# Patient Record
Sex: Male | Born: 1975 | Race: Black or African American | Hispanic: No | Marital: Married | Smoking: Former smoker
Health system: Southern US, Community
[De-identification: ages and names within clinical notes are randomized; demographics above are authoritative.]

## PROBLEM LIST (undated history)

## (undated) DIAGNOSIS — R778 Other specified abnormalities of plasma proteins: Secondary | ICD-10-CM

## (undated) DIAGNOSIS — N182 Chronic kidney disease, stage 2 (mild): Secondary | ICD-10-CM

## (undated) DIAGNOSIS — E119 Type 2 diabetes mellitus without complications: Secondary | ICD-10-CM

## (undated) DIAGNOSIS — Z9989 Dependence on other enabling machines and devices: Secondary | ICD-10-CM

## (undated) DIAGNOSIS — Z888 Allergy status to other drugs, medicaments and biological substances status: Secondary | ICD-10-CM

## (undated) DIAGNOSIS — I472 Ventricular tachycardia, unspecified: Secondary | ICD-10-CM

## (undated) DIAGNOSIS — F32A Depression, unspecified: Secondary | ICD-10-CM

## (undated) DIAGNOSIS — J45909 Unspecified asthma, uncomplicated: Secondary | ICD-10-CM

## (undated) DIAGNOSIS — E7800 Pure hypercholesterolemia, unspecified: Secondary | ICD-10-CM

## (undated) DIAGNOSIS — I10 Essential (primary) hypertension: Secondary | ICD-10-CM

## (undated) DIAGNOSIS — F419 Anxiety disorder, unspecified: Secondary | ICD-10-CM

## (undated) DIAGNOSIS — I428 Other cardiomyopathies: Secondary | ICD-10-CM

## (undated) DIAGNOSIS — I5022 Chronic systolic (congestive) heart failure: Secondary | ICD-10-CM

## (undated) DIAGNOSIS — N200 Calculus of kidney: Secondary | ICD-10-CM

## (undated) DIAGNOSIS — R7989 Other specified abnormal findings of blood chemistry: Secondary | ICD-10-CM

## (undated) DIAGNOSIS — G4733 Obstructive sleep apnea (adult) (pediatric): Secondary | ICD-10-CM

## (undated) DIAGNOSIS — C649 Malignant neoplasm of unspecified kidney, except renal pelvis: Secondary | ICD-10-CM

## (undated) DIAGNOSIS — I480 Paroxysmal atrial fibrillation: Secondary | ICD-10-CM

## (undated) HISTORY — DX: Depression, unspecified: F32.A

## (undated) HISTORY — DX: Other cardiomyopathies: I42.8

## (undated) HISTORY — DX: Calculus of kidney: N20.0

## (undated) HISTORY — DX: Paroxysmal atrial fibrillation: I48.0

## (undated) HISTORY — PX: VASECTOMY: SHX75

## (undated) NOTE — *Deleted (*Deleted)
***In Progress*** Primary Care Provider:Stroud, Rolm Gala, FNP Primary Cardiologist:P. Tenny Craw, MD  Primary HF Cardiologist: Dr. 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   Admitted to [facility] in 3/16 secondary to atypical chest pain, dyspnea, and mild troponin elevation. Echo during hospitalization shows "severely reduced LV function" - EF not quantified due to poor windows.  Admitted 05/14/15 with CP at rest. [facility] showed normal coronaries and moderately elevated filling pressures with moderately depressed cardiac output. [employer] subcutaneous ICD was implanted  Admitted 9/4 - 06/13/17 with A/C systolic CHF in the setting of new afib. Started on IV furosemide and amiodarone. Pt diuresed and converted to NSR without requiring DCCV. Started on Eliquis for [facility]. Continued on amiodarone on discharge with wean over several weeks.  Admitted 3/27 - 01/01/18 with ICD shock. Electrolytes stable. Echo 12/31/17 with LVEF 25-30%.  Pt admitted 05/15/20 for S-ICD generator change. Course was complicated with acute hypoxic/hypercapnic respiratory failure due to possible flash pulmonary edema. Had traumatic intubation resulting in aspiration and requiring ventilator support and short term pressor support. On 05/16/20 pt stable on vent, off pressors. Pt generator change completed. Covered with ABX with concern for aspiration PNA with plan for Augmentin for 5 day course on discharge.  Presented forpost-hospitalfollow up on 05/29/20. Was feeling good. Reported walking 2.5 miles per day. Denied SOB, edema, orthopnea or PND.  Recently returned to HF clinic on 06/26/20 for pharmacist medication titration. At last visit with MD, amiodarone was decreased to 100 mg daily and carvedilol was increased to 6.25 mg BID. Unfortunately, the  correct dose was not sent to the pharmacy, so he was still taking carvedilol 3.125 mg BID. Overall he was feeling well. No dizziness, lightheadedness, chest pain or palpitations. No SOB/DOE. He was taking torsemide 40 mg daily and had not needed any extra. His weight was stable at home at 320-325 lbs. No LEE, PND or orthopnea. Appetite was good. Followed a low salt diet. Recently started on a prednisone taper by his PCP.   Today he returns to HF clinic for pharmacist medication titration. At last visit with pharmacist, Bidil was increased to 1.5 tablets TID.   Overall feeling ***. Dizziness, lightheadedness, fatigue:  Chest pain or palpitations:  How is your breathing?: *** SOB: Able to complete all ADLs. Activity level ***  Weight at home pounds. Takes furosemide/torsemide/bumex *** mg *** daily.  LEE PND/Orthopnea  Appetite *** Low-salt diet:   Physical Exam Cost/affordability of meds  . Shortness of breath/dyspnea on exertion? {YES [id number]  . Orthopnea/PND? {YES [id number] . Edema? {YES [id number] . Lightheadedness/dizziness? {YES [id number] . Daily weights at home? {YES [id number] . Blood pressure/heart rate monitoring at home? {YES [id number] . Following low-sodium/fluid-restricted diet? {YES NO:22349}  HF Medications: Carvedilol 3.125 mg BID Entresto 97/103 mg BID Spironolactone 12.5 mg daily Bidil 20/37.5 mg 1.5 tablet TID Torsemide 40 mg daily  Has the patient been experiencing any side effects to the medications prescribed?  {YES NO:22349}  Does the patient have any problems obtaining medications due to transportation or finances?   No - Has BCBS Medicaid  Understanding of regimen: {excellent/good/fair/poor:19665} Understanding of indications: {excellent/good/fair/poor:19665} Potential of compliance: {excellent/good/fair/poor:19665} Patient understands to avoid NSAIDs. Patient understands to avoid decongestants.    Pertinent Lab Values: . Serum creatinine ***,  BUN ***, Potassium ***, Sodium ***, BNP ***, Magnesium ***, Digoxin ***  Vital Signs: . Weight: *** (last clinic weight: 326 lb) . Blood pressure: ***  . Heart rate: ***   Assessment: 1. Chronic systolic congestive heart failure: s/p [employer] subcutaneous ICD implant 05/17/15(gen change 8/21 - c/b traumatic intubation). 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  - Echo 12/31/17 LVEF 25-30%, Moderate MR, mild RV dilation, Severe RAE, Mod TR, Pa peak pressure 67 mm Hg.  - NYHA II, euvolemic on exam - Continue torsemide 40 mg daily  -Continue*** carvedilol ***3.125 mg BID.  - Continue Entresto 97/103 mg BID. Watch closely with solitary kidney.  - Continue spironolactone 12.5 daily - Continue Bidil 20-37.5 mg 1.5 tab TID  - Was asked for him to speak with PCP re addition of Farxiga, but he forgot at his most recent visit.***  2. Paroxysmal Afib - New diagnosis 06/2017. - Maintaining NSR - Continue Eliquis 5 mg BID.  -Continue amiodarone 100 daily.   - Will need yearly eye exams.  3. 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  No syncope or palpitations. 4. Hypertension:  - Stable.  5. CKD stage III -creatinine1.5-1.7.  6. OSA:  - Back on CPAP.  7. Tobacco use: - Former smoker. No change.  8. Gout:  - Continue allopurinol 200 daily for prophylaxis. No change.  9. DM2:  - Stable. Per PCP. 10.Renal Cell Carcinoma - S/P nephrectomy 2014.  Follow closely in setting of solitary kidney.  - Follows at [facility] and has follow up next month.  11. Morbid Obesity -Body mass index is 51.41 kg/m.   Plan: 1) Medication changes: Based on clinical presentation, vital signs and recent labs will *** 2) Labs: *** 3) Follow-up: ***   Karle Plumber, PharmD, BCPS, BCCP, CPP Heart Failure Clinic Pharmacist [phone]

## (undated) NOTE — *Deleted (*Deleted)
***In-Progress*** Primary Care Provider:Stroud, Rolm Gala, FNP Primary Cardiologist:P. 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   Admitted to [facility] in 3/16 secondary to atypical chest pain, dyspnea, and mild troponin elevation. Echo during hospitalization shows "severely reduced LV function" - EF not quantified due to poor windows.  Admitted 05/14/15 with CP at rest. [facility] showed normal coronaries and moderately elevated filling pressures with moderately depressed cardiac output. [employer] subcutaneous ICD was implanted  Admitted 9/4 - 06/13/17 with A/C systolic CHF in the setting of new afib. Started on IV furosemide and amiodarone. Patient diuresed and converted to NSR without requiring DCCV. Started on Eliquis for anticoagulation. Continued on amiodarone at discharge with wean over several weeks.  Admitted 3/27 - 01/01/18 with ICD shock. Electrolytes stable. Echo 12/31/17 with LVEF 25-30%.  Patient admitted 05/15/20 for S-ICD generator change. Course was complicated with acute hypoxic/hypercapnic respiratory failure due to possible flash pulmonary edema. Had traumatic intubation resulting in aspiration and requiring ventilator support and short term pressor support. On 05/16/20 patient stable on vent, off pressors. Patient generator change completed. Covered with ABX with concern for aspiration PNA with plan for Augmentin for 5 day course on discharge.  Recently returned forpost-hospitalfollow up on 05/29/20 with Dr. Jones Broom. Was feeling good. Reported walking 2.5 miles per day. Denied SOB, edema, orthopnea or PND.  Today he returns to HF clinic for pharmacist medication titration. At last visit with pharmacy clinic 06/26/20, BiDil was increased to 1.5 tablets TID.    Overall feeling ***. Dizziness, lightheadedness, fatigue:  Chest pain or palpitations.  How is your breathing?: *** SOB Able to complete all ADLs. Activity level ***  Weight at home pounds. Takes furosemide/torsemide/bumex *** mg *** daily.  PND/Orthopnea:   Appetite ***.   HF Medications: Carvedilol 3.125 mg BID Entresto 97/103 mg BID Spironolactone 12.5 mg daily BiDil 20-37.5 mg 1.5 tablets TID Torsemide 40 mg daily  ***Has the patient been experiencing any side effects to the medications prescribed?  no  Does the patient have any problems obtaining medications due to transportation or finances?   No - Has BCBS Medicaid  Understanding of regimen: good Understanding of indications: good Potential of compliance: good Patient understands to avoid NSAIDs. Patient understands to avoid decongestants.   Pertinent Lab Values 05/17/20:  Serum creatinine 1.54, BUN 25, Potassium 4.1, Sodium 139  Vital Signs:  Weight:  lbs (last clinic weight: 326 lbs)  Blood pressure:   Heart rate:    Assessment: 1. Chronic systolic congestive heart failure: s/p [employer] subcutaneous ICD implant 05/17/15(gen change 8/21 - c/b traumatic intubation). 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  - Echo 12/31/17 LVEF 25-30%, Moderate MR, mild RV dilation, Severe RAE, Mod TR, Pa peak pressure 67 mm Hg.  - Echo 06/06/20 LVEF 20-25%, LV with global HK. RV function normal - NYHA II, euvolemic on exam - Continue torsemide 40 mg daily  -Continue carvedilol 3.125 mg BID. - Continue Entresto 97/103 mg BID. Watch closely with solitary kidney.  - Continue spironolactone 12.5 daily - Continue Bidil 20-37.5 mg to 1.5 tab TID  - Was asked to speak with PCP regarding addition of Farxiga, but he forgot at his most recent visit***  2. Paroxysmal Afib - New diagnosis 06/2017. - Maintaining NSR - Continue Eliquis 5 mg BID.  -Continue amiodarone 100  daily.   - Will need yearly eye  exams.  3. VT with ICD shock 12/29/17 -Continue amiodarone 100 mg daily.  No syncope or palpitations. 4. Hypertension***:  - Stable.  5. CKD stage III -creatinine1.5-1.7.  6. OSA:  - Back on CPAP.  7. Tobacco use: - Former smoker. No change.  8. Gout:  - Continue allopurinol 200 daily for prophylaxis. No change.  9. DM2:  - Stable. Per PCP. 10. Renal Cell Carcinoma - S/P nephrectomy 2014.  -Follow closely in setting of solitary kidney.  - Follows at [facility] and has follow up next month.  11. Morbid Obesity -Body mass index is 51.41 kg/m.   Plan: 1) Medication changes: Based on clinical presentation, vital signs and recent labs will  *** 2) Follow-up in 4 weeks with Pharmacy Clinic

---

## 1999-04-18 ENCOUNTER — Emergency Department (HOSPITAL_COMMUNITY): Admission: EM | Admit: 1999-04-18 | Discharge: 1999-04-18 | Payer: Self-pay | Admitting: Emergency Medicine

## 2001-07-12 ENCOUNTER — Emergency Department (HOSPITAL_COMMUNITY): Admission: EM | Admit: 2001-07-12 | Discharge: 2001-07-12 | Payer: Self-pay | Admitting: Emergency Medicine

## 2004-01-16 ENCOUNTER — Observation Stay (HOSPITAL_COMMUNITY): Admission: EM | Admit: 2004-01-16 | Discharge: 2004-01-17 | Payer: Self-pay | Admitting: Family Medicine

## 2004-07-05 HISTORY — PX: APPENDECTOMY: SHX54

## 2004-07-11 ENCOUNTER — Encounter (INDEPENDENT_AMBULATORY_CARE_PROVIDER_SITE_OTHER): Payer: Self-pay | Admitting: Specialist

## 2004-07-11 ENCOUNTER — Inpatient Hospital Stay (HOSPITAL_COMMUNITY): Admission: EM | Admit: 2004-07-11 | Discharge: 2004-07-24 | Payer: Self-pay | Admitting: Emergency Medicine

## 2004-08-03 ENCOUNTER — Emergency Department (HOSPITAL_COMMUNITY): Admission: EM | Admit: 2004-08-03 | Discharge: 2004-08-04 | Payer: Self-pay | Admitting: Emergency Medicine

## 2007-09-09 ENCOUNTER — Emergency Department (HOSPITAL_COMMUNITY): Admission: EM | Admit: 2007-09-09 | Discharge: 2007-09-09 | Payer: Self-pay | Admitting: Emergency Medicine

## 2007-10-17 ENCOUNTER — Emergency Department (HOSPITAL_COMMUNITY): Admission: EM | Admit: 2007-10-17 | Discharge: 2007-10-17 | Payer: Self-pay | Admitting: Emergency Medicine

## 2008-02-03 ENCOUNTER — Emergency Department (HOSPITAL_COMMUNITY): Admission: EM | Admit: 2008-02-03 | Discharge: 2008-02-04 | Payer: Self-pay | Admitting: Emergency Medicine

## 2009-11-12 ENCOUNTER — Emergency Department (HOSPITAL_COMMUNITY): Admission: EM | Admit: 2009-11-12 | Discharge: 2009-11-13 | Payer: Self-pay | Admitting: Emergency Medicine

## 2010-04-26 ENCOUNTER — Emergency Department (HOSPITAL_COMMUNITY): Admission: EM | Admit: 2010-04-26 | Discharge: 2010-04-26 | Payer: Self-pay | Admitting: Emergency Medicine

## 2010-10-15 ENCOUNTER — Inpatient Hospital Stay (HOSPITAL_COMMUNITY)
Admission: EM | Admit: 2010-10-15 | Discharge: 2010-10-18 | Payer: Self-pay | Source: Home / Self Care | Attending: Internal Medicine | Admitting: Internal Medicine

## 2010-10-15 ENCOUNTER — Encounter (INDEPENDENT_AMBULATORY_CARE_PROVIDER_SITE_OTHER): Payer: Self-pay | Admitting: Nurse Practitioner

## 2010-10-15 LAB — CONVERTED CEMR LAB
Hgb A1c MFr Bld: 6 %
TSH: 2.961 u[IU]/mL

## 2010-10-16 ENCOUNTER — Encounter (INDEPENDENT_AMBULATORY_CARE_PROVIDER_SITE_OTHER): Payer: Self-pay | Admitting: Nurse Practitioner

## 2010-10-16 ENCOUNTER — Encounter (INDEPENDENT_AMBULATORY_CARE_PROVIDER_SITE_OTHER): Payer: Self-pay | Admitting: Internal Medicine

## 2010-10-16 LAB — CONVERTED CEMR LAB
ALT: 90 units/L
AST: 38 units/L
Albumin: 3.1 g/dL
Alkaline Phosphatase: 41 units/L
BUN: 15 mg/dL
CO2: 25 meq/L
Calcium: 9.3 mg/dL
Chloride: 106 meq/L
Cholesterol: 167 mg/dL
Creatinine, Ser: 1.41 mg/dL
Glucose, Bld: 114 mg/dL
HDL: 27 mg/dL
LDL Cholesterol: 106 mg/dL
Potassium: 4.2 meq/L
Sodium: 139 meq/L
Total Bilirubin: 0.8 mg/dL
Total CHOL/HDL Ratio: 6.2
Total Protein: 6.4 g/dL
Triglycerides: 172 mg/dL
VLDL: 34 mg/dL

## 2010-10-20 LAB — COMPREHENSIVE METABOLIC PANEL
ALT: 113 U/L — ABNORMAL HIGH (ref 0–53)
ALT: 90 U/L — ABNORMAL HIGH (ref 0–53)
AST: 79 U/L — ABNORMAL HIGH (ref 0–37)
AST: 38 U/L — ABNORMAL HIGH (ref 0–37)
Albumin: 3.1 g/dL — ABNORMAL LOW (ref 3.5–5.2)
Albumin: 3.1 g/dL — ABNORMAL LOW (ref 3.5–5.2)
Alkaline Phosphatase: 37 U/L — ABNORMAL LOW (ref 39–117)
Alkaline Phosphatase: 41 U/L (ref 39–117)
BUN: 17 mg/dL (ref 6–23)
BUN: 15 mg/dL (ref 6–23)
CO2: 25 mEq/L (ref 19–32)
CO2: 25 mEq/L (ref 19–32)
Calcium: 9.1 mg/dL (ref 8.4–10.5)
Calcium: 9.3 mg/dL (ref 8.4–10.5)
Chloride: 108 mEq/L (ref 96–112)
Chloride: 106 mEq/L (ref 96–112)
Creatinine, Ser: 1.4 mg/dL (ref 0.4–1.5)
Creatinine, Ser: 1.41 mg/dL (ref 0.4–1.5)
GFR calc Af Amer: >60 mL/min (ref >60)
GFR calc Af Amer: >60 mL/min (ref >60)
GFR calc non Af Amer: 58 mL/min — ABNORMAL LOW (ref >60)
GFR calc non Af Amer: 57 mL/min — ABNORMAL LOW (ref >60)
Glucose, Bld: 108 mg/dL — ABNORMAL HIGH (ref 70–99)
Glucose, Bld: 114 mg/dL — ABNORMAL HIGH (ref 70–99)
Potassium: 4.3 mEq/L (ref 3.5–5.1)
Potassium: 4.2 mEq/L (ref 3.5–5.1)
Sodium: 141 mEq/L (ref 135–145)
Sodium: 139 mEq/L (ref 135–145)
Total Bilirubin: 1 mg/dL (ref 0.3–1.2)
Total Bilirubin: 0.8 mg/dL (ref 0.3–1.2)
Total Protein: 6.3 g/dL (ref 6.0–8.3)
Total Protein: 6.4 g/dL (ref 6.0–8.3)

## 2010-10-20 LAB — URINALYSIS, ROUTINE W REFLEX MICROSCOPIC
Bilirubin Urine: NEGATIVE
Hgb urine dipstick: NEGATIVE
Ketones, ur: NEGATIVE mg/dL
Leukocytes, UA: NEGATIVE
Nitrite: NEGATIVE
Protein, ur: 100 mg/dL — AB
Specific Gravity, Urine: 1.024 (ref 1.005–1.030)
Urine Glucose, Fasting: NEGATIVE mg/dL
Urobilinogen, UA: 0.2 mg/dL (ref 0.0–1.0)
pH: 5.5 (ref 5.0–8.0)

## 2010-10-20 LAB — DIFFERENTIAL
Basophils Absolute: 0 10*3/uL (ref 0.0–0.1)
Basophils Relative: 1 % (ref 0–1)
Eosinophils Absolute: 0.2 10*3/uL (ref 0.0–0.7)
Eosinophils Relative: 3 % (ref 0–5)
Lymphocytes Relative: 41 % (ref 12–46)
Lymphs Abs: 3.5 10*3/uL (ref 0.7–4.0)
Monocytes Absolute: 0.6 10*3/uL (ref 0.1–1.0)
Monocytes Relative: 7 % (ref 3–12)
Neutro Abs: 4.3 10*3/uL (ref 1.7–7.7)
Neutrophils Relative %: 49 % (ref 43–77)

## 2010-10-20 LAB — TSH: TSH: 2.961 u[IU]/mL (ref 0.350–4.500)

## 2010-10-20 LAB — BASIC METABOLIC PANEL
BUN: 15 mg/dL (ref 6–23)
CO2: 22 mEq/L (ref 19–32)
Calcium: 9 mg/dL (ref 8.4–10.5)
Chloride: 112 mEq/L (ref 96–112)
Creatinine, Ser: 1.32 mg/dL (ref 0.4–1.5)
GFR calc Af Amer: >60 mL/min (ref >60)
GFR calc non Af Amer: >60 mL/min (ref >60)
Glucose, Bld: 111 mg/dL — ABNORMAL HIGH (ref 70–99)
Potassium: 4.2 mEq/L (ref 3.5–5.1)
Sodium: 140 mEq/L (ref 135–145)

## 2010-10-20 LAB — BRAIN NATRIURETIC PEPTIDE
Pro B Natriuretic peptide (BNP): 644 pg/mL — ABNORMAL HIGH (ref 0.0–100.0)
Pro B Natriuretic peptide (BNP): 373 pg/mL — ABNORMAL HIGH (ref 0.0–100.0)
Pro B Natriuretic peptide (BNP): 386 pg/mL — ABNORMAL HIGH (ref 0.0–100.0)

## 2010-10-20 LAB — HEMOGLOBIN A1C
Hgb A1c MFr Bld: 6 % — ABNORMAL HIGH (ref <5.7)
Mean Plasma Glucose: 126 mg/dL — ABNORMAL HIGH (ref <117)

## 2010-10-20 LAB — CARDIAC PANEL(CRET KIN+CKTOT+MB+TROPI)
CK, MB: 1.7 ng/mL (ref 0.3–4.0)
CK, MB: 1.6 ng/mL (ref 0.3–4.0)
Relative Index: 1 (ref 0.0–2.5)
Relative Index: 1 (ref 0.0–2.5)
Total CK: 162 U/L (ref 7–232)
Total CK: 166 U/L (ref 7–232)
Troponin I: 0.06 ng/mL (ref 0.00–0.06)
Troponin I: 0.08 ng/mL — ABNORMAL HIGH (ref 0.00–0.06)

## 2010-10-20 LAB — CBC
HCT: 43.8 % (ref 39.0–52.0)
HCT: 42.2 % (ref 39.0–52.0)
Hemoglobin: 14.5 g/dL (ref 13.0–17.0)
Hemoglobin: 14.2 g/dL (ref 13.0–17.0)
MCH: 27.9 pg (ref 26.0–34.0)
MCH: 28.2 pg (ref 26.0–34.0)
MCHC: 33.1 g/dL (ref 30.0–36.0)
MCHC: 33.6 g/dL (ref 30.0–36.0)
MCV: 84.2 fL (ref 78.0–100.0)
MCV: 83.9 fL (ref 78.0–100.0)
Platelets: 259 10*3/uL (ref 150–400)
Platelets: 260 10*3/uL (ref 150–400)
RBC: 5.2 MIL/uL (ref 4.22–5.81)
RBC: 5.03 MIL/uL (ref 4.22–5.81)
RDW: 14 % (ref 11.5–15.5)
RDW: 14 % (ref 11.5–15.5)
WBC: 8.6 10*3/uL (ref 4.0–10.5)
WBC: 10 10*3/uL (ref 4.0–10.5)

## 2010-10-20 LAB — RAPID URINE DRUG SCREEN, HOSP PERFORMED
Amphetamines: NOT DETECTED
Barbiturates: NOT DETECTED
Benzodiazepines: NOT DETECTED
Cocaine: POSITIVE — AB
Opiates: NOT DETECTED
Tetrahydrocannabinol: NOT DETECTED

## 2010-10-20 LAB — URINE MICROSCOPIC-ADD ON

## 2010-10-20 LAB — LIPID PANEL
Cholesterol: 167 mg/dL (ref 0–200)
HDL: 27 mg/dL — ABNORMAL LOW (ref >39)
LDL Cholesterol: 106 mg/dL — ABNORMAL HIGH (ref 0–99)
Total CHOL/HDL Ratio: 6.2 RATIO
Triglycerides: 172 mg/dL — ABNORMAL HIGH (ref <150)
VLDL: 34 mg/dL (ref 0–40)

## 2010-10-20 LAB — POCT CARDIAC MARKERS
CKMB, poc: 1.6 ng/mL (ref 1.0–8.0)
Myoglobin, poc: 67.8 ng/mL (ref 12–200)
Troponin i, poc: <0.05 ng/mL (ref 0.00–0.09)

## 2010-10-20 LAB — GLUCOSE, CAPILLARY: Glucose-Capillary: 116 mg/dL — ABNORMAL HIGH (ref 70–99)

## 2010-11-16 NOTE — H&P (Signed)
Jeffrey [name], Jeffrey [name]                  ACCOUNT NO.:  [id number]  MEDICAL RECORD NO.:  [id number]          PATIENT TYPE:  INP  LOCATION:  0110                         FACILITY:  [facility]  PHYSICIAN:  Marcellus Scott, MD     DATE OF BIRTH:  04/04/1976  DATE OF ADMISSION:  10/15/2010 DATE OF DISCHARGE:                             HISTORY & PHYSICAL   CHIEF COMPLAINT:  Shortness of breath.  HISTORY OF PRESENT ILLNESS:  This is an obese African American 35 year old male with a history of untreated hypertension who reports that he has been chronically short of breath for months but ignored it thinking that he was just fat.  However, over the past 4 days, he has been having shortness of breath even lying still and is unable to get up and walk across a room secondary to dyspnea.  He denies any chest pain.  He endorses occasional palpitations that he has had for years.  Denies any nausea, vomiting, changes in his bowel habits, fever or sore throat.  He does endorse a hacking nonproductive cough.  Denies PND.  Denies orthopnea.  He tells me that he has been told by his girlfriend that he occasionally stops breathing while he is sleeping.  PAST MEDICAL HISTORY:  Significant for: 1. Hypertension. 2. History of polysubstance abuse.  PAST SURGICAL HISTORY:  Surgeries have included appendectomy of a perforated abdomen with peritonitis.  He also had a postop abscess with partial small-bowel obstructions.  HOME MEDICATIONS:  None.  ALLERGIES:  Aspirin causes itching and rash.  REVIEW OF SYSTEMS:  Pertinent for pain in his antecubital area bilaterally.  Otherwise all systems were reviewed and found to be negative.  SOCIAL HISTORY:  He quit smoking 1-1/2 months ago but has a 10-pack-year history.  He tells me he used to drink on weekends.  He would drink 3 or 4 brandies on Friday and Saturday night but he has not had any alcohol in 3 weeks.  He tells me he last smoked marijuana over 2 years ago  and denies ever having used cocaine, although there is a positive UDS for cocaine in the e-chart.  He has 12 children.  FAMILY HISTORY:  Significant for his mother who is alive but was recently diagnosed with cancer; he is unsure what type.  His father who is alive and healthy.  He has one aunt and one uncle who both have had cardiomegaly and hypertension and are deceased from diverticulitis.  He has one sister who is healthy.  PHYSICAL EXAMINATION:  GENERAL:  This is a well-developed, obese, pleasant African American male lying in minimal distress in the [facility].   Vital Signs: T 98.4, BP: 159/137 mmHg on arrival but later 132/90, Pulse 91/min, Resp 22/min, O2 sats 97% on room air. He is alert and oriented x3.  HEENT:  Head is atraumatic, normocephalic.  Eyes are anicteric with pupils that are equal, round, reactive to light.  His nose shows no nasal discharge or exterior lesions.  Mouth has moist mucous membranes with good dentition.  NECK: Supple with midline trachea.  No JVD.  No lymphadenopathy.  CHEST:  Mild work of breathing without wheezes, crackles or rales.  HEART:  Regular rate and rhythm without murmurs, rubs or gallops.  ABDOMEN:  Obese, nontender.  He has a well-healed scar just over his umbilicus, I am unable to palpate any masses or organomegaly.  EXTREMITIES:  Lower extremities show 1+ pitting edema bilaterally.  He has 5/5 strength in each extremity. SKIN:  Shows no rashes, bruises or lesions.  NEUROLOGIC:  Cranial nerves II-XII are grossly intact.   He has no facial asymmetries, no obvious focal neuro deficits.  PSYCHIATRIC:  The patient is alert and oriented. His demeanor is pleasant, cooperative.  His grooming is good.  PERTINENT LABORATORY AND X-RAY DATA:  BNP is 644.  Sodium 140, potassium 4.2, chloride 112, bicarb 22, glucose 111, BUN 15, creatinine 1.32, calcium 9.0. Cardiac enzymes, point-of-care markers, troponin I is 0.05, CK-MB 1.6, myoglobin 67.8.   Two-view chest x-ray shows: 1. Enlargement of cardiac silhouette with pulmonary vascular     congestion. 2. Peribronchial thickening with mild hazy interstitial prominence.  ASSESSMENT:  Dr.  Marcellus Scott has seen and examined the patient, collected history, reviewed his chart and spoken at length with the patient as well as the PA about case. 1. This is a 35 year old male with acute onset of dyspnea even at     rest.  Differential diagnosis includes:  Congestive heart failure,     chronic obstructive pulmonary disease, sleep apnea, pneumonia,     which I doubt at this point. 2. The patient has hypertension, uncontrolled. 3. Obesity. 4. The patient has no PCP or insurance.  PLAN: 1. With regards to his dyspnea, we will diurese him with IV Lasix.  To     gauge the function of his heart, we will check a 2-D echo.  Will     also cycle cardiac enzymes to rule out acute coronary syndrome.     The patient is unable to take aspirin.  We may consider Plavix     based on the results of his 2-D echo and after discussions with the     cardiologist.  Will also ask for daily weights, strict I's and O's,     heart healthy diet and CHF education.  Finally, we will check a     chest x-ray in the morning to see if his vascular congestion is     improved on Lasix. 2. With regards to his hypertension, we are obtaining a UDS.  If the     results are negative for cocaine use, we will start metoprolol low     dose, 12.5 mg b.i.d.  We will consider ARB or ACE inhibitor later     in the hospitalization. his Bp's are better after sublingual Nitroglycerin. 3. For his recent polysubstance abuse, we will ask for tobacco and     substance abuse cessation counseling. 4. Prior to discharge, we will ask for social work assistance in     obtaining a PCP for this patient. 5. This patient is a full code. 6. Rule out obstructive sleep apnea,consider outpatient sleep study.  Dictated For:  Marcellus Scott,  MD     Stephani Police, [place]   ______________________________ Marcellus Scott, MD    MLY/MEDQ  D:  10/15/2010  T:  10/15/2010  Job:  [id number]  Electronically Signed by Algis Downs PA on 10/21/2010 82:95:62 PM Electronically Signed by Marcellus Scott MD on 11/16/2010 05:54:03 PM

## 2010-12-20 LAB — URINALYSIS, ROUTINE W REFLEX MICROSCOPIC
Bilirubin Urine: NEGATIVE
Glucose, UA: NEGATIVE mg/dL
Ketones, ur: NEGATIVE mg/dL
Leukocytes, UA: NEGATIVE
Nitrite: NEGATIVE
Protein, ur: 30 mg/dL — AB
Specific Gravity, Urine: 1.023 (ref 1.005–1.030)
Urobilinogen, UA: 0.2 mg/dL (ref 0.0–1.0)
pH: 6 (ref 5.0–8.0)

## 2010-12-20 LAB — URINE MICROSCOPIC-ADD ON

## 2010-12-23 ENCOUNTER — Encounter (INDEPENDENT_AMBULATORY_CARE_PROVIDER_SITE_OTHER): Payer: Self-pay | Admitting: Nurse Practitioner

## 2010-12-23 ENCOUNTER — Encounter: Payer: Self-pay | Admitting: Nurse Practitioner

## 2010-12-23 LAB — CONVERTED CEMR LAB
ALT: 33 units/L (ref 0–53)
AST: 23 units/L (ref 0–37)
Albumin: 4.6 g/dL (ref 3.5–5.2)
Alkaline Phosphatase: 45 units/L (ref 39–117)
BUN: 16 mg/dL (ref 6–23)
CO2: 25 meq/L (ref 19–32)
Calcium: 10 mg/dL (ref 8.4–10.5)
Chloride: 105 meq/L (ref 96–112)
Cholesterol, target level: 200 mg/dL
Creatinine, Ser: 0.94 mg/dL (ref 0.40–1.50)
Glucose, Bld: 127 mg/dL — ABNORMAL HIGH (ref 70–99)
HDL goal, serum: 40 mg/dL
LDL Goal: 130 mg/dL
Potassium: 4.6 meq/L (ref 3.5–5.3)
Pro B Natriuretic peptide (BNP): 28.7 pg/mL (ref 0.0–100.0)
Sodium: 139 meq/L (ref 135–145)
Total Bilirubin: 0.5 mg/dL (ref 0.3–1.2)
Total Protein: 8.1 g/dL (ref 6.0–8.3)

## 2010-12-24 ENCOUNTER — Encounter (INDEPENDENT_AMBULATORY_CARE_PROVIDER_SITE_OTHER): Payer: Self-pay | Admitting: Nurse Practitioner

## 2010-12-24 LAB — POCT I-STAT, CHEM 8
BUN: 18 mg/dL (ref 6–23)
Calcium, Ion: 1.11 mmol/L — ABNORMAL LOW (ref 1.12–1.32)
Chloride: 107 mEq/L (ref 96–112)
Creatinine, Ser: 1.3 mg/dL (ref 0.4–1.5)
Glucose, Bld: 97 mg/dL (ref 70–99)
HCT: 51 % (ref 39.0–52.0)
Hemoglobin: 17.3 g/dL — ABNORMAL HIGH (ref 13.0–17.0)
Potassium: 3.9 mEq/L (ref 3.5–5.1)
Sodium: 138 mEq/L (ref 135–145)
TCO2: 25 mmol/L (ref 0–100)

## 2010-12-24 LAB — POCT CARDIAC MARKERS
CKMB, poc: <1 ng/mL — ABNORMAL LOW (ref 1.0–8.0)
Myoglobin, poc: 63.7 ng/mL (ref 12–200)
Troponin i, poc: <0.05 ng/mL (ref 0.00–0.09)

## 2010-12-24 LAB — CBC
HCT: 46.5 % (ref 39.0–52.0)
Hemoglobin: 15.9 g/dL (ref 13.0–17.0)
MCHC: 34.3 g/dL (ref 30.0–36.0)
MCV: 89.8 fL (ref 78.0–100.0)
Platelets: 245 10*3/uL (ref 150–400)
RBC: 5.18 MIL/uL (ref 4.22–5.81)
RDW: 14.5 % (ref 11.5–15.5)
WBC: 9.8 10*3/uL (ref 4.0–10.5)

## 2010-12-25 DIAGNOSIS — E785 Hyperlipidemia, unspecified: Secondary | ICD-10-CM

## 2010-12-25 DIAGNOSIS — E1159 Type 2 diabetes mellitus with other circulatory complications: Secondary | ICD-10-CM

## 2010-12-25 DIAGNOSIS — E1169 Type 2 diabetes mellitus with other specified complication: Secondary | ICD-10-CM | POA: Insufficient documentation

## 2010-12-25 DIAGNOSIS — I10 Essential (primary) hypertension: Secondary | ICD-10-CM | POA: Insufficient documentation

## 2011-01-01 NOTE — Letter (Signed)
Summary: *HSN Results Follow up  [facility]  [address] [place], [place] [address]   Phone: [phone]  Fax: [phone]      12/24/2010   RANSOM NICKSON [address] [place], [place]  [address]   Dear  Mr. BLESSING OZGA,                            ____S.Drinkard,FNP   ____D. Gore,FNP       ____B. McPherson,MD   ____V. Rankins,MD    ____E. Mulberry,MD    __X__N. Daphine Deutscher, FNP  ____D. Reche Dixon, MD    ____K. Philipp Deputy, MD    ____Other     This letter is to inform you that your recent test(s):  _______Pap Smear    ___X____Lab Test     _______X-ray    ___X____ is within acceptable limits  _______ requires a medication change  _______ requires a follow-up lab visit  _______ requires a follow-up visit with your provider   Comments: Labs done during recent office visit were normal.  Keep up the good work.       _________________________________________________________ If you have any questions, please contact our office                     Sincerely,    Lehman Prom FNP [facility]

## 2011-01-01 NOTE — Assessment & Plan Note (Signed)
Summary: New - Establish Care   Vital Signs:  Patient profile:   35 year old male Height:      68.50 inches Weight:      306.6 pounds BMI:     46.11 O2 Sat:      96 % on Room air Temp:     97.4 degrees F oral Pulse rate:   75 / minute Pulse rhythm:   regular Resp:     20 per minute BP sitting:   120 / 86  (left arm) Cuff size:   regular  Vitals Entered By: Levon Hedger (December 23, 2010 11:36 AM)  Nutrition Counseling: Patient's BMI is greater than 25 and therefore counseled on weight management options.  O2 Flow:  Room air CC: follow-up visit [facility], CHF Management, Hypertension Management, Lipid Management Is Patient Diabetic? No Pain Assessment Patient in pain? no       Does patient need assistance? Functional Status Self care Ambulation Normal   CC:  follow-up visit [place], CHF Management, Hypertension Management, and Lipid Management.  History of Present Illness:  Pt into the office to establish care. Hospital D/C 10/15/2010 to 10/18/2010 (full d/c reviewed)  HTN - markedly elevated bp upon arrival to the ER.  He was treated in the ER and started on clonidine.  Therapy changed ad pt was stable on lopressor and lisinopril at time of D/C  CHF - treated with diuretics.  BNP 386.  Marginally high lipid profile - triglycerides 157 and ld; 1-6.  pt encouraged to change his diet and lose weight.    Borderline diabetic - Hgba1c 6.0.  Diet counseling given in hospital  Social - Due to most recent hospitalization he has had to quit his job  CHF History:      He denies headache, chest pain, palpitations, and peripheral edema.  Daily weights are not being checked.  He understands fluid management and sodium restriction and is following this regimen.  The patient expresses understanding of the treatment plan and his medications.  He admits to being compliant with his medications.  ADL's are being done without symptoms or restrictions.  The patient has not been  enrolled in the CHF Eduction Program.    Hypertension History:      He denies headache, chest pain, and palpitations.  He notes no problems with any antihypertensive medication side effects.        Positive major cardiovascular risk factors include hyperlipidemia and hypertension.  Negative major cardiovascular risk factors include male age less than 75 years old, no history of diabetes, and non-tobacco-user status.        Positive history for target organ damage include cardiac end organ damage (either CHF or LVH).  Further assessment for target organ damage reveals no history of stroke/TIA, peripheral vascular disease, renal insufficiency, or hypertensive retinopathy.    Lipid Management History:      Positive NCEP/ATP III risk factors include HDL cholesterol less than 40 and hypertension.  Negative NCEP/ATP III risk factors include male age less than 70 years old, non-diabetic, non-tobacco-user status, no prior stroke/TIA, no peripheral vascular disease, and no history of aortic aneurysm.        The patient states that he knows about the "Therapeutic Lifestyle Change" diet.  His compliance with the TLC diet is fair.  The patient expresses understanding of adjunctive measures for cholesterol lowering.  Comments include: no current meds.       Habits & Providers  Alcohol-Tobacco-Diet     Tobacco  Status: quit < 6 months     Tobacco Counseling: to quit use of tobacco products  Current Medications (verified): 1)  Lisinopril 10 Mg Tabs (Lisinopril) .... One Tablet By Mouth Daily 2)  Metoprolol Tartrate 50 Mg Tabs (Metoprolol Tartrate) .... One Tablet By Mouth Two Times A Day For Heart 3)  Furosemide 40 Mg Tabs (Furosemide) .... One Tablet By Mouth Two Times A Day For Fluid  Allergies (verified): No Known Drug Allergies  Family History: mother - htn (living) father - unknown  Social History: 10 children tobacco -  ETOH -  Drug - Smoking Status:  quit < 6 months  Review of  Systems General:  Denies fatigue. CV:  Denies chest pain or discomfort and swelling of feet. Resp:  Denies cough. GI:  Denies diarrhea, nausea, and vomiting.  Physical Exam  General:  alert.  obese Head:  normocephalic.   Lungs:  normal breath sounds.   Heart:  normal rate and regular rhythm.   Abdomen:  normal bowel sounds.   Msk:  up to the exam table Neurologic:  alert & oriented X3.   Skin:  color normal.   Psych:  Oriented X3.     Impression & Recommendations:  Problem # 1:  CHF (ICD-428.0) reviewed dx wtih pt His updated medication list for this problem includes:    Lisinopril 10 Mg Tabs (Lisinopril) ..... One tablet by mouth daily    Metoprolol Tartrate 50 Mg Tabs (Metoprolol tartrate) ..... One tablet by mouth two times a day for heart    Furosemide 40 Mg Tabs (Furosemide) ..... One tablet by mouth two times a day for fluid  Orders: T-Comprehensive Metabolic Panel (54098-11914) TLB-BNP (B-Natriuretic Peptide) (83880-BNPR) Pulse Oximetry (single measurment) (78295)  Problem # 2:  HYPERTENSION, BENIGN ESSENTIAL (ICD-401.1)  His updated medication list for this problem includes:    Lisinopril 10 Mg Tabs (Lisinopril) ..... One tablet by mouth daily    Metoprolol Tartrate 50 Mg Tabs (Metoprolol tartrate) ..... One tablet by mouth two times a day for heart    Furosemide 40 Mg Tabs (Furosemide) ..... One tablet by mouth two times a day for fluid  Problem # 3:  HYPERLIPIDEMIA, MILD (ICD-272.4) diet control for now  Problem # 4:  DIABETES MELLITUS, TYPE II, BORDERLINE (ICD-790.29) Hgba1c = 6.9 in January  will recheck in 6 months advised pt to start diabetic diet  Problem # 5:  OBESITY (ICD-278.00) pt to work on wieght loss  Complete Medication List: 1)  Lisinopril 10 Mg Tabs (Lisinopril) .... One tablet by mouth daily 2)  Metoprolol Tartrate 50 Mg Tabs (Metoprolol tartrate) .... One tablet by mouth two times a day for heart 3)  Furosemide 40 Mg Tabs (Furosemide)  .... One tablet by mouth two times a day for fluid  CHF Assessment/Plan:      The patient's current weight is 306.6 pounds.    Prior Cardiac Test Results:  CXR Report:    Date:     10/08/2010    Results:   enlargement of cardiac silhouette with pulmonary vascular congestion  Echocardiogram Report:    Date:     10/15/2010    Results:   moderately dilated left ventricle. wall thickness normal.  Systolic function severely reduced with an EF of 20-25%. Diffuse hypokinesis.  Dopper parameters consistent with grade III diastolic dysfunction.  Right ventricular cavity size mildly dilated, atrium moderately dilated on the left and right atrium mildly dilated.  there is mild mitral valve regurgitation.   Hypertension  Assessment/Plan:      The patient's hypertensive risk group is category C: Target organ damage and/or diabetes.  His calculated 10 year risk of coronary heart disease is 7 %.  Today's blood pressure is 120/86.  His blood pressure goal is < 140/90.  Lipid Assessment/Plan:      Based on NCEP/ATP III, the patient's risk factor category is "2 or more risk factors and a calculated 10 year CAD risk of < 20%".  The patient's lipid goals are as follows: Total cholesterol goal is 200; LDL cholesterol goal is 130; HDL cholesterol goal is 40; Triglyceride goal is 150.    Patient Instructions: 1)  Your labs will be checked today to make sure your kidneys are working well. 2)  Be mindful that your blood sugar is slightly elevated than normal.  You should try monitor your sugar and carbohydrate intake. 3)  Weight - try to make efforts to lower your weight.  [employer] program would be great for you to start. 4)  Follow up 4-6 weeks for CHF follow up. 5)  will need cardiology referral Prescriptions: FUROSEMIDE 40 MG TABS (FUROSEMIDE) One tablet by mouth two times a day for fluid  #60 x 5   Entered and Authorized by:   Lehman Prom FNP   Signed by:   Lehman Prom FNP on 12/23/2010   Method  used:   Print then Give to Patient   RxID:   [id number] METOPROLOL TARTRATE 50 MG TABS (METOPROLOL TARTRATE) One tablet by mouth two times a day for heart  #60 x 5   Entered and Authorized by:   Lehman Prom FNP   Signed by:   Lehman Prom FNP on 12/23/2010   Method used:   Print then Give to Patient   RxID:   [id number] LISINOPRIL 10 MG TABS (LISINOPRIL) One tablet by mouth daily  #30 x 5   Entered and Authorized by:   Lehman Prom FNP   Signed by:   Lehman Prom FNP on 12/23/2010   Method used:   Print then Give to Patient   RxID:   [id number]    Orders Added: 1)  Est. Patient age 19-39 [facility]Abo 2)  T-Comprehensive Metabolic Panel [80053-22900] 3)  TLB-BNP (B-Natriuretic Peptide) [83880-BNPR] 4)  Pulse Oximetry (single measurment) [01027]

## 2011-01-01 NOTE — Progress Notes (Signed)
Summary: Office Visit//DEPRESSION SCREENING  Office Visit//DEPRESSION SCREENING   Imported By: Arta Bruce 12/24/2010 16:29:55  _____________________________________________________________________  External Attachment:    Type:   Image     Comment:   External Document

## 2011-01-01 NOTE — Letter (Signed)
Summary: External Correspondence  External Correspondence   Imported By: Arta Bruce 12/23/2010 08:20:59  _____________________________________________________________________  External Attachment:    Type:   Image     Comment:   External Document

## 2011-02-20 NOTE — H&P (Signed)
Jeffrey [name], Jeffrey [name]                  ACCOUNT NO.:  [id number]   MEDICAL RECORD NO.:  [id number]          PATIENT TYPE:  INP   LOCATION:  0105                         FACILITY:  [facility]   PHYSICIAN:  Sharlet [name] T. Hoxworth, M.D.DATE OF BIRTH:  11/02/1974   DATE OF ADMISSION:  07/11/2004  DATE OF DISCHARGE:                                HISTORY & PHYSICAL   CHIEF COMPLAINT:  Abdominal pain.   HISTORY OF PRESENT ILLNESS:  [name] is a 35 year old black male who  presents with a 16-hour history of abdominal pain.  He describes initially  mid-abdominal pain that has become gradually worse and more diffuse.  It is  constant pain.  It is somewhat worse in the right lower quadrant than the  remainder of his abdomen but hurts everywhere.  He has had nausea and some  vomiting.  He had a normal bowel movement this morning.  Denies any urinary  symptoms.  The pain is worse with any motion.  No fever or chills.  The  patient denies any chronic abdominal pain or GI complaints.  He does,  however, have a history of admission to [facility] in April 2005  with what he said was similar but not as severe pain.  He had a negative CT  scan at that time, was observed, and the pain resolved and he has been well  until the current illness.   PAST MEDICAL HISTORY:  Only significant for hospitalization for abdominal  pain as above, otherwise no previous medical surgery or illnesses.   MEDICATIONS:  None.   ALLERGIES:  ASPIRIN as a child.   SOCIAL HISTORY:  Positive cigarettes, occasional alcohol, occasional  marijuana.  He is single.   FAMILY HISTORY:  Noncontributory.   REVIEW OF SYSTEMS:  GENERAL:  No fever, chills, weight gain.  RESPIRATORY:  No shortness of breath, cough, wheezing, history of asthma.  CARDIAC:  No  chest pain, palpitations, history of heart disease.  ABDOMEN/GI:  As above.  GU:  No urinary burning or frequency.   PHYSICAL EXAMINATION:  VITAL SIGNS:  Temperature 99.7, pulse  78,  respirations 20, blood pressure 155/105.  GENERAL:  He is a mildly obese black male who appears in severe pain.  SKIN:  Warm and dry.  HEENT:  No masses or thyromegaly.  Sclerae nonicteric.  Nares and oropharynx  clear.  LUNGS:  Clear to auscultation without increased work of breathing.  CARDIAC:  Regular rhythm without murmurs.  No edema.  ABDOMEN:  Bowel sounds hypoactive.  There is diffuse tenderness and guarding  but somewhat worse in the right lower quadrant.  Positive peritoneal signs.  EXTREMITIES:  No joint swelling or deformity.  NEUROLOGIC:  Alert, oriented.  Motor and sensory exams grossly normal.   LABORATORY DATA:  Urinalysis shows 30-50 red cells but this is following a  somewhat traumatic Foley placement as he was unable to void.  White count is  elevated at 25,900, hemoglobin 16.5, platelets 311.  Electrolytes normal.   ASSESSMENT AND PLAN:  Acute abdomen with evidence of peritonitis.  This is  consistent  with possibly perforated appendicitis.  The patient is being  treated with IV fluids, broad-spectrum antibiotics, and will be take  urgently to the operating room for laparoscopy and appendectomy.      BTH/MEDQ  D:  07/11/2004  T:  07/11/2004  Job:  [id number]

## 2011-02-20 NOTE — Discharge Summary (Signed)
[name], Jeffrey                  ACCOUNT NO.:  [id number]   MEDICAL RECORD NO.:  [id number]          PATIENT TYPE:  INP   LOCATION:  0451                         FACILITY:  [facility]   PHYSICIAN:  Sharlet [name] T. Hoxworth, M.D.DATE OF BIRTH:  11/02/1974   DATE OF ADMISSION:  07/11/2004  DATE OF DISCHARGE:  07/24/2004                                 DISCHARGE SUMMARY   DISCHARGE DIAGNOSES:  1.  Perforated appendicitis with peritonitis.  2.  Postoperative abscessed with partial small bowel obstruction.  3.  New diagnosis of hypertension.   OPERATION:  Laparoscopic appendectomy, July 11, 2004.   HISTORY OF PRESENT ILLNESS:  Mr. [name] is a 35 year old black male who  presents with a 16 hour history of initially mid abdominal pain that has  become gradually worse and diffuse.  He describes constant pain. Somewhat  worse in the right lower quadrant. He has had some nausea and vomiting.  He  had a normal bowel movement this morning.  No urinary symptoms. The pain is  worse with any motion. Denies fever or chills.  Denies any chronic abdominal  pain or GI complaints.  He was hospitalized, however, at [facility] in April of 2005 with what was apparently a similar but much less  severe episode. CT scan was negative at that time and the pain resolved.   PAST MEDICAL HISTORY:  Only significant for hospitalization for abdominal  pain as above, otherwise, no previous medical or surgical illness. No  medications.  Allergies ASPIRIN as a child.   SOCIAL HISTORY:  Positive for cigarettes, occasional alcohol.   Family history, social history, review of systems otherwise unremarkable.   PHYSICAL EXAMINATION:  Temperature was 99.7, pulse 78, blood pressure  155/105, respirations 20.   Pertinent findings limited to the abdomen which revealed diffuse tenderness  and guarding greatest in the right lower quadrant.   LABORATORY DATA:  Urinalysis showed 30-50 red cells, this was following a  Foley catheter placement. White count was elevated at 25.6000, hemoglobin  16, electrolytes normal.   HOSPITAL COURSE:  The patient's clinical picture was felt consistent with  perforated viscus, possibly perforated appendicitis.  He was treated with IV  hydration, broad spectrum antibiotics and taken emergently to the operating  room.  Laparoscopy revealed perforated appendicitis with diffuse  peritonitis.  He underwent laparoscopic appendectomy and copious irrigation  of the abdominal cavity.  He continued to have some intermittent fever  postoperatively ranging from 100 to 101.  He was maintained on broad  spectrum antibiotics specifically Unasyn.  By the third postoperative day,  cultures were growing E. coli.  He was still febrile as high as 102. The  abdomen remained moderately diffusely tender.  O2 saturations were  intermittently low requiring oxygen. At this point, he was transferred to  the unit for observation for possible early sepsis. He continued to have  fever up to 103, however, his white count was decreased to 13.9000 by  October 11.  The abdomen remained distended, abdominal x-rays showed ileus.  Chest x-ray showed lower lobe infiltrates consistent with atelectasis or  early pneumonia.  He was continued on broad spectrum antibiotics.  He was  only on sips of liquids. His blood pressure remained high and [facility]  hospitalist's were consulted for management of his blood pressure.  He was  started on labetalol drip.  By October 11 the fourth postoperative day, he  appeared improved but less pain and tenderness, white count decreased to  12.3.  By the fifth postoperative day, he had a low grade fever, white count  was decreased to 11.  Abdomen was less tender and distended, he was stared  on a clear liquid diet.  He was switched to p.o. labetalol for control of  his blood pressure.  By October 14, the seventh postoperative day, he was  having some increase in lower abdominal  pain left greater than right. The  abdomen was more tender and white count was increased to 14.3.  CT scan of  the abdomen and pelvis was obtained which revealed multiple small pelvic  abscesses.  None of these were felt large enough for CT guided drainage. The  patient was switched to Primaxin for broader coverage.  He remained stable  with low grade temperatures and he was continued with nonoperative  treatment. CT also was consistent with partial small bowel obstruction  likely secondary to his pelvic abscesses.  Repeat CT scan was obtained on  October 18 which revealed significant decrease in the size of his abscesses.  At this point, he was feeling better and had had a bowel movement. Diet was  resumed and he was able to tolerate this well.  By October 19, his  tenderness had essentially resolved.  He felt well and his diet was advanced  to a regular diet. White count, however, was increased to 23,000.  This was  just observed due to overall clinical improvement and by October 20, his  white count was decreased to 15,000.  He had no pain or tenderness.  He was  tolerating a regular diet. The abdomen was benign.  At this point, he was  felt ready for discharge.  Antibiotic treatment will continue with p.o.  Augmentin for five days at home. He was also on now lisinopril 40 mg b.i.d.,  hydrochlorothiazide 25 mg daily for hypertension.  He was given a  prescription for Vicodin to use as needed for pain.  Followup is to be in my  office in one week and with primary care as well.      BTH/MEDQ  D:  08/19/2004  T:  08/20/2004  Job:  [id number]   cc:   [facility]

## 2011-02-20 NOTE — Op Note (Signed)
NAMEESTLE, HUGULEY                  ACCOUNT NO.:  [id number]   MEDICAL RECORD NO.:  [id number]          PATIENT TYPE:  INP   LOCATION:  0105                         FACILITY:  [facility]   PHYSICIAN:  Sharlet [name] T. Hoxworth, M.D.DATE OF BIRTH:  11/02/1974   DATE OF PROCEDURE:  07/11/2004  DATE OF DISCHARGE:                                 OPERATIVE REPORT   PREOPERATIVE DIAGNOSIS:  Acute appendicitis, possible perforation.   POSTOPERATIVE DIAGNOSIS:  Acute appendicitis with gangrene, perforation, and  diffuse peritonitis.   SURGICAL PROCEDURE:  Laparoscopic appendectomy.   SURGEON:  Lorne Skeens. Hoxworth, M.D.   ANESTHESIA:  General.   BRIEF HISTORY:  Mr. Cassar is a 35 year old black male with a 16-hour history  of mid abdominal and diffuse abdominal pain, more severe on the right lower  quadrant.  He has evidence of peritonitis on exam.  A laparoscopy,  appendectomy, and possible laparotomy have been recommended and accepted.  The nature of the procedure, indications, risk of bleeding, were discussed  and understood.  Patient is now brought to the operating room for this  procedure.   DESCRIPTION OF PROCEDURE:  Patient was brought to the operating room and  placed in a supine position on the operating room table.  General  endotracheal anesthesia was induced.  The Foley catheter was already in  placed.  He had received broad-spectrum antibiotics.  The abdomen was widely  sterilely prepped and draped.  Trocar sites were anesthetized with Marcaine.  A 1 cm incision was made in the midline just above the umbilicus and carried  down to the midline fascia, which was sharply incised to 1 cm.  The perineum  entered under direct vision.  Through a mattress suture of 0 Vicryl, the  Hasson trocar was placed and pneumoperitoneum established.  Under direct  vision, a 5 mm trocar was placed in the right subcostal area and a 12 mm  trocar in the left lower quadrant.  On entering the abdomen, there  was  evidence of diffuse peritonitis with areas of __________ exudate over the  small bowel and purulent fluid throughout the abdomen.  The inflammatory  reaction was somewhat more severe in the right lower quadrant.  Under direct  vision, a 5 mm trocar was placed in the right upper quadrant and a 12 mm  trocar in the left lower quadrant.  The appendix was exposed and was seen to  be acutely inflamed with an area of gangrene and perforation near the base.  The appendix was elevated, and the lateral peritoneal attachments were  divided, mobilizing the cecum and appendix.  The mesoappendix was then  sequentially divided with the harmonic scalpel.  Dissection was carried  down, freeing the appendix from mesentery and pericolic fat, so that the tip  of the cecum was well dissected, and there was slightly edematous but  otherwise healthy tissue below the perforation right at the base of the  appendix.  This was completely and thoroughly mobilized.  Following this,  the Endo GIA 45 mm blue-load stapler was used to fire below the base of the  appendix, across the tip of the cecum, being careful not to impinge on the  ileocecal valve, and the specimen was removed in an EndoCatch bag.  The  staple line was carefully inspected and was intact without bleeding.  The  abdomen was then carefully irrigated with 5 liters of normal saline, being  careful to break up any loculations of purulent fluid, and irrigation was  carried subdiaphragmatic, between loops of small bowel and in the pelvis  until the irrigation was essentially clear.  The operative site was again  inspected for hemostasis, which appeared complete.  The trocars were removed  under direct vision.  The mattress suture was  secured at the supraumbilical incision.  Skin incisions were closed with  interrupted Monocryl and Steri-Strips.  Sponge, needle, and instrument  counts were correct.  Dry sterile dressings were applied.  Patient was  taken  to the recovery room in good condition.      BTH/MEDQ  D:  07/11/2004  T:  07/11/2004  Job:  [id number]

## 2011-02-20 NOTE — H&P (Signed)
NAMEABDUL, BEIRNE                              ACCOUNT NO.:  [id number]   MEDICAL RECORD NO.:  [id number]                   PATIENT TYPE:  INP   LOCATION:  0108                                 FACILITY:  [facility]   PHYSICIAN:  Lorre Munroe., M.D.            DATE OF BIRTH:  September 27, 1976   DATE OF ADMISSION:  01/16/2004  DATE OF DISCHARGE:                                HISTORY & PHYSICAL   CHIEF COMPLAINT:  Abdominal pain.   HISTORY OF PRESENT ILLNESS:  This is a previously very healthy 35 year old  black male who for three days has had generalized abdominal pain.  It is  more constant than colicky.  He has had nausea but no vomiting.  He felt  constipated and took a laxative and that seemed to make him feel worse.  He  has not had diarrhea.  He has not had any fever or chills.  He has not had  back pain or chest pain.  No other person that he knows have similar  illness.  The patient was seen at the emergency department last night and  found to have generalized abdominal tenderness and a CT scan was obtained.  It shows a little bit of fluid-filled bowel loops in the right lower  quadrant but very minimal signs of any inflammation of the mesentery or  bowel wall.  The appendix cannot be visualized.  Dr. Chestine Spore felt that this  did not look much like appendicitis.  White count is 18,000.  The amylase is  normal and other labs are normal.  He has had no urinary symptoms and has an  unremarkable urinalysis.   PAST MEDICAL HISTORY:  He has never had an operation.  He takes no medicines  and has no known medicine allergies.  He occasionally smokes marijuana and  he occasionally drinks alcoholic beverages moderately.  He smokes a few  cigarettes each day.  His family history and childhood illnesses are  unremarkable.  His review of systems in detail is not remarkable.   PHYSICAL EXAMINATION:  GENERAL:  The patient is a bit overweight.  He is in  no acute distress although he complains of  pain.  VITAL SIGNS:  Vital signs and temperature normal as reported by nursing  staff.  MENTAL STATUS:  Normal.  HEAD, NECK, EYES, EARS, NOSE, MOUTH, AND THROAT:  Unremarkable.  CHEST:  Clear to auscultation.  HEART:  Rate and rhythm are normal. No murmur or gallop noted.  ABDOMEN:  Not distended, soft but diffusely tender in all quadrants about  equally. No definite rebound tenderness.  Bowel sounds are present but  hypoactive.  There is no mass.  GENITALIA:  Normal.  EXTREMITIES:  Normal, no edema.  Good pulses.  SKIN:  No lesions noted.  LYMPH NODES:  None enlarged.   IMPRESSION:  Abdominal pain of uncertain etiology.  I favor a self-limited  enteritis over appendicitis.  PLAN:  Admission for observation and supportive care and close follow up.                                               Lorre Munroe., M.D.   WB/MEDQ  D:  01/16/2004  T:  01/16/2004  Job:  [id number]

## 2011-04-24 ENCOUNTER — Ambulatory Visit (HOSPITAL_BASED_OUTPATIENT_CLINIC_OR_DEPARTMENT_OTHER): Payer: Self-pay | Attending: Family Medicine

## 2011-04-24 DIAGNOSIS — G4733 Obstructive sleep apnea (adult) (pediatric): Secondary | ICD-10-CM | POA: Insufficient documentation

## 2011-05-02 DIAGNOSIS — G4733 Obstructive sleep apnea (adult) (pediatric): Secondary | ICD-10-CM

## 2011-05-02 NOTE — Procedures (Signed)
Jeffrey [name], Jeffrey [name]                  ACCOUNT NO.:  [id number]  MEDICAL RECORD NO.:  [id number]          PATIENT TYPE:  OUT  LOCATION:  SLEEP CENTER                 FACILITY:  [facility]  PHYSICIAN:  Samira Acero D. Maple Hudson, MD, FCCP, FACPDATE OF BIRTH:  05-26[birth year]75  DATE OF STUDY:  04/24/2011                           NOCTURNAL POLYSOMNOGRAM  REFERRING PHYSICIAN:  Collene Leyden  REFERRING PHYSICIAN:  Dr. Boneta Lucks.  INDICATIONS FOR STUDY:  Hypersomnia with sleep apnea.  Epworth sleepiness score 7/24.  BMI 48.6.  Weight 310 pounds.  Height 67 inches.  Neck 19 inches.  HOME MEDICATIONS:  Charted and reviewed.  SLEEP ARCHITECTURE:  Split study protocol.  During the diagnostic phase, total sleep time 90 minutes with sleep efficiency 63.6%.  Stage I was 8.9%, stage II 91.1%, stage III and REM were absent.  Sleep latency 19.5 minutes.  Awake after sleep onset 32 minutes, arousal index 26.7. Bedtime medication:  None.  RESPIRATORY DATA:  Split study protocol.  Apnea-hypopnea index (AHI) 108.7 per hour.  A total of 163 events was scored including 79 obstructive apneas and 84 hypopneas.  Events were not positional.  CPAP was then titrated to 9 CWP, AHI 1.7 per hour.  She wore a large [employer] FX full-face mask with heated humidifier.  OXYGEN DATA:  Before CPAP, snoring was moderately loud with oxygen desaturation to a nadir of 77%.  With CPAP titration, mean oxygen saturation held 92.2% on room air and snoring was prevented.  CARDIAC DATA:  Normal sinus rhythm.  MOVEMENT/PARASOMNIA:  No significant movement disturbance.  Bathroom x1.  IMPRESSION/RECOMMENDATION: 1. Severe obstructive sleep apnea/hypopnea syndrome, AHI 108.7 per     hour with non positional events, moderate snoring, and oxygen     desaturation to a nadir of 77% on room air. 2. Successful CPAP titration to 9 CWP, AHI 1.7 per hour.  She wore a     large [employer] FX full-face mask with heated  humidifier.  Oxygenation was normalized and snoring prevented.     Brittish Bolinger D. Maple Hudson, MD, [facility], FACP Diplomate, Biomedical engineer of Sleep Medicine Electronically Signed    CDY/MEDQ  D:  05/02/2011 17:10:04  T:  05/02/2011 18:57:29  Job:  [id number]

## 2011-06-25 LAB — DIFFERENTIAL
Basophils Absolute: 0.1
Basophils Relative: 1
Eosinophils Absolute: 0.3
Eosinophils Relative: 4
Lymphocytes Relative: 39
Lymphs Abs: 3.8
Monocytes Absolute: 0.8
Monocytes Relative: 8
Neutro Abs: 4.7
Neutrophils Relative %: 49

## 2011-06-25 LAB — URINALYSIS, ROUTINE W REFLEX MICROSCOPIC
Bilirubin Urine: NEGATIVE
Glucose, UA: NEGATIVE
Ketones, ur: NEGATIVE
Leukocytes, UA: NEGATIVE
Nitrite: NEGATIVE
Protein, ur: 30 — AB
Specific Gravity, Urine: 1.021
Urobilinogen, UA: 0.2
pH: 6

## 2011-06-25 LAB — URINE MICROSCOPIC-ADD ON

## 2011-06-25 LAB — COMPREHENSIVE METABOLIC PANEL
ALT: 38
AST: 31
Albumin: 3.7
Alkaline Phosphatase: 65
BUN: 13
CO2: 23
Calcium: 9.1
Chloride: 108
Creatinine, Ser: 1.15
GFR calc Af Amer: >60
GFR calc non Af Amer: >60
Glucose, Bld: 148 — ABNORMAL HIGH
Potassium: 3.8
Sodium: 138
Total Bilirubin: 0.3
Total Protein: 7

## 2011-06-25 LAB — CBC
HCT: 42.3
Hemoglobin: 14.6
MCHC: 34.4
MCV: 85.8
Platelets: 259
RBC: 4.93
RDW: 14.8
WBC: 9.7

## 2011-07-08 ENCOUNTER — Emergency Department (HOSPITAL_COMMUNITY)
Admission: EM | Admit: 2011-07-08 | Discharge: 2011-07-09 | Disposition: A | Discharge status: Home / Self Care | Payer: Self-pay | Attending: Emergency Medicine | Admitting: Emergency Medicine

## 2011-07-08 DIAGNOSIS — K089 Disorder of teeth and supporting structures, unspecified: Secondary | ICD-10-CM | POA: Insufficient documentation

## 2011-07-08 DIAGNOSIS — I10 Essential (primary) hypertension: Secondary | ICD-10-CM | POA: Insufficient documentation

## 2011-07-08 DIAGNOSIS — E119 Type 2 diabetes mellitus without complications: Secondary | ICD-10-CM | POA: Insufficient documentation

## 2011-07-08 DIAGNOSIS — R220 Localized swelling, mass and lump, head: Secondary | ICD-10-CM | POA: Insufficient documentation

## 2011-07-08 DIAGNOSIS — K029 Dental caries, unspecified: Secondary | ICD-10-CM | POA: Insufficient documentation

## 2011-07-13 LAB — DIFFERENTIAL
Basophils Absolute: 0.1
Basophils Relative: 1
Eosinophils Absolute: 0.3
Eosinophils Relative: 4
Lymphocytes Relative: 42
Lymphs Abs: 4
Monocytes Absolute: 0.8
Monocytes Relative: 8
Neutro Abs: 4.4
Neutrophils Relative %: 46

## 2011-07-13 LAB — CBC
HCT: 42.1
Hemoglobin: 14.7
MCHC: 35
MCV: 83
Platelets: 247
RBC: 5.08
RDW: 16.5 — ABNORMAL HIGH
WBC: 9.7

## 2011-10-22 ENCOUNTER — Emergency Department (INDEPENDENT_AMBULATORY_CARE_PROVIDER_SITE_OTHER)
Admission: EM | Admit: 2011-10-22 | Discharge: 2011-10-22 | Disposition: A | Discharge status: Nursing Home (Includes ICF) | Payer: Self-pay | Source: Home / Self Care | Attending: Emergency Medicine | Admitting: Emergency Medicine

## 2011-10-22 ENCOUNTER — Emergency Department (HOSPITAL_COMMUNITY): Payer: Self-pay

## 2011-10-22 ENCOUNTER — Inpatient Hospital Stay (HOSPITAL_COMMUNITY)
Admission: EM | Admit: 2011-10-22 | Discharge: 2011-10-24 | DRG: 638 | Disposition: A | Discharge status: Home Health | Payer: MEDICAID | Attending: Internal Medicine | Admitting: Internal Medicine

## 2011-10-22 ENCOUNTER — Encounter (HOSPITAL_COMMUNITY): Payer: Self-pay | Admitting: Emergency Medicine

## 2011-10-22 ENCOUNTER — Other Ambulatory Visit: Payer: Self-pay

## 2011-10-22 DIAGNOSIS — Z794 Long term (current) use of insulin: Secondary | ICD-10-CM | POA: Diagnosis present

## 2011-10-22 DIAGNOSIS — I428 Other cardiomyopathies: Secondary | ICD-10-CM | POA: Diagnosis present

## 2011-10-22 DIAGNOSIS — IMO0001 Reserved for inherently not codable concepts without codable children: Principal | ICD-10-CM | POA: Diagnosis present

## 2011-10-22 DIAGNOSIS — R079 Chest pain, unspecified: Secondary | ICD-10-CM

## 2011-10-22 DIAGNOSIS — I5022 Chronic systolic (congestive) heart failure: Secondary | ICD-10-CM | POA: Diagnosis present

## 2011-10-22 DIAGNOSIS — E1122 Type 2 diabetes mellitus with diabetic chronic kidney disease: Secondary | ICD-10-CM | POA: Diagnosis present

## 2011-10-22 DIAGNOSIS — R739 Hyperglycemia, unspecified: Secondary | ICD-10-CM

## 2011-10-22 DIAGNOSIS — G4733 Obstructive sleep apnea (adult) (pediatric): Secondary | ICD-10-CM | POA: Diagnosis present

## 2011-10-22 DIAGNOSIS — I509 Heart failure, unspecified: Secondary | ICD-10-CM

## 2011-10-22 DIAGNOSIS — I10 Essential (primary) hypertension: Secondary | ICD-10-CM

## 2011-10-22 DIAGNOSIS — R7309 Other abnormal glucose: Secondary | ICD-10-CM

## 2011-10-22 DIAGNOSIS — E1159 Type 2 diabetes mellitus with other circulatory complications: Secondary | ICD-10-CM | POA: Diagnosis present

## 2011-10-22 DIAGNOSIS — Z79899 Other long term (current) drug therapy: Secondary | ICD-10-CM

## 2011-10-22 DIAGNOSIS — Z23 Encounter for immunization: Secondary | ICD-10-CM

## 2011-10-22 DIAGNOSIS — IMO0002 Reserved for concepts with insufficient information to code with codable children: Secondary | ICD-10-CM

## 2011-10-22 DIAGNOSIS — E1165 Type 2 diabetes mellitus with hyperglycemia: Secondary | ICD-10-CM

## 2011-10-22 DIAGNOSIS — R9431 Abnormal electrocardiogram [ECG] [EKG]: Secondary | ICD-10-CM | POA: Diagnosis present

## 2011-10-22 DIAGNOSIS — Z9119 Patient's noncompliance with other medical treatment and regimen: Secondary | ICD-10-CM

## 2011-10-22 DIAGNOSIS — Z91199 Patient's noncompliance with other medical treatment and regimen due to unspecified reason: Secondary | ICD-10-CM

## 2011-10-22 HISTORY — DX: Essential (primary) hypertension: I10

## 2011-10-22 LAB — DIFFERENTIAL
Basophils Absolute: 0 10*3/uL (ref 0.0–0.1)
Eosinophils Relative: 1 % (ref 0–5)
Lymphocytes Relative: 36 % (ref 12–46)
Neutro Abs: 5.5 10*3/uL (ref 1.7–7.7)

## 2011-10-22 LAB — COMPREHENSIVE METABOLIC PANEL
ALT: 38 U/L (ref 0–53)
AST: 34 U/L (ref 0–37)
CO2: 23 mEq/L (ref 19–32)
Calcium: 10.2 mg/dL (ref 8.4–10.5)
Chloride: 92 mEq/L — ABNORMAL LOW (ref 96–112)
GFR calc non Af Amer: >90 mL/min (ref >90)
Sodium: 131 mEq/L — ABNORMAL LOW (ref 135–145)

## 2011-10-22 LAB — GLUCOSE, CAPILLARY
Glucose-Capillary: 589 mg/dL (ref 70–99)
Glucose-Capillary: 553 mg/dL (ref 70–99)
Glucose-Capillary: 548 mg/dL — ABNORMAL HIGH (ref 70–99)
Glucose-Capillary: 349 mg/dL — ABNORMAL HIGH (ref 70–99)

## 2011-10-22 LAB — POCT URINALYSIS DIP (DEVICE)
Leukocytes, UA: NEGATIVE
Protein, ur: NEGATIVE mg/dL
Urobilinogen, UA: 0.2 mg/dL (ref 0.0–1.0)

## 2011-10-22 LAB — CBC
Platelets: 289 10*3/uL (ref 150–400)
RDW: 12.8 % (ref 11.5–15.5)
WBC: 9.9 10*3/uL (ref 4.0–10.5)

## 2011-10-22 LAB — CARDIAC PANEL(CRET KIN+CKTOT+MB+TROPI): Troponin I: <0.3 ng/mL (ref <0.30)

## 2011-10-22 MED ORDER — MORPHINE SULFATE 4 MG/ML IJ SOLN
4.0000 mg | Freq: Once | INTRAMUSCULAR | Status: AC
  Administered 2011-10-22: 4 mg via INTRAVENOUS
  Filled 2011-10-22: qty 1

## 2011-10-22 MED ORDER — METOPROLOL TARTRATE 50 MG PO TABS
50.0000 mg | ORAL_TABLET | Freq: Two times a day (BID) | ORAL | Status: DC
  Administered 2011-10-23 – 2011-10-24 (×4): 50 mg via ORAL
  Filled 2011-10-22 (×5): qty 1

## 2011-10-22 MED ORDER — ACETAMINOPHEN 325 MG PO TABS
650.0000 mg | ORAL_TABLET | Freq: Four times a day (QID) | ORAL | Status: DC | PRN
  Administered 2011-10-23 (×2): 650 mg via ORAL
  Filled 2011-10-22 (×2): qty 2

## 2011-10-22 MED ORDER — SODIUM CHLORIDE 0.9 % IJ SOLN
3.0000 mL | Freq: Two times a day (BID) | INTRAMUSCULAR | Status: DC
  Administered 2011-10-22 – 2011-10-24 (×4): 3 mL via INTRAVENOUS

## 2011-10-22 MED ORDER — ONDANSETRON HCL 4 MG/2ML IJ SOLN: 4.0000 mg | Freq: Four times a day (QID) | INTRAMUSCULAR | Status: DC | PRN

## 2011-10-22 MED ORDER — INSULIN GLARGINE 100 UNIT/ML ~~LOC~~ SOLN: 5.0000 [IU] | Freq: Every day | SUBCUTANEOUS | Status: DC

## 2011-10-22 MED ORDER — INSULIN ASPART 100 UNIT/ML ~~LOC~~ SOLN
10.0000 [IU] | Freq: Once | SUBCUTANEOUS | Status: AC
  Administered 2011-10-22: 10 [IU] via SUBCUTANEOUS
  Filled 2011-10-22: qty 1

## 2011-10-22 MED ORDER — GEMFIBROZIL 600 MG PO TABS
600.0000 mg | ORAL_TABLET | Freq: Two times a day (BID) | ORAL | Status: DC
  Administered 2011-10-23 – 2011-10-24 (×4): 600 mg via ORAL
  Filled 2011-10-22 (×5): qty 1

## 2011-10-22 MED ORDER — INSULIN ASPART 100 UNIT/ML ~~LOC~~ SOLN
10.0000 [IU] | Freq: Once | SUBCUTANEOUS | Status: AC
  Administered 2011-10-22: 10 [IU] via SUBCUTANEOUS
  Filled 2011-10-22: qty 10

## 2011-10-22 MED ORDER — INSULIN GLARGINE 100 UNIT/ML ~~LOC~~ SOLN
5.0000 [IU] | Freq: Every day | SUBCUTANEOUS | Status: DC
  Filled 2011-10-22: qty 3

## 2011-10-22 MED ORDER — SODIUM CHLORIDE 0.9 % IV BOLUS (SEPSIS)
1000.0000 mL | Freq: Once | INTRAVENOUS | Status: AC
  Administered 2011-10-22: 1000 mL via INTRAVENOUS

## 2011-10-22 MED ORDER — INSULIN ASPART 100 UNIT/ML ~~LOC~~ SOLN
0.0000 [IU] | Freq: Three times a day (TID) | SUBCUTANEOUS | Status: DC
  Filled 2011-10-22: qty 3

## 2011-10-22 MED ORDER — INSULIN GLARGINE 100 UNIT/ML ~~LOC~~ SOLN
5.0000 [IU] | Freq: Once | SUBCUTANEOUS | Status: AC
  Administered 2011-10-22: 5 [IU] via SUBCUTANEOUS
  Filled 2011-10-22: qty 1

## 2011-10-22 MED ORDER — NITROGLYCERIN 0.4 MG/HR TD PT24
0.4000 mg | MEDICATED_PATCH | Freq: Every day | TRANSDERMAL | Status: DC
  Administered 2011-10-22 – 2011-10-23 (×2): 0.4 mg via TRANSDERMAL
  Filled 2011-10-22 (×3): qty 1

## 2011-10-22 MED ORDER — ONDANSETRON HCL 4 MG PO TABS: 4.0000 mg | ORAL_TABLET | Freq: Four times a day (QID) | ORAL | Status: DC | PRN

## 2011-10-22 MED ORDER — ACETAMINOPHEN 650 MG RE SUPP: 650.0000 mg | Freq: Four times a day (QID) | RECTAL | Status: DC | PRN

## 2011-10-22 MED ORDER — INSULIN ASPART 100 UNIT/ML ~~LOC~~ SOLN
10.0000 [IU] | Freq: Once | SUBCUTANEOUS | Status: AC
  Administered 2011-10-23: 10 [IU] via SUBCUTANEOUS

## 2011-10-22 MED ORDER — LISINOPRIL 20 MG PO TABS
20.0000 mg | ORAL_TABLET | Freq: Every day | ORAL | Status: DC
  Administered 2011-10-23 – 2011-10-24 (×2): 20 mg via ORAL
  Filled 2011-10-22 (×2): qty 1

## 2011-10-22 NOTE — ED Notes (Signed)
Monitor with alarms

## 2011-10-22 NOTE — ED Notes (Signed)
Chest pressure, tightness, palpitations.  Patient points to the left chest as location of discomfort.  Onset 5 days ago, reports as constant, dizziness, headaches and nausea

## 2011-10-22 NOTE — ED Provider Notes (Cosign Needed Addendum)
History     CSN: [id number]  Arrival date & time 10/22/11  1040   First MD Initiated Contact with Patient 10/22/11 1121      Chief Complaint  Patient presents with  . Chest Pain  . Weakness    (Consider location/radiation/quality/duration/timing/severity/associated sxs/prior treatment) HPI Comments: Pt with DM, CHF with EF 20-25% with 4 days polyuria, increased thirst, nausea, generalized weakness, dizziness.  Also with constant left sided chest pressure and episodes of palpitations lasting up to 10 minutes. States feels "sweaty" when having palpitations. Occasional tingling in left arm which is new.  No exertional, positional component to CP. CP unchanged since it started 4 days ago.  Reports nocturia, but no orthopnea, abd pain, significant weight gain, signficant LE edema, cough, wheeze, SOB. No change in lasix. Does not check glucose regularly  But states last time he checked it 3 days ago it was in 200's. No change in metformin. Pt denies recent illnesses.  ROS as noted in HPI. All other ROS negative.   Patient is a 36 y.o. male presenting with chest pain and weakness. The history is provided by the patient.  Chest Pain  Associated symptoms include weakness.    Weakness  Additional symptoms include weakness.    Past Medical History  Diagnosis Date  . Diabetes mellitus   . CHF (congestive heart failure)     EF 20-25%  . Hypertension   . Obesity     Past Surgical History  Procedure Date  . Appendectomy     History reviewed. No pertinent family history.  History  Substance Use Topics  . Smoking status: Former Games developer  . Smokeless tobacco: Not on file  . Alcohol Use: No     quit etoh 2011      Review of Systems  Cardiovascular: Positive for chest pain.  Neurological: Positive for weakness.    Allergies  Aspirin  Home Medications   Current Outpatient Rx  Name Route Sig Dispense Refill  . FUROSEMIDE 40 MG PO TABS Oral Take 40 mg by mouth daily.      [name] GEMFIBROZIL 600 MG PO TABS Oral Take 600 mg by mouth 2 (two) times daily before a meal.    . LISINOPRIL 20 MG PO TABS Oral Take 20 mg by mouth daily.    [name] METFORMIN HCL ER (OSM) 500 MG PO TB24 Oral Take 500 mg by mouth daily with breakfast.    . METOPROLOL TARTRATE 50 MG PO TABS Oral Take 50 mg by mouth 2 (two) times daily.      BP 116/81  Pulse 87  Temp(Src) 98.5 F (36.9 C) (Oral)  Resp 22  SpO2 96%  Physical Exam  Nursing note and vitals reviewed. Constitutional: He is oriented to person, place, and time. He appears well-developed and well-nourished. No distress.  HENT:  Head: Normocephalic and atraumatic.  Eyes: Conjunctivae and EOM are normal.  Neck: Normal range of motion. Neck supple. No JVD present.       Able to lie flat comfortably   Cardiovascular: Normal rate, regular rhythm, normal heart sounds and intact distal pulses.  Exam reveals no gallop and no friction rub.   No murmur heard. Pulmonary/Chest: Effort normal and breath sounds normal. No respiratory distress. He has no wheezes. He exhibits no tenderness.  Abdominal: Soft. Bowel sounds are normal. He exhibits no distension. There is no hepatomegaly. There is no tenderness. There is no rebound and no guarding.  Musculoskeletal: Normal range of motion. He exhibits edema.  1+ edema LE bilaterally  Neurological: He is alert and oriented to person, place, and time.  Skin: Skin is warm and dry.  Psychiatric: He has a normal mood and affect. His behavior is normal. Judgment and thought content normal.    ED Course  Procedures (including critical care time)  Labs Reviewed  GLUCOSE, CAPILLARY - Abnormal; Notable for the following:    Glucose-Capillary 589 (*)    All other components within normal limits  POCT URINALYSIS DIP (DEVICE) - Abnormal; Notable for the following:    Glucose, UA 500 (*)    Hgb urine dipstick TRACE (*)    All other components within normal limits  POCT URINALYSIS DIPSTICK  POCT CBG  MONITORING   No results found.   1. Hyperglycemia   2. Chest pain     Results for orders placed during the hospital encounter of 10/22/11  GLUCOSE, CAPILLARY      Component Value Range   Glucose-Capillary 589 (*) 70 - 99 (mg/dL)   Comment 1 Documented in Chart    POCT URINALYSIS DIP (DEVICE)      Component Value Range   Glucose, UA 500 (*) NEGATIVE (mg/dL)   Bilirubin Urine NEGATIVE  NEGATIVE    Ketones, ur NEGATIVE  NEGATIVE (mg/dL)   Specific Gravity, Urine <=1.005  1.005 - 1.030    Hgb urine dipstick TRACE (*) NEGATIVE    pH 5.5  5.0 - 8.0    Protein, ur NEGATIVE  NEGATIVE (mg/dL)   Urobilinogen, UA 0.2  0.0 - 1.0 (mg/dL)   Nitrite NEGATIVE  NEGATIVE    Leukocytes, UA NEGATIVE  NEGATIVE      MDM  Concern for DKA. Also with EKG changes. VS acceptable. Pt with CP here but states is unchanged from past 4 days. Transferring to ED.   Previous chart, labs, imaging reviewed. Echo in 10/2010. As noted in hpi   Date: 10/22/2011  Rate: 83  Rhythm: normal sinus rhythm  QRS Axis: left  Intervals: normal  ST/T Wave abnormalities: TWI in inferior leads II, aVF and laterally V5, V6. atrial hypertrophy  Conduction Disutrbances:none  Narrative Interpretation:   Old EKG Reviewed: changes noted twi new from prev ekg in 10/2010     Luiz Blare, MD 10/22/11 1158  Luiz Blare, MD 10/22/11 1159

## 2011-10-22 NOTE — Consult Note (Signed)
Admit date: 10/22/2011 Referring Physician  [facility] emergency room Primary Physician  Dr. Philipp Deputy, [facility] Primary Cardiologist  Wayne Both, III, M.D. Reason for Consultation  EKG changes  ASSESSMENT: 1. Nonischemic cardiomyopathy with known LVEF of 20-25%, last documented 2012.  2. Abnormal EKG with biatrial abnormality, left axis deviation, and inferior T-wave abnormality. The T-wave abnormality is nonspecific and new since January 2012  3. Diabetes mellitus, with severe hyperglycemia and blood sugars greater than 550.  4. Documented sleep apnea, untreated  5. Obesity, morbid  6. The patient has had a five-day history of near continuous chest pressure that does not change with activity, position, and is not associated with shortness of breath. Etiology is uncertain. No evidence of infarction by enzymes to this point.  7. Hypertension  PLAN: 1. The patient's diabetes needs prompt attention. I believe accounts for much of the symptoms he came to the emergency room for which include headache, thirst, polyuria, weakness,etc. I would recommend admission by the hospitalist service. 2.  I would cycle cardiac enzymes and will also check a BNP level. 3. I stressed the importance of treating his underlying sleep apnea. The sleep apnea stressing his cardiovascular system and aggravating his condition. 4. I've seen the patient once in my office. He has not followed up since that time. I do believe he takes his medications and is as compliant as he can be. I will follow along and be happy to help in any way possible to   HPI: The patient gives a several day to perhaps 1 week history of dizziness, thirst, polyuria, palpitations, and headache. Over the past 3-5 days he has experienced a continuous pressure-like sensation in the left chest. This has felt similar to heart failure symptoms that he had last year. The difference now being that he has no shortness of breath associated with it.  Activity does not change it. He has not been able to sleep well. He has been getting up to urinate every 30-45 minutes at home. The last time I saw him I recommended that he get a sleep study. According to him it was done but he has never been started on therapy. There are no exertional complaints. The chest discomfort is not worsened with physical activity. There is no associated dyspnea. There is no radiation.. There no specific other complaints.   PMH:   Past Medical History  Diagnosis Date  . Diabetes mellitus   . CHF (congestive heart failure)     EF 20-25%  . Hypertension   . Obesity      PSH:   Past Surgical History  Procedure Date  . Appendectomy     Allergies:  Aspirin Prior to Admit Meds:   (Not in a hospital admission) Fam HX:   No family history on file. Social HX:    History   Social History  . Marital Status: Single    Spouse Name: N/A    Number of Children: N/A  . Years of Education: N/A   Occupational History  . Not on file.   Social History Main Topics  . Smoking status: Former Games developer  . Smokeless tobacco: Not on file  . Alcohol Use: No     quit etoh 2011  . Drug Use: No     h/o marijuana quit 2011  . Sexually Active:    Other Topics Concern  . Not on file   Social History Narrative  . No narrative on file     Review of  Systems: Patient denies neurological symptoms. He has not had syncope. Is no peripheral edema. He has not had blood in his urine or stool. No nausea of vomiting.  Physical Exam: Blood pressure 129/92, pulse 76, temperature 99.4 F (37.4 C), temperature source Oral, resp. rate 16, SpO2 100.00%. Weight change:   Patient is markedly obese. He is lying comfortably without complaints. There is no dyspnea.  HEENT exam unremarkable.  Chest is clear.  An S4 gallop is audible. No murmurs heard. Abdomen soft. Extremities reveal no edema. Pulses are 2+ and symmetric in upper and lower extremities. Neurological exam is  unremarkable.  Labs:   Lab Results  Component Value Date   WBC 9.9 10/22/2011   HGB 16.6 10/22/2011   HCT 46.5 10/22/2011   MCV 81.0 10/22/2011   PLT 289 10/22/2011    Lab 10/22/11 1313  NA 131*  K 4.7  CL 92*  CO2 23  BUN 25*  CREATININE 0.97  CALCIUM 10.2  PROT 8.9*  BILITOT 0.4  ALKPHOS 91  ALT 38  AST 34  GLUCOSE 585*    Lab Results  Component Value Date   CKTOTAL 205 10/22/2011   CKMB 1.8 10/22/2011   TROPONINI <0.30 10/22/2011     Lab Results  Component Value Date   CHOL  Value: 167        ATP III CLASSIFICATION:  <200     mg/dL   Desirable  454-098  mg/dL   Borderline High  >=119    mg/dL   High        1/47/8295   CHOL 167 10/16/2010   Lab Results  Component Value Date   HDL 27* 10/16/2010   HDL 27 10/16/2010   Lab Results  Component Value Date   LDLCALC  Value: 106        Total Cholesterol/HDL:CHD Risk Coronary Heart Disease Risk Table                     Men   Women  1/2 Average Risk   3.4   3.3  Average Risk       5.0   4.4  2 X Average Risk   9.6   7.1  3 X Average Risk  23.4   11.0        Use the calculated Patient Ratio above and the CHD Risk Table to determine the patient's CHD Risk.        ATP III CLASSIFICATION (LDL):  <100     mg/dL   Optimal  621-308  mg/dL   Near or Above                    Optimal  130-159  mg/dL   Borderline  657-846  mg/dL   High  >962     mg/dL   Very High* 9/52/8413   LDLCALC 106 10/16/2010   Lab Results  Component Value Date   TRIG 172* 10/16/2010   TRIG 172 10/16/2010   Lab Results  Component Value Date   CHOLHDL 6.2 10/16/2010   CHOLHDL 6.2 10/16/2010   No results found for this basename: LDLDIRECT      Radiology:  Dg Chest Port 1 View  10/22/2011  *RADIOLOGY REPORT*  Clinical Data: Chest pain, tightness, and dizziness  PORTABLE CHEST - 1 VIEW  Comparison: October 16, 2010 the  Findings: Mild cardiomegaly is unchanged.  The mediastinum and pulmonary vasculature are within normal limits.  Both lungs are clear.  IMPRESSION:  Mild cardiomegaly, stable.  Original Report Authenticated By: Brandon Melnick, M.D.   EKG:  ECG reveals sinus rhythm, biatrial abnormality, left axis deviation and possible Q waves in 3 and aVF. Nonspecific T wave flattening/inversions noted in 2 and aVF. No acute ST-T wave changes are noted.    Lesleigh Noe 10/22/2011 7:17 PM

## 2011-10-22 NOTE — ED Notes (Signed)
CBG is 553. Rn notified.

## 2011-10-22 NOTE — ED Notes (Signed)
[facility] notified, ed notified

## 2011-10-22 NOTE — ED Provider Notes (Signed)
History     CSN: [id number]  Arrival date & time 10/22/11  1232   First MD Initiated Contact with Patient 10/22/11 1234      Chief Complaint  Patient presents with  . Chest Pain    (Consider location/radiation/quality/duration/timing/severity/associated sxs/prior treatment) HPI  Past Medical History  Diagnosis Date  . Diabetes mellitus   . CHF (congestive heart failure)     EF 20-25%  . Hypertension   . Obesity   . Obstructive sleep apnea     Past Surgical History  Procedure Date  . Appendectomy     No family history on file.  History  Substance Use Topics  . Smoking status: Former Games developer  . Smokeless tobacco: Not on file  . Alcohol Use: No     quit etoh 2011      Review of Systems  Allergies  Aspirin  Home Medications   Current Outpatient Rx  Name Route Sig Dispense Refill  . FUROSEMIDE 40 MG PO TABS Oral Take 40 mg by mouth 2 (two) times daily.     [name] GEMFIBROZIL 600 MG PO TABS Oral Take 600 mg by mouth 2 (two) times daily before a meal.    . LISINOPRIL 20 MG PO TABS Oral Take 20 mg by mouth daily.    [name] METFORMIN HCL ER (OSM) 500 MG PO TB24 Oral Take 500 mg by mouth 2 (two) times daily.     [name] METOPROLOL TARTRATE 50 MG PO TABS Oral Take 50 mg by mouth 2 (two) times daily.      BP 129/92  Pulse 76  Temp(Src) 99.4 F (37.4 C) (Oral)  Resp 16  SpO2 100%  Physical Exam  ED Course  Procedures (including critical care time)  Labs Reviewed  COMPREHENSIVE METABOLIC PANEL - Abnormal; Notable for the following:    Sodium 131 (*)    Chloride 92 (*)    Glucose, Bld 585 (*)    BUN 25 (*)    Total Protein 8.9 (*)    All other components within normal limits  GLUCOSE, CAPILLARY - Abnormal; Notable for the following:    Glucose-Capillary 553 (*)    All other components within normal limits  GLUCOSE, CAPILLARY - Abnormal; Notable for the following:    Glucose-Capillary 548 (*)    All other components within normal limits  GLUCOSE, CAPILLARY -  Abnormal; Notable for the following:    Glucose-Capillary 500 (*)    All other components within normal limits  GLUCOSE, CAPILLARY - Abnormal; Notable for the following:    Glucose-Capillary 349 (*)    All other components within normal limits  CBC  DIFFERENTIAL  CARDIAC PANEL(CRET KIN+CKTOT+MB+TROPI)   Dg Chest Port 1 View  10/22/2011  *RADIOLOGY REPORT*  Clinical Data: Chest pain, tightness, and dizziness  PORTABLE CHEST - 1 VIEW  Comparison: October 16, 2010 the  Findings: Mild cardiomegaly is unchanged.  The mediastinum and pulmonary vasculature are within normal limits.  Both lungs are clear.  IMPRESSION: Mild cardiomegaly, stable.  Original Report Authenticated By: Brandon Melnick, M.D.     1. Chest pain   2. Hyperglycemia       MDM   Discussed with Cardiology, Dr Katrinka Blazing. Doesn't believe symptoms due primarily to cardiac issue. Will follow along if admitted by medicine and get echo as inpt. Discussed with [facility] and will admit pt.         Loren Racer, MD 10/22/11 2008

## 2011-10-22 NOTE — ED Notes (Addendum)
Here with c/o chest pain x 5 days.sent here from urgent care. Pt states nausea and dizziness with diaphoresis this am. Describes pain as a tightness. Pt has history of chf and diabetes ,bs 568 pta.

## 2011-10-22 NOTE — ED Notes (Signed)
3310-01 Ready 

## 2011-10-22 NOTE — ED Notes (Signed)
3739-01 Ready 

## 2011-10-22 NOTE — ED Notes (Signed)
One attempt in right hand by jan bowers, rn, site unsuccessful when fluids infused, jan removed catheter and applied dressing

## 2011-10-22 NOTE — ED Notes (Signed)
Dr Tresa Endo vollmer is pcp, last seen 09/2011.

## 2011-10-22 NOTE — ED Provider Notes (Signed)
History     CSN: [id number]  Arrival date & time 10/22/11  1232   First MD Initiated Contact with Patient 10/22/11 1234      Chief Complaint  Patient presents with  . Chest Pain    (Consider location/radiation/quality/duration/timing/severity/associated sxs/prior treatment) HPI Comments: Sent from urgent care for evaluation of chest pain for the past five days.  Has history of chf with reduced ef.  He denies to me having ever had a cath.    Patient is a 36 y.o. male presenting with chest pain. The history is provided by the patient.  Chest Pain The chest pain began 3 - 5 days ago. Chest pain occurs constantly. The chest pain is worsening. The pain is associated with breathing and coughing. The severity of the pain is moderate. The quality of the pain is described as tightness. The pain does not radiate. Pertinent negatives for primary symptoms include no fever, no fatigue, no shortness of breath, no cough, no wheezing, no nausea and no vomiting.  Pertinent negatives for associated symptoms include no diaphoresis. He tried nothing for the symptoms. Risk factors include sedentary lifestyle, male gender and obesity.  His family medical history is significant for CAD in family.  Procedure history is positive for echocardiogram.  Procedure history is negative for cardiac catheterization.     Past Medical History  Diagnosis Date  . Diabetes mellitus   . CHF (congestive heart failure)     EF 20-25%  . Hypertension   . Obesity     Past Surgical History  Procedure Date  . Appendectomy     No family history on file.  History  Substance Use Topics  . Smoking status: Former Games developer  . Smokeless tobacco: Not on file  . Alcohol Use: No     quit etoh 2011      Review of Systems  Constitutional: Negative for fever, diaphoresis and fatigue.  Respiratory: Negative for cough, shortness of breath and wheezing.   Cardiovascular: Positive for chest pain.  Gastrointestinal: Negative  for nausea and vomiting.  All other systems reviewed and are negative.    Allergies  Aspirin  Home Medications   Current Outpatient Rx  Name Route Sig Dispense Refill  . FUROSEMIDE 40 MG PO TABS Oral Take 40 mg by mouth daily.    [name] GEMFIBROZIL 600 MG PO TABS Oral Take 600 mg by mouth 2 (two) times daily before a meal.    . LISINOPRIL 20 MG PO TABS Oral Take 20 mg by mouth daily.    [name] METFORMIN HCL ER (OSM) 500 MG PO TB24 Oral Take 500 mg by mouth daily with breakfast.    . METOPROLOL TARTRATE 50 MG PO TABS Oral Take 50 mg by mouth 2 (two) times daily.      BP 122/77  Pulse 82  Temp(Src) 99.4 F (37.4 C) (Oral)  Resp 18  SpO2 2%  Physical Exam  Nursing note and vitals reviewed. Constitutional: He is oriented to person, place, and time. He appears well-developed and well-nourished. No distress.  HENT:  Head: Normocephalic and atraumatic.  Neck: Normal range of motion. Neck supple.  Cardiovascular: Normal rate and regular rhythm.   No murmur heard. Pulmonary/Chest: Effort normal and breath sounds normal. No respiratory distress. He has no wheezes.  Abdominal: Soft. Bowel sounds are normal. He exhibits no distension.  Musculoskeletal: Normal range of motion. He exhibits no edema.  Neurological: He is alert and oriented to person, place, and time.  Skin: Skin is warm  and dry. He is not diaphoretic.    ED Course  Procedures (including critical care time)   Labs Reviewed  CBC  DIFFERENTIAL  COMPREHENSIVE METABOLIC PANEL  CARDIAC PANEL(CRET KIN+CKTOT+MB+TROPI)   No results found.   No diagnosis found.   Date: 10/22/2011  Rate: 78  Rhythm: normal sinus rhythm  QRS Axis: left  Intervals: normal  ST/T Wave abnormalities: nonspecific ST/T changes  Conduction Disutrbances:none  Narrative Interpretation:   Old EKG Reviewed: changes noted    MDM  As the patient has a concerning history and risk factors as well as an equivocal ekg, I would like for Cardiology to  evaluate him.  I spoke with Dr. Katrinka Blazing from [facility] who agrees to see him in the ED.  He will determine the appropriate disposition.        Geoffery Lyons, MD 10/22/11 [phone]

## 2011-10-22 NOTE — H&P (Signed)
Jeffrey [name] is an 36 y.o. male.   PCP - Dr.Volmer. Chief Complaint: Weakness,chest tightness. HPI: 89-year-old male with known history of nonischemic cardiomyopathy last EF measured in January of 2012 were showing an EF of 25% present in the ER because of ongoing weakness fatigue and chest tightness over the last 5 days. Chest x-ray EKG and cardiac enzymes may not showing anything acute. Cardiology consult was obtained with Dr. Katrinka Blazing who was on call. Patient's blood sugar was running high more than 600 but not in DKA. Dr. Katrinka Blazing feels his symptoms are noncardiac and could be from his hyperglycemia. At this time cardiologist as recommended hospitalist admission with them following as consult. Patient has been racing he start on gemfibrozil 3 weeks ago. Patient does not have any muscular pain denies any shortness of breath but does get chest tightness on exertion. Denies any cough phlegm fever chills nausea vomiting abdominal pain dysuria discharges or diarrhea. Patient states he quit smoking cigarettes drinking alcohol and abusing cocaine a year ago.  Past Medical History  Diagnosis Date  . Diabetes mellitus   . CHF (congestive heart failure)     EF 20-25%  . Hypertension   . Obesity   . Obstructive sleep apnea     Past Surgical History  Procedure Date  . Appendectomy     No family history on file. Social History:  reports that he has quit smoking. He does not have any smokeless tobacco history on file. He reports that he does not drink alcohol or use illicit drugs.  Allergies:  Allergies  Allergen Reactions  . Aspirin Rash    Rash, itching    Medications Prior to Admission  Medication Dose Route Frequency Provider Last Rate Last Dose  . insulin aspart (novoLOG) injection 10 Units  10 Units Subcutaneous Once Geoffery Lyons, MD   10 Units at 10/22/11 1528  . insulin aspart (novoLOG) injection 10 Units  10 Units Subcutaneous Once Loren Racer, MD   10 Units at 10/22/11 1843  . insulin  glargine (LANTUS) injection 5 Units  5 Units Subcutaneous Once Loren Racer, MD      . morphine 4 MG/ML injection 4 mg  4 mg Intravenous Once Loren Racer, MD   4 mg at 10/22/11 1657  . morphine 4 MG/ML injection 4 mg  4 mg Intravenous Once Loren Racer, MD      . nitroGLYCERIN (NITRODUR - Dosed in mg/24 hr) patch 0.4 mg  0.4 mg Transdermal Daily Eduard Clos, MD      . sodium chloride 0.9 % bolus 1,000 mL  1,000 mL Intravenous Once Loren Racer, MD   1,000 mL at 10/22/11 1657   No current outpatient prescriptions on file as of 10/22/2011.    Results for orders placed during the hospital encounter of 10/22/11 (from the past 48 hour(s))  GLUCOSE, CAPILLARY     Status: Abnormal   Collection Time   10/22/11 12:54 PM      Component Value Range Comment   Glucose-Capillary 553 (*) 70 - 99 (mg/dL)    Comment 1 Notify RN     CBC     Status: Normal   Collection Time   10/22/11  1:13 PM      Component Value Range Comment   WBC 9.9  4.0 - 10.5 (K/uL)    RBC 5.74  4.22 - 5.81 (MIL/uL)    Hemoglobin 16.6  13.0 - 17.0 (g/dL)    HCT 62.1  30.8 - 65.7 (%)  MCV 81.0  78.0 - 100.0 (fL)    MCH 28.9  26.0 - 34.0 (pg)    MCHC 35.7  30.0 - 36.0 (g/dL) CORRECTED FOR COLD AGGLUTININS   RDW 12.8  11.5 - 15.5 (%)    Platelets 289  150 - 400 (K/uL)   DIFFERENTIAL     Status: Normal   Collection Time   10/22/11  1:13 PM      Component Value Range Comment   Neutrophils Relative 56  43 - 77 (%)    Neutro Abs 5.5  1.7 - 7.7 (K/uL)    Lymphocytes Relative 36  12 - 46 (%)    Lymphs Abs 3.6  0.7 - 4.0 (K/uL)    Monocytes Relative 7  3 - 12 (%)    Monocytes Absolute 0.7  0.1 - 1.0 (K/uL)    Eosinophils Relative 1  0 - 5 (%)    Eosinophils Absolute 0.1  0.0 - 0.7 (K/uL)    Basophils Relative 0  0 - 1 (%)    Basophils Absolute 0.0  0.0 - 0.1 (K/uL)   COMPREHENSIVE METABOLIC PANEL     Status: Abnormal   Collection Time   10/22/11  1:13 PM      Component Value Range Comment   Sodium 131 (*)  135 - 145 (mEq/L)    Potassium 4.7  3.5 - 5.1 (mEq/L)    Chloride 92 (*) 96 - 112 (mEq/L)    CO2 23  19 - 32 (mEq/L)    Glucose, Bld 585 (*) 70 - 99 (mg/dL)    BUN 25 (*) 6 - 23 (mg/dL)    Creatinine, Ser 1.61  0.50 - 1.35 (mg/dL)    Calcium 09.6  8.4 - 10.5 (mg/dL)    Total Protein 8.9 (*) 6.0 - 8.3 (g/dL)    Albumin 4.4  3.5 - 5.2 (g/dL)    AST 34  0 - 37 (U/L)    ALT 38  0 - 53 (U/L)    Alkaline Phosphatase 91  39 - 117 (U/L)    Total Bilirubin 0.4  0.3 - 1.2 (mg/dL)    GFR calc non Af Amer >90  >90 (mL/min)    GFR calc Af Amer >90  >90 (mL/min)   CARDIAC PANEL(CRET KIN+CKTOT+MB+TROPI)     Status: Normal   Collection Time   10/22/11  1:14 PM      Component Value Range Comment   Total CK 205  7 - 232 (U/L)    CK, MB 1.8  0.3 - 4.0 (ng/mL)    Troponin I <0.30  <0.30 (ng/mL)    Relative Index 0.9  0.0 - 2.5    GLUCOSE, CAPILLARY     Status: Abnormal   Collection Time   10/22/11  4:34 PM      Component Value Range Comment   Glucose-Capillary 548 (*) 70 - 99 (mg/dL)    Comment 1 Notify RN     GLUCOSE, CAPILLARY     Status: Abnormal   Collection Time   10/22/11  5:47 PM      Component Value Range Comment   Glucose-Capillary 500 (*) 70 - 99 (mg/dL)   GLUCOSE, CAPILLARY     Status: Abnormal   Collection Time   10/22/11  7:44 PM      Component Value Range Comment   Glucose-Capillary 349 (*) 70 - 99 (mg/dL)    Comment 1 Notify RN      Dg Chest Port 1 View  10/22/2011  *RADIOLOGY  REPORT*  Clinical Data: Chest pain, tightness, and dizziness  PORTABLE CHEST - 1 VIEW  Comparison: October 16, 2010 the  Findings: Mild cardiomegaly is unchanged.  The mediastinum and pulmonary vasculature are within normal limits.  Both lungs are clear.  IMPRESSION: Mild cardiomegaly, stable.  Original Report Authenticated By: Brandon Melnick, M.D.    Review of Systems  Constitutional: Positive for malaise/fatigue.  HENT: Negative.   Eyes: Negative.   Respiratory: Negative.   Cardiovascular:        Chest tightness.  Gastrointestinal: Negative.   Genitourinary: Negative.   Musculoskeletal: Negative.   Skin: Negative.   Neurological: Positive for weakness.  Endo/Heme/Allergies: Negative.   Psychiatric/Behavioral: Negative.     Blood pressure 129/92, pulse 76, temperature 99.4 F (37.4 C), temperature source Oral, resp. rate 16, SpO2 100.00%. Physical Exam  Constitutional: He is oriented to person, place, and time. He appears well-developed. No distress.       Obese.  HENT:  Head: Normocephalic and atraumatic.  Right Ear: External ear normal.  Left Ear: External ear normal.  Nose: Nose normal.  Mouth/Throat: Oropharynx is clear and moist. No oropharyngeal exudate.  Eyes: Conjunctivae and EOM are normal. Pupils are equal, round, and reactive to light. Right eye exhibits no discharge. Left eye exhibits no discharge. No scleral icterus.  Neck: Normal range of motion. Neck supple.  Cardiovascular: Normal rate, regular rhythm and normal heart sounds.   Respiratory: Effort normal and breath sounds normal. No respiratory distress. He has no wheezes. He has no rales.  GI: Soft. Bowel sounds are normal. He exhibits no distension. There is no tenderness. There is no rebound.  Musculoskeletal: Normal range of motion. He exhibits no edema and no tenderness.  Neurological: He is alert and oriented to person, place, and time. He has normal reflexes.       Moves upper and lower extremity.  Skin: Skin is warm and dry. No rash noted. He is not diaphoretic. No erythema.  Psychiatric: His behavior is normal.     Assessment/Plan #1. Uncontrolled diabetes mellitus type 2 not in DKA - patient has this time been given almost 30 units of NovoLog by the ER physician. I have started him on Lantus 5 units for now with sliding scale coverage. Patient will need nutrition consult. We'll check hemoglobin A1c. #2. History of nonischemic cardiomyopathy - cardiologist has already seen the patient. Presently his  Lasix is on hold patient has received 2 L of normal saline in the ER because of his hyperglycemia and weakness. Once his blood sugar gets well controlled we will start his Lasix back. For now we will continue his lisinopril and Toprol. 2-D echo has been ordered. #3. History of hyperlipidemia - continue gemfibrozil. #4. History of OSA noncompliant with his CPAP. #5. History of polysubstance abuse including cigarette smoking, alcohol and cocaine which he states he quit a year ago.  CODE STATUS - full code.  Eduard Clos 10/22/2011, 9:21 PM

## 2011-10-23 ENCOUNTER — Inpatient Hospital Stay (HOSPITAL_COMMUNITY): Payer: Self-pay

## 2011-10-23 DIAGNOSIS — G4733 Obstructive sleep apnea (adult) (pediatric): Secondary | ICD-10-CM | POA: Diagnosis present

## 2011-10-23 LAB — CBC
MCH: 29.4 pg (ref 26.0–34.0)
MCHC: 36 g/dL (ref 30.0–36.0)
MCV: 81.6 fL (ref 78.0–100.0)
Platelets: 287 10*3/uL (ref 150–400)
RDW: 12.8 % (ref 11.5–15.5)
WBC: 10.7 10*3/uL — ABNORMAL HIGH (ref 4.0–10.5)

## 2011-10-23 LAB — COMPREHENSIVE METABOLIC PANEL
ALT: 32 U/L (ref 0–53)
AST: 32 U/L (ref 0–37)
Albumin: 3.6 g/dL (ref 3.5–5.2)
Alkaline Phosphatase: 66 U/L (ref 39–117)
BUN: 17 mg/dL (ref 6–23)
Chloride: 98 mEq/L (ref 96–112)
Potassium: 3.7 mEq/L (ref 3.5–5.1)
Sodium: 133 mEq/L — ABNORMAL LOW (ref 135–145)
Total Bilirubin: 0.3 mg/dL (ref 0.3–1.2)

## 2011-10-23 LAB — RAPID URINE DRUG SCREEN, HOSP PERFORMED
Barbiturates: NOT DETECTED
Tetrahydrocannabinol: NOT DETECTED

## 2011-10-23 LAB — CARDIAC PANEL(CRET KIN+CKTOT+MB+TROPI)
Relative Index: 1 (ref 0.0–2.5)
Relative Index: 1 (ref 0.0–2.5)
Relative Index: 1 (ref 0.0–2.5)
Total CK: 180 U/L (ref 7–232)
Total CK: 172 U/L (ref 7–232)
Troponin I: <0.3 ng/mL (ref <0.30)

## 2011-10-23 LAB — URINE MICROSCOPIC-ADD ON

## 2011-10-23 LAB — GLUCOSE, CAPILLARY
Glucose-Capillary: 476 mg/dL — ABNORMAL HIGH (ref 70–99)
Glucose-Capillary: 395 mg/dL — ABNORMAL HIGH (ref 70–99)
Glucose-Capillary: 382 mg/dL — ABNORMAL HIGH (ref 70–99)
Glucose-Capillary: 392 mg/dL — ABNORMAL HIGH (ref 70–99)
Glucose-Capillary: 368 mg/dL — ABNORMAL HIGH (ref 70–99)

## 2011-10-23 LAB — URINALYSIS, ROUTINE W REFLEX MICROSCOPIC
Bilirubin Urine: NEGATIVE
Hgb urine dipstick: NEGATIVE
Ketones, ur: NEGATIVE mg/dL
Nitrite: NEGATIVE
Protein, ur: NEGATIVE mg/dL
Urobilinogen, UA: 0.2 mg/dL (ref 0.0–1.0)

## 2011-10-23 LAB — HEMOGLOBIN A1C: Mean Plasma Glucose: 309 mg/dL — ABNORMAL HIGH (ref <117)

## 2011-10-23 MED ORDER — INSULIN NPH (HUMAN) (ISOPHANE) 100 UNIT/ML ~~LOC~~ SUSP
16.0000 [IU] | Freq: Two times a day (BID) | SUBCUTANEOUS | Status: DC
  Administered 2011-10-23: 16 [IU] via SUBCUTANEOUS
  Filled 2011-10-23: qty 10

## 2011-10-23 MED ORDER — INSULIN GLARGINE 100 UNIT/ML ~~LOC~~ SOLN: 20.0000 [IU] | Freq: Every day | SUBCUTANEOUS | Status: DC

## 2011-10-23 MED ORDER — TECHNETIUM TC 99M TETROFOSMIN IV KIT
30.0000 | PACK | Freq: Once | INTRAVENOUS | Status: AC | PRN
  Administered 2011-10-23: 30 via INTRAVENOUS

## 2011-10-23 MED ORDER — HYDROMORPHONE HCL PF 1 MG/ML IJ SOLN: 1.0000 mg | INTRAMUSCULAR | Status: DC | PRN

## 2011-10-23 MED ORDER — TRAMADOL HCL 50 MG PO TABS
100.0000 mg | ORAL_TABLET | Freq: Four times a day (QID) | ORAL | Status: DC
  Administered 2011-10-23 – 2011-10-24 (×6): 100 mg via ORAL
  Filled 2011-10-23 (×9): qty 2

## 2011-10-23 MED ORDER — OXYCODONE HCL 5 MG PO TABS: 5.0000 mg | ORAL_TABLET | ORAL | Status: DC | PRN

## 2011-10-23 MED ORDER — INFLUENZA VIRUS VACC SPLIT PF IM SUSP
0.5000 mL | INTRAMUSCULAR | Status: AC
  Administered 2011-10-24: 0.5 mL via INTRAMUSCULAR
  Filled 2011-10-23: qty 0.5

## 2011-10-23 MED ORDER — INSULIN NPH (HUMAN) (ISOPHANE) 100 UNIT/ML ~~LOC~~ SUSP: 16.0000 [IU] | Freq: Two times a day (BID) | SUBCUTANEOUS | Status: DC

## 2011-10-23 MED ORDER — OXYCODONE HCL 5 MG PO TABS
10.0000 mg | ORAL_TABLET | ORAL | Status: DC | PRN
  Administered 2011-10-23 – 2011-10-24 (×3): 10 mg via ORAL
  Filled 2011-10-23 (×2): qty 2

## 2011-10-23 MED ORDER — INSULIN ASPART 100 UNIT/ML ~~LOC~~ SOLN
0.0000 [IU] | Freq: Three times a day (TID) | SUBCUTANEOUS | Status: DC
  Administered 2011-10-23 – 2011-10-24 (×5): 15 [IU] via SUBCUTANEOUS
  Administered 2011-10-24: 11 [IU] via SUBCUTANEOUS

## 2011-10-23 MED ORDER — INSULIN ASPART 100 UNIT/ML ~~LOC~~ SOLN
10.0000 [IU] | Freq: Once | SUBCUTANEOUS | Status: AC
  Administered 2011-10-23: 10 [IU] via SUBCUTANEOUS

## 2011-10-23 MED ORDER — INSULIN ASPART 100 UNIT/ML ~~LOC~~ SOLN
18.0000 [IU] | Freq: Once | SUBCUTANEOUS | Status: AC
  Administered 2011-10-23: 18 [IU] via SUBCUTANEOUS
  Filled 2011-10-23: qty 3

## 2011-10-23 MED ORDER — ENOXAPARIN SODIUM 40 MG/0.4ML ~~LOC~~ SOLN
40.0000 mg | SUBCUTANEOUS | Status: DC
  Administered 2011-10-23: 40 mg via SUBCUTANEOUS
  Filled 2011-10-23 (×2): qty 0.4

## 2011-10-23 MED ORDER — INSULIN GLARGINE 100 UNIT/ML ~~LOC~~ SOLN
10.0000 [IU] | Freq: Once | SUBCUTANEOUS | Status: AC
  Administered 2011-10-23: 10 [IU] via SUBCUTANEOUS

## 2011-10-23 MED ORDER — PNEUMOCOCCAL VAC POLYVALENT 25 MCG/0.5ML IJ INJ
0.5000 mL | INJECTION | INTRAMUSCULAR | Status: AC
  Administered 2011-10-24: 0.5 mL via INTRAMUSCULAR
  Filled 2011-10-23: qty 0.5

## 2011-10-23 MED ORDER — WHITE PETROLATUM GEL
Status: AC
  Administered 2011-10-23: 1
  Filled 2011-10-23: qty 5

## 2011-10-23 MED ORDER — INSULIN ASPART 100 UNIT/ML ~~LOC~~ SOLN
5.0000 [IU] | Freq: Three times a day (TID) | SUBCUTANEOUS | Status: DC
  Administered 2011-10-23 – 2011-10-24 (×3): 5 [IU] via SUBCUTANEOUS
  Filled 2011-10-23: qty 3

## 2011-10-23 MED ORDER — INSULIN GLARGINE 100 UNIT/ML ~~LOC~~ SOLN: 15.0000 [IU] | Freq: Every day | SUBCUTANEOUS | Status: DC

## 2011-10-23 MED ORDER — FUROSEMIDE 40 MG PO TABS
40.0000 mg | ORAL_TABLET | Freq: Two times a day (BID) | ORAL | Status: DC
  Administered 2011-10-23 – 2011-10-24 (×3): 40 mg via ORAL
  Filled 2011-10-23 (×5): qty 1

## 2011-10-23 NOTE — Progress Notes (Signed)
Pt down to Nuc Med for myoview.  Upon arrival to radiology department, pt was dizzy and diaphoretic, BP 104/70 HR 74.  I went down to assess pt.  Pt placed on stretcher from wheelchair and states dizziness is subsiding.  BP now 109/79.  Radiology RN with patient.  Dr. Butler Denmark notified.  Bedside RN to do orthostatic vital signs once back on unit.  Roselie Awkward, RN

## 2011-10-23 NOTE — Progress Notes (Signed)
Report called to [employer].  Pt notified family of new room number.  Pt transferred in wheelchair with belongings.  Roselie Awkward, RN

## 2011-10-23 NOTE — Progress Notes (Signed)
Subjective: Still having pain in chest. It is worse when he presses on his chest. He understands that he will need to be on insulin and knows that he can get it from [facility]. He has no insurance and gets his meds from the [facility] list.  Objective: Blood pressure 128/74, pulse 75, temperature 97.7 F (36.5 C), temperature source Oral, resp. rate 25, height 5\' 7"  (1.702 m), weight 132.6 kg (292 lb 5.3 oz), SpO2 95.00%. Weight change:   Intake/Output Summary (Last 24 hours) at 10/23/11 1305 Last data filed at 10/23/11 1100  Gross per 24 hour  Intake    480 ml  Output   1750 ml  Net  -1270 ml    Physical Exam: General appearance: alert and cooperative Head: Normocephalic, without obvious abnormality, atraumatic Throat: lips, mucosa, and tongue normal; teeth and gums normal Lungs: clear to auscultation bilaterally Chest wall: no tenderness, upper sternal and left chest is tender Abdomen: soft, non-tender; bowel sounds normal; no masses,  no organomegaly Extremities: extremities normal, atraumatic, no cyanosis or edema  Lab Results:  [facility] 10/23/11 0621 10/22/11 1313  NA 133* 131*  K 3.7 4.7  CL 98 92*  CO2 24 23  GLUCOSE 377* 585*  BUN 17 25*  CREATININE 0.83 0.97  CALCIUM 9.1 10.2  MG -- --  PHOS -- --    Basename 10/23/11 0621 10/22/11 1313  AST 32 34  ALT 32 38  ALKPHOS 66 91  BILITOT 0.3 0.4  PROT 7.5 8.9*  ALBUMIN 3.6 4.4   No results found for this basename: LIPASE:2,AMYLASE:2 in the last 72 hours  Basename 10/23/11 0621 10/22/11 1313  WBC 10.7* 9.9  NEUTROABS -- 5.5  HGB 14.4 16.6  HCT 40.0 46.5  MCV 81.6 81.0  PLT 287 289    Basename 10/23/11 0621 10/22/11 2259 10/22/11 1314  CKTOTAL 167 180 205  CKMB 1.7 1.8 1.8  CKMBINDEX -- -- --  TROPONINI <0.30 <0.30 <0.30   No components found with this basename: POCBNP:3 No results found for this basename: DDIMER:2 in the last 72 hours No results found for this basename: HGBA1C:2 in the last 72  hours No results found for this basename: CHOL:2,HDL:2,LDLCALC:2,TRIG:2,CHOLHDL:2,LDLDIRECT:2 in the last 72 hours No results found for this basename: TSH,T4TOTAL,FREET3,T3FREE,THYROIDAB in the last 72 hours No results found for this basename: VITAMINB12:2,FOLATE:2,FERRITIN:2,TIBC:2,IRON:2,RETICCTPCT:2 in the last 72 hours  Micro Results: Recent Results (from the past 240 hour(s))  MRSA PCR SCREENING     Status: Normal   Collection Time   10/22/11 10:23 PM      Component Value Range Status Comment   MRSA by PCR NEGATIVE  NEGATIVE  Final     Studies/Results: Dg Chest Port 1 View  10/22/2011  *RADIOLOGY REPORT*  Clinical Data: Chest pain, tightness, and dizziness  PORTABLE CHEST - 1 VIEW  Comparison: October 16, 2010 the  Findings: Mild cardiomegaly is unchanged.  The mediastinum and pulmonary vasculature are within normal limits.  Both lungs are clear.  IMPRESSION: Mild cardiomegaly, stable.  Original Report Authenticated By: Brandon Melnick, M.D.    Medications: Scheduled Meds:    . enoxaparin (LOVENOX) injection  40 mg Subcutaneous Q24H  . gemfibrozil  600 mg Oral BID AC  . influenza  inactive virus vaccine  0.5 mL Intramuscular Tomorrow-1000  . insulin aspart  0-15 Units Subcutaneous TID AC & HS  . insulin aspart  10 Units Subcutaneous Once  . insulin aspart  10 Units Subcutaneous Once  . insulin aspart  10  Units Subcutaneous Once  . insulin aspart  10 Units Subcutaneous Once  . insulin aspart  5 Units Subcutaneous TID WC  . insulin glargine  10 Units Subcutaneous Once  . insulin glargine  5 Units Subcutaneous Once  . insulin NPH  16 Units Subcutaneous BID AC  . lisinopril  20 mg Oral Daily  . metoprolol  50 mg Oral BID  .  morphine injection  4 mg Intravenous Once  .  morphine injection  4 mg Intravenous Once  . nitroGLYCERIN  0.4 mg Transdermal Daily  . pneumococcal 23 valent vaccine  0.5 mL Intramuscular Tomorrow-1000  . sodium chloride  1,000 mL Intravenous Once  .  sodium chloride  3 mL Intravenous Q12H  . traMADol  100 mg Oral Q6H  . white petrolatum      . DISCONTD: insulin aspart  0-15 Units Subcutaneous TID WC  . DISCONTD: insulin glargine  15 Units Subcutaneous QHS  . DISCONTD: insulin glargine  20 Units Subcutaneous QHS  . DISCONTD: insulin glargine  5 Units Subcutaneous QHS  . DISCONTD: insulin glargine  5 Units Subcutaneous QHS  . DISCONTD: insulin NPH  16 Units Subcutaneous BID   Continuous Infusions:  PRN Meds:.acetaminophen, acetaminophen, HYDROmorphone (DILAUDID) injection, ondansetron (ZOFRAN) IV, ondansetron, oxyCODONE, DISCONTD: oxyCODONE  Assessment/Plan:   *Chest pain- with musculoskeletal component. Appreciate Cards eval. Will have stress test tomorrow.  Allergic to ASA therefore cannot give Ibuprofen. Trying Oxycodone and heating pad.    HYPERTENSION, BENIGN ESSENTIAL- controlled with Lisinopril and Metoprolol   Diabetes mellitus type 2, uncontrolled- Hba1c pending. Started NPH. Cont sliding scale for now as he will be NPO tomorrow for stress test.    Cardiomyopathy/ Chronic systolic CHF Obesity, morbid OSA   LOS: 1 day   [facility] [phone] 10/23/2011, 1:05 PM

## 2011-10-23 NOTE — Progress Notes (Signed)
Pt's CBG 464mg /dl Dr Butler Denmark paged with result.  Jeffrey [name] [employer]

## 2011-10-23 NOTE — Progress Notes (Signed)
SPOKE WITH PT CONCERNING HIS MEDS AND HE STATED THAT THE ATTENDING STATED THAT HE WILL BE DC'D ON MEDS OFF THE $4 LIST AND INSULINS THAT WOULD BE CHEAP AT [facility] ALSO.  PT IS OK WITH TAKING CARE OF ALL OF HIS MEDS AT DC.  WILL F/U. Willa Rough 10/23/2011 [phone] OR [phone]

## 2011-10-23 NOTE — Progress Notes (Signed)
  Echocardiogram 2D Echocardiogram has been performed.  Jeffrey [name], Real Cons 10/23/2011, 10:16 AM

## 2011-10-23 NOTE — Progress Notes (Signed)
10/23/11  Spoke with nurse about high CBGs.  Needs increase in basal insulin due to weight. Will start on NPH tonight.  Staff nurse to check CBG when returning from radiology. Smith Mince RN BSN

## 2011-10-23 NOTE — Progress Notes (Signed)
Patient Name: Jeffrey [name] Date of Encounter: 10/23/2011    SUBJECTIVE:. The patient still complains of chest tightness. He has no dyspnea. He still complains of headache and thirst. He was me to speak to his mother.  TELEMETRY:  Normal sinus rhythm.: Filed Vitals:   10/22/11 2200 10/22/11 2220 10/23/11 0438 10/23/11 0800  BP:  135/95 100/49 128/74  Pulse:  108 70 75  Temp:  98.7 F (37.1 C) 97.7 F (36.5 C) 97.7 F (36.5 C)  TempSrc:  Oral Oral Oral  Resp:   18 25  Height: 5\' 7"  (1.702 m)     Weight: 132.6 kg (292 lb 5.3 oz)     SpO2:  99% 97% 95%    Intake/Output Summary (Last 24 hours) at 10/23/11 1106 Last data filed at 10/23/11 0800  Gross per 24 hour  Intake      0 ml  Output   1200 ml  Net  -1200 ml    LABS: Basic Metabolic Panel:  Basename 10/23/11 0621 10/22/11 1313  NA 133* 131*  K 3.7 4.7  CL 98 92*  CO2 24 23  GLUCOSE 377* 585*  BUN 17 25*  CREATININE 0.83 0.97  CALCIUM 9.1 10.2  MG -- --  PHOS -- --   CBC:  Basename 10/23/11 0621 10/22/11 1313  WBC 10.7* 9.9  NEUTROABS -- 5.5  HGB 14.4 16.6  HCT 40.0 46.5  MCV 81.6 81.0  PLT 287 289   Cardiac Enzymes:  Basename 10/23/11 0621 10/22/11 2259 10/22/11 1314  CKTOTAL 167 180 205  CKMB 1.7 1.8 1.8  CKMBINDEX -- -- --  TROPONINI <0.30 <0.30 <0.30    Radiology/Studies:  No evidence of CHF.  Echocardiogram: I briefly reviewed the study while he was being performed, and there is been a dramatic improvement in LV systolic function with EF increasing from 25% to at least 50%. He does have LVH. No obvious regional wall motion  Physical Exam: Blood pressure 128/74, pulse 75, temperature 97.7 F (36.5 C), temperature source Oral, resp. rate 25, height 5\' 7"  (1.702 m), weight 132.6 kg (292 lb 5.3 oz), SpO2 95.00%. Weight change:    S4 gallop.  ASSESSMENT: 1. Diabetes mellitus, poorly controlled, with severe hyperglycemia and multiple complaints likely related to this problem.  2. Nonischemic  cardiomyopathy, with dramatic reversal and systolic dysfunction when compared to the prior echocardiogram from 2012.  3. Chest tightness, continuous, with negative markers. EKG done last evening is slightly different than prior baseline EKGs.    Plan:  1. Repeat EKG.  2. Myocardial perfusion study to rule out ischemia.  Selinda Eon 10/23/2011, 11:06 AM

## 2011-10-24 ENCOUNTER — Inpatient Hospital Stay (HOSPITAL_COMMUNITY): Payer: Self-pay

## 2011-10-24 LAB — GLUCOSE, CAPILLARY
Glucose-Capillary: 386 mg/dL — ABNORMAL HIGH (ref 70–99)
Glucose-Capillary: 375 mg/dL — ABNORMAL HIGH (ref 70–99)

## 2011-10-24 MED ORDER — NAPROXEN 250 MG PO TABS: 250.0000 mg | ORAL_TABLET | Freq: Two times a day (BID) | ORAL | Status: DC | PRN

## 2011-10-24 MED ORDER — NAPROXEN 250 MG PO TABS: 250.0000 mg | ORAL_TABLET | Freq: Two times a day (BID) | ORAL | Status: DC

## 2011-10-24 MED ORDER — NAPROXEN 250 MG PO TABS
250.0000 mg | ORAL_TABLET | Freq: Two times a day (BID) | ORAL | Status: DC | PRN
  Filled 2011-10-24: qty 1

## 2011-10-24 MED ORDER — REGADENOSON 0.4 MG/5ML IV SOLN
0.4000 mg | Freq: Once | INTRAVENOUS | Status: AC
  Administered 2011-10-24: 0.4 mg via INTRAVENOUS

## 2011-10-24 MED ORDER — TECHNETIUM TC 99M TETROFOSMIN IV KIT
30.0000 | PACK | Freq: Once | INTRAVENOUS | Status: AC | PRN
  Administered 2011-10-24: 30 via INTRAVENOUS

## 2011-10-24 MED ORDER — IOHEXOL 300 MG/ML  SOLN
80.0000 mL | Freq: Once | INTRAMUSCULAR | Status: AC | PRN
  Administered 2011-10-24: 80 mL via INTRAVENOUS

## 2011-10-24 MED ORDER — INSULIN NPH (HUMAN) (ISOPHANE) 100 UNIT/ML ~~LOC~~ SUSP
26.0000 [IU] | Freq: Two times a day (BID) | SUBCUTANEOUS | Status: DC
  Administered 2011-10-24: 26 [IU] via SUBCUTANEOUS

## 2011-10-24 MED ORDER — TRAMADOL HCL 50 MG PO TABS: 100.0000 mg | ORAL_TABLET | Freq: Four times a day (QID) | ORAL | Status: DC

## 2011-10-24 MED ORDER — INSULIN NPH ISOPHANE & REGULAR (70-30) 100 UNIT/ML ~~LOC~~ SUSP: 34.0000 [IU] | Freq: Two times a day (BID) | SUBCUTANEOUS | Status: DC

## 2011-10-24 NOTE — Progress Notes (Addendum)
Client to Nuclear Med with RN and on monitor.  Blood sugar 396 and covered per order.  Alert and oriented.   CT scan normal per Dr. Butler Denmark and client can be released.  Verbalizes understanding of discharge instructions.

## 2011-10-24 NOTE — Progress Notes (Signed)
Lexiscan done without complications.  No EKG changes.  Chest pain at rest prior to [facility].  Images to follow.  Darden Palmer MD [facility]

## 2011-10-25 NOTE — Progress Notes (Signed)
   CARE MANAGEMENT NOTE 10/25/2011  Patient:  TRUST, LEH   Account Number:  [id number]  Date Initiated:  10/25/2011  Documentation initiated by:  [facility]  Subjective/Objective Assessment:   DM, HTN     Action/Plan:   Anticipated DC Date:  10/25/2011   Anticipated DC Plan:        DC Planning Services  CM consult      [facility] Choice  HOME HEALTH   Choice offered to / List presented to:  C-1 Patient        HH arranged  HH-2 PT      HH agency  [facility]   Status of service:  Completed, signed off Medicare Important Message given?   (If response is "NO", the following Medicare IM given date fields will be blank) Date Medicare IM given:   Date Additional Medicare IM given:    Discharge Disposition:  HOME W HOME HEALTH SERVICES  Per UR Regulation:    Comments:  10/25/2011 1445 Contacted pt at home. Orders for [facility] PT. Pt states he is interested in agency that will provided financial assistance with [facility] PT. Gave him info about [facility]. Contacted [facility] for [facility] PT. Isidoro Donning RN CCM Case Mgmt phone [phone]

## 2011-10-26 ENCOUNTER — Emergency Department (HOSPITAL_COMMUNITY)
Admission: EM | Admit: 2011-10-26 | Discharge: 2011-10-27 | Disposition: A | Discharge status: Home / Self Care | Payer: Self-pay | Attending: Emergency Medicine | Admitting: Emergency Medicine

## 2011-10-26 ENCOUNTER — Other Ambulatory Visit: Payer: Self-pay

## 2011-10-26 ENCOUNTER — Encounter (HOSPITAL_COMMUNITY): Payer: Self-pay | Admitting: Emergency Medicine

## 2011-10-26 DIAGNOSIS — R1110 Vomiting, unspecified: Secondary | ICD-10-CM

## 2011-10-26 DIAGNOSIS — R51 Headache: Secondary | ICD-10-CM | POA: Insufficient documentation

## 2011-10-26 DIAGNOSIS — I10 Essential (primary) hypertension: Secondary | ICD-10-CM | POA: Insufficient documentation

## 2011-10-26 DIAGNOSIS — R739 Hyperglycemia, unspecified: Secondary | ICD-10-CM

## 2011-10-26 DIAGNOSIS — K5900 Constipation, unspecified: Secondary | ICD-10-CM | POA: Insufficient documentation

## 2011-10-26 DIAGNOSIS — I509 Heart failure, unspecified: Secondary | ICD-10-CM | POA: Insufficient documentation

## 2011-10-26 DIAGNOSIS — R002 Palpitations: Secondary | ICD-10-CM | POA: Insufficient documentation

## 2011-10-26 DIAGNOSIS — R112 Nausea with vomiting, unspecified: Secondary | ICD-10-CM | POA: Insufficient documentation

## 2011-10-26 DIAGNOSIS — G4733 Obstructive sleep apnea (adult) (pediatric): Secondary | ICD-10-CM | POA: Insufficient documentation

## 2011-10-26 DIAGNOSIS — Z79899 Other long term (current) drug therapy: Secondary | ICD-10-CM | POA: Insufficient documentation

## 2011-10-26 DIAGNOSIS — E119 Type 2 diabetes mellitus without complications: Secondary | ICD-10-CM | POA: Insufficient documentation

## 2011-10-26 DIAGNOSIS — Z794 Long term (current) use of insulin: Secondary | ICD-10-CM | POA: Insufficient documentation

## 2011-10-26 DIAGNOSIS — R079 Chest pain, unspecified: Secondary | ICD-10-CM | POA: Insufficient documentation

## 2011-10-26 LAB — BASIC METABOLIC PANEL
BUN: 22 mg/dL (ref 6–23)
Chloride: 94 mEq/L — ABNORMAL LOW (ref 96–112)
Creatinine, Ser: 0.8 mg/dL (ref 0.50–1.35)
Glucose, Bld: 481 mg/dL — ABNORMAL HIGH (ref 70–99)
Potassium: 4.2 mEq/L (ref 3.5–5.1)

## 2011-10-26 LAB — CBC
HCT: 41.7 % (ref 39.0–52.0)
Hemoglobin: 15.3 g/dL (ref 13.0–17.0)
MCH: 29.9 pg (ref 26.0–34.0)
MCHC: 36.7 g/dL — ABNORMAL HIGH (ref 30.0–36.0)
MCV: 81.6 fL (ref 78.0–100.0)

## 2011-10-26 MED ORDER — ONDANSETRON HCL 4 MG/2ML IJ SOLN
4.0000 mg | Freq: Once | INTRAMUSCULAR | Status: AC
  Administered 2011-10-27: 4 mg via INTRAVENOUS
  Filled 2011-10-26: qty 2

## 2011-10-26 MED ORDER — SODIUM CHLORIDE 0.9 % IV BOLUS (SEPSIS)
1000.0000 mL | Freq: Once | INTRAVENOUS | Status: AC
  Administered 2011-10-27: 1000 mL via INTRAVENOUS

## 2011-10-26 MED ORDER — MORPHINE SULFATE 4 MG/ML IJ SOLN
4.0000 mg | Freq: Once | INTRAMUSCULAR | Status: AC
  Administered 2011-10-27: 4 mg via INTRAVENOUS
  Filled 2011-10-26: qty 1

## 2011-10-26 MED ORDER — SODIUM CHLORIDE 0.9 % IV SOLN
INTRAVENOUS | Status: DC
  Administered 2011-10-27: 3.3 [IU]/h via INTRAVENOUS
  Filled 2011-10-26: qty 1

## 2011-10-26 NOTE — ED Notes (Signed)
Pt st's was discharged from hosp on sat. St's he was still having chest pain when he was discharged and has continued.  Pt also c/o sugar being high.

## 2011-10-26 NOTE — ED Provider Notes (Signed)
History     CSN: [id number]  Arrival date & time 10/26/11  2100   First MD Initiated Contact with Patient 10/26/11 2338      Chief Complaint  Patient presents with  . Chest Pain    (Consider location/radiation/quality/duration/timing/severity/associated sxs/prior treatment) Patient is a 36 y.o. male presenting with chest pain. The history is provided by the patient and medical records. The history is limited by the condition of the patient.  Chest Pain    the patient is a 36 year old male, with insulin-dependent diabetes, who presents to emergency department with headache, nausea, vomiting, palpitations, and constipation.  Level V caveat applies for urgent need for intervention, due to his significant hyperglycemia.  He denies fevers, chills, cough, or shortness of breath.  He has had polydipsia and polyuria.  He was recently discharged 3 days ago for similar symptoms.  He states his blood sugar was greater than 300 when he was discharged.  He does have his insulin and metformin and has not missed any dosages.  He denies smoking or alcohol use.  Past Medical History  Diagnosis Date  . Diabetes mellitus   . CHF (congestive heart failure)     EF 20-25%  . Hypertension   . Obesity   . Obstructive sleep apnea     Past Surgical History  Procedure Date  . Appendectomy     No family history on file.  History  Substance Use Topics  . Smoking status: Former Games developer  . Smokeless tobacco: Not on file  . Alcohol Use: No     quit etoh 2011      Review of Systems  Unable to perform ROS Cardiovascular: Positive for chest pain.    Allergies  Aspirin  Home Medications   Current Outpatient Rx  Name Route Sig Dispense Refill  . FUROSEMIDE 40 MG PO TABS Oral Take 40 mg by mouth 2 (two) times daily.     [name] GEMFIBROZIL 600 MG PO TABS Oral Take 600 mg by mouth 2 (two) times daily before a meal.    . INSULIN ISOPHANE & REGULAR (70-30) 100 UNIT/ML [place] SUSP Subcutaneous Inject 34  Units into the skin 2 (two) times daily with a meal. 10 mL 12  . LISINOPRIL 20 MG PO TABS Oral Take 20 mg by mouth daily.    [name] METFORMIN HCL ER (OSM) 500 MG PO TB24 Oral Take 500 mg by mouth 2 (two) times daily.     [name] METOPROLOL TARTRATE 50 MG PO TABS Oral Take 50 mg by mouth 2 (two) times daily.    . TRAMADOL HCL 50 MG PO TABS Oral Take 100 mg by mouth every 6 (six) hours. For pain      BP 133/96  Pulse 76  Temp(Src) 98.7 F (37.1 C) (Oral)  Resp 19  Ht 5\' 7"  (1.702 m)  Wt 295 lb (133.811 kg)  BMI 46.20 kg/m2  SpO2 96%  Physical Exam  Vitals reviewed. Constitutional: He is oriented to person, place, and time. He appears well-developed and well-nourished.       Morbidly obese  HENT:  Head: Normocephalic and atraumatic.  Eyes: Conjunctivae are normal. Pupils are equal, round, and reactive to light.  Neck: Normal range of motion. Neck supple.  Cardiovascular: Normal rate.   No murmur heard. Pulmonary/Chest: Effort normal. No respiratory distress.  Abdominal: Soft. Bowel sounds are normal. There is tenderness. There is no rebound and no guarding.       Mild epigastric and left upper quadrant tenderness  without peritoneal signs  Musculoskeletal: Normal range of motion.  Neurological: He is alert and oriented to person, place, and time.  Skin: Skin is warm and dry.  Psychiatric: He has a normal mood and affect.    ED Course  Procedures (including critical care time) 68, showing male, with insulin-dependent diabetes.  Presents with headache, nausea, vomiting, and hyperglycemia.  We will establish an IV gave him insulin via the IV and perform laboratory testing to determine whether or not.  He is in DKA.  Labs Reviewed  CBC - Abnormal; Notable for the following:    MCHC 36.7 (*)    All other components within normal limits  GLUCOSE, CAPILLARY - Abnormal; Notable for the following:    Glucose-Capillary 460 (*)    All other components within normal limits  BASIC METABOLIC PANEL    POCT CBG MONITORING  CBC  DIFFERENTIAL  URINALYSIS, ROUTINE W REFLEX MICROSCOPIC   No results found.   No diagnosis found.  6:28 AM sxs improved.  cbg = 203.    CRITICAL CARE Performed by: Nicholes Stairs   Total critical care time: 30 min  Critical care time was exclusive of separately billable procedures and treating other patients.  Critical care was necessary to treat or prevent imminent or life-threatening deterioration.  Critical care was time spent personally by me on the following activities: development of treatment plan with patient and/or surrogate as well as nursing, discussions with consultants, evaluation of patient's response to treatment, examination of patient, obtaining history from patient or surrogate, ordering and performing treatments and interventions, ordering and review of laboratory studies, ordering and review of radiographic studies, pulse oximetry and re-evaluation of patient's condition.  MDM  Hyperglycemia- improved. No dka.   Ha. No neuro deficits or ams.  No signs systemic or cns infection Vomiting resolved.        Nicholes Stairs, MD 10/27/11 [phone]

## 2011-10-27 ENCOUNTER — Emergency Department (HOSPITAL_COMMUNITY): Payer: Self-pay

## 2011-10-27 LAB — CBC
HCT: 42.9 % (ref 39.0–52.0)
Hemoglobin: 15.5 g/dL (ref 13.0–17.0)
MCH: 29.6 pg (ref 26.0–34.0)
MCV: 82 fL (ref 78.0–100.0)
RBC: 5.23 MIL/uL (ref 4.22–5.81)

## 2011-10-27 LAB — URINALYSIS, ROUTINE W REFLEX MICROSCOPIC
Bilirubin Urine: NEGATIVE
Glucose, UA: >1000 mg/dL — AB
Hgb urine dipstick: NEGATIVE
Ketones, ur: NEGATIVE mg/dL
Protein, ur: NEGATIVE mg/dL

## 2011-10-27 LAB — GLUCOSE, CAPILLARY
Glucose-Capillary: 386 mg/dL — ABNORMAL HIGH (ref 70–99)
Glucose-Capillary: 233 mg/dL — ABNORMAL HIGH (ref 70–99)

## 2011-10-27 LAB — DIFFERENTIAL
Eosinophils Absolute: 0.2 10*3/uL (ref 0.0–0.7)
Eosinophils Relative: 2 % (ref 0–5)
Lymphs Abs: 2.9 10*3/uL (ref 0.7–4.0)
Monocytes Absolute: 0.6 10*3/uL (ref 0.1–1.0)
Monocytes Relative: 8 % (ref 3–12)

## 2011-10-27 LAB — URINE MICROSCOPIC-ADD ON

## 2011-10-27 MED ORDER — MORPHINE SULFATE 4 MG/ML IJ SOLN
4.0000 mg | Freq: Once | INTRAMUSCULAR | Status: AC
  Administered 2011-10-27: 4 mg via INTRAVENOUS
  Filled 2011-10-27: qty 1

## 2011-10-27 MED ORDER — POTASSIUM CHLORIDE CRYS ER 20 MEQ PO TBCR
40.0000 meq | EXTENDED_RELEASE_TABLET | Freq: Once | ORAL | Status: AC
  Administered 2011-10-27: 40 meq via ORAL
  Filled 2011-10-27: qty 2

## 2011-10-27 MED ORDER — GLYCOPYRROLATE 1 MG PO TABS: ORAL_TABLET | ORAL | Status: DC

## 2011-10-27 MED ORDER — LORAZEPAM 2 MG/ML IJ SOLN
1.0000 mg | Freq: Once | INTRAMUSCULAR | Status: AC
  Administered 2011-10-27: 1 mg via INTRAVENOUS
  Filled 2011-10-27: qty 1

## 2011-10-27 MED ORDER — PROMETHAZINE HCL 25 MG PO TABS: 25.0000 mg | ORAL_TABLET | Freq: Four times a day (QID) | ORAL | Status: DC | PRN

## 2011-10-27 NOTE — ED Notes (Signed)
rx x 2, pt voiced understanding to f/u with PCP 

## 2011-10-27 NOTE — ED Notes (Signed)
Pt c/o CP and HA for the past 2 days.  States associated with constipation.  Tramadol at home not helping with pain.

## 2011-11-09 ENCOUNTER — Inpatient Hospital Stay (HOSPITAL_COMMUNITY)
Admission: EM | Admit: 2011-11-09 | Discharge: 2011-11-17 | DRG: 554 | Disposition: A | Discharge status: Home / Self Care | Payer: Self-pay | Source: Ambulatory Visit | Attending: Family Medicine | Admitting: Family Medicine

## 2011-11-09 ENCOUNTER — Emergency Department (HOSPITAL_COMMUNITY): Payer: Self-pay

## 2011-11-09 ENCOUNTER — Encounter (HOSPITAL_COMMUNITY): Payer: Self-pay | Admitting: Emergency Medicine

## 2011-11-09 DIAGNOSIS — D72829 Elevated white blood cell count, unspecified: Secondary | ICD-10-CM | POA: Diagnosis present

## 2011-11-09 DIAGNOSIS — G4733 Obstructive sleep apnea (adult) (pediatric): Secondary | ICD-10-CM | POA: Diagnosis present

## 2011-11-09 DIAGNOSIS — I10 Essential (primary) hypertension: Secondary | ICD-10-CM | POA: Diagnosis present

## 2011-11-09 DIAGNOSIS — E669 Obesity, unspecified: Secondary | ICD-10-CM

## 2011-11-09 DIAGNOSIS — IMO0001 Reserved for inherently not codable concepts without codable children: Secondary | ICD-10-CM | POA: Diagnosis present

## 2011-11-09 DIAGNOSIS — I509 Heart failure, unspecified: Secondary | ICD-10-CM | POA: Diagnosis present

## 2011-11-09 DIAGNOSIS — Z87891 Personal history of nicotine dependence: Secondary | ICD-10-CM

## 2011-11-09 DIAGNOSIS — I428 Other cardiomyopathies: Secondary | ICD-10-CM | POA: Diagnosis present

## 2011-11-09 DIAGNOSIS — I5022 Chronic systolic (congestive) heart failure: Secondary | ICD-10-CM | POA: Diagnosis present

## 2011-11-09 DIAGNOSIS — E785 Hyperlipidemia, unspecified: Secondary | ICD-10-CM | POA: Diagnosis present

## 2011-11-09 DIAGNOSIS — Z794 Long term (current) use of insulin: Secondary | ICD-10-CM

## 2011-11-09 DIAGNOSIS — K625 Hemorrhage of anus and rectum: Secondary | ICD-10-CM | POA: Diagnosis present

## 2011-11-09 DIAGNOSIS — M109 Gout, unspecified: Principal | ICD-10-CM | POA: Diagnosis present

## 2011-11-09 DIAGNOSIS — K5900 Constipation, unspecified: Secondary | ICD-10-CM | POA: Diagnosis present

## 2011-11-09 DIAGNOSIS — K921 Melena: Secondary | ICD-10-CM

## 2011-11-09 DIAGNOSIS — E1165 Type 2 diabetes mellitus with hyperglycemia: Secondary | ICD-10-CM

## 2011-11-09 DIAGNOSIS — R7309 Other abnormal glucose: Secondary | ICD-10-CM

## 2011-11-09 DIAGNOSIS — Z79899 Other long term (current) drug therapy: Secondary | ICD-10-CM

## 2011-11-09 MED ORDER — ONDANSETRON HCL 4 MG/2ML IJ SOLN
4.0000 mg | Freq: Once | INTRAMUSCULAR | Status: AC
  Administered 2011-11-10: 4 mg via INTRAVENOUS
  Filled 2011-11-09: qty 2

## 2011-11-09 MED ORDER — HYDROMORPHONE HCL PF 1 MG/ML IJ SOLN
1.0000 mg | Freq: Once | INTRAMUSCULAR | Status: AC
  Administered 2011-11-10: 1 mg via INTRAVENOUS
  Filled 2011-11-09: qty 1

## 2011-11-09 NOTE — ED Notes (Signed)
PT. REPORTS LEFT ANKLE PAIN RADIATING TO LEFT FOOT AND LEFT KNEE FOR 3 DAYS WITH SWELLING , DENIES INJURY OR FALL.

## 2011-11-09 NOTE — ED Notes (Signed)
The pt continues to c/o much pain in  His lt foot and ankle.  He continues to have a low grade temp

## 2011-11-10 ENCOUNTER — Encounter (HOSPITAL_COMMUNITY): Payer: Self-pay | Admitting: Internal Medicine

## 2011-11-10 DIAGNOSIS — M79609 Pain in unspecified limb: Secondary | ICD-10-CM

## 2011-11-10 DIAGNOSIS — M109 Gout, unspecified: Secondary | ICD-10-CM | POA: Diagnosis present

## 2011-11-10 DIAGNOSIS — M7989 Other specified soft tissue disorders: Secondary | ICD-10-CM

## 2011-11-10 LAB — CBC
HCT: 39.7 % (ref 39.0–52.0)
Hemoglobin: 13.5 g/dL (ref 13.0–17.0)
MCHC: 33.9 g/dL (ref 30.0–36.0)
MCV: 83.4 fL (ref 78.0–100.0)
Platelets: 317 10*3/uL (ref 150–400)
RDW: 13.2 % (ref 11.5–15.5)
RDW: 13.4 % (ref 11.5–15.5)
WBC: 12 10*3/uL — ABNORMAL HIGH (ref 4.0–10.5)
WBC: 14.2 10*3/uL — ABNORMAL HIGH (ref 4.0–10.5)

## 2011-11-10 LAB — POCT I-STAT, CHEM 8
BUN: 14 mg/dL (ref 6–23)
Calcium, Ion: 1.23 mmol/L (ref 1.12–1.32)
Chloride: 101 mEq/L (ref 96–112)
Potassium: 4.1 mEq/L (ref 3.5–5.1)

## 2011-11-10 LAB — GLUCOSE, CAPILLARY
Glucose-Capillary: 422 mg/dL — ABNORMAL HIGH (ref 70–99)
Glucose-Capillary: 417 mg/dL — ABNORMAL HIGH (ref 70–99)

## 2011-11-10 LAB — CK: Total CK: 177 U/L (ref 7–232)

## 2011-11-10 LAB — URIC ACID: Uric Acid, Serum: 9.8 mg/dL — ABNORMAL HIGH (ref 4.0–7.8)

## 2011-11-10 LAB — DIFFERENTIAL
Basophils Absolute: 0.1 10*3/uL (ref 0.0–0.1)
Lymphocytes Relative: 25 % (ref 12–46)
Monocytes Absolute: 1 10*3/uL (ref 0.1–1.0)
Neutro Abs: 9.6 10*3/uL — ABNORMAL HIGH (ref 1.7–7.7)

## 2011-11-10 LAB — HEMOGLOBIN A1C: Mean Plasma Glucose: 332 mg/dL — ABNORMAL HIGH (ref <117)

## 2011-11-10 LAB — CREATININE, SERUM: Creatinine, Ser: 0.77 mg/dL (ref 0.50–1.35)

## 2011-11-10 MED ORDER — INSULIN ASPART PROT & ASPART (70-30 MIX) 100 UNIT/ML ~~LOC~~ SUSP
34.0000 [IU] | Freq: Two times a day (BID) | SUBCUTANEOUS | Status: DC
  Administered 2011-11-10: 34 [IU] via SUBCUTANEOUS
  Filled 2011-11-10: qty 3

## 2011-11-10 MED ORDER — ONDANSETRON HCL 4 MG PO TABS: 4.0000 mg | ORAL_TABLET | Freq: Four times a day (QID) | ORAL | Status: DC | PRN

## 2011-11-10 MED ORDER — LISINOPRIL 20 MG PO TABS
20.0000 mg | ORAL_TABLET | Freq: Every day | ORAL | Status: DC
  Administered 2011-11-10 – 2011-11-17 (×8): 20 mg via ORAL
  Filled 2011-11-10 (×8): qty 1

## 2011-11-10 MED ORDER — ONDANSETRON HCL 4 MG/2ML IJ SOLN
4.0000 mg | Freq: Once | INTRAMUSCULAR | Status: AC
  Administered 2011-11-10: 4 mg via INTRAVENOUS
  Filled 2011-11-10: qty 2

## 2011-11-10 MED ORDER — SODIUM CHLORIDE 0.9 % IV SOLN
250.0000 mL | INTRAVENOUS | Status: DC | PRN
  Administered 2011-11-12: 250 mL via INTRAVENOUS

## 2011-11-10 MED ORDER — SODIUM CHLORIDE 0.9 % IJ SOLN: 3.0000 mL | INTRAMUSCULAR | Status: DC | PRN

## 2011-11-10 MED ORDER — INSULIN ASPART 100 UNIT/ML ~~LOC~~ SOLN
0.0000 [IU] | Freq: Three times a day (TID) | SUBCUTANEOUS | Status: DC
  Administered 2011-11-10: 20 [IU] via SUBCUTANEOUS
  Administered 2011-11-10: 7 [IU] via SUBCUTANEOUS
  Administered 2011-11-11: 15 [IU] via SUBCUTANEOUS
  Administered 2011-11-11: 20 [IU] via SUBCUTANEOUS

## 2011-11-10 MED ORDER — ONDANSETRON HCL 4 MG/2ML IJ SOLN: 4.0000 mg | Freq: Four times a day (QID) | INTRAMUSCULAR | Status: DC | PRN

## 2011-11-10 MED ORDER — METHYLPREDNISOLONE SODIUM SUCC 125 MG IJ SOLR
60.0000 mg | Freq: Three times a day (TID) | INTRAMUSCULAR | Status: DC
  Administered 2011-11-10 – 2011-11-11 (×3): 60 mg via INTRAVENOUS
  Filled 2011-11-10 (×4): qty 0.96

## 2011-11-10 MED ORDER — ENOXAPARIN SODIUM 40 MG/0.4ML ~~LOC~~ SOLN
40.0000 mg | SUBCUTANEOUS | Status: DC
  Administered 2011-11-10 – 2011-11-17 (×8): 40 mg via SUBCUTANEOUS
  Filled 2011-11-10 (×9): qty 0.4

## 2011-11-10 MED ORDER — FUROSEMIDE 40 MG PO TABS
40.0000 mg | ORAL_TABLET | Freq: Two times a day (BID) | ORAL | Status: DC
  Administered 2011-11-10 – 2011-11-17 (×14): 40 mg via ORAL
  Filled 2011-11-10 (×16): qty 1

## 2011-11-10 MED ORDER — METHYLPREDNISOLONE SODIUM SUCC 125 MG IJ SOLR
60.0000 mg | INTRAMUSCULAR | Status: DC
  Administered 2011-11-10 (×3): 60 mg via INTRAVENOUS
  Filled 2011-11-10 (×4): qty 2

## 2011-11-10 MED ORDER — INSULIN ASPART 100 UNIT/ML ~~LOC~~ SOLN
22.0000 [IU] | Freq: Once | SUBCUTANEOUS | Status: AC
  Administered 2011-11-10: 22 [IU] via SUBCUTANEOUS

## 2011-11-10 MED ORDER — METOPROLOL TARTRATE 50 MG PO TABS
50.0000 mg | ORAL_TABLET | Freq: Two times a day (BID) | ORAL | Status: DC
  Administered 2011-11-10 – 2011-11-12 (×6): 50 mg via ORAL
  Filled 2011-11-10 (×8): qty 1

## 2011-11-10 MED ORDER — INSULIN ASPART 100 UNIT/ML ~~LOC~~ SOLN
0.0000 [IU] | Freq: Every day | SUBCUTANEOUS | Status: DC
  Administered 2011-11-10: 5 [IU] via SUBCUTANEOUS
  Filled 2011-11-10: qty 3

## 2011-11-10 MED ORDER — GEMFIBROZIL 600 MG PO TABS
600.0000 mg | ORAL_TABLET | Freq: Two times a day (BID) | ORAL | Status: DC
  Administered 2011-11-10 – 2011-11-17 (×15): 600 mg via ORAL
  Filled 2011-11-10 (×17): qty 1

## 2011-11-10 MED ORDER — SODIUM CHLORIDE 0.9 % IJ SOLN
3.0000 mL | Freq: Two times a day (BID) | INTRAMUSCULAR | Status: DC
  Administered 2011-11-10 – 2011-11-14 (×5): 3 mL via INTRAVENOUS

## 2011-11-10 MED ORDER — HYDROMORPHONE HCL PF 1 MG/ML IJ SOLN
1.0000 mg | Freq: Once | INTRAMUSCULAR | Status: AC
  Administered 2011-11-10: 1 mg via INTRAVENOUS
  Filled 2011-11-10: qty 1

## 2011-11-10 MED ORDER — COLCHICINE 0.6 MG PO TABS
0.6000 mg | ORAL_TABLET | Freq: Two times a day (BID) | ORAL | Status: DC
  Administered 2011-11-10 – 2011-11-17 (×15): 0.6 mg via ORAL
  Filled 2011-11-10 (×16): qty 1

## 2011-11-10 MED ORDER — METFORMIN HCL ER 500 MG PO TB24
1000.0000 mg | ORAL_TABLET | Freq: Every day | ORAL | Status: DC
  Administered 2011-11-10: 1000 mg via ORAL
  Filled 2011-11-10 (×2): qty 2

## 2011-11-10 MED ORDER — INSULIN ASPART PROT & ASPART (70-30 MIX) 100 UNIT/ML ~~LOC~~ SUSP
38.0000 [IU] | Freq: Two times a day (BID) | SUBCUTANEOUS | Status: DC
  Administered 2011-11-10 – 2011-11-11 (×2): 38 [IU] via SUBCUTANEOUS

## 2011-11-10 MED ORDER — HYDROMORPHONE HCL PF 1 MG/ML IJ SOLN
1.0000 mg | INTRAMUSCULAR | Status: DC | PRN
  Administered 2011-11-10 (×2): 2 mg via INTRAVENOUS
  Administered 2011-11-10: 1 mg via INTRAVENOUS
  Administered 2011-11-10: 2 mg via INTRAVENOUS
  Administered 2011-11-10: 1 mg via INTRAVENOUS
  Administered 2011-11-11 – 2011-11-12 (×9): 2 mg via INTRAVENOUS
  Administered 2011-11-12 – 2011-11-13 (×4): 1 mg via INTRAVENOUS
  Administered 2011-11-14: 2 mg via INTRAVENOUS
  Administered 2011-11-14: 1 mg via INTRAVENOUS
  Filled 2011-11-10: qty 2
  Filled 2011-11-10 (×3): qty 1
  Filled 2011-11-10 (×2): qty 2
  Filled 2011-11-10: qty 1
  Filled 2011-11-10 (×11): qty 2
  Filled 2011-11-10: qty 1
  Filled 2011-11-10: qty 2

## 2011-11-10 NOTE — ED Notes (Signed)
The pt says he needs more pain med.  He is still  Having pain

## 2011-11-10 NOTE — Progress Notes (Signed)
Pt transferred from ED, admitted to [id number]. Pt is alert and oriented. Comes from home alone, pt is ambulatory with standby assistance. Left foot is very painful to touch and with movement; +2 edema. Skin is intact, no breakdown noted. Oriented to room, instructed to call for assistance before getting up. VSS.Will continue monitoring. Kerby Moors

## 2011-11-10 NOTE — ED Provider Notes (Signed)
History     CSN: [id number]  Arrival date & time 11/09/11  2057   First MD Initiated Contact with Patient 11/09/11 2330      Chief Complaint  Patient presents with  . Ankle Pain    (Consider location/radiation/quality/duration/timing/severity/associated sxs/prior treatment) HPI Comments: Patient here with progressive worsening of left leg pain - states that the pain started initially in his left foot and ankle and has now radiated up to left knee, thigh, and leg - states no injury to the leg - pain with movement and palpation, reports swelling to the foot and ankle.  Denies redness, edema.  Patient is a 36 y.o. male presenting with ankle pain. The history is provided by the patient. No language interpreter was used.  Ankle Pain  The incident occurred more than 2 days ago. The incident occurred at home. There was no injury mechanism. The pain is present in the left leg. The quality of the pain is described as sharp. The pain is at a severity of 10/10. The pain is severe. The pain has been constant since onset. Pertinent negatives include no numbness, no inability to bear weight, no loss of motion, no muscle weakness, no loss of sensation and no tingling. He reports no foreign bodies present. The symptoms are aggravated by activity, bearing weight and palpation. He has tried nothing for the symptoms. The treatment provided no relief.    Past Medical History  Diagnosis Date  . Diabetes mellitus   . CHF (congestive heart failure)     EF 20-25%  . Hypertension   . Obesity   . Obstructive sleep apnea     Past Surgical History  Procedure Date  . Appendectomy     No family history on file.  History  Substance Use Topics  . Smoking status: Former Games developer  . Smokeless tobacco: Not on file  . Alcohol Use: No     quit etoh 2011      Review of Systems  Constitutional: Negative for fever and chills.  HENT: Negative for congestion, rhinorrhea and neck pain.   Eyes: Negative for  pain.  Respiratory: Negative for chest tightness and shortness of breath.   Cardiovascular: Negative for chest pain.  Gastrointestinal: Negative for nausea, abdominal pain and diarrhea.  Genitourinary: Negative for flank pain.  Musculoskeletal: Positive for myalgias, joint swelling and arthralgias. Negative for back pain.  Skin: Negative for color change, pallor and rash.  Neurological: Negative for tingling, numbness and headaches.  Psychiatric/Behavioral: Negative for confusion.    Allergies  Aspirin  Home Medications   Current Outpatient Rx  Name Route Sig Dispense Refill  . FUROSEMIDE 40 MG PO TABS Oral Take 40 mg by mouth 2 (two) times daily.     [name] GEMFIBROZIL 600 MG PO TABS Oral Take 600 mg by mouth 2 (two) times daily before a meal.    . GLYCOPYRROLATE 1 MG PO TABS  Take 1 po tid prn abdominal pain 12 tablet 0  . INSULIN ISOPHANE & REGULAR (70-30) 100 UNIT/ML [place] SUSP Subcutaneous Inject 34 Units into the skin 2 (two) times daily with a meal. 10 mL 12  . LISINOPRIL 20 MG PO TABS Oral Take 20 mg by mouth daily.    [name] METFORMIN HCL ER (OSM) 500 MG PO TB24 Oral Take 500 mg by mouth 2 (two) times daily.     [name] METOPROLOL TARTRATE 50 MG PO TABS Oral Take 50 mg by mouth 2 (two) times daily.      BP  142/91  Pulse 100  Temp(Src) 100.2 F (37.9 C) (Oral)  Resp 22  SpO2 98%  Physical Exam  Nursing note and vitals reviewed. Constitutional: He is oriented to person, place, and time. He appears well-developed and well-nourished. He appears distressed.  HENT:  Head: Normocephalic and atraumatic.  Right Ear: External ear normal.  Left Ear: External ear normal.  Nose: Nose normal.  Mouth/Throat: Oropharynx is clear and moist. No oropharyngeal exudate.  Eyes: Conjunctivae are normal. Pupils are equal, round, and reactive to light. No scleral icterus.  Neck: Normal range of motion. Neck supple.  Cardiovascular: Normal rate, regular rhythm and normal heart sounds.  Exam reveals no  gallop and no friction rub.   No murmur heard. Pulmonary/Chest: Effort normal and breath sounds normal. No respiratory distress. He exhibits no tenderness.  Abdominal: Soft. Bowel sounds are normal. He exhibits no distension. There is no tenderness.  Musculoskeletal:       Left upper leg: He exhibits tenderness, swelling and edema. He exhibits no deformity.       Left lower leg: He exhibits tenderness and swelling. He exhibits no bony tenderness and no deformity.       Left foot: He exhibits decreased range of motion, tenderness and swelling. He exhibits normal capillary refill.       Pain to palpation of entire left leg including all joints - no erythema noted, 2+ DP and PT pulses -   Lymphadenopathy:    He has no cervical adenopathy.  Neurological: He is alert and oriented to person, place, and time. No cranial nerve deficit.  Skin: Skin is warm and dry. No rash noted. No erythema. No pallor.  Psychiatric: He has a normal mood and affect. His behavior is normal. Judgment and thought content normal.    ED Course  Procedures (including critical care time)  Labs Reviewed  CBC - Abnormal; Notable for the following:    WBC 14.2 (*)    All other components within normal limits  DIFFERENTIAL - Abnormal; Notable for the following:    Neutro Abs 9.6 (*)    All other components within normal limits  POCT I-STAT, CHEM 8 - Abnormal; Notable for the following:    Glucose, Bld 270 (*)    All other components within normal limits   Dg Ankle Complete Left  11/09/2011  *RADIOLOGY REPORT*  Clinical Data: Left ankle and foot pain.  LEFT ANKLE COMPLETE - 3+ VIEW  Comparison: Contemporaneous foot  Findings: Soft tissue swelling overlies the lateral malleolus.  No acute fracture or dislocation identified.  Ankle mortise remains intact.  No aggressive osseous lesion.  IMPRESSION: Soft tissue swelling overlies lateral malleolus.  No acute osseous abnormality identified.  Original Report Authenticated By:  Waneta Martins, M.D.   Dg Foot Complete Left  11/09/2011  *RADIOLOGY REPORT*  Clinical Data: Left ankle and foot pain  LEFT FOOT - COMPLETE 3+ VIEW  Comparison: Contemporaneous ankle  Findings: No acute fracture or dislocation. Lisfranc joint intact. No aggressive osseous lesions. Posterior talar spurring. No radiopaque foreign body  IMPRESSION: No acute osseous abnormality identified.  Original Report Authenticated By: Waneta Martins, M.D.     Gout    MDM  Pain out of proportion to examination and continues despite dilaudid 1mg  IV - will repeat this dose and I have asked Dr. Rubin Payor to examine the patient, extremity is warm to the touch, good pulses so I do not suspect vascular compromise, he does have slight leukocytosis and fever, but I  see no evidence of cellulitis or gout or other inflammatory condition.     Plan to admit the patient - believe this is most likely gout flare, though pain is way out of proportion to the exam.  Dr. Conley Rolls with [facility] in to see the patient.     Izola Price [place], [place] 11/10/11 [phone]

## 2011-11-10 NOTE — Progress Notes (Signed)
*  PRELIMINARY RESULTS* Vascular Ultrasound Left lower extremity venous duplex has been completed.  Preliminary findings: Left leg is negative for deep and superficial vein thrombosis.  Vanna Scotland 11/10/2011, 3:59 PM

## 2011-11-10 NOTE — Progress Notes (Signed)
Utilization review completed.  

## 2011-11-10 NOTE — Progress Notes (Signed)
I have seen and assessed patient and agree with Dr Irwin Brakeman assessment and plan. Will increase 70/30 to 38 uints BID, d/c oral hypoglycemics. Resume home dose lasix. Steriod taper.

## 2011-11-10 NOTE — ED Provider Notes (Signed)
Medical screening examination/treatment/procedure(s) were conducted as a shared visit with non-physician practitioner(s) and myself.  I personally evaluated the patient during the encounter. Left lower sternal he pain. Moderate pain and unable to ambulate. Pain and tenderness following up to his thigh. Uric acid elevated. He'll be admitted  Jeffrey [name]. Rubin Payor, MD 11/10/11 [phone]

## 2011-11-10 NOTE — H&P (Signed)
PCP:   Eustace Moore, MD, MD   Chief Complaint: Acute onset of severe left foot and lower leg pain   HPI: Jeffrey [name] is an 36 y.o. male with history of diabetes, nonischemic cardiomyopathy with ejection fraction of 20-25%, hypertension, obesity, sleep apnea, presents to the emergency room with 2 days history of severe left foot, left ankle, and lower left leg pain. He denied any calf pain. There was no trauma reported. He denied any fever, chills, shortness of breath, nausea, vomiting, or any other symptomology. The onset of his pain was rather acute. Workup in emergency room included a leukocytosis with white count of 14,000, normal renal functions, with negative ankle x-rays. Because he was not able to walk, hospitalist was asked to admit him.  Rewiew of Systems:  The patient denies anorexia, fever, weight loss,, vision loss, decreased hearing, hoarseness, chest pain, syncope, dyspnea on exertion, peripheral edema, balance deficits, hemoptysis, abdominal pain, melena, hematochezia, severe indigestion/heartburn, hematuria, incontinence, genital sores, muscle weakness, suspicious skin lesions, transient blindness, difficulty walking, depression, unusual weight change, abnormal bleeding, enlarged lymph nodes, angioedema, and breast masses.   Past Medical History  Diagnosis Date  . Diabetes mellitus   . CHF (congestive heart failure)     EF 20-25%  . Hypertension   . Obesity   . Obstructive sleep apnea     Past Surgical History  Procedure Date  . Appendectomy     Medications:  HOME MEDS: Prior to Admission medications   Medication Sig Start Date End Date Taking? Authorizing Provider  furosemide (LASIX) 40 MG tablet Take 40 mg by mouth 2 (two) times daily.    Yes Historical Provider, MD  gemfibrozil (LOPID) 600 MG tablet Take 600 mg by mouth 2 (two) times daily before a meal.   Yes Historical Provider, MD  glycopyrrolate (ROBINUL) 1 MG tablet Take 1 po tid prn abdominal pain  10/27/11  Yes Nicholes Stairs, MD  insulin NPH-insulin regular (NOVOLIN 70/30) (70-30) 100 UNIT/ML injection Inject 34 Units into the skin 2 (two) times daily with a meal. 10/24/11 10/23/12 Yes Calvert Cantor, MD  lisinopril (PRINIVIL,ZESTRIL) 20 MG tablet Take 20 mg by mouth daily.   Yes Historical Provider, MD  metformin (FORTAMET) 500 MG (OSM) 24 hr tablet Take 500 mg by mouth 2 (two) times daily.    Yes Historical Provider, MD  metoprolol (LOPRESSOR) 50 MG tablet Take 50 mg by mouth 2 (two) times daily.   Yes Historical Provider, MD     Allergies:  Allergies  Allergen Reactions  . Aspirin Rash    Rash, itching    Social History:   reports that he has quit smoking. He does not have any smokeless tobacco history on file. He reports that he does not drink alcohol or use illicit drugs.  Family History: No family history on file.   Physical Exam: Filed Vitals:   11/09/11 2110 11/09/11 2300 11/10/11 0001 11/10/11 0337  BP: 139/80  142/91 138/86  Pulse: 98  100 97  Temp: 100.2 F (37.9 C) 100.2 F (37.9 C)  99.6 F (37.6 C)  TempSrc: Oral   Oral  Resp: 18  22 20   SpO2: 94%  98% 95%   Blood pressure 138/86, pulse 97, temperature 99.6 F (37.6 C), temperature source Oral, resp. rate 20, SpO2 95.00%.  GEN:  Pleasant person lying in the stretcher in no acute distress; cooperative with exam PSYCH:  alert and oriented x4; does not appear anxious does not appear depressed; affect is  normal HEENT: Mucous membranes pink and anicteric; PERRLA; EOM intact; no cervical lymphadenopathy nor thyromegaly or carotid bruit; no JVD; Breasts:: Not examined CHEST WALL: No tenderness CHEST: Normal respiration, clear to auscultation bilaterally HEART: Regular rate and rhythm; no murmurs rubs or gallops BACK: No kyphosis or scoliosis; no CVA tenderness ABDOMEN: Obese, soft non-tender; no masses, no organomegaly, normal abdominal bowel sounds; no pannus; no intertriginous candida. Rectal Exam: Not  done EXTREMITIES: No bone or joint deformity; age-appropriate arthropathy of the hands and knees; there is soft tissue swelling over his first MTP joint, extending to the dorsum of his foot and ankle. There is tenderness over his distal knee joint as well. No calf tenderness, negative Homans sign, and good capillary refill. Genitalia: not examined PULSES: 2+ and symmetric SKIN: Normal hydration no rash or ulceration CNS: Cranial nerves 2-12 grossly intact no focal neurologic deficit   Labs & Imaging Results for orders placed during the hospital encounter of 11/09/11 (from the past 48 hour(s))  CBC     Status: Abnormal   Collection Time   11/09/11 11:45 PM      Component Value Range Comment   WBC 14.2 (*) 4.0 - 10.5 (K/uL)    RBC 4.73  4.22 - 5.81 (MIL/uL)    Hemoglobin 13.5  13.0 - 17.0 (g/dL)    HCT 09.8  11.9 - 14.7 (%)    MCV 83.9  78.0 - 100.0 (fL)    MCH 28.5  26.0 - 34.0 (pg)    MCHC 34.0  30.0 - 36.0 (g/dL)    RDW 82.9  56.2 - 13.0 (%)    Platelets 327  150 - 400 (K/uL)   DIFFERENTIAL     Status: Abnormal   Collection Time   11/09/11 11:45 PM      Component Value Range Comment   Neutrophils Relative 68  43 - 77 (%)    Neutro Abs 9.6 (*) 1.7 - 7.7 (K/uL)    Lymphocytes Relative 25  12 - 46 (%)    Lymphs Abs 3.5  0.7 - 4.0 (K/uL)    Monocytes Relative 7  3 - 12 (%)    Monocytes Absolute 1.0  0.1 - 1.0 (K/uL)    Eosinophils Relative 1  0 - 5 (%)    Eosinophils Absolute 0.1  0.0 - 0.7 (K/uL)    Basophils Relative 0  0 - 1 (%)    Basophils Absolute 0.1  0.0 - 0.1 (K/uL)   POCT I-STAT, CHEM 8     Status: Abnormal   Collection Time   11/09/11 11:58 PM      Component Value Range Comment   Sodium 138  135 - 145 (mEq/L)    Potassium 4.1  3.5 - 5.1 (mEq/L)    Chloride 101  96 - 112 (mEq/L)    BUN 14  6 - 23 (mg/dL)    Creatinine, Ser 8.65  0.50 - 1.35 (mg/dL)    Glucose, Bld 784 (*) 70 - 99 (mg/dL)    Calcium, Ion 6.96  1.12 - 1.32 (mmol/L)    TCO2 24  0 - 100 (mmol/L)     Hemoglobin 14.3  13.0 - 17.0 (g/dL)    HCT 29.5  28.4 - 13.2 (%)   CK     Status: Normal   Collection Time   11/10/11  2:56 AM      Component Value Range Comment   Total CK 177  7 - 232 (U/L)   URIC ACID  Status: Abnormal   Collection Time   11/10/11  2:56 AM      Component Value Range Comment   Uric Acid, Serum 9.8 (*) 4.0 - 7.8 (mg/dL)    Dg Ankle Complete Left  11/09/2011  *RADIOLOGY REPORT*  Clinical Data: Left ankle and foot pain.  LEFT ANKLE COMPLETE - 3+ VIEW  Comparison: Contemporaneous foot  Findings: Soft tissue swelling overlies the lateral malleolus.  No acute fracture or dislocation identified.  Ankle mortise remains intact.  No aggressive osseous lesion.  IMPRESSION: Soft tissue swelling overlies lateral malleolus.  No acute osseous abnormality identified.  Original Report Authenticated By: Waneta Martins, M.D.   Dg Foot Complete Left  11/09/2011  *RADIOLOGY REPORT*  Clinical Data: Left ankle and foot pain  LEFT FOOT - COMPLETE 3+ VIEW  Comparison: Contemporaneous ankle  Findings: No acute fracture or dislocation. Lisfranc joint intact. No aggressive osseous lesions. Posterior talar spurring. No radiopaque foreign body  IMPRESSION: No acute osseous abnormality identified.  Original Report Authenticated By: Waneta Martins, M.D.      Assessment Present on Admission:  .Gout attack .OBESITY .DIABETES MELLITUS, TYPE II, BORDERLINE .Cardiomyopathy .OSA (obstructive sleep apnea) .CHF   PLAN: I suspect this is a gouty attack. We'll check a uric acid. He will be given steroid, colchicine, and pain medications. Because of his congestive heart failure, he will not receive intravenous fluids. For his diabetes, with steroid, he would need insulin sliding scale. He is otherwise stable, full code, and will be admitted to [facility] hospitalist service.   Other plans as per orders.   Nikeria Kalman 11/10/2011, 5:39 AM

## 2011-11-10 NOTE — Progress Notes (Signed)
Inpatient Diabetes Program Recommendations  AACE/ADA: New Consensus Statement on Inpatient Glycemic Control (2009)  Target Ranges:  Prepandial:   less than 140 mg/dL      Peak postprandial:   less than 180 mg/dL (1-2 hours)      Critically ill patients:  140 - 180 mg/dL   Reason for Visit: Results for Jeffrey [name], Jeffrey [name] (MRN [id number]) as of 11/10/2011 12:58  Ref. Range 10/27/2011 06:25 11/10/2011 06:23 11/10/2011 10:42  Glucose-Capillary Latest Range: 70-99 mg/dL 295 (H) 621 (H) 308 (H)    Inpatient Diabetes Program Recommendations Insulin - Basal: Will likely need increase in 70/30 further due to IV steroids. Correction (SSI): Consider resistant correction q 4 hours while on IV steroids. HgbA1C: A1C=12.4% indicating average CBG's at home=312 mg/dL. Will need close follow-up as outpatient for glycemic control.  Note: Will follow.

## 2011-11-11 LAB — DIFFERENTIAL
Eosinophils Absolute: 0 10*3/uL (ref 0.0–0.7)
Eosinophils Relative: 0 % (ref 0–5)
Lymphocytes Relative: 13 % (ref 12–46)
Lymphs Abs: 2.3 10*3/uL (ref 0.7–4.0)
Monocytes Absolute: 1.2 10*3/uL — ABNORMAL HIGH (ref 0.1–1.0)
Monocytes Relative: 7 % (ref 3–12)

## 2011-11-11 LAB — GLUCOSE, CAPILLARY

## 2011-11-11 LAB — CBC
Hemoglobin: 12.7 g/dL — ABNORMAL LOW (ref 13.0–17.0)
MCH: 28.7 pg (ref 26.0–34.0)
MCHC: 34.1 g/dL (ref 30.0–36.0)
MCV: 84.2 fL (ref 78.0–100.0)
Platelets: 329 10*3/uL (ref 150–400)

## 2011-11-11 LAB — BASIC METABOLIC PANEL
BUN: 18 mg/dL (ref 6–23)
CO2: 25 mEq/L (ref 19–32)
Calcium: 10.4 mg/dL (ref 8.4–10.5)
GFR calc non Af Amer: >90 mL/min (ref >90)
Glucose, Bld: 370 mg/dL — ABNORMAL HIGH (ref 70–99)

## 2011-11-11 MED ORDER — INSULIN ASPART PROT & ASPART (70-30 MIX) 100 UNIT/ML ~~LOC~~ SUSP
44.0000 [IU] | Freq: Two times a day (BID) | SUBCUTANEOUS | Status: DC
  Administered 2011-11-11 – 2011-11-12 (×3): 44 [IU] via SUBCUTANEOUS
  Administered 2011-11-13: 12 [IU] via SUBCUTANEOUS
  Administered 2011-11-13 – 2011-11-17 (×9): 44 [IU] via SUBCUTANEOUS
  Filled 2011-11-11 (×2): qty 3

## 2011-11-11 MED ORDER — PREDNISONE 50 MG PO TABS
50.0000 mg | ORAL_TABLET | Freq: Every day | ORAL | Status: DC
  Administered 2011-11-12 – 2011-11-13 (×2): 50 mg via ORAL
  Filled 2011-11-11 (×3): qty 1

## 2011-11-11 MED ORDER — INSULIN ASPART 100 UNIT/ML ~~LOC~~ SOLN
0.0000 [IU] | SUBCUTANEOUS | Status: DC
Start: 2011-11-11 — End: 2011-11-17
  Administered 2011-11-11: 20 [IU] via SUBCUTANEOUS
  Administered 2011-11-11: 15 [IU] via SUBCUTANEOUS
  Administered 2011-11-12: 20 [IU] via SUBCUTANEOUS
  Administered 2011-11-12: 15 [IU] via SUBCUTANEOUS
  Administered 2011-11-12: 3 [IU] via SUBCUTANEOUS
  Administered 2011-11-12 (×2): 11 [IU] via SUBCUTANEOUS
  Administered 2011-11-12 – 2011-11-13 (×2): 7 [IU] via SUBCUTANEOUS
  Administered 2011-11-13 (×2): 20 [IU] via SUBCUTANEOUS
  Administered 2011-11-13: 15 [IU] via SUBCUTANEOUS
  Administered 2011-11-13 – 2011-11-14 (×3): 7 [IU] via SUBCUTANEOUS
  Administered 2011-11-14: 4 [IU] via SUBCUTANEOUS
  Administered 2011-11-14 (×2): 7 [IU] via SUBCUTANEOUS
  Administered 2011-11-14: 4 [IU] via SUBCUTANEOUS
  Administered 2011-11-14 – 2011-11-15 (×2): 15 [IU] via SUBCUTANEOUS
  Administered 2011-11-15: 4 [IU] via SUBCUTANEOUS
  Administered 2011-11-15: 11 [IU] via SUBCUTANEOUS
  Administered 2011-11-15: 7 [IU] via SUBCUTANEOUS
  Administered 2011-11-15: 4 [IU] via SUBCUTANEOUS
  Administered 2011-11-15: 7 [IU] via SUBCUTANEOUS
  Administered 2011-11-16: 4 [IU] via SUBCUTANEOUS
  Administered 2011-11-16: 0 [IU] via SUBCUTANEOUS
  Administered 2011-11-16 (×3): 4 [IU] via SUBCUTANEOUS
  Administered 2011-11-16 – 2011-11-17 (×3): 3 [IU] via SUBCUTANEOUS
  Administered 2011-11-17: 4 [IU] via SUBCUTANEOUS
  Filled 2011-11-11: qty 3

## 2011-11-11 NOTE — Progress Notes (Signed)
Subjective: Pt mentions that his pain is better controlled today.  Has improved on the current regimen reportedly.  He is also worried about his blood sugars being that he was recently diagnosed with diabetes.  Otherwise no acute issues overnight.  Objective: Filed Vitals:   11/10/11 1700 11/10/11 2230 11/11/11 0540 11/11/11 1457  BP:  134/80 128/85 115/73  Pulse:  101 63 71  Temp:  97.9 F (36.6 C) 97.8 F (36.6 C) 97.7 F (36.5 C)  TempSrc:  Oral  Oral  Resp:  18 16 18   Height: 5\' 7"  (1.702 m)     Weight: 137 kg (302 lb 0.5 oz)     SpO2:  92% 99% 95%   Weight change: 0 kg (0 lb)  Intake/Output Summary (Last 24 hours) at 11/11/11 1713 Last data filed at 11/11/11 1400  Gross per 24 hour  Intake    240 ml  Output   3050 ml  Net  -2810 ml    General: Alert, awake, oriented x3, in no acute distress.  HEENT: No bruits, no goiter.  Heart: Regular rate and rhythm, without murmurs, rubs, gallops.  Lungs: CTA BL Abdomen: Soft, nontender, nondistended, positive bowel sounds.  Extremities: Discomfort at left ankle with palpation.  It is more edematous than the right no erythema. Neuro: Grossly intact, nonfocal.   Lab Results:  Basename 11/11/11 0545 11/10/11 0519 11/09/11 2358  NA 135 -- 138  K 4.8 -- 4.1  CL 102 -- 101  CO2 25 -- --  GLUCOSE 370* -- 270*  BUN 18 -- 14  CREATININE 0.77 0.77 --  CALCIUM 10.4 -- --  MG -- -- --  PHOS -- -- --   No results found for this basename: AST:2,ALT:2,ALKPHOS:2,BILITOT:2,PROT:2,ALBUMIN:2 in the last 72 hours No results found for this basename: LIPASE:2,AMYLASE:2 in the last 72 hours  Basename 11/11/11 0545 11/10/11 0700 11/09/11 2345  WBC 17.9* 12.0* --  NEUTROABS 14.4* -- 9.6*  HGB 12.7* 12.8* --  HCT 37.2* 37.8* --  MCV 84.2 83.4 --  PLT 329 317 --    Basename 11/10/11 0256  CKTOTAL 177  CKMB --  CKMBINDEX --  TROPONINI --   No components found with this basename: POCBNP:3 No results found for this basename: DDIMER:2  in the last 72 hours  Basename 11/10/11 0700  HGBA1C 13.2*   No results found for this basename: CHOL:2,HDL:2,LDLCALC:2,TRIG:2,CHOLHDL:2,LDLDIRECT:2 in the last 72 hours No results found for this basename: TSH,T4TOTAL,FREET3,T3FREE,THYROIDAB in the last 72 hours No results found for this basename: VITAMINB12:2,FOLATE:2,FERRITIN:2,TIBC:2,IRON:2,RETICCTPCT:2 in the last 72 hours  Micro Results: No results found for this or any previous visit (from the past 240 hour(s)).  Studies/Results: Dg Ankle Complete Left  11/09/2011  *RADIOLOGY REPORT*  Clinical Data: Left ankle and foot pain.  LEFT ANKLE COMPLETE - 3+ VIEW  Comparison: Contemporaneous foot  Findings: Soft tissue swelling overlies the lateral malleolus.  No acute fracture or dislocation identified.  Ankle mortise remains intact.  No aggressive osseous lesion.  IMPRESSION: Soft tissue swelling overlies lateral malleolus.  No acute osseous abnormality identified.  Original Report Authenticated By: Waneta Martins, M.D.   Dg Foot Complete Left  11/09/2011  *RADIOLOGY REPORT*  Clinical Data: Left ankle and foot pain  LEFT FOOT - COMPLETE 3+ VIEW  Comparison: Contemporaneous ankle  Findings: No acute fracture or dislocation. Lisfranc joint intact. No aggressive osseous lesions. Posterior talar spurring. No radiopaque foreign body  IMPRESSION: No acute osseous abnormality identified.  Original Report Authenticated By: Waneta Martins,  M.D.    Medications: I have reviewed the patient's current medications.   Patient Active Hospital Problem List: Gout attack (11/10/2011) Pt is allergic to ASA, thus will not start Indomethacin.  Patient is getting colchicine BID.  Will plan on changing his prednisone to oral today.  Will continue to monitor  Leukocytosis:  Likely related to recent steroid administration.  Given lack of rubor and calor don't feel that antibiotics are warranted at this time.  Will continue to monitor his WBC and  Temps.  OBESITY (12/23/2010) Recommend diet  CHF (12/23/2010) Stable at this juncture. Pt is on lasix 40 mg bid.  On B blocker and Ace I  DIABETES MELLITUS, TYPE II, BORDERLINE (12/23/2010) Pt's blood sugars have ranged while in house.  Given recent steroid administration suspect that patient's blood sugars should drop. Will decrease steroids as indicated above. Continue to monitor blood sugars and will change medication pending results.  Cardiomyopathy (10/22/2011) Stable currently  OSA (obstructive sleep apnea) (10/23/2011) Stable currently.  Pt may have to f/u as outpatient.     LOS: 2 days   Penny Pia M.D.  [facility] 11/11/2011, 5:13 PM

## 2011-11-11 NOTE — Progress Notes (Signed)
   CARE MANAGEMENT NOTE 11/11/2011  Patient:  Jeffrey [name], Jeffrey [name]   Account Number:  [id number]  Date Initiated:  11/11/2011  Documentation initiated by:  Letha Cape  Subjective/Objective Assessment:   dx gout attack  observation     Action/Plan:   Anticipated DC Date:  11/12/2011   Anticipated DC Plan:  HOME/SELF CARE      DC Planning Services  CM consult      Choice offered to / List presented to:             Status of service:  In process, will continue to follow Medicare Important Message given?   (If response is "NO", the following Medicare IM given date fields will be blank) Date Medicare IM given:   Date Additional Medicare IM given:    Discharge Disposition:    Per UR Regulation:    Comments:  11/11/11 16;57 Letha Cape RN, BSN [phone] NCM will continue to follow for dc needs.

## 2011-11-11 NOTE — Progress Notes (Signed)
Pt's cbg prior to lunch was 418, Dr. Cena Benton notified, he said to give 20 units novolog

## 2011-11-12 LAB — CBC
HCT: 36.5 % — ABNORMAL LOW (ref 39.0–52.0)
Hemoglobin: 12.4 g/dL — ABNORMAL LOW (ref 13.0–17.0)
MCHC: 34 g/dL (ref 30.0–36.0)
Platelets: 386 10*3/uL (ref 150–400)
RBC: 4.33 MIL/uL (ref 4.22–5.81)
RDW: 13.3 % (ref 11.5–15.5)
WBC: 17.5 10*3/uL — ABNORMAL HIGH (ref 4.0–10.5)

## 2011-11-12 LAB — GLUCOSE, CAPILLARY
Glucose-Capillary: 121 mg/dL — ABNORMAL HIGH (ref 70–99)
Glucose-Capillary: 279 mg/dL — ABNORMAL HIGH (ref 70–99)

## 2011-11-12 MED ORDER — INDOMETHACIN 50 MG PO CAPS
50.0000 mg | ORAL_CAPSULE | Freq: Three times a day (TID) | ORAL | Status: DC
  Administered 2011-11-12 – 2011-11-17 (×14): 50 mg via ORAL
  Filled 2011-11-12 (×20): qty 1

## 2011-11-12 NOTE — Progress Notes (Signed)
   CARE MANAGEMENT NOTE 11/12/2011  Patient:  Jeffrey [name], Jeffrey [name]   Account Number:  [id number]  Date Initiated:  11/11/2011  Documentation initiated by:  Letha Cape  Subjective/Objective Assessment:   dx gout attack  observation     Action/Plan:   PTA pt lived at home, was independent with ADLs   Anticipated DC Date:  11/12/2011   Anticipated DC Plan:  HOME/SELF CARE      DC Planning Services  CM consult      Choice offered to / List presented to:             Status of service:  In process, will continue to follow Medicare Important Message given?   (If response is "NO", the following Medicare IM given date fields will be blank) Date Medicare IM given:   Date Additional Medicare IM given:    Discharge Disposition:    Per UR Regulation:    Comments:  PCP- [facility] -Dr. Philipp Deputy (next appointment is 12/02/11- 8:00)  Contact- Chriss Driver (mother)- [phone]  11/12/11- 1130- Donn Pierini RN, BSN [phone] Spoke with pt at bedside- per conversation pt states that he has a PCP at [facility], he states that his orange card is current/active and that he gets his medications at [facility] on [address]. Per pharmacy pt is eligible for assistance with meds if needed through the indigent fund. CM to follow.   11/11/11 16;57 Letha Cape RN, BSN [phone] NCM will continue to follow for dc needs.

## 2011-11-12 NOTE — Progress Notes (Signed)
Utilization review complete 

## 2011-11-12 NOTE — Progress Notes (Signed)
Subjective: Pt mentions that he feels about the same today.  I asked the patient about his aspirin allergy and he mentions that when he was small he got a rash.  When asked if he had ever taken an NSAID he mentions that he took ibuprofen for headaches without any adverse reactions.  No acute issue overnight.  Objective: Filed Vitals:   11/11/11 2030 11/12/11 0726 11/12/11 1056 11/12/11 1059  BP: 141/81 139/99 110/80 110/80  Pulse: 78 64 62   Temp: 97.7 F (36.5 C) 97.7 F (36.5 C)    TempSrc:      Resp: 18 18    Height:      Weight:      SpO2: 100% 99%     Weight change:   Intake/Output Summary (Last 24 hours) at 11/12/11 1339 Last data filed at 11/12/11 1106  Gross per 24 hour  Intake    480 ml  Output   4400 ml  Net  -3920 ml    General: Alert, awake, oriented x3, in no acute distress.  HEENT: No bruits, no goiter.  Heart: Regular rate and rhythm, without murmurs, rubs, gallops.  Lungs: Clear to auscultation BL  Abdomen: Soft, nontender, nondistended, positive bowel sounds.  Neuro: Grossly intact, nonfocal. Extremity:  Pain with palpation over left ankle no pain on palpation over right ankle. No erythema or cellulitis    Lab Results:  Basename 11/11/11 0545 11/10/11 0519 11/09/11 2358  NA 135 -- 138  K 4.8 -- 4.1  CL 102 -- 101  CO2 25 -- --  GLUCOSE 370* -- 270*  BUN 18 -- 14  CREATININE 0.77 0.77 --  CALCIUM 10.4 -- --  MG -- -- --  PHOS -- -- --   No results found for this basename: AST:2,ALT:2,ALKPHOS:2,BILITOT:2,PROT:2,ALBUMIN:2 in the last 72 hours No results found for this basename: LIPASE:2,AMYLASE:2 in the last 72 hours  Basename 11/12/11 0600 11/11/11 0545 11/09/11 2345  WBC 17.5* 17.9* --  NEUTROABS -- 14.4* 9.6*  HGB 12.4* 12.7* --  HCT 36.5* 37.2* --  MCV 84.3 84.2 --  PLT 386 329 --    Basename 11/10/11 0256  CKTOTAL 177  CKMB --  CKMBINDEX --  TROPONINI --   No components found with this basename: POCBNP:3 No results found for this  basename: DDIMER:2 in the last 72 hours  Basename 11/10/11 0700  HGBA1C 13.2*   No results found for this basename: CHOL:2,HDL:2,LDLCALC:2,TRIG:2,CHOLHDL:2,LDLDIRECT:2 in the last 72 hours No results found for this basename: TSH,T4TOTAL,FREET3,T3FREE,THYROIDAB in the last 72 hours No results found for this basename: VITAMINB12:2,FOLATE:2,FERRITIN:2,TIBC:2,IRON:2,RETICCTPCT:2 in the last 72 hours  Micro Results: No results found for this or any previous visit (from the past 240 hour(s)).  Studies/Results: No results found.  Medications: I have reviewed the patient's current medications.   Patient Active Hospital Problem List: Gout attack (11/10/2011) Will continue prednisone and colchicine.  After discussion with patient concerning risks and benefits since he has tried NSAIDs (ibuprofen) in the past without any adverse reactions he would like to try indomethacin today to help speed up his recovery given his history of CHF will only plan on adding medication for a brief period of time with caution.  If he breaks out in a rash he is to call nurse and have then page me.  He verbalizes his understanding and agreement.  Leukocytosis: Likely related to recent steroid administration. Given lack of rubor and calor don't feel that antibiotics are warranted at this time. Will continue to monitor his  WBC and Temps.   OBESITY (12/23/2010) Recommend diet   CHF (12/23/2010) Stable at this juncture. Pt is on lasix 40 mg bid. On B blocker and Ace I   DIABETES MELLITUS, TYPE II, BORDERLINE (12/23/2010) Pt's blood sugars have ranged while in house. Given recent steroid administration suspect that patient's blood sugars should drop. Will decrease steroids as indicated above. Continue to monitor blood sugars and will change medication pending results.   Cardiomyopathy (10/22/2011) Stable currently   OSA (obstructive sleep apnea) (10/23/2011) Stable currently. Pt may have to f/u as outpatient.      LOS:  3 days   Penny Pia M.D.  [facility] 11/12/2011, 1:39 PM

## 2011-11-13 LAB — GLUCOSE, CAPILLARY
Glucose-Capillary: 222 mg/dL — ABNORMAL HIGH (ref 70–99)
Glucose-Capillary: 226 mg/dL — ABNORMAL HIGH (ref 70–99)
Glucose-Capillary: 338 mg/dL — ABNORMAL HIGH (ref 70–99)
Glucose-Capillary: 234 mg/dL — ABNORMAL HIGH (ref 70–99)

## 2011-11-13 LAB — CBC
HCT: 39.1 % (ref 39.0–52.0)
HCT: 38.3 % — ABNORMAL LOW (ref 39.0–52.0)
Hemoglobin: 13.1 g/dL (ref 13.0–17.0)
MCHC: 33.5 g/dL (ref 30.0–36.0)
RBC: 4.67 MIL/uL (ref 4.22–5.81)
RBC: 4.74 MIL/uL (ref 4.22–5.81)
RDW: 12.6 % (ref 11.5–15.5)
WBC: 14.8 10*3/uL — ABNORMAL HIGH (ref 4.0–10.5)

## 2011-11-13 MED ORDER — DOCUSATE SODIUM 100 MG PO CAPS
100.0000 mg | ORAL_CAPSULE | Freq: Two times a day (BID) | ORAL | Status: DC
  Administered 2011-11-13 – 2011-11-17 (×7): 100 mg via ORAL
  Filled 2011-11-13 (×11): qty 1

## 2011-11-13 MED ORDER — OXYCODONE-ACETAMINOPHEN 5-325 MG PO TABS
1.0000 | ORAL_TABLET | ORAL | Status: DC | PRN
  Administered 2011-11-13 – 2011-11-14 (×2): 1 via ORAL
  Filled 2011-11-13 (×3): qty 1

## 2011-11-13 MED ORDER — METOPROLOL TARTRATE 50 MG PO TABS
50.0000 mg | ORAL_TABLET | Freq: Two times a day (BID) | ORAL | Status: DC
  Administered 2011-11-13 – 2011-11-17 (×8): 50 mg via ORAL
  Filled 2011-11-13 (×10): qty 1

## 2011-11-13 MED ORDER — PREDNISONE 20 MG PO TABS
40.0000 mg | ORAL_TABLET | Freq: Two times a day (BID) | ORAL | Status: DC
  Administered 2011-11-13 – 2011-11-15 (×4): 40 mg via ORAL
  Filled 2011-11-13 (×8): qty 2

## 2011-11-13 NOTE — Progress Notes (Addendum)
Subjective: Pt reports that he still has significant discomfort at his ankle.  Mentions that the pain is improved but states that he still is having difficulty with ambulation.  Was called by nursing and they indicated that patient had told them that he had noticed some blood on his stools.  Was painless reportedly.  Otherwise no acute issues overnight.  Objective: Filed Vitals:   11/12/11 1059 11/12/11 1400 11/12/11 2123 11/13/11 0610  BP: 110/80 116/75 123/82 129/81  Pulse:  69 58 54  Temp:  97.9 F (36.6 C) 98.1 F (36.7 C) 97.8 F (36.6 C)  TempSrc:      Resp:  20 18 18   Height:      Weight:    136.8 kg (301 lb 9.4 oz)  SpO2:  96% 95% 97%   Weight change:   Intake/Output Summary (Last 24 hours) at 11/13/11 0942 Last data filed at 11/13/11 0600  Gross per 24 hour  Intake   1200 ml  Output   3400 ml  Net  -2200 ml    General: Alert, awake, oriented x3, in no acute distress.  HEENT: No bruits, no goiter.  Heart: Regular rate and rhythm, without murmurs, rubs, gallops.  Lungs: CTA BL Abdomen: Soft, nontender, nondistended, positive bowel sounds.  Neuro: Grossly intact, nonfocal.   Lab Results:  Basename 11/11/11 0545  NA 135  K 4.8  CL 102  CO2 25  GLUCOSE 370*  BUN 18  CREATININE 0.77  CALCIUM 10.4  MG --  PHOS --   No results found for this basename: AST:2,ALT:2,ALKPHOS:2,BILITOT:2,PROT:2,ALBUMIN:2 in the last 72 hours No results found for this basename: LIPASE:2,AMYLASE:2 in the last 72 hours  Basename 11/12/11 0600 11/11/11 0545  WBC 17.5* 17.9*  NEUTROABS -- 14.4*  HGB 12.4* 12.7*  HCT 36.5* 37.2*  MCV 84.3 84.2  PLT 386 329   No results found for this basename: CKTOTAL:3,CKMB:3,CKMBINDEX:3,TROPONINI:3 in the last 72 hours No components found with this basename: POCBNP:3 No results found for this basename: DDIMER:2 in the last 72 hours No results found for this basename: HGBA1C:2 in the last 72 hours No results found for this basename:  CHOL:2,HDL:2,LDLCALC:2,TRIG:2,CHOLHDL:2,LDLDIRECT:2 in the last 72 hours No results found for this basename: TSH,T4TOTAL,FREET3,T3FREE,THYROIDAB in the last 72 hours No results found for this basename: VITAMINB12:2,FOLATE:2,FERRITIN:2,TIBC:2,IRON:2,RETICCTPCT:2 in the last 72 hours  Micro Results: No results found for this or any previous visit (from the past 240 hour(s)).  Studies/Results: No results found.  Medications: I have reviewed the patient's current medications.   Patient Active Hospital Problem List: Gout attack (11/10/2011) Still exacerbated at this juncture.  Will plan on increasing prednisone dose today.  Will continue indomethacin and colchicine.  Will plan on providing handout to patient for recommended diet for people with gout.  Blood in stools:  May be secondary to hemorrhoids although other diagnoses possible.  Given history of painless bleed and constipation in a 36 y/o would suspect hemorrhoids.  Will hemoccult stools and order a cbc to check hemoglobin levels.  Pt is currently hemodynamically stable.   Leukocytosis: Likely related to recent steroid administration. Given lack of rubor and calor don't feel that antibiotics are warranted at this time. Will continue to monitor his WBC and Temps.  Pt remains afebrile.   OBESITY (12/23/2010) Recommend diet   CHF (12/23/2010) Stable at this juncture. Pt is on lasix 40 mg bid. On B blocker and Ace I   DIABETES MELLITUS, TYPE II, BORDERLINE (12/23/2010) Pt's blood sugars have ranged while in house.  Given recent steroid administration suspect that patient's blood sugars should drop. Will decrease steroids as indicated above. Continue to monitor blood sugars and will change medication pending results.   Cardiomyopathy (10/22/2011) Stable currently   OSA (obstructive sleep apnea) (10/23/2011) Stable currently.        LOS: 4 days   Penny Pia M.D.  [facility] 11/13/2011, 9:42 AM  Addendum:  Given patients age  and history of joint pain will also evaluate for gonorhea/chlamydia

## 2011-11-13 NOTE — Progress Notes (Signed)
Utilization review complete 

## 2011-11-14 LAB — CBC
MCV: 81.8 fL (ref 78.0–100.0)
Platelets: 422 10*3/uL — ABNORMAL HIGH (ref 150–400)
RBC: 4.5 MIL/uL (ref 4.22–5.81)
RDW: 12.8 % (ref 11.5–15.5)
WBC: 16.8 10*3/uL — ABNORMAL HIGH (ref 4.0–10.5)

## 2011-11-14 LAB — OCCULT BLOOD X 1 CARD TO LAB, STOOL: Fecal Occult Bld: POSITIVE

## 2011-11-14 LAB — GLUCOSE, CAPILLARY
Glucose-Capillary: 207 mg/dL — ABNORMAL HIGH (ref 70–99)
Glucose-Capillary: 343 mg/dL — ABNORMAL HIGH (ref 70–99)
Glucose-Capillary: 221 mg/dL — ABNORMAL HIGH (ref 70–99)

## 2011-11-14 LAB — DIFFERENTIAL
Basophils Absolute: 0 10*3/uL (ref 0.0–0.1)
Lymphocytes Relative: 17 % (ref 12–46)
Monocytes Absolute: 1.6 10*3/uL — ABNORMAL HIGH (ref 0.1–1.0)
Monocytes Relative: 9 % (ref 3–12)
Neutro Abs: 12.3 10*3/uL — ABNORMAL HIGH (ref 1.7–7.7)
Neutrophils Relative %: 74 % (ref 43–77)

## 2011-11-14 LAB — GC/CHLAMYDIA PROBE AMP, GENITAL
Chlamydia, DNA Probe: NEGATIVE
GC Probe Amp, Genital: NEGATIVE

## 2011-11-14 MED ORDER — OXYCODONE-ACETAMINOPHEN 5-325 MG PO TABS
1.0000 | ORAL_TABLET | ORAL | Status: DC | PRN
  Administered 2011-11-14 – 2011-11-16 (×9): 2 via ORAL
  Filled 2011-11-14 (×10): qty 2

## 2011-11-14 NOTE — Progress Notes (Signed)
Subjective: Pt mentions that he still having difficulty with ambulation.  Pain is improving reportedly even though he mentions that he asked for dilaudid recently.  But mentions pain is tolerable on po medication.  Denies any fever or chills.  Objective: Filed Vitals:   11/13/11 0610 11/13/11 1300 11/13/11 2015 11/14/11 0704  BP: 129/81 124/74 149/94 122/80  Pulse: 54 67 67 58  Temp: 97.8 F (36.6 C) 98.3 F (36.8 C) 97.9 F (36.6 C) 97.9 F (36.6 C)  TempSrc:      Resp: 18 18 18 22   Height:      Weight: 136.8 kg (301 lb 9.4 oz)     SpO2: 97% 99% 98% 98%   Weight change:   Intake/Output Summary (Last 24 hours) at 11/14/11 1028 Last data filed at 11/14/11 0909  Gross per 24 hour  Intake   1243 ml  Output    925 ml  Net    318 ml    General: Alert, awake, oriented x3, in no acute distress.  HEENT: No bruits, no goiter.  Heart: Regular rate and rhythm, without murmurs, rubs, gallops.  Lungs: CTA BL  Abdomen: Soft, nontender, nondistended, positive bowel sounds.  Neuro: Grossly intact, nonfocal. Extremity:  Left ankle tenderness on palpation with pain with active and passive motion.  None errythematous and less edematous than yesterday.   Lab Results: No results found for this basename: NA:2,K:2,CL:2,CO2:2,GLUCOSE:2,BUN:2,CREATININE:2,CALCIUM:2,MG:2,PHOS:2 in the last 72 hours No results found for this basename: AST:2,ALT:2,ALKPHOS:2,BILITOT:2,PROT:2,ALBUMIN:2 in the last 72 hours No results found for this basename: LIPASE:2,AMYLASE:2 in the last 72 hours  Basename 11/14/11 0600 11/13/11 2204  WBC 16.8* 15.6*  NEUTROABS 12.3* --  HGB 13.1 13.8  HCT 36.8* 38.3*  MCV 81.8 80.8  PLT 422* 435*   No results found for this basename: CKTOTAL:3,CKMB:3,CKMBINDEX:3,TROPONINI:3 in the last 72 hours No components found with this basename: POCBNP:3 No results found for this basename: DDIMER:2 in the last 72 hours No results found for this basename: HGBA1C:2 in the last 72  hours No results found for this basename: CHOL:2,HDL:2,LDLCALC:2,TRIG:2,CHOLHDL:2,LDLDIRECT:2 in the last 72 hours No results found for this basename: TSH,T4TOTAL,FREET3,T3FREE,THYROIDAB in the last 72 hours No results found for this basename: VITAMINB12:2,FOLATE:2,FERRITIN:2,TIBC:2,IRON:2,RETICCTPCT:2 in the last 72 hours  Micro Results: No results found for this or any previous visit (from the past 240 hour(s)).  Studies/Results: No results found.  Medications: I have reviewed the patient's current medications.   Patient Active Hospital Problem List: Gout attack (11/10/2011) Manifested as left ankle pain.  Patient had elevated uric acid on initial presentation of 9.9.  Currently is improved clinically.  Will order Physical therapy evaluation today.  D/c dilaudid and place on percocet.  Continue indomethacin and colchicine.  Will place dietitian consult to teach patient what types of foods to avoid due to his recent diagnosis of gout.  GC/Chlamydia test was negative  OBESITY (12/23/2010) Recommended diet modification  CHF (12/23/2010) Grade I diastolic disfunction currently compensated  DIABETES MELLITUS, TYPE II, BORDERLINE (12/23/2010) Have ordered a diabetic care manager consult as patient's blood sugars have been elevated and given recent administration of prednisone suspect that this may be exacerbating current condition.  Cardiomyopathy (10/22/2011) Stable currently  OSA (obstructive sleep apnea) (10/23/2011) Stable patient off cpap at night.  Will need f/u as outpatient.   Leukocytosis (11/11/2011) Pt is afebrile suspect this is secondary to # 1 and prednisone administration.  If spikes fever will consider arthrocentesis.  Negative GC/Chlamydia  BRBPR (bright red blood per rectum) (11/13/2011) Did have  positive fecal occult blood.  Last night was examined and reportedly did not have any hemorrhoids (external).  Patient will need follow up with GI as outpatient.  Have discussed with  patient.  Currently not having any bleeding and mentions that with the stool softeners he has been having easier time with BM's.     LOS: 5 days   Penny Pia M.D.  [facility] 11/14/2011, 10:28 AM

## 2011-11-14 NOTE — Progress Notes (Signed)
Pt. Had one large formed brown stool with streaks of sanguinous liquid with one small clot.  Specimen obtained for Occult Blood.

## 2011-11-14 NOTE — Progress Notes (Signed)
Pt had small amount of  hard stool at approx 2130   on 11/13/11 that had a lot of dark red blood on it. Asymtomatic. Pt had not been straining to have BM. Advised NP Lynch. CBC ordered and she stated she would advise the treatment team / and to monitor the pt.

## 2011-11-14 NOTE — Progress Notes (Signed)
Nutrition dx:  Nutrition-related knowledge deficit r/t no previous education AEB new Dx of gout  Intervention:  Brief education;  Provided.  Goals of nutrition therapy discussed.  Understanding confirmed.   Copy of [place] restricted Diet Therapy given to pt and reviewed   Monitoring:  Knowledge; for questions.  Please consult RD if new questions present.  Pager:  Barbette Reichmann [phone]

## 2011-11-15 ENCOUNTER — Inpatient Hospital Stay (HOSPITAL_COMMUNITY): Payer: Self-pay

## 2011-11-15 LAB — URINALYSIS, ROUTINE W REFLEX MICROSCOPIC
Bilirubin Urine: NEGATIVE
Ketones, ur: NEGATIVE mg/dL
Nitrite: NEGATIVE
pH: 5.5 (ref 5.0–8.0)

## 2011-11-15 LAB — GLUCOSE, CAPILLARY
Glucose-Capillary: 152 mg/dL — ABNORMAL HIGH (ref 70–99)
Glucose-Capillary: 178 mg/dL — ABNORMAL HIGH (ref 70–99)

## 2011-11-15 MED ORDER — CEPHALEXIN 500 MG PO CAPS
500.0000 mg | ORAL_CAPSULE | Freq: Two times a day (BID) | ORAL | Status: DC
  Administered 2011-11-15 – 2011-11-16 (×3): 500 mg via ORAL
  Filled 2011-11-15 (×4): qty 1

## 2011-11-15 NOTE — Progress Notes (Signed)
Subjective: Pt feels better today and started to ambulate.  PT feels that patient would need walker given current discomfort for ambulation.  Objective: Filed Vitals:   11/14/11 0704 11/14/11 1456 11/14/11 2300 11/15/11 0500  BP: 122/80 136/88 129/82 126/81  Pulse: 58 64 69 66  Temp: 97.9 F (36.6 C) 97.7 F (36.5 C) 97.6 F (36.4 C) 97.4 F (36.3 C)  TempSrc:  Axillary    Resp: 22 20 18 16   Height:      Weight:      SpO2: 98% 98% 97% 99%   Weight change:   Intake/Output Summary (Last 24 hours) at 11/15/11 1507 Last data filed at 11/15/11 1045  Gross per 24 hour  Intake    620 ml  Output   1800 ml  Net  -1180 ml    General: Alert, awake, oriented x3, in no acute distress.  HEENT: No bruits, no goiter.  Heart: Regular rate and rhythm, without murmurs, rubs, gallops.  Lungs: CTA BL, no wheezes no murmurs Abdomen: Soft, nontender, nondistended, positive bowel sounds.  Neuro: Grossly intact, nonfocal. Extremity:  Pain on palpation over left ankle particularly over medial melleolus.  No errythema or cellulitis noted.   Lab Results: No results found for this basename: NA:2,K:2,CL:2,CO2:2,GLUCOSE:2,BUN:2,CREATININE:2,CALCIUM:2,MG:2,PHOS:2 in the last 72 hours No results found for this basename: AST:2,ALT:2,ALKPHOS:2,BILITOT:2,PROT:2,ALBUMIN:2 in the last 72 hours No results found for this basename: LIPASE:2,AMYLASE:2 in the last 72 hours  Basename 11/14/11 0600 11/13/11 2204  WBC 16.8* 15.6*  NEUTROABS 12.3* --  HGB 13.1 13.8  HCT 36.8* 38.3*  MCV 81.8 80.8  PLT 422* 435*   No results found for this basename: CKTOTAL:3,CKMB:3,CKMBINDEX:3,TROPONINI:3 in the last 72 hours No components found with this basename: POCBNP:3 No results found for this basename: DDIMER:2 in the last 72 hours No results found for this basename: HGBA1C:2 in the last 72 hours No results found for this basename: CHOL:2,HDL:2,LDLCALC:2,TRIG:2,CHOLHDL:2,LDLDIRECT:2 in the last 72 hours No results  found for this basename: TSH,T4TOTAL,FREET3,T3FREE,THYROIDAB in the last 72 hours No results found for this basename: VITAMINB12:2,FOLATE:2,FERRITIN:2,TIBC:2,IRON:2,RETICCTPCT:2 in the last 72 hours  Micro Results: No results found for this or any previous visit (from the past 240 hour(s)).  Studies/Results: No results found.  Medications: I have reviewed the patient's current medications.   Patient Active Hospital Problem List: Gout attack (11/10/2011) Pt is on indomethacin, prednisone, percocet for pain control, and colchicine and improving on this regimen. - Given Leukocytosis have low index of suspicion for infection nonetheless continues to trend up.  Will discontinue and obtain another x ray of patient's left foot.  CHF (12/23/2010) Stable currently  Leukocytosis (11/11/2011) Likely related to Steroid administration.  Will discontinue steroids, place on keflex despite no obvious source of infection. Will order urinalysis and blood cultures.  Although index of suspicion is low as patient remains afebrile.  OBESITY (12/23/2010) Recommended diet modification   CHF (12/23/2010) Grade I diastolic disfunction currently compensated   DIABETES MELLITUS, TYPE II, BORDERLINE (12/23/2010) Have ordered a diabetic care manager consult as patient's blood sugars have been elevated and given recent administration of prednisone suspect that this may be exacerbating current condition.   Cardiomyopathy (10/22/2011) Stable currently   OSA (obstructive sleep apnea) (10/23/2011) Stable patient off cpap at night. Will need f/u as outpatient.   BRBPR (bright red blood per rectum) (11/13/2011) Did have positive fecal occult blood. Last night was examined and reportedly did not have any hemorrhoids (external). Patient will need follow up with GI as outpatient. Have discussed with patient.  Currently not having any bleeding and mentions that with the stool softeners he has been having easier time with BM's.    LOS: 6 days   Penny Pia M.D.  [facility] 11/15/2011, 3:07 PM

## 2011-11-15 NOTE — Progress Notes (Signed)
   CARE MANAGEMENT NOTE 11/15/2011  Patient:  Jeffrey [name], Jeffrey [name]   Account Number:  [id number]  Date Initiated:  11/11/2011  Documentation initiated by:  Letha Cape  Subjective/Objective Assessment:   dx gout attack  observation     Action/Plan:   PTA pt lived at home, was independent with ADLs   Anticipated DC Date:  11/12/2011   Anticipated DC Plan:  HOME/SELF CARE      DC Planning Services  CM consult      Choice offered to / List presented to:     DME arranged  Levan Hurst      DME agency  [facility]        Status of service:  Completed, signed off Medicare Important Message given?   (If response is "NO", the following Medicare IM given date fields will be blank) Date Medicare IM given:   Date Additional Medicare IM given:    Discharge Disposition:  HOME/SELF CARE  Per UR Regulation:    Comments:  11/15/2011 1255 Contacted [facility] for RW for home for scheduled d/c today. Pt inquired about applying for Medicaid. Instructed pt to contact Dept of Social Services for [employer]. States he knows where DSS is located and plans to apply. Isidoro Donning RN CCM Case Mgmt phone [phone]  PCP- Dala Dock -Dr. Philipp Deputy (next appointment is 12/02/11- 8:00)  Contact- Chriss Driver (mother)- [phone]  11/12/11- 1130- Donn Pierini RN, BSN [phone] Spoke with pt at bedside- per conversation pt states that he has a PCP at [facility], he states that his orange card is current/active and that he gets his medications at [facility] on [address]. Per pharmacy pt is eligible for assistance with meds if needed through the indigent fund. CM to follow.   11/11/11 16;57 Letha Cape RN, BSN [phone] NCM will continue to follow for dc needs.

## 2011-11-15 NOTE — Evaluation (Signed)
Physical Therapy Evaluation Patient Details Name: Jeffrey [name] MRN: [id number] DOB: 07-23-1976 Today's Date: 11/15/2011  Problem List:  Patient Active Problem List  Diagnoses  . HYPERLIPIDEMIA, MILD  . OBESITY  . HYPERTENSION, BENIGN ESSENTIAL  . CHF  . DIABETES MELLITUS, TYPE II, BORDERLINE  . Diabetes mellitus type 2, uncontrolled  . Chest pain  . Cardiomyopathy  . Systolic CHF, chronic  . Obesities, morbid  . OSA (obstructive sleep apnea)  . Gout attack  . Leukocytosis  . BRBPR (bright red blood per rectum)  . Constipation    Past Medical History:  Past Medical History  Diagnosis Date  . Diabetes mellitus   . CHF (congestive heart failure)     EF 20-25%  . Hypertension   . Obesity   . Obstructive sleep apnea    Past Surgical History:  Past Surgical History  Procedure Date  . Appendectomy     PT Assessment/Plan/Recommendation PT Assessment Clinical Impression Statement: Pt is a 36 y.o. male with an episode of gout.  Pt's desire is to d/c home today.  He will need a RW for d/c today.  If pt continues as an inpt, PT intervention will continue to further address ambulation without A.D. PT Recommendation/Assessment: Patient will need skilled PT in the acute care venue PT Problem List: Decreased activity tolerance;Decreased mobility;Pain;Obesity;Decreased knowledge of use of DME Barriers to Discharge: None PT Therapy Diagnosis : Difficulty walking;Acute pain PT Plan PT Frequency: Min 3X/week PT Treatment/Interventions: DME instruction;Gait training;Stair training;Functional mobility training;Therapeutic activities;Patient/family education PT Recommendation Follow Up Recommendations: No PT follow up Equipment Recommended: Rolling walker with 5" wheels PT Goals  Acute Rehab PT Goals PT Goal Formulation: With patient Time For Goal Achievement: 7 days Pt will go Sit to Stand: with modified independence PT Goal: Sit to Stand - Progress: Goal set today Pt will go  Stand to Sit: with modified independence PT Goal: Stand to Sit - Progress: Goal set today Pt will Transfer Bed to Chair/Chair to Bed: with modified independence PT Transfer Goal: Bed to Chair/Chair to Bed - Progress: Goal set today Pt will Ambulate: >150 feet;with modified independence;with least restrictive assistive device PT Goal: Ambulate - Progress: Goal set today Pt will Go Up / Down Stairs: 3-5 stairs;with least restrictive assistive device;with supervision PT Goal: Up/Down Stairs - Progress: Goal set today  PT Evaluation Precautions/Restrictions  Restrictions Weight Bearing Restrictions: No Prior Functioning  Home Living Lives With: Family Receives Help From: Family Type of Home: House Home Layout: One level Home Access: Stairs to enter Entrance Stairs-Rails: None Entrance Stairs-Number of Steps: 4 Home Adaptive Equipment: None Prior Function Level of Independence: Independent with basic ADLs;Independent with homemaking with ambulation;Independent with gait;Independent with transfers Driving: Yes Cognition Cognition Arousal/Alertness: Awake/alert Overall Cognitive Status: Appears within functional limits for tasks assessed Sensation/Coordination   Extremity Assessment   Mobility (including Balance) Bed Mobility Bed Mobility: Yes Supine to Sit: 6: Modified independent (Device/Increase time) Sit to Supine: 6: Modified independent (Device/Increase time) Transfers Transfers: Yes Sit to Stand: 5: Supervision Sit to Stand Details (indicate cue type and reason): verbal cues for hand placement, sequencing Stand to Sit: 5: Supervision Stand to Sit Details: verbal cues for sequencing/safety Stand Pivot Transfers: 5: Supervision Stand Pivot Transfer Details (indicate cue type and reason): verbal cues for sequencing Ambulation/Gait Ambulation/Gait: Yes Ambulation/Gait Assistance: 5: Supervision Ambulation Distance (Feet): 200 Feet Assistive device: Rolling walker Gait  Pattern: Antalgic Stairs: Yes Stairs Assistance: 4: Min assist Stairs Assistance Details (indicate cue type and reason):  verbal cues for technique Stair Management Technique: No rails;Other (comment) (one-person hand-held assist) Number of Stairs: 2  Height of Stairs: 6     Exercise    End of Session PT - End of Session Equipment Utilized During Treatment: Gait belt Activity Tolerance: Patient tolerated treatment well Patient left: in chair;with call [place] in reach Nurse Communication: Mobility status for transfers;Mobility status for ambulation General Behavior During Session: [facility] for tasks performed Cognition: [facility] for tasks performed  Ilda Foil 11/15/2011, 9:56 AM  [name], PT  Office # [phone] Pager [phone]

## 2011-11-16 DIAGNOSIS — M109 Gout, unspecified: Principal | ICD-10-CM

## 2011-11-16 LAB — CBC
HCT: 40.2 % (ref 39.0–52.0)
Hemoglobin: 13.7 g/dL (ref 13.0–17.0)
RBC: 4.81 MIL/uL (ref 4.22–5.81)
WBC: 19.1 10*3/uL — ABNORMAL HIGH (ref 4.0–10.5)

## 2011-11-16 LAB — GLUCOSE, CAPILLARY
Glucose-Capillary: 78 mg/dL (ref 70–99)
Glucose-Capillary: 108 mg/dL — ABNORMAL HIGH (ref 70–99)
Glucose-Capillary: 130 mg/dL — ABNORMAL HIGH (ref 70–99)

## 2011-11-16 MED ORDER — POLYETHYLENE GLYCOL 3350 17 G PO PACK
17.0000 g | PACK | Freq: Every day | ORAL | Status: DC
  Administered 2011-11-17: 17 g via ORAL
  Filled 2011-11-16 (×3): qty 1

## 2011-11-16 NOTE — Consult Note (Signed)
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 Check HIV test Comment- his increased WBC is most likely due steroids that he has received.  Some concern about his persistent pain. Would stop his keflex while awaiting further studies.   Jeffrey [name] is an 36 y.o. male.  HPI: 36 yo M with DM (recently started on insulin), non-ischemic CM (25% EF, 10-2010) admitted 11-10-11 with 2 days of L foot/ankle swelling and pain. WBC on adm 14k. He was unable to bear wt and was therefore admitted. Plain films showed soft tissue swelling. He was started on steroids (solumedrol 60mg  tid then 40mg  bid), colchicine, and analgesics (indomethacin).  Due to persistent leukocytosis, was started on keflex (11-15-11). Today feels like the swelling in his ankle is better.   Past Medical History  Diagnosis Date  . Diabetes mellitus   . CHF (congestive heart failure)     EF 20-25%  . Hypertension   . Obesity   . Obstructive sleep apnea     Past Surgical History  Procedure Date  . Appendectomy   ergies:   Allergies  Allergen Reactions  . Aspirin Rash    Rash, itching    Medications:  Scheduled:   . cephALEXin  500 mg Oral Q12H  . colchicine  0.6 mg Oral BID  . docusate sodium  100 mg Oral BID  . enoxaparin  40 mg Subcutaneous Q24H  . furosemide  40 mg Oral BID  . gemfibrozil  600 mg Oral BID AC  . indomethacin  50 mg Oral TID WC  . insulin aspart  0-20 Units Subcutaneous Q4H  . insulin aspart protamine-insulin aspart  44 Units Subcutaneous BID WC  . lisinopril  20 mg Oral Daily  . metoprolol  50 mg Oral BID  . polyethylene glycol  17 g Oral Daily  . sodium chloride  3 mL Intravenous Q12H    Social History:  reports that he has quit smoking. He does not have any smokeless tobacco  history on file. He reports that he does not drink alcohol or use illicit drugs.  History reviewed. No pertinent family history.  ROS- nl vision, (no ophtho in last year), nl urination, constipation (on laxative now with frequent BM), no paresthesias, see HPI  Blood pressure 122/70, pulse 61, temperature 98.3 F (36.8 C), temperature source Oral, resp. rate 16, height 5\' 7"  (1.702 m), weight 136.5 kg (300 lb 14.9 oz), SpO2 98.00%. General appearance: alert and no distress Eyes: negative findings: pupils equal, round, reactive to light and accomodation Throat: lips, mucosa, and tongue normal; teeth and gums normal Neck: no adenopathy Lungs: clear to auscultation bilaterally Heart: regular rate and rhythm Abdomen: normal findings: bowel sounds normal and soft, non-tender Extremities: no diabetic foot ulcers. His L ankle is mildly swollen, tender, mild increase in warmth. no effusion.    Results for orders placed during the hospital encounter of 11/09/11 (from the past 48 hour(s))  GLUCOSE, CAPILLARY     Status: Abnormal   Collection Time   11/14/11  4:17 PM      Component Value Range Comment   Glucose-Capillary 343 (*) 70 - 99 (mg/dL)    Comment 1 Documented in Chart      Comment 2 Notify RN     GLUCOSE, CAPILLARY     Status: Abnormal  Collection Time   11/14/11  8:34 PM      Component Value Range Comment   Glucose-Capillary 221 (*) 70 - 99 (mg/dL)   GLUCOSE, CAPILLARY     Status: Abnormal   Collection Time   11/14/11 11:33 PM      Component Value Range Comment   Glucose-Capillary 152 (*) 70 - 99 (mg/dL)   GLUCOSE, CAPILLARY     Status: Abnormal   Collection Time   11/15/11  4:37 AM      Component Value Range Comment   Glucose-Capillary 240 (*) 70 - 99 (mg/dL)   GLUCOSE, CAPILLARY     Status: Abnormal   Collection Time   11/15/11  8:39 AM      Component Value Range Comment   Glucose-Capillary 250 (*) 70 - 99 (mg/dL)   GLUCOSE, CAPILLARY     Status: Abnormal   Collection Time    11/15/11 11:14 AM      Component Value Range Comment   Glucose-Capillary 178 (*) 70 - 99 (mg/dL)   CULTURE, BLOOD (ROUTINE X 2)     Status: Normal (Preliminary result)   Collection Time   11/15/11  3:31 PM      Component Value Range Comment   Specimen Description BLOOD LEFT ARM      Special Requests BOTTLES DRAWN AEROBIC AND ANAEROBIC 5CC      Culture  Setup Time [id number]      Culture        Value:        BLOOD CULTURE RECEIVED NO GROWTH TO DATE CULTURE WILL BE HELD FOR 5 DAYS BEFORE ISSUING A FINAL NEGATIVE REPORT   Report Status PENDING     CULTURE, BLOOD (ROUTINE X 2)     Status: Normal (Preliminary result)   Collection Time   11/15/11  3:35 PM      Component Value Range Comment   Specimen Description BLOOD RIGHT ARM      Special Requests        Value: BOTTLES DRAWN AEROBIC AND ANAEROBIC 6CC AER 4CC ANA   Culture  Setup Time [id number]      Culture        Value:        BLOOD CULTURE RECEIVED NO GROWTH TO DATE CULTURE WILL BE HELD FOR 5 DAYS BEFORE ISSUING A FINAL NEGATIVE REPORT   Report Status PENDING     GLUCOSE, CAPILLARY     Status: Abnormal   Collection Time   11/15/11  4:53 PM      Component Value Range Comment   Glucose-Capillary 251 (*) 70 - 99 (mg/dL)   URINALYSIS, ROUTINE W REFLEX MICROSCOPIC     Status: Normal   Collection Time   11/15/11  5:26 PM      Component Value Range Comment   Color, Urine YELLOW  YELLOW     APPearance CLEAR  CLEAR     Specific Gravity, Urine 1.016  1.005 - 1.030     pH 5.5  5.0 - 8.0     Glucose, UA NEGATIVE  NEGATIVE (mg/dL)    Hgb urine dipstick NEGATIVE  NEGATIVE     Bilirubin Urine NEGATIVE  NEGATIVE     Ketones, ur NEGATIVE  NEGATIVE (mg/dL)    Protein, ur NEGATIVE  NEGATIVE (mg/dL)    Urobilinogen, UA 0.2  0.0 - 1.0 (mg/dL)    Nitrite NEGATIVE  NEGATIVE     Leukocytes, UA NEGATIVE  NEGATIVE  MICROSCOPIC NOT DONE ON URINES WITH NEGATIVE PROTEIN, BLOOD,  LEUKOCYTES, NITRITE, OR GLUCOSE <1000 mg/dL.  GLUCOSE, CAPILLARY     Status:  Abnormal   Collection Time   11/15/11  8:02 PM      Component Value Range Comment   Glucose-Capillary 345 (*) 70 - 99 (mg/dL)   GLUCOSE, CAPILLARY     Status: Abnormal   Collection Time   11/16/11  2:00 AM      Component Value Range Comment   Glucose-Capillary 162 (*) 70 - 99 (mg/dL)   GLUCOSE, CAPILLARY     Status: Normal   Collection Time   11/16/11  6:23 AM      Component Value Range Comment   Glucose-Capillary 78  70 - 99 (mg/dL)   GLUCOSE, CAPILLARY     Status: Abnormal   Collection Time   11/16/11  7:58 AM      Component Value Range Comment   Glucose-Capillary 161 (*) 70 - 99 (mg/dL)    Comment 1 Documented in Chart      Comment 2 Notify RN     CBC     Status: Abnormal   Collection Time   11/16/11  8:43 AM      Component Value Range Comment   WBC 19.1 (*) 4.0 - 10.5 (K/uL)    RBC 4.81  4.22 - 5.81 (MIL/uL)    Hemoglobin 13.7  13.0 - 17.0 (g/dL)    HCT 96.0  45.4 - 09.8 (%)    MCV 83.6  78.0 - 100.0 (fL)    MCH 28.5  26.0 - 34.0 (pg)    MCHC 34.1  30.0 - 36.0 (g/dL)    RDW 11.9  14.7 - 82.9 (%)    Platelets 438 (*) 150 - 400 (K/uL)   GLUCOSE, CAPILLARY     Status: Abnormal   Collection Time   11/16/11 12:03 PM      Component Value Range Comment   Glucose-Capillary 108 (*) 70 - 99 (mg/dL)    Comment 1 Documented in Chart      Comment 2 Notify RN     GLUCOSE, CAPILLARY     Status: Abnormal   Collection Time   11/16/11  3:40 PM      Component Value Range Comment   Glucose-Capillary 177 (*) 70 - 99 (mg/dL)    Comment 1 Documented in Chart      Comment 2 Notify RN         Component Value Date/Time   SDES BLOOD RIGHT ARM 11/15/2011 1535   SPECREQUEST BOTTLES DRAWN AEROBIC AND ANAEROBIC 6CC AER 4CC ANA 11/15/2011 1535   CULT        BLOOD CULTURE RECEIVED NO GROWTH TO DATE CULTURE WILL BE HELD FOR 5 DAYS BEFORE ISSUING A FINAL NEGATIVE REPORT 11/15/2011 1535   REPTSTATUS PENDING 11/15/2011 1535   Dg Ankle 2 Views Left  11/15/2011  *RADIOLOGY REPORT*  Clinical Data: Left  ankle pain  LEFT ANKLE - 2 VIEW  Comparison: 11/09/2011  Findings: Two views of the left ankle submitted.  No acute fracture or subluxation.  Ankle mortise is preserved.  IMPRESSION: No acute fracture or subluxation.  Original Report Authenticated By: Natasha Mead, M.D.    Thank you so much for this interesting consult,   Johny Sax [phone] 11/16/2011, 3:52 PM

## 2011-11-16 NOTE — Progress Notes (Addendum)
Inpatient Diabetes Program Recommendations  AACE/ADA: New Consensus Statement on Inpatient Glycemic Control (2009)  Target Ranges:  Prepandial:   less than 140 mg/dL      Peak postprandial:   less than 180 mg/dL (1-2 hours)      Critically ill patients:  140 - 180 mg/dL   Reason for Visit: Consult for recommendations regarding diabetes regimen  Inpatient Diabetes Program Recommendations Insulin - Basal: Documentation indicates 70/30 44 units was given at breakfast, supper, and also at 2155.   Correction (SSI): No longer on steroids. Request that CBG schedule be changed to tid & HS with no HS coverage instead of q 4 hours.  HgbA1C: --  Note: Unclear whether patient's CBG's have improved since steroids have been stopped or due to extra dose of 70/30 44 units at bedtime.  Patient was diagnosed with diabetes several months ago.  States that CBG's at home have not been under 200 mg/dl since diagnosis.  Will re-evaluate CBG's tomorrow to reassess need to increase 70/30.  Suggest that Metformin be considered in this patient starting with 500 mg daily, increasing as an outpatient to BID, and subsequently increasing dosage until maximum dose is tolerated.  To follow-up at [facility] on the 27th.  Thank you.          Discussed signs/symptoms and appropriate treatment of hypoglycemia.

## 2011-11-16 NOTE — Progress Notes (Signed)
Interim history Pt is a 36 y/o AAM with a new history of Diabetes, nonischemic cardiomyopathy with ejection fraction of 20-25%, HTN, obesity, and sleep apnea that presented to the ED complaining of acute Left ankle discomfort.  Initial work-up showed a WBC of 14, normal renal functions, with negative ankle x rays.  Because he was not able to ambulate it was decided to admit the patient.  On further investigation on the floor patient had negative GC/chlamydia elevated Uric acid and was treated for presumed gout.  He received prednisone, colchicine, indomethacin.  His swelling and pain decreased on this regimen but his Leukocytosis has got worse.  Yesterday 11/14/10 blood cultures were ordered, Urinalysis was repeated and was negative, and x rays of the ankle ordered again.  I discontinued his prednisone and started the patient on Keflex although no obvious source of infection was noticeable.   Subjective: Pt mentions that the pain and the swelling in his ankle is improved.  Denies any dysuria, SOB, cough, fever, or chills.  No current complaints at this time other than the pain at his ankle.      Objective: Filed Vitals:   11/15/11 2115 11/15/11 2244 11/16/11 0620 11/16/11 0700  BP: 131/74 131/74 111/64   Pulse: 64 64 60   Temp: 97.2 F (36.2 C)  98 F (36.7 C)   TempSrc:      Resp: 16  16   Height:      Weight:    136.5 kg (300 lb 14.9 oz)  SpO2: 98%  100%    Weight change:   Intake/Output Summary (Last 24 hours) at 11/16/11 1315 Last data filed at 11/16/11 1229  Gross per 24 hour  Intake    720 ml  Output   3500 ml  Net  -2780 ml    General: Alert, awake, oriented x3, in no acute distress.  HEENT: No bruits, no goiter.  Heart: Regular rate and rhythm, without murmurs, rubs, gallops.  Lungs: CTA BL, no wheezes Abdomen: Soft, nontender, nondistended, positive bowel sounds.  Neuro: Grossly intact, nonfocal. Extremities:  Left ankle discomfort with palpation over medial malleolus.   Non erythematous    Lab Results: No results found for this basename: NA:2,K:2,CL:2,CO2:2,GLUCOSE:2,BUN:2,CREATININE:2,CALCIUM:2,MG:2,PHOS:2 in the last 72 hours No results found for this basename: AST:2,ALT:2,ALKPHOS:2,BILITOT:2,PROT:2,ALBUMIN:2 in the last 72 hours No results found for this basename: LIPASE:2,AMYLASE:2 in the last 72 hours  Basename 11/16/11 0843 11/14/11 0600  WBC 19.1* 16.8*  NEUTROABS -- 12.3*  HGB 13.7 13.1  HCT 40.2 36.8*  MCV 83.6 81.8  PLT 438* 422*   No results found for this basename: CKTOTAL:3,CKMB:3,CKMBINDEX:3,TROPONINI:3 in the last 72 hours No components found with this basename: POCBNP:3 No results found for this basename: DDIMER:2 in the last 72 hours No results found for this basename: HGBA1C:2 in the last 72 hours No results found for this basename: CHOL:2,HDL:2,LDLCALC:2,TRIG:2,CHOLHDL:2,LDLDIRECT:2 in the last 72 hours No results found for this basename: TSH,T4TOTAL,FREET3,T3FREE,THYROIDAB in the last 72 hours No results found for this basename: VITAMINB12:2,FOLATE:2,FERRITIN:2,TIBC:2,IRON:2,RETICCTPCT:2 in the last 72 hours  Micro Results: Recent Results (from the past 240 hour(s))  CULTURE, BLOOD (ROUTINE X 2)     Status: Normal (Preliminary result)   Collection Time   11/15/11  3:31 PM      Component Value Range Status Comment   Specimen Description BLOOD LEFT ARM   Final    Special Requests BOTTLES DRAWN AEROBIC AND ANAEROBIC 5CC   Final    Culture  Setup Time [id number]   Final  Culture     Final    Value:        BLOOD CULTURE RECEIVED NO GROWTH TO DATE CULTURE WILL BE HELD FOR 5 DAYS BEFORE ISSUING A FINAL NEGATIVE REPORT   Report Status PENDING   Incomplete   CULTURE, BLOOD (ROUTINE X 2)     Status: Normal (Preliminary result)   Collection Time   11/15/11  3:35 PM      Component Value Range Status Comment   Specimen Description BLOOD RIGHT ARM   Final    Special Requests     Final    Value: BOTTLES DRAWN AEROBIC AND ANAEROBIC  6CC AER 4CC ANA   Culture  Setup Time [id number]   Final    Culture     Final    Value:        BLOOD CULTURE RECEIVED NO GROWTH TO DATE CULTURE WILL BE HELD FOR 5 DAYS BEFORE ISSUING A FINAL NEGATIVE REPORT   Report Status PENDING   Incomplete     Studies/Results: Dg Ankle 2 Views Left  11/15/2011  *RADIOLOGY REPORT*  Clinical Data: Left ankle pain  LEFT ANKLE - 2 VIEW  Comparison: 11/09/2011  Findings: Two views of the left ankle submitted.  No acute fracture or subluxation.  Ankle mortise is preserved.  IMPRESSION: No acute fracture or subluxation.  Original Report Authenticated By: Natasha Mead, M.D.    Medications: I have reviewed the patient's current medications.   Patient Active Hospital Problem List: Gout attack (11/10/2011) At this point patient is improving on colchicine and indomethacin.  I have discontinued the prednisone yesterday.  Clinically patient is improving but has had persistant leukocytosis.  PT has indicated patient will require walker for ambulation due to discomfort.  Repeat x rays of ankle were negative.  CHF (12/23/2010) Stable currently.  Patient is fluid restricted.  Leukocytosis (11/11/2011) Etiology?  At this point no obvious source other than the left ankle.  Patient's blood cultures pending.  Thought this may be secondary to prednisone.  Discontinued prednisone yesterday but WBC is still trending up.  Placed patient on Keflex thinking this is a bactericidal agent and may cover for soft tissue infection although ankle does not appear erythematous. Am considering arthrocentesis at this juncture but will defer further work-up to ID.    Will f/u with ID's recommendations.  DIABETES MELLITUS, TYPE II, BORDERLINE (12/23/2010)  Blood sugars have been better controlled currently.  Will continue to monitor and continue current regimen.  Cardiomyopathy (10/22/2011) Stable currently  OSA (obstructive sleep apnea) (10/23/2011) Stable  BRBPR (bright red blood per  rectum) (11/13/2011) Resolved, continue colace and will add miralax.    Constipation (11/14/2011) Likely due to side effects of opiods.  Patient is on colace.  Will add miralax today.     LOS: 7 days   Penny Pia M.D.  [facility] 11/16/2011, 1:15 PM '

## 2011-11-16 NOTE — Progress Notes (Signed)
Physical Therapy Treatment Patient Details Name: Jeffrey [name] MRN: [id number] DOB: 02/25/1976 Today's Date: 11/16/2011  PT Assessment/Plan  PT - Assessment/Plan Comments on Treatment Session: Pt admitted s/p left LE gout attack causing increased pain and decreased activity tolerance.  Pt able to ambulate today with PT although still limited by pain.  Will continue to work toward increased independence and decreased use of RW with gait. PT Plan: Discharge plan remains appropriate;Frequency remains appropriate PT Frequency: Min 3X/week Follow Up Recommendations: No PT follow up Equipment Recommended: Rolling walker with 5" wheels PT Goals  Acute Rehab PT Goals PT Goal Formulation: With patient Time For Goal Achievement: 7 days PT Goal: Sit to Stand - Progress: Met PT Goal: Stand to Sit - Progress: Met PT Goal: Ambulate - Progress: Progressing toward goal PT Goal: Up/Down Stairs - Progress: Progressing toward goal  PT Treatment Precautions/Restrictions  Precautions Required Braces or Orthoses: No Restrictions Weight Bearing Restrictions: No Pain 6/10 in left ankle.  Pt premedicated and repositioned after treatment. Mobility (including Balance) Bed Mobility Bed Mobility: Yes Supine to Sit: 7: Independent Sit to Supine: 7: Independent Transfers Transfers: Yes Sit to Stand: 6: Modified independent (Device/Increase time) Stand to Sit: 6: Modified independent (Device/Increase time) Stand Pivot Transfers: Not tested (comment) Ambulation/Gait Ambulation/Gait: Yes Ambulation/Gait Assistance: 5: Supervision Ambulation/Gait Assistance Details (indicate cue type and reason): Verbal cues for safe use of RW and tall posture inside RW.  Unable to attempt ambulation without RW today due to pain. Ambulation Distance (Feet): 220 Feet Assistive device: Rolling walker Gait Pattern: Step-through pattern;Decreased stance time - left;Decreased stride length;Trunk flexed;Antalgic Stairs:  Yes Stairs Assistance: 4: Min assist (Min (guard)) Stairs Assistance Details (indicate cue type and reason): Guarding for balance with cues for technique using "up with good, down with bad." Stair Management Technique: Two rails;Step to pattern;Forwards Number of Stairs: 2  Height of Stairs: 8  (inches) Wheelchair Mobility Wheelchair Mobility: No  Posture/Postural Control Posture/Postural Control: No significant limitations Balance Balance Assessed: No End of Session PT - End of Session Activity Tolerance: Patient tolerated treatment well Patient left: in bed;with call Hawes in reach Nurse Communication: Mobility status for transfers;Mobility status for ambulation General Behavior During Session: [facility] for tasks performed Cognition: [facility] for tasks performed  Cephus Shelling 11/16/2011, 9:58 AM  11/16/2011 Cephus Shelling, PT, DPT [phone]

## 2011-11-17 ENCOUNTER — Inpatient Hospital Stay (HOSPITAL_COMMUNITY): Payer: Self-pay

## 2011-11-17 LAB — GLUCOSE, CAPILLARY
Glucose-Capillary: 183 mg/dL — ABNORMAL HIGH (ref 70–99)
Glucose-Capillary: 132 mg/dL — ABNORMAL HIGH (ref 70–99)

## 2011-11-17 LAB — CBC
HCT: 41 % (ref 39.0–52.0)
MCH: 28.7 pg (ref 26.0–34.0)
MCHC: 34.6 g/dL (ref 30.0–36.0)
MCV: 83 fL (ref 78.0–100.0)
Platelets: 449 10*3/uL — ABNORMAL HIGH (ref 150–400)
RDW: 13.6 % (ref 11.5–15.5)
WBC: 17.6 10*3/uL — ABNORMAL HIGH (ref 4.0–10.5)

## 2011-11-17 MED ORDER — INDOMETHACIN 50 MG PO CAPS: 50.0000 mg | ORAL_CAPSULE | Freq: Three times a day (TID) | ORAL | Status: AC

## 2011-11-17 MED ORDER — DSS 100 MG PO CAPS: 100.0000 mg | ORAL_CAPSULE | Freq: Two times a day (BID) | ORAL | Status: AC

## 2011-11-17 MED ORDER — GADOBENATE DIMEGLUMINE 529 MG/ML IV SOLN
20.0000 mL | Freq: Once | INTRAVENOUS | Status: AC
  Administered 2011-11-17: 20 mL via INTRAVENOUS

## 2011-11-17 MED ORDER — COLCHICINE 0.6 MG PO TABS: 0.6000 mg | ORAL_TABLET | Freq: Every day | ORAL | Status: DC

## 2011-11-17 MED ORDER — OXYCODONE-ACETAMINOPHEN 5-325 MG PO TABS: 1.0000 | ORAL_TABLET | ORAL | Status: AC | PRN

## 2011-11-17 NOTE — Progress Notes (Signed)
INFECTIOUS DISEASE PROGRESS NOTE  ID: Jeffrey [name] is a 36 y.o. male with   Principal Problem:  *Gout attack Active Problems:  OBESITY  CHF  DIABETES MELLITUS, TYPE II, BORDERLINE  Cardiomyopathy  OSA (obstructive sleep apnea)  Leukocytosis  BRBPR (bright red blood per rectum)  Constipation  Subjective: Feels better  Abtx:  Anti-infectives     Start     Dose/Rate Route Frequency Ordered Stop   11/15/11 1515   cephALEXin (KEFLEX) capsule 500 mg  Status:  Discontinued        500 mg Oral Every 12 hours 11/15/11 1506 11/16/11 1632          Medications:  Scheduled:   . colchicine  0.6 mg Oral BID  . docusate sodium  100 mg Oral BID  . enoxaparin  40 mg Subcutaneous Q24H  . furosemide  40 mg Oral BID  . gadobenate dimeglumine  20 mL Intravenous Once  . gemfibrozil  600 mg Oral BID AC  . indomethacin  50 mg Oral TID WC  . insulin aspart  0-20 Units Subcutaneous Q4H  . insulin aspart protamine-insulin aspart  44 Units Subcutaneous BID WC  . lisinopril  20 mg Oral Daily  . metoprolol  50 mg Oral BID  . polyethylene glycol  17 g Oral Daily  . sodium chloride  3 mL Intravenous Q12H  . DISCONTD: cephALEXin  500 mg Oral Q12H    Objective: Vital signs in last 24 hours: Temp:  [97.5 F (36.4 C)-98.3 F (36.8 C)] 97.9 F (36.6 C) (02/12 0605) Pulse Rate:  [59-62] 62  (02/12 0605) Resp:  [16] 16  (02/12 0605) BP: (106-134)/(51-75) 106/51 mmHg (02/12 0605) SpO2:  [98 %-99 %] 98 % (02/12 0605) Weight:  [136.4 kg (300 lb 11.3 oz)] 136.4 kg (300 lb 11.3 oz) (02/12 0605)   General appearance: alert, cooperative and no distress Extremities: L ankle- minimal tenderness, no swelling, no increase in heat.   Lab Results  Basename 11/17/11 0850 11/16/11 0843  WBC 17.6* 19.1*  HGB 14.2 13.7  HCT 41.0 40.2  NA -- --  K -- --  CL -- --  CO2 -- --  BUN -- --  CREATININE -- --  GLU -- --   Liver Panel No results found for this basename:  PROT:2,ALBUMIN:2,AST:2,ALT:2,ALKPHOS:2,BILITOT:2,BILIDIR:2,IBILI:2 in the last 72 hours Sedimentation Rate No results found for this basename: ESRSEDRATE in the last 72 hours C-Reactive Protein No results found for this basename: CRP:2 in the last 72 hours  Microbiology: Recent Results (from the past 240 hour(s))  CULTURE, BLOOD (ROUTINE X 2)     Status: Normal (Preliminary result)   Collection Time   11/15/11  3:31 PM      Component Value Range Status Comment   Specimen Description BLOOD LEFT ARM   Final    Special Requests BOTTLES DRAWN AEROBIC AND ANAEROBIC 5CC   Final    Culture  Setup Time [id number]   Final    Culture     Final    Value:        BLOOD CULTURE RECEIVED NO GROWTH TO DATE CULTURE WILL BE HELD FOR 5 DAYS BEFORE ISSUING A FINAL NEGATIVE REPORT   Report Status PENDING   Incomplete   CULTURE, BLOOD (ROUTINE X 2)     Status: Normal (Preliminary result)   Collection Time   11/15/11  3:35 PM      Component Value Range Status Comment   Specimen Description BLOOD RIGHT ARM   Final  Special Requests     Final    Value: BOTTLES DRAWN AEROBIC AND ANAEROBIC 6CC AER 4CC ANA   Culture  Setup Time [id number]   Final    Culture     Final    Value:        BLOOD CULTURE RECEIVED NO GROWTH TO DATE CULTURE WILL BE HELD FOR 5 DAYS BEFORE ISSUING A FINAL NEGATIVE REPORT   Report Status PENDING   Incomplete     Studies/Results: Dg Ankle 2 Views Left  11/15/2011  *RADIOLOGY REPORT*  Clinical Data: Left ankle pain  LEFT ANKLE - 2 VIEW  Comparison: 11/09/2011  Findings: Two views of the left ankle submitted.  No acute fracture or subluxation.  Ankle mortise is preserved.  IMPRESSION: No acute fracture or subluxation.  Original Report Authenticated By: Natasha Mead, M.D.   Mr Ankle Left W Wo Contrast  11/17/2011  *RADIOLOGY REPORT*  Clinical Data: Left ankle pain and swelling. Gout.  MRI OF THE LEFT ANKLE WITHOUT AND WITH CONTRAST  Technique:  Multiplanar, multisequence MR imaging was  performed both before and after administration of intravenous contrast.  Contrast: 20mL MULTIHANCE GADOBENATE DIMEGLUMINE 529 MG/ML IV SOLN  Comparison: Radiographs dated 02/10 and 11/09/2011  Findings: The scan demonstrates the patient has edema and abnormal enhancement of the synovium in the lateral aspect of the ankle joint and in the sinus tarsi.  No significant arthritic changes.  The tendons and ligaments of the medial and lateral aspects of the ankle are normal.  Achilles tendon is normal.  Plantar fascia is normal.  IMPRESSION: Synovitis of the lateral aspect of the ankle joint and in the sinus tarsi.  No other significant abnormalities.  I suspect this is due to the patient's gout.  Original Report Authenticated By: Gwynn Burly, M.D.     Assessment/Plan: Leukocytosis Gout MRI does not show deep space infection. HIV (-)  Consider leukocytosis due to steroids. No further anbx.  Repeat WBC count at f/u with PMD.  Available if ?s  Johny Sax Infectious Diseases [phone] 11/17/2011, 10:18 AM

## 2011-11-17 NOTE — Progress Notes (Signed)
PT Cancel Note:  Pt deferring PT session this afternoon due to waiting to d/c home.    Verdell Face, [place] [phone] 11/17/2011

## 2011-11-17 NOTE — Progress Notes (Signed)
Inpatient Diabetes Program Recommendations  AACE/ADA: New Consensus Statement on Inpatient Glycemic Control (2009)  Target Ranges:  Prepandial:   less than 140 mg/dL      Peak postprandial:   less than 180 mg/dL (1-2 hours)      Critically ill patients:  140 - 180 mg/dL   Reason for Visit: Follow-up from yesterday's assessment/consult (See note dated 11/16/2011)  CBG's indicate adequate control.  Did receive 4 units of Novolog around midnight since CBG's and correction are q 4 hours, so before breakfast CBG would be lower because of this.  Current dose of 70/30 44 units BID seems to be adequate at present.  Still recommend that CBG schedule abe changed to qid- tid with correction and HS without correction.  Still recommend consideration of addition of Metformin with titration upward as an outpatient. (Will take about 8 weeks to see impact of Metformin on glycemic control and hopefully 70/30 can be reduced some.)  Note:  Results for DEMITRUS, FRANCISCO (MRN [id number]) as of 11/17/2011 10:48  Ref. Range 11/16/2011 15:40 11/16/2011 20:21 11/16/2011 23:42 11/17/2011 03:56 11/17/2011 08:53  Glucose-Capillary Latest Range: 70-99 mg/dL 119 (H) 147 (H) 829 (H) 99 184 (H)

## 2011-11-17 NOTE — Discharge Summary (Signed)
Admit date: 11/09/2011 Discharge date: 11/17/2011  Primary Care Physician:  Eustace Moore, MD, MD   Discharge Diagnoses:   No resolved problems to display.  Active Hospital Problems  Diagnoses Date Noted   . Gout attack 11/10/2011     Priority: High  . Leukocytosis 11/11/2011     Priority: Medium  . CHF 12/23/2010     Priority: Medium  . Constipation 11/14/2011   . BRBPR (bright red blood per rectum) 11/13/2011   . OSA (obstructive sleep apnea) 10/23/2011   . Cardiomyopathy 10/22/2011   . OBESITY 12/23/2010   . DIABETES MELLITUS, TYPE II, BORDERLINE 12/23/2010     Resolved Hospital Problems  Diagnoses Date Noted Date Resolved     DISCHARGE MEDICATION: Medication List  As of 11/17/2011  1:42 PM   STOP taking these medications         glycopyrrolate 1 MG tablet         TAKE these medications         colchicine 0.6 MG tablet   Take 1 tablet (0.6 mg total) by mouth daily.      DSS 100 MG Caps   Take 100 mg by mouth 2 (two) times daily.      furosemide 40 MG tablet   Commonly known as: LASIX   Take 40 mg by mouth 2 (two) times daily.      gemfibrozil 600 MG tablet   Commonly known as: LOPID   Take 600 mg by mouth 2 (two) times daily before a meal.      indomethacin 50 MG capsule   Commonly known as: INDOCIN   Take 1 capsule (50 mg total) by mouth 3 (three) times daily with meals.      insulin NPH-insulin regular (70-30) 100 UNIT/ML injection   Commonly known as: NOVOLIN 70/30   Inject 34 Units into the skin 2 (two) times daily with a meal.      lisinopril 20 MG tablet   Commonly known as: PRINIVIL,ZESTRIL   Take 20 mg by mouth daily.      metformin 500 MG (OSM) 24 hr tablet   Commonly known as: FORTAMET   Take 500 mg by mouth 2 (two) times daily.      metoprolol 50 MG tablet   Commonly known as: LOPRESSOR   Take 50 mg by mouth 2 (two) times daily.      oxyCODONE-acetaminophen 5-325 MG per tablet   Commonly known as: PERCOCET   Take 1-2 tablets by  mouth every 4 (four) hours as needed.              Consults:     SIGNIFICANT DIAGNOSTIC STUDIES:  Dg Ankle 2 Views Left  12/09/11  *RADIOLOGY REPORT*  Clinical Data: Left ankle pain  LEFT ANKLE - 2 VIEW  Comparison: 11/09/2011  Findings: Two views of the left ankle submitted.  No acute fracture or subluxation.  Ankle mortise is preserved.  IMPRESSION: No acute fracture or subluxation.  Original Report Authenticated By: Natasha Mead, M.D.   Dg Ankle Complete Left  11/09/2011  *RADIOLOGY REPORT*  Clinical Data: Left ankle and foot pain.  LEFT ANKLE COMPLETE - 3+ VIEW  Comparison: Contemporaneous foot  Findings: Soft tissue swelling overlies the lateral malleolus.  No acute fracture or dislocation identified.  Ankle mortise remains intact.  No aggressive osseous lesion.  IMPRESSION: Soft tissue swelling overlies lateral malleolus.  No acute osseous abnormality identified.  Original Report Authenticated By: Waneta Martins, M.D.   Ct Head  W Wo Contrast  10/24/2011  *RADIOLOGY REPORT*  Clinical Data:  Migraine headache for 1 week.  History of hypertension and diabetes.  History of morbid obesity.  CT HEAD WITHOUT AND WITH CONTRAST  Technique:  Contiguous axial images were obtained from the base of the skull through the vertex without and with intravenous contrast  Contrast: 80mL OMNIPAQUE IOHEXOL 300 MG/ML IV SOLN  Comparison:  None.  Findings:  The brain has a normal appearance without evidence for hemorrhage, acute infarction, hydrocephalus or mass lesion.  There is no extra axial fluid collection.  The skull and paranasal sinuses are normal.  Following contrast infusion there is a normal enhancement pattern and no mass lesion is identified.  IMPRESSION: Normal CT of the head without and with contrast.  Original Report Authenticated By: Elsie Stain, M.D.   Mr Ankle Left W Wo Contrast  11/17/2011  *RADIOLOGY REPORT*  Clinical Data: Left ankle pain and swelling. Gout.  MRI OF THE LEFT ANKLE  WITHOUT AND WITH CONTRAST  Technique:  Multiplanar, multisequence MR imaging was performed both before and after administration of intravenous contrast.  Contrast: 20mL MULTIHANCE GADOBENATE DIMEGLUMINE 529 MG/ML IV SOLN  Comparison: Radiographs dated 02/10 and 11/09/2011  Findings: The scan demonstrates the patient has edema and abnormal enhancement of the synovium in the lateral aspect of the ankle joint and in the sinus tarsi.  No significant arthritic changes.  The tendons and ligaments of the medial and lateral aspects of the ankle are normal.  Achilles tendon is normal.  Plantar fascia is normal.  IMPRESSION: Synovitis of the lateral aspect of the ankle joint and in the sinus tarsi.  No other significant abnormalities.  I suspect this is due to the patient's gout.  Original Report Authenticated By: Gwynn Burly, M.D.   Nm Myocar Multi W/spect W/wall Motion / Ef  10/24/2011  **ADDENDUM** CREATED: 10/24/2011 13:52:19  Correction to the technique:  30 mCi of technetium 8m Myoview was injected for the rest images and 33 mCi for the stress images.  **END ADDENDUM** SIGNED BY: Richarda Overlie, M.D.    10/24/2011  *RADIOLOGY REPORT*  Clinical Data:  Chest pain.  MYOCARDIAL IMAGING WITH SPECT (REST AND PHARMACOLOGIC-STRESS - 2 DAY PROTOCOL) GATED LEFT VENTRICULAR WALL MOTION STUDY LEFT VENTRICULAR EJECTION FRACTION  Technique:  Standard myocardial SPECT imaging was performed after intravenous injection of 25 mCi Tc-15m tetrofosmin at rest.  On a different day, intravenous infusion of  regadenoson was performed under supervision of the Cardiology staff.  At peak effect of the drug, 25 mCi Tc-82m tetrofosmin was injected intravenously and standard myocardial SPECT imaging was performed.  Quantitative gated imaging was also performed to evaluate left ventricular wall motion and estimate left ventricular ejection fraction.  Comparison:  None.  Findings: Normal myocardial perfusion on the stress images.  There is no  evidence for a fixed or reversible defect.  The wall motion demonstrates diffuse hypokinesia.  The end diastolic volume is 158 ml and end-systolic volume is 102 ml.  The calculated ejection fraction is 36%.  IMPRESSION: No evidence for pharmacologically induced ischemia.  Diffuse hypokinesia with ejection fraction of 36%. Original Report Authenticated By: Richarda Overlie, M.D.   Dg Chest Port 1 View  10/22/2011  *RADIOLOGY REPORT*  Clinical Data: Chest pain, tightness, and dizziness  PORTABLE CHEST - 1 VIEW  Comparison: October 16, 2010 the  Findings: Mild cardiomegaly is unchanged.  The mediastinum and pulmonary vasculature are within normal limits.  Both lungs are clear.  IMPRESSION: Mild cardiomegaly, stable.  Original Report Authenticated By: Brandon Melnick, M.D.   Dg Abd Acute W/chest  10/27/2011  *RADIOLOGY REPORT*  Clinical Data: Upper abdominal pain, nausea, vomiting and constipation.  ACUTE ABDOMEN SERIES (ABDOMEN 2 VIEW & CHEST 1 VIEW)  Comparison: Abdominal radiograph performed 07/14/2004, and chest radiograph performed 10/22/2011  Findings: The lungs are well-aerated and clear.  There is no evidence of focal opacification, pleural effusion or pneumothorax. Pulmonary vascularity is at the upper limits of normal.  The cardiomediastinal silhouette is within normal limits.  The visualized bowel gas pattern is unremarkable. Stool and air are noted throughout the colon; there is no evidence of small bowel dilatation to suggest obstruction.  No free intra-abdominal air is identified on the provided upright view.  No acute osseous abnormalities are seen; the sacroiliac joints are unremarkable in appearance.  There is lumbarization of vertebral body S1.  IMPRESSION:  1.  Unremarkable bowel gas pattern; no free intra-abdominal air seen. 2.  No acute cardiopulmonary process identified.  Original Report Authenticated By: Tonia Ghent, M.D.   Dg Foot Complete Left  11/09/2011  *RADIOLOGY REPORT*  Clinical Data:  Left ankle and foot pain  LEFT FOOT - COMPLETE 3+ VIEW  Comparison: Contemporaneous ankle  Findings: No acute fracture or dislocation. Lisfranc joint intact. No aggressive osseous lesions. Posterior talar spurring. No radiopaque foreign body  IMPRESSION: No acute osseous abnormality identified.  Original Report Authenticated By: Waneta Martins, M.D.     ECHO:None this admission     CARDIAC CATH & OTHER PROCEDURES: Please see above for imaging results.  Recent Results (from the past 240 hour(s))  CULTURE, BLOOD (ROUTINE X 2)     Status: Normal (Preliminary result)   Collection Time   11/15/11  3:31 PM      Component Value Range Status Comment   Specimen Description BLOOD LEFT ARM   Final    Special Requests BOTTLES DRAWN AEROBIC AND ANAEROBIC 5CC   Final    Culture  Setup Time [id number]   Final    Culture     Final    Value:        BLOOD CULTURE RECEIVED NO GROWTH TO DATE CULTURE WILL BE HELD FOR 5 DAYS BEFORE ISSUING A FINAL NEGATIVE REPORT   Report Status PENDING   Incomplete   CULTURE, BLOOD (ROUTINE X 2)     Status: Normal (Preliminary result)   Collection Time   11/15/11  3:35 PM      Component Value Range Status Comment   Specimen Description BLOOD RIGHT ARM   Final    Special Requests     Final    Value: BOTTLES DRAWN AEROBIC AND ANAEROBIC 6CC AER 4CC ANA   Culture  Setup Time [id number]   Final    Culture     Final    Value:        BLOOD CULTURE RECEIVED NO GROWTH TO DATE CULTURE WILL BE HELD FOR 5 DAYS BEFORE ISSUING A FINAL NEGATIVE REPORT   Report Status PENDING   Incomplete     BRIEF ADMITTING H & P: Pt is a 36 y/o with h/o DM, 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  Pain was so severe reportedly that patient was not able to ambulate.  Thus due to this initial compliant a CT of head was ordered which  came  back negative.  Uric acid during admission was elevated and patient was treated for gout.  He was placed on indomethacin, colchicine, prednisone.  He improved on this regimen.  His WBC count however throughout his course of stay was elevated and it was decided to evaluate the patient further and thus infectious disease was consulted due to a leukocytosis of 19 although patient was afebrile.  At that juncture patient was ordered an MRI of ankle which came back negative for signs of infection and was more consistant with gout.    Disposition:  I will discharge the patient on indomethacin but only give him 5 days due to his cardiac history, colchicine at 0.6 mg daily, and have discontinued the prednisone.  Patient is much improved today and he has been taught by our dietician what foods to avoid to prevent gout flare ups.  He is to follow up with his primary care physician in ~ 1 week and is to get his CBC rechecked at that point.  Currently patient is afebrile pain is tolerable and is agreeable to current medical plan.  Will discharge him with a rolling walker to help with ambulation.  No resolved problems to display.  Active Hospital Problems  Diagnoses Date Noted   . Gout attack 11/10/2011     Priority: High  . Leukocytosis 11/11/2011     Priority: Medium  . CHF 12/23/2010     Priority: Medium  . Constipation 11/14/2011   . BRBPR (bright red blood per rectum) 11/13/2011   . OSA (obstructive sleep apnea) 10/23/2011   . Cardiomyopathy 10/22/2011   . OBESITY 12/23/2010   . DIABETES MELLITUS, TYPE II, BORDERLINE 12/23/2010     Resolved Hospital Problems  Diagnoses Date Noted Date Resolved     Disposition and Follow-up: as indicated below. Discharge Orders    Future Orders Please Complete By Expires   Diet - low sodium heart healthy      Increase activity slowly      Discharge instructions      Comments:   Please follow up with Dr. Collene Schlichter in 1 week.  Will need to have CBC rechecked.     Driving Restrictions      Comments:   May not drive while on pain (opiod) medication.  Please follow all warning on perscription labels.     Follow-up Information    Follow up with Eustace Moore, MD on 12/02/2011. (Please contact [facility] to verify appt on 2/27 at 8:00 am. Contact [phone]. Please call if you have to miss or change appt to delay  care in future. )           DISCHARGE EXAM:  General: Alert, awake, oriented x3, in no acute distress. HEENT: No bruits, no goiter. Heart: Regular rate and rhythm, without murmurs, rubs, gallops. Lungs: Clear to auscultation bilaterally. Abdomen: Soft, nontender, nondistended, positive bowel sounds. Extremities: No clubbing cyanosis or edema with positive pedal pulses. No erythema or cellulitis over Left ankle.  Palpation over medial mediolus elicits no pain. Neuro: Grossly intact, nonfocal.    Blood pressure 125/66, pulse 64, temperature 98.5 F (36.9 C), temperature source Oral, resp. rate 16, height 5\' 7"  (1.702 m), weight 136.4 kg (300 lb 11.3 oz), SpO2 97.00%.  No results found for this basename: NA:2,K:2,CL:2,CO2:2,GLUCOSE:2,BUN:2,CREATININE:2,CALCIUM:2,MG:2,PHOS:2 in the last 72 hours No results found for this basename: AST:2,ALT:2,ALKPHOS:2,BILITOT:2,PROT:2,ALBUMIN:2 in the last 72 hours No results found for this basename: LIPASE:2,AMYLASE:2 in the last 72 hours  Basename 11/17/11 0850 11/16/11 1610  WBC 17.6* 19.1*  NEUTROABS -- --  HGB 14.2 13.7  HCT 41.0 40.2  MCV 83.0 83.6  PLT 449* 438*    Signed: Penny Pia M.D. 11/17/2011, 1:42 PM

## 2011-11-17 NOTE — Progress Notes (Signed)
Utilization review complete 

## 2011-11-21 LAB — CULTURE, BLOOD (ROUTINE X 2)
Culture  Setup Time: 201302102039
Culture: NO GROWTH

## 2011-11-25 NOTE — Discharge Summary (Signed)
DISCHARGE SUMMARY  Jeffrey [name]  MR#: [id number]  DOB:12-Sep-1976  Date of Admission: 10/22/2011 Date of Discharge: 11/25/2011  Attending Physician:Quentyn Kolbeck  Patient's NWG:[name],[name], MD, MD  Consults:Treatment Team:  Lesleigh Noe, MD  Presenting Complaint: Chest pain  Discharge Diagnoses: Principal Problem:  *Chest pain- musculoskeletal Active Problems:  HYPERTENSION, BENIGN ESSENTIAL  Diabetes mellitus type 2, uncontrolled  Cardiomyopathy/Systolic and diastolic CHF, chronic- EF 40-45% and grade 1 diastolic dysfunction  Obesities, morbid  OSA (obstructive sleep apnea)    Discharge Medications: Medication List  As of 11/25/2011  8:36 AM   TAKE these medications         furosemide 40 MG tablet   Commonly known as: LASIX   Take 40 mg by mouth 2 (two) times daily.      gemfibrozil 600 MG tablet   Commonly known as: LOPID   Take 600 mg by mouth 2 (two) times daily before a meal.      insulin NPH-insulin regular (70-30) 100 UNIT/ML injection   Commonly known as: NOVOLIN 70/30   Inject 34 Units into the skin 2 (two) times daily with a meal.      lisinopril 20 MG tablet   Commonly known as: PRINIVIL,ZESTRIL   Take 20 mg by mouth daily.      metformin 500 MG (OSM) 24 hr tablet   Commonly known as: FORTAMET   Take 500 mg by mouth 2 (two) times daily.      metoprolol 50 MG tablet   Commonly known as: LOPRESSOR   Take 50 mg by mouth 2 (two) times daily.             Procedures: Dg Ankle 2 Views Left  11/15/2011  *RADIOLOGY REPORT*  Clinical Data: Left ankle pain  LEFT ANKLE - 2 VIEW  Comparison: 11/09/2011  Findings: Two views of the left ankle submitted.  No acute fracture or subluxation.  Ankle mortise is preserved.  IMPRESSION: No acute fracture or subluxation.  Original Report Authenticated By: Natasha Mead, M.D.   Dg Ankle Complete Left  11/09/2011  *RADIOLOGY REPORT*  Clinical Data: Left ankle and foot pain.  LEFT ANKLE COMPLETE - 3+ VIEW   Comparison: Contemporaneous foot  Findings: Soft tissue swelling overlies the lateral malleolus.  No acute fracture or dislocation identified.  Ankle mortise remains intact.  No aggressive osseous lesion.  IMPRESSION: Soft tissue swelling overlies lateral malleolus.  No acute osseous abnormality identified.  Original Report Authenticated By: Waneta Martins, M.D.   Mr Ankle Left W Wo Contrast  11/17/2011  *RADIOLOGY REPORT*  Clinical Data: Left ankle pain and swelling. Gout.  MRI OF THE LEFT ANKLE WITHOUT AND WITH CONTRAST  Technique:  Multiplanar, multisequence MR imaging was performed both before and after administration of intravenous contrast.  Contrast: 20mL MULTIHANCE GADOBENATE DIMEGLUMINE 529 MG/ML IV SOLN  Comparison: Radiographs dated 02/10 and 11/09/2011  Findings: The scan demonstrates the patient has edema and abnormal enhancement of the synovium in the lateral aspect of the ankle joint and in the sinus tarsi.  No significant arthritic changes.  The tendons and ligaments of the medial and lateral aspects of the ankle are normal.  Achilles tendon is normal.  Plantar fascia is normal.  IMPRESSION: Synovitis of the lateral aspect of the ankle joint and in the sinus tarsi.  No other significant abnormalities.  I suspect this is due to the patient's gout.  Original Report Authenticated By: Gwynn Burly, M.D.   Dg Abd Acute W/chest  10/27/2011  *  RADIOLOGY REPORT*  Clinical Data: Upper abdominal pain, nausea, vomiting and constipation.  ACUTE ABDOMEN SERIES (ABDOMEN 2 VIEW & CHEST 1 VIEW)  Comparison: Abdominal radiograph performed 07/14/2004, and chest radiograph performed 10/22/2011  Findings: The lungs are well-aerated and clear.  There is no evidence of focal opacification, pleural effusion or pneumothorax. Pulmonary vascularity is at the upper limits of normal.  The cardiomediastinal silhouette is within normal limits.  The visualized bowel gas pattern is unremarkable. Stool and air are noted  throughout the colon; there is no evidence of small bowel dilatation to suggest obstruction.  No free intra-abdominal air is identified on the provided upright view.  No acute osseous abnormalities are seen; the sacroiliac joints are unremarkable in appearance.  There is lumbarization of vertebral body S1.  IMPRESSION:  1.  Unremarkable bowel gas pattern; no free intra-abdominal air seen. 2.  No acute cardiopulmonary process identified.  Original Report Authenticated By: Tonia Ghent, M.D.   Dg Foot Complete Left  11/09/2011  *RADIOLOGY REPORT*  Clinical Data: Left ankle and foot pain  LEFT FOOT - COMPLETE 3+ VIEW  Comparison: Contemporaneous ankle  Findings: No acute fracture or dislocation. Lisfranc joint intact. No aggressive osseous lesions. Posterior talar spurring. No radiopaque foreign body  IMPRESSION: No acute osseous abnormality identified.  Original Report Authenticated By: Waneta Martins, M.D.    Myoview - 1/19/13IMPRESSION:  No evidence for pharmacologically induced ischemia.  Diffuse hypokinesia with ejection fraction of 36%.    Hospital Course: Principal Problem:  *Chest pain This is a 36 y/o with uncontrolled DM, HTN, CHF, and morbid obesity who presented to the ER with chest tightness for about 5 days prior to admission. Due to his multiple co-morbidities, he was admitted for a cardiac work up. Cardiac enzymes were negative times 3 sets. A cardio consult was requested and he was evaluated by Dr Verdis Prime who recommended a myoview stress test. This was performed on 1/19 and was negative for reversible ischemia. His chest pain is actually reproducible and I suspect it is musculoskeletal.  Active Problems:   HYPERTENSION, BENIGN ESSENTIAL-  He is to continue Lisinopril and Metoprolol   Diabetes mellitus type 2, uncontrolled He has been started on insulin during this admission which needs to be further adjusted by his PCP. His A1c was 12.4.    Cardiomyopathy/Systolic and  diastolic CHF, chronic Cont ACE I and B blocker.   Obesities, morbid  OSA (obstructive sleep apnea)    Day of Discharge Physical Exam: BP 107/76  Pulse 78  Temp(Src) 98.8 F (37.1 C) (Oral)  Resp 20  Ht 5\' 7"  (1.702 m)  Wt 134.174 kg (295 lb 12.8 oz)  BMI 46.33 kg/m2  SpO2 93% General appearance: alert and cooperative  Head: Normocephalic, without obvious abnormality, atraumatic  Throat: lips, mucosa, and tongue normal; teeth and gums normal  Lungs: clear to auscultation bilaterally  Chest wall: no tenderness, upper sternal and left chest is tender  Abdomen: soft, non-tender; bowel sounds normal; no masses, no organomegaly  Extremities: extremities normal, atraumatic, no cyanosis or edema   No results found for this or any previous visit (from the past 24 hour(s)).  Disposition: stable   Follow-up Appts: Discharge Orders    Future Orders Please Complete By Expires   Diet - low sodium heart healthy      Comments:   And Diabetic   Increase activity slowly      Discharge instructions      Comments:   Try a heating pad  and ice for your chest wall pain.      Follow-up with Dr. Drue Second in 2 weeks.  Time on Discharge: >64min  Signed: Bhakti Labella 11/25/2011, 8:36 AM

## 2012-04-03 ENCOUNTER — Emergency Department (HOSPITAL_COMMUNITY): Payer: Self-pay

## 2012-04-03 ENCOUNTER — Emergency Department (HOSPITAL_COMMUNITY)
Admission: EM | Admit: 2012-04-03 | Discharge: 2012-04-03 | Disposition: A | Discharge status: Home / Self Care | Payer: Self-pay | Attending: Emergency Medicine | Admitting: Emergency Medicine

## 2012-04-03 ENCOUNTER — Encounter (HOSPITAL_COMMUNITY): Payer: Self-pay | Admitting: Emergency Medicine

## 2012-04-03 DIAGNOSIS — E119 Type 2 diabetes mellitus without complications: Secondary | ICD-10-CM | POA: Insufficient documentation

## 2012-04-03 DIAGNOSIS — M25579 Pain in unspecified ankle and joints of unspecified foot: Secondary | ICD-10-CM | POA: Insufficient documentation

## 2012-04-03 DIAGNOSIS — S46912A Strain of unspecified muscle, fascia and tendon at shoulder and upper arm level, left arm, initial encounter: Secondary | ICD-10-CM

## 2012-04-03 DIAGNOSIS — Z79899 Other long term (current) drug therapy: Secondary | ICD-10-CM | POA: Insufficient documentation

## 2012-04-03 DIAGNOSIS — Z87891 Personal history of nicotine dependence: Secondary | ICD-10-CM | POA: Insufficient documentation

## 2012-04-03 DIAGNOSIS — Y999 Unspecified external cause status: Secondary | ICD-10-CM | POA: Insufficient documentation

## 2012-04-03 DIAGNOSIS — IMO0002 Reserved for concepts with insufficient information to code with codable children: Secondary | ICD-10-CM | POA: Insufficient documentation

## 2012-04-03 DIAGNOSIS — W010XXA Fall on same level from slipping, tripping and stumbling without subsequent striking against object, initial encounter: Secondary | ICD-10-CM | POA: Insufficient documentation

## 2012-04-03 DIAGNOSIS — Y93E1 Activity, personal bathing and showering: Secondary | ICD-10-CM | POA: Insufficient documentation

## 2012-04-03 DIAGNOSIS — I10 Essential (primary) hypertension: Secondary | ICD-10-CM | POA: Insufficient documentation

## 2012-04-03 DIAGNOSIS — M25529 Pain in unspecified elbow: Secondary | ICD-10-CM | POA: Insufficient documentation

## 2012-04-03 DIAGNOSIS — Z794 Long term (current) use of insulin: Secondary | ICD-10-CM | POA: Insufficient documentation

## 2012-04-03 MED ORDER — HYDROCODONE-ACETAMINOPHEN 5-325 MG PO TABS
1.0000 | ORAL_TABLET | Freq: Once | ORAL | Status: AC
  Administered 2012-04-03: 1 via ORAL
  Filled 2012-04-03: qty 1

## 2012-04-03 MED ORDER — HYDROCODONE-ACETAMINOPHEN 5-325 MG PO TABS: 1.0000 | ORAL_TABLET | ORAL | Status: AC | PRN

## 2012-04-03 NOTE — ED Provider Notes (Signed)
History     CSN: [id number]  Arrival date & time 04/03/12  1958   First MD Initiated Contact with Patient 04/03/12 2032      Chief Complaint  Patient presents with  . Fall  . Arm Pain  . Ankle Pain    (Consider location/radiation/quality/duration/timing/severity/associated sxs/prior treatment) HPI Comments: Patient presents tonight with left shoulder elbow and arm pain and right ankle pain after having slipped in the shower last night - states that initially it did not hurt him as much - states that he took a tramadol without relief of his pain - has a history of gout and takes medication for this but does not think this is related - here ambulatory with left shoulder and elbow pain - reports decrease in ROM due to the pain - reports weakness related to the pain - denies numbness, tingling, loss of control of bowels or bladder.  Patient is a 36 y.o. male presenting with fall, arm pain, and ankle pain. The history is provided by the patient. No language interpreter was used.  Fall The accident occurred yesterday. The fall occurred while standing. He fell from a height of 3 to 5 ft. He landed on a hard floor. There was no blood loss. The point of impact was the left shoulder. The pain is present in the left shoulder (right ankle). The pain is at a severity of 10/10. The pain is severe. He was ambulatory at the scene. There was no entrapment after the fall. There was no drug use involved in the accident. There was no alcohol use involved in the accident. Pertinent negatives include no fever, no abdominal pain, no nausea, no vomiting and no headaches.  Arm Pain Associated symptoms include arthralgias and myalgias. Pertinent negatives include no abdominal pain, chest pain, chills, fever, headaches, joint swelling, nausea, neck pain, rash or vomiting.  Ankle Pain     Past Medical History  Diagnosis Date  . Diabetes mellitus   . CHF (congestive heart failure)     EF 20-25%  . Hypertension     . Obesity   . Obstructive sleep apnea     Past Surgical History  Procedure Date  . Appendectomy     No family history on file.  History  Substance Use Topics  . Smoking status: Former Games developer  . Smokeless tobacco: Not on file  . Alcohol Use: No     quit etoh 2011      Review of Systems  Constitutional: Negative for fever and chills.  HENT: Negative for neck pain and neck stiffness.   Eyes: Negative for pain.  Respiratory: Negative for chest tightness and shortness of breath.   Cardiovascular: Negative for chest pain.  Gastrointestinal: Negative for nausea, vomiting and abdominal pain.  Genitourinary: Negative for dysuria.  Musculoskeletal: Positive for myalgias and arthralgias. Negative for back pain, joint swelling and gait problem.  Skin: Negative for rash.  Neurological: Negative for headaches.  All other systems reviewed and are negative.    Allergies  Aspirin and Tomato  Home Medications   Current Outpatient Rx  Name Route Sig Dispense Refill  . COLCHICINE 0.6 MG PO TABS Oral Take 1 tablet (0.6 mg total) by mouth daily. 15 tablet 0  . FUROSEMIDE 40 MG PO TABS Oral Take 40 mg by mouth 2 (two) times daily.     [name] GEMFIBROZIL 600 MG PO TABS Oral Take 600 mg by mouth 2 (two) times daily before a meal.    . INSULIN ISOPHANE &  REGULAR (70-30) 100 UNIT/ML [place] SUSP Subcutaneous Inject 34 Units into the skin 2 (two) times daily with a meal. 10 mL 12  . LISINOPRIL 20 MG PO TABS Oral Take 20 mg by mouth daily.    [name] METFORMIN HCL ER (OSM) 500 MG PO TB24 Oral Take 500 mg by mouth 2 (two) times daily.     [name] METOPROLOL TARTRATE 50 MG PO TABS Oral Take 50 mg by mouth 2 (two) times daily.    [name] HYDROCODONE-ACETAMINOPHEN 5-325 MG PO TABS Oral Take 1 tablet by mouth every 4 (four) hours as needed for pain. 10 tablet 0    BP 153/100  Pulse 94  Temp 99.6 F (37.6 C) (Oral)  Resp 20  SpO2 96%  Physical Exam  Nursing note and vitals reviewed. Constitutional: He is oriented  to person, place, and time. He appears well-developed and well-nourished. No distress.  HENT:  Head: Normocephalic and atraumatic.  Right Ear: External ear normal.  Left Ear: External ear normal.  Nose: Nose normal.  Mouth/Throat: No oropharyngeal exudate.  Eyes: Conjunctivae are normal. Pupils are equal, round, and reactive to light. No scleral icterus.  Neck: Normal range of motion. Neck supple.  Cardiovascular: Normal rate, regular rhythm and normal heart sounds.  Exam reveals no gallop and no friction rub.   No murmur heard. Pulmonary/Chest: Effort normal. No respiratory distress. He has no wheezes. He has no rales. He exhibits no tenderness.  Abdominal: Soft. Bowel sounds are normal. He exhibits no distension.  Musculoskeletal:       Left shoulder: He exhibits decreased range of motion, tenderness and pain. He exhibits no bony tenderness, normal pulse and normal strength.       Left elbow: He exhibits decreased range of motion. He exhibits no swelling, no effusion, no deformity and no laceration. tenderness found. Medial epicondyle, lateral epicondyle and olecranon process tenderness noted.       Right ankle: He exhibits normal range of motion, no swelling, no ecchymosis and no deformity. no tenderness.  Lymphadenopathy:    He has no cervical adenopathy.  Neurological: He is alert and oriented to person, place, and time. No cranial nerve deficit. He exhibits normal muscle tone. Coordination normal.  Skin: Skin is warm and dry. No rash noted. No erythema. No pallor.  Psychiatric: He has a normal mood and affect. His behavior is normal. Judgment and thought content normal.    ED Course  Procedures (including critical care time)  Labs Reviewed - No data to display Dg Forearm Left  04/03/2012  *RADIOLOGY REPORT*  Clinical Data: Fall, arm pain.  LEFT FOREARM - 2 VIEW  Comparison: None.  Findings: No acute bony abnormality.  Specifically, no fracture, subluxation, or dislocation.  Soft  tissues are intact.  IMPRESSION: No acute bony abnormality.  Original Report Authenticated By: Cyndie Chime, M.D.   Dg Ankle Complete Right  04/03/2012  *RADIOLOGY REPORT*  Clinical Data: Fall, ankle pain.  RIGHT ANKLE - COMPLETE 3+ VIEW  Comparison: None  Findings: No acute bony abnormality.  Specifically, no fracture, subluxation, or dislocation.  Soft tissues are intact.  Joint space is maintained.  IMPRESSION: No acute bony abnormality.  Original Report Authenticated By: Cyndie Chime, M.D.   Dg Shoulder Left  04/03/2012  *RADIOLOGY REPORT*  Clinical Data: Fall, arm pain.  LEFT SHOULDER - 2+ VIEW  Comparison: None.  Findings: No acute bony abnormality.  Specifically, no fracture, subluxation, or dislocation.  Soft tissues are intact.  IMPRESSION: No acute bony  abnormality.  Original Report Authenticated By: Cyndie Chime, M.D.     1. Left shoulder strain       MDM  Patient here with left shoulder pain s/p fall in the shower - pain out of proportion to the exam - not relieved with norco - will place in sling and ask that he follow up with ortho - this response could be because of underlying arthopathies with his gout as well.        Izola Price [place], [place] 04/03/12 2327

## 2012-04-03 NOTE — Progress Notes (Signed)
Orthopedic Tech Progress Note Patient Details:  Jeffrey [name] [birth year] [id number]  Ortho Devices Type of Ortho Device: Arm foam sling Ortho Device/Splint Location: left arm Ortho Device/Splint Interventions: Application   Nikki Dom 04/03/2012, 9:29 PM

## 2012-04-03 NOTE — Discharge Instructions (Signed)
Shoulder Sprain       A shoulder sprain is the result of damage to the tough, fiber-like tissues (ligaments) that help hold your shoulder in place. The ligaments may be stretched or torn. Besides the main shoulder joint (the ball and socket), there are several smaller joints that connect the bones in this area. A sprain usually involves one of those joints. Most often it is the acromioclavicular (or AC) joint. That is the joint that connects the collarbone (clavicle) and the shoulder blade (scapula) at the top point of the shoulder blade (acromion).   A shoulder sprain is a mild form of what is called a shoulder separation. Recovering from a shoulder sprain may take some time. For some, pain lingers for several months. Most people recover without long term problems.   CAUSES   A shoulder sprain is usually caused by some kind of trauma. This might be:   Falling on an outstretched arm.   Being hit hard on the shoulder.   Twisting the arm.   Shoulder sprains are more likely to occur in people who:   Play sports.   Have balance or coordination problems.  SYMPTOMS   Pain when you move your shoulder.   Limited ability to move the shoulder.   Swelling and tenderness on top of the shoulder.   Redness or warmth in the shoulder.   Bruising.   A change in the shape of the shoulder.  DIAGNOSIS   Your healthcare provider may:   Ask about your symptoms.   Ask about recent activity that might have caused those symptoms.   Examine your shoulder. You may be asked to do simple exercises to test movement. The other shoulder will be examined for comparison.   Order some tests that provide a look inside the body. They can show the extent of the injury. The tests could include:   X-rays.   CT (computed tomography) scan.   MRI (magnetic resonance imaging) scan.  RISKS AND COMPLICATIONS   Loss of full shoulder motion.   Ongoing shoulder pain.  TREATMENT   How long it takes to recover from a shoulder sprain depends on how severe it was.  Treatment options may include:   Rest. You should not use the arm or shoulder until it heals.   Ice. For 2 or 3 days after the injury, put an ice pack on the shoulder up to 4 times a day. It should stay on for 15 to 20 minutes each time. Wrap the ice in a towel so it does not touch your skin.   Over-the-counter medicine to relieve pain.   A sling or brace. This will keep the arm still while the shoulder is healing.   Physical therapy or rehabilitation exercises. These will help you regain strength and motion. Ask your healthcare provider when it is OK to begin these exercises.   Surgery. The need for surgery is rare with a sprained shoulder, but some people may need surgery to keep the joint in place and reduce pain.  HOME CARE INSTRUCTIONS   Ask your healthcare provider about what you should and should not do while your shoulder heals.   Make sure you know how to apply ice to the correct area of your shoulder.   Talk with your healthcare provider about which medications should be used for pain and swelling.   If rehabilitation therapy will be needed, ask your healthcare provider to refer you to a therapist. If it is not recommended, then ask   Your pain, swelling, or redness at the joint increases. SEEK IMMEDIATE MEDICAL CARE IF:   You have a fever.   You cannot move your arm or shoulder.  Document Released: 02/07/2009 Document Revised: 09/10/2011 Document Reviewed: 02/07/2009 [facility] Patient Information 2012 [place], [place].Sprains Sprains are painful injuries to joints as a result of partial or complete tearing of ligaments. HOME CARE INSTRUCTIONS   For the first 24 hours, keep the injured limb raised on 2 pillows while lying down.   Apply ice bags about every 2 hours for 20 to 30 minutes, while awake, to the injured area for the first 24 hours. Then apply as  directed by your caregiver. Place the ice in a plastic bag with a towel around it to prevent frostbite to the skin.   Only take over-the-counter or prescription medicines for pain, discomfort, or fever as directed by your caregiver.   If an ace bandage (a stretchy, elastic wrapping bandage) has been applied today, remove and reapply every 3 to 4 hours. Apply firm enough to keep swelling down. Donot apply tightly. Watch fingers or toes for swelling, bluish discoloration, coldness, numbness, or excessive pain. If any of these problems (symptoms) occur, remove the ace bandage and reapply it more loosely. Contact your caregiver or return to this location if these symptoms persist.  Persistent pain and inability to use the injured area for more than 2 to 3 days are warning signs. See a caregiver for a follow-up visit as soon as possible. A hairline fracture (broken bone) may not show on X-rays. Persistent pain and swelling indicate that further evaluation, use of crutches, and/or more X-rays are needed. X-rays may sometimes not show a small fracture until a week or ten days later. Make a follow-up appointment with your own caregiver or to whom we have referred you. A specialist in reading X-rays(radiologist) will re-read your X-rays. Make sure you know how to obtain your X-ray results. Do not assume everything is normal if you do not hear from [place]. SEEK IMMEDIATE MEDICAL CARE IF:  You develop severe pain or more swelling.   The pain is not controlled with medicine.   Your skin or nails below the injury turn blue or grey or feel cold or numb.  Document Released: 09/18/2000 Document Revised: 09/10/2011 Document Reviewed: 05/07/2008 [facility] Patient Information 2012 [place], [place].

## 2012-04-03 NOTE — ED Notes (Signed)
Pt states he fell out of shower last night.  Denies LOC.  C/o pain to L shoulder, elbow, arm, and R ankle.

## 2012-04-03 NOTE — ED Provider Notes (Signed)
Medical screening examination/treatment/procedure(s) were performed by non-physician practitioner and as supervising physician I was immediately available for consultation/collaboration.  Ethelda Chick, MD 04/03/12 [phone]

## 2012-04-07 ENCOUNTER — Emergency Department (HOSPITAL_COMMUNITY)
Admission: EM | Admit: 2012-04-07 | Discharge: 2012-04-07 | Disposition: A | Discharge status: Home / Self Care | Payer: Self-pay | Attending: Emergency Medicine | Admitting: Emergency Medicine

## 2012-04-07 ENCOUNTER — Encounter (HOSPITAL_COMMUNITY): Payer: Self-pay | Admitting: Emergency Medicine

## 2012-04-07 DIAGNOSIS — E669 Obesity, unspecified: Secondary | ICD-10-CM | POA: Insufficient documentation

## 2012-04-07 DIAGNOSIS — E119 Type 2 diabetes mellitus without complications: Secondary | ICD-10-CM | POA: Insufficient documentation

## 2012-04-07 DIAGNOSIS — Z794 Long term (current) use of insulin: Secondary | ICD-10-CM | POA: Insufficient documentation

## 2012-04-07 DIAGNOSIS — I509 Heart failure, unspecified: Secondary | ICD-10-CM | POA: Insufficient documentation

## 2012-04-07 DIAGNOSIS — Z79899 Other long term (current) drug therapy: Secondary | ICD-10-CM | POA: Insufficient documentation

## 2012-04-07 DIAGNOSIS — Z87891 Personal history of nicotine dependence: Secondary | ICD-10-CM | POA: Insufficient documentation

## 2012-04-07 DIAGNOSIS — I10 Essential (primary) hypertension: Secondary | ICD-10-CM | POA: Insufficient documentation

## 2012-04-07 DIAGNOSIS — M79673 Pain in unspecified foot: Secondary | ICD-10-CM

## 2012-04-07 DIAGNOSIS — M79609 Pain in unspecified limb: Secondary | ICD-10-CM | POA: Insufficient documentation

## 2012-04-07 NOTE — ED Provider Notes (Signed)
History     CSN: [id number]  Arrival date & time 04/07/12  0043   First MD Initiated Contact with Patient 04/07/12 0107      Chief Complaint  Patient presents with  . Foot Pain    (Consider location/radiation/quality/duration/timing/severity/associated sxs/prior treatment) HPI Patient with complaints of pain in right foot but pain occurred with fall and seen here and had x-rays which are negative.  Patient states pain continues right foot diffuse.  Also, has pain in left foot.  Left shoulder in sling.     Past Medical History  Diagnosis Date  . Diabetes mellitus   . CHF (congestive heart failure)     EF 20-25%  . Hypertension   . Obesity   . Obstructive sleep apnea     Past Surgical History  Procedure Date  . Appendectomy     No family history on file.  History  Substance Use Topics  . Smoking status: Former Games developer  . Smokeless tobacco: Not on file  . Alcohol Use: No     quit etoh 2011      Review of Systems  All other systems reviewed and are negative.    Allergies  Aspirin and Tomato  Home Medications   Current Outpatient Rx  Name Route Sig Dispense Refill  . COLCHICINE 0.6 MG PO TABS Oral Take 0.6 mg by mouth daily.    . FUROSEMIDE 40 MG PO TABS Oral Take 40 mg by mouth 2 (two) times daily.     [name] GEMFIBROZIL 600 MG PO TABS Oral Take 600 mg by mouth 2 (two) times daily before a meal.    . HYDROCODONE-ACETAMINOPHEN 5-325 MG PO TABS Oral Take 1 tablet by mouth every 4 (four) hours as needed for pain. 10 tablet 0  . INSULIN ISOPHANE & REGULAR (70-30) 100 UNIT/ML [place] SUSP Subcutaneous Inject 34 Units into the skin 2 (two) times daily with a meal. 10 mL 12  . LISINOPRIL 20 MG PO TABS Oral Take 20 mg by mouth daily.    [name] METFORMIN HCL ER (OSM) 500 MG PO TB24 Oral Take 500 mg by mouth 2 (two) times daily.     [name] METOPROLOL TARTRATE 50 MG PO TABS Oral Take 50 mg by mouth 2 (two) times daily.    [name] NAPROXEN SODIUM 220 MG PO TABS Oral Take 220 mg by mouth daily  as needed. For pain      BP 153/98  Pulse 88  Temp 100 F (37.8 C) (Oral)  Resp 16  SpO2 96%  Physical Exam  Nursing note and vitals reviewed. Constitutional: He appears well-developed and well-nourished.  HENT:  Head: Normocephalic.  Eyes: Pupils are equal, round, and reactive to light.  Musculoskeletal: Normal range of motion.       Right foot with mild diffuse tenderness, no point tenderness, sensation intact.   Neurological: He is alert.  Skin: Skin is warm and dry.  Psychiatric: He has a normal mood and affect.    ED Course  Procedures (including critical care time)  Labs Reviewed - No data to display No results found.   No diagnosis found.    MDM  Patient advised to use crutches, ice and elevate.  Will give post op boot here.        Hilario Quarry, MD 04/07/12 [phone]

## 2012-04-07 NOTE — ED Notes (Signed)
PT. REPORTS PERSISTENT RIGHT FOOT/RIGHT ANKLE PAIN ONSET LAST Sunday , SEEN HERE X-RAYS DONE AND PRESCRIBED WITH HYDROCODONE WITH NO RELIEF.

## 2012-04-07 NOTE — ED Notes (Signed)
First contact with pt..

## 2012-04-07 NOTE — ED Notes (Signed)
Patient states he feel several days ago was seen for falling out of the shower.  Tonight he is here with right foot swollen and painful.

## 2012-06-29 ENCOUNTER — Emergency Department (HOSPITAL_COMMUNITY)
Admission: EM | Admit: 2012-06-29 | Discharge: 2012-06-30 | Disposition: A | Discharge status: Home / Self Care | Payer: Self-pay | Attending: Emergency Medicine | Admitting: Emergency Medicine

## 2012-06-29 ENCOUNTER — Emergency Department (HOSPITAL_COMMUNITY): Payer: Self-pay

## 2012-06-29 ENCOUNTER — Encounter (HOSPITAL_COMMUNITY): Payer: Self-pay | Admitting: Family Medicine

## 2012-06-29 DIAGNOSIS — Z794 Long term (current) use of insulin: Secondary | ICD-10-CM | POA: Insufficient documentation

## 2012-06-29 DIAGNOSIS — G510 Bell's palsy: Secondary | ICD-10-CM | POA: Insufficient documentation

## 2012-06-29 DIAGNOSIS — E119 Type 2 diabetes mellitus without complications: Secondary | ICD-10-CM | POA: Insufficient documentation

## 2012-06-29 DIAGNOSIS — I10 Essential (primary) hypertension: Secondary | ICD-10-CM | POA: Insufficient documentation

## 2012-06-29 DIAGNOSIS — Z9089 Acquired absence of other organs: Secondary | ICD-10-CM | POA: Insufficient documentation

## 2012-06-29 DIAGNOSIS — I509 Heart failure, unspecified: Secondary | ICD-10-CM | POA: Insufficient documentation

## 2012-06-29 DIAGNOSIS — Z79899 Other long term (current) drug therapy: Secondary | ICD-10-CM | POA: Insufficient documentation

## 2012-06-29 DIAGNOSIS — J329 Chronic sinusitis, unspecified: Secondary | ICD-10-CM | POA: Insufficient documentation

## 2012-06-29 DIAGNOSIS — E669 Obesity, unspecified: Secondary | ICD-10-CM | POA: Insufficient documentation

## 2012-06-29 LAB — CBC WITH DIFFERENTIAL/PLATELET
HCT: 41.7 % (ref 39.0–52.0)
Hemoglobin: 14.5 g/dL (ref 13.0–17.0)
Lymphocytes Relative: 40 % (ref 12–46)
MCHC: 34.8 g/dL (ref 30.0–36.0)
MCV: 82.9 fL (ref 78.0–100.0)
Monocytes Absolute: 0.6 10*3/uL (ref 0.1–1.0)
Monocytes Relative: 6 % (ref 3–12)
Neutro Abs: 5.5 10*3/uL (ref 1.7–7.7)
WBC: 10.8 10*3/uL — ABNORMAL HIGH (ref 4.0–10.5)

## 2012-06-29 LAB — COMPREHENSIVE METABOLIC PANEL
BUN: 13 mg/dL (ref 6–23)
CO2: 24 mEq/L (ref 19–32)
Chloride: 103 mEq/L (ref 96–112)
Creatinine, Ser: 1.03 mg/dL (ref 0.50–1.35)
GFR calc Af Amer: >90 mL/min (ref >90)
GFR calc non Af Amer: >90 mL/min (ref >90)
Glucose, Bld: 135 mg/dL — ABNORMAL HIGH (ref 70–99)
Total Bilirubin: 0.2 mg/dL — ABNORMAL LOW (ref 0.3–1.2)

## 2012-06-29 LAB — GLUCOSE, CAPILLARY: Glucose-Capillary: 140 mg/dL — ABNORMAL HIGH (ref 70–99)

## 2012-06-29 NOTE — ED Provider Notes (Signed)
History     CSN: [id number]  Arrival date & time 06/29/12  1900   First MD Initiated Contact with Patient 06/29/12 2344      Chief Complaint  Patient presents with  . Blurred Vision  . URI  . Numbness    (Consider location/radiation/quality/duration/timing/severity/associated sxs/prior treatment) HPI Comments: This 36 year old male with past medical history of CHF, hyperlipidemia, hypertension, diabetes, who presents with one-week history of cough and cold-like symptoms. Today he began to experience facial numbness, and blurred vision. Patient reports that he was unable to puff out his cheeks while playing with his daughter. He states that his symptoms have not changed throughout the course of the day. He has tried taking Robitussin for the sinus congestion and has experienced some relief.    The history is provided by the patient. No language interpreter was used.    Past Medical History  Diagnosis Date  . Diabetes mellitus   . CHF (congestive heart failure)     EF 20-25%  . Hypertension   . Obesity   . Obstructive sleep apnea     Past Surgical History  Procedure Date  . Appendectomy     History reviewed. No pertinent family history.  History  Substance Use Topics  . Smoking status: Former Games developer  . Smokeless tobacco: Not on file  . Alcohol Use: No     quit etoh 2011      Review of Systems  Constitutional: Negative for fever and fatigue.  HENT: Positive for sinus pressure.        Taste disturbance, blinking more frequently, increased right eye watering  Eyes: Positive for discharge.  Respiratory: Negative for shortness of breath.   Cardiovascular: Negative for chest pain.  Gastrointestinal: Negative for nausea, diarrhea and constipation.  Genitourinary: Negative for dysuria.  Neurological: Positive for facial asymmetry.  All other systems reviewed and are negative.    Allergies  Aspirin and Tomato  Home Medications   Current Outpatient Rx  Name  Route Sig Dispense Refill  . COLCHICINE 0.6 MG PO TABS Oral Take 0.6 mg by mouth daily.    . FUROSEMIDE 40 MG PO TABS Oral Take 40 mg by mouth 2 (two) times daily.     [name] GEMFIBROZIL 600 MG PO TABS Oral Take 600 mg by mouth 2 (two) times daily before a meal.    . INSULIN ISOPHANE & REGULAR (70-30) 100 UNIT/ML [place] SUSP Subcutaneous Inject 34 Units into the skin 2 (two) times daily with a meal. 10 mL 12  . LISINOPRIL 20 MG PO TABS Oral Take 20 mg by mouth daily.    [name] METFORMIN HCL ER (OSM) 500 MG PO TB24 Oral Take 500 mg by mouth 2 (two) times daily.     [name] METOPROLOL TARTRATE 50 MG PO TABS Oral Take 50 mg by mouth 2 (two) times daily.    [name] NAPROXEN SODIUM 220 MG PO TABS Oral Take 220 mg by mouth daily as needed. For pain      BP 138/79  Pulse 72  Temp 98.2 F (36.8 C) (Oral)  Resp 18  SpO2 99%  Physical Exam  Nursing note and vitals reviewed. Constitutional: He is oriented to person, place, and time. He appears well-developed and well-nourished.  HENT:  Head: Normocephalic and atraumatic.  Eyes: Conjunctivae normal and EOM are normal. Pupils are equal, round, and reactive to light.  Neck: Normal range of motion. Neck supple.  Cardiovascular: Normal rate, regular rhythm and normal heart sounds.   Pulmonary/Chest: Effort  normal and breath sounds normal.  Abdominal: Soft. Bowel sounds are normal.  Musculoskeletal: Normal range of motion.  Lymphadenopathy:    He has cervical adenopathy.  Neurological: He is alert and oriented to person, place, and time.       Right-sided facial nerve dysfunction  Skin: Skin is warm.  Psychiatric: He has a normal mood and affect. His behavior is normal. Judgment and thought content normal.    ED Course  Procedures (including critical care time)  Labs Reviewed  CBC WITH DIFFERENTIAL - Abnormal; Notable for the following:    WBC 10.8 (*)     Platelets 411 (*)     Lymphs Abs 4.3 (*)     All other components within normal limits  COMPREHENSIVE  METABOLIC PANEL - Abnormal; Notable for the following:    Glucose, Bld 135 (*)     Total Bilirubin 0.2 (*)     All other components within normal limits  GLUCOSE, CAPILLARY - Abnormal; Notable for the following:    Glucose-Capillary 140 (*)     All other components within normal limits   Dg Chest 2 View  06/29/2012  *RADIOLOGY REPORT*  Clinical Data: 36 year old male chest pain numbness to the right arm.  CHEST - 2 VIEW  Comparison: 10/16/2010 and earlier.  Findings: Lung volumes are within normal limits.  Cardiac size and mediastinal contours are within normal limits.  Visualized tracheal air column is within normal limits.  No pneumothorax or pleural effusion.  Interval decreased interstitial opacity/vascular congestion.  No edema or acute pulmonary opacity. No acute osseous abnormality identified.  IMPRESSION: No acute cardiopulmonary abnormality.   Original Report Authenticated By: Harley Hallmark, M.D.    Results for orders placed during the hospital encounter of 06/29/12  CBC WITH DIFFERENTIAL      Component Value Range   WBC 10.8 (*) 4.0 - 10.5 K/uL   RBC 5.03  4.22 - 5.81 MIL/uL   Hemoglobin 14.5  13.0 - 17.0 g/dL   HCT 45.4  09.8 - 11.9 %   MCV 82.9  78.0 - 100.0 fL   MCH 28.8  26.0 - 34.0 pg   MCHC 34.8  30.0 - 36.0 g/dL   RDW 14.7  82.9 - 56.2 %   Platelets 411 (*) 150 - 400 K/uL   Neutrophils Relative 51  43 - 77 %   Neutro Abs 5.5  1.7 - 7.7 K/uL   Lymphocytes Relative 40  12 - 46 %   Lymphs Abs 4.3 (*) 0.7 - 4.0 K/uL   Monocytes Relative 6  3 - 12 %   Monocytes Absolute 0.6  0.1 - 1.0 K/uL   Eosinophils Relative 3  0 - 5 %   Eosinophils Absolute 0.3  0.0 - 0.7 K/uL   Basophils Relative 0  0 - 1 %   Basophils Absolute 0.0  0.0 - 0.1 K/uL  COMPREHENSIVE METABOLIC PANEL      Component Value Range   Sodium 139  135 - 145 mEq/L   Potassium 3.8  3.5 - 5.1 mEq/L   Chloride 103  96 - 112 mEq/L   CO2 24  19 - 32 mEq/L   Glucose, Bld 135 (*) 70 - 99 mg/dL   BUN 13  6 - 23  mg/dL   Creatinine, Ser 1.30  0.50 - 1.35 mg/dL   Calcium 86.5  8.4 - 78.4 mg/dL   Total Protein 8.0  6.0 - 8.3 g/dL   Albumin 4.0  3.5 - 5.2 g/dL  AST 20  0 - 37 U/L   ALT 16  0 - 53 U/L   Alkaline Phosphatase 62  39 - 117 U/L   Total Bilirubin 0.2 (*) 0.3 - 1.2 mg/dL   GFR calc non Af Amer >90  >90 mL/min   GFR calc Af Amer >90  >90 mL/min  GLUCOSE, CAPILLARY      Component Value Range   Glucose-Capillary 140 (*) 70 - 99 mg/dL   Dg Chest 2 View  0/98/1191  *RADIOLOGY REPORT*  Clinical Data: 36 year old male chest pain numbness to the right arm.  CHEST - 2 VIEW  Comparison: 10/16/2010 and earlier.  Findings: Lung volumes are within normal limits.  Cardiac size and mediastinal contours are within normal limits.  Visualized tracheal air column is within normal limits.  No pneumothorax or pleural effusion.  Interval decreased interstitial opacity/vascular congestion.  No edema or acute pulmonary opacity. No acute osseous abnormality identified.  IMPRESSION: No acute cardiopulmonary abnormality.   Original Report Authenticated By: Harley Hallmark, M.D.    Ct Head Wo Contrast  06/30/2012  *RADIOLOGY REPORT*  Clinical Data: Facial weakness.  Blurry vision.  Numbness.  CT HEAD WITHOUT CONTRAST  Technique:  Contiguous axial images were obtained from the base of the skull through the vertex without contrast.  Comparison: 10/24/2011.  Findings: No mass lesion, mass effect, midline shift, hydrocephalus, hemorrhage.  No territorial ischemia or acute infarction.  Paranasal sinuses appear within normal limits.  IMPRESSION: Negative CT head.   Original Report Authenticated By: Andreas Newport, M.D.       1. Calloway's palsy   2. Sinusitis       MDM  36 year old male with a history of cough and cold and facial numbness.  CXR, head CT, and labs are as detailed above.  Symptoms are consistent with right sided U[place]ura's Palsy and sinusitis. Will discharge with with acyclovir, prednisone, zyrtec, and zythromax.          Roxy Horseman, PA-C 06/30/12 0159

## 2012-06-29 NOTE — ED Notes (Signed)
TROPONIN RESULTS  cTnl  0.01 ng/mL 

## 2012-06-29 NOTE — ED Notes (Signed)
Pt sts cold for a few days associated with blurry vision, face tightness and numbness all over body. Denies chest pain, SOB.

## 2012-06-30 ENCOUNTER — Emergency Department (HOSPITAL_COMMUNITY): Payer: Self-pay

## 2012-06-30 LAB — POCT I-STAT TROPONIN I: Troponin i, poc: 0.01 ng/mL (ref 0.00–0.08)

## 2012-06-30 MED ORDER — CETIRIZINE HCL 10 MG PO TABS: 10.0000 mg | ORAL_TABLET | Freq: Every day | ORAL | Status: DC

## 2012-06-30 MED ORDER — PREDNISONE 20 MG PO TABS: 40.0000 mg | ORAL_TABLET | Freq: Every day | ORAL | Status: DC

## 2012-06-30 MED ORDER — AZITHROMYCIN 250 MG PO TABS: 250.0000 mg | ORAL_TABLET | Freq: Every day | ORAL | Status: DC

## 2012-06-30 MED ORDER — ACYCLOVIR 400 MG PO TABS: 800.0000 mg | ORAL_TABLET | Freq: Every day | ORAL | Status: DC

## 2012-06-30 NOTE — ED Provider Notes (Signed)
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 On exam the patient has right-sided facial droop which is mild, change in taste, no focal weakness or numbness of his arms or legs, speech is clear, cranial nerves are otherwise normal other than cranial nerve 7 as described above. Workup included CT which was negative, vital signs which were significant only for mild hypertension, no significant leukocytosis or anemia, normal metabolic panel. Chest x-ray negative for acute infiltrates a CT scan of the head which showed no signs of stroke or hemorrhage. Patient given acyclovir, prednisone, stable for discharge.  Medical screening examination/treatment/procedure(s) were conducted as a shared visit with non-physician practitioner(s) and myself.  I personally evaluated the patient during the encounter    Vida Roller, MD 06/30/12 [phone]

## 2012-06-30 NOTE — ED Notes (Addendum)
Reports Friday, had "itchiness in throat", thought it was cold d/t his fiance and daughter had one; reports yesterday had blurred vision, dizziness, numbness in face--reports drainage from R eye; decreased taste

## 2012-09-12 ENCOUNTER — Ambulatory Visit: Payer: Self-pay

## 2012-09-12 ENCOUNTER — Ambulatory Visit: Payer: Self-pay | Admitting: Radiation Oncology

## 2012-09-21 ENCOUNTER — Emergency Department (INDEPENDENT_AMBULATORY_CARE_PROVIDER_SITE_OTHER)
Admission: EM | Admit: 2012-09-21 | Discharge: 2012-09-21 | Disposition: A | Discharge status: Home / Self Care | Payer: No Typology Code available for payment source | Source: Home / Self Care

## 2012-09-21 ENCOUNTER — Encounter (HOSPITAL_COMMUNITY): Payer: Self-pay

## 2012-09-21 DIAGNOSIS — I10 Essential (primary) hypertension: Secondary | ICD-10-CM

## 2012-09-21 DIAGNOSIS — E1165 Type 2 diabetes mellitus with hyperglycemia: Secondary | ICD-10-CM

## 2012-09-21 DIAGNOSIS — I5022 Chronic systolic (congestive) heart failure: Secondary | ICD-10-CM

## 2012-09-21 DIAGNOSIS — I509 Heart failure, unspecified: Secondary | ICD-10-CM

## 2012-09-21 DIAGNOSIS — G4733 Obstructive sleep apnea (adult) (pediatric): Secondary | ICD-10-CM

## 2012-09-21 DIAGNOSIS — E785 Hyperlipidemia, unspecified: Secondary | ICD-10-CM

## 2012-09-21 MED ORDER — FUROSEMIDE 40 MG PO TABS: 40.0000 mg | ORAL_TABLET | Freq: Two times a day (BID) | ORAL | Status: DC

## 2012-09-21 MED ORDER — METOPROLOL TARTRATE 50 MG PO TABS: 50.0000 mg | ORAL_TABLET | Freq: Two times a day (BID) | ORAL | Status: DC

## 2012-09-21 MED ORDER — INSULIN NPH ISOPHANE & REGULAR (70-30) 100 UNIT/ML ~~LOC~~ SUSP: 34.0000 [IU] | Freq: Two times a day (BID) | SUBCUTANEOUS | Status: DC

## 2012-09-21 MED ORDER — GEMFIBROZIL 600 MG PO TABS: 600.0000 mg | ORAL_TABLET | Freq: Two times a day (BID) | ORAL | Status: DC

## 2012-09-21 MED ORDER — LISINOPRIL 20 MG PO TABS: 20.0000 mg | ORAL_TABLET | Freq: Every day | ORAL | Status: DC

## 2012-09-21 MED ORDER — METFORMIN HCL ER (OSM) 500 MG PO TB24: 500.0000 mg | ORAL_TABLET | Freq: Two times a day (BID) | ORAL | Status: DC

## 2012-09-21 MED ORDER — COLCHICINE 0.6 MG PO TABS: 0.6000 mg | ORAL_TABLET | Freq: Every day | ORAL | Status: DC

## 2012-09-21 NOTE — ED Provider Notes (Signed)
History     CSN: [id number]  Arrival date & time 09/21/12  1636   None     Chief Complaint  Patient presents with  . Medication Refill   HPI  Pt says that he is out of his medications at this time.  He says that he had some blood work done right before the [employer] closed.  He says that he normally takes his medications when he is able to afford them but reports that he cannot afford the medication at this time by going to a regular pharmacy with no discounted prices.  He reports no chest pain or shortness of breath.  He reports that his blood sugars have been tested and his highest blood glucose reading has been 130.  He denies hypoglycemia.   Past Medical History  Diagnosis Date  . Diabetes mellitus   . CHF (congestive heart failure)     EF 20-25%  . Hypertension   . Obesity   . Obstructive sleep apnea     Past Surgical History  Procedure Date  . Appendectomy     No family history on file.  History  Substance Use Topics  . Smoking status: Former Games developer  . Smokeless tobacco: Not on file  . Alcohol Use: No     Comment: quit etoh 2011   Review of Systems  Constitutional: Negative.   HENT: Negative.   Eyes: Negative.   Respiratory: Negative.   Cardiovascular: Negative.   Gastrointestinal: Negative.   Musculoskeletal: Negative.   Neurological: Negative.   Hematological: Negative.   Psychiatric/Behavioral: Negative.     Allergies  Aspirin and Tomato  Home Medications   Current Outpatient Rx  Name  Route  Sig  Dispense  Refill  . GEMFIBROZIL 600 MG PO TABS   Oral   Take 600 mg by mouth 2 (two) times daily before a meal.         . ACYCLOVIR 400 MG PO TABS   Oral   Take 2 tablets (800 mg total) by mouth 5 (five) times daily.   50 tablet   0   . AZITHROMYCIN 250 MG PO TABS   Oral   Take 1 tablet (250 mg total) by mouth daily. 500mg  PO day 1, then 250mg  PO days 205   6 tablet   0   . CETIRIZINE HCL 10 MG PO TABS   Oral   Take 1 tablet (10 mg  total) by mouth daily.   30 tablet   1   . COLCHICINE 0.6 MG PO TABS   Oral   Take 0.6 mg by mouth daily.         . FUROSEMIDE 40 MG PO TABS   Oral   Take 40 mg by mouth 2 (two) times daily.          . INSULIN ISOPHANE & REGULAR (70-30) 100 UNIT/ML [place] SUSP   Subcutaneous   Inject 34 Units into the skin 2 (two) times daily with a meal.   10 mL   12   . LISINOPRIL 20 MG PO TABS   Oral   Take 20 mg by mouth daily.         [name] METFORMIN HCL ER (OSM) 500 MG PO TB24   Oral   Take 500 mg by mouth 2 (two) times daily.          [name] METOPROLOL TARTRATE 50 MG PO TABS   Oral   Take 50 mg by mouth 2 (two) times daily.         [name]  PREDNISONE 20 MG PO TABS   Oral   Take 2 tablets (40 mg total) by mouth daily.   20 tablet   0     BP 139/95  Pulse 76  Temp 98.5 F (36.9 C) (Oral)  Resp 19  SpO2 98%  Physical Exam  Nursing note and vitals reviewed. Constitutional: He is oriented to person, place, and time. He appears well-developed and well-nourished. No distress.  HENT:  Head: Normocephalic and atraumatic.  Eyes: EOM are normal. Pupils are equal, round, and reactive to light.  Neck: Neck supple. No JVD present. No thyromegaly present.  Cardiovascular: Normal rate, regular rhythm and normal heart sounds.   No murmur heard. Pulmonary/Chest: Effort normal and breath sounds normal. No respiratory distress. He has no wheezes. He has no rales.  Abdominal: Soft. Bowel sounds are normal.  Neurological: He is alert and oriented to person, place, and time.  Skin: Skin is warm and dry.  Psychiatric: He has a normal mood and affect. His behavior is normal. Judgment and thought content normal.    ED Course  Procedures (including critical care time)  Labs Reviewed - No data to display No results found.   No diagnosis found.  MDM  IMPRESSION  Congestive heart failure, compensated, stable  Hypertension, controlled  Hyperlipidemia  Dyslipidemia  Gout  Insulin  requiring diabetes mellitus, type II  RECOMMENDATIONS / PLAN I did refill of the patient's regular home medications today.  I asked him to please make every effort to get to [facility] for assistance with getting his medications filled.  I asked the patient to continue to monitor his blood glucose closely.  He should be coming back in one month for labs.   FOLLOW UP 1 month for lab work  The patient was given clear instructions to go to ER or return to medical center if symptoms don't improve, worsen or new problems develop.  The patient verbalized understanding.  The patient was told to call to get lab results if they haven't heard anything in the next week.            Cleora Fleet, MD 09/21/12 1921

## 2012-09-21 NOTE — ED Notes (Signed)
[facility]- need medication refill 

## 2012-11-06 ENCOUNTER — Emergency Department (HOSPITAL_COMMUNITY): Payer: Self-pay

## 2012-11-06 ENCOUNTER — Encounter (HOSPITAL_COMMUNITY): Payer: Self-pay | Admitting: Emergency Medicine

## 2012-11-06 DIAGNOSIS — R109 Unspecified abdominal pain: Secondary | ICD-10-CM | POA: Insufficient documentation

## 2012-11-06 DIAGNOSIS — E119 Type 2 diabetes mellitus without complications: Secondary | ICD-10-CM | POA: Insufficient documentation

## 2012-11-06 DIAGNOSIS — E669 Obesity, unspecified: Secondary | ICD-10-CM | POA: Insufficient documentation

## 2012-11-06 DIAGNOSIS — R9389 Abnormal findings on diagnostic imaging of other specified body structures: Secondary | ICD-10-CM | POA: Insufficient documentation

## 2012-11-06 DIAGNOSIS — G4733 Obstructive sleep apnea (adult) (pediatric): Secondary | ICD-10-CM | POA: Insufficient documentation

## 2012-11-06 DIAGNOSIS — Z79899 Other long term (current) drug therapy: Secondary | ICD-10-CM | POA: Insufficient documentation

## 2012-11-06 DIAGNOSIS — I509 Heart failure, unspecified: Secondary | ICD-10-CM | POA: Insufficient documentation

## 2012-11-06 DIAGNOSIS — F17200 Nicotine dependence, unspecified, uncomplicated: Secondary | ICD-10-CM | POA: Insufficient documentation

## 2012-11-06 DIAGNOSIS — Z791 Long term (current) use of non-steroidal anti-inflammatories (NSAID): Secondary | ICD-10-CM | POA: Insufficient documentation

## 2012-11-06 DIAGNOSIS — R079 Chest pain, unspecified: Secondary | ICD-10-CM | POA: Insufficient documentation

## 2012-11-06 DIAGNOSIS — I10 Essential (primary) hypertension: Secondary | ICD-10-CM | POA: Insufficient documentation

## 2012-11-06 DIAGNOSIS — Z794 Long term (current) use of insulin: Secondary | ICD-10-CM | POA: Insufficient documentation

## 2012-11-06 NOTE — ED Notes (Signed)
Patient reports that he had bilateral side pain that started yesterday; began to have mid-sternal chest pain that began today -- describes pain as "cramping" and "sharp".  Reports history of congestive heart failure; patient denies edema.  Complaining of shortness of breath and radiation of chest pain to back.

## 2012-11-07 ENCOUNTER — Emergency Department (HOSPITAL_COMMUNITY): Payer: Self-pay

## 2012-11-07 ENCOUNTER — Emergency Department (HOSPITAL_COMMUNITY)
Admission: EM | Admit: 2012-11-07 | Discharge: 2012-11-07 | Disposition: A | Discharge status: Home / Self Care | Payer: Self-pay | Attending: Emergency Medicine | Admitting: Emergency Medicine

## 2012-11-07 DIAGNOSIS — R109 Unspecified abdominal pain: Secondary | ICD-10-CM

## 2012-11-07 DIAGNOSIS — N2889 Other specified disorders of kidney and ureter: Secondary | ICD-10-CM

## 2012-11-07 LAB — CBC WITH DIFFERENTIAL/PLATELET
Basophils Relative: 0 % (ref 0–1)
Eosinophils Absolute: 0.3 10*3/uL (ref 0.0–0.7)
Hemoglobin: 15.2 g/dL (ref 13.0–17.0)
MCH: 28.7 pg (ref 26.0–34.0)
MCHC: 34.2 g/dL (ref 30.0–36.0)
Monocytes Relative: 7 % (ref 3–12)
Neutrophils Relative %: 50 % (ref 43–77)
Platelets: 330 10*3/uL (ref 150–400)

## 2012-11-07 LAB — COMPREHENSIVE METABOLIC PANEL
Albumin: 3.9 g/dL (ref 3.5–5.2)
Alkaline Phosphatase: 69 U/L (ref 39–117)
BUN: 19 mg/dL (ref 6–23)
Potassium: 3.7 mEq/L (ref 3.5–5.1)
Total Protein: 7.8 g/dL (ref 6.0–8.3)

## 2012-11-07 LAB — LIPASE, BLOOD: Lipase: 27 U/L (ref 11–59)

## 2012-11-07 LAB — PRO B NATRIURETIC PEPTIDE: Pro B Natriuretic peptide (BNP): 9.9 pg/mL (ref 0–125)

## 2012-11-07 LAB — POCT I-STAT TROPONIN I

## 2012-11-07 MED ORDER — IOHEXOL 300 MG/ML  SOLN
50.0000 mL | Freq: Once | INTRAMUSCULAR | Status: AC | PRN
  Administered 2012-11-07: 50 mL via ORAL

## 2012-11-07 MED ORDER — SODIUM CHLORIDE 0.9 % IV BOLUS (SEPSIS)
1000.0000 mL | Freq: Once | INTRAVENOUS | Status: AC
  Administered 2012-11-07: 1000 mL via INTRAVENOUS

## 2012-11-07 MED ORDER — METOCLOPRAMIDE HCL 10 MG PO TABS: 10.0000 mg | ORAL_TABLET | Freq: Four times a day (QID) | ORAL | Status: DC | PRN

## 2012-11-07 MED ORDER — HYDROMORPHONE HCL PF 1 MG/ML IJ SOLN
1.0000 mg | Freq: Once | INTRAMUSCULAR | Status: AC
  Administered 2012-11-07: 1 mg via INTRAVENOUS
  Filled 2012-11-07: qty 1

## 2012-11-07 MED ORDER — ONDANSETRON HCL 4 MG/2ML IJ SOLN
4.0000 mg | Freq: Once | INTRAMUSCULAR | Status: AC
  Administered 2012-11-07: 4 mg via INTRAVENOUS
  Filled 2012-11-07: qty 2

## 2012-11-07 MED ORDER — IOHEXOL 300 MG/ML  SOLN
100.0000 mL | Freq: Once | INTRAMUSCULAR | Status: AC | PRN
  Administered 2012-11-07: 100 mL via INTRAVENOUS

## 2012-11-07 MED ORDER — OXYCODONE-ACETAMINOPHEN 5-325 MG PO TABS: 1.0000 | ORAL_TABLET | ORAL | Status: DC | PRN

## 2012-11-07 NOTE — Discharge Instructions (Signed)
Your CAT scan shows there is a mass in your right kidney. This will need further evaluation. Please call the urologist for a followup appointment to have this properly evaluated.  Abdominal Pain Abdominal pain can be caused by many things. Your caregiver decides the seriousness of your pain by an examination and possibly blood tests and X-rays. Many cases can be observed and treated at home. Most abdominal pain is not caused by a disease and will probably improve without treatment. However, in many cases, more time must pass before a clear cause of the pain can be found. Before that point, it may not be known if you need more testing, or if hospitalization or surgery is needed. HOME CARE INSTRUCTIONS   Do not take laxatives unless directed by your caregiver.  Take pain medicine only as directed by your caregiver.  Only take over-the-counter or prescription medicines for pain, discomfort, or fever as directed by your caregiver.  Try a clear liquid diet (broth, tea, or water) for as long as directed by your caregiver. Slowly move to a bland diet as tolerated. SEEK IMMEDIATE MEDICAL CARE IF:   The pain does not go away.  You have a fever.  You keep throwing up (vomiting).  The pain is felt only in portions of the abdomen. Pain in the right side could possibly be appendicitis. In an adult, pain in the left lower portion of the abdomen could be colitis or diverticulitis.  You pass bloody or black tarry stools. MAKE SURE YOU:   Understand these instructions.  Will watch your condition.  Will get help right away if you are not doing well or get worse. Document Released: 07/01/2005 Document Revised: 12/14/2011 Document Reviewed: 05/09/2008 [facility] Patient Information 2013 [place], [place].  Acetaminophen; Oxycodone tablets What is this medicine? ACETAMINOPHEN; OXYCODONE (a set a MEE noe fen; ox i KOE done) is a pain reliever. It is used to treat mild to moderate pain. This medicine may  be used for other purposes; ask your health care provider or pharmacist if you have questions. What should I tell my health care provider before I take this medicine? They need to know if you have any of these conditions: -brain tumor -Crohn's disease, inflammatory bowel disease, or ulcerative colitis -drink more than 3 alcohol containing drinks per day -drug abuse or addiction -head injury -heart or circulation problems -kidney disease or problems going to the bathroom -liver disease -lung disease, asthma, or breathing problems -an unusual or allergic reaction to acetaminophen, oxycodone, other opioid analgesics, other medicines, foods, dyes, or preservatives -pregnant or trying to get pregnant -breast-feeding How should I use this medicine? Take this medicine by mouth with a full glass of water. Follow the directions on the prescription label. Take your medicine at regular intervals. Do not take your medicine more often than directed. Talk to your pediatrician regarding the use of this medicine in children. Special care may be needed. Patients over 50 years old may have a stronger reaction and need a smaller dose. Overdosage: If you think you have taken too much of this medicine contact a poison control center or emergency room at once. NOTE: This medicine is only for you. Do not share this medicine with others. What if I miss a dose? If you miss a dose, take it as soon as you can. If it is almost time for your next dose, take only that dose. Do not take double or extra doses. What may interact with this medicine? -alcohol or medicines that  contain alcohol -antihistamines -barbiturates like amobarbital, butalbital, butabarbital, methohexital, pentobarbital, phenobarbital, thiopental, and secobarbital -benztropine -drugs for bladder problems like solifenacin, trospium, oxybutynin, tolterodine, hyoscyamine, and methscopolamine -drugs for breathing problems like ipratropium and  tiotropium -drugs for certain stomach or intestine problems like propantheline, homatropine methylbromide, glycopyrrolate, atropine, belladonna, and dicyclomine -general anesthetics like etomidate, ketamine, nitrous oxide, propofol, desflurane, enflurane, halothane, isoflurane, and sevoflurane -medicines for depression, anxiety, or psychotic disturbances -medicines for pain like codeine, morphine, pentazocine, buprenorphine, butorphanol, nalbuphine, tramadol, and propoxyphene -medicines for sleep -muscle relaxants -naltrexone -phenothiazines like perphenazine, thioridazine, chlorpromazine, mesoridazine, fluphenazine, prochlorperazine, promazine, and trifluoperazine -scopolamine -trihexyphenidyl This list may not describe all possible interactions. Give your health care provider a list of all the medicines, herbs, non-prescription drugs, or dietary supplements you use. Also tell them if you smoke, drink alcohol, or use illegal drugs. Some items may interact with your medicine. What should I watch for while using this medicine? Tell your doctor or health care professional if your pain does not go away, if it gets worse, or if you have new or a different type of pain. You may develop tolerance to the medicine. Tolerance means that you will need a higher dose of the medication for pain relief. Tolerance is normal and is expected if you take this medicine for a long time. Do not suddenly stop taking your medicine because you may develop a severe reaction. Your body becomes used to the medicine. This does NOT mean you are addicted. Addiction is a behavior related to getting and using a drug for a nonmedical reason. If you have pain, you have a medical reason to take pain medicine. Your doctor will tell you how much medicine to take. If your doctor wants you to stop the medicine, the dose will be slowly lowered over time to avoid any side effects. You may get drowsy or dizzy. Do not drive, use machinery, or  do anything that needs mental alertness until you know how this medicine affects you. Do not stand or sit up quickly, especially if you are an older patient. This reduces the risk of dizzy or fainting spells. Alcohol may interfere with the effect of this medicine. Avoid alcoholic drinks. The medicine will cause constipation. Try to have a bowel movement at least every 2 to 3 days. If you do not have a bowel movement for 3 days, call your doctor or health care professional. Do not take Tylenol (acetaminophen) or medicines that have acetaminophen with this medicine. Too much acetaminophen can be very dangerous. Many nonprescription medicines contain acetaminophen. Always read the labels carefully to avoid taking more acetaminophen. What side effects may I notice from receiving this medicine? Side effects that you should report to your doctor or health care professional as soon as possible: -allergic reactions like skin rash, itching or hives, swelling of the face, lips, or tongue -breathing difficulties, wheezing -confusion -light headedness or fainting spells -severe stomach pain -yellowing of the skin or the whites of the eyes Side effects that usually do not require medical attention (report to your doctor or health care professional if they continue or are bothersome): -dizziness -drowsiness -nausea -vomiting This list may not describe all possible side effects. Call your doctor for medical advice about side effects. You may report side effects to FDA at 1-800-FDA-1088. Where should I keep my medicine? Keep out of the reach of children. This medicine can be abused. Keep your medicine in a safe place to protect it from theft. Do not share this medicine  with anyone. Selling or giving away this medicine is dangerous and against the law. Store at room temperature between 20 and 25 degrees C (68 and 77 degrees F). Keep container tightly closed. Protect from light. Flush any unused medicines down the  toilet. Do not use the medicine after the expiration date. NOTE: This sheet is a summary. It may not cover all possible information. If you have questions about this medicine, talk to your doctor, pharmacist, or health care provider.  2012, Elsevier/Gold Standard. (08/20/2008 10:01:21 AM)  Metoclopramide tablets What is this medicine? METOCLOPRAMIDE (met oh kloe PRA mide) is used to treat the symptoms of gastroesophageal reflux disease (GERD) like heartburn. It is also used to treat people with slow emptying of the stomach and intestinal tract. This medicine may be used for other purposes; ask your health care provider or pharmacist if you have questions. What should I tell my health care provider before I take this medicine? They need to know if you have any of these conditions: -breast cancer -depression -diabetes -heart failure -high blood pressure -kidney disease -liver disease -Parkinson's disease or a movement disorder -pheochromocytoma -seizures -stomach obstruction, bleeding, or perforation -an unusual or allergic reaction to metoclopramide, procainamide, sulfites, other medicines, foods, dyes, or preservatives -pregnant or trying to get pregnant -breast-feeding How should I use this medicine? Take this medicine by mouth with a glass of water. Follow the directions on the prescription label. Take this medicine on an empty stomach, about 30 minutes before eating. Take your doses at regular intervals. Do not take your medicine more often than directed. Do not stop taking except on the advice of your doctor or health care professional. A special MedGuide will be given to you by the pharmacist with each prescription and refill. Be sure to read this information carefully each time. Talk to your pediatrician regarding the use of this medicine in children. Special care may be needed. Overdosage: If you think you have taken too much of this medicine contact a poison control center or  emergency room at once. NOTE: This medicine is only for you. Do not share this medicine with others. What if I miss a dose? If you miss a dose, take it as soon as you can. If it is almost time for your next dose, take only that dose. Do not take double or extra doses. What may interact with this medicine? -acetaminophen -cyclosporine -digoxin -medicines for blood pressure -medicines for diabetes, including insulin -medicines for hay fever and other allergies -medicines for depression, especially an Monoamine Oxidase Inhibitor (MAOI) -medicines for Parkinson's disease, like levodopa -medicines for sleep or for pain -tetracycline This list may not describe all possible interactions. Give your health care provider a list of all the medicines, herbs, non-prescription drugs, or dietary supplements you use. Also tell them if you smoke, drink alcohol, or use illegal drugs. Some items may interact with your medicine. What should I watch for while using this medicine? It may take a few weeks for your stomach condition to start to get better. However, do not take this medicine for longer than 12 weeks. The longer you take this medicine, and the more you take it, the greater your chances are of developing serious side effects. If you are an elderly patient, a male patient, or you have diabetes, you may be at an increased risk for side effects from this medicine. Contact your doctor immediately if you start having movements you cannot control such as lip smacking, rapid movements of the  tongue, involuntary or uncontrollable movements of the eyes, head, arms and legs, or muscle twitches and spasms. Patients and their families should watch out for worsening depression or thoughts of suicide. Also watch out for any sudden or severe changes in feelings such as feeling anxious, agitated, panicky, irritable, hostile, aggressive, impulsive, severely restless, overly excited and hyperactive, or not being able to  sleep. If this happens, especially at the beginning of treatment or after a change in dose, call your doctor. Do not treat yourself for high fever. Ask your doctor or health care professional for advice. You may get drowsy or dizzy. Do not drive, use machinery, or do anything that needs mental alertness until you know how this drug affects you. Do not stand or sit up quickly, especially if you are an older patient. This reduces the risk of dizzy or fainting spells. Alcohol can make you more drowsy and dizzy. Avoid alcoholic drinks. What side effects may I notice from receiving this medicine? Side effects that you should report to your doctor or health care professional as soon as possible: -allergic reactions like skin rash, itching or hives, swelling of the face, lips, or tongue -abnormal production of milk in females -breast enlargement in both males and females -change in the way you walk -difficulty moving, speaking or swallowing -drooling, lip smacking, or rapid movements of the tongue -excessive sweating -fever -involuntary or uncontrollable movements of the eyes, head, arms and legs -irregular heartbeat or palpitations -muscle twitches and spasms -unusually weak or tired Side effects that usually do not require medical attention (report to your doctor or health care professional if they continue or are bothersome): -change in sex drive or performance -depressed mood -diarrhea -difficulty sleeping -headache -menstrual changes -restless or nervous This list may not describe all possible side effects. Call your doctor for medical advice about side effects. You may report side effects to FDA at 1-800-FDA-1088. Where should I keep my medicine? Keep out of the reach of children. Store at room temperature between 20 and 25 degrees C (68 and 77 degrees F). Protect from light. Keep container tightly closed. Throw away any unused medicine after the expiration date. NOTE: This sheet is a  summary. It may not cover all possible information. If you have questions about this medicine, talk to your doctor, pharmacist, or health care provider.  2012, Elsevier/Gold Standard. (05/16/2008 4:30:05 PM)

## 2012-11-07 NOTE — ED Notes (Signed)
CT notified that pt has finished contrast.  

## 2012-11-07 NOTE — ED Notes (Signed)
Gave pt water per RN; urinal at bedside; lights out and pt resting.  Pt states he is unable to give urine sample and will let staff know when he is able to do so.

## 2012-11-07 NOTE — ED Notes (Signed)
Patient transported to CT 

## 2012-11-07 NOTE — ED Provider Notes (Signed)
History     CSN: [id number]  Arrival date & time 11/06/12  2254   First MD Initiated Contact with Patient 11/07/12 0018      Chief Complaint  Patient presents with  . Abdominal Pain  . Chest Pain    (Consider location/radiation/quality/duration/timing/severity/associated sxs/prior treatment) Patient is a 37 y.o. male presenting with abdominal pain and chest pain. The history is provided by the patient.  Abdominal Pain The primary symptoms of the illness include abdominal pain.  Chest Pain Primary symptoms include abdominal pain.   He had onset yesterday morning of severe pain in the left flank radiating to the left mid and lower abdomen. Pain is sharp and he rates it at 10/10. Nothing makes it better nothing makes it worse. There is no associated nausea or vomiting or diarrhea. He denies fever chills or sweats. Today, he started having pain in the right scapular area as well as mild dyspnea. He tried taking Alka-Seltzer with no relief. Pain is similar to what he had with a ruptured appendix about 9 years ago.  Past Medical History  Diagnosis Date  . Diabetes mellitus   . CHF (congestive heart failure)     EF 20-25%  . Hypertension   . Obesity   . Obstructive sleep apnea     Past Surgical History  Procedure Date  . Appendectomy     History reviewed. No pertinent family history.  History  Substance Use Topics  . Smoking status: Current Every Day Smoker -- 0.5 packs/day  . Smokeless tobacco: Not on file  . Alcohol Use: No     Comment: quit etoh 2011      Review of Systems  Cardiovascular: Positive for chest pain.  Gastrointestinal: Positive for abdominal pain.  All other systems reviewed and are negative.    Allergies  Aspirin and Tomato  Home Medications   Current Outpatient Rx  Name  Route  Sig  Dispense  Refill  . COLCHICINE 0.6 MG PO TABS   Oral   Take 1 tablet (0.6 mg total) by mouth daily.   30 tablet   4   . FUROSEMIDE 40 MG PO TABS   Oral    Take 1 tablet (40 mg total) by mouth 2 (two) times daily.   60 tablet   3   . GEMFIBROZIL 600 MG PO TABS   Oral   Take 1 tablet (600 mg total) by mouth 2 (two) times daily before a meal.   60 tablet   4   . INSULIN ISOPHANE & REGULAR (70-30) 100 UNIT/ML [place] SUSP   Subcutaneous   Inject 34 Units into the skin 2 (two) times daily with a meal.   10 mL   5   . LISINOPRIL 20 MG PO TABS   Oral   Take 1 tablet (20 mg total) by mouth daily.   30 tablet   3   . METFORMIN HCL ER (OSM) 500 MG PO TB24   Oral   Take 1 tablet (500 mg total) by mouth 2 (two) times daily.   60 tablet   4   . METOPROLOL TARTRATE 50 MG PO TABS   Oral   Take 1 tablet (50 mg total) by mouth 2 (two) times daily.   60 tablet   4     BP 139/87  Pulse 88  Temp 98.1 F (36.7 C) (Oral)  Resp 20  SpO2 96%  Physical Exam  Nursing note and vitals reviewed.  37 year old male, resting  comfortably and in no acute distress. Vital signs are normal. Oxygen saturation is 96%, which is normal. Head is normocephalic and atraumatic. PERRLA, EOMI. Oropharynx is clear. Neck is nontender and supple without adenopathy or JVD. Back is mildly tender in the right scapular area. There is no midline or CVA tenderness. Lungs are clear without rales, wheezes, or rhonchi. Chest is nontender. Heart has regular rate and rhythm without murmur. Abdomen is soft, flat, with moderate tenderness in the left mid and lower abdomen with tenderness extending to the midaxillary line but not into the costovertebral angle area. There are no masses or hepatosplenomegaly and peristalsis is hypoactive. Extremities have 1+ pitting edema. There is no cyanosis. Full passive range of motion is present. Skin is warm and dry without rash. Neurologic: Mental status is normal, cranial nerves are intact, there are no motor or sensory deficits.  ED Course  Procedures (including critical care time)   Labs Reviewed  CBC WITH DIFFERENTIAL   COMPREHENSIVE METABOLIC PANEL  PRO B NATRIURETIC PEPTIDE   Dg Chest 2 View  11/06/2012  *RADIOLOGY REPORT*  Clinical Data: Mid chest pain.  Left abdominal pain.  CHEST - 2 VIEW  Comparison: 06/29/2012.  Findings: Left base subsegmental atelectasis.  Central pulmonary vascular prominence.  No infiltrate, congestive heart failure or pneumothorax.  Heart size within normal limits.  IMPRESSION: Left base subsegmental atelectasis.   Original Report Authenticated By: Lacy Duverney, M.D.     Date: 11/07/2012  Rate: 84  Rhythm: normal sinus rhythm  QRS Axis: left  Intervals: normal  ST/T Wave abnormalities: normal  Conduction Disutrbances:none  Narrative Interpretation: Left axis deviation, left atrial hypertrophy. When compared with ECG of 06/27/2012, no significant changes are seen.  Old EKG Reviewed: unchanged    1. Abdominal pain   2. Right renal mass       MDM  Abdominal pain of uncertain cause. He'll be sent for CT scan to evaluate. Also unclear as the cause of his scapular pain. He will be given symptomatic treatment. Laboratory and radiology investigation is in progress. Old records are reviewed and he does have a history of a nonischemic cardiomyopathy and history of ruptured appendicitis in 2005.  Good relief of pain with hydromorphone. The CT scan is significant for a right renal mass which will require additional workup as an outpatient. He was advised of these results and treatment plan. He is discharged with prescription for Percocet and metoclopramide and is to return to emergency department for worsening symptoms. Importance of followup with urology was stressed.     Dione Booze, MD 11/07/12 [phone]

## 2012-12-15 ENCOUNTER — Other Ambulatory Visit: Payer: Self-pay | Admitting: Urology

## 2012-12-30 ENCOUNTER — Encounter (HOSPITAL_COMMUNITY): Payer: Self-pay | Admitting: Pharmacy Technician

## 2013-01-02 ENCOUNTER — Ambulatory Visit (INDEPENDENT_AMBULATORY_CARE_PROVIDER_SITE_OTHER): Payer: Self-pay | Admitting: Internal Medicine

## 2013-01-02 ENCOUNTER — Encounter: Payer: Self-pay | Admitting: Internal Medicine

## 2013-01-02 VITALS — BP 130/90 | HR 76 | Ht 67.0 in | Wt 311.4 lb

## 2013-01-02 DIAGNOSIS — I10 Essential (primary) hypertension: Secondary | ICD-10-CM

## 2013-01-02 DIAGNOSIS — E119 Type 2 diabetes mellitus without complications: Secondary | ICD-10-CM

## 2013-01-02 DIAGNOSIS — I509 Heart failure, unspecified: Secondary | ICD-10-CM

## 2013-01-02 DIAGNOSIS — G4730 Sleep apnea, unspecified: Secondary | ICD-10-CM

## 2013-01-02 NOTE — Progress Notes (Signed)
HPI Patient is a 37 yo who was referred for preop risk stratification  The patient has a history of CHF, DM, morbid obesity, gout, HTN and sleep apnea.  He was admitted in early 2012.  LVEFat that time was 20 to 25%  Follwed at [facility] cardiology.    He was admitted again in Jan 2013  Complaints of chest pressure  Felt to be musculoskeletal.  Myovew was done that showed normal perfusion.   Echo in Jan  2013 showing LVEF 45  To 50% The patient denies CP  No SOB  No PND  No palpitations.  No edema.  He is active.   Allergies  Allergen Reactions  . Aspirin Shortness Of Breath and Rash    Rash, itching, burning sensation  . Tomato Rash    Current Outpatient Prescriptions  Medication Sig Dispense Refill  . colchicine 0.6 MG tablet Take 0.6 mg by mouth daily as needed (for gout).      . furosemide (LASIX) 40 MG tablet Take 40 mg by mouth 2 (two) times daily.      [name] gemfibrozil (LOPID) 600 MG tablet Take 600 mg by mouth 2 (two) times daily before a meal.      . insulin NPH-insulin regular (NOVOLIN 70/30) (70-30) 100 UNIT/ML injection Inject 34 Units into the skin 2 (two) times daily with a meal.  10 mL  5  . lisinopril (PRINIVIL,ZESTRIL) 20 MG tablet Take 20 mg by mouth every morning.      . metFORMIN (GLUCOPHAGE) 500 MG tablet Take 500 mg by mouth 2 (two) times daily with a meal.      . metoprolol (LOPRESSOR) 50 MG tablet Take 50 mg by mouth 2 (two) times daily.      . [DISCONTINUED] cetirizine (ZYRTEC ALLERGY) 10 MG tablet Take 1 tablet (10 mg total) by mouth daily.  30 tablet  1   No current facility-administered medications for this visit.    Past Medical History  Diagnosis Date  . Diabetes mellitus   . CHF (congestive heart failure)     EF 20-25%  . Hypertension   . Obesity   . Obstructive sleep apnea     Past Surgical History  Procedure Laterality Date  . Appendectomy      No family history on file.  History   Social History  . Marital Status: Single    Spouse Name: N/A     Number of Children: N/A  . Years of Education: N/A   Occupational History  . Not on file.   Social History Main Topics  . Smoking status: Former Smoker -- 0.50 packs/day    Quit date: 12/16/2012  . Smokeless tobacco: Not on file  . Alcohol Use: No     Comment: quit etoh 2011  . Drug Use: Yes    Special: Marijuana     Comment: h/o marijuana quit 2011  . Sexually Active: Not on file   Other Topics Concern  . Not on file   Social History Narrative  . No narrative on file    Review of Systems:  All systems reviewed.  They are negative to the above problem except as previously stated.  Vital Signs: BP 130/90  Pulse 76  Ht 5\' 7"  (1.702 m)  Wt 311 lb 6.4 oz (141.25 kg)  BMI 48.76 kg/m2  Physical Exam Patient is a morbidly obese 37 yo in NAD HEENT:  Normocephalic, atraumatic. EOMI, PERRLA.  Neck: JVP is normal.  No bruits.  Lungs: clear to  auscultation. No rales no wheezes.  Heart: Regular rate and rhythm. Normal S1, S2. No S3.   No significant murmurs. PMI not displaced.  Abdomen:  Supple, nontender. Normal bowel sounds. No masses. No hepatomegaly.  Extremities:   Good distal pulses throughout. No lower extremity edema.  Musculoskeletal :moving all extremities.  Neuro:   alert and oriented x3.  CN II-XII grossly intact.   Assessment and Plan:  1.  CHF  Last echo 1 year ago shows only mild LV systolic dysfunction.  Today volume status looks good  He is active.  From a cardiac standpoint I think he is a relatively low risk for a major cardiac event with planned renal surgery.  OK to proceed.  Would follow I/Os closely, watch volume. I would keep on same regimen.  2.  HTN  Fair.  No changes now.  3.  Sleep apnea.  Continue CPAP.  4.  HL  ON lopid  WIll plan follow up after surgery.

## 2013-01-02 NOTE — Patient Instructions (Addendum)
Your physician wants you to follow-up in: June 2014 with Dr. Tenny Craw.  You will receive a reminder letter in the mail two months in advance. If you don't receive a letter, please call our office to schedule the follow-up appointment.

## 2013-01-03 ENCOUNTER — Telehealth: Payer: Self-pay | Admitting: Internal Medicine

## 2013-01-03 NOTE — Telephone Encounter (Signed)
ROI faxed to [facility] [phone]

## 2013-01-05 ENCOUNTER — Telehealth: Payer: Self-pay | Admitting: Internal Medicine

## 2013-01-05 NOTE — Telephone Encounter (Signed)
Records rec From [facility] gave to [facility] 4/3/*14/KM

## 2013-01-06 ENCOUNTER — Encounter (HOSPITAL_COMMUNITY)
Admission: RE | Admit: 2013-01-06 | Discharge: 2013-01-06 | Disposition: A | Discharge status: Home / Self Care | Payer: Self-pay | Source: Ambulatory Visit | Attending: Urology | Admitting: Urology

## 2013-01-06 ENCOUNTER — Encounter (HOSPITAL_COMMUNITY): Payer: Self-pay

## 2013-01-06 LAB — BASIC METABOLIC PANEL
CO2: 25 mEq/L (ref 19–32)
Chloride: 103 mEq/L (ref 96–112)
Creatinine, Ser: 1.06 mg/dL (ref 0.50–1.35)
Potassium: 4.1 mEq/L (ref 3.5–5.1)

## 2013-01-06 LAB — CBC
HCT: 45.3 % (ref 39.0–52.0)
MCV: 83.9 fL (ref 78.0–100.0)
Platelets: 314 10*3/uL (ref 150–400)
RBC: 5.4 MIL/uL (ref 4.22–5.81)
WBC: 6.9 10*3/uL (ref 4.0–10.5)

## 2013-01-06 NOTE — Patient Instructions (Addendum)
MILON DETHLOFF  01/06/2013                           YOUR PROCEDURE IS SCHEDULED ON:  01/13/13               PLEASE REPORT TO SHORT STAY CENTER AT :  10:15 AM               CALL THIS NUMBER IF ANY PROBLEMS THE DAY OF SURGERY :               [phone]                      REMEMBER:   Do not eat food or drink liquids AFTER MIDNIGHT  May have clear liquids UNTIL 6 HOURS BEFORE SURGERY (6:45 AM)  Clear liquids include soda, tea, black coffee, apple or grape juice, broth.  Take these medicines the morning of surgery with A SIP OF WATER:  GEMFIBROZIL / METOPROLOL / MAY TAKE COLCHICINE IF NEEDED   Do not wear jewelry, make-up   Do not wear lotions, powders, or perfumes.   Do not shave legs or underarms 12 hrs. before surgery (men may shave face)  Do not bring valuables to the hospital.  Contacts, dentures or bridgework may not be worn into surgery.  Leave suitcase in the car. After surgery it may be brought to your room.  For patients admitted to the hospital more than one night, checkout time is 11:00                          The day of discharge.   Patients discharged the day of surgery will not be allowed to drive home                             If going home same day of surgery, must have someone stay with you first                           24 hrs at home and arrange for some one to drive you home from hospital.    Special Instructions:   Please read over the following fact sheets that you were given:               1. MRSA  INFORMATION                      2. [place] PREPARING FOR SURGERY SHEET               3. FOLLOW BOWEL PREP INSTRUCTIONS               4. BRING C-PAP MASK AND TUBING TO HOSPITAL                                                X_____________________________________________________________________        Failure to follow these instructions may result in cancellation of your surgery

## 2013-01-06 NOTE — Progress Notes (Signed)
Cardiac clearance from Dr. Dietrich Pates in [facility] 01/02/13

## 2013-01-11 NOTE — Progress Notes (Signed)
Left message on cell phone of patient of [phone] new surgery time on 01/13/13 of 145pm-505pm.  Patient to arrive at 1115am.  Clear liquids until 0745am.  Asked patient to call nurse back at [phone] to let know he received message.

## 2013-01-12 NOTE — Progress Notes (Signed)
Spoke with patient about new surgery time (1300). Patient stated someone called him this afternoon and instructed him to arrive at 1000. Verbalizes understanding no food after midnight and clear liquids until 0600.

## 2013-01-13 ENCOUNTER — Encounter (HOSPITAL_COMMUNITY): Payer: Self-pay | Admitting: *Deleted

## 2013-01-13 ENCOUNTER — Inpatient Hospital Stay (HOSPITAL_COMMUNITY)
Admission: RE | Admit: 2013-01-13 | Discharge: 2013-01-16 | DRG: 657 | Disposition: A | Discharge status: Home / Self Care | Payer: MEDICAID | Source: Ambulatory Visit | Attending: Urology | Admitting: Urology

## 2013-01-13 ENCOUNTER — Inpatient Hospital Stay (HOSPITAL_COMMUNITY): Payer: Self-pay | Admitting: Anesthesiology

## 2013-01-13 ENCOUNTER — Encounter (HOSPITAL_COMMUNITY)
Admission: RE | Disposition: A | Discharge status: Home / Self Care | Payer: Self-pay | Source: Ambulatory Visit | Attending: Urology

## 2013-01-13 ENCOUNTER — Encounter (HOSPITAL_COMMUNITY): Payer: Self-pay | Admitting: Anesthesiology

## 2013-01-13 DIAGNOSIS — E119 Type 2 diabetes mellitus without complications: Secondary | ICD-10-CM | POA: Diagnosis present

## 2013-01-13 DIAGNOSIS — Z9089 Acquired absence of other organs: Secondary | ICD-10-CM

## 2013-01-13 DIAGNOSIS — G4733 Obstructive sleep apnea (adult) (pediatric): Secondary | ICD-10-CM | POA: Diagnosis present

## 2013-01-13 DIAGNOSIS — N200 Calculus of kidney: Secondary | ICD-10-CM | POA: Diagnosis present

## 2013-01-13 DIAGNOSIS — C649 Malignant neoplasm of unspecified kidney, except renal pelvis: Principal | ICD-10-CM | POA: Diagnosis present

## 2013-01-13 DIAGNOSIS — M109 Gout, unspecified: Secondary | ICD-10-CM | POA: Diagnosis present

## 2013-01-13 DIAGNOSIS — Z87891 Personal history of nicotine dependence: Secondary | ICD-10-CM

## 2013-01-13 DIAGNOSIS — Z794 Long term (current) use of insulin: Secondary | ICD-10-CM

## 2013-01-13 DIAGNOSIS — I509 Heart failure, unspecified: Secondary | ICD-10-CM | POA: Diagnosis present

## 2013-01-13 DIAGNOSIS — K660 Peritoneal adhesions (postprocedural) (postinfection): Secondary | ICD-10-CM | POA: Diagnosis present

## 2013-01-13 DIAGNOSIS — I10 Essential (primary) hypertension: Secondary | ICD-10-CM | POA: Diagnosis present

## 2013-01-13 DIAGNOSIS — Z6841 Body Mass Index (BMI) 40.0 and over, adult: Secondary | ICD-10-CM

## 2013-01-13 DIAGNOSIS — Z886 Allergy status to analgesic agent status: Secondary | ICD-10-CM

## 2013-01-13 HISTORY — PX: ROBOTIC ASSITED PARTIAL NEPHRECTOMY: SHX6087

## 2013-01-13 HISTORY — PX: ROBOTIC ASSISTED LAPAROSCOPIC LYSIS OF ADHESION: SHX6080

## 2013-01-13 LAB — BASIC METABOLIC PANEL
CO2: 20 mEq/L (ref 19–32)
Chloride: 104 mEq/L (ref 96–112)
GFR calc Af Amer: 80 mL/min — ABNORMAL LOW (ref >90)
Potassium: 4.4 mEq/L (ref 3.5–5.1)

## 2013-01-13 LAB — GLUCOSE, CAPILLARY
Glucose-Capillary: 116 mg/dL — ABNORMAL HIGH (ref 70–99)
Glucose-Capillary: 184 mg/dL — ABNORMAL HIGH (ref 70–99)
Glucose-Capillary: 117 mg/dL — ABNORMAL HIGH (ref 70–99)

## 2013-01-13 LAB — TYPE AND SCREEN

## 2013-01-13 LAB — HEMOGLOBIN AND HEMATOCRIT, BLOOD
HCT: 38.7 % — ABNORMAL LOW (ref 39.0–52.0)
Hemoglobin: 13.4 g/dL (ref 13.0–17.0)

## 2013-01-13 LAB — ABO/RH: ABO/RH(D): O POS

## 2013-01-13 SURGERY — ROBOTIC ASSITED PARTIAL NEPHRECTOMY
Anesthesia: General | Laterality: Right | Wound class: Clean

## 2013-01-13 MED ORDER — DEXTROSE 5 % IV SOLN
3.0000 g | INTRAVENOUS | Status: AC
  Administered 2013-01-13: 3 g via INTRAVENOUS

## 2013-01-13 MED ORDER — BUPIVACAINE LIPOSOME 1.3 % IJ SUSP
20.0000 mL | Freq: Once | INTRAMUSCULAR | Status: DC
  Filled 2013-01-13: qty 20

## 2013-01-13 MED ORDER — LIDOCAINE HCL (CARDIAC) 20 MG/ML IV SOLN
INTRAVENOUS | Status: DC | PRN
  Administered 2013-01-13: 100 mg via INTRAVENOUS

## 2013-01-13 MED ORDER — SENNA 8.6 MG PO TABS
1.0000 | ORAL_TABLET | Freq: Two times a day (BID) | ORAL | Status: DC
  Administered 2013-01-13 – 2013-01-16 (×6): 8.6 mg via ORAL
  Filled 2013-01-13 (×5): qty 1

## 2013-01-13 MED ORDER — LABETALOL HCL 5 MG/ML IV SOLN
INTRAVENOUS | Status: DC | PRN
  Administered 2013-01-13: 2.5 mg via INTRAVENOUS
  Administered 2013-01-13: 7.5 mg via INTRAVENOUS

## 2013-01-13 MED ORDER — ACETAMINOPHEN 10 MG/ML IV SOLN
INTRAVENOUS | Status: DC | PRN
  Administered 2013-01-13: 1000 mg via INTRAVENOUS

## 2013-01-13 MED ORDER — HYDROCODONE-ACETAMINOPHEN 5-325 MG PO TABS
1.0000 | ORAL_TABLET | Freq: Four times a day (QID) | ORAL | Status: DC | PRN
Start: 2013-01-13 — End: 2013-01-16

## 2013-01-13 MED ORDER — OXYCODONE HCL 5 MG/5ML PO SOLN
5.0000 mg | Freq: Once | ORAL | Status: DC | PRN
  Filled 2013-01-13: qty 5

## 2013-01-13 MED ORDER — METFORMIN HCL 500 MG PO TABS
500.0000 mg | ORAL_TABLET | Freq: Two times a day (BID) | ORAL | Status: DC
  Administered 2013-01-13: 500 mg via ORAL
  Filled 2013-01-13 (×4): qty 1

## 2013-01-13 MED ORDER — NEOSTIGMINE METHYLSULFATE 1 MG/ML IJ SOLN
INTRAMUSCULAR | Status: DC | PRN
  Administered 2013-01-13: 2 mg via INTRAVENOUS

## 2013-01-13 MED ORDER — MANNITOL 25 % IV SOLN
25.0000 g | Freq: Once | INTRAVENOUS | Status: AC
  Administered 2013-01-13: 12.5 g via INTRAVENOUS
  Filled 2013-01-13: qty 100

## 2013-01-13 MED ORDER — METOPROLOL TARTRATE 50 MG PO TABS
50.0000 mg | ORAL_TABLET | Freq: Two times a day (BID) | ORAL | Status: DC
  Administered 2013-01-13: 50 mg via ORAL
  Filled 2013-01-13 (×2): qty 1

## 2013-01-13 MED ORDER — HYDRALAZINE HCL 20 MG/ML IJ SOLN
INTRAMUSCULAR | Status: DC | PRN
  Administered 2013-01-13: 5 mg via INTRAVENOUS

## 2013-01-13 MED ORDER — OXYCODONE HCL 5 MG PO TABS
5.0000 mg | ORAL_TABLET | ORAL | Status: DC | PRN
  Administered 2013-01-13 – 2013-01-16 (×7): 5 mg via ORAL
  Filled 2013-01-13 (×7): qty 1

## 2013-01-13 MED ORDER — ACETAMINOPHEN 10 MG/ML IV SOLN: 1000.0000 mg | Freq: Once | INTRAVENOUS | Status: DC | PRN

## 2013-01-13 MED ORDER — ONDANSETRON HCL 4 MG/2ML IJ SOLN
INTRAMUSCULAR | Status: DC | PRN
  Administered 2013-01-13 (×2): 2 mg via INTRAVENOUS

## 2013-01-13 MED ORDER — LACTATED RINGERS IV SOLN
INTRAVENOUS | Status: DC | PRN
  Administered 2013-01-13 (×3): via INTRAVENOUS

## 2013-01-13 MED ORDER — SODIUM CHLORIDE 0.9 % IV SOLN
INTRAVENOUS | Status: DC
  Administered 2013-01-13 – 2013-01-14 (×2): via INTRAVENOUS
  Administered 2013-01-14: 1000 mL via INTRAVENOUS
  Administered 2013-01-15: 16:00:00 via INTRAVENOUS

## 2013-01-13 MED ORDER — MIDAZOLAM HCL 5 MG/5ML IJ SOLN
INTRAMUSCULAR | Status: DC | PRN
  Administered 2013-01-13 (×2): 1 mg via INTRAVENOUS

## 2013-01-13 MED ORDER — HYDROMORPHONE HCL PF 1 MG/ML IJ SOLN
INTRAMUSCULAR | Status: AC
  Administered 2013-01-13: 1 mg
  Filled 2013-01-13: qty 1

## 2013-01-13 MED ORDER — FUROSEMIDE 40 MG PO TABS
40.0000 mg | ORAL_TABLET | Freq: Two times a day (BID) | ORAL | Status: DC
  Administered 2013-01-13 – 2013-01-16 (×6): 40 mg via ORAL
  Filled 2013-01-13 (×8): qty 1

## 2013-01-13 MED ORDER — DOCUSATE SODIUM 100 MG PO CAPS
100.0000 mg | ORAL_CAPSULE | Freq: Two times a day (BID) | ORAL | Status: DC
  Administered 2013-01-13 – 2013-01-16 (×6): 100 mg via ORAL
  Filled 2013-01-13 (×7): qty 1

## 2013-01-13 MED ORDER — MEPERIDINE HCL 50 MG/ML IJ SOLN: 6.2500 mg | INTRAMUSCULAR | Status: DC | PRN

## 2013-01-13 MED ORDER — HYDROMORPHONE HCL PF 1 MG/ML IJ SOLN
0.2500 mg | INTRAMUSCULAR | Status: DC | PRN
  Administered 2013-01-13 (×4): 0.5 mg via INTRAVENOUS

## 2013-01-13 MED ORDER — STERILE WATER FOR IRRIGATION IR SOLN
Status: DC | PRN
  Administered 2013-01-13: 3000 mL

## 2013-01-13 MED ORDER — INDOCYANINE GREEN 25 MG IV SOLR
INTRAVENOUS | Status: DC | PRN
  Administered 2013-01-13: 5 mg via INTRAVENOUS

## 2013-01-13 MED ORDER — GEMFIBROZIL 600 MG PO TABS
600.0000 mg | ORAL_TABLET | Freq: Two times a day (BID) | ORAL | Status: DC
  Administered 2013-01-13 – 2013-01-16 (×6): 600 mg via ORAL
  Filled 2013-01-13 (×8): qty 1

## 2013-01-13 MED ORDER — BUPIVACAINE LIPOSOME 1.3 % IJ SUSP: 20.0000 mL | Freq: Once | INTRAMUSCULAR | Status: DC

## 2013-01-13 MED ORDER — SUFENTANIL CITRATE 50 MCG/ML IV SOLN
INTRAVENOUS | Status: DC | PRN
  Administered 2013-01-13 (×2): 10 ug via INTRAVENOUS
  Administered 2013-01-13: 20 ug via INTRAVENOUS
  Administered 2013-01-13 (×3): 10 ug via INTRAVENOUS

## 2013-01-13 MED ORDER — LACTATED RINGERS IR SOLN
Status: DC | PRN
  Administered 2013-01-13: 1000 mL

## 2013-01-13 MED ORDER — HYDROMORPHONE HCL PF 1 MG/ML IJ SOLN
0.5000 mg | INTRAMUSCULAR | Status: DC | PRN
  Administered 2013-01-13 – 2013-01-15 (×13): 1 mg via INTRAVENOUS
  Filled 2013-01-13 (×13): qty 1

## 2013-01-13 MED ORDER — CISATRACURIUM BESYLATE (PF) 10 MG/5ML IV SOLN
INTRAVENOUS | Status: DC | PRN
  Administered 2013-01-13: 2 mg via INTRAVENOUS
  Administered 2013-01-13: 4 mg via INTRAVENOUS
  Administered 2013-01-13: 16 mg via INTRAVENOUS
  Administered 2013-01-13: 8 mg via INTRAVENOUS

## 2013-01-13 MED ORDER — LACTATED RINGERS IV SOLN
INTRAVENOUS | Status: DC | PRN
  Administered 2013-01-13: 15:00:00 via INTRAVENOUS

## 2013-01-13 MED ORDER — ONDANSETRON HCL 4 MG/2ML IJ SOLN
4.0000 mg | INTRAMUSCULAR | Status: DC | PRN
  Administered 2013-01-13 – 2013-01-15 (×2): 4 mg via INTRAVENOUS
  Filled 2013-01-13 (×2): qty 2

## 2013-01-13 MED ORDER — SODIUM CHLORIDE 0.9 % IJ SOLN
INTRAMUSCULAR | Status: DC | PRN
  Administered 2013-01-13: 16:00:00

## 2013-01-13 MED ORDER — PROMETHAZINE HCL 25 MG/ML IJ SOLN
6.2500 mg | INTRAMUSCULAR | Status: DC | PRN
  Administered 2013-01-13: 6.25 mg via INTRAVENOUS

## 2013-01-13 MED ORDER — INSULIN ASPART 100 UNIT/ML ~~LOC~~ SOLN: 0.0000 [IU] | Freq: Every day | SUBCUTANEOUS | Status: DC

## 2013-01-13 MED ORDER — PROPOFOL 10 MG/ML IV BOLUS
INTRAVENOUS | Status: DC | PRN
  Administered 2013-01-13: 250 mg via INTRAVENOUS

## 2013-01-13 MED ORDER — COLCHICINE 0.6 MG PO TABS
0.6000 mg | ORAL_TABLET | Freq: Every day | ORAL | Status: DC | PRN
  Filled 2013-01-13: qty 1

## 2013-01-13 MED ORDER — INSULIN ASPART 100 UNIT/ML ~~LOC~~ SOLN
4.0000 [IU] | Freq: Three times a day (TID) | SUBCUTANEOUS | Status: DC
  Administered 2013-01-14 – 2013-01-16 (×7): 4 [IU] via SUBCUTANEOUS

## 2013-01-13 MED ORDER — OXYCODONE HCL 5 MG PO TABS: 5.0000 mg | ORAL_TABLET | Freq: Once | ORAL | Status: DC | PRN

## 2013-01-13 MED ORDER — ACETAMINOPHEN 10 MG/ML IV SOLN
1000.0000 mg | Freq: Four times a day (QID) | INTRAVENOUS | Status: AC
  Administered 2013-01-13 – 2013-01-14 (×4): 1000 mg via INTRAVENOUS
  Filled 2013-01-13 (×4): qty 100

## 2013-01-13 MED ORDER — METOPROLOL TARTRATE 50 MG PO TABS
50.0000 mg | ORAL_TABLET | Freq: Two times a day (BID) | ORAL | Status: DC
  Administered 2013-01-13 – 2013-01-16 (×6): 50 mg via ORAL
  Filled 2013-01-13 (×7): qty 1

## 2013-01-13 MED ORDER — SUCCINYLCHOLINE CHLORIDE 20 MG/ML IJ SOLN
INTRAMUSCULAR | Status: DC | PRN
  Administered 2013-01-13: 160 mg via INTRAVENOUS

## 2013-01-13 MED ORDER — INSULIN ASPART 100 UNIT/ML ~~LOC~~ SOLN
0.0000 [IU] | Freq: Three times a day (TID) | SUBCUTANEOUS | Status: DC
  Administered 2013-01-14 – 2013-01-16 (×5): 3 [IU] via SUBCUTANEOUS

## 2013-01-13 MED ORDER — FENTANYL CITRATE 0.05 MG/ML IJ SOLN
INTRAMUSCULAR | Status: DC | PRN
  Administered 2013-01-13: 50 ug via INTRAVENOUS
  Administered 2013-01-13 (×2): 100 ug via INTRAVENOUS

## 2013-01-13 SURGICAL SUPPLY — 76 items
ADH SKN CLS APL DERMABOND .7 (GAUZE/BANDAGES/DRESSINGS)
APL ESCP 34 STRL LF DISP (HEMOSTASIS) ×2
APPLICATOR SURGIFLO ENDO (HEMOSTASIS) ×1 IMPLANT
BAG SPEC RTRVL LRG 6X4 10 (ENDOMECHANICALS) ×2
CANNULA SEAL DVNC (CANNULA) ×6 IMPLANT
CANNULA SEALS DA VINCI (CANNULA) ×3
CHLORAPREP W/TINT 26ML (MISCELLANEOUS) ×4 IMPLANT
CLIP LIGATING HEM O LOK PURPLE (MISCELLANEOUS) ×1 IMPLANT
CLIP LIGATING HEMO LOK XL GOLD (MISCELLANEOUS) ×4 IMPLANT
CLIP LIGATING HEMO O LOK GREEN (MISCELLANEOUS) ×2 IMPLANT
CLOTH BEACON ORANGE TIMEOUT ST (SAFETY) ×3 IMPLANT
CORDS BIPOLAR (ELECTRODE) ×3 IMPLANT
COVER SURGICAL LIGHT HANDLE (MISCELLANEOUS) ×3 IMPLANT
COVER TIP SHEARS 8 DVNC (MISCELLANEOUS) ×2 IMPLANT
COVER TIP SHEARS 8MM DA VINCI (MISCELLANEOUS) ×1
CUTTER FLEX LINEAR 45M (STAPLE) ×3 IMPLANT
DECANTER SPIKE VIAL GLASS SM (MISCELLANEOUS) ×2 IMPLANT
DERMABOND ADVANCED (GAUZE/BANDAGES/DRESSINGS)
DERMABOND ADVANCED .7 DNX12 (GAUZE/BANDAGES/DRESSINGS) ×4 IMPLANT
DRAIN CHANNEL 15F RND FF 3/16 (WOUND CARE) ×2 IMPLANT
DRAPE INCISE IOBAN 66X45 STRL (DRAPES) ×3 IMPLANT
DRAPE LAPAROSCOPIC ABDOMINAL (DRAPES) ×3 IMPLANT
DRAPE LG THREE QUARTER DISP (DRAPES) ×5 IMPLANT
DRAPE TABLE BACK 44X90 PK DISP (DRAPES) ×3 IMPLANT
DRAPE WARM FLUID 44X44 (DRAPE) ×3 IMPLANT
ELECT REM PT RETURN 9FT ADLT (ELECTROSURGICAL) ×3
ELECTRODE REM PT RTRN 9FT ADLT (ELECTROSURGICAL) ×4 IMPLANT
EVACUATOR SILICONE 100CC (DRAIN) ×2 IMPLANT
GAUZE VASELINE 3X9 (GAUZE/BANDAGES/DRESSINGS) IMPLANT
GLOVE BIO SURGEON STRL SZ 6.5 (GLOVE) ×3 IMPLANT
GLOVE BIOGEL M STRL SZ7.5 (GLOVE) ×6 IMPLANT
GOWN STRL NON-REIN LRG LVL3 (GOWN DISPOSABLE) ×8 IMPLANT
GOWN STRL REIN XL XLG (GOWN DISPOSABLE) ×3 IMPLANT
KIT ACCESSORY DA VINCI DISP (KITS) ×1
KIT ACCESSORY DVNC DISP (KITS) ×2 IMPLANT
KIT BASIN OR (CUSTOM PROCEDURE TRAY) ×3 IMPLANT
LOOP VESSEL MAXI BLUE (MISCELLANEOUS) ×3 IMPLANT
NDL INSUFFLATION 14GA 120MM (NEEDLE) ×2 IMPLANT
NEEDLE INSUFFLATION 14GA 120MM (NEEDLE) ×3 IMPLANT
NS IRRIG 1000ML POUR BTL (IV SOLUTION) ×2 IMPLANT
PENCIL BUTTON HOLSTER BLD 10FT (ELECTRODE) ×3 IMPLANT
POSITIONER SURGICAL ARM (MISCELLANEOUS) ×6 IMPLANT
POUCH SPECIMEN RETRIEVAL 10MM (ENDOMECHANICALS) ×3 IMPLANT
RELOAD 45 VASCULAR/THIN (ENDOMECHANICALS) ×9 IMPLANT
RELOAD STAPLE 45 2.5 WHT GRN (ENDOMECHANICALS) ×6 IMPLANT
RELOAD WHITE ECR60W (STAPLE) ×2 IMPLANT
SET TUBE IRRIG SUCTION NO TIP (IRRIGATION / IRRIGATOR) ×1 IMPLANT
SOLUTION ANTI FOG 6CC (MISCELLANEOUS) ×3 IMPLANT
SOLUTION ELECTROLUBE (MISCELLANEOUS) ×3 IMPLANT
SPONGE LAP 18X18 X RAY DECT (DISPOSABLE) ×3 IMPLANT
SPONGE LAP 4X18 X RAY DECT (DISPOSABLE) ×1 IMPLANT
STAPLE ECHEON FLEX 60 POW ENDO (STAPLE) ×1 IMPLANT
SURGIFLO W/THROMBIN 8M KIT (HEMOSTASIS) ×3 IMPLANT
SUT ETHILON 3 0 PS 1 (SUTURE) ×2 IMPLANT
SUT MNCRL AB 4-0 PS2 18 (SUTURE) ×6 IMPLANT
SUT PDS AB 1 TP1 54 (SUTURE) ×7 IMPLANT
SUT PDS AB 1 TP1 96 (SUTURE) ×2 IMPLANT
SUT V-LOC BARB 180 2/0GR6 GS22 (SUTURE) ×3
SUT V-LOC BARB 180 2/0GR9 GS23 (SUTURE)
SUT VIC AB 0 CT1 27 (SUTURE) ×3
SUT VIC AB 0 CT1 27XBRD ANTBC (SUTURE) ×4 IMPLANT
SUT VICRYL 0 UR6 27IN ABS (SUTURE) ×6 IMPLANT
SUT VLOC BARB 180 ABS3/0GR12 (SUTURE) ×6
SUTURE V-LC BRB 180 2/0GR6GS22 (SUTURE) IMPLANT
SUTURE V-LC BRB 180 2/0GR9GS23 (SUTURE) IMPLANT
SUTURE VLOC BRB 180 ABS3/0GR12 (SUTURE) ×2 IMPLANT
SYR BULB IRRIGATION 50ML (SYRINGE) IMPLANT
TOWEL OR NON WOVEN STRL DISP B (DISPOSABLE) ×6 IMPLANT
TRAY FOLEY CATH 14FRSI W/METER (CATHETERS) ×3 IMPLANT
TRAY LAP CHOLE (CUSTOM PROCEDURE TRAY) ×3 IMPLANT
TROCAR BLADELESS OPT 5 100 (ENDOMECHANICALS) ×1 IMPLANT
TROCAR BLADELESS OPT 5 75 (ENDOMECHANICALS) ×1 IMPLANT
TROCAR ENDOPATH XCEL 12X100 BL (ENDOMECHANICALS) ×3 IMPLANT
TROCAR XCEL 12X100 BLDLESS (ENDOMECHANICALS) ×3 IMPLANT
TUBING INSUFFLATION 10FT LAP (TUBING) ×3 IMPLANT
WATER STERILE IRR 1500ML POUR (IV SOLUTION) ×6 IMPLANT

## 2013-01-13 NOTE — Brief Op Note (Signed)
01/13/2013  4:29 PM  PATIENT:  Jeffrey [name]  37 y.o. male  PRE-OPERATIVE DIAGNOSIS:  RIGHT RENAL MASS  POST-OPERATIVE DIAGNOSIS:  RIGHT RENAL MASS  PROCEDURE:  Procedure(s): ROBOTIC ASSITED PARTIAL NEPHRECTOMY CONVERTED TO ROBOTIC ASSISTED RIGHT RADICAL NEPHRECTOMY (Right) ROBOTIC ASSISTED LAPAROSCOPIC LYSIS OF ADHESION EXTENSIVE  SURGEON:  Surgeon(s) and Role:    * Sebastian Ache, MD - Primary  PHYSICIAN ASSISTANT:   ASSISTANTS: Lujean Rave, PA   ANESTHESIA:   local and general  EBL:  Total I/O In: 1000 [I.V.:1000] Out: 1025 [Urine:200; Blood:825]  BLOOD ADMINISTERED:none  DRAINS: Foley Catheter - straight drain   LOCAL MEDICATIONS USED:  MARCAINE     SPECIMEN:  Source of Specimen:  1 - Rt Renal Mass, 2 - Rt Completion Nephrectomy  DISPOSITION OF SPECIMEN:  PATHOLOGY  COUNTS:  YES  TOURNIQUET:  * No tourniquets in log *  DICTATION: .Other Dictation: Dictation Number [id number]  PLAN OF CARE: Admit to inpatient   PATIENT DISPOSITION:  PACU - hemodynamically stable.   Delay start of Pharmacological VTE agent (>24hrs) due to surgical blood loss or risk of bleeding: yes

## 2013-01-13 NOTE — Anesthesia Preprocedure Evaluation (Addendum)
Anesthesia Evaluation  Patient identified by MRN, date of birth, ID band Patient awake    Reviewed: Allergy & Precautions, H&P , NPO status , Patient's Chart, lab work & pertinent test results  Airway Mallampati: II TM Distance: >3 FB Neck ROM: Full    Dental  (+) Dental Advisory Given and Teeth Intact   Pulmonary sleep apnea and Continuous Positive Airway Pressure Ventilation ,  breath sounds clear to auscultation    - rales    Cardiovascular hypertension, Pt. on medications +CHF Rhythm:Regular Rate:Normal  Echo 10/2011: - Procedure narrative: Transthoracic echocardiography. Image  quality was suboptimal. The study was technically difficult, as a result of body habitus. - Left ventricle: Compared to the prior study in 2012, LVEF  has returned to near normal. The cavity size was normal.  Systolic function was mildly reduced. The estimated  ejection fraction was in the range of 45% to 50%. Doppler  parameters are consistent with abnormal left ventricular  relaxation (grade 1 diastolic dysfunction). - Left atrium: The atrium was mildly dilated. - Right ventricle: The cavity size was mildly dilated. - Atrial septum: No defect or patent foramen ovale was identified.    Neuro/Psych negative neurological ROS  negative psych ROS   GI/Hepatic negative GI ROS, Neg liver ROS,   Endo/Other  diabetes, Type 2, Oral Hypoglycemic Agents  Renal/GU Renal disease     Musculoskeletal negative musculoskeletal ROS (+)   Abdominal (+) + obese,   Peds  Hematology negative hematology ROS (+)   Anesthesia Other Findings   Reproductive/Obstetrics negative OB ROS                         Anesthesia Physical Anesthesia Plan  ASA: III  Anesthesia Plan: General   Post-op Pain Management:    Induction: Intravenous  Airway Management Planned: Oral ETT  Additional Equipment: Arterial line  Intra-op Plan:    Post-operative Plan: Extubation in OR  Informed Consent: I have reviewed the patients History and Physical, chart, labs and discussed the procedure including the risks, benefits and alternatives for the proposed anesthesia with the patient or authorized representative who has indicated his/her understanding and acceptance.   Dental advisory given  Plan Discussed with: CRNA  Anesthesia Plan Comments:        Anesthesia Quick Evaluation

## 2013-01-13 NOTE — Anesthesia Postprocedure Evaluation (Signed)
Anesthesia Post Note  Patient: Jeffrey [name]  Procedure(s) Performed: Procedure(s) (LRB): ROBOTIC ASSITED PARTIAL NEPHRECTOMY CONVERTED TO ROBOTIC ASSISTED RIGHT RADICAL NEPHRECTOMY (Right) ROBOTIC ASSISTED LAPAROSCOPIC LYSIS OF ADHESION EXTENSIVE  Anesthesia type: General  Patient location: PACU  Post pain: Pain level controlled  Post assessment: Post-op Vital signs reviewed  Last Vitals: BP 132/75  Pulse 68  Temp(Src) 37.1 C (Oral)  Resp 25  SpO2 99%  Post vital signs: Reviewed  Level of consciousness: sedated  Complications: No apparent anesthesia complications

## 2013-01-13 NOTE — H&P (Signed)
Jeffrey [name] is an 37 y.o. male.    Chief Complaint: Pre-OP Rt Partial Nephrectomy  HPI:   1 - Rt Renal Mass - PT wtih Rt 2.5cm upper / posterior solid, enhancing mass incidental on CT from ER 11/2012 on w/u of left sided abdominal pain. No additional nesions. No prior oncologic history. Appears 1 artery / 2 vein renovascular anatomy on Rt. Most recetn Cr 1.2's from ER 11/2012  2 - Punctate Rt Renal Stone - Incidental punctate (<48mm) mid calcificaiton on CT as per above. No colic episodes. No additional stones  PMH sig for morbid obesity, OSA/CPAP, gout, CHF (now asymptomatic), IDDM2. He has seen a cardiologist before, but does not actively see one.    Past Medical History  Diagnosis Date  . Diabetes mellitus   . Hypertension   . Obesity   . History of gout   . History of kidney stones   . Obstructive sleep apnea     USES C-PAP  . Renal neoplasm     RT  . CHF (congestive heart failure)     EF 20-25%    Past Surgical History  Procedure Laterality Date  . Appendectomy      No family history on file. Social History:  reports that he quit smoking about 4 weeks ago. He does not have any smokeless tobacco history on file. He reports that he uses illicit drugs (Marijuana). He reports that he does not drink alcohol.  Allergies:  Allergies  Allergen Reactions  . Aspirin Shortness Of Breath and Rash    Rash, itching, burning sensation  . Tomato Rash    No prescriptions prior to admission    No results found for this or any previous visit (from the past 48 hour(s)). No results found.  Review of Systems  Constitutional: Negative.  Negative for fever and chills.  HENT: Negative.   Eyes: Negative.   Respiratory: Negative.   Cardiovascular: Negative.   Gastrointestinal: Negative.   Genitourinary: Negative.   Musculoskeletal: Negative.   Skin: Negative.   Neurological: Negative.   Endo/Heme/Allergies: Negative.   Psychiatric/Behavioral: Negative.     There were no vitals  taken for this visit. Physical Exam  Constitutional: He is oriented to person, place, and time. He appears well-developed and well-nourished.  obese  HENT:  Head: Normocephalic and atraumatic.  Eyes: EOM are normal. Pupils are equal, round, and reactive to light.  Neck: Normal range of motion. Neck supple.  Cardiovascular: Normal rate.   Respiratory: Effort normal and breath sounds normal.  GI: Soft. Bowel sounds are normal.  Genitourinary: Penis normal.  No CVAT  Musculoskeletal: Normal range of motion.  Neurological: He is alert and oriented to person, place, and time.  Skin: Skin is warm and dry.  Psychiatric: He has a normal mood and affect. His behavior is normal. Judgment and thought content normal.     Assessment/Plan  1 - Rt Renal Mass - Mass clearly worrisome for renal cell carcinoma. Obesity only sig oncologic risk factor.   We re-discussed the role of partial nephrectomy with the overall goals being a balance of trying to achieve complete surgical excision (negative margins) while minimizing loss of normally functioning kidney. We then discussed surgical approaches including robotic and open techniques with robotic associated with a shorter convalescence. I showed the patient on their abdomen the approximately 4-6 incision (trocar) sites as well as presumed extraction sites with robotic approach as well as possible open incision sites. We specifically addressed that there may be  need to alter operative plans according to intraopertive findings including conversion to open procedure or conversion to radical nephrectomy as well as need for adjunctive procedures such as ureteral stenting to promote correct renal healing. We re-discussed specific peri-operative risks including bleeding, infection, deep vein thrombosis, pulmonary embolism, compartment syndrome, neuropathy / neuropraxia, heart attack, stroke, death, as well as long-term risks such as non-cure / need for additional  therapy and need for imaging and lab based post-op surveillance protocols. We discussed typical hospital course of approximately 2 day hospitalization, need for peri-operative drains / catheters, and typical post-hospital course with return to most non-strenuous activities by 2 weeks and ability to return to most jobs and more strenuous activity such as exercise by 6 weeks.   After this lengthy and detail discussion, including answering all of the patient's questions to their satisfaction, they have chosen to proceed with Rt robotic partial nephrectomy.   2 - Rt Renal Stone -  After careful consideration, the patient has elected to undergo observation, this is reasonable    Berley Gambrell 01/13/2013, 8:02 AM

## 2013-01-13 NOTE — Anesthesia Procedure Notes (Signed)
Procedure Name: Intubation Date/Time: 01/13/2013 12:12 PM Performed by: Leroy Libman L Patient Re-evaluated:Patient Re-evaluated prior to inductionOxygen Delivery Method: Circle system utilized Preoxygenation: Pre-oxygenation with 100% oxygen Intubation Type: IV induction Ventilation: Mask ventilation without difficulty and Oral airway inserted - appropriate to patient size Laryngoscope Size: Miller and 3 Grade View: Grade II Tube type: Oral Tube size: 8.0 mm Number of attempts: 1 Airway Equipment and Method: Stylet Placement Confirmation: ETT inserted through vocal cords under direct vision,  positive ETCO2 and breath sounds checked- equal and bilateral Secured at: 23 cm Tube secured with: Tape Dental Injury: Teeth and Oropharynx as per pre-operative assessment

## 2013-01-13 NOTE — Transfer of Care (Signed)
Immediate Anesthesia Transfer of Care Note  Patient: Jeffrey [name]  Procedure(s) Performed: Procedure(s): ROBOTIC ASSITED PARTIAL NEPHRECTOMY CONVERTED TO ROBOTIC ASSISTED RIGHT RADICAL NEPHRECTOMY (Right) ROBOTIC ASSISTED LAPAROSCOPIC LYSIS OF ADHESION EXTENSIVE  Patient Location: PACU  Anesthesia Type:General  Level of Consciousness: awake, alert , oriented, patient cooperative and responds to stimulation  Airway & Oxygen Therapy: Patient Spontanous Breathing and Patient connected to face mask oxygen  Post-op Assessment: Report given to PACU RN, Post -op Vital signs reviewed and stable and Patient moving all extremities  Post vital signs: Reviewed and stable  Complications: No apparent anesthesia complications

## 2013-01-13 NOTE — Preoperative (Signed)
Beta Blockers   Reason not to administer Beta Blockers:Not Applicable 

## 2013-01-13 NOTE — Progress Notes (Signed)
Pt placed on auto cpap 15/8 with 4 LPM O2 bleed in via nasal pillow, Pt tolerating well at this time, RT to monitor and assess as needed.

## 2013-01-14 LAB — GLUCOSE, CAPILLARY: Glucose-Capillary: 141 mg/dL — ABNORMAL HIGH (ref 70–99)

## 2013-01-14 LAB — BASIC METABOLIC PANEL
Chloride: 104 mEq/L (ref 96–112)
Creatinine, Ser: 1.61 mg/dL — ABNORMAL HIGH (ref 0.50–1.35)
GFR calc Af Amer: 61 mL/min — ABNORMAL LOW (ref >90)
Potassium: 4 mEq/L (ref 3.5–5.1)

## 2013-01-14 LAB — HEMOGLOBIN AND HEMATOCRIT, BLOOD
HCT: 37 % — ABNORMAL LOW (ref 39.0–52.0)
Hemoglobin: 12.4 g/dL — ABNORMAL LOW (ref 13.0–17.0)

## 2013-01-14 MED ORDER — VITAMINS A & D EX OINT
TOPICAL_OINTMENT | CUTANEOUS | Status: AC
  Administered 2013-01-14: 04:00:00
  Filled 2013-01-14: qty 5

## 2013-01-14 MED ORDER — DIPHENHYDRAMINE HCL 25 MG PO CAPS
ORAL_CAPSULE | ORAL | Status: AC
  Filled 2013-01-14: qty 1

## 2013-01-14 MED ORDER — DIPHENHYDRAMINE HCL 25 MG PO CAPS
25.0000 mg | ORAL_CAPSULE | Freq: Four times a day (QID) | ORAL | Status: DC | PRN
  Administered 2013-01-14: 25 mg via ORAL

## 2013-01-14 MED ORDER — BISACODYL 10 MG RE SUPP: 10.0000 mg | Freq: Every day | RECTAL | Status: DC | PRN

## 2013-01-14 NOTE — Progress Notes (Signed)
1 Day Post-Op Subjective: Patient reports some pain, but managed well with pain medicine  Objective: Vital signs in last 24 hours: Temp:  [97.2 F (36.2 C)-99.5 F (37.5 C)] 97.2 F (36.2 C) (04/12 0800) Pulse Rate:  [58-78] 68 (04/12 0800) Resp:  [15-33] 17 (04/12 0800) BP: (123-167)/(66-99) 139/93 mmHg (04/12 0800) SpO2:  [97 %-100 %] 100 % (04/12 0800) Arterial Line BP: (135-168)/(71-92) 153/92 mmHg (04/12 0800)  Intake/Output from previous day: 04/11 0701 - 04/12 0700 In: 4835 [P.O.:360; I.V.:3875; IV Piggyback:600] Out: 3175 [Urine:2350; Blood:825] Intake/Output this shift: Total I/O In: 510 [P.O.:360; I.V.:150] Out: -   Physical Exam:  Constitutional: Vital signs reviewed. WD WN in NAD   Eyes: PERRL, No scleral icterus.   Cardiovascular: RRR Pulmonary/Chest: Normal effort Abdominal: Soft. Non-tender, mildly distended, bowel sounds are somewhat decreased. Incisions are clean, dry and intact   Lab Results:  Recent Labs  01/13/13 1712 01/14/13 0335  HGB 13.4 12.4*  HCT 38.7* 37.0*   BMET  Recent Labs  01/13/13 1712 01/14/13 0335  NA 134* 137  K 4.4 4.0  CL 104 104  CO2 20 26  GLUCOSE 204* 117*  BUN 16 14  CREATININE 1.29 1.61*  CALCIUM 8.3* 8.7   No results found for this basename: LABPT, INR,  in the last 72 hours No results found for this basename: LABURIN,  in the last 72 hours Results for orders placed during the hospital encounter of 01/06/13  SURGICAL PCR SCREEN     Status: None   Collection Time    01/06/13  1:47 PM      Result Value Range Status   MRSA, PCR NEGATIVE  NEGATIVE Final   Staphylococcus aureus NEGATIVE  NEGATIVE Final   Comment:            The Xpert SA Assay (FDA     approved for NASAL specimens     in patients over 30 years of age),     is one component of     a comprehensive surveillance     program.  Test performance has     been validated by [employer] for patients greater     than or equal to 76 year old.   It is not intended     to diagnose infection nor to     guide or monitor treatment.    Studies/Results: No results found.  Assessment/Plan:   Postoperative day #1 right radical nephrectomy. He seems to be doing fairly well. Urinary output is good. He has not got out of bed yet. He has started a regular diet. I will leave him and step down one more night. Dulcolax ordered when necessary for gas, catheter will be removed   LOS: 1 day   Marcine Matar M 01/14/2013, 10:31 AM

## 2013-01-14 NOTE — Progress Notes (Signed)
2045-   Pt placed himself on CPAP with 3 LPM O2 bleed in, Pt tolerating well at this time, RT to monitor and assess as needed.

## 2013-01-14 NOTE — Op Note (Signed)
Jeffrey [name], Jeffrey [name]                  ACCOUNT NO.:  [id number]  MEDICAL RECORD NO.:  [id number]  LOCATION:  1227                         FACILITY:  [facility]  PHYSICIAN:  Sebastian Ache, MD     DATE OF BIRTH:  Dec 06, 1975  DATE OF PROCEDURE:  01/13/2013 DATE OF DISCHARGE:                              OPERATIVE REPORT   DIAGNOSIS:  Right renal mass.  PROCEDURE: 1. Right robotic-assisted radical oophorectomy, this was conversion     from partial nephrectomy. 2. Extensive adhesiolysis.  ASSISTANT:  Pecola Leisure, PA.  ESTIMATED BLOOD LOSS:  825 mL.  DRAINS: 1. Foley catheter straight  SPECIMENS: 1- right renal mass. 2- Completion, right nephrectomy.  FINDINGS: 1. Extensive omental adhesions between the abdominal wall and liver. 2. Single artery single vein, right renovascular anatomy. 3. An 50% exophytic right superior renal mass, hypofluorescence with     indocyanine green.  INDICATIONS:  Jeffrey [name] is a pleasant 37 year old gentleman with history of medical comorbidities including congestive heart failure, diabetes, and sleep apnea.  He was found incidentally on workup of left-sided abdominal pain, has a right-sided renal mass that was solid and enhancing worrisome for carcinoma.  Options were discussed including observation versus partial nephrectomy versus radical nephrectomy.  He wished to proceed with the robotic partial nephrectomy.  Informed consent was obtained and placed in medical record.  PROCEDURE IN DETAIL:  The patient being Jeffrey [name], procedure being right robotic partial nephrectomy was confirmed.  Procedure was carried out.  Time-out was performed.  Intravenous antibiotics administered. General endotracheal anesthesia was introduced.  The patient was placed right side up with full flank position and flank 15 degrees of table flexion and superior arm elevator.  Sequential pressure devices, the patient's bottom leg was bent up of the straight with further  fashioned the operative table using beanbag and 3 inch tape over foam padding. Sterile field was created by prepping and draping the patient's entire right flank and abdomen using chlorhexidine gluconate.  Next, a high-flow low pressure pneumoperitoneum was easily obtained using Veress technique in the right lower quadrant having passed the aspiration and drop test.  A 12-mm robotic camera port was then placed in position approximately 4 fingerbreadths superior lateral to the umbilicus.  Laparoscopic examination of the peritoneal cavity revealed multifocal omental adhesions between the abdominal wall and the omentum, also dense adhesions against the liver edge and gallbladder fossa and inferior liver edges were coming almost down into the true pelvis. Additional ports were placed as follows; a right subcostal 8-mm robotic port, right far lateral 8-mm robotic port, right paramedian 8-mm robotic port, 4 fingerbreadths superior to the pubic bone.  A 12 mm cyst port 4 fingerbreadths inferior medial to the camera port, 5 mm assist port, 4 fingerbreadths superior medial to the camera port, and a 10 mm port in the subxiphoid location for liver retraction.  Robot was docked and passed through electronic checks.  Attention was directed to adhesiolysis with attachments were taken down from the anterior abdominal wall.  The liver completely covered the area of the kidney, very careful adhesiolysis was performed, taking dense midwall attachments from the inferior liver edge.  These tract toward  the area of the gallbladder fossa, the gallbladder was in situ and undisturbed. Atraumatic locking grasper was then placed from the subxiphoid port across the liver to provide superior traction.  This allowed access to the area of the presumed kidney.  Incision was then made lateral to the ascending colon from the area of the hepatic flexure inferiorly towards the area of the internal ring.  The colon was  carefully swept medially. The duodenum was found very carefully Kocherized medially, inferior pole of the kidney was noted, placed on gentle lateral traction, dissection proceeded medially, ureter was encountered and also placed on gentle lateral traction.  Psoas muscle was encountered.  Dissection was then proceeded superiorly in triangle posteriorly the renal hilum.  The renal hilum was encountered consistent with a single artery single vein renovascular anatomy and vessel loops placed on each structure respectively.  I was concerned about a possible second vein superiorly, and we proceeded superior of the hilum and additional vessels were encountered.  During these maneuvers, the renal vein was swept medially sparing it.  Attention was then directed to identification of the tumor.  The kidney was carefully mobilized in its lateral edge flipping the kidney medially as the tumor was noted to be in the posterior superior location indicating this further flipped superiorly as well and a mass was present in expected location. This area was decided using a fat pad with the exophytic portion of the tumor.  Mannitol was given intravenously.  Next, 2 mL of indocyanine green was given intravenously followed by clamping of the artery.  Mass was found to be hypofluorescence, partial nephrectomy was performed of the mass keeping what appeared to be performed, but normal appearing rim of renal parenchyma with tumor specimen, this was set aside for permanent pathology.  Next, renorrhaphy was performed of the first layer of 3-0 and V-Loc suture over-sewing obvious open vessels.  There was no entrance to look  the collecting system noted.  Hilum was then unclamped.  There was significant bleeding what appeared to be 2 arterial pumpers from the area of the partial nephrectomy bed.  Multiple attempts were taken and oversewn using figure-of-eight 3-0 V-Loc suture. Adequate hemostasis cannot be achieved  with this technique.  Parenchymal apposition procedure was attempted using 2-0 V-Loc suture was between medium-sized Hem-o-lok clips and this also did not result in an acceptable hemostasis.  At this point, there is approximately 800 mL of blood loss.  It was felt that due to the difficult anatomy and poor hemostasis in the partial nephrectomy bed that radical nephrectomy the safest way to proceed.  As such, attention was re-direction of the hilum.  The arterial packet was taken using a vascular stapler followed by the venous packet resulted in excellent hemostatic control of hilum. The previous vessel loops and needles were all carefully removed.  The sponge and needle counts were correct.  The ureter was ligated using medium clips up and down, this completely freed up the completion nephrectomy specimen was placed into a large EndoCatch bag for later retrieval.  The previous mass was placed in a small EndoCatch bag.  The area of the hilum was inspected and found to be completely hemostatic as was the area of the liver edge and gallbladder fossa.  Robot was then undocked.  Specimen was retrieved by extending the lateral most port site medially for total distance approximately 6 cm.  The completion oophorectomy and partial nephrectomy assessments were each retrieval bag passing out for permanent pathology.  Previous  12 mm port sites were closed below the fascia using a figure-of-eight 2-0 Vicryl.  Retrieval site was closed by first reapproximating the transversalis using running 2-0 Vicryl and the external and internal oblique were reapproximated using a figure-of- eight PDS x6.  All skin incisions were closed using subcuticular Monocryl followed by Dermabond.  Procedure was terminated.  The patient tolerated the procedure well.  There were no immediate periprocedural complications.  The patient was taken to postanesthesia care unit in stable condition.           ______________________________ Sebastian Ache, MD     TM/MEDQ  D:  01/13/2013  T:  01/14/2013  Job:  [id number]

## 2013-01-14 NOTE — Progress Notes (Signed)
PHARMACIST - PHYSICIAN COMMUNICATION DR:  Berneice Heinrich CONCERNING:  METFORMIN SAFE ADMINISTRATION POLICY  RECOMMENDATION: Metformin has been placed on DISCONTINUE (rejected order) STATUS and should be reordered only after any of the conditions below are ruled out.  Current safety recommendations include avoiding metformin for a minimum of 48 hours after the patient's exposure to intravenous contrast media.  DESCRIPTION:  The Pharmacy Committee has adopted a policy that restricts the use of metformin in hospitalized patients until all the contraindications to administration have been ruled out. Specific contraindications are: []  Serum creatinine ? 1.5 for males [x]  Serum creatinine ? 1.4 for females []  Shock, acute MI, sepsis, hypoxemia, dehydration []  Planned administration of intravenous iodinated contrast media []  Heart Failure patients with low EF []  Acute or chronic metabolic acidosis (including DKA)     Otho Bellows PharmD Pager [phone] 01/14/2013, 7:48 AM

## 2013-01-15 LAB — BASIC METABOLIC PANEL
BUN: 17 mg/dL (ref 6–23)
Calcium: 8.9 mg/dL (ref 8.4–10.5)
GFR calc non Af Amer: 54 mL/min — ABNORMAL LOW (ref >90)
Glucose, Bld: 151 mg/dL — ABNORMAL HIGH (ref 70–99)
Sodium: 135 mEq/L (ref 135–145)

## 2013-01-15 LAB — GLUCOSE, CAPILLARY
Glucose-Capillary: 129 mg/dL — ABNORMAL HIGH (ref 70–99)
Glucose-Capillary: 118 mg/dL — ABNORMAL HIGH (ref 70–99)

## 2013-01-15 MED ORDER — OXYCODONE-ACETAMINOPHEN 5-325 MG PO TABS
1.0000 | ORAL_TABLET | ORAL | Status: DC | PRN
  Administered 2013-01-15: 1 via ORAL
  Administered 2013-01-15 – 2013-01-16 (×5): 2 via ORAL
  Filled 2013-01-15 (×2): qty 2
  Filled 2013-01-15: qty 1
  Filled 2013-01-15 (×3): qty 2

## 2013-01-15 NOTE — Progress Notes (Signed)
Patient prefers self placement with nocturnal CPAP. Equipment in room. RT set up and ensured patient is familiar with workings. Humidifier chamber with sterile water. Patient is encouraged to call if he requires further assistance tonight. He has his nasal prongs from home, which he wishes to continue using.

## 2013-01-15 NOTE — Progress Notes (Signed)
2 Days Post-Op Subjective: Patient reports that he is passing gas. He is urinating without difficulty, pain is controlled  Objective: Vital signs in last 24 hours: Temp:  [98.4 F (36.9 C)-98.9 F (37.2 C)] 98.4 F (36.9 C) (04/13 0800) Pulse Rate:  [43-83] 83 (04/13 0800) Resp:  [17-30] 21 (04/13 0800) BP: (127-160)/(78-91) 127/78 mmHg (04/13 0033) SpO2:  [94 %-100 %] 94 % (04/13 0800) Arterial Line BP: (116-154)/(76-100) 146/88 mmHg (04/13 0800) Weight:  [123.7 kg (272 lb 11.3 oz)] 123.7 kg (272 lb 11.3 oz) (04/13 0300)  Intake/Output from previous day: 04/12 0701 - 04/13 0700 In: 2860 [P.O.:960; I.V.:1800; IV Piggyback:100] Out: 6150 [Urine:6150] Intake/Output this shift: Total I/O In: 195 [P.O.:120; I.V.:75] Out: 350 [Urine:350]  Physical Exam:  Constitutional: Vital signs reviewed. WD WN in NAD   Eyes: PERRL, No scleral icterus.   Cardiovascular: RRR Pulmonary/Chest: Normal effort Abdominal: Soft. Non-tender, non-distended, bowel sounds are present, no masses, organomegaly, or guarding present. Incisions are healing well, without evidence of infection or drainage   Lab Results:  Recent Labs  01/13/13 1712 01/14/13 0335 01/15/13 0440  HGB 13.4 12.4* 12.9*  HCT 38.7* 37.0* 38.3*   BMET  Recent Labs  01/14/13 0335 01/15/13 0440  NA 137 135  K 4.0 4.0  CL 104 100  CO2 26 27  GLUCOSE 117* 151*  BUN 14 17  CREATININE 1.61* 1.58*  CALCIUM 8.7 8.9   No results found for this basename: LABPT, INR,  in the last 72 hours No results found for this basename: LABURIN,  in the last 72 hours Results for orders placed during the hospital encounter of 01/06/13  SURGICAL PCR SCREEN     Status: None   Collection Time    01/06/13  1:47 PM      Result Value Range Status   MRSA, PCR NEGATIVE  NEGATIVE Final   Staphylococcus aureus NEGATIVE  NEGATIVE Final   Comment:            The Xpert SA Assay (FDA     approved for NASAL specimens     in patients over 21 years  of age),     is one component of     a comprehensive surveillance     program.  Test performance has     been validated by [employer] for patients greater     than or equal to 45 year old.     It is not intended     to diagnose infection nor to     guide or monitor treatment.    Studies/Results: No results found.  Assessment/Plan:    postoperative day #2 laparoscopic-assisted right radical nephrectomy. He seems to be doing well. Creatinine is stable, as his hemoglobin. I will transfer him to telemetry on the fourth floor, he will ambulate, IV will be saline locked, he will be switched to oral pain medicines.    LOS: 2 days   Marcine Matar M 01/15/2013, 8:30 AM

## 2013-01-16 ENCOUNTER — Encounter (HOSPITAL_COMMUNITY): Payer: Self-pay | Admitting: Urology

## 2013-01-16 LAB — BASIC METABOLIC PANEL
BUN: 19 mg/dL (ref 6–23)
Calcium: 8.7 mg/dL (ref 8.4–10.5)
Creatinine, Ser: 1.68 mg/dL — ABNORMAL HIGH (ref 0.50–1.35)
GFR calc Af Amer: 58 mL/min — ABNORMAL LOW (ref >90)
GFR calc non Af Amer: 50 mL/min — ABNORMAL LOW (ref >90)

## 2013-01-16 LAB — GLUCOSE, CAPILLARY
Glucose-Capillary: 141 mg/dL — ABNORMAL HIGH (ref 70–99)
Glucose-Capillary: 139 mg/dL — ABNORMAL HIGH (ref 70–99)
Glucose-Capillary: 116 mg/dL — ABNORMAL HIGH (ref 70–99)

## 2013-01-16 LAB — HEMOGLOBIN AND HEMATOCRIT, BLOOD: Hemoglobin: 12.3 g/dL — ABNORMAL LOW (ref 13.0–17.0)

## 2013-01-16 MED ORDER — SENNA-DOCUSATE SODIUM 8.6-50 MG PO TABS: 1.0000 | ORAL_TABLET | Freq: Two times a day (BID) | ORAL | Status: DC

## 2013-01-16 MED ORDER — IBUPROFEN 600 MG PO TABS
600.0000 mg | ORAL_TABLET | Freq: Three times a day (TID) | ORAL | Status: DC
  Administered 2013-01-16: 600 mg via ORAL
  Filled 2013-01-16 (×4): qty 1

## 2013-01-16 MED ORDER — OXYCODONE-ACETAMINOPHEN 5-325 MG PO TABS: 1.0000 | ORAL_TABLET | ORAL | Status: DC | PRN

## 2013-01-16 MED ORDER — IBUPROFEN 200 MG PO TABS: 200.0000 mg | ORAL_TABLET | Freq: Three times a day (TID) | ORAL | Status: DC | PRN

## 2013-01-16 MED FILL — Cefazolin Sodium For Inj 1 GM: INTRAMUSCULAR | Qty: 2 | Status: AC

## 2013-01-16 MED FILL — Cefazolin in D5W Inj 1 GM/50ML: INTRAVENOUS | Qty: 50 | Status: AC

## 2013-01-16 NOTE — Discharge Summary (Signed)
Physician Discharge Summary  Patient ID: Jeffrey [name] MRN: [id number] DOB/AGE: Mar 04, 1976 37 y.o.  Admit date: 01/13/2013 Discharge date: 01/16/2013  Admission Diagnoses: Rt Renal Mass, Rt Renal Stone  Discharge Diagnoses: Rt Renal Mass, Rt Renal Stone2   Discharged Condition: good  Hospital Course:   Pt underwent Rt robotic assisted partial converted to radical nephrectomy on 4/11, the day of admission, without acute complications. He was admitted to the step-down floor for close monitoring based on his extensive history of sleep apnea and other comorbidity. By POD 1 he was tolerating diet and pain controlled. By POD 2 he was transferred to regular flood and began voiding and ambulating. By POD 3 , the day of discharge, he was ambulatory, pain controlled, tolerating diet, passing flatus, Hgb stable, Cr acceptable,and felt to be adequate for discharge. Path pending at discharge.   Consults: None  Significant Diagnostic Studies: labs: Hgb <12. Cr 1.68  Treatments: surgery:  Rt robotic assisted partial converted to radical nephrectomy on 4/11  Discharge Exam: Blood pressure 148/93, pulse 72, temperature 98.4 F (36.9 C), temperature source Oral, resp. rate 18, height 5\' 7"  (1.702 m), weight 123.7 kg (272 lb 11.3 oz), SpO2 97.00%. General appearance: alert, cooperative and appears stated age Head: Normocephalic, without obvious abnormality, atraumatic Eyes: conjunctivae/corneas clear. PERRL, EOM's intact. Fundi benign. Ears: normal TM's and external ear canals both ears Nose: Nares normal. Septum midline. Mucosa normal. No drainage or sinus tenderness. Throat: lips, mucosa, and tongue normal; teeth and gums normal Neck: no adenopathy, no carotid bruit, no JVD, supple, symmetrical, trachea midline and thyroid not enlarged, symmetric, no tenderness/mass/nodules Back: symmetric, no curvature. ROM normal. No CVA tenderness. Resp: clear to auscultation bilaterally Cardio: regular rate and  rhythm, S1, S2 normal, no murmur, click, rub or gallop GI: soft, non-tender; bowel sounds normal; no masses,  no organomegaly Male genitalia: normal Extremities: extremities normal, atraumatic, no cyanosis or edema Pulses: 2+ and symmetric Skin: Skin color, texture, turgor normal. No rashes or lesions Lymph nodes: Cervical, supraclavicular, and axillary nodes normal. Neurologic: Grossly normal Incision/Wound: Recent port sites and extraction sites c/d/i. No hernias.   Disposition: 01-Home or Self Care   Future Appointments Provider Department Dept Phone   03/31/2013 8:15 AM Pricilla Riffle, MD [facility] Main [facility] [place]) [phone]       Medication List    TAKE these medications       colchicine 0.6 MG tablet  Take 0.6 mg by mouth daily as needed (for gout).     furosemide 40 MG tablet  Commonly known as:  LASIX  Take 40 mg by mouth 2 (two) times daily.     gemfibrozil 600 MG tablet  Commonly known as:  LOPID  Take 600 mg by mouth 2 (two) times daily before a meal.     HYDROcodone-acetaminophen 5-325 MG per tablet  Commonly known as:  NORCO  Take 1-2 tablets by mouth every 6 (six) hours as needed for pain.     insulin NPH-regular (70-30) 100 UNIT/ML injection  Commonly known as:  NOVOLIN 70/30  Inject 34 Units into the skin 2 (two) times daily with a meal.     lisinopril 20 MG tablet  Commonly known as:  PRINIVIL,ZESTRIL  Take 20 mg by mouth every morning.     metFORMIN 500 MG tablet  Commonly known as:  GLUCOPHAGE  Take 500 mg by mouth 2 (two) times daily with a meal.     metoprolol 50 MG tablet  Commonly known as:  LOPRESSOR  Take 50 mg by mouth 2 (two) times daily.           Follow-up Information   Follow up with Sebastian Ache, MD On 01/30/2013. (at 9:00)    Contact information:   [address] [place] [address] [phone]       Signed: Sebastian Ache 01/16/2013, 7:54 AM

## 2013-01-21 ENCOUNTER — Emergency Department (HOSPITAL_COMMUNITY): Payer: Self-pay

## 2013-01-21 ENCOUNTER — Encounter (HOSPITAL_COMMUNITY): Payer: Self-pay | Admitting: Radiology

## 2013-01-21 ENCOUNTER — Emergency Department (HOSPITAL_COMMUNITY)
Admission: EM | Admit: 2013-01-21 | Discharge: 2013-01-21 | Disposition: A | Discharge status: Home / Self Care | Payer: Self-pay | Attending: Emergency Medicine | Admitting: Emergency Medicine

## 2013-01-21 DIAGNOSIS — E669 Obesity, unspecified: Secondary | ICD-10-CM | POA: Insufficient documentation

## 2013-01-21 DIAGNOSIS — R142 Eructation: Secondary | ICD-10-CM | POA: Insufficient documentation

## 2013-01-21 DIAGNOSIS — Z87442 Personal history of urinary calculi: Secondary | ICD-10-CM | POA: Insufficient documentation

## 2013-01-21 DIAGNOSIS — Z862 Personal history of diseases of the blood and blood-forming organs and certain disorders involving the immune mechanism: Secondary | ICD-10-CM | POA: Insufficient documentation

## 2013-01-21 DIAGNOSIS — R1084 Generalized abdominal pain: Secondary | ICD-10-CM | POA: Insufficient documentation

## 2013-01-21 DIAGNOSIS — E119 Type 2 diabetes mellitus without complications: Secondary | ICD-10-CM | POA: Insufficient documentation

## 2013-01-21 DIAGNOSIS — Z87891 Personal history of nicotine dependence: Secondary | ICD-10-CM | POA: Insufficient documentation

## 2013-01-21 DIAGNOSIS — Z794 Long term (current) use of insulin: Secondary | ICD-10-CM | POA: Insufficient documentation

## 2013-01-21 DIAGNOSIS — R109 Unspecified abdominal pain: Secondary | ICD-10-CM

## 2013-01-21 DIAGNOSIS — Z79899 Other long term (current) drug therapy: Secondary | ICD-10-CM | POA: Insufficient documentation

## 2013-01-21 DIAGNOSIS — I509 Heart failure, unspecified: Secondary | ICD-10-CM | POA: Insufficient documentation

## 2013-01-21 DIAGNOSIS — K5900 Constipation, unspecified: Secondary | ICD-10-CM | POA: Insufficient documentation

## 2013-01-21 DIAGNOSIS — Z85528 Personal history of other malignant neoplasm of kidney: Secondary | ICD-10-CM | POA: Insufficient documentation

## 2013-01-21 DIAGNOSIS — I10 Essential (primary) hypertension: Secondary | ICD-10-CM | POA: Insufficient documentation

## 2013-01-21 DIAGNOSIS — G4733 Obstructive sleep apnea (adult) (pediatric): Secondary | ICD-10-CM | POA: Insufficient documentation

## 2013-01-21 DIAGNOSIS — Z8639 Personal history of other endocrine, nutritional and metabolic disease: Secondary | ICD-10-CM | POA: Insufficient documentation

## 2013-01-21 DIAGNOSIS — R141 Gas pain: Secondary | ICD-10-CM | POA: Insufficient documentation

## 2013-01-21 LAB — COMPREHENSIVE METABOLIC PANEL
ALT: 39 U/L (ref 0–53)
AST: 26 U/L (ref 0–37)
Albumin: 3.9 g/dL (ref 3.5–5.2)
Alkaline Phosphatase: 58 U/L (ref 39–117)
Chloride: 99 mEq/L (ref 96–112)
Potassium: 4.3 mEq/L (ref 3.5–5.1)
Sodium: 136 mEq/L (ref 135–145)
Total Protein: 8.2 g/dL (ref 6.0–8.3)

## 2013-01-21 LAB — CBC WITH DIFFERENTIAL/PLATELET
Basophils Absolute: 0 10*3/uL (ref 0.0–0.1)
Basophils Relative: 0 % (ref 0–1)
Eosinophils Absolute: 0.1 10*3/uL (ref 0.0–0.7)
MCH: 29.1 pg (ref 26.0–34.0)
MCHC: 34.7 g/dL (ref 30.0–36.0)
Neutro Abs: 8.6 10*3/uL — ABNORMAL HIGH (ref 1.7–7.7)
Neutrophils Relative %: 68 % (ref 43–77)
Platelets: 493 10*3/uL — ABNORMAL HIGH (ref 150–400)

## 2013-01-21 MED ORDER — MORPHINE SULFATE 4 MG/ML IJ SOLN
4.0000 mg | Freq: Once | INTRAMUSCULAR | Status: AC
  Administered 2013-01-21: 4 mg via INTRAVENOUS
  Filled 2013-01-21: qty 1

## 2013-01-21 MED ORDER — ONDANSETRON HCL 4 MG/2ML IJ SOLN
4.0000 mg | Freq: Once | INTRAMUSCULAR | Status: AC
  Administered 2013-01-21: 4 mg via INTRAVENOUS
  Filled 2013-01-21: qty 2

## 2013-01-21 MED ORDER — FLEET ENEMA 7-19 GM/118ML RE ENEM
1.0000 | ENEMA | Freq: Once | RECTAL | Status: AC
  Administered 2013-01-21: 1 via RECTAL
  Filled 2013-01-21: qty 1

## 2013-01-21 MED ORDER — SODIUM CHLORIDE 0.9 % IV SOLN
INTRAVENOUS | Status: DC
  Administered 2013-01-21 (×2): via INTRAVENOUS

## 2013-01-21 MED ORDER — IOHEXOL 300 MG/ML  SOLN
50.0000 mL | Freq: Once | INTRAMUSCULAR | Status: AC | PRN
  Administered 2013-01-21: 50 mL via ORAL

## 2013-01-21 MED ORDER — IOHEXOL 300 MG/ML  SOLN
75.0000 mL | Freq: Once | INTRAMUSCULAR | Status: AC | PRN
  Administered 2013-01-21: 75 mL via INTRAVENOUS

## 2013-01-21 NOTE — ED Notes (Signed)
Pt had three BM's first two liquid brown in color small in size. The 3rd one pt states was a full large size and had dark red color blood MD aware this BM.

## 2013-01-21 NOTE — ED Notes (Signed)
Patient transported to CT 

## 2013-01-21 NOTE — ED Provider Notes (Signed)
History     CSN: [id number]  Arrival date & time 01/21/13  1703   First MD Initiated Contact with Patient 01/21/13 1719      No chief complaint on file.   (Consider location/radiation/quality/duration/timing/severity/associated sxs/prior treatment) HPI Comments: Patient presents to the ED for constipation. 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  Procedure was without noted complications.  The patient reports he has not had a bowel movement since after his procedure.  He now has diffuse abdominal discomfort associated with a bloating sensation. He has not been passing gas. Has tried taking Colace, Dulcolax, magnesium citrate, and herbal tea without production of bowel movement. Patient does note after the last time he strained in attempt to have a bowel movement there was some dark red blood from his rectum.  Denies any nausea, vomiting, fever, chills, or sweats.  Right abdominal and flank incisions healing well without signs of infection or drainage.  The history is provided by the patient.    Past Medical History  Diagnosis Date  . Diabetes mellitus   . Hypertension   . Obesity   . History of gout   . History of kidney stones   . Obstructive sleep apnea     USES C-PAP  . Renal neoplasm     RT  . CHF (congestive heart failure)     EF 20-25%    Past Surgical History  Procedure Laterality Date  . Appendectomy    . Robotic assited partial nephrectomy Right 01/13/2013    Procedure: ROBOTIC ASSITED PARTIAL NEPHRECTOMY CONVERTED TO ROBOTIC ASSISTED RIGHT RADICAL NEPHRECTOMY;  Surgeon: Sebastian Ache, MD;  Location: [facility];  Service: Urology;  Laterality: Right;  . Robotic assisted laparoscopic lysis of adhesion  01/13/2013    Procedure: ROBOTIC ASSISTED LAPAROSCOPIC LYSIS OF ADHESION EXTENSIVE;  Surgeon: Sebastian Ache, MD;  Location: [facility];  Service: Urology;;    No family history on file.  History   Substance Use Topics  . Smoking status: Former Smoker -- 0.50 packs/day    Quit date: 12/16/2012  . Smokeless tobacco: Not on file  . Alcohol Use: No     Comment: quit etoh 2011      Review of Systems  Gastrointestinal: Positive for abdominal pain and constipation.  All other systems reviewed and are negative.    Allergies  Aspirin and Tomato  Home Medications   Current Outpatient Rx  Name  Route  Sig  Dispense  Refill  . bisacodyl (FLEET LAXATIVE) 5 MG EC tablet   Oral   Take 15 mg by mouth once.         . colchicine 0.6 MG tablet   Oral   Take 0.6 mg by mouth daily as needed (for gout).         . furosemide (LASIX) 40 MG tablet   Oral   Take 40 mg by mouth 2 (two) times daily.         [name] gemfibrozil (LOPID) 600 MG tablet   Oral   Take 600 mg by mouth 2 (two) times daily before a meal.         . insulin NPH-insulin regular (NOVOLIN 70/30) (70-30) 100 UNIT/ML injection   Subcutaneous   Inject 34 Units into the skin 2 (two) times daily with a meal.   10 mL   5   . lisinopril (PRINIVIL,ZESTRIL) 20 MG tablet   Oral   Take 20 mg by mouth every morning.         [name]  magnesium citrate SOLN   Oral   Take 1 Bottle by mouth once.         . metFORMIN (GLUCOPHAGE) 500 MG tablet   Oral   Take 500 mg by mouth 2 (two) times daily with a meal.         . metoprolol (LOPRESSOR) 50 MG tablet   Oral   Take 50 mg by mouth 2 (two) times daily.         [name] oxyCODONE-acetaminophen (PERCOCET/ROXICET) 5-325 MG per tablet   Oral   Take 1-2 tablets by mouth every 4 (four) hours as needed.   40 tablet   0   . sennosides-docusate sodium (SENOKOT-S) 8.6-50 MG tablet   Oral   Take 1 tablet by mouth 2 (two) times daily. While taking pain meds to prevent constipation.   30 tablet   1     BP 137/82  Pulse 89  Temp(Src) 98.2 F (36.8 C) (Oral)  Resp 18  SpO2 96%  Physical Exam  Nursing note and vitals reviewed. Constitutional: He is oriented to person,  place, and time. He appears well-developed and well-nourished.  HENT:  Head: Normocephalic and atraumatic.  Eyes: Conjunctivae and EOM are normal.  Neck: Normal range of motion. Neck supple.  Cardiovascular: Normal rate, regular rhythm and normal heart sounds.   Pulmonary/Chest: Effort normal and breath sounds normal. No respiratory distress.  Abdominal: Bowel sounds are absent. There is generalized tenderness. There is no CVA tenderness.  Laparoscopic surgical incision sites along right side of abdomen and flank- healing well without localized erythema, swelling, or signs of infection  Genitourinary: Rectal exam shows no external hemorrhoid, no internal hemorrhoid and no fissure.  No fecal impaction felt on DRE  Musculoskeletal: Normal range of motion.  Neurological: He is alert and oriented to person, place, and time.  Skin: Skin is warm and dry.  Psychiatric: He has a normal mood and affect.    ED Course  Procedures (including critical care time)  Labs Reviewed  CBC WITH DIFFERENTIAL - Abnormal; Notable for the following:    WBC 12.6 (*)    HCT 38.0 (*)    Platelets 493 (*)    Neutro Abs 8.6 (*)    All other components within normal limits  COMPREHENSIVE METABOLIC PANEL - Abnormal; Notable for the following:    Glucose, Bld 113 (*)    BUN 25 (*)    Creatinine, Ser 1.58 (*)    GFR calc non Af Amer 54 (*)    GFR calc Af Amer 63 (*)    All other components within normal limits   Ct Abdomen Pelvis W Contrast  01/21/2013  *RADIOLOGY REPORT*  Clinical Data: Right renal neoplasm post nephrectomy, abdominal pain, question small bowel obstruction, past history diabetes, hypertension, gout, CHF, appendectomy  CT ABDOMEN AND PELVIS WITH CONTRAST  Technique:  Multidetector CT imaging of the abdomen and pelvis was performed following the standard protocol during bolus administration of intravenous contrast. Sagittal and coronal MPR images reconstructed from axial data set.  Contrast: 50mL  OMNIPAQUE IOHEXOL 300 MG/ML  SOLN orally, 75mL OMNIPAQUE IOHEXOL 300 MG/ML  SOLN  Comparison: 11/07/2012  Findings: Minimal atelectasis at lung bases. Interval right nephrectomy with a focal fluid collection containing gas at the right renal fossa, collection measuring 7.5 x 4.9 x 8.5 cm in size, could represent a sterile or infected collection. Focal area of abnormal attenuation identified at the anterior aspect of the right lobe of the liver inferiorly, new since 11/07/2012.  This could represent mass or contusion in patient having recently undergone right side abdominal surgery.  Extensive subcutaneous soft tissue gas and mild associated stranding in the right upper quadrant abdominal wall likely related to recent surgery. No discrete focal enhancing fluid collection seen to suggest abdominal wall abscess. Remainder of liver, spleen, pancreas, left kidney, and adrenal glands normal appearance. Appendix surgically absent.  Increased stool in rectum. Stomach and bowel loops otherwise normal appearance. No mass, adenopathy, or ascites. Minimal stranding of presacral fat. No definite free intraperitoneal air. Question tiny periumbilical hernia containing fat. No acute osseous findings.  IMPRESSION: Focal gas and fluid collection at the right renal fossa post nephrectomy 7.5 x 4.9 x 8.5 cm in size question sterile versus infected postoperative collection. Scattered soft tissue gas and stranding within subcutaneous tissues of the right abdominal wall likely related to preceding surgery. Focal area of abnormal attenuation identified at the anterior inferior margin of the right lobe of the liver could represent an interval hepatic mass or hepatic contusion/injury related to recent surgery; consider follow-up CT abdomen with contrast in 3-4 months to reassess.   Original Report Authenticated By: Ulyses Southward, M.D.    Dg Abd Acute W/chest  01/21/2013  *RADIOLOGY REPORT*  Clinical Data: Abdominal pain, constipation, postop  right renal surgery  ACUTE ABDOMEN SERIES (ABDOMEN 2 VIEW & CHEST 1 VIEW)  Comparison: CT abdomen pelvis dated 11/07/2012  Findings: Lungs are essentially clear.  No focal consolidation. No pleural effusion or pneumothorax.  The heart is normal in size.  Nonspecific bowel gas pattern but without disproportionate small bowel dilatation to suggest small bowel obstruction.  Multiple air fluid levels within the colon on the upright view. Additional, there are mildly streaky lucencies in the right mid abdomen on that view, of uncertain etiology/significance.  Visualized osseous structures are within normal limits.  IMPRESSION: No evidence of acute cardiopulmonary disease.  No evidence of small bowel obstruction or free air.  Streaky lucencies in the right mid abdomen on the upright view, of uncertain etiology.  Consider CT abdomen pelvis for further characterization as clinically warranted.   Original Report Authenticated By: Charline Bills, M.D.      1. Constipation   2. Abdominal pain       MDM  Patient presenting to the ED status post right nephrectomy on 01/13/2013.  Patient has not had a bowel movement since surgery despite taking several over-the-counter stool softeners and magnesium citrate.  Concern for small bowel structure versus constipation.    Mild leukocytosis at 12.6. Patient is afebrile, nontoxic appearing, vital signs are stable. Urology consulted, Dr. Vernie Ammons, to review results of CT scan- no findings of immediate concern.  Will manage constipation with Fleet's enemas here in the ED.  Signed out to Dr. Anitra Lauth for continuation of care and disposition when appropriate.       Garlon Hatchet, PA-C 01/22/13 1005

## 2013-01-21 NOTE — ED Provider Notes (Signed)
After 2 enemas patient is now having a large amount of stool. He states he's having some cramping now that he's starting to pass bowel movements and states since that started he's had some sharp chest pain that sounds more like GI.  EKG without acute findings. The patient's symptoms are all GI related. A urology looked at patient's CT and feel there is nothing acute. He will follow up in the office all of this was explained to the patient and he was discharged home  Gwyneth Sprout, MD 01/21/13 2240

## 2013-01-21 NOTE — ED Provider Notes (Signed)
Pt having multiple large BM's after 2 enemas.  When started passing stool abd cramping returned and then developed sharp pain in the chest that lasted seconds.  Feel this is all GI related and EKG without any changes.  No chest pains at home and only stated after getting enemas in the last 1 hour.   Date: 01/21/2013  Rate: 83  Rhythm: normal sinus rhythm  QRS Axis: normal  Intervals: normal  ST/T Wave abnormalities: nonspecific T wave changes  Conduction Disutrbances:none  Narrative Interpretation:   Old EKG Reviewed: unchanged    Gwyneth Sprout, MD 01/21/13 2249

## 2013-01-21 NOTE — ED Notes (Signed)
Pt states he had surgery last Friday. States he had his R kidney removed. Pt reports that he has been unable to have a bowel movement since last Friday. Pt states he has tried Ducolax, Mag Citrate, and "move tea" laxative tea with no results. State he had some dark blood from rectum the last time he tried to have a bowel movement. Pt c/o diffuse lower abdominal pain.

## 2013-01-24 NOTE — ED Provider Notes (Signed)
Medical screening examination/treatment/procedure(s) were conducted as a shared visit with non-physician practitioner(s) and myself.  I personally evaluated the patient during the encounter   Jeffrey Sprout, MD 01/24/13 [phone]

## 2013-03-08 ENCOUNTER — Emergency Department (HOSPITAL_COMMUNITY): Payer: Self-pay

## 2013-03-08 ENCOUNTER — Encounter (HOSPITAL_COMMUNITY): Payer: Self-pay | Admitting: Emergency Medicine

## 2013-03-08 ENCOUNTER — Emergency Department (HOSPITAL_COMMUNITY)
Admission: EM | Admit: 2013-03-08 | Discharge: 2013-03-08 | Disposition: A | Discharge status: Home / Self Care | Payer: Self-pay | Attending: Emergency Medicine | Admitting: Emergency Medicine

## 2013-03-08 DIAGNOSIS — Z794 Long term (current) use of insulin: Secondary | ICD-10-CM | POA: Insufficient documentation

## 2013-03-08 DIAGNOSIS — Z8739 Personal history of other diseases of the musculoskeletal system and connective tissue: Secondary | ICD-10-CM | POA: Insufficient documentation

## 2013-03-08 DIAGNOSIS — F17200 Nicotine dependence, unspecified, uncomplicated: Secondary | ICD-10-CM | POA: Insufficient documentation

## 2013-03-08 DIAGNOSIS — Z79899 Other long term (current) drug therapy: Secondary | ICD-10-CM | POA: Insufficient documentation

## 2013-03-08 DIAGNOSIS — I509 Heart failure, unspecified: Secondary | ICD-10-CM | POA: Insufficient documentation

## 2013-03-08 DIAGNOSIS — I10 Essential (primary) hypertension: Secondary | ICD-10-CM | POA: Insufficient documentation

## 2013-03-08 DIAGNOSIS — E119 Type 2 diabetes mellitus without complications: Secondary | ICD-10-CM | POA: Insufficient documentation

## 2013-03-08 DIAGNOSIS — R079 Chest pain, unspecified: Secondary | ICD-10-CM

## 2013-03-08 DIAGNOSIS — G4733 Obstructive sleep apnea (adult) (pediatric): Secondary | ICD-10-CM | POA: Insufficient documentation

## 2013-03-08 DIAGNOSIS — Z87442 Personal history of urinary calculi: Secondary | ICD-10-CM | POA: Insufficient documentation

## 2013-03-08 DIAGNOSIS — R0789 Other chest pain: Secondary | ICD-10-CM | POA: Insufficient documentation

## 2013-03-08 DIAGNOSIS — Z85528 Personal history of other malignant neoplasm of kidney: Secondary | ICD-10-CM | POA: Insufficient documentation

## 2013-03-08 LAB — BASIC METABOLIC PANEL
BUN: 19 mg/dL (ref 6–23)
CO2: 27 mEq/L (ref 19–32)
Calcium: 9.5 mg/dL (ref 8.4–10.5)
GFR calc non Af Amer: 61 mL/min — ABNORMAL LOW (ref >90)
Glucose, Bld: 206 mg/dL — ABNORMAL HIGH (ref 70–99)
Potassium: 3.5 mEq/L (ref 3.5–5.1)

## 2013-03-08 LAB — CBC
HCT: 39.5 % (ref 39.0–52.0)
Hemoglobin: 13.3 g/dL (ref 13.0–17.0)
MCH: 27.9 pg (ref 26.0–34.0)
MCHC: 33.7 g/dL (ref 30.0–36.0)

## 2013-03-08 LAB — POCT I-STAT TROPONIN I

## 2013-03-08 MED ORDER — GI COCKTAIL ~~LOC~~
30.0000 mL | Freq: Once | ORAL | Status: AC
  Administered 2013-03-08: 30 mL via ORAL
  Filled 2013-03-08: qty 30

## 2013-03-08 NOTE — ED Provider Notes (Signed)
History     CSN: [id number]  Arrival date & time 03/08/13  1345   First MD Initiated Contact with Patient 03/08/13 1405      Chief Complaint  Patient presents with  . Chest Pain    Chest pain started will r/epigastric pain yesterday    (Consider location/radiation/quality/duration/timing/severity/associated sxs/prior treatment) HPI Comments: Pt presents to the ED for chest pain.  Pain described as a sharp, stabbing sensation localized to the right side of his chest.  Denies any radiation to the extremities or to the jaw.  Denies any associated SOB, palpitations, dizziness, weakness, numbness, or paresthesias.  Chest pain is not related to exertion.  No prior episodes of this pain.  Pt initially thought this was indigestion so he ate some baking soda yesterday with minimal improvement of sx.  Pt has hx of CHF, previously followed by [facility] cardiology with negative nuclear stress test 10/23/11.  Recently seen by [facility] cardiology for pre-op clearance.  States he does not know if any family members had CAD or MI.  No recent sick contacts, cough, fevers, sweats, or chills.  No LE edema, calf pain,or recent travel.  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  The history is provided by the patient.    Past Medical History  Diagnosis Date  . Diabetes mellitus   . Hypertension   . Obesity   . History of gout   . History of kidney stones   . Obstructive sleep apnea     USES C-PAP  . Renal neoplasm     RT  . CHF (congestive heart failure)     EF 20-25%    Past Surgical History  Procedure Laterality Date  . Appendectomy    . Robotic assited partial nephrectomy Right 01/13/2013    Procedure: ROBOTIC ASSITED PARTIAL NEPHRECTOMY CONVERTED TO ROBOTIC ASSISTED RIGHT RADICAL NEPHRECTOMY;  Surgeon: Sebastian Ache, MD;  Location: [facility];  Service: Urology;  Laterality: Right;  . Robotic assisted laparoscopic lysis of adhesion  01/13/2013    Procedure: ROBOTIC ASSISTED LAPAROSCOPIC LYSIS OF  ADHESION EXTENSIVE;  Surgeon: Sebastian Ache, MD;  Location: [facility];  Service: Urology;;    Family History  Problem Relation Age of Onset  . Diabetes Other   . Hypertension Other     History  Substance Use Topics  . Smoking status: Current Every Day Smoker -- 0.50 packs/day    Types: Cigarettes    Last Attempt to Quit: 12/16/2012  . Smokeless tobacco: Not on file  . Alcohol Use: No     Comment: quit etoh 2011      Review of Systems  Cardiovascular: Positive for chest pain.  All other systems reviewed and are negative.    Allergies  Aspirin and Tomato  Home Medications   Current Outpatient Rx  Name  Route  Sig  Dispense  Refill  . colchicine 0.6 MG tablet   Oral   Take 0.6 mg by mouth daily as needed (for gout).         . furosemide (LASIX) 40 MG tablet   Oral   Take 40 mg by mouth 2 (two) times daily.         [name] gemfibrozil (LOPID) 600 MG tablet   Oral   Take 600 mg by mouth 2 (two) times daily before a meal.         . insulin NPH-insulin regular (NOVOLIN 70/30) (70-30) 100 UNIT/ML injection   Subcutaneous   Inject 34 Units into the skin 2 (two) times daily with  a meal.   10 mL   5   . lisinopril (PRINIVIL,ZESTRIL) 20 MG tablet   Oral   Take 20 mg by mouth every morning.         . metFORMIN (GLUCOPHAGE) 500 MG tablet   Oral   Take 500 mg by mouth 2 (two) times daily with a meal.         . metoprolol (LOPRESSOR) 50 MG tablet   Oral   Take 50 mg by mouth 2 (two) times daily.           BP 120/67  Pulse 83  Temp(Src) 98.3 F (36.8 C) (Oral)  Wt 280 lb (127.007 kg)  BMI 43.84 kg/m2  SpO2 98%  Physical Exam  Nursing note and vitals reviewed. Constitutional: He is oriented to person, place, and time. No distress.  Morbidly obese  HENT:  Head: Normocephalic and atraumatic.  Mouth/Throat: Oropharynx is clear and moist.  Eyes: Conjunctivae and EOM are normal. Pupils are equal, round, and reactive to light.  Neck: Normal range of  motion. Neck supple.  Cardiovascular: Normal rate, regular rhythm and normal heart sounds.   Pulmonary/Chest: Effort normal and breath sounds normal. No respiratory distress. He has no wheezes.  Chest pain not reproducible with palpation to chest wall  Abdominal: Soft. Bowel sounds are normal. There is no tenderness. There is no guarding.  Musculoskeletal: Normal range of motion. He exhibits no edema.  No calf swelling, tenderness, asymmetry, or palpable cord, negative Homan's sign  Neurological: He is alert and oriented to person, place, and time. He has normal strength. No cranial nerve deficit or sensory deficit.  CN grossly intact, no acute neuro deficits appreciated  Skin: Skin is warm and dry. He is not diaphoretic.  Psychiatric: He has a normal mood and affect.    ED Course  Procedures (including critical care time)   Date: 03/08/2013  Rate: 79  Rhythm: normal sinus rhythm  QRS Axis: normal  Intervals: normal  ST/T Wave abnormalities: normal  Conduction Disutrbances:left anterior fascicular block  Narrative Interpretation: NSR, no STEMI  Old EKG Reviewed: unchanged    Labs Reviewed  BASIC METABOLIC PANEL - Abnormal; Notable for the following:    Sodium 134 (*)    Glucose, Bld 206 (*)    Creatinine, Ser 1.43 (*)    GFR calc non Af Amer 61 (*)    GFR calc Af Amer 71 (*)    All other components within normal limits  CBC  PRO B NATRIURETIC PEPTIDE  POCT I-STAT TROPONIN I   Dg Chest Port 1 View  03/08/2013   *RADIOLOGY REPORT*  Clinical Data: Diabetes.  Chest pain.  PORTABLE CHEST - 1 VIEW  Comparison: 01/21/2013.  Findings: Cardiopericardial silhouette and mediastinal contours are normal. Monitoring leads are projected over the chest.  No airspace disease.  No effusion.  IMPRESSION: No active cardiopulmonary disease.   Original Report Authenticated By: Andreas Newport, M.D.     1. Chest pain       MDM   EKG NSR, no acute ischemic changes. Trop negative.  CXR  clear.  Labs largely WNL, SrCr elevated but improving.  Sx improved with PO GI cocktail- suspicion that pain is GI related.  Doubt ACS, PE, dissection, or other vascular collapse.  FU with [place] cardiology, Dr. Tenny Craw.  May continue OTC maalox and gas-x to help with indigestion sx.  Discussed plan with pt, he agreed.  Return precautions advised.        Rosezella Florida  Allyne Gee, PA-C 03/08/13 1554

## 2013-03-08 NOTE — Progress Notes (Signed)
[facility] has seen patient. Patient stated that he was pending medicaid. Stated he was former [facility] and wanted to know if he could go back to that practice. I told him the [facility] was closed but he could go to [facility] at [place], which was formally [facility].

## 2013-03-08 NOTE — ED Notes (Signed)
Pt c/o r/epigastric pain yesterday. Denies pain this am. Pain reoccurred at 0900 today. Denies NV, denies shortness of breath

## 2013-03-09 NOTE — ED Provider Notes (Signed)
Medical screening examination/treatment/procedure(s) were performed by non-physician practitioner and as supervising physician I was immediately available for consultation/collaboration.   Lyanne Co, MD 03/09/13 2134

## 2013-03-31 ENCOUNTER — Ambulatory Visit (INDEPENDENT_AMBULATORY_CARE_PROVIDER_SITE_OTHER): Payer: Self-pay | Admitting: Internal Medicine

## 2013-03-31 ENCOUNTER — Encounter: Payer: Self-pay | Admitting: Internal Medicine

## 2013-03-31 VITALS — BP 120/92 | HR 64 | Ht 67.0 in | Wt 295.5 lb

## 2013-03-31 DIAGNOSIS — I10 Essential (primary) hypertension: Secondary | ICD-10-CM

## 2013-03-31 LAB — LIPID PANEL
LDL Cholesterol: 87 mg/dL (ref 0–99)
Total CHOL/HDL Ratio: 6
Triglycerides: 144 mg/dL (ref 0.0–149.0)
VLDL: 28.8 mg/dL (ref 0.0–40.0)

## 2013-03-31 LAB — BASIC METABOLIC PANEL
CO2: 21 mEq/L (ref 19–32)
Chloride: 107 mEq/L (ref 96–112)
Creatinine, Ser: 1.8 mg/dL — ABNORMAL HIGH (ref 0.4–1.5)
Potassium: 3.9 mEq/L (ref 3.5–5.1)

## 2013-03-31 NOTE — Progress Notes (Signed)
HPI The patient has a history of CHF, DM, morbid obesity, gout, HTN and sleep apnea. He was admitted in early 2012. LVEFat that time was 20 to 25% Follwed at [facility] cardiology.  He was admitted again in Jan 2013 Complaints of chest pressure Felt to be musculoskeletal. Myovew was done that showed normal perfusion. Echo in Jan 2013 showing LVEF 45 To 50% I saw him in clinic in March  Since then he has undergone kidney surgery  He said it went well.  I have not seen surgical reprots. He denies CP  No SOB  Active.  Feels good..  Allergies  Allergen Reactions  . Aspirin Shortness Of Breath and Rash    Rash, itching, burning sensation  (Patient reports he tolerates other NSAIDS)   . Tomato Rash    Current Outpatient Prescriptions  Medication Sig Dispense Refill  . colchicine 0.6 MG tablet Take 0.6 mg by mouth daily as needed (for gout).      . furosemide (LASIX) 40 MG tablet Take 40 mg by mouth 2 (two) times daily.      [name] gemfibrozil (LOPID) 600 MG tablet Take 600 mg by mouth 2 (two) times daily before a meal.      . insulin NPH-insulin regular (NOVOLIN 70/30) (70-30) 100 UNIT/ML injection Inject 34 Units into the skin 2 (two) times daily with a meal.  10 mL  5  . lisinopril (PRINIVIL,ZESTRIL) 20 MG tablet Take 20 mg by mouth every morning.      . metFORMIN (GLUCOPHAGE) 500 MG tablet Take 500 mg by mouth 2 (two) times daily with a meal.      . metoprolol (LOPRESSOR) 50 MG tablet Take 50 mg by mouth 2 (two) times daily.      . [DISCONTINUED] cetirizine (ZYRTEC ALLERGY) 10 MG tablet Take 1 tablet (10 mg total) by mouth daily.  30 tablet  1   No current facility-administered medications for this visit.    Past Medical History  Diagnosis Date  . Diabetes mellitus   . Hypertension   . Obesity   . History of gout   . History of kidney stones   . Obstructive sleep apnea     USES C-PAP  . Renal neoplasm     RT  . CHF (congestive heart failure)     EF 20-25%    Past Surgical History   Procedure Laterality Date  . Appendectomy    . Robotic assited partial nephrectomy Right 01/13/2013    Procedure: ROBOTIC ASSITED PARTIAL NEPHRECTOMY CONVERTED TO ROBOTIC ASSISTED RIGHT RADICAL NEPHRECTOMY;  Surgeon: Sebastian Ache, MD;  Location: [facility];  Service: Urology;  Laterality: Right;  . Robotic assisted laparoscopic lysis of adhesion  01/13/2013    Procedure: ROBOTIC ASSISTED LAPAROSCOPIC LYSIS OF ADHESION EXTENSIVE;  Surgeon: Sebastian Ache, MD;  Location: [facility];  Service: Urology;;    Family History  Problem Relation Age of Onset  . Diabetes Other   . Hypertension Other     History   Social History  . Marital Status: Single    Spouse Name: N/A    Number of Children: N/A  . Years of Education: N/A   Occupational History  . Not on file.   Social History Main Topics  . Smoking status: Current Every Day Smoker -- 0.50 packs/day    Types: Cigarettes    Last Attempt to Quit: 12/16/2012  . Smokeless tobacco: Not on file  . Alcohol Use: No     Comment: quit etoh 2011  .  Drug Use: Yes    Special: Marijuana     Comment: h/o marijuana quit 2011  . Sexually Active: Not on file   Other Topics Concern  . Not on file   Social History Narrative  . No narrative on file    Review of Systems:  All systems reviewed.  They are negative to the above problem except as previously stated.  Vital Signs: BP 120/92.  P 64  Wt 295  Physical Exam  HEENT:  Normocephalic, atraumatic. EOMI, PERRLA.  Neck: JVP is normal.  No bruits.  Lungs: clear to auscultation. No rales no wheezes.  Heart: Regular rate and rhythm. Normal S1, S2. No S3.   No significant murmurs. PMI not displaced.  Abdomen:  Supple, nontender. Normal bowel sounds. No masses. No hepatomegaly.  Extremities:   Good distal pulses throughout. No lower extremity edema.  Musculoskeletal :moving all extremities.  Neuro:   alert and oriented x3.  CN II-XII grossly intact.   Assessment and Plan:  1.  NICM  Patient is  doing well.  I would not change medicines for now.  Follow BP.   Stay active.  Checkt BMET  2.  HL  Check lipids.  He did not eat today.    F/U in 6 months.

## 2013-03-31 NOTE — Patient Instructions (Addendum)
LABS TODAY:  BMET & Lipids.  Your physician wants you to follow-up in: 6 months with Dr. Tenny Craw.  You will receive a reminder letter in the mail two months in advance. If you don't receive a letter, please call our office to schedule the follow-up appointment.

## 2013-04-13 ENCOUNTER — Other Ambulatory Visit: Payer: Self-pay

## 2013-04-20 ENCOUNTER — Ambulatory Visit: Payer: Self-pay

## 2013-04-25 ENCOUNTER — Ambulatory Visit: Payer: Self-pay | Attending: Family Medicine

## 2013-04-25 DIAGNOSIS — E119 Type 2 diabetes mellitus without complications: Secondary | ICD-10-CM

## 2013-04-25 DIAGNOSIS — G4733 Obstructive sleep apnea (adult) (pediatric): Secondary | ICD-10-CM

## 2013-04-25 DIAGNOSIS — E1165 Type 2 diabetes mellitus with hyperglycemia: Secondary | ICD-10-CM

## 2013-04-25 DIAGNOSIS — I509 Heart failure, unspecified: Secondary | ICD-10-CM

## 2013-04-25 DIAGNOSIS — I5022 Chronic systolic (congestive) heart failure: Secondary | ICD-10-CM

## 2013-04-25 MED ORDER — INSULIN NPH ISOPHANE & REGULAR (70-30) 100 UNIT/ML ~~LOC~~ SUSP: 34.0000 [IU] | Freq: Two times a day (BID) | SUBCUTANEOUS | Status: DC

## 2013-04-25 MED ORDER — FUROSEMIDE 40 MG PO TABS: 40.0000 mg | ORAL_TABLET | Freq: Two times a day (BID) | ORAL | Status: DC

## 2013-04-25 MED ORDER — LISINOPRIL 20 MG PO TABS: 20.0000 mg | ORAL_TABLET | Freq: Every morning | ORAL | Status: DC

## 2013-04-25 MED ORDER — GEMFIBROZIL 600 MG PO TABS: 600.0000 mg | ORAL_TABLET | Freq: Two times a day (BID) | ORAL | Status: DC

## 2013-04-25 MED ORDER — METOPROLOL TARTRATE 50 MG PO TABS: 50.0000 mg | ORAL_TABLET | Freq: Two times a day (BID) | ORAL | Status: DC

## 2013-04-25 NOTE — Patient Instructions (Addendum)
STop taking Metformin because of your kidney function Do not take Metformin any longer.   Hypoglycemia (Low Blood Sugar) Hypoglycemia is when the glucose (sugar) in your blood is too low. Hypoglycemia can happen for many reasons. It can happen to people with or without diabetes. Hypoglycemia can develop quickly and can be a medical emergency.  CAUSES  Having hypoglycemia does not mean that you will develop diabetes. Different causes include:  Missed or delayed meals or not enough carbohydrates eaten.  Medication overdose. This could be by accident or deliberate. If by accident, your medication may need to be adjusted or changed.  Exercise or increased activity without adjustments in carbohydrates or medications.  A nerve disorder that affects body functions like your heart rate, blood pressure and digestion (autonomic neuropathy).  A condition where the stomach muscles do not function properly (gastroparesis). Therefore, medications may not absorb properly.  The inability to recognize the signs of hypoglycemia (hypoglycemic unawareness).  Absorption of insulin  may be altered.  Alcohol consumption.  Pregnancy/menstrual cycles/postpartum. This may be due to hormones.  Certain kinds of tumors. This is very rare. SYMPTOMS   Sweating.  Hunger.  Dizziness.  Blurred vision.  Drowsiness.  Weakness.  Headache.  Rapid heart beat.  Shakiness.  Nervousness. DIAGNOSIS  Diagnosis is made by monitoring blood glucose in one or all of the following ways:  Fingerstick blood glucose monitoring.  Laboratory results. TREATMENT  If you think your blood glucose is low:  Check your blood glucose, if possible. If it is less than 70 mg/dl, take one of the following:  3-4 glucose tablets.   cup juice (prefer clear like apple).   cup "regular" soda pop.  1 cup milk.  -1 tube of glucose gel.  5-6 hard candies.  Do not over treat because your blood glucose (sugar) will  only go too high.  Wait 15 minutes and recheck your blood glucose. If it is still less than 70 mg/dl (or below your target range), repeat treatment.  Eat a snack if it is more than one hour until your next meal. Sometimes, your blood glucose may go so low that you are unable to treat yourself. You may need someone to help you. You may even pass out or be unable to swallow. This may require you to get an injection of glucagon, which raises the blood glucose. HOME CARE INSTRUCTIONS  Check blood glucose as recommended by your caregiver.  Take medication as prescribed by your caregiver.  Follow your meal plan. Do not skip meals. Eat on time.  If you are going to drink alcohol, drink it only with meals.  Check your blood glucose before driving.  Check your blood glucose before and after exercise. If you exercise longer or different than usual, be sure to check blood glucose more frequently.  Always carry treatment with you. Glucose tablets are the easiest to carry.  Always wear medical alert jewelry or carry some form of identification that states that you have diabetes. This will alert people that you have diabetes. If you have hypoglycemia, they will have a better idea on what to do. SEEK MEDICAL CARE IF:   You are having problems keeping your blood sugar at target range.  You are having frequent episodes of hypoglycemia.  You feel you might be having side effects from your medicines.  You have symptoms of an illness that is not improving after 3-4 days.  You notice a change in vision or a new problem with your vision.  SEEK IMMEDIATE MEDICAL CARE IF:   You are a family member or friend of a person whose blood glucose goes below 70 mg/dl and is accompanied by:  Confusion.  A change in mental status.  The inability to swallow.  Passing out. Document Released: 09/21/2005 Document Revised: 12/14/2011 Document Reviewed: 01/18/2012 Exit[employer] Patient Information 2014 [place],  [place]. 1800 Calorie Diet for Diabetes Meal Planning The 1800 calorie diet is designed for eating up to 1800 calories each day. Following this diet and making healthy meal choices can help improve overall health. This diet controls blood sugar (glucose) levels and can also help lower blood pressure and cholesterol. SERVING SIZES Measuring foods and serving sizes helps to make sure you are getting the right amount of food. The list below tells how big or small some common serving sizes are:  1 oz.........4 stacked dice.  3 oz........Marland KitchenDeck of cards.  1 tsp.......Marland KitchenTip of little finger.  1 tbs......Marland KitchenMarland KitchenThumb.  2 tbs.......Marland KitchenGolf ball.   cup......Marland KitchenHalf of a fist.  1 cup.......Marland KitchenA fist. GUIDELINES FOR CHOOSING FOODS The goal of this diet is to eat a variety of foods and limit calories to 1800 each day. This can be done by choosing foods that are low in calories and fat. The diet also suggests eating small amounts of food frequently. Doing this helps control your blood glucose levels so they do not get too high or too low. Each meal or snack may include a protein food source to help you feel more satisfied and to stabilize your blood glucose. Try to eat about the same amount of food around the same time each day. This includes weekend days, travel days, and days off work. Space your meals about 4 to 5 hours apart and add a snack between them if you wish.  For example, a daily food plan could include breakfast, a morning snack, lunch, dinner, and an evening snack. Healthy meals and snacks include whole grains, vegetables, fruits, lean meats, poultry, fish, and dairy products. As you plan your meals, select a variety of foods. Choose from the bread and starch, vegetable, fruit, dairy, and meat/protein groups. Examples of foods from each group and their suggested serving sizes are listed below. Use measuring cups and spoons to become familiar with what a healthy portion looks like. Bread and Starch Each  serving equals 15 grams of carbohydrates.  1 slice bread.   bagel.   cup cold cereal (unsweetened).   cup hot cereal or mashed potatoes.  1 small potato (size of a computer mouse).   cup cooked pasta or rice.   English muffin.  1 cup broth-based soup.  3 cups of popcorn.  4 to 6 whole-wheat crackers.   cup cooked beans, peas, or corn. Vegetable Each serving equals 5 grams of carbohydrates.   cup cooked vegetables.  1 cup raw vegetables.   cup tomato or vegetable juice. Fruit Each serving equals 15 grams of carbohydrates.  1 small apple or orange.  1 cup watermelon or strawberries.   cup applesauce (no sugar added).  2 tbs raisins.   banana.   cup canned fruit, packed in water, its own juice, or sweetened with a sugar substitute.   cup unsweetened fruit juice. Dairy Each serving equals 12 to 15 grams of carbohydrates.  1 cup fat-free milk.  6 oz artificially sweetened yogurt or plain yogurt.  1 cup low-fat buttermilk.  1 cup soy milk.  1 cup almond milk. Meat/Protein  1 large egg.  2 to 3 oz meat, poultry,  or fish.   cup low-fat cottage cheese.  1 tbs peanut butter.  1 oz low-fat cheese.   cup tuna in water.   cup tofu. Fat  1 tsp oil.  1 tsp trans-fat-free margarine.  1 tsp butter.  1 tsp mayonnaise.  2 tbs avocado.  1 tbs salad dressing.  1 tbs cream cheese.  2 tbs sour cream. SAMPLE 1800 CALORIE DIET PLAN Breakfast   cup unsweetened cereal (1 carb serving).  1 cup fat-free milk (1 carb serving).  1 slice whole-wheat toast (1 carb serving).   small banana (1 carb serving).  1 scrambled egg.  1 tsp trans-fat-free margarine. Lunch  Tuna sandwich.  2 slices whole-wheat bread (2 carb servings).   cup canned tuna in water, drained.  1 tbs reduced fat mayonnaise.  1 stalk celery, chopped.  2 slices tomato.  1 lettuce leaf.  1 cup carrot sticks.  24 to 30 seedless grapes (2 carb  servings).  6 oz light yogurt (1 carb serving). Afternoon Snack  3 graham cracker squares (1 carb serving).  Fat-free milk, 1 cup (1 carb serving).  1 tbs peanut butter. Dinner  3 oz salmon, broiled with 1 tsp oil.  1 cup mashed potatoes (2 carb servings) with 1 tsp trans-fat-free margarine.  1 cup fresh or frozen green beans.  1 cup steamed asparagus.  1 cup fat-free milk (1 carb serving). Evening Snack  3 cups air-popped popcorn (1 carb serving).  2 tbs parmesan cheese sprinkled on top. MEAL PLAN Use this worksheet to help you make a daily meal plan based on the 1800 calorie diet suggestions. If you are using this plan to help you control your blood glucose, you may interchange carbohydrate-containing foods (dairy, starches, and fruits). Select a variety of fresh foods of varying colors and flavors. The total amount of carbohydrate in your meals or snacks is more important than making sure you include all of the food groups every time you eat. Choose from the following foods to build your day's meals:  8 Starches.  4 Vegetables.  3 Fruits.  2 Dairy.  6 to 7 oz Meat/Protein.  Up to 4 Fats. Your dietician can use this worksheet to help you decide how many servings and which types of foods are right for you. BREAKFAST Food Group and Servings / Food Choice Starch ________________________________________________________ Dairy _________________________________________________________ Fruit _________________________________________________________ Meat/Protein __________________________________________________ Fat ___________________________________________________________ LUNCH Food Group and Servings / Food Choice Starch ________________________________________________________ Meat/Protein __________________________________________________ Vegetable _____________________________________________________ Fruit  _________________________________________________________ Dairy _________________________________________________________ Fat ___________________________________________________________ Aura Fey Food Group and Servings / Food Choice Starch ________________________________________________________ Meat/Protein __________________________________________________ Fruit __________________________________________________________ Dairy _________________________________________________________ Laural Golden Food Group and Servings / Food Choice Starch _________________________________________________________ Meat/Protein ___________________________________________________ Dairy __________________________________________________________ Vegetable ______________________________________________________ Fruit ___________________________________________________________ Fat ____________________________________________________________ Lollie Sails Food Group and Servings / Food Choice Fruit __________________________________________________________ Meat/Protein ___________________________________________________ Dairy __________________________________________________________ Starch _________________________________________________________ DAILY TOTALS Starch ____________________________ Vegetable _________________________ Fruit _____________________________ Dairy _____________________________ Meat/Protein______________________ Fat _______________________________ Document Released: 04/13/2005 Document Revised: 12/14/2011 Document Reviewed: 08/07/2011 Exit[employer] Patient Information 2014 [employer]. Blood Sugar Monitoring, Adult GLUCOSE METERS FOR SELF-MONITORING OF BLOOD GLUCOSE  It is important to be able to correctly measure your blood sugar (glucose). You can use a blood glucose monitor (a small battery-operated device) to check your glucose level at any time. This allows you and your  caregiver to monitor your diabetes and to determine how well your treatment plan is working. The process of monitoring your blood glucose with a glucose meter is called self-monitoring of blood glucose (SMBG). When people with  diabetes control their blood sugar, they have better health. To test for glucose with a typical glucose meter, place the disposable strip in the meter. Then place a small sample of blood on the "test strip." The test strip is coated with chemicals that combine with glucose in blood. The meter measures how much glucose is present. The meter displays the glucose level as a number. Several new models can record and store a number of test results. Some models can connect to personal computers to store test results or print them out.  Newer meters are often easier to use than older models. Some meters allow you to get blood from places other than your fingertip. Some new models have automatic timing, error codes, signals, or barcode readers to help with proper adjustment (calibration). Some meters have a large display screen or spoken instructions for people with visual impairments.  INSTRUCTIONS FOR USING GLUCOSE METERS  Wash your hands with soap and warm water, or clean the area with alcohol. Dry your hands completely.  Prick the side of your fingertip with a lancet (a sharp-pointed tool used by hand).  Hold the hand down and gently milk the finger until a small drop of blood appears. Catch the blood with the test strip.  Follow the instructions for inserting the test strip and using the SMBG meter. Most meters require the meter to be turned on and the test strip to be inserted before applying the blood sample.  Record the test result.  Read the instructions carefully for both the meter and the test strips that go with it. Meter instructions are found in the user manual. Keep this manual to help you solve any problems that may arise. Many meters use "error codes" when there is a  problem with the meter, the test strip, or the blood sample on the strip. You will need the manual to understand these error codes and fix the problem.  New devices are available such as laser lancets and meters that can test blood taken from "alternative sites" of the body, other than fingertips. However, you should use standard fingertip testing if your glucose changes rapidly. Also, use standard testing if:  You have eaten, exercised, or taken insulin in the past 2 hours.  You think your glucose is low.  You tend to not feel symptoms of low blood glucose (hypoglycemia).  You are ill or under stress.  Clean the meter as directed by the manufacturer.  Test the meter for accuracy as directed by the manufacturer.  Take your meter with you to your caregiver's office. This way, you can test your glucose in front of your caregiver to make sure you are using the meter correctly. Your caregiver can also take a sample of blood to test using a routine lab method. If values on the glucose meter are close to the lab results, you and your caregiver will see that your meter is working well and you are using good technique. Your caregiver will advise you about what to do if the results do not match. FREQUENCY OF TESTING  Your caregiver will tell you how often you should check your blood glucose. This will depend on your type of diabetes, your current level of diabetes control, and your types of medicines. The following are general guidelines, but your care plan may be different. Record all your readings and the time of day you took them for review with your caregiver.   Diabetes type 1.  When you are using insulin with  good diabetic control (either multiple daily injections or via a pump), you should check your glucose 4 times a day.  If your diabetes is not well controlled, you may need to monitor more frequently, including before meals and 2 hours after meals, at bedtime, and occasionally between 2 a.m.  and 3 a.m.  You should always check your glucose before a dose of insulin or before changing the rate on your insulin pump.  Diabetes type 2.  Guidelines for SMBG in diabetes type 2 are not as well defined.  If you are on insulin, follow the guidelines above.  If you are on medicines, but not insulin, and your glucose is not well controlled, you should test at least twice daily.  If you are not on insulin, and your diabetes is controlled with medicines or diet alone, you should test at least once daily, usually before breakfast.  A weekly profile will help your caregiver advise you on your care plan. The week before your visit, check your glucose before a meal and 2 hours after a meal at least daily. You may want to test before and after a different meal each day so you and your caregiver can tell how well controlled your blood sugars are throughout the course of a 24 hour period.  Gestational diabetes (diabetes during pregnancy).  Frequent testing is often necessary. Accurate timing is important.  If you are not on insulin, check your glucose 4 times a day. Check it before breakfast and 1 hour after the start of each meal.  If you are on insulin, check your glucose 6 times a day. Check it before each meal and 1 hour after the first bite of each meal.  General guidelines.  More frequent testing is required at the start of insulin treatment. Your caregiver will instruct you.  Test your glucose any time you suspect you have low blood sugar (hypoglycemia).  You should test more often when you change medicines, when you have unusual stress or illness, or in other unusual circumstances. OTHER THINGS TO KNOW ABOUT GLUCOSE METERS  Measurement Range. Most glucose meters are able to read glucose levels over a broad range of values from as low as 0 to as high as 600 mg/dL. If you get an extremely high or low reading from your meter, you should first confirm it with another reading. Report very  high or very low readings to your caregiver.  Whole Blood Glucose versus Plasma Glucose. Some older home glucose meters measure glucose in your whole blood. In a lab or when using some newer home glucose meters, the glucose is measured in your plasma (one component of blood). The difference can be important. It is important for you and your caregiver to know whether your meter gives its results as "whole blood equivalent" or "plasma equivalent."  Display of High and Low Glucose Values. Part of learning how to operate a meter is understanding what the meter results mean. Know how high and low glucose concentrations are displayed on your meter.  Factors that Affect Glucose Meter Performance. The accuracy of your test results depends on many factors and varies depending on the brand and type of meter. These factors include:  Low red blood cell count (anemia).  Substances in your blood (such as uric acid, vitamin C, and others).  Environmental factors (temperature, humidity, altitude).  Name-brand versus generic test strips.  Calibration. Make sure your meter is set up properly. It is a good idea to do a calibration test  with a control solution recommended by the manufacturer of your meter whenever you begin using a fresh bottle of test strips. This will help verify the accuracy of your meter.  Improperly stored, expired, or defective test strips. Keep your strips in a dry place with the lid on.  Soiled meter.  Inadequate blood sample. NEW TECHNOLOGIES FOR GLUCOSE TESTING Alternative site testing Some glucose meters allow testing blood from alternative sites. These include the:  Upper arm.  Forearm.  Base of the thumb.  Thigh. Sampling blood from alternative sites may be desirable. However, it may have some limitations. Blood in the fingertips show changes in glucose levels more quickly than blood in other parts of the body. This means that alternative site test results may be different  from fingertip test results, not because of the meter's ability to test accurately, but because the actual glucose concentration can be different.  Continuous Glucose Monitoring Devices to measure your blood glucose continuously are available, and others are in development. These methods can be more expensive than self-monitoring with a glucose meter. However, it is uncertain how effective and reliable these devices are. Your caregiver will advise you if this approach makes sense for you. IF BLOOD SUGARS ARE CONTROLLED, PEOPLE WITH DIABETES REMAIN HEALTHIER.  SMBG is an important part of the treatment plan of patients with diabetes mellitus. Below are reasons for using SMBG:   It confirms that your glucose is at a specific, healthy level.  It detects hypoglycemia and severe hyperglycemia.  It allows you and your caregiver to make adjustments in response to changes in lifestyle for individuals requiring medicine.  It determines the need for starting insulin therapy in temporary diabetes that happens during pregnancy (gestational diabetes). Document Released: 09/24/2003 Document Revised: 12/14/2011 Document Reviewed: 01/15/2011 [facility] Patient Information 2014 [place], [place]. Chronic Kidney Disease Chronic kidney disease occurs when the kidneys are damaged over a long period. The kidneys are two organs that lie on either side of the spine between the middle of the back and the front of the abdomen. The kidneys:   Remove wastes and extra water from the blood.   Produce important hormones. These help keep bones strong, regulate blood pressure, and help create red blood cells.   Balance the fluids and chemicals in the blood and tissues. A small amount of kidney damage may not cause problems, but a large amount of damage may make it difficult or impossible for the kidneys to work the way they should. If steps are not taken to slow down the kidney damage or stop it from getting worse, the kidneys  may stop working permanently. Most of the time, chronic kidney disease does not go away. However, it can often be controlled, and those with the disease can usually live normal lives. CAUSES  The most common causes of chronic kidney disease are diabetes and high blood pressure (hypertension). Chronic kidney disease may also be caused by:   Diseases that cause kidneys' filters to become inflamed.   Diseases that affect the immune system.   Genetic diseases.   Medicines that damage the kidneys, such as anti-inflammatory medicines.  Poisoning or exposure to toxic substances.   A reoccurring kidney or urinary infection.   A problem with urine flow. This may be caused by:   Cancer.   Kidney stones.   An enlarged prostate in males. SYMPTOMS  Because the kidney damage in chronic kidney disease occurs slowly, symptoms develop slowly and may not be obvious until the kidney damage  becomes severe. A person may have a kidney disease for years without showing any symptoms. Symptoms can include:   Swelling (edema) of the legs, ankles, or feet.   Tiredness (lethargy).   Nausea or vomiting.   Confusion.   Problems with urination, such as:   Decreased urine production.   Frequent urination, especially at night.   Frequent accidents in children who are potty trained.   Muscle twitches and cramps.   Shortness of breath.  Weakness.   Persistent itchiness.   Loss of appetite.  Metallic taste in the mouth.  Trouble sleeping.  Slowed development in children.  Short stature in children. DIAGNOSIS  Chronic kidney disease may be detected and diagnosed by tests, including blood, urine, imaging, or kidney biopsy tests.  TREATMENT  Most chronic kidney diseases cannot be cured. Treatment usually involves relieving symptoms and preventing or slowing the progression of the disease. Treatment may include:   A special diet. You may need to avoid alcohol and foods  thatare salty and high in potassium.   Medicines. These may:   Lower blood pressure.   Relieve anemia.   Relieve swelling.   Protect the bones. HOME CARE INSTRUCTIONS   Follow your prescribed diet.   Only take over-the-counter or prescription medicines as directed by your caregiver.  Do not take any new medicines (prescription, over-the-counter, or nutritional supplements) unless approved by your caregiver. Many medicines can worsen your kidney damage or need to have the dose adjusted.   Quit smoking if you are a smoker. Talk to your caregiver about a smoking cessation program.   Keep all follow-up appointments as directed by your caregiver. SEEK IMMEDIATE MEDICAL CARE IF:  Your symptoms get worse or you develop new symptoms.   You develop symptoms of end-stage kidney disease. These include:   Headaches.   Abnormally dark or light skin.   Numbness in the hands or feet.   Easy bruising.   Frequent hiccups.   Menstruation stops.   You have a fever.   You have decreased urine production.   You havepain or bleeding when urinating. MAKE SURE YOU:  Understand these instructions.  Will watch your condition.  Will get help right away if you are not doing well or get worse. FOR MORE INFORMATION  American Association of Kidney Patients: ResidentialShow.is National Kidney Foundation: www.kidney.org American Kidney Fund: FightingMatch.com.ee [facility]: www.lifeoptions.org and www.kidneyschool.org Document Released: 06/30/2008 Document Revised: 09/07/2012 Document Reviewed: 05/20/2012 [facility] Patient Information 2014 [place], [place].

## 2013-04-25 NOTE — Progress Notes (Unsigned)
Patient ID: Jeffrey [name], male   DOB: 09/21/1976, 37 y.o.   MRN: [id number]  CC:  Follow up   HPI: Pt is presenting to follow up.  Pt says that he is out of lisinopril for the last 2 months.  He is using his home CPAP.  He is reporting that he is getting BS readings from 90-150 and no BS readings less than 90.   No CP and No SOB reported.   Allergies  Allergen Reactions  . Aspirin Shortness Of Breath and Rash    Rash, itching, burning sensation  (Patient reports he tolerates other NSAIDS)   . Tomato Rash   Past Medical History  Diagnosis Date  . Diabetes mellitus   . Hypertension   . Obesity   . History of gout   . History of kidney stones   . Obstructive sleep apnea     USES C-PAP  . Renal neoplasm     RT  . CHF (congestive heart failure)     EF 20-25%   Current Outpatient Prescriptions on File Prior to Visit  Medication Sig Dispense Refill  . colchicine 0.6 MG tablet Take 0.6 mg by mouth daily as needed (for gout).      . [DISCONTINUED] cetirizine (ZYRTEC ALLERGY) 10 MG tablet Take 1 tablet (10 mg total) by mouth daily.  30 tablet  1   No current facility-administered medications on file prior to visit.   Family History  Problem Relation Age of Onset  . Diabetes Other   . Hypertension Other    History   Social History  . Marital Status: Single    Spouse Name: N/A    Number of Children: N/A  . Years of Education: N/A   Occupational History  . Not on file.   Social History Main Topics  . Smoking status: Current Every Day Smoker -- 0.50 packs/day    Types: Cigarettes    Last Attempt to Quit: 12/16/2012  . Smokeless tobacco: Not on file  . Alcohol Use: No     Comment: quit etoh 2011  . Drug Use: Yes    Special: Marijuana     Comment: h/o marijuana quit 2011  . Sexually Active: Not on file   Other Topics Concern  . Not on file   Social History Narrative  . No narrative on file    Review of Systems  Constitutional: Negative for fever, chills,  diaphoresis, activity change, appetite change and fatigue.  HENT: Negative for ear pain, nosebleeds, congestion, facial swelling, rhinorrhea, neck pain, neck stiffness and ear discharge.   Eyes: Negative for pain, discharge, redness, itching and visual disturbance.  Respiratory: Negative for cough, choking, chest tightness, shortness of breath, wheezing and stridor.   Cardiovascular: Negative for chest pain, palpitations and leg swelling.  Gastrointestinal: Negative for abdominal distention.  Genitourinary: Negative for dysuria, urgency, frequency, hematuria, flank pain, decreased urine volume, difficulty urinating and dyspareunia.  Musculoskeletal: Negative for back pain, joint swelling, arthralgias and gait problem.  Neurological: Negative for dizziness, tremors, seizures, syncope, facial asymmetry, speech difficulty, weakness, light-headedness, numbness and headaches.  Hematological: Negative for adenopathy. Does not bruise/bleed easily.  Psychiatric/Behavioral: Negative for hallucinations, behavioral problems, confusion, dysphoric mood, decreased concentration and agitation.    Objective:  There were no vitals filed for this visit.  Physical Exam  Constitutional: Appears well-developed and well-nourished. No distress.  HENT: Normocephalic. External right and left ear normal. Oropharynx is clear and moist.  Eyes: Conjunctivae and EOM are normal. PERRLA, no  scleral icterus.  Neck: Normal ROM. Neck supple. No JVD. No tracheal deviation. No thyromegaly.  CVS: RRR, S1/S2 +, no murmurs, no gallops, no carotid bruit.  Pulmonary: Effort and breath sounds normal, no stridor, rhonchi, wheezes, rales.  Abdominal: Soft. BS +,  no distension, tenderness, rebound or guarding.  Musculoskeletal: Normal range of motion. No edema and no tenderness.  Lymphadenopathy: No lymphadenopathy noted, cervical, inguinal. Neuro: Alert. Normal reflexes, muscle tone coordination. No cranial nerve deficit. Skin: Skin  is warm and dry. No rash noted. Not diaphoretic. No erythema. No pallor.  Psychiatric: Normal mood and affect. Behavior, judgment, thought content normal.   Lab Results  Component Value Date   WBC 7.5 03/08/2013   HGB 13.3 03/08/2013   HCT 39.5 03/08/2013   MCV 83.0 03/08/2013   PLT 365 03/08/2013   Lab Results  Component Value Date   CREATININE 1.8* 03/31/2013   BUN 21 03/31/2013   NA 137 03/31/2013   K 3.9 03/31/2013   CL 107 03/31/2013   CO2 21 03/31/2013    Lab Results  Component Value Date   HGBA1C 13.2* 11/10/2011   Lipid Panel     Component Value Date/Time   CHOL 142 03/31/2013 0849   TRIG 144.0 03/31/2013 0849   HDL 25.80* 03/31/2013 0849   CHOLHDL 6 03/31/2013 0849   VLDL 28.8 03/31/2013 0849   LDLCALC 87 03/31/2013 0849       Assessment and plan:   Patient Active Problem List   Diagnosis Date Noted  . Constipation 11/14/2011  . BRBPR (bright red blood per rectum) 11/13/2011  . Leukocytosis 11/11/2011  . Gout attack 11/10/2011  . Systolic CHF, chronic 10/23/2011  . Obesities, morbid 10/23/2011  . OSA (obstructive sleep apnea) 10/23/2011  . Diabetes mellitus type 2, uncontrolled 10/22/2011  . Chest pain 10/22/2011  . Cardiomyopathy 10/22/2011  . HYPERLIPIDEMIA, MILD 12/25/2010  . HYPERTENSION, BENIGN ESSENTIAL 12/25/2010  . OBESITY 12/23/2010  . CHF 12/23/2010  . DIABETES MELLITUS, TYPE II, BORDERLINE 12/23/2010   Discontinue Metformin because of worsening renal function - it is not safe Continue to monitor BS closely  Reviewed recent labs with patient today  Refilled medications except metformin  RTC in 3 months  The patient was given clear instructions to go to ER or return to medical center if symptoms don't improve, worsen or new problems develop.  The patient verbalized understanding.  The patient was told to call to get any lab results if not heard anything in the next week.    Rodney Langton, MD, CDE, FAAFP [facility] [place], [place]

## 2013-04-26 ENCOUNTER — Ambulatory Visit: Payer: Self-pay

## 2013-06-18 ENCOUNTER — Emergency Department (HOSPITAL_COMMUNITY)
Admission: EM | Admit: 2013-06-18 | Discharge: 2013-06-18 | Disposition: A | Discharge status: Home / Self Care | Payer: Self-pay | Attending: Emergency Medicine | Admitting: Emergency Medicine

## 2013-06-18 ENCOUNTER — Encounter (HOSPITAL_COMMUNITY): Payer: Self-pay | Admitting: Emergency Medicine

## 2013-06-18 DIAGNOSIS — IMO0002 Reserved for concepts with insufficient information to code with codable children: Secondary | ICD-10-CM | POA: Insufficient documentation

## 2013-06-18 DIAGNOSIS — Z85528 Personal history of other malignant neoplasm of kidney: Secondary | ICD-10-CM | POA: Insufficient documentation

## 2013-06-18 DIAGNOSIS — F17200 Nicotine dependence, unspecified, uncomplicated: Secondary | ICD-10-CM | POA: Insufficient documentation

## 2013-06-18 DIAGNOSIS — T63461A Toxic effect of venom of wasps, accidental (unintentional), initial encounter: Secondary | ICD-10-CM | POA: Insufficient documentation

## 2013-06-18 DIAGNOSIS — Y929 Unspecified place or not applicable: Secondary | ICD-10-CM | POA: Insufficient documentation

## 2013-06-18 DIAGNOSIS — Z79899 Other long term (current) drug therapy: Secondary | ICD-10-CM | POA: Insufficient documentation

## 2013-06-18 DIAGNOSIS — E119 Type 2 diabetes mellitus without complications: Secondary | ICD-10-CM | POA: Insufficient documentation

## 2013-06-18 DIAGNOSIS — Z87442 Personal history of urinary calculi: Secondary | ICD-10-CM | POA: Insufficient documentation

## 2013-06-18 DIAGNOSIS — T6391XA Toxic effect of contact with unspecified venomous animal, accidental (unintentional), initial encounter: Secondary | ICD-10-CM | POA: Insufficient documentation

## 2013-06-18 DIAGNOSIS — E669 Obesity, unspecified: Secondary | ICD-10-CM | POA: Insufficient documentation

## 2013-06-18 DIAGNOSIS — Z794 Long term (current) use of insulin: Secondary | ICD-10-CM | POA: Insufficient documentation

## 2013-06-18 DIAGNOSIS — Y939 Activity, unspecified: Secondary | ICD-10-CM | POA: Insufficient documentation

## 2013-06-18 DIAGNOSIS — G4733 Obstructive sleep apnea (adult) (pediatric): Secondary | ICD-10-CM | POA: Insufficient documentation

## 2013-06-18 DIAGNOSIS — I10 Essential (primary) hypertension: Secondary | ICD-10-CM | POA: Insufficient documentation

## 2013-06-18 DIAGNOSIS — I509 Heart failure, unspecified: Secondary | ICD-10-CM | POA: Insufficient documentation

## 2013-06-18 DIAGNOSIS — M109 Gout, unspecified: Secondary | ICD-10-CM | POA: Insufficient documentation

## 2013-06-18 MED ORDER — FAMOTIDINE 20 MG PO TABS: 20.0000 mg | ORAL_TABLET | Freq: Two times a day (BID) | ORAL | Status: DC

## 2013-06-18 MED ORDER — PREDNISONE 50 MG PO TABS: ORAL_TABLET | ORAL | Status: DC

## 2013-06-18 MED ORDER — EPINEPHRINE 0.3 MG/0.3ML IJ SOAJ: 0.3000 mg | INTRAMUSCULAR | Status: DC | PRN

## 2013-06-18 MED ORDER — SODIUM CHLORIDE 0.9 % IV BOLUS (SEPSIS)
1000.0000 mL | Freq: Once | INTRAVENOUS | Status: AC
  Administered 2013-06-18: 1000 mL via INTRAVENOUS

## 2013-06-18 MED ORDER — METHYLPREDNISOLONE SODIUM SUCC 125 MG IJ SOLR
125.0000 mg | Freq: Once | INTRAMUSCULAR | Status: AC
  Administered 2013-06-18: 125 mg via INTRAVENOUS
  Filled 2013-06-18: qty 2

## 2013-06-18 MED ORDER — FAMOTIDINE IN NACL 20-0.9 MG/50ML-% IV SOLN
20.0000 mg | Freq: Once | INTRAVENOUS | Status: AC
  Administered 2013-06-18: 20 mg via INTRAVENOUS
  Filled 2013-06-18: qty 50

## 2013-06-18 MED ORDER — DIPHENHYDRAMINE HCL 50 MG/ML IJ SOLN
25.0000 mg | Freq: Once | INTRAMUSCULAR | Status: AC
  Administered 2013-06-18: 25 mg via INTRAVENOUS
  Filled 2013-06-18: qty 1

## 2013-06-18 MED ORDER — DIPHENHYDRAMINE HCL 25 MG PO TABS: 25.0000 mg | ORAL_TABLET | Freq: Four times a day (QID) | ORAL | Status: DC

## 2013-06-18 NOTE — ED Notes (Signed)
Md at bedside

## 2013-06-18 NOTE — ED Notes (Signed)
Pt stung by bee to left hand at 1300 today. Swelling and redness noted to hand and lower left arm. Pt denies SOB, swelling to tongue. Speaking in complete sentences. 99% RA.

## 2013-06-18 NOTE — ED Provider Notes (Signed)
CSN: [id number]     Arrival date & time 06/18/13  1343 History   First MD Initiated Contact with Patient 06/18/13 1405     Chief Complaint  Patient presents with  . Insect Bite   (Consider location/radiation/quality/duration/timing/severity/associated sxs/prior Treatment) HPI Comments: Patient presents with pain and swelling to his left wrist and hand after he was stung by a bee about one hour ago. Reports allergic reaction the past with tongue and throat swelling but none today. Denies any chest pain or difficulty breathing. Denies any difficulty swallowing. He is a diabetic and has a history of hypertension. He was given an EpiPen in the past but he did not fill his prescription. He denies any wheezing, cough, congestion or difficulty breathing.  The history is provided by the patient.    Past Medical History  Diagnosis Date  . Diabetes mellitus   . Hypertension   . Obesity   . History of gout   . History of kidney stones   . Obstructive sleep apnea     USES C-PAP  . Renal neoplasm     RT  . CHF (congestive heart failure)     EF 20-25%   Past Surgical History  Procedure Laterality Date  . Appendectomy    . Robotic assited partial nephrectomy Right 01/13/2013    Procedure: ROBOTIC ASSITED PARTIAL NEPHRECTOMY CONVERTED TO ROBOTIC ASSISTED RIGHT RADICAL NEPHRECTOMY;  Surgeon: Sebastian Ache, MD;  Location: [facility];  Service: Urology;  Laterality: Right;  . Robotic assisted laparoscopic lysis of adhesion  01/13/2013    Procedure: ROBOTIC ASSISTED LAPAROSCOPIC LYSIS OF ADHESION EXTENSIVE;  Surgeon: Sebastian Ache, MD;  Location: [facility];  Service: Urology;;   Family History  Problem Relation Age of Onset  . Diabetes Other   . Hypertension Other    History  Substance Use Topics  . Smoking status: Current Every Day Smoker -- 0.50 packs/day    Types: Cigarettes    Last Attempt to Quit: 12/16/2012  . Smokeless tobacco: Not on file  . Alcohol Use: No     Comment: quit etoh 2011     Review of Systems  Constitutional: Negative for fever, activity change and appetite change.  HENT: Negative for congestion and rhinorrhea.   Respiratory: Negative for cough, chest tightness and shortness of breath.   Gastrointestinal: Negative for nausea, vomiting and abdominal pain.  Genitourinary: Negative for dysuria and hematuria.  Musculoskeletal: Negative for back pain.  Skin: Positive for rash.  Neurological: Negative for dizziness, weakness and headaches.  A complete 10 system review of systems was obtained and all systems are negative except as noted in the HPI and PMH.    Allergies  Aspirin; Bee venom; and Tomato  Home Medications   Current Outpatient Rx  Name  Route  Sig  Dispense  Refill  . colchicine 0.6 MG tablet   Oral   Take 0.6 mg by mouth daily as needed (for gout).         . furosemide (LASIX) 40 MG tablet   Oral   Take 40 mg by mouth 2 (two) times daily.         [name] gemfibrozil (LOPID) 600 MG tablet   Oral   Take 600 mg by mouth 2 (two) times daily before a meal.         . insulin NPH-regular (NOVOLIN 70/30) (70-30) 100 UNIT/ML injection   Subcutaneous   Inject 34 Units into the skin 2 (two) times daily with a meal.         .  lisinopril (PRINIVIL,ZESTRIL) 20 MG tablet   Oral   Take 20 mg by mouth daily.         . metoprolol (LOPRESSOR) 50 MG tablet   Oral   Take 50 mg by mouth 2 (two) times daily.         . diphenhydrAMINE (BENADRYL) 25 MG tablet   Oral   Take 1 tablet (25 mg total) by mouth every 6 (six) hours.   20 tablet   0   . EPINEPHrine (EPIPEN) 0.3 mg/0.3 mL SOAJ injection   Intramuscular   Inject 0.3 mLs (0.3 mg total) into the muscle as needed (as needed for severe allergy).   1 Device   0   . famotidine (PEPCID) 20 MG tablet   Oral   Take 1 tablet (20 mg total) by mouth 2 (two) times daily.   30 tablet   0   . predniSONE (DELTASONE) 50 MG tablet      1 tablet PO daily   5 tablet   0    BP 142/94  Pulse  66  Temp(Src) 97.9 F (36.6 C) (Oral)  Resp 16  SpO2 97% Physical Exam  Constitutional: He is oriented to person, place, and time. He appears well-developed and well-nourished. No distress.  HENT:  Head: Normocephalic and atraumatic.  Mouth/Throat: Oropharynx is clear and moist. No oropharyngeal exudate.  Uvula midline. No tongue elevation. Oropharynx clear  Eyes: Conjunctivae and EOM are normal. Pupils are equal, round, and reactive to light.  Neck: Normal range of motion. Neck supple.  Cardiovascular: Normal rate, regular rhythm and normal heart sounds.   No murmur heard. Pulmonary/Chest: Effort normal and breath sounds normal. No respiratory distress. He has no wheezes.  Abdominal: Soft. There is no tenderness. There is no rebound and no guarding.  Musculoskeletal: Normal range of motion. He exhibits edema and tenderness.  Insect bite to left distal forearm. Swelling and erythema to dorsum of left hand and wrist. Full range of motion of fingers. + Radial pulse  Neurological: He is alert and oriented to person, place, and time. No cranial nerve deficit. He exhibits normal muscle tone. Coordination normal.  Skin: Skin is warm. Rash noted.    ED Course  Procedures (including critical care time) Labs Review Labs Reviewed - No data to display Imaging Review No results found.  MDM   1. Bee sting reaction, initial encounter    Allergic reaction to bee sting. Vital stable. No distress. No airway issues.  Patient will be given antihistamines and steroids. Elevate extremity and apply ice. Patient instructed on proper use of EpiPen and need to fill this prescription. She understands for severe allergic reactions only with difficulty breathing and would not be appropriate for today's sting.  Patient observed in ED for 2 hours with improvement in his swelling and redness to his arm. No difficulty breathing, wheezing, chest pain or drooling. Patient will be discharged on steroids and  antihistamines. He is given epinephrine pen with instructions. Return precautions discussed.    Glynn Octave, MD 06/18/13 [id number]

## 2013-06-18 NOTE — ED Notes (Addendum)
Patient presents to ED after being stung by bee about an hour ago. Left arm red swollen. 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 Patient denies any breathing difficulty.

## 2013-07-26 ENCOUNTER — Encounter: Payer: Self-pay | Admitting: Internal Medicine

## 2013-07-26 ENCOUNTER — Ambulatory Visit: Payer: Self-pay | Attending: Internal Medicine | Admitting: Internal Medicine

## 2013-07-26 VITALS — BP 152/100 | HR 59 | Temp 98.9°F | Resp 16 | Ht 69.0 in | Wt 293.0 lb

## 2013-07-26 DIAGNOSIS — M109 Gout, unspecified: Secondary | ICD-10-CM

## 2013-07-26 DIAGNOSIS — I509 Heart failure, unspecified: Secondary | ICD-10-CM | POA: Insufficient documentation

## 2013-07-26 DIAGNOSIS — I5022 Chronic systolic (congestive) heart failure: Secondary | ICD-10-CM | POA: Insufficient documentation

## 2013-07-26 DIAGNOSIS — IMO0001 Reserved for inherently not codable concepts without codable children: Secondary | ICD-10-CM | POA: Insufficient documentation

## 2013-07-26 DIAGNOSIS — E785 Hyperlipidemia, unspecified: Secondary | ICD-10-CM

## 2013-07-26 DIAGNOSIS — E1165 Type 2 diabetes mellitus with hyperglycemia: Secondary | ICD-10-CM

## 2013-07-26 DIAGNOSIS — I10 Essential (primary) hypertension: Secondary | ICD-10-CM | POA: Insufficient documentation

## 2013-07-26 LAB — HEMOGLOBIN A1C
Hgb A1c MFr Bld: 6.3 % — ABNORMAL HIGH
Mean Plasma Glucose: 134 mg/dL — ABNORMAL HIGH

## 2013-07-26 NOTE — Progress Notes (Signed)
Patient ID: Jeffrey [name], male   DOB: 01-Mar-1976, 37 y.o.   MRN: [id number]  CC: follow up  HPI: 37 year old male with past medical history systolic heart failure (last 2-D echo in 2013 with ejection fraction 45-50%), dyslipidemia, diabetes who presented to clinic for followup. Patient feels great at this point. No complaints of chest pain or shortness of breath. No complaints of blurry vision or headaches. He reported he didn't take blood pressure medication this morning but he will after this visit. He usually takes medications around 10 AM. No abdominal pain, nausea or vomiting.  Allergies  Allergen Reactions  . Aspirin Shortness Of Breath and Rash    Rash, itching, burning sensation  (Patient reports he tolerates other NSAIDS)   . Bee Venom Hives and Swelling  . Tomato Rash   Past Medical History  Diagnosis Date  . Diabetes mellitus   . Hypertension   . Obesity   . History of gout   . History of kidney stones   . Obstructive sleep apnea     USES C-PAP  . Renal neoplasm     RT  . CHF (congestive heart failure)     EF 20-25%   Current Outpatient Prescriptions on File Prior to Visit  Medication Sig Dispense Refill  . colchicine 0.6 MG tablet Take 0.6 mg by mouth daily as needed (for gout).      [name] diphenhydrAMINE (BENADRYL) 25 MG tablet Take 1 tablet (25 mg total) by mouth every 6 (six) hours.  20 tablet  0  . EPINEPHrine (EPIPEN) 0.3 mg/0.3 mL SOAJ injection Inject 0.3 mLs (0.3 mg total) into the muscle as needed (as needed for severe allergy).  1 Device  0  . famotidine (PEPCID) 20 MG tablet Take 1 tablet (20 mg total) by mouth 2 (two) times daily.  30 tablet  0  . furosemide (LASIX) 40 MG tablet Take 40 mg by mouth 2 (two) times daily.      [name] gemfibrozil (LOPID) 600 MG tablet Take 600 mg by mouth 2 (two) times daily before a meal.      . insulin NPH-regular (NOVOLIN 70/30) (70-30) 100 UNIT/ML injection Inject 34 Units into the skin 2 (two) times daily with a meal.      .  lisinopril (PRINIVIL,ZESTRIL) 20 MG tablet Take 20 mg by mouth daily.      . metoprolol (LOPRESSOR) 50 MG tablet Take 50 mg by mouth 2 (two) times daily.      . predniSONE (DELTASONE) 50 MG tablet 1 tablet PO daily  5 tablet  0  . [DISCONTINUED] cetirizine (ZYRTEC ALLERGY) 10 MG tablet Take 1 tablet (10 mg total) by mouth daily.  30 tablet  1   No current facility-administered medications on file prior to visit.   Family History  Problem Relation Age of Onset  . Diabetes Other   . Hypertension Other    History   Social History  . Marital Status: Single    Spouse Name: N/A    Number of Children: N/A  . Years of Education: N/A   Occupational History  . Not on file.   Social History Main Topics  . Smoking status: Current Every Day Smoker -- 0.50 packs/day    Types: Cigarettes    Last Attempt to Quit: 12/16/2012  . Smokeless tobacco: Not on file  . Alcohol Use: No     Comment: quit etoh 2011  . Drug Use: Yes    Special: Marijuana     Comment:  h/o marijuana quit 2011  . Sexual Activity: Not on file   Other Topics Concern  . Not on file   Social History Narrative  . No narrative on file    Review of Systems  Constitutional: Negative for fever, chills, diaphoresis, activity change, appetite change and fatigue.  HENT: Negative for ear pain, nosebleeds, congestion, facial swelling, rhinorrhea, neck pain, neck stiffness and ear discharge.   Eyes: Negative for pain, discharge, redness, itching and visual disturbance.  Respiratory: Negative for cough, choking, chest tightness, shortness of breath, wheezing and stridor.   Cardiovascular: Negative for chest pain, palpitations and leg swelling.  Gastrointestinal: Negative for abdominal distention.  Genitourinary: Negative for dysuria, urgency, frequency, hematuria, flank pain, decreased urine volume, difficulty urinating and dyspareunia.  Musculoskeletal: Negative for back pain, joint swelling, arthralgias and gait problem.   Neurological: Negative for dizziness, tremors, seizures, syncope, facial asymmetry, speech difficulty, weakness, light-headedness, numbness and headaches.  Hematological: Negative for adenopathy. Does not bruise/bleed easily.  Psychiatric/Behavioral: Negative for hallucinations, behavioral problems, confusion, dysphoric mood, decreased concentration and agitation.    Objective:   Filed Vitals:   07/26/13 1112  BP: 152/100  Pulse: 59  Temp: 98.9 F (37.2 C)  Resp: 16    Physical Exam  Constitutional: Appears well-developed and well-nourished. No distress.  HENT: Normocephalic. External right and left ear normal. Oropharynx is clear and moist.  Eyes: Conjunctivae and EOM are normal. PERRLA, no scleral icterus.  Neck: Normal ROM. Neck supple. No JVD. No tracheal deviation. No thyromegaly.  CVS: RRR, S1/S2 +, no murmurs, no gallops, no carotid bruit.  Pulmonary: Effort and breath sounds normal, no stridor, rhonchi, wheezes, rales.  Abdominal: Soft. BS +,  no distension, tenderness, rebound or guarding.  Musculoskeletal: Normal range of motion. No edema and no tenderness.  Lymphadenopathy: No lymphadenopathy noted, cervical, inguinal. Neuro: Alert. Normal reflexes, muscle tone coordination. No cranial nerve deficit. Skin: Skin is warm and dry. No rash noted. Not diaphoretic. No erythema. No pallor.  Psychiatric: Normal mood and affect. Behavior, judgment, thought content normal.   Lab Results  Component Value Date   WBC 7.5 03/08/2013   HGB 13.3 03/08/2013   HCT 39.5 03/08/2013   MCV 83.0 03/08/2013   PLT 365 03/08/2013   Lab Results  Component Value Date   CREATININE 1.8* 03/31/2013   BUN 21 03/31/2013   NA 137 03/31/2013   K 3.9 03/31/2013   CL 107 03/31/2013   CO2 21 03/31/2013    Lab Results  Component Value Date   HGBA1C 6.2 04/25/2013   Lipid Panel     Component Value Date/Time   CHOL 142 03/31/2013 0849   TRIG 144.0 03/31/2013 0849   HDL 25.80* 03/31/2013 0849   CHOLHDL 6  03/31/2013 0849   VLDL 28.8 03/31/2013 0849   LDLCALC 87 03/31/2013 0849       Assessment and plan:   Patient Active Problem List   Diagnosis Date Noted  . Preventive Health - will check A1c today 11/10/2011    Priority: Medium  . Systolic CHF, chronic 10/23/2011    Priority: Medium - on lasix, metoprolol and lisinopril - follows with [facility]   . Diabetes mellitus type 2, uncontrolled 10/22/2011    Priority: Medium - check A1c today - Continue current insulin regimen   . HYPERLIPIDEMIA, MILD 12/25/2010    Priority: Medium - Continue gemfibrozil   . HYPERTENSION, BENIGN ESSENTIAL 12/25/2010    Priority: Medium - Did not take blood pressure medication this morning.  -  We have discussed target BP range - I have advised pt to check BP regularly and to call us back if the numbers are higher than 140/90 - discussed the importance of compliance with medical therapy and diet

## 2013-07-26 NOTE — Progress Notes (Signed)
Pt is here for a f/u visit. Pt is here today to review his lab results.

## 2013-07-26 NOTE — Patient Instructions (Signed)

## 2013-07-26 NOTE — Addendum Note (Signed)
Addended by: Allayne Stack R on: 07/26/2013 11:27 AM   Modules accepted: Orders

## 2013-08-19 ENCOUNTER — Other Ambulatory Visit: Payer: Self-pay | Admitting: Emergency Medicine

## 2013-08-19 MED ORDER — INSULIN NPH ISOPHANE & REGULAR (70-30) 100 UNIT/ML ~~LOC~~ SUSP: 34.0000 [IU] | Freq: Two times a day (BID) | SUBCUTANEOUS | Status: DC

## 2014-01-16 ENCOUNTER — Other Ambulatory Visit: Payer: Self-pay | Admitting: Family Medicine

## 2014-04-05 ENCOUNTER — Encounter (HOSPITAL_COMMUNITY): Payer: Self-pay | Admitting: Emergency Medicine

## 2014-04-05 ENCOUNTER — Emergency Department (HOSPITAL_COMMUNITY): Payer: Medicaid Other

## 2014-04-05 ENCOUNTER — Inpatient Hospital Stay (HOSPITAL_COMMUNITY)
Admission: EM | Admit: 2014-04-05 | Discharge: 2014-04-08 | DRG: 292 | Disposition: A | Discharge status: Home / Self Care | Payer: Medicaid Other | Attending: Cardiology | Admitting: Cardiology

## 2014-04-05 DIAGNOSIS — Z833 Family history of diabetes mellitus: Secondary | ICD-10-CM

## 2014-04-05 DIAGNOSIS — N189 Chronic kidney disease, unspecified: Secondary | ICD-10-CM

## 2014-04-05 DIAGNOSIS — Z905 Acquired absence of kidney: Secondary | ICD-10-CM | POA: Diagnosis not present

## 2014-04-05 DIAGNOSIS — J45909 Unspecified asthma, uncomplicated: Secondary | ICD-10-CM | POA: Diagnosis present

## 2014-04-05 DIAGNOSIS — M109 Gout, unspecified: Secondary | ICD-10-CM | POA: Diagnosis present

## 2014-04-05 DIAGNOSIS — E872 Acidosis, unspecified: Secondary | ICD-10-CM | POA: Diagnosis present

## 2014-04-05 DIAGNOSIS — Z91199 Patient's noncompliance with other medical treatment and regimen due to unspecified reason: Secondary | ICD-10-CM | POA: Diagnosis not present

## 2014-04-05 DIAGNOSIS — G4733 Obstructive sleep apnea (adult) (pediatric): Secondary | ICD-10-CM | POA: Diagnosis present

## 2014-04-05 DIAGNOSIS — Z794 Long term (current) use of insulin: Secondary | ICD-10-CM

## 2014-04-05 DIAGNOSIS — F411 Generalized anxiety disorder: Secondary | ICD-10-CM | POA: Diagnosis present

## 2014-04-05 DIAGNOSIS — E119 Type 2 diabetes mellitus without complications: Secondary | ICD-10-CM | POA: Diagnosis present

## 2014-04-05 DIAGNOSIS — E669 Obesity, unspecified: Secondary | ICD-10-CM | POA: Diagnosis present

## 2014-04-05 DIAGNOSIS — Z886 Allergy status to analgesic agent status: Secondary | ICD-10-CM

## 2014-04-05 DIAGNOSIS — Z6841 Body Mass Index (BMI) 40.0 and over, adult: Secondary | ICD-10-CM

## 2014-04-05 DIAGNOSIS — I509 Heart failure, unspecified: Secondary | ICD-10-CM | POA: Diagnosis present

## 2014-04-05 DIAGNOSIS — Z9119 Patient's noncompliance with other medical treatment and regimen: Secondary | ICD-10-CM

## 2014-04-05 DIAGNOSIS — I5023 Acute on chronic systolic (congestive) heart failure: Principal | ICD-10-CM | POA: Diagnosis present

## 2014-04-05 DIAGNOSIS — N183 Chronic kidney disease, stage 3 unspecified: Secondary | ICD-10-CM | POA: Diagnosis present

## 2014-04-05 DIAGNOSIS — F1210 Cannabis abuse, uncomplicated: Secondary | ICD-10-CM | POA: Diagnosis present

## 2014-04-05 DIAGNOSIS — I10 Essential (primary) hypertension: Secondary | ICD-10-CM | POA: Diagnosis present

## 2014-04-05 DIAGNOSIS — I214 Non-ST elevation (NSTEMI) myocardial infarction: Secondary | ICD-10-CM | POA: Diagnosis not present

## 2014-04-05 DIAGNOSIS — Z85528 Personal history of other malignant neoplasm of kidney: Secondary | ICD-10-CM | POA: Diagnosis not present

## 2014-04-05 DIAGNOSIS — I200 Unstable angina: Secondary | ICD-10-CM

## 2014-04-05 DIAGNOSIS — R7989 Other specified abnormal findings of blood chemistry: Secondary | ICD-10-CM | POA: Diagnosis present

## 2014-04-05 DIAGNOSIS — Z888 Allergy status to other drugs, medicaments and biological substances status: Secondary | ICD-10-CM | POA: Diagnosis present

## 2014-04-05 DIAGNOSIS — E7800 Pure hypercholesterolemia, unspecified: Secondary | ICD-10-CM | POA: Diagnosis present

## 2014-04-05 DIAGNOSIS — N179 Acute kidney failure, unspecified: Secondary | ICD-10-CM | POA: Diagnosis present

## 2014-04-05 DIAGNOSIS — F17200 Nicotine dependence, unspecified, uncomplicated: Secondary | ICD-10-CM | POA: Diagnosis present

## 2014-04-05 DIAGNOSIS — R778 Other specified abnormalities of plasma proteins: Secondary | ICD-10-CM | POA: Diagnosis present

## 2014-04-05 HISTORY — DX: Obstructive sleep apnea (adult) (pediatric): G47.33

## 2014-04-05 HISTORY — DX: Dependence on other enabling machines and devices: Z99.89

## 2014-04-05 HISTORY — DX: Chronic kidney disease, stage 2 (mild): N18.2

## 2014-04-05 HISTORY — DX: Chronic systolic (congestive) heart failure: I50.22

## 2014-04-05 HISTORY — DX: Unspecified asthma, uncomplicated: J45.909

## 2014-04-05 HISTORY — DX: Malignant neoplasm of unspecified kidney, except renal pelvis: C64.9

## 2014-04-05 HISTORY — DX: Other specified abnormal findings of blood chemistry: R79.89

## 2014-04-05 HISTORY — DX: Morbid (severe) obesity due to excess calories: E66.01

## 2014-04-05 HISTORY — DX: Allergy status to other drugs, medicaments and biological substances: Z88.8

## 2014-04-05 HISTORY — DX: Pure hypercholesterolemia, unspecified: E78.00

## 2014-04-05 HISTORY — DX: Type 2 diabetes mellitus without complications: E11.9

## 2014-04-05 HISTORY — DX: Anxiety disorder, unspecified: F41.9

## 2014-04-05 HISTORY — DX: Other specified abnormalities of plasma proteins: R77.8

## 2014-04-05 LAB — COMPREHENSIVE METABOLIC PANEL
ALT: 21 U/L (ref 0–53)
AST: 32 U/L (ref 0–37)
Albumin: 3 g/dL — ABNORMAL LOW (ref 3.5–5.2)
Alkaline Phosphatase: 73 U/L (ref 39–117)
Anion gap: 14 (ref 5–15)
BUN: 13 mg/dL (ref 6–23)
CO2: 16 mEq/L — ABNORMAL LOW (ref 19–32)
Calcium: 9.1 mg/dL (ref 8.4–10.5)
Chloride: 108 mEq/L (ref 96–112)
Creatinine, Ser: 1.4 mg/dL — ABNORMAL HIGH (ref 0.50–1.35)
GFR calc Af Amer: 72 mL/min — ABNORMAL LOW (ref >90)
GFR calc non Af Amer: 62 mL/min — ABNORMAL LOW (ref >90)
Glucose, Bld: 102 mg/dL — ABNORMAL HIGH (ref 70–99)
POTASSIUM: 4.6 meq/L (ref 3.7–5.3)
SODIUM: 138 meq/L (ref 137–147)
TOTAL PROTEIN: 7 g/dL (ref 6.0–8.3)
Total Bilirubin: 0.7 mg/dL (ref 0.3–1.2)

## 2014-04-05 LAB — I-STAT CHEM 8, ED
BUN: 12 mg/dL (ref 6–23)
CHLORIDE: 108 meq/L (ref 96–112)
Calcium, Ion: 1.12 mmol/L (ref 1.12–1.23)
Creatinine, Ser: 1.5 mg/dL — ABNORMAL HIGH (ref 0.50–1.35)
Glucose, Bld: 103 mg/dL — ABNORMAL HIGH (ref 70–99)
HEMATOCRIT: 47 % (ref 39.0–52.0)
Hemoglobin: 16 g/dL (ref 13.0–17.0)
POTASSIUM: 4 meq/L (ref 3.7–5.3)
Sodium: 141 mEq/L (ref 137–147)
TCO2: 19 mmol/L (ref 0–100)

## 2014-04-05 LAB — PROTIME-INR
INR: 1.06 (ref 0.00–1.49)
Prothrombin Time: 13.8 seconds (ref 11.6–15.2)

## 2014-04-05 LAB — CBC
HCT: 43.3 % (ref 39.0–52.0)
Hemoglobin: 14.7 g/dL (ref 13.0–17.0)
MCH: 27.5 pg (ref 26.0–34.0)
MCHC: 33.9 g/dL (ref 30.0–36.0)
MCV: 80.9 fL (ref 78.0–100.0)
Platelets: 257 10*3/uL (ref 150–400)
RBC: 5.35 MIL/uL (ref 4.22–5.81)
RDW: 14.4 % (ref 11.5–15.5)
WBC: 11.6 10*3/uL — AB (ref 4.0–10.5)

## 2014-04-05 LAB — TROPONIN I: Troponin I: 0.33 ng/mL (ref <0.30)

## 2014-04-05 LAB — APTT: APTT: 31 s (ref 24–37)

## 2014-04-05 LAB — PRO B NATRIURETIC PEPTIDE: Pro B Natriuretic peptide (BNP): 2075 pg/mL — ABNORMAL HIGH (ref 0–125)

## 2014-04-05 MED ORDER — ACETAMINOPHEN 500 MG PO TABS
1000.0000 mg | ORAL_TABLET | Freq: Four times a day (QID) | ORAL | Status: DC | PRN
  Administered 2014-04-05: 1000 mg via ORAL
  Filled 2014-04-05: qty 2

## 2014-04-05 MED ORDER — LISINOPRIL 20 MG PO TABS
20.0000 mg | ORAL_TABLET | Freq: Every day | ORAL | Status: DC
  Administered 2014-04-05 – 2014-04-08 (×4): 20 mg via ORAL
  Filled 2014-04-05 (×4): qty 1

## 2014-04-05 MED ORDER — SODIUM CHLORIDE 0.9 % IJ SOLN
3.0000 mL | Freq: Two times a day (BID) | INTRAMUSCULAR | Status: DC
  Administered 2014-04-05 – 2014-04-07 (×4): 3 mL via INTRAVENOUS

## 2014-04-05 MED ORDER — HEPARIN (PORCINE) IN NACL 100-0.45 UNIT/ML-% IJ SOLN
1700.0000 [IU]/h | INTRAMUSCULAR | Status: DC
  Administered 2014-04-05 (×2): 1300 [IU]/h via INTRAVENOUS
  Administered 2014-04-06 (×2): 1700 [IU]/h via INTRAVENOUS
  Filled 2014-04-05 (×2): qty 250

## 2014-04-05 MED ORDER — ATORVASTATIN CALCIUM 80 MG PO TABS
80.0000 mg | ORAL_TABLET | Freq: Every day | ORAL | Status: DC
  Administered 2014-04-05 – 2014-04-07 (×3): 80 mg via ORAL
  Filled 2014-04-05 (×4): qty 1

## 2014-04-05 MED ORDER — HEPARIN BOLUS VIA INFUSION
4000.0000 [IU] | Freq: Once | INTRAVENOUS | Status: AC
  Administered 2014-04-05: 4000 [IU] via INTRAVENOUS
  Filled 2014-04-05: qty 4000

## 2014-04-05 MED ORDER — CLOPIDOGREL BISULFATE 75 MG PO TABS
75.0000 mg | ORAL_TABLET | Freq: Every day | ORAL | Status: DC
  Administered 2014-04-05: 75 mg via ORAL
  Filled 2014-04-05 (×2): qty 1

## 2014-04-05 MED ORDER — SODIUM CHLORIDE 0.9 % IJ SOLN: 3.0000 mL | INTRAMUSCULAR | Status: DC | PRN

## 2014-04-05 MED ORDER — FUROSEMIDE 10 MG/ML IJ SOLN
40.0000 mg | Freq: Once | INTRAMUSCULAR | Status: AC
  Administered 2014-04-06: 40 mg via INTRAVENOUS
  Filled 2014-04-05: qty 4

## 2014-04-05 MED ORDER — SODIUM CHLORIDE 0.9 % IV SOLN: 250.0000 mL | INTRAVENOUS | Status: DC | PRN

## 2014-04-05 MED ORDER — INSULIN ASPART PROT & ASPART (70-30 MIX) 100 UNIT/ML ~~LOC~~ SUSP
34.0000 [IU] | Freq: Two times a day (BID) | SUBCUTANEOUS | Status: DC
  Administered 2014-04-06 – 2014-04-08 (×4): 34 [IU] via SUBCUTANEOUS
  Filled 2014-04-05: qty 10

## 2014-04-05 MED ORDER — ONDANSETRON HCL 4 MG/2ML IJ SOLN: 4.0000 mg | Freq: Four times a day (QID) | INTRAMUSCULAR | Status: DC | PRN

## 2014-04-05 MED ORDER — METOPROLOL TARTRATE 50 MG PO TABS
50.0000 mg | ORAL_TABLET | Freq: Two times a day (BID) | ORAL | Status: DC
  Administered 2014-04-05 – 2014-04-06 (×3): 50 mg via ORAL
  Filled 2014-04-05 (×5): qty 1

## 2014-04-05 MED ORDER — NITROGLYCERIN IN D5W 200-5 MCG/ML-% IV SOLN
10.0000 ug/min | INTRAVENOUS | Status: DC
  Administered 2014-04-05: 10 ug/min via INTRAVENOUS
  Filled 2014-04-05: qty 250

## 2014-04-05 MED ORDER — FUROSEMIDE 10 MG/ML IJ SOLN
40.0000 mg | Freq: Once | INTRAMUSCULAR | Status: AC
  Administered 2014-04-05: 40 mg via INTRAVENOUS
  Filled 2014-04-05: qty 4

## 2014-04-05 MED ORDER — GEMFIBROZIL 600 MG PO TABS
600.0000 mg | ORAL_TABLET | Freq: Two times a day (BID) | ORAL | Status: DC
  Filled 2014-04-05: qty 1

## 2014-04-05 NOTE — Progress Notes (Signed)
ANTICOAGULATION CONSULT NOTE - Initial Consult  Pharmacy Consult for Heparin Indication: ACS/MI  Allergies  Allergen Reactions  . Aspirin Shortness Of Breath and Rash    Rash, itching, burning sensation  (Patient reports he tolerates other NSAIDS)   . Bee Venom Hives and Swelling  . Tomato Rash    Patient Measurements:  TBW=127 kg   IBW= 65 kg Heparin Dosing Weight: 95 kg  Vital Signs: Temp: 98.4 F (36.9 C) (07/02 1450) Temp src: Oral (07/02 1450) BP: 143/101 mmHg (07/02 1630) Pulse Rate: 94 (07/02 1630)  Labs:  Recent Labs  04/05/14 1525 04/05/14 1548  HGB 14.7 16.0  HCT 43.3 47.0  PLT 257  --   CREATININE 1.40* 1.50*  TROPONINI 0.33*  --     The CrCl is unknown because both a height and weight (above a minimum accepted value) are required for this calculation.   Medical History: Past Medical History  Diagnosis Date  . Diabetes mellitus   . Hypertension   . Obesity   . History of gout   . History of kidney stones   . Obstructive sleep apnea     USES C-PAP  . Renal neoplasm     RT  . CHF (congestive heart failure)     EF 20-25%    Medications:   (Not in a hospital admission) Scheduled:   Infusions:  . nitroGLYCERIN     PRN:   Assessment: 38 yo male with Type 2 DM, obesity, CHF and HTN presenting to ED with SOB and chest tightness that started yesterday. Trop I=0.33.  Heparin to be started for ACS/MI. Baseline PLT/PT/INR pending but no anticoag med PTA.  Goal of Therapy:  Heparin level 0.3-0.7 units/ml Monitor PLT while on Heparin   Plan:  . Give heparin bolus of 4000 units IV x 1 . Start heparin gtt at 1300 units/hr . Check 6 hr HL then daily HL and CBC thereafter.  Garnet Sierras 04/05/2014,5:01 PM

## 2014-04-05 NOTE — Progress Notes (Signed)
Brittany PA at bedside

## 2014-04-05 NOTE — H&P (Signed)
Chief Complaint: SOB +CP  Primary Cardiologist: Dr. Harrington Challenger  HPI: The patient is a 38 y/o male with a h/o CHF, DM, morbid obesity, gout, HTN and sleep apnea. He also has a history of tobacco abuse, but states that he quit smoking 10 days ago. In 2012, he had an EF of 20-25%. He had a myoview NST in Jan. 2013 that demonstrated normal perfusion. Repeat 2D echo, at that time, demonstrated improved systolic function with an EF of 45-50%. He is followed by Dr. Harrington Challenger. His last office visit was a year ago on 03/31/2013. At that time, he is felt to be stable from a cardiac standpoint. He currently does not have a primary care provider.  The patient presents to [facility] after being transferred from [facility] this evening, for evaluation of shortness of breath and chest pain. He reports that he recently ran out of all of his medications one week ago. He was in his usual state of health until 2 days ago, when he developed shortness of breath at rest, worse with exertion. He denies weight gain, lower extremity edema, but notes orthopnea/PND. He denies dietary indiscretion. His dyspnea has gradually worsened over the last 2 days. Yesterday, he developed severe substernal chest discomfort. Described as a sensation of chest tightness. Nonradiating. It did not appear to be worse with exertion. He denies associated diaphoresis, dizziness, syncope/near-syncope. He does note mild nausea but no vomiting. The discomfort resolved spontaneously. Earlier today, he developed recurrent chest pain, rated 10/10 in severity. His chest discomfort, as well as a his dyspnea, prompted him to seek medical evaluation. At the [facility], his EKG demonstrated normal sinus rhythm with lateral T wave inversions that appear to be new compared to prior EKGs. Troponin was minimally elevated at 0.33. BNP was also elevated at 2075. Chest x-ray demonstrated diffuse pulmonary edema. Serum creatinine is 1.40. At [facility], he was given  a dose of IV Lasix, 40 mg. He was also started on IV heparin and IV nitroglycerin. He states that he is currently breathing better and denies any chest pain at this moment.  Past Medical History  Diagnosis Date  . Hypertension   . Obesity   . History of gout   . CHF (congestive heart failure)     EF 20-25%  . Renal neoplasm     RT  . Kidney stones   . OSA on CPAP   . High cholesterol   . Asthma     "as a child til I was 67 or 17"  . Type II diabetes mellitus   . Anxiety     "real bad here lately" (04/05/2014)  . Renal cancer     h/o renal neoplasm; "not sure if it was malignant or not"    Past Surgical History  Procedure Laterality Date  . Robotic assited partial nephrectomy Right 01/13/2013    Procedure: ROBOTIC ASSITED PARTIAL NEPHRECTOMY CONVERTED TO ROBOTIC ASSISTED RIGHT RADICAL NEPHRECTOMY;  Surgeon: Alexis Frock, MD;  Location: [facility];  Service: Urology;  Laterality: Right;  . Robotic assisted laparoscopic lysis of adhesion  01/13/2013    Procedure: ROBOTIC ASSISTED LAPAROSCOPIC LYSIS OF ADHESION EXTENSIVE;  Surgeon: Alexis Frock, MD;  Location: [facility];  Service: Urology;;  . Appendectomy  07/2004    Family History  Problem Relation Age of Onset  . Diabetes Other   . Hypertension Other    Social History:  reports that he quit smoking 9 days ago. His smoking use included  Cigarettes. He has a 5.5 pack-year smoking history. He has never used smokeless tobacco. He reports that he drinks about 9.6 ounces of alcohol per week. He reports that he uses illicit drugs (Marijuana).  Allergies:  Allergies  Allergen Reactions  . Aspirin Shortness Of Breath and Rash    Rash, itching, burning sensation  (Patient reports he tolerates other NSAIDS)   . Bee Venom Hives and Swelling  . Tomato Rash    Medications Prior to Admission  Medication Sig Dispense Refill  . acetaminophen (TYLENOL) 500 MG tablet Take 1,000 mg by mouth every 6 (six) hours as needed for mild pain.      [name]  colchicine 0.6 MG tablet Take 0.6 mg by mouth daily as needed (for gout).      . furosemide (LASIX) 40 MG tablet Take 40 mg by mouth 2 (two) times daily.      [name] gemfibrozil (LOPID) 600 MG tablet Take 600 mg by mouth 2 (two) times daily before a meal.      . insulin NPH-regular (NOVOLIN 70/30) (70-30) 100 UNIT/ML injection Inject 34 Units into the skin 2 (two) times daily with a meal.  10 mL  12  . lisinopril (PRINIVIL,ZESTRIL) 20 MG tablet Take 20 mg by mouth daily.      . metoprolol (LOPRESSOR) 50 MG tablet Take 50 mg by mouth 2 (two) times daily.      [name] EPINEPHrine (EPIPEN) 0.3 mg/0.3 mL SOAJ injection Inject 0.3 mLs (0.3 mg total) into the muscle as needed (as needed for severe allergy).  1 Device  0    Results for orders placed during the hospital encounter of 04/05/14 (from the past 48 hour(s))  CBC     Status: Abnormal   Collection Time    04/05/14  3:25 PM      Result Value Ref Range   WBC 11.6 (*) 4.0 - 10.5 K/uL   RBC 5.35  4.22 - 5.81 MIL/uL   Hemoglobin 14.7  13.0 - 17.0 g/dL   HCT 43.3  39.0 - 52.0 %   MCV 80.9  78.0 - 100.0 fL   MCH 27.5  26.0 - 34.0 pg   MCHC 33.9  30.0 - 36.0 g/dL   RDW 14.4  11.5 - 15.5 %   Platelets 257  150 - 400 K/uL  PRO B NATRIURETIC PEPTIDE     Status: Abnormal   Collection Time    04/05/14  3:25 PM      Result Value Ref Range   Pro B Natriuretic peptide (BNP) 2075.0 (*) 0 - 125 pg/mL  COMPREHENSIVE METABOLIC PANEL     Status: Abnormal   Collection Time    04/05/14  3:25 PM      Result Value Ref Range   Sodium 138  137 - 147 mEq/L   Potassium 4.6  3.7 - 5.3 mEq/L   Chloride 108  96 - 112 mEq/L   CO2 16 (*) 19 - 32 mEq/L   Glucose, Bld 102 (*) 70 - 99 mg/dL   BUN 13  6 - 23 mg/dL   Creatinine, Ser 1.40 (*) 0.50 - 1.35 mg/dL   Calcium 9.1  8.4 - 10.5 mg/dL   Total Protein 7.0  6.0 - 8.3 g/dL   Albumin 3.0 (*) 3.5 - 5.2 g/dL   AST 32  0 - 37 U/L   Comment: SLIGHT HEMOLYSIS     HEMOLYSIS AT THIS LEVEL MAY AFFECT RESULT   ALT 21  0 - 53  U/L  Alkaline Phosphatase 73  39 - 117 U/L   Total Bilirubin 0.7  0.3 - 1.2 mg/dL   GFR calc non Af Amer 62 (*) >90 mL/min   GFR calc Af Amer 72 (*) >90 mL/min   Comment: (NOTE)     The eGFR has been calculated using the CKD EPI equation.     This calculation has not been validated in all clinical situations.     eGFR's persistently <90 mL/min signify possible Chronic Kidney     Disease.   Anion gap 14  5 - 15  TROPONIN I     Status: Abnormal   Collection Time    04/05/14  3:25 PM      Result Value Ref Range   Troponin I 0.33 (*) <0.30 ng/mL   Comment:            Due to the release kinetics of cTnI,     a negative result within the first hours     of the onset of symptoms does not rule out     myocardial infarction with certainty.     If myocardial infarction is still suspected,     repeat the test at appropriate intervals.     CRITICAL RESULT CALLED TO, READ BACK BY AND VERIFIED WITH:     HALL,C @ 1623 ON [id number] BY POTEAT,S  I-STAT CHEM 8, ED     Status: Abnormal   Collection Time    04/05/14  3:48 PM      Result Value Ref Range   Sodium 141  137 - 147 mEq/L   Potassium 4.0  3.7 - 5.3 mEq/L   Chloride 108  96 - 112 mEq/L   BUN 12  6 - 23 mg/dL   Creatinine, Ser 1.50 (*) 0.50 - 1.35 mg/dL   Glucose, Bld 103 (*) 70 - 99 mg/dL   Calcium, Ion 1.12  1.12 - 1.23 mmol/L   TCO2 19  0 - 100 mmol/L   Hemoglobin 16.0  13.0 - 17.0 g/dL   HCT 47.0  39.0 - 52.0 %  PROTIME-INR     Status: None   Collection Time    04/05/14  5:23 PM      Result Value Ref Range   Prothrombin Time 13.8  11.6 - 15.2 seconds   INR 1.06  0.00 - 1.49  APTT     Status: None   Collection Time    04/05/14  5:23 PM      Result Value Ref Range   aPTT 31  24 - 37 seconds   Dg Chest Port 1 View  04/05/2014   CLINICAL DATA:  Short of breath  EXAM: PORTABLE CHEST - 1 VIEW  COMPARISON:  03/08/2013  FINDINGS: Diffuse bilateral airspace disease consistent with pulmonary edema. Negative for effusion. Mild cardiac  enlargement.  IMPRESSION: Diffuse pulmonary edema.   Electronically Signed   By: Franchot Gallo M.D.   On: 04/05/2014 15:13    Review of Systems  Constitutional: Negative for diaphoresis.  Respiratory: Positive for cough and shortness of breath.   Cardiovascular: Positive for chest pain, orthopnea and PND. Negative for leg swelling.  Gastrointestinal: Positive for nausea. Negative for vomiting.  Neurological: Negative for dizziness and loss of consciousness.  All other systems reviewed and are negative.   Blood pressure 150/107, pulse 93, temperature 98.5 F (36.9 C), temperature source Oral, resp. rate 20, height 5' 6.5" (1.689 m), weight 279 lb 8 oz (126.78 kg), SpO2 91.00%. Physical Exam  Constitutional:  He is oriented to person, place, and time. He appears well-developed and well-nourished. No distress.  Neck: No JVD present. Carotid bruit is not present.  Cardiovascular: Regular rhythm, normal heart sounds and intact distal pulses.  Exam reveals no gallop and no friction rub.   No murmur heard. Respiratory: Effort normal and breath sounds normal. No respiratory distress. He has no wheezes. He has no rales.  GI: Soft. Bowel sounds are normal. He exhibits no distension and no mass. There is no tenderness.  Musculoskeletal: He exhibits no edema.  Neurological: He is alert and oriented to person, place, and time.  Skin: Skin is warm and dry. He is not diaphoretic.  Psychiatric: He has a normal mood and affect. His behavior is normal.     Assessment/Plan Principal Problem:   Acute on chronic systolic CHF (congestive heart failure) Active Problems:   NSTEMI (non-ST elevated myocardial infarction)   HTN (hypertension)   DM (diabetes mellitus)  1. CHF: BMP is elevated at 2075. Chest x-ray also consistent with CHF with diffuse pulmonary edema. He received 40 mg of IV Lasix at [facility]. He currently does not appear to be volume overloaded on physical exam. Recommend  reassessing in the a.m. and may possibly give another dose of IV Lasix if needed. We'll need to recheck a 2-D echocardiogram in the morning. Low-sodium diet. Daily weights. Strict I.'s and O.'s. He has been out of his home medications for one week. We'll reorder these medications which include lisinopril and metoprolol.  2. Elevated troponin: Troponin was minimally elevated at [facility] at 0.33. Question if this is subsequent to acute on chronic CHF exacerbation. For now, we will continue him on IV heparin and IV nitroglycerin and will continue to cycle cardiac enzymes. Will make n.p.o. at midnight, as he will likely need to undergo an ischemic eval, nuclear stress test versus left heart catheterization.  3. Hyertension: Blood pressure is elevated at 150/107. Continue IV nitroglycerin. He has been out of all of his home medications for one week. Will recorder his metoprolol and lisinopril. Continue to monitor blood pressure closely.  4. Diabetes: He states that he currently does not have a PCP and has not been seen by a physician in over year. We'll check a hemoglobin A1c to see how well his diabetes has been controlled.   Lyda Jester 04/05/2014, 7:19 PM   Attending Note 38 yo male hx of chronic systolic heart failure LVEF 20-25% in 2012 that has nearly normalized as of 2013 at LVEF 45-50%. Notes describe a prior NICM however I do not see cath report, non-invasive testing 2013 showed no scar or ischemia. He presents today with several week history of progressing SOB, and a recent history of chest pain. Of note he has been off all his meds for 1-2 weeks including his lasix, he reports tough time affording and getting to pharmacy to pick them up.   CXR diffuse pulmonary edema, trop 0.33, K 4.6, CO2 16, Gluc 102, BUN 13, Cr 1.40, GFR 72, BNP 2075, Hgb 14.7, Plt 257,  EKG poor quality with severly varying baseline, NSR, RAE/LAE, poor R wave progression with TWI lateral precordial leads  that are new.   Acute on chronic systolic heart failure, mild troponin elevation unclear if ischemia or related to volume overload in the setting of CKD. He does have new EKG changes supporting possible ischemia. TIMI score >3, will continue anticoagulation. He has a reported aspirin allergy. Risks vs benefits of plavix for NSTEMI  given potential for multivessel disease and bypass, however since he is unable to take ASA I feel he does require an antiplatelet agent in the setting of possible unstable plaque, and will start plavix 40m daily. Continue beta blocker, ACE-I. Start high dose statin, stop gemfibrozil. Will check echo in AM, keep NPO tonight. Continue NG drip overnight.  - He has a mild metabolic acidosis with a mild gap of 14, bicarb is trending up. Will follow for now - volume status appears improved on exam, hold further lasix tonight. - potential invasive testing will need to consider CKD and medical compliance.    JCarlyle DollyMD

## 2014-04-05 NOTE — ED Provider Notes (Signed)
CSN: [id number]     Arrival date & time 04/05/14  1440 History   First MD Initiated Contact with Patient 04/05/14 1503     Chief Complaint  Patient presents with  . Shortness of Breath  . hx CHF      (Consider location/radiation/quality/duration/timing/severity/associated sxs/prior Treatment) The history is provided by the patient. No language interpreter was used.  Jeffrey [name] is a 38 y/o M with PMHx of Type II DM uncontrolled, Obesity, gout, CHF with echo performed in 2013 with ejection fraction of 45-50%, and HTN presenting to the ED with shortness of breath and chest tightness that started yesterday while the patient was resting. Patient reported that the chest pain is localized to his entire chest described as a intermittent chest tightness that varies in intensity. Reported that he is constantly short of breath starting yesterday and stated that when he is walking, laying flat, and with episodes of chest tightness the shortness of breath gets worse. When asked if patient is followed by a Cardiologist - patient denied. Patient reported that he has not taken ant of his medications in the past 1-2 weeks due to running out of them. Stated that he did take his insulin yesterday, but has not checked his glucose levels within a week or two. Reported that he has been having a dry cough for the past couple of days. Stated that he smokes less than a pack of cigarettes per day, but reported that his last cigarette was approximately 8 days ago. Denied syncope, fever, chills, swelling to the legs, travel for long period of time, nasal congestion, weakness, numbness, tingling, blurred vision, sudden loss of vision. PCP Dr. Doreene Burke  Past Medical History  Diagnosis Date  . Diabetes mellitus   . Hypertension   . Obesity   . History of gout   . History of kidney stones   . Obstructive sleep apnea     USES C-PAP  . Renal neoplasm     RT  . CHF (congestive heart failure)     EF 20-25%   Past Surgical  History  Procedure Laterality Date  . Appendectomy    . Robotic assited partial nephrectomy Right 01/13/2013    Procedure: ROBOTIC ASSITED PARTIAL NEPHRECTOMY CONVERTED TO ROBOTIC ASSISTED RIGHT RADICAL NEPHRECTOMY;  Surgeon: Alexis Frock, MD;  Location: [facility];  Service: Urology;  Laterality: Right;  . Robotic assisted laparoscopic lysis of adhesion  01/13/2013    Procedure: ROBOTIC ASSISTED LAPAROSCOPIC LYSIS OF ADHESION EXTENSIVE;  Surgeon: Alexis Frock, MD;  Location: [facility];  Service: Urology;;   Family History  Problem Relation Age of Onset  . Diabetes Other   . Hypertension Other    History  Substance Use Topics  . Smoking status: Current Every Day Smoker -- 0.50 packs/day    Types: Cigarettes    Last Attempt to Quit: 12/16/2012  . Smokeless tobacco: Not on file  . Alcohol Use: No     Comment: quit etoh 2011    Review of Systems  Constitutional: Negative for fever and chills.  Eyes: Negative for visual disturbance.  Respiratory: Positive for cough (dry), chest tightness and shortness of breath.   Cardiovascular: Positive for chest pain. Negative for leg swelling.  Gastrointestinal: Negative for nausea, vomiting and abdominal pain.  Genitourinary: Negative for decreased urine volume.  Neurological: Positive for headaches. Negative for dizziness and weakness.      Allergies  Aspirin; Bee venom; and Tomato  Home Medications   Prior to Admission medications  Medication Sig Start Date End Date Taking? Authorizing Provider  acetaminophen (TYLENOL) 500 MG tablet Take 1,000 mg by mouth every 6 (six) hours as needed for mild pain.   Yes Historical Provider, MD  colchicine 0.6 MG tablet Take 0.6 mg by mouth daily as needed (for gout).   Yes Historical Provider, MD  furosemide (LASIX) 40 MG tablet Take 40 mg by mouth 2 (two) times daily.   Yes Historical Provider, MD  gemfibrozil (LOPID) 600 MG tablet Take 600 mg by mouth 2 (two) times daily before a meal.   Yes Historical  Provider, MD  insulin NPH-regular (NOVOLIN 70/30) (70-30) 100 UNIT/ML injection Inject 34 Units into the skin 2 (two) times daily with a meal. 08/19/13  Yes Angelica Chessman, MD  lisinopril (PRINIVIL,ZESTRIL) 20 MG tablet Take 20 mg by mouth daily.   Yes Historical Provider, MD  metoprolol (LOPRESSOR) 50 MG tablet Take 50 mg by mouth 2 (two) times daily.   Yes Historical Provider, MD  EPINEPHrine (EPIPEN) 0.3 mg/0.3 mL SOAJ injection Inject 0.3 mLs (0.3 mg total) into the muscle as needed (as needed for severe allergy). 06/18/13   Ezequiel Essex, MD   BP 161/120  Pulse 100  Temp(Src) 98.4 F (36.9 C) (Oral)  Resp 31  SpO2 92% Physical Exam  Nursing note and vitals reviewed. Constitutional: He is oriented to person, place, and time. He appears well-developed and well-nourished. No distress.  HENT:  Head: Normocephalic and atraumatic.  Mouth/Throat: Oropharynx is clear and moist. No oropharyngeal exudate.  Eyes: Conjunctivae and EOM are normal. Pupils are equal, round, and reactive to light. Right eye exhibits no discharge. Left eye exhibits no discharge.  Neck: Normal range of motion. Neck supple. No tracheal deviation present.  Cardiovascular: Normal rate, regular rhythm and normal heart sounds.  Exam reveals no friction rub.   No murmur heard. Pulses:      Radial pulses are 2+ on the right side, and 2+ on the left side.       Dorsalis pedis pulses are 2+ on the right side, and 2+ on the left side.  Cap refill < 3 seconds Negative swelling or pitting edema noted to the lower extremities bilaterally   Pulmonary/Chest: Effort normal. No respiratory distress. He has decreased breath sounds in the right upper field, the right middle field, the right lower field, the left upper field and the left lower field. He has no wheezes. He has no rales. He exhibits no tenderness, no bony tenderness, no crepitus, no edema, no deformity and no retraction.  Patient is able to speak in full sentences  without difficulty Negative use of accessory muscles Negative stridor  Decreased breath sounds noted to upper and lower lobes bilaterally   Abdominal: Soft. Bowel sounds are normal. He exhibits no distension. There is no tenderness. There is no rebound and no guarding.  Obese BS normoactive in all 4 quadrants Abdomen soft upon palpation  Negative peritoneal signs Negative rigidity or guarding noted Negative fluid wave noted  Musculoskeletal: Normal range of motion.  Full ROM to upper and lower extremities without difficulty noted, negative ataxia noted.  Lymphadenopathy:    He has no cervical adenopathy.  Neurological: He is alert and oriented to person, place, and time. No cranial nerve deficit. He exhibits normal muscle tone. Coordination normal.  Skin: Skin is warm and dry. No rash noted. He is not diaphoretic. No erythema.  Psychiatric: He has a normal mood and affect. His behavior is normal. Thought content normal.  ED Course  Procedures (including critical care time)  4:37 PM This provider spoke with Wannetta Sender, NP, Cardiology - discussed case, labs, EKG, and imaging in great detail. As per provider, recommended patient to be transferred to [facility] and admitted to Telemetry floor under the care of Dr. Manley Mason  4:50 PM This provider spoke with Wannetta Sender, NP - recommended Nitro drip and Heparin   4:53 PM Dr. Allie Bossier spoke with Wannetta Sender to verify - low dose Heparin and Nitro to be started.   Results for orders placed during the hospital encounter of 04/05/14  CBC      Result Value Ref Range   WBC 11.6 (*) 4.0 - 10.5 K/uL   RBC 5.35  4.22 - 5.81 MIL/uL   Hemoglobin 14.7  13.0 - 17.0 g/dL   HCT 43.3  39.0 - 52.0 %   MCV 80.9  78.0 - 100.0 fL   MCH 27.5  26.0 - 34.0 pg   MCHC 33.9  30.0 - 36.0 g/dL   RDW 14.4  11.5 - 15.5 %   Platelets 257  150 - 400 K/uL  PRO B NATRIURETIC PEPTIDE      Result Value Ref Range   Pro B Natriuretic peptide (BNP) 2075.0 (*) 0 - 125 pg/mL   COMPREHENSIVE METABOLIC PANEL      Result Value Ref Range   Sodium 138  137 - 147 mEq/L   Potassium 4.6  3.7 - 5.3 mEq/L   Chloride 108  96 - 112 mEq/L   CO2 16 (*) 19 - 32 mEq/L   Glucose, Bld 102 (*) 70 - 99 mg/dL   BUN 13  6 - 23 mg/dL   Creatinine, Ser 1.40 (*) 0.50 - 1.35 mg/dL   Calcium 9.1  8.4 - 10.5 mg/dL   Total Protein 7.0  6.0 - 8.3 g/dL   Albumin 3.0 (*) 3.5 - 5.2 g/dL   AST 32  0 - 37 U/L   ALT 21  0 - 53 U/L   Alkaline Phosphatase 73  39 - 117 U/L   Total Bilirubin 0.7  0.3 - 1.2 mg/dL   GFR calc non Af Amer 62 (*) >90 mL/min   GFR calc Af Amer 72 (*) >90 mL/min   Anion gap 14  5 - 15  TROPONIN I      Result Value Ref Range   Troponin I 0.33 (*) <0.30 ng/mL  I-STAT CHEM 8, ED      Result Value Ref Range   Sodium 141  137 - 147 mEq/L   Potassium 4.0  3.7 - 5.3 mEq/L   Chloride 108  96 - 112 mEq/L   BUN 12  6 - 23 mg/dL   Creatinine, Ser 1.50 (*) 0.50 - 1.35 mg/dL   Glucose, Bld 103 (*) 70 - 99 mg/dL   Calcium, Ion 1.12  1.12 - 1.23 mmol/L   TCO2 19  0 - 100 mmol/L   Hemoglobin 16.0  13.0 - 17.0 g/dL   HCT 47.0  39.0 - 52.0 %    Labs Review Labs Reviewed  CBC - Abnormal; Notable for the following:    WBC 11.6 (*)    All other components within normal limits  PRO B NATRIURETIC PEPTIDE - Abnormal; Notable for the following:    Pro B Natriuretic peptide (BNP) 2075.0 (*)    All other components within normal limits  COMPREHENSIVE METABOLIC PANEL - Abnormal; Notable for the following:    CO2 16 (*)    Glucose, Bld  102 (*)    Creatinine, Ser 1.40 (*)    Albumin 3.0 (*)    GFR calc non Af Amer 62 (*)    GFR calc Af Amer 72 (*)    All other components within normal limits  TROPONIN I - Abnormal; Notable for the following:    Troponin I 0.33 (*)    All other components within normal limits  I-STAT CHEM 8, ED - Abnormal; Notable for the following:    Creatinine, Ser 1.50 (*)    Glucose, Bld 103 (*)    All other components within normal limits  CBG  MONITORING, ED    Imaging Review Dg Chest Port 1 View  04/05/2014   CLINICAL DATA:  Short of breath  EXAM: PORTABLE CHEST - 1 VIEW  COMPARISON:  03/08/2013  FINDINGS: Diffuse bilateral airspace disease consistent with pulmonary edema. Negative for effusion. Mild cardiac enlargement.  IMPRESSION: Diffuse pulmonary edema.   Electronically Signed   By: Franchot Gallo M.D.   On: 04/05/2014 15:13     EKG Interpretation   Date/Time:  Thursday April 05 2014 14:45:55 EDT Ventricular Rate:  94 PR Interval:  157 QRS Duration: 86 QT Interval:  353 QTC Calculation: 441 R Axis:   114 Text Interpretation:  Sinus rhythm LAE, consider biatrial enlargement  Right axis deviation Borderline repolarization abnormality Baseline wander  in lead(s) I III aVR aVL TWI laterally new since previous  Confirmed by  YAO  MD, DAVID ([id number]) on 04/05/2014 3:56:14 PM      MDM   Final diagnoses:  NSTEMI (non-ST elevated myocardial infarction)  CHF exacerbation  Medically noncompliant    Medications  nitroGLYCERIN 0.2 mg/mL in dextrose 5 % infusion (not administered)  furosemide (LASIX) injection 40 mg (40 mg Intravenous Given 04/05/14 1556)   Filed Vitals:   04/05/14 1450 04/05/14 1530 04/05/14 1532 04/05/14 1600  BP: 136/98  153/116 161/120  Pulse: 94  102 100  Temp: 98.4 F (36.9 C)     TempSrc: Oral     Resp: 25  34 31  SpO2: 94% 89% 93% 92%   Patient presenting to the ED with chest tightness and shortness of breath that started yesterday. Patient not medically compliant. Last echo performed in 2013 with ejection fraction of 45-50%. Patient does not follow cardiology. Patient does not know base weight.  EKG noted sinus rhythm with a heart rate of 94 bpm with bilateral enlargement - right axis deviation with new T wave inversion laterally in leads V5 and V6 that is a new finding. Troponin elevated at 0.33. BNP elevated 2075.0. CBC mildly elevated WBC of 11.6. Chem-8 noted glucose of 103. Creatinine of 1.50  - when compared to previous labs, patient has been as high as 1.80. Anion gap of 14.0 mEq/L. Chest xray noted diffuse pulmonary edema with mild cardiac enlargement, no sign of effusion. While in the ED setting, patient's pulse ox on room air dropped to 89-90% - patient was placed on oxygen via nasal cannula 2 L/min.   Doubt DKA. Patient presenting to the ED with unstable angina/NSTEMI and CHF exacerbation - started on Heparin and Nitro in ED setting. Patient to be transferred to [facility] under the recommendation of cardiology and for patient to be admitted to the Telemetry floor. Patient understood plan and agreed to plan of care. Patient stable for transfer.   Jamse Mead, PA-C 04/06/14 [phone]

## 2014-04-05 NOTE — ED Notes (Signed)
Critical lab value Troponin 0.33

## 2014-04-05 NOTE — ED Notes (Addendum)
Pt reports sudden onset of SOB and non productive cough. Hx CHF 20-25% EF. Pt reports chest discomfort 8/10. Pt able to speak in short sentences. Current smoker on and off x10 years.

## 2014-04-05 NOTE — Progress Notes (Signed)
Patient complained of headache. MD contacted, awaiting orders. Will continue to monitor.

## 2014-04-05 NOTE — Progress Notes (Signed)
Pt received into room 2w09, pt oriented to room and call Garrelts, pt states no chest pain at this time, pt placed on tele, will continue to monitor Rickard Rhymes, RN

## 2014-04-06 DIAGNOSIS — I517 Cardiomegaly: Secondary | ICD-10-CM

## 2014-04-06 LAB — BASIC METABOLIC PANEL
ANION GAP: 14 (ref 5–15)
BUN: 14 mg/dL (ref 6–23)
CO2: 21 meq/L (ref 19–32)
CREATININE: 1.54 mg/dL — AB (ref 0.50–1.35)
Calcium: 9.1 mg/dL (ref 8.4–10.5)
Chloride: 105 mEq/L (ref 96–112)
GFR calc non Af Amer: 55 mL/min — ABNORMAL LOW (ref >90)
GFR, EST AFRICAN AMERICAN: 64 mL/min — AB (ref >90)
Glucose, Bld: 112 mg/dL — ABNORMAL HIGH (ref 70–99)
POTASSIUM: 3.8 meq/L (ref 3.7–5.3)
Sodium: 140 mEq/L (ref 137–147)

## 2014-04-06 LAB — CBC
HCT: 45.3 % (ref 39.0–52.0)
Hemoglobin: 15.2 g/dL (ref 13.0–17.0)
MCH: 28 pg (ref 26.0–34.0)
MCHC: 33.6 g/dL (ref 30.0–36.0)
MCV: 83.4 fL (ref 78.0–100.0)
PLATELETS: 263 10*3/uL (ref 150–400)
RBC: 5.43 MIL/uL (ref 4.22–5.81)
RDW: 14.6 % (ref 11.5–15.5)
WBC: 10.8 10*3/uL — AB (ref 4.0–10.5)

## 2014-04-06 LAB — HEPARIN LEVEL (UNFRACTIONATED)
Heparin Unfractionated: <0.1 IU/mL — ABNORMAL LOW (ref 0.30–0.70)
Heparin Unfractionated: 0.4 IU/mL (ref 0.30–0.70)

## 2014-04-06 LAB — PRO B NATRIURETIC PEPTIDE: Pro B Natriuretic peptide (BNP): 1500 pg/mL — ABNORMAL HIGH (ref 0–125)

## 2014-04-06 LAB — TROPONIN I: TROPONIN I: 0.38 ng/mL — AB (ref <0.30)

## 2014-04-06 MED ORDER — GUAIFENESIN-DM 100-10 MG/5ML PO SYRP
5.0000 mL | ORAL_SOLUTION | ORAL | Status: DC | PRN
  Administered 2014-04-06 – 2014-04-07 (×3): 5 mL via ORAL
  Filled 2014-04-06 (×3): qty 5

## 2014-04-06 MED ORDER — HEPARIN BOLUS VIA INFUSION
3000.0000 [IU] | Freq: Once | INTRAVENOUS | Status: AC
  Administered 2014-04-06: 3000 [IU] via INTRAVENOUS
  Filled 2014-04-06: qty 3000

## 2014-04-06 NOTE — Progress Notes (Signed)
Subjective: Patient is breathing better  No CP   Objective: Filed Vitals:   04/05/14 1743 04/05/14 1836 04/05/14 2228 04/06/14 0300  BP: 130/88 150/107 133/93 128/97  Pulse: 92 93  79  Temp:  98.5 F (36.9 C)  98.4 F (36.9 C)  TempSrc:  Oral  Oral  Resp: 30 20  20   Height:      Weight:  279 lb 8 oz (126.78 kg)  278 lb 1.6 oz (126.145 kg)  SpO2: 92% 91%  94%   Weight change:   Intake/Output Summary (Last 24 hours) at 04/06/14 3295 Last data filed at 04/06/14 0310  Gross per 24 hour  Intake      0 ml  Output   1000 ml  Net  -1000 ml   Net I/O  incomplet  2000 CC urine out   No po intake recorded  General: Alert, awake, oriented x3, in no acute distress Neck:  JVP is normal Heart: Regular rate and rhythm, without murmurs, rubs, gallops.  Lungs: Clear to auscultation.  Crackle at L ase   Exemities:  Tr LE edema.   Neuro: Grossly intact, nonfocal.   Lab Results: Results for orders placed during the hospital encounter of 04/05/14 (from the past 24 hour(s))  CBC     Status: Abnormal   Collection Time    04/05/14  3:25 PM      Result Value Ref Range   WBC 11.6 (*) 4.0 - 10.5 K/uL   RBC 5.35  4.22 - 5.81 MIL/uL   Hemoglobin 14.7  13.0 - 17.0 g/dL   HCT 43.3  39.0 - 52.0 %   MCV 80.9  78.0 - 100.0 fL   MCH 27.5  26.0 - 34.0 pg   MCHC 33.9  30.0 - 36.0 g/dL   RDW 14.4  11.5 - 15.5 %   Platelets 257  150 - 400 K/uL  PRO B NATRIURETIC PEPTIDE     Status: Abnormal   Collection Time    04/05/14  3:25 PM      Result Value Ref Range   Pro B Natriuretic peptide (BNP) 2075.0 (*) 0 - 125 pg/mL  COMPREHENSIVE METABOLIC PANEL     Status: Abnormal   Collection Time    04/05/14  3:25 PM      Result Value Ref Range   Sodium 138  137 - 147 mEq/L   Potassium 4.6  3.7 - 5.3 mEq/L   Chloride 108  96 - 112 mEq/L   CO2 16 (*) 19 - 32 mEq/L   Glucose, Bld 102 (*) 70 - 99 mg/dL   BUN 13  6 - 23 mg/dL   Creatinine, Ser 1.40 (*) 0.50 - 1.35 mg/dL   Calcium 9.1  8.4 - 10.5 mg/dL   Total Protein 7.0  6.0 - 8.3 g/dL   Albumin 3.0 (*) 3.5 - 5.2 g/dL   AST 32  0 - 37 U/L   ALT 21  0 - 53 U/L   Alkaline Phosphatase 73  39 - 117 U/L   Total Bilirubin 0.7  0.3 - 1.2 mg/dL   GFR calc non Af Amer 62 (*) >90 mL/min   GFR calc Af Amer 72 (*) >90 mL/min   Anion gap 14  5 - 15  TROPONIN I     Status: Abnormal   Collection Time    04/05/14  3:25 PM      Result Value Ref Range   Troponin I 0.33 (*) <0.30 ng/mL  I-STAT CHEM 8,  ED     Status: Abnormal   Collection Time    04/05/14  3:48 PM      Result Value Ref Range   Sodium 141  137 - 147 mEq/L   Potassium 4.0  3.7 - 5.3 mEq/L   Chloride 108  96 - 112 mEq/L   BUN 12  6 - 23 mg/dL   Creatinine, Ser 1.50 (*) 0.50 - 1.35 mg/dL   Glucose, Bld 103 (*) 70 - 99 mg/dL   Calcium, Ion 1.12  1.12 - 1.23 mmol/L   TCO2 19  0 - 100 mmol/L   Hemoglobin 16.0  13.0 - 17.0 g/dL   HCT 47.0  39.0 - 52.0 %  PROTIME-INR     Status: None   Collection Time    04/05/14  5:23 PM      Result Value Ref Range   Prothrombin Time 13.8  11.6 - 15.2 seconds   INR 1.06  0.00 - 1.49  APTT     Status: None   Collection Time    04/05/14  5:23 PM      Result Value Ref Range   aPTT 31  24 - 37 seconds  BASIC METABOLIC PANEL     Status: Abnormal   Collection Time    04/06/14  1:51 AM      Result Value Ref Range   Sodium 140  137 - 147 mEq/L   Potassium 3.8  3.7 - 5.3 mEq/L   Chloride 105  96 - 112 mEq/L   CO2 21  19 - 32 mEq/L   Glucose, Bld 112 (*) 70 - 99 mg/dL   BUN 14  6 - 23 mg/dL   Creatinine, Ser 1.54 (*) 0.50 - 1.35 mg/dL   Calcium 9.1  8.4 - 10.5 mg/dL   GFR calc non Af Amer 55 (*) >90 mL/min   GFR calc Af Amer 64 (*) >90 mL/min   Anion gap 14  5 - 15  HEPARIN LEVEL (UNFRACTIONATED)     Status: Abnormal   Collection Time    04/06/14  1:51 AM      Result Value Ref Range   Heparin Unfractionated <0.10 (*) 0.30 - 0.70 IU/mL    Studies/Results: Dg Chest Port 1 View  04/05/2014   CLINICAL DATA:  Short of breath  EXAM: PORTABLE  CHEST - 1 VIEW  COMPARISON:  03/08/2013  FINDINGS: Diffuse bilateral airspace disease consistent with pulmonary edema. Negative for effusion. Mild cardiac enlargement.  IMPRESSION: Diffuse pulmonary edema.   Electronically Signed   By: Franchot Gallo M.D.   On: 04/05/2014 15:13    Medications:  Reviewed   @PROBHOSP @  Patient is a 38 yo with presumed NICM (with near normalization of LVEF, 45 to50% on last echo),  I saw him in clinic last June.  Admitted yesterday with chest tightness and SOB  Has duresed some  Feeling better.  Echo done today Will repeat troponin though I am not convinced of active ischemia.  Will stop NTG and heparin for now.    LOS: 1 day   Jeffrey [name] 04/06/2014, 8:12 AM

## 2014-04-06 NOTE — Progress Notes (Signed)
CRITICAL VALUE ALERT  Critical value received:  Trop 0.38  Date of notification:  04/06/2014  Time of notification:  3:10 PM  Critical value read back:Yes.    Nurse who received alert:  Payton Emerald, RN   MD notified (1st page):  Dr. Harrington Challenger  Time of first page:  3:11 PM   MD notified (2nd page):  Time of second page:  Responding MD:  Dr. [name]  Time MD responded:  3:13 PM

## 2014-04-06 NOTE — Progress Notes (Signed)
ANTICOAGULATION CONSULT NOTE - Follow Up Consult  Pharmacy Consult for heparin Indication: NSTEMI  Labs:  Recent Labs  04/05/14 1525 04/05/14 1548 04/05/14 1723 04/06/14 0151 04/06/14 0936  HGB 14.7 16.0  --   --   --   HCT 43.3 47.0  --   --   --   PLT 257  --   --   --   --   APTT  --   --  31  --   --   LABPROT  --   --  13.8  --   --   INR  --   --  1.06  --   --   HEPARINUNFRC  --   --   --  <0.10* 0.40  CREATININE 1.40* 1.50*  --  1.54*  --   TROPONINI 0.33*  --   --   --   --     Assessment: 38yo male on heparin for NSTEMI.  His HL is therapeutic at 0.4 after a 3000 unit bolus and rate increase to 1700 units/hr.  CBC in process.   Goal of Therapy:  Heparin level 0.3-0.7 units/ml Monitor platelets by anticoagulation protocol: Yes   Plan:  Continue heparin drip at1700 units/hr Daily HL and CBC Eudelia Bunch, Pharm.D. [phone] 04/06/2014 10:44 AM

## 2014-04-06 NOTE — Progress Notes (Signed)
I have reviewed echo.  LV is severely dilated.  LVEF is severely depressed at approximately 15 to 20%  This is different from last echo of 11/2011.  (2013 echo was improved from echo of Jan 2013.  Even in this initial echo, LV was never as dilated as now) Troponins are minimally elevated, prob due to CHF Patient is breathing much easier than on admit  Still with some volume increase on exam I would continue diuresing  Reassess exam, labs in AM   If clinicially improved could consider d/cing  Patient should have cath to confirm anatomy but does not have to wait necessarily to get done.

## 2014-04-06 NOTE — Progress Notes (Signed)
ANTICOAGULATION CONSULT NOTE - Follow Up Consult  Pharmacy Consult for heparin Indication: NSTEMI  Labs:  Recent Labs  04/05/14 1525 04/05/14 1548 04/05/14 1723 04/06/14 0151  HGB 14.7 16.0  --   --   HCT 43.3 47.0  --   --   PLT 257  --   --   --   APTT  --   --  31  --   LABPROT  --   --  13.8  --   INR  --   --  1.06  --   HEPARINUNFRC  --   --   --  <0.10*  CREATININE 1.40* 1.50*  --  1.54*  TROPONINI 0.33*  --   --   --     Assessment: 38yo male undetectable on heparin with initial dosing for NSTEMI.  Goal of Therapy:  Heparin level 0.3-0.7 units/ml Monitor platelets by anticoagulation protocol: Yes   Plan:  Will rebolus with heparin 3000 units and increase gtt by 4 units/kg/hr to 1700 units/hr and check level in 6hr.  Wynona Neat, PharmD, BCPS  04/06/2014,3:12 AM

## 2014-04-06 NOTE — Progress Notes (Signed)
Echocardiogram 2D Echocardiogram has been performed.  Joelene Millin 04/06/2014, 8:46 AM

## 2014-04-06 NOTE — Care Management Note (Addendum)
    Page 1 of 1   04/06/2014     11:00:18 AM CARE MANAGEMENT NOTE 04/06/2014  Patient:  Jeffrey [name], Jeffrey [name]   Account Number:  [id number]  Date Initiated:  04/06/2014  Documentation initiated by:  Jeffrey [name]  Subjective/Objective Assessment:   adm w mi     Action/Plan:   lives w fam, pcpdr jegede   Anticipated DC Date:     Anticipated DC Plan:        Jeffrey [name]  CM consult  Medication Assistance      Choice offered to / List presented to:             Status of service:   Medicare Important Message given?   (If response is "NO", the following Medicare IM given date fields will be blank) Date Medicare IM given:   Medicare IM given by:   Date Additional Medicare IM given:   Additional Medicare IM given by:    Discharge Disposition:  HOME/SELF CARE  Per UR Regulation:  Reviewed for med. necessity/level of care/duration of stay  If discussed at [place] of Stay Meetings, dates discussed:    Comments:  7/3 1058a Jeffrey [name] Jeffrey Girten rn,bsn spoke w pt. he goes to md at [facility]. he has been set up to get meds at [facility]. his copays or 3-4.00 per med.

## 2014-04-07 LAB — BASIC METABOLIC PANEL
ANION GAP: 14 (ref 5–15)
Anion gap: 15 (ref 5–15)
BUN: 20 mg/dL (ref 6–23)
BUN: 19 mg/dL (ref 6–23)
CALCIUM: 9.1 mg/dL (ref 8.4–10.5)
CHLORIDE: 105 meq/L (ref 96–112)
CO2: 21 mEq/L (ref 19–32)
CO2: 19 mEq/L (ref 19–32)
CREATININE: 1.56 mg/dL — AB (ref 0.50–1.35)
Calcium: 9.1 mg/dL (ref 8.4–10.5)
Chloride: 106 mEq/L (ref 96–112)
Creatinine, Ser: 1.64 mg/dL — ABNORMAL HIGH (ref 0.50–1.35)
GFR calc Af Amer: 59 mL/min — ABNORMAL LOW (ref >90)
GFR calc non Af Amer: 54 mL/min — ABNORMAL LOW (ref >90)
GFR, EST AFRICAN AMERICAN: 63 mL/min — AB (ref >90)
GFR, EST NON AFRICAN AMERICAN: 51 mL/min — AB (ref >90)
GLUCOSE: 96 mg/dL (ref 70–99)
GLUCOSE: 107 mg/dL — AB (ref 70–99)
POTASSIUM: 4.4 meq/L (ref 3.7–5.3)
Potassium: 3.9 mEq/L (ref 3.7–5.3)
SODIUM: 141 meq/L (ref 137–147)
Sodium: 139 mEq/L (ref 137–147)

## 2014-04-07 LAB — CBC
HCT: 46 % (ref 39.0–52.0)
HEMOGLOBIN: 15.4 g/dL (ref 13.0–17.0)
MCH: 27.9 pg (ref 26.0–34.0)
MCHC: 33.5 g/dL (ref 30.0–36.0)
MCV: 83.5 fL (ref 78.0–100.0)
Platelets: 269 10*3/uL (ref 150–400)
RBC: 5.51 MIL/uL (ref 4.22–5.81)
RDW: 14.3 % (ref 11.5–15.5)
WBC: 10.3 10*3/uL (ref 4.0–10.5)

## 2014-04-07 LAB — PRO B NATRIURETIC PEPTIDE: PRO B NATRI PEPTIDE: 1464 pg/mL — AB (ref 0–125)

## 2014-04-07 MED ORDER — FUROSEMIDE 10 MG/ML IJ SOLN
60.0000 mg | Freq: Once | INTRAMUSCULAR | Status: AC
  Administered 2014-04-07: 60 mg via INTRAVENOUS

## 2014-04-07 MED ORDER — FUROSEMIDE 10 MG/ML IJ SOLN
80.0000 mg | Freq: Two times a day (BID) | INTRAMUSCULAR | Status: DC
  Administered 2014-04-07 – 2014-04-08 (×2): 80 mg via INTRAVENOUS
  Filled 2014-04-07 (×4): qty 8

## 2014-04-07 MED ORDER — CARVEDILOL 3.125 MG PO TABS
3.1250 mg | ORAL_TABLET | Freq: Two times a day (BID) | ORAL | Status: DC
  Administered 2014-04-07 – 2014-04-08 (×3): 3.125 mg via ORAL
  Filled 2014-04-07 (×5): qty 1

## 2014-04-07 MED ORDER — FUROSEMIDE 10 MG/ML IJ SOLN: 40.0000 mg | Freq: Once | INTRAMUSCULAR | Status: DC

## 2014-04-07 NOTE — Progress Notes (Signed)
Brownish tainted sputum noted when patient coughs. MD notified. Will continue to monitor, no orders at this time.

## 2014-04-07 NOTE — Progress Notes (Signed)
Patient had 5 beats of V- tach. Vital signs are stable and asymptomatic.Will continue to monitor.

## 2014-04-07 NOTE — Progress Notes (Signed)
Called by RN because pt has 2 doses of Lasix ordered. Lasix 40 mg IV x 1 dose for this pm, Lasix 80 mg IV BID, 1st dose this pm.  Reviewed orders, Dr. Harrington Challenger' orders were written after Dr. Nelly Laurence orders.  Will go with Dr. Alan Ripper orders as they were written last.   F/u in am.   Rosaria Ferries, PA-C 04/07/2014 4:40 PM Beeper [phone]

## 2014-04-07 NOTE — Progress Notes (Signed)
Patient ID: Jeffrey [name], male   DOB: [birth year], 38 y.o.   MRN: [id number]    Subjective:    SOB improving, still not quite at baseline.   Objective:   Temp:  [97.4 F (36.3 C)-98.3 F (36.8 C)] 98.3 F (36.8 C) (07/04 0350) Pulse Rate:  [77-87] 77 (07/04 0350) Resp:  [18-19] 19 (07/04 0350) BP: (106-140)/(72-99) 106/72 mmHg (07/04 0350) SpO2:  [95 %-96 %] 95 % (07/04 0350) Weight:  [278 lb 3.5 oz (126.2 kg)] 278 lb 3.5 oz (126.2 kg) (07/04 0350) Last BM Date: 04/05/14  Filed Weights   04/05/14 1836 04/06/14 0300 04/07/14 0350  Weight: 279 lb 8 oz (126.78 kg) 278 lb 1.6 oz (126.145 kg) 278 lb 3.5 oz (126.2 kg)    Intake/Output Summary (Last 24 hours) at 04/07/14 0952 Last data filed at 04/07/14 0730  Gross per 24 hour  Intake    790 ml  Output   2225 ml  Net  -1435 ml    Telemetry: NSR, 4 beats of NSVT  Exam:  General: NAD  Resp: faint crackles bilateral bases  Cardiac: RRR, no m/r/g, no JVD  IW:PYKDXIP soft, NT, ND  MSK: no LE edema  Neuro: no focal deficits  Psych: appropriate affect  Lab Results:  Basic Metabolic Panel:  Recent Labs Lab 04/06/14 0151 04/07/14 0340 04/07/14 0738  NA 140 141 139  K 3.8 3.9 4.4  CL 105 106 105  CO2 21 21 19   GLUCOSE 112* 96 107*  BUN 14 20 19   CREATININE 1.54* 1.64* 1.56*  CALCIUM 9.1 9.1 9.1    Liver Function Tests:  Recent Labs Lab 04/05/14 1525  AST 32  ALT 21  ALKPHOS 73  BILITOT 0.7  PROT 7.0  ALBUMIN 3.0*    CBC:  Recent Labs Lab 04/05/14 1525 04/05/14 1548 04/06/14 0936 04/07/14 0340  WBC 11.6*  --  10.8* 10.3  HGB 14.7 16.0 15.2 15.4  HCT 43.3 47.0 45.3 46.0  MCV 80.9  --  83.4 83.5  PLT 257  --  263 269    Cardiac Enzymes:  Recent Labs Lab 04/05/14 1525 04/06/14 1420  TROPONINI 0.33* 0.38*    BNP:  Recent Labs  04/05/14 1525 04/06/14 1420 04/07/14 0018  PROBNP 2075.0* 1500.0* 1464.0*    Coagulation:  Recent Labs Lab 04/05/14 1723  INR 1.06     ECG:   Medications:   Scheduled Medications: . atorvastatin  80 mg Oral q1800  . insulin aspart protamine- aspart  34 Units Subcutaneous BID WC  . lisinopril  20 mg Oral Daily  . metoprolol  50 mg Oral BID  . sodium chloride  3 mL Intravenous Q12H     Infusions:     PRN Medications:  sodium chloride, acetaminophen, guaiFENesin-dextromethorphan, ondansetron (ZOFRAN) IV, sodium chloride     Assessment/Plan    1. Acute on chronic systolic heart failure - prior LVEF 20-25% by echo in 2012, LVEF had nearly normalized to 45-50% in 2013 - repeat echo this admit LVEF has once again decreased, now approx 15% with diffuse hypokinesis and restrictive diastolic dysfunction.  - mild trop of 0.38, potentially related to volume overload in setting of CKD. Trop with T-wave inversions in lateral precordial leads, likely strain pattern related to his LVH. - he is negative 1.3 liters yesterday, total negative 2.6 liters since admission. Cr remains stable. He received lasix 40mg  once yesterday and 60mg  IV this AM - symptoms improving but still reports some DOE. Mild volume overload remains  by exam.  - in setting of systolic dysfunction change lopressor to coreg, should be less exepnsive than Toprol.  - no urgent indication for cath at this time, patient seen by his primary cardiolgist and there are considerations for possible outpatient testing pending renal function.  - continue IV diuretics today, likely discharge home tomorrow on oral lasix pending symptoms.  - add TSH to AM labs     Carlyle Dolly, M.D., F.A.C.C.

## 2014-04-08 ENCOUNTER — Encounter (HOSPITAL_COMMUNITY): Payer: Self-pay | Admitting: Physician Assistant

## 2014-04-08 ENCOUNTER — Other Ambulatory Visit: Payer: Self-pay | Admitting: Physician Assistant

## 2014-04-08 DIAGNOSIS — R778 Other specified abnormalities of plasma proteins: Secondary | ICD-10-CM | POA: Diagnosis present

## 2014-04-08 DIAGNOSIS — N179 Acute kidney failure, unspecified: Secondary | ICD-10-CM | POA: Diagnosis present

## 2014-04-08 DIAGNOSIS — Z888 Allergy status to other drugs, medicaments and biological substances status: Secondary | ICD-10-CM | POA: Diagnosis present

## 2014-04-08 DIAGNOSIS — N183 Chronic kidney disease, stage 3 unspecified: Secondary | ICD-10-CM | POA: Diagnosis present

## 2014-04-08 DIAGNOSIS — C649 Malignant neoplasm of unspecified kidney, except renal pelvis: Secondary | ICD-10-CM | POA: Insufficient documentation

## 2014-04-08 DIAGNOSIS — N189 Chronic kidney disease, unspecified: Secondary | ICD-10-CM

## 2014-04-08 DIAGNOSIS — R7989 Other specified abnormal findings of blood chemistry: Secondary | ICD-10-CM

## 2014-04-08 DIAGNOSIS — N182 Chronic kidney disease, stage 2 (mild): Secondary | ICD-10-CM

## 2014-04-08 LAB — TSH: TSH: 2.29 u[IU]/mL (ref 0.350–4.500)

## 2014-04-08 LAB — BASIC METABOLIC PANEL
Anion gap: 14 (ref 5–15)
BUN: 29 mg/dL — ABNORMAL HIGH (ref 6–23)
CHLORIDE: 101 meq/L (ref 96–112)
CO2: 24 meq/L (ref 19–32)
Calcium: 9.5 mg/dL (ref 8.4–10.5)
Creatinine, Ser: 1.85 mg/dL — ABNORMAL HIGH (ref 0.50–1.35)
GFR calc Af Amer: 51 mL/min — ABNORMAL LOW (ref >90)
GFR calc non Af Amer: 44 mL/min — ABNORMAL LOW (ref >90)
Glucose, Bld: 79 mg/dL (ref 70–99)
POTASSIUM: 4.4 meq/L (ref 3.7–5.3)
SODIUM: 139 meq/L (ref 137–147)

## 2014-04-08 MED ORDER — LISINOPRIL 20 MG PO TABS: 20.0000 mg | ORAL_TABLET | Freq: Every day | ORAL | Status: DC

## 2014-04-08 MED ORDER — CARVEDILOL 3.125 MG PO TABS: 3.1250 mg | ORAL_TABLET | Freq: Two times a day (BID) | ORAL | Status: DC

## 2014-04-08 MED ORDER — ATORVASTATIN CALCIUM 80 MG PO TABS: 80.0000 mg | ORAL_TABLET | Freq: Every evening | ORAL | Status: DC

## 2014-04-08 MED ORDER — TORSEMIDE 20 MG PO TABS: 20.0000 mg | ORAL_TABLET | Freq: Two times a day (BID) | ORAL | Status: DC

## 2014-04-08 NOTE — Discharge Instructions (Signed)
Increase activity slowly.  You may return to work 04/15/14.  Follow a low-salt diet and watch your fluid intake. In general, you should not be taking in more than 2 liters of fluid per day (no more than 8 glasses per day). Some patients are restricted to less than 1.5 liters of fluid per day (no more than 6 glasses per day). This includes sources of water in foods like soup, coffee, tea, milk, etc.  Do not start your Torsemide (Demadex - fluid pill) until tomorrow evening.

## 2014-04-08 NOTE — Discharge Summary (Signed)
Discharge Summary   Patient ID: Jeffrey [name] MRN: [id number], DOB/AGE: April 27, 1976 38 y.o. Admit date: 04/05/2014 D/C date:     04/08/2014  Primary Care Provider: Angelica Chessman, MD Primary Cardiologist: Lizbeth Bark  Primary Discharge Diagnoses:  1. Acute on chronic systolic heart failure  - EF 15% by echo this admission - prior hx: EF 20-25% in 2012, 45-50% in 2013 with nonischemic nuc at that time 2. Accelerated HTN 3. 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 Morbid obesity BMI 43.66 kg/(m^2). 5. Insulin dependent diabetes mellitus, controlled 6. Mild troponin elevation felt due to #1 7. Aspirin allergy  Secondary Discharge Diagnoses:  1. Renal cell carcinoma s/p Rt robotic assisted partial converted to radical nephrectomy on 01/2013 2. Gout  3. Kidney stornes 4. OSA on CPAP 5. High cholesterol 6. Asthma 7. Anxiety  Hospital Course: Mr. Hinz is a 38 y/o male with a h/o chronic systolic CHF due to presumed NICM (normal nuc 2013), renal cell carcinoma s/p right radical nephrectomy 2014, DM, morbid obesity, gout, HTN, sleep apnea, and tobacco abuse (quit 10 days ago). In 2012, he had an EF of 20-25%. He had a myoview in January 2013 that demonstrated normal perfusion, at the same time that 2D echo demonstrated improved systolic function with an EF of 45-50%. He had radical nephrectomy for RCC 2014 and says he was told that margins were clear (corroborated by path report). He does not follow with anyone for this. His last office visit was a year ago on 03/31/2013 with Dr. Harrington Challenger and at that time, he was felt to be stable from a cardiac standpoint.  He presented to [facility] on 04/05/2014 transferred from [facility] for evaluation of shortness of breath and chest pain. He stated he had run out of his medicines for 1-2 weeks, aside from insulin. He was in his usual state of health until 2 days prior to admission when he developed shortness of breath at rest, worse with  exertion. He reported orthopnea/PND, chest tightness that was not worse with exertion, and mild nausea. He denied weight gain, lower extremity edema, dietary indiscretion, vomiting, diaphoresis, dizziness, syncope/near-syncope. On day of admission the chest pain recurred severely 10/10 prompting him to seek medical attention. At the [facility], his EKG demonstrated normal sinus rhythm with lateral T wave inversions that appear to be new compared to prior EKGs. Troponin was minimally elevated at 0.33. BNP was also elevated at 2075. Chest x-ray demonstrated diffuse pulmonary edema. Admit Cr was 1.40. At [facility], he was given a dose of 40mg  IV Lasix, IV heparin and IV nitroglycerin with improvement in symptoms then transferred to [facility]. Symptoms were felt likely due to acute on chronic systolic CHF in the setting of accelerated HTN (BP 150/107 on admission). He was admitted for clinical observation to response to the above measures. He was started back on some of his home medicines. 2D echo showed severely dilated LV, EF 15%, akinesis of the basal-mid inferior and inferoseptal myocardium, restrictive physiology, mild focal basal hypertrophy of the septum, trivial MR, severely dilated LA, moderately reduced RV systolic function. TSH wnl. He was diuresed further. By day of discharge he has diuresed - 6.6L and weight 280->274lb. Further diuresis is limited by his renal function, as his Cr has risen to 1.85. He is now euvolemic. Dr. Harl Bowie has recommended to hold diuretic today and tomorrow morning, then resume diuretic tomorrow evening in the form of 20mg  BID. Care management saw the patient  who set him up to get his meds at [facility] with copay of 3-4$ per med. Strict medication adherence was discussed with the patient, along with necessity for outpatient followup.  With regard to his minimal troponin elevation (peak 0.38), this was suspected due to CHF rather than NSTEMI. Abnormal EKG was felt to be a  strain patern due to his LVH.  He is not on aspirin due to aspirin allergy. Dr. Harl Bowie had initially started Plavix but Dr. Harrington Challenger stopped it when his troponin plateaued. Dr. Harl Bowie felt that there was no urgent indication for cath at this time and requests that the patient follow up with his primary cardiologist to determine timing vs noninvasive evaluation, keeping in mind his renal insufficiency and prior nephrectomy. I have left a message on our office's scheduling voicemail requesting an early BMET in the next week, and TOC follow-up appointment. 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  Discharge Vitals: Blood pressure 102/80, pulse 72, temperature 97.8 F (36.6 C), temperature source Oral, resp. rate 18, height 5' 6.5" (1.689 m), weight 274 lb 9.6 oz (124.558 kg), SpO2 98.00%.  Labs: Lab Results  Component Value Date   WBC 10.3 04/07/2014   HGB 15.4 04/07/2014   HCT 46.0 04/07/2014   MCV 83.5 04/07/2014   PLT 269 04/07/2014    Recent Labs Lab 04/05/14 1525  04/08/14 0337  NA 138  < > 139  K 4.6  < > 4.4  CL 108  < > 101  CO2 16*  < > 24  BUN 13  < > 29*  CREATININE 1.40*  < > 1.85*  CALCIUM 9.1  < > 9.5  PROT 7.0  --   --   BILITOT 0.7  --   --   ALKPHOS 73  --   --   ALT 21  --   --   AST 32  --   --   GLUCOSE 102*  < > 79  < > = values in this interval not displayed.  Recent Labs  04/05/14 1525 04/06/14 1420  TROPONINI 0.33* 0.38*   Lab Results  Component Value Date   CHOL 142 03/31/2013   HDL 25.80* 03/31/2013   LDLCALC 87 03/31/2013   TRIG 144.0 03/31/2013    Diagnostic Studies/Procedures   Dg Chest Port 1 View 04/05/2014   CLINICAL DATA:  Short of breath  EXAM: PORTABLE CHEST - 1 VIEW  COMPARISON:  03/08/2013  FINDINGS: Diffuse bilateral airspace disease consistent with pulmonary edema. Negative for effusion. Mild cardiac enlargement.  IMPRESSION: Diffuse  pulmonary edema.   Electronically Signed   By: Franchot Gallo M.D.   On: 04/05/2014 15:13   2D echo 04/06/14 - Left ventricle: The cavity size was severely dilated. There was mild focal basal hypertrophy of the septum. Systolic function was severely reduced. The estimated ejection fraction was 15%. Diffuse hypokinesis. There is akinesis of the basal-midinferior and inferoseptal myocardium. Doppler parameters are consistent with restrictive physiology, indicative of decreased left ventricular diastolic compliance and/or increased left atrial pressure. - Mitral valve: Mildly thickened leaflets . There was trivial regurgitation. - Left atrium: The atrium was severely dilated. - Right ventricle: Systolic function was moderately reduced. - Right atrium: The atrium was mildly dilated. Central venous pressure (est): 3 mm Hg. - Atrial septum: No defect or patent foramen ovale was identified. - Tricuspid valve: There was trivial  regurgitation. - Pulmonary arteries: Systolic pressure could not be accurately estimated. - Pericardium, extracardiac: There was no pericardial effusion. Impressions: - Mild septal hypertrophy with severe LV chamber dilatation and LVEF approximately 15%. Diffuse hypokinesis with mid to basal inferoseptal akinesis. Restrictive diastolic filling pattern. Severe left atrial enlargement. Moderately reduced RV contraction. Unable to assess PASP.    Discharge Medications   Current Discharge Medication List    START taking these medications   Details  atorvastatin (LIPITOR) 80 MG tablet Take 1 tablet (80 mg total) by mouth every evening. Qty: 30 tablet, Refills: 3    carvedilol (COREG) 3.125 MG tablet Take 1 tablet (3.125 mg total) by mouth 2 (two) times daily with a meal. Qty: 60 tablet, Refills: 3    torsemide (DEMADEX) 20 MG tablet Take 1 tablet (20 mg total) by mouth 2 (two) times daily. Start the evening of 04/09/14. Qty: 60 tablet, Refills: 3      CONTINUE  these medications which have CHANGED   Details  lisinopril (PRINIVIL,ZESTRIL) 20 MG tablet Take 1 tablet (20 mg total) by mouth daily. Qty: 30 tablet, Refills: 3 Note - dose has not changed, just prescribed refills      CONTINUE these medications which have NOT CHANGED   Details  acetaminophen (TYLENOL) 500 MG tablet Take 1,000 mg by mouth every 6 (six) hours as needed for mild pain.    colchicine 0.6 MG tablet Take 0.6 mg by mouth daily as needed (for gout).    insulin NPH-regular (NOVOLIN 70/30) (70-30) 100 UNIT/ML injection Inject 34 Units into the skin 2 (two) times daily with a meal. Qty: 10 mL, Refills: 12    EPINEPHrine (EPIPEN) 0.3 mg/0.3 mL SOAJ injection Inject 0.3 mLs (0.3 mg total) into the muscle as needed (as needed for severe allergy).      STOP taking these medications     furosemide (LASIX) 40 MG tablet      gemfibrozil (LOPID) 600 MG tablet      metoprolol (LOPRESSOR) 50 MG tablet         Disposition   The patient will be discharged in stable condition to home.  Follow-up Information   Schedule an appointment as soon as possible for a visit with Primary Care Provider.      Follow up with [name], MD. (Our office will call you for a follow-up appointment. Please call the office if you have not heard from [place] within 3 days. You will also need repeat bloodwork within the next week to recheck your kidney function & electrolyes. We will call you to arrange.)    Specialty:  Cardiology   Contact information:   [place] [address] [phone]         Duration of Discharge Encounter: Greater than 30 minutes including physician and PA time.  Signed, Trever Streater PA-C 04/08/2014, 11:15 AM

## 2014-04-08 NOTE — Progress Notes (Signed)
Patient ID: Jeffrey [name], male   DOB: [birth year], 38 y.o.   MRN: [id number]     Subjective:   SOB improved. No chest pain   Objective:   Temp:  [97.8 F (36.6 C)-98.5 F (36.9 C)] 97.8 F (36.6 C) (07/05 0444) Pulse Rate:  [72-79] 72 (07/05 0444) Resp:  [18] 18 (07/05 0444) BP: (102-112)/(73-84) 102/80 mmHg (07/05 0444) SpO2:  [97 %-100 %] 98 % (07/05 0444) Weight:  [274 lb 9.6 oz (124.558 kg)] 274 lb 9.6 oz (124.558 kg) (07/05 0444) Last BM Date: 04/08/14  Filed Weights   04/06/14 0300 04/07/14 0350 04/08/14 0444  Weight: 278 lb 1.6 oz (126.145 kg) 278 lb 3.5 oz (126.2 kg) 274 lb 9.6 oz (124.558 kg)    Intake/Output Summary (Last 24 hours) at 04/08/14 0902 Last data filed at 04/08/14 0826  Gross per 24 hour  Intake   1440 ml  Output   5425 ml  Net  -3985 ml    Telemetry: NSR  Exam:  General: NAD  Resp:CTAB  Cardiac: RRR, no m/r/g, no JVD  GI: abdomen soft, NT, ND  MSK: no LE edema  Neuro: no focal deficits  Psych: appropriate affect  Lab Results:  Basic Metabolic Panel:  Recent Labs Lab 04/07/14 0340 04/07/14 0738 04/08/14 0337  NA 141 139 139  K 3.9 4.4 4.4  CL 106 105 101  CO2 21 19 24   GLUCOSE 96 107* 79  BUN 20 19 29*  CREATININE 1.64* 1.56* 1.85*  CALCIUM 9.1 9.1 9.5    Liver Function Tests:  Recent Labs Lab 04/05/14 1525  AST 32  ALT 21  ALKPHOS 73  BILITOT 0.7  PROT 7.0  ALBUMIN 3.0*    CBC:  Recent Labs Lab 04/05/14 1525 04/05/14 1548 04/06/14 0936 04/07/14 0340  WBC 11.6*  --  10.8* 10.3  HGB 14.7 16.0 15.2 15.4  HCT 43.3 47.0 45.3 46.0  MCV 80.9  --  83.4 83.5  PLT 257  --  263 269    Cardiac Enzymes:  Recent Labs Lab 04/05/14 1525 04/06/14 1420  TROPONINI 0.33* 0.38*    BNP:  Recent Labs  04/05/14 1525 04/06/14 1420 04/07/14 0018  PROBNP 2075.0* 1500.0* 1464.0*    Coagulation:  Recent Labs Lab 04/05/14 1723  INR 1.06    ECG:   Medications:   Scheduled Medications: .  atorvastatin  80 mg Oral q1800  . carvedilol  3.125 mg Oral BID WC  . furosemide  80 mg Intravenous Q12H  . insulin aspart protamine- aspart  34 Units Subcutaneous BID WC  . lisinopril  20 mg Oral Daily  . sodium chloride  3 mL Intravenous Q12H     Infusions:     PRN Medications:  sodium chloride, acetaminophen, guaiFENesin-dextromethorphan, ondansetron (ZOFRAN) IV, sodium chloride     Assessment/Plan    1. Acute on chronic systolic heart failure  - prior LVEF 20-25% by echo in 2012, LVEF had nearly normalized to 45-50% in 2013  - repeat echo this admit LVEF has once again decreased, now approx 15% with diffuse hypokinesis and restrictive diastolic dysfunction.  - mild trop of 0.38, potentially related to volume overload in setting of CKD. Trop with T-wave inversions in lateral precordial leads, likely strain pattern related to his LVH.  - he is negative 3.8  liters yesterday, total negative 6.6 liters since admission. Cr is trending up.  - symptoms significantly improved, euvolemic by exam  - plan for discharge today, we will ask that he  not take any further diuretic today or tomorrow morning, resume oral diuretic tomorrow evening. Will change to torsemide 20mg  bid. He will need to follow up with Dr Harrington Challenger in 2 weeks with a BMET prior to that visit.          Carlyle Dolly, M.D., F.A.C.C.

## 2014-04-08 NOTE — Progress Notes (Signed)
Pt discharged per MD order and protocol. Discharge instructions reviewed with patient and all questions answered. Pt aware all medications have been called into his pharmacy and will need to be picked up. Pt also aware of follow up appointments.

## 2014-04-09 LAB — GLUCOSE, CAPILLARY: GLUCOSE-CAPILLARY: 103 mg/dL — AB (ref 70–99)

## 2014-04-09 NOTE — ED Provider Notes (Signed)
Medical screening examination/treatment/procedure(s) were conducted as a shared visit with non-physician practitioner(s) and myself.  I personally evaluated the patient during the encounter.   EKG Interpretation   Date/Time:  Thursday April 05 2014 14:45:55 EDT Ventricular Rate:  94 PR Interval:  157 QRS Duration: 86 QT Interval:  353 QTC Calculation: 441 R Axis:   114 Text Interpretation:  Sinus rhythm LAE, consider biatrial enlargement  Right axis deviation Borderline repolarization abnormality Baseline wander  in lead(s) I III aVR aVL TWI laterally new since previous  Confirmed by  Zyah Gomm  MD, Selim Durden ([id number]) on 04/05/2014 3:56:14 PM      Jeffrey [name] is a 38 y.o. male hx of CHF with med uncompliance here with SOB. SOB worse with exertion and laying down. Hasn't been taking his meds. On exam, some peripheral edema. + crackles bilateral bases. Trop elevated. Allergic to ASA. Started on heparin drip. Also BNP elevated. Given lasix. Will admit for NSTEMI, CHF.   CRITICAL CARE Performed by: Darl Householder, Sophy Mesler   Total critical care time: 30 min   Critical care time was exclusive of separately billable procedures and treating other patients.  Critical care was necessary to treat or prevent imminent or life-threatening deterioration.  Critical care was time spent personally by me on the following activities: development of treatment plan with patient and/or surrogate as well as nursing, discussions with consultants, evaluation of patient's response to treatment, examination of patient, obtaining history from patient or surrogate, ordering and performing treatments and interventions, ordering and review of laboratory studies, ordering and review of radiographic studies, pulse oximetry and re-evaluation of patient's condition.    Wandra Arthurs, MD 04/09/14 [phone]

## 2014-04-11 ENCOUNTER — Telehealth: Payer: Self-pay | Admitting: Internal Medicine

## 2014-04-11 NOTE — Telephone Encounter (Signed)
New Message/TCM  TCM   Per after Hours  Pt is TCM  Appt scheduled for 04/18/2014 at 11:30 am

## 2014-04-11 NOTE — Telephone Encounter (Signed)
Called pt multiple times for TCM info.  Pt has no minutes available on phone and unable to LM.

## 2014-04-12 NOTE — Telephone Encounter (Signed)
Called patient's number.  It sounds like someone answers but does not speak.  Call was ended by other person. I called back and received message to call [phone]; message states patient is out of minutes and can only receive texts; I left a voice message

## 2014-04-13 ENCOUNTER — Other Ambulatory Visit: Payer: Self-pay

## 2014-04-16 ENCOUNTER — Other Ambulatory Visit (INDEPENDENT_AMBULATORY_CARE_PROVIDER_SITE_OTHER): Payer: Medicaid Other

## 2014-04-16 DIAGNOSIS — N182 Chronic kidney disease, stage 2 (mild): Secondary | ICD-10-CM

## 2014-04-16 LAB — BASIC METABOLIC PANEL
BUN: 28 mg/dL — ABNORMAL HIGH (ref 6–23)
CALCIUM: 9.1 mg/dL (ref 8.4–10.5)
CHLORIDE: 110 meq/L (ref 96–112)
CO2: 27 meq/L (ref 19–32)
Creatinine, Ser: 1.8 mg/dL — ABNORMAL HIGH (ref 0.4–1.5)
GFR: 53.14 mL/min — ABNORMAL LOW (ref >60.00)
GLUCOSE: 95 mg/dL (ref 70–99)
POTASSIUM: 4.1 meq/L (ref 3.5–5.1)
SODIUM: 140 meq/L (ref 135–145)

## 2014-04-18 ENCOUNTER — Ambulatory Visit (INDEPENDENT_AMBULATORY_CARE_PROVIDER_SITE_OTHER): Payer: Medicaid Other | Admitting: Nurse Practitioner

## 2014-04-18 ENCOUNTER — Encounter: Payer: Self-pay | Admitting: Nurse Practitioner

## 2014-04-18 VITALS — BP 110/80 | HR 78 | Ht 67.0 in | Wt 284.8 lb

## 2014-04-18 DIAGNOSIS — R0989 Other specified symptoms and signs involving the circulatory and respiratory systems: Secondary | ICD-10-CM

## 2014-04-18 DIAGNOSIS — I5022 Chronic systolic (congestive) heart failure: Secondary | ICD-10-CM

## 2014-04-18 DIAGNOSIS — R0609 Other forms of dyspnea: Secondary | ICD-10-CM

## 2014-04-18 DIAGNOSIS — R778 Other specified abnormalities of plasma proteins: Secondary | ICD-10-CM

## 2014-04-18 DIAGNOSIS — R7989 Other specified abnormal findings of blood chemistry: Secondary | ICD-10-CM

## 2014-04-18 DIAGNOSIS — R0600 Dyspnea, unspecified: Secondary | ICD-10-CM

## 2014-04-18 LAB — BASIC METABOLIC PANEL
BUN: 24 mg/dL — ABNORMAL HIGH (ref 6–23)
CO2: 27 mEq/L (ref 19–32)
Calcium: 9.5 mg/dL (ref 8.4–10.5)
Chloride: 106 mEq/L (ref 96–112)
Creatinine, Ser: 1.8 mg/dL — ABNORMAL HIGH (ref 0.4–1.5)
GFR: 53.14 mL/min — ABNORMAL LOW (ref >60.00)
Glucose, Bld: 104 mg/dL — ABNORMAL HIGH (ref 70–99)
Potassium: 3.9 mEq/L (ref 3.5–5.1)
Sodium: 139 mEq/L (ref 135–145)

## 2014-04-18 LAB — BRAIN NATRIURETIC PEPTIDE: Pro B Natriuretic peptide (BNP): 72 pg/mL (ref 0.0–100.0)

## 2014-04-18 MED ORDER — CARVEDILOL 6.25 MG PO TABS: 6.2500 mg | ORAL_TABLET | Freq: Two times a day (BID) | ORAL | Status: DC

## 2014-04-18 NOTE — Progress Notes (Signed)
[name] Date of Birth: [birth year] Medical Record #[id number]  History of Present Illness: Mr. Gehres is seen back today for a post hospital/TOC visit. Seen for Dr. Harrington Challenger. He has chronic systolic HF - presumed NICM - normal nuclear in 2013, renal cell carcinoma with prior right nephrectomy in 2014, DM, morbid obesity, gout, HTN, sleep apnea and tobacco abuse. He had had improvement in his EF - up to 40 to 45% per echo in 2013.   Most recently presented with worsening shortness of breath and chest pain - had been out of his medicines for a couple of weeks - showed up in heart failure/pulmonary edema. Was diuresed. Started back on his medicines. Minimal rise in troponin - felt to be due to CHF and not NSTEMI. Was to follow up with outpatient cardiology for consideration of repeat noninvasive testing versus cardiac cath - has CKD and has had prior nephrectomy.  Comes in today. Here alone. He says he is feeling great. Walking 2 miles per day. Not short of breath. No swelling. No chest pain. Worried about having a cath due to his one kidney and that that kidney is "stressed". Remains on ACE. Weight is up - he does not weigh daily at home. Not smoking. Says he is much better overall. Understands the need to stay compliant with his medicines.   Current Outpatient Prescriptions  Medication Sig Dispense Refill  . acetaminophen (TYLENOL) 500 MG tablet Take 1,000 mg by mouth every 6 (six) hours as needed for mild pain.      [name] atorvastatin (LIPITOR) 80 MG tablet Take 1 tablet (80 mg total) by mouth every evening.  30 tablet  3  . carvedilol (COREG) 3.125 MG tablet Take 1 tablet (3.125 mg total) by mouth 2 (two) times daily with a meal.  60 tablet  3  . colchicine 0.6 MG tablet Take 0.6 mg by mouth daily as needed (for gout).      [name] EPINEPHrine (EPIPEN) 0.3 mg/0.3 mL SOAJ injection Inject 0.3 mLs (0.3 mg total) into the muscle as needed (as needed for severe allergy).  1 Device  0  . insulin NPH-regular  (NOVOLIN 70/30) (70-30) 100 UNIT/ML injection Inject 34 Units into the skin 2 (two) times daily with a meal.  10 mL  12  . lisinopril (PRINIVIL,ZESTRIL) 20 MG tablet Take 1 tablet (20 mg total) by mouth daily.  30 tablet  3  . torsemide (DEMADEX) 20 MG tablet Take 1 tablet (20 mg total) by mouth 2 (two) times daily. Start the evening of 04/09/14.  60 tablet  3  . [DISCONTINUED] cetirizine (ZYRTEC ALLERGY) 10 MG tablet Take 1 tablet (10 mg total) by mouth daily.  30 tablet  1   No current facility-administered medications for this visit.    Allergies  Allergen Reactions  . Aspirin Shortness Of Breath and Rash    Rash, itching, burning sensation  (Patient reports he tolerates other NSAIDS)   . Bee Venom Hives and Swelling  . Tomato Rash    Past Medical History  Diagnosis Date  . Hypertension   . Morbid obesity   . History of gout   . Chronic systolic CHF (congestive heart failure)     a. EF 20-25% in 2012. b. EF 45-50% in 10/2011 with nonischemic nuc - presumed NICM. c. 04/2014: severely dilated LV, EF 15%, akinesis of the basal-mid inferior and inferoseptal myocardium, restrictive physiology, mild focal basal hypertrophy of the septum, trivial MR, severely dilated LA, moderately reduced RV  systolic fcn.  . Kidney stones   . OSA on CPAP   . High cholesterol   . Childhood asthma   . Type II diabetes mellitus   . Anxiety   . Renal cell carcinoma     a. s/p Rt robotic assisted partial converted to radical nephrectomy on 01/2013.  . Troponin level elevated     a. 04/2014: felt due to CHF.  . CKD (chronic kidney disease) stage 2, GFR 60-89 ml/min   . Aspirin allergy     Past Surgical History  Procedure Laterality Date  . Robotic assited partial nephrectomy Right 01/13/2013    Procedure: ROBOTIC ASSITED PARTIAL NEPHRECTOMY CONVERTED TO ROBOTIC ASSISTED RIGHT RADICAL NEPHRECTOMY;  Surgeon: Alexis Frock, MD;  Location: [facility];  Service: Urology;  Laterality: Right;  . Robotic assisted  laparoscopic lysis of adhesion  01/13/2013    Procedure: ROBOTIC ASSISTED LAPAROSCOPIC LYSIS OF ADHESION EXTENSIVE;  Surgeon: Alexis Frock, MD;  Location: [facility];  Service: Urology;;  . Appendectomy  07/2004    History  Smoking status  . Former Smoker -- 0.25 packs/day for 22 years  . Types: Cigarettes  . Quit date: 03/27/2014  Smokeless tobacco  . Never Used    History  Alcohol Use  . 9.6 oz/week  . 65 Shots of liquor per week    Comment: 04/05/2014 "1/2 pint/weekend nite; total ~ 1 1/2 pint brandy"    Family History  Problem Relation Age of Onset  . Diabetes Other   . Hypertension Other     Review of Systems: The review of systems is per the HPI.  All other systems were reviewed and are negative.  Physical Exam: BP 110/80  Pulse 78  Ht 5\' 7"  (1.702 m)  Wt 284 lb 12.8 oz (129.184 kg)  BMI 44.60 kg/m2  SpO2 96% Patient is very pleasant and in no acute distress. Already back up 10 pounds since discharge. He remains obese. Skin is warm and dry. Color is normal.  HEENT is unremarkable. Normocephalic/atraumatic. PERRL. Sclera are nonicteric. Neck is supple. No masses. No JVD. Lungs are clear. Cardiac exam shows a regular rate and rhythm. Abdomen is soft. Extremities are without edema. Gait and ROM are intact. No gross neurologic deficits noted.  Wt Readings from Last 3 Encounters:  04/18/14 284 lb 12.8 oz (129.184 kg)  04/08/14 274 lb 9.6 oz (124.558 kg)  07/26/13 293 lb (132.904 kg)    LABORATORY DATA/PROCEDURES:  Lab Results  Component Value Date   WBC 10.3 04/07/2014   HGB 15.4 04/07/2014   HCT 46.0 04/07/2014   PLT 269 04/07/2014   GLUCOSE 95 04/16/2014   CHOL 142 03/31/2013   TRIG 144.0 03/31/2013   HDL 25.80* 03/31/2013   LDLCALC 87 03/31/2013   ALT 21 04/05/2014   AST 32 04/05/2014   NA 140 04/16/2014   K 4.1 04/16/2014   CL 110 04/16/2014   CREATININE 1.8* 04/16/2014   BUN 28* 04/16/2014   CO2 27 04/16/2014   TSH 2.290 04/08/2014   INR 1.06 04/05/2014   HGBA1C 6.3*  07/26/2013    BNP (last 3 results)  Recent Labs  04/05/14 1525 04/06/14 1420 04/07/14 0018  PROBNP 2075.0* 1500.0* 1464.0*   Echo Study Conclusions from July 2015  - Left ventricle: The cavity size was severely dilated. There was mild focal basal hypertrophy of the septum. Systolic function was severely reduced. The estimated ejection fraction was 15%. Diffuse hypokinesis. There is akinesis of the basal-midinferior and inferoseptal myocardium. Doppler parameters are  consistent with restrictive physiology, indicative of decreased left ventricular diastolic compliance and/or increased left atrial pressure. - Mitral valve: Mildly thickened leaflets . There was trivial regurgitation. - Left atrium: The atrium was severely dilated. - Right ventricle: Systolic function was moderately reduced. - Right atrium: The atrium was mildly dilated. Central venous pressure (est): 3 mm Hg. - Atrial septum: No defect or patent foramen ovale was identified. - Tricuspid valve: There was trivial regurgitation. - Pulmonary arteries: Systolic pressure could not be accurately estimated. - Pericardium, extracardiac: There was no pericardial effusion.  Impressions:  - Mild septal hypertrophy with severe LV chamber dilatation and LVEF approximately 15%. Diffuse hypokinesis with mid to basal inferoseptal akinesis. Restrictive diastolic filling pattern. Severe left atrial enlargement. Moderately reduced RV contraction. Unable to assess PASP.  Lab Results  Component Value Date   CKTOTAL 177 11/10/2011   CKMB 1.7 10/23/2011   TROPONINI 0.38* 04/06/2014   Myoview Findings from 2013: Normal myocardial perfusion on the stress images. There  is no evidence for a fixed or reversible defect. The wall motion  demonstrates diffuse hypokinesia. The end diastolic volume is 785  ml and end-systolic volume is 885 ml. The calculated ejection  fraction is 36%.  IMPRESSION:  No evidence for pharmacologically  induced ischemia.  Diffuse hypokinesia with ejection fraction of 36%.  Original Report Authenticated By: Markus Daft, M.D.   Assessment / Plan: 1. Acute on chronic systolic heart failure - due to noncompliance with medicines - weight is up - needs repeat labs - would consider ICD implant if he can demonstrate compliance. Recheck echo 3 months after getting back on target doses - Coreg is increased today to 6.25 mg BID. May need to change his ACE to hydralazine/nitrates.   2. Mildly elevated troponin - was felt to be related to his HF - I would favor repeat Myoview before proceeding on with cath.   3. CKD - needs labs rechecked today.   4. DM  5. Tobacco abuse -  Currently not stopping  6. Prior nephrectomy for renal cell carcinoma - no follow up - reminded of the need to see urology at least yearly. Has had recent CXR with no obvious mets.   Patient is agreeable to this plan and will call if any problems develop in the interim.   Burtis Junes, RN, [facility] [address] [place], [place]  [address] [phone]

## 2014-04-18 NOTE — Patient Instructions (Signed)
Congrats for not smoking  Keep restricting your salt  Stay on your current medicines but increase the Coreg to 6.25 mg twice a day - you may take 2 of your 3.125 mg tablets twice a day and use those up - I will give you a RX for the actual 6.25 mg tablet  Weigh daily - Take extra dose of diuretic for weight gain of 3 pounds in 24 hours.   We will arrange or a stress test Carlton Adam)  We will check labs today  Follow up with Dr. Harrington Challenger  Call the [place] office at [phone] if you have any questions, problems or concerns.

## 2014-04-25 ENCOUNTER — Ambulatory Visit (HOSPITAL_COMMUNITY): Payer: Medicaid Other | Attending: Cardiovascular Disease | Admitting: Radiology

## 2014-04-25 VITALS — BP 99/79 | HR 73 | Ht 67.0 in | Wt 289.0 lb

## 2014-04-25 DIAGNOSIS — G4733 Obstructive sleep apnea (adult) (pediatric): Secondary | ICD-10-CM | POA: Insufficient documentation

## 2014-04-25 DIAGNOSIS — R079 Chest pain, unspecified: Secondary | ICD-10-CM

## 2014-04-25 DIAGNOSIS — N189 Chronic kidney disease, unspecified: Secondary | ICD-10-CM | POA: Diagnosis not present

## 2014-04-25 DIAGNOSIS — I509 Heart failure, unspecified: Secondary | ICD-10-CM | POA: Diagnosis not present

## 2014-04-25 DIAGNOSIS — E119 Type 2 diabetes mellitus without complications: Secondary | ICD-10-CM | POA: Diagnosis not present

## 2014-04-25 DIAGNOSIS — R778 Other specified abnormalities of plasma proteins: Secondary | ICD-10-CM

## 2014-04-25 DIAGNOSIS — R0600 Dyspnea, unspecified: Secondary | ICD-10-CM

## 2014-04-25 DIAGNOSIS — I10 Essential (primary) hypertension: Secondary | ICD-10-CM | POA: Diagnosis present

## 2014-04-25 DIAGNOSIS — I129 Hypertensive chronic kidney disease with stage 1 through stage 4 chronic kidney disease, or unspecified chronic kidney disease: Secondary | ICD-10-CM | POA: Diagnosis not present

## 2014-04-25 DIAGNOSIS — R7989 Other specified abnormal findings of blood chemistry: Secondary | ICD-10-CM

## 2014-04-25 DIAGNOSIS — I5022 Chronic systolic (congestive) heart failure: Secondary | ICD-10-CM

## 2014-04-25 DIAGNOSIS — R0602 Shortness of breath: Secondary | ICD-10-CM

## 2014-04-25 MED ORDER — REGADENOSON 0.4 MG/5ML IV SOLN
0.4000 mg | Freq: Once | INTRAVENOUS | Status: AC
  Administered 2014-04-25: 0.4 mg via INTRAVENOUS

## 2014-04-25 MED ORDER — TECHNETIUM TC 99M SESTAMIBI GENERIC - CARDIOLITE
33.0000 | Freq: Once | INTRAVENOUS | Status: AC | PRN
  Administered 2014-04-25: 33 via INTRAVENOUS

## 2014-04-25 NOTE — Progress Notes (Signed)
[facility] [address] [facility] [place], [place] [address] [phone]    Cardiology Nuclear Med Study  Jeffrey [name] is a 38 y.o. male     MRN : [id number]     DOB: [birth year]  Procedure Date: 04/25/2014  Nuclear Med Background Indication for Stress Test:  Evaluation for Ischemia, and Patient seen in hospital on 04-05-2014 for Chest Pain,SOB, CHF, Pulmonary Edema,  and minimal rise in Troponin felt due to CHF, not NSTEMI History:  No known CAD, CHF, OSA, CKD (chronic), Echo 2013 EF 40-45%, MPI 2013 (normal) EF 36% Cardiac Risk Factors: History of Smoking, Hypertension, IDDM, and Lipids  Symptoms:  Chest Pain, Chest Pain with Exertion and DOE   Nuclear Pre-Procedure Caffeine/Decaff Intake:  None> 12 hrs NPO After: 10:30pm   Lungs:  clear O2 Sat: 93% on room air. IV 0.9% NS with Angio Cath:  22g  IV Site: R Wrist x 1, tolerated well IV Started by:  Jeffrey Baltimore, RN  Chest Size (in):  50 Cup Size: n/a  Height: 5\' 7"  (1.702 m)  Weight:  289 lb (131.09 kg)  BMI:  Body mass index is 45.25 kg/(m^2). Tech Comments:  1/2 dose Insulin last night, no Insulin today. No medications (Coreg) today. Fasting CBG was 90 at 0500 today. Jeffrey Baltimore, RN.    Nuclear Med Study 1 or 2 day study: 2 day  Stress Test Type:  Treadmill/Lexiscan  Reading MD: N/A  Order Authorizing Provider:  [name], MD  Resting Radionuclide: Technetium 58m Sestamibi  Resting Radionuclide Dose: 33.0 mCi on 05/01/14   Stress Radionuclide:  Technetium 52m Sestamibi  Stress Radionuclide Dose: 33.0 mCi on 04/25/14          Stress Protocol Rest HR: 73 Stress HR: 100  Rest BP: 99/79 Stress BP: 109/85  Exercise Time (min): n/a METS: n/a           Dose of Adenosine (mg):  n/a Dose of Lexiscan: 0.4 mg  Dose of Atropine (mg): n/a Dose of Dobutamine: n/a mcg/kg/min (at max HR)  Stress Test Technologist: Jeffrey [name], BS-ES  Nuclear Technologist:  Jeffrey [name], CNMT     Rest Procedure:   Myocardial perfusion imaging was performed at rest 45 minutes following the intravenous administration of Technetium 46m Sestamibi. Rest ECG: Normal sinus rhythm. Normal EKG  Stress Procedure:  The patient received IV Lexiscan 0.4 mg over 15-seconds with concurrent low level exercise and then Technetium 67m Sestamibi was injected at 30-seconds while the patient continued walking one more minute.  Quantitative spect images were obtained after a 45-minute delay.  During the infusion of Lexiscan the patient complained of chest tightness.  This resovled in recovery.  Stress ECG: No significant change from baseline ECG  QPS Raw Data Images:  Normal; no motion artifact; normal heart/lung ratio. Stress Images:  There is a medium size area of moderate decreased uptake affecting the mid/apical inferior segments, apical cap, and the apical anterior segment. There is no significant reversibility. Rest Images:  Rest images are the same as stress images. Subtraction (SDS):  No evidence of ischemia. Transient Ischemic Dilatation (Normal <1.22):  0.94 Lung/Heart Ratio (Normal <0.45):  0.35  Quantitative Gated Spect Images QGS EDV:  325 ml QGS ESV:  242 ml  Impression Exercise Capacity:  Lexiscan with low level exercise. BP Response:  Normal blood pressure response. Clinical Symptoms:  Chest tightness ECG Impression:  No significant ST segment change suggestive of ischemia. Comparison with Prior Nuclear Study:  No images to compare  Overall Impression:  There is severe global hypokinesis. The left ventricle is dilated. There are areas with decreased activity which may represent scar. In the information above the history relates a nuclear scan in 2013 that was said to be normal with decreased ejection fraction. The current images are not normal. I cannot rule out significant scar and the possibility of very slight ischemia.  LV Ejection Fraction: 26%.  LV Wall Motion:  Global hypokinesis.  Jeffrey Argyle,  MD

## 2014-05-01 ENCOUNTER — Encounter (HOSPITAL_COMMUNITY): Payer: Medicaid Other | Attending: Internal Medicine

## 2014-05-01 ENCOUNTER — Encounter (HOSPITAL_COMMUNITY): Payer: Self-pay

## 2014-05-01 DIAGNOSIS — R0989 Other specified symptoms and signs involving the circulatory and respiratory systems: Secondary | ICD-10-CM

## 2014-05-01 MED ORDER — TECHNETIUM TC 99M SESTAMIBI GENERIC - CARDIOLITE
33.0000 | Freq: Once | INTRAVENOUS | Status: AC | PRN
  Administered 2014-05-01: 33 via INTRAVENOUS

## 2014-05-09 ENCOUNTER — Telehealth: Payer: Self-pay | Admitting: *Deleted

## 2014-05-09 NOTE — Telephone Encounter (Signed)
Pt is aware of stress test results is agreeable to plan

## 2014-05-09 NOTE — Telephone Encounter (Signed)
Message copied by Tamsen Snider on Wed May 09, 2014  9:42 AM ------      Message from: Burtis Junes      Created: Mon May 07, 2014  4:11 PM       Ok to let him know that Dr. Harrington Challenger has looked at his stress test and feels that it is ok - she wishes to continue with medical management and plan to repeat his echo in 3 months.             Needs to keep his follow up as planned. ------

## 2014-05-31 NOTE — Progress Notes (Signed)
Jeffrey [name] Date of Birth: [birth year] Medical Record #[id number]  History of Present Illness: Jeffrey [name] is seen back today for a post hospital/TOC visit. Seen for Dr. Harrington Challenger. He has chronic systolic HF - presumed NICM - normal nuclear in 2013, renal cell carcinoma with prior right nephrectomy in 2014, DM, morbid obesity, gout, HTN, sleep apnea and tobacco abuse. He had had improvement in his EF - up to 40 to 45% per echo in 2013.   Most recently presented with worsening shortness of breath and chest pain - had been out of his medicines for a couple of weeks - showed up in heart failure/pulmonary edema. Was diuresed. Started back on his medicines. Minimal rise in troponin - felt to be due to CHF and not NSTEMI. Was to follow up with outpatient cardiology for consideration of repeat noninvasive testing versus cardiac cath - has CKD and has had prior nephrectomy.  Comes in today. Here alone. He says he is feeling great. Walking 2 miles per day. Not short of breath. No swelling. No chest pain. Worried about having a cath due to his one kidney and that that kidney is "stressed". Remains on ACE. Weight is up - he does not weigh daily at home. Not smoking. Says he is much better overall. Understands the need to stay compliant with his medicines.   Current Outpatient Prescriptions  Medication Sig Dispense Refill  . acetaminophen (TYLENOL) 500 MG tablet Take 1,000 mg by mouth every 6 (six) hours as needed for mild pain.      [name] atorvastatin (LIPITOR) 80 MG tablet Take 1 tablet (80 mg total) by mouth every evening.  30 tablet  3  . carvedilol (COREG) 6.25 MG tablet Take 1 tablet (6.25 mg total) by mouth 2 (two) times daily.  60 tablet  3  . colchicine 0.6 MG tablet Take 0.6 mg by mouth daily as needed (for gout).      [name] EPINEPHrine (EPIPEN) 0.3 mg/0.3 mL SOAJ injection Inject 0.3 mLs (0.3 mg total) into the muscle as needed (as needed for severe allergy).  1 Device  0  . insulin NPH-regular (NOVOLIN 70/30)  (70-30) 100 UNIT/ML injection Inject 34 Units into the skin 2 (two) times daily with a meal.  10 mL  12  . lisinopril (PRINIVIL,ZESTRIL) 20 MG tablet Take 1 tablet (20 mg total) by mouth daily.  30 tablet  3  . torsemide (DEMADEX) 20 MG tablet Take 1 tablet (20 mg total) by mouth 2 (two) times daily. Start the evening of 04/09/14.  60 tablet  3  . [DISCONTINUED] cetirizine (ZYRTEC ALLERGY) 10 MG tablet Take 1 tablet (10 mg total) by mouth daily.  30 tablet  1   No current facility-administered medications for this visit.    Allergies  Allergen Reactions  . Aspirin Shortness Of Breath and Rash    Rash, itching, burning sensation  (Patient reports he tolerates other NSAIDS)   . Bee Venom Hives and Swelling  . Tomato Rash    Past Medical History  Diagnosis Date  . Hypertension   . Morbid obesity   . History of gout   . Chronic systolic CHF (congestive heart failure)     a. EF 20-25% in 2012. b. EF 45-50% in 10/2011 with nonischemic nuc - presumed NICM. c. 04/2014: severely dilated LV, EF 15%, akinesis of the basal-mid inferior and inferoseptal myocardium, restrictive physiology, mild focal basal hypertrophy of the septum, trivial MR, severely dilated LA, moderately reduced RV systolic fcn.  [name]  Kidney stones   . OSA on CPAP   . High cholesterol   . Childhood asthma   . Type II diabetes mellitus   . Anxiety   . Renal cell carcinoma     a. s/p Rt robotic assisted partial converted to radical nephrectomy on 01/2013.  . Troponin level elevated     a. 04/2014: felt due to CHF.  . CKD (chronic kidney disease) stage 2, GFR 60-89 ml/min   . Aspirin allergy     Past Surgical History  Procedure Laterality Date  . Robotic assited partial nephrectomy Right 01/13/2013    Procedure: ROBOTIC ASSITED PARTIAL NEPHRECTOMY CONVERTED TO ROBOTIC ASSISTED RIGHT RADICAL NEPHRECTOMY;  Surgeon: Alexis Frock, MD;  Location: [facility];  Service: Urology;  Laterality: Right;  . Robotic assisted laparoscopic lysis  of adhesion  01/13/2013    Procedure: ROBOTIC ASSISTED LAPAROSCOPIC LYSIS OF ADHESION EXTENSIVE;  Surgeon: Alexis Frock, MD;  Location: [facility];  Service: Urology;;  . Appendectomy  07/2004    History  Smoking status  . Former Smoker -- 0.25 packs/day for 22 years  . Types: Cigarettes  . Quit date: 03/27/2014  Smokeless tobacco  . Never Used    History  Alcohol Use  . 9.6 oz/week  . 15 Shots of liquor per week    Comment: 04/05/2014 "1/2 pint/weekend nite; total ~ 1 1/2 pint brandy"    Family History  Problem Relation Age of Onset  . Diabetes Other   . Hypertension Other     Review of Systems: The review of systems is per the HPI.  All other systems were reviewed and are negative.  Physical Exam: There were no vitals taken for this visit. Patient is very pleasant and in no acute distress. Already back up 10 pounds since discharge. He remains obese. Skin is warm and dry. Color is normal.  HEENT is unremarkable. Normocephalic/atraumatic. PERRL. Sclera are nonicteric. Neck is supple. No masses. No JVD. Lungs are clear. Cardiac exam shows a regular rate and rhythm. Abdomen is soft. Extremities are without edema. Gait and ROM are intact. No gross neurologic deficits noted.  Wt Readings from Last 3 Encounters:  04/25/14 289 lb (131.09 kg)  04/18/14 284 lb 12.8 oz (129.184 kg)  04/08/14 274 lb 9.6 oz (124.558 kg)    LABORATORY DATA/PROCEDURES:  Lab Results  Component Value Date   WBC 10.3 04/07/2014   HGB 15.4 04/07/2014   HCT 46.0 04/07/2014   PLT 269 04/07/2014   GLUCOSE 104* 04/18/2014   CHOL 142 03/31/2013   TRIG 144.0 03/31/2013   HDL 25.80* 03/31/2013   LDLCALC 87 03/31/2013   ALT 21 04/05/2014   AST 32 04/05/2014   NA 139 04/18/2014   K 3.9 04/18/2014   CL 106 04/18/2014   CREATININE 1.8* 04/18/2014   BUN 24* 04/18/2014   CO2 27 04/18/2014   TSH 2.290 04/08/2014   INR 1.06 04/05/2014   HGBA1C 6.3* 07/26/2013    BNP (last 3 results)  Recent Labs  04/06/14 1420 04/07/14 0018  04/18/14 1203  PROBNP 1500.0* 1464.0* 72.0   Echo Study Conclusions from July 2015  - Left ventricle: The cavity size was severely dilated. There was mild focal basal hypertrophy of the septum. Systolic function was severely reduced. The estimated ejection fraction was 15%. Diffuse hypokinesis. There is akinesis of the basal-midinferior and inferoseptal myocardium. Doppler parameters are consistent with restrictive physiology, indicative of decreased left ventricular diastolic compliance and/or increased left atrial pressure. - Mitral valve: Mildly thickened leaflets .  There was trivial regurgitation. - Left atrium: The atrium was severely dilated. - Right ventricle: Systolic function was moderately reduced. - Right atrium: The atrium was mildly dilated. Central venous pressure (est): 3 mm Hg. - Atrial septum: No defect or patent foramen ovale was identified. - Tricuspid valve: There was trivial regurgitation. - Pulmonary arteries: Systolic pressure could not be accurately estimated. - Pericardium, extracardiac: There was no pericardial effusion.  Impressions:  - Mild septal hypertrophy with severe LV chamber dilatation and LVEF approximately 15%. Diffuse hypokinesis with mid to basal inferoseptal akinesis. Restrictive diastolic filling pattern. Severe left atrial enlargement. Moderately reduced RV contraction. Unable to assess PASP.  Lab Results  Component Value Date   CKTOTAL 177 11/10/2011   CKMB 1.7 10/23/2011   TROPONINI 0.38* 04/06/2014   Myoview Findings from 2013: Normal myocardial perfusion on the stress images. There  is no evidence for a fixed or reversible defect. The wall motion  demonstrates diffuse hypokinesia. The end diastolic volume is 734  ml and end-systolic volume is 193 ml. The calculated ejection  fraction is 36%.  IMPRESSION:  No evidence for pharmacologically induced ischemia.  Diffuse hypokinesia with ejection fraction of 36%.  Original Report  Authenticated By: Markus Daft, M.D.   Assessment / Plan: 1. Acute on chronic systolic heart failure - due to noncompliance with medicines - weight is up - needs repeat labs - would consider ICD implant if he can demonstrate compliance. Recheck echo 3 months after getting back on target doses - Coreg is increased today to 6.25 mg BID. May need to change his ACE to hydralazine/nitrates.   2. Mildly elevated troponin - was felt to be related to his HF - I would favor repeat Myoview before proceeding on with cath.   3. CKD - needs labs rechecked today.   4. DM  5. Tobacco abuse -  Currently not stopping  6. Prior nephrectomy for renal cell carcinoma - no follow up - reminded of the need to see urology at least yearly. Has had recent CXR with no obvious mets.   Patient is agreeable to this plan and will call if any problems develop in the interim.   Burtis Junes, RN, [facility] [address] [place], [place]  [address] [phone]      This encounter was created in error - please disregard. This encounter was created in error - please disregard.

## 2014-06-01 ENCOUNTER — Encounter: Payer: Self-pay | Admitting: Internal Medicine

## 2014-07-04 NOTE — Progress Notes (Signed)
Jeffrey [name] Date of Birth: Mar 13, 1976 Medical Record #[id number]  History of Present Illness:  Patinet is a 38 yo with history of presumed NICM, noncompliance, renal cell CA (s/p nephrectomy), DM, gout, HTN, sleep apnea and tobacco use. He was admitted this summer with SOB and chest pain.  Started back on meds.  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 acetaminophen (TYLENOL) 500 MG tablet Take 1,000 mg by mouth every 6 (six) hours as needed for mild pain.      [name] atorvastatin (LIPITOR) 80 MG tablet Take 1 tablet (80 mg total) by mouth every evening.  30 tablet  3  . carvedilol (COREG) 6.25 MG tablet Take 1 tablet (6.25 mg total) by mouth 2 (two) times daily.  60 tablet  3  . colchicine 0.6 MG tablet Take 0.6 mg by mouth daily as needed (for gout).      [name] EPINEPHrine (EPIPEN) 0.3 mg/0.3 mL SOAJ injection Inject 0.3 mLs (0.3 mg total) into the muscle as needed (as needed for severe allergy).  1 Device  0  . insulin NPH-regular (NOVOLIN 70/30) (70-30) 100 UNIT/ML injection Inject 34 Units into the skin 2 (two) times daily with a meal.  10 mL  12  . lisinopril (PRINIVIL,ZESTRIL) 20 MG tablet Take 1 tablet (20 mg total) by mouth daily.  30 tablet  3  . torsemide (DEMADEX) 20 MG tablet Take 1 tablet (20 mg total) by mouth 2 (two) times daily. Start the evening of 04/09/14.  60 tablet  3  . [DISCONTINUED] cetirizine (ZYRTEC ALLERGY) 10 MG tablet Take 1 tablet (10 mg total) by mouth daily.  30 tablet  1   No current facility-administered medications for this visit.    Allergies  Allergen Reactions  . Aspirin Shortness Of Breath and Rash    Rash, itching, burning sensation  (Patient reports he tolerates other  NSAIDS)   . Bee Venom Hives and Swelling  . Tomato Rash    Past Medical History  Diagnosis Date  . Hypertension   . Morbid obesity   . History of gout   . Chronic systolic CHF (congestive heart failure)     a. EF 20-25% in 2012. b. EF 45-50% in 10/2011 with nonischemic nuc - presumed NICM. c. 04/2014: severely dilated LV, EF 15%, akinesis of the basal-mid inferior and inferoseptal myocardium, restrictive physiology, mild focal basal hypertrophy of the septum, trivial MR, severely dilated LA, moderately reduced RV systolic fcn.  . Kidney stones   . OSA on CPAP   . High cholesterol   . Childhood asthma   . Type II diabetes mellitus   . Anxiety   . Renal cell carcinoma     a. s/p Rt robotic assisted partial converted to radical nephrectomy on 01/2013.  . Troponin level elevated     a. 04/2014: felt due to CHF.  . CKD (chronic kidney disease) stage 2, GFR 60-89 ml/min   . Aspirin allergy     Past Surgical History  Procedure Laterality Date  . Robotic assited partial nephrectomy Right 01/13/2013    Procedure: ROBOTIC ASSITED PARTIAL NEPHRECTOMY CONVERTED TO  ROBOTIC ASSISTED RIGHT RADICAL NEPHRECTOMY;  Surgeon: Alexis Frock, MD;  Location: [facility];  Service: Urology;  Laterality: Right;  . Robotic assisted laparoscopic lysis of adhesion  01/13/2013    Procedure: ROBOTIC ASSISTED LAPAROSCOPIC LYSIS OF ADHESION EXTENSIVE;  Surgeon: Alexis Frock, MD;  Location: [facility];  Service: Urology;;  . Appendectomy  07/2004    History  Smoking status  . Former Smoker -- 0.25 packs/day for 22 years  . Types: Cigarettes  . Quit date: 03/27/2014  Smokeless tobacco  . Never Used    History  Alcohol Use  . 9.6 oz/week  . 78 Shots of liquor per week    Comment: 04/05/2014 "1/2 pint/weekend nite; total ~ 1 1/2 pint brandy"    Family History  Problem Relation Age of Onset  . Diabetes Other   . Hypertension Other     Review of Systems: The review of systems is per the HPI.  All other systems  were reviewed and are negative.  Physical Exam: There were no vitals taken for this visit. Patient is very pleasant and in no acute distress. Already back up 10 pounds since discharge. He remains obese. Skin is warm and dry. Color is normal.  HEENT is unremarkable. Normocephalic/atraumatic. PERRL. Sclera are nonicteric. Neck is supple. No masses. No JVD. Lungs are clear. Cardiac exam shows a regular rate and rhythm. Abdomen is soft. Extremities are without edema. Gait and ROM are intact. No gross neurologic deficits noted.  Wt Readings from Last 3 Encounters:  04/25/14 289 lb (131.09 kg)  04/18/14 284 lb 12.8 oz (129.184 kg)  04/08/14 274 lb 9.6 oz (124.558 kg)    LABORATORY DATA/PROCEDURES:  Lab Results  Component Value Date   WBC 10.3 04/07/2014   HGB 15.4 04/07/2014   HCT 46.0 04/07/2014   PLT 269 04/07/2014   GLUCOSE 104* 04/18/2014   CHOL 142 03/31/2013   TRIG 144.0 03/31/2013   HDL 25.80* 03/31/2013   LDLCALC 87 03/31/2013   ALT 21 04/05/2014   AST 32 04/05/2014   NA 139 04/18/2014   K 3.9 04/18/2014   CL 106 04/18/2014   CREATININE 1.8* 04/18/2014   BUN 24* 04/18/2014   CO2 27 04/18/2014   TSH 2.290 04/08/2014   INR 1.06 04/05/2014   HGBA1C 6.3* 07/26/2013    BNP (last 3 results)  Recent Labs  04/06/14 1420 04/07/14 0018 04/18/14 1203  PROBNP 1500.0* 1464.0* 72.0   Echo Study Conclusions from July 2015  - Left ventricle: The cavity size was severely dilated. There was mild focal basal hypertrophy of the septum. Systolic function was severely reduced. The estimated ejection fraction was 15%. Diffuse hypokinesis. There is akinesis of the basal-midinferior and inferoseptal myocardium. Doppler parameters are consistent with restrictive physiology, indicative of decreased left ventricular diastolic compliance and/or increased left atrial pressure. - Mitral valve: Mildly thickened leaflets . There was trivial regurgitation. - Left atrium: The atrium was severely dilated. - Right  ventricle: Systolic function was moderately reduced. - Right atrium: The atrium was mildly dilated. Central venous pressure (est): 3 mm Hg. - Atrial septum: No defect or patent foramen ovale was identified. - Tricuspid valve: There was trivial regurgitation. - Pulmonary arteries: Systolic pressure could not be accurately estimated. - Pericardium, extracardiac: There was no pericardial effusion.  Impressions:  - Mild septal hypertrophy with severe LV chamber dilatation and LVEF approximately 15%. Diffuse hypokinesis with mid to basal inferoseptal akinesis. Restrictive diastolic filling pattern. Severe left atrial enlargement. Moderately reduced RV contraction. Unable to assess  PASP.  Lab Results  Component Value Date   CKTOTAL 177 11/10/2011   CKMB 1.7 10/23/2011   TROPONINI 0.38* 04/06/2014   Myoview Findings from 2013: Normal myocardial perfusion on the stress images. There  is no evidence for a fixed or reversible defect. The wall motion  demonstrates diffuse hypokinesia. The end diastolic volume is 740  ml and end-systolic volume is 814 ml. The calculated ejection  fraction is 36%.  IMPRESSION:  No evidence for pharmacologically induced ischemia.  Diffuse hypokinesia with ejection fraction of 36%.  Original Report Authenticated By: Markus Daft, M.D.   Assessment / Plan: 1. Acute on chronic systolic heart failure - Volume is OK on exam  Didn't take meds yet today  Will not increase.  Check labs  .   2 CKD - needs labs rechecked today.   3 DM  4.. Tobacco abuse -  Counselled on cessation.    5. Prior nephrectomy for renal cell carcinoma - no follow up - reminded of the need to see urology at least yearly. Has had recent CXR with no obvious mets.   6.  L elbow  Warm Tender   Probable gout.  Check Uric acid  INcrease colchicine to Bid

## 2014-07-05 ENCOUNTER — Encounter: Payer: Self-pay | Admitting: Internal Medicine

## 2014-07-05 ENCOUNTER — Ambulatory Visit (INDEPENDENT_AMBULATORY_CARE_PROVIDER_SITE_OTHER): Payer: Medicaid Other | Admitting: Internal Medicine

## 2014-07-05 VITALS — BP 146/122 | HR 93 | Ht 67.0 in | Wt 286.0 lb

## 2014-07-05 DIAGNOSIS — I10 Essential (primary) hypertension: Secondary | ICD-10-CM

## 2014-07-05 DIAGNOSIS — I5022 Chronic systolic (congestive) heart failure: Secondary | ICD-10-CM

## 2014-07-05 LAB — CBC
HEMATOCRIT: 45.5 % (ref 39.0–52.0)
HEMOGLOBIN: 14.9 g/dL (ref 13.0–17.0)
MCHC: 32.8 g/dL (ref 30.0–36.0)
MCV: 81.6 fl (ref 78.0–100.0)
PLATELETS: 308 10*3/uL (ref 150.0–400.0)
RBC: 5.57 Mil/uL (ref 4.22–5.81)
RDW: 16 % — AB (ref 11.5–15.5)
WBC: 12.2 10*3/uL — ABNORMAL HIGH (ref 4.0–10.5)

## 2014-07-05 LAB — BASIC METABOLIC PANEL
BUN: 23 mg/dL (ref 6–23)
CALCIUM: 9.1 mg/dL (ref 8.4–10.5)
CO2: 24 mEq/L (ref 19–32)
CREATININE: 2 mg/dL — AB (ref 0.4–1.5)
Chloride: 106 mEq/L (ref 96–112)
GFR: 48.19 mL/min — ABNORMAL LOW (ref >60.00)
Glucose, Bld: 171 mg/dL — ABNORMAL HIGH (ref 70–99)
Potassium: 3.9 mEq/L (ref 3.5–5.1)
Sodium: 137 mEq/L (ref 135–145)

## 2014-07-05 LAB — BRAIN NATRIURETIC PEPTIDE: Pro B Natriuretic peptide (BNP): 163 pg/mL — ABNORMAL HIGH (ref 0.0–100.0)

## 2014-07-05 LAB — URIC ACID: URIC ACID, SERUM: 12.4 mg/dL — AB (ref 4.0–7.8)

## 2014-07-05 MED ORDER — COLCHICINE 0.6 MG PO TABS: 0.6000 mg | ORAL_TABLET | Freq: Two times a day (BID) | ORAL | Status: DC | PRN

## 2014-07-05 NOTE — Patient Instructions (Signed)
Your physician recommends that you return for lab work in:  TODAY (BMET, CBC, URIC ACID, BNP)  Your physician has recommended you make the following change in your medication:  1.) INCREASE COLCHICINE TO TWICE DAILY  Your physician wants you to follow-up in: EARLY January.   You will receive a reminder letter in the mail two months in advance. If you don't receive a letter, please call our office to schedule the follow-up appointment.

## 2014-07-09 ENCOUNTER — Telehealth: Payer: Self-pay | Admitting: Internal Medicine

## 2014-07-09 ENCOUNTER — Other Ambulatory Visit: Payer: Self-pay | Admitting: *Deleted

## 2014-07-09 DIAGNOSIS — N289 Disorder of kidney and ureter, unspecified: Secondary | ICD-10-CM

## 2014-07-09 NOTE — Telephone Encounter (Signed)
Referral to renal today. Informed patient I did not call him and it was likely the [facility] letting him know a referral has been made to kidney doctor. Pt verbalizes understanding. Advised he will hear from their office directly.

## 2014-07-09 NOTE — Telephone Encounter (Signed)
New message ° ° ° ° ° ° °Pt returning nurse call  °

## 2014-07-27 ENCOUNTER — Other Ambulatory Visit: Payer: Self-pay

## 2014-10-15 ENCOUNTER — Encounter: Payer: Self-pay | Admitting: Internal Medicine

## 2014-10-15 ENCOUNTER — Ambulatory Visit (INDEPENDENT_AMBULATORY_CARE_PROVIDER_SITE_OTHER): Payer: Self-pay | Admitting: Internal Medicine

## 2014-10-15 VITALS — BP 130/103 | HR 81 | Ht 67.0 in | Wt 294.0 lb

## 2014-10-15 DIAGNOSIS — I5022 Chronic systolic (congestive) heart failure: Secondary | ICD-10-CM

## 2014-10-15 DIAGNOSIS — I10 Essential (primary) hypertension: Secondary | ICD-10-CM

## 2014-10-15 MED ORDER — CARVEDILOL 12.5 MG PO TABS: 12.5000 mg | ORAL_TABLET | Freq: Two times a day (BID) | ORAL | Status: DC

## 2014-10-15 NOTE — Patient Instructions (Signed)
Your physician wants you to follow-up in:  April 2016 You will receive a reminder letter in the mail two months in advance. If you don't receive a letter, please call our office to schedule the follow-up appointment.  Your physician has recommended you make the following change in your medication:  Increase Coreg to 12. 5 mg by mouth twice daily.   You have an appointment this Friday--October 19, 2014 with Dr.Deterding at [facility].  They are located at [address]. Phone number is [phone]

## 2014-10-15 NOTE — Progress Notes (Signed)
Patinet is a 39 yo with history of presumed NICM, noncompliance, renal cell CA (s/p nephrectomy), DM, gout, HTN, sleep apnea and tobacco use.   Echo:  LVEF 15%  Myoview:  Scar  No ischemia  I saw him in clinic earlier this winter  BP was up but he had not taken meds at that visit He denies CP  Breathing is good     Allergies  Allergen Reactions  . Aspirin Shortness Of Breath and Rash    Rash, itching, burning sensation  (Patient reports he tolerates other NSAIDS)   . Bee Venom Hives and Swelling  . Tomato Rash    Current Outpatient Prescriptions  Medication Sig Dispense Refill  . acetaminophen (TYLENOL) 500 MG tablet Take 1,000 mg by mouth every 6 (six) hours as needed for mild pain.    [name] atorvastatin (LIPITOR) 80 MG tablet Take 1 tablet (80 mg total) by mouth every evening. 30 tablet 3  . carvedilol (COREG) 12.5 MG tablet Take 1 tablet (12.5 mg total) by mouth 2 (two) times daily. 60 tablet 9  . colchicine 0.6 MG tablet Take 1 tablet (0.6 mg total) by mouth 2 (two) times daily as needed (for gout). 60 tablet 3  . EPINEPHrine (EPIPEN) 0.3 mg/0.3 mL SOAJ injection Inject 0.3 mLs (0.3 mg total) into the muscle as needed (as needed for severe allergy). 1 Device 0  . insulin NPH-regular (NOVOLIN 70/30) (70-30) 100 UNIT/ML injection Inject 34 Units into the skin 2 (two) times daily with a meal. 10 mL 12  . lisinopril (PRINIVIL,ZESTRIL) 20 MG tablet Take 1 tablet (20 mg total) by mouth daily. 30 tablet 3  . torsemide (DEMADEX) 20 MG tablet Take 1 tablet (20 mg total) by mouth 2 (two) times daily. Start the evening of 04/09/14. 60 tablet 3  . [DISCONTINUED] cetirizine (ZYRTEC ALLERGY) 10 MG tablet Take 1 tablet (10 mg total) by mouth daily. 30 tablet 1   No current facility-administered medications for this visit.    Past Medical History  Diagnosis Date  . Hypertension   . Morbid obesity   . History of gout   . Chronic systolic CHF (congestive heart failure)     a. EF 20-25% in  2012. b. EF 45-50% in 10/2011 with nonischemic nuc - presumed NICM. c. 04/2014: severely dilated LV, EF 15%, akinesis of the basal-mid inferior and inferoseptal myocardium, restrictive physiology, mild focal basal hypertrophy of the septum, trivial MR, severely dilated LA, moderately reduced RV systolic fcn.  . Kidney stones   . OSA on CPAP   . High cholesterol   . Childhood asthma   . Type II diabetes mellitus   . Anxiety   . Renal cell carcinoma     a. s/p Rt robotic assisted partial converted to radical nephrectomy on 01/2013.  . Troponin level elevated     a. 04/2014: felt due to CHF.  . CKD (chronic kidney disease) stage 2, GFR 60-89 ml/min   . Aspirin allergy     Past Surgical History  Procedure Laterality Date  . Robotic assited partial nephrectomy Right 01/13/2013    Procedure: ROBOTIC ASSITED PARTIAL NEPHRECTOMY CONVERTED TO ROBOTIC ASSISTED RIGHT RADICAL NEPHRECTOMY;  Surgeon: Alexis Frock, MD;  Location: [facility];  Service: Urology;  Laterality: Right;  . Robotic assisted laparoscopic lysis of adhesion  01/13/2013    Procedure: ROBOTIC ASSISTED LAPAROSCOPIC LYSIS OF ADHESION EXTENSIVE;  Surgeon: Alexis Frock, MD;  Location: [facility];  Service: Urology;;  . Appendectomy  07/2004  Family History  Problem Relation Age of Onset  . Diabetes Other   . Hypertension Other   . Heart attack Neg Hx   . Stroke Neg Hx   . Hypertension Mother   . Diabetes Maternal Aunt     History   Social History  . Marital Status: Single    Spouse Name: N/A    Number of Children: N/A  . Years of Education: N/A   Occupational History  . Not on file.   Social History Main Topics  . Smoking status: Former Smoker -- 0.25 packs/day for 22 years    Types: Cigarettes    Quit date: 03/27/2014  . Smokeless tobacco: Never Used  . Alcohol Use: 9.6 oz/week    16 Shots of liquor per week     Comment: 04/05/2014 "1/2 pint/weekend nite; total ~ 1 1/2 pint brandy"  . Drug Use: Yes    Special: Marijuana      Comment: 04/05/2014 "last marijuana was 04/01/2014"  . Sexual Activity: Yes   Other Topics Concern  . Not on file   Social History Narrative    Review of Systems:  All systems reviewed.  They are negative to the above problem except as previously stated.  Vital Signs: BP 130/103 mmHg  Pulse 81  Ht 5\' 7"  (1.702 m)  Wt 294 lb (133.358 kg)  BMI 46.04 kg/m2  SpO2 97%  Physical Exam Patinet is a morbidly obese 39 yo in NAD   HEENT:  Normocephalic, atraumatic. EOMI, PERRLA.  Neck: JVP is normal.  No bruits.  Lungs: clear to auscultation. No rales no wheezes.  Heart: Regular rate and rhythm. Normal S1, S2. No S3.   No significant murmurs. PMI not displaced.  Abdomen:  Supple, nontender. Normal bowel sounds. No masses. No hepatomegaly.  Extremities:   Good distal pulses throughout. No lower extremity edema.  Musculoskeletal :moving all extremities.  Neuro:   alert and oriented x3.  CN II-XII grossly   Wts  Wt Readings from Last 3 Encounters:  10/15/14 294 lb (133.358 kg)  07/05/14 286 lb (129.729 kg)  04/25/14 289 lb (131.09 kg)    LABORATORY DATA/PROCEDURES:  Lab Results  Component Value Date   WBC 12.2* 07/05/2014   HGB 14.9 07/05/2014   HCT 45.5 07/05/2014   PLT 308.0 07/05/2014   GLUCOSE 171* 07/05/2014   CHOL 142 03/31/2013   TRIG 144.0 03/31/2013   HDL 25.80* 03/31/2013   LDLCALC 87 03/31/2013   ALT 21 04/05/2014   AST 32 04/05/2014   NA 137 07/05/2014   K 3.9 07/05/2014   CL 106 07/05/2014   CREATININE 2.0* 07/05/2014   BUN 23 07/05/2014   CO2 24 07/05/2014   TSH 2.290 04/08/2014   INR 1.06 04/05/2014   HGBA1C 6.3* 07/26/2013    BNP (last 3 results)  Recent Labs  04/07/14 0018 04/18/14 1203 07/05/14 0906  PROBNP 1464.0* 72.0 163.0*   Echo Study Conclusions from July 2015  - Left ventricle: The cavity size was severely dilated. There was mild focal basal hypertrophy of the septum. Systolic function was severely reduced. The estimated  ejection fraction was 15%. Diffuse hypokinesis. There is akinesis of the basal-midinferior and inferoseptal myocardium. Doppler parameters are consistent with restrictive physiology, indicative of decreased left ventricular diastolic compliance and/or increased left atrial pressure. - Mitral valve: Mildly thickened leaflets . There was trivial regurgitation. - Left atrium: The atrium was severely dilated. - Right ventricle: Systolic function was moderately reduced. - Right atrium: The atrium was  mildly dilated. Central venous pressure (est): 3 mm Hg. - Atrial septum: No defect or patent foramen ovale was identified. - Tricuspid valve: There was trivial regurgitation. - Pulmonary arteries: Systolic pressure could not be accurately estimated. - Pericardium, extracardiac: There was no pericardial effusion.  Impressions:  - Mild septal hypertrophy with severe LV chamber dilatation and LVEF approximately 15%. Diffuse hypokinesis with mid to basal inferoseptal akinesis. Restrictive diastolic filling pattern. Severe left atrial enlargement. Moderately reduced RV contraction. Unable to assess PASP.  Lab Results  Component Value Date   CKTOTAL 177 11/10/2011   CKMB 1.7 10/23/2011   TROPONINI 0.38* 04/06/2014   Myoview Findings from 2013: Normal myocardial perfusion on the stress images. There  is no evidence for a fixed or reversible defect. The wall motion  demonstrates diffuse hypokinesia. The end diastolic volume is 010  ml and end-systolic volume is 071 ml. The calculated ejection  fraction is 36%.  IMPRESSION:  No evidence for pharmacologically induced ischemia.  Diffuse hypokinesia with ejection fraction of 36%.  Original Report Authenticated By: Markus Daft, M.D.   Assessment / Plan: 1. Acute on chronic systolic heart failure - Volume is OK on exam  Would increase Coreg to 12.5 bid  F/U in april.   2 CKD - Patinet has appt with J Deterding Fri.    3 HTN  BP is up  Says he  was stressed earlier.  I would recomm increasing Coreg to 12.5 bid    4.. Tobacco abuse -  Counselled on cessation.    5. Prior nephrectomy for renal cell carcinoma - no follow up -No clear f/u  6.  Hx gout  No active complaints    F/U later this spring.

## 2014-10-23 ENCOUNTER — Other Ambulatory Visit: Payer: Self-pay | Admitting: Physician Assistant

## 2014-12-03 ENCOUNTER — Encounter: Payer: Self-pay | Admitting: Internal Medicine

## 2014-12-27 ENCOUNTER — Encounter (HOSPITAL_COMMUNITY): Payer: Self-pay

## 2014-12-27 ENCOUNTER — Inpatient Hospital Stay (HOSPITAL_COMMUNITY)
Admission: EM | Admit: 2014-12-27 | Discharge: 2014-12-30 | DRG: 292 | Disposition: A | Discharge status: Home Health | Payer: Self-pay | Attending: Internal Medicine | Admitting: Internal Medicine

## 2014-12-27 DIAGNOSIS — E1122 Type 2 diabetes mellitus with diabetic chronic kidney disease: Secondary | ICD-10-CM | POA: Diagnosis present

## 2014-12-27 DIAGNOSIS — Z8249 Family history of ischemic heart disease and other diseases of the circulatory system: Secondary | ICD-10-CM

## 2014-12-27 DIAGNOSIS — I429 Cardiomyopathy, unspecified: Secondary | ICD-10-CM | POA: Diagnosis present

## 2014-12-27 DIAGNOSIS — R0602 Shortness of breath: Secondary | ICD-10-CM

## 2014-12-27 DIAGNOSIS — Z888 Allergy status to other drugs, medicaments and biological substances status: Secondary | ICD-10-CM

## 2014-12-27 DIAGNOSIS — Z833 Family history of diabetes mellitus: Secondary | ICD-10-CM

## 2014-12-27 DIAGNOSIS — Z91030 Bee allergy status: Secondary | ICD-10-CM

## 2014-12-27 DIAGNOSIS — N183 Chronic kidney disease, stage 3 (moderate): Secondary | ICD-10-CM

## 2014-12-27 DIAGNOSIS — E1165 Type 2 diabetes mellitus with hyperglycemia: Secondary | ICD-10-CM | POA: Diagnosis present

## 2014-12-27 DIAGNOSIS — Z886 Allergy status to analgesic agent status: Secondary | ICD-10-CM

## 2014-12-27 DIAGNOSIS — I129 Hypertensive chronic kidney disease with stage 1 through stage 4 chronic kidney disease, or unspecified chronic kidney disease: Secondary | ICD-10-CM | POA: Diagnosis present

## 2014-12-27 DIAGNOSIS — Z79899 Other long term (current) drug therapy: Secondary | ICD-10-CM

## 2014-12-27 DIAGNOSIS — Z91018 Allergy to other foods: Secondary | ICD-10-CM

## 2014-12-27 DIAGNOSIS — R7989 Other specified abnormal findings of blood chemistry: Secondary | ICD-10-CM | POA: Diagnosis present

## 2014-12-27 DIAGNOSIS — Z794 Long term (current) use of insulin: Secondary | ICD-10-CM

## 2014-12-27 DIAGNOSIS — R0902 Hypoxemia: Secondary | ICD-10-CM

## 2014-12-27 DIAGNOSIS — Z791 Long term (current) use of non-steroidal anti-inflammatories (NSAID): Secondary | ICD-10-CM

## 2014-12-27 DIAGNOSIS — E785 Hyperlipidemia, unspecified: Secondary | ICD-10-CM | POA: Diagnosis present

## 2014-12-27 DIAGNOSIS — Z6841 Body Mass Index (BMI) 40.0 and over, adult: Secondary | ICD-10-CM

## 2014-12-27 DIAGNOSIS — R778 Other specified abnormalities of plasma proteins: Secondary | ICD-10-CM

## 2014-12-27 DIAGNOSIS — G4733 Obstructive sleep apnea (adult) (pediatric): Secondary | ICD-10-CM | POA: Diagnosis present

## 2014-12-27 DIAGNOSIS — R079 Chest pain, unspecified: Secondary | ICD-10-CM | POA: Diagnosis present

## 2014-12-27 DIAGNOSIS — Z9111 Patient's noncompliance with dietary regimen: Secondary | ICD-10-CM | POA: Diagnosis present

## 2014-12-27 DIAGNOSIS — E780 Pure hypercholesterolemia: Secondary | ICD-10-CM | POA: Diagnosis present

## 2014-12-27 DIAGNOSIS — Z905 Acquired absence of kidney: Secondary | ICD-10-CM | POA: Diagnosis present

## 2014-12-27 DIAGNOSIS — I10 Essential (primary) hypertension: Secondary | ICD-10-CM | POA: Diagnosis present

## 2014-12-27 DIAGNOSIS — I5023 Acute on chronic systolic (congestive) heart failure: Principal | ICD-10-CM | POA: Diagnosis present

## 2014-12-27 DIAGNOSIS — IMO0002 Reserved for concepts with insufficient information to code with codable children: Secondary | ICD-10-CM

## 2014-12-27 DIAGNOSIS — I5020 Unspecified systolic (congestive) heart failure: Secondary | ICD-10-CM

## 2014-12-27 DIAGNOSIS — I5033 Acute on chronic diastolic (congestive) heart failure: Secondary | ICD-10-CM

## 2014-12-27 DIAGNOSIS — Z87891 Personal history of nicotine dependence: Secondary | ICD-10-CM

## 2014-12-27 DIAGNOSIS — N189 Chronic kidney disease, unspecified: Secondary | ICD-10-CM

## 2014-12-27 DIAGNOSIS — M109 Gout, unspecified: Secondary | ICD-10-CM | POA: Diagnosis present

## 2014-12-27 DIAGNOSIS — Z87442 Personal history of urinary calculi: Secondary | ICD-10-CM

## 2014-12-27 DIAGNOSIS — R0789 Other chest pain: Secondary | ICD-10-CM

## 2014-12-27 DIAGNOSIS — F1210 Cannabis abuse, uncomplicated: Secondary | ICD-10-CM | POA: Diagnosis present

## 2014-12-27 DIAGNOSIS — N182 Chronic kidney disease, stage 2 (mild): Secondary | ICD-10-CM | POA: Diagnosis present

## 2014-12-27 DIAGNOSIS — Z85528 Personal history of other malignant neoplasm of kidney: Secondary | ICD-10-CM

## 2014-12-27 DIAGNOSIS — Z9119 Patient's noncompliance with other medical treatment and regimen: Secondary | ICD-10-CM | POA: Diagnosis present

## 2014-12-27 DIAGNOSIS — N179 Acute kidney failure, unspecified: Secondary | ICD-10-CM

## 2014-12-27 NOTE — ED Notes (Addendum)
Patient reports shortness of breath, cough, and chest tightness since yesterday.  Also complains of a "gout flare up."

## 2014-12-28 ENCOUNTER — Emergency Department (HOSPITAL_COMMUNITY): Payer: Self-pay

## 2014-12-28 ENCOUNTER — Encounter (HOSPITAL_COMMUNITY): Payer: Self-pay | Admitting: Emergency Medicine

## 2014-12-28 ENCOUNTER — Observation Stay (HOSPITAL_COMMUNITY): Payer: Self-pay

## 2014-12-28 DIAGNOSIS — I5023 Acute on chronic systolic (congestive) heart failure: Principal | ICD-10-CM

## 2014-12-28 DIAGNOSIS — R0789 Other chest pain: Secondary | ICD-10-CM

## 2014-12-28 DIAGNOSIS — G4733 Obstructive sleep apnea (adult) (pediatric): Secondary | ICD-10-CM

## 2014-12-28 DIAGNOSIS — I10 Essential (primary) hypertension: Secondary | ICD-10-CM

## 2014-12-28 DIAGNOSIS — M10071 Idiopathic gout, right ankle and foot: Secondary | ICD-10-CM

## 2014-12-28 DIAGNOSIS — E1165 Type 2 diabetes mellitus with hyperglycemia: Secondary | ICD-10-CM

## 2014-12-28 DIAGNOSIS — I5020 Unspecified systolic (congestive) heart failure: Secondary | ICD-10-CM

## 2014-12-28 DIAGNOSIS — I509 Heart failure, unspecified: Secondary | ICD-10-CM

## 2014-12-28 DIAGNOSIS — R079 Chest pain, unspecified: Secondary | ICD-10-CM | POA: Diagnosis present

## 2014-12-28 LAB — GLUCOSE, CAPILLARY
Glucose-Capillary: 161 mg/dL — ABNORMAL HIGH (ref 70–99)
Glucose-Capillary: 137 mg/dL — ABNORMAL HIGH (ref 70–99)
Glucose-Capillary: 143 mg/dL — ABNORMAL HIGH (ref 70–99)

## 2014-12-28 LAB — BASIC METABOLIC PANEL
ANION GAP: 7 (ref 5–15)
BUN: 20 mg/dL (ref 6–23)
CHLORIDE: 110 mmol/L (ref 96–112)
CO2: 21 mmol/L (ref 19–32)
CREATININE: 1.67 mg/dL — AB (ref 0.50–1.35)
Calcium: 9.2 mg/dL (ref 8.4–10.5)
GFR calc non Af Amer: 50 mL/min — ABNORMAL LOW (ref >90)
GFR, EST AFRICAN AMERICAN: 58 mL/min — AB (ref >90)
Glucose, Bld: 105 mg/dL — ABNORMAL HIGH (ref 70–99)
POTASSIUM: 4.1 mmol/L (ref 3.5–5.1)
Sodium: 138 mmol/L (ref 135–145)

## 2014-12-28 LAB — CBC
HCT: 37.7 % — ABNORMAL LOW (ref 39.0–52.0)
HEMOGLOBIN: 12.1 g/dL — AB (ref 13.0–17.0)
MCH: 23.2 pg — ABNORMAL LOW (ref 26.0–34.0)
MCHC: 32.1 g/dL (ref 30.0–36.0)
MCV: 72.2 fL — ABNORMAL LOW (ref 78.0–100.0)
Platelets: 380 10*3/uL (ref 150–400)
RBC: 5.22 MIL/uL (ref 4.22–5.81)
RDW: 16.6 % — ABNORMAL HIGH (ref 11.5–15.5)
WBC: 11.8 10*3/uL — ABNORMAL HIGH (ref 4.0–10.5)

## 2014-12-28 LAB — COMPREHENSIVE METABOLIC PANEL
ALK PHOS: 66 U/L (ref 39–117)
ALT: 35 U/L (ref 0–53)
AST: 30 U/L (ref 0–37)
Albumin: 3.5 g/dL (ref 3.5–5.2)
Anion gap: 9 (ref 5–15)
BUN: 19 mg/dL (ref 6–23)
CO2: 21 mmol/L (ref 19–32)
Calcium: 9 mg/dL (ref 8.4–10.5)
Chloride: 108 mmol/L (ref 96–112)
Creatinine, Ser: 1.6 mg/dL — ABNORMAL HIGH (ref 0.50–1.35)
GFR, EST AFRICAN AMERICAN: 61 mL/min — AB (ref >90)
GFR, EST NON AFRICAN AMERICAN: 52 mL/min — AB (ref >90)
Glucose, Bld: 125 mg/dL — ABNORMAL HIGH (ref 70–99)
POTASSIUM: 4 mmol/L (ref 3.5–5.1)
Sodium: 138 mmol/L (ref 135–145)
Total Bilirubin: 0.8 mg/dL (ref 0.3–1.2)
Total Protein: 7.1 g/dL (ref 6.0–8.3)

## 2014-12-28 LAB — PROTIME-INR
INR: 1.12 (ref 0.00–1.49)
Prothrombin Time: 14.6 seconds (ref 11.6–15.2)

## 2014-12-28 LAB — RAPID URINE DRUG SCREEN, HOSP PERFORMED
AMPHETAMINES: NOT DETECTED
Barbiturates: NOT DETECTED
Benzodiazepines: NOT DETECTED
Cocaine: NOT DETECTED
OPIATES: NOT DETECTED
Tetrahydrocannabinol: POSITIVE — AB

## 2014-12-28 LAB — TROPONIN I
TROPONIN I: 0.05 ng/mL — AB (ref <0.031)
Troponin I: 0.06 ng/mL — ABNORMAL HIGH (ref <0.031)
Troponin I: 0.04 ng/mL — ABNORMAL HIGH (ref <0.031)

## 2014-12-28 LAB — I-STAT TROPONIN, ED: TROPONIN I, POC: 0.04 ng/mL (ref 0.00–0.08)

## 2014-12-28 LAB — BRAIN NATRIURETIC PEPTIDE: B Natriuretic Peptide: 1066.1 pg/mL — ABNORMAL HIGH (ref 0.0–100.0)

## 2014-12-28 LAB — SEDIMENTATION RATE: Sed Rate: 11 mm/hr (ref 0–16)

## 2014-12-28 LAB — D-DIMER, QUANTITATIVE: D-Dimer, Quant: 1.47 ug/mL-FEU — ABNORMAL HIGH (ref 0.00–0.48)

## 2014-12-28 LAB — TSH: TSH: 2.671 u[IU]/mL (ref 0.350–4.500)

## 2014-12-28 LAB — C-REACTIVE PROTEIN: CRP: 1.2 mg/dL — AB (ref <0.60)

## 2014-12-28 LAB — URIC ACID: Uric Acid, Serum: 10.7 mg/dL — ABNORMAL HIGH (ref 4.0–7.8)

## 2014-12-28 LAB — MAGNESIUM: MAGNESIUM: 1.6 mg/dL (ref 1.5–2.5)

## 2014-12-28 LAB — MRSA PCR SCREENING: MRSA by PCR: NEGATIVE

## 2014-12-28 MED ORDER — INSULIN ASPART 100 UNIT/ML ~~LOC~~ SOLN
0.0000 [IU] | Freq: Three times a day (TID) | SUBCUTANEOUS | Status: DC
  Administered 2014-12-28: 2 [IU] via SUBCUTANEOUS
  Administered 2014-12-28: 3 [IU] via SUBCUTANEOUS
  Administered 2014-12-29 (×3): 2 [IU] via SUBCUTANEOUS

## 2014-12-28 MED ORDER — PERFLUTREN LIPID MICROSPHERE
1.0000 mL | INTRAVENOUS | Status: AC | PRN
  Administered 2014-12-28: 2 mL via INTRAVENOUS
  Filled 2014-12-28: qty 10

## 2014-12-28 MED ORDER — FUROSEMIDE 10 MG/ML IJ SOLN: 40.0000 mg | Freq: Once | INTRAMUSCULAR | Status: DC

## 2014-12-28 MED ORDER — OXYCODONE-ACETAMINOPHEN 5-325 MG PO TABS
1.0000 | ORAL_TABLET | ORAL | Status: DC | PRN
  Administered 2014-12-28 – 2014-12-30 (×4): 1 via ORAL
  Filled 2014-12-28 (×4): qty 1

## 2014-12-28 MED ORDER — COLCHICINE 0.6 MG PO TABS
0.6000 mg | ORAL_TABLET | Freq: Every day | ORAL | Status: DC
  Administered 2014-12-28 – 2014-12-30 (×3): 0.6 mg via ORAL
  Filled 2014-12-28 (×3): qty 1

## 2014-12-28 MED ORDER — LISINOPRIL 5 MG PO TABS
5.0000 mg | ORAL_TABLET | Freq: Every day | ORAL | Status: DC
  Administered 2014-12-28 – 2014-12-30 (×3): 5 mg via ORAL
  Filled 2014-12-28 (×3): qty 1

## 2014-12-28 MED ORDER — COLCHICINE 0.6 MG PO TABS
0.6000 mg | ORAL_TABLET | Freq: Two times a day (BID) | ORAL | Status: DC | PRN
  Filled 2014-12-28: qty 1

## 2014-12-28 MED ORDER — INSULIN ASPART 100 UNIT/ML ~~LOC~~ SOLN: 0.0000 [IU] | Freq: Every day | SUBCUTANEOUS | Status: DC

## 2014-12-28 MED ORDER — FUROSEMIDE 10 MG/ML IJ SOLN
40.0000 mg | Freq: Once | INTRAMUSCULAR | Status: AC
  Administered 2014-12-28: 40 mg via INTRAVENOUS
  Filled 2014-12-28: qty 4

## 2014-12-28 MED ORDER — COLCHICINE 0.6 MG PO TABS
0.6000 mg | ORAL_TABLET | Freq: Once | ORAL | Status: AC
  Administered 2014-12-28: 0.6 mg via ORAL
  Filled 2014-12-28: qty 1

## 2014-12-28 MED ORDER — FUROSEMIDE 10 MG/ML IJ SOLN
40.0000 mg | Freq: Every day | INTRAMUSCULAR | Status: DC
  Administered 2014-12-28: 40 mg via INTRAVENOUS
  Filled 2014-12-28: qty 4

## 2014-12-28 MED ORDER — IOHEXOL 300 MG/ML  SOLN
80.0000 mL | Freq: Once | INTRAMUSCULAR | Status: AC | PRN
  Administered 2014-12-28: 80 mL via INTRAVENOUS

## 2014-12-28 MED ORDER — HEPARIN SODIUM (PORCINE) 5000 UNIT/ML IJ SOLN
5000.0000 [IU] | Freq: Three times a day (TID) | INTRAMUSCULAR | Status: DC
  Administered 2014-12-28 – 2014-12-30 (×7): 5000 [IU] via SUBCUTANEOUS
  Filled 2014-12-28 (×10): qty 1

## 2014-12-28 MED ORDER — MORPHINE SULFATE 2 MG/ML IJ SOLN
2.0000 mg | INTRAMUSCULAR | Status: DC | PRN
  Administered 2014-12-28 (×2): 2 mg via INTRAVENOUS
  Filled 2014-12-28 (×2): qty 1

## 2014-12-28 MED ORDER — INSULIN ASPART 100 UNIT/ML ~~LOC~~ SOLN: 0.0000 [IU] | Freq: Four times a day (QID) | SUBCUTANEOUS | Status: DC

## 2014-12-28 MED ORDER — NITROGLYCERIN 2 % TD OINT
1.0000 [in_us] | TOPICAL_OINTMENT | Freq: Once | TRANSDERMAL | Status: AC
  Administered 2014-12-28: 1 [in_us] via TOPICAL
  Filled 2014-12-28: qty 30

## 2014-12-28 MED ORDER — FENTANYL CITRATE 0.05 MG/ML IJ SOLN
100.0000 ug | Freq: Once | INTRAMUSCULAR | Status: AC
  Administered 2014-12-28: 100 ug via INTRAVENOUS
  Filled 2014-12-28: qty 2

## 2014-12-28 MED ORDER — PREDNISONE 50 MG PO TABS
50.0000 mg | ORAL_TABLET | Freq: Every day | ORAL | Status: DC
Start: 2014-12-28 — End: 2014-12-30
  Administered 2014-12-28 – 2014-12-30 (×3): 50 mg via ORAL
  Filled 2014-12-28: qty 2
  Filled 2014-12-28 (×2): qty 1
  Filled 2014-12-28: qty 2
  Filled 2014-12-28: qty 1

## 2014-12-28 MED ORDER — PREDNISONE 20 MG PO TABS
60.0000 mg | ORAL_TABLET | Freq: Once | ORAL | Status: AC
  Administered 2014-12-28: 60 mg via ORAL
  Filled 2014-12-28: qty 3

## 2014-12-28 MED ORDER — CARVEDILOL 12.5 MG PO TABS
12.5000 mg | ORAL_TABLET | Freq: Two times a day (BID) | ORAL | Status: DC
  Administered 2014-12-28 – 2014-12-29 (×3): 12.5 mg via ORAL
  Filled 2014-12-28 (×3): qty 1

## 2014-12-28 NOTE — Progress Notes (Signed)
Hand off report given to Maudie Mercury, Therapist, sports.  Pt is transferring to room 1404 via wheelchair. Significant other at the bedside.

## 2014-12-28 NOTE — ED Notes (Signed)
Dr. Palumbo at bedside. 

## 2014-12-28 NOTE — ED Notes (Signed)
Patient SPO2 began dropping. Patient placed on [facility]. Patient states he has OSA and uses CPAP at home.

## 2014-12-28 NOTE — ED Notes (Signed)
Attempted to call report, no answer. Will try again.

## 2014-12-28 NOTE — Progress Notes (Signed)
[facility]                                                                              Patient Demographics  Jeffrey [name], is a 39 y.o. male, DOB - 1976-04-27, WRU:[id number]  Admit date - 12/27/2014   Admitting Physician Lavina Hamman, MD  Outpatient Primary MD for the patient is Angelica Chessman, MD  LOS -    Chief Complaint  Patient presents with  . Shortness of Breath       Brief HPI   Jeffrey [name] is a 39 y.o. male with Past medical history of chronic systolic CHF with EF of 91%, morbid obesity, obstructive sleep apnea, hyperlipidemia, diabetes mellitus, chronic kidney disease, renal cell carcinoma, hypertension. The patient presented with complaints of chest pain and shortness of breath, ongoing for last 2 days prior to admission, progressively worsening with orthopnea and PND. Patient noted that chest pain started on the day of admission, substernal, worse with breathing, stabbing. He also reports right foot pain secondary to acute gout attack.    Assessment & Plan    Principal Problem:  Atypical Chest pain with mildly elevated troponin : Risk factors CHF with cardiomyopathy EF 29%, had a Myoview in 04/2014, troponin elevated, EKG with no ST-T wave changes suggestive of acute ischemia. - Continue serial cardiac enzymes, d-dimer, cardiology consulted - Per cardiology, Recent Myoview in 7/15 with moderate decreased uptake affecting the mid/apical inferior segments, apical and apical clear segment with no reversibility. EF was 29%. - No plans for cardiac cath at this time, consideration for AICD if EF remains low. Follow up outpatient with cardiology   Active Problems: Acute gout attack, Right foot - Obtain ESR, CRP, uric acid, placed on colchicine, allopurinol, prednisone     Diabetes mellitus type 2, uncontrolled - Obtain hemoglobin A1c, placed on sliding scale insulin    Morbid obesity - Patient counseled on diet and weight control and medication  compliance.     OSA (obstructive sleep apnea) - Placed on CPAP qhs    Acute on chronic systolic CHF (congestive heart failure) - Noncompliant with diet, continue IV Lasix, strict I's and O's, follow up 2-D echocardiogram - Continue Coreg, lisinopril, Lasix    Accelerated hypertension - BP somewhat still uncontrolled , restarted antihypertensives, will adjust doses as needed  Substance abuse Urine drug screen positive for THC, counseled patient  Code Status: Full code   Family Communication: Discussed in detail with the patient, all imaging results, lab results explained to the patient   Disposition Plan: Hopefully dc 24-48 hours   Time Spent in minutes   30 mins  Procedures  none  Consults   cardiology  DVT Prophylaxis   heparin   Medications  Scheduled Meds: . carvedilol  12.5 mg Oral BID  . colchicine  0.6 mg Oral Daily  . furosemide  40 mg Intravenous Daily  . heparin subcutaneous  5,000 Units Subcutaneous 3 times per day  . insulin aspart  0-15 Units Subcutaneous Q6H  . lisinopril  5 mg Oral Daily  . predniSONE  50 mg Oral Q breakfast   Continuous Infusions:  PRN Meds:.morphine injection   Antibiotics  Anti-infectives    None        Subjective:   Jeffrey [name] was seen and examined today.  Patient reports that chest pain is improving, currently 4/10, denies any nausea, vomiting. Shortness of breath is also improving.     Objective:   Blood pressure 145/109, pulse 82, temperature 97.6 F (36.4 C), temperature source Oral, resp. rate 33, height 5' 7.5" (1.715 m), weight 134.9 kg (297 lb 6.4 oz), SpO2 96 %.  Wt Readings from Last 3 Encounters:  12/28/14 134.9 kg (297 lb 6.4 oz)  10/15/14 133.358 kg (294 lb)  07/05/14 129.729 kg (286 lb)     Intake/Output Summary (Last 24 hours) at 12/28/14 0938 Last data filed at 12/28/14 0800  Gross per 24 hour  Intake    240 ml  Output   2700 ml  Net  -2460 ml    Exam  General: Alert and oriented x  3, NAD  HEENT:  PERRLA, EOMI, Anicteic Sclera, mucous membranes moist.   Neck: Supple, no JVD, no masses  CVS: S1 S2 auscultated, no rubs, murmurs or gallops. Regular rate and rhythm.  Respiratory: Bibasilar crackles  Abdomen: Soft, nontender, nondistended, + bowel sounds  Ext: no cyanosis clubbing, 2+ edema, right great toe/foot erythematous and warm and tender   Neuro: AAOx3, Cr N's II- XII. Strength 5/5 upper and lower extremities bilaterally  Skin: No rashes  Psych: Normal affect and demeanor, alert and oriented x3    Data Review   Micro Results Recent Results (from the past 240 hour(s))  MRSA PCR Screening     Status: None   Collection Time: 12/28/14  5:39 AM  Result Value Ref Range Status   MRSA by PCR NEGATIVE NEGATIVE Final    Comment:        The GeneXpert MRSA Assay (FDA approved for NASAL specimens only), is one component of a comprehensive MRSA colonization surveillance program. It is not intended to diagnose MRSA infection nor to guide or monitor treatment for MRSA infections.     Radiology Reports Dg Chest 2 View  12/28/2014   CLINICAL DATA:  Acute onset of shortness of breath, cough and chest tightness. Initial encounter.  EXAM: CHEST  2 VIEW  COMPARISON:  Chest radiograph performed 04/05/2014  FINDINGS: The lungs are well-aerated. Vascular congestion is noted, with bilateral central airspace opacities, likely reflecting mild recurrent pulmonary edema. No pleural effusion or pneumothorax is seen.  The heart is borderline enlarged. No acute osseous abnormalities are seen.  IMPRESSION: Vascular congestion and borderline cardiomegaly, with bilateral central airspace opacities, likely reflecting mild recurrent pulmonary edema.   Electronically Signed   By: Garald Balding M.D.   On: 12/28/2014 01:01    CBC  Recent Labs Lab 12/28/14 0023  WBC 11.8*  HGB 12.1*  HCT 37.7*  PLT 380  MCV 72.2*  MCH 23.2*  MCHC 32.1  RDW 16.6*    Chemistries   Recent  Labs Lab 12/28/14 0023 12/28/14 0428  NA 138 138  K 4.1 4.0  CL 110 108  CO2 21 21  GLUCOSE 105* 125*  BUN 20 19  CREATININE 1.67* 1.60*  CALCIUM 9.2 9.0  MG  --  1.6  AST  --  30  ALT  --  35  ALKPHOS  --  66  BILITOT  --  0.8   ------------------------------------------------------------------------------------------------------------------ estimated creatinine clearance is 81.9 mL/min (by C-G formula based on Cr of 1.6). ------------------------------------------------------------------------------------------------------------------ No results for input(s): HGBA1C in the last 72 hours. ------------------------------------------------------------------------------------------------------------------  No results for input(s): CHOL, HDL, LDLCALC, TRIG, CHOLHDL, LDLDIRECT in the last 72 hours. ------------------------------------------------------------------------------------------------------------------  Recent Labs  12/28/14 0428  TSH 2.671   ------------------------------------------------------------------------------------------------------------------ No results for input(s): VITAMINB12, FOLATE, FERRITIN, TIBC, IRON, RETICCTPCT in the last 72 hours.  Coagulation profile  Recent Labs Lab 12/28/14 0428  INR 1.12    No results for input(s): DDIMER in the last 72 hours.  Cardiac Enzymes  Recent Labs Lab 12/28/14 0428  TROPONINI 0.06*   ------------------------------------------------------------------------------------------------------------------ Invalid input(s): POCBNP  No results for input(s): GLUCAP in the last 72 hours.   Rodriquez Thorner M.D. [facility] 12/28/2014, 9:38 AM  Pager: [phone]   Between 7am to 7pm - call Pager - [phone]  After 7pm go to [email or web address] - password [email or web address]  Call night coverage person covering after 7pm

## 2014-12-28 NOTE — Progress Notes (Signed)
  Echocardiogram 2D Echocardiogram has been performed.  Jeffrey [name] 12/28/2014, 12:39 PM

## 2014-12-28 NOTE — Progress Notes (Signed)
UR completed 

## 2014-12-28 NOTE — H&P (Signed)
[facility] History and Physical  Patient: Jeffrey [name]  MRN: [id number]  DOB: [birth year]  DOS: the patient was seen and examined on 12/28/2014 PCP: Angelica Chessman, MD  Chief Complaint:   HPI: Jeffrey [name] is a 39 y.o. male with Past medical history of chronic systolic CHF with EF of 09%, morbid obesity, obstructive sleep apnea, his lipidemia, diabetes mellitus, chronic kidney disease, renal cell carcinoma, hypertension. The patient is presenting with complaints of chest pain and shortness of breath. The symptoms have been ongoing for last 2 days. The shortness of breath has been progressively worsening and he has symptoms of orthopnea as well as PND. He also has complains of chest pain which started today which is located centrally across the chest and will worsen with breathing. The pain feels like stabbing. He denies any fever or chills. Denies any sick contact or runny nose. Denies any diarrhea constipation burning urination. Denies any significant leg swelling. He hasn't checked his weight recently. He mentions he has been, compliant with all his medications but does not remember their names. He mentions once a week at least he has been eating a diet which is high in salt. He drinks a gallon of water on a daily basis.  The patient is coming from home. And at his baseline independent for most of his ADL.  Review of Systems: as mentioned in the history of present illness.  A Comprehensive review of the other systems is negative.  Past Medical History  Diagnosis Date  . Hypertension   . Morbid obesity   . History of gout   . Chronic systolic CHF (congestive heart failure)     a. EF 20-25% in 2012. b. EF 45-50% in 10/2011 with nonischemic nuc - presumed NICM. c. 04/2014: severely dilated LV, EF 15%, akinesis of the basal-mid inferior and inferoseptal myocardium, restrictive physiology, mild focal basal hypertrophy of the septum, trivial MR, severely dilated LA, moderately  reduced RV systolic fcn.  . Kidney stones   . OSA on CPAP   . High cholesterol   . Childhood asthma   . Type II diabetes mellitus   . Anxiety   . Renal cell carcinoma     a. s/p Rt robotic assisted partial converted to radical nephrectomy on 01/2013.  . Troponin level elevated     a. 04/2014: felt due to CHF.  . CKD (chronic kidney disease) stage 2, GFR 60-89 ml/min   . Aspirin allergy    Past Surgical History  Procedure Laterality Date  . Robotic assited partial nephrectomy Right 01/13/2013    Procedure: ROBOTIC ASSITED PARTIAL NEPHRECTOMY CONVERTED TO ROBOTIC ASSISTED RIGHT RADICAL NEPHRECTOMY;  Surgeon: Alexis Frock, MD;  Location: [facility];  Service: Urology;  Laterality: Right;  . Robotic assisted laparoscopic lysis of adhesion  01/13/2013    Procedure: ROBOTIC ASSISTED LAPAROSCOPIC LYSIS OF ADHESION EXTENSIVE;  Surgeon: Alexis Frock, MD;  Location: [facility];  Service: Urology;;  . Appendectomy  07/2004   Social History:  reports that he quit smoking about 9 months ago. His smoking use included Cigarettes. He has a 5.5 pack-year smoking history. He has never used smokeless tobacco. He reports that he drinks about 9.6 oz of alcohol per week. He reports that he uses illicit drugs (Marijuana).  Allergies  Allergen Reactions  . Aspirin Shortness Of Breath and Rash    Rash, itching, burning sensation  (Patient reports he tolerates other NSAIDS)   . Bee Venom Hives and Swelling  . Tomato Rash  Family History  Problem Relation Age of Onset  . Diabetes Other   . Hypertension Other   . Heart attack Neg Hx   . Stroke Neg Hx   . Hypertension Mother   . Diabetes Maternal Aunt     Prior to Admission medications   Medication Sig Start Date End Date Taking? Authorizing Provider  acetaminophen (TYLENOL) 500 MG tablet Take 1,000 mg by mouth every 6 (six) hours as needed for mild pain.   Yes Historical Provider, MD  atorvastatin (LIPITOR) 80 MG tablet TAKE 1 TABLET (80 MG TOTAL) BY  MOUTH EVERY EVENING. 10/25/14  Yes Fay Records, MD  carvedilol (COREG) 12.5 MG tablet Take 1 tablet (12.5 mg total) by mouth 2 (two) times daily. 10/15/14  Yes Fay Records, MD  colchicine 0.6 MG tablet Take 1 tablet (0.6 mg total) by mouth 2 (two) times daily as needed (for gout). 07/05/14  Yes Fay Records, MD  EPINEPHrine (EPIPEN) 0.3 mg/0.3 mL SOAJ injection Inject 0.3 mLs (0.3 mg total) into the muscle as needed (as needed for severe allergy). 06/18/13  Yes Ezequiel Essex, MD  ibuprofen (ADVIL,MOTRIN) 200 MG tablet Take 800 mg by mouth every 6 (six) hours as needed for moderate pain.   Yes Historical Provider, MD  insulin NPH-regular (NOVOLIN 70/30) (70-30) 100 UNIT/ML injection Inject 34 Units into the skin 2 (two) times daily with a meal. 08/19/13  Yes Olugbemiga E Jegede, MD  lisinopril (PRINIVIL,ZESTRIL) 20 MG tablet TAKE 1 TABLET (20 MG TOTAL) BY MOUTH DAILY. 10/25/14  Yes Fay Records, MD  OVER THE COUNTER MEDICATION Take 1 tablet by mouth 3 (three) times daily.   Yes Historical Provider, MD  torsemide (DEMADEX) 20 MG tablet Take 1 tablet (20 mg total) by mouth 2 (two) times daily. Start the evening of 04/09/14. 04/09/14  Yes Charlie Pitter, PA-C    Physical Exam: Filed Vitals:   12/28/14 0400 12/28/14 0500 12/28/14 0515 12/28/14 0530  BP: 176/118  160/104 152/111  Pulse: 83  86   Temp:   98 F (36.7 C)   TempSrc:   Oral   Resp: 31  27   Height:      Weight:  134.9 kg (297 lb 6.4 oz)    SpO2: 93%  97%     General: Alert, Awake and Oriented to Time, Place and Person. Appear in mild distress Eyes: PERRL ENT: Oral Mucosa clear moist. Neck: no JVD Cardiovascular: S1 and S2 Present, no Murmur, Peripheral Pulses Present Respiratory: Bilateral Air entry equal and Decreased, Clear to Auscultation, noCrackles, no wheezes Abdomen: Bowel Sound present, Soft and non tender Skin: no Rash Extremities: Trace Pedal edema, non calf tenderness Neurologic: Grossly no focal neuro deficit.  Labs on  Admission:  CBC:  Recent Labs Lab 12/28/14 0023  WBC 11.8*  HGB 12.1*  HCT 37.7*  MCV 72.2*  PLT 380    CMP     Component Value Date/Time   NA 138 12/28/2014 0428   K 4.0 12/28/2014 0428   CL 108 12/28/2014 0428   CO2 21 12/28/2014 0428   GLUCOSE 125* 12/28/2014 0428   BUN 19 12/28/2014 0428   CREATININE 1.60* 12/28/2014 0428   CALCIUM 9.0 12/28/2014 0428   PROT 7.1 12/28/2014 0428   ALBUMIN 3.5 12/28/2014 0428   AST 30 12/28/2014 0428   ALT 35 12/28/2014 0428   ALKPHOS 66 12/28/2014 0428   BILITOT 0.8 12/28/2014 0428   GFRNONAA 52* 12/28/2014 0428   GFRAA 61*  12/28/2014 0428    No results for input(s): LIPASE, AMYLASE in the last 168 hours.   Recent Labs Lab 12/28/14 0428  TROPONINI 0.06*   BNP (last 3 results)  Recent Labs  12/28/14 0023  BNP 1066.1*    ProBNP (last 3 results)  Recent Labs  04/07/14 0018 04/18/14 1203 07/05/14 0906  PROBNP 1464.0* 72.0 163.0*     Radiological Exams on Admission: Dg Chest 2 View  12/28/2014   CLINICAL DATA:  Acute onset of shortness of breath, cough and chest tightness. Initial encounter.  EXAM: CHEST  2 VIEW  COMPARISON:  Chest radiograph performed 04/05/2014  FINDINGS: The lungs are well-aerated. Vascular congestion is noted, with bilateral central airspace opacities, likely reflecting mild recurrent pulmonary edema. No pleural effusion or pneumothorax is seen.  The heart is borderline enlarged. No acute osseous abnormalities are seen.  IMPRESSION: Vascular congestion and borderline cardiomegaly, with bilateral central airspace opacities, likely reflecting mild recurrent pulmonary edema.   Electronically Signed   By: Garald Balding M.D.   On: 12/28/2014 01:01    EKG: Independently reviewed. normal sinus rhythm, nonspecific ST and T waves changes.  Assessment/Plan Principal Problem:   Chest pain Active Problems:   Diabetes mellitus type 2, uncontrolled   Morbid obesity   OSA (obstructive sleep apnea)   Gout  attack   Acute on chronic systolic CHF (congestive heart failure)   Accelerated hypertension   1. Chest pain The patient is presenting with complains of chest pain. Along with that he is also found to be having accelerated hypertension as well as a acute on chronic CHF. Most likely this is secondary to noncompliance although cannot be verified. Patient mentions he is compliant with all medications. Currently I will resume his medications and admit him to the step down unit due to ongoing chest pain. We will use that replacement ointment as well as morphine when necessary. Follow serial troponin. We'll discuss with cardiology in the morning. Patient will remain nothing by mouth except medication. Follow echocardiogram.  2. Acute on chronic CHF. Noncompliant with diet. Continue IV Lasix. Monitor ins and outs and daily weight. Echocardiogram.  3. Gout attack. Continue colchicine and the prednisone.  4. Chronic kidney disease. Appears stable. Continue close monitoring.  5. Obstructive sleep apnea. Continue C Pap  6. Diabetes mellitus. Check hemoglobin A1c and placing him on sliding scale.  Advance goals of care discussion: Full code   Consults: Cardiology  DVT Prophylaxis: subcutaneous Heparin. Nutrition: Nothing by mouth except medications  Disposition: Admitted to observation in step-down unit.  Author: Berle Mull, MD [facility] Pager: [phone] 12/28/2014, 5:49 AM    If 7PM-7AM, please contact night-coverage [email or web address] Password [email or web address]

## 2014-12-28 NOTE — ED Notes (Signed)
Patient placed on 5 lead telemetry monitor. Urinal placed at bedside.

## 2014-12-28 NOTE — ED Notes (Signed)
Patient transported to X-ray 

## 2014-12-28 NOTE — ED Provider Notes (Signed)
CSN: [id number]     Arrival date & time 12/27/14  2305 History   First MD Initiated Contact with Patient 12/28/14 0037     Chief Complaint  Patient presents with  . Shortness of Breath     (Consider location/radiation/quality/duration/timing/severity/associated sxs/prior Treatment) Patient is a 39 y.o. male presenting with shortness of breath. The history is provided by the patient and the spouse.  Shortness of Breath Severity:  Moderate Onset quality:  Gradual Duration:  1 day Timing:  Constant Progression:  Unchanged Chronicity:  Recurrent Context: not smoke exposure   Relieved by:  Nothing Worsened by:  Nothing tried Ineffective treatments:  None tried Associated symptoms: cough   Risk factors: obesity   Risk factors: no recent surgery     Past Medical History  Diagnosis Date  . Hypertension   . Morbid obesity   . History of gout   . Chronic systolic CHF (congestive heart failure)     a. EF 20-25% in 2012. b. EF 45-50% in 10/2011 with nonischemic nuc - presumed NICM. c. 04/2014: severely dilated LV, EF 15%, akinesis of the basal-mid inferior and inferoseptal myocardium, restrictive physiology, mild focal basal hypertrophy of the septum, trivial MR, severely dilated LA, moderately reduced RV systolic fcn.  . Kidney stones   . OSA on CPAP   . High cholesterol   . Childhood asthma   . Type II diabetes mellitus   . Anxiety   . Renal cell carcinoma     a. s/p Rt robotic assisted partial converted to radical nephrectomy on 01/2013.  . Troponin level elevated     a. 04/2014: felt due to CHF.  . CKD (chronic kidney disease) stage 2, GFR 60-89 ml/min   . Aspirin allergy    Past Surgical History  Procedure Laterality Date  . Robotic assited partial nephrectomy Right 01/13/2013    Procedure: ROBOTIC ASSITED PARTIAL NEPHRECTOMY CONVERTED TO ROBOTIC ASSISTED RIGHT RADICAL NEPHRECTOMY;  Surgeon: Alexis Frock, MD;  Location: [facility];  Service: Urology;  Laterality: Right;  .  Robotic assisted laparoscopic lysis of adhesion  01/13/2013    Procedure: ROBOTIC ASSISTED LAPAROSCOPIC LYSIS OF ADHESION EXTENSIVE;  Surgeon: Alexis Frock, MD;  Location: [facility];  Service: Urology;;  . Appendectomy  07/2004   Family History  Problem Relation Age of Onset  . Diabetes Other   . Hypertension Other   . Heart attack Neg Hx   . Stroke Neg Hx   . Hypertension Mother   . Diabetes Maternal Aunt    History  Substance Use Topics  . Smoking status: Former Smoker -- 0.25 packs/day for 22 years    Types: Cigarettes    Quit date: 03/27/2014  . Smokeless tobacco: Never Used  . Alcohol Use: 9.6 oz/week    16 Shots of liquor per week     Comment: 04/05/2014 "1/2 pint/weekend nite; total ~ 1 1/2 pint brandy"    Review of Systems  Respiratory: Positive for cough and shortness of breath.   All other systems reviewed and are negative.     Allergies  Aspirin; Bee venom; and Tomato  Home Medications   Prior to Admission medications   Medication Sig Start Date End Date Taking? Authorizing Provider  acetaminophen (TYLENOL) 500 MG tablet Take 1,000 mg by mouth every 6 (six) hours as needed for mild pain.   Yes Historical Provider, MD  atorvastatin (LIPITOR) 80 MG tablet TAKE 1 TABLET (80 MG TOTAL) BY MOUTH EVERY EVENING. 10/25/14  Yes Fay Records, MD  carvedilol (COREG) 12.5 MG tablet Take 1 tablet (12.5 mg total) by mouth 2 (two) times daily. 10/15/14  Yes Fay Records, MD  colchicine 0.6 MG tablet Take 1 tablet (0.6 mg total) by mouth 2 (two) times daily as needed (for gout). 07/05/14  Yes Fay Records, MD  EPINEPHrine (EPIPEN) 0.3 mg/0.3 mL SOAJ injection Inject 0.3 mLs (0.3 mg total) into the muscle as needed (as needed for severe allergy). 06/18/13  Yes Ezequiel Essex, MD  ibuprofen (ADVIL,MOTRIN) 200 MG tablet Take 800 mg by mouth every 6 (six) hours as needed for moderate pain.   Yes Historical Provider, MD  insulin NPH-regular (NOVOLIN 70/30) (70-30) 100 UNIT/ML injection  Inject 34 Units into the skin 2 (two) times daily with a meal. 08/19/13  Yes Olugbemiga E Jegede, MD  lisinopril (PRINIVIL,ZESTRIL) 20 MG tablet TAKE 1 TABLET (20 MG TOTAL) BY MOUTH DAILY. 10/25/14  Yes Fay Records, MD  OVER THE COUNTER MEDICATION Take 1 tablet by mouth 3 (three) times daily.   Yes Historical Provider, MD  torsemide (DEMADEX) 20 MG tablet Take 1 tablet (20 mg total) by mouth 2 (two) times daily. Start the evening of 04/09/14. 04/09/14  Yes Dayna N Dunn, PA-C   BP 172/111 mmHg  Pulse 83  Temp(Src) 98.2 F (36.8 C) (Oral)  Resp 21  Ht 5' 7.5" (1.715 m)  Wt 290 lb (131.543 kg)  BMI 44.72 kg/m2  SpO2 97% Physical Exam  Constitutional: He is oriented to person, place, and time. He appears well-developed and well-nourished. No distress.  HENT:  Head: Normocephalic and atraumatic.  Mouth/Throat: Oropharynx is clear and moist.  Eyes: Conjunctivae are normal. Pupils are equal, round, and reactive to light.  Neck: Normal range of motion. Neck supple.  Cardiovascular: Normal rate and regular rhythm.   Pulmonary/Chest: No respiratory distress. He has decreased breath sounds. He has rales.  Abdominal: Soft. Bowel sounds are normal. There is no tenderness. There is no rebound and no guarding.  Musculoskeletal: Normal range of motion. He exhibits edema.  Trace edema gout of the right first MTP  Neurological: He is alert and oriented to person, place, and time.  Skin: Skin is warm and dry. He is not diaphoretic.  Psychiatric: He has a normal mood and affect.    ED Course  Procedures (including critical care time) Labs Review Labs Reviewed  BASIC METABOLIC PANEL - Abnormal; Notable for the following:    Glucose, Bld 105 (*)    Creatinine, Ser 1.67 (*)    GFR calc non Af Amer 50 (*)    GFR calc Af Amer 58 (*)    All other components within normal limits  CBC - Abnormal; Notable for the following:    WBC 11.8 (*)    Hemoglobin 12.1 (*)    HCT 37.7 (*)    MCV 72.2 (*)    MCH  23.2 (*)    RDW 16.6 (*)    All other components within normal limits  BRAIN NATRIURETIC PEPTIDE - Abnormal; Notable for the following:    B Natriuretic Peptide 1066.1 (*)    All other components within normal limits  I-STAT TROPOININ, ED    Imaging Review Dg Chest 2 View  12/28/2014   CLINICAL DATA:  Acute onset of shortness of breath, cough and chest tightness. Initial encounter.  EXAM: CHEST  2 VIEW  COMPARISON:  Chest radiograph performed 04/05/2014  FINDINGS: The lungs are well-aerated. Vascular congestion is noted, with bilateral central airspace opacities, likely reflecting  mild recurrent pulmonary edema. No pleural effusion or pneumothorax is seen.  The heart is borderline enlarged. No acute osseous abnormalities are seen.  IMPRESSION: Vascular congestion and borderline cardiomegaly, with bilateral central airspace opacities, likely reflecting mild recurrent pulmonary edema.   Electronically Signed   By: Garald Balding M.D.   On: 12/28/2014 01:01     EKG Interpretation   Date/Time:  Thursday December 27 2014 23:40:24 EDT Ventricular Rate:  91 PR Interval:  164 QRS Duration: 92 QT Interval:  423 QTC Calculation: 520 R Axis:   73 Text Interpretation:  Sinus rhythm Biatrial enlargement Borderline T wave  abnormalities Prolonged QT interval Confirmed by [facility]  MD, Dwayne Bulkley  ([id number]) on 12/28/2014 1:44:44 AM      MDM   Final diagnoses:  CHF (congestive heart failure), NYHA class I, unspecified failure chronicity, systolic   Medications  nitroGLYCERIN (NITROGLYN) 2 % ointment 1 inch (not administered)  colchicine tablet 0.6 mg (not administered)  predniSONE (DELTASONE) tablet 60 mg (not administered)  furosemide (LASIX) injection 40 mg (40 mg Intravenous Given 12/28/14 0141)   Admit to inpatient  Juliano Mceachin, MD 12/28/14 [phone]

## 2014-12-28 NOTE — ED Notes (Signed)
Patient returned from XR. 

## 2014-12-28 NOTE — Consult Note (Signed)
CARDIOLOGY CONSULT NOTE       Patient ID: Jeffrey [name] MRN: [id number] DOB/AGE: [birth year] 39 y.o.  Admit date: 12/27/2014 Referring Physician: Tana Coast Primary Physician: Angelica Chessman, MD Primary Cardiologist:  [name] Reason for Consultation:  Chest Pain  Principal Problem:   Chest pain Active Problems:   Diabetes mellitus type 2, uncontrolled   Morbid obesity   OSA (obstructive sleep apnea)   Gout attack   Acute on chronic systolic CHF (congestive heart failure)   Accelerated hypertension   Elevated troponin I level   HPI:   39 y.o. admitted with dyspne and chest pain   He has chronic systolic HF - presumed NICM - normal nuclear in 2013, renal cell carcinoma with prior right nephrectomy in 2014, DM, morbid obesity, gout, HTN, sleep apnea and tobacco abuse. Most recently d/c on 7/15 after not taking meds for a few weeks.  Has f/u with Dr Harrington Challenger twice since then  Compliance seems to be biggest issue.  Been increasingly dyspnic last 48 hours with PND/Orthopnea  Not taking at least insulin for past few days.  Very high salt diet.  He is obese and myovue has not been updated  Cath not pursued due to nephrectomy and baseline Cr 1.6 with diabetes.  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  Currently pain free.  Pain was atypical stabbing sharp and related to dyspnea and taking deep breath  Most recently presented with worsening shortness of breath and chest pain - had been out of his medicines for a couple of weeks - showed up in heart failure/pulmonary edema. Was diuresed. Started back on his medicines. Minimal rise in troponin - felt to be due to CHF and not NSTEMI. Was to follow up with outpatient cardiology for consideration of repeat noninvasive testing versus cardiac cath - has CKD and has had prior nephrectomy.  ROS All other systems reviewed and negative except as noted above  Past Medical History  Diagnosis Date  .  Hypertension   . Morbid obesity   . History of gout   . Chronic systolic CHF (congestive heart failure)     a. EF 20-25% in 2012. b. EF 45-50% in 10/2011 with nonischemic nuc - presumed NICM. c. 04/2014: severely dilated LV, EF 15%, akinesis of the basal-mid inferior and inferoseptal myocardium, restrictive physiology, mild focal basal hypertrophy of the septum, trivial MR, severely dilated LA, moderately reduced RV systolic fcn.  . Kidney stones   . OSA on CPAP   . High cholesterol   . Childhood asthma   . Type II diabetes mellitus   . Anxiety   . Renal cell carcinoma     a. s/p Rt robotic assisted partial converted to radical nephrectomy on 01/2013.  . Troponin level elevated     a. 04/2014: felt due to CHF.  . CKD (chronic kidney disease) stage 2, GFR 60-89 ml/min   . Aspirin allergy     Family History  Problem Relation Age of Onset  . Diabetes Other   . Hypertension Other   . Heart attack Neg Hx   . Stroke Neg Hx   . Hypertension Mother   . Diabetes Maternal Aunt     History   Social History  . Marital Status: Single    Spouse Name: N/A  . Number of Children: N/A  . Years of Education: N/A   Occupational History  . Not on file.   Social History  Main Topics  . Smoking status: Former Smoker -- 0.25 packs/day for 22 years    Types: Cigarettes    Quit date: 03/27/2014  . Smokeless tobacco: Never Used  . Alcohol Use: 9.6 oz/week    16 Shots of liquor per week     Comment: 04/05/2014 "1/2 pint/weekend nite; total ~ 1 1/2 pint brandy"  . Drug Use: Yes    Special: Marijuana     Comment: 04/05/2014 "last marijuana was 04/01/2014"  . Sexual Activity: Yes   Other Topics Concern  . Not on file   Social History Narrative    Past Surgical History  Procedure Laterality Date  . Robotic assited partial nephrectomy Right 01/13/2013    Procedure: ROBOTIC ASSITED PARTIAL NEPHRECTOMY CONVERTED TO ROBOTIC ASSISTED RIGHT RADICAL NEPHRECTOMY;  Surgeon: Alexis Frock, MD;  Location: [facility];  Service: Urology;  Laterality: Right;  . Robotic assisted laparoscopic lysis of adhesion  01/13/2013    Procedure: ROBOTIC ASSISTED LAPAROSCOPIC LYSIS OF ADHESION EXTENSIVE;  Surgeon: Alexis Frock, MD;  Location: [facility];  Service: Urology;;  . Appendectomy  07/2004     . carvedilol  12.5 mg Oral BID  . colchicine  0.6 mg Oral Daily  . furosemide  40 mg Intravenous Daily  . heparin subcutaneous  5,000 Units Subcutaneous 3 times per day  . insulin aspart  0-15 Units Subcutaneous Q6H  . lisinopril  5 mg Oral Daily  . predniSONE  50 mg Oral Q breakfast      Physical Exam: Blood pressure 152/111, pulse 86, temperature 97.6 F (36.4 C), temperature source Oral, resp. rate 27, height 5' 7.5" (1.715 m), weight 297 lb 6.4 oz (134.9 kg), SpO2 97 %.    Affect appropriate Obese black male  HEENT: normal Neck supple with no adenopathy JVP normal no bruits no thyromegaly Lungs basilar rales  wheezing and good diaphragmatic motion Heart:  S1/S2 no murmur, no rub, gallop or click PMI normal Abdomen: benighn, BS positve, no tenderness, no AAA Post lap right nephrectomy no bruit.  No HSM or HJR Distal pulses intact with no bruits Plus 2 LE  edema Neuro non-focal Skin warm and dry No muscular weakness   Labs:   Lab Results  Component Value Date   WBC 11.8* 12/28/2014   HGB 12.1* 12/28/2014   HCT 37.7* 12/28/2014   MCV 72.2* 12/28/2014   PLT 380 12/28/2014    Recent Labs Lab 12/28/14 0428  NA 138  K 4.0  CL 108  CO2 21  BUN 19  CREATININE 1.60*  CALCIUM 9.0  PROT 7.1  BILITOT 0.8  ALKPHOS 66  ALT 35  AST 30  GLUCOSE 125*   Lab Results  Component Value Date   CKTOTAL 177 11/10/2011   CKMB 1.7 10/23/2011   TROPONINI 0.06* 12/28/2014    Lab Results  Component Value Date   CHOL 142 03/31/2013   CHOL  10/16/2010    167        ATP III CLASSIFICATION:  <200     mg/dL   Desirable  200-239  mg/dL   Borderline High  >=240    mg/dL   High          CHOL 167  10/16/2010   Lab Results  Component Value Date   HDL 25.80* 03/31/2013   HDL 27* 10/16/2010   HDL 27 10/16/2010   Lab Results  Component Value Date   LDLCALC 87 03/31/2013   LDLCALC * 10/16/2010    106  Total Cholesterol/HDL:CHD Risk Coronary Heart Disease Risk Table                     Men   Women  1/2 Average Risk   3.4   3.3  Average Risk       5.0   4.4  2 X Average Risk   9.6   7.1  3 X Average Risk  23.4   11.0        Use the calculated Patient Ratio above and the CHD Risk Table to determine the patient's CHD Risk.        ATP III CLASSIFICATION (LDL):  <100     mg/dL   Optimal  100-129  mg/dL   Near or Above                    Optimal  130-159  mg/dL   Borderline  160-189  mg/dL   High  >190     mg/dL   Very High   LDLCALC 106 10/16/2010   Lab Results  Component Value Date   TRIG 144.0 03/31/2013   TRIG 172* 10/16/2010   TRIG 172 10/16/2010   Lab Results  Component Value Date   CHOLHDL 6 03/31/2013   CHOLHDL 6.2 10/16/2010   CHOLHDL 6.2 10/16/2010   No results found for: LDLDIRECT    Radiology: Dg Chest 2 View  12/28/2014   CLINICAL DATA:  Acute onset of shortness of breath, cough and chest tightness. Initial encounter.  EXAM: CHEST  2 VIEW  COMPARISON:  Chest radiograph performed 04/05/2014  FINDINGS: The lungs are well-aerated. Vascular congestion is noted, with bilateral central airspace opacities, likely reflecting mild recurrent pulmonary edema. No pleural effusion or pneumothorax is seen.  The heart is borderline enlarged. No acute osseous abnormalities are seen.  IMPRESSION: Vascular congestion and borderline cardiomegaly, with bilateral central airspace opacities, likely reflecting mild recurrent pulmonary edema.   Electronically Signed   By: Garald Balding M.D.   On: 12/28/2014 01:01    EKG: NSR rate 91  Nonspecific ST changes No acute St changes   ASSESSMENT AND PLAN:  Chest Pain:  Atypical related to likely CHF exacerbation R/O CPK  negative Troponin .05 no acute ECG changes  Get back on home meds and Rx CHF  Had myovue 04/12/14 That was read as abnormal by Dr Ron Parker ( Stress Images: There is a medium size area of moderate decreased uptake affecting the mid/apical inferior segments, apical cap, and the apical anterior segment. There is no significant reversibility.)  But not thought to warrant cath by Dr Harrington Challenger  EF was 29%  Suspect he will be ready for d/c Sunday Outpatient f/u [facility].  At some point he will need cath and consideration for AICD if EF remains down.  Dr Alan Ripper note indicates f/u echo 3 months and demonstrate compliance with meds which he is not doing CHF:  Mild  Continue iv lasix , beta blocker and ACE   Renal Cell:  Post right nephrectomy  Baseline Cr 1.6 follow with diuresis   Signed: Jenkins Rouge 12/28/2014, 8:31 AM

## 2014-12-28 NOTE — Progress Notes (Signed)
RT Note:  CPAP setup at bedside, auto titrate settings (min 5.0 max 20.0) cm H20 with nasal mask.  Patient will call when ready for machine use.

## 2014-12-28 NOTE — ED Notes (Addendum)
Patient c/o cough x 3 days intermittently productive with clear mucous, SOB with chest tightness that started this afternoon. Patient also c/o gout pain to his right foot. Patient states he has taken Colchicine in the past with great success, states he has been out of this medication >1 month.

## 2014-12-29 LAB — GLUCOSE, CAPILLARY
GLUCOSE-CAPILLARY: 138 mg/dL — AB (ref 70–99)
GLUCOSE-CAPILLARY: 148 mg/dL — AB (ref 70–99)
Glucose-Capillary: 124 mg/dL — ABNORMAL HIGH (ref 70–99)
Glucose-Capillary: 129 mg/dL — ABNORMAL HIGH (ref 70–99)
Glucose-Capillary: 142 mg/dL — ABNORMAL HIGH (ref 70–99)
Glucose-Capillary: 150 mg/dL — ABNORMAL HIGH (ref 70–99)

## 2014-12-29 LAB — BASIC METABOLIC PANEL
ANION GAP: 8 (ref 5–15)
BUN: 23 mg/dL (ref 6–23)
CO2: 24 mmol/L (ref 19–32)
CREATININE: 1.56 mg/dL — AB (ref 0.50–1.35)
Calcium: 8.7 mg/dL (ref 8.4–10.5)
Chloride: 107 mmol/L (ref 96–112)
GFR calc Af Amer: 63 mL/min — ABNORMAL LOW (ref >90)
GFR calc non Af Amer: 54 mL/min — ABNORMAL LOW (ref >90)
GLUCOSE: 132 mg/dL — AB (ref 70–99)
Potassium: 4 mmol/L (ref 3.5–5.1)
Sodium: 139 mmol/L (ref 135–145)

## 2014-12-29 LAB — HEMOGLOBIN A1C
Hgb A1c MFr Bld: 6.4 % — ABNORMAL HIGH (ref 4.8–5.6)
MEAN PLASMA GLUCOSE: 137 mg/dL

## 2014-12-29 LAB — PROTIME-INR
INR: 1.11 (ref 0.00–1.49)
Prothrombin Time: 14.5 seconds (ref 11.6–15.2)

## 2014-12-29 MED ORDER — CARVEDILOL 6.25 MG PO TABS
18.7500 mg | ORAL_TABLET | Freq: Two times a day (BID) | ORAL | Status: DC
  Administered 2014-12-29 – 2014-12-30 (×2): 18.75 mg via ORAL
  Filled 2014-12-29 (×4): qty 1

## 2014-12-29 MED ORDER — ALLOPURINOL 150 MG HALF TABLET
150.0000 mg | ORAL_TABLET | Freq: Every day | ORAL | Status: DC
  Administered 2014-12-29 – 2014-12-30 (×2): 150 mg via ORAL
  Filled 2014-12-29 (×2): qty 1

## 2014-12-29 MED ORDER — ISOSORB DINITRATE-HYDRALAZINE 20-37.5 MG PO TABS
1.0000 | ORAL_TABLET | Freq: Two times a day (BID) | ORAL | Status: DC
  Administered 2014-12-29 – 2014-12-30 (×3): 1 via ORAL
  Filled 2014-12-29 (×4): qty 1

## 2014-12-29 MED ORDER — TORSEMIDE 20 MG PO TABS
20.0000 mg | ORAL_TABLET | Freq: Two times a day (BID) | ORAL | Status: DC
  Administered 2014-12-29 – 2014-12-30 (×3): 20 mg via ORAL
  Filled 2014-12-29 (×5): qty 1

## 2014-12-29 MED ORDER — FUROSEMIDE 40 MG PO TABS: 80.0000 mg | ORAL_TABLET | Freq: Two times a day (BID) | ORAL | Status: DC

## 2014-12-29 NOTE — Progress Notes (Addendum)
Patient Name: Jeffrey [name]      SUBJECTIVE: 39 yo male with longstanding LV dysfunction admitted with CP in setting on presumed nonischemic CM,,TN elevation not suggestive of ischemic event  Rx  BB and ACE  Pmhx  renalc cell CA with nephrectomy, DM OSA   No cp or sob today  Past Medical History  Diagnosis Date  . Hypertension   . Morbid obesity   . History of gout   . Chronic systolic CHF (congestive heart failure)     a. EF 20-25% in 2012. b. EF 45-50% in 10/2011 with nonischemic nuc - presumed NICM. c. 04/2014: severely dilated LV, EF 15%, akinesis of the basal-mid inferior and inferoseptal myocardium, restrictive physiology, mild focal basal hypertrophy of the septum, trivial MR, severely dilated LA, moderately reduced RV systolic fcn.  . Kidney stones   . OSA on CPAP   . High cholesterol   . Childhood asthma   . Type II diabetes mellitus   . Anxiety   . Renal cell carcinoma     a. s/p Rt robotic assisted partial converted to radical nephrectomy on 01/2013.  . Troponin level elevated     a. 04/2014: felt due to CHF.  . CKD (chronic kidney disease) stage 2, GFR 60-89 ml/min   . Aspirin allergy     Scheduled Meds:  Scheduled Meds: . allopurinol  150 mg Oral Daily  . carvedilol  12.5 mg Oral BID  . colchicine  0.6 mg Oral Daily  . furosemide  40 mg Intravenous Daily  . heparin subcutaneous  5,000 Units Subcutaneous 3 times per day  . insulin aspart  0-15 Units Subcutaneous TID WC  . insulin aspart  0-5 Units Subcutaneous QHS  . lisinopril  5 mg Oral Daily  . predniSONE  50 mg Oral Q breakfast   Continuous Infusions:  morphine injection, oxyCODONE-acetaminophen    PHYSICAL EXAM Filed Vitals:   12/28/14 1852 12/28/14 2022 12/29/14 0013 12/29/14 0527  BP:  153/107 148/107 120/75  Pulse: 83 85  72  Temp:  97.8 F (36.6 C)  97.9 F (36.6 C)  TempSrc:  Oral  Oral  Resp: 32 34  32  Height:      Weight:    292 lb 5.3 oz (132.6 kg)  SpO2: 96% 96%  95%    Well developed and Morbidly obese  in no acute distress HENT normal Neck supple with JVP-flat Clear Regular rate and rhythm, no murmurs or gallops Abd-soft with active BS No Clubbing cyanosis edema Skin-warm and dry A & Oriented  Grossly normal sensory and motor function  TELEMETRY: Reviewed telemetry pt in NSR with AIVR and nonsustained atrial tach   Intake/Output Summary (Last 24 hours) at 12/29/14 0809 Last data filed at 12/29/14 0526  Gross per 24 hour  Intake      0 ml  Output    925 ml  Net   -925 ml    LABS: Basic Metabolic Panel:  Recent Labs Lab 12/28/14 0023 12/28/14 0428 12/29/14 0522  NA 138 138 139  K 4.1 4.0 4.0  CL 110 108 107  CO2 21 21 24   GLUCOSE 105* 125* 132*  BUN 20 19 23   CREATININE 1.67* 1.60* 1.56*  CALCIUM 9.2 9.0 8.7  MG  --  1.6  --    Cardiac Enzymes:  Recent Labs  12/28/14 0428 12/28/14 0949 12/28/14 1615  TROPONINI 0.06* 0.05* 0.04*   CBC:  Recent Labs Lab 12/28/14 0023  WBC 11.8*  HGB 12.1*  HCT 37.7*  MCV 72.2*  PLT 380   PROTIME:  Recent Labs  12/28/14 0428 12/29/14 0522  LABPROT 14.6 14.5  INR 1.12 1.11   Liver Function Tests:  Recent Labs  12/28/14 0428  AST 30  ALT 35  ALKPHOS 66  BILITOT 0.8  PROT 7.1  ALBUMIN 3.5   No results for input(s): LIPASE, AMYLASE in the last 72 hours. BNP: BNP (last 3 results)  Recent Labs  12/28/14 0023  BNP 1066.1*    ProBNP (last 3 results)  Recent Labs  04/07/14 0018 04/18/14 1203 07/05/14 0906  PROBNP 1464.0* 72.0 163.0*    D-Dimer:  Recent Labs  12/28/14 1010  DDIMER 1.47*   Hemoglobin A1C:  Recent Labs  12/28/14 0949  HGBA1C 6.4*   Fasting Lipid Panel: No results for input(s): CHOL, HDL, LDLCALC, TRIG, CHOLHDL, LDLDIRECT in the last 72 hours. Thyroid Function Tests:  Recent Labs  12/28/14 0428  TSH 2.671   Anemia Panel: No results for input(s): VITAMINB12, FOLATE, FERRITIN, TIBC, IRON, RETICCTPCT in the last 72  hours.       ASSESSMENT AND PLAN:  Principal Problem:   Chest pain Active Problems:   Diabetes mellitus type 2, uncontrolled   Morbid obesity   OSA (obstructive sleep apnea)   Gout attack   Acute on chronic systolic CHF (congestive heart failure)   Accelerated hypertension   Elevated troponin I level   CHF (congestive heart failure), NYHA class I  Spoke for a long time regarding symptoms, prognosis and the importance of compliance  Would add Bidil with elevated Cr but hope to be able to add aldactone later  If he complies would recommend outpt consideration for ICD  Needs to lose weight and he is trying  Change IV >>po diuretic  Increase coreg  Euvolemic   Signed, Virl Axe MD  12/29/2014

## 2014-12-29 NOTE — Progress Notes (Signed)
[facility]                                                                              Patient Demographics  Jeffrey [name], is a 39 y.o. male, DOB - 12-21-1975, SHF:[id number]  Admit date - 12/27/2014   Admitting Physician Lavina Hamman, MD  Outpatient Primary MD for the patient is Angelica Chessman, MD  LOS - 1   Chief Complaint  Patient presents with  . Shortness of Breath       Brief HPI   Jeffrey [name] is a 39 y.o. male with Past medical history of chronic systolic CHF with EF of 50%, morbid obesity, obstructive sleep apnea, hyperlipidemia, diabetes mellitus, chronic kidney disease, renal cell carcinoma, hypertension. The patient presented with complaints of chest pain and shortness of breath, ongoing for last 2 days prior to admission, progressively worsening with orthopnea and PND. Patient noted that chest pain started on the day of admission, substernal, worse with breathing, stabbing. He also reports right foot pain secondary to acute gout attack.    Assessment & Plan    Principal Problem:  Atypical Chest pain with mildly elevated troponin, acute on chronic systolic CHF : Risk factors CHF with cardiomyopathy EF 29%, had a Myoview in 04/2014, troponin elevated, EKG with no ST-T wave changes suggestive of acute ischemia. - Continue serial cardiac enzymes, d-dimer, cardiology consulted - Per cardiology, Recent Myoview in 7/15 with moderate decreased uptake affecting the mid/apical inferior segments, apical and apical clear segment with no reversibility. EF was 29%. - No plans for cardiac cath at this time, consideration for AICD if EF remains low. Follow up outpatient with cardiology. - IV Lasix discontinued, placed on oral torsemide today - Patient counseled on low-salt diet, lose weight - Added BiDil by cardiology - CT angiogram chest negative for PE, 2-D echo showed mild LVH, severely depressed EF with diffuse hypokinesis   Active Problems: Acute gout  attack, Right foot- improving - ESR 11, CRP 1.2, uric acid 10.7.  - Placed on allopurinol, continue colchicine and prednisone  - Start PT today     Diabetes mellitus type 2, uncontrolled - Hemoglobin A1c 6.4  placed on sliding scale insulin    Morbid obesity - Patient counseled on diet and weight control and medication compliance.     OSA (obstructive sleep apnea) - Placed on CPAP qhs    Accelerated hypertension - BP much better controlled now with the current cardiac medications. Will continue the same regimen.   Substance abuse Urine drug screen positive for THC, counseled patient  Code Status: Full code   Family Communication: Discussed in detail with the patient, all imaging results, lab results explained to the patient   Disposition Plan: DC in a.m.  Time Spent in minutes   30 mins  Procedures  none  Consults   cardiology  DVT Prophylaxis   heparin   Medications  Scheduled Meds: . allopurinol  150 mg Oral Daily  . carvedilol  18.75 mg Oral BID  . colchicine  0.6 mg Oral Daily  . heparin subcutaneous  5,000 Units Subcutaneous 3 times per day  . insulin aspart  0-15 Units Subcutaneous TID WC  .  insulin aspart  0-5 Units Subcutaneous QHS  . isosorbide-hydrALAZINE  1 tablet Oral BID  . lisinopril  5 mg Oral Daily  . predniSONE  50 mg Oral Q breakfast  . torsemide  20 mg Oral BID   Continuous Infusions:  PRN Meds:.morphine injection, oxyCODONE-acetaminophen   Antibiotics   Anti-infectives    None        Subjective:   Jeffrey [name] was seen and examined today. Feels a whole lot better today, states right foot gout is improving, still has intermittent shortness of breath improving, no chest pain. No productive cough, fevers or chills, nausea or vomiting    Objective:   Blood pressure 120/75, pulse 72, temperature 97.9 F (36.6 C), temperature source Oral, resp. rate 32, height 5' 7.5" (1.715 m), weight 132.6 kg (292 lb 5.3 oz), SpO2 95 %.  Wt  Readings from Last 3 Encounters:  12/29/14 132.6 kg (292 lb 5.3 oz)  10/15/14 133.358 kg (294 lb)  07/05/14 129.729 kg (286 lb)     Intake/Output Summary (Last 24 hours) at 12/29/14 1132 Last data filed at 12/29/14 0819  Gross per 24 hour  Intake    360 ml  Output    925 ml  Net   -565 ml    Exam  General: Alert and oriented x 3, NAD  HEENT:  PERRLA, EOMI, Anicteic Sclera, mucous membranes moist.   Neck: Supple, no JVD, no masses  CVS: S1 S2 auscultated, no rubs, murmurs or gallops. Regular rate and rhythm.  Respiratory: Decreased breath sounds at the bases otherwise fairly clear to auscultation  Abdomen: Soft, nontender, nondistended, + bowel sounds  Ext: no cyanosis clubbing, 2+ edema, right great toe/foot significantly improved.   Neuro: AAOx3, Cr N's II- XII. Strength 5/5 upper and lower extremities bilaterally  Skin: No rashes  Psych: Normal affect and demeanor, alert and oriented x3    Data Review   Micro Results Recent Results (from the past 240 hour(s))  MRSA PCR Screening     Status: None   Collection Time: 12/28/14  5:39 AM  Result Value Ref Range Status   MRSA by PCR NEGATIVE NEGATIVE Final    Comment:        The GeneXpert MRSA Assay (FDA approved for NASAL specimens only), is one component of a comprehensive MRSA colonization surveillance program. It is not intended to diagnose MRSA infection nor to guide or monitor treatment for MRSA infections.     Radiology Reports Dg Chest 2 View  12/28/2014   CLINICAL DATA:  Acute onset of shortness of breath, cough and chest tightness. Initial encounter.  EXAM: CHEST  2 VIEW  COMPARISON:  Chest radiograph performed 04/05/2014  FINDINGS: The lungs are well-aerated. Vascular congestion is noted, with bilateral central airspace opacities, likely reflecting mild recurrent pulmonary edema. No pleural effusion or pneumothorax is seen.  The heart is borderline enlarged. No acute osseous abnormalities are seen.   IMPRESSION: Vascular congestion and borderline cardiomegaly, with bilateral central airspace opacities, likely reflecting mild recurrent pulmonary edema.   Electronically Signed   By: Garald Balding M.D.   On: 12/28/2014 01:01   Ct Angio Chest Pe W/cm &/or Wo Cm  12/28/2014   CLINICAL DATA:  Hypoxia, shortness of Breath  EXAM: CT ANGIOGRAPHY CHEST WITH CONTRAST  TECHNIQUE: Multidetector CT imaging of the chest was performed using the standard protocol during bolus administration of intravenous contrast. Multiplanar CT image reconstructions and MIPs were obtained to evaluate the vascular anatomy.  CONTRAST:  74m OMNIPAQUE  IOHEXOL 300 MG/ML  SOLN  COMPARISON:  None.  FINDINGS: Images of the thoracic inlet are unremarkable. Central airways are patent. No mediastinal hematoma or adenopathy. No hilar adenopathy. Borderline cardiomegaly. No pericardial effusion. The visualized upper abdomen is unremarkable.  The study is of excellent technical quality. There is no pulmonary embolus.  Images of the lung parenchyma shows no acute infiltrate or pleural effusion. Minimal ground-glass attenuation of lung parenchyma bilateral lower lobes probable due to hypoventilatory changes. No confluent consolidation or infiltrate. No pulmonary edema. No pneumothorax. No destructive bony lesions are noted. Sagittal images of the spine shows mild degenerative changes mid and lower thoracic spine.  Review of the MIP images confirms the above findings.  IMPRESSION: 1. No pulmonary embolus is noted. 2. Mild degenerative changes mid and lower thoracic spine. 3. No acute infiltrate or pulmonary edema. 4. Borderline cardiomegaly.   Electronically Signed   By: Lahoma Crocker M.D.   On: 12/28/2014 17:10    CBC  Recent Labs Lab 12/28/14 0023  WBC 11.8*  HGB 12.1*  HCT 37.7*  PLT 380  MCV 72.2*  MCH 23.2*  MCHC 32.1  RDW 16.6*    Chemistries   Recent Labs Lab 12/28/14 0023 12/28/14 0428 12/29/14 0522  NA 138 138 139  K 4.1  4.0 4.0  CL 110 108 107  CO2 '21 21 24  ' GLUCOSE 105* 125* 132*  BUN '20 19 23  ' CREATININE 1.67* 1.60* 1.56*  CALCIUM 9.2 9.0 8.7  MG  --  1.6  --   AST  --  30  --   ALT  --  35  --   ALKPHOS  --  66  --   BILITOT  --  0.8  --    ------------------------------------------------------------------------------------------------------------------ estimated creatinine clearance is 83.2 mL/min (by C-G formula based on Cr of 1.56). ------------------------------------------------------------------------------------------------------------------  Recent Labs  12/28/14 0949  HGBA1C 6.4*   ------------------------------------------------------------------------------------------------------------------ No results for input(s): CHOL, HDL, LDLCALC, TRIG, CHOLHDL, LDLDIRECT in the last 72 hours. ------------------------------------------------------------------------------------------------------------------  Recent Labs  12/28/14 0428  TSH 2.671   ------------------------------------------------------------------------------------------------------------------ No results for input(s): VITAMINB12, FOLATE, FERRITIN, TIBC, IRON, RETICCTPCT in the last 72 hours.  Coagulation profile  Recent Labs Lab 12/28/14 0428 12/29/14 0522  INR 1.12 1.11     Recent Labs  12/28/14 1010  DDIMER 1.47*    Cardiac Enzymes  Recent Labs Lab 12/28/14 0428 12/28/14 0949 12/28/14 1615  TROPONINI 0.06* 0.05* 0.04*   ------------------------------------------------------------------------------------------------------------------ Invalid input(s): POCBNP   Recent Labs  12/28/14 1224 12/28/14 1812 12/28/14 2028 12/29/14 0010 12/29/14 0424 12/29/14 0728  GLUCAP 161* 137* 143* 124* 142* 129*     RAI,RIPUDEEP M.D. [facility] 12/29/2014, 11:32 AM  Pager: [phone]   Between 7am to 7pm - call Pager - [phone]  After 7pm go to [email or web address] - password [email or web address]  Call night  coverage person covering after 7pm

## 2014-12-29 NOTE — Progress Notes (Signed)
Nasal pillows brought in by family member to facilitate the use of nocturnal CPAP. However, the main adapter from the tubing to the pillows did not come along. Patient is aware that this adapter is not present and need to be used for the pillows to connect to the tubing, including his home tubing. He declines the use of CPAP tonight due to the inability to tolerate the supplied apparatus from the hospital. He agrees to have his home machine delivered to him, along with all of his tubing and pillow pieces, in the morning. Equipment remains at his bedside for use if he should change his mind and attempt to try again. RT will continue to follow.

## 2014-12-29 NOTE — Progress Notes (Signed)
Placed patient on CPAP via nasal mask.  Patient unable to tolerate after approximately 10 minutes, due to the mask being uncomfortable.  Placed patient back on 2 lpm nasal cannula.  Encouraged patient to have someone bring his mask from home for the rest of hospital stay.

## 2014-12-30 DIAGNOSIS — I5043 Acute on chronic combined systolic (congestive) and diastolic (congestive) heart failure: Secondary | ICD-10-CM

## 2014-12-30 DIAGNOSIS — I5033 Acute on chronic diastolic (congestive) heart failure: Secondary | ICD-10-CM

## 2014-12-30 LAB — BASIC METABOLIC PANEL
ANION GAP: 6 (ref 5–15)
BUN: 30 mg/dL — AB (ref 6–23)
CALCIUM: 8.9 mg/dL (ref 8.4–10.5)
CHLORIDE: 105 mmol/L (ref 96–112)
CO2: 28 mmol/L (ref 19–32)
Creatinine, Ser: 1.6 mg/dL — ABNORMAL HIGH (ref 0.50–1.35)
GFR, EST AFRICAN AMERICAN: 61 mL/min — AB (ref >90)
GFR, EST NON AFRICAN AMERICAN: 52 mL/min — AB (ref >90)
Glucose, Bld: 116 mg/dL — ABNORMAL HIGH (ref 70–99)
Potassium: 4.2 mmol/L (ref 3.5–5.1)
Sodium: 139 mmol/L (ref 135–145)

## 2014-12-30 LAB — MAGNESIUM: MAGNESIUM: 1.9 mg/dL (ref 1.5–2.5)

## 2014-12-30 LAB — PROTIME-INR
INR: 1.03 (ref 0.00–1.49)
Prothrombin Time: 13.6 seconds (ref 11.6–15.2)

## 2014-12-30 LAB — GLUCOSE, CAPILLARY: GLUCOSE-CAPILLARY: 117 mg/dL — AB (ref 70–99)

## 2014-12-30 MED ORDER — TORSEMIDE 20 MG PO TABS: 20.0000 mg | ORAL_TABLET | Freq: Two times a day (BID) | ORAL | Status: DC

## 2014-12-30 MED ORDER — OXYCODONE-ACETAMINOPHEN 5-325 MG PO TABS: 1.0000 | ORAL_TABLET | Freq: Four times a day (QID) | ORAL | Status: DC | PRN

## 2014-12-30 MED ORDER — PREDNISONE 10 MG PO TABS: ORAL_TABLET | ORAL | Status: DC

## 2014-12-30 MED ORDER — LISINOPRIL 5 MG PO TABS: 5.0000 mg | ORAL_TABLET | Freq: Every day | ORAL | Status: DC

## 2014-12-30 MED ORDER — CARVEDILOL 6.25 MG PO TABS: 18.7500 mg | ORAL_TABLET | Freq: Two times a day (BID) | ORAL | Status: DC

## 2014-12-30 MED ORDER — INSULIN NPH ISOPHANE & REGULAR (70-30) 100 UNIT/ML ~~LOC~~ SUSP: 34.0000 [IU] | Freq: Two times a day (BID) | SUBCUTANEOUS | Status: DC

## 2014-12-30 MED ORDER — ISOSORB DINITRATE-HYDRALAZINE 20-37.5 MG PO TABS: 1.0000 | ORAL_TABLET | Freq: Two times a day (BID) | ORAL | Status: DC

## 2014-12-30 MED ORDER — COLCHICINE 0.6 MG PO TABS: 0.6000 mg | ORAL_TABLET | Freq: Every day | ORAL | Status: DC

## 2014-12-30 MED ORDER — ALLOPURINOL 100 MG PO TABS: 100.0000 mg | ORAL_TABLET | Freq: Every day | ORAL | Status: DC

## 2014-12-30 NOTE — Progress Notes (Signed)
PT Cancellation Note  Patient Details Name: JAVAUN DIMPERIO MRN: [id number] DOB: 07-28-1976   Cancelled Treatment:     PT eval order received.  RN advises pt has dc order for this date and is mobilizing IND in room.  PT deferred at this time.  Please re-order if status changes.   Darin Arndt 12/30/2014, 2:38 PM

## 2014-12-30 NOTE — Discharge Summary (Signed)
Physician Discharge Summary   Patient ID: Jeffrey [name] MRN: [id number] DOB/AGE: December 19, 1975 39 y.o.  Admit date: 12/27/2014 Discharge date: 12/30/2014  Primary Care Physician:  Jeffrey Chessman, MD  Discharge Diagnoses:    . atypical Chest pain . Acute on chronic systolic CHF (congestive heart failure) . Accelerated hypertension . Acute right foot Gout attack . Diabetes mellitus type 2, uncontrolled . OSA (obstructive sleep apnea) . Morbid obesity . Elevated troponin I level  Consults: Cardiology, Dr. Caryl Comes   Recommendations for Outpatient Follow-up:  Patient was started on BiDil by cardiology, restarted on torsemide, lisinopril decreased to 5 mg daily  Coreg increased  Patient was strongly recommended to follow up in heart failure clinic  Patient was strongly recommended to lose weight, symptom control and importance of compliance.  He will need to be bmet in a week  TESTS THAT NEED FOLLOW-UP BMET at the follow-up appointment   DIET: Carb modified heart healthy diet    Allergies:   Allergies  Allergen Reactions  . Aspirin Shortness Of Breath and Rash    Rash, itching, burning sensation  (Patient reports he tolerates other NSAIDS)   . Bee Venom Hives and Swelling  . Tomato Rash     Discharge Medications:   Medication List    STOP taking these medications        ibuprofen 200 MG tablet  Commonly known as:  ADVIL,MOTRIN      TAKE these medications        acetaminophen 500 MG tablet  Commonly known as:  TYLENOL  Take 1,000 mg by mouth every 6 (six) hours as needed for mild pain.     allopurinol 100 MG tablet  Commonly known as:  ZYLOPRIM  Take 1 tablet (100 mg total) by mouth daily.     atorvastatin 80 MG tablet  Commonly known as:  LIPITOR  TAKE 1 TABLET (80 MG TOTAL) BY MOUTH EVERY EVENING.     carvedilol 6.25 MG tablet  Commonly known as:  COREG  Take 3 tablets (18.75 mg total) by mouth 2 (two) times daily.     colchicine 0.6 MG tablet   Take 1 tablet (0.6 mg total) by mouth daily.     EPINEPHrine 0.3 mg/0.3 mL Soaj injection  Commonly known as:  EPIPEN  Inject 0.3 mLs (0.3 mg total) into the muscle as needed (as needed for severe allergy).     insulin NPH-regular Human (70-30) 100 UNIT/ML injection  Commonly known as:  NOVOLIN 70/30  Inject 34 Units into the skin 2 (two) times daily with a meal.     isosorbide-hydrALAZINE 20-37.5 MG per tablet  Commonly known as:  BIDIL  Take 1 tablet by mouth 2 (two) times daily.     lisinopril 5 MG tablet  Commonly known as:  PRINIVIL,ZESTRIL  Take 1 tablet (5 mg total) by mouth daily.     OVER THE COUNTER MEDICATION  Take 1 tablet by mouth 3 (three) times daily.     oxyCODONE-acetaminophen 5-325 MG per tablet  Commonly known as:  PERCOCET/ROXICET  Take 1 tablet by mouth every 6 (six) hours as needed for severe pain.     predniSONE 10 MG tablet  Commonly known as:  DELTASONE  - Prednisone dosing: Take  Prednisone 47m (4 tabs) x 3 days, then taper to 321m(3 tabs) x 3 days, then 2074m2 tabs) x 3days, then 3m93m tab) x 3days, then OFF.  -   - Dispense:  30 tabs, refills: None  torsemide 20 MG tablet  Commonly known as:  DEMADEX  Take 1 tablet (20 mg total) by mouth 2 (two) times daily.         Brief H and P: For complete details please refer to admission H and P, but in brief Jeffrey [name] is a 39 y.o. male with Past medical history of chronic systolic CHF with EF of 51%, morbid obesity, obstructive sleep apnea, hyperlipidemia, diabetes mellitus, chronic kidney disease, renal cell carcinoma, hypertension. The patient presented with complaints of chest pain and shortness of breath, ongoing for last 2 days prior to admission, progressively worsening with orthopnea and PND. Patient noted that chest pain started on the day of admission, substernal, worse with breathing, stabbing. He also reports right foot pain secondary to acute gout attack.  Hospital Course:    Atypical Chest pain with mildly elevated troponin, acute on chronic systolic CHF : Risk factors CHF with cardiomyopathy EF 29%, had a Myoview in 04/2014, troponin elevated, EKG with no ST-T wave changes suggestive of acute ischemia. Patient was admitted to stepdown unit, serial cardiac enzymes were obtained, slightly positive at 0.06. Cardiology was consulted. Per cardiology, Recent Myoview in 7/15 with moderate decreased uptake affecting the mid/apical inferior segments, apical and apical clear segment with no reversibility. EF was 29%. No plans for cardiac cath at this time, consideration for AICD if EF remains low. Patient has been transitioned to oral torsemide. He had good diurese his with IV Lasix, negative balance of 7.1 L, weight down from 297 to 289lbs.  Per cardiology, added BiDil, increased Coreg, lisinopril decreased to 5 mg daily. Patient strongly recommended to be compliant with his medications, lose weight and strict low-salt diet. CT angiogram chest was negative for pulmonary embolism. 2-D echo showed mild LVH, severely depressed EF with diffuse hypokinesis. Patient would benefit from heart failure clinic appointment. For now he will follow-up with his cardiologist, Dr. Harrington Challenger. He will need BMET in next 1 week either at [facility] or cardiology appointment.   Acute gout attack, Right foot- improving - ESR 11, CRP 1.2, uric acid 10.7.  Patient was started on allopurinol, colchicine and prednisone. Cannot be on NSAIDs secondary to chronic kidney disease.    Diabetes mellitus type 2, uncontrolled - Hemoglobin A1c 6.4 continue insulin per her outpatient regimen, follow-up with PCP   Morbid obesity - Patient counseled on diet and weight control and medication compliance.    OSA (obstructive sleep apnea) - Placed on CPAP qhs   Accelerated hypertension - BP much better controlled now with the current cardiac medications. Patient recommended to be compliant with  his medications.  Substance abuse Urine drug screen positive for THC, counseled patient  Day of Discharge BP 118/92 mmHg  Pulse 58  Temp(Src) 97.8 F (36.6 C) (Oral)  Resp 18  Ht 5' 7.5" (1.715 m)  Wt 131.135 kg (289 lb 1.6 oz)  BMI 44.59 kg/m2  SpO2 100%  Physical Exam: General: Alert and awake oriented x3 not in any acute distress. HEENT: anicteric sclera, pupils reactive to light and accommodation CVS: S1-S2 clear no murmur rubs or gallops Chest: Decreased breath sounds at the bases Abdomen: soft nontender, nondistended, normal bowel sounds Extremities: no cyanosis, clubbing, trace edema noted bilaterally Neuro: Cranial nerves II-XII intact, no focal neurological deficits   The results of significant diagnostics from this hospitalization (including imaging, microbiology, ancillary and laboratory) are listed below for reference.    LAB RESULTS: Basic Metabolic Panel:  Recent Labs Lab 12/29/14 0522  12/30/14 0422  NA 139 139  K 4.0 4.2  CL 107 105  CO2 24 28  GLUCOSE 132* 116*  BUN 23 30*  CREATININE 1.56* 1.60*  CALCIUM 8.7 8.9  MG  --  1.9   Liver Function Tests:  Recent Labs Lab 12/28/14 0428  AST 30  ALT 35  ALKPHOS 66  BILITOT 0.8  PROT 7.1  ALBUMIN 3.5   No results for input(s): LIPASE, AMYLASE in the last 168 hours. No results for input(s): AMMONIA in the last 168 hours. CBC:  Recent Labs Lab 12/28/14 0023  WBC 11.8*  HGB 12.1*  HCT 37.7*  MCV 72.2*  PLT 380   Cardiac Enzymes:  Recent Labs Lab 12/28/14 0949 12/28/14 1615  TROPONINI 0.05* 0.04*   BNP: Invalid input(s): POCBNP CBG:  Recent Labs Lab 12/29/14 2152 12/30/14 0730  GLUCAP 148* 117*    Significant Diagnostic Studies:  Dg Chest 2 View  12/28/2014   CLINICAL DATA:  Acute onset of shortness of breath, cough and chest tightness. Initial encounter.  EXAM: CHEST  2 VIEW  COMPARISON:  Chest radiograph performed 04/05/2014  FINDINGS: The lungs are well-aerated.  Vascular congestion is noted, with bilateral central airspace opacities, likely reflecting mild recurrent pulmonary edema. No pleural effusion or pneumothorax is seen.  The heart is borderline enlarged. No acute osseous abnormalities are seen.  IMPRESSION: Vascular congestion and borderline cardiomegaly, with bilateral central airspace opacities, likely reflecting mild recurrent pulmonary edema.   Electronically Signed   By: Garald Balding M.D.   On: 12/28/2014 01:01   Ct Angio Chest Pe W/cm &/or Wo Cm  12/28/2014   CLINICAL DATA:  Hypoxia, shortness of Breath  EXAM: CT ANGIOGRAPHY CHEST WITH CONTRAST  TECHNIQUE: Multidetector CT imaging of the chest was performed using the standard protocol during bolus administration of intravenous contrast. Multiplanar CT image reconstructions and MIPs were obtained to evaluate the vascular anatomy.  CONTRAST:  17m OMNIPAQUE IOHEXOL 300 MG/ML  SOLN  COMPARISON:  None.  FINDINGS: Images of the thoracic inlet are unremarkable. Central airways are patent. No mediastinal hematoma or adenopathy. No hilar adenopathy. Borderline cardiomegaly. No pericardial effusion. The visualized upper abdomen is unremarkable.  The study is of excellent technical quality. There is no pulmonary embolus.  Images of the lung parenchyma shows no acute infiltrate or pleural effusion. Minimal ground-glass attenuation of lung parenchyma bilateral lower lobes probable due to hypoventilatory changes. No confluent consolidation or infiltrate. No pulmonary edema. No pneumothorax. No destructive bony lesions are noted. Sagittal images of the spine shows mild degenerative changes mid and lower thoracic spine.  Review of the MIP images confirms the above findings.  IMPRESSION: 1. No pulmonary embolus is noted. 2. Mild degenerative changes mid and lower thoracic spine. 3. No acute infiltrate or pulmonary edema. 4. Borderline cardiomegaly.   Electronically Signed   By: LLahoma CrockerM.D.   On: 12/28/2014 17:10     2D ECHO:  Study Conclusions  - Left ventricle: Poor acoustic windows even with use of echo contrast. Ovreall LVEF appears severely depresssed with diffuse hypokinesis. The cavity size was severely dilated. Wall thickness was increased in a pattern of mild LVH. - Mitral valve: Calcified annulus. Mildly thickened leaflets . There was mild regurgitation. - Left atrium: The atrium was severely dilated. - Right ventricle: The cavity size was mildly dilated. Systolic function was mildly reduced.  Disposition and Follow-up:     Discharge Instructions    (HEART FAILURE PATIENTS) Call MD:  Anytime you have  any of the following symptoms: 1) 3 pound weight gain in 24 hours or 5 pounds in 1 week 2) shortness of breath, with or without a dry hacking cough 3) swelling in the hands, feet or stomach 4) if you have to sleep on extra pillows at night in order to breathe.    Complete by:  As directed      Diet Carb Modified    Complete by:  As directed      Discharge instructions    Complete by:  As directed   It is VERY IMPORTANT that you follow up with a PCP on a regular basis.  Check your blood glucoses before each meal and at bedtime and maintain a log of your readings.  Bring this log with you when you follow up with your PCP so that he or she can adjust your insulin at your follow up visit.  Please ask Dr Harrington Challenger to set you up in CHF/heart failure clinic  Please note that some of your medications have been changed, new medications are added. Follow the directions closely.     Increase activity slowly    Complete by:  As directed             DISPOSITION;  home    DISCHARGE FOLLOW-UP Follow-up Information    Follow up with JEGEDE, OLUGBEMIGA, MD. Schedule an appointment as soon as possible for a visit in 10 days.   Specialty:  Internal Medicine   Why:  for hospital follow-up, you can go to walk-in appt for hospital follow-up, obtain labs BMET for kidney function   Contact  information:   [place] [address] [phone]       Follow up with [name], MD. Schedule an appointment as soon as possible for a visit in 10 days.   Specialty:  Cardiology   Why:  for hospital follow-up. Please ask Dr Harrington Challenger to set you in heart failure clinic.    Contact information:   [address] [place] [address] [phone]        Time spent on Discharge: 35 mins   Signed:   RAI,RIPUDEEP M.D. [facility] 12/30/2014, 10:17 AM Pager: [phone]

## 2014-12-30 NOTE — Progress Notes (Signed)
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 The left ventricle is dilated. There are areas with decreased activity which may represent scar. In the information above the history relates a nuclear scan in 2013 that was said to be normal with decreased ejection fraction. The current images are not normal. I cannot rule out significant scar and the possibility of very slight ischemia.z(JK) LV Ejection Fraction: 26%. LV Wall Motion: Global hypokinesis.  Rx  BB and ACE  Pmhx  Renal cell CA with nephrectomy, DM OSA   Feels better today, desires to go home  Past Medical History  Diagnosis Date  . Hypertension   . Morbid obesity   . History of gout   . Chronic systolic CHF (congestive heart failure)     a. EF 20-25% in 2012. b. EF 45-50% in 10/2011 with nonischemic nuc - presumed NICM. c. 04/2014: severely dilated LV, EF 15%, akinesis of the basal-mid inferior and inferoseptal myocardium, restrictive physiology, mild focal basal hypertrophy of the septum, trivial MR, severely dilated LA, moderately reduced RV systolic fcn.  . Kidney stones   . OSA on CPAP   . High cholesterol   . Childhood asthma   . Type II diabetes mellitus   . Anxiety   . Renal cell carcinoma     a. s/p Rt robotic assisted partial converted to radical nephrectomy on 01/2013.  . Troponin level elevated     a. 04/2014: felt due to CHF.  . CKD (chronic kidney disease) stage 2, GFR 60-89 ml/min   . Aspirin allergy     Scheduled Meds:  Scheduled Meds: . allopurinol  150 mg Oral Daily  . carvedilol  18.75 mg Oral BID  . colchicine  0.6 mg Oral Daily  . heparin subcutaneous  5,000 Units Subcutaneous 3 times per day  . insulin aspart  0-15 Units Subcutaneous TID WC  . insulin aspart  0-5 Units Subcutaneous QHS  .  isosorbide-hydrALAZINE  1 tablet Oral BID  . lisinopril  5 mg Oral Daily  . predniSONE  50 mg Oral Q breakfast  . torsemide  20 mg Oral BID   Continuous Infusions:  morphine injection, oxyCODONE-acetaminophen    PHYSICAL EXAM Filed Vitals:   12/29/14 2058 12/29/14 2254 12/30/14 0500 12/30/14 0545  BP: 151/97 106/70  118/92  Pulse: 79   58  Temp: 98.2 F (36.8 C)   97.8 F (36.6 C)  TempSrc: Oral   Oral  Resp:    18  Height:      Weight:   289 lb 1.6 oz (131.135 kg)   SpO2: 100%   100%   Well developed and Morbidly obese  in no acute distress HENT normal Neck supple with JVP-flat Clear Regular rate and rhythm, no murmurs or gallops Abd-soft with active BS No Clubbing cyanosis edema Skin-warm and dry A & Oriented  Grossly normal sensory and motor function  TELEMETRY: Reviewed telemetry pt in NSR with AIVR and nonsustained atrial tach   Intake/Output Summary (Last 24 hours) at 12/30/14 0802 Last data filed at 12/30/14 0100  Gross per 24 hour  Intake    960 ml  Output   4700 ml  Net  -3740 ml    LABS: Basic Metabolic Panel:  Recent Labs Lab 12/28/14 0023 12/28/14 0428  12/29/14 0522 12/30/14 0422  NA 138 138 139 139  K 4.1 4.0 4.0 4.2  CL 110 108 107 105  CO2 21 21 24 28   GLUCOSE 105* 125* 132* 116*  BUN 20 19 23  30*  CREATININE 1.67* 1.60* 1.56* 1.60*  CALCIUM 9.2 9.0 8.7 8.9  MG  --  1.6  --   --    Cardiac Enzymes:  Recent Labs  12/28/14 0428 12/28/14 0949 12/28/14 1615  TROPONINI 0.06* 0.05* 0.04*   CBC:  Recent Labs Lab 12/28/14 0023  WBC 11.8*  HGB 12.1*  HCT 37.7*  MCV 72.2*  PLT 380   PROTIME:  Recent Labs  12/28/14 0428 12/29/14 0522 12/30/14 0422  LABPROT 14.6 14.5 13.6  INR 1.12 1.11 1.03   Liver Function Tests:  Recent Labs  12/28/14 0428  AST 30  ALT 35  ALKPHOS 66  BILITOT 0.8  PROT 7.1  ALBUMIN 3.5   No results for input(s): LIPASE, AMYLASE in the last 72 hours. BNP: BNP (last 3 results)  Recent  Labs  12/28/14 0023  BNP 1066.1*    ProBNP (last 3 results)  Recent Labs  04/07/14 0018 04/18/14 1203 07/05/14 0906  PROBNP 1464.0* 72.0 163.0*    D-Dimer:  Recent Labs  12/28/14 1010  DDIMER 1.47*   Hemoglobin A1C:  Recent Labs  12/28/14 0949  HGBA1C 6.4*   Fasting Lipid Panel: No results for input(s): CHOL, HDL, LDLCALC, TRIG, CHOLHDL, LDLDIRECT in the last 72 hours. Thyroid Function Tests:  Recent Labs  12/28/14 0428  TSH 2.671   Anemia Panel: No results for input(s): VITAMINB12, FOLATE, FERRITIN, TIBC, IRON, RETICCTPCT in the last 72 hours.       ASSESSMENT AND PLAN:  Principal Problem:   Chest pain Active Problems:   Diabetes mellitus type 2, uncontrolled   Morbid obesity   OSA (obstructive sleep apnea)   Gout attack   Acute on chronic systolic CHF (congestive heart failure)   Accelerated hypertension   Elevated troponin I level   CHF (congestive heart failure), NYHA class I   Brisk diuresis overnight Do NOT think troponin elevation is consistent with ischemia] BP elevated but better  BUN  starting to bump  Cr stable, will contnue  Diuretics If discharged today will need followup at our office this week with BMET Will have office call   Signed, Virl Axe MD  12/30/2014

## 2015-01-03 ENCOUNTER — Telehealth: Payer: Self-pay | Admitting: Internal Medicine

## 2015-01-03 NOTE — Telephone Encounter (Signed)
New Message  Pt wanted to speak w/ Rn about prescription for insulin. Please call back and discuss.

## 2015-01-03 NOTE — Telephone Encounter (Signed)
Left message for patient to call back  

## 2015-01-04 NOTE — Telephone Encounter (Signed)
Calling wanting to know if we could send Rx in for his Insulin, colchicine and Bidil.  States he uses pt assistance and didn't know until this past Wed that he had to reapply for pt assistance for his meds since he was in hospital.  States he hasn't had any insulin since Monday. After reviewing his chart he does have PCP so advised him to call Dr. Doreene Burke to get Rx for insulin.  The Bidil Rx was sent to [facility] at [facility] so advised him that Rx should be there but if he wanted it transferred to [place] he would have to call them and ask if they could [facility]. He verbalizes understanding.

## 2015-01-04 NOTE — Telephone Encounter (Signed)
Follow Up ° °Pt returned call//  °

## 2015-01-22 ENCOUNTER — Encounter: Payer: Self-pay | Admitting: Nurse Practitioner

## 2015-01-22 ENCOUNTER — Other Ambulatory Visit: Payer: Self-pay

## 2015-01-22 ENCOUNTER — Ambulatory Visit (INDEPENDENT_AMBULATORY_CARE_PROVIDER_SITE_OTHER): Payer: Self-pay | Admitting: Nurse Practitioner

## 2015-01-22 VITALS — BP 112/88 | HR 72 | Ht 67.0 in | Wt 295.8 lb

## 2015-01-22 DIAGNOSIS — I5022 Chronic systolic (congestive) heart failure: Secondary | ICD-10-CM

## 2015-01-22 DIAGNOSIS — I10 Essential (primary) hypertension: Secondary | ICD-10-CM

## 2015-01-22 DIAGNOSIS — E785 Hyperlipidemia, unspecified: Secondary | ICD-10-CM

## 2015-01-22 DIAGNOSIS — I428 Other cardiomyopathies: Secondary | ICD-10-CM

## 2015-01-22 DIAGNOSIS — I429 Cardiomyopathy, unspecified: Secondary | ICD-10-CM

## 2015-01-22 LAB — BASIC METABOLIC PANEL
BUN: 24 mg/dL — AB (ref 6–23)
CHLORIDE: 106 meq/L (ref 96–112)
CO2: 27 meq/L (ref 19–32)
CREATININE: 1.68 mg/dL — AB (ref 0.40–1.50)
Calcium: 9.7 mg/dL (ref 8.4–10.5)
GFR: 58.42 mL/min — ABNORMAL LOW (ref >60.00)
Glucose, Bld: 121 mg/dL — ABNORMAL HIGH (ref 70–99)
Potassium: 3.8 mEq/L (ref 3.5–5.1)
Sodium: 137 mEq/L (ref 135–145)

## 2015-01-22 MED ORDER — HYDRALAZINE HCL 10 MG PO TABS
10.0000 mg | ORAL_TABLET | Freq: Two times a day (BID) | ORAL | Status: DC
Start: 2015-01-22 — End: 2015-05-07

## 2015-01-22 MED ORDER — ISOSORB DINITRATE-HYDRALAZINE 20-37.5 MG PO TABS: 1.0000 | ORAL_TABLET | Freq: Two times a day (BID) | ORAL | Status: DC

## 2015-01-22 MED ORDER — ISOSORBIDE MONONITRATE ER 30 MG PO TB24
30.0000 mg | ORAL_TABLET | Freq: Every day | ORAL | Status: DC
Start: 2015-01-22 — End: 2015-11-07

## 2015-01-22 NOTE — Patient Instructions (Addendum)
Medication Instructions:  Your physician has recommended you make the following change in your medication:   1) START IMDUR 30 mg by mouth daily  2) START Hydralazine 10 mg by mouth twice dialy  3) STOP Bidil  Labwork: Your physician recommends that you have labs today BMET   Testing/Procedures: NONE  Follow-Up: Your physician recommends that you schedule a follow-up appointment in: 3 to 4 weeks with CHF clinic.   Any Other Special Instructions Will Be Listed Below (If Applicable).

## 2015-01-22 NOTE — Progress Notes (Signed)
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  Past Medical History   Past Medical History  Diagnosis Date  . Hypertension   . Morbid obesity   . Presumed NICM     a. 04/2014 Myoview: EF 26%, glob HK, sev glob HK, ? prior infarct;  b. Never cathed 2/2 CKD.  [name] Chronic systolic CHF (congestive heart failure)     a. EF 20-25% in 2012. b. EF 45-50% in 10/2011 with nonischemic nuc - presumed NICM. c. 12/2014 Echo: Sev depressed LV fxn, sev dil LV, mild LVH, mild MR, sev dil LA, mildly reduced RV fxn.  . Nephrolithiasis   . OSA on CPAP   . High cholesterol   . Childhood asthma   . Type II diabetes mellitus   . Anxiety   . Renal cell carcinoma     a. s/p Rt robotic assisted partial converted to radical nephrectomy on 01/2013.  . Troponin level elevated     a. 04/2014, 12/2014: felt due to CHF.  . CKD (chronic kidney disease) stage 2, GFR 60-89 ml/min   . Aspirin allergy    Past Surgical History  Procedure Laterality Date  . Robotic assited partial nephrectomy Right 01/13/2013    Procedure: ROBOTIC ASSITED PARTIAL NEPHRECTOMY CONVERTED TO ROBOTIC ASSISTED RIGHT RADICAL NEPHRECTOMY;  Surgeon: Alexis Frock, MD;  Location: [facility];  Service: Urology;  Laterality: Right;  . Robotic assisted laparoscopic lysis of adhesion  01/13/2013    Procedure: ROBOTIC ASSISTED LAPAROSCOPIC LYSIS OF ADHESION EXTENSIVE;  Surgeon: Alexis Frock, MD;  Location: [facility];  Service: Urology;;  . Appendectomy  07/2004    Allergies  Allergies  Allergen Reactions  . Aspirin Shortness Of Breath and Rash    Rash, itching, burning sensation  (Patient reports he tolerates other NSAIDS)   . Bee Venom Hives and Swelling  . Tomato Rash    HPI   39 year old male  with the above complex problem list. He has a history of poorly controlled hypertension, obesity, and presumed nonischemic cardiomyopathy with severely depressed LV function. He was recently hospitalized at [facility] secondary to atypical chest pain, dyspnea, and mild troponin elevation. Echo during hospitalization shows severely reduced LV function. He was diuresed and was down about 7 pounds at discharge, at 289 on the hospital scale. He was placed on BiDil during hospitalization however, he has not been able to afford it and thus hasn't been taking it. He says that since his discharge, he has been feeling well. He has been walking half mile daily without dyspnea exertion. He has chronic orthopnea which is unchanged and he sleeps with a few pillows at night. He denies PND, dizziness, syncope, edema, or early satiety. He is preparing all of his own meals and avoiding salt. He weighs himself every other day and says his weight ranges from 290-95. With the exception of BiDil, he reports compliance with his cardiac medications. He has not been taking colchicine or insulin secondary to cost but has follow-up with primary care tomorrow.  Home Medications  Prior to Admission medications   Medication Sig Start Date End Date Taking? Authorizing Provider  acetaminophen (TYLENOL) 500 MG tablet Take 1,000 mg by mouth every 6 (six) hours as needed for mild pain.   Yes Historical Provider, MD  allopurinol (ZYLOPRIM) 100 MG tablet Take 1 tablet (100 mg total) by mouth daily. 12/30/14  Yes Ripudeep Krystal Eaton, MD  atorvastatin (LIPITOR) 80 MG tablet TAKE 1 TABLET (80 MG TOTAL) BY MOUTH EVERY EVENING. 10/25/14  Yes Fay Records, MD  carvedilol (COREG) 6.25 MG tablet Take 3 tablets (18.75 mg total) by mouth 2 (two) times daily. 12/30/14  Yes Ripudeep Krystal Eaton, MD  colchicine 0.6 MG tablet Take 1 tablet (0.6 mg total) by mouth daily. 12/30/14  Yes Ripudeep Krystal Eaton, MD  EPINEPHrine (EPIPEN) 0.3 mg/0.3 mL SOAJ injection Inject 0.3 mLs  (0.3 mg total) into the muscle as needed (as needed for severe allergy). 06/18/13  Yes Ezequiel Essex, MD  insulin NPH-regular Human (NOVOLIN 70/30) (70-30) 100 UNIT/ML injection Inject 34 Units into the skin 2 (two) times daily with a meal. 12/30/14  Yes Ripudeep K Rai, MD  lisinopril (PRINIVIL,ZESTRIL) 5 MG tablet Take 1 tablet (5 mg total) by mouth daily. 12/30/14  Yes Ripudeep Krystal Eaton, MD  OVER THE COUNTER MEDICATION Take 1 tablet by mouth 3 (three) times daily.   Yes Historical Provider, MD  oxyCODONE-acetaminophen (PERCOCET/ROXICET) 5-325 MG per tablet Take 1 tablet by mouth every 6 (six) hours as needed for severe pain. 12/30/14  Yes Ripudeep Krystal Eaton, MD  torsemide (DEMADEX) 20 MG tablet Take 1 tablet (20 mg total) by mouth 2 (two) times daily. 12/30/14  Yes Ripudeep Krystal Eaton, MD  isosorbide-hydrALAZINE (BIDIL) 20-37.5 MG per tablet Take 1 tablet by mouth 2 (two) times daily. 01/22/15   Fay Records, MD    Review of Systems  As above, he has been feeling well without dyspnea on exertion, PND, edema, or early satiety.  He does have chronic orthopnea. He reports compliance with medications.  All other systems reviewed and are otherwise negative except as noted above.  Physical Exam  VS:  BP 112/88 mmHg  Pulse 72  Ht 5\' 7"  (1.702 m)  Wt 295 lb 12.8 oz (134.174 kg)  BMI 46.32 kg/m2  SpO2 96% , BMI Body mass index is 46.32 kg/(m^2). GEN: Well nourished, well developed, in no acute distress. HEENT: normal. Neck: Supple, no JVD, carotid bruits, or masses. Cardiac: RRR, no murmurs, rubs, or gallops. No clubbing, cyanosis, edema.  Radials/DP/PT 2+ and equal bilaterally.  Respiratory:  Respirations regular and unlabored, clear to auscultation bilaterally. GI: Soft, nontender, nondistended, BS + x 4. MS: no deformity or atrophy. Skin: warm and dry, no rash. Neuro:  Strength and sensation are intact. Psych: Normal affect.  Accessory Clinical Findings  Basic metabolic panel is pending  Assessment &  Plan  1.  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 We discussed today the importance of daily weights in order to avoid dramatic weight gains and volume overload. His weights have been somewhat variable, between 290 and 295 pounds though he reports no significant symptoms or limitations in activity. He has been compliant with his beta blocker and ACE inhibitor however was not able to afford BiDil. As result, we will split this into isosorbide mononitrate 30 mg daily and hydralazine 10 mg twice a day. I went with a lower dose of hydralazine as his blood pressure is only 112/88 today. Continue current dose of torsemide and check basic metabolic panel today. We did discuss the importance of medication and lifestyle compliance as well as follow-up and the possibility of needing a defibrillator once he can improve sustained compliance in the future.  We also discussed referral to the heart failure program which he is interested in.  2. Hypertension: Currently stable. He will remain on beta blocker and ACE inhibitor and we will resume at a lower dose of hydralazine and nitrates.  3. Stage II chronic kidney disease: Follow-up basic metabolic panel today.  4. Type 2 diabetes mellitus: Patient is follow-up primary care tomorrow in order to obtain refill prescription on his insulin.   5. Hyperlipidemia: Continue statin therapy. LFTs were within normal limits in March.  6. Morbid obesity: We discussed the importance of healthy eating, exercise, and weight loss.  7. Disposition: Follow-up basic metabolic panel today. We will arrange for referral to the CHF clinic in 3-4 weeks.   Murray Hodgkins, NP 01/22/2015, 11:20 AM

## 2015-02-19 ENCOUNTER — Encounter (HOSPITAL_COMMUNITY): Payer: Self-pay

## 2015-03-11 ENCOUNTER — Ambulatory Visit (HOSPITAL_COMMUNITY)
Admission: RE | Admit: 2015-03-11 | Discharge: 2015-03-11 | Disposition: A | Discharge status: Home / Self Care | Payer: Self-pay | Source: Ambulatory Visit | Attending: Internal Medicine | Admitting: Internal Medicine

## 2015-03-11 ENCOUNTER — Encounter (HOSPITAL_COMMUNITY): Payer: Self-pay

## 2015-03-11 VITALS — BP 144/108 | HR 91 | Ht 67.0 in | Wt 298.0 lb

## 2015-03-11 DIAGNOSIS — G4733 Obstructive sleep apnea (adult) (pediatric): Secondary | ICD-10-CM | POA: Insufficient documentation

## 2015-03-11 DIAGNOSIS — M10069 Idiopathic gout, unspecified knee: Secondary | ICD-10-CM

## 2015-03-11 DIAGNOSIS — F419 Anxiety disorder, unspecified: Secondary | ICD-10-CM | POA: Insufficient documentation

## 2015-03-11 DIAGNOSIS — E780 Pure hypercholesterolemia: Secondary | ICD-10-CM | POA: Insufficient documentation

## 2015-03-11 DIAGNOSIS — N182 Chronic kidney disease, stage 2 (mild): Secondary | ICD-10-CM | POA: Insufficient documentation

## 2015-03-11 DIAGNOSIS — I5022 Chronic systolic (congestive) heart failure: Secondary | ICD-10-CM | POA: Insufficient documentation

## 2015-03-11 DIAGNOSIS — I129 Hypertensive chronic kidney disease with stage 1 through stage 4 chronic kidney disease, or unspecified chronic kidney disease: Secondary | ICD-10-CM | POA: Insufficient documentation

## 2015-03-11 DIAGNOSIS — M109 Gout, unspecified: Secondary | ICD-10-CM | POA: Insufficient documentation

## 2015-03-11 DIAGNOSIS — F17210 Nicotine dependence, cigarettes, uncomplicated: Secondary | ICD-10-CM | POA: Insufficient documentation

## 2015-03-11 DIAGNOSIS — Z6841 Body Mass Index (BMI) 40.0 and over, adult: Secondary | ICD-10-CM | POA: Insufficient documentation

## 2015-03-11 DIAGNOSIS — E119 Type 2 diabetes mellitus without complications: Secondary | ICD-10-CM | POA: Insufficient documentation

## 2015-03-11 DIAGNOSIS — I10 Essential (primary) hypertension: Secondary | ICD-10-CM

## 2015-03-11 DIAGNOSIS — Z79899 Other long term (current) drug therapy: Secondary | ICD-10-CM | POA: Insufficient documentation

## 2015-03-11 DIAGNOSIS — I5023 Acute on chronic systolic (congestive) heart failure: Secondary | ICD-10-CM

## 2015-03-11 DIAGNOSIS — Z794 Long term (current) use of insulin: Secondary | ICD-10-CM | POA: Insufficient documentation

## 2015-03-11 DIAGNOSIS — I429 Cardiomyopathy, unspecified: Secondary | ICD-10-CM | POA: Insufficient documentation

## 2015-03-11 LAB — BASIC METABOLIC PANEL
Anion gap: 8 (ref 5–15)
BUN: 20 mg/dL (ref 6–20)
CO2: 24 mmol/L (ref 22–32)
Calcium: 9.5 mg/dL (ref 8.9–10.3)
Chloride: 107 mmol/L (ref 101–111)
Creatinine, Ser: 1.52 mg/dL — ABNORMAL HIGH (ref 0.61–1.24)
GFR calc Af Amer: >60 mL/min (ref >60)
GFR calc non Af Amer: 56 mL/min — ABNORMAL LOW (ref >60)
Glucose, Bld: 127 mg/dL — ABNORMAL HIGH (ref 65–99)
Potassium: 4.1 mmol/L (ref 3.5–5.1)
Sodium: 139 mmol/L (ref 135–145)

## 2015-03-11 LAB — BRAIN NATRIURETIC PEPTIDE: B Natriuretic Peptide: 133.4 pg/mL — ABNORMAL HIGH (ref 0.0–100.0)

## 2015-03-11 MED ORDER — LISINOPRIL 10 MG PO TABS: 10.0000 mg | ORAL_TABLET | Freq: Every day | ORAL | Status: DC

## 2015-03-11 MED ORDER — ALLOPURINOL 100 MG PO TABS: 200.0000 mg | ORAL_TABLET | Freq: Every day | ORAL | Status: DC

## 2015-03-11 NOTE — Patient Instructions (Signed)
Increase Allopurinol to 200 mg (2 tabs) daily  Increase Lisinopril to 10 mg daily, you can take 2 of your 5 mg tablets until you run out, then we have sent you in a new prescription for 10 mg strength tablets  Labs today  Your physician has requested that you have an echocardiogram. Echocardiography is a painless test that uses sound waves to create images of your heart. It provides your doctor with information about the size and shape of your heart and how well your heart's chambers and valves are working. This procedure takes approximately one hour. There are no restrictions for this procedure.  Your physician recommends that you schedule a follow-up appointment in: 1 month  Please get your CPAP fixed

## 2015-03-11 NOTE — Progress Notes (Signed)
Patient ID: Jeffrey [name], male   DOB: 1976/08/03, 39 y.o.   MRN: [id number]   Patient Name: Jeffrey [name] Date of Encounter: 03/11/2015  Primary Care Provider:  Angelica Chessman, MD Primary Cardiologist:  Lizbeth Bark, MD   History:  Ms. 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   Echo in 2012 with EF 20-25%.  In 1/13 had nuclear study EF 45-50% suggestive of NICM.    Admitted to [facility] secondary to atypical chest pain, dyspnea, and mild troponin elevation. Echo during hospitalization shows "severely reduced LV function" - EF nt quantified due to poor windows. He was diuresed and was down about 7 pounds at discharge, at 289 on the hospital scale.   He was seen by Ignacia Bayley, NP-C in April and doing well. Was off Bidil due to cost so was started on hydralazine and Imdur.   Says he feels pretty good.  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 Has been compliant with meds and watching his diet and sodium intake. That said, missed his meds last night and this am. Wife watches him closely. Says he has been real active and feels great. Walks 1-1.5 miles every morning and last lap is a jog. No SOB or CP. Doing push-ups and sit-ups.  He denies PND, dizziness, syncope, edema, or early satiety. Says BP tends to run high but not as high as it is today. Doesn't wear CPAP regularly (needs new filter). Gets gout frequently. No taking allopurinol.   Past Medical History   Past Medical History  Diagnosis Date  . Hypertension   . Morbid obesity   . Presumed NICM     a. 04/2014 Myoview: EF 26%, glob HK, sev glob HK, ? prior infarct;  b. Never cathed 2/2 CKD.  [name] Chronic systolic CHF (congestive heart failure)     a. EF 20-25% in 2012. b. EF 45-50% in 10/2011 with nonischemic nuc - presumed NICM. c. 12/2014 Echo: Sev depressed LV  fxn, sev dil LV, mild LVH, mild MR, sev dil LA, mildly reduced RV fxn.  . Nephrolithiasis   . OSA on CPAP   . High cholesterol   . Childhood asthma   . Type II diabetes mellitus   . Anxiety   . Renal cell carcinoma     a. s/p Rt robotic assisted partial converted to radical nephrectomy on 01/2013.  . Troponin level elevated     a. 04/2014, 12/2014: felt due to CHF.  . CKD (chronic kidney disease) stage 2, GFR 60-89 ml/min   . Aspirin allergy    Past Surgical History  Procedure Laterality Date  . Robotic assited partial nephrectomy Right 01/13/2013    Procedure: ROBOTIC ASSITED PARTIAL NEPHRECTOMY CONVERTED TO ROBOTIC ASSISTED RIGHT RADICAL NEPHRECTOMY;  Surgeon: Alexis Frock, MD;  Location: [facility];  Service: Urology;  Laterality: Right;  . Robotic assisted laparoscopic lysis of adhesion  01/13/2013    Procedure: ROBOTIC ASSISTED LAPAROSCOPIC LYSIS OF ADHESION EXTENSIVE;  Surgeon: Alexis Frock, MD;  Location: [facility];  Service: Urology;;  . Appendectomy  07/2004    Allergies  Allergies  Allergen Reactions  . Aspirin Shortness Of Breath and Rash    Rash, itching, burning sensation  (Patient reports he tolerates other NSAIDS)   . Bee Venom Hives and Swelling  . Tomato Rash  Home Medications  Prior to Admission medications   Medication Sig Start Date End Date Taking? Authorizing Provider  acetaminophen (TYLENOL) 500 MG tablet Take 1,000 mg by mouth every 6 (six) hours as needed for mild pain.   Yes Historical Provider, MD  allopurinol (ZYLOPRIM) 100 MG tablet Take 1 tablet (100 mg total) by mouth daily. 12/30/14  Yes Ripudeep Krystal Eaton, MD  atorvastatin (LIPITOR) 80 MG tablet TAKE 1 TABLET (80 MG TOTAL) BY MOUTH EVERY EVENING. 10/25/14  Yes Fay Records, MD  carvedilol (COREG) 6.25 MG tablet Take 3 tablets (18.75 mg total) by mouth 2 (two) times daily. 12/30/14  Yes Ripudeep Krystal Eaton, MD  colchicine 0.6 MG tablet Take 1 tablet (0.6 mg total) by mouth daily. 12/30/14  Yes Ripudeep Krystal Eaton,  MD  EPINEPHrine (EPIPEN) 0.3 mg/0.3 mL SOAJ injection Inject 0.3 mLs (0.3 mg total) into the muscle as needed (as needed for severe allergy). 06/18/13  Yes Ezequiel Essex, MD  insulin NPH-regular Human (NOVOLIN 70/30) (70-30) 100 UNIT/ML injection Inject 34 Units into the skin 2 (two) times daily with a meal. 12/30/14  Yes Ripudeep K Rai, MD  lisinopril (PRINIVIL,ZESTRIL) 5 MG tablet Take 1 tablet (5 mg total) by mouth daily. 12/30/14  Yes Ripudeep Krystal Eaton, MD  OVER THE COUNTER MEDICATION Take 1 tablet by mouth 3 (three) times daily.   Yes Historical Provider, MD  oxyCODONE-acetaminophen (PERCOCET/ROXICET) 5-325 MG per tablet Take 1 tablet by mouth every 6 (six) hours as needed for severe pain. 12/30/14  Yes Ripudeep Krystal Eaton, MD  torsemide (DEMADEX) 20 MG tablet Take 1 tablet (20 mg total) by mouth 2 (two) times daily. 12/30/14  Yes Ripudeep Krystal Eaton, MD  isosorbide-hydrALAZINE (BIDIL) 20-37.5 MG per tablet Take 1 tablet by mouth 2 (two) times daily. 01/22/15   Fay Records, MD    Review of Systems  As above, he has been feeling well without dyspnea on exertion, PND, edema, or early satiety.  He does have chronic orthopnea. He reports compliance with medications.  All other systems reviewed and are otherwise negative except as noted above.  Physical Exam  VS:  BP 144/108 mmHg  Pulse 91  Ht 5\' 7"  (1.702 m)  Wt 298 lb (135.172 kg)  BMI 46.66 kg/m2  SpO2 98% , BMI Body mass index is 46.66 kg/(m^2). GEN: Well nourished, well developed, in no acute distress. HEENT: normal. Neck: Supple, no JVD, carotid bruits, or masses. Cardiac: RRR, no murmurs, rubs, or gallops. No clubbing, cyanosis, edema.  Radials/DP/PT 2+ and equal bilaterally.  Respiratory:  Respirations regular and unlabored, clear to auscultation bilaterally. GI: Obese, soft, nontender, nondistended, BS + x 4. MS: no deformity or atrophy. Skin: warm and dry, no rash. Neuro:  Strength and sensation are intact. Psych: Normal affect.  Accessory  Clinical Findings  Basic metabolic panel is pending   Assessment & Plan  1.  Chronic systolic congestive heart failure/presumed nonischemic cardiomyopathy - suspect due to HTN or OSA:   - doing well NYHA I. Volume status looks good - stressed need to be fully compliant with meds and CPAP - increased lisinopril to 10 mg daily - will not increase carvedilol yet as HR was previously in 85s. Can increase at next visit if HR remains up after he has taken all meds - continue hydral/Imdur - consider spiro at some point - Will repeat echo to look for LV recovery  2. Hypertension: Elevated in setting of not taking meds for 24 hours.   3.  Stage II chronic kidney disease: Follow-up basic metabolic panel today.  4. OSA: stressed need to be compliant with CPAP  5. Tobacco use: smoking 3-4 cigarettes per day. Counseled on cessation.   6. Gout: Start allopurinol 200 daily for prophylaxis.   Glori Bickers, MD 12:35 PM

## 2015-04-01 ENCOUNTER — Other Ambulatory Visit: Payer: Self-pay

## 2015-04-07 ENCOUNTER — Emergency Department (HOSPITAL_COMMUNITY)
Admission: EM | Admit: 2015-04-07 | Discharge: 2015-04-07 | Disposition: A | Discharge status: Home / Self Care | Payer: Self-pay | Attending: Emergency Medicine | Admitting: Emergency Medicine

## 2015-04-07 ENCOUNTER — Encounter (HOSPITAL_COMMUNITY): Payer: Self-pay

## 2015-04-07 DIAGNOSIS — Z720 Tobacco use: Secondary | ICD-10-CM | POA: Insufficient documentation

## 2015-04-07 DIAGNOSIS — Y288XXA Contact with other sharp object, undetermined intent, initial encounter: Secondary | ICD-10-CM | POA: Insufficient documentation

## 2015-04-07 DIAGNOSIS — Z794 Long term (current) use of insulin: Secondary | ICD-10-CM | POA: Insufficient documentation

## 2015-04-07 DIAGNOSIS — Z85528 Personal history of other malignant neoplasm of kidney: Secondary | ICD-10-CM | POA: Insufficient documentation

## 2015-04-07 DIAGNOSIS — N182 Chronic kidney disease, stage 2 (mild): Secondary | ICD-10-CM | POA: Insufficient documentation

## 2015-04-07 DIAGNOSIS — I129 Hypertensive chronic kidney disease with stage 1 through stage 4 chronic kidney disease, or unspecified chronic kidney disease: Secondary | ICD-10-CM | POA: Insufficient documentation

## 2015-04-07 DIAGNOSIS — Z9981 Dependence on supplemental oxygen: Secondary | ICD-10-CM | POA: Insufficient documentation

## 2015-04-07 DIAGNOSIS — G4733 Obstructive sleep apnea (adult) (pediatric): Secondary | ICD-10-CM | POA: Insufficient documentation

## 2015-04-07 DIAGNOSIS — E780 Pure hypercholesterolemia: Secondary | ICD-10-CM | POA: Insufficient documentation

## 2015-04-07 DIAGNOSIS — J45909 Unspecified asthma, uncomplicated: Secondary | ICD-10-CM | POA: Insufficient documentation

## 2015-04-07 DIAGNOSIS — Z79899 Other long term (current) drug therapy: Secondary | ICD-10-CM | POA: Insufficient documentation

## 2015-04-07 DIAGNOSIS — Z8659 Personal history of other mental and behavioral disorders: Secondary | ICD-10-CM | POA: Insufficient documentation

## 2015-04-07 DIAGNOSIS — E119 Type 2 diabetes mellitus without complications: Secondary | ICD-10-CM | POA: Insufficient documentation

## 2015-04-07 DIAGNOSIS — Z87448 Personal history of other diseases of urinary system: Secondary | ICD-10-CM | POA: Insufficient documentation

## 2015-04-07 DIAGNOSIS — Y990 Civilian activity done for income or pay: Secondary | ICD-10-CM | POA: Insufficient documentation

## 2015-04-07 DIAGNOSIS — I5022 Chronic systolic (congestive) heart failure: Secondary | ICD-10-CM | POA: Insufficient documentation

## 2015-04-07 DIAGNOSIS — Y9389 Activity, other specified: Secondary | ICD-10-CM | POA: Insufficient documentation

## 2015-04-07 DIAGNOSIS — Y9289 Other specified places as the place of occurrence of the external cause: Secondary | ICD-10-CM | POA: Insufficient documentation

## 2015-04-07 DIAGNOSIS — S61219A Laceration without foreign body of unspecified finger without damage to nail, initial encounter: Secondary | ICD-10-CM

## 2015-04-07 DIAGNOSIS — S61212A Laceration without foreign body of right middle finger without damage to nail, initial encounter: Secondary | ICD-10-CM | POA: Insufficient documentation

## 2015-04-07 DIAGNOSIS — Z23 Encounter for immunization: Secondary | ICD-10-CM | POA: Insufficient documentation

## 2015-04-07 MED ORDER — TETANUS-DIPHTH-ACELL PERTUSSIS 5-2.5-18.5 LF-MCG/0.5 IM SUSP
0.5000 mL | Freq: Once | INTRAMUSCULAR | Status: AC
  Administered 2015-04-07: 0.5 mL via INTRAMUSCULAR
  Filled 2015-04-07: qty 0.5

## 2015-04-07 MED ORDER — BACITRACIN 500 UNIT/GM EX OINT
1.0000 "application " | TOPICAL_OINTMENT | Freq: Two times a day (BID) | CUTANEOUS | Status: DC
  Administered 2015-04-07: 1 via TOPICAL
  Filled 2015-04-07 (×2): qty 0.9

## 2015-04-07 MED ORDER — LIDOCAINE HCL (PF) 1 % IJ SOLN
20.0000 mL | Freq: Once | INTRAMUSCULAR | Status: AC
  Administered 2015-04-07: 20 mL via INTRADERMAL
  Filled 2015-04-07: qty 20

## 2015-04-07 MED ORDER — OXYCODONE-ACETAMINOPHEN 5-325 MG PO TABS
2.0000 | ORAL_TABLET | Freq: Once | ORAL | Status: AC
  Administered 2015-04-07: 2 via ORAL
  Filled 2015-04-07: qty 2

## 2015-04-07 NOTE — ED Provider Notes (Signed)
CSN: [id number]     Arrival date & time 04/07/15  1447 History  This chart was scribed for Ottie Glazier, PA-C, working with Alfonzo Beers, MD by Steva Colder, ED Scribe. The patient was seen in room WTR6/WTR6 at 3:10 PM.    Chief Complaint  Patient presents with  . Finger Laceration       The history is provided by the patient. No language interpreter was used.    HPI Comments: Jeffrey [name] is a 39 y.o. male with a medical hx of HTN, CHF, and type II DM, who presents to the Emergency Department complaining of right middle finger laceration onset 2:30 PM PTA. Pt notes that he cut his finger on a bowl that broke while at work at [employer]. He states that he has not tried any medications for the relief of his symptoms. He denies fever, chills, and any other symptoms. He is unaware of tetanus status.   Past Medical History  Diagnosis Date  . Hypertension   . Morbid obesity   . Presumed NICM     a. 04/2014 Myoview: EF 26%, glob HK, sev glob HK, ? prior infarct;  b. Never cathed 2/2 CKD.  [name] Chronic systolic CHF (congestive heart failure)     a. EF 20-25% in 2012. b. EF 45-50% in 10/2011 with nonischemic nuc - presumed NICM. c. 12/2014 Echo: Sev depressed LV fxn, sev dil LV, mild LVH, mild MR, sev dil LA, mildly reduced RV fxn.  . Nephrolithiasis   . OSA on CPAP   . High cholesterol   . Childhood asthma   . Type II diabetes mellitus   . Anxiety   . Renal cell carcinoma     a. s/p Rt robotic assisted partial converted to radical nephrectomy on 01/2013.  . Troponin level elevated     a. 04/2014, 12/2014: felt due to CHF.  . CKD (chronic kidney disease) stage 2, GFR 60-89 ml/min   . Aspirin allergy    Past Surgical History  Procedure Laterality Date  . Robotic assited partial nephrectomy Right 01/13/2013    Procedure: ROBOTIC ASSITED PARTIAL NEPHRECTOMY CONVERTED TO ROBOTIC ASSISTED RIGHT RADICAL NEPHRECTOMY;  Surgeon: Alexis Frock, MD;  Location: [facility];  Service: Urology;   Laterality: Right;  . Robotic assisted laparoscopic lysis of adhesion  01/13/2013    Procedure: ROBOTIC ASSISTED LAPAROSCOPIC LYSIS OF ADHESION EXTENSIVE;  Surgeon: Alexis Frock, MD;  Location: [facility];  Service: Urology;;  . Appendectomy  07/2004   Family History  Problem Relation Age of Onset  . Diabetes Other   . Hypertension Other   . Heart attack Neg Hx   . Stroke Neg Hx   . Hypertension Mother   . Diabetes Maternal Aunt    History  Substance Use Topics  . Smoking status: Light Tobacco Smoker -- 0.25 packs/day for 22 years    Types: Cigarettes    Last Attempt to Quit: 03/27/2014  . Smokeless tobacco: Never Used  . Alcohol Use: 9.6 oz/week    16 Shots of liquor per week     Comment: 04/05/2014 "1/2 pint/weekend nite; total ~ 1 1/2 pint brandy"    Review of Systems  Constitutional: Negative for fever and chills.  Musculoskeletal: Negative for joint swelling.  Skin: Positive for wound (laceration to right middle finger). Negative for color change.      Allergies  Aspirin; Bee venom; and Tomato  Home Medications   Prior to Admission medications   Medication Sig Start Date End Date  Taking? Authorizing Provider  acetaminophen (TYLENOL) 500 MG tablet Take 1,000 mg by mouth every 6 (six) hours as needed for mild pain.    Historical Provider, MD  allopurinol (ZYLOPRIM) 100 MG tablet Take 2 tablets (200 mg total) by mouth daily. 03/11/15   Jolaine Artist, MD  atorvastatin (LIPITOR) 80 MG tablet TAKE 1 TABLET (80 MG TOTAL) BY MOUTH EVERY EVENING. 10/25/14   Fay Records, MD  carvedilol (COREG) 6.25 MG tablet Take 3 tablets (18.75 mg total) by mouth 2 (two) times daily. 12/30/14   Ripudeep Krystal Eaton, MD  colchicine 0.6 MG tablet Take 1 tablet (0.6 mg total) by mouth daily. 12/30/14   Ripudeep Krystal Eaton, MD  EPINEPHrine (EPIPEN) 0.3 mg/0.3 mL SOAJ injection Inject 0.3 mLs (0.3 mg total) into the muscle as needed (as needed for severe allergy). 06/18/13   Ezequiel Essex, MD  hydrALAZINE  (APRESOLINE) 10 MG tablet Take 1 tablet (10 mg total) by mouth 2 (two) times daily at 10 AM and 5 PM. 01/22/15   Rogelia Mire, NP  insulin NPH-regular Human (NOVOLIN 70/30) (70-30) 100 UNIT/ML injection Inject 34 Units into the skin 2 (two) times daily with a meal. 12/30/14   Ripudeep Krystal Eaton, MD  isosorbide mononitrate (IMDUR) 30 MG 24 hr tablet Take 1 tablet (30 mg total) by mouth daily. 01/22/15   Rogelia Mire, NP  lisinopril (PRINIVIL,ZESTRIL) 10 MG tablet Take 1 tablet (10 mg total) by mouth daily. 03/11/15   Jolaine Artist, MD  OVER THE COUNTER MEDICATION Take 1 tablet by mouth 3 (three) times daily.    Historical Provider, MD  oxyCODONE-acetaminophen (PERCOCET/ROXICET) 5-325 MG per tablet Take 1 tablet by mouth every 6 (six) hours as needed for severe pain. 12/30/14   Ripudeep Krystal Eaton, MD  torsemide (DEMADEX) 20 MG tablet Take 1 tablet (20 mg total) by mouth 2 (two) times daily. 12/30/14   Ripudeep K Rai, MD   BP 142/98 mmHg  Pulse 85  Temp(Src) 98.3 F (36.8 C)  Resp 14  SpO2 98% Physical Exam  Constitutional: He is oriented to person, place, and time. He appears well-developed and well-nourished. No distress.  Obese.  HENT:  Head: Normocephalic and atraumatic.  Eyes: EOM are normal.  Neck: Neck supple. No tracheal deviation present.  Cardiovascular: Normal rate.   Pulmonary/Chest: Effort normal. No respiratory distress.  Musculoskeletal: Normal range of motion.  Neurological: He is alert and oriented to person, place, and time.  Skin: Skin is warm and dry. Laceration noted.  1 cm laceration to volar surface of the middle right finger over the middle phalynx. Able to flex and extend all finger. Good radial pulse. Good cap refill less than 2 seconds. NVI.  Psychiatric: He has a normal mood and affect. His behavior is normal.  Nursing note and vitals reviewed.   ED Course  Procedures (including critical care time) DIAGNOSTIC STUDIES: Oxygen Saturation is 99% on RA, nl  by my interpretation.   LACERATION REPAIR Performed by: Ottie Glazier Authorized by: Ottie Glazier Consent: Verbal consent obtained. Risks and benefits: risks, benefits and alternatives were discussed Consent given by: patient Patient identity confirmed: provided demographic data Prepped and Draped in normal sterile fashion Wound explored Laceration Location: Right middle finger Laceration Length: 1.5cm No Foreign Bodies seen or palpated Anesthesia: local infiltration Local anesthetic: lidocaine 1% without epinephrine Anesthetic total: 6 ml Irrigation method: syringe Amount of cleaning: standard Skin closure: 4-0 Prolene Number of sutures: 3 Technique: simple interupted Patient tolerance: Patient  tolerated the procedure well with no immediate complications. Bacitracin was placed. Finger splint was placed.   COORDINATION OF CARE: 3:11 PM-Discussed treatment plan which includes tetanus updated and laceration repair with pt at bedside and pt agreed to plan.   Labs Review Labs Reviewed - No data to display  Imaging Review No results found.   EKG Interpretation None      MDM   Final diagnoses:  Finger laceration, initial encounter   Vitals are stable. Laceration is not deep so I did not obtain an x-ray. I discussed that the sutures should be removed in 7-10 days. The patient was placed in a finger splint. I also wrote a work note until Friday since he is a Training and development officer at [employer]. He can take ibuprofen or Tylenol for pain. Patient verbally agrees with the plan. Medications  bacitracin ointment 1 application (1 application Topical Given 04/07/15 1651)  lidocaine (PF) (XYLOCAINE) 1 % injection 20 mL (20 mLs Intradermal Given 04/07/15 1539)  Tdap (BOOSTRIX) injection 0.5 mL (0.5 mLs Intramuscular Given 04/07/15 1540)  oxyCODONE-acetaminophen (PERCOCET/ROXICET) 5-325 MG per tablet 2 tablet (2 tablets Oral Given 04/07/15 1651)  I personally performed the services described in  this documentation, which was scribed in my presence. The recorded information has been reviewed and is accurate.    Ottie Glazier, PA-C 04/07/15 1817  Alfonzo Beers, MD 04/07/15 [phone]

## 2015-04-07 NOTE — ED Notes (Signed)
Pt c/o laceration on R anterior middle digit after "a bowl broke at work."  Pain score 10/10.   Bleeding is controlled.  Unknown last tetanus shot.

## 2015-04-07 NOTE — Discharge Instructions (Signed)
Laceration Care, Adult Take ibuprofen or Motrin for pain. Have sutures removed in 7-10 days. A laceration is a cut or lesion that goes through all layers of the skin and into the tissue just beneath the skin. TREATMENT  Some lacerations may not require closure. Some lacerations may not be able to be closed due to an increased risk of infection. It is important to see your caregiver as soon as possible after an injury to minimize the risk of infection and maximize the opportunity for successful closure. If closure is appropriate, pain medicines may be given, if needed. The wound will be cleaned to help prevent infection. Your caregiver will use stitches (sutures), staples, wound glue (adhesive), or skin adhesive strips to repair the laceration. These tools bring the skin edges together to allow for faster healing and a better cosmetic outcome. However, all wounds will heal with a scar. Once the wound has healed, scarring can be minimized by covering the wound with sunscreen during the day for 1 full year. HOME CARE INSTRUCTIONS  For sutures or staples:  Keep the wound clean and dry.  If you were given a bandage (dressing), you should change it at least once a day. Also, change the dressing if it becomes wet or dirty, or as directed by your caregiver.  Wash the wound with soap and water 2 times a day. Rinse the wound off with water to remove all soap. Pat the wound dry with a clean towel.  After cleaning, apply a thin layer of the antibiotic ointment as recommended by your caregiver. This will help prevent infection and keep the dressing from sticking.  You may shower as usual after the first 24 hours. Do not soak the wound in water until the sutures are removed.  Only take over-the-counter or prescription medicines for pain, discomfort, or fever as directed by your caregiver.  Get your sutures or staples removed as directed by your caregiver. For skin adhesive strips:  Keep the wound clean and  dry.  Do not get the skin adhesive strips wet. You may bathe carefully, using caution to keep the wound dry.  If the wound gets wet, pat it dry with a clean towel.  Skin adhesive strips will fall off on their own. You may trim the strips as the wound heals. Do not remove skin adhesive strips that are still stuck to the wound. They will fall off in time. For wound adhesive:  You may briefly wet your wound in the shower or bath. Do not soak or scrub the wound. Do not swim. Avoid periods of heavy perspiration until the skin adhesive has fallen off on its own. After showering or bathing, gently pat the wound dry with a clean towel.  Do not apply liquid medicine, cream medicine, or ointment medicine to your wound while the skin adhesive is in place. This may loosen the film before your wound is healed.  If a dressing is placed over the wound, be careful not to apply tape directly over the skin adhesive. This may cause the adhesive to be pulled off before the wound is healed.  Avoid prolonged exposure to sunlight or tanning lamps while the skin adhesive is in place. Exposure to ultraviolet light in the first year will darken the scar.  The skin adhesive will usually remain in place for 5 to 10 days, then naturally fall off the skin. Do not pick at the adhesive film. You may need a tetanus shot if:  You cannot remember when you  had your last tetanus shot.  You have never had a tetanus shot. If you get a tetanus shot, your arm may swell, get red, and feel warm to the touch. This is common and not a problem. If you need a tetanus shot and you choose not to have one, there is a rare chance of getting tetanus. Sickness from tetanus can be serious. SEEK MEDICAL CARE IF:   You have redness, swelling, or increasing pain in the wound.  You see a red line that goes away from the wound.  You have yellowish-white fluid (pus) coming from the wound.  You have a fever.  You notice a bad smell coming  from the wound or dressing.  Your wound breaks open before or after sutures have been removed.  You notice something coming out of the wound such as wood or glass.  Your wound is on your hand or foot and you cannot move a finger or toe. SEEK IMMEDIATE MEDICAL CARE IF:   Your pain is not controlled with prescribed medicine.  You have severe swelling around the wound causing pain and numbness or a change in color in your arm, hand, leg, or foot.  Your wound splits open and starts bleeding.  You have worsening numbness, weakness, or loss of function of any joint around or beyond the wound.  You develop painful lumps near the wound or on the skin anywhere on your body. MAKE SURE YOU:   Understand these instructions.  Will watch your condition.  Will get help right away if you are not doing well or get worse. Document Released: 09/21/2005 Document Revised: 12/14/2011 Document Reviewed: 03/17/2011 [facility] Patient Information 2015 [place], [place]. This information is not intended to replace advice given to you by your health care provider. Make sure you discuss any questions you have with your health care provider.

## 2015-04-09 ENCOUNTER — Ambulatory Visit (HOSPITAL_COMMUNITY): Payer: Self-pay

## 2015-04-09 ENCOUNTER — Encounter (HOSPITAL_COMMUNITY): Payer: Self-pay

## 2015-04-17 ENCOUNTER — Emergency Department (HOSPITAL_COMMUNITY)
Admission: EM | Admit: 2015-04-17 | Discharge: 2015-04-17 | Disposition: A | Discharge status: Home / Self Care | Payer: Self-pay | Attending: Emergency Medicine | Admitting: Emergency Medicine

## 2015-04-17 ENCOUNTER — Encounter (HOSPITAL_COMMUNITY): Payer: Self-pay

## 2015-04-17 DIAGNOSIS — G4733 Obstructive sleep apnea (adult) (pediatric): Secondary | ICD-10-CM | POA: Insufficient documentation

## 2015-04-17 DIAGNOSIS — E119 Type 2 diabetes mellitus without complications: Secondary | ICD-10-CM | POA: Insufficient documentation

## 2015-04-17 DIAGNOSIS — Z794 Long term (current) use of insulin: Secondary | ICD-10-CM | POA: Insufficient documentation

## 2015-04-17 DIAGNOSIS — E780 Pure hypercholesterolemia: Secondary | ICD-10-CM | POA: Insufficient documentation

## 2015-04-17 DIAGNOSIS — F419 Anxiety disorder, unspecified: Secondary | ICD-10-CM | POA: Insufficient documentation

## 2015-04-17 DIAGNOSIS — Z9981 Dependence on supplemental oxygen: Secondary | ICD-10-CM | POA: Insufficient documentation

## 2015-04-17 DIAGNOSIS — Z720 Tobacco use: Secondary | ICD-10-CM | POA: Insufficient documentation

## 2015-04-17 DIAGNOSIS — Z79899 Other long term (current) drug therapy: Secondary | ICD-10-CM | POA: Insufficient documentation

## 2015-04-17 DIAGNOSIS — J45909 Unspecified asthma, uncomplicated: Secondary | ICD-10-CM | POA: Insufficient documentation

## 2015-04-17 DIAGNOSIS — Z87442 Personal history of urinary calculi: Secondary | ICD-10-CM | POA: Insufficient documentation

## 2015-04-17 DIAGNOSIS — Z4802 Encounter for removal of sutures: Secondary | ICD-10-CM | POA: Insufficient documentation

## 2015-04-17 DIAGNOSIS — I129 Hypertensive chronic kidney disease with stage 1 through stage 4 chronic kidney disease, or unspecified chronic kidney disease: Secondary | ICD-10-CM | POA: Insufficient documentation

## 2015-04-17 DIAGNOSIS — Z85528 Personal history of other malignant neoplasm of kidney: Secondary | ICD-10-CM | POA: Insufficient documentation

## 2015-04-17 DIAGNOSIS — I5022 Chronic systolic (congestive) heart failure: Secondary | ICD-10-CM | POA: Insufficient documentation

## 2015-04-17 DIAGNOSIS — N182 Chronic kidney disease, stage 2 (mild): Secondary | ICD-10-CM | POA: Insufficient documentation

## 2015-04-17 NOTE — Discharge Instructions (Signed)
Suture Removal, Care After Refer to this sheet in the next few weeks. These instructions provide you with information on caring for yourself after your procedure. Your health care provider may also give you more specific instructions. Your treatment has been planned according to current medical practices, but problems sometimes occur. Call your health care provider if you have any problems or questions after your procedure. WHAT TO EXPECT AFTER THE PROCEDURE After your stitches (sutures) are removed, it is typical to have the following:  Some discomfort and swelling in the wound area.  Slight redness in the area. HOME CARE INSTRUCTIONS   If you have skin adhesive strips over the wound area, do not take the strips off. They will fall off on their own in a few days. If the strips remain in place after 14 days, you may remove them.  Change any bandages (dressings) at least once a day or as directed by your health care provider. If the bandage sticks, soak it off with warm, soapy water.  Apply cream or ointment only as directed by your health care provider. If using cream or ointment, wash the area with soap and water 2 times a day to remove all the cream or ointment. Rinse off the soap and pat the area dry with a clean towel.  Keep the wound area dry and clean. If the bandage becomes wet or dirty, or if it develops a bad smell, change it as soon as possible.  Continue to protect the wound from injury.  Use sunscreen when out in the sun. New scars become sunburned easily. SEEK MEDICAL CARE IF:  You have increasing redness, swelling, or pain in the wound.  You see pus coming from the wound.  You have a fever.  You notice a bad smell coming from the wound or dressing.  Your wound breaks open (edges not staying together). Document Released: 06/16/2001 Document Revised: 07/12/2013 Document Reviewed: 05/03/2013 [facility] Patient Information 2015 [place], [place]. This information is not  intended to replace advice given to you by your health care provider. Make sure you discuss any questions you have with your health care provider.  Scar Minimization You will have a scar anytime you have surgery and a cut is made in the skin or you have something removed from your skin (mole, skin cancer, cyst). Although scars are unavoidable following surgery, there are ways to minimize their appearance. It is important to follow all the instructions you receive from your caregiver about wound care. How your wound heals will influence the appearance of your scar. If you do not follow the wound care instructions as directed, complications such as infection may occur. Wound instructions include keeping the wound clean, moist, and not letting the wound form a scab. Some people form scars that are raised and lumpy (hypertrophic) or larger than the initial wound (keloidal). HOME CARE INSTRUCTIONS   Follow wound care instructions as directed.  Keep the wound clean by washing it with soap and water.  Keep the wound moist with provided antibiotic cream or petroleum jelly until completely healed. Moisten twice a day for about 2 weeks.  Get stitches (sutures) taken out at the scheduled time.  Avoid touching or manipulating your wound unless needed. Wash your hands thoroughly before and after touching your wound.  Follow all restrictions such as limits on exercise or work. This depends on where your scar is located.  Keep the scar protected from sunburn. Cover the scar with sunscreen/sunblock with SPF 30 or higher.  Gently massage the scar using a circular motion to help minimize the appearance of the scar. Do this only after the wound has closed and all the sutures have been removed.  For hypertrophic or keloidal scars, there are several ways to treat and minimize their appearance. Methods include compression therapy, intralesional corticosteroids, laser therapy, or surgery. These methods are performed  by your caregiver. Remember that the scar may appear lighter or darker than your normal skin color. This difference in color should even out with time. SEEK MEDICAL CARE IF:   You have a fever.  You develop signs of infection such as pain, redness, pus, and warmth.  You have questions or concerns. Document Released: 03/11/2010 Document Revised: 12/14/2011 Document Reviewed: 03/11/2010 [facility] Patient Information 2015 [place], [place]. This information is not intended to replace advice given to you by your health care provider. Make sure you discuss any questions you have with your health care provider.

## 2015-04-17 NOTE — ED Notes (Signed)
Pt here for suture removal from rt middle finger.  Placed on 7/3

## 2015-04-17 NOTE — ED Provider Notes (Signed)
CSN: [id number]     Arrival date & time 04/17/15  0915 History   First MD Initiated Contact with Patient 04/17/15 1003     Chief Complaint  Patient presents with  . Suture / Staple Removal     (Consider location/radiation/quality/duration/timing/severity/associated sxs/prior Treatment) HPI Comments: Jeffrey [name] is a 39 y.o. male with a PMHx of HTN, obesity, NICM, CHF, nephrolithiasis, DM2, HLD, asthma, renal cell carcinoma s/p nephrectomy, and CKD2, who presents to the ED for suture removal of his right middle finger. He denies any drainage, redness, swelling, loss of range of motion, or warmth. He has no complaints today. They were placed on 04/07/15.  Patient is a 39 y.o. male presenting with suture removal. The history is provided by the patient.  Suture / Staple Removal This is a new problem. The current episode started 1 to 4 weeks ago. The problem occurs constantly. The problem has been unchanged. Pertinent negatives include no arthralgias, chills, fever, joint swelling, myalgias, numbness or weakness. Nothing aggravates the symptoms. He has tried nothing for the symptoms. The treatment provided no relief.    Past Medical History  Diagnosis Date  . Hypertension   . Morbid obesity   . Presumed NICM     a. 04/2014 Myoview: EF 26%, glob HK, sev glob HK, ? prior infarct;  b. Never cathed 2/2 CKD.  [name] Chronic systolic CHF (congestive heart failure)     a. EF 20-25% in 2012. b. EF 45-50% in 10/2011 with nonischemic nuc - presumed NICM. c. 12/2014 Echo: Sev depressed LV fxn, sev dil LV, mild LVH, mild MR, sev dil LA, mildly reduced RV fxn.  . Nephrolithiasis   . OSA on CPAP   . High cholesterol   . Childhood asthma   . Type II diabetes mellitus   . Anxiety   . Renal cell carcinoma     a. s/p Rt robotic assisted partial converted to radical nephrectomy on 01/2013.  . Troponin level elevated     a. 04/2014, 12/2014: felt due to CHF.  . CKD (chronic kidney disease) stage 2, GFR 60-89 ml/min    . Aspirin allergy    Past Surgical History  Procedure Laterality Date  . Robotic assited partial nephrectomy Right 01/13/2013    Procedure: ROBOTIC ASSITED PARTIAL NEPHRECTOMY CONVERTED TO ROBOTIC ASSISTED RIGHT RADICAL NEPHRECTOMY;  Surgeon: Alexis Frock, MD;  Location: [facility];  Service: Urology;  Laterality: Right;  . Robotic assisted laparoscopic lysis of adhesion  01/13/2013    Procedure: ROBOTIC ASSISTED LAPAROSCOPIC LYSIS OF ADHESION EXTENSIVE;  Surgeon: Alexis Frock, MD;  Location: [facility];  Service: Urology;;  . Appendectomy  07/2004   Family History  Problem Relation Age of Onset  . Diabetes Other   . Hypertension Other   . Heart attack Neg Hx   . Stroke Neg Hx   . Hypertension Mother   . Diabetes Maternal Aunt    History  Substance Use Topics  . Smoking status: Light Tobacco Smoker -- 0.25 packs/day for 22 years    Types: Cigarettes    Last Attempt to Quit: 03/27/2014  . Smokeless tobacco: Never Used  . Alcohol Use: 9.6 oz/week    16 Shots of liquor per week     Comment: 04/05/2014 "1/2 pint/weekend nite; total ~ 1 1/2 pint brandy"    Review of Systems  Constitutional: Negative for fever and chills.  Musculoskeletal: Negative for myalgias, joint swelling and arthralgias.  Skin: Negative for color change.  Neurological: Negative for weakness and  numbness.   10 Systems reviewed and are negative for acute change except as noted in the HPI.    Allergies  Aspirin; Bee venom; and Tomato  Home Medications   Prior to Admission medications   Medication Sig Start Date End Date Taking? Authorizing Provider  acetaminophen (TYLENOL) 500 MG tablet Take 1,000 mg by mouth every 6 (six) hours as needed for mild pain.    Historical Provider, MD  allopurinol (ZYLOPRIM) 100 MG tablet Take 2 tablets (200 mg total) by mouth daily. 03/11/15   Jolaine Artist, MD  atorvastatin (LIPITOR) 80 MG tablet TAKE 1 TABLET (80 MG TOTAL) BY MOUTH EVERY EVENING. 10/25/14   Fay Records, MD   carvedilol (COREG) 6.25 MG tablet Take 3 tablets (18.75 mg total) by mouth 2 (two) times daily. 12/30/14   Ripudeep Krystal Eaton, MD  colchicine 0.6 MG tablet Take 1 tablet (0.6 mg total) by mouth daily. 12/30/14   Ripudeep Krystal Eaton, MD  EPINEPHrine (EPIPEN) 0.3 mg/0.3 mL SOAJ injection Inject 0.3 mLs (0.3 mg total) into the muscle as needed (as needed for severe allergy). 06/18/13   Ezequiel Essex, MD  hydrALAZINE (APRESOLINE) 10 MG tablet Take 1 tablet (10 mg total) by mouth 2 (two) times daily at 10 AM and 5 PM. 01/22/15   Rogelia Mire, NP  insulin NPH-regular Human (NOVOLIN 70/30) (70-30) 100 UNIT/ML injection Inject 34 Units into the skin 2 (two) times daily with a meal. 12/30/14   Ripudeep Krystal Eaton, MD  isosorbide mononitrate (IMDUR) 30 MG 24 hr tablet Take 1 tablet (30 mg total) by mouth daily. 01/22/15   Rogelia Mire, NP  lisinopril (PRINIVIL,ZESTRIL) 10 MG tablet Take 1 tablet (10 mg total) by mouth daily. 03/11/15   Jolaine Artist, MD  OVER THE COUNTER MEDICATION Take 1 tablet by mouth 3 (three) times daily.    Historical Provider, MD  oxyCODONE-acetaminophen (PERCOCET/ROXICET) 5-325 MG per tablet Take 1 tablet by mouth every 6 (six) hours as needed for severe pain. 12/30/14   Ripudeep Krystal Eaton, MD  torsemide (DEMADEX) 20 MG tablet Take 1 tablet (20 mg total) by mouth 2 (two) times daily. 12/30/14   Ripudeep K Rai, MD   BP 131/99 mmHg  Pulse 84  Temp(Src) 98 F (36.7 C) (Oral)  Resp 20  SpO2 99% Physical Exam  Constitutional: He is oriented to person, place, and time. Vital signs are normal. He appears well-developed and well-nourished.  Non-toxic appearance. No distress.  Afebrile, nontoxic, NAD  HENT:  Head: Normocephalic and atraumatic.  Mouth/Throat: Mucous membranes are normal.  Eyes: Conjunctivae and EOM are normal. Right eye exhibits no discharge. Left eye exhibits no discharge.  Neck: Normal range of motion. Neck supple.  Cardiovascular: Normal rate.   Pulmonary/Chest: Effort  normal. No respiratory distress.  Abdominal: Normal appearance. He exhibits no distension.  Musculoskeletal: Normal range of motion.       Right hand: He exhibits laceration (healed). He exhibits normal range of motion, no tenderness, normal two-point discrimination, normal capillary refill and no swelling. Normal sensation noted. Normal strength noted.  Healed lac to R middle finger across volar aspect of middle phalanx. No swelling or erythema, no warmth, no drainage. 3 sutures in place. Strength and sensation grossly intact, distal pulses intact, cap refill brisk and present.  Neurological: He is alert and oriented to person, place, and time. He has normal strength. No sensory deficit.  Skin: Skin is warm, dry and intact. No rash noted.  Psychiatric: He has  a normal mood and affect.  Nursing note and vitals reviewed.   ED Course  SUTURE REMOVAL Date/Time: 04/17/2015 10:29 AM Performed by: Shann Medal Kaedon Fanelli Authorized by: Zacarias Pontes Consent: Verbal consent obtained. Risks and benefits: risks, benefits and alternatives were discussed Consent given by: patient Patient understanding: patient states understanding of the procedure being performed Patient consent: the patient's understanding of the procedure matches consent given Patient identity confirmed: verbally with patient Body area: upper extremity Location details: right long finger Wound Appearance: clean Sutures Removed: 3 Post-removal: dressing applied Facility: sutures placed in this facility Patient tolerance: Patient tolerated the procedure well with no immediate complications   (including critical care time) Labs Review Labs Reviewed - No data to display  Imaging Review No results found.   EKG Interpretation None      MDM   Final diagnoses:  Visit for suture removal    39 y.o. male here for suture removal. No issues. 3 sutures removed successfully, no wound separation or erythema/warmth.  NVI. FROM intact. Will have him f/up with his PCP in 3-5 days for recheck. I explained the diagnosis and have given explicit precautions to return to the ER including for any other new or worsening symptoms. The patient understands and accepts the medical plan as it's been dictated and I have answered their questions. Discharge instructions concerning home care and prescriptions have been given. The patient is STABLE and is discharged to home in good condition.  BP 131/99 mmHg  Pulse 84  Temp(Src) 98 F (36.7 C) (Oral)  Resp 20  SpO2 99%  No orders of the defined types were placed in this encounter.       [address] Basilio [place], PA-C 04/17/15 1032  Sherwood Gambler, MD 04/17/15 [phone]

## 2015-05-07 ENCOUNTER — Ambulatory Visit (HOSPITAL_BASED_OUTPATIENT_CLINIC_OR_DEPARTMENT_OTHER)
Admission: RE | Admit: 2015-05-07 | Discharge: 2015-05-07 | Disposition: A | Discharge status: Home / Self Care | Payer: Self-pay | Source: Ambulatory Visit | Attending: Cardiology | Admitting: Cardiology

## 2015-05-07 ENCOUNTER — Ambulatory Visit (HOSPITAL_COMMUNITY)
Admission: RE | Admit: 2015-05-07 | Discharge: 2015-05-07 | Disposition: A | Discharge status: Home / Self Care | Payer: Self-pay | Source: Ambulatory Visit | Attending: Internal Medicine | Admitting: Internal Medicine

## 2015-05-07 VITALS — BP 130/98 | HR 76 | Wt 289.0 lb

## 2015-05-07 DIAGNOSIS — I340 Nonrheumatic mitral (valve) insufficiency: Secondary | ICD-10-CM | POA: Insufficient documentation

## 2015-05-07 DIAGNOSIS — I5023 Acute on chronic systolic (congestive) heart failure: Secondary | ICD-10-CM

## 2015-05-07 DIAGNOSIS — E785 Hyperlipidemia, unspecified: Secondary | ICD-10-CM | POA: Insufficient documentation

## 2015-05-07 DIAGNOSIS — I10 Essential (primary) hypertension: Secondary | ICD-10-CM | POA: Insufficient documentation

## 2015-05-07 DIAGNOSIS — I517 Cardiomegaly: Secondary | ICD-10-CM | POA: Insufficient documentation

## 2015-05-07 DIAGNOSIS — E119 Type 2 diabetes mellitus without complications: Secondary | ICD-10-CM | POA: Insufficient documentation

## 2015-05-07 DIAGNOSIS — I5022 Chronic systolic (congestive) heart failure: Secondary | ICD-10-CM

## 2015-05-07 MED ORDER — HYDRALAZINE HCL 25 MG PO TABS: 25.0000 mg | ORAL_TABLET | Freq: Three times a day (TID) | ORAL | Status: DC

## 2015-05-07 MED ORDER — SPIRONOLACTONE 25 MG PO TABS: 12.5000 mg | ORAL_TABLET | Freq: Every day | ORAL | Status: DC

## 2015-05-07 MED ORDER — INSULIN NPH ISOPHANE & REGULAR (70-30) 100 UNIT/ML ~~LOC~~ SUSP: 34.0000 [IU] | Freq: Two times a day (BID) | SUBCUTANEOUS | Status: DC

## 2015-05-07 NOTE — Progress Notes (Signed)
Patient ID: Jeffrey [name], male   DOB: Jan 18, 1976, 39 y.o.   MRN: [id number]  Patient Name: Jeffrey [name] Date of Encounter: 05/07/2015  Primary Care Provider:  Angelica Chessman, MD Primary Cardiologist:  Lizbeth Bark, MD  Primary HF Cardiologist: Bensimhon  History:  Ms. 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   Echo in 2012 with EF 20-25%.  In 1/13 had nuclear study EF 45-50% suggestive of NICM.    Admitted to [facility] in 3/16 secondary to atypical chest pain, dyspnea, and mild troponin elevation. Echo during hospitalization shows "severely reduced LV function" - EF not quantified due to poor windows. He was diuresed and was down about 7 pounds at discharge, at 289 on the hospital scale.   He returns today for HF follow up.  At last visit we increased his lisinopril. Overall feeling great. He says he had some ankle edema, but elevated his feet and felt fine. On allupurinol.  Takes all medications as directed.  Is out of Insulin, does not have a PCP. Still working FT at [employer] without any problems.  Watching salt and fluid intake. Walks 1.5 - 2 miles when not working. No SOB or CP. He denies PND, dizziness, syncope, or early satiety.  Doesn't wear CPAP regularly, but more so than in the past.  Is getting CPAP checked at [facility] today, he says it needs a new filter.  Echo was done today and reviewed.  EF 20-25%, grade II diastolic dysfunction, diffuse hypokinesis, moderate LV dilation, mild LVH, normal RV size with mildly decreased systolic function.    Labs (6/16): K 4.1, creatinine 1.52, BNP 133  Past Medical History   Past Medical History  Diagnosis Date  . Hypertension   . Morbid obesity   . Presumed NICM     a. 04/2014 Myoview: EF 26%, glob HK, sev glob HK, ? prior infarct;  b. Never cathed 2/2 CKD.   [name] Chronic systolic CHF (congestive heart failure)     a. EF 20-25% in 2012. b. EF 45-50% in 10/2011 with nonischemic nuc - presumed NICM. c. 12/2014 Echo: Sev depressed LV fxn, sev dil LV, mild LVH, mild MR, sev dil LA, mildly reduced RV fxn.  . Nephrolithiasis   . OSA on CPAP   . High cholesterol   . Childhood asthma   . Type II diabetes mellitus   . Anxiety   . Renal cell carcinoma     a. s/p Rt robotic assisted partial converted to radical nephrectomy on 01/2013.  . Troponin level elevated     a. 04/2014, 12/2014: felt due to CHF.  . CKD (chronic kidney disease) stage 2, GFR 60-89 ml/min   . Aspirin allergy    Past Surgical History  Procedure Laterality Date  . Robotic assited partial nephrectomy Right 01/13/2013    Procedure: ROBOTIC ASSITED PARTIAL NEPHRECTOMY CONVERTED TO ROBOTIC ASSISTED RIGHT RADICAL NEPHRECTOMY;  Surgeon: Alexis Frock, MD;  Location: [facility];  Service: Urology;  Laterality: Right;  . Robotic assisted laparoscopic lysis of adhesion  01/13/2013    Procedure: ROBOTIC ASSISTED LAPAROSCOPIC LYSIS OF ADHESION EXTENSIVE;  Surgeon: Alexis Frock, MD;  Location: [facility];  Service: Urology;;  . Appendectomy  07/2004    Allergies  Allergies  Allergen Reactions  . Aspirin Shortness Of Breath and Rash    Rash, itching,  burning sensation  (Patient reports he tolerates other NSAIDS)   . Bee Venom Hives and Swelling  . Tomato Rash     Home Medications  Prior to Admission medications   Medication Sig Start Date End Date Taking? Authorizing Provider  acetaminophen (TYLENOL) 500 MG tablet Take 1,000 mg by mouth every 6 (six) hours as needed for mild pain.   Yes Historical Provider, MD  allopurinol (ZYLOPRIM) 100 MG tablet Take 1 tablet (100 mg total) by mouth daily. 12/30/14  Yes Ripudeep Krystal Eaton, MD  atorvastatin (LIPITOR) 80 MG tablet TAKE 1 TABLET (80 MG TOTAL) BY MOUTH EVERY EVENING. 10/25/14  Yes Fay Records, MD  carvedilol (COREG) 6.25 MG tablet Take 3 tablets (18.75  mg total) by mouth 2 (two) times daily. 12/30/14  Yes Ripudeep Krystal Eaton, MD  colchicine 0.6 MG tablet Take 1 tablet (0.6 mg total) by mouth daily. 12/30/14  Yes Ripudeep Krystal Eaton, MD  EPINEPHrine (EPIPEN) 0.3 mg/0.3 mL SOAJ injection Inject 0.3 mLs (0.3 mg total) into the muscle as needed (as needed for severe allergy). 06/18/13  Yes Ezequiel Essex, MD  insulin NPH-regular Human (NOVOLIN 70/30) (70-30) 100 UNIT/ML injection Inject 34 Units into the skin 2 (two) times daily with a meal. 12/30/14  Yes Ripudeep K Rai, MD  lisinopril (PRINIVIL,ZESTRIL) 5 MG tablet Take 1 tablet (5 mg total) by mouth daily. 12/30/14  Yes Ripudeep Krystal Eaton, MD  OVER THE COUNTER MEDICATION Take 1 tablet by mouth 3 (three) times daily.   Yes Historical Provider, MD  oxyCODONE-acetaminophen (PERCOCET/ROXICET) 5-325 MG per tablet Take 1 tablet by mouth every 6 (six) hours as needed for severe pain. 12/30/14  Yes Ripudeep Krystal Eaton, MD  torsemide (DEMADEX) 20 MG tablet Take 1 tablet (20 mg total) by mouth 2 (two) times daily. 12/30/14  Yes Ripudeep Krystal Eaton, MD  isosorbide-hydrALAZINE (BIDIL) 20-37.5 MG per tablet Take 1 tablet by mouth 2 (two) times daily. 01/22/15   Fay Records, MD    Review of Systems  As above, he has been feeling well without dyspnea on exertion, PND, edema, or early satiety.  He does have chronic orthopnea. He reports compliance with medications.  All other systems reviewed and are otherwise negative except as noted above.  Physical Exam  VS:  BP 130/98 mmHg  Pulse 76  Wt 289 lb (131.09 kg)  SpO2 98% , Body mass index is 45.25 kg/(m^2). GEN: Well nourished, well developed, in no acute distress. HEENT: normal. Neck: Supple, no JVD, carotid bruits, or masses. Cardiac: RRR, no murmurs, rubs, or gallops. No clubbing, cyanosis, edema.  Radials/DP/PT 2+ and equal bilaterally.  Respiratory:  Respirations regular and unlabored, clear to auscultation bilaterally. GI: Obese, soft, nontender, nondistended, BS + x 4. MS: no  deformity or atrophy. Skin: warm and dry, no rash. Neuro:  Strength and sensation are intact. Psych: Normal affect.  Assessment & Plan  1.  Chronic systolic congestive heart failure: Nonischemic cardiomyopathy by Cardiol[facility]. Echo 05/07/15 with 20-25% EF.  Suspect due to HTN. NYHA class II symptoms, no volume overload.   - EF persistently low, will refer to EP for ICD.  Narrow QRS, would not benefit from CRT.  - Continue lisinopril to 10 mg daily - Continue Coreg 18.75 mg bid. - Increase hydralazine to 25 mg TID and continue Imdur 30 mg daily.  - Add spironolactone at 12.5 mg daily, BMET/BNP in 10 days.  - Echo today reviewed by Dr Aundra Dubin and reviewed with patient. - Continue Torsemide 20  mg bid.  2. Hypertension: Stable today. 3. Stage II chronic kidney disease: Follow closely.  4. OSA: stressed need to be compliant with CPAP 5. Tobacco use: No smoking for about 3 weeks.  6. Gout: Continue allopurinol 200 daily for prophylaxis.  7. DM2: Out of insulin. Will refill today, but will need PCP to fill in the future.  Labs 10 days.  Follow up in 6 weeks. Refer to EP.  Shirley Friar, PA-C 12:29 PM   Patient seen with PA, agree with the above note.  Nonischemic cardiomyopathy with persistently low EF, narrow QRS.  NYHA class II symptoms, not volume overloaded.  - Refer to EP for ICD. - Increase hydralazine to 25 mg bid and continue Imdur.   - Add spironolactone 12.5 mg daily with BMET in 10 days.   Loralie Champagne 05/08/2015

## 2015-05-07 NOTE — Patient Instructions (Signed)
START Spironolactone 12.5, one tab daily INCREASE Hydralazine to 25 mg, one tab three times per day  Your insulin has been refilled for one month only, please follow up with [facility] for further refills  You have been referred to [facility] (Electrophysiology) [place] floor [phone]  Labs needed in 10 days (BMET,BNP)  Your physician recommends that you schedule a follow-up appointment in: 6 weeks  Do the following things EVERYDAY: 1) Weigh yourself in the morning before breakfast. Write it down and keep it in a log. 2) Take your medicines as prescribed 3) Eat low salt foods-Limit salt (sodium) to 2000 mg per day.  4) Stay as active as you can everyday 5) Limit all fluids for the day to less than 2 liters 6)

## 2015-05-07 NOTE — Progress Notes (Signed)
  Echocardiogram 2D Echocardiogram has been performed.  Jeffrey [name] 05/07/2015, 11:57 AM

## 2015-05-14 ENCOUNTER — Encounter (HOSPITAL_COMMUNITY): Payer: Self-pay

## 2015-05-14 ENCOUNTER — Inpatient Hospital Stay (HOSPITAL_COMMUNITY)
Admission: EM | Admit: 2015-05-14 | Discharge: 2015-05-19 | DRG: 245 | Disposition: A | Discharge status: Home / Self Care | Payer: Self-pay | Attending: Internal Medicine | Admitting: Internal Medicine

## 2015-05-14 ENCOUNTER — Emergency Department (HOSPITAL_COMMUNITY): Payer: Self-pay

## 2015-05-14 DIAGNOSIS — M109 Gout, unspecified: Secondary | ICD-10-CM

## 2015-05-14 DIAGNOSIS — N189 Chronic kidney disease, unspecified: Secondary | ICD-10-CM

## 2015-05-14 DIAGNOSIS — N183 Chronic kidney disease, stage 3 unspecified: Secondary | ICD-10-CM | POA: Diagnosis present

## 2015-05-14 DIAGNOSIS — M10069 Idiopathic gout, unspecified knee: Secondary | ICD-10-CM

## 2015-05-14 DIAGNOSIS — R0789 Other chest pain: Secondary | ICD-10-CM

## 2015-05-14 DIAGNOSIS — Z6841 Body Mass Index (BMI) 40.0 and over, adult: Secondary | ICD-10-CM

## 2015-05-14 DIAGNOSIS — Z888 Allergy status to other drugs, medicaments and biological substances status: Secondary | ICD-10-CM

## 2015-05-14 DIAGNOSIS — F419 Anxiety disorder, unspecified: Secondary | ICD-10-CM | POA: Diagnosis present

## 2015-05-14 DIAGNOSIS — I459 Conduction disorder, unspecified: Secondary | ICD-10-CM | POA: Diagnosis present

## 2015-05-14 DIAGNOSIS — E1159 Type 2 diabetes mellitus with other circulatory complications: Secondary | ICD-10-CM | POA: Diagnosis present

## 2015-05-14 DIAGNOSIS — E1122 Type 2 diabetes mellitus with diabetic chronic kidney disease: Secondary | ICD-10-CM | POA: Diagnosis present

## 2015-05-14 DIAGNOSIS — I5042 Chronic combined systolic (congestive) and diastolic (congestive) heart failure: Secondary | ICD-10-CM

## 2015-05-14 DIAGNOSIS — Z8249 Family history of ischemic heart disease and other diseases of the circulatory system: Secondary | ICD-10-CM

## 2015-05-14 DIAGNOSIS — Z905 Acquired absence of kidney: Secondary | ICD-10-CM | POA: Diagnosis present

## 2015-05-14 DIAGNOSIS — Z87891 Personal history of nicotine dependence: Secondary | ICD-10-CM

## 2015-05-14 DIAGNOSIS — I472 Ventricular tachycardia: Secondary | ICD-10-CM | POA: Diagnosis not present

## 2015-05-14 DIAGNOSIS — I200 Unstable angina: Secondary | ICD-10-CM | POA: Diagnosis present

## 2015-05-14 DIAGNOSIS — G4733 Obstructive sleep apnea (adult) (pediatric): Secondary | ICD-10-CM | POA: Diagnosis present

## 2015-05-14 DIAGNOSIS — I5023 Acute on chronic systolic (congestive) heart failure: Secondary | ICD-10-CM | POA: Insufficient documentation

## 2015-05-14 DIAGNOSIS — N179 Acute kidney failure, unspecified: Secondary | ICD-10-CM | POA: Diagnosis not present

## 2015-05-14 DIAGNOSIS — I129 Hypertensive chronic kidney disease with stage 1 through stage 4 chronic kidney disease, or unspecified chronic kidney disease: Secondary | ICD-10-CM | POA: Diagnosis present

## 2015-05-14 DIAGNOSIS — E780 Pure hypercholesterolemia: Secondary | ICD-10-CM | POA: Diagnosis present

## 2015-05-14 DIAGNOSIS — I5022 Chronic systolic (congestive) heart failure: Secondary | ICD-10-CM | POA: Diagnosis present

## 2015-05-14 DIAGNOSIS — E1165 Type 2 diabetes mellitus with hyperglycemia: Secondary | ICD-10-CM | POA: Diagnosis present

## 2015-05-14 DIAGNOSIS — I428 Other cardiomyopathies: Principal | ICD-10-CM | POA: Diagnosis present

## 2015-05-14 DIAGNOSIS — N182 Chronic kidney disease, stage 2 (mild): Secondary | ICD-10-CM | POA: Diagnosis present

## 2015-05-14 DIAGNOSIS — Z833 Family history of diabetes mellitus: Secondary | ICD-10-CM

## 2015-05-14 DIAGNOSIS — Z95810 Presence of automatic (implantable) cardiac defibrillator: Secondary | ICD-10-CM | POA: Insufficient documentation

## 2015-05-14 DIAGNOSIS — Z85528 Personal history of other malignant neoplasm of kidney: Secondary | ICD-10-CM

## 2015-05-14 DIAGNOSIS — Z95818 Presence of other cardiac implants and grafts: Secondary | ICD-10-CM

## 2015-05-14 DIAGNOSIS — I10 Essential (primary) hypertension: Secondary | ICD-10-CM

## 2015-05-14 DIAGNOSIS — Z794 Long term (current) use of insulin: Secondary | ICD-10-CM

## 2015-05-14 DIAGNOSIS — R7989 Other specified abnormal findings of blood chemistry: Secondary | ICD-10-CM

## 2015-05-14 DIAGNOSIS — R778 Other specified abnormalities of plasma proteins: Secondary | ICD-10-CM

## 2015-05-14 DIAGNOSIS — Z91030 Bee allergy status: Secondary | ICD-10-CM

## 2015-05-14 DIAGNOSIS — Z886 Allergy status to analgesic agent status: Secondary | ICD-10-CM

## 2015-05-14 DIAGNOSIS — I272 Other secondary pulmonary hypertension: Secondary | ICD-10-CM | POA: Diagnosis present

## 2015-05-14 DIAGNOSIS — IMO0002 Reserved for concepts with insufficient information to code with codable children: Secondary | ICD-10-CM

## 2015-05-14 DIAGNOSIS — I252 Old myocardial infarction: Secondary | ICD-10-CM

## 2015-05-14 DIAGNOSIS — Z91018 Allergy to other foods: Secondary | ICD-10-CM

## 2015-05-14 DIAGNOSIS — R079 Chest pain, unspecified: Secondary | ICD-10-CM | POA: Diagnosis present

## 2015-05-14 LAB — BRAIN NATRIURETIC PEPTIDE: B NATRIURETIC PEPTIDE 5: 559.1 pg/mL — AB (ref 0.0–100.0)

## 2015-05-14 LAB — CBC
HCT: 45.4 % (ref 39.0–52.0)
HEMOGLOBIN: 15.3 g/dL (ref 13.0–17.0)
MCH: 26 pg (ref 26.0–34.0)
MCHC: 33.7 g/dL (ref 30.0–36.0)
MCV: 77.1 fL — AB (ref 78.0–100.0)
Platelets: 263 10*3/uL (ref 150–400)
RBC: 5.89 MIL/uL — ABNORMAL HIGH (ref 4.22–5.81)
RDW: 16.3 % — AB (ref 11.5–15.5)
WBC: 8.7 10*3/uL (ref 4.0–10.5)

## 2015-05-14 LAB — BASIC METABOLIC PANEL
ANION GAP: 9 (ref 5–15)
BUN: 19 mg/dL (ref 6–20)
CALCIUM: 9.4 mg/dL (ref 8.9–10.3)
CHLORIDE: 107 mmol/L (ref 101–111)
CO2: 22 mmol/L (ref 22–32)
CREATININE: 1.76 mg/dL — AB (ref 0.61–1.24)
GFR calc Af Amer: 54 mL/min — ABNORMAL LOW (ref >60)
GFR calc non Af Amer: 47 mL/min — ABNORMAL LOW (ref >60)
GLUCOSE: 161 mg/dL — AB (ref 65–99)
Potassium: 4.1 mmol/L (ref 3.5–5.1)
SODIUM: 138 mmol/L (ref 135–145)

## 2015-05-14 LAB — I-STAT TROPONIN, ED: Troponin i, poc: 0.05 ng/mL (ref 0.00–0.08)

## 2015-05-14 LAB — TROPONIN I: TROPONIN I: 0.04 ng/mL — AB (ref <0.031)

## 2015-05-14 MED ORDER — INSULIN ASPART PROT & ASPART (70-30 MIX) 100 UNIT/ML ~~LOC~~ SUSP
34.0000 [IU] | Freq: Two times a day (BID) | SUBCUTANEOUS | Status: DC
  Administered 2015-05-15 – 2015-05-19 (×7): 34 [IU] via SUBCUTANEOUS
  Filled 2015-05-14: qty 10

## 2015-05-14 MED ORDER — CARVEDILOL 6.25 MG PO TABS
18.7500 mg | ORAL_TABLET | Freq: Two times a day (BID) | ORAL | Status: DC
  Administered 2015-05-14 – 2015-05-19 (×8): 18.75 mg via ORAL
  Filled 2015-05-14 (×18): qty 1

## 2015-05-14 MED ORDER — COLCHICINE 0.6 MG PO TABS
0.6000 mg | ORAL_TABLET | Freq: Every day | ORAL | Status: DC
  Administered 2015-05-14 – 2015-05-16 (×3): 0.6 mg via ORAL
  Filled 2015-05-14 (×5): qty 1

## 2015-05-14 MED ORDER — ISOSORBIDE MONONITRATE ER 30 MG PO TB24
30.0000 mg | ORAL_TABLET | Freq: Every day | ORAL | Status: DC
  Administered 2015-05-15 – 2015-05-19 (×4): 30 mg via ORAL
  Filled 2015-05-14 (×4): qty 1

## 2015-05-14 MED ORDER — SODIUM CHLORIDE 0.9 % IJ SOLN: 3.0000 mL | INTRAMUSCULAR | Status: DC | PRN

## 2015-05-14 MED ORDER — ALLOPURINOL 100 MG PO TABS
100.0000 mg | ORAL_TABLET | Freq: Two times a day (BID) | ORAL | Status: DC
  Administered 2015-05-14 – 2015-05-16 (×4): 100 mg via ORAL
  Filled 2015-05-14 (×4): qty 1

## 2015-05-14 MED ORDER — ZOLPIDEM TARTRATE 5 MG PO TABS: 5.0000 mg | ORAL_TABLET | Freq: Every evening | ORAL | Status: DC | PRN

## 2015-05-14 MED ORDER — SPIRONOLACTONE 12.5 MG HALF TABLET
12.5000 mg | ORAL_TABLET | Freq: Every day | ORAL | Status: DC
  Administered 2015-05-15: 12.5 mg via ORAL
  Filled 2015-05-14 (×3): qty 1

## 2015-05-14 MED ORDER — ALLOPURINOL 100 MG PO TABS: 200.0000 mg | ORAL_TABLET | Freq: Every day | ORAL | Status: DC

## 2015-05-14 MED ORDER — ATORVASTATIN CALCIUM 80 MG PO TABS
80.0000 mg | ORAL_TABLET | Freq: Every day | ORAL | Status: DC
  Administered 2015-05-15 – 2015-05-18 (×3): 80 mg via ORAL
  Filled 2015-05-14 (×4): qty 1

## 2015-05-14 MED ORDER — SODIUM CHLORIDE 0.9 % IV SOLN
250.0000 mL | INTRAVENOUS | Status: DC | PRN
Start: 2015-05-14 — End: 2015-05-19
  Administered 2015-05-17 (×2): via INTRAVENOUS

## 2015-05-14 MED ORDER — HYDRALAZINE HCL 25 MG PO TABS
25.0000 mg | ORAL_TABLET | Freq: Three times a day (TID) | ORAL | Status: DC
  Administered 2015-05-14 – 2015-05-19 (×11): 25 mg via ORAL
  Filled 2015-05-14 (×12): qty 1

## 2015-05-14 MED ORDER — HEPARIN SODIUM (PORCINE) 5000 UNIT/ML IJ SOLN
5000.0000 [IU] | Freq: Three times a day (TID) | INTRAMUSCULAR | Status: DC
  Administered 2015-05-14 – 2015-05-19 (×11): 5000 [IU] via SUBCUTANEOUS
  Filled 2015-05-14 (×10): qty 1

## 2015-05-14 MED ORDER — ACETAMINOPHEN 325 MG PO TABS
650.0000 mg | ORAL_TABLET | ORAL | Status: DC | PRN
  Administered 2015-05-15: 650 mg via ORAL
  Filled 2015-05-14: qty 2

## 2015-05-14 MED ORDER — EPINEPHRINE 0.3 MG/0.3ML IJ SOAJ
0.3000 mg | INTRAMUSCULAR | Status: DC | PRN
  Filled 2015-05-14: qty 0.6

## 2015-05-14 MED ORDER — LISINOPRIL 10 MG PO TABS
10.0000 mg | ORAL_TABLET | Freq: Every day | ORAL | Status: DC
  Administered 2015-05-15: 10 mg via ORAL
  Filled 2015-05-14: qty 1

## 2015-05-14 MED ORDER — ONDANSETRON HCL 4 MG/2ML IJ SOLN
4.0000 mg | Freq: Four times a day (QID) | INTRAMUSCULAR | Status: DC | PRN
Start: 2015-05-14 — End: 2015-05-17

## 2015-05-14 MED ORDER — TORSEMIDE 20 MG PO TABS
20.0000 mg | ORAL_TABLET | Freq: Two times a day (BID) | ORAL | Status: DC
  Administered 2015-05-14 – 2015-05-15 (×3): 20 mg via ORAL
  Filled 2015-05-14 (×3): qty 1

## 2015-05-14 MED ORDER — SODIUM CHLORIDE 0.9 % IJ SOLN
3.0000 mL | Freq: Two times a day (BID) | INTRAMUSCULAR | Status: DC
Start: 2015-05-14 — End: 2015-05-19
  Administered 2015-05-14 – 2015-05-16 (×5): 3 mL via INTRAVENOUS

## 2015-05-14 NOTE — ED Notes (Signed)
Pt reports onset 2:15p while watching TV mid chest pain, shortness of breath and lightheaded.  No respiratory distress, talking in complete sentences.

## 2015-05-14 NOTE — H&P (Signed)
[facility] History and Physical  Jeffrey [name] KXF:[id number] DOB: 02-Oct-1976 DOA: 05/14/2015  Referring physician: Georgina Peer, PA PCP: Angelica Chessman, MD   Chief Complaint: Chest Pain  HPI: Jeffrey [name] is a 39 y.o. male with history of CHF HTN Chest pain NSTEMI DM Type 2 Uncontrolled presents to the ED with chest pain. Patient states that he started having central chest pain around 2PM today. He had shortness of breath associated. He states there was no radiation of the pain. He denies having headache dizziness and did not pass out. He had dizziness earlier though. He was in the house sitting when the pain started. He states that he has had an MI in the past and also has a history of CHF. By his chart he had an echo done which shows an EF of 20-25%. He is on maximal therapy with his CHF medications. He has no worsening of ankle edema noted. He has no abdominal pain. His CXR actually looks improved from prior films.   Review of Systems:  Constitutional:  No weight loss, night sweats, Fevers, chills, fatigue.  HEENT:  No headaches,nasal congestion, post nasal drip,  Cardio-vascular:  +chest pain, no Orthopnea, +swelling in lower extremities  GI:  No heartburn, indigestion, abdominal pain, nausea, vomiting, diarrhea  Resp:  No shortness of breath with exertion or at rest. No coughing up of blood.No change in color of mucus.No wheezing Skin:  no rash or lesions.  GU:  no dysuria, change in color of urine, no urgency or frequency Musculoskeletal:  No joint pain or swelling. No decreased range of motion Psych:  No change in mood or affect. No depression or anxiety. No memory loss.   Past Medical History  Diagnosis Date  . Hypertension   . Morbid obesity   . Presumed NICM     a. 04/2014 Myoview: EF 26%, glob HK, sev glob HK, ? prior infarct;  b. Never cathed 2/2 CKD.  [name] Chronic systolic CHF (congestive heart failure)     a. EF 20-25% in 2012. b. EF 45-50% in 10/2011 with  nonischemic nuc - presumed NICM. c. 12/2014 Echo: Sev depressed LV fxn, sev dil LV, mild LVH, mild MR, sev dil LA, mildly reduced RV fxn.  . Nephrolithiasis   . OSA on CPAP   . High cholesterol   . Childhood asthma   . Type II diabetes mellitus   . Anxiety   . Renal cell carcinoma     a. s/p Rt robotic assisted partial converted to radical nephrectomy on 01/2013.  . Troponin level elevated     a. 04/2014, 12/2014: felt due to CHF.  . CKD (chronic kidney disease) stage 2, GFR 60-89 ml/min   . Aspirin allergy    Past Surgical History  Procedure Laterality Date  . Robotic assited partial nephrectomy Right 01/13/2013    Procedure: ROBOTIC ASSITED PARTIAL NEPHRECTOMY CONVERTED TO ROBOTIC ASSISTED RIGHT RADICAL NEPHRECTOMY;  Surgeon: Alexis Frock, MD;  Location: [facility];  Service: Urology;  Laterality: Right;  . Robotic assisted laparoscopic lysis of adhesion  01/13/2013    Procedure: ROBOTIC ASSISTED LAPAROSCOPIC LYSIS OF ADHESION EXTENSIVE;  Surgeon: Alexis Frock, MD;  Location: [facility];  Service: Urology;;  . Appendectomy  07/2004   Social History:  reports that he quit smoking about 4 weeks ago. He has never used smokeless tobacco. He reports that he drinks alcohol. He reports that he uses illicit drugs (Marijuana).  Allergies  Allergen Reactions  . Aspirin Shortness Of Breath and  Rash    Rash, itching, burning sensation  (Patient reports he tolerates other NSAIDS)   . Bee Venom Hives and Swelling  . Tomato Rash    Family History  Problem Relation Age of Onset  . Diabetes Other   . Hypertension Other   . Heart attack Neg Hx   . Stroke Neg Hx   . Hypertension Mother   . Diabetes Maternal Aunt      Prior to Admission medications   Medication Sig Start Date End Date Taking? Authorizing Provider  acetaminophen (TYLENOL) 500 MG tablet Take 1,000 mg by mouth every 6 (six) hours as needed for mild pain.    Historical Provider, MD  allopurinol (ZYLOPRIM) 100 MG tablet Take 2 tablets  (200 mg total) by mouth daily. 03/11/15   Jolaine Artist, MD  atorvastatin (LIPITOR) 80 MG tablet TAKE 1 TABLET (80 MG TOTAL) BY MOUTH EVERY EVENING. 10/25/14   Fay Records, MD  carvedilol (COREG) 6.25 MG tablet Take 3 tablets (18.75 mg total) by mouth 2 (two) times daily. 12/30/14   Ripudeep Krystal Eaton, MD  colchicine 0.6 MG tablet Take 1 tablet (0.6 mg total) by mouth daily. Patient not taking: Reported on 05/07/2015 12/30/14   Ripudeep Krystal Eaton, MD  EPINEPHrine (EPIPEN) 0.3 mg/0.3 mL SOAJ injection Inject 0.3 mLs (0.3 mg total) into the muscle as needed (as needed for severe allergy). 06/18/13   Ezequiel Essex, MD  hydrALAZINE (APRESOLINE) 25 MG tablet Take 1 tablet (25 mg total) by mouth 3 (three) times daily. 05/07/15   Larey Dresser, MD  insulin NPH-regular Human (NOVOLIN 70/30) (70-30) 100 UNIT/ML injection Inject 34 Units into the skin 2 (two) times daily with a meal. 05/07/15   Larey Dresser, MD  isosorbide mononitrate (IMDUR) 30 MG 24 hr tablet Take 1 tablet (30 mg total) by mouth daily. 01/22/15   Rogelia Mire, NP  lisinopril (PRINIVIL,ZESTRIL) 10 MG tablet Take 1 tablet (10 mg total) by mouth daily. 03/11/15   Jolaine Artist, MD  OVER THE COUNTER MEDICATION Take 1 tablet by mouth 3 (three) times daily.    Historical Provider, MD  spironolactone (ALDACTONE) 25 MG tablet Take 0.5 tablets (12.5 mg total) by mouth daily. 05/07/15   Larey Dresser, MD  torsemide (DEMADEX) 20 MG tablet Take 1 tablet (20 mg total) by mouth 2 (two) times daily. 12/30/14   Ripudeep Krystal Eaton, MD   Physical Exam: Filed Vitals:   05/14/15 1915 05/14/15 1930 05/14/15 1945 05/14/15 2000  BP: 137/99 139/94 112/78 117/94  Pulse: 71 71 79 81  Temp:      TempSrc:      Resp: 16 19 20    SpO2: 100% 97% 98% 98%    Wt Readings from Last 3 Encounters:  05/07/15 131.09 kg (289 lb)  03/11/15 135.172 kg (298 lb)  01/22/15 134.174 kg (295 lb 12.8 oz)    General:  Appears calm and comfortable Eyes: PERRL, normal lids, irises  & conjunctiva ENT: grossly normal hearing, lips & tongue Neck: no LAD, masses or thyromegaly Cardiovascular: RRR, no m/r/g. 1+LE edema Respiratory: CTA bilaterally, no w/r/r Abdomen: soft, ntnd Skin: no rash or induration seen on limited exam Musculoskeletal: grossly normal tone BUE/BLE Psychiatric: grossly normal mood and affect Neurologic: grossly non-focal.          Labs on Admission:  Basic Metabolic Panel:  Recent Labs Lab 05/14/15 1439  NA 138  K 4.1  CL 107  CO2 22  GLUCOSE 161*  BUN 19  CREATININE 1.76*  CALCIUM 9.4   Liver Function Tests: No results for input(s): AST, ALT, ALKPHOS, BILITOT, PROT, ALBUMIN in the last 168 hours. No results for input(s): LIPASE, AMYLASE in the last 168 hours. No results for input(s): AMMONIA in the last 168 hours. CBC:  Recent Labs Lab 05/14/15 1439  WBC 8.7  HGB 15.3  HCT 45.4  MCV 77.1*  PLT 263   Cardiac Enzymes:  Recent Labs Lab 05/14/15 1850  TROPONINI 0.04*    BNP (last 3 results)  Recent Labs  12/28/14 0023 03/11/15 1311 05/14/15 1850  BNP 1066.1* 133.4* 559.1*    ProBNP (last 3 results)  Recent Labs  07/05/14 0906  PROBNP 163.0*    CBG: No results for input(s): GLUCAP in the last 168 hours.  Radiological Exams on Admission: Dg Chest 2 View  05/14/2015   CLINICAL DATA:  Lightheadedness and shortness of breath for 1 hour, chest pain, type II diabetes mellitus, morbid obesity, stage 2 chronic kidney disease, smoking, CHF  EXAM: CHEST  2 VIEW  COMPARISON:  12/28/2014  FINDINGS: Borderline enlargement of cardiac silhouette.  Slight pulmonary vascular congestion.  Mediastinal contours normal.  Lungs clear.  No pleural effusion or pneumothorax.  Bones unremarkable.  IMPRESSION: Borderline enlargement of cardiac silhouette with pulmonary vascular congestion.  No acute infiltrate.  Mild pulmonary edema seen on the previous exam resolved.   Electronically Signed   By: Lavonia Dana M.D.   On: 05/14/2015 15:04     Assessment/Plan Principal Problem:   Chest pain Active Problems:   HYPERTENSION, BENIGN ESSENTIAL   Diabetes mellitus type 2, uncontrolled   Morbid obesity   OSA (obstructive sleep apnea)   Acute-on-chronic kidney injury   CHF (congestive heart failure), NYHA class I   1. Chest Pain -will admit to telemetry -check serial enzymes appears by his chart to chronically run an elevated troponin -echo done on May 07, 2015 with EF of 20-25% -will need a cardiology consult called in am  2. HTN -will continue on antihypertensives -will monitor pressures  3. DM Type 2 uncontrolled -will start on FSBS -insulin will be continue and will add SSI coverage  4. Hyperlipidemia -will be continue on statins -f/u lipid panel  5. Sleep Apnea -CPAP as tolerated  6. Chronic Kidney disease III  -creatinine is slightly elevated from baseline will monitor -repeat labs in am  7. CHF Class II -will continue with hydralizine and nitrates and coreg -will continue with aldactone and demadex but monitor creatinine closely -he is on ACE and dose was increased in June would consider holding due to elevated creatinine after consultation with heart failure team     Code Status: Full Code (must indicate code status--if unknown or must be presumed, indicate so) DVT Prophylaxis:heparin Family Communication: none (indicate person spoken with, if applicable, with phone number if by telephone) Disposition Plan: home (indicate anticipated LOS)  Time spent: 19min  Velvet Moomaw A [facility] Pager [phone]

## 2015-05-14 NOTE — ED Provider Notes (Signed)
CSN: [id number]     Arrival date & time 05/14/15  1422 History   First MD Initiated Contact with Patient 05/14/15 1815     Chief Complaint  Patient presents with  . Chest Pain     (Consider location/radiation/quality/duration/timing/severity/associated sxs/prior Treatment) HPI    PCP: JEGEDE, OLUGBEMIGA, MD Blood pressure 134/105, pulse 81, temperature 98.2 F (36.8 C), temperature source Oral, resp. rate 19, SpO2 99 %.  [name] is a 39 y.o.male with a significant PMH of CHF, NSTEMI, anxiety, diabetes, uncontrolled hypertension, asthma OSA, obesity presents to the ER with complaints of chest pain. He was resting on the couch to watch TV when a strong pain when into his med sternum and radiated up into his throat. It caused significant anxiety for him and he started to hyperventilate, became diaphoretic and felt that he may loss consciousness. The pain lasted a total of 2 hours and eased off while in the waiting room without any interventions. He is allergic to aspirin, did not take Nitro or pain medications. He denies SOB, LE swelling, This pain is unlike any chest pain that he has had before, and not like his previous MI pain. His last stress   The patient denies diaphoresis, fever, headache, weakness (general or focal), confusion, change of vision,  neck pain, dysphagia, aphagia, chest pain, shortness of breath,  back pain, abdominal pains, nausea, vomiting, diarrhea, lower extremity swelling, rash.   Past Medical History  Diagnosis Date  . Hypertension   . Morbid obesity   . Presumed NICM     a. 04/2014 Myoview: EF 26%, glob HK, sev glob HK, ? prior infarct;  b. Never cathed 2/2 CKD.  [name] Chronic systolic CHF (congestive heart failure)     a. EF 20-25% in 2012. b. EF 45-50% in 10/2011 with nonischemic nuc - presumed NICM. c. 12/2014 Echo: Sev depressed LV fxn, sev dil LV, mild LVH, mild MR, sev dil LA, mildly reduced RV fxn.  . Nephrolithiasis   . OSA on CPAP   . High cholesterol    . Childhood asthma   . Type II diabetes mellitus   . Anxiety   . Renal cell carcinoma     a. s/p Rt robotic assisted partial converted to radical nephrectomy on 01/2013.  . Troponin level elevated     a. 04/2014, 12/2014: felt due to CHF.  . CKD (chronic kidney disease) stage 2, GFR 60-89 ml/min   . Aspirin allergy    Past Surgical History  Procedure Laterality Date  . Robotic assited partial nephrectomy Right 01/13/2013    Procedure: ROBOTIC ASSITED PARTIAL NEPHRECTOMY CONVERTED TO ROBOTIC ASSISTED RIGHT RADICAL NEPHRECTOMY;  Surgeon: Alexis Frock, MD;  Location: [facility];  Service: Urology;  Laterality: Right;  . Robotic assisted laparoscopic lysis of adhesion  01/13/2013    Procedure: ROBOTIC ASSISTED LAPAROSCOPIC LYSIS OF ADHESION EXTENSIVE;  Surgeon: Alexis Frock, MD;  Location: [facility];  Service: Urology;;  . Appendectomy  07/2004   Family History  Problem Relation Age of Onset  . Diabetes Other   . Hypertension Other   . Heart attack Neg Hx   . Stroke Neg Hx   . Hypertension Mother   . Diabetes Maternal Aunt    History  Substance Use Topics  . Smoking status: Former Smoker -- 0.25 packs/day for 22 years    Quit date: 04/16/2015  . Smokeless tobacco: Never Used  . Alcohol Use: Yes     Comment: occ     Review of  Systems  10 Systems reviewed and are negative for acute change except as noted in the HPI.     Allergies  Aspirin; Bee venom; and Tomato  Home Medications   Prior to Admission medications   Medication Sig Start Date End Date Taking? Authorizing Provider  acetaminophen (TYLENOL) 500 MG tablet Take 1,000 mg by mouth every 6 (six) hours as needed for mild pain.    Historical Provider, MD  allopurinol (ZYLOPRIM) 100 MG tablet Take 2 tablets (200 mg total) by mouth daily. 03/11/15   Jolaine Artist, MD  atorvastatin (LIPITOR) 80 MG tablet TAKE 1 TABLET (80 MG TOTAL) BY MOUTH EVERY EVENING. 10/25/14   Fay Records, MD  carvedilol (COREG) 6.25 MG tablet Take 3  tablets (18.75 mg total) by mouth 2 (two) times daily. 12/30/14   Ripudeep Krystal Eaton, MD  colchicine 0.6 MG tablet Take 1 tablet (0.6 mg total) by mouth daily. Patient not taking: Reported on 05/07/2015 12/30/14   Ripudeep Krystal Eaton, MD  EPINEPHrine (EPIPEN) 0.3 mg/0.3 mL SOAJ injection Inject 0.3 mLs (0.3 mg total) into the muscle as needed (as needed for severe allergy). 06/18/13   Ezequiel Essex, MD  hydrALAZINE (APRESOLINE) 25 MG tablet Take 1 tablet (25 mg total) by mouth 3 (three) times daily. 05/07/15   Larey Dresser, MD  insulin NPH-regular Human (NOVOLIN 70/30) (70-30) 100 UNIT/ML injection Inject 34 Units into the skin 2 (two) times daily with a meal. 05/07/15   Larey Dresser, MD  isosorbide mononitrate (IMDUR) 30 MG 24 hr tablet Take 1 tablet (30 mg total) by mouth daily. 01/22/15   Rogelia Mire, NP  lisinopril (PRINIVIL,ZESTRIL) 10 MG tablet Take 1 tablet (10 mg total) by mouth daily. 03/11/15   Jolaine Artist, MD  OVER THE COUNTER MEDICATION Take 1 tablet by mouth 3 (three) times daily.    Historical Provider, MD  spironolactone (ALDACTONE) 25 MG tablet Take 0.5 tablets (12.5 mg total) by mouth daily. 05/07/15   Larey Dresser, MD  torsemide (DEMADEX) 20 MG tablet Take 1 tablet (20 mg total) by mouth 2 (two) times daily. 12/30/14   Ripudeep K Rai, MD   BP 117/94 mmHg  Pulse 81  Temp(Src) 98.2 F (36.8 C) (Oral)  Resp 20  SpO2 98% Physical Exam  Constitutional: He appears well-developed and well-nourished. No distress.  HENT:  Head: Normocephalic and atraumatic.  Eyes: Pupils are equal, round, and reactive to light.  Neck: Normal range of motion. Neck supple.  Cardiovascular: Normal rate and regular rhythm.   Pulmonary/Chest: Effort normal and breath sounds normal. He exhibits no tenderness and no bony tenderness.  Abdominal: Soft. Bowel sounds are normal. He exhibits no distension. There is no tenderness. There is no guarding.  Musculoskeletal:  No lower extremity edema.   Neurological: He is alert.  Skin: Skin is warm and dry.  Nursing note and vitals reviewed.   ED Course  Procedures (including critical care time) Labs Review Labs Reviewed  BASIC METABOLIC PANEL - Abnormal; Notable for the following:    Glucose, Bld 161 (*)    Creatinine, Ser 1.76 (*)    GFR calc non Af Amer 47 (*)    GFR calc Af Amer 54 (*)    All other components within normal limits  CBC - Abnormal; Notable for the following:    RBC 5.89 (*)    MCV 77.1 (*)    RDW 16.3 (*)    All other components within normal limits  TROPONIN  I - Abnormal; Notable for the following:    Troponin I 0.04 (*)    All other components within normal limits  BRAIN NATRIURETIC PEPTIDE - Abnormal; Notable for the following:    B Natriuretic Peptide 559.1 (*)    All other components within normal limits  I-STAT TROPOININ, ED    Imaging Review Dg Chest 2 View  05/14/2015   CLINICAL DATA:  Lightheadedness and shortness of breath for 1 hour, chest pain, type II diabetes mellitus, morbid obesity, stage 2 chronic kidney disease, smoking, CHF  EXAM: CHEST  2 VIEW  COMPARISON:  12/28/2014  FINDINGS: Borderline enlargement of cardiac silhouette.  Slight pulmonary vascular congestion.  Mediastinal contours normal.  Lungs clear.  No pleural effusion or pneumothorax.  Bones unremarkable.  IMPRESSION: Borderline enlargement of cardiac silhouette with pulmonary vascular congestion.  No acute infiltrate.  Mild pulmonary edema seen on the previous exam resolved.   Electronically Signed   By: Lavonia Dana M.D.   On: 05/14/2015 15:04     EKG Interpretation   Date/Time:  Tuesday May 14 2015 14:26:15 EDT Ventricular Rate:  89 PR Interval:  162 QRS Duration: 90 QT Interval:  388 QTC Calculation: 472 R Axis:   92 Text Interpretation:  Normal sinus rhythm Biatrial enlargement Rightward  axis Abnormal ECG Nonspecific ST and T wave abnormality Confirmed by  Wyvonnia Dusky  MD, STEPHEN ([id number]) on 05/14/2015 6:31:06 PM       MDM   Final diagnoses:  Chest pain, unspecified chest pain type   Patient has an elevated BNP at 559. Troponin is 0.04 but this appears to be his baseline due to chronic heart failure BMP and CBC are not significantly changed from baseline. His chest xray shows improved pulmonary edema from last chest xray and some vascular congestion.  I spoke with cardiology regarding patients symptoms and mildly elevated Troponin. I discussed case with Dr. Wyvonnia Dusky as well, due to significant pain - hx of heart failure and diabetes admission for obs recommended. Patient is agreeable and understands reason for observation.  I spoke with Dr. Chancy Milroy from [place] who did not request holding orders and agreed to see patient to evaluate for obs vs admission.  Filed Vitals:   05/14/15 2000  BP: 117/94  Pulse: 81  Temp:   Resp:        Delos Haring, PA-C 05/14/15 2034  Ezequiel Essex, MD 05/15/15 [phone]

## 2015-05-14 NOTE — ED Notes (Signed)
Pt repots chest pain around 145 with shortness of breathe. Chest pain has subsided but patient still reports some shortness of breathe.

## 2015-05-15 ENCOUNTER — Observation Stay (HOSPITAL_COMMUNITY): Payer: Self-pay

## 2015-05-15 ENCOUNTER — Other Ambulatory Visit (HOSPITAL_COMMUNITY): Payer: Self-pay

## 2015-05-15 DIAGNOSIS — N179 Acute kidney failure, unspecified: Secondary | ICD-10-CM

## 2015-05-15 DIAGNOSIS — G4733 Obstructive sleep apnea (adult) (pediatric): Secondary | ICD-10-CM

## 2015-05-15 DIAGNOSIS — I420 Dilated cardiomyopathy: Secondary | ICD-10-CM

## 2015-05-15 DIAGNOSIS — R079 Chest pain, unspecified: Secondary | ICD-10-CM

## 2015-05-15 DIAGNOSIS — I10 Essential (primary) hypertension: Secondary | ICD-10-CM

## 2015-05-15 DIAGNOSIS — E1165 Type 2 diabetes mellitus with hyperglycemia: Secondary | ICD-10-CM

## 2015-05-15 DIAGNOSIS — I5042 Chronic combined systolic (congestive) and diastolic (congestive) heart failure: Secondary | ICD-10-CM

## 2015-05-15 DIAGNOSIS — N189 Chronic kidney disease, unspecified: Secondary | ICD-10-CM

## 2015-05-15 LAB — BASIC METABOLIC PANEL
ANION GAP: 8 (ref 5–15)
BUN: 22 mg/dL — AB (ref 6–20)
CALCIUM: 9.2 mg/dL (ref 8.9–10.3)
CO2: 27 mmol/L (ref 22–32)
Chloride: 105 mmol/L (ref 101–111)
Creatinine, Ser: 1.81 mg/dL — ABNORMAL HIGH (ref 0.61–1.24)
GFR calc Af Amer: 52 mL/min — ABNORMAL LOW (ref >60)
GFR calc non Af Amer: 45 mL/min — ABNORMAL LOW (ref >60)
Glucose, Bld: 153 mg/dL — ABNORMAL HIGH (ref 65–99)
POTASSIUM: 3.7 mmol/L (ref 3.5–5.1)
SODIUM: 140 mmol/L (ref 135–145)

## 2015-05-15 LAB — GLUCOSE, CAPILLARY
Glucose-Capillary: 124 mg/dL — ABNORMAL HIGH (ref 65–99)
Glucose-Capillary: 110 mg/dL — ABNORMAL HIGH (ref 65–99)

## 2015-05-15 LAB — TSH: TSH: 2.233 u[IU]/mL (ref 0.350–4.500)

## 2015-05-15 LAB — TROPONIN I
Troponin I: 0.04 ng/mL — ABNORMAL HIGH (ref <0.031)
Troponin I: 0.04 ng/mL — ABNORMAL HIGH (ref <0.031)
Troponin I: 0.03 ng/mL (ref <0.031)

## 2015-05-15 MED ORDER — HYDROMORPHONE HCL 1 MG/ML PO LIQD: 1.0000 mg | ORAL | Status: DC | PRN

## 2015-05-15 MED ORDER — ASPIRIN 81 MG PO CHEW
81.0000 mg | CHEWABLE_TABLET | ORAL | Status: AC
  Administered 2015-05-16: 81 mg via ORAL
  Filled 2015-05-15: qty 1

## 2015-05-15 MED ORDER — SODIUM CHLORIDE 0.9 % IV SOLN: 250.0000 mL | INTRAVENOUS | Status: DC | PRN

## 2015-05-15 MED ORDER — SODIUM CHLORIDE 0.9 % WEIGHT BASED INFUSION
1.0000 mL/kg/h | INTRAVENOUS | Status: DC
  Administered 2015-05-15 – 2015-05-16 (×2): 1 mL/kg/h via INTRAVENOUS

## 2015-05-15 MED ORDER — SODIUM CHLORIDE 0.9 % IJ SOLN: 3.0000 mL | INTRAMUSCULAR | Status: DC | PRN

## 2015-05-15 MED ORDER — SODIUM CHLORIDE 0.9 % IJ SOLN
3.0000 mL | Freq: Two times a day (BID) | INTRAMUSCULAR | Status: DC
  Administered 2015-05-15 – 2015-05-17 (×4): 3 mL via INTRAVENOUS

## 2015-05-15 MED ORDER — INSULIN ASPART 100 UNIT/ML ~~LOC~~ SOLN: 0.0000 [IU] | Freq: Every day | SUBCUTANEOUS | Status: DC

## 2015-05-15 MED ORDER — INSULIN ASPART 100 UNIT/ML ~~LOC~~ SOLN
0.0000 [IU] | Freq: Three times a day (TID) | SUBCUTANEOUS | Status: DC
  Administered 2015-05-15: 1 [IU] via SUBCUTANEOUS

## 2015-05-15 MED ORDER — HYDROMORPHONE HCL 1 MG/ML IJ SOLN
1.0000 mg | INTRAMUSCULAR | Status: DC | PRN
  Administered 2015-05-17 – 2015-05-18 (×4): 1 mg via INTRAVENOUS
  Filled 2015-05-15 (×5): qty 1

## 2015-05-15 MED ORDER — OXYCODONE-ACETAMINOPHEN 5-325 MG PO TABS
1.0000 | ORAL_TABLET | ORAL | Status: DC | PRN
  Administered 2015-05-15 – 2015-05-19 (×4): 2 via ORAL
  Filled 2015-05-15 (×5): qty 2

## 2015-05-15 NOTE — Progress Notes (Signed)
[facility]                                                                              Patient Demographics  Jeffrey [name], is a 39 y.o. male, DOB - [birth year], LPF:[id number]  Admit date - 05/14/2015   Admitting Physician Allyne Gee, MD  Outpatient Primary MD for the patient is Angelica Chessman, MD  LOS -    Chief Complaint  Patient presents with  . Chest Pain       Brief HPI   Jeffrey [name] is a 39 y.o. male with history of CHF HTN Chest pain NSTEMI DM Type 2 Uncontrolled presents to the ED with chest pain. Patient states that he started having central chest pain around 2PM on the day of admission, while he was just resting/sitting in the house. He had shortness of breath associated. He stated that there was no radiation of the pain. He denied having headache dizziness and did not pass out. He states that he has had an MI in the past and also has a history of CHF. By his chart he had an echo done which shows an EF of 20-25%. He is on maximal therapy with his CHF medications. He has no worsening of ankle edema noted. He has no abdominal pain. His CXR actually looks improved from prior films.   Assessment & Plan    Principal Problem:   Chest pain: Patient with multiple risk factors including prior CAD, CHF, EF of 20-25%, NSTEMI, concerning for unstable angina - Chest pain resolved at the time of my encounter. Troponins elevated at 0.04 with BNP of 559.1 - Cardiology consulted, recommending heart catheterization in a.m.  Active Problems: Nonischemic cardiomyopathy, chronic systolic CHF, EF 53-29% - Patient reported that he had EP appointment today for possibility of ICD. Hence cardiology consult requested. - Appreciate EP cardiology medications, cath in a.m.    HYPERTENSION, BENIGN ESSENTIAL - Currently stable, continue antihypertensives    Diabetes mellitus type 2, uncontrolled - Continue sliding scale insulin, follow hemoglobin A1c    OSA (obstructive  sleep apnea) -CPAP at night     Acute-on-chronic kidney injury - Patient will need gentle hydration prior to cardiac cath in a.m. - Hold ACE inhibitor today    CHF (congestive heart failure), NYHA class I - Currently stable, continue spironolactone, torsemide, cardiology following  Code Status: Full code   Family Communication: Discussed in detail with the patient, all imaging results, lab results explained to the patient   Disposition Plan: Not medically ready   Time Spent in minutes  25 minutes  Procedures    Consults   Cardiology  DVT Prophylaxis   heparin   Medications  Scheduled Meds: . allopurinol  100 mg Oral BID  . atorvastatin  80 mg Oral q1800  . carvedilol  18.75 mg Oral BID  . colchicine  0.6 mg Oral Daily  . heparin  5,000 Units Subcutaneous 3 times per day  . hydrALAZINE  25 mg Oral TID  . insulin aspart protamine- aspart  34 Units Subcutaneous BID WC  . isosorbide mononitrate  30 mg Oral Daily  . lisinopril  10  mg Oral Daily  . sodium chloride  3 mL Intravenous Q12H  . spironolactone  12.5 mg Oral Daily  . torsemide  20 mg Oral BID   Continuous Infusions:  PRN Meds:.sodium chloride, acetaminophen, ondansetron (ZOFRAN) IV, sodium chloride, zolpidem   Antibiotics   Anti-infectives    None        Subjective:   Jeffrey [name] was seen and examined today.  Patient denies dizziness, chest pain, shortness of breath, abdominal pain, N/V/D/C, new weakness, numbess, tingling. No acute events overnight.    Objective:   Blood pressure 111/89, pulse 61, temperature 98.1 F (36.7 C), temperature source Oral, resp. rate 16, height 5' 6.5" (1.689 m), weight 129.184 kg (284 lb 12.8 oz), SpO2 96 %.  Wt Readings from Last 3 Encounters:  05/15/15 129.184 kg (284 lb 12.8 oz)  05/07/15 131.09 kg (289 lb)  03/11/15 135.172 kg (298 lb)     Intake/Output Summary (Last 24 hours) at 05/15/15 1210 Last data filed at 05/15/15 0600  Gross per 24 hour  Intake     480 ml  Output   1675 ml  Net  -1195 ml    Exam  General: Alert and oriented x 3, NAD  HEENT:  PERRLA, EOMI, Anicteric Sclera, mucous membranes moist.   Neck: Supple, no JVD, no masses  CVS: S1 S2 auscultated, no rubs, murmurs or gallops. Regular rate and rhythm.  Respiratory: Clear to auscultation bilaterally, no wheezing, rales or rhonchi  Abdomen: obese, Soft, nontender, nondistended, + bowel sounds  Ext: no cyanosis clubbing or edema  Neuro: AAOx3, Cr N's II- XII. Strength 5/5 upper and lower extremities bilaterally  Skin: No rashes  Psych: Normal affect and demeanor, alert and oriented x3    Data Review   Micro Results No results found for this or any previous visit (from the past 240 hour(s)).  Radiology Reports Dg Chest 2 View  05/14/2015   CLINICAL DATA:  Lightheadedness and shortness of breath for 1 hour, chest pain, type II diabetes mellitus, morbid obesity, stage 2 chronic kidney disease, smoking, CHF  EXAM: CHEST  2 VIEW  COMPARISON:  12/28/2014  FINDINGS: Borderline enlargement of cardiac silhouette.  Slight pulmonary vascular congestion.  Mediastinal contours normal.  Lungs clear.  No pleural effusion or pneumothorax.  Bones unremarkable.  IMPRESSION: Borderline enlargement of cardiac silhouette with pulmonary vascular congestion.  No acute infiltrate.  Mild pulmonary edema seen on the previous exam resolved.   Electronically Signed   By: Lavonia Dana M.D.   On: 05/14/2015 15:04    CBC  Recent Labs Lab 05/14/15 1439  WBC 8.7  HGB 15.3  HCT 45.4  PLT 263  MCV 77.1*  MCH 26.0  MCHC 33.7  RDW 16.3*    Chemistries   Recent Labs Lab 05/14/15 1439 05/15/15 0406  NA 138 140  K 4.1 3.7  CL 107 105  CO2 22 27  GLUCOSE 161* 153*  BUN 19 22*  CREATININE 1.76* 1.81*  CALCIUM 9.4 9.2   ------------------------------------------------------------------------------------------------------------------ estimated creatinine clearance is 69.6 mL/min (by  C-G formula based on Cr of 1.81). ------------------------------------------------------------------------------------------------------------------ No results for input(s): HGBA1C in the last 72 hours. ------------------------------------------------------------------------------------------------------------------ No results for input(s): CHOL, HDL, LDLCALC, TRIG, CHOLHDL, LDLDIRECT in the last 72 hours. ------------------------------------------------------------------------------------------------------------------  Recent Labs  05/14/15 2320  TSH 2.233   ------------------------------------------------------------------------------------------------------------------ No results for input(s): VITAMINB12, FOLATE, FERRITIN, TIBC, IRON, RETICCTPCT in the last 72 hours.  Coagulation profile No results for input(s): INR, PROTIME in the last 168 hours.  No results for input(s): DDIMER in the last 72 hours.  Cardiac Enzymes  Recent Labs Lab 05/14/15 2320 05/15/15 0406 05/15/15 0953  TROPONINI 0.04* 0.04* 0.03   ------------------------------------------------------------------------------------------------------------------ Invalid input(s): POCBNP  No results for input(s): GLUCAP in the last 72 hours.   Jeramy Dimmick M.D. [facility] 05/15/2015, 12:10 PM  Pager: [phone] Between 7am to 7pm - call Pager - [phone]  After 7pm go to [email or web address] - password [email or web address]  Call night coverage person covering after 7pm

## 2015-05-15 NOTE — Consult Note (Addendum)
ELECTROPHYSIOLOGY CONSULT NOTE    Patient ID: Jeffrey [name] MRN: [id number], DOB/AGE: [birth year] 40 y.o.  Admit date: 05/14/2015 Date of Consult: 05/15/2015   Primary Physician: Angelica Chessman, MD Primary Cardiologist: Sherrill  Reason for Consultation: evaluate for ICD  HPI:  Jeffrey [name] is a 39 y.o. male with a past medical history significant for presumed NICM, hypertension, morbid obesity, OSA (on CPAP), hyperlipidemia, diabetes, prior renal cell cancer (s/p nephrectomy) and chronic kidney disease.  He has not previously undergone catheterization 2/2 renal insufficiency.  He presented to the hospital yesterday after an episode of chest pain.  Plans are for right and left heart cath tomorrow morning.  EP has been asked to evaluate for primary prevention ICD.   Echo this admission demonstrated EF 20-25%, diffuse hypokinesis, grade 2 diastolic dysfunction, LA 57.   He works as a Training and development officer at [employer] and reports compliance with medications and diet recommendations. He denies chest pain (prior to episode prior to admission), shortness of breath, LE edema, palpitations, recent fevers, chills, nausea or vomiting.   Past Medical History  Diagnosis Date  . Hypertension   . Morbid obesity   . Presumed NICM     a. 04/2014 Myoview: EF 26%, glob HK, sev glob HK, ? prior infarct;  b. Never cathed 2/2 CKD.  [name] Chronic systolic CHF (congestive heart failure)     a. EF 20-25% in 2012. b. EF 45-50% in 10/2011 with nonischemic nuc - presumed NICM. c. 12/2014 Echo: Sev depressed LV fxn, sev dil LV, mild LVH, mild MR, sev dil LA, mildly reduced RV fxn.  . Nephrolithiasis   . OSA on CPAP   . High cholesterol   . Childhood asthma   . Type II diabetes mellitus   . Anxiety   . Renal cell carcinoma     a. s/p Rt robotic assisted partial converted to radical nephrectomy on 01/2013.  . Troponin level elevated     a. 04/2014, 12/2014: felt due to CHF.  . CKD (chronic kidney disease) stage 2, GFR 60-89  ml/min   . Aspirin allergy      Surgical History:  Past Surgical History  Procedure Laterality Date  . Robotic assited partial nephrectomy Right 01/13/2013    Procedure: ROBOTIC ASSITED PARTIAL NEPHRECTOMY CONVERTED TO ROBOTIC ASSISTED RIGHT RADICAL NEPHRECTOMY;  Surgeon: Alexis Frock, MD;  Location: [facility];  Service: Urology;  Laterality: Right;  . Robotic assisted laparoscopic lysis of adhesion  01/13/2013    Procedure: ROBOTIC ASSISTED LAPAROSCOPIC LYSIS OF ADHESION EXTENSIVE;  Surgeon: Alexis Frock, MD;  Location: [facility];  Service: Urology;;  . Appendectomy  07/2004     Prescriptions prior to admission  Medication Sig Dispense Refill Last Dose  . allopurinol (ZYLOPRIM) 100 MG tablet Take 2 tablets (200 mg total) by mouth daily. (Patient taking differently: Take 100 mg by mouth 2 (two) times daily. ) 60 tablet 4 05/14/2015 at am  . atorvastatin (LIPITOR) 80 MG tablet TAKE 1 TABLET (80 MG TOTAL) BY MOUTH EVERY EVENING. (Patient taking differently: TAKE 1 TABLET (80 MG TOTAL) BY MOUTH DAILY) 30 tablet 2 05/14/2015 at Unknown time  . carvedilol (COREG) 6.25 MG tablet Take 3 tablets (18.75 mg total) by mouth 2 (two) times daily. 180 tablet 4 05/14/2015 at 1000  . hydrALAZINE (APRESOLINE) 25 MG tablet Take 1 tablet (25 mg total) by mouth 3 (three) times daily. 90 tablet 3 05/14/2015 at am  . insulin NPH-regular Human (NOVOLIN 70/30) (70-30) 100 UNIT/ML injection Inject 34  Units into the skin 2 (two) times daily with a meal. 4 vial 0 05/13/2015 at supper  . isosorbide mononitrate (IMDUR) 30 MG 24 hr tablet Take 1 tablet (30 mg total) by mouth daily. 30 tablet 11 05/14/2015 at Unknown time  . lisinopril (PRINIVIL,ZESTRIL) 10 MG tablet Take 1 tablet (10 mg total) by mouth daily. 30 tablet 6 05/14/2015 at Unknown time  . OVER THE COUNTER MEDICATION Take 1 tablet by mouth 2 (two) times daily as needed (gout attacks). Herbal medication "Go out"   3 weeks ago  . spironolactone (ALDACTONE) 25 MG tablet Take 0.5  tablets (12.5 mg total) by mouth daily. 15 tablet 3 05/14/2015 at Unknown time  . torsemide (DEMADEX) 20 MG tablet Take 1 tablet (20 mg total) by mouth 2 (two) times daily. 60 tablet 3 05/14/2015 at am    Inpatient Medications:  . allopurinol  100 mg Oral BID  . atorvastatin  80 mg Oral q1800  . carvedilol  18.75 mg Oral BID  . colchicine  0.6 mg Oral Daily  . heparin  5,000 Units Subcutaneous 3 times per day  . hydrALAZINE  25 mg Oral TID  . insulin aspart protamine- aspart  34 Units Subcutaneous BID WC  . isosorbide mononitrate  30 mg Oral Daily  . lisinopril  10 mg Oral Daily  . sodium chloride  3 mL Intravenous Q12H  . spironolactone  12.5 mg Oral Daily  . torsemide  20 mg Oral BID    Allergies:  Allergies  Allergen Reactions  . Aspirin Shortness Of Breath, Itching and Rash     Burning sensation (Patient reports he tolerates other NSAIDS)   . Bee Venom Hives and Swelling  . Tomato Rash    Social History   Social History  . Marital Status: Single    Spouse Name: N/A  . Number of Children: N/A  . Years of Education: N/A   Occupational History  . Not on file.   Social History Main Topics  . Smoking status: Former Smoker -- 0.25 packs/day for 22 years    Quit date: 04/16/2015  . Smokeless tobacco: Never Used  . Alcohol Use: Yes     Comment: occ   . Drug Use: Yes    Special: Marijuana  . Sexual Activity: Yes    Birth Control/ Protection: Condom   Other Topics Concern  . Not on file   Social History Narrative     Family History  Problem Relation Age of Onset  . Heart attack Neg Hx   . Stroke Neg Hx   . Hypertension Mother   . Diabetes Maternal Aunt      Review of Systems: All other systems reviewed and are otherwise negative except as noted above.  Physical Exam: Filed Vitals:   05/14/15 2325 05/15/15 0132 05/15/15 0500 05/15/15 1030  BP: 150/95  112/82 111/89  Pulse: 76 75 71 61  Temp: 98.5 F (36.9 C)  98.1 F (36.7 C)   TempSrc: Oral  Oral     Resp:      Height:      Weight:   284 lb 12.8 oz (129.184 kg)   SpO2: 94% 96% 97% 96%    GEN- The patient is obese appearing, alert and oriented x 3 today.   HEENT: normocephalic, atraumatic; sclera clear, conjunctiva pink; hearing intact; oropharynx clear; neck supple  Lungs- Clear to ausculation bilaterally, normal work of breathing.  No wheezes, rales, rhonchi Heart- Regular rate and rhythm  GI- soft, non-tender,  non-distended, bowel sounds present  Extremities- no clubbing, cyanosis, or edema; DP/PT/radial pulses 2+ bilaterally MS- no significant deformity or atrophy Skin- warm and dry, no rash or lesion Psych- euthymic mood, full affect Neuro- strength and sensation are intact  Labs:   Lab Results  Component Value Date   WBC 8.7 05/14/2015   HGB 15.3 05/14/2015   HCT 45.4 05/14/2015   MCV 77.1* 05/14/2015   PLT 263 05/14/2015    Recent Labs Lab 05/15/15 0406  NA 140  K 3.7  CL 105  CO2 27  BUN 22*  CREATININE 1.81*  CALCIUM 9.2  GLUCOSE 153*      Radiology/Studies: Dg Chest 2 View 05/14/2015   CLINICAL DATA:  Lightheadedness and shortness of breath for 1 hour, chest pain, type II diabetes mellitus, morbid obesity, stage 2 chronic kidney disease, smoking, CHF  EXAM: CHEST  2 VIEW  COMPARISON:  12/28/2014  FINDINGS: Borderline enlargement of cardiac silhouette.  Slight pulmonary vascular congestion.  Mediastinal contours normal.  Lungs clear.  No pleural effusion or pneumothorax.  Bones unremarkable.  IMPRESSION: Borderline enlargement of cardiac silhouette with pulmonary vascular congestion.  No acute infiltrate.  Mild pulmonary edema seen on the previous exam resolved.   Electronically Signed   By: Lavonia Dana M.D.   On: 05/14/2015 15:04    ULA:GTXMI rhythm, rate 89, RAD, non specific STT changes   TELEMETRY: sinus rhythm with PVC's  Assessment/Plan: 1.  Presumed non ischemic cardiomyopathy The patient has a longstanding presumed non ischemic cardiomyopathy  despite guideline directed therapy.  He is scheduled for right and left heart cath tomorrow to further define cardiac anatomy and filling pressures.  If no CAD amenable to revascularization, would recommend ICD implant for primary prevention. With young age and renal insufficiency, would prefer to place S-ICD.  Borden Thune have patient screened for S-ICD today.  If he does not qualify, Adekunle Rohrbach plan single chamber ICD implant.  Both procedures discussed with the patient by Dr Curt Bears today. Timing of ICD implant Fia Hebert depend on cath findings tomorrow and anesthesia availability.   2.  Chest pain/unstable angina For cath tomorrow per primary team  3.  HTN Stable No change required today   Signed, Chanetta Marshall, NP 05/15/2015 11:15 AM  Saw and examined the patient today with Chanetta Marshall.  Patient has heart failure with EF 20-25%.  Interested in [place].  To be screened for SICD and if rules out [place] get transvenous system.  Getting cardiac cath tomorrow and Beckey Polkowski make final decision on ICD post cath.  Patient would like to get this done this admission, Bunnie Rehberg eval post cath.    Liberta Gimpel Curt Bears, MD 05/15/2015 4:00 PM

## 2015-05-15 NOTE — Progress Notes (Signed)
  Echocardiogram 2D Echocardiogram has been performed.  Donata Clay 05/15/2015, 2:47 PM

## 2015-05-15 NOTE — Consult Note (Signed)
Reason for Consult: Chest pain at rest  Requesting Physician: Jeffrey [name]  Cardiologist: Jeffrey [name]  HPI: This is a 39 y.o. male with a past medical history significant for cardiomyopathy of undetermined etiology, presumably nonischemic, as well as chronic kidney disease stage 3, type 2 diabetes mellitus, morbid obesity, obstructive sleep apnea on CPAP and history of previous resection of renal cell carcinoma in 2014.  Initial diagnosis of his cardiomyopathy was in 2012 when his left ventricular ejection fraction was 20-25 percent. Due to renal insufficiency, coronary angiography was not performed as a nuclear stress test showed normal pattern of perfusion. Repeat evaluation with nuclear perfusion study showed EF of 26% and normal perfusion in July 2015. He has never undergone coronary angiography. Most recent functional study was a resting echocardiogram performed on August 2 that still shows ejection fraction of 20-25 percent with global hypokinesis. He has been referred for discussion of ICD implantation with an appointment that is scheduled for early next week.  Yesterday afternoon he had sudden on heralded onset of severe chest tightness at rest, with pain radiating towards his throat. He was lying down in bed. His most recent meal was roughly 3 hours earlier. The discomfort was very different from the burning sensation that he has experienced with previous gastroesophageal reflux. The pain was intense and associated dyspnea and diaphoresis, anxiety and hyperventilation. He did not have palpitations or syncope, but had near syncope (he thinks since he was breathing so fast).. The symptoms gradually abated over a period of roughly 2 hours. He has never experienced similar chest tightness in the past.  He denies orthopnea or paroxysmal nocturnal dyspnea and his exertional dyspnea is at baseline (functional class II). His weight is stable. He does not have any edema. He is lying completely flat in  bed and looks very comfortable today. There is also no evidence of acute heart failure by x-ray. He is compliant with sodium restriction and weighs himself regularly.  His electrocardiogram shows a nonspecific intraventricular conduction delay, mild right axis deviation and ST depression and T-wave inversion in lead V6 and 3, findings which are similar to March 2016 but with repolarization abnormalities are new compared to 2014 and 2015. Cardiac troponin I has been borderline elevated in a "plateau" pattern at 0.04. This is typical for this patient. BNP is moderately elevated at 559 (was as high as 1066 in March, down to 133 in June).  He underwent chest CT angiography yesterday and received 80 mL of iodinated contrast intravenously. There was no evidence of pulmonary embolism. There is cardiomegaly, but no evidence of congestive heart failure and no evidence of calcification in the coronary artery territory. His creatinine is 1.8, close to his baseline (GFR around 50, CK-MB stage III).  PMHx:  Past Medical History  Diagnosis Date  . Hypertension   . Morbid obesity   . Presumed NICM     a. 04/2014 Myoview: EF 26%, glob HK, sev glob HK, ? prior infarct;  b. Never cathed 2/2 CKD.  [name] Chronic systolic CHF (congestive heart failure)     a. EF 20-25% in 2012. b. EF 45-50% in 10/2011 with nonischemic nuc - presumed NICM. c. 12/2014 Echo: Sev depressed LV fxn, sev dil LV, mild LVH, mild MR, sev dil LA, mildly reduced RV fxn.  . Nephrolithiasis   . OSA on CPAP   . High cholesterol   . Childhood asthma   . Type II diabetes mellitus   . Anxiety   . Renal  cell carcinoma     a. s/p Rt robotic assisted partial converted to radical nephrectomy on 01/2013.  . Troponin level elevated     a. 04/2014, 12/2014: felt due to CHF.  . CKD (chronic kidney disease) stage 2, GFR 60-89 ml/min   . Aspirin allergy    Past Surgical History  Procedure Laterality Date  . Robotic assited partial nephrectomy Right 01/13/2013     Procedure: ROBOTIC ASSITED PARTIAL NEPHRECTOMY CONVERTED TO ROBOTIC ASSISTED RIGHT RADICAL NEPHRECTOMY;  Surgeon: Jeffrey Frock, MD;  Location: [facility];  Service: Urology;  Laterality: Right;  . Robotic assisted laparoscopic lysis of adhesion  01/13/2013    Procedure: ROBOTIC ASSISTED LAPAROSCOPIC LYSIS OF ADHESION EXTENSIVE;  Surgeon: Jeffrey Frock, MD;  Location: [facility];  Service: Urology;;  . Appendectomy  07/2004    FAMHx: Family History  Problem Relation Age of Onset  . Heart attack Neg Hx   . Stroke Neg Hx   . Hypertension Mother   . Diabetes Maternal Aunt     SOCHx:  reports that he quit smoking about 4 weeks ago. He has never used smokeless tobacco. He reports that he drinks alcohol. He reports that he uses illicit drugs (Marijuana).  ALLERGIES: Allergies  Allergen Reactions  . Aspirin Shortness Of Breath, Itching and Rash     Burning sensation (Patient reports he tolerates other NSAIDS)   . Bee Venom Hives and Swelling  . Tomato Rash    ROS: Pertinent items are noted in HPI.  HOME MEDICATIONS: Prescriptions prior to admission  Medication Sig Dispense Refill Last Dose  . allopurinol (ZYLOPRIM) 100 MG tablet Take 2 tablets (200 mg total) by mouth daily. (Patient taking differently: Take 100 mg by mouth 2 (two) times daily. ) 60 tablet 4 05/14/2015 at am  . atorvastatin (LIPITOR) 80 MG tablet TAKE 1 TABLET (80 MG TOTAL) BY MOUTH EVERY EVENING. (Patient taking differently: TAKE 1 TABLET (80 MG TOTAL) BY MOUTH DAILY) 30 tablet 2 05/14/2015 at Unknown time  . carvedilol (COREG) 6.25 MG tablet Take 3 tablets (18.75 mg total) by mouth 2 (two) times daily. 180 tablet 4 05/14/2015 at 1000  . hydrALAZINE (APRESOLINE) 25 MG tablet Take 1 tablet (25 mg total) by mouth 3 (three) times daily. 90 tablet 3 05/14/2015 at am  . insulin NPH-regular Human (NOVOLIN 70/30) (70-30) 100 UNIT/ML injection Inject 34 Units into the skin 2 (two) times daily with a meal. 4 vial 0 05/13/2015 at supper  .  isosorbide mononitrate (IMDUR) 30 MG 24 hr tablet Take 1 tablet (30 mg total) by mouth daily. 30 tablet 11 05/14/2015 at Unknown time  . lisinopril (PRINIVIL,ZESTRIL) 10 MG tablet Take 1 tablet (10 mg total) by mouth daily. 30 tablet 6 05/14/2015 at Unknown time  . OVER THE COUNTER MEDICATION Take 1 tablet by mouth 2 (two) times daily as needed (gout attacks). Herbal medication "Go out"   3 weeks ago  . spironolactone (ALDACTONE) 25 MG tablet Take 0.5 tablets (12.5 mg total) by mouth daily. 15 tablet 3 05/14/2015 at Unknown time  . torsemide (DEMADEX) 20 MG tablet Take 1 tablet (20 mg total) by mouth 2 (two) times daily. 60 tablet 3 05/14/2015 at am    HOSPITAL MEDICATIONS: I have reviewed the patient's current medications. Scheduled: . allopurinol  100 mg Oral BID  . atorvastatin  80 mg Oral q1800  . carvedilol  18.75 mg Oral BID  . colchicine  0.6 mg Oral Daily  . heparin  5,000 Units Subcutaneous 3 times  per day  . hydrALAZINE  25 mg Oral TID  . insulin aspart protamine- aspart  34 Units Subcutaneous BID WC  . isosorbide mononitrate  30 mg Oral Daily  . lisinopril  10 mg Oral Daily  . sodium chloride  3 mL Intravenous Q12H  . spironolactone  12.5 mg Oral Daily  . torsemide  20 mg Oral BID    VITALS: Blood pressure 111/89, pulse 61, temperature 98.1 F (36.7 C), temperature source Oral, resp. rate 16, height 5' 6.5" (1.689 m), weight 284 lb 12.8 oz (129.184 kg), SpO2 96 %.  PHYSICAL EXAM:  General: Alert, oriented x3, no distress, morbid obesity limits some aspects of his exam Head: no evidence of trauma, PERRL, EOMI, no exophtalmos or lid lag, no myxedema, no xanthelasma; normal ears, nose and oropharynx Neck: Probably normal jugular venous pulsations and no hepatojugular reflux; brisk carotid pulses without delay and no carotid bruits Chest: clear to auscultation, no signs of consolidation by percussion or palpation, normal fremitus, symmetrical and full respiratory  excursions Cardiovascular: Unable to identify apical impulse, regular rhythm, normal first heart sound and  Normal second heart sound, no rubs or gallop, no  murmur Abdomen: no tenderness or distention, no masses by palpation, no abnormal pulsatility or arterial bruits, normal bowel sounds, no hepatosplenomegaly Extremities: no clubbing, cyanosis;  no edema; 2+ radial, ulnar and brachial pulses bilaterally; 2+ right femoral, posterior tibial and dorsalis pedis pulses; 2+ left femoral, posterior tibial and dorsalis pedis pulses; no subclavian or femoral bruits Neurological: grossly nonfocal   LABS  CBC  Recent Labs  05/14/15 1439  WBC 8.7  HGB 15.3  HCT 45.4  MCV 77.1*  PLT 300   Basic Metabolic Panel  Recent Labs  05/14/15 1439 05/15/15 0406  NA 138 140  K 4.1 3.7  CL 107 105  CO2 22 27  GLUCOSE 161* 153*  BUN 19 22*  CREATININE 1.76* 1.81*  CALCIUM 9.4 9.2   Liver Function Tests No results for input(s): AST, ALT, ALKPHOS, BILITOT, PROT, ALBUMIN in the last 72 hours. No results for input(s): LIPASE, AMYLASE in the last 72 hours. Cardiac Enzymes  Recent Labs  05/14/15 2320 05/15/15 0406 05/15/15 0953  TROPONINI 0.04* 0.04* 0.03   BNP Invalid input(s): POCBNP D-Dimer No results for input(s): DDIMER in the last 72 hours. Hemoglobin A1C No results for input(s): HGBA1C in the last 72 hours. Fasting Lipid Panel No results for input(s): CHOL, HDL, LDLCALC, TRIG, CHOLHDL, LDLDIRECT in the last 72 hours. Thyroid Function Tests  Recent Labs  05/14/15 2320  TSH 2.233      IMAGING: Dg Chest 2 View  05/14/2015   CLINICAL DATA:  Lightheadedness and shortness of breath for 1 hour, chest pain, type II diabetes mellitus, morbid obesity, stage 2 chronic kidney disease, smoking, CHF  EXAM: CHEST  2 VIEW  COMPARISON:  12/28/2014  FINDINGS: Borderline enlargement of cardiac silhouette.  Slight pulmonary vascular congestion.  Mediastinal contours normal.  Lungs clear.  No  pleural effusion or pneumothorax.  Bones unremarkable.  IMPRESSION: Borderline enlargement of cardiac silhouette with pulmonary vascular congestion.  No acute infiltrate.  Mild pulmonary edema seen on the previous exam resolved.   Electronically Signed   By: Lavonia Dana M.D.   On: 05/14/2015 15:04    ECG:  Sinus rhythm, nonspecific IVCD (minor), right axis deviation, ST depression and T-wave inversion in leads III and V6  TELEMETRY: NSR  IMPRESSION:  Mr. Jurgens has chest pain syndrome that could well be described  as unstable angina pectoris. The differential diagnosis includes ignored coronary abnormalities (that might also explain his cardiomyopathy) versus ventricular tachycardia (although the discomfort was still ongoing when he presented to the emergency room and his vital signs were reported as normal).  His cardiomyopathy has not improved despite good medical therapy. He does have risk factors for coronary artery disease despite his young age. Before a decision is made regarding implantation of a defibrillator, I think it would be important to make a definitive diagnosis regarding possible ischemic cause of his cardiomyopathy. He should undergo coronary angiography. Would like to delay this for another 24 hours since he received a contrast bolus for his CT chest, has moderately severe chronic kidney disease and diabetes mellitus, placing him at risk for contrast-induced nephrotoxicity.   RECOMMENDATION: Discussed with Dr. [name], scheduled for right or left heart catheterization tomorrow, start hydration overnight. He asked whether we could expedite his EP evaluation while he is here, a reasonable request. He is anxious to "get the ICD over with".   Time Spent Directly with Patient: 60 minutes  Sanda Klein, MD, [facility] [phone] office [phone] pager   05/15/2015, 10:56 AM

## 2015-05-15 NOTE — Progress Notes (Signed)
Asked pt about ASA allergy because scheduled for 81mg  ASA pre-cath. Pt said he does take a baby ASA everyday at home

## 2015-05-16 LAB — BASIC METABOLIC PANEL
ANION GAP: 8 (ref 5–15)
BUN: 24 mg/dL — ABNORMAL HIGH (ref 6–20)
CALCIUM: 9 mg/dL (ref 8.9–10.3)
CHLORIDE: 106 mmol/L (ref 101–111)
CO2: 26 mmol/L (ref 22–32)
CREATININE: 1.97 mg/dL — AB (ref 0.61–1.24)
GFR calc Af Amer: 47 mL/min — ABNORMAL LOW (ref >60)
GFR calc non Af Amer: 41 mL/min — ABNORMAL LOW (ref >60)
Glucose, Bld: 97 mg/dL (ref 65–99)
Potassium: 3.9 mmol/L (ref 3.5–5.1)
Sodium: 140 mmol/L (ref 135–145)

## 2015-05-16 LAB — HEMOGLOBIN A1C
Hgb A1c MFr Bld: 6.5 % — ABNORMAL HIGH (ref 4.8–5.6)
Mean Plasma Glucose: 140 mg/dL

## 2015-05-16 LAB — PROTIME-INR
INR: 1.04 (ref 0.00–1.49)
Prothrombin Time: 13.8 seconds (ref 11.6–15.2)

## 2015-05-16 LAB — GLUCOSE, CAPILLARY
GLUCOSE-CAPILLARY: 107 mg/dL — AB (ref 65–99)
GLUCOSE-CAPILLARY: 103 mg/dL — AB (ref 65–99)
Glucose-Capillary: 107 mg/dL — ABNORMAL HIGH (ref 65–99)
Glucose-Capillary: 115 mg/dL — ABNORMAL HIGH (ref 65–99)

## 2015-05-16 LAB — MAGNESIUM: Magnesium: 1.8 mg/dL (ref 1.7–2.4)

## 2015-05-16 MED ORDER — TORSEMIDE 20 MG PO TABS
10.0000 mg | ORAL_TABLET | Freq: Every day | ORAL | Status: DC
  Administered 2015-05-16 – 2015-05-19 (×3): 10 mg via ORAL
  Filled 2015-05-16 (×3): qty 1

## 2015-05-16 MED ORDER — ALLOPURINOL 100 MG PO TABS
100.0000 mg | ORAL_TABLET | Freq: Every day | ORAL | Status: DC | PRN
  Administered 2015-05-18 – 2015-05-19 (×2): 100 mg via ORAL
  Filled 2015-05-16 (×2): qty 1

## 2015-05-16 MED ORDER — CEFAZOLIN SODIUM 10 G IJ SOLR
3.0000 g | INTRAMUSCULAR | Status: DC
  Filled 2015-05-16: qty 3000

## 2015-05-16 MED ORDER — SODIUM CHLORIDE 0.9 % IR SOLN
80.0000 mg | Status: AC
  Administered 2015-05-17: 80 mg
  Filled 2015-05-16: qty 2

## 2015-05-16 MED ORDER — CHLORHEXIDINE GLUCONATE 4 % EX LIQD: 60.0000 mL | Freq: Once | CUTANEOUS | Status: DC

## 2015-05-16 MED ORDER — SODIUM CHLORIDE 0.9 % IV SOLN
INTRAVENOUS | Status: DC
  Administered 2015-05-16 (×2): via INTRAVENOUS

## 2015-05-16 MED ORDER — CHLORHEXIDINE GLUCONATE 4 % EX LIQD
60.0000 mL | Freq: Once | CUTANEOUS | Status: AC
  Administered 2015-05-16: 4 via TOPICAL
  Filled 2015-05-16: qty 60

## 2015-05-16 MED FILL — Perflutren Lipid Microsphere IV Susp 1.1 MG/ML: INTRAVENOUS | Qty: 10 | Status: AC

## 2015-05-16 NOTE — Care Management Note (Signed)
Case Management Note  Patient Details  Name: Jeffrey [name] MRN: [id number] Date of Birth: [birth year]  Subjective/Objective:  Pt admitted for cp. Per MD notes: His creatinine is too high for cardiac catheterization. Creatinine 1.91. Plan to hold his ace inhibitors and his spironolactone and reduce his torsemide. Anticipation of cardiac catheterization tomorrow.                Action/Plan: Transitional Care Clinic Liaison Carmela Hurt did speak with pt in regards to hospital f/u in the clinic. Appointment scheduled and to be placed on AVS. Pt is without insurance- he will get medication assistance at the clinic as well. No further needs from CM at this time.    Expected Discharge Date:                  Expected Discharge Plan:  Home/Self Care  In-House Referral:     Discharge planning Services  CM Consult, Follow-up appt scheduled, [place] Acute Care Choice:  NA Choice offered to:  NA  DME Arranged:  N/A DME Agency:  NA  HH Arranged:  NA HH Agency:  NA  Status of Service:  Completed, signed off  Medicare Important Message Given:    Date Medicare IM Given:    Medicare IM give by:    Date Additional Medicare IM Given:    Additional Medicare Important Message give by:     If discussed at [place] of Stay Meetings, dates discussed:    Additional Comments:  Bethena Roys, RN 05/16/2015, 11:25 AM

## 2015-05-16 NOTE — Progress Notes (Signed)
Pt refuse his CPAP for tonight, pt did state that he uses it at home but wanted to Not wear it tonight.

## 2015-05-16 NOTE — Hospital Discharge Follow-Up (Signed)
Transitional Care Clinic Care Coordination Note:  Admit date:  05/14/15 Discharge date: TBD  Discharge Disposition: Home when stable Patient contact: [phone] (mobile) Emergency contact(s): Faith Henry-mother [phone]); [name] Patterson-Fiance [phone])  This Case Manager reviewed patient's EMR and determined patient would benefit from post-discharge medical management and chronic care management services through the [facility]. Patient has a history of congestive heart failure, hypertension, NSTEMI, uncontrolled type 2 diabetes mellitus who presented to ED on 05/14/15 for chest pain. This Case Manager met with patient to discuss the services and medical management that can be provided at the [facility]. Patient verbalized understanding and agreed to receive post-discharge care at the [facility].   Patient scheduled for Transitional Care appointment on 05/21/15 at 1530 with Dr. Jarold Song.  Clinic information and appointment time provided to patient. Appointment information also placed on AVS.  Assessment:       Home Environment: Patient lives with a friend in a private residence.       Support System: Patient indicates he has a strong support system, and that friends, his mother, and fiance provide needed social and emotional support.       Level of functioning: independent       Home DME: CPAP at night for OSA. Patient also indicates he has a glucometer and diabetes supplies.       Home care services: none       Transportation: Patient indicates he usually takes the bus or relies on friends or family members for transportation to appointments.        Food/Nutrition: Patient indicates he has access to the food he needs.        Medications: Patient indicates he typically uses [facility] on [facility] for medications. He indicates he has been unable to afford Novolog 70/30 and colchicine and is completely out of these medications.   Discussed pharmacy resources available at [place].  Patient indicates he plans to use [facility] after discharge. Spoke with Orland Dec, Pharmacy Tech, and she indicated [facility] pharmacy currently has samples available of Novolog 70/30 and a few samples available of colchicine. Again, reiterated [facility] pharmacy resources to patient who verbalized understanding.        Identified Barriers: self-pay-no insurance. Informed patient of that there are on-site Financial Counselors at [place] and provided patient with Development worker, community walk-in hours. Patient also indicates he has applied for Medicaid in the past but was denied; however, he plans to reapply since he has had recent life changes due to his health. Lack of prescription coverage is an additional barrier. [facility] pharmacy resources discussed thoroughly        PCP: Dr. Doreene Burke   Arranged services:        Services communicated to Jacqlyn Krauss, RN CM

## 2015-05-16 NOTE — Progress Notes (Signed)
Patient stated that he would put the CPAP on later tonight. RT notified patient to call if he needs assistance with setting it up and/or putting it on.

## 2015-05-16 NOTE — Progress Notes (Signed)
[facility]                                                                              Patient Demographics  Jeffrey [name], is a 39 y.o. male, DOB - 05-12-1976, [id number]  Admit date - 05/14/2015   Admitting Physician Allyne Gee, MD  Outpatient Primary MD for the patient is Angelica Chessman, MD  LOS -    Chief Complaint  Patient presents with  . Chest Pain       Brief HPI   Jeffrey [name] is a 39 y.o. male with history of CHF HTN Chest pain NSTEMI DM Type 2 Uncontrolled presents to the ED with chest pain. Patient states that he started having central chest pain around 2PM on the day of admission, while he was just resting/sitting in the house. He had shortness of breath associated. He stated that there was no radiation of the pain. He denied having headache dizziness and did not pass out. He states that he has had an MI in the past and also has a history of CHF. By his chart he had an echo done which shows an EF of 20-25%. He is on maximal therapy with his CHF medications. He has no worsening of ankle edema noted. He has no abdominal pain. His CXR actually looks improved from prior films.   Assessment & Plan    Principal Problem:   Chest pain: Patient with multiple risk factors including prior CAD, CHF, EF of 20-25%, NSTEMI, concerning for unstable angina - Chest pain resolved at the time of my encounter. Troponins elevated at 0.04 with BNP of 559.1 - Cardiology consulted, cardiac cath canceled today due to creatinine of 1.9  Active Problems: Nonischemic cardiomyopathy, chronic systolic CHF, EF 69-48% - Cardiology following, planning cardiac cath in a.m.    HYPERTENSION, BENIGN ESSENTIAL - Currently stable, continue antihypertensives    Diabetes mellitus type 2, uncontrolled - Continue sliding scale insulin, hemoglobin A1c 6.5     OSA (obstructive sleep apnea) -CPAP at night     Acute-on-chronic kidney injury - Cardiac cath canceled due to elevated  creatinine, cardiology holding spironolactone and torsemide today. ACE inhibitor is already on hold.    CHF (congestive heart failure), NYHA class I - Currently stable, continue spironolactone, torsemide, cardiology following  Code Status: Full code   Family Communication: Discussed in detail with the patient, all imaging results, lab results explained to the patient   Disposition Plan: Not medically ready   Time Spent in minutes  25 minutes  Procedures    Consults   Cardiology  DVT Prophylaxis   heparin   Medications  Scheduled Meds: . allopurinol  100 mg Oral BID  . atorvastatin  80 mg Oral q1800  . carvedilol  18.75 mg Oral BID  . colchicine  0.6 mg Oral Daily  . heparin  5,000 Units Subcutaneous 3 times per day  . hydrALAZINE  25 mg Oral TID  . insulin aspart  0-5 Units Subcutaneous QHS  . insulin aspart  0-9 Units Subcutaneous TID WC  . insulin aspart protamine- aspart  34 Units Subcutaneous BID WC  . isosorbide mononitrate  30 mg Oral Daily  . sodium chloride  3 mL Intravenous Q12H  . sodium chloride  3 mL Intravenous Q12H  . torsemide  10 mg Oral Daily   Continuous Infusions: . sodium chloride 50 mL/hr at 05/16/15 1148  . sodium chloride Stopped (05/16/15 1045)   PRN Meds:.sodium chloride, sodium chloride, acetaminophen, HYDROmorphone (DILAUDID) injection, ondansetron (ZOFRAN) IV, oxyCODONE-acetaminophen, sodium chloride, sodium chloride, zolpidem   Antibiotics   Anti-infectives    None        Subjective:   Jeffrey [name] was seen and examined today. No acute issues, no chest pain, shortness of breath. Patient denies dizziness, abdominal pain, N/V/D/C, new weakness, numbess, tingling. No acute events overnight.    Objective:   Blood pressure 129/93, pulse 63, temperature 98.4 F (36.9 C), temperature source Oral, resp. rate 16, height 5' 6.5" (1.689 m), weight 130.455 kg (287 lb 9.6 oz), SpO2 95 %.  Wt Readings from Last 3 Encounters:  05/16/15  130.455 kg (287 lb 9.6 oz)  05/07/15 131.09 kg (289 lb)  03/11/15 135.172 kg (298 lb)     Intake/Output Summary (Last 24 hours) at 05/16/15 1316 Last data filed at 05/16/15 0500  Gross per 24 hour  Intake    840 ml  Output    925 ml  Net    -85 ml    Exam  General: Alert and oriented x 3, NAD  HEENT:  PERRLA, EOMI, Anicteric Sclera, mucous membranes moist.   Neck: Supple, no JVD, no masses  CVS: S1 S2 auscultated, no rubs, murmurs or gallops. Regular rate and rhythm.  Respiratory: Clear to auscultation bilaterally, no wheezing, rales or rhonchi  Abdomen: obese, Soft, nontender, nondistended, + bowel sounds  Ext: no cyanosis clubbing or edema  Neuro: AAOx3, Cr N's II- XII. Strength 5/5 upper and lower extremities bilaterally  Skin: No rashes  Psych: Normal affect and demeanor, alert and oriented x3    Data Review   Micro Results No results found for this or any previous visit (from the past 240 hour(s)).  Radiology Reports Dg Chest 2 View  05/14/2015   CLINICAL DATA:  Lightheadedness and shortness of breath for 1 hour, chest pain, type II diabetes mellitus, morbid obesity, stage 2 chronic kidney disease, smoking, CHF  EXAM: CHEST  2 VIEW  COMPARISON:  12/28/2014  FINDINGS: Borderline enlargement of cardiac silhouette.  Slight pulmonary vascular congestion.  Mediastinal contours normal.  Lungs clear.  No pleural effusion or pneumothorax.  Bones unremarkable.  IMPRESSION: Borderline enlargement of cardiac silhouette with pulmonary vascular congestion.  No acute infiltrate.  Mild pulmonary edema seen on the previous exam resolved.   Electronically Signed   By: Lavonia Dana M.D.   On: 05/14/2015 15:04    CBC  Recent Labs Lab 05/14/15 1439  WBC 8.7  HGB 15.3  HCT 45.4  PLT 263  MCV 77.1*  MCH 26.0  MCHC 33.7  RDW 16.3*    Chemistries   Recent Labs Lab 05/14/15 1439 05/15/15 0406 05/16/15 0335  NA 138 140 140  K 4.1 3.7 3.9  CL 107 105 106  CO2 22 27 26    GLUCOSE 161* 153* 97  BUN 19 22* 24*  CREATININE 1.76* 1.81* 1.97*  CALCIUM 9.4 9.2 9.0  MG  --   --  1.8   ------------------------------------------------------------------------------------------------------------------ estimated creatinine clearance is 64.3 mL/min (by C-G formula based on Cr of 1.97). ------------------------------------------------------------------------------------------------------------------  Recent Labs  05/15/15 1339  HGBA1C 6.5*   ------------------------------------------------------------------------------------------------------------------ No results for input(s): CHOL,  HDL, LDLCALC, TRIG, CHOLHDL, LDLDIRECT in the last 72 hours. ------------------------------------------------------------------------------------------------------------------  Recent Labs  05/14/15 2320  TSH 2.233   ------------------------------------------------------------------------------------------------------------------ No results for input(s): VITAMINB12, FOLATE, FERRITIN, TIBC, IRON, RETICCTPCT in the last 72 hours.  Coagulation profile  Recent Labs Lab 05/16/15 0335  INR 1.04    No results for input(s): DDIMER in the last 72 hours.  Cardiac Enzymes  Recent Labs Lab 05/14/15 2320 05/15/15 0406 05/15/15 0953  TROPONINI 0.04* 0.04* 0.03   ------------------------------------------------------------------------------------------------------------------ Invalid input(s): POCBNP   Recent Labs  05/15/15 1751 05/15/15 2143 05/16/15 0730 05/16/15 1147  GLUCAP 124* 110* 107* 115*     Makaylyn Sinyard M.D. [facility] 05/16/2015, 1:16 PM  Pager: [phone] Between 7am to 7pm - call Pager - [phone]  After 7pm go to [email or web address] - password [email or web address]  Call night coverage person covering after 7pm

## 2015-05-16 NOTE — Progress Notes (Signed)
Cath postponed 2/2 renal function today - rescheduled for tomorrow morning.  ICD implant is scheduled for tomorrow as well depending on results of cath.  Will place orders for ICD implant and tentatively plan on implant tomorrow pending cath results.   Will discuss with patient further prior to cath in the morning  Chanetta Marshall, NP 05/16/2015 5:27 PM

## 2015-05-16 NOTE — Progress Notes (Addendum)
Patient Name: Jeffrey [name] Date of Encounter: 05/16/2015   SUBJECTIVE  Feels better. Denies chest pain, sob or palpitation.   CURRENT MEDS . allopurinol  100 mg Oral BID  . atorvastatin  80 mg Oral q1800  . carvedilol  18.75 mg Oral BID  . colchicine  0.6 mg Oral Daily  . heparin  5,000 Units Subcutaneous 3 times per day  . hydrALAZINE  25 mg Oral TID  . insulin aspart  0-5 Units Subcutaneous QHS  . insulin aspart  0-9 Units Subcutaneous TID WC  . insulin aspart protamine- aspart  34 Units Subcutaneous BID WC  . isosorbide mononitrate  30 mg Oral Daily  . sodium chloride  3 mL Intravenous Q12H  . sodium chloride  3 mL Intravenous Q12H  . spironolactone  12.5 mg Oral Daily  . torsemide  20 mg Oral BID    OBJECTIVE  Filed Vitals:   05/15/15 1646 05/15/15 1851 05/15/15 2008 05/16/15 0500  BP: 101/63 112/66 114/73 113/78  Pulse: 73  80 63  Temp:   98.1 F (36.7 C) 98.4 F (36.9 C)  TempSrc:   Oral Oral  Resp:      Height:      Weight:    287 lb 9.6 oz (130.455 kg)  SpO2: 92%  94% 95%    Intake/Output Summary (Last 24 hours) at 05/16/15 0857 Last data filed at 05/16/15 0500  Gross per 24 hour  Intake    840 ml  Output    925 ml  Net    -85 ml   Filed Weights   05/14/15 2303 05/15/15 0500 05/16/15 0500  Weight: 289 lb 3.2 oz (131.18 kg) 284 lb 12.8 oz (129.184 kg) 287 lb 9.6 oz (130.455 kg)    PHYSICAL EXAM  General: Pleasant, NAD. Neuro: Alert and oriented X 3. Moves all extremities spontaneously. Psych: Normal affect. HEENT:  Normal  Neck: Supple without bruits or  JVD. Lungs:  Resp regular and unlabored, CTA. Heart: RRR no s3, s4, or murmurs. Abdomen: Soft, non-tender, non-distended, BS + x 4.  Extremities: No clubbing, cyanosis or edema. DP/PT/Radials 2+ and equal bilaterally.  Accessory Clinical Findings  CBC  Recent Labs  05/14/15 1439  WBC 8.7  HGB 15.3  HCT 45.4  MCV 77.1*  PLT 235   Basic Metabolic Panel  Recent Labs   05/15/15 0406 05/16/15 0335  NA 140 140  K 3.7 3.9  CL 105 106  CO2 27 26  GLUCOSE 153* 97  BUN 22* 24*  CREATININE 1.81* 1.97*  CALCIUM 9.2 9.0    Recent Labs  05/14/15 2320 05/15/15 0406 05/15/15 0953  TROPONINI 0.04* 0.04* 0.03   BNP Invalid input(s): POCBNP D-Dimer No results for input(s): DDIMER in the last 72 hours. Hemoglobin A1C  Recent Labs  05/15/15 1339  HGBA1C 6.5*   Fasting Lipid Panel No results for input(s): CHOL, HDL, LDLCALC, TRIG, CHOLHDL, LDLDIRECT in the last 72 hours. Thyroid Function Tests  Recent Labs  05/14/15 2320  TSH 2.233    TELE  NSR  Radiology/Studies  Dg Chest 2 View  05/14/2015   CLINICAL DATA:  Lightheadedness and shortness of breath for 1 hour, chest pain, type II diabetes mellitus, morbid obesity, stage 2 chronic kidney disease, smoking, CHF  EXAM: CHEST  2 VIEW  COMPARISON:  12/28/2014  FINDINGS: Borderline enlargement of cardiac silhouette.  Slight pulmonary vascular congestion.  Mediastinal contours normal.  Lungs clear.  No pleural effusion or pneumothorax.  Bones unremarkable.  IMPRESSION: Borderline enlargement of cardiac silhouette with pulmonary vascular congestion.  No acute infiltrate.  Mild pulmonary edema seen on the previous exam resolved.   Electronically Signed   By: Lavonia Dana M.D.   On: 05/14/2015 15:04   Echo 8/10 LV EF: 20% -  25%  ------------------------------------------------------------------- Indications:   Chest pain 786.51.  ------------------------------------------------------------------- History:  PMH: Hypertension, benign, essential. Type 2 diabetes, uncontrolled. Morbid obesity. Congestive heart failure. Risk factors: Dyslipidemia.  ------------------------------------------------------------------- Study Conclusions  - Left ventricle: The cavity size was moderately dilated. Wall thickness was normal. Systolic function was severely reduced. The estimated ejection fraction  was in the range of 20% to 25%. Severe diffuse hypokinesis with no identifiable regional variations. Doppler parameters are consistent with abnormal left ventricular relaxation (grade 1 diastolic dysfunction). Indeterminate mean left atrial pressure.  ASSESSMENT AND PLAN  1. Chest pain: Concerning for unstable angina pectoris. Trop 0.04->0.04->0.03. Plan was for cath today, however got cancelled due to increasing creatinine. Gentle hydration. NPO after midnight, now plan for cath tomorrow.  - Continue ASA, BB, statin, heparin, imdur 2. Acute-on-chronic kidney injury: s/p right nephrectomy. Creatinine further increase to 1.91. Discontinued ACE. Can discontinue spironolactone.  3. Chronic CHF (congestive heart failure), NYHA class I: BNP of 559.1, EF 20-25%. Seen by EP - will make decision on ICD after cath today.  4.  HYPERTENSION, BENIGN ESSENTIAL - stable  5. Diabetes mellitus type 2, uncontrolled - on SSI, HgbA1c 6.5 6. Morbid obesity 7. OSA (obstructive sleep apnea) - on CPAP 8. 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 NSVT- had a 6 beats of VT at 0302. Asymptomatic. K normal. No further episode. Will add magnesium.    Jarrett Soho PA-C Pager [phone]  Agree with above assessment.  The patient is not experiencing any significant dyspnea at this time.  His creatinine is too high for cardiac catheterization.  Creatinine 1.91.  We will hold his ace inhibitors and his spironolactone and reduce his torsemide today.  Anticipate cardiac catheterization tomorrow.  Lungs are clear on auscultation and chest x-ray shows resolution of prior pulmonary edema.

## 2015-05-16 NOTE — Progress Notes (Signed)
Reported creatinine on 1.97 to Dr. [name], no new orders received.

## 2015-05-16 NOTE — Progress Notes (Signed)
Spoke with Dr. [name], Cardiac Cath cancelled for today, Creat 1.97. Patient diet ordered

## 2015-05-16 NOTE — Progress Notes (Signed)
Pt had 6 beats VTach at 0302. Dr Radford Pax notified and orders received

## 2015-05-17 ENCOUNTER — Inpatient Hospital Stay (HOSPITAL_COMMUNITY): Payer: MEDICAID | Admitting: Anesthesiology

## 2015-05-17 ENCOUNTER — Inpatient Hospital Stay (HOSPITAL_COMMUNITY): Payer: Self-pay | Admitting: Anesthesiology

## 2015-05-17 ENCOUNTER — Encounter (HOSPITAL_COMMUNITY)
Admission: EM | Disposition: A | Discharge status: Home / Self Care | Payer: Self-pay | Source: Home / Self Care | Attending: Internal Medicine

## 2015-05-17 ENCOUNTER — Encounter (HOSPITAL_COMMUNITY): Payer: Self-pay | Admitting: Internal Medicine

## 2015-05-17 DIAGNOSIS — I5023 Acute on chronic systolic (congestive) heart failure: Secondary | ICD-10-CM | POA: Insufficient documentation

## 2015-05-17 DIAGNOSIS — R0789 Other chest pain: Secondary | ICD-10-CM

## 2015-05-17 DIAGNOSIS — I429 Cardiomyopathy, unspecified: Secondary | ICD-10-CM

## 2015-05-17 DIAGNOSIS — R079 Chest pain, unspecified: Secondary | ICD-10-CM | POA: Insufficient documentation

## 2015-05-17 HISTORY — PX: CARDIAC CATHETERIZATION: SHX172

## 2015-05-17 HISTORY — PX: EP IMPLANTABLE DEVICE: SHX172B

## 2015-05-17 LAB — POCT I-STAT 3, VENOUS BLOOD GAS (G3P V)
ACID-BASE DEFICIT: 2 mmol/L (ref 0.0–2.0)
Acid-base deficit: 3 mmol/L — ABNORMAL HIGH (ref 0.0–2.0)
BICARBONATE: 23.6 meq/L (ref 20.0–24.0)
Bicarbonate: 24.1 mEq/L — ABNORMAL HIGH (ref 20.0–24.0)
O2 Saturation: 64 %
O2 Saturation: 65 %
PH VEN: 7.32 — AB (ref 7.250–7.300)
PO2 VEN: 36 mmHg (ref 30.0–45.0)
TCO2: 25 mmol/L (ref 0–100)
TCO2: 25 mmol/L (ref 0–100)
pCO2, Ven: 45.8 mmHg (ref 45.0–50.0)
pCO2, Ven: 45.9 mmHg (ref 45.0–50.0)
pH, Ven: 7.328 — ABNORMAL HIGH (ref 7.250–7.300)
pO2, Ven: 36 mmHg (ref 30.0–45.0)

## 2015-05-17 LAB — POCT I-STAT 3, ART BLOOD GAS (G3+)
Acid-base deficit: 4 mmol/L — ABNORMAL HIGH (ref 0.0–2.0)
Bicarbonate: 21.1 mEq/L (ref 20.0–24.0)
O2 Saturation: 95 %
TCO2: 22 mmol/L (ref 0–100)
pCO2 arterial: 37.3 mmHg (ref 35.0–45.0)
pH, Arterial: 7.361 (ref 7.350–7.450)
pO2, Arterial: 77 mmHg — ABNORMAL LOW (ref 80.0–100.0)

## 2015-05-17 LAB — BASIC METABOLIC PANEL
ANION GAP: 5 (ref 5–15)
BUN: 23 mg/dL — AB (ref 6–20)
CO2: 25 mmol/L (ref 22–32)
CREATININE: 1.81 mg/dL — AB (ref 0.61–1.24)
Calcium: 9 mg/dL (ref 8.9–10.3)
Chloride: 107 mmol/L (ref 101–111)
GFR calc Af Amer: 52 mL/min — ABNORMAL LOW (ref >60)
GFR calc non Af Amer: 45 mL/min — ABNORMAL LOW (ref >60)
Glucose, Bld: 93 mg/dL (ref 65–99)
Potassium: 4.2 mmol/L (ref 3.5–5.1)
Sodium: 137 mmol/L (ref 135–145)

## 2015-05-17 LAB — CBC
HCT: 44.4 % (ref 39.0–52.0)
Hemoglobin: 14.5 g/dL (ref 13.0–17.0)
MCH: 25.2 pg — ABNORMAL LOW (ref 26.0–34.0)
MCHC: 32.7 g/dL (ref 30.0–36.0)
MCV: 77.2 fL — AB (ref 78.0–100.0)
PLATELETS: 261 10*3/uL (ref 150–400)
RBC: 5.75 MIL/uL (ref 4.22–5.81)
RDW: 16.2 % — ABNORMAL HIGH (ref 11.5–15.5)
WBC: 13.9 10*3/uL — ABNORMAL HIGH (ref 4.0–10.5)

## 2015-05-17 LAB — MAGNESIUM: MAGNESIUM: 1.8 mg/dL (ref 1.7–2.4)

## 2015-05-17 LAB — GLUCOSE, CAPILLARY
Glucose-Capillary: 94 mg/dL (ref 65–99)
Glucose-Capillary: 101 mg/dL — ABNORMAL HIGH (ref 65–99)
Glucose-Capillary: 101 mg/dL — ABNORMAL HIGH (ref 65–99)

## 2015-05-17 LAB — CREATININE, SERUM
Creatinine, Ser: 1.74 mg/dL — ABNORMAL HIGH (ref 0.61–1.24)
GFR calc Af Amer: 55 mL/min — ABNORMAL LOW (ref >60)
GFR calc non Af Amer: 47 mL/min — ABNORMAL LOW (ref >60)

## 2015-05-17 SURGERY — SUBQ ICD IMPLANT
Anesthesia: General

## 2015-05-17 SURGERY — RIGHT/LEFT HEART CATH AND CORONARY ANGIOGRAPHY
Anesthesia: LOCAL

## 2015-05-17 MED ORDER — HEPARIN SODIUM (PORCINE) 1000 UNIT/ML IJ SOLN
INTRAMUSCULAR | Status: AC
  Filled 2015-05-17: qty 1

## 2015-05-17 MED ORDER — SODIUM CHLORIDE 0.9 % IR SOLN
Status: AC
  Filled 2015-05-17: qty 2

## 2015-05-17 MED ORDER — LIDOCAINE HCL (CARDIAC) 20 MG/ML IV SOLN
INTRAVENOUS | Status: DC | PRN
  Administered 2015-05-17: 80 mg via INTRAVENOUS

## 2015-05-17 MED ORDER — ETOMIDATE 2 MG/ML IV SOLN
INTRAVENOUS | Status: DC | PRN
  Administered 2015-05-17: 20 mg via INTRAVENOUS

## 2015-05-17 MED ORDER — MIDAZOLAM HCL 2 MG/2ML IJ SOLN
INTRAMUSCULAR | Status: DC | PRN
  Administered 2015-05-17: 2 mg via INTRAVENOUS

## 2015-05-17 MED ORDER — HEPARIN (PORCINE) IN NACL 2-0.9 UNIT/ML-% IJ SOLN
INTRAMUSCULAR | Status: DC | PRN
  Administered 2015-05-17: 09:00:00

## 2015-05-17 MED ORDER — BUPIVACAINE HCL (PF) 0.25 % IJ SOLN
INTRAMUSCULAR | Status: AC
  Filled 2015-05-17: qty 30

## 2015-05-17 MED ORDER — PHENOL 1.4 % MT LIQD
1.0000 | OROMUCOSAL | Status: DC | PRN
  Administered 2015-05-18: 1 via OROMUCOSAL
  Filled 2015-05-17: qty 177

## 2015-05-17 MED ORDER — DEXTROSE 5 % IV SOLN
10.0000 mg | INTRAVENOUS | Status: DC | PRN
Start: 2015-05-17 — End: 2015-05-17
  Administered 2015-05-17: 25 ug/min via INTRAVENOUS

## 2015-05-17 MED ORDER — HEPARIN (PORCINE) IN NACL 2-0.9 UNIT/ML-% IJ SOLN
INTRAMUSCULAR | Status: AC
  Filled 2015-05-17: qty 1000

## 2015-05-17 MED ORDER — CEFAZOLIN SODIUM 1-5 GM-% IV SOLN
1.0000 g | Freq: Four times a day (QID) | INTRAVENOUS | Status: AC
  Administered 2015-05-17 – 2015-05-18 (×2): 1 g via INTRAVENOUS
  Filled 2015-05-17 (×3): qty 50

## 2015-05-17 MED ORDER — SODIUM CHLORIDE 0.9 % IV SOLN: 250.0000 mL | INTRAVENOUS | Status: DC | PRN

## 2015-05-17 MED ORDER — CEFAZOLIN SODIUM 1-5 GM-% IV SOLN
INTRAVENOUS | Status: AC
  Filled 2015-05-17: qty 50

## 2015-05-17 MED ORDER — ONDANSETRON HCL 4 MG/2ML IJ SOLN
INTRAMUSCULAR | Status: AC
  Filled 2015-05-17: qty 2

## 2015-05-17 MED ORDER — ONDANSETRON HCL 4 MG/2ML IJ SOLN
INTRAMUSCULAR | Status: DC | PRN
  Administered 2015-05-17: 4 mg via INTRAVENOUS

## 2015-05-17 MED ORDER — MIDAZOLAM HCL 2 MG/2ML IJ SOLN
INTRAMUSCULAR | Status: AC
  Filled 2015-05-17: qty 4

## 2015-05-17 MED ORDER — ONDANSETRON HCL 4 MG/2ML IJ SOLN
4.0000 mg | Freq: Four times a day (QID) | INTRAMUSCULAR | Status: DC | PRN
  Administered 2015-05-18 – 2015-05-19 (×3): 4 mg via INTRAVENOUS
  Filled 2015-05-17 (×3): qty 2

## 2015-05-17 MED ORDER — PROMETHAZINE HCL 25 MG/ML IJ SOLN
6.2500 mg | INTRAMUSCULAR | Status: DC | PRN
  Filled 2015-05-17: qty 1

## 2015-05-17 MED ORDER — HEPARIN (PORCINE) IN NACL 2-0.9 UNIT/ML-% IJ SOLN
INTRAMUSCULAR | Status: DC | PRN
  Administered 2015-05-17: 13:00:00

## 2015-05-17 MED ORDER — FENTANYL CITRATE (PF) 100 MCG/2ML IJ SOLN: 25.0000 ug | INTRAMUSCULAR | Status: DC | PRN

## 2015-05-17 MED ORDER — SODIUM CHLORIDE 0.9 % IV SOLN: INTRAVENOUS | Status: AC

## 2015-05-17 MED ORDER — ONDANSETRON HCL 4 MG/2ML IJ SOLN
4.0000 mg | Freq: Four times a day (QID) | INTRAMUSCULAR | Status: DC | PRN
  Administered 2015-05-17: 4 mg via INTRAVENOUS

## 2015-05-17 MED ORDER — CEFAZOLIN SODIUM-DEXTROSE 2-3 GM-% IV SOLR
INTRAVENOUS | Status: AC
  Filled 2015-05-17: qty 50

## 2015-05-17 MED ORDER — GLYCOPYRROLATE 0.2 MG/ML IJ SOLN
INTRAMUSCULAR | Status: DC | PRN
  Administered 2015-05-17 (×2): .2 mg via INTRAVENOUS

## 2015-05-17 MED ORDER — HEPARIN SODIUM (PORCINE) 5000 UNIT/ML IJ SOLN: 5000.0000 [IU] | Freq: Three times a day (TID) | INTRAMUSCULAR | Status: DC

## 2015-05-17 MED ORDER — SODIUM CHLORIDE 0.9 % IJ SOLN: 3.0000 mL | INTRAMUSCULAR | Status: DC | PRN

## 2015-05-17 MED ORDER — ACETAMINOPHEN 325 MG PO TABS: 325.0000 mg | ORAL_TABLET | ORAL | Status: DC | PRN

## 2015-05-17 MED ORDER — VERAPAMIL HCL 2.5 MG/ML IV SOLN
INTRAVENOUS | Status: DC | PRN
  Administered 2015-05-17: 08:00:00 via INTRA_ARTERIAL

## 2015-05-17 MED ORDER — ACETAMINOPHEN 325 MG PO TABS: 650.0000 mg | ORAL_TABLET | ORAL | Status: DC | PRN

## 2015-05-17 MED ORDER — FUROSEMIDE 10 MG/ML IJ SOLN
INTRAMUSCULAR | Status: AC
  Filled 2015-05-17: qty 8

## 2015-05-17 MED ORDER — CALCIUM CHLORIDE 10 % IV SOLN
INTRAVENOUS | Status: DC | PRN
  Administered 2015-05-17 (×2): 100 mg via INTRAVENOUS
  Administered 2015-05-17: 200 mg via INTRAVENOUS

## 2015-05-17 MED ORDER — FENTANYL CITRATE (PF) 250 MCG/5ML IJ SOLN
INTRAMUSCULAR | Status: DC | PRN
  Administered 2015-05-17 (×2): 100 ug via INTRAVENOUS
  Administered 2015-05-17: 50 ug via INTRAVENOUS

## 2015-05-17 MED ORDER — MIDAZOLAM HCL 5 MG/5ML IJ SOLN
INTRAMUSCULAR | Status: DC | PRN
  Administered 2015-05-17: 1 mg via INTRAVENOUS

## 2015-05-17 MED ORDER — SODIUM CHLORIDE 0.9 % IJ SOLN
3.0000 mL | Freq: Two times a day (BID) | INTRAMUSCULAR | Status: DC
  Administered 2015-05-18 (×2): 3 mL via INTRAVENOUS

## 2015-05-17 MED ORDER — FUROSEMIDE 10 MG/ML IJ SOLN
INTRAMUSCULAR | Status: DC | PRN
  Administered 2015-05-17: 80 mg via INTRAVENOUS

## 2015-05-17 MED ORDER — BUPIVACAINE HCL (PF) 0.25 % IJ SOLN
INTRAMUSCULAR | Status: DC | PRN
  Administered 2015-05-17: 60 mL
  Administered 2015-05-17: 30 mL

## 2015-05-17 MED ORDER — LIDOCAINE HCL 4 % MT SOLN
OROMUCOSAL | Status: DC | PRN
  Administered 2015-05-17: 4 mL via TOPICAL

## 2015-05-17 MED ORDER — SUCCINYLCHOLINE CHLORIDE 20 MG/ML IJ SOLN
INTRAMUSCULAR | Status: DC | PRN
  Administered 2015-05-17: 120 mg via INTRAVENOUS

## 2015-05-17 MED ORDER — PHENYLEPHRINE HCL 10 MG/ML IJ SOLN
INTRAMUSCULAR | Status: DC | PRN
  Administered 2015-05-17: 80 ug via INTRAVENOUS
  Administered 2015-05-17: 120 ug via INTRAVENOUS

## 2015-05-17 MED ORDER — FENTANYL CITRATE (PF) 250 MCG/5ML IJ SOLN
INTRAMUSCULAR | Status: AC
Start: 2015-05-17 — End: 2015-05-17
  Filled 2015-05-17: qty 5

## 2015-05-17 MED ORDER — LIDOCAINE HCL (PF) 1 % IJ SOLN
INTRAMUSCULAR | Status: AC
  Filled 2015-05-17: qty 30

## 2015-05-17 MED ORDER — MAGNESIUM SULFATE 50 % IJ SOLN
3.0000 g | Freq: Once | INTRAVENOUS | Status: AC
  Administered 2015-05-17: 3 g via INTRAVENOUS
  Filled 2015-05-17: qty 6

## 2015-05-17 MED ORDER — FENTANYL CITRATE (PF) 100 MCG/2ML IJ SOLN
INTRAMUSCULAR | Status: DC | PRN
  Administered 2015-05-17: 25 ug via INTRAVENOUS

## 2015-05-17 MED ORDER — EPHEDRINE SULFATE 50 MG/ML IJ SOLN
INTRAMUSCULAR | Status: DC | PRN
  Administered 2015-05-17: 20 mg via INTRAVENOUS
  Administered 2015-05-17 (×2): 10 mg via INTRAVENOUS
  Administered 2015-05-17: 30 mg via INTRAVENOUS
  Administered 2015-05-17: 10 mg via INTRAVENOUS
  Administered 2015-05-17: 20 mg via INTRAVENOUS

## 2015-05-17 MED ORDER — SODIUM CHLORIDE 0.9 % IV SOLN
INTRAVENOUS | Status: DC | PRN
  Administered 2015-05-17: 10 mL/h via INTRAVENOUS

## 2015-05-17 MED ORDER — IOHEXOL 350 MG/ML SOLN
INTRAVENOUS | Status: DC | PRN
  Administered 2015-05-17: 30 mL via INTRA_ARTERIAL

## 2015-05-17 MED ORDER — FENTANYL CITRATE (PF) 100 MCG/2ML IJ SOLN
INTRAMUSCULAR | Status: AC
  Filled 2015-05-17: qty 4

## 2015-05-17 MED ORDER — ALBUMIN HUMAN 5 % IV SOLN
INTRAVENOUS | Status: DC | PRN
Start: 2015-05-17 — End: 2015-05-17
  Administered 2015-05-17: 14:00:00 via INTRAVENOUS

## 2015-05-17 MED ORDER — HEPARIN SODIUM (PORCINE) 1000 UNIT/ML IJ SOLN
INTRAMUSCULAR | Status: DC | PRN
  Administered 2015-05-17: 5000 [IU] via INTRAVENOUS

## 2015-05-17 MED ORDER — VERAPAMIL HCL 2.5 MG/ML IV SOLN
INTRAVENOUS | Status: AC
  Filled 2015-05-17: qty 2

## 2015-05-17 SURGICAL SUPPLY — 19 items
CATH BALLN WEDGE 5F 110CM (CATHETERS) ×2 IMPLANT
CATH INFINITI 5 FR JL3.5 (CATHETERS) ×1 IMPLANT
CATH INFINITI 5FR ANG PIGTAIL (CATHETERS) ×2 IMPLANT
CATH INFINITI 5FR MULTPACK ANG (CATHETERS) IMPLANT
CATH INFINITI JR4 5F (CATHETERS) ×2 IMPLANT
CATH SWAN GANZ 7F STRAIGHT (CATHETERS) IMPLANT
DEVICE RAD COMP TR BAND LRG (VASCULAR PRODUCTS) ×2 IMPLANT
GLIDESHEATH SLEND SS 6F .021 (SHEATH) ×2 IMPLANT
KIT HEART LEFT (KITS) ×2 IMPLANT
KIT HEART RIGHT NAMIC (KITS) ×2 IMPLANT
PACK CARDIAC CATHETERIZATION (CUSTOM PROCEDURE TRAY) ×2 IMPLANT
SHEATH FAST CATH BRACH 5F 5CM (SHEATH) ×2 IMPLANT
SHEATH PINNACLE 5F 10CM (SHEATH) IMPLANT
SHEATH PINNACLE 7F 10CM (SHEATH) IMPLANT
SYR MEDRAD MARK V 150ML (SYRINGE) ×2 IMPLANT
TRANSDUCER W/STOPCOCK (MISCELLANEOUS) ×4 IMPLANT
TUBING CIL FLEX 10 FLL-RA (TUBING) ×2 IMPLANT
WIRE EMERALD 3MM-J .035X150CM (WIRE) IMPLANT
WIRE SAFE-T 1.5MM-J .035X260CM (WIRE) ×2 IMPLANT

## 2015-05-17 SURGICAL SUPPLY — 7 items
ATTRACTOMAT 16X20 MAGNETIC DRP (DRAPES) ×2 IMPLANT
BLANKET WARM UNDERBOD FULL ACC (MISCELLANEOUS) ×2 IMPLANT
HEMOSTAT SURGICEL 2X4 FIBR (HEMOSTASIS) ×2 IMPLANT
ICD SUBQ EMBLEM S-ICD 3401 (Lead) ×2 IMPLANT
ICD SUBQ EMBLEM S-PULSE A209 (ICD Generator) ×2 IMPLANT
PAD DEFIB LIFELINK (PAD) ×4 IMPLANT
TRAY PACEMAKER INSERTION (CUSTOM PROCEDURE TRAY) ×2 IMPLANT

## 2015-05-17 NOTE — Progress Notes (Signed)
Paged Cards Fellow for Lab results. K+ at 4.2, Mag at 1.8. Orders for Mag replacement given

## 2015-05-17 NOTE — Progress Notes (Signed)
Left radial a line removed by Tammy Mink. Pressure x 10 min. Site level zero. Clear dressing applied.Complete at 1515.

## 2015-05-17 NOTE — Progress Notes (Signed)
SUBJECTIVE: The patient is doing well today.  At this time, he denies chest pain, shortness of breath, or any new concerns.  He is pending left and right heart catheterization this morning.  Plan SICD post cath if no need for revascularization.   CURRENT MEDICATIONS: . [MAR Hold] atorvastatin  80 mg Oral q1800  . [MAR Hold] carvedilol  18.75 mg Oral BID  .  ceFAZolin (ANCEF) IV  3 g Intravenous To Cath  . chlorhexidine  60 mL Topical Once  . [MAR Hold] colchicine  0.6 mg Oral Daily  . gentamicin irrigation  80 mg Irrigation To Cath  . [MAR Hold] heparin  5,000 Units Subcutaneous 3 times per day  . [MAR Hold] hydrALAZINE  25 mg Oral TID  . [MAR Hold] insulin aspart  0-5 Units Subcutaneous QHS  . [MAR Hold] insulin aspart  0-9 Units Subcutaneous TID WC  . [MAR Hold] insulin aspart protamine- aspart  34 Units Subcutaneous BID WC  . [MAR Hold] isosorbide mononitrate  30 mg Oral Daily  . [MAR Hold] sodium chloride  3 mL Intravenous Q12H  . sodium chloride  3 mL Intravenous Q12H  . [MAR Hold] torsemide  10 mg Oral Daily   . sodium chloride 50 mL/hr at 05/16/15 1536  . sodium chloride 10 mL/hr (05/17/15 0749)  . sodium chloride Stopped (05/16/15 1045)    OBJECTIVE: Physical Exam: Filed Vitals:   05/17/15 0457 05/17/15 0500 05/17/15 0748 05/17/15 0804  BP:  117/90    Pulse:  76    Temp:  98.4 F (36.9 C)    TempSrc:  Oral    Resp:  18    Height: 5\' 7"  (1.702 m)     Weight: 292 lb 6.4 oz (132.632 kg)     SpO2:  100% 94% 96%    Intake/Output Summary (Last 24 hours) at 05/17/15 0818 Last data filed at 05/16/15 1900  Gross per 24 hour  Intake 756.67 ml  Output   1600 ml  Net -843.33 ml    Telemetry reveals sinus rhythm with runs of NSVT  GEN- The patient is obese appearing, alert and oriented x 3 today.   Head- normocephalic, atraumatic Eyes-  Sclera clear, conjunctiva pink Ears- hearing intact Oropharynx- clear Neck- supple  Lungs- Clear to ausculation  bilaterally, normal work of breathing Heart- Regular rate and rhythm, no murmurs, rubs or gallops  GI- soft, NT, ND, + BS Extremities- no clubbing, cyanosis, or edema Skin- no rash or lesion Psych- euthymic mood, full affect Neuro- strength and sensation are intact  LABS: Basic Metabolic Panel:  Recent Labs  05/16/15 0335 05/17/15 0211  NA 140 137  K 3.9 4.2  CL 106 107  CO2 26 25  GLUCOSE 97 93  BUN 24* 23*  CREATININE 1.97* 1.81*  CALCIUM 9.0 9.0  MG 1.8 1.8   CBC:  Recent Labs  05/14/15 1439  WBC 8.7  HGB 15.3  HCT 45.4  MCV 77.1*  PLT 263   Cardiac Enzymes:  Recent Labs  05/14/15 2320 05/15/15 0406 05/15/15 0953  TROPONINI 0.04* 0.04* 0.03   Hemoglobin A1C:  Recent Labs  05/15/15 1339  HGBA1C 6.5*   Thyroid Function Tests:  Recent Labs  05/14/15 2320  TSH 2.233   RADIOLOGY: Dg Chest 2 View 05/14/2015   CLINICAL DATA:  Lightheadedness and shortness of breath for 1 hour, chest pain, type II diabetes mellitus, morbid obesity, stage 2 chronic kidney disease, smoking, CHF  EXAM: CHEST  2 VIEW  COMPARISON:  12/28/2014  FINDINGS: Borderline enlargement of cardiac silhouette.  Slight pulmonary vascular congestion.  Mediastinal contours normal.  Lungs clear.  No pleural effusion or pneumothorax.  Bones unremarkable.  IMPRESSION: Borderline enlargement of cardiac silhouette with pulmonary vascular congestion.  No acute infiltrate.  Mild pulmonary edema seen on the previous exam resolved.   Electronically Signed   By: Lavonia Dana M.D.   On: 05/14/2015 15:04    ASSESSMENT AND PLAN:  Principal Problem:   Chest pain Active Problems:   HYPERTENSION, BENIGN ESSENTIAL   Diabetes mellitus type 2, uncontrolled   Morbid obesity   OSA (obstructive sleep apnea)   Acute-on-chronic kidney injury   CHF (congestive heart failure), NYHA class I   1. 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 Pending catheterization this morning to further define anatomy. If no need for revascularization, will plan primary prevention S-ICD later today   2. Chest pain/unstable angina For cath this morning Chest pain has not recurred since admission  3. HTN Stable No change required today  4.  NSVT Asymptomatic Increase BB as blood pressure allows  Chanetta Marshall, NP 05/17/2015 8:21 AM  I have seen and examined this patient with Chanetta Marshall.  Agree with above, note added to reflect my findings.  On exam, regular rate and rhythm, lungs clear.  Cath today showed no CAD but high LVEDP.  Given 80 lasix and urinating well.  Will plan for SICD today with anesthesia for primary prevention.    Will M. Camnitz MD 05/17/2015 10:26 AM

## 2015-05-17 NOTE — Discharge Instructions (Signed)
° ° °  Supplemental Discharge Instructions for  Pacemaker/Defibrillator Patients   NO DRIVING for   1 week  ; you may begin driving on  1/44/81   .  WOUND CARE - Keep the wound area clean and dry.  Do not get this area wet for one week. No showers for one week; you may shower on   05/24/15  . - The tape/steri-strips on your wound will fall off; do not pull them off.  No bandage is needed on the site.  DO  NOT apply any creams, oils, or ointments to the wound area. - If you notice any drainage or discharge from the wound, any swelling or bruising at the site, or you develop a fever > 101? F after you are discharged home, call the office at once.  Special Instructions - You are still able to use cellular telephones; use the ear opposite the side where you have your pacemaker/defibrillator.  Avoid carrying your cellular phone near your device. - When traveling through airports, show security personnel your identification card to avoid being screened in the metal detectors.  Ask the security personnel to use the hand wand. - Avoid arc welding equipment, MRI testing (magnetic resonance imaging), TENS units (transcutaneous nerve stimulators).  Call the office for questions about other devices. - Avoid electrical appliances that are in poor condition or are not properly grounded. - Microwave ovens are safe to be near or to operate.  Additional information for defibrillator patients should your device go off: - If your device goes off ONCE and you feel fine afterward, notify the device clinic nurses. - If your device goes off ONCE and you do not feel well afterward, call 911. - If your device goes off TWICE, call 911. - If your device goes off THREE times in one day, call 911.  DO NOT DRIVE YOURSELF OR A FAMILY MEMBER WITH A DEFIBRILLATOR TO THE HOSPITAL--CALL 911.

## 2015-05-17 NOTE — Progress Notes (Signed)
Patient stated that he will put his CPAP on when he is ready to go to bed. RT notified patient to call RT if assistance is needed.

## 2015-05-17 NOTE — Progress Notes (Signed)
Called back by Cards Fellow. Ordered Stat labs, requested a minimum of 4.0 on Potassium or 2.0 on Magnesium. If less, then replacement would ordered

## 2015-05-17 NOTE — Progress Notes (Signed)
Pt is awake and oriented.Nausea continues. Awaiting improvement before transport to 3W32.

## 2015-05-17 NOTE — Anesthesia Preprocedure Evaluation (Addendum)
Anesthesia Evaluation  Patient identified by MRN, date of birth, ID band Patient awake    Reviewed: Allergy & Precautions, NPO status , Patient's Chart, lab work & pertinent test results  Airway Mallampati: II  TM Distance: >3 FB Neck ROM: Full    Dental  (+) Dental Advisory Given   Pulmonary asthma , sleep apnea , former smoker,  breath sounds clear to auscultation        Cardiovascular hypertension, +CHF Rhythm:Regular Rate:Normal  EF 20-25%. NICM. Normal coronaries on [facility] 8/12.   Neuro/Psych Anxiety negative neurological ROS     GI/Hepatic negative GI ROS, Neg liver ROS,   Endo/Other  diabetes, Type 2, Insulin DependentMorbid obesity  Renal/GU CRFRenal disease     Musculoskeletal   Abdominal   Peds  Hematology negative hematology ROS (+)   Anesthesia Other Findings   Reproductive/Obstetrics                            Anesthesia Physical Anesthesia Plan  ASA: III  Anesthesia Plan: General   Post-op Pain Management:    Induction: Intravenous  Airway Management Planned: LMA and Oral ETT  Additional Equipment: Arterial line  Intra-op Plan:   Post-operative Plan: Extubation in OR  Informed Consent: I have reviewed the patients History and Physical, chart, labs and discussed the procedure including the risks, benefits and alternatives for the proposed anesthesia with the patient or authorized representative who has indicated his/her understanding and acceptance.   Dental advisory given  Plan Discussed with: CRNA  Anesthesia Plan Comments:         Anesthesia Quick Evaluation

## 2015-05-17 NOTE — Interval H&P Note (Signed)
History and Physical Interval Note:  05/17/2015 7:53 AM  Jeffrey [name]  has presented today for surgery, with the diagnosis of cp, pulmonary hypertension  The various methods of treatment have been discussed with the patient and family. After consideration of risks, benefits and other options for treatment, the patient has consented to  Procedure(s): Right/Left Heart Cath and Coronary Angiography (N/A) and possible angioplasty as a surgical intervention .  The patient's history has been reviewed, patient examined, no change in status, stable for surgery.  I have reviewed the patient's chart and labs.  Questions were answered to the patient's satisfaction.     [name], Quillian Quince

## 2015-05-17 NOTE — Anesthesia Procedure Notes (Signed)
Procedure Name: Intubation Performed by: Mariea Clonts Pre-anesthesia Checklist: Patient identified, Timeout performed, Emergency Drugs available and Suction available Patient Re-evaluated:Patient Re-evaluated prior to inductionOxygen Delivery Method: Circle system utilized Preoxygenation: Pre-oxygenation with 100% oxygen Intubation Type: IV induction and Cricoid Pressure applied Ventilation: Two handed mask ventilation required, Oral airway inserted - appropriate to patient size and Mask ventilation with difficulty Laryngoscope Size: Miller and 3 Grade View: Grade II Tube type: Oral Tube size: 7.5 mm Number of attempts: 1 Placement Confirmation: ETT inserted through vocal cords under direct vision,  breath sounds checked- equal and bilateral and positive ETCO2 Tube secured with: Tape Dental Injury: Teeth and Oropharynx as per pre-operative assessment

## 2015-05-17 NOTE — Progress Notes (Addendum)
Rt radial band removed. Tegaderm applied with a 4x4. Rt radial site is unremarkable. Entry by Coralee Rud.

## 2015-05-17 NOTE — Anesthesia Postprocedure Evaluation (Signed)
  Anesthesia Post-op Note  Patient: Jeffrey [name]  Procedure(s) Performed: Procedure(s): SubQ ICD Implant (N/A)  Patient Location: PACU  Anesthesia Type:General  Level of Consciousness: awake, alert  and oriented  Airway and Oxygen Therapy: Patient Spontanous Breathing  Post-op Pain: minimal  Post-op Assessment: Post-op Vital signs reviewed and Patient's Cardiovascular Status Stable              Post-op Vital Signs: Reviewed and stable  Last Vitals:  Filed Vitals:   05/17/15 1605  BP: 114/87  Pulse: 80  Temp:   Resp: 22    Complications: No apparent anesthesia complications

## 2015-05-17 NOTE — H&P (View-Only) (Signed)
Patient Name: Jeffrey [name] Date of Encounter: 05/16/2015   SUBJECTIVE  Feels better. Denies chest pain, sob or palpitation.   CURRENT MEDS . allopurinol  100 mg Oral BID  . atorvastatin  80 mg Oral q1800  . carvedilol  18.75 mg Oral BID  . colchicine  0.6 mg Oral Daily  . heparin  5,000 Units Subcutaneous 3 times per day  . hydrALAZINE  25 mg Oral TID  . insulin aspart  0-5 Units Subcutaneous QHS  . insulin aspart  0-9 Units Subcutaneous TID WC  . insulin aspart protamine- aspart  34 Units Subcutaneous BID WC  . isosorbide mononitrate  30 mg Oral Daily  . sodium chloride  3 mL Intravenous Q12H  . sodium chloride  3 mL Intravenous Q12H  . spironolactone  12.5 mg Oral Daily  . torsemide  20 mg Oral BID    OBJECTIVE  Filed Vitals:   05/15/15 1646 05/15/15 1851 05/15/15 2008 05/16/15 0500  BP: 101/63 112/66 114/73 113/78  Pulse: 73  80 63  Temp:   98.1 F (36.7 C) 98.4 F (36.9 C)  TempSrc:   Oral Oral  Resp:      Height:      Weight:    287 lb 9.6 oz (130.455 kg)  SpO2: 92%  94% 95%    Intake/Output Summary (Last 24 hours) at 05/16/15 0857 Last data filed at 05/16/15 0500  Gross per 24 hour  Intake    840 ml  Output    925 ml  Net    -85 ml   Filed Weights   05/14/15 2303 05/15/15 0500 05/16/15 0500  Weight: 289 lb 3.2 oz (131.18 kg) 284 lb 12.8 oz (129.184 kg) 287 lb 9.6 oz (130.455 kg)    PHYSICAL EXAM  General: Pleasant, NAD. Neuro: Alert and oriented X 3. Moves all extremities spontaneously. Psych: Normal affect. HEENT:  Normal  Neck: Supple without bruits or  JVD. Lungs:  Resp regular and unlabored, CTA. Heart: RRR no s3, s4, or murmurs. Abdomen: Soft, non-tender, non-distended, BS + x 4.  Extremities: No clubbing, cyanosis or edema. DP/PT/Radials 2+ and equal bilaterally.  Accessory Clinical Findings  CBC  Recent Labs  05/14/15 1439  WBC 8.7  HGB 15.3  HCT 45.4  MCV 77.1*  PLT 932   Basic Metabolic Panel  Recent Labs   05/15/15 0406 05/16/15 0335  NA 140 140  K 3.7 3.9  CL 105 106  CO2 27 26  GLUCOSE 153* 97  BUN 22* 24*  CREATININE 1.81* 1.97*  CALCIUM 9.2 9.0    Recent Labs  05/14/15 2320 05/15/15 0406 05/15/15 0953  TROPONINI 0.04* 0.04* 0.03   BNP Invalid input(s): POCBNP D-Dimer No results for input(s): DDIMER in the last 72 hours. Hemoglobin A1C  Recent Labs  05/15/15 1339  HGBA1C 6.5*   Fasting Lipid Panel No results for input(s): CHOL, HDL, LDLCALC, TRIG, CHOLHDL, LDLDIRECT in the last 72 hours. Thyroid Function Tests  Recent Labs  05/14/15 2320  TSH 2.233    TELE  NSR  Radiology/Studies  Dg Chest 2 View  05/14/2015   CLINICAL DATA:  Lightheadedness and shortness of breath for 1 hour, chest pain, type II diabetes mellitus, morbid obesity, stage 2 chronic kidney disease, smoking, CHF  EXAM: CHEST  2 VIEW  COMPARISON:  12/28/2014  FINDINGS: Borderline enlargement of cardiac silhouette.  Slight pulmonary vascular congestion.  Mediastinal contours normal.  Lungs clear.  No pleural effusion or pneumothorax.  Bones unremarkable.  IMPRESSION: Borderline enlargement of cardiac silhouette with pulmonary vascular congestion.  No acute infiltrate.  Mild pulmonary edema seen on the previous exam resolved.   Electronically Signed   By: Lavonia Dana M.D.   On: 05/14/2015 15:04   Echo 8/10 LV EF: 20% -  25%  ------------------------------------------------------------------- Indications:   Chest pain 786.51.  ------------------------------------------------------------------- History:  PMH: Hypertension, benign, essential. Type 2 diabetes, uncontrolled. Morbid obesity. Congestive heart failure. Risk factors: Dyslipidemia.  ------------------------------------------------------------------- Study Conclusions  - Left ventricle: The cavity size was moderately dilated. Wall thickness was normal. Systolic function was severely reduced. The estimated ejection fraction  was in the range of 20% to 25%. Severe diffuse hypokinesis with no identifiable regional variations. Doppler parameters are consistent with abnormal left ventricular relaxation (grade 1 diastolic dysfunction). Indeterminate mean left atrial pressure.  ASSESSMENT AND PLAN  1. Chest pain: Concerning for unstable angina pectoris. Trop 0.04->0.04->0.03. Plan was for cath today, however got cancelled due to increasing creatinine. Gentle hydration. NPO after midnight, now plan for cath tomorrow.  - Continue ASA, BB, statin, heparin, imdur 2. Acute-on-chronic kidney injury: s/p right nephrectomy. Creatinine further increase to 1.91. Discontinued ACE. Can discontinue spironolactone.  3. Chronic CHF (congestive heart failure), NYHA class I: BNP of 559.1, EF 20-25%. Seen by EP - will make decision on ICD after cath today.  4.  HYPERTENSION, BENIGN ESSENTIAL - stable  5. Diabetes mellitus type 2, uncontrolled - on SSI, HgbA1c 6.5 6. Morbid obesity 7. OSA (obstructive sleep apnea) - on CPAP 8. 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 NSVT- had a 6 beats of VT at 0302. Asymptomatic. K normal. No further episode. Will add magnesium.    Jarrett Soho PA-C Pager [phone]  Agree with above assessment.  The patient is not experiencing any significant dyspnea at this time.  His creatinine is too high for cardiac catheterization.  Creatinine 1.91.  We will hold his ace inhibitors and his spironolactone and reduce his torsemide today.  Anticipate cardiac catheterization tomorrow.  Lungs are clear on auscultation and chest x-ray shows resolution of prior pulmonary edema.

## 2015-05-17 NOTE — Progress Notes (Signed)
Pt had a 17 beat run of VTach ay 0109 while asleep. Cards fellow texted

## 2015-05-17 NOTE — Transfer of Care (Signed)
Immediate Anesthesia Transfer of Care Note  Patient: Jeffrey [name]  Procedure(s) Performed: Procedure(s): SubQ ICD Implant (N/A)  Patient Location: PACU  Anesthesia Type:General  Level of Consciousness: awake, alert  and oriented  Airway & Oxygen Therapy: Patient Spontanous Breathing and Patient connected to nasal cannula oxygen  Post-op Assessment: Report given to RN and Post -op Vital signs reviewed and stable  Post vital signs: Reviewed and stable  Last Vitals:  Filed Vitals:   05/17/15 1140  BP:   Pulse: 76  Temp:   Resp: 18    Complications: No apparent anesthesia complications

## 2015-05-18 ENCOUNTER — Inpatient Hospital Stay (HOSPITAL_COMMUNITY): Payer: Self-pay

## 2015-05-18 DIAGNOSIS — Z959 Presence of cardiac and vascular implant and graft, unspecified: Secondary | ICD-10-CM

## 2015-05-18 DIAGNOSIS — Z95810 Presence of automatic (implantable) cardiac defibrillator: Secondary | ICD-10-CM | POA: Insufficient documentation

## 2015-05-18 DIAGNOSIS — Z95818 Presence of other cardiac implants and grafts: Secondary | ICD-10-CM | POA: Insufficient documentation

## 2015-05-18 LAB — GLUCOSE, CAPILLARY
GLUCOSE-CAPILLARY: 92 mg/dL (ref 65–99)
GLUCOSE-CAPILLARY: 102 mg/dL — AB (ref 65–99)
GLUCOSE-CAPILLARY: 92 mg/dL (ref 65–99)
GLUCOSE-CAPILLARY: 105 mg/dL — AB (ref 65–99)
Glucose-Capillary: 144 mg/dL — ABNORMAL HIGH (ref 65–99)
Glucose-Capillary: 103 mg/dL — ABNORMAL HIGH (ref 65–99)

## 2015-05-18 LAB — CBC
HCT: 40.7 % (ref 39.0–52.0)
HEMOGLOBIN: 13.1 g/dL (ref 13.0–17.0)
MCH: 25.1 pg — AB (ref 26.0–34.0)
MCHC: 32.2 g/dL (ref 30.0–36.0)
MCV: 78.1 fL (ref 78.0–100.0)
Platelets: 280 10*3/uL (ref 150–400)
RBC: 5.21 MIL/uL (ref 4.22–5.81)
RDW: 16.3 % — AB (ref 11.5–15.5)
WBC: 8.7 10*3/uL (ref 4.0–10.5)

## 2015-05-18 LAB — BASIC METABOLIC PANEL
Anion gap: 6 (ref 5–15)
BUN: 13 mg/dL (ref 6–20)
CALCIUM: 8.9 mg/dL (ref 8.9–10.3)
CO2: 25 mmol/L (ref 22–32)
CREATININE: 1.65 mg/dL — AB (ref 0.61–1.24)
Chloride: 103 mmol/L (ref 101–111)
GFR calc Af Amer: 59 mL/min — ABNORMAL LOW (ref >60)
GFR calc non Af Amer: 50 mL/min — ABNORMAL LOW (ref >60)
Glucose, Bld: 106 mg/dL — ABNORMAL HIGH (ref 65–99)
POTASSIUM: 4 mmol/L (ref 3.5–5.1)
Sodium: 134 mmol/L — ABNORMAL LOW (ref 135–145)

## 2015-05-18 MED ORDER — POLYETHYLENE GLYCOL 3350 17 G PO PACK
17.0000 g | PACK | Freq: Every day | ORAL | Status: DC
  Administered 2015-05-19: 17 g via ORAL
  Filled 2015-05-18 (×2): qty 1

## 2015-05-18 MED ORDER — TORSEMIDE 20 MG PO TABS: 10.0000 mg | ORAL_TABLET | Freq: Every day | ORAL | Status: DC

## 2015-05-18 MED ORDER — FUROSEMIDE 10 MG/ML IJ SOLN
40.0000 mg | Freq: Once | INTRAMUSCULAR | Status: AC
  Administered 2015-05-18: 40 mg via INTRAVENOUS
  Filled 2015-05-18: qty 4

## 2015-05-18 MED ORDER — ALLOPURINOL 100 MG PO TABS: 100.0000 mg | ORAL_TABLET | Freq: Two times a day (BID) | ORAL | Status: DC

## 2015-05-18 NOTE — Progress Notes (Signed)
Subjective:  Minor postoperative discomfort Minimal shortness of breath  Objective:  Vital Signs in the last 24 hours: Temp:  [98.7 F (37.1 C)] 98.7 F (37.1 C) (08/13 0415) Pulse Rate:  [67-84] 81 (08/13 0415) Resp:  [11-38] 18 (08/13 0415) BP: (85-124)/(57-87) 106/76 mmHg (08/13 0415) SpO2:  [80 %-100 %] 97 % (08/13 0415)  Intake/Output from previous day: 08/12 0701 - 08/13 0700 In: 1750 [I.V.:1750] Out: 5525 [Urine:5525]   Physical Exam: General: Well developed, well nourished, in no acute distress. Head:  Normocephalic and atraumatic. Lungs: Clear to auscultation and percussion. Heart: Normal S1 and S2.  No murmur, rubs or gallops.  Abdomen: soft, non-tender, positive bowel sounds. Overweight Extremities: No clubbing or cyanosis. No edema. Neurologic: Alert and oriented x 3.    Lab Results:  Recent Labs  05/17/15 1850  WBC 13.9*  HGB 14.5  PLT 261    Recent Labs  05/16/15 0335 05/17/15 0211 05/17/15 1850  NA 140 137  --   K 3.9 4.2  --   CL 106 107  --   CO2 26 25  --   GLUCOSE 97 93  --   BUN 24* 23*  --   CREATININE 1.97* 1.81* 1.74*   No results for input(s): TROPONINI in the last 72 hours.  Invalid input(s): CK, MB Hepatic Function Panel No results for input(s): PROT, ALBUMIN, AST, ALT, ALKPHOS, BILITOT, BILIDIR, IBILI in the last 72 hours. No results for input(s): CHOL in the last 72 hours. No results for input(s): PROTIME in the last 72 hours.  Imaging: Dg Chest 2 View  05/18/2015   CLINICAL DATA:  Status post subcutaneous ICD placement.  EXAM: CHEST - 2 VIEW  COMPARISON:  05/14/2015  FINDINGS: Subcutaneous ICD lead in the anterior chest wall is seen attaching to a left lateral chest wall generator. No evidence of complication after the procedure with minimal right basilar atelectasis present. There is no evidence of pulmonary edema, consolidation, pneumothorax, nodule or pleural fluid. The heart size is stable.  IMPRESSION: No acute  findings status post subcutaneous ICD placement.   Electronically Signed   By: Aletta Edouard M.D.   On: 05/18/2015 09:54   Personally viewed.   Telemetry: Nonsustained ventricular tachycardia approximately 15 beats Personally viewed.   EKG:  Sinus rhythm, prolonged QT  Cardiac Studies:  As above, catheterization, echocardiogram reviewed  Scheduled Meds: . atorvastatin  80 mg Oral q1800  . carvedilol  18.75 mg Oral BID  .  ceFAZolin (ANCEF) IV  1 g Intravenous Q6H  . colchicine  0.6 mg Oral Daily  . furosemide  40 mg Intravenous Once  . heparin  5,000 Units Subcutaneous 3 times per day  . hydrALAZINE  25 mg Oral TID  . insulin aspart  0-5 Units Subcutaneous QHS  . insulin aspart  0-9 Units Subcutaneous TID WC  . insulin aspart protamine- aspart  34 Units Subcutaneous BID WC  . isosorbide mononitrate  30 mg Oral Daily  . sodium chloride  3 mL Intravenous Q12H  . sodium chloride  3 mL Intravenous Q12H  . torsemide  10 mg Oral Daily   Continuous Infusions: . sodium chloride 50 mL/hr at 05/16/15 1536   PRN Meds:.sodium chloride, sodium chloride, acetaminophen, acetaminophen, allopurinol, fentaNYL (SUBLIMAZE) injection, fentaNYL (SUBLIMAZE) injection, HYDROmorphone (DILAUDID) injection, ondansetron (ZOFRAN) IV, oxyCODONE-acetaminophen, phenol, promethazine, sodium chloride, sodium chloride, zolpidem   Assessment/Plan:  Principal Problem:   Chest pain Active Problems:   HYPERTENSION, BENIGN ESSENTIAL   Diabetes mellitus  type 2, uncontrolled   Morbid obesity   OSA (obstructive sleep apnea)   Acute-on-chronic kidney injury   CHF (congestive heart failure), NYHA class I   Pain in the chest   Acute on chronic systolic HF (heart failure)  39 year old status post subcutaneous ICD placement on 05/17/15 with nonischemic cardiomyopathy, normal coronary arteries on catheterization 05/17/15 with acute on chronic systolic heart failure, nonsustained ventricular tachycardia.  1. Acute on  chronic systolic heart failure, EF 20%  - I will give him 40 mg IV Lasix today  - Appears to be very close to euvolemic  - Continue torsemide  - Should be ready to go home tomorrow  2. Nonsustained ventricular tachycardia  - Subcutaneous ICD in place  - Standard postoperative discomfort  - Chest x-ray unremarkable  3. Morbid obesity  - Continue to encourage weight loss   Jeffrey [name], Jeffrey [name] 05/18/2015, 10:51 AM

## 2015-05-18 NOTE — Progress Notes (Signed)
[facility]                                                                              Patient Demographics  Jeffrey [name], is a 39 y.o. male, DOB - 05/27/1976, PYK:[id number]  Admit date - 05/14/2015   Admitting Physician Allyne Gee, MD  Outpatient Primary MD for the patient is Angelica Chessman, MD  LOS -    Chief Complaint  Patient presents with  . Chest Pain   Late entry note, patient was seen post cath around 5 PM.     Brief HPI   Jeffrey [name] is a 39 y.o. male with history of CHF HTN Chest pain NSTEMI DM Type 2 Uncontrolled presents to the ED with chest pain. Patient states that he started having central chest pain around 2PM on the day of admission, while he was just resting/sitting in the house. He had shortness of breath associated. He stated that there was no radiation of the pain. He denied having headache dizziness and did not pass out. He states that he has had an MI in the past and also has a history of CHF. By his chart he had an echo done which shows an EF of 20-25%. He is on maximal therapy with his CHF medications. He has no worsening of ankle edema noted. He has no abdominal pain. His CXR actually looks improved from prior films.   Assessment & Plan    Principal Problem:   Chest pain: Patient with multiple risk factors including prior CAD, CHF, EF of 20-25%, NSTEMI, concerning for unstable angina - Chest pain resolved at the time of my encounter. Troponins elevated at 0.04 with BNP of 559.1 - Cardiology consulted, patient underwent cardiac cath with subcutaneous ICD placement on 8/12  Active Problems: Nonischemic cardiomyopathy, chronic systolic CHF, EF 53-97% - Cardiology following, status post ICD placement    HYPERTENSION, BENIGN ESSENTIAL - Currently stable, continue antihypertensives    Diabetes mellitus type 2, uncontrolled - Continue sliding scale insulin, hemoglobin A1c 6.5     OSA (obstructive sleep apnea) -CPAP at night      Acute-on-chronic kidney injury - Repeat BMET today    CHF (congestive heart failure), NYHA class I - Currently stable, continue spironolactone, torsemide, cardiology following  Code Status: Full code   Family Communication: Discussed in detail with the patient, all imaging results, lab results explained to the patient   Disposition Plan: DC in a.m. per cardiology  Time Spent in minutes  25 minutes  Procedures    Consults   Cardiology  DVT Prophylaxis   heparin   Medications  Scheduled Meds: . atorvastatin  80 mg Oral q1800  . carvedilol  18.75 mg Oral BID  .  ceFAZolin (ANCEF) IV  1 g Intravenous Q6H  . colchicine  0.6 mg Oral Daily  . furosemide  40 mg Intravenous Once  . heparin  5,000 Units Subcutaneous 3 times per day  . hydrALAZINE  25 mg Oral TID  . insulin aspart  0-5 Units Subcutaneous QHS  . insulin aspart  0-9 Units Subcutaneous TID WC  . insulin aspart protamine- aspart  34 Units  Subcutaneous BID WC  . isosorbide mononitrate  30 mg Oral Daily  . sodium chloride  3 mL Intravenous Q12H  . sodium chloride  3 mL Intravenous Q12H  . torsemide  10 mg Oral Daily   Continuous Infusions: . sodium chloride 50 mL/hr at 05/16/15 1536   PRN Meds:.sodium chloride, sodium chloride, acetaminophen, acetaminophen, allopurinol, fentaNYL (SUBLIMAZE) injection, fentaNYL (SUBLIMAZE) injection, HYDROmorphone (DILAUDID) injection, ondansetron (ZOFRAN) IV, oxyCODONE-acetaminophen, phenol, promethazine, sodium chloride, sodium chloride, zolpidem   Antibiotics   Anti-infectives    Start     Dose/Rate Route Frequency Ordered Stop   05/17/15 1745  ceFAZolin (ANCEF) IVPB 1 g/50 mL premix     1 g 100 mL/hr over 30 Minutes Intravenous Every 6 hours 05/17/15 1503 05/18/15 1144   05/17/15 0800  gentamicin (GARAMYCIN) 80 mg in sodium chloride irrigation 0.9 % 500 mL irrigation     80 mg Irrigation To Cath Lab 05/16/15 2017 05/17/15 1358   05/17/15 0800  ceFAZolin (ANCEF) 3 g in dextrose  5 % 50 mL IVPB  Status:  Discontinued     3 g 160 mL/hr over 30 Minutes Intravenous To Cath Lab 05/16/15 2017 05/17/15 1639        Subjective:   Jeffrey [name] was seen and examined today. No acute issues, no chest pain, shortness of breath. Patient denies dizziness, abdominal pain, N/V/D/C, new weakness, numbess, tingling. Doing well no acute issues overnight.   Objective:   Blood pressure 106/76, pulse 81, temperature 98.7 F (37.1 C), temperature source Oral, resp. rate 18, height 5\' 7"  (1.702 m), weight 132.632 kg (292 lb 6.4 oz), SpO2 97 %.  Wt Readings from Last 3 Encounters:  05/17/15 132.632 kg (292 lb 6.4 oz)  05/07/15 131.09 kg (289 lb)  03/11/15 135.172 kg (298 lb)     Intake/Output Summary (Last 24 hours) at 05/18/15 1118 Last data filed at 05/18/15 0923  Gross per 24 hour  Intake   1700 ml  Output   3175 ml  Net  -1475 ml    Exam  General: Alert and oriented x 3, NAD  HEENT:  PERRLA, EOMI, Anicteric Sclera, mucous membranes moist.   Neck: Supple, no JVD, no masses  CVS: S1 S2 auscultated, no rubs, murmurs or gallops. Regular rate and rhythm. ICD in the left chest wall  Respiratory: Clear to auscultation bilaterally, no wheezing, rales or rhonchi  Abdomen: obese, Soft, nontender, nondistended, + bowel sounds  Ext: no cyanosis clubbing or edema  Neuro: AAOx3, Cr N's II- XII. Strength 5/5 upper and lower extremities bilaterally  Skin: No rashes  Psych: Normal affect and demeanor, alert and oriented x3    Data Review   Micro Results No results found for this or any previous visit (from the past 240 hour(s)).  Radiology Reports Dg Chest 2 View  05/18/2015   CLINICAL DATA:  Status post subcutaneous ICD placement.  EXAM: CHEST - 2 VIEW  COMPARISON:  05/14/2015  FINDINGS: Subcutaneous ICD lead in the anterior chest wall is seen attaching to a left lateral chest wall generator. No evidence of complication after the procedure with minimal right basilar  atelectasis present. There is no evidence of pulmonary edema, consolidation, pneumothorax, nodule or pleural fluid. The heart size is stable.  IMPRESSION: No acute findings status post subcutaneous ICD placement.   Electronically Signed   By: Aletta Edouard M.D.   On: 05/18/2015 09:54   Dg Chest 2 View  05/14/2015   CLINICAL DATA:  Lightheadedness and shortness of breath  for 1 hour, chest pain, type II diabetes mellitus, morbid obesity, stage 2 chronic kidney disease, smoking, CHF  EXAM: CHEST  2 VIEW  COMPARISON:  12/28/2014  FINDINGS: Borderline enlargement of cardiac silhouette.  Slight pulmonary vascular congestion.  Mediastinal contours normal.  Lungs clear.  No pleural effusion or pneumothorax.  Bones unremarkable.  IMPRESSION: Borderline enlargement of cardiac silhouette with pulmonary vascular congestion.  No acute infiltrate.  Mild pulmonary edema seen on the previous exam resolved.   Electronically Signed   By: Lavonia Dana M.D.   On: 05/14/2015 15:04    CBC  Recent Labs Lab 05/14/15 1439 05/17/15 1850  WBC 8.7 13.9*  HGB 15.3 14.5  HCT 45.4 44.4  PLT 263 261  MCV 77.1* 77.2*  MCH 26.0 25.2*  MCHC 33.7 32.7  RDW 16.3* 16.2*    Chemistries   Recent Labs Lab 05/14/15 1439 05/15/15 0406 05/16/15 0335 05/17/15 0211 05/17/15 1850  NA 138 140 140 137  --   K 4.1 3.7 3.9 4.2  --   CL 107 105 106 107  --   CO2 22 27 26 25   --   GLUCOSE 161* 153* 97 93  --   BUN 19 22* 24* 23*  --   CREATININE 1.76* 1.81* 1.97* 1.81* 1.74*  CALCIUM 9.4 9.2 9.0 9.0  --   MG  --   --  1.8 1.8  --    ------------------------------------------------------------------------------------------------------------------ estimated creatinine clearance is 74 mL/min (by C-G formula based on Cr of 1.74). ------------------------------------------------------------------------------------------------------------------  Recent Labs  05/15/15 1339  HGBA1C 6.5*    ------------------------------------------------------------------------------------------------------------------ No results for input(s): CHOL, HDL, LDLCALC, TRIG, CHOLHDL, LDLDIRECT in the last 72 hours. ------------------------------------------------------------------------------------------------------------------ No results for input(s): TSH, T4TOTAL, T3FREE, THYROIDAB in the last 72 hours.  Invalid input(s): FREET3 ------------------------------------------------------------------------------------------------------------------ No results for input(s): VITAMINB12, FOLATE, FERRITIN, TIBC, IRON, RETICCTPCT in the last 72 hours.  Coagulation profile  Recent Labs Lab 05/16/15 0335  INR 1.04    No results for input(s): DDIMER in the last 72 hours.  Cardiac Enzymes  Recent Labs Lab 05/14/15 2320 05/15/15 0406 05/15/15 0953  TROPONINI 0.04* 0.04* 0.03   ------------------------------------------------------------------------------------------------------------------ Invalid input(s): POCBNP   Recent Labs  05/17/15 0918 05/17/15 1511 05/17/15 1648 05/17/15 2047 05/18/15 0418 05/18/15 0742  GLUCAP 94 101* 101* 92 144* 103*     RAI,RIPUDEEP M.D. [facility] 05/18/2015, 11:18 AM  Pager: [phone] Between 7am to 7pm - call Pager - [phone]  After 7pm go to [email or web address] - password [email or web address]  Call night coverage person covering after 7pm

## 2015-05-18 NOTE — Progress Notes (Signed)
[facility]                                                                              Patient Demographics  Jeffrey [name], is a 39 y.o. male, DOB - 19-Jun-1976, [id number]  Admit date - 05/14/2015   Admitting Physician Allyne Gee, MD  Outpatient Primary MD for the patient is Angelica Chessman, MD  LOS -    Chief Complaint  Patient presents with  . Chest Pain   Late entry note, patient was seen post cath around 5 PM.     Brief HPI   Jeffrey [name] is a 39 y.o. male with history of CHF HTN Chest pain NSTEMI DM Type 2 Uncontrolled presents to the ED with chest pain. Patient states that he started having central chest pain around 2PM on the day of admission, while he was just resting/sitting in the house. He had shortness of breath associated. He stated that there was no radiation of the pain. He denied having headache dizziness and did not pass out. He states that he has had an MI in the past and also has a history of CHF. By his chart he had an echo done which shows an EF of 20-25%. He is on maximal therapy with his CHF medications. He has no worsening of ankle edema noted. He has no abdominal pain. His CXR actually looks improved from prior films.   Assessment & Plan    Principal Problem:   Chest pain: Patient with multiple risk factors including prior CAD, CHF, EF of 20-25%, NSTEMI, concerning for unstable angina - Chest pain resolved at the time of my encounter. Troponins elevated at 0.04 with BNP of 559.1 - Cardiology consulted, cath with defibrillator placed today  Active Problems: Nonischemic cardiomyopathy, chronic systolic CHF, EF 53-61% - Cardiology following,    HYPERTENSION, BENIGN ESSENTIAL - Currently stable, continue antihypertensives    Diabetes mellitus type 2, uncontrolled - Continue sliding scale insulin, hemoglobin A1c 6.5     OSA (obstructive sleep apnea) -CPAP at night     Acute-on-chronic kidney injury - Cardiac cath canceled due  to elevated creatinine, cardiology holding spironolactone and torsemide today. ACE inhibitor is already on hold.    CHF (congestive heart failure), NYHA class I - Currently stable, continue spironolactone, torsemide, cardiology following  Code Status: Full code   Family Communication: Discussed in detail with the patient, all imaging results, lab results explained to the patient   Disposition Plan: Not medically ready   Time Spent in minutes  25 minutes  Procedures    Consults   Cardiology  DVT Prophylaxis   heparin   Medications  Scheduled Meds: . atorvastatin  80 mg Oral q1800  . carvedilol  18.75 mg Oral BID  .  ceFAZolin (ANCEF) IV  1 g Intravenous Q6H  . colchicine  0.6 mg Oral Daily  . furosemide  40 mg Intravenous Once  . heparin  5,000 Units Subcutaneous 3 times per day  . hydrALAZINE  25 mg Oral TID  . insulin aspart  0-5 Units Subcutaneous QHS  . insulin aspart  0-9 Units Subcutaneous TID WC  . insulin  aspart protamine- aspart  34 Units Subcutaneous BID WC  . isosorbide mononitrate  30 mg Oral Daily  . sodium chloride  3 mL Intravenous Q12H  . sodium chloride  3 mL Intravenous Q12H  . torsemide  10 mg Oral Daily   Continuous Infusions: . sodium chloride 50 mL/hr at 05/16/15 1536   PRN Meds:.sodium chloride, sodium chloride, acetaminophen, acetaminophen, allopurinol, fentaNYL (SUBLIMAZE) injection, fentaNYL (SUBLIMAZE) injection, HYDROmorphone (DILAUDID) injection, ondansetron (ZOFRAN) IV, oxyCODONE-acetaminophen, phenol, promethazine, sodium chloride, sodium chloride, zolpidem   Antibiotics   Anti-infectives    Start     Dose/Rate Route Frequency Ordered Stop   05/17/15 1745  ceFAZolin (ANCEF) IVPB 1 g/50 mL premix     1 g 100 mL/hr over 30 Minutes Intravenous Every 6 hours 05/17/15 1503 05/18/15 1144   05/17/15 0800  gentamicin (GARAMYCIN) 80 mg in sodium chloride irrigation 0.9 % 500 mL irrigation     80 mg Irrigation To Cath Lab 05/16/15 2017 05/17/15  1358   05/17/15 0800  ceFAZolin (ANCEF) 3 g in dextrose 5 % 50 mL IVPB  Status:  Discontinued     3 g 160 mL/hr over 30 Minutes Intravenous To Cath Lab 05/16/15 2017 05/17/15 1639        Subjective:   Kol Consuegra was seen and examined today. Patient was seen post cath. No acute issues, no chest pain, shortness of breath. Patient denies dizziness, abdominal pain, N/V/D/C, new weakness, numbess, tingling.    Objective:   Blood pressure 106/76, pulse 81, temperature 98.7 F (37.1 C), temperature source Oral, resp. rate 18, height 5\' 7"  (1.702 m), weight 132.632 kg (292 lb 6.4 oz), SpO2 97 %.  Wt Readings from Last 3 Encounters:  05/17/15 132.632 kg (292 lb 6.4 oz)  05/07/15 131.09 kg (289 lb)  03/11/15 135.172 kg (298 lb)     Intake/Output Summary (Last 24 hours) at 05/18/15 1115 Last data filed at 05/18/15 0923  Gross per 24 hour  Intake   1700 ml  Output   3175 ml  Net  -1475 ml    Exam  General: Alert and oriented x 3, NAD  HEENT:  PERRLA, EOMI, Anicteric Sclera, mucous membranes moist.   Neck: Supple, no JVD, no masses  CVS: S1 S2 auscultated, no rubs, murmurs or gallops. Regular rate and rhythm.  Respiratory: Clear to auscultation bilaterally, no wheezing, rales or rhonchi  Abdomen: obese, Soft, nontender, nondistended, + bowel sounds  Ext: no cyanosis clubbing or edema  Neuro: AAOx3, Cr N's II- XII. Strength 5/5 upper and lower extremities bilaterally  Skin: No rashes  Psych: Normal affect and demeanor, alert and oriented x3    Data Review   Micro Results No results found for this or any previous visit (from the past 240 hour(s)).  Radiology Reports Dg Chest 2 View  05/18/2015   CLINICAL DATA:  Status post subcutaneous ICD placement.  EXAM: CHEST - 2 VIEW  COMPARISON:  05/14/2015  FINDINGS: Subcutaneous ICD lead in the anterior chest wall is seen attaching to a left lateral chest wall generator. No evidence of complication after the procedure with  minimal right basilar atelectasis present. There is no evidence of pulmonary edema, consolidation, pneumothorax, nodule or pleural fluid. The heart size is stable.  IMPRESSION: No acute findings status post subcutaneous ICD placement.   Electronically Signed   By: Aletta Edouard M.D.   On: 05/18/2015 09:54   Dg Chest 2 View  05/14/2015   CLINICAL DATA:  Lightheadedness and shortness of breath  for 1 hour, chest pain, type II diabetes mellitus, morbid obesity, stage 2 chronic kidney disease, smoking, CHF  EXAM: CHEST  2 VIEW  COMPARISON:  12/28/2014  FINDINGS: Borderline enlargement of cardiac silhouette.  Slight pulmonary vascular congestion.  Mediastinal contours normal.  Lungs clear.  No pleural effusion or pneumothorax.  Bones unremarkable.  IMPRESSION: Borderline enlargement of cardiac silhouette with pulmonary vascular congestion.  No acute infiltrate.  Mild pulmonary edema seen on the previous exam resolved.   Electronically Signed   By: Lavonia Dana M.D.   On: 05/14/2015 15:04    CBC  Recent Labs Lab 05/14/15 1439 05/17/15 1850  WBC 8.7 13.9*  HGB 15.3 14.5  HCT 45.4 44.4  PLT 263 261  MCV 77.1* 77.2*  MCH 26.0 25.2*  MCHC 33.7 32.7  RDW 16.3* 16.2*    Chemistries   Recent Labs Lab 05/14/15 1439 05/15/15 0406 05/16/15 0335 05/17/15 0211 05/17/15 1850  NA 138 140 140 137  --   K 4.1 3.7 3.9 4.2  --   CL 107 105 106 107  --   CO2 22 27 26 25   --   GLUCOSE 161* 153* 97 93  --   BUN 19 22* 24* 23*  --   CREATININE 1.76* 1.81* 1.97* 1.81* 1.74*  CALCIUM 9.4 9.2 9.0 9.0  --   MG  --   --  1.8 1.8  --    ------------------------------------------------------------------------------------------------------------------ estimated creatinine clearance is 74 mL/min (by C-G formula based on Cr of 1.74). ------------------------------------------------------------------------------------------------------------------  Recent Labs  05/15/15 1339  HGBA1C 6.5*    ------------------------------------------------------------------------------------------------------------------ No results for input(s): CHOL, HDL, LDLCALC, TRIG, CHOLHDL, LDLDIRECT in the last 72 hours. ------------------------------------------------------------------------------------------------------------------ No results for input(s): TSH, T4TOTAL, T3FREE, THYROIDAB in the last 72 hours.  Invalid input(s): FREET3 ------------------------------------------------------------------------------------------------------------------ No results for input(s): VITAMINB12, FOLATE, FERRITIN, TIBC, IRON, RETICCTPCT in the last 72 hours.  Coagulation profile  Recent Labs Lab 05/16/15 0335  INR 1.04    No results for input(s): DDIMER in the last 72 hours.  Cardiac Enzymes  Recent Labs Lab 05/14/15 2320 05/15/15 0406 05/15/15 0953  TROPONINI 0.04* 0.04* 0.03   ------------------------------------------------------------------------------------------------------------------ Invalid input(s): POCBNP   Recent Labs  05/17/15 0918 05/17/15 1511 05/17/15 1648 05/17/15 2047 05/18/15 0418 05/18/15 0742  GLUCAP 94 101* 101* 92 144* 103*     Dennie Moltz M.D. [facility] 05/18/2015, 11:15 AM  Pager: [phone] Between 7am to 7pm - call Pager - [phone]  After 7pm go to [email or web address] - password [email or web address]  Call night coverage person covering after 7pm

## 2015-05-18 NOTE — Clinical Documentation Improvement (Signed)
  Based on your clinical judgement and the clinical indicators listed above, could the patient's condition be further specified in your MD progress note and discharge summary? . 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 Hypertensive CKD with CHF . Other Condition  Patient presents with  chronic New York Heart Association class III heart failure EF (-echo done on May 07, 2015 with EF of 20-25%) BNP of 559.1,Nonischemic cardiomyopathy, Acute-on-chronic kidney injury  Cardiac cath indications with note  :Acute on chronic systolic CHF (congestive heart failure) [I50.23 (ICD-10-CM)]  Thank you, Melvia Heaps, RN, BSN, CDI [phone] [email or web address]

## 2015-05-18 NOTE — Progress Notes (Signed)
Pt to x-ray via bed and on telemetry.

## 2015-05-18 NOTE — Procedures (Signed)
Pt does not wish to wear cpap tonight and will notify RT if he changes his mind. RT will continue to monitor.

## 2015-05-19 LAB — BASIC METABOLIC PANEL
ANION GAP: 9 (ref 5–15)
BUN: 18 mg/dL (ref 6–20)
CO2: 24 mmol/L (ref 22–32)
Calcium: 9.1 mg/dL (ref 8.9–10.3)
Chloride: 104 mmol/L (ref 101–111)
Creatinine, Ser: 1.77 mg/dL — ABNORMAL HIGH (ref 0.61–1.24)
GFR, EST AFRICAN AMERICAN: 54 mL/min — AB (ref >60)
GFR, EST NON AFRICAN AMERICAN: 46 mL/min — AB (ref >60)
GLUCOSE: 90 mg/dL (ref 65–99)
Potassium: 4.2 mmol/L (ref 3.5–5.1)
SODIUM: 137 mmol/L (ref 135–145)

## 2015-05-19 LAB — GLUCOSE, CAPILLARY
GLUCOSE-CAPILLARY: 84 mg/dL (ref 65–99)
Glucose-Capillary: 97 mg/dL (ref 65–99)

## 2015-05-19 LAB — MAGNESIUM: MAGNESIUM: 1.8 mg/dL (ref 1.7–2.4)

## 2015-05-19 MED ORDER — TORSEMIDE 20 MG PO TABS: 10.0000 mg | ORAL_TABLET | Freq: Every day | ORAL | Status: DC

## 2015-05-19 MED ORDER — TRAMADOL HCL 50 MG PO TABS: 50.0000 mg | ORAL_TABLET | Freq: Four times a day (QID) | ORAL | Status: DC | PRN

## 2015-05-19 MED ORDER — PROMETHAZINE HCL 25 MG PO TABS
25.0000 mg | ORAL_TABLET | Freq: Four times a day (QID) | ORAL | Status: DC | PRN
Start: 2015-05-19 — End: 2015-08-13

## 2015-05-19 MED ORDER — POLYETHYLENE GLYCOL 3350 17 G PO PACK: 17.0000 g | PACK | Freq: Every day | ORAL | Status: DC | PRN

## 2015-05-19 NOTE — Discharge Summary (Signed)
Physician Discharge Summary   Patient ID: Jeffrey [name] MRN: [id number] DOB/AGE: 1976/01/16 39 y.o.  Admit date: 05/14/2015 Discharge date: 05/19/2015  Primary Care Physician:  Angelica Chessman, MD  Discharge Diagnoses:    . unstable angina/ Chest pain . Acute-on-chronic kidney injury . Diabetes mellitus type 2, uncontrolled . HYPERTENSION, BENIGN ESSENTIAL . Morbid obesity . OSA (obstructive sleep apnea)  Consults: Cardiology, Dr. Marlou Porch  Recommendations for Outpatient Follow-up:   Patient recommended to follow-up with transitional care clinic and cardiology for wound check, appointments arranged  Follow BMET, CBC    DIET: Carb modified heart healthy diet    Allergies:   Allergies  Allergen Reactions  . Aspirin Shortness Of Breath, Itching and Rash     Burning sensation (Patient reports he tolerates other NSAIDS)   . Bee Venom Hives and Swelling  . Tomato Rash     Discharge Medications:   Medication List    STOP taking these medications        spironolactone 25 MG tablet  Commonly known as:  ALDACTONE      TAKE these medications        allopurinol 100 MG tablet  Commonly known as:  ZYLOPRIM  Take 1 tablet (100 mg total) by mouth 2 (two) times daily.     atorvastatin 80 MG tablet  Commonly known as:  LIPITOR  TAKE 1 TABLET (80 MG TOTAL) BY MOUTH EVERY EVENING.     carvedilol 6.25 MG tablet  Commonly known as:  COREG  Take 3 tablets (18.75 mg total) by mouth 2 (two) times daily.     hydrALAZINE 25 MG tablet  Commonly known as:  APRESOLINE  Take 1 tablet (25 mg total) by mouth 3 (three) times daily.     insulin NPH-regular Human (70-30) 100 UNIT/ML injection  Commonly known as:  NOVOLIN 70/30  Inject 34 Units into the skin 2 (two) times daily with a meal.     isosorbide mononitrate 30 MG 24 hr tablet  Commonly known as:  IMDUR  Take 1 tablet (30 mg total) by mouth daily.     lisinopril 10 MG tablet  Commonly known as:  PRINIVIL,ZESTRIL   Take 1 tablet (10 mg total) by mouth daily.     OVER THE COUNTER MEDICATION  Take 1 tablet by mouth 2 (two) times daily as needed (gout attacks). Herbal medication "Go out"     polyethylene glycol packet  Commonly known as:  MIRALAX / GLYCOLAX  Take 17 g by mouth daily as needed.     promethazine 25 MG tablet  Commonly known as:  PHENERGAN  Take 1 tablet (25 mg total) by mouth every 6 (six) hours as needed for nausea or vomiting.     torsemide 20 MG tablet  Commonly known as:  DEMADEX  Take 0.5 tablets (10 mg total) by mouth daily.     traMADol 50 MG tablet  Commonly known as:  ULTRAM  Take 1 tablet (50 mg total) by mouth every 6 (six) hours as needed.         Brief H and P: For complete details please refer to admission H and P, but in brief Jeffrey [name] is a 39 y.o. male with history of CHF HTN Chest pain NSTEMI DM Type 2 Uncontrolled presents to the ED with chest pain. Patient states that he started having central chest pain around 2PM on the day of admission, while he was just resting/sitting in the house. He had shortness of breath associated.  He stated that there was no radiation of the pain. He denied having headache dizziness and did not pass out. He states that he has had an MI in the past and also has a history of CHF. By his chart he had an echo done which shows an EF of 20-25%. He is on maximal therapy with his CHF medications. He has no worsening of ankle edema noted. He has no abdominal pain. His CXR actually looks improved from prior films.  Hospital Course:  Unstable angina/chest pain: Patient with multiple risk factors including prior CAD, CHF, EF of 20-25%, NSTEMI, concerning for unstable angina The patient's chest pain had resolved after hospitalized, troponin was elevated at 0.04 with BNP of 559. Cardiology was consulted. Patient underwent cardiac cath with subcutaneous ICD placement on 8/12. Cardiac cath showed normal coronaries with moderately elevated filling  pressures with moderately depressed cardiac output. Plan is to continue medical therapy with heart failure medications. Patient was cleared by cardiology to be discharged home. He has an appointment arranged for wound check.   Nonischemic cardiomyopathy, chronic systolic CHF, EF 01-02% - status post ICD placement on 8/12   HYPERTENSION, BENIGN ESSENTIAL - Currently stable, continue antihypertensives   Diabetes mellitus type 2, uncontrolled - Continue NPH insulin, hemoglobin A1c 6.5    OSA (obstructive sleep apnea) -CPAP at night   Acute-on-chronic kidney injury - Creatinine function is stable at 1.7.    CHF (congestive heart failure), NYHA class I - Currently stable, continue torsemide, cardiology following  Day of Discharge BP 112/81 mmHg  Pulse 66  Temp(Src) 98.5 F (36.9 C) (Oral)  Resp 18  Ht 5\' 7"  (1.702 m)  Wt 131.9 kg (290 lb 12.6 oz)  BMI 45.53 kg/m2  SpO2 98%  Physical Exam: General: Alert and awake oriented x3 not in any acute distress. HEENT: anicteric sclera, pupils reactive to light and accommodation CVS: S1-S2 clear no murmur rubs or gallops Chest: clear to auscultation bilaterally, no wheezing rales or rhonchi, ICD in the left chest wall Abdomen: soft nontender, nondistended, normal bowel sounds Extremities: no cyanosis, clubbing or edema noted bilaterally Neuro: Cranial nerves II-XII intact, no focal neurological deficits   The results of significant diagnostics from this hospitalization (including imaging, microbiology, ancillary and laboratory) are listed below for reference.    LAB RESULTS: Basic Metabolic Panel:  Recent Labs Lab 05/18/15 1055 05/19/15 0505  NA 134* 137  K 4.0 4.2  CL 103 104  CO2 25 24  GLUCOSE 106* 90  BUN 13 18  CREATININE 1.65* 1.77*  CALCIUM 8.9 9.1  MG  --  1.8   Liver Function Tests: No results for input(s): AST, ALT, ALKPHOS, BILITOT, PROT, ALBUMIN in the last 168 hours. No results for input(s): LIPASE,  AMYLASE in the last 168 hours. No results for input(s): AMMONIA in the last 168 hours. CBC:  Recent Labs Lab 05/17/15 1850 05/18/15 1055  WBC 13.9* 8.7  HGB 14.5 13.1  HCT 44.4 40.7  MCV 77.2* 78.1  PLT 261 280   Cardiac Enzymes:  Recent Labs Lab 05/15/15 0406 05/15/15 0953  TROPONINI 0.04* 0.03   BNP: Invalid input(s): POCBNP CBG:  Recent Labs Lab 05/18/15 2118 05/19/15 0729  GLUCAP 105* 97    Significant Diagnostic Studies:  Dg Chest 2 View  05/14/2015   CLINICAL DATA:  Lightheadedness and shortness of breath for 1 hour, chest pain, type II diabetes mellitus, morbid obesity, stage 2 chronic kidney disease, smoking, CHF  EXAM: CHEST  2 VIEW  COMPARISON:  12/28/2014  FINDINGS: Borderline enlargement of cardiac silhouette.  Slight pulmonary vascular congestion.  Mediastinal contours normal.  Lungs clear.  No pleural effusion or pneumothorax.  Bones unremarkable.  IMPRESSION: Borderline enlargement of cardiac silhouette with pulmonary vascular congestion.  No acute infiltrate.  Mild pulmonary edema seen on the previous exam resolved.   Electronically Signed   By: Lavonia Dana M.D.   On: 05/14/2015 15:04    :   Disposition and Follow-up: Discharge Instructions    (HEART FAILURE PATIENTS) Call MD:  Anytime you have any of the following symptoms: 1) 3 pound weight gain in 24 hours or 5 pounds in 1 week 2) shortness of breath, with or without a dry hacking cough 3) swelling in the hands, feet or stomach 4) if you have to sleep on extra pillows at night in order to breathe.    Complete by:  As directed      Diet Carb Modified    Complete by:  As directed      Increase activity slowly    Complete by:  As directed             DISPOSITION: Home   DISCHARGE FOLLOW-UP Follow-up Information    Follow up with [facility]     On 05/21/2015.   Why:  Transitional Care Clinic appointment on 05/21/15 at 3:30 pm with Dr. Jarold Song.   Contact information:    [address] [place] [address] [phone]      Follow up with [facility] On 05/29/2015.   Why:  at 12noon for wound check   Contact information:   [address] [place] [address]        Time spent on Discharge: 35 minutes  Signed:   Shawntrice Salle M.D. [facility] 05/19/2015, 9:49 AM Pager: [phone]

## 2015-05-19 NOTE — Care Management Note (Signed)
Case Management Note  Patient Details  Name: NILS THOR MRN: [id number] Date of Birth: 06-Apr-1976  Subjective/Objective:                  . unstable angina/ Chest pain and is post cardiac catheterization.  Action/Plan: Cm spoke to patient at the bedside who states that he was set up with the [facility] and will be able to have medication assistance from that practice. Pt states that he will be discharged home with insulin and CM confirmed that with primary RN. Pt and CM discussed the importance of pt speaking to someone about health insurance at the clinic and pt verbalized understanding. Pt states that he gets most of his medications at [facility] on the $4 list. Cm also educated on other pharmacy discount programs. Pt denies further discharge needs and states that his fiance and daughter will be at home after discharge to assist with any needs. No further CM needs communicated and anticipate d/c home today.   Expected Discharge Date:  05/19/15              Expected Discharge Plan:  Home/Self Care  In-House Referral:     Discharge planning Services  CM Consult, Follow-up appt scheduled, [place] Acute Care Choice:  NA Choice offered to:  NA  DME Arranged:  N/A DME Agency:  NA  HH Arranged:  NA HH Agency:  NA  Status of Service:  Completed, signed off  Medicare Important Message Given:    Date Medicare IM Given:    Medicare IM give by:    Date Additional Medicare IM Given:    Additional Medicare Important Message give by:     If discussed at [place] of Stay Meetings, dates discussed:    Additional Comments:  Guido Sander, RN 05/19/2015, 10:08 AM

## 2015-05-19 NOTE — Progress Notes (Signed)
Subjective:  Minor postoperative discomfort Doing OK Asking for something for pain and nausea when he goes home.   Objective:  Vital Signs in the last 24 hours: Temp:  [97.8 F (36.6 C)-98.7 F (37.1 C)] 98.5 F (36.9 C) (08/14 0507) Pulse Rate:  [64-80] 66 (08/14 0507) Resp:  [18-20] 18 (08/14 0507) BP: (94-115)/(66-81) 112/81 mmHg (08/14 0507) SpO2:  [96 %-98 %] 98 % (08/14 0507) Weight:  [288 lb 3.2 oz (130.727 kg)-290 lb 12.6 oz (131.9 kg)] 290 lb 12.6 oz (131.9 kg) (08/14 0507)  Intake/Output from previous day: 08/13 0701 - 08/14 0700 In: 123 [P.O.:120; I.V.:3] Out: 2650 [Urine:2650]   Physical Exam: General: Well developed, well nourished, in no acute distress. Head:  Normocephalic and atraumatic. Lungs: Clear to auscultation and percussion. Heart: Normal S1 and S2.  No murmur, rubs or gallops.  Abdomen: soft, non-tender, positive bowel sounds. Overweight Extremities: No clubbing or cyanosis. No edema. Neurologic: Alert and oriented x 3.    Lab Results:  Recent Labs  05/17/15 1850 05/18/15 1055  WBC 13.9* 8.7  HGB 14.5 13.1  PLT 261 280    Recent Labs  05/18/15 1055 05/19/15 0505  NA 134* 137  K 4.0 4.2  CL 103 104  CO2 25 24  GLUCOSE 106* 90  BUN 13 18  CREATININE 1.65* 1.77*   No results for input(s): TROPONINI in the last 72 hours.  Invalid input(s): CK, MB Hepatic Function Panel No results for input(s): PROT, ALBUMIN, AST, ALT, ALKPHOS, BILITOT, BILIDIR, IBILI in the last 72 hours. No results for input(s): CHOL in the last 72 hours. No results for input(s): PROTIME in the last 72 hours.  Imaging: Dg Chest 2 View  05/18/2015   CLINICAL DATA:  Status post subcutaneous ICD placement.  EXAM: CHEST - 2 VIEW  COMPARISON:  05/14/2015  FINDINGS: Subcutaneous ICD lead in the anterior chest wall is seen attaching to a left lateral chest wall generator. No evidence of complication after the procedure with minimal right basilar atelectasis  present. There is no evidence of pulmonary edema, consolidation, pneumothorax, nodule or pleural fluid. The heart size is stable.  IMPRESSION: No acute findings status post subcutaneous ICD placement.   Electronically Signed   By: Aletta Edouard M.D.   On: 05/18/2015 09:54   Personally viewed.   Telemetry: Nonsustained ventricular tachycardia approximately 8 beats Personally viewed.   EKG:  Sinus rhythm, prolonged QT  Cardiac Studies:  As above, catheterization, echocardiogram reviewed  Scheduled Meds: . atorvastatin  80 mg Oral q1800  . carvedilol  18.75 mg Oral BID  . colchicine  0.6 mg Oral Daily  . heparin  5,000 Units Subcutaneous 3 times per day  . hydrALAZINE  25 mg Oral TID  . insulin aspart  0-5 Units Subcutaneous QHS  . insulin aspart  0-9 Units Subcutaneous TID WC  . insulin aspart protamine- aspart  34 Units Subcutaneous BID WC  . isosorbide mononitrate  30 mg Oral Daily  . polyethylene glycol  17 g Oral Daily  . sodium chloride  3 mL Intravenous Q12H  . sodium chloride  3 mL Intravenous Q12H  . torsemide  10 mg Oral Daily   Continuous Infusions: . sodium chloride 50 mL/hr at 05/16/15 1536   PRN Meds:.sodium chloride, sodium chloride, acetaminophen, acetaminophen, allopurinol, fentaNYL (SUBLIMAZE) injection, fentaNYL (SUBLIMAZE) injection, HYDROmorphone (DILAUDID) injection, ondansetron (ZOFRAN) IV, oxyCODONE-acetaminophen, phenol, promethazine, sodium chloride, sodium chloride, zolpidem   Assessment/Plan:  Principal Problem:   Chest pain Active Problems:  HYPERTENSION, BENIGN ESSENTIAL   Diabetes mellitus type 2, uncontrolled   Morbid obesity   OSA (obstructive sleep apnea)   Acute-on-chronic kidney injury   CHF (congestive heart failure), NYHA class I   Pain in the chest   Acute on chronic systolic HF (heart failure)   Cardiac device in situ, other  Scheduled Meds: . atorvastatin  80 mg Oral q1800  . carvedilol  18.75 mg Oral BID  . colchicine  0.6 mg  Oral Daily  . heparin  5,000 Units Subcutaneous 3 times per day  . hydrALAZINE  25 mg Oral TID  . insulin aspart  0-5 Units Subcutaneous QHS  . insulin aspart  0-9 Units Subcutaneous TID WC  . insulin aspart protamine- aspart  34 Units Subcutaneous BID WC  . isosorbide mononitrate  30 mg Oral Daily  . polyethylene glycol  17 g Oral Daily  . sodium chloride  3 mL Intravenous Q12H  . sodium chloride  3 mL Intravenous Q12H  . torsemide  10 mg Oral Daily   Continuous Infusions: . sodium chloride 50 mL/hr at 05/16/15 1536   PRN Meds:.sodium chloride, sodium chloride, acetaminophen, acetaminophen, allopurinol, fentaNYL (SUBLIMAZE) injection, fentaNYL (SUBLIMAZE) injection, HYDROmorphone (DILAUDID) injection, ondansetron (ZOFRAN) IV, oxyCODONE-acetaminophen, phenol, promethazine, sodium chloride, sodium chloride, zolpidem  39 year old status post subcutaneous ICD placement on 05/17/15 with nonischemic cardiomyopathy, normal coronary arteries on catheterization 05/17/15 with acute on chronic systolic heart failure, nonsustained ventricular tachycardia.  1. Acute on chronic systolic heart failure, EF 20%   - Appears euvolemic  - Continue torsemide  - ready to go home tomorrow  2. Nonsustained ventricular tachycardia  - Subcutaneous ICD in place  - Standard postoperative discomfort  - Chest x-ray unremarkable  3. Morbid obesity  - Continue to encourage weight loss  Follow up per EP   Anihya Tuma, [place] 05/19/2015, 8:10 AM

## 2015-05-20 ENCOUNTER — Institutional Professional Consult (permissible substitution): Payer: Self-pay | Admitting: Internal Medicine

## 2015-05-20 ENCOUNTER — Encounter (HOSPITAL_COMMUNITY): Payer: Self-pay | Admitting: Cardiology

## 2015-05-20 MED FILL — Gentamicin Sulfate Inj 40 MG/ML: INTRAMUSCULAR | Qty: 80 | Status: AC

## 2015-05-20 MED FILL — Furosemide Inj 10 MG/ML: INTRAMUSCULAR | Qty: 4 | Status: AC

## 2015-05-21 ENCOUNTER — Encounter: Payer: Self-pay | Admitting: Family Medicine

## 2015-05-21 ENCOUNTER — Ambulatory Visit: Payer: MEDICAID | Attending: Family Medicine | Admitting: Family Medicine

## 2015-05-21 ENCOUNTER — Encounter (HOSPITAL_BASED_OUTPATIENT_CLINIC_OR_DEPARTMENT_OTHER): Payer: Self-pay | Admitting: Clinical

## 2015-05-21 VITALS — BP 115/75 | HR 86 | Temp 98.8°F | Ht 67.0 in | Wt 289.0 lb

## 2015-05-21 DIAGNOSIS — K5909 Other constipation: Secondary | ICD-10-CM | POA: Insufficient documentation

## 2015-05-21 DIAGNOSIS — Z794 Long term (current) use of insulin: Secondary | ICD-10-CM | POA: Insufficient documentation

## 2015-05-21 DIAGNOSIS — I5023 Acute on chronic systolic (congestive) heart failure: Secondary | ICD-10-CM | POA: Insufficient documentation

## 2015-05-21 DIAGNOSIS — N179 Acute kidney failure, unspecified: Secondary | ICD-10-CM

## 2015-05-21 DIAGNOSIS — M10069 Idiopathic gout, unspecified knee: Secondary | ICD-10-CM | POA: Insufficient documentation

## 2015-05-21 DIAGNOSIS — I5022 Chronic systolic (congestive) heart failure: Secondary | ICD-10-CM

## 2015-05-21 DIAGNOSIS — I129 Hypertensive chronic kidney disease with stage 1 through stage 4 chronic kidney disease, or unspecified chronic kidney disease: Secondary | ICD-10-CM | POA: Insufficient documentation

## 2015-05-21 DIAGNOSIS — E1165 Type 2 diabetes mellitus with hyperglycemia: Secondary | ICD-10-CM

## 2015-05-21 DIAGNOSIS — N189 Chronic kidney disease, unspecified: Secondary | ICD-10-CM | POA: Insufficient documentation

## 2015-05-21 DIAGNOSIS — I10 Essential (primary) hypertension: Secondary | ICD-10-CM

## 2015-05-21 DIAGNOSIS — F411 Generalized anxiety disorder: Secondary | ICD-10-CM

## 2015-05-21 DIAGNOSIS — G4733 Obstructive sleep apnea (adult) (pediatric): Secondary | ICD-10-CM | POA: Insufficient documentation

## 2015-05-21 LAB — GLUCOSE, POCT (MANUAL RESULT ENTRY): POC GLUCOSE: 171 mg/dL — AB (ref 70–99)

## 2015-05-21 MED ORDER — ALLOPURINOL 100 MG PO TABS: 100.0000 mg | ORAL_TABLET | Freq: Every day | ORAL | Status: DC

## 2015-05-21 MED ORDER — INSULIN NPH ISOPHANE & REGULAR (70-30) 100 UNIT/ML ~~LOC~~ SUSP: 34.0000 [IU] | Freq: Two times a day (BID) | SUBCUTANEOUS | Status: DC

## 2015-05-21 NOTE — Patient Instructions (Signed)

## 2015-05-21 NOTE — Progress Notes (Signed)
Patient here for hospital follow up after chest pain No pain today Defibrillator placed on Friday Patient states he is having constipation and his last bowel movement was a week and a half ago He took Miralax last night and states it did not help He reports intermittent marijuana use  He states he has anxiety and the marijuana helps relax him Michaelene Song clinic social worker to see patient to discuss resources

## 2015-05-21 NOTE — Progress Notes (Signed)
Jeffrey [name], is a 39 y.o. male  FWY:[id number]  YDX:[id number]  DOB - 17-Feb-1976  Admit Date: 05/14/15 Discharge Date : 05/19/15  CC:  Chief Complaint  Patient presents with  . Hospitalization Follow-up       HPI: Jeffrey [name] is a 39 y.o. male  With a history of HTN, Type 2 DM, NSTEMI who had presented to [facility] ED with chest pain.  The patient's chest pain had resolved after hospitalized, troponin was elevated at 0.04 with BNP of 559 He had a 2d echo which revealed an EF of 20-25%, severe diffuse hypokinesis . Cardiology was consulted. Patient underwent cardiac cath with subcutaneous ICD placement on 8/12. Cardiac cath showed normal coronaries with moderately elevated filling pressures with moderately depressed cardiac output. Plan is to continue medical therapy with heart failure medications.  His NPH was continued for management of DM; renal function was also noticed to be impaired with creatinine of 1.7.  Patient was cleared by cardiology to be discharged home with a follow up with Cardiology.  Interval History: He has frequent Gout flares (none at this time) and previously used OTC herbal remedies; also complains of constipation. Patient has No headache, No chest pain, No abdominal pain - No Nausea, No new weakness tingling or numbness, No Cough - SOB.  Allergies  Allergen Reactions  . Aspirin Shortness Of Breath, Itching and Rash     Burning sensation (Patient reports he tolerates other NSAIDS)   . Bee Venom Hives and Swelling  . Tomato Rash   Past Medical History  Diagnosis Date  . Hypertension   . Morbid obesity   . Presumed NICM     a. 04/2014 Myoview: EF 26%, glob HK, sev glob HK, ? prior infarct;  b. Never cathed 2/2 CKD.  [name] Chronic systolic CHF (congestive heart failure)     a. EF 20-25% in 2012. b. EF 45-50% in 10/2011 with nonischemic nuc - presumed NICM. c. 12/2014 Echo: Sev depressed LV fxn, sev dil LV, mild LVH, mild MR, sev dil LA, mildly reduced RV fxn.  .  Nephrolithiasis   . OSA on CPAP   . High cholesterol   . Childhood asthma   . Type II diabetes mellitus   . Anxiety   . Renal cell carcinoma     a. s/p Rt robotic assisted partial converted to radical nephrectomy on 01/2013.  . Troponin level elevated     a. 04/2014, 12/2014: felt due to CHF.  . CKD (chronic kidney disease) stage 2, GFR 60-89 ml/min   . Aspirin allergy    Current Outpatient Prescriptions on File Prior to Visit  Medication Sig Dispense Refill  . atorvastatin (LIPITOR) 80 MG tablet TAKE 1 TABLET (80 MG TOTAL) BY MOUTH EVERY EVENING. (Patient taking differently: TAKE 1 TABLET (80 MG TOTAL) BY MOUTH DAILY) 30 tablet 2  . carvedilol (COREG) 6.25 MG tablet Take 3 tablets (18.75 mg total) by mouth 2 (two) times daily. 180 tablet 4  . hydrALAZINE (APRESOLINE) 25 MG tablet Take 1 tablet (25 mg total) by mouth 3 (three) times daily. 90 tablet 3  . lisinopril (PRINIVIL,ZESTRIL) 10 MG tablet Take 1 tablet (10 mg total) by mouth daily. 30 tablet 6  . OVER THE COUNTER MEDICATION Take 1 tablet by mouth 2 (two) times daily as needed (gout attacks). Herbal medication "Go out"    . polyethylene glycol (MIRALAX / GLYCOLAX) packet Take 17 g by mouth daily as needed. 30 each 0  . promethazine (PHENERGAN) 25  MG tablet Take 1 tablet (25 mg total) by mouth every 6 (six) hours as needed for nausea or vomiting. 30 tablet 3  . torsemide (DEMADEX) 20 MG tablet Take 0.5 tablets (10 mg total) by mouth daily. 60 tablet 3  . traMADol (ULTRAM) 50 MG tablet Take 1 tablet (50 mg total) by mouth every 6 (six) hours as needed. 45 tablet 0  . isosorbide mononitrate (IMDUR) 30 MG 24 hr tablet Take 1 tablet (30 mg total) by mouth daily. (Patient not taking: Reported on 05/21/2015) 30 tablet 11  . [DISCONTINUED] cetirizine (ZYRTEC ALLERGY) 10 MG tablet Take 1 tablet (10 mg total) by mouth daily. 30 tablet 1   No current facility-administered medications on file prior to visit.   Family History  Problem Relation  Age of Onset  . Heart attack Neg Hx   . Stroke Neg Hx   . Hypertension Mother   . Diabetes Maternal Aunt    Social History   Social History  . Marital Status: Single    Spouse Name: N/A  . Number of Children: N/A  . Years of Education: N/A   Occupational History  . Not on file.   Social History Main Topics  . Smoking status: Former Smoker -- 0.25 packs/day for 22 years    Quit date: 04/16/2015  . Smokeless tobacco: Never Used  . Alcohol Use: Yes     Comment: occ   . Drug Use: Yes    Special: Marijuana  . Sexual Activity: Yes    Birth Control/ Protection: Condom   Other Topics Concern  . Not on file   Social History Narrative    Review of Systems: Constitutional: Negative for fever, chills, diaphoresis, activity change, appetite change and fatigue. HENT: Negative for ear pain, nosebleeds, congestion, facial swelling, rhinorrhea, neck pain, neck stiffness and ear discharge.  Eyes: Negative for pain, discharge, redness, itching and visual disturbance. Respiratory: Negative for cough, choking, chest tightness, shortness of breath, wheezing and stridor.  Cardiovascular: Negative for chest pain, palpitations and leg swelling. Gastrointestinal: Negative for abdominal distention, +constipation. Genitourinary: Negative for dysuria, urgency, frequency, hematuria, flank pain, decreased urine volume, difficulty urinating and dyspareunia.  Musculoskeletal: Negative for back pain, joint swelling, arthralgia and gait problem. Neurological: Negative for dizziness, tremors, seizures, syncope, facial asymmetry, speech difficulty, weakness, light-headedness, numbness and headaches.  Hematological: Negative for adenopathy. Does not bruise/bleed easily. Skin: Negative for rash, ulcer. Psychiatric/Behavioral: Negative for hallucinations, behavioral problems, confusion, positive for anxiety   Objective:   Filed Vitals:   05/21/15 1536  BP: 115/75  Pulse: 86  Temp: 98.8 F (37.1 C)     Physical Exam: Constitutional: Patient appears well-developed and well-nourished. No distress. HENT: Normocephalic, atraumatic, External right and left ear normal. Oropharynx is clear and moist.  Eyes: Conjunctivae and EOM are normal. PERRLA, no scleral icterus. Neck: Normal ROM, No JVD. No tracheal deviation. No thyromegaly. CVS: RRR, S1/S2 +, no murmurs, no gallops, no carotid bruit.  Pulmonary: Clean dressing on anterior chest wall at the site of ICD placement, Effort and breath sounds normal, no stridor, rhonchi, wheezes, rales.  Abdominal: Soft. BS +, no distension, tenderness, rebound or guarding.  Musculoskeletal: Normal range of motion. No edema and no tenderness.  Lymphadenopathy: No lymphadenopathy noted, cervical, inguinal or axillary Neuro: Alert. Normal reflexes, muscle tone coordination. No cranial nerve deficit. Skin: Skin is warm and dry. No rash noted. Not diaphoretic. No erythema. No pallor. Psychiatric: Normal mood and affect. Behavior, judgment, thought content normal.  Lab Results  Component Value Date   WBC 8.7 05/18/2015   HGB 13.1 05/18/2015   HCT 40.7 05/18/2015   MCV 78.1 05/18/2015   PLT 280 05/18/2015   Lab Results  Component Value Date   CREATININE 1.77* 05/19/2015   BUN 18 05/19/2015   NA 137 05/19/2015   K 4.2 05/19/2015   CL 104 05/19/2015   CO2 24 05/19/2015    Lab Results  Component Value Date   HGBA1C 6.5* 05/15/2015   Lipid Panel     Component Value Date/Time   CHOL 142 03/31/2013 0849   TRIG 144.0 03/31/2013 0849   HDL 25.80* 03/31/2013 0849   CHOLHDL 6 03/31/2013 0849   VLDL 28.8 03/31/2013 0849   LDLCALC 87 03/31/2013 0849       Assessment and plan:  39 year old male with a history of HTN, Type 2 DM, NSTEMI,  Chronic systolic CHF (EF 33-35%) s/p ICD placement.   Acute on chronic systolic CHF status post ICD placement: No evidence of fluid overload at this time. Continue isosorbide mononitrate, hydralazine. Advised to  keep appointment with cardiology. Heart healthy, low-sodium, DASH diet and advised to comply with daily weight checks, limit fluids to 2 L per day.  Hypertension: Controlled. Continue antihypertensives  Type 2 diabetes mellitus: Controlled with A1c of 6.5. Continue Novolin 70/30.  Gout: Refilled allopurinol at renal dose. Advised to discontinue OTC Medications Which He Had Been Previously Taking for Gout attacks. No acute flare at this time.  Acute on chronic kidney disease: Avoid nephrotoxic agents.  Constipation: He prefers to use natural remedies including parents choose and so I will hold off on prescribing lactulose.  The patient was given clear instructions to go to ER or return to medical center if symptoms don't improve, worsen or new problems develop. The patient verbalized understanding. The patient was told to call to get lab results if they haven't heard anything in the next week.     Arnoldo Morale, [facility] [phone] 05/21/2015, 4:05 PM

## 2015-05-21 NOTE — Progress Notes (Signed)
ASSESSMENT: Pt currently experiencing symptoms of anxiety. Pt needs to f/u with PCP; would benefit from f/u with [facility] as well as psychoeducation and supportive counseling regarding coping with symptoms of anxiety.  Stage of Change: contemplative  PLAN: 1. F/U with behavioral health consultant in as needed 2. Psychiatric Medications: n/a. 3. Behavioral recommendation(s):   -Do daily relaxation breathing exercises, as practiced in office visit -Pick up educational materials regarding coping with symptoms of anxiety and next visit w [facility]  SUBJECTIVE: Pt. referred by Dr Jarold Song for symptoms of anxiety:  Pt. reports the following symptoms/concerns: Pt states that he sometimes has anxiety that leads to panic attacks when he is in his car; he self-medicates anxiety with marijuana, but says he is open to learn other techniques to cope with anxiety.  Duration of problem: increasing over last two months Severity: moderate  OBJECTIVE: Orientation & Cognition: Oriented x3. Thought processes normal and appropriate to situation. Mood: appropriate. Affect: appropriate Appearance: appropriate Risk of harm to self or others: no risk of harm to self or others Substance use: alcohol, marijuana Assessments administered: PHQ9: 4/ GAD7: 14  Diagnosis: Generalized anxiety disorder CPT Code: F41.1 -------------------------------------------- Other(s) present in the room: none  Time spent with patient in exam room: 20 minutes

## 2015-05-29 ENCOUNTER — Ambulatory Visit (INDEPENDENT_AMBULATORY_CARE_PROVIDER_SITE_OTHER): Payer: Self-pay | Admitting: *Deleted

## 2015-05-29 DIAGNOSIS — I5022 Chronic systolic (congestive) heart failure: Secondary | ICD-10-CM

## 2015-05-29 DIAGNOSIS — I429 Cardiomyopathy, unspecified: Secondary | ICD-10-CM

## 2015-05-29 DIAGNOSIS — I428 Other cardiomyopathies: Secondary | ICD-10-CM

## 2015-05-29 DIAGNOSIS — Z95818 Presence of other cardiac implants and grafts: Secondary | ICD-10-CM

## 2015-05-29 DIAGNOSIS — Z959 Presence of cardiac and vascular implant and graft, unspecified: Secondary | ICD-10-CM

## 2015-05-29 LAB — CUP PACEART INCLINIC DEVICE CHECK
Date Time Interrogation Session: 20160824123745
MDC IDC PG SERIAL: 117162

## 2015-05-29 NOTE — Progress Notes (Signed)
S-ICD Wound check appointment. No Steri-strips, dermabond used. Wound without redness or edema. Incision edges approximated, wound well healed. 0 untreated episodes; 0 treated episodes; 0 shocks delivered. Electrode impedance status okay. No programming changes. Remaining longevity to ERI 100%. ROV w/ SK 09/10/15.

## 2015-06-04 ENCOUNTER — Other Ambulatory Visit: Payer: Self-pay | Admitting: Internal Medicine

## 2015-06-07 ENCOUNTER — Encounter (HOSPITAL_COMMUNITY): Payer: Self-pay

## 2015-06-07 NOTE — Progress Notes (Signed)
[facility] medical record request received to CHF clinic for Medicare claim. All records available by our office/providers 10/05/14 - present faxed to provided # [phone]. Case # [id number] Copy of request scanned into electronic medical records.  Renee Pain

## 2015-06-16 ENCOUNTER — Emergency Department (HOSPITAL_COMMUNITY)
Admission: EM | Admit: 2015-06-16 | Discharge: 2015-06-17 | Disposition: A | Discharge status: Home / Self Care | Payer: Self-pay | Attending: Emergency Medicine | Admitting: Emergency Medicine

## 2015-06-16 ENCOUNTER — Emergency Department (HOSPITAL_COMMUNITY): Payer: Self-pay

## 2015-06-16 ENCOUNTER — Encounter (HOSPITAL_COMMUNITY): Payer: Self-pay | Admitting: Nurse Practitioner

## 2015-06-16 DIAGNOSIS — E119 Type 2 diabetes mellitus without complications: Secondary | ICD-10-CM | POA: Insufficient documentation

## 2015-06-16 DIAGNOSIS — Y939 Activity, unspecified: Secondary | ICD-10-CM | POA: Insufficient documentation

## 2015-06-16 DIAGNOSIS — Z95810 Presence of automatic (implantable) cardiac defibrillator: Secondary | ICD-10-CM | POA: Insufficient documentation

## 2015-06-16 DIAGNOSIS — G4733 Obstructive sleep apnea (adult) (pediatric): Secondary | ICD-10-CM | POA: Insufficient documentation

## 2015-06-16 DIAGNOSIS — X58XXXA Exposure to other specified factors, initial encounter: Secondary | ICD-10-CM | POA: Insufficient documentation

## 2015-06-16 DIAGNOSIS — Y999 Unspecified external cause status: Secondary | ICD-10-CM | POA: Insufficient documentation

## 2015-06-16 DIAGNOSIS — Z87442 Personal history of urinary calculi: Secondary | ICD-10-CM | POA: Insufficient documentation

## 2015-06-16 DIAGNOSIS — Z87891 Personal history of nicotine dependence: Secondary | ICD-10-CM | POA: Insufficient documentation

## 2015-06-16 DIAGNOSIS — Z794 Long term (current) use of insulin: Secondary | ICD-10-CM | POA: Insufficient documentation

## 2015-06-16 DIAGNOSIS — N182 Chronic kidney disease, stage 2 (mild): Secondary | ICD-10-CM | POA: Insufficient documentation

## 2015-06-16 DIAGNOSIS — Z9889 Other specified postprocedural states: Secondary | ICD-10-CM | POA: Insufficient documentation

## 2015-06-16 DIAGNOSIS — Y929 Unspecified place or not applicable: Secondary | ICD-10-CM | POA: Insufficient documentation

## 2015-06-16 DIAGNOSIS — I129 Hypertensive chronic kidney disease with stage 1 through stage 4 chronic kidney disease, or unspecified chronic kidney disease: Secondary | ICD-10-CM | POA: Insufficient documentation

## 2015-06-16 DIAGNOSIS — M25571 Pain in right ankle and joints of right foot: Secondary | ICD-10-CM | POA: Insufficient documentation

## 2015-06-16 DIAGNOSIS — Z85528 Personal history of other malignant neoplasm of kidney: Secondary | ICD-10-CM | POA: Insufficient documentation

## 2015-06-16 DIAGNOSIS — J45909 Unspecified asthma, uncomplicated: Secondary | ICD-10-CM | POA: Insufficient documentation

## 2015-06-16 DIAGNOSIS — S76219A Strain of adductor muscle, fascia and tendon of unspecified thigh, initial encounter: Secondary | ICD-10-CM

## 2015-06-16 DIAGNOSIS — I5022 Chronic systolic (congestive) heart failure: Secondary | ICD-10-CM | POA: Insufficient documentation

## 2015-06-16 DIAGNOSIS — Z9981 Dependence on supplemental oxygen: Secondary | ICD-10-CM | POA: Insufficient documentation

## 2015-06-16 DIAGNOSIS — E780 Pure hypercholesterolemia: Secondary | ICD-10-CM | POA: Insufficient documentation

## 2015-06-16 DIAGNOSIS — Z79899 Other long term (current) drug therapy: Secondary | ICD-10-CM | POA: Insufficient documentation

## 2015-06-16 DIAGNOSIS — S76211A Strain of adductor muscle, fascia and tendon of right thigh, initial encounter: Secondary | ICD-10-CM | POA: Insufficient documentation

## 2015-06-16 LAB — CBC
HCT: 43 % (ref 39.0–52.0)
Hemoglobin: 14.9 g/dL (ref 13.0–17.0)
MCH: 26 pg (ref 26.0–34.0)
MCHC: 34.7 g/dL (ref 30.0–36.0)
MCV: 75 fL — AB (ref 78.0–100.0)
PLATELETS: 320 10*3/uL (ref 150–400)
RBC: 5.73 MIL/uL (ref 4.22–5.81)
RDW: 16.2 % — ABNORMAL HIGH (ref 11.5–15.5)
WBC: 13.8 10*3/uL — AB (ref 4.0–10.5)

## 2015-06-16 LAB — BASIC METABOLIC PANEL
Anion gap: 10 (ref 5–15)
BUN: 20 mg/dL (ref 6–20)
CHLORIDE: 102 mmol/L (ref 101–111)
CO2: 21 mmol/L — ABNORMAL LOW (ref 22–32)
CREATININE: 1.9 mg/dL — AB (ref 0.61–1.24)
Calcium: 9.2 mg/dL (ref 8.9–10.3)
GFR, EST AFRICAN AMERICAN: 49 mL/min — AB (ref >60)
GFR, EST NON AFRICAN AMERICAN: 43 mL/min — AB (ref >60)
Glucose, Bld: 120 mg/dL — ABNORMAL HIGH (ref 65–99)
POTASSIUM: 3.9 mmol/L (ref 3.5–5.1)
SODIUM: 133 mmol/L — AB (ref 135–145)

## 2015-06-16 LAB — I-STAT TROPONIN, ED: Troponin i, poc: 0.02 ng/mL (ref 0.00–0.08)

## 2015-06-16 MED ORDER — METHOCARBAMOL 500 MG PO TABS
750.0000 mg | ORAL_TABLET | Freq: Once | ORAL | Status: AC
  Administered 2015-06-16: 750 mg via ORAL
  Filled 2015-06-16: qty 2

## 2015-06-16 MED ORDER — FENTANYL CITRATE (PF) 100 MCG/2ML IJ SOLN
50.0000 ug | Freq: Once | INTRAMUSCULAR | Status: AC
  Administered 2015-06-16: 50 ug via INTRAMUSCULAR
  Filled 2015-06-16: qty 2

## 2015-06-16 MED ORDER — HYDROCODONE-ACETAMINOPHEN 5-325 MG PO TABS
1.0000 | ORAL_TABLET | Freq: Once | ORAL | Status: AC
Start: 2015-06-16 — End: 2015-06-16
  Administered 2015-06-16: 1 via ORAL
  Filled 2015-06-16: qty 1

## 2015-06-16 NOTE — ED Provider Notes (Signed)
CSN: [id number]     Arrival date & time 06/16/15  1738 History   First MD Initiated Contact with Patient 06/16/15 2017     Chief Complaint  Patient presents with  . Hip Pain     (Consider location/radiation/quality/duration/timing/severity/associated sxs/prior Treatment) HPI Comments: This is a 39 year old morbidly obese male with extensive cardiac history.  Most recent having a ICD implanted presents with 2 days of right hip groin pain radiating to his foot on the right.  Denies any injury.  Denies any back pain, no loss of bladder or bowel control.  No numbness or tingling.  Denies rash.  Denies testicular pain or dysuria. He also is complaining of left pectoral muscle soreness.  Denies cardiac type chest pain  Patient is a 39 y.o. male presenting with hip pain. The history is provided by the patient.  Hip Pain This is a new problem. The current episode started yesterday. The problem has been unchanged. Pertinent negatives include no chest pain, chills, coughing, fatigue, fever, joint swelling, numbness, rash, swollen glands or weakness. The symptoms are aggravated by walking, twisting and exertion. He has tried NSAIDs for the symptoms. The treatment provided no relief.    Past Medical History  Diagnosis Date  . Hypertension   . Morbid obesity   . Presumed NICM     a. 04/2014 Myoview: EF 26%, glob HK, sev glob HK, ? prior infarct;  b. Never cathed 2/2 CKD.  [name] Chronic systolic CHF (congestive heart failure)     a. EF 20-25% in 2012. b. EF 45-50% in 10/2011 with nonischemic nuc - presumed NICM. c. 12/2014 Echo: Sev depressed LV fxn, sev dil LV, mild LVH, mild MR, sev dil LA, mildly reduced RV fxn.  . Nephrolithiasis   . OSA on CPAP   . High cholesterol   . Childhood asthma   . Type II diabetes mellitus   . Anxiety   . Renal cell carcinoma     a. s/p Rt robotic assisted partial converted to radical nephrectomy on 01/2013.  . Troponin level elevated     a. 04/2014, 12/2014: felt due to  CHF.  . CKD (chronic kidney disease) stage 2, GFR 60-89 ml/min   . Aspirin allergy    Past Surgical History  Procedure Laterality Date  . Robotic assited partial nephrectomy Right 01/13/2013    Procedure: ROBOTIC ASSITED PARTIAL NEPHRECTOMY CONVERTED TO ROBOTIC ASSISTED RIGHT RADICAL NEPHRECTOMY;  Surgeon: Alexis Frock, MD;  Location: [facility];  Service: Urology;  Laterality: Right;  . Robotic assisted laparoscopic lysis of adhesion  01/13/2013    Procedure: ROBOTIC ASSISTED LAPAROSCOPIC LYSIS OF ADHESION EXTENSIVE;  Surgeon: Alexis Frock, MD;  Location: [facility];  Service: Urology;;  . Appendectomy  07/2004  . Cardiac catheterization N/A 05/17/2015    Procedure: Right/Left Heart Cath and Coronary Angiography;  Surgeon: Jolaine Artist, MD;  Location: [place] [facility];  Service: Cardiovascular;  Laterality: N/A;  . Ep implantable device N/A 05/17/2015    Procedure: SubQ ICD Implant;  Surgeon: Will Meredith Leeds, MD;  Location: [place] [facility];  Service: Cardiovascular;  Laterality: N/A;   Family History  Problem Relation Age of Onset  . Heart attack Neg Hx   . Stroke Neg Hx   . Hypertension Mother   . Diabetes Maternal Aunt    Social History  Substance Use Topics  . Smoking status: Former Smoker -- 0.25 packs/day for 22 years    Quit date: 04/16/2015  . Smokeless tobacco: Never Used  .  Alcohol Use: Yes     Comment: occ     Review of Systems  Constitutional: Negative for fever, chills and fatigue.  Respiratory: Positive for shortness of breath. Negative for cough.   Cardiovascular: Negative for chest pain and leg swelling.  Genitourinary: Negative for dysuria, scrotal swelling and testicular pain.  Musculoskeletal: Positive for gait problem. Negative for back pain and joint swelling.  Skin: Negative for rash and wound.  Neurological: Negative for dizziness, weakness and numbness.  All other systems reviewed and are negative.     Allergies  Aspirin; Bee venom; and  Tomato  Home Medications   Prior to Admission medications   Medication Sig Start Date End Date Taking? Authorizing Provider  allopurinol (ZYLOPRIM) 100 MG tablet Take 1 tablet (100 mg total) by mouth daily. 05/21/15  Yes Arnoldo Morale, MD  atorvastatin (LIPITOR) 80 MG tablet TAKE 1 TABLET (80 MG TOTAL) BY MOUTH EVERY EVENING. 06/05/15  Yes Fay Records, MD  carvedilol (COREG) 6.25 MG tablet Take 3 tablets (18.75 mg total) by mouth 2 (two) times daily. 12/30/14  Yes Ripudeep Krystal Eaton, MD  hydrALAZINE (APRESOLINE) 25 MG tablet Take 1 tablet (25 mg total) by mouth 3 (three) times daily. 05/07/15  Yes Larey Dresser, MD  insulin NPH-regular Human (NOVOLIN 70/30) (70-30) 100 UNIT/ML injection Inject 34 Units into the skin 2 (two) times daily with a meal. 05/21/15  Yes Arnoldo Morale, MD  lisinopril (PRINIVIL,ZESTRIL) 10 MG tablet Take 1 tablet (10 mg total) by mouth daily. 03/11/15  Yes Jolaine Artist, MD  OVER THE COUNTER MEDICATION Take 1 tablet by mouth 2 (two) times daily as needed (gout attacks). Herbal medication "Go out"   Yes Historical Provider, MD  polyethylene glycol (MIRALAX / GLYCOLAX) packet Take 17 g by mouth daily as needed. 05/19/15  Yes Ripudeep Krystal Eaton, MD  promethazine (PHENERGAN) 25 MG tablet Take 1 tablet (25 mg total) by mouth every 6 (six) hours as needed for nausea or vomiting. 05/19/15  Yes Ripudeep Krystal Eaton, MD  torsemide (DEMADEX) 20 MG tablet Take 0.5 tablets (10 mg total) by mouth daily. 05/19/15  Yes Ripudeep Krystal Eaton, MD  traMADol (ULTRAM) 50 MG tablet Take 1 tablet (50 mg total) by mouth every 6 (six) hours as needed. 05/19/15  Yes Ripudeep Krystal Eaton, MD  HYDROcodone-acetaminophen (NORCO/VICODIN) 5-325 MG per tablet Take 1 tablet by mouth every 6 (six) hours as needed for moderate pain. 06/17/15   Junius Creamer, NP  isosorbide mononitrate (IMDUR) 30 MG 24 hr tablet Take 1 tablet (30 mg total) by mouth daily. Patient not taking: Reported on 05/21/2015 01/22/15   Rogelia Mire, NP    methocarbamol (ROBAXIN) 750 MG tablet Take 1 tablet (750 mg total) by mouth 3 (three) times daily. 06/17/15   Junius Creamer, NP   BP 113/70 mmHg  Pulse 75  Temp(Src) 98.3 F (36.8 C) (Oral)  Resp 28  Ht 5\' 7"  (1.702 m)  Wt 285 lb (129.275 kg)  BMI 44.63 kg/m2  SpO2 93% Physical Exam  Constitutional: He appears well-developed and well-nourished.  HENT:  Head: Normocephalic.  Eyes: Pupils are equal, round, and reactive to light.  Neck: Normal range of motion.  Cardiovascular: Normal rate and regular rhythm.   Pulmonary/Chest: Effort normal and breath sounds normal.  Abdominal: Soft. Bowel sounds are normal. He exhibits no distension. There is no tenderness. Hernia confirmed negative in the right inguinal area.  Genitourinary: Penis normal.    Right testis shows no swelling and  no tenderness. Left testis shows no swelling and no tenderness.  Musculoskeletal: He exhibits tenderness.       Right hip: He exhibits decreased range of motion and tenderness. He exhibits no swelling, no crepitus and no deformity.       Legs: Lymphadenopathy:       Right: No inguinal adenopathy present.  Neurological: He is alert.  Skin: Skin is warm. No rash noted. No erythema.  Nursing note and vitals reviewed.   ED Course  Procedures (including critical care time) Labs Review Labs Reviewed  BASIC METABOLIC PANEL - Abnormal; Notable for the following:    Sodium 133 (*)    CO2 21 (*)    Glucose, Bld 120 (*)    Creatinine, Ser 1.90 (*)    GFR calc non Af Amer 43 (*)    GFR calc Af Amer 49 (*)    All other components within normal limits  CBC - Abnormal; Notable for the following:    WBC 13.8 (*)    MCV 75.0 (*)    RDW 16.2 (*)    All other components within normal limits  Randolm Idol, ED    Imaging Review Dg Chest 2 View  06/16/2015   CLINICAL DATA:  Shortness of breath.  EXAM: CHEST  2 VIEW  COMPARISON:  May 18, 2015.  FINDINGS: The heart size and mediastinal contours are within  normal limits. Both lungs are clear. No pneumothorax or pleural effusion is noted. Continued presence of left-sided pacemaker seen in lateral position. The visualized skeletal structures are unremarkable.  IMPRESSION: No active cardiopulmonary disease.   Electronically Signed   By: Marijo Conception, M.D.   On: 06/16/2015 19:20   Dg Hip Unilat  With Pelvis 2-3 Views Right  06/16/2015   CLINICAL DATA:  RIGHT hip pain.  Initial encounter.  EXAM: DG HIP (WITH OR WITHOUT PELVIS) 2-3V RIGHT  COMPARISON:  None.  FINDINGS: There is no evidence of hip fracture or dislocation. There is no evidence of arthropathy or other focal bone abnormality.  IMPRESSION: Negative.   Electronically Signed   By: Dereck Ligas M.D.   On: 06/16/2015 19:22   I have personally reviewed and evaluated these images and lab results as part of my medical decision-making.   EKG Interpretation   Date/Time:  Sunday June 16 2015 18:33:55 EDT Ventricular Rate:  84 PR Interval:  154 QRS Duration: 92 QT Interval:  416 QTC Calculation: 491 R Axis:   72 Text Interpretation:  Normal sinus rhythm Biatrial enlargement Nonspecific  T wave abnormality Prolonged QT Abnormal ECG Confirmed by Hazle Coca  [phone]) on 06/16/2015 6:39:40 PM     Cath as of 05/2015  Normal vessels Echo   EF 20-25%  Since I has been Ace wrap and ice applied.  He's been given muscle relaxer, nothing by mouth narcotic pain medication as well as muscle relaxer says been discussed with him long-term plan is for him to follow-up with orthopedics to get into a physical therapy program to show him some gentle stretches that he can perform on a daily basis to help heal.  He is also been provided with crutches to help with ambulation in the immediate future MDM   Final diagnoses:  Groin strain, initial encounter         Junius Creamer, NP 06/17/15 0030  Gareth Morgan, MD 06/18/15 2233

## 2015-06-16 NOTE — ED Notes (Addendum)
Pt reports constant R hip pain radiating to foot x 2-3 days. He denies any injuries. He reports he also noticed some CP and sob x 2 days, intermittent, describes as soreness, but he is mostly here for the hip pain. . A&Ox4, mild labored breathing with full sentences

## 2015-06-16 NOTE — ED Notes (Signed)
NP at the bedside

## 2015-06-17 ENCOUNTER — Encounter (HOSPITAL_COMMUNITY): Payer: Self-pay | Admitting: Emergency Medicine

## 2015-06-17 ENCOUNTER — Emergency Department (HOSPITAL_COMMUNITY)
Admission: EM | Admit: 2015-06-17 | Discharge: 2015-06-18 | Disposition: A | Discharge status: Home / Self Care | Payer: Self-pay | Attending: Emergency Medicine | Admitting: Emergency Medicine

## 2015-06-17 DIAGNOSIS — Z9889 Other specified postprocedural states: Secondary | ICD-10-CM | POA: Insufficient documentation

## 2015-06-17 DIAGNOSIS — I129 Hypertensive chronic kidney disease with stage 1 through stage 4 chronic kidney disease, or unspecified chronic kidney disease: Secondary | ICD-10-CM | POA: Insufficient documentation

## 2015-06-17 DIAGNOSIS — Z79899 Other long term (current) drug therapy: Secondary | ICD-10-CM | POA: Insufficient documentation

## 2015-06-17 DIAGNOSIS — S76111A Strain of right quadriceps muscle, fascia and tendon, initial encounter: Secondary | ICD-10-CM | POA: Insufficient documentation

## 2015-06-17 DIAGNOSIS — F1210 Cannabis abuse, uncomplicated: Secondary | ICD-10-CM | POA: Insufficient documentation

## 2015-06-17 DIAGNOSIS — Z9981 Dependence on supplemental oxygen: Secondary | ICD-10-CM | POA: Insufficient documentation

## 2015-06-17 DIAGNOSIS — X58XXXA Exposure to other specified factors, initial encounter: Secondary | ICD-10-CM | POA: Insufficient documentation

## 2015-06-17 DIAGNOSIS — Z87442 Personal history of urinary calculi: Secondary | ICD-10-CM | POA: Insufficient documentation

## 2015-06-17 DIAGNOSIS — G4733 Obstructive sleep apnea (adult) (pediatric): Secondary | ICD-10-CM | POA: Insufficient documentation

## 2015-06-17 DIAGNOSIS — Y9389 Activity, other specified: Secondary | ICD-10-CM | POA: Insufficient documentation

## 2015-06-17 DIAGNOSIS — Y9289 Other specified places as the place of occurrence of the external cause: Secondary | ICD-10-CM | POA: Insufficient documentation

## 2015-06-17 DIAGNOSIS — N182 Chronic kidney disease, stage 2 (mild): Secondary | ICD-10-CM | POA: Insufficient documentation

## 2015-06-17 DIAGNOSIS — Z794 Long term (current) use of insulin: Secondary | ICD-10-CM | POA: Insufficient documentation

## 2015-06-17 DIAGNOSIS — E119 Type 2 diabetes mellitus without complications: Secondary | ICD-10-CM | POA: Insufficient documentation

## 2015-06-17 DIAGNOSIS — Z87891 Personal history of nicotine dependence: Secondary | ICD-10-CM | POA: Insufficient documentation

## 2015-06-17 DIAGNOSIS — F1110 Opioid abuse, uncomplicated: Secondary | ICD-10-CM | POA: Insufficient documentation

## 2015-06-17 DIAGNOSIS — Z85528 Personal history of other malignant neoplasm of kidney: Secondary | ICD-10-CM | POA: Insufficient documentation

## 2015-06-17 DIAGNOSIS — Y998 Other external cause status: Secondary | ICD-10-CM | POA: Insufficient documentation

## 2015-06-17 DIAGNOSIS — E780 Pure hypercholesterolemia: Secondary | ICD-10-CM | POA: Insufficient documentation

## 2015-06-17 DIAGNOSIS — I5022 Chronic systolic (congestive) heart failure: Secondary | ICD-10-CM | POA: Insufficient documentation

## 2015-06-17 MED ORDER — HYDROCODONE-ACETAMINOPHEN 5-325 MG PO TABS
1.0000 | ORAL_TABLET | Freq: Once | ORAL | Status: AC
  Administered 2015-06-17: 1 via ORAL
  Filled 2015-06-17: qty 1

## 2015-06-17 MED ORDER — HYDROCODONE-ACETAMINOPHEN 5-325 MG PO TABS: 1.0000 | ORAL_TABLET | Freq: Four times a day (QID) | ORAL | Status: DC | PRN

## 2015-06-17 MED ORDER — METHOCARBAMOL 750 MG PO TABS: 750.0000 mg | ORAL_TABLET | Freq: Three times a day (TID) | ORAL | Status: DC

## 2015-06-17 NOTE — ED Notes (Signed)
Pt c/o left leg pain radiating from hip to knee, increased pain with movement of ankle, unable to bear weight.   Last VS: 138/112, 88hr, 18resp, 98% ra

## 2015-06-17 NOTE — ED Notes (Signed)
Bed: [facility] Expected date:  Expected time:  Means of arrival:  Comments: EMS leg pain

## 2015-06-17 NOTE — ED Notes (Signed)
Pt states he has crutches at home and doesn't usually do too well with them but given them to patient.

## 2015-06-17 NOTE — Discharge Instructions (Signed)
Cryotherapy Cryotherapy is when you put ice on your injury. Ice helps lessen pain and puffiness (swelling) after an injury. Ice works the best when you start using it in the first 24 to 48 hours after an injury. HOME CARE  Put a dry or damp towel between the ice pack and your skin.  You may press gently on the ice pack.  Leave the ice on for no more than 10 to 20 minutes at a time.  Check your skin after 5 minutes to make sure your skin is okay.  Rest at least 20 minutes between ice pack uses.  Stop using ice when your skin loses feeling (numbness).  Do not use ice on someone who cannot tell you when it hurts. This includes small children and people with memory problems (dementia). GET HELP RIGHT AWAY IF:  You have white spots on your skin.  Your skin turns blue or pale.  Your skin feels waxy or hard.  Your puffiness gets worse. MAKE SURE YOU:   Understand these instructions.  Will watch your condition.  Will get help right away if you are not doing well or get worse. Document Released: 03/09/2008 Document Revised: 12/14/2011 Document Reviewed: 05/14/2011 [facility] Patient Information 2015 [place], [place]. This information is not intended to replace advice given to you by your health care provider. Make sure you discuss any questions you have with your health care provider. Follow up with Dr. Percell Miller for physical therapy evaluation

## 2015-06-18 ENCOUNTER — Ambulatory Visit (HOSPITAL_COMMUNITY)
Admission: RE | Admit: 2015-06-18 | Discharge: 2015-06-18 | Disposition: A | Discharge status: Home / Self Care | Payer: Self-pay | Source: Ambulatory Visit | Attending: Cardiology | Admitting: Cardiology

## 2015-06-18 VITALS — BP 118/88 | HR 88 | Wt 289.1 lb

## 2015-06-18 DIAGNOSIS — M109 Gout, unspecified: Secondary | ICD-10-CM | POA: Insufficient documentation

## 2015-06-18 DIAGNOSIS — G4733 Obstructive sleep apnea (adult) (pediatric): Secondary | ICD-10-CM | POA: Insufficient documentation

## 2015-06-18 DIAGNOSIS — Z720 Tobacco use: Secondary | ICD-10-CM | POA: Insufficient documentation

## 2015-06-18 DIAGNOSIS — E119 Type 2 diabetes mellitus without complications: Secondary | ICD-10-CM | POA: Insufficient documentation

## 2015-06-18 DIAGNOSIS — Z794 Long term (current) use of insulin: Secondary | ICD-10-CM | POA: Insufficient documentation

## 2015-06-18 DIAGNOSIS — I5022 Chronic systolic (congestive) heart failure: Secondary | ICD-10-CM | POA: Insufficient documentation

## 2015-06-18 DIAGNOSIS — I10 Essential (primary) hypertension: Secondary | ICD-10-CM

## 2015-06-18 DIAGNOSIS — N183 Chronic kidney disease, stage 3 unspecified: Secondary | ICD-10-CM

## 2015-06-18 DIAGNOSIS — I129 Hypertensive chronic kidney disease with stage 1 through stage 4 chronic kidney disease, or unspecified chronic kidney disease: Secondary | ICD-10-CM | POA: Insufficient documentation

## 2015-06-18 LAB — RAPID URINE DRUG SCREEN, HOSP PERFORMED
Amphetamines: NOT DETECTED
BENZODIAZEPINES: NOT DETECTED
Barbiturates: NOT DETECTED
COCAINE: NOT DETECTED
OPIATES: POSITIVE — AB
Tetrahydrocannabinol: POSITIVE — AB

## 2015-06-18 LAB — URINALYSIS, ROUTINE W REFLEX MICROSCOPIC
Bilirubin Urine: NEGATIVE
GLUCOSE, UA: NEGATIVE mg/dL
Hgb urine dipstick: NEGATIVE
Ketones, ur: NEGATIVE mg/dL
LEUKOCYTES UA: NEGATIVE
Nitrite: NEGATIVE
PH: 6 (ref 5.0–8.0)
PROTEIN: 30 mg/dL — AB
SPECIFIC GRAVITY, URINE: 1.011 (ref 1.005–1.030)
Urobilinogen, UA: 1 mg/dL (ref 0.0–1.0)

## 2015-06-18 LAB — URINE MICROSCOPIC-ADD ON

## 2015-06-18 MED ORDER — IBUPROFEN 600 MG PO TABS: 600.0000 mg | ORAL_TABLET | Freq: Four times a day (QID) | ORAL | Status: DC | PRN

## 2015-06-18 MED ORDER — DIAZEPAM 5 MG PO TABS
5.0000 mg | ORAL_TABLET | Freq: Once | ORAL | Status: AC
Start: 2015-06-18 — End: 2015-06-18
  Administered 2015-06-18: 5 mg via ORAL
  Filled 2015-06-18: qty 1

## 2015-06-18 MED ORDER — SODIUM CHLORIDE 0.9 % IV SOLN: 8.0000 mg | Freq: Once | INTRAVENOUS | Status: DC

## 2015-06-18 MED ORDER — HYDROMORPHONE HCL 2 MG/ML IJ SOLN
2.0000 mg | Freq: Once | INTRAMUSCULAR | Status: DC
  Filled 2015-06-18: qty 1

## 2015-06-18 MED ORDER — SPIRONOLACTONE 25 MG PO TABS: 25.0000 mg | ORAL_TABLET | Freq: Every day | ORAL | Status: DC

## 2015-06-18 MED ORDER — KETOROLAC TROMETHAMINE 30 MG/ML IJ SOLN
60.0000 mg | Freq: Once | INTRAMUSCULAR | Status: AC
  Administered 2015-06-18: 60 mg via INTRAMUSCULAR

## 2015-06-18 MED ORDER — HYDROMORPHONE HCL 2 MG/ML IJ SOLN
2.0000 mg | Freq: Once | INTRAMUSCULAR | Status: AC
  Administered 2015-06-18: 2 mg via INTRAMUSCULAR

## 2015-06-18 MED ORDER — ONDANSETRON HCL 4 MG/2ML IJ SOLN: 4.0000 mg | Freq: Once | INTRAMUSCULAR | Status: DC

## 2015-06-18 MED ORDER — ONDANSETRON 8 MG PO TBDP
8.0000 mg | ORAL_TABLET | Freq: Once | ORAL | Status: AC
  Administered 2015-06-18: 8 mg via ORAL

## 2015-06-18 MED ORDER — KETOROLAC TROMETHAMINE 30 MG/ML IJ SOLN
30.0000 mg | Freq: Once | INTRAMUSCULAR | Status: DC
  Filled 2015-06-18: qty 1

## 2015-06-18 NOTE — Progress Notes (Signed)
Advanced Heart Failure Medication Review by a Pharmacist  Does the patient  feel that his/her medications are working for him/her?  yes  Has the patient been experiencing any side effects to the medications prescribed?  no  Does the patient measure his/her own blood pressure or blood glucose at home?  no   Does the patient have any problems obtaining medications due to transportation or finances?   no  Understanding of regimen: good Understanding of indications: good Potential of compliance: good    Pharmacist comments:  Jeffrey [name] is a pleasant 39 yo M presenting without a medication list. He is able to verbalize most of his medications to me and seems to understand the importance of continued use. He did not have any specific medication-related questions or concerns for me at this time.   Ruta Hinds. Velva Harman, PharmD, BCPS, CPP Clinical Pharmacist Pager: [phone] Phone: [phone] 06/18/2015 3:11 PM

## 2015-06-18 NOTE — ED Notes (Signed)
Bed: [facility]19 Expected date:  Expected time:  Means of arrival:  Comments: 

## 2015-06-18 NOTE — ED Provider Notes (Addendum)
CSN: [id number]     Arrival date & time 06/17/15  2347 History   This chart was scribed for Jeffrey Biles, MD by Eustaquio Maize, ED Scribe. This patient was seen in room WA19/WA19 and the patient's care was started at 3:20 AM.  Chief Complaint  Patient presents with  . Leg Pain   The history is provided by the patient. No language interpreter was used.     HPI Comments: SIRR KABEL is a 39 y.o. male who presents to the Emergency Department complaining of sudden onset, constant, sharp and throbbing, right hip pain radiating down to right foot x 3 days, gradually worsening. Pt states he was laying down and tried to sit up when the pain came on. He notes that swelling started instantly. Pt has taken Ibuprofen for the swelling without relief. He denies any recent injury, trauma, or fall. Denies back pain, numbness, weakness, tingling, fever, chills, or any other associated symptoms. Pt was seen in ED approximately 2 days ago for same symptoms. He had DG R Pelvis with no acute findings and was discharged home with groin strain. Pt was given crutches during his visit but states he hasn't been using them because it is difficult. He hasnt filled his prescription, as he has no money, and he reports that he called orthopedic clinic for appointment, but havent heard back from them.  Past Medical History  Diagnosis Date  . Hypertension   . Morbid obesity   . Presumed NICM     a. 04/2014 Myoview: EF 26%, glob HK, sev glob HK, ? prior infarct;  b. Never cathed 2/2 CKD.  [name] Chronic systolic CHF (congestive heart failure)     a. EF 20-25% in 2012. b. EF 45-50% in 10/2011 with nonischemic nuc - presumed NICM. c. 12/2014 Echo: Sev depressed LV fxn, sev dil LV, mild LVH, mild MR, sev dil LA, mildly reduced RV fxn.  . Nephrolithiasis   . OSA on CPAP   . High cholesterol   . Childhood asthma   . Type II diabetes mellitus   . Anxiety   . Renal cell carcinoma     a. s/p Rt robotic assisted partial converted to  radical nephrectomy on 01/2013.  . Troponin level elevated     a. 04/2014, 12/2014: felt due to CHF.  . CKD (chronic kidney disease) stage 2, GFR 60-89 ml/min   . Aspirin allergy    Past Surgical History  Procedure Laterality Date  . Robotic assited partial nephrectomy Right 01/13/2013    Procedure: ROBOTIC ASSITED PARTIAL NEPHRECTOMY CONVERTED TO ROBOTIC ASSISTED RIGHT RADICAL NEPHRECTOMY;  Surgeon: Alexis Frock, MD;  Location: [facility];  Service: Urology;  Laterality: Right;  . Robotic assisted laparoscopic lysis of adhesion  01/13/2013    Procedure: ROBOTIC ASSISTED LAPAROSCOPIC LYSIS OF ADHESION EXTENSIVE;  Surgeon: Alexis Frock, MD;  Location: [facility];  Service: Urology;;  . Appendectomy  07/2004  . Cardiac catheterization N/A 05/17/2015    Procedure: Right/Left Heart Cath and Coronary Angiography;  Surgeon: Jolaine Artist, MD;  Location: [place] [facility];  Service: Cardiovascular;  Laterality: N/A;  . Ep implantable device N/A 05/17/2015    Procedure: SubQ ICD Implant;  Surgeon: Will Meredith Leeds, MD;  Location: [place] [facility];  Service: Cardiovascular;  Laterality: N/A;   Family History  Problem Relation Age of Onset  . Heart attack Neg Hx   . Stroke Neg Hx   . Hypertension Mother   . Diabetes Maternal Aunt  Social History  Substance Use Topics  . Smoking status: Former Smoker -- 0.25 packs/day for 22 years    Quit date: 04/16/2015  . Smokeless tobacco: Never Used  . Alcohol Use: Yes     Comment: occ     Review of Systems  Constitutional: Negative for fever and chills.  Musculoskeletal: Positive for arthralgias (Right hip). Negative for back pain.  Skin: Negative for wound.  Neurological: Negative for weakness and numbness.    Allergies  Aspirin; Bee venom; and Tomato  Home Medications   Prior to Admission medications   Medication Sig Start Date End Date Taking? Authorizing Provider  allopurinol (ZYLOPRIM) 100 MG tablet Take 1 tablet (100 mg total) by  mouth daily. 05/21/15  Yes Arnoldo Morale, MD  atorvastatin (LIPITOR) 80 MG tablet TAKE 1 TABLET (80 MG TOTAL) BY MOUTH EVERY EVENING. 06/05/15  Yes Fay Records, MD  carvedilol (COREG) 6.25 MG tablet Take 3 tablets (18.75 mg total) by mouth 2 (two) times daily. 12/30/14  Yes Ripudeep Krystal Eaton, MD  hydrALAZINE (APRESOLINE) 25 MG tablet Take 1 tablet (25 mg total) by mouth 3 (three) times daily. 05/07/15  Yes Larey Dresser, MD  HYDROcodone-acetaminophen (NORCO/VICODIN) 5-325 MG per tablet Take 1 tablet by mouth every 6 (six) hours as needed for moderate pain. 06/17/15  Yes Junius Creamer, NP  insulin NPH-regular Human (NOVOLIN 70/30) (70-30) 100 UNIT/ML injection Inject 34 Units into the skin 2 (two) times daily with a meal. 05/21/15  Yes Arnoldo Morale, MD  lisinopril (PRINIVIL,ZESTRIL) 10 MG tablet Take 1 tablet (10 mg total) by mouth daily. 03/11/15  Yes Jolaine Artist, MD  methocarbamol (ROBAXIN) 750 MG tablet Take 1 tablet (750 mg total) by mouth 3 (three) times daily. 06/17/15  Yes Junius Creamer, NP  OVER THE COUNTER MEDICATION Take 1 tablet by mouth 2 (two) times daily as needed (gout attacks). Herbal medication "Go out"   Yes Historical Provider, MD  polyethylene glycol (MIRALAX / GLYCOLAX) packet Take 17 g by mouth daily as needed. Patient taking differently: Take 17 g by mouth daily as needed for mild constipation.  05/19/15  Yes Ripudeep Krystal Eaton, MD  promethazine (PHENERGAN) 25 MG tablet Take 1 tablet (25 mg total) by mouth every 6 (six) hours as needed for nausea or vomiting. 05/19/15  Yes Ripudeep Krystal Eaton, MD  torsemide (DEMADEX) 20 MG tablet Take 0.5 tablets (10 mg total) by mouth daily. 05/19/15  Yes Ripudeep Krystal Eaton, MD  traMADol (ULTRAM) 50 MG tablet Take 1 tablet (50 mg total) by mouth every 6 (six) hours as needed. Patient taking differently: Take 50 mg by mouth every 6 (six) hours as needed for moderate pain.  05/19/15  Yes Ripudeep Krystal Eaton, MD  ibuprofen (ADVIL,MOTRIN) 600 MG tablet Take 1 tablet (600 mg  total) by mouth every 6 (six) hours as needed. 06/18/15   Jeffrey Biles, MD  isosorbide mononitrate (IMDUR) 30 MG 24 hr tablet Take 1 tablet (30 mg total) by mouth daily. Patient not taking: Reported on 05/21/2015 01/22/15   Rogelia Mire, NP   Triage Vitals: BP 149/90 mmHg  Pulse 82  Temp(Src) 98.5 F (36.9 C) (Oral)  Resp 20  SpO2 98%   Physical Exam  Constitutional: He is oriented to person, place, and time. He appears well-developed and well-nourished. No distress.  HENT:  Head: Normocephalic and atraumatic.  Eyes: Conjunctivae and EOM are normal.  Neck: Neck supple. No tracheal deviation present.  Cardiovascular: Normal rate.   Pulmonary/Chest: Effort  normal. No respiratory distress.  Musculoskeletal: Normal range of motion.  RLE there is gross swelling and lumping just superior to the patella Clear indentation is seen proximal to the muscle lumping Pt has tenderness to palpation starting at the hip insertion point and continues all the way to the patella Skin is warm to touch 2+ DP pulses  Neurological: He is alert and oriented to person, place, and time.  Skin: Skin is warm and dry.  Psychiatric: He has a normal mood and affect. His behavior is normal.  Nursing note and vitals reviewed.   ULTRASOUND LIMITED SOFT TISSUE/ MUSCULOSKELETAL: Right thigh Indication: Right thigh lumping and pain Linear probe used to evaluate area of interest in two planes. Findings:  Large muscle tissue under the lump/edema - consistent with likely muscle tissue Performed by: Dr Kathrynn Humble Images saved electronically   ED Course  Procedures (including critical care time)  DIAGNOSTIC STUDIES: Oxygen Saturation is 98% on RA, normal by my interpretation.    COORDINATION OF CARE: 3:39 AM-Discussed treatment plan which includes knee immobilizer and follow up with orthopedist with pt at bedside and pt agreed to plan.   Labs Review Labs Reviewed  URINE RAPID DRUG SCREEN, HOSP PERFORMED -  Abnormal; Notable for the following:    Opiates POSITIVE (*)    Tetrahydrocannabinol POSITIVE (*)    All other components within normal limits  URINALYSIS, ROUTINE W REFLEX MICROSCOPIC (NOT AT [facility]) - Abnormal; Notable for the following:    APPearance CLOUDY (*)    Protein, ur 30 (*)    All other components within normal limits  URINE MICROSCOPIC-ADD ON - Abnormal; Notable for the following:    Casts GRANULAR CAST (*)    All other components within normal limits    Imaging Review Dg Chest 2 View  06/16/2015   CLINICAL DATA:  Shortness of breath.  EXAM: CHEST  2 VIEW  COMPARISON:  May 18, 2015.  FINDINGS: The heart size and mediastinal contours are within normal limits. Both lungs are clear. No pneumothorax or pleural effusion is noted. Continued presence of left-sided pacemaker seen in lateral position. The visualized skeletal structures are unremarkable.  IMPRESSION: No active cardiopulmonary disease.   Electronically Signed   By: Marijo Conception, M.D.   On: 06/16/2015 19:20   Dg Hip Unilat  With Pelvis 2-3 Views Right  06/16/2015   CLINICAL DATA:  RIGHT hip pain.  Initial encounter.  EXAM: DG HIP (WITH OR WITHOUT PELVIS) 2-3V RIGHT  COMPARISON:  None.  FINDINGS: There is no evidence of hip fracture or dislocation. There is no evidence of arthropathy or other focal bone abnormality.  IMPRESSION: Negative.   Electronically Signed   By: Dereck Ligas M.D.   On: 06/16/2015 19:22   I have personally reviewed and evaluated these lab results as part of my medical decision-making.   EKG Interpretation None      MDM   Final diagnoses:  Quadriceps muscle strain, right, initial encounter    Medical screening examination/treatment/procedure(s) were performed by me as the supervising physician. Scribe service was utilized for documentation only.  Pt with leg pain. Pain starts over the hip , anteriorly and shoots down the leg. He has clear lump noted to the distal thigh region - and i  think he has a partial quad rupture. He is able to extend his leg, and able to lift the leg slightly - complete tear is unlikely. He is not using crutches - and i advised that it is important  that he use them to move around. He states that he is not moving around much at all due to the pain. recommended RICE tx, and quick ortho f/u.    Jeffrey Biles, MD 06/18/15 [place], MD 06/18/15 [phone]

## 2015-06-18 NOTE — Discharge Instructions (Signed)
Call the Orthopedic doctor again. Let them know that the ER team has asked that you be seen this week for possible muscle tear which is preventing you from walking.  Use RICE treatment.  RICE: Routine Care for Injuries The routine care of many injuries includes Rest, Ice, Compression, and Elevation (RICE). HOME CARE INSTRUCTIONS  Rest is needed to allow your body to heal. Routine activities can usually be resumed when comfortable. Injured tendons and bones can take up to 6 weeks to heal. Tendons are the cord-like structures that attach muscle to bone.  Ice following an injury helps keep the swelling down and reduces pain.  Put ice in a plastic bag.  Place a towel between your skin and the bag.  Leave the ice on for 15-20 minutes, 3-4 times a day, or as directed by your health care provider. Do this while awake, for the first 24 to 48 hours. After that, continue as directed by your caregiver.  Compression helps keep swelling down. It also gives support and helps with discomfort. If an elastic bandage has been applied, it should be removed and reapplied every 3 to 4 hours. It should not be applied tightly, but firmly enough to keep swelling down. Watch fingers or toes for swelling, bluish discoloration, coldness, numbness, or excessive pain. If any of these problems occur, remove the bandage and reapply loosely. Contact your caregiver if these problems continue.  Elevation helps reduce swelling and decreases pain. With extremities, such as the arms, hands, legs, and feet, the injured area should be placed near or above the level of the heart, if possible. SEEK IMMEDIATE MEDICAL CARE IF:  You have persistent pain and swelling.  You develop redness, numbness, or unexpected weakness.  Your symptoms are getting worse rather than improving after several days. These symptoms may indicate that further evaluation or further X-rays are needed. Sometimes, X-rays may not show a small broken bone  (fracture) until 1 week or 10 days later. Make a follow-up appointment with your caregiver. Ask when your X-ray results will be ready. Make sure you get your X-ray results. Document Released: 01/03/2001 Document Revised: 09/26/2013 Document Reviewed: 02/20/2011 [facility] Patient Information 2015 [place], [place]. This information is not intended to replace advice given to you by your health care provider. Make sure you discuss any questions you have with your health care provider.   Quadriceps Strain with Rehab A strain is a tear in a muscle or the tendon that attaches the muscle to bone. A quadriceps strain is a tear in the muscles on the front of the thigh (quadriceps muscles) or their tendons. The quadriceps muscles are important for straightening the knee and bending the hip. The condition is characterized by pain, inflammation, and reduced function of these muscles. Strains are classified into three categories. Grade 1 strains cause pain, but the tendon is not lengthened. Grade 2 strains include a lengthened ligament due to the ligament being stretched or partially ruptured. With grade 2 strains there is still function, although the function may be diminished. Grade 3 strains are characterized by a complete tear of the tendon or muscle, and function is usually impaired.  SYMPTOMS   Pain, tenderness, inflammation, and/or bruising (contusion) over the quadriceps muscles  Pain that worsens with use of the quadriceps muscles.  Muscle spasm in the thigh.  Difficulty with common tasks that involve the quadriceps muscle, such as walking.  A crackling sound (crepitation) when the tendon is moved or touched.  Loss of fullness of the  muscle or bulging within the area of muscle with complete rupture. CAUSES  A strain occurs when a force is placed on the muscle or tendon that is greater than it can withstand. Common mechanisms of injury include:  Repetitive strenuous use of the quadriceps muscles.  This may be due to an increase in the intensity, frequency, or duration of exercise.  Direct trauma to the quadriceps muscles or tendons. RISK INCREASES WITH:  Activities that involve forceful contractions of the quadriceps muscles (jumping or sprinting).  Contact sports (soccer or football).  Poor strength and flexibility.  Failure to warm-up properly before activity.  Previous injury to the thigh or knee. PREVENTION  Warm up and stretch properly before activity.  Allow for adequate recovery between workouts.  Maintain physical fitness:  Strength, flexibility, and endurance.  Cardiovascular fitness.  Wear properly fitted and padded protective equipment. PROGNOSIS  If treated properly, then quadriceps muscles strains are usually curable within 6 weeks.  RELATED COMPLICATIONS   Prolonged healing time, if improperly treated or re-injured.  Recurrent symptoms that result in a chronic problem.  Recurrence of symptoms if activity is resumed too soon. TREATMENT  Treatment initially involves the use of ice and medication to help reduce pain and inflammation. The use of strengthening and stretching exercises may help reduce pain with activity. These exercises may be performed at home or with referral to a therapist. Crutches may be recommended to allow the muscle to rest until walking can be completed without limping. Surgery is rarely necessary for this injury, but may be considered if the injury involves a grade 3 strain, or if symptoms persist for greater than 3 months despite non-surgical (conservative) treatment.  MEDICATION  If pain medication is necessary, then nonsteroidal anti-inflammatory medications, such as aspirin and ibuprofen, or other minor pain relievers, such as acetaminophen, are often recommended.  Do not take pain medication for 7 days before surgery.  Prescription pain relievers may be given if deemed necessary by your caregiver. Use only as directed and only  as much as you need.  Ointments applied to the skin may be helpful.  Corticosteroid injections may be given by your caregiver. These injections should be reserved for the most serious cases, because they may only be given a certain number of times. HEAT AND COLD  Cold treatment (icing) relieves pain and reduces inflammation. Cold treatment should be applied for 10 to 15 minutes every 2 to 3 hours for inflammation and pain and immediately after any activity that aggravates your symptoms. Use ice packs or massage the area with a piece of ice (ice massage).  Heat treatment may be used prior to performing the stretching and strengthening activities prescribed by your caregiver, physical therapist, or athletic trainer. Use a heat pack or soak the injury in warm water. SEEK MEDICAL CARE IF:  Treatment seems to offer no benefit, or the condition worsens.  Any medications produce adverse side effects. EXERCISES  RANGE OF MOTION (ROM) AND STRETCHING EXERCISES - Quadriceps Strain These exercises may help you when beginning to rehabilitate your injury. Your symptoms may resolve with or without further involvement from your physician, physical therapist or athletic trainer. While completing these exercises, remember:   Restoring tissue flexibility helps normal motion to return to the joints. This allows healthier, less painful movement and activity.  An effective stretch should be held for at least 30 seconds.  A stretch should never be painful. You should only feel a gentle lengthening or release in the stretched tissue. RANGE  OF MOTION - Knee Flexion, Active  Lie on your back with both knees straight. (If this causes back discomfort, bend your opposite knee, placing your foot flat on the floor.)  Slowly slide your heel back toward your buttocks until you feel a gentle stretch in the front of your knee or thigh.  Hold for __________ seconds. Slowly slide your heel back to the starting  position. Repeat __________ times. Complete this exercise __________ times per day.  STRETCH - Quadriceps, Prone  Lie on your stomach on a firm surface, such as a bed or padded floor.  Bend your right / left knee and grasp your ankle. If you are unable to reach, your ankle or pant leg, use a belt around your foot to lengthen your reach.  Gently pull your heel toward your buttocks. Your knee should not slide out to the side. You should feel a stretch in the front of your thigh and/or knee.  Hold this position for __________ seconds. Repeat __________ times. Complete this stretch __________ times per day.  STRETCHING - Hip Flexors, Lunge  Half kneel with your right / left knee on the floor and your opposite knee bent and directly over your ankle.  Keep good posture with your head over your shoulders. Tighten your buttocks to point your tailbone downward; this will prevent your back from arching too much.  You should feel a gentle stretch in the front of your thigh and/or hip. If you do not feel any resistance, slightly slide your opposite foot forward and then slowly lunge forward so your knee once again lines up over your ankle. Be sure your tailbone remains pointed downward.  Hold this stretch for __________ seconds. Repeat __________ times. Complete this stretch __________ times per day. STRENGTHENING EXERCISES - Quadriceps Strain These exercises may help you when beginning to rehabilitate your injury. They may resolve your symptoms with or without further involvement from your physician, physical therapist or athletic trainer. While completing these exercises, remember:   Muscles can gain both the endurance and the strength needed for everyday activities through controlled exercises.  Complete these exercises as instructed by your physician, physical therapist or athletic trainer. Progress the resistance and repetitions only as guided. STRENGTH - Quadriceps, Isometrics  Lie on your  back with your right / left leg extended and your opposite knee bent.  Gradually tense the muscles in the front of your right / left thigh. You should see either your knee cap slide up toward your hip or increased dimpling just above the knee. This motion will push the back of the knee down toward the floor/mat/bed on which you are lying.  Hold the muscle as tight as you can without increasing your pain for __________ seconds.  Relax the muscles slowly and completely in between each repetition. Repeat __________ times. Complete this exercise __________ times per day.  STRENGTH - Quadriceps, Short Arcs   Lie on your back. Place a __________ inch towel roll under your knee so that the knee slightly bends.  Raise only your lower leg by tightening the muscles in the front of your thigh. Do not allow your thigh to rise.  Hold this position for __________ seconds. Repeat __________ times. Complete this exercise __________ times per day.  OPTIONAL ANKLE WEIGHTS: Begin with ____________________, but DO NOT exceed ____________________. Increase in1 lb/0.5 kg increments. STRENGTH - Quadriceps, Straight Leg Raises  Quality counts! Watch for signs that the quadriceps muscle is working to insure you are strengthening the correct muscles  and not "cheating" by substituting with healthier muscles.  Lay on your back with your right / left leg extended and your opposite knee bent.  Tense the muscles in the front of your right / left thigh. You should see either your knee cap slide up or increased dimpling just above the knee. Your thigh may even quiver.  Tighten these muscles even more and raise your leg 4 to 6 inches off the floor. Hold for __________ seconds.  Keeping these muscles tense, lower your leg.  Relax the muscles slowly and completely in between each repetition. Repeat __________ times. Complete this exercise __________ times per day.  STRENGTH - Quadriceps, Wall Slides  Follow guidelines  for form closely. Increased knee pain often results from poorly placed feet or knees.  Lean against a smooth wall or door and walk your feet out 18-24 inches. Place your feet hip-width apart.  Slowly slide down the wall or door until your knees bend __________ degrees.* Keep your knees over your heels, not your toes, and in line with your hips, not falling to either side.  Hold for __________ seconds. Stand up to rest for __________ seconds in between each repetition. Repeat __________ times. Complete this exercise __________ times per day. * Your physician, physical therapist or athletic trainer will alter this angle based on your symptoms and progress. STRENGTH - Quadriceps, Step-Ups   Use a thick book, step or step stool that is __________ inches tall.  Holding a wall or counter for balance only, not support.  Slowly step-up with your right / left foot, keeping your knee in line with your hip and foot. Do not allow your knee to bend so far that you cannot see your toes.  Slowly unlock your knee and lower yourself to the starting position. Your muscles, not gravity, should lower you. Repeat __________ times. Complete this exercise __________ times per day. Document Released: 09/21/2005 Document Revised: 12/14/2011 Document Reviewed: 01/03/2009 [facility] Patient Information 2015 [place], [place]. This information is not intended to replace advice given to you by your health care provider. Make sure you discuss any questions you have with your health care provider.

## 2015-06-18 NOTE — Progress Notes (Signed)
Patient ID: Jeffrey [name], male   DOB: 12/18/1975, 39 y.o.   MRN: [id number]  Patient Name: Jeffrey [name] Date of Encounter: 06/18/2015  Primary Care Provider:  Angelica Chessman, MD Primary Cardiologist:  Lizbeth Bark, MD  Primary HF Cardiologist: Bensimhon  History:  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   Echo in 2012 with EF 20-25%.  In 1/13 had nuclear study EF 45-50% suggestive of NICM.    Admitted to [facility] in 3/16 secondary to atypical chest pain, dyspnea, and mild troponin elevation. Echo during hospitalization shows "severely reduced LV function" - EF not quantified due to poor windows. He was diuresed and was down about 7 pounds at discharge, at 289 on the hospital scale.   Admitted 05/14/15 with CP at rest. CXR looked improved from previous. CP resolved spontaneously after hospitalization and troponin was flat. R/LHC showed normal coronaries and moderately elevated filling pressures with moderately depressed cardiac output. He had been previously seen by EP and approved for ICD with chronic low EF. [place] subcutaneous ICD was implanted 05/17/15.  He was diuresed with IV lasix 40 mg. Discharge weight 290 lb.  He returns today for post hospital follow up. Weight is stable from discharge. Overall has been feeling ok, but had bad leg swelling last Friday.  Was evaluated at [facility] over the weekend and found to have a significant groin pull. No CP, SOB, dizziness, lightheadedness.  Watching salt and fluid intake. Taking all medications as directed.   Still working at [employer] of work currently with health problems but will go back when able. Denies any ICD shocks.  Echo 05/07/15.  EF 20-25%, grade II diastolic dysfunction, diffuse hypokinesis, moderate LV dilation, mild LVH, normal RV size  with mildly decreased systolic function.    [facility] 05/17/15 RA = 7 RV = 42/3/7 PA = 43/30 (39) PCW = 26 Fick cardiac output/index = 4.9/2.1 PVR = 2.7 WU SVR = 1651 FA sat = 95% PA sat = 65%, 65% Ao = 122/97 (109) LV = 110/31/33 1) Normal coronaries 2) Moderately elevated filling pressures with moderately depressed cardiac output  Labs (6/16): K 4.1, creatinine 1.52, BNP 133 Labs (06/16/15): K 3.9, creatinine 1.9  Past Medical History   Past Medical History  Diagnosis Date  . Hypertension   . Morbid obesity   . Presumed NICM     a. 04/2014 Myoview: EF 26%, glob HK, sev glob HK, ? prior infarct;  b. Never cathed 2/2 CKD.  [name] Chronic systolic CHF (congestive heart failure)     a. EF 20-25% in 2012. b. EF 45-50% in 10/2011 with nonischemic nuc - presumed NICM. c. 12/2014 Echo: Sev depressed LV fxn, sev dil LV, mild LVH, mild MR, sev dil LA, mildly reduced RV fxn.  . Nephrolithiasis   . OSA on CPAP   . High cholesterol   . Childhood asthma   . Type II diabetes mellitus   . Anxiety   . Renal cell carcinoma     a. s/p Rt robotic assisted partial converted to radical nephrectomy on 01/2013.  . Troponin level elevated     a. 04/2014, 12/2014: felt due to CHF.  . CKD (chronic kidney disease) stage 2, GFR 60-89 ml/min   . Aspirin allergy    Past Surgical History  Procedure Laterality Date  .  Robotic assited partial nephrectomy Right 01/13/2013    Procedure: ROBOTIC ASSITED PARTIAL NEPHRECTOMY CONVERTED TO ROBOTIC ASSISTED RIGHT RADICAL NEPHRECTOMY;  Surgeon: Alexis Frock, MD;  Location: [facility];  Service: Urology;  Laterality: Right;  . Robotic assisted laparoscopic lysis of adhesion  01/13/2013    Procedure: ROBOTIC ASSISTED LAPAROSCOPIC LYSIS OF ADHESION EXTENSIVE;  Surgeon: Alexis Frock, MD;  Location: [facility];  Service: Urology;;  . Appendectomy  07/2004  . Cardiac catheterization N/A 05/17/2015    Procedure: Right/Left Heart Cath and Coronary Angiography;  Surgeon: Jolaine Artist, MD;  Location: [place] [facility];  Service: Cardiovascular;  Laterality: N/A;  . Ep implantable device N/A 05/17/2015    Procedure: SubQ ICD Implant;  Surgeon: Will Meredith Leeds, MD;  Location: [place] [facility];  Service: Cardiovascular;  Laterality: N/A;    Allergies  Allergies  Allergen Reactions  . Aspirin Shortness Of Breath, Itching and Rash     Burning sensation (Patient reports he tolerates other NSAIDS)   . Bee Venom Hives and Swelling  . Tomato Rash     Home Medications Current Outpatient Prescriptions  Medication Sig Dispense Refill  . allopurinol (ZYLOPRIM) 100 MG tablet Take 1 tablet (100 mg total) by mouth daily. 30 tablet 2  . atorvastatin (LIPITOR) 80 MG tablet TAKE 1 TABLET (80 MG TOTAL) BY MOUTH EVERY EVENING. 30 tablet 6  . carvedilol (COREG) 6.25 MG tablet Take 3 tablets (18.75 mg total) by mouth 2 (two) times daily. 180 tablet 4  . hydrALAZINE (APRESOLINE) 25 MG tablet Take 1 tablet (25 mg total) by mouth 3 (three) times daily. 90 tablet 3  . insulin NPH-regular Human (NOVOLIN 70/30) (70-30) 100 UNIT/ML injection Inject 34 Units into the skin 2 (two) times daily with a meal. 4 vial 2  . isosorbide mononitrate (IMDUR) 30 MG 24 hr tablet Take 1 tablet (30 mg total) by mouth daily. 30 tablet 11  . lisinopril (PRINIVIL,ZESTRIL) 10 MG tablet Take 1 tablet (10 mg total) by mouth daily. 30 tablet 6  . torsemide (DEMADEX) 20 MG tablet Take 0.5 tablets (10 mg total) by mouth daily. 60 tablet 3  . HYDROcodone-acetaminophen (NORCO/VICODIN) 5-325 MG per tablet Take 1 tablet by mouth every 6 (six) hours as needed for moderate pain. (Patient not taking: Reported on 06/18/2015) 30 tablet 0  . ibuprofen (ADVIL,MOTRIN) 600 MG tablet Take 1 tablet (600 mg total) by mouth every 6 (six) hours as needed. (Patient not taking: Reported on 06/18/2015) 30 tablet 0  . promethazine (PHENERGAN) 25 MG tablet Take 1 tablet (25 mg total) by mouth every 6 (six) hours as needed for  nausea or vomiting. (Patient not taking: Reported on 06/18/2015) 30 tablet 3  . spironolactone (ALDACTONE) 25 MG tablet Take 1 tablet (25 mg total) by mouth daily. 30 tablet 6  . traMADol (ULTRAM) 50 MG tablet Take 1 tablet (50 mg total) by mouth every 6 (six) hours as needed. (Patient not taking: Reported on 06/18/2015) 45 tablet 0  . [DISCONTINUED] cetirizine (ZYRTEC ALLERGY) 10 MG tablet Take 1 tablet (10 mg total) by mouth daily. 30 tablet 1   No current facility-administered medications for this encounter.      Review of Systems  As above, he has been feeling well without dyspnea on exertion, PND, edema, or early satiety.  He does have chronic orthopnea. He reports compliance with medications.  All other systems reviewed and are otherwise negative except as noted above.  Physical Exam  VS:  BP 118/88 mmHg  Pulse 88  Wt 289 lb 1.9 oz (131.144 kg)  SpO2 96% , Body mass index is 45.27 kg/(m^2). GEN: Well nourished, well developed, in no acute distress. HEENT: normal. Neck: Supple, no JVD noted, carotid bruits, or masses. Cardiac: RRR, no murmurs, rubs, or gallops. No clubbing, cyanosis, or edema.  Radials/DP/PT 2+ and equal bilaterally.  Respiratory: CTA GI: Obese, soft, nontender, nondistended, BS + x 4. MS: no deformity or atrophy. Full Leg brace R leg s/p groin pull. Skin: warm and dry, no rash. Neuro:  Strength and sensation are intact. Psych: Normal affect.  Assessment & Plan  1.  Chronic systolic congestive heart failure: s/p [employer] subcutaneous ICD implant 05/17/15.  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 Echo 05/07/15 with 20-25% EF.  CMP may be due to HTN.  NYHA class II symptoms, no volume overload.   - Continue lisinopril to 10 mg daily - Continue Coreg 18.75 mg bid. - Continue hydralazine to 25 mg TID and continue Imdur 30 mg daily.  - Increase spironolactone to 25 mg daily with BMET in 10 days. - Continue Torsemide 20 mg daily - Has 3 month EP follow up  in December. 2. Hypertension: Stable today. 3. CKD stage III: Follow creatinine closely.  4. OSA: Using CPAP from time to time. 5. Tobacco use: No smoking for > 1 month 6. Gout: Continue allopurinol 200 daily for prophylaxis.  7. DM2: Out of insulin. Will refill today, but will need PCP to fill in the future. 8. Groin pull - Sees ortho 9/14  BMET x 10 days. Follow up 2 months.   Shirley Friar, PA-C 3:32 PM   Patient seen with PA, agree with the above note.  Stable from cardiac perspective.  Increase spironolactone to 25 mg daily with BMET in 10 days.  When his leg is better (groin pull), will arrange for CPX.    Loralie Champagne 06/20/2015

## 2015-06-18 NOTE — Patient Instructions (Signed)
Spironolactone 25 mg daily  Labs in 10 days  Your physician recommends that you schedule a follow-up appointment in: 2 months

## 2015-06-20 DIAGNOSIS — N183 Chronic kidney disease, stage 3 unspecified: Secondary | ICD-10-CM | POA: Insufficient documentation

## 2015-06-26 ENCOUNTER — Other Ambulatory Visit (HOSPITAL_COMMUNITY): Payer: Self-pay

## 2015-06-27 ENCOUNTER — Encounter: Payer: Self-pay | Admitting: Internal Medicine

## 2015-06-28 ENCOUNTER — Telehealth (HOSPITAL_COMMUNITY): Payer: Self-pay | Admitting: *Deleted

## 2015-06-28 ENCOUNTER — Ambulatory Visit (HOSPITAL_COMMUNITY)
Admission: RE | Admit: 2015-06-28 | Discharge: 2015-06-28 | Disposition: A | Discharge status: Home / Self Care | Payer: Self-pay | Source: Ambulatory Visit | Attending: Cardiology | Admitting: Cardiology

## 2015-06-28 DIAGNOSIS — I5022 Chronic systolic (congestive) heart failure: Secondary | ICD-10-CM

## 2015-06-28 DIAGNOSIS — I5042 Chronic combined systolic (congestive) and diastolic (congestive) heart failure: Secondary | ICD-10-CM | POA: Insufficient documentation

## 2015-06-28 LAB — BASIC METABOLIC PANEL
Anion gap: 6 (ref 5–15)
BUN: 18 mg/dL (ref 6–20)
CO2: 25 mmol/L (ref 22–32)
CREATININE: 1.59 mg/dL — AB (ref 0.61–1.24)
Calcium: 9.2 mg/dL (ref 8.9–10.3)
Chloride: 109 mmol/L (ref 101–111)
GFR, EST NON AFRICAN AMERICAN: 53 mL/min — AB (ref >60)
Glucose, Bld: 106 mg/dL — ABNORMAL HIGH (ref 65–99)
POTASSIUM: 4.1 mmol/L (ref 3.5–5.1)
SODIUM: 140 mmol/L (ref 135–145)

## 2015-06-28 NOTE — Telephone Encounter (Signed)
Records faxed to disability.

## 2015-08-13 ENCOUNTER — Ambulatory Visit (HOSPITAL_COMMUNITY)
Admission: RE | Admit: 2015-08-13 | Discharge: 2015-08-13 | Disposition: A | Discharge status: Home / Self Care | Payer: Self-pay | Source: Ambulatory Visit | Attending: Internal Medicine | Admitting: Internal Medicine

## 2015-08-13 VITALS — BP 140/94 | HR 72 | Wt 294.2 lb

## 2015-08-13 DIAGNOSIS — N183 Chronic kidney disease, stage 3 (moderate): Secondary | ICD-10-CM | POA: Insufficient documentation

## 2015-08-13 DIAGNOSIS — Z720 Tobacco use: Secondary | ICD-10-CM | POA: Insufficient documentation

## 2015-08-13 DIAGNOSIS — I130 Hypertensive heart and chronic kidney disease with heart failure and stage 1 through stage 4 chronic kidney disease, or unspecified chronic kidney disease: Secondary | ICD-10-CM | POA: Insufficient documentation

## 2015-08-13 DIAGNOSIS — M109 Gout, unspecified: Secondary | ICD-10-CM | POA: Insufficient documentation

## 2015-08-13 DIAGNOSIS — M25561 Pain in right knee: Secondary | ICD-10-CM | POA: Insufficient documentation

## 2015-08-13 DIAGNOSIS — I5022 Chronic systolic (congestive) heart failure: Secondary | ICD-10-CM

## 2015-08-13 DIAGNOSIS — G4733 Obstructive sleep apnea (adult) (pediatric): Secondary | ICD-10-CM

## 2015-08-13 DIAGNOSIS — Z794 Long term (current) use of insulin: Secondary | ICD-10-CM | POA: Insufficient documentation

## 2015-08-13 DIAGNOSIS — E119 Type 2 diabetes mellitus without complications: Secondary | ICD-10-CM | POA: Insufficient documentation

## 2015-08-13 DIAGNOSIS — I10 Essential (primary) hypertension: Secondary | ICD-10-CM

## 2015-08-13 MED ORDER — LOSARTAN POTASSIUM 25 MG PO TABS: 25.0000 mg | ORAL_TABLET | Freq: Every day | ORAL | Status: DC

## 2015-08-13 MED ORDER — TORSEMIDE 20 MG PO TABS: 20.0000 mg | ORAL_TABLET | Freq: Every day | ORAL | Status: DC

## 2015-08-13 NOTE — Patient Instructions (Signed)
Medications:  Take Torsemide 40 mg (2 tablets) daily for 2 days then 20 mg (1 tablet) daily  Stop Lisinopril   Start Losartan 25 mg daily  Follow up in 2 weeks with Amy

## 2015-08-13 NOTE — Progress Notes (Signed)
Advanced Heart Failure Medication Review by a Pharmacist  Does the patient  feel that his/her medications are working for him/her?  yes  Has the patient been experiencing any side effects to the medications prescribed?  no  Does the patient measure his/her own blood pressure or blood glucose at home?  no   Does the patient have any problems obtaining medications due to transportation or finances?   no  Understanding of regimen: good Understanding of indications: good Potential of compliance: good Patient understands to avoid NSAIDs. Patient understands to avoid decongestants.  Issues to address at subsequent visits: None   Pharmacist comments:  Jeffrey [name] is a pleasant 39 yo M presenting without a medication list but with an excellent understanding of his regimen including dosages. He reports good compliance with his medications and did not have any specific medication-related questions or concerns for me at this time.   Ruta Hinds. Velva Harman, PharmD, BCPS, CPP Clinical Pharmacist Pager: [phone] Phone: [phone] 08/13/2015 11:43 AM      Time with patient: 4 minutes Preparation and documentation time: 2 minutes Total time: 6 minutes

## 2015-08-13 NOTE — Progress Notes (Signed)
Patient ID: [name], male   DOB: 1976-03-26, 39 y.o.   MRN: [id number]  Patient Name: EBONY RICKEL Date of Encounter: 08/13/2015  Primary Care Provider:  Angelica Chessman, MD Primary Cardiologist:  Lizbeth Bark, MD  Primary HF Cardiologist: Bensimhon  History: Ms. 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   Echo in 2012 with EF 20-25%.  In 1/13 had nuclear study EF 45-50% suggestive of NICM.    Admitted to [facility] in 3/16 secondary to atypical chest pain, dyspnea, and mild troponin elevation. Echo during hospitalization shows "severely reduced LV function" - EF not quantified due to poor windows. He was diuresed and was down about 7 pounds at discharge, at 289 on the hospital scale.   Admitted 05/14/15 with CP at rest. CXR looked improved from previous. CP resolved spontaneously after hospitalization and troponin was flat. R/LHC showed normal coronaries and moderately elevated filling pressures with moderately depressed cardiac output. He had been previously seen by EP and approved for ICD with chronic low EF. [place] subcutaneous ICD was implanted 05/17/15.  He was diuresed with IV lasix 40 mg. Discharge weight 290 lb.  He returns today for follow up. Last visit spiro increased to 25 mg daily. Overall feeling bad due to a cough. Cough worse over the last week. Has been taking tussin. Productive cough with yellow sputum.  Says his children had a virus. Denies fever. Mild dyspnea. Weight at home 294 pounds. Limited activity due to R knee pain. He has been seen by orthopedic surgeon and needs knee replacement.  Taking all medications as directed but didn't take meds today. Eating deli meats and campbells soup for the last 7 days. Denies any ICD shocks. Does not smoke. Smokes marijuana.   Echo 05/07/15.  EF  20-25%, grade II diastolic dysfunction, diffuse hypokinesis, moderate LV dilation, mild LVH, normal RV size with mildly decreased systolic function.    [facility] 05/17/15 RA = 7 RV = 42/3/7 PA = 43/30 (39) PCW = 26 Fick cardiac output/index = 4.9/2.1 PVR = 2.7 WU SVR = 1651 FA sat = 95% PA sat = 65%, 65% Ao = 122/97 (109) LV = 110/31/33 1) Normal coronaries 2) Moderately elevated filling pressures with moderately depressed cardiac output  Labs (6/16): K 4.1, creatinine 1.52, BNP 133 Labs (06/16/15): K 3.9, creatinine 1.9 Labs (06/28/2015) K 4.1 Creatinine 1.59   Past Medical History   Past Medical History  Diagnosis Date  . Hypertension   . Morbid obesity   . Presumed NICM     a. 04/2014 Myoview: EF 26%, glob HK, sev glob HK, ? prior infarct;  b. Never cathed 2/2 CKD.  [name] Chronic systolic CHF (congestive heart failure)     a. EF 20-25% in 2012. b. EF 45-50% in 10/2011 with nonischemic nuc - presumed NICM. c. 12/2014 Echo: Sev depressed LV fxn, sev dil LV, mild LVH, mild MR, sev dil LA, mildly reduced RV fxn.  . Nephrolithiasis   . OSA on CPAP   . High cholesterol   . Childhood asthma   . Type II diabetes mellitus   . Anxiety   . Renal cell carcinoma     a. s/p Rt robotic assisted partial converted to radical nephrectomy on 01/2013.  . Troponin level elevated     a. 04/2014, 12/2014: felt due to  CHF.  . CKD (chronic kidney disease) stage 2, GFR 60-89 ml/min   . Aspirin allergy    Past Surgical History  Procedure Laterality Date  . Robotic assited partial nephrectomy Right 01/13/2013    Procedure: ROBOTIC ASSITED PARTIAL NEPHRECTOMY CONVERTED TO ROBOTIC ASSISTED RIGHT RADICAL NEPHRECTOMY;  Surgeon: Alexis Frock, MD;  Location: [facility];  Service: Urology;  Laterality: Right;  . Robotic assisted laparoscopic lysis of adhesion  01/13/2013    Procedure: ROBOTIC ASSISTED LAPAROSCOPIC LYSIS OF ADHESION EXTENSIVE;  Surgeon: Alexis Frock, MD;  Location: [facility];  Service: Urology;;  .  Appendectomy  07/2004  . Cardiac catheterization N/A 05/17/2015    Procedure: Right/Left Heart Cath and Coronary Angiography;  Surgeon: Jolaine Artist, MD;  Location: [name] [facility];  Service: Cardiovascular;  Laterality: N/A;  . Ep implantable device N/A 05/17/2015    Procedure: SubQ ICD Implant;  Surgeon: Will Meredith Leeds, MD;  Location: [place] [facility];  Service: Cardiovascular;  Laterality: N/A;    Allergies  Allergies  Allergen Reactions  . Aspirin Shortness Of Breath, Itching and Rash     Burning sensation (Patient reports he tolerates other NSAIDS)   . Bee Venom Hives and Swelling  . Tomato Rash     Home Medications Current Outpatient Prescriptions  Medication Sig Dispense Refill  . allopurinol (ZYLOPRIM) 100 MG tablet Take 1 tablet (100 mg total) by mouth daily. 30 tablet 2  . atorvastatin (LIPITOR) 80 MG tablet TAKE 1 TABLET (80 MG TOTAL) BY MOUTH EVERY EVENING. 30 tablet 6  . carvedilol (COREG) 6.25 MG tablet Take 3 tablets (18.75 mg total) by mouth 2 (two) times daily. 180 tablet 4  . guaifenesin (TUSSIN) 100 MG/5ML syrup Take 200 mg by mouth 3 (three) times daily as needed for cough.    . hydrALAZINE (APRESOLINE) 25 MG tablet Take 1 tablet (25 mg total) by mouth 3 (three) times daily. 90 tablet 3  . insulin NPH-regular Human (NOVOLIN 70/30) (70-30) 100 UNIT/ML injection Inject 34 Units into the skin 2 (two) times daily with a meal. 4 vial 2  . isosorbide mononitrate (IMDUR) 30 MG 24 hr tablet Take 1 tablet (30 mg total) by mouth daily. 30 tablet 11  . lisinopril (PRINIVIL,ZESTRIL) 10 MG tablet Take 1 tablet (10 mg total) by mouth daily. 30 tablet 6  . spironolactone (ALDACTONE) 25 MG tablet Take 1 tablet (25 mg total) by mouth daily. 30 tablet 6  . torsemide (DEMADEX) 20 MG tablet Take 0.5 tablets (10 mg total) by mouth daily. 60 tablet 3  . [DISCONTINUED] cetirizine (ZYRTEC ALLERGY) 10 MG tablet Take 1 tablet (10 mg total) by mouth daily. 30 tablet 1   No  current facility-administered medications for this encounter.      Review of Systems  As above, he has been feeling well without dyspnea on exertion, PND, edema, or early satiety.  He does have chronic orthopnea. He reports compliance with medications.  All other systems reviewed and are otherwise negative except as noted above.  Physical Exam  VS:  BP 140/94 mmHg  Pulse 72  Wt 294 lb 3.2 oz (133.448 kg)  SpO2 99% , Body mass index is 46.07 kg/(m^2).   GEN: Well nourished, well developed, in no acute distress. HEENT: normal. Neck: Supple, JVP ~10, carotid bruits, or masses. Cardiac: RRR, no murmurs, rubs, or gallops. No clubbing, cyanosis, or edema.  Radials/DP/PT 2+ and equal bilaterally.  Respiratory: Clear  GI: Obese, soft, nontender, nondistended, BS + x 4. MS:  no deformity or atrophy. R and LLE 1+ edema.  Skin: warm and dry, no rash. Neuro:  Strength and sensation are intact. Psych: Normal affect.  Assessment & Plan  1.  Chronic systolic congestive heart failure: s/p [employer] subcutaneous ICD implant 05/17/15.  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 Echo 05/07/15 with 20-25% EF.  CMP may be due to HTN.   NYHA class II-III symptoms. Volume overloaded today likely due to high salt diet. I have asked him to eliminate campbell soup.  - Increase torsemide 40 mg daily for 2 days then back to torsemide 20 mg daily.  - Stop lisinopril due to cough.  Start losartan 25 mg daily. Likely use entresto in the future.  - Continue Coreg 18.75 mg bid will not increase with fatigue. . - Continue hydralazine to 25 mg TID and continue Imdur 30 mg daily.  -Continue spironolactone to 25 mg daily  - Has 3 month EP follow up in December. 2. Hypertension: Elevated but didn't have meds today. 3. CKD stage III: Follow creatinine closely. Check next visit.  4. OSA: Using CPAP from time to time. 5. Tobacco use: quit 1 month ago.  6. Gout: Continue allopurinol 200 daily for prophylaxis.  7.  DM2: Out of insulin. Has follow up at [facility].  8. R knee pain. Followed by ortho.   Follow up in 2 weeks. Will need BMET at that time.   Edder Bellanca Ninfa Meeker, NP-C  11:51 AM

## 2015-08-27 ENCOUNTER — Encounter (HOSPITAL_COMMUNITY): Payer: Self-pay

## 2015-09-09 NOTE — Progress Notes (Signed)
Patient ID: [name], male   DOB: Jun 28, 1976, 39 y.o.   MRN: [id number]  Patient Name: MONTRAVIOUS DESPAIN Date of Encounter: 09/10/2015  Primary Care Provider:  Angelica Chessman, MD Primary Cardiologist:  Lizbeth Bark, MD  Primary HF Cardiologist: Bensimhon  History: Ms. 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    Admitted to [facility] in 3/16 secondary to atypical chest pain, dyspnea, and mild troponin elevation. Echo during hospitalization shows "severely reduced LV function" - EF not quantified due to poor windows. He was diuresed and was down about 7 pounds at discharge, at 289 on the hospital scale.   Admitted 05/14/15 with CP at rest. CXR looked improved from previous. CP resolved spontaneously after hospitalization and troponin was flat. R/LHC showed normal coronaries and moderately elevated filling pressures with moderately depressed cardiac output. He had been previously seen by EP and approved for ICD with chronic low EF. The [place] subcutaneous ICD was implanted 05/17/15.  He was diuresed with IV lasix 40 mg. Discharge weight 290 lb.  He returns today for follow up. Last visit lisinopril stopped due to cough and losartan started. Cough has resolved.  Also had volume overload so torsemide was increased for 2 days. Having difficulty coping with  illness. Wants to go back to work.  Denies SOB/PND/Orthopnea. Weight at home 290-295 pounds.  Denies any ICD shocks. Does not smoke cigarettes. Smokes marijuana. Has not had medications today.    Echo 05/07/15.  EF 20-25%, grade II diastolic dysfunction, diffuse hypokinesis, moderate LV dilation, mild LVH, normal RV size with mildly decreased systolic function.   Echo in 2012 with EF 20-25%.  In 1/13 had nuclear study EF 45-50% suggestive of NICM.   [facility] 05/17/15 RA = 7 RV =  42/3/7 PA = 43/30 (39) PCW = 26 Fick cardiac output/index = 4.9/2.1 PVR = 2.7 WU SVR = 1651 FA sat = 95% PA sat = 65%, 65% Ao = 122/97 (109) LV = 110/31/33 1) Normal coronaries 2) Moderately elevated filling pressures with moderately depressed cardiac output  Labs (6/16): K 4.1, creatinine 1.52, BNP 133 Labs (06/16/15): K 3.9, creatinine 1.9 Labs (06/28/2015) K 4.1 Creatinine 1.59   Past Medical History   Past Medical History  Diagnosis Date  . Hypertension   . Morbid obesity   . Presumed NICM     a. 04/2014 Myoview: EF 26%, glob HK, sev glob HK, ? prior infarct;  b. Never cathed 2/2 CKD.  [name] Chronic systolic CHF (congestive heart failure)     a. EF 20-25% in 2012. b. EF 45-50% in 10/2011 with nonischemic nuc - presumed NICM. c. 12/2014 Echo: Sev depressed LV fxn, sev dil LV, mild LVH, mild MR, sev dil LA, mildly reduced RV fxn.  . Nephrolithiasis   . OSA on CPAP   . High cholesterol   . Childhood asthma   . Type II diabetes mellitus   . Anxiety   . Renal cell carcinoma     a. s/p Rt robotic assisted partial converted to radical nephrectomy on 01/2013.  . Troponin level elevated     a. 04/2014, 12/2014: felt due to CHF.  . CKD (chronic kidney disease) stage 2, GFR 60-89 ml/min   . Aspirin allergy    Past Surgical History  Procedure Laterality Date  . Robotic assited partial nephrectomy Right 01/13/2013  Procedure: ROBOTIC ASSITED PARTIAL NEPHRECTOMY CONVERTED TO ROBOTIC ASSISTED RIGHT RADICAL NEPHRECTOMY;  Surgeon: Alexis Frock, MD;  Location: [facility];  Service: Urology;  Laterality: Right;  . Robotic assisted laparoscopic lysis of adhesion  01/13/2013    Procedure: ROBOTIC ASSISTED LAPAROSCOPIC LYSIS OF ADHESION EXTENSIVE;  Surgeon: Alexis Frock, MD;  Location: [facility];  Service: Urology;;  . Appendectomy  07/2004  . Cardiac catheterization N/A 05/17/2015    Procedure: Right/Left Heart Cath and Coronary Angiography;  Surgeon: Jolaine Artist, MD;  Location: [place] [facility];  Service: Cardiovascular;  Laterality: N/A;  . Ep implantable device N/A 05/17/2015    Procedure: SubQ ICD Implant;  Surgeon: Will Meredith Leeds, MD;  Location: [place] [facility];  Service: Cardiovascular;  Laterality: N/A;    Allergies  Allergies  Allergen Reactions  . Aspirin Shortness Of Breath, Itching and Rash     Burning sensation (Patient reports he tolerates other NSAIDS)   . Bee Venom Hives and Swelling  . Tomato Rash     Home Medications Current Outpatient Prescriptions  Medication Sig Dispense Refill  . allopurinol (ZYLOPRIM) 100 MG tablet Take 1 tablet (100 mg total) by mouth daily. 30 tablet 2  . atorvastatin (LIPITOR) 80 MG tablet TAKE 1 TABLET (80 MG TOTAL) BY MOUTH EVERY EVENING. 30 tablet 6  . carvedilol (COREG) 6.25 MG tablet Take 3 tablets (18.75 mg total) by mouth 2 (two) times daily. 180 tablet 4  . hydrALAZINE (APRESOLINE) 25 MG tablet Take 1 tablet (25 mg total) by mouth 3 (three) times daily. 90 tablet 3  . insulin NPH-regular Human (NOVOLIN 70/30) (70-30) 100 UNIT/ML injection Inject 34 Units into the skin 2 (two) times daily with a meal. 4 vial 2  . isosorbide mononitrate (IMDUR) 30 MG 24 hr tablet Take 1 tablet (30 mg total) by mouth daily. 30 tablet 11  . losartan (COZAAR) 25 MG tablet Take 1 tablet (25 mg total) by mouth daily. 30 tablet 6  . spironolactone (ALDACTONE) 25 MG tablet Take 1 tablet (25 mg total) by mouth daily. 30 tablet 6  . torsemide (DEMADEX) 20 MG tablet Take 1 tablet (20 mg total) by mouth daily. 60 tablet 3  . [DISCONTINUED] cetirizine (ZYRTEC ALLERGY) 10 MG tablet Take 1 tablet (10 mg total) by mouth daily. 30 tablet 1   No current facility-administered medications for this encounter.      Review of Systems  As above, he has been feeling well without dyspnea on exertion, PND, edema, or early satiety.  He does have chronic orthopnea. He reports compliance with medications.  All other systems reviewed and are otherwise  negative except as noted above.  Physical Exam  VS:  BP 132/100 mmHg  Pulse 102  Wt 299 lb 9.6 oz (135.898 kg)  SpO2 95% , Body mass index is 46.91 kg/(m^2).   GEN: Well nourished, well developed, in no acute distress. HEENT: normal. Neck: Supple, JVP 5-6  carotid bruits, or masses. Cardiac: RRR, no murmurs, rubs, or gallops. No clubbing, cyanosis, or edema.  Radials/DP/PT 2+ and equal bilaterally.  Respiratory: Clear  GI: Obese, soft, nontender, nondistended, BS + x 4. MS: no deformity or atrophy. R and LLE no edema.   Skin: warm and dry, no rash. Neuro:  Strength and sensation are intact. Psych: Normal affect.  Assessment & Plan  1.  Chronic systolic congestive heart failure: s/p [employer] subcutaneous ICD implant 05/17/15.  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 Echo 05/07/15 with 20-25% EF.  CMP  may be due to HTN.   NYHA class II-III symptoms. Volume status stable despite weight gain.  Continue torsemide 20 mg daily.  -Stop losartan  And start entresto 24-26 mg tiwce a day. Provided 30 day free card. [employer] application initiated.  - Continue Coreg 18.75 mg bid. Hopefully can increase next visit.   - Continue hydralazine to 25 mg TID and continue Imdur 30 mg daily.  -Continue spironolactone to 25 mg daily  - Has EP follow up this month.. - Provided with letter for work release.  2. Hypertension: Stop losartan as above and add entresto  3. CKD stage III: Follow creatinine closely. Check BMET today  4. OSA: Using CPAP from time to time. Encouraged to use daily.  5. Tobacco use: quit  6 weeks ago.   6. Gout: Continue allopurinol 200 daily for prophylaxis.  7. DM2: Back on insulin.  Has follow up at [place].  8. R knee pain. Followed by ortho.  9. ? Situational depression- Referred to HF SW for coping mechanisms.   Check BMET today.  Follow up 4 weeks.  Janiel Crisostomo, NP-C  11:00 AM

## 2015-09-10 ENCOUNTER — Encounter (HOSPITAL_COMMUNITY): Payer: Self-pay | Admitting: Cardiology

## 2015-09-10 ENCOUNTER — Ambulatory Visit (HOSPITAL_COMMUNITY)
Admission: RE | Admit: 2015-09-10 | Discharge: 2015-09-10 | Disposition: A | Discharge status: Home / Self Care | Payer: Self-pay | Source: Ambulatory Visit | Attending: Cardiology | Admitting: Cardiology

## 2015-09-10 ENCOUNTER — Ambulatory Visit: Payer: Self-pay | Admitting: Internal Medicine

## 2015-09-10 ENCOUNTER — Encounter: Payer: Self-pay | Admitting: Licensed Clinical Social Worker

## 2015-09-10 VITALS — BP 132/100 | HR 102 | Wt 299.6 lb

## 2015-09-10 DIAGNOSIS — E1122 Type 2 diabetes mellitus with diabetic chronic kidney disease: Secondary | ICD-10-CM | POA: Insufficient documentation

## 2015-09-10 DIAGNOSIS — I5023 Acute on chronic systolic (congestive) heart failure: Secondary | ICD-10-CM

## 2015-09-10 DIAGNOSIS — N183 Chronic kidney disease, stage 3 unspecified: Secondary | ICD-10-CM

## 2015-09-10 DIAGNOSIS — Z91030 Bee allergy status: Secondary | ICD-10-CM | POA: Insufficient documentation

## 2015-09-10 DIAGNOSIS — Z886 Allergy status to analgesic agent status: Secondary | ICD-10-CM | POA: Insufficient documentation

## 2015-09-10 DIAGNOSIS — I129 Hypertensive chronic kidney disease with stage 1 through stage 4 chronic kidney disease, or unspecified chronic kidney disease: Secondary | ICD-10-CM | POA: Insufficient documentation

## 2015-09-10 DIAGNOSIS — M109 Gout, unspecified: Secondary | ICD-10-CM | POA: Insufficient documentation

## 2015-09-10 DIAGNOSIS — E7800 Pure hypercholesterolemia, unspecified: Secondary | ICD-10-CM | POA: Insufficient documentation

## 2015-09-10 DIAGNOSIS — Z794 Long term (current) use of insulin: Secondary | ICD-10-CM | POA: Insufficient documentation

## 2015-09-10 DIAGNOSIS — I5022 Chronic systolic (congestive) heart failure: Secondary | ICD-10-CM

## 2015-09-10 DIAGNOSIS — F1290 Cannabis use, unspecified, uncomplicated: Secondary | ICD-10-CM | POA: Insufficient documentation

## 2015-09-10 DIAGNOSIS — Z87891 Personal history of nicotine dependence: Secondary | ICD-10-CM | POA: Insufficient documentation

## 2015-09-10 DIAGNOSIS — I429 Cardiomyopathy, unspecified: Secondary | ICD-10-CM | POA: Insufficient documentation

## 2015-09-10 DIAGNOSIS — G4733 Obstructive sleep apnea (adult) (pediatric): Secondary | ICD-10-CM

## 2015-09-10 DIAGNOSIS — M25561 Pain in right knee: Secondary | ICD-10-CM | POA: Insufficient documentation

## 2015-09-10 DIAGNOSIS — I10 Essential (primary) hypertension: Secondary | ICD-10-CM

## 2015-09-10 DIAGNOSIS — F419 Anxiety disorder, unspecified: Secondary | ICD-10-CM | POA: Insufficient documentation

## 2015-09-10 DIAGNOSIS — Z85528 Personal history of other malignant neoplasm of kidney: Secondary | ICD-10-CM | POA: Insufficient documentation

## 2015-09-10 DIAGNOSIS — Z87442 Personal history of urinary calculi: Secondary | ICD-10-CM | POA: Insufficient documentation

## 2015-09-10 DIAGNOSIS — Z79899 Other long term (current) drug therapy: Secondary | ICD-10-CM | POA: Insufficient documentation

## 2015-09-10 DIAGNOSIS — Z95810 Presence of automatic (implantable) cardiac defibrillator: Secondary | ICD-10-CM | POA: Insufficient documentation

## 2015-09-10 LAB — BASIC METABOLIC PANEL
Anion gap: 10 (ref 5–15)
BUN: 18 mg/dL (ref 6–20)
CHLORIDE: 103 mmol/L (ref 101–111)
CO2: 26 mmol/L (ref 22–32)
CREATININE: 1.62 mg/dL — AB (ref 0.61–1.24)
Calcium: 9.2 mg/dL (ref 8.9–10.3)
GFR calc Af Amer: 60 mL/min — ABNORMAL LOW (ref >60)
GFR calc non Af Amer: 52 mL/min — ABNORMAL LOW (ref >60)
GLUCOSE: 151 mg/dL — AB (ref 65–99)
POTASSIUM: 3.9 mmol/L (ref 3.5–5.1)
Sodium: 139 mmol/L (ref 135–145)

## 2015-09-10 MED ORDER — SACUBITRIL-VALSARTAN 24-26 MG PO TABS: 1.0000 | ORAL_TABLET | Freq: Two times a day (BID) | ORAL | Status: DC

## 2015-09-10 NOTE — Patient Instructions (Signed)
STOP Losartan START Entresto 24/26 mg, one tab twice a day  Labs today  Your physician recommends that you schedule a follow-up appointment in: 4 weeks in the NP clinic  Do the following things EVERYDAY: 1) Weigh yourself in the morning before breakfast. Write it down and keep it in a log. 2) Take your medicines as prescribed 3) Eat low salt foods-Limit salt (sodium) to 2000 mg per day.  4) Stay as active as you can everyday 5) Limit all fluids for the day to less than 2 liters 6)

## 2015-09-10 NOTE — Progress Notes (Signed)
Advanced Heart Failure Medication Review by a Pharmacist  Does the patient  feel that his/her medications are working for him/her?  yes  Has the patient been experiencing any side effects to the medications prescribed?  no  Does the patient measure his/her own blood pressure or blood glucose at home?  no   Does the patient have any problems obtaining medications due to transportation or finances?   no  Understanding of regimen: excellent Understanding of indications: good Potential of compliance: good Patient understands to avoid NSAIDs. Patient understands to avoid decongestants.  Issues to address at subsequent visits: None   Pharmacist comments:  Jeffrey [name] is a pleasant 39 yo M presenting without a medication list but with excellent recall of his regimen including dosages. He reports good compliance with his medications and states that his cough has resolved since switching from lisinopril to losartan. He also mentioned that he has been having difficulty sleeping. We discussed attempting melatonin for a few weeks to see if this would help. He did not have any other medication-related questions or concerns for me at this time.   Ruta Hinds. Velva Harman, PharmD, BCPS, CPP Clinical Pharmacist Pager: [phone] Phone: [phone] 09/10/2015 10:42 AM      Time with patient: 8 minutes Preparation and documentation time: 2 minutes Total time: 10 minutes

## 2015-09-10 NOTE — Progress Notes (Signed)
CSW referred to see patient for depression. Patient reports that he was diagnosed with Herat failure in August and has been unable to work. Patient describes himself as active and outgoing person but since August has been struggling with depression. He states this is new and he is hopeful as he feels better and returns to work for mood improvement. Patient has 13 children although only has 2 at home with him now. Patient spoke of his family and his interests. Patient appears to have good coping skills and motivated to improve his situation. Patient denies any need for referrals for counseling at this time. CSW provided supportive intervention and encouraged patient to return to CSW if needed in future. Raquel Sarna, [place]

## 2015-09-17 ENCOUNTER — Encounter: Payer: Self-pay | Admitting: Internal Medicine

## 2015-09-25 ENCOUNTER — Ambulatory Visit: Payer: Self-pay | Admitting: Family Medicine

## 2015-10-08 ENCOUNTER — Encounter (HOSPITAL_COMMUNITY): Payer: Self-pay

## 2015-10-10 ENCOUNTER — Ambulatory Visit (HOSPITAL_COMMUNITY)
Admission: RE | Admit: 2015-10-10 | Discharge: 2015-10-10 | Disposition: A | Discharge status: Home / Self Care | Payer: Self-pay | Source: Ambulatory Visit | Attending: Internal Medicine | Admitting: Internal Medicine

## 2015-10-10 ENCOUNTER — Encounter (HOSPITAL_COMMUNITY): Payer: Self-pay | Admitting: Pharmacist

## 2015-10-10 VITALS — BP 128/88 | HR 88 | Wt 301.0 lb

## 2015-10-10 DIAGNOSIS — N183 Chronic kidney disease, stage 3 unspecified: Secondary | ICD-10-CM

## 2015-10-10 DIAGNOSIS — F4321 Adjustment disorder with depressed mood: Secondary | ICD-10-CM | POA: Insufficient documentation

## 2015-10-10 DIAGNOSIS — Z85528 Personal history of other malignant neoplasm of kidney: Secondary | ICD-10-CM | POA: Insufficient documentation

## 2015-10-10 DIAGNOSIS — I428 Other cardiomyopathies: Secondary | ICD-10-CM | POA: Insufficient documentation

## 2015-10-10 DIAGNOSIS — M109 Gout, unspecified: Secondary | ICD-10-CM | POA: Insufficient documentation

## 2015-10-10 DIAGNOSIS — E1122 Type 2 diabetes mellitus with diabetic chronic kidney disease: Secondary | ICD-10-CM | POA: Insufficient documentation

## 2015-10-10 DIAGNOSIS — Z79899 Other long term (current) drug therapy: Secondary | ICD-10-CM | POA: Insufficient documentation

## 2015-10-10 DIAGNOSIS — Z905 Acquired absence of kidney: Secondary | ICD-10-CM | POA: Insufficient documentation

## 2015-10-10 DIAGNOSIS — M25561 Pain in right knee: Secondary | ICD-10-CM | POA: Insufficient documentation

## 2015-10-10 DIAGNOSIS — I10 Essential (primary) hypertension: Secondary | ICD-10-CM

## 2015-10-10 DIAGNOSIS — E7800 Pure hypercholesterolemia, unspecified: Secondary | ICD-10-CM | POA: Insufficient documentation

## 2015-10-10 DIAGNOSIS — I5022 Chronic systolic (congestive) heart failure: Secondary | ICD-10-CM

## 2015-10-10 DIAGNOSIS — Z886 Allergy status to analgesic agent status: Secondary | ICD-10-CM | POA: Insufficient documentation

## 2015-10-10 DIAGNOSIS — Z794 Long term (current) use of insulin: Secondary | ICD-10-CM | POA: Insufficient documentation

## 2015-10-10 DIAGNOSIS — Z95810 Presence of automatic (implantable) cardiac defibrillator: Secondary | ICD-10-CM | POA: Insufficient documentation

## 2015-10-10 DIAGNOSIS — I130 Hypertensive heart and chronic kidney disease with heart failure and stage 1 through stage 4 chronic kidney disease, or unspecified chronic kidney disease: Secondary | ICD-10-CM | POA: Insufficient documentation

## 2015-10-10 DIAGNOSIS — G4733 Obstructive sleep apnea (adult) (pediatric): Secondary | ICD-10-CM | POA: Insufficient documentation

## 2015-10-10 LAB — BASIC METABOLIC PANEL
Anion gap: 8 (ref 5–15)
BUN: 21 mg/dL — AB (ref 6–20)
CALCIUM: 9.5 mg/dL (ref 8.9–10.3)
CO2: 25 mmol/L (ref 22–32)
CREATININE: 1.86 mg/dL — AB (ref 0.61–1.24)
Chloride: 108 mmol/L (ref 101–111)
GFR calc Af Amer: 51 mL/min — ABNORMAL LOW (ref >60)
GFR, EST NON AFRICAN AMERICAN: 44 mL/min — AB (ref >60)
GLUCOSE: 138 mg/dL — AB (ref 65–99)
Potassium: 4.3 mmol/L (ref 3.5–5.1)
Sodium: 141 mmol/L (ref 135–145)

## 2015-10-10 MED ORDER — CARVEDILOL 6.25 MG PO TABS: 18.7500 mg | ORAL_TABLET | Freq: Two times a day (BID) | ORAL | Status: DC

## 2015-10-10 NOTE — Progress Notes (Signed)
Medication Samples have been provided to the patient.  Drug name: entresto  Qty: 28  LOT: [id number]  Exp.Date: 10/17  The patient has been instructed regarding the correct time, dose, and frequency of taking this medication, including desired effects and most common side effects.   Kerry Dory 10:58 AM 10/10/2015.

## 2015-10-10 NOTE — Patient Instructions (Signed)
INCREASE Carvedilol to 18.75 mg (3 tabs) twice a day  Labs today  Your physician has recommended that you have a flu vaccine, please visit your local pharmacy to receive vaccine.  Your physician recommends that you schedule a follow-up appointment in: 4 weeks  Do the following things EVERYDAY: 1) Weigh yourself in the morning before breakfast. Write it down and keep it in a log. 2) Take your medicines as prescribed 3) Eat low salt foods-Limit salt (sodium) to 2000 mg per day.  4) Stay as active as you can everyday 5) Limit all fluids for the day to less than 2 liters 6)

## 2015-10-10 NOTE — Progress Notes (Signed)
Patient ID: Jeffrey [name], male   DOB: 1976-04-23, 40 y.o.   MRN: [id number]  Patient Name: Jeffrey [name] Date of Encounter: 10/10/2015  Primary Care Provider:  Angelica Chessman, MD Primary Cardiologist:  Lizbeth Bark, MD  Primary HF Cardiologist: Bensimhon  History: Ms. 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    Admitted to [facility] in 3/16 secondary to atypical chest pain, dyspnea, and mild troponin elevation. Echo during hospitalization shows "severely reduced LV function" - EF not quantified due to poor windows. He was diuresed and was down about 7 pounds at discharge, at 289 on the hospital scale.   Admitted 05/14/15 with CP at rest. CXR looked improved from previous. CP resolved spontaneously after hospitalization and troponin was flat. R/LHC showed normal coronaries and moderately elevated filling pressures with moderately depressed cardiac output. He had been previously seen by EP and approved for ICD with chronic low EF. [place] subcutaneous ICD was implanted 05/17/15.  He was diuresed with IV lasix 40 mg. Discharge weight 290 lb.  He returns today for follow up. Last visit losartan was stopped and he was started on entresto 24/26 mg twice a day. Today he says he has only been taking 12.5 mg coreg instead of 18.75 mg twice a day. Overall feeling good. Denies SOB/PND/Orthopnea. Mild dyspnea with steps. Weight at home 295-300 pounds. Able to walk 1-2 miles every other day. Smoking marijuana every now and then. Rarely drink alcohol.   Currently not working.   Echo 05/07/15.  EF 20-25%, grade II diastolic dysfunction, diffuse hypokinesis, moderate LV dilation, mild LVH, normal RV size with mildly decreased systolic function.   Echo in 2012 with EF 20-25%.  In 1/13 had nuclear study EF 45-50% suggestive of NICM.   [facility]  05/17/15 RA = 7 RV = 42/3/7 PA = 43/30 (39) PCW = 26 Fick cardiac output/index = 4.9/2.1 PVR = 2.7 WU SVR = 1651 FA sat = 95% PA sat = 65%, 65% Ao = 122/97 (109) LV = 110/31/33 1) Normal coronaries 2) Moderately elevated filling pressures with moderately depressed cardiac output  Labs (6/16): K 4.1, creatinine 1.52, BNP 133 Labs (06/16/15): K 3.9, creatinine 1.9 Labs (06/28/2015) K 4.1 Creatinine 1.59  Labs (09/10/2015) K 3.9 Creatinine 1.62   Past Medical History   Past Medical History  Diagnosis Date  . Hypertension   . Morbid obesity   . Presumed NICM     a. 04/2014 Myoview: EF 26%, glob HK, sev glob HK, ? prior infarct;  b. Never cathed 2/2 CKD.  [name] Chronic systolic CHF (congestive heart failure)     a. EF 20-25% in 2012. b. EF 45-50% in 10/2011 with nonischemic nuc - presumed NICM. c. 12/2014 Echo: Sev depressed LV fxn, sev dil LV, mild LVH, mild MR, sev dil LA, mildly reduced RV fxn.  . Nephrolithiasis   . OSA on CPAP   . High cholesterol   . Childhood asthma   . Type II diabetes mellitus   . Anxiety   . Renal cell carcinoma     a. s/p Rt robotic assisted partial converted to radical nephrectomy on 01/2013.  . Troponin level elevated     a. 04/2014, 12/2014: felt due to CHF.  . CKD (chronic kidney disease) stage 2, GFR 60-89 ml/min   . Aspirin allergy    Past Surgical History  Procedure Laterality Date  . Robotic assited partial nephrectomy Right 01/13/2013    Procedure: ROBOTIC ASSITED PARTIAL NEPHRECTOMY CONVERTED TO ROBOTIC ASSISTED RIGHT RADICAL NEPHRECTOMY;  Surgeon: Alexis Frock, MD;  [facility]: [facility];  Service: Urology;  Laterality: Right;  . Robotic assisted laparoscopic lysis of adhesion  01/13/2013    Procedure: ROBOTIC ASSISTED LAPAROSCOPIC LYSIS OF ADHESION EXTENSIVE;  Surgeon: Alexis Frock, MD;  Location: [facility];  Service: Urology;;  . Appendectomy  07/2004  . Cardiac catheterization N/A 05/17/2015    Procedure: Right/Left Heart Cath and Coronary  Angiography;  Surgeon: Jolaine Artist, MD;  Location: [place] [facility];  Service: Cardiovascular;  Laterality: N/A;  . Ep implantable device N/A 05/17/2015    Procedure: SubQ ICD Implant;  Surgeon: Will Meredith Leeds, MD;  Location: [place] [facility];  Service: Cardiovascular;  Laterality: N/A;    Allergies  Allergies  Allergen Reactions  . Aspirin Shortness Of Breath, Itching and Rash     Burning sensation (Patient reports he tolerates other NSAIDS)   . Bee Venom Hives and Swelling  . Tomato Rash     Home Medications Current Outpatient Prescriptions  Medication Sig Dispense Refill  . allopurinol (ZYLOPRIM) 100 MG tablet Take 1 tablet (100 mg total) by mouth daily. 30 tablet 2  . atorvastatin (LIPITOR) 80 MG tablet TAKE 1 TABLET (80 MG TOTAL) BY MOUTH EVERY EVENING. 30 tablet 6  . carvedilol (COREG) 6.25 MG tablet Take 12.5 mg by mouth 2 (two) times daily with a meal.    . hydrALAZINE (APRESOLINE) 25 MG tablet Take 1 tablet (25 mg total) by mouth 3 (three) times daily. 90 tablet 3  . insulin NPH-regular Human (NOVOLIN 70/30) (70-30) 100 UNIT/ML injection Inject 34 Units into the skin 2 (two) times daily with a meal. 4 vial 2  . isosorbide mononitrate (IMDUR) 30 MG 24 hr tablet Take 1 tablet (30 mg total) by mouth daily. 30 tablet 11  . Melatonin 5 MG TABS Take 5 mg by mouth at bedtime.    . sacubitril-valsartan (ENTRESTO) 24-26 MG Take 1 tablet by mouth 2 (two) times daily. 60 tablet 11  . spironolactone (ALDACTONE) 25 MG tablet Take 1 tablet (25 mg total) by mouth daily. 30 tablet 6  . torsemide (DEMADEX) 20 MG tablet Take 1 tablet (20 mg total) by mouth daily. 60 tablet 3  . [DISCONTINUED] cetirizine (ZYRTEC ALLERGY) 10 MG tablet Take 1 tablet (10 mg total) by mouth daily. 30 tablet 1   No current facility-administered medications for this encounter.      Review of Systems  As above, he has been feeling well without dyspnea on exertion, PND, edema, or early satiety.  He  does have chronic orthopnea. He reports compliance with medications.  All other systems reviewed and are otherwise negative except as noted above.  Physical Exam  VS:  BP 128/88 mmHg  Pulse 88  Wt 301 lb (136.533 kg)  SpO2 98% , Body mass index is 47.13 kg/(m^2).   GEN: Well nourished, well developed, in no acute distress. HEENT: normal. Neck: Supple, JVP 5-6  carotid bruits, or masses. Cardiac: RRR, no murmurs, rubs, or gallops. No clubbing, cyanosis, or edema.  Radials/DP/PT 2+ and equal bilaterally.  Respiratory: Clear  GI: Obese, soft, nontender, nondistended, BS + x 4. MS: no deformity or atrophy. R and LLE no edema.   Skin: warm and dry, no rash. Neuro:  Strength and sensation are intact. Psych: Normal affect.  Assessment & Plan  1.  Chronic systolic congestive heart failure: s/p [employer] subcutaneous ICD implant 05/17/15.  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 Echo 05/07/15 with 20-25% EF.  CMP may be due to HTN.   NYHA class II symptoms. Volume status stable,   Continue torsemide 20 mg daily.  -Continue entresto 24-26 mg twice a day. Entresto [employer] application sent in today.   - Increase coreg 18.75 mg bid.  - Continue hydralazine to 25 mg TID and continue Imdur 30 mg daily.  -Continue spironolactone to 25 mg daily  - He plans to call EP for follow up.  2. Hypertension: Continue entresto, hydralazine, imdur at current dose. Increase coreg as above.  3. CKD stage III: Follow creatinine closely. Check BMET today  4. OSA: Using CPAP from time to time. Encouraged to use daily.  5. Tobacco use: quit a couple of months ago.    6. Gout: Continue allopurinol 200 daily for prophylaxis.  7. DM2: Back on insulin.  Has follow up at [place].  8. R knee pain. Followed by ortho.  9. ? Situational depression- Referred to HF SW for coping mechanisms.  10. Immunization- Has had pneumococcal vaccine in 2013. I have asked him to obtain flu vaccine at his  local pharmacy. He verbalized understanding.  Check BMET today and at next visit.  Follow up 4 weeks.  Amy Ninfa Meeker, NP-C  10:19 AM

## 2015-10-10 NOTE — Progress Notes (Signed)
Advanced Heart Failure Medication Review by a Pharmacist  Does the patient  feel that his/her medications are working for him/her?  yes  Has the patient been experiencing any side effects to the medications prescribed?  no  Does the patient measure his/her own blood pressure or blood glucose at home?  no   Does the patient have any problems obtaining medications due to transportation or finances?   no  Understanding of regimen: good Understanding of indications: good Potential of compliance: good Patient understands to avoid NSAIDs. Patient understands to avoid decongestants.  Issues to address at subsequent visits: None   Pharmacist comments:  [name] is a pleasant 40 yo M presenting without a medication list but with good recall of his regimen including most dosages. He reports good compliance with his medications. He does state that he has tried melatonin 5 mg for about 2 weeks now without much improvement. I have recommended doubling the dose to see if that works. I will send in [employer] patient assistance today. He states that he has not been able to find a job yet but is still working on it.   Ruta Hinds. Velva Harman, PharmD, BCPS, CPP Clinical Pharmacist Pager: [phone] Phone: [phone] 10/10/2015 10:31 AM      Time with patient: 8 minutes Preparation and documentation time: 4 minutes Total time: 12 minutes

## 2015-10-29 ENCOUNTER — Telehealth (HOSPITAL_COMMUNITY): Payer: Self-pay | Admitting: Pharmacist

## 2015-10-29 NOTE — Telephone Encounter (Signed)
[employer] patient assistance approved for Entresto until 10/27/2016 at no out-of-pocket cost.   Doroteo Bradford K. Velva Harman, PharmD, BCPS, CPP Clinical Pharmacist Pager: [phone] Phone: [phone] 10/29/2015 3:48 PM

## 2015-11-07 ENCOUNTER — Encounter: Payer: Self-pay | Admitting: Licensed Clinical Social Worker

## 2015-11-07 ENCOUNTER — Ambulatory Visit (HOSPITAL_COMMUNITY)
Admission: RE | Admit: 2015-11-07 | Discharge: 2015-11-07 | Disposition: A | Discharge status: Home / Self Care | Payer: Self-pay | Source: Ambulatory Visit | Attending: Internal Medicine | Admitting: Internal Medicine

## 2015-11-07 ENCOUNTER — Encounter (HOSPITAL_COMMUNITY): Payer: Self-pay

## 2015-11-07 VITALS — BP 92/76 | HR 84 | Wt 300.4 lb

## 2015-11-07 DIAGNOSIS — Z794 Long term (current) use of insulin: Secondary | ICD-10-CM | POA: Insufficient documentation

## 2015-11-07 DIAGNOSIS — I428 Other cardiomyopathies: Secondary | ICD-10-CM | POA: Insufficient documentation

## 2015-11-07 DIAGNOSIS — Z85528 Personal history of other malignant neoplasm of kidney: Secondary | ICD-10-CM | POA: Insufficient documentation

## 2015-11-07 DIAGNOSIS — C649 Malignant neoplasm of unspecified kidney, except renal pelvis: Secondary | ICD-10-CM

## 2015-11-07 DIAGNOSIS — Z888 Allergy status to other drugs, medicaments and biological substances status: Secondary | ICD-10-CM | POA: Insufficient documentation

## 2015-11-07 DIAGNOSIS — Z87891 Personal history of nicotine dependence: Secondary | ICD-10-CM | POA: Insufficient documentation

## 2015-11-07 DIAGNOSIS — E1122 Type 2 diabetes mellitus with diabetic chronic kidney disease: Secondary | ICD-10-CM | POA: Insufficient documentation

## 2015-11-07 DIAGNOSIS — Z886 Allergy status to analgesic agent status: Secondary | ICD-10-CM | POA: Insufficient documentation

## 2015-11-07 DIAGNOSIS — E7800 Pure hypercholesterolemia, unspecified: Secondary | ICD-10-CM | POA: Insufficient documentation

## 2015-11-07 DIAGNOSIS — Z95810 Presence of automatic (implantable) cardiac defibrillator: Secondary | ICD-10-CM | POA: Insufficient documentation

## 2015-11-07 DIAGNOSIS — Z905 Acquired absence of kidney: Secondary | ICD-10-CM | POA: Insufficient documentation

## 2015-11-07 DIAGNOSIS — Z79899 Other long term (current) drug therapy: Secondary | ICD-10-CM | POA: Insufficient documentation

## 2015-11-07 DIAGNOSIS — I5022 Chronic systolic (congestive) heart failure: Secondary | ICD-10-CM | POA: Insufficient documentation

## 2015-11-07 DIAGNOSIS — G4733 Obstructive sleep apnea (adult) (pediatric): Secondary | ICD-10-CM | POA: Insufficient documentation

## 2015-11-07 DIAGNOSIS — I10 Essential (primary) hypertension: Secondary | ICD-10-CM

## 2015-11-07 DIAGNOSIS — I130 Hypertensive heart and chronic kidney disease with heart failure and stage 1 through stage 4 chronic kidney disease, or unspecified chronic kidney disease: Secondary | ICD-10-CM | POA: Insufficient documentation

## 2015-11-07 DIAGNOSIS — N183 Chronic kidney disease, stage 3 unspecified: Secondary | ICD-10-CM

## 2015-11-07 LAB — CBC
HEMATOCRIT: 44.5 % (ref 39.0–52.0)
HEMOGLOBIN: 14.9 g/dL (ref 13.0–17.0)
MCH: 24.3 pg — ABNORMAL LOW (ref 26.0–34.0)
MCHC: 33.5 g/dL (ref 30.0–36.0)
MCV: 72.6 fL — ABNORMAL LOW (ref 78.0–100.0)
Platelets: 300 10*3/uL (ref 150–400)
RBC: 6.13 MIL/uL — ABNORMAL HIGH (ref 4.22–5.81)
RDW: 16.8 % — ABNORMAL HIGH (ref 11.5–15.5)
WBC: 7.5 10*3/uL (ref 4.0–10.5)

## 2015-11-07 LAB — BASIC METABOLIC PANEL
ANION GAP: 10 (ref 5–15)
BUN: 24 mg/dL — ABNORMAL HIGH (ref 6–20)
CHLORIDE: 106 mmol/L (ref 101–111)
CO2: 24 mmol/L (ref 22–32)
Calcium: 9.3 mg/dL (ref 8.9–10.3)
Creatinine, Ser: 2.17 mg/dL — ABNORMAL HIGH (ref 0.61–1.24)
GFR calc Af Amer: 42 mL/min — ABNORMAL LOW (ref >60)
GFR, EST NON AFRICAN AMERICAN: 36 mL/min — AB (ref >60)
Glucose, Bld: 124 mg/dL — ABNORMAL HIGH (ref 65–99)
POTASSIUM: 4 mmol/L (ref 3.5–5.1)
SODIUM: 140 mmol/L (ref 135–145)

## 2015-11-07 MED ORDER — ISOSORBIDE MONONITRATE ER 30 MG PO TB24: 30.0000 mg | ORAL_TABLET | Freq: Every day | ORAL | Status: DC

## 2015-11-07 MED ORDER — SPIRONOLACTONE 25 MG PO TABS: 25.0000 mg | ORAL_TABLET | Freq: Every day | ORAL | Status: DC

## 2015-11-07 MED ORDER — CARVEDILOL 6.25 MG PO TABS: 18.7500 mg | ORAL_TABLET | Freq: Two times a day (BID) | ORAL | Status: DC

## 2015-11-07 MED ORDER — TORSEMIDE 20 MG PO TABS: 20.0000 mg | ORAL_TABLET | Freq: Every day | ORAL | Status: DC

## 2015-11-07 MED ORDER — ATORVASTATIN CALCIUM 80 MG PO TABS: ORAL_TABLET | ORAL | Status: DC

## 2015-11-07 MED ORDER — HYDRALAZINE HCL 25 MG PO TABS: 25.0000 mg | ORAL_TABLET | Freq: Three times a day (TID) | ORAL | Status: DC

## 2015-11-07 MED FILL — ATORVASTATIN 80 MG TABLET: 80 | 30 days supply | Qty: 30 | Fill #0

## 2015-11-07 MED FILL — CARVEDILOL 6.25 MG TABLET: 6.25 | 30 days supply | Qty: 180 | Fill #0

## 2015-11-07 MED FILL — ISOSORBIDE MN ER 30 MG TAB: 30 | 30 days supply | Qty: 30 | Fill #0

## 2015-11-07 MED FILL — hydrALAZINE HCL 25 MG TABS: 25 | 30 days supply | Qty: 90 | Fill #0

## 2015-11-07 MED FILL — ?SPIRONOLACTONE 25 MG TABLE: 25 | 30 days supply | Qty: 30 | Fill #0

## 2015-11-07 NOTE — Progress Notes (Signed)
CSW referred to assist patient with insurance/PCP resources. Patient reports that he has no income since August and has been residing with his fiancee. He states he thinks he has a pending medicaid although has not heard anything since August. CSW encouraged patient to go to Social Services to follow up on medicaid application or possible need to reapply. CSW contacted [facility] and scheduled PCP visit for tomorrow at 3:15pm. Patient verbalizes understanding of follow up and will contact CSW if further needs arise. CSW available as needed. Raquel Sarna, [place]

## 2015-11-07 NOTE — Patient Instructions (Signed)
Follow up in 6 weeks  Do the following things EVERYDAY: 1) Weigh yourself in the morning before breakfast. Write it down and keep it in a log. 2) Take your medicines as prescribed 3) Eat low salt foods-Limit salt (sodium) to 2000 mg per day.  4) Stay as active as you can everyday 5) Limit all fluids for the day to less than 2 liters 

## 2015-11-07 NOTE — Progress Notes (Signed)
Patient ID: Jeffrey [name], male   DOB: April 08, 1976, 40 y.o.   MRN: [id number]  Patient Name: DELANTE [name] Date of Encounter: 11/07/2015  Primary Care Provider:  Angelica Chessman, MD Primary Cardiologist:  Lizbeth Bark, MD  Primary HF Cardiologist: Bensimhon  History: Ms. 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    Admitted to [facility] in 3/16 secondary to atypical chest pain, dyspnea, and mild troponin elevation. Echo during hospitalization shows "severely reduced LV function" - EF not quantified due to poor windows. He was diuresed and was down about 7 pounds at discharge, at 289 on the hospital scale.   Admitted 05/14/15 with CP at rest. CXR looked improved from previous. CP resolved spontaneously after hospitalization and troponin was flat. R/LHC showed normal coronaries and moderately elevated filling pressures with moderately depressed cardiac output. He had been previously seen by EP and approved for ICD with chronic low EF. [place] subcutaneous ICD was implanted 05/17/15.  He was diuresed with IV lasix 40 mg. Discharge weight 290 lb.  He returns today for follow up. Last visit coreg was increased to 18.75 mg twice a day. Overall feeling ok. Getting over a cold. Denies SOB/PND/Orthopnea. Denies dyspnea with steps. Weight at home 296-300 pounds.  He reports BRBPR about every 2-3 months. Smoking marijuana every now and then.  Rarely drink alcohol.   Currently not working. Medicaid pending.   Echo 05/07/15.  EF 20-25%, grade II diastolic dysfunction, diffuse hypokinesis, moderate LV dilation, mild LVH, normal RV size with mildly decreased systolic function.   Echo in 2012 with EF 20-25%.  In 1/13 had nuclear study EF 45-50% suggestive of NICM.   [facility] 05/17/15 RA = 7 RV = 42/3/7 PA = 43/30 (39) PCW = 26 Fick cardiac  output/index = 4.9/2.1 PVR = 2.7 WU SVR = 1651 FA sat = 95% PA sat = 65%, 65% Ao = 122/97 (109) LV = 110/31/33 1) Normal coronaries 2) Moderately elevated filling pressures with moderately depressed cardiac output  Labs (6/16): K 4.1, creatinine 1.52, BNP 133 Labs (06/16/15): K 3.9, creatinine 1.9 Labs (06/28/2015) K 4.1 Creatinine 1.59  Labs (09/10/2015) K 3.9 Creatinine 1.62   Past Medical History   Past Medical History  Diagnosis Date  . Hypertension   . Morbid obesity ([place])   . Presumed NICM     a. 04/2014 Myoview: EF 26%, glob HK, sev glob HK, ? prior infarct;  b. Never cathed 2/2 CKD.  Jeffrey [name] Chronic systolic CHF (congestive heart failure) ([place])     a. EF 20-25% in 2012. b. EF 45-50% in 10/2011 with nonischemic nuc - presumed NICM. c. 12/2014 Echo: Sev depressed LV fxn, sev dil LV, mild LVH, mild MR, sev dil LA, mildly reduced RV fxn.  . Nephrolithiasis   . OSA on CPAP   . High cholesterol   . Childhood asthma   . Type II diabetes mellitus ([place])   . Anxiety   . Renal cell carcinoma ([place])     a. s/p Rt robotic assisted partial converted to radical nephrectomy on 01/2013.  . Troponin level elevated     a. 04/2014, 12/2014: felt due to CHF.  . CKD (chronic kidney disease) stage 2, GFR 60-89 ml/min   . Aspirin allergy    Past Surgical History  Procedure Laterality Date  . Robotic assited partial nephrectomy Right  01/13/2013    Procedure: ROBOTIC ASSITED PARTIAL NEPHRECTOMY CONVERTED TO ROBOTIC ASSISTED RIGHT RADICAL NEPHRECTOMY;  Surgeon: Alexis Frock, MD;  Location: [facility];  Service: Urology;  Laterality: Right;  . Robotic assisted laparoscopic lysis of adhesion  01/13/2013    Procedure: ROBOTIC ASSISTED LAPAROSCOPIC LYSIS OF ADHESION EXTENSIVE;  Surgeon: Alexis Frock, MD;  Location: [facility];  Service: Urology;;  . Appendectomy  07/2004  . Cardiac catheterization N/A 05/17/2015    Procedure: Right/Left Heart Cath and Coronary Angiography;  Surgeon: Jolaine Artist, MD;   Location: [place] [facility];  Service: Cardiovascular;  Laterality: N/A;  . Ep implantable device N/A 05/17/2015    Procedure: SubQ ICD Implant;  Surgeon: Will Meredith Leeds, MD;  Location: [place] [facility];  Service: Cardiovascular;  Laterality: N/A;    Allergies  Allergies  Allergen Reactions  . Aspirin Shortness Of Breath, Itching and Rash     Burning sensation (Patient reports he tolerates other NSAIDS)   . Bee Venom Hives and Swelling  . Lisinopril Cough  . Tomato Rash     Home Medications Current Outpatient Prescriptions  Medication Sig Dispense Refill  . allopurinol (ZYLOPRIM) 100 MG tablet Take 1 tablet (100 mg total) by mouth daily. 30 tablet 2  . atorvastatin (LIPITOR) 80 MG tablet TAKE 1 TABLET (80 MG TOTAL) BY MOUTH EVERY EVENING. 30 tablet 6  . carvedilol (COREG) 6.25 MG tablet Take 3 tablets (18.75 mg total) by mouth 2 (two) times daily with a meal. 180 tablet 3  . hydrALAZINE (APRESOLINE) 25 MG tablet Take 1 tablet (25 mg total) by mouth 3 (three) times daily. 90 tablet 3  . insulin NPH-regular Human (NOVOLIN 70/30) (70-30) 100 UNIT/ML injection Inject 34 Units into the skin 2 (two) times daily with a meal. 4 vial 2  . isosorbide mononitrate (IMDUR) 30 MG 24 hr tablet Take 1 tablet (30 mg total) by mouth daily. 30 tablet 11  . Melatonin 5 MG TABS Take 5 mg by mouth at bedtime.    . sacubitril-valsartan (ENTRESTO) 24-26 MG Take 1 tablet by mouth 2 (two) times daily. 60 tablet 11  . spironolactone (ALDACTONE) 25 MG tablet Take 1 tablet (25 mg total) by mouth daily. 30 tablet 6  . torsemide (DEMADEX) 20 MG tablet Take 1 tablet (20 mg total) by mouth daily. 60 tablet 3  . [DISCONTINUED] cetirizine (ZYRTEC ALLERGY) 10 MG tablet Take 1 tablet (10 mg total) by mouth daily. 30 tablet 1   No current facility-administered medications for this encounter.      Review of Systems  As above, he has been feeling well without dyspnea on exertion, PND, edema, or early satiety.   He does have chronic orthopnea. He reports compliance with medications.  All other systems reviewed and are otherwise negative except as noted above.  Physical Exam  VS:  BP 92/76 mmHg  Pulse 84  Wt 300 lb 6.4 oz (136.261 kg)  SpO2 94% , Body mass index is 47.04 kg/(m^2).   GEN: Well nourished, well developed, in no acute distress. HEENT: normal. Neck: Supple, JVP 5-6  carotid bruits, or masses. Cardiac: RRR, no murmurs, rubs, or gallops. No clubbing, cyanosis, or edema.  Radials/DP/PT 2+ and equal bilaterally.  Respiratory: Clear  GI: Obese, soft, nontender, nondistended, BS + x 4. MS: no deformity or atrophy. R and LLE no edema.   Skin: warm and dry, no rash. Neuro:  Strength and sensation are intact. Psych: Normal affect.  Assessment & Plan  1.  Chronic systolic congestive heart failure: s/p [employer] subcutaneous ICD implant 05/17/15.  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 Echo 05/07/15 with 20-25% EF.  CMP may be due to HTN.   NYHA class II symptoms. Volume status stable. May need to cut back diuretics after review of BMET.    Continue torsemide 20 mg daily.  -Continue entresto 24-26 mg twice a day.  - Continue coreg 18.75 mg bid.  - Continue hydralazine to 25 mg TID and continue Imdur 30 mg daily.  -Continue spironolactone to 25 mg daily  -Congratulated on medication compliance.  2. Hypertension: Controlled. Continue entresto, hydralazine, imdur at current dose. Increase coreg as above.  3. CKD stage III: Follow creatinine closely. Check BMET today  4. OSA: Using CPAP from time to time. Encouraged to use daily.  5. Tobacco use: quit a couple of months ago.    6. Gout: Continue allopurinol 200 daily for prophylaxis.  7. DM2: Back on insulin.  Has follow up at [facility].  8. R knee pain.: resolved.   9. ? Situational depression- HF SW for coping mechanisms.  10.Renal Cell Carcinoma - S/P nephrectomy 2014.  [place]. - BRBPR every few months.  Refer back to [facility]. Check CBC.    Check BMET and CBC today.  Follow up 6 weeks.  Amy Clegg, NP-C  11:04 AM

## 2015-11-08 ENCOUNTER — Encounter: Payer: Self-pay | Admitting: Family Medicine

## 2015-11-08 ENCOUNTER — Ambulatory Visit: Payer: Self-pay | Attending: Family Medicine | Admitting: Family Medicine

## 2015-11-08 VITALS — BP 119/86 | HR 90 | Temp 98.7°F | Resp 16 | Ht 67.0 in | Wt 301.0 lb

## 2015-11-08 DIAGNOSIS — I5022 Chronic systolic (congestive) heart failure: Secondary | ICD-10-CM

## 2015-11-08 DIAGNOSIS — Z794 Long term (current) use of insulin: Secondary | ICD-10-CM

## 2015-11-08 DIAGNOSIS — E1122 Type 2 diabetes mellitus with diabetic chronic kidney disease: Secondary | ICD-10-CM

## 2015-11-08 DIAGNOSIS — N183 Chronic kidney disease, stage 3 unspecified: Secondary | ICD-10-CM

## 2015-11-08 DIAGNOSIS — K921 Melena: Secondary | ICD-10-CM | POA: Insufficient documentation

## 2015-11-08 LAB — GLUCOSE, POCT (MANUAL RESULT ENTRY): POC GLUCOSE: 118 mg/dL — AB (ref 70–99)

## 2015-11-08 LAB — POCT GLYCOSYLATED HEMOGLOBIN (HGB A1C): HEMOGLOBIN A1C: 6

## 2015-11-08 NOTE — Patient Instructions (Signed)
Gastrointestinal Bleeding °Gastrointestinal (GI) bleeding means there is bleeding somewhere along the digestive tract, between the mouth and anus. °CAUSES  °There are many different problems that can cause GI bleeding. Possible causes include: °· Esophagitis. This is inflammation, irritation, or swelling of the esophagus. °· Hemorrhoids. These are veins that are full of blood (engorged) in the rectum. They cause pain, inflammation, and may bleed. °· Anal fissures. These are areas of painful tearing which may bleed. They are often caused by passing hard stool. °· Diverticulosis. These are pouches that form on the colon over time, with age, and may bleed significantly. °· Diverticulitis. This is inflammation in areas with diverticulosis. It can cause pain, fever, and bloody stools, although bleeding is rare. °· Polyps and cancer. Colon cancer often starts out as precancerous polyps. °· Gastritis and ulcers. Bleeding from the upper gastrointestinal tract (near the stomach) may travel through the intestines and produce black, sometimes tarry, often bad smelling stools. In certain cases, if the bleeding is fast enough, the stools may not be black, but red. This condition may be life-threatening. °SYMPTOMS  °· Vomiting bright red blood or material that looks like coffee grounds. °· Bloody, black, or tarry stools. °DIAGNOSIS  °Your caregiver may diagnose your condition by taking your history and performing a physical exam. More tests may be needed, including: °· X-rays and other imaging tests. °· Esophagogastroduodenoscopy (EGD). This test uses a flexible, lighted tube to look at your esophagus, stomach, and small intestine. °· Colonoscopy. This test uses a flexible, lighted tube to look at your colon. °TREATMENT  °Treatment depends on the cause of your bleeding.  °· For bleeding from the esophagus, stomach, small intestine, or colon, the caregiver doing your EGD or colonoscopy may be able to stop the bleeding as part of  the procedure. °· Inflammation or infection of the colon can be treated with medicines. °· Many rectal problems can be treated with creams, suppositories, or warm baths. °· Surgery is sometimes needed. °· Blood transfusions are sometimes needed if you have lost a lot of blood. °If bleeding is slow, you may be allowed to go home. If there is a lot of bleeding, you will need to stay in the hospital for observation. °HOME CARE INSTRUCTIONS  °· Take any medicines exactly as prescribed. °· Keep your stools soft by eating foods that are high in fiber. These foods include whole grains, legumes, fruits, and vegetables. Prunes (1 to 3 a day) work well for many people. °· Drink enough fluids to keep your urine clear or pale yellow. °SEEK IMMEDIATE MEDICAL CARE IF:  °· Your bleeding increases. °· You feel lightheaded, weak, or you faint. °· You have severe cramps in your back or abdomen. °· You pass large blood clots in your stool. °· Your problems are getting worse. °MAKE SURE YOU:  °· Understand these instructions. °· Will watch your condition. °· Will get help right away if you are not doing well or get worse. °  °This information is not intended to replace advice given to you by your health care provider. Make sure you discuss any questions you have with your health care provider. °  °Document Released: 09/18/2000 Document Revised: 09/07/2012 Document Reviewed: 03/11/2015 °Elsevier Interactive Patient Education ©2016 Elsevier Inc. ° °

## 2015-11-08 NOTE — Progress Notes (Signed)
Subjective:  Patient ID: Jeffrey [name], male    DOB: 1976/03/27  Age: 40 y.o. MRN: [id number]  CC: Blood Sugar Problem   HPI Jeffrey [name] he is a 40 year old male with a history of type 2 diabetes mellitus (A1c 6.0) chronic systolic heart failure status post ICD (EF 20-25%), stage IIIc daily, hypertension, obesity who presents to the clinic complaining of a one-month history of intermittent hematochezia which she describes does blood mixed with his stool. He denies constipation or diarrhea or abdominal pain and has a fairly good appetite with no nausea or vomiting; he denies a family history of colon cancer or inflammatory bowel disease. Most recent CBC from yesterday shows a stable hemoglobin 14.9.  He was seen by the heart failure clinic yesterday and has been compliant with his heart failure regimen. He recently had a jump in creatinine from 1.86-2.17 in one month. Denies shortness of breath or pedal edema or chest pains.  Outpatient Prescriptions Prior to Visit  Medication Sig Dispense Refill  . allopurinol (ZYLOPRIM) 100 MG tablet Take 1 tablet (100 mg total) by mouth daily. 30 tablet 2  . atorvastatin (LIPITOR) 80 MG tablet TAKE 1 TABLET (80 MG TOTAL) BY MOUTH EVERY EVENING. 30 tablet 6  . carvedilol (COREG) 6.25 MG tablet Take 3 tablets (18.75 mg total) by mouth 2 (two) times daily with a meal. 180 tablet 3  . hydrALAZINE (APRESOLINE) 25 MG tablet Take 1 tablet (25 mg total) by mouth 3 (three) times daily. 90 tablet 3  . insulin NPH-regular Human (NOVOLIN 70/30) (70-30) 100 UNIT/ML injection Inject 34 Units into the skin 2 (two) times daily with a meal. 4 vial 2  . isosorbide mononitrate (IMDUR) 30 MG 24 hr tablet Take 1 tablet (30 mg total) by mouth daily. 30 tablet 11  . Melatonin 5 MG TABS Take 5 mg by mouth at bedtime.    [name] spironolactone (ALDACTONE) 25 MG tablet Take 1 tablet (25 mg total) by mouth daily. 30 tablet 6  . torsemide (DEMADEX) 20 MG tablet Take 1 tablet (20 mg total)  by mouth daily. 60 tablet 3  . sacubitril-valsartan (ENTRESTO) 24-26 MG Take 1 tablet by mouth 2 (two) times daily. 60 tablet 11   No facility-administered medications prior to visit.    ROS Review of Systems  Constitutional: Negative for activity change and appetite change.  HENT: Negative for sinus pressure and sore throat.   Eyes: Negative for visual disturbance.  Respiratory: Negative for cough, chest tightness and shortness of breath.   Cardiovascular: Negative for chest pain and leg swelling.  Gastrointestinal:       See history of present illness  Endocrine: Negative.   Genitourinary: Negative for dysuria.  Musculoskeletal: Negative for myalgias and joint swelling.  Skin: Negative for rash.  Allergic/Immunologic: Negative.   Neurological: Negative for weakness, light-headedness and numbness.  Psychiatric/Behavioral: Negative for suicidal ideas and dysphoric mood.    Objective:  BP 119/86 mmHg  Pulse 90  Temp(Src) 98.7 F (37.1 C) (Oral)  Resp 16  Ht 5\' 7"  (1.702 m)  Wt 301 lb (136.533 kg)  BMI 47.13 kg/m2  SpO2 97%  BP/Weight 11/08/2015 [id number]  Systolic BP 123456 92 0000000  Diastolic BP 86 76 88  Wt. (Lbs) 301 300.4 301  BMI 47.13 47.04 47.13      Physical Exam  Genitourinary:  Refused rectal exam    CBC Latest Ref Rng 11/07/2015 06/16/2015 05/18/2015  WBC 4.0 - 10.5 K/uL 7.5 13.8(H) 8.7  Hemoglobin 13.0 - 17.0 g/dL 14.9 14.9 13.1  Hematocrit 39.0 - 52.0 % 44.5 43.0 40.7  Platelets 150 - 400 K/uL 300 320 280     CMP Latest Ref Rng 11/07/2015 10/10/2015 09/10/2015  Glucose 65 - 99 mg/dL 124(H) 138(H) 151(H)  BUN 6 - 20 mg/dL 24(H) 21(H) 18  Creatinine 0.61 - 1.24 mg/dL 2.17(H) 1.86(H) 1.62(H)  Sodium 135 - 145 mmol/L 140 141 139  Potassium 3.5 - 5.1 mmol/L 4.0 4.3 3.9  Chloride 101 - 111 mmol/L 106 108 103  CO2 22 - 32 mmol/L 24 25 26   Calcium 8.9 - 10.3 mg/dL 9.3 9.5 9.2  Total Protein 6.0 - 8.3 g/dL - - -  Total Bilirubin 0.3 - 1.2 mg/dL - - -    Alkaline Phos 39 - 117 U/L - - -  AST 0 - 37 U/L - - -  ALT 0 - 53 U/L - - -     Assessment & Plan:   1. Type 2 diabetes mellitus with stage 3 chronic kidney disease, with long-term current use of insulin (HCC) Controlled with A1c of 6.0. Continue insulin regimen Keep appointment with PCP for follow-up of diabetes mellitus - POCT A1C - POCT glucose (manual entry)  2. CKD (chronic kidney disease), stage III Creatinine trending up from 1.86-2.17 Advised to decrease torsemide from 20 mg to 10 mg Need a repeat CMET at his next office visit.  3. Chronic systolic heart failure (HCC) Not in acute failure at this time. He has been advised that if symptoms of CHF exacerbation developed he couldn't go back up on his torsemide to 20 mg and heand notify the heart failure clinic of this change in regimen  4. Hematochezia Unable to perform Hemoccult due to patient's refusal. Hemoglobin is normal He will need to see GI for colonoscopy and I have advised him to work on his Jeffrey [name] discount application to facilitate the process. He knows to call the clinic back in the event that this is approved so I can place the referral. Meanwhile ED and return precautions discussed   No orders of the defined types were placed in this encounter.    Follow-up: Return in about 1 month (around 12/06/2015), or if symptoms worsen or fail to improve, for follow up of Gi bleed with PCP- Dr Doreene Burke.   Arnoldo Morale MD

## 2015-11-08 NOTE — Progress Notes (Signed)
Patient reports blood in stool x 2 days ago. Since then blood has subsided.  Patient denies any pain today.  Patient decline the flu shot.

## 2015-11-12 ENCOUNTER — Telehealth (HOSPITAL_COMMUNITY): Payer: Self-pay | Admitting: Cardiology

## 2015-11-12 MED ORDER — TORSEMIDE 20 MG PO TABS: 20.0000 mg | ORAL_TABLET | ORAL | Status: DC

## 2015-11-12 NOTE — Telephone Encounter (Signed)
-----   Message from Conrad Arnold, NP sent at 11/07/2015  1:34 PM EST ----- Please call hold torsemide for 2 days. Then restart torsemide 20 mg every other day.

## 2015-11-12 NOTE — Telephone Encounter (Signed)
Pt aware.

## 2015-11-13 ENCOUNTER — Telehealth (HOSPITAL_COMMUNITY): Payer: Self-pay | Admitting: *Deleted

## 2015-11-13 NOTE — Telephone Encounter (Signed)
Left voice message for pt to call [phone] for entresto. They have been trying to reach him but he is not answering.

## 2015-11-15 ENCOUNTER — Encounter: Payer: Self-pay | Admitting: Internal Medicine

## 2015-11-15 ENCOUNTER — Ambulatory Visit (INDEPENDENT_AMBULATORY_CARE_PROVIDER_SITE_OTHER): Payer: Self-pay | Admitting: Internal Medicine

## 2015-11-15 VITALS — BP 142/110 | HR 92 | Ht 67.0 in | Wt 312.0 lb

## 2015-11-15 DIAGNOSIS — I428 Other cardiomyopathies: Secondary | ICD-10-CM

## 2015-11-15 DIAGNOSIS — I429 Cardiomyopathy, unspecified: Secondary | ICD-10-CM

## 2015-11-15 DIAGNOSIS — I5022 Chronic systolic (congestive) heart failure: Secondary | ICD-10-CM

## 2015-11-15 DIAGNOSIS — Z95810 Presence of automatic (implantable) cardiac defibrillator: Secondary | ICD-10-CM

## 2015-11-15 LAB — CUP PACEART INCLINIC DEVICE CHECK
Date Time Interrogation Session: 20170210115349
Implantable Lead Location: 753858
Implantable Lead Model: 3401
MDC IDC LEAD IMPLANT DT: 20160812
MDC IDC PG SERIAL: 117162

## 2015-11-15 NOTE — Patient Instructions (Signed)
Medication Instructions: - Your physician recommends that you continue on your current medications as directed. Please refer to the Current Medication list given to you today.  Labwork: - none  Procedures/Testing: - none  Follow-Up: - Your physician wants you to follow-up in: 6 months with Dr. Klein. You will receive a reminder letter in the mail two months in advance. If you don't receive a letter, please call our office to schedule the follow-up appointment.  Any Additional Special Instructions Will Be Listed Below (If Applicable).     If you need a refill on your cardiac medications before your next appointment, please call your pharmacy.   

## 2015-11-15 NOTE — Progress Notes (Signed)
Patient Care Team: Tresa Garter, MD as PCP - General (Internal Medicine)   HPI  Jeffrey [name] is a 40 y.o. male  Seen in follow-up for S ICD implanted 8/16 in conjunction with  Dr. 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  Is morbidly obese has hypertension and is status post nephrectomy  Physical blood pressure is elevated today, but he got deep toward on his way to the office and did not get a chance to take his medicine. Home blood pressure medications are in the range of 90--100 Records and Results Reviewed hospital notes  Past Medical History  Diagnosis Date  . Hypertension   . Morbid obesity ([place])   . Presumed NICM     a. 04/2014 Myoview: EF 26%, glob HK, sev glob HK, ? prior infarct;  b. Never cathed 2/2 CKD.  [name] Chronic systolic CHF (congestive heart failure) ([place])     a. EF 20-25% in 2012. b. EF 45-50% in 10/2011 with nonischemic nuc - presumed NICM. c. 12/2014 Echo: Sev depressed LV fxn, sev dil LV, mild LVH, mild MR, sev dil LA, mildly reduced RV fxn.  . Nephrolithiasis   . OSA on CPAP   . High cholesterol   . Childhood asthma   . Type II diabetes mellitus ([place])   . Anxiety   . Renal cell carcinoma ([place])     a. s/p Rt robotic assisted partial converted to radical nephrectomy on 01/2013.  . Troponin level elevated     a. 04/2014, 12/2014: felt due to CHF.  . CKD (chronic kidney disease) stage 2, GFR 60-89 ml/min   . Aspirin allergy     Past Surgical History  Procedure Laterality Date  . Robotic assited partial nephrectomy Right 01/13/2013    Procedure: ROBOTIC ASSITED PARTIAL NEPHRECTOMY CONVERTED TO ROBOTIC ASSISTED RIGHT RADICAL NEPHRECTOMY;  Surgeon: Alexis Frock, MD;  [facility];  Service: Urology;  Laterality: Right;  . Robotic assisted laparoscopic lysis of adhesion  01/13/2013    Procedure: ROBOTIC ASSISTED LAPAROSCOPIC LYSIS OF ADHESION EXTENSIVE;  Surgeon: Alexis Frock, MD;  [facility];  Service: Urology;;   . Appendectomy  07/2004  . Cardiac catheterization N/A 05/17/2015    Procedure: Right/Left Heart Cath and Coronary Angiography;  Surgeon: Jolaine Artist, MD;  Location: [place] [facility];  Service: Cardiovascular;  Laterality: N/A;  . Ep implantable device N/A 05/17/2015    Procedure: SubQ ICD Implant;  Surgeon: Will Meredith Leeds, MD;  Location: [place] [facility];  Service: Cardiovascular;  Laterality: N/A;    Current Outpatient Prescriptions  Medication Sig Dispense Refill  . allopurinol (ZYLOPRIM) 100 MG tablet Take 1 tablet (100 mg total) by mouth daily. 30 tablet 2  . atorvastatin (LIPITOR) 80 MG tablet TAKE 1 TABLET (80 MG TOTAL) BY MOUTH EVERY EVENING. 30 tablet 6  . carvedilol (COREG) 6.25 MG tablet Take 3 tablets (18.75 mg total) by mouth 2 (two) times daily with a meal. 180 tablet 3  . hydrALAZINE (APRESOLINE) 25 MG tablet Take 1 tablet (25 mg total) by mouth 3 (three) times daily. 90 tablet 3  . insulin NPH-regular Human (NOVOLIN 70/30) (70-30) 100 UNIT/ML injection Inject 34 Units into the skin 2 (two) times daily with a meal. 4 vial 2  . isosorbide mononitrate (IMDUR) 30 MG 24 hr tablet Take 1 tablet (30 mg total) by mouth daily. 30 tablet 11  . Melatonin 5 MG TABS Take 5 mg by mouth  at bedtime.    . sacubitril-valsartan (ENTRESTO) 24-26 MG Take 1 tablet by mouth 2 (two) times daily. 60 tablet 11  . spironolactone (ALDACTONE) 25 MG tablet Take 1 tablet (25 mg total) by mouth daily. 30 tablet 6  . torsemide (DEMADEX) 20 MG tablet Take 1 tablet (20 mg total) by mouth every other day. (Patient taking differently: Take 10 mg by mouth every other day. ) 60 tablet 3  . [DISCONTINUED] cetirizine (ZYRTEC ALLERGY) 10 MG tablet Take 1 tablet (10 mg total) by mouth daily. 30 tablet 1   No current facility-administered medications for this visit.    Allergies  Allergen Reactions  . Aspirin Shortness Of Breath, Itching and Rash     Burning sensation (Patient reports he tolerates  other NSAIDS)   . Bee Venom Hives and Swelling  . Lisinopril Cough  . Tomato Rash      Review of Systems negative except from HPI and PMH  Physical Exam BP 142/110 mmHg  Pulse 92  Ht 5\' 7"  (1.702 m)  Wt 312 lb (141.522 kg)  BMI 48.85 kg/m2 Well developed and well nourished in no acute distress HENT normal E scleral and icterus clear Neck Supple JVP flat; carotids brisk and full Clear to ausculation  Device pocket well healed; without hematoma or erythema.  There is no tethering  *Regular rate and rhythm, no murmurs gallops or rub Soft with active bowel sounds No clubbing cyanosis  Edema Alert and oriented, grossly normal motor and sensory function Skin Warm and Dry  ECG demonstrates sinus rhythm at 92 Interval 17/09/37 Biatrial enlargement Otherwise normal  Assessment and  Plan  Nonischemic cardiomyopathy  Congestive heart failure-chronic-systolic-class II  Morbid obesity  hypertension   ventricular tachycardia-nonsustained  ICD-subcutaneous  Normal no reprogramming   The patient's device is functioning normally. 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  Blood pressure significantly elevated but he did not take medications as most

## 2015-11-18 ENCOUNTER — Ambulatory Visit: Payer: Self-pay

## 2015-11-29 ENCOUNTER — Ambulatory Visit: Payer: Self-pay

## 2015-12-06 ENCOUNTER — Ambulatory Visit: Payer: Self-pay | Attending: Internal Medicine

## 2015-12-19 ENCOUNTER — Encounter (HOSPITAL_COMMUNITY): Payer: Self-pay

## 2015-12-24 NOTE — Progress Notes (Signed)
Patient ID: Jeffrey [name], male   DOB: [birth year], 40 y.o.   MRN: [id number]     Advanced Heart Failure Clinic Note   Patient Name: Jeffrey [name] Date of Encounter: 12/25/2015  Primary Care Provider:  Angelica Chessman, MD Primary Cardiologist:  Lizbeth Bark, MD  Primary HF Cardiologist: Bensimhon  History: Ms. 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    Admitted to [facility] in 3/16 secondary to atypical chest pain, dyspnea, and mild troponin elevation. Echo during hospitalization shows "severely reduced LV function" - EF not quantified due to poor windows. He was diuresed and was down about 7 pounds at discharge, at 289 on the hospital scale.   Admitted 05/14/15 with CP at rest. CXR looked improved from previous. CP resolved spontaneously after hospitalization and troponin was flat. R/LHC showed normal coronaries and moderately elevated filling pressures with moderately depressed cardiac output. He had been previously seen by EP and approved for ICD with chronic low EF. [employer] subcutaneous ICD was implanted 05/17/15.  He was diuresed with IV lasix 40 mg. Discharge weight 290 lb.  He returns today for follow up. Last visit torsemide cut back after mild Creatinine bump on BMET. He did NOT decrease his torsemide dose. Up 10 lbs since last visit.  Working back at [employer], not very active otherwise.  Denies SOB/PND/Orthopnea. No bendopnea. Denies DOE with stairs or hills.  Hasn't been weighing daily for past 2 weeks with broken scale. Had been ~295-300 before then. Drinks a lot of water, over 2 L. Occasional has ETOH. Smoking marijuana every now and then. Medicaid still pending.  Echo 05/07/15.  EF 20-25%, grade II diastolic dysfunction, diffuse hypokinesis, moderate LV dilation, mild LVH, normal RV size with mildly decreased  systolic function.   Echo in 2012 with EF 20-25%.  In 1/13 had nuclear study EF 45-50% suggestive of NICM.   [facility] 05/17/15 RA = 7 RV = 42/3/7 PA = 43/30 (39) PCW = 26 Fick cardiac output/index = 4.9/2.1 PVR = 2.7 WU SVR = 1651 FA sat = 95% PA sat = 65%, 65% Ao = 122/97 (109) LV = 110/31/33 1) Normal coronaries 2) Moderately elevated filling pressures with moderately depressed cardiac output  Labs (6/16): K 4.1, creatinine 1.52, BNP 133 Labs (06/16/15): K 3.9, creatinine 1.9 Labs (06/28/2015) K 4.1 Creatinine 1.59  Labs (09/10/2015) K 3.9 Creatinine 1.62   Past Medical History   Past Medical History  Diagnosis Date  . Hypertension   . Morbid obesity ([place])   . Presumed NICM     a. 04/2014 Myoview: EF 26%, glob HK, sev glob HK, ? prior infarct;  b. Never cathed 2/2 CKD.  [name] Chronic systolic CHF (congestive heart failure) ([place])     a. EF 20-25% in 2012. b. EF 45-50% in 10/2011 with nonischemic nuc - presumed NICM. c. 12/2014 Echo: Sev depressed LV fxn, sev dil LV, mild LVH, mild MR, sev dil LA, mildly reduced RV fxn.  . Nephrolithiasis   . OSA on CPAP   . High cholesterol   . Childhood asthma   . Type II diabetes mellitus ([place])   . Anxiety   . Renal cell carcinoma ([place])     a. s/p Rt robotic assisted partial converted to radical nephrectomy on 01/2013.  . Troponin level elevated     a. 04/2014, 12/2014:  felt due to CHF.  . CKD (chronic kidney disease) stage 2, GFR 60-89 ml/min   . Aspirin allergy    Past Surgical History  Procedure Laterality Date  . Robotic assited partial nephrectomy Right 01/13/2013    Procedure: ROBOTIC ASSITED PARTIAL NEPHRECTOMY CONVERTED TO ROBOTIC ASSISTED RIGHT RADICAL NEPHRECTOMY;  Surgeon: Alexis Frock, MD;  Location: [facility];  Service: Urology;  Laterality: Right;  . Robotic assisted laparoscopic lysis of adhesion  01/13/2013    Procedure: ROBOTIC ASSISTED LAPAROSCOPIC LYSIS OF ADHESION EXTENSIVE;  Surgeon: Alexis Frock, MD;  Location: [facility];   Service: Urology;;  . Appendectomy  07/2004  . Cardiac catheterization N/A 05/17/2015    Procedure: Right/Left Heart Cath and Coronary Angiography;  Surgeon: Jolaine Artist, MD;  Location: [place] [facility];  Service: Cardiovascular;  Laterality: N/A;  . Ep implantable device N/A 05/17/2015    Procedure: SubQ ICD Implant;  Surgeon: Will Meredith Leeds, MD;  Location: [place] [facility];  Service: Cardiovascular;  Laterality: N/A;    Allergies  Allergies  Allergen Reactions  . Aspirin Shortness Of Breath, Itching and Rash     Burning sensation (Patient reports he tolerates other NSAIDS)   . Bee Venom Hives and Swelling  . Lisinopril Cough  . Tomato Rash     Home Medications Current Outpatient Prescriptions  Medication Sig Dispense Refill  . allopurinol (ZYLOPRIM) 100 MG tablet Take 1 tablet (100 mg total) by mouth daily. 30 tablet 2  . atorvastatin (LIPITOR) 80 MG tablet TAKE 1 TABLET (80 MG TOTAL) BY MOUTH EVERY EVENING. 30 tablet 6  . carvedilol (COREG) 6.25 MG tablet Take 3 tablets (18.75 mg total) by mouth 2 (two) times daily with a meal. 180 tablet 3  . hydrALAZINE (APRESOLINE) 25 MG tablet Take 1 tablet (25 mg total) by mouth 3 (three) times daily. 90 tablet 3  . insulin NPH-regular Human (NOVOLIN 70/30) (70-30) 100 UNIT/ML injection Inject 34 Units into the skin 2 (two) times daily with a meal. 4 vial 2  . isosorbide mononitrate (IMDUR) 30 MG 24 hr tablet Take 1 tablet (30 mg total) by mouth daily. 30 tablet 11  . Melatonin 5 MG TABS Take 5 mg by mouth at bedtime as needed.     . sacubitril-valsartan (ENTRESTO) 24-26 MG Take 1 tablet by mouth 2 (two) times daily. 60 tablet 11  . spironolactone (ALDACTONE) 25 MG tablet Take 1 tablet (25 mg total) by mouth daily. 30 tablet 6  . torsemide (DEMADEX) 20 MG tablet Take 20 mg by mouth daily.    . [DISCONTINUED] cetirizine (ZYRTEC ALLERGY) 10 MG tablet Take 1 tablet (10 mg total) by mouth daily. 30 tablet 1   No current  facility-administered medications for this encounter.   Review of Systems All other systems reviewed and are otherwise negative except as noted above.  Physical Exam  VS:  BP 134/100 mmHg  Pulse 95  Wt 311 lb 12.8 oz (141.432 kg)  SpO2 98% , Body mass index is 48.82 kg/(m^2).   Wt Readings from Last 3 Encounters:  12/25/15 311 lb 12.8 oz (141.432 kg)  11/15/15 312 lb (141.522 kg)  11/08/15 301 lb (136.533 kg)    GEN: WDWN,  HEENT: normal. Neck: Supple, JVP 6-7  carotid bruits, or masses. Cardiac: RRR, no murmurs, rubs, or gallops. No clubbing, cyanosis, or edema.  Radials/DP/PT 2+ and equal bilaterally.  Respiratory: CTAB, normal effort GI: Obese, soft, NT, ND, no HSM. No bruits or masses. +BS MS: no deformity or  atrophy. R and LLE no edema.   Skin: warm and dry, no rash. Neuro:  Strength and sensation are intact. Psych: Normal affect.  Assessment & Plan  1.  Chronic systolic congestive heart failure: s/p [employer] subcutaneous ICD implant 05/17/15.  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 Echo 05/07/15 with 20-25% EF.  CMP may be due to HTN.   NYHA class II symptoms. Volume status stable despite weight gain.   - Continue torsemide 20 mg daily. Check BMET today.  - Continue entresto 24-26 mg twice a day. Has been approved for [employer] patient assistance. Needs to call company to get set up, didn't answer phone the first time.  - Increase coreg 25 mg bid.  - Continue hydralazine to 25 mg TID and continue Imdur 30 mg daily.  -Continue spironolactone to 25 mg daily  -Congratulated on medication compliance.  2. Hypertension: Controlled.  - Med changes as above.   3. CKD stage III: Follow creatinine closely. Check BMET today  4. OSA: Using CPAP from time to time. Encouraged to use daily.  5. Tobacco use: - Congratulated on continued abstinence. 6. Gout: Continue allopurinol 200 daily for prophylaxis.  7. DM2: On insulin. Following at [place].  8. R  knee pain.: resolved.   9. ? Situational depression- HF SW for coping mechanisms.  10.Renal Cell Carcinoma - S/P nephrectomy 2014.  [place].  - No further.  Follow up with [place] as needed.  12. Obesity - Encouraged to limit portions and increase activity as able.    Check BMET today. Will increase coreg to 25 mg BID. Will hold off on Entresto increase with recent Creatinine elevation.   Follow up 2 months with Echo seeing Dr. [name]. On good meds.   Shirley Friar, PA-C  10:05 AM

## 2015-12-25 ENCOUNTER — Ambulatory Visit (HOSPITAL_COMMUNITY)
Admission: RE | Admit: 2015-12-25 | Discharge: 2015-12-25 | Disposition: A | Discharge status: Home / Self Care | Payer: Self-pay | Source: Ambulatory Visit | Attending: Cardiology | Admitting: Cardiology

## 2015-12-25 VITALS — BP 134/100 | HR 95 | Wt 311.8 lb

## 2015-12-25 DIAGNOSIS — Z87442 Personal history of urinary calculi: Secondary | ICD-10-CM | POA: Insufficient documentation

## 2015-12-25 DIAGNOSIS — N183 Chronic kidney disease, stage 3 unspecified: Secondary | ICD-10-CM

## 2015-12-25 DIAGNOSIS — I428 Other cardiomyopathies: Secondary | ICD-10-CM | POA: Insufficient documentation

## 2015-12-25 DIAGNOSIS — M109 Gout, unspecified: Secondary | ICD-10-CM | POA: Insufficient documentation

## 2015-12-25 DIAGNOSIS — Z888 Allergy status to other drugs, medicaments and biological substances status: Secondary | ICD-10-CM | POA: Insufficient documentation

## 2015-12-25 DIAGNOSIS — I10 Essential (primary) hypertension: Secondary | ICD-10-CM

## 2015-12-25 DIAGNOSIS — G4733 Obstructive sleep apnea (adult) (pediatric): Secondary | ICD-10-CM | POA: Insufficient documentation

## 2015-12-25 DIAGNOSIS — I5022 Chronic systolic (congestive) heart failure: Secondary | ICD-10-CM | POA: Insufficient documentation

## 2015-12-25 DIAGNOSIS — Z95810 Presence of automatic (implantable) cardiac defibrillator: Secondary | ICD-10-CM | POA: Insufficient documentation

## 2015-12-25 DIAGNOSIS — I130 Hypertensive heart and chronic kidney disease with heart failure and stage 1 through stage 4 chronic kidney disease, or unspecified chronic kidney disease: Secondary | ICD-10-CM | POA: Insufficient documentation

## 2015-12-25 DIAGNOSIS — M10069 Idiopathic gout, unspecified knee: Secondary | ICD-10-CM

## 2015-12-25 DIAGNOSIS — Z87891 Personal history of nicotine dependence: Secondary | ICD-10-CM | POA: Insufficient documentation

## 2015-12-25 DIAGNOSIS — Z794 Long term (current) use of insulin: Secondary | ICD-10-CM | POA: Insufficient documentation

## 2015-12-25 DIAGNOSIS — E7800 Pure hypercholesterolemia, unspecified: Secondary | ICD-10-CM | POA: Insufficient documentation

## 2015-12-25 DIAGNOSIS — Z886 Allergy status to analgesic agent status: Secondary | ICD-10-CM | POA: Insufficient documentation

## 2015-12-25 DIAGNOSIS — E1122 Type 2 diabetes mellitus with diabetic chronic kidney disease: Secondary | ICD-10-CM | POA: Insufficient documentation

## 2015-12-25 DIAGNOSIS — Z85528 Personal history of other malignant neoplasm of kidney: Secondary | ICD-10-CM | POA: Insufficient documentation

## 2015-12-25 DIAGNOSIS — I5023 Acute on chronic systolic (congestive) heart failure: Secondary | ICD-10-CM

## 2015-12-25 DIAGNOSIS — Z79899 Other long term (current) drug therapy: Secondary | ICD-10-CM | POA: Insufficient documentation

## 2015-12-25 DIAGNOSIS — Z905 Acquired absence of kidney: Secondary | ICD-10-CM | POA: Insufficient documentation

## 2015-12-25 DIAGNOSIS — Z6841 Body Mass Index (BMI) 40.0 and over, adult: Secondary | ICD-10-CM | POA: Insufficient documentation

## 2015-12-25 LAB — BASIC METABOLIC PANEL
ANION GAP: 9 (ref 5–15)
BUN: 20 mg/dL (ref 6–20)
CO2: 19 mmol/L — ABNORMAL LOW (ref 22–32)
Calcium: 9.1 mg/dL (ref 8.9–10.3)
Chloride: 111 mmol/L (ref 101–111)
Creatinine, Ser: 1.7 mg/dL — ABNORMAL HIGH (ref 0.61–1.24)
GFR calc Af Amer: 56 mL/min — ABNORMAL LOW (ref >60)
GFR, EST NON AFRICAN AMERICAN: 48 mL/min — AB (ref >60)
GLUCOSE: 143 mg/dL — AB (ref 65–99)
POTASSIUM: 4.3 mmol/L (ref 3.5–5.1)
SODIUM: 139 mmol/L (ref 135–145)

## 2015-12-25 LAB — BRAIN NATRIURETIC PEPTIDE: B NATRIURETIC PEPTIDE 5: 756.3 pg/mL — AB (ref 0.0–100.0)

## 2015-12-25 MED ORDER — CARVEDILOL 25 MG PO TABS: 25.0000 mg | ORAL_TABLET | Freq: Two times a day (BID) | ORAL | Status: DC

## 2015-12-25 MED ORDER — HYDRALAZINE HCL 25 MG PO TABS: 25.0000 mg | ORAL_TABLET | Freq: Three times a day (TID) | ORAL | Status: DC

## 2015-12-25 MED ORDER — ATORVASTATIN CALCIUM 80 MG PO TABS: ORAL_TABLET | ORAL | Status: DC

## 2015-12-25 MED ORDER — TORSEMIDE 20 MG PO TABS: 20.0000 mg | ORAL_TABLET | Freq: Every day | ORAL | Status: DC

## 2015-12-25 MED ORDER — ISOSORBIDE MONONITRATE ER 30 MG PO TB24: 30.0000 mg | ORAL_TABLET | Freq: Every day | ORAL | Status: DC

## 2015-12-25 MED ORDER — SPIRONOLACTONE 25 MG PO TABS: 25.0000 mg | ORAL_TABLET | Freq: Every day | ORAL | Status: DC

## 2015-12-25 MED FILL — ?CARVEDILOL 25 MG TABLET: 25 | 30 days supply | Qty: 60 | Fill #0

## 2015-12-25 MED FILL — TORSEMIDE 20 MG TABLET: 20 | 30 days supply | Qty: 30 | Fill #0

## 2015-12-25 MED FILL — hydrALAZINE HCL 25 MG TABS: 25 | 30 days supply | Qty: 90 | Fill #0

## 2015-12-25 MED FILL — ISOSORBIDE MN ER 30 MG TAB: 30 | 30 days supply | Qty: 30 | Fill #0

## 2015-12-25 MED FILL — ATORVASTATIN 80 MG TABLET: 80 | 30 days supply | Qty: 30 | Fill #0

## 2015-12-25 MED FILL — ?SPIRONOLACTONE 25 MG TABLE: 25 | 30 days supply | Qty: 30 | Fill #0

## 2015-12-25 NOTE — Patient Instructions (Signed)
INCREASE Coreg to 25 mg, one tab twice a day  Labs today  Your physician recommends that you schedule a follow-up appointment in: 2 months with Dr.Bensimhon  Your physician has requested that you have an echocardiogram. Echocardiography is a painless test that uses sound waves to create images of your heart. It provides your doctor with information about the size and shape of your heart and how well your heart's chambers and valves are working. This procedure takes approximately one hour. There are no restrictions for this procedure.   Do the following things EVERYDAY: 1) Weigh yourself in the morning before breakfast. Write it down and keep it in a log. 2) Take your medicines as prescribed 3) Eat low salt foods-Limit salt (sodium) to 2000 mg per day.  4) Stay as active as you can everyday 5) Limit all fluids for the day to less than 2 liters 6)

## 2015-12-25 NOTE — Progress Notes (Signed)
Advanced Heart Failure Medication Review by a Pharmacist  Does the patient  feel that his/her medications are working for him/her?  yes  Has the patient been experiencing any side effects to the medications prescribed?  no  Does the patient measure his/her own blood pressure or blood glucose at home?  no   Does the patient have any problems obtaining medications due to transportation or finances?   no  Understanding of regimen: good Understanding of indications: good Potential of compliance: fair Patient understands to avoid NSAIDs. Patient understands to avoid decongestants.  Issues to address at subsequent visits: None   Pharmacist comments: 40 YO pleasant male presenting to HF clinic for followup.  Pt denies SE to his current regimen or problems obtaining his medications. He does request refills to his HF meds sent to [facility].  Pt states that he is taking Torsemide 20 mg daily.     Time with patient: 5 min  Preparation and documentation time: 5 min  Total time: 10 min

## 2016-01-14 ENCOUNTER — Inpatient Hospital Stay (HOSPITAL_COMMUNITY)
Admission: EM | Admit: 2016-01-14 | Discharge: 2016-01-16 | DRG: 291 | Disposition: A | Discharge status: Home / Self Care | Payer: Self-pay | Attending: Family Medicine | Admitting: Family Medicine

## 2016-01-14 ENCOUNTER — Encounter (HOSPITAL_COMMUNITY): Payer: Self-pay | Admitting: Emergency Medicine

## 2016-01-14 ENCOUNTER — Emergency Department (HOSPITAL_COMMUNITY): Payer: Self-pay

## 2016-01-14 DIAGNOSIS — I429 Cardiomyopathy, unspecified: Secondary | ICD-10-CM | POA: Diagnosis present

## 2016-01-14 DIAGNOSIS — M109 Gout, unspecified: Secondary | ICD-10-CM | POA: Diagnosis present

## 2016-01-14 DIAGNOSIS — Z87891 Personal history of nicotine dependence: Secondary | ICD-10-CM

## 2016-01-14 DIAGNOSIS — Z91030 Bee allergy status: Secondary | ICD-10-CM

## 2016-01-14 DIAGNOSIS — I10 Essential (primary) hypertension: Secondary | ICD-10-CM

## 2016-01-14 DIAGNOSIS — N183 Chronic kidney disease, stage 3 unspecified: Secondary | ICD-10-CM | POA: Diagnosis present

## 2016-01-14 DIAGNOSIS — Z79899 Other long term (current) drug therapy: Secondary | ICD-10-CM

## 2016-01-14 DIAGNOSIS — E785 Hyperlipidemia, unspecified: Secondary | ICD-10-CM | POA: Diagnosis present

## 2016-01-14 DIAGNOSIS — I130 Hypertensive heart and chronic kidney disease with heart failure and stage 1 through stage 4 chronic kidney disease, or unspecified chronic kidney disease: Principal | ICD-10-CM | POA: Diagnosis present

## 2016-01-14 DIAGNOSIS — I509 Heart failure, unspecified: Secondary | ICD-10-CM

## 2016-01-14 DIAGNOSIS — E119 Type 2 diabetes mellitus without complications: Secondary | ICD-10-CM

## 2016-01-14 DIAGNOSIS — E1122 Type 2 diabetes mellitus with diabetic chronic kidney disease: Secondary | ICD-10-CM | POA: Diagnosis present

## 2016-01-14 DIAGNOSIS — E7800 Pure hypercholesterolemia, unspecified: Secondary | ICD-10-CM | POA: Diagnosis present

## 2016-01-14 DIAGNOSIS — Z720 Tobacco use: Secondary | ICD-10-CM

## 2016-01-14 DIAGNOSIS — Z886 Allergy status to analgesic agent status: Secondary | ICD-10-CM

## 2016-01-14 DIAGNOSIS — Z888 Allergy status to other drugs, medicaments and biological substances status: Secondary | ICD-10-CM

## 2016-01-14 DIAGNOSIS — F319 Bipolar disorder, unspecified: Secondary | ICD-10-CM | POA: Diagnosis present

## 2016-01-14 DIAGNOSIS — Z6841 Body Mass Index (BMI) 40.0 and over, adult: Secondary | ICD-10-CM

## 2016-01-14 DIAGNOSIS — E1121 Type 2 diabetes mellitus with diabetic nephropathy: Secondary | ICD-10-CM | POA: Diagnosis present

## 2016-01-14 DIAGNOSIS — K921 Melena: Secondary | ICD-10-CM | POA: Diagnosis present

## 2016-01-14 DIAGNOSIS — F419 Anxiety disorder, unspecified: Secondary | ICD-10-CM | POA: Diagnosis present

## 2016-01-14 DIAGNOSIS — E1159 Type 2 diabetes mellitus with other circulatory complications: Secondary | ICD-10-CM | POA: Diagnosis present

## 2016-01-14 DIAGNOSIS — Z85528 Personal history of other malignant neoplasm of kidney: Secondary | ICD-10-CM

## 2016-01-14 DIAGNOSIS — Z905 Acquired absence of kidney: Secondary | ICD-10-CM

## 2016-01-14 DIAGNOSIS — R0602 Shortness of breath: Secondary | ICD-10-CM

## 2016-01-14 DIAGNOSIS — F4321 Adjustment disorder with depressed mood: Secondary | ICD-10-CM | POA: Diagnosis present

## 2016-01-14 DIAGNOSIS — I255 Ischemic cardiomyopathy: Secondary | ICD-10-CM | POA: Diagnosis present

## 2016-01-14 DIAGNOSIS — Z95810 Presence of automatic (implantable) cardiac defibrillator: Secondary | ICD-10-CM

## 2016-01-14 DIAGNOSIS — I5043 Acute on chronic combined systolic (congestive) and diastolic (congestive) heart failure: Secondary | ICD-10-CM | POA: Diagnosis present

## 2016-01-14 DIAGNOSIS — G4733 Obstructive sleep apnea (adult) (pediatric): Secondary | ICD-10-CM | POA: Diagnosis present

## 2016-01-14 DIAGNOSIS — I5023 Acute on chronic systolic (congestive) heart failure: Secondary | ICD-10-CM | POA: Diagnosis present

## 2016-01-14 DIAGNOSIS — Z794 Long term (current) use of insulin: Secondary | ICD-10-CM

## 2016-01-14 DIAGNOSIS — Z91018 Allergy to other foods: Secondary | ICD-10-CM

## 2016-01-14 LAB — BASIC METABOLIC PANEL
ANION GAP: 10 (ref 5–15)
BUN: 12 mg/dL (ref 6–20)
CHLORIDE: 109 mmol/L (ref 101–111)
CO2: 20 mmol/L — AB (ref 22–32)
CREATININE: 1.46 mg/dL — AB (ref 0.61–1.24)
Calcium: 9.1 mg/dL (ref 8.9–10.3)
GFR calc non Af Amer: 58 mL/min — ABNORMAL LOW (ref >60)
Glucose, Bld: 118 mg/dL — ABNORMAL HIGH (ref 65–99)
POTASSIUM: 4.3 mmol/L (ref 3.5–5.1)
SODIUM: 139 mmol/L (ref 135–145)

## 2016-01-14 LAB — I-STAT TROPONIN, ED: Troponin i, poc: 0.03 ng/mL (ref 0.00–0.08)

## 2016-01-14 LAB — CBC
HEMATOCRIT: 40.1 % (ref 39.0–52.0)
HEMOGLOBIN: 12.8 g/dL — AB (ref 13.0–17.0)
MCH: 23.1 pg — AB (ref 26.0–34.0)
MCHC: 31.9 g/dL (ref 30.0–36.0)
MCV: 72.5 fL — AB (ref 78.0–100.0)
PLATELETS: 278 10*3/uL (ref 150–400)
RBC: 5.53 MIL/uL (ref 4.22–5.81)
RDW: 18.1 % — ABNORMAL HIGH (ref 11.5–15.5)
WBC: 8.6 10*3/uL (ref 4.0–10.5)

## 2016-01-14 LAB — CBG MONITORING, ED: GLUCOSE-CAPILLARY: 99 mg/dL (ref 65–99)

## 2016-01-14 LAB — BRAIN NATRIURETIC PEPTIDE: B NATRIURETIC PEPTIDE 5: 988.6 pg/mL — AB (ref 0.0–100.0)

## 2016-01-14 MED ORDER — FUROSEMIDE 10 MG/ML IJ SOLN
80.0000 mg | Freq: Once | INTRAMUSCULAR | Status: AC
  Administered 2016-01-14: 80 mg via INTRAVENOUS
  Filled 2016-01-14: qty 8

## 2016-01-14 NOTE — ED Notes (Signed)
Pt states while laying down last night around 0100 he became very short of breath and started having a tightness in his chest.

## 2016-01-14 NOTE — ED Provider Notes (Signed)
CSN: [id number]     Arrival date & time 01/14/16  1419 History   First MD Initiated Contact with Patient 01/14/16 2159     Chief Complaint  Patient presents with  . Chest Pain     (Consider location/radiation/quality/duration/timing/severity/associated sxs/prior Treatment) HPI EFRAM ROATH is a 40 y.o. male with PMH significant for Hypertension, CHF, diabetes, CK D, renal cell carcinoma status post radical nephrectomy 2014 who presents with sudden onset of constant, moderate, worsening shortness of breath. Patient states he was lying flat approximately 1 AM this morning when he became extremely short of breath, and describes it "feeling like I can't catch my breath". He states that this has happened before when he had a CHF exacerbation. No modifying factors. Associated symptoms include chest tightness which is now resolved. He denies fever, dizziness, syncope, cough, nausea, vomiting, diarrhea, abdominal pain, urinary symptoms, lower extremity edema or URI symptoms. He reports he has not taken any of his medications today.  Cardiologist: Dr. Caryl Comes PCP: [facility]   Past Medical History  Diagnosis Date  . Hypertension   . Morbid obesity ([place])   . Presumed NICM     a. 04/2014 Myoview: EF 26%, glob HK, sev glob HK, ? prior infarct;  b. Never cathed 2/2 CKD.  [name] Chronic systolic CHF (congestive heart failure) ([place])     a. EF 20-25% in 2012. b. EF 45-50% in 10/2011 with nonischemic nuc - presumed NICM. c. 12/2014 Echo: Sev depressed LV fxn, sev dil LV, mild LVH, mild MR, sev dil LA, mildly reduced RV fxn.  . Nephrolithiasis   . OSA on CPAP   . High cholesterol   . Childhood asthma   . Type II diabetes mellitus ([place])   . Anxiety   . Renal cell carcinoma ([place])     a. s/p Rt robotic assisted partial converted to radical nephrectomy on 01/2013.  . Troponin level elevated     a. 04/2014, 12/2014: felt due to CHF.  . CKD (chronic kidney disease) stage 2, GFR 60-89 ml/min   . Aspirin allergy    Past  Surgical History  Procedure Laterality Date  . Robotic assited partial nephrectomy Right 01/13/2013    Procedure: ROBOTIC ASSITED PARTIAL NEPHRECTOMY CONVERTED TO ROBOTIC ASSISTED RIGHT RADICAL NEPHRECTOMY;  Surgeon: Alexis Frock, MD;  Location: [facility];  Service: Urology;  Laterality: Right;  . Robotic assisted laparoscopic lysis of adhesion  01/13/2013    Procedure: ROBOTIC ASSISTED LAPAROSCOPIC LYSIS OF ADHESION EXTENSIVE;  Surgeon: Alexis Frock, MD;  [facility];  Service: Urology;;  . Appendectomy  07/2004  . Cardiac catheterization N/A 05/17/2015    Procedure: Right/Left Heart Cath and Coronary Angiography;  Surgeon: Jolaine Artist, MD;  Location: [place] [facility];  Service: Cardiovascular;  Laterality: N/A;  . Ep implantable device N/A 05/17/2015    Procedure: SubQ ICD Implant;  Surgeon: Will Meredith Leeds, MD;  Location: [place] [facility];  Service: Cardiovascular;  Laterality: N/A;   Family History  Problem Relation Age of Onset  . Heart attack Neg Hx   . Stroke Neg Hx   . Hypertension Mother   . Diabetes Maternal Aunt    Social History  Substance Use Topics  . Smoking status: Former Smoker -- 0.25 packs/day for 22 years    Quit date: 04/16/2015  . Smokeless tobacco: Never Used  . Alcohol Use: Yes     Comment: occ     Review of Systems All other systems negative unless otherwise stated in HPI  Allergies  Aspirin; Bee venom; Lisinopril; and Tomato  Home Medications   Prior to Admission medications   Medication Sig Start Date End Date Taking? Authorizing Provider  allopurinol (ZYLOPRIM) 100 MG tablet Take 1 tablet (100 mg total) by mouth daily. 05/21/15  Yes Arnoldo Morale, MD  atorvastatin (LIPITOR) 80 MG tablet TAKE 1 TABLET (80 MG TOTAL) BY MOUTH EVERY EVENING. 12/25/15  Yes Shirley Friar, PA-C  carvedilol (COREG) 25 MG tablet Take 1 tablet (25 mg total) by mouth 2 (two) times daily with a meal. 12/25/15  Yes Shirley Friar, PA-C   hydrALAZINE (APRESOLINE) 25 MG tablet Take 1 tablet (25 mg total) by mouth 3 (three) times daily. 12/25/15  Yes Shirley Friar, PA-C  insulin NPH-regular Human (NOVOLIN 70/30) (70-30) 100 UNIT/ML injection Inject 34 Units into the skin 2 (two) times daily with a meal. 05/21/15  Yes Arnoldo Morale, MD  isosorbide mononitrate (IMDUR) 30 MG 24 hr tablet Take 1 tablet (30 mg total) by mouth daily. 12/25/15  Yes Shirley Friar, PA-C  Melatonin 5 MG TABS Take 5 mg by mouth at bedtime as needed.    Yes Historical Provider, MD  sacubitril-valsartan (ENTRESTO) 24-26 MG Take 1 tablet by mouth 2 (two) times daily. 09/10/15  Yes Amy D Clegg, NP  spironolactone (ALDACTONE) 25 MG tablet Take 1 tablet (25 mg total) by mouth daily. 12/25/15  Yes Shirley Friar, PA-C  torsemide (DEMADEX) 20 MG tablet Take 1 tablet (20 mg total) by mouth daily. 12/25/15  Yes Satira Mccallum Tillery, PA-C   BP 140/121 mmHg  Pulse 83  Temp(Src) 98.6 F (37 C) (Oral)  Resp 22  Ht 5\' 9"  (1.753 m)  Wt 141.522 kg  BMI 46.05 kg/m2  SpO2 99% Physical Exam  Constitutional: He is oriented to person, place, and time. He appears well-developed and well-nourished.  Non-toxic appearance. He does not have a sickly appearance. He does not appear ill.  Morbidly obese male.  HENT:  Head: Normocephalic and atraumatic.  Mouth/Throat: Oropharynx is clear and moist.  Eyes: Conjunctivae are normal. Pupils are equal, round, and reactive to light.  Neck: Normal range of motion. Neck supple.  Cardiovascular: Normal rate, regular rhythm and normal heart sounds.   No murmur heard. No lower extremity edema bilaterally.   Pulmonary/Chest: Effort normal and breath sounds normal. No accessory muscle usage or stridor. No respiratory distress. He has no wheezes. He has no rhonchi. He has no rales.  Abdominal: Soft. Bowel sounds are normal. He exhibits no distension. There is no tenderness.  Musculoskeletal: Normal range of motion.   Lymphadenopathy:    He has no cervical adenopathy.  Neurological: He is alert and oriented to person, place, and time.  Speech clear without dysarthria.  Skin: Skin is warm and dry.  Psychiatric: He has a normal mood and affect. His behavior is normal.    ED Course  Procedures (including critical care time) Labs Review Labs Reviewed  BASIC METABOLIC PANEL - Abnormal; Notable for the following:    CO2 20 (*)    Glucose, Bld 118 (*)    Creatinine, Ser 1.46 (*)    GFR calc non Af Amer 58 (*)    All other components within normal limits  CBC - Abnormal; Notable for the following:    Hemoglobin 12.8 (*)    MCV 72.5 (*)    MCH 23.1 (*)    RDW 18.1 (*)    All other components within normal limits  BRAIN NATRIURETIC PEPTIDE -  Abnormal; Notable for the following:    B Natriuretic Peptide 988.6 (*)    All other components within normal limits  I-STAT TROPOININ, ED  CBG MONITORING, ED    Imaging Review Dg Chest 2 View  01/14/2016  CLINICAL DATA:  40 year old male with chest pain and shortness of breath since this morning. Initial encounter. EXAM: CHEST  2 VIEW COMPARISON:  None available. FINDINGS: Left anterior chest wall AICD type device. Mild cardiomegaly. Other mediastinal contours are within normal limits. Visualized tracheal air column is within normal limits. Diffuse increased interstitial opacity. No pneumothorax or pleural effusion. No confluent opacity. No acute osseous abnormality identified. IMPRESSION: 1. Diffuse increased interstitial opacity suspicious for interstitial edema, or possibly viral/atypical respiratory infection. 2. No pleural effusion.  Mild cardiomegaly. Electronically Signed   By: Genevie Ann M.D.   On: 01/14/2016 15:56   I have personally reviewed and evaluated these images and lab results as part of my medical decision-making.   EKG Interpretation   Date/Time:  Tuesday January 14 2016 23:45:37 EDT Ventricular Rate:  82 PR Interval:  170 QRS Duration: 98 QT  Interval:  396 QTC Calculation: 462 R Axis:   124 Text Interpretation:  Sinus rhythm LAE, consider biatrial enlargement  Right axis deviation Borderline low voltage, extremity leads TWI in  lateral leads, unchanged from prior EKg  Confirmed by LIU MD, DANA [phone])  on 01/14/2016 11:51:50 PM      MDM   Final diagnoses:  Shortness of breath  Acute on chronic congestive heart failure, unspecified congestive heart failure type [facility])   Patient presents with CHF exacerbation.  Sudden onset SOB, PND approximately 1 AM.  Chest tightness has now resolved.  He is hypertensive, otherwise vitals normal.  On exam, heart RRR, lungs CTAB, abdomen soft and benign.  No lower extremity bilateral edema.  EKG without acute changes, SR, LAE. Labs remarkable for BNP 988; otherwise, without acute abnormalities.  CXR shows diffuse interstitial edema.  Patient given 80 mg IV Lasix in ED.  Plan to admit to medicine, Dr. Tamala Julian, for further diuresis.  Case has been discussed with Dr. Oleta Mouse who agrees with the above plan for admission.      Gloriann Loan, PA-C 01/15/16 0031  Forde Dandy, MD 01/15/16 [phone]

## 2016-01-15 ENCOUNTER — Encounter (HOSPITAL_COMMUNITY): Payer: Self-pay | Admitting: Urology

## 2016-01-15 DIAGNOSIS — I10 Essential (primary) hypertension: Secondary | ICD-10-CM

## 2016-01-15 DIAGNOSIS — I509 Heart failure, unspecified: Secondary | ICD-10-CM

## 2016-01-15 DIAGNOSIS — N183 Chronic kidney disease, stage 3 (moderate): Secondary | ICD-10-CM

## 2016-01-15 DIAGNOSIS — E118 Type 2 diabetes mellitus with unspecified complications: Secondary | ICD-10-CM

## 2016-01-15 DIAGNOSIS — M109 Gout, unspecified: Secondary | ICD-10-CM

## 2016-01-15 DIAGNOSIS — Z794 Long term (current) use of insulin: Secondary | ICD-10-CM

## 2016-01-15 LAB — GLUCOSE, CAPILLARY
GLUCOSE-CAPILLARY: 111 mg/dL — AB (ref 65–99)
GLUCOSE-CAPILLARY: 99 mg/dL (ref 65–99)
Glucose-Capillary: 124 mg/dL — ABNORMAL HIGH (ref 65–99)
Glucose-Capillary: 87 mg/dL (ref 65–99)

## 2016-01-15 MED ORDER — SODIUM CHLORIDE 0.9% FLUSH
3.0000 mL | Freq: Two times a day (BID) | INTRAVENOUS | Status: DC
  Administered 2016-01-15 – 2016-01-16 (×4): 3 mL via INTRAVENOUS

## 2016-01-15 MED ORDER — ENOXAPARIN SODIUM 80 MG/0.8ML ~~LOC~~ SOLN
70.0000 mg | SUBCUTANEOUS | Status: DC
  Administered 2016-01-15 – 2016-01-16 (×2): 70 mg via SUBCUTANEOUS
  Filled 2016-01-15 (×2): qty 0.8

## 2016-01-15 MED ORDER — SODIUM CHLORIDE 0.9 % IV SOLN: 250.0000 mL | INTRAVENOUS | Status: DC | PRN

## 2016-01-15 MED ORDER — HYDRALAZINE HCL 25 MG PO TABS
25.0000 mg | ORAL_TABLET | Freq: Three times a day (TID) | ORAL | Status: DC
  Filled 2016-01-15: qty 1

## 2016-01-15 MED ORDER — ISOSORBIDE MONONITRATE ER 30 MG PO TB24
30.0000 mg | ORAL_TABLET | Freq: Every day | ORAL | Status: DC
Start: 2016-01-15 — End: 2016-01-16
  Administered 2016-01-15 – 2016-01-16 (×2): 30 mg via ORAL
  Filled 2016-01-15 (×2): qty 1

## 2016-01-15 MED ORDER — ATORVASTATIN CALCIUM 80 MG PO TABS
80.0000 mg | ORAL_TABLET | Freq: Every day | ORAL | Status: DC
  Filled 2016-01-15: qty 1

## 2016-01-15 MED ORDER — ZOLPIDEM TARTRATE 5 MG PO TABS
5.0000 mg | ORAL_TABLET | Freq: Once | ORAL | Status: AC
  Administered 2016-01-15: 5 mg via ORAL
  Filled 2016-01-15: qty 1

## 2016-01-15 MED ORDER — SPIRONOLACTONE 25 MG PO TABS
25.0000 mg | ORAL_TABLET | Freq: Every day | ORAL | Status: DC
  Administered 2016-01-15 – 2016-01-16 (×2): 25 mg via ORAL
  Filled 2016-01-15 (×2): qty 1

## 2016-01-15 MED ORDER — SACUBITRIL-VALSARTAN 24-26 MG PO TABS
1.0000 | ORAL_TABLET | Freq: Two times a day (BID) | ORAL | Status: DC
  Administered 2016-01-15: 1 via ORAL
  Filled 2016-01-15 (×2): qty 1

## 2016-01-15 MED ORDER — SODIUM CHLORIDE 0.9% FLUSH: 3.0000 mL | INTRAVENOUS | Status: DC | PRN

## 2016-01-15 MED ORDER — ONDANSETRON HCL 4 MG/2ML IJ SOLN: 4.0000 mg | Freq: Four times a day (QID) | INTRAMUSCULAR | Status: DC | PRN

## 2016-01-15 MED ORDER — ALLOPURINOL 100 MG PO TABS
100.0000 mg | ORAL_TABLET | Freq: Every day | ORAL | Status: DC
  Administered 2016-01-15 – 2016-01-16 (×2): 100 mg via ORAL
  Filled 2016-01-15 (×2): qty 1

## 2016-01-15 MED ORDER — CARVEDILOL 25 MG PO TABS
25.0000 mg | ORAL_TABLET | Freq: Two times a day (BID) | ORAL | Status: DC
  Administered 2016-01-15 – 2016-01-16 (×4): 25 mg via ORAL
  Filled 2016-01-15 (×4): qty 1

## 2016-01-15 MED ORDER — CARVEDILOL 25 MG PO TABS
25.0000 mg | ORAL_TABLET | Freq: Two times a day (BID) | ORAL | Status: DC
  Filled 2016-01-15: qty 1

## 2016-01-15 MED ORDER — FUROSEMIDE 10 MG/ML IJ SOLN
40.0000 mg | Freq: Two times a day (BID) | INTRAMUSCULAR | Status: DC
  Administered 2016-01-15 – 2016-01-16 (×3): 40 mg via INTRAVENOUS
  Filled 2016-01-15 (×3): qty 4

## 2016-01-15 MED ORDER — ACETAMINOPHEN 325 MG PO TABS: 650.0000 mg | ORAL_TABLET | ORAL | Status: DC | PRN

## 2016-01-15 MED ORDER — ACETAMINOPHEN-CODEINE #3 300-30 MG PO TABS
1.0000 | ORAL_TABLET | Freq: Four times a day (QID) | ORAL | Status: DC | PRN
  Administered 2016-01-15: 1 via ORAL
  Filled 2016-01-15 (×2): qty 1

## 2016-01-15 MED ORDER — POLYETHYLENE GLYCOL 3350 17 G PO PACK
17.0000 g | PACK | Freq: Every day | ORAL | Status: DC | PRN
  Administered 2016-01-16: 17 g via ORAL
  Filled 2016-01-15: qty 1

## 2016-01-15 MED ORDER — MELATONIN 5 MG PO TABS: 5.0000 mg | ORAL_TABLET | Freq: Every evening | ORAL | Status: DC | PRN

## 2016-01-15 MED ORDER — PNEUMOCOCCAL VAC POLYVALENT 25 MCG/0.5ML IJ INJ
0.5000 mL | INJECTION | INTRAMUSCULAR | Status: DC
  Filled 2016-01-15: qty 0.5

## 2016-01-15 MED ORDER — IBUPROFEN 600 MG PO TABS
600.0000 mg | ORAL_TABLET | Freq: Once | ORAL | Status: AC
  Administered 2016-01-15: 600 mg via ORAL
  Filled 2016-01-15: qty 1

## 2016-01-15 MED ORDER — INSULIN ASPART PROT & ASPART (70-30 MIX) 100 UNIT/ML ~~LOC~~ SUSP
24.0000 [IU] | Freq: Two times a day (BID) | SUBCUTANEOUS | Status: DC
  Administered 2016-01-15 – 2016-01-16 (×3): 24 [IU] via SUBCUTANEOUS
  Filled 2016-01-15: qty 10

## 2016-01-15 MED ORDER — FUROSEMIDE 10 MG/ML IJ SOLN: 60.0000 mg | Freq: Two times a day (BID) | INTRAMUSCULAR | Status: DC

## 2016-01-15 MED ORDER — ATORVASTATIN CALCIUM 80 MG PO TABS
80.0000 mg | ORAL_TABLET | Freq: Every day | ORAL | Status: DC
  Administered 2016-01-15 – 2016-01-16 (×3): 80 mg via ORAL
  Filled 2016-01-15 (×2): qty 1

## 2016-01-15 NOTE — Progress Notes (Signed)
Again, attempted to ambulate with pt, yet he continues to refuse and wishes to stay in bed.

## 2016-01-15 NOTE — Progress Notes (Signed)
Attempted to ambulate with pt, yet he continues to refuse getting out of bed.

## 2016-01-15 NOTE — H&P (Addendum)
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 Symptoms initially woke the patient up out of his sleep yesterday morning at 1 AM. He reports feeling as though he could not catch his breath. Reports associated symptoms of some chest tightness. Initially thought it was gas, but noted symptoms persisted and he was unable to lay flat. He reports having similar symptoms when he had a COPD exacerbation in the past. Denies any fever, chills, cough, lightheadedness, nausea, vomiting, abdominal pain, diarrhea, or leg swelling. He did not try anything to alleviate symptoms. He notes that his weight had been around 280-290 pounds, but his scale at home have broken and therefore he had not been regularly checking his weight. He sees Dr. Missy Sabins and Dr. Caryl Comes of cardiology for his heart failure. Upon review of records it appears scheduled to have a repeat echocardiogram this month on the 22nd. He does admit to smoking marijuana, but denies any tobacco use or any other illicit drug.  Upon admission patient was evaluated and seen have a weight of 312 pounds. Lab work revealed elevated BNP of 988.6, troponin of 0.03, chest x-ray showing interstitial edema, and EKG showing sinus rhythm with biatrial enlargement. Patient was given 80 mg of Lasix IV, and [facility] called to admit patient into the hospital.  on admission.  Review of Systems  Constitutional: Negative for fever, chills, weight loss, malaise/fatigue and diaphoresis.  HENT: Negative for hearing loss and tinnitus.   Eyes: Negative  for photophobia, pain and discharge.  Respiratory: Positive for shortness of breath. Negative for cough and wheezing.   Cardiovascular: Positive for chest pain (tightness), orthopnea and PND. Negative for leg swelling.  Gastrointestinal: Negative for vomiting and abdominal pain.  Genitourinary: Negative for urgency and frequency.  Musculoskeletal: Negative for back pain and joint pain.  Skin: Negative for itching and rash.  Neurological: Negative for speech change and focal weakness.  Endo/Heme/Allergies: Negative for environmental allergies. Does not bruise/bleed easily.  Psychiatric/Behavioral: Negative for hallucinations and substance abuse.       Past Medical History  Diagnosis Date  . Hypertension   . Morbid obesity ([place])   . Presumed NICM     a. 04/2014 Myoview: EF 26%, glob HK, sev glob HK, ? prior infarct;  b. Never cathed 2/2 CKD.  [name] Chronic systolic CHF (congestive heart failure) ([place])     a. EF 20-25% in 2012. b. EF 45-50% in 10/2011 with nonischemic nuc - presumed NICM. c. 12/2014 Echo: Sev depressed LV fxn, sev dil LV, mild LVH, mild MR, sev dil LA, mildly reduced RV fxn.  . Nephrolithiasis   . OSA on CPAP   . High cholesterol   . Childhood asthma   . Type II diabetes mellitus ([place])   . Anxiety   . Renal cell carcinoma ([place])     a. s/p Rt robotic assisted partial converted to radical nephrectomy on 01/2013.  . Troponin level elevated     a. 04/2014, 12/2014: felt due to CHF.  . CKD (chronic kidney disease) stage 2, GFR 60-89 ml/min   . Aspirin allergy  Past Surgical History  Procedure Laterality Date  . Robotic assited partial nephrectomy Right 01/13/2013    Procedure: ROBOTIC ASSITED PARTIAL NEPHRECTOMY CONVERTED TO ROBOTIC ASSISTED RIGHT RADICAL NEPHRECTOMY;  Surgeon: Alexis Frock, MD;  Location: [facility];  Service: Urology;  Laterality: Right;  . Robotic assisted laparoscopic lysis of adhesion  01/13/2013    Procedure: ROBOTIC ASSISTED LAPAROSCOPIC LYSIS OF  ADHESION EXTENSIVE;  Surgeon: Alexis Frock, MD;  Location: [facility];  Service: Urology;;  . Appendectomy  07/2004  . Cardiac catheterization N/A 05/17/2015    Procedure: Right/Left Heart Cath and Coronary Angiography;  Surgeon: Jolaine Artist, MD;  Location: [place] [facility];  Service: Cardiovascular;  Laterality: N/A;  . Ep implantable device N/A 05/17/2015    Procedure: SubQ ICD Implant;  Surgeon: Will Meredith Leeds, MD;  Location: [place] [facility];  Service: Cardiovascular;  Laterality: N/A;      Social History:  reports that he quit smoking about 9 months ago. He has never used smokeless tobacco. He reports that he drinks alcohol. He reports that he uses illicit drugs (Marijuana). Where does patient live--home    Can patient participate in ADLs?Yes  Allergies  Allergen Reactions  . Aspirin Shortness Of Breath, Itching and Rash     Burning sensation (Patient reports he tolerates other NSAIDS)   . Bee Venom Hives and Swelling  . Lisinopril Cough  . Tomato Rash    Family History  Problem Relation Age of Onset  . Heart attack Neg Hx   . Stroke Neg Hx   . Hypertension Mother   . Diabetes Maternal Aunt       Prior to Admission medications   Medication Sig Start Date End Date Taking? Authorizing Provider  allopurinol (ZYLOPRIM) 100 MG tablet Take 1 tablet (100 mg total) by mouth daily. 05/21/15  Yes Arnoldo Morale, MD  atorvastatin (LIPITOR) 80 MG tablet TAKE 1 TABLET (80 MG TOTAL) BY MOUTH EVERY EVENING. 12/25/15  Yes Shirley Friar, PA-C  carvedilol (COREG) 25 MG tablet Take 1 tablet (25 mg total) by mouth 2 (two) times daily with a meal. 12/25/15  Yes Shirley Friar, PA-C  hydrALAZINE (APRESOLINE) 25 MG tablet Take 1 tablet (25 mg total) by mouth 3 (three) times daily. 12/25/15  Yes Shirley Friar, PA-C  insulin NPH-regular Human (NOVOLIN 70/30) (70-30) 100 UNIT/ML injection Inject 34 Units into the skin 2 (two) times daily with a meal. 05/21/15  Yes  Arnoldo Morale, MD  isosorbide mononitrate (IMDUR) 30 MG 24 hr tablet Take 1 tablet (30 mg total) by mouth daily. 12/25/15  Yes Shirley Friar, PA-C  Melatonin 5 MG TABS Take 5 mg by mouth at bedtime as needed.    Yes Historical Provider, MD  sacubitril-valsartan (ENTRESTO) 24-26 MG Take 1 tablet by mouth 2 (two) times daily. 09/10/15  Yes Amy D Clegg, NP  spironolactone (ALDACTONE) 25 MG tablet Take 1 tablet (25 mg total) by mouth daily. 12/25/15  Yes Shirley Friar, PA-C  torsemide (DEMADEX) 20 MG tablet Take 1 tablet (20 mg total) by mouth daily. 12/25/15  Yes Shirley Friar, PA-C     Physical Exam: Filed Vitals:   01/14/16 1425 01/14/16 1426 01/14/16 2059 01/14/16 2230  BP: 158/119  140/121 137/98  Pulse: 88  83 86  Temp: 97.5 F (36.4 C)  98.6 F (37 C)   TempSrc: Oral  Oral   Resp: 16  22 17   Height: 5\' 9"  (1.753 m)     Weight: 141.069  kg (311 lb) 141.522 kg (312 lb)    SpO2: 97%  99% 95%     Constitutional: Vital signs reviewed. Patient is morbidly obese and in no acute distress and cooperative with exam. Alert and oriented x3.  Head: Normocephalic and atraumatic  Ear: TM normal bilaterally  Mouth: no erythema or exudates, MMM  Eyes: PERRL, EOMI, conjunctivae normal, No scleral icterus.  Neck: Supple, Trachea midline normal ROM, No JVD, mass, thyromegaly, or carotid bruit present.  Cardiovascular: RRR, S1 normal, S2 normal, no MRG, pulses symmetric and intact bilaterally  Pulmonary/Chest: Mildly decreased aeration. No wheezes, rales, or rhonchi appreciated. Abdominal: Soft. Non-tender, non-distended, bowel sounds are normal, no masses, organomegaly, or guarding present.  GU: no CVA tenderness Musculoskeletal: No joint deformities, erythema, or stiffness, ROM full and no nontender Ext: Trace edema and no cyanosis, pulses palpable bilaterally (DP and PT)  Hematology: no cervical, inginal, or axillary adenopathy.  Neurological: A&O x3, Strenght is normal and  symmetric bilaterally, cranial nerve II-XII are grossly intact, no focal motor deficit, sensory intact to light touch bilaterally.  Skin: Warm, dry and intact. No rash, cyanosis, or clubbing.  Psychiatric: Normal mood and affect. speech and behavior is normal. Judgment and thought content normal. Cognition and memory are normal.      Data Review   Micro Results No results found for this or any previous visit (from the past 240 hour(s)).  Radiology Reports Dg Chest 2 View  01/14/2016  CLINICAL DATA:  40 year old male with chest pain and shortness of breath since this morning. Initial encounter. EXAM: CHEST  2 VIEW COMPARISON:  None available. FINDINGS: Left anterior chest wall AICD type device. Mild cardiomegaly. Other mediastinal contours are within normal limits. Visualized tracheal air column is within normal limits. Diffuse increased interstitial opacity. No pneumothorax or pleural effusion. No confluent opacity. No acute osseous abnormality identified. IMPRESSION: 1. Diffuse increased interstitial opacity suspicious for interstitial edema, or possibly viral/atypical respiratory infection. 2. No pleural effusion.  Mild cardiomegaly. Electronically Signed   By: Genevie Ann M.D.   On: 01/14/2016 15:56     CBC  Recent Labs Lab 01/14/16 1432  WBC 8.6  HGB 12.8*  HCT 40.1  PLT 278  MCV 72.5*  MCH 23.1*  MCHC 31.9  RDW 18.1*    Chemistries   Recent Labs Lab 01/14/16 1432  NA 139  K 4.3  CL 109  CO2 20*  GLUCOSE 118*  BUN 12  CREATININE 1.46*  CALCIUM 9.1   ------------------------------------------------------------------------------------------------------------------ estimated creatinine clearance is 93.2 mL/min (by C-G formula based on Cr of 1.46). ------------------------------------------------------------------------------------------------------------------ No results for input(s): HGBA1C in the last 72  hours. ------------------------------------------------------------------------------------------------------------------ No results for input(s): CHOL, HDL, LDLCALC, TRIG, CHOLHDL, LDLDIRECT in the last 72 hours. ------------------------------------------------------------------------------------------------------------------ No results for input(s): TSH, T4TOTAL, T3FREE, THYROIDAB in the last 72 hours.  Invalid input(s): FREET3 ------------------------------------------------------------------------------------------------------------------ No results for input(s): VITAMINB12, FOLATE, FERRITIN, TIBC, IRON, RETICCTPCT in the last 72 hours.  Coagulation profile No results for input(s): INR, PROTIME in the last 168 hours.  No results for input(s): DDIMER in the last 72 hours.  Cardiac Enzymes No results for input(s): CKMB, TROPONINI, MYOGLOBIN in the last 168 hours.  Invalid input(s): CK ------------------------------------------------------------------------------------------------------------------ Invalid input(s): POCBNP   CBG:  Recent Labs Lab 01/14/16 2337  GLUCAP 99       EKG: Independently reviewed. Sinus rhythm with signs of biatrial enlargement   Assessment/Plan Combined systolic and diastolic congestive heart failure exacerbation: Acute. Patient with reports of paroxysmal nocturnal dyspnea and  orthopnea. Found to have elevated BNP of 988.6 with chest x-ray showing interstitial edema. Last echocardiogram showed EF 20-25% with global hypokinesis. Patient was given 80mg  of Lasix IV while in the ED. - Admit to telemetry bed - Strict ins and outs and daily weights -  Lasix 40 mg IV twice a day (held home torsemide 20mg ) - Continued Imdur, Coreg, spironolactone - held entresto and hydralazine  during acute diuresis as when - Will need to consult cardiology in a.m.  Diabetes mellitus type 2: Last hemoglobin A1c 6 in 11/2015 - Continue reduced dose of 70/30 mix of 24  units twice a day. Adjust as needed - CBGs every before meals and at bedtime - Heart healthy /cardiac diet  Chronic kidney disease stage III: The patient's creatinine on admission 1.46 which is better than baseline seen previously of 1.62 - 2.17  - Continue to monitor    Essential hypertension  - Medications as seen above   Hyperlipidemia - Continue atorvastatin  History of gout: no acute flair at this time - Continue allopurinol    Status post ICD: Stable  Morbid obesity: Stable  Lovenox for DVT prophylaxis   Code Status:   full Family Communication: bedside Disposition Plan: admit   Total time spent 55 minutes.Greater than 50% of this time was spent in counseling, explanation of diagnosis, planning of further management, and coordination of care  [facility] Pager [phone]  If 7PM-7AM, please contact night-coverage [email or web address] Password [email or web address] 01/15/2016, 1:42 AM

## 2016-01-15 NOTE — Progress Notes (Signed)
Patient request Miralax to help bowels to move. Text paged hospitalist. Will administer medication as ordered and continue to monitor patient to end of shift.

## 2016-01-15 NOTE — Progress Notes (Signed)
Pt refuse NIV for the night. Pt is stable at this time.

## 2016-01-15 NOTE — Progress Notes (Addendum)
PROGRESS NOTE    Jeffrey [name]  [id number] DOB: 1976-04-09 DOA: 01/14/2016 PCP: Angelica Chessman, MD  Outpatient Specialists: ACHF team    Brief Narrative:  40 y/o ? 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 with acute onset SOB, does not have a proper weighing scale at home -Supposed to see advanced heart failure team as well as EP later this month. Baseline weight 280-290 pounds and on admission 312 pounds BNP 988 Troponin 0.03 EKG = biatrial enlargement, paced rhythm Patient given a dose of IV Lasix and patient admitted     Assessment & Plan:   Principal Problem:   Acute on chronic systolic HF (heart failure) (HCC)-we will let advanced heart failure team know patient is here. For now diuresis with Lasix 40 mg IV every 12. I have held the patient's [facility] given risk for renal insufficiency and the fact that patient is on multiple diuretics such as Aldactone. May need to adjust dose of beta blocker given acute decompensation.  Patient's dry weight is typically to 280-290 and he is 302 so he may require IV diuresis for a while prior to discharge. He is aware of strict I's and O's Avoid NSAIDs in the setting of heart failure   Status post AICD for ischemic  cardiomyopathy-follows with Dr. Klein-echocardiogram this admission is pending--outpatient management and evaluation in the EP needed probably in a month to month and a half   Obstructive sleep apnea and not compliant on CPAP at present-we have asked for auto titration device daily at bedtime, I have requested DME to supply some replacement parts for his machine and or a new machine-he may need to follow up with community health and wellness to procure this.   HYPERTENSION, BENIGN ESSENTIAL-continue Coreg 25 twice a day with meals, Imdur 30 daily-blood pressure is well controlled   DM (diabetes mellitus) (HCC)-continue 7030 insulin 24 units twice a day-blood sugars are 111-124   Gout-continue allopurinol 100 daily-continue to monitor crit and kidney function   Renal cell CA with a history of partial nephrectomy 2014-monitor kidney function carefully with diuresis   CKD (chronic kidney disease), stage III-see above discussion   Microalbuminurea-needs outpatient monitoring. Careful re-addition of ACE inhibitor   Unlikely etiology-interstitial opacity likely secondary to volume overload as above?Viral PNA-    DVT prophylaxis: Lovenox Code Status: Full Family Communication: none Disposition Plan:  will need to diuresis until feel appropriate dry weight and balance kidney function prior to discharge present   Consultants:   Advanced heart failure team  Procedures:  [facility] 05/17/15 RA = 7 RV = 42/3/7 PA = 43/30 (39) PCW = 26 Fick cardiac output/index = 4.9/2.1 PVR = 2.7 WU SVR = 1651 FA sat = 95% PA sat = 65%, 65% Ao = 122/97 (109) LV = 110/31/33   Antimicrobials:    Subjective:  she is close to normal Tolerating diet Sat up on side of  the bed but desatted Tells me his CPAP machine doesn't work No chest pain at present No blurred or double vision No unilateral weakness No diarrhea  Objective: Filed Vitals:   01/15/16 0158 01/15/16 0159 01/15/16 0332 01/15/16 0631  BP:   152/120 128/86 103/65  Pulse:  93 90 69  Temp:  98.2 F (36.8 C)  98 F (36.7 C)  TempSrc:  Oral  Oral  Resp:  20  20  Height: 5\' 7"  (1.702 m)     Weight: 137.077 kg (302 lb 3.2 oz)     SpO2:  96%  96%   No intake or output data in the 24 hours ending 01/15/16 0806 Filed Weights   01/14/16 1425 01/14/16 1426 01/15/16 0158  Weight: 141.069 kg (311 lb) 141.522 kg (312 lb) 137.077 kg (302 lb 3.2 oz)    Examination:  General exam: Appears calm and comfortable  Respiratory system: Clear to auscultation. Respiratory effort normal. Cardiovascular system: S1 & S2 heard, RRR. No JVD appreciated by my exam , murmurs, rubs, gallops or clicks. No pedal edema. Gastrointestinal system: Abdomen is distended, soft and nontender Poor exam and cannot appreciate organomegaly given habitus Central nervous system: Alert and oriented. No focal neurological deficits. Extremities: Symmetric 5 x 5 power. Skin: No rashes, lesions or ulcers Psychiatry: Judgement and insight appear normal. Mood & affect appropriate.     Data Reviewed: I have personally reviewed following labs and imaging studies  CBC:  Recent Labs Lab 01/14/16 1432  WBC 8.6  HGB 12.8*  HCT 40.1  MCV 72.5*  PLT 0000000   Basic Metabolic Panel:  Recent Labs Lab 01/14/16 1432  NA 139  K 4.3  CL 109  CO2 20*  GLUCOSE 118*  BUN 12  CREATININE 1.46*  CALCIUM 9.1   GFR: Estimated Creatinine Clearance: 89 mL/min (by C-G formula based on Cr of 1.46). Liver Function Tests: No results for input(s): AST, ALT, ALKPHOS, BILITOT, PROT, ALBUMIN in the last 168 hours. No results for input(s): LIPASE, AMYLASE in the last 168 hours. No results for input(s): AMMONIA in the last 168 hours. Coagulation Profile: No results for input(s): INR, PROTIME in the last 168 hours. Cardiac Enzymes: No results for input(s): CKTOTAL, CKMB, CKMBINDEX, TROPONINI in the last 168 hours. BNP (last 3 results) No results for input(s): PROBNP in the last  8760 hours. HbA1C: No results for input(s): HGBA1C in the last 72 hours. CBG:  Recent Labs Lab 01/14/16 2337 01/15/16 0330 01/15/16 0630  GLUCAP 99 124* 111*   Lipid Profile: No results for input(s): CHOL, HDL, LDLCALC, TRIG, CHOLHDL, LDLDIRECT in the last 72 hours. Thyroid Function Tests: No results for input(s): TSH, T4TOTAL, FREET4, T3FREE, THYROIDAB in the last 72 hours. Anemia Panel: No results for input(s): VITAMINB12, FOLATE, FERRITIN, TIBC, IRON, RETICCTPCT in the last 72 hours. Urine analysis:    Component Value Date/Time   COLORURINE YELLOW 06/18/2015 0156   APPEARANCEUR CLOUDY* 06/18/2015 0156   LABSPEC 1.011 06/18/2015 0156   PHURINE 6.0 06/18/2015 0156   GLUCOSEU NEGATIVE 06/18/2015 0156   HGBUR NEGATIVE 06/18/2015 0156   BILIRUBINUR NEGATIVE 06/18/2015 0156   KETONESUR NEGATIVE 06/18/2015 0156   PROTEINUR 30* 06/18/2015 0156   UROBILINOGEN 1.0 06/18/2015 0156   NITRITE NEGATIVE 06/18/2015 0156   LEUKOCYTESUR NEGATIVE 06/18/2015 0156   Sepsis Labs: @LABRCNTIP (procalcitonin:4,lacticidven:4)  )No results found for this or any previous visit (from the past 240 hour(s)).       Radiology Studies: Dg Chest 2 View  01/14/2016  CLINICAL  DATA:  40 year old male with chest pain and shortness of breath since this morning. Initial encounter. EXAM: CHEST  2 VIEW COMPARISON:  None available. FINDINGS: Left anterior chest wall AICD type device. Mild cardiomegaly. Other mediastinal contours are within normal limits. Visualized tracheal air column is within normal limits. Diffuse increased interstitial opacity. No pneumothorax or pleural effusion. No confluent opacity. No acute osseous abnormality identified. IMPRESSION: 1. Diffuse increased interstitial opacity suspicious for interstitial edema, or possibly viral/atypical respiratory infection. 2. No pleural effusion.  Mild cardiomegaly. Electronically Signed   By: Genevie Ann M.D.   On: 01/14/2016 15:56        Scheduled  Meds: . allopurinol  100 mg Oral Daily  . atorvastatin  80 mg Oral q1800  . carvedilol  25 mg Oral BID WC  . enoxaparin (LOVENOX) injection  70 mg Subcutaneous Q24H  . furosemide  40 mg Intravenous Q12H  . insulin aspart protamine- aspart  24 Units Subcutaneous BID WC  . isosorbide mononitrate  30 mg Oral Daily  . [START ON 01/16/2016] pneumococcal 23 valent vaccine  0.5 mL Intramuscular Tomorrow-1000  . sodium chloride flush  3 mL Intravenous Q12H  . spironolactone  25 mg Oral Daily   Continuous Infusions:       Time spent: [place], MD [facility] ([facility]   If 7PM-7AM, please contact night-coverage [email or web address] Password [email or web address] 01/15/2016, 8:06 AM

## 2016-01-15 NOTE — Progress Notes (Signed)
Patient transferred from the ED via stretcher to room 3E14. Oriented patient to room equipment and educated patient to the heart failure floor. Currently patient request something for headache and sleep aid. Text paged on-call hospitalist. Will administer medicatons as ordered and continue to monitor patient to end of shift.

## 2016-01-16 ENCOUNTER — Inpatient Hospital Stay (HOSPITAL_COMMUNITY): Payer: Self-pay

## 2016-01-16 DIAGNOSIS — I5023 Acute on chronic systolic (congestive) heart failure: Secondary | ICD-10-CM

## 2016-01-16 DIAGNOSIS — I509 Heart failure, unspecified: Secondary | ICD-10-CM

## 2016-01-16 LAB — CBC
HEMATOCRIT: 43.6 % (ref 39.0–52.0)
HEMOGLOBIN: 14.4 g/dL (ref 13.0–17.0)
MCH: 23.8 pg — AB (ref 26.0–34.0)
MCHC: 33 g/dL (ref 30.0–36.0)
MCV: 72.1 fL — ABNORMAL LOW (ref 78.0–100.0)
Platelets: 282 10*3/uL (ref 150–400)
RBC: 6.05 MIL/uL — ABNORMAL HIGH (ref 4.22–5.81)
RDW: 18.1 % — ABNORMAL HIGH (ref 11.5–15.5)
WBC: 8.3 10*3/uL (ref 4.0–10.5)

## 2016-01-16 LAB — BASIC METABOLIC PANEL
ANION GAP: 12 (ref 5–15)
BUN: 20 mg/dL (ref 6–20)
CO2: 24 mmol/L (ref 22–32)
Calcium: 9.4 mg/dL (ref 8.9–10.3)
Chloride: 105 mmol/L (ref 101–111)
Creatinine, Ser: 1.57 mg/dL — ABNORMAL HIGH (ref 0.61–1.24)
GFR calc Af Amer: >60 mL/min (ref >60)
GFR calc non Af Amer: 53 mL/min — ABNORMAL LOW (ref >60)
GLUCOSE: 95 mg/dL (ref 65–99)
POTASSIUM: 3.6 mmol/L (ref 3.5–5.1)
Sodium: 141 mmol/L (ref 135–145)

## 2016-01-16 LAB — ECHOCARDIOGRAM COMPLETE
Height: 67 in
Weight: 4697.6 oz

## 2016-01-16 LAB — GLUCOSE, CAPILLARY
GLUCOSE-CAPILLARY: 111 mg/dL — AB (ref 65–99)
Glucose-Capillary: 98 mg/dL (ref 65–99)

## 2016-01-16 NOTE — Progress Notes (Signed)
Orders received for pt discharge.  Discharge summary printed and reviewed with pt.  Explained medication regimen, and pt had no further questions at this time.  IV removed and site remains clean, dry, intact.  Telemetry removed.  Pt in stable condition and awaiting transport. 

## 2016-01-16 NOTE — Progress Notes (Signed)
Jeffrey [name] with [facility] talked to patient about the CPAP machine cost $150 per month. Patient is not agreeable to this and does not want to have the CPAP at discharge. If patient change his mind, his PCP can order the machine after discharge. Mindi Slicker [facility] [phone]

## 2016-01-16 NOTE — Consult Note (Signed)
Advanced Heart Failure Team Consult Note  Referring Physician: Dr. Verlon Au Primary Physician: Angelica Chessman MD Primary Cardiologist:  Canal Fulton  Reason for Consultation: A/C CHF  HPI:    Ms. 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   He presented to [facility] 01/14/16 with sudden onset SOB with accompanying chest tightness, orthopnea, and PND. He denied edema. Pertinent labs on admission include K 4.3, Creatinine 1.46, BNP 988.6, Troponin negative. CXR showed diffuse increased interstitial opacity suspicious for interstitial edema vs viral/atypical PNA. No effusion.   Last seen in HF clinic 12/25/15. Volume status stable. Coreg increased.   He has diuresed 3.1 L thus far and is down 9 lbs.   Currently feeling fine.  States he didn't take any medicine Monday, prior to feeling bad.  Has been busy with family, sometimes misses meds. Denies SOB, CP, lightheadedness, dizziness, orthopnea, or edema currently.  Weighs daily and weight had been trending up recently.   Now working at [employer]. Tries to watch what he eats and drinks.   Review of Systems: [y] = yes, [ ]  = no   General: Weight gain [y]; Weight loss [ ] ; Anorexia [ ] ; Fatigue [ ] ; Fever [ ] ; Chills [ ] ; Weakness [ ]   Cardiac: Chest pain/pressure [y]; Resting SOB [ ] ; Exertional SOB [y]; Orthopnea [ ] ; Pedal Edema [y]; Palpitations [ ] ; Syncope [ ] ; Presyncope [ ] ; Paroxysmal nocturnal dyspnea[ ]   Pulmonary: Cough [ ] ; Wheezing[ ] ; Hemoptysis[ ] ; Sputum [ ] ; Snoring [ ]   GI: Vomiting[ ] ; Dysphagia[ ] ; Melena[ ] ; Hematochezia [ ] ; Heartburn[ ] ; Abdominal pain [ ] ; Constipation [ ] ; Diarrhea [ ] ; BRBPR [ ]   GU: Hematuria[ ] ; Dysuria [ ] ; Nocturia[ ]   Vascular: Pain in legs with walking [ ] ; Pain in feet with lying flat [ ] ; Non-healing sores [ ] ; Stroke [ ] ; TIA  [ ] ; Slurred speech [ ] ;  Neuro: Headaches[ ] ; Vertigo[ ] ; Seizures[ ] ; Paresthesias[ ] ;Blurred vision [ ] ; Diplopia [ ] ; Vision changes [ ]   Ortho/Skin: Arthritis [ ] ; Joint pain [ ] ; Muscle pain [ ] ; Joint swelling [ ] ; Back Pain [ ] ; Rash [ ]   Psych: Depression[ ] ; Anxiety[ ]   Heme: Bleeding problems [ ] ; Clotting disorders [ ] ; Anemia [ ]   Endocrine: Diabetes [ ] ; Thyroid dysfunction[ ]   Home Medications Prior to Admission medications   Medication Sig Start Date End Date Taking? Authorizing Provider  allopurinol (ZYLOPRIM) 100 MG tablet Take 1 tablet (100 mg total) by mouth daily. 05/21/15  Yes Arnoldo Morale, MD  atorvastatin (LIPITOR) 80 MG tablet TAKE 1 TABLET (80 MG TOTAL) BY MOUTH EVERY EVENING. 12/25/15  Yes Shirley Friar, PA-C  carvedilol (COREG) 25 MG tablet Take 1 tablet (25 mg total) by mouth 2 (two) times daily with a meal. 12/25/15  Yes Shirley Friar, PA-C  hydrALAZINE (APRESOLINE) 25 MG tablet Take 1 tablet (25 mg total) by mouth 3 (three) times daily. 12/25/15  Yes Shirley Friar, PA-C  insulin NPH-regular Human (NOVOLIN 70/30) (70-30) 100 UNIT/ML injection Inject 34 Units into the skin 2 (two) times daily with a meal. 05/21/15  Yes Arnoldo Morale, MD  isosorbide mononitrate (IMDUR) 30 MG 24 hr tablet Take 1 tablet (30 mg total) by mouth daily. 12/25/15  Yes Shirley Friar, PA-C  Melatonin 5 MG TABS Take 5 mg  by mouth at bedtime as needed.    Yes Historical Provider, MD  sacubitril-valsartan (ENTRESTO) 24-26 MG Take 1 tablet by mouth 2 (two) times daily. 09/10/15  Yes Amy D Clegg, NP  spironolactone (ALDACTONE) 25 MG tablet Take 1 tablet (25 mg total) by mouth daily. 12/25/15  Yes Shirley Friar, PA-C  torsemide (DEMADEX) 20 MG tablet Take 1 tablet (20 mg total) by mouth daily. 12/25/15  Yes Shirley Friar, PA-C    Past Medical History: Past Medical History  Diagnosis Date  . Hypertension   . Morbid obesity ([place])   . Presumed NICM      a. 04/2014 Myoview: EF 26%, glob HK, sev glob HK, ? prior infarct;  b. Never cathed 2/2 CKD.  [name] Chronic systolic CHF (congestive heart failure) ([place])     a. EF 20-25% in 2012. b. EF 45-50% in 10/2011 with nonischemic nuc - presumed NICM. c. 12/2014 Echo: Sev depressed LV fxn, sev dil LV, mild LVH, mild MR, sev dil LA, mildly reduced RV fxn.  . Nephrolithiasis   . OSA on CPAP   . High cholesterol   . Childhood asthma   . Type II diabetes mellitus ([place])   . Anxiety   . Renal cell carcinoma ([place])     a. s/p Rt robotic assisted partial converted to radical nephrectomy on 01/2013.  . Troponin level elevated     a. 04/2014, 12/2014: felt due to CHF.  . CKD (chronic kidney disease) stage 2, GFR 60-89 ml/min   . Aspirin allergy     Past Surgical History: Past Surgical History  Procedure Laterality Date  . Robotic assited partial nephrectomy Right 01/13/2013    Procedure: ROBOTIC ASSITED PARTIAL NEPHRECTOMY CONVERTED TO ROBOTIC ASSISTED RIGHT RADICAL NEPHRECTOMY;  Surgeon: Alexis Frock, MD;  Location: [facility];  Service: Urology;  Laterality: Right;  . Robotic assisted laparoscopic lysis of adhesion  01/13/2013    Procedure: ROBOTIC ASSISTED LAPAROSCOPIC LYSIS OF ADHESION EXTENSIVE;  Surgeon: Alexis Frock, MD;  Location: [facility];  Service: Urology;;  . Appendectomy  07/2004  . Cardiac catheterization N/A 05/17/2015    Procedure: Right/Left Heart Cath and Coronary Angiography;  Surgeon: Jolaine Artist, MD;  Location: [place] [facility];  Service: Cardiovascular;  Laterality: N/A;  . Ep implantable device N/A 05/17/2015    Procedure: SubQ ICD Implant;  Surgeon: Will Meredith Leeds, MD;  Location: [place] [facility];  Service: Cardiovascular;  Laterality: N/A;    Family History: Family History  Problem Relation Age of Onset  . Heart attack Neg Hx   . Stroke Neg Hx   . Hypertension Mother   . Diabetes Maternal Aunt     Social History: Social History   Social History  . Marital Status:  Single    Spouse Name: N/A  . Number of Children: N/A  . Years of Education: N/A   Social History Main Topics  . Smoking status: Former Smoker -- 0.25 packs/day for 22 years    Quit date: 04/16/2015  . Smokeless tobacco: Never Used  . Alcohol Use: No     Comment: occ   . Drug Use: 14.00 per week    Special: Marijuana     Comment: uses marijuana twice a day  . Sexual Activity: Yes    Birth Control/ Protection: Condom   Other Topics Concern  . None   Social History Narrative    Allergies:  Allergies  Allergen Reactions  . Aspirin Shortness Of Breath, Itching and Rash  Burning sensation (Patient reports he tolerates other NSAIDS)   . Bee Venom Hives and Swelling  . Lisinopril Cough  . Tomato Rash    Objective:    Vital Signs:   Temp:  [98 F (36.7 C)-98.6 F (37 C)] 98 F (36.7 C) (04/13 0557) Pulse Rate:  [64-86] 64 (04/13 0557) Resp:  [16-18] 16 (04/13 0557) BP: (106-130)/(64-95) 130/95 mmHg (04/13 0557) SpO2:  [94 %-99 %] 99 % (04/13 0557) Weight:  [293 lb 9.6 oz (133.176 kg)] 293 lb 9.6 oz (133.176 kg) (04/13 0557) Last BM Date: 01/14/16  Weight change: Filed Weights   01/14/16 1426 01/15/16 0158 01/16/16 0557  Weight: 312 lb (141.522 kg) 302 lb 3.2 oz (137.077 kg) 293 lb 9.6 oz (133.176 kg)    Intake/Output:   Intake/Output Summary (Last 24 hours) at 01/16/16 1157 Last data filed at 01/16/16 1050  Gross per 24 hour  Intake   1508 ml  Output   3250 ml  Net  -1742 ml     Physical Exam: General:  Well appearing. No resp difficulty HEENT: normal Neck: supple. JVP 6-7. Carotids 2+ bilat; no bruits. No lymphadenopathy or thyromegaly appreciated. Cor: PMI nondisplaced. Regular rate & rhythm. No M/G/R appreciated Lungs: clear Abdomen: soft, nontender, nondistended. No hepatosplenomegaly. No bruits or masses. Good bowel sounds. Extremities: no cyanosis, clubbing, rash. No edema Neuro: alert & orientedx3, cranial nerves grossly intact. moves all 4  extremities w/o difficulty. Affect pleasant  Telemetry: NSR  Labs: Basic Metabolic Panel:  Recent Labs Lab 01/14/16 1432 01/16/16 0527  NA 139 141  K 4.3 3.6  CL 109 105  CO2 20* 24  GLUCOSE 118* 95  BUN 12 20  CREATININE 1.46* 1.57*  CALCIUM 9.1 9.4    Liver Function Tests: No results for input(s): AST, ALT, ALKPHOS, BILITOT, PROT, ALBUMIN in the last 168 hours. No results for input(s): LIPASE, AMYLASE in the last 168 hours. No results for input(s): AMMONIA in the last 168 hours.  CBC:  Recent Labs Lab 01/14/16 1432 01/16/16 0527  WBC 8.6 8.3  HGB 12.8* 14.4  HCT 40.1 43.6  MCV 72.5* 72.1*  PLT 278 282    Cardiac Enzymes: No results for input(s): CKTOTAL, CKMB, CKMBINDEX, TROPONINI in the last 168 hours.  BNP: BNP (last 3 results)  Recent Labs  05/14/15 1850 12/25/15 1034 01/14/16 1432  BNP 559.1* 756.3* 988.6*    ProBNP (last 3 results) No results for input(s): PROBNP in the last 8760 hours.   CBG:  Recent Labs Lab 01/15/16 0630 01/15/16 1747 01/15/16 2118 01/16/16 0608 01/16/16 1102  GLUCAP 111* 99 87 98 111*    Coagulation Studies: No results for input(s): LABPROT, INR in the last 72 hours.  Other results: EKG: 01/15/16 NSR 88 bpm  Imaging: Dg Chest 2 View  01/14/2016  CLINICAL DATA:  40 year old male with chest pain and shortness of breath since this morning. Initial encounter. EXAM: CHEST  2 VIEW COMPARISON:  None available. FINDINGS: Left anterior chest wall AICD type device. Mild cardiomegaly. Other mediastinal contours are within normal limits. Visualized tracheal air column is within normal limits. Diffuse increased interstitial opacity. No pneumothorax or pleural effusion. No confluent opacity. No acute osseous abnormality identified. IMPRESSION: 1. Diffuse increased interstitial opacity suspicious for interstitial edema, or possibly viral/atypical respiratory infection. 2. No pleural effusion.  Mild cardiomegaly. Electronically  Signed   By: Genevie Ann M.D.   On: 01/14/2016 15:56      Medications:     Current Medications: .  allopurinol  100 mg Oral Daily  . atorvastatin  80 mg Oral q1800  . carvedilol  25 mg Oral BID WC  . enoxaparin (LOVENOX) injection  70 mg Subcutaneous Q24H  . furosemide  40 mg Intravenous Q12H  . insulin aspart protamine- aspart  24 Units Subcutaneous BID WC  . isosorbide mononitrate  30 mg Oral Daily  . pneumococcal 23 valent vaccine  0.5 mL Intramuscular Tomorrow-1000  . sodium chloride flush  3 mL Intravenous Q12H  . spironolactone  25 mg Oral Daily     Infusions:      Assessment   1. Chronic systolic congestive heart failure: s/p [employer] subcutaneous ICD implant 05/17/15. 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 Echo 05/07/15 with 20-25% EF. CMP may be due to HTN.  NYHA class II symptoms.  2. Hypertension 3. CKD stage III 4. OSA 5. Tobacco use - Congratulated on continued abstinence. 6. Gout 7. DM2 8. ? Situational depression- HF SW for coping mechanisms.  9.Renal Cell Carcinoma - S/P nephrectomy 2014.  10. Obesity  Plan    Repeat Echo today stable. EF 20-25%, Grade 3 DD.   Volume status stable on exam. Creatinine stable.  Weight at lowest point in some time.  Needs to be more compliant with CPAP and meds. Also needs to increase activity and lose weight.   Missed all of his medicines on Monday and occasionally misses doses. Re-stressed importance of medical compliance.  He should continue previous meds, taking them as directed. He should take an extra 20 mg of torsemide as needed for weight gain. Encouraged daily weights.   He is OK for discharge from HF stand point. We will set up follow up in the HF clinic in 2-3 weeks.   Length of Stay: 1  Shirley Friar PA-C 01/16/2016, 11:57 AM  Advanced Heart Failure Team Pager [phone] (M-F; 7a - 4p)  Please contact [facility] for night-coverage after hours (4p -7a ) and weekends on  [email or web address]  Patient seen and examined with Oda Kilts, PA-C. We discussed all aspects of the encounter. I agree with the assessment and plan as stated above.   Echo reviewed personally. EF 20-25%. He is much improved with diuresis. Now euvolemic. Renal function stable. I think he can go home today. Continue torsemide 20mg  daily. Reinforced need for daily weights and reviewed use of sliding scale diuretics. Will follow up in Clinic in 2-3 weeks.   Bensimhon, Daniel,MD 6:19 PM

## 2016-01-16 NOTE — Progress Notes (Signed)
[facility] called for CPAP machine to be delivered to the patient today prior to discharging home; Aneta Mins [phone]

## 2016-01-16 NOTE — Progress Notes (Signed)
Notified by CMT of patient having 4 beats of V-Tach. Patient was sound asleep when it occurred. Notified on-call hospitalist for informational purposes for Cardiology has not consulted on patient yet. Currently patient complains of no pain or discomfort at this time. Will continue to monitor patient to end of shift.

## 2016-01-16 NOTE — Progress Notes (Signed)
Pt refuses pneumonia vaccine at discharge.

## 2016-01-16 NOTE — Progress Notes (Signed)
  Echocardiogram 2D Echocardiogram has been performed.  Jennette Dubin 01/16/2016, 11:54 AM

## 2016-01-16 NOTE — Discharge Summary (Signed)
Physician Discharge Summary  Jeffrey [name] [id number] DOB: March 17, 1976 DOA: 01/14/2016  PCP: Angelica Chessman, MD  Admit date: 01/14/2016 Discharge date: 01/16/2016  Time spent: 35 minutes  Recommendations for Outpatient Follow-up:  1. needs OP ACHF clinic f/u 2. DME CPAP machine ordered 3. No change to home meds  Discharge Diagnoses:  Principal Problem:   Acute on chronic systolic HF (heart failure) (HCC) Active Problems:   HYPERTENSION, BENIGN ESSENTIAL   DM (diabetes mellitus) (HCC)   Gout   CKD (chronic kidney disease), stage III   CHF exacerbation (HCC)   Discharge Condition: stable  Diet recommendation:  hh low salt, fluid controlled  Filed Weights   01/14/16 1426 01/15/16 0158 01/16/16 0557  Weight: 141.522 kg (312 lb) 137.077 kg (302 lb 3.2 oz) 133.176 kg (293 lb 9.6 oz)    History of present illness:  39 y/o ? 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 with acute onset SOB, does not have a proper weighing scale at home -Supposed to see advanced heart failure team as well as EP later this month. Baseline weight 280-290 pounds and on admission 312 pounds BNP 988 Troponin 0.03 EKG = biatrial enlargement, paced rhythm Patient given a dose of IV Lasix and patient  admitted   Hospital Course:    Acute on chronic systolic HF (heart failure) (HCC)-appreicate advanced heart failure team input Continue home dose of Demadex 20 mg-patient has been given instructions in terms of titration as an outpatient by heart failure team  Initially held held the patient's [facility] given risk for renal insufficiency and the fact that patient is on multiple diuretics such as Aldactone. . Patient's dry weight is typically to 280-290 and he is 302  diuresed 2-93 pounds while in hospital and stabilized from cardiac perspective for discharge home Avoid NSAIDs in the setting of heart failure   Status post AICD for ischemic cardiomyopathy-follows with Dr. Caryl Comes -echocardiogram this admission is as above --outpatient management and evaluation in the EP needed probably in a month to month and a half   Obstructive sleep apnea and not compliant on CPAP at present-we have asked for auto titration device daily at bedtime,  I have requested DME to supply some replacement parts for his machine   HYPERTENSION, BENIGN ESSENTIAL-continue Coreg 25 twice a day with meals, Imdur 30 daily-blood pressure is well controlled  DM (diabetes mellitus) (HCC)-continue 7030 insulin 24 units twice a day-blood sugars are 111-124  Gout-continue allopurinol 100 daily-continue to monitor crit and kidney function  Renal cell CA with a history of partial nephrectomy 2014-monitor kidney function carefully with diuresis--will be done as an outpatient  CKD (chronic kidney disease), stage III-see above discussion  Microalbuminurea-needs outpatient monitoring. Careful re-addition of ACE inhibitor  Unlikely etiology-interstitial opacity likely secondary to volume overload as above?Viral PNA-was unlikely on discharge and it was felt to be volume overload related  Procedures: Study Conclusions 01/16/2016  -  Left ventricle: The cavity size was severely dilated. Wall  thickness was increased in a  pattern of moderate LVH. Systolic  function was severely reduced. The estimated ejection fraction  was in the range of 20% to 25%. Doppler parameters are consistent  with a reversible restrictive pattern, indicative of decreased  left ventricular diastolic compliance and/or increased left  atrial pressure (grade 3 diastolic dysfunction). - Aortic valve: Moderately calcified annulus.  - Left atrium: The atrium was severely dilated. (i.e. Studies not automatically included, echos, thoracentesis, etc; not x-rays)  Consultations:  ACHF team  Discharge Exam: Filed Vitals:   01/16/16 0557 01/16/16 1217  BP: 130/95 98/69  Pulse: 64 72  Temp: 98 F (36.7 C) 98 F (36.7 C)  Resp: 16 18    General: alert orietned in nad Cardiovascular:  s1 s 2no m/r/g Respiratory:  Clear Trace Le edema JVD flat  Discharge Instructions   Discharge Instructions    Diet - low sodium heart healthy    Complete by:  As directed      Discharge instructions    Complete by:  As directed   Take all meds all the time We will try to get u a new cpap machine-follow with Dr. Adrian Blackwater     Heart Failure patients record your daily weight using the same scale at the same time of day    Complete by:  As directed      Increase activity slowly    Complete by:  As directed      STOP any activity that causes chest pain, shortness of breath, dizziness, sweating, or exessive weakness    Complete by:  As directed           Current Discharge Medication List    CONTINUE these medications which have NOT CHANGED   Details  allopurinol (ZYLOPRIM) 100 MG tablet Take 1 tablet (100 mg total) by mouth daily. Qty: 30 tablet, Refills: 2    atorvastatin (LIPITOR) 80 MG tablet TAKE 1 TABLET (80 MG TOTAL) BY MOUTH EVERY EVENING. Qty: 30 tablet, Refills: 6    carvedilol (COREG) 25 MG tablet Take 1 tablet (25 mg total) by mouth 2 (two) times daily with a meal. Qty: 60 tablet, Refills: 6   Associated Diagnoses: Chronic  systolic heart failure (HCC)    hydrALAZINE (APRESOLINE) 25 MG tablet Take 1 tablet (25 mg total) by mouth 3 (three) times daily. Qty: 90 tablet, Refills: 3   Associated Diagnoses: Chronic systolic congestive heart failure (HCC)    insulin NPH-regular Human (NOVOLIN 70/30) (70-30) 100 UNIT/ML injection Inject 34 Units into the skin 2 (two) times daily with a meal. Qty: 4 vial, Refills: 2   Associated Diagnoses: Chronic systolic congestive heart failure (HCC)    isosorbide mononitrate (IMDUR) 30 MG 24 hr tablet Take 1 tablet (30 mg total) by mouth daily. Qty: 30 tablet, Refills: 11   Associated Diagnoses: Chronic systolic congestive heart failure (HCC)    Melatonin 5 MG TABS Take 5 mg by mouth at bedtime as needed.     sacubitril-valsartan (ENTRESTO) 24-26 MG Take 1 tablet by mouth 2 (two) times daily. Qty: 60 tablet, Refills: 11   Associated Diagnoses: Acute on chronic systolic HF (heart failure) (HCC)    spironolactone (ALDACTONE) 25 MG tablet Take 1 tablet (25 mg total) by mouth daily. Qty: 30 tablet, Refills: 6    torsemide (DEMADEX) 20 MG tablet Take 1 tablet (20 mg total) by mouth daily. Qty: 90 tablet, Refills: 3  Allergies  Allergen Reactions  . Aspirin Shortness Of Breath, Itching and Rash     Burning sensation (Patient reports he tolerates other NSAIDS)   . Bee Venom Hives and Swelling  . Lisinopril Cough  . Tomato Rash   Follow-up Information    Follow up with [facility] On 02/04/2016.   Specialty:  Cardiology   Why:  at 0900 for post hospital follow up.  Please bring all of your medications to your visit. The code for parking will be 0002   Contact information:   [address] [id number] [place] [phone]       The results of significant diagnostics from this hospitalization (including imaging, microbiology, ancillary and laboratory) are listed below for reference.     Significant Diagnostic Studies: Dg Chest 2 View  01/14/2016  CLINICAL DATA:  40 year old male with chest pain and shortness of breath since this morning. Initial encounter. EXAM: CHEST  2 VIEW COMPARISON:  None available. FINDINGS: Left anterior chest wall AICD type device. Mild cardiomegaly. Other mediastinal contours are within normal limits. Visualized tracheal air column is within normal limits. Diffuse increased interstitial opacity. No pneumothorax or pleural effusion. No confluent opacity. No acute osseous abnormality identified. IMPRESSION: 1. Diffuse increased interstitial opacity suspicious for interstitial edema, or possibly viral/atypical respiratory infection. 2. No pleural effusion.  Mild cardiomegaly. Electronically Signed   By: Genevie Ann M.D.   On: 01/14/2016 15:56    Microbiology: No results found for this or any previous visit (from the past 240 hour(s)).   Labs: Basic Metabolic Panel:  Recent Labs Lab 01/14/16 1432 01/16/16 0527  NA 139 141  K 4.3 3.6  CL 109 105  CO2 20* 24  GLUCOSE 118* 95  BUN 12 20  CREATININE 1.46* 1.57*  CALCIUM 9.1 9.4   Liver Function Tests: No results for input(s): AST, ALT, ALKPHOS, BILITOT, PROT, ALBUMIN in the last 168 hours. No results for input(s): LIPASE, AMYLASE in the last 168 hours. No results for input(s): AMMONIA in the last 168 hours. CBC:  Recent Labs Lab 01/14/16 1432 01/16/16 0527  WBC 8.6 8.3  HGB 12.8* 14.4  HCT 40.1 43.6  MCV 72.5* 72.1*  PLT 278 282   Cardiac Enzymes: No results for input(s): CKTOTAL, CKMB, CKMBINDEX, TROPONINI in the last 168 hours. BNP: BNP (last 3 results)  Recent Labs  05/14/15 1850 12/25/15 1034 01/14/16 1432  BNP 559.1* 756.3* 988.6*    ProBNP (last 3 results) No results for input(s): PROBNP in the last 8760 hours.  CBG:  Recent Labs Lab 01/15/16 0630 01/15/16 1747 01/15/16 2118 01/16/16 0608 01/16/16 1102  GLUCAP 111* 99 87 98 111*       Signed:  Nita Sells MD   [facility] 01/16/2016, 2:37 PM

## 2016-02-04 ENCOUNTER — Inpatient Hospital Stay (HOSPITAL_COMMUNITY): Admit: 2016-02-04 | Payer: Self-pay

## 2016-02-11 ENCOUNTER — Emergency Department (HOSPITAL_COMMUNITY): Payer: Self-pay

## 2016-02-11 ENCOUNTER — Ambulatory Visit (HOSPITAL_COMMUNITY)
Admission: RE | Admit: 2016-02-11 | Discharge: 2016-02-11 | Disposition: A | Discharge status: Home / Self Care | Payer: Self-pay | Source: Ambulatory Visit | Attending: Cardiology | Admitting: Cardiology

## 2016-02-11 ENCOUNTER — Other Ambulatory Visit: Payer: Self-pay

## 2016-02-11 ENCOUNTER — Other Ambulatory Visit: Payer: Self-pay | Admitting: Family Medicine

## 2016-02-11 ENCOUNTER — Encounter (HOSPITAL_COMMUNITY): Payer: Self-pay | Admitting: Emergency Medicine

## 2016-02-11 ENCOUNTER — Emergency Department (HOSPITAL_COMMUNITY)
Admission: EM | Admit: 2016-02-11 | Discharge: 2016-02-11 | Disposition: A | Discharge status: Home / Self Care | Payer: Self-pay | Attending: Emergency Medicine | Admitting: Emergency Medicine

## 2016-02-11 VITALS — BP 120/90 | HR 73 | Wt 298.2 lb

## 2016-02-11 DIAGNOSIS — R079 Chest pain, unspecified: Secondary | ICD-10-CM | POA: Insufficient documentation

## 2016-02-11 DIAGNOSIS — Z87442 Personal history of urinary calculi: Secondary | ICD-10-CM | POA: Insufficient documentation

## 2016-02-11 DIAGNOSIS — Z9889 Other specified postprocedural states: Secondary | ICD-10-CM | POA: Insufficient documentation

## 2016-02-11 DIAGNOSIS — Z79899 Other long term (current) drug therapy: Secondary | ICD-10-CM | POA: Insufficient documentation

## 2016-02-11 DIAGNOSIS — Z794 Long term (current) use of insulin: Secondary | ICD-10-CM | POA: Insufficient documentation

## 2016-02-11 DIAGNOSIS — I10 Essential (primary) hypertension: Secondary | ICD-10-CM

## 2016-02-11 DIAGNOSIS — N182 Chronic kidney disease, stage 2 (mild): Secondary | ICD-10-CM | POA: Insufficient documentation

## 2016-02-11 DIAGNOSIS — E1122 Type 2 diabetes mellitus with diabetic chronic kidney disease: Secondary | ICD-10-CM | POA: Insufficient documentation

## 2016-02-11 DIAGNOSIS — Z905 Acquired absence of kidney: Secondary | ICD-10-CM | POA: Insufficient documentation

## 2016-02-11 DIAGNOSIS — G4733 Obstructive sleep apnea (adult) (pediatric): Secondary | ICD-10-CM | POA: Insufficient documentation

## 2016-02-11 DIAGNOSIS — I509 Heart failure, unspecified: Secondary | ICD-10-CM

## 2016-02-11 DIAGNOSIS — Z85528 Personal history of other malignant neoplasm of kidney: Secondary | ICD-10-CM | POA: Insufficient documentation

## 2016-02-11 DIAGNOSIS — R42 Dizziness and giddiness: Secondary | ICD-10-CM

## 2016-02-11 DIAGNOSIS — I129 Hypertensive chronic kidney disease with stage 1 through stage 4 chronic kidney disease, or unspecified chronic kidney disease: Secondary | ICD-10-CM | POA: Insufficient documentation

## 2016-02-11 DIAGNOSIS — H8111 Benign paroxysmal vertigo, right ear: Secondary | ICD-10-CM | POA: Insufficient documentation

## 2016-02-11 DIAGNOSIS — J45909 Unspecified asthma, uncomplicated: Secondary | ICD-10-CM | POA: Insufficient documentation

## 2016-02-11 DIAGNOSIS — E7800 Pure hypercholesterolemia, unspecified: Secondary | ICD-10-CM | POA: Insufficient documentation

## 2016-02-11 DIAGNOSIS — E669 Obesity, unspecified: Secondary | ICD-10-CM | POA: Insufficient documentation

## 2016-02-11 DIAGNOSIS — Z87891 Personal history of nicotine dependence: Secondary | ICD-10-CM | POA: Insufficient documentation

## 2016-02-11 DIAGNOSIS — Z95810 Presence of automatic (implantable) cardiac defibrillator: Secondary | ICD-10-CM | POA: Insufficient documentation

## 2016-02-11 DIAGNOSIS — I5022 Chronic systolic (congestive) heart failure: Secondary | ICD-10-CM

## 2016-02-11 DIAGNOSIS — N183 Chronic kidney disease, stage 3 (moderate): Secondary | ICD-10-CM | POA: Insufficient documentation

## 2016-02-11 DIAGNOSIS — I130 Hypertensive heart and chronic kidney disease with heart failure and stage 1 through stage 4 chronic kidney disease, or unspecified chronic kidney disease: Secondary | ICD-10-CM | POA: Insufficient documentation

## 2016-02-11 DIAGNOSIS — Z9981 Dependence on supplemental oxygen: Secondary | ICD-10-CM | POA: Insufficient documentation

## 2016-02-11 DIAGNOSIS — Z7984 Long term (current) use of oral hypoglycemic drugs: Secondary | ICD-10-CM | POA: Insufficient documentation

## 2016-02-11 DIAGNOSIS — Z888 Allergy status to other drugs, medicaments and biological substances status: Secondary | ICD-10-CM | POA: Insufficient documentation

## 2016-02-11 DIAGNOSIS — H5500 Unspecified nystagmus: Secondary | ICD-10-CM | POA: Insufficient documentation

## 2016-02-11 DIAGNOSIS — F419 Anxiety disorder, unspecified: Secondary | ICD-10-CM | POA: Insufficient documentation

## 2016-02-11 DIAGNOSIS — M109 Gout, unspecified: Secondary | ICD-10-CM | POA: Insufficient documentation

## 2016-02-11 LAB — BASIC METABOLIC PANEL
ANION GAP: 9 (ref 5–15)
Anion gap: 14 (ref 5–15)
BUN: 24 mg/dL — ABNORMAL HIGH (ref 6–20)
BUN: 23 mg/dL — ABNORMAL HIGH (ref 6–20)
CO2: 23 mmol/L (ref 22–32)
CO2: 19 mmol/L — ABNORMAL LOW (ref 22–32)
Calcium: 9.7 mg/dL (ref 8.9–10.3)
Calcium: 9.9 mg/dL (ref 8.9–10.3)
Chloride: 107 mmol/L (ref 101–111)
Chloride: 107 mmol/L (ref 101–111)
Creatinine, Ser: 1.74 mg/dL — ABNORMAL HIGH (ref 0.61–1.24)
Creatinine, Ser: 1.64 mg/dL — ABNORMAL HIGH (ref 0.61–1.24)
GFR calc Af Amer: 54 mL/min — ABNORMAL LOW (ref >60)
GFR calc Af Amer: 59 mL/min — ABNORMAL LOW (ref >60)
GFR, EST NON AFRICAN AMERICAN: 47 mL/min — AB (ref >60)
GFR, EST NON AFRICAN AMERICAN: 50 mL/min — AB (ref >60)
Glucose, Bld: 109 mg/dL — ABNORMAL HIGH (ref 65–99)
Glucose, Bld: 144 mg/dL — ABNORMAL HIGH (ref 65–99)
POTASSIUM: 4.8 mmol/L (ref 3.5–5.1)
POTASSIUM: 3.9 mmol/L (ref 3.5–5.1)
SODIUM: 139 mmol/L (ref 135–145)
SODIUM: 140 mmol/L (ref 135–145)

## 2016-02-11 LAB — CBC
HEMATOCRIT: 44.1 % (ref 39.0–52.0)
HEMOGLOBIN: 14.2 g/dL (ref 13.0–17.0)
MCH: 24.1 pg — ABNORMAL LOW (ref 26.0–34.0)
MCHC: 32.2 g/dL (ref 30.0–36.0)
MCV: 74.9 fL — ABNORMAL LOW (ref 78.0–100.0)
Platelets: 269 10*3/uL (ref 150–400)
RBC: 5.89 MIL/uL — ABNORMAL HIGH (ref 4.22–5.81)
RDW: 18.6 % — ABNORMAL HIGH (ref 11.5–15.5)
WBC: 11.9 10*3/uL — AB (ref 4.0–10.5)

## 2016-02-11 LAB — I-STAT TROPONIN, ED: Troponin i, poc: 0 ng/mL (ref 0.00–0.08)

## 2016-02-11 MED ORDER — MECLIZINE HCL 25 MG PO TABS: 25.0000 mg | ORAL_TABLET | Freq: Three times a day (TID) | ORAL | Status: DC | PRN

## 2016-02-11 MED ORDER — DIAZEPAM 5 MG/ML IJ SOLN
5.0000 mg | Freq: Once | INTRAMUSCULAR | Status: AC
Start: 2016-02-11 — End: 2016-02-11
  Administered 2016-02-11: 5 mg via INTRAVENOUS
  Filled 2016-02-11: qty 2

## 2016-02-11 MED ORDER — INSULIN NPH ISOPHANE & REGULAR (70-30) 100 UNIT/ML ~~LOC~~ SUSP: 34.0000 [IU] | Freq: Two times a day (BID) | SUBCUTANEOUS | Status: DC

## 2016-02-11 MED ORDER — DIAZEPAM 5 MG PO TABS: 5.0000 mg | ORAL_TABLET | Freq: Three times a day (TID) | ORAL | Status: DC | PRN

## 2016-02-11 MED ORDER — MECLIZINE HCL 25 MG PO TABS
25.0000 mg | ORAL_TABLET | Freq: Once | ORAL | Status: AC
  Administered 2016-02-11: 25 mg via ORAL
  Filled 2016-02-11: qty 1

## 2016-02-11 MED ORDER — DIAZEPAM 5 MG PO TABS
5.0000 mg | ORAL_TABLET | Freq: Once | ORAL | Status: AC
  Administered 2016-02-11: 5 mg via ORAL
  Filled 2016-02-11: qty 1

## 2016-02-11 NOTE — Progress Notes (Signed)
Advanced Heart Failure Medication Review by a Pharmacist  Does the patient  feel that his/her medications are working for him/her?  yes  Has the patient been experiencing any side effects to the medications prescribed?  no  Does the patient measure his/her own blood pressure or blood glucose at home?  yes   Does the patient have any problems obtaining medications due to transportation or finances?   no  Understanding of regimen: good Understanding of indications: good Potential of compliance: good Patient understands to avoid NSAIDs. Patient understands to avoid decongestants.  Issues to address at subsequent visits: None   Pharmacist comments:  Jeffrey [name] is a pleasant 40 yo M presenting without a medication list. Patient was recently discharged from hospital and all medications have been reviewed.He reports good compliance with his regimen and has been feeling well since being discharged from the hospital recently. He did not have any specific medication-related questions or concerns for me at this time.    Ruta Hinds. Velva Harman, PharmD, BCPS, CPP Clinical Pharmacist Pager: [phone] Phone: [phone] 02/11/2016 10:16 AM   Time with patient: 8 minutes Preparation and documentation time: 2 minutes Total time: 10 minutes

## 2016-02-11 NOTE — ED Notes (Signed)
Patient states "I still feel like the room is spinning after I stand".  Dr. Eulis Foster made aware, see [facility] for medications.  Advised to family to assist patient with ambulation at home until the dizziness subsides but if symptoms worsen to come back to the emergency room.  Patient was able to stand at the bedside with stable ambulation and to the wheelchair with stable gait.  Patients family present at bedside during discharge instructions.  Advised to take the next Meclizine around 7:30 and to take the Valium in 8 hours after administration at 17:00.  Family verbalized understanding.  Patient encourage to rest at home and have family present and aware if further medical attention is needed.

## 2016-02-11 NOTE — ED Notes (Signed)
Patient states "the dizziness has decreased and not as bad as previous".  Patient is sleepy but requested a [place] sandwich.  Sandwich at bedside with the patient.

## 2016-02-11 NOTE — ED Notes (Signed)
Pt states I've been having chest pains, i feel dizzy and i want to vomit, started approx 1 hour ago. PT dx with CHF this morning. Pt denies sob.

## 2016-02-11 NOTE — ED Provider Notes (Signed)
CSN: [id number]     Arrival date & time 02/11/16  1313 History   First MD Initiated Contact with Patient 02/11/16 1408     Chief Complaint  Patient presents with  . Chest Pain     (Consider location/radiation/quality/duration/timing/severity/associated sxs/prior Treatment) HPI Patient had been at a scheduled cardiology follow-up this morning. He reports he was not having symptoms. Upon arriving home, he became very suddenly dizzy a spinning quality. In then developed nausea and multiple episodes of vomiting. Symptoms are made worse by head movement and position movement. No focal weakness numbness or tingling. No visual change. Past Medical History  Diagnosis Date  . Hypertension   . Morbid obesity ([place])   . Presumed NICM     a. 04/2014 Myoview: EF 26%, glob HK, sev glob HK, ? prior infarct;  b. Never cathed 2/2 CKD.  [name] Chronic systolic CHF (congestive heart failure) ([place])     a. EF 20-25% in 2012. b. EF 45-50% in 10/2011 with nonischemic nuc - presumed NICM. c. 12/2014 Echo: Sev depressed LV fxn, sev dil LV, mild LVH, mild MR, sev dil LA, mildly reduced RV fxn.  . Nephrolithiasis   . OSA on CPAP   . High cholesterol   . Childhood asthma   . Type II diabetes mellitus ([place])   . Anxiety   . Renal cell carcinoma ([place])     a. s/p Rt robotic assisted partial converted to radical nephrectomy on 01/2013.  . Troponin level elevated     a. 04/2014, 12/2014: felt due to CHF.  . CKD (chronic kidney disease) stage 2, GFR 60-89 ml/min   . Aspirin allergy    Past Surgical History  Procedure Laterality Date  . Robotic assited partial nephrectomy Right 01/13/2013    Procedure: ROBOTIC ASSITED PARTIAL NEPHRECTOMY CONVERTED TO ROBOTIC ASSISTED RIGHT RADICAL NEPHRECTOMY;  Surgeon: Alexis Frock, MD;  Location: [facility];  Service: Urology;  Laterality: Right;  . Robotic assisted laparoscopic lysis of adhesion  01/13/2013    Procedure: ROBOTIC ASSISTED LAPAROSCOPIC LYSIS OF ADHESION EXTENSIVE;  Surgeon:  Alexis Frock, MD;  Location: [facility];  Service: Urology;;  . Appendectomy  07/2004  . Cardiac catheterization N/A 05/17/2015    Procedure: Right/Left Heart Cath and Coronary Angiography;  Surgeon: Jolaine Artist, MD;  Location: [place] [facility];  Service: Cardiovascular;  Laterality: N/A;  . Ep implantable device N/A 05/17/2015    Procedure: SubQ ICD Implant;  Surgeon: Will Meredith Leeds, MD;  Location: [place] [facility];  Service: Cardiovascular;  Laterality: N/A;   Family History  Problem Relation Age of Onset  . Heart attack Neg Hx   . Stroke Neg Hx   . Hypertension Mother   . Diabetes Maternal Aunt    Social History  Substance Use Topics  . Smoking status: Former Smoker -- 0.25 packs/day for 22 years    Quit date: 04/16/2015  . Smokeless tobacco: Never Used  . Alcohol Use: No     Comment: occ     Review of Systems 10 Systems reviewed and are negative for acute change except as noted in the HPI.    Allergies  Aspirin; Bee venom; Lisinopril; and Tomato  Home Medications   Prior to Admission medications   Medication Sig Start Date End Date Taking? Authorizing Provider  allopurinol (ZYLOPRIM) 100 MG tablet Take 1 tablet (100 mg total) by mouth daily. 05/21/15   Arnoldo Morale, MD  atorvastatin (LIPITOR) 80 MG tablet TAKE 1 TABLET (80 MG TOTAL) BY MOUTH  EVERY EVENING. 12/25/15   Shirley Friar, PA-C  carvedilol (COREG) 25 MG tablet Take 1 tablet (25 mg total) by mouth 2 (two) times daily with a meal. 12/25/15   Shirley Friar, PA-C  diazepam (VALIUM) 5 MG tablet Take 1 tablet (5 mg total) by mouth every 8 (eight) hours as needed (Vertigo. Take if symptoms are not relieved by meclizine (Antivert ) alone.). 02/11/16   [name], MD  hydrALAZINE (APRESOLINE) 25 MG tablet Take 1 tablet (25 mg total) by mouth 3 (three) times daily. 12/25/15   Shirley Friar, PA-C  insulin NPH-regular Human (NOVOLIN 70/30) (70-30) 100 UNIT/ML injection Inject 34 Units into  the skin 2 (two) times daily with a meal. 05/21/15   Arnoldo Morale, MD  isosorbide mononitrate (IMDUR) 30 MG 24 hr tablet Take 1 tablet (30 mg total) by mouth daily. 12/25/15   Shirley Friar, PA-C  meclizine (ANTIVERT) 25 MG tablet Take 1 tablet (25 mg total) by mouth 3 (three) times daily as needed for dizziness (Take meclizine for spinning dizziness. You may also take Valium if your symptoms are not relieved by the meclizine alone.). 02/11/16   [name], MD  Melatonin 5 MG TABS Take 5 mg by mouth at bedtime as needed.     Historical Provider, MD  sacubitril-valsartan (ENTRESTO) 24-26 MG Take 1 tablet by mouth 2 (two) times daily. 09/10/15   Amy D Ninfa Meeker, NP  spironolactone (ALDACTONE) 25 MG tablet Take 1 tablet (25 mg total) by mouth daily. 12/25/15   Shirley Friar, PA-C  torsemide (DEMADEX) 20 MG tablet Take 1 tablet (20 mg total) by mouth daily. 12/25/15   Shirley Friar, PA-C   BP 99/68 mmHg  Pulse 52  Temp(Src) 97.5 F (36.4 C)  Resp 19  Wt 294 lb 8 oz (133.584 kg)  SpO2 96% Physical Exam  Constitutional: He is oriented to person, place, and time.  Patient is obese. He is alert and nontoxic. He does appear uncomfortable nausea and dizziness.  HENT:  Head: Normocephalic and atraumatic.  Right Ear: External ear normal.  Left Ear: External ear normal.  Nose: Nose normal.  Mouth/Throat: Oropharynx is clear and moist.  Eyes: Pupils are equal, round, and reactive to light.  Patient has nystagmus with right lateral gaze most pronounced. Extraocular motions are intact.  Neck: Neck supple. No thyromegaly present.  Cardiovascular: Normal rate, regular rhythm, normal heart sounds and intact distal pulses.   Pulmonary/Chest: Effort normal and breath sounds normal.  Abdominal: Soft. He exhibits no distension. There is no tenderness.  Musculoskeletal: Normal range of motion. He exhibits no edema or tenderness.  Lymphadenopathy:    He has no cervical adenopathy.   Neurological: He is alert and oriented to person, place, and time. No cranial nerve deficit. He exhibits normal muscle tone. Coordination normal.  Skin: Skin is warm and dry.  Psychiatric: He has a normal mood and affect.    ED Course  Procedures (including critical care time) Labs Review Labs Reviewed  BASIC METABOLIC PANEL - Abnormal; Notable for the following:    CO2 19 (*)    Glucose, Bld 144 (*)    BUN 23 (*)    Creatinine, Ser 1.64 (*)    GFR calc non Af Amer 50 (*)    GFR calc Af Amer 59 (*)    All other components within normal limits  CBC - Abnormal; Notable for the following:    WBC 11.9 (*)    RBC 5.89 (*)  MCV 74.9 (*)    MCH 24.1 (*)    RDW 18.6 (*)    All other components within normal limits  I-STAT TROPOININ, ED    Imaging Review Dg Chest 2 View  02/11/2016  CLINICAL DATA:  Chest pain EXAM: CHEST  2 VIEW COMPARISON:  January 14, 2016 FINDINGS: Stimulator tip is anterior to the manubrium, slightly to the left of midline. No edema or consolidation. Heart is borderline enlarged in size with pulmonary vascularity within normal limits. No adenopathy. No pneumothorax. No bone lesions. IMPRESSION: No edema or consolidation.  Stable cardiac prominence. Electronically Signed   By: Lowella Grip III M.D.   On: 02/11/2016 13:42   Ct Head Wo Contrast  02/11/2016  CLINICAL DATA:  Dizziness, nausea and vomiting since 11 a.m. today. EXAM: CT HEAD WITHOUT CONTRAST TECHNIQUE: Contiguous axial images were obtained from the base of the skull through the vertex without intravenous contrast. COMPARISON:  06/30/2012. FINDINGS: Normal appearing cerebral hemispheres and posterior fossa structures. Normal size and position of the ventricles. No intracranial hemorrhage, mass lesion or CT evidence of acute infarction. Unremarkable bones and included paranasal sinuses. IMPRESSION: Normal examination. Electronically Signed   By: Claudie Revering M.D.   On: 02/11/2016 15:29   I have personally  reviewed and evaluated these images and lab results as part of my medical decision-making.   EKG Interpretation   Date/Time:  Tuesday Feb 11 2016 13:18:19 EDT Ventricular Rate:  60 PR Interval:  184 QRS Duration: 96 QT Interval:  454 QTC Calculation: [id number] R Axis:   63 Text Interpretation:  Normal sinus rhythm Biatrial enlargement Nonspecific  T wave abnormality Abnormal ECG Confirmed by Johnney Killian, MD, Jeannie Done [phone])  on 02/11/2016 4:40:45 PM     Recheck: 16:40 spinning quality now resolved with meclizine and Valium. MDM   Final diagnoses:  Vertigo  Benign positional vertigo, right   Patient had acute onset of vertigo with associated nausea and vomiting. No neurologic dysfunction. Symptoms are very positional. The patient has classic lateral my statements more pronounced to the right. The patient has had significant improvement with Valium and meclizine. CT does not identify any acute intracranial anomaly. Patient had seen his cardiology outpatient provider today prior to onset of symptoms. He was asymptomatic at that time and has not been having any acute cardiac dysfunction although he has significant cardiac history. At this time, did not suspect any underlying cardiac dysfunction contributing to the patient's symptoms.    [name], MD 02/11/16 [phone]

## 2016-02-11 NOTE — Patient Instructions (Signed)
Labs today  Your physician recommends that you schedule a follow-up appointment in: 4 weeks In the [facility]   Please follow up with [employer] patient assistance for refills on your entresto [phone]  Do the following things EVERYDAY: 1) Weigh yourself in the morning before breakfast. Write it down and keep it in a log. 2) Take your medicines as prescribed 3) Eat low salt foods-Limit salt (sodium) to 2000 mg per day.  4) Stay as active as you can everyday 5) Limit all fluids for the day to less than 2 liters 6)

## 2016-02-11 NOTE — Progress Notes (Signed)
Patient ID: ZERION THELL, male   DOB: 01-20-1976, 40 y.o.   MRN: [id number] Patient ID: BRALLAN DRATH, male   DOB: July 14, 1976, 40 y.o.   MRN: [id number]     Advanced Heart Failure Clinic Note   Patient Name: Jeffrey [name] Date of Encounter: 02/11/2016  Primary Care Provider:  Angelica Chessman, MD Primary Cardiologist:  Lizbeth Bark, MD  Primary HF Cardiologist: Bensimhon  History: Ms. 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    Admitted to [facility] in 3/16 secondary to atypical chest pain, dyspnea, and mild troponin elevation. Echo during hospitalization shows "severely reduced LV function" - EF not quantified due to poor windows. He was diuresed and was down about 7 pounds at discharge, at 289 on the hospital scale.   Admitted 05/14/15 with CP at rest. CXR looked improved from previous. CP resolved spontaneously after hospitalization and troponin was flat. R/LHC showed normal coronaries and moderately elevated filling pressures with moderately depressed cardiac output. He had been previously seen by EP and approved for ICD with chronic low EF. [place] subcutaneous ICD was implanted 05/17/15.  He was diuresed with IV lasix 40 mg. Discharge weight 290 lb.  Admitted 4/11 through 01/16/16 with volume overload in the setting of missed medications. Diuresed with IV lasix and transitioned to torsemide 20 mg daily. Discharge weight was 293 pounds.   He returns today for post hospital follow up. Overall feeling better. Denies SOB/Orthopnea. Weight at home 295-298 pounds. Taking all medications. Working full time at [employer]. Says he tries to eat at home.   ECHO 01/16/2016 EF 20-25%  Grade III DD Echo 05/07/15.  EF 20-25%, grade II diastolic dysfunction, diffuse hypokinesis, moderate LV dilation, mild LVH, normal RV size with mildly  decreased systolic function.   Echo in 2012 with EF 20-25%.  In 1/13 had nuclear study EF 45-50% suggestive of NICM.   [facility] 05/17/15 RA = 7 RV = 42/3/7 PA = 43/30 (39) PCW = 26 Fick cardiac output/index = 4.9/2.1 PVR = 2.7 WU SVR = 1651 FA sat = 95% PA sat = 65%, 65% Ao = 122/97 (109) LV = 110/31/33 1) Normal coronaries 2) Moderately elevated filling pressures with moderately depressed cardiac output  Labs (6/16): K 4.1, creatinine 1.52, BNP 133 Labs (06/16/15): K 3.9, creatinine 1.9 Labs (06/28/2015) K 4.1 Creatinine 1.59  Labs (09/10/2015) K 3.9 Creatinine 1.62   Past Medical History   Past Medical History  Diagnosis Date  . Hypertension   . Morbid obesity ([place])   . Presumed NICM     a. 04/2014 Myoview: EF 26%, glob HK, sev glob HK, ? prior infarct;  b. Never cathed 2/2 CKD.  [name] Chronic systolic CHF (congestive heart failure) ([place])     a. EF 20-25% in 2012. b. EF 45-50% in 10/2011 with nonischemic nuc - presumed NICM. c. 12/2014 Echo: Sev depressed LV fxn, sev dil LV, mild LVH, mild MR, sev dil LA, mildly reduced RV fxn.  . Nephrolithiasis   . OSA on CPAP   . High cholesterol   . Childhood asthma   . Type II diabetes mellitus ([place])   . Anxiety   . Renal cell carcinoma ([place])     a. s/p Rt robotic assisted partial converted to radical nephrectomy on 01/2013.  . Troponin level elevated  a. 04/2014, 12/2014: felt due to CHF.  . CKD (chronic kidney disease) stage 2, GFR 60-89 ml/min   . Aspirin allergy    Past Surgical History  Procedure Laterality Date  . Robotic assited partial nephrectomy Right 01/13/2013    Procedure: ROBOTIC ASSITED PARTIAL NEPHRECTOMY CONVERTED TO ROBOTIC ASSISTED RIGHT RADICAL NEPHRECTOMY;  Surgeon: Alexis Frock, MD;  [facility];  Service: Urology;  Laterality: Right;  . Robotic assisted laparoscopic lysis of adhesion  01/13/2013    Procedure: ROBOTIC ASSISTED LAPAROSCOPIC LYSIS OF ADHESION EXTENSIVE;  Surgeon: Alexis Frock, MD;  [facility];  Service: Urology;;  . Appendectomy  07/2004  . Cardiac catheterization N/A 05/17/2015    Procedure: Right/Left Heart Cath and Coronary Angiography;  Surgeon: Jolaine Artist, MD;  Location: [place] [facility];  Service: Cardiovascular;  Laterality: N/A;  . Ep implantable device N/A 05/17/2015    Procedure: SubQ ICD Implant;  Surgeon: Will Meredith Leeds, MD;  Location: [place] [facility];  Service: Cardiovascular;  Laterality: N/A;    Allergies  Allergies  Allergen Reactions  . Aspirin Shortness Of Breath, Itching and Rash     Burning sensation (Patient reports he tolerates other NSAIDS)   . Bee Venom Hives and Swelling  . Lisinopril Cough  . Tomato Rash     Home Medications Current Outpatient Prescriptions  Medication Sig Dispense Refill  . allopurinol (ZYLOPRIM) 100 MG tablet Take 1 tablet (100 mg total) by mouth daily. 30 tablet 2  . atorvastatin (LIPITOR) 80 MG tablet TAKE 1 TABLET (80 MG TOTAL) BY MOUTH EVERY EVENING. 30 tablet 6  . carvedilol (COREG) 25 MG tablet Take 1 tablet (25 mg total) by mouth 2 (two) times daily with a meal. 60 tablet 6  . hydrALAZINE (APRESOLINE) 25 MG tablet Take 1 tablet (25 mg total) by mouth 3 (three) times daily. 90 tablet 3  . insulin NPH-regular Human (NOVOLIN 70/30) (70-30) 100 UNIT/ML injection Inject 34 Units into the skin 2 (two) times daily with a meal. 4 vial 2  . isosorbide mononitrate (IMDUR) 30 MG 24 hr tablet Take 1 tablet (30 mg total) by mouth daily. 30 tablet 11  . Melatonin 5 MG TABS Take 5 mg by mouth at bedtime as needed.     . sacubitril-valsartan (ENTRESTO) 24-26 MG Take 1 tablet by mouth 2 (two) times daily. 60 tablet 11  . spironolactone (ALDACTONE) 25 MG tablet Take 1 tablet (25 mg total) by mouth daily. 30 tablet 6  . torsemide (DEMADEX) 20 MG tablet Take 1 tablet (20 mg total) by mouth daily. 90 tablet 3  . [DISCONTINUED] cetirizine (ZYRTEC ALLERGY) 10 MG tablet Take 1 tablet (10 mg total) by mouth daily. 30 tablet 1    No current facility-administered medications for this encounter.   Review of Systems All other systems reviewed and are otherwise negative except as noted above.  Physical Exam  VS:  BP 120/90 mmHg  Pulse 73  Wt 298 lb 3.2 oz (135.263 kg)  SpO2 98% , Body mass index is 46.69 kg/(m^2).   Wt Readings from Last 3 Encounters:  02/11/16 298 lb 3.2 oz (135.263 kg)  01/16/16 293 lb 9.6 oz (133.176 kg)  12/25/15 311 lb 12.8 oz (141.432 kg)    GEN: NAD. Ambulated in the clinic without difficulty.  HEENT: normal. Neck: Supple, JVP 6-7  carotid bruits, or masses. Cardiac: RRR, no murmurs, rubs, or gallops. No clubbing, cyanosis, or edema.  Radials/DP/PT 2+ and equal bilaterally.  Respiratory: CTAB, normal  effort GI: Obese, soft, NT, ND, no HSM. No bruits or masses. +BS MS: no deformity or atrophy. R and LLE no edema.   Skin: warm and dry, no rash. Neuro:  Strength and sensation are intact. Psych: Normal affect.  Assessment & Plan 1.  Chronic systolic congestive heart failure: s/p [employer] subcutaneous ICD implant 05/17/15.  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 Echo 05/07/15 with 20-25% EF.  CMP may be due to HTN.   NYHA class II symptoms. Volume status stable.  Continue torsemide 20 mg daily. Check BMET today.  - Continue entresto 24-26 mg twice a day. - Continue coreg 25 mg bid.  - Continue hydralazine to 25 mg TID and continue Imdur 30 mg daily.  -Continue spironolactone to 25 mg daily  BMET today.  Discussed the importance of medication compliance.  2. Hypertension: Controlled. Did not have meds today.  3. CKD stage III: Follow creatinine closely. Check BMET today  4. OSA: Using CPAP from time to time. Encouraged to use daily.  5. Tobacco use: - Congratulated on continued abstinence. 6. Gout: Continue allopurinol 200 daily for prophylaxis.  7. DM2: On insulin. Following at [facility].  8. R knee pain.: resolved.   9. ? Situational depression- HF  SW for coping mechanisms.  10.Renal Cell Carcinoma - S/P nephrectomy 2014.  [place].  - No further.   12. Obesity - Encouraged to limit portions and increase activity as able.    Check BMET today.   Follow up 4 weeks.   Amy Clegg, PA-C  10:01 AM

## 2016-02-11 NOTE — Discharge Instructions (Signed)
Benign Positional Vertigo Vertigo is the feeling that you or your surroundings are moving when they are not. Benign positional vertigo is the most common form of vertigo. The cause of this condition is not serious (is benign). This condition is triggered by certain movements and positions (is positional). This condition can be dangerous if it occurs while you are doing something that could endanger you or others, such as driving.  CAUSES In many cases, the cause of this condition is not known. It may be caused by a disturbance in an area of the inner ear that helps your brain to sense movement and balance. This disturbance can be caused by a viral infection (labyrinthitis), head injury, or repetitive motion. RISK FACTORS This condition is more likely to develop in:  Women.  People who are 50 years of age or older. SYMPTOMS Symptoms of this condition usually happen when you move your head or your eyes in different directions. Symptoms may start suddenly, and they usually last for less than a minute. Symptoms may include:  Loss of balance and falling.  Feeling like you are spinning or moving.  Feeling like your surroundings are spinning or moving.  Nausea and vomiting.  Blurred vision.  Dizziness.  Involuntary eye movement (nystagmus). Symptoms can be mild and cause only slight annoyance, or they can be severe and interfere with daily life. Episodes of benign positional vertigo may return (recur) over time, and they may be triggered by certain movements. Symptoms may improve over time. DIAGNOSIS This condition is usually diagnosed by medical history and a physical exam of the head, neck, and ears. You may be referred to a health care provider who specializes in ear, nose, and throat (ENT) problems (otolaryngologist) or a provider who specializes in disorders of the nervous system (neurologist). You may have additional testing, including:  MRI.  A CT scan.  Eye movement tests. Your  health care provider may ask you to change positions quickly while he or she watches you for symptoms of benign positional vertigo, such as nystagmus. Eye movement may be tested with an electronystagmogram (ENG), caloric stimulation, the Dix-Hallpike test, or the roll test.  An electroencephalogram (EEG). This records electrical activity in your brain.  Hearing tests. TREATMENT Usually, your health care provider will treat this by moving your head in specific positions to adjust your inner ear back to normal. Surgery may be needed in severe cases, but this is rare. In some cases, benign positional vertigo may resolve on its own in 2-4 weeks. HOME CARE INSTRUCTIONS Safety  Move slowly.Avoid sudden body or head movements.  Avoid driving.  Avoid operating heavy machinery.  Avoid doing any tasks that would be dangerous to you or others if a vertigo episode would occur.  If you have trouble walking or keeping your balance, try using a cane for stability. If you feel dizzy or unstable, sit down right away.  Return to your normal activities as told by your health care provider. Ask your health care provider what activities are safe for you. General Instructions  Take over-the-counter and prescription medicines only as told by your health care provider.  Avoid certain positions or movements as told by your health care provider.  Drink enough fluid to keep your urine clear or pale yellow.  Keep all follow-up visits as told by your health care provider. This is important. SEEK MEDICAL CARE IF:  You have a fever.  Your condition gets worse or you develop new symptoms.  Your family or friends   notice any behavioral changes.  Your nausea or vomiting gets worse.  You have numbness or a "pins and needles" sensation. SEEK IMMEDIATE MEDICAL CARE IF:  You have difficulty speaking or moving.  You are always dizzy.  You faint.  You develop severe headaches.  You have weakness in your  legs or arms.  You have changes in your hearing or vision.  You develop a stiff neck.  You develop sensitivity to light.   This information is not intended to replace advice given to you by your health care provider. Make sure you discuss any questions you have with your health care provider.   Document Released: 06/29/2006 Document Revised: 06/12/2015 Document Reviewed: 01/14/2015 Elsevier Interactive Patient Education 2016 [employer]  

## 2016-02-13 MED FILL — NOVOLIN 70/30 100 UNITS/ML: (70-30) 100 | 30 days supply | Qty: 20 | Fill #2

## 2016-02-20 ENCOUNTER — Other Ambulatory Visit (HOSPITAL_COMMUNITY): Payer: Self-pay

## 2016-02-20 ENCOUNTER — Encounter (HOSPITAL_COMMUNITY): Payer: Self-pay | Admitting: Internal Medicine

## 2016-03-18 MED FILL — ISOSORBIDE MN ER 30 MG TAB: 30 | 30 days supply | Qty: 30 | Fill #1

## 2016-03-18 MED FILL — ?SPIRONOLACTONE 25 MG TABLE: 25 | 30 days supply | Qty: 30 | Fill #1

## 2016-03-24 ENCOUNTER — Ambulatory Visit (HOSPITAL_COMMUNITY)
Admission: RE | Admit: 2016-03-24 | Discharge: 2016-03-24 | Disposition: A | Discharge status: Home / Self Care | Payer: Self-pay | Source: Ambulatory Visit | Attending: Internal Medicine | Admitting: Internal Medicine

## 2016-03-24 VITALS — BP 158/118 | HR 73 | Wt 302.0 lb

## 2016-03-24 DIAGNOSIS — Z888 Allergy status to other drugs, medicaments and biological substances status: Secondary | ICD-10-CM | POA: Insufficient documentation

## 2016-03-24 DIAGNOSIS — I10 Essential (primary) hypertension: Secondary | ICD-10-CM

## 2016-03-24 DIAGNOSIS — N183 Chronic kidney disease, stage 3 (moderate): Secondary | ICD-10-CM | POA: Insufficient documentation

## 2016-03-24 DIAGNOSIS — Z905 Acquired absence of kidney: Secondary | ICD-10-CM | POA: Insufficient documentation

## 2016-03-24 DIAGNOSIS — Z95810 Presence of automatic (implantable) cardiac defibrillator: Secondary | ICD-10-CM | POA: Insufficient documentation

## 2016-03-24 DIAGNOSIS — I5022 Chronic systolic (congestive) heart failure: Secondary | ICD-10-CM | POA: Insufficient documentation

## 2016-03-24 DIAGNOSIS — I6601 Occlusion and stenosis of right middle cerebral artery: Secondary | ICD-10-CM | POA: Insufficient documentation

## 2016-03-24 DIAGNOSIS — Z79899 Other long term (current) drug therapy: Secondary | ICD-10-CM | POA: Insufficient documentation

## 2016-03-24 DIAGNOSIS — Z85528 Personal history of other malignant neoplasm of kidney: Secondary | ICD-10-CM | POA: Insufficient documentation

## 2016-03-24 DIAGNOSIS — I130 Hypertensive heart and chronic kidney disease with heart failure and stage 1 through stage 4 chronic kidney disease, or unspecified chronic kidney disease: Secondary | ICD-10-CM | POA: Insufficient documentation

## 2016-03-24 DIAGNOSIS — Z6841 Body Mass Index (BMI) 40.0 and over, adult: Secondary | ICD-10-CM | POA: Insufficient documentation

## 2016-03-24 DIAGNOSIS — E7800 Pure hypercholesterolemia, unspecified: Secondary | ICD-10-CM | POA: Insufficient documentation

## 2016-03-24 DIAGNOSIS — E1122 Type 2 diabetes mellitus with diabetic chronic kidney disease: Secondary | ICD-10-CM | POA: Insufficient documentation

## 2016-03-24 DIAGNOSIS — Z794 Long term (current) use of insulin: Secondary | ICD-10-CM | POA: Insufficient documentation

## 2016-03-24 DIAGNOSIS — G4733 Obstructive sleep apnea (adult) (pediatric): Secondary | ICD-10-CM | POA: Insufficient documentation

## 2016-03-24 DIAGNOSIS — M109 Gout, unspecified: Secondary | ICD-10-CM | POA: Insufficient documentation

## 2016-03-24 DIAGNOSIS — Z87891 Personal history of nicotine dependence: Secondary | ICD-10-CM | POA: Insufficient documentation

## 2016-03-24 MED FILL — TORSEMIDE 20 MG TABLET: 20 | 30 days supply | Qty: 30 | Fill #1

## 2016-03-24 NOTE — Patient Instructions (Signed)
Your physician recommends that you schedule a follow-up appointment in: 3 months  In the [facility]   Do the following things EVERYDAY: 1) Weigh yourself in the morning before breakfast. Write it down and keep it in a log. 2) Take your medicines as prescribed 3) Eat low salt foods-Limit salt (sodium) to 2000 mg per day.  4) Stay as active as you can everyday 5) Limit all fluids for the day to less than 2 liters 6)   

## 2016-03-24 NOTE — Progress Notes (Signed)
Advanced Heart Failure Medication Review by a Pharmacist  Does the patient  feel that his/her medications are working for him/her?  yes  Has the patient been experiencing any side effects to the medications prescribed?  no  Does the patient measure his/her own blood pressure or blood glucose at home?  Yes, cannot remember exact values but states that it is "normal'".  Does the patient have any problems obtaining medications due to transportation or finances?   Yes, states entresto is supposed to be delivered to him home but said the company hasnt contacted him yet.   Understanding of regimen: good Understanding of indications: good Potential of compliance: good Patient understands to avoid NSAIDs. Patient understands to avoid decongestants.  Issues to address at subsequent visits: Entresto coverage    Pharmacist comments: Mr. Gronemeyer is a pleasant 18 yom presenting to clinic for a follow-up appointment. He is not currently experiencing any medication-related side effects but has admitted to missing his last few doses of medications. He also states that he is out of his entresto. Also states that he was supposed to be getting the entresto shipped to his house but has not gotten it. Ileene Patrick, PharmD previously had him approved via the [employer] co-pay assistance program. Gave patient the number to call to verify shipping information.   Patient also with recent weight gain since previous appointment last month (~9 lbs). Patient denies any fluid overload and states that he runs 1 mile and jogs 1 mile daily. He did not have any additional medication-related questions or concerns at this time.   Amaris Delafuente C. Lennox Grumbles, PharmD Pharmacy Resident  Pager: [phone] 03/24/2016 10:09 AM   Time with patient: 10 min  Preparation and documentation time: 6 min  Total time: 16 min

## 2016-03-24 NOTE — Progress Notes (Signed)
Patient ID: Jeffrey [name], male   DOB: [birth year], 40 y.o.   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    Admitted to [facility] in 3/16 secondary to atypical chest pain, dyspnea, and mild troponin elevation. Echo during hospitalization shows "severely reduced LV function" - EF not quantified due to poor windows. He was diuresed and was down about 7 pounds at discharge, at 289 on the hospital scale.   Admitted 05/14/15 with CP at rest. CXR looked improved from previous. CP resolved spontaneously after hospitalization and troponin was flat. R/LHC showed normal coronaries and moderately elevated filling pressures with moderately depressed cardiac output. He had been previously seen by EP and approved for ICD with chronic low EF. [place] subcutaneous ICD was implanted 05/17/15.  He was diuresed with IV lasix 40 mg. Discharge weight 290 lb.  Admitted 4/11 through 01/16/16 with volume overload in the setting of missed medications. Diuresed with IV lasix and transitioned to torsemide 20 mg daily. Discharge weight was 293 pounds.   He returns today for follow up. Overall feeling better. Denies SOB/Orthopnea. Has been out of entresto and torsemide He is now able to walks 1 mile and runs 1 mile most days.  Weight at home 295-298 pounds. Using CPAP at night. Taking all medications. Working full time at [employer]. Says he tries to eat at home. Medicaid pending. Does not have insurance.   ECHO 01/16/2016 EF 20-25%  Grade III DD Echo 05/07/15.  EF 20-25%, grade II diastolic dysfunction, diffuse  hypokinesis, moderate LV dilation, mild LVH, normal RV size with mildly decreased systolic function.   Echo in 2012 with EF 20-25%.  In 1/13 had nuclear study EF 45-50% suggestive of NICM.   [facility] 05/17/15 RA = 7 RV = 42/3/7 PA = 43/30 (39) PCW = 26 Fick cardiac output/index = 4.9/2.1 PVR = 2.7 WU SVR = 1651 FA sat = 95% PA sat = 65%, 65% Ao = 122/97 (109) LV = 110/31/33 1) Normal coronaries 2) Moderately elevated filling pressures with moderately depressed cardiac output  Labs (6/16): K 4.1, creatinine 1.52, BNP 133 Labs (06/16/15): K 3.9, creatinine 1.9 Labs (06/28/2015) K 4.1 Creatinine 1.59  Labs (09/10/2015) K 3.9 Creatinine 1.62  Labs (02/11/2016)  K 3.9 Creatinine 1.64    Past Medical History   Past Medical History  Diagnosis Date  . Hypertension   . Morbid obesity ([place])   . Presumed NICM     a. 04/2014 Myoview: EF 26%, glob HK, sev glob HK, ? prior infarct;  b. Never cathed 2/2 CKD.  [name] Chronic systolic CHF (congestive heart failure) ([place])     a. EF 20-25% in 2012. b. EF 45-50% in 10/2011 with nonischemic nuc - presumed NICM. c. 12/2014 Echo: Sev depressed LV fxn, sev dil LV, mild LVH, mild MR, sev dil LA, mildly reduced RV fxn.  . Nephrolithiasis   . OSA on CPAP   . High cholesterol   . Childhood asthma   . Type II diabetes mellitus ([place])   . Anxiety   . Renal cell carcinoma ([place])  a. s/p Rt robotic assisted partial converted to radical nephrectomy on 01/2013.  . Troponin level elevated     a. 04/2014, 12/2014: felt due to CHF.  . CKD (chronic kidney disease) stage 2, GFR 60-89 ml/min   . Aspirin allergy    Past Surgical History  Procedure Laterality Date  . Robotic assited partial nephrectomy Right 01/13/2013    Procedure: ROBOTIC ASSITED PARTIAL NEPHRECTOMY CONVERTED TO ROBOTIC ASSISTED RIGHT RADICAL NEPHRECTOMY;  Surgeon: Alexis Frock, MD;  Location: [facility];  Service: Urology;  Laterality: Right;  . Robotic assisted laparoscopic lysis of adhesion  01/13/2013     Procedure: ROBOTIC ASSISTED LAPAROSCOPIC LYSIS OF ADHESION EXTENSIVE;  Surgeon: Alexis Frock, MD;  Location: [facility];  Service: Urology;;  . Appendectomy  07/2004  . Cardiac catheterization N/A 05/17/2015    Procedure: Right/Left Heart Cath and Coronary Angiography;  Surgeon: Jolaine Artist, MD;  Location: [place] [facility];  Service: Cardiovascular;  Laterality: N/A;  . Ep implantable device N/A 05/17/2015    Procedure: SubQ ICD Implant;  Surgeon: Will Meredith Leeds, MD;  Location: [facility];  Service: Cardiovascular;  Laterality: N/A;    Allergies  Allergies  Allergen Reactions  . Aspirin Shortness Of Breath, Itching and Rash     Burning sensation (Patient reports he tolerates other NSAIDS)   . Bee Venom Hives and Swelling  . Lisinopril Cough  . Tomato Rash     Home Medications Current Outpatient Prescriptions  Medication Sig Dispense Refill  . allopurinol (ZYLOPRIM) 100 MG tablet Take 1 tablet (100 mg total) by mouth daily. 30 tablet 2  . atorvastatin (LIPITOR) 80 MG tablet TAKE 1 TABLET (80 MG TOTAL) BY MOUTH EVERY EVENING. 30 tablet 6  . carvedilol (COREG) 25 MG tablet Take 1 tablet (25 mg total) by mouth 2 (two) times daily with a meal. 60 tablet 6  . diazepam (VALIUM) 5 MG tablet Take 1 tablet (5 mg total) by mouth every 8 (eight) hours as needed (Vertigo. Take if symptoms are not relieved by meclizine (Antivert ) alone.). 20 tablet 0  . hydrALAZINE (APRESOLINE) 25 MG tablet Take 1 tablet (25 mg total) by mouth 3 (three) times daily. 90 tablet 3  . insulin NPH-regular Human (NOVOLIN 70/30) (70-30) 100 UNIT/ML injection Inject 34 Units into the skin 2 (two) times daily with a meal. 4 vial 1  . isosorbide mononitrate (IMDUR) 30 MG 24 hr tablet Take 1 tablet (30 mg total) by mouth daily. 30 tablet 11  . meclizine (ANTIVERT) 25 MG tablet Take 1 tablet (25 mg total) by mouth 3 (three) times daily as needed for dizziness (Take meclizine for spinning dizziness. You may  also take Valium if your symptoms are not relieved by the meclizine alone.). 30 tablet 0  . sacubitril-valsartan (ENTRESTO) 24-26 MG Take 1 tablet by mouth 2 (two) times daily. 60 tablet 11  . spironolactone (ALDACTONE) 25 MG tablet Take 1 tablet (25 mg total) by mouth daily. 30 tablet 6  . torsemide (DEMADEX) 20 MG tablet Take 1 tablet (20 mg total) by mouth daily. 90 tablet 3  . [DISCONTINUED] cetirizine (ZYRTEC ALLERGY) 10 MG tablet Take 1 tablet (10 mg total) by mouth daily. 30 tablet 1   No current facility-administered medications for this encounter.   Review of Systems All other systems reviewed and are otherwise negative except as noted above.  Physical Exam  VS:  BP 158/118 mmHg  Pulse 73  Wt 302 lb (136.986 kg)  SpO2 97% ,  Body mass index is 47.29 kg/(m^2).   Wt Readings from Last 3 Encounters:  03/24/16 302 lb (136.986 kg)  02/11/16 294 lb 8 oz (133.584 kg)  02/11/16 298 lb 3.2 oz (135.263 kg)    GEN: NAD. Ambulated in the clinic without difficulty.  HEENT: normal. Neck: Supple, JVP 6-7  carotid bruits, or masses. Cardiac: RRR, no murmurs, rubs, or gallops. No clubbing, cyanosis, or edema.  Radials/DP/PT 2+ and equal bilaterally.  Respiratory: CTAB, normal effort GI: Obese, soft, NT, ND, no HSM. No bruits or masses. +BS MS: no deformity or atrophy. R and LLE no edema.   Skin: warm and dry, no rash. Neuro:  Strength and sensation are intact. Psych: Normal affect.  Assessment & Plan 1.  Chronic systolic congestive heart failure: s/p [employer] subcutaneous ICD implant 05/17/15.  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 Echo 01/2016  EF 20-25%.  NYHA class II symptoms. Doing great. Able to walk a mile. Volume status ok. Continue torsemide 20 mg daily.   - Continue entresto 24-26 mg twice a day. Restart today. Give 30 day free card for entresto.  - Continue coreg 25 mg bid.  - Continue hydralazine to 25 mg TID and continue Imdur 30 mg daily.  -Continue  spironolactone to 25 mg daily  Discussed the importance of medication compliance.  2. Hypertension: Controlled. Did not have meds today.  3. CKD stage III: Follow creatinine closely.  4. OSA: Using CPAP from time to time. Encouraged to use daily.  5. Tobacco use: - Congratulated on continued abstinence. 6. Gout: Continue allopurinol 200 daily for prophylaxis.  7. DM2: On insulin. Following at [facility].  8. R knee pain.: resolved.   9. ? Situational depression- resolved.   10.Renal Cell Carcinoma - S/P nephrectomy 2014.  [place].  - No further.   12. Obesity - Encouraged to limit portions and increase activity as able.    Follow up 3 months . Needs follow up with PCP.   Meily Glowacki Ninfa Meeker, NP-C  9:59 AM

## 2016-04-01 ENCOUNTER — Other Ambulatory Visit (HOSPITAL_COMMUNITY): Payer: Self-pay | Admitting: Cardiology

## 2016-04-01 DIAGNOSIS — I5023 Acute on chronic systolic (congestive) heart failure: Secondary | ICD-10-CM

## 2016-04-01 MED ORDER — SACUBITRIL-VALSARTAN 24-26 MG PO TABS: 1.0000 | ORAL_TABLET | Freq: Two times a day (BID) | ORAL | Status: DC

## 2016-04-29 ENCOUNTER — Other Ambulatory Visit: Payer: Self-pay | Admitting: Internal Medicine

## 2016-04-30 NOTE — Telephone Encounter (Signed)
Please advise. Thank you

## 2016-05-07 ENCOUNTER — Encounter (HOSPITAL_COMMUNITY): Payer: Self-pay | Admitting: Family Medicine

## 2016-05-07 ENCOUNTER — Emergency Department (HOSPITAL_COMMUNITY)
Admission: EM | Admit: 2016-05-07 | Discharge: 2016-05-07 | Disposition: A | Discharge status: Home / Self Care | Payer: Self-pay | Attending: Emergency Medicine | Admitting: Emergency Medicine

## 2016-05-07 DIAGNOSIS — Z87891 Personal history of nicotine dependence: Secondary | ICD-10-CM | POA: Insufficient documentation

## 2016-05-07 DIAGNOSIS — I130 Hypertensive heart and chronic kidney disease with heart failure and stage 1 through stage 4 chronic kidney disease, or unspecified chronic kidney disease: Secondary | ICD-10-CM | POA: Insufficient documentation

## 2016-05-07 DIAGNOSIS — I5022 Chronic systolic (congestive) heart failure: Secondary | ICD-10-CM | POA: Insufficient documentation

## 2016-05-07 DIAGNOSIS — Z79899 Other long term (current) drug therapy: Secondary | ICD-10-CM | POA: Insufficient documentation

## 2016-05-07 DIAGNOSIS — M109 Gout, unspecified: Secondary | ICD-10-CM | POA: Insufficient documentation

## 2016-05-07 DIAGNOSIS — E1122 Type 2 diabetes mellitus with diabetic chronic kidney disease: Secondary | ICD-10-CM | POA: Insufficient documentation

## 2016-05-07 DIAGNOSIS — Z794 Long term (current) use of insulin: Secondary | ICD-10-CM | POA: Insufficient documentation

## 2016-05-07 DIAGNOSIS — N182 Chronic kidney disease, stage 2 (mild): Secondary | ICD-10-CM | POA: Insufficient documentation

## 2016-05-07 LAB — SYNOVIAL CELL COUNT + DIFF, W/ CRYSTALS
Eosinophils-Synovial: 0 % (ref 0–1)
Lymphocytes-Synovial Fld: 1 % (ref 0–20)
MONOCYTE-MACROPHAGE-SYNOVIAL FLUID: 6 % — AB (ref 50–90)
NEUTROPHIL, SYNOVIAL: 93 % — AB (ref 0–25)
WBC, SYNOVIAL: 50500 /mm3 — AB (ref 0–200)

## 2016-05-07 LAB — CBG MONITORING, ED: Glucose-Capillary: 148 mg/dL — ABNORMAL HIGH (ref 65–99)

## 2016-05-07 MED ORDER — COLCHICINE 0.6 MG PO TABS: 0.6000 mg | ORAL_TABLET | Freq: Two times a day (BID) | ORAL | 0 refills | Status: DC

## 2016-05-07 MED ORDER — HYDROCODONE-ACETAMINOPHEN 5-325 MG PO TABS
2.0000 | ORAL_TABLET | Freq: Once | ORAL | Status: AC
  Administered 2016-05-07: 2 via ORAL
  Filled 2016-05-07: qty 2

## 2016-05-07 MED ORDER — LIDOCAINE-EPINEPHRINE (PF) 2 %-1:200000 IJ SOLN
10.0000 mL | Freq: Once | INTRAMUSCULAR | Status: AC
  Administered 2016-05-07: 10 mL via INTRADERMAL
  Filled 2016-05-07: qty 20

## 2016-05-07 MED ORDER — OXYCODONE-ACETAMINOPHEN 5-325 MG PO TABS: 2.0000 | ORAL_TABLET | Freq: Four times a day (QID) | ORAL | 0 refills | Status: DC | PRN

## 2016-05-07 NOTE — ED Triage Notes (Signed)
Pt here for gout flare up in right elbow since Monday. sts that he takes allopurinol but out of colchicine.

## 2016-05-07 NOTE — Discharge Instructions (Signed)
Hold your LIPITOR while taking COLCHICINE.

## 2016-05-07 NOTE — ED Provider Notes (Signed)
[facility] DEPT Provider Note   CSN: [id number] Arrival date & time: 05/07/16  1345  First Provider Contact: 05/07/2016 2:16 PM   By signing my name below, I, Jasmyn B. Alexander, attest that this documentation has been prepared under the direction and in the presence of [name], PA-C. Electronically Signed: Tedra Coupe. Sheppard Coil, ED Scribe. 05/07/16. 2:23 PM.  History   Chief Complaint Chief Complaint  Patient presents with  . Gout    HPI HPI Comments: Jeffrey [name] is a 40 y.o. male with PMHx of CHF, CKD, HTN, DM, and HLD who presents to the Emergency Department complaining of a gradual onset, constant, moderate right-elbow pain due to a Gout flare-up x 3 days. He reports that pain is similar to past episodes which usually occur in his ankles and occasionally in his elbows. Pt has taken Ibuprofen with mild relief of pain. Denies any recent injury to the elbow. Pain is exacerbated with extension of the elbow. No injection drugs used for symptoms. No IV Drug abuse. Denies any numbness or weakness.  The history is provided by the patient. No language interpreter was used.    Past Medical History:  Diagnosis Date  . Anxiety   . Aspirin allergy   . Childhood asthma   . Chronic systolic CHF (congestive heart failure) ([place])    a. EF 20-25% in 2012. b. EF 45-50% in 10/2011 with nonischemic nuc - presumed NICM. c. 12/2014 Echo: Sev depressed LV fxn, sev dil LV, mild LVH, mild MR, sev dil LA, mildly reduced RV fxn.  . CKD (chronic kidney disease) stage 2, GFR 60-89 ml/min   . High cholesterol   . Hypertension   . Morbid obesity ([place])   . Nephrolithiasis   . OSA on CPAP   . Presumed NICM    a. 04/2014 Myoview: EF 26%, glob HK, sev glob HK, ? prior infarct;  b. Never cathed 2/2 CKD.  [name] Renal cell carcinoma ([place])    a. s/p Rt robotic assisted partial converted to radical nephrectomy on 01/2013.  . Troponin level elevated    a. 04/2014, 12/2014: felt due to CHF.  . Type II diabetes  [facility])     Patient Active Problem List   Diagnosis Date Noted  . CHF exacerbation ([place]) 01/15/2016  . Hematochezia 11/08/2015  . CKD (chronic kidney disease), stage III 06/20/2015  . Cardiac device in situ, other   . Pain in the chest   . Acute on chronic systolic HF (heart failure) ([place])   . Chronic systolic heart failure ([place]) 03/11/2015  . Gout 03/11/2015  . Chest pain 12/28/2014  . CHF (congestive heart failure), NYHA class I ([place])   . Acute-on-chronic kidney injury ([place]) 04/08/2014  . Elevated troponin I level 04/08/2014  . Aspirin allergy 04/08/2014  . Renal cell carcinoma ([place])   . Acute on chronic systolic CHF (congestive heart failure) ([place]) 04/05/2014  . Accelerated hypertension 04/05/2014  . DM (diabetes mellitus) ([place]) 04/05/2014  . Gout attack 11/10/2011  . Morbid obesity ([place]) 10/23/2011  . OSA (obstructive sleep apnea) 10/23/2011  . Diabetes mellitus type 2, uncontrolled ([place]) 10/22/2011  . [place], MILD 12/25/2010  . HYPERTENSION, BENIGN ESSENTIAL 12/25/2010    Past Surgical History:  Procedure Laterality Date  . APPENDECTOMY  07/2004  . CARDIAC CATHETERIZATION N/A 05/17/2015   Procedure: Right/Left Heart Cath and Coronary Angiography;  Surgeon: Jolaine Artist, MD;  Location: [place] CV LAB;  Service: Cardiovascular;  Laterality: N/A;  . EP IMPLANTABLE DEVICE  N/A 05/17/2015   Procedure: SubQ ICD Implant;  Surgeon: Will Meredith Leeds, MD;  Location: [place] [facility] LAB;  Service: Cardiovascular;  Laterality: N/A;  . ROBOTIC ASSISTED LAPAROSCOPIC LYSIS OF ADHESION  01/13/2013   Procedure: ROBOTIC ASSISTED LAPAROSCOPIC LYSIS OF ADHESION EXTENSIVE;  Surgeon: Alexis Frock, MD;  [facility];  Service: Urology;;  . ROBOTIC ASSITED PARTIAL NEPHRECTOMY Right 01/13/2013   Procedure: ROBOTIC ASSITED PARTIAL NEPHRECTOMY CONVERTED TO ROBOTIC ASSISTED RIGHT RADICAL NEPHRECTOMY;  Surgeon: Alexis Frock, MD;  [facility];  Service: Urology;   Laterality: Right;       Home Medications    Prior to Admission medications   Medication Sig Start Date End Date Taking? Authorizing Provider  allopurinol (ZYLOPRIM) 100 MG tablet Take 1 tablet (100 mg total) by mouth daily. 04/30/16   Jolaine Artist, MD  atorvastatin (LIPITOR) 80 MG tablet TAKE 1 TABLET (80 MG TOTAL) BY MOUTH EVERY EVENING. 12/25/15   Shirley Friar, PA-C  carvedilol (COREG) 25 MG tablet Take 1 tablet (25 mg total) by mouth 2 (two) times daily with a meal. 12/25/15   Shirley Friar, PA-C  diazepam (VALIUM) 5 MG tablet Take 1 tablet (5 mg total) by mouth every 8 (eight) hours as needed (Vertigo. Take if symptoms are not relieved by meclizine (Antivert ) alone.). 02/11/16   [name], MD  hydrALAZINE (APRESOLINE) 25 MG tablet Take 1 tablet (25 mg total) by mouth 3 (three) times daily. 12/25/15   Shirley Friar, PA-C  insulin NPH-regular Human (NOVOLIN 70/30) (70-30) 100 UNIT/ML injection Inject 34 Units into the skin 2 (two) times daily with a meal. 02/11/16   Arnoldo Morale, MD  isosorbide mononitrate (IMDUR) 30 MG 24 hr tablet Take 1 tablet (30 mg total) by mouth daily. 12/25/15   Shirley Friar, PA-C  meclizine (ANTIVERT) 25 MG tablet Take 1 tablet (25 mg total) by mouth 3 (three) times daily as needed for dizziness (Take meclizine for spinning dizziness. You may also take Valium if your symptoms are not relieved by the meclizine alone.). 02/11/16   [name], MD  sacubitril-valsartan (ENTRESTO) 24-26 MG Take 1 tablet by mouth 2 (two) times daily. 04/01/16   Amy D Ninfa Meeker, NP  spironolactone (ALDACTONE) 25 MG tablet Take 1 tablet (25 mg total) by mouth daily. 12/25/15   Shirley Friar, PA-C  torsemide (DEMADEX) 20 MG tablet Take 1 tablet (20 mg total) by mouth daily. 12/25/15   Shirley Friar, PA-C    Family History Family History  Problem Relation Age of Onset  . Hypertension Mother   . Diabetes Maternal Aunt   . Heart attack  Neg Hx   . Stroke Neg Hx     Social History Social History  Substance Use Topics  . Smoking status: Former Smoker    Packs/day: 0.25    Years: 22.00    Quit date: 04/16/2015  . Smokeless tobacco: Never Used  . Alcohol use No     Comment: occ      Allergies   Aspirin; Bee venom; Lisinopril; and Tomato   Review of Systems Review of Systems  Musculoskeletal: Positive for arthralgias and myalgias.  Neurological: Negative for weakness and numbness.  All other systems reviewed and are negative.  Physical Exam Updated Vital Signs BP 127/90 (BP Location: Left Arm)   Pulse 78   Temp 98.1 F (36.7 C) (Oral)   Resp 16   SpO2 98%   Physical Exam Physical Exam  Constitutional: Pt appears well-developed  and well-nourished. No distress.  HENT:  Head: Normocephalic and atraumatic.  Eyes: Conjunctivae are normal.  Neck: Normal range of motion.  Cardiovascular: Normal rate, regular rhythm and intact distal pulses.   Capillary refill < 3 sec  Pulmonary/Chest: Effort normal and breath sounds normal.  Musculoskeletal: Pt exhibits tenderness to palpation over right elbow diffusely, no erythema, no abscess. Pt exhibits no edema.  ROM: Right elbow 0/5 limited by pain  Neurological: Pt  is alert. Coordination normal.  Sensation 5/5 Strength 0/5 limited by pain  Skin: Skin is warm and dry. Pt is not diaphoretic.  No tenting of the skin  Psychiatric: Pt has a normal mood and affect.  Nursing note and vitals reviewed.   ED Treatments / Results  DIAGNOSTIC STUDIES: Oxygen Saturation is 98% on RA, normal by my interpretation.    COORDINATION OF CARE: 2:22 PM-Discussed treatment plan which includes order of Norco with pt at bedside and pt agreed to plan.   Labs (all labs ordered are listed, but only abnormal results are displayed) Labs Reviewed - No data to display  EKG  EKG Interpretation None       Radiology No results found.  Procedures Procedures (including critical  care time)  Medications Ordered in ED Medications  HYDROcodone-acetaminophen (NORCO/VICODIN) 5-325 MG per tablet 2 tablet (not administered)   Initial Impression / Assessment and Plan / ED Course  I have reviewed the triage vital signs and the nursing notes.  Pertinent labs & imaging results that were available during my care of the patient were reviewed by me and considered in my medical decision making (see chart for details).  Clinical Course    Patient with suspected gout of left elbow.  Seen by and discussed with Dr. Ellender Hose.  Elbow aspiration by Dr. Ellender Hose.  Fluid consistent with gout.  Reviewed and discussed with Dr. Ellender Hose.  DC home with colchicine (which patient normally takes for gout attacks) and percocet.  Final Clinical Impressions(s) / ED Diagnoses   Final diagnoses:  Acute gout of right elbow, unspecified cause    New Prescriptions New Prescriptions   COLCHICINE 0.6 MG TABLET    Take 1 tablet (0.6 mg total) by mouth 2 (two) times daily.   OXYCODONE-ACETAMINOPHEN (PERCOCET/ROXICET) 5-325 MG TABLET    Take 2 tablets by mouth every 6 (six) hours as needed for severe pain.   I personally performed the services described in this documentation, which was scribed in my presence. The recorded information has been reviewed and is accurate.       [name], PA-C 05/07/16 1646    Duffy Bruce, MD 05/07/16 2118

## 2016-05-07 NOTE — ED Notes (Signed)
CBG results given to [employer] PA

## 2016-05-08 LAB — MISC LABCORP TEST (SEND OUT): Labcorp test code: 19497

## 2016-05-10 LAB — BODY FLUID CULTURE: CULTURE: NO GROWTH

## 2016-07-14 MED FILL — ISOSORBIDE MN ER 30 MG TAB: 30 | 30 days supply | Qty: 30 | Fill #2

## 2016-07-14 MED FILL — ?SPIRONOLACTONE 25 MG TABLE: 25 MG | 30 days supply | Qty: 30 | Fill #2

## 2016-07-14 MED FILL — CARVEDILOL 25 MG TABLET: 25 | 30 days supply | Qty: 60 | Fill #1

## 2016-07-14 MED FILL — TORSEMIDE 20 MG TABLET: 20 | 30 days supply | Qty: 30 | Fill #2

## 2016-07-14 MED FILL — hydrALAZINE HCL 25 MG TABS: 25 | 30 days supply | Qty: 90 | Fill #1

## 2016-09-16 MED FILL — ?SPIRONOLACTONE 25 MG TABLE: 25 MG | 30 days supply | Qty: 30 | Fill #3

## 2016-09-16 MED FILL — ISOSORBIDE MN ER 30 MG TAB: 30 | 30 days supply | Qty: 30 | Fill #3

## 2016-09-16 MED FILL — hydrALAZINE HCL 25 MG TABS: 25 | 30 days supply | Qty: 90 | Fill #2

## 2016-09-16 MED FILL — ATORVASTATIN 80 MG TABLET: 80 | 30 days supply | Qty: 30 | Fill #1

## 2016-09-16 MED FILL — CARVEDILOL 25 MG TABLET: 25 | 30 days supply | Qty: 60 | Fill #2

## 2016-09-16 MED FILL — TORSEMIDE 20 MG TABLET: 20 | 30 days supply | Qty: 30 | Fill #3

## 2016-09-18 MED FILL — !NOVOLIN 70/30 100 UNITS/ML: (70-30) 100 | 14 days supply | Qty: 10 | Fill #0

## 2016-10-06 ENCOUNTER — Emergency Department (HOSPITAL_COMMUNITY)
Admission: EM | Admit: 2016-10-06 | Discharge: 2016-10-06 | Disposition: A | Discharge status: Home / Self Care | Payer: Self-pay | Attending: Emergency Medicine | Admitting: Emergency Medicine

## 2016-10-06 ENCOUNTER — Encounter (HOSPITAL_COMMUNITY): Payer: Self-pay

## 2016-10-06 ENCOUNTER — Emergency Department (HOSPITAL_COMMUNITY): Payer: Self-pay

## 2016-10-06 DIAGNOSIS — I5023 Acute on chronic systolic (congestive) heart failure: Secondary | ICD-10-CM | POA: Insufficient documentation

## 2016-10-06 DIAGNOSIS — N183 Chronic kidney disease, stage 3 (moderate): Secondary | ICD-10-CM | POA: Insufficient documentation

## 2016-10-06 DIAGNOSIS — Z79899 Other long term (current) drug therapy: Secondary | ICD-10-CM | POA: Insufficient documentation

## 2016-10-06 DIAGNOSIS — Z794 Long term (current) use of insulin: Secondary | ICD-10-CM | POA: Insufficient documentation

## 2016-10-06 DIAGNOSIS — I130 Hypertensive heart and chronic kidney disease with heart failure and stage 1 through stage 4 chronic kidney disease, or unspecified chronic kidney disease: Secondary | ICD-10-CM | POA: Insufficient documentation

## 2016-10-06 DIAGNOSIS — Z87891 Personal history of nicotine dependence: Secondary | ICD-10-CM | POA: Insufficient documentation

## 2016-10-06 DIAGNOSIS — E1122 Type 2 diabetes mellitus with diabetic chronic kidney disease: Secondary | ICD-10-CM | POA: Insufficient documentation

## 2016-10-06 LAB — BASIC METABOLIC PANEL
Anion gap: 5 (ref 5–15)
BUN: 16 mg/dL (ref 6–20)
CO2: 24 mmol/L (ref 22–32)
CREATININE: 1.55 mg/dL — AB (ref 0.61–1.24)
Calcium: 9.4 mg/dL (ref 8.9–10.3)
Chloride: 110 mmol/L (ref 101–111)
GFR calc Af Amer: >60 mL/min (ref >60)
GFR, EST NON AFRICAN AMERICAN: 54 mL/min — AB (ref >60)
GLUCOSE: 120 mg/dL — AB (ref 65–99)
POTASSIUM: 4.1 mmol/L (ref 3.5–5.1)
Sodium: 139 mmol/L (ref 135–145)

## 2016-10-06 LAB — TROPONIN I: Troponin I: 0.04 ng/mL (ref <0.03)

## 2016-10-06 LAB — I-STAT TROPONIN, ED: Troponin i, poc: 0.02 ng/mL (ref 0.00–0.08)

## 2016-10-06 LAB — CBC
HEMATOCRIT: 46.5 % (ref 39.0–52.0)
Hemoglobin: 15.4 g/dL (ref 13.0–17.0)
MCH: 27.7 pg (ref 26.0–34.0)
MCHC: 33.1 g/dL (ref 30.0–36.0)
MCV: 83.8 fL (ref 78.0–100.0)
PLATELETS: 260 10*3/uL (ref 150–400)
RBC: 5.55 MIL/uL (ref 4.22–5.81)
RDW: 16.1 % — AB (ref 11.5–15.5)
WBC: 11.5 10*3/uL — ABNORMAL HIGH (ref 4.0–10.5)

## 2016-10-06 LAB — BRAIN NATRIURETIC PEPTIDE: B Natriuretic Peptide: 549.6 pg/mL — ABNORMAL HIGH (ref 0.0–100.0)

## 2016-10-06 MED ORDER — LABETALOL HCL 5 MG/ML IV SOLN
20.0000 mg | Freq: Once | INTRAVENOUS | Status: AC
  Administered 2016-10-06: 20 mg via INTRAVENOUS
  Filled 2016-10-06: qty 4

## 2016-10-06 MED ORDER — FUROSEMIDE 10 MG/ML IJ SOLN
40.0000 mg | Freq: Once | INTRAMUSCULAR | Status: AC
  Administered 2016-10-06: 40 mg via INTRAVENOUS
  Filled 2016-10-06: qty 4

## 2016-10-06 NOTE — Discharge Instructions (Signed)
Take an extra 1/2 pill of your Demadex for the next 3 days.

## 2016-10-06 NOTE — ED Provider Notes (Signed)
[facility] DEPT Provider Note   CSN: [id number] Arrival date & time: 10/06/16  1215     History   Chief Complaint Chief Complaint  Patient presents with  . Chest Pain  . Shortness of Breath    HPI Jeffrey [name] is a 41 y.o. male.  Pt presents to the ED today with left sided CP and sob.  Pt has multiple medical problems.  He said he's been compliant with his medications, except for not taking his meds today.  The pt does have a hx of CHF.  He has an AICD placed in 2016 due to his low EF.  He was cathed in 2016 as well which showed normal coronary arteries.        Past Medical History:  Diagnosis Date  . Anxiety   . Aspirin allergy   . Childhood asthma   . Chronic systolic CHF (congestive heart failure) ([place])    a. EF 20-25% in 2012. b. EF 45-50% in 10/2011 with nonischemic nuc - presumed NICM. c. 12/2014 Echo: Sev depressed LV fxn, sev dil LV, mild LVH, mild MR, sev dil LA, mildly reduced RV fxn.  . CKD (chronic kidney disease) stage 2, GFR 60-89 ml/min   . High cholesterol   . Hypertension   . Morbid obesity ([place])   . Nephrolithiasis   . OSA on CPAP   . Presumed NICM    a. 04/2014 Myoview: EF 26%, glob HK, sev glob HK, ? prior infarct;  b. Never cathed 2/2 CKD.  [name] Renal cell carcinoma ([place])    a. s/p Rt robotic assisted partial converted to radical nephrectomy on 01/2013.  . Troponin level elevated    a. 04/2014, 12/2014: felt due to CHF.  . Type II diabetes mellitus [facility])     Patient Active Problem List   Diagnosis Date Noted  . CHF exacerbation ([place]) 01/15/2016  . Hematochezia 11/08/2015  . CKD (chronic kidney disease), stage III 06/20/2015  . Cardiac device in situ, other   . Pain in the chest   . Acute on chronic systolic HF (heart failure) ([place])   . Chronic systolic heart failure ([place]) 03/11/2015  . Gout 03/11/2015  . Chest pain 12/28/2014  . CHF (congestive heart failure), NYHA class I ([place])   . Acute-on-chronic kidney injury ([place]) 04/08/2014  . Elevated  troponin I level 04/08/2014  . Aspirin allergy 04/08/2014  . Renal cell carcinoma ([place])   . Acute on chronic systolic CHF (congestive heart failure) ([place]) 04/05/2014  . Accelerated hypertension 04/05/2014  . DM (diabetes mellitus) ([place]) 04/05/2014  . Gout attack 11/10/2011  . Morbid obesity ([place]) 10/23/2011  . OSA (obstructive sleep apnea) 10/23/2011  . Diabetes mellitus type 2, uncontrolled ([place]) 10/22/2011  . [place], MILD 12/25/2010  . HYPERTENSION, BENIGN ESSENTIAL 12/25/2010    Past Surgical History:  Procedure Laterality Date  . APPENDECTOMY  07/2004  . CARDIAC CATHETERIZATION N/A 05/17/2015   Procedure: Right/Left Heart Cath and Coronary Angiography;  Surgeon: Jolaine Artist, MD;  Location: [facility] LAB;  Service: Cardiovascular;  Laterality: N/A;  . EP IMPLANTABLE DEVICE N/A 05/17/2015   Procedure: SubQ ICD Implant;  Surgeon: Will Meredith Leeds, MD;  Location: [place] [facility] LAB;  Service: Cardiovascular;  Laterality: N/A;  . ROBOTIC ASSISTED LAPAROSCOPIC LYSIS OF ADHESION  01/13/2013   Procedure: ROBOTIC ASSISTED LAPAROSCOPIC LYSIS OF ADHESION EXTENSIVE;  Surgeon: Alexis Frock, MD;  Location: [facility];  Service: Urology;;  . ROBOTIC ASSITED PARTIAL NEPHRECTOMY Right 01/13/2013   Procedure:  ROBOTIC ASSITED PARTIAL NEPHRECTOMY CONVERTED TO ROBOTIC ASSISTED RIGHT RADICAL NEPHRECTOMY;  Surgeon: Alexis Frock, MD;  Location: [facility];  Service: Urology;  Laterality: Right;       Home Medications    Prior to Admission medications   Medication Sig Start Date End Date Taking? Authorizing Provider  allopurinol (ZYLOPRIM) 100 MG tablet Take 1 tablet (100 mg total) by mouth daily. 04/30/16  Yes Jolaine Artist, MD  atorvastatin (LIPITOR) 80 MG tablet TAKE 1 TABLET (80 MG TOTAL) BY MOUTH EVERY EVENING. 12/25/15  Yes Shirley Friar, PA-C  carvedilol (COREG) 25 MG tablet Take 1 tablet (25 mg total) by mouth 2 (two) times daily with a meal. 12/25/15  Yes Shirley Friar, PA-C  diazepam (VALIUM) 5 MG tablet Take 1 tablet (5 mg total) by mouth every 8 (eight) hours as needed (Vertigo. Take if symptoms are not relieved by meclizine (Antivert ) alone.). 02/11/16  Yes [name], MD  hydrALAZINE (APRESOLINE) 25 MG tablet Take 1 tablet (25 mg total) by mouth 3 (three) times daily. 12/25/15  Yes Shirley Friar, PA-C  insulin NPH-regular Human (NOVOLIN 70/30) (70-30) 100 UNIT/ML injection Inject 34 Units into the skin 2 (two) times daily with a meal. 02/11/16  Yes Arnoldo Morale, MD  isosorbide mononitrate (IMDUR) 30 MG 24 hr tablet Take 1 tablet (30 mg total) by mouth daily. 12/25/15  Yes Shirley Friar, PA-C  meclizine (ANTIVERT) 25 MG tablet Take 1 tablet (25 mg total) by mouth 3 (three) times daily as needed for dizziness (Take meclizine for spinning dizziness. You may also take Valium if your symptoms are not relieved by the meclizine alone.). 02/11/16  Yes [name], MD  sacubitril-valsartan (ENTRESTO) 24-26 MG Take 1 tablet by mouth 2 (two) times daily. 04/01/16  Yes Amy D Clegg, NP  spironolactone (ALDACTONE) 25 MG tablet Take 1 tablet (25 mg total) by mouth daily. 12/25/15  Yes Shirley Friar, PA-C  torsemide (DEMADEX) 20 MG tablet Take 1 tablet (20 mg total) by mouth daily. 12/25/15  Yes Shirley Friar, PA-C  colchicine 0.6 MG tablet Take 1 tablet (0.6 mg total) by mouth 2 (two) times daily. Patient not taking: Reported on 10/06/2016 05/07/16   [name], PA-C  oxyCODONE-acetaminophen (PERCOCET/ROXICET) 5-325 MG tablet Take 2 tablets by mouth every 6 (six) hours as needed for severe pain. Patient not taking: Reported on 10/06/2016 05/07/16   [name], PA-C    Family History Family History  Problem Relation Age of Onset  . Hypertension Mother   . Diabetes Maternal Aunt   . Heart attack Neg Hx   . Stroke Neg Hx     Social History Social History  Substance Use Topics  . Smoking status: Former Smoker     Packs/day: 0.25    Years: 22.00    Quit date: 04/16/2015  . Smokeless tobacco: Never Used  . Alcohol use No     Comment: occ      Allergies   Aspirin; Bee venom; Lisinopril; and Tomato   Review of Systems Review of Systems  Respiratory: Positive for shortness of breath.   Cardiovascular: Positive for chest pain.  All other systems reviewed and are negative.    Physical Exam Updated Vital Signs BP 136/85   Pulse 79   Temp 98.1 F (36.7 C) (Oral)   Resp 16   Ht 5\' 7"  (1.702 m)   Wt 280 lb (127 kg)   SpO2 100%   BMI 43.85 kg/m  Physical Exam  Constitutional: He is oriented to person, place, and time. He appears well-developed and well-nourished.  HENT:  Head: Normocephalic and atraumatic.  Right Ear: External ear normal.  Left Ear: External ear normal.  Nose: Nose normal.  Mouth/Throat: Oropharynx is clear and moist.  Eyes: Conjunctivae and EOM are normal. Pupils are equal, round, and reactive to light.  Neck: Normal range of motion. Neck supple.  Cardiovascular: Normal rate, regular rhythm, normal heart sounds and intact distal pulses.   Pulmonary/Chest: Effort normal and breath sounds normal.  Abdominal: Soft. Bowel sounds are normal.  Musculoskeletal: Normal range of motion.  Neurological: He is alert and oriented to person, place, and time.  Skin: Skin is warm.  Psychiatric: He has a normal mood and affect. His behavior is normal. Judgment and thought content normal.  Nursing note and vitals reviewed.    ED Treatments / Results  Labs (all labs ordered are listed, but only abnormal results are displayed) Labs Reviewed  BASIC METABOLIC PANEL - Abnormal; Notable for the following:       Result Value   Glucose, Bld 120 (*)    Creatinine, Ser 1.55 (*)    GFR calc non Af Amer 54 (*)    All other components within normal limits  CBC - Abnormal; Notable for the following:    WBC 11.5 (*)    RDW 16.1 (*)    All other components within normal limits  BRAIN  NATRIURETIC PEPTIDE - Abnormal; Notable for the following:    B Natriuretic Peptide 549.6 (*)    All other components within normal limits  TROPONIN I  I-STAT TROPOININ, ED    EKG  EKG Interpretation  Date/Time:  Tuesday October 06 2016 12:20:45 EST Ventricular Rate:  88 PR Interval:  166 QRS Duration: 94 QT Interval:  386 QTC Calculation: 467 R Axis:   116 Text Interpretation:  Normal sinus rhythm Biatrial enlargement Right axis deviation Pulmonary disease pattern Abnormal ECG Confirmed by Gilford Raid MD, Saiya Crist ([id number]) on 10/06/2016 2:19:37 PM       Radiology Dg Chest 2 View  Result Date: 10/06/2016 CLINICAL DATA:  Chest tightness and shortness of breath for several days. History of morbid obesity, CHF, ICD placement, discontinued smoking approximately 18 months ago. EXAM: CHEST  2 VIEW COMPARISON:  Chest x-ray of Feb 11, 2016 FINDINGS: The lungs are adequately inflated. The interstitial markings are mildly increased. There is no alveolar infiltrate or pleural effusion. The heart is top-normal in size. The central pulmonary vascularity is engorged. The ICD is in stable position. The trachea is midline. The bony thorax exhibits no acute abnormality. IMPRESSION: Findings compatible with mild CHF. There is no alveolar pneumonia nor alveolar edema. Electronically Signed   By: David  Martinique M.D.   On: 10/06/2016 12:53    Procedures Procedures (including critical care time)  Medications Ordered in ED Medications  furosemide (LASIX) injection 40 mg (40 mg Intravenous Given 10/06/16 1440)  labetalol (NORMODYNE,TRANDATE) injection 20 mg (20 mg Intravenous Given 10/06/16 1444)     Initial Impression / Assessment and Plan / ED Course  I have reviewed the triage vital signs and the nursing notes.  Pertinent labs & imaging results that were available during my care of the patient were reviewed by me and considered in my medical decision making (see chart for details).  Clinical Course      Normal coronary arteries in 2016.  CP unlikely due to CAD.  The pt's BP is much better.  He is  instructed to take an extra 1/2 Demadex for the next 3 days and f/u with pcp.  He is instructed to take meds every day.  Final Clinical Impressions(s) / ED Diagnoses   Final diagnoses:  Acute on chronic systolic congestive heart failure [facility])    New Prescriptions New Prescriptions   No medications on file     Isla Pence, MD 10/06/16 1541

## 2016-10-06 NOTE — ED Triage Notes (Signed)
Onset this morning upon awakening constant chest tightness and shortness of breath.  Also, c/o intermittant pain on left upper mid axillary area of chest.  Talking  In complete sentences.  NAD at triage.  No recent illness.

## 2016-10-06 NOTE — ED Notes (Signed)
Pt given [place] sandwich and diet sprite, ok'd by Dr. Gilford Raid

## 2016-10-14 ENCOUNTER — Ambulatory Visit (INDEPENDENT_AMBULATORY_CARE_PROVIDER_SITE_OTHER): Payer: Self-pay | Admitting: *Deleted

## 2016-10-14 DIAGNOSIS — I428 Other cardiomyopathies: Secondary | ICD-10-CM

## 2016-10-14 NOTE — Progress Notes (Signed)
Remote ICD transmission.   

## 2016-10-15 ENCOUNTER — Encounter: Payer: Self-pay | Admitting: Cardiology

## 2016-10-29 ENCOUNTER — Encounter: Payer: Self-pay | Admitting: Cardiology

## 2016-10-29 LAB — CUP PACEART REMOTE DEVICE CHECK
Battery Remaining Percentage: 84 %
Implantable Lead Location: 753858
Implantable Lead Model: 3401
MDC IDC LEAD IMPLANT DT: 20160812
MDC IDC PG IMPLANT DT: 20160812
MDC IDC PG SERIAL: 117162
MDC IDC SESS DTM: 20180110122800

## 2016-11-23 ENCOUNTER — Ambulatory Visit (HOSPITAL_COMMUNITY)
Admission: RE | Admit: 2016-11-23 | Discharge: 2016-11-23 | Disposition: A | Discharge status: Home / Self Care | Payer: Self-pay | Source: Ambulatory Visit | Attending: Cardiology | Admitting: Cardiology

## 2016-11-23 ENCOUNTER — Encounter (HOSPITAL_COMMUNITY): Payer: Self-pay

## 2016-11-23 VITALS — BP 155/100 | HR 103 | Wt 317.0 lb

## 2016-11-23 DIAGNOSIS — Z87442 Personal history of urinary calculi: Secondary | ICD-10-CM | POA: Insufficient documentation

## 2016-11-23 DIAGNOSIS — Z794 Long term (current) use of insulin: Secondary | ICD-10-CM | POA: Insufficient documentation

## 2016-11-23 DIAGNOSIS — Z91030 Bee allergy status: Secondary | ICD-10-CM | POA: Insufficient documentation

## 2016-11-23 DIAGNOSIS — Z6841 Body Mass Index (BMI) 40.0 and over, adult: Secondary | ICD-10-CM | POA: Insufficient documentation

## 2016-11-23 DIAGNOSIS — C649 Malignant neoplasm of unspecified kidney, except renal pelvis: Secondary | ICD-10-CM | POA: Insufficient documentation

## 2016-11-23 DIAGNOSIS — I130 Hypertensive heart and chronic kidney disease with heart failure and stage 1 through stage 4 chronic kidney disease, or unspecified chronic kidney disease: Secondary | ICD-10-CM | POA: Insufficient documentation

## 2016-11-23 DIAGNOSIS — N183 Chronic kidney disease, stage 3 unspecified: Secondary | ICD-10-CM

## 2016-11-23 DIAGNOSIS — Z9114 Patient's other noncompliance with medication regimen: Secondary | ICD-10-CM | POA: Insufficient documentation

## 2016-11-23 DIAGNOSIS — Z905 Acquired absence of kidney: Secondary | ICD-10-CM | POA: Insufficient documentation

## 2016-11-23 DIAGNOSIS — E1122 Type 2 diabetes mellitus with diabetic chronic kidney disease: Secondary | ICD-10-CM | POA: Insufficient documentation

## 2016-11-23 DIAGNOSIS — G4733 Obstructive sleep apnea (adult) (pediatric): Secondary | ICD-10-CM | POA: Insufficient documentation

## 2016-11-23 DIAGNOSIS — I10 Essential (primary) hypertension: Secondary | ICD-10-CM

## 2016-11-23 DIAGNOSIS — Z87891 Personal history of nicotine dependence: Secondary | ICD-10-CM | POA: Insufficient documentation

## 2016-11-23 DIAGNOSIS — Z886 Allergy status to analgesic agent status: Secondary | ICD-10-CM | POA: Insufficient documentation

## 2016-11-23 DIAGNOSIS — M109 Gout, unspecified: Secondary | ICD-10-CM | POA: Insufficient documentation

## 2016-11-23 DIAGNOSIS — M25561 Pain in right knee: Secondary | ICD-10-CM | POA: Insufficient documentation

## 2016-11-23 DIAGNOSIS — E7800 Pure hypercholesterolemia, unspecified: Secondary | ICD-10-CM | POA: Insufficient documentation

## 2016-11-23 DIAGNOSIS — I429 Cardiomyopathy, unspecified: Secondary | ICD-10-CM | POA: Insufficient documentation

## 2016-11-23 DIAGNOSIS — Z91018 Allergy to other foods: Secondary | ICD-10-CM | POA: Insufficient documentation

## 2016-11-23 DIAGNOSIS — Z888 Allergy status to other drugs, medicaments and biological substances status: Secondary | ICD-10-CM | POA: Insufficient documentation

## 2016-11-23 DIAGNOSIS — I5022 Chronic systolic (congestive) heart failure: Secondary | ICD-10-CM | POA: Insufficient documentation

## 2016-11-23 LAB — BASIC METABOLIC PANEL
Anion gap: 7 (ref 5–15)
BUN: 11 mg/dL (ref 6–20)
CO2: 22 mmol/L (ref 22–32)
CREATININE: 1.31 mg/dL — AB (ref 0.61–1.24)
Calcium: 9.3 mg/dL (ref 8.9–10.3)
Chloride: 113 mmol/L — ABNORMAL HIGH (ref 101–111)
GFR calc Af Amer: >60 mL/min (ref >60)
GLUCOSE: 116 mg/dL — AB (ref 65–99)
POTASSIUM: 4.4 mmol/L (ref 3.5–5.1)
SODIUM: 142 mmol/L (ref 135–145)

## 2016-11-23 MED ORDER — ISOSORBIDE MONONITRATE ER 30 MG PO TB24: 30.0000 mg | ORAL_TABLET | Freq: Every day | ORAL | 3 refills | Status: DC

## 2016-11-23 MED ORDER — ALLOPURINOL 100 MG PO TABS
100.0000 mg | ORAL_TABLET | Freq: Every day | ORAL | 3 refills | Status: DC
Start: 2016-11-23 — End: 2017-02-17

## 2016-11-23 MED ORDER — SACUBITRIL-VALSARTAN 49-51 MG PO TABS: 1.0000 | ORAL_TABLET | Freq: Two times a day (BID) | ORAL | 3 refills | Status: DC

## 2016-11-23 MED ORDER — TORSEMIDE 20 MG PO TABS: 20.0000 mg | ORAL_TABLET | Freq: Every day | ORAL | 3 refills | Status: DC

## 2016-11-23 MED ORDER — HYDRALAZINE HCL 25 MG PO TABS
25.0000 mg | ORAL_TABLET | Freq: Three times a day (TID) | ORAL | 3 refills | Status: DC
Start: 2016-11-23 — End: 2017-01-18

## 2016-11-23 MED ORDER — SPIRONOLACTONE 25 MG PO TABS: 25.0000 mg | ORAL_TABLET | Freq: Every day | ORAL | 3 refills | Status: DC

## 2016-11-23 MED ORDER — CARVEDILOL 25 MG PO TABS: 25.0000 mg | ORAL_TABLET | Freq: Two times a day (BID) | ORAL | 3 refills | Status: DC

## 2016-11-23 MED ORDER — ATORVASTATIN CALCIUM 80 MG PO TABS: ORAL_TABLET | ORAL | 3 refills | Status: DC

## 2016-11-23 MED FILL — hydrALAZINE HCL 25 MG TABS: 25 | 30 days supply | Qty: 90 | Fill #0

## 2016-11-23 MED FILL — TORSEMIDE 20 MG TABLET: 20 | 30 days supply | Qty: 30 | Fill #0

## 2016-11-23 MED FILL — ISOSORBIDE MN ER 30 MG TAB: 30 | 30 days supply | Qty: 30 | Fill #0

## 2016-11-23 MED FILL — ENTRESTO 49 MG-51 MG TABLET: 49-51 | 30 days supply | Qty: 60 | Fill #0

## 2016-11-23 MED FILL — ?ALLOPURINOL 100MG TABLET: 100 | 30 days supply | Qty: 30 | Fill #0

## 2016-11-23 MED FILL — ATORVASTATIN 80 MG TABLET: 80 | 30 days supply | Qty: 30 | Fill #0

## 2016-11-23 MED FILL — CARVEDILOL 25 MG TABLET: 25 | 30 days supply | Qty: 60 | Fill #0

## 2016-11-23 MED FILL — SPIRONOLACTONE 25 MG TABLET: 25 | 30 days supply | Qty: 30 | Fill #0

## 2016-11-23 NOTE — Patient Instructions (Addendum)
INCREASE Entresto to 49/51 mg tablets twice daily. Can "double up" on tabs you currently have at home (Take 2 tabs twice daily). New Rx has been sent to your pharmacy for correct dose tablets (Take 1 tablet twice daily).  Routine lab work today. Will notify you of abnormal results, otherwise no news is good news!  Follow up 1-2 weeks with repeat lab.  _________________________________________________________  _________________________________________________________  Follow up 6 weeks with Oda Kilts PA-C.  Do the following things EVERYDAY: 1) Weigh yourself in the morning before breakfast. Write it down and keep it in a log. 2) Take your medicines as prescribed 3) Eat low salt foods-Limit salt (sodium) to 2000 mg per day.  4) Stay as active as you can everyday 5) Limit all fluids for the day to less than 2 liters

## 2016-11-23 NOTE — Progress Notes (Signed)
Patient ID: Jeffrey [name], male   DOB: 08-28-1976, 41 y.o.   MRN: [id number]      Advanced Heart Failure Clinic Note   Patient Name: Jeffrey [name] Date of Encounter: 11/23/2016  Primary Care Provider:  Angelica Chessman, MD Primary Cardiologist:  Lizbeth Bark, MD  Primary HF Cardiologist: Bens[place]  History: Ms. 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    Admitted to [facility] in 3/16 secondary to atypical chest pain, dyspnea, and mild troponin elevation. Echo during hospitalization shows "severely reduced LV function" - EF not quantified due to poor windows. He was diuresed and was down about 7 pounds at discharge, at 289 on the hospital scale.   Admitted 05/14/15 with CP at rest. CXR looked improved from previous. CP resolved spontaneously after hospitalization and troponin was flat. R/LHC showed normal coronaries and moderately elevated filling pressures with moderately depressed cardiac output. He had been previously seen by EP and approved for ICD with chronic low EF. [place] subcutaneous ICD was implanted 05/17/15.  He was diuresed with IV lasix 40 mg. Discharge weight 290 lb.  Admitted 4/11 through 01/16/16 with volume overload in the setting of missed medications. Diuresed with IV lasix and transitioned to torsemide 20 mg daily. Discharge weight was 293 pounds.   He presents today for follow up.  Last seen in clinic 03/2016.  Weight up 15 lbs from last visit. (302 -> 317 lbs). States he has been feeling great.  No SOB at all. Getting over a cold.  Last time he took torsemide was Friday, 11/20/16. No meds yet this am. Denies DOE. Weight at home 295 @ home, states this was this previous Friday, without clothes.  Using CPAP nightly.  Working full time at [employer].  Still has not heard back from Medicaid.   ECHO 01/16/2016 EF  20-25%  Grade III DD Echo 05/07/15.  EF 20-25%, grade II diastolic dysfunction, diffuse hypokinesis, moderate LV dilation, mild LVH, normal RV size with mildly decreased systolic function.   Echo in 2012 with EF 20-25%.  In 1/13 had nuclear study EF 45-50% suggestive of NICM.   [facility] 05/17/15 RA = 7 RV = 42/3/7 PA = 43/30 (39) PCW = 26 Fick cardiac output/index = 4.9/2.1 PVR = 2.7 WU SVR = 1651 FA sat = 95% PA sat = 65%, 65% Ao = 122/97 (109) LV = 110/31/33 1) Normal coronaries 2) Moderately elevated filling pressures with moderately depressed cardiac output  Labs (6/16): K 4.1, creatinine 1.52, BNP 133 Labs (06/16/15): K 3.9, creatinine 1.9 Labs (06/28/2015) K 4.1 Creatinine 1.59  Labs (09/10/2015) K 3.9 Creatinine 1.62  Labs (02/11/2016)  K 3.9 Creatinine 1.64    Past Medical History   Past Medical History:  Diagnosis Date  . Anxiety   . Aspirin allergy   . Childhood asthma   . Chronic systolic CHF (congestive heart failure) ([place])    a. EF 20-25% in 2012. b. EF 45-50% in 10/2011 with nonischemic nuc - presumed NICM. c. 12/2014 Echo: Sev depressed LV fxn, sev dil LV, mild LVH, mild MR, sev dil LA, mildly reduced RV fxn.  . CKD (chronic kidney disease) stage 2, GFR 60-89 ml/min   . High cholesterol   . Hypertension   . Morbid obesity ([place])   . Nephrolithiasis   . OSA on CPAP   . Presumed  NICM    a. 04/2014 Myoview: EF 26%, glob HK, sev glob HK, ? prior infarct;  b. Never cathed 2/2 CKD.  [name] Renal cell carcinoma ([place])    a. s/p Rt robotic assisted partial converted to radical nephrectomy on 01/2013.  . Troponin level elevated    a. 04/2014, 12/2014: felt due to CHF.  . Type II diabetes mellitus ([place])    Past Surgical History:  Procedure Laterality Date  . APPENDECTOMY  07/2004  . CARDIAC CATHETERIZATION N/A 05/17/2015   Procedure: Right/Left Heart Cath and Coronary Angiography;  Surgeon: Jolaine Artist, MD;  Location: [facility] CV LAB;  Service: Cardiovascular;  Laterality:  N/A;  . EP IMPLANTABLE DEVICE N/A 05/17/2015   Procedure: SubQ ICD Implant;  Surgeon: Will Meredith Leeds, MD;  Location: [facility] CV LAB;  Service: Cardiovascular;  Laterality: N/A;  . ROBOTIC ASSISTED LAPAROSCOPIC LYSIS OF ADHESION  01/13/2013   Procedure: ROBOTIC ASSISTED LAPAROSCOPIC LYSIS OF ADHESION EXTENSIVE;  Surgeon: Alexis Frock, MD;  [facility];  Service: Urology;;  . ROBOTIC ASSITED PARTIAL NEPHRECTOMY Right 01/13/2013   Procedure: ROBOTIC ASSITED PARTIAL NEPHRECTOMY CONVERTED TO ROBOTIC ASSISTED RIGHT RADICAL NEPHRECTOMY;  Surgeon: Alexis Frock, MD;  [facility];  Service: Urology;  Laterality: Right;    Allergies  Allergies  Allergen Reactions  . Aspirin Shortness Of Breath, Itching and Rash     Burning sensation (Patient reports he tolerates other NSAIDS)   . Bee Venom Hives and Swelling  . Lisinopril Cough  . Tomato Rash     Home Medications Current Outpatient Prescriptions  Medication Sig Dispense Refill  . allopurinol (ZYLOPRIM) 100 MG tablet Take 1 tablet (100 mg total) by mouth daily. 30 tablet 3  . atorvastatin (LIPITOR) 80 MG tablet TAKE 1 TABLET (80 MG TOTAL) BY MOUTH EVERY EVENING. 30 tablet 6  . carvedilol (COREG) 25 MG tablet Take 1 tablet (25 mg total) by mouth 2 (two) times daily with a meal. 60 tablet 6  . colchicine 0.6 MG tablet Take 1 tablet (0.6 mg total) by mouth 2 (two) times daily. 8 tablet 0  . diazepam (VALIUM) 5 MG tablet Take 1 tablet (5 mg total) by mouth every 8 (eight) hours as needed (Vertigo. Take if symptoms are not relieved by meclizine (Antivert ) alone.). 20 tablet 0  . hydrALAZINE (APRESOLINE) 25 MG tablet Take 1 tablet (25 mg total) by mouth 3 (three) times daily. 90 tablet 3  . insulin NPH-regular Human (NOVOLIN 70/30) (70-30) 100 UNIT/ML injection Inject 34 Units into the skin 2 (two) times daily with a meal. 4 vial 1  . isosorbide mononitrate (IMDUR) 30 MG 24 hr tablet Take 1 tablet (30 mg total) by mouth daily. 30  tablet 11  . meclizine (ANTIVERT) 25 MG tablet Take 1 tablet (25 mg total) by mouth 3 (three) times daily as needed for dizziness (Take meclizine for spinning dizziness. You may also take Valium if your symptoms are not relieved by the meclizine alone.). 30 tablet 0  . oxyCODONE-acetaminophen (PERCOCET/ROXICET) 5-325 MG tablet Take 2 tablets by mouth every 6 (six) hours as needed for severe pain. 15 tablet 0  . sacubitril-valsartan (ENTRESTO) 24-26 MG Take 1 tablet by mouth 2 (two) times daily. 60 tablet 11  . spironolactone (ALDACTONE) 25 MG tablet Take 1 tablet (25 mg total) by mouth daily. 30 tablet 6  . torsemide (DEMADEX) 20 MG tablet Take 1 tablet (20 mg total) by mouth daily. 90 tablet 3   No current facility-administered medications  for this encounter.    Review of Systems All other systems reviewed and are otherwise negative except as noted in HPI or Assessment and plan.   Physical Exam  VS:  BP (!) 155/100 (BP Location: Left Arm, Patient Position: Sitting, Cuff Size: Large)   Pulse (!) 103   Wt (!) 317 lb (143.8 kg)   SpO2 (!) 88%   BMI 49.65 kg/m  , Body mass index is 49.65 kg/m.   Wt Readings from Last 3 Encounters:  11/23/16 (!) 317 lb (143.8 kg)  10/06/16 280 lb (127 kg)  03/24/16 (!) 302 lb (137 kg)    GEN: Well appearing. Obese. NAD.   HEENT: Normal Neck: Supple, JVP difficult 2/2 body habitus. Appears at least mildly elevated. No carotid bruits, or masses. Cardiac: RRR, no murmurs, rubs, or gallops. No clubbing, cyanosis, or edema.  Radials/DP/PT 2+ and equal bilaterally.  Respiratory: Clear, normal effort.  GI: Obese, soft, NT, ND, no HSM. No bruits or masses. +BS  MS: No deformity or atrophy. R and LLE no edema.   Skin: Warm and dry, no rash. Neuro:  Strength and sensation are intact. Extremities: No clubbing or cyanosis.  Psych: Normal affect.  Assessment & Plan 1.  Chronic systolic congestive heart failure: s/p [employer] subcutaneous ICD implant  05/17/15.  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 Echo 01/2016  EF 20-25%.  NYHA class II symptoms. Currently no complains of DOE.  - Volume status mildly elevated on exam.  Restart torsemide 20 mg daily.  Can take 40 mg today when he gets medicines back.    - Increase Entresto to 49/51 mg BID. BMET today and 10-14 days.   - Continue coreg 25 mg bid.  - Continue hydralazine 25 mg TID and continue Imdur 30 mg daily.  - Continue spironolactone 25 mg daily  - Reinforced fluid restriction to < 2 L daily, sodium restriction to less than 2000 mg daily, and the importance of daily weights.   2. Hypertension:  - Elevated in clinic today. Meds as above.   3. CKD stage III:  - BMET today.   4. OSA:  - Continue nightly CPAP.   5. Tobacco use: - Remains abstinent. Congratulated.  6. Gout:  - Continue allopurinol 200 daily for prophylaxis. No change to current plan.  7. DM2: On insulin. Following at [facility]. No change.  8. R knee pain.: - Having arthritis/joint pain again. In the past has benefited from injection. Encouraged to follow up.  9.Renal Cell Carcinoma - S/P nephrectomy 2014.  10. Morbid Obesity - Encouraged to limit portions and increase activity as tolerated.  11. Non-compliance - Suspect he has been out of his medications longer than he admits. Will keep follow up closer. To encouraged compliance.    Follow up 6 weeks. Meds and labs as above.  Will plan repeat Echo later this year.   Satira Mccallum Maijor Hornig, PA-C  11:12 AM   Total time spent > 25 minutes. Over half that spent discussing the above.

## 2016-12-07 ENCOUNTER — Inpatient Hospital Stay (HOSPITAL_COMMUNITY): Admission: RE | Admit: 2016-12-07 | Payer: Self-pay | Source: Ambulatory Visit

## 2016-12-14 ENCOUNTER — Ambulatory Visit (HOSPITAL_COMMUNITY)
Admission: RE | Admit: 2016-12-14 | Discharge: 2016-12-14 | Disposition: A | Discharge status: Home / Self Care | Payer: Self-pay | Source: Ambulatory Visit | Attending: Internal Medicine | Admitting: Internal Medicine

## 2016-12-14 DIAGNOSIS — I5022 Chronic systolic (congestive) heart failure: Secondary | ICD-10-CM | POA: Insufficient documentation

## 2016-12-14 LAB — BASIC METABOLIC PANEL
Anion gap: 8 (ref 5–15)
BUN: 20 mg/dL (ref 6–20)
CALCIUM: 9.1 mg/dL (ref 8.9–10.3)
CHLORIDE: 108 mmol/L (ref 101–111)
CO2: 24 mmol/L (ref 22–32)
CREATININE: 1.48 mg/dL — AB (ref 0.61–1.24)
GFR calc non Af Amer: 57 mL/min — ABNORMAL LOW (ref >60)
GLUCOSE: 141 mg/dL — AB (ref 65–99)
Potassium: 4 mmol/L (ref 3.5–5.1)
Sodium: 140 mmol/L (ref 135–145)

## 2017-01-04 ENCOUNTER — Encounter (HOSPITAL_COMMUNITY): Payer: Self-pay

## 2017-01-12 ENCOUNTER — Inpatient Hospital Stay (HOSPITAL_COMMUNITY): Admission: RE | Admit: 2017-01-12 | Payer: Self-pay | Source: Ambulatory Visit

## 2017-01-13 ENCOUNTER — Ambulatory Visit (INDEPENDENT_AMBULATORY_CARE_PROVIDER_SITE_OTHER): Payer: Self-pay | Admitting: *Deleted

## 2017-01-13 DIAGNOSIS — I428 Other cardiomyopathies: Secondary | ICD-10-CM

## 2017-01-13 NOTE — Progress Notes (Signed)
Remote ICD transmission.   

## 2017-01-14 ENCOUNTER — Encounter: Payer: Self-pay | Admitting: Cardiology

## 2017-01-14 MED FILL — TORSEMIDE 20 MG TABLET: 20 | 30 days supply | Qty: 30 | Fill #1

## 2017-01-15 LAB — CUP PACEART REMOTE DEVICE CHECK
Battery Remaining Percentage: 81 %
Implantable Lead Implant Date: 20160812
Implantable Lead Location: 753858
Implantable Lead Model: 3401
Implantable Pulse Generator Implant Date: 20160812
MDC IDC PG SERIAL: 117162
MDC IDC SESS DTM: 20180411111200

## 2017-01-15 MED FILL — !NOVOLIN 70/30 100 UNITS/ML: (70-30) 100 | 14 days supply | Qty: 10 | Fill #1

## 2017-01-18 ENCOUNTER — Ambulatory Visit (HOSPITAL_COMMUNITY)
Admission: RE | Admit: 2017-01-18 | Discharge: 2017-01-18 | Disposition: A | Discharge status: Home / Self Care | Payer: Self-pay | Source: Ambulatory Visit | Attending: Internal Medicine | Admitting: Internal Medicine

## 2017-01-18 ENCOUNTER — Telehealth: Payer: Self-pay | Admitting: Internal Medicine

## 2017-01-18 VITALS — BP 122/88 | HR 82 | Wt 311.6 lb

## 2017-01-18 DIAGNOSIS — Z9114 Patient's other noncompliance with medication regimen: Secondary | ICD-10-CM | POA: Insufficient documentation

## 2017-01-18 DIAGNOSIS — E7800 Pure hypercholesterolemia, unspecified: Secondary | ICD-10-CM | POA: Insufficient documentation

## 2017-01-18 DIAGNOSIS — I429 Cardiomyopathy, unspecified: Secondary | ICD-10-CM | POA: Insufficient documentation

## 2017-01-18 DIAGNOSIS — Z91030 Bee allergy status: Secondary | ICD-10-CM | POA: Insufficient documentation

## 2017-01-18 DIAGNOSIS — G4733 Obstructive sleep apnea (adult) (pediatric): Secondary | ICD-10-CM

## 2017-01-18 DIAGNOSIS — M25561 Pain in right knee: Secondary | ICD-10-CM | POA: Insufficient documentation

## 2017-01-18 DIAGNOSIS — I130 Hypertensive heart and chronic kidney disease with heart failure and stage 1 through stage 4 chronic kidney disease, or unspecified chronic kidney disease: Secondary | ICD-10-CM | POA: Insufficient documentation

## 2017-01-18 DIAGNOSIS — Z87891 Personal history of nicotine dependence: Secondary | ICD-10-CM | POA: Insufficient documentation

## 2017-01-18 DIAGNOSIS — Z95810 Presence of automatic (implantable) cardiac defibrillator: Secondary | ICD-10-CM | POA: Insufficient documentation

## 2017-01-18 DIAGNOSIS — Z888 Allergy status to other drugs, medicaments and biological substances status: Secondary | ICD-10-CM | POA: Insufficient documentation

## 2017-01-18 DIAGNOSIS — IMO0002 Reserved for concepts with insufficient information to code with codable children: Secondary | ICD-10-CM

## 2017-01-18 DIAGNOSIS — Z905 Acquired absence of kidney: Secondary | ICD-10-CM | POA: Insufficient documentation

## 2017-01-18 DIAGNOSIS — Z886 Allergy status to analgesic agent status: Secondary | ICD-10-CM | POA: Insufficient documentation

## 2017-01-18 DIAGNOSIS — Z91018 Allergy to other foods: Secondary | ICD-10-CM | POA: Insufficient documentation

## 2017-01-18 DIAGNOSIS — N183 Chronic kidney disease, stage 3 unspecified: Secondary | ICD-10-CM

## 2017-01-18 DIAGNOSIS — I10 Essential (primary) hypertension: Secondary | ICD-10-CM

## 2017-01-18 DIAGNOSIS — E1122 Type 2 diabetes mellitus with diabetic chronic kidney disease: Secondary | ICD-10-CM | POA: Insufficient documentation

## 2017-01-18 DIAGNOSIS — E1165 Type 2 diabetes mellitus with hyperglycemia: Secondary | ICD-10-CM

## 2017-01-18 DIAGNOSIS — I5022 Chronic systolic (congestive) heart failure: Secondary | ICD-10-CM

## 2017-01-18 DIAGNOSIS — Z87442 Personal history of urinary calculi: Secondary | ICD-10-CM | POA: Insufficient documentation

## 2017-01-18 DIAGNOSIS — M109 Gout, unspecified: Secondary | ICD-10-CM | POA: Insufficient documentation

## 2017-01-18 DIAGNOSIS — F419 Anxiety disorder, unspecified: Secondary | ICD-10-CM | POA: Insufficient documentation

## 2017-01-18 DIAGNOSIS — Z794 Long term (current) use of insulin: Secondary | ICD-10-CM

## 2017-01-18 DIAGNOSIS — E118 Type 2 diabetes mellitus with unspecified complications: Secondary | ICD-10-CM

## 2017-01-18 DIAGNOSIS — C649 Malignant neoplasm of unspecified kidney, except renal pelvis: Secondary | ICD-10-CM | POA: Insufficient documentation

## 2017-01-18 DIAGNOSIS — Z6841 Body Mass Index (BMI) 40.0 and over, adult: Secondary | ICD-10-CM | POA: Insufficient documentation

## 2017-01-18 DIAGNOSIS — Z9889 Other specified postprocedural states: Secondary | ICD-10-CM | POA: Insufficient documentation

## 2017-01-18 LAB — MAGNESIUM: Magnesium: 1.5 mg/dL — ABNORMAL LOW (ref 1.7–2.4)

## 2017-01-18 LAB — BASIC METABOLIC PANEL
ANION GAP: 6 (ref 5–15)
BUN: 16 mg/dL (ref 6–20)
CALCIUM: 9.1 mg/dL (ref 8.9–10.3)
CO2: 26 mmol/L (ref 22–32)
Chloride: 107 mmol/L (ref 101–111)
Creatinine, Ser: 1.6 mg/dL — ABNORMAL HIGH (ref 0.61–1.24)
GFR calc Af Amer: 60 mL/min — ABNORMAL LOW (ref >60)
GFR calc non Af Amer: 52 mL/min — ABNORMAL LOW (ref >60)
GLUCOSE: 156 mg/dL — AB (ref 65–99)
Potassium: 4 mmol/L (ref 3.5–5.1)
Sodium: 139 mmol/L (ref 135–145)

## 2017-01-18 MED ORDER — HYDRALAZINE HCL 50 MG PO TABS: 50.0000 mg | ORAL_TABLET | Freq: Three times a day (TID) | ORAL | 6 refills | Status: DC

## 2017-01-18 MED FILL — hydrALAZINE HCL 50 MG TABS: 50 | 30 days supply | Qty: 90 | Fill #0

## 2017-01-18 NOTE — Telephone Encounter (Signed)
Patient called the office to request a one month supply of insulin NPH-regular Human (NOVOLIN 70/30) (70-30) 100 UNIT/ML injection. Pt doesn't have an medication left. There aren't any appt sooner with PCP. Please call rx to our pharmacy. '  Thank you.
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
## 2017-01-18 NOTE — Patient Instructions (Addendum)
INCREASE Hydralazine to 50 mg three times daily ( once every 8 hours). May "double up" on your current 25 mg tablets you have at home: Take 2 tabs three times daily. New Rx has been sent for 50 mg tablets: Take 1 tab three times daily.  Routine lab work today. Will notify you of abnormal results, otherwise no news is good news!  Will schedule you for [facility] appointment. Address: [address], [place], [place] [address] Phone: [phone]  _______________________________________________________  _______________________________________________________  Follow up with Dr. [name] and echocardiogram in 2 months.  Do the following things EVERYDAY: 1) Weigh yourself in the morning before breakfast. Write it down and keep it in a log. 2) Take your medicines as prescribed 3) Eat low salt foods-Limit salt (sodium) to 2000 mg per day.  4) Stay as active as you can everyday 5) Limit all fluids for the day to less than 2 liters

## 2017-01-19 ENCOUNTER — Telehealth (HOSPITAL_COMMUNITY): Payer: Self-pay | Admitting: Cardiology

## 2017-01-19 DIAGNOSIS — I5022 Chronic systolic (congestive) heart failure: Secondary | ICD-10-CM

## 2017-01-19 MED ORDER — INSULIN NPH ISOPHANE & REGULAR (70-30) 100 UNIT/ML ~~LOC~~ SUSP: 34.0000 [IU] | Freq: Two times a day (BID) | SUBCUTANEOUS | 0 refills | Status: DC

## 2017-01-19 MED ORDER — MAGNESIUM OXIDE -MG SUPPLEMENT 200 MG PO TABS: 200.0000 mg | ORAL_TABLET | Freq: Every day | ORAL | 2 refills | Status: DC

## 2017-01-19 MED FILL — !NOVOLIN 70/30 100 UNITS/ML: (70-30) 100 | 29 days supply | Qty: 20 | Fill #0

## 2017-01-19 NOTE — Telephone Encounter (Signed)
Insulin refilled x 30 day supply - must have office visit for any further refills.

## 2017-01-19 NOTE — Telephone Encounter (Signed)
Patient aware. Patient voiced understanding Repeat labs 5/2

## 2017-01-19 NOTE — Telephone Encounter (Signed)
-----   Message from Shirley Friar, PA-C sent at 01/18/2017 12:03 PM EDT ----- Please add mag ox 200 mg daily and repeat 2 weeks.    Legrand Como [address]" [place], PA-C 01/18/2017 12:03 PM

## 2017-01-28 ENCOUNTER — Encounter: Payer: Self-pay | Admitting: Cardiology

## 2017-01-28 MED FILL — ALLOPURINOL 100 MG TABLET: 100 | 30 days supply | Qty: 30 | Fill #1

## 2017-02-03 ENCOUNTER — Ambulatory Visit: Payer: Self-pay | Admitting: Internal Medicine

## 2017-02-09 ENCOUNTER — Encounter: Payer: Self-pay | Admitting: Internal Medicine

## 2017-02-16 MED FILL — SPIRONOLACTONE 25 MG TABLET: 25 | 30 days supply | Qty: 30 | Fill #1

## 2017-02-16 MED FILL — ISOSORBIDE MN ER 30 MG TAB: 30 | 30 days supply | Qty: 30 | Fill #1

## 2017-02-17 ENCOUNTER — Ambulatory Visit: Payer: Self-pay | Attending: Internal Medicine | Admitting: Internal Medicine

## 2017-02-17 DIAGNOSIS — J45909 Unspecified asthma, uncomplicated: Secondary | ICD-10-CM | POA: Insufficient documentation

## 2017-02-17 DIAGNOSIS — Z85528 Personal history of other malignant neoplasm of kidney: Secondary | ICD-10-CM | POA: Insufficient documentation

## 2017-02-17 DIAGNOSIS — J301 Allergic rhinitis due to pollen: Secondary | ICD-10-CM | POA: Insufficient documentation

## 2017-02-17 DIAGNOSIS — I5022 Chronic systolic (congestive) heart failure: Secondary | ICD-10-CM | POA: Insufficient documentation

## 2017-02-17 DIAGNOSIS — Z9109 Other allergy status, other than to drugs and biological substances: Secondary | ICD-10-CM

## 2017-02-17 DIAGNOSIS — E1122 Type 2 diabetes mellitus with diabetic chronic kidney disease: Secondary | ICD-10-CM | POA: Insufficient documentation

## 2017-02-17 DIAGNOSIS — Z905 Acquired absence of kidney: Secondary | ICD-10-CM | POA: Insufficient documentation

## 2017-02-17 DIAGNOSIS — Z95810 Presence of automatic (implantable) cardiac defibrillator: Secondary | ICD-10-CM | POA: Insufficient documentation

## 2017-02-17 DIAGNOSIS — G4733 Obstructive sleep apnea (adult) (pediatric): Secondary | ICD-10-CM | POA: Insufficient documentation

## 2017-02-17 DIAGNOSIS — E785 Hyperlipidemia, unspecified: Secondary | ICD-10-CM | POA: Insufficient documentation

## 2017-02-17 DIAGNOSIS — N183 Chronic kidney disease, stage 3 unspecified: Secondary | ICD-10-CM

## 2017-02-17 DIAGNOSIS — N200 Calculus of kidney: Secondary | ICD-10-CM | POA: Insufficient documentation

## 2017-02-17 DIAGNOSIS — F1290 Cannabis use, unspecified, uncomplicated: Secondary | ICD-10-CM | POA: Insufficient documentation

## 2017-02-17 DIAGNOSIS — I130 Hypertensive heart and chronic kidney disease with heart failure and stage 1 through stage 4 chronic kidney disease, or unspecified chronic kidney disease: Secondary | ICD-10-CM | POA: Insufficient documentation

## 2017-02-17 DIAGNOSIS — F419 Anxiety disorder, unspecified: Secondary | ICD-10-CM | POA: Insufficient documentation

## 2017-02-17 DIAGNOSIS — M109 Gout, unspecified: Secondary | ICD-10-CM | POA: Insufficient documentation

## 2017-02-17 DIAGNOSIS — Z794 Long term (current) use of insulin: Secondary | ICD-10-CM | POA: Insufficient documentation

## 2017-02-17 LAB — POCT UA - MICROALBUMIN

## 2017-02-17 LAB — GLUCOSE, POCT (MANUAL RESULT ENTRY): POC Glucose: 145 mg/dl — AB (ref 70–99)

## 2017-02-17 LAB — POCT GLYCOSYLATED HEMOGLOBIN (HGB A1C): Hemoglobin A1C: 6.2

## 2017-02-17 MED ORDER — COLCHICINE 0.6 MG PO TABS: 0.6000 mg | ORAL_TABLET | Freq: Two times a day (BID) | ORAL | 3 refills | Status: DC

## 2017-02-17 MED ORDER — TORSEMIDE 20 MG PO TABS: 20.0000 mg | ORAL_TABLET | Freq: Every day | ORAL | 3 refills | Status: DC

## 2017-02-17 MED ORDER — ISOSORBIDE MONONITRATE ER 30 MG PO TB24: 30.0000 mg | ORAL_TABLET | Freq: Every day | ORAL | 3 refills | Status: DC

## 2017-02-17 MED ORDER — CARVEDILOL 25 MG PO TABS: 25.0000 mg | ORAL_TABLET | Freq: Two times a day (BID) | ORAL | 3 refills | Status: DC

## 2017-02-17 MED ORDER — HYDRALAZINE HCL 50 MG PO TABS: 50.0000 mg | ORAL_TABLET | Freq: Three times a day (TID) | ORAL | 3 refills | Status: DC

## 2017-02-17 MED ORDER — LORATADINE 10 MG PO TABS
10.0000 mg | ORAL_TABLET | Freq: Every day | ORAL | 3 refills | Status: DC
Start: 2017-02-17 — End: 2017-11-01

## 2017-02-17 MED ORDER — ALLOPURINOL 100 MG PO TABS: 100.0000 mg | ORAL_TABLET | Freq: Every day | ORAL | 3 refills | Status: DC

## 2017-02-17 MED ORDER — ATORVASTATIN CALCIUM 80 MG PO TABS: ORAL_TABLET | ORAL | 3 refills | Status: DC

## 2017-02-17 MED ORDER — SACUBITRIL-VALSARTAN 49-51 MG PO TABS: 1.0000 | ORAL_TABLET | Freq: Two times a day (BID) | ORAL | 3 refills | Status: DC

## 2017-02-17 MED ORDER — INSULIN NPH ISOPHANE & REGULAR (70-30) 100 UNIT/ML ~~LOC~~ SUSP: 34.0000 [IU] | Freq: Two times a day (BID) | SUBCUTANEOUS | 3 refills | Status: DC

## 2017-02-17 MED ORDER — SPIRONOLACTONE 25 MG PO TABS: 25.0000 mg | ORAL_TABLET | Freq: Every day | ORAL | 3 refills | Status: DC

## 2017-02-17 MED FILL — ATORVASTATIN 80 MG TABLET: 80 | 30 days supply | Qty: 30 | Fill #0

## 2017-02-17 MED FILL — TORSEMIDE 20 MG TABLET: 20 | 30 days supply | Qty: 30 | Fill #0

## 2017-02-17 MED FILL — hydrALAZINE HCL 50 MG TABS: 50 | 30 days supply | Qty: 90 | Fill #0

## 2017-02-17 MED FILL — CARVEDILOL 25 MG TABLET: 25 | 30 days supply | Qty: 60 | Fill #0

## 2017-02-17 MED FILL — COLCHICINE 0.6 MG TABLET: 0.6 | 30 days supply | Qty: 60 | Fill #0

## 2017-02-17 NOTE — Progress Notes (Signed)
Patient is here for DM FU  Patient denies pain at this time.  Patient complains of bilateral knee and ankle pain in the morning. Patient states he Is on his feet all night.  Patient has taken medication today. Patient has eaten today.

## 2017-02-17 NOTE — Progress Notes (Signed)
Jeffrey [name], is a 41 y.o. male  STM:[id number]  [id number]:[id number]  DOB - 09-Dec-1975  Subjective:   Jeffrey [name] is a 41 y.o. male with a history of  poorly controlled hypertension, R renal cell carcinoma s/p nephrectomy, DM2, OSA, gout, morbid obesity and systolic HF due to presumed nonischemic cardiomyopathy here today for a follow up visit and medication refill. Has 13 children and 5 grandchildren, smokes marijuana but no cigarette, denies use of alcohol. Denies chest pain except occasional  discomfort around the AICD site. No SOB, no cough, denies being depressed. Patient has No headache, No chest pain, No abdominal pain - No Nausea, No new weakness tingling or numbness, No Cough - SOB. He requests medication for his seasonal allergy.  ECHO 01/16/2016 EF 20-25%  Grade III DD Echo 05/07/15.  EF 20-25%, grade II diastolic dysfunction, diffuse hypokinesis, moderate LV dilation, mild LVH, normal RV size with mildly decreased systolic function.   Echo in 2012 with EF 20-25%.  In 1/13 had nuclear study EF 45-50% suggestive of NICM.   Problem  Pollen Allergy  Dyslipidemia  Chronic Systolic Congestive Heart Failure (Hcc)  Gouty Arthritis  Type 2 Diabetes Mellitus With Stage 3 Chronic Kidney Disease, With Long-Term Current Use of Insulin (Hcc)   ALLERGIES: Allergies  Allergen Reactions  . Aspirin Shortness Of Breath, Itching and Rash     Burning sensation (Patient reports he tolerates other NSAIDS)   . Bee Venom Hives and Swelling  . Lisinopril Cough  . Tomato Rash   PAST MEDICAL HISTORY: Past Medical History:  Diagnosis Date  . Anxiety   . Aspirin allergy   . Childhood asthma   . Chronic systolic CHF (congestive heart failure) ([place])    a. EF 20-25% in 2012. b. EF 45-50% in 10/2011 with nonischemic nuc - presumed NICM. c. 12/2014 Echo: Sev depressed LV fxn, sev dil LV, mild LVH, mild MR, sev dil LA, mildly reduced RV fxn.  . CKD (chronic kidney disease) stage 2, GFR 60-89 ml/min   . High  cholesterol   . Hypertension   . Morbid obesity (Jeffrey [name])   . Nephrolithiasis   . OSA on CPAP   . Presumed NICM    a. 04/2014 Myoview: EF 26%, glob HK, sev glob HK, ? prior infarct;  b. Never cathed 2/2 CKD.  Jeffrey [name] Renal cell carcinoma (Jeffrey [name])    a. s/p Rt robotic assisted partial converted to radical nephrectomy on 01/2013.  . Troponin level elevated    a. 04/2014, 12/2014: felt due to CHF.  . Type II diabetes mellitus (Jeffrey [name])    MEDICATIONS AT HOME: Prior to Admission medications   Medication Sig Start Date End Date Taking? Authorizing Provider  allopurinol (ZYLOPRIM) 100 MG tablet Take 1 tablet (100 mg total) by mouth daily. 02/17/17   Tresa Garter, MD  atorvastatin (LIPITOR) 80 MG tablet TAKE 1 TABLET (80 MG TOTAL) BY MOUTH EVERY EVENING. 02/17/17   Tresa Garter, MD  carvedilol (COREG) 25 MG tablet Take 1 tablet (25 mg total) by mouth 2 (two) times daily with a meal. 02/17/17   Shelagh Rayman E, MD  colchicine 0.6 MG tablet Take 1 tablet (0.6 mg total) by mouth 2 (two) times daily. 02/17/17   Tresa Garter, MD  diazepam (VALIUM) 5 MG tablet Take 1 tablet (5 mg total) by mouth every 8 (eight) hours as needed (Vertigo. Take if symptoms are not relieved by meclizine (Antivert ) alone.). 02/11/16   [name], MD  hydrALAZINE (APRESOLINE) 50  MG tablet Take 1 tablet (50 mg total) by mouth 3 (three) times daily. 02/17/17   Tresa Garter, MD  insulin NPH-regular Human (NOVOLIN 70/30) (70-30) 100 UNIT/ML injection Inject 34 Units into the skin 2 (two) times daily with a meal. 02/17/17   [name], [name], MD  isosorbide mononitrate (IMDUR) 30 MG 24 hr tablet Take 1 tablet (30 mg total) by mouth daily. 02/17/17   Tresa Garter, MD  loratadine (CLARITIN) 10 MG tablet Take 1 tablet (10 mg total) by mouth daily. 02/17/17   Tresa Garter, MD  Magnesium Oxide 200 MG TABS Take 1 tablet (200 mg total) by mouth daily. 01/19/17   Shirley Friar, PA-C  meclizine  (ANTIVERT) 25 MG tablet Take 1 tablet (25 mg total) by mouth 3 (three) times daily as needed for dizziness (Take meclizine for spinning dizziness. You may also take Valium if your symptoms are not relieved by the meclizine alone.). 02/11/16   [name], MD  oxyCODONE-acetaminophen (PERCOCET/ROXICET) 5-325 MG tablet Take 2 tablets by mouth every 6 (six) hours as needed for severe pain. 05/07/16   [name], PA-C  sacubitril-valsartan (ENTRESTO) 49-51 MG Take 1 tablet by mouth 2 (two) times daily. 02/17/17   Tresa Garter, MD  spironolactone (ALDACTONE) 25 MG tablet Take 1 tablet (25 mg total) by mouth daily. 02/17/17   Tresa Garter, MD  torsemide (DEMADEX) 20 MG tablet Take 1 tablet (20 mg total) by mouth daily. 02/17/17   Tresa Garter, MD    Objective:  There were no vitals filed for this visit. Exam General appearance : Awake, alert, not in any distress. Speech Clear. Not toxic looking, obese HEENT: Atraumatic and Normocephalic, pupils equally reactive to light and accomodation Neck: Supple, no JVD. No cervical lymphadenopathy.  Chest: Good air entry bilaterally, no added sounds  CVS: S1 S2 regular, no murmurs.  Abdomen: Bowel sounds present, Non tender and not distended with no gaurding, rigidity or rebound. Extremities: B/L Lower Ext shows no edema, both legs are warm to touch Neurology: Awake alert, and oriented X 3, CN II-XII intact, Non focal Skin: No Rash  Data Review Lab Results  Component Value Date   HGBA1C 6.2 02/17/2017   HGBA1C 6.0 11/08/2015   HGBA1C 6.5 (H) 05/15/2015    Assessment & Plan   1. Type 2 diabetes mellitus with stage 3 chronic kidney disease, with long-term current use of insulin (HCC)  - POCT A1C - Glucose (CBG) - POCT UA - Microalbumin - Ambulatory referral to Ophthalmology - Ambulatory referral to Podiatry - insulin NPH-regular Human (NOVOLIN 70/30) (70-30) 100 UNIT/ML injection; Inject 34 Units into the skin 2 (two)  times daily with a meal.  Dispense: 3 vial; Refill: 3  2. Chronic systolic congestive heart failure (HCC)  - ECHOCARDIOGRAM COMPLETE; Future Refill - torsemide (DEMADEX) 20 MG tablet; Take 1 tablet (20 mg total) by mouth daily.  Dispense: 90 tablet; Refill: 3 - spironolactone (ALDACTONE) 25 MG tablet; Take 1 tablet (25 mg total) by mouth daily.  Dispense: 90 tablet; Refill: 3 - sacubitril-valsartan (ENTRESTO) 49-51 MG; Take 1 tablet by mouth 2 (two) times daily.  Dispense: 180 tablet; Refill: 3 - isosorbide mononitrate (IMDUR) 30 MG 24 hr tablet; Take 1 tablet (30 mg total) by mouth daily.  Dispense: 90 tablet; Refill: 3 - hydrALAZINE (APRESOLINE) 50 MG tablet; Take 1 tablet (50 mg total) by mouth 3 (three) times daily.  Dispense: 270 tablet; Refill: 3 - carvedilol (COREG) 25 MG tablet;  Take 1 tablet (25 mg total) by mouth 2 (two) times daily with a meal.  Dispense: 180 tablet; Refill: 3  3. Pollen allergy  - loratadine (CLARITIN) 10 MG tablet; Take 1 tablet (10 mg total) by mouth daily.  Dispense: 30 tablet; Refill: 3  4. Dyslipidemia  - atorvastatin (LIPITOR) 80 MG tablet; TAKE 1 TABLET (80 MG TOTAL) BY MOUTH EVERY EVENING.  Dispense: 90 tablet; Refill: 3  5. Gouty arthritis  - colchicine 0.6 MG tablet; Take 1 tablet (0.6 mg total) by mouth 2 (two) times daily.  Dispense: 180 tablet; Refill: 3 - allopurinol (ZYLOPRIM) 100 MG tablet; Take 1 tablet (100 mg total) by mouth daily.  Dispense: 90 tablet; Refill: 3  Patient have been counseled extensively about nutrition and exercise. Other issues discussed during this visit include: low cholesterol diet, weight control and daily exercise, foot care, annual eye examinations at Ophthalmology, importance of adherence with medications and regular follow-up. We also discussed long term complications of uncontrolled diabetes and hypertension.   Return in about 6 months (around 08/20/2017) for Hemoglobin A1C and Follow up, DM, Heart Failure and  Hypertension.  The patient was given clear instructions to go to ER or return to medical center if symptoms don't improve, worsen or new problems develop. The patient verbalized understanding. The patient was told to call to get lab results if they haven't heard anything in the next week.   This note has been created with Surveyor, quantity. Any transcriptional errors are unintentional.    Angelica Chessman, MD, [place], Karilyn Cota, [place] and [facility] [place], [place]   02/17/2017, 10:45 AM

## 2017-02-17 NOTE — Patient Instructions (Signed)
Diabetes Mellitus and Food It is important for you to manage your blood sugar (glucose) level. Your blood glucose level can be greatly affected by what you eat. Eating healthier foods in the appropriate amounts throughout the day at about the same time each day will help you control your blood glucose level. It can also help slow or prevent worsening of your diabetes mellitus. Healthy eating may even help you improve the level of your blood pressure and reach or maintain a healthy weight. General recommendations for healthful eating and cooking habits include:  Eating meals and snacks regularly. Avoid going long periods of time without eating to lose weight.  Eating a diet that consists mainly of plant-based foods, such as fruits, vegetables, nuts, legumes, and whole grains.  Using low-heat cooking methods, such as baking, instead of high-heat cooking methods, such as deep frying.  Work with your dietitian to make sure you understand how to use the Nutrition Facts information on food labels. How can food affect me? Carbohydrates Carbohydrates affect your blood glucose level more than any other type of food. Your dietitian will help you determine how many carbohydrates to eat at each meal and teach you how to count carbohydrates. Counting carbohydrates is important to keep your blood glucose at a healthy level, especially if you are using insulin or taking certain medicines for diabetes mellitus. Alcohol Alcohol can cause sudden decreases in blood glucose (hypoglycemia), especially if you use insulin or take certain medicines for diabetes mellitus. Hypoglycemia can be a life-threatening condition. Symptoms of hypoglycemia (sleepiness, dizziness, and disorientation) are similar to symptoms of having too much alcohol. If your health care provider has given you approval to drink alcohol, do so in moderation and use the following guidelines:  Women should not have more than one drink per day, and men  should not have more than two drinks per day. One drink is equal to: ? 12 oz of beer. ? 5 oz of wine. ? 1 oz of hard liquor.  Do not drink on an empty stomach.  Keep yourself hydrated. Have water, diet soda, or unsweetened iced tea.  Regular soda, juice, and other mixers might contain a lot of carbohydrates and should be counted.  What foods are not recommended? As you make food choices, it is important to remember that all foods are not the same. Some foods have fewer nutrients per serving than other foods, even though they might have the same number of calories or carbohydrates. It is difficult to get your body what it needs when you eat foods with fewer nutrients. Examples of foods that you should avoid that are high in calories and carbohydrates but low in nutrients include:  Trans fats (most processed foods list trans fats on the Nutrition Facts label).  Regular soda.  Juice.  Candy.  Sweets, such as cake, pie, doughnuts, and cookies.  Fried foods.  What foods can I eat? Eat nutrient-rich foods, which will nourish your body and keep you healthy. The food you should eat also will depend on several factors, including:  The calories you need.  The medicines you take.  Your weight.  Your blood glucose level.  Your blood pressure level.  Your cholesterol level.  You should eat a variety of foods, including:  Protein. ? Lean cuts of meat. ? Proteins low in saturated fats, such as fish, egg whites, and beans. Avoid processed meats.  Fruits and vegetables. ? Fruits and vegetables that may help control blood glucose levels, such as apples,   yams.  Dairy products.  Choose fat-free or low-fat dairy products, such as milk, yogurt, and cheese.  Grains, bread, pasta, and rice.  Choose whole grain products, such as multigrain bread, whole oats, and brown rice. These foods may help control blood pressure.  Fats.  Foods containing healthful fats, such as nuts,  avocado, olive oil, canola oil, and fish. Does everyone with diabetes mellitus have the same meal plan? Because every person with diabetes mellitus is different, there is not one meal plan that works for everyone. It is very important that you meet with a dietitian who will help you create a meal plan that is just right for you. This information is not intended to replace advice given to you by your health care provider. Make sure you discuss any questions you have with your health care provider. Document Released: 06/18/2005 Document Revised: 02/27/2016 Document Reviewed: 08/18/2013 Elsevier Interactive Patient Education  2017 [employer]. Diabetes Mellitus and Exercise Exercising regularly is important for your overall health, especially when you have diabetes (diabetes mellitus). Exercising is not only about losing weight. It has many health benefits, such as increasing muscle strength and bone density and reducing body fat and stress. This leads to improved fitness, flexibility, and endurance, all of which result in better overall health. Exercise has additional benefits for people with diabetes, including:  Reducing appetite.  Helping to lower and control blood glucose.  Lowering blood pressure.  Helping to control amounts of fatty substances (lipids) in the blood, such as cholesterol and triglycerides.  Helping the body to respond better to insulin (improving insulin sensitivity).  Reducing how much insulin the body needs.  Decreasing the risk for heart disease by:  Lowering cholesterol and triglyceride levels.  Increasing the levels of good cholesterol.  Lowering blood glucose levels. What is my activity plan? Your health care provider or certified diabetes educator can help you make a plan for the type and frequency of exercise (activity plan) that works for you. Make sure that you:  Do at least 150 minutes of moderate-intensity or vigorous-intensity exercise each week. This  could be brisk walking, biking, or water aerobics.  Do stretching and strength exercises, such as yoga or weightlifting, at least 2 times a week.  Spread out your activity over at least 3 days of the week.  Get some form of physical activity every day.  Do not go more than 2 days in a row without some kind of physical activity.  Avoid being inactive for more than 90 minutes at a time. Take frequent breaks to walk or stretch.  Choose a type of exercise or activity that you enjoy, and set realistic goals.  Start slowly, and gradually increase the intensity of your exercise over time. What do I need to know about managing my diabetes?  Check your blood glucose before and after exercising.  If your blood glucose is higher than 240 mg/dL (13.3 mmol/L) before you exercise, check your urine for ketones. If you have ketones in your urine, do not exercise until your blood glucose returns to normal.  Know the symptoms of low blood glucose (hypoglycemia) and how to treat it. Your risk for hypoglycemia increases during and after exercise. Common symptoms of hypoglycemia can include:  Hunger.  Anxiety.  Sweating and feeling clammy.  Confusion.  Dizziness or feeling light-headed.  Increased heart rate or palpitations.  Blurry vision.  Tingling or numbness around the mouth, lips, or tongue.  Tremors or shakes.  Irritability.  Keep a rapid-acting  carbohydrate snack available before, during, and after exercise to help prevent or treat hypoglycemia.  Avoid injecting insulin into areas of the body that are going to be exercised. For example, avoid injecting insulin into:  The arms, when playing tennis.  The legs, when jogging.  Keep records of your exercise habits. Doing this can help you and your health care provider adjust your diabetes management plan as needed. Write down:  Food that you eat before and after you exercise.  Blood glucose levels before and after you  exercise.  The type and amount of exercise you have done.  When your insulin is expected to peak, if you use insulin. Avoid exercising at times when your insulin is peaking.  When you start a new exercise or activity, work with your health care provider to make sure the activity is safe for you, and to adjust your insulin, medicines, or food intake as needed.  Drink plenty of water while you exercise to prevent dehydration or heat stroke. Drink enough fluid to keep your urine clear or pale yellow. This information is not intended to replace advice given to you by your health care provider. Make sure you discuss any questions you have with your health care provider. Document Released: 12/12/2003 Document Revised: 04/10/2016 Document Reviewed: 03/02/2016 Elsevier Interactive Patient Education  2017 [employer]. Blood Glucose Monitoring, Adult Monitoring your blood sugar (glucose) helps you manage your diabetes. It also helps you and your health care provider determine how well your diabetes management plan is working. Blood glucose monitoring involves checking your blood glucose as often as directed, and keeping a record (log) of your results over time. Why should I monitor my blood glucose? Checking your blood glucose regularly can:  Help you understand how food, exercise, illnesses, and medicines affect your blood glucose.  Let you know what your blood glucose is at any time. You can quickly tell if you are having low blood glucose (hypoglycemia) or high blood glucose (hyperglycemia).  Help you and your health care provider adjust your medicines as needed. When should I check my blood glucose? Follow instructions from your health care provider about how often to check your blood glucose. This may depend on:  The type of diabetes you have.  How well-controlled your diabetes is.  Medicines you are taking. If you have type 1 diabetes:   Check your blood glucose at least 2 times a  day.  Also check your blood glucose:  Before every insulin injection.  Before and after exercise.  Between meals.  2 hours after a meal.  Occasionally between 2:00 a.m. and 3:00 a.m., as directed.  Before potentially dangerous tasks, like driving or using heavy machinery.  At bedtime.  You may need to check your blood glucose more often, up to 6-10 times a day:  If you use an insulin pump.  If you need multiple daily injections (MDI).  If your diabetes is not well-controlled.  If you are ill.  If you have a history of severe hypoglycemia.  If you have a history of not knowing when your blood glucose is getting low (hypoglycemia unawareness). If you have type 2 diabetes:   If you take insulin or other diabetes medicines, check your blood glucose at least 2 times a day.  If you are on intensive insulin therapy, check your blood glucose at least 4 times a day. Occasionally, you may also need to check between 2:00 a.m. and 3:00 a.m., as directed.  Also check your blood glucose:  Before and after exercise.  Before potentially dangerous tasks, like driving or using heavy machinery.  You may need to check your blood glucose more often if:  Your medicine is being adjusted.  Your diabetes is not well-controlled.  You are ill. What is a blood glucose log?  A blood glucose log is a record of your blood glucose readings. It helps you and your health care provider:  Look for patterns in your blood glucose over time.  Adjust your diabetes management plan as needed.  Every time you check your blood glucose, write down your result and notes about things that may be affecting your blood glucose, such as your diet and exercise for the day.  Most glucose meters store a record of glucose readings in the meter. Some meters allow you to download your records to a computer. How do I check my blood glucose? Follow these steps to get accurate readings of your blood  glucose: Supplies needed    Blood glucose meter.  Test strips for your meter. Each meter has its own strips. You must use the strips that come with your meter.  A needle to prick your finger (lancet). Do not use lancets more than once.  A device that holds the lancet (lancing device).  A journal or log book to write down your results. Procedure   Wash your hands with soap and water.  Prick the side of your finger (not the tip) with the lancet. Use a different finger each time.  Gently rub the finger until a small drop of blood appears.  Follow instructions that come with your meter for inserting the test strip, applying blood to the strip, and using your blood glucose meter.  Write down your result and any notes. Alternative testing sites   Some meters allow you to use areas of your body other than your finger (alternative sites) to test your blood.  If you think you may have hypoglycemia, or if you have hypoglycemia unawareness, do not use alternative sites. Use your finger instead.  Alternative sites may not be as accurate as the fingers, because blood flow is slower in these areas. This means that the result you get may be delayed, and it may be different from the result that you would get from your finger.  The most common alternative sites are:  Forearm.  Thigh.  Palm of the hand. Additional tips   Always keep your supplies with you.  If you have questions or need help, all blood glucose meters have a 24-hour "hotline" number that you can call. You may also contact your health care provider.  After you use a few boxes of test strips, adjust (calibrate) your blood glucose meter by following instructions that came with your meter. This information is not intended to replace advice given to you by your health care provider. Make sure you discuss any questions you have with your health care provider. Document Released: 09/24/2003 Document Revised: 04/10/2016 Document  Reviewed: 03/02/2016 Elsevier Interactive Patient Education  2017 [employer].

## 2017-02-19 MED FILL — NOVOLIN 70/30 100 UNITS/ML: (70-30) 100 | 14 days supply | Qty: 10 | Fill #0

## 2017-02-19 MED FILL — **ENTRESTO 49-51 MG TABLET: 49-51 | 7 days supply | Qty: 14 | Fill #0

## 2017-03-09 ENCOUNTER — Encounter: Payer: Self-pay | Admitting: Internal Medicine

## 2017-03-22 ENCOUNTER — Ambulatory Visit: Payer: Self-pay | Admitting: Physician Assistant

## 2017-03-25 ENCOUNTER — Ambulatory Visit (HOSPITAL_COMMUNITY): Admission: RE | Admit: 2017-03-25 | Payer: Self-pay | Source: Ambulatory Visit

## 2017-03-25 ENCOUNTER — Inpatient Hospital Stay (HOSPITAL_COMMUNITY): Admission: RE | Admit: 2017-03-25 | Payer: Self-pay | Source: Ambulatory Visit | Admitting: Internal Medicine

## 2017-04-06 NOTE — Progress Notes (Signed)
Cardiology Office Note Date:  04/08/2017  Patient ID:  Jeffrey [name], Jeffrey [name] 30-Jan-1976, MRN [id number] PCP:  Tresa Garter, MD  Cardiologist:  Dr. Harrington Challenger CHF: Dr. [name] Electrophysiologist: Dr. Caryl Comes    Chief Complaint:  Annual EP visit/past due  History of Present Illness: Jeffrey [name] is a 41 y.o. male with history of HTN, renal ca s/p right nephrectomy, DM, OSA w/CPAP, morbid obesity, gout, chronic CHF (systolic), NICM w/ICD.  The patient comes today to be seen for Dr. Caryl Comes, last seen by him in Feb 2017, at that visit was observed to have an episode of VT, though not needed to be treated. Most recently seen by CHF team in April, his hydralazine was up-titrated.  Appears to have medicine compliance concerns.  He is feeling very well.  He denies any kind of CP, palpitations or exertional intolerances.  He is currently without his CPAP machine (quit working) though a replacement has already been requested and approved via his PMD.  He does not feel like he is retaining fluid, his weight steadily increases though he feels is his diet and over winter/spring tends to be less active.  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  He denies symptoms of PND or orthopnea, no dizziness, near syncope or syncope.   Device information: BSCi S-ICD implanted 05/17/15, Dr. Caryl Comes  Past Medical History:  Diagnosis Date  . Anxiety   . Aspirin allergy   . Childhood asthma   . Chronic systolic CHF (congestive heart failure) ([place])    a. EF 20-25% in 2012. b. EF 45-50% in 10/2011 with nonischemic nuc - presumed NICM. c. 12/2014 Echo: Sev depressed LV fxn, sev dil LV, mild LVH, mild MR, sev dil LA, mildly reduced RV fxn.  . CKD (chronic kidney disease) stage 2, GFR 60-89 ml/min   . High cholesterol   . Hypertension   . Morbid obesity ([place])   . Nephrolithiasis   . OSA on CPAP   . Presumed NICM    a. 04/2014 Myoview: EF 26%, glob HK, sev  glob HK, ? prior infarct;  b. Never cathed 2/2 CKD.  [name] Renal cell carcinoma ([place])    a. s/p Rt robotic assisted partial converted to radical nephrectomy on 01/2013.  . Troponin level elevated    a. 04/2014, 12/2014: felt due to CHF.  . Type II diabetes mellitus ([place])     Past Surgical History:  Procedure Laterality Date  . APPENDECTOMY  07/2004  . CARDIAC CATHETERIZATION N/A 05/17/2015   Procedure: Right/Left Heart Cath and Coronary Angiography;  Surgeon: Jolaine Artist, MD;  Location: [facility];  Service: Cardiovascular;  Laterality: N/A;  . EP IMPLANTABLE DEVICE N/A 05/17/2015   Procedure: SubQ ICD Implant;  Surgeon: Will Meredith Leeds, MD;  Location: [facility];  Service: Cardiovascular;  Laterality: N/A;  . ROBOTIC ASSISTED LAPAROSCOPIC LYSIS OF ADHESION  01/13/2013   Procedure: ROBOTIC ASSISTED LAPAROSCOPIC LYSIS OF ADHESION EXTENSIVE;  Surgeon: Alexis Frock, MD;  Location: [facility];  Service: Urology;;  . ROBOTIC ASSITED PARTIAL NEPHRECTOMY Right 01/13/2013   Procedure: ROBOTIC ASSITED PARTIAL NEPHRECTOMY CONVERTED TO ROBOTIC ASSISTED RIGHT RADICAL NEPHRECTOMY;  Surgeon: Alexis Frock, MD;  Location: [facility];  Service: Urology;  Laterality: Right;    Current Outpatient Prescriptions  Medication Sig Dispense Refill  . allopurinol (ZYLOPRIM) 100 MG tablet Take 1 tablet (100 mg total) by mouth daily. 90 tablet 3  .  atorvastatin (LIPITOR) 80 MG tablet TAKE 1 TABLET (80 MG TOTAL) BY MOUTH EVERY EVENING. 90 tablet 3  . carvedilol (COREG) 25 MG tablet Take 1 tablet (25 mg total) by mouth 2 (two) times daily with a meal. 180 tablet 3  . colchicine 0.6 MG tablet Take 1 tablet (0.6 mg total) by mouth 2 (two) times daily. 180 tablet 3  . diazepam (VALIUM) 5 MG tablet Take 1 tablet (5 mg total) by mouth every 8 (eight) hours as needed (Vertigo. Take if symptoms are not relieved by meclizine (Antivert ) alone.). 20 tablet 0  . hydrALAZINE (APRESOLINE) 50 MG tablet Take 1 tablet  (50 mg total) by mouth 3 (three) times daily. 270 tablet 3  . insulin NPH-regular Human (NOVOLIN 70/30) (70-30) 100 UNIT/ML injection Inject 34 Units into the skin 2 (two) times daily with a meal. 3 vial 3  . isosorbide mononitrate (IMDUR) 30 MG 24 hr tablet Take 1 tablet (30 mg total) by mouth daily. 90 tablet 3  . loratadine (CLARITIN) 10 MG tablet Take 1 tablet (10 mg total) by mouth daily. 30 tablet 3  . Magnesium Oxide 200 MG TABS Take 1 tablet (200 mg total) by mouth daily. 90 tablet 2  . meclizine (ANTIVERT) 25 MG tablet Take 1 tablet (25 mg total) by mouth 3 (three) times daily as needed for dizziness (Take meclizine for spinning dizziness. You may also take Valium if your symptoms are not relieved by the meclizine alone.). 30 tablet 0  . oxyCODONE-acetaminophen (PERCOCET/ROXICET) 5-325 MG tablet Take 2 tablets by mouth every 6 (six) hours as needed for severe pain. 15 tablet 0  . sacubitril-valsartan (ENTRESTO) 49-51 MG Take 1 tablet by mouth 2 (two) times daily. 180 tablet 3  . spironolactone (ALDACTONE) 25 MG tablet Take 1 tablet (25 mg total) by mouth daily. 90 tablet 3  . torsemide (DEMADEX) 20 MG tablet Take 1 tablet (20 mg total) by mouth daily. 90 tablet 3   No current facility-administered medications for this visit.     Allergies:   Aspirin; Bee venom; Lisinopril; and Tomato   Social History:  The patient  reports that he quit smoking about 1 years ago. He has a 5.50 pack-year smoking history. He has never used smokeless tobacco. He reports that he uses drugs, including Marijuana, about 14 times per week. He reports that he does not drink alcohol.   Family History:  The patient's family history includes Diabetes in his maternal aunt; Hypertension in his mother.  ROS:  Please see the history of present illness.    All other systems are reviewed and otherwise negative.   PHYSICAL EXAM: VS:  BP (!) 142/98   Pulse 75   Ht 5\' 7"  (1.702 m)   Wt (!) 317 lb (143.8 kg)   BMI  49.65 kg/m  BMI: Body mass index is 49.65 kg/m. Well nourished, well developed, in no acute distress  HEENT: normocephalic, atraumatic  Neck: no JVD, carotid bruits or masses Cardiac:  RRR; no significant murmurs, no rubs, or gallops Lungs:  CTA b/l, no wheezing, rhonchi or rales  Abd: soft, non-tender, obese MS: no deformity or atrophy Ext: no edema  Skin: warm and dry, no rash Neuro:  No gross deficits appreciated Psych: euthymic mood, full affect  S-ICD site is stable, no tethering or discomfort   EKG:  10/06/16: SR ICD interrogation done today by industry and reviewed by myslef: battery and electrode measurements are stable, no episodes treated or untreated  ECHO 01/16/2016  EF 20-25%  Grade III DD  [facility] 05/17/15 RA = 7 RV = 42/3/7 PA = 43/30 (39) PCW = 26 Fick cardiac output/index = 4.9/2.1 PVR = 2.7 WU SVR = 1651 FA sat = 95% PA sat = 65%, 65% Ao = 122/97 (109) LV = 110/31/33 1) Normal coronaries 2) Moderately elevated filling pressures with moderately depressed cardiac output  Recent Labs: 10/06/2016: B Natriuretic Peptide 549.6; Hemoglobin 15.4; Platelets 260 01/18/2017: BUN 16; Creatinine, Ser 1.60; Magnesium 1.5; Potassium 4.0; Sodium 139  No results found for requested labs within last 8760 hours.   CrCl cannot be calculated (Patient's most recent lab result is older than the maximum 21 days allowed.).   Wt Readings from Last 3 Encounters:  04/08/17 (!) 317 lb (143.8 kg)  01/18/17 (!) 311 lb 9.6 oz (141.3 kg)  11/23/16 (!) 317 lb (143.8 kg)     Other studies reviewed: Additional studies/records reviewed today include: summarized above  ASSESSMENT AND PLAN:  1. ICD     Stable function  2. NICM, Chronic CHF     No symptoms or exam findings to suggest fluid OL     Pt reports compliance with his medicines, and is reminded of the importance     Re-discussed daily weights and sadium restriction  3. HTN     High here, he did not take his medicine this  morning      4. Obesity     Discussed importance of weight loss/management    Disposition: F/u with Q70mo remote device checks and Dr. Belva Chimes in 1 year/sooner if needed.  Continue regular f/u with CHF clinic team.  Current medicines are reviewed at length with the patient today.  The patient did not have any concerns regarding medicines.  Haywood Lasso, PA-C 04/08/2017 9:21 AM     [address] [place] [address] [phone] (office)  [phone] (fax)

## 2017-04-08 ENCOUNTER — Ambulatory Visit (INDEPENDENT_AMBULATORY_CARE_PROVIDER_SITE_OTHER): Payer: Self-pay | Admitting: Physician Assistant

## 2017-04-08 VITALS — BP 142/98 | HR 75 | Ht 67.0 in | Wt 317.0 lb

## 2017-04-08 DIAGNOSIS — I5022 Chronic systolic (congestive) heart failure: Secondary | ICD-10-CM

## 2017-04-08 DIAGNOSIS — I420 Dilated cardiomyopathy: Secondary | ICD-10-CM

## 2017-04-08 DIAGNOSIS — I10 Essential (primary) hypertension: Secondary | ICD-10-CM

## 2017-04-08 DIAGNOSIS — Z95810 Presence of automatic (implantable) cardiac defibrillator: Secondary | ICD-10-CM

## 2017-04-08 NOTE — Patient Instructions (Signed)
Medication Instructions:   Your physician recommends that you continue on your current medications as directed. Please refer to the Current Medication list given to you today.   If you need a refill on your cardiac medications before your next appointment, please call your pharmacy.  Labwork: NONE ORDERED  TODAY    Testing/Procedures: NONE ORDERED  TODAY    Follow-Up:  Remote monitoring is used to monitor your Pacemaker of ICD from home. This monitoring reduces the number of office visits required to check your device to one time per year. It allows [place] to keep an eye on the functioning of your device to ensure it is working properly. You are scheduled for a device check from home on .10-8-2018You may send your transmission at any time that day. If you have a wireless device, the transmission will be sent automatically. After your physician reviews your transmission, you will receive a postcard with your next transmission date.     Your physician wants you to follow-up in: [place] will receive a reminder letter in the mail two months in advance. If you don't receive a letter, please call our office to schedule the follow-up appointment.     Any Other Special Instructions Will Be Listed Below (If Applicable).

## 2017-05-20 ENCOUNTER — Other Ambulatory Visit: Payer: Self-pay | Admitting: Physician Assistant

## 2017-05-24 MED FILL — TORSEMIDE 20 MG TABLET: 20 | 30 days supply | Qty: 30 | Fill #1

## 2017-06-02 ENCOUNTER — Ambulatory Visit (HOSPITAL_COMMUNITY)
Admission: RE | Admit: 2017-06-02 | Discharge: 2017-06-02 | Disposition: A | Discharge status: Home / Self Care | Payer: Self-pay | Source: Ambulatory Visit | Attending: Internal Medicine | Admitting: Internal Medicine

## 2017-06-02 ENCOUNTER — Ambulatory Visit (HOSPITAL_BASED_OUTPATIENT_CLINIC_OR_DEPARTMENT_OTHER)
Admission: RE | Admit: 2017-06-02 | Discharge: 2017-06-02 | Disposition: A | Discharge status: Home / Self Care | Payer: Self-pay | Source: Ambulatory Visit | Attending: Internal Medicine | Admitting: Internal Medicine

## 2017-06-02 VITALS — BP 140/90 | HR 78 | Wt 312.5 lb

## 2017-06-02 DIAGNOSIS — Z886 Allergy status to analgesic agent status: Secondary | ICD-10-CM | POA: Insufficient documentation

## 2017-06-02 DIAGNOSIS — Z95810 Presence of automatic (implantable) cardiac defibrillator: Secondary | ICD-10-CM | POA: Insufficient documentation

## 2017-06-02 DIAGNOSIS — Z87442 Personal history of urinary calculi: Secondary | ICD-10-CM | POA: Insufficient documentation

## 2017-06-02 DIAGNOSIS — I10 Essential (primary) hypertension: Secondary | ICD-10-CM

## 2017-06-02 DIAGNOSIS — F419 Anxiety disorder, unspecified: Secondary | ICD-10-CM | POA: Insufficient documentation

## 2017-06-02 DIAGNOSIS — M109 Gout, unspecified: Secondary | ICD-10-CM | POA: Insufficient documentation

## 2017-06-02 DIAGNOSIS — I5022 Chronic systolic (congestive) heart failure: Secondary | ICD-10-CM

## 2017-06-02 DIAGNOSIS — Z85528 Personal history of other malignant neoplasm of kidney: Secondary | ICD-10-CM | POA: Insufficient documentation

## 2017-06-02 DIAGNOSIS — I130 Hypertensive heart and chronic kidney disease with heart failure and stage 1 through stage 4 chronic kidney disease, or unspecified chronic kidney disease: Secondary | ICD-10-CM | POA: Insufficient documentation

## 2017-06-02 DIAGNOSIS — G4733 Obstructive sleep apnea (adult) (pediatric): Secondary | ICD-10-CM | POA: Insufficient documentation

## 2017-06-02 DIAGNOSIS — Z79899 Other long term (current) drug therapy: Secondary | ICD-10-CM | POA: Insufficient documentation

## 2017-06-02 DIAGNOSIS — I428 Other cardiomyopathies: Secondary | ICD-10-CM | POA: Insufficient documentation

## 2017-06-02 DIAGNOSIS — Z794 Long term (current) use of insulin: Secondary | ICD-10-CM | POA: Insufficient documentation

## 2017-06-02 DIAGNOSIS — Z905 Acquired absence of kidney: Secondary | ICD-10-CM | POA: Insufficient documentation

## 2017-06-02 DIAGNOSIS — Z6841 Body Mass Index (BMI) 40.0 and over, adult: Secondary | ICD-10-CM | POA: Insufficient documentation

## 2017-06-02 DIAGNOSIS — E7800 Pure hypercholesterolemia, unspecified: Secondary | ICD-10-CM | POA: Insufficient documentation

## 2017-06-02 DIAGNOSIS — N183 Chronic kidney disease, stage 3 (moderate): Secondary | ICD-10-CM | POA: Insufficient documentation

## 2017-06-02 DIAGNOSIS — E1122 Type 2 diabetes mellitus with diabetic chronic kidney disease: Secondary | ICD-10-CM | POA: Insufficient documentation

## 2017-06-02 LAB — BASIC METABOLIC PANEL
ANION GAP: 8 (ref 5–15)
BUN: 22 mg/dL — ABNORMAL HIGH (ref 6–20)
CALCIUM: 9.6 mg/dL (ref 8.9–10.3)
CO2: 21 mmol/L — ABNORMAL LOW (ref 22–32)
CREATININE: 1.87 mg/dL — AB (ref 0.61–1.24)
Chloride: 108 mmol/L (ref 101–111)
GFR calc Af Amer: 50 mL/min — ABNORMAL LOW (ref >60)
GFR, EST NON AFRICAN AMERICAN: 43 mL/min — AB (ref >60)
Glucose, Bld: 112 mg/dL — ABNORMAL HIGH (ref 65–99)
POTASSIUM: 4.5 mmol/L (ref 3.5–5.1)
Sodium: 137 mmol/L (ref 135–145)

## 2017-06-02 MED ORDER — HYDRALAZINE HCL 50 MG PO TABS: 75.0000 mg | ORAL_TABLET | Freq: Three times a day (TID) | ORAL | 3 refills | Status: DC

## 2017-06-02 MED FILL — hydrALAZINE HCL 50 MG TABS: 50 | 30 days supply | Qty: 135 | Fill #0

## 2017-06-02 NOTE — Progress Notes (Signed)
Patient ID: [name], male   DOB: September 29, 1976, 41 y.o.   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    Admitted to [facility] in 3/16 secondary to atypical chest pain, dyspnea, and mild troponin elevation. Echo during hospitalization shows "severely reduced LV function" - EF not quantified due to poor windows. He was diuresed and was down about 7 pounds at discharge, at 289 on the hospital scale.   Admitted 05/14/15 with CP at rest. CXR looked improved from previous. CP resolved spontaneously after hospitalization and troponin was flat. R/LHC showed normal coronaries and moderately elevated filling pressures with moderately depressed cardiac output. He had been previously seen by EP and approved for ICD with chronic low EF. [place] subcutaneous ICD was implanted 05/17/15.  He was diuresed with IV lasix 40 mg. Discharge weight 290 lb.  Admitted 4/11 through 01/16/16 with volume overload in the setting of missed medications. Diuresed with IV lasix and transitioned to torsemide 20 mg daily. Discharge weight was 293 pounds.   Today he returns for HF follow up. Overall feeling ok. Denies SOB/PND/Orthopnea. Says he can do whatever he wants. Complaining of knee pain. Weight at home 310-312 pounds. Taking all medications. Denies syncope/presyncope. Working part time at [employer] Tuesday.   ECHO 01/16/2016 EF 20-25%  Grade III DD Echo 05/07/15.  EF 20-25%, grade II diastolic dysfunction, diffuse hypokinesis, moderate LV dilation, mild LVH, normal RV size with mildly decreased systolic function.     Echo in 2012 with EF 20-25%.  In 1/13 had nuclear study EF 45-50% suggestive of NICM.   [facility] 05/17/15 RA = 7 RV = 42/3/7 PA = 43/30 (39) PCW = 26 Fick cardiac output/index = 4.9/2.1 PVR = 2.7 WU SVR = 1651 FA sat = 95% PA sat = 65%, 65% Ao = 122/97 (109) LV = 110/31/33 1) Normal coronaries 2) Moderately elevated filling pressures with moderately depressed cardiac output     Past Medical History:  Diagnosis Date  . Anxiety   . Aspirin allergy   . Childhood asthma   . Chronic systolic CHF (congestive heart failure) ([place])    a. EF 20-25% in 2012. b. EF 45-50% in 10/2011 with nonischemic nuc - presumed NICM. c. 12/2014 Echo: Sev depressed LV fxn, sev dil LV, mild LVH, mild MR, sev dil LA, mildly reduced RV fxn.  . CKD (chronic kidney disease) stage 2, GFR 60-89 ml/min   . High cholesterol   . Hypertension   . Morbid obesity ([place])   . Nephrolithiasis   . OSA on CPAP   . Presumed NICM    a. 04/2014 Myoview: EF 26%, glob HK, sev glob HK, ? prior infarct;  b. Never cathed 2/2 CKD.  [name] Renal cell carcinoma ([place])    a. s/p Rt robotic assisted partial converted to radical nephrectomy on 01/2013.  . Troponin level elevated    a. 04/2014, 12/2014: felt due to CHF.  . Type II diabetes mellitus ([place])    Past Surgical History:  Procedure Laterality Date  . APPENDECTOMY  07/2004  . CARDIAC CATHETERIZATION N/A 05/17/2015  Procedure: Right/Left Heart Cath and Coronary Angiography;  Surgeon: Jeffrey Artist, MD;  Location: [place] [facility];  Service: Cardiovascular;  Laterality: N/A;  . EP IMPLANTABLE DEVICE N/A 05/17/2015   Procedure: SubQ ICD Implant;  Surgeon: Jeffrey Meredith Leeds, MD;  Location: [place] [facility];  Service: Cardiovascular;  Laterality: N/A;  . ROBOTIC ASSISTED LAPAROSCOPIC LYSIS OF ADHESION  01/13/2013   Procedure: ROBOTIC ASSISTED LAPAROSCOPIC LYSIS OF ADHESION EXTENSIVE;  Surgeon: Jeffrey Frock, MD;  Location: [facility];  Service: Urology;;  . ROBOTIC ASSITED PARTIAL  NEPHRECTOMY Right 01/13/2013   Procedure: ROBOTIC ASSITED PARTIAL NEPHRECTOMY CONVERTED TO ROBOTIC ASSISTED RIGHT RADICAL NEPHRECTOMY;  Surgeon: Jeffrey Frock, MD;  Location: [facility];  Service: Urology;  Laterality: Right;    Allergies  Allergies  Allergen Reactions  . Aspirin Shortness Of Breath, Itching and Rash     Burning sensation (Patient reports he tolerates other NSAIDS)   . Bee Venom Hives and Swelling  . Lisinopril Cough  . Tomato Rash     Home Medications Current Outpatient Prescriptions  Medication Sig Dispense Refill  . allopurinol (ZYLOPRIM) 100 MG tablet Take 1 tablet (100 mg total) by mouth daily. 90 tablet 3  . atorvastatin (LIPITOR) 80 MG tablet TAKE 1 TABLET (80 MG TOTAL) BY MOUTH EVERY EVENING. 90 tablet 3  . carvedilol (COREG) 25 MG tablet Take 1 tablet (25 mg total) by mouth 2 (two) times daily with a meal. 180 tablet 3  . colchicine 0.6 MG tablet Take 1 tablet (0.6 mg total) by mouth 2 (two) times daily. 180 tablet 3  . diazepam (VALIUM) 5 MG tablet Take 1 tablet (5 mg total) by mouth every 8 (eight) hours as needed (Vertigo. Take if symptoms are not relieved by meclizine (Antivert ) alone.). 20 tablet 0  . hydrALAZINE (APRESOLINE) 50 MG tablet Take 1 tablet (50 mg total) by mouth 3 (three) times daily. 270 tablet 3  . insulin NPH-regular Human (NOVOLIN 70/30) (70-30) 100 UNIT/ML injection Inject 34 Units into the skin 2 (two) times daily with a meal. 3 vial 3  . isosorbide mononitrate (IMDUR) 30 MG 24 hr tablet Take 1 tablet (30 mg total) by mouth daily. 90 tablet 3  . loratadine (CLARITIN) 10 MG tablet Take 1 tablet (10 mg total) by mouth daily. 30 tablet 3  . Magnesium Oxide 200 MG TABS Take 1 tablet (200 mg total) by mouth daily. 90 tablet 2  . meclizine (ANTIVERT) 25 MG tablet Take 1 tablet (25 mg total) by mouth 3 (three) times daily as needed for dizziness (Take meclizine for spinning dizziness. You may also take Valium if your symptoms are not relieved by  the meclizine alone.). 30 tablet 0  . oxyCODONE-acetaminophen (PERCOCET/ROXICET) 5-325 MG tablet Take 2 tablets by mouth every 6 (six) hours as needed for severe pain. 15 tablet 0  . sacubitril-valsartan (ENTRESTO) 49-51 MG Take 1 tablet by mouth 2 (two) times daily. 180 tablet 3  . spironolactone (ALDACTONE) 25 MG tablet Take 1 tablet (25 mg total) by mouth daily. 90 tablet 3  . torsemide (DEMADEX) 20 MG tablet Take 1 tablet (20 mg total) by mouth daily. 90 tablet 3   No current facility-administered medications for this encounter.    Review of systems complete and found to be negative unless listed in HPI.    Physical Exam  VS:  BP 140/90 (BP Location: Right Wrist, Patient Position: Sitting, Cuff Size: Normal)   Pulse 78   Wt (!) 312 lb 8 oz (  141.7 kg)   SpO2 97%   BMI 48.94 kg/m  , Body mass index is 48.94 kg/m.   Wt Readings from Last 3 Encounters:  06/02/17 (!) 312 lb 8 oz (141.7 kg)  04/08/17 (!) 317 lb (143.8 kg)  01/18/17 (!) 311 lb 9.6 oz (141.3 kg)   General:  Well appearing. No resp difficulty HEENT: normal Neck: supple. no JVD. Carotids 2+ bilat; no bruits. No lymphadenopathy or thryomegaly appreciated. Cor: PMI nondisplaced. Regular rate & rhythm. No rubs, gallops or murmurs. Lungs: clear Abdomen: obese, soft, nontender, nondistended. No hepatosplenomegaly. No bruits or masses. Good bowel sounds. Extremities: no cyanosis, clubbing, rash, edema Neuro: alert & orientedx3, cranial nerves grossly intact. moves all 4 extremities w/o difficulty. Affect pleasant   Assessment & Plan 1.  Chronic systolic congestive heart failure: s/p [employer] subcutaneous ICD implant 05/17/15.  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 Echo 01/2016  EF 20-25%.  - Todays ECHO was discussed and reviewed by Dr [name]. EF today 25%.  - NYHA II.   - Continue torsemide 20 mg daily.   - Continue Entresto 49/51 mg BID. Jeffrey be careful with titration in setting of solitary kidney.  -  Continue coreg 25 mg bid.  - Increase hydralazine to 75 mg three times a day.    - Continue Imdur 30 mg daily.  - Continue spironolactone 25 mg daily  - Reinforced fluid restriction to < 2 L daily, sodium restriction to less than 2000 mg daily, and the importance of daily weights.   2. Hypertension:  - Elelvated. Increase hydralazine.     3. CKD stage III:  - BMET today.  4. OSA:  - Not using CPAP. CPAP machine broken. Message sent to [facility] regarding CPAP equipment.     5. Tobacco use: - Quit 1 year ago.  6. Gout:  - Continue allopurinol 200 daily for prophylaxis. No change to current plan.  7. DM2:  -Stable.Hgb A1C 6.2  8.Renal Cell Carcinoma - S/P nephrectomy 2014.  9. Morbid Obesity -Discussed portion control.    Follow up 3 months.   Darrick Grinder, NP  1:50 PM   Patient seen and examined with Darrick Grinder, NP. We discussed all aspects of the encounter. I agree with the assessment and plan as stated above.   Doing well NYHA II despite persistent severe LV dysfunction (echo reviewed personally in clinic today EF 25%. RV normal). Volume status ok. BP elevated. Jeffrey increase hydralazine. Can cona\sider Jardiance down the road.   Glori Bickers, MD  11:26 PM

## 2017-06-02 NOTE — Progress Notes (Signed)
  Echocardiogram 2D Echocardiogram has been performed.  Mustafa Potts 06/02/2017, 1:47 PM

## 2017-06-02 NOTE — Patient Instructions (Signed)
Labs drawn today  Increase Hydralazine to  75 mg (1.5 tab) , 3 times a day  Your physician recommends that you schedule a follow-up appointment in: 3 months

## 2017-06-05 ENCOUNTER — Encounter (HOSPITAL_COMMUNITY): Payer: Self-pay | Admitting: Emergency Medicine

## 2017-06-05 ENCOUNTER — Emergency Department (HOSPITAL_COMMUNITY)
Admission: EM | Admit: 2017-06-05 | Discharge: 2017-06-05 | Disposition: A | Discharge status: Home / Self Care | Payer: Self-pay | Attending: Emergency Medicine | Admitting: Emergency Medicine

## 2017-06-05 ENCOUNTER — Emergency Department (HOSPITAL_COMMUNITY): Payer: Self-pay

## 2017-06-05 DIAGNOSIS — Z87891 Personal history of nicotine dependence: Secondary | ICD-10-CM | POA: Insufficient documentation

## 2017-06-05 DIAGNOSIS — Z79899 Other long term (current) drug therapy: Secondary | ICD-10-CM | POA: Insufficient documentation

## 2017-06-05 DIAGNOSIS — Z794 Long term (current) use of insulin: Secondary | ICD-10-CM | POA: Insufficient documentation

## 2017-06-05 DIAGNOSIS — M170 Bilateral primary osteoarthritis of knee: Secondary | ICD-10-CM | POA: Insufficient documentation

## 2017-06-05 DIAGNOSIS — Z85528 Personal history of other malignant neoplasm of kidney: Secondary | ICD-10-CM | POA: Insufficient documentation

## 2017-06-05 DIAGNOSIS — N183 Chronic kidney disease, stage 3 (moderate): Secondary | ICD-10-CM | POA: Insufficient documentation

## 2017-06-05 DIAGNOSIS — E1122 Type 2 diabetes mellitus with diabetic chronic kidney disease: Secondary | ICD-10-CM | POA: Insufficient documentation

## 2017-06-05 DIAGNOSIS — I130 Hypertensive heart and chronic kidney disease with heart failure and stage 1 through stage 4 chronic kidney disease, or unspecified chronic kidney disease: Secondary | ICD-10-CM | POA: Insufficient documentation

## 2017-06-05 DIAGNOSIS — E669 Obesity, unspecified: Secondary | ICD-10-CM | POA: Insufficient documentation

## 2017-06-05 DIAGNOSIS — I5022 Chronic systolic (congestive) heart failure: Secondary | ICD-10-CM | POA: Insufficient documentation

## 2017-06-05 MED ORDER — TRAMADOL HCL 50 MG PO TABS
50.0000 mg | ORAL_TABLET | Freq: Once | ORAL | Status: AC
Start: 2017-06-05 — End: 2017-06-05
  Administered 2017-06-05: 50 mg via ORAL
  Filled 2017-06-05: qty 1

## 2017-06-05 MED ORDER — IBUPROFEN 400 MG PO TABS: 400.0000 mg | ORAL_TABLET | Freq: Once | ORAL | Status: DC

## 2017-06-05 MED ORDER — TRAMADOL HCL 50 MG PO TABS
50.0000 mg | ORAL_TABLET | Freq: Four times a day (QID) | ORAL | 0 refills | Status: DC | PRN
Start: 2017-06-05 — End: 2018-05-10

## 2017-06-05 NOTE — ED Notes (Signed)
Pt. Given a cup of ice.

## 2017-06-05 NOTE — ED Provider Notes (Signed)
[place] DEPT Provider Note   CSN: [id number] Arrival date & time: 06/05/17  1240     History   Chief Complaint Chief Complaint  Patient presents with  . Joint Swelling    HPI Jeffrey [name] is a 41 y.o. male.  Patient c/o bilateral knee pain for past couple years. States slowly getting worse. Worse when on feet all day. Works as Training and development officer. Denies recent injury. No leg swelling. Pain is moderate-sev, persistent. No other joint pain or swelling. No fever or chills. No numbness/weakness.    The history is provided by the patient.    Past Medical History:  Diagnosis Date  . Anxiety   . Aspirin allergy   . Childhood asthma   . Chronic systolic CHF (congestive heart failure) ([place])    a. EF 20-25% in 2012. b. EF 45-50% in 10/2011 with nonischemic nuc - presumed NICM. c. 12/2014 Echo: Sev depressed LV fxn, sev dil LV, mild LVH, mild MR, sev dil LA, mildly reduced RV fxn.  . CKD (chronic kidney disease) stage 2, GFR 60-89 ml/min   . High cholesterol   . Hypertension   . Morbid obesity ([place])   . Nephrolithiasis   . OSA on CPAP   . Presumed NICM    a. 04/2014 Myoview: EF 26%, glob HK, sev glob HK, ? prior infarct;  b. Never cathed 2/2 CKD.  [name] Renal cell carcinoma ([place])    a. s/p Rt robotic assisted partial converted to radical nephrectomy on 01/2013.  . Troponin level elevated    a. 04/2014, 12/2014: felt due to CHF.  . Type II diabetes mellitus [facility])     Patient Active Problem List   Diagnosis Date Noted  . Pollen allergy 02/17/2017  . Dyslipidemia 02/17/2017  . Hematochezia 11/08/2015  . CKD (chronic kidney disease), stage III 06/20/2015  . Cardiac device in situ, other   . Chronic systolic congestive heart failure ([place]) 03/11/2015  . Gouty arthritis 03/11/2015  . Chest pain 12/28/2014  . Aspirin allergy 04/08/2014  . Renal cell carcinoma ([place])   . Gout attack 11/10/2011  . Morbid obesity ([place]) 10/23/2011  . OSA (obstructive sleep apnea) 10/23/2011  . Type 2 diabetes  mellitus with stage 3 chronic kidney disease, with long-term current use of insulin ([place]) 10/22/2011  . [place], MILD 12/25/2010  . HYPERTENSION, BENIGN ESSENTIAL 12/25/2010    Past Surgical History:  Procedure Laterality Date  . APPENDECTOMY  07/2004  . CARDIAC CATHETERIZATION N/A 05/17/2015   Procedure: Right/Left Heart Cath and Coronary Angiography;  Surgeon: Jolaine Artist, MD;  Location: [place] [facility];  Service: Cardiovascular;  Laterality: N/A;  . EP IMPLANTABLE DEVICE N/A 05/17/2015   Procedure: SubQ ICD Implant;  Surgeon: Will Meredith Leeds, MD;  Location: [place] [facility] LAB;  Service: Cardiovascular;  Laterality: N/A;  . ROBOTIC ASSISTED LAPAROSCOPIC LYSIS OF ADHESION  01/13/2013   Procedure: ROBOTIC ASSISTED LAPAROSCOPIC LYSIS OF ADHESION EXTENSIVE;  Surgeon: Alexis Frock, MD;  [facility];  Service: Urology;;  . ROBOTIC ASSITED PARTIAL NEPHRECTOMY Right 01/13/2013   Procedure: ROBOTIC ASSITED PARTIAL NEPHRECTOMY CONVERTED TO ROBOTIC ASSISTED RIGHT RADICAL NEPHRECTOMY;  Surgeon: Alexis Frock, MD;  [facility];  Service: Urology;  Laterality: Right;       Home Medications    Prior to Admission medications   Medication Sig Start Date End Date Taking? Authorizing Provider  allopurinol (ZYLOPRIM) 100 MG tablet Take 1 tablet (100 mg total) by mouth daily. 02/17/17   Tresa Garter, MD  atorvastatin (LIPITOR) 80 MG tablet TAKE 1 TABLET (80 MG TOTAL) BY MOUTH EVERY EVENING. 02/17/17   Tresa Garter, MD  carvedilol (COREG) 25 MG tablet Take 1 tablet (25 mg total) by mouth 2 (two) times daily with a meal. 02/17/17   Jegede, Olugbemiga E, MD  colchicine 0.6 MG tablet Take 1 tablet (0.6 mg total) by mouth 2 (two) times daily. 02/17/17   Tresa Garter, MD  diazepam (VALIUM) 5 MG tablet Take 1 tablet (5 mg total) by mouth every 8 (eight) hours as needed (Vertigo. Take if symptoms are not relieved by meclizine (Antivert ) alone.). 02/11/16   [name], MD  hydrALAZINE (APRESOLINE) 50 MG tablet Take 1.5 tablets (75 mg total) by mouth 3 (three) times daily. 06/02/17   Bensimhon, Shaune Pascal, MD  insulin NPH-regular Human (NOVOLIN 70/30) (70-30) 100 UNIT/ML injection Inject 34 Units into the skin 2 (two) times daily with a meal. 02/17/17   Jegede, [name], MD  isosorbide mononitrate (IMDUR) 30 MG 24 hr tablet Take 1 tablet (30 mg total) by mouth daily. 02/17/17   Tresa Garter, MD  loratadine (CLARITIN) 10 MG tablet Take 1 tablet (10 mg total) by mouth daily. 02/17/17   Tresa Garter, MD  Magnesium Oxide 200 MG TABS Take 1 tablet (200 mg total) by mouth daily. 01/19/17   Shirley Friar, PA-C  meclizine (ANTIVERT) 25 MG tablet Take 1 tablet (25 mg total) by mouth 3 (three) times daily as needed for dizziness (Take meclizine for spinning dizziness. You may also take Valium if your symptoms are not relieved by the meclizine alone.). 02/11/16   [name], MD  oxyCODONE-acetaminophen (PERCOCET/ROXICET) 5-325 MG tablet Take 2 tablets by mouth every 6 (six) hours as needed for severe pain. 05/07/16   [name], PA-C  sacubitril-valsartan (ENTRESTO) 49-51 MG Take 1 tablet by mouth 2 (two) times daily. 02/17/17   Tresa Garter, MD  spironolactone (ALDACTONE) 25 MG tablet Take 1 tablet (25 mg total) by mouth daily. 02/17/17   Tresa Garter, MD  torsemide (DEMADEX) 20 MG tablet Take 1 tablet (20 mg total) by mouth daily. 02/17/17   Tresa Garter, MD    Family History Family History  Problem Relation Age of Onset  . Hypertension Mother   . Diabetes Maternal Aunt   . Heart attack Neg Hx   . Stroke Neg Hx     Social History Social History  Substance Use Topics  . Smoking status: Former Smoker    Packs/day: 0.25    Years: 22.00    Quit date: 04/16/2015  . Smokeless tobacco: Never Used  . Alcohol use No     Comment: occ      Allergies   Aspirin; Bee venom; Lisinopril; and Tomato   Review of  Systems Review of Systems  Constitutional: Negative for fever.  HENT: Negative for sore throat.   Respiratory: Negative for shortness of breath.   Cardiovascular: Negative for chest pain.  Genitourinary: Negative for flank pain.  Musculoskeletal: Negative for back pain.  Skin: Negative for rash.  Neurological: Negative for weakness, numbness and headaches.     Physical Exam Updated Vital Signs BP (!) 121/93   Pulse 86   Temp 98.7 F (37.1 C) (Oral)   Resp 18   Ht 1.727 m (5\' 8" )   Wt (!) 140.6 kg (310 lb)   SpO2 98%   BMI 47.14 kg/m   Physical Exam  Constitutional: He appears well-developed and well-nourished.  No distress.  HENT:  Head: Atraumatic.  Eyes: Conjunctivae are normal.  Neck: Neck supple. No tracheal deviation present.  Cardiovascular: Normal rate and intact distal pulses.   Pulmonary/Chest: Effort normal. No accessory muscle usage. No respiratory distress.  Abdominal: He exhibits no distension.  Musculoskeletal: He exhibits no edema.  Good passive rom and bil hips and knees without pain. No effusion. No erythema or increased warmth to knees. Dp/pt 2+ bil. No leg edema.   Neurological: He is alert.  Steady gait.   Skin: Skin is warm and dry. No rash noted. He is not diaphoretic.  Psychiatric: He has a normal mood and affect.  Nursing note and vitals reviewed.    ED Treatments / Results  Labs (all labs ordered are listed, but only abnormal results are displayed) Labs Reviewed - No data to display  EKG  EKG Interpretation None       Radiology Dg Knee 2 Views Left  Result Date: 06/05/2017 CLINICAL DATA:  Chronic bilateral knee pain with swelling over the last 2 weeks, difficulty bearing weight EXAM: LEFT KNEE - 1-2 VIEW COMPARISON:  06/05/2017 FINDINGS: Mild tricompartmental osteoarthritis with slight joint space loss, sclerosis and bony spurring. Medial compartment is most affected. Normal alignment without acute osseous finding, fracture, or  effusion. No subluxation or dislocation. No definite soft tissue abnormality. IMPRESSION: Mild tricompartmental osteoarthritis without acute process by plain radiography Electronically Signed   By: Jerilynn Mages.  Shick M.D.   On: 06/05/2017 13:36   Dg Knee 2 Views Right  Result Date: 06/05/2017 CLINICAL DATA:  Bilateral knee pain and swelling EXAM: RIGHT KNEE - 1-2 VIEW COMPARISON:  06/05/2017 FINDINGS: Similar mild tricompartmental osteoarthritis, most pronounced in the medial compartment with joint space loss, sclerosis and bony spurring. No malalignment, fracture or acute osseous finding. No large joint effusion. No significant soft tissue abnormality. IMPRESSION: Mild tricompartmental osteoarthritis without acute finding by plain radiography. Electronically Signed   By: Jerilynn Mages.  Shick M.D.   On: 06/05/2017 13:37    Procedures Procedures (including critical care time)  Medications Ordered in ED Medications  ibuprofen (ADVIL,MOTRIN) tablet 400 mg (not administered)  traMADol (ULTRAM) tablet 50 mg (not administered)     Initial Impression / Assessment and Plan / ED Course  I have reviewed the triage vital signs and the nursing notes.  Pertinent labs & imaging results that were available during my care of the patient were reviewed by me and considered in my medical decision making (see chart for details).  No meds pta.   Xrays.  Reviewed nursing notes and prior charts for additional history.   Pt notes did a lot of squats/wt lifting, and played football when younger.  Significant arthritis on xrays.   No sign of infection.  Pt states has ride, does not have to drive.   Ultram po. Motrin po.    Final Clinical Impressions(s) / ED Diagnoses   Final diagnoses:  None    New Prescriptions New Prescriptions   No medications on file     Lajean Saver, MD 06/05/17 [id number]

## 2017-06-05 NOTE — ED Triage Notes (Signed)
Pt has bilateral knee swelling worse in the right knee. Pt denies any injury, states that this has happen before.

## 2017-06-05 NOTE — Discharge Instructions (Signed)
It was our pleasure to provide your ER care today - we hope that you feel better.  Your xrays show arthritis of your knees.  You may take ultram as need for pain - no driving when taking.  From the recent lab work ordered by your doctor, your kidney function tests were elevated (creatinine 1.87) - it is very important that you work with your doctor to optimize your blood pressure control.  Also, avoid taking any medications that are hard on the kidneys, such as anti-inflammatory type pain medication such as ibuprofen or naprosyn.  Follow up with your doctor this Tuesday for recheck of blood pressure - also discuss your kidney function test results then, as well as possible other adjustments to your medication.   For knee pain/arthritis, follow up with orthopedist in the next couple weeks - see referral - call office Tuesday AM to arrange appointment.

## 2017-06-08 ENCOUNTER — Encounter (HOSPITAL_COMMUNITY): Payer: Self-pay

## 2017-06-08 ENCOUNTER — Emergency Department (HOSPITAL_COMMUNITY): Payer: Self-pay

## 2017-06-08 ENCOUNTER — Other Ambulatory Visit: Payer: Self-pay

## 2017-06-08 ENCOUNTER — Inpatient Hospital Stay (HOSPITAL_COMMUNITY)
Admission: EM | Admit: 2017-06-08 | Discharge: 2017-06-13 | DRG: 308 | Disposition: A | Discharge status: Home / Self Care | Payer: Self-pay | Attending: Internal Medicine | Admitting: Internal Medicine

## 2017-06-08 DIAGNOSIS — Z6841 Body Mass Index (BMI) 40.0 and over, adult: Secondary | ICD-10-CM

## 2017-06-08 DIAGNOSIS — N183 Chronic kidney disease, stage 3 (moderate): Secondary | ICD-10-CM | POA: Diagnosis present

## 2017-06-08 DIAGNOSIS — E1122 Type 2 diabetes mellitus with diabetic chronic kidney disease: Secondary | ICD-10-CM | POA: Diagnosis present

## 2017-06-08 DIAGNOSIS — N179 Acute kidney failure, unspecified: Secondary | ICD-10-CM | POA: Diagnosis present

## 2017-06-08 DIAGNOSIS — M109 Gout, unspecified: Secondary | ICD-10-CM | POA: Diagnosis present

## 2017-06-08 DIAGNOSIS — Z79899 Other long term (current) drug therapy: Secondary | ICD-10-CM

## 2017-06-08 DIAGNOSIS — J9601 Acute respiratory failure with hypoxia: Secondary | ICD-10-CM | POA: Diagnosis present

## 2017-06-08 DIAGNOSIS — G4733 Obstructive sleep apnea (adult) (pediatric): Secondary | ICD-10-CM | POA: Diagnosis present

## 2017-06-08 DIAGNOSIS — I481 Persistent atrial fibrillation: Principal | ICD-10-CM | POA: Diagnosis present

## 2017-06-08 DIAGNOSIS — Z91018 Allergy to other foods: Secondary | ICD-10-CM

## 2017-06-08 DIAGNOSIS — Z87891 Personal history of nicotine dependence: Secondary | ICD-10-CM

## 2017-06-08 DIAGNOSIS — I959 Hypotension, unspecified: Secondary | ICD-10-CM | POA: Diagnosis present

## 2017-06-08 DIAGNOSIS — Z79891 Long term (current) use of opiate analgesic: Secondary | ICD-10-CM

## 2017-06-08 DIAGNOSIS — I4891 Unspecified atrial fibrillation: Secondary | ICD-10-CM

## 2017-06-08 DIAGNOSIS — E785 Hyperlipidemia, unspecified: Secondary | ICD-10-CM | POA: Diagnosis present

## 2017-06-08 DIAGNOSIS — I5023 Acute on chronic systolic (congestive) heart failure: Secondary | ICD-10-CM | POA: Diagnosis present

## 2017-06-08 DIAGNOSIS — I5022 Chronic systolic (congestive) heart failure: Secondary | ICD-10-CM | POA: Diagnosis present

## 2017-06-08 DIAGNOSIS — I420 Dilated cardiomyopathy: Secondary | ICD-10-CM | POA: Diagnosis present

## 2017-06-08 DIAGNOSIS — I130 Hypertensive heart and chronic kidney disease with heart failure and stage 1 through stage 4 chronic kidney disease, or unspecified chronic kidney disease: Secondary | ICD-10-CM | POA: Diagnosis present

## 2017-06-08 DIAGNOSIS — Z905 Acquired absence of kidney: Secondary | ICD-10-CM

## 2017-06-08 DIAGNOSIS — I480 Paroxysmal atrial fibrillation: Secondary | ICD-10-CM | POA: Diagnosis present

## 2017-06-08 DIAGNOSIS — E7800 Pure hypercholesterolemia, unspecified: Secondary | ICD-10-CM | POA: Diagnosis present

## 2017-06-08 DIAGNOSIS — R0600 Dyspnea, unspecified: Secondary | ICD-10-CM

## 2017-06-08 DIAGNOSIS — Z91030 Bee allergy status: Secondary | ICD-10-CM

## 2017-06-08 DIAGNOSIS — Z888 Allergy status to other drugs, medicaments and biological substances status: Secondary | ICD-10-CM

## 2017-06-08 DIAGNOSIS — Z886 Allergy status to analgesic agent status: Secondary | ICD-10-CM

## 2017-06-08 DIAGNOSIS — Z85528 Personal history of other malignant neoplasm of kidney: Secondary | ICD-10-CM

## 2017-06-08 DIAGNOSIS — Z794 Long term (current) use of insulin: Secondary | ICD-10-CM

## 2017-06-08 DIAGNOSIS — Z7901 Long term (current) use of anticoagulants: Secondary | ICD-10-CM

## 2017-06-08 DIAGNOSIS — Z9119 Patient's noncompliance with other medical treatment and regimen: Secondary | ICD-10-CM

## 2017-06-08 DIAGNOSIS — Z950 Presence of cardiac pacemaker: Secondary | ICD-10-CM

## 2017-06-08 DIAGNOSIS — M1710 Unilateral primary osteoarthritis, unspecified knee: Secondary | ICD-10-CM | POA: Diagnosis present

## 2017-06-08 LAB — BASIC METABOLIC PANEL
ANION GAP: 9 (ref 5–15)
BUN: 36 mg/dL — ABNORMAL HIGH (ref 6–20)
CALCIUM: 8.6 mg/dL — AB (ref 8.9–10.3)
CO2: 20 mmol/L — ABNORMAL LOW (ref 22–32)
Chloride: 108 mmol/L (ref 101–111)
Creatinine, Ser: 2.27 mg/dL — ABNORMAL HIGH (ref 0.61–1.24)
GFR, EST AFRICAN AMERICAN: 39 mL/min — AB (ref >60)
GFR, EST NON AFRICAN AMERICAN: 34 mL/min — AB (ref >60)
Glucose, Bld: 126 mg/dL — ABNORMAL HIGH (ref 65–99)
Potassium: 3.6 mmol/L (ref 3.5–5.1)
SODIUM: 137 mmol/L (ref 135–145)

## 2017-06-08 LAB — CBC
HCT: 41.6 % (ref 39.0–52.0)
HEMOGLOBIN: 13.3 g/dL (ref 13.0–17.0)
MCH: 24.7 pg — ABNORMAL LOW (ref 26.0–34.0)
MCHC: 32 g/dL (ref 30.0–36.0)
MCV: 77.3 fL — ABNORMAL LOW (ref 78.0–100.0)
Platelets: 302 10*3/uL (ref 150–400)
RBC: 5.38 MIL/uL (ref 4.22–5.81)
RDW: 17.6 % — ABNORMAL HIGH (ref 11.5–15.5)
WBC: 11 10*3/uL — AB (ref 4.0–10.5)

## 2017-06-08 LAB — I-STAT TROPONIN, ED: TROPONIN I, POC: 0.03 ng/mL (ref 0.00–0.08)

## 2017-06-08 MED ORDER — AMIODARONE HCL IN DEXTROSE 360-4.14 MG/200ML-% IV SOLN
30.0000 mg/h | INTRAVENOUS | Status: DC
  Administered 2017-06-09: 30 mg/h via INTRAVENOUS
  Filled 2017-06-08 (×3): qty 200

## 2017-06-08 MED ORDER — INSULIN ASPART 100 UNIT/ML ~~LOC~~ SOLN: 0.0000 [IU] | Freq: Every day | SUBCUTANEOUS | Status: DC

## 2017-06-08 MED ORDER — DILTIAZEM LOAD VIA INFUSION
10.0000 mg | Freq: Once | INTRAVENOUS | Status: DC
  Filled 2017-06-08: qty 10

## 2017-06-08 MED ORDER — TORSEMIDE 20 MG PO TABS
20.0000 mg | ORAL_TABLET | Freq: Every day | ORAL | Status: DC
  Administered 2017-06-09: 20 mg via ORAL
  Filled 2017-06-08: qty 1

## 2017-06-08 MED ORDER — ALPRAZOLAM 0.25 MG PO TABS
0.2500 mg | ORAL_TABLET | Freq: Three times a day (TID) | ORAL | Status: DC | PRN
  Administered 2017-06-08 – 2017-06-11 (×6): 0.25 mg via ORAL
  Filled 2017-06-08 (×6): qty 1

## 2017-06-08 MED ORDER — ATORVASTATIN CALCIUM 80 MG PO TABS
80.0000 mg | ORAL_TABLET | Freq: Every day | ORAL | Status: DC
  Administered 2017-06-09 – 2017-06-12 (×4): 80 mg via ORAL
  Filled 2017-06-08 (×4): qty 1

## 2017-06-08 MED ORDER — COLCHICINE 0.6 MG PO TABS
0.6000 mg | ORAL_TABLET | Freq: Two times a day (BID) | ORAL | Status: DC
  Administered 2017-06-09 – 2017-06-13 (×7): 0.6 mg via ORAL
  Filled 2017-06-08 (×7): qty 1

## 2017-06-08 MED ORDER — ONDANSETRON HCL 4 MG/2ML IJ SOLN
4.0000 mg | Freq: Four times a day (QID) | INTRAMUSCULAR | Status: DC | PRN
  Administered 2017-06-10 (×2): 4 mg via INTRAVENOUS
  Filled 2017-06-08 (×2): qty 2

## 2017-06-08 MED ORDER — HEPARIN BOLUS VIA INFUSION
4000.0000 [IU] | Freq: Once | INTRAVENOUS | Status: AC
  Administered 2017-06-08: 4000 [IU] via INTRAVENOUS
  Filled 2017-06-08: qty 4000

## 2017-06-08 MED ORDER — ACETAMINOPHEN 325 MG PO TABS: 650.0000 mg | ORAL_TABLET | ORAL | Status: DC | PRN

## 2017-06-08 MED ORDER — AMIODARONE HCL IN DEXTROSE 360-4.14 MG/200ML-% IV SOLN
60.0000 mg/h | INTRAVENOUS | Status: AC
  Administered 2017-06-08: 60 mg/h via INTRAVENOUS
  Filled 2017-06-08: qty 200

## 2017-06-08 MED ORDER — INSULIN ASPART 100 UNIT/ML ~~LOC~~ SOLN
0.0000 [IU] | Freq: Three times a day (TID) | SUBCUTANEOUS | Status: DC
  Administered 2017-06-09: 3 [IU] via SUBCUTANEOUS
  Administered 2017-06-09: 2 [IU] via SUBCUTANEOUS
  Administered 2017-06-09 – 2017-06-10 (×2): 3 [IU] via SUBCUTANEOUS
  Administered 2017-06-10: 2 [IU] via SUBCUTANEOUS
  Administered 2017-06-11: 8 [IU] via SUBCUTANEOUS
  Administered 2017-06-11: 3 [IU] via SUBCUTANEOUS
  Administered 2017-06-11: 2 [IU] via SUBCUTANEOUS
  Administered 2017-06-12: 3 [IU] via SUBCUTANEOUS
  Administered 2017-06-12 – 2017-06-13 (×3): 2 [IU] via SUBCUTANEOUS

## 2017-06-08 MED ORDER — MAGNESIUM OXIDE 400 (241.3 MG) MG PO TABS
200.0000 mg | ORAL_TABLET | Freq: Every day | ORAL | Status: DC
  Administered 2017-06-09 – 2017-06-13 (×5): 200 mg via ORAL
  Filled 2017-06-08 (×5): qty 1

## 2017-06-08 MED ORDER — AMIODARONE HCL IN DEXTROSE 360-4.14 MG/200ML-% IV SOLN
60.0000 mg/h | INTRAVENOUS | Status: DC
  Administered 2017-06-08: 60 mg/h via INTRAVENOUS

## 2017-06-08 MED ORDER — DILTIAZEM HCL 100 MG IV SOLR
5.0000 mg/h | INTRAVENOUS | Status: DC
  Filled 2017-06-08: qty 100

## 2017-06-08 MED ORDER — HEPARIN (PORCINE) IN NACL 100-0.45 UNIT/ML-% IJ SOLN
1500.0000 [IU]/h | INTRAMUSCULAR | Status: DC
  Administered 2017-06-08: 1400 [IU]/h via INTRAVENOUS
  Administered 2017-06-09 – 2017-06-10 (×4): 1500 [IU]/h via INTRAVENOUS
  Filled 2017-06-08 (×4): qty 250

## 2017-06-08 MED ORDER — AMIODARONE HCL IN DEXTROSE 360-4.14 MG/200ML-% IV SOLN
30.0000 mg/h | INTRAVENOUS | Status: DC
  Administered 2017-06-09 (×3): 30 mg/h via INTRAVENOUS
  Administered 2017-06-09: 60 mg/h via INTRAVENOUS
  Administered 2017-06-09: 30 mg/h via INTRAVENOUS
  Administered 2017-06-10 (×5): 60 mg/h via INTRAVENOUS
  Administered 2017-06-11 – 2017-06-12 (×3): 30 mg/h via INTRAVENOUS
  Filled 2017-06-08 (×9): qty 200

## 2017-06-08 MED ORDER — TRAMADOL HCL 50 MG PO TABS
50.0000 mg | ORAL_TABLET | Freq: Four times a day (QID) | ORAL | Status: DC | PRN
  Administered 2017-06-11: 50 mg via ORAL
  Filled 2017-06-08: qty 1

## 2017-06-08 MED ORDER — ALLOPURINOL 100 MG PO TABS
100.0000 mg | ORAL_TABLET | Freq: Every day | ORAL | Status: DC
  Administered 2017-06-09 – 2017-06-13 (×5): 100 mg via ORAL
  Filled 2017-06-08 (×5): qty 1

## 2017-06-08 MED ORDER — AMIODARONE LOAD VIA INFUSION
150.0000 mg | Freq: Once | INTRAVENOUS | Status: AC
  Administered 2017-06-08: 150 mg via INTRAVENOUS
  Filled 2017-06-08: qty 83.34

## 2017-06-08 NOTE — ED Triage Notes (Signed)
Pt reports chest tightness and shortness of breath that started last night. Hx of CHF. Took an extra dose of his Turosemide at home. Pt slightly tachypnic in triage, no distress noted.

## 2017-06-08 NOTE — Progress Notes (Signed)
ANTICOAGULATION CONSULT NOTE - Initial Consult  Pharmacy Consult for heparin Indication: atrial fibrillation  Allergies  Allergen Reactions  . Aspirin Shortness Of Breath, Itching and Rash     Burning sensation (Patient reports he tolerates other NSAIDS)   . Bee Venom Hives and Swelling  . Lisinopril Cough  . Tomato Rash    Patient Measurements:   Heparin Dosing Weight: 101 kg  Vital Signs: Temp: 98.3 F (36.8 C) (09/04 1939) Temp Source: Oral (09/04 1939) BP: 127/106 (09/04 1939) Pulse Rate: 112 (09/04 1939)  Labs:  Recent Labs  06/08/17 1829  HGB 13.3  HCT 41.6  PLT 302  CREATININE 2.27*    Estimated Creatinine Clearance: 58.3 mL/min (A) (by C-G formula based on SCr of 2.27 mg/dL (H)).   Medical History: Past Medical History:  Diagnosis Date  . Anxiety   . Aspirin allergy   . Childhood asthma   . Chronic systolic CHF (congestive heart failure) ([place])    a. EF 20-25% in 2012. b. EF 45-50% in 10/2011 with nonischemic nuc - presumed NICM. c. 12/2014 Echo: Sev depressed LV fxn, sev dil LV, mild LVH, mild MR, sev dil LA, mildly reduced RV fxn.  . CKD (chronic kidney disease) stage 2, GFR 60-89 ml/min   . High cholesterol   . Hypertension   . Morbid obesity ([place])   . Nephrolithiasis   . OSA on CPAP   . Presumed NICM    a. 04/2014 Myoview: EF 26%, glob HK, sev glob HK, ? prior infarct;  b. Never cathed 2/2 CKD.  [name] Renal cell carcinoma ([place])    a. s/p Rt robotic assisted partial converted to radical nephrectomy on 01/2013.  . Troponin level elevated    a. 04/2014, 12/2014: felt due to CHF.  . Type II diabetes mellitus ([place])      Assessment: 41 yo male with new onset AFib with RVR. Has known HF. SCr 1.8 > 2.2, eCrCl 50-60 ml/min, h/h , plts wnl.  Goal of Therapy:  Heparin level 0.3-0.7 units/ml Monitor platelets by anticoagulation protocol: Yes   Plan:  -Heparin bolus 4000 units x1 then 1400 units/hr -Daily HL, CBC -First level with AM labs  Harvel Quale 06/08/2017,7:52 PM

## 2017-06-08 NOTE — ED Provider Notes (Signed)
[facility] DEPT Provider Note   CSN: [id number] Arrival date & time: 06/08/17  1720     History   Chief Complaint Chief Complaint  Patient presents with  . Chest Pain    HPI Jeffrey [name] is a 41 y.o. male.  HPI   Jeffrey [name] is a 41yo male with a history of systolic CHF (EF 43-32%), IDDM, OSA, renal cell carcinoma (post radical nephrectomy), HTN, HLD, morbid obesity who presents to the Emergency Department for evaluation of chest tightness, dizziness, shortness of breath which started last night. He says that yesterday at about 9pm he started feeling palpitations in his chest and all the sudden felt dizzy, as if his chest was "tight and closing in" and short of breath. He says that he took an extra dose of Torsemide (20mg .) He says that he has been compliant with his heart failure medications, does note that he was drinking a lot of water as he was out in the sun barbecuing all day yesterday. He states that he tried to lay down flat last night, was having trouble breathing so laid on pillows to prop him up. He had trouble sleeping last night due to shortness of breath, denies cough or wheezing. He states that he has an ICD placed for an arrhythmia in the past. Denies ever having cardiac stents placed. Denies fever, leg swelling, abdominal pain, nausea, vomiting.   Past Medical History:  Diagnosis Date  . Anxiety   . Aspirin allergy   . Childhood asthma   . Chronic systolic CHF (congestive heart failure) ([place])    a. EF 20-25% in 2012. b. EF 45-50% in 10/2011 with nonischemic nuc - presumed NICM. c. 12/2014 Echo: Sev depressed LV fxn, sev dil LV, mild LVH, mild MR, sev dil LA, mildly reduced RV fxn.  . CKD (chronic kidney disease) stage 2, GFR 60-89 ml/min   . High cholesterol   . Hypertension   . Morbid obesity ([place])   . Nephrolithiasis   . OSA on CPAP   . Presumed NICM    a. 04/2014 Myoview: EF 26%, glob HK, sev glob HK, ? prior infarct;  b. Never cathed 2/2 CKD.  [name] Renal cell  carcinoma ([place])    a. s/p Rt robotic assisted partial converted to radical nephrectomy on 01/2013.  . Troponin level elevated    a. 04/2014, 12/2014: felt due to CHF.  . Type II diabetes mellitus [facility])     Patient Active Problem List   Diagnosis Date Noted  . Pollen allergy 02/17/2017  . Dyslipidemia 02/17/2017  . Hematochezia 11/08/2015  . CKD (chronic kidney disease), stage III 06/20/2015  . Cardiac device in situ, other   . Chronic systolic congestive heart failure ([place]) 03/11/2015  . Gouty arthritis 03/11/2015  . Chest pain 12/28/2014  . Aspirin allergy 04/08/2014  . Renal cell carcinoma ([place])   . Gout attack 11/10/2011  . Morbid obesity ([place]) 10/23/2011  . OSA (obstructive sleep apnea) 10/23/2011  . Type 2 diabetes mellitus with stage 3 chronic kidney disease, with long-term current use of insulin ([place]) 10/22/2011  . [place], MILD 12/25/2010  . HYPERTENSION, BENIGN ESSENTIAL 12/25/2010    Past Surgical History:  Procedure Laterality Date  . APPENDECTOMY  07/2004  . CARDIAC CATHETERIZATION N/A 05/17/2015   Procedure: Right/Left Heart Cath and Coronary Angiography;  Surgeon: Jolaine Artist, MD;  Location: [facility];  Service: Cardiovascular;  Laterality: N/A;  . EP IMPLANTABLE DEVICE N/A 05/17/2015   Procedure: SubQ ICD Implant;  Surgeon: Will Meredith Leeds, MD;  Location: [facility];  Service: Cardiovascular;  Laterality: N/A;  . ROBOTIC ASSISTED LAPAROSCOPIC LYSIS OF ADHESION  01/13/2013   Procedure: ROBOTIC ASSISTED LAPAROSCOPIC LYSIS OF ADHESION EXTENSIVE;  Surgeon: Alexis Frock, MD;  Location: [facility];  Service: Urology;;  . ROBOTIC ASSITED PARTIAL NEPHRECTOMY Right 01/13/2013   Procedure: ROBOTIC ASSITED PARTIAL NEPHRECTOMY CONVERTED TO ROBOTIC ASSISTED RIGHT RADICAL NEPHRECTOMY;  Surgeon: Alexis Frock, MD;  Location: [facility];  Service: Urology;  Laterality: Right;       Home Medications    Prior to Admission medications   Medication Sig  Start Date End Date Taking? Authorizing Provider  allopurinol (ZYLOPRIM) 100 MG tablet Take 1 tablet (100 mg total) by mouth daily. 02/17/17   Tresa Garter, MD  atorvastatin (LIPITOR) 80 MG tablet TAKE 1 TABLET (80 MG TOTAL) BY MOUTH EVERY EVENING. 02/17/17   Tresa Garter, MD  carvedilol (COREG) 25 MG tablet Take 1 tablet (25 mg total) by mouth 2 (two) times daily with a meal. 02/17/17   Jegede, Olugbemiga E, MD  colchicine 0.6 MG tablet Take 1 tablet (0.6 mg total) by mouth 2 (two) times daily. 02/17/17   Tresa Garter, MD  diazepam (VALIUM) 5 MG tablet Take 1 tablet (5 mg total) by mouth every 8 (eight) hours as needed (Vertigo. Take if symptoms are not relieved by meclizine (Antivert ) alone.). 02/11/16   [name], MD  hydrALAZINE (APRESOLINE) 50 MG tablet Take 1.5 tablets (75 mg total) by mouth 3 (three) times daily. 06/02/17   Bensimhon, Shaune Pascal, MD  insulin NPH-regular Human (NOVOLIN 70/30) (70-30) 100 UNIT/ML injection Inject 34 Units into the skin 2 (two) times daily with a meal. 02/17/17   Jegede, [name], MD  isosorbide mononitrate (IMDUR) 30 MG 24 hr tablet Take 1 tablet (30 mg total) by mouth daily. 02/17/17   Tresa Garter, MD  loratadine (CLARITIN) 10 MG tablet Take 1 tablet (10 mg total) by mouth daily. 02/17/17   Tresa Garter, MD  Magnesium Oxide 200 MG TABS Take 1 tablet (200 mg total) by mouth daily. 01/19/17   Shirley Friar, PA-C  meclizine (ANTIVERT) 25 MG tablet Take 1 tablet (25 mg total) by mouth 3 (three) times daily as needed for dizziness (Take meclizine for spinning dizziness. You may also take Valium if your symptoms are not relieved by the meclizine alone.). 02/11/16   [name], MD  oxyCODONE-acetaminophen (PERCOCET/ROXICET) 5-325 MG tablet Take 2 tablets by mouth every 6 (six) hours as needed for severe pain. 05/07/16   [name], PA-C  sacubitril-valsartan (ENTRESTO) 49-51 MG Take 1 tablet by mouth 2 (two) times  daily. 02/17/17   Tresa Garter, MD  spironolactone (ALDACTONE) 25 MG tablet Take 1 tablet (25 mg total) by mouth daily. 02/17/17   Tresa Garter, MD  torsemide (DEMADEX) 20 MG tablet Take 1 tablet (20 mg total) by mouth daily. 02/17/17   Tresa Garter, MD  traMADol (ULTRAM) 50 MG tablet Take 1 tablet (50 mg total) by mouth every 6 (six) hours as needed. 06/05/17   Lajean Saver, MD    Family History Family History  Problem Relation Age of Onset  . Hypertension Mother   . Diabetes Maternal Aunt   . Heart attack Neg Hx   . Stroke Neg Hx     Social History Social History  Substance Use Topics  . Smoking status: Former Smoker    Packs/day: 0.25  Years: 22.00    Quit date: 04/16/2015  . Smokeless tobacco: Never Used  . Alcohol use No     Comment: occ      Allergies   Aspirin; Bee venom; Lisinopril; and Tomato   Review of Systems Review of Systems  Constitutional: Negative for chills, fatigue and fever.  Eyes: Negative for visual disturbance.  Respiratory: Positive for chest tightness and shortness of breath. Negative for cough and wheezing.   Cardiovascular: Positive for palpitations. Negative for chest pain and leg swelling.  Gastrointestinal: Negative for abdominal pain, constipation, diarrhea, nausea and vomiting.  Genitourinary: Negative for dysuria.  Musculoskeletal: Negative for gait problem.  Skin: Negative for wound.  Neurological: Positive for light-headedness. Negative for weakness, numbness and headaches.  Psychiatric/Behavioral: The patient is nervous/anxious.   All other systems reviewed and are negative.    Physical Exam Updated Vital Signs BP 98/69 (BP Location: Right Arm)   Pulse (!) 119   Temp 98.5 F (36.9 C) (Oral)   Resp (!) 24   SpO2 100%   Physical Exam  Constitutional: He is oriented to person, place, and time. He appears well-developed and well-nourished.  In mild distress, appears anxious  HENT:  Head: Normocephalic  and atraumatic.  Mouth/Throat: Oropharynx is clear and moist.  Eyes: Pupils are equal, round, and reactive to light. EOM are normal. Right eye exhibits no discharge. Left eye exhibits no discharge.  Neck: Normal range of motion. Neck supple.  No JVD  Cardiovascular: Exam reveals no gallop and no friction rub.   No murmur heard. Tachycardic, irregularly irregular rate  Pulmonary/Chest: Breath sounds normal. No respiratory distress. He has no wheezes. He has no rales.  Abdominal: Soft. Bowel sounds are normal. There is no tenderness. There is no guarding.  No hepatojugular reflex  Musculoskeletal: Normal range of motion.  2+ bilateral radial pulses. 2+ bilateral DP and TP pulses.   Neurological: He is alert and oriented to person, place, and time. Coordination normal.  Skin: Skin is warm and dry. Capillary refill takes less than 2 seconds. No pallor.  No pitting edema noted.   Psychiatric: He has a normal mood and affect.     ED Treatments / Results  Labs (all labs ordered are listed, but only abnormal results are displayed) Labs Reviewed  CBC - Abnormal; Notable for the following:       Result Value   WBC 11.0 (*)    MCV 77.3 (*)    MCH 24.7 (*)    RDW 17.6 (*)    All other components within normal limits  BASIC METABOLIC PANEL  I-STAT TROPONIN, ED    EKG   Radiology Dg Chest 2 View  Result Date: 06/08/2017 CLINICAL DATA:  Chest tightness with shortness of breath EXAM: CHEST  2 VIEW COMPARISON:  October 06, 2016 FINDINGS: Lungs are clear. Heart is borderline prominent with pulmonary vascularity within normal limits. No adenopathy. No pneumothorax. No bone lesions. A pacemaker device is present on the left with the lead tip near the sternomanubrial junction anteriorly. IMPRESSION: No edema or consolidation.  Stable cardiac silhouette. Electronically Signed   By: Lowella Grip III M.D.   On: 06/08/2017 18:24    Procedures Procedures (including critical care  time)  Medications Ordered in ED Medications  amiodarone (NEXTERONE) 1.8 mg/mL load via infusion 150 mg (not administered)    Followed by  amiodarone (NEXTERONE PREMIX) 360-4.14 MG/200ML-% (1.8 mg/mL) IV infusion (not administered)    Followed by  amiodarone (NEXTERONE PREMIX) 360-4.14 MG/200ML-% (  1.8 mg/mL) IV infusion (not administered)  ALPRAZolam (XANAX) tablet 0.25 mg (not administered)     Initial Impression / Assessment and Plan / ED Course  I have reviewed the triage vital signs and the nursing notes.  Pertinent labs & imaging results that were available during my care of the patient were reviewed by me and considered in my medical decision making (see chart for details).    Patient is tachycardic (119bpm) and mildly hypotensive (98/69) on arrival. He is critically ill. EKG reveals afib, patient does not have history of this previously. Diltiazem load and drip started in ED for rate control and to improve cardiac output. Cardiology consulted and will admit patient for new onset afib in presence of dilated cardiomyopathy. Per cardiology recommendation, patient will be started on heparin drip in the ED.   Final Clinical Impressions(s) / ED Diagnoses   Final diagnoses:  Atrial fibrillation, unspecified type ([place])  Dilated cardiomyopathy [facility])    New Prescriptions New Prescriptions   No medications on file     Glyn Ade, PA-C 06/09/17 0040    Glyn Ade, PA-C 06/09/17 [id number]    Noemi Chapel, MD 06/09/17 [phone]

## 2017-06-08 NOTE — ED Provider Notes (Signed)
Ejection fraction of 20-25%, nonischemic dilated cardiomyopathy, patient has recently been cared for in the congestive heart failure clinic chronically and has  then following up with the appropriate specialist, recently seen and felt to be stable according to the patient. He has not had his defibrillator fire at all. He states that last night around 9:00 PM he developed acute onset of palpitations in his chest which has been persistent and associated with shortness of breath and lightheadedness. This is been persistent through the evening and the day, seems to be worse with laying flat and exertion, denies any chest pain and denies any significant swelling of the legs. He reports that when he is fluid overloaded he usually has orthopnea with a wet cough but has not had that recently. He denies taking any blood thinners but is taking his other medications as per prescribed.  On exam the patient is significant with tachycardic in atrial fibrillation with a rapid ventricular rate ranging between 140 and 180 bpm. He has weak thready radial artery pulses, no obvious JVD, no obvious peripheral edema. His lungs are clear except for some subtle rales at the bases but he is able to speak in full sentences and is only minimally tachypneic. His abdomen is soft and nontender though he is obese. His oropharynx is clear, mucous members are moist. His mentation is normal and he is able to follow commands without difficulty.  EKG reviewed showing atrial fibrillation with rapid ventricular rate, see interpretation below  Chest x-ray reviewed, labs ordered, the patient will need to have Cardizem with a drip, cardiology consultation and admission to a high level of care. The patient is critically ill with what appears to be atrial fibrillation with rapid ventricular rate. With his underlying cardiomyopathy with severe depressed ejection fraction he will most certainly need to be in the hospital tonight.  D/w Cardiology -  agreeable to cardizem and to heparin gtt Pt is critically ill.  CRITICAL CARE Performed by: Johnna Acosta Total critical care time: 35 minutes Critical care time was exclusive of separately billable procedures and treating other patients. Critical care was necessary to treat or prevent imminent or life-threatening deterioration. Critical care was time spent personally by me on the following activities: development of treatment plan with patient and/or surrogate as well as nursing, discussions with consultants, evaluation of patient's response to treatment, examination of patient, obtaining history from patient or surrogate, ordering and performing treatments and interventions, ordering and review of laboratory studies, ordering and review of radiographic studies, pulse oximetry and re-evaluation of patient's condition.   EKG Interpretation  Date/Time:  Tuesday June 08 2017 17:28:37 EDT Ventricular Rate:  165 PR Interval:    QRS Duration: 94 QT Interval:  324 QTC Calculation: 536 R Axis:   -117 Text Interpretation:   Critical Test Result: Arrhythmia Undetermined rhythm Right superior axis deviation Abnormal ECG Since last tracing Atrial fibrillation now present - short run of V tach present Abnormal ekg Confirmed by Noemi Chapel [phone]) on 06/08/2017 6:33:45 PM      Medical screening examination/treatment/procedure(s) were conducted as a shared visit with non-physician practitioner(s) and myself.  I personally evaluated the patient during the encounter.  Clinical Impression:   Final diagnoses:  Atrial fibrillation, unspecified type ([place])  Dilated cardiomyopathy ([facility])         Noemi Chapel, MD 06/09/17 [phone]

## 2017-06-08 NOTE — H&P (Signed)
Cardiology Consult Note   Primary Physician: No PCP Primary Cardiologist:  Dr. [name]   Reason for Consultation: Atrial fibrillation   HPI:    Jeffrey [name] is seen today for evaluation of atrial fibrillation at the request of Dr. Sabra Heck.   Jeffrey [name] is a 41 year old male with a past medical history of HTN, R renal cell carcinoma s/p nephrectomy, DM, OSA non compliant with CPAP, gout, morbid obesity, and chronic systolic CHF. EF 20-25% in Aug. 2018.   He was at a cookout yesterday, had a [place] burger and drank a lot of water. Soon after, developed chest tightness and SOB. Also felt some palpitations. Symptoms worsened overnight and into today. He presented to the ED and was found to be in Afib RVR. EGK shows Afib rate of 165. BP was initially soft with SBP of 80's. BP now improved. He is highly anxious. Weights stable at home. Taking all medications. He has not been using his CPAP for some time now.   Pertinent admission labs - creatinine 2.27, troponin 0.03.    Review of Systems: [y] = yes, [ ]  = no   General: Weight gain [ ] ; Weight loss [ ] ; Anorexia [ ] ; Fatigue [ ] ; Fever [ ] ; Chills [ ] ; Weakness [ y]  Cardiac: Chest pain/pressure Blue.Reese ]; Resting SOB [ ] ; Exertional SOB [ ] ; Orthopnea [ ] ; Pedal Edema [ ] ; Palpitations Blue.Reese ]; Syncope [ ] ; Presyncope [ ] ; Paroxysmal nocturnal dyspnea[ ]   Pulmonary: Cough [ ] ; Wheezing[ ] ; Hemoptysis[ ] ; Sputum [ ] ; Snoring [ ]   GI: Vomiting[ ] ; Dysphagia[ ] ; Melena[ ] ; Hematochezia [ ] ; Heartburn[ ] ; Abdominal pain [ ] ; Constipation [ ] ; Diarrhea [ ] ; BRBPR [ ]   GU: Hematuria[ ] ; Dysuria [ ] ; Nocturia[ ]   Vascular: Pain in legs with walking [ ] ; Pain in feet with lying flat [ ] ; Non-healing sores [ ] ; Stroke [ ] ; TIA [ ] ; Slurred speech [ ] ;  Neuro: Headaches[ ] ; Vertigo[ ] ; Seizures[ ] ; Paresthesias[ ] ;Blurred vision [ ] ; Diplopia [ ] ; Vision changes [ ]   Ortho/Skin: Arthritis [ ] ; Joint pain [ ] ; Muscle pain [ ] ; Joint swelling [ ] ; Back Pain [  ]; Rash [ ]   Psych: Depression[ ] ; Anxiety[ ]   Heme: Bleeding problems [ ] ; Clotting disorders [ ] ; Anemia [ ]   Endocrine: Diabetes [ ] ; Thyroid dysfunction[ ]   Home Medications Prior to Admission medications   Medication Sig Start Date End Date Taking? Authorizing Provider  allopurinol (ZYLOPRIM) 100 MG tablet Take 1 tablet (100 mg total) by mouth daily. 02/17/17   Tresa Garter, MD  atorvastatin (LIPITOR) 80 MG tablet TAKE 1 TABLET (80 MG TOTAL) BY MOUTH EVERY EVENING. 02/17/17   Tresa Garter, MD  carvedilol (COREG) 25 MG tablet Take 1 tablet (25 mg total) by mouth 2 (two) times daily with a meal. 02/17/17   Jegede, Olugbemiga E, MD  colchicine 0.6 MG tablet Take 1 tablet (0.6 mg total) by mouth 2 (two) times daily. 02/17/17   Tresa Garter, MD  diazepam (VALIUM) 5 MG tablet Take 1 tablet (5 mg total) by mouth every 8 (eight) hours as needed (Vertigo. Take if symptoms are not relieved by meclizine (Antivert ) alone.). 02/11/16   [name], MD  hydrALAZINE (APRESOLINE) 50 MG tablet Take 1.5 tablets (75 mg total) by mouth 3 (three) times daily. 06/02/17   Bensimhon, Shaune Pascal, MD  insulin NPH-regular Human (NOVOLIN 70/30) (70-30) 100 UNIT/ML injection Inject 34  Units into the skin 2 (two) times daily with a meal. 02/17/17   Tresa Garter, MD  isosorbide mononitrate (IMDUR) 30 MG 24 hr tablet Take 1 tablet (30 mg total) by mouth daily. 02/17/17   Tresa Garter, MD  loratadine (CLARITIN) 10 MG tablet Take 1 tablet (10 mg total) by mouth daily. 02/17/17   Tresa Garter, MD  Magnesium Oxide 200 MG TABS Take 1 tablet (200 mg total) by mouth daily. 01/19/17   Shirley Friar, PA-C  meclizine (ANTIVERT) 25 MG tablet Take 1 tablet (25 mg total) by mouth 3 (three) times daily as needed for dizziness (Take meclizine for spinning dizziness. You may also take Valium if your symptoms are not relieved by the meclizine alone.). 02/11/16   [name], MD    oxyCODONE-acetaminophen (PERCOCET/ROXICET) 5-325 MG tablet Take 2 tablets by mouth every 6 (six) hours as needed for severe pain. 05/07/16   [name], PA-C  sacubitril-valsartan (ENTRESTO) 49-51 MG Take 1 tablet by mouth 2 (two) times daily. 02/17/17   Tresa Garter, MD  spironolactone (ALDACTONE) 25 MG tablet Take 1 tablet (25 mg total) by mouth daily. 02/17/17   Tresa Garter, MD  torsemide (DEMADEX) 20 MG tablet Take 1 tablet (20 mg total) by mouth daily. 02/17/17   Tresa Garter, MD  traMADol (ULTRAM) 50 MG tablet Take 1 tablet (50 mg total) by mouth every 6 (six) hours as needed. 06/05/17   Lajean Saver, MD    Past Medical History: Past Medical History:  Diagnosis Date  . Anxiety   . Aspirin allergy   . Childhood asthma   . Chronic systolic CHF (congestive heart failure) ([place])    a. EF 20-25% in 2012. b. EF 45-50% in 10/2011 with nonischemic nuc - presumed NICM. c. 12/2014 Echo: Sev depressed LV fxn, sev dil LV, mild LVH, mild MR, sev dil LA, mildly reduced RV fxn.  . CKD (chronic kidney disease) stage 2, GFR 60-89 ml/min   . High cholesterol   . Hypertension   . Morbid obesity ([place])   . Nephrolithiasis   . OSA on CPAP   . Presumed NICM    a. 04/2014 Myoview: EF 26%, glob HK, sev glob HK, ? prior infarct;  b. Never cathed 2/2 CKD.  [name] Renal cell carcinoma ([place])    a. s/p Rt robotic assisted partial converted to radical nephrectomy on 01/2013.  . Troponin level elevated    a. 04/2014, 12/2014: felt due to CHF.  . Type II diabetes mellitus ([place])     Past Surgical History: Past Surgical History:  Procedure Laterality Date  . APPENDECTOMY  07/2004  . CARDIAC CATHETERIZATION N/A 05/17/2015   Procedure: Right/Left Heart Cath and Coronary Angiography;  Surgeon: Jolaine Artist, MD;  Location: [facility];  Service: Cardiovascular;  Laterality: N/A;  . EP IMPLANTABLE DEVICE N/A 05/17/2015   Procedure: SubQ ICD Implant;  Surgeon: Will Meredith Leeds, MD;   Location: [facility];  Service: Cardiovascular;  Laterality: N/A;  . ROBOTIC ASSISTED LAPAROSCOPIC LYSIS OF ADHESION  01/13/2013   Procedure: ROBOTIC ASSISTED LAPAROSCOPIC LYSIS OF ADHESION EXTENSIVE;  Surgeon: Alexis Frock, MD;  Location: [facility];  Service: Urology;;  . ROBOTIC ASSITED PARTIAL NEPHRECTOMY Right 01/13/2013   Procedure: ROBOTIC ASSITED PARTIAL NEPHRECTOMY CONVERTED TO ROBOTIC ASSISTED RIGHT RADICAL NEPHRECTOMY;  Surgeon: Alexis Frock, MD;  Location: [facility];  Service: Urology;  Laterality: Right;    Family History: Family History  Problem Relation Age of  Onset  . Hypertension Mother   . Diabetes Maternal Aunt   . Heart attack Neg Hx   . Stroke Neg Hx     Social History: Social History   Social History  . Marital status: Single    Spouse name: N/A  . Number of children: N/A  . Years of education: N/A   Social History Main Topics  . Smoking status: Former Smoker    Packs/day: 0.25    Years: 22.00    Quit date: 04/16/2015  . Smokeless tobacco: Never Used  . Alcohol use No     Comment: occ   . Drug use: Yes    Frequency: 14.0 times per week    Types: Marijuana     Comment: uses marijuana twice a day  . Sexual activity: Yes    Birth control/ protection: Condom   Other Topics Concern  . None   Social History Narrative  . None    Allergies:  Allergies  Allergen Reactions  . Aspirin Shortness Of Breath, Itching and Rash     Burning sensation (Patient reports he tolerates other NSAIDS)   . Bee Venom Hives and Swelling  . Lisinopril Cough  . Tomato Rash    Objective:    Vital Signs:   Temp:  [98.5 F (36.9 C)] 98.5 F (36.9 C) (09/04 1733) Pulse Rate:  [119] 119 (09/04 1733) Resp:  [24] 24 (09/04 1733) BP: (98)/(69) 98/69 (09/04 1733) SpO2:  [100 %] 100 % (09/04 1733)    Weight change: There were no vitals filed for this visit.  Intake/Output:  No intake or output data in the 24 hours ending 06/08/17 1935    Physical Exam      General:  Well appearing. No resp difficulty HEENT: normal Neck: supple. JVP 7-8 . Carotids 2+ bilat; no bruits. No lymphadenopathy or thyromegaly appreciated. Cor: PMI nondisplaced.Tachy, irregularly irregular. No rubs, gallops or murmurs. Lungs: clear bilaterally, normal effort.  Abdomen: Obese,soft, nontender, nondistended. No hepatosplenomegaly. No bruits or masses. Good bowel sounds. Extremities: no cyanosis, clubbing, rash, edema Neuro: alert & orientedx3, cranial nerves grossly intact. moves all 4 extremities w/o difficulty. Affect pleasant   Telemetry   Afib RVR rates 160 - personally reviewed.   EKG    Afib - personally reviewed.   Labs   Basic Metabolic Panel:  Recent Labs Lab 06/02/17 1452 06/08/17 1829  NA 137 137  K 4.5 3.6  CL 108 108  CO2 21* 20*  GLUCOSE 112* 126*  BUN 22* 36*  CREATININE 1.87* 2.27*  CALCIUM 9.6 8.6*    Liver Function Tests: No results for input(s): AST, ALT, ALKPHOS, BILITOT, PROT, ALBUMIN in the last 168 hours. No results for input(s): LIPASE, AMYLASE in the last 168 hours. No results for input(s): AMMONIA in the last 168 hours.  CBC:  Recent Labs Lab 06/08/17 1829  WBC 11.0*  HGB 13.3  HCT 41.6  MCV 77.3*  PLT 302    Cardiac Enzymes: No results for input(s): CKTOTAL, CKMB, CKMBINDEX, TROPONINI in the last 168 hours.  BNP: BNP (last 3 results)  Recent Labs  10/06/16 1242  BNP 549.6*    ProBNP (last 3 results) No results for input(s): PROBNP in the last 8760 hours.   CBG: No results for input(s): GLUCAP in the last 168 hours.  Coagulation Studies: No results for input(s): LABPROT, INR in the last 72 hours.   Imaging   Dg Chest 2 View  Result Date: 06/08/2017 CLINICAL DATA:  Chest tightness with  shortness of breath EXAM: CHEST  2 VIEW COMPARISON:  October 06, 2016 FINDINGS: Lungs are clear. Heart is borderline prominent with pulmonary vascularity within normal limits. No adenopathy. No pneumothorax. No  bone lesions. A pacemaker device is present on the left with the lead tip near the sternomanubrial junction anteriorly. IMPRESSION: No edema or consolidation.  Stable cardiac silhouette. Electronically Signed   By: Lowella Grip III M.D.   On: 06/08/2017 18:24     Medications:     Current Medications: . amiodarone  150 mg Intravenous Once    Infusions: . amiodarone     Followed by  . [START ON 06/09/2017] amiodarone        Patient Profile   Mr. Lamagna is a 41 year old male with a past medical history of HTN, R renal cell carcinoma s/p nephrectomy, DM, OSA non compliant with CPAP, gout, morbid obesity, and chronic systolic CHF. EF 20-25% in Aug. 2018. Presented with rapid Afib on 06/08/17. No prior history of Afib.    Assessment/Plan   1. New onset atrial fibrillation with RVR: Presented with rapid afib, symptoms began yesterday afternoon. Duration of symptoms > 24 hours. He has a history of NICM, EF 20-25%. Will start Amiodarone to control rate and try to convert him. Will start heparin gtt as well. With CKD will likely not be a DOAC candidate.  - Start Amio gtt with 150 mg bolus.  - Heparin gtt per pharmacy - Will interrogate device, no atrial lead so will only be able to detect elevated ventricular rate.  - Will need to consider TEE/DCCV if Amio does not convert him. His LA is severely dilated. Will need to load him with Amio to increase his chances of holding NSR.   2. Chronic systolic CHF: Presumed NICM. EF 20-25% in 05/2017 - Will stop HF meds tonight as he was hypotensive.  - Volume stable on exam - Watch for low output symptoms. His creatinine was 1.87 last week, now 2.27 and hypotensive. BP now improved. Extremities warm.  - BMET in the am.   3. OSA - Discussed importance of CPAP compliance.   4. Obesity    Length of Stay: [place], NP  06/08/2017, 7:35 PM  Advanced Heart Failure Team Pager [phone] (M-F; 7a - 4p)  Please contact [facility] for  night-coverage after hours (4p -7a ) and weekends on [email or web address]'

## 2017-06-09 ENCOUNTER — Other Ambulatory Visit: Payer: Self-pay

## 2017-06-09 ENCOUNTER — Inpatient Hospital Stay (HOSPITAL_COMMUNITY): Payer: Self-pay

## 2017-06-09 DIAGNOSIS — N179 Acute kidney failure, unspecified: Secondary | ICD-10-CM

## 2017-06-09 DIAGNOSIS — I5023 Acute on chronic systolic (congestive) heart failure: Secondary | ICD-10-CM

## 2017-06-09 LAB — CBC WITH DIFFERENTIAL/PLATELET
Basophils Absolute: 0.1 10*3/uL (ref 0.0–0.1)
Basophils Relative: 1 %
Eosinophils Absolute: 0.2 10*3/uL (ref 0.0–0.7)
Eosinophils Relative: 2 %
HEMATOCRIT: 40.3 % (ref 39.0–52.0)
HEMOGLOBIN: 13.3 g/dL (ref 13.0–17.0)
LYMPHS ABS: 3.5 10*3/uL (ref 0.7–4.0)
Lymphocytes Relative: 31 %
MCH: 25.1 pg — AB (ref 26.0–34.0)
MCHC: 33 g/dL (ref 30.0–36.0)
MCV: 76.2 fL — AB (ref 78.0–100.0)
MONO ABS: 0.7 10*3/uL (ref 0.1–1.0)
MONOS PCT: 6 %
NEUTROS ABS: 7 10*3/uL (ref 1.7–7.7)
NEUTROS PCT: 61 %
Platelets: 269 10*3/uL (ref 150–400)
RBC: 5.29 MIL/uL (ref 4.22–5.81)
RDW: 17.7 % — AB (ref 11.5–15.5)
WBC: 11.4 10*3/uL — ABNORMAL HIGH (ref 4.0–10.5)

## 2017-06-09 LAB — BASIC METABOLIC PANEL
Anion gap: 10 (ref 5–15)
BUN: 34 mg/dL — AB (ref 6–20)
CHLORIDE: 109 mmol/L (ref 101–111)
CO2: 18 mmol/L — AB (ref 22–32)
Calcium: 8.6 mg/dL — ABNORMAL LOW (ref 8.9–10.3)
Creatinine, Ser: 2.11 mg/dL — ABNORMAL HIGH (ref 0.61–1.24)
GFR calc Af Amer: 43 mL/min — ABNORMAL LOW (ref >60)
GFR calc non Af Amer: 37 mL/min — ABNORMAL LOW (ref >60)
Glucose, Bld: 134 mg/dL — ABNORMAL HIGH (ref 65–99)
POTASSIUM: 3.9 mmol/L (ref 3.5–5.1)
SODIUM: 137 mmol/L (ref 135–145)

## 2017-06-09 LAB — GLUCOSE, CAPILLARY
GLUCOSE-CAPILLARY: 148 mg/dL — AB (ref 65–99)
GLUCOSE-CAPILLARY: 130 mg/dL — AB (ref 65–99)
Glucose-Capillary: 153 mg/dL — ABNORMAL HIGH (ref 65–99)
Glucose-Capillary: 152 mg/dL — ABNORMAL HIGH (ref 65–99)
Glucose-Capillary: 153 mg/dL — ABNORMAL HIGH (ref 65–99)

## 2017-06-09 LAB — COMPREHENSIVE METABOLIC PANEL
ALBUMIN: 3.3 g/dL — AB (ref 3.5–5.0)
ALT: 58 U/L (ref 17–63)
AST: 45 U/L — AB (ref 15–41)
Alkaline Phosphatase: 48 U/L (ref 38–126)
Anion gap: 9 (ref 5–15)
BUN: 36 mg/dL — AB (ref 6–20)
CHLORIDE: 107 mmol/L (ref 101–111)
CO2: 21 mmol/L — ABNORMAL LOW (ref 22–32)
CREATININE: 2.35 mg/dL — AB (ref 0.61–1.24)
Calcium: 8.4 mg/dL — ABNORMAL LOW (ref 8.9–10.3)
GFR calc Af Amer: 38 mL/min — ABNORMAL LOW (ref >60)
GFR, EST NON AFRICAN AMERICAN: 32 mL/min — AB (ref >60)
GLUCOSE: 152 mg/dL — AB (ref 65–99)
POTASSIUM: 3.7 mmol/L (ref 3.5–5.1)
Sodium: 137 mmol/L (ref 135–145)
Total Bilirubin: 0.8 mg/dL (ref 0.3–1.2)
Total Protein: 6.6 g/dL (ref 6.5–8.1)

## 2017-06-09 LAB — MAGNESIUM
MAGNESIUM: 1.6 mg/dL — AB (ref 1.7–2.4)
MAGNESIUM: 2 mg/dL (ref 1.7–2.4)

## 2017-06-09 LAB — HIV ANTIBODY (ROUTINE TESTING W REFLEX): HIV Screen 4th Generation wRfx: NONREACTIVE

## 2017-06-09 LAB — HEPARIN LEVEL (UNFRACTIONATED): Heparin Unfractionated: 0.3 IU/mL (ref 0.30–0.70)

## 2017-06-09 LAB — TROPONIN I: TROPONIN I: 0.04 ng/mL — AB (ref <0.03)

## 2017-06-09 LAB — TSH: TSH: 4.276 u[IU]/mL (ref 0.350–4.500)

## 2017-06-09 LAB — BRAIN NATRIURETIC PEPTIDE: B Natriuretic Peptide: 1110.6 pg/mL — ABNORMAL HIGH (ref 0.0–100.0)

## 2017-06-09 MED ORDER — FUROSEMIDE 10 MG/ML IJ SOLN
80.0000 mg | Freq: Once | INTRAMUSCULAR | Status: AC
  Administered 2017-06-09: 80 mg via INTRAVENOUS

## 2017-06-09 MED ORDER — AMIODARONE IV BOLUS ONLY 150 MG/100ML
150.0000 mg | Freq: Once | INTRAVENOUS | Status: AC
  Administered 2017-06-09: 150 mg via INTRAVENOUS
  Filled 2017-06-09: qty 100

## 2017-06-09 MED ORDER — POTASSIUM CHLORIDE CRYS ER 20 MEQ PO TBCR
40.0000 meq | EXTENDED_RELEASE_TABLET | Freq: Once | ORAL | Status: AC
  Administered 2017-06-09: 40 meq via ORAL
  Filled 2017-06-09: qty 2

## 2017-06-09 MED ORDER — AMIODARONE LOAD VIA INFUSION
150.0000 mg | Freq: Once | INTRAVENOUS | Status: AC
  Administered 2017-06-09: 150 mg via INTRAVENOUS
  Filled 2017-06-09: qty 83.34

## 2017-06-09 MED ORDER — FUROSEMIDE 10 MG/ML IJ SOLN
INTRAMUSCULAR | Status: AC
  Filled 2017-06-09: qty 8

## 2017-06-09 MED ORDER — MAGNESIUM SULFATE 2 GM/50ML IV SOLN
2.0000 g | Freq: Once | INTRAVENOUS | Status: AC
  Administered 2017-06-09: 2 g via INTRAVENOUS
  Filled 2017-06-09: qty 50

## 2017-06-09 MED ORDER — FUROSEMIDE 10 MG/ML IJ SOLN
120.0000 mg | Freq: Once | INTRAVENOUS | Status: AC
  Administered 2017-06-10: 120 mg via INTRAVENOUS
  Filled 2017-06-09: qty 12

## 2017-06-09 MED ORDER — ALPRAZOLAM 0.25 MG PO TABS
0.2500 mg | ORAL_TABLET | Freq: Every evening | ORAL | Status: DC | PRN
  Administered 2017-06-09 – 2017-06-11 (×3): 0.25 mg via ORAL
  Filled 2017-06-09 (×3): qty 1

## 2017-06-09 MED ORDER — DIGOXIN 0.25 MG/ML IJ SOLN
0.5000 mg | Freq: Once | INTRAMUSCULAR | Status: AC
  Administered 2017-06-09: 0.5 mg via INTRAVENOUS
  Filled 2017-06-09: qty 2

## 2017-06-09 MED ORDER — ZOLPIDEM TARTRATE 5 MG PO TABS
5.0000 mg | ORAL_TABLET | Freq: Every evening | ORAL | Status: DC | PRN
  Administered 2017-06-09: 5 mg via ORAL
  Filled 2017-06-09: qty 1

## 2017-06-09 NOTE — Progress Notes (Signed)
   Called by Staff nurse.   Mr Callanan is complaining of chest tightness and dyspnea .   Over the last few hours he has had A fib RVR with episodes 130-150s. 85/66. Respirations 28. O2 sats 98%. Currently on amio drip at 30 mg per hour.   Mag 1.6  K 3.7   Give 2 grams mag now, 40 meq K po now, and give 150 mg amio bolus. Cycle troponin. Stat CXR. JVP appears elevated. Give 80 mg IV lasix now.    Kariann Wecker NP-C  3:29 PM

## 2017-06-09 NOTE — Progress Notes (Addendum)
ANTICOAGULATION CONSULT NOTE - F/u Consult  Pharmacy Consult for heparin Indication: atrial fibrillation  Allergies  Allergen Reactions  . Aspirin Shortness Of Breath, Itching and Rash     Burning sensation (Patient reports he tolerates other NSAIDS)   . Bee Venom Hives and Swelling  . Lisinopril Cough  . Tomato Rash    Patient Measurements: Weight: (!) 312 lb 4.8 oz (141.7 kg) Heparin Dosing Weight: 101 kg  Vital Signs: Temp: 97.7 F (36.5 C) (09/05 0457) Temp Source: Oral (09/05 0457) BP: 97/72 (09/05 0125) Pulse Rate: 31 (09/05 0125)  Labs:  Recent Labs  06/08/17 1829 06/09/17 0500  HGB 13.3 13.3  HCT 41.6 40.3  PLT 302 269  HEPARINUNFRC  --  0.30  CREATININE 2.27*  --     Estimated Creatinine Clearance: 58.6 mL/min (A) (by C-G formula based on SCr of 2.27 mg/dL (H)).   Medical History: Past Medical History:  Diagnosis Date  . Anxiety   . Aspirin allergy   . Childhood asthma   . Chronic systolic CHF (congestive heart failure) ([place])    a. EF 20-25% in 2012. b. EF 45-50% in 10/2011 with nonischemic nuc - presumed NICM. c. 12/2014 Echo: Sev depressed LV fxn, sev dil LV, mild LVH, mild MR, sev dil LA, mildly reduced RV fxn.  . CKD (chronic kidney disease) stage 2, GFR 60-89 ml/min   . High cholesterol   . Hypertension   . Morbid obesity ([place])   . Nephrolithiasis   . OSA on CPAP   . Presumed NICM    a. 04/2014 Myoview: EF 26%, glob HK, sev glob HK, ? prior infarct;  b. Never cathed 2/2 CKD.  [name] Renal cell carcinoma ([place])    a. s/p Rt robotic assisted partial converted to radical nephrectomy on 01/2013.  . Troponin level elevated    a. 04/2014, 12/2014: felt due to CHF.  . Type II diabetes mellitus ([place])    Assessment: 40 yo male with new onset AFib with RVR. Pharmacy consulted to dose heparin. Heparin level is therapeutic at 0.30 and CBC stable. No s/s bleeding noted.   Will empirically dose increase heparin gtt to keep in goal range.   Goal of Therapy:   Heparin level 0.3-0.7 units/ml Monitor platelets by anticoagulation protocol: Yes   Plan:  Increase heparin gtt to 1500 units/hr Daily heparin level and CBC Monitor for s/s bleeding   Thank you Anette Guarneri, PharmD [phone]

## 2017-06-09 NOTE — Progress Notes (Signed)
CRITICAL VALUE ALERT  Critical Value:  0.04  Date & Time Notied:  06/09/2017 @1720   Provider Notified: Aimee NP  Orders Received/Actions taken: None

## 2017-06-09 NOTE — Progress Notes (Signed)
Called 2H twice to give report, unable to give report, awaiting call back, will continue to monitor patient

## 2017-06-09 NOTE — Progress Notes (Signed)
Advanced Heart Failure Rounding Note  Primary Cardiologist: Dr. [name]   Subjective:    Admitted 06/08/17 with new onset Afib RVR with rates up into 180s. Started on Amio gtt.   Very anxious. Has felt bad since Sunday. More SOB and worse with any activity.   Objective:   Weight Range: (!) 312 lb 4.8 oz (141.7 kg) Body mass index is 47.49 kg/m.   Vital Signs:   Temp:  [97.7 F (36.5 C)-98.5 F (36.9 C)] 97.7 F (36.5 C) (09/05 0457) Pulse Rate:  [31-148] 103 (09/05 0656) Resp:  [16-29] 16 (09/05 0656) BP: (93-127)/(62-106) 110/76 (09/05 0656) SpO2:  [90 %-100 %] 97 % (09/05 0656) Weight:  [312 lb 4.8 oz (141.7 kg)] 312 lb 4.8 oz (141.7 kg) (09/05 0125) Last BM Date: 06/09/17  Weight change: Filed Weights   06/09/17 0125  Weight: (!) 312 lb 4.8 oz (141.7 kg)    Intake/Output:   Intake/Output Summary (Last 24 hours) at 06/09/17 0822 Last data filed at 06/09/17 0146  Gross per 24 hour  Intake              320 ml  Output                0 ml  Net              32 0 ml      Physical Exam    General:  Anxious. No resp difficulty.  HEENT: Normal Neck: Supple. JVP difficult due to body habitus. Carotids 2+ bilat; no bruits. No lymphadenopathy or thyromegaly appreciated. Cor: PMI nondisplaced. Irregularly irregular, rapid.  Lungs: Clear Abdomen: Soft, nontender, nondistended. No hepatosplenomegaly. No bruits or masses. Good bowel sounds. Extremities: No cyanosis, clubbing, rash, edema Neuro: Alert & orientedx3, cranial nerves grossly intact. moves all 4 extremities w/o difficulty. Affect anxious.   Telemetry   Afib 150s currently, Personally reviewed  EKG    06/08/17 Afib 160s, Personally reviewed.   Labs    CBC  Recent Labs  06/08/17 1829 06/09/17 0500  WBC 11.0* 11.4*  NEUTROABS  --  7.0  HGB 13.3 13.3  HCT 41.6 40.3  MCV 77.3* 76.2*  PLT 302 308   Basic Metabolic Panel  Recent Labs  06/08/17 1829 06/09/17 0500  NA 137 137  K 3.6 3.7    CL 108 107  CO2 20* 21*  GLUCOSE 126* 152*  BUN 36* 36*  CREATININE 2.27* 2.35*  CALCIUM 8.6* 8.4*  MG  --  1.6*   Liver Function Tests  Recent Labs  06/09/17 0500  AST 45*  ALT 58  ALKPHOS 48  BILITOT 0.8  PROT 6.6  ALBUMIN 3.3*   No results for input(s): LIPASE, AMYLASE in the last 72 hours. Cardiac Enzymes No results for input(s): CKTOTAL, CKMB, CKMBINDEX, TROPONINI in the last 72 hours.  BNP: BNP (last 3 results)  Recent Labs  10/06/16 1242  BNP 549.6*    ProBNP (last 3 results) No results for input(s): PROBNP in the last 8760 hours.   D-Dimer No results for input(s): DDIMER in the last 72 hours. Hemoglobin A1C No results for input(s): HGBA1C in the last 72 hours. Fasting Lipid Panel No results for input(s): CHOL, HDL, LDLCALC, TRIG, CHOLHDL, LDLDIRECT in the last 72 hours. Thyroid Function Tests  Recent Labs  06/09/17 0500  TSH 4.276    Other results:   Imaging    Dg Chest 2 View  Result Date: 06/08/2017 CLINICAL DATA:  Chest tightness with shortness of  breath EXAM: CHEST  2 VIEW COMPARISON:  October 06, 2016 FINDINGS: Lungs are clear. Heart is borderline prominent with pulmonary vascularity within normal limits. No adenopathy. No pneumothorax. No bone lesions. A pacemaker device is present on the left with the lead tip near the sternomanubrial junction anteriorly. IMPRESSION: No edema or consolidation.  Stable cardiac silhouette. Electronically Signed   By: Lowella Grip III M.D.   On: 06/08/2017 18:24      Medications:     Scheduled Medications: . allopurinol  100 mg Oral Daily  . atorvastatin  80 mg Oral q1800  . colchicine  0.6 mg Oral BID  . insulin aspart  0-15 Units Subcutaneous TID WC  . insulin aspart  0-5 Units Subcutaneous QHS  . magnesium oxide  200 mg Oral Daily  . torsemide  20 mg Oral Daily     Infusions: . amiodarone 30 mg/hr (06/09/17 0215)  . amiodarone 30 mg/hr (06/09/17 0511)  . heparin 1,450 Units/hr  (06/09/17 3716)     PRN Medications:  acetaminophen, ALPRAZolam, ondansetron (ZOFRAN) IV, traMADol, zolpidem    Patient Profile   Jeffrey [name] is a 41 year old male with a past medical history of HTN, R renal cell carcinoma s/p nephrectomy, DM, OSA non compliant with CPAP, gout, morbid obesity, and chronic systolic CHF. EF 20-25% in Aug. 2018.   Admitted 06/08/17 with new onset Afib with RVR.   Assessment/Plan   1. New onset atrial fibrillation with RVR:  - Duration > 24 hours on presentation (Has felt bad since Sunday 06/06/17) - Continue amio gtt. Rebolus 150 mg now.  - Heparin gtt per pharmacy - CHA2DS2/VASc is at least 3.  - Will need to consider TEE/DCCV if Amio does not convert him. His LA is severely dilated. Will need to load him with Amio to increase his chances of holding NSR.  - Due to excessive Endo Load, first available case is 0900 on 06/11/17. Have saved this spot and asked for updates.   2. Chronic systolic CHF:   - Echo 9/67/89 EF 20-25%. [facility] SICD.  - Normal coronaries on cath 05/2015 - HF meds held on admission with hyptension.  - Volume stable on exam. Extremities warm.  -  PTA was on Coreg 25 mg BID, Hydral 75 mg TID, Imdur 30 mg daily, Entresto 49/51, Spiro 25 mg daily, and Torsemide 20 mg daily. Will need to resume as tolerated - Will discuss resuming low dose coreg with MD. Pressures too soft for other meds at this time.    3. OSA - Encouraged nightly CPAP use.    4. Obesity - Encouraged weight loss with portion control and increased activitiy  5. AKI on CKD III - Follow closely  Length of Stay: 1  Shirley Friar, PA-C  06/09/2017, 8:22 AM  Advanced Heart Failure Team Pager [phone] (M-F; 7a - 4p)  Please contact [facility] for night-coverage after hours (4p -7a ) and weekends on [email or web address]   Agree with above.   I have been at his bedside several times throughout the day due to persistent AF with RVR in setting of EF 20%. SBP now in  90s.   On exam he is anxious and mildly tachypneic Cor IRR +tachy Lungs decreased at bases Ab obese. NT. Ext: warm   He has remain in rapid AF all day despite IV amio with rates in the 130-140 range. He is developing signs of low output HF with worsening dyspnea and ab fullness. Have moved to ICU.  Will rebolus with IV amio and continue heparin. Plan DC-CV in am if not sooner - would like to load as much amio as possible prior to DC-CV to help maintain NSR. . May need inotrope support but trying to avoid so as not to worsen rate control. Will give IV digoxin. No b-blocker at this point.    CRITICAL CARE Performed by: Jeffrey [name]  Total critical care time: 45 minutes  Critical care time was exclusive of separately billable procedures and treating other patients.  Critical care was necessary to treat or prevent imminent or life-threatening deterioration.  Critical care was time spent personally by me (independent of midlevel providers or residents) on the following activities: development of treatment plan with patient and/or surrogate as well as nursing, discussions with consultants, evaluation of patient's response to treatment, examination of patient, obtaining history from patient or surrogate, ordering and performing treatments and interventions, ordering and review of laboratory studies, ordering and review of radiographic studies, pulse oximetry and re-evaluation of patient's condition.  Jeffrey Bickers, MD  8:54 PM

## 2017-06-09 NOTE — Progress Notes (Signed)
eLink Physician-Brief Progress Note Patient Name: Jeffrey [name] DOB: [birth year] MRN: [id number]   Date of Service  06/09/2017  HPI/Events of Note  41 yo male admitted with AFIB. Now on Amiodarone IV infusion. VSS. Continue magement per Cardiology.   eICU Interventions  No new orders.      Intervention Category Evaluation Type: New Patient Evaluation  Lysle Dingwall 06/09/2017, 6:52 PM

## 2017-06-09 NOTE — ED Notes (Signed)
Offered pt and family food multiple times. Pt states that he doesn't want a sandwich but would like his wife to have something.  Offered her a sandwich, but pt refused. States that wife doesn't eat meat. Offered peanut butter and crackers, but pt and wife refused.

## 2017-06-09 NOTE — Progress Notes (Signed)
Patient's heart rate in the 130's and 140's and sometimes 150's. Non sustaining. Patient is not symptomatic. On call MD notified of the heart rate. No new medication order received. MD called, asked to continue to monitor patient.

## 2017-06-09 NOTE — Progress Notes (Signed)
Report called to [place] on [address].

## 2017-06-10 ENCOUNTER — Inpatient Hospital Stay (HOSPITAL_COMMUNITY): Payer: Self-pay

## 2017-06-10 DIAGNOSIS — I480 Paroxysmal atrial fibrillation: Secondary | ICD-10-CM

## 2017-06-10 LAB — COOXEMETRY PANEL
CARBOXYHEMOGLOBIN: 1.1 % (ref 0.5–1.5)
CARBOXYHEMOGLOBIN: 0.8 % (ref 0.5–1.5)
METHEMOGLOBIN: 0.8 % (ref 0.0–1.5)
Methemoglobin: 1 % (ref 0.0–1.5)
O2 SAT: 45.8 %
O2 SAT: 88.2 %
TOTAL HEMOGLOBIN: 14.1 g/dL (ref 12.0–16.0)
Total hemoglobin: 13.8 g/dL (ref 12.0–16.0)

## 2017-06-10 LAB — GLUCOSE, CAPILLARY
GLUCOSE-CAPILLARY: 120 mg/dL — AB (ref 65–99)
Glucose-Capillary: 128 mg/dL — ABNORMAL HIGH (ref 65–99)
Glucose-Capillary: 175 mg/dL — ABNORMAL HIGH (ref 65–99)
Glucose-Capillary: 118 mg/dL — ABNORMAL HIGH (ref 65–99)

## 2017-06-10 LAB — COMPREHENSIVE METABOLIC PANEL
ALBUMIN: 3.2 g/dL — AB (ref 3.5–5.0)
ALT: 83 U/L — ABNORMAL HIGH (ref 17–63)
ANION GAP: 7 (ref 5–15)
AST: 54 U/L — ABNORMAL HIGH (ref 15–41)
Alkaline Phosphatase: 46 U/L (ref 38–126)
BILIRUBIN TOTAL: 0.8 mg/dL (ref 0.3–1.2)
BUN: 30 mg/dL — ABNORMAL HIGH (ref 6–20)
CALCIUM: 8.4 mg/dL — AB (ref 8.9–10.3)
CO2: 20 mmol/L — ABNORMAL LOW (ref 22–32)
Chloride: 110 mmol/L (ref 101–111)
Creatinine, Ser: 2.01 mg/dL — ABNORMAL HIGH (ref 0.61–1.24)
GFR calc non Af Amer: 39 mL/min — ABNORMAL LOW (ref >60)
GFR, EST AFRICAN AMERICAN: 45 mL/min — AB (ref >60)
GLUCOSE: 130 mg/dL — AB (ref 65–99)
POTASSIUM: 3.6 mmol/L (ref 3.5–5.1)
SODIUM: 137 mmol/L (ref 135–145)
TOTAL PROTEIN: 6.6 g/dL (ref 6.5–8.1)

## 2017-06-10 LAB — CBC
HEMATOCRIT: 40.9 % (ref 39.0–52.0)
Hemoglobin: 13.6 g/dL (ref 13.0–17.0)
MCH: 25.3 pg — AB (ref 26.0–34.0)
MCHC: 33.3 g/dL (ref 30.0–36.0)
MCV: 76.2 fL — ABNORMAL LOW (ref 78.0–100.0)
Platelets: 240 10*3/uL (ref 150–400)
RBC: 5.37 MIL/uL (ref 4.22–5.81)
RDW: 17.3 % — AB (ref 11.5–15.5)
WBC: 12.6 10*3/uL — ABNORMAL HIGH (ref 4.0–10.5)

## 2017-06-10 LAB — HEPARIN LEVEL (UNFRACTIONATED): HEPARIN UNFRACTIONATED: 0.37 [IU]/mL (ref 0.30–0.70)

## 2017-06-10 LAB — MAGNESIUM: Magnesium: 2 mg/dL (ref 1.7–2.4)

## 2017-06-10 MED ORDER — SODIUM CHLORIDE 0.9% FLUSH
10.0000 mL | Freq: Two times a day (BID) | INTRAVENOUS | Status: DC
  Administered 2017-06-10 – 2017-06-13 (×4): 10 mL

## 2017-06-10 MED ORDER — SODIUM CHLORIDE 0.9 % IV SOLN
250.0000 mL | INTRAVENOUS | Status: DC
Start: 2017-06-10 — End: 2017-06-13

## 2017-06-10 MED ORDER — SODIUM CHLORIDE 0.9 % IV SOLN: INTRAVENOUS | Status: DC

## 2017-06-10 MED ORDER — CHLORHEXIDINE GLUCONATE CLOTH 2 % EX PADS
6.0000 | MEDICATED_PAD | Freq: Every day | CUTANEOUS | Status: DC
  Administered 2017-06-10 – 2017-06-13 (×4): 6 via TOPICAL

## 2017-06-10 MED ORDER — SODIUM CHLORIDE 0.9% FLUSH
10.0000 mL | INTRAVENOUS | Status: DC | PRN
  Administered 2017-06-13: 40 mL
  Filled 2017-06-10: qty 40

## 2017-06-10 MED ORDER — POTASSIUM CHLORIDE CRYS ER 20 MEQ PO TBCR
40.0000 meq | EXTENDED_RELEASE_TABLET | Freq: Two times a day (BID) | ORAL | Status: DC
  Administered 2017-06-10 – 2017-06-13 (×7): 40 meq via ORAL
  Filled 2017-06-10 (×8): qty 2

## 2017-06-10 MED ORDER — SODIUM CHLORIDE 0.9% FLUSH
3.0000 mL | Freq: Two times a day (BID) | INTRAVENOUS | Status: DC
  Administered 2017-06-11 – 2017-06-12 (×4): 3 mL via INTRAVENOUS

## 2017-06-10 MED ORDER — SODIUM CHLORIDE 0.9% FLUSH
3.0000 mL | INTRAVENOUS | Status: DC | PRN
  Administered 2017-06-11 (×2): 3 mL via INTRAVENOUS
  Filled 2017-06-10 (×2): qty 3

## 2017-06-10 MED ORDER — FUROSEMIDE 10 MG/ML IJ SOLN
80.0000 mg | Freq: Two times a day (BID) | INTRAMUSCULAR | Status: DC
  Administered 2017-06-10 – 2017-06-12 (×5): 80 mg via INTRAVENOUS
  Filled 2017-06-10 (×5): qty 8

## 2017-06-10 NOTE — Care Management Note (Addendum)
Case Management Note  Patient Details  Name: Jeffrey [name] MRN: [id number] Date of Birth: January 26, 1976  Subjective/Objective:   from home with mother n law, pta indep with a past medical history of HTN, R renal cell carcinoma s/p nephrectomy, DM, OSA, gout, morbid obesity, and chronic systolic CHF, presents with new onset afib with rvr. No insurance listed, has been going to [facility].  Tried to get a follow up apt there but was unable to , will try again tomorrow.  9/7 [place], BSN- patient will be on eliquis 5mg  bid, NCM tried to make follow up apt at [facility]  Since yesterday but could not get anyone to pickup after on hold for 20 minutes,  Also tried to call this am, still unable to get someone on the line.  NCM  Made apt at the [facility] for  9/25 for 9:15 am.  NCM gave patient the brochure for the apt follow up, the patient ast form for eliquis and the 30 day free coupon for eliquis.  He states he will be dc on Sunday or Monday.  NCM will get the script from MD and get eliquis from the outpatient pharmacy. NCM gave eliquis meds to [facility] for patient.                    Action/Plan: NCM will follow for dc needs.   Expected Discharge Date:                  Expected Discharge Plan:     In-House Referral:     Discharge planning Services  CM Consult  Post Acute Care Choice:    Choice offered to:     DME Arranged:    DME Agency:     HH Arranged:    HH Agency:     Status of Service:  In process, will continue to follow  If discussed at Long Length of Stay Meetings, dates discussed:    Additional Comments:  Zenon Mayo, RN 06/10/2017, 4:54 PM

## 2017-06-10 NOTE — Progress Notes (Signed)
Peripherally Inserted Central Catheter/Midline Placement  The IV Nurse has discussed with the patient and/or persons authorized to consent for the patient, the purpose of this procedure and the potential benefits and risks involved with this procedure.  The benefits include less needle sticks, lab draws from the catheter, and the patient may be discharged home with the catheter. Risks include, but not limited to, infection, bleeding, blood clot (thrombus formation), and puncture of an artery; nerve damage and irregular heartbeat and possibility to perform a PICC exchange if needed/ordered by physician.  Alternatives to this procedure were also discussed.  B[employer] PICC patient education guide, fact sheet on infection prevention and patient information card has been provided to patient /or left at bedside.    PICC/Midline Placement Documentation        Jeffrey [name] 06/10/2017, 2:23 PM

## 2017-06-10 NOTE — Progress Notes (Signed)
ANTICOAGULATION CONSULT NOTE - Follow Up Consult  Pharmacy Consult for Heparin Indication: atrial fibrillation  Allergies  Allergen Reactions  . Aspirin Shortness Of Breath, Itching and Rash     Burning sensation (Patient reports he tolerates other NSAIDS)   . Bee Venom Hives and Swelling  . Lisinopril Cough  . Tomato Rash    Patient Measurements: Weight: (!) 309 lb 15.5 oz (140.6 kg)  Vital Signs: Temp: 97.8 F (36.6 C) (09/06 0739) Temp Source: Oral (09/06 0739) BP: 103/90 (09/06 0930) Pulse Rate: 127 (09/06 0930)  Labs:  Recent Labs  06/08/17 1829 06/09/17 0500 06/09/17 1538 06/09/17 2109 06/10/17 0416  HGB 13.3 13.3  --   --  13.6  HCT 41.6 40.3  --   --  40.9  PLT 302 269  --   --  240  HEPARINUNFRC  --  0.30  --   --  0.37  CREATININE 2.27* 2.35*  --  2.11* 2.01*  TROPONINI  --   --  0.04*  --   --     Estimated Creatinine Clearance: 65.9 mL/min (A) (by C-G formula based on SCr of 2.01 mg/dL (H)).   Medications:  Heparin @ 1500 units/hr  Assessment: 41yom continues on heparin for new onset afib with plan for TEE/DCCV either today or tomorrow. Heparin level is therapeutic at 0.37. CBC stable. No bleeding.  Goal of Therapy:  Heparin level 0.3-0.7 units/ml Monitor platelets by anticoagulation protocol: Yes   Plan:  1) Continue heparin at 1500 units/hr 2) Daily heparin level, CBC  [name] 06/10/2017,10:24 AM

## 2017-06-10 NOTE — Progress Notes (Signed)
Advanced Heart Failure Rounding Note  PCP:  Primary Cardiologist: Dr Vaughan Browner  Subjective:    Yesterday he decompensated with A Fib RVR and was hypotensive and SOB. Amio increased to 60 mg per hour and diuresed with IV lasix. Weight down 3 pounds.   AF rate still fast but improved now 110-120. Off Bipap this am. More comfortable. Creatinine slightly improved. Bicarb ok.    Objective:   Weight Range: (!) 309 lb 15.5 oz (140.6 kg) Body mass index is 47.13 kg/m.   Vital Signs:   Temp:  [97.8 F (36.6 C)-99.1 F (37.3 C)] 98.9 F (37.2 C) (09/06 0400) Pulse Rate:  [25-154] 154 (09/06 0630) Resp:  [19-45] 32 (09/06 0630) BP: (77-161)/(66-135) 102/78 (09/06 0630) SpO2:  [83 %-99 %] 96 % (09/06 0630) Weight:  [309 lb 15.5 oz (140.6 kg)] 309 lb 15.5 oz (140.6 kg) (09/06 0451) Last BM Date: 06/09/17  Weight change: Filed Weights   06/09/17 0125 06/10/17 0451  Weight: (!) 312 lb 4.8 oz (141.7 kg) (!) 309 lb 15.5 oz (140.6 kg)    Intake/Output:   Intake/Output Summary (Last 24 hours) at 06/10/17 0729 Last data filed at 06/10/17 0600  Gross per 24 hour  Intake          1909.83 ml  Output             2860 ml  Net          -950.17 ml      Physical Exam    General:  No resp difficulty. In bed.  HEENT: Normal Neck: Supple. JVP to jaw . Carotids 2+ bilat; no bruits. No lymphadenopathy or thyromegaly appreciated. Cor: PMI nondisplaced. Irr tachy  No rubs, gallops or murmurs. Lungs: Clear on 2 liters oxygen.  Abdomen: obese Soft, nontender, nondistended. No hepatosplenomegaly. No bruits or masses. Good bowel sounds. Extremities: No cyanosis, clubbing, rash, edema Neuro: Alert & orientedx3, cranial nerves grossly intact. moves all 4 extremities w/o difficulty. Affect pleasant   Telemetry   A fib RVR 110-130s Personally reviewed   EKG    A fib RVR   Labs    CBC  Recent Labs  06/09/17 0500 06/10/17 0416  WBC 11.4* 12.6*  NEUTROABS 7.0  --   HGB 13.3 13.6   HCT 40.3 40.9  MCV 76.2* 76.2*  PLT 269 102   Basic Metabolic Panel  Recent Labs  06/09/17 2109 06/10/17 0416  NA 137 137  K 3.9 3.6  CL 109 110  CO2 18* 20*  GLUCOSE 134* 130*  BUN 34* 30*  CREATININE 2.11* 2.01*  CALCIUM 8.6* 8.4*  MG 2.0 2.0   Liver Function Tests  Recent Labs  06/09/17 0500 06/10/17 0416  AST 45* 54*  ALT 58 83*  ALKPHOS 48 46  BILITOT 0.8 0.8  PROT 6.6 6.6  ALBUMIN 3.3* 3.2*   No results for input(s): LIPASE, AMYLASE in the last 72 hours. Cardiac Enzymes  Recent Labs  06/09/17 1538  TROPONINI 0.04*    BNP: BNP (last 3 results)  Recent Labs  10/06/16 1242 06/09/17 1538  BNP 549.6* 1,110.6*    ProBNP (last 3 results) No results for input(s): PROBNP in the last 8760 hours.   D-Dimer No results for input(s): DDIMER in the last 72 hours. Hemoglobin A1C No results for input(s): HGBA1C in the last 72 hours. Fasting Lipid Panel No results for input(s): CHOL, HDL, LDLCALC, TRIG, CHOLHDL, LDLDIRECT in the last 72 hours. Thyroid Function Tests  Recent Labs  06/09/17 0500  TSH 4.276    Other results:   Imaging    Dg Chest Port 1 View  Result Date: 06/09/2017 CLINICAL DATA:  Dyspnea EXAM: PORTABLE CHEST 1 VIEW COMPARISON:  Chest radiograph from one day prior. FINDINGS: Stable configuration of single lead epicardial left chest ICD. Stable cardiomediastinal silhouette with mild cardiomegaly. No pneumothorax. No pleural effusion. Mild pulmonary edema, new. IMPRESSION: New mild congestive heart failure. Electronically Signed   By: Ilona Sorrel M.D.   On: 06/09/2017 16:14      Medications:     Scheduled Medications: . allopurinol  100 mg Oral Daily  . atorvastatin  80 mg Oral q1800  . colchicine  0.6 mg Oral BID  . insulin aspart  0-15 Units Subcutaneous TID WC  . insulin aspart  0-5 Units Subcutaneous QHS  . magnesium oxide  200 mg Oral Daily     Infusions: . amiodarone 60 mg/hr (06/10/17 0600)  . heparin 1,500  Units/hr (06/10/17 0600)     PRN Medications:  acetaminophen, ALPRAZolam, ALPRAZolam, ondansetron (ZOFRAN) IV, traMADol    Patient Profile   Mr. Mozer is a 41 year old male with a past medical history of HTN, R renal cell carcinoma s/p nephrectomy, DM, OSA non compliant with CPAP, gout, morbid obesity, and chronic systolic CHF. EF 20-25% in Aug. 2018.   Admitted 06/08/17 with new onset Afib with RVR.  Assessment/Plan   1. New onset atrial fibrillation with RVR:  - Duration > 24 hours on presentation (Has felt bad since Sunday 06/06/17) - Decompensates with A fib RVR with volume overload. He has not converted.  - Continue amio 60 mg daily.   - Heparin gtt per pharmacy - CHA2DS2/VASc is at least 3.  - Possible DC-CV this morning.  - Due to excessive Endo Load, first available case is 0900 on 06/11/17. Have saved this spot and asked for updates.  2. Acute/Chronic systolic CHF:   - Echo 0/81/44 EF 20-25%. [facility] SICD.  - Normal coronaries on cath 05/2015 -Decompensated yesterday with hypotension and volume overload.  -volume status elevated. Start 80 mg IV twice daily - Start 40 meq potassium twice daily.  -  Holding HF meds for now. PTA was on Coreg 25 mg BID, Hydral 75 mg TID, Imdur 30 mg daily, Entresto 49/51, Spiro 25 mg daily, and Torsemide 20 mg daily. Will need to resume as tolerated 3. OSA - CPAP nightly and as needed.  4. Obesity - Encouraged weight loss with portion control and increased activitiy 5. AKI on CKD III - Creatinine peaked 2.3. Today creatinine down to 2.0 .  6. DMII- on sliding scale.  7. Acute respiratory failure - Required bipap last night in setting of acute pulmonary edema.  - Improved this am but still tenuous.   Length of Stay: 2  Darrick Grinder, NP  06/10/2017, 7:29 AM   Advanced Heart Failure Team Pager [phone] (M-F; 7a - 4p)  Please contact [facility] for night-coverage after hours (4p -7a ) and weekends on [email or web address]   Patient seen and  examined with Darrick Grinder, NP. We discussed all aspects of the encounter. I agree with the assessment and plan as stated above.   Very tenuous overnight on bipap in setting of rapid AF and respiratory failure due to pulmonary edema with concern for low output. This am rate has slowed a bit but still elevated. Breathing improved. Diuresing fairly well. Creatinine stable.   Will Place PICC today to check co-ox and CVP. Will  follow closely throughout the am. If stable will switch to apixaban this am and plan DC-CV tomorrow. If unstable, DC-CV today.   Glori Bickers, MD  8:28 AM

## 2017-06-11 ENCOUNTER — Encounter (HOSPITAL_COMMUNITY)
Admission: EM | Disposition: A | Discharge status: Home / Self Care | Payer: Self-pay | Source: Home / Self Care | Attending: Internal Medicine

## 2017-06-11 DIAGNOSIS — R570 Cardiogenic shock: Secondary | ICD-10-CM

## 2017-06-11 LAB — COOXEMETRY PANEL
CARBOXYHEMOGLOBIN: 1.2 % (ref 0.5–1.5)
Carboxyhemoglobin: 1.1 % (ref 0.5–1.5)
METHEMOGLOBIN: 0.8 % (ref 0.0–1.5)
METHEMOGLOBIN: 0.8 % (ref 0.0–1.5)
O2 Saturation: 45.8 %
O2 Saturation: 54.2 %
TOTAL HEMOGLOBIN: 13.2 g/dL (ref 12.0–16.0)
TOTAL HEMOGLOBIN: 13.8 g/dL (ref 12.0–16.0)

## 2017-06-11 LAB — BASIC METABOLIC PANEL
Anion gap: 9 (ref 5–15)
BUN: 31 mg/dL — AB (ref 6–20)
CHLORIDE: 103 mmol/L (ref 101–111)
CO2: 22 mmol/L (ref 22–32)
CREATININE: 2.2 mg/dL — AB (ref 0.61–1.24)
Calcium: 8.5 mg/dL — ABNORMAL LOW (ref 8.9–10.3)
GFR calc Af Amer: 41 mL/min — ABNORMAL LOW (ref >60)
GFR calc non Af Amer: 35 mL/min — ABNORMAL LOW (ref >60)
Glucose, Bld: 232 mg/dL — ABNORMAL HIGH (ref 65–99)
Potassium: 4 mmol/L (ref 3.5–5.1)
Sodium: 134 mmol/L — ABNORMAL LOW (ref 135–145)

## 2017-06-11 LAB — CBC
HEMATOCRIT: 39 % (ref 39.0–52.0)
HEMOGLOBIN: 12.9 g/dL — AB (ref 13.0–17.0)
MCH: 25.7 pg — AB (ref 26.0–34.0)
MCHC: 33.1 g/dL (ref 30.0–36.0)
MCV: 77.8 fL — ABNORMAL LOW (ref 78.0–100.0)
Platelets: 227 10*3/uL (ref 150–400)
RBC: 5.01 MIL/uL (ref 4.22–5.81)
RDW: 17.2 % — ABNORMAL HIGH (ref 11.5–15.5)
WBC: 13.7 10*3/uL — ABNORMAL HIGH (ref 4.0–10.5)

## 2017-06-11 LAB — HEPARIN LEVEL (UNFRACTIONATED): Heparin Unfractionated: 0.35 IU/mL (ref 0.30–0.70)

## 2017-06-11 LAB — GLUCOSE, CAPILLARY
GLUCOSE-CAPILLARY: 254 mg/dL — AB (ref 65–99)
GLUCOSE-CAPILLARY: 185 mg/dL — AB (ref 65–99)
GLUCOSE-CAPILLARY: 126 mg/dL — AB (ref 65–99)

## 2017-06-11 LAB — MAGNESIUM: Magnesium: 1.7 mg/dL (ref 1.7–2.4)

## 2017-06-11 SURGERY — ECHOCARDIOGRAM, TRANSESOPHAGEAL
Anesthesia: Monitor Anesthesia Care

## 2017-06-11 MED ORDER — HYDRALAZINE HCL 50 MG PO TABS
50.0000 mg | ORAL_TABLET | Freq: Three times a day (TID) | ORAL | Status: DC
  Administered 2017-06-11 – 2017-06-12 (×2): 50 mg via ORAL
  Filled 2017-06-11 (×2): qty 1

## 2017-06-11 MED ORDER — HYDRALAZINE HCL 25 MG PO TABS
25.0000 mg | ORAL_TABLET | Freq: Three times a day (TID) | ORAL | Status: DC
  Administered 2017-06-11 (×2): 25 mg via ORAL
  Filled 2017-06-11 (×2): qty 1

## 2017-06-11 MED ORDER — ZOLPIDEM TARTRATE 5 MG PO TABS
5.0000 mg | ORAL_TABLET | Freq: Every evening | ORAL | Status: DC | PRN
Start: 2017-06-11 — End: 2017-06-13
  Administered 2017-06-11 – 2017-06-12 (×2): 5 mg via ORAL
  Filled 2017-06-11 (×2): qty 1

## 2017-06-11 MED ORDER — HYDRALAZINE HCL 20 MG/ML IJ SOLN
10.0000 mg | INTRAMUSCULAR | Status: DC | PRN
  Administered 2017-06-11 (×2): 10 mg via INTRAVENOUS
  Filled 2017-06-11 (×2): qty 1

## 2017-06-11 MED ORDER — ISOSORBIDE MONONITRATE ER 30 MG PO TB24
30.0000 mg | ORAL_TABLET | Freq: Every day | ORAL | Status: DC
  Administered 2017-06-11 – 2017-06-13 (×3): 30 mg via ORAL
  Filled 2017-06-11 (×3): qty 1

## 2017-06-11 MED ORDER — APIXABAN 5 MG PO TABS
5.0000 mg | ORAL_TABLET | Freq: Two times a day (BID) | ORAL | Status: DC
  Administered 2017-06-11 – 2017-06-13 (×5): 5 mg via ORAL
  Filled 2017-06-11 (×5): qty 1

## 2017-06-11 MED ORDER — MAGNESIUM SULFATE 50 % IJ SOLN
3.0000 g | Freq: Once | INTRAMUSCULAR | Status: AC
  Administered 2017-06-11: 3 g via INTRAVENOUS
  Filled 2017-06-11: qty 6

## 2017-06-11 MED FILL — ELIQUIS 5 MG TABLET: 5 | 30 days supply | Qty: 60 | Fill #0

## 2017-06-11 NOTE — Progress Notes (Signed)
CARDIAC REHAB PHASE I   PRE:  Rate/Rhythm: 86 SR  BP:  Sitting: 124/76        SaO2: 95 RA  MODE:  Ambulation: 370 ft   POST:  Rate/Rhythm: 94 SR  BP:  Sitting: 144/102         SaO2: 95 RA  Pt ambulated 370 ft on RA, IV, assist x1, slow but steady gait, tolerated fairly well. Pt c/o feeling "a little lightheaded," mild DOE, denies any other complaints, declined rest stop. BP somewhat elevated upon return to room. Pt to wheelchair for transfer to 3W after walk, RN at bedside. Will follow.   [place], RN, BSN 06/11/2017 3:00 PM

## 2017-06-11 NOTE — Progress Notes (Signed)
Patients eliquis prescription for discharge placed in daily med bin of pyxis.  Will notify oncoming nurse and bring with [place] upon transfer to stepdown unit. Will continue to monitor.

## 2017-06-11 NOTE — Progress Notes (Signed)
Inpatient Diabetes Program Recommendations  AACE/ADA: New Consensus Statement on Inpatient Glycemic Control (2015)  Target Ranges:  Prepandial:   less than 140 mg/dL      Peak postprandial:   less than 180 mg/dL (1-2 hours)      Critically ill patients:  140 - 180 mg/dL   Lab Results  Component Value Date   GLUCAP 254 (H) 06/11/2017   HGBA1C 6.2 02/17/2017    Review of Glycemic Control Results for CORDON, GASSETT (MRN [id number]) as of 06/11/2017 09:47  Ref. Range 06/10/2017 07:41 06/10/2017 12:40 06/10/2017 16:25 06/10/2017 22:10 06/11/2017 08:11  Glucose-Capillary Latest Ref Range: 65 - 99 mg/dL 128 (H) 120 (H) 175 (H) 118 (H) 254 (H)   Diabetes history: DM2 Outpatient Diabetes medications: 70/30 Novolin mix 34 units bid Current orders for Inpatient glycemic control: Novolog 0-15 units tid  Inpatient Diabetes Program Recommendations:    Please consider basal insulin -Lantus 30 units daily while in the hospital or 50% of 70/30 mix ac bid  Thank you, Nani Gasser. Abdo Denault, RN, MSN, CDE  Diabetes Coordinator Inpatient Glycemic Control Team Team Pager [phone] (8am-5pm) 06/11/2017 10:03 AM

## 2017-06-11 NOTE — Progress Notes (Signed)
Advanced Heart Failure Rounding Note  PCP:  Primary Cardiologist: Dr Vaughan Browner  Subjective:    Decompensated with A Fib RVR and was hypotensive and SOB. Placed on amio and IV lasix.   Last night converted to NSR. Increased urine output. CVP 11-12. Todays CO-OX is 54%.   Feeling better. Mild dyspnea with exertion.   Objective:   Weight Range: (!) 304 lb 14.3 oz (138.3 kg) Body mass index is 46.36 kg/m.   Vital Signs:   Temp:  [97.8 F (36.6 C)-98.7 F (37.1 C)] 97.9 F (36.6 C) (09/07 0400) Pulse Rate:  [34-146] 81 (09/07 0630) Resp:  [12-52] 19 (09/07 0630) BP: (85-163)/(62-125) 142/92 (09/07 0630) SpO2:  [87 %-99 %] 95 % (09/07 0630) Weight:  [304 lb 14.3 oz (138.3 kg)] 304 lb 14.3 oz (138.3 kg) (09/07 0600) Last BM Date: 06/09/17  Weight change: Filed Weights   06/09/17 0125 06/10/17 0451 06/11/17 0600  Weight: (!) 312 lb 4.8 oz (141.7 kg) (!) 309 lb 15.5 oz (140.6 kg) (!) 304 lb 14.3 oz (138.3 kg)    Intake/Output:   Intake/Output Summary (Last 24 hours) at 06/11/17 0653 Last data filed at 06/11/17 0600  Gross per 24 hour  Intake           1416.3 ml  Output             3900 ml  Net          -2483.7 ml      Physical Exam   CVP 11-12 General:  Well appearing. No resp difficulty HEENT: normal Neck: supple.JVP ~10. Carotids 2+ bilat; no bruits. No lymphadenopathy or thryomegaly appreciated. Cor: PMI nondisplaced. Regular rate & rhythm. No rubs, gallops or murmurs. Lungs: clear  Abdomen: soft, nontender, nondistended. No hepatosplenomegaly. No bruits or masses. Good bowel sounds. Extremities: no cyanosis, clubbing, rash, edema. RUE PICC  Neuro: alert & orientedx3, cranial nerves grossly intact. moves all 4 extremities w/o difficulty. Affect pleasant    Telemetry   NSR 70-80s personally reviewed.    EKG    A fib RVR   Labs    CBC  Recent Labs  06/09/17 0500 06/10/17 0416 06/11/17 0423  WBC 11.4* 12.6* 13.7*  NEUTROABS 7.0  --   --     HGB 13.3 13.6 12.9*  HCT 40.3 40.9 39.0  MCV 76.2* 76.2* 77.8*  PLT 269 240 638   Basic Metabolic Panel  Recent Labs  06/10/17 0416 06/11/17 0423  NA 137 134*  K 3.6 4.0  CL 110 103  CO2 20* 22  GLUCOSE 130* 232*  BUN 30* 31*  CREATININE 2.01* 2.20*  CALCIUM 8.4* 8.5*  MG 2.0 1.7   Liver Function Tests  Recent Labs  06/09/17 0500 06/10/17 0416  AST 45* 54*  ALT 58 83*  ALKPHOS 48 46  BILITOT 0.8 0.8  PROT 6.6 6.6  ALBUMIN 3.3* 3.2*   No results for input(s): LIPASE, AMYLASE in the last 72 hours. Cardiac Enzymes  Recent Labs  06/09/17 1538  TROPONINI 0.04*    BNP: BNP (last 3 results)  Recent Labs  10/06/16 1242 06/09/17 1538  BNP 549.6* 1,110.6*    ProBNP (last 3 results) No results for input(s): PROBNP in the last 8760 hours.   D-Dimer No results for input(s): DDIMER in the last 72 hours. Hemoglobin A1C No results for input(s): HGBA1C in the last 72 hours. Fasting Lipid Panel No results for input(s): CHOL, HDL, LDLCALC, TRIG, CHOLHDL, LDLDIRECT in the last 72 hours.  Thyroid Function Tests  Recent Labs  06/09/17 0500  TSH 4.276    Other results:   Imaging    Dg Chest Port 1 View  Result Date: 06/10/2017 CLINICAL DATA:  PICC line placement. EXAM: PORTABLE CHEST 1 VIEW COMPARISON:  Radiograph of June 09, 2017. FINDINGS: Stable cardiomegaly with mild central pulmonary vascular congestion is noted. No pneumothorax or pleural effusion is noted. No consolidative process is noted. Bony thorax is unremarkable. Interval placement of right-sided PICC line with distal tip in expected position of SVC. Bony thorax is unremarkable. IMPRESSION: Stable cardiomegaly with mild central pulmonary vascular congestion. Interval placement of right-sided PICC line with distal tip in expected position of SVC. Electronically Signed   By: Marijo Conception, M.D.   On: 06/10/2017 14:39     Medications:     Scheduled Medications: . allopurinol  100 mg Oral  Daily  . atorvastatin  80 mg Oral q1800  . Chlorhexidine Gluconate Cloth  6 each Topical Daily  . colchicine  0.6 mg Oral BID  . furosemide  80 mg Intravenous BID  . insulin aspart  0-15 Units Subcutaneous TID WC  . insulin aspart  0-5 Units Subcutaneous QHS  . magnesium oxide  200 mg Oral Daily  . potassium chloride  40 mEq Oral BID  . sodium chloride flush  10-40 mL Intracatheter Q12H  . sodium chloride flush  3 mL Intravenous Q12H    Infusions: . sodium chloride    . sodium chloride    . amiodarone 60 mg/hr (06/11/17 0600)  . heparin 1,500 Units/hr (06/11/17 0600)    PRN Medications: acetaminophen, ALPRAZolam, ALPRAZolam, ondansetron (ZOFRAN) IV, sodium chloride flush, sodium chloride flush, traMADol    Patient Profile   Jeffrey [name] is a 41 year old male with a past medical history of HTN, R renal cell carcinoma s/p nephrectomy, DM, OSA non compliant with CPAP, gout, morbid obesity, and chronic systolic CHF. EF 20-25% in Aug. 2018.   Admitted 06/08/17 with new onset Afib with RVR.  Assessment/Plan   1. New onset atrial fibrillation with RVR:  - Duration > 24 hours on presentation (Has felt bad since Sunday 06/06/17) - Decompensates with A fib RVR --> volume overload.  -He converted to NSR last night.  - Cut back amio to 30 mg daily. Tomorrow transition to po.  - Start eliquis 5 mg twice a day.     - CHA2DS2/VASc is at least 3.  - Cancel TEE DC-CV  2. Acute/Chronic systolic CHF:   - Echo 9/56/21 EF 20-25%. [facility] SICD.  - Normal coronaries on cath 05/2015 -Todays CO-OX is 54%.  -CVP 11-12. Continue IV lasix one more day. Anticipate switching back to torsemide tomorrow.  Hold bb - Add 25 mg hydralazine three times a day + 30 mg imdu.  - Continue 40 meq potassium twice daily.  -  PTA was on Coreg 25 mg BID, Hydral 75 mg TID, Imdur 30 mg daily, Entresto 49/51, Spiro 25 mg daily, and Torsemide 20 mg daily.  3. OSA - CPAP nightly and as needed.  4. Obesity - Encouraged  weight loss with portion control and increased activitiy 5. AKI on CKD III - Creatinine peaked 2.3. Todays Creatinine 2.2 Today creatinine down to 2.0 .  6. DMII- on sliding scale.  7. Acute respiratory failure - Required bipap last night in setting of acute pulmonary edema.  - Resolved.   Consult cardiac rehab. Ambulate. Transfer to SDU.    Length of Stay: 3  Jeffrey Grinder, NP  06/11/2017, 6:53 AM   Advanced Heart Failure Team Pager [phone] (M-F; 7a - 4p)  Please contact [facility] for night-coverage after hours (4p -7a ) and weekends on [email or web address]  Agree with above. See my note as well.   Jeffrey Bickers, MD  12:29 PM

## 2017-06-11 NOTE — Progress Notes (Signed)
ANTICOAGULATION CONSULT NOTE - Follow Up Consult  Pharmacy Consult for Heparin >> Apixaban Indication: atrial fibrillation  Allergies  Allergen Reactions  . Aspirin Shortness Of Breath, Itching and Rash     Burning sensation (Patient reports he tolerates other NSAIDS)   . Bee Venom Hives and Swelling  . Lisinopril Cough  . Tomato Rash    Patient Measurements: Height: 5\' 8"  (172.7 cm) Weight: (!) 304 lb 14.3 oz (138.3 kg) IBW/kg (Calculated) : 68.4  Vital Signs: Temp: 97.9 F (36.6 C) (09/07 0400) Temp Source: Oral (09/07 0400) BP: 120/102 (09/07 0700) Pulse Rate: 78 (09/07 0700)  Labs:  Recent Labs  06/09/17 0500 06/09/17 1538 06/09/17 2109 06/10/17 0416 06/11/17 0423  HGB 13.3  --   --  13.6 12.9*  HCT 40.3  --   --  40.9 39.0  PLT 269  --   --  240 227  HEPARINUNFRC 0.30  --   --  0.37 0.35  CREATININE 2.35*  --  2.11* 2.01* 2.20*  TROPONINI  --  0.04*  --   --   --     Estimated Creatinine Clearance: 59.6 mL/min (A) (by C-G formula based on SCr of 2.2 mg/dL (H)).   Medications:  Heparin @ 1500 units/hr  Assessment: 41yom continues on heparin for new onset afib. He converted to NSR so TEE/DCCV cancelled. Heparin level is therapeutic 0.35 but plan to change to apixaban today. He only meets one requirement for dose reduction (Cr 2.2) so will use 5mg  bid. CBC stable.  Goal of Therapy:  Monitor platelets by anticoagulation protocol: Yes   Plan:  1) Stop heparin 2) Start apixaban 5mg  po bid 3) Case management consult for cost 4) Will provide pharmacist education  [name] 06/11/2017,8:02 AM

## 2017-06-11 NOTE — Progress Notes (Signed)
Advanced Heart Failure Rounding Note  PCP:  Primary Cardiologist: Dr Vaughan Browner  Subjective:    Decompensated with A Fib RVR and was hypotensive and SOB. Placed on amio and IV lasix.   Last night converted to NSR. Increased urine output. CVP 11-12. Todays CO-OX is 54%.   Feeling better. Mild dyspnea with exertion.   Objective:   Weight Range: (!) 304 lb 14.3 oz (138.3 kg) Body mass index is 46.36 kg/m.   Vital Signs:   Temp:  [97.8 F (36.6 C)-99.1 F (37.3 C)] 99.1 F (37.3 C) (09/07 1241) Pulse Rate:  [43-146] 81 (09/07 1300) Resp:  [14-52] 41 (09/07 1300) BP: (119-163)/(65-121) 124/76 (09/07 1300) SpO2:  [87 %-98 %] 88 % (09/07 1300) Weight:  [304 lb 14.3 oz (138.3 kg)] 304 lb 14.3 oz (138.3 kg) (09/07 0600) Last BM Date: 06/10/17  Weight change: Filed Weights   06/09/17 0125 06/10/17 0451 06/11/17 0600  Weight: (!) 312 lb 4.8 oz (141.7 kg) (!) 309 lb 15.5 oz (140.6 kg) (!) 304 lb 14.3 oz (138.3 kg)    Intake/Output:   Intake/Output Summary (Last 24 hours) at 06/11/17 1503 Last data filed at 06/11/17 1300  Gross per 24 hour  Intake           1627.7 ml  Output             3925 ml  Net          -2297.3 ml      Physical Exam   CVP 11-12 General:  Well appearing. No resp difficulty HEENT: normal Neck: supple.JVP ~10. Carotids 2+ bilat; no bruits. No lymphadenopathy or thryomegaly appreciated. Cor: PMI nondisplaced. Regular rate & rhythm. No rubs, gallops or murmurs. Lungs: clear  Abdomen: soft, nontender, nondistended. No hepatosplenomegaly. No bruits or masses. Good bowel sounds. Extremities: no cyanosis, clubbing, rash, edema. RUE PICC  Neuro: alert & orientedx3, cranial nerves grossly intact. moves all 4 extremities w/o difficulty. Affect pleasant    Telemetry   NSR 70-80s personally reviewed.    EKG    A fib RVR   Labs    CBC  Recent Labs  06/09/17 0500 06/10/17 0416 06/11/17 0423  WBC 11.4* 12.6* 13.7*  NEUTROABS 7.0  --   --     HGB 13.3 13.6 12.9*  HCT 40.3 40.9 39.0  MCV 76.2* 76.2* 77.8*  PLT 269 240 903   Basic Metabolic Panel  Recent Labs  06/10/17 0416 06/11/17 0423  NA 137 134*  K 3.6 4.0  CL 110 103  CO2 20* 22  GLUCOSE 130* 232*  BUN 30* 31*  CREATININE 2.01* 2.20*  CALCIUM 8.4* 8.5*  MG 2.0 1.7   Liver Function Tests  Recent Labs  06/09/17 0500 06/10/17 0416  AST 45* 54*  ALT 58 83*  ALKPHOS 48 46  BILITOT 0.8 0.8  PROT 6.6 6.6  ALBUMIN 3.3* 3.2*   No results for input(s): LIPASE, AMYLASE in the last 72 hours. Cardiac Enzymes  Recent Labs  06/09/17 1538  TROPONINI 0.04*    BNP: BNP (last 3 results)  Recent Labs  10/06/16 1242 06/09/17 1538  BNP 549.6* 1,110.6*    ProBNP (last 3 results) No results for input(s): PROBNP in the last 8760 hours.   D-Dimer No results for input(s): DDIMER in the last 72 hours. Hemoglobin A1C No results for input(s): HGBA1C in the last 72 hours. Fasting Lipid Panel No results for input(s): CHOL, HDL, LDLCALC, TRIG, CHOLHDL, LDLDIRECT in the last 72 hours.  Thyroid Function Tests  Recent Labs  06/09/17 0500  TSH 4.276    Other results:   Imaging    No results found.   Medications:     Scheduled Medications: . allopurinol  100 mg Oral Daily  . apixaban  5 mg Oral BID  . atorvastatin  80 mg Oral q1800  . Chlorhexidine Gluconate Cloth  6 each Topical Daily  . colchicine  0.6 mg Oral BID  . furosemide  80 mg Intravenous BID  . hydrALAZINE  25 mg Oral Q8H  . insulin aspart  0-15 Units Subcutaneous TID WC  . insulin aspart  0-5 Units Subcutaneous QHS  . isosorbide mononitrate  30 mg Oral Daily  . magnesium oxide  200 mg Oral Daily  . potassium chloride  40 mEq Oral BID  . sodium chloride flush  10-40 mL Intracatheter Q12H  . sodium chloride flush  3 mL Intravenous Q12H    Infusions: . sodium chloride    . sodium chloride    . amiodarone 30 mg/hr (06/11/17 0924)    PRN Medications: acetaminophen,  ALPRAZolam, ALPRAZolam, ondansetron (ZOFRAN) IV, sodium chloride flush, sodium chloride flush, traMADol    Patient Profile   Mr. Meyerhoff is a 41 year old male with a past medical history of HTN, R renal cell carcinoma s/p nephrectomy, DM, OSA non compliant with CPAP, gout, morbid obesity, and chronic systolic CHF. EF 20-25% in Aug. 2018.   Admitted 06/08/17 with new onset Afib with RVR.  Assessment/Plan   1. New onset atrial fibrillation with RVR:  - Duration > 24 hours on presentation (Has felt bad since Sunday 06/06/17) - Decompensates with A fib RVR --> volume overload.  -He converted to NSR last night.  - Cut back amio to 30 mg daily. Tomorrow transition to po.  - Start eliquis 5 mg twice a day.     - CHA2DS2/VASc is at least 3.  - Cancel TEE DC-CV  2. Acute/Chronic systolic CHF:   - Echo 6/44/03 EF 20-25%. [facility] SICD.  - Normal coronaries on cath 05/2015 -Todays CO-OX is 54%.  -CVP 11-12. Continue IV lasix one more day. Anticipate switching back to torsemide tomorrow.  Hold bb - Add 25 mg hydralazine three times a day + 30 mg imdu.  - Continue 40 meq potassium twice daily.  -  PTA was on Coreg 25 mg BID, Hydral 75 mg TID, Imdur 30 mg daily, Entresto 49/51, Spiro 25 mg daily, and Torsemide 20 mg daily.  3. OSA - CPAP nightly and as needed.  4. Obesity - Encouraged weight loss with portion control and increased activitiy 5. AKI on CKD III - Creatinine peaked 2.3. Todays Creatinine 2.2 Today creatinine down to 2.0 .  6. DMII- on sliding scale.  7. Acute respiratory failure - Required bipap last night in setting of acute pulmonary edema.  - Resolved.   Consult cardiac rehab. Ambulate. Transfer to SDU.    Length of Stay: 3  Glori Bickers, MD  06/11/2017, 3:03 PM   Advanced Heart Failure Team Pager [phone] (M-F; 7a - 4p)  Please contact [facility] for night-coverage after hours (4p -7a ) and weekends on [email or web address]  Patient seen and examined with Darrick Grinder, NP.  We discussed all aspects of the encounter. I agree with the assessment and plan as stated above.   He is back in NSR today on IV amio. Co-ox borderline. CVP up. Will continue IV amio. Resume IV diuresis. Renal function improving. Continue to follow co-ox  closely as he is tenuous.   Glori Bickers, MD  3:04 PM

## 2017-06-12 LAB — CBC
HCT: 39.4 % (ref 39.0–52.0)
Hemoglobin: 12.8 g/dL — ABNORMAL LOW (ref 13.0–17.0)
MCH: 25.1 pg — ABNORMAL LOW (ref 26.0–34.0)
MCHC: 32.5 g/dL (ref 30.0–36.0)
MCV: 77.3 fL — ABNORMAL LOW (ref 78.0–100.0)
Platelets: 236 K/uL (ref 150–400)
RBC: 5.1 MIL/uL (ref 4.22–5.81)
RDW: 17.2 % — ABNORMAL HIGH (ref 11.5–15.5)
WBC: 9.9 K/uL (ref 4.0–10.5)

## 2017-06-12 LAB — GLUCOSE, CAPILLARY
GLUCOSE-CAPILLARY: 124 mg/dL — AB (ref 65–99)
GLUCOSE-CAPILLARY: 128 mg/dL — AB (ref 65–99)
Glucose-Capillary: 169 mg/dL — ABNORMAL HIGH (ref 65–99)
Glucose-Capillary: 132 mg/dL — ABNORMAL HIGH (ref 65–99)

## 2017-06-12 LAB — BASIC METABOLIC PANEL
Anion gap: 7 (ref 5–15)
BUN: 30 mg/dL — AB (ref 6–20)
CALCIUM: 9.2 mg/dL (ref 8.9–10.3)
CO2: 24 mmol/L (ref 22–32)
Chloride: 105 mmol/L (ref 101–111)
Creatinine, Ser: 2.05 mg/dL — ABNORMAL HIGH (ref 0.61–1.24)
GFR calc Af Amer: 44 mL/min — ABNORMAL LOW (ref >60)
GFR, EST NON AFRICAN AMERICAN: 38 mL/min — AB (ref >60)
GLUCOSE: 129 mg/dL — AB (ref 65–99)
POTASSIUM: 4 mmol/L (ref 3.5–5.1)
Sodium: 136 mmol/L (ref 135–145)

## 2017-06-12 LAB — COOXEMETRY PANEL
Carboxyhemoglobin: 1.7 % — ABNORMAL HIGH (ref 0.5–1.5)
Methemoglobin: 0.6 % (ref 0.0–1.5)
O2 Saturation: 65 %
Total hemoglobin: 13.2 g/dL (ref 12.0–16.0)

## 2017-06-12 LAB — MAGNESIUM: Magnesium: 2.1 mg/dL (ref 1.7–2.4)

## 2017-06-12 MED ORDER — TORSEMIDE 20 MG PO TABS
20.0000 mg | ORAL_TABLET | Freq: Two times a day (BID) | ORAL | Status: DC
  Administered 2017-06-12 – 2017-06-13 (×2): 20 mg via ORAL
  Filled 2017-06-12 (×2): qty 1

## 2017-06-12 MED ORDER — SACUBITRIL-VALSARTAN 24-26 MG PO TABS
1.0000 | ORAL_TABLET | Freq: Two times a day (BID) | ORAL | Status: DC
  Administered 2017-06-12 – 2017-06-13 (×3): 1 via ORAL
  Filled 2017-06-12 (×3): qty 1

## 2017-06-12 MED ORDER — HYDRALAZINE HCL 50 MG PO TABS
75.0000 mg | ORAL_TABLET | Freq: Three times a day (TID) | ORAL | Status: DC
  Administered 2017-06-12 – 2017-06-13 (×4): 75 mg via ORAL
  Filled 2017-06-12 (×4): qty 1

## 2017-06-12 MED ORDER — AMIODARONE HCL 200 MG PO TABS
400.0000 mg | ORAL_TABLET | Freq: Two times a day (BID) | ORAL | Status: DC
  Administered 2017-06-12 – 2017-06-13 (×3): 400 mg via ORAL
  Filled 2017-06-12 (×3): qty 2

## 2017-06-12 NOTE — Progress Notes (Signed)
Advanced Heart Failure Rounding Note  PCP:  Primary Cardiologist: Dr Vaughan Browner  Subjective:    Remains in NSR. BP climbing. Weight up 2 pounds despite IV lasix. CVP 8-9  SBP 150-160  Creatinine 2.2-> 2.05  Denies SOB.   Objective:   Weight Range: (!) 139 kg (306 lb 8 oz) Body mass index is 46.6 kg/m.   Vital Signs:   Temp:  [97.9 F (36.6 C)-99.4 F (37.4 C)] 97.9 F (36.6 C) (09/08 0815) Pulse Rate:  [76-91] 76 (09/08 0815) Resp:  [16-42] 19 (09/08 0815) BP: (124-162)/(76-119) 125/78 (09/08 0815) SpO2:  [88 %-97 %] 95 % (09/08 0815) Weight:  [139 kg (306 lb 8 oz)] 139 kg (306 lb 8 oz) (09/08 0510) Last BM Date: 06/10/17  Weight change: Filed Weights   06/10/17 0451 06/11/17 0600 06/12/17 0510  Weight: (!) 140.6 kg (309 lb 15.5 oz) (!) 138.3 kg (304 lb 14.3 oz) (!) 139 kg (306 lb 8 oz)    Intake/Output:   Intake/Output Summary (Last 24 hours) at 06/12/17 1132 Last data filed at 06/12/17 1058  Gross per 24 hour  Intake          1144.32 ml  Output             1725 ml  Net          -580.68 ml      Physical Exam   CVP 8-9 General:  Well appearing. No resp difficulty HEENT: normal Neck: supple.J CVP 9 Carotids 2+ bilat; no bruits. No lymphadenopathy or thryomegaly appreciated. Cor: PMI nondisplaced. Regular rate & rhythm. No rubs, gallops or murmurs. Lungs: clear  Abdomen: soft, nontender, nondistended. No hepatosplenomegaly. No bruits or masses. Good bowel sounds. Extremities: no cyanosis, clubbing, rash, edema. RUE PICC  Neuro: alert & orientedx3, cranial nerves grossly intact. moves all 4 extremities w/o difficulty. Affect pleasant    Telemetry   NSR 70s personally reviewed.    EKG    A fib RVR   Labs    CBC  Recent Labs  06/11/17 0423 06/12/17 0445  WBC 13.7* 9.9  HGB 12.9* 12.8*  HCT 39.0 39.4  MCV 77.8* 77.3*  PLT 227 431   Basic Metabolic Panel  Recent Labs  06/11/17 0423 06/12/17 0445  NA 134* 136  K 4.0 4.0  CL 103  105  CO2 22 24  GLUCOSE 232* 129*  BUN 31* 30*  CREATININE 2.20* 2.05*  CALCIUM 8.5* 9.2  MG 1.7 2.1   Liver Function Tests  Recent Labs  06/10/17 0416  AST 54*  ALT 83*  ALKPHOS 46  BILITOT 0.8  PROT 6.6  ALBUMIN 3.2*   No results for input(s): LIPASE, AMYLASE in the last 72 hours. Cardiac Enzymes  Recent Labs  06/09/17 1538  TROPONINI 0.04*    BNP: BNP (last 3 results)  Recent Labs  10/06/16 1242 06/09/17 1538  BNP 549.6* 1,110.6*    ProBNP (last 3 results) No results for input(s): PROBNP in the last 8760 hours.   D-Dimer No results for input(s): DDIMER in the last 72 hours. Hemoglobin A1C No results for input(s): HGBA1C in the last 72 hours. Fasting Lipid Panel No results for input(s): CHOL, HDL, LDLCALC, TRIG, CHOLHDL, LDLDIRECT in the last 72 hours. Thyroid Function Tests No results for input(s): TSH, T4TOTAL, T3FREE, THYROIDAB in the last 72 hours.  Invalid input(s): FREET3  Other results:   Imaging    No results found.   Medications:     Scheduled Medications: .  allopurinol  100 mg Oral Daily  . apixaban  5 mg Oral BID  . atorvastatin  80 mg Oral q1800  . Chlorhexidine Gluconate Cloth  6 each Topical Daily  . colchicine  0.6 mg Oral BID  . furosemide  80 mg Intravenous BID  . hydrALAZINE  50 mg Oral Q8H  . insulin aspart  0-15 Units Subcutaneous TID WC  . insulin aspart  0-5 Units Subcutaneous QHS  . isosorbide mononitrate  30 mg Oral Daily  . magnesium oxide  200 mg Oral Daily  . potassium chloride  40 mEq Oral BID  . sodium chloride flush  10-40 mL Intracatheter Q12H  . sodium chloride flush  3 mL Intravenous Q12H    Infusions: . sodium chloride    . amiodarone 30 mg/hr (06/12/17 0922)    PRN Medications: acetaminophen, ALPRAZolam, ALPRAZolam, hydrALAZINE, ondansetron (ZOFRAN) IV, sodium chloride flush, sodium chloride flush, traMADol, zolpidem    Patient Profile   Mr. Sutch is a 41 year old male with a past  medical history of HTN, R renal cell carcinoma s/p nephrectomy, DM, OSA non compliant with CPAP, gout, morbid obesity, and chronic systolic CHF. EF 20-25% in Aug. 2018.   Admitted 06/08/17 with new onset Afib with RVR.  Assessment/Plan   1. New onset atrial fibrillation with RVR:  - Back in NSR. Can switch to po amio.  - Continue eliquis 5 mg twice a day.     - CHA2DS2/VASc is at least 3.  2. Acute/Chronic systolic CHF:   - Echo 0/48/88 EF 20-25%. [facility] SICD.  - Normal coronaries on cath 05/2015 -Todays CO-OX is 54%-> 65% -CVP 8-9 . Will switch back to home torsemide - Titrate Hydral/imdur. Resume entresto  3. OSA - CPAP nightly and as needed.  4. Obesity - Encouraged weight loss with portion control and increased activitiy 5. AKI on CKD III - Creatinine peaked 2.3.Today creatinine down to 2.0 .  6. DMII- on sliding scale.  7. Acute respiratory failure - Resolved. Continue nightime bipap for OSA   Likely home in am   Length of Stay: 4  Glori Bickers, MD  06/12/2017, 11:32 AM   Advanced Heart Failure Team Pager [phone] (M-F; 7a - 4p)  Please contact [facility] for night-coverage after hours (4p -7a ) and weekends on [email or web address]

## 2017-06-12 NOTE — Plan of Care (Signed)
Problem: Education: Goal: Knowledge of disease or condition will improve Outcome: Progressing Patient educated on new medications and importance of complying with prescribed dietary and medical regimen.  Verbalizes understanding of importance

## 2017-06-12 NOTE — Progress Notes (Signed)
CARDIAC REHAB PHASE I   PRE:  Rate/Rhythm: 82 nsr  BP:  Sitting: 125/81 right leg      SaO2: 96 ra  MODE:  Ambulation: 550 ft   POST:  Rate/Rhythm: 89 nsr  BP:  Sitting: 142/93     SaO2: 97 ra 1310-1410 Patient ambulated in hallway independently. Denied complaints. Steady gait. Post ambulation patient back to sitting in bed. HF discharge education packet given and reviewed with patient and wife. Also reviewed cardiac/HF/DM diet and activity progression. Patient has Medicaid pending. Will place referral to phase 2 cardiac rehab in [place] Medicaid approved.   Katurah Karapetian English PayneRN, BSN 06/12/2017 2:22 PM

## 2017-06-13 LAB — CBC
HCT: 41 % (ref 39.0–52.0)
HEMOGLOBIN: 13.2 g/dL (ref 13.0–17.0)
MCH: 25.1 pg — AB (ref 26.0–34.0)
MCHC: 32.2 g/dL (ref 30.0–36.0)
MCV: 77.9 fL — ABNORMAL LOW (ref 78.0–100.0)
Platelets: 267 10*3/uL (ref 150–400)
RBC: 5.26 MIL/uL (ref 4.22–5.81)
RDW: 17.7 % — ABNORMAL HIGH (ref 11.5–15.5)
WBC: 8.5 10*3/uL (ref 4.0–10.5)

## 2017-06-13 LAB — BASIC METABOLIC PANEL
ANION GAP: 6 (ref 5–15)
BUN: 30 mg/dL — ABNORMAL HIGH (ref 6–20)
CHLORIDE: 108 mmol/L (ref 101–111)
CO2: 25 mmol/L (ref 22–32)
Calcium: 9.6 mg/dL (ref 8.9–10.3)
Creatinine, Ser: 1.82 mg/dL — ABNORMAL HIGH (ref 0.61–1.24)
GFR calc Af Amer: 51 mL/min — ABNORMAL LOW (ref >60)
GFR calc non Af Amer: 44 mL/min — ABNORMAL LOW (ref >60)
Glucose, Bld: 125 mg/dL — ABNORMAL HIGH (ref 65–99)
POTASSIUM: 4.5 mmol/L (ref 3.5–5.1)
Sodium: 139 mmol/L (ref 135–145)

## 2017-06-13 LAB — GLUCOSE, CAPILLARY
GLUCOSE-CAPILLARY: 116 mg/dL — AB (ref 65–99)
GLUCOSE-CAPILLARY: 129 mg/dL — AB (ref 65–99)

## 2017-06-13 LAB — COOXEMETRY PANEL
Carboxyhemoglobin: 1.1 % (ref 0.5–1.5)
Methemoglobin: 1 % (ref 0.0–1.5)
O2 Saturation: 60.5 %
TOTAL HEMOGLOBIN: 13.7 g/dL (ref 12.0–16.0)

## 2017-06-13 MED ORDER — AMIODARONE HCL 200 MG PO TABS: ORAL_TABLET | ORAL | 3 refills | Status: DC

## 2017-06-13 MED ORDER — APIXABAN 5 MG PO TABS: 5.0000 mg | ORAL_TABLET | Freq: Two times a day (BID) | ORAL | 10 refills | Status: DC

## 2017-06-13 MED ORDER — APIXABAN 5 MG PO TABS: 5.0000 mg | ORAL_TABLET | Freq: Two times a day (BID) | ORAL | 0 refills | Status: DC

## 2017-06-13 NOTE — Progress Notes (Signed)
Advanced Heart Failure Rounding Note  PCP:  Primary Cardiologist: Dr Vaughan Browner  Subjective:    Remains in NSR. Switched to oral diuretics yesterday. Weight down 1 pound. Ambulated without SOB.   Creatinine improving. Co-ox 61%   Objective:   Weight Range: (!) 305 lb 11.2 oz (138.7 kg) Body mass index is 46.48 kg/m.   Vital Signs:   Temp:  [97.5 F (36.4 C)-98.3 F (36.8 C)] 97.6 F (36.4 C) (09/09 0850) Pulse Rate:  [67-80] 75 (09/09 0850) Resp:  [17-24] 19 (09/09 0850) BP: (114-137)/(70-98) 114/98 (09/09 0850) SpO2:  [94 %-99 %] 99 % (09/09 0850) Weight:  [305 lb 11.2 oz (138.7 kg)] 305 lb 11.2 oz (138.7 kg) (09/09 0440) Last BM Date: 06/12/17  Weight change: Filed Weights   06/11/17 0600 06/12/17 0510 06/13/17 0440  Weight: (!) 304 lb 14.3 oz (138.3 kg) (!) 306 lb 8 oz (139 kg) (!) 305 lb 11.2 oz (138.7 kg)    Intake/Output:   Intake/Output Summary (Last 24 hours) at 06/13/17 1101 Last data filed at 06/13/17 1028  Gross per 24 hour  Intake             1505 ml  Output             2750 ml  Net            -1245 ml      Physical Exam   CVP 6 General:  Well appearing. No resp difficulty HEENT: normal Neck: supple. JVP 6. Carotids 2+ bilat; no bruits. No lymphadenopathy or thryomegaly appreciated. Cor: PMI laterally displaced. Regular rate & rhythm. No rubs, gallops or murmurs. Lungs: clear Abdomen: obese soft, nontender, nondistended. No hepatosplenomegaly. No bruits or masses. Good bowel sounds. Extremities: no cyanosis, clubbing, rash, edema Neuro: alert & orientedx3, cranial nerves grossly intact. moves all 4 extremities w/o difficulty. Affect pleasant   Telemetry   NSR 70-75s. Personally reviewed   EKG    A fib RVR   Labs    CBC  Recent Labs  06/12/17 0445 06/13/17 0514  WBC 9.9 8.5  HGB 12.8* 13.2  HCT 39.4 41.0  MCV 77.3* 77.9*  PLT 236 914   Basic Metabolic Panel  Recent Labs  06/11/17 0423 06/12/17 0445 06/13/17 0514    NA 134* 136 139  K 4.0 4.0 4.5  CL 103 105 108  CO2 22 24 25   GLUCOSE 232* 129* 125*  BUN 31* 30* 30*  CREATININE 2.20* 2.05* 1.82*  CALCIUM 8.5* 9.2 9.6  MG 1.7 2.1  --    Liver Function Tests No results for input(s): AST, ALT, ALKPHOS, BILITOT, PROT, ALBUMIN in the last 72 hours. No results for input(s): LIPASE, AMYLASE in the last 72 hours. Cardiac Enzymes No results for input(s): CKTOTAL, CKMB, CKMBINDEX, TROPONINI in the last 72 hours.  BNP: BNP (last 3 results)  Recent Labs  10/06/16 1242 06/09/17 1538  BNP 549.6* 1,110.6*    ProBNP (last 3 results) No results for input(s): PROBNP in the last 8760 hours.   D-Dimer No results for input(s): DDIMER in the last 72 hours. Hemoglobin A1C No results for input(s): HGBA1C in the last 72 hours. Fasting Lipid Panel No results for input(s): CHOL, HDL, LDLCALC, TRIG, CHOLHDL, LDLDIRECT in the last 72 hours. Thyroid Function Tests No results for input(s): TSH, T4TOTAL, T3FREE, THYROIDAB in the last 72 hours.  Invalid input(s): FREET3  Other results:   Imaging    No results found.   Medications:  Scheduled Medications: . allopurinol  100 mg Oral Daily  . amiodarone  400 mg Oral BID  . apixaban  5 mg Oral BID  . atorvastatin  80 mg Oral q1800  . Chlorhexidine Gluconate Cloth  6 each Topical Daily  . colchicine  0.6 mg Oral BID  . hydrALAZINE  75 mg Oral Q8H  . insulin aspart  0-15 Units Subcutaneous TID WC  . insulin aspart  0-5 Units Subcutaneous QHS  . isosorbide mononitrate  30 mg Oral Daily  . magnesium oxide  200 mg Oral Daily  . potassium chloride  40 mEq Oral BID  . sacubitril-valsartan  1 tablet Oral BID  . sodium chloride flush  10-40 mL Intracatheter Q12H  . sodium chloride flush  3 mL Intravenous Q12H  . torsemide  20 mg Oral BID    Infusions: . sodium chloride      PRN Medications: acetaminophen, ALPRAZolam, ALPRAZolam, hydrALAZINE, ondansetron (ZOFRAN) IV, sodium chloride flush,  sodium chloride flush, traMADol, zolpidem    Patient Profile   Jeffrey [name] is a 41 year old male with a past medical history of HTN, R renal cell carcinoma s/p nephrectomy, DM, OSA non compliant with CPAP, gout, morbid obesity, and chronic systolic CHF. EF 20-25% in Aug. 2018.   Admitted 06/08/17 with new onset Afib with RVR.  Assessment/Plan   1. New onset atrial fibrillation with RVR:  - Back in NSR. Continue amio 400 bid for 1 week then switch to 200 bid - Continue eliquis 5 mg twice a day.     - CHA2DS2/VASc is at least 3.  2. Acute/Chronic systolic CHF:   - Echo 5/63/89 EF 20-25%. [facility] SICD.  - Normal coronaries on cath 05/2015 -Todays CO-OX is 54%-> 65% -> 61% -CVP 8 . Continue torsemide 3. OSA - CPAP nightly and as needed.  4. Obesity - Encouraged weight loss with portion control and increased activitiy 5. AKI on CKD III - Creatinine peaked 2.3.Today creatinine down to 1.8.  6. DMII- on sliding scale.  7. Acute respiratory failure - Resolved. Continue nightime bipap for OSA   Ok for d/c today on  Hydralazine 75q8 Imdur 30 daily Amio 400 bid for 1 week then 200 bid Eliquis 5 bid (needs card) Torsemide 20 bid Entresto 49/51 bid  Spiro 25 atorva 80  Hold carvedilol for now   F/u HF Clinic 1 week.    Length of Stay: 5  Glori Bickers, MD  06/13/2017, 11:01 AM   Advanced Heart Failure Team Pager [phone] (M-F; 7a - 4p)  Please contact [facility] for night-coverage after hours (4p -7a ) and weekends on [email or web address]

## 2017-06-13 NOTE — Discharge Summary (Signed)
Discharge Summary    Patient ID: Jeffrey [name],  MRN: [id number], DOB/AGE: [birth year] 41 y.o.  Admit date: 06/08/2017 Discharge date: 06/13/2017  Primary Care Provider: Tresa Garter Primary Cardiologist: Dr. [name]  Discharge Diagnoses    1. Acute on chronic systolic HF -> cardiogenic shock 2. Non-ischemic CM 3. PAF, new onset 4. Acute on chronic renal failure, stage 3 5. Morbid obesity 6. OSA 7. Acute hypoxic respiratory failure due to #1  Allergies Allergies  Allergen Reactions  . Aspirin Shortness Of Breath, Itching and Rash     Burning sensation (Patient reports he tolerates other NSAIDS)   . Bee Venom Hives and Swelling  . Lisinopril Cough  . Tomato Rash    Diagnostic Studies/Procedures    2D echo (prior to admit 06/02/17) Study Conclusions  - Left ventricle: The cavity size was severely dilated. Wall   thickness was increased in a pattern of mild LVH. Systolic   function was severely reduced. The estimated ejection fraction   was in the range of 20% to 25%. Wall motion was normal; there   were no regional wall motion abnormalities. Doppler parameters   are consistent with abnormal left ventricular relaxation (grade 1   diastolic dysfunction). Doppler parameters are consistent with   high ventricular filling pressure. - Left atrium: The atrium was severely dilated.  Impressions:  - Compared to the prior study, there has been no significant   interval change   History of Present Illness    Jeffrey [name] is a 41 year old male with a past medical history of HTN, R renal cell carcinoma s/p nephrectomy, DM, OSA non compliant with CPAP, gout, morbid obesity, and chronic systolic CHF 2/2 NICM (LHC in 2016 with normal coronaries). EF 20-25% in Aug. 2018. He has an ICD.   Pt presented to the [facility] ED on 06/08/17 with complaint of dyspnea and palpitations and was found to be in new onset atrial fibrillation w/ RVR. Symptom onset was <48 hr from presentation to ED.  He was placed on IV heparin and IV amiodarone. He was also noted to be in acute/chronic CHF and admitted for further management.    Hospital Course     Pt admitted to telemetry and converted to NSR on amiodarone. He was later transition to PO amiodarone and started on Eliquis, 5 mg BID, for stroke prophylaxis given CHA2DS2 VASc score of 3.  For his a/c systolic HF, he was placed on IV Lasix for diuresis. He diuresed well, -5.3L out. Weight improved from 312 lb to 305 lb. He was transitioned to PO torsemide. All other HF meds were continued, except for coreg. Co-ox 61%. Discharge weight 305 lb.   Also of note, he had AKI on CKD. Creatinine peaked at 2.3 but later improved and was 1.8 day of discharge.   Pt was evaluated and followed  by Dr. [name] throughout admission. On 06/13/17, he was felt to be stable for discharge home. Discharge medication regimen outlined below.   Hydralazine 75q8 Imdur 30 daily Amio 400 bid for 1 week then 200 bid Eliquis 5 bid (needs card) Torsemide 20 bid Entresto 49/51 bid  Spiro 25 atorva 80  Hold carvedilol for now   F/u HF Clinic 1 week.    Consultants: none     Discharge Vitals Blood pressure (!) 114/98, pulse 75, temperature 97.6 F (36.4 C), temperature source Oral, resp. rate 19, height 5\' 8"  (1.727 m), weight (!) 305 lb 11.2 oz (138.7 kg), SpO2 99 %.  Filed Weights   06/11/17 0600 06/12/17 0510 06/13/17 0440  Weight: (!) 304 lb 14.3 oz (138.3 kg) (!) 306 lb 8 oz (139 kg) (!) 305 lb 11.2 oz (138.7 kg)    Labs & Radiologic Studies    CBC  Recent Labs  06/12/17 0445 06/13/17 0514  WBC 9.9 8.5  HGB 12.8* 13.2  HCT 39.4 41.0  MCV 77.3* 77.9*  PLT 236 025   Basic Metabolic Panel  Recent Labs  06/11/17 0423 06/12/17 0445 06/13/17 0514  NA 134* 136 139  K 4.0 4.0 4.5  CL 103 105 108  CO2 22 24 25   GLUCOSE 232* 129* 125*  BUN 31* 30* 30*  CREATININE 2.20* 2.05* 1.82*  CALCIUM 8.5* 9.2 9.6  MG 1.7 2.1  --    Liver  Function Tests No results for input(s): AST, ALT, ALKPHOS, BILITOT, PROT, ALBUMIN in the last 72 hours. No results for input(s): LIPASE, AMYLASE in the last 72 hours. Cardiac Enzymes No results for input(s): CKTOTAL, CKMB, CKMBINDEX, TROPONINI in the last 72 hours. BNP Invalid input(s): POCBNP D-Dimer No results for input(s): DDIMER in the last 72 hours. Hemoglobin A1C No results for input(s): HGBA1C in the last 72 hours. Fasting Lipid Panel No results for input(s): CHOL, HDL, LDLCALC, TRIG, CHOLHDL, LDLDIRECT in the last 72 hours. Thyroid Function Tests No results for input(s): TSH, T4TOTAL, T3FREE, THYROIDAB in the last 72 hours.  Invalid input(s): FREET3 _____________  Dg Chest 2 View  Result Date: 06/08/2017 CLINICAL DATA:  Chest tightness with shortness of breath EXAM: CHEST  2 VIEW COMPARISON:  October 06, 2016 FINDINGS: Lungs are clear. Heart is borderline prominent with pulmonary vascularity within normal limits. No adenopathy. No pneumothorax. No bone lesions. A pacemaker device is present on the left with the lead tip near the sternomanubrial junction anteriorly. IMPRESSION: No edema or consolidation.  Stable cardiac silhouette. Electronically Signed   By: Lowella Grip III M.D.   On: 06/08/2017 18:24   Dg Knee 2 Views Left  Result Date: 06/05/2017 CLINICAL DATA:  Chronic bilateral knee pain with swelling over the last 2 weeks, difficulty bearing weight EXAM: LEFT KNEE - 1-2 VIEW COMPARISON:  06/05/2017 FINDINGS: Mild tricompartmental osteoarthritis with slight joint space loss, sclerosis and bony spurring. Medial compartment is most affected. Normal alignment without acute osseous finding, fracture, or effusion. No subluxation or dislocation. No definite soft tissue abnormality. IMPRESSION: Mild tricompartmental osteoarthritis without acute process by plain radiography Electronically Signed   By: Jerilynn Mages.  Shick M.D.   On: 06/05/2017 13:36   Dg Knee 2 Views Right  Result Date:  06/05/2017 CLINICAL DATA:  Bilateral knee pain and swelling EXAM: RIGHT KNEE - 1-2 VIEW COMPARISON:  06/05/2017 FINDINGS: Similar mild tricompartmental osteoarthritis, most pronounced in the medial compartment with joint space loss, sclerosis and bony spurring. No malalignment, fracture or acute osseous finding. No large joint effusion. No significant soft tissue abnormality. IMPRESSION: Mild tricompartmental osteoarthritis without acute finding by plain radiography. Electronically Signed   By: Jerilynn Mages.  Shick M.D.   On: 06/05/2017 13:37   Dg Chest Port 1 View  Result Date: 06/10/2017 CLINICAL DATA:  PICC line placement. EXAM: PORTABLE CHEST 1 VIEW COMPARISON:  Radiograph of June 09, 2017. FINDINGS: Stable cardiomegaly with mild central pulmonary vascular congestion is noted. No pneumothorax or pleural effusion is noted. No consolidative process is noted. Bony thorax is unremarkable. Interval placement of right-sided PICC line with distal tip in expected position of SVC. Bony thorax is unremarkable. IMPRESSION: Stable cardiomegaly with  mild central pulmonary vascular congestion. Interval placement of right-sided PICC line with distal tip in expected position of SVC. Electronically Signed   By: Marijo Conception, M.D.   On: 06/10/2017 14:39   Dg Chest Port 1 View  Result Date: 06/09/2017 CLINICAL DATA:  Dyspnea EXAM: PORTABLE CHEST 1 VIEW COMPARISON:  Chest radiograph from one day prior. FINDINGS: Stable configuration of single lead epicardial left chest ICD. Stable cardiomediastinal silhouette with mild cardiomegaly. No pneumothorax. No pleural effusion. Mild pulmonary edema, new. IMPRESSION: New mild congestive heart failure. Electronically Signed   By: Ilona Sorrel M.D.   On: 06/09/2017 16:14   Disposition   Pt is being discharged home today in good condition.  Follow-up Plans & Appointments    Follow-up [facility] Follow up on 06/29/2017.   Why:  9:15 am for hospital  follow up Contact information: [place] [phone] [place], Shaune Pascal, MD Follow up.   Specialty:  Cardiology Why:  our office will call you witih a follow-up appointment in 1 week  Contact information: [address] [place] [address] [phone]          Discharge Instructions    Amb Referral to Cardiac Rehabilitation    Complete by:  As directed    Diagnosis:  Heart Failure (see criteria below if ordering Phase II)   Heart Failure Type:  Chronic Systolic   Diet - low sodium heart healthy    Complete by:  As directed    Increase activity slowly    Complete by:  As directed       Discharge Medications   Current Discharge Medication List    START taking these medications   Details  amiodarone (PACERONE) 200 MG tablet Take 400 mg 2x/day for 7 days. Starting 06/20/17 reduce dose to 200 mg 2x/day Qty: 90 tablet, Refills: 3    !! apixaban (ELIQUIS) 5 MG TABS tablet Take 1 tablet (5 mg total) by mouth 2 (two) times daily. Qty: 60 tablet, Refills: 10    !! apixaban (ELIQUIS) 5 MG TABS tablet Take 1 tablet (5 mg total) by mouth 2 (two) times daily. Qty: 60 tablet, Refills: 0     !! - Potential duplicate medications found. Please discuss with provider.    CONTINUE these medications which have NOT CHANGED   Details  allopurinol (ZYLOPRIM) 100 MG tablet Take 1 tablet (100 mg total) by mouth daily. Qty: 90 tablet, Refills: 3   Associated Diagnoses: Gouty arthritis    atorvastatin (LIPITOR) 80 MG tablet TAKE 1 TABLET (80 MG TOTAL) BY MOUTH EVERY EVENING. Qty: 90 tablet, Refills: 3   Associated Diagnoses: Dyslipidemia    colchicine 0.6 MG tablet Take 1 tablet (0.6 mg total) by mouth 2 (two) times daily. Qty: 180 tablet, Refills: 3   Associated Diagnoses: Gouty arthritis    diazepam (VALIUM) 5 MG tablet Take 1 tablet (5 mg total) by mouth every 8 (eight) hours as needed (Vertigo.  Take if symptoms are not relieved by meclizine (Antivert ) alone.). Qty: 20 tablet, Refills: 0    hydrALAZINE (APRESOLINE) 50 MG tablet Take 1.5 tablets (75 mg total) by mouth 3 (three) times daily. Qty: 180 tablet, Refills: 3   Associated Diagnoses: Chronic systolic congestive heart failure (HCC)    insulin NPH-regular Human (NOVOLIN 70/30) (70-30) 100 UNIT/ML injection Inject 34 Units into the skin 2 (two)  times daily with a meal. Qty: 3 vial, Refills: 3   Associated Diagnoses: Type 2 diabetes mellitus with stage 3 chronic kidney disease, with long-term current use of insulin (HCC)    isosorbide mononitrate (IMDUR) 30 MG 24 hr tablet Take 1 tablet (30 mg total) by mouth daily. Qty: 90 tablet, Refills: 3   Associated Diagnoses: Chronic systolic congestive heart failure (HCC)    loratadine (CLARITIN) 10 MG tablet Take 1 tablet (10 mg total) by mouth daily. Qty: 30 tablet, Refills: 3   Associated Diagnoses: Pollen allergy    Magnesium Oxide 200 MG TABS Take 1 tablet (200 mg total) by mouth daily. Qty: 90 tablet, Refills: 2    meclizine (ANTIVERT) 25 MG tablet Take 1 tablet (25 mg total) by mouth 3 (three) times daily as needed for dizziness (Take meclizine for spinning dizziness. You may also take Valium if your symptoms are not relieved by the meclizine alone.). Qty: 30 tablet, Refills: 0    sacubitril-valsartan (ENTRESTO) 49-51 MG Take 1 tablet by mouth 2 (two) times daily. Qty: 180 tablet, Refills: 3   Associated Diagnoses: Chronic systolic congestive heart failure (HCC)    spironolactone (ALDACTONE) 25 MG tablet Take 1 tablet (25 mg total) by mouth daily. Qty: 90 tablet, Refills: 3   Associated Diagnoses: Chronic systolic congestive heart failure (HCC)    torsemide (DEMADEX) 20 MG tablet Take 1 tablet (20 mg total) by mouth daily. Qty: 90 tablet, Refills: 3   Associated Diagnoses: Chronic systolic congestive heart failure (HCC)    traMADol (ULTRAM) 50 MG tablet Take 1 tablet  (50 mg total) by mouth every 6 (six) hours as needed. Qty: 20 tablet, Refills: 0      STOP taking these medications     carvedilol (COREG) 25 MG tablet            Outstanding Labs/Studies   None   Duration of Discharge Encounter   Greater than 30 minutes including physician time.  Signed, Lyda Jester PA-C 06/13/2017, 11:54 AM  Patient seen and examined with the above-signed Advanced Practice Provider and/or Housestaff. I personally reviewed laboratory data, imaging studies and relevant notes. I independently examined the patient and formulated the important aspects of the plan. I have edited the note to reflect any of my changes or salient points. I have personally discussed the plan with the patient and/or family.  He is stable for d/c. Will need close f/u in HF Clinic.   Glori Bickers, MD  8:53 PM

## 2017-06-14 MED FILL — **ENTRESTO 49-51 MG TABLET: 49-51 | 7 days supply | Qty: 14 | Fill #1

## 2017-06-14 MED FILL — ALLOPURINOL 100 MG TABLET: 100 | 30 days supply | Qty: 30 | Fill #0

## 2017-06-14 MED FILL — ATORVASTATIN 80 MG TABLET: 80 | 30 days supply | Qty: 30 | Fill #1

## 2017-06-14 MED FILL — NOVOLIN 70/30 100 UNITS/ML: (70-30) 100 | 14 days supply | Qty: 10 | Fill #1

## 2017-06-14 MED FILL — AMIODARONE HCL 200 MG TAB: 200 | 30 days supply | Qty: 74 | Fill #0

## 2017-06-15 ENCOUNTER — Telehealth (HOSPITAL_COMMUNITY): Payer: Self-pay | Admitting: *Deleted

## 2017-06-15 NOTE — Telephone Encounter (Signed)
Pt called stating he was supposed to call our office to get set up for CPAP. Pt currently has a CPAP device but has lost or damaged parts to the device. Contacted Melissa with [employer] and since pt is uninsured he would be self pay. The patient stated he could not afford the device. Original CPAP came from "[employer]" and all parts needed to fix ( if device could be fixed)  would be considered private pay (pt would pay out of pocket). Until patient is insured he can not financially afford CPAP. Will follow up with Social Worker Kennyth Lose to see if we can help get insurance or what options we have at this point. Pt aware.   Message routed to [facility]

## 2017-06-16 ENCOUNTER — Telehealth: Payer: Self-pay | Admitting: Licensed Clinical Social Worker

## 2017-06-16 NOTE — Telephone Encounter (Signed)
CSW contacted patient per referral to assist with broken CPAP machine. Patient reports he has had his CPAP for several years and has some broken tubing. Patient reports he can't afford to fix. CSW inquired about amount and patient unsure cost but will follow up with [facility] regarding specifics of repair. CSW followed up regarding medicaid and states he spoke with worker yesterday who states it is still pending. CSW basked for name and number of worker and patient states he will bring to HF clinic appointment on Friday. CSW will follow up once patient provides needed information. Raquel Sarna, [place], [place]

## 2017-06-18 ENCOUNTER — Encounter (HOSPITAL_COMMUNITY): Payer: Self-pay

## 2017-06-22 ENCOUNTER — Encounter (HOSPITAL_COMMUNITY): Payer: Self-pay

## 2017-06-23 ENCOUNTER — Encounter (HOSPITAL_COMMUNITY): Payer: Self-pay | Admitting: Student

## 2017-06-23 ENCOUNTER — Encounter (HOSPITAL_COMMUNITY): Payer: Self-pay

## 2017-06-23 ENCOUNTER — Ambulatory Visit (HOSPITAL_COMMUNITY)
Admission: RE | Admit: 2017-06-23 | Discharge: 2017-06-23 | Disposition: A | Discharge status: Home / Self Care | Payer: Self-pay | Source: Ambulatory Visit | Attending: Internal Medicine | Admitting: Internal Medicine

## 2017-06-23 VITALS — BP 122/84 | HR 87 | Wt 310.4 lb

## 2017-06-23 DIAGNOSIS — Z888 Allergy status to other drugs, medicaments and biological substances status: Secondary | ICD-10-CM | POA: Insufficient documentation

## 2017-06-23 DIAGNOSIS — I429 Cardiomyopathy, unspecified: Secondary | ICD-10-CM | POA: Insufficient documentation

## 2017-06-23 DIAGNOSIS — M109 Gout, unspecified: Secondary | ICD-10-CM | POA: Insufficient documentation

## 2017-06-23 DIAGNOSIS — Z7901 Long term (current) use of anticoagulants: Secondary | ICD-10-CM | POA: Insufficient documentation

## 2017-06-23 DIAGNOSIS — I480 Paroxysmal atrial fibrillation: Secondary | ICD-10-CM | POA: Insufficient documentation

## 2017-06-23 DIAGNOSIS — Z905 Acquired absence of kidney: Secondary | ICD-10-CM | POA: Insufficient documentation

## 2017-06-23 DIAGNOSIS — Z9889 Other specified postprocedural states: Secondary | ICD-10-CM | POA: Insufficient documentation

## 2017-06-23 DIAGNOSIS — Z91018 Allergy to other foods: Secondary | ICD-10-CM | POA: Insufficient documentation

## 2017-06-23 DIAGNOSIS — Z91030 Bee allergy status: Secondary | ICD-10-CM | POA: Insufficient documentation

## 2017-06-23 DIAGNOSIS — N183 Chronic kidney disease, stage 3 unspecified: Secondary | ICD-10-CM

## 2017-06-23 DIAGNOSIS — I10 Essential (primary) hypertension: Secondary | ICD-10-CM

## 2017-06-23 DIAGNOSIS — Z794 Long term (current) use of insulin: Secondary | ICD-10-CM | POA: Insufficient documentation

## 2017-06-23 DIAGNOSIS — Z886 Allergy status to analgesic agent status: Secondary | ICD-10-CM | POA: Insufficient documentation

## 2017-06-23 DIAGNOSIS — Z85528 Personal history of other malignant neoplasm of kidney: Secondary | ICD-10-CM | POA: Insufficient documentation

## 2017-06-23 DIAGNOSIS — Z79891 Long term (current) use of opiate analgesic: Secondary | ICD-10-CM | POA: Insufficient documentation

## 2017-06-23 DIAGNOSIS — J45909 Unspecified asthma, uncomplicated: Secondary | ICD-10-CM | POA: Insufficient documentation

## 2017-06-23 DIAGNOSIS — I130 Hypertensive heart and chronic kidney disease with heart failure and stage 1 through stage 4 chronic kidney disease, or unspecified chronic kidney disease: Secondary | ICD-10-CM | POA: Insufficient documentation

## 2017-06-23 DIAGNOSIS — I5022 Chronic systolic (congestive) heart failure: Secondary | ICD-10-CM | POA: Insufficient documentation

## 2017-06-23 DIAGNOSIS — E7800 Pure hypercholesterolemia, unspecified: Secondary | ICD-10-CM | POA: Insufficient documentation

## 2017-06-23 DIAGNOSIS — E1122 Type 2 diabetes mellitus with diabetic chronic kidney disease: Secondary | ICD-10-CM | POA: Insufficient documentation

## 2017-06-23 DIAGNOSIS — F419 Anxiety disorder, unspecified: Secondary | ICD-10-CM | POA: Insufficient documentation

## 2017-06-23 DIAGNOSIS — Z79899 Other long term (current) drug therapy: Secondary | ICD-10-CM | POA: Insufficient documentation

## 2017-06-23 DIAGNOSIS — G4733 Obstructive sleep apnea (adult) (pediatric): Secondary | ICD-10-CM | POA: Insufficient documentation

## 2017-06-23 MED ORDER — AMIODARONE HCL 200 MG PO TABS: 200.0000 mg | ORAL_TABLET | Freq: Every day | ORAL | 3 refills | Status: DC

## 2017-06-23 MED ORDER — SPIRONOLACTONE 25 MG PO TABS: 25.0000 mg | ORAL_TABLET | Freq: Every day | ORAL | 3 refills | Status: DC

## 2017-06-23 MED ORDER — ISOSORBIDE MONONITRATE ER 30 MG PO TB24: 30.0000 mg | ORAL_TABLET | Freq: Every day | ORAL | 3 refills | Status: DC

## 2017-06-23 MED ORDER — TORSEMIDE 20 MG PO TABS: 20.0000 mg | ORAL_TABLET | Freq: Every day | ORAL | 3 refills | Status: DC

## 2017-06-23 MED FILL — AMIODARONE HCL 200 MG TAB: 200 | 30 days supply | Qty: 30 | Fill #0

## 2017-06-23 MED FILL — TORSEMIDE 20 MG TABLET: 20 | 30 days supply | Qty: 30 | Fill #0

## 2017-06-23 MED FILL — ISOSORBIDE MN ER 30 MG TAB: 30 | 30 days supply | Qty: 30 | Fill #0

## 2017-06-23 MED FILL — SPIRONOLACTONE 25 MG TABLET: 25 | 30 days supply | Qty: 30 | Fill #0

## 2017-06-23 NOTE — Progress Notes (Signed)
CSW met with patient in the clinic. Patient reports he is working with a Chief Executive Officer on his disability application. Patient reports he has been denied twice in the past. Patient states case is in disability determination and hopeful for resolution soon. CSW available as needed. Raquel Sarna, [place], [place]

## 2017-06-23 NOTE — Progress Notes (Signed)
Patient ID: Jeffrey [name], male   DOB: Nov 29, 1975, 41 y.o.   MRN: [id number]      Advanced Heart Failure Clinic Note   Patient Name: Jeffrey [name] Date of Encounter: 06/23/2017  Primary Care Provider:  Tresa Garter, MD Primary Cardiologist:  Lizbeth Bark, MD  Primary HF Cardiologist: Bensimhon  History: 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    Admitted to [facility] in 3/16 secondary to atypical chest pain, dyspnea, and mild troponin elevation. Echo during hospitalization shows "severely reduced LV function" - EF not quantified due to poor windows. He was diuresed and was down about 7 pounds at discharge, at 289 on the hospital scale.   Admitted 05/14/15 with CP at rest. CXR looked improved from previous. CP resolved spontaneously after hospitalization and troponin was flat. R/LHC showed normal coronaries and moderately elevated filling pressures with moderately depressed cardiac output. He had been previously seen by EP and approved for ICD with chronic low EF. [place] subcutaneous ICD was implanted 05/17/15.  He was diuresed with IV lasix 40 mg. Discharge weight 290 lb.  Admitted 4/11 through 01/16/16 with volume overload in the setting of missed medications. Diuresed with IV lasix and transitioned to torsemide 20 mg daily. Discharge weight was 293 pounds.   Admitted 9/4 - 01/04/27 with A/C systolic CHF in the setting of new afib. Started on IV lasix and amiodarone. Pt diuresed and converted to NSR without requiring DCCV. Started on Eliquis for [facility]. Continued on po amio on discharge with wean over several weeks.  He presents today for post hospital follow up. Overall feeling good.  Denies SOB or palpitations/tachycardia. Walking 10-15 minutes most days. Mostly limited from by knee and ankle pain. Weight at home ~307-310.  Denies dizziness and lightheadedness. Taking all medications as directed. 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 Denies orthopnea or PND.   EKG today shows NSR at 77 bpm  ECHO 01/16/2016 EF 20-25%  Grade III DD Echo 05/07/15.  EF 20-25%, grade II diastolic dysfunction, diffuse hypokinesis, moderate LV dilation, mild LVH, normal RV size with mildly decreased systolic function.   Echo in 2012 with EF 20-25%.  In 1/13 had nuclear study EF 45-50% suggestive of NICM.   [facility] 05/17/15 RA = 7 RV = 42/3/7 PA = 43/30 (39) PCW = 26 Fick cardiac output/index = 4.9/2.1 PVR = 2.7 WU SVR = 1651 FA sat = 95% PA sat = 65%, 65% Ao = 122/97 (109) LV = 110/31/33 1) Normal coronaries 2) Moderately elevated filling pressures with moderately depressed cardiac output   Past Medical History:  Diagnosis Date  . Anxiety   . Aspirin allergy   . Childhood asthma   . Chronic systolic CHF (congestive heart failure) ([place])    a. EF 20-25% in 2012. b. EF 45-50% in 10/2011 with nonischemic nuc - presumed NICM. c. 12/2014 Echo: Sev depressed LV fxn, sev dil LV, mild LVH, mild MR, sev dil LA, mildly reduced RV fxn.  . CKD (chronic kidney disease) stage 2, GFR 60-89 ml/min   . High cholesterol   . Hypertension   . Morbid obesity ([place])   . Nephrolithiasis   . OSA on CPAP   . Presumed NICM    a. 04/2014 Myoview: EF 26%, glob HK, sev glob HK, ? prior infarct;  b. Never  cathed 2/2 CKD.  [name] Renal cell carcinoma ([place])    a. s/p Rt robotic assisted partial converted to radical nephrectomy on 01/2013.  . Troponin level elevated    a. 04/2014, 12/2014: felt due to CHF.  . Type II diabetes mellitus ([place])    Past Surgical History:  Procedure Laterality Date  . APPENDECTOMY  07/2004  . CARDIAC CATHETERIZATION N/A 05/17/2015   Procedure: Right/Left Heart Cath and Coronary Angiography;  Surgeon: Jolaine Artist, MD;  Location: [place] CV [facility];  Service: Cardiovascular;  Laterality: N/A;  . EP IMPLANTABLE DEVICE  N/A 05/17/2015   Procedure: SubQ ICD Implant;  Surgeon: Will Meredith Leeds, MD;  Location: [place] CV [facility];  Service: Cardiovascular;  Laterality: N/A;  . ROBOTIC ASSISTED LAPAROSCOPIC LYSIS OF ADHESION  01/13/2013   Procedure: ROBOTIC ASSISTED LAPAROSCOPIC LYSIS OF ADHESION EXTENSIVE;  Surgeon: Alexis Frock, MD;  [facility];  Service: Urology;;  . ROBOTIC ASSITED PARTIAL NEPHRECTOMY Right 01/13/2013   Procedure: ROBOTIC ASSITED PARTIAL NEPHRECTOMY CONVERTED TO ROBOTIC ASSISTED RIGHT RADICAL NEPHRECTOMY;  Surgeon: Alexis Frock, MD;  Location: [facility];  Service: Urology;  Laterality: Right;    Allergies  Allergies  Allergen Reactions  . Aspirin Shortness Of Breath, Itching and Rash     Burning sensation (Patient reports he tolerates other NSAIDS)   . Bee Venom Hives and Swelling  . Lisinopril Cough  . Tomato Rash     Home Medications Current Outpatient Prescriptions  Medication Sig Dispense Refill  . allopurinol (ZYLOPRIM) 100 MG tablet Take 1 tablet (100 mg total) by mouth daily. 90 tablet 3  . amiodarone (PACERONE) 200 MG tablet Take 400 mg 2x/day for 7 days. Starting 06/20/17 reduce dose to 200 mg 2x/day 90 tablet 3  . apixaban (ELIQUIS) 5 MG TABS tablet Take 1 tablet (5 mg total) by mouth 2 (two) times daily. 60 tablet 10  . apixaban (ELIQUIS) 5 MG TABS tablet Take 1 tablet (5 mg total) by mouth 2 (two) times daily. 60 tablet 0  . atorvastatin (LIPITOR) 80 MG tablet TAKE 1 TABLET (80 MG TOTAL) BY MOUTH EVERY EVENING. 90 tablet 3  . colchicine 0.6 MG tablet Take 1 tablet (0.6 mg total) by mouth 2 (two) times daily. (Patient taking differently: Take 0.6 mg by mouth 2 (two) times daily as needed (for gout flares). ) 180 tablet 3  . diazepam (VALIUM) 5 MG tablet Take 1 tablet (5 mg total) by mouth every 8 (eight) hours as needed (Vertigo. Take if symptoms are not relieved by meclizine (Antivert ) alone.). (Patient taking differently: Take 5 mg by mouth every 8 (eight) hours as  needed (for vertigo and take only if symptoms are not relieved by meclizine (Antivert ) alone.). ) 20 tablet 0  . hydrALAZINE (APRESOLINE) 50 MG tablet Take 1.5 tablets (75 mg total) by mouth 3 (three) times daily. 180 tablet 3  . insulin NPH-regular Human (NOVOLIN 70/30) (70-30) 100 UNIT/ML injection Inject 34 Units into the skin 2 (two) times daily with a meal. 3 vial 3  . isosorbide mononitrate (IMDUR) 30 MG 24 hr tablet Take 1 tablet (30 mg total) by mouth daily. 90 tablet 3  . loratadine (CLARITIN) 10 MG tablet Take 1 tablet (10 mg total) by mouth daily. (Patient taking differently: Take 10 mg by mouth daily as needed for allergies. ) 30 tablet 3  . Magnesium Oxide 200 MG TABS Take 1 tablet (200 mg total) by mouth daily. (Patient taking differently: Take 100-200 mg by mouth daily. )  90 tablet 2  . meclizine (ANTIVERT) 25 MG tablet Take 1 tablet (25 mg total) by mouth 3 (three) times daily as needed for dizziness (Take meclizine for spinning dizziness. You may also take Valium if your symptoms are not relieved by the meclizine alone.). 30 tablet 0  . sacubitril-valsartan (ENTRESTO) 49-51 MG Take 1 tablet by mouth 2 (two) times daily. 180 tablet 3  . spironolactone (ALDACTONE) 25 MG tablet Take 1 tablet (25 mg total) by mouth daily. 90 tablet 3  . torsemide (DEMADEX) 20 MG tablet Take 1 tablet (20 mg total) by mouth daily. (Patient taking differently: Take 20-40 mg by mouth See admin instructions. 20 mg once a day and a second dose of 20 mg if retaining fluid) 90 tablet 3  . traMADol (ULTRAM) 50 MG tablet Take 1 tablet (50 mg total) by mouth every 6 (six) hours as needed. (Patient taking differently: Take 50 mg by mouth every 6 (six) hours as needed (for pain). ) 20 tablet 0   No current facility-administered medications for this encounter.    Review of systems complete and found to be negative unless listed in HPI.    Physical Exam  Vitals:   06/23/17 0901  BP: 122/84  Pulse: 87  SpO2: 94%    Weight: (!) 310 lb 6.4 oz (140.8 kg)   Wt Readings from Last 3 Encounters:  06/23/17 (!) 310 lb 6.4 oz (140.8 kg)  06/13/17 (!) 305 lb 11.2 oz (138.7 kg)  06/05/17 (!) 310 lb (140.6 kg)   General:  Well appearing. No resp difficulty HEENT: normal Neck: supple. no JVD. Carotids 2+ bilat; no bruits. No lymphadenopathy or thryomegaly appreciated. Cor: PMI nondisplaced. Regular rate & rhythm. No rubs, gallops or murmurs. Lungs: clear Abdomen: obese, soft, nontender, nondistended. No hepatosplenomegaly. No bruits or masses. Good bowel sounds. Extremities: no cyanosis, clubbing, rash, edema Neuro: alert & orientedx3, cranial nerves grossly intact. moves all 4 extremities w/o difficulty. Affect pleasant  Assessment & Plan 1.  Chronic systolic congestive heart failure: s/p [employer] subcutaneous ICD implant 05/17/15.  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 Echo 01/2016  EF 20-25%.  - Echo 06/02/17 EF 20-25% - NYHA II symptoms.  - Continue torsemide 20 mg daily.  Can take extra as needed - Continue Entresto 49/51 mg BID. Will be careful with titration in setting of solitary kidney.  - Continue hydralazine 75 mg three times a day.    - Continue Imdur 30 mg daily.  - Continue spironolactone 25 mg daily  - Reinforced fluid restriction to < 2 L daily, sodium restriction to less than 2000 mg daily, and the importance of daily weights.   2. Paroxysmal Afib - New diagnosis this month.  - Now on Eliquis and Amiodarone - Continue amiodarone 200 mg BID. Decrease to 200 mg daily next week.  - Denies bleeding on eliquis.  2. Hypertension:  - Stable on current meds.  3. CKD stage III:  - BMET today. 4. OSA:  - Not using CPAP. CPAP machine broken. Have asked patient to follow up [facility] regarding CPAP equipment repair.  5. Tobacco use: - Quit 1 year ago. No change. 6. Gout:  - Continue allopurinol 200 daily for prophylaxis. No change to current plan.  7. DM2:  -Stable.  8.Renal Cell  Carcinoma - S/P nephrectomy 2014.  9. Morbid Obesity -Discussed portion control.  No change.  Follow up 3-4 weeks for recheck. Doing well overall now back in NSR.   Shirley Friar, PA-C  9:25 AM

## 2017-06-23 NOTE — Patient Instructions (Addendum)
On Monday September 24 Decrease Amiodarone 200 mg (1 tab) daily   Refills on Spironolactone, Torsemide, and Imdur has been placed  Your physician recommends that you schedule a follow-up appointment in: 3-4 weeks in APP Clinic

## 2017-06-29 ENCOUNTER — Ambulatory Visit: Payer: Self-pay | Admitting: Family Medicine

## 2017-07-01 ENCOUNTER — Ambulatory Visit: Payer: Self-pay | Attending: Internal Medicine

## 2017-07-09 ENCOUNTER — Other Ambulatory Visit: Payer: Self-pay | Admitting: *Deleted

## 2017-07-09 DIAGNOSIS — N183 Chronic kidney disease, stage 3 (moderate): Principal | ICD-10-CM

## 2017-07-09 DIAGNOSIS — E1122 Type 2 diabetes mellitus with diabetic chronic kidney disease: Secondary | ICD-10-CM

## 2017-07-09 DIAGNOSIS — Z794 Long term (current) use of insulin: Principal | ICD-10-CM

## 2017-07-09 DIAGNOSIS — I5022 Chronic systolic (congestive) heart failure: Secondary | ICD-10-CM

## 2017-07-09 MED ORDER — APIXABAN 5 MG PO TABS: 5.0000 mg | ORAL_TABLET | Freq: Two times a day (BID) | ORAL | 3 refills | Status: DC

## 2017-07-09 MED ORDER — INSULIN NPH ISOPHANE & REGULAR (70-30) 100 UNIT/ML ~~LOC~~ SUSP
34.0000 [IU] | Freq: Two times a day (BID) | SUBCUTANEOUS | 3 refills | Status: DC
Start: 2017-07-09 — End: 2018-10-31

## 2017-07-09 MED ORDER — SACUBITRIL-VALSARTAN 49-51 MG PO TABS: 1.0000 | ORAL_TABLET | Freq: Two times a day (BID) | ORAL | 3 refills | Status: DC

## 2017-07-09 NOTE — Telephone Encounter (Signed)
PRINTED FOR PASS PROGRAM 

## 2017-07-12 ENCOUNTER — Ambulatory Visit (INDEPENDENT_AMBULATORY_CARE_PROVIDER_SITE_OTHER): Payer: Self-pay | Admitting: *Deleted

## 2017-07-12 ENCOUNTER — Ambulatory Visit: Payer: Self-pay | Admitting: Family Medicine

## 2017-07-12 DIAGNOSIS — I420 Dilated cardiomyopathy: Secondary | ICD-10-CM

## 2017-07-13 NOTE — Progress Notes (Signed)
Remote ICD transmission.   

## 2017-07-14 LAB — CUP PACEART REMOTE DEVICE CHECK
Battery Remaining Percentage: 76 %
Date Time Interrogation Session: 20181008113300
Implantable Lead Implant Date: 20160812
Implantable Lead Model: 3401
MDC IDC LEAD LOCATION: 753858
MDC IDC PG IMPLANT DT: 20160812
Pulse Gen Serial Number: 117162

## 2017-07-15 ENCOUNTER — Encounter: Payer: Self-pay | Admitting: Cardiology

## 2017-07-22 ENCOUNTER — Encounter (HOSPITAL_COMMUNITY): Payer: Self-pay

## 2017-07-22 ENCOUNTER — Ambulatory Visit (HOSPITAL_COMMUNITY)
Admission: RE | Admit: 2017-07-22 | Discharge: 2017-07-22 | Disposition: A | Discharge status: Home / Self Care | Payer: Self-pay | Source: Ambulatory Visit | Attending: Cardiology | Admitting: Cardiology

## 2017-07-22 VITALS — BP 147/88 | HR 82 | Wt 312.1 lb

## 2017-07-22 DIAGNOSIS — I10 Essential (primary) hypertension: Secondary | ICD-10-CM

## 2017-07-22 DIAGNOSIS — I5022 Chronic systolic (congestive) heart failure: Secondary | ICD-10-CM

## 2017-07-22 DIAGNOSIS — E1122 Type 2 diabetes mellitus with diabetic chronic kidney disease: Secondary | ICD-10-CM | POA: Insufficient documentation

## 2017-07-22 DIAGNOSIS — Z6841 Body Mass Index (BMI) 40.0 and over, adult: Secondary | ICD-10-CM | POA: Insufficient documentation

## 2017-07-22 DIAGNOSIS — N183 Chronic kidney disease, stage 3 unspecified: Secondary | ICD-10-CM

## 2017-07-22 DIAGNOSIS — Z85528 Personal history of other malignant neoplasm of kidney: Secondary | ICD-10-CM | POA: Insufficient documentation

## 2017-07-22 DIAGNOSIS — Z79899 Other long term (current) drug therapy: Secondary | ICD-10-CM | POA: Insufficient documentation

## 2017-07-22 DIAGNOSIS — I480 Paroxysmal atrial fibrillation: Secondary | ICD-10-CM

## 2017-07-22 DIAGNOSIS — I130 Hypertensive heart and chronic kidney disease with heart failure and stage 1 through stage 4 chronic kidney disease, or unspecified chronic kidney disease: Secondary | ICD-10-CM | POA: Insufficient documentation

## 2017-07-22 DIAGNOSIS — Z905 Acquired absence of kidney: Secondary | ICD-10-CM | POA: Insufficient documentation

## 2017-07-22 DIAGNOSIS — Z7901 Long term (current) use of anticoagulants: Secondary | ICD-10-CM | POA: Insufficient documentation

## 2017-07-22 DIAGNOSIS — G4733 Obstructive sleep apnea (adult) (pediatric): Secondary | ICD-10-CM

## 2017-07-22 DIAGNOSIS — Z87891 Personal history of nicotine dependence: Secondary | ICD-10-CM | POA: Insufficient documentation

## 2017-07-22 DIAGNOSIS — I429 Cardiomyopathy, unspecified: Secondary | ICD-10-CM | POA: Insufficient documentation

## 2017-07-22 DIAGNOSIS — M109 Gout, unspecified: Secondary | ICD-10-CM

## 2017-07-22 DIAGNOSIS — Z794 Long term (current) use of insulin: Secondary | ICD-10-CM | POA: Insufficient documentation

## 2017-07-22 LAB — COMPREHENSIVE METABOLIC PANEL
ALBUMIN: 3.8 g/dL (ref 3.5–5.0)
ALT: 24 U/L (ref 17–63)
AST: 36 U/L (ref 15–41)
Alkaline Phosphatase: 78 U/L (ref 38–126)
Anion gap: 11 (ref 5–15)
BUN: 22 mg/dL — AB (ref 6–20)
CHLORIDE: 105 mmol/L (ref 101–111)
CO2: 21 mmol/L — AB (ref 22–32)
Calcium: 9.3 mg/dL (ref 8.9–10.3)
Creatinine, Ser: 1.68 mg/dL — ABNORMAL HIGH (ref 0.61–1.24)
GFR calc Af Amer: 56 mL/min — ABNORMAL LOW (ref >60)
GFR calc non Af Amer: 49 mL/min — ABNORMAL LOW (ref >60)
GLUCOSE: 178 mg/dL — AB (ref 65–99)
POTASSIUM: 4.7 mmol/L (ref 3.5–5.1)
Sodium: 137 mmol/L (ref 135–145)
Total Bilirubin: 1 mg/dL (ref 0.3–1.2)
Total Protein: 7 g/dL (ref 6.5–8.1)

## 2017-07-22 LAB — TSH: TSH: 2.535 u[IU]/mL (ref 0.350–4.500)

## 2017-07-22 MED ORDER — HYDRALAZINE HCL 100 MG PO TABS: 100.0000 mg | ORAL_TABLET | Freq: Three times a day (TID) | ORAL | 6 refills | Status: DC

## 2017-07-22 NOTE — Patient Instructions (Addendum)
Routine lab work today. Will notify you of abnormal results, otherwise no news is good news!  INCREASE Hydralazine to 100 mg three times daily. May double up on your current 50 mg tablets (Take 2 tabs three times daily). New Rx has been sent to your pharmacy for 100 mg tablets (Take 1 tab three times daily).  Follow up 6-8 weeks with Dr. [name].  _________________________________________________________________  Jeffrey [name] Code:   Take all medication as prescribed the day of your appointment. Bring all medications with you to your appointment.  Do the following things EVERYDAY: 1) Weigh yourself in the morning before breakfast. Write it down and keep it in a log. 2) Take your medicines as prescribed 3) Eat low salt foods-Limit salt (sodium) to 2000 mg per day.  4) Stay as active as you can everyday 5) Limit all fluids for the day to less than 2 liters

## 2017-07-22 NOTE — Progress Notes (Signed)
Patient ID: KALIJAH ZEISS, male   DOB: 09-04-1976, 41 y.o.   MRN: [id number]      Advanced Heart Failure Clinic Note   Patient Name: Jeffrey [name] Date of Encounter: 07/22/2017  Primary Care Provider:  Tresa Garter, MD Primary Cardiologist:  Lizbeth Bark, MD  Primary HF Cardiologist: Bensimhon  History: Ms. 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    Admitted to [facility] in 3/16 secondary to atypical chest pain, dyspnea, and mild troponin elevation. Echo during hospitalization shows "severely reduced LV function" - EF not quantified due to poor windows. He was diuresed and was down about 7 pounds at discharge, at 289 on the hospital scale.   Admitted 05/14/15 with CP at rest. CXR looked improved from previous. CP resolved spontaneously after hospitalization and troponin was flat. R/LHC showed normal coronaries and moderately elevated filling pressures with moderately depressed cardiac output. He had been previously seen by EP and approved for ICD with chronic low EF. [place] subcutaneous ICD was implanted 05/17/15.  He was diuresed with IV lasix 40 mg. Discharge weight 290 lb.  Admitted 4/11 through 01/16/16 with volume overload in the setting of missed medications. Diuresed with IV lasix and transitioned to torsemide 20 mg daily. Discharge weight was 293 pounds.   Admitted 9/4 - 11/11/04 with A/C systolic CHF in the setting of new afib. Started on IV lasix and amiodarone. Pt diuresed and converted to NSR without requiring DCCV. Started on Eliquis for [facility] [employer]. Continued on po amio on discharge with wean over several weeks.  He presents today for follow up.  Has started back working at [employer] 1-2 times a week. Denies SOB, palpitations, or tachycardia. Denies edema. No orthopnea. Denies lightheadedness or dizziness. Hasn't  needed any extra torsemide. Taking all medications as directed. He says his BPs run in 130-140s at home.   EKG today NSR 86 bpm  ECHO 01/16/2016 EF 20-25%  Grade III DD Echo 05/07/15.  EF 20-25%, grade II diastolic dysfunction, diffuse hypokinesis, moderate LV dilation, mild LVH, normal RV size with mildly decreased systolic function.   Echo in 2012 with EF 20-25%.  In 1/13 had nuclear study EF 45-50% suggestive of NICM.   [facility] 05/17/15 RA = 7 RV = 42/3/7 PA = 43/30 (39) PCW = 26 Fick cardiac output/index = 4.9/2.1 PVR = 2.7 WU SVR = 1651 FA sat = 95% PA sat = 65%, 65% Ao = 122/97 (109) LV = 110/31/33 1) Normal coronaries 2) Moderately elevated filling pressures with moderately depressed cardiac output   Past Medical History:  Diagnosis Date  . Anxiety   . Aspirin allergy   . Childhood asthma   . Chronic systolic CHF (congestive heart failure) ([place])    a. EF 20-25% in 2012. b. EF 45-50% in 10/2011 with nonischemic nuc - presumed NICM. c. 12/2014 Echo: Sev depressed LV fxn, sev dil LV, mild LVH, mild MR, sev dil LA, mildly reduced RV fxn.  . CKD (chronic kidney disease) stage 2, GFR 60-89 ml/min   . High cholesterol   . Hypertension   . Morbid obesity ([place])   . Nephrolithiasis   . OSA on CPAP   . Presumed NICM    a. 04/2014 Myoview: EF 26%, glob HK, sev glob HK, ? prior infarct;  b. Never cathed 2/2 CKD.  [name] Renal cell carcinoma (  [place])    a. s/p Rt robotic assisted partial converted to radical nephrectomy on 01/2013.  . Troponin level elevated    a. 04/2014, 12/2014: felt due to CHF.  . Type II diabetes mellitus ([place])    Past Surgical History:  Procedure Laterality Date  . APPENDECTOMY  07/2004  . CARDIAC CATHETERIZATION N/A 05/17/2015   Procedure: Right/Left Heart Cath and Coronary Angiography;  Surgeon: Jolaine Artist, MD;  Location: [place] CV [facility];  Service: Cardiovascular;  Laterality: N/A;  . EP IMPLANTABLE DEVICE N/A 05/17/2015   Procedure: SubQ ICD Implant;   Surgeon: Will Meredith Leeds, MD;  Location: [place] CV [facility];  Service: Cardiovascular;  Laterality: N/A;  . ROBOTIC ASSISTED LAPAROSCOPIC LYSIS OF ADHESION  01/13/2013   Procedure: ROBOTIC ASSISTED LAPAROSCOPIC LYSIS OF ADHESION EXTENSIVE;  Surgeon: Alexis Frock, MD;  [facility];  Service: Urology;;  . ROBOTIC ASSITED PARTIAL NEPHRECTOMY Right 01/13/2013   Procedure: ROBOTIC ASSITED PARTIAL NEPHRECTOMY CONVERTED TO ROBOTIC ASSISTED RIGHT RADICAL NEPHRECTOMY;  Surgeon: Alexis Frock, MD;  [facility];  Service: Urology;  Laterality: Right;    Allergies  Allergies  Allergen Reactions  . Aspirin Shortness Of Breath, Itching and Rash     Burning sensation (Patient reports he tolerates other NSAIDS)   . Bee Venom Hives and Swelling  . Lisinopril Cough  . Tomato Rash     Home Medications Current Outpatient Prescriptions  Medication Sig Dispense Refill  . allopurinol (ZYLOPRIM) 100 MG tablet Take 1 tablet (100 mg total) by mouth daily. 90 tablet 3  . amiodarone (PACERONE) 200 MG tablet Take 1 tablet (200 mg total) by mouth daily. 90 tablet 3  . apixaban (ELIQUIS) 5 MG TABS tablet Take 1 tablet (5 mg total) by mouth 2 (two) times daily. 60 tablet 10  . atorvastatin (LIPITOR) 80 MG tablet TAKE 1 TABLET (80 MG TOTAL) BY MOUTH EVERY EVENING. 90 tablet 3  . colchicine 0.6 MG tablet Take 1 tablet (0.6 mg total) by mouth 2 (two) times daily. 180 tablet 3  . diazepam (VALIUM) 5 MG tablet Take 1 tablet (5 mg total) by mouth every 8 (eight) hours as needed (Vertigo. Take if symptoms are not relieved by meclizine (Antivert ) alone.). 20 tablet 0  . hydrALAZINE (APRESOLINE) 50 MG tablet Take 1.5 tablets (75 mg total) by mouth 3 (three) times daily. 180 tablet 3  . insulin NPH-regular Human (NOVOLIN 70/30) (70-30) 100 UNIT/ML injection Inject 34 Units into the skin 2 (two) times daily with a meal. 60 mL 3  . isosorbide mononitrate (IMDUR) 30 MG 24 hr tablet Take 1 tablet (30 mg total) by  mouth daily. 90 tablet 3  . loratadine (CLARITIN) 10 MG tablet Take 1 tablet (10 mg total) by mouth daily. (Patient taking differently: Take 10 mg by mouth daily as needed for allergies. ) 30 tablet 3  . Magnesium Oxide 200 MG TABS Take 1 tablet (200 mg total) by mouth daily. (Patient taking differently: Take 100-200 mg by mouth daily. ) 90 tablet 2  . meclizine (ANTIVERT) 25 MG tablet Take 1 tablet (25 mg total) by mouth 3 (three) times daily as needed for dizziness (Take meclizine for spinning dizziness. You may also take Valium if your symptoms are not relieved by the meclizine alone.). 30 tablet 0  . sacubitril-valsartan (ENTRESTO) 49-51 MG Take 1 tablet by mouth 2 (two) times daily. 180 tablet 3  . spironolactone (ALDACTONE) 25 MG tablet Take 1 tablet (25 mg total) by mouth daily. [place]  tablet 3  . torsemide (DEMADEX) 20 MG tablet Take 1 tablet (20 mg total) by mouth daily. 90 tablet 3  . traMADol (ULTRAM) 50 MG tablet Take 1 tablet (50 mg total) by mouth every 6 (six) hours as needed. (Patient taking differently: Take 50 mg by mouth every 6 (six) hours as needed (for pain). ) 20 tablet 0   No current facility-administered medications for this encounter.    Review of systems complete and found to be negative unless listed in HPI.    Vitals:   07/22/17 1008  BP: (!) 147/88  Pulse: 82  SpO2: 99%  Weight: (!) 312 lb 2 oz (141.6 kg)   Wt Readings from Last 3 Encounters:  07/22/17 (!) 312 lb 2 oz (141.6 kg)  06/23/17 (!) 310 lb 6.4 oz (140.8 kg)  06/13/17 (!) 305 lb 11.2 oz (138.7 kg)   Physical Exam General: Well appearing. No resp difficulty. HEENT: Normal Neck: Supple. JVP 5-6. Carotids 2+ bilat; no bruits. No thyromegaly or nodule noted. Cor: PMI nondisplaced. RRR, No M/G/R noted Lungs: CTAB, normal effort. Abdomen: Soft, non-tender, non-distended, no HSM. No bruits or masses. +BS  Extremities: No cyanosis, clubbing, or rash. R and LLE no edema.  Neuro: Alert & orientedx3, cranial  nerves grossly intact. moves all 4 extremities w/o difficulty. Affect pleasant   Assessment & Plan 1.  Chronic systolic congestive heart failure: s/p [employer] subcutaneous ICD implant 05/17/15.  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 Echo 01/2016  EF 20-25%.  - Echo 06/02/17 EF 20-25% - NYHA II symptoms - Continue torsemide 20 mg daily.  Can take extra as needed - Continue Entresto 49/51 mg BID. Will be careful with titration in setting of solitary kidney.  - Increase hydralazine to 100 mg TID.  - Continue Imdur 30 mg daily.  - Continue spironolactone 25 mg daily  - Reinforced fluid restriction to < 2 L daily, sodium restriction to less than 2000 mg daily, and the importance of daily weights.    2. Paroxysmal Afib - New diagnosis this 06/2017.  - Now on Eliquis  - Continue amiodarone 200 mg daily. CMET/TSH today.  - Will need yearly eye exams. - Denies bleeding on eliquis.  2. Hypertension:  - Meds as above.  3. CKD stage III - BMET today. 4. OSA:  - Not using CPAP. CPAP machine broken. Have asked patient to follow up [facility] regarding CPAP equipment repair.  5. Tobacco use: - Quit 1 year ago. No change.  6. Gout:  - Continue allopurinol 200 daily for prophylaxis. No change to current plan. 7. DM2:  - Stable.  8.Renal Cell Carcinoma - S/P nephrectomy 2014.  9. Morbid Obesity -Discussed portion control.  No change.   Doing very well. Meds and labs as above. RTC 2 months. Sooner with symptoms.   Shirley Friar, PA-C  10:10 AM   Greater than 50% of the 25 minute visit was spent in counseling/coordination of care regarding disease state educations, salt/fluid restriction, sliding scale diuretics, and medication reconciliation.

## 2017-07-23 ENCOUNTER — Encounter: Payer: Self-pay | Admitting: Family Medicine

## 2017-07-23 ENCOUNTER — Ambulatory Visit (INDEPENDENT_AMBULATORY_CARE_PROVIDER_SITE_OTHER): Payer: Self-pay | Admitting: Family Medicine

## 2017-07-23 VITALS — BP 136/90 | HR 88 | Temp 98.6°F | Resp 14 | Ht 68.0 in | Wt 308.0 lb

## 2017-07-23 DIAGNOSIS — I482 Chronic atrial fibrillation, unspecified: Secondary | ICD-10-CM

## 2017-07-23 DIAGNOSIS — Z23 Encounter for immunization: Secondary | ICD-10-CM

## 2017-07-23 DIAGNOSIS — I5022 Chronic systolic (congestive) heart failure: Secondary | ICD-10-CM

## 2017-07-23 DIAGNOSIS — E1159 Type 2 diabetes mellitus with other circulatory complications: Secondary | ICD-10-CM

## 2017-07-23 DIAGNOSIS — Z794 Long term (current) use of insulin: Secondary | ICD-10-CM

## 2017-07-23 DIAGNOSIS — R7303 Prediabetes: Secondary | ICD-10-CM

## 2017-07-23 DIAGNOSIS — I10 Essential (primary) hypertension: Secondary | ICD-10-CM

## 2017-07-23 LAB — POCT GLYCOSYLATED HEMOGLOBIN (HGB A1C): Hemoglobin A1C: 6.3

## 2017-07-23 NOTE — Progress Notes (Signed)
Patient ID: Jeffrey [name], male    DOB: 09-15-1976, 41 y.o.   MRN: [id number]  PCP: Scot Jun, FNP  Chief Complaint  Patient presents with  . Establish Care    Subjective:  HPI Jeffrey [name] is a 41 y.o. male presents  to establish care and hospital follow-up. Problems significant for heart failure with ICD placement, morbid obesity, gout, DM, renal cell carcinoma status post nephrectomy, obstructive of sleep apnea, hypertension, and dyspnea.  On 06/08/2017, Nokia was hospitalized for atrial fibrillation, chronic systolic congestive heart failure with cardiogenic shock.  He was treated for active symptoms of chronic heart failure with IV Lasix. Last echocardiogram 05/2017 showed an EF 20-25% with severely reduced diastolic function.  For management of atrial fibrillation, he was  placed on IV amiodarone and IV heparin. He experienced a AKI with a peak creatinine to 2.3 which improved on day of discharge to 1.8.  He was discharged on anticoagulation therapy with Eliquis 5 mg twice daily.  Current CHA2DS2 VASc score of 3.  He reports today no active symptoms of shortness of breath, swelling, chest pain, dizziness. He is followed by cardiology at the [facility] Heart Failure clinic. Linken suffers from chronic gout and is managed by both allopurinol and colchicine as needed for flares.  He denies drinking beer, and reports limiting his seafood since being diagnosed with gout.  He continues to struggle with management of blood pressure, reports adherence to blood pressure medications and a low sodium diet. No longer smokes reports adherence now with CPAP machine as he suffers from obstructive sleep apnea. Social History   Social History  . Marital status: Single    Spouse name: N/A  . Number of children: N/A  . Years of education: N/A   Occupational History  . Not on file.   Social History Main Topics  . Smoking status: Former Smoker    Packs/day: 0.25    Years: 22.00    Quit date:  04/16/2015  . Smokeless tobacco: Never Used  . Alcohol use No     Comment: occ   . Drug use: Yes    Frequency: 14.0 times per week    Types: Marijuana     Comment: uses marijuana twice a day  . Sexual activity: Yes    Birth control/ protection: Condom   Other Topics Concern  . Not on file   Social History Narrative  . No narrative on file    Family History  Problem Relation Age of Onset  . Hypertension Mother   . Diabetes Maternal Aunt   . Heart attack Neg Hx   . Stroke Neg Hx    Review of Systems See HPI Patient Active Problem List   Diagnosis Date Noted  . PAF (paroxysmal atrial fibrillation) ([place])   . Dilated cardiomyopathy ([place])   . Pollen allergy 02/17/2017  . Dyslipidemia 02/17/2017  . Hematochezia 11/08/2015  . CKD (chronic kidney disease), stage III ([place]) 06/20/2015  . Cardiac device in situ, other   . Chronic systolic congestive heart failure ([place]) 03/11/2015  . Gouty arthritis 03/11/2015  . Chest pain 12/28/2014  . Aspirin allergy 04/08/2014  . Renal cell carcinoma ([place])   . Gout attack 11/10/2011  . Morbid obesity ([place]) 10/23/2011  . OSA (obstructive sleep apnea) 10/23/2011  . Type 2 diabetes mellitus with stage 3 chronic kidney disease, with long-term current use of insulin ([place]) 10/22/2011  . [place], MILD 12/25/2010  . HYPERTENSION, BENIGN ESSENTIAL 12/25/2010  Allergies  Allergen Reactions  . Aspirin Shortness Of Breath, Itching and Rash     Burning sensation (Patient reports he tolerates other NSAIDS)   . Bee Venom Hives and Swelling  . Lisinopril Cough  . Tomato Rash    Prior to Admission medications   Medication Sig Start Date End Date Taking? Authorizing Provider  allopurinol (ZYLOPRIM) 100 MG tablet Take 1 tablet (100 mg total) by mouth daily. 02/17/17  Yes Tresa Garter, MD  amiodarone (PACERONE) 200 MG tablet Take 1 tablet (200 mg total) by mouth daily. 06/28/17  Yes Shirley Friar, PA-C  apixaban (ELIQUIS) 5 MG  TABS tablet Take 1 tablet (5 mg total) by mouth 2 (two) times daily. 06/13/17  Yes Simmons, Brittainy M, PA-C  atorvastatin (LIPITOR) 80 MG tablet TAKE 1 TABLET (80 MG TOTAL) BY MOUTH EVERY EVENING. 02/17/17  Yes Jegede, Olugbemiga E, MD  colchicine 0.6 MG tablet Take 1 tablet (0.6 mg total) by mouth 2 (two) times daily. 02/17/17  Yes Tresa Garter, MD  diazepam (VALIUM) 5 MG tablet Take 1 tablet (5 mg total) by mouth every 8 (eight) hours as needed (Vertigo. Take if symptoms are not relieved by meclizine (Antivert ) alone.). 02/11/16  Yes [name], MD  hydrALAZINE (APRESOLINE) 100 MG tablet Take 1 tablet (100 mg total) by mouth 3 (three) times daily. 07/22/17  Yes Shirley Friar, PA-C  insulin NPH-regular Human (NOVOLIN 70/30) (70-30) 100 UNIT/ML injection Inject 34 Units into the skin 2 (two) times daily with a meal. 07/09/17  Yes Jegede, Olugbemiga E, MD  isosorbide mononitrate (IMDUR) 30 MG 24 hr tablet Take 1 tablet (30 mg total) by mouth daily. 06/23/17  Yes Shirley Friar, PA-C  loratadine (CLARITIN) 10 MG tablet Take 1 tablet (10 mg total) by mouth daily. Patient taking differently: Take 10 mg by mouth daily as needed for allergies.  02/17/17  Yes Tresa Garter, MD  Magnesium Oxide 200 MG TABS Take 1 tablet (200 mg total) by mouth daily. Patient taking differently: Take 100-200 mg by mouth daily.  01/19/17  Yes Shirley Friar, PA-C  meclizine (ANTIVERT) 25 MG tablet Take 1 tablet (25 mg total) by mouth 3 (three) times daily as needed for dizziness (Take meclizine for spinning dizziness. You may also take Valium if your symptoms are not relieved by the meclizine alone.). 02/11/16  Yes Pfeiffer, Jeannie Done, MD  sacubitril-valsartan (ENTRESTO) 49-51 MG Take 1 tablet by mouth 2 (two) times daily. 07/09/17  Yes Tresa Garter, MD  spironolactone (ALDACTONE) 25 MG tablet Take 1 tablet (25 mg total) by mouth daily. 06/23/17  Yes Shirley Friar, PA-C   torsemide (DEMADEX) 20 MG tablet Take 1 tablet (20 mg total) by mouth daily. 06/23/17  Yes Shirley Friar, PA-C  traMADol (ULTRAM) 50 MG tablet Take 1 tablet (50 mg total) by mouth every 6 (six) hours as needed. Patient not taking: Reported on 07/23/2017 06/05/17   Lajean Saver, MD    Past Medical, Surgical Family and Social History reviewed and updated.    Objective:   Today's Vitals   07/23/17 1326 07/23/17 1409  BP: (!) 140/96 136/90  Pulse: 88   Resp: 14   Temp: 98.6 F (37 C)   TempSrc: Oral   SpO2: 99%   Weight: (!) 308 lb (139.7 kg)   Height: 5\' 8"  (1.727 m)     Wt Readings from Last 3 Encounters:  07/23/17 (!) 308 lb (139.7 kg)  07/22/17 (!) 312 lb  2 oz (141.6 kg)  06/23/17 (!) 310 lb 6.4 oz (140.8 kg)    Physical Exam  Constitutional: He is oriented to person, place, and time. He appears well-developed and well-nourished.  HENT:  Head: Normocephalic and atraumatic.  Eyes: Pupils are equal, round, and reactive to light. Conjunctivae and EOM are normal.  Neck: Normal range of motion. Neck supple. No thyromegaly present.  Cardiovascular: Normal rate, normal heart sounds and intact distal pulses.   Pulmonary/Chest: Effort normal and breath sounds normal. No respiratory distress. He has no wheezes. He exhibits no tenderness.  Abdominal: Soft. Bowel sounds are normal.  Musculoskeletal: Normal range of motion. He exhibits no edema.  Lymphadenopathy:    He has no cervical adenopathy.  Neurological: He is alert and oriented to person, place, and time. He has normal reflexes.  Skin: Skin is warm and dry.  Psychiatric: He has a normal mood and affect. His behavior is normal. Judgment and thought content normal.   Assessment & Plan:  1. Chronic systolic congestive heart failure (HCC)-is followed by the heart failure clinic. Is tolerating Entresto and is compliant with torsemide. He will continue follow-up with heart failure clinic.  2. Chronic atrial fibrillation  (HCC)-stable, heart rate stable today, patient anticoagulated and followed by cardiology. 3. Prediabetes- POCT glycosylated hemoglobin (Hb A1C)-6.3 4. Benign hypertension, BP on recheck is stable today.  No changes in antihypertension therapy. 5. Need for immunization against influenza- Flu Vaccine QUAD 36+ mos IM  RTC: 3 months-repeat A1C, and hypertension management.   Patient provided a [place] financial assistance application.  Carroll Sage. Kenton Kingfisher, MSN, FNP-C [facility]  [address] [place], [place] [address] [phone]

## 2017-08-06 MED FILL — $ELIQUIS 5 MG TABLET: 5 | 30 days supply | Qty: 60 | Fill #0

## 2017-08-06 MED FILL — ?SPIRONOLACTONE 25 MG TABLE: 25 | 30 days supply | Qty: 30 | Fill #1

## 2017-08-06 MED FILL — hydrALAZINE HCL 100 MG TABS: 100 | 30 days supply | Qty: 90 | Fill #0

## 2017-08-06 MED FILL — ALLOPURINOL 100 MG TABLET: 100 | 30 days supply | Qty: 30 | Fill #1

## 2017-08-06 MED FILL — ISOSORBIDE MN ER 30 MG TAB: 30 | 30 days supply | Qty: 30 | Fill #1

## 2017-08-06 MED FILL — AMIODARONE HCL 200 MG TAB: 200 | 30 days supply | Qty: 30 | Fill #1

## 2017-08-06 MED FILL — ATORVASTATIN 80 MG TABLET: 80 | 30 days supply | Qty: 30 | Fill #2

## 2017-08-06 MED FILL — TORSEMIDE 20 MG TABLET: 20 | 30 days supply | Qty: 30 | Fill #1

## 2017-08-06 MED FILL — $novoLIN 70/30 100 UNITS/ML: (70-30) 100 | 29 days supply | Qty: 20 | Fill #0

## 2017-08-18 MED FILL — **ENTRESTO 49-51 MG TABLET: 49-51 | 14 days supply | Qty: 28 | Fill #0

## 2017-08-22 ENCOUNTER — Other Ambulatory Visit: Payer: Self-pay

## 2017-08-22 ENCOUNTER — Encounter (HOSPITAL_COMMUNITY): Payer: Self-pay

## 2017-08-22 ENCOUNTER — Emergency Department (HOSPITAL_COMMUNITY): Payer: Self-pay

## 2017-08-22 ENCOUNTER — Emergency Department (HOSPITAL_COMMUNITY)
Admission: EM | Admit: 2017-08-22 | Discharge: 2017-08-22 | Disposition: A | Discharge status: Home / Self Care | Payer: Self-pay | Attending: Physician Assistant | Admitting: Physician Assistant

## 2017-08-22 DIAGNOSIS — Y939 Activity, unspecified: Secondary | ICD-10-CM | POA: Insufficient documentation

## 2017-08-22 DIAGNOSIS — I130 Hypertensive heart and chronic kidney disease with heart failure and stage 1 through stage 4 chronic kidney disease, or unspecified chronic kidney disease: Secondary | ICD-10-CM | POA: Insufficient documentation

## 2017-08-22 DIAGNOSIS — S39012A Strain of muscle, fascia and tendon of lower back, initial encounter: Secondary | ICD-10-CM | POA: Insufficient documentation

## 2017-08-22 DIAGNOSIS — Z794 Long term (current) use of insulin: Secondary | ICD-10-CM | POA: Insufficient documentation

## 2017-08-22 DIAGNOSIS — Y92009 Unspecified place in unspecified non-institutional (private) residence as the place of occurrence of the external cause: Secondary | ICD-10-CM | POA: Insufficient documentation

## 2017-08-22 DIAGNOSIS — Y33XXXA Other specified events, undetermined intent, initial encounter: Secondary | ICD-10-CM | POA: Insufficient documentation

## 2017-08-22 DIAGNOSIS — N182 Chronic kidney disease, stage 2 (mild): Secondary | ICD-10-CM | POA: Insufficient documentation

## 2017-08-22 DIAGNOSIS — Z87891 Personal history of nicotine dependence: Secondary | ICD-10-CM | POA: Insufficient documentation

## 2017-08-22 DIAGNOSIS — J45909 Unspecified asthma, uncomplicated: Secondary | ICD-10-CM | POA: Insufficient documentation

## 2017-08-22 DIAGNOSIS — E7800 Pure hypercholesterolemia, unspecified: Secondary | ICD-10-CM | POA: Insufficient documentation

## 2017-08-22 DIAGNOSIS — Z85528 Personal history of other malignant neoplasm of kidney: Secondary | ICD-10-CM | POA: Insufficient documentation

## 2017-08-22 DIAGNOSIS — Z79899 Other long term (current) drug therapy: Secondary | ICD-10-CM | POA: Insufficient documentation

## 2017-08-22 DIAGNOSIS — I480 Paroxysmal atrial fibrillation: Secondary | ICD-10-CM | POA: Insufficient documentation

## 2017-08-22 DIAGNOSIS — Y998 Other external cause status: Secondary | ICD-10-CM | POA: Insufficient documentation

## 2017-08-22 DIAGNOSIS — E119 Type 2 diabetes mellitus without complications: Secondary | ICD-10-CM | POA: Insufficient documentation

## 2017-08-22 DIAGNOSIS — I420 Dilated cardiomyopathy: Secondary | ICD-10-CM | POA: Insufficient documentation

## 2017-08-22 DIAGNOSIS — Z7901 Long term (current) use of anticoagulants: Secondary | ICD-10-CM | POA: Insufficient documentation

## 2017-08-22 DIAGNOSIS — I5022 Chronic systolic (congestive) heart failure: Secondary | ICD-10-CM | POA: Insufficient documentation

## 2017-08-22 LAB — URINALYSIS, ROUTINE W REFLEX MICROSCOPIC
BILIRUBIN URINE: NEGATIVE
Bacteria, UA: NONE SEEN
GLUCOSE, UA: NEGATIVE mg/dL
HGB URINE DIPSTICK: NEGATIVE
Ketones, ur: NEGATIVE mg/dL
Leukocytes, UA: NEGATIVE
NITRITE: NEGATIVE
PROTEIN: 30 mg/dL — AB
Specific Gravity, Urine: 1.017 (ref 1.005–1.030)
pH: 7 (ref 5.0–8.0)

## 2017-08-22 LAB — CBC
HEMATOCRIT: 41.4 % (ref 39.0–52.0)
Hemoglobin: 13.6 g/dL (ref 13.0–17.0)
MCH: 25.7 pg — ABNORMAL LOW (ref 26.0–34.0)
MCHC: 32.9 g/dL (ref 30.0–36.0)
MCV: 78.1 fL (ref 78.0–100.0)
Platelets: 230 10*3/uL (ref 150–400)
RBC: 5.3 MIL/uL (ref 4.22–5.81)
RDW: 16.9 % — AB (ref 11.5–15.5)
WBC: 10.5 10*3/uL (ref 4.0–10.5)

## 2017-08-22 LAB — COMPREHENSIVE METABOLIC PANEL
ALT: 38 U/L (ref 17–63)
ANION GAP: 6 (ref 5–15)
AST: 30 U/L (ref 15–41)
Albumin: 3.5 g/dL (ref 3.5–5.0)
Alkaline Phosphatase: 54 U/L (ref 38–126)
BILIRUBIN TOTAL: 0.6 mg/dL (ref 0.3–1.2)
BUN: 19 mg/dL (ref 6–20)
CO2: 21 mmol/L — ABNORMAL LOW (ref 22–32)
Calcium: 9.4 mg/dL (ref 8.9–10.3)
Chloride: 110 mmol/L (ref 101–111)
Creatinine, Ser: 1.48 mg/dL — ABNORMAL HIGH (ref 0.61–1.24)
GFR, EST NON AFRICAN AMERICAN: 57 mL/min — AB (ref >60)
Glucose, Bld: 94 mg/dL (ref 65–99)
POTASSIUM: 4.5 mmol/L (ref 3.5–5.1)
Sodium: 137 mmol/L (ref 135–145)
TOTAL PROTEIN: 7.1 g/dL (ref 6.5–8.1)

## 2017-08-22 MED ORDER — IOPAMIDOL (ISOVUE-300) INJECTION 61%
INTRAVENOUS | Status: AC
  Filled 2017-08-22: qty 75

## 2017-08-22 MED ORDER — OXYCODONE-ACETAMINOPHEN 5-325 MG PO TABS: 1.0000 | ORAL_TABLET | ORAL | 0 refills | Status: DC | PRN

## 2017-08-22 MED ORDER — CYCLOBENZAPRINE HCL 5 MG PO TABS: 5.0000 mg | ORAL_TABLET | Freq: Three times a day (TID) | ORAL | 0 refills | Status: DC | PRN

## 2017-08-22 NOTE — ED Notes (Signed)
Pt frustrated that nobody has spoken to him about his results. I informed the pt of his plan of care. Pt states he will stay until his paperwork is done.

## 2017-08-22 NOTE — ED Provider Notes (Signed)
[facility] EMERGENCY DEPARTMENT Provider Note   CSN: [id number] Arrival date & time: 08/22/17  1438     History   Chief Complaint Chief Complaint  Patient presents with  . Back Pain   HPI  Jeffrey [name] is a 41 year old male presenting with right-sided lower back pain since Thursday.  He states he was laying on the couch and coughed when he felt sudden onset of right-sided lower back pain.  Pain has been constant but worse with cough/sneezing/straining.  He reports he sneezed again last night and experienced the same excruciating pain.  He has not lifted any heavy weights or objects recently.  Pain is 10/10 with radiation around to his right flank and RLQ/RUQ.  He denies falls and injury to the site.  He denies weakness, numbness, tingling down the leg.  He states he had a similar episode several years ago and had back pain following a cough.  At that time he was told it was a muscle strain.  He reports taking Aleve and Extra Strength Tylenol without relief.  He has also tried tramadol, icing and heat application which did not help.  He reports he was found to have renal cell carcinoma several years ago in his right kidney has been removed.    Denies bowel or bladder incontinence.  Denies fever, chills, nausea, vomiting, diarrhea.  Denies chest pain, shortness of breath, leg swelling.  Past Medical History:  Diagnosis Date  . Anxiety   . Aspirin allergy   . Childhood asthma   . Chronic systolic CHF (congestive heart failure) ([place])    a. EF 20-25% in 2012. b. EF 45-50% in 10/2011 with nonischemic nuc - presumed NICM. c. 12/2014 Echo: Sev depressed LV fxn, sev dil LV, mild LVH, mild MR, sev dil LA, mildly reduced RV fxn.  . CKD (chronic kidney disease) stage 2, GFR 60-89 ml/min   . High cholesterol   . Hypertension   . Morbid obesity ([place])   . Nephrolithiasis   . OSA on CPAP   . Presumed NICM    a. 04/2014 Myoview: EF 26%, glob HK, sev glob HK, ? prior infarct;  b. Never  cathed 2/2 CKD.  [name] Renal cell carcinoma ([place])    a. s/p Rt robotic assisted partial converted to radical nephrectomy on 01/2013.  . Troponin level elevated    a. 04/2014, 12/2014: felt due to CHF.  . Type II diabetes mellitus [facility])    Patient Active Problem List   Diagnosis Date Noted  . PAF (paroxysmal atrial fibrillation) ([place])   . Dilated cardiomyopathy ([place])   . Pollen allergy 02/17/2017  . Dyslipidemia 02/17/2017  . Hematochezia 11/08/2015  . CKD (chronic kidney disease), stage III ([place]) 06/20/2015  . Cardiac device in situ, other   . Chronic systolic congestive heart failure ([place]) 03/11/2015  . Gouty arthritis 03/11/2015  . Chest pain 12/28/2014  . Aspirin allergy 04/08/2014  . Renal cell carcinoma ([place])   . Gout attack 11/10/2011  . Morbid obesity ([place]) 10/23/2011  . OSA (obstructive sleep apnea) 10/23/2011  . Type 2 diabetes mellitus with stage 3 chronic kidney disease, with long-term current use of insulin ([place]) 10/22/2011  . [place], MILD 12/25/2010  . HYPERTENSION, BENIGN ESSENTIAL 12/25/2010   Past Surgical History:  Procedure Laterality Date  . APPENDECTOMY  07/2004  . Right/Left Heart Cath and Coronary Angiography N/A 05/17/2015   Performed by Jolaine Artist, MD at [facility]  . ROBOTIC ASSISTED LAPAROSCOPIC LYSIS  OF ADHESION EXTENSIVE  01/13/2013   Performed by Alexis Frock, MD at [facility]  . ROBOTIC ASSITED PARTIAL NEPHRECTOMY CONVERTED TO ROBOTIC ASSISTED RIGHT RADICAL NEPHRECTOMY Right 01/13/2013   Performed by Alexis Frock, MD at [facility]  . SubQ ICD Implant N/A 05/17/2015   Performed by Constance Haw, MD at [facility]    Home Medications    Prior to Admission medications   Medication Sig Start Date End Date Taking? Authorizing Provider  allopurinol (ZYLOPRIM) 100 MG tablet Take 1 tablet (100 mg total) by mouth daily. 02/17/17   Tresa Garter, MD  amiodarone (PACERONE) 200 MG tablet Take 1 tablet (200 mg total) by  mouth daily. 06/28/17   Shirley Friar, PA-C  apixaban (ELIQUIS) 5 MG TABS tablet Take 1 tablet (5 mg total) by mouth 2 (two) times daily. 06/13/17   Lyda Jester M, PA-C  atorvastatin (LIPITOR) 80 MG tablet TAKE 1 TABLET (80 MG TOTAL) BY MOUTH EVERY EVENING. 02/17/17   Tresa Garter, MD  colchicine 0.6 MG tablet Take 1 tablet (0.6 mg total) by mouth 2 (two) times daily. 02/17/17   Tresa Garter, MD  hydrALAZINE (APRESOLINE) 100 MG tablet Take 1 tablet (100 mg total) by mouth 3 (three) times daily. 07/22/17   Shirley Friar, PA-C  insulin NPH-regular Human (NOVOLIN 70/30) (70-30) 100 UNIT/ML injection Inject 34 Units into the skin 2 (two) times daily with a meal. 07/09/17   Jegede, [name], MD  isosorbide mononitrate (IMDUR) 30 MG 24 hr tablet Take 1 tablet (30 mg total) by mouth daily. 06/23/17   Shirley Friar, PA-C  loratadine (CLARITIN) 10 MG tablet Take 1 tablet (10 mg total) by mouth daily. Patient taking differently: Take 10 mg by mouth daily as needed for allergies.  02/17/17   Tresa Garter, MD  Magnesium Oxide 200 MG TABS Take 1 tablet (200 mg total) by mouth daily. Patient taking differently: Take 100-200 mg by mouth daily.  01/19/17   Shirley Friar, PA-C  meclizine (ANTIVERT) 25 MG tablet Take 1 tablet (25 mg total) by mouth 3 (three) times daily as needed for dizziness (Take meclizine for spinning dizziness. You may also take Valium if your symptoms are not relieved by the meclizine alone.). 02/11/16   [name], MD  sacubitril-valsartan (ENTRESTO) 49-51 MG Take 1 tablet by mouth 2 (two) times daily. 07/09/17   Tresa Garter, MD  spironolactone (ALDACTONE) 25 MG tablet Take 1 tablet (25 mg total) by mouth daily. 06/23/17   Shirley Friar, PA-C  torsemide (DEMADEX) 20 MG tablet Take 1 tablet (20 mg total) by mouth daily. 06/23/17   Shirley Friar, PA-C  traMADol (ULTRAM) 50 MG tablet Take 1 tablet (50 mg  total) by mouth every 6 (six) hours as needed. Patient not taking: Reported on 07/23/2017 06/05/17   Lajean Saver, MD   Family History Family History  Problem Relation Age of Onset  . Hypertension Mother   . Diabetes Maternal Aunt   . Heart attack Neg Hx   . Stroke Neg Hx    Social History Social History   Tobacco Use  . Smoking status: Former Smoker    Packs/day: 0.25    Years: 22.00    Pack years: 5.50    Last attempt to quit: 04/16/2015    Years since quitting: 2.3  . Smokeless tobacco: Never Used  Substance Use Topics  . Alcohol use: No    Comment: occ   .  Drug use: Yes    Frequency: 14.0 times per week    Types: Marijuana    Comment: uses marijuana twice a day   Allergies   Aspirin; Bee venom; Lisinopril; and Tomato  Review of Systems Review of Systems  Constitutional: Negative for chills and fever.  Respiratory: Negative for cough, chest tightness, shortness of breath and wheezing.   Cardiovascular: Negative for chest pain and leg swelling.  Gastrointestinal: Positive for abdominal pain. Negative for diarrhea, nausea and vomiting.  Genitourinary: Negative for difficulty urinating and dysuria.  Musculoskeletal: Positive for back pain. Negative for neck pain.  Skin: Negative for rash.  Neurological: Negative for weakness.   Physical Exam Updated Vital Signs BP (!) 128/93   Pulse 72   Temp 98.4 F (36.9 C) (Oral)   Resp 16   Ht 5\' 7"  (1.702 m)   Wt 133.8 kg (295 lb)   SpO2 100%   BMI 46.20 kg/m   Physical Exam Gen-41 year old male, NAD, in mild distress Skin -warm, dry, no rash HEENT- NCAT, EOMI, moist mucous membranes Neck - supple, nontender Chest -CTA B, work of breathing is normal Heart -RRR no MRG Abdomen - soft, TTP RUQ/RLQ, no rebound or guarding present, +bs  Musculoskeletal - no edema, TTP over R paraspinal lumbar muscles  Neuro -alert, oriented x3, motor strength 5/5 bilaterally in upper and lower extr, negative straight test, sensation is  normal   ED Treatments / Results  Labs (all labs ordered are listed, but only abnormal results are displayed) Labs Reviewed  COMPREHENSIVE METABOLIC PANEL - Abnormal; Notable for the following components:      Result Value   CO2 21 (*)    Creatinine, Ser 1.48 (*)    GFR calc non Af Amer 57 (*)    All other components within normal limits  URINALYSIS, ROUTINE W REFLEX MICROSCOPIC - Abnormal; Notable for the following components:   Protein, ur 30 (*)    Squamous Epithelial / LPF 0-5 (*)    All other components within normal limits  CBC - Abnormal; Notable for the following components:   MCH 25.7 (*)    RDW 16.9 (*)    All other components within normal limits   EKG  EKG Interpretation None      Radiology Ct Abdomen Pelvis Wo Contrast  Result Date: 08/22/2017 CLINICAL DATA:  41 year old male with abdominal pain. EXAM: CT ABDOMEN AND PELVIS WITHOUT CONTRAST TECHNIQUE: Multidetector CT imaging of the abdomen and pelvis was performed following the standard protocol without IV contrast. COMPARISON:  Abdominal CT dated 01/21/2013 FINDINGS: Evaluation of this exam is limited in the absence of intravenous contrast. Lower chest: There are mild bibasilar atelectatic changes. The visualized lung bases are otherwise clear. There is mild cardiomegaly. No intra-abdominal free air or free fluid. Hepatobiliary: No focal liver abnormality is seen. No gallstones, gallbladder wall thickening, or biliary dilatation. Pancreas: Unremarkable. No pancreatic ductal dilatation or surrounding inflammatory changes. Spleen: Normal in size without focal abnormality. Adrenals/Urinary Tract: There is a solitary left kidney. Postsurgical changes of right nephrectomy. The left kidney is unremarkable. The left ureter and urinary bladder are unremarkable. Stomach/Bowel: There is a 2.5 cm duodenal diverticulum without active inflammatory changes. There is loose stool throughout the colon. There is no bowel obstruction or  active inflammation. Appendectomy. Vascular/Lymphatic: The abdominal aorta and IVC are grossly unremarkable on this noncontrast CT. No portal venous gas. There is no adenopathy. Reproductive: The prostate and seminal vesicles are grossly unremarkable. No pelvic mass. Other: Small fat  containing umbilical hernia. Musculoskeletal: A generator pack is seen in the subcutaneous soft tissues of the left flank. No acute osseous pathology. IMPRESSION: 1. No acute intra-abdominal or pelvic pathology. No bowel obstruction or active inflammation. 2. Loose stool within the colon may represent diarrheal state. Clinical correlation is recommended. 3. Prior right nephrectomy. There is no hydronephrosis or nephrolithiasis on the left. Electronically Signed   By: Anner Crete M.D.   On: 08/22/2017 22:19    Procedures Procedures (including critical care time)  Medications Ordered in ED Medications  iopamidol (ISOVUE-300) 61 % injection (not administered)   Initial Impression / Assessment and Plan / ED Course  I have reviewed the triage vital signs and the nursing notes.  Pertinent labs & imaging results that were available during my care of the patient were reviewed by me and considered in my medical decision making (see chart for details).  41 year old male presenting with R-sided lower back pain x 4 days. Vitals stable on arrival and patient appears comfortable.  Physical exam notable for mild tenderness over R paraspinal lumbar muscles and RUQ/RLQ however without signs of acute abdomen.   Kidney stone considered however patient does not have a right kidney due to removal after diagnosis of RCC.  Denies pain with urination and UA does not appear infectious with negative leuks and nitrite.   CBC and CMET within normal range.  CT A/P with contrast ordered to further evaluate and was negative for acute intra-abdominal pathology.  Symptoms most consistent with muscle spasm and patient sent home with Rx for Flexeril  and short course of pain medication  -Return precautions discussed and patient expressed good understanding   Final Clinical Impressions(s) / ED Diagnoses   Final diagnoses:  None   ED Discharge Orders    None     Lovenia Kim, MD [place], PGY-2    Lovenia Kim, MD 08/22/17 2237    Macarthur Critchley, MD 08/26/17 [id number]

## 2017-08-22 NOTE — ED Triage Notes (Signed)
Onset 3 days ago pt coughed and felt sharp pain in right flank and right side of abd.  Pt sneezed yesterday and pain worsened.  No urinary problems or fever.

## 2017-08-22 NOTE — Discharge Instructions (Signed)
You were seen in the ER for back and right-sided abdominal pain.  Your symptoms are most likely due to a pulled muscle.  We obtained imaging of your  abdomen to rule out more dangerous causes which were normal.   I am prescribing some Flexeril and a short course of pain medication to help you with your symptoms.  Additionally you can apply heating pads to the area to provide some relief.  I would like for you to follow-up with your PCP to ensure your pain has resolved.  If you develop new or worsening symptoms he will need to be reevaluated.

## 2017-08-25 MED FILL — CYCLOBENZAPRINE 5 MG TABLET: 5 | 5 days supply | Qty: 15 | Fill #0

## 2017-08-31 ENCOUNTER — Encounter (HOSPITAL_COMMUNITY): Payer: Self-pay | Admitting: Internal Medicine

## 2017-09-08 ENCOUNTER — Other Ambulatory Visit: Payer: Self-pay

## 2017-09-08 ENCOUNTER — Ambulatory Visit (HOSPITAL_COMMUNITY)
Admission: RE | Admit: 2017-09-08 | Discharge: 2017-09-08 | Disposition: A | Discharge status: Home / Self Care | Payer: Self-pay | Source: Ambulatory Visit | Attending: Internal Medicine | Admitting: Internal Medicine

## 2017-09-08 ENCOUNTER — Encounter (HOSPITAL_COMMUNITY): Payer: Self-pay | Admitting: Internal Medicine

## 2017-09-08 VITALS — BP 130/110 | HR 86 | Wt 325.4 lb

## 2017-09-08 DIAGNOSIS — Z7902 Long term (current) use of antithrombotics/antiplatelets: Secondary | ICD-10-CM | POA: Insufficient documentation

## 2017-09-08 DIAGNOSIS — G4733 Obstructive sleep apnea (adult) (pediatric): Secondary | ICD-10-CM | POA: Insufficient documentation

## 2017-09-08 DIAGNOSIS — M109 Gout, unspecified: Secondary | ICD-10-CM | POA: Insufficient documentation

## 2017-09-08 DIAGNOSIS — Z87442 Personal history of urinary calculi: Secondary | ICD-10-CM | POA: Insufficient documentation

## 2017-09-08 DIAGNOSIS — Z79899 Other long term (current) drug therapy: Secondary | ICD-10-CM | POA: Insufficient documentation

## 2017-09-08 DIAGNOSIS — Z9889 Other specified postprocedural states: Secondary | ICD-10-CM | POA: Insufficient documentation

## 2017-09-08 DIAGNOSIS — I480 Paroxysmal atrial fibrillation: Secondary | ICD-10-CM | POA: Insufficient documentation

## 2017-09-08 DIAGNOSIS — E7800 Pure hypercholesterolemia, unspecified: Secondary | ICD-10-CM | POA: Insufficient documentation

## 2017-09-08 DIAGNOSIS — Z91018 Allergy to other foods: Secondary | ICD-10-CM | POA: Insufficient documentation

## 2017-09-08 DIAGNOSIS — Z888 Allergy status to other drugs, medicaments and biological substances status: Secondary | ICD-10-CM | POA: Insufficient documentation

## 2017-09-08 DIAGNOSIS — Z886 Allergy status to analgesic agent status: Secondary | ICD-10-CM | POA: Insufficient documentation

## 2017-09-08 DIAGNOSIS — I130 Hypertensive heart and chronic kidney disease with heart failure and stage 1 through stage 4 chronic kidney disease, or unspecified chronic kidney disease: Secondary | ICD-10-CM | POA: Insufficient documentation

## 2017-09-08 DIAGNOSIS — I5022 Chronic systolic (congestive) heart failure: Secondary | ICD-10-CM | POA: Insufficient documentation

## 2017-09-08 DIAGNOSIS — Z905 Acquired absence of kidney: Secondary | ICD-10-CM | POA: Insufficient documentation

## 2017-09-08 DIAGNOSIS — Z6841 Body Mass Index (BMI) 40.0 and over, adult: Secondary | ICD-10-CM | POA: Insufficient documentation

## 2017-09-08 DIAGNOSIS — Z794 Long term (current) use of insulin: Secondary | ICD-10-CM | POA: Insufficient documentation

## 2017-09-08 DIAGNOSIS — Z955 Presence of coronary angioplasty implant and graft: Secondary | ICD-10-CM | POA: Insufficient documentation

## 2017-09-08 DIAGNOSIS — Z85528 Personal history of other malignant neoplasm of kidney: Secondary | ICD-10-CM | POA: Insufficient documentation

## 2017-09-08 DIAGNOSIS — N183 Chronic kidney disease, stage 3 unspecified: Secondary | ICD-10-CM

## 2017-09-08 DIAGNOSIS — J45909 Unspecified asthma, uncomplicated: Secondary | ICD-10-CM | POA: Insufficient documentation

## 2017-09-08 DIAGNOSIS — I10 Essential (primary) hypertension: Secondary | ICD-10-CM

## 2017-09-08 DIAGNOSIS — E1122 Type 2 diabetes mellitus with diabetic chronic kidney disease: Secondary | ICD-10-CM | POA: Insufficient documentation

## 2017-09-08 DIAGNOSIS — I429 Cardiomyopathy, unspecified: Secondary | ICD-10-CM | POA: Insufficient documentation

## 2017-09-08 DIAGNOSIS — Z91030 Bee allergy status: Secondary | ICD-10-CM | POA: Insufficient documentation

## 2017-09-08 MED ORDER — SACUBITRIL-VALSARTAN 97-103 MG PO TABS: 1.0000 | ORAL_TABLET | Freq: Two times a day (BID) | ORAL | 3 refills | Status: DC

## 2017-09-08 NOTE — Progress Notes (Signed)
Patient ID: Jeffrey [name], male   DOB: 1976/05/19, 41 y.o.   MRN: [id number]      Advanced Heart Failure Clinic Note   Patient Name: Jeffrey [name] Date of Encounter: 09/08/2017  Primary Care Provider:  Scot Jun, FNP Primary Cardiologist:  Lizbeth Bark, MD  Primary HF Cardiologist: Tannia Contino  History:  Jeffrey [name] is a 41 y.o. 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    Admitted to [facility] in 3/16 secondary to atypical chest pain, dyspnea, and mild troponin elevation. Echo during hospitalization shows "severely reduced LV function" - EF not quantified due to poor windows. He was diuresed and was down about 7 pounds at discharge, at 289 on the hospital scale.   Admitted 05/14/15 with CP at rest. CXR looked improved from previous. CP resolved spontaneously after hospitalization and troponin was flat. R/LHC showed normal coronaries and moderately elevated filling pressures with moderately depressed cardiac output. He had been previously seen by EP and approved for ICD with chronic low EF. [place] subcutaneous ICD was implanted 05/17/15.  He was diuresed with IV lasix 40 mg. Discharge weight 290 lb.  Admitted 4/11 through 01/16/16 with volume overload in the setting of missed medications. Diuresed with IV lasix and transitioned to torsemide 20 mg daily. Discharge weight was 293 pounds.   Admitted 9/4 - 7/0/35 with A/C systolic CHF in the setting of new afib. Started on IV lasix and amiodarone. Pt diuresed and converted to NSR without requiring DCCV. Started on Eliquis for [facility]. Continued on po amio on discharge with wean over several weeks.  He presents today for regular follow up. At last visit hydralazine increased. Weight up 13 lbs from last visit. Weight 320-322 lbs at home. He pulled his back out last month so hasn't been very active for the past  3 1/2 weeks. Has been out of work at home. BP in 120s at home. Taking all medications as diuretics.  Ate just before coming in, had an Egg and Cheese biscuit from [employer]. Denies lightheadedness or dizziness. Denies DOE.   ECHO 01/16/2016 EF 20-25%  Grade III DD Echo 05/07/15.  EF 20-25%, grade II diastolic dysfunction, diffuse hypokinesis, moderate LV dilation, mild LVH, normal RV size with mildly decreased systolic function.   Echo in 2012 with EF 20-25%.  In 1/13 had nuclear study EF 45-50% suggestive of NICM.   [facility] 05/17/15 RA = 7 RV = 42/3/7 PA = 43/30 (39) PCW = 26 Fick cardiac output/index = 4.9/2.1 PVR = 2.7 WU SVR = 1651 FA sat = 95% PA sat = 65%, 65% Ao = 122/97 (109) LV = 110/31/33 1) Normal coronaries 2) Moderately elevated filling pressures with moderately depressed cardiac output   Past Medical History:  Diagnosis Date  . Anxiety   . Aspirin allergy   . Childhood asthma   . Chronic systolic CHF (congestive heart failure) ([place])    a. EF 20-25% in 2012. b. EF 45-50% in 10/2011 with nonischemic nuc - presumed NICM. c. 12/2014 Echo: Sev depressed LV fxn, sev dil LV, mild LVH, mild MR, sev dil LA, mildly reduced RV fxn.  . CKD (chronic kidney disease) stage 2, GFR 60-89 ml/min   . High cholesterol   . Hypertension   . Morbid obesity ([place])   . Nephrolithiasis   . OSA on CPAP   . Presumed NICM  a. 04/2014 Myoview: EF 26%, glob HK, sev glob HK, ? prior infarct;  b. Never cathed 2/2 CKD.  [name] Renal cell carcinoma ([place])    a. s/p Rt robotic assisted partial converted to radical nephrectomy on 01/2013.  . Troponin level elevated    a. 04/2014, 12/2014: felt due to CHF.  . Type II diabetes mellitus ([place])    Past Surgical History:  Procedure Laterality Date  . APPENDECTOMY  07/2004  . CARDIAC CATHETERIZATION N/A 05/17/2015   Procedure: Right/Left Heart Cath and Coronary Angiography;  Surgeon: Jolaine Artist, MD;  Location: [place] [facility];  Service: Cardiovascular;   Laterality: N/A;  . EP IMPLANTABLE DEVICE N/A 05/17/2015   Procedure: SubQ ICD Implant;  Surgeon: Will Meredith Leeds, MD;  Location: [place] [facility];  Service: Cardiovascular;  Laterality: N/A;  . ROBOTIC ASSISTED LAPAROSCOPIC LYSIS OF ADHESION  01/13/2013   Procedure: ROBOTIC ASSISTED LAPAROSCOPIC LYSIS OF ADHESION EXTENSIVE;  Surgeon: Alexis Frock, MD;  Location: [facility];  Service: Urology;;  . ROBOTIC ASSITED PARTIAL NEPHRECTOMY Right 01/13/2013   Procedure: ROBOTIC ASSITED PARTIAL NEPHRECTOMY CONVERTED TO ROBOTIC ASSISTED RIGHT RADICAL NEPHRECTOMY;  Surgeon: Alexis Frock, MD;  Location: [facility];  Service: Urology;  Laterality: Right;    Allergies  Allergies  Allergen Reactions  . Aspirin Shortness Of Breath, Itching and Rash     Burning sensation (Patient reports he tolerates other NSAIDS)   . Bee Venom Hives and Swelling  . Lisinopril Cough  . Tomato Rash     Home Medications Current Outpatient Medications  Medication Sig Dispense Refill  . allopurinol (ZYLOPRIM) 100 MG tablet Take 1 tablet (100 mg total) by mouth daily. 90 tablet 3  . amiodarone (PACERONE) 200 MG tablet Take 1 tablet (200 mg total) by mouth daily. 90 tablet 3  . apixaban (ELIQUIS) 5 MG TABS tablet Take 1 tablet (5 mg total) by mouth 2 (two) times daily. 60 tablet 10  . atorvastatin (LIPITOR) 80 MG tablet TAKE 1 TABLET (80 MG TOTAL) BY MOUTH EVERY EVENING. 90 tablet 3  . colchicine 0.6 MG tablet Take 1 tablet (0.6 mg total) by mouth 2 (two) times daily. 180 tablet 3  . cyclobenzaprine (FLEXERIL) 5 MG tablet Take 1 tablet (5 mg total) 3 (three) times daily as needed by mouth for muscle spasms. 15 tablet 0  . hydrALAZINE (APRESOLINE) 100 MG tablet Take 1 tablet (100 mg total) by mouth 3 (three) times daily. 90 tablet 6  . insulin NPH-regular Human (NOVOLIN 70/30) (70-30) 100 UNIT/ML injection Inject 34 Units into the skin 2 (two) times daily with a meal. 60 mL 3  . isosorbide mononitrate (IMDUR) 30 MG 24 hr  tablet Take 1 tablet (30 mg total) by mouth daily. 90 tablet 3  . loratadine (CLARITIN) 10 MG tablet Take 1 tablet (10 mg total) by mouth daily. (Patient taking differently: Take 10 mg by mouth daily as needed for allergies. ) 30 tablet 3  . Magnesium Oxide 200 MG TABS Take 1 tablet (200 mg total) by mouth daily. (Patient taking differently: Take 100 mg by mouth daily. ) 90 tablet 2  . meclizine (ANTIVERT) 25 MG tablet Take 1 tablet (25 mg total) by mouth 3 (three) times daily as needed for dizziness (Take meclizine for spinning dizziness. You may also take Valium if your symptoms are not relieved by the meclizine alone.). 30 tablet 0  . oxyCODONE-acetaminophen (ROXICET) 5-325 MG tablet Take 1 tablet every 4 (four) hours as needed by mouth for severe  pain. 7 tablet 0  . sacubitril-valsartan (ENTRESTO) 49-51 MG Take 1 tablet by mouth 2 (two) times daily. 180 tablet 3  . spironolactone (ALDACTONE) 25 MG tablet Take 1 tablet (25 mg total) by mouth daily. 90 tablet 3  . torsemide (DEMADEX) 20 MG tablet Take 1 tablet (20 mg total) by mouth daily. 90 tablet 3  . traMADol (ULTRAM) 50 MG tablet Take 1 tablet (50 mg total) by mouth every 6 (six) hours as needed. 20 tablet 0   No current facility-administered medications for this encounter.    Review of systems complete and found to be negative unless listed in HPI.    Vitals:   09/08/17 1101  BP: (!) 130/110  Pulse: 86  SpO2: 97%  Weight: (!) 325 lb 6.4 oz (147.6 kg)   Wt Readings from Last 3 Encounters:  09/08/17 (!) 325 lb 6.4 oz (147.6 kg)  08/22/17 295 lb (133.8 kg)  07/23/17 (!) 308 lb (139.7 kg)   Physical Exam General: Well appearing. No resp difficulty. HEENT: Normal  anicteric Neck: Supple. JVP 5-6. Carotids 2+ bilat; no bruits. No thyromegaly or nodule noted. Cor: PMI laterally displaced. RRR, No M/G/R noted Lungs: CTAB, normal effort. Abdomen: Obese Soft, non-tender, non-distended, no HSM. No bruits or masses. +BS  Extremities:  No cyanosis, clubbing, or rash. R and LLE no edema.  Neuro: Alert & orientedx3, cranial nerves grossly intact. moves all 4 extremities w/o difficulty. Affect pleasant   Assessment & Plan 1.  Chronic systolic congestive heart failure: s/p [employer] subcutaneous ICD implant 05/17/15.  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 Echo 01/2016  EF 20-25%.  - Echo 06/02/17 EF 20-25% - NYHA II symptoms chronically.  - Continue torsemide 20 mg daily.  Has not needed any extra.  - Increase Entresto 49/51 mg BID. Will be careful with titration in setting of solitary kidney.  BMET next week. - Continue hydralazine 100 mg TID.  - Continue Imdur 30 mg daily.  - Continue spironolactone 25 mg daily  - Reinforced fluid restriction to < 2 L daily, sodium restriction to less than 2000 mg daily, and the importance of daily weights.   2. Paroxysmal Afib - New diagnosis this 06/2017.  - Now on Eliquis  - Continue amiodarone 200 mg daily. CMET/TSH today.  - Will need yearly eye exams. - Denies bleeding on eliquis.  2. Hypertension:  - Meds as above.  3. CKD stage III - BMET today. Creatinine 1.48 08/22/17 4. OSA:  - Not using CPAP. CPAP machine broken. Have again asked patient to follow up [facility] regarding CPAP equipment repair.  5. Tobacco use: - Quit 1 year ago. No change.  6. Gout:  - Continue allopurinol 200 daily for prophylaxis. No change.  7. DM2:  - Stable. Per PCP.  8.Renal Cell Carcinoma - S/P nephrectomy 2014.  - Follow renal function carefully in setting of solitary kidney.  9. Morbid Obesity -Discussed portion control.  No change.   Meds and labs as above. RTC 3-4 months.   Shirley Friar, PA-C  11:07 AM    Patient seen and examined with the above-signed Advanced Practice Provider and/or Housestaff. I personally reviewed laboratory data, imaging studies and relevant notes. I independently examined the patient and formulated the important aspects of the plan. I have edited the  note to reflect any of my changes or salient points. I have personally discussed the plan with the patient and/or family.  Doing well. NYHA II. Volume status looks good. Will increase Entresto  to full-dose 97/103 bid. Discussed need for ongoing compliance with HF regimen and dietary restriction.   Glori Bickers, MD  10:40 PM

## 2017-09-08 NOTE — Patient Instructions (Signed)
Increase Entresto 97-103 (1 tab), twice a day   Your physician recommends that you return for lab work in: 1 week  Your physician recommends that you schedule a follow-up appointment in: 3 months with Dr. [name]

## 2017-09-09 ENCOUNTER — Other Ambulatory Visit: Payer: Self-pay | Admitting: *Deleted

## 2017-09-09 MED ORDER — SACUBITRIL-VALSARTAN 97-103 MG PO TABS: 1.0000 | ORAL_TABLET | Freq: Two times a day (BID) | ORAL | 3 refills | Status: DC

## 2017-09-09 NOTE — Telephone Encounter (Signed)
PRINTED FOR PASS PROGRAM 

## 2017-09-15 ENCOUNTER — Other Ambulatory Visit (HOSPITAL_COMMUNITY): Payer: Self-pay

## 2017-09-15 MED FILL — TORSEMIDE 20 MG TABLET: 20 | 30 days supply | Qty: 30 | Fill #2

## 2017-09-20 ENCOUNTER — Other Ambulatory Visit (HOSPITAL_COMMUNITY): Payer: Self-pay

## 2017-09-23 ENCOUNTER — Ambulatory Visit (HOSPITAL_COMMUNITY)
Admission: RE | Admit: 2017-09-23 | Discharge: 2017-09-23 | Disposition: A | Discharge status: Home / Self Care | Payer: Self-pay | Source: Ambulatory Visit | Attending: Cardiology | Admitting: Cardiology

## 2017-09-23 DIAGNOSIS — I5022 Chronic systolic (congestive) heart failure: Secondary | ICD-10-CM | POA: Insufficient documentation

## 2017-09-23 LAB — BASIC METABOLIC PANEL
ANION GAP: 5 (ref 5–15)
BUN: 21 mg/dL — ABNORMAL HIGH (ref 6–20)
CALCIUM: 9.3 mg/dL (ref 8.9–10.3)
CO2: 25 mmol/L (ref 22–32)
Chloride: 108 mmol/L (ref 101–111)
Creatinine, Ser: 1.67 mg/dL — ABNORMAL HIGH (ref 0.61–1.24)
GFR, EST AFRICAN AMERICAN: 57 mL/min — AB (ref >60)
GFR, EST NON AFRICAN AMERICAN: 49 mL/min — AB (ref >60)
Glucose, Bld: 137 mg/dL — ABNORMAL HIGH (ref 65–99)
POTASSIUM: 4.2 mmol/L (ref 3.5–5.1)
SODIUM: 138 mmol/L (ref 135–145)

## 2017-10-11 ENCOUNTER — Ambulatory Visit (INDEPENDENT_AMBULATORY_CARE_PROVIDER_SITE_OTHER): Payer: Self-pay | Admitting: *Deleted

## 2017-10-11 DIAGNOSIS — I420 Dilated cardiomyopathy: Secondary | ICD-10-CM

## 2017-10-12 ENCOUNTER — Encounter: Payer: Self-pay | Admitting: Cardiology

## 2017-10-12 NOTE — Progress Notes (Signed)
Remote ICD transmission.   

## 2017-10-13 LAB — CUP PACEART REMOTE DEVICE CHECK
Battery Remaining Percentage: 73 %
Implantable Lead Implant Date: 20160812
Implantable Lead Location: 753858
MDC IDC PG IMPLANT DT: 20160812
MDC IDC SESS DTM: 20190107173800
Pulse Gen Serial Number: 117162

## 2017-10-14 MED FILL — ISOSORBIDE MN ER 30 MG TAB: 30 | 30 days supply | Qty: 30 | Fill #2

## 2017-10-14 MED FILL — ?ALLOPURINOL 100 MG TABS: 100 | 30 days supply | Qty: 30 | Fill #2

## 2017-10-14 MED FILL — SPIRONOLACTONE 25 MG TABS: 25 | 30 days supply | Qty: 30 | Fill #2

## 2017-10-25 ENCOUNTER — Ambulatory Visit: Payer: Self-pay | Admitting: Family Medicine

## 2017-10-27 MED FILL — AMIODARONE HCL 200 MG TAB: 200 | 30 days supply | Qty: 30 | Fill #2

## 2017-10-27 MED FILL — ATORVASTATIN 80 MG TABLET: 80 | 30 days supply | Qty: 30 | Fill #3

## 2017-11-01 ENCOUNTER — Encounter: Payer: Self-pay | Admitting: Family Medicine

## 2017-11-01 ENCOUNTER — Ambulatory Visit (INDEPENDENT_AMBULATORY_CARE_PROVIDER_SITE_OTHER): Payer: Self-pay | Admitting: Family Medicine

## 2017-11-01 VITALS — BP 108/74 | HR 90 | Temp 98.2°F | Resp 16 | Ht 67.0 in | Wt 330.6 lb

## 2017-11-01 DIAGNOSIS — N289 Disorder of kidney and ureter, unspecified: Secondary | ICD-10-CM

## 2017-11-01 DIAGNOSIS — N183 Chronic kidney disease, stage 3 (moderate): Secondary | ICD-10-CM

## 2017-11-01 DIAGNOSIS — Z23 Encounter for immunization: Secondary | ICD-10-CM

## 2017-11-01 DIAGNOSIS — Z794 Long term (current) use of insulin: Secondary | ICD-10-CM

## 2017-11-01 DIAGNOSIS — E1122 Type 2 diabetes mellitus with diabetic chronic kidney disease: Secondary | ICD-10-CM

## 2017-11-01 DIAGNOSIS — I10 Essential (primary) hypertension: Secondary | ICD-10-CM

## 2017-11-01 LAB — POCT URINALYSIS DIP (DEVICE)
BILIRUBIN URINE: NEGATIVE
Glucose, UA: NEGATIVE mg/dL
HGB URINE DIPSTICK: NEGATIVE
KETONES UR: NEGATIVE mg/dL
Leukocytes, UA: NEGATIVE
Nitrite: NEGATIVE
PH: 6 (ref 5.0–8.0)
PROTEIN: NEGATIVE mg/dL
Specific Gravity, Urine: 1.01 (ref 1.005–1.030)
Urobilinogen, UA: 0.2 mg/dL (ref 0.0–1.0)

## 2017-11-01 LAB — POCT GLYCOSYLATED HEMOGLOBIN (HGB A1C): Hemoglobin A1C: 6.6

## 2017-11-01 MED ORDER — GABAPENTIN 300 MG PO CAPS: 300.0000 mg | ORAL_CAPSULE | Freq: Three times a day (TID) | ORAL | 3 refills | Status: DC

## 2017-11-01 MED ORDER — ACETAMINOPHEN-CODEINE #3 300-30 MG PO TABS: 1.0000 | ORAL_TABLET | Freq: Four times a day (QID) | ORAL | 0 refills | Status: DC | PRN

## 2017-11-01 MED FILL — GABAPENTIN 300 MG CAPSULE: 300 | 30 days supply | Qty: 90 | Fill #0

## 2017-11-01 NOTE — Patient Instructions (Addendum)
Continue all medications as prescribed.  Complete the [place] Application in order to have referrals submitted.    Diabetes Mellitus and Nutrition When you have diabetes (diabetes mellitus), it is very important to have healthy eating habits because your blood sugar (glucose) levels are greatly affected by what you eat and drink. Eating healthy foods in the appropriate amounts, at about the same times every day, can help you:  Control your blood glucose.  Lower your risk of heart disease.  Improve your blood pressure.  Reach or maintain a healthy weight.  Every person with diabetes is different, and each person has different needs for a meal plan. Your health care provider may recommend that you work with a diet and nutrition specialist (dietitian) to make a meal plan that is best for you. Your meal plan may vary depending on factors such as:  The calories you need.  The medicines you take.  Your weight.  Your blood glucose, blood pressure, and cholesterol levels.  Your activity level.  Other health conditions you have, such as heart or kidney disease.  How do carbohydrates affect me? Carbohydrates affect your blood glucose level more than any other type of food. Eating carbohydrates naturally increases the amount of glucose in your blood. Carbohydrate counting is a method for keeping track of how many carbohydrates you eat. Counting carbohydrates is important to keep your blood glucose at a healthy level, especially if you use insulin or take certain oral diabetes medicines. It is important to know how many carbohydrates you can safely have in each meal. This is different for every person. Your dietitian can help you calculate how many carbohydrates you should have at each meal and for snack. Foods that contain carbohydrates include:  Bread, cereal, rice, pasta, and crackers.  Potatoes and corn.  Peas, beans, and lentils.  Milk and yogurt.  Fruit and  juice.  Desserts, such as cakes, cookies, ice cream, and candy.  How does alcohol affect me? Alcohol can cause a sudden decrease in blood glucose (hypoglycemia), especially if you use insulin or take certain oral diabetes medicines. Hypoglycemia can be a life-threatening condition. Symptoms of hypoglycemia (sleepiness, dizziness, and confusion) are similar to symptoms of having too much alcohol. If your health care provider says that alcohol is safe for you, follow these guidelines:  Limit alcohol intake to no more than 1 drink per day for nonpregnant women and 2 drinks per day for men. One drink equals 12 oz of beer, 5 oz of wine, or 1 oz of hard liquor.  Do not drink on an empty stomach.  Keep yourself hydrated with water, diet soda, or unsweetened iced tea.  Keep in mind that regular soda, juice, and other mixers may contain a lot of sugar and must be counted as carbohydrates.  What are tips for following this plan? Reading food labels  Start by checking the serving size on the label. The amount of calories, carbohydrates, fats, and other nutrients listed on the label are based on one serving of the food. Many foods contain more than one serving per package.  Check the total grams (g) of carbohydrates in one serving. You can calculate the number of servings of carbohydrates in one serving by dividing the total carbohydrates by 15. For example, if a food has 30 g of total carbohydrates, it would be equal to 2 servings of carbohydrates.  Check the number of grams (g) of saturated and trans fats in one serving. Choose foods that have low  or no amount of these fats.  Check the number of milligrams (mg) of sodium in one serving. Most people should limit total sodium intake to less than 2,300 mg per day.  Always check the nutrition information of foods labeled as "low-fat" or "nonfat". These foods may be higher in added sugar or refined carbohydrates and should be avoided.  Talk to your  dietitian to identify your daily goals for nutrients listed on the label. Shopping  Avoid buying canned, premade, or processed foods. These foods tend to be high in fat, sodium, and added sugar.  Shop around the outside edge of the grocery store. This includes fresh fruits and vegetables, bulk grains, fresh meats, and fresh dairy. Cooking  Use low-heat cooking methods, such as baking, instead of high-heat cooking methods like deep frying.  Cook using healthy oils, such as olive, canola, or sunflower oil.  Avoid cooking with butter, cream, or high-fat meats. Meal planning  Eat meals and snacks regularly, preferably at the same times every day. Avoid going long periods of time without eating.  Eat foods high in fiber, such as fresh fruits, vegetables, beans, and whole grains. Talk to your dietitian about how many servings of carbohydrates you can eat at each meal.  Eat 4-6 ounces of lean protein each day, such as lean meat, chicken, fish, eggs, or tofu. 1 ounce is equal to 1 ounce of meat, chicken, or fish, 1 egg, or 1/4 cup of tofu.  Eat some foods each day that contain healthy fats, such as avocado, nuts, seeds, and fish. Lifestyle   Check your blood glucose regularly.  Exercise at least 30 minutes 5 or more days each week, or as told by your health care provider.  Take medicines as told by your health care provider.  Do not use any products that contain nicotine or tobacco, such as cigarettes and e-cigarettes. If you need help quitting, ask your health care provider.  Work with a Social worker or diabetes educator to identify strategies to manage stress and any emotional and social challenges. What are some questions to ask my health care provider?  Do I need to meet with a diabetes educator?  Do I need to meet with a dietitian?  What number can I call if I have questions?  When are the best times to check my blood glucose? Where to find more information:  American Diabetes  Association: diabetes.org/food-and-fitness/food  Academy of Nutrition and Dietetics: PokerClues.dk  [employer] of Diabetes and Digestive and Kidney Diseases (NIH): ContactWire.be Summary  A healthy meal plan will help you control your blood glucose and maintain a healthy lifestyle.  Working with a diet and nutrition specialist (dietitian) can help you make a meal plan that is best for you.  Keep in mind that carbohydrates and alcohol have immediate effects on your blood glucose levels. It is important to count carbohydrates and to use alcohol carefully. This information is not intended to replace advice given to you by your health care provider. Make sure you discuss any questions you have with your health care provider. Document Released: 06/18/2005 Document Revised: 10/26/2016 Document Reviewed: 10/26/2016 Elsevier Interactive Patient Education  2018 [employer].      Exercising to [employer] Exercising can help you to lose weight. In order to lose weight through exercise, you need to do vigorous-intensity exercise. You can tell that you are exercising with vigorous intensity if you are breathing very hard and fast and cannot hold a conversation while exercising. Moderate-intensity exercise helps  to maintain your current weight. You can tell that you are exercising at a moderate level if you have a higher heart rate and faster breathing, but you are still able to hold a conversation. How often should I exercise? Choose an activity that you enjoy and set realistic goals. Your health care provider can help you to make an activity plan that works for you. Exercise regularly as directed by your health care provider. This may include:  Doing resistance training twice each week, such as: ? Push-ups. ? Sit-ups. ? Lifting weights. ? Using resistance  bands.  Doing a given intensity of exercise for a given amount of time. Choose from these options: ? 150 minutes of moderate-intensity exercise every week. ? 75 minutes of vigorous-intensity exercise every week. ? A mix of moderate-intensity and vigorous-intensity exercise every week.  Children, pregnant women, people who are out of shape, people who are overweight, and older adults may need to consult a health care provider for individual recommendations. If you have any sort of medical condition, be sure to consult your health care provider before starting a new exercise program. What are some activities that can help me to lose weight?  Walking at a rate of at least 4.5 miles an hour.  Jogging or running at a rate of 5 miles per hour.  Biking at a rate of at least 10 miles per hour.  Lap swimming.  Roller-skating or in-line skating.  Cross-country skiing.  Vigorous competitive sports, such as football, basketball, and soccer.  Jumping rope.  Aerobic dancing. How can I be more active in my day-to-day activities?  Use the stairs instead of the elevator.  Take a walk during your lunch break.  If you drive, park your car farther away from work or school.  If you take public transportation, get off one stop early and walk the rest of the way.  Make all of your phone calls while standing up and walking around.  Get up, stretch, and walk around every 30 minutes throughout the day. What guidelines should I follow while exercising?  Do not exercise so much that you hurt yourself, feel dizzy, or get very short of breath.  Consult your health care provider prior to starting a new exercise program.  Wear comfortable clothes and shoes with good support.  Drink plenty of water while you exercise to prevent dehydration or heat stroke. Body water is lost during exercise and must be replaced.  Work out until you breathe faster and your heart beats faster. This information is not  intended to replace advice given to you by your health care provider. Make sure you discuss any questions you have with your health care provider. Document Released: 10/24/2010 Document Revised: 02/27/2016 Document Reviewed: 02/22/2014 Elsevier Interactive Patient Education  [employer].

## 2017-11-01 NOTE — Progress Notes (Signed)
Patient ID: Jeffrey [name], male    DOB: Feb 03, 1976, 42 y.o.   MRN: [id number]  PCP: Scot Jun, FNP  Chief Complaint  Patient presents with  . Follow-up    3 month on HTN  . Knee Pain    bilateral    Subjective:  HPI Jeffrey [name] is a 42 y.o. male presents for evaluation of chronic conditions and worsening bilateral knee pain. Problems significant for heart failure with ICD placement, morbid obesity, gout, DM, renal cell carcinoma status post nephrectomy, obstructive of sleep apnea, hypertension, and dyspnea. He is followed by cardiology for management of heart disease. EF 20-25% per 06/02/2017 echocardiogram. He is currently prescribed Entresto and dose was titrated during December heart failure clinic visit. Likely secondary to aggressive diuretic therapy, serum creatinine has steadily trended upwardly (Creatinine 1.67/BUN 21). 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 Jeffrey [name] reports worsening sleep apnea. He was prescribed a CPAP machine sometime ago however his machine is broken. He sleeps sitting up and notes pronounced PND symptoms causing him to sleep in an upright position overnight. He also complains of bilateral worsening knee pain. Prior images were significant for moderate osteoarthritis. He was evaluated once by orthopedics and received cortisone injections which improved symptoms temporarily. Of recent knee pain has worsened. He is wanting to loose weight in order to aid in decreasing knee pain. Current Body mass index is 51.78 kg/m. Admits to no routine exercise and is making efforts to improve diet.  Jeffrey [name] suffers from diabetes which has remained well-controlled on NPH. No recent eye exam and reports diminished vision. Denies hypoglycemia.  He denies chest pain, shortness of breath, headaches, new weakness, or dizziness.   Social History   Socioeconomic History  . Marital status: Significant Other    Spouse name: Not on file  . Number of  children: Not on file  . Years of education: Not on file  . Highest education level: Not on file  Social Needs  . Financial resource strain: Not on file  . Food insecurity - worry: Not on file  . Food insecurity - inability: Not on file  . Transportation needs - medical: Not on file  . Transportation needs - non-medical: Not on file  Occupational History  . Not on file  Tobacco Use  . Smoking status: Former Smoker    Packs/day: 0.25    Years: 22.00    Pack years: 5.50    Last attempt to quit: 04/16/2015    Years since quitting: 2.5  . Smokeless tobacco: Never Used  Substance and Sexual Activity  . Alcohol use: No    Comment: occ   . Drug use: Yes    Frequency: 14.0 times per week    Types: Marijuana    Comment: uses marijuana twice a day  . Sexual activity: Yes    Birth control/protection: Condom  Other Topics Concern  . Not on file  Social History Narrative  . Not on file    Family History  Problem Relation Age of Onset  . Hypertension Mother   . Diabetes Maternal Aunt   . Heart attack Neg Hx   . Stroke Neg Hx    Review of Systems  Constitutional: Negative.   HENT: Negative.   Eyes: Negative.   Respiratory: Positive for shortness of breath.        Chronic SOB and PND  Cardiovascular: Negative.   Gastrointestinal: Negative.   Musculoskeletal: Positive for arthralgias.  Bilateral knee pain   Neurological: Negative.   Hematological: Negative.   Psychiatric/Behavioral: Negative.     Patient Active Problem List   Diagnosis Date Noted  . PAF (paroxysmal atrial fibrillation) ([place])   . Dilated cardiomyopathy ([place])   . Pollen allergy 02/17/2017  . Dyslipidemia 02/17/2017  . Hematochezia 11/08/2015  . CKD (chronic kidney disease), stage III ([place]) 06/20/2015  . Cardiac device in situ, other   . Chronic systolic congestive heart failure ([place]) 03/11/2015  . Gouty arthritis 03/11/2015  . Chest pain 12/28/2014  . Aspirin allergy 04/08/2014  . Renal cell  carcinoma ([place])   . Gout attack 11/10/2011  . Morbid obesity ([place]) 10/23/2011  . OSA (obstructive sleep apnea) 10/23/2011  . Type 2 diabetes mellitus with stage 3 chronic kidney disease, with long-term current use of insulin ([place]) 10/22/2011  . [place], MILD 12/25/2010  . HYPERTENSION, BENIGN ESSENTIAL 12/25/2010    Allergies  Allergen Reactions  . Aspirin Shortness Of Breath, Itching and Rash     Burning sensation (Patient reports he tolerates other NSAIDS)   . Bee Venom Hives and Swelling  . Lisinopril Cough  . Tomato Rash    Prior to Admission medications   Medication Sig Start Date End Date Taking? Authorizing Provider  allopurinol (ZYLOPRIM) 100 MG tablet Take 1 tablet (100 mg total) by mouth daily. 02/17/17  Yes Tresa Garter, MD  amiodarone (PACERONE) 200 MG tablet Take 1 tablet (200 mg total) by mouth daily. 06/28/17  Yes Shirley Friar, PA-C  apixaban (ELIQUIS) 5 MG TABS tablet Take 1 tablet (5 mg total) by mouth 2 (two) times daily. 06/13/17  Yes Simmons, Brittainy M, PA-C  atorvastatin (LIPITOR) 80 MG tablet TAKE 1 TABLET (80 MG TOTAL) BY MOUTH EVERY EVENING. 02/17/17  Yes Jegede, Olugbemiga E, MD  colchicine 0.6 MG tablet Take 1 tablet (0.6 mg total) by mouth 2 (two) times daily. 02/17/17  Yes Tresa Garter, MD  cyclobenzaprine (FLEXERIL) 5 MG tablet Take 1 tablet (5 mg total) 3 (three) times daily as needed by mouth for muscle spasms. 08/22/17  Yes Lovenia Kim, MD  hydrALAZINE (APRESOLINE) 100 MG tablet Take 1 tablet (100 mg total) by mouth 3 (three) times daily. 07/22/17  Yes Shirley Friar, PA-C  insulin NPH-regular Human (NOVOLIN 70/30) (70-30) 100 UNIT/ML injection Inject 34 Units into the skin 2 (two) times daily with a meal. 07/09/17  Yes Jegede, Olugbemiga E, MD  isosorbide mononitrate (IMDUR) 30 MG 24 hr tablet Take 1 tablet (30 mg total) by mouth daily. 06/23/17  Yes Shirley Friar, PA-C  loratadine (CLARITIN) 10 MG tablet  Take 1 tablet (10 mg total) by mouth daily. Patient taking differently: Take 10 mg by mouth daily as needed for allergies.  02/17/17  Yes Tresa Garter, MD  Magnesium Oxide 200 MG TABS Take 1 tablet (200 mg total) by mouth daily. Patient taking differently: Take 100 mg by mouth daily.  01/19/17  Yes Shirley Friar, PA-C  meclizine (ANTIVERT) 25 MG tablet Take 1 tablet (25 mg total) by mouth 3 (three) times daily as needed for dizziness (Take meclizine for spinning dizziness. You may also take Valium if your symptoms are not relieved by the meclizine alone.). 02/11/16  Yes Pfeiffer, Jeannie Done, MD  sacubitril-valsartan (ENTRESTO) 97-103 MG Take 1 tablet by mouth 2 (two) times daily. 09/09/17  Yes Tresa Garter, MD  spironolactone (ALDACTONE) 25 MG tablet Take 1 tablet (25 mg total) by mouth daily. 06/23/17  Yes Barrington Ellison  Mitzi Hansen, PA-C  torsemide (DEMADEX) 20 MG tablet Take 1 tablet (20 mg total) by mouth daily. 06/23/17  Yes Shirley Friar, PA-C  traMADol (ULTRAM) 50 MG tablet Take 1 tablet (50 mg total) by mouth every 6 (six) hours as needed. 06/05/17  Yes Lajean Saver, MD  oxyCODONE-acetaminophen (ROXICET) 5-325 MG tablet Take 1 tablet every 4 (four) hours as needed by mouth for severe pain. Patient not taking: Reported on 11/01/2017 08/22/17   Lovenia Kim, MD  cetirizine (ZYRTEC ALLERGY) 10 MG tablet Take 1 tablet (10 mg total) by mouth daily. 06/30/12 09/21/12  [name], PA-C    Past Medical, Surgical Family and Social History reviewed and updated.    Objective:   Today's Vitals   11/01/17 1117  BP: 108/74  Pulse: 90  Resp: 16  Temp: 98.2 F (36.8 C)  TempSrc: Oral  Weight: (!) 330 lb 9.6 oz (150 kg)  Height: 5\' 7"  (1.702 m)    Wt Readings from Last 3 Encounters:  11/01/17 (!) 330 lb 9.6 oz (150 kg)  09/08/17 (!) 325 lb 6.4 oz (147.6 kg)  08/22/17 295 lb (133.8 kg)   Physical Exam Constitutional: He is oriented to person, place, and time. He  appears well-developed and well-nourished.  HENT:  Head: Normocephalic and atraumatic.  Eyes: Pupils are equal, round, and reactive to light. Conjunctivae and EOM are normal.  Neck: Normal range of motion. Neck supple. No thyromegaly present.  Cardiovascular: Normal rate, normal heart sounds and intact distal pulses.   Pulmonary/Chest: Effort normal and diminished breath sounds normal. No respiratory distress. He has no wheezes. He exhibits no tenderness.  Abdominal: Soft. Bowel sounds are normal.  Musculoskeletal: Normal range of motion. He exhibits no edema.  Lymphadenopathy:    He has no cervical adenopathy.  Neurological: He is alert and oriented to person, place, and time. He has normal reflexes.  Skin: Skin is warm and dry.  Psychiatric: He has a normal mood and affect. His behavior is normal. Judgment and thought content normal.    Assessment & Plan:  1. Type 2 diabetes mellitus with stage 3 chronic kidney disease, with long-term current use of insulin ([place]), A1C 6.6, stable. No changes in current medication regimen. Encourage focus on weight loss in effort to improve glycemic control. Encourage a modified carbohydrate diet and gradually increase activity as tolerated to improve weight loss.   2. Renal function impairment, repeating CMP today. If renal function remains elevated, will refer to nephrology for further evaluation and management. Patient is at increased risk for ESRD due to solitary kidney, diabetes, and chronic diuretic therapy.  3. Essential hypertension, stable and well-controlled. We have discussed target BP range and blood pressure goal. I have advised patient to check BP regularly and to call us back or report to clinic if the numbers are consistently higher than 130/90. We discussed the importance of compliance with medical therapy and DASH diet recommended, consequences of uncontrolled hypertension discussed. Continue current BP medication.  4. Need for pneumococcal  vaccination- Pneumococcal conjugate vaccine 13-valent IM   Meds ordered this encounter  Medications  . acetaminophen-codeine (TYLENOL #3) 300-30 MG tablet    Sig: Take 1 tablet by mouth every 6 (six) hours as needed for moderate pain.    Dispense:  60 tablet    Refill:  0    Order Specific Question:   Supervising Provider    Answer:   Tresa Garter [id number]  . gabapentin (NEURONTIN) 300 MG capsule    Sig:  Take 1 capsule (300 mg total) by mouth 3 (three) times daily.    Dispense:  90 capsule    Refill:  3    Order Specific Question:   Supervising Provider    Answer:   Tresa Garter [id number]    Orders Placed This Encounter  Procedures  . Pneumococcal conjugate vaccine 13-valent IM  . Comprehensive metabolic panel  . POCT glycosylated hemoglobin (Hb A1C)  . POCT urinalysis dip (device)    RTC: 6 months for chronic condition management  Patient provided [place] Form. Patient will require referrals to sleep medicine and orthopedics.    Carroll Sage. Kenton Kingfisher, MSN, FNP-C [facility]  [address] [place], [place] [address] [phone]

## 2017-11-02 LAB — COMPREHENSIVE METABOLIC PANEL
A/G RATIO: 1.2 (ref 1.2–2.2)
ALBUMIN: 4 g/dL (ref 3.5–5.5)
ALT: 30 IU/L (ref 0–44)
AST: 21 IU/L (ref 0–40)
Alkaline Phosphatase: 65 IU/L (ref 39–117)
BUN / CREAT RATIO: 14 (ref 9–20)
BUN: 24 mg/dL (ref 6–24)
Bilirubin Total: 0.4 mg/dL (ref 0.0–1.2)
CO2: 21 mmol/L (ref 20–29)
Calcium: 9.5 mg/dL (ref 8.7–10.2)
Chloride: 104 mmol/L (ref 96–106)
Creatinine, Ser: 1.73 mg/dL — ABNORMAL HIGH (ref 0.76–1.27)
GFR, EST AFRICAN AMERICAN: 55 mL/min/{1.73_m2} — AB (ref >59)
GFR, EST NON AFRICAN AMERICAN: 48 mL/min/{1.73_m2} — AB (ref >59)
GLOBULIN, TOTAL: 3.3 g/dL (ref 1.5–4.5)
Glucose: 197 mg/dL — ABNORMAL HIGH (ref 65–99)
POTASSIUM: 4.3 mmol/L (ref 3.5–5.2)
SODIUM: 138 mmol/L (ref 134–144)
Total Protein: 7.3 g/dL (ref 6.0–8.5)

## 2017-11-02 MED FILL — TORSEMIDE 20 MG TABLET: 20 | 30 days supply | Qty: 30 | Fill #3

## 2017-11-03 MED FILL — ACETAMINOPHEN/COD #3 TABLET: 300-30 | 15 days supply | Qty: 60 | Fill #0

## 2017-11-04 ENCOUNTER — Telehealth: Payer: Self-pay | Admitting: Family Medicine

## 2017-11-04 NOTE — Telephone Encounter (Signed)
Left a vm for patient to callback 

## 2017-11-04 NOTE — Addendum Note (Signed)
Addended by: Scot Jun on: 11/04/2017 11:18 AM   Modules accepted: Orders

## 2017-11-04 NOTE — Telephone Encounter (Signed)
Patient notified

## 2017-11-04 NOTE — Telephone Encounter (Signed)
Renal function remains abnormal.  As previously discussed during his office visit I am referring him to nephrology for further evaluation of kidney disease.  If he has not heard anything from [facility] within the next 7-10 days he is advised to follow-up with the office to inquire about referral.  Carroll Sage. Kenton Kingfisher, MSN, FNP-C [facility]  [address] [place], [place] [address] [phone]

## 2017-11-18 ENCOUNTER — Ambulatory Visit: Payer: Self-pay

## 2017-11-25 MED FILL — ?ALLOPURINOL 100 MG TABS: 100 | 30 days supply | Qty: 30 | Fill #3

## 2017-11-25 MED FILL — ?SPIRONOLACTONE 25 MG TABLE: 25 | 30 days supply | Qty: 30 | Fill #3

## 2017-11-25 MED FILL — !COLCRYS 0.6 MG TABLET: 0.6 MG | 15 days supply | Qty: 30 | Fill #1

## 2017-11-25 MED FILL — hydrALAZINE HCL 100 MG TABS: 100 | 30 days supply | Qty: 90 | Fill #1

## 2017-11-25 MED FILL — $ELIQUIS 5 MG TABLET: 5 | 30 days supply | Qty: 60 | Fill #1

## 2017-11-25 MED FILL — ?ISOSORBIDE MN 30 MG TAB SA: 30 | 30 days supply | Qty: 30 | Fill #3

## 2017-12-03 ENCOUNTER — Ambulatory Visit: Payer: Medicaid Other | Attending: Internal Medicine

## 2017-12-06 ENCOUNTER — Ambulatory Visit: Payer: Self-pay

## 2017-12-08 MED FILL — TORSEMIDE 20 MG TABLET: 20 | 30 days supply | Qty: 30 | Fill #4

## 2017-12-09 ENCOUNTER — Encounter (HOSPITAL_COMMUNITY): Payer: Self-pay | Admitting: Internal Medicine

## 2017-12-14 ENCOUNTER — Telehealth: Payer: Self-pay

## 2017-12-14 NOTE — Telephone Encounter (Signed)
Patient will bring orange card by tomorrow so information can be sent to orange card to process appointment with [facility].

## 2017-12-29 ENCOUNTER — Inpatient Hospital Stay (HOSPITAL_COMMUNITY)
Admission: EM | Admit: 2017-12-29 | Discharge: 2018-01-01 | DRG: 291 | Disposition: A | Discharge status: Home / Self Care | Payer: Medicaid Other | Attending: Internal Medicine | Admitting: Internal Medicine

## 2017-12-29 ENCOUNTER — Emergency Department (HOSPITAL_COMMUNITY): Payer: Medicaid Other

## 2017-12-29 ENCOUNTER — Encounter (HOSPITAL_COMMUNITY): Payer: Self-pay | Admitting: Emergency Medicine

## 2017-12-29 DIAGNOSIS — R0902 Hypoxemia: Secondary | ICD-10-CM | POA: Diagnosis present

## 2017-12-29 DIAGNOSIS — E1122 Type 2 diabetes mellitus with diabetic chronic kidney disease: Secondary | ICD-10-CM | POA: Diagnosis present

## 2017-12-29 DIAGNOSIS — Z886 Allergy status to analgesic agent status: Secondary | ICD-10-CM

## 2017-12-29 DIAGNOSIS — Z8249 Family history of ischemic heart disease and other diseases of the circulatory system: Secondary | ICD-10-CM

## 2017-12-29 DIAGNOSIS — N183 Chronic kidney disease, stage 3 unspecified: Secondary | ICD-10-CM | POA: Diagnosis present

## 2017-12-29 DIAGNOSIS — Z9111 Patient's noncompliance with dietary regimen: Secondary | ICD-10-CM

## 2017-12-29 DIAGNOSIS — M109 Gout, unspecified: Secondary | ICD-10-CM

## 2017-12-29 DIAGNOSIS — Z87891 Personal history of nicotine dependence: Secondary | ICD-10-CM

## 2017-12-29 DIAGNOSIS — E1159 Type 2 diabetes mellitus with other circulatory complications: Secondary | ICD-10-CM | POA: Diagnosis present

## 2017-12-29 DIAGNOSIS — R55 Syncope and collapse: Secondary | ICD-10-CM | POA: Diagnosis present

## 2017-12-29 DIAGNOSIS — I480 Paroxysmal atrial fibrillation: Secondary | ICD-10-CM | POA: Diagnosis present

## 2017-12-29 DIAGNOSIS — Z85528 Personal history of other malignant neoplasm of kidney: Secondary | ICD-10-CM

## 2017-12-29 DIAGNOSIS — I429 Cardiomyopathy, unspecified: Secondary | ICD-10-CM | POA: Diagnosis present

## 2017-12-29 DIAGNOSIS — J811 Chronic pulmonary edema: Secondary | ICD-10-CM | POA: Diagnosis not present

## 2017-12-29 DIAGNOSIS — I5023 Acute on chronic systolic (congestive) heart failure: Secondary | ICD-10-CM | POA: Diagnosis present

## 2017-12-29 DIAGNOSIS — Z6841 Body Mass Index (BMI) 40.0 and over, adult: Secondary | ICD-10-CM

## 2017-12-29 DIAGNOSIS — W19XXXA Unspecified fall, initial encounter: Secondary | ICD-10-CM

## 2017-12-29 DIAGNOSIS — G4733 Obstructive sleep apnea (adult) (pediatric): Secondary | ICD-10-CM | POA: Diagnosis present

## 2017-12-29 DIAGNOSIS — I472 Ventricular tachycardia, unspecified: Secondary | ICD-10-CM

## 2017-12-29 DIAGNOSIS — I071 Rheumatic tricuspid insufficiency: Secondary | ICD-10-CM | POA: Diagnosis present

## 2017-12-29 DIAGNOSIS — I130 Hypertensive heart and chronic kidney disease with heart failure and stage 1 through stage 4 chronic kidney disease, or unspecified chronic kidney disease: Principal | ICD-10-CM | POA: Diagnosis present

## 2017-12-29 DIAGNOSIS — Z9119 Patient's noncompliance with other medical treatment and regimen: Secondary | ICD-10-CM

## 2017-12-29 DIAGNOSIS — Z905 Acquired absence of kidney: Secondary | ICD-10-CM

## 2017-12-29 DIAGNOSIS — E7800 Pure hypercholesterolemia, unspecified: Secondary | ICD-10-CM | POA: Diagnosis present

## 2017-12-29 DIAGNOSIS — N179 Acute kidney failure, unspecified: Secondary | ICD-10-CM | POA: Diagnosis present

## 2017-12-29 DIAGNOSIS — Z794 Long term (current) use of insulin: Secondary | ICD-10-CM

## 2017-12-29 DIAGNOSIS — Z7901 Long term (current) use of anticoagulants: Secondary | ICD-10-CM

## 2017-12-29 DIAGNOSIS — M25571 Pain in right ankle and joints of right foot: Secondary | ICD-10-CM | POA: Diagnosis present

## 2017-12-29 DIAGNOSIS — I5022 Chronic systolic (congestive) heart failure: Secondary | ICD-10-CM

## 2017-12-29 DIAGNOSIS — I10 Essential (primary) hypertension: Secondary | ICD-10-CM

## 2017-12-29 DIAGNOSIS — Z833 Family history of diabetes mellitus: Secondary | ICD-10-CM

## 2017-12-29 DIAGNOSIS — R042 Hemoptysis: Secondary | ICD-10-CM | POA: Diagnosis present

## 2017-12-29 DIAGNOSIS — Z79899 Other long term (current) drug therapy: Secondary | ICD-10-CM

## 2017-12-29 DIAGNOSIS — Z4502 Encounter for adjustment and management of automatic implantable cardiac defibrillator: Secondary | ICD-10-CM

## 2017-12-29 DIAGNOSIS — I509 Heart failure, unspecified: Secondary | ICD-10-CM

## 2017-12-29 HISTORY — DX: Ventricular tachycardia: I47.2

## 2017-12-29 HISTORY — DX: Ventricular tachycardia, unspecified: I47.20

## 2017-12-29 LAB — URINALYSIS, ROUTINE W REFLEX MICROSCOPIC
Bilirubin Urine: NEGATIVE
Glucose, UA: NEGATIVE mg/dL
Ketones, ur: NEGATIVE mg/dL
Leukocytes, UA: NEGATIVE
Nitrite: NEGATIVE
Protein, ur: >=300 mg/dL — AB
Specific Gravity, Urine: 1.02 (ref 1.005–1.030)
pH: 5 (ref 5.0–8.0)

## 2017-12-29 LAB — CBC
HEMATOCRIT: 39.2 % (ref 39.0–52.0)
Hemoglobin: 12.3 g/dL — ABNORMAL LOW (ref 13.0–17.0)
MCH: 23.1 pg — ABNORMAL LOW (ref 26.0–34.0)
MCHC: 31.4 g/dL (ref 30.0–36.0)
MCV: 73.7 fL — AB (ref 78.0–100.0)
Platelets: 337 10*3/uL (ref 150–400)
RBC: 5.32 MIL/uL (ref 4.22–5.81)
RDW: 16.8 % — AB (ref 11.5–15.5)
WBC: 10.1 10*3/uL (ref 4.0–10.5)

## 2017-12-29 LAB — BASIC METABOLIC PANEL WITH GFR
Anion gap: 10 (ref 5–15)
BUN: 28 mg/dL — ABNORMAL HIGH (ref 6–20)
CO2: 19 mmol/L — ABNORMAL LOW (ref 22–32)
Calcium: 9.2 mg/dL (ref 8.9–10.3)
Chloride: 107 mmol/L (ref 101–111)
Creatinine, Ser: 1.9 mg/dL — ABNORMAL HIGH (ref 0.61–1.24)
GFR calc Af Amer: 48 mL/min — ABNORMAL LOW
GFR calc non Af Amer: 42 mL/min — ABNORMAL LOW
Glucose, Bld: 202 mg/dL — ABNORMAL HIGH (ref 65–99)
Potassium: 3.9 mmol/L (ref 3.5–5.1)
Sodium: 136 mmol/L (ref 135–145)

## 2017-12-29 LAB — I-STAT TROPONIN, ED: Troponin i, poc: 0.04 ng/mL (ref 0.00–0.08)

## 2017-12-29 LAB — CBG MONITORING, ED: Glucose-Capillary: 184 mg/dL — ABNORMAL HIGH (ref 65–99)

## 2017-12-29 NOTE — ED Provider Notes (Addendum)
[facility] EMERGENCY DEPARTMENT Provider Note   CSN: [id number] Arrival date & time: 12/29/17  1913     History   Chief Complaint Chief Complaint  Patient presents with  . Loss of Consciousness    HPI Jeffrey [name] is a 42 y.o. male.  42 year old male with extensive past medical history including NICM, CHF, ICD, type 2 diabetes mellitus, OSA, hypertension, CKD who presents with syncope.  This evening the patient was sitting in his house holding his son and reports briefly feeling lightheaded and the next thing he knew he was getting up from being on the ground.  He had apparently passed out and dropped his child.  He was not sure what happened but does have a vague memory of having a feeling of being punched in the chest.  This is never happened before.  He has had 2 days of intermittent chest tightness that he thought was related to having too much fluid.  He has had mild shortness of breath today.  He denies any significant chest pain currently.  No fevers or recent illness.  He is compliant with his medications.  The history is provided by the patient.    Past Medical History:  Diagnosis Date  . Anxiety   . Aspirin allergy   . Childhood asthma   . Chronic systolic CHF (congestive heart failure) ([place])    a. EF 20-25% in 2012. b. EF 45-50% in 10/2011 with nonischemic nuc - presumed NICM. c. 12/2014 Echo: Sev depressed LV fxn, sev dil LV, mild LVH, mild MR, sev dil LA, mildly reduced RV fxn.  . CKD (chronic kidney disease) stage 2, GFR 60-89 ml/min   . High cholesterol   . Hypertension   . Morbid obesity ([place])   . Nephrolithiasis   . OSA on CPAP   . Presumed NICM    a. 04/2014 Myoview: EF 26%, glob HK, sev glob HK, ? prior infarct;  b. Never cathed 2/2 CKD.  [name] Renal cell carcinoma ([place])    a. s/p Rt robotic assisted partial converted to radical nephrectomy on 01/2013.  . Troponin level elevated    a. 04/2014, 12/2014: felt due to CHF.  . Type II diabetes  [facility])     Patient Active Problem List   Diagnosis Date Noted  . PAF (paroxysmal atrial fibrillation) ([place])   . Dilated cardiomyopathy ([place])   . Pollen allergy 02/17/2017  . Dyslipidemia 02/17/2017  . Hematochezia 11/08/2015  . CKD (chronic kidney disease), stage III ([place]) 06/20/2015  . Cardiac device in situ, other   . Chronic systolic congestive heart failure ([place]) 03/11/2015  . Gouty arthritis 03/11/2015  . Chest pain 12/28/2014  . Aspirin allergy 04/08/2014  . Renal cell carcinoma ([place])   . Gout attack 11/10/2011  . Morbid obesity ([place]) 10/23/2011  . OSA (obstructive sleep apnea) 10/23/2011  . Type 2 diabetes mellitus with stage 3 chronic kidney disease, with long-term current use of insulin ([place]) 10/22/2011  . [place], MILD 12/25/2010  . HYPERTENSION, BENIGN ESSENTIAL 12/25/2010    Past Surgical History:  Procedure Laterality Date  . APPENDECTOMY  07/2004  . CARDIAC CATHETERIZATION N/A 05/17/2015   Procedure: Right/Left Heart Cath and Coronary Angiography;  Surgeon: Jolaine Artist, MD;  Location: [facility];  Service: Cardiovascular;  Laterality: N/A;  . EP IMPLANTABLE DEVICE N/A 05/17/2015   Procedure: SubQ ICD Implant;  Surgeon: Will Meredith Leeds, MD;  Location: [facility];  Service: Cardiovascular;  Laterality: N/A;  .  ROBOTIC ASSISTED LAPAROSCOPIC LYSIS OF ADHESION  01/13/2013   Procedure: ROBOTIC ASSISTED LAPAROSCOPIC LYSIS OF ADHESION EXTENSIVE;  Surgeon: Alexis Frock, MD;  Location: [facility];  Service: Urology;;  . ROBOTIC ASSITED PARTIAL NEPHRECTOMY Right 01/13/2013   Procedure: ROBOTIC ASSITED PARTIAL NEPHRECTOMY CONVERTED TO ROBOTIC ASSISTED RIGHT RADICAL NEPHRECTOMY;  Surgeon: Alexis Frock, MD;  Location: [facility];  Service: Urology;  Laterality: Right;        Home Medications    Prior to Admission medications   Medication Sig Start Date End Date Taking? Authorizing Provider  acetaminophen-codeine (TYLENOL #3) 300-30 MG  tablet Take 1 tablet by mouth every 6 (six) hours as needed for moderate pain. 11/01/17  Yes Scot Jun, FNP  allopurinol (ZYLOPRIM) 100 MG tablet Take 1 tablet (100 mg total) by mouth daily. 02/17/17  Yes Tresa Garter, MD  amiodarone (PACERONE) 200 MG tablet Take 1 tablet (200 mg total) by mouth daily. 06/28/17  Yes Shirley Friar, PA-C  apixaban (ELIQUIS) 5 MG TABS tablet Take 1 tablet (5 mg total) by mouth 2 (two) times daily. 06/13/17  Yes Simmons, Brittainy M, PA-C  atorvastatin (LIPITOR) 80 MG tablet TAKE 1 TABLET (80 MG TOTAL) BY MOUTH EVERY EVENING. 02/17/17  Yes Jegede, Olugbemiga E, MD  colchicine 0.6 MG tablet Take 1 tablet (0.6 mg total) by mouth 2 (two) times daily. Patient taking differently: Take 0.6 mg by mouth 2 (two) times daily as needed (gout).  02/17/17  Yes Tresa Garter, MD  cyclobenzaprine (FLEXERIL) 5 MG tablet Take 1 tablet (5 mg total) 3 (three) times daily as needed by mouth for muscle spasms. 08/22/17  Yes Lovenia Kim, MD  gabapentin (NEURONTIN) 300 MG capsule Take 1 capsule (300 mg total) by mouth 3 (three) times daily. 11/01/17  Yes Scot Jun, FNP  hydrALAZINE (APRESOLINE) 100 MG tablet Take 1 tablet (100 mg total) by mouth 3 (three) times daily. 07/22/17  Yes Shirley Friar, PA-C  insulin NPH-regular Human (NOVOLIN 70/30) (70-30) 100 UNIT/ML injection Inject 34 Units into the skin 2 (two) times daily with a meal. 07/09/17  Yes Jegede, Olugbemiga E, MD  isosorbide mononitrate (IMDUR) 30 MG 24 hr tablet Take 1 tablet (30 mg total) by mouth daily. 06/23/17  Yes Shirley Friar, PA-C  Magnesium Oxide 200 MG TABS Take 1 tablet (200 mg total) by mouth daily. Patient taking differently: Take 100 mg by mouth daily.  01/19/17  Yes Shirley Friar, PA-C  meclizine (ANTIVERT) 25 MG tablet Take 1 tablet (25 mg total) by mouth 3 (three) times daily as needed for dizziness (Take meclizine for spinning dizziness. You may also  take Valium if your symptoms are not relieved by the meclizine alone.). 02/11/16  Yes Pfeiffer, Jeannie Done, MD  sacubitril-valsartan (ENTRESTO) 97-103 MG Take 1 tablet by mouth 2 (two) times daily. 09/09/17  Yes Tresa Garter, MD  spironolactone (ALDACTONE) 25 MG tablet Take 1 tablet (25 mg total) by mouth daily. 06/23/17  Yes Shirley Friar, PA-C  torsemide (DEMADEX) 20 MG tablet Take 1 tablet (20 mg total) by mouth daily. 06/23/17  Yes Shirley Friar, PA-C  traMADol (ULTRAM) 50 MG tablet Take 1 tablet (50 mg total) by mouth every 6 (six) hours as needed. Patient taking differently: Take 50 mg by mouth every 6 (six) hours as needed for moderate pain.  06/05/17  Yes Lajean Saver, MD  cetirizine (ZYRTEC ALLERGY) 10 MG tablet Take 1 tablet (10 mg total) by mouth daily. 06/30/12 09/21/12  Marlon Pel,  Herbie Baltimore PA-C    Family History Family History  Problem Relation Age of Onset  . Hypertension Mother   . Diabetes Maternal Aunt   . Heart attack Neg Hx   . Stroke Neg Hx     Social History Social History   Tobacco Use  . Smoking status: Former Smoker    Packs/day: 0.25    Years: 22.00    Pack years: 5.50    Last attempt to quit: 04/16/2015    Years since quitting: 2.7  . Smokeless tobacco: Never Used  Substance Use Topics  . Alcohol use: No    Comment: occ   . Drug use: Yes    Frequency: 14.0 times per week    Types: Marijuana    Comment: uses marijuana twice a day     Allergies   Aspirin; Bee venom; Lisinopril; and Tomato   Review of Systems Review of Systems All other systems reviewed and are negative except that which was mentioned in HPI   Physical Exam Updated Vital Signs BP (!) 116/99 (BP Location: Right Arm)   Pulse (!) 104   Temp 98.7 F (37.1 C) (Oral)   Resp (!) 24   Ht 5\' 7"  (1.702 m)   Wt (!) 149.7 kg (330 lb)   SpO2 94%   BMI 51.69 kg/m   Physical Exam  Constitutional: He is oriented to person, place, and time. He appears well-developed and  well-nourished. No distress.  HENT:  Head: Normocephalic and atraumatic.  Moist mucous membranes  Eyes: Pupils are equal, round, and reactive to light. Conjunctivae are normal.  Neck: Neck supple.  Cardiovascular: Regular rhythm and normal heart sounds. Tachycardia present.  No murmur heard. Pulmonary/Chest: Effort normal and breath sounds normal.  Abdominal: Soft. Bowel sounds are normal. He exhibits no distension. There is no tenderness.  Musculoskeletal: He exhibits edema (mild BLE).  Neurological: He is alert and oriented to person, place, and time.  Fluent speech  Skin: Skin is warm and dry.  Psychiatric: He has a normal mood and affect. Judgment normal.  Nursing note and vitals reviewed.    ED Treatments / Results  Labs (all labs ordered are listed, but only abnormal results are displayed) Labs Reviewed  BASIC METABOLIC PANEL - Abnormal; Notable for the following components:      Result Value   CO2 19 (*)    Glucose, Bld 202 (*)    BUN 28 (*)    Creatinine, Ser 1.90 (*)    GFR calc non Af Amer 42 (*)    GFR calc Af Amer 48 (*)    All other components within normal limits  CBC - Abnormal; Notable for the following components:   Hemoglobin 12.3 (*)    MCV 73.7 (*)    MCH 23.1 (*)    RDW 16.8 (*)    All other components within normal limits  URINALYSIS, ROUTINE W REFLEX MICROSCOPIC - Abnormal; Notable for the following components:   Hgb urine dipstick SMALL (*)    Protein, ur >=300 (*)    Bacteria, UA RARE (*)    Squamous Epithelial / LPF 0-5 (*)    All other components within normal limits  CBG MONITORING, ED - Abnormal; Notable for the following components:   Glucose-Capillary 184 (*)    All other components within normal limits  I-STAT TROPONIN, ED    EKG EKG Interpretation  Date/Time:  Wednesday December 29 2017 19:23:38 EDT Ventricular Rate:  109 PR Interval:  172 QRS Duration: 100 QT  Interval:  352 QTC Calculation: 474 R Axis:   111 Text  Interpretation:  Sinus tachycardia Biatrial enlargement Left posterior fascicular block Abnormal ECG similar to previous Confirmed by Theotis Burrow [phone]) on 12/29/2017 9:16:28 PM   Radiology Dg Chest Port 1 View  Result Date: 12/29/2017 CLINICAL DATA:  42 year old male with syncope. EXAM: PORTABLE CHEST 1 VIEW COMPARISON:  Chest radiograph dated 06/10/2017 FINDINGS: There is a stable cardiomegaly. There is mild vascular congestion. Bilateral hazy airspace density most consistent with alveolar edema. Pneumonia is not excluded. Clinical correlation is recommended. There is no pleural effusion or pneumothorax. A single lead AICD device. No acute osseous pathology. IMPRESSION: Cardiomegaly with vascular congestion and pulmonary edema. Pneumonia is not excluded. Clinical correlation is recommended. Electronically Signed   By: Anner Crete M.D.   On: 12/29/2017 21:35    Procedures Procedures (including critical care time)  Medications Ordered in ED Medications - No data to display   Initial Impression / Assessment and Plan / ED Course  I have reviewed the triage vital signs and the nursing notes.  Pertinent labs & imaging results that were available during my care of the patient were reviewed by me and considered in my medical decision making (see chart for details).  Clinical Course as of Dec 31 1511  Thu Dec 30, 2017  3419 Basic metabolic panel(!) [KH]  6222 Basic metabolic panel(!) [KH]    Clinical Course User Index [KH] Antonietta Breach, PA-C   Pt comfortable on exam.  EKG shows sinus tachycardia similar to previous.  Labs show negative troponin, creatinine 1.9 which is not significantly elevated from previous.  Chest x-ray with cardiomegaly and mild pulmonary edema. Contacted cardiology and discussed w/ Dr. Teena Dunk.  We are awaiting device interrogation and I anticipate that the patient will be admitted to the cardiology service for further evaluation.  Final Clinical Impressions(s) /  ED Diagnoses   Final diagnoses:  None    ED Discharge Orders    None       Raesean Bartoletti, Wenda Overland, MD 12/29/17 2231    Shaela Boer, Wenda Overland, MD 12/30/17 [phone]

## 2017-12-29 NOTE — ED Notes (Signed)
Pt on 3L [place] 

## 2017-12-29 NOTE — ED Triage Notes (Signed)
Pt reports he was holding his son, had a episode of chest heaviness then woke up on the floor, pt unsure if he passed out or not. Pt has ICD and not sure if it fired or not. Pt has no complaints at this time, just very tearful b/c he dropped his 30 week old during this episode.

## 2017-12-30 ENCOUNTER — Inpatient Hospital Stay (HOSPITAL_COMMUNITY): Payer: Medicaid Other

## 2017-12-30 ENCOUNTER — Encounter (HOSPITAL_COMMUNITY): Payer: Self-pay | Admitting: Nurse Practitioner

## 2017-12-30 ENCOUNTER — Other Ambulatory Visit: Payer: Self-pay

## 2017-12-30 DIAGNOSIS — N183 Chronic kidney disease, stage 3 (moderate): Secondary | ICD-10-CM | POA: Diagnosis present

## 2017-12-30 DIAGNOSIS — Z6841 Body Mass Index (BMI) 40.0 and over, adult: Secondary | ICD-10-CM | POA: Diagnosis not present

## 2017-12-30 DIAGNOSIS — J811 Chronic pulmonary edema: Secondary | ICD-10-CM | POA: Diagnosis not present

## 2017-12-30 DIAGNOSIS — I071 Rheumatic tricuspid insufficiency: Secondary | ICD-10-CM | POA: Diagnosis present

## 2017-12-30 DIAGNOSIS — Z4502 Encounter for adjustment and management of automatic implantable cardiac defibrillator: Secondary | ICD-10-CM | POA: Diagnosis not present

## 2017-12-30 DIAGNOSIS — R55 Syncope and collapse: Secondary | ICD-10-CM | POA: Diagnosis present

## 2017-12-30 DIAGNOSIS — I5023 Acute on chronic systolic (congestive) heart failure: Secondary | ICD-10-CM | POA: Diagnosis present

## 2017-12-30 DIAGNOSIS — M109 Gout, unspecified: Secondary | ICD-10-CM | POA: Diagnosis present

## 2017-12-30 DIAGNOSIS — G4733 Obstructive sleep apnea (adult) (pediatric): Secondary | ICD-10-CM | POA: Diagnosis present

## 2017-12-30 DIAGNOSIS — I472 Ventricular tachycardia, unspecified: Secondary | ICD-10-CM

## 2017-12-30 DIAGNOSIS — Z905 Acquired absence of kidney: Secondary | ICD-10-CM | POA: Diagnosis not present

## 2017-12-30 DIAGNOSIS — Z9119 Patient's noncompliance with other medical treatment and regimen: Secondary | ICD-10-CM | POA: Diagnosis not present

## 2017-12-30 DIAGNOSIS — R042 Hemoptysis: Secondary | ICD-10-CM | POA: Diagnosis present

## 2017-12-30 DIAGNOSIS — Z9111 Patient's noncompliance with dietary regimen: Secondary | ICD-10-CM | POA: Diagnosis not present

## 2017-12-30 DIAGNOSIS — N179 Acute kidney failure, unspecified: Secondary | ICD-10-CM | POA: Diagnosis present

## 2017-12-30 DIAGNOSIS — Z85528 Personal history of other malignant neoplasm of kidney: Secondary | ICD-10-CM | POA: Diagnosis not present

## 2017-12-30 DIAGNOSIS — Z886 Allergy status to analgesic agent status: Secondary | ICD-10-CM | POA: Diagnosis not present

## 2017-12-30 DIAGNOSIS — I429 Cardiomyopathy, unspecified: Secondary | ICD-10-CM | POA: Diagnosis present

## 2017-12-30 DIAGNOSIS — I480 Paroxysmal atrial fibrillation: Secondary | ICD-10-CM

## 2017-12-30 DIAGNOSIS — M25571 Pain in right ankle and joints of right foot: Secondary | ICD-10-CM | POA: Diagnosis present

## 2017-12-30 DIAGNOSIS — E7800 Pure hypercholesterolemia, unspecified: Secondary | ICD-10-CM | POA: Diagnosis present

## 2017-12-30 DIAGNOSIS — I5022 Chronic systolic (congestive) heart failure: Secondary | ICD-10-CM

## 2017-12-30 DIAGNOSIS — R0902 Hypoxemia: Secondary | ICD-10-CM | POA: Diagnosis present

## 2017-12-30 DIAGNOSIS — E1122 Type 2 diabetes mellitus with diabetic chronic kidney disease: Secondary | ICD-10-CM | POA: Diagnosis present

## 2017-12-30 DIAGNOSIS — I130 Hypertensive heart and chronic kidney disease with heart failure and stage 1 through stage 4 chronic kidney disease, or unspecified chronic kidney disease: Secondary | ICD-10-CM | POA: Diagnosis present

## 2017-12-30 LAB — MAGNESIUM
MAGNESIUM: 1.8 mg/dL (ref 1.7–2.4)
Magnesium: 2.2 mg/dL (ref 1.7–2.4)

## 2017-12-30 LAB — BASIC METABOLIC PANEL
ANION GAP: 7 (ref 5–15)
Anion gap: 9 (ref 5–15)
BUN: 28 mg/dL — AB (ref 6–20)
BUN: 26 mg/dL — ABNORMAL HIGH (ref 6–20)
CHLORIDE: 106 mmol/L (ref 101–111)
CO2: 21 mmol/L — AB (ref 22–32)
CO2: 25 mmol/L (ref 22–32)
Calcium: 9.1 mg/dL (ref 8.9–10.3)
Calcium: 9.1 mg/dL (ref 8.9–10.3)
Chloride: 107 mmol/L (ref 101–111)
Creatinine, Ser: 2.02 mg/dL — ABNORMAL HIGH (ref 0.61–1.24)
Creatinine, Ser: 2.02 mg/dL — ABNORMAL HIGH (ref 0.61–1.24)
GFR calc Af Amer: 45 mL/min — ABNORMAL LOW (ref >60)
GFR calc non Af Amer: 39 mL/min — ABNORMAL LOW (ref >60)
GFR, EST AFRICAN AMERICAN: 45 mL/min — AB (ref >60)
GFR, EST NON AFRICAN AMERICAN: 39 mL/min — AB (ref >60)
GLUCOSE: 152 mg/dL — AB (ref 65–99)
Glucose, Bld: 177 mg/dL — ABNORMAL HIGH (ref 65–99)
POTASSIUM: 3.9 mmol/L (ref 3.5–5.1)
Potassium: 4.3 mmol/L (ref 3.5–5.1)
SODIUM: 138 mmol/L (ref 135–145)
Sodium: 137 mmol/L (ref 135–145)

## 2017-12-30 LAB — GLUCOSE, CAPILLARY: GLUCOSE-CAPILLARY: 147 mg/dL — AB (ref 65–99)

## 2017-12-30 LAB — MRSA PCR SCREENING: MRSA by PCR: NEGATIVE

## 2017-12-30 LAB — BRAIN NATRIURETIC PEPTIDE
B Natriuretic Peptide: 973.4 pg/mL — ABNORMAL HIGH (ref 0.0–100.0)
B Natriuretic Peptide: 1177.3 pg/mL — ABNORMAL HIGH (ref 0.0–100.0)

## 2017-12-30 MED ORDER — AMIODARONE HCL 200 MG PO TABS
200.0000 mg | ORAL_TABLET | Freq: Every day | ORAL | Status: DC
  Administered 2017-12-30: 200 mg via ORAL
  Filled 2017-12-30: qty 1

## 2017-12-30 MED ORDER — ISOSORBIDE MONONITRATE ER 30 MG PO TB24
30.0000 mg | ORAL_TABLET | Freq: Every day | ORAL | Status: DC
  Administered 2017-12-30 – 2018-01-01 (×3): 30 mg via ORAL
  Filled 2017-12-30 (×3): qty 1

## 2017-12-30 MED ORDER — ACETAMINOPHEN 325 MG PO TABS
650.0000 mg | ORAL_TABLET | ORAL | Status: DC | PRN
  Administered 2017-12-30: 650 mg via ORAL
  Filled 2017-12-30: qty 2

## 2017-12-30 MED ORDER — SODIUM CHLORIDE 0.9% FLUSH
3.0000 mL | Freq: Two times a day (BID) | INTRAVENOUS | Status: DC
  Administered 2017-12-30 – 2018-01-01 (×6): 3 mL via INTRAVENOUS

## 2017-12-30 MED ORDER — FUROSEMIDE 10 MG/ML IJ SOLN
40.0000 mg | Freq: Once | INTRAMUSCULAR | Status: AC
  Administered 2017-12-30: 40 mg via INTRAVENOUS
  Filled 2017-12-30: qty 4

## 2017-12-30 MED ORDER — SACUBITRIL-VALSARTAN 49-51 MG PO TABS
1.0000 | ORAL_TABLET | Freq: Two times a day (BID) | ORAL | Status: DC
  Administered 2017-12-30: 1 via ORAL
  Filled 2017-12-30 (×2): qty 1

## 2017-12-30 MED ORDER — CYCLOBENZAPRINE HCL 10 MG PO TABS
5.0000 mg | ORAL_TABLET | Freq: Three times a day (TID) | ORAL | Status: DC | PRN
  Administered 2017-12-30 – 2018-01-01 (×4): 5 mg via ORAL
  Filled 2017-12-30 (×5): qty 1

## 2017-12-30 MED ORDER — ATORVASTATIN CALCIUM 20 MG PO TABS
20.0000 mg | ORAL_TABLET | Freq: Every day | ORAL | Status: DC
  Administered 2017-12-30 – 2017-12-31 (×2): 20 mg via ORAL
  Filled 2017-12-30: qty 1

## 2017-12-30 MED ORDER — SACUBITRIL-VALSARTAN 97-103 MG PO TABS: 1.0000 | ORAL_TABLET | Freq: Two times a day (BID) | ORAL | Status: DC

## 2017-12-30 MED ORDER — AMIODARONE HCL 200 MG PO TABS
200.0000 mg | ORAL_TABLET | Freq: Two times a day (BID) | ORAL | Status: AC
  Administered 2017-12-30: 200 mg via ORAL
  Filled 2017-12-30: qty 1

## 2017-12-30 MED ORDER — APIXABAN 5 MG PO TABS
5.0000 mg | ORAL_TABLET | Freq: Two times a day (BID) | ORAL | Status: DC
  Administered 2017-12-30 – 2018-01-01 (×5): 5 mg via ORAL
  Filled 2017-12-30 (×6): qty 1

## 2017-12-30 MED ORDER — HYDRALAZINE HCL 50 MG PO TABS: 100.0000 mg | ORAL_TABLET | Freq: Three times a day (TID) | ORAL | Status: DC

## 2017-12-30 MED ORDER — SODIUM CHLORIDE 0.9 % IV SOLN: 250.0000 mL | INTRAVENOUS | Status: DC | PRN

## 2017-12-30 MED ORDER — AMIODARONE HCL 200 MG PO TABS
400.0000 mg | ORAL_TABLET | Freq: Every day | ORAL | Status: DC
  Administered 2017-12-31 – 2018-01-01 (×2): 400 mg via ORAL
  Filled 2017-12-30 (×2): qty 2

## 2017-12-30 MED ORDER — ACETAMINOPHEN-CODEINE #3 300-30 MG PO TABS
1.0000 | ORAL_TABLET | Freq: Four times a day (QID) | ORAL | Status: DC | PRN
  Administered 2017-12-30 – 2017-12-31 (×5): 1 via ORAL
  Filled 2017-12-30 (×5): qty 1

## 2017-12-30 MED ORDER — COLCHICINE 0.6 MG PO TABS
0.6000 mg | ORAL_TABLET | Freq: Two times a day (BID) | ORAL | Status: DC
  Administered 2017-12-30 – 2018-01-01 (×4): 0.6 mg via ORAL
  Filled 2017-12-30 (×7): qty 1

## 2017-12-30 MED ORDER — SODIUM CHLORIDE 0.9% FLUSH: 3.0000 mL | INTRAVENOUS | Status: DC | PRN

## 2017-12-30 MED ORDER — GABAPENTIN 300 MG PO CAPS
300.0000 mg | ORAL_CAPSULE | Freq: Three times a day (TID) | ORAL | Status: DC
  Administered 2017-12-30 – 2018-01-01 (×7): 300 mg via ORAL
  Filled 2017-12-30 (×7): qty 1

## 2017-12-30 MED ORDER — TORSEMIDE 20 MG PO TABS: 20.0000 mg | ORAL_TABLET | Freq: Every day | ORAL | Status: DC

## 2017-12-30 MED ORDER — FUROSEMIDE 10 MG/ML IJ SOLN
40.0000 mg | Freq: Two times a day (BID) | INTRAMUSCULAR | Status: DC
  Administered 2017-12-30: 40 mg via INTRAVENOUS
  Filled 2017-12-30: qty 4

## 2017-12-30 MED ORDER — TORSEMIDE 20 MG PO TABS
20.0000 mg | ORAL_TABLET | Freq: Once | ORAL | Status: AC
  Administered 2017-12-30: 20 mg via ORAL
  Filled 2017-12-30: qty 1

## 2017-12-30 MED ORDER — ONDANSETRON HCL 4 MG/2ML IJ SOLN: 4.0000 mg | Freq: Four times a day (QID) | INTRAMUSCULAR | Status: DC | PRN

## 2017-12-30 MED ORDER — LEVALBUTEROL HCL 0.63 MG/3ML IN NEBU
0.6300 mg | INHALATION_SOLUTION | Freq: Three times a day (TID) | RESPIRATORY_TRACT | Status: DC | PRN
  Administered 2017-12-30: 0.63 mg via RESPIRATORY_TRACT
  Filled 2017-12-30: qty 3

## 2017-12-30 MED ORDER — MAGNESIUM SULFATE 2 GM/50ML IV SOLN
2.0000 g | INTRAVENOUS | Status: AC
  Administered 2017-12-30: 2 g via INTRAVENOUS
  Filled 2017-12-30: qty 50

## 2017-12-30 MED ORDER — ALLOPURINOL 100 MG PO TABS
100.0000 mg | ORAL_TABLET | Freq: Every day | ORAL | Status: DC
  Administered 2017-12-30 – 2018-01-01 (×3): 100 mg via ORAL
  Filled 2017-12-30 (×3): qty 1

## 2017-12-30 NOTE — ED Provider Notes (Signed)
12:45 AM Patient's [employer] defibrillator has been interrogated.  This shows a 14-second episode of sustained ventricular tachycardia.  Will consult with cardiology fellow regarding admission.  12:48 AM Patient assessed.  He states that he is feeling short of breath.  He has no chronic oxygen requirement at home, currently on 3 L oxygen via nasal cannula with sats of 90-92%.  Chest x-ray today does reveal vascular congestion and pulmonary edema.  Patient states that he has not taken his home torsemide and he is feeling similar to when he has too much fluid in his lungs.  He states that he coughs when this occurs and he has been coughing since arrival.  1:13 AM Spoke with Dr. Teena Dunk of cardiology who will admit the patient.  Recommends adding magnesium level as well as BNP.  Will give oral dose of daily torsemide as well as IV magnesium, per discussion with cardiology.  See below for interrogation results.          Antonietta Breach, PA-C 12/30/17 0114    Ward, Delice Bison, DO 12/30/17 0230

## 2017-12-30 NOTE — Progress Notes (Signed)
Placed patient on CPAP for the night via auto-mode with minimum pressure set at 8cm and maximum pressure set at 20cm. Oxygen was set at 6lpm with Sp02=95%

## 2017-12-30 NOTE — H&P (Signed)
Cardiology Admission History and Physical:   Patient ID: Jeffrey [name]; MRN: [id number]; DOB: January 25, 1976   Admission date: 12/29/2017  Primary Care Provider: Scot Jun, FNP Primary Cardiologist: [facility] Primary Electrophysiologist:    Chief Complaint:  ICD  SHOCK   Patient Profile:   JAMONTE CURFMAN is a 42 y.o. male with a history of  NICM , admitted  With  CHF  Exacerbation, ICD  Shock  And  VT on ICD  Interrogation.  History of Present Illness:   Mr. Banke has NICM seen in [facility]. with a history of  NICM , admitted  With  CHF  Exacerbation, ICD  Shock  And  VT on ICD  Interrogation.    42 y.o. 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    Admitted to [facility] in 3/16 secondary to atypical chest pain, dyspnea, and mild troponin elevation. Echo during hospitalization shows "severely reduced LV function" - EF not quantified due to poor windows. He was diuresed and was down about 7 pounds at discharge, at 289 on the hospital scale.   Admitted 05/14/15 with CP at rest. CXR looked improved from previous. CP resolved spontaneously after hospitalization and troponin was flat. R/LHC showed normal coronaries and moderately elevated filling pressures with moderately depressed cardiac output. He had been previously seen by EP and approved for ICD with chronic low EF. [place] subcutaneous ICD was implanted 05/17/15.  He was diuresed with IV lasix 40 mg. Discharge weight 290 lb.  Admitted 4/11 through 01/16/16 with volume overload in the setting of missed medications. Diuresed with IV lasix and transitioned to torsemide 20 mg daily. Discharge weight was 293 pounds.   Admitted 9/4 - 03/13/66 with A/C systolic CHF in the setting of new afib. Started on IV lasix and amiodarone. Pt diuresed and converted to NSR without requiring  DCCV. Started on Eliquis for [facility]. Continued on po amio on discharge with wean over several weeks.  ECHO 01/16/2016 EF 20-25%  Grade III DD Echo 05/07/15.  EF 20-25%, grade II diastolic dysfunction, diffuse hypokinesis, moderate LV dilation, mild LVH, normal RV size with mildly decreased systolic function.   Echo in 2012 with EF 20-25%.  In 1/13 had nuclear study EF 45-50% suggestive of NICM.   [facility] 05/17/15 RA = 7 RV = 42/3/7 PA = 43/30 (39) PCW = 26 Fick cardiac output/index = 4.9/2.1 PVR = 2.7 WU SVR = 1651 FA sat = 95% PA sat = 65%, 65% Ao = 122/97 (109) LV = 110/31/33 1) Normal coronaries 2) Moderately elevated filling pressures with moderately depressed cardiac output      Past Medical History:  Diagnosis Date  . Anxiety   . Aspirin allergy   . Childhood asthma   . Chronic systolic CHF (congestive heart failure) ([place])    a. EF 20-25% in 2012. b. EF 45-50% in 10/2011 with nonischemic nuc - presumed NICM. c. 12/2014 Echo: Sev depressed LV fxn, sev dil LV, mild LVH, mild MR, sev dil LA, mildly reduced RV fxn.  . CKD (chronic kidney disease) stage 2, GFR 60-89 ml/min   . High cholesterol   . Hypertension   . Morbid obesity ([place])   . Nephrolithiasis   . OSA on CPAP   . Presumed NICM    a. 04/2014 Myoview: EF 26%, glob HK, sev glob HK, ? prior infarct;  b. Never cathed 2/2 CKD.  [name] Renal cell carcinoma ([place])    a. s/p Rt robotic assisted partial converted to radical nephrectomy on 01/2013.  . Troponin level elevated    a. 04/2014, 12/2014: felt due to CHF.  . Type II diabetes mellitus ([place])     Past Surgical History:  Procedure Laterality Date  . APPENDECTOMY  07/2004  . CARDIAC CATHETERIZATION N/A 05/17/2015   Procedure: Right/Left Heart Cath and Coronary Angiography;  Surgeon: Jolaine Artist, MD;  Location: [place] CV LAB;  Service: Cardiovascular;  Laterality: N/A;  . EP IMPLANTABLE DEVICE N/A 05/17/2015   Procedure: SubQ ICD Implant;  Surgeon: Will Meredith Leeds,  MD;  Location: [place] CV LAB;  Service: Cardiovascular;  Laterality: N/A;  . ROBOTIC ASSISTED LAPAROSCOPIC LYSIS OF ADHESION  01/13/2013   Procedure: ROBOTIC ASSISTED LAPAROSCOPIC LYSIS OF ADHESION EXTENSIVE;  Surgeon: Alexis Frock, MD;  Location: [facility];  Service: Urology;;  . ROBOTIC ASSITED PARTIAL NEPHRECTOMY Right 01/13/2013   Procedure: ROBOTIC ASSITED PARTIAL NEPHRECTOMY CONVERTED TO ROBOTIC ASSISTED RIGHT RADICAL NEPHRECTOMY;  Surgeon: Alexis Frock, MD;  Location: [facility];  Service: Urology;  Laterality: Right;     Medications Prior to Admission: Prior to Admission medications   Medication Sig Start Date End Date Taking? Authorizing Provider  acetaminophen-codeine (TYLENOL #3) 300-30 MG tablet Take 1 tablet by mouth every 6 (six) hours as needed for moderate pain. 11/01/17  Yes Scot Jun, FNP  allopurinol (ZYLOPRIM) 100 MG tablet Take 1 tablet (100 mg total) by mouth daily. 02/17/17  Yes Tresa Garter, MD  amiodarone (PACERONE) 200 MG tablet Take 1 tablet (200 mg total) by mouth daily. 06/28/17  Yes Shirley Friar, PA-C  apixaban (ELIQUIS) 5 MG TABS tablet Take 1 tablet (5 mg total) by mouth 2 (two) times daily. 06/13/17  Yes Simmons, Brittainy M, PA-C  atorvastatin (LIPITOR) 80 MG tablet TAKE 1 TABLET (80 MG TOTAL) BY MOUTH EVERY EVENING. 02/17/17  Yes Jegede, Olugbemiga E, MD  colchicine 0.6 MG tablet Take 1 tablet (0.6 mg total) by mouth 2 (two) times daily. Patient taking differently: Take 0.6 mg by mouth 2 (two) times daily as needed (gout).  02/17/17  Yes Tresa Garter, MD  cyclobenzaprine (FLEXERIL) 5 MG tablet Take 1 tablet (5 mg total) 3 (three) times daily as needed by mouth for muscle spasms. 08/22/17  Yes Lovenia Kim, MD  gabapentin (NEURONTIN) 300 MG capsule Take 1 capsule (300 mg total) by mouth 3 (three) times daily. 11/01/17  Yes Scot Jun, FNP  hydrALAZINE (APRESOLINE) 100 MG tablet Take 1 tablet (100 mg total) by mouth 3 (three)  times daily. 07/22/17  Yes Shirley Friar, PA-C  insulin NPH-regular Human (NOVOLIN 70/30) (70-30) 100 UNIT/ML injection Inject 34 Units into the skin 2 (two) times daily with a meal. 07/09/17  Yes Jegede, Olugbemiga E, MD  isosorbide mononitrate (IMDUR) 30 MG 24 hr tablet Take 1 tablet (30 mg total) by mouth daily. 06/23/17  Yes Shirley Friar, PA-C  Magnesium Oxide 200 MG TABS Take 1 tablet (200 mg total) by mouth daily. Patient taking differently: Take 100 mg by mouth daily.  01/19/17  Yes Shirley Friar, PA-C  meclizine (ANTIVERT) 25 MG tablet Take 1 tablet (25 mg total) by mouth 3 (three) times daily as needed for dizziness (Take meclizine for spinning dizziness. You may also take Valium if your symptoms are not relieved by the meclizine alone.). 02/11/16  Yes [name], MD  sacubitril-valsartan Delene Loll)  97-103 MG Take 1 tablet by mouth 2 (two) times daily. 09/09/17  Yes Tresa Garter, MD  spironolactone (ALDACTONE) 25 MG tablet Take 1 tablet (25 mg total) by mouth daily. 06/23/17  Yes Shirley Friar, PA-C  torsemide (DEMADEX) 20 MG tablet Take 1 tablet (20 mg total) by mouth daily. 06/23/17  Yes Shirley Friar, PA-C  traMADol (ULTRAM) 50 MG tablet Take 1 tablet (50 mg total) by mouth every 6 (six) hours as needed. Patient taking differently: Take 50 mg by mouth every 6 (six) hours as needed for moderate pain.  06/05/17  Yes Lajean Saver, MD  cetirizine (ZYRTEC ALLERGY) 10 MG tablet Take 1 tablet (10 mg total) by mouth daily. 06/30/12 09/21/12  [name], PA-C     Allergies:    Allergies  Allergen Reactions  . Aspirin Shortness Of Breath, Itching and Rash     Burning sensation (Patient reports he tolerates other NSAIDS)   . Bee Venom Hives and Swelling  . Lisinopril Cough  . Tomato Rash    Social History:   Social History   Socioeconomic History  . Marital status: Significant Other    Spouse name: Not on file  . Number of  children: Not on file  . Years of education: Not on file  . Highest education level: Not on file  Occupational History  . Not on file  Social Needs  . Financial resource strain: Not on file  . Food insecurity:    Worry: Not on file    Inability: Not on file  . Transportation needs:    Medical: Not on file    Non-medical: Not on file  Tobacco Use  . Smoking status: Former Smoker    Packs/day: 0.25    Years: 22.00    Pack years: 5.50    Last attempt to quit: 04/16/2015    Years since quitting: 2.7  . Smokeless tobacco: Never Used  Substance and Sexual Activity  . Alcohol use: No    Comment: occ   . Drug use: Yes    Frequency: 14.0 times per week    Types: Marijuana    Comment: uses marijuana twice a day  . Sexual activity: Yes    Birth control/protection: Condom  Lifestyle  . Physical activity:    Days per week: Not on file    Minutes per session: Not on file  . Stress: Not on file  Relationships  . Social connections:    Talks on phone: Not on file    Gets together: Not on file    Attends religious service: Not on file    Active member of club or organization: Not on file    Attends meetings of clubs or organizations: Not on file    Relationship status: Not on file  . Intimate partner violence:    Fear of current or ex partner: Not on file    Emotionally abused: Not on file    Physically abused: Not on file    Forced sexual activity: Not on file  Other Topics Concern  . Not on file  Social History Narrative  . Not on file    Family History:   The patient's family history includes Diabetes in his maternal aunt; Hypertension in his mother. There is no history of Heart attack or Stroke.    ROS:  Please see the history of present illness.  All other ROS reviewed and negative.  EXCEPT  CHEST  DISCOMFORT  ICD  Shock  Physical Exam/Data:   Vitals:   12/29/17 1925 12/29/17 2100 12/29/17 2230 12/29/17 2330  BP: (!) 139/104 (!) 116/99 (!) 124/94 116/84  Pulse:  (!) 110 (!) 104 (!) 102 100  Resp: 18 (!) 24 (!) 21 13  Temp: 98.7 F (37.1 C)     TempSrc: Oral     SpO2: 96% 94% 93% 91%  Weight: (!) 330 lb (149.7 kg)     Height: 5\' 7"  (1.702 m)      No intake or output data in the 24 hours ending 12/30/17 0112 Filed Weights   12/29/17 1925  Weight: (!) 330 lb (149.7 kg)   Body mass index is 51.69 kg/m.    Physical Exam General: Well appearing. No resp difficulty. HEENT: Normal  anicteric Neck: Supple. JVP 5-6. Carotids 2+ bilat; no bruits. No thyromegaly or nodule noted. Cor: PMI laterally displaced. RRR, No M/G/R noted Lungs: CTAB, normal effort. Abdomen: Obese Soft, non-tender, non-distended, no HSM. No bruits or masses. +BS  Extremities: No cyanosis, clubbing, or rash. R and LLE no edema.  Neuro: Alert & orientedx3, cranial nerves grossly intact. moves all 4 extremities w/o difficulty. Affect pleasant     EKG:  The ECG that was done /ICD  Interrogation  [place] [facility]  3/27- ICD SHOCK  After  VT  Relevant CV Studies: dmitted 9/4 - 02/08/25 with A/C systolic CHF in the setting of new afib. Started on IV lasix and amiodarone. Pt diuresed and converted to NSR without requiring DCCV. Started on Eliquis for [facility]. Continued on po amio on discharge with wean over several weeks.  ECHO 01/16/2016 EF 20-25%  Grade III DD Echo 05/07/15.  EF 20-25%, grade II diastolic dysfunction, diffuse hypokinesis, moderate LV dilation, mild LVH, normal RV size with mildly decreased systolic function.   Echo in 2012 with EF 20-25%.  In 1/13 had nuclear study EF 45-50% suggestive of NICM.   [facility] 05/17/15 RA = 7 RV = 42/3/7 PA = 43/30 (39) PCW = 26 Fick cardiac output/index = 4.9/2.1 PVR = 2.7 WU SVR = 1651 FA sat = 95% PA sat = 65%, 65% Ao = 122/97 (109) LV = 110/31/33 1) Normal coronaries 2) Moderately elevated filling pressures with moderately depressed cardiac output       Laboratory Data:  Chemistry Recent Labs  Lab 12/29/17 1931  NA 136  K 3.9   CL 107  CO2 19*  GLUCOSE 202*  BUN 28*  CREATININE 1.90*  CALCIUM 9.2  GFRNONAA 42*  GFRAA 48*  ANIONGAP 10    No results for input(s): PROT, ALBUMIN, AST, ALT, ALKPHOS, BILITOT in the last 168 hours. Hematology Recent Labs  Lab 12/29/17 1931  WBC 10.1  RBC 5.32  HGB 12.3*  HCT 39.2  MCV 73.7*  MCH 23.1*  MCHC 31.4  RDW 16.8*  PLT 337   Cardiac EnzymesNo results for input(s): TROPONINI in the last 168 hours.  Recent Labs  Lab 12/29/17 2101  TROPIPOC 0.04    BNPNo results for input(s): BNP, PROBNP in the last 168 hours.  DDimer No results for input(s): DDIMER in the last 168 hours.  Radiology/Studies:  Dg Chest Port 1 View  Result Date: 12/29/2017 CLINICAL DATA:  42 year old male with syncope. EXAM: PORTABLE CHEST 1 VIEW COMPARISON:  Chest radiograph dated 06/10/2017 FINDINGS: There is a stable cardiomegaly. There is mild vascular congestion. Bilateral hazy airspace density most consistent with alveolar edema. Pneumonia is not excluded. Clinical correlation is recommended. There is no pleural effusion or pneumothorax. A  single lead AICD device. No acute osseous pathology. IMPRESSION: Cardiomegaly with vascular congestion and pulmonary edema. Pneumonia is not excluded. Clinical correlation is recommended. Electronically Signed   By: Anner Crete M.D.   On: 12/29/2017 21:35    Assessment and Plan:   1. VENTRICULAR  TACHYCARDIA  , followed by ICD  Shock  2. CHF , systolic acute on chronic  Exacerbation    Chronic systolic congestive heart failure: s/p [employer] subcutaneous ICD implant 05/17/15.  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 Echo 01/2016  EF 20-25%.  - Echo 06/02/17 EF 20-25% - NYHA II symptoms chronically.  - Continue torsemide 20 mg daily.  Has not needed any extra. IV lasix  20 mg IV bid  - Intresto 49/51 mg BID.   Will need  careful with titration in setting of solitary kidney - Continue hydralazine 100 mg TID.  -Imdur 30 mg daily. On hold  due to BP issues, re  eval use  Again based on BP .  - Continue spironolactone 25 mg daily  - Reinforced fluid restriction to < 2 L daily, sodium restriction to less than 2000 mg daily, and the importance of daily weights.    2. Paroxysmal Afib - New diagnosis this 06/2017.  - Now on Eliquis  - Continue amiodarone 200 mg daily. 2. Hypertension:  - Meds as above.   3. CKD stage III - BMET today. Creatinine 1.48 08/22/17 4. OSA:  - Not using CPAP. CPAP machine broken. Have again asked patient to follow up [facility] regarding CPAP equipment repair.  5. Tobacco use: - Quit 1 year ago. No change.  6. Gout:  - Continue allopurinol 200 daily for prophylaxis. No change.  7. DM2:  - Stable. Per PCP.  8.Renal Cell Carcinoma - S/P nephrectomy 2014.  - Follow renal function carefully in setting of solitary kidney.  9. Morbid Obesity        Severity of Illness: The appropriate patient status for this patient is OBSERVATION. Observation status is judged to be reasonable and necessary in order to provide the required intensity of service to ensure the patient's safety. The patient's presenting symptoms, physical exam findings, and initial radiographic and laboratory data in the context of their medical condition is felt to place them at decreased risk for further clinical deterioration. Furthermore, it is anticipated that the patient will be medically stable for discharge from the hospital within 2 midnights of admission. The following factors support the patient status of observation.   " The patient's presenting symptoms include vt. " The physical exam findings include CHF  exacerbation " The initial radiographic and laboratory data are cardiomegal;y, congestion     For questions or updates, please contact [place] Please consult [email or web address] for contact info under Cardiology/STEMI.    Signed, Johna Sheriff, MD  12/30/2017 1:12 AM

## 2017-12-30 NOTE — Care Management Note (Signed)
Case Management Note  Patient Details  Name: Jeffrey [name] MRN: [id number] Date of Birth: September 10, 1976  Subjective/Objective:     Pt admitted with CP secondary to overload               Action/Plan:     PTA independent from home with family.  Pt confirmed he is active with [facility] clinic and gets his medications filled at the [facility] pharmacy.  Pt informed CM that he weighs daily and adheres to low salt diet.     Expected Discharge Date:  (unknown)               Expected Discharge Plan:  Home/Self Care  In-House Referral:     Discharge planning Services  CM Consult  Post Acute Care Choice:    Choice offered to:     DME Arranged:    DME Agency:     HH Arranged:    HH Agency:     Status of Service:  In process, will continue to follow  If discussed at Long Length of Stay Meetings, dates discussed:    Additional Comments:  Maryclare Labrador, RN 12/30/2017, 3:10 PM

## 2017-12-30 NOTE — ED Notes (Signed)
Patient denies pain and is resting comfortably.  

## 2017-12-30 NOTE — Progress Notes (Addendum)
Advanced Heart Failure Rounding Note  PCP-Cardiologist: Glori Bickers, MD   Subjective:    Admitted 12/29/17 with syncope. ICD showed 14 sec of VT with shock. Pt c/o 2 days of intermittent chest tightness leading up to it, but felt as if he had fluid overload.  Denies any significant, exertional CP.   Feeling better this am. Significant UOP noted with 40 mg IV lasix. Denies exertional CP. No lightheadedness or dizziness leading up to event. No recent illness. Weight has been trending up over past several weeks.   Creatinine up to 2.02. K 3.9. BNP 1177. WBC 10.1. Hgb 12.3  Objective:   Weight Range: (!) 330 lb (149.7 kg) Body mass index is 51.69 kg/m.   Vital Signs:   Temp:  [98.7 F (37.1 C)] 98.7 F (37.1 C) (03/27 1925) Pulse Rate:  [92-110] 98 (03/28 0630) Resp:  [13-43] 36 (03/28 0700) BP: (103-139)/(72-104) 103/76 (03/28 0700) SpO2:  [86 %-99 %] 99 % (03/28 0630) Weight:  [330 lb (149.7 kg)] 330 lb (149.7 kg) (03/27 1925)   Weight change: Filed Weights   12/29/17 1925  Weight: (!) 330 lb (149.7 kg)   Intake/Output:  No intake or output data in the 24 hours ending 12/30/17 0729   Physical Exam    General:  Well appearing. NAD.  HEENT: Normal Neck: Supple. JVP hard to assess with size . Carotids 2+ bilat; no bruits. No lymphadenopathy or thyromegaly appreciated. Cor: PMI nondisplaced. Regular rate & rhythm. No rubs, gallops or murmurs. Lungs: Clear Abdomen: Obese, soft, nontender, nondistended. No hepatosplenomegaly. No bruits or masses. Good bowel sounds. Extremities: No cyanosis, clubbing, or rash. Trace ankle edema.  Neuro: Alert & orientedx3, cranial nerves grossly intact. moves all 4 extremities w/o difficulty. Affect pleasant  Telemetry   NSR/ST 90-100s, personally reviewed.   EKG    Sinus tach 108 12/29/17, personally reviewed.   Labs    CBC Recent Labs    12/29/17 1931  WBC 10.1  HGB 12.3*  HCT 39.2  MCV 73.7*  PLT 009   Basic  Metabolic Panel Recent Labs    12/29/17 1931 12/30/17 0432  NA 136 137  K 3.9 3.9  CL 107 107  CO2 19* 21*  GLUCOSE 202* 152*  BUN 28* 28*  CREATININE 1.90* 2.02*  CALCIUM 9.2 9.1  MG 1.8 2.2   Liver Function Tests No results for input(s): AST, ALT, ALKPHOS, BILITOT, PROT, ALBUMIN in the last 72 hours. No results for input(s): LIPASE, AMYLASE in the last 72 hours. Cardiac Enzymes No results for input(s): CKTOTAL, CKMB, CKMBINDEX, TROPONINI in the last 72 hours.  BNP: BNP (last 3 results) Recent Labs    06/09/17 1538 12/29/17 1931 12/30/17 0432  BNP 1,110.6* 973.4* 1,177.3*    ProBNP (last 3 results) No results for input(s): PROBNP in the last 8760 hours.   D-Dimer No results for input(s): DDIMER in the last 72 hours. Hemoglobin A1C No results for input(s): HGBA1C in the last 72 hours. Fasting Lipid Panel No results for input(s): CHOL, HDL, LDLCALC, TRIG, CHOLHDL, LDLDIRECT in the last 72 hours. Thyroid Function Tests No results for input(s): TSH, T4TOTAL, T3FREE, THYROIDAB in the last 72 hours.  Invalid input(s): FREET3  Other results:   Imaging    Dg Chest Port 1 View  Result Date: 12/29/2017 CLINICAL DATA:  42 year old male with syncope. EXAM: PORTABLE CHEST 1 VIEW COMPARISON:  Chest radiograph dated 06/10/2017 FINDINGS: There is a stable cardiomegaly. There is mild vascular congestion. Bilateral hazy airspace  density most consistent with alveolar edema. Pneumonia is not excluded. Clinical correlation is recommended. There is no pleural effusion or pneumothorax. A single lead AICD device. No acute osseous pathology. IMPRESSION: Cardiomegaly with vascular congestion and pulmonary edema. Pneumonia is not excluded. Clinical correlation is recommended. Electronically Signed   By: Anner Crete M.D.   On: 12/29/2017 21:35      Medications:     Scheduled Medications: . allopurinol  100 mg Oral Daily  . amiodarone  200 mg Oral Daily  . apixaban  5 mg  Oral BID  . atorvastatin  20 mg Oral q1800  . colchicine  0.6 mg Oral BID  . furosemide  40 mg Intravenous Q12H  . gabapentin  300 mg Oral TID  . hydrALAZINE  100 mg Oral TID  . sacubitril-valsartan  1 tablet Oral BID  . sodium chloride flush  3 mL Intravenous Q12H  . torsemide  20 mg Oral Daily     Infusions: . sodium chloride       PRN Medications:  sodium chloride, acetaminophen, acetaminophen-codeine, cyclobenzaprine, ondansetron (ZOFRAN) IV, sodium chloride flush  Patient Profile   Jeffrey [name] is a 42 y.o. male with a history of  NICM , admitted  With  CHF exacerbation. VT with ICD shock noted on ICD interrogation.   Assessment/Plan   1. VT with ICD shock - [employer] ICD interrogation in chart - Pt had 14 second episode of VT with shock ~1841. - K 3.9 and Mg 2.2 on admit - Initial troponin 0.04. - No driving until September 28th, 2019.  2. Acute on chronic systolic CHF - Volume status elevated. - Got dose of IV lasix 40 mg this am at 0300 with great response. Will repeat dose at 0900. Possibly home later today.  - Hold hydralazine 100 mg TID for now. May need to decrease for discharge.  - Continue imdur 30 mg daily - Hold Entresto for now with AKI.  - Hold spiro for now. Will need to decide on discharge if we will continue.  - Last cath 05/2015 with normal coronaries  3. PAF - EKG shows Sinus tach.  - Continue Eliquis 5 mg BID. No bleeding.   4. AKI on CKD III - UA with > 300 protein  5. OSA - Machine broken. Have asked patient to follow up with [facility] regarding repair  6. DM2 - SSI  7. RCC s/p nephrectomy 2014 - Follow creatinine closely  8. Morbid Obesity - Body mass index is 51.69 kg/m.  - Have stressed importance of weight loss.   Will ask EP to see. Pt knows he can't drive for 6 months.  Possibly home later today.   Medication concerns reviewed with patient and pharmacy team. Barriers identified: None at this time.   Length of Stay:  0  Annamaria Helling  12/30/2017, 7:29 AM  Advanced Heart Failure Team Pager [phone] (M-F; 7a - 4p)  Please contact [facility] for night-coverage after hours (4p -7a ) and weekends on [email or web address]  Patient seen and examined with the above-signed Advanced Practice Provider and/or Housestaff. I personally reviewed laboratory data, imaging studies and relevant notes. I independently examined the patient and formulated the important aspects of the plan. I have edited the note to reflect any of my changes or salient points. I have personally discussed the plan with the patient and/or family.  Agree with above. 42 y/o male with NICM admitted with VT and ICD shock. Also evidence of volume overload and  AKI. Will start amio and ask EP to see. Will continue IV diuresis for today watching renal function closely. Supp electrolytes as needed. Hopefully can go home later today with close f/u. BP soft. Would hold hydralazine for now. Continue Entresto if creatinine not up further. Needs f/u with Nephrology.   Glori Bickers, MD  8:59 AM

## 2017-12-30 NOTE — Consult Note (Addendum)
ELECTROPHYSIOLOGY CONSULT NOTE    Patient ID: Jeffrey [name] MRN: [id number], DOB/AGE: January 01, 1976 42 y.o.  Admit date: 12/29/2017 Date of Consult: 12/30/2017  Primary Physician: Scot Jun, FNP Primary Cardiologist: [place] Electrophysiologist: Caryl Comes  Patient Profile: Jeffrey [name] is a 42 y.o. male with a history of NICM, paroxysmal atrial fibrillation, CKD stage III, OSA non compliant with CPAP who is being seen today for the evaluation of VT at the request of Dr [name].  HPI:  Jeffrey [name] is a 42 y.o. male with the above past medical history. He presented to the hospital with a 2 day history of chest tightness and a feeling of being fluid overloaded. On the day of admission, he had a syncopal spell that correlates with appropriate ICD therapy. He was sitting on the couch holding his 79 month old son when he had sudden syncope. His wife heard him yell and came back into the room.  EP has been asked to evaluate for treatment options. He has been on amiodarone 200mg  daily for treatment of AF. This is his first appropriate ICD therapy.  He has ongoing shortness of breath this morning. His mom who is with him today says that he eats out a lot.   He denies chest pain, palpitations, PND, orthopnea, nausea, vomiting, dizziness, edema, weight gain, or early satiety.  Past Medical History:  Diagnosis Date  . Anxiety   . Aspirin allergy   . Childhood asthma   . Chronic systolic CHF (congestive heart failure) ([place])    a. EF 20-25% in 2012. b. EF 45-50% in 10/2011 with nonischemic nuc - presumed NICM. c. 12/2014 Echo: Sev depressed LV fxn, sev dil LV, mild LVH, mild MR, sev dil LA, mildly reduced RV fxn.  . CKD (chronic kidney disease) stage 2, GFR 60-89 ml/min   . High cholesterol   . Hypertension   . Morbid obesity ([place])   . Nephrolithiasis   . OSA on CPAP   . Presumed NICM    a. 04/2014 Myoview: EF 26%, glob HK, sev glob HK, ? prior infarct;  b. Never cathed 2/2 CKD.  [name] Renal cell  carcinoma ([place])    a. s/p Rt robotic assisted partial converted to radical nephrectomy on 01/2013.  . Troponin level elevated    a. 04/2014, 12/2014: felt due to CHF.  . Type II diabetes mellitus ([place])   . Ventricular tachycardia ([place])    a. appropriate ICD therapy 12/2017     Surgical History:  Past Surgical History:  Procedure Laterality Date  . APPENDECTOMY  07/2004  . CARDIAC CATHETERIZATION N/A 05/17/2015   Procedure: Right/Left Heart Cath and Coronary Angiography;  Surgeon: Jolaine Artist, MD;  Location: [facility];  Service: Cardiovascular;  Laterality: N/A;  . EP IMPLANTABLE DEVICE N/A 05/17/2015   Procedure: SubQ ICD Implant;  Surgeon: Will Meredith Leeds, MD;  Location: [facility];  Service: Cardiovascular;  Laterality: N/A;  . ROBOTIC ASSISTED LAPAROSCOPIC LYSIS OF ADHESION  01/13/2013   Procedure: ROBOTIC ASSISTED LAPAROSCOPIC LYSIS OF ADHESION EXTENSIVE;  Surgeon: Alexis Frock, MD;  Location: [facility];  Service: Urology;;  . ROBOTIC ASSITED PARTIAL NEPHRECTOMY Right 01/13/2013   Procedure: ROBOTIC ASSITED PARTIAL NEPHRECTOMY CONVERTED TO ROBOTIC ASSISTED RIGHT RADICAL NEPHRECTOMY;  Surgeon: Alexis Frock, MD;  Location: [facility];  Service: Urology;  Laterality: Right;     . allopurinol  100 mg Oral Daily  . amiodarone  200 mg Oral Daily  . apixaban  5 mg Oral  BID  . atorvastatin  20 mg Oral q1800  . colchicine  0.6 mg Oral BID  . furosemide  40 mg Intravenous Once  . gabapentin  300 mg Oral TID  . isosorbide mononitrate  30 mg Oral Daily  . sodium chloride flush  3 mL Intravenous Q12H     Inpatient Medications:  . allopurinol  100 mg Oral Daily  . amiodarone  200 mg Oral Daily  . apixaban  5 mg Oral BID  . atorvastatin  20 mg Oral q1800  . colchicine  0.6 mg Oral BID  . furosemide  40 mg Intravenous Once  . gabapentin  300 mg Oral TID  . isosorbide mononitrate  30 mg Oral Daily  . sodium chloride flush  3 mL Intravenous Q12H    Allergies:    Allergies  Allergen Reactions  . Aspirin Shortness Of Breath, Itching and Rash     Burning sensation (Patient reports he tolerates other NSAIDS)   . Bee Venom Hives and Swelling  . Lisinopril Cough  . Tomato Rash    Social History   Socioeconomic History  . Marital status: Significant Other    Spouse name: Not on file  . Number of children: Not on file  . Years of education: Not on file  . Highest education level: Not on file  Occupational History  . Not on file  Social Needs  . Financial resource strain: Not on file  . Food insecurity:    Worry: Not on file    Inability: Not on file  . Transportation needs:    Medical: Not on file    Non-medical: Not on file  Tobacco Use  . Smoking status: Former Smoker    Packs/day: 0.25    Years: 22.00    Pack years: 5.50    Last attempt to quit: 04/16/2015    Years since quitting: 2.7  . Smokeless tobacco: Never Used  Substance and Sexual Activity  . Alcohol use: No    Comment: occ   . Drug use: Yes    Frequency: 14.0 times per week    Types: Marijuana    Comment: uses marijuana twice a day  . Sexual activity: Yes    Birth control/protection: Condom  Lifestyle  . Physical activity:    Days per week: Not on file    Minutes per session: Not on file  . Stress: Not on file  Relationships  . Social connections:    Talks on phone: Not on file    Gets together: Not on file    Attends religious service: Not on file    Active member of club or organization: Not on file    Attends meetings of clubs or organizations: Not on file    Relationship status: Not on file  . Intimate partner violence:    Fear of current or ex partner: Not on file    Emotionally abused: Not on file    Physically abused: Not on file    Forced sexual activity: Not on file  Other Topics Concern  . Not on file  Social History Narrative  . Not on file     Family History  Problem Relation Age of Onset  . Hypertension Mother   . Diabetes Maternal Aunt    . Heart attack Neg Hx   . Stroke Neg Hx      Review of Systems: All other systems reviewed and are otherwise negative except as noted above.  Physical Exam: Vitals:   12/30/17  0530 12/30/17 0600 12/30/17 0630 12/30/17 0700  BP: 114/80 117/74 104/72 103/76  Pulse: 97 92 98   Resp: (!) 34 (!) 36 (!) 43 (!) 36  Temp:      TempSrc:      SpO2: 92% 95% 99%   Weight:      Height:        GEN- The patient is obese appearing, alert and oriented x 3 today.   HEENT: normocephalic, atraumatic; sclera clear, conjunctiva pink; hearing intact; oropharynx clear; neck supple Lungs- Clear to ausculation bilaterally, normal work of breathing.  No wheezes, rales, rhonchi Heart- Regular rate and rhythm  GI- soft, non-tender, non-distended, bowel sounds present Extremities- no clubbing, cyanosis, +LE edema MS- no significant deformity or atrophy Skin- warm and dry, no rash or lesion Psych- euthymic mood, full affect Neuro- strength and sensation are intact  Labs:   Lab Results  Component Value Date   WBC 10.1 12/29/2017   HGB 12.3 (L) 12/29/2017   HCT 39.2 12/29/2017   MCV 73.7 (L) 12/29/2017   PLT 337 12/29/2017    Recent Labs  Lab 12/30/17 0432  NA 137  K 3.9  CL 107  CO2 21*  BUN 28*  CREATININE 2.02*  CALCIUM 9.1  GLUCOSE 152*      Radiology/Studies: Dg Chest Port 1 View  Result Date: 12/29/2017 CLINICAL DATA:  42 year old male with syncope. EXAM: PORTABLE CHEST 1 VIEW COMPARISON:  Chest radiograph dated 06/10/2017 FINDINGS: There is a stable cardiomegaly. There is mild vascular congestion. Bilateral hazy airspace density most consistent with alveolar edema. Pneumonia is not excluded. Clinical correlation is recommended. There is no pleural effusion or pneumothorax. A single lead AICD device. No acute osseous pathology. IMPRESSION: Cardiomegaly with vascular congestion and pulmonary edema. Pneumonia is not excluded. Clinical correlation is recommended. Electronically Signed    By: Anner Crete M.D.   On: 12/29/2017 21:35    EKG:ST, rate 109 (personally reviewed)  TELEMETRY: ST (personally reviewed)  DEVICE HISTORY: Environmental education officer implanted 2016 for primary prevention   Assessment/Plan: 1.  Ventricular tachycardia Appropriate ICD therapy for VT cycle length 27msec Increase Amiodarone to 400mg  daily for 2 weeks then resume 200mg  daily  Keep K>3.9, Mg >1.8 No driving x6 months (pt aware)  2.  Acute on chronic systolic heart failure Diuresing per AHF team  3.  OSA Compliance with CPAP encouraged   4.  Morbid obesity Body mass index is 51.69 kg/m. Weight loss encouraged   5.  Paroxysmal atrial fibrillation Continue Eliquis for CHADS2VASC of 3  Will schedule early outpatient follow up with Dr Caryl Comes.    Signed, Chanetta Marshall, NP 12/30/2017 9:09 AM  EP attending  Patient seen and examined.  Agree with the findings as noted above.  The patient is a very pleasant 42 year old man with chronic systolic heart failure status post subcutaneous ICD insertion several years ago.  He has had worsening heart failure symptoms.  He was out of his amiodarone, although he states he was only out for a day or so.  He received an appropriate ICD therapy for ventricular tachycardia.  The patient notes increasing shortness of breath.  He admits to some dietary noncompliance, although he denies medical noncompliance.  He is not been able to lose weight.  His exam is notable for pleasant morbidly obese 42 year old man who is very dyspneic.  He has 2+ peripheral edema.  His cardiovascular exam reveals a regular rate and rhythm but his heart sounds are distant.  Lungs reveal  rales approximately one third the way up.  Abdominal exam is obese.  Telemetry demonstrates sinus tachycardia.  ICD interrogation is carried out as noted above.  Our plan is to increase the patient's dose of amiodarone therapy.  We will defer the decision about heart failure management to the heart  failure team.  He will follow-up with Dr. Caryl Comes in the next few weeks.  Cristopher Peru, MD

## 2017-12-30 NOTE — Progress Notes (Signed)
  Came to check on patient this afternoon.   He remains SOB, and hypoxic requiring O2 via [place] at 2 lpm. He is not on chronic O2.    Will repeat 40 mg IV lasix this pm and keep overnight.    Pt also complaining of severe R ankle pain, worse on weight bearing since fall with syncope.  Will check 2 view x ray.    All above discussed with MD.     Beryle Beams" Lawrence, PA-C 12/30/2017 3:18 PM

## 2017-12-31 ENCOUNTER — Inpatient Hospital Stay (HOSPITAL_COMMUNITY): Payer: Medicaid Other

## 2017-12-31 DIAGNOSIS — R079 Chest pain, unspecified: Secondary | ICD-10-CM

## 2017-12-31 LAB — BASIC METABOLIC PANEL
Anion gap: 9 (ref 5–15)
BUN: 25 mg/dL — AB (ref 6–20)
CO2: 20 mmol/L — ABNORMAL LOW (ref 22–32)
CREATININE: 1.92 mg/dL — AB (ref 0.61–1.24)
Calcium: 8.7 mg/dL — ABNORMAL LOW (ref 8.9–10.3)
Chloride: 108 mmol/L (ref 101–111)
GFR, EST AFRICAN AMERICAN: 48 mL/min — AB (ref >60)
GFR, EST NON AFRICAN AMERICAN: 41 mL/min — AB (ref >60)
Glucose, Bld: 136 mg/dL — ABNORMAL HIGH (ref 65–99)
Potassium: 4.1 mmol/L (ref 3.5–5.1)
SODIUM: 137 mmol/L (ref 135–145)

## 2017-12-31 LAB — ECHOCARDIOGRAM COMPLETE
Height: 67 in
WEIGHTICAEL: 5142.89 [oz_av]

## 2017-12-31 LAB — GLUCOSE, CAPILLARY: GLUCOSE-CAPILLARY: 142 mg/dL — AB (ref 65–99)

## 2017-12-31 MED ORDER — SACUBITRIL-VALSARTAN 97-103 MG PO TABS
1.0000 | ORAL_TABLET | Freq: Two times a day (BID) | ORAL | Status: DC
  Administered 2017-12-31 – 2018-01-01 (×3): 1 via ORAL
  Filled 2017-12-31 (×4): qty 1

## 2017-12-31 MED ORDER — DOXYCYCLINE HYCLATE 100 MG PO TABS
100.0000 mg | ORAL_TABLET | Freq: Two times a day (BID) | ORAL | Status: DC
  Administered 2017-12-31 – 2018-01-01 (×3): 100 mg via ORAL
  Filled 2017-12-31 (×4): qty 1

## 2017-12-31 MED ORDER — FUROSEMIDE 10 MG/ML IJ SOLN
40.0000 mg | Freq: Once | INTRAMUSCULAR | Status: AC
  Administered 2017-12-31: 40 mg via INTRAVENOUS
  Filled 2017-12-31: qty 4

## 2017-12-31 NOTE — Progress Notes (Addendum)
Advanced Heart Failure Rounding Note  PCP-Cardiologist: Glori Bickers, MD   Subjective:    Admitted 12/29/17 with syncope. ICD showed 14 sec of VT with shock. Pt c/o 2 days of intermittent chest tightness leading up to it, but felt as if he had fluid overload.  Denies any significant, exertional CP.   Ankle Xray 12/30/17 with no acute fracture or dislocation. + moderate soft tissue swelling.   Feeling better this am. On CPAP  Creatinine up to 1.92. K 4.1.   Objective:   Weight Range: (!) 321 lb 6.9 oz (145.8 kg) Body mass index is 50.34 kg/m.   Vital Signs:   Temp:  [98.4 F (36.9 C)-99.6 F (37.6 C)] 98.8 F (37.1 C) (03/29 0755) Pulse Rate:  [92-107] 104 (03/29 0755) Resp:  [16-45] 21 (03/29 0755) BP: (89-128)/(64-98) 121/98 (03/29 0755) SpO2:  [89 %-100 %] 100 % (03/29 0755) Weight:  [321 lb 6.9 oz (145.8 kg)-323 lb 10.2 oz (146.8 kg)] 321 lb 6.9 oz (145.8 kg) (03/29 0649) Last BM Date: 12/30/17 Weight change: Filed Weights   12/29/17 1925 12/30/17 1932 12/31/17 0649  Weight: (!) 330 lb (149.7 kg) (!) 323 lb 10.2 oz (146.8 kg) (!) 321 lb 6.9 oz (145.8 kg)   Intake/Output:   Intake/Output Summary (Last 24 hours) at 12/31/2017 0837 Last data filed at 12/30/2017 2155 Gross per 24 hour  Intake 483 ml  Output 300 ml  Net 183 ml     Physical Exam    General:  Well appearing. NAD.  HEENT: Normal Neck: Supple. JVP hard to assess with size . Carotids 2+ bilat; no bruits. No lymphadenopathy or thyromegaly appreciated. Cor: PMI nondisplaced. Regular rate & rhythm. No rubs, gallops or murmurs. Lungs: Clear Abdomen: Obese, soft, nontender, nondistended. No hepatosplenomegaly. No bruits or masses. Good bowel sounds. Extremities: No cyanosis, clubbing, or rash. Trace ankle edema.  Neuro: Alert & orientedx3, cranial nerves grossly intact. moves all 4 extremities w/o difficulty. Affect pleasant  Telemetry   NSR/ST 90-100s, personally reviewed.   EKG    No new  tracings.    Labs    CBC Recent Labs    12/29/17 1931  WBC 10.1  HGB 12.3*  HCT 39.2  MCV 73.7*  PLT 425   Basic Metabolic Panel Recent Labs    12/29/17 1931 12/30/17 0432 12/30/17 1407 12/31/17 0300  NA 136 137 138 137  K 3.9 3.9 4.3 4.1  CL 107 107 106 108  CO2 19* 21* 25 20*  GLUCOSE 202* 152* 177* 136*  BUN 28* 28* 26* 25*  CREATININE 1.90* 2.02* 2.02* 1.92*  CALCIUM 9.2 9.1 9.1 8.7*  MG 1.8 2.2  --   --    Liver Function Tests No results for input(s): AST, ALT, ALKPHOS, BILITOT, PROT, ALBUMIN in the last 72 hours. No results for input(s): LIPASE, AMYLASE in the last 72 hours. Cardiac Enzymes No results for input(s): CKTOTAL, CKMB, CKMBINDEX, TROPONINI in the last 72 hours.  BNP: BNP (last 3 results) Recent Labs    06/09/17 1538 12/29/17 1931 12/30/17 0432  BNP 1,110.6* 973.4* 1,177.3*    ProBNP (last 3 results) No results for input(s): PROBNP in the last 8760 hours.   D-Dimer No results for input(s): DDIMER in the last 72 hours. Hemoglobin A1C No results for input(s): HGBA1C in the last 72 hours. Fasting Lipid Panel No results for input(s): CHOL, HDL, LDLCALC, TRIG, CHOLHDL, LDLDIRECT in the last 72 hours. Thyroid Function Tests No results for input(s): TSH, T4TOTAL, T3FREE,  THYROIDAB in the last 72 hours.  Invalid input(s): FREET3  Other results:   Imaging    Dg Ankle 2 Views Right  Result Date: 12/30/2017 CLINICAL DATA:  Status post fall yesterday. The patient is complaining of generalized pain especially over the lateral malleolus. Symptoms are worse today than yesterday. EXAM: RIGHT ANKLE - 2 VIEW COMPARISON:  Right ankle series of April 03, 2012 FINDINGS: The bones are subjectively adequately mineralized. There is mild irregularity of the tip of the lateral malleolus and of the adjacent the talus compatible with degenerative change. No discrete fracture is observed. The medial and posterior malleoli are unremarkable. A small spur arises  from the anterior inferior articular margin of the tibia. The talus is intact as is the calcaneus. There is a plantar calcaneal spur. There are degenerative changes noted along the dorsum of the tarsal navicular and cuboid. IMPRESSION: No definite acute fracture or dislocation of the right ankle. Mild degenerative changes. Moderate soft tissue swelling especially over the lateral malleolus and lateral aspect of the talus. If further imaging is felt indicated clinically, MRI would likely be the most useful modality. Electronically Signed   By: David  Martinique M.D.   On: 12/30/2017 16:07     Medications:     Scheduled Medications: . allopurinol  100 mg Oral Daily  . amiodarone  400 mg Oral Daily  . apixaban  5 mg Oral BID  . atorvastatin  20 mg Oral q1800  . colchicine  0.6 mg Oral BID  . gabapentin  300 mg Oral TID  . isosorbide mononitrate  30 mg Oral Daily  . sacubitril-valsartan  1 tablet Oral BID  . sodium chloride flush  3 mL Intravenous Q12H    Infusions: . sodium chloride      PRN Medications: sodium chloride, acetaminophen, acetaminophen-codeine, cyclobenzaprine, levalbuterol, ondansetron (ZOFRAN) IV, sodium chloride flush  Patient Profile   Jeffrey [name] is a 42 y.o. male with a history of  NICM , admitted  With  CHF exacerbation. VT with ICD shock noted on ICD interrogation.   Assessment/Plan   1. VT with ICD shock - [employer] ICD interrogation in chart - Pt had 14 second episode of VT with shock ~1841. - K 3.9 and Mg 2.2 on admit - Initial troponin 0.04. - No driving until September 28th, 2019. - EP has seen. Recommended amiodarone to 400 mg daily.   2. Acute on chronic systolic CHF - Echo 5/95/63 LVEF 20-25%. Repeat pending. - Volume status somewhat improved - Give one more dose of 40 mg IV lasix this am with great response.  - Continue imdur 30 mg daily - Hydralazine on hold with AKI and soft pressures. - Increase Entresto back to 97/103 mg BID.  - Hold  spiro for now. Will need to decide on discharge if we will continue.  - Last cath 05/2015 with normal coronaries  3. PAF - EKG shows Sinus tach.  - Continue Eliquis 5 mg BID. No bleeding.   4. AKI on CKD III - UA with > 300 protein - Creatinine slightly improved today.   5. OSA - Machine broken. Have asked patient to follow up with [facility] regarding repair. No change.   6. DM2 - SSI in house.   7. RCC s/p nephrectomy 2014 - Follow creatinine.   8. Morbid Obesity - Body mass index is 50.34 kg/m.  - Have stressed importance of weight loss.   Suspect home today with close follow up.   Medication concerns reviewed  with patient and pharmacy team. Barriers identified: None at this time.   Length of Stay: 1  Annamaria Helling  12/31/2017, 8:37 AM  Advanced Heart Failure Team Pager [phone] (M-F; 7a - 4p)  Please contact [facility] for night-coverage after hours (4p -7a ) and weekends on [email or web address]   Patient seen and examined with the above-signed Advanced Practice Provider and/or Housestaff. I personally reviewed laboratory data, imaging studies and relevant notes. I independently examined the patient and formulated the important aspects of the plan. I have edited the note to reflect any of my changes or salient points. I have personally discussed the plan with the patient and/or family.  Volume status much improved. Feeling better. O2 sats stable. No PND. Rhythm stable. No further VT. BP remains soft.   [place] for d/c today with med adjustments as above,   Glori Bickers, MD  12:28 PM

## 2017-12-31 NOTE — Progress Notes (Signed)
  Prior to discharge checked on pt response to IV lasix.  He remained SOB and c/o slight hemoptysis. Low grade fever of 99.9 noted.  Discussed with Dr. [name]. Will cover for bronchitis with Doxycycline, and continue to watch at least until tomorrow.  Will repeat 40 mg IV lasix this evening.   Legrand Como [address] [place], [place]

## 2017-12-31 NOTE — Progress Notes (Signed)
Pt walked unit with NT. SpO2 while ambulating on room air remained above 90%. Pt now resting at 93% on room air. RN will continue to monitor.

## 2017-12-31 NOTE — Progress Notes (Signed)
  Echocardiogram 2D Echocardiogram has been performed.  Jawann Urbani T Donivan Thammavong 12/31/2017, 11:43 AM

## 2018-01-01 LAB — BASIC METABOLIC PANEL
Anion gap: 9 (ref 5–15)
BUN: 22 mg/dL — AB (ref 6–20)
CO2: 23 mmol/L (ref 22–32)
Calcium: 8.9 mg/dL (ref 8.9–10.3)
Chloride: 106 mmol/L (ref 101–111)
Creatinine, Ser: 1.9 mg/dL — ABNORMAL HIGH (ref 0.61–1.24)
GFR calc Af Amer: 48 mL/min — ABNORMAL LOW (ref >60)
GFR, EST NON AFRICAN AMERICAN: 42 mL/min — AB (ref >60)
GLUCOSE: 123 mg/dL — AB (ref 65–99)
POTASSIUM: 3.8 mmol/L (ref 3.5–5.1)
Sodium: 138 mmol/L (ref 135–145)

## 2018-01-01 LAB — GLUCOSE, CAPILLARY
GLUCOSE-CAPILLARY: 159 mg/dL — AB (ref 65–99)
Glucose-Capillary: 127 mg/dL — ABNORMAL HIGH (ref 65–99)

## 2018-01-01 MED ORDER — POTASSIUM CHLORIDE CRYS ER 10 MEQ PO TBCR
EXTENDED_RELEASE_TABLET | ORAL | Status: AC
  Filled 2018-01-01: qty 2

## 2018-01-01 MED ORDER — AMIODARONE HCL 200 MG PO TABS: ORAL_TABLET | ORAL | 3 refills | Status: DC

## 2018-01-01 MED ORDER — POTASSIUM CHLORIDE ER 10 MEQ PO TBCR: 20.0000 meq | EXTENDED_RELEASE_TABLET | Freq: Every day | ORAL | 5 refills | Status: DC

## 2018-01-01 MED ORDER — DOXYCYCLINE HYCLATE 100 MG PO TABS: 100.0000 mg | ORAL_TABLET | Freq: Two times a day (BID) | ORAL | 0 refills | Status: AC

## 2018-01-01 MED ORDER — POTASSIUM CHLORIDE ER 10 MEQ PO TBCR: 10.0000 meq | EXTENDED_RELEASE_TABLET | Freq: Every day | ORAL | 5 refills | Status: DC

## 2018-01-01 MED ORDER — TORSEMIDE 20 MG PO TABS: 40.0000 mg | ORAL_TABLET | Freq: Every day | ORAL | 3 refills | Status: DC

## 2018-01-01 NOTE — Discharge Summary (Addendum)
Discharge Summary    Patient ID: Jeffrey [name],  MRN: [id number], DOB/AGE: 1976/03/21 42 y.o.  Admit date: 12/29/2017 Discharge date: 01/01/2018  Primary Care Provider: Scot Jun Primary Cardiologist: Glori Bickers, MD  Discharge Diagnoses    Principal Problem:   Acute on chronic systolic (congestive) heart failure (HCC) Active Problems:   HYPERTENSION, BENIGN ESSENTIAL   Type 2 diabetes mellitus with stage 3 chronic kidney disease, with long-term current use of insulin (HCC)   OSA (obstructive sleep apnea)   CKD (chronic kidney disease), stage III (HCC)   PAF (paroxysmal atrial fibrillation) (HCC)   Ventricular tachycardia (HCC)   Allergies Allergies  Allergen Reactions  . Aspirin Shortness Of Breath, Itching and Rash     Burning sensation (Patient reports he tolerates other NSAIDS)   . Bee Venom Hives and Swelling  . Lisinopril Cough  . Tomato Rash    Diagnostic Studies/Procedures    Echo 12/31/17: Study Conclusions - Left ventricle: The cavity size was severely dilated. Systolic   function was severely reduced. The estimated ejection fraction   was in the range of 25% to 30%. Diffuse hypokinesis. The study is   not technically sufficient to allow evaluation of LV diastolic function. - Mitral valve: Mildly thickened leaflets . There was moderate regurgitation. Valve area by pressure half-time: 1.71 cm^2. - Left atrium: Severely dilated. - Right ventricle: The cavity size was mildly dilated. - Right atrium: Severely dilated. - Tricuspid valve: There was moderate regurgitation. - Pulmonary arteries: PA peak pressure: 67 mm Hg (S). - Inferior vena cava: The vessel was dilated. The respirophasic   diameter changes were blunted (< 50%), consistent with elevated   central venous pressure.  Impressions: - Compared to a prior study in 2018, the LVEF may be slightly higher at 25-30%.   History of Present Illness     Jeffrey [name] has NICM seen in [facility]. with a history of  NICM , admitted  With  CHF  Exacerbation, ICD  Shock  And  VT on ICD  Interrogation.    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   Admitted to [facility] in 3/16 secondary to atypical chest pain, dyspnea, and mild troponin elevation. Echo during hospitalization shows "severely reduced LV function" - EF not quantified due to poor windows. He was diuresed and was down about 7 pounds at discharge, at 289 on the hospital scale.   Admitted 05/14/15 with CP at rest. CXR looked improved from previous. CP resolved spontaneously after hospitalization and troponin was flat. R/LHC showed normal coronaries and moderately elevated filling pressures with moderately depressed cardiac output. He had been previously seen by EP and approved for ICD with chronic low EF. [place] subcutaneous ICD was implanted 05/17/15. He was diuresed with IV lasix 40 mg. Discharge weight 290 lb.  Admitted 4/11 through 01/16/16 with volume overload in the setting of missed medications. Diuresed with IV lasix and transitioned to torsemide 20 mg daily. Discharge weight was 293 pounds.   Admitted 9/4 - 0/0/93 with A/C systolic CHF in the setting of new afib. Started on IV lasix and amiodarone. Pt diuresed and converted to NSR without requiring DCCV. Started on Eliquis for [facility]. Continued on po amio on discharge with wean over several weeks.  ECHO 01/16/2016 EF 20-25% Grade III DD Echo 05/07/15. EF 20-25%, grade II diastolic  dysfunction, diffuse hypokinesis, moderate LV dilation, mild LVH, normal RV size with mildly decreased systolic function.  Echo in 2012 with EF 20-25%. In 1/13 had nuclear study EF 45-50% suggestive of NICM.   [facility] 05/17/15 RA = 7 RV = 42/3/7 PA = 43/30 (39) PCW = 26 Fick cardiac output/index = 4.9/2.1 PVR = 2.7 WU SVR  = 1651 FA sat = 95% PA sat = 65%, 65% Ao = 122/97 (109) LV = 110/31/33 1) Normal coronaries 2) Moderately elevated filling pressures with moderately depressed cardiac output  Exam     General:  Obese male sitting up in chair Well appearing. No resp difficulty HEENT: normal Neck: supple. no JVD. Carotids 2+ bilat; no bruits. No lymphadenopathy or thryomegaly appreciated. Cor: PMI nondisplaced. Regular rate & rhythm. No rubs, gallops or murmurs. Lungs: clear Abdomen: obese soft, nontender, nondistended. No hepatosplenomegaly. No bruits or masses. Good bowel sounds. Extremities: no cyanosis, clubbing, rash, edema Neuro: alert & orientedx3, cranial nerves grossly intact. moves all 4 extremities w/o difficulty. Affect pleasant   Hospital Course     Consultants: EP  He was admitted to cardiology for evaluation of ICD shock and acute on chronic systolic heart failure.   VT with ICD shock ICD interrogated. Pt had a 14 second episode of VT with shock at approximately [place]. EP consulted. Increased amiodarone to 400 mg daily for 2 weeks, then resume 200 mg daily. Keep Mg > 1.8 and K > 3.9.  In hospital K+ 3.8-4.3. Will recheck at follow up.    Acute on chronic systolic heart failure Repeat echocardiogram with LVEF 25-30%, which is a slight improvement compared to prior. He was diuresed with IV lasix. Today, he is overall net negative 4L with 2.8 L urine output yesterday. His weight is 320 lbs, down from 330 lbs from admission. Will send home on Torsemide 40 mg (up from previous 20 mg) Continue entresto and imdur. Hydaralzine and spiro were on hold for marginal pressures and AKI.  No beta blocker for marginal CI in past. May consider as an outpatient.   Paroxysmal atrial fibrillation Continue eliquis 5 mg BID.  This patients CHA2DS2-VASc Score and unadjusted Ischemic Stroke Rate (% per year) is equal to 3.2 % stroke rate/year from a score of 3 (CHF, DM, HTN).    Acute on chronic kidney  disease sCr at discharge is 1.90 (1.92). Baseline appears to be 1.6-1.9. sCr peaked at 2.02 this admission. Repeat BMP at OP follow up.    OSA Pt states his machine is broken and he will work to get a replacement.    Hospital course additionally complicated by hypoxia, low grade fever, and scant hemoptysis. Started on doxycyline 100 mg BID for 5 day course for bronchitis. Will re-test for O2 requirement after this has resolved as outpatient.  _____________  Discharge Vitals Blood pressure 113/78, pulse 90, temperature 98.1 F (36.7 C), temperature source Oral, resp. rate (!) 26, height 5\' 7"  (1.702 m), weight (!) 320 lb 5.3 oz (145.3 kg), SpO2 100 %.  Filed Weights   12/30/17 1932 12/31/17 0649 01/01/18 0643  Weight: (!) 323 lb 10.2 oz (146.8 kg) (!) 321 lb 6.9 oz (145.8 kg) (!) 320 lb 5.3 oz (145.3 kg)    Labs & Radiologic Studies    CBC Recent Labs    12/29/17 1931  WBC 10.1  HGB 12.3*  HCT 39.2  MCV 73.7*  PLT 301   Basic Metabolic Panel Recent Labs    12/29/17 1931 12/30/17 0432  12/31/17  0300 01/01/18 0352  NA 136 137   < > 137 138  K 3.9 3.9   < > 4.1 3.8  CL 107 107   < > 108 106  CO2 19* 21*   < > 20* 23  GLUCOSE 202* 152*   < > 136* 123*  BUN 28* 28*   < > 25* 22*  CREATININE 1.90* 2.02*   < > 1.92* 1.90*  CALCIUM 9.2 9.1   < > 8.7* 8.9  MG 1.8 2.2  --   --   --    < > = values in this interval not displayed.   Liver Function Tests No results for input(s): AST, ALT, ALKPHOS, BILITOT, PROT, ALBUMIN in the last 72 hours. No results for input(s): LIPASE, AMYLASE in the last 72 hours. Cardiac Enzymes No results for input(s): CKTOTAL, CKMB, CKMBINDEX, TROPONINI in the last 72 hours. BNP Invalid input(s): POCBNP D-Dimer No results for input(s): DDIMER in the last 72 hours. Hemoglobin A1C No results for input(s): HGBA1C in the last 72 hours. Fasting Lipid Panel No results for input(s): CHOL, HDL, LDLCALC, TRIG, CHOLHDL, LDLDIRECT in the last 72  hours. Thyroid Function Tests No results for input(s): TSH, T4TOTAL, T3FREE, THYROIDAB in the last 72 hours.  Invalid input(s): FREET3 _____________  Dg Ankle 2 Views Right  Result Date: 12/30/2017 CLINICAL DATA:  Status post fall yesterday. The patient is complaining of generalized pain especially over the lateral malleolus. Symptoms are worse today than yesterday. EXAM: RIGHT ANKLE - 2 VIEW COMPARISON:  Right ankle series of April 03, 2012 FINDINGS: The bones are subjectively adequately mineralized. There is mild irregularity of the tip of the lateral malleolus and of the adjacent the talus compatible with degenerative change. No discrete fracture is observed. The medial and posterior malleoli are unremarkable. A small spur arises from the anterior inferior articular margin of the tibia. The talus is intact as is the calcaneus. There is a plantar calcaneal spur. There are degenerative changes noted along the dorsum of the tarsal navicular and cuboid. IMPRESSION: No definite acute fracture or dislocation of the right ankle. Mild degenerative changes. Moderate soft tissue swelling especially over the lateral malleolus and lateral aspect of the talus. If further imaging is felt indicated clinically, MRI would likely be the most useful modality. Electronically Signed   By: David  Martinique M.D.   On: 12/30/2017 16:07   Dg Chest Port 1 View  Result Date: 12/29/2017 CLINICAL DATA:  42 year old male with syncope. EXAM: PORTABLE CHEST 1 VIEW COMPARISON:  Chest radiograph dated 06/10/2017 FINDINGS: There is a stable cardiomegaly. There is mild vascular congestion. Bilateral hazy airspace density most consistent with alveolar edema. Pneumonia is not excluded. Clinical correlation is recommended. There is no pleural effusion or pneumothorax. A single lead AICD device. No acute osseous pathology. IMPRESSION: Cardiomegaly with vascular congestion and pulmonary edema. Pneumonia is not excluded. Clinical correlation is  recommended. Electronically Signed   By: Anner Crete M.D.   On: 12/29/2017 21:35   Disposition   Pt is being discharged home today in good condition.  Follow-up Plans & Appointments    Follow-up Information    [name], MD Follow up on 01/18/2018.   Specialty:  Cardiology Why:  at 2:30PM  Contact information: [address] [place] [address] [phone]        [facility] Follow up.   Specialty:  Cardiology Why:  Heart Failure clinic appointment on April  3rd at 3:30. Contact information: [address] [id number] [place] [phone]         Discharge Instructions    Diet - low sodium heart healthy   Complete by:  As directed    Increase activity slowly   Complete by:  As directed       Discharge Medications   Allergies as of 01/01/2018      Reactions   Aspirin Shortness Of Breath, Itching, Rash    Burning sensation (Patient reports he tolerates other NSAIDS)    Bee Venom Hives, Swelling   Lisinopril Cough   Tomato Rash      Medication List    STOP taking these medications   hydrALAZINE 100 MG tablet Commonly known as:  APRESOLINE   spironolactone 25 MG tablet Commonly known as:  ALDACTONE     TAKE these medications   acetaminophen-codeine 300-30 MG tablet Commonly known as:  TYLENOL #3 Take 1 tablet by mouth every 6 (six) hours as needed for moderate pain.   allopurinol 100 MG tablet Commonly known as:  ZYLOPRIM Take 1 tablet (100 mg total) by mouth daily.   amiodarone 200 MG tablet Commonly known as:  PACERONE Take 2 tablets (400 mg) daily for 14 days, then decrease to 200 mg daily thereafter. What changed:    how much to take  how to take this  when to take this  additional instructions   apixaban 5 MG Tabs tablet Commonly known as:  ELIQUIS Take 1 tablet (5 mg total) by mouth 2 (two) times daily.   atorvastatin 80 MG  tablet Commonly known as:  LIPITOR TAKE 1 TABLET (80 MG TOTAL) BY MOUTH EVERY EVENING.   colchicine 0.6 MG tablet Take 1 tablet (0.6 mg total) by mouth 2 (two) times daily. What changed:    when to take this  reasons to take this   cyclobenzaprine 5 MG tablet Commonly known as:  FLEXERIL Take 1 tablet (5 mg total) 3 (three) times daily as needed by mouth for muscle spasms.   doxycycline 100 MG tablet Commonly known as:  VIBRA-TABS Take 1 tablet (100 mg total) by mouth every 12 (twelve) hours for 4 days.   gabapentin 300 MG capsule Commonly known as:  NEURONTIN Take 1 capsule (300 mg total) by mouth 3 (three) times daily.   insulin NPH-regular Human (70-30) 100 UNIT/ML injection Commonly known as:  NOVOLIN 70/30 Inject 34 Units into the skin 2 (two) times daily with a meal.   isosorbide mononitrate 30 MG 24 hr tablet Commonly known as:  IMDUR Take 1 tablet (30 mg total) by mouth daily.   Magnesium Oxide 200 MG Tabs Take 1 tablet (200 mg total) by mouth daily. What changed:  how much to take   meclizine 25 MG tablet Commonly known as:  ANTIVERT Take 1 tablet (25 mg total) by mouth 3 (three) times daily as needed for dizziness (Take meclizine for spinning dizziness. You may also take Valium if your symptoms are not relieved by the meclizine alone.).   potassium chloride 10 MEQ tablet Commonly known as:  K-DUR Take 2 tablets (20 mEq total) by mouth daily.   sacubitril-valsartan 97-103 MG Commonly known as:  ENTRESTO Take 1 tablet by mouth 2 (two) times daily.   torsemide 20 MG tablet Commonly known as:  DEMADEX Take 2 tablets (40 mg total) by mouth daily. What changed:  how much to take   traMADol 50 MG tablet Commonly known as:  Veatrice Bourbon  Take 1 tablet (50 mg total) by mouth every 6 (six) hours as needed. What changed:  reasons to take this         Outstanding Labs/Studies   Follow up in EP and CHF clinics  Duration of Discharge Encounter   Greater than 30  minutes including physician time.  Signed, Daune Perch NP 01/01/2018, 2:37 PM   Patient seen and examined with the above-signed Advanced Practice Provider and/or Housestaff. I personally reviewed laboratory data, imaging studies and relevant notes. I independently examined the patient and formulated the important aspects of the plan. I have edited the note to reflect any of my changes or salient points. I have personally discussed the plan with the patient and/or family.  Much improved. No further VT. Volume status ok. Breathing improved.  [place] for d/c today. Will f/u in HF Clinic.   Glori Bickers, MD  9:35 PM

## 2018-01-01 NOTE — Progress Notes (Addendum)
2345 Bedside shift report. Pt sleeping with Cpap on, easy to arouse. NAD, no complaints. Assessed, see flow sheet. WCTM.   0215 Pt sleeping comfortably, NAD.  0430 Pt sleeping, easy to arouse, NAD, no complaints.   0545 Pt sleeping, easy to arouse, NAD, no complaints.   0645 Pt sleeping with Cpap mask on. Easy to arouse. RN assisted pt up to get am weight. Pt up ordering breakfast tray. NAD, no complaints, awaiting day shift RN for report.

## 2018-01-03 ENCOUNTER — Other Ambulatory Visit (HOSPITAL_COMMUNITY): Payer: Self-pay | Admitting: *Deleted

## 2018-01-03 DIAGNOSIS — I5022 Chronic systolic (congestive) heart failure: Secondary | ICD-10-CM

## 2018-01-03 MED ORDER — TORSEMIDE 20 MG PO TABS: 40.0000 mg | ORAL_TABLET | Freq: Every day | ORAL | 3 refills | Status: DC

## 2018-01-03 MED FILL — AMIODARONE HCL 200 MG TAB: 200 | 28 days supply | Qty: 42 | Fill #0

## 2018-01-03 MED FILL — POTASSIUM CL 10 MEQ TAB SA: 10 | 15 days supply | Qty: 30 | Fill #0

## 2018-01-03 MED FILL — ?DOXYCYCLINE 100MG TABLET: 100 | 4 days supply | Qty: 8 | Fill #0

## 2018-01-03 MED FILL — TORSEMIDE 20 MG TABLET: 20 | 30 days supply | Qty: 60 | Fill #0

## 2018-01-05 ENCOUNTER — Ambulatory Visit (HOSPITAL_COMMUNITY)
Admit: 2018-01-05 | Discharge: 2018-01-05 | Disposition: A | Discharge status: Home / Self Care | Payer: Medicaid Other | Source: Ambulatory Visit | Attending: Internal Medicine | Admitting: Internal Medicine

## 2018-01-05 VITALS — BP 110/90 | HR 87 | Wt 319.6 lb

## 2018-01-05 DIAGNOSIS — E7800 Pure hypercholesterolemia, unspecified: Secondary | ICD-10-CM | POA: Insufficient documentation

## 2018-01-05 DIAGNOSIS — Z905 Acquired absence of kidney: Secondary | ICD-10-CM | POA: Insufficient documentation

## 2018-01-05 DIAGNOSIS — I5022 Chronic systolic (congestive) heart failure: Secondary | ICD-10-CM | POA: Insufficient documentation

## 2018-01-05 DIAGNOSIS — Z886 Allergy status to analgesic agent status: Secondary | ICD-10-CM | POA: Diagnosis not present

## 2018-01-05 DIAGNOSIS — Z79899 Other long term (current) drug therapy: Secondary | ICD-10-CM | POA: Diagnosis not present

## 2018-01-05 DIAGNOSIS — Z85528 Personal history of other malignant neoplasm of kidney: Secondary | ICD-10-CM | POA: Diagnosis not present

## 2018-01-05 DIAGNOSIS — G4733 Obstructive sleep apnea (adult) (pediatric): Secondary | ICD-10-CM | POA: Insufficient documentation

## 2018-01-05 DIAGNOSIS — Z888 Allergy status to other drugs, medicaments and biological substances status: Secondary | ICD-10-CM | POA: Diagnosis not present

## 2018-01-05 DIAGNOSIS — Z794 Long term (current) use of insulin: Secondary | ICD-10-CM | POA: Insufficient documentation

## 2018-01-05 DIAGNOSIS — Z87891 Personal history of nicotine dependence: Secondary | ICD-10-CM | POA: Diagnosis not present

## 2018-01-05 DIAGNOSIS — Z91018 Allergy to other foods: Secondary | ICD-10-CM | POA: Insufficient documentation

## 2018-01-05 DIAGNOSIS — I428 Other cardiomyopathies: Secondary | ICD-10-CM | POA: Diagnosis not present

## 2018-01-05 DIAGNOSIS — Z7901 Long term (current) use of anticoagulants: Secondary | ICD-10-CM | POA: Insufficient documentation

## 2018-01-05 DIAGNOSIS — Z95810 Presence of automatic (implantable) cardiac defibrillator: Secondary | ICD-10-CM | POA: Diagnosis not present

## 2018-01-05 DIAGNOSIS — N183 Chronic kidney disease, stage 3 unspecified: Secondary | ICD-10-CM

## 2018-01-05 DIAGNOSIS — Z91030 Bee allergy status: Secondary | ICD-10-CM | POA: Diagnosis not present

## 2018-01-05 DIAGNOSIS — I480 Paroxysmal atrial fibrillation: Secondary | ICD-10-CM | POA: Diagnosis not present

## 2018-01-05 DIAGNOSIS — I472 Ventricular tachycardia, unspecified: Secondary | ICD-10-CM

## 2018-01-05 DIAGNOSIS — I5023 Acute on chronic systolic (congestive) heart failure: Secondary | ICD-10-CM

## 2018-01-05 DIAGNOSIS — I130 Hypertensive heart and chronic kidney disease with heart failure and stage 1 through stage 4 chronic kidney disease, or unspecified chronic kidney disease: Secondary | ICD-10-CM | POA: Insufficient documentation

## 2018-01-05 DIAGNOSIS — Z6841 Body Mass Index (BMI) 40.0 and over, adult: Secondary | ICD-10-CM | POA: Diagnosis not present

## 2018-01-05 DIAGNOSIS — E1122 Type 2 diabetes mellitus with diabetic chronic kidney disease: Secondary | ICD-10-CM | POA: Diagnosis not present

## 2018-01-05 DIAGNOSIS — M109 Gout, unspecified: Secondary | ICD-10-CM | POA: Diagnosis not present

## 2018-01-05 DIAGNOSIS — I10 Essential (primary) hypertension: Secondary | ICD-10-CM

## 2018-01-05 LAB — BASIC METABOLIC PANEL
ANION GAP: 8 (ref 5–15)
BUN: 32 mg/dL — AB (ref 6–20)
CO2: 24 mmol/L (ref 22–32)
Calcium: 9.6 mg/dL (ref 8.9–10.3)
Chloride: 104 mmol/L (ref 101–111)
Creatinine, Ser: 2.02 mg/dL — ABNORMAL HIGH (ref 0.61–1.24)
GFR calc Af Amer: 45 mL/min — ABNORMAL LOW (ref >60)
GFR, EST NON AFRICAN AMERICAN: 39 mL/min — AB (ref >60)
GLUCOSE: 112 mg/dL — AB (ref 65–99)
Potassium: 4.1 mmol/L (ref 3.5–5.1)
Sodium: 136 mmol/L (ref 135–145)

## 2018-01-05 NOTE — Patient Instructions (Signed)
Labs today (will call for abnormal results, otherwise no news is good news)  Sleep Study has been ordered for you, we will contact you to schedule appointment.  Follow up with Barrington Ellison, PA in 1 month.  Follow up with Dr. [name] in 3 months, we will schedule this at your next follow up.

## 2018-01-05 NOTE — Progress Notes (Signed)
Patient ID: Jeffrey [name], male   DOB: 23-Apr-1976, 42 y.o.   MRN: [id number]      Advanced Heart Failure Clinic Note   Patient Name: Jeffrey [name] Date of Encounter: 01/05/2018  Primary Care Provider:  Scot Jun, FNP Primary Cardiologist:  Lizbeth Bark, MD  Primary HF Cardiologist: Bensimhon  History:  Jeffrey [name] is a 42 y.o. 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    Admitted to [facility] in 3/16 secondary to atypical chest pain, dyspnea, and mild troponin elevation. Echo during hospitalization shows "severely reduced LV function" - EF not quantified due to poor windows. He was diuresed and was down about 7 pounds at discharge, at 289 on the hospital scale.   Admitted 05/14/15 with CP at rest. CXR looked improved from previous. CP resolved spontaneously after hospitalization and troponin was flat. R/LHC showed normal coronaries and moderately elevated filling pressures with moderately depressed cardiac output. He had been previously seen by EP and approved for ICD with chronic low EF. [employer] subcutaneous ICD was implanted 05/17/15.  He was diuresed with IV lasix 40 mg. Discharge weight 290 lb.  Admitted 4/11 through 01/16/16 with volume overload in the setting of missed medications. Diuresed with IV lasix and transitioned to torsemide 20 mg daily. Discharge weight was 293 pounds.   Admitted 9/4 - 04/09/18 with A/C systolic CHF in the setting of new afib. Started on IV lasix and amiodarone. Pt diuresed and converted to NSR without requiring DCCV. Started on Eliquis for [facility]. Continued on po amio on discharge with wean over several weeks.  Admitted 3/27 - 01/01/18 with ICD shock. Electrolytes stable. Low grade fever and volume overload noted. Diuresed 7 lbs and treated with course of ABX. Echo 12/31/17 with LVEF 25-30%.   He presents today for post  hospital follow up. Feeling much better. Weight down 1 lb since discharge. BP stable. He denies lightheadedness or dizziness. No further ICD shocks. No CP, or SOB. His productive cough has cleared up and he denies fevers. He is taking all medications as directed. Feels like torsemide 40 mg works better than 20. Denies DOE or orthopnea.    Echo 12/31/17 LVEF 25-30%, Moderate MR, mild RV dilation, Severe RAE, Mod TR, Pa peak pressure 67 mm Hg.   ECHO 01/16/2016 EF 20-25%  Grade III DD Echo 05/07/15.  EF 20-25%, grade II diastolic dysfunction, diffuse hypokinesis, moderate LV dilation, mild LVH, normal RV size with mildly decreased systolic function.   Echo in 2012 with EF 20-25%.  In 1/13 had nuclear study EF 45-50% suggestive of NICM.   [name] 05/17/15 RA = 7 RV = 42/3/7 PA = 43/30 (39) PCW = 26 Fick cardiac output/index = 4.9/2.1 PVR = 2.7 WU SVR = 1651 FA sat = 95% PA sat = 65%, 65% Ao = 122/97 (109) LV = 110/31/33 1) Normal coronaries 2) Moderately elevated filling pressures with moderately depressed cardiac output  Review of systems complete and found to be negative unless listed in HPI.     Past Medical History:  Diagnosis Date  . Anxiety   . Aspirin allergy   . Childhood asthma   . Chronic systolic CHF (congestive heart failure) ([place])    a. EF 20-25% in 2012. b. EF 45-50% in 10/2011 with nonischemic nuc - presumed NICM. c. 12/2014 Echo: Sev depressed LV fxn, sev dil LV,  mild LVH, mild MR, sev dil LA, mildly reduced RV fxn.  . CKD (chronic kidney disease) stage 2, GFR 60-89 ml/min   . High cholesterol   . Hypertension   . Morbid obesity ([place])   . Nephrolithiasis   . OSA on CPAP   . Presumed NICM    a. 04/2014 Myoview: EF 26%, glob HK, sev glob HK, ? prior infarct;  b. Never cathed 2/2 CKD.  [name] Renal cell carcinoma ([place])    a. s/p Rt robotic assisted partial converted to radical nephrectomy on 01/2013.  . Troponin level elevated    a. 04/2014, 12/2014: felt due to CHF.  . Type II  diabetes mellitus ([place])   . Ventricular tachycardia ([place])    a. appropriate ICD therapy 12/2017   Past Surgical History:  Procedure Laterality Date  . APPENDECTOMY  07/2004  . CARDIAC CATHETERIZATION N/A 05/17/2015   Procedure: Right/Left Heart Cath and Coronary Angiography;  Surgeon: Jolaine Artist, MD;  Location: [place] [facility];  Service: Cardiovascular;  Laterality: N/A;  . EP IMPLANTABLE DEVICE N/A 05/17/2015   Procedure: SubQ ICD Implant;  Surgeon: Will Meredith Leeds, MD;  Location: [place] [facility];  Service: Cardiovascular;  Laterality: N/A;  . ROBOTIC ASSISTED LAPAROSCOPIC LYSIS OF ADHESION  01/13/2013   Procedure: ROBOTIC ASSISTED LAPAROSCOPIC LYSIS OF ADHESION EXTENSIVE;  Surgeon: Alexis Frock, MD;  Location: [facility];  Service: Urology;;  . ROBOTIC ASSITED PARTIAL NEPHRECTOMY Right 01/13/2013   Procedure: ROBOTIC ASSITED PARTIAL NEPHRECTOMY CONVERTED TO ROBOTIC ASSISTED RIGHT RADICAL NEPHRECTOMY;  Surgeon: Alexis Frock, MD;  Location: [facility];  Service: Urology;  Laterality: Right;    Allergies  Allergies  Allergen Reactions  . Aspirin Shortness Of Breath, Itching and Rash     Burning sensation (Patient reports he tolerates other NSAIDS)   . Bee Venom Hives and Swelling  . Lisinopril Cough  . Tomato Rash     Home Medications Current Outpatient Medications  Medication Sig Dispense Refill  . acetaminophen-codeine (TYLENOL #3) 300-30 MG tablet Take 1 tablet by mouth every 6 (six) hours as needed for moderate pain. 60 tablet 0  . allopurinol (ZYLOPRIM) 100 MG tablet Take 1 tablet (100 mg total) by mouth daily. 90 tablet 3  . amiodarone (PACERONE) 200 MG tablet Take 2 tablets (400 mg) daily for 14 days, then decrease to 200 mg daily thereafter. 90 tablet 3  . apixaban (ELIQUIS) 5 MG TABS tablet Take 1 tablet (5 mg total) by mouth 2 (two) times daily. 60 tablet 10  . atorvastatin (LIPITOR) 80 MG tablet TAKE 1 TABLET (80 MG TOTAL) BY MOUTH EVERY EVENING. 90 tablet 3  .  colchicine 0.6 MG tablet Take 1 tablet (0.6 mg total) by mouth 2 (two) times daily. (Patient taking differently: Take 0.6 mg by mouth 2 (two) times daily as needed (gout). ) 180 tablet 3  . cyclobenzaprine (FLEXERIL) 5 MG tablet Take 1 tablet (5 mg total) 3 (three) times daily as needed by mouth for muscle spasms. 15 tablet 0  . doxycycline (VIBRA-TABS) 100 MG tablet Take 1 tablet (100 mg total) by mouth every 12 (twelve) hours for 4 days. 8 tablet 0  . gabapentin (NEURONTIN) 300 MG capsule Take 1 capsule (300 mg total) by mouth 3 (three) times daily. 90 capsule 3  . insulin NPH-regular Human (NOVOLIN 70/30) (70-30) 100 UNIT/ML injection Inject 34 Units into the skin 2 (two) times daily with a meal. 60 mL 3  . isosorbide mononitrate (IMDUR) 30 MG 24 hr  tablet Take 1 tablet (30 mg total) by mouth daily. 90 tablet 3  . Magnesium Oxide 200 MG TABS Take 1 tablet (200 mg total) by mouth daily. (Patient taking differently: Take 100 mg by mouth daily. ) 90 tablet 2  . meclizine (ANTIVERT) 25 MG tablet Take 1 tablet (25 mg total) by mouth 3 (three) times daily as needed for dizziness (Take meclizine for spinning dizziness. You may also take Valium if your symptoms are not relieved by the meclizine alone.). 30 tablet 0  . potassium chloride (K-DUR) 10 MEQ tablet Take 2 tablets (20 mEq total) by mouth daily. 30 tablet 5  . sacubitril-valsartan (ENTRESTO) 97-103 MG Take 1 tablet by mouth 2 (two) times daily. 180 tablet 3  . torsemide (DEMADEX) 20 MG tablet Take 2 tablets (40 mg total) by mouth daily. 60 tablet 3  . traMADol (ULTRAM) 50 MG tablet Take 1 tablet (50 mg total) by mouth every 6 (six) hours as needed. (Patient taking differently: Take 50 mg by mouth every 6 (six) hours as needed for moderate pain. ) 20 tablet 0   No current facility-administered medications for this encounter.    Vitals:   01/05/18 1536  BP: 110/90  Pulse: 87  SpO2: 97%  Weight: (!) 319 lb 9.6 oz (145 kg)   Wt Readings from  Last 3 Encounters:  01/05/18 (!) 319 lb 9.6 oz (145 kg)  01/01/18 (!) 320 lb 5.3 oz (145.3 kg)  11/01/17 (!) 330 lb 9.6 oz (150 kg)   Physical Exam General: Well appearing. No resp difficulty. HEENT: Normal Neck: Supple. JVP 5-6. Carotids 2+ bilat; no bruits. No thyromegaly or nodule noted. Cor: PMI nondisplaced. RRR, No M/G/R noted Lungs: CTAB, normal effort. Abdomen: Soft, non-tender, non-distended, no HSM. No bruits or masses. +BS  Extremities: No cyanosis, clubbing, or rash. R and LLE no edema.  Neuro: Alert & orientedx3, cranial nerves grossly intact. moves all 4 extremities w/o difficulty. Affect pleasant   Assessment & Plan 1.  Chronic systolic congestive heart failure: s/p [employer] subcutaneous ICD implant 05/17/15.  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  - Echo 12/31/17 LVEF 25-30%, Moderate MR, mild RV dilation, Severe RAE, Mod TR, Pa peak pressure 67 mm Hg.  - NYHA II symptoms - Continue torsemide 40 mg daily for now.  - Continue Entresto 97/103 mg BID. BMET today. - Off hydralazine with recent AKI and tenuous BP.  - Continue Imdur 30 mg daily.  - Off spiro with recent AKI and tenuous BP.  - Reinforced fluid restriction to < 2 L daily, sodium restriction to less than 2000 mg daily, and the importance of daily weights.   2. Paroxysmal Afib - New diagnosis this 06/2017.  - No bleeding on Eliquis.  - Continue amiodarone 400 mg daily for 2 weeks total, then back to 200 mg daily. Recent LFTs and TSH stable - Will need yearly eye exams. 3. VT with ICD shock 12/29/17 - Continue amiodarone 400 mg daily for 2 weeks as above - No driving until 5/64/33. Pt aware.  4. Hypertension:  - Meds as above.  5. CKD stage III - BMET today. Creatinine 1.48 08/22/17 6. OSA:  - He has a defunct CPAP machine with confirmed diagnosis years ago and poor follow up.  - He will need a repeat sleeps study in order to get a new CPAP machine. Will send referral.  7. Tobacco use: - Former  smoker. No change.  8. Gout:  - Continue allopurinol 200 daily for prophylaxis.  No change.  9. DM2:  - Stable. Per PCP.  10.Renal Cell Carcinoma - S/P nephrectomy 2014.  - BMET today. Follow closely in setting of solitary kidney.  11. Morbid Obesity - Body mass index is 50.06 kg/m.  - Encouraged weight loss.   Meds and labs as above. RTC 4 weeks.  Long discussion about prognosis at today's visit. Recommended focusing on weight loss and medication compliance. He would be a poor LVAD candidate with solitary kidney.   Shirley Friar, PA-C  3:36 PM   Greater than 50% of the 25 minute visit was spent in counseling/coordination of care regarding disease state education, salt/fluid restriction, sliding scale diuretics, and medication compliance.

## 2018-01-06 ENCOUNTER — Encounter (HOSPITAL_COMMUNITY): Payer: Self-pay

## 2018-01-07 MED FILL — ATORVASTATIN 80 MG TABLET: 80 | 30 days supply | Qty: 30 | Fill #4

## 2018-01-07 MED FILL — ISOSORBIDE MN ER 30 MG TAB: 30 | 30 days supply | Qty: 30 | Fill #4

## 2018-01-07 MED FILL — ?ALLOPURINOL 100 MG TABS: 100 | 30 days supply | Qty: 30 | Fill #4

## 2018-01-10 ENCOUNTER — Ambulatory Visit (INDEPENDENT_AMBULATORY_CARE_PROVIDER_SITE_OTHER): Payer: Self-pay | Admitting: *Deleted

## 2018-01-10 DIAGNOSIS — I420 Dilated cardiomyopathy: Secondary | ICD-10-CM

## 2018-01-10 NOTE — Progress Notes (Signed)
Remote ICD transmission.   

## 2018-01-12 ENCOUNTER — Encounter: Payer: Self-pay | Admitting: Internal Medicine

## 2018-01-12 ENCOUNTER — Encounter: Payer: Self-pay | Admitting: Cardiology

## 2018-01-13 MED FILL — GABAPENTIN 300 MG CAPSULE: 300 | 30 days supply | Qty: 90 | Fill #1

## 2018-01-17 MED FILL — POTASSIUM CL ER 10 MEQ TAB: 10 | 15 days supply | Qty: 30 | Fill #1

## 2018-01-18 ENCOUNTER — Ambulatory Visit (INDEPENDENT_AMBULATORY_CARE_PROVIDER_SITE_OTHER): Payer: Self-pay | Admitting: Internal Medicine

## 2018-01-18 ENCOUNTER — Encounter: Payer: Self-pay | Admitting: Internal Medicine

## 2018-01-18 VITALS — BP 100/80 | HR 67 | Ht 67.0 in | Wt 322.0 lb

## 2018-01-18 DIAGNOSIS — I480 Paroxysmal atrial fibrillation: Secondary | ICD-10-CM

## 2018-01-18 DIAGNOSIS — I5022 Chronic systolic (congestive) heart failure: Secondary | ICD-10-CM

## 2018-01-18 DIAGNOSIS — I420 Dilated cardiomyopathy: Secondary | ICD-10-CM

## 2018-01-18 DIAGNOSIS — Z95810 Presence of automatic (implantable) cardiac defibrillator: Secondary | ICD-10-CM

## 2018-01-18 MED FILL — $ELIQUIS 5 MG TABLET: 5 | 30 days supply | Qty: 60 | Fill #2

## 2018-01-18 NOTE — Progress Notes (Signed)
Patient Care Team: Scot Jun, FNP as PCP - General (Family Medicine) Bensimhon, Shaune Pascal, MD as PCP - Cardiology (Cardiology)   HPI  Jeffrey [name] is a 42 y.o. male seen in follow-up for S ICD implanted 8/16 in conjunction with  Dr. 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  Is morbidly obese has hypertension and is status post nephrectomy  The patient denies chest pain nocturnal dyspnea, orthopnea or peripheral edema.  There have been no palpitations, lightheadedness or syncope.  He walks daily for about 15 minutes       Records and Results Reviewed hospital notes  Past Medical History:  Diagnosis Date  . Anxiety   . Aspirin allergy   . Childhood asthma   . Chronic systolic CHF (congestive heart failure) ([place])    a. EF 20-25% in 2012. b. EF 45-50% in 10/2011 with nonischemic nuc - presumed NICM. c. 12/2014 Echo: Sev depressed LV fxn, sev dil LV, mild LVH, mild MR, sev dil LA, mildly reduced RV fxn.  . CKD (chronic kidney disease) stage 2, GFR 60-89 ml/min   . High cholesterol   . Hypertension   . Morbid obesity ([place])   . Nephrolithiasis   . OSA on CPAP   . Presumed NICM    a. 04/2014 Myoview: EF 26%, glob HK, sev glob HK, ? prior infarct;  b. Never cathed 2/2 CKD.  [name] Renal cell carcinoma ([place])    a. s/p Rt robotic assisted partial converted to radical nephrectomy on 01/2013.  . Troponin level elevated    a. 04/2014, 12/2014: felt due to CHF.  . Type II diabetes mellitus ([place])   . Ventricular tachycardia ([place])    a. appropriate ICD therapy 12/2017    Past Surgical History:  Procedure Laterality Date  . APPENDECTOMY  07/2004  . CARDIAC CATHETERIZATION N/A 05/17/2015   Procedure: Right/Left Heart Cath and Coronary Angiography;  Surgeon: Jolaine Artist, MD;  Location: [place] [facility];  Service: Cardiovascular;  Laterality: N/A;  . EP IMPLANTABLE DEVICE N/A 05/17/2015   Procedure: SubQ ICD Implant;  Surgeon: Will Meredith Leeds,  MD;  Location: [place] [facility];  Service: Cardiovascular;  Laterality: N/A;  . ROBOTIC ASSISTED LAPAROSCOPIC LYSIS OF ADHESION  01/13/2013   Procedure: ROBOTIC ASSISTED LAPAROSCOPIC LYSIS OF ADHESION EXTENSIVE;  Surgeon: Alexis Frock, MD;  Location: [facility];  Service: Urology;;  . ROBOTIC ASSITED PARTIAL NEPHRECTOMY Right 01/13/2013   Procedure: ROBOTIC ASSITED PARTIAL NEPHRECTOMY CONVERTED TO ROBOTIC ASSISTED RIGHT RADICAL NEPHRECTOMY;  Surgeon: Alexis Frock, MD;  Location: [facility];  Service: Urology;  Laterality: Right;    Current Outpatient Medications  Medication Sig Dispense Refill  . acetaminophen-codeine (TYLENOL #3) 300-30 MG tablet Take 1 tablet by mouth every 6 (six) hours as needed for moderate pain. 60 tablet 0  . allopurinol (ZYLOPRIM) 100 MG tablet Take 1 tablet (100 mg total) by mouth daily. 90 tablet 3  . amiodarone (PACERONE) 200 MG tablet Take 200 mg by mouth daily.    [name] apixaban (ELIQUIS) 5 MG TABS tablet Take 1 tablet (5 mg total) by mouth 2 (two) times daily. 60 tablet 10  . atorvastatin (LIPITOR) 80 MG tablet TAKE 1 TABLET (80 MG TOTAL) BY MOUTH EVERY EVENING. 90 tablet 3  . colchicine 0.6 MG tablet Take 1 tablet (0.6 mg total) by mouth 2 (two) times daily. (Patient taking differently: Take 0.6 mg by mouth 2 (two) times daily as needed (gout). )  180 tablet 3  . cyclobenzaprine (FLEXERIL) 5 MG tablet Take 1 tablet (5 mg total) 3 (three) times daily as needed by mouth for muscle spasms. 15 tablet 0  . gabapentin (NEURONTIN) 300 MG capsule Take 1 capsule (300 mg total) by mouth 3 (three) times daily. 90 capsule 3  . insulin NPH-regular Human (NOVOLIN 70/30) (70-30) 100 UNIT/ML injection Inject 34 Units into the skin 2 (two) times daily with a meal. 60 mL 3  . isosorbide mononitrate (IMDUR) 30 MG 24 hr tablet Take 1 tablet (30 mg total) by mouth daily. 90 tablet 3  . Magnesium Oxide 200 MG TABS Take 1 tablet (200 mg total) by mouth daily. (Patient taking differently: Take 100  mg by mouth daily. ) 90 tablet 2  . meclizine (ANTIVERT) 25 MG tablet Take 1 tablet (25 mg total) by mouth 3 (three) times daily as needed for dizziness (Take meclizine for spinning dizziness. You may also take Valium if your symptoms are not relieved by the meclizine alone.). 30 tablet 0  . potassium chloride (K-DUR) 10 MEQ tablet Take 2 tablets (20 mEq total) by mouth daily. 30 tablet 5  . sacubitril-valsartan (ENTRESTO) 97-103 MG Take 1 tablet by mouth 2 (two) times daily. 180 tablet 3  . torsemide (DEMADEX) 20 MG tablet Take 2 tablets (40 mg total) by mouth daily. 60 tablet 3  . traMADol (ULTRAM) 50 MG tablet Take 1 tablet (50 mg total) by mouth every 6 (six) hours as needed. (Patient taking differently: Take 50 mg by mouth every 6 (six) hours as needed for moderate pain. ) 20 tablet 0   No current facility-administered medications for this visit.     Allergies  Allergen Reactions  . Aspirin Shortness Of Breath, Itching and Rash     Burning sensation (Patient reports he tolerates other NSAIDS)   . Bee Venom Hives and Swelling  . Lisinopril Cough  . Tomato Rash      Review of Systems negative except from HPI and PMH  Physical Exam BP 100/80   Pulse 67   Ht 5\' 7"  (1.702 m)   Wt (!) 322 lb (146.1 kg)   SpO2 96%   BMI 50.43 kg/m  Well developed and nourished in no acute distress HENT normal Neck supple with JVP-flat Clear Device pocket well healed; without hematoma or erythema.  There is no tethering  Regular rate and rhythm, no murmurs or gallops Abd-soft with active BS No Clubbing cyanosis edema Skin-warm and dry A & Oriented  Grossly normal sensory and motor function   ECG demonstrates sinus at 67 Intervals 20/11/43  Assessment and  Plan  Nonischemic cardiomyopathy  Congestive heart failure-chronic-systolic-class II  Morbid obesity  hypertension   ventricular tachycardia-nonsustained  ICD-subcutaneous      Euvolemic continue current meds  Continue  current afterload meds but without BP to incrase meds

## 2018-01-18 NOTE — Patient Instructions (Signed)
Medication Instructions:  Your physician recommends that you continue on your current medications as directed. Please refer to the Current Medication list given to you today.  Labwork: None ordered.  Testing/Procedures: None ordered.  Follow-Up: Your physician recommends that you schedule a follow-up appointment in: One Year with Renee  Remote monitoring is used to monitor your Pacemaker of ICD from home. This monitoring reduces the number of office visits required to check your device to one time per year. It allows [place] to keep an eye on the functioning of your device to ensure it is working properly. You are scheduled for a device check from home on 7/8. You may send your transmission at any time that day. If you have a wireless device, the transmission will be sent automatically. After your physician reviews your transmission, you will receive a postcard with your next transmission date.   Any Other Special Instructions Will Be Listed Below (If Applicable).     If you need a refill on your cardiac medications before your next appointment, please call your pharmacy.

## 2018-01-19 LAB — CUP PACEART INCLINIC DEVICE CHECK
MDC IDC LEAD IMPLANT DT: 20160812
MDC IDC LEAD LOCATION: 753862
MDC IDC PG IMPLANT DT: 20160812
MDC IDC SESS DTM: 20190417071143
Pulse Gen Serial Number: 117162

## 2018-01-24 MED FILL — FERROUS SULFATE 325 MG TAB: 325 (65 FE) | 30 days supply | Qty: 60 | Fill #0

## 2018-01-24 MED FILL — ALLOPURINOL 100 MG TABLET: 100 | 30 days supply | Qty: 60 | Fill #0

## 2018-01-24 MED FILL — VIT D2 1.25 MG (50,000 UNIT: 1.25 MG | 84 days supply | Qty: 12 | Fill #0

## 2018-01-28 ENCOUNTER — Other Ambulatory Visit (HOSPITAL_COMMUNITY): Payer: Self-pay

## 2018-01-31 ENCOUNTER — Ambulatory Visit (HOSPITAL_COMMUNITY)
Admission: RE | Admit: 2018-01-31 | Discharge: 2018-01-31 | Disposition: A | Discharge status: Home / Self Care | Payer: Medicaid Other | Source: Ambulatory Visit | Attending: Nephrology | Admitting: Nephrology

## 2018-01-31 DIAGNOSIS — D631 Anemia in chronic kidney disease: Secondary | ICD-10-CM | POA: Insufficient documentation

## 2018-01-31 DIAGNOSIS — N189 Chronic kidney disease, unspecified: Secondary | ICD-10-CM | POA: Diagnosis present

## 2018-01-31 MED ORDER — FERUMOXYTOL INJECTION 510 MG/17 ML
510.0000 mg | INTRAVENOUS | Status: DC
  Administered 2018-01-31: 11:00:00 510 mg via INTRAVENOUS
  Filled 2018-01-31: qty 17

## 2018-02-02 LAB — CUP PACEART REMOTE DEVICE CHECK
Battery Remaining Percentage: 69 %
Implantable Lead Implant Date: 20160812
Implantable Pulse Generator Implant Date: 20160812
MDC IDC LEAD LOCATION: 753862
MDC IDC PG SERIAL: 117162
MDC IDC SESS DTM: 20190408150000

## 2018-02-03 ENCOUNTER — Encounter (HOSPITAL_COMMUNITY): Payer: Self-pay | Admitting: Internal Medicine

## 2018-02-07 ENCOUNTER — Ambulatory Visit (HOSPITAL_COMMUNITY): Admission: RE | Admit: 2018-02-07 | Payer: Self-pay | Source: Ambulatory Visit

## 2018-02-07 MED FILL — AMIODARONE HCL 200 MG TAB: 200 | 42 days supply | Qty: 42 | Fill #1

## 2018-02-07 MED FILL — TORSEMIDE 20 MG TABS: 20 | 30 days supply | Qty: 60 | Fill #0

## 2018-02-10 ENCOUNTER — Emergency Department (HOSPITAL_COMMUNITY): Payer: Medicaid Other

## 2018-02-10 ENCOUNTER — Other Ambulatory Visit: Payer: Self-pay

## 2018-02-10 ENCOUNTER — Emergency Department (HOSPITAL_COMMUNITY)
Admission: EM | Admit: 2018-02-10 | Discharge: 2018-02-10 | Disposition: A | Discharge status: Home / Self Care | Payer: Medicaid Other | Attending: Emergency Medicine | Admitting: Emergency Medicine

## 2018-02-10 ENCOUNTER — Encounter (HOSPITAL_COMMUNITY)
Admission: RE | Admit: 2018-02-10 | Discharge: 2018-02-10 | Disposition: A | Discharge status: Home / Self Care | Payer: Medicaid Other | Source: Ambulatory Visit | Attending: Nephrology | Admitting: Nephrology

## 2018-02-10 ENCOUNTER — Encounter (HOSPITAL_COMMUNITY): Payer: Self-pay | Admitting: Emergency Medicine

## 2018-02-10 DIAGNOSIS — J45909 Unspecified asthma, uncomplicated: Secondary | ICD-10-CM | POA: Diagnosis not present

## 2018-02-10 DIAGNOSIS — Z87891 Personal history of nicotine dependence: Secondary | ICD-10-CM | POA: Insufficient documentation

## 2018-02-10 DIAGNOSIS — D631 Anemia in chronic kidney disease: Secondary | ICD-10-CM | POA: Diagnosis present

## 2018-02-10 DIAGNOSIS — I5023 Acute on chronic systolic (congestive) heart failure: Secondary | ICD-10-CM | POA: Diagnosis not present

## 2018-02-10 DIAGNOSIS — R002 Palpitations: Secondary | ICD-10-CM | POA: Insufficient documentation

## 2018-02-10 DIAGNOSIS — E1122 Type 2 diabetes mellitus with diabetic chronic kidney disease: Secondary | ICD-10-CM | POA: Diagnosis not present

## 2018-02-10 DIAGNOSIS — N189 Chronic kidney disease, unspecified: Secondary | ICD-10-CM | POA: Diagnosis not present

## 2018-02-10 DIAGNOSIS — Z794 Long term (current) use of insulin: Secondary | ICD-10-CM | POA: Insufficient documentation

## 2018-02-10 DIAGNOSIS — N183 Chronic kidney disease, stage 3 (moderate): Secondary | ICD-10-CM | POA: Insufficient documentation

## 2018-02-10 DIAGNOSIS — Z79899 Other long term (current) drug therapy: Secondary | ICD-10-CM | POA: Insufficient documentation

## 2018-02-10 DIAGNOSIS — I130 Hypertensive heart and chronic kidney disease with heart failure and stage 1 through stage 4 chronic kidney disease, or unspecified chronic kidney disease: Secondary | ICD-10-CM | POA: Insufficient documentation

## 2018-02-10 LAB — BASIC METABOLIC PANEL
Anion gap: 8 (ref 5–15)
BUN: 20 mg/dL (ref 6–20)
CALCIUM: 9.3 mg/dL (ref 8.9–10.3)
CO2: 22 mmol/L (ref 22–32)
CREATININE: 1.61 mg/dL — AB (ref 0.61–1.24)
Chloride: 110 mmol/L (ref 101–111)
GFR calc Af Amer: 59 mL/min — ABNORMAL LOW (ref >60)
GFR, EST NON AFRICAN AMERICAN: 51 mL/min — AB (ref >60)
GLUCOSE: 146 mg/dL — AB (ref 65–99)
Potassium: 3.6 mmol/L (ref 3.5–5.1)
SODIUM: 140 mmol/L (ref 135–145)

## 2018-02-10 LAB — CBC
HEMATOCRIT: 44.1 % (ref 39.0–52.0)
Hemoglobin: 14.4 g/dL (ref 13.0–17.0)
MCH: 24.5 pg — ABNORMAL LOW (ref 26.0–34.0)
MCHC: 32.7 g/dL (ref 30.0–36.0)
MCV: 75.1 fL — ABNORMAL LOW (ref 78.0–100.0)
PLATELETS: 208 10*3/uL (ref 150–400)
RBC: 5.87 MIL/uL — ABNORMAL HIGH (ref 4.22–5.81)
RDW: 22 % — ABNORMAL HIGH (ref 11.5–15.5)
WBC: 6.6 10*3/uL (ref 4.0–10.5)

## 2018-02-10 LAB — I-STAT TROPONIN, ED: Troponin i, poc: 0 ng/mL (ref 0.00–0.08)

## 2018-02-10 LAB — T4, FREE: Free T4: 0.96 ng/dL (ref 0.82–1.77)

## 2018-02-10 LAB — TSH: TSH: 2.248 u[IU]/mL (ref 0.350–4.500)

## 2018-02-10 LAB — BRAIN NATRIURETIC PEPTIDE: B NATRIURETIC PEPTIDE 5: 706.8 pg/mL — AB (ref 0.0–100.0)

## 2018-02-10 MED ORDER — SODIUM CHLORIDE 0.9 % IV SOLN
510.0000 mg | INTRAVENOUS | Status: AC
  Administered 2018-02-10: 510 mg via INTRAVENOUS
  Filled 2018-02-10: qty 17

## 2018-02-10 NOTE — Discharge Instructions (Addendum)
Your lab work looked ok today. I spoke to Dr. Dani Gobble Croitoru from [facility] who recommends discharging you with increase of amiodarone back to 400 mg daily.  Start this today.  The [facility] fib clinic will call you by noon tomorrow to make an appointment.  If you do not hear back from them please call them and attempt to schedule follow-up with cardiology.  Return for any signs of increased fluid in your body such as cough, shortness of breath, chest tightness or pain, lower extremity swelling, abdominal swelling.

## 2018-02-10 NOTE — ED Provider Notes (Signed)
[facility] EMERGENCY DEPARTMENT Provider Note   CSN: [id number] Arrival date & time: 02/10/18  1119     History   Chief Complaint Chief Complaint  Patient presents with  . Irregular Heart Beat    HPI Jeffrey [name] is a 42 y.o. male w/ h/o paroxysmal a-fib on amiodorone and eloquis, congestive HF EF 25%, BS ICD, obesity, HTN, right renal carcinoma s/ nephrectomy, diabetes here for evaluation of fluttering sensation to central chest x 2 days.  Onset is intermittent, random, at rest and on exertion.  He had finished iron transfusion and RN noticed HR fluctuating up into 120s, he was advised to come to ER.  Has been more active lately, walking more (up to 25 min daily) and doing things around the house, has been sweating more. Has been compliant with medications.    He denies fevers, cough, exertional CP or SOB, orthopnea, LE edema, calf tenderness. Cardiologist Dr [name].  HPI  Past Medical History:  Diagnosis Date  . Anxiety   . Aspirin allergy   . Childhood asthma   . Chronic systolic CHF (congestive heart failure) ([place])    a. EF 20-25% in 2012. b. EF 45-50% in 10/2011 with nonischemic nuc - presumed NICM. c. 12/2014 Echo: Sev depressed LV fxn, sev dil LV, mild LVH, mild MR, sev dil LA, mildly reduced RV fxn.  . CKD (chronic kidney disease) stage 2, GFR 60-89 ml/min   . High cholesterol   . Hypertension   . Morbid obesity ([place])   . Nephrolithiasis   . OSA on CPAP   . Presumed NICM    a. 04/2014 Myoview: EF 26%, glob HK, sev glob HK, ? prior infarct;  b. Never cathed 2/2 CKD.  [name] Renal cell carcinoma ([place])    a. s/p Rt robotic assisted partial converted to radical nephrectomy on 01/2013.  . Troponin level elevated    a. 04/2014, 12/2014: felt due to CHF.  . Type II diabetes mellitus ([place])   . Ventricular tachycardia ([place])    a. appropriate ICD therapy 12/2017    Patient Active Problem List   Diagnosis Date Noted  . Ventricular tachycardia ([place]) 12/30/2017  .  PAF (paroxysmal atrial fibrillation) ([place])   . Dilated cardiomyopathy ([place])   . Pollen allergy 02/17/2017  . Dyslipidemia 02/17/2017  . Hematochezia 11/08/2015  . CKD (chronic kidney disease), stage III ([place]) 06/20/2015  . Cardiac device in situ, other   . Acute on chronic systolic (congestive) heart failure ([place]) 03/11/2015  . Gouty arthritis 03/11/2015  . Chest pain 12/28/2014  . Aspirin allergy 04/08/2014  . Renal cell carcinoma ([place])   . Gout attack 11/10/2011  . Morbid obesity ([place]) 10/23/2011  . OSA (obstructive sleep apnea) 10/23/2011  . Type 2 diabetes mellitus with stage 3 chronic kidney disease, with long-term current use of insulin ([place]) 10/22/2011  . [place], MILD 12/25/2010  . HYPERTENSION, BENIGN ESSENTIAL 12/25/2010    Past Surgical History:  Procedure Laterality Date  . APPENDECTOMY  07/2004  . CARDIAC CATHETERIZATION N/A 05/17/2015   Procedure: Right/Left Heart Cath and Coronary Angiography;  Surgeon: Jolaine Artist, MD;  Location: [facility];  Service: Cardiovascular;  Laterality: N/A;  . EP IMPLANTABLE DEVICE N/A 05/17/2015   Procedure: SubQ ICD Implant;  Surgeon: Will Meredith Leeds, MD;  Location: [facility];  Service: Cardiovascular;  Laterality: N/A;  . ROBOTIC ASSISTED LAPAROSCOPIC LYSIS OF ADHESION  01/13/2013   Procedure: ROBOTIC ASSISTED LAPAROSCOPIC LYSIS OF ADHESION EXTENSIVE;  Surgeon: Alexis Frock, MD;  Location: [facility];  Service: Urology;;  . ROBOTIC ASSITED PARTIAL NEPHRECTOMY Right 01/13/2013   Procedure: ROBOTIC ASSITED PARTIAL NEPHRECTOMY CONVERTED TO ROBOTIC ASSISTED RIGHT RADICAL NEPHRECTOMY;  Surgeon: Alexis Frock, MD;  Location: [facility];  Service: Urology;  Laterality: Right;        Home Medications    Prior to Admission medications   Medication Sig Start Date End Date Taking? Authorizing Provider  allopurinol (ZYLOPRIM) 100 MG tablet Take 1 tablet (100 mg total) by mouth daily. 02/17/17  Yes Tresa Garter, MD  amiodarone (PACERONE) 200 MG tablet Take 200 mg by mouth daily.   Yes [provider]  apixaban (ELIQUIS) 5 MG TABS tablet Take 1 tablet (5 mg total) by mouth 2 (two) times daily. 06/13/17  Yes Simmons, Brittainy M, PA-C  atorvastatin (LIPITOR) 80 MG tablet TAKE 1 TABLET (80 MG TOTAL) BY MOUTH EVERY EVENING. 02/17/17  Yes Jegede, Olugbemiga E, MD  colchicine 0.6 MG tablet Take 1 tablet (0.6 mg total) by mouth 2 (two) times daily. Patient taking differently: Take 0.6 mg by mouth 2 (two) times daily as needed (gout).  02/17/17  Yes Tresa Garter, MD  cyclobenzaprine (FLEXERIL) 5 MG tablet Take 1 tablet (5 mg total) 3 (three) times daily as needed by mouth for muscle spasms. 08/22/17  Yes Lovenia Kim, MD  gabapentin (NEURONTIN) 300 MG capsule Take 1 capsule (300 mg total) by mouth 3 (three) times daily. 11/01/17  Yes Scot Jun, FNP  insulin NPH-regular Human (NOVOLIN 70/30) (70-30) 100 UNIT/ML injection Inject 34 Units into the skin 2 (two) times daily with a meal. 07/09/17  Yes Jegede, Olugbemiga E, MD  isosorbide mononitrate (IMDUR) 30 MG 24 hr tablet Take 1 tablet (30 mg total) by mouth daily. 06/23/17  Yes Shirley Friar, PA-C  Magnesium Oxide 200 MG TABS Take 1 tablet (200 mg total) by mouth daily. Patient taking differently: Take 100 mg by mouth daily.  01/19/17  Yes Shirley Friar, PA-C  meclizine (ANTIVERT) 25 MG tablet Take 1 tablet (25 mg total) by mouth 3 (three) times daily as needed for dizziness (Take meclizine for spinning dizziness. You may also take Valium if your symptoms are not relieved by the meclizine alone.). 02/11/16  Yes Pfeiffer, Jeannie Done, MD  potassium chloride (K-DUR) 10 MEQ tablet Take 2 tablets (20 mEq total) by mouth daily. 01/01/18  Yes Daune Perch, NP  sacubitril-valsartan (ENTRESTO) 97-103 MG Take 1 tablet by mouth 2 (two) times daily. 09/09/17  Yes Tresa Garter, MD  torsemide (DEMADEX) 20 MG tablet Take 2 tablets (40 mg  total) by mouth daily. 01/03/18  Yes Bensimhon, Shaune Pascal, MD  acetaminophen-codeine (TYLENOL #3) 300-30 MG tablet Take 1 tablet by mouth every 6 (six) hours as needed for moderate pain. Patient not taking: Reported on 02/10/2018 11/01/17   Scot Jun, FNP  traMADol (ULTRAM) 50 MG tablet Take 1 tablet (50 mg total) by mouth every 6 (six) hours as needed. Patient not taking: Reported on 02/10/2018 06/05/17   Lajean Saver, MD    Family History Family History  Problem Relation Age of Onset  . Hypertension Mother   . Diabetes Maternal Aunt   . Heart attack Neg Hx   . Stroke Neg Hx     Social History Social History   Tobacco Use  . Smoking status: Former Smoker    Packs/day: 0.25    Years: 22.00    Pack years: 5.50    Last  attempt to quit: 04/16/2015    Years since quitting: 2.8  . Smokeless tobacco: Never Used  Substance Use Topics  . Alcohol use: No    Comment: occ   . Drug use: Yes    Frequency: 14.0 times per week    Types: Marijuana    Comment: uses marijuana twice a day     Allergies   Aspirin; Bee venom; Lisinopril; and Tomato   Review of Systems Review of Systems  Cardiovascular: Positive for palpitations.       Elevated HR  All other systems reviewed and are negative.    Physical Exam Updated Vital Signs BP (!) 116/92   Pulse (!) 103   Temp 99.2 F (37.3 C) (Oral)   Resp 18   SpO2 99%   Physical Exam  Constitutional: He appears well-developed and well-nourished.  NAD. Non toxic.   HENT:  Head: Normocephalic and atraumatic.  Nose: Nose normal.  Moist mucous membranes. Tonsils and oropharynx normal  Eyes: Conjunctivae, EOM and lids are normal.  Neck: Trachea normal and normal range of motion.  Trachea midline. No cervical adenopathy  Cardiovascular: Normal rate, regular rhythm, S1 normal, S2 normal and normal heart sounds.  Pulses:      Carotid pulses are 2+ on the right side, and 2+ on the left side.      Radial pulses are 2+ on the right side,  and 2+ on the left side.       Dorsalis pedis pulses are 2+ on the right side, and 2+ on the left side.  HR 88-120s. No LE edema or calf tenderness.   Pulmonary/Chest: Effort normal and breath sounds normal. No respiratory distress. He has no decreased breath sounds. He has no rhonchi.  No reproducible chest wall tenderness. CP not reproducible with AROM of upper extremities. No rales or wheezing.  Abdominal: Soft. Bowel sounds are normal. There is no tenderness.  No epigastric tenderness. NTND  Neurological: He is alert. GCS eye subscore is 4. GCS verbal subscore is 5. GCS motor subscore is 6.  Skin: Skin is warm and dry. Capillary refill takes less than 2 seconds.  No rash to chest wall  Psychiatric: He has a normal mood and affect. His speech is normal and behavior is normal. Judgment and thought content normal. Cognition and memory are normal.     ED Treatments / Results  Labs (all labs ordered are listed, but only abnormal results are displayed) Labs Reviewed  BASIC METABOLIC PANEL - Abnormal; Notable for the following components:      Result Value   Glucose, Bld 146 (*)    Creatinine, Ser 1.61 (*)    GFR calc non Af Amer 51 (*)    GFR calc Af Amer 59 (*)    All other components within normal limits  CBC - Abnormal; Notable for the following components:   RBC 5.87 (*)    MCV 75.1 (*)    MCH 24.5 (*)    RDW 22.0 (*)    All other components within normal limits  BRAIN NATRIURETIC PEPTIDE - Abnormal; Notable for the following components:   B Natriuretic Peptide 706.8 (*)    All other components within normal limits  TSH  T4, FREE  I-STAT TROPONIN, ED    EKG EKG Interpretation  Date/Time:  Thursday Feb 10 2018 11:25:30 EDT Ventricular Rate:  121 PR Interval:    QRS Duration: 110 QT Interval:  368 QTC Calculation: 522 R Axis:   119 Text Interpretation:  Atrial fibrillation with rapid ventricular response with premature ventricular or aberrantly conducted complexes  Right axis deviation Incomplete left bundle branch block Abnormal ECG Otherwise no significant change Confirmed by Deno Etienne [phone]) on 02/10/2018 3:43:47 PM   Radiology Dg Chest 2 View  Result Date: 02/10/2018 CLINICAL DATA:  Shortness of breath EXAM: CHEST - 2 VIEW COMPARISON:  3/27/9 FINDINGS: Cardiac shadow is at the upper limits of normal in size. Defibrillator is noted anteriorly. Lungs are well aerated bilaterally without focal infiltrate or sizable effusion. No significant edema or vascular congestion is noted. No bony abnormality is seen. IMPRESSION: No acute abnormality noted. Electronically Signed   By: [name] Catalina M.D.   On: 02/10/2018 12:12    Procedures Procedures (including critical care time)  Medications Ordered in ED Medications - No data to display   Initial Impression / Assessment and Plan / ED Course  I have reviewed the triage vital signs and the nursing notes.  Pertinent labs & imaging results that were available during my care of the patient were reviewed by me and considered in my medical decision making (see chart for details).  Clinical Course as of Feb 10 2333  Thu Feb 10, 2018  1639 Creatinine(!): 1.61 [CG]  1639 GFR, Est African American(!): 59 [CG]  1919 B Natriuretic Peptide(!): 706.8 [CG]    Clinical Course User Index [CG] Kinnie Feil, PA-C   42 year old male with history of paroxysmal atrial fibrillation on amiodarone 200 mg, Eliquis, heart failure EF 25% with [employer] here for palpitations for 2 days, noted to have fluctuating heart rates from 80s to 120s after receiving iron infusion.  He has no chest pain, cough, orthopnea, lower extremity edema, recent weight gain.  Has been exercising and sweating more for weight loss.  Has been compliant with medications.  Decreased amiadorone approx 3 weeks ago per cardiology.   Exam remarkable for A. fib heart rates below 120s.  He does not look fluid overloaded.  Lab work, chest x-ray, EKG,,  troponin, TSH, T4, BNP ordered and reviewed.  Remarkable for creatinine 1.61 at baseline.  BNP 706, not significantly elevated compared to previous.  Spoke to Dr. Dani Gobble Croitoru from cardiology who reviewed patient's chart.  Recommends increasing amiodarone back to 400 mg daily starting today.  He does not think patient needs admission for this or cardioversion at this time as he looks euvolemic.  Patient to follow-up with A. fib clinic in the next 24 hours.  I discussed this plan with patient who is in agreement.  Patient discussed with Dr. Tyrone Nine.  I gave patient strict return precautions such as exertional chest pain, shortness of breath, cough, orthopnea, lower extremity edema or any other signs of fluid overload.  Final Clinical Impressions(s) / ED Diagnoses   Final diagnoses:  Palpitations    ED Discharge Orders    None       Kinnie Feil, PA-C 02/10/18 [place], [place], DO 02/10/18 2347

## 2018-02-10 NOTE — Progress Notes (Signed)
Presented to [facility] with complaints of chest fluttering, but without syncope, dyspnea, edema or other suggestion of CHF exacerbation. ECG shows AFib w RVR. Has had AFib initially diagnosed in September 2018, converted to NSR on amiodarone. Amiodarone dose was decreased to 200 mg daily. Takes Eliquis. He is not on a beta blocker.  He has an S-ICD, no atrial channel (but April 11 download showed NSR 80 bpm, distinct P waves).  Asked ED staff to increase his amiodarone to 400 mg daily and will arrange follow up in AFib clinic next week. Could add beta blocker if still in AF w RVR and discuss elective DCCV.  Sanda Klein, MD, [facility] [phone] office [phone] pager

## 2018-02-10 NOTE — ED Triage Notes (Signed)
Patient states he was receiving an iron infusion when he was told by the infusion nurse that he was in atrial fibulation and so he came to ED for evaluation. Denies chest pain, shortness of breath.

## 2018-02-11 MED FILL — ISOSORBIDE MN ER 30 MG TAB: 30 | 30 days supply | Qty: 30 | Fill #5

## 2018-02-15 ENCOUNTER — Encounter (HOSPITAL_COMMUNITY): Payer: Self-pay | Admitting: Nurse Practitioner

## 2018-02-15 ENCOUNTER — Ambulatory Visit (HOSPITAL_COMMUNITY)
Admission: RE | Admit: 2018-02-15 | Discharge: 2018-02-15 | Disposition: A | Discharge status: Home / Self Care | Payer: Medicaid Other | Source: Ambulatory Visit | Attending: Nurse Practitioner | Admitting: Nurse Practitioner

## 2018-02-15 VITALS — BP 110/58 | HR 76 | Ht 67.0 in | Wt 321.0 lb

## 2018-02-15 DIAGNOSIS — Z794 Long term (current) use of insulin: Secondary | ICD-10-CM | POA: Diagnosis not present

## 2018-02-15 DIAGNOSIS — I5022 Chronic systolic (congestive) heart failure: Secondary | ICD-10-CM | POA: Insufficient documentation

## 2018-02-15 DIAGNOSIS — E7800 Pure hypercholesterolemia, unspecified: Secondary | ICD-10-CM | POA: Insufficient documentation

## 2018-02-15 DIAGNOSIS — I480 Paroxysmal atrial fibrillation: Secondary | ICD-10-CM | POA: Insufficient documentation

## 2018-02-15 DIAGNOSIS — F419 Anxiety disorder, unspecified: Secondary | ICD-10-CM | POA: Insufficient documentation

## 2018-02-15 DIAGNOSIS — I110 Hypertensive heart disease with heart failure: Secondary | ICD-10-CM | POA: Diagnosis not present

## 2018-02-15 DIAGNOSIS — G4733 Obstructive sleep apnea (adult) (pediatric): Secondary | ICD-10-CM | POA: Diagnosis not present

## 2018-02-15 DIAGNOSIS — Z79899 Other long term (current) drug therapy: Secondary | ICD-10-CM | POA: Diagnosis not present

## 2018-02-15 DIAGNOSIS — E1122 Type 2 diabetes mellitus with diabetic chronic kidney disease: Secondary | ICD-10-CM | POA: Diagnosis not present

## 2018-02-15 DIAGNOSIS — Z833 Family history of diabetes mellitus: Secondary | ICD-10-CM | POA: Insufficient documentation

## 2018-02-15 DIAGNOSIS — Z905 Acquired absence of kidney: Secondary | ICD-10-CM | POA: Diagnosis not present

## 2018-02-15 DIAGNOSIS — Z79891 Long term (current) use of opiate analgesic: Secondary | ICD-10-CM | POA: Insufficient documentation

## 2018-02-15 DIAGNOSIS — Z9889 Other specified postprocedural states: Secondary | ICD-10-CM | POA: Insufficient documentation

## 2018-02-15 DIAGNOSIS — Z85528 Personal history of other malignant neoplasm of kidney: Secondary | ICD-10-CM | POA: Diagnosis not present

## 2018-02-15 DIAGNOSIS — Z7901 Long term (current) use of anticoagulants: Secondary | ICD-10-CM | POA: Diagnosis not present

## 2018-02-15 DIAGNOSIS — Z87891 Personal history of nicotine dependence: Secondary | ICD-10-CM | POA: Insufficient documentation

## 2018-02-15 DIAGNOSIS — Z8249 Family history of ischemic heart disease and other diseases of the circulatory system: Secondary | ICD-10-CM | POA: Diagnosis not present

## 2018-02-15 NOTE — Progress Notes (Signed)
Primary Care Physician: Jeffrey Jun, FNP Referring Physician: Dr.  Recardo [name] EP: Dr. Caryl [name] AHF: Dr. Elinor [name] is a 42 y.o. 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 He was seen in the ER 5/9 for chest flutters. HR was in there 120's in afib. Dr. Loletha Grayer saw on consult and increased amiodarone to 400 mg daily and referred to afib clinic.  In clinic, pt is back in SR. He converted 2 days after ER visit. No triggers identified for breakthrough afib. On eliquis 5 mg bid for CHA2DS2VASc score of at least 3.  Today, he denies symptoms of palpitations, chest pain, shortness of breath, orthopnea, PND, lower extremity edema, dizziness, presyncope, syncope, or neurologic sequela. The patient is tolerating medications without difficulties and is otherwise without complaint today.   Past Medical History:  Diagnosis Date  . Anxiety   . Aspirin allergy   . Childhood asthma   . Chronic systolic CHF (congestive heart failure) ([place])    a. EF 20-25% in 2012. b. EF 45-50% in 10/2011 with nonischemic nuc - presumed NICM. c. 12/2014 Echo: Sev depressed LV fxn, sev dil LV, mild LVH, mild MR, sev dil LA, mildly reduced RV fxn.  . CKD (chronic kidney disease) stage 2, GFR 60-89 ml/min   . High cholesterol   . Hypertension   . Morbid obesity ([place])   . Nephrolithiasis   . OSA on CPAP   . Presumed NICM    a. 04/2014 Myoview: EF 26%, glob HK, sev glob HK, ? prior infarct;  b. Never cathed 2/2 CKD.  [name] Renal cell carcinoma ([place])    a. s/p Rt robotic assisted partial converted to radical nephrectomy on 01/2013.  . Troponin level elevated    a. 04/2014, 12/2014: felt due to CHF.  . Type II diabetes mellitus ([place])   . Ventricular tachycardia ([place])    a. appropriate ICD therapy 12/2017   Past Surgical History:  Procedure Laterality Date  . APPENDECTOMY   07/2004  . CARDIAC CATHETERIZATION N/A 05/17/2015   Procedure: Right/Left Heart Cath and Coronary Angiography;  Surgeon: Jolaine Artist, MD;  Location: [facility];  Service: Cardiovascular;  Laterality: N/A;  . EP IMPLANTABLE DEVICE N/A 05/17/2015   Procedure: SubQ ICD Implant;  Surgeon: Will Meredith Leeds, MD;  Location: [facility];  Service: Cardiovascular;  Laterality: N/A;  . ROBOTIC ASSISTED LAPAROSCOPIC LYSIS OF ADHESION  01/13/2013   Procedure: ROBOTIC ASSISTED LAPAROSCOPIC LYSIS OF ADHESION EXTENSIVE;  Surgeon: Alexis Frock, MD;  [facility];  Service: Urology;;  . ROBOTIC ASSITED PARTIAL NEPHRECTOMY Right 01/13/2013   Procedure: ROBOTIC ASSITED PARTIAL NEPHRECTOMY CONVERTED TO ROBOTIC ASSISTED RIGHT RADICAL NEPHRECTOMY;  Surgeon: Alexis Frock, MD;  [facility];  Service: Urology;  Laterality: Right;    Current Outpatient Medications  Medication Sig Dispense Refill  . allopurinol (ZYLOPRIM) 100 MG tablet Take 1 tablet (100 mg total) by mouth daily. 90 tablet 3  . amiodarone (PACERONE) 200 MG tablet Take 200 mg by mouth daily.    [name] apixaban (ELIQUIS) 5 MG TABS tablet Take 1 tablet (5 mg total) by mouth 2 (two) times daily. 60 tablet 10  . atorvastatin (LIPITOR) 80 MG tablet TAKE 1 TABLET (80 MG TOTAL) BY MOUTH EVERY EVENING. 90 tablet 3  . colchicine 0.6 MG tablet Take 1 tablet (0.6 mg total) by mouth  2 (two) times daily. (Patient taking differently: Take 0.6 mg by mouth 2 (two) times daily as needed (gout). ) 180 tablet 3  . gabapentin (NEURONTIN) 300 MG capsule Take 1 capsule (300 mg total) by mouth 3 (three) times daily. 90 capsule 3  . insulin NPH-regular Human (NOVOLIN 70/30) (70-30) 100 UNIT/ML injection Inject 34 Units into the skin 2 (two) times daily with a meal. 60 mL 3  . isosorbide mononitrate (IMDUR) 30 MG 24 hr tablet Take 1 tablet (30 mg total) by mouth daily. 90 tablet 3  . Magnesium Oxide 200 MG TABS Take 1 tablet (200 mg total) by mouth  daily. (Patient taking differently: Take 100 mg by mouth daily. ) 90 tablet 2  . meclizine (ANTIVERT) 25 MG tablet Take 1 tablet (25 mg total) by mouth 3 (three) times daily as needed for dizziness (Take meclizine for spinning dizziness. You may also take Valium if your symptoms are not relieved by the meclizine alone.). 30 tablet 0  . potassium chloride (K-DUR) 10 MEQ tablet Take 2 tablets (20 mEq total) by mouth daily. 30 tablet 5  . sacubitril-valsartan (ENTRESTO) 97-103 MG Take 1 tablet by mouth 2 (two) times daily. 180 tablet 3  . torsemide (DEMADEX) 20 MG tablet Take 2 tablets (40 mg total) by mouth daily. 60 tablet 3  . acetaminophen-codeine (TYLENOL #3) 300-30 MG tablet Take 1 tablet by mouth every 6 (six) hours as needed for moderate pain. (Patient not taking: Reported on 02/10/2018) 60 tablet 0  . cyclobenzaprine (FLEXERIL) 5 MG tablet Take 1 tablet (5 mg total) 3 (three) times daily as needed by mouth for muscle spasms. 15 tablet 0  . traMADol (ULTRAM) 50 MG tablet Take 1 tablet (50 mg total) by mouth every 6 (six) hours as needed. (Patient not taking: Reported on 02/10/2018) 20 tablet 0   No current facility-administered medications for this encounter.     Allergies  Allergen Reactions  . Aspirin Shortness Of Breath, Itching and Rash     Burning sensation (Patient reports he tolerates other NSAIDS)   . Bee Venom Hives and Swelling  . Lisinopril Cough  . Tomato Rash    Social History   Socioeconomic History  . Marital status: Significant Other    Spouse name: Not on file  . Number of children: Not on file  . Years of education: Not on file  . Highest education level: Not on file  Occupational History  . Not on file  Social Needs  . Financial resource strain: Not on file  . Food insecurity:    Worry: Not on file    Inability: Not on file  . Transportation needs:    Medical: Not on file    Non-medical: Not on file  Tobacco Use  . Smoking status: Former Smoker     Packs/day: 0.25    Years: 22.00    Pack years: 5.50    Last attempt to quit: 04/16/2015    Years since quitting: 2.8  . Smokeless tobacco: Never Used  Substance and Sexual Activity  . Alcohol use: No    Comment: occ   . Drug use: Yes    Frequency: 14.0 times per week    Types: Marijuana    Comment: uses marijuana twice a day  . Sexual activity: Yes    Birth control/protection: Condom  Lifestyle  . Physical activity:    Days per week: Not on file    Minutes per session: Not on file  . Stress: Not  on file  Relationships  . Social connections:    Talks on phone: Not on file    Gets together: Not on file    Attends religious service: Not on file    Active member of club or organization: Not on file    Attends meetings of clubs or organizations: Not on file    Relationship status: Not on file  . Intimate partner violence:    Fear of current or ex partner: Not on file    Emotionally abused: Not on file    Physically abused: Not on file    Forced sexual activity: Not on file  Other Topics Concern  . Not on file  Social History Narrative  . Not on file    Family History  Problem Relation Age of Onset  . Hypertension Mother   . Diabetes Maternal Aunt   . Heart attack Neg Hx   . Stroke Neg Hx     ROS- All systems are reviewed and negative except as per the HPI above  Physical Exam: Vitals:   02/15/18 0901  BP: (!) 110/58  Pulse: 76  Weight: (!) 321 lb (145.6 kg)  Height: 5\' 7"  (1.702 m)   Wt Readings from Last 3 Encounters:  02/15/18 (!) 321 lb (145.6 kg)  02/10/18 (!) 320 lb (145.2 kg)  01/31/18 (!) 320 lb (145.2 kg)    Labs: Lab Results  Component Value Date   NA 140 02/10/2018   K 3.6 02/10/2018   CL 110 02/10/2018   CO2 22 02/10/2018   GLUCOSE 146 (H) 02/10/2018   BUN 20 02/10/2018   CREATININE 1.61 (H) 02/10/2018   CALCIUM 9.3 02/10/2018   MG 2.2 12/30/2017   Lab Results  Component Value Date   INR 1.04 05/16/2015   Lab Results  Component  Value Date   CHOL 142 03/31/2013   HDL 25.80 (L) 03/31/2013   LDLCALC 87 03/31/2013   TRIG 144.0 03/31/2013     GEN- The patient is well appearing, alert and oriented x 3 today.   Head- normocephalic, atraumatic Eyes-  Sclera clear, conjunctiva pink Ears- hearing intact Oropharynx- clear Neck- supple, no JVP Lymph- no cervical lymphadenopathy Lungs- Clear to ausculation bilaterally, normal work of breathing Heart- Regular rate and rhythm, no murmurs, rubs or gallops, PMI not laterally displaced GI- soft, NT, ND, + BS Extremities- no clubbing, cyanosis, or edema MS- no significant deformity or atrophy Skin- no rash or lesion Psych- euthymic mood, full affect Neuro- strength and sensation are intact  EKG-NSR at 76 bpm, ILBBB, Pr int 188 ms, qrs int 108 msl, qtc 501 ms   Assessment and Plan: 1. Paroxysmal afib  Now back in SR with increase of amiodarone  Continue 400 mg daily thru this week and then reduce back to 200 mg daily Continue eliquis 5 mg bid  2. HF- Weight is stable  Continue torsemide as prescribed  Low salt diet   3. ICD Per Dr. Caryl [name]  F/u with Dr. [name] and Dr. Caryl [name] as scheduled   Jeffrey [name], [facility] [address] [place], [place] [address] [phone]

## 2018-02-24 ENCOUNTER — Other Ambulatory Visit: Payer: Self-pay

## 2018-02-24 DIAGNOSIS — E785 Hyperlipidemia, unspecified: Secondary | ICD-10-CM

## 2018-02-24 MED ORDER — ATORVASTATIN CALCIUM 80 MG PO TABS: ORAL_TABLET | ORAL | 0 refills | Status: DC

## 2018-02-24 MED FILL — ALLOPURINOL 100 MG TABLET: 100 | 30 days supply | Qty: 60 | Fill #1

## 2018-02-24 MED FILL — ATORVASTATIN 80 MG TABLET: 80 | 30 days supply | Qty: 30 | Fill #0

## 2018-02-24 NOTE — Telephone Encounter (Signed)
Medication sent.

## 2018-03-07 MED FILL — FERROUS SULFATE 325 MG TAB: 325 (65 FE) | 30 days supply | Qty: 60 | Fill #1

## 2018-03-07 MED FILL — TORSEMIDE 20 MG TABLET: 20 | 30 days supply | Qty: 60 | Fill #1

## 2018-03-07 MED FILL — $ELIQUIS 5 MG TABLET: 5 | 30 days supply | Qty: 60 | Fill #3

## 2018-03-16 MED FILL — AMIODARONE HCL 200 MG TAB: 200 | 30 days supply | Qty: 30 | Fill #2

## 2018-03-16 MED FILL — GABAPENTIN 300 MG CAPSULE: 300 | 30 days supply | Qty: 90 | Fill #2

## 2018-03-16 MED FILL — ISOSORBIDE MN ER 30 MG TAB: 30 | 30 days supply | Qty: 30 | Fill #6

## 2018-03-31 MED FILL — ALLOPURINOL 100 MG TABLET: 100 | 30 days supply | Qty: 60 | Fill #2

## 2018-03-31 MED FILL — ATORVASTATIN 80 MG TABLET: 80 | 30 days supply | Qty: 30 | Fill #1

## 2018-04-05 ENCOUNTER — Encounter (HOSPITAL_COMMUNITY): Payer: Self-pay | Admitting: Internal Medicine

## 2018-04-11 ENCOUNTER — Other Ambulatory Visit: Payer: Self-pay | Admitting: Internal Medicine

## 2018-04-11 ENCOUNTER — Ambulatory Visit (INDEPENDENT_AMBULATORY_CARE_PROVIDER_SITE_OTHER): Payer: Medicaid Other | Admitting: *Deleted

## 2018-04-11 DIAGNOSIS — M109 Gout, unspecified: Secondary | ICD-10-CM

## 2018-04-11 DIAGNOSIS — I472 Ventricular tachycardia, unspecified: Secondary | ICD-10-CM

## 2018-04-11 DIAGNOSIS — I5022 Chronic systolic (congestive) heart failure: Secondary | ICD-10-CM

## 2018-04-11 MED FILL — TORSEMIDE 20 MG TABLET: 20 | 30 days supply | Qty: 60 | Fill #2

## 2018-04-11 MED FILL — AMIODARONE HCL 200 MG TAB: 200 | 30 days supply | Qty: 30 | Fill #3

## 2018-04-11 MED FILL — ELIQUIS 5 MG TABLET: 5 | 30 days supply | Qty: 60 | Fill #4

## 2018-04-11 NOTE — Progress Notes (Signed)
Remote ICD transmission.   

## 2018-04-25 MED FILL — FERROUS SULFATE 325 MG TAB: 325 (65 FE) | 30 days supply | Qty: 60 | Fill #2

## 2018-04-25 MED FILL — MITIGARE 0.6 MG CAPSULE: 0.6 | 30 days supply | Qty: 60 | Fill #0

## 2018-04-25 MED FILL — ISOSORBIDE MN ER 30 MG TAB: 30 | 30 days supply | Qty: 30 | Fill #7

## 2018-04-28 DIAGNOSIS — M109 Gout, unspecified: Secondary | ICD-10-CM | POA: Diagnosis not present

## 2018-04-28 DIAGNOSIS — N183 Chronic kidney disease, stage 3 (moderate): Secondary | ICD-10-CM | POA: Diagnosis not present

## 2018-04-28 DIAGNOSIS — D649 Anemia, unspecified: Secondary | ICD-10-CM | POA: Diagnosis not present

## 2018-04-28 DIAGNOSIS — E559 Vitamin D deficiency, unspecified: Secondary | ICD-10-CM | POA: Diagnosis not present

## 2018-05-02 ENCOUNTER — Ambulatory Visit: Payer: Self-pay | Admitting: Family Medicine

## 2018-05-05 MED FILL — ALLOPURINOL 100 MG TABLET: 100 | 30 days supply | Qty: 60 | Fill #3

## 2018-05-05 MED FILL — VIT D2 1.25 MG (50,000 UNIT: 1.25 MG | 28 days supply | Qty: 4 | Fill #1

## 2018-05-06 ENCOUNTER — Ambulatory Visit: Payer: Self-pay | Admitting: Family Medicine

## 2018-05-06 LAB — CUP PACEART REMOTE DEVICE CHECK
Date Time Interrogation Session: 20190708142700
Implantable Lead Location: 753862
Implantable Lead Model: 3401
MDC IDC LEAD IMPLANT DT: 20160812
MDC IDC MSMT BATTERY REMAINING PERCENTAGE: 67 %
MDC IDC PG IMPLANT DT: 20160812
Pulse Gen Serial Number: 117162

## 2018-05-09 MED FILL — GABAPENTIN 300 MG CAPSULE: 300 | 30 days supply | Qty: 90 | Fill #3

## 2018-05-10 ENCOUNTER — Encounter: Payer: Self-pay | Admitting: Family Medicine

## 2018-05-10 ENCOUNTER — Ambulatory Visit (INDEPENDENT_AMBULATORY_CARE_PROVIDER_SITE_OTHER): Payer: Medicaid Other | Admitting: Family Medicine

## 2018-05-10 VITALS — BP 108/72 | HR 80 | Temp 97.9°F | Ht 67.0 in | Wt 317.0 lb

## 2018-05-10 DIAGNOSIS — M25561 Pain in right knee: Secondary | ICD-10-CM | POA: Diagnosis not present

## 2018-05-10 DIAGNOSIS — G8929 Other chronic pain: Secondary | ICD-10-CM

## 2018-05-10 DIAGNOSIS — E1122 Type 2 diabetes mellitus with diabetic chronic kidney disease: Secondary | ICD-10-CM

## 2018-05-10 DIAGNOSIS — Z09 Encounter for follow-up examination after completed treatment for conditions other than malignant neoplasm: Secondary | ICD-10-CM | POA: Diagnosis not present

## 2018-05-10 DIAGNOSIS — E876 Hypokalemia: Secondary | ICD-10-CM

## 2018-05-10 DIAGNOSIS — M25562 Pain in left knee: Secondary | ICD-10-CM | POA: Diagnosis not present

## 2018-05-10 DIAGNOSIS — M545 Low back pain, unspecified: Secondary | ICD-10-CM

## 2018-05-10 DIAGNOSIS — R42 Dizziness and giddiness: Secondary | ICD-10-CM

## 2018-05-10 DIAGNOSIS — N183 Chronic kidney disease, stage 3 unspecified: Secondary | ICD-10-CM

## 2018-05-10 DIAGNOSIS — Z794 Long term (current) use of insulin: Secondary | ICD-10-CM

## 2018-05-10 DIAGNOSIS — R8290 Unspecified abnormal findings in urine: Secondary | ICD-10-CM

## 2018-05-10 LAB — POCT URINALYSIS DIP (MANUAL ENTRY)
Bilirubin, UA: NEGATIVE
Glucose, UA: NEGATIVE mg/dL
Ketones, POC UA: NEGATIVE mg/dL
Leukocytes, UA: NEGATIVE
Nitrite, UA: NEGATIVE
Spec Grav, UA: 1.01 (ref 1.010–1.025)
Urobilinogen, UA: 0.2 E.U./dL
pH, UA: 6 (ref 5.0–8.0)

## 2018-05-10 LAB — POCT GLYCOSYLATED HEMOGLOBIN (HGB A1C): Hemoglobin A1C: 6.3 % — AB (ref 4.0–5.6)

## 2018-05-10 MED ORDER — MECLIZINE HCL 25 MG PO TABS: 25.0000 mg | ORAL_TABLET | Freq: Three times a day (TID) | ORAL | 4 refills | Status: DC | PRN

## 2018-05-10 MED ORDER — TRAMADOL HCL 50 MG PO TABS
50.0000 mg | ORAL_TABLET | Freq: Four times a day (QID) | ORAL | 0 refills | Status: DC | PRN
Start: 2018-05-10 — End: 2018-07-25

## 2018-05-10 MED ORDER — POTASSIUM CHLORIDE ER 10 MEQ PO TBCR: 20.0000 meq | EXTENDED_RELEASE_TABLET | Freq: Every day | ORAL | 5 refills | Status: DC

## 2018-05-10 MED FILL — POTASSIUM CL 10 MEQ TAB SA: 10 | 15 days supply | Qty: 30 | Fill #0

## 2018-05-10 MED FILL — TORSEMIDE 20 MG TABLET: 20 | 30 days supply | Qty: 60 | Fill #0

## 2018-05-10 NOTE — Progress Notes (Signed)
Follow Up  Subjective:    Patient ID: Jeffrey [name], male    DOB: 10-21-1975, 42 y.o.   MRN: [id number]  Chief Complaint  Patient presents with  . Follow-up    6 month on diabetes  . Knee Pain  . Back Pain    HPI  Mr. Kloos has a past medical history of Ventricular Tachycardia, Diabetes, Renal Cell Carcinoma, Nephrolithiasis, Morbid Obesity, Hypertension, Hyperlipidemia, CKD, and Asthma. He is here today for follow up.   Current Status: Since his last office visit, he is doing well with no complaints. He denies fevers, chills, fatigue, recent infections, weight loss, and night sweats. He has not had any headaches, visual changes, dizziness, and falls. No chest pain, heart palpitations, cough and shortness of breath reported. No reports of GI problems such as nausea, vomiting, diarrhea, and constipation. He has no reports of blood in stools, dysuria and hematuria. No depression or anxiety, and denies suicidal ideations, homicidal ideations, or auditory hallucinations. He has increased pain in knees today.   Past Medical History:  Diagnosis Date  . Anxiety   . Aspirin allergy   . Childhood asthma   . Chronic systolic CHF (congestive heart failure) ([place])    a. EF 20-25% in 2012. b. EF 45-50% in 10/2011 with nonischemic nuc - presumed NICM. c. 12/2014 Echo: Sev depressed LV fxn, sev dil LV, mild LVH, mild MR, sev dil LA, mildly reduced RV fxn.  . CKD (chronic kidney disease) stage 2, GFR 60-89 ml/min   . High cholesterol   . Hypertension   . Morbid obesity ([place])   . Nephrolithiasis   . OSA on CPAP   . Presumed NICM    a. 04/2014 Myoview: EF 26%, glob HK, sev glob HK, ? prior infarct;  b. Never cathed 2/2 CKD.  [name] Renal cell carcinoma ([place])    a. s/p Rt robotic assisted partial converted to radical nephrectomy on 01/2013.  . Troponin level elevated    a. 04/2014, 12/2014: felt due to CHF.  . Type II diabetes mellitus ([place])   . Ventricular tachycardia ([place])    a. appropriate ICD therapy  12/2017   Family History  Problem Relation Age of Onset  . Hypertension Mother   . Diabetes Maternal Aunt   . Heart attack Neg Hx   . Stroke Neg Hx    Social History   Socioeconomic History  . Marital status: Significant Other    Spouse name: Not on file  . Number of children: Not on file  . Years of education: Not on file  . Highest education level: Not on file  Occupational History  . Not on file  Social Needs  . Financial resource strain: Not on file  . Food insecurity:    Worry: Not on file    Inability: Not on file  . Transportation needs:    Medical: Not on file    Non-medical: Not on file  Tobacco Use  . Smoking status: Former Smoker    Packs/day: 0.25    Years: 22.00    Pack years: 5.50    Last attempt to quit: 04/16/2015    Years since quitting: 3.0  . Smokeless tobacco: Never Used  Substance and Sexual Activity  . Alcohol use: No    Comment: occ   . Drug use: Yes    Frequency: 14.0 times per week    Types: Marijuana    Comment: uses marijuana twice a day  . Sexual activity: Yes  Birth control/protection: Condom  Lifestyle  . Physical activity:    Days per week: Not on file    Minutes per session: Not on file  . Stress: Not on file  Relationships  . Social connections:    Talks on phone: Not on file    Gets together: Not on file    Attends religious service: Not on file    Active member of club or organization: Not on file    Attends meetings of clubs or organizations: Not on file    Relationship status: Not on file  . Intimate partner violence:    Fear of current or ex partner: Not on file    Emotionally abused: Not on file    Physically abused: Not on file    Forced sexual activity: Not on file  Other Topics Concern  . Not on file  Social History Narrative  . Not on file    Past Surgical History:  Procedure Laterality Date  . APPENDECTOMY  07/2004  . CARDIAC CATHETERIZATION N/A 05/17/2015   Procedure: Right/Left Heart Cath and Coronary  Angiography;  Surgeon: Jolaine Artist, MD;  Location: [facility];  Service: Cardiovascular;  Laterality: N/A;  . EP IMPLANTABLE DEVICE N/A 05/17/2015   Procedure: SubQ ICD Implant;  Surgeon: Will Meredith Leeds, MD;  Location: [facility];  Service: Cardiovascular;  Laterality: N/A;  . ROBOTIC ASSISTED LAPAROSCOPIC LYSIS OF ADHESION  01/13/2013   Procedure: ROBOTIC ASSISTED LAPAROSCOPIC LYSIS OF ADHESION EXTENSIVE;  Surgeon: Alexis Frock, MD;  Location: [facility];  Service: Urology;;  . ROBOTIC ASSITED PARTIAL NEPHRECTOMY Right 01/13/2013   Procedure: ROBOTIC ASSITED PARTIAL NEPHRECTOMY CONVERTED TO ROBOTIC ASSISTED RIGHT RADICAL NEPHRECTOMY;  Surgeon: Alexis Frock, MD;  Location: [facility];  Service: Urology;  Laterality: Right;    Immunization History  Administered Date(s) Administered  . Influenza Split 10/24/2011  . Influenza,inj,Quad PF,6+ Mos 07/23/2017  . Pneumococcal Conjugate-13 11/01/2017  . Pneumococcal Polysaccharide-23 10/24/2011  . Tdap 04/07/2015    Current Meds  Medication Sig  . allopurinol (ZYLOPRIM) 100 MG tablet Take 1 tablet (100 mg total) by mouth daily.  [name] amiodarone (PACERONE) 200 MG tablet Take 200 mg by mouth daily.  [name] apixaban (ELIQUIS) 5 MG TABS tablet Take 1 tablet (5 mg total) by mouth 2 (two) times daily.  [name] atorvastatin (LIPITOR) 80 MG tablet TAKE 1 TABLET (80 MG TOTAL) BY MOUTH EVERY EVENING.  . colchicine 0.6 MG tablet TAKE 1 TABLET BY MOUTH 2 TIMES DAILY  . gabapentin (NEURONTIN) 300 MG capsule Take 1 capsule (300 mg total) by mouth 3 (three) times daily.  . insulin NPH-regular Human (NOVOLIN 70/30) (70-30) 100 UNIT/ML injection Inject 34 Units into the skin 2 (two) times daily with a meal.  . isosorbide mononitrate (IMDUR) 30 MG 24 hr tablet Take 1 tablet (30 mg total) by mouth daily.  . Magnesium Oxide 200 MG TABS Take 1 tablet (200 mg total) by mouth daily. (Patient taking differently: Take 100 mg by mouth daily. )  .  sacubitril-valsartan (ENTRESTO) 97-103 MG Take 1 tablet by mouth 2 (two) times daily.  [name] torsemide (DEMADEX) 20 MG tablet Take 2 tablets (40 mg total) by mouth daily.    Allergies  Allergen Reactions  . Aspirin Shortness Of Breath, Itching and Rash     Burning sensation (Patient reports he tolerates other NSAIDS)   . Bee Venom Hives and Swelling  . Lisinopril Cough  . Tomato Rash    BP 108/72 (BP Location: Left  Arm, Patient Position: Sitting, Cuff Size: Large)   Pulse 80   Temp 97.9 F (36.6 C) (Oral)   Ht 5\' 7"  (1.702 m)   Wt (!) 317 lb (143.8 kg)   SpO2 100%   BMI 49.65 kg/m   Review of Systems  Constitutional: Negative.   HENT: Negative.   Eyes: Negative.   Respiratory: Negative.   Cardiovascular: Negative.   Gastrointestinal: Positive for abdominal distention (Obese).  Endocrine: Negative.   Genitourinary: Negative.   Musculoskeletal: Positive for joint swelling (Bilateral chronic knee pain. ).  Allergic/Immunologic: Negative.   Neurological: Positive for dizziness.  Hematological: Negative.   Psychiatric/Behavioral: Negative.    Objective:   Physical Exam  Constitutional: He is oriented to person, place, and time. He appears well-developed and well-nourished.  HENT:  Head: Normocephalic and atraumatic.  Right Ear: External ear normal.  Left Ear: External ear normal.  Nose: Nose normal.  Mouth/Throat: Oropharynx is clear and moist.  Eyes: Pupils are equal, round, and reactive to light. Conjunctivae and EOM are normal.  Neck: Normal range of motion. Neck supple.  Cardiovascular: Normal rate, regular rhythm, normal heart sounds and intact distal pulses.  Pulmonary/Chest: Effort normal and breath sounds normal.  Abdominal: Soft. Bowel sounds are normal. He exhibits distension (Obese).  Musculoskeletal: Normal range of motion.  Neurological: He is alert and oriented to person, place, and time.  Skin: Skin is warm and dry. Capillary refill takes less than 2  seconds.  Psychiatric: He has a normal mood and affect. His behavior is normal. Thought content normal.  Nursing note and vitals reviewed.  Assessment & Plan:   1. Type 2 diabetes mellitus with stage 3 chronic kidney disease, with long-term current use of insulin (HCC) Hgb A1c is decreased at 6.3 today;, from 6.6 on 11/01/2017. He will continue to decrease foods/beverages high in sugars and carbs and follow Heart Healthy or DASH diet. Increase physical activity to at least 30 minutes cardio exercise daily.   - POCT glycosylated hemoglobin (Hb A1C) - POCT urinalysis dipstick  2. Dizziness Mild, occasionally. We will refill Meclizine today.  - meclizine (ANTIVERT) 25 MG tablet; Take 1 tablet (25 mg total) by mouth 3 (three) times daily as needed for dizziness (Take meclizine for spinning dizziness. You may also take Valium if your symptoms are not relieved by the meclizine alone.).  Dispense: 30 tablet; Refill: 4  3. Hypokalemia Potassium at normal levels at 3.6 on 02/10/2018. He continues Torsemide.  - potassium chloride (K-DUR) 10 MEQ tablet; Take 2 tablets (20 mEq total) by mouth daily.  Dispense: 30 tablet; Refill: 5  4. Chronic pain of both knees - traMADol (ULTRAM) 50 MG tablet; Take 1 tablet (50 mg total) by mouth every 6 (six) hours as needed.  Dispense: 20 tablet; Refill: 0  5. Low back pain without sciatica, unspecified back pain laterality, unspecified chronicity We will refill back pain and leg pain.  - traMADol (ULTRAM) 50 MG tablet; Take 1 tablet (50 mg total) by mouth every 6 (six) hours as needed.  Dispense: 20 tablet; Refill: 0  6. Abnormal urinalysis - Urine Culture  7. Follow up He will follow up in 2 weeks.   Meds ordered this encounter  Medications  . meclizine (ANTIVERT) 25 MG tablet    Sig: Take 1 tablet (25 mg total) by mouth 3 (three) times daily as needed for dizziness (Take meclizine for spinning dizziness. You may also take Valium if your symptoms are not  relieved by the meclizine alone.).  Dispense:  30 tablet    Refill:  4  . potassium chloride (K-DUR) 10 MEQ tablet    Sig: Take 2 tablets (20 mEq total) by mouth daily.    Dispense:  30 tablet    Refill:  5    Disregard previous order for 10 mEq  . traMADol (ULTRAM) 50 MG tablet    Sig: Take 1 tablet (50 mg total) by mouth every 6 (six) hours as needed.    Dispense:  20 tablet    Refill:  0    Order Specific Question:   Supervising Provider    Answer:   Tresa Garter [[id number]]    Kathe Becton,  MSN, FNP-C [facility] [address] [place], [place] [address] [phone]

## 2018-05-10 NOTE — Patient Instructions (Signed)
Heart-Healthy Eating Plan Heart-healthy meal planning includes:  Limiting unhealthy fats.  Increasing healthy fats.  Making other small dietary changes.  You may need to talk with your doctor or a diet specialist (dietitian) to create an eating plan that is right for you. What types of fat should I choose?  Choose healthy fats. These include olive oil and canola oil, flaxseeds, walnuts, almonds, and seeds.  Eat more omega-3 fats. These include salmon, mackerel, sardines, tuna, flaxseed oil, and ground flaxseeds. Try to eat fish at least twice each week.  Limit saturated fats. ? Saturated fats are often found in animal products, such as meats, butter, and cream. ? Plant sources of saturated fats include palm oil, palm kernel oil, and coconut oil.  Avoid foods with partially hydrogenated oils in them. These include stick margarine, some tub margarines, cookies, crackers, and other baked goods. These contain trans fats. What general guidelines do I need to follow?  Check food labels carefully. Identify foods with trans fats or high amounts of saturated fat.  Fill one half of your plate with vegetables and green salads. Eat 4-5 servings of vegetables per day. A serving of vegetables is: ? 1 cup of raw leafy vegetables. ?  cup of raw or cooked cut-up vegetables. ?  cup of vegetable juice.  Fill one fourth of your plate with whole grains. Look for the word "whole" as the first word in the ingredient list.  Fill one fourth of your plate with lean protein foods.  Eat 4-5 servings of fruit per day. A serving of fruit is: ? One medium whole fruit. ?  cup of dried fruit. ?  cup of fresh, frozen, or canned fruit. ?  cup of 100% fruit juice.  Eat more foods that contain soluble fiber. These include apples, broccoli, carrots, beans, peas, and barley. Try to get 20-30 g of fiber per day.  Eat more home-cooked food. Eat less restaurant, buffet, and fast food.  Limit or avoid  alcohol.  Limit foods high in starch and sugar.  Avoid fried foods.  Avoid frying your food. Try baking, boiling, grilling, or broiling it instead. You can also reduce fat by: ? Removing the skin from poultry. ? Removing all visible fats from meats. ? Skimming the fat off of stews, soups, and gravies before serving them. ? Steaming vegetables in water or broth.  Lose weight if you are overweight.  Eat 4-5 servings of nuts, legumes, and seeds per week: ? One serving of dried beans or legumes equals  cup after being cooked. ? One serving of nuts equals 1 ounces. ? One serving of seeds equals  ounce or one tablespoon.  You may need to keep track of how much salt or sodium you eat. This is especially true if you have high blood pressure. Talk with your doctor or dietitian to get more information. What foods can I eat? Grains Breads, including French, white, pita, wheat, raisin, rye, oatmeal, and Italian. Tortillas that are neither fried nor made with lard or trans fat. Low-fat rolls, including hotdog and hamburger buns and English muffins. Biscuits. Muffins. Waffles. Pancakes. Light popcorn. Whole-grain cereals. Flatbread. Melba toast. Pretzels. Breadsticks. Rusks. Low-fat snacks. Low-fat crackers, including oyster, saltine, matzo, graham, animal, and rye. Rice and pasta, including brown rice and pastas that are made with whole wheat. Vegetables All vegetables. Fruits All fruits, but limit coconut. Meats and Other Protein Sources Lean, well-trimmed beef, veal, pork, and lamb. Chicken and turkey without skin. All fish and shellfish.   Wild duck, rabbit, pheasant, and venison. Egg whites or low-cholesterol egg substitutes. Dried beans, peas, lentils, and tofu. Seeds and most nuts. Dairy Low-fat or nonfat cheeses, including ricotta, string, and mozzarella. Skim or 1% milk that is liquid, powdered, or evaporated. Buttermilk that is made with low-fat milk. Nonfat or low-fat  yogurt. Beverages Mineral water. Diet carbonated beverages. Sweets and Desserts Sherbets and fruit ices. Honey, jam, marmalade, jelly, and syrups. Meringues and gelatins. Pure sugar candy, such as hard candy, jelly beans, gumdrops, mints, marshmallows, and small amounts of dark chocolate. Angel food cake. Eat all sweets and desserts in moderation. Fats and Oils Nonhydrogenated (trans-free) margarines. Vegetable oils, including soybean, sesame, sunflower, olive, peanut, safflower, corn, canola, and cottonseed. Salad dressings or mayonnaise made with a vegetable oil. Limit added fats and oils that you use for cooking, baking, salads, and as spreads. Other Cocoa powder. Coffee and tea. All seasonings and condiments. The items listed above may not be a complete list of recommended foods or beverages. Contact your dietitian for more options. What foods are not recommended? Grains Breads that are made with saturated or trans fats, oils, or whole milk. Croissants. Butter rolls. Cheese breads. Sweet rolls. Donuts. Buttered popcorn. Chow mein noodles. High-fat crackers, such as cheese or butter crackers. Meats and Other Protein Sources Fatty meats, such as hotdogs, short ribs, sausage, spareribs, bacon, rib eye roast or steak, and mutton. High-fat deli meats, such as salami and bologna. Caviar. Domestic duck and goose. Organ meats, such as kidney, liver, sweetbreads, and heart. Dairy Cream, sour cream, cream cheese, and creamed cottage cheese. Whole-milk cheeses, including blue (bleu), Monterey Jack, Brie, [place], American, Havarti, Swiss, cheddar, Camembert, and [place]. Whole or 2% milk that is liquid, evaporated, or condensed. Whole buttermilk. Cream sauce or high-fat cheese sauce. Yogurt that is made from whole milk. Beverages Regular sodas and juice drinks with added sugar. Sweets and Desserts Frosting. Pudding. Cookies. Cakes other than angel food cake. Candy that has milk chocolate or white  chocolate, hydrogenated fat, butter, coconut, or unknown ingredients. Buttered syrups. Full-fat ice cream or ice cream drinks. Fats and Oils Gravy that has suet, meat fat, or shortening. Cocoa butter, hydrogenated oils, palm oil, coconut oil, palm kernel oil. These can often be found in baked products, candy, fried foods, nondairy creamers, and whipped toppings. Solid fats and shortenings, including bacon fat, salt pork, lard, and butter. Nondairy cream substitutes, such as coffee creamers and sour cream substitutes. Salad dressings that are made of unknown oils, cheese, or sour cream. The items listed above may not be a complete list of foods and beverages to avoid. Contact your dietitian for more information. This information is not intended to replace advice given to you by your health care provider. Make sure you discuss any questions you have with your health care provider. Document Released: 03/22/2012 Document Revised: 02/27/2016 Document Reviewed: 03/15/2014 [employer] Interactive Patient Education  2018 [employer]  

## 2018-05-11 MED FILL — traMADol HCL 50 MG TABS: 50 | 5 days supply | Qty: 20 | Fill #0

## 2018-05-11 MED FILL — MECLIZINE 25 MG TABLET: 25 | 10 days supply | Qty: 30 | Fill #0

## 2018-05-12 LAB — URINE CULTURE

## 2018-05-13 ENCOUNTER — Ambulatory Visit: Payer: Self-pay | Admitting: Family Medicine

## 2018-05-26 ENCOUNTER — Ambulatory Visit (HOSPITAL_COMMUNITY)
Admission: RE | Admit: 2018-05-26 | Discharge: 2018-05-26 | Disposition: A | Discharge status: Home / Self Care | Payer: Medicaid Other | Source: Ambulatory Visit | Attending: Internal Medicine | Admitting: Internal Medicine

## 2018-05-26 VITALS — BP 130/72 | HR 74 | Wt 315.2 lb

## 2018-05-26 DIAGNOSIS — I130 Hypertensive heart and chronic kidney disease with heart failure and stage 1 through stage 4 chronic kidney disease, or unspecified chronic kidney disease: Secondary | ICD-10-CM | POA: Diagnosis not present

## 2018-05-26 DIAGNOSIS — E1122 Type 2 diabetes mellitus with diabetic chronic kidney disease: Secondary | ICD-10-CM | POA: Diagnosis not present

## 2018-05-26 DIAGNOSIS — I428 Other cardiomyopathies: Secondary | ICD-10-CM | POA: Insufficient documentation

## 2018-05-26 DIAGNOSIS — Z95810 Presence of automatic (implantable) cardiac defibrillator: Secondary | ICD-10-CM | POA: Insufficient documentation

## 2018-05-26 DIAGNOSIS — N183 Chronic kidney disease, stage 3 unspecified: Secondary | ICD-10-CM

## 2018-05-26 DIAGNOSIS — Z886 Allergy status to analgesic agent status: Secondary | ICD-10-CM | POA: Diagnosis not present

## 2018-05-26 DIAGNOSIS — I472 Ventricular tachycardia, unspecified: Secondary | ICD-10-CM

## 2018-05-26 DIAGNOSIS — Z87891 Personal history of nicotine dependence: Secondary | ICD-10-CM | POA: Insufficient documentation

## 2018-05-26 DIAGNOSIS — Z7901 Long term (current) use of anticoagulants: Secondary | ICD-10-CM | POA: Diagnosis not present

## 2018-05-26 DIAGNOSIS — I480 Paroxysmal atrial fibrillation: Secondary | ICD-10-CM | POA: Diagnosis not present

## 2018-05-26 DIAGNOSIS — Z888 Allergy status to other drugs, medicaments and biological substances status: Secondary | ICD-10-CM | POA: Diagnosis not present

## 2018-05-26 DIAGNOSIS — Z6841 Body Mass Index (BMI) 40.0 and over, adult: Secondary | ICD-10-CM | POA: Diagnosis not present

## 2018-05-26 DIAGNOSIS — Z905 Acquired absence of kidney: Secondary | ICD-10-CM | POA: Insufficient documentation

## 2018-05-26 DIAGNOSIS — Z79899 Other long term (current) drug therapy: Secondary | ICD-10-CM | POA: Diagnosis not present

## 2018-05-26 DIAGNOSIS — Z85528 Personal history of other malignant neoplasm of kidney: Secondary | ICD-10-CM | POA: Insufficient documentation

## 2018-05-26 DIAGNOSIS — I10 Essential (primary) hypertension: Secondary | ICD-10-CM | POA: Diagnosis not present

## 2018-05-26 DIAGNOSIS — M109 Gout, unspecified: Secondary | ICD-10-CM | POA: Insufficient documentation

## 2018-05-26 DIAGNOSIS — I5022 Chronic systolic (congestive) heart failure: Secondary | ICD-10-CM

## 2018-05-26 DIAGNOSIS — E7800 Pure hypercholesterolemia, unspecified: Secondary | ICD-10-CM | POA: Diagnosis not present

## 2018-05-26 DIAGNOSIS — G4733 Obstructive sleep apnea (adult) (pediatric): Secondary | ICD-10-CM | POA: Diagnosis not present

## 2018-05-26 DIAGNOSIS — Z794 Long term (current) use of insulin: Secondary | ICD-10-CM | POA: Insufficient documentation

## 2018-05-26 DIAGNOSIS — Z91030 Bee allergy status: Secondary | ICD-10-CM | POA: Insufficient documentation

## 2018-05-26 DIAGNOSIS — Z91018 Allergy to other foods: Secondary | ICD-10-CM | POA: Diagnosis not present

## 2018-05-26 DIAGNOSIS — Z9049 Acquired absence of other specified parts of digestive tract: Secondary | ICD-10-CM | POA: Insufficient documentation

## 2018-05-26 DIAGNOSIS — Z87442 Personal history of urinary calculi: Secondary | ICD-10-CM | POA: Diagnosis not present

## 2018-05-26 LAB — BASIC METABOLIC PANEL
ANION GAP: 8 (ref 5–15)
BUN: 20 mg/dL (ref 6–20)
CHLORIDE: 107 mmol/L (ref 98–111)
CO2: 23 mmol/L (ref 22–32)
CREATININE: 1.52 mg/dL — AB (ref 0.61–1.24)
Calcium: 9.5 mg/dL (ref 8.9–10.3)
GFR calc non Af Amer: 55 mL/min — ABNORMAL LOW (ref >60)
Glucose, Bld: 131 mg/dL — ABNORMAL HIGH (ref 70–99)
Potassium: 4.2 mmol/L (ref 3.5–5.1)
Sodium: 138 mmol/L (ref 135–145)

## 2018-05-26 LAB — BRAIN NATRIURETIC PEPTIDE: B Natriuretic Peptide: 93.3 pg/mL (ref 0.0–100.0)

## 2018-05-26 MED ORDER — CARVEDILOL 3.125 MG PO TABS: 3.1250 mg | ORAL_TABLET | Freq: Two times a day (BID) | ORAL | 3 refills | Status: DC

## 2018-05-26 MED FILL — POTASSIUM CL 10 MEQ TAB SA: 10 | 30 days supply | Qty: 60 | Fill #2

## 2018-05-26 MED FILL — ELIQUIS 5 MG TABLET: 5 | 30 days supply | Qty: 60 | Fill #5

## 2018-05-26 MED FILL — ATORVASTATIN 80 MG TABLET: 80 | 30 days supply | Qty: 30 | Fill #2

## 2018-05-26 MED FILL — CARVEDILOL 3.125 MG TABLET: 3.125 | 30 days supply | Qty: 60 | Fill #0

## 2018-05-26 NOTE — Progress Notes (Signed)
Patient ID: Jeffrey [name], male   DOB: [birth year], 42 y.o.   MRN: [id number]      Advanced Heart Failure Clinic Note   Patient Name: Jeffrey [name] Date of Encounter: 05/26/2018  Primary Care Provider:  [name], FNP Primary Cardiologist:  Lizbeth Bark, MD  Primary HF Cardiologist: Bensimhon  History:  Jeffrey [name] is a 42 y.o. male with a history of  poorly controlled hypertension, R renal cell carcinoma s/p nephrectomy, DM2, OSA, gout, morbid obesity and systolic HF due tononischemic myopathy.    Admitted to [facility] in 3/16 secondary to atypical chest pain, dyspnea, and mild troponin elevation. Echo during hospitalization shows "severely reduced LV function" - EF not quantified due to poor windows. He was diuresed and was down about 7 pounds at discharge, at 289 on the hospital scale.   Admitted 05/14/15 with CP at rest. CXR looked improved from previous. CP resolved spontaneously after hospitalization and troponin was flat. R/LHC showed normal coronaries and moderately elevated filling pressures with moderately depressed cardiac output. [place] subcutaneous ICD was implanted 05/17/15.    Admitted 9/4 - 0/1/09 with A/C systolic CHF in the setting of new afib. Started on IV lasix and amiodarone. Pt diuresed and converted to NSR without requiring DCCV. Started on Eliquis for [facility]. Continued on po amio on discharge with wean over several weeks.  Admitted 3/27 - 01/01/18 with ICD shock. Electrolytes stable. Low grade fever and volume overload noted. Diuresed 7 lbs and treated with course of ABX. Echo 12/31/17 with LVEF 25-30%.   He presents today for follow up. Feeling good. Jogging 3 miles a day and doing 100 situps and 50 pushups. About 2 weeks ago was doing pushups and popped something in his neck having pain in right shoulder. Denies SOB, CP or palpitations. No edema. Taking torsemide 40 daily. Has not needed extra. Not using CPAP. Now on Medicaid so wants to get another machine.    Echo 12/31/17 LVEF 25-30%, Moderate MR, mild RV dilation, Severe RAE, Mod TR, Pa peak pressure 67 mm Hg.   ECHO 01/16/2016 EF 20-25%  Grade III DD Echo 05/07/15.  EF 20-25%, grade II diastolic dysfunction, diffuse hypokinesis, moderate LV dilation, mild LVH, normal RV size with mildly decreased systolic function.   Echo in 2012 with EF 20-25%.  In 1/13 had nuclear study EF 45-50% suggestive of NICM.   [facility] 05/17/15 RA = 7 RV = 42/3/7 PA = 43/30 (39) PCW = 26 Fick cardiac output/index = 4.9/2.1 PVR = 2.7 WU SVR = 1651 FA sat = 95% PA sat = 65%, 65% Ao = 122/97 (109) LV = 110/31/33 1) Normal coronaries 2) Moderately elevated filling pressures with moderately depressed cardiac output  Review of systems complete and found to be negative unless listed in HPI.     Past Medical History:  Diagnosis Date  . Anxiety   . Aspirin allergy   . Childhood asthma   . Chronic systolic CHF (congestive heart failure) ([place])    a. EF 20-25% in 2012. b. EF 45-50% in 10/2011 with nonischemic nuc - presumed NICM. c. 12/2014 Echo: Sev depressed LV fxn, sev dil LV, mild LVH, mild MR, sev dil LA, mildly reduced RV fxn.  . CKD (chronic kidney disease) stage 2, GFR 60-89 ml/min   . High cholesterol   . Hypertension   . Morbid obesity ([place])   . Nephrolithiasis   . OSA on CPAP   . Presumed NICM    a. 04/2014  Myoview: EF 26%, glob HK, sev glob HK, ? prior infarct;  b. Never cathed 2/2 CKD.  [name] Renal cell carcinoma ([place])    a. s/p Rt robotic assisted partial converted to radical nephrectomy on 01/2013.  . Troponin level elevated    a. 04/2014, 12/2014: felt due to CHF.  . Type II diabetes mellitus ([place])   . Ventricular tachycardia ([place])    a. appropriate ICD therapy 12/2017   Past Surgical History:  Procedure Laterality Date  . APPENDECTOMY  07/2004  . CARDIAC CATHETERIZATION N/A 05/17/2015   Procedure: Right/Left Heart Cath and Coronary Angiography;  Surgeon: Jolaine Artist, MD;  Location: [place] [facility]  CV LAB;  Service: Cardiovascular;  Laterality: N/A;  . EP IMPLANTABLE DEVICE N/A 05/17/2015   Procedure: SubQ ICD Implant;  Surgeon: Will Meredith Leeds, MD;  Location: [place] CV LAB;  Service: Cardiovascular;  Laterality: N/A;  . ROBOTIC ASSISTED LAPAROSCOPIC LYSIS OF ADHESION  01/13/2013   Procedure: ROBOTIC ASSISTED LAPAROSCOPIC LYSIS OF ADHESION EXTENSIVE;  Surgeon: Alexis Frock, MD;  Location: [facility] ORS;  Service: Urology;;  . ROBOTIC ASSITED PARTIAL NEPHRECTOMY Right 01/13/2013   Procedure: ROBOTIC ASSITED PARTIAL NEPHRECTOMY CONVERTED TO ROBOTIC ASSISTED RIGHT RADICAL NEPHRECTOMY;  Surgeon: Alexis Frock, MD;  Location: [facility] ORS;  Service: Urology;  Laterality: Right;    Allergies  Allergies  Allergen Reactions  . Aspirin Shortness Of Breath, Itching and Rash     Burning sensation (Patient reports he tolerates other NSAIDS)   . Bee Venom Hives and Swelling  . Lisinopril Cough  . Tomato Rash     Home Medications Current Outpatient Medications  Medication Sig Dispense Refill  . allopurinol (ZYLOPRIM) 100 MG tablet Take 1 tablet (100 mg total) by mouth daily. 90 tablet 3  . amiodarone (PACERONE) 200 MG tablet Take 200 mg by mouth daily.    [name] apixaban (ELIQUIS) 5 MG TABS tablet Take 1 tablet (5 mg total) by mouth 2 (two) times daily. 60 tablet 10  . atorvastatin (LIPITOR) 80 MG tablet TAKE 1 TABLET (80 MG TOTAL) BY MOUTH EVERY EVENING. 90 tablet 0  . colchicine 0.6 MG tablet TAKE 1 TABLET BY MOUTH 2 TIMES DAILY 180 tablet 3  . gabapentin (NEURONTIN) 300 MG capsule Take 1 capsule (300 mg total) by mouth 3 (three) times daily. 90 capsule 3  . insulin NPH-regular Human (NOVOLIN 70/30) (70-30) 100 UNIT/ML injection Inject 34 Units into the skin 2 (two) times daily with a meal. 60 mL 3  . isosorbide mononitrate (IMDUR) 30 MG 24 hr tablet Take 1 tablet (30 mg total) by mouth daily. 90 tablet 3  . Magnesium Oxide 200 MG TABS Take 1 tablet (200 mg total) by mouth daily. (Patient taking  differently: Take 100 mg by mouth daily. ) 90 tablet 2  . meclizine (ANTIVERT) 25 MG tablet Take 1 tablet (25 mg total) by mouth 3 (three) times daily as needed for dizziness (Take meclizine for spinning dizziness. You may also take Valium if your symptoms are not relieved by the meclizine alone.). 30 tablet 4  . potassium chloride (K-DUR) 10 MEQ tablet Take 2 tablets (20 mEq total) by mouth daily. 30 tablet 5  . sacubitril-valsartan (ENTRESTO) 97-103 MG Take 1 tablet by mouth 2 (two) times daily. 180 tablet 3  . torsemide (DEMADEX) 20 MG tablet Take 2 tablets (40 mg total) by mouth daily. 60 tablet 3  . traMADol (ULTRAM) 50 MG tablet Take 1 tablet (50 mg total) by mouth every 6 (six) hours as  needed. 20 tablet 0   No current facility-administered medications for this encounter.    Vitals:   05/26/18 1125  BP: 130/72  Pulse: 74  SpO2: 96%  Weight: (!) 143 kg (315 lb 3.2 oz)   Wt Readings from Last 3 Encounters:  05/26/18 (!) 143 kg (315 lb 3.2 oz)  05/10/18 (!) 143.8 kg (317 lb)  02/15/18 (!) 145.6 kg (321 lb)   Physical Exam General:  Well appearing. No resp difficulty HEENT: normal Neck: supple. no JVD. Carotids 2+ bilat; no bruits. No lymphadenopathy or thryomegaly appreciated. Cor: PMI nondisplaced. Regular rate & rhythm. No rubs, gallops or murmurs. Lungs: clear Abdomen: soft, nontender, nondistended. No hepatosplenomegaly. No bruits or masses. Good bowel sounds. Extremities: no cyanosis, clubbing, rash, edema Neuro: alert & orientedx3, cranial nerves grossly intact. moves all 4 extremities w/o difficulty. Affect pleasant   Assessment & Plan 1.  Chronic systolic congestive heart failure: s/p [employer] subcutaneous ICD implant 05/17/15.  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  - Echo 12/31/17 LVEF 25-30%, Moderate MR, mild RV dilation, Severe RAE, Mod TR, Pa peak pressure 67 mm Hg.  - Overall improved. Now NYHA I-II - Volume status ok  - Continue torsemide 40 mg daily   - Continue Entresto 97/103 mg BID. BMET today. Watch closely with solitary kidney -  Has been off hydralazine with recent AKI and tenuous BP. Now BP better. Will consider restarting soon - Try carvedilol 3.125 bid - Continue Imdur 30 mg daily.  - Off spiro with recent AKI and tenuous BP.  - Reinforced fluid restriction to < 2 L daily, sodium restriction to less than 2000 mg daily, and the importance of daily weights.   - Refer to [facility] to titrate meds q 3weeks and try to get back on hydral and spiro  2. Paroxysmal Afib - New diagnosis 06/2017.  - No bleeding on Eliquis.  - Continue amiodarone 200 daily - Will need yearly eye exams. Need to watch TFTs closely  3. VT with ICD shock 12/29/17 - Continue amiodarone 200 mg daily.  - No driving until 7/68/08. Pt aware.  4. Hypertension:  - Meds as above.  5. CKD stage III - BMET today. Creatinine 1.48 08/22/17 and 1.61 on 02/10/18 6. OSA:  - He has a defunct CPAP machine with confirmed diagnosis years ago and poor follow up.  - He will need a repeat sleeps study in order to get a new CPAP machine. Will place referral  7. Tobacco use: - Former smoker. No change.  8. Gout:  - Continue allopurinol 200 daily for prophylaxis. No change.  9. DM2:  - Stable. Per PCP.  10.Renal Cell Carcinoma - S/P nephrectomy 2014.  - BMET today. Follow closely in setting of solitary kidney.  11. Morbid Obesity - Body mass index is 49.37 kg/m.  - Encouraged weight loss.    Glori Bickers, MD  12:02 PM

## 2018-05-26 NOTE — Patient Instructions (Signed)
Start Carvedilol 3.125 mg Twice daily   Labs today  Your physician has recommended that you have a sleep study. This test records several body functions during sleep, including: brain activity, eye movement, oxygen and carbon dioxide blood levels, heart rate and rhythm, breathing rate and rhythm, the flow of air through your mouth and nose, snoring, body muscle movements, and chest and belly movement.  Please follow up with Doroteo Bradford, our heart failure pharmacist every 3 weeks for 3 visits  Your physician recommends that you schedule a follow-up appointment in: 4 months

## 2018-05-30 MED FILL — HUMULIN 70/30 VIAL: (70-30) 100 | 29 days supply | Qty: 20 | Fill #1

## 2018-06-09 ENCOUNTER — Encounter (HOSPITAL_COMMUNITY): Payer: Self-pay | Admitting: *Deleted

## 2018-06-09 NOTE — Progress Notes (Signed)
Received medical records request from [facility] , CASE # [id number]  Requested records faxed today to:  [phone].  Original request will be scanned to patient's electronic medical record.

## 2018-06-13 MED FILL — ISOSORBIDE MN ER 30 MG TAB: 30 | 90 days supply | Qty: 90 | Fill #8

## 2018-06-16 ENCOUNTER — Inpatient Hospital Stay (HOSPITAL_COMMUNITY): Admission: RE | Admit: 2018-06-16 | Payer: Medicaid Other | Source: Ambulatory Visit

## 2018-06-20 MED FILL — MECLIZINE 25 MG TABLET: 25 | 10 days supply | Qty: 30 | Fill #1

## 2018-06-20 MED FILL — TORSEMIDE 20 MG TABLET: 20 | 30 days supply | Qty: 60 | Fill #1

## 2018-06-20 MED FILL — AMIODARONE HCL 200 MG TAB: 200 | 30 days supply | Qty: 30 | Fill #4

## 2018-06-20 MED FILL — ALLOPURINOL 100 MG TABLET: 100 | 30 days supply | Qty: 60 | Fill #4

## 2018-06-21 ENCOUNTER — Other Ambulatory Visit (HOSPITAL_COMMUNITY): Payer: Medicaid Other

## 2018-06-22 ENCOUNTER — Ambulatory Visit (HOSPITAL_COMMUNITY)
Admission: RE | Admit: 2018-06-22 | Discharge: 2018-06-22 | Disposition: A | Discharge status: Home / Self Care | Payer: Medicaid Other | Source: Ambulatory Visit | Attending: Internal Medicine | Admitting: Internal Medicine

## 2018-06-22 DIAGNOSIS — I480 Paroxysmal atrial fibrillation: Secondary | ICD-10-CM | POA: Insufficient documentation

## 2018-06-22 DIAGNOSIS — Z905 Acquired absence of kidney: Secondary | ICD-10-CM | POA: Diagnosis not present

## 2018-06-22 DIAGNOSIS — I472 Ventricular tachycardia: Secondary | ICD-10-CM | POA: Diagnosis not present

## 2018-06-22 DIAGNOSIS — I5022 Chronic systolic (congestive) heart failure: Secondary | ICD-10-CM | POA: Insufficient documentation

## 2018-06-22 DIAGNOSIS — Z6841 Body Mass Index (BMI) 40.0 and over, adult: Secondary | ICD-10-CM | POA: Diagnosis not present

## 2018-06-22 DIAGNOSIS — Z85528 Personal history of other malignant neoplasm of kidney: Secondary | ICD-10-CM | POA: Diagnosis not present

## 2018-06-22 DIAGNOSIS — I428 Other cardiomyopathies: Secondary | ICD-10-CM | POA: Diagnosis not present

## 2018-06-22 DIAGNOSIS — N183 Chronic kidney disease, stage 3 (moderate): Secondary | ICD-10-CM | POA: Diagnosis not present

## 2018-06-22 DIAGNOSIS — Z87891 Personal history of nicotine dependence: Secondary | ICD-10-CM | POA: Insufficient documentation

## 2018-06-22 DIAGNOSIS — M109 Gout, unspecified: Secondary | ICD-10-CM | POA: Insufficient documentation

## 2018-06-22 DIAGNOSIS — I130 Hypertensive heart and chronic kidney disease with heart failure and stage 1 through stage 4 chronic kidney disease, or unspecified chronic kidney disease: Secondary | ICD-10-CM | POA: Insufficient documentation

## 2018-06-22 DIAGNOSIS — E1122 Type 2 diabetes mellitus with diabetic chronic kidney disease: Secondary | ICD-10-CM | POA: Insufficient documentation

## 2018-06-22 DIAGNOSIS — G4733 Obstructive sleep apnea (adult) (pediatric): Secondary | ICD-10-CM | POA: Insufficient documentation

## 2018-06-22 DIAGNOSIS — Z79899 Other long term (current) drug therapy: Secondary | ICD-10-CM | POA: Insufficient documentation

## 2018-06-22 MED ORDER — HYDRALAZINE HCL 25 MG PO TABS: 12.5000 mg | ORAL_TABLET | Freq: Three times a day (TID) | ORAL | 5 refills | Status: DC

## 2018-06-22 MED FILL — hydrALAZINE HCL 25 MG TABS: 25 | 30 days supply | Qty: 45 | Fill #0

## 2018-06-22 NOTE — Patient Instructions (Signed)
It was great to meet you today!  Please START hydralazine 12.5 mg (1/2 tablet) THREE TIMES DAILY.   You are scheduled with the pharmacist again on 10/3 and 10/24.

## 2018-06-22 NOTE — Progress Notes (Signed)
HF MD: [name]  HPI:  Jeffrey [name] a 42 y.o.malewith a history of poorly controlled hypertension, R renal cell carcinoma s/p nephrectomy, DM2, OSA, gout, morbid obesity and systolic HF due tononischemic myopathy.   Admitted to [facility] in 3/16 secondary to atypical chest pain, dyspnea, and mild troponin elevation. Echo during hospitalization shows "severely reduced LV function" - EF not quantified due to poor windows. He was diuresed and was down about 7 pounds at discharge, at 289 on the hospital scale.   Admitted 05/14/15 with CP at rest. CXR looked improved from previous. CP resolved spontaneously after hospitalization and troponin was flat. R/LHC showed normal coronaries and moderately elevated filling pressures with moderately depressed cardiac output. [place] subcutaneous ICD was implanted 05/17/15.   Admitted 9/4 - 12/04/10 with A/C systolic CHF in the setting of new afib. Started on IV lasix and amiodarone. Pt diuresed and converted to NSR without requiring DCCV. Started on Eliquis for [facility]. Continued on po amio on discharge with wean over several weeks.  Admitted 3/27 - 01/01/18 with ICD shock. Electrolytes stable. Low grade fever and volume overload noted. Diuresed 7 lbs and treated with course of ABX. Echo 12/31/17 with LVEF 25-30%.   He presents today for pharmacist-led HF medication titration. At last HF clinic visit on 8/22, he was started on carvedilol 3.125 mg BID. Feelinggood although he has been having some family issues lately. Because of these issues he has not been able to jog like he was (3 miles/day in the past) but will restart today with one of his sons. He is still doing 100 situps and 50 pushups. He is still coaching football. Has 14 children (7 mo to 40 yo) and 8 grandchildren.    Shortness of breath/dyspnea on exertion? no   Orthopnea/PND? no  Edema? no  Lightheadedness/dizziness? Yes - after taking his medications (sometimes lasts 5-6 hours;  also has virtigo though)  Daily weights at home? Yes - stable 215 lb   Blood pressure/heart rate monitoring at home? no  Following low-sodium/fluid-restricted diet? Yes - sometimes >2L fluid when working out/coaching football; eating baked fish, chicken, [place]; cut out rice, potatoes, bread, sugar; has 2 veggies at dinner (1 raw, 1 cooked) -- whole family eating this way   HF Medications: Carvedilol 3.125 mg PO BID KCl 20 mEq PO daily Entresto 97-103 mg PO BID  Imdur 30 mg PO daily Torsemide 40 mg PO daily   Has the patient been experiencing any side effects to the medications prescribed?  yes - some dizziness upon taking all at once  Does the patient have any problems obtaining medications due to transportation or finances?   no - [place] Medicaid  Understanding of regimen: good Understanding of indications: good Potential of compliance: good Patient understands to avoid NSAIDs. Patient understands to avoid decongestants.   Pertinent Lab Values:  05/26/18: Serum creatinine 1.52 (BL ~1.5-1.8), BUN 20, Potassium 4.2, Sodium 138, BNP 93.3  Vital Signs:  Weight: 323.6 lb (dry weight: 315 lb)  Blood pressure: 138/82 mmHg   Heart rate: 70 bpm   Assessment: 1. Chronicsystolic CHF (EF 16-24%), due to NICM. NYHA class I-IIsymptoms. - Volume status stable despite slight weight gain (likely 2/2 not jogging anymore) - Start hydralazine 12.5 mg TID cautiously with previous hypotension and current dizziness -- if tolerates, consider switch to Bidil at next visit since covered by Medicaid - Continue carvedilol 3.125 mg BID, Imdur 30 mg daily, KCl 20 meq daily, Entresto 97/103 mg BID and torsemide 40  mg daily  - Off spiro with recent AKI and tenuous BP. Watch kidney function closely with solitary kidney. Will consider attempting to reinitiate at future visit  - Also advised patient to stagger his medications in an attempt to prevent the dizziness that he's feeling when taking them  all at once - Basic disease state pathophysiology, medication indication, mechanism and side effects reviewed at length with patient and he verbalized understanding 2. Paroxysmal Afib - New diagnosis 06/2017 - Has been having some blood in stools with Eliquis - has appt with PCP to evaluate - Continue amiodarone200 daily - Will need yearly eye exams.Need to watch TFTs closely 3. VT with ICD shock 12/29/17 - Continue amiodarone200 mg daily. - No driving until 0/05/11. Pt aware.  4. Hypertension:  - Meds as above  5. CKD stage III - BMET stable on last lab work. Creatinine 1.48 11/18/18and 1.61 on 02/10/18 6. OSA:  - He has a defunct CPAP machine with confirmed diagnosis years ago and poor follow up.  - He will need a repeat sleeps study in order to get a new CPAP machine.Will place referral 7. Tobacco use: - Former smoker. No change.  8. Gout:  - Continue allopurinol 200 daily for prophylaxis. No change.  9. DM2:  - Stable. Per PCP.  10.Renal Cell Carcinoma - S/P nephrectomy 2014.  - BMET today. Follow closely in setting of solitary kidney.  11. Morbid Obesity -Body mass index is 49.37 kg/m. - Encouraged weight loss.   Plan: 1) Medication changes: Based on clinical presentation, vital signs and recent labs will start hydralazine 12.5 mg TID 2) Labs: PRN 3) Follow-up: Pharmacy visit on 10/3 and 10/24 and Dr. [name] on 12/19   Ruta Hinds. Velva Harman, PharmD, BCPS, CPP Clinical Pharmacist Phone: [phone] 06/13/2018 3:49 PM

## 2018-07-07 ENCOUNTER — Ambulatory Visit (HOSPITAL_BASED_OUTPATIENT_CLINIC_OR_DEPARTMENT_OTHER): Payer: Medicaid Other | Attending: Internal Medicine | Admitting: Cardiology

## 2018-07-07 ENCOUNTER — Inpatient Hospital Stay (HOSPITAL_COMMUNITY): Admission: RE | Admit: 2018-07-07 | Payer: Medicaid Other | Source: Ambulatory Visit

## 2018-07-07 DIAGNOSIS — I493 Ventricular premature depolarization: Secondary | ICD-10-CM | POA: Insufficient documentation

## 2018-07-07 DIAGNOSIS — G4733 Obstructive sleep apnea (adult) (pediatric): Secondary | ICD-10-CM | POA: Insufficient documentation

## 2018-07-07 DIAGNOSIS — R0902 Hypoxemia: Secondary | ICD-10-CM | POA: Diagnosis not present

## 2018-07-11 ENCOUNTER — Telehealth: Payer: Self-pay | Admitting: Cardiology

## 2018-07-11 ENCOUNTER — Ambulatory Visit: Payer: Medicaid Other | Admitting: Family Medicine

## 2018-07-11 ENCOUNTER — Ambulatory Visit (INDEPENDENT_AMBULATORY_CARE_PROVIDER_SITE_OTHER): Payer: Medicaid Other | Admitting: *Deleted

## 2018-07-11 DIAGNOSIS — I472 Ventricular tachycardia, unspecified: Secondary | ICD-10-CM

## 2018-07-11 NOTE — Procedures (Signed)
Patient Name: Jeffrey [name], Jeffrey [name] Date: 07/07/2018   Gender: Male  D.O.B: 03-29-1976  Age (years): 65  Referring Provider: Shaune Pascal Bensimhon  Height (inches): 38  Interpreting Physician: Fransico Him MD, ABSM  Weight (lbs): 320  RPSGT: Lanae Boast  BMI: 50  MRN: [id number]  Neck Size: 19.50   CLINICAL INFORMATION  Sleep Study Type: Split Night CPAP Indication for sleep study: OSA Epworth Sleepiness Score: 6  SLEEP STUDY TECHNIQUE  As per the AASM Manual for the Scoring of Sleep and Associated Events v2.3 (April 2016) with a hypopnea requiring 4% desaturations. The channels recorded and monitored were frontal, central and occipital EEG, electrooculogram (EOG), submentalis EMG (chin), nasal and oral airflow, thoracic and abdominal wall motion, anterior tibialis EMG, snore microphone, electrocardiogram, and pulse oximetry. Continuous positive airway pressure (CPAP) was initiated when the patient met split night criteria and was titrated according to treat sleep-disordered breathing.  MEDICATIONS  Medications self-administered by patient taken the night of the study : N/A  RESPIRATORY PARAMETERS  Diagnostic Total AHI (/hr): 63.9 RDI (/hr): 68.3 OA Index (/hr): 54.1 CA Index (/hr): 0.0  REM AHI (/hr): 14.1 NREM AHI (/hr): 67.6 Supine AHI (/hr): 95.4 Non-supine AHI (/hr): 61.8  Min O2 Sat (%): 74.0 Mean O2 (%): 92.1 Time below 88% (min): 15.1      Titration Optimal Pressure (cm): 13 AHI at Optimal Pressure (/hr): 0.0 Min O2 at Optimal Pressure (%): 90.0  Supine % at Optimal (%): 100 Sleep % at Optimal (%): 100       SLEEP ARCHITECTURE  The recording time for the entire night was 369.8 minutes. During a baseline period of 181.3 minutes, the patient slept for 123.0 minutes in REM and nonREM, yielding a sleep efficiency of 67.9%%. Sleep onset after lights out was 51.3 minutes with a REM latency of 48.0 minutes. The patient spent 42.3%% of the night in stage N1 sleep,  50.8%% in stage N2 sleep, 0.0%% in stage N3 and 6.9% in REM. During the titration period of 185.5 minutes, the patient slept for 180.6 minutes in REM and nonREM, yielding a sleep efficiency of 97.3%%. Sleep onset after CPAP initiation was 0.9 minutes with a REM latency of 45.5 minutes. The patient spent 7.8%% of the night in stage N1 sleep, 63.4%% in stage N2 sleep, 0.0%% in stage N3 and 28.8% in REM.  CARDIAC DATA  The 2 lead EKG demonstrated sinus rhythm. The mean heart rate was 100.0 beats per minute. Other EKG findings include: PVCs.   LEG MOVEMENT DATA  The total Periodic Limb Movements of Sleep (PLMS) were 0. The PLMS index was 0.0 .  IMPRESSIONS  Severe obstructive sleep apnea occurred during the diagnostic portion of the study (AHI = 63.9/hour). An optimal PAP pressure was selected for this patient ( 13 cm of water)  No significant central sleep apnea occurred during the diagnostic portion of the study (CAI = 0.0/hour).  Moderate oxygen desaturation was noted during the diagnostic portion of the study (Min O2 =74.0%).  The patient snored with moderate snoring volume during the diagnostic portion of the study.  EKG findings include PVCs.  Clinically significant periodic limb movements did not occur during sleep.  DIAGNOSIS  Obstructive Sleep Apnea (327.23 [G47.33 ICD-10]) Nocturnal Hypoxemia  RECOMMENDATIONS  Trial of CPAP therapy on 13 cm H2O with a Large size [employer]ips Respironics Nasal Pillow Mask Nuance Pro Gel mask and heated humidification. Avoid alcohol, sedatives and other CNS depressants that may worsen sleep apnea and disrupt  normal sleep architecture.  Sleep hygiene should be reviewed to assess factors that may improve sleep quality.  Weight management and regular exercise should be initiated or continued.  Return to Sleep Center for re-evaluation after 10 weeks of therapy  [Electronically signed] 07/11/2018 08:34 AM Fransico Him MD, ABSM  Diplomate, American Board of  Sleep Medicine

## 2018-07-11 NOTE — Telephone Encounter (Signed)
Spoke with pt and reminded pt of remote transmission that is due today. Pt verbalized understanding.   

## 2018-07-12 ENCOUNTER — Ambulatory Visit (HOSPITAL_COMMUNITY)
Admission: RE | Admit: 2018-07-12 | Discharge: 2018-07-12 | Disposition: A | Discharge status: Home / Self Care | Payer: Medicaid Other | Source: Ambulatory Visit | Attending: Cardiology | Admitting: Cardiology

## 2018-07-12 ENCOUNTER — Other Ambulatory Visit: Payer: Self-pay | Admitting: Family Medicine

## 2018-07-12 DIAGNOSIS — Z6841 Body Mass Index (BMI) 40.0 and over, adult: Secondary | ICD-10-CM | POA: Diagnosis not present

## 2018-07-12 DIAGNOSIS — Z905 Acquired absence of kidney: Secondary | ICD-10-CM | POA: Diagnosis not present

## 2018-07-12 DIAGNOSIS — G4733 Obstructive sleep apnea (adult) (pediatric): Secondary | ICD-10-CM | POA: Insufficient documentation

## 2018-07-12 DIAGNOSIS — E119 Type 2 diabetes mellitus without complications: Secondary | ICD-10-CM | POA: Diagnosis not present

## 2018-07-12 DIAGNOSIS — I472 Ventricular tachycardia: Secondary | ICD-10-CM | POA: Diagnosis not present

## 2018-07-12 DIAGNOSIS — M109 Gout, unspecified: Secondary | ICD-10-CM | POA: Diagnosis not present

## 2018-07-12 DIAGNOSIS — C641 Malignant neoplasm of right kidney, except renal pelvis: Secondary | ICD-10-CM | POA: Insufficient documentation

## 2018-07-12 DIAGNOSIS — I480 Paroxysmal atrial fibrillation: Secondary | ICD-10-CM | POA: Insufficient documentation

## 2018-07-12 DIAGNOSIS — N183 Chronic kidney disease, stage 3 (moderate): Secondary | ICD-10-CM | POA: Insufficient documentation

## 2018-07-12 DIAGNOSIS — Z87891 Personal history of nicotine dependence: Secondary | ICD-10-CM | POA: Insufficient documentation

## 2018-07-12 DIAGNOSIS — I5022 Chronic systolic (congestive) heart failure: Secondary | ICD-10-CM | POA: Insufficient documentation

## 2018-07-12 DIAGNOSIS — I130 Hypertensive heart and chronic kidney disease with heart failure and stage 1 through stage 4 chronic kidney disease, or unspecified chronic kidney disease: Secondary | ICD-10-CM | POA: Diagnosis not present

## 2018-07-12 MED ORDER — ISOSORB DINITRATE-HYDRALAZINE 20-37.5 MG PO TABS: 1.0000 | ORAL_TABLET | Freq: Three times a day (TID) | ORAL | 5 refills | Status: DC

## 2018-07-12 MED FILL — GABAPENTIN 300 MG CAPSULE: 300 | 30 days supply | Qty: 90 | Fill #0

## 2018-07-12 MED FILL — BIDIL TABLET: 20-37.5 | 30 days supply | Qty: 90 | Fill #0

## 2018-07-12 MED FILL — MITIGARE 0.6 MG CAPSULE: 0.6 | 30 days supply | Qty: 60 | Fill #1

## 2018-07-12 NOTE — Progress Notes (Signed)
HF MD: Jeffrey [name]  HPI: Jeffrey [name] a 42 y.o.malewith a history of poorly controlled hypertension, R renal cell carcinoma s/p nephrectomy, DM2, OSA, gout, morbid obesity and systolic HF due tononischemic myopathy.   Admitted to [facility] in 3/16 secondary to atypical chest pain, dyspnea, and mild troponin elevation. Echo during hospitalization shows "severely reduced LV function" - EF not quantified due to poor windows. He was diuresed and was down about 7 pounds at discharge, at 289 on the hospital scale.   Admitted 05/14/15 with CP at rest. CXR looked improved from previous. CP resolved spontaneously after hospitalization and troponin was flat. R/LHC showed normal coronaries and moderately elevated filling pressures with moderately depressed cardiac output. [name] subcutaneous ICD was implanted 05/17/15.   Admitted 9/4 - 02/04/65 with A/C systolic CHF in the setting of new afib. Started on IV lasix and amiodarone. Pt diuresed and converted to NSR without requiring DCCV. Started on Eliquis for [facility]. Continued on po amio on discharge with wean over several weeks.  Admitted 3/27 - 01/01/18 with ICD shock. Electrolytes stable. Low grade fever and volume overload noted. Diuresed 7 lbs and treated with course of ABX. Echo 12/31/17 with LVEF 25-30%.   He presents today forpharmacist-led HF medication titration.At last HF pharmacy clinic visit on 9/18, he was started on hydralazine 12.5 mg TID. Feelinggoodand has starting jogging again (~3 miles/day). He isstill doing100 situps and 50 pushups. Dizziness after taking am meds (even after staggering the medications) remains stable. He is still coaching football. Has 14 children (7 mo to 9 yo) and 8 grandchildren.   Shortness of breath/dyspnea on exertion?no  Orthopnea/PND?no  Edema?no  Lightheadedness/dizziness?Yes- after takinghis medications (sometimes lasts5-6 hours; also has virtigothough)  Daily weights at  home?Yes- stable 215 lb  Blood pressure/heart rate monitoring at home?no  Following low-sodium/fluid-restricted diet?Yes- sometimes >2L fluid when working out/coaching football;eating bakedfish, chicken, turkey;cut out rice, potatoes, bread, sugar; has2 veggies at dinner(1 raw, 1 cooked) -- whole family eating this way  HF Medications: Carvedilol 3.125 mg PO BID Hydralazine 12.5 mg PO TID KCl 20 mEq PO daily Entresto 97-103 mg PO BID  Imdur 30 mg PO daily Torsemide 40 mg PO daily  Has the patient been experiencing any side effects to the medications prescribed?yes- some dizziness upon taking all at once (even after staggering them)  Does the patient have any problems obtaining medications due to transportation or finances?no- [place] [employer]  Understanding of regimen:good Understanding of indications:good Potential of compliance:good Patient understands to avoid NSAIDs. Patient understands to avoid decongestants.   Pertinent Lab Values:  05/26/18:Serum creatinine1.52 (BL ~1.5-1.8), BUN20, Potassium4.2, Sodium138, BNP93.3  Vital Signs:  Weight:319.4 lb(dry weight:315 lb)  Blood pressure:126/88 mmHg  Heart rate:65 bpm  Assessment: 1. Chronicsystolic CHF (YQ03-47%), due toNICM. NYHA classI-IIsymptoms. - Volume statusstable and down ~4 lb from last clinic visit  -Stop hydralazine and Imdur and switch to Bidil 1 tablet TID - Continuecarvedilol 3.125 mg BID, KCl 20 meq daily,Entresto 97/103 mg BIDand torsemide 40 mg daily - Off spiro with recent AKI and tenuous BP. Watch kidney function closely with solitary kidney. Will consider attempting to reinitiate at future visit - Basic disease state pathophysiology, medication indication, mechanism and side effects reviewed at length with patient and he verbalized understanding 2. Paroxysmal Afib - New diagnosis 06/2017 - Continue amiodarone200 daily and Eliquis 5 mg BID - Will need  yearly eye exams.Need to watch TFTs closely 3. VT with ICD shock 12/29/17 - Continue amiodarone200 mg daily. - No driving  until 07/01/18. Pt aware.  4. Hypertension:  - Meds as above  5. CKD stage III - BMETstable on last lab work.Creatinine 1.48 11/18/18and 1.61 on 02/10/18 and 1.52 on 05/26/18 6. OSA:  - He has a defunct CPAP machine with confirmed diagnosis years ago and poor follow up.  - Repeat sleep study done last week - awaiting results 7. Tobacco use: - Former smoker. No change.  8. Gout:  - Continue allopurinol 200 daily for prophylaxis. No change.  9. DM2:  - Stable. Per PCP.  10.Renal Cell Carcinoma - S/P nephrectomy 2014.  - Follow closely in setting of solitary kidney.  11. Morbid Obesity -Body mass index is 49.37 kg/m. - Continued to encourage weight loss and congratulated on starting on his exercise regimen again   Plan: 1) Medication changes: Based on clinical presentation, vital signs and recent labs willswitch to Bidil 1 tab TID  2) Labs: Next visit 3) Follow-up:Pharmacy visit on 10/24 and NP/PA on 12/19   Jeffrey [name] K. Velva Harman, PharmD, BCPS, [place] Clinical Pharmacist Phone: [phone]

## 2018-07-12 NOTE — Progress Notes (Signed)
Remote ICD transmission.   

## 2018-07-12 NOTE — Patient Instructions (Addendum)
It was great to see you today!  Please STOP hydralazine and isosorbide mononitrate and START Bidil 1 tablet THREE TIMES DAILY.   Please keep your appointment with the pharmacist on 10/24 and the NP/PA on 12/19.

## 2018-07-14 ENCOUNTER — Telehealth: Payer: Self-pay

## 2018-07-14 ENCOUNTER — Telehealth: Payer: Self-pay | Admitting: *Deleted

## 2018-07-14 NOTE — Telephone Encounter (Signed)
-----   Message from Sueanne Margarita, MD sent at 07/11/2018  8:36 AM EDT ----- Please let patient know that they have significant sleep apnea and had successful PAP titration and will be set up with PAP unit.  Please let DME know that order is in EPIC.  Please set patient up for OV in 10 weeks

## 2018-07-14 NOTE — Telephone Encounter (Signed)
Patient will be at appointment on 10/15 at 3:00pm.

## 2018-07-14 NOTE — Telephone Encounter (Signed)
Informed patient of sleep study results and patient understanding was verbalized. Patient understands his sleep study showed they have significant sleep apnea and had successful PAP titration and will be set up with PAP unit. Upon patient request DME selection is [facility]. Patient understands his will be contacted by [facility] to set up his cpap. Patient understands to call if [facility] does not contact him with new setup in a timely manner. Patient understands they will be called once confirmation has been received from [facility] that they have received their new machine to schedule 10 week follow up appointment.  [facility] notified of new cpap order  Please add to airview Patient was grateful for the call and thanked me.

## 2018-07-19 ENCOUNTER — Ambulatory Visit: Payer: Medicaid Other | Admitting: Family Medicine

## 2018-07-25 ENCOUNTER — Ambulatory Visit (INDEPENDENT_AMBULATORY_CARE_PROVIDER_SITE_OTHER): Payer: Medicaid Other | Admitting: Family Medicine

## 2018-07-25 ENCOUNTER — Encounter: Payer: Self-pay | Admitting: Family Medicine

## 2018-07-25 VITALS — BP 136/78 | HR 84 | Temp 98.0°F | Ht 67.0 in | Wt 318.0 lb

## 2018-07-25 DIAGNOSIS — Z09 Encounter for follow-up examination after completed treatment for conditions other than malignant neoplasm: Secondary | ICD-10-CM | POA: Diagnosis not present

## 2018-07-25 DIAGNOSIS — M545 Low back pain, unspecified: Secondary | ICD-10-CM

## 2018-07-25 DIAGNOSIS — N183 Chronic kidney disease, stage 3 unspecified: Secondary | ICD-10-CM

## 2018-07-25 DIAGNOSIS — I10 Essential (primary) hypertension: Secondary | ICD-10-CM

## 2018-07-25 DIAGNOSIS — Z794 Long term (current) use of insulin: Secondary | ICD-10-CM

## 2018-07-25 DIAGNOSIS — E1122 Type 2 diabetes mellitus with diabetic chronic kidney disease: Secondary | ICD-10-CM

## 2018-07-25 DIAGNOSIS — R42 Dizziness and giddiness: Secondary | ICD-10-CM | POA: Diagnosis not present

## 2018-07-25 DIAGNOSIS — G8929 Other chronic pain: Secondary | ICD-10-CM | POA: Diagnosis not present

## 2018-07-25 DIAGNOSIS — M25561 Pain in right knee: Secondary | ICD-10-CM

## 2018-07-25 DIAGNOSIS — M25562 Pain in left knee: Secondary | ICD-10-CM | POA: Diagnosis not present

## 2018-07-25 LAB — POCT URINALYSIS DIP (MANUAL ENTRY)
Bilirubin, UA: NEGATIVE
Glucose, UA: NEGATIVE mg/dL
Ketones, POC UA: NEGATIVE mg/dL
Leukocytes, UA: NEGATIVE
Nitrite, UA: NEGATIVE
Protein Ur, POC: NEGATIVE mg/dL
Spec Grav, UA: 1.01 (ref 1.010–1.025)
Urobilinogen, UA: 0.2 E.U./dL
pH, UA: 5.5 (ref 5.0–8.0)

## 2018-07-25 LAB — POCT GLYCOSYLATED HEMOGLOBIN (HGB A1C): Hemoglobin A1C: 6.1 % — AB (ref 4.0–5.6)

## 2018-07-25 MED ORDER — TRAMADOL HCL 50 MG PO TABS
50.0000 mg | ORAL_TABLET | Freq: Four times a day (QID) | ORAL | 0 refills | Status: DC | PRN
Start: 2018-07-25 — End: 2018-07-25

## 2018-07-25 MED ORDER — TRAMADOL HCL 50 MG PO TABS: 50.0000 mg | ORAL_TABLET | Freq: Four times a day (QID) | ORAL | 0 refills | Status: DC | PRN

## 2018-07-25 NOTE — Progress Notes (Signed)
Follow Up  Subjective:    Patient ID: Jeffrey [name], male    DOB: [birth year], 42 y.o.   MRN: [id number]  Chief Complaint  Patient presents with  . Follow-up    chronic condition    HPI  Jeffrey [name] is 42 year old male with a past medical history of Ventricular Tachycardia, Diabetes, Renal Cell Carcinoma, Nephrolithiasis, Morbid Obesity, Hypertension, Hyperlipidemia, CKD, Asthma, and Anxiety. He is here today for follow up and assessment of his chronic conditions.    Current Status: Since his last office visit, he is doing well with no complaints. He has discontinued smoking cigarettes. He states that he currently uses Marijuana daily. He has occasional dizziness. He has occasional low back pain and chronic bilateral knee pain. He continues to work on weight loss. He walks at least 2 miles a day. He drinks plenty of water daily.   He denies fevers, chills, fatigue, recent infections, weight loss, and night sweats. He has not had any headaches, visual changes, and falls. No chest pain, heart palpitations, cough and shortness of breath reported. No reports of GI problems such as nausea, vomiting, diarrhea, and constipation. He has no reports of blood in stools, dysuria and hematuria. No depression or anxiety, and denies suicidal ideations, homicidal ideations, or auditory hallucinations. He denies pain today.   Past Medical History:  Diagnosis Date  . Anxiety   . Aspirin allergy   . Childhood asthma   . Chronic systolic CHF (congestive heart failure) ([place])    a. EF 20-25% in 2012. b. EF 45-50% in 10/2011 with nonischemic nuc - presumed NICM. c. 12/2014 Echo: Sev depressed LV fxn, sev dil LV, mild LVH, mild MR, sev dil LA, mildly reduced RV fxn.  . CKD (chronic kidney disease) stage 2, GFR 60-89 ml/min   . High cholesterol   . Hypertension   . Morbid obesity ([place])   . Nephrolithiasis   . OSA on CPAP   . Presumed NICM    a. 04/2014 Myoview: EF 26%, glob HK, sev glob HK, ? prior infarct;  b.  Never cathed 2/2 CKD.  [name] Renal cell carcinoma ([place])    a. s/p Rt robotic assisted partial converted to radical nephrectomy on 01/2013.  . Troponin level elevated    a. 04/2014, 12/2014: felt due to CHF.  . Type II diabetes mellitus ([place])   . Ventricular tachycardia ([place])    a. appropriate ICD therapy 12/2017    Family History  Problem Relation Age of Onset  . Hypertension Mother   . Diabetes Maternal Aunt   . Heart attack Neg Hx   . Stroke Neg Hx     Social History   Socioeconomic History  . Marital status: Significant Other    Spouse name: Not on file  . Number of children: Not on file  . Years of education: Not on file  . Highest education level: Not on file  Occupational History  . Not on file  Social Needs  . Financial resource strain: Not on file  . Food insecurity:    Worry: Not on file    Inability: Not on file  . Transportation needs:    Medical: Not on file    Non-medical: Not on file  Tobacco Use  . Smoking status: Former Smoker    Packs/day: 0.25    Years: 22.00    Pack years: 5.50    Last attempt to quit: 04/16/2015    Years since quitting: 3.2  . Smokeless tobacco: Never  Used  Substance and Sexual Activity  . Alcohol use: No    Comment: occ   . Drug use: Yes    Frequency: 14.0 times per week    Types: Marijuana    Comment: uses marijuana twice a day  . Sexual activity: Yes    Birth control/protection: Condom  Lifestyle  . Physical activity:    Days per week: Not on file    Minutes per session: Not on file  . Stress: Not on file  Relationships  . Social connections:    Talks on phone: Not on file    Gets together: Not on file    Attends religious service: Not on file    Active member of club or organization: Not on file    Attends meetings of clubs or organizations: Not on file    Relationship status: Not on file  . Intimate partner violence:    Fear of current or ex partner: Not on file    Emotionally abused: Not on file    Physically  abused: Not on file    Forced sexual activity: Not on file  Other Topics Concern  . Not on file  Social History Narrative  . Not on file    Past Surgical History:  Procedure Laterality Date  . APPENDECTOMY  07/2004  . CARDIAC CATHETERIZATION N/A 05/17/2015   Procedure: Right/Left Heart Cath and Coronary Angiography;  Surgeon: Jolaine Artist, MD;  Location: [place] [facility];  Service: Cardiovascular;  Laterality: N/A;  . EP IMPLANTABLE DEVICE N/A 05/17/2015   Procedure: SubQ ICD Implant;  Surgeon: Will Meredith Leeds, MD;  Location: [place] [facility];  Service: Cardiovascular;  Laterality: N/A;  . ROBOTIC ASSISTED LAPAROSCOPIC LYSIS OF ADHESION  01/13/2013   Procedure: ROBOTIC ASSISTED LAPAROSCOPIC LYSIS OF ADHESION EXTENSIVE;  Surgeon: Alexis Frock, MD;  Location: [facility];  Service: Urology;;  . ROBOTIC ASSITED PARTIAL NEPHRECTOMY Right 01/13/2013   Procedure: ROBOTIC ASSITED PARTIAL NEPHRECTOMY CONVERTED TO ROBOTIC ASSISTED RIGHT RADICAL NEPHRECTOMY;  Surgeon: Alexis Frock, MD;  Location: [facility];  Service: Urology;  Laterality: Right;    Immunization History  Administered Date(s) Administered  . Influenza Split 10/24/2011  . Influenza,inj,Quad PF,6+ Mos 07/23/2017  . Pneumococcal Conjugate-13 11/01/2017  . Pneumococcal Polysaccharide-23 10/24/2011  . Tdap 04/07/2015    Current Meds  Medication Sig  . allopurinol (ZYLOPRIM) 100 MG tablet Take 1 tablet (100 mg total) by mouth daily.  [name] amiodarone (PACERONE) 200 MG tablet Take 200 mg by mouth daily.  [name] apixaban (ELIQUIS) 5 MG TABS tablet Take 1 tablet (5 mg total) by mouth 2 (two) times daily.  [name] atorvastatin (LIPITOR) 80 MG tablet TAKE 1 TABLET (80 MG TOTAL) BY MOUTH EVERY EVENING.  . carvedilol (COREG) 3.125 MG tablet Take 1 tablet (3.125 mg total) by mouth 2 (two) times daily.  [name] gabapentin (NEURONTIN) 300 MG capsule TAKE 1 CAPSULE BY MOUTH 3 TIMES DAILY.  [name] insulin NPH-regular Human (NOVOLIN 70/30) (70-30) 100 UNIT/ML  injection Inject 34 Units into the skin 2 (two) times daily with a meal.  . isosorbide-hydrALAZINE (BIDIL) 20-37.5 MG tablet Take 1 tablet by mouth 3 (three) times daily.  . Magnesium Oxide 200 MG TABS Take 100 mg by mouth daily.  . meclizine (ANTIVERT) 25 MG tablet Take 1 tablet (25 mg total) by mouth 3 (three) times daily as needed for dizziness (Take meclizine for spinning dizziness. You may also take Valium if your symptoms are not relieved by the meclizine alone.).  [name] MITIGARE  0.6 MG CAPS Take 1 tablet by mouth 2 (two) times daily.  . potassium chloride (K-DUR) 10 MEQ tablet Take 2 tablets (20 mEq total) by mouth daily.  . sacubitril-valsartan (ENTRESTO) 97-103 MG Take 1 tablet by mouth 2 (two) times daily.  [name] torsemide (DEMADEX) 20 MG tablet Take 2 tablets (40 mg total) by mouth daily.  . traMADol (ULTRAM) 50 MG tablet Take 1 tablet (50 mg total) by mouth every 6 (six) hours as needed.  . [DISCONTINUED] traMADol (ULTRAM) 50 MG tablet Take 1 tablet (50 mg total) by mouth every 6 (six) hours as needed.  . [DISCONTINUED] traMADol (ULTRAM) 50 MG tablet Take 1 tablet (50 mg total) by mouth every 6 (six) hours as needed.  . [DISCONTINUED] traMADol (ULTRAM) 50 MG tablet Take 1 tablet (50 mg total) by mouth every 6 (six) hours as needed.   Allergies  Allergen Reactions  . Aspirin Shortness Of Breath, Itching and Rash     Burning sensation (Patient reports he tolerates other NSAIDS)   . Bee Venom Hives and Swelling  . Lisinopril Cough  . Tomato Rash    BP 136/78   Pulse 84   Temp 98 F (36.7 C) (Oral)   Ht 5\' 7"  (1.702 m)   Wt (!) 318 lb (144.2 kg)   SpO2 100%   BMI 49.81 kg/m   Review of Systems  Eyes: Negative.   Respiratory: Negative.   Cardiovascular: Negative.   Gastrointestinal: Positive for abdominal distention (Obese).  Genitourinary: Negative.   Musculoskeletal: Positive for arthralgias (chronic bilateral knee pain) and back pain (chronic low back pain).  Skin: Negative.    Allergic/Immunologic: Negative.   Neurological: Positive for dizziness (Occasional).  Psychiatric/Behavioral: Negative.     Objective:   Physical Exam  Constitutional: He is oriented to person, place, and time. He appears well-developed and well-nourished.  HENT:  Head: Normocephalic and atraumatic.  Right Ear: External ear normal.  Left Ear: External ear normal.  Nose: Nose normal.  Mouth/Throat: Oropharynx is clear and moist.  Eyes: Pupils are equal, round, and reactive to light. Conjunctivae and EOM are normal.  Neck: Normal range of motion. Neck supple.  Cardiovascular: Normal rate, regular rhythm, normal heart sounds and intact distal pulses.  Pulmonary/Chest: Effort normal and breath sounds normal.  Abdominal: Soft. Bowel sounds are normal.  Musculoskeletal: Normal range of motion.  Neurological: He is alert and oriented to person, place, and time.  Skin: Skin is warm and dry.  Psychiatric: He has a normal mood and affect. His behavior is normal. Judgment and thought content normal.  Nursing note and vitals reviewed.  Assessment & Plan:   1. Type 2 diabetes mellitus with stage 3 chronic kidney disease, with long-term current use of insulin (HCC) Hgb A1c is improved at 6.1 today, from 6.3 on 05/10/2018. Continue medications as prescribed. He will continue to decrease foods/beverages high in sugars and carbs and follow Heart Healthy or DASH diet. Increase physical activity to at least 30 minutes cardio exercise daily.  - POCT glycosylated hemoglobin (Hb A1C) - POCT urinalysis dipstick  2. Essential hypertension Antihypertensive medications are effective. Blood pressure is stable at 136/78 today. Continue medications as prescribed. He will continue to decrease high sodium intake, excessive alcohol intake, increase potassium intake, smoking cessation, and increase physical activity of at least 30 minutes of cardio activity daily. He will continue to follow Heart Healthy or DASH  diet.  3. Dizziness Continue Meclizine as prescribed. Ambulate slowly when changing positions.   4.  Chronic pain of both knees - traMADol (ULTRAM) 50 MG tablet; Take 1 tablet (50 mg total) by mouth every 6 (six) hours as needed.  Dispense: 30 tablet; Refill: 0  5. Low back pain without sciatica, unspecified back pain laterality, unspecified chronicity Refill - traMADol (ULTRAM) 50 MG tablet; Take 1 tablet (50 mg total) by mouth every 6 (six) hours as needed.  Dispense: 30 tablet; Refill: 0  6. Follow up He will follow up in 3 months.   Meds ordered this encounter  Medications  . DISCONTD: traMADol (ULTRAM) 50 MG tablet    Sig: Take 1 tablet (50 mg total) by mouth every 6 (six) hours as needed.    Dispense:  20 tablet    Refill:  0    Order Specific Question:   Supervising Provider    Answer:   Tresa Garter [id number]  . DISCONTD: traMADol (ULTRAM) 50 MG tablet    Sig: Take 1 tablet (50 mg total) by mouth every 6 (six) hours as needed.    Dispense:  30 tablet    Refill:  0    Order Specific Question:   Supervising Provider    Answer:   Tresa Garter [id number]  . traMADol (ULTRAM) 50 MG tablet    Sig: Take 1 tablet (50 mg total) by mouth every 6 (six) hours as needed.    Dispense:  30 tablet    Refill:  0    Kathe Becton,  MSN, FNP-C [facility] [address] [place], [place] [address] [phone]

## 2018-07-26 LAB — CUP PACEART REMOTE DEVICE CHECK
Date Time Interrogation Session: 20191022113857
Implantable Lead Location: 753862
MDC IDC LEAD IMPLANT DT: 20160812
MDC IDC PG IMPLANT DT: 20160812
Pulse Gen Serial Number: 117162

## 2018-07-28 ENCOUNTER — Inpatient Hospital Stay (HOSPITAL_COMMUNITY)
Admission: RE | Admit: 2018-07-28 | Discharge: 2018-07-28 | Disposition: A | Discharge status: Home / Self Care | Payer: Medicaid Other | Source: Ambulatory Visit

## 2018-08-04 ENCOUNTER — Other Ambulatory Visit: Payer: Self-pay

## 2018-08-04 ENCOUNTER — Inpatient Hospital Stay (HOSPITAL_COMMUNITY)
Admission: EM | Admit: 2018-08-04 | Discharge: 2018-08-07 | DRG: 194 | Disposition: A | Discharge status: Home / Self Care | Payer: Medicaid Other | Attending: Internal Medicine | Admitting: Internal Medicine

## 2018-08-04 ENCOUNTER — Other Ambulatory Visit (HOSPITAL_COMMUNITY): Payer: Medicaid Other

## 2018-08-04 ENCOUNTER — Encounter (HOSPITAL_COMMUNITY): Payer: Self-pay | Admitting: Emergency Medicine

## 2018-08-04 ENCOUNTER — Emergency Department (HOSPITAL_COMMUNITY): Payer: Medicaid Other

## 2018-08-04 DIAGNOSIS — G4733 Obstructive sleep apnea (adult) (pediatric): Secondary | ICD-10-CM | POA: Diagnosis present

## 2018-08-04 DIAGNOSIS — Z87442 Personal history of urinary calculi: Secondary | ICD-10-CM

## 2018-08-04 DIAGNOSIS — Z91018 Allergy to other foods: Secondary | ICD-10-CM

## 2018-08-04 DIAGNOSIS — Z6841 Body Mass Index (BMI) 40.0 and over, adult: Secondary | ICD-10-CM

## 2018-08-04 DIAGNOSIS — Z85528 Personal history of other malignant neoplasm of kidney: Secondary | ICD-10-CM

## 2018-08-04 DIAGNOSIS — M109 Gout, unspecified: Secondary | ICD-10-CM | POA: Diagnosis present

## 2018-08-04 DIAGNOSIS — Z886 Allergy status to analgesic agent status: Secondary | ICD-10-CM

## 2018-08-04 DIAGNOSIS — R05 Cough: Secondary | ICD-10-CM | POA: Diagnosis not present

## 2018-08-04 DIAGNOSIS — J181 Lobar pneumonia, unspecified organism: Principal | ICD-10-CM | POA: Diagnosis present

## 2018-08-04 DIAGNOSIS — Z794 Long term (current) use of insulin: Secondary | ICD-10-CM

## 2018-08-04 DIAGNOSIS — Z8249 Family history of ischemic heart disease and other diseases of the circulatory system: Secondary | ICD-10-CM

## 2018-08-04 DIAGNOSIS — J189 Pneumonia, unspecified organism: Secondary | ICD-10-CM | POA: Diagnosis not present

## 2018-08-04 DIAGNOSIS — I480 Paroxysmal atrial fibrillation: Secondary | ICD-10-CM | POA: Diagnosis present

## 2018-08-04 DIAGNOSIS — Z888 Allergy status to other drugs, medicaments and biological substances status: Secondary | ICD-10-CM

## 2018-08-04 DIAGNOSIS — E785 Hyperlipidemia, unspecified: Secondary | ICD-10-CM | POA: Diagnosis present

## 2018-08-04 DIAGNOSIS — E7800 Pure hypercholesterolemia, unspecified: Secondary | ICD-10-CM | POA: Diagnosis present

## 2018-08-04 DIAGNOSIS — I10 Essential (primary) hypertension: Secondary | ICD-10-CM

## 2018-08-04 DIAGNOSIS — I428 Other cardiomyopathies: Secondary | ICD-10-CM | POA: Diagnosis present

## 2018-08-04 DIAGNOSIS — Z79899 Other long term (current) drug therapy: Secondary | ICD-10-CM

## 2018-08-04 DIAGNOSIS — E1122 Type 2 diabetes mellitus with diabetic chronic kidney disease: Secondary | ICD-10-CM | POA: Diagnosis present

## 2018-08-04 DIAGNOSIS — Z833 Family history of diabetes mellitus: Secondary | ICD-10-CM

## 2018-08-04 DIAGNOSIS — E1169 Type 2 diabetes mellitus with other specified complication: Secondary | ICD-10-CM | POA: Diagnosis present

## 2018-08-04 DIAGNOSIS — E1159 Type 2 diabetes mellitus with other circulatory complications: Secondary | ICD-10-CM | POA: Diagnosis present

## 2018-08-04 DIAGNOSIS — I152 Hypertension secondary to endocrine disorders: Secondary | ICD-10-CM | POA: Diagnosis present

## 2018-08-04 DIAGNOSIS — Z87891 Personal history of nicotine dependence: Secondary | ICD-10-CM

## 2018-08-04 DIAGNOSIS — N183 Chronic kidney disease, stage 3 unspecified: Secondary | ICD-10-CM | POA: Diagnosis present

## 2018-08-04 DIAGNOSIS — Z7901 Long term (current) use of anticoagulants: Secondary | ICD-10-CM

## 2018-08-04 DIAGNOSIS — I110 Hypertensive heart disease with heart failure: Secondary | ICD-10-CM | POA: Diagnosis present

## 2018-08-04 DIAGNOSIS — Z91030 Bee allergy status: Secondary | ICD-10-CM

## 2018-08-04 DIAGNOSIS — Z905 Acquired absence of kidney: Secondary | ICD-10-CM

## 2018-08-04 DIAGNOSIS — I5022 Chronic systolic (congestive) heart failure: Secondary | ICD-10-CM | POA: Diagnosis present

## 2018-08-04 LAB — CBC WITH DIFFERENTIAL/PLATELET
ABS IMMATURE GRANULOCYTES: 0.04 10*3/uL (ref 0.00–0.07)
Basophils Absolute: 0.1 10*3/uL (ref 0.0–0.1)
Basophils Relative: 1 %
EOS ABS: 0.1 10*3/uL (ref 0.0–0.5)
Eosinophils Relative: 1 %
HCT: 49.1 % (ref 39.0–52.0)
Hemoglobin: 16 g/dL (ref 13.0–17.0)
IMMATURE GRANULOCYTES: 0 %
LYMPHS ABS: 1.4 10*3/uL (ref 0.7–4.0)
Lymphocytes Relative: 15 %
MCH: 28.2 pg (ref 26.0–34.0)
MCHC: 32.6 g/dL (ref 30.0–36.0)
MCV: 86.6 fL (ref 80.0–100.0)
Monocytes Absolute: 0.8 10*3/uL (ref 0.1–1.0)
Monocytes Relative: 8 %
NEUTROS PCT: 75 %
Neutro Abs: 7.2 10*3/uL (ref 1.7–7.7)
PLATELETS: 224 10*3/uL (ref 150–400)
RBC: 5.67 MIL/uL (ref 4.22–5.81)
RDW: 15.1 % (ref 11.5–15.5)
WBC: 9.6 10*3/uL (ref 4.0–10.5)
nRBC: 0 % (ref 0.0–0.2)

## 2018-08-04 LAB — BASIC METABOLIC PANEL
Anion gap: 9 (ref 5–15)
BUN: 14 mg/dL (ref 6–20)
CO2: 22 mmol/L (ref 22–32)
Calcium: 9.4 mg/dL (ref 8.9–10.3)
Chloride: 105 mmol/L (ref 98–111)
Creatinine, Ser: 1.5 mg/dL — ABNORMAL HIGH (ref 0.61–1.24)
GFR calc Af Amer: >60 mL/min (ref >60)
GFR, EST NON AFRICAN AMERICAN: 55 mL/min — AB (ref >60)
GLUCOSE: 116 mg/dL — AB (ref 70–99)
POTASSIUM: 3.6 mmol/L (ref 3.5–5.1)
SODIUM: 136 mmol/L (ref 135–145)

## 2018-08-04 LAB — INFLUENZA PANEL BY PCR (TYPE A & B)
INFLAPCR: NEGATIVE
Influenza B By PCR: NEGATIVE

## 2018-08-04 MED ORDER — ACETAMINOPHEN 325 MG PO TABS
650.0000 mg | ORAL_TABLET | Freq: Once | ORAL | Status: AC
  Administered 2018-08-04: 650 mg via ORAL
  Filled 2018-08-04: qty 2

## 2018-08-04 MED ORDER — SODIUM CHLORIDE 0.9 % IV SOLN
1.0000 g | Freq: Once | INTRAVENOUS | Status: AC
  Administered 2018-08-04: 1 g via INTRAVENOUS
  Filled 2018-08-04: qty 10

## 2018-08-04 MED ORDER — AZITHROMYCIN 250 MG PO TABS
500.0000 mg | ORAL_TABLET | Freq: Once | ORAL | Status: AC
  Administered 2018-08-04: 500 mg via ORAL
  Filled 2018-08-04: qty 2

## 2018-08-04 NOTE — ED Triage Notes (Signed)
Pt reports productive cough with brown mucous, fever, generalized body aches that started last night. 101.75F in triage.

## 2018-08-04 NOTE — H&P (Signed)
History and Physical    Jeffrey [name] ACZ:[id number] DOB: Mar 14, 1976 DOA: 08/04/2018  PCP: [name], FNP  Patient coming from: Home  I have personally briefly reviewed patient's old medical records in [place]  Chief Complaint: Fever, chills, shortness of breath  HPI: Jeffrey [name] is a 42 y.o. male with medical history significant for chronic systolic CHF (EF 10-93%) s/p ICD, NICM, history of V. tach, paroxysmal atrial fibrillation on Eliquis, insulin-dependent T2DM, renal cell carcinoma status post nephrectomy, HTN, HLD, OSA, CKD, Gout, and obesity who presents with 1 day of sudden onset fevers, chills, diaphoresis, generalized body aches, dyspnea, and cough productive of yellow/greenish sputum.  He had an episode of diarrhea yesterday.  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  He is unaware of any sick contacts.  He says he has been stuck in bed all day due to feeling unwell.  He was concerned about having the flu or pneumonia and therefore presented to the emergency department.  He denies any chest pain, palpitations, peripheral edema, abdominal pain, constipation, or any obvious bleeding.  ED Course:  BP 134/96, pulse 82, RR 20, temp 101.53F, SPO2 98% on room air. CBC was within normal limits. BMP showed creatinine 1.5 which appears to be his recent baseline. Influenza panel was negative. Chest x-ray showed a left lingular infiltrate. He was started on ceftriaxone and azithromycin in the hospital service was consulted for potential admission due to patient's multiple comorbidities.  Review of Systems: As per HPI otherwise 10 point review of systems negative.    Past Medical History:  Diagnosis Date  . Anxiety   . Aspirin allergy   . Childhood asthma   . Chronic systolic CHF (congestive heart failure) ([place])    a. EF 20-25% in 2012. b. EF 45-50% in 10/2011 with nonischemic nuc - presumed NICM. c. 12/2014 Echo: Sev depressed LV fxn, sev dil  LV, mild LVH, mild MR, sev dil LA, mildly reduced RV fxn.  . CKD (chronic kidney disease) stage 2, GFR 60-89 ml/min   . High cholesterol   . Hypertension   . Morbid obesity ([place])   . Nephrolithiasis   . OSA on CPAP   . Presumed NICM    a. 04/2014 Myoview: EF 26%, glob HK, sev glob HK, ? prior infarct;  b. Never cathed 2/2 CKD.  [name] Renal cell carcinoma ([place])    a. s/p Rt robotic assisted partial converted to radical nephrectomy on 01/2013.  . Troponin level elevated    a. 04/2014, 12/2014: felt due to CHF.  . Type II diabetes mellitus ([place])   . Ventricular tachycardia ([place])    a. appropriate ICD therapy 12/2017    Past Surgical History:  Procedure Laterality Date  . APPENDECTOMY  07/2004  . CARDIAC CATHETERIZATION N/A 05/17/2015   Procedure: Right/Left Heart Cath and Coronary Angiography;  Surgeon: Jolaine Artist, MD;  Location: [facility];  Service: Cardiovascular;  Laterality: N/A;  . EP IMPLANTABLE DEVICE N/A 05/17/2015   Procedure: SubQ ICD Implant;  Surgeon: Will Meredith Leeds, MD;  Location: [facility];  Service: Cardiovascular;  Laterality: N/A;  . ROBOTIC ASSISTED LAPAROSCOPIC LYSIS OF ADHESION  01/13/2013   Procedure: ROBOTIC ASSISTED LAPAROSCOPIC LYSIS OF ADHESION EXTENSIVE;  Surgeon: Alexis Frock, MD;  Location: [facility];  Service: Urology;;  . ROBOTIC ASSITED PARTIAL NEPHRECTOMY Right 01/13/2013   Procedure: ROBOTIC ASSITED PARTIAL NEPHRECTOMY CONVERTED TO ROBOTIC ASSISTED RIGHT RADICAL NEPHRECTOMY;  Surgeon: Alexis Frock,  MD;  Location: [facility];  Service: Urology;  Laterality: Right;     reports that he quit smoking about 3 years ago. He has a 5.50 pack-year smoking history. He has never used smokeless tobacco. He reports that he has current or past drug history. Drug: Marijuana. Frequency: 14.00 times per week. He reports that he does not drink alcohol.  Allergies  Allergen Reactions  . Aspirin Shortness Of Breath, Itching and Rash     Burning sensation  (Patient reports he tolerates other NSAIDS)   . Bee Venom Hives and Swelling  . Lisinopril Cough  . Tomato Rash    Family History  Problem Relation Age of Onset  . Hypertension Mother   . Diabetes Maternal Aunt   . Heart attack Neg Hx   . Stroke Neg Hx      Prior to Admission medications   Medication Sig Start Date End Date Taking? Authorizing Provider  allopurinol (ZYLOPRIM) 100 MG tablet Take 1 tablet (100 mg total) by mouth daily. 02/17/17   Tresa Garter, MD  amiodarone (PACERONE) 200 MG tablet Take 200 mg by mouth daily.    [provider]  apixaban (ELIQUIS) 5 MG TABS tablet Take 1 tablet (5 mg total) by mouth 2 (two) times daily. 06/13/17   Lyda Jester M, PA-C  atorvastatin (LIPITOR) 80 MG tablet TAKE 1 TABLET (80 MG TOTAL) BY MOUTH EVERY EVENING. 02/24/18   Tresa Garter, MD  carvedilol (COREG) 3.125 MG tablet Take 1 tablet (3.125 mg total) by mouth 2 (two) times daily. 05/26/18 08/24/18  Bensimhon, Shaune Pascal, MD  colchicine 0.6 MG tablet TAKE 1 TABLET BY MOUTH 2 TIMES DAILY Patient not taking: Reported on 07/25/2018 04/12/18   [name], FNP  gabapentin (NEURONTIN) 300 MG capsule TAKE 1 CAPSULE BY MOUTH 3 TIMES DAILY. 07/12/18   [name], FNP  insulin NPH-regular Human (NOVOLIN 70/30) (70-30) 100 UNIT/ML injection Inject 34 Units into the skin 2 (two) times daily with a meal. 07/09/17   Jegede, Olugbemiga E, MD  isosorbide-hydrALAZINE (BIDIL) 20-37.5 MG tablet Take 1 tablet by mouth 3 (three) times daily. 07/12/18   Bensimhon, Shaune Pascal, MD  Magnesium Oxide 200 MG TABS Take 100 mg by mouth daily.    [provider]  meclizine (ANTIVERT) 25 MG tablet Take 1 tablet (25 mg total) by mouth 3 (three) times daily as needed for dizziness (Take meclizine for spinning dizziness. You may also take Valium if your symptoms are not relieved by the meclizine alone.). 05/10/18   [name], FNP  MITIGARE 0.6 MG CAPS Take 1 tablet by mouth 2  (two) times daily. 07/12/18   [provider]  potassium chloride (K-DUR) 10 MEQ tablet Take 2 tablets (20 mEq total) by mouth daily. 05/10/18   [name], FNP  sacubitril-valsartan (ENTRESTO) 97-103 MG Take 1 tablet by mouth 2 (two) times daily. 09/09/17   Tresa Garter, MD  torsemide (DEMADEX) 20 MG tablet Take 2 tablets (40 mg total) by mouth daily. 01/03/18   Bensimhon, Shaune Pascal, MD  traMADol (ULTRAM) 50 MG tablet Take 1 tablet (50 mg total) by mouth every 6 (six) hours as needed. 07/25/18   [name], FNP    Physical Exam: Vitals:   08/04/18 2130 08/04/18 2302 08/05/18 0000 08/05/18 0020  BP:  (!) 146/103 (!) 138/96   Pulse:  77 83   Resp:  20 20   Temp: 99 F (37.2 C)   (!) 100.5 F (  38.1 C)  TempSrc: Oral     SpO2:  97% 97%   Weight:      Height:        Constitutional: Resting in bed, NAD, calm, comfortable Eyes: PERRL, lids and conjunctivae normal ENMT: Mucous membranes are moist. Posterior pharynx clear of any exudate or lesions.Normal dentition.  Neck: normal, supple, mild cervical adenopathy Respiratory: clear to auscultation bilaterally, no wheezing, no crackles. Normal respiratory effort. No accessory muscle use.  Cardiovascular: Regular rate and rhythm, no murmurs / rubs / gallops. No extremity edema. 2+ pedal pulses.  Abdomen: no tenderness, no masses palpated. No hepatosplenomegaly. Bowel sounds positive.  Musculoskeletal: no clubbing / cyanosis. No joint deformity upper and lower extremities. Good ROM, no contractures. Normal muscle tone.  Skin: no rashes, lesions, ulcers. No induration Neurologic: CN 2-12 grossly intact. Sensation intact. Strength 5/5 in all 4.  Psychiatric: Normal judgment and insight. Alert and oriented x 3. Normal mood.     Labs on Admission: I have personally reviewed following labs and imaging studies  CBC: Recent Labs  Lab 08/04/18 2129  WBC 9.6  NEUTROABS 7.2  HGB 16.0  HCT 49.1  MCV 86.6  PLT 250    Basic Metabolic Panel: Recent Labs  Lab 08/04/18 2129  NA 136  K 3.6  CL 105  CO2 22  GLUCOSE 116*  BUN 14  CREATININE 1.50*  CALCIUM 9.4   GFR: Estimated Creatinine Clearance: 87.3 mL/min (A) (by C-G formula based on SCr of 1.5 mg/dL (H)). Liver Function Tests: No results for input(s): AST, ALT, ALKPHOS, BILITOT, PROT, ALBUMIN in the last 168 hours. No results for input(s): LIPASE, AMYLASE in the last 168 hours. No results for input(s): AMMONIA in the last 168 hours. Coagulation Profile: No results for input(s): INR, PROTIME in the last 168 hours. Cardiac Enzymes: No results for input(s): CKTOTAL, CKMB, CKMBINDEX, TROPONINI in the last 168 hours. BNP (last 3 results) No results for input(s): PROBNP in the last 8760 hours. HbA1C: No results for input(s): HGBA1C in the last 72 hours. CBG: No results for input(s): GLUCAP in the last 168 hours. Lipid Profile: No results for input(s): CHOL, HDL, LDLCALC, TRIG, CHOLHDL, LDLDIRECT in the last 72 hours. Thyroid Function Tests: No results for input(s): TSH, T4TOTAL, FREET4, T3FREE, THYROIDAB in the last 72 hours. Anemia Panel: No results for input(s): VITAMINB12, FOLATE, FERRITIN, TIBC, IRON, RETICCTPCT in the last 72 hours. Urine analysis:    Component Value Date/Time   COLORURINE YELLOW 12/29/2017 1933   APPEARANCEUR CLEAR 12/29/2017 1933   LABSPEC 1.020 12/29/2017 1933   PHURINE 5.0 12/29/2017 1933   GLUCOSEU NEGATIVE 12/29/2017 1933   HGBUR SMALL (A) 12/29/2017 1933   BILIRUBINUR negative 07/25/2018 1536   KETONESUR negative 07/25/2018 1536   KETONESUR NEGATIVE 12/29/2017 1933   PROTEINUR negative 07/25/2018 1536   PROTEINUR >=300 (A) 12/29/2017 1933   UROBILINOGEN 0.2 07/25/2018 1536   UROBILINOGEN 0.2 11/01/2017 1128   NITRITE Negative 07/25/2018 1536   NITRITE NEGATIVE 12/29/2017 1933   LEUKOCYTESUR Negative 07/25/2018 1536    Radiological Exams on Admission: Dg Chest 2 View  Result Date:  08/04/2018 CLINICAL DATA:  Cough. EXAM: CHEST - 2 VIEW COMPARISON:  Feb 10, 2018 FINDINGS: Left lingular infiltrate.  No other interval changes. IMPRESSION: Left lingular infiltrate, likely pneumonia given history. Recommend follow-up to resolution. Electronically Signed   By: Dorise Bullion III M.D   On: 08/04/2018 20:51    Assessment/Plan Principal Problem:   Lingular pneumonia Active Problems:   Hyperlipidemia  associated with type 2 diabetes mellitus ([place])   Hypertension associated with diabetes ([place])   Type 2 diabetes mellitus with stage 3 chronic kidney disease, with long-term current use of insulin (HCC)   OSA (obstructive sleep apnea)   Chronic systolic CHF (congestive heart failure) (HCC)   Gouty arthritis   CKD (chronic kidney disease), stage III (HCC)   PAF (paroxysmal atrial fibrillation) (HCC)   Jeffrey [name] is a 42 y.o. male with medical history significant for chronic systolic CHF (EF 90-93%) s/p ICD, NICM, history of V. tach, paroxysmal atrial fibrillation on Eliquis, insulin-dependent T2DM, renal cell carcinoma status post nephrectomy, HTN, HLD, OSA, CKD, Gout, and obesity who presents with 1 day of sudden onset fevers, chills, diaphoresis, generalized body aches, dyspnea, and cough productive of yellow/greenish sputum.  Chest x-ray showing a left lingular infiltrate.  Left lingular community-acquired pneumonia: Symptoms and chest x-ray findings are suspicious for an acquired pneumonia versus acute viral illness.  He has been started on CAP coverage.  Even his significant cardiac comorbidities I think it is reasonable to admit for observation overnight with continued IV antibiotic treatment. -Continue IV ceftriaxone and azithromycin -Flu panel negative, check RVP -Supportive care  Chronic systolic CHF (EF 11-21%) s/p ICD w/ hx of Vtach: Follows with Dr. [name].  Currently stable without signs of volume overload. -Continue home Coreg, Entresto, amiodarone, BiDil, and  torsemide -Strict I/O's, daily weights  Paroxysmal atrial fibrillation: CHADSVASc 3.  Currently rate controlled and appears in sinus rhythm by exam. -Continue home Eliquis -Continue home Coreg and amiodarone  Type 2 diabetes: Takes Novolin 70/30 34 units twice a day with meals at home. -Change to half dose home insulin 17 units twice a day while inpatient  Hypertension: Blood pressure borderline elevated here. -Continue home Coreg, Entresto, amiodarone, BiDil, and torsemide  Hyperlipidemia: -Continue home atorvastatin 80 mg daily  Gout: -Continue home allopurinol  OSA: Currently without CPAP machine but states he will have his new one delivered in 4 days. -CPAP nightly  DVT prophylaxis: Eliquis Code Status: Full code Family Communication: No family present at bedside Disposition Plan: Likely discharge to home in 1-2 days Consults called: None Admission status: Observation   Zada Finders MD [facility] Pager [phone]  If 7PM-7AM, please contact night-coverage [email or web address] Password [email or web address]  08/05/2018, 12:46 AM

## 2018-08-04 NOTE — ED Notes (Signed)
ED Provider at bedside. 

## 2018-08-04 NOTE — ED Provider Notes (Signed)
[facility] EMERGENCY DEPARTMENT Provider Note   CSN: [id number] Arrival date & time: 08/04/18  1851     History   Chief Complaint Chief Complaint  Patient presents with  . URI  . Fever    HPI Jeffrey [name] is a 42 y.o. male.  Patient with history of diabetes on insulin, congestive heart failure on apixaban and torsemide-- presents the emergency department today with acute onset of fever, chills, body aches, cough starting last night.  He denies any significant shortness of breath.  He does not feel like he is in a heart failure exacerbation.  No worsening lower extremity edema or orthopnea.  Cough is productive of brown sputum.  No hemoptysis.  No known sick contacts.  No treatments prior to arrival.  Patient is concerned because there is an 46-month baby at home.  No nausea, vomiting, or abdominal pain. The onset of this condition was acute. The course is constant. Aggravating factors: none. Alleviating factors: none.     URI   Associated symptoms include cough. Pertinent negatives include no chest pain, no abdominal pain, no diarrhea, no nausea, no vomiting, no dysuria, no congestion, no headaches, no rhinorrhea, no sore throat, no rash and no wheezing.  Fever   Associated symptoms include cough. Pertinent negatives include no chest pain, no diarrhea, no vomiting, no congestion, no headaches and no sore throat.    Past Medical History:  Diagnosis Date  . Anxiety   . Aspirin allergy   . Childhood asthma   . Chronic systolic CHF (congestive heart failure) ([place])    a. EF 20-25% in 2012. b. EF 45-50% in 10/2011 with nonischemic nuc - presumed NICM. c. 12/2014 Echo: Sev depressed LV fxn, sev dil LV, mild LVH, mild MR, sev dil LA, mildly reduced RV fxn.  . CKD (chronic kidney disease) stage 2, GFR 60-89 ml/min   . High cholesterol   . Hypertension   . Morbid obesity ([place])   . Nephrolithiasis   . OSA on CPAP   . Presumed NICM    a. 04/2014 Myoview: EF 26%, glob  HK, sev glob HK, ? prior infarct;  b. Never cathed 2/2 CKD.  [name] Renal cell carcinoma ([place])    a. s/p Rt robotic assisted partial converted to radical nephrectomy on 01/2013.  . Troponin level elevated    a. 04/2014, 12/2014: felt due to CHF.  . Type II diabetes mellitus ([place])   . Ventricular tachycardia ([place])    a. appropriate ICD therapy 12/2017    Patient Active Problem List   Diagnosis Date Noted  . Ventricular tachycardia ([place]) 12/30/2017  . PAF (paroxysmal atrial fibrillation) ([place])   . Dilated cardiomyopathy ([place])   . Pollen allergy 02/17/2017  . Dyslipidemia 02/17/2017  . Hematochezia 11/08/2015  . CKD (chronic kidney disease), stage III ([place]) 06/20/2015  . Cardiac device in situ, other   . Acute on chronic systolic (congestive) heart failure ([place]) 03/11/2015  . Gouty arthritis 03/11/2015  . Chest pain 12/28/2014  . Aspirin allergy 04/08/2014  . Renal cell carcinoma ([place])   . Gout attack 11/10/2011  . Morbid obesity ([place]) 10/23/2011  . OSA (obstructive sleep apnea) 10/23/2011  . Type 2 diabetes mellitus with stage 3 chronic kidney disease, with long-term current use of insulin ([place]) 10/22/2011  . [place], MILD 12/25/2010  . HYPERTENSION, BENIGN ESSENTIAL 12/25/2010    Past Surgical History:  Procedure Laterality Date  . APPENDECTOMY  07/2004  . CARDIAC CATHETERIZATION N/A 05/17/2015   Procedure:  Right/Left Heart Cath and Coronary Angiography;  Surgeon: Jolaine Artist, MD;  Location: [facility];  Service: Cardiovascular;  Laterality: N/A;  . EP IMPLANTABLE DEVICE N/A 05/17/2015   Procedure: SubQ ICD Implant;  Surgeon: Will Meredith Leeds, MD;  Location: [facility];  Service: Cardiovascular;  Laterality: N/A;  . ROBOTIC ASSISTED LAPAROSCOPIC LYSIS OF ADHESION  01/13/2013   Procedure: ROBOTIC ASSISTED LAPAROSCOPIC LYSIS OF ADHESION EXTENSIVE;  Surgeon: Alexis Frock, MD;  Location[facility];  Service: Urology;;  . ROBOTIC ASSITED PARTIAL NEPHRECTOMY  Right 01/13/2013   Procedure: ROBOTIC ASSITED PARTIAL NEPHRECTOMY CONVERTED TO ROBOTIC ASSISTED RIGHT RADICAL NEPHRECTOMY;  Surgeon: Alexis Frock, MD;  Location: [facility];  Service: Urology;  Laterality: Right;        Home Medications    Prior to Admission medications   Medication Sig Start Date End Date Taking? Authorizing Provider  allopurinol (ZYLOPRIM) 100 MG tablet Take 1 tablet (100 mg total) by mouth daily. 02/17/17   Tresa Garter, MD  amiodarone (PACERONE) 200 MG tablet Take 200 mg by mouth daily.    [provider]  apixaban (ELIQUIS) 5 MG TABS tablet Take 1 tablet (5 mg total) by mouth 2 (two) times daily. 06/13/17   Lyda Jester M, PA-C  atorvastatin (LIPITOR) 80 MG tablet TAKE 1 TABLET (80 MG TOTAL) BY MOUTH EVERY EVENING. 02/24/18   Tresa Garter, MD  carvedilol (COREG) 3.125 MG tablet Take 1 tablet (3.125 mg total) by mouth 2 (two) times daily. 05/26/18 08/24/18  Bensimhon, Shaune Pascal, MD  colchicine 0.6 MG tablet TAKE 1 TABLET BY MOUTH 2 TIMES DAILY Patient not taking: Reported on 07/25/2018 04/12/18   [name], FNP  gabapentin (NEURONTIN) 300 MG capsule TAKE 1 CAPSULE BY MOUTH 3 TIMES DAILY. 07/12/18   [name], FNP  insulin NPH-regular Human (NOVOLIN 70/30) (70-30) 100 UNIT/ML injection Inject 34 Units into the skin 2 (two) times daily with a meal. 07/09/17   Jegede, Olugbemiga E, MD  isosorbide-hydrALAZINE (BIDIL) 20-37.5 MG tablet Take 1 tablet by mouth 3 (three) times daily. 07/12/18   Bensimhon, Shaune Pascal, MD  Magnesium Oxide 200 MG TABS Take 100 mg by mouth daily.    [provider]  meclizine (ANTIVERT) 25 MG tablet Take 1 tablet (25 mg total) by mouth 3 (three) times daily as needed for dizziness (Take meclizine for spinning dizziness. You may also take Valium if your symptoms are not relieved by the meclizine alone.). 05/10/18   [name], FNP  MITIGARE 0.6 MG CAPS Take 1 tablet by mouth 2 (two) times daily. 07/12/18    [provider]  potassium chloride (K-DUR) 10 MEQ tablet Take 2 tablets (20 mEq total) by mouth daily. 05/10/18   [name], FNP  sacubitril-valsartan (ENTRESTO) 97-103 MG Take 1 tablet by mouth 2 (two) times daily. 09/09/17   Tresa Garter, MD  torsemide (DEMADEX) 20 MG tablet Take 2 tablets (40 mg total) by mouth daily. 01/03/18   Bensimhon, Shaune Pascal, MD  traMADol (ULTRAM) 50 MG tablet Take 1 tablet (50 mg total) by mouth every 6 (six) hours as needed. 07/25/18   [name], FNP    Family History Family History  Problem Relation Age of Onset  . Hypertension Mother   . Diabetes Maternal Aunt   . Heart attack Neg Hx   . Stroke Neg Hx     Social History Social History   Tobacco Use  . Smoking status: Former Smoker  Packs/day: 0.25    Years: 22.00    Pack years: 5.50    Last attempt to quit: 04/16/2015    Years since quitting: 3.3  . Smokeless tobacco: Never Used  Substance Use Topics  . Alcohol use: No    Comment: occ   . Drug use: Yes    Frequency: 14.0 times per week    Types: Marijuana    Comment: uses marijuana twice a day     Allergies   Aspirin; Bee venom; Lisinopril; and Tomato   Review of Systems Review of Systems  Constitutional: Positive for chills, fatigue and fever.  HENT: Negative for congestion, rhinorrhea and sore throat.   Eyes: Negative for redness.  Respiratory: Positive for cough. Negative for shortness of breath and wheezing.   Cardiovascular: Negative for chest pain.  Gastrointestinal: Negative for abdominal pain, diarrhea, nausea and vomiting.  Genitourinary: Negative for dysuria.  Musculoskeletal: Positive for myalgias.  Skin: Negative for rash.  Neurological: Negative for headaches.     Physical Exam Updated Vital Signs BP (!) 134/96 (BP Location: Right Arm)   Pulse 82   Temp 99 F (37.2 C) (Oral)   Resp 20   Ht 5\' 7"  (1.702 m)   Wt (!) 143.8 kg   SpO2 98%   BMI 49.65 kg/m   Physical Exam    Constitutional: He appears well-developed and well-nourished.  HENT:  Head: Normocephalic and atraumatic.  Mouth/Throat: Oropharynx is clear and moist.  Eyes: Conjunctivae are normal. Right eye exhibits no discharge. Left eye exhibits no discharge.  Neck: Normal range of motion. Neck supple.  Cardiovascular: Normal rate, regular rhythm and normal heart sounds.  Pulmonary/Chest: Effort normal and breath sounds normal. No stridor. No respiratory distress. He has no wheezes. He has no rales.  Abdominal: Soft. There is no tenderness. There is no rebound and no guarding.  Musculoskeletal: He exhibits edema. He exhibits no tenderness.  Trace bilateral edema to the ankles  Neurological: He is alert.  Skin: Skin is warm and dry.  Psychiatric: He has a normal mood and affect.  Nursing note and vitals reviewed.    ED Treatments / Results  Labs (all labs ordered are listed, but only abnormal results are displayed) Labs Reviewed  BASIC METABOLIC PANEL - Abnormal; Notable for the following components:      Result Value   Glucose, Bld 116 (*)    Creatinine, Ser 1.50 (*)    GFR calc non Af Amer 55 (*)    All other components within normal limits  INFLUENZA PANEL BY PCR (TYPE A & B)  CBC WITH DIFFERENTIAL/PLATELET    EKG None  Radiology Dg Chest 2 View  Result Date: 08/04/2018 CLINICAL DATA:  Cough. EXAM: CHEST - 2 VIEW COMPARISON:  Feb 10, 2018 FINDINGS: Left lingular infiltrate.  No other interval changes. IMPRESSION: Left lingular infiltrate, likely pneumonia given history. Recommend follow-up to resolution. Electronically Signed   By: Dorise Bullion III M.D   On: 08/04/2018 20:51    Procedures Procedures (including critical care time)  Medications Ordered in ED Medications  cefTRIAXone (ROCEPHIN) 1 g in sodium chloride 0.9 % 100 mL IVPB (has no administration in time range)  acetaminophen (TYLENOL) tablet 650 mg (650 mg Oral Given 08/04/18 1908)  azithromycin (ZITHROMAX) tablet  500 mg (500 mg Oral Given 08/04/18 2124)     Initial Impression / Assessment and Plan / ED Course  I have reviewed the triage vital signs and the nursing notes.  Pertinent labs & imaging  results that were available during my care of the patient were reviewed by me and considered in my medical decision making (see chart for details).     Patient seen and examined.  Chest x-ray reviewed by myself.  Demonstrates left lingular pneumonia.  Flu swabs are negative.  Given comorbidities will check lab work, have patient ambulate.  He will be started on oral antibiotics.  Vital signs reviewed and are as follows: BP (!) 134/96 (BP Location: Right Arm)   Pulse 82   Temp 99 F (37.2 C) (Oral)   Resp 20   Ht 5\' 7"  (1.702 m)   Wt (!) 143.8 kg   SpO2 98%   BMI 49.65 kg/m   10:38 PM patient ambulated with shortness of breath per his report.  Oxygen level dropped to 91%.  Overall, lab work is reassuring.  Blood sugar is relatively controlled.  Discussed case with Dr. Melina Copa.  On a conversation with the patient regarding going home versus coming to the hospital.  Given his shortness of breath he is concerned about going home at this time.  I will give patient a dose of IV Rocephin and discuss with hospitalist.  12:07 AM spoke with hospitalist.  They will evaluate for observation.   Final Clinical Impressions(s) / ED Diagnoses   Final diagnoses:  Community acquired pneumonia of left lower lobe of lung ([place])   CAP in setting of compensated CHF and diabetes.  ED Discharge Orders    None       Carlisle Cater, PA-C 08/05/18 0007    Hayden Rasmussen, MD 08/05/18 1143

## 2018-08-04 NOTE — ED Notes (Signed)
Pt dropped to 91% while ambulating, denies SOB

## 2018-08-04 NOTE — ED Provider Notes (Signed)
Patient placed in Quick Look pathway, seen and evaluated   Chief Complaint: cough, fever  HPI: Jeffrey [name] is a 42 y.o. male who presents to the ED with productive cough with brown mucous, fever and generalized body aches that started last night and are worse today.  ROS: Generalized: fever, body aches  Resp: cough Physical Exam:  BP (!) 134/96 (BP Location: Right Arm)   Pulse 82   Temp (!) 101.9 F (38.8 C) (Oral)   Resp 20   Ht 5\' 7"  (1.702 m)   Wt (!) 143.8 kg   SpO2 98%   BMI 49.65 kg/m    Gen: No distress  Neuro: Awake and Alert  Resp: no wheezing or rales heard  Heart: regular rate and rhythm   Initiation of care has begun. The patient has been counseled on the process, plan, and necessity for staying for the completion/evaluation, and the remainder of the medical screening examination    Ashley Murrain, NP 08/04/18 1910    Tegeler, Gwenyth Allegra, MD 08/05/18 0040

## 2018-08-05 ENCOUNTER — Other Ambulatory Visit: Payer: Self-pay

## 2018-08-05 DIAGNOSIS — Z6841 Body Mass Index (BMI) 40.0 and over, adult: Secondary | ICD-10-CM | POA: Diagnosis not present

## 2018-08-05 DIAGNOSIS — G4733 Obstructive sleep apnea (adult) (pediatric): Secondary | ICD-10-CM | POA: Diagnosis not present

## 2018-08-05 DIAGNOSIS — Z886 Allergy status to analgesic agent status: Secondary | ICD-10-CM | POA: Diagnosis not present

## 2018-08-05 DIAGNOSIS — I428 Other cardiomyopathies: Secondary | ICD-10-CM | POA: Diagnosis present

## 2018-08-05 DIAGNOSIS — Z87442 Personal history of urinary calculi: Secondary | ICD-10-CM | POA: Diagnosis not present

## 2018-08-05 DIAGNOSIS — N183 Chronic kidney disease, stage 3 (moderate): Secondary | ICD-10-CM

## 2018-08-05 DIAGNOSIS — M109 Gout, unspecified: Secondary | ICD-10-CM | POA: Diagnosis present

## 2018-08-05 DIAGNOSIS — I480 Paroxysmal atrial fibrillation: Secondary | ICD-10-CM | POA: Diagnosis present

## 2018-08-05 DIAGNOSIS — Z79899 Other long term (current) drug therapy: Secondary | ICD-10-CM | POA: Diagnosis not present

## 2018-08-05 DIAGNOSIS — Z91030 Bee allergy status: Secondary | ICD-10-CM | POA: Diagnosis not present

## 2018-08-05 DIAGNOSIS — E785 Hyperlipidemia, unspecified: Secondary | ICD-10-CM | POA: Diagnosis not present

## 2018-08-05 DIAGNOSIS — I152 Hypertension secondary to endocrine disorders: Secondary | ICD-10-CM | POA: Diagnosis present

## 2018-08-05 DIAGNOSIS — E1169 Type 2 diabetes mellitus with other specified complication: Secondary | ICD-10-CM

## 2018-08-05 DIAGNOSIS — Z85528 Personal history of other malignant neoplasm of kidney: Secondary | ICD-10-CM | POA: Diagnosis not present

## 2018-08-05 DIAGNOSIS — E7800 Pure hypercholesterolemia, unspecified: Secondary | ICD-10-CM | POA: Diagnosis present

## 2018-08-05 DIAGNOSIS — E1122 Type 2 diabetes mellitus with diabetic chronic kidney disease: Secondary | ICD-10-CM | POA: Diagnosis present

## 2018-08-05 DIAGNOSIS — J181 Lobar pneumonia, unspecified organism: Secondary | ICD-10-CM | POA: Diagnosis present

## 2018-08-05 DIAGNOSIS — I5022 Chronic systolic (congestive) heart failure: Secondary | ICD-10-CM

## 2018-08-05 DIAGNOSIS — Z87891 Personal history of nicotine dependence: Secondary | ICD-10-CM | POA: Diagnosis not present

## 2018-08-05 DIAGNOSIS — J189 Pneumonia, unspecified organism: Secondary | ICD-10-CM | POA: Diagnosis not present

## 2018-08-05 DIAGNOSIS — Z888 Allergy status to other drugs, medicaments and biological substances status: Secondary | ICD-10-CM | POA: Diagnosis not present

## 2018-08-05 DIAGNOSIS — Z794 Long term (current) use of insulin: Secondary | ICD-10-CM | POA: Diagnosis not present

## 2018-08-05 DIAGNOSIS — R05 Cough: Secondary | ICD-10-CM | POA: Diagnosis not present

## 2018-08-05 DIAGNOSIS — Z7901 Long term (current) use of anticoagulants: Secondary | ICD-10-CM | POA: Diagnosis not present

## 2018-08-05 DIAGNOSIS — I110 Hypertensive heart disease with heart failure: Secondary | ICD-10-CM | POA: Diagnosis present

## 2018-08-05 LAB — EXPECTORATED SPUTUM ASSESSMENT W GRAM STAIN, RFLX TO RESP C

## 2018-08-05 LAB — CBC
HEMATOCRIT: 47.6 % (ref 39.0–52.0)
HEMOGLOBIN: 15.2 g/dL (ref 13.0–17.0)
MCH: 27.5 pg (ref 26.0–34.0)
MCHC: 31.9 g/dL (ref 30.0–36.0)
MCV: 86.1 fL (ref 80.0–100.0)
Platelets: 221 10*3/uL (ref 150–400)
RBC: 5.53 MIL/uL (ref 4.22–5.81)
RDW: 15.1 % (ref 11.5–15.5)
WBC: 9.5 10*3/uL (ref 4.0–10.5)
nRBC: 0 % (ref 0.0–0.2)

## 2018-08-05 LAB — RESPIRATORY PANEL BY PCR
Adenovirus: NOT DETECTED
BORDETELLA PERTUSSIS-RVPCR: NOT DETECTED
CORONAVIRUS OC43-RVPPCR: NOT DETECTED
Chlamydophila pneumoniae: NOT DETECTED
Coronavirus 229E: NOT DETECTED
Coronavirus HKU1: NOT DETECTED
Coronavirus NL63: NOT DETECTED
INFLUENZA B-RVPPCR: NOT DETECTED
Influenza A: NOT DETECTED
METAPNEUMOVIRUS-RVPPCR: NOT DETECTED
Mycoplasma pneumoniae: NOT DETECTED
PARAINFLUENZA VIRUS 1-RVPPCR: NOT DETECTED
PARAINFLUENZA VIRUS 2-RVPPCR: NOT DETECTED
PARAINFLUENZA VIRUS 3-RVPPCR: NOT DETECTED
Parainfluenza Virus 4: NOT DETECTED
RESPIRATORY SYNCYTIAL VIRUS-RVPPCR: NOT DETECTED
RHINOVIRUS / ENTEROVIRUS - RVPPCR: NOT DETECTED

## 2018-08-05 LAB — BASIC METABOLIC PANEL
Anion gap: 6 (ref 5–15)
BUN: 14 mg/dL (ref 6–20)
CHLORIDE: 107 mmol/L (ref 98–111)
CO2: 23 mmol/L (ref 22–32)
Calcium: 9.4 mg/dL (ref 8.9–10.3)
Creatinine, Ser: 1.49 mg/dL — ABNORMAL HIGH (ref 0.61–1.24)
GFR calc Af Amer: >60 mL/min (ref >60)
GFR, EST NON AFRICAN AMERICAN: 56 mL/min — AB (ref >60)
GLUCOSE: 121 mg/dL — AB (ref 70–99)
POTASSIUM: 3.6 mmol/L (ref 3.5–5.1)
SODIUM: 136 mmol/L (ref 135–145)

## 2018-08-05 LAB — GLUCOSE, CAPILLARY
GLUCOSE-CAPILLARY: 110 mg/dL — AB (ref 70–99)
Glucose-Capillary: 147 mg/dL — ABNORMAL HIGH (ref 70–99)
Glucose-Capillary: 127 mg/dL — ABNORMAL HIGH (ref 70–99)
Glucose-Capillary: 109 mg/dL — ABNORMAL HIGH (ref 70–99)

## 2018-08-05 LAB — EXPECTORATED SPUTUM ASSESSMENT W REFEX TO RESP CULTURE

## 2018-08-05 LAB — STREP PNEUMONIAE URINARY ANTIGEN: Strep Pneumo Urinary Antigen: NEGATIVE

## 2018-08-05 LAB — HIV ANTIBODY (ROUTINE TESTING W REFLEX): HIV Screen 4th Generation wRfx: NONREACTIVE

## 2018-08-05 MED ORDER — MECLIZINE HCL 25 MG PO TABS
25.0000 mg | ORAL_TABLET | Freq: Once | ORAL | Status: AC
  Administered 2018-08-05: 25 mg via ORAL
  Filled 2018-08-05: qty 1

## 2018-08-05 MED ORDER — AMIODARONE HCL 200 MG PO TABS
200.0000 mg | ORAL_TABLET | Freq: Every day | ORAL | Status: DC
  Administered 2018-08-05 – 2018-08-07 (×3): 200 mg via ORAL
  Filled 2018-08-05 (×3): qty 1

## 2018-08-05 MED ORDER — SODIUM CHLORIDE 0.9 % IV SOLN
1.0000 g | INTRAVENOUS | Status: DC
  Administered 2018-08-05 – 2018-08-06 (×2): 1 g via INTRAVENOUS
  Filled 2018-08-05 (×2): qty 10

## 2018-08-05 MED ORDER — ACETAMINOPHEN 650 MG RE SUPP: 650.0000 mg | Freq: Four times a day (QID) | RECTAL | Status: DC | PRN

## 2018-08-05 MED ORDER — SODIUM CHLORIDE 0.9% FLUSH
3.0000 mL | Freq: Two times a day (BID) | INTRAVENOUS | Status: DC
  Administered 2018-08-05 – 2018-08-06 (×3): 3 mL via INTRAVENOUS

## 2018-08-05 MED ORDER — ACETAMINOPHEN 500 MG PO TABS
1000.0000 mg | ORAL_TABLET | Freq: Four times a day (QID) | ORAL | Status: DC | PRN
  Administered 2018-08-05 (×2): 1000 mg via ORAL
  Filled 2018-08-05 (×2): qty 2

## 2018-08-05 MED ORDER — ALLOPURINOL 100 MG PO TABS
100.0000 mg | ORAL_TABLET | Freq: Every day | ORAL | Status: DC
  Administered 2018-08-05 – 2018-08-07 (×3): 100 mg via ORAL
  Filled 2018-08-05 (×3): qty 1

## 2018-08-05 MED ORDER — ACETAMINOPHEN 325 MG PO TABS
650.0000 mg | ORAL_TABLET | Freq: Four times a day (QID) | ORAL | Status: DC | PRN
  Administered 2018-08-05 (×2): 650 mg via ORAL
  Filled 2018-08-05 (×2): qty 2

## 2018-08-05 MED ORDER — TORSEMIDE 20 MG PO TABS
40.0000 mg | ORAL_TABLET | Freq: Every day | ORAL | Status: DC
  Administered 2018-08-05 – 2018-08-07 (×3): 40 mg via ORAL
  Filled 2018-08-05 (×3): qty 2

## 2018-08-05 MED ORDER — ISOSORB DINITRATE-HYDRALAZINE 20-37.5 MG PO TABS
1.0000 | ORAL_TABLET | Freq: Three times a day (TID) | ORAL | Status: DC
  Administered 2018-08-05 – 2018-08-07 (×7): 1 via ORAL
  Filled 2018-08-05 (×7): qty 1

## 2018-08-05 MED ORDER — APIXABAN 5 MG PO TABS
5.0000 mg | ORAL_TABLET | Freq: Two times a day (BID) | ORAL | Status: DC
  Administered 2018-08-05 – 2018-08-07 (×5): 5 mg via ORAL
  Filled 2018-08-05 (×7): qty 1

## 2018-08-05 MED ORDER — SACUBITRIL-VALSARTAN 97-103 MG PO TABS
1.0000 | ORAL_TABLET | Freq: Two times a day (BID) | ORAL | Status: DC
  Administered 2018-08-05 – 2018-08-07 (×5): 1 via ORAL
  Filled 2018-08-05 (×6): qty 1

## 2018-08-05 MED ORDER — OXYCODONE HCL 5 MG PO TABS
5.0000 mg | ORAL_TABLET | Freq: Four times a day (QID) | ORAL | Status: DC | PRN
  Administered 2018-08-05 – 2018-08-07 (×7): 5 mg via ORAL
  Filled 2018-08-05 (×7): qty 1

## 2018-08-05 MED ORDER — INSULIN ASPART PROT & ASPART (70-30 MIX) 100 UNIT/ML ~~LOC~~ SUSP
17.0000 [IU] | Freq: Two times a day (BID) | SUBCUTANEOUS | Status: DC
  Administered 2018-08-05 – 2018-08-07 (×5): 17 [IU] via SUBCUTANEOUS
  Filled 2018-08-05: qty 10

## 2018-08-05 MED ORDER — POTASSIUM CHLORIDE CRYS ER 10 MEQ PO TBCR
20.0000 meq | EXTENDED_RELEASE_TABLET | Freq: Every day | ORAL | Status: DC
  Administered 2018-08-05 – 2018-08-07 (×3): 20 meq via ORAL
  Filled 2018-08-05 (×3): qty 2

## 2018-08-05 MED ORDER — ATORVASTATIN CALCIUM 80 MG PO TABS
80.0000 mg | ORAL_TABLET | Freq: Every day | ORAL | Status: DC
  Administered 2018-08-05 – 2018-08-06 (×2): 80 mg via ORAL
  Filled 2018-08-05 (×2): qty 1

## 2018-08-05 MED ORDER — CARVEDILOL 3.125 MG PO TABS
3.1250 mg | ORAL_TABLET | Freq: Two times a day (BID) | ORAL | Status: DC
  Administered 2018-08-05 – 2018-08-07 (×5): 3.125 mg via ORAL
  Filled 2018-08-05 (×5): qty 1

## 2018-08-05 MED ORDER — GABAPENTIN 300 MG PO CAPS
300.0000 mg | ORAL_CAPSULE | Freq: Three times a day (TID) | ORAL | Status: DC
  Administered 2018-08-05 – 2018-08-07 (×7): 300 mg via ORAL
  Filled 2018-08-05 (×7): qty 1

## 2018-08-05 MED ORDER — AZITHROMYCIN 250 MG PO TABS
500.0000 mg | ORAL_TABLET | Freq: Every day | ORAL | Status: DC
  Administered 2018-08-06 – 2018-08-07 (×2): 500 mg via ORAL
  Filled 2018-08-05 (×2): qty 2

## 2018-08-05 MED ORDER — GUAIFENESIN-DM 100-10 MG/5ML PO SYRP
5.0000 mL | ORAL_SOLUTION | ORAL | Status: DC | PRN
  Administered 2018-08-05 – 2018-08-06 (×4): 5 mL via ORAL
  Filled 2018-08-05 (×4): qty 5

## 2018-08-05 NOTE — Progress Notes (Signed)
Progress Note    Jeffrey [name]  [id number] DOB: 1976-03-13  DOA: 08/04/2018 PCP: [name], FNP    Brief Narrative:     Medical records reviewed and are as summarized below:  Jeffrey [name] is an 42 y.o. male with medical history significant for chronic systolic CHF (EF 00-93%) s/p ICD, NICM, history of V. tach, paroxysmal atrial fibrillation on Eliquis, insulin-dependent T2DM, renal cell carcinoma status post nephrectomy, HTN, HLD, OSA, CKD, Gout, and obesity who presents with 1 day of sudden onset fevers, chills, diaphoresis, generalized body aches, dyspnea, and cough productive of yellow/greenish sputum.  He had an episode of diarrhea yesterday.  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  He is unaware of any sick contacts.  He says he has been stuck in bed all day due to feeling unwell.  He was concerned about having the flu or pneumonia and therefore presented to the emergency department.  He denies any chest pain, palpitations, peripheral edema, abdominal pain, constipation, or any obvious bleeding.  Assessment/Plan:   Principal Problem:   Lingular pneumonia Active Problems:   Hyperlipidemia associated with type 2 diabetes mellitus ([place])   Hypertension associated with diabetes ([place])   Type 2 diabetes mellitus with stage 3 chronic kidney disease, with long-term current use of insulin (HCC)   OSA (obstructive sleep apnea)   Chronic systolic CHF (congestive heart failure) (HCC)   Gouty arthritis   CKD (chronic kidney disease), stage III (HCC)   PAF (paroxysmal atrial fibrillation) (HCC)  CAP -IV abx -flu/viral panel -treat fevers- tylenol -strep antigen -blood culture  Chronic systolic CHF (EF 81-82) s/p ICD -continue home meds  DM type 2 -SSI -lower dose 70/30  OSA -CPAP  parox a fib -CHADvasc2: 3 -eliquis -coreg/amiodarone  CKD stage III -CR at baseline 1.5  obesity Body mass index is 48.98 kg/m.   Family  Communication/Anticipated D/C date and plan/Code Status   DVT prophylaxis: eliquis Code Status: Full Code.  Family Communication: none at bedside Disposition Plan: home in 24-48 hours   Medical Consultants:    None.     Subjective:   Still c/o body aches  Objective:    Vitals:   08/05/18 0152 08/05/18 0200 08/05/18 0519 08/05/18 0818  BP:  (!) 146/96 (!) 145/100 (!) 146/98  Pulse: 83  83 86  Resp: 20  18   Temp: 99.4 F (37.4 C)  (!) 100.4 F (38 C)   TempSrc: Oral  Oral   SpO2: 95%  96%   Weight: (!) 141.8 kg     Height: 5\' 7"  (1.702 m)       Intake/Output Summary (Last 24 hours) at 08/05/2018 0946 Last data filed at 08/05/2018 0727 Gross per 24 hour  Intake 460 ml  Output 650 ml  Net -190 ml   Filed Weights   08/04/18 1903 08/05/18 0152  Weight: (!) 143.8 kg (!) 141.8 kg    Exam: In bed, ill appearing irr +BS, soft, NT A+Ox3   Data Reviewed:   I have personally reviewed following labs and imaging studies:  Labs: Labs show the following:   Basic Metabolic Panel: Recent Labs  Lab 08/04/18 2129 08/05/18 0346  NA 136 136  K 3.6 3.6  CL 105 107  CO2 22 23  GLUCOSE 116* 121*  BUN 14 14  CREATININE 1.50* 1.49*  CALCIUM 9.4 9.4   GFR Estimated Creatinine Clearance: 87.2 mL/min (A) (by C-G formula based on SCr of 1.49  mg/dL (H)). Liver Function Tests: No results for input(s): AST, ALT, ALKPHOS, BILITOT, PROT, ALBUMIN in the last 168 hours. No results for input(s): LIPASE, AMYLASE in the last 168 hours. No results for input(s): AMMONIA in the last 168 hours. Coagulation profile No results for input(s): INR, PROTIME in the last 168 hours.  CBC: Recent Labs  Lab 08/04/18 2129 08/05/18 0346  WBC 9.6 9.5  NEUTROABS 7.2  --   HGB 16.0 15.2  HCT 49.1 47.6  MCV 86.6 86.1  PLT 224 221   Cardiac Enzymes: No results for input(s): CKTOTAL, CKMB, CKMBINDEX, TROPONINI in the last 168 hours. BNP (last 3 results) No results for input(s):  PROBNP in the last 8760 hours. CBG: Recent Labs  Lab 08/05/18 0815  GLUCAP 147*   D-Dimer: No results for input(s): DDIMER in the last 72 hours. Hgb A1c: No results for input(s): HGBA1C in the last 72 hours. Lipid Profile: No results for input(s): CHOL, HDL, LDLCALC, TRIG, CHOLHDL, LDLDIRECT in the last 72 hours. Thyroid function studies: No results for input(s): TSH, T4TOTAL, T3FREE, THYROIDAB in the last 72 hours.  Invalid input(s): FREET3 Anemia work up: No results for input(s): VITAMINB12, FOLATE, FERRITIN, TIBC, IRON, RETICCTPCT in the last 72 hours. Sepsis Labs: Recent Labs  Lab 08/04/18 2129 08/05/18 0346  WBC 9.6 9.5    Microbiology Recent Results (from the past 240 hour(s))  Respiratory Panel by PCR     Status: None   Collection Time: 08/04/18  7:05 PM  Result Value Ref Range Status   Adenovirus NOT DETECTED NOT DETECTED Final   Coronavirus 229E NOT DETECTED NOT DETECTED Final   Coronavirus HKU1 NOT DETECTED NOT DETECTED Final   Coronavirus NL63 NOT DETECTED NOT DETECTED Final   Coronavirus OC43 NOT DETECTED NOT DETECTED Final   Metapneumovirus NOT DETECTED NOT DETECTED Final   Rhinovirus / Enterovirus NOT DETECTED NOT DETECTED Final   Influenza A NOT DETECTED NOT DETECTED Final   Influenza B NOT DETECTED NOT DETECTED Final   Parainfluenza Virus 1 NOT DETECTED NOT DETECTED Final   Parainfluenza Virus 2 NOT DETECTED NOT DETECTED Final   Parainfluenza Virus 3 NOT DETECTED NOT DETECTED Final   Parainfluenza Virus 4 NOT DETECTED NOT DETECTED Final   Respiratory Syncytial Virus NOT DETECTED NOT DETECTED Final   Bordetella pertussis NOT DETECTED NOT DETECTED Final   Chlamydophila pneumoniae NOT DETECTED NOT DETECTED Final   Mycoplasma pneumoniae NOT DETECTED NOT DETECTED Final    Comment: Performed at [facility], [place] [address], [place],  [address]    Procedures and diagnostic studies:  Dg Chest 2 View  Result Date: 08/04/2018 CLINICAL DATA:   Cough. EXAM: CHEST - 2 VIEW COMPARISON:  Feb 10, 2018 FINDINGS: Left lingular infiltrate.  No other interval changes. IMPRESSION: Left lingular infiltrate, likely pneumonia given history. Recommend follow-up to resolution. Electronically Signed   By: Dorise Bullion III M.D   On: 08/04/2018 20:51    Medications:   . allopurinol  100 mg Oral Daily  . amiodarone  200 mg Oral Daily  . apixaban  5 mg Oral BID  . atorvastatin  80 mg Oral q1800  . [START ON 08/06/2018] azithromycin  500 mg Oral Daily  . carvedilol  3.125 mg Oral BID WC  . gabapentin  300 mg Oral TID  . insulin aspart protamine- aspart  17 Units Subcutaneous BID WC  . isosorbide-hydrALAZINE  1 tablet Oral TID  . potassium chloride  20 mEq Oral Daily  . sacubitril-valsartan  1 tablet Oral BID  . sodium chloride flush  3 mL Intravenous Q12H  . torsemide  40 mg Oral Daily   Continuous Infusions: . cefTRIAXone (ROCEPHIN)  IV       LOS: 0 days   Geradine Girt  [facility]   *Please refer to [email or web address], password [email or web address] to get updated schedule on who will round on this patient, as hospitalists switch teams weekly. If 7PM-7AM, please contact night-coverage at [email or web address], password [email or web address] for any overnight needs.  08/05/2018, 9:46 AM

## 2018-08-05 NOTE — Progress Notes (Signed)
Patient placed on CPAP via nasal mask of 10 cm H2O.  Patient is tolerating at this time. RN made aware.

## 2018-08-05 NOTE — Progress Notes (Signed)
Temperature 101.5 orally, given tylenol 100mg , Dr. Eliseo Squires notified.

## 2018-08-05 NOTE — ED Notes (Signed)
Attempted report x1. 

## 2018-08-05 NOTE — Progress Notes (Signed)
Pt places self on CPAP for the night. 

## 2018-08-06 DIAGNOSIS — J189 Pneumonia, unspecified organism: Secondary | ICD-10-CM

## 2018-08-06 LAB — GLUCOSE, CAPILLARY
GLUCOSE-CAPILLARY: 139 mg/dL — AB (ref 70–99)
Glucose-Capillary: 156 mg/dL — ABNORMAL HIGH (ref 70–99)
Glucose-Capillary: 156 mg/dL — ABNORMAL HIGH (ref 70–99)
Glucose-Capillary: 107 mg/dL — ABNORMAL HIGH (ref 70–99)

## 2018-08-06 MED ORDER — MECLIZINE HCL 25 MG PO TABS
25.0000 mg | ORAL_TABLET | Freq: Three times a day (TID) | ORAL | Status: DC | PRN
  Administered 2018-08-06 – 2018-08-07 (×3): 25 mg via ORAL
  Filled 2018-08-06 (×3): qty 1

## 2018-08-06 NOTE — Progress Notes (Signed)
Progress Note    Jeffrey [name]  ZOX:[id number] DOB: 1976-06-14  DOA: 08/04/2018 PCP: [name], FNP    Brief Narrative:     Medical records reviewed and are as summarized below:  Jeffrey [name] is an 42 y.o. male with medical history significant for chronic systolic CHF (EF 81-19%) s/p ICD, NICM, history of V. tach, paroxysmal atrial fibrillation on Eliquis, insulin-dependent T2DM, renal cell carcinoma status post nephrectomy, HTN, HLD, OSA, CKD, Gout, and obesity who presents with 1 day of sudden onset fevers, chills, diaphoresis, generalized body aches, dyspnea, and cough productive of yellow/greenish sputum.  He had an episode of diarrhea yesterday.  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  He is unaware of any sick contacts.  He says he has been stuck in bed all day due to feeling unwell.  He was concerned about having the flu or pneumonia and therefore presented to the emergency department.  He denies any chest pain, palpitations, peripheral edema, abdominal pain, constipation, or any obvious bleeding.  Assessment/Plan:   Principal Problem:   Lingular pneumonia Active Problems:   Hyperlipidemia associated with type 2 diabetes mellitus ([place])   Hypertension associated with diabetes ([place])   Type 2 diabetes mellitus with stage 3 chronic kidney disease, with long-term current use of insulin (HCC)   OSA (obstructive sleep apnea)   Chronic systolic CHF (congestive heart failure) (HCC)   Gouty arthritis   CKD (chronic kidney disease), stage III (HCC)   PAF (paroxysmal atrial fibrillation) (HCC)  CAP -IV abx -flu/viral panel - neg -treat fevers- tylenol -strep antigen - neg -blood culture - no growth to date  Chronic systolic CHF (EF 14-78) s/p ICD -continue home meds  DM type 2 -SSI -lower dose 70/30  OSA -CPAP  parox a fib -CHADvasc2: 3 -eliquis -coreg/amiodarone  CKD stage III -CR at baseline 1.5  obesity Body mass index  is 48.44 kg/m.   Family Communication/Anticipated D/C date and plan/Code Status   DVT prophylaxis: eliquis Code Status: Full Code.  Family Communication: none at bedside Disposition Plan: home in 24-48 hours   Medical Consultants:    None.     Subjective:   Feeling better but would like to stay one more day due to continued cough -did not know he could ask for cough medicine  Objective:    Vitals:   08/05/18 1359 08/05/18 1659 08/05/18 2127 08/06/18 0626  BP: (!) 130/92 (!) 141/97 135/76 121/81  Pulse: 84 88 72 66  Resp:      Temp: (!) 101.5 F (38.6 C)  99.5 F (37.5 C) 98.3 F (36.8 C)  TempSrc: Oral  Oral Oral  SpO2: 96%  96% 96%  Weight:    (!) 140.3 kg  Height:        Intake/Output Summary (Last 24 hours) at 08/06/2018 1044 Last data filed at 08/06/2018 0700 Gross per 24 hour  Intake 1265.5 ml  Output 1150 ml  Net 115.5 ml   Filed Weights   08/04/18 1903 08/05/18 0152 08/06/18 0626  Weight: (!) 143.8 kg (!) 141.8 kg (!) 140.3 kg    Exam: In bed, ill appearing irr +BS, soft, NT A+Ox3   Data Reviewed:   I have personally reviewed following labs and imaging studies:  Labs: Labs show the following:   Basic Metabolic Panel: Recent Labs  Lab 08/04/18 2129 08/05/18 0346  NA 136 136  K 3.6 3.6  CL 105 107  CO2 22 23  GLUCOSE 116* 121*  BUN 14 14  CREATININE 1.50* 1.49*  CALCIUM 9.4 9.4   GFR Estimated Creatinine Clearance: 86.6 mL/min (A) (by C-G formula based on SCr of 1.49 mg/dL (H)). Liver Function Tests: No results for input(s): AST, ALT, ALKPHOS, BILITOT, PROT, ALBUMIN in the last 168 hours. No results for input(s): LIPASE, AMYLASE in the last 168 hours. No results for input(s): AMMONIA in the last 168 hours. Coagulation profile No results for input(s): INR, PROTIME in the last 168 hours.  CBC: Recent Labs  Lab 08/04/18 2129 08/05/18 0346  WBC 9.6 9.5  NEUTROABS 7.2  --   HGB 16.0 15.2  HCT 49.1 47.6  MCV 86.6 86.1    PLT 224 221   Cardiac Enzymes: No results for input(s): CKTOTAL, CKMB, CKMBINDEX, TROPONINI in the last 168 hours. BNP (last 3 results) No results for input(s): PROBNP in the last 8760 hours. CBG: Recent Labs  Lab 08/05/18 0815 08/05/18 1200 08/05/18 1636 08/05/18 2124 08/06/18 0723  GLUCAP 147* 127* 110* 109* 139*   D-Dimer: No results for input(s): DDIMER in the last 72 hours. Hgb A1c: No results for input(s): HGBA1C in the last 72 hours. Lipid Profile: No results for input(s): CHOL, HDL, LDLCALC, TRIG, CHOLHDL, LDLDIRECT in the last 72 hours. Thyroid function studies: No results for input(s): TSH, T4TOTAL, T3FREE, THYROIDAB in the last 72 hours.  Invalid input(s): FREET3 Anemia work up: No results for input(s): VITAMINB12, FOLATE, FERRITIN, TIBC, IRON, RETICCTPCT in the last 72 hours. Sepsis Labs: Recent Labs  Lab 08/04/18 2129 08/05/18 0346  WBC 9.6 9.5    Microbiology Recent Results (from the past 240 hour(s))  Respiratory Panel by PCR     Status: None   Collection Time: 08/04/18  7:05 PM  Result Value Ref Range Status   Adenovirus NOT DETECTED NOT DETECTED Final   Coronavirus 229E NOT DETECTED NOT DETECTED Final   Coronavirus HKU1 NOT DETECTED NOT DETECTED Final   Coronavirus NL63 NOT DETECTED NOT DETECTED Final   Coronavirus OC43 NOT DETECTED NOT DETECTED Final   Metapneumovirus NOT DETECTED NOT DETECTED Final   Rhinovirus / Enterovirus NOT DETECTED NOT DETECTED Final   Influenza A NOT DETECTED NOT DETECTED Final   Influenza B NOT DETECTED NOT DETECTED Final   Parainfluenza Virus 1 NOT DETECTED NOT DETECTED Final   Parainfluenza Virus 2 NOT DETECTED NOT DETECTED Final   Parainfluenza Virus 3 NOT DETECTED NOT DETECTED Final   Parainfluenza Virus 4 NOT DETECTED NOT DETECTED Final   Respiratory Syncytial Virus NOT DETECTED NOT DETECTED Final   Bordetella pertussis NOT DETECTED NOT DETECTED Final   Chlamydophila pneumoniae NOT DETECTED NOT DETECTED Final    Mycoplasma pneumoniae NOT DETECTED NOT DETECTED Final    Comment: Performed at [facility], [place] [address], [place], [place] [address]  Culture, blood (routine x 2) Call MD if unable to obtain prior to antibiotics being given     Status: None (Preliminary result)   Collection Time: 08/05/18  8:42 AM  Result Value Ref Range Status   Specimen Description BLOOD RIGHT ANTECUBITAL  Final   Special Requests   Final    BOTTLES DRAWN AEROBIC AND ANAEROBIC Blood Culture adequate volume   Culture   Final    NO GROWTH < 24 HOURS Performed at [facility], [address] [place], [place] [address]    Report Status PENDING  Incomplete  Culture, blood (routine x 2) Call MD if unable to obtain prior to antibiotics being  given     Status: None (Preliminary result)   Collection Time: 08/05/18  8:46 AM  Result Value Ref Range Status   Specimen Description BLOOD BLOOD RIGHT HAND  Final   Special Requests   Final    BOTTLES DRAWN AEROBIC ONLY Blood Culture adequate volume   Culture   Final    NO GROWTH < 24 HOURS Performed at [facility], [address], [place], [place] [address]    Report Status PENDING  Incomplete  Culture, sputum-assessment     Status: None   Collection Time: 08/05/18 12:14 PM  Result Value Ref Range Status   Specimen Description EXPECTORATED SPUTUM  Final   Special Requests NONE  Final   Sputum evaluation   Final    THIS SPECIMEN IS ACCEPTABLE FOR SPUTUM CULTURE Performed at [facility], [address], [place], [place] [address]    Report Status 08/05/2018 FINAL  Final  Culture, respiratory     Status: None (Preliminary result)   Collection Time: 08/05/18 12:14 PM  Result Value Ref Range Status   Specimen Description EXPECTORATED SPUTUM  Final   Special Requests NONE Reflexed from [id number]  Final   Gram Stain   Final    RARE WBC PRESENT, PREDOMINANTLY PMN RARE GRAM POSITIVE COCCI    Culture   Final    CULTURE REINCUBATED FOR BETTER  GROWTH Performed at [facility], [place] [address], [place], [place] [address]    Report Status PENDING  Incomplete    Procedures and diagnostic studies:  Dg Chest 2 View  Result Date: 08/04/2018 CLINICAL DATA:  Cough. EXAM: CHEST - 2 VIEW COMPARISON:  Feb 10, 2018 FINDINGS: Left lingular infiltrate.  No other interval changes. IMPRESSION: Left lingular infiltrate, likely pneumonia given history. Recommend follow-up to resolution. Electronically Signed   By: Dorise Bullion III M.D   On: 08/04/2018 20:51    Medications:   . allopurinol  100 mg Oral Daily  . amiodarone  200 mg Oral Daily  . apixaban  5 mg Oral BID  . atorvastatin  80 mg Oral q1800  . azithromycin  500 mg Oral Daily  . carvedilol  3.125 mg Oral BID WC  . gabapentin  300 mg Oral TID  . insulin aspart protamine- aspart  17 Units Subcutaneous BID WC  . isosorbide-hydrALAZINE  1 tablet Oral TID  . potassium chloride  20 mEq Oral Daily  . sacubitril-valsartan  1 tablet Oral BID  . sodium chloride flush  3 mL Intravenous Q12H  . torsemide  40 mg Oral Daily   Continuous Infusions: . cefTRIAXone (ROCEPHIN)  IV Stopped (08/05/18 2249)     LOS: 1 day   Benito Mccreedy  [facility]   *Please refer to [email or web address], password [email or web address] to get updated schedule on who will round on this patient, as hospitalists switch teams weekly. If 7PM-7AM, please contact night-coverage at [email or web address], password [email or web address] for any overnight needs.  08/06/2018, 10:44 AM

## 2018-08-07 LAB — GLUCOSE, CAPILLARY: Glucose-Capillary: 115 mg/dL — ABNORMAL HIGH (ref 70–99)

## 2018-08-07 MED ORDER — AMOXICILLIN-POT CLAVULANATE 875-125 MG PO TABS: 1.0000 | ORAL_TABLET | Freq: Two times a day (BID) | ORAL | 0 refills | Status: DC

## 2018-08-07 MED ORDER — AZITHROMYCIN 250 MG PO TABS: ORAL_TABLET | ORAL | 0 refills | Status: DC

## 2018-08-07 MED ORDER — AMOXICILLIN-POT CLAVULANATE 875-125 MG PO TABS
1.0000 | ORAL_TABLET | Freq: Two times a day (BID) | ORAL | Status: DC
  Administered 2018-08-07: 1 via ORAL
  Filled 2018-08-07: qty 1

## 2018-08-07 MED ORDER — GUAIFENESIN-DM 100-10 MG/5ML PO SYRP: 5.0000 mL | ORAL_SOLUTION | ORAL | 0 refills | Status: DC | PRN

## 2018-08-07 NOTE — Discharge Summary (Signed)
Jeffrey [name], is a 42 y.o. male  DOB Sep 18, 1976  MRN [id number].  Admission date:  08/04/2018  Admitting Physician  Lenore Cordia, MD  Discharge Date:  08/07/2018   Primary MD  [name], FNP  Recommendations for primary care physician for things to follow:  CBC  CMP  repeat chest x-ray in 3-4 weeks a   Admission Diagnosis  Community acquired pneumonia of left lower lobe of lung ([place]) [J18.1]   Discharge Diagnosis  Community acquired pneumonia of left lower lobe of lung ([place]) [J18.1]    Principal Problem:   Lingular pneumonia Active Problems:   Hyperlipidemia associated with type 2 diabetes mellitus ([place])   Hypertension associated with diabetes ([place])   Type 2 diabetes mellitus with stage 3 chronic kidney disease, with long-term current use of insulin (HCC)   OSA (obstructive sleep apnea)   Chronic systolic CHF (congestive heart failure) (HCC)   Gouty arthritis   CKD (chronic kidney disease), stage III (HCC)   PAF (paroxysmal atrial fibrillation) ([place])      Past Medical History:  Diagnosis Date  . Anxiety   . Aspirin allergy   . Childhood asthma   . Chronic systolic CHF (congestive heart failure) ([place])    a. EF 20-25% in 2012. b. EF 45-50% in 10/2011 with nonischemic nuc - presumed NICM. c. 12/2014 Echo: Sev depressed LV fxn, sev dil LV, mild LVH, mild MR, sev dil LA, mildly reduced RV fxn.  . CKD (chronic kidney disease) stage 2, GFR 60-89 ml/min   . High cholesterol   . Hypertension   . Morbid obesity ([place])   . Nephrolithiasis   . OSA on CPAP   . Presumed NICM    a. 04/2014 Myoview: EF 26%, glob HK, sev glob HK, ? prior infarct;  b. Never cathed 2/2 CKD.  [name] Renal cell carcinoma ([place])    a. s/p Rt robotic assisted partial converted to radical nephrectomy on 01/2013.  . Troponin level elevated    a. 04/2014, 12/2014: felt due to CHF.  . Type II diabetes mellitus ([place])   .  Ventricular tachycardia ([place])    a. appropriate ICD therapy 12/2017    Past Surgical History:  Procedure Laterality Date  . APPENDECTOMY  07/2004  . CARDIAC CATHETERIZATION N/A 05/17/2015   Procedure: Right/Left Heart Cath and Coronary Angiography;  Surgeon: Jolaine Artist, MD;  Location: [facility];  Service: Cardiovascular;  Laterality: N/A;  . EP IMPLANTABLE DEVICE N/A 05/17/2015   Procedure: SubQ ICD Implant;  Surgeon: Will Meredith Leeds, MD;  Location: [facility];  Service: Cardiovascular;  Laterality: N/A;  . ROBOTIC ASSISTED LAPAROSCOPIC LYSIS OF ADHESION  01/13/2013   Procedure: ROBOTIC ASSISTED LAPAROSCOPIC LYSIS OF ADHESION EXTENSIVE;  Surgeon: Alexis Frock, MD;  Location: [facility];  Service: Urology;;  . ROBOTIC ASSITED PARTIAL NEPHRECTOMY Right 01/13/2013   Procedure: ROBOTIC ASSITED PARTIAL NEPHRECTOMY CONVERTED TO ROBOTIC ASSISTED RIGHT RADICAL NEPHRECTOMY;  Surgeon: Alexis Frock, MD;  Location: [facility];  Service: Urology;  Laterality: Right;       HPI  from the history and physical done on the day of admission:    Jeffrey [name] is a 42 y.o. male with medical history significant for chronic systolic CHF (EF 49-70%) s/p ICD, NICM, history of V. tach, paroxysmal atrial fibrillation on Eliquis, insulin-dependent T2DM, renal cell carcinoma status post nephrectomy, HTN, HLD, OSA, CKD, Gout, and obesity who presents with 1 day of sudden onset fevers, chills, diaphoresis, generalized body aches, dyspnea, and cough productive of yellow/greenish sputum.  He had an episode of diarrhea yesterday.  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  He is unaware of any sick contacts.  He says he has been stuck in bed all day due to feeling unwell.  He was concerned about having the flu or pneumonia and therefore presented to the emergency department.  He denies any chest pain, palpitations, peripheral edema, abdominal pain, constipation, or any obvious  bleeding.  ED Course:  BP 134/96, pulse 82, RR 20, temp 101.69F, SPO2 98% on room air. CBC was within normal limits. BMP showed creatinine 1.5 which appears to be his recent baseline. Influenza panel was negative. Chest x-ray showed a left lingular infiltrate. He was started on ceftriaxone and azithromycin in the hospital service was consulted for potential admission due to patient's multiple comorbidities.     Hospital Course:  Jeffrey [name] is an 42 y.o. male with medical history significantfor chronic systolic CHF(EF 26-37%)C/HYIF,OYDX,AJOINOM of V. tach, paroxysmal atrial fibrillation on Eliquis, insulin-dependent T2DM,renal cell carcinoma status post nephrectomy,HTN, HLD, OSA, CKD, Gout, and obesitywho presents with 1 day of sudden onset fevers, chills, diaphoresis, generalized body aches, dyspnea, and cough productive of yellow/greenish sputum. He had an episode of diarrhea yesterday. 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 He is unaware of any sick contacts. He says he has been stuck in bed all day due to feeling unwell. He was concerned about having the flu or pneumonia and therefore presented to the emergency department. He denies any chest pain, palpitations, peripheral edema, abdominal pain, constipation, or any obvious bleeding    CAP -IV abx -flu/viral panel - neg -treat fevers- tylenol -strep antigen - neg -blood culture - no growth to date  Chronic systolic CHF (EF 76-72) s/p ICD -continue home meds  DM type 2 -SSI -lower dose 70/30  OSA -CPAP  parox a fib -CHADvasc2: 3 -eliquis -coreg/amiodarone  CKD stage III -CR at baseline 1.5  obesity Body mass index is 48.44 kg/m  Discharge Condition:  Improved and satisfactory a  Follow UP     Consults obtained -  Not applicable  Diet and Activity recommendation:  As advised  Discharge Instructions     Discharge Instructions    Call MD for:   difficulty breathing, headache or visual disturbances   Complete by:  As directed    Call MD for:  extreme fatigue   Complete by:  As directed    Call MD for:  hives   Complete by:  As directed    Call MD for:  persistant dizziness or light-headedness   Complete by:  As directed    Call MD for:  persistant nausea and vomiting   Complete by:  As directed    Call MD for:  redness, tenderness, or signs of infection (pain, swelling, redness, odor or green/yellow discharge around incision site)   Complete by:  As directed    Call MD for:  severe  uncontrolled pain   Complete by:  As directed    Call MD for:  temperature >100.4   Complete by:  As directed    Diet - low sodium heart healthy   Complete by:  As directed    Increase activity slowly   Complete by:  As directed         Discharge Medications     Allergies as of 08/07/2018      Reactions   Aspirin Shortness Of Breath, Itching, Rash    Burning sensation (Patient reports he tolerates other NSAIDS)    Bee Venom Hives, Swelling   Lisinopril Cough   Tomato Rash      Medication List    TAKE these medications   allopurinol 100 MG tablet Commonly known as:  ZYLOPRIM Take 1 tablet (100 mg total) by mouth daily.   amiodarone 200 MG tablet Commonly known as:  PACERONE Take 200 mg by mouth daily.   amoxicillin-clavulanate 875-125 MG tablet Commonly known as:  AUGMENTIN Take 1 tablet by mouth every 12 (twelve) hours.   apixaban 5 MG Tabs tablet Commonly known as:  ELIQUIS Take 1 tablet (5 mg total) by mouth 2 (two) times daily.   atorvastatin 80 MG tablet Commonly known as:  LIPITOR TAKE 1 TABLET (80 MG TOTAL) BY MOUTH EVERY EVENING. What changed:    how much to take  how to take this  when to take this  additional instructions   azithromycin 250 MG tablet Commonly known as:  ZITHROMAX Daily for 3 more days and stop Start taking on:  08/08/2018   carvedilol 3.125 MG tablet Commonly known as:  COREG Take 1  tablet (3.125 mg total) by mouth 2 (two) times daily.   gabapentin 300 MG capsule Commonly known as:  NEURONTIN TAKE 1 CAPSULE BY MOUTH 3 TIMES DAILY.   guaiFENesin-dextromethorphan 100-10 MG/5ML syrup Commonly known as:  ROBITUSSIN DM Take 5 mLs by mouth every 4 (four) hours as needed for cough (chest congestion).   insulin NPH-regular Human (70-30) 100 UNIT/ML injection Inject 34 Units into the skin 2 (two) times daily with a meal.   isosorbide-hydrALAZINE 20-37.5 MG tablet Commonly known as:  BIDIL Take 1 tablet by mouth 3 (three) times daily.   Magnesium Oxide 200 MG Tabs Take 100 mg by mouth daily.   meclizine 25 MG tablet Commonly known as:  ANTIVERT Take 1 tablet (25 mg total) by mouth 3 (three) times daily as needed for dizziness (Take meclizine for spinning dizziness. You may also take Valium if your symptoms are not relieved by the meclizine alone.).   MITIGARE 0.6 MG Caps Generic drug:  Colchicine Take 1 tablet by mouth 2 (two) times daily. What changed:  Another medication with the same name was removed. Continue taking this medication, and follow the directions you see here.   potassium chloride 10 MEQ tablet Commonly known as:  K-DUR Take 2 tablets (20 mEq total) by mouth daily.   sacubitril-valsartan 97-103 MG Commonly known as:  ENTRESTO Take 1 tablet by mouth 2 (two) times daily.   torsemide 20 MG tablet Commonly known as:  DEMADEX Take 2 tablets (40 mg total) by mouth daily.   traMADol 50 MG tablet Commonly known as:  ULTRAM Take 1 tablet (50 mg total) by mouth every 6 (six) hours as needed.       Major procedures and Radiology Reports - PLEASE review detailed and final reports for all details, in brief -      Dg  Chest 2 View  Result Date: 08/04/2018 CLINICAL DATA:  Cough. EXAM: CHEST - 2 VIEW COMPARISON:  Feb 10, 2018 FINDINGS: Left lingular infiltrate.  No other interval changes. IMPRESSION: Left lingular infiltrate, likely pneumonia given  history. Recommend follow-up to resolution. Electronically Signed   By: Dorise Bullion III M.D   On: 08/04/2018 20:51    Micro Results    Recent Results (from the past 240 hour(s))  Respiratory Panel by PCR     Status: None   Collection Time: 08/04/18  7:05 PM  Result Value Ref Range Status   Adenovirus NOT DETECTED NOT DETECTED Final   Coronavirus 229E NOT DETECTED NOT DETECTED Final   Coronavirus HKU1 NOT DETECTED NOT DETECTED Final   Coronavirus NL63 NOT DETECTED NOT DETECTED Final   Coronavirus OC43 NOT DETECTED NOT DETECTED Final   Metapneumovirus NOT DETECTED NOT DETECTED Final   Rhinovirus / Enterovirus NOT DETECTED NOT DETECTED Final   Influenza A NOT DETECTED NOT DETECTED Final   Influenza B NOT DETECTED NOT DETECTED Final   Parainfluenza Virus 1 NOT DETECTED NOT DETECTED Final   Parainfluenza Virus 2 NOT DETECTED NOT DETECTED Final   Parainfluenza Virus 3 NOT DETECTED NOT DETECTED Final   Parainfluenza Virus 4 NOT DETECTED NOT DETECTED Final   Respiratory Syncytial Virus NOT DETECTED NOT DETECTED Final   Bordetella pertussis NOT DETECTED NOT DETECTED Final   Chlamydophila pneumoniae NOT DETECTED NOT DETECTED Final   Mycoplasma pneumoniae NOT DETECTED NOT DETECTED Final    Comment: Performed at [facility], [address], [place], [place] [address]  Culture, blood (routine x 2) Call MD if unable to obtain prior to antibiotics being given     Status: None (Preliminary result)   Collection Time: 08/05/18  8:42 AM  Result Value Ref Range Status   Specimen Description BLOOD RIGHT ANTECUBITAL  Final   Special Requests   Final    BOTTLES DRAWN AEROBIC AND ANAEROBIC Blood Culture adequate volume   Culture   Final    NO GROWTH 1 DAY Performed at [facility], [place] [address], [place], [place] [address]    Report Status PENDING  Incomplete  Culture, blood (routine x 2) Call MD if unable to obtain prior to antibiotics being given     Status: None (Preliminary  result)   Collection Time: 08/05/18  8:46 AM  Result Value Ref Range Status   Specimen Description BLOOD BLOOD RIGHT HAND  Final   Special Requests   Final    BOTTLES DRAWN AEROBIC ONLY Blood Culture adequate volume   Culture   Final    NO GROWTH 1 DAY Performed at [facility], [address] [place], [place] [address]    Report Status PENDING  Incomplete  Culture, sputum-assessment     Status: None   Collection Time: 08/05/18 12:14 PM  Result Value Ref Range Status   Specimen Description EXPECTORATED SPUTUM  Final   Special Requests NONE  Final   Sputum evaluation   Final    THIS SPECIMEN IS ACCEPTABLE FOR SPUTUM CULTURE Performed at [facility], [address] [place], [place] [address]    Report Status 08/05/2018 FINAL  Final  Culture, respiratory     Status: None (Preliminary result)   Collection Time: 08/05/18 12:14 PM  Result Value Ref Range Status   Specimen Description EXPECTORATED SPUTUM  Final   Special Requests NONE Reflexed from [id number]  Final   Gram Stain   Final  RARE WBC PRESENT, PREDOMINANTLY PMN RARE GRAM POSITIVE COCCI    Culture   Final    CULTURE REINCUBATED FOR BETTER GROWTH Performed at [facility], [place] [address], [place], [place] [address]    Report Status PENDING  Incomplete       Today   Subjective    Jeffrey [name] today has no  Chest pain, no fever or chills.  Shortness of breath is about resolved.          Patient has been seen and examined prior to discharge   Objective   Blood pressure 115/80, pulse 64, temperature 97.7 F (36.5 C), temperature source Oral, resp. rate 18, height 5\' 7"  (1.702 m), weight (!) 140.4 kg, SpO2 96 %.   Intake/Output Summary (Last 24 hours) at 08/07/2018 1013 Last data filed at 08/07/2018 0558 Gross per 24 hour  Intake 360 ml  Output 2200 ml  Net -1840 ml    Exam Gen:- Awake , NAD  HEENT:- [place].AT,  MMM Neck-Supple Neck,No JVD,  Lungs- mostly clear  CV- S1, S2 normal Abd-  +ve  B.Sounds, Abd Soft, No tenderness,    Extremity/Skin:- Intact peripheral pulses     Data Review   CBC w Diff:  Lab Results  Component Value Date   WBC 9.5 08/05/2018   HGB 15.2 08/05/2018   HCT 47.6 08/05/2018   PLT 221 08/05/2018   LYMPHOPCT 15 08/04/2018   MONOPCT 8 08/04/2018   EOSPCT 1 08/04/2018   BASOPCT 1 08/04/2018    CMP:  Lab Results  Component Value Date   NA 136 08/05/2018   NA 138 11/01/2017   K 3.6 08/05/2018   CL 107 08/05/2018   CO2 23 08/05/2018   BUN 14 08/05/2018   BUN 24 11/01/2017   CREATININE 1.49 (H) 08/05/2018   PROT 7.3 11/01/2017   ALBUMIN 4.0 11/01/2017   BILITOT 0.4 11/01/2017   ALKPHOS 65 11/01/2017   AST 21 11/01/2017   ALT 30 11/01/2017  .   Total Discharge time is about 33 minutes  Benito Mccreedy M.D on 08/07/2018 at 10:13 AM  [facility]   Office  [phone]  Dragon dictation system was used to create this note, attempts have been made to correct errors, however presence of uncorrected errors is not a reflection quality of care provided

## 2018-08-08 LAB — CULTURE, RESPIRATORY

## 2018-08-08 LAB — CULTURE, RESPIRATORY W GRAM STAIN: Culture: NORMAL

## 2018-08-08 MED FILL — TORSEMIDE 20 MG TABLET: 20 | 30 days supply | Qty: 60 | Fill #2

## 2018-08-08 MED FILL — MECLIZINE 25 MG TABLET: 25 | 10 days supply | Qty: 30 | Fill #2

## 2018-08-08 MED FILL — AMIODARONE HCL 200 MG TAB: 200 | 30 days supply | Qty: 30 | Fill #5

## 2018-08-08 MED FILL — traMADol HCL 50 MG TABS: 50 | 7 days supply | Qty: 30 | Fill #0

## 2018-08-08 MED FILL — AMOX-CLAV 875-125 MG TABLET: 875-125 | 7 days supply | Qty: 14 | Fill #0

## 2018-08-08 MED FILL — AZITHROMYCIN 250 MG TABLET: 250 | 3 days supply | Qty: 3 | Fill #0

## 2018-08-08 MED FILL — ALLOPURINOL 100 MG TABLET: 100 | 30 days supply | Qty: 60 | Fill #5

## 2018-08-08 MED FILL — CARVEDILOL 3.125 MG TABLET: 3.125 | 30 days supply | Qty: 60 | Fill #1

## 2018-08-09 ENCOUNTER — Telehealth (HOSPITAL_COMMUNITY): Payer: Self-pay | Admitting: Pharmacist

## 2018-08-09 DIAGNOSIS — G4733 Obstructive sleep apnea (adult) (pediatric): Secondary | ICD-10-CM | POA: Diagnosis not present

## 2018-08-09 NOTE — Telephone Encounter (Signed)
Entresto PA approved by [facility] Medicaid through 08/09/2019

## 2018-08-10 LAB — CULTURE, BLOOD (ROUTINE X 2)
CULTURE: NO GROWTH
Culture: NO GROWTH
Special Requests: ADEQUATE
Special Requests: ADEQUATE

## 2018-08-10 MED FILL — VIT D2 1.25 MG (50,000 UNIT: 1.25 MG | 28 days supply | Qty: 4 | Fill #2

## 2018-08-16 ENCOUNTER — Ambulatory Visit (INDEPENDENT_AMBULATORY_CARE_PROVIDER_SITE_OTHER): Payer: Medicaid Other | Admitting: Family Medicine

## 2018-08-16 VITALS — BP 133/90 | HR 72 | Temp 97.7°F | Resp 16 | Ht 67.0 in | Wt 322.0 lb

## 2018-08-16 DIAGNOSIS — J189 Pneumonia, unspecified organism: Secondary | ICD-10-CM | POA: Diagnosis not present

## 2018-08-16 DIAGNOSIS — N183 Chronic kidney disease, stage 3 unspecified: Secondary | ICD-10-CM

## 2018-08-16 DIAGNOSIS — M545 Low back pain, unspecified: Secondary | ICD-10-CM

## 2018-08-16 DIAGNOSIS — I10 Essential (primary) hypertension: Secondary | ICD-10-CM

## 2018-08-16 DIAGNOSIS — Z09 Encounter for follow-up examination after completed treatment for conditions other than malignant neoplasm: Secondary | ICD-10-CM | POA: Diagnosis not present

## 2018-08-16 DIAGNOSIS — Z794 Long term (current) use of insulin: Secondary | ICD-10-CM | POA: Diagnosis not present

## 2018-08-16 DIAGNOSIS — R42 Dizziness and giddiness: Secondary | ICD-10-CM | POA: Diagnosis not present

## 2018-08-16 DIAGNOSIS — E1122 Type 2 diabetes mellitus with diabetic chronic kidney disease: Secondary | ICD-10-CM | POA: Diagnosis not present

## 2018-08-16 MED ORDER — SACUBITRIL-VALSARTAN 97-103 MG PO TABS: 1.0000 | ORAL_TABLET | Freq: Two times a day (BID) | ORAL | 3 refills | Status: DC

## 2018-08-16 MED ORDER — APIXABAN 5 MG PO TABS: 5.0000 mg | ORAL_TABLET | Freq: Two times a day (BID) | ORAL | 10 refills | Status: DC

## 2018-08-16 MED FILL — ENTRESTO 97 MG-103 MG TAB: 97-103 | 30 days supply | Qty: 60 | Fill #0

## 2018-08-16 MED FILL — ELIQUIS 5 MG TABLET: 5 | 30 days supply | Qty: 60 | Fill #0

## 2018-08-16 NOTE — Progress Notes (Signed)
Hospital Follow Up  Subjective:    Patient ID: Jeffrey [name], male    DOB: 1976/08/01, 42 y.o.   MRN: [id number]   Chief Complaint  Patient presents with  . Hospitalization Follow-up    pneumonia follow up     HPI  Jeffrey [name] is a 42 year old male with a past medical history of Ventricular Tachycardia, Diabetes, Renal Cell Carcinoma, Nephrolithiasis, Morbid Obesity, Hypertension, Hyperlipidemia, CKD, Asthma, Aspirin, and Anxiety. He is here today for Hospital Follow Up.    Current Status: Since his last office visit, he is doing well with no complaints. S/p: CAP and hospitalized from 08/04/2018-08/07/2018. No chest pain, heart palpitations, cough and shortness of breath reported.   He denies fevers, chills, recent infections, weight loss, and night sweats. No reports of GI problems such as diarrhea, and constipation. He has no reports of blood in stools, dysuria and hematuria. No depression or anxiety, and denies suicidal ideations, homicidal ideations, or auditory hallucinations. He denies pain today.   Review of Systems  Constitutional: Negative.   HENT: Negative.   Eyes: Negative.   Respiratory: Negative.   Cardiovascular: Negative.   Gastrointestinal: Negative.   Genitourinary: Negative.   Musculoskeletal: Negative.   Skin: Negative.   Neurological: Positive for dizziness (Occasional ).  Hematological: Negative.   Psychiatric/Behavioral: Negative.    Objective:   Physical Exam  Constitutional: He appears well-developed and well-nourished.  HENT:  Head: Normocephalic and atraumatic.  Eyes: Pupils are equal, round, and reactive to light. Conjunctivae and EOM are normal.  Neck: Normal range of motion. Neck supple.  Cardiovascular: Normal rate, regular rhythm, normal heart sounds and intact distal pulses.  Pulmonary/Chest: Effort normal and breath sounds normal.  Abdominal: Soft. Bowel sounds are normal. He exhibits distension (Obese).  Musculoskeletal: Normal range of motion.   Neurological: He is alert.  Skin: Skin is warm and dry.  Psychiatric: He has a normal mood and affect. His behavior is normal. Judgment and thought content normal.  Nursing note and vitals reviewed.  Assessment & Plan:   1. Hospital discharge follow-up  2. Community acquired pneumonia, unspecified laterality Resolved.   3. Essential hypertension He will continue Amiodarone, Isosorbide, Entresto, and Demedex as prescribed. She will continue to decrease high sodium intake, excessive alcohol intake, increase potassium intake, smoking cessation, and increase physical activity of at least 30 minutes of cardio activity daily. She will continue to follow Heart Healthy or DASH diet. - apixaban (ELIQUIS) 5 MG TABS tablet; Take 1 tablet (5 mg total) by mouth 2 (two) times daily.  Dispense: 60 tablet; Refill: 10 - sacubitril-valsartan (ENTRESTO) 97-103 MG; Take 1 tablet by mouth 2 (two) times daily.  Dispense: 180 tablet; Refill: 3  4. Type 2 diabetes mellitus with stage 3 chronic kidney disease, with long-term current use of insulin (HCC) Hgb A1c stable at 6.1 on 07/25/2018. Continue medications as prescribed. He will continue to decrease foods/beverages high in sugars and carbs and follow Heart Healthy or DASH diet. Increase physical activity to at least 30 minutes cardio exercise daily.   5. Low back pain without sciatica, unspecified back pain laterality, unspecified chronicity  6. Dizziness Stable today. Continue Meclizine as needed.   7. Follow up He will follow up in 3 months.   Meds ordered this encounter  Medications  . apixaban (ELIQUIS) 5 MG TABS tablet    Sig: Take 1 tablet (5 mg total) by mouth 2 (two) times daily.    Dispense:  60 tablet  Refill:  10  . DISCONTD: sacubitril-valsartan (ENTRESTO) 97-103 MG    Sig: Take 1 tablet by mouth 2 (two) times daily.    Dispense:  180 tablet    Refill:  3    Please cancel all previous orders for current medication. Change in dosage  or pill size.  . sacubitril-valsartan (ENTRESTO) 97-103 MG    Sig: Take 1 tablet by mouth 2 (two) times daily.    Dispense:  180 tablet    Refill:  3    Please cancel all previous orders for current medication. Change in dosage or pill size.    Kathe Becton,  MSN, FNP-C [facility] [address] [place], [place] [address] [phone]

## 2018-09-01 ENCOUNTER — Emergency Department (HOSPITAL_COMMUNITY)
Admission: EM | Admit: 2018-09-01 | Discharge: 2018-09-01 | Disposition: A | Discharge status: Home / Self Care | Payer: Medicaid Other | Attending: Emergency Medicine | Admitting: Emergency Medicine

## 2018-09-01 ENCOUNTER — Other Ambulatory Visit: Payer: Self-pay

## 2018-09-01 ENCOUNTER — Emergency Department (HOSPITAL_COMMUNITY): Payer: Medicaid Other

## 2018-09-01 ENCOUNTER — Encounter (HOSPITAL_COMMUNITY): Payer: Self-pay

## 2018-09-01 DIAGNOSIS — M7918 Myalgia, other site: Secondary | ICD-10-CM | POA: Insufficient documentation

## 2018-09-01 DIAGNOSIS — J029 Acute pharyngitis, unspecified: Secondary | ICD-10-CM | POA: Diagnosis present

## 2018-09-01 DIAGNOSIS — E1122 Type 2 diabetes mellitus with diabetic chronic kidney disease: Secondary | ICD-10-CM | POA: Insufficient documentation

## 2018-09-01 DIAGNOSIS — Z87891 Personal history of nicotine dependence: Secondary | ICD-10-CM | POA: Insufficient documentation

## 2018-09-01 DIAGNOSIS — N182 Chronic kidney disease, stage 2 (mild): Secondary | ICD-10-CM | POA: Insufficient documentation

## 2018-09-01 DIAGNOSIS — J351 Hypertrophy of tonsils: Secondary | ICD-10-CM | POA: Diagnosis not present

## 2018-09-01 DIAGNOSIS — I130 Hypertensive heart and chronic kidney disease with heart failure and stage 1 through stage 4 chronic kidney disease, or unspecified chronic kidney disease: Secondary | ICD-10-CM | POA: Insufficient documentation

## 2018-09-01 DIAGNOSIS — Z79899 Other long term (current) drug therapy: Secondary | ICD-10-CM | POA: Insufficient documentation

## 2018-09-01 DIAGNOSIS — Z794 Long term (current) use of insulin: Secondary | ICD-10-CM | POA: Diagnosis not present

## 2018-09-01 DIAGNOSIS — J020 Streptococcal pharyngitis: Secondary | ICD-10-CM | POA: Insufficient documentation

## 2018-09-01 DIAGNOSIS — I5032 Chronic diastolic (congestive) heart failure: Secondary | ICD-10-CM | POA: Diagnosis not present

## 2018-09-01 LAB — CBC WITH DIFFERENTIAL/PLATELET
Abs Immature Granulocytes: 0.07 10*3/uL (ref 0.00–0.07)
Basophils Absolute: 0.1 10*3/uL (ref 0.0–0.1)
Basophils Relative: 1 %
EOS ABS: 0 10*3/uL (ref 0.0–0.5)
Eosinophils Relative: 0 %
HCT: 44.7 % (ref 39.0–52.0)
Hemoglobin: 14.7 g/dL (ref 13.0–17.0)
Immature Granulocytes: 1 %
LYMPHS ABS: 1.6 10*3/uL (ref 0.7–4.0)
Lymphocytes Relative: 11 %
MCH: 28.4 pg (ref 26.0–34.0)
MCHC: 32.9 g/dL (ref 30.0–36.0)
MCV: 86.5 fL (ref 80.0–100.0)
MONOS PCT: 8 %
Monocytes Absolute: 1.2 10*3/uL — ABNORMAL HIGH (ref 0.1–1.0)
Neutro Abs: 11.4 10*3/uL — ABNORMAL HIGH (ref 1.7–7.7)
Neutrophils Relative %: 79 %
Platelets: 210 10*3/uL (ref 150–400)
RBC: 5.17 MIL/uL (ref 4.22–5.81)
RDW: 16.1 % — ABNORMAL HIGH (ref 11.5–15.5)
WBC: 14.4 10*3/uL — ABNORMAL HIGH (ref 4.0–10.5)
nRBC: 0 % (ref 0.0–0.2)

## 2018-09-01 LAB — GROUP A STREP BY PCR: Group A Strep by PCR: DETECTED — AB

## 2018-09-01 LAB — INFLUENZA PANEL BY PCR (TYPE A & B)
INFLBPCR: NEGATIVE
Influenza A By PCR: NEGATIVE

## 2018-09-01 LAB — BASIC METABOLIC PANEL
Anion gap: 6 (ref 5–15)
BUN: 14 mg/dL (ref 6–20)
CO2: 27 mmol/L (ref 22–32)
Calcium: 9 mg/dL (ref 8.9–10.3)
Chloride: 107 mmol/L (ref 98–111)
Creatinine, Ser: 1.44 mg/dL — ABNORMAL HIGH (ref 0.61–1.24)
GFR calc Af Amer: >60 mL/min (ref >60)
GFR calc non Af Amer: 59 mL/min — ABNORMAL LOW (ref >60)
Glucose, Bld: 118 mg/dL — ABNORMAL HIGH (ref 70–99)
Potassium: 3.6 mmol/L (ref 3.5–5.1)
Sodium: 140 mmol/L (ref 135–145)

## 2018-09-01 MED ORDER — CLINDAMYCIN HCL 150 MG PO CAPS: 300.0000 mg | ORAL_CAPSULE | Freq: Four times a day (QID) | ORAL | 0 refills | Status: DC

## 2018-09-01 MED ORDER — SODIUM CHLORIDE 0.9 % IV BOLUS
1000.0000 mL | Freq: Once | INTRAVENOUS | Status: AC
  Administered 2018-09-01: 1000 mL via INTRAVENOUS

## 2018-09-01 MED ORDER — DEXAMETHASONE SODIUM PHOSPHATE 10 MG/ML IJ SOLN
10.0000 mg | Freq: Once | INTRAMUSCULAR | Status: AC
  Administered 2018-09-01: 10 mg via INTRAVENOUS
  Filled 2018-09-01: qty 1

## 2018-09-01 MED ORDER — ACETAMINOPHEN 325 MG PO TABS
650.0000 mg | ORAL_TABLET | Freq: Once | ORAL | Status: AC
  Administered 2018-09-01: 650 mg via ORAL
  Filled 2018-09-01: qty 2

## 2018-09-01 MED ORDER — HYDROCODONE-ACETAMINOPHEN 7.5-325 MG/15ML PO SOLN
10.0000 mL | Freq: Once | ORAL | Status: AC
  Administered 2018-09-01: 10 mL via ORAL
  Filled 2018-09-01: qty 15

## 2018-09-01 MED ORDER — CLINDAMYCIN PHOSPHATE 600 MG/50ML IV SOLN
600.0000 mg | Freq: Once | INTRAVENOUS | Status: AC
  Administered 2018-09-01: 600 mg via INTRAVENOUS
  Filled 2018-09-01: qty 50

## 2018-09-01 MED ORDER — IOHEXOL 300 MG/ML  SOLN
100.0000 mL | Freq: Once | INTRAMUSCULAR | Status: AC | PRN
  Administered 2018-09-01: 100 mL via INTRAVENOUS

## 2018-09-01 NOTE — ED Notes (Signed)
Patient verbalizes understanding of discharge instructions. Opportunity for questioning and answers were provided. Armband removed by staff, pt discharged from ED.  

## 2018-09-01 NOTE — Discharge Instructions (Signed)
Please read and follow all provided instructions.  Your diagnoses today include:  1. Streptococcal pharyngitis     Tests performed today include:  Strep test: was POSITIVE for strep throat  CT scan of the neck -shows tonsillitis  Flu test was negative  Blood counts and electrolytes -elevated white blood cells, slightly elevated blood sugar  Vital signs. See below for your results today.   Medications prescribed:   Clindamycin - antibiotic  You have been prescribed an antibiotic medicine: take the entire course of medicine even if you are feeling better. Stopping early can cause the antibiotic not to work.  Take any medications prescribed only as directed.   Home care instructions:  Please read the educational materials provided and follow any instructions contained in this packet.  Follow-up instructions: Please follow-up with your primary care provider as needed for further evaluation of your symptoms.  Return instructions:   Please return to the Emergency Department if you experience worsening symptoms.   Return if you have worsening problems swallowing, your neck becomes swollen, you cannot swallow your saliva or your voice becomes muffled.   Return with high persistent fever, persistent vomiting, or if you have trouble breathing.   Please return if you have any other emergent concerns.  Additional Information:  Your vital signs today were: BP (!) 138/110    Pulse 79    Temp (!) 100.5 F (38.1 C) (Oral)    Resp 16    Ht 5\' 7"  (1.702 m)    Wt (!) 142.9 kg    SpO2 97%    BMI 49.34 kg/m  If your blood pressure (BP) was elevated above 135/85 this visit, please have this repeated by your doctor within one month. --------------

## 2018-09-01 NOTE — ED Provider Notes (Signed)
Patient handoff from [place] PA-C at shift change.  Patient with sore throat, history of diabetes.  Awaiting CT imaging to rule out abscess.  Strep test was positive.  Patient has received IV clindamycin, Decadron, pain medication.  He is talking well in no distress.  Updated on all results.  Discussed that he should monitor his blood sugars carefully at home.  Discussed importance of good hydration.  Encouraged return to the emergency department with inability to swallow, worsening pain, trouble breathing, neck swelling, or other concerns.  Patient verbalizes understanding agrees with plan.  Home with work note.  BP (!) 138/110   Pulse 79   Temp (!) 100.5 F (38.1 C) (Oral)   Resp 16   Ht 5\' 7"  (1.702 m)   Wt (!) 142.9 kg   SpO2 97%   BMI 49.34 kg/m    Results for orders placed or performed during the hospital encounter of 09/01/18  Group A Strep by PCR  Result Value Ref Range   Group A Strep by PCR DETECTED (A) NOT DETECTED  Influenza panel by PCR (type A & B)  Result Value Ref Range   Influenza A By PCR NEGATIVE NEGATIVE   Influenza B By PCR NEGATIVE NEGATIVE  CBC with Differential  Result Value Ref Range   WBC 14.4 (H) 4.0 - 10.5 K/uL   RBC 5.17 4.22 - 5.81 MIL/uL   Hemoglobin 14.7 13.0 - 17.0 g/dL   HCT 44.7 39.0 - 52.0 %   MCV 86.5 80.0 - 100.0 fL   MCH 28.4 26.0 - 34.0 pg   MCHC 32.9 30.0 - 36.0 g/dL   RDW 16.1 (H) 11.5 - 15.5 %   Platelets 210 150 - 400 K/uL   nRBC 0.0 0.0 - 0.2 %   Neutrophils Relative % 79 %   Neutro Abs 11.4 (H) 1.7 - 7.7 K/uL   Lymphocytes Relative 11 %   Lymphs Abs 1.6 0.7 - 4.0 K/uL   Monocytes Relative 8 %   Monocytes Absolute 1.2 (H) 0.1 - 1.0 K/uL   Eosinophils Relative 0 %   Eosinophils Absolute 0.0 0.0 - 0.5 K/uL   Basophils Relative 1 %   Basophils Absolute 0.1 0.0 - 0.1 K/uL   Immature Granulocytes 1 %   Abs Immature Granulocytes 0.07 0.00 - 0.07 K/uL  Basic metabolic panel  Result Value Ref Range   Sodium 140 135 - 145 mmol/L    Potassium 3.6 3.5 - 5.1 mmol/L   Chloride 107 98 - 111 mmol/L   CO2 27 22 - 32 mmol/L   Glucose, Bld 118 (H) 70 - 99 mg/dL   BUN 14 6 - 20 mg/dL   Creatinine, Ser 1.44 (H) 0.61 - 1.24 mg/dL   Calcium 9.0 8.9 - 10.3 mg/dL   GFR calc non Af Amer 59 (L) >60 mL/min   GFR calc Af Amer >60 >60 mL/min   Anion gap 6 5 - 15   Dg Chest 2 View  Result Date: 08/04/2018 CLINICAL DATA:  Cough. EXAM: CHEST - 2 VIEW COMPARISON:  Feb 10, 2018 FINDINGS: Left lingular infiltrate.  No other interval changes. IMPRESSION: Left lingular infiltrate, likely pneumonia given history. Recommend follow-up to resolution. Electronically Signed   By: Dorise Bullion III M.D   On: 08/04/2018 20:51   Ct Soft Tissue Neck W Contrast  Result Date: 09/01/2018 CLINICAL DATA:  Asymmetric right tonsillar enlargement. Sore throat. Fever. EXAM: CT NECK WITH CONTRAST TECHNIQUE: Multidetector CT imaging of the neck was performed using the  standard protocol following the bolus administration of intravenous contrast. CONTRAST:  195mL OMNIPAQUE IOHEXOL 300 MG/ML  SOLN COMPARISON:  None. FINDINGS: Pharynx and larynx: Both palatine tonsils are enlarged, slightly greater on the right. No peritonsillar or retropharyngeal fluid collection is identified. The airway is patent. Mild motion artifact is noted through the larynx. Salivary glands: No inflammation, mass, or stone. Thyroid: Unremarkable. Lymph nodes: Scattered normal size to borderline enlarged anterior cervical lymph nodes bilaterally, likely reactive. Vascular: Unremarkable. Limited intracranial: Unremarkable. Visualized orbits: Unremarkable. Mastoids and visualized paranasal sinuses: Clear. Skeleton: Focal osseous sclerosis with central hypoattenuation involving the left maxilla which may reflect the sequelae of remote dental disease related to an extracted premolar tooth. Mild cervical spondylosis. Upper chest: No apical lung consolidation. Other: None. IMPRESSION: Tonsillar  enlargement without abscess. Electronically Signed   By: Logan Bores M.D.   On: 09/01/2018 17:02      Carlisle Cater, PA-C 09/01/18 1846    Veryl Speak, MD 09/01/18 2002

## 2018-09-01 NOTE — ED Triage Notes (Signed)
Pt endorses body aches and sore throat beginning at 1900 last night. Pt reports fever of 101.6 at home. Pt denies taking any tylenol today. Pt denies hx of strep.

## 2018-09-01 NOTE — ED Provider Notes (Addendum)
[facility] EMERGENCY DEPARTMENT Provider Note   CSN: [id number] Arrival date & time: 09/01/18  1356     History   Chief Complaint Chief Complaint  Patient presents with  . Generalized Body Aches  . Sore Throat    HPI Jeffrey [name] is a 42 y.o. male h/o DM on insulin, HTN, HLD, renal insufficiency, asthma, solitary left kidney, here for evaluation of sore throat sudden onset last night, gradually worsening, severe, constant.  Associated with body and joint pains since last night, fever, chills.  He denies HA, congestion, rhinorrhea, cough, CP, SOB, nausea, vomiting, abdominal pain, changes in BM, dysuria.  No muffled voice, drooling, neck pain.  Has been taking tylenol witouot significant relief.  No sick contacts.   Has school aged children at home. He is compliant with insulin regimen, reports CBG are well controlled in the 90-100s.   HPI  Past Medical History:  Diagnosis Date  . Anxiety   . Aspirin allergy   . Childhood asthma   . Chronic systolic CHF (congestive heart failure) ([place])    a. EF 20-25% in 2012. b. EF 45-50% in 10/2011 with nonischemic nuc - presumed NICM. c. 12/2014 Echo: Sev depressed LV fxn, sev dil LV, mild LVH, mild MR, sev dil LA, mildly reduced RV fxn.  . CKD (chronic kidney disease) stage 2, GFR 60-89 ml/min   . High cholesterol   . Hypertension   . Morbid obesity ([place])   . Nephrolithiasis   . OSA on CPAP   . Presumed NICM    a. 04/2014 Myoview: EF 26%, glob HK, sev glob HK, ? prior infarct;  b. Never cathed 2/2 CKD.  [name] Renal cell carcinoma ([place])    a. s/p Rt robotic assisted partial converted to radical nephrectomy on 01/2013.  . Troponin level elevated    a. 04/2014, 12/2014: felt due to CHF.  . Type II diabetes mellitus ([place])   . Ventricular tachycardia ([place])    a. appropriate ICD therapy 12/2017    Patient Active Problem List   Diagnosis Date Noted  . Lingular pneumonia 08/04/2018  . Ventricular tachycardia ([place]) 12/30/2017  .  PAF (paroxysmal atrial fibrillation) ([place])   . Dilated cardiomyopathy ([place])   . Pollen allergy 02/17/2017  . Dyslipidemia 02/17/2017  . Hematochezia 11/08/2015  . CKD (chronic kidney disease), stage III ([place]) 06/20/2015  . Cardiac device in situ, other   . Chronic systolic CHF (congestive heart failure) ([place]) 03/11/2015  . Gouty arthritis 03/11/2015  . Chest pain 12/28/2014  . Aspirin allergy 04/08/2014  . Renal cell carcinoma ([place])   . Gout attack 11/10/2011  . Morbid obesity ([place]) 10/23/2011  . OSA (obstructive sleep apnea) 10/23/2011  . Type 2 diabetes mellitus with stage 3 chronic kidney disease, with long-term current use of insulin ([place]) 10/22/2011  . Hyperlipidemia associated with type 2 diabetes mellitus ([place]) 12/25/2010  . Hypertension associated with diabetes ([place]) 12/25/2010    Past Surgical History:  Procedure Laterality Date  . APPENDECTOMY  07/2004  . CARDIAC CATHETERIZATION N/A 05/17/2015   Procedure: Right/Left Heart Cath and Coronary Angiography;  Surgeon: Jolaine Artist, MD;  Location: [facility];  Service: Cardiovascular;  Laterality: N/A;  . EP IMPLANTABLE DEVICE N/A 05/17/2015   Procedure: SubQ ICD Implant;  Surgeon: Will Meredith Leeds, MD;  Location: [facility];  Service: Cardiovascular;  Laterality: N/A;  . ROBOTIC ASSISTED LAPAROSCOPIC LYSIS OF ADHESION  01/13/2013   Procedure: ROBOTIC ASSISTED LAPAROSCOPIC LYSIS OF ADHESION  EXTENSIVE;  Surgeon: Alexis Frock, MD;  Location: [facility];  Service: Urology;;  . ROBOTIC ASSITED PARTIAL NEPHRECTOMY Right 01/13/2013   Procedure: ROBOTIC ASSITED PARTIAL NEPHRECTOMY CONVERTED TO ROBOTIC ASSISTED RIGHT RADICAL NEPHRECTOMY;  Surgeon: Alexis Frock, MD;  Location: [facility];  Service: Urology;  Laterality: Right;        Home Medications    Prior to Admission medications   Medication Sig Start Date End Date Taking? Authorizing Provider  allopurinol (ZYLOPRIM) 100 MG tablet Take 1 tablet (100 mg total)  by mouth daily. 02/17/17  Yes Tresa Garter, MD  amiodarone (PACERONE) 200 MG tablet Take 200 mg by mouth daily.   Yes [provider]  apixaban (ELIQUIS) 5 MG TABS tablet Take 1 tablet (5 mg total) by mouth 2 (two) times daily. 08/16/18  Yes [name], FNP  atorvastatin (LIPITOR) 80 MG tablet TAKE 1 TABLET (80 MG TOTAL) BY MOUTH EVERY EVENING. Patient taking differently: Take 80 mg by mouth daily at 6 PM.  02/24/18  Yes Jegede, Olugbemiga E, MD  gabapentin (NEURONTIN) 300 MG capsule TAKE 1 CAPSULE BY MOUTH 3 TIMES DAILY. Patient taking differently: Take 300 mg by mouth 3 (three) times daily.  07/12/18  Yes [name], FNP  insulin NPH-regular Human (NOVOLIN 70/30) (70-30) 100 UNIT/ML injection Inject 34 Units into the skin 2 (two) times daily with a meal. 07/09/17  Yes Jegede, Olugbemiga E, MD  isosorbide-hydrALAZINE (BIDIL) 20-37.5 MG tablet Take 1 tablet by mouth 3 (three) times daily. 07/12/18  Yes Bensimhon, Shaune Pascal, MD  Magnesium Oxide 200 MG TABS Take 100 mg by mouth daily.   Yes [provider]  meclizine (ANTIVERT) 25 MG tablet Take 1 tablet (25 mg total) by mouth 3 (three) times daily as needed for dizziness (Take meclizine for spinning dizziness. You may also take Valium if your symptoms are not relieved by the meclizine alone.). 05/10/18  Yes [name], FNP  MITIGARE 0.6 MG CAPS Take 1 tablet by mouth as needed (Gout).  07/12/18  Yes [provider]  potassium chloride (K-DUR) 10 MEQ tablet Take 2 tablets (20 mEq total) by mouth daily. 05/10/18  Yes [name], FNP  sacubitril-valsartan (ENTRESTO) 97-103 MG Take 1 tablet by mouth 2 (two) times daily. 08/16/18  Yes [name], FNP  torsemide (DEMADEX) 20 MG tablet Take 2 tablets (40 mg total) by mouth daily. 01/03/18  Yes Bensimhon, Shaune Pascal, MD  traMADol (ULTRAM) 50 MG tablet Take 1 tablet (50 mg total) by mouth every 6 (six) hours as needed. 07/25/18  Yes [name], FNP    Vitamin D, Ergocalciferol, (DRISDOL) 1.25 MG (50000 UT) CAPS capsule Take 50,000 Units by mouth every 30 (thirty) days. 08/10/18  Yes [provider]  clindamycin (CLEOCIN) 150 MG capsule Take 2 capsules (300 mg total) by mouth every 6 (six) hours. 09/01/18   Carlisle Cater, PA-C    Family History Family History  Problem Relation Age of Onset  . Hypertension Mother   . Diabetes Maternal Aunt   . Heart attack Neg Hx   . Stroke Neg Hx     Social History Social History   Tobacco Use  . Smoking status: Former Smoker    Packs/day: 0.25    Years: 22.00    Pack years: 5.50    Last attempt to quit: 04/16/2015    Years since quitting: 3.3  . Smokeless tobacco: Never Used  Substance Use Topics  . Alcohol use: No  Comment: occ   . Drug use: Yes    Frequency: 14.0 times per week    Types: Marijuana    Comment: uses marijuana twice a day     Allergies   Aspirin; Bee venom; Lisinopril; and Tomato   Review of Systems Review of Systems  Constitutional: Positive for appetite change, chills and fever.  HENT: Positive for sore throat.   Musculoskeletal: Positive for arthralgias and myalgias.  Allergic/Immunologic: Positive for immunocompromised state.  All other systems reviewed and are negative.    Physical Exam Updated Vital Signs BP (!) 138/110   Pulse 79   Temp (!) 100.5 F (38.1 C) (Oral)   Resp 16   Ht 5\' 7"  (1.702 m)   Wt (!) 142.9 kg   SpO2 97%   BMI 49.34 kg/m   Physical Exam  Constitutional: He is oriented to person, place, and time. He appears well-developed and well-nourished. No distress.  NAD.  HENT:  Head: Normocephalic and atraumatic.  Right Ear: External ear normal.  Left Ear: External ear normal.  Nose: Nose normal.  Difficult airway to visualize, large tongue.  Top of tonsils visualized with asymmetric R tonsilar hypertrophy.  Diffuse erythema to soft palate, oropharynx and tonsils. No trismus.  No pooling of oral secretions. No muffled  voice  Eyes: Conjunctivae and EOM are normal. No scleral icterus.  Neck: Normal range of motion. Neck supple.  Prominent submandibular lymph nodes R>L.   Cardiovascular: Normal rate, regular rhythm, normal heart sounds and intact distal pulses.  No murmur heard. Pulmonary/Chest: Effort normal and breath sounds normal. He has no wheezes.  Musculoskeletal: Normal range of motion. He exhibits no deformity.  Neurological: He is alert and oriented to person, place, and time.  Skin: Skin is warm and dry. Capillary refill takes less than 2 seconds.  Psychiatric: He has a normal mood and affect. His behavior is normal. Judgment and thought content normal.  Nursing note and vitals reviewed.    ED Treatments / Results  Labs (all labs ordered are listed, but only abnormal results are displayed) Labs Reviewed  GROUP A STREP BY PCR - Abnormal; Notable for the following components:      Result Value   Group A Strep by PCR DETECTED (*)    All other components within normal limits  CBC WITH DIFFERENTIAL/PLATELET - Abnormal; Notable for the following components:   WBC 14.4 (*)    RDW 16.1 (*)    Neutro Abs 11.4 (*)    Monocytes Absolute 1.2 (*)    All other components within normal limits  BASIC METABOLIC PANEL - Abnormal; Notable for the following components:   Glucose, Bld 118 (*)    Creatinine, Ser 1.44 (*)    GFR calc non Af Amer 59 (*)    All other components within normal limits  INFLUENZA PANEL BY PCR (TYPE A & B)    EKG None  Radiology Ct Soft Tissue Neck W Contrast  Result Date: 09/01/2018 CLINICAL DATA:  Asymmetric right tonsillar enlargement. Sore throat. Fever. EXAM: CT NECK WITH CONTRAST TECHNIQUE: Multidetector CT imaging of the neck was performed using the standard protocol following the bolus administration of intravenous contrast. CONTRAST:  123mL OMNIPAQUE IOHEXOL 300 MG/ML  SOLN COMPARISON:  None. FINDINGS: Pharynx and larynx: Both palatine tonsils are enlarged, slightly  greater on the right. No peritonsillar or retropharyngeal fluid collection is identified. The airway is patent. Mild motion artifact is noted through the larynx. Salivary glands: No inflammation, mass, or stone. Thyroid: Unremarkable.  Lymph nodes: Scattered normal size to borderline enlarged anterior cervical lymph nodes bilaterally, likely reactive. Vascular: Unremarkable. Limited intracranial: Unremarkable. Visualized orbits: Unremarkable. Mastoids and visualized paranasal sinuses: Clear. Skeleton: Focal osseous sclerosis with central hypoattenuation involving the left maxilla which may reflect the sequelae of remote dental disease related to an extracted premolar tooth. Mild cervical spondylosis. Upper chest: No apical lung consolidation. Other: None. IMPRESSION: Tonsillar enlargement without abscess. Electronically Signed   By: Logan Bores M.D.   On: 09/01/2018 17:02    Procedures Procedures (including critical care time)  Medications Ordered in ED Medications  acetaminophen (TYLENOL) tablet 650 mg (650 mg Oral Given 09/01/18 1514)  HYDROcodone-acetaminophen (HYCET) 7.5-325 mg/15 ml solution 10 mL (10 mLs Oral Given 09/01/18 1514)  sodium chloride 0.9 % bolus 1,000 mL (0 mLs Intravenous Stopped 09/01/18 1655)  dexamethasone (DECADRON) injection 10 mg (10 mg Intravenous Given 09/01/18 1514)  clindamycin (CLEOCIN) IVPB 600 mg (0 mg Intravenous Stopped 09/01/18 1617)  iohexol (OMNIPAQUE) 300 MG/ML solution 100 mL (100 mLs Intravenous Contrast Given 09/01/18 1631)     Initial Impression / Assessment and Plan / ED Course  I have reviewed the triage vital signs and the nursing notes.  Pertinent labs & imaging results that were available during my care of the patient were reviewed by me and considered in my medical decision making (see chart for details).      42 y.o. yo male here with sore throat. Given benign symptomatology considering viral pharyngitis vs strep pharyngitis vs other viral  URI. Difficult airway to examine but he has asymmetric R tonsillar hypertrophy and prominent right submandibular lymph node.  No uvula deviation, hot potato voice, trismus, or drooling however given limited exam, I am not comfortable r/u deeper neck abscess or PTA without CT. He is technically immunocompromise given h/o DM. We will obtain screening labs and neck CT. [place], IVF, decadron ordered.  Discussed risk of hyperglycemia with decadron.  Since he has well controlled CBG I think 1 dose should be ok.  He was instructed in strict CBG monitoring. He is comfrotable with this.    Final Clinical Impressions(s) / ED Diagnoses   Pt will be handed off to oncoming EDPA [place] who will f/u on CT neck, update and determine disposition.  Final diagnoses:  Streptococcal pharyngitis    ED Discharge Orders         Ordered    clindamycin (CLEOCIN) 150 MG capsule  Every 6 hours     09/01/18 1724           Arlean Hopping 09/02/18 2032    Pattricia Boss, MD 09/03/18 1601

## 2018-09-04 DIAGNOSIS — G4733 Obstructive sleep apnea (adult) (pediatric): Secondary | ICD-10-CM | POA: Diagnosis not present

## 2018-09-06 ENCOUNTER — Ambulatory Visit (INDEPENDENT_AMBULATORY_CARE_PROVIDER_SITE_OTHER): Payer: Medicaid Other | Admitting: Family Medicine

## 2018-09-06 ENCOUNTER — Encounter: Payer: Self-pay | Admitting: Family Medicine

## 2018-09-06 VITALS — BP 134/84 | HR 80 | Temp 98.5°F | Resp 16 | Ht 67.0 in | Wt 316.0 lb

## 2018-09-06 DIAGNOSIS — Z23 Encounter for immunization: Secondary | ICD-10-CM | POA: Diagnosis not present

## 2018-09-06 DIAGNOSIS — Z09 Encounter for follow-up examination after completed treatment for conditions other than malignant neoplasm: Secondary | ICD-10-CM

## 2018-09-06 NOTE — Progress Notes (Signed)
Acute Office Visit  Subjective:    Patient ID: Jeffrey [name], male    DOB: July 07, 1976, 42 y.o.   MRN: [id number]  Chief Complaint  Patient presents with  . Follow-up    follow up from strep     HPI Patient is in today for follow up on strep throat. Patient seen in the ED and started on cleocin every 6 hours on 09/01/2018. Patient states that he is feeling better and would like a flu shot.   Past Medical History:  Diagnosis Date  . Anxiety   . Aspirin allergy   . Childhood asthma   . Chronic systolic CHF (congestive heart failure) ([place])    a. EF 20-25% in 2012. b. EF 45-50% in 10/2011 with nonischemic nuc - presumed NICM. c. 12/2014 Echo: Sev depressed LV fxn, sev dil LV, mild LVH, mild MR, sev dil LA, mildly reduced RV fxn.  . CKD (chronic kidney disease) stage 2, GFR 60-89 ml/min   . High cholesterol   . Hypertension   . Morbid obesity ([place])   . Nephrolithiasis   . OSA on CPAP   . Presumed NICM    a. 04/2014 Myoview: EF 26%, glob HK, sev glob HK, ? prior infarct;  b. Never cathed 2/2 CKD.  [name] Renal cell carcinoma ([place])    a. s/p Rt robotic assisted partial converted to radical nephrectomy on 01/2013.  . Troponin level elevated    a. 04/2014, 12/2014: felt due to CHF.  . Type II diabetes mellitus ([place])   . Ventricular tachycardia ([place])    a. appropriate ICD therapy 12/2017    Past Surgical History:  Procedure Laterality Date  . APPENDECTOMY  07/2004  . CARDIAC CATHETERIZATION N/A 05/17/2015   Procedure: Right/Left Heart Cath and Coronary Angiography;  Surgeon: Jolaine Artist, MD;  Location: [place] [facility];  Service: Cardiovascular;  Laterality: N/A;  . EP IMPLANTABLE DEVICE N/A 05/17/2015   Procedure: SubQ ICD Implant;  Surgeon: Will Meredith Leeds, MD;  Location: [place] [facility];  Service: Cardiovascular;  Laterality: N/A;  . ROBOTIC ASSISTED LAPAROSCOPIC LYSIS OF ADHESION  01/13/2013   Procedure: ROBOTIC ASSISTED LAPAROSCOPIC LYSIS OF ADHESION EXTENSIVE;  Surgeon:  Alexis Frock, MD;  Location: [facility];  Service: Urology;;  . ROBOTIC ASSITED PARTIAL NEPHRECTOMY Right 01/13/2013   Procedure: ROBOTIC ASSITED PARTIAL NEPHRECTOMY CONVERTED TO ROBOTIC ASSISTED RIGHT RADICAL NEPHRECTOMY;  Surgeon: Alexis Frock, MD;  Location: [facility];  Service: Urology;  Laterality: Right;    Family History  Problem Relation Age of Onset  . Hypertension Mother   . Diabetes Maternal Aunt   . Heart attack Neg Hx   . Stroke Neg Hx     Social History   Socioeconomic History  . Marital status: Significant Other    Spouse name: Not on file  . Number of children: Not on file  . Years of education: Not on file  . Highest education level: Not on file  Occupational History  . Not on file  Social Needs  . Financial resource strain: Not on file  . Food insecurity:    Worry: Not on file    Inability: Not on file  . Transportation needs:    Medical: Not on file    Non-medical: Not on file  Tobacco Use  . Smoking status: Former Smoker    Packs/day: 0.25    Years: 22.00    Pack years: 5.50    Last attempt to quit: 04/16/2015    Years since  quitting: 3.3  . Smokeless tobacco: Never Used  Substance and Sexual Activity  . Alcohol use: No    Comment: occ   . Drug use: Yes    Frequency: 14.0 times per week    Types: Marijuana    Comment: uses marijuana twice a day  . Sexual activity: Yes    Birth control/protection: Condom  Lifestyle  . Physical activity:    Days per week: Not on file    Minutes per session: Not on file  . Stress: Not on file  Relationships  . Social connections:    Talks on phone: Not on file    Gets together: Not on file    Attends religious service: Not on file    Active member of club or organization: Not on file    Attends meetings of clubs or organizations: Not on file    Relationship status: Not on file  . Intimate partner violence:    Fear of current or ex partner: Not on file    Emotionally abused: Not on file    Physically abused:  Not on file    Forced sexual activity: Not on file  Other Topics Concern  . Not on file  Social History Narrative  . Not on file    Outpatient Medications Prior to Visit  Medication Sig Dispense Refill  . allopurinol (ZYLOPRIM) 100 MG tablet Take 1 tablet (100 mg total) by mouth daily. 90 tablet 3  . amiodarone (PACERONE) 200 MG tablet Take 200 mg by mouth daily.    [name] apixaban (ELIQUIS) 5 MG TABS tablet Take 1 tablet (5 mg total) by mouth 2 (two) times daily. 60 tablet 10  . atorvastatin (LIPITOR) 80 MG tablet TAKE 1 TABLET (80 MG TOTAL) BY MOUTH EVERY EVENING. (Patient taking differently: Take 80 mg by mouth daily at 6 PM. ) 90 tablet 0  . clindamycin (CLEOCIN) 150 MG capsule Take 2 capsules (300 mg total) by mouth every 6 (six) hours. 56 capsule 0  . gabapentin (NEURONTIN) 300 MG capsule TAKE 1 CAPSULE BY MOUTH 3 TIMES DAILY. (Patient taking differently: Take 300 mg by mouth 3 (three) times daily. ) 90 capsule 3  . insulin NPH-regular Human (NOVOLIN 70/30) (70-30) 100 UNIT/ML injection Inject 34 Units into the skin 2 (two) times daily with a meal. 60 mL 3  . isosorbide-hydrALAZINE (BIDIL) 20-37.5 MG tablet Take 1 tablet by mouth 3 (three) times daily. 90 tablet 5  . Magnesium Oxide 200 MG TABS Take 100 mg by mouth daily.    . meclizine (ANTIVERT) 25 MG tablet Take 1 tablet (25 mg total) by mouth 3 (three) times daily as needed for dizziness (Take meclizine for spinning dizziness. You may also take Valium if your symptoms are not relieved by the meclizine alone.). 30 tablet 4  . MITIGARE 0.6 MG CAPS Take 1 tablet by mouth as needed (Gout).   3  . potassium chloride (K-DUR) 10 MEQ tablet Take 2 tablets (20 mEq total) by mouth daily. 30 tablet 5  . sacubitril-valsartan (ENTRESTO) 97-103 MG Take 1 tablet by mouth 2 (two) times daily. 180 tablet 3  . torsemide (DEMADEX) 20 MG tablet Take 2 tablets (40 mg total) by mouth daily. 60 tablet 3  . traMADol (ULTRAM) 50 MG tablet Take 1 tablet (50 mg  total) by mouth every 6 (six) hours as needed. 30 tablet 0  . Vitamin D, Ergocalciferol, (DRISDOL) 1.25 MG (50000 UT) CAPS capsule Take 50,000 Units by mouth every 30 (thirty) days.  5   No facility-administered medications prior to visit.     Allergies  Allergen Reactions  . Aspirin Shortness Of Breath, Itching and Rash     Burning sensation (Patient reports he tolerates other NSAIDS)   . Bee Venom Hives and Swelling  . Lisinopril Cough  . Tomato Rash    Review of Systems  Constitutional: Negative.   HENT: Negative.   Eyes: Negative.   Respiratory: Negative.   Cardiovascular: Negative.   Gastrointestinal: Negative.   Genitourinary: Negative.   Musculoskeletal: Negative.   Skin: Negative.   Neurological: Negative.   Psychiatric/Behavioral: Negative.        Objective:    Physical Exam  Constitutional: He is oriented to person, place, and time. He appears well-developed and well-nourished. No distress.  HENT:  Head: Normocephalic and atraumatic.  Right Ear: External ear normal.  Left Ear: External ear normal.  Mouth/Throat: Oropharynx is clear and moist. No oropharyngeal exudate.  Eyes: Pupils are equal, round, and reactive to light. Conjunctivae and EOM are normal.  Neck: Normal range of motion. Neck supple.  Cardiovascular: Normal rate, regular rhythm and normal heart sounds.  Pulmonary/Chest: Effort normal and breath sounds normal. No respiratory distress.  Musculoskeletal: Normal range of motion.  Lymphadenopathy:    He has no cervical adenopathy.  Neurological: He is alert and oriented to person, place, and time.  Skin: Skin is warm and dry.  Psychiatric: He has a normal mood and affect. His behavior is normal. Judgment and thought content normal.  Nursing note and vitals reviewed.   BP 134/84 (BP Location: Right Arm, Patient Position: Sitting, Cuff Size: Large)   Pulse 80   Temp 98.5 F (36.9 C) (Oral)   Resp 16   Ht 5\' 7"  (1.702 m)   Wt (!) 316 lb (143.3  kg)   SpO2 94%   BMI 49.49 kg/m  Wt Readings from Last 3 Encounters:  09/06/18 (!) 316 lb (143.3 kg)  09/01/18 (!) 315 lb (142.9 kg)  08/16/18 (!) 322 lb (146.1 kg)    Health Maintenance Due  Topic Date Due  . OPHTHALMOLOGY EXAM  11/02/1984  . LIPID PANEL  03/31/2014  . URINE MICROALBUMIN  02/17/2018    There are no preventive care reminders to display for this patient.   Lab Results  Component Value Date   TSH 2.248 02/10/2018   Lab Results  Component Value Date   WBC 14.4 (H) 09/01/2018   HGB 14.7 09/01/2018   HCT 44.7 09/01/2018   MCV 86.5 09/01/2018   PLT 210 09/01/2018   Lab Results  Component Value Date   NA 140 09/01/2018   K 3.6 09/01/2018   CO2 27 09/01/2018   GLUCOSE 118 (H) 09/01/2018   BUN 14 09/01/2018   CREATININE 1.44 (H) 09/01/2018   BILITOT 0.4 11/01/2017   ALKPHOS 65 11/01/2017   AST 21 11/01/2017   ALT 30 11/01/2017   PROT 7.3 11/01/2017   ALBUMIN 4.0 11/01/2017   CALCIUM 9.0 09/01/2018   ANIONGAP 6 09/01/2018   GFR 58.42 (L) 01/22/2015   Lab Results  Component Value Date   CHOL 142 03/31/2013   Lab Results  Component Value Date   HDL 25.80 (L) 03/31/2013   Lab Results  Component Value Date   LDLCALC 87 03/31/2013   Lab Results  Component Value Date   TRIG 144.0 03/31/2013   Lab Results  Component Value Date   CHOLHDL 6 03/31/2013   Lab Results  Component Value Date   HGBA1C  6.1 (A) 07/25/2018       Assessment & Plan:   Problem List Items Addressed This Visit    None    Visit Diagnoses    Follow-up exam    -  Primary   Need for immunization against influenza       Relevant Orders   Flu Vaccine QUAD 36+ mos IM (Completed)     Continue with current medications. RTC PRN.   No orders of the defined types were placed in this encounter.    Lanae Boast, FNP

## 2018-09-06 NOTE — Patient Instructions (Signed)
Strep Throat Strep throat is an infection of the throat. It is caused by germs. Strep throat spreads from person to person because of coughing, sneezing, or close contact. Follow these instructions at home: Medicines  Take over-the-counter and prescription medicines only as told by your doctor.  Take your antibiotic medicine as told by your doctor. Do not stop taking the medicine even if you feel better.  Have family members who also have a sore throat or fever go to a doctor. Eating and drinking  Do not share food, drinking cups, or personal items.  Try eating soft foods until your sore throat feels better.  Drink enough fluid to keep your pee (urine) clear or pale yellow. General instructions  Rinse your mouth (gargle) with a salt-water mixture 3-4 times per day or as needed. To make a salt-water mixture, stir -1 tsp of salt into 1 cup of warm water.  Make sure that all people in your house wash their hands well.  Rest.  Stay home from school or work until you have been taking antibiotics for 24 hours.  Keep all follow-up visits as told by your doctor. This is important. Contact a doctor if:  Your neck keeps getting bigger.  You get a rash, cough, or earache.  You cough up thick liquid that is green, yellow-brown, or bloody.  You have pain that does not get better with medicine.  Your problems get worse instead of getting better.  You have a fever. Get help right away if:  You throw up (vomit).  You get a very bad headache.  You neck hurts or it feels stiff.  You have chest pain or you are short of breath.  You have drooling, very bad throat pain, or changes in your voice.  Your neck is swollen or the skin gets red and tender.  Your mouth is dry or you are peeing less than normal.  You keep feeling more tired or it is hard to wake up.  Your joints are red or they hurt. This information is not intended to replace advice given to you by your health care  provider. Make sure you discuss any questions you have with your health care provider. Document Released: 03/09/2008 Document Revised: 05/20/2016 Document Reviewed: 01/14/2015 Elsevier Interactive Patient Education  2018 Elsevier Inc.  

## 2018-09-14 DIAGNOSIS — I509 Heart failure, unspecified: Secondary | ICD-10-CM | POA: Diagnosis not present

## 2018-09-14 DIAGNOSIS — I129 Hypertensive chronic kidney disease with stage 1 through stage 4 chronic kidney disease, or unspecified chronic kidney disease: Secondary | ICD-10-CM | POA: Diagnosis not present

## 2018-09-14 DIAGNOSIS — N183 Chronic kidney disease, stage 3 (moderate): Secondary | ICD-10-CM | POA: Diagnosis not present

## 2018-09-14 DIAGNOSIS — M109 Gout, unspecified: Secondary | ICD-10-CM | POA: Diagnosis not present

## 2018-09-14 DIAGNOSIS — E669 Obesity, unspecified: Secondary | ICD-10-CM | POA: Diagnosis not present

## 2018-09-14 DIAGNOSIS — Z905 Acquired absence of kidney: Secondary | ICD-10-CM | POA: Diagnosis not present

## 2018-09-14 DIAGNOSIS — E559 Vitamin D deficiency, unspecified: Secondary | ICD-10-CM | POA: Diagnosis not present

## 2018-09-19 ENCOUNTER — Other Ambulatory Visit: Payer: Self-pay | Admitting: Family Medicine

## 2018-09-19 DIAGNOSIS — M545 Low back pain, unspecified: Secondary | ICD-10-CM

## 2018-09-19 DIAGNOSIS — M25561 Pain in right knee: Principal | ICD-10-CM

## 2018-09-19 DIAGNOSIS — M25562 Pain in left knee: Principal | ICD-10-CM

## 2018-09-19 DIAGNOSIS — G8929 Other chronic pain: Secondary | ICD-10-CM

## 2018-09-19 MED FILL — ALLOPURINOL 100 MG TABLET: 100 | 30 days supply | Qty: 60 | Fill #6

## 2018-09-19 MED FILL — AMIODARONE HCL 200 MG TAB: 200 | 30 days supply | Qty: 30 | Fill #6

## 2018-09-19 MED FILL — CARVEDILOL 3.125 MG TABLET: 3.125 | 30 days supply | Qty: 60 | Fill #2

## 2018-09-19 MED FILL — VIT D2 1.25 MG (50,000 UNIT: 1.25 MG | 28 days supply | Qty: 4 | Fill #3

## 2018-09-19 MED FILL — POTASSIUM CHLORIDE ER 10 ME: 10 | 30 days supply | Qty: 60 | Fill #0

## 2018-09-19 MED FILL — BIDIL TABLET: 20-37.5 | 30 days supply | Qty: 90 | Fill #1

## 2018-09-19 MED FILL — MECLIZINE 25 MG TABLET: 25 | 10 days supply | Qty: 30 | Fill #3

## 2018-09-19 MED FILL — ELIQUIS 5 MG TABLET: 5 | 30 days supply | Qty: 60 | Fill #1

## 2018-09-19 MED FILL — GABAPENTIN 300 MG CAPSULE: 300 | 30 days supply | Qty: 90 | Fill #1

## 2018-09-19 MED FILL — TORSEMIDE 20 MG TABLET: 20 | 30 days supply | Qty: 60 | Fill #3

## 2018-09-20 ENCOUNTER — Other Ambulatory Visit: Payer: Self-pay | Admitting: Family Medicine

## 2018-09-20 ENCOUNTER — Telehealth: Payer: Self-pay

## 2018-09-20 DIAGNOSIS — M25562 Pain in left knee: Principal | ICD-10-CM

## 2018-09-20 DIAGNOSIS — G8929 Other chronic pain: Secondary | ICD-10-CM

## 2018-09-20 DIAGNOSIS — M545 Low back pain, unspecified: Secondary | ICD-10-CM

## 2018-09-20 DIAGNOSIS — M25561 Pain in right knee: Principal | ICD-10-CM

## 2018-09-20 MED ORDER — TRAMADOL HCL 50 MG PO TABS: 50.0000 mg | ORAL_TABLET | Freq: Four times a day (QID) | ORAL | 0 refills | Status: DC | PRN

## 2018-09-20 MED FILL — ENTRESTO 97 MG-103 MG TAB: 97-103 | 30 days supply | Qty: 60 | Fill #1

## 2018-09-20 NOTE — Progress Notes (Signed)
Rx for Tramadol sent to pharmacy today.

## 2018-09-20 NOTE — Telephone Encounter (Signed)
Rx for Tramadol sent to pharmacy today. Please inform patient that we re-assess his pain level at next office visit and will plan to changed pain medication at that time.

## 2018-09-20 NOTE — Telephone Encounter (Signed)
Patient needs a refill on Tramadol. 

## 2018-09-21 MED FILL — traMADol HCL 50 MG TABS: 50 | 7 days supply | Qty: 30 | Fill #0

## 2018-09-21 NOTE — Telephone Encounter (Signed)
Patient notified

## 2018-09-22 ENCOUNTER — Encounter (HOSPITAL_COMMUNITY): Payer: Medicaid Other

## 2018-10-05 DIAGNOSIS — G4733 Obstructive sleep apnea (adult) (pediatric): Secondary | ICD-10-CM | POA: Diagnosis not present

## 2018-10-10 ENCOUNTER — Ambulatory Visit (INDEPENDENT_AMBULATORY_CARE_PROVIDER_SITE_OTHER): Payer: Medicaid Other

## 2018-10-10 DIAGNOSIS — I5022 Chronic systolic (congestive) heart failure: Secondary | ICD-10-CM | POA: Diagnosis not present

## 2018-10-10 DIAGNOSIS — I420 Dilated cardiomyopathy: Secondary | ICD-10-CM

## 2018-10-10 LAB — CUP PACEART REMOTE DEVICE CHECK
Date Time Interrogation Session: 20200107225932
Implantable Lead Implant Date: 20160812
Implantable Lead Location: 753862
Implantable Lead Model: 3401
Implantable Pulse Generator Implant Date: 20160812
MDC IDC PG SERIAL: 117162

## 2018-10-10 NOTE — Progress Notes (Signed)
Patient ID: Jeffrey [name], male   DOB: 23-Dec-1975, 43 y.o.   MRN: [id number]      Advanced Heart Failure Clinic Note   Patient Name: Jeffrey [name] Date of Encounter: 10/11/2018  Primary Care Provider:  [name], FNP Primary Cardiologist:  Jeffrey Bark, MD  Primary HF Cardiologist: Bensimhon  History:  Jeffrey [name] is a 43 y.o. male with a history of  poorly controlled hypertension, R renal cell carcinoma s/p nephrectomy, DM2, OSA, gout, morbid obesity and systolic HF due tononischemic myopathy.    Admitted to [facility] in 3/16 secondary to atypical chest pain, dyspnea, and mild troponin elevation. Echo during hospitalization shows "severely reduced LV function" - EF not quantified due to poor windows. He was diuresed and was down about 7 pounds at discharge, at 289 on the hospital scale.   Admitted 05/14/15 with CP at rest. CXR looked improved from previous. CP resolved spontaneously after hospitalization and troponin was flat. R/LHC showed normal coronaries and moderately elevated filling pressures with moderately depressed cardiac output. [place] subcutaneous ICD was implanted 05/17/15.    Admitted 9/4 - 03/06/82 with A/C systolic CHF in the setting of new afib. Started on IV lasix and amiodarone. Pt diuresed and converted to NSR without requiring DCCV. Started on Eliquis for [facility]. Continued on po amio on discharge with wean over several weeks.  Admitted 3/27 - 01/01/18 with ICD shock. Electrolytes stable. Low grade fever and volume overload noted. Diuresed 7 lbs and treated with course of ABX. Echo 12/31/17 with LVEF 25-30%.   He was admitted early November 2019 with PNA. He was treated with IV abx. No changes were made to HF medications. He was also treated for strep throat at the end of November.   He was last seen in HF clinic in August. Coreg was restarted and he was supposed to follow up with pharmacy q3 weeks, but only went to one appointment in which bidil was restarted. Overall  doing well. Denies SOB. Staying active. 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 Denies edema, orthopnea, or PND. Back on CPAP qHS. No cough. No fever or chills. No CP. He gets lightheaded in the mornings when he wakes up and 1-2 minutes after taking his medications. Resolves if he takes meclizine. No missed medications. SBP at home 120s. Weights 309 -> 315 lbs. He took a month off exercising, but is back to it. Limits salt. Tries to limit fluid intake but has a hard time.   Echo 12/31/17 LVEF 25-30%, Moderate MR, mild RV dilation, Severe RAE, Mod TR, Pa peak pressure 67 mm Hg.  ECHO 01/16/2016 EF 20-25%  Grade III DD Echo 05/07/15.  EF 20-25%, grade II diastolic dysfunction, diffuse hypokinesis, moderate LV dilation, mild LVH, normal RV size with mildly decreased systolic function.   Echo in 2012 with EF 20-25%.  In 1/13 had nuclear study EF 45-50% suggestive of NICM.   [facility] 05/17/15 RA = 7 RV = 42/3/7 PA = 43/30 (39) PCW = 26 Fick cardiac output/index = 4.9/2.1 PVR = 2.7 WU SVR = 1651 FA sat = 95% PA sat = 65%, 65% Ao = 122/97 (109) LV = 110/31/33 1) Normal coronaries 2) Moderately elevated filling pressures with moderately depressed cardiac output  Review of systems complete and found to be negative unless listed in HPI.    Past Medical History:  Diagnosis Date  . Anxiety   . Aspirin allergy   . Childhood asthma   . Chronic systolic CHF (  congestive heart failure) ([place])    a. EF 20-25% in 2012. b. EF 45-50% in 10/2011 with nonischemic nuc - presumed NICM. c. 12/2014 Echo: Sev depressed LV fxn, sev dil LV, mild LVH, mild MR, sev dil LA, mildly reduced RV fxn.  . CKD (chronic kidney disease) stage 2, GFR 60-89 ml/min   . High cholesterol   . Hypertension   . Morbid obesity ([place])   . Nephrolithiasis   . OSA on CPAP   . Presumed NICM    a. 04/2014 Myoview: EF 26%, glob HK, sev glob HK, ? prior infarct;  b. Never cathed 2/2 CKD.  [name] Renal cell carcinoma ([place])    a. s/p Rt robotic  assisted partial converted to radical nephrectomy on 01/2013.  . Troponin level elevated    a. 04/2014, 12/2014: felt due to CHF.  . Type II diabetes mellitus ([place])   . Ventricular tachycardia ([place])    a. appropriate ICD therapy 12/2017   Past Surgical History:  Procedure Laterality Date  . APPENDECTOMY  07/2004  . CARDIAC CATHETERIZATION N/A 05/17/2015   Procedure: Right/Left Heart Cath and Coronary Angiography;  Surgeon: Jolaine Artist, MD;  Location: [place] [facility];  Service: Cardiovascular;  Laterality: N/A;  . EP IMPLANTABLE DEVICE N/A 05/17/2015   Procedure: SubQ ICD Implant;  Surgeon: Will Meredith Leeds, MD;  Location: [place] [facility];  Service: Cardiovascular;  Laterality: N/A;  . ROBOTIC ASSISTED LAPAROSCOPIC LYSIS OF ADHESION  01/13/2013   Procedure: ROBOTIC ASSISTED LAPAROSCOPIC LYSIS OF ADHESION EXTENSIVE;  Surgeon: Alexis Frock, MD;  [facility];  Service: Urology;;  . ROBOTIC ASSITED PARTIAL NEPHRECTOMY Right 01/13/2013   Procedure: ROBOTIC ASSITED PARTIAL NEPHRECTOMY CONVERTED TO ROBOTIC ASSISTED RIGHT RADICAL NEPHRECTOMY;  Surgeon: Alexis Frock, MD;  [facility];  Service: Urology;  Laterality: Right;    Allergies  Allergies  Allergen Reactions  . Aspirin Shortness Of Breath, Itching and Rash     Burning sensation (Patient reports he tolerates other NSAIDS)   . Bee Venom Hives and Swelling  . Lisinopril Cough  . Tomato Rash     Home Medications Current Outpatient Medications  Medication Sig Dispense Refill  . allopurinol (ZYLOPRIM) 100 MG tablet Take 1 tablet (100 mg total) by mouth daily. 90 tablet 3  . amiodarone (PACERONE) 200 MG tablet Take 200 mg by mouth daily.    [name] apixaban (ELIQUIS) 5 MG TABS tablet Take 1 tablet (5 mg total) by mouth 2 (two) times daily. 60 tablet 10  . atorvastatin (LIPITOR) 80 MG tablet TAKE 1 TABLET (80 MG TOTAL) BY MOUTH EVERY EVENING. 90 tablet 0  . clindamycin (CLEOCIN) 150 MG capsule Take 2 capsules (300 mg  total) by mouth every 6 (six) hours. 56 capsule 0  . gabapentin (NEURONTIN) 300 MG capsule TAKE 1 CAPSULE BY MOUTH 3 TIMES DAILY. 90 capsule 3  . insulin NPH-regular Human (NOVOLIN 70/30) (70-30) 100 UNIT/ML injection Inject 34 Units into the skin 2 (two) times daily with a meal. 60 mL 3  . isosorbide-hydrALAZINE (BIDIL) 20-37.5 MG tablet Take 1 tablet by mouth 3 (three) times daily. 90 tablet 5  . Magnesium Oxide 200 MG TABS Take 100 mg by mouth daily.    . meclizine (ANTIVERT) 25 MG tablet Take 1 tablet (25 mg total) by mouth 3 (three) times daily as needed for dizziness (Take meclizine for spinning dizziness. You may also take Valium if your symptoms are not relieved by the meclizine alone.). 30 tablet 4  . MITIGARE  0.6 MG CAPS Take 1 tablet by mouth as needed (Gout).   3  . potassium chloride (K-DUR) 10 MEQ tablet Take 2 tablets (20 mEq total) by mouth daily. 30 tablet 5  . sacubitril-valsartan (ENTRESTO) 97-103 MG Take 1 tablet by mouth 2 (two) times daily. 180 tablet 3  . torsemide (DEMADEX) 20 MG tablet Take 2 tablets (40 mg total) by mouth daily. 60 tablet 3  . traMADol (ULTRAM) 50 MG tablet Take 1 tablet (50 mg total) by mouth every 6 (six) hours as needed. 30 tablet 0  . Vitamin D, Ergocalciferol, (DRISDOL) 1.25 MG (50000 UT) CAPS capsule Take 50,000 Units by mouth every 30 (thirty) days.  5   No current facility-administered medications for this encounter.    Vitals:   10/11/18 1138  BP: 126/82  Pulse: 86  SpO2: 98%  Weight: (!) 144.8 kg (319 lb 4 oz)   Wt Readings from Last 3 Encounters:  10/11/18 (!) 144.8 kg (319 lb 4 oz)  09/06/18 (!) 143.3 kg (316 lb)  09/01/18 (!) 142.9 kg (315 lb)   Physical Exam General: Well appearing. No resp difficulty. HEENT: Normal Neck: Supple. JVP 6-7. Carotids 2+ bilat; no bruits. No thyromegaly or nodule noted. Cor: PMI nondisplaced. RRR, No M/G/R noted Lungs: CTAB, normal effort. Abdomen: Soft, non-tender, non-distended, no HSM. No  bruits or masses. +BS  Extremities: No cyanosis, clubbing, or rash. R and LLE no edema.  Neuro: Alert & orientedx3, cranial nerves grossly intact. moves all 4 extremities w/o difficulty. Affect pleasant   Assessment & Plan 1.  Chronic systolic congestive heart failure: s/p [employer] subcutaneous ICD implant 05/17/15.  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  - Echo 12/31/17 LVEF 25-30%, Moderate MR, mild RV dilation, Severe RAE, Mod TR, Pa peak pressure 67 mm Hg.  - NYHA I-II - Volume status stable on exam. - Continue torsemide 40 mg daily  - Continue Entresto 97/103 mg BID. Watch closely with solitary kidney. BMET today.  - Continue bidil 1 tab TID - Continue carvedilol 3.125 bid - Continue Imdur 30 mg daily.  - Restart spiro 12.5 mg daily. Creat 1.44, K 3.6 on 09/01/18. BMET today and again in 7-10 days. - Reinforced fluid restriction to < 2 L daily, sodium restriction to less than 2000 mg daily, and the importance of daily weights.   2. Paroxysmal Afib - New diagnosis 06/2017.  - Continue Eliquis 5 mg BID. Denies bleeding.  - Continue amiodarone 200 daily. Check TFTs and LFTs today.  - Will need yearly eye exams. Regular on exam today.  3. VT with ICD shock 12/29/17 - Continue amiodarone 200 mg daily. No syncope or palpitations. 4. Hypertension:  - Stable.  5. CKD stage III - Creatinine stable 1.44 09/01/18. BMET today.  6. OSA:  - Back on CPAP.  7. Tobacco use: - Former smoker. No change.  8. Gout:  - Continue allopurinol 200 daily for prophylaxis. No change.  9. DM2:  - Stable. Per PCP.  10.Renal Cell Carcinoma - S/P nephrectomy 2014.  - BMET. Follow closely in setting of solitary kidney.  - Follows at [facility]. Unsure of his doctor's name.  11. Morbid Obesity - Body mass index is 50 kg/m.  - Encouraged weight loss. He has started exercising again.  CMET, TFTs today Restart spiro 12.5 mg qHS. BMET in 7-10 days Echo in 3 months with Dr [name]  Georgiana Shore,  NP  11:41 AM   Greater than 50% of the 25 minute visit was  spent in counseling/coordination of care regarding disease state education, salt/fluid restriction, sliding scale diuretics, and medication compliance.

## 2018-10-11 ENCOUNTER — Encounter (HOSPITAL_COMMUNITY): Payer: Self-pay

## 2018-10-11 ENCOUNTER — Ambulatory Visit (HOSPITAL_COMMUNITY)
Admission: RE | Admit: 2018-10-11 | Discharge: 2018-10-11 | Disposition: A | Discharge status: Home / Self Care | Payer: Medicaid Other | Source: Ambulatory Visit | Attending: Cardiology | Admitting: Cardiology

## 2018-10-11 ENCOUNTER — Other Ambulatory Visit: Payer: Self-pay

## 2018-10-11 VITALS — BP 126/82 | HR 86 | Wt 319.2 lb

## 2018-10-11 DIAGNOSIS — Z888 Allergy status to other drugs, medicaments and biological substances status: Secondary | ICD-10-CM | POA: Diagnosis not present

## 2018-10-11 DIAGNOSIS — I480 Paroxysmal atrial fibrillation: Secondary | ICD-10-CM | POA: Diagnosis not present

## 2018-10-11 DIAGNOSIS — Z794 Long term (current) use of insulin: Secondary | ICD-10-CM | POA: Diagnosis not present

## 2018-10-11 DIAGNOSIS — G4733 Obstructive sleep apnea (adult) (pediatric): Secondary | ICD-10-CM | POA: Diagnosis not present

## 2018-10-11 DIAGNOSIS — E7800 Pure hypercholesterolemia, unspecified: Secondary | ICD-10-CM | POA: Diagnosis not present

## 2018-10-11 DIAGNOSIS — N183 Chronic kidney disease, stage 3 unspecified: Secondary | ICD-10-CM

## 2018-10-11 DIAGNOSIS — Z79899 Other long term (current) drug therapy: Secondary | ICD-10-CM | POA: Diagnosis not present

## 2018-10-11 DIAGNOSIS — I5022 Chronic systolic (congestive) heart failure: Secondary | ICD-10-CM | POA: Diagnosis not present

## 2018-10-11 DIAGNOSIS — I472 Ventricular tachycardia, unspecified: Secondary | ICD-10-CM

## 2018-10-11 DIAGNOSIS — Z7901 Long term (current) use of anticoagulants: Secondary | ICD-10-CM | POA: Insufficient documentation

## 2018-10-11 DIAGNOSIS — Z85528 Personal history of other malignant neoplasm of kidney: Secondary | ICD-10-CM | POA: Insufficient documentation

## 2018-10-11 DIAGNOSIS — IMO0002 Reserved for concepts with insufficient information to code with codable children: Secondary | ICD-10-CM

## 2018-10-11 DIAGNOSIS — Q600 Renal agenesis, unilateral: Secondary | ICD-10-CM | POA: Diagnosis not present

## 2018-10-11 DIAGNOSIS — Z905 Acquired absence of kidney: Secondary | ICD-10-CM | POA: Diagnosis not present

## 2018-10-11 DIAGNOSIS — Z87891 Personal history of nicotine dependence: Secondary | ICD-10-CM | POA: Insufficient documentation

## 2018-10-11 DIAGNOSIS — I428 Other cardiomyopathies: Secondary | ICD-10-CM | POA: Diagnosis not present

## 2018-10-11 DIAGNOSIS — Z95810 Presence of automatic (implantable) cardiac defibrillator: Secondary | ICD-10-CM | POA: Diagnosis not present

## 2018-10-11 DIAGNOSIS — I130 Hypertensive heart and chronic kidney disease with heart failure and stage 1 through stage 4 chronic kidney disease, or unspecified chronic kidney disease: Secondary | ICD-10-CM | POA: Diagnosis not present

## 2018-10-11 DIAGNOSIS — Z6841 Body Mass Index (BMI) 40.0 and over, adult: Secondary | ICD-10-CM | POA: Insufficient documentation

## 2018-10-11 DIAGNOSIS — Z886 Allergy status to analgesic agent status: Secondary | ICD-10-CM | POA: Diagnosis not present

## 2018-10-11 DIAGNOSIS — E1122 Type 2 diabetes mellitus with diabetic chronic kidney disease: Secondary | ICD-10-CM | POA: Insufficient documentation

## 2018-10-11 DIAGNOSIS — M109 Gout, unspecified: Secondary | ICD-10-CM | POA: Diagnosis not present

## 2018-10-11 LAB — COMPREHENSIVE METABOLIC PANEL
ALT: 53 U/L — ABNORMAL HIGH (ref 0–44)
AST: 51 U/L — ABNORMAL HIGH (ref 15–41)
Albumin: 3.3 g/dL — ABNORMAL LOW (ref 3.5–5.0)
Alkaline Phosphatase: 69 U/L (ref 38–126)
Anion gap: 8 (ref 5–15)
BUN: 26 mg/dL — ABNORMAL HIGH (ref 6–20)
CO2: 24 mmol/L (ref 22–32)
Calcium: 9 mg/dL (ref 8.9–10.3)
Chloride: 103 mmol/L (ref 98–111)
Creatinine, Ser: 1.65 mg/dL — ABNORMAL HIGH (ref 0.61–1.24)
GFR calc Af Amer: 58 mL/min — ABNORMAL LOW (ref >60)
GFR calc non Af Amer: 50 mL/min — ABNORMAL LOW (ref >60)
Glucose, Bld: 370 mg/dL — ABNORMAL HIGH (ref 70–99)
Potassium: 4.3 mmol/L (ref 3.5–5.1)
Sodium: 135 mmol/L (ref 135–145)
TOTAL PROTEIN: 7.4 g/dL (ref 6.5–8.1)
Total Bilirubin: 0.5 mg/dL (ref 0.3–1.2)

## 2018-10-11 LAB — TSH: TSH: 1.559 u[IU]/mL (ref 0.350–4.500)

## 2018-10-11 LAB — T4, FREE: Free T4: 0.93 ng/dL (ref 0.82–1.77)

## 2018-10-11 MED ORDER — SPIRONOLACTONE 25 MG PO TABS: 12.5000 mg | ORAL_TABLET | Freq: Every day | ORAL | 3 refills | Status: DC

## 2018-10-11 NOTE — Patient Instructions (Signed)
Labs done today  Labs need to be done in 1 week  START 12.5mg  (0.5 tab) every evening  Your physician has requested that you have an echocardiogram. Echocardiography is a painless test that uses sound waves to create images of your heart. It provides your doctor with information about the size and shape of your heart and how well your heart's chambers and valves are working. This procedure takes approximately one hour. There are no restrictions for this procedure.  Follow up with Dr. [name] in 3 months

## 2018-10-11 NOTE — Progress Notes (Signed)
Remote ICD transmission.   

## 2018-10-12 LAB — T3, FREE: T3, Free: 2.9 pg/mL (ref 2.0–4.4)

## 2018-10-18 ENCOUNTER — Ambulatory Visit (HOSPITAL_COMMUNITY)
Admission: RE | Admit: 2018-10-18 | Discharge: 2018-10-18 | Disposition: A | Discharge status: Home / Self Care | Payer: Medicaid Other | Source: Ambulatory Visit | Attending: Cardiology | Admitting: Cardiology

## 2018-10-18 DIAGNOSIS — I5022 Chronic systolic (congestive) heart failure: Secondary | ICD-10-CM | POA: Diagnosis not present

## 2018-10-18 LAB — BASIC METABOLIC PANEL
ANION GAP: 8 (ref 5–15)
BUN: 25 mg/dL — ABNORMAL HIGH (ref 6–20)
CO2: 26 mmol/L (ref 22–32)
Calcium: 9.4 mg/dL (ref 8.9–10.3)
Chloride: 104 mmol/L (ref 98–111)
Creatinine, Ser: 1.74 mg/dL — ABNORMAL HIGH (ref 0.61–1.24)
GFR calc Af Amer: 54 mL/min — ABNORMAL LOW (ref >60)
GFR calc non Af Amer: 47 mL/min — ABNORMAL LOW (ref >60)
Glucose, Bld: 396 mg/dL — ABNORMAL HIGH (ref 70–99)
Potassium: 4.1 mmol/L (ref 3.5–5.1)
Sodium: 138 mmol/L (ref 135–145)

## 2018-10-19 MED FILL — MECLIZINE 25 MG TABLET: 25 | 10 days supply | Qty: 30 | Fill #4

## 2018-10-25 ENCOUNTER — Ambulatory Visit: Payer: Medicaid Other | Admitting: Family Medicine

## 2018-10-25 MED FILL — AMIODARONE HCL 200 MG TAB: 200 | 30 days supply | Qty: 30 | Fill #7

## 2018-10-28 MED FILL — ALLOPURINOL 100 MG TABLET: 100 | 30 days supply | Qty: 60 | Fill #0

## 2018-10-31 ENCOUNTER — Telehealth: Payer: Self-pay

## 2018-10-31 DIAGNOSIS — N183 Chronic kidney disease, stage 3 unspecified: Secondary | ICD-10-CM

## 2018-10-31 DIAGNOSIS — Z794 Long term (current) use of insulin: Secondary | ICD-10-CM

## 2018-10-31 DIAGNOSIS — E1122 Type 2 diabetes mellitus with diabetic chronic kidney disease: Secondary | ICD-10-CM

## 2018-10-31 DIAGNOSIS — M109 Gout, unspecified: Secondary | ICD-10-CM

## 2018-10-31 MED ORDER — INSULIN NPH ISOPHANE & REGULAR (70-30) 100 UNIT/ML ~~LOC~~ SUSP: 34.0000 [IU] | Freq: Two times a day (BID) | SUBCUTANEOUS | 3 refills | Status: DC

## 2018-10-31 MED ORDER — ALLOPURINOL 100 MG PO TABS: 100.0000 mg | ORAL_TABLET | Freq: Every day | ORAL | 3 refills | Status: DC

## 2018-10-31 NOTE — Telephone Encounter (Signed)
Medication sent to pharmacy  

## 2018-11-01 MED FILL — HUMULIN 70/30 VIAL: (70-30) 100 | 29 days supply | Qty: 20 | Fill #0

## 2018-11-05 DIAGNOSIS — G4733 Obstructive sleep apnea (adult) (pediatric): Secondary | ICD-10-CM | POA: Diagnosis not present

## 2018-11-09 ENCOUNTER — Telehealth: Payer: Self-pay

## 2018-11-09 DIAGNOSIS — I5022 Chronic systolic (congestive) heart failure: Secondary | ICD-10-CM

## 2018-11-09 MED ORDER — TORSEMIDE 20 MG PO TABS: 40.0000 mg | ORAL_TABLET | Freq: Every day | ORAL | 3 refills | Status: DC

## 2018-11-09 MED FILL — TORSEMIDE 20 MG TABLET: 20 | 30 days supply | Qty: 60 | Fill #0

## 2018-11-09 NOTE — Telephone Encounter (Signed)
Medication sent to pharmacy  

## 2018-11-16 ENCOUNTER — Ambulatory Visit: Payer: Medicaid Other | Admitting: Family Medicine

## 2018-11-21 ENCOUNTER — Encounter: Payer: Self-pay | Admitting: Family Medicine

## 2018-11-21 ENCOUNTER — Ambulatory Visit (INDEPENDENT_AMBULATORY_CARE_PROVIDER_SITE_OTHER): Payer: Medicaid Other | Admitting: Family Medicine

## 2018-11-21 VITALS — BP 112/76 | HR 75 | Temp 98.0°F | Ht 67.0 in | Wt 320.0 lb

## 2018-11-21 DIAGNOSIS — Z794 Long term (current) use of insulin: Secondary | ICD-10-CM | POA: Diagnosis not present

## 2018-11-21 DIAGNOSIS — I10 Essential (primary) hypertension: Secondary | ICD-10-CM

## 2018-11-21 DIAGNOSIS — Z3009 Encounter for other general counseling and advice on contraception: Secondary | ICD-10-CM | POA: Diagnosis not present

## 2018-11-21 DIAGNOSIS — N183 Chronic kidney disease, stage 3 unspecified: Secondary | ICD-10-CM

## 2018-11-21 DIAGNOSIS — E119 Type 2 diabetes mellitus without complications: Secondary | ICD-10-CM

## 2018-11-21 DIAGNOSIS — R42 Dizziness and giddiness: Secondary | ICD-10-CM

## 2018-11-21 DIAGNOSIS — Z6841 Body Mass Index (BMI) 40.0 and over, adult: Secondary | ICD-10-CM

## 2018-11-21 DIAGNOSIS — E1122 Type 2 diabetes mellitus with diabetic chronic kidney disease: Secondary | ICD-10-CM

## 2018-11-21 DIAGNOSIS — Z09 Encounter for follow-up examination after completed treatment for conditions other than malignant neoplasm: Secondary | ICD-10-CM | POA: Diagnosis not present

## 2018-11-21 DIAGNOSIS — E66813 Obesity, class 3: Secondary | ICD-10-CM

## 2018-11-21 LAB — POCT URINALYSIS DIP (MANUAL ENTRY)
Bilirubin, UA: NEGATIVE
Glucose, UA: 250 mg/dL — AB
Ketones, POC UA: NEGATIVE mg/dL
Leukocytes, UA: NEGATIVE
Nitrite, UA: NEGATIVE
Protein Ur, POC: NEGATIVE mg/dL
Spec Grav, UA: 1.01 (ref 1.010–1.025)
Urobilinogen, UA: 0.2 E.U./dL
pH, UA: 6.5 (ref 5.0–8.0)

## 2018-11-21 NOTE — Patient Instructions (Signed)
Calorie Counting for Weight Loss Calories are units of energy. Your body needs a certain amount of calories from food to keep you going throughout the day. When you eat more calories than your body needs, your body stores the extra calories as fat. When you eat fewer calories than your body needs, your body burns fat to get the energy it needs. Calorie counting means keeping track of how many calories you eat and drink each day. Calorie counting can be helpful if you need to lose weight. If you make sure to eat fewer calories than your body needs, you should lose weight. Ask your health care provider what a healthy weight is for you. For calorie counting to work, you will need to eat the right number of calories in a day in order to lose a healthy amount of weight per week. A dietitian can help you determine how many calories you need in a day and will give you suggestions on how to reach your calorie goal.  A healthy amount of weight to lose per week is usually 1-2 lb (0.5-0.9 kg). This usually means that your daily calorie intake should be reduced by 500-750 calories.  Eating 1,200 - 1,500 calories per day can help most women lose weight.  Eating 1,500 - 1,800 calories per day can help most men lose weight. What is my plan? My goal is to have __________ calories per day. If I have this many calories per day, I should lose around __________ pounds per week. What do I need to know about calorie counting? In order to meet your daily calorie goal, you will need to:  Find out how many calories are in each food you would like to eat. Try to do this before you eat.  Decide how much of the food you plan to eat.  Write down what you ate and how many calories it had. Doing this is called keeping a food log. To successfully lose weight, it is important to balance calorie counting with a healthy lifestyle that includes regular activity. Aim for 150 minutes of moderate exercise (such as walking) or 75  minutes of vigorous exercise (such as running) each week. Where do I find calorie information?  The number of calories in a food can be found on a Nutrition Facts label. If a food does not have a Nutrition Facts label, try to look up the calories online or ask your dietitian for help. Remember that calories are listed per serving. If you choose to have more than one serving of a food, you will have to multiply the calories per serving by the amount of servings you plan to eat. For example, the label on a package of bread might say that a serving size is 1 slice and that there are 90 calories in a serving. If you eat 1 slice, you will have eaten 90 calories. If you eat 2 slices, you will have eaten 180 calories. How do I keep a food log? Immediately after each meal, record the following information in your food log:  What you ate. Don't forget to include toppings, sauces, and other extras on the food.  How much you ate. This can be measured in cups, ounces, or number of items.  How many calories each food and drink had.  The total number of calories in the meal. Keep your food log near you, such as in a small notebook in your pocket, or use a mobile app or website. Some programs will calculate   calories for you and show you how many calories you have left for the day to meet your goal. What are some calorie counting tips?   Use your calories on foods and drinks that will fill you up and not leave you hungry: ? Some examples of foods that fill you up are nuts and nut butters, vegetables, lean proteins, and high-fiber foods like whole grains. High-fiber foods are foods with more than 5 g fiber per serving. ? Drinks such as sodas, specialty coffee drinks, alcohol, and juices have a lot of calories, yet do not fill you up.  Eat nutritious foods and avoid empty calories. Empty calories are calories you get from foods or beverages that do not have many vitamins or protein, such as candy, sweets, and  soda. It is better to have a nutritious high-calorie food (such as an avocado) than a food with few nutrients (such as a bag of chips).  Know how many calories are in the foods you eat most often. This will help you calculate calorie counts faster.  Pay attention to calories in drinks. Low-calorie drinks include water and unsweetened drinks.  Pay attention to nutrition labels for "low fat" or "fat free" foods. These foods sometimes have the same amount of calories or more calories than the full fat versions. They also often have added sugar, starch, or salt, to make up for flavor that was removed with the fat.  Find a way of tracking calories that works for you. Get creative. Try different apps or programs if writing down calories does not work for you. What are some portion control tips?  Know how many calories are in a serving. This will help you know how many servings of a certain food you can have.  Use a measuring cup to measure serving sizes. You could also try weighing out portions on a kitchen scale. With time, you will be able to estimate serving sizes for some foods.  Take some time to put servings of different foods on your favorite plates, bowls, and cups so you know what a serving looks like.  Try not to eat straight from a bag or box. Doing this can lead to overeating. Put the amount you would like to eat in a cup or on a plate to make sure you are eating the right portion.  Use smaller plates, glasses, and bowls to prevent overeating.  Try not to multitask (for example, watch TV or use your computer) while eating. If it is time to eat, sit down at a table and enjoy your food. This will help you to know when you are full. It will also help you to be aware of what you are eating and how much you are eating. What are tips for following this plan? Reading food labels  Check the calorie count compared to the serving size. The serving size may be smaller than what you are used to  eating.  Check the source of the calories. Make sure the food you are eating is high in vitamins and protein and low in saturated and trans fats. Shopping  Read nutrition labels while you shop. This will help you make healthy decisions before you decide to purchase your food.  Make a grocery list and stick to it. Cooking  Try to cook your favorite foods in a healthier way. For example, try baking instead of frying.  Use low-fat dairy products. Meal planning  Use more fruits and vegetables. Half of your plate should be fruits   and vegetables. °· Include lean proteins like poultry and fish. °How do I count calories when eating out? °· Ask for smaller portion sizes. °· Consider sharing an entree and sides instead of getting your own entree. °· If you get your own entree, eat only half. Ask for a box at the beginning of your meal and put the rest of your entree in it so you are not tempted to eat it. °· If calories are listed on the menu, choose the lower calorie options. °· Choose dishes that include vegetables, fruits, whole grains, low-fat dairy products, and lean protein. °· Choose items that are boiled, broiled, grilled, or steamed. Stay away from items that are buttered, battered, fried, or served with cream sauce. Items labeled "crispy" are usually fried, unless stated otherwise. °· Choose water, low-fat milk, unsweetened iced tea, or other drinks without added sugar. If you want an alcoholic beverage, choose a lower calorie option such as a glass of wine or light beer. °· Ask for dressings, sauces, and syrups on the side. These are usually high in calories, so you should limit the amount you eat. °· If you want a salad, choose a garden salad and ask for grilled meats. Avoid extra toppings like bacon, cheese, or fried items. Ask for the dressing on the side, or ask for olive oil and vinegar or lemon to use as dressing. °· Estimate how many servings of a food you are given. For example, a serving of  cooked rice is ½ cup or about the size of half a baseball. Knowing serving sizes will help you be aware of how much food you are eating at restaurants. The list below tells you how big or small some common portion sizes are based on everyday objects: °? 1 oz--4 stacked dice. °? 3 oz--1 deck of cards. °? 1 tsp--1 die. °? 1 Tbsp--½ a ping-pong ball. °? 2 Tbsp--1 ping-pong ball. °? ½ cup--½ baseball. °? 1 cup--1 baseball. °Summary °· Calorie counting means keeping track of how many calories you eat and drink each day. If you eat fewer calories than your body needs, you should lose weight. °· A healthy amount of weight to lose per week is usually 1-2 lb (0.5-0.9 kg). This usually means reducing your daily calorie intake by 500-750 calories. °· The number of calories in a food can be found on a Nutrition Facts label. If a food does not have a Nutrition Facts label, try to look up the calories online or ask your dietitian for help. °· Use your calories on foods and drinks that will fill you up, and not on foods and drinks that will leave you hungry. °· Use smaller plates, glasses, and bowls to prevent overeating. °This information is not intended to replace advice given to you by your health care provider. Make sure you discuss any questions you have with your health care provider. °Document Released: 09/21/2005 Document Revised: 06/10/2018 Document Reviewed: 08/21/2016 °[employer] Interactive Patient Education © 2019 [employer] °Heart-Healthy Eating Plan °Heart-healthy meal planning includes: °· Eating less unhealthy fats. °· Eating more healthy fats. °· Making other changes in your diet. °Talk with your doctor or a diet specialist (dietitian) to create an eating plan that is right for you. °What is my plan? °Your doctor may recommend an eating plan that includes: °· Total fat: ______% or less of total calories a day. °· Saturated fat: ______% or less of total calories a day. °· Cholesterol: less than _________mg a  day. °What are   tips for following this plan? °Cooking °Avoid frying your food. Try to bake, boil, grill, or broil it instead. You can also reduce fat by: °· Removing the skin from poultry. °· Removing all visible fats from meats. °· Steaming vegetables in water or broth. °Meal planning ° °· At meals, divide your plate into four equal parts: °? Fill one-half of your plate with vegetables and green salads. °? Fill one-fourth of your plate with whole grains. °? Fill one-fourth of your plate with lean protein foods. °· Eat 4-5 servings of vegetables per day. A serving of vegetables is: °? 1 cup of raw or cooked vegetables. °? 2 cups of raw leafy greens. °· Eat 4-5 servings of fruit per day. A serving of fruit is: °? 1 medium whole fruit. °? ¼ cup of dried fruit. °? ½ cup of fresh, frozen, or canned fruit. °? ½ cup of 100% fruit juice. °· Eat more foods that have soluble fiber. These are apples, broccoli, carrots, beans, peas, and barley. Try to get 20-30 g of fiber per day. °· Eat 4-5 servings of nuts, legumes, and seeds per week: °? 1 serving of dried beans or legumes equals ½ cup after being cooked. °? 1 serving of nuts is ¼ cup. °? 1 serving of seeds equals 1 tablespoon. °General information °· Eat more home-cooked food. Eat less restaurant, buffet, and fast food. °· Limit or avoid alcohol. °· Limit foods that are high in starch and sugar. °· Avoid fried foods. °· Lose weight if you are overweight. °· Keep track of how much salt (sodium) you eat. This is important if you have high blood pressure. Ask your doctor to tell you more about this. °· Try to add vegetarian meals each week. °Fats °· Choose healthy fats. These include olive oil and canola oil, flaxseeds, walnuts, almonds, and seeds. °· Eat more omega-3 fats. These include salmon, mackerel, sardines, tuna, flaxseed oil, and ground flaxseeds. Try to eat fish at least 2 times each week. °· Check food labels. Avoid foods with trans fats or high amounts of  saturated fat. °· Limit saturated fats. °? These are often found in animal products, such as meats, butter, and cream. °? These are also found in plant foods, such as palm oil, palm kernel oil, and coconut oil. °· Avoid foods with partially hydrogenated oils in them. These have trans fats. Examples are stick margarine, some tub margarines, cookies, crackers, and other baked goods. °What foods can I eat? °Fruits °All fresh, canned (in natural juice), or frozen fruits. °Vegetables °Fresh or frozen vegetables (raw, steamed, roasted, or grilled). Green salads. °Grains °Most grains. Choose whole wheat and whole grains most of the time. Rice and pasta, including brown rice and pastas made with whole wheat. °Meats and other proteins °Lean, well-trimmed beef, veal, pork, and lamb. Chicken and turkey without skin. All fish and shellfish. Wild duck, rabbit, pheasant, and venison. Egg whites or low-cholesterol egg substitutes. Dried beans, peas, lentils, and tofu. Seeds and most nuts. °Dairy °Low-fat or nonfat cheeses, including ricotta and mozzarella. Skim or 1% milk that is liquid, powdered, or evaporated. Buttermilk that is made with low-fat milk. Nonfat or low-fat yogurt. °Fats and oils °Non-hydrogenated (trans-free) margarines. Vegetable oils, including soybean, sesame, sunflower, olive, peanut, safflower, corn, canola, and cottonseed. Salad dressings or mayonnaise made with a vegetable oil. °Beverages °Mineral water. Coffee and tea. Diet carbonated beverages. °Sweets and desserts °Sherbet, gelatin, and fruit ice. Small amounts of dark chocolate. °Limit all sweets and desserts. °Seasonings   and condiments °All seasonings and condiments. °The items listed above may not be a complete list of foods and drinks you can eat. Contact a dietitian for more options. °What foods should I avoid? °Fruits °Canned fruit in heavy syrup. Fruit in cream or butter sauce. Fried fruit. Limit coconut. °Vegetables °Vegetables cooked in cheese,  cream, or butter sauce. Fried vegetables. °Grains °Breads that are made with saturated or trans fats, oils, or whole milk. Croissants. Sweet rolls. Donuts. High-fat crackers, such as cheese crackers. °Meats and other proteins °Fatty meats, such as hot dogs, ribs, sausage, bacon, rib-eye roast or steak. High-fat deli meats, such as salami and bologna. Caviar. Domestic duck and goose. Organ meats, such as liver. °Dairy °Cream, sour cream, cream cheese, and creamed cottage cheese. Whole-milk cheeses. Whole or 2% milk that is liquid, evaporated, or condensed. Whole buttermilk. Cream sauce or high-fat cheese sauce. Yogurt that is made from whole milk. °Fats and oils °Meat fat, or shortening. Cocoa butter, hydrogenated oils, palm oil, coconut oil, palm kernel oil. Solid fats and shortenings, including bacon fat, salt pork, lard, and butter. Nondairy cream substitutes. Salad dressings with cheese or sour cream. °Beverages °Regular sodas and juice drinks with added sugar. °Sweets and desserts °Frosting. Pudding. Cookies. Cakes. Pies. Milk chocolate or white chocolate. Buttered syrups. Full-fat ice cream or ice cream drinks. °The items listed above may not be a complete list of foods and drinks to avoid. Contact a dietitian for more information. °Summary °· Heart-healthy meal planning includes eating less unhealthy fats, eating more healthy fats, and making other changes in your diet. °· Eat a balanced diet. This includes fruits and vegetables, low-fat or nonfat dairy, lean protein, nuts and legumes, whole grains, and heart-healthy oils and fats. °This information is not intended to replace advice given to you by your health care provider. Make sure you discuss any questions you have with your health care provider. °Document Released: 03/22/2012 Document Revised: 10/29/2017 Document Reviewed: 10/29/2017 °[employer] Interactive Patient Education © 2019 [employer] °DASH Eating Plan °DASH stands for "Dietary Approaches to Stop  Hypertension." The DASH eating plan is a healthy eating plan that has been shown to reduce high blood pressure (hypertension). It may also reduce your risk for type 2 diabetes, heart disease, and stroke. The DASH eating plan may also help with weight loss. °What are tips for following this plan? ° °General guidelines °· Avoid eating more than 2,300 mg (milligrams) of salt (sodium) a day. If you have hypertension, you may need to reduce your sodium intake to 1,500 mg a day. °· Limit alcohol intake to no more than 1 drink a day for nonpregnant women and 2 drinks a day for men. One drink equals 12 oz of beer, 5 oz of wine, or 1½ oz of hard liquor. °· Work with your health care provider to maintain a healthy body weight or to lose weight. Ask what an ideal weight is for you. °· Get at least 30 minutes of exercise that causes your heart to beat faster (aerobic exercise) most days of the week. Activities may include walking, swimming, or biking. °· Work with your health care provider or diet and nutrition specialist (dietitian) to adjust your eating plan to your individual calorie needs. °Reading food labels ° °· Check food labels for the amount of sodium per serving. Choose foods with less than 5 percent of the Daily Value of sodium. Generally, foods with less than 300 mg of sodium per serving fit into this eating plan. °·   To find whole grains, look for the word "whole" as the first word in the ingredient list. °Shopping °· Buy products labeled as "low-sodium" or "no salt added." °· Buy fresh foods. Avoid canned foods and premade or frozen meals. °Cooking °· Avoid adding salt when cooking. Use salt-free seasonings or herbs instead of table salt or sea salt. Check with your health care provider or pharmacist before using salt substitutes. °· Do not fry foods. Cook foods using healthy methods such as baking, boiling, grilling, and broiling instead. °· Cook with heart-healthy oils, such as olive, canola, soybean, or  sunflower oil. °Meal planning °· Eat a balanced diet that includes: °? 5 or more servings of fruits and vegetables each day. At each meal, try to fill half of your plate with fruits and vegetables. °? Up to 6-8 servings of whole grains each day. °? Less than 6 oz of lean meat, poultry, or fish each day. A 3-oz serving of meat is about the same size as a deck of cards. One egg equals 1 oz. °? 2 servings of low-fat dairy each day. °? A serving of nuts, seeds, or beans 5 times each week. °? Heart-healthy fats. Healthy fats called Omega-3 fatty acids are found in foods such as flaxseeds and coldwater fish, like sardines, salmon, and mackerel. °· Limit how much you eat of the following: °? Canned or prepackaged foods. °? Food that is high in trans fat, such as fried foods. °? Food that is high in saturated fat, such as fatty meat. °? Sweets, desserts, sugary drinks, and other foods with added sugar. °? Full-fat dairy products. °· Do not salt foods before eating. °· Try to eat at least 2 vegetarian meals each week. °· Eat more home-cooked food and less restaurant, buffet, and fast food. °· When eating at a restaurant, ask that your food be prepared with less salt or no salt, if possible. °What foods are recommended? °The items listed may not be a complete list. Talk with your dietitian about what dietary choices are best for you. °Grains °Whole-grain or whole-wheat bread. Whole-grain or whole-wheat pasta. Brown rice. Oatmeal. Quinoa. Bulgur. Whole-grain and low-sodium cereals. Pita bread. Low-fat, low-sodium crackers. Whole-wheat flour tortillas. °Vegetables °Fresh or frozen vegetables (raw, steamed, roasted, or grilled). Low-sodium or reduced-sodium tomato and vegetable juice. Low-sodium or reduced-sodium tomato sauce and tomato paste. Low-sodium or reduced-sodium canned vegetables. °Fruits °All fresh, dried, or frozen fruit. Canned fruit in natural juice (without added sugar). °Meat and other protein foods °Skinless  chicken or turkey. Ground chicken or turkey. Pork with fat trimmed off. Fish and seafood. Egg whites. Dried beans, peas, or lentils. Unsalted nuts, nut butters, and seeds. Unsalted canned beans. Lean cuts of beef with fat trimmed off. Low-sodium, lean deli meat. °Dairy °Low-fat (1%) or fat-free (skim) milk. Fat-free, low-fat, or reduced-fat cheeses. Nonfat, low-sodium ricotta or cottage cheese. Low-fat or nonfat yogurt. Low-fat, low-sodium cheese. °Fats and oils °Soft margarine without trans fats. Vegetable oil. Low-fat, reduced-fat, or light mayonnaise and salad dressings (reduced-sodium). Canola, safflower, olive, soybean, and sunflower oils. Avocado. °Seasoning and other foods °Herbs. Spices. Seasoning mixes without salt. Unsalted popcorn and pretzels. Fat-free sweets. °What foods are not recommended? °The items listed may not be a complete list. Talk with your dietitian about what dietary choices are best for you. °Grains °Baked goods made with fat, such as croissants, muffins, or some breads. Dry pasta or rice meal packs. °Vegetables °Creamed or fried vegetables. Vegetables in a cheese sauce. Regular canned vegetables (not   low-sodium or reduced-sodium). Regular canned tomato sauce and paste (not low-sodium or reduced-sodium). Regular tomato and vegetable juice (not low-sodium or reduced-sodium). Pickles. Olives. °Fruits °Canned fruit in a light or heavy syrup. Fried fruit. Fruit in cream or butter sauce. °Meat and other protein foods °Fatty cuts of meat. Ribs. Fried meat. Bacon. Sausage. Bologna and other processed lunch meats. Salami. Fatback. Hotdogs. Bratwurst. Salted nuts and seeds. Canned beans with added salt. Canned or smoked fish. Whole eggs or egg yolks. Chicken or turkey with skin. °Dairy °Whole or 2% milk, cream, and half-and-half. Whole or full-fat cream cheese. Whole-fat or sweetened yogurt. Full-fat cheese. Nondairy creamers. Whipped toppings. Processed cheese and cheese spreads. °Fats and  oils °Butter. Stick margarine. Lard. Shortening. Ghee. Bacon fat. Tropical oils, such as coconut, palm kernel, or palm oil. °Seasoning and other foods °Salted popcorn and pretzels. Onion salt, garlic salt, seasoned salt, table salt, and sea salt. Worcestershire sauce. Tartar sauce. Barbecue sauce. Teriyaki sauce. Soy sauce, including reduced-sodium. Steak sauce. Canned and packaged gravies. Fish sauce. Oyster sauce. Cocktail sauce. Horseradish that you find on the shelf. Ketchup. Mustard. Meat flavorings and tenderizers. Bouillon cubes. Hot sauce and Tabasco sauce. Premade or packaged marinades. Premade or packaged taco seasonings. Relishes. Regular salad dressings. °Where to find more information: °· National Heart, [employer], and [employer] Institute: www.nhlbi.nih.gov °· American Heart Association: www.heart.org °Summary °· The DASH eating plan is a healthy eating plan that has been shown to reduce high blood pressure (hypertension). It may also reduce your risk for type 2 diabetes, heart disease, and stroke. °· With the DASH eating plan, you should limit salt (sodium) intake to 2,300 mg a day. If you have hypertension, you may need to reduce your sodium intake to 1,500 mg a day. °· When on the DASH eating plan, aim to eat more fresh fruits and vegetables, whole grains, lean proteins, low-fat dairy, and heart-healthy fats. °· Work with your health care provider or diet and nutrition specialist (dietitian) to adjust your eating plan to your individual calorie needs. °This information is not intended to replace advice given to you by your health care provider. Make sure you discuss any questions you have with your health care provider. °Document Released: 09/10/2011 Document Revised: 09/14/2016 Document Reviewed: 09/14/2016 °[employer] Interactive Patient Education © 2019 [employer] ° °

## 2018-11-21 NOTE — Progress Notes (Signed)
[name]  Established Patient Office Visit  Subjective:  Patient ID: Jeffrey [name], male    DOB: July 21, 1976  Age: 43 y.o. MRN: [id number]  CC:  Chief Complaint  Patient presents with  . Follow-up    3 month on chronic condition     HPI Jeffrey [name] is a 43 year old male who presents for follow up.   Past Medical History:  Diagnosis Date  . Anxiety   . Aspirin allergy   . Childhood asthma   . Chronic systolic CHF (congestive heart failure) ([place])    a. EF 20-25% in 2012. b. EF 45-50% in 10/2011 with nonischemic nuc - presumed NICM. c. 12/2014 Echo: Sev depressed LV fxn, sev dil LV, mild LVH, mild MR, sev dil LA, mildly reduced RV fxn.  . CKD (chronic kidney disease) stage 2, GFR 60-89 ml/min   . High cholesterol   . Hypertension   . Morbid obesity ([place])   . Nephrolithiasis   . OSA on CPAP   . Presumed NICM    a. 04/2014 Myoview: EF 26%, glob HK, sev glob HK, ? prior infarct;  b. Never cathed 2/2 CKD.  [name] Renal cell carcinoma ([place])    a. s/p Rt robotic assisted partial converted to radical nephrectomy on 01/2013.  . Troponin level elevated    a. 04/2014, 12/2014: felt due to CHF.  . Type II diabetes mellitus ([place])   . Ventricular tachycardia ([place])    a. appropriate ICD therapy 12/2017   Current Status: Since his last office visit, he is doing well with no complaints. He continues towards his weight loss goal. He wants to begin process of vasectomy evaluation. His normal range of preprandial blood glucose levels are between 80-102. He denies fatigue, frequent urination, blurred vision, excessive hunger, excessive thirst, weight gain, weight loss, and poor wound healing. K+ discontinued by Nephrologist.  He denies visual changes, chest pain, cough, shortness of breath, heart palpitations, and falls. He has occasional headaches and dizziness with position changes. Denies severe headaches, confusion, seizures, double vision, and blurred  vision, nausea and vomiting. He continues to increase activity towards weight.   He denies fevers, chills, fatigue, recent infections, weight loss, and night sweats. No reports of GI problems such as diarrhea, and constipation. He has no reports of blood in stools, dysuria and hematuria. No depression or anxiety reported. He denies pain today.   Past Surgical History:  Procedure Laterality Date  . APPENDECTOMY  07/2004  . CARDIAC CATHETERIZATION N/A 05/17/2015   Procedure: Right/Left Heart Cath and Coronary Angiography;  Surgeon: Jolaine Artist, MD;  Location: [facility];  Service: Cardiovascular;  Laterality: N/A;  . EP IMPLANTABLE DEVICE N/A 05/17/2015   Procedure: SubQ ICD Implant;  Surgeon: Will Meredith Leeds, MD;  Location: [place] [facility] [facility];  Service: Cardiovascular;  Laterality: N/A;  . ROBOTIC ASSISTED LAPAROSCOPIC LYSIS OF ADHESION  01/13/2013   Procedure: ROBOTIC ASSISTED LAPAROSCOPIC LYSIS OF ADHESION EXTENSIVE;  Surgeon: Alexis Frock, MD;  Location: [facility];  Service: Urology;;  . ROBOTIC ASSITED PARTIAL NEPHRECTOMY Right 01/13/2013   Procedure: ROBOTIC ASSITED PARTIAL NEPHRECTOMY CONVERTED TO ROBOTIC ASSISTED RIGHT RADICAL NEPHRECTOMY;  Surgeon: Alexis Frock, MD;  Location: [facility];  Service: Urology;  Laterality: Right;    Family History  Problem Relation Age of Onset  . Hypertension Mother   . Diabetes Maternal Aunt   . Heart attack Neg Hx   . Stroke Neg Hx  Social History   Socioeconomic History  . Marital status: Significant Other    Spouse name: Not on file  . Number of children: Not on file  . Years of education: Not on file  . Highest education level: Not on file  Occupational History  . Not on file  Social Needs  . Financial resource strain: Not on file  . Food insecurity:    Worry: Not on file    Inability: Not on file  . Transportation needs:    Medical: Not on file    Non-medical: Not on file  Tobacco Use  . Smoking status: Former  Smoker    Packs/day: 0.25    Years: 22.00    Pack years: 5.50    Last attempt to quit: 04/16/2015    Years since quitting: 3.6  . Smokeless tobacco: Never Used  Substance and Sexual Activity  . Alcohol use: No    Comment: occ   . Drug use: Yes    Frequency: 14.0 times per week    Types: Marijuana    Comment: uses marijuana twice a day  . Sexual activity: Yes    Birth control/protection: Condom  Lifestyle  . Physical activity:    Days per week: Not on file    Minutes per session: Not on file  . Stress: Not on file  Relationships  . Social connections:    Talks on phone: Not on file    Gets together: Not on file    Attends religious service: Not on file    Active member of club or organization: Not on file    Attends meetings of clubs or organizations: Not on file    Relationship status: Not on file  . Intimate partner violence:    Fear of current or ex partner: Not on file    Emotionally abused: Not on file    Physically abused: Not on file    Forced sexual activity: Not on file  Other Topics Concern  . Not on file  Social History Narrative  . Not on file    Outpatient Medications Prior to Visit  Medication Sig Dispense Refill  . allopurinol (ZYLOPRIM) 100 MG tablet Take 1 tablet (100 mg total) by mouth daily. 90 tablet 3  . amiodarone (PACERONE) 200 MG tablet Take 200 mg by mouth daily.    [name] apixaban (ELIQUIS) 5 MG TABS tablet Take 1 tablet (5 mg total) by mouth 2 (two) times daily. 60 tablet 10  . gabapentin (NEURONTIN) 300 MG capsule TAKE 1 CAPSULE BY MOUTH 3 TIMES DAILY. 90 capsule 3  . insulin NPH-regular Human (70-30) 100 UNIT/ML injection Inject 34 Units into the skin 2 (two) times daily with a meal. 60 mL 3  . isosorbide-hydrALAZINE (BIDIL) 20-37.5 MG tablet Take 1 tablet by mouth 3 (three) times daily. 90 tablet 5  . Magnesium Oxide 200 MG TABS Take 100 mg by mouth daily.    . meclizine (ANTIVERT) 25 MG tablet Take 1 tablet (25 mg total) by mouth 3 (three)  times daily as needed for dizziness (Take meclizine for spinning dizziness. You may also take Valium if your symptoms are not relieved by the meclizine alone.). 30 tablet 4  . MITIGARE 0.6 MG CAPS Take 1 tablet by mouth as needed (Gout).   3  . sacubitril-valsartan (ENTRESTO) 97-103 MG Take 1 tablet by mouth 2 (two) times daily. 180 tablet 3  . torsemide (DEMADEX) 20 MG tablet Take 2 tablets (40 mg total) by mouth daily. [place]  tablet 3  . Vitamin D, Ergocalciferol, (DRISDOL) 1.25 MG (50000 UT) CAPS capsule Take 50,000 Units by mouth every 30 (thirty) days.  5  . atorvastatin (LIPITOR) 80 MG tablet TAKE 1 TABLET (80 MG TOTAL) BY MOUTH EVERY EVENING. (Patient not taking: Reported on 11/21/2018) 90 tablet 0  . carvedilol (COREG) 3.125 MG tablet Take 3.125 mg by mouth 2 (two) times daily.    . clindamycin (CLEOCIN) 150 MG capsule Take 2 capsules (300 mg total) by mouth every 6 (six) hours. 56 capsule 0  . potassium chloride (K-DUR) 10 MEQ tablet Take 2 tablets (20 mEq total) by mouth daily. (Patient not taking: Reported on 11/21/2018) 30 tablet 5  . spironolactone (ALDACTONE) 25 MG tablet Take 0.5 tablets (12.5 mg total) by mouth at bedtime. (Patient not taking: Reported on 11/21/2018) 90 tablet 3  . traMADol (ULTRAM) 50 MG tablet Take 1 tablet (50 mg total) by mouth every 6 (six) hours as needed. (Patient not taking: Reported on 11/21/2018) 30 tablet 0   No facility-administered medications prior to visit.     Allergies  Allergen Reactions  . Aspirin Shortness Of Breath, Itching and Rash     Burning sensation (Patient reports he tolerates other NSAIDS)   . Bee Venom Hives and Swelling  . Lisinopril Cough  . Tomato Rash    ROS Review of Systems  Constitutional: Negative.   HENT: Negative.   Eyes: Negative.   Respiratory: Negative.   Cardiovascular: Negative.   Gastrointestinal: Negative.   Endocrine: Negative.   Genitourinary: Negative.   Musculoskeletal: Negative.   Skin: Negative.     Allergic/Immunologic: Negative.   Neurological: Positive for dizziness and headaches.  Hematological: Negative.   Psychiatric/Behavioral: Negative.    Objective:    Physical Exam  Constitutional: He is oriented to person, place, and time. He appears well-developed and well-nourished.  HENT:  Head: Normocephalic and atraumatic.  Eyes: Conjunctivae are normal.  Neck: Normal range of motion. Neck supple.  Cardiovascular: Normal rate, regular rhythm, normal heart sounds and intact distal pulses.  Pulmonary/Chest: Effort normal and breath sounds normal.  Abdominal: Soft. Bowel sounds are normal. He exhibits distension (Obese).  Musculoskeletal: Normal range of motion.  Neurological: He is alert and oriented to person, place, and time.  Skin: Skin is warm and dry.  Psychiatric: He has a normal mood and affect. His behavior is normal. Judgment and thought content normal.  Nursing note and vitals reviewed.   BP 112/76 (BP Location: Left Arm, Patient Position: Sitting, Cuff Size: Large)   Pulse 75   Temp 98 F (36.7 C) (Oral)   Ht 5\' 7"  (1.702 m)   Wt (!) 320 lb (145.2 kg)   SpO2 95%   BMI 50.12 kg/m  Wt Readings from Last 3 Encounters:  11/21/18 (!) 320 lb (145.2 kg)  10/11/18 (!) 319 lb 4 oz (144.8 kg)  09/06/18 (!) 316 lb (143.3 kg)    Health Maintenance Due  Topic Date Due  . OPHTHALMOLOGY EXAM  11/02/1984  . LIPID PANEL  03/31/2014  . URINE MICROALBUMIN  02/17/2018  . HEMOGLOBIN A1C  10/25/2018  . FOOT EXAM  11/01/2018    There are no preventive care reminders to display for this patient.  Lab Results  Component Value Date   TSH 1.559 10/11/2018   Lab Results  Component Value Date   WBC 14.4 (H) 09/01/2018   HGB 14.7 09/01/2018   HCT 44.7 09/01/2018   MCV 86.5 09/01/2018   PLT 210 09/01/2018  Lab Results  Component Value Date   NA 138 10/18/2018   K 4.1 10/18/2018   CO2 26 10/18/2018   GLUCOSE 396 (H) 10/18/2018   BUN 25 (H) 10/18/2018   CREATININE  1.74 (H) 10/18/2018   BILITOT 0.5 10/11/2018   ALKPHOS 69 10/11/2018   AST 51 (H) 10/11/2018   ALT 53 (H) 10/11/2018   PROT 7.4 10/11/2018   ALBUMIN 3.3 (L) 10/11/2018   CALCIUM 9.4 10/18/2018   ANIONGAP 8 10/18/2018   GFR 58.42 (L) 01/22/2015   Lab Results  Component Value Date   CHOL 142 03/31/2013   Lab Results  Component Value Date   HDL 25.80 (L) 03/31/2013   Lab Results  Component Value Date   LDLCALC 87 03/31/2013   Lab Results  Component Value Date   TRIG 144.0 03/31/2013   Lab Results  Component Value Date   CHOLHDL 6 03/31/2013   Lab Results  Component Value Date   HGBA1C 6.1 (A) 07/25/2018   Assessment & Plan:   1. Type 2 diabetes mellitus with stage 3 chronic kidney disease, with long-term current use of insulin (HCC) Hgb A1c is stable at 6.1. Continue insulin as prescribed.  He will continue to decrease foods/beverages high in sugars and carbs and follow Heart Healthy or DASH diet. Increase physical activity to at least 30 minutes cardio exercise daily.  - POCT urinalysis dipstick - Hemoglobin A1c; Future - Microalbumin, urine - HM Diabetes Foot Exam - CBC with Differential; Future - Comprehensive metabolic panel; Future  2. Essential hypertension Blood pressure is stable at 112/76 today. Continue Amiodarone, Bidil, Carvedilol,  and Estresto as prescribed. He will continue to decrease high sodium intake, excessive alcohol intake, increase potassium intake, smoking cessation, and increase physical activity of at least 30 minutes of cardio activity daily. He will continue to follow Heart Healthy or DASH diet. - POCT urinalysis dipstick - Lipid Panel; Future - CBC with Differential; Future - Comprehensive metabolic panel; Future  3. Class 3 severe obesity due to excess calories with serious comorbidity and body mass index (BMI) of 50.0 to 59.9 in adult [facility] 2) Body mass index is 50.12 kg/m. Goal BMI  is <30. Encouraged efforts to reduce weight include  engaging in physical activity as tolerated with goal of 150 minutes per week. Improve dietary choices and eat a meal regimen consistent with a Mediterranean or DASH diet. Reduce simple carbohydrates. Do not skip meals and eat healthy snacks throughout the day to avoid over-eating at dinner. Set a goal weight loss that is achievable for you.  4. Encounter for diabetic foot exam ([place]) Negative. Foot exam tolerated well. No decreased sensitivity noted upon foot exam. Patient counseled on proper foot hygiene. She is encouraged to exam feet often (daily), using mirror if necessary; keep feet clean and dry (especially between toes), keep feet moistened, wear cotton socks, and avoid wearing open-toed shoes, high-heel shoes, and sandals. Patient verbalized understanding.   5. Vasectomy evaluation - Ambulatory referral to Urology  6. Dizziness He continues to have moderate dizziness. Continue Meclizine as prescribed.   7. Follow up He will follow up in 3 months.   No orders of the defined types were placed in this encounter.  Orders Placed This Encounter  Procedures  . Hemoglobin A1c  . Lipid Panel  . Microalbumin, urine  . CBC with Differential  . Comprehensive metabolic panel  . Ambulatory referral to Urology  . POCT urinalysis dipstick  . HM Diabetes Foot Exam  Referral Orders     Ambulatory referral to Urology   Kathe Becton,  MSN, [address] [place], [place] [address] [phone]    Problem List Items Addressed This Visit      Endocrine   Type 2 diabetes mellitus with stage 3 chronic kidney disease, with long-term current use of insulin ([place]) - Primary (Chronic)   Relevant Orders   POCT urinalysis dipstick (Completed)   Hemoglobin A1c   Microalbumin, urine   HM Diabetes Foot Exam (Completed)   CBC with Differential   Comprehensive metabolic panel     Other   Follow-up exam    Other Visit Diagnoses     Essential hypertension       Relevant Medications   carvedilol (COREG) 3.125 MG tablet   Other Relevant Orders   POCT urinalysis dipstick (Completed)   Lipid Panel   CBC with Differential   Comprehensive metabolic panel   Class 3 severe obesity due to excess calories with serious comorbidity and body mass index (BMI) of 50.0 to 59.9 in adult [facility])       Encounter for diabetic foot exam [facility])       Vasectomy evaluation       Relevant Orders   Ambulatory referral to Urology   Dizziness          No orders of the defined types were placed in this encounter.   Follow-up: No follow-ups on file.    [name], FNP

## 2018-11-22 DIAGNOSIS — E66813 Obesity, class 3: Secondary | ICD-10-CM | POA: Insufficient documentation

## 2018-11-22 DIAGNOSIS — Z09 Encounter for follow-up examination after completed treatment for conditions other than malignant neoplasm: Secondary | ICD-10-CM | POA: Insufficient documentation

## 2018-11-22 DIAGNOSIS — Z6841 Body Mass Index (BMI) 40.0 and over, adult: Secondary | ICD-10-CM | POA: Insufficient documentation

## 2018-11-22 DIAGNOSIS — R42 Dizziness and giddiness: Secondary | ICD-10-CM | POA: Insufficient documentation

## 2018-11-22 LAB — MICROALBUMIN, URINE: Microalbumin, Urine: 19.1 ug/mL

## 2018-11-28 ENCOUNTER — Other Ambulatory Visit: Payer: Medicaid Other

## 2018-11-28 DIAGNOSIS — I10 Essential (primary) hypertension: Secondary | ICD-10-CM | POA: Diagnosis not present

## 2018-11-28 DIAGNOSIS — Z794 Long term (current) use of insulin: Secondary | ICD-10-CM | POA: Diagnosis not present

## 2018-11-28 DIAGNOSIS — E1122 Type 2 diabetes mellitus with diabetic chronic kidney disease: Secondary | ICD-10-CM | POA: Diagnosis not present

## 2018-11-28 DIAGNOSIS — N183 Chronic kidney disease, stage 3 (moderate): Secondary | ICD-10-CM | POA: Diagnosis not present

## 2018-11-29 LAB — COMPREHENSIVE METABOLIC PANEL
ALT: 36 IU/L (ref 0–44)
AST: 27 IU/L (ref 0–40)
Albumin/Globulin Ratio: 1.3 (ref 1.2–2.2)
Albumin: 4.2 g/dL (ref 4.0–5.0)
Alkaline Phosphatase: 93 IU/L (ref 39–117)
BUN/Creatinine Ratio: 16 (ref 9–20)
BUN: 25 mg/dL — ABNORMAL HIGH (ref 6–24)
Bilirubin Total: 0.3 mg/dL (ref 0.0–1.2)
CO2: 24 mmol/L (ref 20–29)
Calcium: 9.7 mg/dL (ref 8.7–10.2)
Chloride: 95 mmol/L — ABNORMAL LOW (ref 96–106)
Creatinine, Ser: 1.54 mg/dL — ABNORMAL HIGH (ref 0.76–1.27)
GFR calc Af Amer: 63 mL/min/{1.73_m2} (ref >59)
GFR calc non Af Amer: 54 mL/min/{1.73_m2} — ABNORMAL LOW (ref >59)
Globulin, Total: 3.2 g/dL (ref 1.5–4.5)
Glucose: 387 mg/dL — ABNORMAL HIGH (ref 65–99)
Potassium: 4.4 mmol/L (ref 3.5–5.2)
Sodium: 135 mmol/L (ref 134–144)
Total Protein: 7.4 g/dL (ref 6.0–8.5)

## 2018-11-29 LAB — CBC WITH DIFFERENTIAL/PLATELET
Basophils Absolute: 0.1 10*3/uL (ref 0.0–0.2)
Basos: 1 %
EOS (ABSOLUTE): 0.3 10*3/uL (ref 0.0–0.4)
Eos: 4 %
Hematocrit: 41.3 % (ref 37.5–51.0)
Hemoglobin: 13.1 g/dL (ref 13.0–17.7)
Immature Grans (Abs): 0 10*3/uL (ref 0.0–0.1)
Immature Granulocytes: 0 %
Lymphocytes Absolute: 2.4 10*3/uL (ref 0.7–3.1)
Lymphs: 38 %
MCH: 27.1 pg (ref 26.6–33.0)
MCHC: 31.7 g/dL (ref 31.5–35.7)
MCV: 85 fL (ref 79–97)
Monocytes Absolute: 0.5 10*3/uL (ref 0.1–0.9)
Monocytes: 8 %
Neutrophils Absolute: 3.2 10*3/uL (ref 1.4–7.0)
Neutrophils: 49 %
Platelets: 322 10*3/uL (ref 150–450)
RBC: 4.84 x10E6/uL (ref 4.14–5.80)
RDW: 13.1 % (ref 11.6–15.4)
WBC: 6.4 10*3/uL (ref 3.4–10.8)

## 2018-11-29 LAB — LIPID PANEL
Chol/HDL Ratio: 7.3 ratio — ABNORMAL HIGH (ref 0.0–5.0)
Cholesterol, Total: 197 mg/dL (ref 100–199)
HDL: 27 mg/dL — ABNORMAL LOW (ref >39)
Triglycerides: 489 mg/dL — ABNORMAL HIGH (ref 0–149)

## 2018-11-29 LAB — HEMOGLOBIN A1C
Est. average glucose Bld gHb Est-mCnc: 289 mg/dL
Hgb A1c MFr Bld: 11.7 % — ABNORMAL HIGH (ref 4.8–5.6)

## 2018-11-30 ENCOUNTER — Telehealth (HOSPITAL_COMMUNITY): Payer: Self-pay

## 2018-11-30 NOTE — Telephone Encounter (Signed)
Medical records requested from Disability determination services. Faxed to [phone]. original request form to be scanned in.

## 2018-12-01 MED FILL — HUMULIN 70/30 VIAL: (70-30) 100 | 29 days supply | Qty: 20 | Fill #1

## 2018-12-01 MED FILL — AMIODARONE HCL 200 MG TAB: 200 | 30 days supply | Qty: 30 | Fill #8

## 2018-12-01 MED FILL — ALLOPURINOL 100 MG TABLET: 100 | 30 days supply | Qty: 60 | Fill #1

## 2018-12-04 DIAGNOSIS — G4733 Obstructive sleep apnea (adult) (pediatric): Secondary | ICD-10-CM | POA: Diagnosis not present

## 2018-12-05 ENCOUNTER — Other Ambulatory Visit: Payer: Self-pay

## 2018-12-05 DIAGNOSIS — R42 Dizziness and giddiness: Secondary | ICD-10-CM

## 2018-12-05 MED ORDER — MECLIZINE HCL 25 MG PO TABS: 25.0000 mg | ORAL_TABLET | Freq: Three times a day (TID) | ORAL | 4 refills | Status: DC | PRN

## 2018-12-05 NOTE — Telephone Encounter (Signed)
Medication printed and will be faxed to pharmacy

## 2018-12-07 ENCOUNTER — Encounter: Payer: Self-pay | Admitting: Family Medicine

## 2018-12-07 ENCOUNTER — Ambulatory Visit (INDEPENDENT_AMBULATORY_CARE_PROVIDER_SITE_OTHER): Payer: Medicaid Other | Admitting: Family Medicine

## 2018-12-07 VITALS — BP 142/96 | HR 70 | Temp 98.1°F | Ht 67.0 in | Wt 309.0 lb

## 2018-12-07 DIAGNOSIS — R42 Dizziness and giddiness: Secondary | ICD-10-CM | POA: Diagnosis not present

## 2018-12-07 DIAGNOSIS — E785 Hyperlipidemia, unspecified: Secondary | ICD-10-CM | POA: Insufficient documentation

## 2018-12-07 DIAGNOSIS — J302 Other seasonal allergic rhinitis: Secondary | ICD-10-CM

## 2018-12-07 DIAGNOSIS — Z09 Encounter for follow-up examination after completed treatment for conditions other than malignant neoplasm: Secondary | ICD-10-CM

## 2018-12-07 DIAGNOSIS — I10 Essential (primary) hypertension: Secondary | ICD-10-CM

## 2018-12-07 MED ORDER — ATORVASTATIN CALCIUM 10 MG PO TABS: 10.0000 mg | ORAL_TABLET | Freq: Every day | ORAL | 3 refills | Status: DC

## 2018-12-07 MED ORDER — CETIRIZINE HCL 10 MG PO TABS: 10.0000 mg | ORAL_TABLET | Freq: Every day | ORAL | 6 refills | Status: DC

## 2018-12-07 MED ORDER — MECLIZINE HCL 25 MG PO TABS: 25.0000 mg | ORAL_TABLET | Freq: Three times a day (TID) | ORAL | 4 refills | Status: DC

## 2018-12-07 MED ORDER — FLUTICASONE PROPIONATE 50 MCG/ACT NA SUSP: 2.0000 | Freq: Every day | NASAL | 6 refills | Status: DC

## 2018-12-07 MED FILL — ATORVASTATIN 10 MG TABLET: 10 | 30 days supply | Qty: 30 | Fill #0

## 2018-12-07 MED FILL — FLUTICASONE PROP 50 MCG SPR: 50 | 30 days supply | Qty: 16 | Fill #0

## 2018-12-07 MED FILL — CETIRIZINE HCL 10 MG TABS: 10 | 30 days supply | Qty: 30 | Fill #0

## 2018-12-07 MED FILL — MECLIZINE 25 MG TABLET: 25 | 10 days supply | Qty: 30 | Fill #0

## 2018-12-07 NOTE — Progress Notes (Signed)
Patient [facility]  Sick Visit  Subjective:  Patient ID: Jeffrey [name], male    DOB: Feb 27, 1976  Age: 43 y.o. MRN: [id number]  CC: No chief complaint on file.   HPI Jeffrey [name] is a 43 year old male who presents for a Sick Visit.  Past Medical History:  Diagnosis Date  . Anxiety   . Aspirin allergy   . Childhood asthma   . Chronic systolic CHF (congestive heart failure) ([place])    a. EF 20-25% in 2012. b. EF 45-50% in 10/2011 with nonischemic nuc - presumed NICM. c. 12/2014 Echo: Sev depressed LV fxn, sev dil LV, mild LVH, mild MR, sev dil LA, mildly reduced RV fxn.  . CKD (chronic kidney disease) stage 2, GFR 60-89 ml/min   . High cholesterol   . Hypertension   . Morbid obesity ([place])   . Nephrolithiasis   . OSA on CPAP   . Presumed NICM    a. 04/2014 Myoview: EF 26%, glob HK, sev glob HK, ? prior infarct;  b. Never cathed 2/2 CKD.  [name] Renal cell carcinoma ([place])    a. s/p Rt robotic assisted partial converted to radical nephrectomy on 01/2013.  . Troponin level elevated    a. 04/2014, 12/2014: felt due to CHF.  . Type II diabetes mellitus ([place])   . Ventricular tachycardia ([place])    a. appropriate ICD therapy 12/2017   Current Status: Since his last office visit, he is doing well with no complaints. He reports recent allergy symptoms. He reports sneezing, sinus pressure, and coughing X 4 days. He denies visual changes, chest pain, cough, shortness of breath, heart palpitations, and falls. He has occasional headaches and dizziness with position changes. Denies severe headaches, confusion, seizures, double vision, and blurred vision, nausea and vomiting.  He denies fevers, chills, fatigue, recent infections, weight loss, and night sweats. He has not had any headaches, visual changes, dizziness, and falls. No chest pain, heart palpitations, cough and shortness of breath reported. No reports of GI problems such as nausea, vomiting, diarrhea, and  constipation. He has no reports of blood in stools, dysuria and hematuria. No depression or anxiety reported. He denies pain today.   Past Surgical History:  Procedure Laterality Date  . APPENDECTOMY  07/2004  . CARDIAC CATHETERIZATION N/A 05/17/2015   Procedure: Right/Left Heart Cath and Coronary Angiography;  Surgeon: Jolaine Artist, MD;  Location: [facility];  Service: Cardiovascular;  Laterality: N/A;  . EP IMPLANTABLE DEVICE N/A 05/17/2015   Procedure: SubQ ICD Implant;  Surgeon: Will Meredith Leeds, MD;  Location: [facility];  Service: Cardiovascular;  Laterality: N/A;  . ROBOTIC ASSISTED LAPAROSCOPIC LYSIS OF ADHESION  01/13/2013   Procedure: ROBOTIC ASSISTED LAPAROSCOPIC LYSIS OF ADHESION EXTENSIVE;  Surgeon: Alexis Frock, MD;  Location: [facility];  Service: Urology;;  . ROBOTIC ASSITED PARTIAL NEPHRECTOMY Right 01/13/2013   Procedure: ROBOTIC ASSITED PARTIAL NEPHRECTOMY CONVERTED TO ROBOTIC ASSISTED RIGHT RADICAL NEPHRECTOMY;  Surgeon: Alexis Frock, MD;  Location: [facility];  Service: Urology;  Laterality: Right;    Family History  Problem Relation Age of Onset  . Hypertension Mother   . Diabetes Maternal Aunt   . Heart attack Neg Hx   . Stroke Neg Hx     Social History   Socioeconomic History  . Marital status: Significant Other    Spouse name: Not on file  . Number of children: Not on file  . Years of  education: Not on file  . Highest education level: Not on file  Occupational History  . Not on file  Social Needs  . Financial resource strain: Not on file  . Food insecurity:    Worry: Not on file    Inability: Not on file  . Transportation needs:    Medical: Not on file    Non-medical: Not on file  Tobacco Use  . Smoking status: Former Smoker    Packs/day: 0.25    Years: 22.00    Pack years: 5.50    Last attempt to quit: 04/16/2015    Years since quitting: 3.6  . Smokeless tobacco: Never Used  Substance and Sexual Activity  . Alcohol use: No     Comment: occ   . Drug use: Yes    Frequency: 14.0 times per week    Types: Marijuana    Comment: uses marijuana twice a day  . Sexual activity: Yes    Birth control/protection: Condom  Lifestyle  . Physical activity:    Days per week: Not on file    Minutes per session: Not on file  . Stress: Not on file  Relationships  . Social connections:    Talks on phone: Not on file    Gets together: Not on file    Attends religious service: Not on file    Active member of club or organization: Not on file    Attends meetings of clubs or organizations: Not on file    Relationship status: Not on file  . Intimate partner violence:    Fear of current or ex partner: Not on file    Emotionally abused: Not on file    Physically abused: Not on file    Forced sexual activity: Not on file  Other Topics Concern  . Not on file  Social History Narrative  . Not on file    Outpatient Medications Prior to Visit  Medication Sig Dispense Refill  . allopurinol (ZYLOPRIM) 100 MG tablet Take 1 tablet (100 mg total) by mouth daily. 90 tablet 3  . amiodarone (PACERONE) 200 MG tablet Take 200 mg by mouth daily.    [name] apixaban (ELIQUIS) 5 MG TABS tablet Take 1 tablet (5 mg total) by mouth 2 (two) times daily. 60 tablet 10  . atorvastatin (LIPITOR) 80 MG tablet TAKE 1 TABLET (80 MG TOTAL) BY MOUTH EVERY EVENING. (Patient not taking: Reported on 11/21/2018) 90 tablet 0  . carvedilol (COREG) 3.125 MG tablet Take 3.125 mg by mouth 2 (two) times daily.    [name] gabapentin (NEURONTIN) 300 MG capsule TAKE 1 CAPSULE BY MOUTH 3 TIMES DAILY. 90 capsule 3  . insulin NPH-regular Human (70-30) 100 UNIT/ML injection Inject 34 Units into the skin 2 (two) times daily with a meal. 60 mL 3  . isosorbide-hydrALAZINE (BIDIL) 20-37.5 MG tablet Take 1 tablet by mouth 3 (three) times daily. 90 tablet 5  . Magnesium Oxide 200 MG TABS Take 100 mg by mouth daily.    . meclizine (ANTIVERT) 25 MG tablet Take 1 tablet (25 mg total) by mouth 3  (three) times daily as needed for dizziness (Take meclizine for spinning dizziness. You may also take Valium if your symptoms are not relieved by the meclizine alone.). 30 tablet 4  . MITIGARE 0.6 MG CAPS Take 1 tablet by mouth as needed (Gout).   3  . sacubitril-valsartan (ENTRESTO) 97-103 MG Take 1 tablet by mouth 2 (two) times daily. 180 tablet 3  . torsemide (DEMADEX) 20  MG tablet Take 2 tablets (40 mg total) by mouth daily. 60 tablet 3  . Vitamin D, Ergocalciferol, (DRISDOL) 1.25 MG (50000 UT) CAPS capsule Take 50,000 Units by mouth every 30 (thirty) days.  5   No facility-administered medications prior to visit.     Allergies  Allergen Reactions  . Aspirin Shortness Of Breath, Itching and Rash     Burning sensation (Patient reports he tolerates other NSAIDS)   . Bee Venom Hives and Swelling  . Lisinopril Cough  . Tomato Rash    ROS Review of Systems  Constitutional: Negative.   HENT: Positive for sinus pressure.   Eyes: Negative.   Respiratory: Positive for cough (Dry).        Sneezing   Cardiovascular: Negative.   Gastrointestinal: Positive for abdominal distention (Obese).  Endocrine: Negative.   Genitourinary: Negative.   Musculoskeletal: Negative.   Skin: Negative.   Allergic/Immunologic: Negative.   Neurological: Positive for dizziness and headaches.  Hematological: Negative.   Psychiatric/Behavioral: Negative.    Objective:    Physical Exam  Constitutional: He is oriented to person, place, and time. He appears well-developed and well-nourished.  HENT:  Head: Normocephalic and atraumatic.  Eyes: Conjunctivae are normal.  Neck: Normal range of motion. Neck supple.  Cardiovascular: Normal rate, regular rhythm, normal heart sounds and intact distal pulses.  Pulmonary/Chest: Effort normal and breath sounds normal.  Abdominal: Soft. Bowel sounds are normal.  Musculoskeletal: Normal range of motion.  Neurological: He is alert and oriented to person, place, and  time. He has normal reflexes.  Skin: Skin is warm and dry.  Psychiatric: He has a normal mood and affect. His behavior is normal. Judgment and thought content normal.  Nursing note and vitals reviewed.   There were no vitals taken for this visit. Wt Readings from Last 3 Encounters:  11/21/18 (!) 320 lb (145.2 kg)  10/11/18 (!) 319 lb 4 oz (144.8 kg)  09/06/18 (!) 316 lb (143.3 kg)     Health Maintenance Due  Topic Date Due  . OPHTHALMOLOGY EXAM  11/02/1984    There are no preventive care reminders to display for this patient.  Lab Results  Component Value Date   TSH 1.559 10/11/2018   Lab Results  Component Value Date   WBC 6.4 11/28/2018   HGB 13.1 11/28/2018   HCT 41.3 11/28/2018   MCV 85 11/28/2018   PLT 322 11/28/2018   Lab Results  Component Value Date   NA 135 11/28/2018   K 4.4 11/28/2018   CO2 24 11/28/2018   GLUCOSE 387 (H) 11/28/2018   BUN 25 (H) 11/28/2018   CREATININE 1.54 (H) 11/28/2018   BILITOT 0.3 11/28/2018   ALKPHOS 93 11/28/2018   AST 27 11/28/2018   ALT 36 11/28/2018   PROT 7.4 11/28/2018   ALBUMIN 4.2 11/28/2018   CALCIUM 9.7 11/28/2018   ANIONGAP 8 10/18/2018   GFR 58.42 (L) 01/22/2015   Lab Results  Component Value Date   CHOL 197 11/28/2018   Lab Results  Component Value Date   HDL 27 (L) 11/28/2018   Lab Results  Component Value Date   LDLCALC Comment 11/28/2018   Lab Results  Component Value Date   TRIG 489 (H) 11/28/2018   Lab Results  Component Value Date   CHOLHDL 7.3 (H) 11/28/2018   Lab Results  Component Value Date   HGBA1C 11.7 (H) 11/28/2018      Assessment & Plan:   1. Seasonal We will initiate Zyrtec today.  -  cetirizine (ZYRTEC) 10 MG tablet; Take 1 tablet (10 mg total) by mouth daily.  Dispense: 30 tablet; Refill: 6 - fluticasone (FLONASE) 50 MCG/ACT nasal spray; Place 2 sprays into both nostrils daily.  Dispense: 16 g; Refill: 6  2. Dizziness - meclizine (ANTIVERT) 25 MG tablet; Take 1 tablet  (25 mg total) by mouth 3 (three) times daily.  Dispense: 30 tablet; Refill: 4  3. Hyperlipidemia, unspecified hyperlipidemia type He will work on diet to lower Triglycerides and we will initiate Atorvastain 10 mg today. - atorvastatin (LIPITOR) 10 MG tablet; Take 1 tablet (10 mg total) by mouth daily.  Dispense: 30 tablet; Refill: 3  4. Essential hypertension Blood pressure 142/96. Continue Carvedilol and Estresto today. She will continue to decrease high sodium intake, excessive alcohol intake, increase potassium intake, smoking cessation, and increase physical activity of at least 30 minutes of cardio activity daily. She will continue to follow Heart Healthy or DASH diet.  5. Follow up He will keep previously scheduled follow.  Meds ordered this encounter  Medications  . cetirizine (ZYRTEC) 10 MG tablet    Sig: Take 1 tablet (10 mg total) by mouth daily.    Dispense:  30 tablet    Refill:  6  . fluticasone (FLONASE) 50 MCG/ACT nasal spray    Sig: Place 2 sprays into both nostrils daily.    Dispense:  16 g    Refill:  6  . meclizine (ANTIVERT) 25 MG tablet    Sig: Take 1 tablet (25 mg total) by mouth 3 (three) times daily.    Dispense:  30 tablet    Refill:  4    As needed for dizziness.  [name] atorvastatin (LIPITOR) 10 MG tablet    Sig: Take 1 tablet (10 mg total) by mouth daily.    Dispense:  30 tablet    Refill:  3    No orders of the defined types were placed in this encounter.   Referral Orders  No referral(s) requested today    Kathe Becton,  MSN, FNP-C Patient [place], [place] [address] [phone]    Problem List Items Addressed This Visit    None      No orders of the defined types were placed in this encounter.   Follow-up: No follow-ups on file.    [name], FNP

## 2018-12-07 NOTE — Patient Instructions (Signed)
Rehydration, Adult Rehydration is the replacement of body fluids and salts and minerals (electrolytes) that are lost during dehydration. Dehydration is when there is not enough fluid or water in the body. This happens when you lose more fluids than you take in. Common causes of dehydration include:  Vomiting.  Diarrhea.  Excessive sweating, such as from heat exposure or exercise.  Taking medicines that cause the body to lose excess fluid (diuretics).  Impaired kidney function.  Not drinking enough fluid.  Certain illnesses or infections.  Certain poorly controlled long-term (chronic) illnesses, such as diabetes, heart disease, and kidney disease.  Symptoms of mild dehydration may include thirst, dry lips and mouth, dry skin, and dizziness. Symptoms of severe dehydration may include increased heart rate, confusion, fainting, and not urinating. You can rehydrate by drinking certain fluids or getting fluids through an IV tube, as told by your health care provider. What are the risks? Generally, rehydration is safe. However, one problem that can happen is taking in too much fluid (overhydration). This is rare. If overhydration happens, it can cause an electrolyte imbalance, kidney failure, or a decrease in salt (sodium) levels in the body. How to rehydrate Follow instructions from your health care provider for rehydration. The kind of fluid you should drink and the amount you should drink depend on your condition.  If directed by your health care provider, drink an oral rehydration solution (ORS). This is a drink designed to treat dehydration that is found in pharmacies and retail stores. ? Make an ORS by following instructions on the package. ? Start by drinking small amounts, about  cup (120 mL) every 5-10 minutes. ? Slowly increase how much you drink until you have taken the amount recommended by your health care provider.  Drink enough clear fluids to keep your urine clear or pale  yellow. If you were instructed to drink an ORS, finish the ORS first, then start slowly drinking other clear fluids. Drink fluids such as: ? Water. Do not drink only water. Doing that can lead to having too little sodium in your body (hyponatremia). ? Ice chips. ? Fruit juice that you have added water to (diluted juice). ? Low-calorie sports drinks.  If you are severely dehydrated, your health care provider may recommend that you receive fluids through an IV tube in the hospital.  Do not take sodium tablets. Doing that can lead to the condition of having too much sodium in your body (hypernatremia). Eating while you rehydrate Follow instructions from your health care provider about what to eat while you rehydrate. Your health care provider may recommend that you slowly begin eating regular foods in small amounts.  Eat foods that contain a healthy balance of electrolytes, such as bananas, oranges, potatoes, tomatoes, and spinach.  Avoid foods that are greasy or contain a lot of fat or sugar.  In some cases, you may get nutrition through a feeding tube that is passed through your nose and into your stomach (nasogastric tube, or NG tube). This may be done if you have uncontrolled vomiting or diarrhea. Beverages to avoid Certain beverages may make dehydration worse. While you rehydrate, avoid:  Alcohol.  Caffeine.  Drinks that contain a lot of sugar. These include: ? High-calorie sports drinks. ? Fruit juice that is not diluted. ? Soda.  Check nutrition labels to see how much sugar or caffeine a beverage contains. Signs of dehydration recovery You may be recovering from dehydration if:  You are urinating more often than before you started   rehydrating.  Your urine is clear or pale yellow.  Your energy level improves.  You vomit less frequently.  You have diarrhea less frequently.  Your appetite improves or returns to normal.  You feel less dizzy or less light-headed.  Your  skin tone and color start to look more normal. Contact a health care provider if:  You continue to have symptoms of mild dehydration, such as: ? Thirst. ? Dry lips. ? Slightly dry mouth. ? Dry, warm skin. ? Dizziness.  You continue to vomit or have diarrhea. Get help right away if:  You have symptoms of dehydration that get worse.  You feel: ? Confused. ? Weak. ? Like you are going to faint.  You have not urinated in 6-8 hours.  You have very dark urine.  You have trouble breathing.  Your heart rate while sitting still is over 100 beats a minute.  You cannot drink fluids without vomiting.  You have vomiting or diarrhea that: ? Gets worse. ? Does not go away.  You have a fever. This information is not intended to replace advice given to you by your health care provider. Make sure you discuss any questions you have with your health care provider. Document Released: 12/14/2011 Document Revised: 04/10/2016 Document Reviewed: 11/15/2015 Elsevier Interactive Patient Education  2019 [employer] Cetirizine tablets What is this medicine? CETIRIZINE (se TI ra zeen) is an antihistamine. This medicine is used to treat or prevent symptoms of allergies. It is also used to help reduce itchy skin rash and hives. This medicine may be used for other purposes; ask your health care provider or pharmacist if you have questions. COMMON BRAND NAME(S): All Day Allergy, Allergy Relief, Zyrtec, Zyrtec Hives Relief What should I tell my health care provider before I take this medicine? They need to know if you have any of these conditions: -kidney disease -liver disease -an unusual or allergic reaction to cetirizine, hydroxyzine, other medicines, foods, dyes, or preservatives -pregnant or trying to get pregnant -breast-feeding How should I use this medicine? Take this medicine by mouth with a glass of water. Follow the directions on the prescription label. You can take this medicine with  food or on an empty stomach. Take your medicine at regular times. Do not take more often than directed. You may need to take this medicine for several days before your symptoms improve. Talk to your pediatrician regarding the use of this medicine in children. Special care may be needed. While this drug may be prescribed for children as young as 72 years of age for selected conditions, precautions do apply. Overdosage: If you think you have taken too much of this medicine contact a poison control center or emergency room at once. NOTE: This medicine is only for you. Do not share this medicine with others. What if I miss a dose? If you miss a dose, take it as soon as you can. If it is almost time for your next dose, take only that dose. Do not take double or extra doses. What may interact with this medicine? -alcohol -certain medicines for anxiety or sleep -narcotic medicines for pain -other medicines for colds or allergies This list may not describe all possible interactions. Give your health care provider a list of all the medicines, herbs, non-prescription drugs, or dietary supplements you use. Also tell them if you smoke, drink alcohol, or use illegal drugs. Some items may interact with your medicine. What should I watch for while using this medicine? Visit your doctor or  health care professional for regular checks on your health. Tell your doctor if your symptoms do not improve. You may get drowsy or dizzy. Do not drive, use machinery, or do anything that needs mental alertness until you know how this medicine affects you. Do not stand or sit up quickly, especially if you are an older patient. This reduces the risk of dizzy or fainting spells. Your mouth may get dry. Chewing sugarless gum or sucking hard candy, and drinking plenty of water may help. Contact your doctor if the problem does not go away or is severe. What side effects may I notice from receiving this medicine? Side effects that you  should report to your doctor or health care professional as soon as possible: -allergic reactions like skin rash, itching or hives, swelling of the face, lips, or tongue -changes in vision or hearing -fast or irregular heartbeat -trouble passing urine or change in the amount of urine Side effects that usually do not require medical attention (report to your doctor or health care professional if they continue or are bothersome): -dizziness -dry mouth -irritability -sore throat -stomach pain -tiredness This list may not describe all possible side effects. Call your doctor for medical advice about side effects. You may report side effects to FDA at 1-800-FDA-1088. Where should I keep my medicine? Keep out of the reach of children. Store at room temperature between 15 and 30 degrees C (59 and 86 degrees F). Throw away any unused medicine after the expiration date. NOTE: This sheet is a summary. It may not cover all possible information. If you have questions about this medicine, talk to your doctor, pharmacist, or health care provider.  2019 [employer] Standard (2014-10-16 13:44:42) Fluticasone nasal spray What is this medicine? FLUTICASONE (floo TIK a sone) is a corticosteroid. This medicine is used to treat the symptoms of allergies like sneezing, itchy red eyes, and itchy, runny, or stuffy nose. This medicine is also used to treat nasal polyps. This medicine may be used for other purposes; ask your health care provider or pharmacist if you have questions. COMMON BRAND NAME(S): ClariSpray, Flonase, Flonase Allergy Relief, Flonase Sensimist, Veramyst, XHANCE What should I tell my health care provider before I take this medicine? They need to know if you have any of these conditions: -eye disease, vision problems -infection, like tuberculosis, herpes, or fungal infection -recent surgery on nose or sinuses -taking a corticosteroid by mouth -an unusual or allergic reaction to fluticasone,  steroids, other medicines, foods, dyes, or preservatives -pregnant or trying to get pregnant -breast-feeding How should I use this medicine? This medicine is for use in the nose. Follow the directions on your product or prescription label. This medicine works best if used at regular intervals. Do not use more often than directed. Make sure that you are using your nasal spray correctly. After 6 months of daily use for allergies, talk to your doctor or health care professional before using it for a longer time. Ask your doctor or health care professional if you have any questions. Talk to your pediatrician regarding the use of this medicine in children. Special care may be needed. Some products have been used for allergies in children as young as 2 years. After 2 months of daily use without a prescription in a child, talk to your pediatrician before using it for a longer time. Use of this medicine for nasal polyps is not approved in children. Overdosage: If you think you have taken too much of this medicine contact a  poison control center or emergency room at once. NOTE: This medicine is only for you. Do not share this medicine with others. What if I miss a dose? If you miss a dose, use it as soon as you remember. If it is almost time for your next dose, use only that dose and continue with your regular schedule. Do not use double or extra doses. What may interact with this medicine? -certain antibiotics like clarithromycin and telithromycin -certain medicines for fungal infections like ketoconazole, itraconazole, and voriconazole -conivaptan -nefazodone -some medicines for HIV -vaccines This list may not describe all possible interactions. Give your health care provider a list of all the medicines, herbs, non-prescription drugs, or dietary supplements you use. Also tell them if you smoke, drink alcohol, or use illegal drugs. Some items may interact with your medicine. What should I watch for while  using this medicine? Visit your healthcare professional for regular checks on your progress. Tell your healthcare professional if your symptoms do not start to get better or if they get worse. This medicine may increase your risk of getting an infection. Tell your doctor or health care professional if you are around anyone with measles or chickenpox, or if you develop sores or blisters that do not heal properly. What side effects may I notice from receiving this medicine? Side effects that you should report to your doctor or health care professional as soon as possible: -allergic reactions like skin rash, itching or hives, swelling of the face, lips, or tongue -changes in vision -crusting or sores in the nose -nosebleed -signs and symptoms of infection like fever or chills; cough; sore throat -white patches or sores in the mouth or nose Side effects that usually do not require medical attention (report to your doctor or health care professional if they continue or are bothersome): -burning or irritation inside the nose or throat -changes in taste or smell -cough -headache This list may not describe all possible side effects. Call your doctor for medical advice about side effects. You may report side effects to FDA at 1-800-FDA-1088. Where should I keep my medicine? Keep out of the reach of children. Store at room temperature between 15 and 30 degrees C (59 and 86 degrees F). Avoid exposure to extreme heat, cold, or light. Throw away any unused medicine after the expiration date. NOTE: This sheet is a summary. It may not cover all possible information. If you have questions about this medicine, talk to your doctor, pharmacist, or health care provider.  2019 [employer] (2017-10-14 14:10:08)

## 2018-12-20 ENCOUNTER — Telehealth: Payer: Self-pay

## 2018-12-22 ENCOUNTER — Other Ambulatory Visit: Payer: Self-pay

## 2018-12-22 DIAGNOSIS — N183 Chronic kidney disease, stage 3 unspecified: Secondary | ICD-10-CM

## 2018-12-22 DIAGNOSIS — E1122 Type 2 diabetes mellitus with diabetic chronic kidney disease: Secondary | ICD-10-CM

## 2018-12-22 DIAGNOSIS — Z794 Long term (current) use of insulin: Principal | ICD-10-CM

## 2018-12-22 MED ORDER — CARVEDILOL 3.125 MG PO TABS: 3.1250 mg | ORAL_TABLET | Freq: Two times a day (BID) | ORAL | 2 refills | Status: DC

## 2018-12-22 MED ORDER — INSULIN NPH ISOPHANE & REGULAR (70-30) 100 UNIT/ML ~~LOC~~ SUSP: 34.0000 [IU] | Freq: Two times a day (BID) | SUBCUTANEOUS | 3 refills | Status: DC

## 2018-12-22 MED FILL — CARVEDILOL 3.125 MG TABLET: 3.125 | 90 days supply | Qty: 180 | Fill #0

## 2018-12-22 MED FILL — HUMULIN 70/30 VIAL: (70-30) 100 | 29 days supply | Qty: 20 | Fill #0

## 2018-12-22 NOTE — Telephone Encounter (Signed)
Patient has been advise that medication refills are at the pharmacy

## 2018-12-23 MED FILL — MECLIZINE 25 MG TABLET: 25 | 10 days supply | Qty: 30 | Fill #1

## 2018-12-23 MED FILL — FLUTICASONE PROP 50 MCG SPR: 50 | 30 days supply | Qty: 16 | Fill #1

## 2018-12-23 MED FILL — GABAPENTIN 300 MG CAPSULE: 300 | 30 days supply | Qty: 90 | Fill #2

## 2018-12-23 MED FILL — AMIODARONE HCL 200 MG TAB: 200 | 30 days supply | Qty: 30 | Fill #9

## 2018-12-23 MED FILL — TORSEMIDE 20 MG TABLET: 20 | 30 days supply | Qty: 60 | Fill #1

## 2018-12-23 MED FILL — BIDIL TABLET: 20-37.5 | 30 days supply | Qty: 90 | Fill #2

## 2018-12-23 MED FILL — CETIRIZINE HCL 10 MG TABS: 10 | 30 days supply | Qty: 30 | Fill #1

## 2018-12-23 MED FILL — ATORVASTATIN 10 MG TABLET: 10 | 30 days supply | Qty: 30 | Fill #1

## 2018-12-23 MED FILL — ALLOPURINOL 100 MG TABLET: 100 | 30 days supply | Qty: 60 | Fill #2

## 2018-12-26 MED FILL — MITIGARE 0.6 MG CAPSULE: 0.6 | 30 days supply | Qty: 60 | Fill #2

## 2018-12-30 ENCOUNTER — Ambulatory Visit (HOSPITAL_COMMUNITY)
Admission: RE | Admit: 2018-12-30 | Discharge: 2018-12-30 | Disposition: A | Discharge status: Home / Self Care | Payer: Medicaid Other | Source: Ambulatory Visit | Attending: Family Medicine | Admitting: Family Medicine

## 2018-12-30 ENCOUNTER — Encounter: Payer: Self-pay | Admitting: Family Medicine

## 2018-12-30 ENCOUNTER — Ambulatory Visit (INDEPENDENT_AMBULATORY_CARE_PROVIDER_SITE_OTHER): Payer: Medicaid Other | Admitting: Family Medicine

## 2018-12-30 ENCOUNTER — Other Ambulatory Visit: Payer: Self-pay

## 2018-12-30 VITALS — BP 134/92 | HR 84 | Temp 98.5°F | Ht 67.0 in | Wt 286.0 lb

## 2018-12-30 DIAGNOSIS — S99922A Unspecified injury of left foot, initial encounter: Secondary | ICD-10-CM | POA: Diagnosis present

## 2018-12-30 DIAGNOSIS — Z09 Encounter for follow-up examination after completed treatment for conditions other than malignant neoplasm: Secondary | ICD-10-CM

## 2018-12-30 DIAGNOSIS — M7989 Other specified soft tissue disorders: Secondary | ICD-10-CM | POA: Insufficient documentation

## 2018-12-30 DIAGNOSIS — R6889 Other general symptoms and signs: Secondary | ICD-10-CM

## 2018-12-30 DIAGNOSIS — R5383 Other fatigue: Secondary | ICD-10-CM | POA: Diagnosis not present

## 2018-12-30 DIAGNOSIS — M791 Myalgia, unspecified site: Secondary | ICD-10-CM

## 2018-12-30 DIAGNOSIS — J029 Acute pharyngitis, unspecified: Secondary | ICD-10-CM

## 2018-12-30 DIAGNOSIS — M79675 Pain in left toe(s): Secondary | ICD-10-CM

## 2018-12-30 DIAGNOSIS — R110 Nausea: Secondary | ICD-10-CM | POA: Diagnosis not present

## 2018-12-30 LAB — POCT RAPID STREP A (OFFICE): Rapid Strep A Screen: NEGATIVE

## 2018-12-30 MED ORDER — ONDANSETRON HCL 4 MG PO TABS: 4.0000 mg | ORAL_TABLET | Freq: Three times a day (TID) | ORAL | 1 refills | Status: DC | PRN

## 2018-12-30 NOTE — Patient Instructions (Signed)
Viral Illness, Adult °Viruses are tiny germs that can get into a person's body and cause illness. There are many different types of viruses, and they cause many types of illness. Viral illnesses can range from mild to severe. They can affect various parts of the body. °Common illnesses that are caused by a virus include colds and the flu. Viral illnesses also include serious conditions such as HIV/AIDS (human immunodeficiency virus/acquired immunodeficiency syndrome). A few viruses have been linked to certain cancers. °What are the causes? °Many types of viruses can cause illness. Viruses invade cells in your body, multiply, and cause the infected cells to malfunction or die. When the cell dies, it releases more of the virus. When this happens, you develop symptoms of the illness, and the virus continues to spread to other cells. If the virus takes over the function of the cell, it can cause the cell to divide and grow out of control, as is the case when a virus causes cancer. °Different viruses get into the body in different ways. You can get a virus by: °· Swallowing food or water that is contaminated with the virus. °· Breathing in droplets that have been coughed or sneezed into the air by an infected person. °· Touching a surface that has been contaminated with the virus and then touching your eyes, nose, or mouth. °· Being bitten by an insect or animal that carries the virus. °· Having sexual contact with a person who is infected with the virus. °· Being exposed to blood or fluids that contain the virus, either through an open cut or during a transfusion. °If a virus enters your body, your body's defense system (immune system) will try to fight the virus. You may be at higher risk for a viral illness if your immune system is weak. °What are the signs or symptoms? °Symptoms vary depending on the type of virus and the location of the cells that it invades. Common symptoms of the main types of viral illnesses  include: °Cold and flu viruses °· Fever. °· Headache. °· Sore throat. °· Muscle aches. °· Nasal congestion. °· Cough. °Digestive system (gastrointestinal) viruses °· Fever. °· Abdominal pain. °· Nausea. °· Diarrhea. °Liver viruses (hepatitis) °· Loss of appetite. °· Tiredness. °· Yellowing of the skin (jaundice). °Brain and spinal cord viruses °· Fever. °· Headache. °· Stiff neck. °· Nausea and vomiting. °· Confusion or sleepiness. °Skin viruses °· Warts. °· Itching. °· Rash. °Sexually transmitted viruses °· Discharge. °· Swelling. °· Redness. °· Rash. °How is this treated? °Viruses can be difficult to treat because they live within cells. Antibiotic medicines do not treat viruses because these drugs do not get inside cells. Treatment for a viral illness may include: °· Resting and drinking plenty of fluids. °· Medicines to relieve symptoms. These can include over-the-counter medicine for pain and fever, medicines for cough or congestion, and medicines to relieve diarrhea. °· Antiviral medicines. These drugs are available only for certain types of viruses. They may help reduce flu symptoms if taken early. There are also many antiviral medicines for hepatitis and HIV/AIDS. °Some viral illnesses can be prevented with vaccinations. A common example is the flu shot. °Follow these instructions at home: °Medicines ° °· Take over-the-counter and prescription medicines only as told by your health care provider. °· If you were prescribed an antiviral medicine, take it as told by your health care provider. Do not stop taking the medicine even if you start to feel better. °· Be aware of when   antibiotics are needed and when they are not needed. Antibiotics do not treat viruses. If your health care provider thinks that you may have a bacterial infection as well as a viral infection, you may get an antibiotic. °? Do not ask for an antibiotic prescription if you have been diagnosed with a viral illness. That will not make your  illness go away faster. °? Frequently taking antibiotics when they are not needed can lead to antibiotic resistance. When this develops, the medicine no longer works against the bacteria that it normally fights. °General instructions °· Drink enough fluids to keep your urine clear or pale yellow. °· Rest as much as possible. °· Return to your normal activities as told by your health care provider. Ask your health care provider what activities are safe for you. °· Keep all follow-up visits as told by your health care provider. This is important. °How is this prevented? °Take these actions to reduce your risk of viral infection: °· Eat a healthy diet and get enough rest. °· Wash your hands often with soap and water. This is especially important when you are in public places. If soap and water are not available, use hand sanitizer. °· Avoid close contact with friends and family who have a viral illness. °· If you travel to areas where viral gastrointestinal infection is common, avoid drinking water or eating raw food. °· Keep your immunizations up to date. Get a flu shot every year as told by your health care provider. °· Do not share toothbrushes, nail clippers, razors, or needles with other people. °· Always practice safe sex. ° °Contact a health care provider if: °· You have symptoms of a viral illness that do not go away. °· Your symptoms come back after going away. °· Your symptoms get worse. °Get help right away if: °· You have trouble breathing. °· You have a severe headache or a stiff neck. °· You have severe vomiting or abdominal pain. °This information is not intended to replace advice given to you by your health care provider. Make sure you discuss any questions you have with your health care provider. °Document Released: 01/31/2016 Document Revised: 03/04/2016 Document Reviewed: 01/31/2016 °[employer] Interactive Patient Education © 2019 [employer] ° °

## 2018-12-30 NOTE — Progress Notes (Signed)
Patient [facility]   Sick Visit  Subjective:  Patient ID: Jeffrey [name], male    DOB: 09-19-1976  Age: 43 y.o. MRN: [id number]  CC:  Chief Complaint  Patient presents with  . Generalized Body Aches  . Sore Throat  . Chills    HPI Jeffrey [name] is a 43 year old male who presents for Sick Visit today.   Past Medical History:  Diagnosis Date  . Anxiety   . Aspirin allergy   . Childhood asthma   . Chronic systolic CHF (congestive heart failure) ([place])    a. EF 20-25% in 2012. b. EF 45-50% in 10/2011 with nonischemic nuc - presumed NICM. c. 12/2014 Echo: Sev depressed LV fxn, sev dil LV, mild LVH, mild MR, sev dil LA, mildly reduced RV fxn.  . CKD (chronic kidney disease) stage 2, GFR 60-89 ml/min   . High cholesterol   . Hypertension   . Morbid obesity ([place])   . Nephrolithiasis   . OSA on CPAP   . Presumed NICM    a. 04/2014 Myoview: EF 26%, glob HK, sev glob HK, ? prior infarct;  b. Never cathed 2/2 CKD.  [name] Renal cell carcinoma ([place])    a. s/p Rt robotic assisted partial converted to radical nephrectomy on 01/2013.  . Troponin level elevated    a. 04/2014, 12/2014: felt due to CHF.  . Type II diabetes mellitus ([place])   . Ventricular tachycardia ([place])    a. appropriate ICD therapy 12/2017   Current Status: Since his last office visit, he denies visual changes, chest pain, cough, shortness of breath, heart palpitations, and falls. She has occasional headaches and dizziness with position changes. Denies severe headaches, confusion, seizures, double vision, and blurred vision, and vomiting. He states that he has had increase fatigue and body aches X 2 weeks. He has been successful in his effort towards weight loss. He has increased his activities and is now 'juicing.' His appetite has decreased and he has nausea. He had a heavy object onto left foot 1 week ago, and continues to have pain, swelling, and bruising to left great toe and left 2nd toes. He  has not used any medications to help with symptoms. His anxiety is mild today. He denies suicidal ideations, homicidal ideations, or auditory hallucinations.  He denies fevers, chills, fatigue, recent infections, weight loss, and night sweats. No reports of GI problems such as diarrhea, and constipation. He has no reports of blood in stools, dysuria and hematuria.   Past Surgical History:  Procedure Laterality Date  . APPENDECTOMY  07/2004  . CARDIAC CATHETERIZATION N/A 05/17/2015   Procedure: Right/Left Heart Cath and Coronary Angiography;  Surgeon: Jolaine Artist, MD;  Location: [facility];  Service: Cardiovascular;  Laterality: N/A;  . EP IMPLANTABLE DEVICE N/A 05/17/2015   Procedure: SubQ ICD Implant;  Surgeon: Will Meredith Leeds, MD;  Location: [facility];  Service: Cardiovascular;  Laterality: N/A;  . ROBOTIC ASSISTED LAPAROSCOPIC LYSIS OF ADHESION  01/13/2013   Procedure: ROBOTIC ASSISTED LAPAROSCOPIC LYSIS OF ADHESION EXTENSIVE;  Surgeon: Alexis Frock, MD;  Location: [facility];  Service: Urology;;  . ROBOTIC ASSITED PARTIAL NEPHRECTOMY Right 01/13/2013   Procedure: ROBOTIC ASSITED PARTIAL NEPHRECTOMY CONVERTED TO ROBOTIC ASSISTED RIGHT RADICAL NEPHRECTOMY;  Surgeon: Alexis Frock, MD;  Location: [facility];  Service: Urology;  Laterality: Right;    Family History  Problem Relation Age of Onset  . Hypertension Mother   .  Diabetes Maternal Aunt   . Heart attack Neg Hx   . Stroke Neg Hx     Social History   Socioeconomic History  . Marital status: Significant Other    Spouse name: Not on file  . Number of children: Not on file  . Years of education: Not on file  . Highest education level: Not on file  Occupational History  . Not on file  Social Needs  . Financial resource strain: Not on file  . Food insecurity:    Worry: Not on file    Inability: Not on file  . Transportation needs:    Medical: Not on file    Non-medical: Not on file  Tobacco Use  .  Smoking status: Former Smoker    Packs/day: 0.25    Years: 22.00    Pack years: 5.50    Last attempt to quit: 04/16/2015    Years since quitting: 3.7  . Smokeless tobacco: Never Used  Substance and Sexual Activity  . Alcohol use: No    Comment: occ   . Drug use: Yes    Frequency: 14.0 times per week    Types: Marijuana    Comment: uses marijuana twice a day  . Sexual activity: Yes    Birth control/protection: Condom  Lifestyle  . Physical activity:    Days per week: Not on file    Minutes per session: Not on file  . Stress: Not on file  Relationships  . Social connections:    Talks on phone: Not on file    Gets together: Not on file    Attends religious service: Not on file    Active member of club or organization: Not on file    Attends meetings of clubs or organizations: Not on file    Relationship status: Not on file  . Intimate partner violence:    Fear of current or ex partner: Not on file    Emotionally abused: Not on file    Physically abused: Not on file    Forced sexual activity: Not on file  Other Topics Concern  . Not on file  Social History Narrative  . Not on file    Outpatient Medications Prior to Visit  Medication Sig Dispense Refill  . allopurinol (ZYLOPRIM) 100 MG tablet Take 1 tablet (100 mg total) by mouth daily. 90 tablet 3  . amiodarone (PACERONE) 200 MG tablet Take 200 mg by mouth daily.    [name] apixaban (ELIQUIS) 5 MG TABS tablet Take 1 tablet (5 mg total) by mouth 2 (two) times daily. 60 tablet 10  . atorvastatin (LIPITOR) 10 MG tablet Take 1 tablet (10 mg total) by mouth daily. 30 tablet 3  . carvedilol (COREG) 3.125 MG tablet Take 1 tablet (3.125 mg total) by mouth 2 (two) times daily. 60 tablet 2  . cetirizine (ZYRTEC) 10 MG tablet Take 1 tablet (10 mg total) by mouth daily. 30 tablet 6  . fluticasone (FLONASE) 50 MCG/ACT nasal spray Place 2 sprays into both nostrils daily. 16 g 6  . gabapentin (NEURONTIN) 300 MG capsule TAKE 1 CAPSULE BY MOUTH  3 TIMES DAILY. 90 capsule 3  . insulin NPH-regular Human (70-30) 100 UNIT/ML injection Inject 34 Units into the skin 2 (two) times daily with a meal. 60 mL 3  . isosorbide-hydrALAZINE (BIDIL) 20-37.5 MG tablet Take 1 tablet by mouth 3 (three) times daily. 90 tablet 5  . Magnesium Oxide 200 MG TABS Take 100 mg by mouth daily.    [name]  meclizine (ANTIVERT) 25 MG tablet Take 1 tablet (25 mg total) by mouth 3 (three) times daily. 30 tablet 4  . MITIGARE 0.6 MG CAPS Take 1 tablet by mouth as needed (Gout).   3  . sacubitril-valsartan (ENTRESTO) 97-103 MG Take 1 tablet by mouth 2 (two) times daily. 180 tablet 3  . torsemide (DEMADEX) 20 MG tablet Take 2 tablets (40 mg total) by mouth daily. 60 tablet 3  . Vitamin D, Ergocalciferol, (DRISDOL) 1.25 MG (50000 UT) CAPS capsule Take 50,000 Units by mouth every 30 (thirty) days.  5   No facility-administered medications prior to visit.     Allergies  Allergen Reactions  . Aspirin Shortness Of Breath, Itching and Rash     Burning sensation (Patient reports he tolerates other NSAIDS)   . Bee Venom Hives and Swelling  . Lisinopril Cough  . Tomato Rash    ROS Review of Systems  Constitutional: Positive for fatigue (increased).  HENT: Positive for sore throat.   Eyes: Negative.   Respiratory: Negative.   Cardiovascular: Negative.   Gastrointestinal: Positive for abdominal distention (Obese) and nausea.  Endocrine: Negative.   Genitourinary: Negative.   Musculoskeletal: Negative.        Body aches.  Left great and 2nd toe pain, swelling, bruising.   Skin: Negative.   Allergic/Immunologic: Negative.   Neurological: Positive for dizziness and headaches.  Hematological: Negative.   Psychiatric/Behavioral: Negative.    Objective:    Physical Exam  Constitutional: He is oriented to person, place, and time. He appears well-developed and well-nourished.  HENT:  Head: Normocephalic and atraumatic.  Eyes: Conjunctivae are normal.  Neck: Normal range  of motion. Neck supple.  Cardiovascular: Normal rate, regular rhythm, normal heart sounds and intact distal pulses.  Pulmonary/Chest: Effort normal and breath sounds normal.  Abdominal: Soft. Bowel sounds are normal.  Musculoskeletal: Normal range of motion.  Neurological: He is alert and oriented to person, place, and time. He has normal reflexes.  Skin: Skin is warm and dry.  Psychiatric: He has a normal mood and affect. His behavior is normal. Judgment and thought content normal.  Nursing note and vitals reviewed.   BP (!) 134/92 (BP Location: Right Arm, Patient Position: Sitting, Cuff Size: Large)   Pulse 84   Temp 98.5 F (36.9 C) (Oral)   Ht 5\' 7"  (1.702 m)   Wt 286 lb (129.7 kg)   SpO2 98%   BMI 44.79 kg/m  Wt Readings from Last 3 Encounters:  12/30/18 286 lb (129.7 kg)  12/07/18 (!) 309 lb (140.2 kg)  11/21/18 (!) 320 lb (145.2 kg)   Health Maintenance Due  Topic Date Due  . OPHTHALMOLOGY EXAM  11/02/1984    There are no preventive care reminders to display for this patient.  Lab Results  Component Value Date   TSH 1.559 10/11/2018   Lab Results  Component Value Date   WBC 6.4 11/28/2018   HGB 13.1 11/28/2018   HCT 41.3 11/28/2018   MCV 85 11/28/2018   PLT 322 11/28/2018   Lab Results  Component Value Date   NA 135 11/28/2018   K 4.4 11/28/2018   CO2 24 11/28/2018   GLUCOSE 387 (H) 11/28/2018   BUN 25 (H) 11/28/2018   CREATININE 1.54 (H) 11/28/2018   BILITOT 0.3 11/28/2018   ALKPHOS 93 11/28/2018   AST 27 11/28/2018   ALT 36 11/28/2018   PROT 7.4 11/28/2018   ALBUMIN 4.2 11/28/2018   CALCIUM 9.7 11/28/2018   ANIONGAP 8  10/18/2018   GFR 58.42 (L) 01/22/2015   Lab Results  Component Value Date   CHOL 197 11/28/2018   Lab Results  Component Value Date   HDL 27 (L) 11/28/2018   Lab Results  Component Value Date   LDLCALC Comment 11/28/2018   Lab Results  Component Value Date   TRIG 489 (H) 11/28/2018   Lab Results  Component Value  Date   CHOLHDL 7.3 (H) 11/28/2018   Lab Results  Component Value Date   HGBA1C 11.7 (H) 11/28/2018      Assessment & Plan:   1. Flu-like symptoms He will take OTC Cold/Flu medication as directed, increase fluids, take Acetaminophen/Motrin as needed, throat lozenges and continue soft diet for sore throat, and get plenty of rest.   2. Sore throat Negative strep test today.  - Culture, Group A Strep - POCT rapid strep A  3. Fatigue, unspecified type  4. Generalized muscle ache He will use Motrin and Acetaminophen sparely for muscle aches. He will begin Nyquil daily as directed.   5. Nausea We will initiate Zofran today.  - ondansetron (ZOFRAN) 4 MG tablet; Take 1 tablet (4 mg total) by mouth every 8 (eight) hours as needed for nausea or vomiting.  Dispense: 20 tablet; Refill: 1  6. Injury of toe on left foot, initial encounter - DG Foot Complete Left; Future  7. Pain and swelling of toe of left foot - DG Foot Complete Left; Future  8. Follow up He will keep follow up appointment.   Meds ordered this encounter  Medications  . ondansetron (ZOFRAN) 4 MG tablet    Sig: Take 1 tablet (4 mg total) by mouth every 8 (eight) hours as needed for nausea or vomiting.    Dispense:  20 tablet    Refill:  1   Orders Placed This Encounter  Procedures  . Culture, Group A Strep  . DG Foot Complete Left  . POCT rapid strep A    Referral Orders  No referral(s) requested today    Kathe Becton,  MSN, FNP-C Patient [place], [place] [address] [phone]   Problem List Items Addressed This Visit    None    Visit Diagnoses    Flu-like symptoms    -  Primary   Sore throat       Relevant Orders   Culture, Group A Strep   POCT rapid strep A (Completed)   Fatigue, unspecified type       Generalized muscle ache       Nausea       Relevant Medications   ondansetron (ZOFRAN) 4 MG tablet   Injury of toe on left foot,  initial encounter       Relevant Orders   DG Foot Complete Left (Completed)   Pain and swelling of toe of left foot       Relevant Orders   DG Foot Complete Left (Completed)   Follow up          Meds ordered this encounter  Medications  . ondansetron (ZOFRAN) 4 MG tablet    Sig: Take 1 tablet (4 mg total) by mouth every 8 (eight) hours as needed for nausea or vomiting.    Dispense:  20 tablet    Refill:  1    Follow-up: No follow-ups on file.    [name], FNP

## 2019-01-02 LAB — CULTURE, GROUP A STREP

## 2019-01-03 ENCOUNTER — Other Ambulatory Visit: Payer: Self-pay | Admitting: Family Medicine

## 2019-01-03 DIAGNOSIS — B955 Unspecified streptococcus as the cause of diseases classified elsewhere: Secondary | ICD-10-CM

## 2019-01-03 MED ORDER — AMOXICILLIN-POT CLAVULANATE 875-125 MG PO TABS: 1.0000 | ORAL_TABLET | Freq: Two times a day (BID) | ORAL | 0 refills | Status: AC

## 2019-01-03 MED FILL — AMOX-CLAV 875-125 MG TABLET: 875-125 | 7 days supply | Qty: 14 | Fill #0

## 2019-01-04 DIAGNOSIS — G4733 Obstructive sleep apnea (adult) (pediatric): Secondary | ICD-10-CM | POA: Diagnosis not present

## 2019-01-04 MED FILL — ONDANSETRON HCL 4 MG TABLET: 4 | 8 days supply | Qty: 20 | Fill #0

## 2019-01-06 ENCOUNTER — Telehealth: Payer: Self-pay | Admitting: Internal Medicine

## 2019-01-06 ENCOUNTER — Telehealth: Payer: Self-pay | Admitting: Family Medicine

## 2019-01-06 ENCOUNTER — Other Ambulatory Visit: Payer: Self-pay | Admitting: Family Medicine

## 2019-01-06 NOTE — Telephone Encounter (Signed)
Telephone conversation from earlier this evening and recent PCP note appreciated.  Patient called wanting to inform Dr. Sung Amabile that he has has a few weeks of sore throat, fatigue, and dry mouth. No fever, no myalgia, no shortness of breath. He was recently seen by his PCP for these symptoms, who prescribed him a course of antibiotics which he has not yet completed. He relayed his ongoing symptoms to her as well in previous telephone encounter. He has been able to take all cardiac medications appropriately. I told him that we would defer to his PCPs recommendations since he had been seen recently. I counseled him on worsening symptoms that should prompt a return phone call including SOB, fever, chest pain. I informed him that I would relay his message to his primary cardiologist Dr. Sung Amabile.   Milus Banister, MD

## 2019-01-06 NOTE — Telephone Encounter (Signed)
Received call from patient concerning throat discomfort. He began Augmentin 2 days ago. He denies fevers, chills, fatigue, recent infections, weight loss, and night sweats. He has not had any headaches, visual changes, dizziness, and falls. No chest pain, heart palpitations, cough and shortness of breath reported. No reports of GI problems such as nausea, vomiting, and diarrhea. Advised patient to use throat lozenges, gargle with warm salt water, drink Ginger Ale, and eat soft foods. He is to continue antibiotic as prescribed. He is to report to office if symptoms do not improve or worsen. Patient verbalized understanding.

## 2019-01-07 NOTE — Telephone Encounter (Signed)
Thank you :)

## 2019-01-09 ENCOUNTER — Ambulatory Visit (INDEPENDENT_AMBULATORY_CARE_PROVIDER_SITE_OTHER): Payer: Medicaid Other | Admitting: *Deleted

## 2019-01-09 ENCOUNTER — Other Ambulatory Visit: Payer: Self-pay

## 2019-01-09 ENCOUNTER — Other Ambulatory Visit: Payer: Self-pay | Admitting: Family Medicine

## 2019-01-09 DIAGNOSIS — I472 Ventricular tachycardia, unspecified: Secondary | ICD-10-CM

## 2019-01-09 DIAGNOSIS — Z794 Long term (current) use of insulin: Principal | ICD-10-CM

## 2019-01-09 DIAGNOSIS — N183 Chronic kidney disease, stage 3 unspecified: Secondary | ICD-10-CM

## 2019-01-09 DIAGNOSIS — E1122 Type 2 diabetes mellitus with diabetic chronic kidney disease: Secondary | ICD-10-CM

## 2019-01-09 LAB — CUP PACEART REMOTE DEVICE CHECK
Battery Remaining Percentage: 58 %
Date Time Interrogation Session: 20200406122553
Implantable Lead Implant Date: 20160812
Implantable Lead Location: 753862
Implantable Lead Model: 3401
Implantable Pulse Generator Implant Date: 20160812
Pulse Gen Serial Number: 117162

## 2019-01-09 MED ORDER — BLOOD GLUCOSE METER KIT: PACK | 0 refills | Status: DC

## 2019-01-12 MED FILL — ELIQUIS 5 MG TABLET: 5 | 30 days supply | Qty: 60 | Fill #2

## 2019-01-12 MED FILL — ENTRESTO 97 MG-103 MG TAB: 97-103 | 30 days supply | Qty: 60 | Fill #2

## 2019-01-16 ENCOUNTER — Encounter: Payer: Self-pay | Admitting: Cardiology

## 2019-01-16 NOTE — Progress Notes (Signed)
Remote ICD transmission.   

## 2019-01-18 ENCOUNTER — Telehealth: Payer: Self-pay

## 2019-01-19 ENCOUNTER — Telehealth: Payer: Self-pay

## 2019-01-19 ENCOUNTER — Emergency Department (HOSPITAL_COMMUNITY): Payer: Medicaid Other

## 2019-01-19 ENCOUNTER — Inpatient Hospital Stay (HOSPITAL_COMMUNITY): Payer: Medicaid Other

## 2019-01-19 ENCOUNTER — Inpatient Hospital Stay (HOSPITAL_COMMUNITY)
Admission: EM | Admit: 2019-01-19 | Discharge: 2019-01-23 | DRG: 638 | Disposition: A | Discharge status: Home / Self Care | Payer: Medicaid Other | Attending: Internal Medicine | Admitting: Internal Medicine

## 2019-01-19 ENCOUNTER — Encounter (HOSPITAL_COMMUNITY): Payer: Self-pay | Admitting: Emergency Medicine

## 2019-01-19 ENCOUNTER — Other Ambulatory Visit: Payer: Self-pay

## 2019-01-19 DIAGNOSIS — I130 Hypertensive heart and chronic kidney disease with heart failure and stage 1 through stage 4 chronic kidney disease, or unspecified chronic kidney disease: Secondary | ICD-10-CM | POA: Diagnosis present

## 2019-01-19 DIAGNOSIS — I480 Paroxysmal atrial fibrillation: Secondary | ICD-10-CM | POA: Diagnosis present

## 2019-01-19 DIAGNOSIS — N179 Acute kidney failure, unspecified: Secondary | ICD-10-CM

## 2019-01-19 DIAGNOSIS — R739 Hyperglycemia, unspecified: Secondary | ICD-10-CM

## 2019-01-19 DIAGNOSIS — E7800 Pure hypercholesterolemia, unspecified: Secondary | ICD-10-CM | POA: Diagnosis present

## 2019-01-19 DIAGNOSIS — Z91030 Bee allergy status: Secondary | ICD-10-CM

## 2019-01-19 DIAGNOSIS — Z905 Acquired absence of kidney: Secondary | ICD-10-CM

## 2019-01-19 DIAGNOSIS — B370 Candidal stomatitis: Secondary | ICD-10-CM | POA: Diagnosis present

## 2019-01-19 DIAGNOSIS — Z87891 Personal history of nicotine dependence: Secondary | ICD-10-CM

## 2019-01-19 DIAGNOSIS — I959 Hypotension, unspecified: Secondary | ICD-10-CM | POA: Diagnosis not present

## 2019-01-19 DIAGNOSIS — Z85528 Personal history of other malignant neoplasm of kidney: Secondary | ICD-10-CM | POA: Diagnosis not present

## 2019-01-19 DIAGNOSIS — R0602 Shortness of breath: Secondary | ICD-10-CM | POA: Diagnosis not present

## 2019-01-19 DIAGNOSIS — K921 Melena: Secondary | ICD-10-CM | POA: Diagnosis present

## 2019-01-19 DIAGNOSIS — I420 Dilated cardiomyopathy: Secondary | ICD-10-CM | POA: Diagnosis not present

## 2019-01-19 DIAGNOSIS — E860 Dehydration: Secondary | ICD-10-CM | POA: Diagnosis not present

## 2019-01-19 DIAGNOSIS — F419 Anxiety disorder, unspecified: Secondary | ICD-10-CM | POA: Diagnosis present

## 2019-01-19 DIAGNOSIS — Z7901 Long term (current) use of anticoagulants: Secondary | ICD-10-CM

## 2019-01-19 DIAGNOSIS — J301 Allergic rhinitis due to pollen: Secondary | ICD-10-CM | POA: Diagnosis present

## 2019-01-19 DIAGNOSIS — G4733 Obstructive sleep apnea (adult) (pediatric): Secondary | ICD-10-CM | POA: Diagnosis not present

## 2019-01-19 DIAGNOSIS — C649 Malignant neoplasm of unspecified kidney, except renal pelvis: Secondary | ICD-10-CM | POA: Diagnosis not present

## 2019-01-19 DIAGNOSIS — K5900 Constipation, unspecified: Secondary | ICD-10-CM | POA: Diagnosis not present

## 2019-01-19 DIAGNOSIS — J020 Streptococcal pharyngitis: Secondary | ICD-10-CM | POA: Diagnosis present

## 2019-01-19 DIAGNOSIS — E1165 Type 2 diabetes mellitus with hyperglycemia: Secondary | ICD-10-CM | POA: Diagnosis not present

## 2019-01-19 DIAGNOSIS — E662 Morbid (severe) obesity with alveolar hypoventilation: Secondary | ICD-10-CM | POA: Diagnosis present

## 2019-01-19 DIAGNOSIS — E875 Hyperkalemia: Secondary | ICD-10-CM | POA: Diagnosis present

## 2019-01-19 DIAGNOSIS — E861 Hypovolemia: Secondary | ICD-10-CM | POA: Diagnosis present

## 2019-01-19 DIAGNOSIS — I472 Ventricular tachycardia, unspecified: Secondary | ICD-10-CM

## 2019-01-19 DIAGNOSIS — Z886 Allergy status to analgesic agent status: Secondary | ICD-10-CM

## 2019-01-19 DIAGNOSIS — Z6841 Body Mass Index (BMI) 40.0 and over, adult: Secondary | ICD-10-CM | POA: Diagnosis not present

## 2019-01-19 DIAGNOSIS — E785 Hyperlipidemia, unspecified: Secondary | ICD-10-CM | POA: Diagnosis not present

## 2019-01-19 DIAGNOSIS — Z91018 Allergy to other foods: Secondary | ICD-10-CM

## 2019-01-19 DIAGNOSIS — M109 Gout, unspecified: Secondary | ICD-10-CM | POA: Diagnosis present

## 2019-01-19 DIAGNOSIS — E876 Hypokalemia: Secondary | ICD-10-CM | POA: Diagnosis not present

## 2019-01-19 DIAGNOSIS — E1122 Type 2 diabetes mellitus with diabetic chronic kidney disease: Secondary | ICD-10-CM | POA: Diagnosis present

## 2019-01-19 DIAGNOSIS — Z8249 Family history of ischemic heart disease and other diseases of the circulatory system: Secondary | ICD-10-CM

## 2019-01-19 DIAGNOSIS — N183 Chronic kidney disease, stage 3 unspecified: Secondary | ICD-10-CM | POA: Diagnosis present

## 2019-01-19 DIAGNOSIS — E1140 Type 2 diabetes mellitus with diabetic neuropathy, unspecified: Secondary | ICD-10-CM | POA: Diagnosis present

## 2019-01-19 DIAGNOSIS — R531 Weakness: Secondary | ICD-10-CM | POA: Diagnosis not present

## 2019-01-19 DIAGNOSIS — E1101 Type 2 diabetes mellitus with hyperosmolarity with coma: Secondary | ICD-10-CM | POA: Diagnosis not present

## 2019-01-19 DIAGNOSIS — Z7982 Long term (current) use of aspirin: Secondary | ICD-10-CM

## 2019-01-19 DIAGNOSIS — Z79899 Other long term (current) drug therapy: Secondary | ICD-10-CM

## 2019-01-19 DIAGNOSIS — Z794 Long term (current) use of insulin: Secondary | ICD-10-CM

## 2019-01-19 DIAGNOSIS — Z95810 Presence of automatic (implantable) cardiac defibrillator: Secondary | ICD-10-CM

## 2019-01-19 DIAGNOSIS — Z888 Allergy status to other drugs, medicaments and biological substances status: Secondary | ICD-10-CM

## 2019-01-19 DIAGNOSIS — I5022 Chronic systolic (congestive) heart failure: Secondary | ICD-10-CM | POA: Diagnosis present

## 2019-01-19 DIAGNOSIS — Z833 Family history of diabetes mellitus: Secondary | ICD-10-CM

## 2019-01-19 LAB — BASIC METABOLIC PANEL
Anion gap: 9 (ref 5–15)
Anion gap: 10 (ref 5–15)
BUN: 37 mg/dL — ABNORMAL HIGH (ref 6–20)
BUN: 34 mg/dL — ABNORMAL HIGH (ref 6–20)
CO2: 26 mmol/L (ref 22–32)
CO2: 23 mmol/L (ref 22–32)
Calcium: 8.5 mg/dL — ABNORMAL LOW (ref 8.9–10.3)
Calcium: 8.3 mg/dL — ABNORMAL LOW (ref 8.9–10.3)
Chloride: 101 mmol/L (ref 98–111)
Chloride: 107 mmol/L (ref 98–111)
Creatinine, Ser: 2.52 mg/dL — ABNORMAL HIGH (ref 0.61–1.24)
Creatinine, Ser: 2.22 mg/dL — ABNORMAL HIGH (ref 0.61–1.24)
GFR calc Af Amer: 35 mL/min — ABNORMAL LOW (ref >60)
GFR calc Af Amer: 40 mL/min — ABNORMAL LOW (ref >60)
GFR calc non Af Amer: 30 mL/min — ABNORMAL LOW (ref >60)
GFR calc non Af Amer: 35 mL/min — ABNORMAL LOW (ref >60)
Glucose, Bld: 665 mg/dL (ref 70–99)
Glucose, Bld: 301 mg/dL — ABNORMAL HIGH (ref 70–99)
Potassium: 3.3 mmol/L — ABNORMAL LOW (ref 3.5–5.1)
Potassium: 3.5 mmol/L (ref 3.5–5.1)
Sodium: 136 mmol/L (ref 135–145)
Sodium: 140 mmol/L (ref 135–145)

## 2019-01-19 LAB — CBC
HCT: 45.8 % (ref 39.0–52.0)
Hemoglobin: 14 g/dL (ref 13.0–17.0)
MCH: 24.9 pg — ABNORMAL LOW (ref 26.0–34.0)
MCHC: 30.6 g/dL (ref 30.0–36.0)
MCV: 81.3 fL (ref 80.0–100.0)
Platelets: 244 10*3/uL (ref 150–400)
RBC: 5.63 MIL/uL (ref 4.22–5.81)
RDW: 15.9 % — ABNORMAL HIGH (ref 11.5–15.5)
WBC: 8.6 10*3/uL (ref 4.0–10.5)
nRBC: 0 % (ref 0.0–0.2)

## 2019-01-19 LAB — GLUCOSE, CAPILLARY
Glucose-Capillary: 130 mg/dL — ABNORMAL HIGH (ref 70–99)
Glucose-Capillary: 167 mg/dL — ABNORMAL HIGH (ref 70–99)
Glucose-Capillary: 252 mg/dL — ABNORMAL HIGH (ref 70–99)
Glucose-Capillary: 420 mg/dL — ABNORMAL HIGH (ref 70–99)
Glucose-Capillary: 480 mg/dL — ABNORMAL HIGH (ref 70–99)
Glucose-Capillary: >600 mg/dL (ref 70–99)
Glucose-Capillary: >600 mg/dL (ref 70–99)
Glucose-Capillary: >600 mg/dL (ref 70–99)

## 2019-01-19 LAB — COMPREHENSIVE METABOLIC PANEL
ALT: 22 U/L (ref 0–44)
AST: 18 U/L (ref 15–41)
Albumin: 3.7 g/dL (ref 3.5–5.0)
Alkaline Phosphatase: 109 U/L (ref 38–126)
Anion gap: 12 (ref 5–15)
BUN: 44 mg/dL — ABNORMAL HIGH (ref 6–20)
CO2: 23 mmol/L (ref 22–32)
Calcium: 9 mg/dL (ref 8.9–10.3)
Chloride: 85 mmol/L — ABNORMAL LOW (ref 98–111)
Creatinine, Ser: 2.94 mg/dL — ABNORMAL HIGH (ref 0.61–1.24)
GFR calc Af Amer: 29 mL/min — ABNORMAL LOW (ref >60)
GFR calc non Af Amer: 25 mL/min — ABNORMAL LOW (ref >60)
Glucose, Bld: 1412 mg/dL (ref 70–99)
Potassium: 5.5 mmol/L — ABNORMAL HIGH (ref 3.5–5.1)
Sodium: 120 mmol/L — ABNORMAL LOW (ref 135–145)
Total Bilirubin: 0.8 mg/dL (ref 0.3–1.2)
Total Protein: 7.6 g/dL (ref 6.5–8.1)

## 2019-01-19 LAB — CBG MONITORING, ED: Glucose-Capillary: >600 mg/dL (ref 70–99)

## 2019-01-19 LAB — TROPONIN I: Troponin I: 0.03 ng/mL (ref <0.03)

## 2019-01-19 LAB — BRAIN NATRIURETIC PEPTIDE: B Natriuretic Peptide: 25.4 pg/mL (ref 0.0–100.0)

## 2019-01-19 LAB — TSH: TSH: 1.874 u[IU]/mL (ref 0.350–4.500)

## 2019-01-19 LAB — MRSA PCR SCREENING: MRSA by PCR: NEGATIVE

## 2019-01-19 MED ORDER — SODIUM CHLORIDE 0.9 % IV BOLUS
1000.0000 mL | Freq: Once | INTRAVENOUS | Status: AC
  Administered 2019-01-19: 1000 mL via INTRAVENOUS

## 2019-01-19 MED ORDER — INSULIN REGULAR(HUMAN) IN NACL 100-0.9 UT/100ML-% IV SOLN: INTRAVENOUS | Status: DC

## 2019-01-19 MED ORDER — LORATADINE 10 MG PO TABS
10.0000 mg | ORAL_TABLET | Freq: Every day | ORAL | Status: DC
  Administered 2019-01-21 – 2019-01-23 (×3): 10 mg via ORAL
  Filled 2019-01-19 (×4): qty 1

## 2019-01-19 MED ORDER — SODIUM CHLORIDE 0.9 % IV SOLN: INTRAVENOUS | Status: DC

## 2019-01-19 MED ORDER — SODIUM CHLORIDE 0.9 % IV SOLN
INTRAVENOUS | Status: AC
  Administered 2019-01-19: 17:00:00 via INTRAVENOUS

## 2019-01-19 MED ORDER — SODIUM CHLORIDE 0.9 % IV SOLN
INTRAVENOUS | Status: DC
  Administered 2019-01-19: 14:00:00 via INTRAVENOUS

## 2019-01-19 MED ORDER — FLUTICASONE PROPIONATE 50 MCG/ACT NA SUSP
2.0000 | Freq: Every day | NASAL | Status: DC
  Administered 2019-01-19: 2 via NASAL
  Filled 2019-01-19: qty 16

## 2019-01-19 MED ORDER — GABAPENTIN 300 MG PO CAPS
300.0000 mg | ORAL_CAPSULE | Freq: Three times a day (TID) | ORAL | Status: DC
  Administered 2019-01-19 – 2019-01-23 (×12): 300 mg via ORAL
  Filled 2019-01-19 (×12): qty 1

## 2019-01-19 MED ORDER — INSULIN REGULAR(HUMAN) IN NACL 100-0.9 UT/100ML-% IV SOLN
INTRAVENOUS | Status: DC
  Administered 2019-01-19: 5.4 [IU]/h via INTRAVENOUS
  Filled 2019-01-19 (×2): qty 100

## 2019-01-19 MED ORDER — DEXTROSE-NACL 5-0.45 % IV SOLN: INTRAVENOUS | Status: DC

## 2019-01-19 MED ORDER — SODIUM CHLORIDE 0.9 % IV BOLUS: 2000.0000 mL | Freq: Once | INTRAVENOUS | Status: DC

## 2019-01-19 MED ORDER — FLUCONAZOLE IN SODIUM CHLORIDE 200-0.9 MG/100ML-% IV SOLN
200.0000 mg | INTRAVENOUS | Status: DC
  Administered 2019-01-19 – 2019-01-20 (×2): 200 mg via INTRAVENOUS
  Filled 2019-01-19 (×3): qty 100

## 2019-01-19 MED ORDER — DEXTROSE-NACL 5-0.45 % IV SOLN
INTRAVENOUS | Status: DC
  Administered 2019-01-19: 23:00:00 via INTRAVENOUS

## 2019-01-19 MED ORDER — ALLOPURINOL 100 MG PO TABS
100.0000 mg | ORAL_TABLET | Freq: Every day | ORAL | Status: DC
  Administered 2019-01-20 – 2019-01-23 (×4): 100 mg via ORAL
  Filled 2019-01-19 (×5): qty 1

## 2019-01-19 MED ORDER — ASPIRIN EC 81 MG PO TBEC: 81.0000 mg | DELAYED_RELEASE_TABLET | Freq: Every day | ORAL | Status: DC

## 2019-01-19 MED ORDER — CHLORHEXIDINE GLUCONATE CLOTH 2 % EX PADS: 6.0000 | MEDICATED_PAD | Freq: Every day | CUTANEOUS | Status: DC

## 2019-01-19 MED ORDER — APIXABAN 5 MG PO TABS: 5.0000 mg | ORAL_TABLET | Freq: Two times a day (BID) | ORAL | Status: DC

## 2019-01-19 MED ORDER — ATORVASTATIN CALCIUM 10 MG PO TABS
10.0000 mg | ORAL_TABLET | Freq: Every day | ORAL | Status: DC
  Administered 2019-01-20 – 2019-01-23 (×4): 10 mg via ORAL
  Filled 2019-01-19 (×4): qty 1

## 2019-01-19 MED ORDER — AMIODARONE HCL 200 MG PO TABS
200.0000 mg | ORAL_TABLET | Freq: Every day | ORAL | Status: DC
  Administered 2019-01-20 – 2019-01-23 (×4): 200 mg via ORAL
  Filled 2019-01-19 (×4): qty 1

## 2019-01-19 MED ORDER — CARVEDILOL 3.125 MG PO TABS: 3.1250 mg | ORAL_TABLET | Freq: Two times a day (BID) | ORAL | Status: DC

## 2019-01-19 MED ORDER — SODIUM CHLORIDE 0.9 % IV SOLN
Freq: Once | INTRAVENOUS | Status: AC
  Administered 2019-01-19: 19:00:00 via INTRAVENOUS

## 2019-01-19 MED ORDER — HEPARIN SODIUM (PORCINE) 5000 UNIT/ML IJ SOLN: 5000.0000 [IU] | Freq: Three times a day (TID) | INTRAMUSCULAR | Status: DC

## 2019-01-19 MED ORDER — APIXABAN 5 MG PO TABS
5.0000 mg | ORAL_TABLET | Freq: Two times a day (BID) | ORAL | Status: DC
  Administered 2019-01-19 – 2019-01-23 (×8): 5 mg via ORAL
  Filled 2019-01-19 (×8): qty 1

## 2019-01-19 NOTE — ED Notes (Signed)
Lab called and alerted this RN that the patient's previous blood samples were hemolyzed. This RN recollected blood and sent to the lab

## 2019-01-19 NOTE — ED Notes (Signed)
Dr. Alvino Chapel notified of Troponin 0.03 and glucose 1412.

## 2019-01-19 NOTE — ED Notes (Signed)
ED TO INPATIENT HANDOFF REPORT  Name/Age/Gender Jeffrey [name] 43 y.o. male  Code Status    Code Status Orders  (From admission, onward)         Start     Ordered   01/19/19 1424  Full code  Continuous     01/19/19 1426        Code Status History    Date Active Date Inactive Code Status Order ID Comments User Context   08/05/2018 0017 08/07/2018 1449 Full Code [id number]  Lenore Cordia, MD ED   12/30/2017 0250 01/01/2018 1901 Full Code [id number]  Johna Sheriff, MD ED   06/08/2017 2301 06/13/2017 1832 Full Code [id number]  Arbutus Leas, NP ED   01/15/2016 0150 01/16/2016 2034 Full Code [id number]  Norval Morton, MD ED   05/17/2015 1649 05/19/2015 1536 Full Code [id number]  Bensimhon, Shaune Pascal, MD Inpatient   05/14/2015 2154 05/17/2015 1649 Full Code [id number]  Allyne Gee, MD ED   12/28/2014 0440 12/30/2014 1758 Full Code [id number]  Lavina Hamman, MD ED   04/05/2014 1938 04/08/2014 1622 Full Code [id number]  Consuelo Pandy, PA-C Inpatient   11/10/2011 0633 11/17/2011 1956 Full Code [id number]  Soyla Dryer, RN Inpatient   10/22/2011 2232 10/24/2011 2239 Full Code [id number]  Gordy Levan, RN Inpatient      Home/SNF/Other Home  Chief Complaint weak,sore throat  Level of Care/Admitting Diagnosis ED Disposition    ED Disposition Condition Comment   Admit  Hospital Area: [facility] [[id number]]  Level of Care: Stepdown [14]  Admit to SDU based on following criteria: Severe physiological/psychological symptoms:  Any diagnosis requiring assessment & intervention at least every 4 hours on an ongoing basis to obtain desired patient outcomes including stability and rehabilitation  Diagnosis: Hyperosmolar (nonketotic) coma [facility]) [[id number]]  Admitting Physician: [place], [place]  Attending Physician: Debbe Odea [[id number]]  Estimated length of stay: past midnight tomorrow  Certification:: I certify this patient will need inpatient services for at least 2 midnights   Possible Covid Disease Patient Isolation: N/A  PT Class (Do Not Modify): Inpatient [101]  PT Acc Code (Do Not Modify): Private [1]       Medical History Past Medical History:  Diagnosis Date  . Anxiety   . Aspirin allergy   . Childhood asthma   . Chronic systolic CHF (congestive heart failure) ([place])    a. EF 20-25% in 2012. b. EF 45-50% in 10/2011 with nonischemic nuc - presumed NICM. c. 12/2014 Echo: Sev depressed LV fxn, sev dil LV, mild LVH, mild MR, sev dil LA, mildly reduced RV fxn.  . CKD (chronic kidney disease) stage 2, GFR 60-89 ml/min   . High cholesterol   . Hypertension   . Morbid obesity ([place])   . Nephrolithiasis   . OSA on CPAP   . Presumed NICM    a. 04/2014 Myoview: EF 26%, glob HK, sev glob HK, ? prior infarct;  b. Never cathed 2/2 CKD.  [name] Renal cell carcinoma ([place])    a. s/p Rt robotic assisted partial converted to radical nephrectomy on 01/2013.  . Troponin level elevated    a. 04/2014, 12/2014: felt due to CHF.  . Type II diabetes mellitus ([place])   . Ventricular tachycardia ([place])    a. appropriate ICD therapy 12/2017    Allergies Allergies  Allergen Reactions  . Aspirin Shortness Of Breath, Itching and Rash     Burning sensation (Patient reports he tolerates other  NSAIDS)   . Bee Venom Hives and Swelling  . Lisinopril Cough  . Tomato Rash    IV Location/Drains/Wounds Patient Lines/Drains/Airways Status   Active Line/Drains/Airways    Name:   Placement date:   Placement time:   Site:   Days:   Peripheral IV 01/19/19 Left Antecubital   01/19/19    1215    Antecubital   less than 1          Labs/Imaging Results for orders placed or performed during the hospital encounter of 01/19/19 (from the past 48 hour(s))  CBC     Status: Abnormal   Collection Time: 01/19/19 12:50 PM  Result Value Ref Range   WBC 8.6 4.0 - 10.5 K/uL   RBC 5.63 4.22 - 5.81 MIL/uL   Hemoglobin 14.0 13.0 - 17.0 g/dL   HCT 45.8 39.0 - 52.0 %   MCV 81.3 80.0 - 100.0 fL   MCH 24.9  (L) 26.0 - 34.0 pg   MCHC 30.6 30.0 - 36.0 g/dL   RDW 15.9 (H) 11.5 - 15.5 %   Platelets 244 150 - 400 K/uL   nRBC 0.0 0.0 - 0.2 %    Comment: Performed at [facility], [place] [address] [place], [place] [address]  Comprehensive metabolic panel     Status: Abnormal   Collection Time: 01/19/19 12:50 PM  Result Value Ref Range   Sodium 120 (L) 135 - 145 mmol/L   Potassium 5.5 (H) 3.5 - 5.1 mmol/L   Chloride 85 (L) 98 - 111 mmol/L   CO2 23 22 - 32 mmol/L   Glucose, Bld 1,412 (HH) 70 - 99 mg/dL    Comment: RESULTS CONFIRMED BY MANUAL DILUTION CRITICAL RESULT CALLED TO, READ BACK BY AND VERIFIED WITH: BINGHAM,S. RN @1347  ON 04.16.2020 BY COHEN,K    BUN 44 (H) 6 - 20 mg/dL   Creatinine, Ser 2.94 (H) 0.61 - 1.24 mg/dL   Calcium 9.0 8.9 - 10.3 mg/dL   Total Protein 7.6 6.5 - 8.1 g/dL   Albumin 3.7 3.5 - 5.0 g/dL   AST 18 15 - 41 U/L   ALT 22 0 - 44 U/L   Alkaline Phosphatase 109 38 - 126 U/L   Total Bilirubin 0.8 0.3 - 1.2 mg/dL   GFR calc non Af Amer 25 (L) >60 mL/min   GFR calc Af Amer 29 (L) >60 mL/min   Anion gap 12 5 - 15    Comment: Performed at [facility], [place] [address] [place], [place] [address]  Troponin I -     Status: Abnormal   Collection Time: 01/19/19 12:50 PM  Result Value Ref Range   Troponin I 0.03 (HH) <0.03 ng/mL    Comment: CRITICAL RESULT CALLED TO, READ BACK BY AND VERIFIED WITH: BINGHAM,S. RN @1336  ON 04.16.2020 BY COHEN,K Performed at [facility], [place] [address] [place], [place] [address]   Brain natriuretic peptide     Status: None   Collection Time: 01/19/19 12:50 PM  Result Value Ref Range   B Natriuretic Peptide 25.4 0.0 - 100.0 pg/mL    Comment: Performed at [facility], [place] [address] [place], [place] [address]  TSH     Status: None   Collection Time: 01/19/19 12:50 PM  Result Value Ref Range   TSH 1.874 0.350 - 4.500 uIU/mL    Comment: Performed by a 3rd Generation  assay with a functional sensitivity of <=0.01 uIU/mL. Performed at [facility], [place] [address], [place], [place] [address]    Dg Chest Portable 1 View  Result Date: 01/19/2019 CLINICAL DATA:  Shortness of breath. EXAM: PORTABLE CHEST 1 VIEW COMPARISON:  Radiographs of August 04, 2018. FINDINGS: The heart size and mediastinal contours are within normal limits. Both lungs are clear. No pneumothorax or pleural effusion is noted. Stable position of defibrillator. The visualized skeletal structures are unremarkable. IMPRESSION: No active disease. Electronically Signed   By: Marijo Conception M.D.   On: 01/19/2019 12:47    Pending Labs Unresulted Labs (From admission, onward)    Start     Ordered   01/19/19 8032  Basic metabolic panel  STAT Now then every 4 hours ,   STAT     01/19/19 1426   01/19/19 1424  CBC  (heparin)  Once,   R    Comments:  Baseline for heparin therapy IF NOT ALREADY DRAWN.  Notify MD if PLT < 100 K.    01/19/19 1426          Vitals/Pain Today's Vitals   01/19/19 1230 01/19/19 1300 01/19/19 1400 01/19/19 1434  BP: 113/66 99/65 100/75 103/80  Pulse: 68 66 60 73  Resp: (!) 23 20 19 20   Temp:      TempSrc:      SpO2: 95% 97% 98% 95%  Weight:      Height:      PainSc:        Isolation Precautions No active isolations  Medications Medications  insulin regular, human (MYXREDLIN) 100 units/ 100 mL infusion (5.4 Units/hr Intravenous New Bag/Given 01/19/19 1430)  0.9 %  sodium chloride infusion (has no administration in time range)  0.9 %  sodium chloride infusion (has no administration in time range)  dextrose 5 %-0.45 % sodium chloride infusion (has no administration in time range)  heparin injection 5,000 Units (has no administration in time range)  sodium chloride 0.9 % bolus 2,000 mL (has no administration in time range)  sodium chloride 0.9 % bolus 1,000 mL (1,000 mLs Intravenous Bolus 01/19/19 1422)    Mobility walks

## 2019-01-19 NOTE — Telephone Encounter (Signed)
Duplicate

## 2019-01-19 NOTE — Progress Notes (Signed)
Patient's manual BP 68/30. MD made aware. 3/3L NS bolus infusing now.

## 2019-01-19 NOTE — H&P (Signed)
History and Physical    Jeffrey [name]  MBT:[id number]  DOB: 1976-02-12  DOA: 01/19/2019 PCP: [name], FNP   Patient coming from: home  Chief Complaint:  Weakness, thirst  HPI: Jeffrey [name] is a 43 y.o. 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 He has not checked his sugars in 2 months. In the ED his sugars is 1412. He has had a sore throat for about 3 wks. He was treated with a course of Amoxicillin by his PCP for strep throat (per Epic, it was Augmentin for 7 days). The rapid strep screen was negative but the culture was positive. He has continued to have a severely sore throat despite treatment.  He has severe thirst and has been drinking and urinating non-stop. He has been checking the time and is urinating every hour.  He has had a runny nose and is being treated for seasonal allergies. He has an occasional feeling of fullness in his ears. No cough, dyspnea or fever noted.  He has not had a BM in 2 wks but also states he has not had solid food. Per his PCP note in March, he has been juicing.   He has been taking his insulin and all of his other medications.   ED Course: glucose 1,412, Cr is elevated at 2.94, Troponin 0.03  Review of Systems:  Weight loss 309> 286 lb ( this was 2 wks ago)- has not had weight since Tingling in hands  pain in knees  All other systems reviewed and apart from HPI, are negative.  Past Medical History:  Diagnosis Date  . Anxiety   . Aspirin allergy   . Childhood asthma   . Chronic systolic CHF (congestive heart failure) ([place])    a. EF 20-25% in 2012. b. EF 45-50% in 10/2011 with nonischemic nuc - presumed NICM. c. 12/2014 Echo: Sev depressed LV fxn, sev dil LV, mild LVH, mild MR, sev dil LA, mildly reduced RV fxn.  . CKD (chronic kidney disease) stage 2, GFR  60-89 ml/min   . High cholesterol   . Hypertension   . Morbid obesity ([place])   . Nephrolithiasis   . OSA on CPAP   . Presumed NICM    a. 04/2014 Myoview: EF 26%, glob HK, sev glob HK, ? prior infarct;  b. Never cathed 2/2 CKD.  [name] Renal cell carcinoma ([place])    a. s/p Rt robotic assisted partial converted to radical nephrectomy on 01/2013.  . Troponin level elevated    a. 04/2014, 12/2014: felt due to CHF.  . Type II diabetes mellitus ([place])   . Ventricular tachycardia ([place])    a. appropriate ICD therapy 12/2017    Past Surgical History:  Procedure Laterality Date  . APPENDECTOMY  07/2004  . CARDIAC CATHETERIZATION N/A 05/17/2015   Procedure: Right/Left Heart Cath and Coronary Angiography;  Surgeon: Jolaine Artist, MD;  Location: [place] [facility];  Service: Cardiovascular;  Laterality: N/A;  . EP IMPLANTABLE DEVICE N/A 05/17/2015   Procedure: SubQ ICD Implant;  Surgeon: Will Meredith Leeds, MD;  Location: [place] [facility];  Service: Cardiovascular;  Laterality: N/A;  . ROBOTIC ASSISTED LAPAROSCOPIC LYSIS OF ADHESION  01/13/2013   Procedure: ROBOTIC ASSISTED LAPAROSCOPIC LYSIS OF ADHESION EXTENSIVE;  Surgeon: Alexis Frock, MD;  Location: [facility];  Service: Urology;;  . ROBOTIC ASSITED PARTIAL NEPHRECTOMY Right 01/13/2013   Procedure: ROBOTIC ASSITED PARTIAL NEPHRECTOMY CONVERTED TO ROBOTIC ASSISTED RIGHT RADICAL NEPHRECTOMY;  Surgeon: Alexis Frock, MD;  Location: [facility];  Service: Urology;  Laterality: Right;    Social History:   reports that he quit smoking about 3 years ago. He has a 5.50 pack-year smoking history. He has never used smokeless tobacco. He reports current drug use. Frequency: 14.00 times per week. Drug: Marijuana. He reports that he does not drink alcohol.  Allergies  Allergen Reactions  . Aspirin Shortness Of Breath, Itching and Rash     Burning sensation (Patient reports he tolerates other NSAIDS)   . Bee Venom Hives and Swelling  . Lisinopril Cough  . Tomato  Rash    Family History  Problem Relation Age of Onset  . Hypertension Mother   . Diabetes Maternal Aunt   . Heart attack Neg Hx   . Stroke Neg Hx      Prior to Admission medications   Medication Sig Start Date End Date Taking? Authorizing Provider  allopurinol (ZYLOPRIM) 100 MG tablet Take 1 tablet (100 mg total) by mouth daily. 10/31/18  Yes [name], FNP  amiodarone (PACERONE) 200 MG tablet Take 200 mg by mouth daily.   Yes [provider]  apixaban (ELIQUIS) 5 MG TABS tablet Take 1 tablet (5 mg total) by mouth 2 (two) times daily. 08/16/18  Yes [name], FNP  aspirin EC 81 MG tablet Take 81 mg by mouth daily.   Yes [provider]  atorvastatin (LIPITOR) 10 MG tablet Take 1 tablet (10 mg total) by mouth daily. 12/07/18  Yes [name], FNP  blood glucose meter kit and supplies Dispense based on patient and insurance preference. Use up to four times daily as directed. (FOR ICD-10 E10.9, E11.9). 01/09/19  Yes [name], FNP  carvedilol (COREG) 3.125 MG tablet Take 1 tablet (3.125 mg total) by mouth 2 (two) times daily. 12/22/18  Yes [name], FNP  cetirizine (ZYRTEC) 10 MG tablet Take 1 tablet (10 mg total) by mouth daily. 12/07/18  Yes [name], FNP  fluticasone (FLONASE) 50 MCG/ACT nasal spray Place 2 sprays into both nostrils daily. 12/07/18  Yes [name], FNP  gabapentin (NEURONTIN) 300 MG capsule TAKE 1 CAPSULE BY MOUTH 3 TIMES DAILY. 07/12/18  Yes [name], FNP  insulin NPH-regular Human (70-30) 100 UNIT/ML injection Inject 34 Units into the skin 2 (two) times daily with a meal. 12/22/18  Yes [name], FNP  isosorbide-hydrALAZINE (BIDIL) 20-37.5 MG tablet Take 1 tablet by mouth 3 (three) times daily. 07/12/18  Yes Bensimhon, Shaune Pascal, MD  Magnesium Oxide 200 MG TABS Take 100 mg by mouth daily.   Yes [provider]  meclizine (ANTIVERT) 25 MG tablet Take 1 tablet (25 mg total) by mouth 3  (three) times daily. 12/07/18  Yes [name], FNP  MITIGARE 0.6 MG CAPS Take 0.6 mg by mouth as needed (Gout).  07/12/18  Yes [provider]  ondansetron (ZOFRAN) 4 MG tablet Take 1 tablet (4 mg total) by mouth every 8 (eight) hours as needed for nausea or vomiting. 12/30/18  Yes [name], FNP  sacubitril-valsartan (ENTRESTO) 97-103 MG Take 1 tablet by mouth 2 (two) times daily. 08/16/18  Yes [name], FNP  torsemide (DEMADEX) 20 MG tablet Take 2 tablets (40 mg total) by mouth daily. 11/09/18  Yes [name],  FNP  Vitamin D, Ergocalciferol, (DRISDOL) 1.25 MG (50000 UT) CAPS capsule Take 50,000 Units by mouth every 30 (thirty) days. 08/10/18  Yes [provider]    Physical Exam: Wt Readings from Last 3 Encounters:  01/19/19 129.7 kg  12/30/18 129.7 kg  12/07/18 (!) 140.2 kg   Vitals:   01/19/19 1225 01/19/19 1230 01/19/19 1300 01/19/19 1400  BP:  113/66 99/65 100/75  Pulse:  68 66 60  Resp:  (!) '23 20 19  ' Temp:      TempSrc:      SpO2:  95% 97% 98%  Weight: 129.7 kg     Height: '5\' 9"'  (1.753 m)         Constitutional:  Calm & comfortable Eyes: PERRLA, lids and conjunctivae normal ENT:  Mucous membranes are moist.  Pharynx shows white exudate Normal dentition.  Neck: Supple, no masses  Respiratory:  Clear to auscultation bilaterally  Normal respiratory effort.  Cardiovascular:  S1 & S2 heard, regular rate and rhythm No Murmurs Abdomen:  Non distended No tenderness, No masses Bowel sounds normal Extremities:  No clubbing / cyanosis No pedal edema No joint deformity    Skin:  No rashes, lesions or ulcers Neurologic:  AAO x 3 CN 2-12 grossly intact Sensation intact Strength 5/5 in all 4 extremities Psychiatric:  Normal Mood and affect    Labs on Admission: I have personally reviewed following labs and imaging studies  CBC: Recent Labs  Lab 01/19/19 1250  WBC 8.6  HGB 14.0  HCT 45.8  MCV 81.3  PLT 470    Basic Metabolic Panel: Recent Labs  Lab 01/19/19 1250  NA 120*  K 5.5*  CL 85*  CO2 23  GLUCOSE 1,412*  BUN 44*  CREATININE 2.94*  CALCIUM 9.0   GFR: Estimated Creatinine Clearance: 42.8 mL/min (A) (by C-G formula based on SCr of 2.94 mg/dL (H)). Liver Function Tests: Recent Labs  Lab 01/19/19 1250  AST 18  ALT 22  ALKPHOS 109  BILITOT 0.8  PROT 7.6  ALBUMIN 3.7   No results for input(s): LIPASE, AMYLASE in the last 168 hours. No results for input(s): AMMONIA in the last 168 hours. Coagulation Profile: No results for input(s): INR, PROTIME in the last 168 hours. Cardiac Enzymes: Recent Labs  Lab 01/19/19 1250  TROPONINI 0.03*   BNP (last 3 results) No results for input(s): PROBNP in the last 8760 hours. HbA1C: No results for input(s): HGBA1C in the last 72 hours. CBG: No results for input(s): GLUCAP in the last 168 hours. Lipid Profile: No results for input(s): CHOL, HDL, LDLCALC, TRIG, CHOLHDL, LDLDIRECT in the last 72 hours. Thyroid Function Tests: Recent Labs    01/19/19 1250  TSH 1.874   Anemia Panel: No results for input(s): VITAMINB12, FOLATE, FERRITIN, TIBC, IRON, RETICCTPCT in the last 72 hours. Urine analysis:    Component Value Date/Time   COLORURINE YELLOW 12/29/2017 1933   APPEARANCEUR CLEAR 12/29/2017 1933   LABSPEC 1.020 12/29/2017 1933   PHURINE 5.0 12/29/2017 1933   GLUCOSEU NEGATIVE 12/29/2017 1933   HGBUR SMALL (A) 12/29/2017 1933   BILIRUBINUR negative 11/21/2018 1550   KETONESUR negative 11/21/2018 [place] 12/29/2017 1933   PROTEINUR negative 11/21/2018 1550   PROTEINUR >=300 (A) 12/29/2017 1933   UROBILINOGEN 0.2 11/21/2018 1550   UROBILINOGEN 0.2 11/01/2017 1128   NITRITE Negative 11/21/2018 1550   NITRITE NEGATIVE 12/29/2017 1933   LEUKOCYTESUR Negative 11/21/2018 1550   Sepsis Labs: '@LABRCNTIP' (procalcitonin:4,lacticidven:4) )No results found for  this or any previous visit (from the past 240 hour(s)).    Radiological Exams on Admission: Dg Chest Portable 1 View  Result Date: 01/19/2019 CLINICAL DATA:  Shortness of breath. EXAM: PORTABLE CHEST 1 VIEW COMPARISON:  Radiographs of August 04, 2018. FINDINGS: The heart size and mediastinal contours are within normal limits. Both lungs are clear. No pneumothorax or pleural effusion is noted. Stable position of defibrillator. The visualized skeletal structures are unremarkable. IMPRESSION: No active disease. Electronically Signed   By: Marijo Conception M.D.   On: 01/19/2019 12:47    EKG: Independently reviewed. Sinus rhythm at 65 bpm  Assessment/Plan Principal Problem:   Hyperosmolar (nonketotic) coma - his glucometer is broken and thus he has not checked sugars in 2 months - last A1c was 11.7 in February of this year - he will receive 3 L NS and has been started on an insulin infusion - NPO except water - cont Insulin infusion until sugars controlled - he uses 70/30 34 U BID - suspect his Juicing and strep throat may have had something to do with sugars being uncontrolled - check A1c - will need to determine new dose of Insulin when discharged- he will need a new glucometer Active Problems:  Constipation - has not had a BM in 2 wks but also has not had much solid food - check abd xray    Chronic systolic CHF (congestive heart failure)  - EF 20-25% on ECHO last year - hold diuretics for now while dehydrated    Morbid obesity     OSA (obstructive sleep apnea)   Body mass index is 42.23 kg/m. - will order CPAP - he has been trying to lose weight and is losing weight steadily    Renal cell carcinoma  - s/p right nephrectomy    Gouty arthritis - cont Allopurinol    CKD (chronic kidney disease), stage 2-3   - Hypovolemia - Cr 1.54 on 11/28/18 and now is 2.94- likely prerenal - hold diuretics and hydrate  Hyperkalemia - should improve with IVF and as Cr improves    PAF (paroxysmal atrial fibrillation) - cont Coreg, Amiodarone  and Eliquis- currently in NSR    Ventricular tachycardia  - has  AICD (automatic cardioverter/defibrillator) present     DVT prophylaxis: Eliquis Code Status: Full code Family Communication:   Disposition Plan: home when sugars improved Consults called: none  Admission status: inpatient    Debbe Odea MD [facility] Pager: [email or web address] Password [email or web address] [phone], please contact night-coverage   01/19/2019, 2:33 PM

## 2019-01-19 NOTE — ED Triage Notes (Signed)
Patient presents with multiple complaints including generalized body aches and chills, sore throat, constipation, headaches. Pt denies N/V/D, cough and fever.  Pt denies recently having been out of the country, state, or his house.

## 2019-01-19 NOTE — Progress Notes (Signed)
CRITICAL VALUE ALERT  Critical Value:  Blood glucose 665  Date & Time Notied: 01/19/19  1910  Provider Notified:   Orders Received/Actions taken: Pt on insulin drip

## 2019-01-19 NOTE — Progress Notes (Signed)
ANTICOAGULATION CONSULT NOTE - Initial Consult  Pharmacy Consult for apixaban Indication: atrial fibrillation  Allergies  Allergen Reactions  . Aspirin Shortness Of Breath, Itching and Rash     Burning sensation (Patient reports he tolerates other NSAIDS)   . Bee Venom Hives and Swelling  . Lisinopril Cough  . Tomato Rash    Patient Measurements: Height: 5\' 9"  (175.3 cm) Weight: 285 lb 15 oz (129.7 kg) IBW/kg (Calculated) : 70.7   Vital Signs: Temp: 99 F (37.2 C) (04/16 1220) Temp Source: Oral (04/16 1220) BP: 119/67 (04/16 1505) Pulse Rate: 74 (04/16 1505)  Labs: Recent Labs    01/19/19 1250  HGB 14.0  HCT 45.8  PLT 244  CREATININE 2.94*  TROPONINI 0.03*    Estimated Creatinine Clearance: 42.8 mL/min (A) (by C-G formula based on SCr of 2.94 mg/dL (H)).   Medical History: Past Medical History:  Diagnosis Date  . Anxiety   . Aspirin allergy   . Childhood asthma   . Chronic systolic CHF (congestive heart failure) ([place])    a. EF 20-25% in 2012. b. EF 45-50% in 10/2011 with nonischemic nuc - presumed NICM. c. 12/2014 Echo: Sev depressed LV fxn, sev dil LV, mild LVH, mild MR, sev dil LA, mildly reduced RV fxn.  . CKD (chronic kidney disease) stage 2, GFR 60-89 ml/min   . High cholesterol   . Hypertension   . Morbid obesity ([place])   . Nephrolithiasis   . OSA on CPAP   . Presumed NICM    a. 04/2014 Myoview: EF 26%, glob HK, sev glob HK, ? prior infarct;  b. Never cathed 2/2 CKD.  [name] Renal cell carcinoma ([place])    a. s/p Rt robotic assisted partial converted to radical nephrectomy on 01/2013.  . Troponin level elevated    a. 04/2014, 12/2014: felt due to CHF.  . Type II diabetes mellitus ([place])   . Ventricular tachycardia ([place])    a. appropriate ICD therapy 12/2017      Assessment: 43 y.o. 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 Pharmacy consulted to dose eliquis.  Home dose 5mg  BID.  01/19/2019 Scr 2.94 (baseline ~ 1.6)  Goal of Therapy:  Monitor platelets by anticoagulation protocol Therapeutic anticoagulation Plan:  Pt not a high bleed risk will resume home dose of apixaban 5mg  po twice daily and watch for Scr to return closer to baseline.   Dolly Rias RPh 01/19/2019, 3:51 PM Pager [phone]

## 2019-01-19 NOTE — Progress Notes (Signed)
3rd NS bolus done. BP 80/49. N.P. Informed. Ordered another 1L NS bolus. Will continue to monitor.

## 2019-01-19 NOTE — ED Provider Notes (Addendum)
[facility] DEPT Provider Note   CSN: [id number] Arrival date & time: 01/19/19  1103    History   Chief Complaint Chief Complaint  Patient presents with  . Sore Throat  . Headache  . Constipation  . Weakness  . Generalized Body Aches    HPI Jeffrey [name] is a 43 y.o. male.     HPI Patient presents with around a month of feeling bad.  States started with a sore throat.  Has had aches and chills.  Had a strep test at PCPs office that was negative.  Continue to have decreased oral intake.  States he is been able to drink some water but not eating much food.  Has had some constipation.  No nausea vomiting diarrhea.  No real cough.  States has not had a fever.  No recent travel.  History of chronic CHF.  States is been getting up frequently at night to urinate.  Denies swelling his legs.  States he is also had trouble getting an erection. Past Medical History:  Diagnosis Date  . Anxiety   . Aspirin allergy   . Childhood asthma   . Chronic systolic CHF (congestive heart failure) ([place])    a. EF 20-25% in 2012. b. EF 45-50% in 10/2011 with nonischemic nuc - presumed NICM. c. 12/2014 Echo: Sev depressed LV fxn, sev dil LV, mild LVH, mild MR, sev dil LA, mildly reduced RV fxn.  . CKD (chronic kidney disease) stage 2, GFR 60-89 ml/min   . High cholesterol   . Hypertension   . Morbid obesity ([place])   . Nephrolithiasis   . OSA on CPAP   . Presumed NICM    a. 04/2014 Myoview: EF 26%, glob HK, sev glob HK, ? prior infarct;  b. Never cathed 2/2 CKD.  [name] Renal cell carcinoma ([place])    a. s/p Rt robotic assisted partial converted to radical nephrectomy on 01/2013.  . Troponin level elevated    a. 04/2014, 12/2014: felt due to CHF.  . Type II diabetes mellitus ([place])   . Ventricular tachycardia ([place])    a. appropriate ICD therapy 12/2017    Patient Active Problem List   Diagnosis Date Noted  . Hyperlipidemia 12/07/2018  . Follow-up exam 11/22/2018  . Class 3  severe obesity due to excess calories with serious comorbidity and body mass index (BMI) of 50.0 to 59.9 in adult ([place]) 11/22/2018  . Dizziness 11/22/2018  . Lingular pneumonia 08/04/2018  . Ventricular tachycardia ([place]) 12/30/2017  . PAF (paroxysmal atrial fibrillation) ([place])   . Dilated cardiomyopathy ([place])   . Pollen allergy 02/17/2017  . Dyslipidemia 02/17/2017  . Hematochezia 11/08/2015  . CKD (chronic kidney disease), stage III ([place]) 06/20/2015  . Cardiac device in situ, other   . Chronic systolic CHF (congestive heart failure) ([place]) 03/11/2015  . Gouty arthritis 03/11/2015  . Chest pain 12/28/2014  . Aspirin allergy 04/08/2014  . Renal cell carcinoma ([place])   . Gout attack 11/10/2011  . Morbid obesity ([place]) 10/23/2011  . OSA (obstructive sleep apnea) 10/23/2011  . Type 2 diabetes mellitus with stage 3 chronic kidney disease, with long-term current use of insulin ([place]) 10/22/2011  . Hyperlipidemia associated with type 2 diabetes mellitus ([place]) 12/25/2010  . Hypertension associated with diabetes ([place]) 12/25/2010    Past Surgical History:  Procedure Laterality Date  . APPENDECTOMY  07/2004  . CARDIAC CATHETERIZATION N/A 05/17/2015   Procedure: Right/Left Heart Cath and Coronary Angiography;  Surgeon: Jolaine Artist, MD;  Location: [facility];  Service: Cardiovascular;  Laterality: N/A;  . EP IMPLANTABLE DEVICE N/A 05/17/2015   Procedure: SubQ ICD Implant;  Surgeon: Will Meredith Leeds, MD;  Location: [facility];  Service: Cardiovascular;  Laterality: N/A;  . ROBOTIC ASSISTED LAPAROSCOPIC LYSIS OF ADHESION  01/13/2013   Procedure: ROBOTIC ASSISTED LAPAROSCOPIC LYSIS OF ADHESION EXTENSIVE;  Surgeon: Alexis Frock, MD;  Location: [facility];  Service: Urology;;  . ROBOTIC ASSITED PARTIAL NEPHRECTOMY Right 01/13/2013   Procedure: ROBOTIC ASSITED PARTIAL NEPHRECTOMY CONVERTED TO ROBOTIC ASSISTED RIGHT RADICAL NEPHRECTOMY;  Surgeon: Alexis Frock, MD;  Location: [facility];   Service: Urology;  Laterality: Right;        Home Medications    Prior to Admission medications   Medication Sig Start Date End Date Taking? Authorizing Provider  allopurinol (ZYLOPRIM) 100 MG tablet Take 1 tablet (100 mg total) by mouth daily. 10/31/18  Yes [name], FNP  amiodarone (PACERONE) 200 MG tablet Take 200 mg by mouth daily.   Yes [provider]  apixaban (ELIQUIS) 5 MG TABS tablet Take 1 tablet (5 mg total) by mouth 2 (two) times daily. 08/16/18  Yes [name], FNP  aspirin EC 81 MG tablet Take 81 mg by mouth daily.   Yes [provider]  atorvastatin (LIPITOR) 10 MG tablet Take 1 tablet (10 mg total) by mouth daily. 12/07/18  Yes [name], FNP  blood glucose meter kit and supplies Dispense based on patient and insurance preference. Use up to four times daily as directed. (FOR ICD-10 E10.9, E11.9). 01/09/19  Yes [name], FNP  carvedilol (COREG) 3.125 MG tablet Take 1 tablet (3.125 mg total) by mouth 2 (two) times daily. 12/22/18  Yes [name], FNP  cetirizine (ZYRTEC) 10 MG tablet Take 1 tablet (10 mg total) by mouth daily. 12/07/18  Yes [name], FNP  fluticasone (FLONASE) 50 MCG/ACT nasal spray Place 2 sprays into both nostrils daily. 12/07/18  Yes [name], FNP  gabapentin (NEURONTIN) 300 MG capsule TAKE 1 CAPSULE BY MOUTH 3 TIMES DAILY. 07/12/18  Yes [name], FNP  insulin NPH-regular Human (70-30) 100 UNIT/ML injection Inject 34 Units into the skin 2 (two) times daily with a meal. 12/22/18  Yes [name], FNP  isosorbide-hydrALAZINE (BIDIL) 20-37.5 MG tablet Take 1 tablet by mouth 3 (three) times daily. 07/12/18  Yes Bensimhon, Shaune Pascal, MD  Magnesium Oxide 200 MG TABS Take 100 mg by mouth daily.   Yes [provider]  meclizine (ANTIVERT) 25 MG tablet Take 1 tablet (25 mg total) by mouth 3 (three) times daily. 12/07/18  Yes [name], FNP  MITIGARE 0.6 MG CAPS Take 0.6 mg by  mouth as needed (G[place]).  07/12/18  Yes [provider]  ondansetron (ZOFRAN) 4 MG tablet Take 1 tablet (4 mg total) by mouth every 8 (eight) hours as needed for nausea or vomiting. 12/30/18  Yes [name], FNP  sacubitril-valsartan (ENTRESTO) 97-103 MG Take 1 tablet by mouth 2 (two) times daily. 08/16/18  Yes [name], FNP  torsemide (DEMADEX) 20 MG tablet Take 2 tablets (40 mg total) by mouth daily. 11/09/18  Yes [name], FNP  Vitamin D, Ergocalciferol, (DRISDOL) 1.25 MG (50000 UT) CAPS capsule Take 50,000 Units by mouth every 30 (thirty) days. 08/10/18  Yes [provider]    Family History Family History  Problem Relation Age of Onset  . Hypertension Mother   .  Diabetes Maternal Aunt   . Heart attack Neg Hx   . Stroke Neg Hx     Social History Social History   Tobacco Use  . Smoking status: Former Smoker    Packs/day: 0.25    Years: 22.00    Pack years: 5.50    Last attempt to quit: 04/16/2015    Years since quitting: 3.7  . Smokeless tobacco: Never Used  Substance Use Topics  . Alcohol use: No    Comment: occ   . Drug use: Yes    Frequency: 14.0 times per week    Types: Marijuana    Comment: uses marijuana twice a day     Allergies   Aspirin; Bee venom; Lisinopril; and Tomato   Review of Systems Review of Systems  Constitutional: Positive for appetite change.  HENT: Positive for sore throat. Negative for sinus pressure and voice change.   Respiratory: Negative for chest tightness.   Cardiovascular: Negative for chest pain.  Gastrointestinal: Negative for abdominal pain.  Endocrine: Negative for polyuria.  Genitourinary: Negative for flank pain.  Musculoskeletal: Negative for back pain.  Skin: Negative for rash.  Neurological: Positive for weakness.  Psychiatric/Behavioral: Negative for confusion.     Physical Exam Updated Vital Signs BP 113/66   Pulse 68   Temp 99 F (37.2 C) (Oral)   Resp (!) 23   Ht '5\' 9"'   (1.753 m)   Wt 129.7 kg   SpO2 95%   BMI 42.23 kg/m   Physical Exam Vitals signs and nursing note reviewed.  HENT:     Head: Normocephalic.     Mouth/Throat:     Comments: Posterior pharyngeal erythema with some swelling but not frank exudate.  Some anterior cervical lymphadenopathy. Cardiovascular:     Rate and Rhythm: Regular rhythm.  Pulmonary:     Breath sounds: No wheezing, rhonchi or rales.  Abdominal:     Tenderness: There is no abdominal tenderness.     Hernia: No hernia is present.  Skin:    General: Skin is warm.  Neurological:     Mental Status: He is alert.      ED Treatments / Results  Labs (all labs ordered are listed, but only abnormal results are displayed) Labs Reviewed  CBC - Abnormal; Notable for the following components:      Result Value   MCH 24.9 (*)    RDW 15.9 (*)    All other components within normal limits  COMPREHENSIVE METABOLIC PANEL - Abnormal; Notable for the following components:   Sodium 120 (*)    Potassium 5.5 (*)    Chloride 85 (*)    Glucose, Bld 1,412 (*)    BUN 44 (*)    Creatinine, Ser 2.94 (*)    GFR calc non Af Amer 25 (*)    GFR calc Af Amer 29 (*)    All other components within normal limits  TROPONIN I - Abnormal; Notable for the following components:   Troponin I 0.03 (*)    All other components within normal limits  TSH  BRAIN NATRIURETIC PEPTIDE    EKG EKG Interpretation  Date/Time:  Thursday January 19 2019 11:56:10 EDT Ventricular Rate:  65 PR Interval:    QRS Duration: 106 QT Interval:  467 QTC Calculation: 486 R Axis:   157 Text Interpretation:  Sinus rhythm Probable left atrial enlargement Right axis deviation Low voltage, extremity leads Consider anterior infarct Nonspecific T abnormalities, inferior leads Baseline wander in lead(s) V1 Confirmed by Alvino Chapel,  Ovid Curd [phone]) on 01/19/2019 12:20:42 PM   Radiology Dg Chest Portable 1 View  Result Date: 01/19/2019 CLINICAL DATA:  Shortness of breath.  EXAM: PORTABLE CHEST 1 VIEW COMPARISON:  Radiographs of August 04, 2018. FINDINGS: The heart size and mediastinal contours are within normal limits. Both lungs are clear. No pneumothorax or pleural effusion is noted. Stable position of defibrillator. The visualized skeletal structures are unremarkable. IMPRESSION: No active disease. Electronically Signed   By: Marijo Conception M.D.   On: 01/19/2019 12:47    Procedures Procedures (including critical care time)  Medications Ordered in ED Medications  dextrose 5 %-0.45 % sodium chloride infusion (has no administration in time range)  insulin regular, human (MYXREDLIN) 100 units/ 100 mL infusion (has no administration in time range)  sodium chloride 0.9 % bolus 1,000 mL (has no administration in time range)    And  0.9 %  sodium chloride infusion (has no administration in time range)     Initial Impression / Assessment and Plan / ED Course  I have reviewed the triage vital signs and the nursing notes.  Pertinent labs & imaging results that were available during my care of the patient were reviewed by me and considered in my medical decision making (see chart for details).       Patient with sorethroat and fatigue. Has AKI likely from dehydration with a glucose of 1400. Bicarb is 23 without anion gap. Doubt DKA. Will be somewhat careful with fluids since has CHF history. Will admit to hospitalist.   Reviewing labs previous strep culture was positive. Has been on Augmentin.  CRITICAL CARE Performed by: Davonna Belling Total critical care time: 30 minutes Critical care time was exclusive of separately billable procedures and treating other patients. Critical care was necessary to treat or prevent imminent or life-threatening deterioration. Critical care was time spent personally by me on the following activities: development of treatment plan with patient and/or surrogate as well as nursing, discussions with consultants, evaluation of  patient's response to treatment, examination of patient, obtaining history from patient or surrogate, ordering and performing treatments and interventions, ordering and review of laboratory studies, ordering and review of radiographic studies, pulse oximetry and re-evaluation of patient's condition.  Final Clinical Impressions(s) / ED Diagnoses   Final diagnoses:  Hyperglycemia  Dehydration  AKI (acute kidney injury) [facility])    ED Discharge Orders    None       Davonna Belling, MD 01/19/19 1353    Davonna Belling, MD 01/19/19 1354    Davonna Belling, MD 01/19/19 1401

## 2019-01-19 NOTE — Telephone Encounter (Signed)
FYI- Patient states that he is still having fatigue, muscle soreness all over, sore throat, diarrhea,and sob. Patient states that he does not have a fever or cough. Patient was advise per Venora Maples the provider to go to ED to be checked out.

## 2019-01-20 ENCOUNTER — Encounter (HOSPITAL_COMMUNITY): Payer: Self-pay | Admitting: *Deleted

## 2019-01-20 LAB — GLUCOSE, CAPILLARY
Glucose-Capillary: 132 mg/dL — ABNORMAL HIGH (ref 70–99)
Glucose-Capillary: 138 mg/dL — ABNORMAL HIGH (ref 70–99)
Glucose-Capillary: 140 mg/dL — ABNORMAL HIGH (ref 70–99)
Glucose-Capillary: 149 mg/dL — ABNORMAL HIGH (ref 70–99)
Glucose-Capillary: 231 mg/dL — ABNORMAL HIGH (ref 70–99)
Glucose-Capillary: 250 mg/dL — ABNORMAL HIGH (ref 70–99)
Glucose-Capillary: 346 mg/dL — ABNORMAL HIGH (ref 70–99)
Glucose-Capillary: 440 mg/dL — ABNORMAL HIGH (ref 70–99)
Glucose-Capillary: 503 mg/dL (ref 70–99)

## 2019-01-20 LAB — BASIC METABOLIC PANEL
Anion gap: 9 (ref 5–15)
Anion gap: 10 (ref 5–15)
Anion gap: 10 (ref 5–15)
BUN: 30 mg/dL — ABNORMAL HIGH (ref 6–20)
BUN: 32 mg/dL — ABNORMAL HIGH (ref 6–20)
BUN: 29 mg/dL — ABNORMAL HIGH (ref 6–20)
CO2: 23 mmol/L (ref 22–32)
CO2: 20 mmol/L — ABNORMAL LOW (ref 22–32)
CO2: 18 mmol/L — ABNORMAL LOW (ref 22–32)
Calcium: 7.7 mg/dL — ABNORMAL LOW (ref 8.9–10.3)
Calcium: 7.6 mg/dL — ABNORMAL LOW (ref 8.9–10.3)
Calcium: 7.4 mg/dL — ABNORMAL LOW (ref 8.9–10.3)
Chloride: 109 mmol/L (ref 98–111)
Chloride: 109 mmol/L (ref 98–111)
Chloride: 106 mmol/L (ref 98–111)
Creatinine, Ser: 1.95 mg/dL — ABNORMAL HIGH (ref 0.61–1.24)
Creatinine, Ser: 1.89 mg/dL — ABNORMAL HIGH (ref 0.61–1.24)
Creatinine, Ser: 1.88 mg/dL — ABNORMAL HIGH (ref 0.61–1.24)
GFR calc Af Amer: 47 mL/min — ABNORMAL LOW (ref >60)
GFR calc Af Amer: 49 mL/min — ABNORMAL LOW (ref >60)
GFR calc Af Amer: 49 mL/min — ABNORMAL LOW (ref >60)
GFR calc non Af Amer: 41 mL/min — ABNORMAL LOW (ref >60)
GFR calc non Af Amer: 42 mL/min — ABNORMAL LOW (ref >60)
GFR calc non Af Amer: 42 mL/min — ABNORMAL LOW (ref >60)
Glucose, Bld: 152 mg/dL — ABNORMAL HIGH (ref 70–99)
Glucose, Bld: 233 mg/dL — ABNORMAL HIGH (ref 70–99)
Glucose, Bld: 478 mg/dL — ABNORMAL HIGH (ref 70–99)
Potassium: 2.7 mmol/L — CL (ref 3.5–5.1)
Potassium: 3.2 mmol/L — ABNORMAL LOW (ref 3.5–5.1)
Potassium: 3.3 mmol/L — ABNORMAL LOW (ref 3.5–5.1)
Sodium: 141 mmol/L (ref 135–145)
Sodium: 139 mmol/L (ref 135–145)
Sodium: 134 mmol/L — ABNORMAL LOW (ref 135–145)

## 2019-01-20 LAB — CBC
HCT: 38.6 % — ABNORMAL LOW (ref 39.0–52.0)
Hemoglobin: 12.3 g/dL — ABNORMAL LOW (ref 13.0–17.0)
MCH: 25.3 pg — ABNORMAL LOW (ref 26.0–34.0)
MCHC: 31.9 g/dL (ref 30.0–36.0)
MCV: 79.4 fL — ABNORMAL LOW (ref 80.0–100.0)
Platelets: 218 10*3/uL (ref 150–400)
RBC: 4.86 MIL/uL (ref 4.22–5.81)
RDW: 14.5 % (ref 11.5–15.5)
WBC: 9.8 10*3/uL (ref 4.0–10.5)
nRBC: 0 % (ref 0.0–0.2)

## 2019-01-20 LAB — OCCULT BLOOD X 1 CARD TO LAB, STOOL: Fecal Occult Bld: POSITIVE — AB

## 2019-01-20 MED ORDER — INSULIN ASPART 100 UNIT/ML ~~LOC~~ SOLN
0.0000 [IU] | SUBCUTANEOUS | Status: DC
  Administered 2019-01-20: 15 [IU] via SUBCUTANEOUS
  Administered 2019-01-20: 5 [IU] via SUBCUTANEOUS
  Administered 2019-01-20: 2 [IU] via SUBCUTANEOUS
  Administered 2019-01-20 – 2019-01-21 (×2): 11 [IU] via SUBCUTANEOUS
  Administered 2019-01-21: 3 [IU] via SUBCUTANEOUS
  Administered 2019-01-21 (×2): 5 [IU] via SUBCUTANEOUS

## 2019-01-20 MED ORDER — POTASSIUM CHLORIDE 10 MEQ/100ML IV SOLN
10.0000 meq | INTRAVENOUS | Status: AC
  Administered 2019-01-20 (×4): 10 meq via INTRAVENOUS
  Filled 2019-01-20 (×4): qty 100

## 2019-01-20 MED ORDER — HYDROCODONE-ACETAMINOPHEN 5-325 MG PO TABS
1.0000 | ORAL_TABLET | Freq: Four times a day (QID) | ORAL | Status: DC | PRN
  Administered 2019-01-20 – 2019-01-21 (×2): 2 via ORAL
  Filled 2019-01-20 (×2): qty 2

## 2019-01-20 MED ORDER — MECLIZINE HCL 25 MG PO TABS
25.0000 mg | ORAL_TABLET | Freq: Three times a day (TID) | ORAL | Status: DC
  Administered 2019-01-20 – 2019-01-23 (×10): 25 mg via ORAL
  Filled 2019-01-20 (×11): qty 1

## 2019-01-20 MED ORDER — ACETAMINOPHEN 325 MG PO TABS
650.0000 mg | ORAL_TABLET | Freq: Four times a day (QID) | ORAL | Status: DC | PRN
  Administered 2019-01-20: 650 mg via ORAL
  Filled 2019-01-20: qty 2

## 2019-01-20 MED ORDER — INSULIN ASPART 100 UNIT/ML ~~LOC~~ SOLN
20.0000 [IU] | Freq: Once | SUBCUTANEOUS | Status: AC
  Administered 2019-01-20: 20 [IU] via SUBCUTANEOUS

## 2019-01-20 MED ORDER — CHLORHEXIDINE GLUCONATE CLOTH 2 % EX PADS
6.0000 | MEDICATED_PAD | Freq: Every day | CUTANEOUS | Status: DC
  Administered 2019-01-20: 6 via TOPICAL

## 2019-01-20 MED ORDER — INSULIN GLARGINE 100 UNIT/ML ~~LOC~~ SOLN
15.0000 [IU] | Freq: Every day | SUBCUTANEOUS | Status: DC
  Administered 2019-01-20: 15 [IU] via SUBCUTANEOUS
  Filled 2019-01-20 (×2): qty 0.15

## 2019-01-20 MED ORDER — BISACODYL 5 MG PO TBEC
5.0000 mg | DELAYED_RELEASE_TABLET | Freq: Every day | ORAL | Status: DC | PRN
  Administered 2019-01-20: 5 mg via ORAL
  Filled 2019-01-20: qty 1

## 2019-01-20 MED ORDER — INSULIN ASPART 100 UNIT/ML ~~LOC~~ SOLN
0.0000 [IU] | Freq: Three times a day (TID) | SUBCUTANEOUS | Status: DC
  Administered 2019-01-21: 20 [IU] via SUBCUTANEOUS
  Administered 2019-01-22: 11 [IU] via SUBCUTANEOUS
  Administered 2019-01-22: 7 [IU] via SUBCUTANEOUS
  Administered 2019-01-22: 13:00:00 15 [IU] via SUBCUTANEOUS
  Administered 2019-01-23: 14 [IU] via SUBCUTANEOUS
  Administered 2019-01-23: 4 [IU] via SUBCUTANEOUS

## 2019-01-20 MED ORDER — SODIUM CHLORIDE 0.9 % IV BOLUS
1000.0000 mL | Freq: Once | INTRAVENOUS | Status: AC
  Administered 2019-01-20: 1000 mL via INTRAVENOUS

## 2019-01-20 NOTE — Progress Notes (Signed)
Pt had small bowel movement. Small bloody clot noted. Occult sent.

## 2019-01-20 NOTE — Progress Notes (Signed)
Patient BP dropped to 67/41. M.D. oncall informed. 1L NS bolus and Stat CBC ordered. Will continue to monitor.

## 2019-01-20 NOTE — Progress Notes (Signed)
CRITICAL VALUE ALERT  Critical Value:  K 2.7  Date & Time Notied:  2122 01/20/19  Provider Notified: [place]   Orders Received/Actions taken: 4 runs of K

## 2019-01-20 NOTE — Progress Notes (Signed)
Review for echo order Pt with known severe LV dysfunction on previous echos for sevearl years   I have reviewed echo from March 2019  LVEF is severely depresse, RVEF also abnormal on my review)  Current hx reviewed.   With current COVID pandemic and recomm to minimize patient/staff exposure,  I do not think echocardiogram, with known baseline abnormalities, will add  to current management. WIll cancel order.   Please call with questions.  [name], MD, [facility]

## 2019-01-20 NOTE — Progress Notes (Signed)
CRITICAL VALUE ALERT  Critical Value:  CBG 440  Date & Time Notied:  01/20/2019 at [place]  Provider Notified: Cristal Ford  Orders Received/Actions taken:

## 2019-01-20 NOTE — Evaluation (Signed)
Physical Therapy Evaluation Patient Details Name: Jeffrey [name] MRN: [id number] DOB: 1975/12/28 Today's Date: 01/20/2019   History of Present Illness  Pt admitted with uncontrolled DM - BS on admit 1412.  Pt with hx of DM, CKD and partial nephrectomy.  Clinical Impression  Pt admitted as above and presenting with functional mobility limitations 2* obesity, generalized LE weakness, bilat knee pain 2* OA changes, and balance deficits associated with peripheral neuropathy bilat feet.  Pt motivated and should progress to dc home with family assist.    Follow Up Recommendations No PT follow up    Equipment Recommendations  Rolling walker with 5" wheels(WIde RW please - pt is 5'7 and close to to 280lbs)    Recommendations for Other Services       Precautions / Restrictions Precautions Precautions: Fall Precaution Comments: Pt with peripheral neuropathy bil feet.      Mobility  Bed Mobility Overal bed mobility: Modified Independent             General bed mobility comments: Increased time but no physical assist  Transfers Overall transfer level: Needs assistance Equipment used: Rolling walker (2 wheeled) Transfers: Sit to/from Stand Sit to Stand: Min guard         General transfer comment: steady assist  Ambulation/Gait Ambulation/Gait assistance: Min assist;Min guard Gait Distance (Feet): 430 Feet Assistive device: Rolling walker (2 wheeled) Gait Pattern/deviations: Step-through pattern;Decreased step length - right;Decreased step length - left;Shuffle;Trunk flexed     General Gait Details: cues for posture and position from RW.  Stairs            Wheelchair Mobility    Modified Rankin (Stroke Patients Only)       Balance Overall balance assessment: Needs assistance Sitting-balance support: No upper extremity supported;Feet supported Sitting balance-Leahy Scale: Good     Standing balance support: Bilateral upper extremity supported Standing  balance-Leahy Scale: Fair Standing balance comment: Pt ltd by peripheral neuropathy bilat feet "my feet are just tingling"                             Pertinent Vitals/Pain Pain Assessment: Faces Faces Pain Scale: Hurts even more Pain Location: bilat knees Pain Descriptors / Indicators: Aching;Sore Pain Intervention(s): Limited activity within patient's tolerance;Monitored during session;Patient requesting pain meds-RN notified    Home Living Family/patient expects to be discharged to:: Private residence Living Arrangements: Spouse/significant other Available Help at Discharge: Family Type of Home: House Home Access: Stairs to enter Entrance Stairs-Rails: Right Entrance Stairs-Number of Steps: 4 Home Layout: One level Home Equipment: None      Prior Function Level of Independence: Independent               Hand Dominance        Extremity/Trunk Assessment   Upper Extremity Assessment Upper Extremity Assessment: Overall WFL for tasks assessed    Lower Extremity Assessment Lower Extremity Assessment: Generalized weakness;RLE deficits/detail;LLE deficits/detail RLE Sensation: history of peripheral neuropathy LLE Sensation: history of peripheral neuropathy    Cervical / Trunk Assessment Cervical / Trunk Assessment: Normal  Communication   Communication: No difficulties  Cognition Arousal/Alertness: Awake/alert Behavior During Therapy: WFL for tasks assessed/performed Overall Cognitive Status: Within Functional Limits for tasks assessed  General Comments      Exercises     Assessment/Plan    PT Assessment Patient needs continued PT services  PT Problem List Decreased strength;Decreased activity tolerance;Decreased balance;Decreased mobility;Decreased knowledge of use of DME;Obesity;Pain       PT Treatment Interventions DME instruction;Gait training;Stair training;Functional mobility  training;Therapeutic activities;Therapeutic exercise;Patient/family education    PT Goals (Current goals can be found in the Care Plan section)  Acute Rehab PT Goals Patient Stated Goal: Regain IND and feel my feet again PT Goal Formulation: With patient Time For Goal Achievement: 02/03/19 Potential to Achieve Goals: Good    Frequency Min 3X/week   Barriers to discharge        Co-evaluation               AM-PAC PT "6 Clicks" Mobility  Outcome Measure Help needed turning from your back to your side while in a flat bed without using bedrails?: None Help needed moving from lying on your back to sitting on the side of a flat bed without using bedrails?: None Help needed moving to and from a bed to a chair (including a wheelchair)?: A Little Help needed standing up from a chair using your arms (e.g., wheelchair or bedside chair)?: A Little Help needed to walk in hospital room?: A Little Help needed climbing 3-5 steps with a railing? : A Little 6 Click Score: 20    End of Session   Activity Tolerance: Patient tolerated treatment well Patient left: in chair;with call [place]/phone within reach;with chair alarm set Nurse Communication: Mobility status PT Visit Diagnosis: Difficulty in walking, not elsewhere classified (R26.2);Pain;Muscle weakness (generalized) (M62.81) Pain - Right/Left: (bilat) Pain - part of body: Knee    Time: 1517-6160 PT Time Calculation (min) (ACUTE ONLY): 18 min   Charges:   PT Evaluation $PT Eval Low Complexity: 1 Low          [place] Pager [phone] Office [phone]   Damonta Cossey 01/20/2019, 4:38 PM

## 2019-01-20 NOTE — Progress Notes (Signed)
PROGRESS NOTE    [name]  GMW:[id number] DOB: [birth year] DOA: 01/19/2019 PCP: [name], FNP   Brief Narrative:  HPI On 01/20/2019 by Dr. Debbe Odea Jeffrey [name] is a 43 y.o. 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 He has not checked his sugars in 2 months. In the ED his sugars is 1412. He has had a sore throat for about 3 wks. He was treated with a course of Amoxicillin by his PCP for strep throat (per Epic, it was Augmentin for 7 days). The rapid strep screen was negative but the culture was positive. He has continued to have a severely sore throat despite treatment.  He has severe thirst and has been drinking and urinating non-stop. He has been checking the time and is urinating every hour.  He has had a runny nose and is being treated for seasonal allergies. He has an occasional feeling of fullness in his ears. No cough, dyspnea or fever noted.  He has not had a BM in 2 wks but also states he has not had solid food. Per his PCP note in March, he has been juicing.   He has been taking his insulin and all of his other medications.  Assessment & Plan   Hyperosmolar nonketotic state/uncontrolled diabetes mellitus, type II -Patient presented with a glucose of 1412, anion gap was unremarkable -She admits to having a broken glucometer for the past several months and was not able to check his blood sugars.  He does state that he takes insulin injections as well as pills but does not know what those pills are.  He also has been juicing and trying to lose weight.  Of note, patient was recently given antibiotics for suspected strep throat. -He was placed on glucose stabilizer.  Blood sugars have now improved, and patient has been transitioned to insulin sliding scale -Last hemoglobin A1c was 11.7 on  11/28/2018.  Pending repeat hemoglobin A1c -Had long discussion with patient regarding the need for closer blood sugar monitoring -Will consult nutrition -Continue to monitor CBGs  Constipation -Patient states he has not had a bowel movement in 2 weeks, however past and bloody stool this morning -Abdominal x-ray obtained showing stool -Place patient on stool softener and continue to monitor  Bright red blood per rectum -Hemoglobin currently 12.3 (was 14 on admission) -Suspect drop in hemoglobin due to IV fluids -Bright red blood per rectum possibly due to fissure and straining.  Patient is on had a bowel movement in 2 weeks -FOBT has been ordered -Will continue to monitor CBC  Hypotension -Overnight, patient had systolic blood pressures ranging from 60-80, he was given several fluid boluses.  He is received approximately 5 L of fluid since admission -Blood pressure currently stable -Discontinued Coreg -Will continue to monitor closely  -Echocardiogram was ordered for hypotension, however cardiology has canceled this order.  Chronic systolic congestive heart failure -Echocardiogram 12/31/2017 shows an EF of 25 to 30%.  Diffuse hypokinesis -BNP on admission 25.4 -Patient does not appear to be volume overloaded.  Currently compensated and euvolemic -Monitor intake and output, daily weights  Acute kidney injury on chronic kidney disease, stage III -Creatinine on admission was 2.94; Baseline creatinine approximately 1.5 -Suspect secondary to prerenal causes and hypovolemia -Creatinine now down to 1.89 -Continue to monitor  BMP  Diabetic neuropathy -Complains of numbness and tingling in his feet. -Continue gabapentin  Hyperkalemia, now hypokalemia -Suspect secondary to the above.  We will continue to monitor and replace potassium as needed  Paroxysmal atrial fibrillation -Continue amiodarone and Eliquis -Coreg held due to hypotension -Patient currently in normal sinus rhythm   History of ventricular tachycardia -Patient with AICD in place  Morbid obesity/obstructive sleep apnea -BMI 42.23 -Patient to follow-up with PCP to discuss lifestyle modifications including exercise and weight management -Continue CPAP  Renal cell carcinoma -Status post right nephrectomy  Gouty arthritis -Stable, Continue allopurinol  DVT Prophylaxis Eliquis  Code Status: Full  Family Communication: None at bedside  Disposition Plan: Admitted.  Pending improvement and blood pressure.  Suspect home upon discharge  Consultants None  Procedures  None  Antibiotics   Anti-infectives (From admission, onward)   Start     Dose/Rate Route Frequency Ordered Stop   01/19/19 1730  fluconazole (DIFLUCAN) IVPB 200 mg     200 mg 100 mL/hr over 60 Minutes Intravenous Every 24 hours 01/19/19 1451        Subjective:   Jeffrey [name] seen and examined today.  Complains of tingling and numbness in his feet.  Patient denies current chest pain, shortness of breath, abdominal pain, nausea or vomiting, diarrhea, dizziness or headache.  He does state that he tried to have a bowel movement earlier this morning and noted bright red blood.  He would like to try eating something. Objective:   Vitals:   01/20/19 0600 01/20/19 0616 01/20/19 0700 01/20/19 0815  BP: (!) 78/51 (!) 82/50 100/60   Pulse: 60 (!) 59 62   Resp: 18 20 20    Temp:    97.9 F (36.6 C)  TempSrc:    Oral  SpO2: 94% 95% 96%   Weight:      Height:        Intake/Output Summary (Last 24 hours) at 01/20/2019 4403 Last data filed at 01/20/2019 0401 Gross per 24 hour  Intake 3251.39 ml  Output 1200 ml  Net 2051.39 ml   Filed Weights   01/19/19 1225  Weight: 129.7 kg    Exam  General: Well developed, well nourished, NAD, appears stated age  43: NCAT, mucous membranes moist.   Neck: Supple  Cardiovascular: S1 S2 auscultated, RRR  Respiratory: Clear to auscultation bilaterally with equal chest rise  Abdomen:  Soft, obese, nontender, nondistended, + bowel sounds  Extremities: warm dry without cyanosis clubbing or edema  Neuro: AAOx3, nonfocal  Psych: Pleasant, appropriate mood and affect   Data Reviewed: I have personally reviewed following labs and imaging studies  CBC: Recent Labs  Lab 01/19/19 1250 01/20/19 0645  WBC 8.6 9.8  HGB 14.0 12.3*  HCT 45.8 38.6*  MCV 81.3 79.4*  PLT 244 474   Basic Metabolic Panel: Recent Labs  Lab 01/19/19 1250 01/19/19 1826 01/19/19 2203 01/20/19 0235 01/20/19 0645  NA 120* 136 140 141 139  K 5.5* 3.3* 3.5 2.7* 3.2*  CL 85* 101 107 109 109  CO2 23 26 23 23  20*  GLUCOSE 1,412* 665* 301* 152* 233*  BUN 44* 37* 34* 30* 32*  CREATININE 2.94* 2.52* 2.22* 1.95* 1.89*  CALCIUM 9.0 8.5* 8.3* 7.7* 7.6*   GFR: Estimated Creatinine Clearance: 66.5 mL/min (A) (by C-G formula based on SCr of 1.89 mg/dL (H)). Liver Function Tests: Recent Labs  Lab 01/19/19 1250  AST 18  ALT 22  ALKPHOS 109  BILITOT 0.8  PROT 7.6  ALBUMIN 3.7   No results for input(s): LIPASE, AMYLASE in the last 168 hours. No results for input(s): AMMONIA in the last 168 hours. Coagulation Profile: No results for input(s): INR, PROTIME in the last 168 hours. Cardiac Enzymes: Recent Labs  Lab 01/19/19 1250  TROPONINI 0.03*   BNP (last 3 results) No results for input(s): PROBNP in the last 8760 hours. HbA1C: No results for input(s): HGBA1C in the last 72 hours. CBG: Recent Labs  Lab 01/20/19 0053 01/20/19 0155 01/20/19 0258 01/20/19 0402 01/20/19 0751  GLUCAP 140* 149* 132* 138* 250*   Lipid Profile: No results for input(s): CHOL, HDL, LDLCALC, TRIG, CHOLHDL, LDLDIRECT in the last 72 hours. Thyroid Function Tests: Recent Labs    01/19/19 1250  TSH 1.874   Anemia Panel: No results for input(s): VITAMINB12, FOLATE, FERRITIN, TIBC, IRON, RETICCTPCT in the last 72 hours. Urine analysis:    Component Value Date/Time   COLORURINE YELLOW 12/29/2017 1933    APPEARANCEUR CLEAR 12/29/2017 1933   LABSPEC 1.020 12/29/2017 1933   PHURINE 5.0 12/29/2017 1933   GLUCOSEU NEGATIVE 12/29/2017 1933   HGBUR SMALL (A) 12/29/2017 1933   BILIRUBINUR negative 11/21/2018 1550   KETONESUR negative 11/21/2018 [place] 12/29/2017 1933   PROTEINUR negative 11/21/2018 1550   PROTEINUR >=300 (A) 12/29/2017 1933   UROBILINOGEN 0.2 11/21/2018 1550   UROBILINOGEN 0.2 11/01/2017 1128   NITRITE Negative 11/21/2018 1550   NITRITE NEGATIVE 12/29/2017 1933   LEUKOCYTESUR Negative 11/21/2018 1550   Sepsis Labs: @LABRCNTIP (procalcitonin:4,lacticidven:4)  ) Recent Results (from the past 240 hour(s))  MRSA PCR Screening     Status: None   Collection Time: 01/19/19  3:50 PM  Result Value Ref Range Status   MRSA by PCR NEGATIVE NEGATIVE Final    Comment:        The GeneXpert MRSA Assay (FDA approved for NASAL specimens only), is one component of a comprehensive MRSA colonization surveillance program. It is not intended to diagnose MRSA infection nor to guide or monitor treatment for MRSA infections. Performed at [facility], [place] [address], [place], [place] [address]       Radiology Studies: Dg Chest Portable 1 View  Result Date: 01/19/2019 CLINICAL DATA:  Shortness of breath. EXAM: PORTABLE CHEST 1 VIEW COMPARISON:  Radiographs of August 04, 2018. FINDINGS: The heart size and mediastinal contours are within normal limits. Both lungs are clear. No pneumothorax or pleural effusion is noted. Stable position of defibrillator. The visualized skeletal structures are unremarkable. IMPRESSION: No active disease. Electronically Signed   By: Marijo Conception M.D.   On: 01/19/2019 12:47   Dg Abd Portable 1v  Result Date: 01/19/2019 CLINICAL DATA:  Constipation EXAM: PORTABLE ABDOMEN - 1 VIEW COMPARISON:  CT abdomen pelvis 08/22/2017 FINDINGS: Nonobstructive bowel gas pattern. Mild amount of stool in the colon. No abnormal  calcifications. No acute skeletal abnormality. IMPRESSION: Mild stool in the colon. Electronically Signed   By: Franchot Gallo M.D.   On: 01/19/2019 16:02     Scheduled Meds: . allopurinol  100 mg Oral Daily  . amiodarone  200 mg Oral Daily  . apixaban  5 mg Oral BID  . atorvastatin  10 mg Oral Daily  . Chlorhexidine Gluconate Cloth  6 each Topical Daily  . fluticasone  2 spray Each Nare Daily  . gabapentin  300 mg Oral TID  . insulin aspart  0-15 Units Subcutaneous Q4H  . loratadine  10 mg Oral Daily   Continuous Infusions: .  fluconazole (DIFLUCAN) IV Stopped (01/19/19 1739)     LOS: 1 day   Time Spent in minutes   45 minutes  Makael Stein D.O. on 01/20/2019 at 9:42 AM  Between 7am to 7pm - Please see pager noted on [email or web address]  After 7pm go to [email or web address]  And look for the night coverage person covering for me after hours  [facility] Office  [phone]

## 2019-01-20 NOTE — Progress Notes (Signed)
Blood sugar on target range for 4 consecutive hours. M.D. Informed. D/C glucostablizer and transition to sliding scale. Will continue to monitor.

## 2019-01-20 NOTE — Progress Notes (Signed)
Patient reported bright red blood after trying to have a BM this morning.  After patient lay down BP drop to 83/52. On call MD informed. No new orders. Will continue to monitor.

## 2019-01-21 LAB — BASIC METABOLIC PANEL
Anion gap: 9 (ref 5–15)
BUN: 19 mg/dL (ref 6–20)
CO2: 19 mmol/L — ABNORMAL LOW (ref 22–32)
Calcium: 8.4 mg/dL — ABNORMAL LOW (ref 8.9–10.3)
Chloride: 110 mmol/L (ref 98–111)
Creatinine, Ser: 1.42 mg/dL — ABNORMAL HIGH (ref 0.61–1.24)
GFR calc Af Amer: >60 mL/min (ref >60)
GFR calc non Af Amer: 60 mL/min — ABNORMAL LOW (ref >60)
Glucose, Bld: 196 mg/dL — ABNORMAL HIGH (ref 70–99)
Potassium: 3.2 mmol/L — ABNORMAL LOW (ref 3.5–5.1)
Sodium: 138 mmol/L (ref 135–145)

## 2019-01-21 LAB — CBC
HCT: 43.9 % (ref 39.0–52.0)
Hemoglobin: 13.6 g/dL (ref 13.0–17.0)
MCH: 24.6 pg — ABNORMAL LOW (ref 26.0–34.0)
MCHC: 31 g/dL (ref 30.0–36.0)
MCV: 79.4 fL — ABNORMAL LOW (ref 80.0–100.0)
Platelets: 226 10*3/uL (ref 150–400)
RBC: 5.53 MIL/uL (ref 4.22–5.81)
RDW: 14.9 % (ref 11.5–15.5)
WBC: 8.8 10*3/uL (ref 4.0–10.5)
nRBC: 0 % (ref 0.0–0.2)

## 2019-01-21 LAB — GLUCOSE, CAPILLARY
Glucose-Capillary: 190 mg/dL — ABNORMAL HIGH (ref 70–99)
Glucose-Capillary: 205 mg/dL — ABNORMAL HIGH (ref 70–99)
Glucose-Capillary: 207 mg/dL — ABNORMAL HIGH (ref 70–99)
Glucose-Capillary: 318 mg/dL — ABNORMAL HIGH (ref 70–99)
Glucose-Capillary: 342 mg/dL — ABNORMAL HIGH (ref 70–99)
Glucose-Capillary: 356 mg/dL — ABNORMAL HIGH (ref 70–99)

## 2019-01-21 MED ORDER — INSULIN ASPART PROT & ASPART (70-30 MIX) 100 UNIT/ML ~~LOC~~ SUSP
34.0000 [IU] | Freq: Two times a day (BID) | SUBCUTANEOUS | Status: DC
  Administered 2019-01-21 – 2019-01-23 (×4): 34 [IU] via SUBCUTANEOUS
  Filled 2019-01-21: qty 10

## 2019-01-21 MED ORDER — HYDROCODONE-ACETAMINOPHEN 5-325 MG PO TABS
1.0000 | ORAL_TABLET | Freq: Four times a day (QID) | ORAL | Status: DC | PRN
  Administered 2019-01-21 – 2019-01-23 (×7): 2 via ORAL
  Filled 2019-01-21 (×7): qty 2

## 2019-01-21 MED ORDER — ZOLPIDEM TARTRATE 5 MG PO TABS
5.0000 mg | ORAL_TABLET | Freq: Every evening | ORAL | Status: DC | PRN
  Administered 2019-01-21 – 2019-01-22 (×2): 5 mg via ORAL
  Filled 2019-01-21 (×2): qty 1

## 2019-01-21 MED ORDER — CARVEDILOL 3.125 MG PO TABS
3.1250 mg | ORAL_TABLET | Freq: Two times a day (BID) | ORAL | Status: DC
  Administered 2019-01-21 – 2019-01-23 (×5): 3.125 mg via ORAL
  Filled 2019-01-21 (×6): qty 1

## 2019-01-21 MED ORDER — ACETAMINOPHEN 325 MG PO TABS
650.0000 mg | ORAL_TABLET | Freq: Four times a day (QID) | ORAL | Status: DC | PRN
  Administered 2019-01-22 – 2019-01-23 (×3): 650 mg via ORAL
  Filled 2019-01-21 (×3): qty 2

## 2019-01-21 MED ORDER — COLCHICINE 0.6 MG PO TABS
0.6000 mg | ORAL_TABLET | Freq: Three times a day (TID) | ORAL | Status: DC | PRN
  Filled 2019-01-21: qty 1

## 2019-01-21 MED ORDER — INSULIN GLARGINE 100 UNIT/ML ~~LOC~~ SOLN
20.0000 [IU] | Freq: Every day | SUBCUTANEOUS | Status: DC
  Administered 2019-01-21 – 2019-01-23 (×3): 20 [IU] via SUBCUTANEOUS
  Filled 2019-01-21 (×3): qty 0.2

## 2019-01-21 MED ORDER — POTASSIUM CHLORIDE CRYS ER 20 MEQ PO TBCR
40.0000 meq | EXTENDED_RELEASE_TABLET | Freq: Once | ORAL | Status: AC
  Administered 2019-01-21: 14:00:00 40 meq via ORAL
  Filled 2019-01-21: qty 2

## 2019-01-21 MED ORDER — COLCHICINE 0.6 MG PO TABS
0.6000 mg | ORAL_TABLET | Freq: Two times a day (BID) | ORAL | Status: DC | PRN
  Administered 2019-01-21 (×2): 0.6 mg via ORAL
  Filled 2019-01-21 (×4): qty 1

## 2019-01-21 MED ORDER — FLUCONAZOLE 200 MG PO TABS
200.0000 mg | ORAL_TABLET | ORAL | Status: DC
  Administered 2019-01-21 – 2019-01-22 (×2): 200 mg via ORAL
  Filled 2019-01-21 (×3): qty 1

## 2019-01-21 NOTE — Progress Notes (Signed)
PHARMACIST - PHYSICIAN COMMUNICATION DR:   Ree Kida CONCERNING: Antibiotic IV to Oral Route Change Policy  RECOMMENDATION: This patient is receiving Diflucan by the intravenous route.  Based on criteria approved by the Pharmacy and Therapeutics Committee, the antibiotic(s) is/are being converted to the equivalent oral dose form(s).   DESCRIPTION: These criteria include:  Patient being treated for a respiratory tract infection, urinary tract infection, cellulitis or clostridium difficile associated diarrhea if on metronidazole  The patient is not neutropenic and does not exhibit a GI malabsorption state  The patient is eating (either orally or via tube) and/or has been taking other orally administered medications for a least 24 hours  The patient is improving clinically and has a Tmax < 100.5  If you have questions about this conversion, please contact the Pharmacy Department  []   [phone] )  [facility] []   [phone] )  [facility] []   [phone] )  [facility] []   [phone] )  [facility] [x]   [phone] )  [place], PharmD, BCPS 01/21/2019@9 :44 AM

## 2019-01-21 NOTE — Progress Notes (Signed)
Pt took his tele monitor off stated "I don't need it" . It was explained that the blank screen on the monitor reason that the screen went  To sleep.

## 2019-01-21 NOTE — Progress Notes (Signed)
PROGRESS NOTE    Jeffrey [name]  NWG:[id number] DOB: [birth year] DOA: 01/19/2019 PCP: [name], FNP   Brief Narrative:  HPI On 01/20/2019 by Dr. Debbe Odea Jeffrey [name] is a 43 y.o. 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 He has not checked his sugars in 2 months. In the ED his sugars is 1412. He has had a sore throat for about 3 wks. He was treated with a course of Amoxicillin by his PCP for strep throat (per Epic, it was Augmentin for 7 days). The rapid strep screen was negative but the culture was positive. He has continued to have a severely sore throat despite treatment.  He has severe thirst and has been drinking and urinating non-stop. He has been checking the time and is urinating every hour.  He has had a runny nose and is being treated for seasonal allergies. He has an occasional feeling of fullness in his ears. No cough, dyspnea or fever noted.  He has not had a BM in 2 wks but also states he has not had solid food. Per his PCP note in March, he has been juicing.   He has been taking his insulin and all of his other medications.   Interim history Admitted for [place], improving.  Assessment & Plan   Hyperosmolar nonketotic state/uncontrolled diabetes mellitus, type II -Patient presented with a glucose of 1412, anion gap was unremarkable -She admits to having a broken glucometer for the past several months and was not able to check his blood sugars.  He does state that he takes insulin injections as well as pills but does not know what those pills are.  He also has been juicing and trying to lose weight.  Of note, patient was recently given antibiotics for suspected strep throat. -He was placed on glucose stabilizer.  Blood sugars have now improved, and patient has been transitioned to insulin  sliding scale -Last hemoglobin A1c was 11.7 on 11/28/2018.  Pending repeat hemoglobin A1c -Had long discussion with patient regarding the need for closer blood sugar monitoring Nutrition consulted -was placed on lantus and ISS- will transition back to 70/30 this evening -Continue to monitor CBGs  Constipation -Patient states he has not had a bowel movement in 2 weeks, however past and bloody stool this morning -Abdominal x-ray obtained showing stool -Place patient on stool softener and continue to monitor  Bright red blood per rectum -Hemoglobin currently 13.6  (was 14 on admission) -Suspect drop in hemoglobin due to IV fluids -Bright red blood per rectum possibly due to fissure and straining.  Patient is on had a bowel movement in 2 weeks -FOBT positive  -hemoglobin has remained stable  Hypotension -Resolved- Overnight on 01/20/2019, patient had systolic blood pressures ranging from 60-80, he was given several fluid boluses.  He is received approximately 5 L of fluid since admission -Blood pressure currently stable -Echocardiogram was ordered for hypotension, however cardiology has canceled this order.  Essential hypertension -now with elevated BP -will restart coreg  Chronic systolic congestive heart failure -Echocardiogram 12/31/2017 shows an EF of 25 to 30%.  Diffuse hypokinesis -BNP on admission 25.4 -Patient does not appear to be volume overloaded.  Currently compensated and euvolemic -Monitor intake and output, daily weights  Acute kidney injury on chronic kidney disease, stage III -  Creatinine on admission was 2.94; Baseline creatinine approximately 1.5 -Suspect secondary to prerenal causes and hypovolemia -Creatinine now down to 1.42 -Continue to monitor BMP  Diabetic neuropathy -Complains of numbness and tingling in his feet. -Continue gabapentin  Hyperkalemia, now hypokalemia -Suspect secondary to the above.  We will continue to monitor and replace potassium as  needed  Paroxysmal atrial fibrillation -Continue amiodarone and Eliquis -Coreg held due to hypotension -Patient currently in normal sinus rhythm  History of ventricular tachycardia -Patient with AICD in place  Morbid obesity/obstructive sleep apnea -BMI 42.23 -Patient to follow-up with PCP to discuss lifestyle modifications including exercise and weight management -Continue CPAP  Renal cell carcinoma -Status post right nephrectomy  Gouty arthritis -Complaining of knee pain today -Continue allopurinol -will restart colchicine -PT consulted, recommended no further therapy. Rolling walker- wide  Hypokalemia  -will replace and continue to monitor BMP  DVT Prophylaxis Eliquis  Code Status: Full  Family Communication: None at bedside  Disposition Plan: Admitted.  Suspect home in 1-2 days.   Consultants None  Procedures  None  Antibiotics   Anti-infectives (From admission, onward)   Start     Dose/Rate Route Frequency Ordered Stop   01/21/19 1800  fluconazole (DIFLUCAN) tablet 200 mg     200 mg Oral Every 24 hours 01/21/19 0945     01/19/19 1730  fluconazole (DIFLUCAN) IVPB 200 mg  Status:  Discontinued     200 mg 100 mL/hr over 60 Minutes Intravenous Every 24 hours 01/19/19 1451 01/21/19 0945      Subjective:   Jeffrey [name] seen and examined today.  Complains of right knee pain and feel it his gout. States he takes a medication that starts with "m". Denies chest pain, shortness of breath, abdominal pain, nausea or vomiting, diarrhea or constipation, dizziness or headache. Objective:   Vitals:   01/20/19 2045 01/21/19 0026 01/21/19 0409 01/21/19 0955  BP: 125/83 (!) 149/100 (!) 154/98 137/90  Pulse: 77 79 63 81  Resp: 18 17 18 18   Temp: 98.5 F (36.9 C) 98.8 F (37.1 C) 99 F (37.2 C) 98.8 F (37.1 C)  TempSrc: Oral Oral Oral Oral  SpO2: 100% 100% 100% 94%  Weight:      Height:        Intake/Output Summary (Last 24 hours) at 01/21/2019 1124 Last data  filed at 01/20/2019 2300 Gross per 24 hour  Intake 220.14 ml  Output -  Net 220.14 ml   Filed Weights   01/19/19 1225  Weight: 129.7 kg   Exam  General: Well developed, well nourished, NAD, appears stated age  71: NCAT, mucous membranes moist.   Cardiovascular: S1 S2 auscultated, RRR  Respiratory: Clear to auscultation bilaterally with equal chest rise  Abdomen: Soft, obese, nontender, nondistended, + bowel sounds  Extremities: warm dry without cyanosis clubbing or edema  Neuro: AAOx3, nonfocal  Psych: Pleasant, appropriate mood and affect   Data Reviewed: I have personally reviewed following labs and imaging studies  CBC: Recent Labs  Lab 01/19/19 1250 01/20/19 0645 01/21/19 0450  WBC 8.6 9.8 8.8  HGB 14.0 12.3* 13.6  HCT 45.8 38.6* 43.9  MCV 81.3 79.4* 79.4*  PLT 244 218 767   Basic Metabolic Panel: Recent Labs  Lab 01/19/19 2203 01/20/19 0235 01/20/19 0645 01/20/19 1031 01/21/19 0450  NA 140 141 139 134* 138  K 3.5 2.7* 3.2* 3.3* 3.2*  CL 107 109 109 106 110  CO2 23 23 20* 18* 19*  GLUCOSE 301* 152* 233* 478*  196*  BUN 34* 30* 32* 29* 19  CREATININE 2.22* 1.95* 1.89* 1.88* 1.42*  CALCIUM 8.3* 7.7* 7.6* 7.4* 8.4*   GFR: Estimated Creatinine Clearance: 88.5 mL/min (A) (by C-G formula based on SCr of 1.42 mg/dL (H)). Liver Function Tests: Recent Labs  Lab 01/19/19 1250  AST 18  ALT 22  ALKPHOS 109  BILITOT 0.8  PROT 7.6  ALBUMIN 3.7   No results for input(s): LIPASE, AMYLASE in the last 168 hours. No results for input(s): AMMONIA in the last 168 hours. Coagulation Profile: No results for input(s): INR, PROTIME in the last 168 hours. Cardiac Enzymes: Recent Labs  Lab 01/19/19 1250  TROPONINI 0.03*   BNP (last 3 results) No results for input(s): PROBNP in the last 8760 hours. HbA1C: No results for input(s): HGBA1C in the last 72 hours. CBG: Recent Labs  Lab 01/20/19 1529 01/20/19 2038 01/20/19 2358 01/21/19 0454 01/21/19  0806  GLUCAP 503* 346* 231* 207* 190*   Lipid Profile: No results for input(s): CHOL, HDL, LDLCALC, TRIG, CHOLHDL, LDLDIRECT in the last 72 hours. Thyroid Function Tests: Recent Labs    01/19/19 1250  TSH 1.874   Anemia Panel: No results for input(s): VITAMINB12, FOLATE, FERRITIN, TIBC, IRON, RETICCTPCT in the last 72 hours. Urine analysis:    Component Value Date/Time   COLORURINE YELLOW 12/29/2017 1933   APPEARANCEUR CLEAR 12/29/2017 1933   LABSPEC 1.020 12/29/2017 1933   PHURINE 5.0 12/29/2017 1933   GLUCOSEU NEGATIVE 12/29/2017 1933   HGBUR SMALL (A) 12/29/2017 1933   BILIRUBINUR negative 11/21/2018 1550   KETONESUR negative 11/21/2018 [place] 12/29/2017 1933   PROTEINUR negative 11/21/2018 1550   PROTEINUR >=300 (A) 12/29/2017 1933   UROBILINOGEN 0.2 11/21/2018 1550   UROBILINOGEN 0.2 11/01/2017 1128   NITRITE Negative 11/21/2018 1550   NITRITE NEGATIVE 12/29/2017 1933   LEUKOCYTESUR Negative 11/21/2018 1550   Sepsis Labs: @LABRCNTIP (procalcitonin:4,lacticidven:4)  ) Recent Results (from the past 240 hour(s))  MRSA PCR Screening     Status: None   Collection Time: 01/19/19  3:50 PM  Result Value Ref Range Status   MRSA by PCR NEGATIVE NEGATIVE Final    Comment:        The GeneXpert MRSA Assay (FDA approved for NASAL specimens only), is one component of a comprehensive MRSA colonization surveillance program. It is not intended to diagnose MRSA infection nor to guide or monitor treatment for MRSA infections. Performed at [facility], [place] [address], [place], [place] [address]       Radiology Studies: Dg Chest Portable 1 View  Result Date: 01/19/2019 CLINICAL DATA:  Shortness of breath. EXAM: PORTABLE CHEST 1 VIEW COMPARISON:  Radiographs of August 04, 2018. FINDINGS: The heart size and mediastinal contours are within normal limits. Both lungs are clear. No pneumothorax or pleural effusion is noted. Stable position  of defibrillator. The visualized skeletal structures are unremarkable. IMPRESSION: No active disease. Electronically Signed   By: Marijo Conception M.D.   On: 01/19/2019 12:47   Dg Abd Portable 1v  Result Date: 01/19/2019 CLINICAL DATA:  Constipation EXAM: PORTABLE ABDOMEN - 1 VIEW COMPARISON:  CT abdomen pelvis 08/22/2017 FINDINGS: Nonobstructive bowel gas pattern. Mild amount of stool in the colon. No abnormal calcifications. No acute skeletal abnormality. IMPRESSION: Mild stool in the colon. Electronically Signed   By: Franchot Gallo M.D.   On: 01/19/2019 16:02     Scheduled Meds: . allopurinol  100 mg Oral Daily  . amiodarone  200 mg  Oral Daily  . apixaban  5 mg Oral BID  . atorvastatin  10 mg Oral Daily  . carvedilol  3.125 mg Oral BID WC  . Chlorhexidine Gluconate Cloth  6 each Topical Daily  . fluconazole  200 mg Oral Q24H  . fluticasone  2 spray Each Nare Daily  . gabapentin  300 mg Oral TID  . insulin aspart  0-15 Units Subcutaneous Q4H  . insulin aspart  0-20 Units Subcutaneous TID WC  . insulin aspart protamine- aspart  34 Units Subcutaneous BID WC  . insulin glargine  20 Units Subcutaneous Daily  . loratadine  10 mg Oral Daily  . meclizine  25 mg Oral TID   Continuous Infusions:    LOS: 2 days   Time Spent in minutes   30 minutes  Anahit Klumb D.O. on 01/21/2019 at 11:24 AM  Between 7am to 7pm - Please see pager noted on [email or web address]  After 7pm go to [email or web address]  And look for the night coverage person covering for me after hours  [facility] Office  [phone]

## 2019-01-21 NOTE — Progress Notes (Signed)
Inpatient Diabetes Program Recommendations  AACE/ADA: New Consensus Statement on Inpatient Glycemic Control (2015)  Target Ranges:  Prepandial:   less than 140 mg/dL      Peak postprandial:   less than 180 mg/dL (1-2 hours)      Critically ill patients:  140 - 180 mg/dL   Lab Results  Component Value Date   GLUCAP 318 (H) 01/21/2019   HGBA1C 11.7 (H) 11/28/2018    Review of Glycemic Control  Diabetes history: DM2 Outpatient Diabetes medications: 70/30 34 units bid Current orders for Inpatient glycemic control: 70/30 34 units bid, Novolog 0-20 units tidwc and hs  HgbA1C 11.7% - uncontrolled. 6.1% on 07/25/2018. Pt admits to not checking blood sugars x 2 months. Prescription for glucose meter called in on 01/09/2019.  Inpatient Diabetes Program Recommendations:     Add HS correction.  At discharge, woiuld also recommend Novolin R 0-20 units tidwc and hs for glucose excursions. Will need to f/u with [facility] within a week or two of discharge. Stress importance of lifestyle modifications (diet, exercise and stress management) along with insulin for improved glucose control.  Will speak with pt by phone in am regarding his diabetes management.  Thank you. Lorenda Peck, RD, LDN, CDE Inpatient Diabetes Coordinator [phone]

## 2019-01-21 NOTE — Progress Notes (Signed)
PT Cancellation Note  Patient Details Name: Jeffrey [name] MRN: [id number] DOB: May 01, 1976   Cancelled Treatment:     PT attempted but deferred at pt request 2* R knee pain.  Pt states he has gout and they are going to start Colchicine today.  Will follow.   Maday Guarino 01/21/2019, 10:39 AM

## 2019-01-22 LAB — BASIC METABOLIC PANEL
Anion gap: 6 (ref 5–15)
BUN: 11 mg/dL (ref 6–20)
CO2: 22 mmol/L (ref 22–32)
Calcium: 8.7 mg/dL — ABNORMAL LOW (ref 8.9–10.3)
Chloride: 110 mmol/L (ref 98–111)
Creatinine, Ser: 1.17 mg/dL (ref 0.61–1.24)
GFR calc Af Amer: >60 mL/min (ref >60)
GFR calc non Af Amer: >60 mL/min (ref >60)
Glucose, Bld: 232 mg/dL — ABNORMAL HIGH (ref 70–99)
Potassium: 3.5 mmol/L (ref 3.5–5.1)
Sodium: 138 mmol/L (ref 135–145)

## 2019-01-22 LAB — HEMOGLOBIN AND HEMATOCRIT, BLOOD
HCT: 39.8 % (ref 39.0–52.0)
Hemoglobin: 12.4 g/dL — ABNORMAL LOW (ref 13.0–17.0)

## 2019-01-22 LAB — GLUCOSE, CAPILLARY
Glucose-Capillary: 214 mg/dL — ABNORMAL HIGH (ref 70–99)
Glucose-Capillary: 214 mg/dL — ABNORMAL HIGH (ref 70–99)
Glucose-Capillary: 253 mg/dL — ABNORMAL HIGH (ref 70–99)
Glucose-Capillary: 255 mg/dL — ABNORMAL HIGH (ref 70–99)

## 2019-01-22 MED ORDER — NAPHAZOLINE-PHENIRAMINE 0.025-0.3 % OP SOLN
1.0000 [drp] | Freq: Four times a day (QID) | OPHTHALMIC | Status: DC | PRN
  Administered 2019-01-22: 1 [drp] via OPHTHALMIC
  Filled 2019-01-22: qty 15

## 2019-01-22 MED ORDER — COLCHICINE 0.6 MG PO TABS
0.6000 mg | ORAL_TABLET | Freq: Three times a day (TID) | ORAL | Status: DC | PRN
  Administered 2019-01-22 – 2019-01-23 (×3): 0.6 mg via ORAL
  Filled 2019-01-22 (×4): qty 1

## 2019-01-22 MED ORDER — KETOROLAC TROMETHAMINE 30 MG/ML IJ SOLN
30.0000 mg | Freq: Once | INTRAMUSCULAR | Status: AC
  Administered 2019-01-22: 30 mg via INTRAVENOUS
  Filled 2019-01-22: qty 1

## 2019-01-22 MED ORDER — HYDRALAZINE HCL 20 MG/ML IJ SOLN
10.0000 mg | Freq: Four times a day (QID) | INTRAMUSCULAR | Status: DC | PRN
  Administered 2019-01-22: 10 mg via INTRAVENOUS
  Filled 2019-01-22: qty 1

## 2019-01-22 NOTE — Progress Notes (Signed)
Inpatient Diabetes Program Recommendations  AACE/ADA: New Consensus Statement on Inpatient Glycemic Control (2015)  Target Ranges:  Prepandial:   less than 140 mg/dL      Peak postprandial:   less than 180 mg/dL (1-2 hours)      Critically ill patients:  140 - 180 mg/dL   Lab Results  Component Value Date   GLUCAP 214 (H) 01/22/2019   HGBA1C 11.7 (H) 11/28/2018    Review of Glycemic Control  Spoke with pt on phone about his glucose control at home. Discussed HgbA1C of 11.7% and pt states "I know I need to have it around 6-7%." Pt states he was trying to lose weight and started juicing recently. Continued to take his 70/30 34 units bid, although could not check his blood sugars because he didn't have a meter. States he was supposed to get prescription from PCP on day he was admitted. Discussed importance of monitoring at least 3x/day and taking logbook to MD for any needed adjustments. Discussed diet and exercise and how they impact blood sugars. Pt states he has started walking a mile each day. Also talked about how juicing is not the best thing for people with diabetes d/t all simple CHOs, no protein. 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 Answered questions and pt very appreciative of phone call.   Inpatient Diabetes Program Recommendations:     Increase 70/30 to 38 units bid D/C Lantus For discharge - Novolin R 0-20 units tidwc and hs (sliding scale for                           glucose excursions) Prescription for glucose meter.  Continue to follow.  Thank you. Lorenda Peck, RD, LDN, CDE Inpatient Diabetes Coordinator [phone]

## 2019-01-22 NOTE — Progress Notes (Addendum)
PROGRESS NOTE    Jeffrey [name]  MEQ:[id number] DOB: 1975/12/19 DOA: 01/19/2019 PCP: [name], FNP   Brief Narrative:  HPI On 01/20/2019 by Dr. Debbe Odea Jeffrey [name] is a 43 y.o. 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 He has not checked his sugars in 2 months. In the ED his sugars is 1412. He has had a sore throat for about 3 wks. He was treated with a course of Amoxicillin by his PCP for strep throat (per Epic, it was Augmentin for 7 days). The rapid strep screen was negative but the culture was positive. He has continued to have a severely sore throat despite treatment.  He has severe thirst and has been drinking and urinating non-stop. He has been checking the time and is urinating every hour.  He has had a runny nose and is being treated for seasonal allergies. He has an occasional feeling of fullness in his ears. No cough, dyspnea or fever noted.  He has not had a BM in 2 wks but also states he has not had solid food. Per his PCP note in March, he has been juicing.   He has been taking his insulin and all of his other medications.   Interim history Admitted for [place], improving. Now complaining of gout.  Assessment & Plan   Hyperosmolar nonketotic state/uncontrolled diabetes mellitus, type II -Patient presented with a glucose of 1412, anion gap was unremarkable -She admits to having a broken glucometer for the past several months and was not able to check his blood sugars.  He does state that he takes insulin injections as well as pills but does not know what those pills are.  He also has been juicing and trying to lose weight.  Of note, patient was recently given antibiotics for suspected strep throat. -He was placed on glucose stabilizer.  Blood sugars have now improved, and patient has  been transitioned to insulin sliding scale -Last hemoglobin A1c was 11.7 on 11/28/2018.  Pending repeat hemoglobin A1c- will reorder today (appears lab was ordered on admission, but still pending) -Had long discussion with patient regarding the need for closer blood sugar monitoring Nutrition consulted -transitioned to novolog 70/30 with ISS -Continue to monitor CBGs  Constipation -Patient states he has not had a bowel movement in 2 weeks, however past and bloody stool this morning -Abdominal x-ray obtained showing stool -Place patient on stool softener and continue to monitor  Bright red blood per rectum -Hemoglobin currently 12.4  (was 14 on admission) -Suspect drop in hemoglobin due to IV fluids -Bright red blood per rectum possibly due to fissure and straining.  Patient is on had a bowel movement in 2 weeks -FOBT positive  -hemoglobin has remained stable  Hypotension -Resolved- Overnight on 01/20/2019, patient had systolic blood pressures ranging from 60-80, he was given several fluid boluses.  He is received approximately 5 L of fluid since admission -Blood pressure currently stable -Echocardiogram was ordered for hypotension, however cardiology has canceled this order.  Essential hypertension -Continue coreg -currently not controlled- likely due to pain  Chronic systolic congestive heart failure -Echocardiogram 12/31/2017 shows an EF of 25 to 30%.  Diffuse hypokinesis -BNP on admission 25.4 -Patient does not appear to be volume overloaded.  Currently compensated and euvolemic -Monitor intake and output, daily  weights  Acute kidney injury on chronic kidney disease, stage III -resolved -Creatinine on admission was 2.94; Baseline creatinine approximately 1.5 -Suspect secondary to prerenal causes and hypovolemia -Creatinine now down to 1.17 -Continue to monitor BMP  Diabetic neuropathy -Complains of numbness and tingling in his feet. -Continue gabapentin  Hyperkalemia, now  hypokalemia -Suspect secondary to the above.   -continue to monitor BMP and treat as needed  Paroxysmal atrial fibrillation -Continue amiodarone and Eliquis -Coreg  Initially held due to hypotension -Patient currently in normal sinus rhythm  History of ventricular tachycardia -Patient with AICD in place  Morbid obesity/obstructive sleep apnea -BMI 42.23 -Patient to follow-up with PCP to discuss lifestyle modifications including exercise and weight management -Continue CPAP  Renal cell carcinoma -Status post right nephrectomy  Gouty arthritis -Complaining of knee pain today -Continue allopurinol -Continue colchicine, increasing dose to to TID PRN -Will also order one dose of toradol for pain -PT consulted, recommended no further therapy. Rolling walker- wide  Oral thrush -Continue diflucan  DVT Prophylaxis Eliquis  Code Status: Full  Family Communication: None at bedside  Disposition Plan: Admitted.  Suspect home in 1-2 days.   Consultants None  Procedures  None  Antibiotics   Anti-infectives (From admission, onward)   Start     Dose/Rate Route Frequency Ordered Stop   01/21/19 1800  fluconazole (DIFLUCAN) tablet 200 mg     200 mg Oral Every 24 hours 01/21/19 0945     01/19/19 1730  fluconazole (DIFLUCAN) IVPB 200 mg  Status:  Discontinued     200 mg 100 mL/hr over 60 Minutes Intravenous Every 24 hours 01/19/19 1451 01/21/19 0945      Subjective:   [name] seen and examined today.  No longer complains of right knee pain, however now complaining of left ankle pain.  Feels that he needs more colchicine.  Denies current chest pain, shortness of breath, abdominal pain, nausea or vomiting, diarrhea constipation, dizziness or headache.   Objective:   Vitals:   01/21/19 2015 01/21/19 2031 01/21/19 2338 01/22/19 0409  BP: (!) 148/101  (!) 127/91 (!) 152/112  Pulse: 77 82 72 77  Resp:  20 18 18   Temp:   98.7 F (37.1 C) 99 F (37.2 C)  TempSrc:   Oral Oral   SpO2:  97% 96% 98%  Weight:      Height:        Intake/Output Summary (Last 24 hours) at 01/22/2019 0910 Last data filed at 01/22/2019 0410 Gross per 24 hour  Intake 1320 ml  Output 725 ml  Net 595 ml   Filed Weights   01/19/19 1225  Weight: 129.7 kg   Exam  General: Well developed, well nourished, NAD, appears stated age  HEENT: NCAT,  mucous membranes moist.   Cardiovascular: S1 S2 auscultated, RRR  Respiratory: Clear to auscultation bilaterally with equal chest rise  Abdomen: Soft, obese, nontender, nondistended, + bowel sounds  Extremities: warm dry without cyanosis clubbing or edema. Left ankle TTP  Neuro: AAOx3, nonfocal  Psych: Normal affect and demeanor with intact judgement and insight  Data Reviewed: I have personally reviewed following labs and imaging studies  CBC: Recent Labs  Lab 01/19/19 1250 01/20/19 0645 01/21/19 0450 01/22/19 0505  WBC 8.6 9.8 8.8  --   HGB 14.0 12.3* 13.6 12.4*  HCT 45.8 38.6* 43.9 39.8  MCV 81.3 79.4* 79.4*  --   PLT 244 218 226  --    Basic Metabolic Panel: Recent Labs  Lab 01/20/19  6503 01/20/19 0645 01/20/19 1031 01/21/19 0450 01/22/19 0505  NA 141 139 134* 138 138  K 2.7* 3.2* 3.3* 3.2* 3.5  CL 109 109 106 110 110  CO2 23 20* 18* 19* 22  GLUCOSE 152* 233* 478* 196* 232*  BUN 30* 32* 29* 19 11  CREATININE 1.95* 1.89* 1.88* 1.42* 1.17  CALCIUM 7.7* 7.6* 7.4* 8.4* 8.7*   GFR: Estimated Creatinine Clearance: 107.5 mL/min (by C-G formula based on SCr of 1.17 mg/dL). Liver Function Tests: Recent Labs  Lab 01/19/19 1250  AST 18  ALT 22  ALKPHOS 109  BILITOT 0.8  PROT 7.6  ALBUMIN 3.7   No results for input(s): LIPASE, AMYLASE in the last 168 hours. No results for input(s): AMMONIA in the last 168 hours. Coagulation Profile: No results for input(s): INR, PROTIME in the last 168 hours. Cardiac Enzymes: Recent Labs  Lab 01/19/19 1250  TROPONINI 0.03*   BNP (last 3 results) No results for input(s):  PROBNP in the last 8760 hours. HbA1C: No results for input(s): HGBA1C in the last 72 hours. CBG: Recent Labs  Lab 01/21/19 1702 01/21/19 1950 01/21/19 2337 01/22/19 0406 01/22/19 0808  GLUCAP 356* 342* 205* 214* 214*   Lipid Profile: No results for input(s): CHOL, HDL, LDLCALC, TRIG, CHOLHDL, LDLDIRECT in the last 72 hours. Thyroid Function Tests: Recent Labs    01/19/19 1250  TSH 1.874   Anemia Panel: No results for input(s): VITAMINB12, FOLATE, FERRITIN, TIBC, IRON, RETICCTPCT in the last 72 hours. Urine analysis:    Component Value Date/Time   COLORURINE YELLOW 12/29/2017 1933   APPEARANCEUR CLEAR 12/29/2017 1933   LABSPEC 1.020 12/29/2017 1933   PHURINE 5.0 12/29/2017 1933   GLUCOSEU NEGATIVE 12/29/2017 1933   HGBUR SMALL (A) 12/29/2017 1933   BILIRUBINUR negative 11/21/2018 1550   KETONESUR negative 11/21/2018 [place] 12/29/2017 1933   PROTEINUR negative 11/21/2018 1550   PROTEINUR >=300 (A) 12/29/2017 1933   UROBILINOGEN 0.2 11/21/2018 1550   UROBILINOGEN 0.2 11/01/2017 1128   NITRITE Negative 11/21/2018 1550   NITRITE NEGATIVE 12/29/2017 1933   LEUKOCYTESUR Negative 11/21/2018 1550   Sepsis Labs: @LABRCNTIP (procalcitonin:4,lacticidven:4)  ) Recent Results (from the past 240 hour(s))  MRSA PCR Screening     Status: None   Collection Time: 01/19/19  3:50 PM  Result Value Ref Range Status   MRSA by PCR NEGATIVE NEGATIVE Final    Comment:        The GeneXpert MRSA Assay (FDA approved for NASAL specimens only), is one component of a comprehensive MRSA colonization surveillance program. It is not intended to diagnose MRSA infection nor to guide or monitor treatment for MRSA infections. Performed at [facility], [place] [address], [place], [place] [address]       Radiology Studies: No results found.   Scheduled Meds: . allopurinol  100 mg Oral Daily  . amiodarone  200 mg Oral Daily  . apixaban  5 mg Oral BID   . atorvastatin  10 mg Oral Daily  . carvedilol  3.125 mg Oral BID WC  . Chlorhexidine Gluconate Cloth  6 each Topical Daily  . fluconazole  200 mg Oral Q24H  . fluticasone  2 spray Each Nare Daily  . gabapentin  300 mg Oral TID  . insulin aspart  0-20 Units Subcutaneous TID WC  . insulin aspart protamine- aspart  34 Units Subcutaneous BID WC  . insulin glargine  20 Units Subcutaneous Daily  . ketorolac  30 mg Intravenous Once  .  loratadine  10 mg Oral Daily  . meclizine  25 mg Oral TID   Continuous Infusions:    LOS: 3 days   Time Spent in minutes   30 minutes  Estalee Mccandlish D.O. on 01/22/2019 at 9:10 AM  Between 7am to 7pm - Please see pager noted on [email or web address]  After 7pm go to [email or web address]  And look for the night coverage person covering for me after hours  [facility] Office  [phone]

## 2019-01-22 NOTE — Plan of Care (Signed)

## 2019-01-22 NOTE — Progress Notes (Signed)
PT Cancellation Note  Patient Details Name: Jeffrey [name] MRN: [id number] DOB: 1976/10/04   Cancelled Treatment:    Reason Eval/Treat Not Completed: Other (comment); pt is amb in room independently without device despite gout pain in right  knee and left  Ankle;  Pt was amb ~ 450' prior to gout flare; pt feels he does not need PT services, he was independent and amb ~ 78mi per day prior to admission; PT will sign off, re-consult if needs should arise;    [facility] 01/22/2019, 11:11 AM

## 2019-01-22 NOTE — Progress Notes (Signed)
PHARMACY NOTE -  Anticoagulation  Pharmacy has been assisting with dosing of Eliquis for Afib. Dosage remains stable at 5mg  po BID and need for further dosage adjustment appears unlikely at present.  Hg stable.   Will sign off at this time.  Please reconsult if a change in clinical status warrants re-evaluation of dosage.  [name], PharmD, BCPS 01/22/2019@10 :32 AM

## 2019-01-23 ENCOUNTER — Other Ambulatory Visit: Payer: Self-pay | Admitting: Family Medicine

## 2019-01-23 DIAGNOSIS — Z794 Long term (current) use of insulin: Principal | ICD-10-CM

## 2019-01-23 DIAGNOSIS — N183 Chronic kidney disease, stage 3 unspecified: Secondary | ICD-10-CM

## 2019-01-23 DIAGNOSIS — E1122 Type 2 diabetes mellitus with diabetic chronic kidney disease: Secondary | ICD-10-CM

## 2019-01-23 LAB — GLUCOSE, CAPILLARY
Glucose-Capillary: 145 mg/dL — ABNORMAL HIGH (ref 70–99)
Glucose-Capillary: 162 mg/dL — ABNORMAL HIGH (ref 70–99)
Glucose-Capillary: 168 mg/dL — ABNORMAL HIGH (ref 70–99)
Glucose-Capillary: 282 mg/dL — ABNORMAL HIGH (ref 70–99)
Glucose-Capillary: 319 mg/dL — ABNORMAL HIGH (ref 70–99)

## 2019-01-23 LAB — BASIC METABOLIC PANEL
Anion gap: 6 (ref 5–15)
BUN: 15 mg/dL (ref 6–20)
CO2: 22 mmol/L (ref 22–32)
Calcium: 9.2 mg/dL (ref 8.9–10.3)
Chloride: 110 mmol/L (ref 98–111)
Creatinine, Ser: 1.22 mg/dL (ref 0.61–1.24)
GFR calc Af Amer: >60 mL/min (ref >60)
GFR calc non Af Amer: >60 mL/min (ref >60)
Glucose, Bld: 193 mg/dL — ABNORMAL HIGH (ref 70–99)
Potassium: 4.2 mmol/L (ref 3.5–5.1)
Sodium: 138 mmol/L (ref 135–145)

## 2019-01-23 LAB — HEMOGLOBIN A1C
Hgb A1c MFr Bld: >15.5 % — ABNORMAL HIGH (ref 4.8–5.6)
Hgb A1c MFr Bld: >15.5 % — ABNORMAL HIGH (ref 4.8–5.6)
Mean Plasma Glucose: >398 mg/dL
Mean Plasma Glucose: >398 mg/dL

## 2019-01-23 MED ORDER — INSULIN NPH ISOPHANE & REGULAR (70-30) 100 UNIT/ML ~~LOC~~ SUSP: 38.0000 [IU] | Freq: Two times a day (BID) | SUBCUTANEOUS | 3 refills | Status: DC

## 2019-01-23 MED ORDER — BLOOD GLUCOSE METER KIT: PACK | 0 refills | Status: DC

## 2019-01-23 MED ORDER — INSULIN PEN NEEDLE 32G X 6 MM MISC: 1 refills | Status: DC

## 2019-01-23 MED ORDER — INSULIN ASPART 100 UNIT/ML FLEXPEN: 0.0000 [IU] | PEN_INJECTOR | Freq: Three times a day (TID) | SUBCUTANEOUS | 1 refills | Status: DC

## 2019-01-23 MED ORDER — FLUCONAZOLE 200 MG PO TABS: 200.0000 mg | ORAL_TABLET | ORAL | 0 refills | Status: DC

## 2019-01-23 MED FILL — TRUEPLUS PEN NDL 31G X 1/4: 31G X 6 MM | 20 days supply | Qty: 100 | Fill #0

## 2019-01-23 MED FILL — FLUCONAZOLE 200 MG TAB: 200 | 10 days supply | Qty: 10 | Fill #0

## 2019-01-23 MED FILL — TRUEPLUS PEN NDL 31G X 1/4": 31G X 6 MM | 20 days supply | Qty: 100 | Fill #0

## 2019-01-23 MED FILL — HUMULIN 70/30 VIAL: (70-30) 100 | 26 days supply | Qty: 20 | Fill #0

## 2019-01-23 MED FILL — NOVOLOG FLEXPEN SYRINGE: 100 | 30 days supply | Qty: 15 | Fill #0

## 2019-01-23 NOTE — Discharge Instructions (Signed)
Hyperglycemic Hyperosmolar State Hyperglycemic hyperosmolar state is a serious condition in which you experience an extreme increase in your blood sugar (glucose) level. This makes your body become extremely dehydrated, which can be life-threatening. This condition is a result of uncontrolled or undiagnosed diabetes. It occurs most often in people who have type 2 diabetes (type 2 diabetes mellitus). Certain hormones (insulin and glucagon) control the level of glucose that is in the blood. Insulin lowers blood glucose, and glucagon increases blood glucose. Hyperglycemia can result from having too little insulin in the bloodstream, or from the body not responding normally to insulin. Normally, the body gets rid of excess glucose through urine. If you do not drink enough fluids, or if you drink fluids that contain sugar, your body cannot get rid of excess glucose. This can result in hyperglycemic hyperosmolar state. What are the causes? This condition may be caused by:  Infection.  Medicines that cause you to become dehydrated or cause you to lose fluid.  Certain illnesses.  Not taking your diabetes medicine.  New onset or diagnosis of diabetes.  Cardiovascular disease (CVD). What increases the risk? The following factors make you more likely to develop this condition:  Older age.  Poor management of diabetes.  Inability to eat or drink normally.  Heart failure.  Infection.  Surgery.  Illness. What are the signs or symptoms? Symptoms of this condition include:  Extreme or increased thirst. This symptom may gradually disappear.  Needing to urinate more often than usual.  Dry mouth.  Warm, dry skin that does not sweat even in high temperatures.  High fever.  Sleepiness or confusion.  Vision problems or vision loss.  Seeing, hearing, tasting, smelling, or feeling things that are not real (hallucinations).  Weakness.  Weight loss.  Vomiting. How is this  diagnosed? Hyperglycemic hyperosmolar state is diagnosed based on your medical history, your symptoms, and a blood test to measure your blood glucose level. How is this treated? This condition is treated in the hospital. The goals of treatment are:  To correct dehydration by replacing fluids that you have lost. Fluids will be given through an IV tube.  To improve blood sugar levels using insulin or other medicines as needed.  To treat the cause of hyperglycemia, such as an infection, illness, or newly diagnosed diabetes. Follow these instructions at home: General instructions  Take over-the-counter and prescription medicines only as told by your health care provider.  Do not use any products that contain nicotine or tobacco, such as cigarettes and e-cigarettes. If you need help quitting, ask your health care provider.  Limit alcohol intake to no more than 1 drink a day for nonpregnant women and 2 drinks a day for men. One drink equals 12 oz of beer, 5 oz of wine, or 1 oz of hard liquor.  Stay hydrated, especially when you exercise, when you get sick, or when you spend time in hot temperatures.  Learn to manage stress. If you need help with this, ask your health care provider.  Keep all follow-up visits as told by your health care provider. This is important. Eating and drinking   Maintain a healthy weight.  Exercise regularly, as directed by your health care provider.  Eat healthy foods, such as: ? Lean proteins. ? Complex carbohydrates. ? Fresh fruits and vegetables. ? Low-fat dairy products. ? Healthy fats.  Drink enough fluid to keep your urine clear or pale yellow. If You Have Diabetes:   Make sure you know the early signs  and symptoms of hyperglycemia.  Follow your diabetes management plan, as told by your health care provider. Make sure you: ? Take your insulin and medicines as directed. ? Follow your exercise plan. ? Follow your meal plan. Eat on time, and do  not skip meals. ? Check your blood glucose as often as directed. Make sure to check your blood glucose before and after exercise. If you exercise longer or in a different way than usual, check your blood glucose more often. ? Follow your sick day plan whenever you cannot eat or drink normally. Make this plan in advance with your health care provider.  Share your diabetes management plan with people in your workplace, school, and household.  Check your urine for ketones when you are ill and as often as told by your health care provider.  Carry a medical alert card or wear medical alert jewelry. Contact a health care provider if:  You cannot eat or drink without throwing up.  You develop a fever. Get help right away if:  You develop symptoms of hyperglycemic hyperosmolar state. These symptoms may represent a serious problem that is an emergency. Do not wait to see if the symptoms will go away. Get medical help right away. Call your local emergency services (911 in the [place] Do not drive yourself to the hospital. Summary  Hyperglycemic hyperosmolar state is a serious condition in which you experience an extreme increase in your blood sugar (glucose) level. This makes your body become extremely dehydrated, which can be life-threatening.  This condition is a result of uncontrolled or undiagnosed diabetes. It occurs most often in people who have type 2 diabetes (type 2 diabetes mellitus).  This condition is treated in the hospital. Treatment may include fluids given through an IV tube and other medicines.  Make sure you know the early signs and symptoms of hyperglycemia.  Follow your diabetes management plan, as told by your health care provider. This information is not intended to replace advice given to you by your health care provider. Make sure you discuss any questions you have with your health care provider. Document Released: 07/24/2004 Document Revised: 09/14/2016 Document Reviewed:  09/14/2016 Elsevier Interactive Patient Education  2019 [employer].

## 2019-01-23 NOTE — Discharge Summary (Signed)
Physician Discharge Summary  Jeffrey [name] QQV:[id number] DOB: [birth year] DOA: 01/19/2019  PCP: [name], FNP  Admit date: 01/19/2019 Discharge date: 01/23/2019  Time spent: 45 minutes  Recommendations for Outpatient Follow-up:  Patient will be discharged to home.  Patient will need to follow up with primary care provider within one week of discharge.  Patient should continue medications as prescribed.  Patient should follow a heart healthy/carb modified diet.   Discharge Diagnoses:  Principal Problem: Hyperosmolar nonketotic state/uncontrolled diabetes mellitus, type II Constipation Bright red blood per rectum Hypotension Essential hypertension Chronic systolic congestive heart failure Acute kidney injury on chronic kidney disease, stage III Diabetic neuropathy Hyperkalemia, now hypokalemia Paroxysmal atrial fibrillation History of ventricular tachycardia Morbid obesity/obstructive sleep apnea Renal cell carcinoma Gouty arthritis Oral thrush  Discharge Condition: Stable  Diet recommendation: heart healthy/carb modified  Filed Weights   01/19/19 1225  Weight: 129.7 kg    History of present illness:  On 01/20/2019 by Dr. Glenard Haring a 43 y.o.malewith medical history ofrenal cell cancer s/p right radical nephrectomy, chronic systolic CGF with EF of 20 %, V Tach s/p AICD, HTN, A-fib on Eliquis, Morbid obesity, OSA on CPAP, DM2who presents for the above complaints. He has not checked his sugars in 2 months. In the ED his sugars is 1412. He has had a sore throat for about 3 wks. He was treated with a course of Amoxicillin by his PCP for strep throat (per Epic, it was Augmentin for 7 days). The rapid strep screen was negative but the culture was positive. He has continued to have a severely sore throat despite treatment.  He has severe thirst and has been drinking and urinating non-stop. He has been checking the time and is urinating every hour.  He has  had a runny nose and is being treated for seasonal allergies. He has an occasional feeling of fullness in his ears. No cough, dyspnea or fever noted.  He has not had a BM in 2 wks but also states he has not had solid food. Per his PCP note in March, he has been juicing.   He has been taking his insulin and all of his other medications.  Hospital Course:  Hyperosmolar nonketotic state/uncontrolled diabetes mellitus, type II -Patient presented with a glucose of 1412, anion gap was unremarkable -She admits to having a broken glucometer for the past several months and was not able to check his blood sugars.  He does state that he takes insulin injections as well as pills but does not know what those pills are.  He also has been juicing and trying to lose weight.  Of note, patient was recently given antibiotics for suspected strep throat. -He was placed on glucose stabilizer.  Blood sugars have now improved, and patient has been transitioned to insulin sliding scale -Last hemoglobin A1c was 11.7 on 11/28/2018.  Pending repeat hemoglobin A1c- will reorder today (appears lab was ordered on admission, but still pending) -Had long discussion with patient regarding the need for closer blood sugar monitoring Nutrition consulted -transitioned to novolog 70/30 (increased to 38u BID) with ISS -will discharge patient with sliding scale as well -instructed patient to check his sugars with meals and administer insulin per scale and keep a log -follow up with PCP in one week  Constipation -Patient states he has not had a bowel movement in 2 weeks prior to admission -Abdominal x-ray obtained showing stool -resolving   Bright red blood per rectum -Hemoglobin currently 12.4  (  was 14 on admission) -Suspect drop in hemoglobin due to IV fluids -Bright red blood per rectum possibly due to fissure and straining.  Patient is on had a bowel movement in 2 weeks -FOBT positive  -hemoglobin has remained stable   Hypotension -Resolved- Overnight on 01/20/2019, patient had systolic blood pressures ranging from 60-80, he was given several fluid boluses.  He is received approximately 5 L of fluid since admission -Blood pressure currently stable -Echocardiogram was ordered for hypotension, however cardiology has canceled this order.  Essential hypertension -Continue coreg -currently not controlled- likely due to pain  Chronic systolic congestive heart failure -Echocardiogram 12/31/2017 shows an EF of 25 to 30%.  Diffuse hypokinesis -BNP on admission 25.4 -Patient does not appear to be volume overloaded.  Currently compensated and euvolemic -Monitor intake and output, daily weights  Acute kidney injury on chronic kidney disease, stage III -resolved -Creatinine on admission was 2.94; Baseline creatinine approximately 1.5 -Suspect secondary to prerenal causes and hypovolemia -Creatinine now down to 1.22  Diabetic neuropathy -Complains of numbness and tingling in his feet. -Continue gabapentin  Hyperkalemia, now hypokalemia -Suspect secondary to the above.   -resolved  Paroxysmal atrial fibrillation -Continue amiodarone and Eliquis -Coreg  Initially held due to hypotension -Patient currently in normal sinus rhythm  History of ventricular tachycardia -Patient with AICD in place  Morbid obesity/obstructive sleep apnea -BMI 42.23 -Patient to follow-up with PCP to discuss lifestyle modifications including exercise and weight management -Continue CPAP  Renal cell carcinoma -Status post right nephrectomy  Gouty arthritis -Complaining of knee pain today -Continue allopurinol -Continue colchicine -PT consulted, recommended no further therapy. Rolling walker- wide  Oral thrush -Continue diflucan  Procedures: None  Consultations: None  Discharge Exam: Vitals:   01/22/19 2354 01/23/19 0413  BP: (!) 136/93 (!) 133/98  Pulse: 64 (!) 59  Resp: 20 19  Temp: 98 F (36.7 C)  97.8 F (36.6 C)  SpO2: 99% 99%     General: Well developed, well nourished, NAD, appears stated age  HEENT: NCAT, mucous membranes moist.  Neck: Supple  Cardiovascular: S1 S2 auscultated, RRR  Respiratory: Clear to auscultation bilaterally  Abdomen: Soft, nontender, nondistended, + bowel sounds  Extremities: warm dry without cyanosis clubbing or edema. Left ankle TTP  Neuro: AAOx3, nonfocal  Psych: Normal affect and demeanor   Discharge Instructions Discharge Instructions    Discharge instructions   Complete by:  As directed    Patient will be discharged to home.  Patient will need to follow up with primary care provider within one week of discharge.  Patient should continue medications as prescribed.  Patient should follow a heart healthy/carb modified diet.   Check your blood sugars at least 3 times a day and keep a log.     Allergies as of 01/23/2019      Reactions   Aspirin Shortness Of Breath, Itching, Rash    Burning sensation (Patient reports he tolerates other NSAIDS)    Bee Venom Hives, Swelling   Lisinopril Cough   Tomato Rash      Medication List    TAKE these medications   allopurinol 100 MG tablet Commonly known as:  ZYLOPRIM Take 1 tablet (100 mg total) by mouth daily.   amiodarone 200 MG tablet Commonly known as:  PACERONE Take 200 mg by mouth daily.   apixaban 5 MG Tabs tablet Commonly known as:  ELIQUIS Take 1 tablet (5 mg total) by mouth 2 (two) times daily.   aspirin EC 81 MG tablet  Take 81 mg by mouth daily.   atorvastatin 10 MG tablet Commonly known as:  LIPITOR Take 1 tablet (10 mg total) by mouth daily.   blood glucose meter kit and supplies Dispense based on patient and insurance preference. Use up to four times daily as directed. (FOR ICD-10 E10.9, E11.9).   carvedilol 3.125 MG tablet Commonly known as:  COREG Take 1 tablet (3.125 mg total) by mouth 2 (two) times daily.   cetirizine 10 MG tablet Commonly known as:   ZYRTEC Take 1 tablet (10 mg total) by mouth daily.   fluconazole 200 MG tablet Commonly known as:  DIFLUCAN Take 1 tablet (200 mg total) by mouth daily.   fluticasone 50 MCG/ACT nasal spray Commonly known as:  FLONASE Place 2 sprays into both nostrils daily.   gabapentin 300 MG capsule Commonly known as:  NEURONTIN TAKE 1 CAPSULE BY MOUTH 3 TIMES DAILY.   insulin aspart 100 UNIT/ML FlexPen Commonly known as:  NovoLOG FlexPen Inject 0-15 Units into the skin 3 (three) times daily with meals. CBG 121-150: 2u; CBG 151-200: 3u; CBG 201-250: 5u; CBG 251-300: 8u; CBG 301-350: 11u; CBG 351-400:15 u; CBG>400: 15u   insulin NPH-regular Human (70-30) 100 UNIT/ML injection Inject 38 Units into the skin 2 (two) times daily with a meal. What changed:  how much to take   Insulin Pen Needle 32G X 6 MM Misc Use with insulin sliding scale pen   isosorbide-hydrALAZINE 20-37.5 MG tablet Commonly known as:  BiDil Take 1 tablet by mouth 3 (three) times daily.   Magnesium Oxide 200 MG Tabs Take 100 mg by mouth daily.   meclizine 25 MG tablet Commonly known as:  ANTIVERT Take 1 tablet (25 mg total) by mouth 3 (three) times daily.   Mitigare 0.6 MG Caps Generic drug:  Colchicine Take 0.6 mg by mouth as needed (Gout).   ondansetron 4 MG tablet Commonly known as:  Zofran Take 1 tablet (4 mg total) by mouth every 8 (eight) hours as needed for nausea or vomiting.   sacubitril-valsartan 97-103 MG Commonly known as:  ENTRESTO Take 1 tablet by mouth 2 (two) times daily.   torsemide 20 MG tablet Commonly known as:  DEMADEX Take 2 tablets (40 mg total) by mouth daily.   Vitamin D (Ergocalciferol) 1.25 MG (50000 UT) Caps capsule Commonly known as:  DRISDOL Take 50,000 Units by mouth every 30 (thirty) days.            Durable Medical Equipment  (From admission, onward)         Start     Ordered   01/23/19 0929  For home use only DME Walker wide  [facility])  Once    Question:  Patient  needs a walker to treat with the following condition  Answer:  Physical deconditioning   01/23/19 0929         Allergies  Allergen Reactions  . Aspirin Shortness Of Breath, Itching and Rash     Burning sensation (Patient reports he tolerates other NSAIDS)   . Bee Venom Hives and Swelling  . Lisinopril Cough  . Tomato Rash   Follow-up Information    [name], FNP. Schedule an appointment as soon as possible for a visit in 1 week(s).   Specialty:  Family Medicine Why:  Hospital follow up Contact information: [place] [address] [phone]        Bensimhon, Shaune Pascal, MD .   Specialty:  Cardiology Contact information: [address]  [place] [address] [phone]            The results of significant diagnostics from this hospitalization (including imaging, microbiology, ancillary and laboratory) are listed below for reference.    Significant Diagnostic Studies: Dg Chest Portable 1 View  Result Date: 01/19/2019 CLINICAL DATA:  Shortness of breath. EXAM: PORTABLE CHEST 1 VIEW COMPARISON:  Radiographs of August 04, 2018. FINDINGS: The heart size and mediastinal contours are within normal limits. Both lungs are clear. No pneumothorax or pleural effusion is noted. Stable position of defibrillator. The visualized skeletal structures are unremarkable. IMPRESSION: No active disease. Electronically Signed   By: Marijo Conception M.D.   On: 01/19/2019 12:47   Dg Abd Portable 1v  Result Date: 01/19/2019 CLINICAL DATA:  Constipation EXAM: PORTABLE ABDOMEN - 1 VIEW COMPARISON:  CT abdomen pelvis 08/22/2017 FINDINGS: Nonobstructive bowel gas pattern. Mild amount of stool in the colon. No abnormal calcifications. No acute skeletal abnormality. IMPRESSION: Mild stool in the colon. Electronically Signed   By: Franchot Gallo M.D.   On: 01/19/2019 16:02   Dg Foot Complete Left  Result Date: 12/30/2018 CLINICAL DATA:  Left great toe pain  since the patient dropped a bottle of Lysol on the toe 1 week ago. Initial encounter. EXAM: LEFT FOOT - COMPLETE 3+ VIEW COMPARISON:  None. FINDINGS: There is no acute bony or joint abnormality. Degenerative change about the hindfoot and midfoot noted. Mild spurring at the Achilles tendon insertion is identified. Soft tissues appear normal. IMPRESSION: No acute abnormality. Midfoot and hindfoot osteoarthritis. Electronically Signed   By: Inge Rise M.D.   On: 12/30/2018 15:08    Microbiology: Recent Results (from the past 240 hour(s))  MRSA PCR Screening     Status: None   Collection Time: 01/19/19  3:50 PM  Result Value Ref Range Status   MRSA by PCR NEGATIVE NEGATIVE Final    Comment:        The GeneXpert MRSA Assay (FDA approved for NASAL specimens only), is one component of a comprehensive MRSA colonization surveillance program. It is not intended to diagnose MRSA infection nor to guide or monitor treatment for MRSA infections. Performed at [facility], [place] [address], [place] [address]      Labs: Basic Metabolic Panel: Recent Labs  Lab 01/20/19 0645 01/20/19 1031 01/21/19 0450 01/22/19 0505 01/23/19 0812  NA 139 134* 138 138 138  K 3.2* 3.3* 3.2* 3.5 4.2  CL 109 106 110 110 110  CO2 20* 18* 19* 22 22  GLUCOSE 233* 478* 196* 232* 193*  BUN 32* 29* _0 CREATININE 1.89* 1.88* 1.42* 1.17 1.22  CALCIUM 7.6* 7.4* 8.4* 8.7* 9.2   Liver Function Tests: Recent Labs  Lab 01/19/19 1250  AST 18  ALT 22  ALKPHOS 109  BILITOT 0.8  PROT 7.6  ALBUMIN 3.7   No results for input(s): LIPASE, AMYLASE in the last 168 hours. No results for input(s): AMMONIA in the last 168 hours. CBC: Recent Labs  Lab 01/19/19 1250 01/20/19 0645 01/21/19 0450 01/22/19 0505  WBC 8.6 9.8 8.8  --   HGB 14.0 12.3* 13.6 12.4*  HCT 45.8 38.6* 43.9 39.8  MCV 81.3 79.4* 79.4*  --   PLT 244 218 226  --    Cardiac Enzymes: Recent Labs  Lab 01/19/19  1250  TROPONINI 0.03*   BNP: BNP (last 3 results) Recent Labs    02/10/18 1137 05/26/18 1221 01/19/19 1250  BNP 706.8* 93.3 25.4    ProBNP (last 3 results) No results for input(s): PROBNP in the last 8760 hours.  CBG: Recent Labs  Lab 01/22/19 1813 01/22/19 2022 01/23/19 0005 01/23/19 0414 01/23/19 0751  GLUCAP 253* 255* 168* 145* 162*       Signed:  Gertha Lichtenberg  [facility] 01/23/2019, 9:30 AM

## 2019-01-24 MED FILL — ACCU-CHEK FASTCLIX LANCETS: 25 days supply | Qty: 102 | Fill #0

## 2019-01-24 MED FILL — ACCU-CHEK GUIDE TEST STRIP: 25 days supply | Qty: 100 | Fill #0

## 2019-01-26 MED FILL — ACCU-CHEK GUIDE W/DEVICE KI: W/DEVICE | 1 days supply | Qty: 1 | Fill #0

## 2019-01-27 ENCOUNTER — Encounter (HOSPITAL_COMMUNITY): Payer: Self-pay

## 2019-01-27 ENCOUNTER — Ambulatory Visit (HOSPITAL_COMMUNITY)
Admission: RE | Admit: 2019-01-27 | Discharge: 2019-01-27 | Disposition: A | Discharge status: Home / Self Care | Payer: Medicaid Other | Source: Ambulatory Visit | Attending: Internal Medicine | Admitting: Internal Medicine

## 2019-01-27 ENCOUNTER — Other Ambulatory Visit: Payer: Self-pay

## 2019-01-27 DIAGNOSIS — E1122 Type 2 diabetes mellitus with diabetic chronic kidney disease: Secondary | ICD-10-CM | POA: Insufficient documentation

## 2019-01-27 DIAGNOSIS — C649 Malignant neoplasm of unspecified kidney, except renal pelvis: Secondary | ICD-10-CM | POA: Insufficient documentation

## 2019-01-27 DIAGNOSIS — I130 Hypertensive heart and chronic kidney disease with heart failure and stage 1 through stage 4 chronic kidney disease, or unspecified chronic kidney disease: Secondary | ICD-10-CM | POA: Diagnosis not present

## 2019-01-27 DIAGNOSIS — Z7982 Long term (current) use of aspirin: Secondary | ICD-10-CM | POA: Diagnosis not present

## 2019-01-27 DIAGNOSIS — E7800 Pure hypercholesterolemia, unspecified: Secondary | ICD-10-CM | POA: Diagnosis not present

## 2019-01-27 DIAGNOSIS — G4733 Obstructive sleep apnea (adult) (pediatric): Secondary | ICD-10-CM | POA: Insufficient documentation

## 2019-01-27 DIAGNOSIS — Z7901 Long term (current) use of anticoagulants: Secondary | ICD-10-CM | POA: Diagnosis not present

## 2019-01-27 DIAGNOSIS — N183 Chronic kidney disease, stage 3 (moderate): Secondary | ICD-10-CM | POA: Diagnosis not present

## 2019-01-27 DIAGNOSIS — Z79899 Other long term (current) drug therapy: Secondary | ICD-10-CM | POA: Diagnosis not present

## 2019-01-27 DIAGNOSIS — Z8249 Family history of ischemic heart disease and other diseases of the circulatory system: Secondary | ICD-10-CM | POA: Diagnosis not present

## 2019-01-27 DIAGNOSIS — Z95810 Presence of automatic (implantable) cardiac defibrillator: Secondary | ICD-10-CM | POA: Insufficient documentation

## 2019-01-27 DIAGNOSIS — I10 Essential (primary) hypertension: Secondary | ICD-10-CM

## 2019-01-27 DIAGNOSIS — I480 Paroxysmal atrial fibrillation: Secondary | ICD-10-CM | POA: Insufficient documentation

## 2019-01-27 DIAGNOSIS — M109 Gout, unspecified: Secondary | ICD-10-CM | POA: Diagnosis not present

## 2019-01-27 DIAGNOSIS — Z7951 Long term (current) use of inhaled steroids: Secondary | ICD-10-CM | POA: Insufficient documentation

## 2019-01-27 DIAGNOSIS — Z794 Long term (current) use of insulin: Secondary | ICD-10-CM | POA: Insufficient documentation

## 2019-01-27 DIAGNOSIS — I5022 Chronic systolic (congestive) heart failure: Secondary | ICD-10-CM

## 2019-01-27 DIAGNOSIS — Z833 Family history of diabetes mellitus: Secondary | ICD-10-CM | POA: Diagnosis not present

## 2019-01-27 DIAGNOSIS — I428 Other cardiomyopathies: Secondary | ICD-10-CM | POA: Diagnosis not present

## 2019-01-27 DIAGNOSIS — E1165 Type 2 diabetes mellitus with hyperglycemia: Secondary | ICD-10-CM | POA: Diagnosis not present

## 2019-01-27 DIAGNOSIS — Z87891 Personal history of nicotine dependence: Secondary | ICD-10-CM | POA: Diagnosis not present

## 2019-01-27 MED ORDER — SPIRONOLACTONE 25 MG PO TABS: 12.5000 mg | ORAL_TABLET | Freq: Every day | ORAL | 3 refills | Status: DC

## 2019-01-27 MED FILL — SPIRONOLACTONE 25 MG TABLET: 25 | 60 days supply | Qty: 30 | Fill #0

## 2019-01-27 NOTE — Patient Instructions (Addendum)
Lab work will need to be done in 2 weeks. This is scheduled for May 11th at 10:45.  RESTART Spironolactone 12.5 mg (0.5 tab) daily  Please follow up with the Advanced Practice Provider in 2 months. This is scheduled for June 24th at 2:30pm

## 2019-01-27 NOTE — Progress Notes (Signed)
Spoke with pt to review after visit summary instructions. Pt is agreeable to med changes and follow up appointments. Denies needs for refills at this time. No further questions.

## 2019-01-27 NOTE — Addendum Note (Signed)
Encounter addended by: Marlise Eves, RN on: 01/27/2019 3:12 PM  Actions taken: Order list changed, Diagnosis association updated, Clinical Note Signed

## 2019-01-27 NOTE — Progress Notes (Signed)
Heart Failure TeleHealth Note  Due to national recommendations of social distancing due to [place] 19, Audio/video telehealth visit is felt to be most appropriate for this patient at this time.  See MyChart message from today for patient consent regarding telehealth for [facility].  Date:  01/27/2019   ID:  [name], DOB December 07, 1975, MRN [id number]  Location: Home  Provider location: [facility] Type of Visit: Established patient  PCP:  [name], FNP  Cardiologist:  Glori Bickers, MD Primary HF: Shivaun Bilello  Chief Complaint: Heart Failure follow-up   History of Present Illness: ZADOK HOLAWAY is a 43 y.o. 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    Diagnosed with HF in 2012. _0  [facility]16 secondary to atypical chest pain, dyspnea, and mild troponin elevation. Echo during hospitalization showe "severely reduced LV function" - EF not quantified due to poor windows.   Admitted 05/14/15 with CP at rest. [facility] showed normal coronaries and moderately elevated filling pressures with moderately depressed cardiac output. [place] subcutaneous ICD was implanted 05/17/15.    Admitted 9/4 - 11/12/34 with A/C systolic CHF in the setting of new afib. Started on IV lasix and amiodarone. Pt diuresed and converted to NSR without requiring DCCV. Started on Eliquis for [facility]. Continued on po amio on discharge with wean over several weeks.  Admitted 3/27 - 01/01/18 with ICD shock. Echo 12/31/17 with LVEF 25-30%.   Admitted 4/16-4/20/20 with blood sugar 1412 with polydipsia and polyuria. Had AKI with creatinine 2.9 Which resolved.  Required insulin drip. Insulin for DC adjusted. Told to follow up with PCP. No changes to HF meds.  Last seen in HF clinic 10/11/18. He was doing well. Arlyce Harman was restarted and he was  set up for repeat echo. Weight was 319 lbs that day.  He presents today via [employer], [employer] (consulting) for a telehealth visit today in the setting of the [place] pandemic. Says he is feeling better. Vision still a bit blurry but checking sugars and running 150. Still with LE neuropathy. Polyuria has slowed down. Weight is up and down but denies SOB, edema, orthopnea or PND. SBP 130s.   Echo 12/31/17 LVEF 25-30%, Moderate MR, mild RV dilation, Severe RAE, Mod TR, Pa peak pressure 67 mm Hg.  ECHO 01/16/2016 EF 20-25%  Grade III DD Echo 05/07/15.  EF 20-25%, grade II diastolic dysfunction, diffuse hypokinesis, moderate LV dilation, mild LVH, normal RV size with mildly decreased systolic function.   Echo in 2012 with EF 20-25%.  In 1/13 had nuclear study EF 45-50% suggestive of NICM.   [facility] 05/17/15 RA = 7 RV = 42/3/7 PA = 43/30 (39) PCW = 26 Fick cardiac output/index = 4.9/2.1 PVR = 2.7 WU SVR = 1651 FA sat = 95% PA sat = 65%, 65% Ao = 122/97 (109) LV = 110/31/33 1) Normal coronaries 2) Moderately elevated filling pressures with moderately depressed cardiac output  Stran L Hechavarria denies symptoms worrisome for COVID 19.   Past Medical History:  Diagnosis Date  . Anxiety   . Aspirin allergy   . Childhood asthma   . Chronic systolic CHF (congestive heart failure) ([place])    a. EF 20-25% in 2012. b. EF 45-50% in 10/2011 with nonischemic nuc - presumed NICM. c. 12/2014 Echo: Sev depressed LV fxn, sev dil LV, mild  LVH, mild MR, sev dil LA, mildly reduced RV fxn.  . CKD (chronic kidney disease) stage 2, GFR 60-89 ml/min   . High cholesterol   . Hypertension   . Morbid obesity ([place])   . Nephrolithiasis   . OSA on CPAP   . Presumed NICM    a. 04/2014 Myoview: EF 26%, glob HK, sev glob HK, ? prior infarct;  b. Never cathed 2/2 CKD.  [name] Renal cell carcinoma ([place])    a. s/p Rt robotic assisted partial converted to radical nephrectomy on 01/2013.  . Troponin level elevated    a. 04/2014, 12/2014: felt due  to CHF.  . Type II diabetes mellitus ([place])   . Ventricular tachycardia ([place])    a. appropriate ICD therapy 12/2017   Past Surgical History:  Procedure Laterality Date  . APPENDECTOMY  07/2004  . CARDIAC CATHETERIZATION N/A 05/17/2015   Procedure: Right/Left Heart Cath and Coronary Angiography;  Surgeon: Jolaine Artist, MD;  Location: [facility];  Service: Cardiovascular;  Laterality: N/A;  . EP IMPLANTABLE DEVICE N/A 05/17/2015   Procedure: SubQ ICD Implant;  Surgeon: Will Meredith Leeds, MD;  Location: [facility];  Service: Cardiovascular;  Laterality: N/A;  . ROBOTIC ASSISTED LAPAROSCOPIC LYSIS OF ADHESION  01/13/2013   Procedure: ROBOTIC ASSISTED LAPAROSCOPIC LYSIS OF ADHESION EXTENSIVE;  Surgeon: Alexis Frock, MD;  Location: [facility];  Service: Urology;;  . ROBOTIC ASSITED PARTIAL NEPHRECTOMY Right 01/13/2013   Procedure: ROBOTIC ASSITED PARTIAL NEPHRECTOMY CONVERTED TO ROBOTIC ASSISTED RIGHT RADICAL NEPHRECTOMY;  Surgeon: Alexis Frock, MD;  Location: [facility];  Service: Urology;  Laterality: Right;     Current Outpatient Medications  Medication Sig Dispense Refill  . allopurinol (ZYLOPRIM) 100 MG tablet Take 1 tablet (100 mg total) by mouth daily. 90 tablet 3  . amiodarone (PACERONE) 200 MG tablet Take 200 mg by mouth daily.    [name] apixaban (ELIQUIS) 5 MG TABS tablet Take 1 tablet (5 mg total) by mouth 2 (two) times daily. 60 tablet 10  . aspirin EC 81 MG tablet Take 81 mg by mouth daily.    [name] atorvastatin (LIPITOR) 10 MG tablet Take 1 tablet (10 mg total) by mouth daily. 30 tablet 3  . blood glucose meter kit and supplies Dispense based on patient and insurance preference. Use up to four times daily as directed. (FOR ICD-10 E10.9, E11.9). 1 each 0  . carvedilol (COREG) 3.125 MG tablet Take 1 tablet (3.125 mg total) by mouth 2 (two) times daily. 60 tablet 2  . cetirizine (ZYRTEC) 10 MG tablet Take 1 tablet (10 mg total) by mouth daily. 30 tablet 6  . fluconazole  (DIFLUCAN) 200 MG tablet Take 1 tablet (200 mg total) by mouth daily. 10 tablet 0  . fluticasone (FLONASE) 50 MCG/ACT nasal spray Place 2 sprays into both nostrils daily. 16 g 6  . gabapentin (NEURONTIN) 300 MG capsule TAKE 1 CAPSULE BY MOUTH 3 TIMES DAILY. 90 capsule 3  . insulin aspart (NOVOLOG FLEXPEN) 100 UNIT/ML FlexPen Inject 0-15 Units into the skin 3 (three) times daily with meals. CBG 121-150: 2u; CBG 151-200: 3u; CBG 201-250: 5u; CBG 251-300: 8u; CBG 301-350: 11u; CBG 351-400:15 u; CBG>400: 15u 15 mL 1  . insulin NPH-regular Human (70-30) 100 UNIT/ML injection Inject 38 Units into the skin 2 (two) times daily with a meal. 60 mL 3  . Insulin Pen Needle 32G X 6 MM MISC Use with insulin sliding scale pen 100 each 1  . isosorbide-hydrALAZINE (BIDIL)  20-37.5 MG tablet Take 1 tablet by mouth 3 (three) times daily. 90 tablet 5  . Magnesium Oxide 200 MG TABS Take 100 mg by mouth daily.    . meclizine (ANTIVERT) 25 MG tablet Take 1 tablet (25 mg total) by mouth 3 (three) times daily. 30 tablet 4  . MITIGARE 0.6 MG CAPS Take 0.6 mg by mouth as needed (Gout).   3  . ondansetron (ZOFRAN) 4 MG tablet Take 1 tablet (4 mg total) by mouth every 8 (eight) hours as needed for nausea or vomiting. 20 tablet 1  . sacubitril-valsartan (ENTRESTO) 97-103 MG Take 1 tablet by mouth 2 (two) times daily. 180 tablet 3  . torsemide (DEMADEX) 20 MG tablet Take 2 tablets (40 mg total) by mouth daily. 60 tablet 3  . Vitamin D, Ergocalciferol, (DRISDOL) 1.25 MG (50000 UT) CAPS capsule Take 50,000 Units by mouth every 30 (thirty) days.  5   No current facility-administered medications for this encounter.     Allergies:   Aspirin; Bee venom; Lisinopril; and Tomato   Social History:  The patient  reports that he quit smoking about 3 years ago. He has a 5.50 pack-year smoking history. He has never used smokeless tobacco. He reports current drug use. Frequency: 14.00 times per week. Drug: Marijuana. He reports that he does  not drink alcohol.   Family History:  The patient's family history includes Diabetes in his maternal aunt; Hypertension in his mother.   ROS:  Please see the history of present illness.   All other systems are personally reviewed and negative.   Exam:  (Video/Tele Health Call; Exam is subjective and or/visual.) General:  Speaks in full sentences. No resp difficulty. Lungs: Normal respiratory effort with conversation.  Abdomen: Non-distended per patient report Extremities: Pt denies edema. Neuro: Alert & oriented x 3.   Recent Labs: 01/19/2019: ALT 22; B Natriuretic Peptide 25.4; TSH 1.874 01/21/2019: Platelets 226 01/22/2019: Hemoglobin 12.4 01/23/2019: BUN 15; Creatinine, Ser 1.22; Potassium 4.2; Sodium 138  Personally reviewed   Wt Readings from Last 3 Encounters:  01/19/19 129.7 kg (285 lb 15 oz)  12/30/18 129.7 kg (286 lb)  12/07/18 (!) 140.2 kg (309 lb)      ASSESSMENT AND PLAN:  1.  Chronic systolic congestive heart failure: s/p [employer] subcutaneous ICD implant 05/17/15.  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  - Echo 12/31/17 LVEF 25-30%, Moderate MR, mild RV dilation, Severe RAE, Mod TR, Pa peak pressure 67 mm Hg.  - NYHA I-II - Volume status well controlled - Continue torsemide 40 mg daily  - Continue Entresto 97/103 mg BID. Watch closely with solitary kidney.  - Continue bidil 1 tab TID - Continue carvedilol 3.125 bid - Restart spiro 12.5 daily - Reinforced fluid restriction to < 2 L daily, sodium restriction to less than 2000 mg daily, and the importance of daily weights.   2. Paroxysmal Afib - Continue Eliquis 5 mg BID. Denies bleeding. - Continue amiodarone 200 daily. LFTs and TSH stable 01/2019 - Will need yearly eye exams.  3. VT with ICD shock 12/29/17 - Continue amiodarone 200 mg daily. No syncope or palpitations. 4. CKD stage III - Stable with creatinine 1.22 on 4/20 5. OSA:  - Continue CPAP.  6. DM2:  - Uncontrolled. Recent admission for sugar  >1400. - Followed by PCP 7. Renal Cell Carcinoma - S/P nephrectomy 2014.  - Follow closely in setting of solitary kidney.  - Follows at [facility]. Unsure of his doctor's name.  - Stable  with creatinine 1.22 on 4/20 8. Morbid Obesity - Needs to lose weight.  COVID screen The patient does not have any symptoms that suggest any further testing/ screening at this time.  Social distancing reinforced today.  Recommended follow-up:  As above  Relevant cardiac medications were reviewed at length with the patient today.   The patient does not have concerns regarding their medications at this time.   The following changes were made today:  As above  Today, I have spent 14 minutes with the patient with telehealth technology discussing the above issues .    Signed, Glori Bickers, MD  1:59 PM   [facility] [place] [address] [facility] [place] [address] [phone] (office) [phone] (fax)

## 2019-02-02 MED FILL — MECLIZINE 25 MG TABLET: 25 | 10 days supply | Qty: 30 | Fill #2

## 2019-02-03 DIAGNOSIS — G4733 Obstructive sleep apnea (adult) (pediatric): Secondary | ICD-10-CM | POA: Diagnosis not present

## 2019-02-07 MED FILL — TORSEMIDE 20 MG TABLET: 20 | 30 days supply | Qty: 60 | Fill #2

## 2019-02-08 DIAGNOSIS — G4733 Obstructive sleep apnea (adult) (pediatric): Secondary | ICD-10-CM | POA: Diagnosis not present

## 2019-02-13 ENCOUNTER — Encounter: Payer: Self-pay | Admitting: Family Medicine

## 2019-02-13 ENCOUNTER — Other Ambulatory Visit (HOSPITAL_COMMUNITY): Payer: Medicaid Other

## 2019-02-13 ENCOUNTER — Ambulatory Visit (INDEPENDENT_AMBULATORY_CARE_PROVIDER_SITE_OTHER): Payer: Medicaid Other | Admitting: Family Medicine

## 2019-02-13 ENCOUNTER — Other Ambulatory Visit: Payer: Self-pay

## 2019-02-13 VITALS — BP 116/70 | HR 84 | Temp 98.2°F | Ht 69.0 in | Wt 294.0 lb

## 2019-02-13 DIAGNOSIS — N183 Chronic kidney disease, stage 3 unspecified: Secondary | ICD-10-CM

## 2019-02-13 DIAGNOSIS — Z09 Encounter for follow-up examination after completed treatment for conditions other than malignant neoplasm: Secondary | ICD-10-CM | POA: Diagnosis not present

## 2019-02-13 DIAGNOSIS — Z6841 Body Mass Index (BMI) 40.0 and over, adult: Secondary | ICD-10-CM | POA: Diagnosis not present

## 2019-02-13 DIAGNOSIS — R739 Hyperglycemia, unspecified: Secondary | ICD-10-CM | POA: Insufficient documentation

## 2019-02-13 DIAGNOSIS — E1122 Type 2 diabetes mellitus with diabetic chronic kidney disease: Secondary | ICD-10-CM

## 2019-02-13 DIAGNOSIS — E66813 Obesity, class 3: Secondary | ICD-10-CM

## 2019-02-13 DIAGNOSIS — Z794 Long term (current) use of insulin: Secondary | ICD-10-CM

## 2019-02-13 DIAGNOSIS — E559 Vitamin D deficiency, unspecified: Secondary | ICD-10-CM

## 2019-02-13 LAB — POCT URINALYSIS DIP (MANUAL ENTRY)
Bilirubin, UA: NEGATIVE
Blood, UA: NEGATIVE
Glucose, UA: NEGATIVE mg/dL
Ketones, POC UA: NEGATIVE mg/dL
Leukocytes, UA: NEGATIVE
Nitrite, UA: NEGATIVE
Protein Ur, POC: NEGATIVE mg/dL
Spec Grav, UA: 1.01 (ref 1.010–1.025)
Urobilinogen, UA: 0.2 E.U./dL
pH, UA: 6.5 (ref 5.0–8.0)

## 2019-02-13 LAB — GLUCOSE, POCT (MANUAL RESULT ENTRY): POC Glucose: 211 mg/dl — AB (ref 70–99)

## 2019-02-13 MED ORDER — VITAMIN D (ERGOCALCIFEROL) 1.25 MG (50000 UNIT) PO CAPS: 50000.0000 [IU] | ORAL_CAPSULE | ORAL | 5 refills | Status: DC

## 2019-02-13 MED ORDER — METFORMIN HCL 1000 MG PO TABS: 1000.0000 mg | ORAL_TABLET | Freq: Two times a day (BID) | ORAL | 3 refills | Status: DC

## 2019-02-13 MED ORDER — GLIPIZIDE 10 MG PO TABS: 10.0000 mg | ORAL_TABLET | Freq: Two times a day (BID) | ORAL | 3 refills | Status: DC

## 2019-02-13 MED FILL — metFORMIN HCL 1000 MG TABS: 1000 | 90 days supply | Qty: 180 | Fill #0

## 2019-02-13 MED FILL — glipiZIDE 10 MG TABS: 10 | 90 days supply | Qty: 180 | Fill #0

## 2019-02-13 MED FILL — VIT D2 1.25 MG (50,000 UNIT: 1.25 MG | 84 days supply | Qty: 12 | Fill #0

## 2019-02-13 NOTE — Progress Notes (Signed)
Patient [name] Follow Up  Subjective:  Patient ID: Jeffrey [name], male    DOB: 01/21/1976  Age: 43 y.o. MRN: [id number]  CC:  Chief Complaint  Patient presents with   Hospitalization Follow-up    HPI Jeffrey CRATTY is a 43 year old male who presents for Hospital Follow Up today.   Past Medical History:  Diagnosis Date   Anxiety    Aspirin allergy    Childhood asthma    Chronic systolic CHF (congestive heart failure) ([place])    a. EF 20-25% in 2012. b. EF 45-50% in 10/2011 with nonischemic nuc - presumed NICM. c. 12/2014 Echo: Sev depressed LV fxn, sev dil LV, mild LVH, mild MR, sev dil LA, mildly reduced RV fxn.   CKD (chronic kidney disease) stage 2, GFR 60-89 ml/min    High cholesterol    Hypertension    Morbid obesity ([place])    Nephrolithiasis    OSA on CPAP    Presumed NICM    a. 04/2014 Myoview: EF 26%, glob HK, sev glob HK, ? prior infarct;  b. Never cathed 2/2 CKD.   Renal cell carcinoma ([name])    a. s/p Rt robotic assisted partial converted to radical nephrectomy on 01/2013.   Troponin level elevated    a. 04/2014, 12/2014: felt due to CHF.   Type II diabetes mellitus ([name])    Ventricular tachycardia ([name])    a. appropriate ICD therapy 12/2017   Current Status: Since his last office visit, he has had a Hospital Admission on 01/19/2019-01/23/2019 for Hyperglycemia. Today, he states that he is doing much better since his hospital discharge. He states that his vision and neuropathy is better now. His most recent normal range of preprandial blood glucose levels have been between 130-145.  He has seen low range of 95 and high of 200 since his his last hospital stay.  He denies fatigue, frequent urination, blurred vision, excessive hunger, excessive thirst, weight gain, weight loss, and poor wound healing. He continues to check his feet regularly. He states that he has been 'juicing' for the past few months and using a  lot of fruits. He states that he also eats a lot of fruits.    He denies fevers, chills, fatigue, recent infections, weight loss, and night sweats. He has not had any falls. No chest pain, heart palpitations, cough and shortness of breath reported. No reports of GI problems such as diarrhea, and constipation. He has no reports of blood in stools, dysuria and hematuria. No depression or anxiety reported. He denies pain today.   Past Surgical History:  Procedure Laterality Date   APPENDECTOMY  07/2004   CARDIAC CATHETERIZATION N/A 05/17/2015   Procedure: Right/Left Heart Cath and Coronary Angiography;  Surgeon: Jolaine Artist, MD;  Location: [facility];  Service: Cardiovascular;  Laterality: N/A;   EP IMPLANTABLE DEVICE N/A 05/17/2015   Procedure: SubQ ICD Implant;  Surgeon: Will Meredith Leeds, MD;  Location: [facility];  Service: Cardiovascular;  Laterality: N/A;   ROBOTIC ASSISTED LAPAROSCOPIC LYSIS OF ADHESION  01/13/2013   Procedure: ROBOTIC ASSISTED LAPAROSCOPIC LYSIS OF ADHESION EXTENSIVE;  Surgeon: Alexis Frock, MD;  Location: [facility];  Service: Urology;;   ROBOTIC ASSITED PARTIAL NEPHRECTOMY Right 01/13/2013   Procedure: ROBOTIC ASSITED PARTIAL NEPHRECTOMY CONVERTED TO ROBOTIC ASSISTED RIGHT RADICAL NEPHRECTOMY;  Surgeon: Alexis Frock, MD;  Location: [facility];  Service: Urology;  Laterality: Right;  Family History  Problem Relation Age of Onset   Hypertension Mother    Diabetes Maternal Aunt    Heart attack Neg Hx    Stroke Neg Hx     Social History   Socioeconomic History   Marital status: Significant Other    Spouse name: Not on file   Number of children: Not on file   Years of education: Not on file   Highest education level: Not on file  Occupational History   Not on file  Social Needs   Financial resource strain: Not on file   Food insecurity:    Worry: Not on file    Inability: Not on file   Transportation needs:    Medical:  Not on file    Non-medical: Not on file  Tobacco Use   Smoking status: Former Smoker    Packs/day: 0.25    Years: 22.00    Pack years: 5.50    Last attempt to quit: 04/16/2015    Years since quitting: 3.8   Smokeless tobacco: Never Used  Substance and Sexual Activity   Alcohol use: No    Comment: occ    Drug use: Yes    Frequency: 14.0 times per week    Types: Marijuana    Comment: uses marijuana twice a day   Sexual activity: Yes    Birth control/protection: Condom  Lifestyle   Physical activity:    Days per week: Not on file    Minutes per session: Not on file   Stress: Not on file  Relationships   Social connections:    Talks on phone: Not on file    Gets together: Not on file    Attends religious service: Not on file    Active member of club or organization: Not on file    Attends meetings of clubs or organizations: Not on file    Relationship status: Not on file   Intimate partner violence:    Fear of current or ex partner: Not on file    Emotionally abused: Not on file    Physically abused: Not on file    Forced sexual activity: Not on file  Other Topics Concern   Not on file  Social History Narrative   Not on file    Outpatient Medications Prior to Visit  Medication Sig Dispense Refill   allopurinol (ZYLOPRIM) 100 MG tablet Take 1 tablet (100 mg total) by mouth daily. 90 tablet 3   amiodarone (PACERONE) 200 MG tablet Take 200 mg by mouth daily.     apixaban (ELIQUIS) 5 MG TABS tablet Take 1 tablet (5 mg total) by mouth 2 (two) times daily. 60 tablet 10   aspirin EC 81 MG tablet Take 81 mg by mouth daily.     atorvastatin (LIPITOR) 10 MG tablet Take 1 tablet (10 mg total) by mouth daily. 30 tablet 3   blood glucose meter kit and supplies Dispense based on patient and insurance preference. Use up to four times daily as directed. (FOR ICD-10 E10.9, E11.9). 1 each 0   carvedilol (COREG) 3.125 MG tablet Take 1 tablet (3.125 mg total) by mouth 2  (two) times daily. 60 tablet 2   cetirizine (ZYRTEC) 10 MG tablet Take 1 tablet (10 mg total) by mouth daily. 30 tablet 6   fluticasone (FLONASE) 50 MCG/ACT nasal spray Place 2 sprays into both nostrils daily. 16 g 6   gabapentin (NEURONTIN) 300 MG capsule TAKE 1 CAPSULE BY MOUTH 3 TIMES DAILY. 90 capsule  3   insulin aspart (NOVOLOG FLEXPEN) 100 UNIT/ML FlexPen Inject 0-15 Units into the skin 3 (three) times daily with meals. CBG 121-150: 2u; CBG 151-200: 3u; CBG 201-250: 5u; CBG 251-300: 8u; CBG 301-350: 11u; CBG 351-400:15 u; CBG>400: 15u 15 mL 1   insulin NPH-regular Human (70-30) 100 UNIT/ML injection Inject 38 Units into the skin 2 (two) times daily with a meal. 60 mL 3   Insulin Pen Needle 32G X 6 MM MISC Use with insulin sliding scale pen 100 each 1   isosorbide-hydrALAZINE (BIDIL) 20-37.5 MG tablet Take 1 tablet by mouth 3 (three) times daily. 90 tablet 5   Magnesium Oxide 200 MG TABS Take 100 mg by mouth daily.     meclizine (ANTIVERT) 25 MG tablet Take 1 tablet (25 mg total) by mouth 3 (three) times daily. 30 tablet 4   MITIGARE 0.6 MG CAPS Take 0.6 mg by mouth as needed (Gout).   3   ondansetron (ZOFRAN) 4 MG tablet Take 1 tablet (4 mg total) by mouth every 8 (eight) hours as needed for nausea or vomiting. 20 tablet 1   sacubitril-valsartan (ENTRESTO) 97-103 MG Take 1 tablet by mouth 2 (two) times daily. 180 tablet 3   spironolactone (ALDACTONE) 25 MG tablet Take 0.5 tablets (12.5 mg total) by mouth daily. 45 tablet 3   torsemide (DEMADEX) 20 MG tablet Take 2 tablets (40 mg total) by mouth daily. 60 tablet 3   fluconazole (DIFLUCAN) 200 MG tablet Take 1 tablet (200 mg total) by mouth daily. 10 tablet 0   Vitamin D, Ergocalciferol, (DRISDOL) 1.25 MG (50000 UT) CAPS capsule Take 50,000 Units by mouth every 30 (thirty) days.  5   No facility-administered medications prior to visit.     Allergies  Allergen Reactions   Aspirin Shortness Of Breath, Itching and Rash      Burning sensation (Patient reports he tolerates other NSAIDS)    Bee Venom Hives and Swelling   Lisinopril Cough   Tomato Rash    ROS Review of Systems  Constitutional: Negative.   HENT: Negative.   Eyes: Negative.   Respiratory: Negative.   Cardiovascular: Negative.   Gastrointestinal: Positive for abdominal distention (obese).  Endocrine: Negative.   Genitourinary: Negative.   Musculoskeletal: Negative.   Skin: Negative.   Allergic/Immunologic: Negative.   Neurological: Positive for dizziness and headaches.  Hematological: Negative.   Psychiatric/Behavioral: Negative.    Objective:    Physical Exam  Constitutional: He is oriented to person, place, and time. He appears well-developed and well-nourished.  HENT:  Head: Normocephalic and atraumatic.  Eyes: Conjunctivae are normal.  Neck: Normal range of motion. Neck supple.  Cardiovascular: Normal rate, regular rhythm, normal heart sounds and intact distal pulses.  Pulmonary/Chest: Effort normal and breath sounds normal.  Abdominal: Soft. Bowel sounds are normal.  Musculoskeletal: Normal range of motion.  Neurological: He is alert and oriented to person, place, and time. He has normal reflexes.  Skin: Skin is warm and dry.  Psychiatric: He has a normal mood and affect. His behavior is normal. Judgment and thought content normal.  Nursing note and vitals reviewed.   BP 116/70 (BP Location: Left Arm, Patient Position: Sitting, Cuff Size: Large)    Pulse 84    Temp 98.2 F (36.8 C) (Oral)    Ht '5\' 9"'  (1.753 m)    Wt 294 lb (133.4 kg)    SpO2 98%    BMI 43.42 kg/m  Wt Readings from Last 3 Encounters:  02/13/19 294  lb (133.4 kg)  01/19/19 285 lb 15 oz (129.7 kg)  12/30/18 286 lb (129.7 kg)     Health Maintenance Due  Topic Date Due   OPHTHALMOLOGY EXAM  11/02/1984    There are no preventive care reminders to display for this patient.  Lab Results  Component Value Date   TSH 1.874 01/19/2019   Lab Results    Component Value Date   WBC 8.8 01/21/2019   HGB 12.4 (L) 01/22/2019   HCT 39.8 01/22/2019   MCV 79.4 (L) 01/21/2019   PLT 226 01/21/2019   Lab Results  Component Value Date   NA 138 01/23/2019   K 4.2 01/23/2019   CO2 22 01/23/2019   GLUCOSE 193 (H) 01/23/2019   BUN 15 01/23/2019   CREATININE 1.22 01/23/2019   BILITOT 0.8 01/19/2019   ALKPHOS 109 01/19/2019   AST 18 01/19/2019   ALT 22 01/19/2019   PROT 7.6 01/19/2019   ALBUMIN 3.7 01/19/2019   CALCIUM 9.2 01/23/2019   ANIONGAP 6 01/23/2019   GFR 58.42 (L) 01/22/2015   Lab Results  Component Value Date   CHOL 197 11/28/2018   Lab Results  Component Value Date   HDL 27 (L) 11/28/2018   Lab Results  Component Value Date   LDLCALC Comment 11/28/2018   Lab Results  Component Value Date   TRIG 489 (H) 11/28/2018   Lab Results  Component Value Date   CHOLHDL 7.3 (H) 11/28/2018   Lab Results  Component Value Date   HGBA1C >15.5 (H) 01/22/2019     Assessment & Plan:   1. Hospital discharge follow-up  2. Type 2 diabetes mellitus with stage 3 chronic kidney disease, with long-term current use of insulin (HCC) Hgb A1c is increased. We will initiate Metformin and Glucotrol today for better management of blood glucose levels. He will decrease fruit intake to once daily. He will continue to decrease foods/beverages high in sugars and carbs and follow Heart Healthy or DASH diet. Increase physical activity to at least 30 minutes cardio exercise daily.  - POCT urinalysis dipstick - POCT glucose (manual entry) - POCT glycosylated hemoglobin (Hb A1C) - glipiZIDE (GLUCOTROL) 10 MG tablet; Take 1 tablet (10 mg total) by mouth 2 (two) times daily before a meal.  Dispense: 60 tablet; Refill: 3 - metFORMIN (GLUCOPHAGE) 1000 MG tablet; Take 1 tablet (1,000 mg total) by mouth 2 (two) times daily with a meal.  Dispense: 60 tablet; Refill: 3  3. Hyperglycemia Blood glucose is elevated at 211 today. We will continue to monitor.   - glipiZIDE (GLUCOTROL) 10 MG tablet; Take 1 tablet (10 mg total) by mouth 2 (two) times daily before a meal.  Dispense: 60 tablet; Refill: 3 - metFORMIN (GLUCOPHAGE) 1000 MG tablet; Take 1 tablet (1,000 mg total) by mouth 2 (two) times daily with a meal.  Dispense: 60 tablet; Refill: 3  4. Class 3 severe obesity due to excess calories with serious comorbidity and body mass index (BMI) of 50.0 to 59.9 in adult (HCC) 10 lb weight increase in 1 month. Body mass index is 43.42 kg/m. Goal BMI  is <30. Encouraged efforts to reduce weight include engaging in physical activity as tolerated with goal of 150 minutes per week. Improve dietary choices and eat a meal regimen consistent with a Mediterranean or DASH diet. Reduce simple carbohydrates. Do not skip meals and eat healthy snacks throughout the day to avoid over-eating at dinner. Set a goal weight loss that is achievable for you.  5. Vitamin D deficiency - Vitamin D, Ergocalciferol, (DRISDOL) 1.25 MG (50000 UT) CAPS capsule; Take 1 capsule (50,000 Units total) by mouth every 30 (thirty) days.  Dispense: 5 capsule; Refill: 5  6. Follow up He will follow up in 1 month.   Meds ordered this encounter  Medications   Vitamin D, Ergocalciferol, (DRISDOL) 1.25 MG (50000 UT) CAPS capsule    Sig: Take 1 capsule (50,000 Units total) by mouth every 30 (thirty) days.    Dispense:  5 capsule    Refill:  5   glipiZIDE (GLUCOTROL) 10 MG tablet    Sig: Take 1 tablet (10 mg total) by mouth 2 (two) times daily before a meal.    Dispense:  60 tablet    Refill:  3   metFORMIN (GLUCOPHAGE) 1000 MG tablet    Sig: Take 1 tablet (1,000 mg total) by mouth 2 (two) times daily with a meal.    Dispense:  60 tablet    Refill:  3    Orders Placed This Encounter  Procedures   POCT urinalysis dipstick   POCT glucose (manual entry)    Referral Orders  No referral(s) requested today    Kathe Becton,  MSN, FNP-C [facility] [address] [place], [place] [address] [phone]   Problem List Items Addressed This Visit      Endocrine   Type 2 diabetes mellitus with stage 3 chronic kidney disease, with long-term current use of insulin (HCC) (Chronic)   Relevant Medications   glipiZIDE (GLUCOTROL) 10 MG tablet   metFORMIN (GLUCOPHAGE) 1000 MG tablet   Other Relevant Orders   POCT urinalysis dipstick (Completed)   POCT glucose (manual entry) (Completed)   POCT glycosylated hemoglobin (Hb A1C)     Other   Class 3 severe obesity due to excess calories with serious comorbidity and body mass index (BMI) of 50.0 to 59.9 in adult [facility])   Relevant Medications   glipiZIDE (GLUCOTROL) 10 MG tablet   metFORMIN (GLUCOPHAGE) 1000 MG tablet    Other Visit Diagnoses    Hospital discharge follow-up    -  Primary   Hyperglycemia       Relevant Medications   glipiZIDE (GLUCOTROL) 10 MG tablet   metFORMIN (GLUCOPHAGE) 1000 MG tablet   Vitamin D deficiency       Relevant Medications   Vitamin D, Ergocalciferol, (DRISDOL) 1.25 MG (50000 UT) CAPS capsule      Meds ordered this encounter  Medications   Vitamin D, Ergocalciferol, (DRISDOL) 1.25 MG (50000 UT) CAPS capsule    Sig: Take 1 capsule (50,000 Units total) by mouth every 30 (thirty) days.    Dispense:  5 capsule    Refill:  5   glipiZIDE (GLUCOTROL) 10 MG tablet    Sig: Take 1 tablet (10 mg total) by mouth 2 (two) times daily before a meal.    Dispense:  60 tablet    Refill:  3   metFORMIN (GLUCOPHAGE) 1000 MG tablet    Sig: Take 1 tablet (1,000 mg total) by mouth 2 (two) times daily with a meal.    Dispense:  60 tablet    Refill:  3    Follow-up: Return in about 1 month (around 03/16/2019).    [name], FNP

## 2019-02-13 NOTE — Patient Instructions (Signed)
Glipizide tablets What is this medicine? GLIPIZIDE (GLIP i zide) helps to treat type 2 diabetes. Treatment is combined with diet and exercise. The medicine helps your body to use insulin better. This medicine may be used for other purposes; ask your health care provider or pharmacist if you have questions. COMMON BRAND NAME(S): Glucotrol What should I tell my health care provider before I take this medicine? They need to know if you have any of these conditions: -diabetic ketoacidosis -glucose-6-phosphate dehydrogenase deficiency -heart disease -kidney disease -liver disease -porphyria -severe infection or injury -thyroid disease -an unusual or allergic reaction to glipizide, sulfa drugs, other medicines, foods, dyes, or preservatives -pregnant or trying to get pregnant -breast-feeding How should I use this medicine? Take this medicine by mouth. Swallow with a drink of water. Do not take with food. Take it 30 minutes before a meal. Follow the directions on the prescription label. If you take this medicine once a day, take it 30 minutes before breakfast. Take your doses at the same time each day. Do not take more often than directed. Talk to your pediatrician regarding the use of this medicine in children. Special care may be needed. Elderly patients over 28 years old may have a stronger reaction and need a smaller dose. Overdosage: If you think you have taken too much of this medicine contact a poison control center or emergency room at once. NOTE: This medicine is only for you. Do not share this medicine with others. What if I miss a dose? If you miss a dose, take it as soon as you can. If it is almost time for your next dose, take only that dose. Do not take double or extra doses. What may interact with this medicine? -bosentan -chloramphenicol -cisapride -clarithromycin -medicines for fungal or yeast infections -metoclopramide -probenecid -warfarin Many medications may cause an  increase or decrease in blood sugar, these include: -alcohol containing beverages -aspirin and aspirin-like drugs -chloramphenicol -chromium -diuretics -male hormones, like estrogens or progestins and birth control pills -heart medicines -isoniazid -male hormones or anabolic steroids -medicines for weight loss -medicines for allergies, asthma, cold, or cough -medicines for mental problems -medicines called MAO Inhibitors like Nardil, Parnate, Marplan, Eldepryl -niacin -NSAIDs, medicines for pain and inflammation, like ibuprofen or naproxen -pentamidine -phenytoin -probenecid -quinolone antibiotics like ciprofloxacin, levofloxacin, ofloxacin -some herbal dietary supplements -steroid medicines like prednisone or cortisone -thyroid medicine This list may not describe all possible interactions. Give your health care provider a list of all the medicines, herbs, non-prescription drugs, or dietary supplements you use. Also tell them if you smoke, drink alcohol, or use illegal drugs. Some items may interact with your medicine. What should I watch for while using this medicine? Visit your doctor or health care professional for regular checks on your progress. A test called the HbA1C (A1C) will be monitored. This is a simple blood test. It measures your blood sugar control over the last 2 to 3 months. You will receive this test every 3 to 6 months. Learn how to check your blood sugar. Learn the symptoms of low and high blood sugar and how to manage them. Always carry a quick-source of sugar with you in case you have symptoms of low blood sugar. Examples include hard sugar candy or glucose tablets. Make sure others know that you can choke if you eat or drink when you develop serious symptoms of low blood sugar, such as seizures or unconsciousness. They must get medical help at once. Tell your doctor or health  care professional if you have high blood sugar. You might need to change the dose of  your medicine. If you are sick or exercising more than usual, you might need to change the dose of your medicine. Do not skip meals. Ask your doctor or health care professional if you should avoid alcohol. Many nonprescription cough and cold products contain sugar or alcohol. These can affect blood sugar. This medicine can make you more sensitive to the sun. Keep out of the sun. If you cannot avoid being in the sun, wear protective clothing and use sunscreen. Do not use sun lamps or tanning beds/booths. Wear a medical ID bracelet or chain, and carry a card that describes your disease and details of your medicine and dosage times. What side effects may I notice from receiving this medicine? Side effects that you should report to your doctor or health care professional as soon as possible: -allergic reactions like skin rash, itching or hives, swelling of the face, lips, or tongue -breathing problems -dark urine -fever, chills, sore throat -signs and symptoms of low blood sugar such as feeling anxious, confusion, dizziness, increased hunger, unusually weak or tired, sweating, shakiness, cold, irritable, headache, blurred vision, fast heartbeat, loss of consciousness -unusual bleeding or bruising -yellowing of the eyes or skin Side effects that usually do not require medical attention (report to your doctor or health care professional if they continue or are bothersome): -diarrhea -dizziness -headache -heartburn -nausea -stomach gas This list may not describe all possible side effects. Call your doctor for medical advice about side effects. You may report side effects to FDA at 1-800-FDA-1088. Where should I keep my medicine? Keep out of the reach of children. Store at room temperature below 30 degrees C (86 degrees F). Throw away any unused medicine after the expiration date. NOTE: This sheet is a summary. It may not cover all possible information. If you have questions about this medicine, talk  to your doctor, pharmacist, or health care provider.  2019 [employer] Standard (2013-01-04 14:42:46) Metformin tablets What is this medicine? METFORMIN (met FOR min) is used to treat type 2 diabetes. It helps to control blood sugar. Treatment is combined with diet and exercise. This medicine can be used alone or with other medicines for diabetes. This medicine may be used for other purposes; ask your health care provider or pharmacist if you have questions. COMMON BRAND NAME(S): Glucophage What should I tell my health care provider before I take this medicine? They need to know if you have any of these conditions: -anemia -dehydration -heart disease -frequently drink alcohol-containing beverages -kidney disease -liver disease -polycystic ovary syndrome -serious infection or injury -vomiting -an unusual or allergic reaction to metformin, other medicines, foods, dyes, or preservatives -pregnant or trying to get pregnant -breast-feeding How should I use this medicine? Take this medicine by mouth with a glass of water. Follow the directions on the prescription label. Take this medicine with food. Take your medicine at regular intervals. Do not take your medicine more often than directed. Do not stop taking except on your doctor's advice. Talk to your pediatrician regarding the use of this medicine in children. While this drug may be prescribed for children as young as 71 years of age for selected conditions, precautions do apply. Overdosage: If you think you have taken too much of this medicine contact a poison control center or emergency room at once. NOTE: This medicine is only for you. Do not share this medicine with others. What if I miss  a dose? If you miss a dose, take it as soon as you can. If it is almost time for your next dose, take only that dose. Do not take double or extra doses. What may interact with this medicine? Do not take this medicine with any of the following  medications: -certain contrast medicines given before X-rays, CT scans, MRI, or other procedures -dofetilide This medicine may also interact with the following medications: -acetazolamide -alcohol -certain antivirals for HIV or hepatitis -certain medicines for blood pressure, heart disease, irregular heart beat -cimetidine -dichlorphenamide -digoxin -diuretics -male hormones, like estrogens or progestins and birth control pills -glycopyrrolate -isoniazid -lamotrigine -memantine -methazolamide -metoclopramide -midodrine -niacin -phenothiazines like chlorpromazine, mesoridazine, prochlorperazine, thioridazine -phenytoin -ranolazine -steroid medicines like prednisone or cortisone -stimulant medicines for attention disorders, weight loss, or to stay awake -thyroid medicines -topiramate -trospium -vandetanib -zonisamide This list may not describe all possible interactions. Give your health care provider a list of all the medicines, herbs, non-prescription drugs, or dietary supplements you use. Also tell them if you smoke, drink alcohol, or use illegal drugs. Some items may interact with your medicine. What should I watch for while using this medicine? Visit your doctor or health care professional for regular checks on your progress. A test called the HbA1C (A1C) will be monitored. This is a simple blood test. It measures your blood sugar control over the last 2 to 3 months. You will receive this test every 3 to 6 months. Learn how to check your blood sugar. Learn the symptoms of low and high blood sugar and how to manage them. Always carry a quick-source of sugar with you in case you have symptoms of low blood sugar. Examples include hard sugar candy or glucose tablets. Make sure others know that you can choke if you eat or drink when you develop serious symptoms of low blood sugar, such as seizures or unconsciousness. They must get medical help at once. Tell your doctor or health  care professional if you have high blood sugar. You might need to change the dose of your medicine. If you are sick or exercising more than usual, you might need to change the dose of your medicine. Do not skip meals. Ask your doctor or health care professional if you should avoid alcohol. Many nonprescription cough and cold products contain sugar or alcohol. These can affect blood sugar. This medicine may cause ovulation in premenopausal women who do not have regular monthly periods. This may increase your chances of becoming pregnant. You should not take this medicine if you become pregnant or think you may be pregnant. Talk with your doctor or health care professional about your birth control options while taking this medicine. Contact your doctor or health care professional right away if you think you are pregnant. If you are going to need surgery, a MRI, CT scan, or other procedure, tell your doctor that you are taking this medicine. You may need to stop taking this medicine before the procedure. Wear a medical ID bracelet or chain, and carry a card that describes your disease and details of your medicine and dosage times. This medicine may cause a decrease in folic acid and vitamin B12. You should make sure that you get enough vitamins while you are taking this medicine. Discuss the foods you eat and the vitamins you take with your health care professional. What side effects may I notice from receiving this medicine? Side effects that you should report to your doctor or health care professional as soon  as possible: -allergic reactions like skin rash, itching or hives, swelling of the face, lips, or tongue -breathing problems -feeling faint or lightheaded, falls -muscle aches or pains -signs and symptoms of low blood sugar such as feeling anxious, confusion, dizziness, increased hunger, unusually weak or tired, sweating, shakiness, cold, irritable, headache, blurred vision, fast heartbeat, loss of  consciousness -slow or irregular heartbeat -unusual stomach pain or discomfort -unusually tired or weak Side effects that usually do not require medical attention (report to your doctor or health care professional if they continue or are bothersome): -diarrhea -headache -heartburn -metallic taste in mouth -nausea -stomach gas, upset This list may not describe all possible side effects. Call your doctor for medical advice about side effects. You may report side effects to FDA at 1-800-FDA-1088. Where should I keep my medicine? Keep out of the reach of children. Store at room temperature between 15 and 30 degrees C (59 and 86 degrees F). Protect from moisture and light. Throw away any unused medicine after the expiration date. NOTE: This sheet is a summary. It may not cover all possible information. If you have questions about this medicine, talk to your doctor, pharmacist, or health care provider.  2019 [employer] Standard (2017-10-28 19:15:19)

## 2019-02-16 ENCOUNTER — Other Ambulatory Visit: Payer: Self-pay | Admitting: Cardiology

## 2019-02-16 ENCOUNTER — Other Ambulatory Visit (HOSPITAL_COMMUNITY): Payer: Medicaid Other

## 2019-02-16 MED FILL — ATORVASTATIN 10 MG TABLET: 10 | 30 days supply | Qty: 30 | Fill #2

## 2019-02-16 MED FILL — CETIRIZINE HCL 10 MG TABS: 10 | 30 days supply | Qty: 30 | Fill #2

## 2019-02-17 MED FILL — MECLIZINE 25 MG TABLET: 25 | 10 days supply | Qty: 30 | Fill #3

## 2019-02-17 NOTE — Telephone Encounter (Signed)
This is a CHF pt 

## 2019-02-20 ENCOUNTER — Other Ambulatory Visit: Payer: Self-pay

## 2019-02-20 ENCOUNTER — Ambulatory Visit (HOSPITAL_COMMUNITY)
Admission: RE | Admit: 2019-02-20 | Discharge: 2019-02-20 | Disposition: A | Discharge status: Home / Self Care | Payer: Medicaid Other | Source: Ambulatory Visit | Attending: Internal Medicine | Admitting: Internal Medicine

## 2019-02-20 ENCOUNTER — Ambulatory Visit: Payer: Medicaid Other | Admitting: Family Medicine

## 2019-02-20 DIAGNOSIS — I5022 Chronic systolic (congestive) heart failure: Secondary | ICD-10-CM

## 2019-02-20 LAB — BASIC METABOLIC PANEL
Anion gap: 9 (ref 5–15)
BUN: 31 mg/dL — ABNORMAL HIGH (ref 6–20)
CO2: 20 mmol/L — ABNORMAL LOW (ref 22–32)
Calcium: 9.9 mg/dL (ref 8.9–10.3)
Chloride: 108 mmol/L (ref 98–111)
Creatinine, Ser: 1.52 mg/dL — ABNORMAL HIGH (ref 0.61–1.24)
GFR calc Af Amer: >60 mL/min (ref >60)
GFR calc non Af Amer: 55 mL/min — ABNORMAL LOW (ref >60)
Glucose, Bld: 143 mg/dL — ABNORMAL HIGH (ref 70–99)
Potassium: 4.4 mmol/L (ref 3.5–5.1)
Sodium: 137 mmol/L (ref 135–145)

## 2019-02-20 MED ORDER — AMIODARONE HCL 200 MG PO TABS: 200.0000 mg | ORAL_TABLET | Freq: Every day | ORAL | 3 refills | Status: DC

## 2019-02-20 MED FILL — AMIODARONE HCL 200 MG TAB: 200 | 90 days supply | Qty: 90 | Fill #0

## 2019-02-20 NOTE — Progress Notes (Unsigned)
Medication has been sent to pharmacy.  °

## 2019-02-23 MED FILL — ENTRESTO 97 MG-103 MG TAB: 97-103 | 30 days supply | Qty: 60 | Fill #3

## 2019-02-23 MED FILL — ELIQUIS 5 MG TABLET: 5 | 30 days supply | Qty: 60 | Fill #3

## 2019-03-06 DIAGNOSIS — G4733 Obstructive sleep apnea (adult) (pediatric): Secondary | ICD-10-CM | POA: Diagnosis not present

## 2019-03-08 MED FILL — TRUEPLUS PEN NDL 31G X 1/4": 31G X 6 MM | 20 days supply | Qty: 100 | Fill #1

## 2019-03-08 MED FILL — TRUEPLUS PEN NDL 31G X 1/4: 31G X 6 MM | 20 days supply | Qty: 100 | Fill #1

## 2019-03-08 MED FILL — ALLOPURINOL 100 MG TABLET: 100 | 30 days supply | Qty: 60 | Fill #3

## 2019-03-16 ENCOUNTER — Telehealth: Payer: Self-pay

## 2019-03-16 NOTE — Telephone Encounter (Signed)
Left a vm for patient to callback and do the screening for appointment

## 2019-03-17 ENCOUNTER — Other Ambulatory Visit: Payer: Self-pay

## 2019-03-17 ENCOUNTER — Encounter: Payer: Self-pay | Admitting: Family Medicine

## 2019-03-17 ENCOUNTER — Ambulatory Visit (INDEPENDENT_AMBULATORY_CARE_PROVIDER_SITE_OTHER): Payer: Medicaid Other | Admitting: Family Medicine

## 2019-03-17 VITALS — BP 114/66 | HR 77 | Temp 97.8°F | Ht 69.0 in | Wt 308.0 lb

## 2019-03-17 DIAGNOSIS — R739 Hyperglycemia, unspecified: Secondary | ICD-10-CM | POA: Diagnosis not present

## 2019-03-17 DIAGNOSIS — N183 Chronic kidney disease, stage 3 unspecified: Secondary | ICD-10-CM

## 2019-03-17 DIAGNOSIS — Z09 Encounter for follow-up examination after completed treatment for conditions other than malignant neoplasm: Secondary | ICD-10-CM

## 2019-03-17 DIAGNOSIS — E1122 Type 2 diabetes mellitus with diabetic chronic kidney disease: Secondary | ICD-10-CM | POA: Diagnosis not present

## 2019-03-17 DIAGNOSIS — Z794 Long term (current) use of insulin: Secondary | ICD-10-CM

## 2019-03-17 DIAGNOSIS — Z6841 Body Mass Index (BMI) 40.0 and over, adult: Secondary | ICD-10-CM

## 2019-03-17 DIAGNOSIS — E66813 Obesity, class 3: Secondary | ICD-10-CM

## 2019-03-17 LAB — POCT URINALYSIS DIP (MANUAL ENTRY)
Bilirubin, UA: NEGATIVE
Blood, UA: NEGATIVE
Glucose, UA: NEGATIVE mg/dL
Ketones, POC UA: NEGATIVE mg/dL
Nitrite, UA: NEGATIVE
Protein Ur, POC: NEGATIVE mg/dL
Spec Grav, UA: 1.015 (ref 1.010–1.025)
Urobilinogen, UA: 0.2 E.U./dL
pH, UA: 5 (ref 5.0–8.0)

## 2019-03-17 LAB — POCT GLYCOSYLATED HEMOGLOBIN (HGB A1C): Hemoglobin A1C: 10.7 % — AB (ref 4.0–5.6)

## 2019-03-17 LAB — GLUCOSE, POCT (MANUAL RESULT ENTRY): POC Glucose: 166 mg/dl — AB (ref 70–99)

## 2019-03-17 NOTE — Progress Notes (Signed)
Patient [facility]  Established Patient Office Visit  Subjective:  Patient ID: Jeffrey [name], male    DOB: September 11, 1976  Age: 43 y.o. MRN: [id number]  CC:  Chief Complaint  Patient presents with  . Follow-up    1 month on chronic condition     HPI Jeffrey [name] is a 43 year old male who presents for Follow Up today.   Past Medical History:  Diagnosis Date  . Anxiety   . Aspirin allergy   . Childhood asthma   . Chronic systolic CHF (congestive heart failure) ([place])    a. EF 20-25% in 2012. b. EF 45-50% in 10/2011 with nonischemic nuc - presumed NICM. c. 12/2014 Echo: Sev depressed LV fxn, sev dil LV, mild LVH, mild MR, sev dil LA, mildly reduced RV fxn.  . CKD (chronic kidney disease) stage 2, GFR 60-89 ml/min   . High cholesterol   . Hypertension   . Morbid obesity ([place])   . Nephrolithiasis   . OSA on CPAP   . Presumed NICM    a. 04/2014 Myoview: EF 26%, glob HK, sev glob HK, ? prior infarct;  b. Never cathed 2/2 CKD.  [name] Renal cell carcinoma ([place])    a. s/p Rt robotic assisted partial converted to radical nephrectomy on 01/2013.  . Troponin level elevated    a. 04/2014, 12/2014: felt due to CHF.  . Type II diabetes mellitus ([place])   . Ventricular tachycardia ([place])    a. appropriate ICD therapy 12/2017   Current Status: Since his last office visit, he is doing well with no complaints. His most recent normal range of preprandial blood glucose levels have been between 100-112. He has seen low range of 65 and high of 112 since his last office visit. He denies fatigue, frequent urination, blurred vision, excessive hunger, excessive thirst, weight gain, weight loss, and poor wound healing. He continues to check his feet regularly. He denies visual changes, chest pain, cough, shortness of breath, heart palpitations, and falls. He has occasional headaches and dizziness with position changes. Denies severe headaches, confusion, seizures, double vision,  and blurred vision, nausea and vomiting. His anxiety is mild today. She denies suicidal ideations, homicidal ideations, or auditory hallucinations.  He denies fevers, chills, recent infections, weight loss, and night sweats.  No reports of GI problems such as diarrhea, and constipation. He has no reports of blood in stools, dysuria and hematuia. He denies pain today.   Past Surgical History:  Procedure Laterality Date  . APPENDECTOMY  07/2004  . CARDIAC CATHETERIZATION N/A 05/17/2015   Procedure: Right/Left Heart Cath and Coronary Angiography;  Surgeon: Jolaine Artist, MD;  Location: [facility];  Service: Cardiovascular;  Laterality: N/A;  . EP IMPLANTABLE DEVICE N/A 05/17/2015   Procedure: SubQ ICD Implant;  Surgeon: Will Meredith Leeds, MD;  Location: [facility];  Service: Cardiovascular;  Laterality: N/A;  . ROBOTIC ASSISTED LAPAROSCOPIC LYSIS OF ADHESION  01/13/2013   Procedure: ROBOTIC ASSISTED LAPAROSCOPIC LYSIS OF ADHESION EXTENSIVE;  Surgeon: Alexis Frock, MD;  Location: [facility];  Service: Urology;;  . ROBOTIC ASSITED PARTIAL NEPHRECTOMY Right 01/13/2013   Procedure: ROBOTIC ASSITED PARTIAL NEPHRECTOMY CONVERTED TO ROBOTIC ASSISTED RIGHT RADICAL NEPHRECTOMY;  Surgeon: Alexis Frock, MD;  Location: [facility];  Service: Urology;  Laterality: Right;    Family History  Problem Relation Age of Onset  . Hypertension Mother   . Diabetes Maternal Aunt   . Heart attack  Neg Hx   . Stroke Neg Hx     Social History   Socioeconomic History  . Marital status: Significant Other    Spouse name: Not on file  . Number of children: Not on file  . Years of education: Not on file  . Highest education level: Not on file  Occupational History  . Not on file  Social Needs  . Financial resource strain: Not on file  . Food insecurity    Worry: Not on file    Inability: Not on file  . Transportation needs    Medical: Not on file    Non-medical: Not on file  Tobacco Use  .  Smoking status: Former Smoker    Packs/day: 0.25    Years: 22.00    Pack years: 5.50    Quit date: 04/16/2015    Years since quitting: 3.9  . Smokeless tobacco: Never Used  Substance and Sexual Activity  . Alcohol use: No    Comment: occ   . Drug use: Yes    Frequency: 14.0 times per week    Types: Marijuana    Comment: uses marijuana twice a day  . Sexual activity: Yes    Birth control/protection: Condom  Lifestyle  . Physical activity    Days per week: Not on file    Minutes per session: Not on file  . Stress: Not on file  Relationships  . Social Herbalist on phone: Not on file    Gets together: Not on file    Attends religious service: Not on file    Active member of club or organization: Not on file    Attends meetings of clubs or organizations: Not on file    Relationship status: Not on file  . Intimate partner violence    Fear of current or ex partner: Not on file    Emotionally abused: Not on file    Physically abused: Not on file    Forced sexual activity: Not on file  Other Topics Concern  . Not on file  Social History Narrative  . Not on file    Outpatient Medications Prior to Visit  Medication Sig Dispense Refill  . allopurinol (ZYLOPRIM) 100 MG tablet Take 1 tablet (100 mg total) by mouth daily. 90 tablet 3  . amiodarone (PACERONE) 200 MG tablet Take 1 tablet (200 mg total) by mouth daily. 90 tablet 3  . apixaban (ELIQUIS) 5 MG TABS tablet Take 1 tablet (5 mg total) by mouth 2 (two) times daily. 60 tablet 10  . aspirin EC 81 MG tablet Take 81 mg by mouth daily.    . blood glucose meter kit and supplies Dispense based on patient and insurance preference. Use up to four times daily as directed. (FOR ICD-10 E10.9, E11.9). 1 each 0  . carvedilol (COREG) 3.125 MG tablet Take 1 tablet (3.125 mg total) by mouth 2 (two) times daily. 60 tablet 2  . cetirizine (ZYRTEC) 10 MG tablet Take 1 tablet (10 mg total) by mouth daily. 30 tablet 6  . fluticasone  (FLONASE) 50 MCG/ACT nasal spray Place 2 sprays into both nostrils daily. 16 g 6  . gabapentin (NEURONTIN) 300 MG capsule TAKE 1 CAPSULE BY MOUTH 3 TIMES DAILY. 90 capsule 3  . glipiZIDE (GLUCOTROL) 10 MG tablet Take 1 tablet (10 mg total) by mouth 2 (two) times daily before a meal. 60 tablet 3  . insulin aspart (NOVOLOG FLEXPEN) 100 UNIT/ML FlexPen Inject 0-15 Units into the  skin 3 (three) times daily with meals. CBG 121-150: 2u; CBG 151-200: 3u; CBG 201-250: 5u; CBG 251-300: 8u; CBG 301-350: 11u; CBG 351-400:15 u; CBG>400: 15u 15 mL 1  . insulin NPH-regular Human (70-30) 100 UNIT/ML injection Inject 38 Units into the skin 2 (two) times daily with a meal. 60 mL 3  . Insulin Pen Needle 32G X 6 MM MISC Use with insulin sliding scale pen 100 each 1  . isosorbide-hydrALAZINE (BIDIL) 20-37.5 MG tablet Take 1 tablet by mouth 3 (three) times daily. 90 tablet 5  . Magnesium Oxide 200 MG TABS Take 100 mg by mouth daily.    . meclizine (ANTIVERT) 25 MG tablet Take 1 tablet (25 mg total) by mouth 3 (three) times daily. 30 tablet 4  . metFORMIN (GLUCOPHAGE) 1000 MG tablet Take 1 tablet (1,000 mg total) by mouth 2 (two) times daily with a meal. 60 tablet 3  . MITIGARE 0.6 MG CAPS Take 0.6 mg by mouth as needed (Gout).   3  . ondansetron (ZOFRAN) 4 MG tablet Take 1 tablet (4 mg total) by mouth every 8 (eight) hours as needed for nausea or vomiting. 20 tablet 1  . sacubitril-valsartan (ENTRESTO) 97-103 MG Take 1 tablet by mouth 2 (two) times daily. 180 tablet 3  . spironolactone (ALDACTONE) 25 MG tablet Take 0.5 tablets (12.5 mg total) by mouth daily. 45 tablet 3  . torsemide (DEMADEX) 20 MG tablet Take 2 tablets (40 mg total) by mouth daily. 60 tablet 3  . Vitamin D, Ergocalciferol, (DRISDOL) 1.25 MG (50000 UT) CAPS capsule Take 1 capsule (50,000 Units total) by mouth every 30 (thirty) days. 5 capsule 5  . atorvastatin (LIPITOR) 10 MG tablet Take 1 tablet (10 mg total) by mouth daily. 30 tablet 3   No  facility-administered medications prior to visit.     Allergies  Allergen Reactions  . Aspirin Shortness Of Breath, Itching and Rash     Burning sensation (Patient reports he tolerates other NSAIDS)   . Bee Venom Hives and Swelling  . Lisinopril Cough  . Tomato Rash    ROS Review of Systems  Constitutional: Negative.   HENT: Negative.   Eyes: Negative.   Respiratory: Negative.   Cardiovascular: Negative.   Gastrointestinal: Positive for abdominal distention (distended).  Endocrine: Negative.   Genitourinary: Negative.   Musculoskeletal: Negative.   Skin: Negative.   Allergic/Immunologic: Negative.   Neurological: Positive for dizziness (occasional ) and headaches ([place]).  Hematological: Negative.   Psychiatric/Behavioral: Negative.       Objective:    Physical Exam  Constitutional: He is oriented to person, place, and time. He appears well-developed and well-nourished.  HENT:  Head: Normocephalic and atraumatic.  Eyes: Conjunctivae are normal.  Neck: Normal range of motion. Neck supple.  Cardiovascular: Normal rate, regular rhythm, normal heart sounds and intact distal pulses.  Pulmonary/Chest: Effort normal and breath sounds normal.  Abdominal: Soft. Bowel sounds are normal. He exhibits distension (obese).  Musculoskeletal: Normal range of motion.  Neurological: He is alert and oriented to person, place, and time. He has normal reflexes.  Skin: Skin is warm and dry.  Psychiatric: He has a normal mood and affect. His behavior is normal. Judgment and thought content normal.  Nursing note and vitals reviewed.   BP 114/66   Pulse 77   Temp 97.8 F (36.6 C) (Oral)   Ht '5\' 9"'  (1.753 m)   Wt (!) 308 lb (139.7 kg)   SpO2 98%   BMI 45.48 kg/m  Wt Readings from Last 3 Encounters:  03/17/19 (!) 308 lb (139.7 kg)  02/13/19 294 lb (133.4 kg)  01/19/19 285 lb 15 oz (129.7 kg)     Health Maintenance Due  Topic Date Due  . OPHTHALMOLOGY EXAM  11/02/1984     There are no preventive care reminders to display for this patient.  Lab Results  Component Value Date   TSH 1.874 01/19/2019   Lab Results  Component Value Date   WBC 5.9 03/17/2019   HGB 12.7 (L) 03/17/2019   HCT 41.1 03/17/2019   MCV 78 (L) 03/17/2019   PLT 304 03/17/2019   Lab Results  Component Value Date   NA 139 03/17/2019   K 4.6 03/17/2019   CO2 19 (L) 03/17/2019   GLUCOSE 152 (H) 03/17/2019   BUN 25 (H) 03/17/2019   CREATININE 1.77 (H) 03/17/2019   BILITOT 0.4 03/17/2019   ALKPHOS 74 03/17/2019   AST 22 03/17/2019   ALT 25 03/17/2019   PROT 7.3 03/17/2019   ALBUMIN 4.2 03/17/2019   CALCIUM 10.0 03/17/2019   ANIONGAP 9 02/20/2019   GFR 58.42 (L) 01/22/2015   Lab Results  Component Value Date   CHOL 125 03/17/2019   Lab Results  Component Value Date   HDL 34 (L) 03/17/2019   Lab Results  Component Value Date   LDLCALC 43 03/17/2019   Lab Results  Component Value Date   TRIG 240 (H) 03/17/2019   Lab Results  Component Value Date   CHOLHDL 3.7 03/17/2019   Lab Results  Component Value Date   HGBA1C 10.7 (A) 03/17/2019      Assessment & Plan:   1. Type 2 diabetes mellitus with stage 3 chronic kidney disease, with long-term current use of insulin (HCC) Improved at 10.7 today, from >15.5 on 01/22/2019.  He will continue to decrease foods/beverages high in sugars and carbs and follow Heart Healthy or DASH diet. Increase physical activity to at least 30 minutes cardio exercise daily.  - POCT glycosylated hemoglobin (Hb A1C) - POCT urinalysis dipstick - POCT glucose (manual entry) - CBC with Differential - Comprehensive metabolic panel - Lipid Panel  2. Hyperglycemia  3. Class 3 severe obesity due to excess calories with serious comorbidity and body mass index (BMI) of 50.0 to 59.9 in adult [facility]) Body mass index is 45.48 kg/m.  Goal BMI  is <30. Encouraged efforts to reduce weight include engaging in physical activity as tolerated with goal of  150 minutes per week. Improve dietary choices and eat a meal regimen consistent with a Mediterranean or DASH diet. Reduce simple carbohydrates. Do not skip meals and eat healthy snacks throughout the day to avoid over-eating at dinner. Set a goal weight loss that is achievable for you.  4. Follow up He will follow up in 3 months.   Current Outpatient Medications on File Prior to Visit  Medication Sig Dispense Refill  . allopurinol (ZYLOPRIM) 100 MG tablet Take 1 tablet (100 mg total) by mouth daily. 90 tablet 3  . amiodarone (PACERONE) 200 MG tablet Take 1 tablet (200 mg total) by mouth daily. 90 tablet 3  . apixaban (ELIQUIS) 5 MG TABS tablet Take 1 tablet (5 mg total) by mouth 2 (two) times daily. 60 tablet 10  . aspirin EC 81 MG tablet Take 81 mg by mouth daily.    . blood glucose meter kit and supplies Dispense based on patient and insurance preference. Use up to four times daily as directed. (FOR ICD-10 E10.9,  E11.9). 1 each 0  . carvedilol (COREG) 3.125 MG tablet Take 1 tablet (3.125 mg total) by mouth 2 (two) times daily. 60 tablet 2  . cetirizine (ZYRTEC) 10 MG tablet Take 1 tablet (10 mg total) by mouth daily. 30 tablet 6  . fluticasone (FLONASE) 50 MCG/ACT nasal spray Place 2 sprays into both nostrils daily. 16 g 6  . gabapentin (NEURONTIN) 300 MG capsule TAKE 1 CAPSULE BY MOUTH 3 TIMES DAILY. 90 capsule 3  . glipiZIDE (GLUCOTROL) 10 MG tablet Take 1 tablet (10 mg total) by mouth 2 (two) times daily before a meal. 60 tablet 3  . insulin aspart (NOVOLOG FLEXPEN) 100 UNIT/ML FlexPen Inject 0-15 Units into the skin 3 (three) times daily with meals. CBG 121-150: 2u; CBG 151-200: 3u; CBG 201-250: 5u; CBG 251-300: 8u; CBG 301-350: 11u; CBG 351-400:15 u; CBG>400: 15u 15 mL 1  . insulin NPH-regular Human (70-30) 100 UNIT/ML injection Inject 38 Units into the skin 2 (two) times daily with a meal. 60 mL 3  . Insulin Pen Needle 32G X 6 MM MISC Use with insulin sliding scale pen 100 each 1  .  isosorbide-hydrALAZINE (BIDIL) 20-37.5 MG tablet Take 1 tablet by mouth 3 (three) times daily. 90 tablet 5  . Magnesium Oxide 200 MG TABS Take 100 mg by mouth daily.    . meclizine (ANTIVERT) 25 MG tablet Take 1 tablet (25 mg total) by mouth 3 (three) times daily. 30 tablet 4  . metFORMIN (GLUCOPHAGE) 1000 MG tablet Take 1 tablet (1,000 mg total) by mouth 2 (two) times daily with a meal. 60 tablet 3  . MITIGARE 0.6 MG CAPS Take 0.6 mg by mouth as needed (Gout).   3  . ondansetron (ZOFRAN) 4 MG tablet Take 1 tablet (4 mg total) by mouth every 8 (eight) hours as needed for nausea or vomiting. 20 tablet 1  . sacubitril-valsartan (ENTRESTO) 97-103 MG Take 1 tablet by mouth 2 (two) times daily. 180 tablet 3  . spironolactone (ALDACTONE) 25 MG tablet Take 0.5 tablets (12.5 mg total) by mouth daily. 45 tablet 3  . torsemide (DEMADEX) 20 MG tablet Take 2 tablets (40 mg total) by mouth daily. 60 tablet 3  . Vitamin D, Ergocalciferol, (DRISDOL) 1.25 MG (50000 UT) CAPS capsule Take 1 capsule (50,000 Units total) by mouth every 30 (thirty) days. 5 capsule 5   No current facility-administered medications on file prior to visit.     Orders Placed This Encounter  Procedures  . CBC with Differential  . Comprehensive metabolic panel  . Lipid Panel  . POCT glycosylated hemoglobin (Hb A1C)  . POCT urinalysis dipstick  . POCT glucose (manual entry)    Referral Orders  No referral(s) requested today    Kathe Becton,  MSN, FNP-BC Patient [place], [place] [phone]   Problem List Items Addressed This Visit      Endocrine   Type 2 diabetes mellitus with stage 3 chronic kidney disease, with long-term current use of insulin ([place]) - Primary (Chronic)   Relevant Orders   POCT glycosylated hemoglobin (Hb A1C) (Completed)   POCT urinalysis dipstick (Completed)   POCT glucose (manual entry) (Completed)   CBC with Differential  (Completed)   Comprehensive metabolic panel (Completed)   Lipid Panel (Completed)     Other   Class 3 severe obesity due to excess calories with serious comorbidity and body mass index (BMI) of 50.0 to 59.9 in adult [facility])  Hyperglycemia    Other Visit Diagnoses    Follow up          No orders of the defined types were placed in this encounter.   Follow-up: Return in about 3 months (around 06/17/2019).    [name], FNP

## 2019-03-18 LAB — CBC WITH DIFFERENTIAL/PLATELET
Basophils Absolute: 0.1 10*3/uL (ref 0.0–0.2)
Basos: 1 %
EOS (ABSOLUTE): 0.2 10*3/uL (ref 0.0–0.4)
Eos: 4 %
Hematocrit: 41.1 % (ref 37.5–51.0)
Hemoglobin: 12.7 g/dL — ABNORMAL LOW (ref 13.0–17.7)
Immature Grans (Abs): 0 10*3/uL (ref 0.0–0.1)
Immature Granulocytes: 0 %
Lymphocytes Absolute: 2.5 10*3/uL (ref 0.7–3.1)
Lymphs: 42 %
MCH: 24.1 pg — ABNORMAL LOW (ref 26.6–33.0)
MCHC: 30.9 g/dL — ABNORMAL LOW (ref 31.5–35.7)
MCV: 78 fL — ABNORMAL LOW (ref 79–97)
Monocytes Absolute: 0.5 10*3/uL (ref 0.1–0.9)
Monocytes: 8 %
Neutrophils Absolute: 2.7 10*3/uL (ref 1.4–7.0)
Neutrophils: 45 %
Platelets: 304 10*3/uL (ref 150–450)
RBC: 5.28 x10E6/uL (ref 4.14–5.80)
RDW: 19.8 % — ABNORMAL HIGH (ref 11.6–15.4)
WBC: 5.9 10*3/uL (ref 3.4–10.8)

## 2019-03-18 LAB — COMPREHENSIVE METABOLIC PANEL
ALT: 25 IU/L (ref 0–44)
AST: 22 IU/L (ref 0–40)
Albumin/Globulin Ratio: 1.4 (ref 1.2–2.2)
Albumin: 4.2 g/dL (ref 4.0–5.0)
Alkaline Phosphatase: 74 IU/L (ref 39–117)
BUN/Creatinine Ratio: 14 (ref 9–20)
BUN: 25 mg/dL — ABNORMAL HIGH (ref 6–24)
Bilirubin Total: 0.4 mg/dL (ref 0.0–1.2)
CO2: 19 mmol/L — ABNORMAL LOW (ref 20–29)
Calcium: 10 mg/dL (ref 8.7–10.2)
Chloride: 106 mmol/L (ref 96–106)
Creatinine, Ser: 1.77 mg/dL — ABNORMAL HIGH (ref 0.76–1.27)
GFR calc Af Amer: 53 mL/min/{1.73_m2} — ABNORMAL LOW (ref >59)
GFR calc non Af Amer: 46 mL/min/{1.73_m2} — ABNORMAL LOW (ref >59)
Globulin, Total: 3.1 g/dL (ref 1.5–4.5)
Glucose: 152 mg/dL — ABNORMAL HIGH (ref 65–99)
Potassium: 4.6 mmol/L (ref 3.5–5.2)
Sodium: 139 mmol/L (ref 134–144)
Total Protein: 7.3 g/dL (ref 6.0–8.5)

## 2019-03-18 LAB — LIPID PANEL
Chol/HDL Ratio: 3.7 ratio (ref 0.0–5.0)
Cholesterol, Total: 125 mg/dL (ref 100–199)
HDL: 34 mg/dL — ABNORMAL LOW (ref >39)
LDL Calculated: 43 mg/dL (ref 0–99)
Triglycerides: 240 mg/dL — ABNORMAL HIGH (ref 0–149)
VLDL Cholesterol Cal: 48 mg/dL — ABNORMAL HIGH (ref 5–40)

## 2019-03-19 ENCOUNTER — Other Ambulatory Visit: Payer: Self-pay | Admitting: Family Medicine

## 2019-03-19 DIAGNOSIS — E785 Hyperlipidemia, unspecified: Secondary | ICD-10-CM

## 2019-03-19 MED ORDER — ATORVASTATIN CALCIUM 20 MG PO TABS: 20.0000 mg | ORAL_TABLET | Freq: Every day | ORAL | 3 refills | Status: DC

## 2019-03-20 MED FILL — ATORVASTATIN 20 MG TABLET: 20 | 30 days supply | Qty: 30 | Fill #0

## 2019-03-28 MED FILL — TORSEMIDE 20 MG TABLET: 20 | 30 days supply | Qty: 60 | Fill #3

## 2019-03-28 MED FILL — BIDIL TABLET: 20-37.5 | 30 days supply | Qty: 90 | Fill #3

## 2019-03-29 ENCOUNTER — Other Ambulatory Visit: Payer: Self-pay

## 2019-03-29 ENCOUNTER — Ambulatory Visit (HOSPITAL_COMMUNITY)
Admission: RE | Admit: 2019-03-29 | Discharge: 2019-03-29 | Disposition: A | Discharge status: Home / Self Care | Payer: Medicaid Other | Source: Ambulatory Visit | Attending: Cardiology | Admitting: Cardiology

## 2019-03-29 VITALS — BP 140/80 | HR 61 | Wt 313.2 lb

## 2019-03-29 DIAGNOSIS — Z7951 Long term (current) use of inhaled steroids: Secondary | ICD-10-CM | POA: Insufficient documentation

## 2019-03-29 DIAGNOSIS — E1122 Type 2 diabetes mellitus with diabetic chronic kidney disease: Secondary | ICD-10-CM | POA: Diagnosis not present

## 2019-03-29 DIAGNOSIS — Z91030 Bee allergy status: Secondary | ICD-10-CM | POA: Diagnosis not present

## 2019-03-29 DIAGNOSIS — G4733 Obstructive sleep apnea (adult) (pediatric): Secondary | ICD-10-CM | POA: Insufficient documentation

## 2019-03-29 DIAGNOSIS — Z886 Allergy status to analgesic agent status: Secondary | ICD-10-CM | POA: Diagnosis not present

## 2019-03-29 DIAGNOSIS — Z905 Acquired absence of kidney: Secondary | ICD-10-CM | POA: Diagnosis not present

## 2019-03-29 DIAGNOSIS — Z87891 Personal history of nicotine dependence: Secondary | ICD-10-CM | POA: Diagnosis not present

## 2019-03-29 DIAGNOSIS — N183 Chronic kidney disease, stage 3 (moderate): Secondary | ICD-10-CM | POA: Insufficient documentation

## 2019-03-29 DIAGNOSIS — I480 Paroxysmal atrial fibrillation: Secondary | ICD-10-CM | POA: Diagnosis not present

## 2019-03-29 DIAGNOSIS — Z794 Long term (current) use of insulin: Secondary | ICD-10-CM | POA: Insufficient documentation

## 2019-03-29 DIAGNOSIS — Z79899 Other long term (current) drug therapy: Secondary | ICD-10-CM | POA: Diagnosis not present

## 2019-03-29 DIAGNOSIS — Z7982 Long term (current) use of aspirin: Secondary | ICD-10-CM | POA: Diagnosis not present

## 2019-03-29 DIAGNOSIS — M109 Gout, unspecified: Secondary | ICD-10-CM | POA: Insufficient documentation

## 2019-03-29 DIAGNOSIS — I472 Ventricular tachycardia: Secondary | ICD-10-CM | POA: Insufficient documentation

## 2019-03-29 DIAGNOSIS — Z7901 Long term (current) use of anticoagulants: Secondary | ICD-10-CM | POA: Diagnosis not present

## 2019-03-29 DIAGNOSIS — Z85528 Personal history of other malignant neoplasm of kidney: Secondary | ICD-10-CM | POA: Insufficient documentation

## 2019-03-29 DIAGNOSIS — I10 Essential (primary) hypertension: Secondary | ICD-10-CM

## 2019-03-29 DIAGNOSIS — Z91018 Allergy to other foods: Secondary | ICD-10-CM | POA: Insufficient documentation

## 2019-03-29 DIAGNOSIS — I428 Other cardiomyopathies: Secondary | ICD-10-CM | POA: Insufficient documentation

## 2019-03-29 DIAGNOSIS — Z6841 Body Mass Index (BMI) 40.0 and over, adult: Secondary | ICD-10-CM | POA: Insufficient documentation

## 2019-03-29 DIAGNOSIS — I130 Hypertensive heart and chronic kidney disease with heart failure and stage 1 through stage 4 chronic kidney disease, or unspecified chronic kidney disease: Secondary | ICD-10-CM | POA: Diagnosis not present

## 2019-03-29 DIAGNOSIS — I5022 Chronic systolic (congestive) heart failure: Secondary | ICD-10-CM

## 2019-03-29 DIAGNOSIS — C649 Malignant neoplasm of unspecified kidney, except renal pelvis: Secondary | ICD-10-CM | POA: Diagnosis not present

## 2019-03-29 DIAGNOSIS — E7800 Pure hypercholesterolemia, unspecified: Secondary | ICD-10-CM | POA: Insufficient documentation

## 2019-03-29 DIAGNOSIS — Z888 Allergy status to other drugs, medicaments and biological substances status: Secondary | ICD-10-CM | POA: Diagnosis not present

## 2019-03-29 NOTE — Patient Instructions (Signed)
Continue current meds.  Please contact our office in October to schedule and appointment for December.  At the [name], you and your health needs are our priority. As part of our continuing mission to provide you with exceptional heart care, we have created designated Provider Care Teams. These Care Teams include your primary Cardiologist (physician) and Advanced Practice Providers (APPs- Physician Assistants and Nurse Practitioners) who all work together to provide you with the care you need, when you need it.   You may see any of the following providers on your designated Care Team at your next follow up: [name] Dr Glori Bickers . Dr Loralie Champagne . Darrick Grinder, NP

## 2019-03-29 NOTE — Progress Notes (Signed)
Patient ID: Jeffrey [name], male   DOB: [birth year], 43 y.o.   MRN: [id number]      Advanced Heart Failure Clinic Note   Patient Name: Jeffrey [name] Date of Encounter: 03/29/2019  Primary Care Provider:  [name], FNP Primary Cardiologist:  Lizbeth Bark, MD  Primary HF Cardiologist: Bensimhon  History: Jeffrey [name] is a 43 y.o. male with a history of  poorly controlled hypertension, R renal cell carcinoma s/p nephrectomy, DM2, OSA, gout, morbid obesity and systolic HF due tononischemic myopathy.    Admitted to [facility] in 3/16 secondary to atypical chest pain, dyspnea, and mild troponin elevation. Echo during hospitalization shows "severely reduced LV function" - EF not quantified due to poor windows. He was diuresed and was down about 7 pounds at discharge, at 289 on the hospital scale.   Admitted 05/14/15 with CP at rest. CXR looked improved from previous. CP resolved spontaneously after hospitalization and troponin was flat. R/LHC showed normal coronaries and moderately elevated filling pressures with moderately depressed cardiac output. [place] subcutaneous ICD was implanted 05/17/15.    Admitted 9/4 - 06/13/34 with A/C systolic CHF in the setting of new afib. Started on IV lasix and amiodarone. Pt diuresed and converted to NSR without requiring DCCV. Started on Eliquis for [facility]. Continued on po amio on discharge with wean over several weeks.  Admitted 3/27 - 01/01/18 with ICD shock. Electrolytes stable. Low grade fever and volume overload noted. Diuresed 7 lbs and treated with course of ABX. Echo 12/31/17 with LVEF 25-30%.   He was admitted early November 2019 with PNA. He was treated with IV abx. No changes were made to HF medications. He was also treated for strep throat at the end of November.   Today he returns for HF follow up. Overall feeling. Able to walk 1/2 mile. Denies SOB/PND/Orthopnea. Appetite ok. No fever or chills. Weight at home 310  pounds. Taking all medications.    Echo 12/31/17 LVEF 25-30%, Moderate MR, mild RV dilation, Severe RAE, Mod TR, Pa peak pressure 67 mm Hg.  ECHO 01/16/2016 EF 20-25%  Grade III DD Echo 05/07/15.  EF 20-25%, grade II diastolic dysfunction, diffuse hypokinesis, moderate LV dilation, mild LVH, normal RV size with mildly decreased systolic function.   Echo in 2012 with EF 20-25%.  In 1/13 had nuclear study EF 45-50% suggestive of NICM.   [facility] 05/17/15 RA = 7 RV = 42/3/7 PA = 43/30 (39) PCW = 26 Fick cardiac output/index = 4.9/2.1 PVR = 2.7 WU SVR = 1651 FA sat = 95% PA sat = 65%, 65% Ao = 122/97 (109) LV = 110/31/33 1) Normal coronaries 2) Moderately elevated filling pressures with moderately depressed cardiac output  Review of systems complete and found to be negative unless listed in HPI.    Past Medical History:  Diagnosis Date  . Anxiety   . Aspirin allergy   . Childhood asthma   . Chronic systolic CHF (congestive heart failure) ([place])    a. EF 20-25% in 2012. b. EF 45-50% in 10/2011 with nonischemic nuc - presumed NICM. c. 12/2014 Echo: Sev depressed LV fxn, sev dil LV, mild LVH, mild MR, sev dil LA, mildly reduced RV fxn.  . CKD (chronic kidney disease) stage 2, GFR 60-89 ml/min   . High cholesterol   . Hypertension   . Morbid obesity ([place])   . Nephrolithiasis   . OSA on CPAP   . Presumed NICM    a. 04/2014 Myoview: EF  26%, glob HK, sev glob HK, ? prior infarct;  b. Never cathed 2/2 CKD.  [name] Renal cell carcinoma ([place])    a. s/p Rt robotic assisted partial converted to radical nephrectomy on 01/2013.  . Troponin level elevated    a. 04/2014, 12/2014: felt due to CHF.  . Type II diabetes mellitus ([place])   . Ventricular tachycardia ([place])    a. appropriate ICD therapy 12/2017   Past Surgical History:  Procedure Laterality Date  . APPENDECTOMY  07/2004  . CARDIAC CATHETERIZATION N/A 05/17/2015   Procedure: Right/Left Heart Cath and Coronary Angiography;  Surgeon: Jolaine Artist, MD;  Location: [place] [facility];   Service: Cardiovascular;  Laterality: N/A;  . EP IMPLANTABLE DEVICE N/A 05/17/2015   Procedure: SubQ ICD Implant;  Surgeon: Will Meredith Leeds, MD;  Location: [place] [facility];  Service: Cardiovascular;  Laterality: N/A;  . ROBOTIC ASSISTED LAPAROSCOPIC LYSIS OF ADHESION  01/13/2013   Procedure: ROBOTIC ASSISTED LAPAROSCOPIC LYSIS OF ADHESION EXTENSIVE;  Surgeon: Alexis Frock, MD;  Location: [facility];  Service: Urology;;  . ROBOTIC ASSITED PARTIAL NEPHRECTOMY Right 01/13/2013   Procedure: ROBOTIC ASSITED PARTIAL NEPHRECTOMY CONVERTED TO ROBOTIC ASSISTED RIGHT RADICAL NEPHRECTOMY;  Surgeon: Alexis Frock, MD;  Location: [facility];  Service: Urology;  Laterality: Right;    Allergies  Allergies  Allergen Reactions  . Aspirin Shortness Of Breath, Itching and Rash     Burning sensation (Patient reports he tolerates other NSAIDS)   . Bee Venom Hives and Swelling  . Lisinopril Cough  . Tomato Rash     Home Medications Current Outpatient Medications  Medication Sig Dispense Refill  . allopurinol (ZYLOPRIM) 100 MG tablet Take 1 tablet (100 mg total) by mouth daily. 90 tablet 3  . amiodarone (PACERONE) 200 MG tablet Take 1 tablet (200 mg total) by mouth daily. 90 tablet 3  . apixaban (ELIQUIS) 5 MG TABS tablet Take 1 tablet (5 mg total) by mouth 2 (two) times daily. 60 tablet 10  . aspirin EC 81 MG tablet Take 81 mg by mouth daily.    . blood glucose meter kit and supplies Dispense based on patient and insurance preference. Use up to four times daily as directed. (FOR ICD-10 E10.9, E11.9). 1 each 0  . carvedilol (COREG) 3.125 MG tablet Take 1 tablet (3.125 mg total) by mouth 2 (two) times daily. 60 tablet 2  . cetirizine (ZYRTEC) 10 MG tablet Take 1 tablet (10 mg total) by mouth daily. 30 tablet 6  . fluticasone (FLONASE) 50 MCG/ACT nasal spray Place 2 sprays into both nostrils daily. 16 g 6  . gabapentin (NEURONTIN) 300 MG capsule TAKE 1 CAPSULE BY MOUTH 3 TIMES DAILY. 90 capsule 3  .  glipiZIDE (GLUCOTROL) 10 MG tablet Take 1 tablet (10 mg total) by mouth 2 (two) times daily before a meal. 60 tablet 3  . insulin aspart (NOVOLOG FLEXPEN) 100 UNIT/ML FlexPen Inject 0-15 Units into the skin 3 (three) times daily with meals. CBG 121-150: 2u; CBG 151-200: 3u; CBG 201-250: 5u; CBG 251-300: 8u; CBG 301-350: 11u; CBG 351-400:15 u; CBG>400: 15u 15 mL 1  . insulin NPH-regular Human (70-30) 100 UNIT/ML injection Inject 38 Units into the skin 2 (two) times daily with a meal. 60 mL 3  . Insulin Pen Needle 32G X 6 MM MISC Use with insulin sliding scale pen 100 each 1  . isosorbide-hydrALAZINE (BIDIL) 20-37.5 MG tablet Take 1 tablet by mouth 3 (three) times daily. 90 tablet 5  . Magnesium Oxide  200 MG TABS Take 100 mg by mouth daily.    . meclizine (ANTIVERT) 25 MG tablet Take 1 tablet (25 mg total) by mouth 3 (three) times daily. 30 tablet 4  . metFORMIN (GLUCOPHAGE) 1000 MG tablet Take 1 tablet (1,000 mg total) by mouth 2 (two) times daily with a meal. 60 tablet 3  . MITIGARE 0.6 MG CAPS Take 0.6 mg by mouth as needed (Gout).   3  . ondansetron (ZOFRAN) 4 MG tablet Take 1 tablet (4 mg total) by mouth every 8 (eight) hours as needed for nausea or vomiting. 20 tablet 1  . sacubitril-valsartan (ENTRESTO) 97-103 MG Take 1 tablet by mouth 2 (two) times daily. 180 tablet 3  . spironolactone (ALDACTONE) 25 MG tablet Take 0.5 tablets (12.5 mg total) by mouth daily. 45 tablet 3  . torsemide (DEMADEX) 20 MG tablet Take 2 tablets (40 mg total) by mouth daily. 60 tablet 3  . Vitamin D, Ergocalciferol, (DRISDOL) 1.25 MG (50000 UT) CAPS capsule Take 1 capsule (50,000 Units total) by mouth every 30 (thirty) days. 5 capsule 5   No current facility-administered medications for this encounter.    Vitals:   03/29/19 1440  BP: 140/80  Pulse: 61  SpO2: 97%  Weight: (!) 142.1 kg (313 lb 4 oz)   Wt Readings from Last 3 Encounters:  03/29/19 (!) 142.1 kg (313 lb 4 oz)  03/17/19 (!) 139.7 kg (308 lb)   02/13/19 133.4 kg (294 lb)   Physical Exam General:  Well appearing. No resp difficulty HEENT: normal Neck: supple. no JVD. Carotids 2+ bilat; no bruits. No lymphadenopathy or thryomegaly appreciated. Cor: PMI nondisplaced. Regular rate & rhythm. No rubs, gallops or murmurs. Lungs: clear Abdomen: obese, soft, nontender, nondistended. No hepatosplenomegaly. No bruits or masses. Good bowel sounds. Extremities: no cyanosis, clubbing, rash, edema Neuro: alert & orientedx3, cranial nerves grossly intact. moves all 4 extremities w/o difficulty. Affect pleasant   Assessment & Plan 1.  Chronic systolic congestive heart failure: s/p [employer] subcutaneous ICD implant 05/17/15.  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  - Echo 12/31/17 LVEF 25-30%, Moderate MR, mild RV dilation, Severe RAE, Mod TR, Pa peak pressure 67 mm Hg.  NYHA II. Volume status stable despite weight gain.  - Continue torsemide 40 mg daily  - Continue Entresto 97/103 mg BID. Watch closely with solitary kidney.  - Continue bidil 1 tab TID - Continue carvedilol 3.125 bid.  - Continue Imdur 30 mg daily.  2. Paroxysmal Afib - New diagnosis 06/2017.  - Continue Eliquis 5 mg BID.  - Continue amiodarone 200 daily. Check TFTs and LFTs today.  - Will need yearly eye exams. Regular on exam today.  3. VT with ICD shock 12/29/17 - Continue amiodarone 200 mg daily. No syncope or palpitations. 4. Hypertension:  - Stable.  5. CKD stage III -creatinine 1.7.  6. OSA:  - Back on CPAP.  7. Tobacco use: - Former smoker. No change.  8. Gout:  - Continue allopurinol 200 daily for prophylaxis. No change.  9. DM2:  - Stable. Per PCP.  10.Renal Cell Carcinoma - S/P nephrectomy 2014.   Follow closely in setting of solitary kidney.  - Follows at [facility] and has follow up next month.  11. Morbid Obesity - Body mass index is 46.26 kg/m.  Discussed weight loss.   Follow up 6 months   Darrick Grinder, NP  2:58 PM

## 2019-04-04 MED FILL — GABAPENTIN 300 MG CAPSULE: 300 | 30 days supply | Qty: 90 | Fill #3

## 2019-04-05 DIAGNOSIS — G4733 Obstructive sleep apnea (adult) (pediatric): Secondary | ICD-10-CM | POA: Diagnosis not present

## 2019-04-10 ENCOUNTER — Ambulatory Visit (INDEPENDENT_AMBULATORY_CARE_PROVIDER_SITE_OTHER): Payer: Medicaid Other | Admitting: *Deleted

## 2019-04-10 DIAGNOSIS — I472 Ventricular tachycardia, unspecified: Secondary | ICD-10-CM

## 2019-04-10 LAB — CUP PACEART REMOTE DEVICE CHECK
Battery Remaining Percentage: 56 %
Date Time Interrogation Session: 20200706134100
Implantable Lead Implant Date: 20160812
Implantable Lead Location: 753862
Implantable Lead Model: 3401
Implantable Pulse Generator Implant Date: 20160812
Pulse Gen Serial Number: 117162

## 2019-04-12 DIAGNOSIS — E669 Obesity, unspecified: Secondary | ICD-10-CM | POA: Diagnosis not present

## 2019-04-12 DIAGNOSIS — E559 Vitamin D deficiency, unspecified: Secondary | ICD-10-CM | POA: Diagnosis not present

## 2019-04-12 DIAGNOSIS — N183 Chronic kidney disease, stage 3 (moderate): Secondary | ICD-10-CM | POA: Diagnosis not present

## 2019-04-12 DIAGNOSIS — I129 Hypertensive chronic kidney disease with stage 1 through stage 4 chronic kidney disease, or unspecified chronic kidney disease: Secondary | ICD-10-CM | POA: Diagnosis not present

## 2019-04-12 DIAGNOSIS — M109 Gout, unspecified: Secondary | ICD-10-CM | POA: Diagnosis not present

## 2019-04-12 DIAGNOSIS — Z905 Acquired absence of kidney: Secondary | ICD-10-CM | POA: Diagnosis not present

## 2019-04-12 DIAGNOSIS — I509 Heart failure, unspecified: Secondary | ICD-10-CM | POA: Diagnosis not present

## 2019-04-16 ENCOUNTER — Encounter: Payer: Self-pay | Admitting: Cardiology

## 2019-04-16 NOTE — Progress Notes (Signed)
Remote ICD transmission.   

## 2019-04-18 ENCOUNTER — Telehealth (HOSPITAL_COMMUNITY): Payer: Self-pay | Admitting: Cardiology

## 2019-04-18 NOTE — Telephone Encounter (Signed)
[employer] disability claims representative called to request most recent OV, reports request has gone unanswered x 4 weeks  Most recent office visit faxed via epic routing to [phone]

## 2019-05-06 DIAGNOSIS — G4733 Obstructive sleep apnea (adult) (pediatric): Secondary | ICD-10-CM | POA: Diagnosis not present

## 2019-05-09 ENCOUNTER — Other Ambulatory Visit: Payer: Self-pay | Admitting: Family Medicine

## 2019-05-09 DIAGNOSIS — I5022 Chronic systolic (congestive) heart failure: Secondary | ICD-10-CM

## 2019-05-09 MED FILL — ELIQUIS 5 MG TABLET: 5 | 30 days supply | Qty: 60 | Fill #4

## 2019-05-09 MED FILL — ALLOPURINOL 100 MG TABLET: 100 | 30 days supply | Qty: 60 | Fill #4

## 2019-05-09 MED FILL — CETIRIZINE HCL 10 MG TABS: 10 | 30 days supply | Qty: 30 | Fill #3

## 2019-05-09 MED FILL — ATORVASTATIN 20 MG TABLET: 20 | 30 days supply | Qty: 30 | Fill #1

## 2019-05-09 MED FILL — ENTRESTO 97 MG-103 MG TAB: 97-103 | 30 days supply | Qty: 60 | Fill #4

## 2019-05-09 MED FILL — CARVEDILOL 3.125 MG TABLET: 3.125 | 30 days supply | Qty: 60 | Fill #3

## 2019-05-09 MED FILL — MITIGARE 0.6 MG CAPSULE: 0.6 | 30 days supply | Qty: 60 | Fill #0

## 2019-05-09 MED FILL — SPIRONOLACTONE 25 MG TABLET: 25 | 60 days supply | Qty: 30 | Fill #1

## 2019-05-09 MED FILL — MECLIZINE 25 MG TABLET: 25 | 10 days supply | Qty: 30 | Fill #4

## 2019-05-09 MED FILL — TORSEMIDE 20 MG TABLET: 20 | 30 days supply | Qty: 60 | Fill #0

## 2019-05-09 MED FILL — BIDIL TABLET: 20-37.5 | 30 days supply | Qty: 90 | Fill #4

## 2019-05-09 MED FILL — GABAPENTIN 300 MG CAPSULE: 300 | 30 days supply | Qty: 90 | Fill #0

## 2019-05-10 ENCOUNTER — Other Ambulatory Visit: Payer: Self-pay | Admitting: Family Medicine

## 2019-05-10 DIAGNOSIS — E1122 Type 2 diabetes mellitus with diabetic chronic kidney disease: Secondary | ICD-10-CM

## 2019-05-10 DIAGNOSIS — N183 Chronic kidney disease, stage 3 unspecified: Secondary | ICD-10-CM

## 2019-05-10 MED ORDER — TRUEPLUS 5-BEVEL PEN NEEDLES 31G X 6 MM MISC: 1.0000 | 12 refills | Status: DC | PRN

## 2019-06-06 DIAGNOSIS — G4733 Obstructive sleep apnea (adult) (pediatric): Secondary | ICD-10-CM | POA: Diagnosis not present

## 2019-06-09 ENCOUNTER — Other Ambulatory Visit: Payer: Self-pay | Admitting: Pharmacist

## 2019-06-09 MED ORDER — "INSULIN SYRINGE-NEEDLE U-100 31G X 5/16"" 0.5 ML MISC": 11 refills | Status: DC

## 2019-06-09 MED FILL — TRUEPLUS 5-BEVEL PEN NEEDLE: 31G X 5 MM | 25 days supply | Qty: 100 | Fill #0

## 2019-06-09 MED FILL — TRUEPLUS SYR 0.5ML 31GX5/16: 31G X 5/16" | 30 days supply | Qty: 100 | Fill #0

## 2019-06-14 DIAGNOSIS — Z736 Limitation of activities due to disability: Secondary | ICD-10-CM

## 2019-06-19 ENCOUNTER — Ambulatory Visit (INDEPENDENT_AMBULATORY_CARE_PROVIDER_SITE_OTHER): Payer: Medicaid Other | Admitting: Family Medicine

## 2019-06-19 ENCOUNTER — Encounter: Payer: Self-pay | Admitting: Family Medicine

## 2019-06-19 ENCOUNTER — Other Ambulatory Visit: Payer: Self-pay

## 2019-06-19 VITALS — BP 121/78 | HR 83 | Temp 98.7°F | Ht 69.0 in | Wt 314.6 lb

## 2019-06-19 DIAGNOSIS — Z3009 Encounter for other general counseling and advice on contraception: Secondary | ICD-10-CM | POA: Diagnosis not present

## 2019-06-19 DIAGNOSIS — Z2821 Immunization not carried out because of patient refusal: Secondary | ICD-10-CM

## 2019-06-19 DIAGNOSIS — H538 Other visual disturbances: Secondary | ICD-10-CM | POA: Diagnosis not present

## 2019-06-19 DIAGNOSIS — H547 Unspecified visual loss: Secondary | ICD-10-CM

## 2019-06-19 DIAGNOSIS — Z09 Encounter for follow-up examination after completed treatment for conditions other than malignant neoplasm: Secondary | ICD-10-CM | POA: Diagnosis not present

## 2019-06-19 DIAGNOSIS — N183 Chronic kidney disease, stage 3 unspecified: Secondary | ICD-10-CM

## 2019-06-19 DIAGNOSIS — E1122 Type 2 diabetes mellitus with diabetic chronic kidney disease: Secondary | ICD-10-CM

## 2019-06-19 DIAGNOSIS — Z6841 Body Mass Index (BMI) 40.0 and over, adult: Secondary | ICD-10-CM

## 2019-06-19 DIAGNOSIS — E66813 Obesity, class 3: Secondary | ICD-10-CM

## 2019-06-19 DIAGNOSIS — Z794 Long term (current) use of insulin: Secondary | ICD-10-CM

## 2019-06-19 LAB — POCT URINALYSIS DIPSTICK
Bilirubin, UA: NEGATIVE
Blood, UA: NEGATIVE
Glucose, UA: POSITIVE — AB
Ketones, UA: NEGATIVE
Leukocytes, UA: NEGATIVE
Nitrite, UA: NEGATIVE
Protein, UA: NEGATIVE
Spec Grav, UA: 1.02 (ref 1.010–1.025)
Urobilinogen, UA: 0.2 E.U./dL
pH, UA: 5.5 (ref 5.0–8.0)

## 2019-06-19 LAB — POCT GLUCOSE (DEVICE FOR HOME USE)
Glucose Fasting, POC: 312 mg/dL — AB (ref 70–99)
POC Glucose: 312 mg/dl — AB (ref 70–99)

## 2019-06-19 LAB — POCT GLYCOSYLATED HEMOGLOBIN (HGB A1C)
HbA1c POC (<> result, manual entry): 6.8 % (ref 4.0–5.6)
HbA1c, POC (controlled diabetic range): 6.8 % (ref 0.0–7.0)
HbA1c, POC (prediabetic range): 6.8 % — AB (ref 5.7–6.4)
Hemoglobin A1C: 6.8 % — AB (ref 4.0–5.6)

## 2019-06-19 NOTE — Patient Instructions (Signed)
Calorie Counting for Weight Loss Calories are units of energy. Your body needs a certain amount of calories from food to keep you going throughout the day. When you eat more calories than your body needs, your body stores the extra calories as fat. When you eat fewer calories than your body needs, your body burns fat to get the energy it needs. Calorie counting means keeping track of how many calories you eat and drink each day. Calorie counting can be helpful if you need to lose weight. If you make sure to eat fewer calories than your body needs, you should lose weight. Ask your health care provider what a healthy weight is for you. For calorie counting to work, you will need to eat the right number of calories in a day in order to lose a healthy amount of weight per week. A dietitian can help you determine how many calories you need in a day and will give you suggestions on how to reach your calorie goal.  A healthy amount of weight to lose per week is usually 1-2 lb (0.5-0.9 kg). This usually means that your daily calorie intake should be reduced by 500-750 calories.  Eating 1,200 - 1,500 calories per day can help most women lose weight.  Eating 1,500 - 1,800 calories per day can help most men lose weight. What is my plan? My goal is to have __________ calories per day. If I have this many calories per day, I should lose around __________ pounds per week. What do I need to know about calorie counting? In order to meet your daily calorie goal, you will need to:  Find out how many calories are in each food you would like to eat. Try to do this before you eat.  Decide how much of the food you plan to eat.  Write down what you ate and how many calories it had. Doing this is called keeping a food log. To successfully lose weight, it is important to balance calorie counting with a healthy lifestyle that includes regular activity. Aim for 150 minutes of moderate exercise (such as walking) or 75  minutes of vigorous exercise (such as running) each week. Where do I find calorie information?  The number of calories in a food can be found on a Nutrition Facts label. If a food does not have a Nutrition Facts label, try to look up the calories online or ask your dietitian for help. Remember that calories are listed per serving. If you choose to have more than one serving of a food, you will have to multiply the calories per serving by the amount of servings you plan to eat. For example, the label on a package of bread might say that a serving size is 1 slice and that there are 90 calories in a serving. If you eat 1 slice, you will have eaten 90 calories. If you eat 2 slices, you will have eaten 180 calories. How do I keep a food log? Immediately after each meal, record the following information in your food log:  What you ate. Don't forget to include toppings, sauces, and other extras on the food.  How much you ate. This can be measured in cups, ounces, or number of items.  How many calories each food and drink had.  The total number of calories in the meal. Keep your food log near you, such as in a small notebook in your pocket, or use a mobile app or website. Some programs will calculate   calories for you and show you how many calories you have left for the day to meet your goal. What are some calorie counting tips?   Use your calories on foods and drinks that will fill you up and not leave you hungry: ? Some examples of foods that fill you up are nuts and nut butters, vegetables, lean proteins, and high-fiber foods like whole grains. High-fiber foods are foods with more than 5 g fiber per serving. ? Drinks such as sodas, specialty coffee drinks, alcohol, and juices have a lot of calories, yet do not fill you up.  Eat nutritious foods and avoid empty calories. Empty calories are calories you get from foods or beverages that do not have many vitamins or protein, such as candy, sweets, and  soda. It is better to have a nutritious high-calorie food (such as an avocado) than a food with few nutrients (such as a bag of chips).  Know how many calories are in the foods you eat most often. This will help you calculate calorie counts faster.  Pay attention to calories in drinks. Low-calorie drinks include water and unsweetened drinks.  Pay attention to nutrition labels for "low fat" or "fat free" foods. These foods sometimes have the same amount of calories or more calories than the full fat versions. They also often have added sugar, starch, or salt, to make up for flavor that was removed with the fat.  Find a way of tracking calories that works for you. Get creative. Try different apps or programs if writing down calories does not work for you. What are some portion control tips?  Know how many calories are in a serving. This will help you know how many servings of a certain food you can have.  Use a measuring cup to measure serving sizes. You could also try weighing out portions on a kitchen scale. With time, you will be able to estimate serving sizes for some foods.  Take some time to put servings of different foods on your favorite plates, bowls, and cups so you know what a serving looks like.  Try not to eat straight from a bag or box. Doing this can lead to overeating. Put the amount you would like to eat in a cup or on a plate to make sure you are eating the right portion.  Use smaller plates, glasses, and bowls to prevent overeating.  Try not to multitask (for example, watch TV or use your computer) while eating. If it is time to eat, sit down at a table and enjoy your food. This will help you to know when you are full. It will also help you to be aware of what you are eating and how much you are eating. What are tips for following this plan? Reading food labels  Check the calorie count compared to the serving size. The serving size may be smaller than what you are used to  eating.  Check the source of the calories. Make sure the food you are eating is high in vitamins and protein and low in saturated and trans fats. Shopping  Read nutrition labels while you shop. This will help you make healthy decisions before you decide to purchase your food.  Make a grocery list and stick to it. Cooking  Try to cook your favorite foods in a healthier way. For example, try baking instead of frying.  Use low-fat dairy products. Meal planning  Use more fruits and vegetables. Half of your plate should be fruits   and vegetables.  Include lean proteins like poultry and fish. How do I count calories when eating out?  Ask for smaller portion sizes.  Consider sharing an entree and sides instead of getting your own entree.  If you get your own entree, eat only half. Ask for a box at the beginning of your meal and put the rest of your entree in it so you are not tempted to eat it.  If calories are listed on the menu, choose the lower calorie options.  Choose dishes that include vegetables, fruits, whole grains, low-fat dairy products, and lean protein.  Choose items that are boiled, broiled, grilled, or steamed. Stay away from items that are buttered, battered, fried, or served with cream sauce. Items labeled "crispy" are usually fried, unless stated otherwise.  Choose water, low-fat milk, unsweetened iced tea, or other drinks without added sugar. If you want an alcoholic beverage, choose a lower calorie option such as a glass of wine or light beer.  Ask for dressings, sauces, and syrups on the side. These are usually high in calories, so you should limit the amount you eat.  If you want a salad, choose a garden salad and ask for grilled meats. Avoid extra toppings like bacon, cheese, or fried items. Ask for the dressing on the side, or ask for olive oil and vinegar or lemon to use as dressing.  Estimate how many servings of a food you are given. For example, a serving of  cooked rice is  cup or about the size of half a baseball. Knowing serving sizes will help you be aware of how much food you are eating at restaurants. The list below tells you how big or small some common portion sizes are based on everyday objects: ? 1 oz-4 stacked dice. ? 3 oz-1 deck of cards. ? 1 tsp-1 die. ? 1 Tbsp- a ping-pong ball. ? 2 Tbsp-1 ping-pong ball. ?  cup- baseball. ? 1 cup-1 baseball. Summary  Calorie counting means keeping track of how many calories you eat and drink each day. If you eat fewer calories than your body needs, you should lose weight.  A healthy amount of weight to lose per week is usually 1-2 lb (0.5-0.9 kg). This usually means reducing your daily calorie intake by 500-750 calories.  The number of calories in a food can be found on a Nutrition Facts label. If a food does not have a Nutrition Facts label, try to look up the calories online or ask your dietitian for help.  Use your calories on foods and drinks that will fill you up, and not on foods and drinks that will leave you hungry.  Use smaller plates, glasses, and bowls to prevent overeating. This information is not intended to replace advice given to you by your health care provider. Make sure you discuss any questions you have with your health care provider. Document Released: 09/21/2005 Document Revised: 06/10/2018 Document Reviewed: 08/21/2016 [employer] Patient Education  2020 [employer]  Exercising to Lose Weight Exercise is structured, repetitive physical activity to improve fitness and health. Getting regular exercise is important for everyone. It is especially important if you are overweight. Being overweight increases your risk of heart disease, stroke, diabetes, high blood pressure, and several types of cancer. Reducing your calorie intake and exercising can help you lose weight. Exercise is usually categorized as moderate or vigorous intensity. To lose weight, most people need to do a  certain amount of moderate-intensity or vigorous-intensity exercise each week. Moderate-intensity   exercise  Moderate-intensity exercise is any activity that gets you moving enough to burn at least three times more energy (calories) than if you were sitting. Examples of moderate exercise include:  Walking a mile in 15 minutes.  Doing light yard work.  Biking at an easy pace. Most people should get at least 150 minutes (2 hours and 30 minutes) a week of moderate-intensity exercise to maintain their body weight. Vigorous-intensity exercise Vigorous-intensity exercise is any activity that gets you moving enough to burn at least six times more calories than if you were sitting. When you exercise at this intensity, you should be working hard enough that you are not able to carry on a conversation. Examples of vigorous exercise include:  Running.  Playing a team sport, such as football, basketball, and soccer.  Jumping rope. Most people should get at least 75 minutes (1 hour and 15 minutes) a week of vigorous-intensity exercise to maintain their body weight. How can exercise affect me? When you exercise enough to burn more calories than you eat, you lose weight. Exercise also reduces body fat and builds muscle. The more muscle you have, the more calories you burn. Exercise also:  Improves mood.  Reduces stress and tension.  Improves your overall fitness, flexibility, and endurance.  Increases bone strength. The amount of exercise you need to lose weight depends on:  Your age.  The type of exercise.  Any health conditions you have.  Your overall physical ability. Talk to your health care provider about how much exercise you need and what types of activities are safe for you. What actions can I take to lose weight? Nutrition   Make changes to your diet as told by your health care provider or diet and nutrition specialist (dietitian). This may include: ? Eating fewer calories. ?  Eating more protein. ? Eating less unhealthy fats. ? Eating a diet that includes fresh fruits and vegetables, whole grains, low-fat dairy products, and lean protein. ? Avoiding foods with added fat, salt, and sugar.  Drink plenty of water while you exercise to prevent dehydration or heat stroke. Activity  Choose an activity that you enjoy and set realistic goals. Your health care provider can help you make an exercise plan that works for you.  Exercise at a moderate or vigorous intensity most days of the week. ? The intensity of exercise may vary from person to person. You can tell how intense a workout is for you by paying attention to your breathing and heartbeat. Most people will notice their breathing and heartbeat get faster with more intense exercise.  Do resistance training twice each week, such as: ? Push-ups. ? Sit-ups. ? Lifting weights. ? Using resistance bands.  Getting short amounts of exercise can be just as helpful as long structured periods of exercise. If you have trouble finding time to exercise, try to include exercise in your daily routine. ? Get up, stretch, and walk around every 30 minutes throughout the day. ? Go for a walk during your lunch break. ? Park your car farther away from your destination. ? If you take public transportation, get off one stop early and walk the rest of the way. ? Make phone calls while standing up and walking around. ? Take the stairs instead of elevators or escalators.  Wear comfortable clothes and shoes with good support.  Do not exercise so much that you hurt yourself, feel dizzy, or get very short of breath. Where to find more information  [place] Department of   Health and Human Services: www.hhs.gov  Centers for Disease Control and Prevention (CDC): www.cdc.gov Contact a health care provider:  Before starting a new exercise program.  If you have questions or concerns about your weight.  If you have a medical problem that  keeps you from exercising. Get help right away if you have any of the following while exercising:  Injury.  Dizziness.  Difficulty breathing or shortness of breath that does not go away when you stop exercising.  Chest pain.  Rapid heartbeat. Summary  Being overweight increases your risk of heart disease, stroke, diabetes, high blood pressure, and several types of cancer.  Losing weight happens when you burn more calories than you eat.  Reducing the amount of calories you eat in addition to getting regular moderate or vigorous exercise each week helps you lose weight. This information is not intended to replace advice given to you by your health care provider. Make sure you discuss any questions you have with your health care provider. Document Released: 10/24/2010 Document Revised: 10/04/2017 Document Reviewed: 10/04/2017 [employer] Patient Education  2020 [employer]  

## 2019-06-19 NOTE — Progress Notes (Signed)
Patient [facility]   Established Patient Office Visit  Subjective:  Patient ID: Jeffrey [name], male    DOB: 03-30-1976  Age: 43 y.o. MRN: [id number]  CC:  Chief Complaint  Patient presents with  . Follow-up    3 month folllow up , no other concerns     HPI Jeffrey [name] is 43 year old male who presents for Follow Up today.   Past Medical History:  Diagnosis Date  . Anxiety   . Aspirin allergy   . Childhood asthma   . Chronic systolic CHF (congestive heart failure) ([place])    a. EF 20-25% in 2012. b. EF 45-50% in 10/2011 with nonischemic nuc - presumed NICM. c. 12/2014 Echo: Sev depressed LV fxn, sev dil LV, mild LVH, mild MR, sev dil LA, mildly reduced RV fxn.  . CKD (chronic kidney disease) stage 2, GFR 60-89 ml/min   . High cholesterol   . Hypertension   . Morbid obesity ([place])   . Nephrolithiasis   . OSA on CPAP   . Presumed NICM    a. 04/2014 Myoview: EF 26%, glob HK, sev glob HK, ? prior infarct;  b. Never cathed 2/2 CKD.  [name] Renal cell carcinoma ([place])    a. s/p Rt robotic assisted partial converted to radical nephrectomy on 01/2013.  . Troponin level elevated    a. 04/2014, 12/2014: felt due to CHF.  . Type II diabetes mellitus ([place])   . Ventricular tachycardia ([place])    a. appropriate ICD therapy 12/2017   Current Status: Since his last office visit, he is doing well with no complaints. His blood glucose is elevated today, because of eating pinneapple this morning with breakfast. He has not been monitoring blood glucose regularly. He has seen low range of 40, (which he quickly ate candy to bring up), and high of 160 since his last office visit. He denies fatigue, frequent urination, blurred vision, excessive hunger, excessive thirst, weight loss, and poor wound healing. He continues to check his feet regularly. He continues to struggle with weight loss. He has not been exercising regularly since [place]. He was previously referred for  Vasectomy, but did not follow through. His anxiety is mild today. He denies suicidal ideations, homicidal ideations, or auditory hallucinations. He denies visual changes, chest pain, cough, shortness of breath, heart palpitations, and falls. He has occasional headaches and dizziness with position changes. Denies severe headaches, confusion, seizures, double vision, and blurred vision, nausea and vomiting.  He denies fevers, chills, fatigue, recent infections, weight loss, and night sweats.  No reports of GI problems such as diarrhea, and constipation. He has no reports of blood in stools, dysuria and hematuria. He denies pain today.   Past Surgical History:  Procedure Laterality Date  . APPENDECTOMY  07/2004  . CARDIAC CATHETERIZATION N/A 05/17/2015   Procedure: Right/Left Heart Cath and Coronary Angiography;  Surgeon: Jolaine Artist, MD;  Location: [facility];  Service: Cardiovascular;  Laterality: N/A;  . EP IMPLANTABLE DEVICE N/A 05/17/2015   Procedure: SubQ ICD Implant;  Surgeon: Will Meredith Leeds, MD;  Location: [facility];  Service: Cardiovascular;  Laterality: N/A;  . ROBOTIC ASSISTED LAPAROSCOPIC LYSIS OF ADHESION  01/13/2013   Procedure: ROBOTIC ASSISTED LAPAROSCOPIC LYSIS OF ADHESION EXTENSIVE;  Surgeon: Alexis Frock, MD;  Location: [facility];  Service: Urology;;  . ROBOTIC ASSITED PARTIAL NEPHRECTOMY Right 01/13/2013   Procedure: ROBOTIC ASSITED PARTIAL NEPHRECTOMY CONVERTED TO ROBOTIC ASSISTED RIGHT  RADICAL NEPHRECTOMY;  Surgeon: Alexis Frock, MD;  Location: [facility];  Service: Urology;  Laterality: Right;    Family History  Problem Relation Age of Onset  . Hypertension Mother   . Diabetes Maternal Aunt   . Heart attack Neg Hx   . Stroke Neg Hx     Social History   Socioeconomic History  . Marital status: Significant Other    Spouse name: Not on file  . Number of children: Not on file  . Years of education: Not on file  . Highest education level: Not on  file  Occupational History  . Not on file  Social Needs  . Financial resource strain: Not on file  . Food insecurity    Worry: Not on file    Inability: Not on file  . Transportation needs    Medical: Not on file    Non-medical: Not on file  Tobacco Use  . Smoking status: Former Smoker    Packs/day: 0.25    Years: 22.00    Pack years: 5.50    Quit date: 04/16/2015    Years since quitting: 4.1  . Smokeless tobacco: Never Used  Substance and Sexual Activity  . Alcohol use: No    Comment: occ   . Drug use: Yes    Frequency: 14.0 times per week    Types: Marijuana    Comment: uses marijuana twice a day  . Sexual activity: Yes    Birth control/protection: Condom  Lifestyle  . Physical activity    Days per week: Not on file    Minutes per session: Not on file  . Stress: Not on file  Relationships  . Social Herbalist on phone: Not on file    Gets together: Not on file    Attends religious service: Not on file    Active member of club or organization: Not on file    Attends meetings of clubs or organizations: Not on file    Relationship status: Not on file  . Intimate partner violence    Fear of current or ex partner: Not on file    Emotionally abused: Not on file    Physically abused: Not on file    Forced sexual activity: Not on file  Other Topics Concern  . Not on file  Social History Narrative  . Not on file    Outpatient Medications Prior to Visit  Medication Sig Dispense Refill  . allopurinol (ZYLOPRIM) 100 MG tablet Take 1 tablet (100 mg total) by mouth daily. 90 tablet 3  . amiodarone (PACERONE) 200 MG tablet Take 1 tablet (200 mg total) by mouth daily. 90 tablet 3  . apixaban (ELIQUIS) 5 MG TABS tablet Take 1 tablet (5 mg total) by mouth 2 (two) times daily. 60 tablet 10  . aspirin EC 81 MG tablet Take 81 mg by mouth daily.    . blood glucose meter kit and supplies Dispense based on patient and insurance preference. Use up to four times daily as  directed. (FOR ICD-10 E10.9, E11.9). 1 each 0  . carvedilol (COREG) 3.125 MG tablet Take 1 tablet (3.125 mg total) by mouth 2 (two) times daily. 60 tablet 2  . cetirizine (ZYRTEC) 10 MG tablet Take 1 tablet (10 mg total) by mouth daily. 30 tablet 6  . fluticasone (FLONASE) 50 MCG/ACT nasal spray Place 2 sprays into both nostrils daily. 16 g 6  . gabapentin (NEURONTIN) 300 MG capsule TAKE 1 CAPSULE BY MOUTH 3 TIMES  DAILY. 90 capsule 3  . glipiZIDE (GLUCOTROL) 10 MG tablet Take 1 tablet (10 mg total) by mouth 2 (two) times daily before a meal. 60 tablet 3  . insulin aspart (NOVOLOG FLEXPEN) 100 UNIT/ML FlexPen Inject 0-15 Units into the skin 3 (three) times daily with meals. CBG 121-150: 2u; CBG 151-200: 3u; CBG 201-250: 5u; CBG 251-300: 8u; CBG 301-350: 11u; CBG 351-400:15 u; CBG>400: 15u 15 mL 1  . insulin NPH-regular Human (70-30) 100 UNIT/ML injection Inject 38 Units into the skin 2 (two) times daily with a meal. 60 mL 3  . Insulin Pen Needle (TRUEPLUS 5-BEVEL PEN NEEDLES) 31G X 6 MM MISC 1 Syringe by Does not apply route as needed. 100 each 12  . Insulin Syringe-Needle U-100 (TRUEPLUS INSULIN SYRINGE) 31G X 5/16" 0.5 ML MISC Use to inject insulin twice daily. 100 each 11  . isosorbide-hydrALAZINE (BIDIL) 20-37.5 MG tablet Take 1 tablet by mouth 3 (three) times daily. 90 tablet 5  . Magnesium Oxide 200 MG TABS Take 100 mg by mouth daily.    . meclizine (ANTIVERT) 25 MG tablet Take 1 tablet (25 mg total) by mouth 3 (three) times daily. 30 tablet 4  . metFORMIN (GLUCOPHAGE) 1000 MG tablet Take 1 tablet (1,000 mg total) by mouth 2 (two) times daily with a meal. 60 tablet 3  . MITIGARE 0.6 MG CAPS TAKE 1 TABLET BY MOUTH 2 TIMES DAILY 180 capsule 3  . ondansetron (ZOFRAN) 4 MG tablet Take 1 tablet (4 mg total) by mouth every 8 (eight) hours as needed for nausea or vomiting. 20 tablet 1  . sacubitril-valsartan (ENTRESTO) 97-103 MG Take 1 tablet by mouth 2 (two) times daily. 180 tablet 3  .  spironolactone (ALDACTONE) 25 MG tablet Take 0.5 tablets (12.5 mg total) by mouth daily. 45 tablet 3  . torsemide (DEMADEX) 20 MG tablet TAKE 2 TABLETS (40 MG TOTAL) BY MOUTH DAILY. 60 tablet 3  . Vitamin D, Ergocalciferol, (DRISDOL) 1.25 MG (50000 UT) CAPS capsule Take 1 capsule (50,000 Units total) by mouth every 30 (thirty) days. 5 capsule 5   No facility-administered medications prior to visit.     Allergies  Allergen Reactions  . Aspirin Shortness Of Breath, Itching and Rash     Burning sensation (Patient reports he tolerates other NSAIDS)   . Bee Venom Hives and Swelling  . Lisinopril Cough  . Tomato Rash    ROS Review of Systems  Constitutional: Positive for fatigue.  HENT: Negative.   Eyes: Negative.   Respiratory: Negative.   Cardiovascular: Negative.   Gastrointestinal: Positive for abdominal distention.  Endocrine: Negative.   Genitourinary: Negative.   Musculoskeletal: Negative.   Skin: Negative.   Allergic/Immunologic: Negative.   Neurological: Positive for dizziness (occasional) and headaches (occasional ).  Hematological: Negative.   Psychiatric/Behavioral: Negative.       Objective:    Physical Exam  Constitutional: He is oriented to person, place, and time. He appears well-developed and well-nourished.  HENT:  Head: Normocephalic and atraumatic.  Eyes: Conjunctivae are normal.  Neck: Normal range of motion. Neck supple.  Cardiovascular: Normal rate, regular rhythm, normal heart sounds and intact distal pulses.  Pulmonary/Chest: Effort normal and breath sounds normal.  Abdominal: Soft. Bowel sounds are normal. He exhibits distension (obese).  Musculoskeletal: Normal range of motion.  Neurological: He is alert and oriented to person, place, and time. He has normal reflexes.  Skin: Skin is warm and dry.  Psychiatric: He has a normal mood and affect. His behavior  is normal. Judgment and thought content normal.  Nursing note and vitals reviewed.   BP  121/78 (BP Location: Left Arm, Patient Position: Sitting, Cuff Size: Large)   Pulse 83   Temp 98.7 F (37.1 C) (Oral)   Ht '5\' 9"'  (1.753 m)   Wt (!) 314 lb 9.6 oz (142.7 kg)   SpO2 94%   BMI 46.46 kg/m  Wt Readings from Last 3 Encounters:  06/19/19 (!) 314 lb 9.6 oz (142.7 kg)  03/29/19 (!) 313 lb 4 oz (142.1 kg)  03/17/19 (!) 308 lb (139.7 kg)     Health Maintenance Due  Topic Date Due  . OPHTHALMOLOGY EXAM  11/02/1984    There are no preventive care reminders to display for this patient.  Lab Results  Component Value Date   TSH 1.874 01/19/2019   Lab Results  Component Value Date   WBC 5.9 03/17/2019   HGB 12.7 (L) 03/17/2019   HCT 41.1 03/17/2019   MCV 78 (L) 03/17/2019   PLT 304 03/17/2019   Lab Results  Component Value Date   NA 139 03/17/2019   K 4.6 03/17/2019   CO2 19 (L) 03/17/2019   GLUCOSE 152 (H) 03/17/2019   BUN 25 (H) 03/17/2019   CREATININE 1.77 (H) 03/17/2019   BILITOT 0.4 03/17/2019   ALKPHOS 74 03/17/2019   AST 22 03/17/2019   ALT 25 03/17/2019   PROT 7.3 03/17/2019   ALBUMIN 4.2 03/17/2019   CALCIUM 10.0 03/17/2019   ANIONGAP 9 02/20/2019   GFR 58.42 (L) 01/22/2015   Lab Results  Component Value Date   CHOL 125 03/17/2019   Lab Results  Component Value Date   HDL 34 (L) 03/17/2019   Lab Results  Component Value Date   LDLCALC 43 03/17/2019   Lab Results  Component Value Date   TRIG 240 (H) 03/17/2019   Lab Results  Component Value Date   CHOLHDL 3.7 03/17/2019   Lab Results  Component Value Date   HGBA1C 6.8 (A) 06/19/2019   HGBA1C 6.8 06/19/2019   HGBA1C 6.8 (A) 06/19/2019   HGBA1C 6.8 06/19/2019   Assessment & Plan:   1. Type 2 diabetes mellitus with stage 3 chronic kidney disease, with long-term current use of insulin (HCC) Much improved. Hgb A1c is decreased at 6.8 today, from 10.7 on 03/17/2019. He will continue to decrease foods/beverages high in sugars and carbs and follow Heart Healthy or DASH diet. Increase  physical activity to at least 30 minutes cardio exercise daily.  - POCT Glucose (Device for Home Use) - POCT Urinalysis Dipstick - HgB A1c  2. Class 3 severe obesity due to excess calories with serious comorbidity and body mass index (BMI) of 45.0 to 49.9 in adult [facility]) .Body mass index is 46.46 kg/m. Goal BMI  is <30. Encouraged efforts to reduce weight include engaging in physical activity as tolerated with goal of 150 minutes per week. Improve dietary choices and eat a meal regimen consistent with a Mediterranean or DASH diet. Reduce simple carbohydrates. Do not skip meals and eat healthy snacks throughout the day to avoid over-eating at dinner. Set a goal weight loss that is achievable for you.  3. Vasectomy evaluation - Ambulatory referral to Urology  4. Visual problems - Ambulatory referral to Ophthalmology  5. Blurry vision, bilateral - Ambulatory referral to Ophthalmology  6. Influenza vaccine refused  7. Follow up He will follow up in 3 months.   No orders of the defined types were placed in this encounter.  Orders Placed This Encounter  Procedures  . Ambulatory referral to Urology  . Ambulatory referral to Ophthalmology  . POCT Glucose (Device for Home Use)  . POCT Urinalysis Dipstick  . HgB A1c     Referral Orders     Ambulatory referral to Urology     Ambulatory referral to Ophthalmology   Kathe Becton,  MSN, FNP-BC [place] [address] [place], [place] [address] [phone] fax   Problem List Items Addressed This Visit      Endocrine   Type 2 diabetes mellitus with stage 3 chronic kidney disease, with long-term current use of insulin ([place]) - Primary (Chronic)   Relevant Orders   POCT Glucose (Device for Home Use) (Completed)   POCT Urinalysis Dipstick (Completed)   HgB A1c (Completed)     Other   Class 3 severe obesity due to excess calories with serious  comorbidity and body mass index (BMI) of 50.0 to 59.9 in adult [facility])    Other Visit Diagnoses    Vasectomy evaluation       Relevant Orders   Ambulatory referral to Urology   Visual problems       Relevant Orders   Ambulatory referral to Ophthalmology   Blurry vision, bilateral       Relevant Orders   Ambulatory referral to Ophthalmology   Influenza vaccine refused       Follow up          No orders of the defined types were placed in this encounter.   Follow-up: Return in about 3 months (around 09/18/2019).    [name], FNP

## 2019-06-20 DIAGNOSIS — H538 Other visual disturbances: Secondary | ICD-10-CM | POA: Insufficient documentation

## 2019-06-20 DIAGNOSIS — H547 Unspecified visual loss: Secondary | ICD-10-CM | POA: Insufficient documentation

## 2019-06-20 DIAGNOSIS — Z3009 Encounter for other general counseling and advice on contraception: Secondary | ICD-10-CM | POA: Insufficient documentation

## 2019-06-27 DIAGNOSIS — H35013 Changes in retinal vascular appearance, bilateral: Secondary | ICD-10-CM | POA: Diagnosis not present

## 2019-06-27 DIAGNOSIS — H524 Presbyopia: Secondary | ICD-10-CM | POA: Diagnosis not present

## 2019-06-27 DIAGNOSIS — E119 Type 2 diabetes mellitus without complications: Secondary | ICD-10-CM | POA: Diagnosis not present

## 2019-06-27 DIAGNOSIS — H2513 Age-related nuclear cataract, bilateral: Secondary | ICD-10-CM | POA: Diagnosis not present

## 2019-07-10 ENCOUNTER — Ambulatory Visit (INDEPENDENT_AMBULATORY_CARE_PROVIDER_SITE_OTHER): Payer: Medicaid Other | Admitting: *Deleted

## 2019-07-10 DIAGNOSIS — I472 Ventricular tachycardia, unspecified: Secondary | ICD-10-CM

## 2019-07-10 DIAGNOSIS — I480 Paroxysmal atrial fibrillation: Secondary | ICD-10-CM

## 2019-07-10 LAB — CUP PACEART REMOTE DEVICE CHECK
Battery Remaining Percentage: 52 %
Date Time Interrogation Session: 20201005144900
Implantable Lead Implant Date: 20160812
Implantable Lead Location: 753862
Implantable Lead Model: 3401
Implantable Pulse Generator Implant Date: 20160812
Pulse Gen Serial Number: 117162

## 2019-07-17 ENCOUNTER — Other Ambulatory Visit: Payer: Self-pay | Admitting: Family Medicine

## 2019-07-17 DIAGNOSIS — R42 Dizziness and giddiness: Secondary | ICD-10-CM

## 2019-07-17 MED FILL — MECLIZINE 25 MG TABLET: 25 | 10 days supply | Qty: 30 | Fill #0

## 2019-07-17 MED FILL — AMIODARONE HCL 200 MG TAB: 200 | 90 days supply | Qty: 90 | Fill #1

## 2019-07-17 MED FILL — TORSEMIDE 20 MG TABLET: 20 | 30 days supply | Qty: 60 | Fill #1

## 2019-07-17 NOTE — Progress Notes (Signed)
Remote ICD transmission.   

## 2019-07-18 MED FILL — ALLOPURINOL 100 MG TABLET: 100 | 30 days supply | Qty: 60 | Fill #5

## 2019-07-19 ENCOUNTER — Encounter (HOSPITAL_COMMUNITY): Payer: Self-pay

## 2019-07-19 ENCOUNTER — Ambulatory Visit (HOSPITAL_COMMUNITY)
Admission: EM | Admit: 2019-07-19 | Discharge: 2019-07-19 | Disposition: A | Discharge status: Home / Self Care | Payer: Medicaid Other | Attending: Urgent Care | Admitting: Urgent Care

## 2019-07-19 ENCOUNTER — Other Ambulatory Visit: Payer: Self-pay

## 2019-07-19 DIAGNOSIS — I130 Hypertensive heart and chronic kidney disease with heart failure and stage 1 through stage 4 chronic kidney disease, or unspecified chronic kidney disease: Secondary | ICD-10-CM | POA: Diagnosis not present

## 2019-07-19 DIAGNOSIS — Z79899 Other long term (current) drug therapy: Secondary | ICD-10-CM | POA: Insufficient documentation

## 2019-07-19 DIAGNOSIS — E1122 Type 2 diabetes mellitus with diabetic chronic kidney disease: Secondary | ICD-10-CM | POA: Diagnosis not present

## 2019-07-19 DIAGNOSIS — R05 Cough: Secondary | ICD-10-CM

## 2019-07-19 DIAGNOSIS — Z95810 Presence of automatic (implantable) cardiac defibrillator: Secondary | ICD-10-CM | POA: Diagnosis not present

## 2019-07-19 DIAGNOSIS — Z85528 Personal history of other malignant neoplasm of kidney: Secondary | ICD-10-CM | POA: Insufficient documentation

## 2019-07-19 DIAGNOSIS — Z20828 Contact with and (suspected) exposure to other viral communicable diseases: Secondary | ICD-10-CM | POA: Insufficient documentation

## 2019-07-19 DIAGNOSIS — R0982 Postnasal drip: Secondary | ICD-10-CM | POA: Insufficient documentation

## 2019-07-19 DIAGNOSIS — G4733 Obstructive sleep apnea (adult) (pediatric): Secondary | ICD-10-CM | POA: Diagnosis not present

## 2019-07-19 DIAGNOSIS — J3089 Other allergic rhinitis: Secondary | ICD-10-CM

## 2019-07-19 DIAGNOSIS — Z87891 Personal history of nicotine dependence: Secondary | ICD-10-CM | POA: Insufficient documentation

## 2019-07-19 DIAGNOSIS — I4891 Unspecified atrial fibrillation: Secondary | ICD-10-CM | POA: Insufficient documentation

## 2019-07-19 DIAGNOSIS — B349 Viral infection, unspecified: Secondary | ICD-10-CM | POA: Insufficient documentation

## 2019-07-19 DIAGNOSIS — I5022 Chronic systolic (congestive) heart failure: Secondary | ICD-10-CM | POA: Insufficient documentation

## 2019-07-19 DIAGNOSIS — N182 Chronic kidney disease, stage 2 (mild): Secondary | ICD-10-CM | POA: Diagnosis not present

## 2019-07-19 DIAGNOSIS — Z7982 Long term (current) use of aspirin: Secondary | ICD-10-CM | POA: Insufficient documentation

## 2019-07-19 DIAGNOSIS — J45909 Unspecified asthma, uncomplicated: Secondary | ICD-10-CM | POA: Diagnosis not present

## 2019-07-19 DIAGNOSIS — Z794 Long term (current) use of insulin: Secondary | ICD-10-CM | POA: Insufficient documentation

## 2019-07-19 DIAGNOSIS — Z8679 Personal history of other diseases of the circulatory system: Secondary | ICD-10-CM | POA: Diagnosis not present

## 2019-07-19 DIAGNOSIS — Z905 Acquired absence of kidney: Secondary | ICD-10-CM | POA: Insufficient documentation

## 2019-07-19 DIAGNOSIS — Z7951 Long term (current) use of inhaled steroids: Secondary | ICD-10-CM | POA: Insufficient documentation

## 2019-07-19 DIAGNOSIS — E7800 Pure hypercholesterolemia, unspecified: Secondary | ICD-10-CM | POA: Diagnosis not present

## 2019-07-19 DIAGNOSIS — Z7901 Long term (current) use of anticoagulants: Secondary | ICD-10-CM | POA: Insufficient documentation

## 2019-07-19 DIAGNOSIS — R059 Cough, unspecified: Secondary | ICD-10-CM

## 2019-07-19 DIAGNOSIS — Z8249 Family history of ischemic heart disease and other diseases of the circulatory system: Secondary | ICD-10-CM | POA: Diagnosis not present

## 2019-07-19 LAB — BRAIN NATRIURETIC PEPTIDE: B Natriuretic Peptide: 31.8 pg/mL (ref 0.0–100.0)

## 2019-07-19 MED ORDER — BENZONATATE 100 MG PO CAPS: 100.0000 mg | ORAL_CAPSULE | Freq: Three times a day (TID) | ORAL | 0 refills | Status: DC | PRN

## 2019-07-19 MED ORDER — PROMETHAZINE-DM 6.25-15 MG/5ML PO SYRP: 5.0000 mL | ORAL_SOLUTION | Freq: Three times a day (TID) | ORAL | 0 refills | Status: DC | PRN

## 2019-07-19 MED FILL — PROMETHAZINE 6.25 MG/5 ML S: 6.25 | 30 days supply | Qty: 100 | Fill #0

## 2019-07-19 NOTE — ED Provider Notes (Signed)
MRN: [id number] DOB: [birth year]  Subjective:   Jeffrey [name] is a 43 y.o. male presenting for 3 day hx of mild-moderate persistent productive cough, chest congestion. Has tried ginger tea with vinegar. Has a hx of asthma in childhood/young adult, hx of allergies.  He has not had an asthma attack in about 25 years.  He does have bad allergies, manages this with Zyrtec daily.  Blood sugars are well controlled per patient.  States that the highest recent blood sugar he measured was in the 130s after he ate breakfast.  He does take insulin and glipizide.  He also has a history of OSA, CHF, atrial fibrillation and has implanted defibrillator.  He is close follow-up with his cardiologist.    No current facility-administered medications for this encounter.   Current Outpatient Medications:  .  allopurinol (ZYLOPRIM) 100 MG tablet, Take 1 tablet (100 mg total) by mouth daily., Disp: 90 tablet, Rfl: 3 .  amiodarone (PACERONE) 200 MG tablet, Take 1 tablet (200 mg total) by mouth daily., Disp: 90 tablet, Rfl: 3 .  apixaban (ELIQUIS) 5 MG TABS tablet, Take 1 tablet (5 mg total) by mouth 2 (two) times daily., Disp: 60 tablet, Rfl: 10 .  aspirin EC 81 MG tablet, Take 81 mg by mouth daily., Disp: , Rfl:  .  blood glucose meter kit and supplies, Dispense based on patient and insurance preference. Use up to four times daily as directed. (FOR ICD-10 E10.9, E11.9)., Disp: 1 each, Rfl: 0 .  carvedilol (COREG) 3.125 MG tablet, Take 1 tablet (3.125 mg total) by mouth 2 (two) times daily., Disp: 60 tablet, Rfl: 2 .  cetirizine (ZYRTEC) 10 MG tablet, Take 1 tablet (10 mg total) by mouth daily., Disp: 30 tablet, Rfl: 6 .  fluticasone (FLONASE) 50 MCG/ACT nasal spray, Place 2 sprays into both nostrils daily., Disp: 16 g, Rfl: 6 .  gabapentin (NEURONTIN) 300 MG capsule, TAKE 1 CAPSULE BY MOUTH 3 TIMES DAILY., Disp: 90 capsule, Rfl: 3 .  glipiZIDE (GLUCOTROL) 10 MG tablet, Take 1 tablet (10 mg total) by mouth 2 (two) times  daily before a meal., Disp: 60 tablet, Rfl: 3 .  insulin aspart (NOVOLOG FLEXPEN) 100 UNIT/ML FlexPen, Inject 0-15 Units into the skin 3 (three) times daily with meals. CBG 121-150: 2u; CBG 151-200: 3u; CBG 201-250: 5u; CBG 251-300: 8u; CBG 301-350: 11u; CBG 351-400:15 u; CBG>400: 15u, Disp: 15 mL, Rfl: 1 .  insulin NPH-regular Human (70-30) 100 UNIT/ML injection, Inject 38 Units into the skin 2 (two) times daily with a meal., Disp: 60 mL, Rfl: 3 .  Insulin Pen Needle (TRUEPLUS 5-BEVEL PEN NEEDLES) 31G X 6 MM MISC, 1 Syringe by Does not apply route as needed., Disp: 100 each, Rfl: 12 .  Insulin Syringe-Needle U-100 (TRUEPLUS INSULIN SYRINGE) 31G X 5/16" 0.5 ML MISC, Use to inject insulin twice daily., Disp: 100 each, Rfl: 11 .  isosorbide-hydrALAZINE (BIDIL) 20-37.5 MG tablet, Take 1 tablet by mouth 3 (three) times daily., Disp: 90 tablet, Rfl: 5 .  Magnesium Oxide 200 MG TABS, Take 100 mg by mouth daily., Disp: , Rfl:  .  meclizine (ANTIVERT) 25 MG tablet, TAKE 1 TABLET (25 MG TOTAL) BY MOUTH 3 (THREE) TIMES DAILY., Disp: 30 tablet, Rfl: 4 .  metFORMIN (GLUCOPHAGE) 1000 MG tablet, Take 1 tablet (1,000 mg total) by mouth 2 (two) times daily with a meal., Disp: 60 tablet, Rfl: 3 .  MITIGARE 0.6 MG CAPS, TAKE 1 TABLET BY MOUTH 2 TIMES DAILY, Disp: 180  capsule, Rfl: 3 .  ondansetron (ZOFRAN) 4 MG tablet, Take 1 tablet (4 mg total) by mouth every 8 (eight) hours as needed for nausea or vomiting., Disp: 20 tablet, Rfl: 1 .  sacubitril-valsartan (ENTRESTO) 97-103 MG, Take 1 tablet by mouth 2 (two) times daily., Disp: 180 tablet, Rfl: 3 .  spironolactone (ALDACTONE) 25 MG tablet, Take 0.5 tablets (12.5 mg total) by mouth daily., Disp: 45 tablet, Rfl: 3 .  torsemide (DEMADEX) 20 MG tablet, TAKE 2 TABLETS (40 MG TOTAL) BY MOUTH DAILY., Disp: 60 tablet, Rfl: 3 .  Vitamin D, Ergocalciferol, (DRISDOL) 1.25 MG (50000 UT) CAPS capsule, Take 1 capsule (50,000 Units total) by mouth every 30 (thirty) days., Disp: 5  capsule, Rfl: 5    Allergies  Allergen Reactions  . Aspirin Shortness Of Breath, Itching and Rash     Burning sensation (Patient reports he tolerates other NSAIDS)   . Bee Venom Hives and Swelling  . Lisinopril Cough  . Tomato Rash    Past Medical History:  Diagnosis Date  . Anxiety   . Aspirin allergy   . Childhood asthma   . Chronic systolic CHF (congestive heart failure) ([place])    a. EF 20-25% in 2012. b. EF 45-50% in 10/2011 with nonischemic nuc - presumed NICM. c. 12/2014 Echo: Sev depressed LV fxn, sev dil LV, mild LVH, mild MR, sev dil LA, mildly reduced RV fxn.  . CKD (chronic kidney disease) stage 2, GFR 60-89 ml/min   . High cholesterol   . Hypertension   . Morbid obesity ([place])   . Nephrolithiasis   . OSA on CPAP   . Presumed NICM    a. 04/2014 Myoview: EF 26%, glob HK, sev glob HK, ? prior infarct;  b. Never cathed 2/2 CKD.  [name] Renal cell carcinoma ([place])    a. s/p Rt robotic assisted partial converted to radical nephrectomy on 01/2013.  . Troponin level elevated    a. 04/2014, 12/2014: felt due to CHF.  . Type II diabetes mellitus ([place])   . Ventricular tachycardia ([place])    a. appropriate ICD therapy 12/2017     Past Surgical History:  Procedure Laterality Date  . APPENDECTOMY  07/2004  . CARDIAC CATHETERIZATION N/A 05/17/2015   Procedure: Right/Left Heart Cath and Coronary Angiography;  Surgeon: Jolaine Artist, MD;  Location: [facility];  Service: Cardiovascular;  Laterality: N/A;  . EP IMPLANTABLE DEVICE N/A 05/17/2015   Procedure: SubQ ICD Implant;  Surgeon: Will Meredith Leeds, MD;  Location: [facility];  Service: Cardiovascular;  Laterality: N/A;  . ROBOTIC ASSISTED LAPAROSCOPIC LYSIS OF ADHESION  01/13/2013   Procedure: ROBOTIC ASSISTED LAPAROSCOPIC LYSIS OF ADHESION EXTENSIVE;  Surgeon: Alexis Frock, MD;  Location: [facility];  Service: Urology;;  . ROBOTIC ASSITED PARTIAL NEPHRECTOMY Right 01/13/2013   Procedure: ROBOTIC ASSITED PARTIAL NEPHRECTOMY  CONVERTED TO ROBOTIC ASSISTED RIGHT RADICAL NEPHRECTOMY;  Surgeon: Alexis Frock, MD;  Location[facility];  Service: Urology;  Laterality: Right;    Social History   Tobacco Use  . Smoking status: Former Smoker    Packs/day: 0.25    Years: 22.00    Pack years: 5.50    Quit date: 04/16/2015    Years since quitting: 4.2  . Smokeless tobacco: Never Used  Substance Use Topics  . Alcohol use: No    Comment: occ   . Drug use: Yes    Frequency: 14.0 times per week    Types: Marijuana    Comment: uses marijuana  twice a day    Family History  Problem Relation Age of Onset  . Hypertension Mother   . Diabetes Maternal Aunt   . Heart attack Neg Hx   . Stroke Neg Hx     Review of Systems  Constitutional: Negative for fever and malaise/fatigue.  HENT: Negative for congestion, ear pain, sinus pain and sore throat.   Eyes: Negative for discharge and redness.  Respiratory: Positive for cough. Negative for hemoptysis, shortness of breath and wheezing.   Cardiovascular: Negative for chest pain.  Gastrointestinal: Negative for abdominal pain, diarrhea, nausea and vomiting.  Genitourinary: Negative for dysuria, flank pain and hematuria.  Musculoskeletal: Negative for myalgias.  Skin: Negative for rash.  Neurological: Negative for dizziness, weakness and headaches.  Psychiatric/Behavioral: Negative for depression and substance abuse.    Objective:   Vitals: BP 121/80 (BP Location: Right Arm)   Pulse 92   Temp 99.1 F (37.3 C) (Oral)   Wt (!) 310 lb (140.6 kg)   SpO2 98%   BMI 45.78 kg/m   Physical Exam Constitutional:      General: He is not in acute distress.    Appearance: Normal appearance. He is well-developed. He is obese. He is not ill-appearing, toxic-appearing or diaphoretic.  HENT:     Head: Normocephalic and atraumatic.     Right Ear: External ear normal.     Left Ear: External ear normal.     Nose: Nose normal.     Mouth/Throat:     Mouth: Mucous membranes are  moist.     Pharynx: Oropharynx is clear.  Eyes:     General: No scleral icterus.    Extraocular Movements: Extraocular movements intact.     Pupils: Pupils are equal, round, and reactive to light.  Cardiovascular:     Rate and Rhythm: Normal rate and regular rhythm.     Heart sounds: Normal heart sounds. No murmur. No friction rub. No gallop.   Pulmonary:     Effort: Pulmonary effort is normal. No respiratory distress.     Breath sounds: Normal breath sounds. No stridor. No wheezing, rhonchi or rales.  Neurological:     Mental Status: He is alert and oriented to person, place, and time.  Psychiatric:        Mood and Affect: Mood normal.        Behavior: Behavior normal.        Thought Content: Thought content normal.     Assessment and Plan :   1. Viral illness   2. Allergic rhinitis due to other allergic trigger, unspecified seasonality   3. Cough   4. Status post implantation of automatic cardioverter/defibrillator (AICD)   5. History of CHF (congestive heart failure)   6. OSA (obstructive sleep apnea)   7. Post-nasal drainage     Patient likely has viral illness that his other family members had and is having difficulty clearing it due to his persistent allergies.  Recommend supportive care, will hold off on prednisone for allergic rhinitis.  We will also look at his BNP level.  COVID-19 testing pending, low suspicion for this given patient has practiced social distancing well.  Counseled patient on potential for adverse effects with medications prescribed/recommended today, ER and return-to-clinic precautions discussed, patient verbalized understanding.    Jaynee Eagles, PA-C 07/19/19 [id number]

## 2019-07-19 NOTE — ED Triage Notes (Signed)
Pt states he has a cough x 2 days.

## 2019-07-19 NOTE — Discharge Instructions (Addendum)
We will manage this as a viral syndrome. For sore throat or cough try using a honey-based tea. Use 3 teaspoons of honey with juice squeezed from half lemon. Place shaved pieces of ginger into 1/2-1 cup of water and warm over stove top. Then mix the ingredients and repeat every 4 hours as needed. Please take Tylenol 500mg  every 6 hours. Hydrate very well with at least 2 liters of water. Eat light meals such as soups to replenish electrolytes and soft fruits, veggies. Start an antihistamine like Zyrtec, Allegra or Claritin for postnasal drainage, sinus congestion.  You can take this together with pseudoephedrine (Sudafed) at a dose of 60mg  2 times a day as needed for the same kind of congestion.

## 2019-07-23 LAB — NOVEL CORONAVIRUS, NAA (HOSP ORDER, SEND-OUT TO REF LAB; TAT 18-24 HRS): SARS-CoV-2, NAA: NOT DETECTED

## 2019-08-02 ENCOUNTER — Other Ambulatory Visit: Payer: Self-pay | Admitting: Family Medicine

## 2019-08-02 MED FILL — ENTRESTO 97 MG-103 MG TAB: 97-103 | 30 days supply | Qty: 60 | Fill #5

## 2019-08-02 MED FILL — ATORVASTATIN CALCIUM 20 MG: 20 | 30 days supply | Qty: 30 | Fill #2

## 2019-08-02 MED FILL — MECLIZINE 25 MG TABLET: 25 | 10 days supply | Qty: 30 | Fill #1

## 2019-08-02 MED FILL — ELIQUIS 5 MG TABLET: 5 | 30 days supply | Qty: 60 | Fill #5

## 2019-08-03 MED FILL — ACCU-CHEK FASTCLIX LANCETS: 25 days supply | Qty: 102 | Fill #0

## 2019-08-03 MED FILL — ACCU-CHEK GUIDE TEST STRIP: 25 days supply | Qty: 100 | Fill #0

## 2019-08-16 DIAGNOSIS — M109 Gout, unspecified: Secondary | ICD-10-CM | POA: Diagnosis not present

## 2019-08-16 DIAGNOSIS — D649 Anemia, unspecified: Secondary | ICD-10-CM | POA: Diagnosis not present

## 2019-08-16 DIAGNOSIS — I129 Hypertensive chronic kidney disease with stage 1 through stage 4 chronic kidney disease, or unspecified chronic kidney disease: Secondary | ICD-10-CM | POA: Diagnosis not present

## 2019-08-16 DIAGNOSIS — N2581 Secondary hyperparathyroidism of renal origin: Secondary | ICD-10-CM | POA: Diagnosis not present

## 2019-08-16 DIAGNOSIS — N183 Chronic kidney disease, stage 3 unspecified: Secondary | ICD-10-CM | POA: Diagnosis not present

## 2019-08-16 DIAGNOSIS — E669 Obesity, unspecified: Secondary | ICD-10-CM | POA: Diagnosis not present

## 2019-08-16 DIAGNOSIS — Z905 Acquired absence of kidney: Secondary | ICD-10-CM | POA: Diagnosis not present

## 2019-08-17 DIAGNOSIS — G4733 Obstructive sleep apnea (adult) (pediatric): Secondary | ICD-10-CM | POA: Diagnosis not present

## 2019-09-04 MED FILL — ALLOPURINOL 100 MG TABLET: 100 | 30 days supply | Qty: 60 | Fill #6

## 2019-09-04 MED FILL — SPIRONOLACTONE 25 MG TABLET: 25 | 60 days supply | Qty: 30 | Fill #2

## 2019-09-04 MED FILL — TORSEMIDE 20 MG TABLET: 20 | 30 days supply | Qty: 60 | Fill #2

## 2019-09-04 MED FILL — GABAPENTIN 300 MG CAPSULE: 300 | 30 days supply | Qty: 90 | Fill #1

## 2019-09-18 ENCOUNTER — Other Ambulatory Visit: Payer: Self-pay

## 2019-09-18 ENCOUNTER — Ambulatory Visit (INDEPENDENT_AMBULATORY_CARE_PROVIDER_SITE_OTHER): Payer: Medicaid Other | Admitting: Family Medicine

## 2019-09-18 ENCOUNTER — Encounter: Payer: Self-pay | Admitting: Family Medicine

## 2019-09-18 VITALS — BP 119/71 | HR 79 | Temp 97.9°F | Ht 69.0 in | Wt 330.8 lb

## 2019-09-18 DIAGNOSIS — Z6841 Body Mass Index (BMI) 40.0 and over, adult: Secondary | ICD-10-CM | POA: Diagnosis not present

## 2019-09-18 DIAGNOSIS — M109 Gout, unspecified: Secondary | ICD-10-CM

## 2019-09-18 DIAGNOSIS — I5022 Chronic systolic (congestive) heart failure: Secondary | ICD-10-CM | POA: Diagnosis not present

## 2019-09-18 DIAGNOSIS — N183 Chronic kidney disease, stage 3 unspecified: Secondary | ICD-10-CM | POA: Diagnosis not present

## 2019-09-18 DIAGNOSIS — F419 Anxiety disorder, unspecified: Secondary | ICD-10-CM

## 2019-09-18 DIAGNOSIS — J302 Other seasonal allergic rhinitis: Secondary | ICD-10-CM

## 2019-09-18 DIAGNOSIS — R7303 Prediabetes: Secondary | ICD-10-CM

## 2019-09-18 DIAGNOSIS — R42 Dizziness and giddiness: Secondary | ICD-10-CM

## 2019-09-18 DIAGNOSIS — E1122 Type 2 diabetes mellitus with diabetic chronic kidney disease: Secondary | ICD-10-CM

## 2019-09-18 DIAGNOSIS — E66813 Obesity, class 3: Secondary | ICD-10-CM

## 2019-09-18 DIAGNOSIS — E119 Type 2 diabetes mellitus without complications: Secondary | ICD-10-CM

## 2019-09-18 DIAGNOSIS — R739 Hyperglycemia, unspecified: Secondary | ICD-10-CM | POA: Diagnosis not present

## 2019-09-18 DIAGNOSIS — I10 Essential (primary) hypertension: Secondary | ICD-10-CM

## 2019-09-18 DIAGNOSIS — Z3009 Encounter for other general counseling and advice on contraception: Secondary | ICD-10-CM | POA: Diagnosis not present

## 2019-09-18 DIAGNOSIS — Z794 Long term (current) use of insulin: Secondary | ICD-10-CM | POA: Diagnosis not present

## 2019-09-18 DIAGNOSIS — Z23 Encounter for immunization: Secondary | ICD-10-CM

## 2019-09-18 DIAGNOSIS — R8290 Unspecified abnormal findings in urine: Secondary | ICD-10-CM

## 2019-09-18 DIAGNOSIS — Z09 Encounter for follow-up examination after completed treatment for conditions other than malignant neoplasm: Secondary | ICD-10-CM

## 2019-09-18 DIAGNOSIS — E559 Vitamin D deficiency, unspecified: Secondary | ICD-10-CM

## 2019-09-18 LAB — POCT URINALYSIS DIPSTICK
Bilirubin, UA: NEGATIVE
Glucose, UA: NEGATIVE
Ketones, UA: NEGATIVE
Leukocytes, UA: NEGATIVE
Nitrite, UA: NEGATIVE
Protein, UA: POSITIVE — AB
Spec Grav, UA: 1.015 (ref 1.010–1.025)
Urobilinogen, UA: 0.2 E.U./dL
pH, UA: 5.5 (ref 5.0–8.0)

## 2019-09-18 LAB — GLUCOSE, POCT (MANUAL RESULT ENTRY): POC Glucose: 201 mg/dl — AB (ref 70–99)

## 2019-09-18 MED ORDER — GLIPIZIDE 10 MG PO TABS: 10.0000 mg | ORAL_TABLET | Freq: Two times a day (BID) | ORAL | 6 refills | Status: DC

## 2019-09-18 MED ORDER — APIXABAN 5 MG PO TABS: 5.0000 mg | ORAL_TABLET | Freq: Two times a day (BID) | ORAL | 10 refills | Status: DC

## 2019-09-18 MED ORDER — METFORMIN HCL 1000 MG PO TABS: 1000.0000 mg | ORAL_TABLET | Freq: Two times a day (BID) | ORAL | 6 refills | Status: DC

## 2019-09-18 MED ORDER — INSULIN NPH ISOPHANE & REGULAR (70-30) 100 UNIT/ML ~~LOC~~ SUSP: 38.0000 [IU] | Freq: Two times a day (BID) | SUBCUTANEOUS | 12 refills | Status: DC

## 2019-09-18 MED ORDER — AMIODARONE HCL 200 MG PO TABS: 200.0000 mg | ORAL_TABLET | Freq: Every day | ORAL | 6 refills | Status: DC

## 2019-09-18 MED ORDER — SPIRONOLACTONE 25 MG PO TABS: 12.5000 mg | ORAL_TABLET | Freq: Every day | ORAL | 6 refills | Status: DC

## 2019-09-18 MED ORDER — SACUBITRIL-VALSARTAN 97-103 MG PO TABS: 1.0000 | ORAL_TABLET | Freq: Two times a day (BID) | ORAL | 6 refills | Status: DC

## 2019-09-18 MED ORDER — BUSPIRONE HCL 10 MG PO TABS: 10.0000 mg | ORAL_TABLET | Freq: Two times a day (BID) | ORAL | 3 refills | Status: DC

## 2019-09-18 MED ORDER — MECLIZINE HCL 25 MG PO TABS: 25.0000 mg | ORAL_TABLET | Freq: Three times a day (TID) | ORAL | 6 refills | Status: DC

## 2019-09-18 MED ORDER — GABAPENTIN 300 MG PO CAPS: 300.0000 mg | ORAL_CAPSULE | Freq: Three times a day (TID) | ORAL | 6 refills | Status: DC

## 2019-09-18 MED ORDER — NOVOLOG FLEXPEN 100 UNIT/ML ~~LOC~~ SOPN: 0.0000 [IU] | PEN_INJECTOR | Freq: Three times a day (TID) | SUBCUTANEOUS | 12 refills | Status: DC

## 2019-09-18 MED ORDER — CARVEDILOL 3.125 MG PO TABS: 3.1250 mg | ORAL_TABLET | Freq: Two times a day (BID) | ORAL | 6 refills | Status: DC

## 2019-09-18 MED ORDER — TORSEMIDE 20 MG PO TABS: 40.0000 mg | ORAL_TABLET | Freq: Every day | ORAL | 6 refills | Status: DC

## 2019-09-18 MED ORDER — CETIRIZINE HCL 10 MG PO TABS: 10.0000 mg | ORAL_TABLET | Freq: Every day | ORAL | 6 refills | Status: DC

## 2019-09-18 MED ORDER — VITAMIN D (ERGOCALCIFEROL) 1.25 MG (50000 UNIT) PO CAPS: 50000.0000 [IU] | ORAL_CAPSULE | ORAL | 6 refills | Status: DC

## 2019-09-18 MED ORDER — COLCHICINE 0.6 MG PO CAPS: 1.0000 | ORAL_CAPSULE | Freq: Two times a day (BID) | ORAL | 6 refills | Status: DC

## 2019-09-18 MED ORDER — ALLOPURINOL 100 MG PO TABS: 100.0000 mg | ORAL_TABLET | Freq: Every day | ORAL | 6 refills | Status: DC

## 2019-09-18 MED ORDER — FLUTICASONE PROPIONATE 50 MCG/ACT NA SUSP: 2.0000 | Freq: Every day | NASAL | 6 refills | Status: DC

## 2019-09-18 MED FILL — VIT D2 1.25 MG (50,000 UNIT: 1.25 MG | 28 days supply | Qty: 4 | Fill #0

## 2019-09-18 MED FILL — MECLIZINE 25 MG TABLET: 25 | 10 days supply | Qty: 30 | Fill #0

## 2019-09-18 MED FILL — CETIRIZINE HCL 10 MG TABS: 10 | 90 days supply | Qty: 90 | Fill #0

## 2019-09-18 MED FILL — busPIRone HCL 10 MG TABS: 10 | 30 days supply | Qty: 60 | Fill #0

## 2019-09-18 MED FILL — glipiZIDE 10 MG TABS: 10 | 90 days supply | Qty: 180 | Fill #0

## 2019-09-18 MED FILL — MITIGARE 0.6 MG CAPS: 0.6 | 30 days supply | Qty: 60 | Fill #0

## 2019-09-18 MED FILL — CARVEDILOL 3.125 MG TABLET: 3.125 | 90 days supply | Qty: 180 | Fill #0

## 2019-09-18 MED FILL — FLUTICASONE PROP 50 MCG SPR: 50 | 30 days supply | Qty: 16 | Fill #0

## 2019-09-18 MED FILL — HUMULIN 70/30 VIAL: (70-30) 100 | 78 days supply | Qty: 60 | Fill #0

## 2019-09-18 MED FILL — ELIQUIS 5 MG TABLET: 5 | 90 days supply | Qty: 180 | Fill #0

## 2019-09-18 NOTE — Patient Instructions (Signed)
Buspirone tablets What is this medicine? BUSPIRONE (byoo SPYE rone) is used to treat anxiety disorders. This medicine may be used for other purposes; ask your health care provider or pharmacist if you have questions. COMMON BRAND NAME(S): BuSpar What should I tell my health care provider before I take this medicine? They need to know if you have any of these conditions:  kidney or liver disease  an unusual or allergic reaction to buspirone, other medicines, foods, dyes, or preservatives  pregnant or trying to get pregnant  breast-feeding How should I use this medicine? Take this medicine by mouth with a glass of water. Follow the directions on the prescription label. You may take this medicine with or without food. To ensure that this medicine always works the same way for you, you should take it either always with or always without food. Take your doses at regular intervals. Do not take your medicine more often than directed. Do not stop taking except on the advice of your doctor or health care professional. Talk to your pediatrician regarding the use of this medicine in children. Special care may be needed. Overdosage: If you think you have taken too much of this medicine contact a poison control center or emergency room at once. NOTE: This medicine is only for you. Do not share this medicine with others. What if I miss a dose? If you miss a dose, take it as soon as you can. If it is almost time for your next dose, take only that dose. Do not take double or extra doses. What may interact with this medicine? Do not take this medicine with any of the following medications:  linezolid  MAOIs like Carbex, Eldepryl, Marplan, Nardil, and Parnate  methylene blue  procarbazine This medicine may also interact with the following medications:  diazepam  digoxin  diltiazem  erythromycin  grapefruit juice  haloperidol  medicines for mental depression or mood problems  medicines  for seizures like carbamazepine, phenobarbital and phenytoin  nefazodone  other medications for anxiety  rifampin  ritonavir  some antifungal medicines like itraconazole, ketoconazole, and voriconazole  verapamil  warfarin This list may not describe all possible interactions. Give your health care provider a list of all the medicines, herbs, non-prescription drugs, or dietary supplements you use. Also tell them if you smoke, drink alcohol, or use illegal drugs. Some items may interact with your medicine. What should I watch for while using this medicine? Visit your doctor or health care professional for regular checks on your progress. It may take 1 to 2 weeks before your anxiety gets better. You may get drowsy or dizzy. Do not drive, use machinery, or do anything that needs mental alertness until you know how this drug affects you. Do not stand or sit up quickly, especially if you are an older patient. This reduces the risk of dizzy or fainting spells. Alcohol can make you more drowsy and dizzy. Avoid alcoholic drinks. What side effects may I notice from receiving this medicine? Side effects that you should report to your doctor or health care professional as soon as possible:  blurred vision or other vision changes  chest pain  confusion  difficulty breathing  feelings of hostility or anger  muscle aches and pains  numbness or tingling in hands or feet  ringing in the ears  skin rash and itching  vomiting  weakness Side effects that usually do not require medical attention (report to your doctor or health care professional if they continue or   are bothersome):  disturbed dreams, nightmares  headache  nausea  restlessness or nervousness  sore throat and nasal congestion  stomach upset This list may not describe all possible side effects. Call your doctor for medical advice about side effects. You may report side effects to FDA at 1-800-FDA-1088. Where should I  keep my medicine? Keep out of the reach of children. Store at room temperature below 30 degrees C (86 degrees F). Protect from light. Keep container tightly closed. Throw away any unused medicine after the expiration date. NOTE: This sheet is a summary. It may not cover all possible information. If you have questions about this medicine, talk to your doctor, pharmacist, or health care provider.  2020 Elsevier/Gold Standard (2010-05-01 18:06:11) Generalized Anxiety Disorder, Adult Generalized anxiety disorder (GAD) is a mental health disorder. People with this condition constantly worry about everyday events. Unlike normal anxiety, worry related to GAD is not triggered by a specific event. These worries also do not fade or get better with time. GAD interferes with life functions, including relationships, work, and school. GAD can vary from mild to severe. People with severe GAD can have intense waves of anxiety with physical symptoms (panic attacks). What are the causes? The exact cause of GAD is not known. What increases the risk? This condition is more likely to develop in:  Women.  People who have a family history of anxiety disorders.  People who are very shy.  People who experience very stressful life events, such as the death of a loved one.  People who have a very stressful family environment. What are the signs or symptoms? People with GAD often worry excessively about many things in their lives, such as their health and family. They may also be overly concerned about:  Doing well at work.  Being on time.  Natural disasters.  Friendships. Physical symptoms of GAD include:  Fatigue.  Muscle tension or having muscle twitches.  Trembling or feeling shaky.  Being easily startled.  Feeling like your heart is pounding or racing.  Feeling out of breath or like you cannot take a deep breath.  Having trouble falling asleep or staying asleep.  Sweating.  Nausea,  diarrhea, or irritable bowel syndrome (IBS).  Headaches.  Trouble concentrating or remembering facts.  Restlessness.  Irritability. How is this diagnosed? Your health care provider can diagnose GAD based on your symptoms and medical history. You will also have a physical exam. The health care provider will ask specific questions about your symptoms, including how severe they are, when they started, and if they come and go. Your health care provider may ask you about your use of alcohol or drugs, including prescription medicines. Your health care provider may refer you to a mental health specialist for further evaluation. Your health care provider will do a thorough examination and may perform additional tests to rule out other possible causes of your symptoms. To be diagnosed with GAD, a person must have anxiety that:  Is out of his or her control.  Affects several different aspects of his or her life, such as work and relationships.  Causes distress that makes him or her unable to take part in normal activities.  Includes at least three physical symptoms of GAD, such as restlessness, fatigue, trouble concentrating, irritability, muscle tension, or sleep problems. Before your health care provider can confirm a diagnosis of GAD, these symptoms must be present more days than they are not, and they must last for six months or longer. How  is this treated? The following therapies are usually used to treat GAD:  Medicine. Antidepressant medicine is usually prescribed for long-term daily control. Antianxiety medicines may be added in severe cases, especially when panic attacks occur.  Talk therapy (psychotherapy). Certain types of talk therapy can be helpful in treating GAD by providing support, education, and guidance. Options include: ? Cognitive behavioral therapy (CBT). People learn coping skills and techniques to ease their anxiety. They learn to identify unrealistic or negative thoughts  and behaviors and to replace them with positive ones. ? Acceptance and commitment therapy (ACT). This treatment teaches people how to be mindful as a way to cope with unwanted thoughts and feelings. ? Biofeedback. This process trains you to manage your body's response (physiological response) through breathing techniques and relaxation methods. You will work with a therapist while machines are used to monitor your physical symptoms.  Stress management techniques. These include yoga, meditation, and exercise. A mental health specialist can help determine which treatment is best for you. Some people see improvement with one type of therapy. However, other people require a combination of therapies. Follow these instructions at home:  Take over-the-counter and prescription medicines only as told by your health care provider.  Try to maintain a normal routine.  Try to anticipate stressful situations and allow extra time to manage them.  Practice any stress management or self-calming techniques as taught by your health care provider.  Do not punish yourself for setbacks or for not making progress.  Try to recognize your accomplishments, even if they are small.  Keep all follow-up visits as told by your health care provider. This is important. Contact a health care provider if:  Your symptoms do not get better.  Your symptoms get worse.  You have signs of depression, such as: ? A persistently sad, cranky, or irritable mood. ? Loss of enjoyment in activities that used to bring you joy. ? Change in weight or eating. ? Changes in sleeping habits. ? Avoiding friends or family members. ? Loss of energy for normal tasks. ? Feelings of guilt or worthlessness. Get help right away if:  You have serious thoughts about hurting yourself or others. If you ever feel like you may hurt yourself or others, or have thoughts about taking your own life, get help right away. You can go to your nearest  emergency department or call:  Your local emergency services (911 in the [place]  A suicide crisis helpline, such as the [facility] at 409-760-9557. This is open 24 hours a day. Summary  Generalized anxiety disorder (GAD) is a mental health disorder that involves worry that is not triggered by a specific event.  People with GAD often worry excessively about many things in their lives, such as their health and family.  GAD may cause physical symptoms such as restlessness, trouble concentrating, sleep problems, frequent sweating, nausea, diarrhea, headaches, and trembling or muscle twitching.  A mental health specialist can help determine which treatment is best for you. Some people see improvement with one type of therapy. However, other people require a combination of therapies. This information is not intended to replace advice given to you by your health care provider. Make sure you discuss any questions you have with your health care provider. Document Released: 01/16/2013 Document Revised: 09/03/2017 Document Reviewed: 08/11/2016 Elsevier Patient Education  2020 [employer].

## 2019-09-18 NOTE — Progress Notes (Signed)
Patient [facility]   Urgent Care Follow Up  Subjective:  Patient ID: Jeffrey [name], male    DOB: 12/29/1975  Age: 43 y.o. MRN: [id number]  CC:  Chief Complaint  Patient presents with   Follow-up    DM    HPI Jeffrey [name] is a 43 year old male who presents for Urgent Care Follow Up today.   Past Medical History:  Diagnosis Date   Anxiety    Aspirin allergy    Childhood asthma    Chronic systolic CHF (congestive heart failure) ([place])    a. EF 20-25% in 2012. b. EF 45-50% in 10/2011 with nonischemic nuc - presumed NICM. c. 12/2014 Echo: Sev depressed LV fxn, sev dil LV, mild LVH, mild MR, sev dil LA, mildly reduced RV fxn.   CKD (chronic kidney disease) stage 2, GFR 60-89 ml/min    High cholesterol    Hypertension    Morbid obesity ([place])    Nephrolithiasis    OSA on CPAP    Presumed NICM    a. 04/2014 Myoview: EF 26%, glob HK, sev glob HK, ? prior infarct;  b. Never cathed 2/2 CKD.   Renal cell carcinoma ([place])    a. s/p Rt robotic assisted partial converted to radical nephrectomy on 01/2013.   Troponin level elevated    a. 04/2014, 12/2014: felt due to CHF.   Type II diabetes mellitus ([place])    Ventricular tachycardia ([place])    a. appropriate ICD therapy 12/2017   Current Status: Since his last office visit, he is doing well with family challenges.  His most recent normal range of preprandial blood glucose levels have been between 120-125. He has seen low range of 70 and high of 220 since his last office visit. He denies fatigue, frequent urination, blurred vision, excessive hunger, excessive thirst, weight gain, weight loss, and poor wound healing. He continues to check his feet regularly. He denies visual changes, chest pain, cough, shortness of breath, heart palpitations, and falls. He has occasional headaches and dizziness with position changes. Denies severe headaches, confusion, seizures, double vision, and blurred vision,  nausea and vomiting. He denies fevers, chills, recent infections, weight loss, and night sweats. No reports of GI problems such as nausea, vomiting, diarrhea, and constipation. He has no reports of blood in stools, dysuria and hematuria. His anxiety is increased, r/t crisis and recent deaths in his family. He denies suicidal ideations, homicidal ideations, or auditory hallucinations. He denies pain today.    Past Surgical History:  Procedure Laterality Date   APPENDECTOMY  07/2004   CARDIAC CATHETERIZATION N/A 05/17/2015   Procedure: Right/Left Heart Cath and Coronary Angiography;  Surgeon: Jolaine Artist, MD;  Location: [facility];  Service: Cardiovascular;  Laterality: N/A;   EP IMPLANTABLE DEVICE N/A 05/17/2015   Procedure: SubQ ICD Implant;  Surgeon: Will Meredith Leeds, MD;  Location: [facility];  Service: Cardiovascular;  Laterality: N/A;   ROBOTIC ASSISTED LAPAROSCOPIC LYSIS OF ADHESION  01/13/2013   Procedure: ROBOTIC ASSISTED LAPAROSCOPIC LYSIS OF ADHESION EXTENSIVE;  Surgeon: Alexis Frock, MD;  Location: [facility];  Service: Urology;;   ROBOTIC ASSITED PARTIAL NEPHRECTOMY Right 01/13/2013   Procedure: ROBOTIC ASSITED PARTIAL NEPHRECTOMY CONVERTED TO ROBOTIC ASSISTED RIGHT RADICAL NEPHRECTOMY;  Surgeon: Alexis Frock, MD;  Location: [facility];  Service: Urology;  Laterality: Right;    Family History  Problem Relation Age of Onset   Hypertension Mother    Diabetes  Maternal Aunt    Heart attack Neg Hx    Stroke Neg Hx     Social History   Socioeconomic History   Marital status: Significant Other    Spouse name: Not on file   Number of children: Not on file   Years of education: Not on file   Highest education level: Not on file  Occupational History   Not on file  Tobacco Use   Smoking status: Former Smoker    Packs/day: 0.25    Years: 22.00    Pack years: 5.50    Quit date: 04/16/2015    Years since quitting: 4.4   Smokeless tobacco: Never  Used  Substance and Sexual Activity   Alcohol use: No    Comment: occ    Drug use: Yes    Frequency: 14.0 times per week    Types: Marijuana    Comment: uses marijuana twice a day   Sexual activity: Yes    Birth control/protection: Condom  Other Topics Concern   Not on file  Social History Narrative   Not on file   Social Determinants of Health   Financial Resource Strain:    Difficulty of Paying Living Expenses: Not on file  Food Insecurity:    Worried About Charity fundraiser in the Last Year: Not on file   [employer] of Food in the Last Year: Not on file  Transportation Needs:    Lack of Transportation (Medical): Not on file   Lack of Transportation (Non-Medical): Not on file  Physical Activity:    Days of Exercise per Week: Not on file   Minutes of Exercise per Session: Not on file  Stress:    Feeling of Stress : Not on file  Social Connections:    Frequency of Communication with Friends and Family: Not on file   Frequency of Social Gatherings with Friends and Family: Not on file   Attends Religious Services: Not on file   Active Member of Clubs or Organizations: Not on file   Attends Archivist Meetings: Not on file   Marital Status: Not on file  Intimate Partner Violence:    Fear of Current or Ex-Partner: Not on file   Emotionally Abused: Not on file   Physically Abused: Not on file   Sexually Abused: Not on file    Outpatient Medications Prior to Visit  Medication Sig Dispense Refill   blood glucose meter kit and supplies Dispense based on patient and insurance preference. Use up to four times daily as directed. (FOR ICD-10 E10.9, E11.9). 1 each 0   Insulin Pen Needle (TRUEPLUS 5-BEVEL PEN NEEDLES) 31G X 6 MM MISC 1 Syringe by Does not apply route as needed. 100 each 12   Insulin Syringe-Needle U-100 (TRUEPLUS INSULIN SYRINGE) 31G X 5/16" 0.5 ML MISC Use to inject insulin twice daily. 100 each 11   isosorbide-hydrALAZINE  (BIDIL) 20-37.5 MG tablet Take 1 tablet by mouth 3 (three) times daily. 90 tablet 5   Magnesium Oxide 200 MG TABS Take 100 mg by mouth daily.     allopurinol (ZYLOPRIM) 100 MG tablet Take 1 tablet (100 mg total) by mouth daily. 90 tablet 3   amiodarone (PACERONE) 200 MG tablet Take 1 tablet (200 mg total) by mouth daily. 90 tablet 3   apixaban (ELIQUIS) 5 MG TABS tablet Take 1 tablet (5 mg total) by mouth 2 (two) times daily. 60 tablet 10   carvedilol (COREG) 3.125 MG tablet Take 1 tablet (3.125  mg total) by mouth 2 (two) times daily. 60 tablet 2   cetirizine (ZYRTEC) 10 MG tablet Take 1 tablet (10 mg total) by mouth daily. 30 tablet 6   fluticasone (FLONASE) 50 MCG/ACT nasal spray Place 2 sprays into both nostrils daily. 16 g 6   gabapentin (NEURONTIN) 300 MG capsule TAKE 1 CAPSULE BY MOUTH 3 TIMES DAILY. 90 capsule 3   glipiZIDE (GLUCOTROL) 10 MG tablet Take 1 tablet (10 mg total) by mouth 2 (two) times daily before a meal. 60 tablet 3   insulin aspart (NOVOLOG FLEXPEN) 100 UNIT/ML FlexPen Inject 0-15 Units into the skin 3 (three) times daily with meals. CBG 121-150: 2u; CBG 151-200: 3u; CBG 201-250: 5u; CBG 251-300: 8u; CBG 301-350: 11u; CBG 351-400:15 u; CBG>400: 15u 15 mL 1   insulin NPH-regular Human (70-30) 100 UNIT/ML injection Inject 38 Units into the skin 2 (two) times daily with a meal. 60 mL 3   meclizine (ANTIVERT) 25 MG tablet TAKE 1 TABLET (25 MG TOTAL) BY MOUTH 3 (THREE) TIMES DAILY. 30 tablet 4   metFORMIN (GLUCOPHAGE) 1000 MG tablet Take 1 tablet (1,000 mg total) by mouth 2 (two) times daily with a meal. 60 tablet 3   MITIGARE 0.6 MG CAPS TAKE 1 TABLET BY MOUTH 2 TIMES DAILY 180 capsule 3   sacubitril-valsartan (ENTRESTO) 97-103 MG Take 1 tablet by mouth 2 (two) times daily. 180 tablet 3   spironolactone (ALDACTONE) 25 MG tablet Take 0.5 tablets (12.5 mg total) by mouth daily. 45 tablet 3   torsemide (DEMADEX) 20 MG tablet TAKE 2 TABLETS (40 MG TOTAL) BY MOUTH  DAILY. 60 tablet 3   Vitamin D, Ergocalciferol, (DRISDOL) 1.25 MG (50000 UT) CAPS capsule Take 1 capsule (50,000 Units total) by mouth every 30 (thirty) days. 5 capsule 5   Accu-Chek FastClix Lancets MISC USE AS DIRECTED FOUR TIMES DAILY 102 each 0   ACCU-CHEK GUIDE test strip USE AS DIRECTED FOUR TIMES DAILY 100 strip 0   aspirin EC 81 MG tablet Take 81 mg by mouth daily.     benzonatate (TESSALON) 100 MG capsule Take 1-2 capsules (100-200 mg total) by mouth 3 (three) times daily as needed. 60 capsule 0   ondansetron (ZOFRAN) 4 MG tablet Take 1 tablet (4 mg total) by mouth every 8 (eight) hours as needed for nausea or vomiting. (Patient not taking: Reported on 09/18/2019) 20 tablet 1   promethazine-dextromethorphan (PROMETHAZINE-DM) 6.25-15 MG/5ML syrup Take 5 mLs by mouth 3 (three) times daily as needed for cough. 100 mL 0   No facility-administered medications prior to visit.    Allergies  Allergen Reactions   Aspirin Shortness Of Breath, Itching and Rash     Burning sensation (Patient reports he tolerates other NSAIDS)    Bee Venom Hives and Swelling   Lisinopril Cough   Tomato Rash    ROS Review of Systems  Constitutional: Negative.   HENT: Negative.   Eyes: Negative.   Respiratory: Negative.   Cardiovascular: Negative.   Gastrointestinal: Positive for abdominal distention.  Endocrine: Negative.   Genitourinary: Negative.   Musculoskeletal: Positive for arthralgias (generalized).  Skin: Negative.   Allergic/Immunologic: Negative.   Neurological: Positive for dizziness (occasional ) and headaches (occasional ).  Hematological: Negative.   Psychiatric/Behavioral: Negative.       Objective:    Physical Exam  Constitutional: He is oriented to person, place, and time. He appears well-developed and well-nourished.  HENT:  Head: Normocephalic and atraumatic.  Eyes: Conjunctivae are normal.  Cardiovascular:  Normal rate, regular rhythm, normal heart sounds and  intact distal pulses.  Pulmonary/Chest: Effort normal and breath sounds normal.  Abdominal: Soft. Bowel sounds are normal. He exhibits distension (occasional ).  Musculoskeletal:        General: Normal range of motion.     Cervical back: Normal range of motion and neck supple.  Neurological: He is alert and oriented to person, place, and time. He has normal reflexes.  Skin: Skin is warm and dry.  Psychiatric: He has a normal mood and affect. His behavior is normal. Judgment and thought content normal.  Nursing note and vitals reviewed.   BP 119/71    Pulse 79    Temp 97.9 F (36.6 C) (Oral)    Ht '5\' 9"'  (1.753 m)    Wt (!) 330 lb 12.8 oz (150 kg)    SpO2 97%    BMI 48.85 kg/m  Wt Readings from Last 3 Encounters:  09/18/19 (!) 330 lb 12.8 oz (150 kg)  07/19/19 (!) 310 lb (140.6 kg)  06/19/19 (!) 314 lb 9.6 oz (142.7 kg)    Health Maintenance Due  Topic Date Due   HEMOGLOBIN A1C  09/18/2019    There are no preventive care reminders to display for this patient.  Lab Results  Component Value Date   TSH 1.874 01/19/2019   Lab Results  Component Value Date   WBC 5.9 03/17/2019   HGB 12.7 (L) 03/17/2019   HCT 41.1 03/17/2019   MCV 78 (L) 03/17/2019   PLT 304 03/17/2019   Lab Results  Component Value Date   NA 139 03/17/2019   K 4.6 03/17/2019   CO2 19 (L) 03/17/2019   GLUCOSE 152 (H) 03/17/2019   BUN 25 (H) 03/17/2019   CREATININE 1.77 (H) 03/17/2019   BILITOT 0.4 03/17/2019   ALKPHOS 74 03/17/2019   AST 22 03/17/2019   ALT 25 03/17/2019   PROT 7.3 03/17/2019   ALBUMIN 4.2 03/17/2019   CALCIUM 10.0 03/17/2019   ANIONGAP 9 02/20/2019   GFR 58.42 (L) 01/22/2015   Lab Results  Component Value Date   CHOL 125 03/17/2019   Lab Results  Component Value Date   HDL 34 (L) 03/17/2019   Lab Results  Component Value Date   LDLCALC 43 03/17/2019   Lab Results  Component Value Date   TRIG 240 (H) 03/17/2019   Lab Results  Component Value Date   CHOLHDL 3.7  03/17/2019   Lab Results  Component Value Date   HGBA1C 6.8 (A) 06/19/2019   HGBA1C 6.8 06/19/2019   HGBA1C 6.8 (A) 06/19/2019   HGBA1C 6.8 06/19/2019      Assessment & Plan:   1. Anxiety We will initiate Buspar today.  - busPIRone (BUSPAR) 10 MG tablet; Take 1 tablet (10 mg total) by mouth 2 (two) times daily.  Dispense: 60 tablet; Refill: 3  2. Class 3 severe obesity due to excess calories with serious comorbidity and body mass index (BMI) of 45.0 to 49.9 in adult [facility]) Body mass index is 48.85 kg/m. Goal BMI  is <30. Encouraged efforts to reduce weight include engaging in physical activity as tolerated with goal of 150 minutes per week. Improve dietary choices and eat a meal regimen consistent with a Mediterranean or DASH diet. Reduce simple carbohydrates. Do not skip meals and eat healthy snacks throughout the day to avoid over-eating at dinner. Set a goal weight loss that is achievable for you.   3. Type 2 diabetes mellitus with stage 3  chronic kidney disease, with long-term current use of insulin, unspecified whether stage 3a or 3b CKD ([place]) - Vitamin B12 - Vitamin D, 25-hydroxy - Hemoglobin A1c  4. Hyperglycemia - Glucose (CBG) - Urinalysis Dipstick - glipiZIDE (GLUCOTROL) 10 MG tablet; Take 1 tablet (10 mg total) by mouth 2 (two) times daily before a meal.  Dispense: 60 tablet; Refill: 6 - metFORMIN (GLUCOPHAGE) 1000 MG tablet; Take 1 tablet (1,000 mg total) by mouth 2 (two) times daily with a meal.  Dispense: 60 tablet; Refill: 6  5. Hemoglobin A1C between 7% and 9% indicating borderline diabetic control - glipiZIDE (GLUCOTROL) 10 MG tablet; Take 1 tablet (10 mg total) by mouth 2 (two) times daily before a meal.  Dispense: 60 tablet; Refill: 6 - insulin NPH-regular Human (70-30) 100 UNIT/ML injection; Inject 38 Units into the skin 2 (two) times daily with a meal.  Dispense: 60 mL; Refill: 12 - metFORMIN (GLUCOPHAGE) 1000 MG tablet; Take 1 tablet (1,000 mg total) by mouth 2  (two) times daily with a meal.  Dispense: 60 tablet; Refill: 6  6. Influenza vaccine needed - Flu Vaccine QUAD 6+ mos PF IM (Fluarix Quad PF)  7. Essential hypertension The current medical regimen is effective; blood pressure is stable at 119/71 today; continue present plan and medications as prescribed. We  will continue to take medications as prescribed, to decrease high sodium intake, excessive alcohol intake, increase potassium intake, smoking cessation, and increase physical activity of at least 30 minutes of cardio activity daily. We will continue to follow Heart Healthy or DASH diet. - apixaban (ELIQUIS) 5 MG TABS tablet; Take 1 tablet (5 mg total) by mouth 2 (two) times daily.  Dispense: 60 tablet; Refill: 10 - sacubitril-valsartan (ENTRESTO) 97-103 MG; Take 1 tablet by mouth 2 (two) times daily.  Dispense: 60 tablet; Refill: 6  8. Vasectomy evaluation  9. Gouty arthritis - allopurinol (ZYLOPRIM) 100 MG tablet; Take 1 tablet (100 mg total) by mouth daily.  Dispense: 30 tablet; Refill: 6  10. Seasonal allergies - cetirizine (ZYRTEC) 10 MG tablet; Take 1 tablet (10 mg total) by mouth daily.  Dispense: 30 tablet; Refill: 6 - fluticasone (FLONASE) 50 MCG/ACT nasal spray; Place 2 sprays into both nostrils daily.  Dispense: 16 g; Refill: 6  11. Dizziness - meclizine (ANTIVERT) 25 MG tablet; Take 1 tablet (25 mg total) by mouth 3 (three) times daily.  Dispense: 30 tablet; Refill: 6  12. Chronic systolic congestive heart failure (HCC) - torsemide (DEMADEX) 20 MG tablet; Take 2 tablets (40 mg total) by mouth daily.  Dispense: 60 tablet; Refill: 6  13. Vitamin D deficiency - Vitamin D, Ergocalciferol, (DRISDOL) 1.25 MG (50000 UT) CAPS capsule; Take 1 capsule (50,000 Units total) by mouth once a week.  Dispense: 5 capsule; Refill: 6  14. Abnormal urinalysis Results are pending.  - Urine Culture  15. Follow up He will follow up in 3 months.   Meds ordered this encounter  Medications     busPIRone (BUSPAR) 10 MG tablet    Sig: Take 1 tablet (10 mg total) by mouth 2 (two) times daily.    Dispense:  60 tablet    Refill:  3   allopurinol (ZYLOPRIM) 100 MG tablet    Sig: Take 1 tablet (100 mg total) by mouth daily.    Dispense:  30 tablet    Refill:  6   amiodarone (PACERONE) 200 MG tablet    Sig: Take 1 tablet (200 mg total) by mouth daily.  Dispense:  30 tablet    Refill:  6   apixaban (ELIQUIS) 5 MG TABS tablet    Sig: Take 1 tablet (5 mg total) by mouth 2 (two) times daily.    Dispense:  60 tablet    Refill:  10   carvedilol (COREG) 3.125 MG tablet    Sig: Take 1 tablet (3.125 mg total) by mouth 2 (two) times daily.    Dispense:  60 tablet    Refill:  6   cetirizine (ZYRTEC) 10 MG tablet    Sig: Take 1 tablet (10 mg total) by mouth daily.    Dispense:  30 tablet    Refill:  6   fluticasone (FLONASE) 50 MCG/ACT nasal spray    Sig: Place 2 sprays into both nostrils daily.    Dispense:  16 g    Refill:  6   gabapentin (NEURONTIN) 300 MG capsule    Sig: Take 1 capsule (300 mg total) by mouth 3 (three) times daily.    Dispense:  90 capsule    Refill:  6   glipiZIDE (GLUCOTROL) 10 MG tablet    Sig: Take 1 tablet (10 mg total) by mouth 2 (two) times daily before a meal.    Dispense:  60 tablet    Refill:  6   insulin aspart (NOVOLOG FLEXPEN) 100 UNIT/ML FlexPen    Sig: Inject 0-15 Units into the skin 3 (three) times daily with meals. CBG 121-150: 2u; CBG 151-200: 3u; CBG 201-250: 5u; CBG 251-300: 8u; CBG 301-350: 11u; CBG 351-400:15 u; CBG>400: 15u    Dispense:  15 mL    Refill:  12   insulin NPH-regular Human (70-30) 100 UNIT/ML injection    Sig: Inject 38 Units into the skin 2 (two) times daily with a meal.    Dispense:  60 mL    Refill:  12   meclizine (ANTIVERT) 25 MG tablet    Sig: Take 1 tablet (25 mg total) by mouth 3 (three) times daily.    Dispense:  30 tablet    Refill:  6   metFORMIN (GLUCOPHAGE) 1000 MG tablet    Sig: Take 1  tablet (1,000 mg total) by mouth 2 (two) times daily with a meal.    Dispense:  60 tablet    Refill:  6   Colchicine (MITIGARE) 0.6 MG CAPS    Sig: Take 1 tablet by mouth 2 (two) times daily.    Dispense:  60 capsule    Refill:  6   sacubitril-valsartan (ENTRESTO) 97-103 MG    Sig: Take 1 tablet by mouth 2 (two) times daily.    Dispense:  60 tablet    Refill:  6    Please cancel all previous orders for current medication. Change in dosage or pill size.   spironolactone (ALDACTONE) 25 MG tablet    Sig: Take 0.5 tablets (12.5 mg total) by mouth daily.    Dispense:  45 tablet    Refill:  6   torsemide (DEMADEX) 20 MG tablet    Sig: Take 2 tablets (40 mg total) by mouth daily.    Dispense:  60 tablet    Refill:  6   Vitamin D, Ergocalciferol, (DRISDOL) 1.25 MG (50000 UT) CAPS capsule    Sig: Take 1 capsule (50,000 Units total) by mouth once a week.    Dispense:  5 capsule    Refill:  6    Orders Placed This Encounter  Procedures   Urine Culture   Flu  Vaccine QUAD 6+ mos PF IM (Fluarix Quad PF)   Vitamin B12   Vitamin D, 25-hydroxy   Hemoglobin A1c   Glucose (CBG)   Urinalysis Dipstick    Referral Orders  No referral(s) requested today    Kathe Becton,  MSN, FNP-BC [facility] [place], [place] [address] [phone]   Problem List Items Addressed This Visit      Endocrine   Type 2 diabetes mellitus with stage 3 chronic kidney disease, with long-term current use of insulin (HCC) (Chronic)   Relevant Medications   glipiZIDE (GLUCOTROL) 10 MG tablet   insulin aspart (NOVOLOG FLEXPEN) 100 UNIT/ML FlexPen   insulin NPH-regular Human (70-30) 100 UNIT/ML injection   metFORMIN (GLUCOPHAGE) 1000 MG tablet   Other Relevant Orders   Vitamin B12   Vitamin D, 25-hydroxy   Hemoglobin A1c     Musculoskeletal and Integument   Gouty arthritis (Chronic)    Relevant Medications   allopurinol (ZYLOPRIM) 100 MG tablet   Colchicine (MITIGARE) 0.6 MG CAPS     Other   Class 3 severe obesity due to excess calories with serious comorbidity and body mass index (BMI) of 45.0 to 49.9 in adult (HCC) - Primary   Relevant Medications   glipiZIDE (GLUCOTROL) 10 MG tablet   insulin aspart (NOVOLOG FLEXPEN) 100 UNIT/ML FlexPen   insulin NPH-regular Human (70-30) 100 UNIT/ML injection   metFORMIN (GLUCOPHAGE) 1000 MG tablet   Dizziness   Relevant Medications   meclizine (ANTIVERT) 25 MG tablet   Hyperglycemia   Relevant Medications   glipiZIDE (GLUCOTROL) 10 MG tablet   metFORMIN (GLUCOPHAGE) 1000 MG tablet   Other Relevant Orders   Glucose (CBG) (Completed)   Urinalysis Dipstick (Completed)   Vasectomy evaluation    Other Visit Diagnoses    Anxiety       Relevant Medications   busPIRone (BUSPAR) 10 MG tablet   Influenza vaccine needed       Relevant Orders   Flu Vaccine QUAD 6+ mos PF IM (Fluarix Quad PF) (Completed)   Essential hypertension       Relevant Medications   amiodarone (PACERONE) 200 MG tablet   apixaban (ELIQUIS) 5 MG TABS tablet   carvedilol (COREG) 3.125 MG tablet   sacubitril-valsartan (ENTRESTO) 97-103 MG   spironolactone (ALDACTONE) 25 MG tablet   torsemide (DEMADEX) 20 MG tablet   Seasonal allergies       Relevant Medications   cetirizine (ZYRTEC) 10 MG tablet   fluticasone (FLONASE) 50 MCG/ACT nasal spray   Chronic systolic congestive heart failure (HCC)       Relevant Medications   amiodarone (PACERONE) 200 MG tablet   apixaban (ELIQUIS) 5 MG TABS tablet   carvedilol (COREG) 3.125 MG tablet   sacubitril-valsartan (ENTRESTO) 97-103 MG   spironolactone (ALDACTONE) 25 MG tablet   torsemide (DEMADEX) 20 MG tablet   Vitamin D deficiency       Relevant Medications   Vitamin D, Ergocalciferol, (DRISDOL) 1.25 MG (50000 UT) CAPS capsule   Abnormal urinalysis       Relevant Orders   Urine Culture   Follow up           Meds ordered this encounter  Medications   busPIRone (BUSPAR) 10 MG tablet    Sig: Take 1 tablet (10 mg total) by mouth 2 (two) times daily.    Dispense:  60 tablet    Refill:  3  allopurinol (ZYLOPRIM) 100 MG tablet    Sig: Take 1 tablet (100 mg total) by mouth daily.    Dispense:  30 tablet    Refill:  6   amiodarone (PACERONE) 200 MG tablet    Sig: Take 1 tablet (200 mg total) by mouth daily.    Dispense:  30 tablet    Refill:  6   apixaban (ELIQUIS) 5 MG TABS tablet    Sig: Take 1 tablet (5 mg total) by mouth 2 (two) times daily.    Dispense:  60 tablet    Refill:  10   carvedilol (COREG) 3.125 MG tablet    Sig: Take 1 tablet (3.125 mg total) by mouth 2 (two) times daily.    Dispense:  60 tablet    Refill:  6   cetirizine (ZYRTEC) 10 MG tablet    Sig: Take 1 tablet (10 mg total) by mouth daily.    Dispense:  30 tablet    Refill:  6   fluticasone (FLONASE) 50 MCG/ACT nasal spray    Sig: Place 2 sprays into both nostrils daily.    Dispense:  16 g    Refill:  6   gabapentin (NEURONTIN) 300 MG capsule    Sig: Take 1 capsule (300 mg total) by mouth 3 (three) times daily.    Dispense:  90 capsule    Refill:  6   glipiZIDE (GLUCOTROL) 10 MG tablet    Sig: Take 1 tablet (10 mg total) by mouth 2 (two) times daily before a meal.    Dispense:  60 tablet    Refill:  6   insulin aspart (NOVOLOG FLEXPEN) 100 UNIT/ML FlexPen    Sig: Inject 0-15 Units into the skin 3 (three) times daily with meals. CBG 121-150: 2u; CBG 151-200: 3u; CBG 201-250: 5u; CBG 251-300: 8u; CBG 301-350: 11u; CBG 351-400:15 u; CBG>400: 15u    Dispense:  15 mL    Refill:  12   insulin NPH-regular Human (70-30) 100 UNIT/ML injection    Sig: Inject 38 Units into the skin 2 (two) times daily with a meal.    Dispense:  60 mL    Refill:  12   meclizine (ANTIVERT) 25 MG tablet    Sig: Take 1 tablet (25 mg total) by mouth 3 (three) times daily.    Dispense:  30 tablet    Refill:  6   metFORMIN  (GLUCOPHAGE) 1000 MG tablet    Sig: Take 1 tablet (1,000 mg total) by mouth 2 (two) times daily with a meal.    Dispense:  60 tablet    Refill:  6   Colchicine (MITIGARE) 0.6 MG CAPS    Sig: Take 1 tablet by mouth 2 (two) times daily.    Dispense:  60 capsule    Refill:  6   sacubitril-valsartan (ENTRESTO) 97-103 MG    Sig: Take 1 tablet by mouth 2 (two) times daily.    Dispense:  60 tablet    Refill:  6    Please cancel all previous orders for current medication. Change in dosage or pill size.   spironolactone (ALDACTONE) 25 MG tablet    Sig: Take 0.5 tablets (12.5 mg total) by mouth daily.    Dispense:  45 tablet    Refill:  6   torsemide (DEMADEX) 20 MG tablet    Sig: Take 2 tablets (40 mg total) by mouth daily.    Dispense:  60 tablet    Refill:  6  Vitamin D, Ergocalciferol, (DRISDOL) 1.25 MG (50000 UT) CAPS capsule    Sig: Take 1 capsule (50,000 Units total) by mouth once a week.    Dispense:  5 capsule    Refill:  6    Follow-up: Return in about 3 months (around 12/17/2019).    [name], FNP

## 2019-09-19 LAB — HEMOGLOBIN A1C
Est. average glucose Bld gHb Est-mCnc: 169 mg/dL
Hgb A1c MFr Bld: 7.5 % — ABNORMAL HIGH (ref 4.8–5.6)

## 2019-09-19 LAB — VITAMIN B12: Vitamin B-12: 241 pg/mL (ref 232–1245)

## 2019-09-19 LAB — VITAMIN D 25 HYDROXY (VIT D DEFICIENCY, FRACTURES): Vit D, 25-Hydroxy: 25.7 ng/mL — ABNORMAL LOW (ref 30.0–100.0)

## 2019-09-19 MED FILL — NOVOLOG FLEXPEN SYRINGE: 100 | 33 days supply | Qty: 15 | Fill #0

## 2019-09-20 LAB — URINE CULTURE: Organism ID, Bacteria: NO GROWTH

## 2019-09-21 MED FILL — ENTRESTO 97 MG-103 MG TAB: 97-103 | 30 days supply | Qty: 60 | Fill #0

## 2019-09-27 ENCOUNTER — Telehealth: Payer: Self-pay

## 2019-09-27 NOTE — Telephone Encounter (Signed)
Contacted pt to go over lab results pt is aware and doesn't have any questions or concerns 

## 2019-10-03 MED FILL — metFORMIN HCL 1000 MG TABS: 1000 | 30 days supply | Qty: 60 | Fill #0

## 2019-10-09 ENCOUNTER — Ambulatory Visit (INDEPENDENT_AMBULATORY_CARE_PROVIDER_SITE_OTHER): Payer: Medicaid Other | Admitting: *Deleted

## 2019-10-09 DIAGNOSIS — I472 Ventricular tachycardia, unspecified: Secondary | ICD-10-CM

## 2019-10-09 LAB — CUP PACEART REMOTE DEVICE CHECK
Battery Remaining Percentage: 49 %
Date Time Interrogation Session: 20210104112100
Implantable Lead Implant Date: 20160812
Implantable Lead Location: 753862
Implantable Lead Model: 3401
Implantable Pulse Generator Implant Date: 20160812
Pulse Gen Serial Number: 117162

## 2019-10-17 MED FILL — ENTRESTO 97 MG-103 MG TAB: 97-103 | 30 days supply | Qty: 60 | Fill #1

## 2019-10-19 MED FILL — ALLOPURINOL 100 MG TABLET: 100 | 90 days supply | Qty: 90 | Fill #0

## 2019-10-19 MED FILL — TORSEMIDE 20 MG TABLET: 20 | 30 days supply | Qty: 60 | Fill #3

## 2019-10-19 MED FILL — MECLIZINE 25 MG TABLET: 25 | 10 days supply | Qty: 30 | Fill #1

## 2019-10-27 DIAGNOSIS — Z3009 Encounter for other general counseling and advice on contraception: Secondary | ICD-10-CM | POA: Diagnosis not present

## 2019-11-22 ENCOUNTER — Other Ambulatory Visit (HOSPITAL_COMMUNITY): Payer: Self-pay | Admitting: Internal Medicine

## 2019-11-22 MED FILL — MECLIZINE 25 MG TABLET: 25 | 10 days supply | Qty: 30 | Fill #2

## 2019-11-22 MED FILL — GABAPENTIN 300 MG CAPSULE: 300 | 30 days supply | Qty: 90 | Fill #0

## 2019-11-22 MED FILL — metFORMIN HCL 1000 MG TABS: 1000 | 30 days supply | Qty: 60 | Fill #1

## 2019-11-27 MED FILL — busPIRone HCL 10 MG TABS: 10 | 30 days supply | Qty: 60 | Fill #1

## 2019-11-27 MED FILL — SPIRONOLACTONE 25 MG TABLET: 25 | 90 days supply | Qty: 45 | Fill #3

## 2019-12-04 DIAGNOSIS — Z9852 Vasectomy status: Secondary | ICD-10-CM

## 2019-12-04 HISTORY — DX: Vasectomy status: Z98.52

## 2019-12-07 ENCOUNTER — Other Ambulatory Visit (HOSPITAL_COMMUNITY): Payer: Self-pay | Admitting: Internal Medicine

## 2019-12-07 MED FILL — AMIODARONE HCL 200 MG TAB: 200 | 90 days supply | Qty: 90 | Fill #2

## 2019-12-07 MED FILL — TORSEMIDE 20 MG TABLET: 20 | 90 days supply | Qty: 180 | Fill #0

## 2019-12-08 ENCOUNTER — Ambulatory Visit: Payer: Medicaid Other | Admitting: Internal Medicine

## 2019-12-08 ENCOUNTER — Encounter: Payer: Self-pay | Admitting: Internal Medicine

## 2019-12-08 ENCOUNTER — Other Ambulatory Visit: Payer: Self-pay

## 2019-12-08 VITALS — BP 134/92 | HR 87 | Ht 67.0 in | Wt 330.4 lb

## 2019-12-08 DIAGNOSIS — I5022 Chronic systolic (congestive) heart failure: Secondary | ICD-10-CM | POA: Diagnosis not present

## 2019-12-08 DIAGNOSIS — I480 Paroxysmal atrial fibrillation: Secondary | ICD-10-CM

## 2019-12-08 DIAGNOSIS — I472 Ventricular tachycardia, unspecified: Secondary | ICD-10-CM

## 2019-12-08 DIAGNOSIS — Z95810 Presence of automatic (implantable) cardiac defibrillator: Secondary | ICD-10-CM | POA: Diagnosis not present

## 2019-12-08 MED FILL — BIDIL 20-37.5 MG TABS: 20-37.5 | 30 days supply | Qty: 90 | Fill #0

## 2019-12-08 NOTE — Progress Notes (Signed)
Patient Care Team: [name], FNP as PCP - General (Family Medicine) Bensimhon, Shaune Pascal, MD as PCP - Cardiology (Cardiology)   HPI  Jeffrey [name] is a 44 y.o. male seen in follow-up for S ICD implanted 8/16 in conjunction with  Dr. 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  Is morbidly obese has hypertension and is status post nephrectomy  The patient denies chest pain, shortness of breath, nocturnal dyspnea, orthopnea or peripheral edema.  There have been no palpitations, lightheadedness or syncope.   He is seen today because of the advisory regarding early battery depletion    Date Cr K Hgb TSH LFTs  6/20 1.77 4.6 12.7 1.874 22          Patient denies symptoms of GI intolerance, sun sensitivity, neurological symptoms attributable to amiodarone.    Records and Results Reviewed hospital notes  Past Medical History:  Diagnosis Date  . Anxiety   . Aspirin allergy   . Childhood asthma   . Chronic systolic CHF (congestive heart failure) ([place])    a. EF 20-25% in 2012. b. EF 45-50% in 10/2011 with nonischemic nuc - presumed NICM. c. 12/2014 Echo: Sev depressed LV fxn, sev dil LV, mild LVH, mild MR, sev dil LA, mildly reduced RV fxn.  . CKD (chronic kidney disease) stage 2, GFR 60-89 ml/min   . High cholesterol   . Hypertension   . Morbid obesity ([place])   . Nephrolithiasis   . OSA on CPAP   . Presumed NICM    a. 04/2014 Myoview: EF 26%, glob HK, sev glob HK, ? prior infarct;  b. Never cathed 2/2 CKD.  [name] Renal cell carcinoma ([place])    a. s/p Rt robotic assisted partial converted to radical nephrectomy on 01/2013.  . Troponin level elevated    a. 04/2014, 12/2014: felt due to CHF.  . Type II diabetes mellitus ([place])   . Ventricular tachycardia ([place])    a. appropriate ICD therapy 12/2017    Past Surgical History:  Procedure Laterality Date  . APPENDECTOMY  07/2004  . CARDIAC CATHETERIZATION N/A 05/17/2015   Procedure: Right/Left Heart Cath and  Coronary Angiography;  Surgeon: Jolaine Artist, MD;  Location: [place] [facility];  Service: Cardiovascular;  Laterality: N/A;  . EP IMPLANTABLE DEVICE N/A 05/17/2015   Procedure: SubQ ICD Implant;  Surgeon: Will Meredith Leeds, MD;  Location: [place] [facility];  Service: Cardiovascular;  Laterality: N/A;  . ROBOTIC ASSISTED LAPAROSCOPIC LYSIS OF ADHESION  01/13/2013   Procedure: ROBOTIC ASSISTED LAPAROSCOPIC LYSIS OF ADHESION EXTENSIVE;  Surgeon: Alexis Frock, MD;  Location: [facility];  Service: Urology;;  . ROBOTIC ASSITED PARTIAL NEPHRECTOMY Right 01/13/2013   Procedure: ROBOTIC ASSITED PARTIAL NEPHRECTOMY CONVERTED TO ROBOTIC ASSISTED RIGHT RADICAL NEPHRECTOMY;  Surgeon: Alexis Frock, MD;  Location: [facility];  Service: Urology;  Laterality: Right;    Current Outpatient Medications  Medication Sig Dispense Refill  . Accu-Chek FastClix Lancets MISC USE AS DIRECTED FOUR TIMES DAILY 102 each 0  . ACCU-CHEK GUIDE test strip USE AS DIRECTED FOUR TIMES DAILY 100 strip 0  . allopurinol (ZYLOPRIM) 100 MG tablet Take 1 tablet (100 mg total) by mouth daily. 30 tablet 6  . amiodarone (PACERONE) 200 MG tablet Take 1 tablet (200 mg total) by mouth daily. 30 tablet 6  . apixaban (ELIQUIS) 5 MG TABS tablet Take 1 tablet (5 mg total) by mouth 2 (two) times daily. 60 tablet 10  .  blood glucose meter kit and supplies Dispense based on patient and insurance preference. Use up to four times daily as directed. (FOR ICD-10 E10.9, E11.9). 1 each 0  . busPIRone (BUSPAR) 10 MG tablet Take 1 tablet (10 mg total) by mouth 2 (two) times daily. 60 tablet 3  . carvedilol (COREG) 3.125 MG tablet Take 1 tablet (3.125 mg total) by mouth 2 (two) times daily. 60 tablet 6  . cetirizine (ZYRTEC) 10 MG tablet Take 1 tablet (10 mg total) by mouth daily. 30 tablet 6  . Colchicine (MITIGARE) 0.6 MG CAPS Take 1 tablet by mouth 2 (two) times daily. 60 capsule 6  . fluticasone (FLONASE) 50 MCG/ACT nasal spray Place 2 sprays into both  nostrils daily. 16 g 6  . gabapentin (NEURONTIN) 300 MG capsule Take 1 capsule (300 mg total) by mouth 3 (three) times daily. 90 capsule 6  . glipiZIDE (GLUCOTROL) 10 MG tablet Take 1 tablet (10 mg total) by mouth 2 (two) times daily before a meal. 60 tablet 6  . insulin aspart (NOVOLOG FLEXPEN) 100 UNIT/ML FlexPen Inject 0-15 Units into the skin 3 (three) times daily with meals. CBG 121-150: 2u; CBG 151-200: 3u; CBG 201-250: 5u; CBG 251-300: 8u; CBG 301-350: 11u; CBG 351-400:15 u; CBG>400: 15u 15 mL 12  . insulin NPH-regular Human (70-30) 100 UNIT/ML injection Inject 38 Units into the skin 2 (two) times daily with a meal. 60 mL 12  . Insulin Pen Needle (TRUEPLUS 5-BEVEL PEN NEEDLES) 31G X 6 MM MISC 1 Syringe by Does not apply route as needed. 100 each 12  . Insulin Syringe-Needle U-100 (TRUEPLUS INSULIN SYRINGE) 31G X 5/16" 0.5 ML MISC Use to inject insulin twice daily. 100 each 11  . isosorbide-hydrALAZINE (BIDIL) 20-37.5 MG tablet Take 1 tablet by mouth 3 (three) times daily. Needs appt 90 tablet 3  . Magnesium Oxide 200 MG TABS Take 100 mg by mouth daily.    . meclizine (ANTIVERT) 25 MG tablet Take 1 tablet (25 mg total) by mouth 3 (three) times daily. 30 tablet 6  . metFORMIN (GLUCOPHAGE) 1000 MG tablet Take 1 tablet (1,000 mg total) by mouth 2 (two) times daily with a meal. 60 tablet 6  . sacubitril-valsartan (ENTRESTO) 97-103 MG Take 1 tablet by mouth 2 (two) times daily. 60 tablet 6  . spironolactone (ALDACTONE) 25 MG tablet Take 0.5 tablets (12.5 mg total) by mouth daily. 45 tablet 6  . torsemide (DEMADEX) 20 MG tablet Take 2 tablets (40 mg total) by mouth daily. 60 tablet 6  . Vitamin D, Ergocalciferol, (DRISDOL) 1.25 MG (50000 UT) CAPS capsule Take 1 capsule (50,000 Units total) by mouth once a week. 5 capsule 6   No current facility-administered medications for this visit.    Allergies  Allergen Reactions  . Aspirin Shortness Of Breath, Itching and Rash     Burning sensation  (Patient reports he tolerates other NSAIDS)   . Bee Venom Hives and Swelling  . Lisinopril Cough  . Tomato Rash      Review of Systems negative except from HPI and PMH  Physical Exam BP (!) 134/92   Pulse 87   Ht _0  (1.702 m)   Wt (!) 330 lb 6.4 oz (149.9 kg)   SpO2 97%   BMI 51.75 kg/m  Well developed and Morbidly obese \ in no acute distress HENT normal Neck supple with JVP-flat Clear Device pocket well healed; without hematoma or erythema.  There is no tethering  Regular rate and rhythm, no  murmur Abd-soft with active BS No Clubbing cyanosis   edema Skin-warm and dry A & Oriented  Grossly normal sensory and motor function    Assessment and  Plan  Nonischemic cardiomyopathy  Congestive heart failure-chronic-systolic-class II  Morbid obesity  Hypertension  High Risk Medication Surveillance amiodarone   ventricular tachycardia-nonsustained  ICD-subcutaneous  On advisory   Reviewed early depletion advisory Interrogation stressed, alert tones demonstrated  Euvolemic continue current meds  BP elevated  willneed further follwoup   Discussed the value of the vaccine and sought to address some of the concerns as a black man  Needs amiodarone surveillance labs and have asked him and his PCP to draw on 3/15

## 2019-12-08 NOTE — Patient Instructions (Signed)
Your physician recommends that you continue on your current medications as directed. Please refer to the Current Medication list given to you today.   Your physician wants you to follow-up in: YEAR WITH DR KLEIN  You will receive a reminder letter in the mail two months in advance. If you don't receive a letter, please call our office to schedule the follow-up appointment.  

## 2019-12-14 MED FILL — ENTRESTO 97 MG-103 MG TAB: 97-103 | 30 days supply | Qty: 60 | Fill #2

## 2019-12-15 ENCOUNTER — Telehealth: Payer: Self-pay | Admitting: Family Medicine

## 2019-12-15 LAB — CUP PACEART INCLINIC DEVICE CHECK
Date Time Interrogation Session: 20210305141214
Implantable Lead Implant Date: 20160812
Implantable Lead Location: 753862
Implantable Lead Model: 3401
Implantable Pulse Generator Implant Date: 20160812
Pulse Gen Serial Number: 117162

## 2019-12-15 NOTE — Telephone Encounter (Signed)
Called pt and reminded them of appointment

## 2019-12-18 ENCOUNTER — Other Ambulatory Visit: Payer: Self-pay

## 2019-12-18 ENCOUNTER — Ambulatory Visit (INDEPENDENT_AMBULATORY_CARE_PROVIDER_SITE_OTHER): Payer: Medicaid Other | Admitting: Family Medicine

## 2019-12-18 ENCOUNTER — Encounter: Payer: Self-pay | Admitting: Family Medicine

## 2019-12-18 ENCOUNTER — Other Ambulatory Visit: Payer: Self-pay | Admitting: Family Medicine

## 2019-12-18 VITALS — BP 116/75 | HR 84 | Temp 97.9°F | Wt 326.8 lb

## 2019-12-18 DIAGNOSIS — E1122 Type 2 diabetes mellitus with diabetic chronic kidney disease: Secondary | ICD-10-CM | POA: Diagnosis not present

## 2019-12-18 DIAGNOSIS — G4700 Insomnia, unspecified: Secondary | ICD-10-CM

## 2019-12-18 DIAGNOSIS — Z6841 Body Mass Index (BMI) 40.0 and over, adult: Secondary | ICD-10-CM

## 2019-12-18 DIAGNOSIS — N183 Chronic kidney disease, stage 3 unspecified: Secondary | ICD-10-CM | POA: Diagnosis not present

## 2019-12-18 DIAGNOSIS — Z09 Encounter for follow-up examination after completed treatment for conditions other than malignant neoplasm: Secondary | ICD-10-CM | POA: Diagnosis not present

## 2019-12-18 DIAGNOSIS — R739 Hyperglycemia, unspecified: Secondary | ICD-10-CM | POA: Diagnosis not present

## 2019-12-18 DIAGNOSIS — J302 Other seasonal allergic rhinitis: Secondary | ICD-10-CM

## 2019-12-18 DIAGNOSIS — R7303 Prediabetes: Secondary | ICD-10-CM

## 2019-12-18 DIAGNOSIS — Z9852 Vasectomy status: Secondary | ICD-10-CM

## 2019-12-18 DIAGNOSIS — Z0000 Encounter for general adult medical examination without abnormal findings: Secondary | ICD-10-CM | POA: Diagnosis not present

## 2019-12-18 DIAGNOSIS — I10 Essential (primary) hypertension: Secondary | ICD-10-CM | POA: Diagnosis not present

## 2019-12-18 DIAGNOSIS — Z794 Long term (current) use of insulin: Secondary | ICD-10-CM

## 2019-12-18 DIAGNOSIS — E66813 Obesity, class 3: Secondary | ICD-10-CM

## 2019-12-18 DIAGNOSIS — F419 Anxiety disorder, unspecified: Secondary | ICD-10-CM

## 2019-12-18 DIAGNOSIS — E119 Type 2 diabetes mellitus without complications: Secondary | ICD-10-CM

## 2019-12-18 LAB — POCT GLYCOSYLATED HEMOGLOBIN (HGB A1C): Hemoglobin A1C: 7.7 % — AB (ref 4.0–5.6)

## 2019-12-18 LAB — GLUCOSE, POCT (MANUAL RESULT ENTRY): POC Glucose: 250 mg/dl — AB (ref 70–99)

## 2019-12-18 LAB — POCT URINALYSIS DIPSTICK
Bilirubin, UA: NEGATIVE
Blood, UA: NEGATIVE
Glucose, UA: NEGATIVE
Ketones, UA: NEGATIVE
Leukocytes, UA: NEGATIVE
Nitrite, UA: NEGATIVE
Protein, UA: POSITIVE — AB
Spec Grav, UA: 1.015 (ref 1.010–1.025)
Urobilinogen, UA: 0.2 E.U./dL
pH, UA: 5.5 (ref 5.0–8.0)

## 2019-12-18 MED ORDER — TRAZODONE HCL 100 MG PO TABS: 100.0000 mg | ORAL_TABLET | Freq: Every day | ORAL | 3 refills | Status: DC

## 2019-12-18 MED ORDER — BIDIL 20-37.5 MG PO TABS: 1.0000 | ORAL_TABLET | Freq: Three times a day (TID) | ORAL | 6 refills | Status: DC

## 2019-12-18 MED ORDER — ACCU-CHEK GUIDE VI STRP: ORAL_STRIP | 11 refills | Status: DC

## 2019-12-18 MED ORDER — ACCU-CHEK FASTCLIX LANCETS MISC: 11 refills | Status: DC

## 2019-12-18 MED FILL — ACCU-CHEK GUIDE TEST STRIP: 25 days supply | Qty: 100 | Fill #0

## 2019-12-18 MED FILL — ACCU-CHEK FASTCLIX LANCETS: 25 days supply | Qty: 102 | Fill #0

## 2019-12-18 MED FILL — traZODone HCL 100 MG TABS: 100 | 30 days supply | Qty: 30 | Fill #0

## 2019-12-18 NOTE — Progress Notes (Signed)
Patient [facility]    Established Patient Office Visit  Subjective:  Patient ID: Jeffrey [name], male    DOB: 27-Feb-1976  Age: 44 y.o. MRN: [id number]  CC:  Chief Complaint  Patient presents with  . Follow-up    DM    HPI Jeffrey [name] is a 44 year old male who presents for Follow Up today.   Past Medical History:  Diagnosis Date  . Anxiety   . Aspirin allergy   . Childhood asthma   . Chronic systolic CHF (congestive heart failure) ([place])    a. EF 20-25% in 2012. b. EF 45-50% in 10/2011 with nonischemic nuc - presumed NICM. c. 12/2014 Echo: Sev depressed LV fxn, sev dil LV, mild LVH, mild MR, sev dil LA, mildly reduced RV fxn.  . CKD (chronic kidney disease) stage 2, GFR 60-89 ml/min   . H/O vasectomy 12/2019  . High cholesterol   . Hypertension   . Morbid obesity ([place])   . Nephrolithiasis   . OSA on CPAP   . Presumed NICM    a. 04/2014 Myoview: EF 26%, glob HK, sev glob HK, ? prior infarct;  b. Never cathed 2/2 CKD.  [name] Renal cell carcinoma ([place])    a. s/p Rt robotic assisted partial converted to radical nephrectomy on 01/2013.  . Troponin level elevated    a. 04/2014, 12/2014: felt due to CHF.  . Type II diabetes mellitus ([place])   . Ventricular tachycardia ([place])    a. appropriate ICD therapy 12/2017   Current Status: Since his last office visit, he is doing well with no complaints. He recently underwent a Vasectomy last week. He denies fatigue, frequent urination, blurred vision, excessive hunger, excessive thirst, weight gain, weight loss, and poor wound healing. He continues to check his feet regularly. He denies visual changes, chest pain, cough, shortness of breath, heart palpitations, and falls. He has occasional headaches and dizziness with position changes. Denies severe headaches, confusion, seizures, double vision, and blurred vision, nausea and vomiting. His anxiety is mild today. He denies suicidal ideations, homicidal ideations, or  auditory hallucinations. He denies fevers, chills, fatigue, recent infections, weight loss, and night sweats. No reports of GI problems such as diarrhea, and constipation. He has no reports of blood in stools, dysuria and hematuria. He denies pain today.   Past Surgical History:  Procedure Laterality Date  . APPENDECTOMY  07/2004  . CARDIAC CATHETERIZATION N/A 05/17/2015   Procedure: Right/Left Heart Cath and Coronary Angiography;  Surgeon: Jolaine Artist, MD;  Location: [place] [facility];  Service: Cardiovascular;  Laterality: N/A;  . EP IMPLANTABLE DEVICE N/A 05/17/2015   Procedure: SubQ ICD Implant;  Surgeon: Will Meredith Leeds, MD;  Location: [place] [facility];  Service: Cardiovascular;  Laterality: N/A;  . ROBOTIC ASSISTED LAPAROSCOPIC LYSIS OF ADHESION  01/13/2013   Procedure: ROBOTIC ASSISTED LAPAROSCOPIC LYSIS OF ADHESION EXTENSIVE;  Surgeon: Alexis Frock, MD;  [facility];  Service: Urology;;  . ROBOTIC ASSITED PARTIAL NEPHRECTOMY Right 01/13/2013   Procedure: ROBOTIC ASSITED PARTIAL NEPHRECTOMY CONVERTED TO ROBOTIC ASSISTED RIGHT RADICAL NEPHRECTOMY;  Surgeon: Alexis Frock, MD;  [facility];  Service: Urology;  Laterality: Right;  [name] VASECTOMY      Family History  Problem Relation Age of Onset  . Hypertension Mother   . Diabetes Maternal Aunt   . Heart attack Neg Hx   . Stroke Neg Hx     Social History   Socioeconomic History  .  Marital status: Significant Other    Spouse name: Not on file  . Number of children: Not on file  . Years of education: Not on file  . Highest education level: Not on file  Occupational History  . Not on file  Tobacco Use  . Smoking status: Former Smoker    Packs/day: 0.25    Years: 22.00    Pack years: 5.50    Quit date: 04/16/2015    Years since quitting: 4.6  . Smokeless tobacco: Never Used  Substance and Sexual Activity  . Alcohol use: No    Comment: occ   . Drug use: Yes    Frequency: 14.0 times per week    Types:  Marijuana    Comment: uses marijuana twice a day  . Sexual activity: Yes    Birth control/protection: Condom  Other Topics Concern  . Not on file  Social History Narrative  . Not on file   Social Determinants of Health   Financial Resource Strain:   . Difficulty of Paying Living Expenses:   Food Insecurity:   . Worried About Charity fundraiser in the Last Year:   . Arboriculturist in the Last Year:   Transportation Needs:   . Film/video editor (Medical):   [name] Lack of Transportation (Non-Medical):   Physical Activity:   . Days of Exercise per Week:   . Minutes of Exercise per Session:   Stress:   . Feeling of Stress :   Social Connections:   . Frequency of Communication with Friends and Family:   . Frequency of Social Gatherings with Friends and Family:   . Attends Religious Services:   . Active Member of Clubs or Organizations:   . Attends Archivist Meetings:   [name] Marital Status:   Intimate Partner Violence:   . Fear of Current or Ex-Partner:   . Emotionally Abused:   [name] Physically Abused:   . Sexually Abused:     Outpatient Medications Prior to Visit  Medication Sig Dispense Refill  . allopurinol (ZYLOPRIM) 100 MG tablet Take 1 tablet (100 mg total) by mouth daily. 30 tablet 6  . amiodarone (PACERONE) 200 MG tablet Take 1 tablet (200 mg total) by mouth daily. 30 tablet 6  . apixaban (ELIQUIS) 5 MG TABS tablet Take 1 tablet (5 mg total) by mouth 2 (two) times daily. 60 tablet 10  . blood glucose meter kit and supplies Dispense based on patient and insurance preference. Use up to four times daily as directed. (FOR ICD-10 E10.9, E11.9). 1 each 0  . busPIRone (BUSPAR) 10 MG tablet Take 1 tablet (10 mg total) by mouth 2 (two) times daily. 60 tablet 3  . carvedilol (COREG) 3.125 MG tablet Take 1 tablet (3.125 mg total) by mouth 2 (two) times daily. 60 tablet 6  . cetirizine (ZYRTEC) 10 MG tablet Take 1 tablet (10 mg total) by mouth daily. 30 tablet 6  .  Colchicine (MITIGARE) 0.6 MG CAPS Take 1 tablet by mouth 2 (two) times daily. 60 capsule 6  . fluticasone (FLONASE) 50 MCG/ACT nasal spray Place 2 sprays into both nostrils daily. 16 g 6  . gabapentin (NEURONTIN) 300 MG capsule Take 1 capsule (300 mg total) by mouth 3 (three) times daily. 90 capsule 6  . glipiZIDE (GLUCOTROL) 10 MG tablet Take 1 tablet (10 mg total) by mouth 2 (two) times daily before a meal. 60 tablet 6  . insulin aspart (NOVOLOG FLEXPEN) 100 UNIT/ML FlexPen Inject  0-15 Units into the skin 3 (three) times daily with meals. CBG 121-150: 2u; CBG 151-200: 3u; CBG 201-250: 5u; CBG 251-300: 8u; CBG 301-350: 11u; CBG 351-400:15 u; CBG>400: 15u 15 mL 12  . insulin NPH-regular Human (70-30) 100 UNIT/ML injection Inject 38 Units into the skin 2 (two) times daily with a meal. 60 mL 12  . Insulin Pen Needle (TRUEPLUS 5-BEVEL PEN NEEDLES) 31G X 6 MM MISC 1 Syringe by Does not apply route as needed. 100 each 12  . Insulin Syringe-Needle U-100 (TRUEPLUS INSULIN SYRINGE) 31G X 5/16" 0.5 ML MISC Use to inject insulin twice daily. 100 each 11  . Magnesium Oxide 200 MG TABS Take 100 mg by mouth daily.    . meclizine (ANTIVERT) 25 MG tablet Take 1 tablet (25 mg total) by mouth 3 (three) times daily. 30 tablet 6  . metFORMIN (GLUCOPHAGE) 1000 MG tablet Take 1 tablet (1,000 mg total) by mouth 2 (two) times daily with a meal. 60 tablet 6  . sacubitril-valsartan (ENTRESTO) 97-103 MG Take 1 tablet by mouth 2 (two) times daily. 60 tablet 6  . spironolactone (ALDACTONE) 25 MG tablet Take 0.5 tablets (12.5 mg total) by mouth daily. 45 tablet 6  . torsemide (DEMADEX) 20 MG tablet Take 2 tablets (40 mg total) by mouth daily. 60 tablet 6  . Vitamin D, Ergocalciferol, (DRISDOL) 1.25 MG (50000 UT) CAPS capsule Take 1 capsule (50,000 Units total) by mouth once a week. 5 capsule 6  . Accu-Chek FastClix Lancets MISC USE AS DIRECTED FOUR TIMES DAILY 102 each 0  . ACCU-CHEK GUIDE test strip USE AS DIRECTED FOUR TIMES  DAILY 100 strip 0  . isosorbide-hydrALAZINE (BIDIL) 20-37.5 MG tablet Take 1 tablet by mouth 3 (three) times daily. Needs appt 90 tablet 3   No facility-administered medications prior to visit.    Allergies  Allergen Reactions  . Aspirin Shortness Of Breath, Itching and Rash     Burning sensation (Patient reports he tolerates other NSAIDS)   . Bee Venom Hives and Swelling  . Lisinopril Cough  . Tomato Rash    ROS Review of Systems  Constitutional: Negative.   HENT: Negative.   Eyes: Negative.   Respiratory: Negative.   Cardiovascular: Negative.   Gastrointestinal: Positive for abdominal distention.  Endocrine: Negative.   Genitourinary: Negative.   Musculoskeletal: Positive for arthralgias (generalized joint pain).  Skin: Negative.   Allergic/Immunologic: Negative.   Neurological: Positive for dizziness (occasional ) and headaches (occasional ).  Hematological: Negative.   Psychiatric/Behavioral: Negative.       Objective:    Physical Exam  Constitutional: He is oriented to person, place, and time. He appears well-developed and well-nourished.  HENT:  Head: Normocephalic and atraumatic.  Eyes: Conjunctivae are normal.  Cardiovascular: Normal rate, regular rhythm, normal heart sounds and intact distal pulses.  Pulmonary/Chest: Effort normal and breath sounds normal.  Abdominal: Soft. Bowel sounds are normal. He exhibits distension (obese).  Musculoskeletal:        General: Normal range of motion.     Cervical back: Normal range of motion and neck supple.  Neurological: He is alert and oriented to person, place, and time. He has normal reflexes.  Skin: Skin is warm and dry.  Psychiatric: He has a normal mood and affect. His behavior is normal. Judgment and thought content normal.  Nursing note and vitals reviewed.   BP 116/75   Pulse 84   Temp 97.9 F (36.6 C) (Oral)   Wt (!) 326 lb 12.8 oz (  148.2 kg)   SpO2 97%   BMI 51.18 kg/m  Wt Readings from Last 3  Encounters:  12/18/19 (!) 326 lb 12.8 oz (148.2 kg)  12/08/19 (!) 330 lb 6.4 oz (149.9 kg)  09/18/19 (!) 330 lb 12.8 oz (150 kg)     Health Maintenance Due  Topic Date Due  . FOOT EXAM  11/22/2019  . URINE MICROALBUMIN  11/22/2019    There are no preventive care reminders to display for this patient.  Lab Results  Component Value Date   TSH 2.560 12/18/2019   Lab Results  Component Value Date   WBC 5.9 03/17/2019   HGB 12.7 (L) 03/17/2019   HCT 41.1 03/17/2019   MCV 78 (L) 03/17/2019   PLT 304 03/17/2019   Lab Results  Component Value Date   NA 138 12/18/2019   K 4.3 12/18/2019   CO2 20 12/18/2019   GLUCOSE 210 (H) 12/18/2019   BUN 22 12/18/2019   CREATININE 1.43 (H) 12/18/2019   BILITOT 0.2 12/18/2019   ALKPHOS 77 12/18/2019   AST 20 12/18/2019   ALT 33 12/18/2019   PROT 7.5 12/18/2019   ALBUMIN 4.2 12/18/2019   CALCIUM 9.8 12/18/2019   ANIONGAP 9 02/20/2019   GFR 58.42 (L) 01/22/2015   Lab Results  Component Value Date   CHOL 125 03/17/2019   Lab Results  Component Value Date   HDL 34 (L) 03/17/2019   Lab Results  Component Value Date   LDLCALC 43 03/17/2019   Lab Results  Component Value Date   TRIG 240 (H) 03/17/2019   Lab Results  Component Value Date   CHOLHDL 3.7 03/17/2019   Lab Results  Component Value Date   HGBA1C 7.7 (A) 12/18/2019      Assessment & Plan:   1. Type 2 diabetes mellitus with stage 3 chronic kidney disease, with long-term current use of insulin, unspecified whether stage 3a or 3b CKD ([place]) He will continue medication as prescribed, to decrease foods/beverages high in sugars and carbs and follow Heart Healthy or DASH diet. Increase physical activity to at least 30 minutes cardio exercise daily.  - Accu-Chek FastClix Lancets MISC; USE AS DIRECTED FOUR TIMES DAILY  Dispense: 102 each; Refill: 11 - glucose blood (ACCU-CHEK GUIDE) test strip; USE AS DIRECTED FOUR TIMES DAILY  Dispense: 100 strip; Refill: 11  2.  Hemoglobin A1C between 7% and 9% indicating borderline diabetic control Hgb A1c stable at 7.7 today. Monitor.   3. Hyperglycemia  4. Essential hypertension The current medical regimen is effective; blood pressure is stable at 116/75 today; continue present plan and medications as prescribed.  - isosorbide-hydrALAZINE (BIDIL) 20-37.5 MG tablet; Take 1 tablet by mouth 3 (three) times daily. Needs appt  Dispense: 90 tablet; Refill: 6  5. Class 3 severe obesity due to excess calories with serious comorbidity and body mass index (BMI) of 50.0 to 59.9 in adult [facility]) Body mass index is 51.18 kg/m. Goal BMI  is <30. Encouraged efforts to reduce weight include engaging in physical activity as tolerated with goal of 150 minutes per week. Improve dietary choices and eat a meal regimen consistent with a Mediterranean or DASH diet. Reduce simple carbohydrates. Do not skip meals and eat healthy snacks throughout the day to avoid over-eating at dinner. Set a goal weight loss that is achievable for you.  6. S/P vasectomy Tolerated procedure well.   7. History of vasectomy  8. Anxiety Stable today.  9. Seasonal allergies  10. Insomnia, unspecified type -  traZODone (DESYREL) 100 MG tablet; Take 1 tablet (100 mg total) by mouth at bedtime. For sleep  Dispense: 30 tablet; Refill: 3  11. Healthcare maintenance - POCT urinalysis dipstick - POCT glycosylated hemoglobin (Hb A1C) - POCT glucose (manual entry) - TSH - Comprehensive metabolic panel  12. Follow up He will follow up in 3 months.   Meds ordered this encounter  Medications  . Accu-Chek FastClix Lancets MISC    Sig: USE AS DIRECTED FOUR TIMES DAILY    Dispense:  102 each    Refill:  11  . glucose blood (ACCU-CHEK GUIDE) test strip    Sig: USE AS DIRECTED FOUR TIMES DAILY    Dispense:  100 strip    Refill:  11  . isosorbide-hydrALAZINE (BIDIL) 20-37.5 MG tablet    Sig: Take 1 tablet by mouth 3 (three) times daily. Needs appt     Dispense:  90 tablet    Refill:  6  . traZODone (DESYREL) 100 MG tablet    Sig: Take 1 tablet (100 mg total) by mouth at bedtime. For sleep    Dispense:  30 tablet    Refill:  3    Orders Placed This Encounter  Procedures  . TSH  . Comprehensive metabolic panel  . POCT urinalysis dipstick  . POCT glycosylated hemoglobin (Hb A1C)  . POCT glucose (manual entry)    Referral Orders  No referral(s) requested today    Kathe Becton,  MSN, FNP-BC [facility] [address] [place], [place] [address] [phone]   Problem List Items Addressed This Visit      Endocrine   Type 2 diabetes mellitus with stage 3 chronic kidney disease, with long-term current use of insulin ([place]) - Primary (Chronic)   Relevant Medications   Accu-Chek FastClix Lancets MISC   glucose blood (ACCU-CHEK GUIDE) test strip     Other   Hyperglycemia    Other Visit Diagnoses    Hemoglobin A1C between 7% and 9% indicating borderline diabetic control       Essential hypertension       Relevant Medications   isosorbide-hydrALAZINE (BIDIL) 20-37.5 MG tablet   Class 3 severe obesity due to excess calories with serious comorbidity and body mass index (BMI) of 50.0 to 59.9 in adult ([place])       S/P vasectomy       Anxiety       Relevant Medications   traZODone (DESYREL) 100 MG tablet   History of vasectomy       Seasonal allergies       Insomnia, unspecified type       Relevant Medications   traZODone (DESYREL) 100 MG tablet   Healthcare maintenance       Relevant Orders   POCT urinalysis dipstick (Completed)   POCT glycosylated hemoglobin (Hb A1C) (Completed)   POCT glucose (manual entry) (Completed)   TSH (Completed)   Comprehensive metabolic panel (Completed)   Follow up          Meds ordered this encounter  Medications  . Accu-Chek FastClix Lancets MISC    Sig: USE AS DIRECTED FOUR TIMES DAILY     Dispense:  102 each    Refill:  11  . glucose blood (ACCU-CHEK GUIDE) test strip    Sig: USE AS DIRECTED FOUR TIMES DAILY    Dispense:  100 strip    Refill:  11  . isosorbide-hydrALAZINE (BIDIL) 20-37.5 MG tablet  Sig: Take 1 tablet by mouth 3 (three) times daily. Needs appt    Dispense:  90 tablet    Refill:  6  . traZODone (DESYREL) 100 MG tablet    Sig: Take 1 tablet (100 mg total) by mouth at bedtime. For sleep    Dispense:  30 tablet    Refill:  3    Follow-up: Return in about 3 months (around 03/19/2020).    [name], FNP

## 2019-12-18 NOTE — Patient Instructions (Signed)
Trazodone tablets What is this medicine? TRAZODONE (TRAZ oh done) is used to treat depression. This medicine may be used for other purposes; ask your health care provider or pharmacist if you have questions. COMMON BRAND NAME(S): Desyrel What should I tell my health care provider before I take this medicine? They need to know if you have any of these conditions:  attempted suicide or thinking about it  bipolar disorder  bleeding problems  glaucoma  heart disease, or previous heart attack  irregular heart beat  kidney or liver disease  low levels of sodium in the blood  an unusual or allergic reaction to trazodone, other medicines, foods, dyes or preservatives  pregnant or trying to get pregnant  breast-feeding How should I use this medicine? Take this medicine by mouth with a glass of water. Follow the directions on the prescription label. Take this medicine shortly after a meal or a light snack. Take your medicine at regular intervals. Do not take your medicine more often than directed. Do not stop taking this medicine suddenly except upon the advice of your doctor. Stopping this medicine too quickly may cause serious side effects or your condition may worsen. A special MedGuide will be given to you by the pharmacist with each prescription and refill. Be sure to read this information carefully each time. Talk to your pediatrician regarding the use of this medicine in children. Special care may be needed. Overdosage: If you think you have taken too much of this medicine contact a poison control center or emergency room at once. NOTE: This medicine is only for you. Do not share this medicine with others. What if I miss a dose? If you miss a dose, take it as soon as you can. If it is almost time for your next dose, take only that dose. Do not take double or extra doses. What may interact with this medicine? Do not take this medicine with any of the following  medications:  certain medicines for fungal infections like fluconazole, itraconazole, ketoconazole, posaconazole, voriconazole  cisapride  dronedarone  linezolid  MAOIs like Carbex, Eldepryl, Marplan, Nardil, and Parnate  mesoridazine  methylene blue (injected into a vein)  pimozide  saquinavir  thioridazine This medicine may also interact with the following medications:  alcohol  antiviral medicines for HIV or AIDS  aspirin and aspirin-like medicines  barbiturates like phenobarbital  certain medicines for blood pressure, heart disease, irregular heart beat  certain medicines for depression, anxiety, or psychotic disturbances  certain medicines for migraine headache like almotriptan, eletriptan, frovatriptan, naratriptan, rizatriptan, sumatriptan, zolmitriptan  certain medicines for seizures like carbamazepine and phenytoin  certain medicines for sleep  certain medicines that treat or prevent blood clots like dalteparin, enoxaparin, warfarin  digoxin  fentanyl  lithium  NSAIDS, medicines for pain and inflammation, like ibuprofen or naproxen  other medicines that prolong the QT interval (cause an abnormal heart rhythm) like dofetilide  rasagiline  supplements like St. John's wort, kava kava, valerian  tramadol  tryptophan This list may not describe all possible interactions. Give your health care provider a list of all the medicines, herbs, non-prescription drugs, or dietary supplements you use. Also tell them if you smoke, drink alcohol, or use illegal drugs. Some items may interact with your medicine. What should I watch for while using this medicine? Tell your doctor if your symptoms do not get better or if they get worse. Visit your doctor or health care professional for regular checks on your progress. Because it may take   several weeks to see the full effects of this medicine, it is important to continue your treatment as prescribed by your  doctor. Patients and their families should watch out for new or worsening thoughts of suicide or depression. Also watch out for sudden changes in feelings such as feeling anxious, agitated, panicky, irritable, hostile, aggressive, impulsive, severely restless, overly excited and hyperactive, or not being able to sleep. If this happens, especially at the beginning of treatment or after a change in dose, call your health care professional. You may get drowsy or dizzy. Do not drive, use machinery, or do anything that needs mental alertness until you know how this medicine affects you. Do not stand or sit up quickly, especially if you are an older patient. This reduces the risk of dizzy or fainting spells. Alcohol may interfere with the effect of this medicine. Avoid alcoholic drinks. This medicine may cause dry eyes and blurred vision. If you wear contact lenses you may feel some discomfort. Lubricating drops may help. See your eye doctor if the problem does not go away or is severe. Your mouth may get dry. Chewing sugarless gum, sucking hard candy and drinking plenty of water may help. Contact your doctor if the problem does not go away or is severe. What side effects may I notice from receiving this medicine? Side effects that you should report to your doctor or health care professional as soon as possible:  allergic reactions like skin rash, itching or hives, swelling of the face, lips, or tongue  elevated mood, decreased need for sleep, racing thoughts, impulsive behavior  confusion  fast, irregular heartbeat  feeling faint or lightheaded, falls  feeling agitated, angry, or irritable  loss of balance or coordination  painful or prolonged erections  restlessness, pacing, inability to keep still  suicidal thoughts or other mood changes  tremors  trouble sleeping  seizures  unusual bleeding or bruising Side effects that usually do not require medical attention (report to your doctor  or health care professional if they continue or are bothersome):  change in sex drive or performance  change in appetite or weight  constipation  headache  muscle aches or pains  nausea This list may not describe all possible side effects. Call your doctor for medical advice about side effects. You may report side effects to FDA at 1-800-FDA-1088. Where should I keep my medicine? Keep out of the reach of children. Store at room temperature between 15 and 30 degrees C (59 to 86 degrees F). Protect from light. Keep container tightly closed. Throw away any unused medicine after the expiration date. NOTE: This sheet is a summary. It may not cover all possible information. If you have questions about this medicine, talk to your doctor, pharmacist, or health care provider.  2020 Elsevier/Gold Standard (2018-09-13 11:46:46) Insomnia Insomnia is a sleep disorder that makes it difficult to fall asleep or stay asleep. Insomnia can cause fatigue, low energy, difficulty concentrating, mood swings, and poor performance at work or school. There are three different ways to classify insomnia:  Difficulty falling asleep.  Difficulty staying asleep.  Waking up too early in the morning. Any type of insomnia can be long-term (chronic) or short-term (acute). Both are common. Short-term insomnia usually lasts for three months or less. Chronic insomnia occurs at least three times a week for longer than three months. What are the causes? Insomnia may be caused by another condition, situation, or substance, such as:  Anxiety.  Certain medicines.  Gastroesophageal reflux disease (  GERD) or other gastrointestinal conditions.  Asthma or other breathing conditions.  Restless legs syndrome, sleep apnea, or other sleep disorders.  Chronic pain.  Menopause.  Stroke.  Abuse of alcohol, tobacco, or illegal drugs.  Mental health conditions, such as depression.  Caffeine.  Neurological disorders,  such as Alzheimer's disease.  An overactive thyroid (hyperthyroidism). Sometimes, the cause of insomnia may not be known. What increases the risk? Risk factors for insomnia include:  Gender. Women are affected more often than men.  Age. Insomnia is more common as you get older.  Stress.  Lack of exercise.  Irregular work schedule or working night shifts.  Traveling between different time zones.  Certain medical and mental health conditions. What are the signs or symptoms? If you have insomnia, the main symptom is having trouble falling asleep or having trouble staying asleep. This may lead to other symptoms, such as:  Feeling fatigued or having low energy.  Feeling nervous about going to sleep.  Not feeling rested in the morning.  Having trouble concentrating.  Feeling irritable, anxious, or depressed. How is this diagnosed? This condition may be diagnosed based on:  Your symptoms and medical history. Your health care provider may ask about: ? Your sleep habits. ? Any medical conditions you have. ? Your mental health.  A physical exam. How is this treated? Treatment for insomnia depends on the cause. Treatment may focus on treating an underlying condition that is causing insomnia. Treatment may also include:  Medicines to help you sleep.  Counseling or therapy.  Lifestyle adjustments to help you sleep better. Follow these instructions at home: Eating and drinking   Limit or avoid alcohol, caffeinated beverages, and cigarettes, especially close to bedtime. These can disrupt your sleep.  Do not eat a large meal or eat spicy foods right before bedtime. This can lead to digestive discomfort that can make it hard for you to sleep. Sleep habits   Keep a sleep diary to help you and your health care provider figure out what could be causing your insomnia. Write down: ? When you sleep. ? When you wake up during the night. ? How well you sleep. ? How rested you  feel the next day. ? Any side effects of medicines you are taking. ? What you eat and drink.  Make your bedroom a dark, comfortable place where it is easy to fall asleep. ? Put up shades or blackout curtains to block light from outside. ? Use a white noise machine to block noise. ? Keep the temperature cool.  Limit screen use before bedtime. This includes: ? Watching TV. ? Using your smartphone, tablet, or computer.  Stick to a routine that includes going to bed and waking up at the same times every day and night. This can help you fall asleep faster. Consider making a quiet activity, such as reading, part of your nighttime routine.  Try to avoid taking naps during the day so that you sleep better at night.  Get out of bed if you are still awake after 15 minutes of trying to sleep. Keep the lights down, but try reading or doing a quiet activity. When you feel sleepy, go back to bed. General instructions  Take over-the-counter and prescription medicines only as told by your health care provider.  Exercise regularly, as told by your health care provider. Avoid exercise starting several hours before bedtime.  Use relaxation techniques to manage stress. Ask your health care provider to suggest some techniques that may work well   for you. These may include: ? Breathing exercises. ? Routines to release muscle tension. ? Visualizing peaceful scenes.  Make sure that you drive carefully. Avoid driving if you feel very sleepy.  Keep all follow-up visits as told by your health care provider. This is important. Contact a health care provider if:  You are tired throughout the day.  You have trouble in your daily routine due to sleepiness.  You continue to have sleep problems, or your sleep problems get worse. Get help right away if:  You have serious thoughts about hurting yourself or someone else. If you ever feel like you may hurt yourself or others, or have thoughts about taking your  own life, get help right away. You can go to your nearest emergency department or call:  Your local emergency services (911 in the [place]  A suicide crisis helpline, such as the National Suicide Prevention Lifeline at 1-800-273-8255. This is open 24 hours a day. Summary  Insomnia is a sleep disorder that makes it difficult to fall asleep or stay asleep.  Insomnia can be long-term (chronic) or short-term (acute).  Treatment for insomnia depends on the cause. Treatment may focus on treating an underlying condition that is causing insomnia.  Keep a sleep diary to help you and your health care provider figure out what could be causing your insomnia. This information is not intended to replace advice given to you by your health care provider. Make sure you discuss any questions you have with your health care provider. Document Revised: 09/03/2017 Document Reviewed: 07/01/2017 Elsevier Patient Education  2020 [employer]  

## 2019-12-19 LAB — TSH: TSH: 2.56 u[IU]/mL (ref 0.450–4.500)

## 2019-12-19 LAB — COMPREHENSIVE METABOLIC PANEL
ALT: 33 IU/L (ref 0–44)
AST: 20 IU/L (ref 0–40)
Albumin/Globulin Ratio: 1.3 (ref 1.2–2.2)
Albumin: 4.2 g/dL (ref 4.0–5.0)
Alkaline Phosphatase: 77 IU/L (ref 39–117)
BUN/Creatinine Ratio: 15 (ref 9–20)
BUN: 22 mg/dL (ref 6–24)
Bilirubin Total: 0.2 mg/dL (ref 0.0–1.2)
CO2: 20 mmol/L (ref 20–29)
Calcium: 9.8 mg/dL (ref 8.7–10.2)
Chloride: 106 mmol/L (ref 96–106)
Creatinine, Ser: 1.43 mg/dL — ABNORMAL HIGH (ref 0.76–1.27)
GFR calc Af Amer: 68 mL/min/{1.73_m2} (ref >59)
GFR calc non Af Amer: 59 mL/min/{1.73_m2} — ABNORMAL LOW (ref >59)
Globulin, Total: 3.3 g/dL (ref 1.5–4.5)
Glucose: 210 mg/dL — ABNORMAL HIGH (ref 65–99)
Potassium: 4.3 mmol/L (ref 3.5–5.2)
Sodium: 138 mmol/L (ref 134–144)
Total Protein: 7.5 g/dL (ref 6.0–8.5)

## 2019-12-20 DIAGNOSIS — E119 Type 2 diabetes mellitus without complications: Secondary | ICD-10-CM | POA: Insufficient documentation

## 2019-12-20 DIAGNOSIS — G4700 Insomnia, unspecified: Secondary | ICD-10-CM | POA: Insufficient documentation

## 2019-12-20 DIAGNOSIS — F419 Anxiety disorder, unspecified: Secondary | ICD-10-CM | POA: Insufficient documentation

## 2019-12-20 DIAGNOSIS — J302 Other seasonal allergic rhinitis: Secondary | ICD-10-CM | POA: Insufficient documentation

## 2019-12-20 DIAGNOSIS — R7303 Prediabetes: Secondary | ICD-10-CM | POA: Insufficient documentation

## 2019-12-20 DIAGNOSIS — Z9852 Vasectomy status: Secondary | ICD-10-CM | POA: Insufficient documentation

## 2020-01-03 MED FILL — GABAPENTIN 300 MG CAPSULE: 300 | 30 days supply | Qty: 90 | Fill #1

## 2020-01-03 MED FILL — MECLIZINE 25 MG TABLET: 25 | 10 days supply | Qty: 30 | Fill #3

## 2020-01-03 MED FILL — TRUEPLUS SYR 0.5ML 31GX5/16: 31G X 5/16" | 30 days supply | Qty: 100 | Fill #1

## 2020-01-08 ENCOUNTER — Ambulatory Visit (INDEPENDENT_AMBULATORY_CARE_PROVIDER_SITE_OTHER): Payer: Medicaid Other | Admitting: *Deleted

## 2020-01-08 DIAGNOSIS — I472 Ventricular tachycardia, unspecified: Secondary | ICD-10-CM

## 2020-01-08 LAB — CUP PACEART REMOTE DEVICE CHECK
Battery Remaining Percentage: 16 %
Date Time Interrogation Session: 20210405090700
Implantable Lead Implant Date: 20160812
Implantable Lead Location: 753862
Implantable Lead Model: 3401
Implantable Pulse Generator Implant Date: 20160812
Pulse Gen Serial Number: 117162

## 2020-01-09 NOTE — Progress Notes (Signed)
ICD Remote  

## 2020-02-06 MED FILL — busPIRone HCL 10 MG TABS: 10 | 30 days supply | Qty: 60 | Fill #2

## 2020-02-06 MED FILL — MECLIZINE 25 MG TABLET: 25 | 10 days supply | Qty: 30 | Fill #4

## 2020-02-06 MED FILL — metFORMIN HCL 1000 MG TABS: 1000 | 30 days supply | Qty: 60 | Fill #2

## 2020-02-19 IMAGING — DX DG CHEST 1V PORT
1 series · 1 of 1 positions shown · non-contrast
Comparison: Chest radiograph dated 06/10/2017

CLINICAL DATA: 42-year-old male with syncope.

EXAM:
PORTABLE CHEST 1 VIEW

[chest]
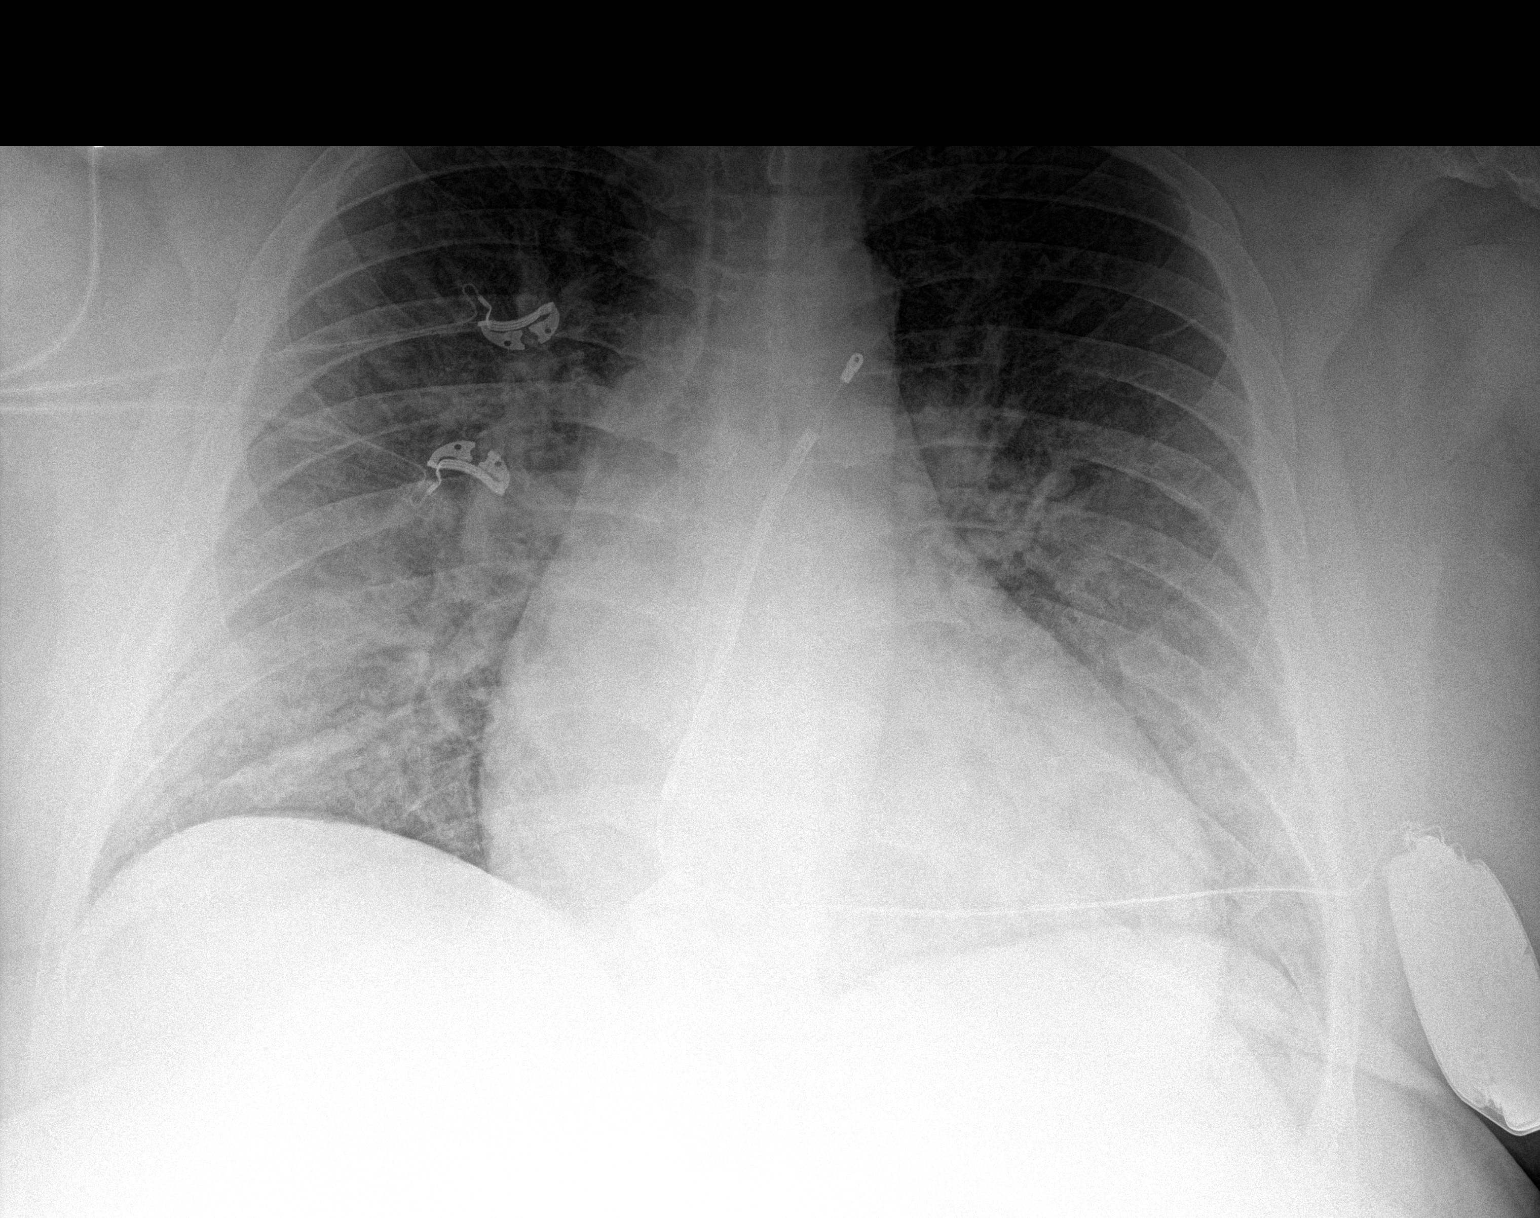

[1 of 1 positions shown; findings below may reference images not displayed]

FINDINGS: There is a stable cardiomegaly. There is mild vascular congestion.
Bilateral hazy airspace density most consistent with alveolar edema.
Pneumonia is not excluded. Clinical correlation is recommended.
There is no pleural effusion or pneumothorax. A single lead AICD
device. No acute osseous pathology.
IMPRESSION: Cardiomegaly with vascular congestion and pulmonary edema. Pneumonia
is not excluded. Clinical correlation is recommended.

## 2020-02-20 IMAGING — DX DG ANKLE 2V *R*
1 series · 2 of 2 positions shown · non-contrast
Comparison: Right ankle series of April 03, 2012

CLINICAL DATA: Status post fall yesterday. The patient is
complaining of generalized pain especially over the lateral
malleolus. Symptoms are worse today than yesterday.

EXAM:
RIGHT ANKLE - 2 VIEW

[Series 1: ankle · 0.14mm/px · 2 of 2 slices shown]
[im 1/2]
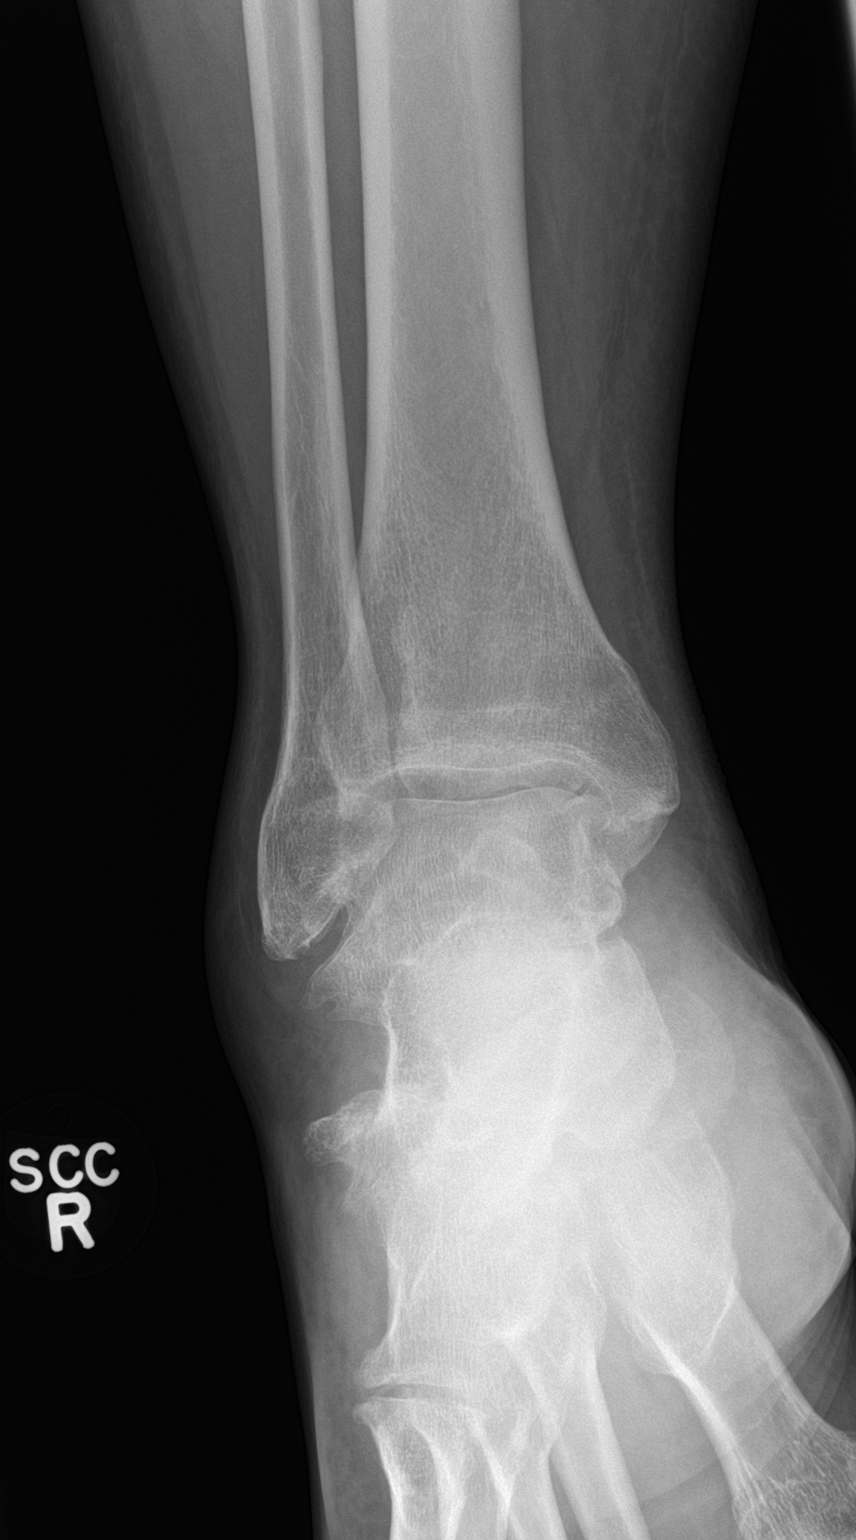
[im 2/2]
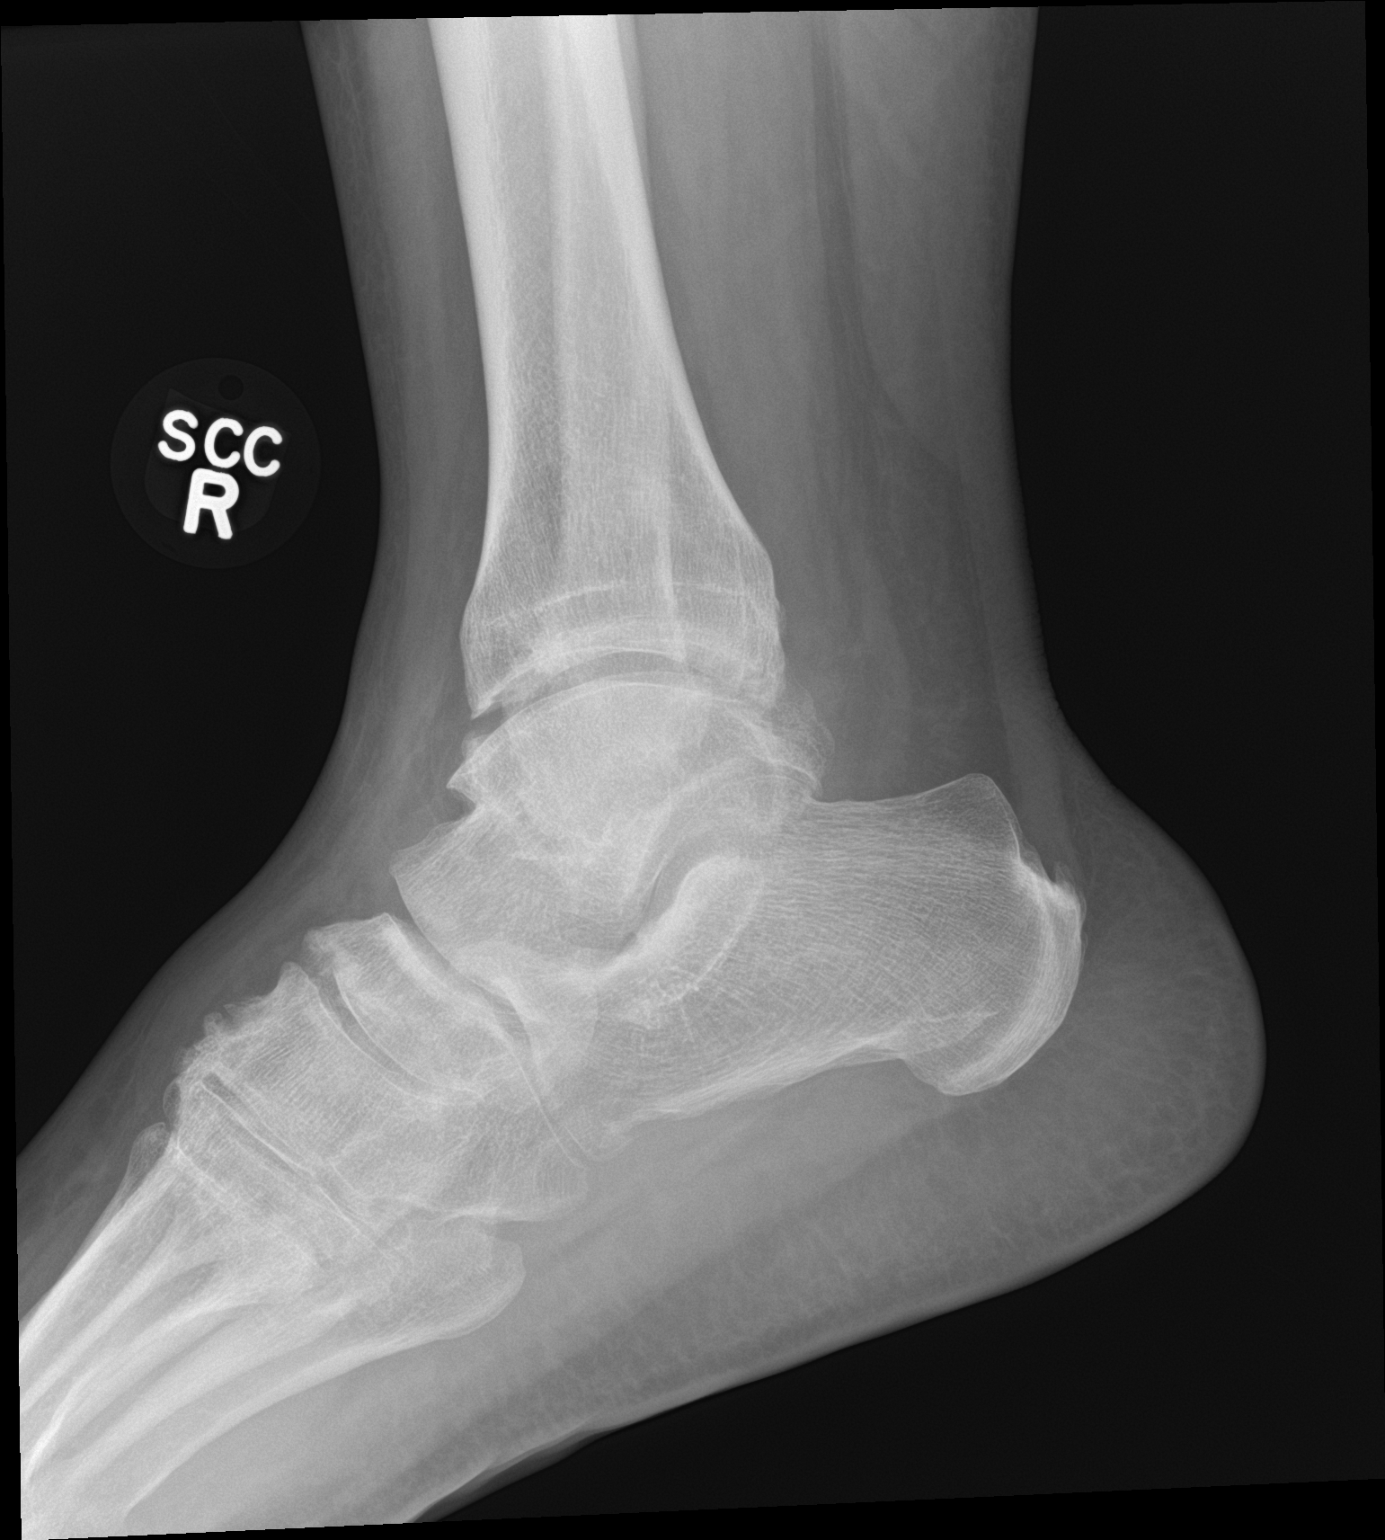

[2 of 2 positions shown; findings below may reference images not displayed]

FINDINGS: The bones are subjectively adequately mineralized. There is mild
irregularity of the tip of the lateral malleolus and of the adjacent
the talus compatible with degenerative change. No discrete fracture
is observed. The medial and posterior malleoli are unremarkable. A
small spur arises from the anterior inferior articular margin of the
tibia. The talus is intact as is the calcaneus. There is a plantar
calcaneal spur. There are degenerative changes noted along the
dorsum of the tarsal navicular and cuboid.
IMPRESSION: No definite acute fracture or dislocation of the right ankle. Mild
degenerative changes. Moderate soft tissue swelling especially over
the lateral malleolus and lateral aspect of the talus. If further
imaging is felt indicated clinically, MRI would likely be the most
useful modality.

## 2020-02-28 MED FILL — TRUEPLUS 5-BEVEL PEN NEEDLE: 31G X 5 MM | 25 days supply | Qty: 100 | Fill #1

## 2020-02-28 MED FILL — ALLOPURINOL 100 MG TABLET: 100 | 90 days supply | Qty: 90 | Fill #1

## 2020-02-28 MED FILL — BIDIL 20-37.5 MG TABS: 20-37.5 | 30 days supply | Qty: 90 | Fill #0

## 2020-02-28 MED FILL — MITIGARE 0.6 MG CAPSULE: 0.6 | 30 days supply | Qty: 60 | Fill #1

## 2020-02-28 MED FILL — GABAPENTIN 300 MG CAPSULE: 300 | 30 days supply | Qty: 90 | Fill #2

## 2020-03-15 ENCOUNTER — Other Ambulatory Visit: Payer: Self-pay | Admitting: Internal Medicine

## 2020-03-19 ENCOUNTER — Encounter: Payer: Self-pay | Admitting: Family Medicine

## 2020-03-19 ENCOUNTER — Other Ambulatory Visit: Payer: Self-pay

## 2020-03-19 ENCOUNTER — Ambulatory Visit (INDEPENDENT_AMBULATORY_CARE_PROVIDER_SITE_OTHER): Payer: Medicaid Other | Admitting: Family Medicine

## 2020-03-19 VITALS — BP 122/85 | HR 79 | Temp 97.0°F | Ht 69.0 in | Wt 335.0 lb

## 2020-03-19 DIAGNOSIS — M25542 Pain in joints of left hand: Secondary | ICD-10-CM | POA: Diagnosis not present

## 2020-03-19 DIAGNOSIS — R739 Hyperglycemia, unspecified: Secondary | ICD-10-CM | POA: Diagnosis not present

## 2020-03-19 DIAGNOSIS — G4700 Insomnia, unspecified: Secondary | ICD-10-CM | POA: Diagnosis not present

## 2020-03-19 DIAGNOSIS — Z794 Long term (current) use of insulin: Secondary | ICD-10-CM | POA: Diagnosis not present

## 2020-03-19 DIAGNOSIS — F419 Anxiety disorder, unspecified: Secondary | ICD-10-CM | POA: Diagnosis not present

## 2020-03-19 DIAGNOSIS — R7303 Prediabetes: Secondary | ICD-10-CM

## 2020-03-19 DIAGNOSIS — E66813 Obesity, class 3: Secondary | ICD-10-CM

## 2020-03-19 DIAGNOSIS — M25541 Pain in joints of right hand: Secondary | ICD-10-CM | POA: Diagnosis not present

## 2020-03-19 DIAGNOSIS — N183 Chronic kidney disease, stage 3 unspecified: Secondary | ICD-10-CM | POA: Diagnosis not present

## 2020-03-19 DIAGNOSIS — Z6841 Body Mass Index (BMI) 40.0 and over, adult: Secondary | ICD-10-CM

## 2020-03-19 DIAGNOSIS — Z09 Encounter for follow-up examination after completed treatment for conditions other than malignant neoplasm: Secondary | ICD-10-CM

## 2020-03-19 DIAGNOSIS — Z9852 Vasectomy status: Secondary | ICD-10-CM

## 2020-03-19 DIAGNOSIS — I10 Essential (primary) hypertension: Secondary | ICD-10-CM | POA: Diagnosis not present

## 2020-03-19 DIAGNOSIS — E1122 Type 2 diabetes mellitus with diabetic chronic kidney disease: Secondary | ICD-10-CM | POA: Diagnosis not present

## 2020-03-19 DIAGNOSIS — E119 Type 2 diabetes mellitus without complications: Secondary | ICD-10-CM

## 2020-03-19 LAB — POCT URINALYSIS DIPSTICK
Bilirubin, UA: NEGATIVE
Glucose, UA: NEGATIVE
Ketones, UA: NEGATIVE
Leukocytes, UA: NEGATIVE
Nitrite, UA: NEGATIVE
Protein, UA: POSITIVE — AB
Spec Grav, UA: 1.015 (ref 1.010–1.025)
Urobilinogen, UA: 0.2 E.U./dL
pH, UA: 5.5 (ref 5.0–8.0)

## 2020-03-19 LAB — POCT GLYCOSYLATED HEMOGLOBIN (HGB A1C)
HbA1c POC (<> result, manual entry): 8.1 % (ref 4.0–5.6)
HbA1c, POC (controlled diabetic range): 8.1 % — AB (ref 0.0–7.0)
HbA1c, POC (prediabetic range): 8.1 % — AB (ref 5.7–6.4)
Hemoglobin A1C: 8.1 % — AB (ref 4.0–5.6)

## 2020-03-19 LAB — GLUCOSE, POCT (MANUAL RESULT ENTRY): POC Glucose: 240 mg/dl — AB (ref 70–99)

## 2020-03-19 MED ORDER — FREESTYLE LIBRE 14 DAY SENSOR MISC: 1.0000 | 6 refills | Status: DC

## 2020-03-19 MED ORDER — FREESTYLE LIBRE 14 DAY READER DEVI: 1.0000 | Freq: Three times a day (TID) | 1 refills | Status: DC | PRN

## 2020-03-19 NOTE — Progress Notes (Signed)
Patient [facility]   Established Patient Office Visit  Subjective:  Patient ID: Jeffrey [name], male    DOB: [birth year]  Age: 44 y.o. MRN: [id number]  CC: No chief complaint on file.   HPI Jeffrey [name] is a 44 year old male who presents for Follow Up today.    Patient Active Problem List   Diagnosis Date Noted  . S/P vasectomy 12/20/2019  . Anxiety 12/20/2019  . Hemoglobin A1C between 7% and 9% indicating borderline diabetic control 12/20/2019  . Seasonal allergies 12/20/2019  . Insomnia 12/20/2019  . Vasectomy evaluation 06/20/2019  . Visual problems 06/20/2019  . Blurry vision, bilateral 06/20/2019  . Hyperglycemia 02/13/2019  . Hyperosmolar (nonketotic) coma ([place]) 01/19/2019  . AICD (automatic cardioverter/defibrillator) present 01/19/2019  . Hyperlipidemia 12/07/2018  . Follow-up exam 11/22/2018  . Class 3 severe obesity due to excess calories with serious comorbidity and body mass index (BMI) of 45.0 to 49.9 in adult ([place]) 11/22/2018  . Dizziness 11/22/2018  . Lingular pneumonia 08/04/2018  . Ventricular tachycardia ([place]) 12/30/2017  . PAF (paroxysmal atrial fibrillation) ([place])   . Dilated cardiomyopathy ([place])   . Pollen allergy 02/17/2017  . Dyslipidemia 02/17/2017  . Hematochezia 11/08/2015  . CKD (chronic kidney disease), stage III ([place]) 06/20/2015  . Cardiac defibrillator in situ   . Chronic systolic CHF (congestive heart failure) ([place]) 03/11/2015  . Gouty arthritis 03/11/2015  . Chest pain 12/28/2014  . Aspirin allergy 04/08/2014  . Renal cell carcinoma ([place])   . Gout attack 11/10/2011  . Morbid obesity ([place]) 10/23/2011  . OSA (obstructive sleep apnea) 10/23/2011  . Type 2 diabetes mellitus with stage 3 chronic kidney disease, with long-term current use of insulin ([place]) 10/22/2011  . Hyperlipidemia associated with type 2 diabetes mellitus ([place]) 12/25/2010  . Hypertension associated with diabetes ([place]) 12/25/2010     Past Medical History:  Diagnosis Date  . Anxiety   . Aspirin allergy   . Childhood asthma   . Chronic systolic CHF (congestive heart failure) ([place])    a. EF 20-25% in 2012. b. EF 45-50% in 10/2011 with nonischemic nuc - presumed NICM. c. 12/2014 Echo: Sev depressed LV fxn, sev dil LV, mild LVH, mild MR, sev dil LA, mildly reduced RV fxn.  . CKD (chronic kidney disease) stage 2, GFR 60-89 ml/min   . H/O vasectomy 12/2019  . High cholesterol   . Hypertension   . Morbid obesity ([place])   . Nephrolithiasis   . OSA on CPAP   . Presumed NICM    a. 04/2014 Myoview: EF 26%, glob HK, sev glob HK, ? prior infarct;  b. Never cathed 2/2 CKD.  [name] Renal cell carcinoma ([place])    a. s/p Rt robotic assisted partial converted to radical nephrectomy on 01/2013.  . Troponin level elevated    a. 04/2014, 12/2014: felt due to CHF.  . Type II diabetes mellitus ([place])   . Ventricular tachycardia ([place])    a. appropriate ICD therapy 12/2017   Current Status: Since his last office visit, he is doing well with no complaints. He has seen low range of 120 and high of 200 since his last office visit. He denies fatigue, frequent urination, blurred vision, excessive hunger, excessive thirst, weight gain, weight loss, and poor wound healing. He continues to check his feet regularly.  He continues to towards his weight loss goal.  He denies visual changes, chest pain, cough, shortness of breath, heart palpitations, and  falls. He has occasional headaches and dizziness with position changes. Denies severe headaches, confusion, seizures, double vision, and blurred vision, nausea and vomiting. His anxiety is mild today. He denies suicidal ideations, homicidal ideations, or auditory hallucinations. He denies fevers, recent infections, weight loss, and night sweats. Denies GI problems such as nausea, vomiting, diarrhea, and constipation. He has no reports of blood in stools, dysuria and hematuria. He is taking all medications as  prescribed. He denies pain today.   Past Surgical History:  Procedure Laterality Date  . APPENDECTOMY  07/2004  . CARDIAC CATHETERIZATION N/A 05/17/2015   Procedure: Right/Left Heart Cath and Coronary Angiography;  Surgeon: Jolaine Artist, MD;  Location: [facility];  Service: Cardiovascular;  Laterality: N/A;  . EP IMPLANTABLE DEVICE N/A 05/17/2015   Procedure: SubQ ICD Implant;  Surgeon: Will Meredith Leeds, MD;  Location: [facility];  Service: Cardiovascular;  Laterality: N/A;  . ROBOTIC ASSISTED LAPAROSCOPIC LYSIS OF ADHESION  01/13/2013   Procedure: ROBOTIC ASSISTED LAPAROSCOPIC LYSIS OF ADHESION EXTENSIVE;  Surgeon: Alexis Frock, MD;  Location: [facility];  Service: Urology;;  . ROBOTIC ASSITED PARTIAL NEPHRECTOMY Right 01/13/2013   Procedure: ROBOTIC ASSITED PARTIAL NEPHRECTOMY CONVERTED TO ROBOTIC ASSISTED RIGHT RADICAL NEPHRECTOMY;  Surgeon: Alexis Frock, MD;  Location: [facility];  Service: Urology;  Laterality: Right;  [name] VASECTOMY      Family History  Problem Relation Age of Onset  . Hypertension Mother   . Diabetes Maternal Aunt   . Heart attack Neg Hx   . Stroke Neg Hx     Social History   Socioeconomic History  . Marital status: Significant Other    Spouse name: Not on file  . Number of children: Not on file  . Years of education: Not on file  . Highest education level: Not on file  Occupational History  . Not on file  Tobacco Use  . Smoking status: Former Smoker    Packs/day: 0.25    Years: 22.00    Pack years: 5.50    Quit date: 04/16/2015    Years since quitting: 4.9  . Smokeless tobacco: Never Used  Vaping Use  . Vaping Use: Never used  Substance and Sexual Activity  . Alcohol use: No    Comment: occ   . Drug use: Yes    Frequency: 14.0 times per week    Types: Marijuana    Comment: uses marijuana twice a day  . Sexual activity: Yes    Birth control/protection: Condom  Other Topics Concern  . Not on file  Social History Narrative  .  Not on file   Social Determinants of Health   Financial Resource Strain:   . Difficulty of Paying Living Expenses:   Food Insecurity:   . Worried About Charity fundraiser in the Last Year:   . Arboriculturist in the Last Year:   Transportation Needs:   . Film/video editor (Medical):   [name] Lack of Transportation (Non-Medical):   Physical Activity:   . Days of Exercise per Week:   . Minutes of Exercise per Session:   Stress:   . Feeling of Stress :   Social Connections:   . Frequency of Communication with Friends and Family:   . Frequency of Social Gatherings with Friends and Family:   . Attends Religious Services:   . Active Member of Clubs or Organizations:   . Attends Archivist Meetings:   [name] Marital Status:   Intimate Production manager  Violence:   . Fear of Current or Ex-Partner:   . Emotionally Abused:   [name] Physically Abused:   . Sexually Abused:     Outpatient Medications Prior to Visit  Medication Sig Dispense Refill  . Accu-Chek FastClix Lancets MISC USE AS DIRECTED FOUR TIMES DAILY 102 each 11  . allopurinol (ZYLOPRIM) 100 MG tablet Take 1 tablet (100 mg total) by mouth daily. 30 tablet 6  . amiodarone (PACERONE) 200 MG tablet Take 1 tablet (200 mg total) by mouth daily. 30 tablet 6  . apixaban (ELIQUIS) 5 MG TABS tablet Take 1 tablet (5 mg total) by mouth 2 (two) times daily. 60 tablet 10  . blood glucose meter kit and supplies Dispense based on patient and insurance preference. Use up to four times daily as directed. (FOR ICD-10 E10.9, E11.9). 1 each 0  . busPIRone (BUSPAR) 10 MG tablet Take 1 tablet (10 mg total) by mouth 2 (two) times daily. 60 tablet 3  . carvedilol (COREG) 3.125 MG tablet Take 1 tablet (3.125 mg total) by mouth 2 (two) times daily. 60 tablet 6  . cetirizine (ZYRTEC) 10 MG tablet Take 1 tablet (10 mg total) by mouth daily. 30 tablet 6  . Colchicine (MITIGARE) 0.6 MG CAPS Take 1 tablet by mouth 2 (two) times daily. 60 capsule 6  . fluticasone  (FLONASE) 50 MCG/ACT nasal spray Place 2 sprays into both nostrils daily. 16 g 6  . gabapentin (NEURONTIN) 300 MG capsule Take 1 capsule (300 mg total) by mouth 3 (three) times daily. 90 capsule 6  . glipiZIDE (GLUCOTROL) 10 MG tablet Take 1 tablet (10 mg total) by mouth 2 (two) times daily before a meal. 60 tablet 6  . glucose blood (ACCU-CHEK GUIDE) test strip USE AS DIRECTED FOUR TIMES DAILY 100 strip 11  . insulin aspart (NOVOLOG FLEXPEN) 100 UNIT/ML FlexPen Inject 0-15 Units into the skin 3 (three) times daily with meals. CBG 121-150: 2u; CBG 151-200: 3u; CBG 201-250: 5u; CBG 251-300: 8u; CBG 301-350: 11u; CBG 351-400:15 u; CBG>400: 15u 15 mL 12  . insulin NPH-regular Human (70-30) 100 UNIT/ML injection Inject 38 Units into the skin 2 (two) times daily with a meal. 60 mL 12  . Insulin Pen Needle (TRUEPLUS 5-BEVEL PEN NEEDLES) 31G X 6 MM MISC 1 Syringe by Does not apply route as needed. 100 each 12  . Insulin Syringe-Needle U-100 (TRUEPLUS INSULIN SYRINGE) 31G X 5/16" 0.5 ML MISC Use to inject insulin twice daily. 100 each 11  . isosorbide-hydrALAZINE (BIDIL) 20-37.5 MG tablet Take 1 tablet by mouth 3 (three) times daily. Needs appt 90 tablet 6  . Magnesium Oxide 200 MG TABS Take 100 mg by mouth daily.    . meclizine (ANTIVERT) 25 MG tablet Take 1 tablet (25 mg total) by mouth 3 (three) times daily. 30 tablet 6  . metFORMIN (GLUCOPHAGE) 1000 MG tablet Take 1 tablet (1,000 mg total) by mouth 2 (two) times daily with a meal. 60 tablet 6  . sacubitril-valsartan (ENTRESTO) 97-103 MG Take 1 tablet by mouth 2 (two) times daily. 60 tablet 6  . spironolactone (ALDACTONE) 25 MG tablet Take 0.5 tablets (12.5 mg total) by mouth daily. 45 tablet 6  . torsemide (DEMADEX) 20 MG tablet Take 2 tablets (40 mg total) by mouth daily. 60 tablet 6  . traZODone (DESYREL) 100 MG tablet Take 1 tablet (100 mg total) by mouth at bedtime. For sleep 30 tablet 3  . Vitamin D, Ergocalciferol, (DRISDOL) 1.25 MG (50000 UT) CAPS  capsule Take 1 capsule (50,000 Units total) by mouth once a week. 5 capsule 6   No facility-administered medications prior to visit.    Allergies  Allergen Reactions  . Aspirin Shortness Of Breath, Itching and Rash     Burning sensation (Patient reports he tolerates other NSAIDS)   . Bee Venom Hives and Swelling  . Lisinopril Cough  . Tomato Rash    ROS Review of Systems  Constitutional: Negative.   HENT: Negative.   Eyes: Negative.   Respiratory: Negative.   Cardiovascular: Negative.   Gastrointestinal: Positive for abdominal distention (obese).  Endocrine: Negative.   Genitourinary: Negative.   Musculoskeletal: Positive for arthralgias (generalized joint pain).  Skin: Negative.   Allergic/Immunologic: Negative.   Neurological: Positive for dizziness (occasional ) and headaches (occasional ).  Psychiatric/Behavioral: Negative.       Objective:    Physical Exam Vitals and nursing note reviewed.  Constitutional:      Appearance: Normal appearance.  HENT:     Head: Normocephalic and atraumatic.     Nose: Nose normal.     Mouth/Throat:     Mouth: Mucous membranes are moist.  Cardiovascular:     Rate and Rhythm: Normal rate and regular rhythm.     Pulses: Normal pulses.     Heart sounds: Normal heart sounds.  Pulmonary:     Effort: Pulmonary effort is normal.     Breath sounds: Normal breath sounds.  Abdominal:     General: Bowel sounds are normal. There is distension (obese).     Palpations: Abdomen is soft.  Musculoskeletal:        General: Normal range of motion.     Cervical back: Normal range of motion and neck supple.  Skin:    General: Skin is warm and dry.  Neurological:     General: No focal deficit present.     Mental Status: He is alert and oriented to person, place, and time.  Psychiatric:        Mood and Affect: Mood normal.        Behavior: Behavior normal.        Thought Content: Thought content normal.        Judgment: Judgment normal.      BP 122/85 (BP Location: Left Arm, Patient Position: Sitting, Cuff Size: Large)   Pulse 79   Temp (!) 97 F (36.1 C)   Ht _0  (1.753 m)   Wt (!) 335 lb (152 kg)   SpO2 97%   BMI 49.47 kg/m  Wt Readings from Last 3 Encounters:  03/19/20 (!) 335 lb (152 kg)  12/18/19 (!) 326 lb 12.8 oz (148.2 kg)  12/08/19 (!) 330 lb 6.4 oz (149.9 kg)     Health Maintenance Due  Topic Date Due  . Hepatitis C Screening  Never done  . COVID-19 Vaccine (1) Never done  . FOOT EXAM  11/22/2019  . URINE MICROALBUMIN  11/22/2019  . LIPID PANEL  03/16/2020    There are no preventive care reminders to display for this patient.  Lab Results  Component Value Date   TSH 2.560 12/18/2019   Lab Results  Component Value Date   WBC 5.9 03/17/2019   HGB 12.7 (L) 03/17/2019   HCT 41.1 03/17/2019   MCV 78 (L) 03/17/2019   PLT 304 03/17/2019   Lab Results  Component Value Date   NA 138 12/18/2019   K 4.3 12/18/2019   CO2 20 12/18/2019   GLUCOSE 210 (H) 12/18/2019  BUN 22 12/18/2019   CREATININE 1.43 (H) 12/18/2019   BILITOT 0.2 12/18/2019   ALKPHOS 77 12/18/2019   AST 20 12/18/2019   ALT 33 12/18/2019   PROT 7.5 12/18/2019   ALBUMIN 4.2 12/18/2019   CALCIUM 9.8 12/18/2019   ANIONGAP 9 02/20/2019   GFR 58.42 (L) 01/22/2015   Lab Results  Component Value Date   CHOL 125 03/17/2019   Lab Results  Component Value Date   HDL 34 (L) 03/17/2019   Lab Results  Component Value Date   LDLCALC 43 03/17/2019   Lab Results  Component Value Date   TRIG 240 (H) 03/17/2019   Lab Results  Component Value Date   CHOLHDL 3.7 03/17/2019   Lab Results  Component Value Date   HGBA1C 8.1 (A) 03/19/2020   HGBA1C 8.1 03/19/2020   HGBA1C 8.1 (A) 03/19/2020   HGBA1C 8.1 (A) 03/19/2020      Assessment & Plan:   1. Type 2 diabetes mellitus with stage 3 chronic kidney disease, with long-term current use of insulin, unspecified whether stage 3a or 3b CKD ([place]) He will continue medication as  prescribed, to decrease foods/beverages high in sugars and carbs and follow Heart Healthy or DASH diet. Increase physical activity to at least 30 minutes cardio exercise daily.  - Urinalysis Dipstick - Glucose (CBG) - HgB A1c - Continuous Blood Gluc Receiver (FREESTYLE LIBRE 14 DAY READER) DEVI; 1 each by Does not apply route 3 (three) times daily between meals as needed.  Dispense: 1 each; Refill: 1 - Continuous Blood Gluc Sensor (FREESTYLE LIBRE 14 DAY SENSOR) MISC; 1 each by Does not apply route as directed.  Dispense: 1 each; Refill: 6  2. Hemoglobin A1C between 7% and 9% indicating borderline diabetic control Hgb A1c increased at 8.1 today, from 7.7 on 12/18/2019. We will continue to monitor.  - Continuous Blood Gluc Receiver (FREESTYLE LIBRE 14 DAY READER) DEVI; 1 each by Does not apply route 3 (three) times daily between meals as needed.  Dispense: 1 each; Refill: 1 - Continuous Blood Gluc Sensor (FREESTYLE LIBRE 14 DAY SENSOR) MISC; 1 each by Does not apply route as directed.  Dispense: 1 each; Refill: 6  3. Hyperglycemia  4. S/P vasectomy  5. Essential hypertension The current medical regimen is effective; blood pressure is stable at 122/85 today; continue present plan and medications as prescribed. He will continue to take medications as prescribed, to decrease high sodium intake, excessive alcohol intake, increase potassium intake, smoking cessation, and increase physical activity of at least 30 minutes of cardio activity daily. He will continue to follow Heart Healthy or DASH diet. - Urinalysis Dipstick  6. Anxiety Stable today.  7. Insomnia, unspecified type Continue Trazodone as prescribed.  8. Class 3 severe obesity due to excess calories with serious comorbidity and body mass index (BMI) of 50.0 to 59.9 in adult [facility]) Body mass index is 49.47 kg/m. Goal BMI  is <30. Encouraged efforts to reduce weight include engaging in physical activity as tolerated with goal of 150  minutes per week. Improve dietary choices and eat a meal regimen consistent with a Mediterranean or DASH diet. Reduce simple carbohydrates. Do not skip meals and eat healthy snacks throughout the day to avoid over-eating at dinner. Set a goal weight loss that is achievable for you. - Amb Referral to Bariatric Surgery  9. Arthralgia of both hands - Rheumatoid Arthritis Profile  10. Follow up He will follow up in 3 months.   Meds ordered this encounter  Medications  . Continuous Blood Gluc Receiver (FREESTYLE LIBRE 14 DAY READER) DEVI    Sig: 1 each by Does not apply route 3 (three) times daily between meals as needed.    Dispense:  1 each    Refill:  1  . Continuous Blood Gluc Sensor (FREESTYLE LIBRE 14 DAY SENSOR) MISC    Sig: 1 each by Does not apply route as directed.    Dispense:  1 each    Refill:  6    Orders Placed This Encounter  Procedures  . Rheumatoid Arthritis Profile  . Amb Referral to Bariatric Surgery  . Urinalysis Dipstick  . Glucose (CBG)  . HgB A1c     Referral Orders     Amb Referral to Bariatric Surgery   Kathe Becton,  MSN, FNP-BC [facility] [place], [place] [address] [phone]- fax   Problem List Items Addressed This Visit      Endocrine   Hemoglobin A1C between 7% and 9% indicating borderline diabetic control   Relevant Medications   Continuous Blood Gluc Receiver (FREESTYLE LIBRE 18 DAY READER) DEVI   Continuous Blood Gluc Sensor (FREESTYLE LIBRE 4 DAY SENSOR) MISC   Type 2 diabetes mellitus with stage 3 chronic kidney disease, with long-term current use of insulin ([facility]) - Primary (Chronic)   Relevant Medications   Continuous Blood Gluc Receiver (FREESTYLE LIBRE 14 DAY READER) DEVI   Continuous Blood Gluc Sensor (FREESTYLE LIBRE 14 DAY SENSOR) MISC   Other Relevant Orders   Urinalysis Dipstick (Completed)   Glucose (CBG)  (Completed)   HgB A1c (Completed)     Other   Anxiety   Hyperglycemia   Insomnia   S/P vasectomy    Other Visit Diagnoses    Essential hypertension       Relevant Orders   Urinalysis Dipstick (Completed)   Class 3 severe obesity due to excess calories with serious comorbidity and body mass index (BMI) of 50.0 to 59.9 in adult [facility])       Relevant Orders   Amb Referral to Bariatric Surgery   Arthralgia of both hands       Relevant Orders   Rheumatoid Arthritis Profile (Completed)   Follow up          Meds ordered this encounter  Medications  . Continuous Blood Gluc Receiver (FREESTYLE LIBRE 14 DAY READER) DEVI    Sig: 1 each by Does not apply route 3 (three) times daily between meals as needed.    Dispense:  1 each    Refill:  1  . Continuous Blood Gluc Sensor (FREESTYLE LIBRE 14 DAY SENSOR) MISC    Sig: 1 each by Does not apply route as directed.    Dispense:  1 each    Refill:  6    Follow-up: Return in about 3 months (around 06/19/2020).    [name], FNP

## 2020-03-21 LAB — RHEUMATOID ARTHRITIS PROFILE
Cyclic Citrullin Peptide Ab: 4 units (ref 0–19)
Rheumatoid fact SerPl-aCnc: <10 IU/mL (ref 0.0–13.9)

## 2020-03-25 ENCOUNTER — Other Ambulatory Visit: Payer: Self-pay | Admitting: Family Medicine

## 2020-03-25 DIAGNOSIS — N183 Chronic kidney disease, stage 3 unspecified: Secondary | ICD-10-CM

## 2020-03-25 DIAGNOSIS — E1122 Type 2 diabetes mellitus with diabetic chronic kidney disease: Secondary | ICD-10-CM

## 2020-03-25 MED ORDER — GUARDIAN SENSOR 3 MISC: 1.0000 | Freq: Three times a day (TID) | 6 refills | Status: DC | PRN

## 2020-03-25 NOTE — Progress Notes (Unsigned)
g

## 2020-03-26 DIAGNOSIS — G4733 Obstructive sleep apnea (adult) (pediatric): Secondary | ICD-10-CM | POA: Diagnosis not present

## 2020-04-09 ENCOUNTER — Ambulatory Visit (INDEPENDENT_AMBULATORY_CARE_PROVIDER_SITE_OTHER): Payer: Medicaid Other | Admitting: *Deleted

## 2020-04-09 DIAGNOSIS — I472 Ventricular tachycardia, unspecified: Secondary | ICD-10-CM

## 2020-04-09 LAB — CUP PACEART REMOTE DEVICE CHECK
Battery Remaining Percentage: 11 %
Date Time Interrogation Session: 20210706110200
Implantable Lead Implant Date: 20160812
Implantable Lead Location: 753862
Implantable Lead Model: 3401
Implantable Pulse Generator Implant Date: 20160812
Pulse Gen Serial Number: 117162

## 2020-04-10 NOTE — Progress Notes (Signed)
Remote ICD transmission.   

## 2020-04-16 ENCOUNTER — Telehealth: Payer: Self-pay

## 2020-04-16 MED FILL — ELIQUIS 5 MG TABLET: 5 | 90 days supply | Qty: 180 | Fill #1

## 2020-04-16 MED FILL — SPIRONOLACTONE 25 MG TABLET: 25 | 30 days supply | Qty: 15 | Fill #0

## 2020-04-16 MED FILL — busPIRone HCL 10 MG TABS: 10 | 30 days supply | Qty: 60 | Fill #3

## 2020-04-16 MED FILL — metFORMIN HCL 1000 MG TABS: 1000 | 30 days supply | Qty: 60 | Fill #3

## 2020-04-16 MED FILL — TORSEMIDE 20 MG TABLET: 20 | 90 days supply | Qty: 180 | Fill #1

## 2020-04-16 MED FILL — glipiZIDE 10 MG TABS: 10 | 90 days supply | Qty: 180 | Fill #1

## 2020-04-16 MED FILL — CARVEDILOL 3.125 MG TABLET: 3.125 | 90 days supply | Qty: 180 | Fill #1

## 2020-04-16 NOTE — Telephone Encounter (Signed)
Unscheduled transmission received- No new alerts or actie messages.  Battery noted to be 10%/ nearing ERI.    LOV 12/08/19, battery was 48% until ERI.  MD notes indicate [employer] was discussed.    Spoke with pt, advised appt needed with MD, will have scheduling contact him.  Setting up monthly checks with next check on 05/13/20.

## 2020-04-19 MED FILL — GABAPENTIN 300 MG CAPSULE: 300 | 90 days supply | Qty: 270 | Fill #3

## 2020-04-19 MED FILL — MECLIZINE 25 MG TABLET: 25 | 10 days supply | Qty: 30 | Fill #5

## 2020-04-24 DIAGNOSIS — I428 Other cardiomyopathies: Secondary | ICD-10-CM | POA: Insufficient documentation

## 2020-04-25 ENCOUNTER — Other Ambulatory Visit: Payer: Self-pay

## 2020-04-25 ENCOUNTER — Telehealth (INDEPENDENT_AMBULATORY_CARE_PROVIDER_SITE_OTHER): Payer: Medicaid Other | Admitting: Internal Medicine

## 2020-04-25 ENCOUNTER — Telehealth: Payer: Self-pay

## 2020-04-25 VITALS — Ht 67.0 in | Wt 340.0 lb

## 2020-04-25 DIAGNOSIS — I5022 Chronic systolic (congestive) heart failure: Secondary | ICD-10-CM | POA: Diagnosis not present

## 2020-04-25 DIAGNOSIS — I472 Ventricular tachycardia, unspecified: Secondary | ICD-10-CM

## 2020-04-25 DIAGNOSIS — Z95810 Presence of automatic (implantable) cardiac defibrillator: Secondary | ICD-10-CM | POA: Diagnosis not present

## 2020-04-25 DIAGNOSIS — I428 Other cardiomyopathies: Secondary | ICD-10-CM

## 2020-04-25 NOTE — Telephone Encounter (Signed)
  Patient Consent for Virtual Visit         Jeffrey [name] has provided verbal consent on 04/25/2020 for a virtual visit (video or telephone).   CONSENT FOR VIRTUAL VISIT FOR:  Jeffrey [name]  By participating in this virtual visit I agree to the following:  I hereby voluntarily request, consent and authorize [facility] and its employed or contracted physicians, physician assistants, nurse practitioners or other licensed health care professionals (the Practitioner), to provide me with telemedicine health care services (the "Services") as deemed necessary by the treating Practitioner. I acknowledge and consent to receive the Services by the Practitioner via telemedicine. I understand that the telemedicine visit will involve communicating with the Practitioner through live audiovisual communication technology and the disclosure of certain medical information by electronic transmission. I acknowledge that I have been given the opportunity to request an in-person assessment or other available alternative prior to the telemedicine visit and am voluntarily participating in the telemedicine visit.  I understand that I have the right to withhold or withdraw my consent to the use of telemedicine in the course of my care at any time, without affecting my right to future care or treatment, and that the Practitioner or I may terminate the telemedicine visit at any time. I understand that I have the right to inspect all information obtained and/or recorded in the course of the telemedicine visit and may receive copies of available information for a reasonable fee.  I understand that some of the potential risks of receiving the Services via telemedicine include:  [name] Delay or interruption in medical evaluation due to technological equipment failure or disruption; . Information transmitted may not be sufficient (e.g. poor resolution of images) to allow for appropriate medical decision making by the Practitioner;  and/or  . In rare instances, security protocols could fail, causing a breach of personal health information.  Furthermore, I acknowledge that it is my responsibility to provide information about my medical history, conditions and care that is complete and accurate to the best of my ability. I acknowledge that Practitioner's advice, recommendations, and/or decision may be based on factors not within their control, such as incomplete or inaccurate data provided by me or distortions of diagnostic images or specimens that may result from electronic transmissions. I understand that the practice of medicine is not an exact science and that Practitioner makes no warranties or guarantees regarding treatment outcomes. I acknowledge that a copy of this consent can be made available to me via my patient portal ([facility]), or I can request a printed copy by calling the office of [facility].    I understand that my insurance will be billed for this visit.   I have read or had this consent read to me. . I understand the contents of this consent, which adequately explains the benefits and risks of the Services being provided via telemedicine.  . I have been provided ample opportunity to ask questions regarding this consent and the Services and have had my questions answered to my satisfaction. . I give my informed consent for the services to be provided through the use of telemedicine in my medical care

## 2020-04-25 NOTE — H&P (View-Only) (Signed)
   Electrophysiology TeleHealth Note   Due to national recommendations of social distancing due to COVID 19, an audio/video telehealth visit is felt to be most appropriate for this patient at this time.  See MyChart message from today for the patient's consent to telehealth for [facility].   Date:  04/25/2020   ID:  Jeffrey [name], DOB 06/10/1976, MRN [id number]  Location: patient's home  Provider location: [address], [place]  Evaluation Performed: Follow-up visit  PCP:  Stroud, Natalie M, FNP  Cardiologist:     Electrophysiologist:  SK   Chief Complaint:  ICD   History of Present Illness:    Jeffrey [name] is a 44 y.o. male who presents via audio/video conferencing for a telehealth visit today.  Since last being seen in our clinic for NICM and SICD implanted 2016 for primary prevention in NICM. VT nonsustained and sustained with appropriate therapy on amiodarone, the patient is seen today because of ICD failure with early battery depletion;  pt reports doing well The patient denies chest pain ,  nocturnal dyspnea  Orthopnea  or peripheral edema  Dyspnea <<100 yds; > 1 flt of stairs  There have been no palpitations , lightheadedness  or syncope      CPAP compliant  Patient denies symptoms of GI intolerance, sun sensitivity, neurological symptoms attributable to amiodarone.    Date Cr K Hgb TSH LFTs  6/20 1.77 4.6 12.7 1.874 22  3/21  1.43 4.3  2.56 33   DATE TEST EF   8/16 LHC   Cors-- normal  8/16 Echo  20-25%   3/19 Echo  25-30%        The patient denies symptoms of fevers, chills, cough, or new SOB worrisome for COVID 19.    Past Medical History:  Diagnosis Date  . Anxiety   . Aspirin allergy   . Childhood asthma   . Chronic systolic CHF (congestive heart failure) (HCC)    a. EF 20-25% in 2012. b. EF 45-50% in 10/2011 with nonischemic nuc - presumed NICM. c. 12/2014 Echo: Sev depressed LV fxn, sev dil LV, mild LVH, mild MR, sev dil LA, mildly  reduced RV fxn.  . CKD (chronic kidney disease) stage 2, GFR 60-89 ml/min   . H/O vasectomy 12/2019  . High cholesterol   . Hypertension   . Morbid obesity (HCC)   . Nephrolithiasis   . OSA on CPAP   . Presumed NICM    a. 04/2014 Myoview: EF 26%, glob HK, sev glob HK, ? prior infarct;  b. Never cathed 2/2 CKD.  . Renal cell carcinoma (HCC)    a. s/p Rt robotic assisted partial converted to radical nephrectomy on 01/2013.  . Troponin level elevated    a. 04/2014, 12/2014: felt due to CHF.  . Type II diabetes mellitus (HCC)   . Ventricular tachycardia (HCC)    a. appropriate ICD therapy 12/2017    Past Surgical History:  Procedure Laterality Date  . APPENDECTOMY  07/2004  . CARDIAC CATHETERIZATION N/A 05/17/2015   Procedure: Right/Left Heart Cath and Coronary Angiography;  Surgeon: Daniel R Bensimhon, MD;  [facility];  Service: Cardiovascular;  Laterality: N/A;  . EP IMPLANTABLE DEVICE N/A 05/17/2015   Procedure: SubQ ICD Implant;  Surgeon: Will Martin Camnitz, MD;  [facility];  Service: Cardiovascular;  Laterality: N/A;  . ROBOTIC ASSISTED LAPAROSCOPIC LYSIS OF ADHESION  01/13/2013   Procedure: ROBOTIC ASSISTED LAPAROSCOPIC LYSIS OF   ADHESION EXTENSIVE;  Surgeon: Theodore Manny, MD;  Location: [facility];  Service: Urology;;  . ROBOTIC ASSITED PARTIAL NEPHRECTOMY Right 01/13/2013   Procedure: ROBOTIC ASSITED PARTIAL NEPHRECTOMY CONVERTED TO ROBOTIC ASSISTED RIGHT RADICAL NEPHRECTOMY;  Surgeon: Theodore Manny, MD;  Location: [facility];  Service: Urology;  Laterality: Right;  . VASECTOMY      Current Outpatient Medications  Medication Sig Dispense Refill  . Accu-Chek FastClix Lancets MISC USE AS DIRECTED FOUR TIMES DAILY 102 each 11  . allopurinol (ZYLOPRIM) 100 MG tablet Take 1 tablet (100 mg total) by mouth daily. 30 tablet 6  . amiodarone (PACERONE) 200 MG tablet Take 1 tablet (200 mg total) by mouth daily. 30 tablet 6  . apixaban (ELIQUIS) 5 MG TABS tablet  Take 1 tablet (5 mg total) by mouth 2 (two) times daily. 60 tablet 10  . blood glucose meter kit and supplies Dispense based on patient and insurance preference. Use up to four times daily as directed. (FOR ICD-10 E10.9, E11.9). 1 each 0  . busPIRone (BUSPAR) 10 MG tablet Take 1 tablet (10 mg total) by mouth 2 (two) times daily. 60 tablet 3  . carvedilol (COREG) 3.125 MG tablet Take 1 tablet (3.125 mg total) by mouth 2 (two) times daily. 60 tablet 6  . cetirizine (ZYRTEC) 10 MG tablet Take 1 tablet (10 mg total) by mouth daily. 30 tablet 6  . Colchicine (MITIGARE) 0.6 MG CAPS Take 1 tablet by mouth 2 (two) times daily. 60 capsule 6  . Continuous Blood Gluc Receiver (FREESTYLE LIBRE 14 DAY READER) DEVI 1 each by Does not apply route 3 (three) times daily between meals as needed. 1 each 1  . Continuous Blood Gluc Sensor (GUARDIAN SENSOR 3) MISC 1 Device by Does not apply route 3 (three) times daily between meals as needed. 5 each 6  . fluticasone (FLONASE) 50 MCG/ACT nasal spray Place 2 sprays into both nostrils daily. 16 g 6  . gabapentin (NEURONTIN) 300 MG capsule Take 1 capsule (300 mg total) by mouth 3 (three) times daily. 90 capsule 6  . glipiZIDE (GLUCOTROL) 10 MG tablet Take 1 tablet (10 mg total) by mouth 2 (two) times daily before a meal. 60 tablet 6  . glucose blood (ACCU-CHEK GUIDE) test strip USE AS DIRECTED FOUR TIMES DAILY 100 strip 11  . insulin aspart (NOVOLOG FLEXPEN) 100 UNIT/ML FlexPen Inject 0-15 Units into the skin 3 (three) times daily with meals. CBG 121-150: 2u; CBG 151-200: 3u; CBG 201-250: 5u; CBG 251-300: 8u; CBG 301-350: 11u; CBG 351-400:15 u; CBG>400: 15u 15 mL 12  . insulin NPH-regular Human (70-30) 100 UNIT/ML injection Inject 38 Units into the skin 2 (two) times daily with a meal. 60 mL 12  . Insulin Pen Needle (TRUEPLUS 5-BEVEL PEN NEEDLES) 31G X 6 MM MISC 1 Syringe by Does not apply route as needed. 100 each 12  . Insulin Syringe-Needle U-100 (TRUEPLUS INSULIN SYRINGE)  31G X 5/16" 0.5 ML MISC Use to inject insulin twice daily. 100 each 11  . isosorbide-hydrALAZINE (BIDIL) 20-37.5 MG tablet Take 1 tablet by mouth 3 (three) times daily. Needs appt 90 tablet 6  . Magnesium Oxide 200 MG TABS Take 100 mg by mouth daily.    . meclizine (ANTIVERT) 25 MG tablet Take 1 tablet (25 mg total) by mouth 3 (three) times daily. 30 tablet 6  . metFORMIN (GLUCOPHAGE) 1000 MG tablet Take 1 tablet (1,000 mg total) by mouth 2 (two) times daily with a meal. 60 tablet 6  .   sacubitril-valsartan (ENTRESTO) 97-103 MG Take 1 tablet by mouth 2 (two) times daily. 60 tablet 6  . spironolactone (ALDACTONE) 25 MG tablet Take 0.5 tablets (12.5 mg total) by mouth daily. 45 tablet 6  . torsemide (DEMADEX) 20 MG tablet Take 2 tablets (40 mg total) by mouth daily. 60 tablet 6  . traZODone (DESYREL) 100 MG tablet Take 1 tablet (100 mg total) by mouth at bedtime. For sleep 30 tablet 3  . Vitamin D, Ergocalciferol, (DRISDOL) 1.25 MG (50000 UT) CAPS capsule Take 1 capsule (50,000 Units total) by mouth once a week. 5 capsule 6   No current facility-administered medications for this visit.    Allergies:   Aspirin, Bee venom, Lisinopril, and Tomato   Social History:  The patient  reports that he quit smoking about 5 years ago. He has a 5.50 pack-year smoking history. He has never used smokeless tobacco. He reports current drug use. Frequency: 14.00 times per week. Drug: Marijuana. He reports that he does not drink alcohol.   Family History:  The patient's   family history includes Diabetes in his maternal aunt; Hypertension in his mother.   ROS:  Please see the history of present illness.   All other systems are personally reviewed and negative.    Exam:    Vital Signs:  Ht 5' 7" (1.702 m)   Wt (!) 340 lb (154.2 kg)   BMI 53.25 kg/m      Labs/Other Tests and Data Reviewed:    Recent Labs: 07/19/2019: B Natriuretic Peptide 31.8 12/18/2019: ALT 33; BUN 22; Creatinine, Ser 1.43; Potassium 4.3;  Sodium 138; TSH 2.560   Wt Readings from Last 3 Encounters:  04/25/20 (!) 340 lb (154.2 kg)  03/19/20 (!) 335 lb (152 kg)  12/18/19 (!) 326 lb 12.8 oz (148.2 kg)     Other studies personally reviewed: Additional studies/ records that were reviewed today include As above         Last device remote is reviewed from [employer] PDF dated 7/21 which reveals failed device function w early battery depletion   ASSESSMENT & PLAN:   Nonischemic cardiomyopathy  Congestive heart failure-chronic-systolic-class II  Morbid obesity  Hypertension  High Risk Medication Surveillance amiodarone   ventricular tachycardia-nonsustained  ICD-subcutaneous  early battery depletion /failure  Euvolemic continue current meds  Premature device failure.  Discussed safety windows Will need generator replacement We have reviewed the benefits and risks of generator replacement.  These include but are not limited to lead fracture and infection.  The patient understands, agrees and is willing to proceed.   Given his size will use anesthesia for change out.      COVID 19 screen The patient denies symptoms of COVID 19 at this time.  The importance of social distancing was discussed today.  Follow-up:  Wound check post change out Next remote:    Current medicines are reviewed at length with the patient today.   The patient does not have concerns regarding his medicines.  The following changes were made today:  none  Labs/ tests ordered today include: SICD change out w anesthesia week of 8/9 No orders of the defined types were placed in this encounter.   Future tests ( post COVID )   s  Patient Risk:  after full review of this patients clinical status, I feel that they are at moderate risk at this time.  Today, I have spent   minutes with the patient with telehealth technology discussing the above.  Signed, Berdie Malter,   MD  04/25/2020 2:51 PM     [facility] [address] [place] [address] [phone] (office) [phone] (fax)  

## 2020-04-25 NOTE — Progress Notes (Signed)
Electrophysiology TeleHealth Note   Due to national recommendations of social distancing due to COVID 19, an audio/video telehealth visit is felt to be most appropriate for this patient at this time.  See MyChart message from today for the patient's consent to telehealth for [place].   Date:  04/25/2020   ID:  Jeffrey [name], DOB 1976-03-29, MRN [id number]  Location: patient's home  Provider location: [address], [place]  Evaluation Performed: Follow-up visit  PCP:  [name], FNP  Cardiologist:     Electrophysiologist:  SK   Chief Complaint:  ICD   History of Present Illness:    Jeffrey [name] is a 44 y.o. male who presents via audio/video conferencing for a telehealth visit today.  Since last being seen in our clinic for NICM and SICD implanted 2016 for primary prevention in NICM. VT nonsustained and sustained with appropriate therapy on amiodarone, the patient is seen today because of ICD failure with early battery depletion;  pt reports doing well The patient denies chest pain ,  nocturnal dyspnea  Orthopnea  or peripheral edema  Dyspnea <<100 yds; > 1 flt of stairs  There have been no palpitations , lightheadedness  or syncope      CPAP compliant  Patient denies symptoms of GI intolerance, sun sensitivity, neurological symptoms attributable to amiodarone.    Date Cr K Hgb TSH LFTs  6/20 1.77 4.6 12.7 1.874 22  3/21  1.43 4.3  2.56 33   DATE TEST EF   8/16 LHC   [facility]ors-- normal  8/16 Echo  20-25%   3/19 Echo  25-30%        The patient denies symptoms of fevers, chills, cough, or new SOB worrisome for COVID 19.    Past Medical History:  Diagnosis Date  . Anxiety   . Aspirin allergy   . Childhood asthma   . Chronic systolic CHF (congestive heart failure) ([place])    a. EF 20-25% in 2012. b. EF 45-50% in 10/2011 with nonischemic nuc - presumed NICM. c. 12/2014 Echo: Sev depressed LV fxn, sev dil LV, mild LVH, mild MR, sev dil LA, mildly  reduced RV fxn.  . CKD (chronic kidney disease) stage 2, GFR 60-89 ml/min   . H/O vasectomy 12/2019  . High cholesterol   . Hypertension   . Morbid obesity ([place])   . Nephrolithiasis   . OSA on CPAP   . Presumed NICM    a. 04/2014 Myoview: EF 26%, glob HK, sev glob HK, ? prior infarct;  b. Never cathed 2/2 CKD.  [name] Renal cell carcinoma ([place])    a. s/p Rt robotic assisted partial converted to radical nephrectomy on 01/2013.  . Troponin level elevated    a. 04/2014, 12/2014: felt due to CHF.  . Type II diabetes mellitus ([place])   . Ventricular tachycardia ([place])    a. appropriate ICD therapy 12/2017    Past Surgical History:  Procedure Laterality Date  . APPENDECTOMY  07/2004  . CARDIAC CATHETERIZATION N/A 05/17/2015   Procedure: Right/Left Heart Cath and Coronary Angiography;  Surgeon: Jolaine Artist, MD;  Location: [facility];  Service: Cardiovascular;  Laterality: N/A;  . EP IMPLANTABLE DEVICE N/A 05/17/2015   Procedure: SubQ ICD Implant;  Surgeon: Will Meredith Leeds, MD;  Location: [facility];  Service: Cardiovascular;  Laterality: N/A;  . ROBOTIC ASSISTED LAPAROSCOPIC LYSIS OF ADHESION  01/13/2013   Procedure: ROBOTIC ASSISTED LAPAROSCOPIC LYSIS OF  ADHESION EXTENSIVE;  Surgeon: Alexis Frock, MD;  Location: [facility];  Service: Urology;;  . ROBOTIC ASSITED PARTIAL NEPHRECTOMY Right 01/13/2013   Procedure: ROBOTIC ASSITED PARTIAL NEPHRECTOMY CONVERTED TO ROBOTIC ASSISTED RIGHT RADICAL NEPHRECTOMY;  Surgeon: Alexis Frock, MD;  Location[facility];  Service: Urology;  Laterality: Right;  [name] VASECTOMY      Current Outpatient Medications  Medication Sig Dispense Refill  . Accu-Chek FastClix Lancets MISC USE AS DIRECTED FOUR TIMES DAILY 102 each 11  . allopurinol (ZYLOPRIM) 100 MG tablet Take 1 tablet (100 mg total) by mouth daily. 30 tablet 6  . amiodarone (PACERONE) 200 MG tablet Take 1 tablet (200 mg total) by mouth daily. 30 tablet 6  . apixaban (ELIQUIS) 5 MG TABS tablet  Take 1 tablet (5 mg total) by mouth 2 (two) times daily. 60 tablet 10  . blood glucose meter kit and supplies Dispense based on patient and insurance preference. Use up to four times daily as directed. (FOR ICD-10 E10.9, E11.9). 1 each 0  . busPIRone (BUSPAR) 10 MG tablet Take 1 tablet (10 mg total) by mouth 2 (two) times daily. 60 tablet 3  . carvedilol (COREG) 3.125 MG tablet Take 1 tablet (3.125 mg total) by mouth 2 (two) times daily. 60 tablet 6  . cetirizine (ZYRTEC) 10 MG tablet Take 1 tablet (10 mg total) by mouth daily. 30 tablet 6  . Colchicine (MITIGARE) 0.6 MG CAPS Take 1 tablet by mouth 2 (two) times daily. 60 capsule 6  . Continuous Blood Gluc Receiver (FREESTYLE LIBRE 14 DAY READER) DEVI 1 each by Does not apply route 3 (three) times daily between meals as needed. 1 each 1  . Continuous Blood Gluc Sensor (GUARDIAN SENSOR 3) MISC 1 Device by Does not apply route 3 (three) times daily between meals as needed. 5 each 6  . fluticasone (FLONASE) 50 MCG/ACT nasal spray Place 2 sprays into both nostrils daily. 16 g 6  . gabapentin (NEURONTIN) 300 MG capsule Take 1 capsule (300 mg total) by mouth 3 (three) times daily. 90 capsule 6  . glipiZIDE (GLUCOTROL) 10 MG tablet Take 1 tablet (10 mg total) by mouth 2 (two) times daily before a meal. 60 tablet 6  . glucose blood (ACCU-CHEK GUIDE) test strip USE AS DIRECTED FOUR TIMES DAILY 100 strip 11  . insulin aspart (NOVOLOG FLEXPEN) 100 UNIT/ML FlexPen Inject 0-15 Units into the skin 3 (three) times daily with meals. CBG 121-150: 2u; CBG 151-200: 3u; CBG 201-250: 5u; CBG 251-300: 8u; CBG 301-350: 11u; CBG 351-400:15 u; CBG>400: 15u 15 mL 12  . insulin NPH-regular Human (70-30) 100 UNIT/ML injection Inject 38 Units into the skin 2 (two) times daily with a meal. 60 mL 12  . Insulin Pen Needle (TRUEPLUS 5-BEVEL PEN NEEDLES) 31G X 6 MM MISC 1 Syringe by Does not apply route as needed. 100 each 12  . Insulin Syringe-Needle U-100 (TRUEPLUS INSULIN SYRINGE)  31G X 5/16" 0.5 ML MISC Use to inject insulin twice daily. 100 each 11  . isosorbide-hydrALAZINE (BIDIL) 20-37.5 MG tablet Take 1 tablet by mouth 3 (three) times daily. Needs appt 90 tablet 6  . Magnesium Oxide 200 MG TABS Take 100 mg by mouth daily.    . meclizine (ANTIVERT) 25 MG tablet Take 1 tablet (25 mg total) by mouth 3 (three) times daily. 30 tablet 6  . metFORMIN (GLUCOPHAGE) 1000 MG tablet Take 1 tablet (1,000 mg total) by mouth 2 (two) times daily with a meal. 60 tablet 6  .  sacubitril-valsartan (ENTRESTO) 97-103 MG Take 1 tablet by mouth 2 (two) times daily. 60 tablet 6  . spironolactone (ALDACTONE) 25 MG tablet Take 0.5 tablets (12.5 mg total) by mouth daily. 45 tablet 6  . torsemide (DEMADEX) 20 MG tablet Take 2 tablets (40 mg total) by mouth daily. 60 tablet 6  . traZODone (DESYREL) 100 MG tablet Take 1 tablet (100 mg total) by mouth at bedtime. For sleep 30 tablet 3  . Vitamin D, Ergocalciferol, (DRISDOL) 1.25 MG (50000 UT) CAPS capsule Take 1 capsule (50,000 Units total) by mouth once a week. 5 capsule 6   No current facility-administered medications for this visit.    Allergies:   Aspirin, Bee venom, Lisinopril, and Tomato   Social History:  The patient  reports that he quit smoking about 5 years ago. He has a 5.50 pack-year smoking history. He has never used smokeless tobacco. He reports current drug use. Frequency: 14.00 times per week. Drug: Marijuana. He reports that he does not drink alcohol.   Family History:  The patient's   family history includes Diabetes in his maternal aunt; Hypertension in his mother.   ROS:  Please see the history of present illness.   All other systems are personally reviewed and negative.    Exam:    Vital Signs:  Ht _0  (1.702 m)   Wt (!) 340 lb (154.2 kg)   BMI 53.25 kg/m      Labs/Other Tests and Data Reviewed:    Recent Labs: 07/19/2019: B Natriuretic Peptide 31.8 12/18/2019: ALT 33; BUN 22; Creatinine, Ser 1.43; Potassium 4.3;  Sodium 138; TSH 2.560   Wt Readings from Last 3 Encounters:  04/25/20 (!) 340 lb (154.2 kg)  03/19/20 (!) 335 lb (152 kg)  12/18/19 (!) 326 lb 12.8 oz (148.2 kg)     Other studies personally reviewed: Additional studies/ records that were reviewed today include As above         Last device remote is reviewed from [employer] PDF dated 7/21 which reveals failed device function w early battery depletion   ASSESSMENT & PLAN:   Nonischemic cardiomyopathy  Congestive heart failure-chronic-systolic-class II  Morbid obesity  Hypertension  High Risk Medication Surveillance amiodarone   ventricular tachycardia-nonsustained  ICD-subcutaneous  early battery depletion /failure  Euvolemic continue current meds  Premature device failure.  Discussed safety windows Will need generator replacement We have reviewed the benefits and risks of generator replacement.  These include but are not limited to lead fracture and infection.  The patient understands, agrees and is willing to proceed.   Given his size will use anesthesia for change out.      COVID 19 screen The patient denies symptoms of COVID 19 at this time.  The importance of social distancing was discussed today.  Follow-up:  Wound check post change out Next remote:    Current medicines are reviewed at length with the patient today.   The patient does not have concerns regarding his medicines.  The following changes were made today:  none  Labs/ tests ordered today include: SICD change out w anesthesia week of 8/9 No orders of the defined types were placed in this encounter.   Future tests ( post COVID )   s  Patient Risk:  after full review of this patients clinical status, I feel that they are at moderate risk at this time.  Today, I have spent   minutes with the patient with telehealth technology discussing the above.  Signed, Virl Axe,  MD  04/25/2020 2:51 PM     [place] [address] [phone] (office) [phone] (fax)

## 2020-04-25 NOTE — Patient Instructions (Signed)
Medication Instructions:  Your physician recommends that you continue on your current medications as directed. Please refer to the Current Medication list given to you today.  Labwork: CBC and BMET to be scheduled as well as Covid screening  Testing/Procedures: S-ICD Generator Change - I will call you with instructions once your labs and surgery has been scheduled.  Follow-Up: Your physician wants you to follow-up - You will be contacted by Dr Olin Pia scheduler, Doylene Canning to schedule your follow up appointments  Remote monitoring is used to monitor your Pacemaker of ICD from home. This monitoring reduces the number of office visits required to check your device to one time per year. It allows [place] to keep an eye on the functioning of your device to ensure it is working properly.   Any Other Special Instructions Will Be Listed Below (If Applicable).  If you need a refill on your cardiac medications before your next appointment, please call your pharmacy.

## 2020-05-02 ENCOUNTER — Telehealth: Payer: Self-pay

## 2020-05-02 DIAGNOSIS — I428 Other cardiomyopathies: Secondary | ICD-10-CM

## 2020-05-02 DIAGNOSIS — Z01812 Encounter for preprocedural laboratory examination: Secondary | ICD-10-CM

## 2020-05-02 DIAGNOSIS — I5022 Chronic systolic (congestive) heart failure: Secondary | ICD-10-CM

## 2020-05-03 NOTE — Telephone Encounter (Signed)
Spoke with pt and reviewed generator change instruction letter.  See Letter for complete details.  Pt verbalized understanding and agrees with current plan.

## 2020-05-10 ENCOUNTER — Other Ambulatory Visit: Payer: Medicaid Other | Admitting: *Deleted

## 2020-05-10 ENCOUNTER — Other Ambulatory Visit: Payer: Self-pay

## 2020-05-10 DIAGNOSIS — I5022 Chronic systolic (congestive) heart failure: Secondary | ICD-10-CM

## 2020-05-10 DIAGNOSIS — Z01812 Encounter for preprocedural laboratory examination: Secondary | ICD-10-CM

## 2020-05-10 DIAGNOSIS — I420 Dilated cardiomyopathy: Secondary | ICD-10-CM

## 2020-05-11 ENCOUNTER — Other Ambulatory Visit (HOSPITAL_COMMUNITY): Payer: Medicaid Other

## 2020-05-11 LAB — BASIC METABOLIC PANEL
BUN/Creatinine Ratio: 18 (ref 9–20)
BUN: 25 mg/dL — ABNORMAL HIGH (ref 6–24)
CO2: 19 mmol/L — ABNORMAL LOW (ref 20–29)
Calcium: 9.8 mg/dL (ref 8.7–10.2)
Chloride: 106 mmol/L (ref 96–106)
Creatinine, Ser: 1.4 mg/dL — ABNORMAL HIGH (ref 0.76–1.27)
GFR calc Af Amer: 70 mL/min/{1.73_m2} (ref >59)
GFR calc non Af Amer: 60 mL/min/{1.73_m2} (ref >59)
Glucose: 209 mg/dL — ABNORMAL HIGH (ref 65–99)
Potassium: 4.6 mmol/L (ref 3.5–5.2)
Sodium: 138 mmol/L (ref 134–144)

## 2020-05-11 LAB — CBC
Hematocrit: 42.5 % (ref 37.5–51.0)
Hemoglobin: 14.2 g/dL (ref 13.0–17.7)
MCH: 28.5 pg (ref 26.6–33.0)
MCHC: 33.4 g/dL (ref 31.5–35.7)
MCV: 85 fL (ref 79–97)
Platelets: 277 10*3/uL (ref 150–450)
RBC: 4.99 x10E6/uL (ref 4.14–5.80)
RDW: 14.1 % (ref 11.6–15.4)
WBC: 6.9 10*3/uL (ref 3.4–10.8)

## 2020-05-13 ENCOUNTER — Other Ambulatory Visit (HOSPITAL_COMMUNITY)
Admission: RE | Admit: 2020-05-13 | Discharge: 2020-05-13 | Disposition: A | Discharge status: Home / Self Care | Payer: Medicaid Other | Source: Ambulatory Visit | Attending: Internal Medicine | Admitting: Internal Medicine

## 2020-05-13 ENCOUNTER — Ambulatory Visit (INDEPENDENT_AMBULATORY_CARE_PROVIDER_SITE_OTHER): Payer: Medicaid Other | Admitting: *Deleted

## 2020-05-13 DIAGNOSIS — Z20822 Contact with and (suspected) exposure to covid-19: Secondary | ICD-10-CM | POA: Insufficient documentation

## 2020-05-13 DIAGNOSIS — I428 Other cardiomyopathies: Secondary | ICD-10-CM

## 2020-05-13 DIAGNOSIS — Z01812 Encounter for preprocedural laboratory examination: Secondary | ICD-10-CM | POA: Diagnosis not present

## 2020-05-13 LAB — CUP PACEART REMOTE DEVICE CHECK
Battery Remaining Percentage: 6 %
Date Time Interrogation Session: 20210809095400
Implantable Lead Implant Date: 20160812
Implantable Lead Location: 753862
Implantable Lead Model: 3401
Implantable Pulse Generator Implant Date: 20160812
Pulse Gen Serial Number: 117162

## 2020-05-13 LAB — SARS CORONAVIRUS 2 (TAT 6-24 HRS): SARS Coronavirus 2: NEGATIVE

## 2020-05-13 NOTE — Progress Notes (Signed)
Instructed patient on the following items: Arrival time 0630 on Wednesday morning Nothing to eat or drink after midnight,l Tuesday night No meds AM of procedure, 1/2 dose insulin on Tuesday night and none of Wednesday morning. Responsible person to drive you home and stay with you for 24 hrs Wash with special soap night before and morning of procedure If on anti-coagulant drug instructions no Eliquis Tuesday night or Wednesday morning

## 2020-05-14 NOTE — Anesthesia Preprocedure Evaluation (Addendum)
Anesthesia Evaluation  Patient identified by MRN, date of birth, ID band Patient awake    Reviewed: Allergy & Precautions, NPO status , Patient's Chart, lab work & pertinent test results  History of Anesthesia Complications Negative for: history of anesthetic complications  Airway Mallampati: II  TM Distance: >3 FB Neck ROM: Full    Dental  (+) Dental Advisory Given, Chipped, Missing,    Pulmonary asthma , sleep apnea and Continuous Positive Airway Pressure Ventilation , Patient abstained from smoking., former smoker,    Pulmonary exam normal breath sounds clear to auscultation       Cardiovascular hypertension, Pt. on medications +CHF  Normal cardiovascular exam+ dysrhythmias Ventricular Tachycardia + Cardiac Defibrillator  Rhythm:Regular Rate:Normal  Echo 12/31/17: Study Conclusions   - Left ventricle: The cavity size was severely dilated. Systolic  function was severely reduced. The estimated ejection fraction  was in the range of 25% to 30%. Diffuse hypokinesis. The study is  not technically sufficient to allow evaluation of LV diastolic  function.  - Mitral valve: Mildly thickened leaflets . There was moderate  regurgitation. Valve area by pressure half-time: 1.71 cm^2.  - Left atrium: Severely dilated.  - Right ventricle: The cavity size was mildly dilated.  - Right atrium: Severely dilated.  - Tricuspid valve: There was moderate regurgitation.  - Pulmonary arteries: PA peak pressure: 67 mm Hg (S).  - Inferior vena cava: The vessel was dilated. The respirophasic  diameter changes were blunted (< 50%), consistent with elevated  central venous pressure.    Neuro/Psych PSYCHIATRIC DISORDERS Anxiety negative neurological ROS     GI/Hepatic negative GI ROS, (+)     substance abuse  marijuana use,   Endo/Other  diabetes, Type 2, Oral Hypoglycemic Agents, Insulin DependentMorbid obesity (BMI 53)   Renal/GU Renal InsufficiencyRenal disease (s/p nephrectomy)     Musculoskeletal  (+) Arthritis ,   Abdominal   Peds  Hematology negative hematology ROS (+)   Anesthesia Other Findings   Reproductive/Obstetrics                            Anesthesia Physical Anesthesia Plan  ASA: III  Anesthesia Plan: General   Post-op Pain Management:    Induction: Intravenous  PONV Risk Score and Plan: 2 and Midazolam and Ondansetron  Airway Management Planned: LMA  Additional Equipment:   Intra-op Plan:   Post-operative Plan: Extubation in OR  Informed Consent: I have reviewed the patients History and Physical, chart, labs and discussed the procedure including the risks, benefits and alternatives for the proposed anesthesia with the patient or authorized representative who has indicated his/her understanding and acceptance.     Dental advisory given  Plan Discussed with: CRNA  Anesthesia Plan Comments:        Anesthesia Quick Evaluation

## 2020-05-14 NOTE — Progress Notes (Signed)
Remote ICD transmission.   

## 2020-05-15 ENCOUNTER — Encounter (HOSPITAL_COMMUNITY)
Admission: RE | Disposition: A | Discharge status: Home / Self Care | Payer: Self-pay | Source: Home / Self Care | Attending: Internal Medicine

## 2020-05-15 ENCOUNTER — Ambulatory Visit (HOSPITAL_COMMUNITY): Payer: Medicaid Other | Admitting: Anesthesiology

## 2020-05-15 ENCOUNTER — Encounter (HOSPITAL_COMMUNITY): Payer: Self-pay | Admitting: Internal Medicine

## 2020-05-15 ENCOUNTER — Inpatient Hospital Stay (HOSPITAL_COMMUNITY): Payer: Medicaid Other

## 2020-05-15 ENCOUNTER — Other Ambulatory Visit: Payer: Self-pay

## 2020-05-15 ENCOUNTER — Ambulatory Visit (HOSPITAL_COMMUNITY): Payer: Medicaid Other

## 2020-05-15 ENCOUNTER — Inpatient Hospital Stay (HOSPITAL_COMMUNITY)
Admission: RE | Admit: 2020-05-15 | Discharge: 2020-05-17 | DRG: 245 | Disposition: A | Discharge status: Home / Self Care | Payer: Medicaid Other | Attending: Internal Medicine | Admitting: Internal Medicine

## 2020-05-15 DIAGNOSIS — M109 Gout, unspecified: Secondary | ICD-10-CM | POA: Diagnosis present

## 2020-05-15 DIAGNOSIS — Z794 Long term (current) use of insulin: Secondary | ICD-10-CM

## 2020-05-15 DIAGNOSIS — Z7901 Long term (current) use of anticoagulants: Secondary | ICD-10-CM | POA: Diagnosis not present

## 2020-05-15 DIAGNOSIS — I428 Other cardiomyopathies: Secondary | ICD-10-CM | POA: Diagnosis present

## 2020-05-15 DIAGNOSIS — J9690 Respiratory failure, unspecified, unspecified whether with hypoxia or hypercapnia: Secondary | ICD-10-CM

## 2020-05-15 DIAGNOSIS — Z20822 Contact with and (suspected) exposure to covid-19: Secondary | ICD-10-CM | POA: Diagnosis not present

## 2020-05-15 DIAGNOSIS — G9340 Encephalopathy, unspecified: Secondary | ICD-10-CM

## 2020-05-15 DIAGNOSIS — M1990 Unspecified osteoarthritis, unspecified site: Secondary | ICD-10-CM | POA: Diagnosis present

## 2020-05-15 DIAGNOSIS — Y658 Other specified misadventures during surgical and medical care: Secondary | ICD-10-CM | POA: Diagnosis not present

## 2020-05-15 DIAGNOSIS — Z95810 Presence of automatic (implantable) cardiac defibrillator: Secondary | ICD-10-CM

## 2020-05-15 DIAGNOSIS — Z886 Allergy status to analgesic agent status: Secondary | ICD-10-CM

## 2020-05-15 DIAGNOSIS — Z905 Acquired absence of kidney: Secondary | ICD-10-CM | POA: Diagnosis not present

## 2020-05-15 DIAGNOSIS — J9600 Acute respiratory failure, unspecified whether with hypoxia or hypercapnia: Secondary | ICD-10-CM

## 2020-05-15 DIAGNOSIS — Z6841 Body Mass Index (BMI) 40.0 and over, adult: Secondary | ICD-10-CM

## 2020-05-15 DIAGNOSIS — N183 Chronic kidney disease, stage 3 unspecified: Secondary | ICD-10-CM | POA: Diagnosis not present

## 2020-05-15 DIAGNOSIS — J9602 Acute respiratory failure with hypercapnia: Secondary | ICD-10-CM | POA: Diagnosis not present

## 2020-05-15 DIAGNOSIS — E1169 Type 2 diabetes mellitus with other specified complication: Secondary | ICD-10-CM | POA: Diagnosis present

## 2020-05-15 DIAGNOSIS — G4733 Obstructive sleep apnea (adult) (pediatric): Secondary | ICD-10-CM | POA: Diagnosis not present

## 2020-05-15 DIAGNOSIS — H538 Other visual disturbances: Secondary | ICD-10-CM | POA: Diagnosis present

## 2020-05-15 DIAGNOSIS — Z888 Allergy status to other drugs, medicaments and biological substances status: Secondary | ICD-10-CM | POA: Diagnosis not present

## 2020-05-15 DIAGNOSIS — I5020 Unspecified systolic (congestive) heart failure: Secondary | ICD-10-CM | POA: Diagnosis not present

## 2020-05-15 DIAGNOSIS — Z538 Procedure and treatment not carried out for other reasons: Secondary | ICD-10-CM | POA: Diagnosis not present

## 2020-05-15 DIAGNOSIS — I472 Ventricular tachycardia: Secondary | ICD-10-CM | POA: Diagnosis not present

## 2020-05-15 DIAGNOSIS — T82191A Other mechanical complication of cardiac pulse generator (battery), initial encounter: Secondary | ICD-10-CM | POA: Diagnosis not present

## 2020-05-15 DIAGNOSIS — E1122 Type 2 diabetes mellitus with diabetic chronic kidney disease: Secondary | ICD-10-CM | POA: Diagnosis not present

## 2020-05-15 DIAGNOSIS — Z978 Presence of other specified devices: Secondary | ICD-10-CM

## 2020-05-15 DIAGNOSIS — T17908A Unspecified foreign body in respiratory tract, part unspecified causing other injury, initial encounter: Secondary | ICD-10-CM

## 2020-05-15 DIAGNOSIS — J690 Pneumonitis due to inhalation of food and vomit: Secondary | ICD-10-CM | POA: Diagnosis not present

## 2020-05-15 DIAGNOSIS — M25522 Pain in left elbow: Secondary | ICD-10-CM | POA: Diagnosis present

## 2020-05-15 DIAGNOSIS — T17998A Other foreign object in respiratory tract, part unspecified causing other injury, initial encounter: Secondary | ICD-10-CM | POA: Diagnosis present

## 2020-05-15 DIAGNOSIS — I480 Paroxysmal atrial fibrillation: Secondary | ICD-10-CM | POA: Diagnosis present

## 2020-05-15 DIAGNOSIS — T884XXA Failed or difficult intubation, initial encounter: Secondary | ICD-10-CM | POA: Diagnosis not present

## 2020-05-15 DIAGNOSIS — Y92234 Operating room of hospital as the place of occurrence of the external cause: Secondary | ICD-10-CM | POA: Diagnosis not present

## 2020-05-15 DIAGNOSIS — Z4682 Encounter for fitting and adjustment of non-vascular catheter: Secondary | ICD-10-CM | POA: Diagnosis not present

## 2020-05-15 DIAGNOSIS — I130 Hypertensive heart and chronic kidney disease with heart failure and stage 1 through stage 4 chronic kidney disease, or unspecified chronic kidney disease: Secondary | ICD-10-CM | POA: Diagnosis present

## 2020-05-15 DIAGNOSIS — Z91018 Allergy to other foods: Secondary | ICD-10-CM | POA: Diagnosis not present

## 2020-05-15 DIAGNOSIS — Z8249 Family history of ischemic heart disease and other diseases of the circulatory system: Secondary | ICD-10-CM

## 2020-05-15 DIAGNOSIS — J9601 Acute respiratory failure with hypoxia: Secondary | ICD-10-CM | POA: Diagnosis not present

## 2020-05-15 DIAGNOSIS — I10 Essential (primary) hypertension: Secondary | ICD-10-CM

## 2020-05-15 DIAGNOSIS — K6389 Other specified diseases of intestine: Secondary | ICD-10-CM | POA: Diagnosis not present

## 2020-05-15 DIAGNOSIS — F419 Anxiety disorder, unspecified: Secondary | ICD-10-CM | POA: Diagnosis not present

## 2020-05-15 DIAGNOSIS — J811 Chronic pulmonary edema: Secondary | ICD-10-CM | POA: Diagnosis not present

## 2020-05-15 DIAGNOSIS — Z4502 Encounter for adjustment and management of automatic implantable cardiac defibrillator: Secondary | ICD-10-CM

## 2020-05-15 DIAGNOSIS — Z79899 Other long term (current) drug therapy: Secondary | ICD-10-CM | POA: Diagnosis not present

## 2020-05-15 DIAGNOSIS — J810 Acute pulmonary edema: Secondary | ICD-10-CM | POA: Diagnosis not present

## 2020-05-15 DIAGNOSIS — N182 Chronic kidney disease, stage 2 (mild): Secondary | ICD-10-CM | POA: Diagnosis not present

## 2020-05-15 DIAGNOSIS — Z85528 Personal history of other malignant neoplasm of kidney: Secondary | ICD-10-CM

## 2020-05-15 DIAGNOSIS — Z833 Family history of diabetes mellitus: Secondary | ICD-10-CM

## 2020-05-15 DIAGNOSIS — E785 Hyperlipidemia, unspecified: Secondary | ICD-10-CM | POA: Diagnosis present

## 2020-05-15 DIAGNOSIS — Z91030 Bee allergy status: Secondary | ICD-10-CM

## 2020-05-15 DIAGNOSIS — I5022 Chronic systolic (congestive) heart failure: Secondary | ICD-10-CM | POA: Diagnosis present

## 2020-05-15 DIAGNOSIS — Z87891 Personal history of nicotine dependence: Secondary | ICD-10-CM

## 2020-05-15 DIAGNOSIS — Z9852 Vasectomy status: Secondary | ICD-10-CM

## 2020-05-15 DIAGNOSIS — R338 Other retention of urine: Secondary | ICD-10-CM | POA: Diagnosis not present

## 2020-05-15 HISTORY — PX: SUBQ ICD CHANGEOUT: EP1235

## 2020-05-15 LAB — BLOOD GAS, ARTERIAL
Acid-base deficit: 4.7 mmol/L — ABNORMAL HIGH (ref 0.0–2.0)
Bicarbonate: 20.9 mmol/L (ref 20.0–28.0)
Drawn by: 35493
FIO2: 100
O2 Saturation: 98.2 %
Patient temperature: 36.5
pCO2 arterial: 44.5 mmHg (ref 32.0–48.0)
pH, Arterial: 7.289 — ABNORMAL LOW (ref 7.350–7.450)
pO2, Arterial: 138 mmHg — ABNORMAL HIGH (ref 83.0–108.0)

## 2020-05-15 LAB — COMPREHENSIVE METABOLIC PANEL
ALT: 28 U/L (ref 0–44)
AST: 27 U/L (ref 15–41)
Albumin: 3.5 g/dL (ref 3.5–5.0)
Alkaline Phosphatase: 44 U/L (ref 38–126)
Anion gap: 11 (ref 5–15)
BUN: 32 mg/dL — ABNORMAL HIGH (ref 6–20)
CO2: 18 mmol/L — ABNORMAL LOW (ref 22–32)
Calcium: 9.2 mg/dL (ref 8.9–10.3)
Chloride: 106 mmol/L (ref 98–111)
Creatinine, Ser: 1.85 mg/dL — ABNORMAL HIGH (ref 0.61–1.24)
GFR calc Af Amer: 50 mL/min — ABNORMAL LOW (ref >60)
GFR calc non Af Amer: 43 mL/min — ABNORMAL LOW (ref >60)
Glucose, Bld: 335 mg/dL — ABNORMAL HIGH (ref 70–99)
Potassium: 5.9 mmol/L — ABNORMAL HIGH (ref 3.5–5.1)
Sodium: 135 mmol/L (ref 135–145)
Total Bilirubin: 0.5 mg/dL (ref 0.3–1.2)
Total Protein: 6.9 g/dL (ref 6.5–8.1)

## 2020-05-15 LAB — POCT I-STAT 7, (LYTES, BLD GAS, ICA,H+H)
Acid-base deficit: 6 mmol/L — ABNORMAL HIGH (ref 0.0–2.0)
Bicarbonate: 22.1 mmol/L (ref 20.0–28.0)
Calcium, Ion: 1.27 mmol/L (ref 1.15–1.40)
HCT: 40 % (ref 39.0–52.0)
Hemoglobin: 13.6 g/dL (ref 13.0–17.0)
O2 Saturation: 94 %
Potassium: 4.8 mmol/L (ref 3.5–5.1)
Sodium: 143 mmol/L (ref 135–145)
TCO2: 24 mmol/L (ref 22–32)
pCO2 arterial: 52.7 mmHg — ABNORMAL HIGH (ref 32.0–48.0)
pH, Arterial: 7.231 — ABNORMAL LOW (ref 7.350–7.450)
pO2, Arterial: 83 mmHg (ref 83.0–108.0)

## 2020-05-15 LAB — GLUCOSE, CAPILLARY
Glucose-Capillary: 162 mg/dL — ABNORMAL HIGH (ref 70–99)
Glucose-Capillary: 206 mg/dL — ABNORMAL HIGH (ref 70–99)
Glucose-Capillary: 238 mg/dL — ABNORMAL HIGH (ref 70–99)
Glucose-Capillary: 259 mg/dL — ABNORMAL HIGH (ref 70–99)
Glucose-Capillary: 292 mg/dL — ABNORMAL HIGH (ref 70–99)

## 2020-05-15 LAB — CBC
HCT: 41.9 % (ref 39.0–52.0)
Hemoglobin: 13.8 g/dL (ref 13.0–17.0)
MCH: 28.2 pg (ref 26.0–34.0)
MCHC: 32.9 g/dL (ref 30.0–36.0)
MCV: 85.5 fL (ref 80.0–100.0)
Platelets: 288 10*3/uL (ref 150–400)
RBC: 4.9 MIL/uL (ref 4.22–5.81)
RDW: 14.7 % (ref 11.5–15.5)
WBC: 10.1 10*3/uL (ref 4.0–10.5)
nRBC: 0 % (ref 0.0–0.2)

## 2020-05-15 SURGERY — SUBQ ICD CHANGEOUT
Anesthesia: General

## 2020-05-15 MED ORDER — FENTANYL CITRATE (PF) 100 MCG/2ML IJ SOLN: 50.0000 ug | INTRAMUSCULAR | Status: DC | PRN

## 2020-05-15 MED ORDER — ALBUTEROL SULFATE HFA 108 (90 BASE) MCG/ACT IN AERS
INHALATION_SPRAY | RESPIRATORY_TRACT | Status: DC | PRN
Start: 2020-05-15 — End: 2020-05-15
  Administered 2020-05-15 (×2): 4 via RESPIRATORY_TRACT

## 2020-05-15 MED ORDER — CHLORHEXIDINE GLUCONATE 4 % EX LIQD: 60.0000 mL | Freq: Once | CUTANEOUS | Status: AC

## 2020-05-15 MED ORDER — LIDOCAINE 2% (20 MG/ML) 5 ML SYRINGE
INTRAMUSCULAR | Status: DC | PRN
  Administered 2020-05-15: 100 mg via INTRAVENOUS

## 2020-05-15 MED ORDER — FENTANYL CITRATE (PF) 100 MCG/2ML IJ SOLN
INTRAMUSCULAR | Status: DC | PRN
  Administered 2020-05-15: 75 ug via INTRAVENOUS

## 2020-05-15 MED ORDER — CHLORHEXIDINE GLUCONATE 4 % EX LIQD
CUTANEOUS | Status: AC
  Administered 2020-05-15: 4 via TOPICAL
  Filled 2020-05-15: qty 15

## 2020-05-15 MED ORDER — SUGAMMADEX SODIUM 200 MG/2ML IV SOLN
INTRAVENOUS | Status: DC | PRN
  Administered 2020-05-15: 600 mg via INTRAVENOUS

## 2020-05-15 MED ORDER — PROPOFOL 1000 MG/100ML IV EMUL
0.0000 ug/kg/min | INTRAVENOUS | Status: DC
  Administered 2020-05-15: 40 ug/kg/min via INTRAVENOUS
  Administered 2020-05-15 (×2): 50 ug/kg/min via INTRAVENOUS
  Administered 2020-05-15: 40 ug/kg/min via INTRAVENOUS
  Administered 2020-05-16: 100 ug/kg/min via INTRAVENOUS
  Administered 2020-05-16: 50 ug/kg/min via INTRAVENOUS
  Administered 2020-05-16: 45 ug/kg/min via INTRAVENOUS
  Administered 2020-05-16: 50 ug/kg/min via INTRAVENOUS
  Administered 2020-05-16: 45 ug/kg/min via INTRAVENOUS
  Filled 2020-05-15 (×8): qty 100

## 2020-05-15 MED ORDER — FENTANYL 2500MCG IN NS 250ML (10MCG/ML) PREMIX INFUSION
0.0000 ug/h | INTRAVENOUS | Status: DC
  Administered 2020-05-15: 25 ug/h via INTRAVENOUS
  Administered 2020-05-15: 350 ug/h via INTRAVENOUS
  Administered 2020-05-16: 250 ug/h via INTRAVENOUS
  Administered 2020-05-16: 350 ug/h via INTRAVENOUS
  Filled 2020-05-15 (×3): qty 250
  Filled 2020-05-15: qty 2500

## 2020-05-15 MED ORDER — ONDANSETRON HCL 4 MG/2ML IJ SOLN
INTRAMUSCULAR | Status: DC | PRN
  Administered 2020-05-15: 4 mg via INTRAVENOUS

## 2020-05-15 MED ORDER — ORAL CARE MOUTH RINSE
15.0000 mL | OROMUCOSAL | Status: DC
  Administered 2020-05-15 – 2020-05-16 (×8): 15 mL via OROMUCOSAL

## 2020-05-15 MED ORDER — ETOMIDATE 2 MG/ML IV SOLN
INTRAVENOUS | Status: DC | PRN
  Administered 2020-05-15: 18 mg via INTRAVENOUS

## 2020-05-15 MED ORDER — PHENYLEPHRINE HCL-NACL 10-0.9 MG/250ML-% IV SOLN
INTRAVENOUS | Status: DC | PRN
  Administered 2020-05-15: 25 ug/min via INTRAVENOUS

## 2020-05-15 MED ORDER — SUCCINYLCHOLINE CHLORIDE 200 MG/10ML IV SOSY
PREFILLED_SYRINGE | INTRAVENOUS | Status: DC | PRN
  Administered 2020-05-15: 160 mg via INTRAVENOUS

## 2020-05-15 MED ORDER — DEXTROSE 5 % IV SOLN
3.0000 g | INTRAVENOUS | Status: DC
  Filled 2020-05-15 (×2): qty 3000

## 2020-05-15 MED ORDER — ACETAMINOPHEN 500 MG PO TABS
1000.0000 mg | ORAL_TABLET | Freq: Once | ORAL | Status: AC
  Filled 2020-05-15: qty 2

## 2020-05-15 MED ORDER — BUPIVACAINE HCL (PF) 0.25 % IJ SOLN
INTRAMUSCULAR | Status: AC
  Filled 2020-05-15: qty 60

## 2020-05-15 MED ORDER — PROPOFOL 10 MG/ML IV BOLUS
INTRAVENOUS | Status: DC | PRN
  Administered 2020-05-15: 50 mg via INTRAVENOUS

## 2020-05-15 MED ORDER — PROPOFOL 500 MG/50ML IV EMUL
INTRAVENOUS | Status: DC | PRN
  Administered 2020-05-15: 25 ug/kg/min via INTRAVENOUS

## 2020-05-15 MED ORDER — CHLORHEXIDINE GLUCONATE CLOTH 2 % EX PADS
6.0000 | MEDICATED_PAD | Freq: Every day | CUTANEOUS | Status: DC
  Administered 2020-05-15 – 2020-05-16 (×2): 6 via TOPICAL

## 2020-05-15 MED ORDER — FENTANYL CITRATE (PF) 100 MCG/2ML IJ SOLN: 25.0000 ug | INTRAMUSCULAR | Status: DC | PRN

## 2020-05-15 MED ORDER — NOREPINEPHRINE 4 MG/250ML-% IV SOLN
2.0000 ug/min | INTRAVENOUS | Status: DC
  Administered 2020-05-15: 6 ug/min via INTRAVENOUS
  Administered 2020-05-16: 1 ug/min via INTRAVENOUS
  Filled 2020-05-15: qty 250

## 2020-05-15 MED ORDER — FUROSEMIDE 10 MG/ML IJ SOLN
INTRAMUSCULAR | Status: DC | PRN
Start: 2020-05-15 — End: 2020-05-15
  Administered 2020-05-15: 40 mg via INTRAMUSCULAR

## 2020-05-15 MED ORDER — ACETAMINOPHEN 500 MG PO TABS
ORAL_TABLET | ORAL | Status: AC
  Administered 2020-05-15: 1000 mg via ORAL
  Filled 2020-05-15: qty 2

## 2020-05-15 MED ORDER — INSULIN ASPART 100 UNIT/ML ~~LOC~~ SOLN
0.0000 [IU] | SUBCUTANEOUS | Status: DC
  Administered 2020-05-15: 11 [IU] via SUBCUTANEOUS
  Administered 2020-05-15: 7 [IU] via SUBCUTANEOUS
  Administered 2020-05-16: 4 [IU] via SUBCUTANEOUS
  Administered 2020-05-16: 11 [IU] via SUBCUTANEOUS
  Administered 2020-05-16: 4 [IU] via SUBCUTANEOUS
  Administered 2020-05-16 (×2): 3 [IU] via SUBCUTANEOUS
  Administered 2020-05-16: 7 [IU] via SUBCUTANEOUS
  Administered 2020-05-17: 4 [IU] via SUBCUTANEOUS
  Administered 2020-05-17: 3 [IU] via SUBCUTANEOUS
  Administered 2020-05-17: 4 [IU] via SUBCUTANEOUS
  Administered 2020-05-17: 7 [IU] via SUBCUTANEOUS
  Administered 2020-05-17: 3 [IU] via SUBCUTANEOUS

## 2020-05-15 MED ORDER — DOCUSATE SODIUM 50 MG/5ML PO LIQD
100.0000 mg | Freq: Two times a day (BID) | ORAL | Status: DC
  Administered 2020-05-15 – 2020-05-16 (×2): 100 mg via ORAL
  Filled 2020-05-15 (×3): qty 10

## 2020-05-15 MED ORDER — PROPOFOL 1000 MG/100ML IV EMUL
INTRAVENOUS | Status: AC
  Administered 2020-05-15: 50 ug/kg/min via INTRAVENOUS
  Filled 2020-05-15: qty 100

## 2020-05-15 MED ORDER — POLYETHYLENE GLYCOL 3350 17 G PO PACK: 17.0000 g | PACK | Freq: Every day | ORAL | Status: DC

## 2020-05-15 MED ORDER — CHLORHEXIDINE GLUCONATE 0.12% ORAL RINSE (MEDLINE KIT)
15.0000 mL | Freq: Two times a day (BID) | OROMUCOSAL | Status: DC
  Administered 2020-05-15 – 2020-05-16 (×3): 15 mL via OROMUCOSAL

## 2020-05-15 MED ORDER — FUROSEMIDE 10 MG/ML IJ SOLN
INTRAMUSCULAR | Status: AC
  Filled 2020-05-15: qty 4

## 2020-05-15 MED ORDER — SODIUM CHLORIDE 0.9 % IV SOLN
80.0000 mg | INTRAVENOUS | Status: DC
  Filled 2020-05-15: qty 2

## 2020-05-15 MED ORDER — MIDAZOLAM HCL 5 MG/5ML IJ SOLN
INTRAMUSCULAR | Status: DC | PRN
  Administered 2020-05-15: 2 mg via INTRAVENOUS

## 2020-05-15 MED ORDER — SODIUM ZIRCONIUM CYCLOSILICATE 10 G PO PACK
10.0000 g | PACK | Freq: Once | ORAL | Status: AC
  Administered 2020-05-15: 10 g via ORAL
  Filled 2020-05-15: qty 1

## 2020-05-15 MED ORDER — SODIUM CHLORIDE 0.9 % IV SOLN: INTRAVENOUS | Status: DC

## 2020-05-15 MED ORDER — SODIUM CHLORIDE 0.9 % IV SOLN
INTRAVENOUS | Status: AC
  Filled 2020-05-15: qty 2

## 2020-05-15 MED ORDER — DEXAMETHASONE SODIUM PHOSPHATE 10 MG/ML IJ SOLN
INTRAMUSCULAR | Status: DC | PRN
  Administered 2020-05-15: 10 mg via INTRAVENOUS

## 2020-05-15 MED ORDER — ONDANSETRON HCL 4 MG/2ML IJ SOLN: 4.0000 mg | Freq: Once | INTRAMUSCULAR | Status: DC | PRN

## 2020-05-15 MED ORDER — FUROSEMIDE 10 MG/ML IJ SOLN
40.0000 mg | Freq: Once | INTRAMUSCULAR | Status: AC
  Administered 2020-05-15: 40 mg via INTRAVENOUS

## 2020-05-15 MED ORDER — SODIUM CHLORIDE 0.9 % IV SOLN: 80.0000 mg | INTRAVENOUS | Status: DC

## 2020-05-15 MED ORDER — PANTOPRAZOLE SODIUM 40 MG IV SOLR
40.0000 mg | Freq: Every day | INTRAVENOUS | Status: DC
  Administered 2020-05-16 – 2020-05-17 (×2): 40 mg via INTRAVENOUS
  Filled 2020-05-15 (×2): qty 40

## 2020-05-15 MED ORDER — PROPOFOL 10 MG/ML IV BOLUS
INTRAVENOUS | Status: AC
  Filled 2020-05-15: qty 20

## 2020-05-15 MED ORDER — PHENYLEPHRINE 40 MCG/ML (10ML) SYRINGE FOR IV PUSH (FOR BLOOD PRESSURE SUPPORT)
PREFILLED_SYRINGE | INTRAVENOUS | Status: DC | PRN
  Administered 2020-05-15: 40 ug via INTRAVENOUS
  Administered 2020-05-15 (×2): 120 ug via INTRAVENOUS

## 2020-05-15 MED ORDER — ROCURONIUM BROMIDE 10 MG/ML (PF) SYRINGE
PREFILLED_SYRINGE | INTRAVENOUS | Status: DC | PRN
  Administered 2020-05-15: 80 mg via INTRAVENOUS

## 2020-05-15 MED ORDER — CHLORHEXIDINE GLUCONATE 4 % EX LIQD
60.0000 mL | Freq: Once | CUTANEOUS | Status: AC
  Administered 2020-05-16: 4 via TOPICAL
  Filled 2020-05-15: qty 15

## 2020-05-15 MED ORDER — DEXTROSE 5 % IV SOLN
3.0000 g | INTRAVENOUS | Status: DC
  Filled 2020-05-15 (×3): qty 3000

## 2020-05-15 SURGICAL SUPPLY — 3 items
CABLE SURGICAL S-101-97-12 (CABLE) ×3 IMPLANT
PAD PRO RADIOLUCENT 2001M-C (PAD) ×3 IMPLANT
TRAY PACEMAKER INSERTION (PACKS) ×3 IMPLANT

## 2020-05-15 NOTE — Progress Notes (Signed)
Hemodynamics stable still on levophed  BP 115 HR 52-- lower than normal  Foley in place  Discussed with DR McDiarmid FiO2 at 50% still PEEP 10  Plan is for some weaning but to proceed with ICD generator change prior to extubation- anticipated tomorrow but will need to see how he is doing  Hopefully his aspiration process will not worsen   Discussed with Mother

## 2020-05-15 NOTE — Anesthesia Preprocedure Evaluation (Addendum)
Anesthesia Evaluation  Patient identified by MRN, date of birth, ID band Patient unresponsive    Reviewed: Allergy & Precautions, Patient's Chart, lab work & pertinent test results, Unable to perform ROS - Chart review only  History of Anesthesia Complications Negative for: history of anesthetic complications  Airway Mallampati: Intubated       Dental  (+)    Pulmonary asthma , sleep apnea and Continuous Positive Airway Pressure Ventilation , Patient abstained from smoking., former smoker,    + rhonchi  + decreased breath sounds      Cardiovascular hypertension, Pt. on medications +CHF  + dysrhythmias Ventricular Tachycardia + Cardiac Defibrillator  Rhythm:Regular Rate:Normal  Echo 12/31/17: Study Conclusions   - Left ventricle: The cavity size was severely dilated. Systolicfunction was severely reduced. The estimated ejection fractionwas in the range of 25% to 30%. Diffuse hypokinesis. The study isnot technically sufficient to allow evaluation of LV diastolicfunction.  - Mitral valve: Mildly thickened leaflets . There was moderateregurgitation. Valve area by pressure half-time: 1.71 cm^2.  - Left atrium: Severely dilated.  - Right ventricle: The cavity size was mildly dilated.  - Right atrium: Severely dilated.  - Tricuspid valve: There was moderate regurgitation.  - Pulmonary arteries: PA peak pressure: 67 mm Hg (S).  - Inferior vena cava: The vessel was dilated. The respirophasicdiameter changes were blunted (< 50%), consistent with elevatedcentral venous pressure.    Neuro/Psych PSYCHIATRIC DISORDERS Anxiety negative neurological ROS     GI/Hepatic negative GI ROS, (+)     substance abuse  marijuana use,   Endo/Other  diabetes, Type 2, Oral Hypoglycemic Agents, Insulin DependentMorbid obesity (BMI 53)  Renal/GU Renal InsufficiencyRenal disease (s/p nephrectomy)     Musculoskeletal  (+) Arthritis ,    Abdominal (+) + obese,   Peds  Hematology negative hematology ROS (+)   Anesthesia Other Findings   Reproductive/Obstetrics                            Anesthesia Physical  Anesthesia Plan  ASA: IV  Anesthesia Plan: General   Post-op Pain Management:    Induction: Intravenous  PONV Risk Score and Plan: 2 and Midazolam, Ondansetron and Treatment may vary due to age or medical condition  Airway Management Planned: Oral ETT  Additional Equipment: Arterial line  Intra-op Plan:   Post-operative Plan: Post-operative intubation/ventilation  Informed Consent: I have reviewed the patients History and Physical, chart, labs and discussed the procedure including the risks, benefits and alternatives for the proposed anesthesia with the patient or authorized representative who has indicated his/her understanding and acceptance.     Dental advisory given and History available from chart only  Plan Discussed with: CRNA  Anesthesia Plan Comments:        Anesthesia Quick Evaluation

## 2020-05-15 NOTE — Progress Notes (Signed)
Pt transported from PACU to [place] 13 on the ventilator without complications.  Handoff report given to M. Walker, RRT.

## 2020-05-15 NOTE — Progress Notes (Signed)
Pt has endured traumatic intubation such that currently BP 90s O2 sats 90-91 on FiO2 1.00 and PEEP 10 w multiple secretions and "blood" in airway Will abort procedure Transfer to PACU-- anesthesia will manage airway and consult with CCM regarding extubation Will follow

## 2020-05-15 NOTE — Progress Notes (Signed)
Patient transported to 2H13, receiving RN at bedside, VS stable, called and updated wife on patient's location and visitation.   Rowe Pavy, RN

## 2020-05-15 NOTE — Anesthesia Procedure Notes (Signed)
Procedure Name: Intubation Date/Time: 05/15/2020 8:50 AM Performed by: Colin Benton, CRNA Pre-anesthesia Checklist: Patient identified, Emergency Drugs available, Suction available and Patient being monitored Patient Re-evaluated:Patient Re-evaluated prior to induction Oxygen Delivery Method: Circle system utilized Preoxygenation: Pre-oxygenation with 100% oxygen Induction Type: IV induction Ventilation: Mask ventilation with difficulty, Oral airway inserted - appropriate to patient size and Two handed mask ventilation required Laryngoscope Size: Glidescope and 4 Grade View: Grade II Tube type: Oral Tube size: 7.5 mm Number of attempts: 3 Airway Equipment and Method: Rigid stylet and Video-laryngoscopy Placement Confirmation: ETT inserted through vocal cords under direct vision,  positive ETCO2 and breath sounds checked- equal and bilateral Secured at: 25 cm Tube secured with: Tape Dental Injury: Teeth and Oropharynx as per pre-operative assessment  Difficulty Due To: Difficulty was anticipated and Difficult Airway- due to large tongue Comments: DL x 1 with Mil 2.  Grade 3 view.  DL x 2 with Mil 2. Unsuccessful. BMV with oral airway and two hand technique.  Glidescope 4 used.  Small oral opening due to large tongue.  Successful intubation. Airway suctioned.  Albuterol given.

## 2020-05-15 NOTE — Progress Notes (Signed)
Seen again x 2 most recently with DM  Had switched pt from neo>>levo Gas noted and TV had been increased CXR obliteration of R hemidiaphragm consistent with pneumonitic process, presumed aspiration  Continue vent support, CCM consulted  Anesthesia involved in discussions  Called his 'wife'

## 2020-05-15 NOTE — Consult Note (Signed)
NAME:  Jeffrey [name], MRN:  [id number], DOB:  1975/12/28, LOS: 0 ADMISSION DATE:  05/15/2020, CONSULTATION DATE:  05/15/20 REFERRING MD:  Caryl Comes  CHIEF COMPLAINT:  Vent Management   Brief History   Jeffrey [name] is a 44 y.o. male who was admitted 8/11 for elective subcutaneous ICD change out.  In OR, he had difficult airway and once airway was established, sats in 90's despite 100% FiO2 and 10 PEEP; therefore, procedure aborted. Pt left intubated and Jeffrey [name] asked to assist with vent management.  History of present illness   Pt is encephelopathic; therefore, this HPI is obtained from chart review. MILFERD [name] is a 44 y.o. male who has a PMH as outlined below.  He presented to [facility] ED 8/11 for elective subcutaneous ICD change out; however, he had difficult airway with minor trauma resulting in "blood in airway" and hypotension (although he was on 155mg/kg/min propofol).  Procedure was subsequently aborted.  Pt was transferred to PACU and Jeffrey [name] was asked to assist with vent management.  Past Medical History  has Hyperlipidemia associated with type 2 diabetes mellitus (HF[place]; Hypertension associated with diabetes (H[place]; Type 2 diabetes mellitus with stage 3 chronic kidney disease, with long-term current use of insulin (HK[place]; Morbid obesity (HM[place]; OSA (obstructive sleep apnea); Gout attack; Aspirin allergy; Renal cell carcinoma ([place]; Chest pain; Chronic systolic CHF (congestive heart failure) (HO[place]; Gouty arthritis; Cardiac defibrillator in situ; CKD (chronic kidney disease), stage III (H[place]; Hematochezia; Pollen allergy; Dyslipidemia; PAF (paroxysmal atrial fibrillation) (HE[place]; Dilated cardiomyopathy ([place]; Ventricular tachycardia (HV[place]; Lingular pneumonia; Follow-up exam; Class 3 severe obesity due to excess calories with serious comorbidity and body mass index (BMI) of 45.0 to 49.9 in adult ([facility]; Dizziness; Hyperlipidemia; Hyperosmolar (nonketotic) coma ([place]; AICD (automatic cardioverter/defibrillator)  present; Hyperglycemia; Vasectomy evaluation; Visual problems; Blurry vision, bilateral; S/P vasectomy; Anxiety; Hemoglobin A1C between 7% and 9% indicating borderline diabetic control; Seasonal allergies; Insomnia; and NICM (nonischemic cardiomyopathy) (HFriend on their problem list.  [facility]   [facility] > admit.  Consults:  None.  Procedures:  ETT 8/11 >  Art line pending 8/11 >   Significant Diagnostic Tests:  CXR 8/11 >   Micro Data:  COVID 8/9 > neg.  Antimicrobials:  Ancef and Gent pre-op.   Interim history/subjective:  Sedated, not responsive.  Objective:  Blood pressure (!) 132/96, pulse 65, temperature 98.6 F (37 C), temperature source Oral, resp. rate 17, height _0  (1.702 m), weight (!) 154 kg, SpO2 99 %.    Vent Mode: PRVC FiO2 (%):  [100 %] 100 % Set Rate:  [18 bmp] 18 bmp Vt Set:  [520 mL] 520 mL PEEP:  [10 cmH20] 10 cmH20 Plateau Pressure:  [25 cmH20] 25 cmH20   Intake/Output Summary (Last 24 hours) at 05/15/2020 1018 Last data filed at 05/15/2020 0949 Gross per 24 hour  Intake 600 ml  Output --  Net 600 ml   Filed Weights   05/15/20 0635  Weight: (!) 154 kg    Examination: General: Young adult male, in NAD. Neuro: Sedated, not responsive. HEENT: [place]/AT. Sclerae anicteric.  ETT in place. Cardiovascular: RRR, no M/R/G.  Lungs: Respirations even and unlabored.  Crackles bilaterally. Abdomen: Obese.  BS x 4, soft, NT/ND.  Musculoskeletal: No gross deformities, no edema.  Skin: Intact, warm, no rashes.  Assessment & Plan:   Acute hypoxic respiratory failure with DIFFICULT AIRWAY - multiple attempts at intubation per anesthesia resulting in minor trauma and minor blood in airway, resolved after suctioning. -  Continue full vent support. - Not a candidate for extubation until vent requirements improve (would consider / recommend moving forward with surgery for ICD change out prior to extubation given difficult airway). - Bronchial  hygiene. - Follow CXR.  Pulmonary edema - s/p 8m lasix. - Follow I/O's, might need repeat dosing. - Goal neg balance.  Hx OSA on CPAP. - Resume nocturnal CPAP once extubated.  Hx NICM with sCHF (Echo from March 2019 with EF 25 - 30%), VT s/p ICD placement (due for change out), HTN, HLD. - Per cards / EP. - Would hold home anticoagulation for now given minor airway trauma, can resume in AM if no recurrence of bleeding. - Would consider / recommend moving forward with surgery for ICD change out prior to extubation given difficult airway.  Hx DM. - SSI. - Hold home metformin, insulin.  Hx CKD. - Supportive care. - Assess CMP now.   Best Practice:  Diet: NPO. Pain/Anxiety/Delirium protocol (if indicated): Propofol gtt / Fentanyl PRN.  RASS goal 0. VAP protocol (if indicated): In place. DVT prophylaxis: SCD's. GI prophylaxis: In place. Glucose control: SSI. Mobility: Bedrest. Code Status: Full. Family Communication: Per primary. Disposition: ICU.  Labs   CBC: Recent Labs  Lab 05/10/20 1359  WBC 6.9  HGB 14.2  HCT 42.5  MCV 85  PLT 2797  Basic Metabolic Panel: Recent Labs  Lab 05/10/20 1359  NA 138  K 4.6  CL 106  CO2 19*  GLUCOSE 209*  BUN 25*  CREATININE 1.40*  CALCIUM 9.8   GFR: Estimated Creatinine Clearance: 95.5 mL/min (A) (by C-G formula based on SCr of 1.4 mg/dL (H)). Recent Labs  Lab 05/10/20 1359  WBC 6.9   Liver Function Tests: No results for input(s): AST, ALT, ALKPHOS, BILITOT, PROT, ALBUMIN in the last 168 hours. No results for input(s): LIPASE, AMYLASE in the last 168 hours. No results for input(s): AMMONIA in the last 168 hours. ABG    Component Value Date/Time   PHART 7.361 05/17/2015 0817   PCO2ART 37.3 05/17/2015 0817   PO2ART 77.0 (L) 05/17/2015 0817   HCO3 21.1 05/17/2015 0817   TCO2 22 05/17/2015 0817   ACIDBASEDEF 4.0 (H) 05/17/2015 0817   O2SAT 60.5 06/13/2017 0520    Coagulation Profile: No results for input(s):  INR, PROTIME in the last 168 hours. Cardiac Enzymes: No results for input(s): CKTOTAL, CKMB, CKMBINDEX, TROPONINI in the last 168 hours. HbA1C: Hemoglobin A1C  Date/Time Value Ref Range Status  03/19/2020 10:22 AM 8.1 (A) 4.0 - 5.6 % Final  12/18/2019 02:16 PM 7.7 (A) 4.0 - 5.6 % Final   HbA1c, POC (prediabetic range)  Date/Time Value Ref Range Status  03/19/2020 10:22 AM 8.1 (A) 5.7 - 6.4 % Final  06/19/2019 10:19 AM 6.8 (A) 5.7 - 6.4 % Final   HbA1c, POC (controlled diabetic range)  Date/Time Value Ref Range Status  03/19/2020 10:22 AM 8.1 (A) 0.0 - 7.0 % Final  06/19/2019 10:19 AM 6.8 0.0 - 7.0 % Final   HbA1c POC (<> result, manual entry)  Date/Time Value Ref Range Status  03/19/2020 10:22 AM 8.1 4.0 - 5.6 % Final  06/19/2019 10:19 AM 6.8 4.0 - 5.6 % Final   Hgb A1c MFr Bld  Date/Time Value Ref Range Status  09/18/2019 11:18 AM 7.5 (H) 4.8 - 5.6 % Final    Comment:             Prediabetes: 5.7 - 6.4  Diabetes: >6.4          Glycemic control for adults with diabetes: <7.0    CBG: Recent Labs  Lab 05/15/20 0639 05/15/20 0939  GLUCAP 162* 206*    Review of Systems:   Unable to obtain as pt is encephalopathic.  Past medical history  He,  has a past medical history of Anxiety, Aspirin allergy, Childhood asthma, Chronic systolic CHF (congestive heart failure) ([place]), CKD (chronic kidney disease) stage 2, GFR 60-89 ml/min, H/O vasectomy (12/2019), High cholesterol, Hypertension, Morbid obesity ([place]), Nephrolithiasis, OSA on CPAP, Presumed NICM, Renal cell carcinoma ([place]), Troponin level elevated, Type II diabetes mellitus ([place]), and Ventricular tachycardia ([place]).   Surgical History    Past Surgical History:  Procedure Laterality Date  . APPENDECTOMY  07/2004  . CARDIAC CATHETERIZATION N/A 05/17/2015   Procedure: Right/Left Heart Cath and Coronary Angiography;  Surgeon: Jolaine Artist, MD;  Location: [facility];  Service: Cardiovascular;  Laterality:  N/A;  . EP IMPLANTABLE DEVICE N/A 05/17/2015   Procedure: SubQ ICD Implant;  Surgeon: Will Meredith Leeds, MD;  Location: [facility];  Service: Cardiovascular;  Laterality: N/A;  . ROBOTIC ASSISTED LAPAROSCOPIC LYSIS OF ADHESION  01/13/2013   Procedure: ROBOTIC ASSISTED LAPAROSCOPIC LYSIS OF ADHESION EXTENSIVE;  Surgeon: Alexis Frock, MD;  Location: [facility];  Service: Urology;;  . ROBOTIC ASSITED PARTIAL NEPHRECTOMY Right 01/13/2013   Procedure: ROBOTIC ASSITED PARTIAL NEPHRECTOMY CONVERTED TO ROBOTIC ASSISTED RIGHT RADICAL NEPHRECTOMY;  Surgeon: Alexis Frock, MD;  Location: [facility];  Service: Urology;  Laterality: Right;  [name] VASECTOMY       Social History   reports that he quit smoking about 5 years ago. He has a 5.50 pack-year smoking history. He has never used smokeless tobacco. He reports current drug use. Frequency: 14.00 times per week. Drug: Marijuana. He reports that he does not drink alcohol.   Family history   His family history includes Diabetes in his maternal aunt; Hypertension in his mother. There is no history of Heart attack or Stroke.   Allergies Allergies  Allergen Reactions  . Aspirin Shortness Of Breath, Itching and Rash     Burning sensation (Patient reports he tolerates other NSAIDS)   . Bee Venom Hives and Swelling  . Lisinopril Cough  . Tomato Rash     Home meds  Prior to Admission medications   Medication Sig Start Date End Date Taking? Authorizing Provider  allopurinol (ZYLOPRIM) 100 MG tablet Take 1 tablet (100 mg total) by mouth daily. 09/18/19  Yes [name], FNP  amiodarone (PACERONE) 200 MG tablet Take 1 tablet (200 mg total) by mouth daily. 09/18/19  Yes [name], FNP  apixaban (ELIQUIS) 5 MG TABS tablet Take 1 tablet (5 mg total) by mouth 2 (two) times daily. 09/18/19  Yes [name], FNP  busPIRone (BUSPAR) 10 MG tablet Take 1 tablet (10 mg total) by mouth 2 (two) times daily. 09/18/19  Yes [name], FNP    carvedilol (COREG) 3.125 MG tablet Take 1 tablet (3.125 mg total) by mouth 2 (two) times daily. 09/18/19  Yes [name], FNP  cetirizine (ZYRTEC) 10 MG tablet Take 1 tablet (10 mg total) by mouth daily. 09/18/19  Yes [name], FNP  Colchicine (MITIGARE) 0.6 MG CAPS Take 1 tablet by mouth 2 (two) times daily. Patient taking differently: Take 1 tablet by mouth 2 (two) times daily as needed (gout).  09/18/19  Yes [name], FNP  fluticasone (  FLONASE) 50 MCG/ACT nasal spray Place 2 sprays into both nostrils daily. Patient taking differently: Place 2 sprays into both nostrils daily as needed for allergies.  09/18/19  Yes [name], FNP  gabapentin (NEURONTIN) 300 MG capsule Take 1 capsule (300 mg total) by mouth 3 (three) times daily. 09/18/19  Yes [name], FNP  glipiZIDE (GLUCOTROL) 10 MG tablet Take 1 tablet (10 mg total) by mouth 2 (two) times daily before a meal. 09/18/19  Yes [name], FNP  insulin aspart (NOVOLOG FLEXPEN) 100 UNIT/ML FlexPen Inject 0-15 Units into the skin 3 (three) times daily with meals. CBG 121-150: 2u; CBG 151-200: 3u; CBG 201-250: 5u; CBG 251-300: 8u; CBG 301-350: 11u; CBG 351-400:15 u; CBG>400: 15u 09/18/19  Yes [name], FNP  insulin NPH-regular Human (70-30) 100 UNIT/ML injection Inject 38 Units into the skin 2 (two) times daily with a meal. 09/18/19  Yes [name], FNP  isosorbide-hydrALAZINE (BIDIL) 20-37.5 MG tablet Take 1 tablet by mouth 3 (three) times daily. Needs appt 12/18/19  Yes [name], FNP  Magnesium Oxide 200 MG TABS Take 100 mg by mouth daily.   Yes [provider]  meclizine (ANTIVERT) 25 MG tablet Take 1 tablet (25 mg total) by mouth 3 (three) times daily. 09/18/19  Yes [name], FNP  metFORMIN (GLUCOPHAGE) 1000 MG tablet Take 1 tablet (1,000 mg total) by mouth 2 (two) times daily with a meal. 09/18/19  Yes [name], FNP  sacubitril-valsartan (ENTRESTO)  97-103 MG Take 1 tablet by mouth 2 (two) times daily. 09/18/19  Yes [name], FNP  spironolactone (ALDACTONE) 25 MG tablet Take 0.5 tablets (12.5 mg total) by mouth daily. 09/18/19 06/09/21 Yes [name], FNP  torsemide (DEMADEX) 20 MG tablet Take 2 tablets (40 mg total) by mouth daily. 09/18/19  Yes [name], FNP  traZODone (DESYREL) 100 MG tablet Take 1 tablet (100 mg total) by mouth at bedtime. For sleep Patient taking differently: Take 100 mg by mouth at bedtime as needed for sleep. For sleep 12/18/19  Yes [name], FNP  Vitamin D, Ergocalciferol, (DRISDOL) 1.25 MG (50000 UT) CAPS capsule Take 1 capsule (50,000 Units total) by mouth once a week. 09/18/19  Yes [name], FNP  Accu-Chek FastClix Lancets MISC USE AS DIRECTED FOUR TIMES DAILY 12/18/19   [name], FNP  blood glucose meter kit and supplies Dispense based on patient and insurance preference. Use up to four times daily as directed. (FOR ICD-10 E10.9, E11.9). 01/23/19   [name], FNP  Continuous Blood Gluc Receiver (FREESTYLE LIBRE 14 DAY READER) DEVI 1 each by Does not apply route 3 (three) times daily between meals as needed. 03/19/20   [name], FNP  Continuous Blood Gluc Sensor (GUARDIAN SENSOR 3) MISC 1 Device by Does not apply route 3 (three) times daily between meals as needed. 03/25/20   [name], FNP  glucose blood (ACCU-CHEK GUIDE) test strip USE AS DIRECTED FOUR TIMES DAILY 12/18/19   [name], FNP  Insulin Pen Needle (TRUEPLUS 5-BEVEL PEN NEEDLES) 31G X 6 MM MISC 1 Syringe by Does not apply route as needed. 05/10/19   [name], FNP  Insulin Syringe-Needle U-100 (TRUEPLUS INSULIN SYRINGE) 31G X 5/16" 0.5 ML MISC Use to inject insulin twice daily. 06/09/19   Charlott Rakes, MD    Critical care time: 35 min.    Montey Hora, PA - [facility] 05/15/2020, 10:18 AM

## 2020-05-15 NOTE — Plan of Care (Signed)
Pt will remain intubated at this time, Urology placed foley.  Mother to bedside and updated.  Problem: Clinical Measurements: Goal: Diagnostic test results will improve Outcome: Not Progressing   Problem: Clinical Measurements: Goal: Respiratory complications will improve Outcome: Not Progressing

## 2020-05-15 NOTE — Interval H&P Note (Signed)
ICD Criteria  Current LVEF:25%. Within 12 months prior to implant: No   Heart failure history: Yes, Class II  Cardiomyopathy history: Yes, Non-Ischemic Cardiomyopathy.  Atrial Fibrillation/Atrial Flutter: No.  Ventricular tachycardia history: Yes, Hemodynamic instability present. VT Type: Sustained Ventricular Tachycardia - Monomorphic.  Cardiac arrest history: No.  History of syndromes with risk of sudden death: No.  Previous ICD: Yes, Reason for ICD:  Primary prevention.  Current ICD indication: Secondary  PPM indication: No.  Class I or II Bradycardia indication present: No  Beta Blocker therapy for 3 or more months: Yes, prescribed.   Ace Inhibitor/ARB therapy for 3 or more months: Yes, prescribed.    I have seen Jeffrey [name] is a 44 y.o. malepre-procedural and has been admitted for consideration of ICD reimplant for secondary prevention of sudden death.  The patient's chart has been reviewed and they meet criteria for ICD implant.  I have had a thorough discussion with the patient reviewing options.  The patient and their family (if available) have had opportunities to ask questions and have them answered. The patient and I have decided together through the [place] [employer] to reimplant ICD at this time.  Risks, benefits, alternatives to ICD implantation were discussed in detail with the patient today. The patient  understands that the risks include but are not limited to bleeding, infection, pneumothorax, perforation, tamponade, vascular damage, renal failure, MI, stroke, death, inappropriate shocks, and lead dislodgement and  wishes to proceed.  History and Physical Interval Note:  05/15/2020 8:05 AM  Jeffrey [name]  has presented today for surgery, with the diagnosis of Non ischemic Cardiomyopathy.  The various methods of treatment have been discussed with the patient and family. After consideration of risks, benefits and other options for treatment,  the patient has consented to  Procedure(s): SUBQ ICD CHANGEOUT (N/A) as a surgical intervention.  The patient's history has been reviewed, patient examined, no change in status, stable for surgery.  I have reviewed the patient's chart and labs.  Questions were answered to the patient's satisfaction.     Virl Axe

## 2020-05-15 NOTE — Interval H&P Note (Signed)
History and Physical Interval Note:  05/15/2020 8:08 AM  Jeffrey [name]  has presented today for surgery, with the diagnosis of Non ischemic Cardiomyopathy.  The various methods of treatment have been discussed with the patient and family. After consideration of risks, benefits and other options for treatment, the patient has consented to  Procedure(s): SUBQ ICD CHANGEOUT (N/A) as a surgical intervention.  The patient's history has been reviewed, patient examined, no change in status, stable for surgery.  I have reviewed the patient's chart and labs.  Questions were answered to the patient's satisfaction.     Virl Axe BP (!) 132/96   Pulse 65   Temp 98.6 F (37 C) (Oral)   Resp 17   Ht 5\' 7"  (1.702 m)   Wt (!) 154 kg   SpO2 99%   BMI 53.17 kg/m  Well developed and nourished in no acute distress HENT normal Neck supple with JVP-  flat   Clear Regular rate and rhythm, no murmurs or gallops Abd-soft with active BS No Clubbing cyanosis edema Skin-warm and dry A & Oriented  Grossly normal sensory and motor function

## 2020-05-15 NOTE — Transfer of Care (Signed)
Immediate Anesthesia Transfer of Care Note  Patient: Jeffrey [name]  Procedure(s) Performed: SUBQ ICD CHANGEOUT (N/A )  Patient Location: PACU  Anesthesia Type:General  Level of Consciousness: sedated and Patient remains intubated per anesthesia plan  Airway & Oxygen Therapy: Patient remains intubated per anesthesia plan and Patient placed on Ventilator (see vital sign flow sheet for setting)  Post-op Assessment: Report given to RN and Post -op Vital signs reviewed and stable  Post vital signs: Reviewed and stable  Last Vitals:  Vitals Value Taken Time  BP    Temp    Pulse    Resp    SpO2      Last Pain:  Vitals:   05/15/20 0703  TempSrc:   PainSc: 0-No pain         Complications: No complications documented.

## 2020-05-15 NOTE — Anesthesia Postprocedure Evaluation (Signed)
Anesthesia Post Note  Patient: Jeffrey [name]  Procedure(s) Performed: [place] (N/A )     Patient location during evaluation: PACU Anesthesia Type: General Level of consciousness: patient remains intubated per anesthesia plan Pain management: pain level controlled Vital Signs Assessment: post-procedure vital signs reviewed and stable Respiratory status: patient remains intubated per anesthesia plan and patient on ventilator - see flowsheet for VS Cardiovascular status: bradycardic (requiring levophed) Postop Assessment: no headache Anesthetic complications: yes Comments: Unplanned cancellation of procedure following difficult intubation requiring MDA intubation with glidescope and likely aspiration event requiring ETT suction.  After intubation, patient required full ventilatory support and decision made to cancel procedure and take patient to PACU intubated, sedated. CXR, ABG obtained in PACU, foley placed for I/Os.  Propofol gtt for sedation,  and levophed gtt for hypotension per Dr Caryl Comes.  CCM consulted to assist with further vent management.     Encounter Complications  Complication Outcome Phase Comment  Difficult to intubate - expected  Intraprocedure   Difficult to intubate - expected  Intraprocedure     Last Vitals:  Vitals:   05/15/20 1130 05/15/20 1145  BP:    Pulse: (!) 53 (!) 55  Resp: 18 (!) 24  Temp:    SpO2: 100% 100%    Last Pain:  Vitals:   05/15/20 0703  TempSrc:   PainSc: 0-No pain                 Catalina Gravel

## 2020-05-15 NOTE — Anesthesia Procedure Notes (Signed)
Arterial Line Insertion Start/End8/08/2020 10:20 AM, 05/15/2020 10:25 AM Performed by: Colin Benton, CRNA, CRNA  Patient location: PACU. Preanesthetic checklist: patient identified, IV checked, site marked, risks and benefits discussed, surgical consent, monitors and equipment checked, pre-op evaluation, timeout performed and anesthesia consent Lidocaine 1% used for infiltration Right, radial was placed Catheter size: 20 G Hand hygiene performed , maximum sterile barriers used  and Seldinger technique used Allen's test indicative of satisfactory collateral circulation Attempts: 1 Procedure performed without using ultrasound guided technique. Following insertion, dressing applied and Biopatch. Post procedure assessment: normal and unchanged  Patient tolerated the procedure well with no immediate complications.

## 2020-05-15 NOTE — Progress Notes (Signed)
Pt remains intubated,  Levophed at 2 mcg FiO2 just 70>>50%  PEEP at 10 Bladder scan 800+ cc, foley dry removed Urology consulted Spoke with CCM re weaning Question raised again as to whether his procedure should be done prior to extubation or done with local   Will reassess In am  Spoke with "wife" and she and his mom have thought in terms of doing the procedure prior to extubation Will tentatvely schedule for am  27 min

## 2020-05-15 NOTE — Progress Notes (Signed)
Spoke with urology MD, they just got out of the OR and will meet the patient in his room on 2H, cart and scope transported to 2H13.  Rowe Pavy, RN

## 2020-05-15 NOTE — Progress Notes (Signed)
Received patient, per report F/C placed x2 with NO urine output. Urology was consulted to place foley.  Patient had received 80mg  of Lasix prior to arrival to unit and scanned for >800cc of urine.  Patient RASS -1 on Propofol and Fentanyl gtt.  Levophed gtt to maintain MAP >65.  Patient is SB on monitor, and according to Dr. Jens Som he has a history of SB. Please refer to flowsheet for further assessment.

## 2020-05-15 NOTE — Consult Note (Signed)
Urology Consult  Referring physician: Olin Pia Reason for referral: Retention  Chief Complaint: retention  History of Present Illness: Acute intubation; unable to pass catheter and scanned for 800 ml; no voiding history  Past Medical History:  Diagnosis Date  . Anxiety   . Aspirin allergy   . Childhood asthma   . Chronic systolic CHF (congestive heart failure) ([place])    a. EF 20-25% in 2012. b. EF 45-50% in 10/2011 with nonischemic nuc - presumed NICM. c. 12/2014 Echo: Sev depressed LV fxn, sev dil LV, mild LVH, mild MR, sev dil LA, mildly reduced RV fxn.  . CKD (chronic kidney disease) stage 2, GFR 60-89 ml/min   . H/O vasectomy 12/2019  . High cholesterol   . Hypertension   . Morbid obesity ([place])   . Nephrolithiasis   . OSA on CPAP   . Presumed NICM    a. 04/2014 Myoview: EF 26%, glob HK, sev glob HK, ? prior infarct;  b. Never cathed 2/2 CKD.  [name] Renal cell carcinoma ([place])    a. s/p Rt robotic assisted partial converted to radical nephrectomy on 01/2013.  . Troponin level elevated    a. 04/2014, 12/2014: felt due to CHF.  . Type II diabetes mellitus ([place])   . Ventricular tachycardia ([place])    a. appropriate ICD therapy 12/2017   Past Surgical History:  Procedure Laterality Date  . APPENDECTOMY  07/2004  . CARDIAC CATHETERIZATION N/A 05/17/2015   Procedure: Right/Left Heart Cath and Coronary Angiography;  Surgeon: Jolaine Artist, MD;  Location: [place] [facility];  Service: Cardiovascular;  Laterality: N/A;  . EP IMPLANTABLE DEVICE N/A 05/17/2015   Procedure: SubQ ICD Implant;  Surgeon: Will Meredith Leeds, MD;  Location: [place] [facility];  Service: Cardiovascular;  Laterality: N/A;  . ROBOTIC ASSISTED LAPAROSCOPIC LYSIS OF ADHESION  01/13/2013   Procedure: ROBOTIC ASSISTED LAPAROSCOPIC LYSIS OF ADHESION EXTENSIVE;  Surgeon: Alexis Frock, MD;  Location: [facility];  Service: Urology;;  . ROBOTIC ASSITED PARTIAL NEPHRECTOMY Right 01/13/2013   Procedure: ROBOTIC ASSITED PARTIAL  NEPHRECTOMY CONVERTED TO ROBOTIC ASSISTED RIGHT RADICAL NEPHRECTOMY;  Surgeon: Alexis Frock, MD;  Location: [facility];  Service: Urology;  Laterality: Right;  [name] VASECTOMY      Medications: I have reviewed the patient's current medications. Allergies:  Allergies  Allergen Reactions  . Aspirin Shortness Of Breath, Itching and Rash     Burning sensation (Patient reports he tolerates other NSAIDS)   . Bee Venom Hives and Swelling  . Lisinopril Cough  . Tomato Rash    Family History  Problem Relation Age of Onset  . Hypertension Mother   . Diabetes Maternal Aunt   . Heart attack Neg Hx   . Stroke Neg Hx    Social History:  reports that he quit smoking about 5 years ago. He has a 5.50 pack-year smoking history. He has never used smokeless tobacco. He reports current drug use. Frequency: 14.00 times per week. Drug: Marijuana. He reports that he does not drink alcohol.  ROS: All systems are reviewed and negative except as noted. Rest negative  Physical Exam:  Vital signs in last 24 hours: Temp:  [97.7 F (36.5 C)-98.6 F (37 C)] 97.9 F (36.6 C) (08/11 1631) Pulse Rate:  [48-112] 51 (08/11 1515) Resp:  [4-24] 24 (08/11 1515) BP: (85-132)/(62-96) 103/73 (08/11 1510) SpO2:  [99 %-100 %] 100 % (08/11 1515) Arterial Line BP: (79-121)/(44-78) 95/57 (08/11 1515) FiO2 (%):  [50 %-100 %] 50 % (08/11 1429)  Weight:  [154 kg] 154 kg (08/11 2831)  Cardiovascular: Skin warm; not flushed Respiratory: Breaths quiet; no shortness of breath Abdomen: No masses Neurological: Normal sensation to touch Musculoskeletal: Normal motor function arms and legs Lymphatics: No inguinal adenopathy Skin: No rashes Genitourinary:normal genitalia  Laboratory Data:  Results for orders placed or performed during the hospital encounter of 05/15/20 (from the past 72 hour(s))  Glucose, capillary     Status: Abnormal   Collection Time: 05/15/20  6:39 AM  Result Value Ref Range   Glucose-Capillary 162 (H) 70 -  99 mg/dL    Comment: Glucose reference range applies only to samples taken after fasting for at least 8 hours.   Comment 1 Notify RN    Comment 2 Document in Chart   Glucose, capillary     Status: Abnormal   Collection Time: 05/15/20  9:39 AM  Result Value Ref Range   Glucose-Capillary 206 (H) 70 - 99 mg/dL    Comment: Glucose reference range applies only to samples taken after fasting for at least 8 hours.  I-STAT 7, (LYTES, BLD GAS, ICA, H+H)     Status: Abnormal   Collection Time: 05/15/20 10:27 AM  Result Value Ref Range   pH, Arterial 7.231 (L) 7.35 - 7.45   pCO2 arterial 52.7 (H) 32 - 48 mmHg   pO2, Arterial 83 83 - 108 mmHg   Bicarbonate 22.1 20.0 - 28.0 mmol/L   TCO2 24 22 - 32 mmol/L   O2 Saturation 94.0 %   Acid-base deficit 6.0 (H) 0.0 - 2.0 mmol/L   Sodium 143 135 - 145 mmol/L   Potassium 4.8 3.5 - 5.1 mmol/L   Calcium, Ion 1.27 1.15 - 1.40 mmol/L   HCT 40.0 39 - 52 %   Hemoglobin 13.6 13.0 - 17.0 g/dL   Sample type ARTERIAL   Draw ABG 1 hour after initiation of ventilator     Status: Abnormal   Collection Time: 05/15/20 10:46 AM  Result Value Ref Range   FIO2 100.00    pH, Arterial 7.289 (L) 7.35 - 7.45   pCO2 arterial 44.5 32 - 48 mmHg   pO2, Arterial 138 (H) 83 - 108 mmHg   Bicarbonate 20.9 20.0 - 28.0 mmol/L   Acid-base deficit 4.7 (H) 0.0 - 2.0 mmol/L   O2 Saturation 98.2 %   Patient temperature 36.5    Collection site A-LINE    Drawn by [id number]     Comment: DRAWN BY RN   Sample type ARTERIAL DRAW    Allens test (pass/fail) A-LINE (A) PASS    Comment: Performed at [facility], [place] [address], [place] [address]  CBC     Status: None   Collection Time: 05/15/20  3:50 PM  Result Value Ref Range   WBC 10.1 4.0 - 10.5 K/uL   RBC 4.90 4.22 - 5.81 MIL/uL   Hemoglobin 13.8 13.0 - 17.0 g/dL   HCT 41.9 39 - 52 %   MCV 85.5 80.0 - 100.0 fL   MCH 28.2 26.0 - 34.0 pg   MCHC 32.9 30.0 - 36.0 g/dL   RDW 14.7 11.5 - 15.5 %   Platelets 288 150 - 400  K/uL   nRBC 0.0 0.0 - 0.2 %    Comment: Performed at [facility], [place] [address], [place], [place] [address]  Glucose, capillary     Status: Abnormal   Collection Time: 05/15/20  4:27 PM  Result Value Ref Range   Glucose-Capillary  292 (H) 70 - 99 mg/dL    Comment: Glucose reference range applies only to samples taken after fasting for at least 8 hours.   Recent Results (from the past 240 hour(s))  SARS CORONAVIRUS 2 (TAT 6-24 HRS) Nasopharyngeal Nasopharyngeal Swab     Status: None   Collection Time: 05/13/20  1:12 PM   Specimen: Nasopharyngeal Swab  Result Value Ref Range Status   SARS Coronavirus 2 NEGATIVE NEGATIVE Final    Comment: (NOTE) SARS-CoV-2 target nucleic acids are NOT DETECTED.  The SARS-CoV-2 RNA is generally detectable in upper and lower respiratory specimens during the acute phase of infection. Negative results do not preclude SARS-CoV-2 infection, do not rule out co-infections with other pathogens, and should not be used as the sole basis for treatment or other patient management decisions. Negative results must be combined with clinical observations, patient history, and epidemiological information. The expected result is Negative.  Fact Sheet for Patients: SugarRoll.be  Fact Sheet for Healthcare Providers: https://www.woods-mathews.com/  This test is not yet approved or cleared by the [place] FDA and  has been authorized for detection and/or diagnosis of SARS-CoV-2 by FDA under an Emergency Use Authorization (EUA). This EUA will remain  in effect (meaning this test can be used) for the duration of the COVID-19 declaration under Se ction 564(b)(1) of the Act, 21 U.S.C. section 360bbb-3(b)(1), unless the authorization is terminated or revoked sooner.  Performed at [facility], [place] [address], [place], [place] [address]    Creatinine: Recent Labs    05/10/20 1359  CREATININE 1.40*     Xrays: See report/chart   Impression/Assessment:  #14 Fr coude placed easily  Plan:  Trial of voiding when ready to go home  [place] 05/15/2020, 4:47 PM

## 2020-05-16 ENCOUNTER — Inpatient Hospital Stay (HOSPITAL_COMMUNITY): Payer: Medicaid Other

## 2020-05-16 ENCOUNTER — Encounter (HOSPITAL_COMMUNITY)
Admission: RE | Disposition: A | Discharge status: Home / Self Care | Payer: Self-pay | Source: Home / Self Care | Attending: Internal Medicine

## 2020-05-16 ENCOUNTER — Encounter (HOSPITAL_COMMUNITY): Payer: Self-pay | Admitting: Internal Medicine

## 2020-05-16 ENCOUNTER — Inpatient Hospital Stay (HOSPITAL_COMMUNITY): Payer: Medicaid Other | Admitting: Anesthesiology

## 2020-05-16 DIAGNOSIS — I428 Other cardiomyopathies: Secondary | ICD-10-CM

## 2020-05-16 DIAGNOSIS — Z4502 Encounter for adjustment and management of automatic implantable cardiac defibrillator: Secondary | ICD-10-CM | POA: Diagnosis not present

## 2020-05-16 DIAGNOSIS — I130 Hypertensive heart and chronic kidney disease with heart failure and stage 1 through stage 4 chronic kidney disease, or unspecified chronic kidney disease: Secondary | ICD-10-CM | POA: Diagnosis not present

## 2020-05-16 DIAGNOSIS — T17908A Unspecified foreign body in respiratory tract, part unspecified causing other injury, initial encounter: Secondary | ICD-10-CM

## 2020-05-16 DIAGNOSIS — J9602 Acute respiratory failure with hypercapnia: Secondary | ICD-10-CM | POA: Diagnosis not present

## 2020-05-16 DIAGNOSIS — F419 Anxiety disorder, unspecified: Secondary | ICD-10-CM | POA: Diagnosis not present

## 2020-05-16 DIAGNOSIS — I472 Ventricular tachycardia: Secondary | ICD-10-CM | POA: Diagnosis not present

## 2020-05-16 DIAGNOSIS — J810 Acute pulmonary edema: Secondary | ICD-10-CM | POA: Diagnosis not present

## 2020-05-16 DIAGNOSIS — I5022 Chronic systolic (congestive) heart failure: Secondary | ICD-10-CM | POA: Diagnosis not present

## 2020-05-16 DIAGNOSIS — Z6841 Body Mass Index (BMI) 40.0 and over, adult: Secondary | ICD-10-CM | POA: Diagnosis not present

## 2020-05-16 DIAGNOSIS — J9601 Acute respiratory failure with hypoxia: Secondary | ICD-10-CM | POA: Diagnosis not present

## 2020-05-16 DIAGNOSIS — I480 Paroxysmal atrial fibrillation: Secondary | ICD-10-CM | POA: Diagnosis not present

## 2020-05-16 DIAGNOSIS — Z20822 Contact with and (suspected) exposure to covid-19: Secondary | ICD-10-CM | POA: Diagnosis not present

## 2020-05-16 DIAGNOSIS — N182 Chronic kidney disease, stage 2 (mild): Secondary | ICD-10-CM | POA: Diagnosis not present

## 2020-05-16 DIAGNOSIS — J9811 Atelectasis: Secondary | ICD-10-CM | POA: Diagnosis not present

## 2020-05-16 DIAGNOSIS — J9690 Respiratory failure, unspecified, unspecified whether with hypoxia or hypercapnia: Secondary | ICD-10-CM | POA: Diagnosis not present

## 2020-05-16 DIAGNOSIS — T82191A Other mechanical complication of cardiac pulse generator (battery), initial encounter: Secondary | ICD-10-CM | POA: Diagnosis not present

## 2020-05-16 DIAGNOSIS — E785 Hyperlipidemia, unspecified: Secondary | ICD-10-CM | POA: Diagnosis not present

## 2020-05-16 DIAGNOSIS — J690 Pneumonitis due to inhalation of food and vomit: Secondary | ICD-10-CM | POA: Diagnosis not present

## 2020-05-16 HISTORY — PX: SUBQ ICD CHANGEOUT: EP1235

## 2020-05-16 LAB — COMPREHENSIVE METABOLIC PANEL
ALT: 23 U/L (ref 0–44)
AST: 19 U/L (ref 15–41)
Albumin: 3 g/dL — ABNORMAL LOW (ref 3.5–5.0)
Alkaline Phosphatase: 42 U/L (ref 38–126)
Anion gap: 10 (ref 5–15)
BUN: 31 mg/dL — ABNORMAL HIGH (ref 6–20)
CO2: 20 mmol/L — ABNORMAL LOW (ref 22–32)
Calcium: 8.9 mg/dL (ref 8.9–10.3)
Chloride: 108 mmol/L (ref 98–111)
Creatinine, Ser: 1.83 mg/dL — ABNORMAL HIGH (ref 0.61–1.24)
GFR calc Af Amer: 51 mL/min — ABNORMAL LOW (ref >60)
GFR calc non Af Amer: 44 mL/min — ABNORMAL LOW (ref >60)
Glucose, Bld: 210 mg/dL — ABNORMAL HIGH (ref 70–99)
Potassium: 4.4 mmol/L (ref 3.5–5.1)
Sodium: 138 mmol/L (ref 135–145)
Total Bilirubin: 0.4 mg/dL (ref 0.3–1.2)
Total Protein: 6.2 g/dL — ABNORMAL LOW (ref 6.5–8.1)

## 2020-05-16 LAB — CBC
HCT: 39.5 % (ref 39.0–52.0)
Hemoglobin: 12.7 g/dL — ABNORMAL LOW (ref 13.0–17.0)
MCH: 27.1 pg (ref 26.0–34.0)
MCHC: 32.2 g/dL (ref 30.0–36.0)
MCV: 84.2 fL (ref 80.0–100.0)
Platelets: 265 10*3/uL (ref 150–400)
RBC: 4.69 MIL/uL (ref 4.22–5.81)
RDW: 14.6 % (ref 11.5–15.5)
WBC: 8.6 10*3/uL (ref 4.0–10.5)
nRBC: 0 % (ref 0.0–0.2)

## 2020-05-16 LAB — GLUCOSE, CAPILLARY
Glucose-Capillary: 123 mg/dL — ABNORMAL HIGH (ref 70–99)
Glucose-Capillary: 125 mg/dL — ABNORMAL HIGH (ref 70–99)
Glucose-Capillary: 144 mg/dL — ABNORMAL HIGH (ref 70–99)
Glucose-Capillary: 152 mg/dL — ABNORMAL HIGH (ref 70–99)
Glucose-Capillary: 178 mg/dL — ABNORMAL HIGH (ref 70–99)
Glucose-Capillary: 208 mg/dL — ABNORMAL HIGH (ref 70–99)

## 2020-05-16 LAB — SURGICAL PCR SCREEN
MRSA, PCR: NEGATIVE
Staphylococcus aureus: POSITIVE — AB

## 2020-05-16 SURGERY — SUBQ ICD CHANGEOUT
Anesthesia: General

## 2020-05-16 MED ORDER — TORSEMIDE 20 MG PO TABS
40.0000 mg | ORAL_TABLET | Freq: Every day | ORAL | Status: DC
  Administered 2020-05-16 – 2020-05-17 (×2): 40 mg via ORAL
  Filled 2020-05-16 (×2): qty 2

## 2020-05-16 MED ORDER — MIDAZOLAM HCL 5 MG/5ML IJ SOLN
INTRAMUSCULAR | Status: AC
  Filled 2020-05-16: qty 5

## 2020-05-16 MED ORDER — SODIUM CHLORIDE 0.9 % IV SOLN
INTRAVENOUS | Status: AC
  Filled 2020-05-16: qty 2

## 2020-05-16 MED ORDER — INSULIN DETEMIR 100 UNIT/ML ~~LOC~~ SOLN
10.0000 [IU] | Freq: Two times a day (BID) | SUBCUTANEOUS | Status: DC
  Administered 2020-05-16 – 2020-05-17 (×3): 10 [IU] via SUBCUTANEOUS
  Filled 2020-05-16 (×4): qty 0.1

## 2020-05-16 MED ORDER — SODIUM CHLORIDE 0.9 % IV SOLN: INTRAVENOUS | Status: DC | PRN

## 2020-05-16 MED ORDER — BUPIVACAINE HCL (PF) 0.25 % IJ SOLN
INTRAMUSCULAR | Status: DC | PRN
  Administered 2020-05-16: 60 mL

## 2020-05-16 MED ORDER — MIDAZOLAM HCL 5 MG/5ML IJ SOLN
INTRAMUSCULAR | Status: DC | PRN
Start: 2020-05-16 — End: 2020-05-16
  Administered 2020-05-16: 2 mg via INTRAVENOUS

## 2020-05-16 MED ORDER — SODIUM CHLORIDE 0.9 % IV SOLN
2.0000 g | INTRAVENOUS | Status: DC
  Administered 2020-05-16: 2 g via INTRAVENOUS
  Filled 2020-05-16: qty 2
  Filled 2020-05-16: qty 20

## 2020-05-16 MED ORDER — ACETAMINOPHEN 325 MG PO TABS
650.0000 mg | ORAL_TABLET | ORAL | Status: DC | PRN
  Administered 2020-05-16 – 2020-05-17 (×3): 650 mg via ORAL
  Filled 2020-05-16 (×3): qty 2

## 2020-05-16 MED ORDER — MUPIROCIN 2 % EX OINT
1.0000 "application " | TOPICAL_OINTMENT | Freq: Two times a day (BID) | CUTANEOUS | Status: DC
  Administered 2020-05-16 – 2020-05-17 (×3): 1 via NASAL
  Filled 2020-05-16: qty 22

## 2020-05-16 SURGICAL SUPPLY — 4 items
CABLE SURGICAL S-101-97-12 (CABLE) ×3 IMPLANT
ICD SUBCU MRI EMBLEM A219 (ICD Generator) ×2 IMPLANT
PAD PRO RADIOLUCENT 2001M-C (PAD) ×3 IMPLANT
TRAY PACEMAKER INSERTION (PACKS) ×3 IMPLANT

## 2020-05-16 NOTE — Procedures (Signed)
Extubation Procedure Note  Patient Details:   Name: Jeffrey [name] DOB: 02-10-1976 MRN: [id number]   Airway Documentation:    Vent end date: 05/16/20 Vent end time: 1452   Evaluation  O2 sats: stable throughout Complications: No apparent complications Patient did tolerate procedure well. Bilateral Breath Sounds: Diminished   Yes  Pt extubated to 5L [place] per MD order. No apparent complications. Positive cuff leak.   Esperanza Sheets T 05/16/2020, 2:56 PM

## 2020-05-16 NOTE — Progress Notes (Signed)
eLink Physician-Brief Progress Note Patient Name: Jeffrey [name] DOB: January 13, 1976 MRN: [id number]   Date of Service  05/16/2020  HPI/Events of Note  Patient is having some discomfort at the arterial line site and wants it discontinued, he is off pressors.  eICU Interventions  Order to discontinue arterial line entered.        Kerry Kass Linnette Panella 05/16/2020, 8:09 PM

## 2020-05-16 NOTE — Transfer of Care (Signed)
Immediate Anesthesia Transfer of Care Note  Patient: Jeffrey [name]  Procedure(s) Performed: SUBQ ICD [place] (N/A )  Patient Location: ICU  Anesthesia Type:General  Level of Consciousness: responds to stimulation  Airway & Oxygen Therapy: Patient remains intubated per anesthesia plan and Patient placed on Ventilator (see vital sign flow sheet for setting)  Post-op Assessment: Report given to RN and Post -op Vital signs reviewed and stable  Post vital signs: Reviewed and stable  Last Vitals:  Vitals Value Taken Time  BP    Temp    Pulse    Resp    SpO2      Last Pain:  Vitals:   05/16/20 0300  TempSrc: Oral  PainSc:          Complications: There were no known complications for this encounter.

## 2020-05-16 NOTE — Progress Notes (Signed)
NAME:  Jeffrey [name], MRN:  [id number], DOB:  1975-12-01, LOS: 1 ADMISSION DATE:  05/15/2020, CONSULTATION DATE:  05/15/20 REFERRING MD:  Caryl Comes  CHIEF COMPLAINT:  Vent Management   Brief History   Jeffrey [name] is a 44 y.o. male who was admitted 8/11 for elective subcutaneous ICD change out.  In OR, he had difficult airway and once airway was established, sats in 90's despite 100% FiO2 and 10 PEEP; therefore, procedure aborted. Pt left intubated and PCCM asked to assist with vent management.    Past Medical History   HFrEF, nonischemic CKD stage III Morbid obesity Diabetes type 2   Significant Hospital Events   8/11 > admit.  Consults:  None.  Procedures:  ETT 8/11 >  Art line pending 8/11 >   Significant Diagnostic Tests:    Micro Data:  COVID 8/9 > neg.  Antimicrobials:  Ancef and Gent pre-op.  Ceftriaxone 8/12 >>  Interim history/subjective:   Critically ill, intubated Sedated on propofol and fentanyl On PEEP of 10 Good urine output Hemodynamically stable  Objective:  Blood pressure 91/61, pulse (!) 51, temperature 98.7 F (37.1 C), resp. rate (!) 24, height 5\' 7"  (1.702 m), weight (!) 151.6 kg, SpO2 100 %.    Vent Mode: PRVC FiO2 (%):  [40 %-70 %] 40 % Set Rate:  [24 bmp] 24 bmp Vt Set:  [500 mL] 500 mL PEEP:  [10 cmH20] 10 cmH20 Plateau Pressure:  [21 cmH20-23 cmH20] 22 cmH20   Intake/Output Summary (Last 24 hours) at 05/16/2020 1147 Last data filed at 05/16/2020 1100 Gross per 24 hour  Intake 2066.2 ml  Output 2205 ml  Net -138.8 ml   Filed Weights   05/15/20 0635 05/16/20 0321  Weight: (!) 154 kg (!) 151.6 kg    Examination: General: Young adult male, in NAD. Neuro: Induced coma, RASS -2 HEENT: [place]/AT. Sclerae anicteric.  ETT in place.  Short neck Cardiovascular: RRR, no M/R/G.  Lungs: Bilateral ventilated breath sounds, no accessory muscle use Abdomen: Obese.  BS x 4, soft, NT/ND.  Musculoskeletal: No gross deformities, no edema.  Skin:  Intact, warm, no rashes.  Chest x-ray/12 personally reviewed which shows right lower lobe infiltrate.  Labs show normal electrolytes, stable renal function, no leukocytosis  Assessment & Plan:   Acute hypoxic respiratory failure with DIFFICULT AIRWAY - multiple attempts at intubation per anesthesia resulting in minor trauma and minor blood in airway, resolved after suctioning. -Aspiration pneumonia - Continue full vent support. -Empiric ceftriaxone given new right lower lobe infiltrate, obtain respiratory culture but more likely aspiration of blood -Can consider extubation after procedure  Pulmonary edema -good response to Lasix 8/11 -Redose for negative balance   Hx OSA on CPAP. - Resume nocturnal CPAP once extubated.  Hx NICM with sCHF (Echo from March 2019 with EF 25 - 30%), VT s/p ICD placement (due for change out), HTN, HLD. - Per cards / EP. -Discussed with Dr. Caryl Comes, can proceed with ICD change out -Can resume apixaban after procedure   Hx DM. - SSI. - Hold home metformin, insulin.  Hx CKD. -Resume home dose of torsemide - Assess CMP now.   Best Practice:  Diet: NPO. Pain/Anxiety/Delirium protocol (if indicated): Propofol gtt / Fentanyl PRN.  RASS goal 0. VAP protocol (if indicated): In place. DVT prophylaxis: SCD's. GI prophylaxis: In place. Glucose control: SSI. Mobility: Bedrest. Code Status: Full. Family Communication: Per primary. Disposition: ICU.  Labs   CBC: Recent Labs  Lab 05/10/20 1359 05/15/20 1027  05/15/20 1550 05/16/20 0410  WBC 6.9  --  10.1 8.6  HGB 14.2 13.6 13.8 12.7*  HCT 42.5 40.0 41.9 39.5  MCV 85  --  85.5 84.2  PLT 277  --  288 631   Basic Metabolic Panel: Recent Labs  Lab 05/10/20 1359 05/15/20 1027 05/15/20 1550 05/16/20 0410  NA 138 143 135 138  K 4.6 4.8 5.9* 4.4  CL 106  --  106 108  CO2 19*  --  18* 20*  GLUCOSE 209*  --  335* 210*  BUN 25*  --  32* 31*  CREATININE 1.40*  --  1.85* 1.83*  CALCIUM 9.8  --   9.2 8.9   GFR: Estimated Creatinine Clearance: 72.3 mL/min (A) (by C-G formula based on SCr of 1.83 mg/dL (H)). Recent Labs  Lab 05/10/20 1359 05/15/20 1550 05/16/20 0410  WBC 6.9 10.1 8.6    The patient is critically ill with multiple organ systems failure and requires high complexity decision making for assessment and support, frequent evaluation and titration of therapies, application of advanced monitoring technologies and extensive interpretation of multiple databases. Critical Care Time devoted to patient care services described in this note independent of APP/resident  time is 32 minutes.   Kara Mead MD. [place]. [place] [facility]  If no response to pager , please call [phone]     05/16/2020, 11:47 AM

## 2020-05-16 NOTE — Progress Notes (Addendum)
Electrophysiology Rounding Note  Patient Name: Jeffrey [name] Date of Encounter: 05/16/2020  Primary Cardiologist: Glori Bickers, MD Electrophysiologist: Dr. Caryl Comes   Subjective   Remains intubated. Remains on propofol and fentanyl.   Off levophed.  At this time, the patient heavily sedated but arousable   Inpatient Medications    Scheduled Meds: . chlorhexidine gluconate (MEDLINE KIT)  15 mL Mouth Rinse BID  . Chlorhexidine Gluconate Cloth  6 each Topical Daily  . docusate  100 mg Oral BID  . gentamicin irrigation  80 mg Irrigation On Call  . insulin aspart  0-20 Units Subcutaneous Q4H  . mouth rinse  15 mL Mouth Rinse 10 times per day  . mupirocin ointment  1 application Nasal BID  . pantoprazole (PROTONIX) IV  40 mg Intravenous Daily  . polyethylene glycol  17 g Oral Daily   Continuous Infusions: .  ceFAZolin (ANCEF) IV    . fentaNYL infusion INTRAVENOUS 250 mcg/hr (05/16/20 0700)  . norepinephrine (LEVOPHED) Adult infusion Stopped (05/16/20 0544)  . propofol (DIPRIVAN) infusion 40 mcg/kg/min (05/16/20 0700)   PRN Meds: fentaNYL (SUBLIMAZE) injection, fentaNYL (SUBLIMAZE) injection   Vital Signs    Vitals:   05/16/20 0500 05/16/20 0515 05/16/20 0600 05/16/20 0700  BP: 99/65  (!) 89/61 90/61  Pulse: (!) 53 (!) 54 (!) 56 (!) 54  Resp: (!) 24 (!) 24 (!) 0 (!) 0  Temp:      TempSrc:      SpO2: 99% 99% 97% 98%  Weight:      Height:        Intake/Output Summary (Last 24 hours) at 05/16/2020 0740 Last data filed at 05/16/2020 0700 Gross per 24 hour  Intake 2410.55 ml  Output 2130 ml  Net 280.55 ml   Filed Weights   05/15/20 0635 05/16/20 0321  Weight: (!) 154 kg (!) 151.6 kg    Physical Exam    GEN- The patient is intubated and sedated. Responds to voice.  HEENT- + ET tube.  Lungs- +mechanical breathing sounds.  Heart- Regular rate and rhythm, no murmurs, rubs or gallops GI- soft Extremities- no clubbing or cyanosis. No edema Skin- no rash or  lesion Neuro- intubated and sedated.   Labs    CBC Recent Labs    05/15/20 1550 05/16/20 0410  WBC 10.1 8.6  HGB 13.8 12.7*  HCT 41.9 39.5  MCV 85.5 84.2  PLT 288 654   Basic Metabolic Panel Recent Labs    05/15/20 1550 05/16/20 0410  NA 135 138  K 5.9* 4.4  CL 106 108  CO2 18* 20*  GLUCOSE 335* 210*  BUN 32* 31*  CREATININE 1.85* 1.83*  CALCIUM 9.2 8.9   Liver Function Tests Recent Labs    05/15/20 1550 05/16/20 0410  AST 27 19  ALT 28 23  ALKPHOS 44 42  BILITOT 0.5 0.4  PROT 6.9 6.2*  ALBUMIN 3.5 3.0*   No results for input(s): LIPASE, AMYLASE in the last 72 hours. Cardiac Enzymes No results for input(s): CKTOTAL, CKMB, CKMBINDEX, TROPONINI in the last 72 hours.   Telemetry    SB/NSR 50-60s. Very occasionally dipping into upper 40s. (personally reviewed)  Radiology    DG Abd 1 View  Result Date: 05/15/2020 CLINICAL DATA:  Feeding tube EXAM: ABDOMEN - 1 VIEW COMPARISON:  01/19/2019, chest x-ray 05/15/2020 FINDINGS: Only the left upper quadrant of the abdomen is imaged. Pacer generator over the left lower chest. Esophageal tube tip and side-port project over the proximal stomach.  Mild air-filled bowel in the left upper quadrant. IMPRESSION: Esophageal tube tip and side-port project over the proximal stomach. Electronically Signed   By: Donavan Foil M.D.   On: 05/15/2020 19:30   DG Chest Port 1 View  Result Date: 05/15/2020 CLINICAL DATA:  Endotracheal tube placement EXAM: PORTABLE CHEST 1 VIEW COMPARISON:  01/19/2019 FINDINGS: 1009 hours. Endotracheal tube tip is 3.4 cm above the base of the carina low volume film. The cardio pericardial silhouette is enlarged. Bibasilar collapse/consolidative opacity noted with pulmonary vascular congestion. Overlying defibrillator device again noted. Telemetry leads overlie the chest. IMPRESSION: 1. Endotracheal tube tip 3.4 cm above the base of the carina. 2. Lower lung volumes with bibasilar collapse/consolidative  opacity and pulmonary vascular congestion. Electronically Signed   By: Misty Stanley M.D.   On: 05/15/2020 10:19    Patient Profile     Jeffrey [name] is a 44 y.o. male with a past medical history significant for NICM, NYHA class III symptoms, morbid obesity, HTN, and h/o NSVT who had early battery depletion in his [employer] S-ICD. He presented 8/11 for for SubQ ICD gen change and had traumatic intubation resulting in acute hypoxic respiratory failure requiring admission.   Assessment & Plan    1. Acute hypoxic respiratory failure In setting of difficult intubation pre-procedure.  Remains intubated and sedated. Dr. Caryl Comes to discuss with Dr. Elsworth Soho timing of wean, and thus appropriate timing of S-ICD gen change.  2. NICM Pt had premature device failure and will require battery replacement Decision has been made to proceed prior to extubation, but pt still sedated today and not weaned.  Dr. Caryl Comes to further discuss timing with Dr. Elsworth Soho as above.    For questions or updates, please contact [place] Please consult [email or web address] for contact info under Cardiology/STEMI.  Signed, Shirley Friar, PA-C  05/16/2020, 7:40 AM   NICM  Acute respiratory failure  SICD with premature battery depletion  Discussions with pulm, will plan to proceed with device generator replacement today and then plan extubation afterwards  Reviewed plan with wife and mother

## 2020-05-16 NOTE — Progress Notes (Signed)
Pt transported to and from Cath lab via ventilator with no complications noted. RT will continue to monitor.

## 2020-05-16 NOTE — Anesthesia Postprocedure Evaluation (Signed)
Anesthesia Post Note  Patient: Jeffrey [name]  Procedure(s) Performed: [place] (N/A )     Patient location during evaluation: PACU Anesthesia Type: General Level of consciousness: sedated and patient remains intubated per anesthesia plan Pain management: pain level controlled Vital Signs Assessment: post-procedure vital signs reviewed and stable Respiratory status: patient remains intubated per anesthesia plan and patient on ventilator - see flowsheet for VS Cardiovascular status: stable Anesthetic complications: no   There were no known complications for this encounter.  Last Vitals:  Vitals:   05/16/20 2100 05/16/20 2200  BP: (!) 140/98 124/78  Pulse: 78 61  Resp: (!) 25 (!) 21  Temp:    SpO2: 98% 93%    Last Pain:  Vitals:   05/16/20 2007  TempSrc: Oral  PainSc:                  Nolon Nations

## 2020-05-17 ENCOUNTER — Encounter (HOSPITAL_COMMUNITY): Payer: Self-pay | Admitting: Internal Medicine

## 2020-05-17 ENCOUNTER — Other Ambulatory Visit (HOSPITAL_COMMUNITY): Payer: Self-pay | Admitting: Student

## 2020-05-17 ENCOUNTER — Inpatient Hospital Stay (HOSPITAL_COMMUNITY): Payer: Medicaid Other

## 2020-05-17 DIAGNOSIS — J9602 Acute respiratory failure with hypercapnia: Secondary | ICD-10-CM | POA: Diagnosis not present

## 2020-05-17 DIAGNOSIS — J9601 Acute respiratory failure with hypoxia: Secondary | ICD-10-CM | POA: Diagnosis not present

## 2020-05-17 DIAGNOSIS — J9690 Respiratory failure, unspecified, unspecified whether with hypoxia or hypercapnia: Secondary | ICD-10-CM | POA: Diagnosis not present

## 2020-05-17 DIAGNOSIS — J810 Acute pulmonary edema: Secondary | ICD-10-CM | POA: Diagnosis not present

## 2020-05-17 DIAGNOSIS — I428 Other cardiomyopathies: Secondary | ICD-10-CM | POA: Diagnosis not present

## 2020-05-17 LAB — BASIC METABOLIC PANEL
Anion gap: 10 (ref 5–15)
BUN: 25 mg/dL — ABNORMAL HIGH (ref 6–20)
CO2: 22 mmol/L (ref 22–32)
Calcium: 9 mg/dL (ref 8.9–10.3)
Chloride: 107 mmol/L (ref 98–111)
Creatinine, Ser: 1.54 mg/dL — ABNORMAL HIGH (ref 0.61–1.24)
GFR calc Af Amer: >60 mL/min (ref >60)
GFR calc non Af Amer: 54 mL/min — ABNORMAL LOW (ref >60)
Glucose, Bld: 135 mg/dL — ABNORMAL HIGH (ref 70–99)
Potassium: 4.1 mmol/L (ref 3.5–5.1)
Sodium: 139 mmol/L (ref 135–145)

## 2020-05-17 LAB — GLUCOSE, CAPILLARY
Glucose-Capillary: 149 mg/dL — ABNORMAL HIGH (ref 70–99)
Glucose-Capillary: 156 mg/dL — ABNORMAL HIGH (ref 70–99)
Glucose-Capillary: 159 mg/dL — ABNORMAL HIGH (ref 70–99)
Glucose-Capillary: 221 mg/dL — ABNORMAL HIGH (ref 70–99)

## 2020-05-17 LAB — CBC
HCT: 41.2 % (ref 39.0–52.0)
Hemoglobin: 13.4 g/dL (ref 13.0–17.0)
MCH: 27.6 pg (ref 26.0–34.0)
MCHC: 32.5 g/dL (ref 30.0–36.0)
MCV: 84.8 fL (ref 80.0–100.0)
Platelets: 391 10*3/uL (ref 150–400)
RBC: 4.86 MIL/uL (ref 4.22–5.81)
RDW: 15.2 % (ref 11.5–15.5)
WBC: 14.2 10*3/uL — ABNORMAL HIGH (ref 4.0–10.5)
nRBC: 0 % (ref 0.0–0.2)

## 2020-05-17 LAB — PHOSPHORUS: Phosphorus: 3.2 mg/dL (ref 2.5–4.6)

## 2020-05-17 LAB — MAGNESIUM: Magnesium: 1.9 mg/dL (ref 1.7–2.4)

## 2020-05-17 MED ORDER — COLCHICINE 0.6 MG PO CAPS: 1.0000 | ORAL_CAPSULE | Freq: Two times a day (BID) | ORAL | 0 refills | Status: DC | PRN

## 2020-05-17 MED ORDER — ISOSORB DINITRATE-HYDRALAZINE 20-37.5 MG PO TABS
1.0000 | ORAL_TABLET | Freq: Three times a day (TID) | ORAL | Status: DC
  Administered 2020-05-17 (×2): 1 via ORAL
  Filled 2020-05-17 (×2): qty 1

## 2020-05-17 MED ORDER — COLCHICINE 0.6 MG PO TABS
0.6000 mg | ORAL_TABLET | Freq: Once | ORAL | Status: AC
  Administered 2020-05-17: 0.6 mg via ORAL
  Filled 2020-05-17: qty 1

## 2020-05-17 MED ORDER — COLCHICINE 0.6 MG PO TABS
0.6000 mg | ORAL_TABLET | Freq: Every day | ORAL | Status: DC
  Administered 2020-05-17: 0.6 mg via ORAL
  Filled 2020-05-17: qty 1

## 2020-05-17 MED ORDER — AMOXICILLIN-POT CLAVULANATE 875-125 MG PO TABS: 1.0000 | ORAL_TABLET | Freq: Two times a day (BID) | ORAL | 0 refills | Status: DC

## 2020-05-17 MED ORDER — AMOXICILLIN-POT CLAVULANATE 875-125 MG PO TABS
1.0000 | ORAL_TABLET | Freq: Two times a day (BID) | ORAL | Status: DC
  Administered 2020-05-17: 1 via ORAL
  Filled 2020-05-17 (×3): qty 1

## 2020-05-17 MED ORDER — CARVEDILOL 3.125 MG PO TABS
3.1250 mg | ORAL_TABLET | Freq: Two times a day (BID) | ORAL | Status: DC
  Administered 2020-05-17: 3.125 mg via ORAL
  Filled 2020-05-17: qty 1

## 2020-05-17 MED ORDER — APIXABAN 5 MG PO TABS: 5.0000 mg | ORAL_TABLET | Freq: Two times a day (BID) | ORAL | 10 refills | Status: DC

## 2020-05-17 MED ORDER — ACETAMINOPHEN 325 MG PO TABS: 650.0000 mg | ORAL_TABLET | ORAL | Status: DC | PRN

## 2020-05-17 MED ORDER — SACUBITRIL-VALSARTAN 97-103 MG PO TABS
1.0000 | ORAL_TABLET | Freq: Two times a day (BID) | ORAL | Status: DC
  Administered 2020-05-17: 1 via ORAL
  Filled 2020-05-17 (×3): qty 1

## 2020-05-17 MED FILL — Gentamicin Sulfate Inj 40 MG/ML: INTRAMUSCULAR | Qty: 80 | Status: AC

## 2020-05-17 MED FILL — MITIGARE 0.6 MG CAPSULE: 0.6 | 7 days supply | Qty: 14 | Fill #0

## 2020-05-17 MED FILL — AMOX-CLAV 875-125 MG TABLET: 875-125 | 5 days supply | Qty: 9 | Fill #0

## 2020-05-17 NOTE — Progress Notes (Signed)
Pt given discharge instructions with understanding. Pt has no questions at this time. Wife coming to pick pt up. Pt wheeled out via staff.

## 2020-05-17 NOTE — Progress Notes (Signed)
Patient is off BiPAP at this time and is tolerating well. Will let us know if he needs to go back on.

## 2020-05-17 NOTE — Progress Notes (Signed)
  Plan for discharge today if ambulates and voids post foley removal.    Full note pending disposition.   Legrand Como [id number] [place], PA-C  05/17/2020 9:24 AM

## 2020-05-17 NOTE — Progress Notes (Signed)
NAME:  Jeffrey [name], MRN:  [id number], DOB:  [birth year], LOS: 2 ADMISSION DATE:  05/15/2020, CONSULTATION DATE:  05/15/20 REFERRING MD:  Caryl Comes  CHIEF COMPLAINT:  Vent Management   Brief History   Jeffrey [name] is a 44 y.o. male who was admitted 8/11 for elective subcutaneous ICD change out.  In OR, he had difficult airway and once airway was established, sats in 90's despite 100% FiO2 and 10 PEEP; therefore, procedure aborted. Pt left intubated and PCCM asked to assist with vent management.    Past Medical History   HFrEF, nonischemic CKD stage III Morbid obesity Diabetes type 2   Significant Hospital Events   8/11 > admit.  Consults:  None.  Procedures:  ETT 8/11 > 8/12 Art line pending 8/11 > 8/12  Significant Diagnostic Tests:    Micro Data:  COVID 8/9 > neg.  Antimicrobials:  Ancef and Gent pre-op.  Ceftriaxone 8/12 >> 8/13  Interim history/subjective:   Afebrile Has tolerated extubation well Slept okay on BiPAP overnight   Objective:  Blood pressure (!) 143/97, pulse 61, temperature 98.8 F (37.1 C), resp. rate (!) 24, height 5\' 7"  (1.702 m), weight (!) 149.6 kg, SpO2 97 %.    Vent Mode: PSV;CPAP FiO2 (%):  [40 %] 40 % Set Rate:  [24 bmp] 24 bmp Vt Set:  [500 mL] 500 mL PEEP:  [8 cmH20-10 cmH20] 8 cmH20 Pressure Support:  [5 cmH20] 5 cmH20 Plateau Pressure:  [22 cmH20] 22 cmH20   Intake/Output Summary (Last 24 hours) at 05/17/2020 0907 Last data filed at 05/17/2020 0400 Gross per 24 hour  Intake 528.79 ml  Output 4175 ml  Net -3646.21 ml   Filed Weights   05/15/20 0635 05/16/20 0321 05/17/20 0400  Weight: (!) 154 kg (!) 151.6 kg (!) 149.6 kg    Examination: Gen. Pleasant, obese, in no distress, normal affect ENT - no pallor,icterus, no post nasal drip, class 2-3 airway Neck: No JVD, no thyromegaly, no carotid bruits Lungs: no use of accessory muscles, no dullness to percussion, decreased without rales or rhonchi  Cardiovascular: Rhythm  regular, heart sounds  normal, no murmurs or gallops, no peripheral edema Abdomen: soft and non-tender, no hepatosplenomegaly, BS normal. Musculoskeletal: No deformities, no cyanosis or clubbing Neuro:  alert, non focal, no tremors   Chest x-ray 8/13 personally reviewed, improved right lower lobe infiltrate  Labs show normal electrolytes, mild leukocytosis  Assessment & Plan:   Acute hypoxic respiratory failure with DIFFICULT AIRWAY - multiple attempts at intubation per anesthesia resulting in minor trauma and minor blood in airway, resolved after suctioning. -Aspiration pneumonia -Can change antibiotics to Augmentin for 5-day course  Pulmonary edema -good response to Lasix 8/11 -Resolved   Hx OSA on CPAP. - Resume nocturnal CPAP   Hx NICM with sCHF (Echo from March 2019 with EF 25 - 30%), VT s/p ICD placement (due for change out), HTN, HLD. - Per cards / EP. -Discussed with Dr. Caryl Comes, can proceed with ICD change out -Can resume apixaban after procedure   Hx DM. - SSI. - Hold home metformin, insulin.  Hx CKD. -Resume home dose of torsemide   PCCM available as needed  Labs   CBC: Recent Labs  Lab 05/10/20 1359 05/15/20 1027 05/15/20 1550 05/16/20 0410 05/17/20 0037  WBC 6.9  --  10.1 8.6 14.2*  HGB 14.2 13.6 13.8 12.7* 13.4  HCT 42.5 40.0 41.9 39.5 41.2  MCV 85  --  85.5 84.2 84.8  PLT 277  --  288 265 163   Basic Metabolic Panel: Recent Labs  Lab 05/10/20 1359 05/15/20 1027 05/15/20 1550 05/16/20 0410 05/17/20 0037  NA 138 143 135 138 139  K 4.6 4.8 5.9* 4.4 4.1  CL 106  --  106 108 107  CO2 19*  --  18* 20* 22  GLUCOSE 209*  --  335* 210* 135*  BUN 25*  --  32* 31* 25*  CREATININE 1.40*  --  1.85* 1.83* 1.54*  CALCIUM 9.8  --  9.2 8.9 9.0  MG  --   --   --   --  1.9  PHOS  --   --   --   --  3.2   GFR: Estimated Creatinine Clearance: 85.2 mL/min (A) (by C-G formula based on SCr of 1.54 mg/dL (H)). Recent Labs  Lab 05/10/20 1359  05/15/20 1550 05/16/20 0410 05/17/20 0037  WBC 6.9 10.1 8.6 14.2*      Kara Mead MD. FCCP. [facility]  If no response to pager , please call [phone]     05/17/2020, 9:07 AM

## 2020-05-17 NOTE — Discharge Instructions (Signed)
RESUME ELIQUIS Sunday, 8/15 with EVENING DOSE  Implantable Cardiac Device Battery Change, Care After  This sheet gives you information about how to care for yourself after your procedure. Your health care provider may also give you more specific instructions. If you have problems or questions, contact your health care provider. What can I expect after the procedure? After your procedure, it is common to have:  Pain or soreness at the site where the cardiac device was inserted.  Swelling at the site where the cardiac device was inserted.  You should received an information card for your new device in 4-8 weeks. Follow these instructions at home: Incision care   Keep the incision clean and dry. ? Do not take baths, swim, or use a hot tub until after your wound check.  ? Do not shower for at least 7 days, or as directed by your health care provider. ? Pat the area dry with a clean towel. Do not rub the area. This may cause bleeding.  Follow instructions from your health care provider about how to take care of your incision. Make sure you: ? Leave stitches (sutures), skin glue, or adhesive strips in place. These skin closures may need to stay in place for 2 weeks or longer. If adhesive strip edges start to loosen and curl up, you may trim the loose edges. Do not remove adhesive strips completely unless your health care provider tells you to do that.  Check your incision area every day for signs of infection. Check for: ? More redness, swelling, or pain. ? More fluid or blood. ? Warmth. ? Pus or a bad smell. Activity  Do not lift anything that is heavier than 10 lb (4.5 kg) until your health care provider says it is okay to do so.  For the first week, or as long as told by your health care provider: ? Avoid lifting your affected arm higher than your shoulder. ? After 1 week, Be gentle when you move your arms over your head. It is okay to raise your arm to comb your hair. ? Avoid  strenuous exercise.  Ask your health care provider when it is okay to: ? Resume your normal activities. ? Return to work or school. ? Resume sexual activity. Eating and drinking  Eat a heart-healthy diet. This should include plenty of fresh fruits and vegetables, whole grains, low-fat dairy products, and lean protein like chicken and fish.  Limit alcohol intake to no more than 1 drink a day for non-pregnant women and 2 drinks a day for men. One drink equals 12 oz of beer, 5 oz of wine, or 1 oz of hard liquor.  Check ingredients and nutrition facts on packaged foods and beverages. Avoid the following types of food: ? Food that is high in salt (sodium). ? Food that is high in saturated fat, like full-fat dairy or red meat. ? Food that is high in trans fat, like fried food. ? Food and drinks that are high in sugar. Lifestyle  Do not use any products that contain nicotine or tobacco, such as cigarettes and e-cigarettes. If you need help quitting, ask your health care provider.  Take steps to manage and control your weight.  Once cleared, get regular exercise. Aim for 150 minutes of moderate-intensity exercise (such as walking or yoga) or 75 minutes of vigorous exercise (such as running or swimming) each week.  Manage other health problems, such as diabetes or high blood pressure. Ask your health care provider how you  can manage these conditions. General instructions  Do not drive for 24 hours after your procedure if you were given a medicine to help you relax (sedative).  Take over-the-counter and prescription medicines only as told by your health care provider.  Avoid putting pressure on the area where the cardiac device was placed.  If you need an MRI after your cardiac device has been placed, be sure to tell the health care provider who orders the MRI that you have a cardiac device.  Avoid close and prolonged exposure to electrical devices that have strong magnetic fields. These  include: ? Cell phones. Avoid keeping them in a pocket near the cardiac device, and try using the ear opposite the cardiac device. ? MP3 players. ? Household appliances, like microwaves. ? Metal detectors. ? Electric generators. ? High-tension wires.  Keep all follow-up visits as directed by your health care provider. This is important. Contact a health care provider if:  You have pain at the incision site that is not relieved by over-the-counter or prescription medicines.  You have any of these around your incision site or coming from it: ? More redness, swelling, or pain. ? Fluid or blood. ? Warmth to the touch. ? Pus or a bad smell.  You have a fever.  You feel brief, occasional palpitations, light-headedness, or any symptoms that you think might be related to your heart. Get help right away if:  You experience chest pain that is different from the pain at the cardiac device site.  You develop a red streak that extends above or below the incision site.  You experience shortness of breath.  You have palpitations or an irregular heartbeat.  You have light-headedness that does not go away quickly.  You faint or have dizzy spells.  Your pulse suddenly drops or increases rapidly and does not return to normal.  You begin to gain weight and your legs and ankles swell. Summary  After your procedure, it is common to have pain, soreness, and some swelling where the cardiac device was inserted.  Make sure to keep your incision clean and dry. Follow instructions from your health care provider about how to take care of your incision.  Check your incision every day for signs of infection, such as more pain or swelling, pus or a bad smell, warmth, or leaking fluid and blood.  Avoid strenuous exercise and lifting your left arm higher than your shoulder for 2 weeks, or as long as told by your health care provider. This information is not intended to replace advice given to you by  your health care provider. Make sure you discuss any questions you have with your health care provider.

## 2020-05-17 NOTE — Progress Notes (Signed)
Electrophysiology Rounding Note  Patient Name: Jeffrey [name] Date of Encounter: 05/17/2020  Primary Cardiologist: Glori Bickers, MD Electrophysiologist: Dr. Caryl Comes   Subjective   Extubated and feels good, except for some sore throat and pain in his left elbow where he has had gout before     Inpatient Medications    Scheduled Meds: . amoxicillin-clavulanate  1 tablet Oral Q12H  . chlorhexidine gluconate (MEDLINE KIT)  15 mL Mouth Rinse BID  . Chlorhexidine Gluconate Cloth  6 each Topical Daily  . colchicine  0.6 mg Oral Daily  . docusate  100 mg Oral BID  . insulin aspart  0-20 Units Subcutaneous Q4H  . insulin detemir  10 Units Subcutaneous BID  . mupirocin ointment  1 application Nasal BID  . pantoprazole (PROTONIX) IV  40 mg Intravenous Daily  . polyethylene glycol  17 g Oral Daily  . torsemide  40 mg Oral Daily   Continuous Infusions:  PRN Meds: acetaminophen   Vital Signs    Vitals:   05/17/20 0737 05/17/20 0752 05/17/20 0800 05/17/20 0900  BP: (!) 143/97  (!) 143/97 (!) 140/96  Pulse: 69  61 76  Resp: 15  (!) 24 20  Temp:  98.8 F (37.1 C)    TempSrc:      SpO2: 98%  97% 99%  Weight:      Height:        Intake/Output Summary (Last 24 hours) at 05/17/2020 0921 Last data filed at 05/17/2020 0915 Gross per 24 hour  Intake 528.79 ml  Output 4700 ml  Net -4171.21 ml   Filed Weights   05/15/20 0635 05/16/20 0321 05/17/20 0400  Weight: (!) 154 kg (!) 151.6 kg (!) 149.6 kg    Physical Exam    BP (!) 140/96   Pulse 76   Temp 98.8 F (37.1 C)   Resp 20   Ht '5\' 7"'  (1.702 m)   Wt (!) 149.6 kg   SpO2 99%   BMI 51.66 kg/m  Well developed and nourished in no acute distress HENT normal Neck supple with JVP-  flat   Clear Regular rate and rhythm, no murmurs or gallops Abd-soft with active BS No Clubbing cyanosis edema Skin-warm and dry tenderness and warmth over the left lateral elbow A & Oriented  Grossly normal sensory and motor function     Labs    CBC Recent Labs    05/16/20 0410 05/17/20 0037  WBC 8.6 14.2*  HGB 12.7* 13.4  HCT 39.5 41.2  MCV 84.2 84.8  PLT 265 845   Basic Metabolic Panel Recent Labs    05/16/20 0410 05/17/20 0037  NA 138 139  K 4.4 4.1  CL 108 107  CO2 20* 22  GLUCOSE 210* 135*  BUN 31* 25*  CREATININE 1.83* 1.54*  CALCIUM 8.9 9.0  MG  --  1.9  PHOS  --  3.2   Liver Function Tests Recent Labs    05/15/20 1550 05/16/20 0410  AST 27 19  ALT 28 23  ALKPHOS 44 42  BILITOT 0.5 0.4  PROT 6.9 6.2*  ALBUMIN 3.5 3.0*   No results for input(s): LIPASE, AMYLASE in the last 72 hours. Cardiac Enzymes No results for input(s): CKTOTAL, CKMB, CKMBINDEX, TROPONINI in the last 72 hours.   Telemetry    SB/NSR 50-60s. Very occasionally dipping into upper 40s. (personally reviewed)  Radiology    DG Abd 1 View  Result Date: 05/15/2020 CLINICAL DATA:  Feeding tube EXAM: ABDOMEN -  1 VIEW COMPARISON:  01/19/2019, chest x-ray 05/15/2020 FINDINGS: Only the left upper quadrant of the abdomen is imaged. Pacer generator over the left lower chest. Esophageal tube tip and side-port project over the proximal stomach. Mild air-filled bowel in the left upper quadrant. IMPRESSION: Esophageal tube tip and side-port project over the proximal stomach. Electronically Signed   By: Donavan Foil M.D.   On: 05/15/2020 19:30   DG Chest Port 1 View  Result Date: 05/17/2020 CLINICAL DATA:  Respiratory failure EXAM: PORTABLE CHEST 1 VIEW COMPARISON:  05/16/2020 FINDINGS: Cardiac shadow is enlarged but stable. Defibrillator is noted and stable. Gastric catheter is been removed in the interval. Central vascular congestion is noted without significant edema. No focal infiltrate is noted. No bony abnormality is seen. IMPRESSION: Increase in vascular congestion when compared with the prior exam. No focal infiltrate is noted. Electronically Signed   By: [name] Catalina M.D.   On: 05/17/2020 08:18   Portable Chest  xray  Result Date: 05/16/2020 CLINICAL DATA:  Respiratory failure. EXAM: PORTABLE CHEST 1 VIEW COMPARISON:  05/15/2020 FINDINGS: The endotracheal tube is at the mid tracheal level. New NG tube is coursing down the esophagus and into the stomach. Stable cardiac enlargement. Low lung volumes with vascular crowding and progressive basilar atelectasis. Possible small right effusion. IMPRESSION: 1. Stable support apparatus. 2. Low lung volumes with vascular crowding and progressive basilar atelectasis. Electronically Signed   By: Marijo Sanes M.D.   On: 05/16/2020 08:23   DG Chest Port 1 View  Result Date: 05/15/2020 CLINICAL DATA:  Endotracheal tube placement EXAM: PORTABLE CHEST 1 VIEW COMPARISON:  01/19/2019 FINDINGS: 1009 hours. Endotracheal tube tip is 3.4 cm above the base of the carina low volume film. The cardio pericardial silhouette is enlarged. Bibasilar collapse/consolidative opacity noted with pulmonary vascular congestion. Overlying defibrillator device again noted. Telemetry leads overlie the chest. IMPRESSION: 1. Endotracheal tube tip 3.4 cm above the base of the carina. 2. Lower lung volumes with bibasilar collapse/consolidative opacity and pulmonary vascular congestion. Electronically Signed   By: Misty Stanley M.D.   On: 05/15/2020 10:19    Patient Profile     Jeffrey [name] is a 44 y.o. male with a past medical history significant for NICM, NYHA class III symptoms, morbid obesity, HTN, and h/o NSVT who had early battery depletion in his [employer] S-ICD. He presented 8/11 for for SubQ ICD gen change and had traumatic intubation resulting in acute hypoxic respiratory failure requiring admission.   Assessment & Plan    1. Acute hypoxic respiratory failure/aspiration pneumonia I  2. NICm  SICD with premature battery depletion  Gout-acute left elbow  Foley in place  Patient is doing well.  We will ambulate.  Remove Foley.  Anticipate discharge later today.  Treated with  colchicine.  Continue antibiotics per pulmonary x5 days-Augmentin

## 2020-05-17 NOTE — Discharge Summary (Signed)
ELECTROPHYSIOLOGY PROCEDURE DISCHARGE SUMMARY    Patient ID: Jeffrey [name],  MRN: [id number], DOB/AGE: 45-Aug-1977 45 y.o.  Admit date: 05/15/2020 Discharge date: 05/17/2020  Primary Care Physician: [name], FNP  Primary Cardiologist: Glori Bickers, MD Primary EP: Dr. 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 Traumatic intubation 1.  Generator change-out of a Environmental education officer on 05/16/2020 by Dr. Caryl Comes with assistance from Dr. Quentin Ore. Pt received a Equities trader.There were no immediate post procedure complications 2.  CXR on 05/17/20 demonstrated increased vascular congestion. ICD stable.   Brief HPI:  Jeffrey [name] is a 44 y.o. male seen by Electrophysiology in the outpatient setting for consideration of generator change out due to ERI. Past medical history includes above. Risk, benefits, and alternatives to implantable cardiac device system revision were reviewed with the patient who wished to proceed.   Hospital Course:  The patient was admitted for planned underwent generator change of a [employer] S-ICD with details as outlined above. Pt admitted 8/11, but had traumatic intubation resulting in aspiration and requiring ventilator support and short term pressor support. On 8/12 pt stable on vent, off pressors. Pt generator change completed while still intubated due to difficult intubation on presentation. Covered with ABX with  concern for aspiration PNA with plan for Augmenton for 5 day course on discharge. he was monitored on telemetry overnight which demonstrated SB/NSR 50-60s with occasional dipping into upper 40s. Left side was without hematoma or ecchymosis.  The device was interrogated and found to be functioning normally. Pt did complain of left elbow pain consistent with previous gout flares. Colchicine started. CXR was obtained and demonstrated no pneumothorax status post device implantation (Note this was S-ICD).  Wound care, arm mobility, and restrictions were reviewed with the patient.  The patient was examined and considered stable for discharge to home with close follow up as below.   Physical Exam: Vitals:   05/17/20 1100 05/17/20 1140 05/17/20 1200 05/17/20 1300  BP: (!) 153/78  116/81 (!) 135/94  Pulse:   77 95  Resp: 18  (!) 21 20  Temp:  (!) 97.2 F (36.2 C)    TempSrc:      SpO2: 96%  93% 97%  Weight:      Height:        GEN- The patient is well appearing, alert and oriented x 3 today.   HEENT: normocephalic, atraumatic; sclera clear, conjunctiva pink; hearing intact; oropharynx clear; neck supple, no JVP Lymph- no cervical lymphadenopathy Lungs- Clear to ausculation bilaterally, normal work of breathing.  No wheezes, rales, rhonchi Heart- Regular rate and rhythm, no murmurs, rubs or gallops, PMI not laterally displaced GI- soft, non-tender, non-distended, bowel sounds present, no hepatosplenomegaly Extremities- no clubbing, cyanosis, or edema; DP/PT/radial pulses 2+ bilaterally MS- no significant deformity or atrophy Skin- warm and dry, no rash or lesion Psych- euthymic mood, full affect Neuro- strength and sensation are intact    Labs:  Lab Results  Component Value Date   WBC 14.2 (H) 05/17/2020   HGB 13.4 05/17/2020   HCT 41.2 05/17/2020   MCV 84.8 05/17/2020   PLT 391 05/17/2020    Recent Labs  Lab 05/16/20 0410 05/16/20 0410 05/17/20 0037  NA 138   < > 139  K 4.4   < >  4.1  CL 108   < > 107  CO2 20*   < > 22  BUN 31*   < > 25*  CREATININE 1.83*   < > 1.54*  CALCIUM 8.9   < > 9.0  PROT 6.2*  --   --   BILITOT 0.4  --   --   ALKPHOS 42  --   --   ALT 23  --   --   AST 19  --   --   GLUCOSE 210*   < > 135*   < > = values in this interval not displayed.     Discharge Medications:  Allergies as of 05/17/2020      Reactions   Aspirin Shortness Of Breath, Itching, Rash    Burning sensation (Patient reports he tolerates other NSAIDS)    Bee Venom Hives, Swelling   Lisinopril Cough   Tomato Rash      Medication List    TAKE these medications   Accu-Chek FastClix Lancets Misc USE AS DIRECTED FOUR TIMES DAILY   Accu-Chek Guide test strip Generic drug: glucose blood USE AS DIRECTED FOUR TIMES DAILY   acetaminophen 325 MG tablet Commonly known as: TYLENOL Take 2 tablets (650 mg total) by mouth every 4 (four) hours as needed for mild pain, moderate pain, fever or headache.   allopurinol 100 MG tablet Commonly known as: ZYLOPRIM Take 1 tablet (100 mg total) by mouth daily.   amiodarone 200 MG tablet Commonly known as: PACERONE Take 1 tablet (200 mg total) by mouth daily.   amoxicillin-clavulanate 875-125 MG tablet Commonly known as: AUGMENTIN Take 1 tablet by mouth 2 (two) times daily. For 5 days total. No refills.   apixaban 5 MG Tabs tablet Commonly known as: ELIQUIS Take 1 tablet (5 mg total) by mouth 2 (two) times daily. Resume with EVENING dose on Sunday Start taking on: May 19, 2020 What changed:   additional instructions  These instructions start on May 19, 2020. If you are unsure what to do until then, ask your doctor or other care provider.   BiDil 20-37.5 MG tablet Generic drug: isosorbide-hydrALAZINE Take 1 tablet by mouth 3 (three) times daily. Needs appt   blood glucose meter kit and supplies Dispense based on patient and insurance preference. Use up to four times daily as directed. (FOR ICD-10 E10.9, E11.9).     busPIRone 10 MG tablet Commonly known as: BUSPAR Take 1 tablet (10 mg total) by mouth 2 (two) times daily.   carvedilol 3.125 MG tablet Commonly known as: COREG Take 1 tablet (3.125 mg total) by mouth 2 (two) times daily.   cetirizine 10 MG tablet Commonly known as: ZYRTEC Take 1 tablet (10 mg total) by mouth daily.   Colchicine 0.6 MG Caps Commonly known as: Mitigare Take 1 tablet by mouth 2 (two) times daily as needed (gout).   fluticasone 50 MCG/ACT nasal spray Commonly known as: FLONASE Place 2 sprays into both nostrils daily. What changed:   when to take this  reasons to take this   FreeStyle Libre 14 Day Reader Kerrin Mo 1 each by Does not apply route 3 (three)  times daily between meals as needed.   gabapentin 300 MG capsule Commonly known as: NEURONTIN Take 1 capsule (300 mg total) by mouth 3 (three) times daily.   glipiZIDE 10 MG tablet Commonly known as: GLUCOTROL Take 1 tablet (10 mg total) by mouth 2 (two) times daily before a meal.   Guardian Sensor 3 Misc 1 Device by Does not apply route 3 (three) times daily between meals as needed.   insulin NPH-regular Human (70-30) 100 UNIT/ML injection Inject 38 Units into the skin 2 (two) times daily with a meal.   Insulin Syringe-Needle U-100 31G X 5/16" 0.5 ML Misc Commonly known as: TRUEplus Insulin Syringe Use to inject insulin twice daily.   Magnesium Oxide 200 MG Tabs Take 100 mg by mouth daily.   meclizine 25 MG tablet Commonly known as: ANTIVERT Take 1 tablet (25 mg total) by mouth 3 (three) times daily.   metFORMIN 1000 MG tablet Commonly known as: GLUCOPHAGE Take 1 tablet (1,000 mg total) by mouth 2 (two) times daily with a meal.   NovoLOG FlexPen 100 UNIT/ML FlexPen Generic drug: insulin aspart Inject 0-15 Units into the skin 3 (three) times daily with meals. CBG 121-150: 2u; CBG 151-200: 3u; CBG 201-250: 5u; CBG 251-300: 8u; CBG 301-350: 11u; CBG 351-400:15 u; CBG>400: 15u   sacubitril-valsartan  97-103 MG Commonly known as: ENTRESTO Take 1 tablet by mouth 2 (two) times daily.   spironolactone 25 MG tablet Commonly known as: ALDACTONE Take 0.5 tablets (12.5 mg total) by mouth daily.   torsemide 20 MG tablet Commonly known as: DEMADEX Take 2 tablets (40 mg total) by mouth daily.   traZODone 100 MG tablet Commonly known as: DESYREL Take 1 tablet (100 mg total) by mouth at bedtime. For sleep What changed:   when to take this  reasons to take this   TRUEplus 5-Bevel Pen Needles 31G X 6 MM Misc Generic drug: Insulin Pen Needle 1 Syringe by Does not apply route as needed.   Vitamin D (Ergocalciferol) 1.25 MG (50000 UNIT) Caps capsule Commonly known as: DRISDOL Take 1 capsule (50,000 Units total) by mouth once a week.       Disposition: Pt is being discharged home today in good condition.   Follow-up Information    [name], MD Follow up on 08/27/2020.   Specialty: Cardiology Why: at 3 pm for 3 month post gen change follow up. Contact information: [address] [place] [address] [phone]        [facility] Follow up on 05/28/2020.   Why: at 10 am for post gen change wound check. Contact information: [place] [id number] [phone]              Duration of Discharge Encounter: Greater than 30 minutes including physician time.  Jacalyn Lefevre, PA-C  05/17/2020 1:11 PM

## 2020-05-17 NOTE — Plan of Care (Signed)
  Problem: Clinical Measurements: Goal: Ability to maintain clinical measurements within normal limits will improve Outcome: Completed/Met Goal: Will remain free from infection Outcome: Completed/Met Goal: Diagnostic test results will improve Outcome: Completed/Met Goal: Respiratory complications will improve Outcome: Completed/Met Goal: Cardiovascular complication will be avoided Outcome: Completed/Met   Problem: Health Behavior/Discharge Planning: Goal: Ability to manage health-related needs will improve Outcome: Completed/Met   Problem: Education: Goal: Knowledge of General Education information will improve Description: Including pain rating scale, medication(s)/side effects and non-pharmacologic comfort measures Outcome: Completed/Met   

## 2020-05-20 ENCOUNTER — Telehealth: Payer: Self-pay | Admitting: Internal Medicine

## 2020-05-20 ENCOUNTER — Telehealth: Payer: Self-pay | Admitting: *Deleted

## 2020-05-20 NOTE — Telephone Encounter (Signed)
Patient noted to have ICD change out 05/16/20. Patient reports of lightheaded that started yesterday. Reports today he experienced dizziness constantly, worsens when changing positions. Patient denies any chest pain, shortness of breath or other complaints. Patient reports he has experienced this in the past but has concern considering his device change out.   Manual transmission received 05/20/20 @ 16:01. No episodes noted, VS 75 bpm.      Reached out to [place], Therapist, sports, discussed. States she will speak to Dr. Caryl Comes and get back about a plan.  Patient called and updated. Advised patient he will receive a call back regarding plan. Patient agreeable.

## 2020-05-20 NOTE — Telephone Encounter (Signed)
STAT if patient feels like he/she is going to faint   1) Are you dizzy now? yes  2) Do you feel faint or have you passed out?   3) Do you have any other symptoms? Blood on the bandage from his ICD change  4) Have you checked your HR and BP (record if available)? No   Patient had an ICD change out on 05/16/20. The patient was told to contact us if he felt dizzy or lightheaded. He feels lie the room is spinning like he just got off a merry-go-round.

## 2020-05-20 NOTE — Telephone Encounter (Signed)
Per Rosann Auerbach, RN, Dr. Caryl Comes does not feel this is related to patients heart, recommends reaching out to PCP.  Patient called and updated. Patient agreeable.  Patient advised if he has any further questions or concerns to please call DC back at [phone]. Verbalizes understanding.

## 2020-05-20 NOTE — Telephone Encounter (Signed)
Contacted pt to complete transition of care assessment:  Transition Care Management Follow-up Telephone Call  . Medicaid Managed Care Transition Call Status:MM [facility] Call Made  . Date of discharge and from where: [facility], 05/17/20  . How have you been since you were released from the hospital?" better"  . Any questions or concerns? No  Items Reviewed: [name] Did the pt receive and understand the discharge instructions provided? Yes  . Medications obtained and verified? Yes  . Any new allergies since your discharge? No  . Dietary orders reviewed? Yes . Do you have support at home?Yes, wife  Functional Questionnaire: (I = Independent and D = Dependent)  ADLs: Independent Bathing/Dressing:Independent Meal Prep: Independent Eating: Independent Maintaining continence: Independent Transferring/Ambulation: Independent Managing Meds: Independent   Follow up appointments reviewed:  PCP Hospital f/u appt confirmed? No  Pt to call Dr Kathe Becton on 05/20/20 to schedule follow up apptt  Specialist Hospital f/u appt confirmed? Yes Scheduled to see Dr Sung Amabile on 05/29/20 @ 1440   Are transportation arrangements needed? No   If their condition worsens, is the pt aware to call PCP or go to the EmergencyDept.?Yes  Was the patient provided with contact information for the PCP's office or ED? Yes  Was to pt encouraged to call back with questions or concerns? Yes  Lenor Coffin, RN, BSN, [place] Patient [place] [phone]

## 2020-05-21 MED FILL — MECLIZINE 25 MG TABLET: 25 | 10 days supply | Qty: 30 | Fill #6

## 2020-05-21 MED FILL — ENTRESTO 97 MG-103 MG TAB: 97-103 | 30 days supply | Qty: 60 | Fill #3

## 2020-05-28 ENCOUNTER — Other Ambulatory Visit: Payer: Self-pay

## 2020-05-28 ENCOUNTER — Ambulatory Visit (INDEPENDENT_AMBULATORY_CARE_PROVIDER_SITE_OTHER): Payer: Medicaid Other | Admitting: Emergency Medicine

## 2020-05-28 DIAGNOSIS — I639 Cerebral infarction, unspecified: Secondary | ICD-10-CM

## 2020-05-28 DIAGNOSIS — I472 Ventricular tachycardia, unspecified: Secondary | ICD-10-CM

## 2020-05-28 DIAGNOSIS — Z95810 Presence of automatic (implantable) cardiac defibrillator: Secondary | ICD-10-CM

## 2020-05-28 LAB — CUP PACEART INCLINIC DEVICE CHECK
Date Time Interrogation Session: 20210824105511
Implantable Lead Implant Date: 20160812
Implantable Lead Location: 753862
Implantable Lead Model: 3401
Implantable Pulse Generator Implant Date: 20210812
Pulse Gen Serial Number: 139514

## 2020-05-28 NOTE — Patient Instructions (Signed)
Wash incision site with clean cloth and ant-bacterial soap daily. Call the device clinic if you have any drainage, swelling redness at incision site. Call the device clinic if you have a fever or chills.

## 2020-05-28 NOTE — Progress Notes (Addendum)
.  Subcutaneous ICD wound check in clinic after gen. change..Outer dressing and steri-strips removed. Incision edges approximated, no drainage edema or other signs of infection 0 untreated episodes; 0 treated episodes; 0 shocks delivered. Electrode impedance status okay. No programming changes. Remaining longevity to ERI 99%. Follow up with Dr Caryl Comes 08/27/20.Enrolled in remote follow and next remote 08/21/20.

## 2020-05-29 ENCOUNTER — Other Ambulatory Visit (HOSPITAL_COMMUNITY): Payer: Self-pay | Admitting: Internal Medicine

## 2020-05-29 ENCOUNTER — Ambulatory Visit (HOSPITAL_COMMUNITY)
Admission: RE | Admit: 2020-05-29 | Discharge: 2020-05-29 | Disposition: A | Discharge status: Home / Self Care | Payer: Medicaid Other | Source: Ambulatory Visit | Attending: Internal Medicine | Admitting: Internal Medicine

## 2020-05-29 ENCOUNTER — Encounter (HOSPITAL_COMMUNITY): Payer: Self-pay | Admitting: Internal Medicine

## 2020-05-29 VITALS — BP 130/84 | HR 92 | Wt 328.2 lb

## 2020-05-29 DIAGNOSIS — I10 Essential (primary) hypertension: Secondary | ICD-10-CM

## 2020-05-29 DIAGNOSIS — Z7901 Long term (current) use of anticoagulants: Secondary | ICD-10-CM | POA: Insufficient documentation

## 2020-05-29 DIAGNOSIS — N183 Chronic kidney disease, stage 3 unspecified: Secondary | ICD-10-CM | POA: Diagnosis not present

## 2020-05-29 DIAGNOSIS — I5022 Chronic systolic (congestive) heart failure: Secondary | ICD-10-CM

## 2020-05-29 DIAGNOSIS — I130 Hypertensive heart and chronic kidney disease with heart failure and stage 1 through stage 4 chronic kidney disease, or unspecified chronic kidney disease: Secondary | ICD-10-CM | POA: Diagnosis not present

## 2020-05-29 DIAGNOSIS — F419 Anxiety disorder, unspecified: Secondary | ICD-10-CM | POA: Diagnosis not present

## 2020-05-29 DIAGNOSIS — Z79899 Other long term (current) drug therapy: Secondary | ICD-10-CM | POA: Insufficient documentation

## 2020-05-29 DIAGNOSIS — E1122 Type 2 diabetes mellitus with diabetic chronic kidney disease: Secondary | ICD-10-CM | POA: Diagnosis not present

## 2020-05-29 DIAGNOSIS — Z6841 Body Mass Index (BMI) 40.0 and over, adult: Secondary | ICD-10-CM | POA: Insufficient documentation

## 2020-05-29 DIAGNOSIS — I428 Other cardiomyopathies: Secondary | ICD-10-CM | POA: Insufficient documentation

## 2020-05-29 DIAGNOSIS — I5023 Acute on chronic systolic (congestive) heart failure: Secondary | ICD-10-CM | POA: Diagnosis present

## 2020-05-29 DIAGNOSIS — Z87891 Personal history of nicotine dependence: Secondary | ICD-10-CM | POA: Insufficient documentation

## 2020-05-29 DIAGNOSIS — Z95810 Presence of automatic (implantable) cardiac defibrillator: Secondary | ICD-10-CM | POA: Diagnosis not present

## 2020-05-29 DIAGNOSIS — E7800 Pure hypercholesterolemia, unspecified: Secondary | ICD-10-CM | POA: Insufficient documentation

## 2020-05-29 DIAGNOSIS — Z794 Long term (current) use of insulin: Secondary | ICD-10-CM | POA: Insufficient documentation

## 2020-05-29 DIAGNOSIS — I472 Ventricular tachycardia, unspecified: Secondary | ICD-10-CM

## 2020-05-29 DIAGNOSIS — G4733 Obstructive sleep apnea (adult) (pediatric): Secondary | ICD-10-CM | POA: Insufficient documentation

## 2020-05-29 DIAGNOSIS — M109 Gout, unspecified: Secondary | ICD-10-CM | POA: Diagnosis not present

## 2020-05-29 DIAGNOSIS — I480 Paroxysmal atrial fibrillation: Secondary | ICD-10-CM | POA: Diagnosis not present

## 2020-05-29 MED ORDER — AMIODARONE HCL 100 MG PO TABS: 200.0000 mg | ORAL_TABLET | Freq: Every day | ORAL | 6 refills | Status: DC

## 2020-05-29 MED ORDER — CARVEDILOL 6.25 MG PO TABS: 3.1250 mg | ORAL_TABLET | Freq: Two times a day (BID) | ORAL | 6 refills | Status: DC

## 2020-05-29 MED FILL — AMIODARONE HCL 200 MG TAB: 200 | 15 days supply | Qty: 15 | Fill #0

## 2020-05-29 MED FILL — CARVEDILOL 6.25 MG TABLET: 6.25 | 30 days supply | Qty: 30 | Fill #0

## 2020-05-29 NOTE — Progress Notes (Signed)
Patient ID: Jeffrey [name], male   DOB: Dec 16, 1975, 44 y.o.   MRN: [id number]      Advanced Heart Failure Clinic Note   Patient Name: Jeffrey [name] Date of Encounter: 05/29/2020  Primary Care Provider:  [name], FNP Primary Cardiologist:  Lizbeth Bark, MD  Primary HF Cardiologist: Oralee Rapaport  History: Jeffrey [name] is a 44 y.o. male with a history of  poorly controlled hypertension, R renal cell carcinoma s/p nephrectomy, DM2, OSA, gout, morbid obesity and systolic HF due tononischemic myopathy.    Admitted to [facility] in 3/16 secondary to atypical chest pain, dyspnea, and mild troponin elevation. Echo during hospitalization shows "severely reduced LV function" - EF not quantified due to poor windows.  Admitted 05/14/15 with CP at rest. [facility] showed normal coronaries and moderately elevated filling pressures with moderately depressed cardiac output. [place] subcutaneous ICD was implanted  Admitted 9/4 - 12/03/52 with A/C systolic CHF in the setting of new afib. Started on IV lasix and amiodarone. Pt diuresed and converted to NSR without requiring DCCV. Started on Eliquis for [facility]. Continued on po amio on discharge with wean over several weeks.  Admitted 3/27 - 01/01/18 with ICD shock. Electrolytes stable. Echo 12/31/17 with LVEF 25-30%.   Pt admitted 05/15/20 for S-ICD generator change. Course was complicated with acute hypoxic/hypercapnic respiratory failure due to possible flash pulmonary edema. Had traumatic intubation resulting in aspiration and requiring ventilator support and short term pressor support. On 8/12 pt stable on vent, off pressors. Pt generator change completed. Covered with ABX with concern for aspiration PNA with plan for Augmenton for 5 day course on discharge.  Today he returns for post-hospital follow up. Feeling good. Walking 2.5 miles per day. Denies SOB, edema, orthopnea or PND.   Echo 12/31/17 LVEF 25-30%, Moderate MR, mild RV dilation, Severe RAE, Mod TR, Pa  peak pressure 67 mm Hg.  ECHO 01/16/2016 EF 20-25%  Grade III DD Echo 05/07/15.  EF 20-25%, grade II diastolic dysfunction, diffuse hypokinesis, moderate LV dilation, mild LVH, normal RV size with mildly decreased systolic function.   Echo in 2012 with EF 20-25%.  In 1/13 had nuclear study EF 45-50% suggestive of NICM.   [facility] 05/17/15 RA = 7 RV = 42/3/7 PA = 43/30 (39) PCW = 26 Fick cardiac output/index = 4.9/2.1 PVR = 2.7 WU SVR = 1651 FA sat = 95% PA sat = 65%, 65% Ao = 122/97 (109) LV = 110/31/33 1) Normal coronaries 2) Moderately elevated filling pressures with moderately depressed cardiac output  Review of systems complete and found to be negative unless listed in HPI.    Past Medical History:  Diagnosis Date  . Anxiety   . Aspirin allergy   . Childhood asthma   . Chronic systolic CHF (congestive heart failure) ([place])    a. EF 20-25% in 2012. b. EF 45-50% in 10/2011 with nonischemic nuc - presumed NICM. c. 12/2014 Echo: Sev depressed LV fxn, sev dil LV, mild LVH, mild MR, sev dil LA, mildly reduced RV fxn.  . CKD (chronic kidney disease) stage 2, GFR 60-89 ml/min   . H/O vasectomy 12/2019  . High cholesterol   . Hypertension   . Morbid obesity ([place])   . Nephrolithiasis   . OSA on CPAP   . Presumed NICM    a. 04/2014 Myoview: EF 26%, glob HK, sev glob HK, ? prior infarct;  b. Never cathed 2/2 CKD.  [name] Renal cell carcinoma ([place])    a.  s/p Rt robotic assisted partial converted to radical nephrectomy on 01/2013.  . Troponin level elevated    a. 04/2014, 12/2014: felt due to CHF.  . Type II diabetes mellitus ([place])   . Ventricular tachycardia ([place])    a. appropriate ICD therapy 12/2017   Past Surgical History:  Procedure Laterality Date  . APPENDECTOMY  07/2004  . CARDIAC CATHETERIZATION N/A 05/17/2015   Procedure: Right/Left Heart Cath and Coronary Angiography;  Surgeon: Jolaine Artist, MD;  Location: [place] [facility];  Service: Cardiovascular;  Laterality: N/A;  . EP  IMPLANTABLE DEVICE N/A 05/17/2015   Procedure: SubQ ICD Implant;  Surgeon: Will Meredith Leeds, MD;  Location: [place] [facility];  Service: Cardiovascular;  Laterality: N/A;  . ROBOTIC ASSISTED LAPAROSCOPIC LYSIS OF ADHESION  01/13/2013   Procedure: ROBOTIC ASSISTED LAPAROSCOPIC LYSIS OF ADHESION EXTENSIVE;  Surgeon: Alexis Frock, MD;  [facility];  Service: Urology;;  . ROBOTIC ASSITED PARTIAL NEPHRECTOMY Right 01/13/2013   Procedure: ROBOTIC ASSITED PARTIAL NEPHRECTOMY CONVERTED TO ROBOTIC ASSISTED RIGHT RADICAL NEPHRECTOMY;  Surgeon: Alexis Frock, MD;  [facility]: [facility];  Service: Urology;  Laterality: Right;  . SUBQ ICD CHANGEOUT N/A 05/16/2020   Procedure: SUBQ ICD CHANGEOUT;  Surgeon: [name], MD;  Location: [place] [facility];  Service: Cardiovascular;  Laterality: N/A;  . SUBQ ICD CHANGEOUT N/A 05/15/2020   Procedure: KWIO ICD CHANGEOUT;  Surgeon: [name], MD;  Location: [place] [facility];  Service: Cardiovascular;  Laterality: N/A;  . VASECTOMY      Allergies  Allergies  Allergen Reactions  . Aspirin Shortness Of Breath, Itching and Rash     Burning sensation (Patient reports he tolerates other NSAIDS)   . Bee Venom Hives and Swelling  . Lisinopril Cough  . Tomato Rash     Home Medications Current Outpatient Medications  Medication Sig Dispense Refill  . Accu-Chek FastClix Lancets MISC USE AS DIRECTED FOUR TIMES DAILY 102 each 11  . acetaminophen (TYLENOL) 325 MG tablet Take 2 tablets (650 mg total) by mouth every 4 (four) hours as needed for mild pain, moderate pain, fever or headache.    . allopurinol (ZYLOPRIM) 100 MG tablet Take 1 tablet (100 mg total) by mouth daily. 30 tablet 6  . amiodarone (PACERONE) 200 MG tablet Take 1 tablet (200 mg total) by mouth daily. 30 tablet 6  . amoxicillin-clavulanate (AUGMENTIN) 875-125 MG tablet Take 1 tablet by mouth 2 (two) times daily. For 5 days total. No refills. 9 tablet 0  . apixaban (ELIQUIS) 5 MG TABS  tablet Take 1 tablet (5 mg total) by mouth 2 (two) times daily. Resume with EVENING dose on Sunday 60 tablet 10  . blood glucose meter kit and supplies Dispense based on patient and insurance preference. Use up to four times daily as directed. (FOR ICD-10 E10.9, E11.9). 1 each 0  . busPIRone (BUSPAR) 10 MG tablet Take 1 tablet (10 mg total) by mouth 2 (two) times daily. 60 tablet 3  . carvedilol (COREG) 3.125 MG tablet Take 1 tablet (3.125 mg total) by mouth 2 (two) times daily. 60 tablet 6  . cetirizine (ZYRTEC) 10 MG tablet Take 1 tablet (10 mg total) by mouth daily. 30 tablet 6  . Colchicine (MITIGARE) 0.6 MG CAPS Take 1 tablet by mouth 2 (two) times daily as needed (gout). 14 capsule 0  . Continuous Blood Gluc Receiver (FREESTYLE LIBRE 14 DAY READER) DEVI 1 each by Does not apply route 3 (three) times daily between  meals as needed. 1 each 1  . Continuous Blood Gluc Sensor (GUARDIAN SENSOR 3) MISC 1 Device by Does not apply route 3 (three) times daily between meals as needed. 5 each 6  . fluticasone (FLONASE) 50 MCG/ACT nasal spray Place 2 sprays into both nostrils daily. 16 g 6  . gabapentin (NEURONTIN) 300 MG capsule Take 1 capsule (300 mg total) by mouth 3 (three) times daily. 90 capsule 6  . glipiZIDE (GLUCOTROL) 10 MG tablet Take 1 tablet (10 mg total) by mouth 2 (two) times daily before a meal. 60 tablet 6  . glucose blood (ACCU-CHEK GUIDE) test strip USE AS DIRECTED FOUR TIMES DAILY 100 strip 11  . insulin aspart (NOVOLOG FLEXPEN) 100 UNIT/ML FlexPen Inject 0-15 Units into the skin 3 (three) times daily with meals. CBG 121-150: 2u; CBG 151-200: 3u; CBG 201-250: 5u; CBG 251-300: 8u; CBG 301-350: 11u; CBG 351-400:15 u; CBG>400: 15u 15 mL 12  . insulin NPH-regular Human (70-30) 100 UNIT/ML injection Inject 38 Units into the skin 2 (two) times daily with a meal. 60 mL 12  . Insulin Pen Needle (TRUEPLUS 5-BEVEL PEN NEEDLES) 31G X 6 MM MISC 1 Syringe by Does not apply route as needed. 100 each 12   . Insulin Syringe-Needle U-100 (TRUEPLUS INSULIN SYRINGE) 31G X 5/16" 0.5 ML MISC Use to inject insulin twice daily. 100 each 11  . isosorbide-hydrALAZINE (BIDIL) 20-37.5 MG tablet Take 1 tablet by mouth 3 (three) times daily. Needs appt 90 tablet 6  . Magnesium Oxide 200 MG TABS Take 100 mg by mouth daily.    . meclizine (ANTIVERT) 25 MG tablet Take 1 tablet (25 mg total) by mouth 3 (three) times daily. 30 tablet 6  . metFORMIN (GLUCOPHAGE) 1000 MG tablet Take 1 tablet (1,000 mg total) by mouth 2 (two) times daily with a meal. 60 tablet 6  . sacubitril-valsartan (ENTRESTO) 97-103 MG Take 1 tablet by mouth 2 (two) times daily. 60 tablet 6  . spironolactone (ALDACTONE) 25 MG tablet Take 0.5 tablets (12.5 mg total) by mouth daily. 45 tablet 6  . torsemide (DEMADEX) 20 MG tablet Take 2 tablets (40 mg total) by mouth daily. 60 tablet 6  . traZODone (DESYREL) 100 MG tablet Take 1 tablet (100 mg total) by mouth at bedtime. For sleep 30 tablet 3  . Vitamin D, Ergocalciferol, (DRISDOL) 1.25 MG (50000 UT) CAPS capsule Take 1 capsule (50,000 Units total) by mouth once a week. 5 capsule 6   No current facility-administered medications for this encounter.   Vitals:   05/29/20 1503  BP: 130/84  Pulse: 92  SpO2: 97%  Weight: (!) 148.9 kg (328 lb 4 oz)   Wt Readings from Last 3 Encounters:  05/29/20 (!) 148.9 kg (328 lb 4 oz)  05/17/20 (!) 149.6 kg (329 lb 12.9 oz)  04/25/20 (!) 154.2 kg (340 lb)   Physical Exam General:  Well appearing. No resp difficulty HEENT: normal Neck: supple. no JVD. Carotids 2+ bilat; no bruits. No lymphadenopathy or thryomegaly appreciated. Cor: PMI nondisplaced. Regular rate & rhythm. No rubs, gallops or murmurs. Lungs: clear Abdomen: obesesoft, nontender, nondistended. No hepatosplenomegaly. No bruits or masses. Good bowel sounds. Extremities: no cyanosis, clubbing, rash, edema Neuro: alert & orientedx3, cranial nerves grossly intact. moves all 4 extremities w/o  difficulty. Affect pleasant   Assessment & Plan 1.  Chronic systolic congestive heart failure: s/p [employer] subcutaneous ICD implant 05/17/15 (gen change 8/21 - c/b traumatic intubation).  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  -  Echo 12/31/17 LVEF 25-30%, Moderate MR, mild RV dilation, Severe RAE, Mod TR, Pa peak pressure 67 mm Hg.  NYHA II. Volume status stable despite weight gain.  - Continue torsemide 40 mg daily  - Continue Entresto 97/103 mg BID. Watch closely with solitary kidney.  - Continue bidil 1 tab TID - Increase carvedilol 3.125 bid.  - Continue spiro 12.5 daily  - Due for repeat echo - Refer to PharmD clinic to help titrate GDMT. I have asked him to speak with PCP re addition of Farxiga 2. Paroxysmal Afib - New diagnosis 06/2017.  - Maintaining NSR - Continue Eliquis 5 mg BID.  - Decrease amio to 100 daily. Check TFTs and LFTs today.  - Will need yearly eye exams. Regular on exam today.  3. VT with ICD shock 12/29/17 - Decrease amio to 100 mg daily. No syncope or palpitations. 4. Hypertension:  - Stable.  5. CKD stage III -creatinine 1.5-1.7.  6. OSA:  - Back on CPAP.  7. Tobacco use: - Former smoker. No change.  8. Gout:  - Continue allopurinol 200 daily for prophylaxis. No change.  9. DM2:  - Stable. Per PCP.  - I have asked him to speak with PCP re addition of Farxiga 10.Renal Cell Carcinoma - S/P nephrectomy 2014.   Follow closely in setting of solitary kidney.  - Follows at [facility] and has follow up next month.  11. Morbid Obesity - Body mass index is 51.41 kg/m.     Glori Bickers, MD  3:12 PM

## 2020-05-29 NOTE — Addendum Note (Signed)
Encounter addended by: Malena Edman, RN on: 05/29/2020 3:38 PM  Actions taken: Visit diagnoses modified, Pharmacy for encounter modified, Order list changed, Diagnosis association updated, Clinical Note Signed

## 2020-05-29 NOTE — Patient Instructions (Signed)
Increase Carvedilol 6.25mg  twice daily   Decrease Amiodarone 100mg  Daily   Your physician has requested that you have an echocardiogram. Echocardiography is a painless test that uses sound waves to create images of your heart. It provides your doctor with information about the size and shape of your heart and how well your heart's chambers and valves are working. This procedure takes approximately one hour. There are no restrictions for this procedure.   Your physician recommends your follow up appointment in 4 months.  Your initial visit with our Pharmacist is recommended for 3-4 weeks   If you have any questions or concerns before your next appointment please send [place] a message through [place] or call our office at [phone].    TO LEAVE A MESSAGE FOR THE NURSE SELECT OPTION 2, PLEASE LEAVE A MESSAGE INCLUDING: . YOUR NAME . DATE OF BIRTH . CALL BACK NUMBER . REASON FOR CALL**this is important as we prioritize the call backs  [place] AS LONG AS YOU CALL BEFORE 4:00 PM   At the [facility], you and your health needs are our priority. As part of our continuing mission to provide you with exceptional heart care, we have created designated Provider Care Teams. These Care Teams include your primary Cardiologist (physician) and Advanced Practice Providers (APPs- Physician Assistants and Nurse Practitioners) who all work together to provide you with the care you need, when you need it.   You may see any of the following providers on your designated Care Team at your next follow up: [name] Dr Glori Bickers . Dr Loralie Champagne . Darrick Grinder, NP . Lyda Jester, PA . Audry Riles, PharmD   Please be sure to bring in all your medications bottles to every appointment.

## 2020-06-05 ENCOUNTER — Ambulatory Visit (HOSPITAL_COMMUNITY)
Admission: EM | Admit: 2020-06-05 | Discharge: 2020-06-05 | Disposition: A | Discharge status: Home / Self Care | Payer: Medicaid Other | Attending: Family Medicine | Admitting: Family Medicine

## 2020-06-05 ENCOUNTER — Encounter (HOSPITAL_COMMUNITY): Payer: Self-pay | Admitting: *Deleted

## 2020-06-05 ENCOUNTER — Other Ambulatory Visit: Payer: Self-pay

## 2020-06-05 DIAGNOSIS — M25431 Effusion, right wrist: Secondary | ICD-10-CM

## 2020-06-05 DIAGNOSIS — M7989 Other specified soft tissue disorders: Secondary | ICD-10-CM

## 2020-06-05 MED ORDER — TRAMADOL HCL 50 MG PO TABS: 50.0000 mg | ORAL_TABLET | Freq: Four times a day (QID) | ORAL | 0 refills | Status: DC | PRN

## 2020-06-05 NOTE — ED Triage Notes (Signed)
Patient reports right hand swelling, started Monday. Patients hand is noticeably swollen. Patient states he had an ART line after surgery a couple weeks ago. States right hand/forearm felt like this after surgery and went away now it is back. (had defibrillator replaced)   Patient appears very uncomfortable. Tylenol and ice not helping. States hand is numb--states numbness started yesterday.

## 2020-06-05 NOTE — Discharge Instructions (Addendum)
Keep your follow up appointment with your cardiologist tomorrow.  Be aware, you have been prescribed pain medications that may cause drowsiness. Do not combine with alcohol or other illicit drugs. Please do not drive, operate heavy machinery, or take part in activities that require making important decisions while on this medication as your judgement may be clouded.

## 2020-06-06 ENCOUNTER — Telehealth (HOSPITAL_COMMUNITY): Payer: Self-pay | Admitting: Family Medicine

## 2020-06-06 ENCOUNTER — Ambulatory Visit (HOSPITAL_BASED_OUTPATIENT_CLINIC_OR_DEPARTMENT_OTHER)
Admission: RE | Admit: 2020-06-06 | Discharge: 2020-06-06 | Disposition: A | Discharge status: Home / Self Care | Payer: Medicaid Other | Source: Ambulatory Visit | Attending: Family Medicine | Admitting: Family Medicine

## 2020-06-06 ENCOUNTER — Ambulatory Visit (HOSPITAL_COMMUNITY)
Admission: RE | Admit: 2020-06-06 | Discharge: 2020-06-06 | Disposition: A | Discharge status: Home / Self Care | Payer: Medicaid Other | Source: Ambulatory Visit | Attending: Internal Medicine | Admitting: Internal Medicine

## 2020-06-06 DIAGNOSIS — M7989 Other specified soft tissue disorders: Secondary | ICD-10-CM

## 2020-06-06 DIAGNOSIS — I10 Essential (primary) hypertension: Secondary | ICD-10-CM

## 2020-06-06 DIAGNOSIS — E785 Hyperlipidemia, unspecified: Secondary | ICD-10-CM | POA: Diagnosis not present

## 2020-06-06 DIAGNOSIS — I472 Ventricular tachycardia: Secondary | ICD-10-CM | POA: Insufficient documentation

## 2020-06-06 DIAGNOSIS — I110 Hypertensive heart disease with heart failure: Secondary | ICD-10-CM | POA: Diagnosis not present

## 2020-06-06 DIAGNOSIS — Z87891 Personal history of nicotine dependence: Secondary | ICD-10-CM | POA: Insufficient documentation

## 2020-06-06 DIAGNOSIS — E119 Type 2 diabetes mellitus without complications: Secondary | ICD-10-CM | POA: Insufficient documentation

## 2020-06-06 DIAGNOSIS — I5022 Chronic systolic (congestive) heart failure: Secondary | ICD-10-CM | POA: Insufficient documentation

## 2020-06-06 LAB — ECHOCARDIOGRAM COMPLETE
Area-P 1/2: 4.86 cm2
S' Lateral: 5.7 cm

## 2020-06-06 MED ORDER — APIXABAN 5 MG PO TABS
5.0000 mg | ORAL_TABLET | Freq: Once | ORAL | Status: AC
  Administered 2020-06-06: 5 mg via ORAL
  Filled 2020-06-06: qty 1

## 2020-06-06 MED ORDER — APIXABAN 5 MG PO TABS: ORAL_TABLET | ORAL | 10 refills | Status: DC

## 2020-06-06 NOTE — ED Provider Notes (Signed)
[place]   [id number] 06/05/20 Arrival Time: 3716  ASSESSMENT & PLAN:  1. Swelling of right hand   2. Swelling of right wrist     Feel he needs U/S to r/o thrombosis. No signs of arterial compromise. Declines ED evaluation this evening. Has ECHO tomorrow and hopes someone will be able to evaluate at that time. If not, he will proceed to ED.  Meds ordered this encounter  Medications  . traMADol (ULTRAM) 50 MG tablet    Sig: Take 1 tablet (50 mg total) by mouth every 6 (six) hours as needed.    Dispense:  10 tablet    Refill:  0   [place] Controlled Substances Registry consulted for this patient. I feel the risk/benefit ratio today is favorable for proceeding with this prescription for a controlled substance. Medication sedation precautions given.   Follow-up Information    [place].   Specialty: Emergency Medicine Why: If symptoms worsen in any way. Contact information: [address] [id number] [place] [phone]              Reviewed expectations re: course of current medical issues. Questions answered. Outlined signs and symptoms indicating need for more acute intervention. Understanding verbalized. After Visit Summary given.   SUBJECTIVE: History from: patient. Jeffrey [name] is a 44 y.o. male who reports R hand swelling. Art line approx 2 w ago during admission. Swelling first noted yesterday along with transient numbness; no current sensation changes or weakness. Hand is painful. Tylenol and ice without relief. No CP or SOB.   OBJECTIVE:  Vitals:   06/05/20 1513  BP: 121/85  Pulse: 78  Resp: 17  Temp: 98.2 F (36.8 C)  TempSrc: Oral  SpO2: 98%    General appearance: alert; no distress Lungs: speaks full sentences without difficulty; unlabored Extremities: distinct swelling of R wrist/hand beginning just proximally to R wrist; very painful to touch; normal radial  pulse; normal distal sensation Skin: warm and dry Psychological: alert and cooperative; normal mood and affect    Allergies  Allergen Reactions  . Aspirin Shortness Of Breath, Itching and Rash     Burning sensation (Patient reports he tolerates other NSAIDS)   . Bee Venom Hives and Swelling  . Lisinopril Cough  . Tomato Rash    Past Medical History:  Diagnosis Date  . Anxiety   . Aspirin allergy   . Childhood asthma   . Chronic systolic CHF (congestive heart failure) ([place])    a. EF 20-25% in 2012. b. EF 45-50% in 10/2011 with nonischemic nuc - presumed NICM. c. 12/2014 Echo: Sev depressed LV fxn, sev dil LV, mild LVH, mild MR, sev dil LA, mildly reduced RV fxn.  . CKD (chronic kidney disease) stage 2, GFR 60-89 ml/min   . H/O vasectomy 12/2019  . High cholesterol   . Hypertension   . Morbid obesity ([place])   . Nephrolithiasis   . OSA on CPAP   . Presumed NICM    a. 04/2014 Myoview: EF 26%, glob HK, sev glob HK, ? prior infarct;  b. Never cathed 2/2 CKD.  [name] Renal cell carcinoma ([place])    a. s/p Rt robotic assisted partial converted to radical nephrectomy on 01/2013.  . Troponin level elevated    a. 04/2014, 12/2014: felt due to CHF.  . Type II diabetes mellitus ([place])   . Ventricular tachycardia ([place])    a. appropriate ICD therapy 12/2017   Social History  Socioeconomic History  . Marital status: Significant Other    Spouse name: Not on file  . Number of children: Not on file  . Years of education: Not on file  . Highest education level: Not on file  Occupational History  . Not on file  Tobacco Use  . Smoking status: Former Smoker    Packs/day: 0.25    Years: 22.00    Pack years: 5.50    Quit date: 04/16/2015    Years since quitting: 5.1  . Smokeless tobacco: Never Used  Vaping Use  . Vaping Use: Never used  Substance and Sexual Activity  . Alcohol use: No    Comment: occ   . Drug use: Yes    Frequency: 14.0 times per week    Types: Marijuana    Comment: uses  marijuana twice a day  . Sexual activity: Yes    Birth control/protection: Condom  Other Topics Concern  . Not on file  Social History Narrative  . Not on file   Social Determinants of Health   Financial Resource Strain:   . Difficulty of Paying Living Expenses: Not on file  Food Insecurity:   . Worried About Charity fundraiser in the Last Year: Not on file  . Ran Out of Food in the Last Year: Not on file  Transportation Needs:   . Lack of Transportation (Medical): Not on file  . Lack of Transportation (Non-Medical): Not on file  Physical Activity:   . Days of Exercise per Week: Not on file  . Minutes of Exercise per Session: Not on file  Stress:   . Feeling of Stress : Not on file  Social Connections:   . Frequency of Communication with Friends and Family: Not on file  . Frequency of Social Gatherings with Friends and Family: Not on file  . Attends Religious Services: Not on file  . Active Member of Clubs or Organizations: Not on file  . Attends Archivist Meetings: Not on file  . Marital Status: Not on file  Intimate Partner Violence:   . Fear of Current or Ex-Partner: Not on file  . Emotionally Abused: Not on file  . Physically Abused: Not on file  . Sexually Abused: Not on file   Family History  Problem Relation Age of Onset  . Hypertension Mother   . Diabetes Maternal Aunt   . Heart attack Neg Hx   . Stroke Neg Hx    Past Surgical History:  Procedure Laterality Date  . APPENDECTOMY  07/2004  . CARDIAC CATHETERIZATION N/A 05/17/2015   Procedure: Right/Left Heart Cath and Coronary Angiography;  Surgeon: Jolaine Artist, MD;  Location: [place] [facility];  Service: Cardiovascular;  Laterality: N/A;  . EP IMPLANTABLE DEVICE N/A 05/17/2015   Procedure: SubQ ICD Implant;  Surgeon: Will Meredith Leeds, MD;  Location: [place] [facility];  Service: Cardiovascular;  Laterality: N/A;  . ROBOTIC ASSISTED LAPAROSCOPIC LYSIS OF ADHESION  01/13/2013   Procedure:  ROBOTIC ASSISTED LAPAROSCOPIC LYSIS OF ADHESION EXTENSIVE;  Surgeon: Alexis Frock, MD;  [facility];  Service: Urology;;  . ROBOTIC ASSITED PARTIAL NEPHRECTOMY Right 01/13/2013   Procedure: ROBOTIC ASSITED PARTIAL NEPHRECTOMY CONVERTED TO ROBOTIC ASSISTED RIGHT RADICAL NEPHRECTOMY;  Surgeon: Alexis Frock, MD;  [facility];  Service: Urology;  Laterality: Right;  . SUBQ ICD CHANGEOUT N/A 05/16/2020   Procedure: SUBQ ICD CHANGEOUT;  Surgeon: [name], MD;  Location: [place] [facility];  Service: Cardiovascular;  Laterality: N/A;  .  SUBQ ICD CHANGEOUT N/A 05/15/2020   Procedure: [place];  Surgeon: [name], MD;  Location: [place] [facility];  Service: Cardiovascular;  Laterality: N/A;  . Lady Deutscher, MD 06/06/20 [phone]

## 2020-06-06 NOTE — Patient Instructions (Addendum)
Information on my medicine - ELIQUIS (apixaban)    WHY WAS ELIQUIS PRESCRIBED FOR YOU?  Eliquis was prescribed to treat blood clots that may have been found in the veins of your legs (deep vein thrombosis) or in your lungs (pulmonary embolism) and to reduce the risk of them occurring again.    WHAT DO YOU NEED TO KNOW ABOUT ELIQUIS ?  The starting dose is 10 mg (two 5 mg tablets) taken TWICE daily for the FIRST SEVEN (7) DAYS, then on 06/13/2020 the dose is reduced to ONE 5 mg tablet taken TWICE daily. Eliquis may be taken with or without food.    Try to take the dose about the same time in the morning and in the evening. If you have difficulty swallowing the tablet whole please discuss with your pharmacist how to take the medication safely.    Take Eliquis exactly as prescribed and DO NOT stop taking Eliquis without talking to the doctor who prescribed the medication. Stopping may increase your risk of developing a new blood clot. Refill your prescription before you run out.   After discharge, you should have regular check-up appointments with your healthcare provider that is prescribing your Eliquis.   WHAT DO YOU DO IF YOU MISS A DOSE?  If a dose of ELIQUIS is not taken at the scheduled time, take it as soon as possible on the same day and twice-daily administration should be resumed. The dose should not be doubled to make up for a missed dose.    IMPORTANT SAFETY INFORMATION  A possible side effect of Eliquis is bleeding. You should call your healthcare provider right away if you experience any of the following:   Bleeding from an injury or your nose that does not stop.   Unusual colored urine (red or dark brown) or unusual colored stools (red or black).   Unusual bruising for unknown reasons.   A serious fall or if you hit your head (even if there is no bleeding).    Some medicines may interact with Eliquis and might increase your risk of bleeding or clotting while on  Eliquis. To help avoid this, consult your healthcare provider or pharmacist prior to using any new prescription or non-prescription medications, including herbals, vitamins, non-steroidal anti-inflammatory drugs (NSAIDs) and supplements.    This website has more information on Eliquis (apixaban): http://www.eliquis.com/eliquis/home

## 2020-06-06 NOTE — Progress Notes (Signed)
Patient presents today after urgent care visit last night. Duplex found DVT in right arm (single right radial vein) at urgent care visit last night 06/05/20. Patient remains on Eliquis 5mg  BID for atrial fibrillation. Patient is s/p SQ ICD replacement on 05/15/20. Patient reports he was off Eliquis from 05/13/20 - 05/18/20 around the procedure. He reports swelling that started around 8/11-8/12 that initially improved. Reports no missed doses since restarting Eliquis. He states that swelling and pain in right arm worsened significantly this Monday, 06/03/20.   Discussed situation and plan with Dr. [name], given the timing of symptoms, will not deem this as Eliquis failure. Educated patient on VTE dosing of Eliquis. He will start Eliquis 10mg  BID this morning for 7 days, then reduce back down to Eliquis 5mg  BID on 06/13/20. New prescription will be sent to pharmacy.   Richardine Service, PharmD

## 2020-06-06 NOTE — Progress Notes (Addendum)
Right upper extremity venous duplex completed. Refer to "CV Proc" under chart review to view preliminary results.  Attempted to call preliminary results to urgent care, however there was no answer. Preliminary results discussed with Nira Conn, RN with Dr. [name].  06/06/2020 9:59 AM Kelby Aline., MHA, RVT, RDCS, RDMS

## 2020-06-06 NOTE — Progress Notes (Signed)
Thank you :)

## 2020-06-06 NOTE — Progress Notes (Signed)
  Echocardiogram 2D Echocardiogram has been performed.  Jeffrey [name] 06/06/2020, 9:37 AM

## 2020-06-06 NOTE — Telephone Encounter (Signed)
Order for RUE U/S requested. Pt at U/S now. Order placed.

## 2020-06-06 NOTE — Progress Notes (Signed)
Pt added on for nurse/pharmacy visit today due to +DVT in R arm, see pharm note, pt aware to increase Eliquis to 10 mg BID for 7 days, new rx sent in, pt given extra 5 mg in office to equal 10 mg for the morning.

## 2020-06-06 NOTE — Discharge Instructions (Signed)
Information on my medicine - ELIQUIS (apixaban)  Why was Eliquis prescribed for you? Eliquis was prescribed to treat blood clots that may have been found in the veins of your legs (deep vein thrombosis) or in your lungs (pulmonary embolism) and to reduce the risk of them occurring again.  What do You need to know about Eliquis ? The starting dose is 10 mg (two 5 mg tablets) taken TWICE daily for the FIRST SEVEN (7) DAYS, then on 06/13/2020 the dose is reduced to ONE 5 mg tablet taken TWICE daily.  Eliquis may be taken with or without food.   Try to take the dose about the same time in the morning and in the evening. If you have difficulty swallowing the tablet whole please discuss with your pharmacist how to take the medication safely.  Take Eliquis exactly as prescribed and DO NOT stop taking Eliquis without talking to the doctor who prescribed the medication.  Stopping may increase your risk of developing a new blood clot.  Refill your prescription before you run out.  After discharge, you should have regular check-up appointments with your healthcare provider that is prescribing your Eliquis.    What do you do if you miss a dose? If a dose of ELIQUIS is not taken at the scheduled time, take it as soon as possible on the same day and twice-daily administration should be resumed. The dose should not be doubled to make up for a missed dose.  Important Safety Information A possible side effect of Eliquis is bleeding. You should call your healthcare provider right away if you experience any of the following: ? Bleeding from an injury or your nose that does not stop. ? Unusual colored urine (red or dark brown) or unusual colored stools (red or black). ? Unusual bruising for unknown reasons. ? A serious fall or if you hit your head (even if there is no bleeding).  Some medicines may interact with Eliquis and might increase your risk of bleeding or clotting while on Eliquis. To help  avoid this, consult your healthcare provider or pharmacist prior to using any new prescription or non-prescription medications, including herbals, vitamins, non-steroidal anti-inflammatory drugs (NSAIDs) and supplements.  This website has more information on Eliquis (apixaban): http://www.eliquis.com/eliquis/home

## 2020-06-12 NOTE — Progress Notes (Signed)
Primary Care Provider:  [name], FNP Primary Cardiologist:  Lizbeth Bark, MD  Primary HF Cardiologist: Bensimhon  HPI:  Jeffrey [name] is a 44 y.o. 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    Admitted to [facility] in 3/16 secondary to atypical chest pain, dyspnea, and mild troponin elevation. Echo during hospitalization shows "severely reduced LV function" - EF not quantified due to poor windows.  Admitted 05/14/15 with CP at rest. [facility] showed normal coronaries and moderately elevated filling pressures with moderately depressed cardiac output. [place] subcutaneous ICD was implanted  Admitted 9/4 - 0/2/63 with A/C systolic CHF in the setting of new afib. Started on IV furosemide and amiodarone. Pt diuresed and converted to NSR without requiring DCCV. Started on Eliquis for [facility]. Continued on amiodarone on discharge with wean over several weeks.  Admitted 3/27 - 01/01/18 with ICD shock. Electrolytes stable. Echo 12/31/17 with LVEF 25-30%.   Pt admitted 05/15/20 for S-ICD generator change. Course was complicated with acute hypoxic/hypercapnic respiratory failure due to possible flash pulmonary edema. Had traumatic intubation resulting in aspiration and requiring ventilator support and short term pressor support. On 05/16/20 pt stable on vent, off pressors. Pt generator change completed. Covered with ABX with concern for aspiration PNA with plan for Augmentin for 5 day course on discharge.  Recently returned for post-hospital follow up on 05/29/20. Was feeling good. Reported walking 2.5 miles per day. Denied SOB, edema, orthopnea or PND.   Today he returns to HF clinic for pharmacist medication titration. At last visit with MD, amiodarone was decreased to 100 mg daily and carvedilol was increased to 6.25 mg BID. Unfortunately, the correct dose was not sent to  the pharmacy, so he is still taking carvedilol 3.125 mg BID. Overall he is feeling well today. No dizziness, lightheadedness, chest pain or palpitations. No SOB/DOE. He takes torsemide 40 mg daily and has not needed any extra. His weight has been stable at home at 320-325 lbs. No LEE,  PND or orthopnea. Appetite is good. Follows a low salt diet. Recently started on a prednisone taper by his PCP.    HF Medications: Carvedilol 3.125 mg BID Entresto 97/103 mg BID Spironolactone 12.5 mg daily Bidil 20-37.5 mg 1 tablet TID Torsemide 40 mg daily  Has the patient been experiencing any side effects to the medications prescribed?  no  Does the patient have any problems obtaining medications due to transportation or finances?   No - Has BC[employer] Medicaid  Understanding of regimen: good Understanding of indications: good Potential of compliance: good Patient understands to avoid NSAIDs. Patient understands to avoid decongestants.    Pertinent Lab Values: . Serum creatinine 1.54, BUN 25, Potassium 4.1, Sodium 139  Vital Signs: . Weight: 326 lbs (last clinic weight: 328.3 lbs) . Blood pressure: 120/84  . Heart rate: 72   Assessment: 1.  Chronic systolic congestive heart failure: s/p [employer] subcutaneous ICD implant 05/17/15 (gen change 8/21 - c/b traumatic intubation).  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  - Echo 12/31/17 LVEF 25-30%, Moderate MR, mild RV dilation, Severe RAE, Mod TR, Pa peak pressure 67 mm Hg.  - NYHA II, euvolemic on exam - Continue torsemide 40 mg daily  - Continue carvedilol 3.125 mg BID. Plan to increase next visit.  - Continue Entresto 97/103 mg BID. Watch closely with solitary kidney.  - Continue spironolactone 12.5 daily -  Increase Bidil 20-37.5 mg to 1.5 tab TID  - Was asked him to speak with PCP re addition of Farxiga, but he forgot at his most recent visit.  2. Paroxysmal Afib - New diagnosis 06/2017.  - Maintaining NSR - Continue Eliquis 5 mg BID.  -  Continue amiodarone 100 daily.   - Will need yearly eye exams.  3. VT with ICD shock 12/29/17 - Continue amiodarone 100 daily.  No syncope or palpitations. 4. Hypertension:  - Stable.  5. CKD stage III -creatinine 1.5-1.7.  6. OSA:  - Back on CPAP.  7. Tobacco use: - Former smoker. No change.  8. Gout:  - Continue allopurinol 200 daily for prophylaxis. No change.  9. DM2:  - Stable. Per PCP.  10.Renal Cell Carcinoma - S/P nephrectomy 2014.   Follow closely in setting of solitary kidney.  - Follows at [facility] and has follow up next month.  11. Morbid Obesity - Body mass index is 51.41 kg/m.     Plan: 1) Medication changes: Based on clinical presentation, vital signs and recent labs will increase Bidil to 1.5 tablets TID 2) Follow-up: 4 weeks with Pharmacy Clinic   Audry Riles, PharmD, BCPS, BCCP, CPP Heart Failure Clinic Pharmacist [phone]

## 2020-06-13 ENCOUNTER — Other Ambulatory Visit: Payer: Self-pay | Admitting: Family Medicine

## 2020-06-13 DIAGNOSIS — F419 Anxiety disorder, unspecified: Secondary | ICD-10-CM

## 2020-06-13 MED FILL — SPIRONOLACTONE 25 MG TABLET: 25 | 30 days supply | Qty: 15 | Fill #1

## 2020-06-13 MED FILL — BIDIL 20-37.5 MG TABS: 20-37.5 | 30 days supply | Qty: 90 | Fill #1

## 2020-06-13 MED FILL — MECLIZINE 25 MG TABLET: 25 | 10 days supply | Qty: 30 | Fill #2

## 2020-06-13 MED FILL — AMIODARONE HCL 200 MG TAB: 200 | 15 days supply | Qty: 15 | Fill #1

## 2020-06-19 ENCOUNTER — Ambulatory Visit: Payer: Medicaid Other | Admitting: Family Medicine

## 2020-06-24 ENCOUNTER — Other Ambulatory Visit: Payer: Self-pay

## 2020-06-24 ENCOUNTER — Ambulatory Visit (INDEPENDENT_AMBULATORY_CARE_PROVIDER_SITE_OTHER): Payer: Medicaid Other | Admitting: Family Medicine

## 2020-06-24 ENCOUNTER — Encounter: Payer: Self-pay | Admitting: Family Medicine

## 2020-06-24 ENCOUNTER — Other Ambulatory Visit: Payer: Self-pay | Admitting: Family Medicine

## 2020-06-24 VITALS — BP 109/79 | HR 77 | Temp 98.4°F | Resp 17 | Ht 67.0 in | Wt 323.6 lb

## 2020-06-24 DIAGNOSIS — G4700 Insomnia, unspecified: Secondary | ICD-10-CM

## 2020-06-24 DIAGNOSIS — Z794 Long term (current) use of insulin: Secondary | ICD-10-CM | POA: Diagnosis not present

## 2020-06-24 DIAGNOSIS — R8290 Unspecified abnormal findings in urine: Secondary | ICD-10-CM

## 2020-06-24 DIAGNOSIS — Z7189 Other specified counseling: Secondary | ICD-10-CM

## 2020-06-24 DIAGNOSIS — R7303 Prediabetes: Secondary | ICD-10-CM

## 2020-06-24 DIAGNOSIS — N183 Chronic kidney disease, stage 3 unspecified: Secondary | ICD-10-CM

## 2020-06-24 DIAGNOSIS — F419 Anxiety disorder, unspecified: Secondary | ICD-10-CM

## 2020-06-24 DIAGNOSIS — M25441 Effusion, right hand: Secondary | ICD-10-CM | POA: Diagnosis not present

## 2020-06-24 DIAGNOSIS — R42 Dizziness and giddiness: Secondary | ICD-10-CM

## 2020-06-24 DIAGNOSIS — R739 Hyperglycemia, unspecified: Secondary | ICD-10-CM | POA: Diagnosis not present

## 2020-06-24 DIAGNOSIS — J302 Other seasonal allergic rhinitis: Secondary | ICD-10-CM

## 2020-06-24 DIAGNOSIS — E1122 Type 2 diabetes mellitus with diabetic chronic kidney disease: Secondary | ICD-10-CM

## 2020-06-24 DIAGNOSIS — E119 Type 2 diabetes mellitus without complications: Secondary | ICD-10-CM

## 2020-06-24 DIAGNOSIS — I10 Essential (primary) hypertension: Secondary | ICD-10-CM | POA: Diagnosis not present

## 2020-06-24 DIAGNOSIS — M109 Gout, unspecified: Secondary | ICD-10-CM | POA: Diagnosis not present

## 2020-06-24 DIAGNOSIS — Z6841 Body Mass Index (BMI) 40.0 and over, adult: Secondary | ICD-10-CM

## 2020-06-24 DIAGNOSIS — Z09 Encounter for follow-up examination after completed treatment for conditions other than malignant neoplasm: Secondary | ICD-10-CM

## 2020-06-24 DIAGNOSIS — I5022 Chronic systolic (congestive) heart failure: Secondary | ICD-10-CM

## 2020-06-24 DIAGNOSIS — R634 Abnormal weight loss: Secondary | ICD-10-CM

## 2020-06-24 DIAGNOSIS — E66813 Obesity, class 3: Secondary | ICD-10-CM

## 2020-06-24 LAB — POCT URINALYSIS DIPSTICK
Bilirubin, UA: NEGATIVE
Glucose, UA: NEGATIVE
Ketones, UA: NEGATIVE
Leukocytes, UA: NEGATIVE
Nitrite, UA: NEGATIVE
Protein, UA: POSITIVE — AB
Spec Grav, UA: 1.025 (ref 1.010–1.025)
Urobilinogen, UA: 0.2 E.U./dL
pH, UA: 6 (ref 5.0–8.0)

## 2020-06-24 LAB — POCT GLYCOSYLATED HEMOGLOBIN (HGB A1C)
HbA1c POC (<> result, manual entry): 7.5 % (ref 4.0–5.6)
HbA1c, POC (controlled diabetic range): 7.5 % — AB (ref 0.0–7.0)
HbA1c, POC (prediabetic range): 7.5 % — AB (ref 5.7–6.4)
Hemoglobin A1C: 7.5 % — AB (ref 4.0–5.6)

## 2020-06-24 LAB — GLUCOSE, POCT (MANUAL RESULT ENTRY): POC Glucose: 193 mg/dl — AB (ref 70–99)

## 2020-06-24 MED ORDER — PREDNISONE 10 MG PO TABS: ORAL_TABLET | ORAL | 0 refills | Status: DC

## 2020-06-24 MED ORDER — BUSPIRONE HCL 10 MG PO TABS: 10.0000 mg | ORAL_TABLET | Freq: Two times a day (BID) | ORAL | 11 refills | Status: DC

## 2020-06-24 MED ORDER — METFORMIN HCL 1000 MG PO TABS: 1000.0000 mg | ORAL_TABLET | Freq: Two times a day (BID) | ORAL | 11 refills | Status: DC

## 2020-06-24 MED FILL — busPIRone HCL 10 MG TABS: 10 | 30 days supply | Qty: 60 | Fill #0

## 2020-06-24 MED FILL — metFORMIN HCL 1000 MG TABS: 1000 | 30 days supply | Qty: 60 | Fill #0

## 2020-06-24 MED FILL — predniSONE 10 MG TABS: 10 | 6 days supply | Qty: 21 | Fill #0

## 2020-06-24 NOTE — Progress Notes (Signed)
Patient [facility] Follow Up  Subjective:  Patient ID: Jeffrey [name], male    DOB: 03-23-1976  Age: 44 y.o. MRN: [id number]  CC:  Chief Complaint  Patient presents with  . Follow-up    Pt states he wants to discuss he went to urgent care X2wks ago.Pt states they found a blood clot in his R forearm . Pt states he is having some pain and slight swellen in R hand.    HPI Jeffrey [name] is a 44 year old male who presents for Follow Up today.    Patient Active Problem List   Diagnosis Date Noted  . Aspiration into airway   . ICD (implantable cardioverter-defibrillator) battery depletion 05/15/2020  . Endotracheal tube present   . Respiratory failure ([place])   . Acute encephalopathy   . Acute pulmonary edema (HCC)   . NICM (nonischemic cardiomyopathy) ([place]) 04/24/2020  . S/P vasectomy 12/20/2019  . Anxiety 12/20/2019  . Hemoglobin A1C between 7% and 9% indicating borderline diabetic control 12/20/2019  . Seasonal allergies 12/20/2019  . Insomnia 12/20/2019  . Vasectomy evaluation 06/20/2019  . Visual problems 06/20/2019  . Blurry vision, bilateral 06/20/2019  . Hyperglycemia 02/13/2019  . Hyperosmolar (nonketotic) coma ([place]) 01/19/2019  . AICD (automatic cardioverter/defibrillator) present 01/19/2019  . Hyperlipidemia 12/07/2018  . Follow-up exam 11/22/2018  . Class 3 severe obesity due to excess calories with serious comorbidity and body mass index (BMI) of 45.0 to 49.9 in adult ([place]) 11/22/2018  . Dizziness 11/22/2018  . Lingular pneumonia 08/04/2018  . Ventricular tachycardia ([place]) 12/30/2017  . PAF (paroxysmal atrial fibrillation) ([place])   . Dilated cardiomyopathy ([place])   . Pollen allergy 02/17/2017  . Dyslipidemia 02/17/2017  . Hematochezia 11/08/2015  . CKD (chronic kidney disease), stage III ([place]) 06/20/2015  . Cardiac defibrillator in situ   . Chronic systolic CHF (congestive heart failure) ([place]) 03/11/2015  .  Gouty arthritis 03/11/2015  . Chest pain 12/28/2014  . Aspirin allergy 04/08/2014  . Renal cell carcinoma ([place])   . Gout attack 11/10/2011  . Morbid obesity ([place]) 10/23/2011  . OSA (obstructive sleep apnea) 10/23/2011  . Type 2 diabetes mellitus with stage 3 chronic kidney disease, with long-term current use of insulin ([place]) 10/22/2011  . Hyperlipidemia associated with type 2 diabetes mellitus ([place]) 12/25/2010  . Hypertension associated with diabetes ([place]) 12/25/2010   Current Status: Since his last office visit, he has had Hospital visit for ECHO on 06/06/2020 and went to the Urgent Care on 06/05/2020 for Right Hand Pain/Swelling. He states that he continues to have moderate hand pain. He also had recent Generator change on 05/15/2020. Today, he is doing well with no complaints. He denies visual changes, chest pain, cough, shortness of breath, heart palpitations, and falls. He has occasional headaches and dizziness with position changes. Denies severe headaches, confusion, seizures, double vision, and blurred vision, nausea and vomiting. His most recent normal range of preprandial blood glucose levels have been between 100-125. He has seen low range of 60, which he did eat a snack to bring blood glucose levels back up; and high of 125 since his last office visit. He denies fatigue, frequent urination, blurred vision, excessive hunger, excessive thirst, weight gain, weight loss, and poor wound healing. He continues to check his feet regularly. His anxiety is mild today. He denies suicidal ideations, homicidal ideations, or auditory hallucinations. He denies fevers, chills, recent infections, weight loss, and night sweats. Denies GI  problems such as diarrhea, and constipation. He has no reports of blood in stools, dysuria and  hematuria. He is taking all medications as prescribed.  Past Medical History:  Diagnosis Date  . Anxiety   . Aspirin allergy   . Childhood asthma   . Chronic systolic CHF  (congestive heart failure) ([place])    a. EF 20-25% in 2012. b. EF 45-50% in 10/2011 with nonischemic nuc - presumed NICM. c. 12/2014 Echo: Sev depressed LV fxn, sev dil LV, mild LVH, mild MR, sev dil LA, mildly reduced RV fxn.  . CKD (chronic kidney disease) stage 2, GFR 60-89 ml/min   . H/O vasectomy 12/2019  . High cholesterol   . Hypertension   . Morbid obesity ([place])   . Nephrolithiasis   . OSA on CPAP   . Presumed NICM    a. 04/2014 Myoview: EF 26%, glob HK, sev glob HK, ? prior infarct;  b. Never cathed 2/2 CKD.  [name] Renal cell carcinoma ([place])    a. s/p Rt robotic assisted partial converted to radical nephrectomy on 01/2013.  . Troponin level elevated    a. 04/2014, 12/2014: felt due to CHF.  . Type II diabetes mellitus ([place])   . Ventricular tachycardia ([place])    a. appropriate ICD therapy 12/2017    Past Surgical History:  Procedure Laterality Date  . APPENDECTOMY  07/2004  . CARDIAC CATHETERIZATION N/A 05/17/2015   Procedure: Right/Left Heart Cath and Coronary Angiography;  Surgeon: Jolaine Artist, MD;  Location: [facility];  Service: Cardiovascular;  Laterality: N/A;  . EP IMPLANTABLE DEVICE N/A 05/17/2015   Procedure: SubQ ICD Implant;  Surgeon: Will Meredith Leeds, MD;  Location: [facility];  Service: Cardiovascular;  Laterality: N/A;  . ROBOTIC ASSISTED LAPAROSCOPIC LYSIS OF ADHESION  01/13/2013   Procedure: ROBOTIC ASSISTED LAPAROSCOPIC LYSIS OF ADHESION EXTENSIVE;  Surgeon: Alexis Frock, MD;  Location: [facility];  Service: Urology;;  . ROBOTIC ASSITED PARTIAL NEPHRECTOMY Right 01/13/2013   Procedure: ROBOTIC ASSITED PARTIAL NEPHRECTOMY CONVERTED TO ROBOTIC ASSISTED RIGHT RADICAL NEPHRECTOMY;  Surgeon: Alexis Frock, MD;  Location: [facility];  Service: Urology;  Laterality: Right;  . SUBQ ICD CHANGEOUT N/A 05/16/2020   Procedure: SUBQ ICD CHANGEOUT;  Surgeon: [name], MD;  Location: [facility];  Service: Cardiovascular;  Laterality: N/A;  . SUBQ ICD  CHANGEOUT N/A 05/15/2020   Procedure: SJGG ICD CHANGEOUT;  Surgeon: [name], MD;  Location: [facility];  Service: Cardiovascular;  Laterality: N/A;  . VASECTOMY      Family History  Problem Relation Age of Onset  . Hypertension Mother   . Diabetes Maternal Aunt   . Heart attack Neg Hx   . Stroke Neg Hx     Social History   Socioeconomic History  . Marital status: Significant Other    Spouse name: Not on file  . Number of children: Not on file  . Years of education: Not on file  . Highest education level: Not on file  Occupational History  . Not on file  Tobacco Use  . Smoking status: Former Smoker    Packs/day: 0.25    Years: 22.00    Pack years: 5.50    Quit date: 04/16/2015    Years since quitting: 5.2  . Smokeless tobacco: Never Used  Vaping Use  . Vaping Use: Never used  Substance and Sexual Activity  . Alcohol use: No    Comment: occ   . Drug use:  Yes    Frequency: 14.0 times per week    Types: Marijuana    Comment: uses marijuana twice a day  . Sexual activity: Yes    Birth control/protection: Condom  Other Topics Concern  . Not on file  Social History Narrative  . Not on file   Social Determinants of Health   Financial Resource Strain:   . Difficulty of Paying Living Expenses: Not on file  Food Insecurity:   . Worried About Charity fundraiser in the Last Year: Not on file  . Ran Out of Food in the Last Year: Not on file  Transportation Needs:   . Lack of Transportation (Medical): Not on file  . Lack of Transportation (Non-Medical): Not on file  Physical Activity:   . Days of Exercise per Week: Not on file  . Minutes of Exercise per Session: Not on file  Stress:   . Feeling of Stress : Not on file  Social Connections:   . Frequency of Communication with Friends and Family: Not on file  . Frequency of Social Gatherings with Friends and Family: Not on file  . Attends Religious Services: Not on file  . Active Member of Clubs or  Organizations: Not on file  . Attends Archivist Meetings: Not on file  . Marital Status: Not on file  Intimate Partner Violence:   . Fear of Current or Ex-Partner: Not on file  . Emotionally Abused: Not on file  . Physically Abused: Not on file  . Sexually Abused: Not on file    Outpatient Medications Prior to Visit  Medication Sig Dispense Refill  . Accu-Chek FastClix Lancets MISC USE AS DIRECTED FOUR TIMES DAILY 102 each 11  . acetaminophen (TYLENOL) 325 MG tablet Take 2 tablets (650 mg total) by mouth every 4 (four) hours as needed for mild pain, moderate pain, fever or headache.    [name] apixaban (ELIQUIS) 5 MG TABS tablet Take 2 tablets (10 mg total) by mouth 2 (two) times daily for 7 days, THEN 1 tablet (5 mg total) 2 (two) times daily. 75 tablet 10  . blood glucose meter kit and supplies Dispense based on patient and insurance preference. Use up to four times daily as directed. (FOR ICD-10 E10.9, E11.9). 1 each 0  . carvedilol (COREG) 6.25 MG tablet Take 0.5 tablets (3.125 mg total) by mouth 2 (two) times daily. 30 tablet 6  . Continuous Blood Gluc Receiver (FREESTYLE LIBRE 14 DAY READER) DEVI 1 each by Does not apply route 3 (three) times daily between meals as needed. 1 each 1  . Continuous Blood Gluc Sensor (GUARDIAN SENSOR 3) MISC 1 Device by Does not apply route 3 (three) times daily between meals as needed. 5 each 6  . fluticasone (FLONASE) 50 MCG/ACT nasal spray Place 2 sprays into both nostrils daily. 16 g 6  . glucose blood (ACCU-CHEK GUIDE) test strip USE AS DIRECTED FOUR TIMES DAILY 100 strip 11  . Insulin Pen Needle (TRUEPLUS 5-BEVEL PEN NEEDLES) 31G X 6 MM MISC 1 Syringe by Does not apply route as needed. 100 each 12  . Insulin Syringe-Needle U-100 (TRUEPLUS INSULIN SYRINGE) 31G X 5/16" 0.5 ML MISC Use to inject insulin twice daily. 100 each 11  . Magnesium Oxide 200 MG TABS Take 100 mg by mouth daily.    . Vitamin D, Ergocalciferol, (DRISDOL) 1.25 MG (50000 UT)  CAPS capsule Take 1 capsule (50,000 Units total) by mouth once a week. 5 capsule 6  . allopurinol (  ZYLOPRIM) 100 MG tablet Take 1 tablet (100 mg total) by mouth daily. 30 tablet 6  . amiodarone (PACERONE) 100 MG tablet Take 2 tablets (200 mg total) by mouth daily. 30 tablet 6  . busPIRone (BUSPAR) 10 MG tablet Take 1 tablet (10 mg total) by mouth 2 (two) times daily. 60 tablet 3  . cetirizine (ZYRTEC) 10 MG tablet Take 1 tablet (10 mg total) by mouth daily. 30 tablet 6  . Colchicine (MITIGARE) 0.6 MG CAPS Take 1 tablet by mouth 2 (two) times daily as needed (gout). 14 capsule 0  . gabapentin (NEURONTIN) 300 MG capsule Take 1 capsule (300 mg total) by mouth 3 (three) times daily. 90 capsule 6  . glipiZIDE (GLUCOTROL) 10 MG tablet Take 1 tablet (10 mg total) by mouth 2 (two) times daily before a meal. 60 tablet 6  . insulin aspart (NOVOLOG FLEXPEN) 100 UNIT/ML FlexPen Inject 0-15 Units into the skin 3 (three) times daily with meals. CBG 121-150: 2u; CBG 151-200: 3u; CBG 201-250: 5u; CBG 251-300: 8u; CBG 301-350: 11u; CBG 351-400:15 u; CBG>400: 15u 15 mL 12  . insulin NPH-regular Human (70-30) 100 UNIT/ML injection Inject 38 Units into the skin 2 (two) times daily with a meal. 60 mL 12  . isosorbide-hydrALAZINE (BIDIL) 20-37.5 MG tablet Take 1 tablet by mouth 3 (three) times daily. Needs appt 90 tablet 6  . meclizine (ANTIVERT) 25 MG tablet Take 1 tablet (25 mg total) by mouth 3 (three) times daily. 30 tablet 6  . metFORMIN (GLUCOPHAGE) 1000 MG tablet Take 1 tablet (1,000 mg total) by mouth 2 (two) times daily with a meal. 60 tablet 6  . sacubitril-valsartan (ENTRESTO) 97-103 MG Take 1 tablet by mouth 2 (two) times daily. 60 tablet 6  . spironolactone (ALDACTONE) 25 MG tablet Take 0.5 tablets (12.5 mg total) by mouth daily. 45 tablet 6  . torsemide (DEMADEX) 20 MG tablet Take 2 tablets (40 mg total) by mouth daily. 60 tablet 6  . traZODone (DESYREL) 100 MG tablet Take 1 tablet (100 mg total) by mouth at  bedtime. For sleep 30 tablet 3  . amoxicillin-clavulanate (AUGMENTIN) 875-125 MG tablet Take 1 tablet by mouth 2 (two) times daily. For 5 days total. No refills. (Patient not taking: Reported on 06/24/2020) 9 tablet 0  . traMADol (ULTRAM) 50 MG tablet Take 1 tablet (50 mg total) by mouth every 6 (six) hours as needed. 10 tablet 0   No facility-administered medications prior to visit.    Allergies  Allergen Reactions  . Aspirin Shortness Of Breath, Itching and Rash     Burning sensation (Patient reports he tolerates other NSAIDS)   . Bee Venom Hives and Swelling  . Lisinopril Cough  . Tomato Rash    ROS Review of Systems  Constitutional: Negative.   HENT: Negative.   Eyes: Negative.   Respiratory: Negative.   Cardiovascular: Negative.   Gastrointestinal: Positive for abdominal distention (obese).  Endocrine: Negative.   Genitourinary: Negative.   Musculoskeletal: Positive for arthralgias (generalized joint pain).  Skin: Negative.   Allergic/Immunologic: Negative.   Neurological: Positive for dizziness (occasional ) and headaches (occasional ).  Hematological: Negative.   Psychiatric/Behavioral: Negative.       Objective:    Physical Exam Vitals and nursing note reviewed.  Constitutional:      Appearance: Normal appearance.  HENT:     Head: Normocephalic and atraumatic.     Nose: Nose normal.     Mouth/Throat:     Mouth: Mucous membranes  are moist.     Pharynx: Oropharynx is clear.  Cardiovascular:     Rate and Rhythm: Normal rate and regular rhythm.     Pulses: Normal pulses.     Heart sounds: Normal heart sounds.  Pulmonary:     Effort: Pulmonary effort is normal.     Breath sounds: Normal breath sounds.  Abdominal:     General: Bowel sounds are normal. There is distension (obese).     Palpations: Abdomen is soft.  Musculoskeletal:     Cervical back: Normal range of motion and neck supple.  Skin:    General: Skin is warm and dry.  Neurological:      General: No focal deficit present.     Mental Status: He is alert and oriented to person, place, and time.  Psychiatric:        Mood and Affect: Mood normal.        Behavior: Behavior normal.        Thought Content: Thought content normal.        Judgment: Judgment normal.     BP 109/79 (BP Location: Right Arm, Patient Position: Sitting, Cuff Size: Large)   Pulse 77   Temp 98.4 F (36.9 C)   Resp 17   Ht '5\' 7"'  (1.702 m)   Wt (!) 323 lb 9.6 oz (146.8 kg)   SpO2 96%   BMI 50.68 kg/m  Wt Readings from Last 3 Encounters:  06/26/20 (!) 326 lb (147.9 kg)  06/24/20 (!) 323 lb 9.6 oz (146.8 kg)  05/29/20 (!) 328 lb 4 oz (148.9 kg)     Health Maintenance Due  Topic Date Due  . Hepatitis C Screening  Never done  . FOOT EXAM  11/22/2019  . URINE MICROALBUMIN  11/22/2019  . LIPID PANEL  03/16/2020  . INFLUENZA VACCINE  05/05/2020    There are no preventive care reminders to display for this patient.  Lab Results  Component Value Date   TSH 2.560 12/18/2019   Lab Results  Component Value Date   WBC 14.2 (H) 05/17/2020   HGB 13.4 05/17/2020   HCT 41.2 05/17/2020   MCV 84.8 05/17/2020   PLT 391 05/17/2020   Lab Results  Component Value Date   NA 139 05/17/2020   K 4.1 05/17/2020   CO2 22 05/17/2020   GLUCOSE 135 (H) 05/17/2020   BUN 25 (H) 05/17/2020   CREATININE 1.54 (H) 05/17/2020   BILITOT 0.4 05/16/2020   ALKPHOS 42 05/16/2020   AST 19 05/16/2020   ALT 23 05/16/2020   PROT 6.2 (L) 05/16/2020   ALBUMIN 3.0 (L) 05/16/2020   CALCIUM 9.0 05/17/2020   ANIONGAP 10 05/17/2020   GFR 58.42 (L) 01/22/2015   Lab Results  Component Value Date   CHOL 125 03/17/2019   Lab Results  Component Value Date   HDL 34 (L) 03/17/2019   Lab Results  Component Value Date   LDLCALC 43 03/17/2019   Lab Results  Component Value Date   TRIG 240 (H) 03/17/2019   Lab Results  Component Value Date   CHOLHDL 3.7 03/17/2019   Lab Results  Component Value Date   HGBA1C 7.5  (A) 06/24/2020   HGBA1C 7.5 06/24/2020   HGBA1C 7.5 (A) 06/24/2020   HGBA1C 7.5 (A) 06/24/2020    Assessment & Plan:   1. Hospital discharge follow-up  2. Swelling of joint of right hand Improved. Not worsening.   3. Type 2 diabetes mellitus with stage 3 chronic kidney disease, with  long-term current use of insulin, unspecified whether stage 3a or 3b CKD ([place]) He will continue medication as prescribed, to decrease foods/beverages high in sugars and carbs and follow Heart Healthy or DASH diet. Increase physical activity to at least 30 minutes cardio exercise daily.  - Urinalysis Dipstick - POC Glucose (CBG) - POC HgB A1c  4. Hemoglobin A1C between 7% and 9% indicating borderline diabetic control Hgb A1c is stable at 7.5 today. Monitor.   5. Hyperglycemia  6. Essential hypertension The current medical regimen is effective; blood pressure is stable at 109/79 today; continue present plan and medications as prescribed. He will continue to take medications as prescribed, to decrease high sodium intake, excessive alcohol intake, increase potassium intake, smoking cessation, and increase physical activity of at least 30 minutes of cardio activity daily. He will continue to follow Heart Healthy or DASH diet.  7. Anxiety Stable.  8. Insomnia, unspecified type  9. Class 3 severe obesity due to excess calories with serious comorbidity and body mass index (BMI) of 50.0 to 59.9 in adult [facility]) Body mass index is 50.68 kg/m. Goal BMI  is <30. Encouraged efforts to reduce weight include engaging in physical activity as tolerated with goal of 150 minutes per week. Improve dietary choices and eat a meal regimen consistent with a Mediterranean or DASH diet. Reduce simple carbohydrates. Do not skip meals and eat healthy snacks throughout the day to avoid over-eating at dinner. Set a goal weight loss that is achievable for you.  10. Weight loss  11. Educated about 2019 novel coronavirus  infection  12. Follow up He will follow up in 3 months for Annual Physical and Labs.   Meds ordered this encounter  Medications  . busPIRone (BUSPAR) 10 MG tablet    Sig: Take 1 tablet (10 mg total) by mouth 2 (two) times daily.    Dispense:  60 tablet    Refill:  11  . metFORMIN (GLUCOPHAGE) 1000 MG tablet    Sig: Take 1 tablet (1,000 mg total) by mouth 2 (two) times daily with a meal.    Dispense:  60 tablet    Refill:  11  . predniSONE (DELTASONE) 10 MG tablet    Sig: Day #1: Take 6 tablets by mouth Day #2: Take 5 tablets by mouth Day #3: Take 4 tablets by mouth Day #4: Take 3 tablets by mouth Day #5: Take 2 tablets by mouth Day #6: Take 1 tablet, then complete.    Dispense:  21 tablet    Refill:  0  . allopurinol (ZYLOPRIM) 100 MG tablet    Sig: Take 1 tablet (100 mg total) by mouth daily.    Dispense:  90 tablet    Refill:  3  . cetirizine (ZYRTEC) 10 MG tablet    Sig: Take 1 tablet (10 mg total) by mouth daily.    Dispense:  90 tablet    Refill:  3  . Colchicine (MITIGARE) 0.6 MG CAPS    Sig: Take 1 tablet by mouth 2 (two) times daily as needed (gout).    Dispense:  30 capsule    Refill:  6  . gabapentin (NEURONTIN) 300 MG capsule    Sig: Take 1 capsule (300 mg total) by mouth 3 (three) times daily.    Dispense:  270 capsule    Refill:  3  . glipiZIDE (GLUCOTROL) 10 MG tablet    Sig: Take 1 tablet (10 mg total) by mouth 2 (two) times daily before a meal.  Dispense:  180 tablet    Refill:  3  . insulin aspart (NOVOLOG FLEXPEN) 100 UNIT/ML FlexPen    Sig: Inject 0-15 Units into the skin 3 (three) times daily with meals. CBG 121-150: 2u; CBG 151-200: 3u; CBG 201-250: 5u; CBG 251-300: 8u; CBG 301-350: 11u; CBG 351-400:15 u; CBG>400: 15u    Dispense:  45 mL    Refill:  3  . insulin NPH-regular Human (70-30) 100 UNIT/ML injection    Sig: Inject 38 Units into the skin 2 (two) times daily with a meal.    Dispense:  60 mL    Refill:  12  . meclizine (ANTIVERT) 25 MG  tablet    Sig: Take 1 tablet (25 mg total) by mouth 3 (three) times daily.    Dispense:  90 tablet    Refill:  3  . sacubitril-valsartan (ENTRESTO) 97-103 MG    Sig: Take 1 tablet by mouth 2 (two) times daily.    Dispense:  180 tablet    Refill:  3    Please cancel all previous orders for current medication. Change in dosage or pill size.  [name] spironolactone (ALDACTONE) 25 MG tablet    Sig: Take 0.5 tablets (12.5 mg total) by mouth daily.    Dispense:  45 tablet    Refill:  6  . torsemide (DEMADEX) 20 MG tablet    Sig: Take 2 tablets (40 mg total) by mouth daily.    Dispense:  60 tablet    Refill:  6  . traZODone (DESYREL) 100 MG tablet    Sig: Take 1 tablet (100 mg total) by mouth at bedtime. For sleep    Dispense:  90 tablet    Refill:  3   Orders Placed This Encounter  Procedures  . Urine Culture  . Urinalysis Dipstick  . POC Glucose (CBG)  . POC HgB A1c    Kathe Becton,  MSN, FNP-BC [facility] [place], [place] [address] [phone] fax   Problem List Items Addressed This Visit      Endocrine   Hemoglobin A1C between 7% and 9% indicating borderline diabetic control   Relevant Medications   metFORMIN (GLUCOPHAGE) 1000 MG tablet   glipiZIDE (GLUCOTROL) 10 MG tablet   insulin aspart (NOVOLOG FLEXPEN) 100 UNIT/ML FlexPen   insulin NPH-regular Human (70-30) 100 UNIT/ML injection   Type 2 diabetes mellitus with stage 3 chronic kidney disease, with long-term current use of insulin (HCC) (Chronic)   Relevant Medications   metFORMIN (GLUCOPHAGE) 1000 MG tablet   glipiZIDE (GLUCOTROL) 10 MG tablet   insulin aspart (NOVOLOG FLEXPEN) 100 UNIT/ML FlexPen   insulin NPH-regular Human (70-30) 100 UNIT/ML injection   Other Relevant Orders   Urinalysis Dipstick (Completed)   POC Glucose (CBG) (Completed)   POC HgB A1c (Completed)     Musculoskeletal and  Integument   Gouty arthritis (Chronic)   Relevant Medications   predniSONE (DELTASONE) 10 MG tablet   allopurinol (ZYLOPRIM) 100 MG tablet   Colchicine (MITIGARE) 0.6 MG CAPS     Other   Anxiety   Relevant Medications   busPIRone (BUSPAR) 10 MG tablet   traZODone (DESYREL) 100 MG tablet   Dizziness   Relevant Medications   meclizine (ANTIVERT) 25 MG tablet   Hyperglycemia   Relevant Medications   metFORMIN (GLUCOPHAGE) 1000 MG tablet   glipiZIDE (GLUCOTROL) 10 MG tablet   Insomnia   Relevant Medications  traZODone (DESYREL) 100 MG tablet   Seasonal allergies   Relevant Medications   cetirizine (ZYRTEC) 10 MG tablet    Other Visit Diagnoses    Hospital discharge follow-up    -  Primary   Swelling of joint of right hand       Relevant Medications   predniSONE (DELTASONE) 10 MG tablet   Essential hypertension       Relevant Medications   sacubitril-valsartan (ENTRESTO) 97-103 MG   spironolactone (ALDACTONE) 25 MG tablet   torsemide (DEMADEX) 20 MG tablet   Class 3 severe obesity due to excess calories with serious comorbidity and body mass index (BMI) of 50.0 to 59.9 in adult [facility])       Relevant Medications   metFORMIN (GLUCOPHAGE) 1000 MG tablet   glipiZIDE (GLUCOTROL) 10 MG tablet   insulin aspart (NOVOLOG FLEXPEN) 100 UNIT/ML FlexPen   insulin NPH-regular Human (70-30) 100 UNIT/ML injection   Weight loss       Educated about 2019 novel coronavirus infection       Follow up       Abnormal urine finding       Relevant Orders   Urine Culture (Completed)   Chronic systolic congestive heart failure (HCC)       Relevant Medications   sacubitril-valsartan (ENTRESTO) 97-103 MG   spironolactone (ALDACTONE) 25 MG tablet   torsemide (DEMADEX) 20 MG tablet      Meds ordered this encounter  Medications  . busPIRone (BUSPAR) 10 MG tablet    Sig: Take 1 tablet (10 mg total) by mouth 2 (two) times daily.    Dispense:  60 tablet    Refill:  11  . metFORMIN (GLUCOPHAGE)  1000 MG tablet    Sig: Take 1 tablet (1,000 mg total) by mouth 2 (two) times daily with a meal.    Dispense:  60 tablet    Refill:  11  . predniSONE (DELTASONE) 10 MG tablet    Sig: Day #1: Take 6 tablets by mouth Day #2: Take 5 tablets by mouth Day #3: Take 4 tablets by mouth Day #4: Take 3 tablets by mouth Day #5: Take 2 tablets by mouth Day #6: Take 1 tablet, then complete.    Dispense:  21 tablet    Refill:  0  . allopurinol (ZYLOPRIM) 100 MG tablet    Sig: Take 1 tablet (100 mg total) by mouth daily.    Dispense:  90 tablet    Refill:  3  . cetirizine (ZYRTEC) 10 MG tablet    Sig: Take 1 tablet (10 mg total) by mouth daily.    Dispense:  90 tablet    Refill:  3  . Colchicine (MITIGARE) 0.6 MG CAPS    Sig: Take 1 tablet by mouth 2 (two) times daily as needed (gout).    Dispense:  30 capsule    Refill:  6  . gabapentin (NEURONTIN) 300 MG capsule    Sig: Take 1 capsule (300 mg total) by mouth 3 (three) times daily.    Dispense:  270 capsule    Refill:  3  . glipiZIDE (GLUCOTROL) 10 MG tablet    Sig: Take 1 tablet (10 mg total) by mouth 2 (two) times daily before a meal.    Dispense:  180 tablet    Refill:  3  . insulin aspart (NOVOLOG FLEXPEN) 100 UNIT/ML FlexPen    Sig: Inject 0-15 Units into the skin 3 (three) times daily with meals. CBG 121-150: 2u; CBG 151-200:  3u; CBG 201-250: 5u; CBG 251-300: 8u; CBG 301-350: 11u; CBG 351-400:15 u; CBG>400: 15u    Dispense:  45 mL    Refill:  3  . insulin NPH-regular Human (70-30) 100 UNIT/ML injection    Sig: Inject 38 Units into the skin 2 (two) times daily with a meal.    Dispense:  60 mL    Refill:  12  . meclizine (ANTIVERT) 25 MG tablet    Sig: Take 1 tablet (25 mg total) by mouth 3 (three) times daily.    Dispense:  90 tablet    Refill:  3  . sacubitril-valsartan (ENTRESTO) 97-103 MG    Sig: Take 1 tablet by mouth 2 (two) times daily.    Dispense:  180 tablet    Refill:  3    Please cancel all previous orders for  current medication. Change in dosage or pill size.  [name] spironolactone (ALDACTONE) 25 MG tablet    Sig: Take 0.5 tablets (12.5 mg total) by mouth daily.    Dispense:  45 tablet    Refill:  6  . torsemide (DEMADEX) 20 MG tablet    Sig: Take 2 tablets (40 mg total) by mouth daily.    Dispense:  60 tablet    Refill:  6  . traZODone (DESYREL) 100 MG tablet    Sig: Take 1 tablet (100 mg total) by mouth at bedtime. For sleep    Dispense:  90 tablet    Refill:  3    Follow-up: Return for Labs/OV.    [name], FNP

## 2020-06-24 NOTE — Patient Instructions (Signed)
RICE Therapy for Routine Care of Injuries Many injuries can be cared for with rest, ice, compression, and elevation (RICE therapy). This includes:  Resting the injured part.  Putting ice on the injury.  Putting pressure (compression) on the injury.  Raising the injured part (elevation). Using RICE therapy can help to lessen pain and swelling. Supplies needed:  Ice.  Plastic bag.  Towel.  Elastic bandage.  Pillow or pillows to raise (elevate) your injured body part. How to care for your injury with RICE therapy Rest Limit your normal activities, and try not to use the injured part of your body. You can go back to your normal activities when your doctor says it is okay to do them and you feel okay. Ask your doctor if you should do exercises to help your injury get better. Ice Put ice on the injured area. Do not put ice on your bare skin.  Put ice in a plastic bag.  Place a towel between your skin and the bag.  Leave the ice on for 20 minutes, 2-3 times a day. Use ice on as many days as told by your doctor.  Compression Compression means putting pressure on the injured area. This can be done with an elastic bandage. If an elastic bandage has been put on your injury:  Do not wrap the bandage too tight. Wrap the bandage more loosely if part of your body away from the bandage is blue, swollen, cold, painful, or loses feeling (gets numb).  Take off the bandage and put it on again. Do this every 3-4 hours or as told by your doctor.  See your doctor if the bandage seems to make your problems worse.  Elevation Elevation means keeping the injured area raised. If you can, raise the injured area above your heart or the center of your chest. Contact a doctor if:  You keep having pain and swelling.  Your symptoms get worse. Get help right away if:  You have sudden bad pain at your injury or lower than your injury.  You have redness or more swelling around your injury.  You  have tingling or numbness at your injury or lower than your injury, and it does not go away when you take off the bandage. Summary  Many injuries can be cared for using rest, ice, compression, and elevation (RICE therapy).  You can go back to your normal activities when you feel okay and your doctor says it is okay.  Put ice on the injured area as told by your doctor.  Get help if your symptoms get worse or if you keep having pain and swelling. This information is not intended to replace advice given to you by your health care provider. Make sure you discuss any questions you have with your health care provider. Document Revised: 06/11/2017 Document Reviewed: 06/11/2017 Elsevier Patient Education  2020 Elsevier Inc.  

## 2020-06-26 ENCOUNTER — Ambulatory Visit (HOSPITAL_COMMUNITY)
Admission: RE | Admit: 2020-06-26 | Discharge: 2020-06-26 | Disposition: A | Discharge status: Home / Self Care | Payer: Medicaid Other | Source: Ambulatory Visit | Attending: Internal Medicine | Admitting: Internal Medicine

## 2020-06-26 ENCOUNTER — Other Ambulatory Visit: Payer: Self-pay

## 2020-06-26 ENCOUNTER — Other Ambulatory Visit (HOSPITAL_COMMUNITY): Payer: Self-pay | Admitting: Internal Medicine

## 2020-06-26 DIAGNOSIS — Z87891 Personal history of nicotine dependence: Secondary | ICD-10-CM | POA: Diagnosis not present

## 2020-06-26 DIAGNOSIS — I130 Hypertensive heart and chronic kidney disease with heart failure and stage 1 through stage 4 chronic kidney disease, or unspecified chronic kidney disease: Secondary | ICD-10-CM | POA: Diagnosis not present

## 2020-06-26 DIAGNOSIS — E1122 Type 2 diabetes mellitus with diabetic chronic kidney disease: Secondary | ICD-10-CM | POA: Diagnosis not present

## 2020-06-26 DIAGNOSIS — N183 Chronic kidney disease, stage 3 unspecified: Secondary | ICD-10-CM | POA: Diagnosis not present

## 2020-06-26 DIAGNOSIS — Z6841 Body Mass Index (BMI) 40.0 and over, adult: Secondary | ICD-10-CM | POA: Insufficient documentation

## 2020-06-26 DIAGNOSIS — Z79899 Other long term (current) drug therapy: Secondary | ICD-10-CM | POA: Diagnosis not present

## 2020-06-26 DIAGNOSIS — I5022 Chronic systolic (congestive) heart failure: Secondary | ICD-10-CM | POA: Insufficient documentation

## 2020-06-26 DIAGNOSIS — Z7901 Long term (current) use of anticoagulants: Secondary | ICD-10-CM | POA: Diagnosis not present

## 2020-06-26 DIAGNOSIS — I472 Ventricular tachycardia: Secondary | ICD-10-CM | POA: Insufficient documentation

## 2020-06-26 DIAGNOSIS — G4733 Obstructive sleep apnea (adult) (pediatric): Secondary | ICD-10-CM | POA: Diagnosis not present

## 2020-06-26 DIAGNOSIS — M109 Gout, unspecified: Secondary | ICD-10-CM | POA: Insufficient documentation

## 2020-06-26 DIAGNOSIS — I480 Paroxysmal atrial fibrillation: Secondary | ICD-10-CM | POA: Diagnosis not present

## 2020-06-26 DIAGNOSIS — C641 Malignant neoplasm of right kidney, except renal pelvis: Secondary | ICD-10-CM | POA: Diagnosis not present

## 2020-06-26 DIAGNOSIS — I10 Essential (primary) hypertension: Secondary | ICD-10-CM

## 2020-06-26 LAB — URINE CULTURE: Organism ID, Bacteria: NO GROWTH

## 2020-06-26 MED ORDER — BIDIL 20-37.5 MG PO TABS: 1.5000 | ORAL_TABLET | Freq: Three times a day (TID) | ORAL | 6 refills | Status: DC

## 2020-06-26 MED ORDER — AMIODARONE HCL 100 MG PO TABS: 100.0000 mg | ORAL_TABLET | Freq: Every day | ORAL | 11 refills | Status: DC

## 2020-06-26 MED FILL — AMIODARONE HCL 100 MG TABS: 100 | 30 days supply | Qty: 30 | Fill #0

## 2020-06-26 NOTE — Patient Instructions (Signed)
It was a pleasure seeing you today!  MEDICATIONS: -We are changing your medications today -Increase Bidil to 1.5 tablets three times daily -Call if you have questions about your medications.    NEXT APPOINTMENT: Return to clinic in 4 weeks with Pharmacy Clinic.  In general, to take care of your heart failure: -Limit your fluid intake to 2 Liters (half-gallon) per day.   -Limit your salt intake to ideally 2-3 grams (2000-3000 mg) per day. -Weigh yourself daily and record, and bring that "weight diary" to your next appointment.  (Weight gain of 2-3 pounds in 1 day typically means fluid weight.) -The medications for your heart are to help your heart and help you live longer.   -Please contact us before stopping any of your heart medications.  Call the clinic at [phone] with questions or to reschedule future appointments.

## 2020-06-27 ENCOUNTER — Encounter: Payer: Self-pay | Admitting: Family Medicine

## 2020-06-27 ENCOUNTER — Other Ambulatory Visit: Payer: Self-pay | Admitting: Family Medicine

## 2020-06-27 MED ORDER — NOVOLOG FLEXPEN 100 UNIT/ML ~~LOC~~ SOPN: 0.0000 [IU] | PEN_INJECTOR | Freq: Three times a day (TID) | SUBCUTANEOUS | 3 refills | Status: DC

## 2020-06-27 MED ORDER — SACUBITRIL-VALSARTAN 97-103 MG PO TABS: 1.0000 | ORAL_TABLET | Freq: Two times a day (BID) | ORAL | 3 refills | Status: DC

## 2020-06-27 MED ORDER — CETIRIZINE HCL 10 MG PO TABS: 10.0000 mg | ORAL_TABLET | Freq: Every day | ORAL | 3 refills | Status: DC

## 2020-06-27 MED ORDER — TRAZODONE HCL 100 MG PO TABS: 100.0000 mg | ORAL_TABLET | Freq: Every day | ORAL | 3 refills | Status: DC

## 2020-06-27 MED ORDER — COLCHICINE 0.6 MG PO CAPS: 1.0000 | ORAL_CAPSULE | Freq: Two times a day (BID) | ORAL | 6 refills | Status: DC | PRN

## 2020-06-27 MED ORDER — TORSEMIDE 20 MG PO TABS: 40.0000 mg | ORAL_TABLET | Freq: Every day | ORAL | 6 refills | Status: DC

## 2020-06-27 MED ORDER — SPIRONOLACTONE 25 MG PO TABS: 12.5000 mg | ORAL_TABLET | Freq: Every day | ORAL | 6 refills | Status: DC

## 2020-06-27 MED ORDER — GABAPENTIN 300 MG PO CAPS
300.0000 mg | ORAL_CAPSULE | Freq: Three times a day (TID) | ORAL | 3 refills | Status: DC
  Filled 2021-03-07: qty 270, 90d supply, fill #0

## 2020-06-27 MED ORDER — MECLIZINE HCL 25 MG PO TABS: 25.0000 mg | ORAL_TABLET | Freq: Three times a day (TID) | ORAL | 3 refills | Status: DC

## 2020-06-27 MED ORDER — GLIPIZIDE 10 MG PO TABS: 10.0000 mg | ORAL_TABLET | Freq: Two times a day (BID) | ORAL | 3 refills | Status: DC

## 2020-06-27 MED ORDER — ALLOPURINOL 100 MG PO TABS: 100.0000 mg | ORAL_TABLET | Freq: Every day | ORAL | 3 refills | Status: DC

## 2020-06-27 MED ORDER — INSULIN NPH ISOPHANE & REGULAR (70-30) 100 UNIT/ML ~~LOC~~ SUSP: 38.0000 [IU] | Freq: Two times a day (BID) | SUBCUTANEOUS | 12 refills | Status: DC

## 2020-06-27 MED FILL — ENTRESTO 97 MG-103 MG TAB: 97-103 | 90 days supply | Qty: 180 | Fill #0

## 2020-06-27 MED FILL — glipiZIDE 10 MG TABS: 10 | 90 days supply | Qty: 180 | Fill #0

## 2020-06-27 MED FILL — CETIRIZINE HCL 10 MG TABS: 10 | 30 days supply | Qty: 30 | Fill #0

## 2020-06-27 MED FILL — TRAZODONE HCL 100 MG TABS: 100 | 90 days supply | Qty: 90 | Fill #0

## 2020-06-27 MED FILL — HUMULIN 70/30 VIAL: (70-30) 100 | 26 days supply | Qty: 20 | Fill #0

## 2020-06-27 MED FILL — TORSEMIDE 20 MG TABLET: 20 | 30 days supply | Qty: 60 | Fill #0

## 2020-06-27 MED FILL — ALLOPURINOL 100 MG TABLET: 100 | 90 days supply | Qty: 90 | Fill #0

## 2020-06-27 MED FILL — MITIGARE 0.6 MG CAPSULE: 0.6 | 30 days supply | Qty: 30 | Fill #0

## 2020-06-27 MED FILL — MECLIZINE 25 MG TABLET: 25 | 30 days supply | Qty: 90 | Fill #0

## 2020-06-27 MED FILL — GABAPENTIN 300 MG CAPSULE: 300 | 90 days supply | Qty: 270 | Fill #0

## 2020-07-24 ENCOUNTER — Inpatient Hospital Stay (HOSPITAL_COMMUNITY): Admission: RE | Admit: 2020-07-24 | Payer: Medicaid Other | Source: Ambulatory Visit

## 2020-07-24 ENCOUNTER — Other Ambulatory Visit: Payer: Medicaid Other

## 2020-07-24 NOTE — Patient Instructions (Signed)
Hi Jeffrey [name], sorry I missed you today - as a part of your Medicaid benefit, you are eligible for care management and care coordination services at no cost or copay. I was unable to reach you by phone today but would be happy to help you with your health related needs. Please feel free to call me at [phone]  A member of the Managed Medicaid care management team will reach out to you again over the next 7 days.   [name] RN, BSN [place]  [facility] Curator - Managed Medicaid High Risk [phone]

## 2020-07-24 NOTE — Patient Outreach (Signed)
Care Coordination  07/24/2020  Jeffrey [name] 25-Dec-1975 [id number]   An unsuccessful telephone outreach was attempted today. The patient was referred to the case management team for assistance with care management and care coordination.   Follow Up Plan: The Managed Medicaid care management team will reach out to the patient again over the next 7 days.   [name] RN, BSN [place]  [facility]ator - Managed Medicaid High Risk [phone]

## 2020-07-31 ENCOUNTER — Other Ambulatory Visit: Payer: Self-pay

## 2020-07-31 NOTE — Patient Outreach (Signed)
Care Coordination - Case Manager  07/31/2020  ANASTASIOS MELANDER [birth year] [id number]  Subjective:  Jeffrey [name] is an 44 y.o. year old male who is a primary patient of Jeffrey Glatter, FNP.  [name] was given information about Medicaid Managed Care team care coordination services today. Jeffrey [name] agreed to services and verbal consent obtained  Review of patient status, laboratory and other test data was performed as part of evaluation for provision of services.  SDOH: SDOH Screenings   Alcohol Screen:   . Last Alcohol Screening Score (AUDIT): Not on file  Depression (PHQ2-9): Low Risk   . PHQ-2 Score: 0  Financial Resource Strain:   . Difficulty of Paying Living Expenses: Not on file  Food Insecurity:   . Worried About Charity fundraiser in the Last Year: Not on file  . Ran Out of Food in the Last Year: Not on file  Housing:   . Last Housing Risk Score: Not on file  Physical Activity:   . Days of Exercise per Week: Not on file  . Minutes of Exercise per Session: Not on file  Social Connections:   . Frequency of Communication with Friends and Family: Not on file  . Frequency of Social Gatherings with Friends and Family: Not on file  . Attends Religious Services: Not on file  . Active Member of Clubs or Organizations: Not on file  . Attends Archivist Meetings: Not on file  . Marital Status: Not on file  Stress:   . Feeling of Stress : Not on file  Tobacco Use: Medium Risk  . Smoking Tobacco Use: Former Smoker  . Smokeless Tobacco Use: Never Used  Transportation Needs:   . Film/video editor (Medical): Not on file  . Lack of Transportation (Non-Medical): Not on file     Objective:    Allergies  Allergen Reactions  . Aspirin Shortness Of Breath, Itching and Rash     Burning sensation (Patient reports he tolerates other NSAIDS)   . Bee Venom Hives and Swelling  . Lisinopril Cough  . Tomato Rash    Medications:    Medications Reviewed  Today    Reviewed by Gayla Medicus, RN (Registered Nurse) on 07/31/20 at 20  Med List Status: <None>  Medication Order Taking? Sig Documenting Provider Last Dose Status Informant  Accu-Chek FastClix Lancets MISC [id number] Yes USE AS DIRECTED FOUR TIMES DAILY Jeffrey Glatter, FNP Taking Active Self  acetaminophen (TYLENOL) 325 MG tablet [id number] Yes Take 2 tablets (650 mg total) by mouth every 4 (four) hours as needed for mild pain, moderate pain, fever or headache. Shirley Friar, PA-C Taking Active   allopurinol (ZYLOPRIM) 100 MG tablet [id number] Yes Take 1 tablet (100 mg total) by mouth daily. Jeffrey Glatter, FNP Taking Active   amiodarone (PACERONE) 100 MG tablet [id number] Yes Take 1 tablet (100 mg total) by mouth daily. Bensimhon, Shaune Pascal, MD Taking Active   apixaban (ELIQUIS) 5 MG TABS tablet [id number] Yes Take 2 tablets (10 mg total) by mouth 2 (two) times daily for 7 days, THEN 1 tablet (5 mg total) 2 (two) times daily. Bensimhon, Shaune Pascal, MD Taking Active   blood glucose meter kit and supplies [id number] Yes Dispense based on patient and insurance preference. Use up to four times daily as directed. (FOR ICD-10 E10.9, E11.9). Jeffrey Glatter, FNP Taking Active Self  busPIRone (BUSPAR) 10 MG tablet [id number] Yes Take 1 tablet (10 mg total) by  mouth 2 (two) times daily. Jeffrey Glatter, FNP Taking Active   carvedilol (COREG) 6.25 MG tablet [id number] Yes Take 0.5 tablets (3.125 mg total) by mouth 2 (two) times daily. Bensimhon, Shaune Pascal, MD Taking Active   cetirizine (ZYRTEC) 10 MG tablet [id number] Yes Take 1 tablet (10 mg total) by mouth daily. Jeffrey Glatter, FNP Taking Active   Colchicine (MITIGARE) 0.6 MG CAPS [id number] Yes Take 1 tablet by mouth 2 (two) times daily as needed (gout). Jeffrey Glatter, FNP Taking Active   Continuous Blood Gluc Receiver (FREESTYLE LIBRE 14 DAY READER) DEVI [id number] Yes 1 each by Does not apply route 3 (three) times daily between meals  as needed. Jeffrey Glatter, FNP Taking Active Self  Continuous Blood Gluc Sensor (GUARDIAN SENSOR 3) MISC [id number]  1 Device by Does not apply route 3 (three) times daily between meals as needed. Jeffrey Glatter, FNP  Active Self  fluticasone [facility]) 50 MCG/ACT nasal spray [id number] No Place 2 sprays into both nostrils daily.  Patient not taking: Reported on 07/31/2020   Jeffrey Glatter, FNP Not Taking Active Self  gabapentin (NEURONTIN) 300 MG capsule [id number] Yes Take 1 capsule (300 mg total) by mouth 3 (three) times daily. Jeffrey Glatter, FNP Taking Active   glipiZIDE (GLUCOTROL) 10 MG tablet [id number] Yes Take 1 tablet (10 mg total) by mouth 2 (two) times daily before a meal. Jeffrey Glatter, FNP Taking Active   glucose blood (ACCU-CHEK GUIDE) test strip [id number] Yes USE AS DIRECTED FOUR TIMES DAILY Jeffrey Glatter, FNP Taking Active Self  insulin aspart (NOVOLOG FLEXPEN) 100 UNIT/ML FlexPen [id number] Yes Inject 0-15 Units into the skin 3 (three) times daily with meals. CBG 121-150: 2u; CBG 151-200: 3u; CBG 201-250: 5u; CBG 251-300: 8u; CBG 301-350: 11u; CBG 351-400:15 u; CBG>400: 15u Jeffrey Glatter, FNP Taking Active   insulin NPH-regular Human (70-30) 100 UNIT/ML injection [id number] Yes Inject 38 Units into the skin 2 (two) times daily with a meal. Jeffrey Glatter, FNP Taking Active   Insulin Pen Needle (TRUEPLUS 5-BEVEL PEN NEEDLES) 31G X 6 MM MISC [id number] Yes 1 Syringe by Does not apply route as needed. Jeffrey Glatter, FNP Taking Active Self  Insulin Syringe-Needle U-100 (TRUEPLUS INSULIN SYRINGE) 31G X 5/16" 0.5 ML MISC [id number] Yes Use to inject insulin twice daily. Charlott Rakes, MD Taking Active Self  isosorbide-hydrALAZINE (BIDIL) 20-37.5 MG tablet [id number] Yes Take 1.5 tablets by mouth 3 (three) times daily. Bensimhon, Shaune Pascal, MD Taking Active   Magnesium Oxide 200 MG TABS [id number] Yes Take 100 mg by mouth daily. [provider] Taking Active  Self  meclizine (ANTIVERT) 25 MG tablet [id number] Yes Take 1 tablet (25 mg total) by mouth 3 (three) times daily. Jeffrey Glatter, FNP Taking Active   metFORMIN (GLUCOPHAGE) 1000 MG tablet [id number] Yes Take 1 tablet (1,000 mg total) by mouth 2 (two) times daily with a meal. Jeffrey Glatter, FNP Taking Active   predniSONE (DELTASONE) 10 MG tablet [id number] No Day #1: Take 6 tablets by mouth Day #2: Take 5 tablets by mouth Day #3: Take 4 tablets by mouth Day #4: Take 3 tablets by mouth Day #5: Take 2 tablets by mouth Day #6: Take 1 tablet, then complete.  Patient not taking: Reported on 07/31/2020   Jeffrey Glatter, FNP Not Taking Active   sacubitril-valsartan [facility]) 97-103 MG [id number] Yes Take 1 tablet by mouth 2 (two) times daily. Jeffrey Glatter, FNP Taking Active  spironolactone (ALDACTONE) 25 MG tablet [id number] Yes Take 0.5 tablets (12.5 mg total) by mouth daily. Jeffrey Glatter, FNP Taking Active   torsemide (DEMADEX) 20 MG tablet [id number] Yes Take 2 tablets (40 mg total) by mouth daily. Jeffrey Glatter, FNP Taking Active   traZODone (DESYREL) 100 MG tablet [id number] Yes Take 1 tablet (100 mg total) by mouth at bedtime. For sleep Jeffrey Glatter, FNP Taking Active   Vitamin D, Ergocalciferol, (DRISDOL) 1.25 MG (50000 UT) CAPS capsule [id number] Yes Take 1 capsule (50,000 Units total) by mouth once a week. Jeffrey Glatter, FNP Taking Active Self           Med Note Owens Shark, JASMINE Q   Wed May 29, 2020  3:02 PM)            Plan: Patient states no concerns/issues-feeling well.  Will follow up within 30 days.

## 2020-07-31 NOTE — Patient Instructions (Signed)
Hi [name] you for speaking with me today.  [name] was given information about Medicaid Managed Care team care coordination services as a part of their Healthy [facility] Medicaid benefit. [name] verbally consented to engagement with the [facility] Managed Care team.   For questions related to your Healthy [facility] health plan, please call: [phone] or visit the homepage here: [email or web address]  If you would like to schedule transportation through your Healthy [facility] plan, please call the following number at least 2 days in advance of your appointment: [phone]   The Managed Medicaid care management team will reach out to the patient again over the next 30 days.   [name] RN, BSN [place]  [facility] Curator - Managed Medicaid High Risk [phone]

## 2020-08-05 MED FILL — SPIRONOLACTONE 25 MG TABLET: 25 | 90 days supply | Qty: 45 | Fill #0

## 2020-08-05 MED FILL — busPIRone HCL 10 MG TABS: 10 | 30 days supply | Qty: 60 | Fill #1

## 2020-08-05 MED FILL — AMIODARONE HCL 100 MG TABS: 100 | 30 days supply | Qty: 30 | Fill #1

## 2020-08-07 ENCOUNTER — Other Ambulatory Visit: Payer: Self-pay

## 2020-08-07 ENCOUNTER — Ambulatory Visit (HOSPITAL_COMMUNITY)
Admission: RE | Admit: 2020-08-07 | Discharge: 2020-08-07 | Disposition: A | Discharge status: Home / Self Care | Payer: Medicaid Other | Source: Ambulatory Visit | Attending: Pharmacist | Admitting: Pharmacist

## 2020-08-07 ENCOUNTER — Other Ambulatory Visit (HOSPITAL_COMMUNITY): Payer: Self-pay | Admitting: Internal Medicine

## 2020-08-07 DIAGNOSIS — I428 Other cardiomyopathies: Secondary | ICD-10-CM | POA: Diagnosis not present

## 2020-08-07 DIAGNOSIS — M109 Gout, unspecified: Secondary | ICD-10-CM | POA: Diagnosis not present

## 2020-08-07 DIAGNOSIS — Z7901 Long term (current) use of anticoagulants: Secondary | ICD-10-CM | POA: Insufficient documentation

## 2020-08-07 DIAGNOSIS — Z79899 Other long term (current) drug therapy: Secondary | ICD-10-CM | POA: Diagnosis not present

## 2020-08-07 DIAGNOSIS — Z6841 Body Mass Index (BMI) 40.0 and over, adult: Secondary | ICD-10-CM | POA: Insufficient documentation

## 2020-08-07 DIAGNOSIS — Z8553 Personal history of malignant neoplasm of renal pelvis: Secondary | ICD-10-CM | POA: Diagnosis not present

## 2020-08-07 DIAGNOSIS — I5022 Chronic systolic (congestive) heart failure: Secondary | ICD-10-CM | POA: Diagnosis present

## 2020-08-07 DIAGNOSIS — I130 Hypertensive heart and chronic kidney disease with heart failure and stage 1 through stage 4 chronic kidney disease, or unspecified chronic kidney disease: Secondary | ICD-10-CM | POA: Diagnosis not present

## 2020-08-07 DIAGNOSIS — Z95810 Presence of automatic (implantable) cardiac defibrillator: Secondary | ICD-10-CM | POA: Insufficient documentation

## 2020-08-07 DIAGNOSIS — N183 Chronic kidney disease, stage 3 unspecified: Secondary | ICD-10-CM | POA: Diagnosis not present

## 2020-08-07 DIAGNOSIS — E1122 Type 2 diabetes mellitus with diabetic chronic kidney disease: Secondary | ICD-10-CM | POA: Insufficient documentation

## 2020-08-07 DIAGNOSIS — I10 Essential (primary) hypertension: Secondary | ICD-10-CM

## 2020-08-07 DIAGNOSIS — G4733 Obstructive sleep apnea (adult) (pediatric): Secondary | ICD-10-CM | POA: Insufficient documentation

## 2020-08-07 DIAGNOSIS — Z905 Acquired absence of kidney: Secondary | ICD-10-CM | POA: Diagnosis not present

## 2020-08-07 DIAGNOSIS — I480 Paroxysmal atrial fibrillation: Secondary | ICD-10-CM | POA: Diagnosis not present

## 2020-08-07 MED ORDER — BIDIL 20-37.5 MG PO TABS: 2.0000 | ORAL_TABLET | Freq: Three times a day (TID) | ORAL | 11 refills | Status: DC

## 2020-08-07 MED ORDER — CARVEDILOL 6.25 MG PO TABS: 6.2500 mg | ORAL_TABLET | Freq: Two times a day (BID) | ORAL | 11 refills | Status: DC

## 2020-08-07 MED FILL — BIDIL 20-37.5 MG TABS: 20-37.5 | 30 days supply | Qty: 180 | Fill #0

## 2020-08-07 MED FILL — CARVEDILOL 6.25 MG TABLET: 6.25 | 15 days supply | Qty: 30 | Fill #0

## 2020-08-07 NOTE — Patient Instructions (Signed)
It was a pleasure seeing you today!  MEDICATIONS: -We are changing your medications today -Increase carvedilol to 6.25 mg (1 tablet) twice daily -Increase Bidil to 2 tablets three times daily -Call if you have questions about your medications.   NEXT APPOINTMENT: Return to clinic in 1 month with Dr. [name].  In general, to take care of your heart failure: -Limit your fluid intake to 2 Liters (half-gallon) per day.   -Limit your salt intake to ideally 2-3 grams (2000-3000 mg) per day. -Weigh yourself daily and record, and bring that "weight diary" to your next appointment.  (Weight gain of 2-3 pounds in 1 day typically means fluid weight.) -The medications for your heart are to help your heart and help you live longer.   -Please contact us before stopping any of your heart medications.  Call the clinic at [phone] with questions or to reschedule future appointments.

## 2020-08-07 NOTE — Progress Notes (Signed)
Primary Care Provider:[place], Ellie Lunch, FNP Primary Cardiologist:P. Harrington Challenger, MD  Primary HF Cardiologist: Dr. 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   Admitted to [facility] in 3/16 secondary to atypical chest pain, dyspnea, and mild troponin elevation. Echo during hospitalization shows "severely reduced LV function" - EF not quantified due to poor windows.  Admitted 05/14/15 with CP at rest. [facility] showed normal coronaries and moderately elevated filling pressures with moderately depressed cardiac output. [place] subcutaneous ICD was implanted  Admitted 9/4 - 03/11/58 with A/C systolic CHF in the setting of new afib. Started on IV furosemide and amiodarone. Pt diuresed and converted to NSR without requiring DCCV. Started on Eliquis for [facility]. Continued on amiodarone on discharge with wean over several weeks.  Admitted 3/27 - 01/01/18 with ICD shock. Electrolytes stable. Echo 12/31/17 with LVEF 25-30%.  Pt admitted 05/15/20 for S-ICD generator change. Course was complicated with acute hypoxic/hypercapnic respiratory failure due to possible flash pulmonary edema. Had traumatic intubation resulting in aspiration and requiring ventilator support and short term pressor support. On 05/16/20 pt stable on vent, off pressors. Pt generator change completed. Covered with ABX with concern for aspiration PNA with plan for Augmentin for 5 day course on discharge.  Presented forpost-hospitalfollow up on 05/29/20 with Dr. [name]. Was feeling good. Reported walking 2.5 miles per day. Denied SOB, edema, orthopnea or PND.At this visit with MD, amiodarone was decreased to 100 mg daily and carvedilol was increased to 6.25 mg BID.   Recently presented to HF clinic on 06/26/20 for pharmacist medication titration. Unfortunately, the  correct dose was not sent to the pharmacy during his last visit, so he was still taking carvedilol 3.125 mg BID. Overall he was feeling well. No dizziness, lightheadedness, chest pain or palpitations. No SOB/DOE. He was taking torsemide 40 mg daily and had not needed any extra. His weight was stable at home at 320-325 lbs. No LEE, PND or orthopnea. Appetite was good. Followed a low salt diet. Was started on a prednisone taper by his PCP which he has now completed.  Today he returns to HF clinic for pharmacist medication titration. At last visit with pharmacist, Bidil was increased to 1.5 tablets TID. Today he reports feeling well overall. He denies dizziness, lightheadedness, or fatigue. He reports his SBP has been fairly normal in 120s at home. Does not have chest pain or palpitations. He states his breathing has been fine and he can walk 1.5 miles before feeling very SOB. He uses his CPAP every night. Weight has been stable around 325-338 lbs. He is able to tell when he is volume overloaded and denies any LEE or feeling fluid overloaded. Continues to use 2 pillows at night and does not wake up feeling SOB. Follows low-salt diet.  HF Medications: Carvedilol 3.125 mg BID Entresto 97/103 mg BID Spironolactone 12.5 mg daily Bidil 20/37.5 mg 1.5 tablet TID Torsemide 40 mg daily  Has the patient been experiencing any side effects to the medications prescribed?  no  Does the patient have any problems obtaining medications due to transportation or finances?   No - Has BCBS Medicaid  Understanding of regimen: good Understanding of indications: good Potential of compliance: good Patient understands to avoid NSAIDs. Patient understands to avoid decongestants.    Pertinent Lab Values:  05/17/20: Serum creatinine 1.54, BUN 25, Potassium 4.1, Sodium 139,  Magnesium 1.9   Vital Signs:  Weight: 328 lb (last clinic weight: 326 lb)  Blood pressure: 120/86   Heart rate: 70   Assessment: 1. Chronic  systolic congestive heart failure: s/p [employer] subcutaneous ICD implant 05/17/15(gen change 8/21 - c/b traumatic intubation). 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  - Echo 12/31/17 LVEF 25-30%, Moderate MR, mild RV dilation, Severe RAE, Mod TR, Pa peak pressure 67 mm Hg.  - NYHA II, euvolemic on exam - Continue torsemide 40 mg daily  -Increase carvedilol to 6.25 mg BID.  - Continue Entresto 97/103 mg BID. Watch closely with solitary kidney.  - Continue spironolactone 12.5 mg daily - Increase Bidil to 20-37.5 mg 2 tablets TID  - Was asked for him to speak with PCP re addition of Farxiga, but he forgot at his most recent visit. 2. Paroxysmal Afib - New diagnosis 06/2017. - Maintaining NSR - Continue Eliquis 5 mg BID.  -Continue amiodarone 100 daily.   - Will need yearly eye exams 3. 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  No syncope or palpitations. 4. Hypertension:  - Stable.  5. CKD stage III -creatinine1.5-1.7.  6. OSA:  - Back on CPAP.  7. Tobacco use: - Former smoker. No change.  8. Gout:  - Continue allopurinol 200 daily for prophylaxis. No change.  9. DM2:  - Stable. Per PCP. 10.Renal Cell Carcinoma - S/P nephrectomy 2014.  Follow closely in setting of solitary kidney.  - Follows at [facility] and has follow up next month.  11. Morbid Obesity -Body mass index is 51.41 kg/m.   Plan: 1) Medication changes: Based on clinical presentation, vital signs and recent labs will increase carvedilol to 6.25 mg BID and increase Bidil to 20-37.5 mg 2 tablets TID 2) Follow-up: 6-week appointment with Dr. [name] on 09/16/20    Audry Riles, PharmD, BCPS, BCCP, CPP Heart Failure Clinic Pharmacist [phone]

## 2020-08-15 MED FILL — METFORMIN HCL 1000 MG TABS: 1000 | 30 days supply | Qty: 60 | Fill #1

## 2020-08-21 ENCOUNTER — Ambulatory Visit (INDEPENDENT_AMBULATORY_CARE_PROVIDER_SITE_OTHER): Payer: Medicaid Other

## 2020-08-21 DIAGNOSIS — I428 Other cardiomyopathies: Secondary | ICD-10-CM

## 2020-08-21 LAB — CUP PACEART REMOTE DEVICE CHECK
Battery Remaining Percentage: 99 %
Date Time Interrogation Session: 20211117092800
Implantable Lead Implant Date: 20160812
Implantable Lead Location: 753862
Implantable Lead Model: 3401
Implantable Pulse Generator Implant Date: 20210812
Pulse Gen Serial Number: 139514

## 2020-08-22 NOTE — Progress Notes (Signed)
Remote ICD transmission.   

## 2020-08-23 ENCOUNTER — Other Ambulatory Visit: Payer: Self-pay

## 2020-08-27 ENCOUNTER — Encounter: Payer: Medicaid Other | Admitting: Internal Medicine

## 2020-08-27 DIAGNOSIS — I428 Other cardiomyopathies: Secondary | ICD-10-CM

## 2020-08-27 DIAGNOSIS — I5022 Chronic systolic (congestive) heart failure: Secondary | ICD-10-CM

## 2020-08-27 DIAGNOSIS — I480 Paroxysmal atrial fibrillation: Secondary | ICD-10-CM

## 2020-08-27 DIAGNOSIS — I472 Ventricular tachycardia: Secondary | ICD-10-CM

## 2020-08-28 ENCOUNTER — Other Ambulatory Visit: Payer: Self-pay | Admitting: Obstetrics and Gynecology

## 2020-08-28 ENCOUNTER — Other Ambulatory Visit: Payer: Self-pay

## 2020-08-28 NOTE — Patient Instructions (Signed)
Hi [name] you for speaking with me today.  [name] was given information about Medicaid Managed Care team care coordination services as a part of their Healthy [facility] Medicaid benefit. Jeffrey [name] verbally consented to engagement with the [facility] Managed Care team.   For questions related to your Healthy [facility] health plan, please call: [phone] or visit the homepage here: [email or web address]  If you would like to schedule transportation through your Healthy [facility] plan, please call the following number at least 2 days in advance of your appointment: [phone]   Patient verbalizes understanding of instructions provided today.   RN Care Manager will follow up with patient within 30 days. The patient has been provided with contact information for the Managed Medicaid care management team and has been advised to call with any health related questions or concerns.   [name] RN, BSN [place]  [facility] Curator - Managed Medicaid High Risk [phone].

## 2020-08-28 NOTE — Patient Outreach (Signed)
Care Coordination - Case Manager  08/28/2020  SHAWON DENZER 10/11/1975 [id number]  Subjective:  TOMAZ JANIS is an 44 y.o. year old male who is a primary patient of [name], FNP.  Mr. Cropper was given information about Medicaid Managed Care team care coordination services today. [name] agreed to services and verbal consent obtained  Review of patient status, laboratory and other test data was performed as part of evaluation for provision of services.  SDOH: SDOH Screenings   Alcohol Screen:   . Last Alcohol Screening Score (AUDIT): Not on file  Depression (PHQ2-9): Low Risk   . PHQ-2 Score: 0  Financial Resource Strain:   . Difficulty of Paying Living Expenses: Not on file  Food Insecurity:   . Worried About Charity fundraiser in the Last Year: Not on file  . Ran Out of Food in the Last Year: Not on file  Housing:   . Last Housing Risk Score: Not on file  Physical Activity:   . Days of Exercise per Week: Not on file  . Minutes of Exercise per Session: Not on file  Social Connections:   . Frequency of Communication with Friends and Family: Not on file  . Frequency of Social Gatherings with Friends and Family: Not on file  . Attends Religious Services: Not on file  . Active Member of Clubs or Organizations: Not on file  . Attends Archivist Meetings: Not on file  . Marital Status: Not on file  Stress:   . Feeling of Stress : Not on file  Tobacco Use: Medium Risk  . Smoking Tobacco Use: Former Smoker  . Smokeless Tobacco Use: Never Used  Transportation Needs:   . Film/video editor (Medical): Not on file  . Lack of Transportation (Non-Medical): Not on file     Objective:    Allergies  Allergen Reactions  . Aspirin Shortness Of Breath, Itching and Rash     Burning sensation (Patient reports he tolerates other NSAIDS)   . Bee Venom Hives and Swelling  . Lisinopril Cough  . Tomato Rash    Medications:    Medications Reviewed  Today    Reviewed by Gayla Medicus, RN (Registered Nurse) on 08/28/20 at [place] List Status: <None>  Medication Order Taking? Sig Documenting Provider Last Dose Status Informant  Accu-Chek FastClix Lancets MISC [id number] Yes USE AS DIRECTED FOUR TIMES DAILY [name], FNP Taking Active Self  acetaminophen (TYLENOL) 325 MG tablet [id number] Yes Take 2 tablets (650 mg total) by mouth every 4 (four) hours as needed for mild pain, moderate pain, fever or headache. Shirley Friar, PA-C Taking Active   allopurinol (ZYLOPRIM) 100 MG tablet [id number] Yes Take 1 tablet (100 mg total) by mouth daily. [name], FNP Taking Active   amiodarone (PACERONE) 100 MG tablet [id number] Yes Take 1 tablet (100 mg total) by mouth daily. Bensimhon, Shaune Pascal, MD Taking Active   apixaban (ELIQUIS) 5 MG TABS tablet [id number] Yes Take 2 tablets (10 mg total) by mouth 2 (two) times daily for 7 days, THEN 1 tablet (5 mg total) 2 (two) times daily. Bensimhon, Shaune Pascal, MD Taking Active   blood glucose meter kit and supplies [id number] Yes Dispense based on patient and insurance preference. Use up to four times daily as directed. (FOR ICD-10 E10.9, E11.9). [name], FNP Taking Active Self  busPIRone (BUSPAR) 10 MG tablet [id number] Yes Take 1 tablet (10 mg total) by  mouth 2 (two) times daily. [name], FNP Taking Active   carvedilol (COREG) 6.25 MG tablet [id number] Yes Take 1 tablet (6.25 mg total) by mouth 2 (two) times daily. Bensimhon, Shaune Pascal, MD Taking Active   cetirizine (ZYRTEC) 10 MG tablet [id number] Yes Take 1 tablet (10 mg total) by mouth daily. [name], FNP Taking Active   Colchicine (MITIGARE) 0.6 MG CAPS [id number] Yes Take 1 tablet by mouth 2 (two) times daily as needed (gout). [name], FNP Taking Active   Continuous Blood Gluc Receiver (FREESTYLE LIBRE 14 DAY READER) DEVI [id number] Yes 1 each by Does not apply route 3 (three) times daily between meals as  needed. [name], FNP Taking Active Self  Continuous Blood Gluc Sensor (GUARDIAN SENSOR 3) MISC [id number] Yes 1 Device by Does not apply route 3 (three) times daily between meals as needed. [name], FNP Taking Active Self  fluticasone (FLONASE) 50 MCG/ACT nasal spray [id number] No Place 2 sprays into both nostrils daily.  Patient not taking: Reported on 07/31/2020   [name], FNP Not Taking Active Self  gabapentin (NEURONTIN) 300 MG capsule [id number] Yes Take 1 capsule (300 mg total) by mouth 3 (three) times daily. [name], FNP Taking Active   glipiZIDE (GLUCOTROL) 10 MG tablet [id number] Yes Take 1 tablet (10 mg total) by mouth 2 (two) times daily before a meal. [name], FNP Taking Active   glucose blood (ACCU-CHEK GUIDE) test strip [id number] Yes USE AS DIRECTED FOUR TIMES DAILY [name], FNP Taking Active Self  insulin aspart (NOVOLOG FLEXPEN) 100 UNIT/ML FlexPen [id number] Yes Inject 0-15 Units into the skin 3 (three) times daily with meals. CBG 121-150: 2u; CBG 151-200: 3u; CBG 201-250: 5u; CBG 251-300: 8u; CBG 301-350: 11u; CBG 351-400:15 u; CBG>400: 15u [name], FNP Taking Active   insulin NPH-regular Human (70-30) 100 UNIT/ML injection [id number] Yes Inject 38 Units into the skin 2 (two) times daily with a meal. [name], FNP Taking Active   Insulin Pen Needle (TRUEPLUS 5-BEVEL PEN NEEDLES) 31G X 6 MM MISC [id number] Yes 1 Syringe by Does not apply route as needed. [name], FNP Taking Active Self  Insulin Syringe-Needle U-100 (TRUEPLUS INSULIN SYRINGE) 31G X 5/16" 0.5 ML MISC [id number] Yes Use to inject insulin twice daily. Charlott Rakes, MD Taking Active Self  isosorbide-hydrALAZINE (BIDIL) 20-37.5 MG tablet [id number] Yes Take 2 tablets by mouth 3 (three) times daily. Bensimhon, Shaune Pascal, MD Taking Active   Magnesium Oxide 200 MG TABS [id number] Yes Take 100 mg by mouth daily. [provider] Taking Active  Self  meclizine (ANTIVERT) 25 MG tablet [id number] Yes Take 1 tablet (25 mg total) by mouth 3 (three) times daily. [name], FNP Taking Active   metFORMIN (GLUCOPHAGE) 1000 MG tablet [id number] Yes Take 1 tablet (1,000 mg total) by mouth 2 (two) times daily with a meal. [name], FNP Taking Active   sacubitril-valsartan (ENTRESTO) 97-103 MG [id number] Yes Take 1 tablet by mouth 2 (two) times daily. [name], FNP Taking Active   spironolactone (ALDACTONE) 25 MG tablet [id number] Yes Take 0.5 tablets (12.5 mg total) by mouth daily. [name], FNP Taking Active   torsemide (DEMADEX) 20 MG tablet [id number] Yes Take 2 tablets (40 mg total) by mouth daily. [name], FNP Taking Active   traZODone (DESYREL) 100 MG tablet [id number] Yes Take 1 tablet (100 mg total) by mouth at bedtime. For  sleep [name], FNP Taking Active   Vitamin D, Ergocalciferol, (DRISDOL) 1.25 MG (50000 UT) CAPS capsule [id number] Yes Take 1 capsule (50,000 Units total) by mouth once a week. [name], FNP Taking Active Self           Med Note Owens Shark, JASMINE Q   Wed May 29, 2020  3:02 PM)            Plan: RNCM will follow up with patient within 30 days.  Patient with no issues or complaints today.  Per patient, blood pressures 120/80, and blood glucose 100-120.

## 2020-09-04 ENCOUNTER — Encounter: Payer: Self-pay | Admitting: Student

## 2020-09-04 ENCOUNTER — Other Ambulatory Visit: Payer: Self-pay

## 2020-09-04 ENCOUNTER — Ambulatory Visit (INDEPENDENT_AMBULATORY_CARE_PROVIDER_SITE_OTHER): Payer: Medicaid Other | Admitting: Student

## 2020-09-04 VITALS — BP 114/80 | HR 100 | Ht 67.0 in | Wt 330.2 lb

## 2020-09-04 DIAGNOSIS — I5022 Chronic systolic (congestive) heart failure: Secondary | ICD-10-CM

## 2020-09-04 DIAGNOSIS — Z95810 Presence of automatic (implantable) cardiac defibrillator: Secondary | ICD-10-CM

## 2020-09-04 DIAGNOSIS — I428 Other cardiomyopathies: Secondary | ICD-10-CM

## 2020-09-04 DIAGNOSIS — I480 Paroxysmal atrial fibrillation: Secondary | ICD-10-CM | POA: Diagnosis not present

## 2020-09-04 LAB — COMPREHENSIVE METABOLIC PANEL
ALT: 23 IU/L (ref 0–44)
AST: 17 IU/L (ref 0–40)
Albumin/Globulin Ratio: 1.3 (ref 1.2–2.2)
Albumin: 4.1 g/dL (ref 4.0–5.0)
Alkaline Phosphatase: 74 IU/L (ref 44–121)
BUN/Creatinine Ratio: 16 (ref 9–20)
BUN: 23 mg/dL (ref 6–24)
Bilirubin Total: 0.2 mg/dL (ref 0.0–1.2)
CO2: 20 mmol/L (ref 20–29)
Calcium: 9.6 mg/dL (ref 8.7–10.2)
Chloride: 103 mmol/L (ref 96–106)
Creatinine, Ser: 1.46 mg/dL — ABNORMAL HIGH (ref 0.76–1.27)
GFR calc Af Amer: 66 mL/min/{1.73_m2} (ref >59)
GFR calc non Af Amer: 57 mL/min/{1.73_m2} — ABNORMAL LOW (ref >59)
Globulin, Total: 3.1 g/dL (ref 1.5–4.5)
Glucose: 273 mg/dL — ABNORMAL HIGH (ref 65–99)
Potassium: 4.2 mmol/L (ref 3.5–5.2)
Sodium: 138 mmol/L (ref 134–144)
Total Protein: 7.2 g/dL (ref 6.0–8.5)

## 2020-09-04 LAB — CBC WITH DIFFERENTIAL/PLATELET
Basophils Absolute: 0.1 10*3/uL (ref 0.0–0.2)
Basos: 1 %
EOS (ABSOLUTE): 0.2 10*3/uL (ref 0.0–0.4)
Eos: 2 %
Hematocrit: 41.2 % (ref 37.5–51.0)
Hemoglobin: 13.2 g/dL (ref 13.0–17.7)
Immature Grans (Abs): 0 10*3/uL (ref 0.0–0.1)
Immature Granulocytes: 0 %
Lymphocytes Absolute: 2.5 10*3/uL (ref 0.7–3.1)
Lymphs: 31 %
MCH: 26 pg — ABNORMAL LOW (ref 26.6–33.0)
MCHC: 32 g/dL (ref 31.5–35.7)
MCV: 81 fL (ref 79–97)
Monocytes Absolute: 0.5 10*3/uL (ref 0.1–0.9)
Monocytes: 7 %
Neutrophils Absolute: 4.7 10*3/uL (ref 1.4–7.0)
Neutrophils: 59 %
Platelets: 308 10*3/uL (ref 150–450)
RBC: 5.08 x10E6/uL (ref 4.14–5.80)
RDW: 14.7 % (ref 11.6–15.4)
WBC: 8 10*3/uL (ref 3.4–10.8)

## 2020-09-04 LAB — TSH: TSH: 2.98 u[IU]/mL (ref 0.450–4.500)

## 2020-09-04 LAB — CUP PACEART INCLINIC DEVICE CHECK
Date Time Interrogation Session: 20211201100912
Implantable Lead Implant Date: 20160812
Implantable Lead Location: 753862
Implantable Lead Model: 3401
Implantable Pulse Generator Implant Date: 20210812
Pulse Gen Serial Number: 139514

## 2020-09-04 NOTE — Progress Notes (Signed)
Electrophysiology Office Note Date: 09/04/2020  ID:  Jeffrey, [name] 03-15-1976, MRN [id number]  PCP: [name], FNP Primary Cardiologist: Glori Bickers, MD Electrophysiologist: Virl Axe, MD   CC: Routine ICD follow-up  Jeffrey [name] is a 44 y.o. male seen today for Virl Axe, MD for routine electrophysiology followup.  Since last being seen in our clinic the patient reports doing very well.  he denies chest pain, palpitations, dyspnea, PND, orthopnea, nausea, vomiting, dizziness, syncope, edema, weight gain, or early satiety. He has not had ICD shocks.   Device History: Environmental education officer ICD implanted 2016, gen change 2021 for NICM History of AAD therapy: Yes; currently on amiodarone   Past Medical History:  Diagnosis Date  . Anxiety   . Aspirin allergy   . Childhood asthma   . Chronic systolic CHF (congestive heart failure) ([place])    a. EF 20-25% in 2012. b. EF 45-50% in 10/2011 with nonischemic nuc - presumed NICM. c. 12/2014 Echo: Sev depressed LV fxn, sev dil LV, mild LVH, mild MR, sev dil LA, mildly reduced RV fxn.  . CKD (chronic kidney disease) stage 2, GFR 60-89 ml/min   . H/O vasectomy 12/2019  . High cholesterol   . Hypertension   . Morbid obesity ([place])   . Nephrolithiasis   . OSA on CPAP   . Presumed NICM    a. 04/2014 Myoview: EF 26%, glob HK, sev glob HK, ? prior infarct;  b. Never cathed 2/2 CKD.  [name] Renal cell carcinoma ([place])    a. s/p Rt robotic assisted partial converted to radical nephrectomy on 01/2013.  . Troponin level elevated    a. 04/2014, 12/2014: felt due to CHF.  . Type II diabetes mellitus ([place])   . Ventricular tachycardia ([place])    a. appropriate ICD therapy 12/2017   Past Surgical History:  Procedure Laterality Date  . APPENDECTOMY  07/2004  . CARDIAC CATHETERIZATION N/A 05/17/2015   Procedure: Right/Left Heart Cath and Coronary Angiography;  Surgeon: Jolaine Artist, MD;  Location: [facility];  Service:  Cardiovascular;  Laterality: N/A;  . EP IMPLANTABLE DEVICE N/A 05/17/2015   Procedure: SubQ ICD Implant;  Surgeon: Will Meredith Leeds, MD;  Location: [facility];  Service: Cardiovascular;  Laterality: N/A;  . ROBOTIC ASSISTED LAPAROSCOPIC LYSIS OF ADHESION  01/13/2013   Procedure: ROBOTIC ASSISTED LAPAROSCOPIC LYSIS OF ADHESION EXTENSIVE;  Surgeon: Alexis Frock, MD;  Location: [facility];  Service: Urology;;  . ROBOTIC ASSITED PARTIAL NEPHRECTOMY Right 01/13/2013   Procedure: ROBOTIC ASSITED PARTIAL NEPHRECTOMY CONVERTED TO ROBOTIC ASSISTED RIGHT RADICAL NEPHRECTOMY;  Surgeon: Alexis Frock, MD;  Location: [facility];  Service: Urology;  Laterality: Right;  . SUBQ ICD CHANGEOUT N/A 05/16/2020   Procedure: SUBQ ICD CHANGEOUT;  Surgeon: [name], MD;  Location: [facility];  Service: Cardiovascular;  Laterality: N/A;  . SUBQ ICD CHANGEOUT N/A 05/15/2020   Procedure: PQAE ICD CHANGEOUT;  Surgeon: [name], MD;  Location: [facility];  Service: Cardiovascular;  Laterality: N/A;  . VASECTOMY      Current Outpatient Medications  Medication Sig Dispense Refill  . Accu-Chek FastClix Lancets MISC USE AS DIRECTED FOUR TIMES DAILY 102 each 11  . acetaminophen (TYLENOL) 325 MG tablet Take 2 tablets (650 mg total) by mouth every 4 (four) hours as needed for mild pain, moderate pain, fever or headache.    . allopurinol (ZYLOPRIM) 100 MG tablet Take 1 tablet (100 mg total)  by mouth daily. 90 tablet 3  . amiodarone (PACERONE) 100 MG tablet Take 1 tablet (100 mg total) by mouth daily. 30 tablet 11  . apixaban (ELIQUIS) 5 MG TABS tablet Take 2 tablets (10 mg total) by mouth 2 (two) times daily for 7 days, THEN 1 tablet (5 mg total) 2 (two) times daily. 75 tablet 10  . blood glucose meter kit and supplies Dispense based on patient and insurance preference. Use up to four times daily as directed. (FOR ICD-10 E10.9, E11.9). 1 each 0  . busPIRone (BUSPAR) 10 MG tablet Take 1 tablet (10  mg total) by mouth 2 (two) times daily. 60 tablet 11  . carvedilol (COREG) 6.25 MG tablet Take 1 tablet (6.25 mg total) by mouth 2 (two) times daily. 30 tablet 11  . cetirizine (ZYRTEC) 10 MG tablet Take 1 tablet (10 mg total) by mouth daily. 90 tablet 3  . Colchicine (MITIGARE) 0.6 MG CAPS Take 1 tablet by mouth 2 (two) times daily as needed (gout). 30 capsule 6  . Continuous Blood Gluc Receiver (FREESTYLE LIBRE 14 DAY READER) DEVI 1 each by Does not apply route 3 (three) times daily between meals as needed. 1 each 1  . Continuous Blood Gluc Sensor (GUARDIAN SENSOR 3) MISC 1 Device by Does not apply route 3 (three) times daily between meals as needed. 5 each 6  . fluticasone (FLONASE) 50 MCG/ACT nasal spray Place 2 sprays into both nostrils daily. 16 g 6  . gabapentin (NEURONTIN) 300 MG capsule Take 1 capsule (300 mg total) by mouth 3 (three) times daily. 270 capsule 3  . glipiZIDE (GLUCOTROL) 10 MG tablet Take 1 tablet (10 mg total) by mouth 2 (two) times daily before a meal. 180 tablet 3  . glucose blood (ACCU-CHEK GUIDE) test strip USE AS DIRECTED FOUR TIMES DAILY 100 strip 11  . insulin aspart (NOVOLOG FLEXPEN) 100 UNIT/ML FlexPen Inject 0-15 Units into the skin 3 (three) times daily with meals. CBG 121-150: 2u; CBG 151-200: 3u; CBG 201-250: 5u; CBG 251-300: 8u; CBG 301-350: 11u; CBG 351-400:15 u; CBG>400: 15u 45 mL 3  . insulin NPH-regular Human (70-30) 100 UNIT/ML injection Inject 38 Units into the skin 2 (two) times daily with a meal. 60 mL 12  . Insulin Pen Needle (TRUEPLUS 5-BEVEL PEN NEEDLES) 31G X 6 MM MISC 1 Syringe by Does not apply route as needed. 100 each 12  . Insulin Syringe-Needle U-100 (TRUEPLUS INSULIN SYRINGE) 31G X 5/16" 0.5 ML MISC Use to inject insulin twice daily. 100 each 11  . isosorbide-hydrALAZINE (BIDIL) 20-37.5 MG tablet Take 2 tablets by mouth 3 (three) times daily. 180 tablet 11  . Magnesium Oxide 200 MG TABS Take 100 mg by mouth daily.    . meclizine (ANTIVERT) 25  MG tablet Take 1 tablet (25 mg total) by mouth 3 (three) times daily. 90 tablet 3  . metFORMIN (GLUCOPHAGE) 1000 MG tablet Take 1 tablet (1,000 mg total) by mouth 2 (two) times daily with a meal. 60 tablet 11  . sacubitril-valsartan (ENTRESTO) 97-103 MG Take 1 tablet by mouth 2 (two) times daily. 180 tablet 3  . spironolactone (ALDACTONE) 25 MG tablet Take 0.5 tablets (12.5 mg total) by mouth daily. 45 tablet 6  . torsemide (DEMADEX) 20 MG tablet Take 2 tablets (40 mg total) by mouth daily. 60 tablet 6  . traZODone (DESYREL) 100 MG tablet Take 1 tablet (100 mg total) by mouth at bedtime. For sleep 90 tablet 3  . Vitamin D, Ergocalciferol, (  DRISDOL) 1.25 MG (50000 UT) CAPS capsule Take 1 capsule (50,000 Units total) by mouth once a week. 5 capsule 6   No current facility-administered medications for this visit.    Allergies:   Aspirin, Bee venom, Lisinopril, and Tomato   Social History: Social History   Socioeconomic History  . Marital status: Significant Other    Spouse name: Not on file  . Number of children: Not on file  . Years of education: Not on file  . Highest education level: Not on file  Occupational History  . Not on file  Tobacco Use  . Smoking status: Former Smoker    Packs/day: 0.25    Years: 22.00    Pack years: 5.50    Quit date: 04/16/2015    Years since quitting: 5.3  . Smokeless tobacco: Never Used  Vaping Use  . Vaping Use: Never used  Substance and Sexual Activity  . Alcohol use: No    Comment: occ   . Drug use: Yes    Frequency: 14.0 times per week    Types: Marijuana    Comment: uses marijuana twice a day  . Sexual activity: Yes    Birth control/protection: Condom  Other Topics Concern  . Not on file  Social History Narrative  . Not on file   Social Determinants of Health   Financial Resource Strain:   . Difficulty of Paying Living Expenses: Not on file  Food Insecurity:   . Worried About Charity fundraiser in the Last Year: Not on file  .  Ran Out of Food in the Last Year: Not on file  Transportation Needs:   . Lack of Transportation (Medical): Not on file  . Lack of Transportation (Non-Medical): Not on file  Physical Activity:   . Days of Exercise per Week: Not on file  . Minutes of Exercise per Session: Not on file  Stress:   . Feeling of Stress : Not on file  Social Connections:   . Frequency of Communication with Friends and Family: Not on file  . Frequency of Social Gatherings with Friends and Family: Not on file  . Attends Religious Services: Not on file  . Active Member of Clubs or Organizations: Not on file  . Attends Archivist Meetings: Not on file  . Marital Status: Not on file  Intimate Partner Violence:   . Fear of Current or Ex-Partner: Not on file  . Emotionally Abused: Not on file  . Physically Abused: Not on file  . Sexually Abused: Not on file    Family History: Family History  Problem Relation Age of Onset  . Hypertension Mother   . Diabetes Maternal Aunt   . Heart attack Neg Hx   . Stroke Neg Hx     Review of Systems: All other systems reviewed and are otherwise negative except as noted above.   Physical Exam: Vitals:   09/04/20 0952  BP: 114/80  Pulse: 100  SpO2: 95%  Weight: (!) 330 lb 3.2 oz (149.8 kg)  Height: '5\' 7"'  (1.702 m)     GEN- The patient is well appearing, alert and oriented x 3 today.   HEENT: normocephalic, atraumatic; sclera clear, conjunctiva pink; hearing intact; oropharynx clear; neck supple, no JVP Lymph- no cervical lymphadenopathy Lungs- Clear to ausculation bilaterally, normal work of breathing.  No wheezes, rales, rhonchi Heart- Regular rate and rhythm, no murmurs, rubs or gallops, PMI not laterally displaced GI- soft, non-tender, non-distended, bowel sounds present, no hepatosplenomegaly Extremities-  no clubbing or cyanosis. No edema; DP/PT/radial pulses 2+ bilaterally MS- no significant deformity or atrophy Skin- warm and dry, no rash or  lesion; ICD pocket well healed Psych- euthymic mood, full affect Neuro- strength and sensation are intact  ICD interrogation- reviewed in detail today,  See PACEART report  EKG:  EKG is not ordered today.   Recent Labs: 12/18/2019: TSH 2.560 05/16/2020: ALT 23 05/17/2020: BUN 25; Creatinine, Ser 1.54; Hemoglobin 13.4; Magnesium 1.9; Platelets 391; Potassium 4.1; Sodium 139   Wt Readings from Last 3 Encounters:  09/04/20 (!) 330 lb 3.2 oz (149.8 kg)  08/07/20 (!) 328 lb (148.8 kg)  06/26/20 (!) 326 lb (147.9 kg)     Other studies Reviewed: Additional studies/ records that were reviewed today include: Previous EP office notes   Assessment and Plan:  1.  Chronic systolic dysfunction s/p Environmental education officer  euvolemic today Stable on an appropriate medical regimen Normal ICD function See Pace Art report No changes today  2. Paroxysmal AF Continue Eliquis Regular on exam Continue amiodarone 100 mg daily. CMET/TSH today.  Continue yearly eye exams  3. VT None in the interim  4. Morbid Obesity Body mass index is 51.72 kg/m.  Weight loss has been encouraged  Current medicines are reviewed at length with the patient today.   The patient does not have concerns regarding his medicines.  The following changes were made today:  none  Labs/ tests ordered today include:  No orders of the defined types were placed in this encounter.    Disposition:   Follow up with Dr. Caryl Comes or team in 6 months.   Jacalyn Lefevre, PA-C  09/04/2020 10:10 AM  [facility] [address] [place] [address] [phone] (office) [phone] (fax)

## 2020-09-04 NOTE — Patient Instructions (Signed)
Medication Instructions:  *If you need a refill on your cardiac medications before your next appointment, please call your pharmacy*  Lab Work: Your physician has recommended that you have lab work today: CMET, CBC, and TSH If you have labs (blood work) drawn today and your tests are completely normal, you will receive your results only by: [name] MyChart Message (if you have MyChart) OR . A paper copy in the mail If you have any lab test that is abnormal or we need to change your treatment, we will call you to review the results.  Follow-Up: At [facility], you and your health needs are our priority.  As part of our continuing mission to provide you with exceptional heart care, we have created designated Provider Care Teams.  These Care Teams include your primary Cardiologist (physician) and Advanced Practice Providers (APPs -  Physician Assistants and Nurse Practitioners) who all work together to provide you with the care you need, when you need it.  We recommend signing up for the patient portal called "MyChart".  Sign up information is provided on this After Visit Summary.  MyChart is used to connect with patients for Virtual Visits (Telemedicine).  Patients are able to view lab/test results, encounter notes, upcoming appointments, etc.  Non-urgent messages can be sent to your provider as well.   To learn more about what you can do with MyChart, go to [email or web address].    Your next appointment:   Your physician recommends that you schedule a follow-up appointment in: [place] with Dr. Caryl Comes or an EP APP  The format for your next appointment:   In Person with Dr. Caryl Comes or one of the following Advanced Practice Providers on your designated Care Team:    Chanetta Marshall, NP  Tommye Standard, PA-C  Legrand Como "[place]" [place], [place]

## 2020-09-15 NOTE — Progress Notes (Signed)
Patient ID: Jeffrey [name], male   DOB: July 26, 1976, 44 y.o.   MRN: [id number]      Advanced Heart Failure Clinic Note   Patient Name: Jeffrey [name] Date of Encounter: 09/16/2020  Primary Care Provider:  [name], FNP Primary Cardiologist:  Lizbeth Bark, MD  Primary HF Cardiologist: Dioselina Brumbaugh  History: Jeffrey [name] is a 44 y.o. male with a history of  poorly controlled HTN, R renal cell carcinoma s/p nephrectomy, CKD IIIa, DM2, OSA, gout, morbid obesity and systolic HF due to NICM.   Echo in 2012 with EF 20-25%.  In 1/13 had nuclear study EF 45-50% suggestive of NICM.  Admitted 3/16 secondary to atypical chest pain, dyspnea, and mild troponin elevation. Echo with "severely reduced LV function" - EF not quantified due to poor windows. Cath 8/16 showed normal coronaries and moderately elevated filling pressures with moderately depressed cardiac output. Doran s- ICD implanted  Admitted 9/18 with ADHF in setting of new afib. Started on amiodarone. Pt diuresed and converted to NSR without requiring DCCV. Started on Eliquis for [facility].  Admitted 3/19 with ICD shock. Electrolytes stable. Echo 12/31/17 with LVEF 25-30%.   Pt admitted 05/15/20 for S-ICD generator change. Course was complicated with acute hypoxic/hypercapnic respiratory failure due to possible flash pulmonary edema. Had traumatic intubation resulting in aspiration and requiring ventilator support and short term pressor support.  Today he returns for follow up. Feels great. Not walking as much as he was due to the cold weather. Denies SOB, orthopnea or PND. Compliant with all meds.    Echo 12/31/17 LVEF 25-30%, Mod MR, mild RV dilation, Mod TR, RVSP 67 mm Hg.  ECHO 01/16/2016 EF 20-25%  Grade III DD Echo 05/07/15.  EF 20-25%, grade II diastolic dysfunction normal RV size with mild HK     [facility] 05/17/15 RA = 7 RV = 42/3/7 PA = 43/30 (39) PCW = 26 Fick cardiac output/index = 4.9/2.1 PVR = 2.7 WU SVR = 1651 FA sat =  95% PA sat = 65%, 65% Ao = 122/97 (109) LV = 110/31/33 1) Normal coronaries 2) Moderately elevated filling pressures with moderately depressed cardiac output  Review of systems complete and found to be negative unless listed in HPI.    Past Medical History:  Diagnosis Date  . Anxiety   . Aspirin allergy   . Childhood asthma   . Chronic systolic CHF (congestive heart failure) ([place])    a. EF 20-25% in 2012. b. EF 45-50% in 10/2011 with nonischemic nuc - presumed NICM. c. 12/2014 Echo: Sev depressed LV fxn, sev dil LV, mild LVH, mild MR, sev dil LA, mildly reduced RV fxn.  . CKD (chronic kidney disease) stage 2, GFR 60-89 ml/min   . H/O vasectomy 12/2019  . High cholesterol   . Hypertension   . Morbid obesity ([place])   . Nephrolithiasis   . OSA on CPAP   . Presumed NICM    a. 04/2014 Myoview: EF 26%, glob HK, sev glob HK, ? prior infarct;  b. Never cathed 2/2 CKD.  [name] Renal cell carcinoma ([place])    a. s/p Rt robotic assisted partial converted to radical nephrectomy on 01/2013.  . Troponin level elevated    a. 04/2014, 12/2014: felt due to CHF.  . Type II diabetes mellitus ([place])   . Ventricular tachycardia ([place])    a. appropriate ICD therapy 12/2017   Past Surgical History:  Procedure Laterality Date  . APPENDECTOMY  07/2004  . CARDIAC CATHETERIZATION  N/A 05/17/2015   Procedure: Right/Left Heart Cath and Coronary Angiography;  Surgeon: Jolaine Artist, MD;  Location: [facility];  Service: Cardiovascular;  Laterality: N/A;  . EP IMPLANTABLE DEVICE N/A 05/17/2015   Procedure: SubQ ICD Implant;  Surgeon: Will Meredith Leeds, MD;  Location: [facility];  Service: Cardiovascular;  Laterality: N/A;  . ROBOTIC ASSISTED LAPAROSCOPIC LYSIS OF ADHESION  01/13/2013   Procedure: ROBOTIC ASSISTED LAPAROSCOPIC LYSIS OF ADHESION EXTENSIVE;  Surgeon: Alexis Frock, MD;  Location: [facility];  Service: Urology;;  . ROBOTIC ASSITED PARTIAL NEPHRECTOMY Right 01/13/2013   Procedure: ROBOTIC  ASSITED PARTIAL NEPHRECTOMY CONVERTED TO ROBOTIC ASSISTED RIGHT RADICAL NEPHRECTOMY;  Surgeon: Alexis Frock, MD;  Location: [facility];  Service: Urology;  Laterality: Right;  . SUBQ ICD CHANGEOUT N/A 05/16/2020   Procedure: SUBQ ICD CHANGEOUT;  Surgeon: [name], MD;  Location: [place];  Service: Cardiovascular;  Laterality: N/A;  . SUBQ ICD CHANGEOUT N/A 05/15/2020   Procedure: EGBT ICD CHANGEOUT;  Surgeon: [name], MD;  Location: [place] [name];  Service: Cardiovascular;  Laterality: N/A;  . VASECTOMY      Allergies  Allergies  Allergen Reactions  . Aspirin Shortness Of Breath, Itching and Rash     Burning sensation (Patient reports he tolerates other NSAIDS)   . Bee Venom Hives and Swelling  . Lisinopril Cough  . Tomato Rash     Home Medications Current Outpatient Medications  Medication Sig Dispense Refill  . Accu-Chek FastClix Lancets MISC USE AS DIRECTED FOUR TIMES DAILY 102 each 11  . acetaminophen (TYLENOL) 325 MG tablet Take 2 tablets (650 mg total) by mouth every 4 (four) hours as needed for mild pain, moderate pain, fever or headache.    . allopurinol (ZYLOPRIM) 100 MG tablet Take 1 tablet (100 mg total) by mouth daily. 90 tablet 3  . amiodarone (PACERONE) 100 MG tablet Take 1 tablet (100 mg total) by mouth daily. 30 tablet 11  . apixaban (ELIQUIS) 5 MG TABS tablet Take 2 tablets (10 mg total) by mouth 2 (two) times daily for 7 days, THEN 1 tablet (5 mg total) 2 (two) times daily. 75 tablet 10  . blood glucose meter kit and supplies Dispense based on patient and insurance preference. Use up to four times daily as directed. (FOR ICD-10 E10.9, E11.9). 1 each 0  . busPIRone (BUSPAR) 10 MG tablet Take 1 tablet (10 mg total) by mouth 2 (two) times daily. 60 tablet 11  . carvedilol (COREG) 6.25 MG tablet Take 1 tablet (6.25 mg total) by mouth 2 (two) times daily. 30 tablet 11  . cetirizine (ZYRTEC) 10 MG tablet Take 1 tablet (10 mg total) by mouth  daily. 90 tablet 3  . Colchicine (MITIGARE) 0.6 MG CAPS Take 1 tablet by mouth 2 (two) times daily as needed (gout). 30 capsule 6  . Continuous Blood Gluc Receiver (FREESTYLE LIBRE 14 DAY READER) DEVI 1 each by Does not apply route 3 (three) times daily between meals as needed. 1 each 1  . Continuous Blood Gluc Sensor (GUARDIAN SENSOR 3) MISC 1 Device by Does not apply route 3 (three) times daily between meals as needed. 5 each 6  . fluticasone (FLONASE) 50 MCG/ACT nasal spray Place 2 sprays into both nostrils daily. 16 g 6  . gabapentin (NEURONTIN) 300 MG capsule Take 1 capsule (300 mg total) by mouth 3 (three) times daily. 270 capsule 3  . glipiZIDE (GLUCOTROL) 10 MG tablet Take 1  tablet (10 mg total) by mouth 2 (two) times daily before a meal. 180 tablet 3  . glucose blood (ACCU-CHEK GUIDE) test strip USE AS DIRECTED FOUR TIMES DAILY 100 strip 11  . insulin aspart (NOVOLOG FLEXPEN) 100 UNIT/ML FlexPen Inject 0-15 Units into the skin 3 (three) times daily with meals. CBG 121-150: 2u; CBG 151-200: 3u; CBG 201-250: 5u; CBG 251-300: 8u; CBG 301-350: 11u; CBG 351-400:15 u; CBG>400: 15u 45 mL 3  . insulin NPH-regular Human (70-30) 100 UNIT/ML injection Inject 38 Units into the skin 2 (two) times daily with a meal. 60 mL 12  . Insulin Pen Needle (TRUEPLUS 5-BEVEL PEN NEEDLES) 31G X 6 MM MISC 1 Syringe by Does not apply route as needed. 100 each 12  . Insulin Syringe-Needle U-100 (TRUEPLUS INSULIN SYRINGE) 31G X 5/16" 0.5 ML MISC Use to inject insulin twice daily. 100 each 11  . isosorbide-hydrALAZINE (BIDIL) 20-37.5 MG tablet Take 2 tablets by mouth 3 (three) times daily. 180 tablet 11  . Magnesium Oxide 200 MG TABS Take 100 mg by mouth daily.    . meclizine (ANTIVERT) 25 MG tablet Take 1 tablet (25 mg total) by mouth 3 (three) times daily. 90 tablet 3  . metFORMIN (GLUCOPHAGE) 1000 MG tablet Take 1 tablet (1,000 mg total) by mouth 2 (two) times daily with a meal. 60 tablet 11  . sacubitril-valsartan  (ENTRESTO) 97-103 MG Take 1 tablet by mouth 2 (two) times daily. 180 tablet 3  . spironolactone (ALDACTONE) 25 MG tablet Take 0.5 tablets (12.5 mg total) by mouth daily. 45 tablet 6  . torsemide (DEMADEX) 20 MG tablet Take 2 tablets (40 mg total) by mouth daily. 60 tablet 6  . traZODone (DESYREL) 100 MG tablet Take 1 tablet (100 mg total) by mouth at bedtime. For sleep 90 tablet 3  . Vitamin D, Ergocalciferol, (DRISDOL) 1.25 MG (50000 UT) CAPS capsule Take 1 capsule (50,000 Units total) by mouth once a week. 5 capsule 6   No current facility-administered medications for this encounter.   Vitals:   09/16/20 1025  BP: 120/70  Pulse: 88  SpO2: 98%  Weight: (!) 149.2 kg (329 lb)   Wt Readings from Last 3 Encounters:  09/16/20 (!) 149.2 kg (329 lb)  09/04/20 (!) 149.8 kg (330 lb 3.2 oz)  08/07/20 (!) 148.8 kg (328 lb)   Physical Exam General:  Well appearing. No resp difficulty HEENT: normal Neck: supple. no JVD. Carotids 2+ bilat; no bruits. No lymphadenopathy or thryomegaly appreciated. Cor: PMI nondisplaced. Regular rate & rhythm. No rubs, gallops or murmurs. Lungs: clear Abdomen: obese soft, nontender, nondistended. No hepatosplenomegaly. No bruits or masses. Good bowel sounds. Extremities: no cyanosis, clubbing, rash, edema Neuro: alert & orientedx3, cranial nerves grossly intact. moves all 4 extremities w/o difficulty. Affect pleasant  Assessment & Plan 1.  Chronic systolic congestive heart failure: s/p [employer] subcutaneous ICD implant 05/17/15 (gen change 8/21 - c/b traumatic intubation).  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  - Echo 12/31/17 LVEF 25-30%, Mod MR. RVSP 67 - Echo 9/21: EF 20-25% Personally reviewed - NYHA II. Volume status ok - Continue torsemide 40 mg daily  - Continue Entresto 97/103 mg BID. Watch closely with solitary kidney.  - Continue bidil 1 tab TID - Continue carvedilol 3.125 bid.  - Continue spiro 12.5 daily  - Start Farxiga 10  2.  Paroxysmal Afib - New diagnosis 06/2017.  - Maintaining NSR today - Continue Eliquis 5 mg BID.  - Continue amio 100 daily. Check TFTs  and LFTs today.   3. VT with ICD shock 12/29/17 - on amio 100 mg daily. No recent VT 4. Hypertension:  - Blood pressure well controlled. Continue current regimen. 5. CKD stage IIIa in setting of solitary kidney -creatinine 1.5-1.7.  - Follows at [facility] - recent labs ok 09/04/20 6. OSA:  - Back on CPAP.  7. Tobacco use: - Former smoker. No change.  8. Gout:  - Continue allopurinol 200 daily for prophylaxis. No change.  9. DM2:  - Stable. Per PCP. Recent HgBa1c ~6% - Start Farxiga 10 10.Renal Cell Carcinoma - S/P nephrectomy 2014.  - Follows at [facility] and has follow up next month.  11. Morbid Obesity - Body mass index is 51.53 kg/m.     Glori Bickers, MD  10:45 AM

## 2020-09-16 ENCOUNTER — Encounter (HOSPITAL_COMMUNITY): Payer: Self-pay | Admitting: Internal Medicine

## 2020-09-16 ENCOUNTER — Ambulatory Visit (HOSPITAL_COMMUNITY)
Admission: RE | Admit: 2020-09-16 | Discharge: 2020-09-16 | Disposition: A | Discharge status: Home / Self Care | Payer: Medicaid Other | Source: Ambulatory Visit | Attending: Internal Medicine | Admitting: Internal Medicine

## 2020-09-16 ENCOUNTER — Other Ambulatory Visit: Payer: Self-pay

## 2020-09-16 VITALS — BP 120/70 | HR 88 | Wt 329.0 lb

## 2020-09-16 DIAGNOSIS — E1122 Type 2 diabetes mellitus with diabetic chronic kidney disease: Secondary | ICD-10-CM | POA: Diagnosis not present

## 2020-09-16 DIAGNOSIS — G4733 Obstructive sleep apnea (adult) (pediatric): Secondary | ICD-10-CM | POA: Insufficient documentation

## 2020-09-16 DIAGNOSIS — E7800 Pure hypercholesterolemia, unspecified: Secondary | ICD-10-CM | POA: Diagnosis not present

## 2020-09-16 DIAGNOSIS — N1831 Chronic kidney disease, stage 3a: Secondary | ICD-10-CM | POA: Diagnosis not present

## 2020-09-16 DIAGNOSIS — I480 Paroxysmal atrial fibrillation: Secondary | ICD-10-CM | POA: Diagnosis not present

## 2020-09-16 DIAGNOSIS — I10 Essential (primary) hypertension: Secondary | ICD-10-CM

## 2020-09-16 DIAGNOSIS — I428 Other cardiomyopathies: Secondary | ICD-10-CM | POA: Insufficient documentation

## 2020-09-16 DIAGNOSIS — Z79899 Other long term (current) drug therapy: Secondary | ICD-10-CM | POA: Diagnosis not present

## 2020-09-16 DIAGNOSIS — I472 Ventricular tachycardia, unspecified: Secondary | ICD-10-CM

## 2020-09-16 DIAGNOSIS — I5022 Chronic systolic (congestive) heart failure: Secondary | ICD-10-CM | POA: Diagnosis not present

## 2020-09-16 DIAGNOSIS — Z95810 Presence of automatic (implantable) cardiac defibrillator: Secondary | ICD-10-CM | POA: Diagnosis not present

## 2020-09-16 DIAGNOSIS — Z85528 Personal history of other malignant neoplasm of kidney: Secondary | ICD-10-CM | POA: Insufficient documentation

## 2020-09-16 DIAGNOSIS — M109 Gout, unspecified: Secondary | ICD-10-CM | POA: Insufficient documentation

## 2020-09-16 DIAGNOSIS — I130 Hypertensive heart and chronic kidney disease with heart failure and stage 1 through stage 4 chronic kidney disease, or unspecified chronic kidney disease: Secondary | ICD-10-CM | POA: Insufficient documentation

## 2020-09-16 DIAGNOSIS — Z6841 Body Mass Index (BMI) 40.0 and over, adult: Secondary | ICD-10-CM | POA: Insufficient documentation

## 2020-09-16 DIAGNOSIS — Z794 Long term (current) use of insulin: Secondary | ICD-10-CM | POA: Insufficient documentation

## 2020-09-16 DIAGNOSIS — Z905 Acquired absence of kidney: Secondary | ICD-10-CM | POA: Diagnosis not present

## 2020-09-16 DIAGNOSIS — Z7901 Long term (current) use of anticoagulants: Secondary | ICD-10-CM | POA: Diagnosis not present

## 2020-09-16 DIAGNOSIS — Z87891 Personal history of nicotine dependence: Secondary | ICD-10-CM | POA: Diagnosis not present

## 2020-09-16 MED ORDER — EMPAGLIFLOZIN 10 MG PO TABS
10.0000 mg | ORAL_TABLET | Freq: Every day | ORAL | 6 refills | Status: DC
Start: 2020-09-16 — End: 2020-12-06

## 2020-09-16 NOTE — Addendum Note (Signed)
Encounter addended by: Scarlette Calico, RN on: 09/16/2020 10:53 AM  Actions taken: Pharmacy for encounter modified, Order list changed, Clinical Note Signed

## 2020-09-16 NOTE — Patient Instructions (Signed)
Start Jardiance 10 mg Daily  Your provider has prescribed Jardiance for you. Please be aware the most common side effect of this medication is urinary tract infections and yeast infections. Please practice good hygiene and keep this area clean and dry to help prevent this. If you do begin to have symptoms of these infections, such as difficulty urinating or painful urination,  please let us know.  Please call our office in March to schedule your follow up appointment  If you have any questions or concerns before your next appointment please send [place] a message through [place] or call our office at [phone].    TO LEAVE A MESSAGE FOR THE NURSE SELECT OPTION 2, PLEASE LEAVE A MESSAGE INCLUDING: . YOUR NAME . DATE OF BIRTH . CALL BACK NUMBER . REASON FOR CALL**this is important as we prioritize the call backs  [place] AS LONG AS YOU CALL BEFORE 4:00 PM  At the [facility], you and your health needs are our priority. As part of our continuing mission to provide you with exceptional heart care, we have created designated Provider Care Teams. These Care Teams include your primary Cardiologist (physician) and Advanced Practice Providers (APPs- Physician Assistants and Nurse Practitioners) who all work together to provide you with the care you need, when you need it.   You may see any of the following providers on your designated Care Team at your next follow up: [name] Dr Glori Bickers . Dr Loralie Champagne . Darrick Grinder, NP . Lyda Jester, PA . Audry Riles, PharmD   Please be sure to bring in all your medications bottles to every appointment.

## 2020-09-17 ENCOUNTER — Other Ambulatory Visit: Payer: Self-pay | Admitting: Family Medicine

## 2020-09-17 ENCOUNTER — Telehealth (HOSPITAL_COMMUNITY): Payer: Self-pay | Admitting: Pharmacy Technician

## 2020-09-17 MED FILL — ELIQUIS 5 MG TABLET: 5 | 30 days supply | Qty: 60 | Fill #0

## 2020-09-17 NOTE — Telephone Encounter (Signed)
Advanced Heart Failure Patient Advocate Encounter  Prior Authorization for Jeffrey [name] has been approved.    PA# [id number] Effective dates: 09/17/20 through 09/17/21   Charlann Boxer, CPhT

## 2020-09-18 MED FILL — TRUEPLUS SYR 0.5ML 31GX5/16: 31G X 5/16" | 34 days supply | Qty: 100 | Fill #0

## 2020-09-23 ENCOUNTER — Ambulatory Visit: Payer: Medicaid Other | Admitting: Family Medicine

## 2020-09-24 ENCOUNTER — Other Ambulatory Visit: Payer: Self-pay | Admitting: Obstetrics and Gynecology

## 2020-09-24 ENCOUNTER — Other Ambulatory Visit: Payer: Self-pay

## 2020-09-24 NOTE — Patient Instructions (Signed)
Hi Jeffrey [name], thank you for speaking with me today.  Jeffrey [name] was given information about Medicaid Managed Care team care coordination services as a part of their Healthy [facility] Medicaid benefit. Jeffrey [name] verbally consented to engagement with the [facility] Managed Care team.   For questions related to your Healthy [facility] health plan, please call: [phone] or visit the homepage here: [email or web address]  If you would like to schedule transportation through your Healthy [facility] plan, please call the following number at least 2 days in advance of your appointment: [phone]  Goals Addressed            This Visit's Progress   . Protect My Health       Timeframe:  Long-Range Goal Priority:  High Start Date:    09/24/20                         Expected End Date:     12/23/20                    - schedule recommended health tests  - schedule and keep appointment for annual check-up          Patient Care Plan: General Plan of Care (Adult)    Problem Identified: Health Promotion or Disease Self-Management (General Plan of Care)   Priority: High  Onset Date: 09/24/2020  Note:   Current Barriers:  . Chronic Disease Management support and education needs.  Nurse Case Manager Clinical Goal(s):  [name] Over the next 90 days, patient will attend all scheduled medical appointments:   Interventions:  . Inter-disciplinary care team collaboration (see longitudinal plan of care) . Evaluation of current treatment plan and patient's adherence to plan as established by provider. . Reviewed medications with patient. . Discussed plans with patient for ongoing care management follow up and provided patient with direct contact information for care management team . Provided patient with educational materials related to exercise. . Reviewed scheduled/upcoming provider appointments.  Patient Goals/Self-Care Activities Over the next 90 days, patient  will:  -Attends all scheduled provider appointments Calls provider office for new concerns or questions  Follow Up Plan: The Managed Medicaid care management team will reach out to the patient again over the next 30 days.  The patient has been provided with contact information for the Managed Medicaid care management team and has been advised to call with any health related questions or concerns.     Patient verbalizes understanding of instructions provided today.   The Managed Medicaid care management team will reach out to the patient again over the next 30 days.  The patient has been provided with contact information for the Managed Medicaid care management team and has been advised to call with any health related questions or concerns.   [name] RN, BSN [place]  [facility] [employer] - Managed Medicaid High Risk [phone]

## 2020-09-24 NOTE — Patient Outreach (Signed)
Care Coordination - Case Manager  09/24/2020  Jeffrey [name] 1976/09/21 [id number]  Subjective:  Jeffrey [name] is an 44 y.o. year old male who is a primary patient of Jeffrey Glatter, FNP.  [name] was given information about Medicaid Managed Care team care coordination services today. Jeffrey [name] agreed to services and verbal consent obtained  Review of patient status, laboratory and other test data was performed as part of evaluation for provision of services.  SDOH: SDOH Screenings   Alcohol Screen: Not on file  Depression (PHQ2-9): Low Risk   . PHQ-2 Score: 0  Financial Resource Strain: Not on file  Food Insecurity: Not on file  Housing: Not on file  Physical Activity: Not on file  Social Connections: Not on file  Stress: Not on file  Tobacco Use: Medium Risk  . Smoking Tobacco Use: Former Smoker  . Smokeless Tobacco Use: Never Used  Transportation Needs: Not on file     Objective:    Allergies  Allergen Reactions  . Aspirin Shortness Of Breath, Itching and Rash     Burning sensation (Patient reports he tolerates other NSAIDS)   . Bee Venom Hives and Swelling  . Lisinopril Cough  . Tomato Rash    Medications:    Medications Reviewed Today    Reviewed by Jeffrey Medicus, RN (Registered Nurse) on 09/24/20 at 1042  Med List Status: <None>  Medication Order Taking? Sig Documenting Provider Last Dose Status Informant  Accu-Chek FastClix Lancets MISC [id number] No USE AS DIRECTED FOUR TIMES DAILY Jeffrey Glatter, FNP Taking Active Self  acetaminophen (TYLENOL) 325 MG tablet [id number] No Take 2 tablets (650 mg total) by mouth every 4 (four) hours as needed for mild pain, moderate pain, fever or headache. Jeffrey Friar, PA-C Taking Active   allopurinol (ZYLOPRIM) 100 MG tablet [id number] No Take 1 tablet (100 mg total) by mouth daily. Jeffrey Glatter, FNP Taking Active   amiodarone (PACERONE) 100 MG tablet [id number] No Take 1 tablet (100 mg total) by  mouth daily. [name], Jeffrey Pascal, MD Taking Active   apixaban (ELIQUIS) 5 MG TABS tablet [id number] No Take 2 tablets (10 mg total) by mouth 2 (two) times daily for 7 days, THEN 1 tablet (5 mg total) 2 (two) times daily. [name], Jeffrey Pascal, MD Taking Active   blood glucose meter kit and supplies [id number] No Dispense based on patient and insurance preference. Use up to four times daily as directed. (FOR ICD-10 E10.9, E11.9). Jeffrey Glatter, FNP Taking Active Self  busPIRone (BUSPAR) 10 MG tablet [id number] No Take 1 tablet (10 mg total) by mouth 2 (two) times daily. Jeffrey Glatter, FNP Taking Active   carvedilol (COREG) 6.25 MG tablet [id number] No Take 1 tablet (6.25 mg total) by mouth 2 (two) times daily. [name], Jeffrey Pascal, MD Taking Active   cetirizine (ZYRTEC) 10 MG tablet [id number] No Take 1 tablet (10 mg total) by mouth daily. Jeffrey Glatter, FNP Taking Active   Colchicine (MITIGARE) 0.6 MG CAPS [id number] No Take 1 tablet by mouth 2 (two) times daily as needed (gout). Jeffrey Glatter, FNP Taking Active   Continuous Blood Gluc Receiver (FREESTYLE LIBRE 14 DAY READER) DEVI [id number] No 1 each by Does not apply route 3 (three) times daily between meals as needed. Jeffrey Glatter, FNP Taking Active Self  Continuous Blood Gluc Sensor (GUARDIAN SENSOR 3) MISC [id number] No 1 Device by Does not apply route 3 (three) times daily between meals  as needed. Jeffrey Glatter, FNP Taking Active Self  empagliflozin (JARDIANCE) 10 MG TABS tablet [id number]  Take 1 tablet (10 mg total) by mouth daily before breakfast. [name], Jeffrey Pascal, MD  Active   fluticasone (FLONASE) 50 MCG/ACT nasal spray [id number] No Place 2 sprays into both nostrils daily. Jeffrey Glatter, FNP Taking Active Self  gabapentin (NEURONTIN) 300 MG capsule [id number] No Take 1 capsule (300 mg total) by mouth 3 (three) times daily. Jeffrey Glatter, FNP Taking Active   glipiZIDE (GLUCOTROL) 10 MG tablet [id number] No Take 1 tablet  (10 mg total) by mouth 2 (two) times daily before a meal. Jeffrey Glatter, FNP Taking Active   glucose blood (ACCU-CHEK GUIDE) test strip [id number] No USE AS DIRECTED FOUR TIMES DAILY Jeffrey Glatter, FNP Taking Active Self  insulin aspart (NOVOLOG FLEXPEN) 100 UNIT/ML FlexPen [id number] No Inject 0-15 Units into the skin 3 (three) times daily with meals. CBG 121-150: 2u; CBG 151-200: 3u; CBG 201-250: 5u; CBG 251-300: 8u; CBG 301-350: 11u; CBG 351-400:15 u; CBG>400: 15u Jeffrey Glatter, FNP Taking Active   insulin NPH-regular Human (70-30) 100 UNIT/ML injection [id number] No Inject 38 Units into the skin 2 (two) times daily with a meal. Jeffrey Glatter, FNP Taking Active   Insulin Pen Needle (TRUEPLUS 5-BEVEL PEN NEEDLES) 31G X 6 MM MISC [id number] No 1 Syringe by Does not apply route as needed. Jeffrey Glatter, FNP Taking Active Self  isosorbide-hydrALAZINE (BIDIL) 20-37.5 MG tablet [id number] No Take 2 tablets by mouth 3 (three) times daily. [name], Jeffrey Pascal, MD Taking Active   Magnesium Oxide 200 MG TABS [id number] No Take 100 mg by mouth daily. [provider] Taking Active Self  meclizine (ANTIVERT) 25 MG tablet [id number] No Take 1 tablet (25 mg total) by mouth 3 (three) times daily. Jeffrey Glatter, FNP Taking Active   metFORMIN (GLUCOPHAGE) 1000 MG tablet [id number] No Take 1 tablet (1,000 mg total) by mouth 2 (two) times daily with a meal. Jeffrey Glatter, FNP Taking Active   sacubitril-valsartan (ENTRESTO) 97-103 MG [id number] No Take 1 tablet by mouth 2 (two) times daily. Jeffrey Glatter, FNP Taking Active   spironolactone (ALDACTONE) 25 MG tablet [id number] No Take 0.5 tablets (12.5 mg total) by mouth daily. Jeffrey Glatter, FNP Taking Active   torsemide (DEMADEX) 20 MG tablet [id number] No Take 2 tablets (40 mg total) by mouth daily. Jeffrey Glatter, FNP Taking Active   traZODone (DESYREL) 100 MG tablet [id number] No Take 1 tablet (100 mg total) by mouth at bedtime. For  sleep Jeffrey Glatter, FNP Taking Active   TRUEPLUS INSULIN SYRINGE 31G X 5/16" 0.5 ML MISC [id number]  USE TO INJECT INSULIN TWICE DAILY. Jeffrey Glatter, FNP  Active   Vitamin D, Ergocalciferol, (DRISDOL) 1.25 MG (50000 UT) CAPS capsule [id number] No Take 1 capsule (50,000 Units total) by mouth once a week. Jeffrey Glatter, FNP Taking Active Self           Med Note Owens Shark, Blanche [place]   Wed May 29, 2020  3:02 PM)            Assessment:   Goals Addressed            This Visit's Progress   . Protect My Health       Timeframe:  Long-Range Goal Priority:  High Start Date:    09/24/20  Expected End Date:     12/23/20                    - schedule recommended health tests  - schedule and keep appointment for annual check-up          Patient Care Plan: General Plan of Care (Adult)    Problem Identified: Health Promotion or Disease Self-Management (General Plan of Care)   Priority: High  Onset Date: 09/24/2020  Note:   Current Barriers:  . Chronic Disease Management support and education needs.  Nurse Case Manager Clinical Goal(s):  [name] Over the next 90 days, patient will attend all scheduled medical appointments:   Interventions:  . Inter-disciplinary care team collaboration (see longitudinal plan of care) . Evaluation of current treatment plan and patient's adherence to plan as established by provider. . Reviewed medications with patient. . Discussed plans with patient for ongoing care management follow up and provided patient with direct contact information for care management team . Provided patient with educational materials related to exercise. . Reviewed scheduled/upcoming provider appointments.  Patient Goals/Self-Care Activities Over the next 90 days, patient will:  -Attends all scheduled provider appointments Calls provider office for new concerns or questions  Follow Up Plan: The Managed Medicaid care management team will reach out  to the patient again over the next 30 days.  The patient has been provided with contact information for the Managed Medicaid care management team and has been advised to call with any health related questions or concerns.     Plan: RNCM will follow up with patient within 30 days. Follow-up:  Patient agrees to Care Plan and Follow-up.

## 2020-10-10 MED FILL — AMIODARONE HCL 100 MG TABS: 100 | 30 days supply | Qty: 30 | Fill #2

## 2020-10-14 DIAGNOSIS — Z1152 Encounter for screening for COVID-19: Secondary | ICD-10-CM | POA: Diagnosis not present

## 2020-10-14 MED FILL — MITIGARE 0.6 MG CAPS: 0.6 | 30 days supply | Qty: 30 | Fill #1

## 2020-10-14 MED FILL — CARVEDILOL 6.25 MG TABLET: 6.25 | 30 days supply | Qty: 30 | Fill #1

## 2020-10-14 MED FILL — busPIRone HCL 10 MG TABS: 10 | 30 days supply | Qty: 60 | Fill #2

## 2020-10-22 DIAGNOSIS — G4733 Obstructive sleep apnea (adult) (pediatric): Secondary | ICD-10-CM | POA: Diagnosis not present

## 2020-10-22 MED FILL — METFORMIN HCL 1000 MG TABS: 1000 | 30 days supply | Qty: 60 | Fill #2

## 2020-10-25 ENCOUNTER — Other Ambulatory Visit: Payer: Self-pay | Admitting: Obstetrics and Gynecology

## 2020-10-25 DIAGNOSIS — Z1152 Encounter for screening for COVID-19: Secondary | ICD-10-CM | POA: Diagnosis not present

## 2020-10-25 NOTE — Patient Outreach (Signed)
Care Coordination  10/25/2020  ANDREUS CURE 19-Apr-1976 [id number]    Medicaid Managed Care   Unsuccessful Outreach Note  10/25/2020 Name: ALASTER ASFAW MRN: [id number] DOB: Feb 20, 1976  Referred by: [name], FNP Reason for referral : High Risk Managed Medicaid (Unsuccessful telephone outreach)   An unsuccessful telephone outreach was attempted today. The patient was referred to the case management team for assistance with care management and care coordination.   Follow Up Plan: The Managed Medicaid  care management team will reach out to the patient again over the next 7 days.   [name] RN, BSN [place]  [facility] - Managed Medicaid High Risk [phone].

## 2020-10-25 NOTE — Patient Instructions (Signed)
Hi Jeffrey [name], sorry we missed you today  - as a part of your Medicaid benefit, you are eligible for care management and care coordination services at no cost or copay. I was unable to reach you by phone today but would be happy to help you with your health related needs. Please feel free to call me at [phone].  A member of the Managed Medicaid care management team will reach out to you again over the next 7 days.   [name] RN, BSN [place]  [facility] - Managed Medicaid High Risk [phone].

## 2020-10-28 ENCOUNTER — Other Ambulatory Visit: Payer: Self-pay

## 2020-10-28 ENCOUNTER — Ambulatory Visit (INDEPENDENT_AMBULATORY_CARE_PROVIDER_SITE_OTHER): Payer: Medicaid Other | Admitting: Family Medicine

## 2020-10-28 DIAGNOSIS — Z0000 Encounter for general adult medical examination without abnormal findings: Secondary | ICD-10-CM | POA: Diagnosis not present

## 2020-10-28 DIAGNOSIS — I10 Essential (primary) hypertension: Secondary | ICD-10-CM

## 2020-10-28 DIAGNOSIS — Z794 Long term (current) use of insulin: Secondary | ICD-10-CM | POA: Diagnosis not present

## 2020-10-28 DIAGNOSIS — R7303 Prediabetes: Secondary | ICD-10-CM

## 2020-10-28 DIAGNOSIS — K921 Melena: Secondary | ICD-10-CM

## 2020-10-28 DIAGNOSIS — N183 Chronic kidney disease, stage 3 unspecified: Secondary | ICD-10-CM | POA: Diagnosis not present

## 2020-10-28 DIAGNOSIS — Z09 Encounter for follow-up examination after completed treatment for conditions other than malignant neoplasm: Secondary | ICD-10-CM

## 2020-10-28 DIAGNOSIS — Z1211 Encounter for screening for malignant neoplasm of colon: Secondary | ICD-10-CM

## 2020-10-28 DIAGNOSIS — E119 Type 2 diabetes mellitus without complications: Secondary | ICD-10-CM

## 2020-10-28 DIAGNOSIS — E1122 Type 2 diabetes mellitus with diabetic chronic kidney disease: Secondary | ICD-10-CM | POA: Diagnosis not present

## 2020-10-28 NOTE — Progress Notes (Signed)
Patient [facility]   Established Patient Office Visit  Subjective:  Patient ID: Jeffrey [name], male    DOB: [birth year]  Age: 45 y.o. MRN: [id number]  CC: No chief complaint on file.   HPI Jeffrey [name] is a 45 year old male who presents for Follow Up today.   Patient Active Problem List   Diagnosis Date Noted  . Aspiration into airway   . ICD (implantable cardioverter-defibrillator) battery depletion 05/15/2020  . Endotracheal tube present   . Respiratory failure ([place])   . Acute encephalopathy   . Acute pulmonary edema (HCC)   . NICM (nonischemic cardiomyopathy) ([place]) 04/24/2020  . S/P vasectomy 12/20/2019  . Anxiety 12/20/2019  . Hemoglobin A1C between 7% and 9% indicating borderline diabetic control 12/20/2019  . Seasonal allergies 12/20/2019  . Insomnia 12/20/2019  . Vasectomy evaluation 06/20/2019  . Visual problems 06/20/2019  . Blurry vision, bilateral 06/20/2019  . Hyperglycemia 02/13/2019  . Hyperosmolar (nonketotic) coma ([place]) 01/19/2019  . AICD (automatic cardioverter/defibrillator) present 01/19/2019  . Hyperlipidemia 12/07/2018  . Follow-up exam 11/22/2018  . Class 3 severe obesity due to excess calories with serious comorbidity and body mass index (BMI) of 45.0 to 49.9 in adult ([place]) 11/22/2018  . Dizziness 11/22/2018  . Lingular pneumonia 08/04/2018  . Ventricular tachycardia ([place]) 12/30/2017  . PAF (paroxysmal atrial fibrillation) ([place])   . Dilated cardiomyopathy ([place])   . Pollen allergy 02/17/2017  . Dyslipidemia 02/17/2017  . Hematochezia 11/08/2015  . CKD (chronic kidney disease), stage III ([place]) 06/20/2015  . Cardiac defibrillator in situ   . Chronic systolic CHF (congestive heart failure) ([place]) 03/11/2015  . Gouty arthritis 03/11/2015  . Chest pain 12/28/2014  . Aspirin allergy 04/08/2014  . Renal cell carcinoma ([place])   . Gout attack 11/10/2011  . Morbid obesity ([place]) 10/23/2011  . OSA (obstructive  sleep apnea) 10/23/2011  . Type 2 diabetes mellitus with stage 3 chronic kidney disease, with long-term current use of insulin ([place]) 10/22/2011  . Hyperlipidemia associated with type 2 diabetes mellitus ([place]) 12/25/2010  . Hypertension associated with diabetes ([place]) 12/25/2010   Current Status: Since his last office visit, he is doing well with no complaints. His most recent normal range of preprandial blood glucose levels have been between 100-120. He has seen low range of 60, which he ate candy to increase blood glucose; and high of 215 since his last office visit. He does experience occasional blurry vision at times. He denies fatigue, frequent urination, excessive hunger, excessive thirst, weight gain, weight loss, and poor wound healing. He continues to check his feet regularly. He continues to follow up with Cardiology regularly, as needed. Denies chest pain, cough, shortness of breath, heart palpitations, and falls. He has occasional headaches and dizziness with position changes. Denies severe headaches, confusion, seizures, double vision, and blurred vision, nausea and vomiting. He denies fevers, chills, recent infections, weight loss, and night sweats. He is requesting referral to GI for Colonoscopy, as he states that he has occasional episodes of blood in his stools. Denies GI problems such as diarrhea, and constipation. He has no reports dysuria and hematuria. No depression or anxiety reported today. He is taking all medications as prescribed. He denies pain today.   Past Medical History:  Diagnosis Date  . Anxiety   . Aspirin allergy   . Childhood asthma   . Chronic systolic CHF (congestive heart failure) ([place])    a. EF 20-25% in 2012. b. EF  45-50% in 10/2011 with nonischemic nuc - presumed NICM. c. 12/2014 Echo: Sev depressed LV fxn, sev dil LV, mild LVH, mild MR, sev dil LA, mildly reduced RV fxn.  . CKD (chronic kidney disease) stage 2, GFR 60-89 ml/min   . H/O vasectomy 12/2019  . High  cholesterol   . Hypertension   . Morbid obesity ([place])   . Nephrolithiasis   . OSA on CPAP   . Presumed NICM    a. 04/2014 Myoview: EF 26%, glob HK, sev glob HK, ? prior infarct;  b. Never cathed 2/2 CKD.  [name] Renal cell carcinoma ([place])    a. s/p Rt robotic assisted partial converted to radical nephrectomy on 01/2013.  . Troponin level elevated    a. 04/2014, 12/2014: felt due to CHF.  . Type II diabetes mellitus ([place])   . Ventricular tachycardia ([place])    a. appropriate ICD therapy 12/2017    Past Surgical History:  Procedure Laterality Date  . APPENDECTOMY  07/2004  . CARDIAC CATHETERIZATION N/A 05/17/2015   Procedure: Right/Left Heart Cath and Coronary Angiography;  Surgeon: Jolaine Artist, MD;  Location: [facility];  Service: Cardiovascular;  Laterality: N/A;  . EP IMPLANTABLE DEVICE N/A 05/17/2015   Procedure: SubQ ICD Implant;  Surgeon: Will Meredith Leeds, MD;  Location: [facility];  Service: Cardiovascular;  Laterality: N/A;  . ROBOTIC ASSISTED LAPAROSCOPIC LYSIS OF ADHESION  01/13/2013   Procedure: ROBOTIC ASSISTED LAPAROSCOPIC LYSIS OF ADHESION EXTENSIVE;  Surgeon: Alexis Frock, MD;  Location: [facility];  Service: Urology;;  . ROBOTIC ASSITED PARTIAL NEPHRECTOMY Right 01/13/2013   Procedure: ROBOTIC ASSITED PARTIAL NEPHRECTOMY CONVERTED TO ROBOTIC ASSISTED RIGHT RADICAL NEPHRECTOMY;  Surgeon: Alexis Frock, MD;  Location: [facility];  Service: Urology;  Laterality: Right;  . SUBQ ICD CHANGEOUT N/A 05/16/2020   Procedure: SUBQ ICD CHANGEOUT;  Surgeon: [name], MD;  Location: [facility];  Service: Cardiovascular;  Laterality: N/A;  . SUBQ ICD CHANGEOUT N/A 05/15/2020   Procedure: ZOXW ICD CHANGEOUT;  Surgeon: [name], MD;  Location: [facility];  Service: Cardiovascular;  Laterality: N/A;  . VASECTOMY      Family History  Problem Relation Age of Onset  . Hypertension Mother   . Diabetes Maternal Aunt   . Heart attack Neg Hx   . Stroke  Neg Hx     Social History   Socioeconomic History  . Marital status: Significant Other    Spouse name: Not on file  . Number of children: Not on file  . Years of education: Not on file  . Highest education level: Not on file  Occupational History  . Not on file  Tobacco Use  . Smoking status: Former Smoker    Packs/day: 0.25    Years: 22.00    Pack years: 5.50    Quit date: 04/16/2015    Years since quitting: 5.5  . Smokeless tobacco: Never Used  Vaping Use  . Vaping Use: Never used  Substance and Sexual Activity  . Alcohol use: No    Comment: occ   . Drug use: Yes    Frequency: 14.0 times per week    Types: Marijuana    Comment: uses marijuana twice a day  . Sexual activity: Yes    Birth control/protection: Condom  Other Topics Concern  . Not on file  Social History Narrative  . Not on file   Social Determinants of Health   Financial Resource Strain: Not on  file  Food Insecurity: Not on file  Transportation Needs: Not on file  Physical Activity: Not on file  Stress: Not on file  Social Connections: Not on file  Intimate Partner Violence: Not on file    Outpatient Medications Prior to Visit  Medication Sig Dispense Refill  . Accu-Chek FastClix Lancets MISC USE AS DIRECTED FOUR TIMES DAILY 102 each 11  . acetaminophen (TYLENOL) 325 MG tablet Take 2 tablets (650 mg total) by mouth every 4 (four) hours as needed for mild pain, moderate pain, fever or headache.    . allopurinol (ZYLOPRIM) 100 MG tablet Take 1 tablet (100 mg total) by mouth daily. 90 tablet 3  . amiodarone (PACERONE) 100 MG tablet Take 1 tablet (100 mg total) by mouth daily. 30 tablet 11  . apixaban (ELIQUIS) 5 MG TABS tablet Take 2 tablets (10 mg total) by mouth 2 (two) times daily for 7 days, THEN 1 tablet (5 mg total) 2 (two) times daily. 75 tablet 10  . blood glucose meter kit and supplies Dispense based on patient and insurance preference. Use up to four times daily as directed. (FOR ICD-10  E10.9, E11.9). 1 each 0  . busPIRone (BUSPAR) 10 MG tablet Take 1 tablet (10 mg total) by mouth 2 (two) times daily. 60 tablet 11  . carvedilol (COREG) 6.25 MG tablet Take 1 tablet (6.25 mg total) by mouth 2 (two) times daily. 30 tablet 11  . cetirizine (ZYRTEC) 10 MG tablet Take 1 tablet (10 mg total) by mouth daily. 90 tablet 3  . Colchicine (MITIGARE) 0.6 MG CAPS Take 1 tablet by mouth 2 (two) times daily as needed (gout). 30 capsule 6  . Continuous Blood Gluc Receiver (FREESTYLE LIBRE 14 DAY READER) DEVI 1 each by Does not apply route 3 (three) times daily between meals as needed. 1 each 1  . Continuous Blood Gluc Sensor (GUARDIAN SENSOR 3) MISC 1 Device by Does not apply route 3 (three) times daily between meals as needed. 5 each 6  . empagliflozin (JARDIANCE) 10 MG TABS tablet Take 1 tablet (10 mg total) by mouth daily before breakfast. 30 tablet 6  . fluticasone (FLONASE) 50 MCG/ACT nasal spray Place 2 sprays into both nostrils daily. 16 g 6  . gabapentin (NEURONTIN) 300 MG capsule Take 1 capsule (300 mg total) by mouth 3 (three) times daily. 270 capsule 3  . glipiZIDE (GLUCOTROL) 10 MG tablet Take 1 tablet (10 mg total) by mouth 2 (two) times daily before a meal. 180 tablet 3  . glucose blood (ACCU-CHEK GUIDE) test strip USE AS DIRECTED FOUR TIMES DAILY 100 strip 11  . insulin aspart (NOVOLOG FLEXPEN) 100 UNIT/ML FlexPen Inject 0-15 Units into the skin 3 (three) times daily with meals. CBG 121-150: 2u; CBG 151-200: 3u; CBG 201-250: 5u; CBG 251-300: 8u; CBG 301-350: 11u; CBG 351-400:15 u; CBG>400: 15u 45 mL 3  . insulin NPH-regular Human (70-30) 100 UNIT/ML injection Inject 38 Units into the skin 2 (two) times daily with a meal. 60 mL 12  . Insulin Pen Needle (TRUEPLUS 5-BEVEL PEN NEEDLES) 31G X 6 MM MISC 1 Syringe by Does not apply route as needed. 100 each 12  . isosorbide-hydrALAZINE (BIDIL) 20-37.5 MG tablet Take 2 tablets by mouth 3 (three) times daily. 180 tablet 11  . Magnesium Oxide  200 MG TABS Take 100 mg by mouth daily.    . meclizine (ANTIVERT) 25 MG tablet Take 1 tablet (25 mg total) by mouth 3 (three) times daily. 90 tablet 3  .  metFORMIN (GLUCOPHAGE) 1000 MG tablet Take 1 tablet (1,000 mg total) by mouth 2 (two) times daily with a meal. 60 tablet 11  . sacubitril-valsartan (ENTRESTO) 97-103 MG Take 1 tablet by mouth 2 (two) times daily. 180 tablet 3  . spironolactone (ALDACTONE) 25 MG tablet Take 0.5 tablets (12.5 mg total) by mouth daily. 45 tablet 6  . torsemide (DEMADEX) 20 MG tablet Take 2 tablets (40 mg total) by mouth daily. 60 tablet 6  . traZODone (DESYREL) 100 MG tablet Take 1 tablet (100 mg total) by mouth at bedtime. For sleep 90 tablet 3  . TRUEPLUS INSULIN SYRINGE 31G X 5/16" 0.5 ML MISC USE TO INJECT INSULIN TWICE DAILY. 100 each 11  . Vitamin D, Ergocalciferol, (DRISDOL) 1.25 MG (50000 UT) CAPS capsule Take 1 capsule (50,000 Units total) by mouth once a week. (Patient not taking: Reported on 10/28/2020) 5 capsule 6   No facility-administered medications prior to visit.    Allergies  Allergen Reactions  . Aspirin Shortness Of Breath, Itching and Rash     Burning sensation (Patient reports he tolerates other NSAIDS)   . Bee Venom Hives and Swelling  . Lisinopril Cough  . Tomato Rash    ROS Review of Systems  Constitutional: Negative.   HENT: Negative.   Eyes: Negative.   Respiratory: Negative.   Cardiovascular: Negative.   Gastrointestinal: Negative.   Endocrine: Negative.   Genitourinary: Negative.   Musculoskeletal: Positive for arthralgias (generalized joint pain).  Skin: Negative.   Allergic/Immunologic: Negative.   Neurological: Positive for dizziness (occasional ) and headaches (occasional ).  Hematological: Negative.   Psychiatric/Behavioral: Negative.    Objective:    Physical Exam Vitals and nursing note reviewed.  Constitutional:      Appearance: Normal appearance.  HENT:     Head: Normocephalic and atraumatic.     Nose:  Nose normal.     Mouth/Throat:     Mouth: Mucous membranes are moist.     Pharynx: Oropharynx is clear.  Cardiovascular:     Rate and Rhythm: Normal rate and regular rhythm.     Pulses: Normal pulses.     Heart sounds: Normal heart sounds.  Pulmonary:     Effort: Pulmonary effort is normal.     Breath sounds: Normal breath sounds.  Abdominal:     General: Bowel sounds are normal. There is distension (obese).     Palpations: Abdomen is soft.  Musculoskeletal:        General: Normal range of motion.     Cervical back: Normal range of motion and neck supple.  Skin:    General: Skin is warm and dry.  Neurological:     General: No focal deficit present.     Mental Status: He is alert and oriented to person, place, and time.  Psychiatric:        Mood and Affect: Mood normal.        Behavior: Behavior normal.        Thought Content: Thought content normal.        Judgment: Judgment normal.     There were no vitals taken for this visit. Wt Readings from Last 3 Encounters:  09/16/20 (!) 329 lb (149.2 kg)  09/04/20 (!) 330 lb 3.2 oz (149.8 kg)  08/07/20 (!) 328 lb (148.8 kg)     Health Maintenance Due  Topic Date Due  . Hepatitis C Screening  Never done  . COVID-19 Vaccine (1) Never done  . COLONOSCOPY (Pts 45-67yr Insurance coverage  will need to be confirmed)  Never done  . FOOT EXAM  11/22/2019  . URINE MICROALBUMIN  11/22/2019  . LIPID PANEL  03/16/2020  . INFLUENZA VACCINE  05/05/2020  . OPHTHALMOLOGY EXAM  07/18/2020  . HEMOGLOBIN A1C  09/23/2020    There are no preventive care reminders to display for this patient.  Lab Results  Component Value Date   TSH 2.980 09/04/2020   Lab Results  Component Value Date   WBC 8.0 09/04/2020   HGB 13.2 09/04/2020   HCT 41.2 09/04/2020   MCV 81 09/04/2020   PLT 308 09/04/2020   Lab Results  Component Value Date   NA 138 09/04/2020   K 4.2 09/04/2020   CO2 20 09/04/2020   GLUCOSE 273 (H) 09/04/2020   BUN 23  09/04/2020   CREATININE 1.46 (H) 09/04/2020   BILITOT 0.2 09/04/2020   ALKPHOS 74 09/04/2020   AST 17 09/04/2020   ALT 23 09/04/2020   PROT 7.2 09/04/2020   ALBUMIN 4.1 09/04/2020   CALCIUM 9.6 09/04/2020   ANIONGAP 10 05/17/2020   GFR 58.42 (L) 01/22/2015   Lab Results  Component Value Date   CHOL 125 03/17/2019   Lab Results  Component Value Date   HDL 34 (L) 03/17/2019   Lab Results  Component Value Date   LDLCALC 43 03/17/2019   Lab Results  Component Value Date   TRIG 240 (H) 03/17/2019   Lab Results  Component Value Date   CHOLHDL 3.7 03/17/2019   Lab Results  Component Value Date   HGBA1C 7.5 (A) 06/24/2020   HGBA1C 7.5 06/24/2020   HGBA1C 7.5 (A) 06/24/2020   HGBA1C 7.5 (A) 06/24/2020   Assessment & Plan:   1. Type 2 diabetes mellitus with stage 3 chronic kidney disease, with long-term current use of insulin, unspecified whether stage 3a or 3b CKD ([place]) He will continue medication as prescribed, to decrease foods/beverages high in sugars and carbs and follow Heart Healthy or DASH diet. Increase physical activity to at least 30 minutes cardio exercise daily. Monitor.  - Urinalysis - CBC with Differential - Hemoglobin A1c  2. Screening for colon cancer - Ambulatory referral to Gastroenterology  3. Hemoglobin A1C between 7% and 9% indicating borderline diabetic control Most recent Hgb A1c at 7.5. We will recheck today.   4. Essential hypertension He will continue to take medications as prescribed, to decrease high sodium intake, excessive alcohol intake, increase potassium intake, smoking cessation, and increase physical activity of at least 30 minutes of cardio activity daily. He will continue to follow Heart Healthy or DASH diet.  5. Blood in stool - Ambulatory referral to Gastroenterology  6. Healthcare maintenance - Vitamin D, 25-hydroxy - Vitamin B12 - Lipid Panel - TSH  7. Follow up He will follow up in 3 months for Annual Physical.  No  orders of the defined types were placed in this encounter.   Orders Placed This Encounter  Procedures  . Urinalysis  . CBC with Differential  . Hemoglobin A1c  . Vitamin D, 25-hydroxy  . Vitamin B12  . Lipid Panel  . TSH  . Ambulatory referral to Gastroenterology     Referral Orders     Ambulatory referral to Gastroenterology   Kathe Becton, MSN, ANE, FNP-BC [facility] [address] [place], [place] [address] [phone] fax     Problem List Items Addressed This Visit      Endocrine  Hemoglobin A1C between 7% and 9% indicating borderline diabetic control   Type 2 diabetes mellitus with stage 3 chronic kidney disease, with long-term current use of insulin (HCC) - Primary (Chronic)   Relevant Orders   Urinalysis   CBC with Differential   Hemoglobin A1c    Other Visit Diagnoses    Screening for colon cancer       Relevant Orders   Ambulatory referral to Gastroenterology   Essential hypertension       Blood in stool       Relevant Orders   Ambulatory referral to Gastroenterology   Healthcare maintenance       Relevant Orders   Vitamin D, 25-hydroxy   Vitamin B12   Lipid Panel   TSH   Follow up          No orders of the defined types were placed in this encounter.   Follow-up: No follow-ups on file.    [name], FNP

## 2020-10-29 ENCOUNTER — Other Ambulatory Visit: Payer: Self-pay | Admitting: Family Medicine

## 2020-10-29 DIAGNOSIS — E559 Vitamin D deficiency, unspecified: Secondary | ICD-10-CM

## 2020-10-29 LAB — VITAMIN B12: Vitamin B-12: 201 pg/mL — ABNORMAL LOW (ref 232–1245)

## 2020-10-29 LAB — URINALYSIS
Bilirubin, UA: NEGATIVE
Glucose, UA: NEGATIVE
Ketones, UA: NEGATIVE
Leukocytes,UA: NEGATIVE
Nitrite, UA: NEGATIVE
RBC, UA: NEGATIVE
Specific Gravity, UA: 1.018 (ref 1.005–1.030)
Urobilinogen, Ur: 0.2 mg/dL (ref 0.2–1.0)
pH, UA: 6 (ref 5.0–7.5)

## 2020-10-29 LAB — CBC WITH DIFFERENTIAL/PLATELET
Basophils Absolute: 0.1 10*3/uL (ref 0.0–0.2)
Basos: 1 %
EOS (ABSOLUTE): 0.2 10*3/uL (ref 0.0–0.4)
Eos: 3 %
Hematocrit: 39.8 % (ref 37.5–51.0)
Hemoglobin: 12.8 g/dL — ABNORMAL LOW (ref 13.0–17.7)
Immature Grans (Abs): 0 10*3/uL (ref 0.0–0.1)
Immature Granulocytes: 0 %
Lymphocytes Absolute: 2.9 10*3/uL (ref 0.7–3.1)
Lymphs: 42 %
MCH: 25.4 pg — ABNORMAL LOW (ref 26.6–33.0)
MCHC: 32.2 g/dL (ref 31.5–35.7)
MCV: 79 fL (ref 79–97)
Monocytes Absolute: 0.6 10*3/uL (ref 0.1–0.9)
Monocytes: 8 %
Neutrophils Absolute: 3.2 10*3/uL (ref 1.4–7.0)
Neutrophils: 46 %
Platelets: 292 10*3/uL (ref 150–450)
RBC: 5.03 x10E6/uL (ref 4.14–5.80)
RDW: 15.1 % (ref 11.6–15.4)
WBC: 7 10*3/uL (ref 3.4–10.8)

## 2020-10-29 LAB — LIPID PANEL
Chol/HDL Ratio: 5.1 ratio — ABNORMAL HIGH (ref 0.0–5.0)
Cholesterol, Total: 188 mg/dL (ref 100–199)
HDL: 37 mg/dL — ABNORMAL LOW (ref >39)
LDL Chol Calc (NIH): 107 mg/dL — ABNORMAL HIGH (ref 0–99)
Triglycerides: 255 mg/dL — ABNORMAL HIGH (ref 0–149)
VLDL Cholesterol Cal: 44 mg/dL — ABNORMAL HIGH (ref 5–40)

## 2020-10-29 LAB — HEMOGLOBIN A1C
Est. average glucose Bld gHb Est-mCnc: 200 mg/dL
Hgb A1c MFr Bld: 8.6 % — ABNORMAL HIGH (ref 4.8–5.6)

## 2020-10-29 LAB — VITAMIN D 25 HYDROXY (VIT D DEFICIENCY, FRACTURES): Vit D, 25-Hydroxy: 18.8 ng/mL — ABNORMAL LOW (ref 30.0–100.0)

## 2020-10-29 LAB — TSH: TSH: 4.12 u[IU]/mL (ref 0.450–4.500)

## 2020-10-29 MED ORDER — VITAMIN D (ERGOCALCIFEROL) 1.25 MG (50000 UNIT) PO CAPS: 50000.0000 [IU] | ORAL_CAPSULE | ORAL | 2 refills | Status: DC

## 2020-10-29 MED FILL — VIT D2 1.25 MG (50,000 UNIT: 1.25 MG | 35 days supply | Qty: 5 | Fill #0

## 2020-11-14 ENCOUNTER — Other Ambulatory Visit: Payer: Self-pay | Admitting: Family Medicine

## 2020-11-14 MED FILL — CARVEDILOL 6.25 MG TABLET: 6.25 | 30 days supply | Qty: 30 | Fill #2

## 2020-11-14 MED FILL — HUMULIN 70/30 VIAL: (70-30) 100 | 26 days supply | Qty: 20 | Fill #1

## 2020-11-14 MED FILL — MECLIZINE 25 MG TABLET: 25 | 30 days supply | Qty: 90 | Fill #1

## 2020-11-14 MED FILL — TORSEMIDE 20 MG TABLET: 20 | 30 days supply | Qty: 60 | Fill #1

## 2020-11-14 MED FILL — BIDIL 20-37.5 MG TABS: 20-37.5 | 30 days supply | Qty: 180 | Fill #1

## 2020-11-14 MED FILL — SPIRONOLACTONE 25 MG TABLET: 25 | 90 days supply | Qty: 45 | Fill #1

## 2020-11-15 ENCOUNTER — Other Ambulatory Visit: Payer: Self-pay | Admitting: Family Medicine

## 2020-11-15 MED FILL — NOVOLOG FLEXPEN SYRINGE: 100 | 33 days supply | Qty: 15 | Fill #0

## 2020-11-20 ENCOUNTER — Ambulatory Visit (INDEPENDENT_AMBULATORY_CARE_PROVIDER_SITE_OTHER): Payer: Medicaid Other

## 2020-11-20 DIAGNOSIS — I428 Other cardiomyopathies: Secondary | ICD-10-CM

## 2020-11-20 LAB — CUP PACEART REMOTE DEVICE CHECK
Battery Remaining Percentage: 96 %
Date Time Interrogation Session: 20220216124000
Implantable Lead Implant Date: 20160812
Implantable Lead Location: 753862
Implantable Lead Model: 3401
Implantable Pulse Generator Implant Date: 20210812
Pulse Gen Serial Number: 139514

## 2020-11-21 ENCOUNTER — Other Ambulatory Visit: Payer: Self-pay

## 2020-11-21 ENCOUNTER — Inpatient Hospital Stay (HOSPITAL_COMMUNITY)
Admission: EM | Admit: 2020-11-21 | Discharge: 2020-11-26 | DRG: 315 | Disposition: A | Discharge status: Home / Self Care | Payer: Medicaid Other | Attending: Internal Medicine | Admitting: Internal Medicine

## 2020-11-21 ENCOUNTER — Emergency Department (HOSPITAL_COMMUNITY): Payer: Medicaid Other

## 2020-11-21 DIAGNOSIS — E1140 Type 2 diabetes mellitus with diabetic neuropathy, unspecified: Secondary | ICD-10-CM | POA: Diagnosis present

## 2020-11-21 DIAGNOSIS — N179 Acute kidney failure, unspecified: Secondary | ICD-10-CM | POA: Diagnosis present

## 2020-11-21 DIAGNOSIS — N183 Chronic kidney disease, stage 3 unspecified: Secondary | ICD-10-CM | POA: Diagnosis present

## 2020-11-21 DIAGNOSIS — Z8249 Family history of ischemic heart disease and other diseases of the circulatory system: Secondary | ICD-10-CM

## 2020-11-21 DIAGNOSIS — Z6841 Body Mass Index (BMI) 40.0 and over, adult: Secondary | ICD-10-CM

## 2020-11-21 DIAGNOSIS — E1165 Type 2 diabetes mellitus with hyperglycemia: Secondary | ICD-10-CM | POA: Diagnosis not present

## 2020-11-21 DIAGNOSIS — E7800 Pure hypercholesterolemia, unspecified: Secondary | ICD-10-CM | POA: Diagnosis present

## 2020-11-21 DIAGNOSIS — Z85528 Personal history of other malignant neoplasm of kidney: Secondary | ICD-10-CM

## 2020-11-21 DIAGNOSIS — M109 Gout, unspecified: Secondary | ICD-10-CM | POA: Diagnosis present

## 2020-11-21 DIAGNOSIS — Z20822 Contact with and (suspected) exposure to covid-19: Secondary | ICD-10-CM | POA: Diagnosis present

## 2020-11-21 DIAGNOSIS — Z888 Allergy status to other drugs, medicaments and biological substances status: Secondary | ICD-10-CM

## 2020-11-21 DIAGNOSIS — F1210 Cannabis abuse, uncomplicated: Secondary | ICD-10-CM | POA: Diagnosis present

## 2020-11-21 DIAGNOSIS — I130 Hypertensive heart and chronic kidney disease with heart failure and stage 1 through stage 4 chronic kidney disease, or unspecified chronic kidney disease: Secondary | ICD-10-CM | POA: Diagnosis present

## 2020-11-21 DIAGNOSIS — Z95810 Presence of automatic (implantable) cardiac defibrillator: Secondary | ICD-10-CM

## 2020-11-21 DIAGNOSIS — I428 Other cardiomyopathies: Secondary | ICD-10-CM

## 2020-11-21 DIAGNOSIS — Z7984 Long term (current) use of oral hypoglycemic drugs: Secondary | ICD-10-CM

## 2020-11-21 DIAGNOSIS — E872 Acidosis, unspecified: Secondary | ICD-10-CM | POA: Diagnosis present

## 2020-11-21 DIAGNOSIS — I480 Paroxysmal atrial fibrillation: Secondary | ICD-10-CM | POA: Diagnosis present

## 2020-11-21 DIAGNOSIS — R42 Dizziness and giddiness: Secondary | ICD-10-CM

## 2020-11-21 DIAGNOSIS — I5042 Chronic combined systolic (congestive) and diastolic (congestive) heart failure: Secondary | ICD-10-CM | POA: Diagnosis present

## 2020-11-21 DIAGNOSIS — E860 Dehydration: Secondary | ICD-10-CM | POA: Diagnosis present

## 2020-11-21 DIAGNOSIS — F32A Depression, unspecified: Secondary | ICD-10-CM | POA: Diagnosis present

## 2020-11-21 DIAGNOSIS — I959 Hypotension, unspecified: Secondary | ICD-10-CM | POA: Diagnosis not present

## 2020-11-21 DIAGNOSIS — I5022 Chronic systolic (congestive) heart failure: Secondary | ICD-10-CM | POA: Diagnosis present

## 2020-11-21 DIAGNOSIS — Z79899 Other long term (current) drug therapy: Secondary | ICD-10-CM

## 2020-11-21 DIAGNOSIS — F17210 Nicotine dependence, cigarettes, uncomplicated: Secondary | ICD-10-CM | POA: Diagnosis present

## 2020-11-21 DIAGNOSIS — E1122 Type 2 diabetes mellitus with diabetic chronic kidney disease: Secondary | ICD-10-CM | POA: Diagnosis present

## 2020-11-21 DIAGNOSIS — Z886 Allergy status to analgesic agent status: Secondary | ICD-10-CM

## 2020-11-21 DIAGNOSIS — Z7901 Long term (current) use of anticoagulants: Secondary | ICD-10-CM

## 2020-11-21 DIAGNOSIS — Z794 Long term (current) use of insulin: Secondary | ICD-10-CM

## 2020-11-21 DIAGNOSIS — N1832 Chronic kidney disease, stage 3b: Secondary | ICD-10-CM | POA: Diagnosis present

## 2020-11-21 DIAGNOSIS — E785 Hyperlipidemia, unspecified: Secondary | ICD-10-CM | POA: Diagnosis present

## 2020-11-21 DIAGNOSIS — I10 Essential (primary) hypertension: Secondary | ICD-10-CM

## 2020-11-21 DIAGNOSIS — G4733 Obstructive sleep apnea (adult) (pediatric): Secondary | ICD-10-CM | POA: Diagnosis present

## 2020-11-21 DIAGNOSIS — Z833 Family history of diabetes mellitus: Secondary | ICD-10-CM

## 2020-11-21 DIAGNOSIS — Z91018 Allergy to other foods: Secondary | ICD-10-CM

## 2020-11-21 LAB — BASIC METABOLIC PANEL
Anion gap: 13 (ref 5–15)
BUN: 47 mg/dL — ABNORMAL HIGH (ref 6–20)
CO2: 18 mmol/L — ABNORMAL LOW (ref 22–32)
Calcium: 9.7 mg/dL (ref 8.9–10.3)
Chloride: 105 mmol/L (ref 98–111)
Creatinine, Ser: 2.42 mg/dL — ABNORMAL HIGH (ref 0.61–1.24)
GFR, Estimated: 33 mL/min — ABNORMAL LOW (ref >60)
Glucose, Bld: 213 mg/dL — ABNORMAL HIGH (ref 70–99)
Potassium: 4.3 mmol/L (ref 3.5–5.1)
Sodium: 136 mmol/L (ref 135–145)

## 2020-11-21 LAB — CBC
HCT: 43.8 % (ref 39.0–52.0)
Hemoglobin: 13.9 g/dL (ref 13.0–17.0)
MCH: 25.1 pg — ABNORMAL LOW (ref 26.0–34.0)
MCHC: 31.7 g/dL (ref 30.0–36.0)
MCV: 79.2 fL — ABNORMAL LOW (ref 80.0–100.0)
Platelets: 313 10*3/uL (ref 150–400)
RBC: 5.53 MIL/uL (ref 4.22–5.81)
RDW: 16.8 % — ABNORMAL HIGH (ref 11.5–15.5)
WBC: 7.7 10*3/uL (ref 4.0–10.5)
nRBC: 0 % (ref 0.0–0.2)

## 2020-11-21 LAB — LACTIC ACID, PLASMA: Lactic Acid, Venous: 3.3 mmol/L (ref 0.5–1.9)

## 2020-11-21 LAB — RESP PANEL BY RT-PCR (FLU A&B, COVID) ARPGX2
Influenza A by PCR: NEGATIVE
Influenza B by PCR: NEGATIVE
SARS Coronavirus 2 by RT PCR: NEGATIVE

## 2020-11-21 LAB — CBG MONITORING, ED: Glucose-Capillary: 215 mg/dL — ABNORMAL HIGH (ref 70–99)

## 2020-11-21 LAB — BRAIN NATRIURETIC PEPTIDE: B Natriuretic Peptide: 65 pg/mL (ref 0.0–100.0)

## 2020-11-21 MED ORDER — SODIUM CHLORIDE 0.9 % IV BOLUS
500.0000 mL | Freq: Once | INTRAVENOUS | Status: AC
  Administered 2020-11-21: 500 mL via INTRAVENOUS

## 2020-11-21 MED ORDER — LACTATED RINGERS IV BOLUS
1000.0000 mL | Freq: Once | INTRAVENOUS | Status: AC
  Administered 2020-11-21: 1000 mL via INTRAVENOUS

## 2020-11-21 NOTE — ED Notes (Signed)
Pt given water per provider.

## 2020-11-21 NOTE — ED Triage Notes (Signed)
Pt reports dizziness off and on for the last week. Pt reports has hx of HTN, CHF and diabetes. Pt reports sugars have been in check but intermittently feeling dizziness. Denies CP/ SOB. Pt reports has been taking meclizine with little relief of symptoms.

## 2020-11-21 NOTE — ED Provider Notes (Signed)
[facility] EMERGENCY DEPARTMENT Provider Note   CSN: [id number] Arrival date & time: 11/21/20  2142     History Chief Complaint  Patient presents with  . Dizziness  . Hypotension    Jeffrey [name] is a 45 y.o. male with h/o non-ischemic cardiomyopathy (LVEF 20-25%) s/p AICD, HTN, HLD, obesity, OSA on CPAP, previous RCC s/p resection, and T2DM who presents to the ED for lightheadedness and hypotension. Reports intermittent lightheadedness at home for the last several days. Worse with standing. No alleviating factors. No recent illnesses. No recent medication changes. Denies fever, chills, cough, SOB, chest pain, palpitations, N/V/D, abdominal pain, falls, or GI bleed.  The history is provided by the patient, the spouse and medical records.  Dizziness Quality:  Lightheadedness Severity:  Moderate Onset quality:  Gradual Duration:  3 days Timing:  Intermittent Progression:  Worsening Chronicity:  New Context: standing up   Relieved by:  Nothing Worsened by:  Standing up Ineffective treatments:  None tried Associated symptoms: no blood in stool, no chest pain, no diarrhea, no headaches, no nausea, no palpitations, no shortness of breath, no syncope, no tinnitus, no vision changes and no vomiting   Risk factors: heart disease and multiple medications        Past Medical History:  Diagnosis Date  . Anxiety   . Aspirin allergy   . Childhood asthma   . Chronic systolic CHF (congestive heart failure) ([place])    a. EF 20-25% in 2012. b. EF 45-50% in 10/2011 with nonischemic nuc - presumed NICM. c. 12/2014 Echo: Sev depressed LV fxn, sev dil LV, mild LVH, mild MR, sev dil LA, mildly reduced RV fxn.  . CKD (chronic kidney disease) stage 2, GFR 60-89 ml/min   . H/O vasectomy 12/2019  . High cholesterol   . Hypertension   . Morbid obesity ([place])   . Nephrolithiasis   . OSA on CPAP   . Presumed NICM    a. 04/2014 Myoview: EF 26%, glob HK, sev glob HK, ? prior infarct;   b. Never cathed 2/2 CKD.  [name] Renal cell carcinoma ([place])    a. s/p Rt robotic assisted partial converted to radical nephrectomy on 01/2013.  . Troponin level elevated    a. 04/2014, 12/2014: felt due to CHF.  . Type II diabetes mellitus ([place])   . Ventricular tachycardia ([place])    a. appropriate ICD therapy 12/2017    Patient Active Problem List   Diagnosis Date Noted  . Aspiration into airway   . ICD (implantable cardioverter-defibrillator) battery depletion 05/15/2020  . Endotracheal tube present   . Respiratory failure ([place])   . Acute encephalopathy   . Acute pulmonary edema (HCC)   . NICM (nonischemic cardiomyopathy) ([place]) 04/24/2020  . S/P vasectomy 12/20/2019  . Anxiety 12/20/2019  . Hemoglobin A1C between 7% and 9% indicating borderline diabetic control 12/20/2019  . Seasonal allergies 12/20/2019  . Insomnia 12/20/2019  . Vasectomy evaluation 06/20/2019  . Visual problems 06/20/2019  . Blurry vision, bilateral 06/20/2019  . Hyperglycemia 02/13/2019  . Hyperosmolar (nonketotic) coma ([place]) 01/19/2019  . AICD (automatic cardioverter/defibrillator) present 01/19/2019  . Hyperlipidemia 12/07/2018  . Follow-up exam 11/22/2018  . Class 3 severe obesity due to excess calories with serious comorbidity and body mass index (BMI) of 45.0 to 49.9 in adult ([place]) 11/22/2018  . Dizziness 11/22/2018  . Lingular pneumonia 08/04/2018  . Ventricular tachycardia ([place]) 12/30/2017  . PAF (paroxysmal atrial fibrillation) ([place])   . Dilated cardiomyopathy ([place])   .  Pollen allergy 02/17/2017  . Dyslipidemia 02/17/2017  . Hematochezia 11/08/2015  . CKD (chronic kidney disease), stage III ([place]) 06/20/2015  . Cardiac defibrillator in situ   . Chronic systolic CHF (congestive heart failure) ([place]) 03/11/2015  . Gouty arthritis 03/11/2015  . Chest pain 12/28/2014  . Aspirin allergy 04/08/2014  . Renal cell carcinoma ([place])   . Gout attack 11/10/2011  . Morbid obesity ([place]) 10/23/2011  . OSA  (obstructive sleep apnea) 10/23/2011  . Type 2 diabetes mellitus with stage 3 chronic kidney disease, with long-term current use of insulin ([place]) 10/22/2011  . Hyperlipidemia associated with type 2 diabetes mellitus ([place]) 12/25/2010  . Hypertension associated with diabetes ([place]) 12/25/2010    Past Surgical History:  Procedure Laterality Date  . APPENDECTOMY  07/2004  . CARDIAC CATHETERIZATION N/A 05/17/2015   Procedure: Right/Left Heart Cath and Coronary Angiography;  Surgeon: Jolaine Artist, MD;  Location: [facility];  Service: Cardiovascular;  Laterality: N/A;  . EP IMPLANTABLE DEVICE N/A 05/17/2015   Procedure: SubQ ICD Implant;  Surgeon: Will Meredith Leeds, MD;  Location: [facility];  Service: Cardiovascular;  Laterality: N/A;  . ROBOTIC ASSISTED LAPAROSCOPIC LYSIS OF ADHESION  01/13/2013   Procedure: ROBOTIC ASSISTED LAPAROSCOPIC LYSIS OF ADHESION EXTENSIVE;  Surgeon: Alexis Frock, MD;  Location: [facility];  Service: Urology;;  . ROBOTIC ASSITED PARTIAL NEPHRECTOMY Right 01/13/2013   Procedure: ROBOTIC ASSITED PARTIAL NEPHRECTOMY CONVERTED TO ROBOTIC ASSISTED RIGHT RADICAL NEPHRECTOMY;  Surgeon: Alexis Frock, MD;  Location: [facility];  Service: Urology;  Laterality: Right;  . SUBQ ICD CHANGEOUT N/A 05/16/2020   Procedure: SUBQ ICD CHANGEOUT;  Surgeon: [name], MD;  Location: [facility];  Service: Cardiovascular;  Laterality: N/A;  . SUBQ ICD CHANGEOUT N/A 05/15/2020   Procedure: FHLK ICD CHANGEOUT;  Surgeon: [name], MD;  Location: [facility];  Service: Cardiovascular;  Laterality: N/A;  . VASECTOMY         Family History  Problem Relation Age of Onset  . Hypertension Mother   . Diabetes Maternal Aunt   . Heart attack Neg Hx   . Stroke Neg Hx     Social History   Tobacco Use  . Smoking status: Former Smoker    Packs/day: 0.25    Years: 22.00    Pack years: 5.50    Quit date: 04/16/2015    Years since quitting: 5.6  .  Smokeless tobacco: Never Used  Vaping Use  . Vaping Use: Never used  Substance Use Topics  . Alcohol use: No    Comment: occ   . Drug use: Yes    Frequency: 14.0 times per week    Types: Marijuana    Comment: uses marijuana twice a day    Home Medications Prior to Admission medications   Medication Sig Start Date End Date Taking? Authorizing Provider  Accu-Chek FastClix Lancets MISC USE AS DIRECTED FOUR TIMES DAILY 12/18/19   [name], FNP  acetaminophen (TYLENOL) 325 MG tablet Take 2 tablets (650 mg total) by mouth every 4 (four) hours as needed for mild pain, moderate pain, fever or headache. 05/17/20   Shirley Friar, PA-C  allopurinol (ZYLOPRIM) 100 MG tablet Take 1 tablet (100 mg total) by mouth daily. 06/27/20   [name], FNP  amiodarone (PACERONE) 100 MG tablet Take 1 tablet (100 mg total) by mouth daily. 06/26/20   Bensimhon, Shaune Pascal, MD  apixaban (ELIQUIS) 5 MG TABS tablet Take 2 tablets (  10 mg total) by mouth 2 (two) times daily for 7 days, THEN 1 tablet (5 mg total) 2 (two) times daily. 06/06/20 06/13/21  Bensimhon, Shaune Pascal, MD  blood glucose meter kit and supplies Dispense based on patient and insurance preference. Use up to four times daily as directed. (FOR ICD-10 E10.9, E11.9). 01/23/19   [name], FNP  busPIRone (BUSPAR) 10 MG tablet Take 1 tablet (10 mg total) by mouth 2 (two) times daily. 06/24/20   [name], FNP  carvedilol (COREG) 6.25 MG tablet Take 1 tablet (6.25 mg total) by mouth 2 (two) times daily. 08/07/20   Bensimhon, Shaune Pascal, MD  cetirizine (ZYRTEC) 10 MG tablet Take 1 tablet (10 mg total) by mouth daily. 06/27/20   [name], FNP  Colchicine (MITIGARE) 0.6 MG CAPS Take 1 tablet by mouth 2 (two) times daily as needed (gout). 06/27/20   [name], FNP  Continuous Blood Gluc Receiver (FREESTYLE LIBRE 14 DAY READER) DEVI 1 each by Does not apply route 3 (three) times daily between meals as needed. 03/19/20   [name], FNP  Continuous Blood Gluc Sensor (GUARDIAN SENSOR 3) MISC 1 Device by Does not apply route 3 (three) times daily between meals as needed. 03/25/20   [name], FNP  empagliflozin (JARDIANCE) 10 MG TABS tablet Take 1 tablet (10 mg total) by mouth daily before breakfast. 09/16/20   Bensimhon, Shaune Pascal, MD  fluticasone (FLONASE) 50 MCG/ACT nasal spray Place 2 sprays into both nostrils daily. 09/18/19   [name], FNP  gabapentin (NEURONTIN) 300 MG capsule Take 1 capsule (300 mg total) by mouth 3 (three) times daily. 06/27/20   [name], FNP  glipiZIDE (GLUCOTROL) 10 MG tablet Take 1 tablet (10 mg total) by mouth 2 (two) times daily before a meal. 06/27/20   [name], FNP  glucose blood (ACCU-CHEK GUIDE) test strip USE AS DIRECTED FOUR TIMES DAILY 12/18/19   [name], FNP  insulin NPH-regular Human (70-30) 100 UNIT/ML injection Inject 38 Units into the skin 2 (two) times daily with a meal. 06/27/20   [name], FNP  Insulin Pen Needle (TRUEPLUS 5-BEVEL PEN NEEDLES) 31G X 6 MM MISC 1 Syringe by Does not apply route as needed. 05/10/19   [name], FNP  isosorbide-hydrALAZINE (BIDIL) 20-37.5 MG tablet Take 2 tablets by mouth 3 (three) times daily. 08/07/20   Bensimhon, Shaune Pascal, MD  Magnesium Oxide 200 MG TABS Take 100 mg by mouth daily.    [provider]  meclizine (ANTIVERT) 25 MG tablet Take 1 tablet (25 mg total) by mouth 3 (three) times daily. 06/27/20   [name], FNP  metFORMIN (GLUCOPHAGE) 1000 MG tablet Take 1 tablet (1,000 mg total) by mouth 2 (two) times daily with a meal. 06/24/20   [name], FNP  NOVOLOG FLEXPEN 100 UNIT/ML FlexPen INJECT 0-15 UNIT(S) INTO THE SKIN THREE TIMES DAILY WITH MEALS VIA SLIDING SCALE 11/15/20   [name], FNP  sacubitril-valsartan (ENTRESTO) 97-103 MG Take 1 tablet by mouth 2 (two) times daily. 06/27/20   [name], FNP  spironolactone (ALDACTONE) 25 MG tablet Take  0.5 tablets (12.5 mg total) by mouth daily. 06/27/20 03/19/22  [name], FNP  torsemide (DEMADEX) 20 MG tablet Take 2 tablets (40 mg total) by mouth daily. 06/27/20   [name], FNP  traZODone (DESYREL) 100 MG tablet Take 1 tablet (100 mg total) by mouth  at bedtime. For sleep 06/27/20   [name], FNP  TRUEPLUS INSULIN SYRINGE 31G X 5/16" 0.5 ML MISC USE TO INJECT INSULIN TWICE DAILY. 09/17/20   [name], FNP  Vitamin D, Ergocalciferol, (DRISDOL) 1.25 MG (50000 UNIT) CAPS capsule Take 1 capsule (50,000 Units total) by mouth once a week. 10/29/20   [name], FNP    Allergies    Aspirin, Bee venom, Lisinopril, and Tomato  Review of Systems   Review of Systems  Constitutional: Negative for chills and fever.  HENT: Negative for ear pain, sore throat and tinnitus.   Eyes: Negative for pain and visual disturbance.  Respiratory: Negative for cough and shortness of breath.   Cardiovascular: Negative for chest pain, palpitations and syncope.  Gastrointestinal: Negative for abdominal pain, blood in stool, diarrhea, nausea and vomiting.  Genitourinary: Negative for dysuria and hematuria.  Musculoskeletal: Negative for arthralgias and back pain.  Skin: Negative for color change and rash.  Neurological: Positive for dizziness and light-headedness. Negative for seizures, syncope and headaches.  All other systems reviewed and are negative.   Physical Exam Updated Vital Signs BP (!) 86/60   Pulse 70   Temp (!) 97.5 F (36.4 C) (Oral)   Resp (!) 21   Ht '5\' 7"'  (1.702 m)   Wt (!) 154.2 kg   SpO2 96%   BMI 53.25 kg/m   Physical Exam Vitals and nursing note reviewed.  Constitutional:      General: He is awake. He is not in acute distress.    Appearance: Normal appearance. He is well-developed and well-nourished. He is obese. He is not ill-appearing.  HENT:     Head: Normocephalic and atraumatic.     Right Ear: External ear normal.     Left Ear: External  ear normal.     Nose: Nose normal.     Mouth/Throat:     Mouth: Mucous membranes are dry.     Pharynx: Oropharynx is clear. No oropharyngeal exudate or posterior oropharyngeal erythema.  Eyes:     General: No scleral icterus.       Right eye: No discharge.        Left eye: No discharge.     Conjunctiva/sclera: Conjunctivae normal.  Cardiovascular:     Rate and Rhythm: Normal rate and regular rhythm.     Pulses:          Radial pulses are 1+ on the right side and 1+ on the left side.     Heart sounds: Normal heart sounds. No murmur heard.   Pulmonary:     Effort: Pulmonary effort is normal. No respiratory distress.     Breath sounds: Normal breath sounds. No wheezing, rhonchi or rales.  Abdominal:     General: Abdomen is flat. There is no distension.     Palpations: Abdomen is soft.     Tenderness: There is no abdominal tenderness. There is no guarding or rebound.  Musculoskeletal:        General: No signs of injury or edema.     Cervical back: Neck supple.     Right lower leg: No edema.     Left lower leg: No edema.  Skin:    General: Skin is warm and dry.     Findings: No rash.  Neurological:     General: No focal deficit present.     Mental Status: He is alert and oriented to person, place, and time.     Sensory: No sensory deficit.  Motor: No weakness.  Psychiatric:        Mood and Affect: Mood and affect and mood normal.        Behavior: Behavior normal. Behavior is cooperative.     ED Results / Procedures / Treatments   Labs (all labs ordered are listed, but only abnormal results are displayed) Labs Reviewed  BASIC METABOLIC PANEL - Abnormal; Notable for the following components:      Result Value   CO2 18 (*)    Glucose, Bld 213 (*)    BUN 47 (*)    Creatinine, Ser 2.42 (*)    GFR, Estimated 33 (*)    All other components within normal limits  CBC - Abnormal; Notable for the following components:   MCV 79.2 (*)    MCH 25.1 (*)    RDW 16.8 (*)    All  other components within normal limits  LACTIC ACID, PLASMA - Abnormal; Notable for the following components:   Lactic Acid, Venous 3.3 (*)    All other components within normal limits  CBG MONITORING, ED - Abnormal; Notable for the following components:   Glucose-Capillary 215 (*)    All other components within normal limits  RESP PANEL BY RT-PCR (FLU A&B, COVID) [id number]  BRAIN NATRIURETIC PEPTIDE  URINALYSIS, ROUTINE W REFLEX MICROSCOPIC  LACTIC ACID, PLASMA  TROPONIN I (HIGH SENSITIVITY)    EKG EKG Interpretation  Date/Time:  Thursday November 21 2020 21:54:24 EST Ventricular Rate:  64 PR Interval:  186 QRS Duration: 92 QT Interval:  404 QTC Calculation: 416 R Axis:   97 Text Interpretation: Sinus rhythm with occasional Premature ventricular complexes Possible Left atrial enlargement Rightward axis T wave abnormality Abnormal ECG Confirmed by Carmin Muskrat [phone]) on 11/21/2020 10:21:04 PM   Radiology DG Chest Portable 1 View  Result Date: 11/21/2020 CLINICAL DATA:  Hypotension, dizziness EXAM: PORTABLE CHEST 1 VIEW COMPARISON:  Chest x-ray 05/17/2020, CT chest 12/28/2014 FINDINGS: Left chest for cardiac defibrillator in similar position. Cardiomegaly. The heart size and mediastinal contours are unchanged. Streaky airspace opacity at the right base. No focal consolidation. No pulmonary edema. No pleural effusion. No pneumothorax. No acute osseous abnormality. IMPRESSION: 1. Streaky airspace opacity at the right base that likely represents atelectasis. 2. Stable cardiomegaly. Electronically Signed   By: Iven Finn M.D.   On: 11/21/2020 22:43   CUP PACEART REMOTE DEVICE CHECK  Result Date: 11/20/2020 Scheduled remote reviewed. Normal device function.  Next remote 91 days. Kathy Breach, RN, CCDS, CV Remote SolutionsSensing Configuration: Secondary Gain Setting: 1X Post Shock Pacing: OFF   Procedures Procedures  Medications Ordered in ED Medications  lactated ringers  bolus 1,000 mL (1,000 mLs Intravenous New Bag/Given 11/21/20 2235)  sodium chloride 0.9 % bolus 500 mL (500 mLs Intravenous New Bag/Given 11/21/20 2356)    ED Course  I have reviewed the triage vital signs and the nursing notes.  Pertinent labs & imaging results that were available during my care of the patient were reviewed by me and considered in my medical decision making (see chart for details).    MDM Rules/Calculators/A&P                          Patient is a 52yoM with history and physical as described above who presents to the ED for symptomatic hypotension. VS notable for SBP 80s. Other VS reassuring. He appears well on exam and in no acute distress. Patient afebrile without leukocytosis; doubt infectious  etiology at this time. BNP wnl and patient does not appear to be fluid volume overloaded, not consistent with cardiogenic shock. HP and physical exam no consistent with anaphylaxis. Labs notable for AKI and lactic acidosis. Suspect AKI likely prerenal 2/2 dehydration. CXR reassuring for tension PTX or widened mediastinum. Also suspect a component polypharmacy given patient taking multiple vasoactive medications, including Coreg, isosorbide, hydralazine, Entresto, and torsemide. Treatment includes IVF bolus with moderate improvement of SBP to 90s. Plan will be to given additional IVF and reassess blood pressure. Patient will need admission for further evaluation of hypotension.  Transfer of care at 2345 to [facility] Please see her note for further MDM and ED course. Patient in stable condition at time of transfer.  Final Clinical Impression(s) / ED Diagnoses Final diagnoses:  Lightheadedness  Hypotension, unspecified hypotension type    Rx / DC Orders ED Discharge Orders    None       Christy Gentles, MD 11/22/20 0014    Carmin Muskrat, MD 11/29/20 2115

## 2020-11-21 NOTE — ED Notes (Signed)
Critical lab result given to provider - lactic 3.3

## 2020-11-21 NOTE — ED Provider Notes (Incomplete)
[facility] EMERGENCY DEPARTMENT Provider Note   CSN: [id number] Arrival date & time: 11/21/20  2142     History Chief Complaint  Patient presents with  . Dizziness  . Hypotension    Jeffrey [name] is a 45 y.o. male with h/o   HPI     Past Medical History:  Diagnosis Date  . Anxiety   . Aspirin allergy   . Childhood asthma   . Chronic systolic CHF (congestive heart failure) ([place])    a. EF 20-25% in 2012. b. EF 45-50% in 10/2011 with nonischemic nuc - presumed NICM. c. 12/2014 Echo: Sev depressed LV fxn, sev dil LV, mild LVH, mild MR, sev dil LA, mildly reduced RV fxn.  . CKD (chronic kidney disease) stage 2, GFR 60-89 ml/min   . H/O vasectomy 12/2019  . High cholesterol   . Hypertension   . Morbid obesity ([place])   . Nephrolithiasis   . OSA on CPAP   . Presumed NICM    a. 04/2014 Myoview: EF 26%, glob HK, sev glob HK, ? prior infarct;  b. Never cathed 2/2 CKD.  [name] Renal cell carcinoma ([place])    a. s/p Rt robotic assisted partial converted to radical nephrectomy on 01/2013.  . Troponin level elevated    a. 04/2014, 12/2014: felt due to CHF.  . Type II diabetes mellitus ([place])   . Ventricular tachycardia ([place])    a. appropriate ICD therapy 12/2017    Patient Active Problem List   Diagnosis Date Noted  . Aspiration into airway   . ICD (implantable cardioverter-defibrillator) battery depletion 05/15/2020  . Endotracheal tube present   . Respiratory failure ([place])   . Acute encephalopathy   . Acute pulmonary edema (HCC)   . NICM (nonischemic cardiomyopathy) ([place]) 04/24/2020  . S/P vasectomy 12/20/2019  . Anxiety 12/20/2019  . Hemoglobin A1C between 7% and 9% indicating borderline diabetic control 12/20/2019  . Seasonal allergies 12/20/2019  . Insomnia 12/20/2019  . Vasectomy evaluation 06/20/2019  . Visual problems 06/20/2019  . Blurry vision, bilateral 06/20/2019  . Hyperglycemia 02/13/2019  . Hyperosmolar (nonketotic) coma ([place]) 01/19/2019  . AICD  (automatic cardioverter/defibrillator) present 01/19/2019  . Hyperlipidemia 12/07/2018  . Follow-up exam 11/22/2018  . Class 3 severe obesity due to excess calories with serious comorbidity and body mass index (BMI) of 45.0 to 49.9 in adult ([place]) 11/22/2018  . Dizziness 11/22/2018  . Lingular pneumonia 08/04/2018  . Ventricular tachycardia ([place]) 12/30/2017  . PAF (paroxysmal atrial fibrillation) ([place])   . Dilated cardiomyopathy ([place])   . Pollen allergy 02/17/2017  . Dyslipidemia 02/17/2017  . Hematochezia 11/08/2015  . CKD (chronic kidney disease), stage III ([place]) 06/20/2015  . Cardiac defibrillator in situ   . Chronic systolic CHF (congestive heart failure) ([place]) 03/11/2015  . Gouty arthritis 03/11/2015  . Chest pain 12/28/2014  . Aspirin allergy 04/08/2014  . Renal cell carcinoma ([place])   . Gout attack 11/10/2011  . Morbid obesity ([place]) 10/23/2011  . OSA (obstructive sleep apnea) 10/23/2011  . Type 2 diabetes mellitus with stage 3 chronic kidney disease, with long-term current use of insulin ([place]) 10/22/2011  . Hyperlipidemia associated with type 2 diabetes mellitus ([place]) 12/25/2010  . Hypertension associated with diabetes ([place]) 12/25/2010    Past Surgical History:  Procedure Laterality Date  . APPENDECTOMY  07/2004  . CARDIAC CATHETERIZATION N/A 05/17/2015   Procedure: Right/Left Heart Cath and Coronary Angiography;  Surgeon: Jolaine Artist, MD;  Location: [place] [facility];  Service:  Cardiovascular;  Laterality: N/A;  . EP IMPLANTABLE DEVICE N/A 05/17/2015   Procedure: SubQ ICD Implant;  Surgeon: Will Meredith Leeds, MD;  Location: [place] [facility];  Service: Cardiovascular;  Laterality: N/A;  . ROBOTIC ASSISTED LAPAROSCOPIC LYSIS OF ADHESION  01/13/2013   Procedure: ROBOTIC ASSISTED LAPAROSCOPIC LYSIS OF ADHESION EXTENSIVE;  Surgeon: Alexis Frock, MD;  Location: [facility];  Service: Urology;;  . ROBOTIC ASSITED PARTIAL NEPHRECTOMY Right 01/13/2013   Procedure: ROBOTIC  ASSITED PARTIAL NEPHRECTOMY CONVERTED TO ROBOTIC ASSISTED RIGHT RADICAL NEPHRECTOMY;  Surgeon: Alexis Frock, MD;  Location: [facility];  Service: Urology;  Laterality: Right;  . SUBQ ICD CHANGEOUT N/A 05/16/2020   Procedure: SUBQ ICD CHANGEOUT;  Surgeon: [name], MD;  Location: [place] [facility];  Service: Cardiovascular;  Laterality: N/A;  . SUBQ ICD CHANGEOUT N/A 05/15/2020   Procedure: AOZH ICD CHANGEOUT;  Surgeon: [name], MD;  Location: [place] [facility];  Service: Cardiovascular;  Laterality: N/A;  . VASECTOMY         Family History  Problem Relation Age of Onset  . Hypertension Mother   . Diabetes Maternal Aunt   . Heart attack Neg Hx   . Stroke Neg Hx     Social History   Tobacco Use  . Smoking status: Former Smoker    Packs/day: 0.25    Years: 22.00    Pack years: 5.50    Quit date: 04/16/2015    Years since quitting: 5.6  . Smokeless tobacco: Never Used  Vaping Use  . Vaping Use: Never used  Substance Use Topics  . Alcohol use: No    Comment: occ   . Drug use: Yes    Frequency: 14.0 times per week    Types: Marijuana    Comment: uses marijuana twice a day    Home Medications Prior to Admission medications   Medication Sig Start Date End Date Taking? Authorizing Provider  Accu-Chek FastClix Lancets MISC USE AS DIRECTED FOUR TIMES DAILY 12/18/19   [name], FNP  acetaminophen (TYLENOL) 325 MG tablet Take 2 tablets (650 mg total) by mouth every 4 (four) hours as needed for mild pain, moderate pain, fever or headache. 05/17/20   Shirley Friar, PA-C  allopurinol (ZYLOPRIM) 100 MG tablet Take 1 tablet (100 mg total) by mouth daily. 06/27/20   [name], FNP  amiodarone (PACERONE) 100 MG tablet Take 1 tablet (100 mg total) by mouth daily. 06/26/20   Bensimhon, Shaune Pascal, MD  apixaban (ELIQUIS) 5 MG TABS tablet Take 2 tablets (10 mg total) by mouth 2 (two) times daily for 7 days, THEN 1 tablet (5 mg total) 2 (two) times daily.  06/06/20 06/13/21  Bensimhon, Shaune Pascal, MD  blood glucose meter kit and supplies Dispense based on patient and insurance preference. Use up to four times daily as directed. (FOR ICD-10 E10.9, E11.9). 01/23/19   [name], FNP  busPIRone (BUSPAR) 10 MG tablet Take 1 tablet (10 mg total) by mouth 2 (two) times daily. 06/24/20   [name], FNP  carvedilol (COREG) 6.25 MG tablet Take 1 tablet (6.25 mg total) by mouth 2 (two) times daily. 08/07/20   Bensimhon, Shaune Pascal, MD  cetirizine (ZYRTEC) 10 MG tablet Take 1 tablet (10 mg total) by mouth daily. 06/27/20   [name], FNP  Colchicine (MITIGARE) 0.6 MG CAPS Take 1 tablet by mouth 2 (two) times daily as needed (gout). 06/27/20   [name], FNP  Continuous  Blood Gluc Receiver (FREESTYLE LIBRE 14 DAY READER) DEVI 1 each by Does not apply route 3 (three) times daily between meals as needed. 03/19/20   [name], FNP  Continuous Blood Gluc Sensor (GUARDIAN SENSOR 3) MISC 1 Device by Does not apply route 3 (three) times daily between meals as needed. 03/25/20   [name], FNP  empagliflozin (JARDIANCE) 10 MG TABS tablet Take 1 tablet (10 mg total) by mouth daily before breakfast. 09/16/20   Bensimhon, Shaune Pascal, MD  fluticasone (FLONASE) 50 MCG/ACT nasal spray Place 2 sprays into both nostrils daily. 09/18/19   [name], FNP  gabapentin (NEURONTIN) 300 MG capsule Take 1 capsule (300 mg total) by mouth 3 (three) times daily. 06/27/20   [name], FNP  glipiZIDE (GLUCOTROL) 10 MG tablet Take 1 tablet (10 mg total) by mouth 2 (two) times daily before a meal. 06/27/20   [name], FNP  glucose blood (ACCU-CHEK GUIDE) test strip USE AS DIRECTED FOUR TIMES DAILY 12/18/19   [name], FNP  insulin NPH-regular Human (70-30) 100 UNIT/ML injection Inject 38 Units into the skin 2 (two) times daily with a meal. 06/27/20   [name], FNP  Insulin Pen Needle (TRUEPLUS 5-BEVEL PEN NEEDLES) 31G X 6 MM  MISC 1 Syringe by Does not apply route as needed. 05/10/19   [name], FNP  isosorbide-hydrALAZINE (BIDIL) 20-37.5 MG tablet Take 2 tablets by mouth 3 (three) times daily. 08/07/20   Bensimhon, Shaune Pascal, MD  Magnesium Oxide 200 MG TABS Take 100 mg by mouth daily.    [provider]  meclizine (ANTIVERT) 25 MG tablet Take 1 tablet (25 mg total) by mouth 3 (three) times daily. 06/27/20   [name], FNP  metFORMIN (GLUCOPHAGE) 1000 MG tablet Take 1 tablet (1,000 mg total) by mouth 2 (two) times daily with a meal. 06/24/20   [name], FNP  NOVOLOG FLEXPEN 100 UNIT/ML FlexPen INJECT 0-15 UNIT(S) INTO THE SKIN THREE TIMES DAILY WITH MEALS VIA SLIDING SCALE 11/15/20   [name], FNP  sacubitril-valsartan (ENTRESTO) 97-103 MG Take 1 tablet by mouth 2 (two) times daily. 06/27/20   [name], FNP  spironolactone (ALDACTONE) 25 MG tablet Take 0.5 tablets (12.5 mg total) by mouth daily. 06/27/20 03/19/22  [name], FNP  torsemide (DEMADEX) 20 MG tablet Take 2 tablets (40 mg total) by mouth daily. 06/27/20   [name], FNP  traZODone (DESYREL) 100 MG tablet Take 1 tablet (100 mg total) by mouth at bedtime. For sleep 06/27/20   [name], FNP  TRUEPLUS INSULIN SYRINGE 31G X 5/16" 0.5 ML MISC USE TO INJECT INSULIN TWICE DAILY. 09/17/20   [name], FNP  Vitamin D, Ergocalciferol, (DRISDOL) 1.25 MG (50000 UNIT) CAPS capsule Take 1 capsule (50,000 Units total) by mouth once a week. 10/29/20   [name], FNP    Allergies    Aspirin, Bee venom, Lisinopril, and Tomato  Review of Systems   Review of Systems  Physical Exam Updated Vital Signs BP (!) 86/60   Pulse 70   Temp (!) 97.5 F (36.4 C) (Oral)   Resp (!) 21   Ht _0  (1.702 m)   Wt (!) 154.2 kg   SpO2 96%   BMI 53.25 kg/m   Physical Exam  ED Results / Procedures / Treatments   Labs (all labs ordered are listed, but only abnormal results are displayed) Labs  Reviewed  BASIC METABOLIC PANEL - Abnormal; Notable for the following components:      Result Value   CO2 18 (*)    Glucose, Bld 213 (*)    BUN 47 (*)    Creatinine, Ser 2.42 (*)    GFR, Estimated 33 (*)    All other components within normal limits  CBC - Abnormal; Notable for the following components:   MCV 79.2 (*)    MCH 25.1 (*)    RDW 16.8 (*)    All other components within normal limits  LACTIC ACID, PLASMA - Abnormal; Notable for the following components:   Lactic Acid, Venous 3.3 (*)    All other components within normal limits  CBG MONITORING, ED - Abnormal; Notable for the following components:   Glucose-Capillary 215 (*)    All other components within normal limits  RESP PANEL BY RT-PCR (FLU A&B, COVID) ARPGX2  BRAIN NATRIURETIC PEPTIDE  URINALYSIS, ROUTINE W REFLEX MICROSCOPIC  LACTIC ACID, PLASMA  TROPONIN I (HIGH SENSITIVITY)    EKG EKG Interpretation  Date/Time:  Thursday November 21 2020 21:54:24 EST Ventricular Rate:  64 PR Interval:  186 QRS Duration: 92 QT Interval:  404 QTC Calculation: 416 R Axis:   97 Text Interpretation: Sinus rhythm with occasional Premature ventricular complexes Possible Left atrial enlargement Rightward axis T wave abnormality Abnormal ECG Confirmed by Carmin Muskrat [phone]) on 11/21/2020 10:21:04 PM   Radiology DG Chest Portable 1 View  Result Date: 11/21/2020 CLINICAL DATA:  Hypotension, dizziness EXAM: PORTABLE CHEST 1 VIEW COMPARISON:  Chest x-ray 05/17/2020, CT chest 12/28/2014 FINDINGS: Left chest for cardiac defibrillator in similar position. Cardiomegaly. The heart size and mediastinal contours are unchanged. Streaky airspace opacity at the right base. No focal consolidation. No pulmonary edema. No pleural effusion. No pneumothorax. No acute osseous abnormality. IMPRESSION: 1. Streaky airspace opacity at the right base that likely represents atelectasis. 2. Stable cardiomegaly. Electronically Signed   By: Iven Finn  M.D.   On: 11/21/2020 22:43   CUP PACEART REMOTE DEVICE CHECK  Result Date: 11/20/2020 Scheduled remote reviewed. Normal device function.  Next remote 91 days. Kathy Breach, RN, CCDS, CV Remote SolutionsSensing Configuration: Secondary Gain Setting: 1X Post Shock Pacing: OFF   Procedures Procedures  Medications Ordered in ED Medications  lactated ringers bolus 1,000 mL (1,000 mLs Intravenous New Bag/Given 11/21/20 2235)  sodium chloride 0.9 % bolus 500 mL (500 mLs Intravenous New Bag/Given 11/21/20 2356)    ED Course  I have reviewed the triage vital signs and the nursing notes.  Pertinent labs & imaging results that were available during my care of the patient were reviewed by me and considered in my medical decision making (see chart for details).    MDM Rules/Calculators/A&P                          Patient is a 3yoM with history and physical as described above who presents to the ED for symptomatic hypotension. VS notable for SBP 80s. Other VS reassuring. He appears well on exam and in no acute distress. Patient afebrile without leukocytosis; doubt infectious etiology at this time. BNP wnl;   Currently taking Coreg, isosorbide, hydralazine, Entresto, and torsemide.  Transfer of care at 2345 to [facility] Please see her note for further MDM and ED course. Patient in stable condition at time of transfer.  Final Clinical Impression(s) / ED Diagnoses Final diagnoses:  None    Rx / DC Orders  ED Discharge Orders    None

## 2020-11-22 ENCOUNTER — Encounter (HOSPITAL_COMMUNITY): Payer: Self-pay | Admitting: Internal Medicine

## 2020-11-22 ENCOUNTER — Other Ambulatory Visit: Payer: Self-pay

## 2020-11-22 DIAGNOSIS — G4733 Obstructive sleep apnea (adult) (pediatric): Secondary | ICD-10-CM | POA: Diagnosis not present

## 2020-11-22 DIAGNOSIS — N1832 Chronic kidney disease, stage 3b: Secondary | ICD-10-CM | POA: Diagnosis present

## 2020-11-22 DIAGNOSIS — E1122 Type 2 diabetes mellitus with diabetic chronic kidney disease: Secondary | ICD-10-CM

## 2020-11-22 DIAGNOSIS — F1210 Cannabis abuse, uncomplicated: Secondary | ICD-10-CM | POA: Diagnosis present

## 2020-11-22 DIAGNOSIS — I5042 Chronic combined systolic (congestive) and diastolic (congestive) heart failure: Secondary | ICD-10-CM | POA: Diagnosis not present

## 2020-11-22 DIAGNOSIS — M109 Gout, unspecified: Secondary | ICD-10-CM | POA: Diagnosis present

## 2020-11-22 DIAGNOSIS — N1831 Chronic kidney disease, stage 3a: Secondary | ICD-10-CM

## 2020-11-22 DIAGNOSIS — E872 Acidosis, unspecified: Secondary | ICD-10-CM | POA: Diagnosis present

## 2020-11-22 DIAGNOSIS — Z20822 Contact with and (suspected) exposure to covid-19: Secondary | ICD-10-CM | POA: Diagnosis not present

## 2020-11-22 DIAGNOSIS — Z6841 Body Mass Index (BMI) 40.0 and over, adult: Secondary | ICD-10-CM | POA: Diagnosis not present

## 2020-11-22 DIAGNOSIS — R42 Dizziness and giddiness: Secondary | ICD-10-CM | POA: Diagnosis not present

## 2020-11-22 DIAGNOSIS — I480 Paroxysmal atrial fibrillation: Secondary | ICD-10-CM

## 2020-11-22 DIAGNOSIS — I428 Other cardiomyopathies: Secondary | ICD-10-CM

## 2020-11-22 DIAGNOSIS — N179 Acute kidney failure, unspecified: Secondary | ICD-10-CM | POA: Diagnosis not present

## 2020-11-22 DIAGNOSIS — I130 Hypertensive heart and chronic kidney disease with heart failure and stage 1 through stage 4 chronic kidney disease, or unspecified chronic kidney disease: Secondary | ICD-10-CM | POA: Diagnosis not present

## 2020-11-22 DIAGNOSIS — E1165 Type 2 diabetes mellitus with hyperglycemia: Secondary | ICD-10-CM | POA: Diagnosis not present

## 2020-11-22 DIAGNOSIS — Z7901 Long term (current) use of anticoagulants: Secondary | ICD-10-CM | POA: Diagnosis not present

## 2020-11-22 DIAGNOSIS — Z833 Family history of diabetes mellitus: Secondary | ICD-10-CM | POA: Diagnosis not present

## 2020-11-22 DIAGNOSIS — I5022 Chronic systolic (congestive) heart failure: Secondary | ICD-10-CM | POA: Diagnosis not present

## 2020-11-22 DIAGNOSIS — Z794 Long term (current) use of insulin: Secondary | ICD-10-CM | POA: Diagnosis not present

## 2020-11-22 DIAGNOSIS — F17210 Nicotine dependence, cigarettes, uncomplicated: Secondary | ICD-10-CM | POA: Diagnosis present

## 2020-11-22 DIAGNOSIS — E785 Hyperlipidemia, unspecified: Secondary | ICD-10-CM | POA: Diagnosis not present

## 2020-11-22 DIAGNOSIS — I959 Hypotension, unspecified: Secondary | ICD-10-CM

## 2020-11-22 DIAGNOSIS — E7800 Pure hypercholesterolemia, unspecified: Secondary | ICD-10-CM | POA: Diagnosis present

## 2020-11-22 DIAGNOSIS — E860 Dehydration: Secondary | ICD-10-CM | POA: Diagnosis not present

## 2020-11-22 DIAGNOSIS — F32A Depression, unspecified: Secondary | ICD-10-CM | POA: Diagnosis present

## 2020-11-22 DIAGNOSIS — E1140 Type 2 diabetes mellitus with diabetic neuropathy, unspecified: Secondary | ICD-10-CM | POA: Diagnosis not present

## 2020-11-22 LAB — BASIC METABOLIC PANEL
Anion gap: 11 (ref 5–15)
BUN: 49 mg/dL — ABNORMAL HIGH (ref 6–20)
CO2: 20 mmol/L — ABNORMAL LOW (ref 22–32)
Calcium: 9.3 mg/dL (ref 8.9–10.3)
Chloride: 106 mmol/L (ref 98–111)
Creatinine, Ser: 2.15 mg/dL — ABNORMAL HIGH (ref 0.61–1.24)
GFR, Estimated: 38 mL/min — ABNORMAL LOW (ref >60)
Glucose, Bld: 240 mg/dL — ABNORMAL HIGH (ref 70–99)
Potassium: 4.5 mmol/L (ref 3.5–5.1)
Sodium: 137 mmol/L (ref 135–145)

## 2020-11-22 LAB — CBG MONITORING, ED
Glucose-Capillary: 247 mg/dL — ABNORMAL HIGH (ref 70–99)
Glucose-Capillary: 123 mg/dL — ABNORMAL HIGH (ref 70–99)

## 2020-11-22 LAB — CBC
HCT: 40.3 % (ref 39.0–52.0)
Hemoglobin: 13.2 g/dL (ref 13.0–17.0)
MCH: 25.9 pg — ABNORMAL LOW (ref 26.0–34.0)
MCHC: 32.8 g/dL (ref 30.0–36.0)
MCV: 79.2 fL — ABNORMAL LOW (ref 80.0–100.0)
Platelets: 284 10*3/uL (ref 150–400)
RBC: 5.09 MIL/uL (ref 4.22–5.81)
RDW: 16.8 % — ABNORMAL HIGH (ref 11.5–15.5)
WBC: 6.6 10*3/uL (ref 4.0–10.5)
nRBC: 0 % (ref 0.0–0.2)

## 2020-11-22 LAB — LACTIC ACID, PLASMA
Lactic Acid, Venous: 3.7 mmol/L (ref 0.5–1.9)
Lactic Acid, Venous: 2.1 mmol/L (ref 0.5–1.9)
Lactic Acid, Venous: 1.6 mmol/L (ref 0.5–1.9)
Lactic Acid, Venous: 1.6 mmol/L (ref 0.5–1.9)

## 2020-11-22 LAB — URINALYSIS, ROUTINE W REFLEX MICROSCOPIC
Bilirubin Urine: NEGATIVE
Glucose, UA: NEGATIVE mg/dL
Hgb urine dipstick: NEGATIVE
Ketones, ur: NEGATIVE mg/dL
Leukocytes,Ua: NEGATIVE
Nitrite: NEGATIVE
Protein, ur: 30 mg/dL — AB
Specific Gravity, Urine: 1.01 (ref 1.005–1.030)
pH: 5 (ref 5.0–8.0)

## 2020-11-22 LAB — GLUCOSE, CAPILLARY
Glucose-Capillary: 155 mg/dL — ABNORMAL HIGH (ref 70–99)
Glucose-Capillary: 162 mg/dL — ABNORMAL HIGH (ref 70–99)

## 2020-11-22 LAB — HIV ANTIBODY (ROUTINE TESTING W REFLEX): HIV Screen 4th Generation wRfx: NONREACTIVE

## 2020-11-22 LAB — TROPONIN I (HIGH SENSITIVITY): Troponin I (High Sensitivity): 43 ng/L — ABNORMAL HIGH (ref <18)

## 2020-11-22 MED ORDER — ONDANSETRON HCL 4 MG/2ML IJ SOLN: 4.0000 mg | Freq: Four times a day (QID) | INTRAMUSCULAR | Status: DC | PRN

## 2020-11-22 MED ORDER — AMIODARONE HCL 200 MG PO TABS
100.0000 mg | ORAL_TABLET | Freq: Every day | ORAL | Status: DC
  Administered 2020-11-22 – 2020-11-26 (×5): 100 mg via ORAL
  Filled 2020-11-22 (×5): qty 1

## 2020-11-22 MED ORDER — ALLOPURINOL 100 MG PO TABS
100.0000 mg | ORAL_TABLET | Freq: Every day | ORAL | Status: DC
  Administered 2020-11-22: 100 mg via ORAL
  Filled 2020-11-22: qty 1

## 2020-11-22 MED ORDER — APIXABAN 5 MG PO TABS
5.0000 mg | ORAL_TABLET | Freq: Two times a day (BID) | ORAL | Status: DC
  Administered 2020-11-22 – 2020-11-26 (×9): 5 mg via ORAL
  Filled 2020-11-22 (×9): qty 1

## 2020-11-22 MED ORDER — BUSPIRONE HCL 5 MG PO TABS
10.0000 mg | ORAL_TABLET | Freq: Two times a day (BID) | ORAL | Status: DC
  Administered 2020-11-22 – 2020-11-26 (×9): 10 mg via ORAL
  Filled 2020-11-22 (×3): qty 2
  Filled 2020-11-22: qty 1
  Filled 2020-11-22 (×5): qty 2

## 2020-11-22 MED ORDER — ACETAMINOPHEN 650 MG RE SUPP: 650.0000 mg | Freq: Four times a day (QID) | RECTAL | Status: DC | PRN

## 2020-11-22 MED ORDER — MECLIZINE HCL 25 MG PO TABS: 25.0000 mg | ORAL_TABLET | Freq: Three times a day (TID) | ORAL | Status: DC

## 2020-11-22 MED ORDER — INSULIN ASPART 100 UNIT/ML ~~LOC~~ SOLN
0.0000 [IU] | Freq: Three times a day (TID) | SUBCUTANEOUS | Status: DC
  Administered 2020-11-22: 3 [IU] via SUBCUTANEOUS
  Administered 2020-11-22 – 2020-11-23 (×3): 5 [IU] via SUBCUTANEOUS
  Administered 2020-11-23: 2 [IU] via SUBCUTANEOUS
  Administered 2020-11-24 – 2020-11-25 (×4): 3 [IU] via SUBCUTANEOUS
  Administered 2020-11-25: 8 [IU] via SUBCUTANEOUS
  Administered 2020-11-25: 5 [IU] via SUBCUTANEOUS
  Administered 2020-11-26: 8 [IU] via SUBCUTANEOUS
  Administered 2020-11-26: 5 [IU] via SUBCUTANEOUS

## 2020-11-22 MED ORDER — CARVEDILOL 6.25 MG PO TABS
6.2500 mg | ORAL_TABLET | Freq: Two times a day (BID) | ORAL | Status: DC
  Administered 2020-11-22 – 2020-11-26 (×9): 6.25 mg via ORAL
  Filled 2020-11-22 (×2): qty 1
  Filled 2020-11-22: qty 2
  Filled 2020-11-22 (×6): qty 1

## 2020-11-22 MED ORDER — INSULIN NPH (HUMAN) (ISOPHANE) 100 UNIT/ML ~~LOC~~ SUSP
20.0000 [IU] | Freq: Two times a day (BID) | SUBCUTANEOUS | Status: DC
  Administered 2020-11-22 – 2020-11-26 (×9): 20 [IU] via SUBCUTANEOUS
  Filled 2020-11-22 (×2): qty 10

## 2020-11-22 MED ORDER — LORATADINE 10 MG PO TABS
10.0000 mg | ORAL_TABLET | Freq: Every day | ORAL | Status: DC
  Administered 2020-11-22 – 2020-11-26 (×5): 10 mg via ORAL
  Filled 2020-11-22 (×5): qty 1

## 2020-11-22 MED ORDER — ONDANSETRON HCL 4 MG PO TABS: 4.0000 mg | ORAL_TABLET | Freq: Four times a day (QID) | ORAL | Status: DC | PRN

## 2020-11-22 MED ORDER — GABAPENTIN 300 MG PO CAPS
300.0000 mg | ORAL_CAPSULE | Freq: Three times a day (TID) | ORAL | Status: DC
  Administered 2020-11-22 – 2020-11-26 (×13): 300 mg via ORAL
  Filled 2020-11-22 (×13): qty 1

## 2020-11-22 MED ORDER — ACETAMINOPHEN 325 MG PO TABS
650.0000 mg | ORAL_TABLET | Freq: Four times a day (QID) | ORAL | Status: DC | PRN
  Administered 2020-11-24 – 2020-11-25 (×2): 650 mg via ORAL
  Filled 2020-11-22 (×3): qty 2

## 2020-11-22 MED ORDER — TRAZODONE HCL 100 MG PO TABS
100.0000 mg | ORAL_TABLET | Freq: Every day | ORAL | Status: DC
  Administered 2020-11-22 – 2020-11-25 (×4): 100 mg via ORAL
  Filled 2020-11-22 (×4): qty 1

## 2020-11-22 NOTE — Progress Notes (Signed)
PROGRESS NOTE    RECO SHONK  ZOX:[id number] DOB: [birth year] DOA: 11/21/2020 PCP: [name], FNP   Brief Narrative:  Patient is 45 year old male with past medical history of an ICM with ejection fraction of 20 to 25% status post AICD placement, hypertension, type 2 diabetes mellitus, OSA on CPAP, paroxysmal A. fib on Eliquis, CKD stage III with prior RCC resection, morbid obesity tobacco abuse, marijuana abuse presents to emergency department with lightheadedness and dizziness and hypotension.  Upon arrival to ED: Patient's blood pressure noted to be 81/36 which improved to 96/60 1:05 0.5 L of IV fluid bolus.  Lactic acid: 3.3 and 3.7.  Creatinine: 2.4 up from 1.4 in December.  Patient admitted for further evaluation and management.  Assessment & Plan:  Lightheadedness/dizziness: -Likely in the setting of hypotension due to decreased p.o. intake versus polypharmacy and dehydration due to diarrhea and diuretics. -Patient was given IVF 1.5 L bolus in ED -Blood pressure improved.  Lactic acid trended down from 3.7-2.1 -Continue to hold diuretics and Entresto -We will check orthostatic vitals -Consult PT/OT  AKI on CKD stage IIIb: -Baseline creatinine: 1.4.  Creatinine on admission: 2.15. -Receive IV fluid bolus in ED. -Continue to hold diuretics and nephrotoxic medication and monitor renal function closely.  Paroxysmal A. Fib: -Rate controlled.  Continue Coreg and Eliquis and amiodarone -Monitor heart rate closely on telemetry  Chronic combined systolic and diastolic CHF/NICM: -Status post AICD placement -Patient appears dry on exam.  Reviewed echo from 9/2 shows ejection fraction of 20 to 25% with grade 1 diastolic dysfunction.  Chest x-ray negative for fluid overload.  BNP: WNL -Continue Coreg, hold Entresto, Aldactone, torsemide at this time due to low blood pressure. -Strict INO's and daily weight.  Type 2 diabetes mellitus: -Uncontrolled.  Last A1c: 8.6% on  1/24 -Hold home meds.  Continue sliding scale insulin and Novolin 20 units twice daily and monitor blood sugar closely.    Diabetic neuropathy: Continue gabapentin  Depression: Continue Wellbutrin and trazodone  Tobacco abuse/marijuana abuse: Counseled about cessation  OSA: We will order CPAP at night  Morbid obesity with BMI of 53: -Diet modification/exercise and weight loss recommended  DVT prophylaxis: Eliquis Code Status: Full code Family Communication:  None present at bedside.  Plan of care discussed with patient in length and he verbalized understanding and agreed with it. Disposition Plan: Home in 1 to 2 days  Consultants:   None  Procedures:   None  Antimicrobials:   None  Status is: Inpatient  Dispo: The patient is from: Home              Anticipated d/c is to: Home              Anticipated d/c date is: 2 days              Patient currently is not medically stable to d/c.   Difficult to place patient No         Subjective: Patient seen and examined.  Tells me that he feels little better this morning as compared to yesterday however he still feel dizzy and lightheaded and feels that he has dry mouth.  Had diarrhea couple of days ago and no episodes since admission.  Denies nausea, vomiting, abdominal pain.  Denies chest pain or shortness of breath or leg swelling.  Smokes 1 cigarette/day and uses marijuana twice a day.  Denies alcohol use.  Compliant with his home medications.  Objective: Vitals:   11/22/20 0430 11/22/20  0445 11/22/20 0500 11/22/20 0515  BP: 127/80 (!) 133/92 133/89 119/74  Pulse: 66 70 73 69  Resp: (!) 33 (!) 21 (!) 25 (!) 27  Temp:      TempSrc:      SpO2: 97% 95% 97% 94%  Weight:      Height:       No intake or output data in the 24 hours ending 11/22/20 0826 Filed Weights   11/21/20 2149  Weight: (!) 154.2 kg    Examination:  General exam: Appears calm and comfortable, on CPAP, communicating well, appears very  dehydrated, obese Respiratory system: Clear to auscultation. Respiratory effort normal. Cardiovascular system: S1 & S2 heard, RRR. No JVD, murmurs, rubs, gallops or clicks. No pedal edema. Gastrointestinal system: Abdomen is nondistended, soft and nontender. No organomegaly or masses felt. Normal bowel sounds heard. Central nervous system: Alert and oriented. No focal neurological deficits. Extremities: Symmetric 5 x 5 power. Skin: No rashes, lesions or ulcers Psychiatry: Judgement and insight appear normal. Mood & affect appropriate.    Data Reviewed: I have personally reviewed following labs and imaging studies  CBC: Recent Labs  Lab 11/21/20 2155  WBC 7.7  HGB 13.9  HCT 43.8  MCV 79.2*  PLT 097   Basic Metabolic Panel: Recent Labs  Lab 11/21/20 2155  NA 136  K 4.3  CL 105  CO2 18*  GLUCOSE 213*  BUN 47*  CREATININE 2.42*  CALCIUM 9.7   GFR: Estimated Creatinine Clearance: 54.6 mL/min (A) (by C-G formula based on SCr of 2.42 mg/dL (H)). Liver Function Tests: No results for input(s): AST, ALT, ALKPHOS, BILITOT, PROT, ALBUMIN in the last 168 hours. No results for input(s): LIPASE, AMYLASE in the last 168 hours. No results for input(s): AMMONIA in the last 168 hours. Coagulation Profile: No results for input(s): INR, PROTIME in the last 168 hours. Cardiac Enzymes: No results for input(s): CKTOTAL, CKMB, CKMBINDEX, TROPONINI in the last 168 hours. BNP (last 3 results) No results for input(s): PROBNP in the last 8760 hours. HbA1C: No results for input(s): HGBA1C in the last 72 hours. CBG: Recent Labs  Lab 11/21/20 2150  GLUCAP 215*   Lipid Profile: No results for input(s): CHOL, HDL, LDLCALC, TRIG, CHOLHDL, LDLDIRECT in the last 72 hours. Thyroid Function Tests: No results for input(s): TSH, T4TOTAL, FREET4, T3FREE, THYROIDAB in the last 72 hours. Anemia Panel: No results for input(s): VITAMINB12, FOLATE, FERRITIN, TIBC, IRON, RETICCTPCT in the last 72  hours. Sepsis Labs: Recent Labs  Lab 11/21/20 2221 11/22/20 0021  LATICACIDVEN 3.3* 3.7*    Recent Results (from the past 240 hour(s))  Resp Panel by RT-PCR (Flu A&B, Covid) Nasopharyngeal Swab     Status: None   Collection Time: 11/21/20 10:40 PM   Specimen: Nasopharyngeal Swab; Nasopharyngeal(NP) swabs in vial transport medium  Result Value Ref Range Status   SARS Coronavirus 2 by RT PCR NEGATIVE NEGATIVE Final    Comment: (NOTE) SARS-CoV-2 target nucleic acids are NOT DETECTED.  The SARS-CoV-2 RNA is generally detectable in upper respiratory specimens during the acute phase of infection. The lowest concentration of SARS-CoV-2 viral copies this assay can detect is 138 copies/mL. A negative result does not preclude SARS-Cov-2 infection and should not be used as the sole basis for treatment or other patient management decisions. A negative result may occur with  improper specimen collection/handling, submission of specimen other than nasopharyngeal swab, presence of viral mutation(s) within the areas targeted by this assay, and inadequate number of viral copies(<138  copies/mL). A negative result must be combined with clinical observations, patient history, and epidemiological information. The expected result is Negative.  Fact Sheet for Patients:  EntrepreneurPulse.com.au  Fact Sheet for Healthcare Providers:  IncredibleEmployment.be  This test is no t yet approved or cleared by the [place] FDA and  has been authorized for detection and/or diagnosis of SARS-CoV-2 by FDA under an Emergency Use Authorization (EUA). This EUA will remain  in effect (meaning this test can be used) for the duration of the COVID-19 declaration under Section 564(b)(1) of the Act, 21 U.S.C.section 360bbb-3(b)(1), unless the authorization is terminated  or revoked sooner.       Influenza A by PCR NEGATIVE NEGATIVE Final   Influenza B by PCR NEGATIVE  NEGATIVE Final    Comment: (NOTE) The Xpert Xpress SARS-CoV-2/FLU/RSV plus assay is intended as an aid in the diagnosis of influenza from Nasopharyngeal swab specimens and should not be used as a sole basis for treatment. Nasal washings and aspirates are unacceptable for Xpert Xpress SARS-CoV-2/FLU/RSV testing.  Fact Sheet for Patients: EntrepreneurPulse.com.au  Fact Sheet for Healthcare Providers: IncredibleEmployment.be  This test is not yet approved or cleared by the [place] FDA and has been authorized for detection and/or diagnosis of SARS-CoV-2 by FDA under an Emergency Use Authorization (EUA). This EUA will remain in effect (meaning this test can be used) for the duration of the COVID-19 declaration under Section 564(b)(1) of the Act, 21 U.S.C. section 360bbb-3(b)(1), unless the authorization is terminated or revoked.  Performed at [facility], [place] [address], [place], [place] [address]       Radiology Studies: DG Chest Portable 1 View  Result Date: 11/21/2020 CLINICAL DATA:  Hypotension, dizziness EXAM: PORTABLE CHEST 1 VIEW COMPARISON:  Chest x-ray 05/17/2020, CT chest 12/28/2014 FINDINGS: Left chest for cardiac defibrillator in similar position. Cardiomegaly. The heart size and mediastinal contours are unchanged. Streaky airspace opacity at the right base. No focal consolidation. No pulmonary edema. No pleural effusion. No pneumothorax. No acute osseous abnormality. IMPRESSION: 1. Streaky airspace opacity at the right base that likely represents atelectasis. 2. Stable cardiomegaly. Electronically Signed   By: Iven Finn M.D.   On: 11/21/2020 22:43   CUP PACEART REMOTE DEVICE CHECK  Result Date: 11/20/2020 Scheduled remote reviewed. Normal device function.  Next remote 91 days. Kathy Breach, RN, CCDS, CV Remote SolutionsSensing Configuration: Secondary Gain Setting: 1X Post Shock Pacing: OFF   Scheduled Meds: .  allopurinol  100 mg Oral Daily  . amiodarone  100 mg Oral Daily  . apixaban  5 mg Oral BID  . busPIRone  10 mg Oral BID  . carvedilol  6.25 mg Oral BID WC  . gabapentin  300 mg Oral TID  . insulin aspart  0-15 Units Subcutaneous TID WC  . insulin NPH Human  20 Units Subcutaneous BID AC & HS  . loratadine  10 mg Oral Daily  . traZODone  100 mg Oral QHS   Continuous Infusions:   LOS: 0 days   Time spent: 35 minutes  Alynn Ellithorpe Loann Quill, MD [facility]  If 7PM-7AM, please contact night-coverage [email or web address] 11/22/2020, 8:26 AM

## 2020-11-22 NOTE — H&P (Signed)
History and Physical    Jeffrey [name]:[id number] DOB: 1975/11/12 DOA: 11/21/2020  PCP: [name], FNP  Patient coming from: Home  I have personally briefly reviewed patient's old medical records in [place]  Chief Complaint: Hypotension, dizziness  HPI: Jeffrey [name] is a 45 y.o. male with medical history significant of NICM (EF 20-25%) s/p AICD, HTN, DM2, OSA, PAF on eliquis, CKD 3 with prior RCC resection.  Pt presents to the ED for lightheadedness and hypotension.  Symptoms ongoing for past couple of days at home, worse with standing, nothing makes better, not helped by meclizine.  No recent illnesses nor medication changes.  No fevers, chills, cough, SOB, N/V/D, CP, hematochezia or melena, abd pain.   ED Course: BP initially as low as 81/36, improved to 96/65 after 1.5L IVF.  Lactates 3.3 and 3.7.  Creat 2.4 up from 1.4 in Dec.   Review of Systems: As per HPI, otherwise all review of systems negative.  Past Medical History:  Diagnosis Date  . Anxiety   . Aspirin allergy   . Childhood asthma   . Chronic systolic CHF (congestive heart failure) ([place])    a. EF 20-25% in 2012. b. EF 45-50% in 10/2011 with nonischemic nuc - presumed NICM. c. 12/2014 Echo: Sev depressed LV fxn, sev dil LV, mild LVH, mild MR, sev dil LA, mildly reduced RV fxn.  . CKD (chronic kidney disease) stage 2, GFR 60-89 ml/min   . H/O vasectomy 12/2019  . High cholesterol   . Hypertension   . Morbid obesity ([place])   . Nephrolithiasis   . OSA on CPAP   . Presumed NICM    a. 04/2014 Myoview: EF 26%, glob HK, sev glob HK, ? prior infarct;  b. Never cathed 2/2 CKD.  [name] Renal cell carcinoma ([place])    a. s/p Rt robotic assisted partial converted to radical nephrectomy on 01/2013.  . Troponin level elevated    a. 04/2014, 12/2014: felt due to CHF.  . Type II diabetes mellitus ([place])   . Ventricular tachycardia ([place])    a. appropriate ICD therapy 12/2017    Past Surgical History:  Procedure  Laterality Date  . APPENDECTOMY  07/2004  . CARDIAC CATHETERIZATION N/A 05/17/2015   Procedure: Right/Left Heart Cath and Coronary Angiography;  Surgeon: Jolaine Artist, MD;  Location: [facility];  Service: Cardiovascular;  Laterality: N/A;  . EP IMPLANTABLE DEVICE N/A 05/17/2015   Procedure: SubQ ICD Implant;  Surgeon: Will Meredith Leeds, MD;  Location: [facility];  Service: Cardiovascular;  Laterality: N/A;  . ROBOTIC ASSISTED LAPAROSCOPIC LYSIS OF ADHESION  01/13/2013   Procedure: ROBOTIC ASSISTED LAPAROSCOPIC LYSIS OF ADHESION EXTENSIVE;  Surgeon: Alexis Frock, MD;  Location: [facility];  Service: Urology;;  . ROBOTIC ASSITED PARTIAL NEPHRECTOMY Right 01/13/2013   Procedure: ROBOTIC ASSITED PARTIAL NEPHRECTOMY CONVERTED TO ROBOTIC ASSISTED RIGHT RADICAL NEPHRECTOMY;  Surgeon: Alexis Frock, MD;  Location: [facility];  Service: Urology;  Laterality: Right;  . SUBQ ICD CHANGEOUT N/A 05/16/2020   Procedure: SUBQ ICD CHANGEOUT;  Surgeon: [name], MD;  Location: [facility];  Service: Cardiovascular;  Laterality: N/A;  . SUBQ ICD CHANGEOUT N/A 05/15/2020   Procedure: TKPT ICD CHANGEOUT;  Surgeon: [name], MD;  Location: [facility];  Service: Cardiovascular;  Laterality: N/A;  . VASECTOMY       reports that he quit smoking about 5 years ago. He has a 5.50 pack-year smoking history.  He has never used smokeless tobacco. He reports current drug use. Frequency: 14.00 times per week. Drug: Marijuana. He reports that he does not drink alcohol.  Allergies  Allergen Reactions  . Aspirin Shortness Of Breath, Itching and Rash     Burning sensation (Patient reports he tolerates other NSAIDS)   . Bee Venom Hives and Swelling  . Lisinopril Cough  . Tomato Rash    Family History  Problem Relation Age of Onset  . Hypertension Mother   . Diabetes Maternal Aunt   . Heart attack Neg Hx   . Stroke Neg Hx      Prior to Admission medications   Medication Sig  Start Date End Date Taking? Authorizing Provider  allopurinol (ZYLOPRIM) 100 MG tablet Take 1 tablet (100 mg total) by mouth daily. 06/27/20  Yes [name], FNP  amiodarone (PACERONE) 100 MG tablet Take 1 tablet (100 mg total) by mouth daily. 06/26/20  Yes Bensimhon, Shaune Pascal, MD  apixaban (ELIQUIS) 5 MG TABS tablet Take 2 tablets (10 mg total) by mouth 2 (two) times daily for 7 days, THEN 1 tablet (5 mg total) 2 (two) times daily. Patient taking differently: 1 tablet (5 mg total) 2 (two) times daily. 06/06/20 06/13/21 Yes Bensimhon, Shaune Pascal, MD  busPIRone (BUSPAR) 10 MG tablet Take 1 tablet (10 mg total) by mouth 2 (two) times daily. 06/24/20  Yes [name], FNP  carvedilol (COREG) 6.25 MG tablet Take 1 tablet (6.25 mg total) by mouth 2 (two) times daily. 08/07/20  Yes Bensimhon, Shaune Pascal, MD  cetirizine (ZYRTEC) 10 MG tablet Take 1 tablet (10 mg total) by mouth daily. 06/27/20  Yes [name], FNP  Colchicine (MITIGARE) 0.6 MG CAPS Take 1 tablet by mouth 2 (two) times daily as needed (gout). 06/27/20  Yes [name], FNP  empagliflozin (JARDIANCE) 10 MG TABS tablet Take 1 tablet (10 mg total) by mouth daily before breakfast. 09/16/20  Yes Bensimhon, Shaune Pascal, MD  fluticasone (FLONASE) 50 MCG/ACT nasal spray Place 2 sprays into both nostrils daily. 09/18/19  Yes [name], FNP  gabapentin (NEURONTIN) 300 MG capsule Take 1 capsule (300 mg total) by mouth 3 (three) times daily. 06/27/20  Yes [name], FNP  glipiZIDE (GLUCOTROL) 10 MG tablet Take 1 tablet (10 mg total) by mouth 2 (two) times daily before a meal. 06/27/20  Yes [name], FNP  glucose blood (ACCU-CHEK GUIDE) test strip USE AS DIRECTED FOUR TIMES DAILY 12/18/19  Yes [name], FNP  insulin NPH-regular Human (70-30) 100 UNIT/ML injection Inject 38 Units into the skin 2 (two) times daily with a meal. 06/27/20  Yes [name], FNP  isosorbide-hydrALAZINE (BIDIL) 20-37.5 MG tablet Take 2  tablets by mouth 3 (three) times daily. 08/07/20  Yes Bensimhon, Shaune Pascal, MD  Magnesium Oxide 200 MG TABS Take 100 mg by mouth daily.   Yes [provider]  meclizine (ANTIVERT) 25 MG tablet Take 1 tablet (25 mg total) by mouth 3 (three) times daily. 06/27/20  Yes [name], FNP  metFORMIN (GLUCOPHAGE) 1000 MG tablet Take 1 tablet (1,000 mg total) by mouth 2 (two) times daily with a meal. 06/24/20  Yes [name], FNP  sacubitril-valsartan (ENTRESTO) 97-103 MG Take 1 tablet by mouth 2 (two) times daily. 06/27/20  Yes [name], FNP  spironolactone (ALDACTONE) 25 MG tablet Take 0.5 tablets (12.5 mg total) by mouth daily. 06/27/20 03/19/22 Yes [name], FNP  torsemide (DEMADEX) 20 MG tablet Take 2 tablets (40 mg total) by mouth daily. 06/27/20  Yes [name], FNP  Accu-Chek FastClix Lancets MISC USE AS DIRECTED FOUR TIMES DAILY 12/18/19   [name], FNP  acetaminophen (TYLENOL) 325 MG tablet Take 2 tablets (650 mg total) by mouth every 4 (four) hours as needed for mild pain, moderate pain, fever or headache. 05/17/20   Shirley Friar, PA-C  blood glucose meter kit and supplies Dispense based on patient and insurance preference. Use up to four times daily as directed. (FOR ICD-10 E10.9, E11.9). 01/23/19   [name], FNP  Continuous Blood Gluc Receiver (FREESTYLE LIBRE 14 DAY READER) DEVI 1 each by Does not apply route 3 (three) times daily between meals as needed. 03/19/20   [name], FNP  Continuous Blood Gluc Sensor (GUARDIAN SENSOR 3) MISC 1 Device by Does not apply route 3 (three) times daily between meals as needed. 03/25/20   [name], FNP  Insulin Pen Needle (TRUEPLUS 5-BEVEL PEN NEEDLES) 31G X 6 MM MISC 1 Syringe by Does not apply route as needed. 05/10/19   [name], FNP  NOVOLOG FLEXPEN 100 UNIT/ML FlexPen INJECT 0-15 UNIT(S) INTO THE SKIN THREE TIMES DAILY WITH MEALS VIA SLIDING SCALE 11/15/20   [name], FNP  traZODone (DESYREL) 100 MG tablet Take 1 tablet (100 mg total) by mouth at bedtime. For sleep 06/27/20   [name], FNP  TRUEPLUS INSULIN SYRINGE 31G X 5/16" 0.5 ML MISC USE TO INJECT INSULIN TWICE DAILY. 09/17/20   [name], FNP  Vitamin D, Ergocalciferol, (DRISDOL) 1.25 MG (50000 UNIT) CAPS capsule Take 1 capsule (50,000 Units total) by mouth once a week. Patient taking differently: Take 50,000 Units by mouth every Monday. 10/29/20   [name], FNP    Physical Exam: Vitals:   11/22/20 0045 11/22/20 0100 11/22/20 0115 11/22/20 0130  BP: (!) 84/53 (!) 101/49 (!) 93/54 (!) 96/55  Pulse: 70 67 69 70  Resp: (!) 31 16 (!) 28 18  Temp:      TempSrc:      SpO2: 96% 96% 96% 98%  Weight:      Height:        Constitutional: NAD, calm, comfortable Eyes: PERRL, lids and conjunctivae normal ENMT: Mucous membranes are moist. Posterior pharynx clear of any exudate or lesions.Normal dentition.  Neck: normal, supple, no masses, no thyromegaly Respiratory: clear to auscultation bilaterally, no wheezing, no crackles. Normal respiratory effort. No accessory muscle use.  Cardiovascular: Regular rate and rhythm, no murmurs / rubs / gallops. No extremity edema. 2+ pedal pulses. No carotid bruits.  Abdomen: no tenderness, no masses palpated. No hepatosplenomegaly. Bowel sounds positive.  Musculoskeletal: no clubbing / cyanosis. No joint deformity upper and lower extremities. Good ROM, no contractures. Normal muscle tone.  Skin: no rashes, lesions, ulcers. No induration Neurologic: CN 2-12 grossly intact. Sensation intact, DTR normal. Strength 5/5 in all 4.  Psychiatric: Normal judgment and insight. Alert and oriented x 3. Normal mood.    Labs on Admission: I have personally reviewed following labs and imaging studies  CBC: Recent Labs  Lab 11/21/20 2155  WBC 7.7  HGB 13.9  HCT 43.8  MCV 79.2*  PLT 563   Basic Metabolic Panel: Recent Labs  Lab  11/21/20 2155  NA 136  K 4.3  CL 105  CO2 18*  GLUCOSE 213*  BUN 47*  CREATININE 2.42*  CALCIUM 9.7  GFR: Estimated Creatinine Clearance: 54.6 mL/min (A) (by C-G formula based on SCr of 2.42 mg/dL (H)). Liver Function Tests: No results for input(s): AST, ALT, ALKPHOS, BILITOT, PROT, ALBUMIN in the last 168 hours. No results for input(s): LIPASE, AMYLASE in the last 168 hours. No results for input(s): AMMONIA in the last 168 hours. Coagulation Profile: No results for input(s): INR, PROTIME in the last 168 hours. Cardiac Enzymes: No results for input(s): CKTOTAL, CKMB, CKMBINDEX, TROPONINI in the last 168 hours. BNP (last 3 results) No results for input(s): PROBNP in the last 8760 hours. HbA1C: No results for input(s): HGBA1C in the last 72 hours. CBG: Recent Labs  Lab 11/21/20 2150  GLUCAP 215*   Lipid Profile: No results for input(s): CHOL, HDL, LDLCALC, TRIG, CHOLHDL, LDLDIRECT in the last 72 hours. Thyroid Function Tests: No results for input(s): TSH, T4TOTAL, FREET4, T3FREE, THYROIDAB in the last 72 hours. Anemia Panel: No results for input(s): VITAMINB12, FOLATE, FERRITIN, TIBC, IRON, RETICCTPCT in the last 72 hours. Urine analysis:    Component Value Date/Time   COLORURINE YELLOW 12/29/2017 1933   APPEARANCEUR Clear 10/28/2020 1036   LABSPEC 1.020 12/29/2017 1933   PHURINE 5.0 12/29/2017 1933   GLUCOSEU Negative 10/28/2020 1036   HGBUR SMALL (A) 12/29/2017 1933   BILIRUBINUR Negative 10/28/2020 1036   KETONESUR negative 03/17/2019 [place] 12/29/2017 1933   PROTEINUR 2+ (A) 10/28/2020 1036   PROTEINUR >=300 (A) 12/29/2017 1933   UROBILINOGEN 0.2 06/24/2020 0929   UROBILINOGEN 0.2 11/01/2017 1128   NITRITE Negative 10/28/2020 1036   NITRITE NEGATIVE 12/29/2017 1933   LEUKOCYTESUR Negative 10/28/2020 1036    Radiological Exams on Admission: DG Chest Portable 1 View  Result Date: 11/21/2020 CLINICAL DATA:  Hypotension, dizziness EXAM:  PORTABLE CHEST 1 VIEW COMPARISON:  Chest x-ray 05/17/2020, CT chest 12/28/2014 FINDINGS: Left chest for cardiac defibrillator in similar position. Cardiomegaly. The heart size and mediastinal contours are unchanged. Streaky airspace opacity at the right base. No focal consolidation. No pulmonary edema. No pleural effusion. No pneumothorax. No acute osseous abnormality. IMPRESSION: 1. Streaky airspace opacity at the right base that likely represents atelectasis. 2. Stable cardiomegaly. Electronically Signed   By: Iven Finn M.D.   On: 11/21/2020 22:43   CUP PACEART REMOTE DEVICE CHECK  Result Date: 11/20/2020 Scheduled remote reviewed. Normal device function.  Next remote 91 days. Kathy Breach, RN, CCDS, CV Remote SolutionsSensing Configuration: Secondary Gain Setting: 1X Post Shock Pacing: OFF   EKG: Independently reviewed.  Assessment/Plan Principal Problem:   Hypotension Active Problems:   Type 2 diabetes mellitus with stage 3 chronic kidney disease, with long-term current use of insulin (H[facility])   AKI (acute kidney injury) ([place])   Chronic systolic CHF (congestive heart failure) ([facility])   CKD (chronic kidney disease), stage III ([facility])   PAF (paroxysmal atrial fibrillation) ([facility])   Class 3 severe obesity due to excess calories with serious comorbidity and body mass index (BMI) of 45.0 to 49.9 in adult [facility])   NICM (nonischemic cardiomyopathy) (H[facility])   Lactic acidosis    1. Hypotension - 1. Causing dizziness, AKI on CKD 3, and lactic acidosis 2. Unclear cause of hypotension: perhaps over diuresis / BP meds / dehydration? 3. IVF: 1.5L bolus in ED 4. Hold home BP meds (except coreg) 2. AKI on CKD 3 - 1. IVF as above 2. Hold home nephrotoxic meds (entresto, diuretics, etc) 3. Strict intake and output 4. Repeat BMP in AM 3. Lactic acidosis - 1. IVF as  above 2. Repeat lactic acid level 4. Chronic systolic CHF - 1. Holding diuretics 2. Holding entresto 3. Holding 5. PAF - 1. Cont  coreg 2. Cont eliquis 3. Cont amiodarone 6. DM2 - 1. Hold home meds 2. NPH 20u bid 3. Mod scale SSI AC  DVT prophylaxis: Eliquis Code Status: Full Family Communication: No family in room Disposition Plan: Home after hypotension, AKI, and lactic acidosis resolved Consults called: None Admission status: Admit to inpatient  Severity of Illness: The appropriate patient status for this patient is INPATIENT. Inpatient status is judged to be reasonable and necessary in order to provide the required intensity of service to ensure the patient's safety. The patient's presenting symptoms, physical exam findings, and initial radiographic and laboratory data in the context of their chronic comorbidities is felt to place them at high risk for further clinical deterioration. Furthermore, it is not anticipated that the patient will be medically stable for discharge from the hospital within 2 midnights of admission. The following factors support the patient status of inpatient.   IP status due to hypotension with organ failure (AKI and lactic acidosis).  * I certify that at the point of admission it is my clinical judgment that the patient will require inpatient hospital care spanning beyond 2 midnights from the point of admission due to high intensity of service, high risk for further deterioration and high frequency of surveillance required.*    Nikiesha Milford M. DO [facility]  How to contact the [facility] Attending or Consulting provider [place] or covering provider during after hours [place], for this patient?  1. Check the care team in [facility] and look for a) attending/consulting [facility] provider listed and b) the [facility] team listed 2. Log into [email or web address]  Amion Physician Scheduling and messaging for groups and whole hospitals  On call and physician scheduling software for group practices, residents, hospitalists and other medical providers for call, clinic, rotation and shift schedules. OnCall  [employer] is a hospital-wide system for scheduling doctors and paging doctors on call. EasyPlot is for scientific plotting and data analysis.  [email or web address]  and use [place] universal password to access. If you do not have the password, please contact the hospital operator.  3. Locate the [facility] provider you are looking for under [facility] and page to a number that you can be directly reached. 4. If you still have difficulty reaching the provider, please page the [facility] (Director on Call) for the Hospitalists listed on amion for assistance.  11/22/2020, 2:02 AM

## 2020-11-22 NOTE — ED Provider Notes (Signed)
Care assumed from Dr. Christy Gentles, please see his note for full details, but in brief patient with history of nonischemic cardiomyopathy presents after feeling lightheaded for the past several days, found to be hypotensive in the 80s, worse with standing. No fevers or recent illness.  Patient appears very dry, not fluid overloaded, lactic of 3.3 but suspect this is all in the setting of dehydration, acute kidney injury with creatinine increased to 2.42, usually around 1.4. No focal infectious symptoms. Despite hypotension patient is alert, nontoxic-appearing and mentating well.  Patient given 1 L fluid bolus and now getting additional 500 cc  Plan: Patient will require admission for gentle rehydration, patient is also on multiple blood pressure medications that could be contributing to hypotension and AKI and may need adjustment in medications.   BP 114/71   Pulse 66   Temp (!) 97.5 F (36.4 C) (Oral)   Resp (!) 21   Ht 5\' 7"  (1.702 m)   Wt (!) 154.2 kg   SpO2 95%   BMI 53.25 kg/m    ED Course/Procedures   Labs Reviewed  BASIC METABOLIC PANEL - Abnormal; Notable for the following components:      Result Value   CO2 18 (*)    Glucose, Bld 213 (*)    BUN 47 (*)    Creatinine, Ser 2.42 (*)    GFR, Estimated 33 (*)    All other components within normal limits  CBC - Abnormal; Notable for the following components:   MCV 79.2 (*)    MCH 25.1 (*)    RDW 16.8 (*)    All other components within normal limits  LACTIC ACID, PLASMA - Abnormal; Notable for the following components:   Lactic Acid, Venous 3.3 (*)    All other components within normal limits  LACTIC ACID, PLASMA - Abnormal; Notable for the following components:   Lactic Acid, Venous 3.7 (*)    All other components within normal limits  CBG MONITORING, ED - Abnormal; Notable for the following components:   Glucose-Capillary 215 (*)    All other components within normal limits  TROPONIN I (HIGH SENSITIVITY) - Abnormal;  Notable for the following components:   Troponin I (High Sensitivity) 43 (*)    All other components within normal limits  RESP PANEL BY RT-PCR (FLU A&B, COVID) ARPGX2  BRAIN NATRIURETIC PEPTIDE  URINALYSIS, ROUTINE W REFLEX MICROSCOPIC  HIV ANTIBODY (ROUTINE TESTING W REFLEX)  CBC  BASIC METABOLIC PANEL  LACTIC ACID, PLASMA  LACTIC ACID, PLASMA    DG Chest Portable 1 View  Result Date: 11/21/2020 CLINICAL DATA:  Hypotension, dizziness EXAM: PORTABLE CHEST 1 VIEW COMPARISON:  Chest x-ray 05/17/2020, CT chest 12/28/2014 FINDINGS: Left chest for cardiac defibrillator in similar position. Cardiomegaly. The heart size and mediastinal contours are unchanged. Streaky airspace opacity at the right base. No focal consolidation. No pulmonary edema. No pleural effusion. No pneumothorax. No acute osseous abnormality. IMPRESSION: 1. Streaky airspace opacity at the right base that likely represents atelectasis. 2. Stable cardiomegaly. Electronically Signed   By: Iven Finn M.D.   On: 11/21/2020 22:43     Procedures  MDM   With continued IV fluid rehydration patient's blood pressure continues to improve, lactic acid is still elevated at 3.7, but still feel this is most likely in the setting of dehydration and AKI patient with severe heart failure at baseline. Feel patient is stable for hospitalist service.  Case discussed with Dr. Alcario Drought with [facility] who will see and admit  the patient.       Jacqlyn Larsen, PA-C 11/22/20 [id number]    Orpah Greek, MD 11/22/20 [phone]

## 2020-11-22 NOTE — ED Notes (Signed)
Breakfast Ordered 

## 2020-11-22 NOTE — Plan of Care (Signed)
  Problem: Education: Goal: Knowledge of General Education information will improve Description: Including pain rating scale, medication(s)/side effects and non-pharmacologic comfort measures Outcome: Completed/Met

## 2020-11-22 NOTE — ED Notes (Signed)
Provider aware of lactic

## 2020-11-22 NOTE — Progress Notes (Signed)
New Admission Note:   Arrival Method: bed  Mental Orientation: Alert and oriented  Telemetry: Box: Box 18  Assessment: Completed Skin: Intact  IV: Left hand SL Pain: 0/10  Tubes: none  Safety Measures: Safety Fall Prevention Plan has been given, discussed and signed Admission: Completed [facility] [place] Orientation: Patient has been orientated to the room, unit and staff.  Family: none   Orders have been reviewed and implemented. Will continue to monitor the patient. Call light has been placed within reach and bed alarm has been activated.   Flem Enderle RN [facility] Phone: [phone]

## 2020-11-22 NOTE — Plan of Care (Signed)
  Problem: Education: Goal: Knowledge of General Education information will improve Description Including pain rating scale, medication(s)/side effects and non-pharmacologic comfort measures Outcome: Progressing   

## 2020-11-22 NOTE — ED Notes (Addendum)
RN asked respiratory Pt placed on hospital bed for comfort.  Given a sandwich bag and diet Ginger ale.  Pt continues to feel dizzy but was able to stand and pivot to chair.

## 2020-11-23 DIAGNOSIS — N1831 Chronic kidney disease, stage 3a: Secondary | ICD-10-CM | POA: Diagnosis not present

## 2020-11-23 DIAGNOSIS — I5022 Chronic systolic (congestive) heart failure: Secondary | ICD-10-CM | POA: Diagnosis not present

## 2020-11-23 DIAGNOSIS — N179 Acute kidney failure, unspecified: Secondary | ICD-10-CM | POA: Diagnosis not present

## 2020-11-23 DIAGNOSIS — I959 Hypotension, unspecified: Secondary | ICD-10-CM | POA: Diagnosis not present

## 2020-11-23 LAB — BASIC METABOLIC PANEL
Anion gap: 8 (ref 5–15)
BUN: 33 mg/dL — ABNORMAL HIGH (ref 6–20)
CO2: 20 mmol/L — ABNORMAL LOW (ref 22–32)
Calcium: 9.5 mg/dL (ref 8.9–10.3)
Chloride: 110 mmol/L (ref 98–111)
Creatinine, Ser: 1.46 mg/dL — ABNORMAL HIGH (ref 0.61–1.24)
GFR, Estimated: 60 mL/min — ABNORMAL LOW (ref >60)
Glucose, Bld: 165 mg/dL — ABNORMAL HIGH (ref 70–99)
Potassium: 4.4 mmol/L (ref 3.5–5.1)
Sodium: 138 mmol/L (ref 135–145)

## 2020-11-23 LAB — GLUCOSE, CAPILLARY
Glucose-Capillary: 148 mg/dL — ABNORMAL HIGH (ref 70–99)
Glucose-Capillary: 244 mg/dL — ABNORMAL HIGH (ref 70–99)
Glucose-Capillary: 216 mg/dL — ABNORMAL HIGH (ref 70–99)
Glucose-Capillary: 216 mg/dL — ABNORMAL HIGH (ref 70–99)

## 2020-11-23 LAB — MAGNESIUM: Magnesium: 1.7 mg/dL (ref 1.7–2.4)

## 2020-11-23 MED ORDER — MAGNESIUM SULFATE 2 GM/50ML IV SOLN
2.0000 g | Freq: Once | INTRAVENOUS | Status: AC
  Administered 2020-11-23: 2 g via INTRAVENOUS
  Filled 2020-11-23: qty 50

## 2020-11-23 NOTE — Progress Notes (Signed)
PROGRESS NOTE    SIMUEL STEBNER  CBJ:[id number] DOB: 1976-04-23 DOA: 11/21/2020 PCP: [name], FNP   Brief Narrative:  Patient is 45 year old male with past medical history of an ICM with ejection fraction of 20 to 25% status post AICD placement, hypertension, type 2 diabetes mellitus, OSA on CPAP, paroxysmal A. fib on Eliquis, CKD stage III with prior RCC resection, morbid obesity tobacco abuse, marijuana abuse presents to emergency department with lightheadedness and dizziness and hypotension.  Upon arrival to ED: Patient's blood pressure noted to be 81/36 which improved to 96/60 1:05 0.5 L of IV fluid bolus.  Lactic acid: 3.3 and 3.7.  Creatinine: 2.4 up from 1.4 in December.  Patient admitted for further evaluation and management.  Assessment & Plan:  Lightheadedness/dizziness: -Likely in the setting of hypotension due to decreased p.o. intake versus polypharmacy and dehydration due to diarrhea and diuretics. -Patient was given IVF 1.5 L bolus in ED -Blood pressure improved.  Lactic acid trended down from 3.7-2.1-1.6 -Continue to hold diuretics and Entresto -Consult PT/OT  AKI on CKD stage IIIb: -Baseline creatinine: 1.4.  Creatinine on admission: 2.15. -Receive IV fluid bolus in ED. -Renal function improved this morning.  Creatinine 2.4-1.4.  GFR improved from 33-60. -Continue to hold diuretics and nephrotoxic medication and monitor renal function closely.  Paroxysmal A. Fib: -Rate controlled.  Continue Coreg and Eliquis and amiodarone -Monitor heart rate closely on telemetry  Chronic combined systolic and diastolic CHF/NICM: -Status post AICD placement - Reviewed echo from 9/2 shows ejection fraction of 20 to 25% with grade 1 diastolic dysfunction.  Chest x-ray negative for fluid overload.  BNP: WNL -Continue Coreg, hold Entresto, Aldactone, torsemide at this time -Strict INO's and daily weight.  Type 2 diabetes mellitus: -Uncontrolled.  Last A1c: 8.6% on  1/24 -Hold home meds.  Continue sliding scale insulin and Novolin 20 units twice daily and monitor blood sugar closely.    Hypomagnesemia: Replenished.  Repeat magnesium level tomorrow AM.  Diabetic neuropathy: Continue gabapentin  Depression: Continue Wellbutrin and trazodone  Tobacco abuse/marijuana abuse: Counseled about cessation  OSA: CPAP nightly  Morbid obesity with BMI of 53: -Diet modification/exercise and weight loss recommended  DVT prophylaxis: Eliquis Code Status: Full code Family Communication:  None present at bedside.  Plan of care discussed with patient in length and he verbalized understanding and agreed with it. Disposition Plan: Home in 1 day  Consultants:   None  Procedures:   None  Antimicrobials:   None  Status is: Inpatient  Dispo: The patient is from: Home              Anticipated d/c is to: Home              Anticipated d/c date is: 1 day              Patient currently is not medically stable to d/c.   Difficult to place patient No    Subjective: Patient seen and examined.  Resting comfortably on the bed with CPAP.  Tells me that he feels much better overall however he still feels some lightheaded and dizzy.  Denies chest pain, shortness of breath, palpitation or leg swelling.  Remained afebrile.  No diarrhea.  No acute events overnight.  Objective: Vitals:   11/22/20 2125 11/23/20 0121 11/23/20 0505 11/23/20 0957  BP: (!) 122/91 111/85 (!) 122/95 120/78  Pulse: 69 60 68 73  Resp:    16  Temp: 98.7 F (37.1 C) 98.9 F (37.2  C) (!) 97.3 F (36.3 C) 98.1 F (36.7 C)  TempSrc:  Oral Oral Oral  SpO2: 97% 96% 98% 97%  Weight:      Height:        Intake/Output Summary (Last 24 hours) at 11/23/2020 1022 Last data filed at 11/23/2020 0800 Gross per 24 hour  Intake 440 ml  Output 1500 ml  Net -1060 ml   Filed Weights   11/21/20 2149 11/22/20 1441  Weight: (!) 154.2 kg (!) 146.9 kg    Examination:  General exam: Appears calm  and comfortable, on CPAP, communicating well, obese, appears weak  respiratory system: Clear to auscultation. Respiratory effort normal. Cardiovascular system: S1 & S2 heard, RRR. No JVD, murmurs, rubs, gallops or clicks. No pedal edema. Gastrointestinal system: Abdomen is nondistended, soft and nontender. No organomegaly or masses felt. Normal bowel sounds heard. Central nervous system: Alert and oriented. No focal neurological deficits. Extremities: Symmetric 5 x 5 power. Skin: No rashes, lesions or ulcers Psychiatry: Judgement and insight appear normal. Mood & affect appropriate.    Data Reviewed: I have personally reviewed following labs and imaging studies  CBC: Recent Labs  Lab 11/21/20 2155 11/22/20 0820  WBC 7.7 6.6  HGB 13.9 13.2  HCT 43.8 40.3  MCV 79.2* 79.2*  PLT 313 767   Basic Metabolic Panel: Recent Labs  Lab 11/21/20 2155 11/22/20 0820 11/23/20 0638  NA 136 137 138  K 4.3 4.5 4.4  CL 105 106 110  CO2 18* 20* 20*  GLUCOSE 213* 240* 165*  BUN 47* 49* 33*  CREATININE 2.42* 2.15* 1.46*  CALCIUM 9.7 9.3 9.5  MG  --   --  1.7   GFR: Estimated Creatinine Clearance: 88 mL/min (A) (by C-G formula based on SCr of 1.46 mg/dL (H)). Liver Function Tests: No results for input(s): AST, ALT, ALKPHOS, BILITOT, PROT, ALBUMIN in the last 168 hours. No results for input(s): LIPASE, AMYLASE in the last 168 hours. No results for input(s): AMMONIA in the last 168 hours. Coagulation Profile: No results for input(s): INR, PROTIME in the last 168 hours. Cardiac Enzymes: No results for input(s): CKTOTAL, CKMB, CKMBINDEX, TROPONINI in the last 168 hours. BNP (last 3 results) No results for input(s): PROBNP in the last 8760 hours. HbA1C: No results for input(s): HGBA1C in the last 72 hours. CBG: Recent Labs  Lab 11/22/20 0840 11/22/20 1420 11/22/20 1655 11/22/20 2028 11/23/20 0641  GLUCAP 247* 123* 155* 162* 148*   Lipid Profile: No results for input(s): CHOL, HDL,  LDLCALC, TRIG, CHOLHDL, LDLDIRECT in the last 72 hours. Thyroid Function Tests: No results for input(s): TSH, T4TOTAL, FREET4, T3FREE, THYROIDAB in the last 72 hours. Anemia Panel: No results for input(s): VITAMINB12, FOLATE, FERRITIN, TIBC, IRON, RETICCTPCT in the last 72 hours. Sepsis Labs: Recent Labs  Lab 11/22/20 0021 11/22/20 0820 11/22/20 1622 11/22/20 1911  LATICACIDVEN 3.7* 2.1* 1.6 1.6    Recent Results (from the past 240 hour(s))  Resp Panel by RT-PCR (Flu A&B, Covid) Nasopharyngeal Swab     Status: None   Collection Time: 11/21/20 10:40 PM   Specimen: Nasopharyngeal Swab; Nasopharyngeal(NP) swabs in vial transport medium  Result Value Ref Range Status   SARS Coronavirus 2 by RT PCR NEGATIVE NEGATIVE Final    Comment: (NOTE) SARS-CoV-2 target nucleic acids are NOT DETECTED.  The SARS-CoV-2 RNA is generally detectable in upper respiratory specimens during the acute phase of infection. The lowest concentration of SARS-CoV-2 viral copies this assay can detect is 138 copies/mL. A negative  result does not preclude SARS-Cov-2 infection and should not be used as the sole basis for treatment or other patient management decisions. A negative result may occur with  improper specimen collection/handling, submission of specimen other than nasopharyngeal swab, presence of viral mutation(s) within the areas targeted by this assay, and inadequate number of viral copies(<138 copies/mL). A negative result must be combined with clinical observations, patient history, and epidemiological information. The expected result is Negative.  Fact Sheet for Patients:  EntrepreneurPulse.com.au  Fact Sheet for Healthcare Providers:  IncredibleEmployment.be  This test is no t yet approved or cleared by the [place] FDA and  has been authorized for detection and/or diagnosis of SARS-CoV-2 by FDA under an Emergency Use Authorization (EUA). This EUA will  remain  in effect (meaning this test can be used) for the duration of the COVID-19 declaration under Section 564(b)(1) of the Act, 21 U.S.C.section 360bbb-3(b)(1), unless the authorization is terminated  or revoked sooner.       Influenza A by PCR NEGATIVE NEGATIVE Final   Influenza B by PCR NEGATIVE NEGATIVE Final    Comment: (NOTE) The Xpert Xpress SARS-CoV-2/FLU/RSV plus assay is intended as an aid in the diagnosis of influenza from Nasopharyngeal swab specimens and should not be used as a sole basis for treatment. Nasal washings and aspirates are unacceptable for Xpert Xpress SARS-CoV-2/FLU/RSV testing.  Fact Sheet for Patients: EntrepreneurPulse.com.au  Fact Sheet for Healthcare Providers: IncredibleEmployment.be  This test is not yet approved or cleared by the [place] FDA and has been authorized for detection and/or diagnosis of SARS-CoV-2 by FDA under an Emergency Use Authorization (EUA). This EUA will remain in effect (meaning this test can be used) for the duration of the COVID-19 declaration under Section 564(b)(1) of the Act, 21 U.S.C. section 360bbb-3(b)(1), unless the authorization is terminated or revoked.  Performed at [facility], [place] [address] [place], [place] [address]       Radiology Studies: DG Chest Portable 1 View  Result Date: 11/21/2020 CLINICAL DATA:  Hypotension, dizziness EXAM: PORTABLE CHEST 1 VIEW COMPARISON:  Chest x-ray 05/17/2020, CT chest 12/28/2014 FINDINGS: Left chest for cardiac defibrillator in similar position. Cardiomegaly. The heart size and mediastinal contours are unchanged. Streaky airspace opacity at the right base. No focal consolidation. No pulmonary edema. No pleural effusion. No pneumothorax. No acute osseous abnormality. IMPRESSION: 1. Streaky airspace opacity at the right base that likely represents atelectasis. 2. Stable cardiomegaly. Electronically Signed   By: Iven Finn  M.D.   On: 11/21/2020 22:43    Scheduled Meds: . amiodarone  100 mg Oral Daily  . apixaban  5 mg Oral BID  . busPIRone  10 mg Oral BID  . carvedilol  6.25 mg Oral BID WC  . gabapentin  300 mg Oral TID  . insulin aspart  0-15 Units Subcutaneous TID WC  . insulin NPH Human  20 Units Subcutaneous BID AC & HS  . loratadine  10 mg Oral Daily  . traZODone  100 mg Oral QHS   Continuous Infusions: . magnesium sulfate bolus IVPB       LOS: 1 day   Time spent: 35 minutes  Rik Wadel Loann Quill, MD [facility]  If 7PM-7AM, please contact night-coverage [email or web address] 11/23/2020, 10:22 AM

## 2020-11-23 NOTE — Evaluation (Signed)
Physical Therapy Evaluation Patient Details Name: Jeffrey [name] MRN: [id number] DOB: [birth year] Today's Date: 11/23/2020   History of Present Illness  Patient presented to the hospital with syncope. It had been increasing over the past 3-4 days PMH: DMII, CKD, partial nephrectomy, anxirety, morbnid obesity  Clinical Impression  Patient appears to be at baseline mobility. He reported mild syncope upon standing but her reports it is improving. He has been walking in his room without assistance. His B/P increased upon sitting from 120/78 supine to 129/102 sitting. It increased to 135/104 in standing. He has no need for skilled therapy at this time.     Follow Up Recommendations No PT follow up    Equipment Recommendations    None   Recommendations for Other Services       Precautions / Restrictions Precautions Precautions: None Restrictions Weight Bearing Restrictions: No      Mobility  Bed Mobility Overal bed mobility: Independent             General bed mobility comments: did not require assist to get to edge of the bed. Has been moving in his room independently supine B/P 120/78    Transfers Overall transfer level: Independent               General transfer comment: Sitting edge of bed B/P 129/102 no syncope noted  Ambulation/Gait Ambulation/Gait assistance: Independent Gait Distance (Feet): 30 Feet         General Gait Details: Standing B/P 135/104. Mild syncope noted. Patient states " just a ittle but not bad"  Financial trader Rankin (Stroke Patients Only)       Balance                                             Pertinent Vitals/Pain Pain Assessment: No/denies pain    Home Living Family/patient expects to be discharged to:: Private residence Living Arrangements: Spouse/significant other;Children Available Help at Discharge: Family Type of Home: House Home Access: Stairs to  enter Entrance Stairs-Rails: Right Entrance Stairs-Number of Steps: 5 Home Layout: One level Home Equipment: None      Prior Function Level of Independence: Independent         Comments: did not use a device. No falls despite syncope     Hand Dominance   Dominant Hand: Right    Extremity/Trunk Assessment   Upper Extremity Assessment Upper Extremity Assessment: Defer to OT evaluation    Lower Extremity Assessment Lower Extremity Assessment: Overall WFL for tasks assessed    Cervical / Trunk Assessment Cervical / Trunk Assessment: Normal  Communication   Communication: No difficulties  Cognition Arousal/Alertness: Awake/alert Behavior During Therapy: WFL for tasks assessed/performed Overall Cognitive Status: Within Functional Limits for tasks assessed                                        General Comments      Exercises     Assessment/Plan    PT Assessment Patent does not need any further PT services  PT Problem List         PT Treatment Interventions      PT Goals (Current goals can be found in the  Care Plan section)  Acute Rehab PT Goals Patient Stated Goal: to go home PT Goal Formulation: With patient Time For Goal Achievement: 11/30/20 Potential to Achieve Goals: Good    Frequency     Barriers to discharge        Co-evaluation               AM-PAC PT "6 Clicks" Mobility  Outcome Measure Help needed turning from your back to your side while in a flat bed without using bedrails?: None Help needed moving from lying on your back to sitting on the side of a flat bed without using bedrails?: None Help needed moving to and from a bed to a chair (including a wheelchair)?: None Help needed standing up from a chair using your arms (e.g., wheelchair or bedside chair)?: None Help needed to walk in hospital room?: None Help needed climbing 3-5 steps with a railing? : None 6 Click Score: 24    End of Session   Activity  Tolerance: Patient tolerated treatment well Patient left: in chair;with call [place]/phone within reach Nurse Communication: Mobility status      Time: 0950-1008 PT Time Calculation (min) (ACUTE ONLY): 18 min   Charges:   PT Evaluation $PT Eval Low Complexity: 1 Low            Carney Living PT DPT  11/23/2020, 10:26 AM

## 2020-11-23 NOTE — Progress Notes (Signed)
  Nutrition Brief Note  Patient identified on the Malnutrition Screening Tool (MST) Report  RD working remotely.  Wt Readings from Last 15 Encounters:  11/22/20 (!) 146.9 kg  09/16/20 (!) 149.2 kg  09/04/20 (!) 149.8 kg  08/07/20 (!) 148.8 kg  06/26/20 (!) 147.9 kg  06/24/20 (!) 146.8 kg  05/29/20 (!) 148.9 kg  05/17/20 (!) 149.6 kg  04/25/20 (!) 154.2 kg  03/19/20 (!) 152 kg  12/18/19 (!) 148.2 kg  12/08/19 (!) 149.9 kg  09/18/19 (!) 150 kg  07/19/19 (!) 140.6 kg  06/19/19 (!) 142.7 kg    Body mass index is 50.72 kg/m. Patient meets criteria for morbid based on current BMI.   Current diet order is HH/CM, patient is consuming approximately 75-100% of meals at this time. Labs and medications reviewed.   No nutrition interventions warranted at this time. If nutrition issues arise, please consult RD.   Lajuan Lines, RD, LDN Clinical Nutrition After Hours/Weekend Pager # in [place]

## 2020-11-23 NOTE — Plan of Care (Signed)
  Problem: Health Behavior/Discharge Planning: Goal: Ability to manage health-related needs will improve Outcome: Progressing   Problem: Activity: Goal: Risk for activity intolerance will decrease Outcome: Progressing   

## 2020-11-24 DIAGNOSIS — I5022 Chronic systolic (congestive) heart failure: Secondary | ICD-10-CM | POA: Diagnosis not present

## 2020-11-24 DIAGNOSIS — N1831 Chronic kidney disease, stage 3a: Secondary | ICD-10-CM | POA: Diagnosis not present

## 2020-11-24 DIAGNOSIS — I959 Hypotension, unspecified: Secondary | ICD-10-CM | POA: Diagnosis not present

## 2020-11-24 DIAGNOSIS — N179 Acute kidney failure, unspecified: Secondary | ICD-10-CM | POA: Diagnosis not present

## 2020-11-24 LAB — MAGNESIUM: Magnesium: 1.8 mg/dL (ref 1.7–2.4)

## 2020-11-24 LAB — BASIC METABOLIC PANEL
Anion gap: 7 (ref 5–15)
BUN: 19 mg/dL (ref 6–20)
CO2: 21 mmol/L — ABNORMAL LOW (ref 22–32)
Calcium: 9.2 mg/dL (ref 8.9–10.3)
Chloride: 109 mmol/L (ref 98–111)
Creatinine, Ser: 1.25 mg/dL — ABNORMAL HIGH (ref 0.61–1.24)
GFR, Estimated: >60 mL/min (ref >60)
Glucose, Bld: 170 mg/dL — ABNORMAL HIGH (ref 70–99)
Potassium: 4.1 mmol/L (ref 3.5–5.1)
Sodium: 137 mmol/L (ref 135–145)

## 2020-11-24 LAB — TROPONIN I (HIGH SENSITIVITY)
Troponin I (High Sensitivity): 26 ng/L — ABNORMAL HIGH (ref <18)
Troponin I (High Sensitivity): 27 ng/L — ABNORMAL HIGH (ref <18)
Troponin I (High Sensitivity): 27 ng/L — ABNORMAL HIGH (ref <18)

## 2020-11-24 LAB — GLUCOSE, CAPILLARY
Glucose-Capillary: 156 mg/dL — ABNORMAL HIGH (ref 70–99)
Glucose-Capillary: 191 mg/dL — ABNORMAL HIGH (ref 70–99)
Glucose-Capillary: 200 mg/dL — ABNORMAL HIGH (ref 70–99)
Glucose-Capillary: 189 mg/dL — ABNORMAL HIGH (ref 70–99)

## 2020-11-24 LAB — URIC ACID: Uric Acid, Serum: 10.8 mg/dL — ABNORMAL HIGH (ref 3.7–8.6)

## 2020-11-24 MED ORDER — NITROGLYCERIN 0.4 MG SL SUBL: 0.4000 mg | SUBLINGUAL_TABLET | SUBLINGUAL | Status: DC | PRN

## 2020-11-24 MED ORDER — TORSEMIDE 20 MG PO TABS
40.0000 mg | ORAL_TABLET | Freq: Every day | ORAL | Status: DC
  Administered 2020-11-24 – 2020-11-25 (×2): 40 mg via ORAL
  Filled 2020-11-24 (×2): qty 2

## 2020-11-24 MED ORDER — ISOSORB DINITRATE-HYDRALAZINE 20-37.5 MG PO TABS
2.0000 | ORAL_TABLET | Freq: Three times a day (TID) | ORAL | Status: DC
  Administered 2020-11-24 – 2020-11-26 (×7): 2 via ORAL
  Filled 2020-11-24 (×7): qty 2

## 2020-11-24 MED ORDER — SPIRONOLACTONE 12.5 MG HALF TABLET
12.5000 mg | ORAL_TABLET | Freq: Every day | ORAL | Status: DC
  Administered 2020-11-24: 12.5 mg via ORAL
  Filled 2020-11-24 (×2): qty 1

## 2020-11-24 MED ORDER — ALLOPURINOL 100 MG PO TABS
100.0000 mg | ORAL_TABLET | Freq: Every day | ORAL | Status: DC
  Administered 2020-11-24 – 2020-11-25 (×2): 100 mg via ORAL
  Filled 2020-11-24 (×2): qty 1

## 2020-11-24 MED ORDER — SACUBITRIL-VALSARTAN 97-103 MG PO TABS
1.0000 | ORAL_TABLET | Freq: Two times a day (BID) | ORAL | Status: DC
  Administered 2020-11-24 – 2020-11-25 (×3): 1 via ORAL
  Filled 2020-11-24 (×4): qty 1

## 2020-11-24 MED ORDER — MAGNESIUM SULFATE 2 GM/50ML IV SOLN
2.0000 g | Freq: Once | INTRAVENOUS | Status: AC
  Administered 2020-11-24: 2 g via INTRAVENOUS
  Filled 2020-11-24: qty 50

## 2020-11-24 MED ORDER — COLCHICINE 0.6 MG PO TABS
0.6000 mg | ORAL_TABLET | Freq: Two times a day (BID) | ORAL | Status: DC | PRN
  Administered 2020-11-24: 0.6 mg via ORAL
  Filled 2020-11-24: qty 1

## 2020-11-24 MED ORDER — COLCHICINE 0.6 MG PO CAPS: 1.0000 | ORAL_CAPSULE | Freq: Two times a day (BID) | ORAL | Status: DC | PRN

## 2020-11-24 NOTE — Progress Notes (Signed)
T places self on/off cpap as needed.  [pt has requested a large mask at this time d/t slippage}.  Rt will fit for pt before leaving.

## 2020-11-24 NOTE — Progress Notes (Signed)
PROGRESS NOTE    Jeffrey [name]  ERD:[id number] DOB: 27-Jul-1976 DOA: 11/21/2020 PCP: [name], FNP   Brief Narrative:  Patient is 45 year old male with past medical history of an ICM with ejection fraction of 20 to 25% status post AICD placement, hypertension, type 2 diabetes mellitus, OSA on CPAP, paroxysmal A. fib on Eliquis, CKD stage III with prior RCC resection, morbid obesity tobacco abuse, marijuana abuse presents to emergency department with lightheadedness and dizziness and hypotension.  Upon arrival to ED: Patient's blood pressure noted to be 81/36 which improved to 96/60 1:05 0.5 L of IV fluid bolus.  Lactic acid: 3.3 and 3.7.  Creatinine: 2.4 up from 1.4 in December.  Patient admitted for further evaluation and management.  Assessment & Plan:  Lightheadedness/dizziness: -Likely in the setting of hypotension due to decreased p.o. intake versus polypharmacy and dehydration due to diarrhea and diuretics. -Patient was given IVF 1.5 L bolus in ED -Blood pressure improved.  Lactic acid trended down from 3.7-2.1-1.6 -Orthostatic vitals: Negative -Consult PT recommended no PT follow-up  Chest discomfort: -Troponin flat 26-27. EKG obtained shows normal sinus rhythm, nonspecific T wave changes (no new changes compared to previous EKG) -Trend troponin. Monitor closely on telemetry. Added nitro as needed for chest pain  AKI on CKD stage IIIb: -Baseline creatinine: 1.4.  Creatinine on admission: 2.15. -Receive IV fluid bolus in ED. -Renal function improved this morning.  Creatinine 2.4-1.4-1.2.  Paroxysmal A. Fib: -Rate controlled.  Continue Coreg and Eliquis and amiodarone -Monitor heart rate closely on telemetry  Chronic combined systolic and diastolic CHF/NICM: -Status post AICD placement - Reviewed echo from 9/2 shows ejection fraction of 20 to 25% with grade 1 diastolic dysfunction.  Chest x-ray negative for fluid overload.  BNP: WNL -Continue Coreg, resume Entresto,  Aldactone, torsemide today -Strict INO's and daily weight.  Type 2 diabetes mellitus: -Uncontrolled with hyperglycemia.  Last A1c: 8.6% on 1/24 -Hold home meds.  Continue sliding scale insulin and Novolin 20 units twice daily and monitor blood sugar closely.    Hypomagnesemia: Magnesium level: 1.8. Replenished.  Repeat magnesium level tomorrow AM.  Diabetic neuropathy: Continue gabapentin  Depression: Continue Wellbutrin and trazodone  Tobacco abuse/marijuana abuse: Counseled about cessation  OSA: CPAP nightly  Morbid obesity with BMI of 53: -Diet modification/exercise and weight loss recommended  DVT prophylaxis: Eliquis Code Status: Full code Family Communication:  None present at bedside.  Plan of care discussed with patient in length and he verbalized understanding and agreed with it. Disposition Plan: Home in 1 day  Consultants:   None  Procedures:   None  Antimicrobials:   None  Status is: Inpatient  Dispo: The patient is from: Home              Anticipated d/c is to: Home              Anticipated d/c date is: 1 day              Patient currently is not medically stable to d/c.   Difficult to place patient No    Subjective: Patient seen and examined. Complaining of chest discomfort this morning associated with shortness of breath. No nausea, vomiting, lightheadedness or dizziness reported. Objective: Vitals:   11/23/20 0957 11/23/20 1714 11/23/20 2138 11/24/20 0920  BP: 120/78 (!) 122/93 (!) 125/97 (!) 132/96  Pulse: 73 64 70 70  Resp: 16 18  18   Temp: 98.1 F (36.7 C) 97.7 F (36.5 C) 98.4 F (36.9 C) 97.8  F (36.6 C)  TempSrc: Oral Oral Oral Oral  SpO2: 97% 99% 93% 97%  Weight:      Height:        Intake/Output Summary (Last 24 hours) at 11/24/2020 1340 Last data filed at 11/24/2020 1239 Gross per 24 hour  Intake 770 ml  Output 1200 ml  Net -430 ml   Filed Weights   11/21/20 2149 11/22/20 1441  Weight: (!) 154.2 kg (!) 146.9 kg     Examination:  General exam: Appears calm and comfortable, on room air, communicating well, obese, appears weak  respiratory system: Clear to auscultation. Respiratory effort normal. Cardiovascular system: S1 & S2 heard, RRR. No JVD, murmurs, rubs, gallops or clicks. No pedal edema. Gastrointestinal system: Abdomen is nondistended, soft and nontender. No organomegaly or masses felt. Normal bowel sounds heard. Central nervous system: Alert and oriented. No focal neurological deficits. Extremities: Symmetric 5 x 5 power. Skin: No rashes, lesions or ulcers Psychiatry: Judgement and insight appear normal. Mood & affect appropriate.    Data Reviewed: I have personally reviewed following labs and imaging studies  CBC: Recent Labs  Lab 11/21/20 2155 11/22/20 0820  WBC 7.7 6.6  HGB 13.9 13.2  HCT 43.8 40.3  MCV 79.2* 79.2*  PLT 313 270   Basic Metabolic Panel: Recent Labs  Lab 11/21/20 2155 11/22/20 0820 11/23/20 0638 11/24/20 0428  NA 136 137 138 137  K 4.3 4.5 4.4 4.1  CL 105 106 110 109  CO2 18* 20* 20* 21*  GLUCOSE 213* 240* 165* 170*  BUN 47* 49* 33* 19  CREATININE 2.42* 2.15* 1.46* 1.25*  CALCIUM 9.7 9.3 9.5 9.2  MG  --   --  1.7 1.8   GFR: Estimated Creatinine Clearance: 102.8 mL/min (A) (by C-G formula based on SCr of 1.25 mg/dL (H)). Liver Function Tests: No results for input(s): AST, ALT, ALKPHOS, BILITOT, PROT, ALBUMIN in the last 168 hours. No results for input(s): LIPASE, AMYLASE in the last 168 hours. No results for input(s): AMMONIA in the last 168 hours. Coagulation Profile: No results for input(s): INR, PROTIME in the last 168 hours. Cardiac Enzymes: No results for input(s): CKTOTAL, CKMB, CKMBINDEX, TROPONINI in the last 168 hours. BNP (last 3 results) No results for input(s): PROBNP in the last 8760 hours. HbA1C: No results for input(s): HGBA1C in the last 72 hours. CBG: Recent Labs  Lab 11/23/20 1201 11/23/20 1709 11/23/20 2135  11/24/20 0635 11/24/20 1115  GLUCAP 244* 216* 216* 156* 191*   Lipid Profile: No results for input(s): CHOL, HDL, LDLCALC, TRIG, CHOLHDL, LDLDIRECT in the last 72 hours. Thyroid Function Tests: No results for input(s): TSH, T4TOTAL, FREET4, T3FREE, THYROIDAB in the last 72 hours. Anemia Panel: No results for input(s): VITAMINB12, FOLATE, FERRITIN, TIBC, IRON, RETICCTPCT in the last 72 hours. Sepsis Labs: Recent Labs  Lab 11/22/20 0021 11/22/20 0820 11/22/20 1622 11/22/20 1911  LATICACIDVEN 3.7* 2.1* 1.6 1.6    Recent Results (from the past 240 hour(s))  Resp Panel by RT-PCR (Flu A&B, Covid) Nasopharyngeal Swab     Status: None   Collection Time: 11/21/20 10:40 PM   Specimen: Nasopharyngeal Swab; Nasopharyngeal(NP) swabs in vial transport medium  Result Value Ref Range Status   SARS Coronavirus 2 by RT PCR NEGATIVE NEGATIVE Final    Comment: (NOTE) SARS-CoV-2 target nucleic acids are NOT DETECTED.  The SARS-CoV-2 RNA is generally detectable in upper respiratory specimens during the acute phase of infection. The lowest concentration of SARS-CoV-2 viral copies this assay can detect  is 138 copies/mL. A negative result does not preclude SARS-Cov-2 infection and should not be used as the sole basis for treatment or other patient management decisions. A negative result may occur with  improper specimen collection/handling, submission of specimen other than nasopharyngeal swab, presence of viral mutation(s) within the areas targeted by this assay, and inadequate number of viral copies(<138 copies/mL). A negative result must be combined with clinical observations, patient history, and epidemiological information. The expected result is Negative.  Fact Sheet for Patients:  EntrepreneurPulse.com.au  Fact Sheet for Healthcare Providers:  IncredibleEmployment.be  This test is no t yet approved or cleared by the [place] FDA and  has been  authorized for detection and/or diagnosis of SARS-CoV-2 by FDA under an Emergency Use Authorization (EUA). This EUA will remain  in effect (meaning this test can be used) for the duration of the COVID-19 declaration under Section 564(b)(1) of the Act, 21 U.S.C.section 360bbb-3(b)(1), unless the authorization is terminated  or revoked sooner.       Influenza A by PCR NEGATIVE NEGATIVE Final   Influenza B by PCR NEGATIVE NEGATIVE Final    Comment: (NOTE) The Xpert Xpress SARS-CoV-2/FLU/RSV plus assay is intended as an aid in the diagnosis of influenza from Nasopharyngeal swab specimens and should not be used as a sole basis for treatment. Nasal washings and aspirates are unacceptable for Xpert Xpress SARS-CoV-2/FLU/RSV testing.  Fact Sheet for Patients: EntrepreneurPulse.com.au  Fact Sheet for Healthcare Providers: IncredibleEmployment.be  This test is not yet approved or cleared by the [place] FDA and has been authorized for detection and/or diagnosis of SARS-CoV-2 by FDA under an Emergency Use Authorization (EUA). This EUA will remain in effect (meaning this test can be used) for the duration of the COVID-19 declaration under Section 564(b)(1) of the Act, 21 U.S.C. section 360bbb-3(b)(1), unless the authorization is terminated or revoked.  Performed at [facility], [place] [address], [place], [place] [address]       Radiology Studies: No results found.  Scheduled Meds: . amiodarone  100 mg Oral Daily  . apixaban  5 mg Oral BID  . busPIRone  10 mg Oral BID  . carvedilol  6.25 mg Oral BID WC  . gabapentin  300 mg Oral TID  . insulin aspart  0-15 Units Subcutaneous TID WC  . insulin NPH Human  20 Units Subcutaneous BID AC & HS  . isosorbide-hydrALAZINE  2 tablet Oral TID  . loratadine  10 mg Oral Daily  . sacubitril-valsartan  1 tablet Oral BID  . spironolactone  12.5 mg Oral Daily  . torsemide  40 mg Oral Daily  .  traZODone  100 mg Oral QHS   Continuous Infusions:    LOS: 2 days   Time spent: 35 minutes  Frederick Klinger Loann Quill, MD [facility]  If 7PM-7AM, please contact night-coverage [email or web address] 11/24/2020, 1:40 PM

## 2020-11-24 NOTE — Progress Notes (Signed)
OT Cancellation Note  Patient Details Name: Jeffrey [name] MRN: [id number] DOB: 20-Jul-1976   Cancelled Treatment:    Reason Eval/Treat Not Completed: OT screened, no needs identified, will sign off.  Pt up in room completing bathing and dressing independently.  Spoke with him - no OT needs identified.  Nilsa Nutting., OTR/L Acute Rehabilitation Services Pager [phone] Office [phone]   Lucille Passy M 11/24/2020, 2:33 PM

## 2020-11-24 NOTE — Plan of Care (Signed)
  Problem: Health Behavior/Discharge Planning: Goal: Ability to manage health-related needs will improve Outcome: Progressing   Problem: Clinical Measurements: Goal: Cardiovascular complication will be avoided Outcome: Progressing

## 2020-11-25 DIAGNOSIS — I959 Hypotension, unspecified: Secondary | ICD-10-CM | POA: Diagnosis not present

## 2020-11-25 DIAGNOSIS — N179 Acute kidney failure, unspecified: Secondary | ICD-10-CM | POA: Diagnosis not present

## 2020-11-25 DIAGNOSIS — N1831 Chronic kidney disease, stage 3a: Secondary | ICD-10-CM | POA: Diagnosis not present

## 2020-11-25 DIAGNOSIS — I5022 Chronic systolic (congestive) heart failure: Secondary | ICD-10-CM | POA: Diagnosis not present

## 2020-11-25 LAB — MAGNESIUM: Magnesium: 1.8 mg/dL (ref 1.7–2.4)

## 2020-11-25 LAB — BASIC METABOLIC PANEL
Anion gap: 9 (ref 5–15)
BUN: 30 mg/dL — ABNORMAL HIGH (ref 6–20)
CO2: 21 mmol/L — ABNORMAL LOW (ref 22–32)
Calcium: 8.8 mg/dL — ABNORMAL LOW (ref 8.9–10.3)
Chloride: 104 mmol/L (ref 98–111)
Creatinine, Ser: 2.28 mg/dL — ABNORMAL HIGH (ref 0.61–1.24)
GFR, Estimated: 35 mL/min — ABNORMAL LOW (ref >60)
Glucose, Bld: 269 mg/dL — ABNORMAL HIGH (ref 70–99)
Potassium: 4.5 mmol/L (ref 3.5–5.1)
Sodium: 134 mmol/L — ABNORMAL LOW (ref 135–145)

## 2020-11-25 LAB — GLUCOSE, CAPILLARY
Glucose-Capillary: 222 mg/dL — ABNORMAL HIGH (ref 70–99)
Glucose-Capillary: 268 mg/dL — ABNORMAL HIGH (ref 70–99)
Glucose-Capillary: 174 mg/dL — ABNORMAL HIGH (ref 70–99)
Glucose-Capillary: 471 mg/dL — ABNORMAL HIGH (ref 70–99)

## 2020-11-25 MED ORDER — INSULIN ASPART 100 UNIT/ML ~~LOC~~ SOLN
4.0000 [IU] | Freq: Three times a day (TID) | SUBCUTANEOUS | Status: DC
  Administered 2020-11-25 – 2020-11-26 (×3): 4 [IU] via SUBCUTANEOUS

## 2020-11-25 MED ORDER — OXYCODONE HCL 5 MG PO TABS
10.0000 mg | ORAL_TABLET | ORAL | Status: DC | PRN
  Administered 2020-11-25 – 2020-11-26 (×3): 10 mg via ORAL
  Filled 2020-11-25 (×4): qty 2

## 2020-11-25 MED ORDER — MAGNESIUM SULFATE 2 GM/50ML IV SOLN
2.0000 g | Freq: Once | INTRAVENOUS | Status: AC
  Administered 2020-11-25: 2 g via INTRAVENOUS
  Filled 2020-11-25: qty 50

## 2020-11-25 MED ORDER — PREDNISONE 20 MG PO TABS
40.0000 mg | ORAL_TABLET | Freq: Every day | ORAL | Status: DC
  Administered 2020-11-25 – 2020-11-26 (×2): 40 mg via ORAL
  Filled 2020-11-25 (×2): qty 2

## 2020-11-25 NOTE — Progress Notes (Signed)
PROGRESS NOTE    Jeffrey [name]  CBJ:[id number] DOB: Feb 21, 1976 DOA: 11/21/2020 PCP: [name], FNP   Brief Narrative:  Patient is 45 year old male with past medical history of an ICM with ejection fraction of 20 to 25% status post AICD placement, hypertension, type 2 diabetes mellitus, OSA on CPAP, paroxysmal A. fib on Eliquis, CKD stage III with prior RCC resection, morbid obesity tobacco abuse, marijuana abuse presents to emergency department with lightheadedness and dizziness and hypotension.  Upon arrival to ED: Patient's blood pressure noted to be 81/36 which improved to 96/60 1:05 0.5 L of IV fluid bolus.  Lactic acid: 3.3 and 3.7.  Creatinine: 2.4 up from 1.4 in December.  Patient admitted for further evaluation and management.  Assessment & Plan:  Lightheadedness/dizziness: -Likely in the setting of hypotension due to decreased p.o. intake versus polypharmacy and dehydration due to diarrhea and diuretics. -Patient was given IVF 1.5 L bolus in ED -Blood pressure improved.  Lactic acid trended down from 3.7-2.1-1.6 -Orthostatic vitals: Negative -Consult PT recommended no PT follow-up  Chest discomfort: Resolved -Troponin flat 26-27. EKG obtained shows normal sinus rhythm, nonspecific T wave changes (no new changes compared to previous EKG) -Trend troponin. Monitor closely on telemetry. Added nitro as needed for chest pain  Acute gout flare:  -In right great toe.  Hold colchicine and allopurinol in the setting of worsening renal function.  Started prednisone 40 mg daily for 5 days.  AKI on CKD stage IIIb: -Renal function declined this morning.  Hold Entresto, torsemide, Aldactone and colchicine.  Monitor renal function closely  Paroxysmal A. Fib: -Rate controlled.  Continue Coreg and Eliquis and amiodarone -Monitor heart rate closely on telemetry  Chronic combined systolic and diastolic CHF/NICM: -Status post AICD placement - Reviewed echo from 9/2 shows ejection  fraction of 20 to 25% with grade 1 diastolic dysfunction.  Chest x-ray negative for fluid overload.  BNP: WNL -Continue Coreg, resume Entresto, Aldactone, torsemide today -Strict INO's and daily weight.  Type 2 diabetes mellitus: -Uncontrolled with hyperglycemia.  Last A1c: 8.6% on 1/24 -Hold home meds.  Continue sliding scale insulin and Novolin 20 units twice daily, added NovoLog 4 units 3 times daily with meals.  And monitor blood sugar closely.    Hypomagnesemia: Magnesium level: 1.8. Replenished.  Repeat magnesium level tomorrow AM.  Diabetic neuropathy: Continue gabapentin  Depression: Continue Wellbutrin and trazodone  Tobacco abuse/marijuana abuse: Counseled about cessation  OSA: CPAP nightly  Morbid obesity with BMI of 53: -Diet modification/exercise and weight loss recommended  DVT prophylaxis: Eliquis Code Status: Full code Family Communication:  None present at bedside.  Plan of care discussed with patient in length and he verbalized understanding and agreed with it. Disposition Plan: Home in 1 day  Consultants:   None  Procedures:   None  Antimicrobials:   None  Status is: Inpatient  Dispo: The patient is from: Home              Anticipated d/c is to: Home              Anticipated d/c date is: 1 day              Patient currently is not medically stable to d/c.   Difficult to place patient No    Subjective: Seen and examined.  Reports some brief episode of lightheadedness when he took his morning medications.  His chest discomfort has resolved.  Continues to have right great toe severe pain due to gout.  Denies shortness of breath, leg swelling, orthopnea or PND.  No acute events overnight.   objective: Vitals:   11/24/20 1739 11/24/20 2009 11/24/20 2131 11/25/20 0850  BP: 101/81 117/76  121/86  Pulse: (!) 57 77 71 64  Resp: 16  18 20   Temp: 98 F (36.7 C) 98.2 F (36.8 C)  97.6 F (36.4 C)  TempSrc: Oral (P) Oral  Oral  SpO2: 98% 96% 95%  96%  Weight:      Height:        Intake/Output Summary (Last 24 hours) at 11/25/2020 1422 Last data filed at 11/25/2020 1354 Gross per 24 hour  Intake 960 ml  Output 2700 ml  Net -1740 ml   Filed Weights   11/21/20 2149 11/22/20 1441  Weight: (!) 154.2 kg (!) 146.9 kg    Examination:  General exam: Appears calm and comfortable, on room air, communicating well, obese, appears weak  respiratory system: Clear to auscultation. Respiratory effort normal. Cardiovascular system: S1 & S2 heard, RRR. No JVD, murmurs, rubs, gallops or clicks. No pedal edema. Gastrointestinal system: Abdomen is nondistended, soft and nontender. No organomegaly or masses felt. Normal bowel sounds heard. Central nervous system: Alert and oriented. No focal neurological deficits. Extremities: Right great toe: Tenderness positive.. Skin: No rashes, lesions or ulcers Psychiatry: Judgement and insight appear normal. Mood & affect appropriate.    Data Reviewed: I have personally reviewed following labs and imaging studies  CBC: Recent Labs  Lab 11/21/20 2155 11/22/20 0820  WBC 7.7 6.6  HGB 13.9 13.2  HCT 43.8 40.3  MCV 79.2* 79.2*  PLT 313 854   Basic Metabolic Panel: Recent Labs  Lab 11/21/20 2155 11/22/20 0820 11/23/20 0638 11/24/20 0428 11/25/20 0355  NA 136 137 138 137 134*  K 4.3 4.5 4.4 4.1 4.5  CL 105 106 110 109 104  CO2 18* 20* 20* 21* 21*  GLUCOSE 213* 240* 165* 170* 269*  BUN 47* 49* 33* 19 30*  CREATININE 2.42* 2.15* 1.46* 1.25* 2.28*  CALCIUM 9.7 9.3 9.5 9.2 8.8*  MG  --   --  1.7 1.8 1.8   GFR: Estimated Creatinine Clearance: 56.3 mL/min (A) (by C-G formula based on SCr of 2.28 mg/dL (H)). Liver Function Tests: No results for input(s): AST, ALT, ALKPHOS, BILITOT, PROT, ALBUMIN in the last 168 hours. No results for input(s): LIPASE, AMYLASE in the last 168 hours. No results for input(s): AMMONIA in the last 168 hours. Coagulation Profile: No results for input(s): INR,  PROTIME in the last 168 hours. Cardiac Enzymes: No results for input(s): CKTOTAL, CKMB, CKMBINDEX, TROPONINI in the last 168 hours. BNP (last 3 results) No results for input(s): PROBNP in the last 8760 hours. HbA1C: No results for input(s): HGBA1C in the last 72 hours. CBG: Recent Labs  Lab 11/24/20 1115 11/24/20 1655 11/24/20 2010 11/25/20 0656 11/25/20 1159  GLUCAP 191* 200* 189* 222* 268*   Lipid Profile: No results for input(s): CHOL, HDL, LDLCALC, TRIG, CHOLHDL, LDLDIRECT in the last 72 hours. Thyroid Function Tests: No results for input(s): TSH, T4TOTAL, FREET4, T3FREE, THYROIDAB in the last 72 hours. Anemia Panel: No results for input(s): VITAMINB12, FOLATE, FERRITIN, TIBC, IRON, RETICCTPCT in the last 72 hours. Sepsis Labs: Recent Labs  Lab 11/22/20 0021 11/22/20 0820 11/22/20 1622 11/22/20 1911  LATICACIDVEN 3.7* 2.1* 1.6 1.6    Recent Results (from the past 240 hour(s))  Resp Panel by RT-PCR (Flu A&B, Covid) Nasopharyngeal Swab     Status: None   Collection Time:  11/21/20 10:40 PM   Specimen: Nasopharyngeal Swab; Nasopharyngeal(NP) swabs in vial transport medium  Result Value Ref Range Status   SARS Coronavirus 2 by RT PCR NEGATIVE NEGATIVE Final    Comment: (NOTE) SARS-CoV-2 target nucleic acids are NOT DETECTED.  The SARS-CoV-2 RNA is generally detectable in upper respiratory specimens during the acute phase of infection. The lowest concentration of SARS-CoV-2 viral copies this assay can detect is 138 copies/mL. A negative result does not preclude SARS-Cov-2 infection and should not be used as the sole basis for treatment or other patient management decisions. A negative result may occur with  improper specimen collection/handling, submission of specimen other than nasopharyngeal swab, presence of viral mutation(s) within the areas targeted by this assay, and inadequate number of viral copies(<138 copies/mL). A negative result must be combined  with clinical observations, patient history, and epidemiological information. The expected result is Negative.  Fact Sheet for Patients:  EntrepreneurPulse.com.au  Fact Sheet for Healthcare Providers:  IncredibleEmployment.be  This test is no t yet approved or cleared by the [place] FDA and  has been authorized for detection and/or diagnosis of SARS-CoV-2 by FDA under an Emergency Use Authorization (EUA). This EUA will remain  in effect (meaning this test can be used) for the duration of the COVID-19 declaration under Section 564(b)(1) of the Act, 21 U.S.C.section 360bbb-3(b)(1), unless the authorization is terminated  or revoked sooner.       Influenza A by PCR NEGATIVE NEGATIVE Final   Influenza B by PCR NEGATIVE NEGATIVE Final    Comment: (NOTE) The Xpert Xpress SARS-CoV-2/FLU/RSV plus assay is intended as an aid in the diagnosis of influenza from Nasopharyngeal swab specimens and should not be used as a sole basis for treatment. Nasal washings and aspirates are unacceptable for Xpert Xpress SARS-CoV-2/FLU/RSV testing.  Fact Sheet for Patients: EntrepreneurPulse.com.au  Fact Sheet for Healthcare Providers: IncredibleEmployment.be  This test is not yet approved or cleared by the [place] FDA and has been authorized for detection and/or diagnosis of SARS-CoV-2 by FDA under an Emergency Use Authorization (EUA). This EUA will remain in effect (meaning this test can be used) for the duration of the COVID-19 declaration under Section 564(b)(1) of the Act, 21 U.S.C. section 360bbb-3(b)(1), unless the authorization is terminated or revoked.  Performed at [facility], [place] [address] [place], [place] [address]       Radiology Studies: No results found.  Scheduled Meds: . allopurinol  100 mg Oral Daily  . amiodarone  100 mg Oral Daily  . apixaban  5 mg Oral BID  . busPIRone  10 mg  Oral BID  . carvedilol  6.25 mg Oral BID WC  . gabapentin  300 mg Oral TID  . insulin aspart  0-15 Units Subcutaneous TID WC  . insulin aspart  4 Units Subcutaneous TID WC  . insulin NPH Human  20 Units Subcutaneous BID AC & HS  . isosorbide-hydrALAZINE  2 tablet Oral TID  . loratadine  10 mg Oral Daily  . sacubitril-valsartan  1 tablet Oral BID  . torsemide  40 mg Oral Daily  . traZODone  100 mg Oral QHS   Continuous Infusions:    LOS: 3 days   Time spent: 35 minutes  Delaney Schnick Loann Quill, MD [facility]  If 7PM-7AM, please contact night-coverage [email or web address] 11/25/2020, 2:22 PM

## 2020-11-25 NOTE — Progress Notes (Signed)
Inpatient Diabetes Program Recommendations  AACE/ADA: New Consensus Statement on Inpatient Glycemic Control (2015)  Target Ranges:  Prepandial:   less than 140 mg/dL      Peak postprandial:   less than 180 mg/dL (1-2 hours)      Critically ill patients:  140 - 180 mg/dL   Results for MORRILL, BOMKAMP (MRN [id number]) as of 11/25/2020 12:57  Ref. Range 11/24/2020 06:35 11/24/2020 11:15 11/24/2020 16:55 11/24/2020 20:10 11/25/2020 06:56 11/25/2020 11:59  Glucose-Capillary Latest Ref Range: 70 - 99 mg/dL 156 (H) 191 (H) 200 (H) 189 (H) 222 (H) 268 (H)   Review of Glycemic Control  Diabetes history: DM2 Outpatient Diabetes medications: 70/30 38 units BID, Novolog 0-15 units TID, Jardiance 10 mg QAM, Glipizide 10 mg BID, Metformin 1000 mg BID Current orders for Inpatient glycemic control: NPH 20 units BID, Novolog 0-15 units TID with meals  Inpatient Diabetes Program Recommendations:    Insulin: Please consider ordering Novolog 4 units TID with meals for meal coverage if patient eats at least 50% of meals.  Thanks, Barnie Alderman, RN, MSN, CDE Diabetes Coordinator Inpatient Diabetes Program [phone] (Team Pager from 8am to 5pm)

## 2020-11-26 ENCOUNTER — Other Ambulatory Visit (HOSPITAL_COMMUNITY): Payer: Self-pay | Admitting: Internal Medicine

## 2020-11-26 DIAGNOSIS — I959 Hypotension, unspecified: Secondary | ICD-10-CM | POA: Diagnosis not present

## 2020-11-26 DIAGNOSIS — N1831 Chronic kidney disease, stage 3a: Secondary | ICD-10-CM | POA: Diagnosis not present

## 2020-11-26 DIAGNOSIS — I5022 Chronic systolic (congestive) heart failure: Secondary | ICD-10-CM | POA: Diagnosis not present

## 2020-11-26 DIAGNOSIS — N179 Acute kidney failure, unspecified: Secondary | ICD-10-CM | POA: Diagnosis not present

## 2020-11-26 LAB — BASIC METABOLIC PANEL
Anion gap: 8 (ref 5–15)
BUN: 36 mg/dL — ABNORMAL HIGH (ref 6–20)
CO2: 20 mmol/L — ABNORMAL LOW (ref 22–32)
Calcium: 9 mg/dL (ref 8.9–10.3)
Chloride: 102 mmol/L (ref 98–111)
Creatinine, Ser: 1.92 mg/dL — ABNORMAL HIGH (ref 0.61–1.24)
GFR, Estimated: 43 mL/min — ABNORMAL LOW (ref >60)
Glucose, Bld: 329 mg/dL — ABNORMAL HIGH (ref 70–99)
Potassium: 5.1 mmol/L (ref 3.5–5.1)
Sodium: 130 mmol/L — ABNORMAL LOW (ref 135–145)

## 2020-11-26 LAB — GLUCOSE, CAPILLARY
Glucose-Capillary: 249 mg/dL — ABNORMAL HIGH (ref 70–99)
Glucose-Capillary: 254 mg/dL — ABNORMAL HIGH (ref 70–99)

## 2020-11-26 LAB — MAGNESIUM: Magnesium: 1.8 mg/dL (ref 1.7–2.4)

## 2020-11-26 MED ORDER — ENTRESTO 49-51 MG PO TABS: 1.0000 | ORAL_TABLET | Freq: Two times a day (BID) | ORAL | 0 refills | Status: DC

## 2020-11-26 MED ORDER — MAGNESIUM SULFATE 2 GM/50ML IV SOLN
2.0000 g | Freq: Once | INTRAVENOUS | Status: AC
  Administered 2020-11-26: 2 g via INTRAVENOUS
  Filled 2020-11-26: qty 50

## 2020-11-26 MED ORDER — TORSEMIDE 20 MG PO TABS: 20.0000 mg | ORAL_TABLET | Freq: Every day | ORAL | 6 refills | Status: DC

## 2020-11-26 MED ORDER — PREDNISONE 20 MG PO TABS: 40.0000 mg | ORAL_TABLET | Freq: Every day | ORAL | 0 refills | Status: DC

## 2020-11-26 NOTE — Progress Notes (Signed)
Remote ICD transmission.   

## 2020-11-26 NOTE — Discharge Summary (Signed)
Physician Discharge Summary  Jeffrey [name] NBV:[id number] DOB: 1976/07/24 DOA: 11/21/2020  PCP: [name], FNP  Admit date: 11/21/2020 Discharge date: 11/26/2020  Admitted From: Home Disposition: Home  Recommendations for Outpatient Follow-up:  1. Follow-up with cardiology in 1 week 2. Repeat BMP and magnesium level on follow-up 3. Continue prednisone 40 mg for 4 more days  Home Health: None Equipment/Devices: None Discharge Condition: Stable CODE STATUS: Full code Diet recommendation: Low-sodium diet  Brief/Interim Summary: Patient is 45 year old male with past medical history of an ICM with ejection fraction of 20 to 25% status post AICD placement, hypertension, type 2 diabetes mellitus, OSA on CPAP, paroxysmal A. fib on Eliquis, CKD stage III with prior RCC resection, morbid obesity tobacco abuse, marijuana abuse presents to emergency department with lightheadedness and dizziness and hypotension.  Upon arrival to ED: Patient's blood pressure noted to be 81/36 which improved to 96/60 1:05 0.5 L of IV fluid bolus.  Lactic acid: 3.3 and 3.7.  Creatinine: 2.4 up from 1.4 in December.  Patient admitted for further evaluation and management.  Lightheadedness/dizziness: -Likely in the setting of hypotension due to decreased p.o. intake versus polypharmacy and dehydration due to diarrhea and diuretics. -Patient was given IVF 1.5 L bolus in ED -Blood pressure improved.  Lactic acid trended down from 3.7-2.1-1.6 -Orthostatic vitals: Negative -Consult PT recommended no PT follow-up -Discussed with on-call cardiology-we will discharge patient on same dose of Coreg 6.25 mg twice daily & Aldactone 25 mg daily.  Stop BiDil, decrease Entresto dose from 97/103 mg to 49/51 mg and torsemide dose from 40 mg to 20 mg. -Follow-up with Dr. [name] in 1 week and repeat BMP and magnesium level on follow-up visit.  Chest discomfort: Resolved -Troponin flat 26-27. EKG obtained shows normal sinus  rhythm, nonspecific T wave changes (no new changes compared to previous EKG) -Trend troponin. Monitor closely on telemetry. Added nitro as needed for chest pain  Acute gout flare:  -In right great toe.  Hold colchicine and allopurinol in the setting of worsening renal function.  Started prednisone 40 mg daily for 5 days.  AKI on CKD stage IIIb: -Renal function proved this morning -Repeat BMP on follow-up visit  Paroxysmal A. Fib: -Rate controlled.  Continued Coreg and Eliquis and amiodarone  Chronic combined systolic and diastolic CHF/NICM: -Status post AICD placement - Reviewed echo from 9/2 shows ejection fraction of 20 to 25% with grade 1 diastolic dysfunction.  Chest x-ray negative for fluid overload.  BNP: WNL -Strict INO's and daily weight. -On Coreg, torsemide, Aldactone, BiDil and Entresto at home  Type 2 diabetes mellitus: -Uncontrolled with hyperglycemia.  Last A1c: 8.6% on 1/24 -Started on sliding scale insulin and Novolin 20 units twice daily, added NovoLog 4 units 3 times daily with meals.   -Resumed home meds at the time of discharge except Metformin -Follow-up with PCP in 2 weeks.  If BMP shows improvement in kidney function can resume Metformin.  Hypomagnesemia: Magnesium level: 1.8. Replenished.    Diabetic neuropathy: Continued gabapentin  Depression: Continued Wellbutrin and trazodone  Tobacco abuse/marijuana abuse: Counseled about cessation  OSA: CPAP nightly  Morbid obesity with BMI of 53: -Diet modification/exercise and weight loss recommended  Discharge Diagnoses:  Lightheadedness/dizziness in the setting of hypotension due to polypharmacy and decreased p.o. intake Chest discomfort Acute gout flare AKI on CKD stage IIIb Paroxysmal A. fib Chronic combined systolic and diastolic CHF/N ICM status post AICD placement Type 2 diabetes mellitus Hypomagnesemia Diabetic neuropathy Depression Tobacco abuse/marijuana abuse OSA-on  CPAP Morbid  obesity with BMI of 53   Discharge Instructions  Discharge Instructions    Diet - low sodium heart healthy   Complete by: As directed    Discharge instructions   Complete by: As directed    Follow-up with cardiology in 1 week Repeat BMP and magnesium level on follow-up visit Take prednisone 40 mg daily for 4 days   Increase activity slowly   Complete by: As directed      Allergies as of 11/26/2020      Reactions   Aspirin Shortness Of Breath, Itching, Rash    Burning sensation (Patient reports he tolerates other NSAIDS)    Bee Venom Hives, Swelling   Lisinopril Cough   Tomato Rash      Medication List    STOP taking these medications   BiDil 20-37.5 MG tablet Generic drug: isosorbide-hydrALAZINE   Colchicine 0.6 MG Caps Commonly known as: Mitigare   metFORMIN 1000 MG tablet Commonly known as: GLUCOPHAGE   sacubitril-valsartan 97-103 MG Commonly known as: ENTRESTO Replaced by: Entresto 49-51 MG     TAKE these medications   Accu-Chek FastClix Lancets Misc USE AS DIRECTED FOUR TIMES DAILY   Accu-Chek Guide test strip Generic drug: glucose blood USE AS DIRECTED FOUR TIMES DAILY   acetaminophen 325 MG tablet Commonly known as: TYLENOL Take 2 tablets (650 mg total) by mouth every 4 (four) hours as needed for mild pain, moderate pain, fever or headache.   allopurinol 100 MG tablet Commonly known as: ZYLOPRIM Take 1 tablet (100 mg total) by mouth daily.   amiodarone 100 MG tablet Commonly known as: PACERONE Take 1 tablet (100 mg total) by mouth daily.   apixaban 5 MG Tabs tablet Commonly known as: ELIQUIS Take 2 tablets (10 mg total) by mouth 2 (two) times daily for 7 days, THEN 1 tablet (5 mg total) 2 (two) times daily. Start taking on: June 06, 2020 What changed: See the new instructions.   blood glucose meter kit and supplies Dispense based on patient and insurance preference. Use up to four times daily as directed. (FOR ICD-10 E10.9, E11.9).    busPIRone 10 MG tablet Commonly known as: BUSPAR Take 1 tablet (10 mg total) by mouth 2 (two) times daily.   carvedilol 6.25 MG tablet Commonly known as: COREG Take 1 tablet (6.25 mg total) by mouth 2 (two) times daily.   cetirizine 10 MG tablet Commonly known as: ZYRTEC Take 1 tablet (10 mg total) by mouth daily.   empagliflozin 10 MG Tabs tablet Commonly known as: Jardiance Take 1 tablet (10 mg total) by mouth daily before breakfast.   Entresto 49-51 MG Generic drug: sacubitril-valsartan Take 1 tablet by mouth 2 (two) times daily. Replaces: sacubitril-valsartan 97-103 MG   fluticasone 50 MCG/ACT nasal spray Commonly known as: FLONASE Place 2 sprays into both nostrils daily. What changed:   when to take this  reasons to take this   FreeStyle Libre 14 Day Reader Devi 1 each by Does not apply route 3 (three) times daily between meals as needed.   gabapentin 300 MG capsule Commonly known as: NEURONTIN Take 1 capsule (300 mg total) by mouth 3 (three) times daily.   glipiZIDE 10 MG tablet Commonly known as: GLUCOTROL Take 1 tablet (10 mg total) by mouth 2 (two) times daily before a meal.   Guardian Sensor 3 Misc 1 Device by Does not apply route 3 (three) times daily between meals as needed.   insulin NPH-regular Human (70-30) 100 UNIT/ML   injection Inject 38 Units into the skin 2 (two) times daily with a meal.   Magnesium Oxide 200 MG Tabs Take 100 mg by mouth daily.   meclizine 25 MG tablet Commonly known as: ANTIVERT Take 1 tablet (25 mg total) by mouth 3 (three) times daily.   NovoLOG FlexPen 100 UNIT/ML FlexPen Generic drug: insulin aspart INJECT 0-15 UNIT(S) INTO THE SKIN THREE TIMES DAILY WITH MEALS VIA SLIDING SCALE What changed: See the new instructions.   predniSONE 20 MG tablet Commonly known as: DELTASONE Take 2 tablets (40 mg total) by mouth daily with breakfast for 4 days. Start taking on: November 27, 2020   spironolactone 25 MG  tablet Commonly known as: ALDACTONE Take 0.5 tablets (12.5 mg total) by mouth daily.   torsemide 20 MG tablet Commonly known as: DEMADEX Take 1 tablet (20 mg total) by mouth daily. What changed: how much to take   traZODone 100 MG tablet Commonly known as: DESYREL Take 1 tablet (100 mg total) by mouth at bedtime. For sleep   TRUEplus 5-Bevel Pen Needles 31G X 6 MM Misc Generic drug: Insulin Pen Needle 1 Syringe by Does not apply route as needed.   TRUEplus Insulin Syringe 31G X 5/16" 0.5 ML Misc Generic drug: Insulin Syringe-Needle U-100 USE TO INJECT INSULIN TWICE DAILY.   Vitamin D (Ergocalciferol) 1.25 MG (50000 UNIT) Caps capsule Commonly known as: DRISDOL Take 1 capsule (50,000 Units total) by mouth once a week. What changed: when to take this       Follow-up Information    [name], FNP Follow up in 2 week(s).   Specialty: Family Medicine Contact information: [place] [address] [phone]        Bensimhon, Shaune Pascal, MD Follow up in 1 week(s).   Specialty: Cardiology Contact information: [address] [place] [address] [phone]        [name], MD .   Specialty: Cardiology Contact information: [phone] [place] [address] [phone]              Allergies  Allergen Reactions  . Aspirin Shortness Of Breath, Itching and Rash     Burning sensation (Patient reports he tolerates other NSAIDS)   . Bee Venom Hives and Swelling  . Lisinopril Cough  . Tomato Rash    Consultations:  Cardiology   Procedures/Studies: DG Chest Portable 1 View  Result Date: 11/21/2020 CLINICAL DATA:  Hypotension, dizziness EXAM: PORTABLE CHEST 1 VIEW COMPARISON:  Chest x-ray 05/17/2020, CT chest 12/28/2014 FINDINGS: Left chest for cardiac defibrillator in similar position. Cardiomegaly. The heart size and mediastinal contours are unchanged. Streaky airspace opacity at the  right base. No focal consolidation. No pulmonary edema. No pleural effusion. No pneumothorax. No acute osseous abnormality. IMPRESSION: 1. Streaky airspace opacity at the right base that likely represents atelectasis. 2. Stable cardiomegaly. Electronically Signed   By: Iven Finn M.D.   On: 11/21/2020 22:43   CUP PACEART REMOTE DEVICE CHECK  Result Date: 11/20/2020 Scheduled remote reviewed. Normal device function.  Next remote 91 days. Kathy Breach, RN, CCDS, [employer] Configuration: Secondary Gain Setting: 1X Post Shock Pacing: OFF      Subjective: Patient seen and examined.  Except his right great toe pain he is doing well, denies chest pain, shortness of breath, lightheadedness or dizziness.  Wishes to go home today.  Discharge Exam: Vitals:   11/25/20 2232 11/26/20 1103  BP:  124/90  Pulse: 84 75  Resp: 18 17  Temp:  97.7 F (36.5 C)  SpO2: 95% 92%   Vitals:   11/25/20 1724 11/25/20 1955 11/25/20 2232 11/26/20 1103  BP: 113/89 96/64  124/90  Pulse: 73 90 84 75  Resp: 17 18 18 17  Temp: 98.6 F (37 C) 98.1 F (36.7 C)  97.7 F (36.5 C)  TempSrc:  Oral    SpO2: 97% 98% 95% 92%  Weight:      Height:        General: Pt is alert, awake, not in acute distress, on room air, communicating well, obese Cardiovascular: RRR, S1/S2 +, no rubs, no gallops Respiratory: CTA bilaterally, no wheezing, no rhonchi Abdominal: Soft, NT, ND, bowel sounds + Extremities: no edema, no cyanosis    The results of significant diagnostics from this hospitalization (including imaging, microbiology, ancillary and laboratory) are listed below for reference.     Microbiology: Recent Results (from the past 240 hour(s))  Resp Panel by RT-PCR (Flu A&B, Covid) Nasopharyngeal Swab     Status: None   Collection Time: 11/21/20 10:40 PM   Specimen: Nasopharyngeal Swab; Nasopharyngeal(NP) swabs in vial transport medium  Result Value Ref Range Status   SARS Coronavirus 2 by RT  PCR NEGATIVE NEGATIVE Final    Comment: (NOTE) SARS-CoV-2 target nucleic acids are NOT DETECTED.  The SARS-CoV-2 RNA is generally detectable in upper respiratory specimens during the acute phase of infection. The lowest concentration of SARS-CoV-2 viral copies this assay can detect is 138 copies/mL. A negative result does not preclude SARS-Cov-2 infection and should not be used as the sole basis for treatment or other patient management decisions. A negative result may occur with  improper specimen collection/handling, submission of specimen other than nasopharyngeal swab, presence of viral mutation(s) within the areas targeted by this assay, and inadequate number of viral copies(<138 copies/mL). A negative result must be combined with clinical observations, patient history, and epidemiological information. The expected result is Negative.  Fact Sheet for Patients:  https://www.fda.gov/media/152166/download  Fact Sheet for Healthcare Providers:  https://www.fda.gov/media/152162/download  This test is no t yet approved or cleared by the [place] FDA and  has been authorized for detection and/or diagnosis of SARS-CoV-2 by FDA under an Emergency Use Authorization (EUA). This EUA will remain  in effect (meaning this test can be used) for the duration of the COVID-19 declaration under Section 564(b)(1) of the Act, 21 U.S.C.section 360bbb-3(b)(1), unless the authorization is terminated  or revoked sooner.       Influenza A by PCR NEGATIVE NEGATIVE Final   Influenza B by PCR NEGATIVE NEGATIVE Final    Comment: (NOTE) The Xpert Xpress SARS-CoV-2/FLU/RSV plus assay is intended as an aid in the diagnosis of influenza from Nasopharyngeal swab specimens and should not be used as a sole basis for treatment. Nasal washings and aspirates are unacceptable for Xpert Xpress SARS-CoV-2/FLU/RSV testing.  Fact Sheet for Patients: https://www.fda.gov/media/152166/download  Fact Sheet for  Healthcare Providers: https://www.fda.gov/media/152162/download  This test is not yet approved or cleared by the [place] FDA and has been authorized for detection and/or diagnosis of SARS-CoV-2 by FDA under an Emergency Use Authorization (EUA). This EUA will remain in effect (meaning this test can be used) for the duration of the COVID-19 declaration under Section 564(b)(1) of the Act, 21 U.S.C. section 360bbb-3(b)(1), unless the authorization is terminated or revoked.  Performed at [facility], [address] [place], [place] [address]      Labs:   BNP (last 3 results) Recent Labs    11/21/20 2222  BNP 65.0   Basic Metabolic Panel: Recent Labs  Lab 11/22/20 0820 11/23/20 0638 11/24/20 0428 11/25/20 0355 11/26/20 0454  NA 137 138 137 134* 130*  K 4.5 4.4 4.1 4.5 5.1  CL 106 110 109 104 102  CO2 20* 20* 21* 21* 20*  GLUCOSE 240* 165* 170* 269* 329*  BUN 49* 33* 19 30* 36*  CREATININE 2.15* 1.46* 1.25* 2.28* 1.92*  CALCIUM 9.3 9.5 9.2 8.8* 9.0  MG  --  1.7 1.8 1.8 1.8   Liver Function Tests: No results for input(s): AST, ALT, ALKPHOS, BILITOT, PROT, ALBUMIN in the last 168 hours. No results for input(s): LIPASE, AMYLASE in the last 168 hours. No results for input(s): AMMONIA in the last 168 hours. CBC: Recent Labs  Lab 11/21/20 2155 11/22/20 0820  WBC 7.7 6.6  HGB 13.9 13.2  HCT 43.8 40.3  MCV 79.2* 79.2*  PLT 313 284   Cardiac Enzymes: No results for input(s): CKTOTAL, CKMB, CKMBINDEX, TROPONINI in the last 168 hours. BNP: Invalid input(s): POCBNP CBG: Recent Labs  Lab 11/25/20 0656 11/25/20 1159 11/25/20 1653 11/25/20 2221 11/26/20 0745  GLUCAP 222* 268* 174* 471* 249*   D-Dimer No results for input(s): DDIMER in the last 72 hours. Hgb A1c No results for input(s): HGBA1C in the last 72 hours. Lipid Profile No results for input(s): CHOL, HDL, LDLCALC, TRIG, CHOLHDL, LDLDIRECT in the last 72 hours. Thyroid function studies No  results for input(s): TSH, T4TOTAL, T3FREE, THYROIDAB in the last 72 hours.  Invalid input(s): FREET3 Anemia work up No results for input(s): VITAMINB12, FOLATE, FERRITIN, TIBC, IRON, RETICCTPCT in the last 72 hours. Urinalysis    Component Value Date/Time   COLORURINE YELLOW 11/22/2020 0359   APPEARANCEUR CLEAR 11/22/2020 0359   APPEARANCEUR Clear 10/28/2020 1036   LABSPEC 1.010 11/22/2020 0359   PHURINE 5.0 11/22/2020 0359   GLUCOSEU NEGATIVE 11/22/2020 0359   HGBUR NEGATIVE 11/22/2020 0359   BILIRUBINUR NEGATIVE 11/22/2020 0359   BILIRUBINUR Negative 10/28/2020 1036   KETONESUR NEGATIVE 11/22/2020 0359   PROTEINUR 30 (A) 11/22/2020 0359   UROBILINOGEN 0.2 06/24/2020 0929   UROBILINOGEN 0.2 11/01/2017 1128   NITRITE NEGATIVE 11/22/2020 0359   LEUKOCYTESUR NEGATIVE 11/22/2020 0359   Sepsis Labs Invalid input(s): PROCALCITONIN,  WBC,  LACTICIDVEN Microbiology Recent Results (from the past 240 hour(s))  Resp Panel by RT-PCR (Flu A&B, Covid) Nasopharyngeal Swab     Status: None   Collection Time: 11/21/20 10:40 PM   Specimen: Nasopharyngeal Swab; Nasopharyngeal(NP) swabs in vial transport medium  Result Value Ref Range Status   SARS Coronavirus 2 by RT PCR NEGATIVE NEGATIVE Final    Comment: (NOTE) SARS-CoV-2 target nucleic acids are NOT DETECTED.  The SARS-CoV-2 RNA is generally detectable in upper respiratory specimens during the acute phase of infection. The lowest concentration of SARS-CoV-2 viral copies this assay can detect is 138 copies/mL. A negative result does not preclude SARS-Cov-2 infection and should not be used as the sole basis for treatment or other patient management decisions. A negative result may occur with  improper specimen collection/handling, submission of specimen other than nasopharyngeal swab, presence of viral mutation(s) within the areas targeted by this assay, and inadequate number of viral copies(<138 copies/mL). A negative result must be  combined with clinical observations, patient history, and epidemiological information. The expected result is Negative.  Fact Sheet for Patients:  https://www.fda.gov/media/152166/download  Fact Sheet for Healthcare Providers:  https://www.fda.gov/media/152162/download  This test   is no t yet approved or cleared by the [place] FDA and  has been authorized for detection and/or diagnosis of SARS-CoV-2 by FDA under an Emergency Use Authorization (EUA). This EUA will remain  in effect (meaning this test can be used) for the duration of the COVID-19 declaration under Section 564(b)(1) of the Act, 21 U.S.C.section 360bbb-3(b)(1), unless the authorization is terminated  or revoked sooner.       Influenza A by PCR NEGATIVE NEGATIVE Final   Influenza B by PCR NEGATIVE NEGATIVE Final    Comment: (NOTE) The Xpert Xpress SARS-CoV-2/FLU/RSV plus assay is intended as an aid in the diagnosis of influenza from Nasopharyngeal swab specimens and should not be used as a sole basis for treatment. Nasal washings and aspirates are unacceptable for Xpert Xpress SARS-CoV-2/FLU/RSV testing.  Fact Sheet for Patients: https://www.fda.gov/media/152166/download  Fact Sheet for Healthcare Providers: https://www.fda.gov/media/152162/download  This test is not yet approved or cleared by the [place] FDA and has been authorized for detection and/or diagnosis of SARS-CoV-2 by FDA under an Emergency Use Authorization (EUA). This EUA will remain in effect (meaning this test can be used) for the duration of the COVID-19 declaration under Section 564(b)(1) of the Act, 21 U.S.C. section 360bbb-3(b)(1), unless the authorization is terminated or revoked.  Performed at [facility], [address] [place], [place] [address]      Time coordinating discharge: Over 30 minutes  SIGNED:   Rinka R Pahwani, MD  [facility] 11/26/2020, 11:55 AM Pager   If 7PM-7AM, please contact  night-coverage [email or web address]  

## 2020-11-26 NOTE — Progress Notes (Signed)
Jeffrey [name] to be discharged Home per MD order. Discussed prescriptions and follow up appointments with the patient. Prescriptions given to patient; medication list explained in detail. Patient verbalized understanding.  Skin clean, dry and intact without evidence of skin break down, no evidence of skin tears noted. IV catheter discontinued intact. Site without signs and symptoms of complications. Dressing and pressure applied. Pt denies pain at the site currently. No complaints noted.  Patient free of lines, drains, and wounds.   An After Visit Summary (AVS) was printed and given to the patient. Patient escorted via wheelchair, and discharged home via private auto.  Amaryllis Dyke, RN

## 2020-11-26 NOTE — Consult Note (Signed)
   [facility] Inpatient Consult   11/26/2020  DEAARON FULGHUM 03-09-1976 [id number]   Managed Medicaid - High Risk for unplanned readmission score  Patient is assigned to a Chronic Care Manager.  Spoke with the patient via phone [phone], HIPAA verified. Explained to the patient to expect calls from his assigned CCM nurse.  He states he would. States he feels better since being in the hospital. At this time he state he had no concerns but will talk to the nurse when she calls.  For questions, please contact:  Natividad Brood, RN BSN [facility] Liaison  [phone] business mobile phone Toll free office [phone]  Fax number: [phone] [email or web address]

## 2020-11-27 ENCOUNTER — Telehealth: Payer: Self-pay

## 2020-11-27 ENCOUNTER — Other Ambulatory Visit: Payer: Self-pay | Admitting: Family Medicine

## 2020-11-27 MED FILL — TRUEPLUS 5-BEVEL PEN NEEDLE: 31G X 5 MM | 30 days supply | Qty: 100 | Fill #0

## 2020-11-27 MED FILL — predniSONE 20 MG TABS: 20 | 4 days supply | Qty: 8 | Fill #0

## 2020-11-27 NOTE — Telephone Encounter (Signed)
Transition Care Management Follow-up Telephone Call  Date of discharge and from where: 11/26/2020 from [facility]  How have you been since you were released from the hospital? Pt states that he is feeling much better since he has been home and has no questions or concerns at this time.   Any questions or concerns? No  Items Reviewed:  Did the pt receive and understand the discharge instructions provided? Yes   Medications obtained and verified? Yes   Other? No   Any new allergies since your discharge? No   Dietary orders reviewed? Heart Healthy  Do you have support at home? Yes   Functional Questionnaire: (I = Independent and D = Dependent) ADLs: I  Bathing/Dressing- I  Meal Prep- I  Eating- I  Maintaining continence- I  Transferring/Ambulation- I  Managing Meds- I  Follow up appointments reviewed:   PCP Hospital f/u appt confirmed? No    Specialist Hospital f/u appt confirmed? Yes  [facility] CCC-MM Care Manager call at 10:30am.  Are transportation arrangements needed? No   If their condition worsens, is the pt aware to call PCP or go to the Emergency Dept.? Yes  Was the patient provided with contact information for the PCP's office or ED? Yes  Was to pt encouraged to call back with questions or concerns? Yes

## 2020-11-28 ENCOUNTER — Other Ambulatory Visit: Payer: Self-pay | Admitting: Obstetrics and Gynecology

## 2020-11-28 ENCOUNTER — Other Ambulatory Visit: Payer: Self-pay

## 2020-11-28 NOTE — Patient Outreach (Signed)
Medicaid Managed Care   Nurse Care Manager Note  11/28/2020 Name:  Jeffrey [name] MRN:  [id number] DOB:  06/28/1976  Jeffrey [name] is an 45 y.o. year old male who is a primary patient of [name], FNP.  The [facility] Managed Care Coordination team was consulted for assistance with:    chronic healthcare management needs.  [name] was given information about Medicaid Managed Care Coordination team services today. Jeffrey [name] agreed to services and verbal consent obtained.  Engaged with patient by telephone for follow up visit in response to provider referral for case management and/or care coordination services.   Assessments/Interventions:  Review of past medical history, allergies, medications, health status, including review of consultants reports, laboratory and other test data, was performed as part of comprehensive evaluation and provision of chronic care management services.  SDOH (Social Determinants of Health) assessments and interventions performed:   Care Plan  Allergies  Allergen Reactions  . Aspirin Shortness Of Breath, Itching and Rash     Burning sensation (Patient reports he tolerates other NSAIDS)   . Bee Venom Hives and Swelling  . Lisinopril Cough  . Tomato Rash    Medications Reviewed Today    Reviewed by Gayla Medicus, RN (Registered Nurse) on 11/28/20 at 66  Med List Status: <None>  Medication Order Taking? Sig Documenting Provider Last Dose Status Informant  Accu-Chek FastClix Lancets MISC [id number] Yes USE AS DIRECTED FOUR TIMES DAILY [name], FNP Taking Active Self  acetaminophen (TYLENOL) 325 MG tablet [id number] Yes Take 2 tablets (650 mg total) by mouth every 4 (four) hours as needed for mild pain, moderate pain, fever or headache. Shirley Friar, PA-C Taking Active Self           Med Note [place] Crane, MISTY D   Fri Nov 22, 2020  2:01 AM)    allopurinol (ZYLOPRIM) 100 MG tablet [id number] Yes Take 1 tablet (100 mg total) by mouth  daily. [name], FNP Taking Active Self  amiodarone (PACERONE) 100 MG tablet [id number] Yes Take 1 tablet (100 mg total) by mouth daily. Bensimhon, Shaune Pascal, MD Taking Active Self  apixaban (ELIQUIS) 5 MG TABS tablet [id number] Yes Take 2 tablets (10 mg total) by mouth 2 (two) times daily for 7 days, THEN 1 tablet (5 mg total) 2 (two) times daily.  Patient taking differently: 1 tablet (5 mg total) 2 (two) times daily.   Bensimhon, Shaune Pascal, MD Taking Active Self  blood glucose meter kit and supplies [id number] Yes Dispense based on patient and insurance preference. Use up to four times daily as directed. (FOR ICD-10 E10.9, E11.9). [name], FNP Taking Active Self  busPIRone (BUSPAR) 10 MG tablet [id number] Yes Take 1 tablet (10 mg total) by mouth 2 (two) times daily. [name], FNP Taking Active Self  carvedilol (COREG) 6.25 MG tablet [id number] Yes Take 1 tablet (6.25 mg total) by mouth 2 (two) times daily. Bensimhon, Shaune Pascal, MD Taking Active Self  cetirizine (ZYRTEC) 10 MG tablet [id number] Yes Take 1 tablet (10 mg total) by mouth daily. [name], FNP Taking Active Self  Continuous Blood Gluc Receiver (FREESTYLE LIBRE 14 DAY READER) DEVI [id number] Yes 1 each by Does not apply route 3 (three) times daily between meals as needed. [name], FNP Taking Active Self  Continuous Blood Gluc Sensor (GUARDIAN SENSOR 3) MISC [id number] Yes 1 Device by Does not apply route 3 (three) times daily between meals as needed.  [name], FNP Taking Active Self  empagliflozin (JARDIANCE) 10 MG TABS tablet [id number] Yes Take 1 tablet (10 mg total) by mouth daily before breakfast. Bensimhon, Shaune Pascal, MD Taking Active Self  fluticasone (FLONASE) 50 MCG/ACT nasal spray [id number] Yes Place 2 sprays into both nostrils daily.  Patient taking differently: Place 2 sprays into both nostrils daily as needed for allergies.   [name], FNP Taking Active            Med Note  [name]   Mon Oct 28, 2020  9:13 AM) As needed  gabapentin (NEURONTIN) 300 MG capsule [id number] Yes Take 1 capsule (300 mg total) by mouth 3 (three) times daily. [name], FNP Taking Active Self  glipiZIDE (GLUCOTROL) 10 MG tablet [id number] Yes Take 1 tablet (10 mg total) by mouth 2 (two) times daily before a meal. [name], FNP Taking Active Self  glucose blood (ACCU-CHEK GUIDE) test strip [id number] Yes USE AS DIRECTED FOUR TIMES DAILY [name], FNP Taking Active Self  insulin NPH-regular Human (70-30) 100 UNIT/ML injection [id number] Yes Inject 38 Units into the skin 2 (two) times daily with a meal. [name], FNP Taking Active Self  Magnesium Oxide 200 MG TABS [id number] Yes Take 100 mg by mouth daily. [provider] Taking Active Self  meclizine (ANTIVERT) 25 MG tablet [id number] Yes Take 1 tablet (25 mg total) by mouth 3 (three) times daily. [name], FNP Taking Active Self  NOVOLOG FLEXPEN 100 UNIT/ML FlexPen [id number] Yes INJECT 0-15 UNIT(S) INTO THE SKIN THREE TIMES DAILY WITH MEALS VIA SLIDING SCALE  Patient taking differently: Inject 0-15 Units into the skin See admin instructions. Tid with meals per sliding scale   [name], FNP Taking Active   predniSONE (DELTASONE) 20 MG tablet [id number] Yes Take 2 tablets (40 mg total) by mouth daily with breakfast for 4 days. Pahwani, Michell Heinrich, MD Taking Active   sacubitril-valsartan (ENTRESTO) 49-51 MG [id number] Yes Take 1 tablet by mouth 2 (two) times daily. Pahwani, Michell Heinrich, MD Taking Active   spironolactone (ALDACTONE) 25 MG tablet [id number] Yes Take 0.5 tablets (12.5 mg total) by mouth daily. [name], FNP Taking Active Self  torsemide (DEMADEX) 20 MG tablet [id number] Yes Take 1 tablet (20 mg total) by mouth daily. Mckinley Jewel, MD Taking Active   traZODone (DESYREL) 100 MG tablet [id number] Yes Take 1 tablet (100 mg total) by mouth at bedtime. For sleep [name], FNP Taking Active Self  TRUEPLUS 5-BEVEL PEN NEEDLES 31G X 5 MM MISC [id number] Yes USE AS DIRECTED [name], FNP Taking Active   TRUEPLUS INSULIN SYRINGE 31G X 5/16" 0.5 ML MISC [id number] Yes USE TO INJECT INSULIN TWICE DAILY. [name], FNP Taking Active Self  Vitamin D, Ergocalciferol, (DRISDOL) 1.25 MG (50000 UNIT) CAPS capsule [id number] Yes Take 1 capsule (50,000 Units total) by mouth once a week.  Patient taking differently: Take 50,000 Units by mouth every Monday.   [name], FNP Taking Active Self          Patient Active Problem List   Diagnosis Date Noted  . Lactic acidosis 11/22/2020  . Hypotension 11/22/2020  . Aspiration into airway   . ICD (implantable cardioverter-defibrillator) battery depletion 05/15/2020  . Endotracheal tube present   . Respiratory failure ([place])   . Acute encephalopathy   . Acute pulmonary edema (HCC)   . NICM (nonischemic cardiomyopathy) ([place]) 04/24/2020  .  S/P vasectomy 12/20/2019  . Anxiety 12/20/2019  . Hemoglobin A1C between 7% and 9% indicating borderline diabetic control 12/20/2019  . Seasonal allergies 12/20/2019  . Insomnia 12/20/2019  . Vasectomy evaluation 06/20/2019  . Visual problems 06/20/2019  . Blurry vision, bilateral 06/20/2019  . Hyperglycemia 02/13/2019  . Hyperosmolar (nonketotic) coma ([place]) 01/19/2019  . AICD (automatic cardioverter/defibrillator) present 01/19/2019  . Hyperlipidemia 12/07/2018  . Follow-up exam 11/22/2018  . Class 3 severe obesity due to excess calories with serious comorbidity and body mass index (BMI) of 45.0 to 49.9 in adult ([place]) 11/22/2018  . Dizziness 11/22/2018  . Lingular pneumonia 08/04/2018  . Ventricular tachycardia ([place]) 12/30/2017  . PAF (paroxysmal atrial fibrillation) ([place])   . Dilated cardiomyopathy ([place])   . Pollen allergy 02/17/2017  . Dyslipidemia 02/17/2017  . Hematochezia 11/08/2015  . CKD (chronic kidney disease), stage III ([place]) 06/20/2015   . Cardiac defibrillator in situ   . Chronic systolic CHF (congestive heart failure) ([place]) 03/11/2015  . Gouty arthritis 03/11/2015  . Chest pain 12/28/2014  . AKI (acute kidney injury) ([place]) 04/08/2014  . Aspirin allergy 04/08/2014  . Renal cell carcinoma ([place])   . Gout attack 11/10/2011  . Morbid obesity ([place]) 10/23/2011  . OSA (obstructive sleep apnea) 10/23/2011  . Type 2 diabetes mellitus with stage 3 chronic kidney disease, with long-term current use of insulin ([place]) 10/22/2011  . Hyperlipidemia associated with type 2 diabetes mellitus ([place]) 12/25/2010  . Hypertension associated with diabetes ([place]) 12/25/2010    Conditions to be addressed/monitored per PCP order:  chronic healthcare management needs, CHF, HTN, CKD, OSA,DM2, h/o remal cell carcinoma  Care Plan : General Plan of Care (Adult)  Updates made by Gayla Medicus, RN since 11/28/2020 12:00 AM    Problem: Health Promotion or Disease Self-Management (General Plan of Care)   Priority: High  Onset Date: 09/24/2020  Note:   Current Barriers:  . Chronic Disease Management support and education needs . Update 11/28/20:  Patient recently discharged from hospital 11/26/20-follow up appointments scheduled.  Nurse Case Manager Clinical Goal(s):  [name] Over the next 90 days, patient will attend all scheduled medical appointments:   Interventions:  . Inter-disciplinary care team collaboration (see longitudinal plan of care) . Evaluation of current treatment plan and patient's adherence to plan as established by provider.  [name] Update 11/28/20:  Continue monitoring BP daily and reporting readings outside of parameters. . Reviewed medications with patient. . Discussed plans with patient for ongoing care management follow up and provided patient with direct contact information for care management team . Provided patient with educational materials related to exercise. . Reviewed scheduled/upcoming provider appointments. [name] Update 11/28/20:   Discussed low glycemic vs high glycemic foods.  Pharmacy referral made for review of medications.  Patient Goals/Self-Care Activities Over the next 90 days, patient will:  -Attends all scheduled provider appointments Calls provider office for new concerns or questions  Follow Up Plan: The Managed Medicaid care management team will reach out to the patient again over the next 30 days.  The patient has been provided with contact information for the Managed Medicaid care management team and has been advised to call with any health related questions or concerns.     Follow Up:  Patient agrees to Care Plan and Follow-up.  Plan: The Managed Medicaid care management team will reach out to the patient again over the next 30 days. and The patient has been provided with contact information for the Managed Medicaid care management team and  has been advised to call with any health related questions or concerns.  Date/time of next scheduled RN care management/care coordination outreach:  12/26/20 at 0900.

## 2020-11-28 NOTE — Patient Instructions (Signed)
Hi Mr. Fidalgo, thank you for speaking with me today.  Mr. Lundberg was given information about Medicaid Managed Care team care coordination services as a part of their Healthy [facility] Medicaid benefit. [name] verbally consented to engagement with the [facility] Managed Care team.   For questions related to your Healthy [facility] health plan, please call: [phone] or visit the homepage here: [email or web address]  If you would like to schedule transportation through your Healthy [facility] plan, please call the following number at least 2 days in advance of your appointment: [phone]  Mr. Keats - following are the goals we discussed in your visit today:  Goals Addressed            This Visit's Progress   . Protect My Health       Timeframe:  Long-Range Goal Priority:  High Start Date:    09/24/20                         Expected End Date:     12/23/20                  - schedule recommended health tests  - schedule and keep appointment for annual check-up  Update 11/28/20:Patient recently discharged from hospital 11/26/20-follow up appointments scheduled.       Patient verbalizes understanding of instructions provided today.   The Managed Medicaid care management team will reach out to the patient again over the next 30 days.  The patient has been provided with contact information for the Managed Medicaid care management team and has been advised to call with any health related questions or concerns.   [name] RN, BSN [place] Management Coordinator - Managed Medicaid High Risk [phone]  Following is a copy of your plan of care:  Patient Care Plan: General Plan of Care (Adult)    Problem Identified: Health Promotion or Disease Self-Management (General Plan of Care)   Priority: High  Onset Date: 09/24/2020  Note:   Current Barriers:  . Chronic Disease Management support and education  needs . Update 11/28/20:  Patient recently discharged from hospital 11/26/20-follow up appointments scheduled.  Nurse Case Manager Clinical Goal(s):  [name] Over the next 90 days, patient will attend all scheduled medical appointments:   Interventions:  . Inter-disciplinary care team collaboration (see longitudinal plan of care) . Evaluation of current treatment plan and patient's adherence to plan as established by provider.  [name] Update 11/28/20:  Continue monitoring BP daily and reporting readings outside of parameters. . Reviewed medications with patient. . Discussed plans with patient for ongoing care management follow up and provided patient with direct contact information for care management team . Provided patient with educational materials related to exercise. . Reviewed scheduled/upcoming provider appointments. [name] Update 11/28/20:  Discussed low glycemic vs high glycemic foods.  Pharmacy referral made for review of medications.  Patient Goals/Self-Care Activities Over the next 90 days, patient will:  -Attends all scheduled provider appointments Calls provider office for new concerns or questions  Follow Up Plan: The Managed Medicaid care management team will reach out to the patient again over the next 30 days.  The patient has been provided with contact information for the Managed Medicaid care management team and has been advised to call with any health related questions or concerns.

## 2020-11-29 ENCOUNTER — Telehealth: Payer: Self-pay | Admitting: Family Medicine

## 2020-11-29 NOTE — Telephone Encounter (Signed)
Attempted to reach Jeffrey [name] today to get him scheduled for a phone visit with the Managed Medicaid Pharmacist. I left my name and number for him to call me back. I will reach out again in the next 7-14 days if I have not heard back from him.

## 2020-12-03 MED FILL — ACCU-CHEK GUIDE TEST STRIP: 25 days supply | Qty: 100 | Fill #1

## 2020-12-03 MED FILL — ELIQUIS 5 MG TABLET: 5 | 30 days supply | Qty: 60 | Fill #1

## 2020-12-03 MED FILL — busPIRone HCL 10 MG TABS: 10 | 30 days supply | Qty: 60 | Fill #3

## 2020-12-03 MED FILL — ACCU-CHEK FASTCLIX LANCETS: 25 days supply | Qty: 102 | Fill #1

## 2020-12-03 MED FILL — AMIODARONE HCL 100 MG TABS: 100 | 30 days supply | Qty: 30 | Fill #3

## 2020-12-05 NOTE — Progress Notes (Signed)
Advanced Heart Failure Clinic Note   Primary Care Provider:  [name], FNP Primary Cardiologist:  Dr. [name] Primary HF Cardiologist: Dr. [name]  History: Jeffrey [name] is a 45 y.o. male with a history of  poorly controlled HTN, R renal cell carcinoma s/p nephrectomy, CKD IIIa, DM2, OSA, gout, morbid obesity and systolic HF due to NICM.   Echo in 2012 with EF 20-25%.  In 1/13 had nuclear study EF 45-50% suggestive of NICM.  Admitted 3/16 secondary to atypical chest pain, dyspnea, and mild troponin elevation. Echo with "severely reduced LV function" - EF not quantified due to poor windows. Cath 8/16 showed normal coronaries and moderately elevated filling pressures with moderately depressed cardiac output. [name]- ICD implanted.  Admitted 9/18 with ADHF in setting of new afib. Started on amiodarone. Pt diuresed and converted to NSR without requiring DCCV. Started on Eliquis for [facility].  Admitted 3/19 with ICD shock. Electrolytes stable. Echo 12/31/17 with LVEF 25-30%.   Pt admitted 05/15/20 for S-ICD generator change. Course was complicated with acute hypoxic/hypercapnic respiratory failure due to possible flash pulmonary edema. Had traumatic intubation resulting in aspiration and requiring ventilator support and short term pressor support.  Hospitalized 2/17-2/22/22 for hypotension  & dizziness. Found to have AKI, likely 2/2 to recent diarrhea, diuretic use and poor po intake. Gently hydrated with IVF, Entresto and torsemide decreased, and BiDil stopped. Discharge weight 323 lbs.  Today he returns for HF follow up. Last visit (12/21) with NYHA II symptoms, feeling good, and SGLT2i started. Had recent hospitalization see above. Now, overall feeling fine. Denies increasing SOB, CP, dizziness, edema, or PND/Orthopnea. SOB with walking longer distances at an incline, does well with stairs. Appetite ok. No fever or chills. Does not weigh at home. Smokes THC. Has not  started Jardiance and is taking Entresto 97/103 bid instead of 49/51 at discharge.  Cardiac Studies: Echo (9/21): EF 20-25%, severe LV dysfunction, Grade I DD, RV ok. Echo (3/19): EF 25-30%, Mod MR, mild RV dilation, Mod TR, RVSP 67 mm Hg.  Echo (4/17): EF 20-25%  Grade III DD Echo (8/16). EF 20-25%, grade II diastolic dysfunction normal RV size with mild HK    L/RHC (8/16): RA = 7 RV = 42/3/7 PA = 43/30 (39) PCW = 26 Fick cardiac output/index = 4.9/2.1 PVR = 2.7 WU SVR = 1651 FA sat = 95% PA sat = 65%, 65% Ao = 122/97 (109) LV = 110/31/33  1) Normal coronaries 2) Moderately elevated filling pressures with moderately depressed cardiac output  Review of systems complete and found to be negative unless listed in HPI.    Past Medical History:  Diagnosis Date  . Anxiety   . Aspirin allergy   . Childhood asthma   . Chronic systolic CHF (congestive heart failure) ([place])    a. EF 20-25% in 2012. b. EF 45-50% in 10/2011 with nonischemic nuc - presumed NICM. c. 12/2014 Echo: Sev depressed LV fxn, sev dil LV, mild LVH, mild MR, sev dil LA, mildly reduced RV fxn.  . CKD (chronic kidney disease) stage 2, GFR 60-89 ml/min   . H/O vasectomy 12/2019  . High cholesterol   . Hypertension   . Morbid obesity ([place])   . Nephrolithiasis   . OSA on CPAP   . Presumed NICM    a. 04/2014 Myoview: EF 26%, glob HK, sev glob HK, ? prior infarct;  b. Never cathed 2/2 CKD.  [name] Renal cell carcinoma ([place])  a. s/p Rt robotic assisted partial converted to radical nephrectomy on 01/2013.  . Troponin level elevated    a. 04/2014, 12/2014: felt due to CHF.  . Type II diabetes mellitus ([place])   . Ventricular tachycardia ([place])    a. appropriate ICD therapy 12/2017   Past Surgical History:  Procedure Laterality Date  . APPENDECTOMY  07/2004  . CARDIAC CATHETERIZATION N/A 05/17/2015   Procedure: Right/Left Heart Cath and Coronary Angiography;  Surgeon: Jolaine Artist, MD;  Location: [place] [facility];   Service: Cardiovascular;  Laterality: N/A;  . EP IMPLANTABLE DEVICE N/A 05/17/2015   Procedure: SubQ ICD Implant;  Surgeon: Will Meredith Leeds, MD;  Location: [place] [facility];  Service: Cardiovascular;  Laterality: N/A;  . ROBOTIC ASSISTED LAPAROSCOPIC LYSIS OF ADHESION  01/13/2013   Procedure: ROBOTIC ASSISTED LAPAROSCOPIC LYSIS OF ADHESION EXTENSIVE;  Surgeon: Alexis Frock, MD;  Location: [facility];  Service: Urology;;  . ROBOTIC ASSITED PARTIAL NEPHRECTOMY Right 01/13/2013   Procedure: ROBOTIC ASSITED PARTIAL NEPHRECTOMY CONVERTED TO ROBOTIC ASSISTED RIGHT RADICAL NEPHRECTOMY;  Surgeon: Alexis Frock, MD;  Location: [facility];  Service: Urology;  Laterality: Right;  . SUBQ ICD CHANGEOUT N/A 05/16/2020   Procedure: SUBQ ICD CHANGEOUT;  Surgeon: [name], MD;  Location: [place] [facility];  Service: Cardiovascular;  Laterality: N/A;  . SUBQ ICD CHANGEOUT N/A 05/15/2020   Procedure: LFYB ICD CHANGEOUT;  Surgeon: [name], MD;  Location: [place] [facility];  Service: Cardiovascular;  Laterality: N/A;  . VASECTOMY     Allergies  Allergies  Allergen Reactions  . Aspirin Shortness Of Breath, Itching and Rash     Burning sensation (Patient reports he tolerates other NSAIDS)   . Bee Venom Hives and Swelling  . Lisinopril Cough  . Tomato Rash   Home Medications Current Outpatient Medications  Medication Sig Dispense Refill  . Accu-Chek FastClix Lancets MISC USE AS DIRECTED FOUR TIMES DAILY 102 each 11  . acetaminophen (TYLENOL) 325 MG tablet Take 2 tablets (650 mg total) by mouth every 4 (four) hours as needed for mild pain, moderate pain, fever or headache.    . allopurinol (ZYLOPRIM) 100 MG tablet Take 1 tablet (100 mg total) by mouth daily. 90 tablet 3  . amiodarone (PACERONE) 100 MG tablet Take 1 tablet (100 mg total) by mouth daily. 30 tablet 11  . apixaban (ELIQUIS) 5 MG TABS tablet Take 2 tablets (10 mg total) by mouth 2 (two) times daily for 7 days, THEN 1 tablet (5 mg  total) 2 (two) times daily. (Patient taking differently: 1 tablet (5 mg total) 2 (two) times daily.) 75 tablet 10  . blood glucose meter kit and supplies Dispense based on patient and insurance preference. Use up to four times daily as directed. (FOR ICD-10 E10.9, E11.9). 1 each 0  . busPIRone (BUSPAR) 10 MG tablet Take 1 tablet (10 mg total) by mouth 2 (two) times daily. 60 tablet 11  . carvedilol (COREG) 6.25 MG tablet Take 1 tablet (6.25 mg total) by mouth 2 (two) times daily. 30 tablet 11  . cetirizine (ZYRTEC) 10 MG tablet Take 1 tablet (10 mg total) by mouth daily. 90 tablet 3  . Continuous Blood Gluc Receiver (FREESTYLE LIBRE 14 DAY READER) DEVI 1 each by Does not apply route 3 (three) times daily between meals as needed. 1 each 1  . Continuous Blood Gluc Sensor (GUARDIAN SENSOR 3) MISC 1 Device by Does not apply route 3 (three) times daily between meals as  needed. 5 each 6  . empagliflozin (JARDIANCE) 10 MG TABS tablet Take 1 tablet (10 mg total) by mouth daily before breakfast. 90 tablet 3  . fluticasone (FLONASE) 50 MCG/ACT nasal spray Place 2 sprays into both nostrils daily. (Patient taking differently: Place 2 sprays into both nostrils daily as needed for allergies.) 16 g 6  . gabapentin (NEURONTIN) 300 MG capsule Take 1 capsule (300 mg total) by mouth 3 (three) times daily. 270 capsule 3  . glipiZIDE (GLUCOTROL) 10 MG tablet Take 1 tablet (10 mg total) by mouth 2 (two) times daily before a meal. 180 tablet 3  . glucose blood (ACCU-CHEK GUIDE) test strip USE AS DIRECTED FOUR TIMES DAILY 100 strip 11  . insulin NPH-regular Human (70-30) 100 UNIT/ML injection Inject 38 Units into the skin 2 (two) times daily with a meal. 60 mL 12  . Magnesium Oxide 200 MG TABS Take 100 mg by mouth daily.    [name] NOVOLOG FLEXPEN 100 UNIT/ML FlexPen INJECT 0-15 UNIT(S) INTO THE SKIN THREE TIMES DAILY WITH MEALS VIA SLIDING SCALE (Patient taking differently: Inject 0-15 Units into the skin See admin instructions.  Tid with meals per sliding scale) 15 mL 12  . sacubitril-valsartan (ENTRESTO) 24-26 MG Take 1 tablet by mouth 2 (two) times daily.    [name] spironolactone (ALDACTONE) 25 MG tablet Take 0.5 tablets (12.5 mg total) by mouth daily. 45 tablet 6  . torsemide (DEMADEX) 20 MG tablet Take 1 tablet (20 mg total) by mouth daily. 60 tablet 6  . traZODone (DESYREL) 100 MG tablet Take 1 tablet (100 mg total) by mouth at bedtime. For sleep 90 tablet 3  . TRUEPLUS 5-BEVEL PEN NEEDLES 31G X 5 MM MISC USE AS DIRECTED 100 each 12  . TRUEPLUS INSULIN SYRINGE 31G X 5/16" 0.5 ML MISC USE TO INJECT INSULIN TWICE DAILY. 100 each 11  . Vitamin D, Ergocalciferol, (DRISDOL) 1.25 MG (50000 UNIT) CAPS capsule Take 1 capsule (50,000 Units total) by mouth once a week. (Patient taking differently: Take 50,000 Units by mouth every Monday.) 5 capsule 2   No current facility-administered medications for this encounter.   Vitals:   12/06/20 1209  BP: 121/80  Pulse: 91  SpO2: 98%  Weight: (!) 152.8 kg (336 lb 12.8 oz)   Wt Readings from Last 3 Encounters:  12/06/20 (!) 152.8 kg (336 lb 12.8 oz)  11/22/20 (!) 146.9 kg (323 lb 13.7 oz)  09/16/20 (!) 149.2 kg (329 lb)   Physical Exam General:  NAD. No resp difficulty HEENT: Normal Neck: Supple. Thick neck, difficult to assess JVD. Carotids 2+ bilat; no bruits. No lymphadenopathy or thryomegaly appreciated. Cor: PMI nondisplaced. Regular rate & rhythm. No rubs, gallops or murmurs. Lungs: Clear Abdomen: Obese, soft, nontender, nondistended. No hepatosplenomegaly. No bruits or masses. Good bowel sounds. Extremities: No cyanosis, clubbing, rash, edema Neuro: Alert & oriented x 3, cranial nerves grossly intact. Moves all 4 extremities w/o difficulty. Affect pleasant.  ECG: SR 88 bpm (personally reviewed).  Assessment & Plan 1.  Chronic systolic congestive heart failure: s/p [employer] subcutaneous ICD implant 05/17/15 (gen change 8/21 - c/b traumatic intubation).   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  - Echo 12/31/17 LVEF 25-30%, Mod MR. RVSP 67 - Echo 9/21: EF 20-25% Personally reviewed - NYHA II. Volume status ok. - Start Jardiance 10 mg daily. - Continue torsemide 20 mg daily.  - Continue Entresto 97/103 mg bid. - Continue carvedilol 6.25 mg bid.  - Continue spiro 12.5 daily.  - Off  Bidil with recent hypotension. - BMET & Mag today.  2. Paroxysmal Afib - New diagnosis 06/2017.  - Maintaining NSR today. - Continue Eliquis 5 mg bid.  No bleeding issues. - Continue amio 100 daily.  - TFTs/LFTs ok (12/21).  3. VT with ICD shock 12/29/17 - on amio 100 mg daily.  - No recent VT.  4. Hypertension  - Blood pressure well controlled.  - Continue current regimen.  5. CKD stage IIIa in setting of solitary kidney (renal cell carcimona 2014) - Creatinine 1.5-1.7.  - Follows at [facility]. - BMET today.  6. OSA:  - Back on CPAP.   7. Marijuana use: - Former tobacco smoker.  - Now smokes THC daily.   8. DM2:  - Stable. Per PCP. Recent HgBa1c ~6%  9. Morbid Obesity - Body mass index is 52.75 kg/m.  - Encouraged increased physical activity and portion control.  Follow back in 4 weeks to further medication titration. Consider adding back BiDil or increasing spiro.  [place], FNP-BC  12/06/20

## 2020-12-06 ENCOUNTER — Telehealth (HOSPITAL_COMMUNITY): Payer: Self-pay

## 2020-12-06 ENCOUNTER — Other Ambulatory Visit: Payer: Self-pay

## 2020-12-06 ENCOUNTER — Other Ambulatory Visit (HOSPITAL_COMMUNITY): Payer: Self-pay | Admitting: Family Medicine

## 2020-12-06 ENCOUNTER — Encounter (HOSPITAL_COMMUNITY): Payer: Self-pay

## 2020-12-06 ENCOUNTER — Ambulatory Visit (HOSPITAL_COMMUNITY)
Admit: 2020-12-06 | Discharge: 2020-12-06 | Disposition: A | Discharge status: Home / Self Care | Payer: Medicaid Other | Source: Ambulatory Visit | Attending: Family Medicine | Admitting: Family Medicine

## 2020-12-06 VITALS — BP 121/80 | HR 91 | Wt 336.8 lb

## 2020-12-06 DIAGNOSIS — E1122 Type 2 diabetes mellitus with diabetic chronic kidney disease: Secondary | ICD-10-CM | POA: Insufficient documentation

## 2020-12-06 DIAGNOSIS — Z888 Allergy status to other drugs, medicaments and biological substances status: Secondary | ICD-10-CM | POA: Insufficient documentation

## 2020-12-06 DIAGNOSIS — Z87891 Personal history of nicotine dependence: Secondary | ICD-10-CM | POA: Insufficient documentation

## 2020-12-06 DIAGNOSIS — I130 Hypertensive heart and chronic kidney disease with heart failure and stage 1 through stage 4 chronic kidney disease, or unspecified chronic kidney disease: Secondary | ICD-10-CM | POA: Diagnosis not present

## 2020-12-06 DIAGNOSIS — I480 Paroxysmal atrial fibrillation: Secondary | ICD-10-CM | POA: Insufficient documentation

## 2020-12-06 DIAGNOSIS — Z9049 Acquired absence of other specified parts of digestive tract: Secondary | ICD-10-CM | POA: Diagnosis not present

## 2020-12-06 DIAGNOSIS — Z713 Dietary counseling and surveillance: Secondary | ICD-10-CM | POA: Insufficient documentation

## 2020-12-06 DIAGNOSIS — Z6841 Body Mass Index (BMI) 40.0 and over, adult: Secondary | ICD-10-CM | POA: Diagnosis not present

## 2020-12-06 DIAGNOSIS — G4733 Obstructive sleep apnea (adult) (pediatric): Secondary | ICD-10-CM | POA: Diagnosis not present

## 2020-12-06 DIAGNOSIS — Z95810 Presence of automatic (implantable) cardiac defibrillator: Secondary | ICD-10-CM | POA: Insufficient documentation

## 2020-12-06 DIAGNOSIS — Z79899 Other long term (current) drug therapy: Secondary | ICD-10-CM | POA: Insufficient documentation

## 2020-12-06 DIAGNOSIS — N1831 Chronic kidney disease, stage 3a: Secondary | ICD-10-CM | POA: Diagnosis not present

## 2020-12-06 DIAGNOSIS — I472 Ventricular tachycardia: Secondary | ICD-10-CM | POA: Diagnosis not present

## 2020-12-06 DIAGNOSIS — I10 Essential (primary) hypertension: Secondary | ICD-10-CM

## 2020-12-06 DIAGNOSIS — I428 Other cardiomyopathies: Secondary | ICD-10-CM | POA: Insufficient documentation

## 2020-12-06 DIAGNOSIS — Z886 Allergy status to analgesic agent status: Secondary | ICD-10-CM | POA: Diagnosis not present

## 2020-12-06 DIAGNOSIS — I5022 Chronic systolic (congestive) heart failure: Secondary | ICD-10-CM | POA: Insufficient documentation

## 2020-12-06 DIAGNOSIS — Z8679 Personal history of other diseases of the circulatory system: Secondary | ICD-10-CM | POA: Diagnosis not present

## 2020-12-06 DIAGNOSIS — Z794 Long term (current) use of insulin: Secondary | ICD-10-CM | POA: Diagnosis not present

## 2020-12-06 DIAGNOSIS — F1290 Cannabis use, unspecified, uncomplicated: Secondary | ICD-10-CM | POA: Insufficient documentation

## 2020-12-06 DIAGNOSIS — Z7901 Long term (current) use of anticoagulants: Secondary | ICD-10-CM | POA: Diagnosis not present

## 2020-12-06 LAB — BASIC METABOLIC PANEL
Anion gap: 10 (ref 5–15)
BUN: 19 mg/dL (ref 6–20)
CO2: 20 mmol/L — ABNORMAL LOW (ref 22–32)
Calcium: 9.4 mg/dL (ref 8.9–10.3)
Chloride: 107 mmol/L (ref 98–111)
Creatinine, Ser: 1.23 mg/dL (ref 0.61–1.24)
GFR, Estimated: >60 mL/min (ref >60)
Glucose, Bld: 239 mg/dL — ABNORMAL HIGH (ref 70–99)
Potassium: 4.4 mmol/L (ref 3.5–5.1)
Sodium: 137 mmol/L (ref 135–145)

## 2020-12-06 LAB — MAGNESIUM: Magnesium: 1.8 mg/dL (ref 1.7–2.4)

## 2020-12-06 MED ORDER — EMPAGLIFLOZIN 10 MG PO TABS: 10.0000 mg | ORAL_TABLET | Freq: Every day | ORAL | 3 refills | Status: DC

## 2020-12-06 MED FILL — JARDIANCE 10 MG TABLET: 10 | 90 days supply | Qty: 90 | Fill #0

## 2020-12-06 NOTE — Telephone Encounter (Signed)
Malena Edman, RN  12/06/2020 2:52 PM EST Back to Top     Patient advised and verbalized understanding. Patient states he will follow up with PCP

## 2020-12-06 NOTE — Telephone Encounter (Signed)
-----   Message from Rafael Bihari, [place] sent at 12/06/2020  2:44 PM EST ----- Labs stable, no change. Blood sugar elevated, needs to follow up with PCP.

## 2020-12-06 NOTE — Patient Instructions (Signed)
START Jardiacne 10 mg, one tab daily  Labs today We will only contact you if something comes back abnormal or we need to make some changes. Otherwise no news is good news!  Labs needed in 10 days  Your physician recommends that you schedule a follow-up appointment in: 4 weeks   Do the following things EVERYDAY: 1) Weigh yourself in the morning before breakfast. Write it down and keep it in a log. 2) Take your medicines as prescribed 3) Eat low salt foods-Limit salt (sodium) to 2000 mg per day.  4) Stay as active as you can everyday 5) Limit all fluids for the day to less than 2 liters  At the [facility], you and your health needs are our priority. As part of our continuing mission to provide you with exceptional heart care, we have created designated Provider Care Teams. These Care Teams include your primary Cardiologist (physician) and Advanced Practice Providers (APPs- Physician Assistants and Nurse Practitioners) who all work together to provide you with the care you need, when you need it.   You may see any of the following providers on your designated Care Team at your next follow up: [name] Dr Glori Bickers . Dr Loralie Champagne . Dr Vickki Muff . Darrick Grinder, NP . Lyda Jester, [place] . Audry Riles, PharmD   Please be sure to bring in all your medications bottles to every appointment.   If you have any questions or concerns before your next appointment please send [place] a message through [place] or call our office at [phone].    TO LEAVE A MESSAGE FOR THE NURSE SELECT OPTION 2, PLEASE LEAVE A MESSAGE INCLUDING: . YOUR NAME . DATE OF BIRTH . CALL BACK NUMBER . REASON FOR CALL**this is important as we prioritize the call backs  YOU WILL RECEIVE A CALL BACK THE SAME DAY AS LONG AS YOU CALL BEFORE 4:00 PM  Please see our updated No Show and Same Day Appointment Cancellation Policy attached to your AVS.

## 2020-12-16 ENCOUNTER — Other Ambulatory Visit (HOSPITAL_COMMUNITY): Payer: Medicaid Other

## 2020-12-18 ENCOUNTER — Ambulatory Visit (HOSPITAL_COMMUNITY)
Admission: RE | Admit: 2020-12-18 | Discharge: 2020-12-18 | Disposition: A | Discharge status: Home / Self Care | Payer: Medicaid Other | Source: Ambulatory Visit | Attending: Internal Medicine | Admitting: Internal Medicine

## 2020-12-18 ENCOUNTER — Other Ambulatory Visit: Payer: Self-pay

## 2020-12-18 DIAGNOSIS — I5022 Chronic systolic (congestive) heart failure: Secondary | ICD-10-CM | POA: Insufficient documentation

## 2020-12-18 LAB — BASIC METABOLIC PANEL
Anion gap: 9 (ref 5–15)
BUN: 26 mg/dL — ABNORMAL HIGH (ref 6–20)
CO2: 21 mmol/L — ABNORMAL LOW (ref 22–32)
Calcium: 9.7 mg/dL (ref 8.9–10.3)
Chloride: 105 mmol/L (ref 98–111)
Creatinine, Ser: 1.57 mg/dL — ABNORMAL HIGH (ref 0.61–1.24)
GFR, Estimated: 55 mL/min — ABNORMAL LOW (ref >60)
Glucose, Bld: 276 mg/dL — ABNORMAL HIGH (ref 70–99)
Potassium: 4.3 mmol/L (ref 3.5–5.1)
Sodium: 135 mmol/L (ref 135–145)

## 2020-12-25 ENCOUNTER — Telehealth: Payer: Self-pay

## 2020-12-25 ENCOUNTER — Other Ambulatory Visit (INDEPENDENT_AMBULATORY_CARE_PROVIDER_SITE_OTHER): Payer: Medicaid Other

## 2020-12-25 ENCOUNTER — Other Ambulatory Visit: Payer: Self-pay

## 2020-12-25 ENCOUNTER — Other Ambulatory Visit: Payer: Self-pay | Admitting: Internal Medicine

## 2020-12-25 ENCOUNTER — Encounter: Payer: Self-pay | Admitting: Internal Medicine

## 2020-12-25 ENCOUNTER — Encounter: Payer: Medicaid Other | Admitting: Family Medicine

## 2020-12-25 ENCOUNTER — Ambulatory Visit (INDEPENDENT_AMBULATORY_CARE_PROVIDER_SITE_OTHER): Payer: Medicaid Other | Admitting: Internal Medicine

## 2020-12-25 VITALS — BP 120/82 | HR 92 | Ht 67.0 in | Wt 330.0 lb

## 2020-12-25 DIAGNOSIS — R718 Other abnormality of red blood cells: Secondary | ICD-10-CM

## 2020-12-25 DIAGNOSIS — R12 Heartburn: Secondary | ICD-10-CM | POA: Diagnosis not present

## 2020-12-25 DIAGNOSIS — I480 Paroxysmal atrial fibrillation: Secondary | ICD-10-CM

## 2020-12-25 DIAGNOSIS — K625 Hemorrhage of anus and rectum: Secondary | ICD-10-CM

## 2020-12-25 DIAGNOSIS — I428 Other cardiomyopathies: Secondary | ICD-10-CM | POA: Diagnosis not present

## 2020-12-25 DIAGNOSIS — E611 Iron deficiency: Secondary | ICD-10-CM | POA: Diagnosis not present

## 2020-12-25 DIAGNOSIS — Z4502 Encounter for adjustment and management of automatic implantable cardiac defibrillator: Secondary | ICD-10-CM

## 2020-12-25 DIAGNOSIS — Z7901 Long term (current) use of anticoagulants: Secondary | ICD-10-CM | POA: Diagnosis not present

## 2020-12-25 LAB — CBC WITH DIFFERENTIAL/PLATELET
Basophils Absolute: 0.1 10*3/uL (ref 0.0–0.1)
Basophils Relative: 1.3 % (ref 0.0–3.0)
Eosinophils Absolute: 0.1 10*3/uL (ref 0.0–0.7)
Eosinophils Relative: 1 % (ref 0.0–5.0)
HCT: 42.1 % (ref 39.0–52.0)
Hemoglobin: 13.9 g/dL (ref 13.0–17.0)
Lymphocytes Relative: 28 % (ref 12.0–46.0)
Lymphs Abs: 2.2 10*3/uL (ref 0.7–4.0)
MCHC: 32.9 g/dL (ref 30.0–36.0)
MCV: 78.2 fl (ref 78.0–100.0)
Monocytes Absolute: 0.5 10*3/uL (ref 0.1–1.0)
Monocytes Relative: 6.2 % (ref 3.0–12.0)
Neutro Abs: 5 10*3/uL (ref 1.4–7.7)
Neutrophils Relative %: 63.5 % (ref 43.0–77.0)
Platelets: 318 10*3/uL (ref 150.0–400.0)
RBC: 5.38 Mil/uL (ref 4.22–5.81)
RDW: 17.2 % — ABNORMAL HIGH (ref 11.5–15.5)
WBC: 7.9 10*3/uL (ref 4.0–10.5)

## 2020-12-25 LAB — FERRITIN: Ferritin: 19.4 ng/mL — ABNORMAL LOW (ref 22.0–322.0)

## 2020-12-25 MED ORDER — NA SULFATE-K SULFATE-MG SULF 17.5-3.13-1.6 GM/177ML PO SOLN: 1.0000 | Freq: Once | ORAL | 0 refills | Status: DC

## 2020-12-25 MED ORDER — FAMOTIDINE 40 MG PO TABS: 40.0000 mg | ORAL_TABLET | Freq: Every day | ORAL | 2 refills | Status: DC | PRN

## 2020-12-25 MED FILL — FAMOTIDINE 40 MG TABLET: 40 | 30 days supply | Qty: 30 | Fill #0

## 2020-12-25 MED FILL — SUPREP BOWEL PREP KIT: 17.5-3.13-1 | 1 days supply | Qty: 354 | Fill #0

## 2020-12-25 NOTE — Telephone Encounter (Signed)
We got clearance from cardiology to hold his Eliquis for 2 days. Patient informed.

## 2020-12-25 NOTE — Progress Notes (Signed)
Jeffrey [name] 45 y.o. January 11, 1976 [id number]  Assessment & Plan:   Encounter Diagnoses  Name Primary?  . Rectal bleeding Yes  . Heartburn   . Long term current use of anticoagulant - apixaban   . NICM (nonischemic cardiomyopathy) ([place])   . ICD (implantable cardioverter-defibrillator) battery depletion   . Microcytosis   . Paroxysmal atrial fibrillation (HCC)      Evaluate with colonoscopy.  Treat heartburn with diet changes and as needed famotidine.  Try to address obesity issues later.  Given his use of apixaban his nonischemic cardiomyopathy and AICD, as well as his obesity he is at higher risk of procedural and periprocedural complications.  For a better safety net his procedure will be performed at the hospital.  He will hold his apixaban 2 days prior to the procedure, he is aware of the rare but real risk of blood clot and embolus off that medication.  We will clarify with cardiology that it is acceptable to take this risk to reduce the risk of bleeding from the procedure.  Other typical risks of endoscopy procedures reviewed with the patient and he understands and agrees to proceed.  He has reviewed the informed consent piece.  He has had a declining size in his red cells with an MCV that is now below 80 evaluate with repeat CBC and ferritin   Orders Placed This Encounter  Procedures  . Procedural/ Surgical Case Request: COLONOSCOPY WITH PROPOFOL  . Ferritin  . CBC with Differential/Platelet  . Ambulatory referral to Gastroenterology    Meds ordered this encounter  Medications  . famotidine (PEPCID) 40 MG tablet    Sig: Take 1 tablet (40 mg total) by mouth daily as needed for heartburn or indigestion.    Dispense:  30 tablet    Refill:  2  . Na Sulfate-K Sulfate-Mg Sulf 17.5-3.13-1.6 GM/177ML SOLN    Sig: Take 1 kit by mouth once for 1 dose.    Dispense:  354 mL    Refill:  0   Lab Results  Component Value Date   WBC 7.9 12/25/2020   HGB 13.9 12/25/2020   HCT  42.1 12/25/2020   MCV 78.2 12/25/2020   PLT 318.0 12/25/2020   Lab Results  Component Value Date   FERRITIN 19.4 (L) 12/25/2020    This shows iron deficiency.  For completeness we will add an EGD to his procedure.  I will have him start ferrous sulfate and hold before his colonoscopy.  EGD and colonoscopy performed in early May.  I appreciate the opportunity to care for this patient. CC: Jeffrey Glatter, FNP  Subjective:   Chief Complaint: Blood in stool/rectal bleeding  HPI The patient is a morbidly obese 45 year old African-American man with severe cardiomyopathy and AICD in place, chronic Eliquis therapy for paroxysmal atrial fibrillation, morbid obesity, renal cell carcinoma who has been having intermittent rectal bleeding.  Been ongoing for several months.  He has a remote history of very self-limited bleeding.  He does not have a particular anal discomfort or protruding hemorrhoids that he is aware of.  He sees bright red blood in the bowl and also on the toilet paper.  He has not tried any over-the-counter or prescription medications.  Recent hospitalization last month, had some hypotension he had been having some diarrhea that resolved and was fluid depleted.  He improved with supportive care he was treated with some prednisone for a gout flare.  He also complains of heartburn about twice a week typically  when he eats spicy foods tomato-based sauces etc.  He will use some baking soda and feel better.  He has not had any dysphagia.  GI review of systems otherwise negative and no history of previous GI work-up  CBC Latest Ref Rng & Units 12/25/2020 11/22/2020 11/21/2020  WBC 4.0 - 10.5 K/uL 7.9 6.6 7.7  Hemoglobin 13.0 - 17.0 g/dL 13.9 13.2 13.9  Hematocrit 39.0 - 52.0 % 42.1 40.3 43.8  Platelets 150.0 - 400.0 K/uL 318.0 284 313     Allergies  Allergen Reactions  . Aspirin Shortness Of Breath, Itching and Rash     Burning sensation (Patient reports he tolerates other  NSAIDS)   . Bee Venom Hives and Swelling  . Lisinopril Cough  . Tomato Rash   Current Meds  Medication Sig  . Accu-Chek FastClix Lancets MISC USE AS DIRECTED FOUR TIMES DAILY  . acetaminophen (TYLENOL) 325 MG tablet Take 2 tablets (650 mg total) by mouth every 4 (four) hours as needed for mild pain, moderate pain, fever or headache.  . allopurinol (ZYLOPRIM) 100 MG tablet Take 1 tablet (100 mg total) by mouth daily.  [name] amiodarone (PACERONE) 100 MG tablet Take 1 tablet (100 mg total) by mouth daily.  [name] apixaban (ELIQUIS) 5 MG TABS tablet Take 2 tablets (10 mg total) by mouth 2 (two) times daily for 7 days, THEN 1 tablet (5 mg total) 2 (two) times daily. (Patient taking differently: 1 tablet (5 mg total) 2 (two) times daily.)  . blood glucose meter kit and supplies Dispense based on patient and insurance preference. Use up to four times daily as directed. (FOR ICD-10 E10.9, E11.9).  . busPIRone (BUSPAR) 10 MG tablet Take 1 tablet (10 mg total) by mouth 2 (two) times daily.  . carvedilol (COREG) 6.25 MG tablet Take 1 tablet (6.25 mg total) by mouth 2 (two) times daily.  . cetirizine (ZYRTEC) 10 MG tablet Take 1 tablet (10 mg total) by mouth daily.  . Continuous Blood Gluc Receiver (FREESTYLE LIBRE 14 DAY READER) DEVI 1 each by Does not apply route 3 (three) times daily between meals as needed.  . Continuous Blood Gluc Sensor (GUARDIAN SENSOR 3) MISC 1 Device by Does not apply route 3 (three) times daily between meals as needed.  . empagliflozin (JARDIANCE) 10 MG TABS tablet Take 1 tablet (10 mg total) by mouth daily before breakfast.  . famotidine (PEPCID) 40 MG tablet Take 1 tablet (40 mg total) by mouth daily as needed for heartburn or indigestion.  . fluticasone (FLONASE) 50 MCG/ACT nasal spray Place 2 sprays into both nostrils daily. (Patient taking differently: Place 2 sprays into both nostrils daily as needed for allergies.)  . gabapentin (NEURONTIN) 300 MG capsule Take 1 capsule (300 mg  total) by mouth 3 (three) times daily.  [name] glipiZIDE (GLUCOTROL) 10 MG tablet Take 1 tablet (10 mg total) by mouth 2 (two) times daily before a meal.  . glucose blood (ACCU-CHEK GUIDE) test strip USE AS DIRECTED FOUR TIMES DAILY  . insulin NPH-regular Human (70-30) 100 UNIT/ML injection Inject 38 Units into the skin 2 (two) times daily with a meal.  . Magnesium Oxide 200 MG TABS Take 100 mg by mouth daily.  . Na Sulfate-K Sulfate-Mg Sulf 17.5-3.13-1.6 GM/177ML SOLN Take 1 kit by mouth once for 1 dose.  [name] NOVOLOG FLEXPEN 100 UNIT/ML FlexPen INJECT 0-15 UNIT(S) INTO THE SKIN THREE TIMES DAILY WITH MEALS VIA SLIDING SCALE (Patient taking differently: Inject 0-15 Units into the skin See admin  instructions. Tid with meals per sliding scale)  . sacubitril-valsartan (ENTRESTO) 24-26 MG Take 1 tablet by mouth 2 (two) times daily.  [name] spironolactone (ALDACTONE) 25 MG tablet Take 0.5 tablets (12.5 mg total) by mouth daily.  [name] torsemide (DEMADEX) 20 MG tablet Take 1 tablet (20 mg total) by mouth daily.  . traZODone (DESYREL) 100 MG tablet Take 1 tablet (100 mg total) by mouth at bedtime. For sleep  . TRUEPLUS 5-BEVEL PEN NEEDLES 31G X 5 MM MISC USE AS DIRECTED  . TRUEPLUS INSULIN SYRINGE 31G X 5/16" 0.5 ML MISC USE TO INJECT INSULIN TWICE DAILY.  [name] Vitamin D, Ergocalciferol, (DRISDOL) 1.25 MG (50000 UNIT) CAPS capsule Take 1 capsule (50,000 Units total) by mouth once a week. (Patient taking differently: Take 50,000 Units by mouth every Monday.)   Past Medical History:  Diagnosis Date  . Anxiety   . Aspirin allergy   . Childhood asthma   . Chronic systolic CHF (congestive heart failure) ([place])    a. EF 20-25% in 2012. b. EF 45-50% in 10/2011 with nonischemic nuc - presumed NICM. c. 12/2014 Echo: Sev depressed LV fxn, sev dil LV, mild LVH, mild MR, sev dil LA, mildly reduced RV fxn.  . CKD (chronic kidney disease) stage 2, GFR 60-89 ml/min   . H/O vasectomy 12/2019  . High cholesterol   . Hypertension   .  Morbid obesity ([place])   . Nephrolithiasis   . OSA on CPAP   . Paroxysmal atrial fibrillation (HCC)   . Presumed NICM    a. 04/2014 Myoview: EF 26%, glob HK, sev glob HK, ? prior infarct;  b. Never cathed 2/2 CKD.  [name] Renal cell carcinoma ([place])    a. s/p Rt robotic assisted partial converted to radical nephrectomy on 01/2013.  . Troponin level elevated    a. 04/2014, 12/2014: felt due to CHF.  . Type II diabetes mellitus ([place])   . Ventricular tachycardia ([place])    a. appropriate ICD therapy 12/2017   Past Surgical History:  Procedure Laterality Date  . APPENDECTOMY  07/2004  . CARDIAC CATHETERIZATION N/A 05/17/2015   Procedure: Right/Left Heart Cath and Coronary Angiography;  Surgeon: Jolaine Artist, MD;  Location: [facility];  Service: Cardiovascular;  Laterality: N/A;  . EP IMPLANTABLE DEVICE N/A 05/17/2015   Procedure: SubQ ICD Implant;  Surgeon: Will Meredith Leeds, MD;  Location: [facility];  Service: Cardiovascular;  Laterality: N/A;  . ROBOTIC ASSISTED LAPAROSCOPIC LYSIS OF ADHESION  01/13/2013   Procedure: ROBOTIC ASSISTED LAPAROSCOPIC LYSIS OF ADHESION EXTENSIVE;  Surgeon: Alexis Frock, MD;  Location: [facility];  Service: Urology;;  . ROBOTIC ASSITED PARTIAL NEPHRECTOMY Right 01/13/2013   Procedure: ROBOTIC ASSITED PARTIAL NEPHRECTOMY CONVERTED TO ROBOTIC ASSISTED RIGHT RADICAL NEPHRECTOMY;  Surgeon: Alexis Frock, MD;  Location: [facility];  Service: Urology;  Laterality: Right;  . SUBQ ICD CHANGEOUT N/A 05/16/2020   Procedure: SUBQ ICD CHANGEOUT;  Surgeon: [name], MD;  Location: [facility];  Service: Cardiovascular;  Laterality: N/A;  . SUBQ ICD CHANGEOUT N/A 05/15/2020   Procedure: BMWU ICD CHANGEOUT;  Surgeon: [name], MD;  Location: [facility];  Service: Cardiovascular;  Laterality: N/A;  . 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   Review of Systems See HPI otherwise negative Objective:   Physical Exam BP 120/82   Pulse 92   Ht '5\' 7"'  (1.702 m)   Wt (!) 330 lb (149.7 kg)   BMI 51.69 kg/m  Morbidly obese black man no acute distress able to lie flat without respiratory distress Neck is supple without obvious mass The eyes are anicteric The lungs are clear There is a defibrillator implanted in the left upper outer chest wall Heart sounds normal S1-S2 regular rhythm and rate I do not hear any murmurs The abdomen is very obese soft and nontender without mass Rectal exam shows spasm and stenosis and he was tender without obvious fissure, I could not achieve relaxation to allow deeper exam no blood or stool on the finger no mass He is alert and oriented x3 He has an appropriate mood and affect  Data reviewed please see HPI

## 2020-12-25 NOTE — Telephone Encounter (Signed)
   Primary Cardiologist: Glori Bickers, MD  Chart reviewed as part of pre-operative protocol coverage.  Jeffrey [name] 's medications and past medical history reviewed by clinical pharmacist.   Patient with diagnosis of afib on Eliquis for anticoagulation.    Procedure: colonoscopy Date of procedure: 02/10/21  CHA2DS2-VASc Score = 3  This indicates a 3.2% annual risk of stroke. The patient's score is based upon: CHF History: Yes HTN History: Yes Diabetes History: Yes Stroke History: No Vascular Disease History: No Age Score: 0 Gender Score: 0   CrCl 82.7 ml/min Platelet count 318  Per office protocol, patient can hold Eliquis for 2 days prior to procedure.   I will route this recommendation to the requesting party via Epic fax function and remove from pre-op pool.  Please call with questions.  Jeffrey [name]. Jeffrey Renshaw NP-C   12/25/2020, [facility] [place] [address] Office [phone] Fax [phone]

## 2020-12-25 NOTE — Patient Instructions (Addendum)
You have been scheduled for a colonoscopy. Please follow written instructions given to you at your visit today.  Please pick up your prep supplies at the pharmacy within the next 1-3 days. If you use inhalers (even only as needed), please bring them with you on the day of your procedure.   Your provider has requested that you go to the basement level for lab work before leaving today. Press "B" on the elevator. The lab is located at the first door on the left as you exit the elevator.  Due to recent changes in healthcare laws, you may see the results of your imaging and laboratory studies on MyChart before your provider has had a chance to review them.  We understand that in some cases there may be results that are confusing or concerning to you. Not all laboratory results come back in the same time frame and the provider may be waiting for multiple results in order to interpret others.  Please give [place] 48 hours in order for your provider to thoroughly review all the results before contacting the office for clarification of your results.   We are providing you with GERD information to read and follow.  We have sent the following medications to your pharmacy for you to pick up at your convenience: Famotidine  I appreciate the opportunity to care for you. Silvano Rusk, MD, [facility]

## 2020-12-25 NOTE — Telephone Encounter (Signed)
Patient with diagnosis of afib on Eliquis for anticoagulation.    Procedure: colonoscopy Date of procedure: 02/10/21  CHA2DS2-VASc Score = 3  This indicates a 3.2% annual risk of stroke. The patient's score is based upon: CHF History: Yes HTN History: Yes Diabetes History: Yes Stroke History: No Vascular Disease History: No Age Score: 0 Gender Score: 0     CrCl 82.7 ml/min Platelet count 318  Per office protocol, patient can hold Eliquis for 2 days prior to procedure.

## 2020-12-25 NOTE — Telephone Encounter (Signed)
[facility] Pre-operative Risk Assessment     Request for surgical clearance:     Endoscopy Procedure  What type of surgery is being performed?     colonoscopy  When is this surgery scheduled?     02/10/2021  What type of clearance is required ?   Pharmacy  Are there any medications that need to be held prior to surgery and how long? Eliquis, 2 days  Practice name and name of physician performing surgery?      [facility]  What is your office phone and fax number?      Phone- [phone]  [phone]  Anesthesia type (None, local, MAC, general) ?       MAC

## 2020-12-26 ENCOUNTER — Other Ambulatory Visit: Payer: Self-pay | Admitting: Internal Medicine

## 2020-12-26 ENCOUNTER — Other Ambulatory Visit: Payer: Self-pay

## 2020-12-26 ENCOUNTER — Other Ambulatory Visit: Payer: Self-pay | Admitting: Obstetrics and Gynecology

## 2020-12-26 MED ORDER — FERROUS SULFATE 325 (65 FE) MG PO TBEC: 325.0000 mg | DELAYED_RELEASE_TABLET | Freq: Every day | ORAL | 0 refills | Status: DC

## 2020-12-26 MED FILL — FERROUS SULFATE 325 MG TAB: 325 (65 FE) | 30 days supply | Qty: 30 | Fill #0

## 2020-12-26 NOTE — Progress Notes (Signed)
fef

## 2020-12-26 NOTE — Patient Outreach (Signed)
Care Coordination  12/26/2020  RASHAN ROUNSAVILLE September 03, 1976 [id number]   RNCM called patient at scheduled time.  Patient requested to reschedule appointment-appointment rescheduled.  [name] RN, BSN [place]  [facility] - Managed Medicaid High Risk [phone].

## 2020-12-27 ENCOUNTER — Other Ambulatory Visit: Payer: Self-pay | Admitting: Obstetrics and Gynecology

## 2020-12-27 ENCOUNTER — Other Ambulatory Visit: Payer: Self-pay

## 2020-12-27 NOTE — Patient Outreach (Signed)
Medicaid Managed Care   Nurse Care Manager Note  12/27/2020 Name:  Jeffrey [name] MRN:  [id number] DOB:  1976-05-10  Jeffrey [name] is an 45 y.o. year old male who is a primary patient of [name], FNP.  The [facility] Managed Care Coordination team was consulted for assistance with:    chronic healthcare management needs.  Jeffrey [name] was given information about Medicaid Managed Care Coordination team services today. Jeffrey [name] agreed to services and verbal consent obtained.  Engaged with patient by telephone for follow up visit in response to provider referral for case management and/or care coordination services.   Assessments/Interventions:  Review of past medical history, allergies, medications, health status, including review of consultants reports, laboratory and other test data, was performed as part of comprehensive evaluation and provision of chronic care management services.  SDOH (Social Determinants of Health) assessments and interventions performed:   Care Plan  Allergies  Allergen Reactions  . Aspirin Shortness Of Breath, Itching and Rash     Burning sensation (Patient reports he tolerates other NSAIDS)   . Bee Venom Hives and Swelling  . Lisinopril Cough  . Tomato Rash    Medications Reviewed Today    Reviewed by Gayla Medicus, RN (Registered Nurse) on 12/27/20 at 1017  Med List Status: <None>  Medication Order Taking? Sig Documenting Provider Last Dose Status Informant  Accu-Chek FastClix Lancets MISC [id number] No USE AS DIRECTED FOUR TIMES DAILY [name], FNP Taking Active Self  acetaminophen (TYLENOL) 325 MG tablet [id number] No Take 2 tablets (650 mg total) by mouth every 4 (four) hours as needed for mild pain, moderate pain, fever or headache. Shirley Friar, PA-C Taking Active Self           Med Note [place] Crane, MISTY D   Fri Nov 22, 2020  2:01 AM)    allopurinol (ZYLOPRIM) 100 MG tablet [id number] No Take 1 tablet (100 mg total) by mouth daily.  [name], FNP Taking Active Self  amiodarone (PACERONE) 100 MG tablet [id number] No Take 1 tablet (100 mg total) by mouth daily. Bensimhon, Shaune Pascal, MD Taking Active Self  apixaban (ELIQUIS) 5 MG TABS tablet [id number] No Take 2 tablets (10 mg total) by mouth 2 (two) times daily for 7 days, THEN 1 tablet (5 mg total) 2 (two) times daily.  Patient taking differently: 1 tablet (5 mg total) 2 (two) times daily.   Bensimhon, Shaune Pascal, MD Taking Active Self  blood glucose meter kit and supplies [id number] No Dispense based on patient and insurance preference. Use up to four times daily as directed. (FOR ICD-10 E10.9, E11.9). [name], FNP Taking Active Self  busPIRone (BUSPAR) 10 MG tablet [id number] No Take 1 tablet (10 mg total) by mouth 2 (two) times daily. [name], FNP Taking Active Self  carvedilol (COREG) 6.25 MG tablet [id number] No Take 1 tablet (6.25 mg total) by mouth 2 (two) times daily. Bensimhon, Shaune Pascal, MD Taking Active Self  cetirizine (ZYRTEC) 10 MG tablet [id number] No Take 1 tablet (10 mg total) by mouth daily. [name], FNP Taking Active Self  Continuous Blood Gluc Receiver (FREESTYLE LIBRE 14 DAY READER) DEVI [id number] No 1 each by Does not apply route 3 (three) times daily between meals as needed. [name], FNP Taking Active Self  Continuous Blood Gluc Sensor (GUARDIAN SENSOR 3) MISC [id number] No 1 Device by Does not apply route 3 (three) times daily between meals as needed.  [name], FNP Taking Active Self  empagliflozin (JARDIANCE) 10 MG TABS tablet [id number] No Take 1 tablet (10 mg total) by mouth daily before breakfast. Rafael Bihari, FNP Taking Active   famotidine (PEPCID) 40 MG tablet [id number]  Take 1 tablet (40 mg total) by mouth daily as needed for heartburn or indigestion. Gatha Mayer, MD  Active   ferrous sulfate 325 (65 FE) MG EC tablet [id number]  Take 1 tablet (325 mg total) by mouth daily with breakfast. Gatha Mayer, MD  Active   fluticasone [facility]) 50 MCG/ACT nasal spray [id number] No Place 2 sprays into both nostrils daily.  Patient taking differently: Place 2 sprays into both nostrils daily as needed for allergies.   [name], FNP Taking Active            Med Note Dimitri Ped, AMANDA L   Fri Dec 06, 2020 12:07 PM)    gabapentin (NEURONTIN) 300 MG capsule [id number] No Take 1 capsule (300 mg total) by mouth 3 (three) times daily. [name], FNP Taking Active Self  glipiZIDE (GLUCOTROL) 10 MG tablet [id number] No Take 1 tablet (10 mg total) by mouth 2 (two) times daily before a meal. [name], FNP Taking Active Self  glucose blood (ACCU-CHEK GUIDE) test strip [id number] No USE AS DIRECTED FOUR TIMES DAILY [name], FNP Taking Active Self  insulin NPH-regular Human (70-30) 100 UNIT/ML injection [id number] No Inject 38 Units into the skin 2 (two) times daily with a meal. [name], FNP Taking Active Self  Magnesium Oxide 200 MG TABS [id number] No Take 100 mg by mouth daily. [provider] Taking Active Self  NOVOLOG FLEXPEN 100 UNIT/ML FlexPen [id number] No INJECT 0-15 UNIT(S) INTO THE SKIN THREE TIMES DAILY WITH MEALS VIA SLIDING SCALE  Patient taking differently: Inject 0-15 Units into the skin See admin instructions. Tid with meals per sliding scale   [name], FNP Taking Active   sacubitril-valsartan (ENTRESTO) 24-26 MG [id number] No Take 1 tablet by mouth 2 (two) times daily. [provider] Taking Active   spironolactone (ALDACTONE) 25 MG tablet [id number] No Take 0.5 tablets (12.5 mg total) by mouth daily. [name], FNP Taking Active Self  torsemide (DEMADEX) 20 MG tablet [id number] No Take 1 tablet (20 mg total) by mouth daily. Mckinley Jewel, MD Taking Active   traZODone (DESYREL) 100 MG tablet [id number] No Take 1 tablet (100 mg total) by mouth at bedtime. For sleep [name], FNP Taking Active Self  TRUEPLUS 5-BEVEL PEN  NEEDLES 31G X 5 MM MISC [id number] No USE AS DIRECTED [name], FNP Taking Active   TRUEPLUS INSULIN SYRINGE 31G X 5/16" 0.5 ML MISC [id number] No USE TO INJECT INSULIN TWICE DAILY. [name], FNP Taking Active Self  Vitamin D, Ergocalciferol, (DRISDOL) 1.25 MG (50000 UNIT) CAPS capsule [id number] No Take 1 capsule (50,000 Units total) by mouth once a week.  Patient taking differently: Take 50,000 Units by mouth every Monday.   [name], FNP Taking Active Self          Patient Active Problem List   Diagnosis Date Noted  . Iron deficiency 12/25/2020  . Long term current use of anticoagulant - apixaban 12/25/2020  . Paroxysmal atrial fibrillation (HCC)   . Lactic acidosis 11/22/2020  . Hypotension 11/22/2020  . Aspiration into airway   . ICD (implantable cardioverter-defibrillator) battery depletion 05/15/2020  . Endotracheal tube present   . Respiratory  failure ([place])   . Acute encephalopathy   . Acute pulmonary edema (HCC)   . NICM (nonischemic cardiomyopathy) ([place]) 04/24/2020  . S/P vasectomy 12/20/2019  . Anxiety 12/20/2019  . Hemoglobin A1C between 7% and 9% indicating borderline diabetic control 12/20/2019  . Seasonal allergies 12/20/2019  . Insomnia 12/20/2019  . Vasectomy evaluation 06/20/2019  . Visual problems 06/20/2019  . Blurry vision, bilateral 06/20/2019  . Hyperglycemia 02/13/2019  . Hyperosmolar (nonketotic) coma ([place]) 01/19/2019  . AICD (automatic cardioverter/defibrillator) present 01/19/2019  . Hyperlipidemia 12/07/2018  . Follow-up exam 11/22/2018  . Class 3 severe obesity due to excess calories with serious comorbidity and body mass index (BMI) of 45.0 to 49.9 in adult ([place]) 11/22/2018  . Dizziness 11/22/2018  . Lingular pneumonia 08/04/2018  . Ventricular tachycardia ([place]) 12/30/2017  . PAF (paroxysmal atrial fibrillation) ([place])   . Dilated cardiomyopathy ([place])   . Pollen allergy 02/17/2017  . Dyslipidemia 02/17/2017  .  Hematochezia 11/08/2015  . CKD (chronic kidney disease), stage III ([place]) 06/20/2015  . Cardiac defibrillator in situ   . Chronic systolic CHF (congestive heart failure) ([place]) 03/11/2015  . Gouty arthritis 03/11/2015  . Chest pain 12/28/2014  . AKI (acute kidney injury) ([place]) 04/08/2014  . Aspirin allergy 04/08/2014  . Renal cell carcinoma ([place])   . Gout attack 11/10/2011  . Morbid obesity ([place]) 10/23/2011  . OSA (obstructive sleep apnea) 10/23/2011  . Type 2 diabetes mellitus with stage 3 chronic kidney disease, with long-term current use of insulin ([place]) 10/22/2011  . Hyperlipidemia associated with type 2 diabetes mellitus ([place]) 12/25/2010  . Hypertension associated with diabetes ([place]) 12/25/2010    Conditions to be addressed/monitored per PCP order:  chronic healthcare management needs-CHF, Afib, HTN, CKD, OSA, gout, DM2, h/o renal cell carcinoma.  Care Plan : General Plan of Care (Adult)  Updates made by Gayla Medicus, RN since 12/27/2020 12:00 AM    Problem: Health Promotion or Disease Self-Management (General Plan of Care)   Priority: High  Onset Date: 09/24/2020  Note:   Current Barriers:  . Chronic Disease Management support and education needs . Update 11/28/20:  Patient recently discharged from hospital 11/26/20-follow up appointments scheduled.  Nurse Case Manager Clinical Goal(s):  [name] Over the next 90 days, patient will attend all scheduled medical appointments:   Interventions:  . Inter-disciplinary care team collaboration (see longitudinal plan of care) . Evaluation of current treatment plan and patient's adherence to plan as established by provider.  [name] Update 11/28/20:  Continue monitoring BP daily and reporting readings outside of parameters. [name] Update 12/27/20:  Blood pressure 128/80. [name] Reviewed medications with patient. . Discussed plans with patient for ongoing care management follow up and provided patient with direct contact information for care management  team . Provided patient with educational materials related to exercise. . Reviewed scheduled/upcoming provider appointments. [name] Update 11/28/20:  Discussed low glycemic vs high glycemic foods.  Pharmacy referral made for review of medications. . Pharmacy referral for medication review.  Patient Goals/Self-Care Activities Over the next 90 days, patient will:  -Attends all scheduled provider appointments Calls provider office for new concerns or questions  Follow Up Plan: The Managed Medicaid care management team will reach out to the patient again over the next 30 days.  The patient has been provided with contact information for the Managed Medicaid care management team and has been advised to call with any health related questions or concerns.     Follow Up:  Patient agrees to Care Plan  and Follow-up.  Plan: The Managed Medicaid care management team will reach out to the patient again over the next 30 days. and The patient has been provided with contact information for the Managed Medicaid care management team and has been advised to call with any health related questions or concerns.  Date/time of next scheduled RN care management/care coordination outreach:  01/22/21 at 1030.

## 2020-12-27 NOTE — Patient Instructions (Signed)
Hi Mr. Rehm, thank you for speaking with me today.  Mr. Krutz was given information about Medicaid Managed Care team care coordination services as a part of their Healthy [facility] Medicaid benefit. [name] verbally consented to engagement with the [facility] Managed Care team.   For questions related to your Healthy [facility] health plan, please call: [phone] or visit the homepage here: [email or web address]  If you would like to schedule transportation through your Healthy [facility] plan, please call the following number at least 2 days in advance of your appointment: [phone]   Call the [facility] at [phone], at any time, 24 hours a day, 7 days a week. If you are in danger or need immediate medical attention call 911.  Mr. Kakos - following are the goals we discussed in your visit today:  Goals Addressed            This Visit's Progress   . Protect My Health       Timeframe:  Long-Range Goal Priority:  High Start Date:    09/24/20                         Expected End Date:    01/27/21                - schedule recommended health tests  - schedule and keep appointment for annual check-up  Update 11/28/20:Patient recently discharged from hospital 11/26/20-follow up appointments scheduled. Update 12/27/20:  Patient with no complaints today-scheduled for colonoscopy 02/05/21-rectal bleeding.     Patient verbalizes understanding of instructions provided today.   The Managed Medicaid care management team will reach out to the patient again over the next 30 days.  The patient has been provided with contact information for the Managed Medicaid care management team and has been advised to call with any health related questions or concerns.   [name] RN, BSN [place]  [facility] - Managed Medicaid High Risk [phone].  Following is a copy of your plan of care:   Patient Care Plan: General Plan of Care (Adult)    Problem Identified: Health Promotion or Disease Self-Management (General Plan of Care)   Priority: High  Onset Date: 09/24/2020  Note:   Current Barriers:  . Chronic Disease Management support and education needs . Update 11/28/20:  Patient recently discharged from hospital 11/26/20-follow up appointments scheduled.  Nurse Case Manager Clinical Goal(s):  [name] Over the next 90 days, patient will attend all scheduled medical appointments:   Interventions:  . Inter-disciplinary care team collaboration (see longitudinal plan of care) . Evaluation of current treatment plan and patient's adherence to plan as established by provider.  [name] Update 11/28/20:  Continue monitoring BP daily and reporting readings outside of parameters. [name] Update 12/27/20:  Blood pressure 128/80. [name] Reviewed medications with patient. . Discussed plans with patient for ongoing care management follow up and provided patient with direct contact information for care management team . Provided patient with educational materials related to exercise. . Reviewed scheduled/upcoming provider appointments. [name] Update 11/28/20:  Discussed low glycemic vs high glycemic foods.  Pharmacy referral made for review of medications. . Pharmacy referral for medication review.  Patient Goals/Self-Care Activities Over the next 90 days, patient will:  -Attends all scheduled provider appointments Calls provider office for new concerns or questions  Follow Up Plan: The Managed Medicaid care management team will reach out to the patient again over the  next 30 days.  The patient has been provided with contact information for the Managed Medicaid care management team and has been advised to call with any health related questions or concerns.

## 2020-12-31 ENCOUNTER — Other Ambulatory Visit: Payer: Self-pay

## 2020-12-31 ENCOUNTER — Encounter: Payer: Self-pay | Admitting: Family Medicine

## 2020-12-31 ENCOUNTER — Ambulatory Visit (INDEPENDENT_AMBULATORY_CARE_PROVIDER_SITE_OTHER): Payer: Medicaid Other | Admitting: Family Medicine

## 2020-12-31 VITALS — BP 131/87 | HR 79 | Ht 67.0 in | Wt 332.0 lb

## 2020-12-31 DIAGNOSIS — N183 Chronic kidney disease, stage 3 unspecified: Secondary | ICD-10-CM | POA: Diagnosis not present

## 2020-12-31 DIAGNOSIS — Z09 Encounter for follow-up examination after completed treatment for conditions other than malignant neoplasm: Secondary | ICD-10-CM | POA: Diagnosis not present

## 2020-12-31 DIAGNOSIS — Z6841 Body Mass Index (BMI) 40.0 and over, adult: Secondary | ICD-10-CM

## 2020-12-31 DIAGNOSIS — Z794 Long term (current) use of insulin: Secondary | ICD-10-CM | POA: Diagnosis not present

## 2020-12-31 DIAGNOSIS — I10 Essential (primary) hypertension: Secondary | ICD-10-CM

## 2020-12-31 DIAGNOSIS — E66813 Obesity, class 3: Secondary | ICD-10-CM

## 2020-12-31 DIAGNOSIS — E1122 Type 2 diabetes mellitus with diabetic chronic kidney disease: Secondary | ICD-10-CM | POA: Diagnosis not present

## 2020-12-31 DIAGNOSIS — Z0000 Encounter for general adult medical examination without abnormal findings: Secondary | ICD-10-CM | POA: Diagnosis not present

## 2020-12-31 NOTE — Patient Instructions (Signed)
Please contact your Opthalmaologist and schedule a Diabetic Eye Exam.

## 2020-12-31 NOTE — Progress Notes (Signed)
Patient [facility]   Annual Physical  Subjective:  Patient ID: Jeffrey [name], male    DOB: 05-22-1976  Age: 45 y.o. MRN: [id number]  CC:  Chief Complaint  Patient presents with  . Annual Exam    HPI [name] is a 45 year old male who presents for his Annual Physical today.    Patient Active Problem List   Diagnosis Date Noted  . Iron deficiency 12/25/2020  . Long term current use of anticoagulant - apixaban 12/25/2020  . Paroxysmal atrial fibrillation (HCC)   . Lactic acidosis 11/22/2020  . Hypotension 11/22/2020  . Aspiration into airway   . ICD (implantable cardioverter-defibrillator) battery depletion 05/15/2020  . Endotracheal tube present   . Respiratory failure ([place])   . Acute encephalopathy   . Acute pulmonary edema (HCC)   . NICM (nonischemic cardiomyopathy) ([place]) 04/24/2020  . S/P vasectomy 12/20/2019  . Anxiety 12/20/2019  . Hemoglobin A1C between 7% and 9% indicating borderline diabetic control 12/20/2019  . Seasonal allergies 12/20/2019  . Insomnia 12/20/2019  . Vasectomy evaluation 06/20/2019  . Visual problems 06/20/2019  . Blurry vision, bilateral 06/20/2019  . Hyperglycemia 02/13/2019  . Hyperosmolar (nonketotic) coma ([place]) 01/19/2019  . AICD (automatic cardioverter/defibrillator) present 01/19/2019  . Hyperlipidemia 12/07/2018  . Follow-up exam 11/22/2018  . Class 3 severe obesity due to excess calories with serious comorbidity and body mass index (BMI) of 45.0 to 49.9 in adult ([place]) 11/22/2018  . Dizziness 11/22/2018  . Lingular pneumonia 08/04/2018  . Ventricular tachycardia ([place]) 12/30/2017  . PAF (paroxysmal atrial fibrillation) ([place])   . Dilated cardiomyopathy ([place])   . Pollen allergy 02/17/2017  . Dyslipidemia 02/17/2017  . Hematochezia 11/08/2015  . CKD (chronic kidney disease), stage III ([place]) 06/20/2015  . Cardiac defibrillator in situ   . Chronic systolic CHF (congestive heart failure) ([place])  03/11/2015  . Gouty arthritis 03/11/2015  . Chest pain 12/28/2014  . AKI (acute kidney injury) ([place]) 04/08/2014  . Aspirin allergy 04/08/2014  . Renal cell carcinoma ([place])   . Gout attack 11/10/2011  . Morbid obesity ([place]) 10/23/2011  . OSA (obstructive sleep apnea) 10/23/2011  . Type 2 diabetes mellitus with stage 3 chronic kidney disease, with long-term current use of insulin ([place]) 10/22/2011  . Hyperlipidemia associated with type 2 diabetes mellitus ([place]) 12/25/2010  . Hypertension associated with diabetes ([place]) 12/25/2010   Current Status: Since his last office visit, he is doing well with no complaints. His most recent normal range of preprandial blood glucose levels have been between 120-125. He has seen low range of 75 and high of 170 since his last office visit. He denies fatigue, frequent urination, blurred vision, excessive hunger, excessive thirst, weight gain, weight loss, and poor wound healing. He continues to check his feet regularly. His is scheduled for Endo/Colonoscopy. Denies GI problems such as diarrhea, and constipation. He has no reports of blood in stools, dysuria and hematuria. He denies fevers, chills, fatigue, recent infections, weight loss, and night sweats.  No depression or anxiety report. He is taking all medications as prescribed. He denies pain today.   Past Medical History:  Diagnosis Date  . Anxiety   . Aspirin allergy   . Childhood asthma   . Chronic systolic CHF (congestive heart failure) ([place])    a. EF 20-25% in 2012. b. EF 45-50% in 10/2011 with nonischemic nuc - presumed NICM. c. 12/2014 Echo: Sev depressed LV fxn, sev dil LV, mild LVH, mild  MR, sev dil LA, mildly reduced RV fxn.  . CKD (chronic kidney disease) stage 2, GFR 60-89 ml/min   . H/O vasectomy 12/2019  . High cholesterol   . Hypertension   . Morbid obesity ([place])   . Nephrolithiasis   . OSA on CPAP   . Paroxysmal atrial fibrillation (HCC)   . Presumed NICM    a. 04/2014 Myoview: EF 26%,  glob HK, sev glob HK, ? prior infarct;  b. Never cathed 2/2 CKD.  [name] Renal cell carcinoma ([place])    a. s/p Rt robotic assisted partial converted to radical nephrectomy on 01/2013.  . Troponin level elevated    a. 04/2014, 12/2014: felt due to CHF.  . Type II diabetes mellitus ([place])   . Ventricular tachycardia ([place])    a. appropriate ICD therapy 12/2017    Past Surgical History:  Procedure Laterality Date  . APPENDECTOMY  07/2004  . CARDIAC CATHETERIZATION N/A 05/17/2015   Procedure: Right/Left Heart Cath and Coronary Angiography;  Surgeon: Jolaine Artist, MD;  Location: [facility];  Service: Cardiovascular;  Laterality: N/A;  . EP IMPLANTABLE DEVICE N/A 05/17/2015   Procedure: SubQ ICD Implant;  Surgeon: Will Meredith Leeds, MD;  Location: [facility];  Service: Cardiovascular;  Laterality: N/A;  . ROBOTIC ASSISTED LAPAROSCOPIC LYSIS OF ADHESION  01/13/2013   Procedure: ROBOTIC ASSISTED LAPAROSCOPIC LYSIS OF ADHESION EXTENSIVE;  Surgeon: Alexis Frock, MD;  Location: [facility];  Service: Urology;;  . ROBOTIC ASSITED PARTIAL NEPHRECTOMY Right 01/13/2013   Procedure: ROBOTIC ASSITED PARTIAL NEPHRECTOMY CONVERTED TO ROBOTIC ASSISTED RIGHT RADICAL NEPHRECTOMY;  Surgeon: Alexis Frock, MD;  Location: [facility];  Service: Urology;  Laterality: Right;  . SUBQ ICD CHANGEOUT N/A 05/16/2020   Procedure: SUBQ ICD CHANGEOUT;  Surgeon: [name], MD;  Location: [facility];  Service: Cardiovascular;  Laterality: N/A;  . SUBQ ICD CHANGEOUT N/A 05/15/2020   Procedure: PXTG ICD CHANGEOUT;  Surgeon: [name], MD;  Location: [facility];  Service: Cardiovascular;  Laterality: N/A;  . VASECTOMY      Family History  Problem Relation Age of Onset  . Hypertension Mother   . Diabetes Maternal Aunt   . Heart attack Neg Hx   . Stroke Neg Hx   . Colon cancer Neg Hx   . Pancreatic cancer Neg Hx   . Stomach cancer Neg Hx   . Liver cancer Neg Hx   . Esophageal cancer Neg Hx      Social History   Socioeconomic History  . Marital status: Significant Other    Spouse name: Not on file  . Number of children: Not on file  . Years of education: Not on file  . Highest education level: Not on file  Occupational History  . Not on file  Tobacco Use  . Smoking status: Current Every Day Smoker    Packs/day: 0.25    Years: 22.00    Pack years: 5.50    Last attempt to quit: 04/16/2015    Years since quitting: 5.7  . Smokeless tobacco: Never Used  Vaping Use  . Vaping Use: Never used  Substance and Sexual Activity  . Alcohol use: Yes    Comment: 7 drinks per week max  . Drug use: Yes    Frequency: 14.0 times per week    Types: Marijuana    Comment: uses marijuana twice a day  . Sexual activity: Yes    Birth control/protection: Condom  Other Topics Concern  .  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 Accu-Chek FastClix Lancets MISC USE AS DIRECTED FOUR TIMES DAILY 102 each 11  . acetaminophen (TYLENOL) 325 MG tablet Take 2 tablets (650 mg total) by mouth every 4 (four) hours as needed for mild pain, moderate pain, fever or headache.    . allopurinol (ZYLOPRIM) 100 MG tablet Take 1 tablet (100 mg total) by mouth daily. 90 tablet 3  . amiodarone (PACERONE) 100 MG tablet Take 1 tablet (100 mg total) by mouth daily. 30 tablet  11  . apixaban (ELIQUIS) 5 MG TABS tablet Take 2 tablets (10 mg total) by mouth 2 (two) times daily for 7 days, THEN 1 tablet (5 mg total) 2 (two) times daily. (Patient taking differently: 1 tablet (5 mg total) 2 (two) times daily.) 75 tablet 10  . blood glucose meter kit and supplies Dispense based on patient and insurance preference. Use up to four times daily as directed. (FOR ICD-10 E10.9, E11.9). 1 each 0  . busPIRone (BUSPAR) 10 MG tablet Take 1 tablet (10 mg total) by mouth 2 (two) times daily. 60 tablet 11  . carvedilol (COREG) 6.25 MG tablet Take 1 tablet (6.25 mg total) by mouth 2 (two) times daily. 30 tablet 11  . cetirizine (ZYRTEC) 10 MG tablet Take 1 tablet (10 mg total) by mouth daily. 90 tablet 3  . Continuous Blood Gluc Receiver (FREESTYLE LIBRE 14 DAY READER) DEVI 1 each by Does not apply route 3 (three) times daily between meals as needed. 1 each 1  . Continuous Blood Gluc Sensor (GUARDIAN SENSOR 3) MISC 1 Device by Does not apply route 3 (three) times daily between meals as needed. 5 each 6  . empagliflozin (JARDIANCE) 10 MG TABS tablet Take 1 tablet (10 mg total) by mouth daily before breakfast. 90 tablet 3  . famotidine (PEPCID) 40 MG tablet Take 1 tablet (40 mg total) by mouth daily as needed for heartburn or indigestion. 30 tablet 2  . ferrous sulfate 325 (65 FE) MG EC tablet Take 1 tablet (325 mg total) by mouth daily with breakfast. 90 tablet 0  . fluticasone (FLONASE) 50 MCG/ACT nasal spray Place 2 sprays into both nostrils daily. (Patient taking differently: Place 2 sprays into both nostrils daily as needed for allergies.) 16 g 6  . gabapentin (NEURONTIN) 300 MG capsule Take 1 capsule (300 mg total) by mouth 3 (three) times daily. 270 capsule 3  . glipiZIDE (GLUCOTROL) 10 MG tablet Take 1 tablet (10 mg total) by mouth 2 (two) times daily before a meal. 180 tablet 3  . glucose blood (ACCU-CHEK GUIDE) test strip USE AS DIRECTED FOUR TIMES DAILY 100 strip 11  . insulin  NPH-regular Human (70-30) 100 UNIT/ML injection Inject 38 Units into the skin 2 (two) times daily with  a meal. 60 mL 12  . Magnesium Oxide 200 MG TABS Take 100 mg by mouth daily.    [name] NOVOLOG FLEXPEN 100 UNIT/ML FlexPen INJECT 0-15 UNIT(S) INTO THE SKIN THREE TIMES DAILY WITH MEALS VIA SLIDING SCALE (Patient taking differently: Inject 0-15 Units into the skin See admin instructions. Tid with meals per sliding scale) 15 mL 12  . sacubitril-valsartan (ENTRESTO) 24-26 MG Take 1 tablet by mouth 2 (two) times daily.    [name] spironolactone (ALDACTONE) 25 MG tablet Take 0.5 tablets (12.5 mg total) by mouth daily. 45 tablet 6  . torsemide (DEMADEX) 20 MG tablet Take 1 tablet (20 mg total) by mouth daily. 60 tablet 6  . traZODone (DESYREL) 100 MG tablet Take 1 tablet (100 mg total) by mouth at bedtime. For sleep 90 tablet 3  . TRUEPLUS 5-BEVEL PEN NEEDLES 31G X 5 MM MISC USE AS DIRECTED 100 each 12  . TRUEPLUS INSULIN SYRINGE 31G X 5/16" 0.5 ML MISC USE TO INJECT INSULIN TWICE DAILY. 100 each 11  . Vitamin D, Ergocalciferol, (DRISDOL) 1.25 MG (50000 UNIT) CAPS capsule Take 1 capsule (50,000 Units total) by mouth once a week. (Patient taking differently: Take 50,000 Units by mouth every Monday.) 5 capsule 2   No facility-administered medications prior to visit.    Allergies  Allergen Reactions  . Aspirin Shortness Of Breath, Itching and Rash     Burning sensation (Patient reports he tolerates other NSAIDS)   . Bee Venom Hives and Swelling  . Lisinopril Cough  . Tomato Rash    ROS Review of Systems  Constitutional: Negative.   HENT: Negative.   Eyes: Negative.   Respiratory: Negative.   Cardiovascular: Negative.   Gastrointestinal: Negative.   Endocrine: Negative.   Genitourinary: Negative.   Musculoskeletal: Positive for arthralgias (generalized joint pain).  Skin: Negative.   Allergic/Immunologic: Negative.   Neurological: Positive for dizziness (occasional ) and headaches (occasional ).   Hematological: Negative.   Psychiatric/Behavioral: Negative.       Objective:    Physical Exam Vitals and nursing note reviewed.  Constitutional:      Appearance: Normal appearance.  HENT:     Head: Normocephalic and atraumatic.     Nose: Nose normal.     Mouth/Throat:     Mouth: Mucous membranes are moist.     Pharynx: Oropharynx is clear.  Cardiovascular:     Rate and Rhythm: Normal rate and regular rhythm.     Pulses: Normal pulses.     Heart sounds: Normal heart sounds.  Pulmonary:     Effort: Pulmonary effort is normal.     Breath sounds: Normal breath sounds.  Abdominal:     General: Bowel sounds are normal.     Palpations: Abdomen is soft.  Musculoskeletal:        General: Normal range of motion.     Cervical back: Normal range of motion and neck supple.  Skin:    General: Skin is warm and dry.  Neurological:     General: No focal deficit present.     Mental Status: He is alert and oriented to person, place, and time.  Psychiatric:        Mood and Affect: Mood normal.        Behavior: Behavior normal.        Thought Content: Thought content normal.    BP 131/87   Pulse 79   Ht '5\' 7"'  (1.702 m)   Wt (!) 332 lb (150.6 kg)  SpO2 97%   BMI 52.00 kg/m  Wt Readings from Last 3 Encounters:  12/31/20 (!) 332 lb (150.6 kg)  12/25/20 (!) 330 lb (149.7 kg)  12/06/20 (!) 336 lb 12.8 oz (152.8 kg)    Health Maintenance Due  Topic Date Due  . FOOT EXAM  11/22/2019   There are no preventive care reminders to display for this patient.  Lab Results  Component Value Date   TSH 4.120 10/28/2020   Lab Results  Component Value Date   WBC 7.9 12/25/2020   HGB 13.9 12/25/2020   HCT 42.1 12/25/2020   MCV 78.2 12/25/2020   PLT 318.0 12/25/2020   Lab Results  Component Value Date   NA 135 12/18/2020   K 4.3 12/18/2020   CO2 21 (L) 12/18/2020   GLUCOSE 276 (H) 12/18/2020   BUN 26 (H) 12/18/2020   CREATININE 1.57 (H) 12/18/2020   BILITOT 0.2 09/04/2020    ALKPHOS 74 09/04/2020   AST 17 09/04/2020   ALT 23 09/04/2020   PROT 7.2 09/04/2020   ALBUMIN 4.1 09/04/2020   CALCIUM 9.7 12/18/2020   ANIONGAP 9 12/18/2020   GFR 58.42 (L) 01/22/2015   Lab Results  Component Value Date   CHOL 188 10/28/2020   Lab Results  Component Value Date   HDL 37 (L) 10/28/2020   Lab Results  Component Value Date   LDLCALC 107 (H) 10/28/2020   Lab Results  Component Value Date   TRIG 255 (H) 10/28/2020   Lab Results  Component Value Date   CHOLHDL 5.1 (H) 10/28/2020   Lab Results  Component Value Date   HGBA1C 8.6 (H) 10/28/2020   Assessment & Plan:   1. Annual physical exam Physical assessment within normal for age. Basic Neurology assessment normal.  Recommend monthly self testicular exams. Recommend daily multivitamin for men Recommend strength training in 150 minutes of cardiovascular exercise per week  2. Class 3 severe obesity due to excess calories with serious comorbidity and body mass index (BMI) of 50.0 to 59.9 in adult [facility]) Body mass index is 52 kg/m.  Goal BMI  is <30. Encouraged efforts to reduce weight include engaging in physical activity as tolerated with goal of 150 minutes per week. Improve dietary choices and eat a meal regimen consistent with a Mediterranean or DASH diet. Reduce simple carbohydrates. Do not skip meals and eat healthy snacks throughout the day to avoid over-eating at dinner. Set a goal weight loss that is achievable for you. - Amb Referral to Bariatric Surgery  3. Essential hypertension The current medical regimen is effective; blood pressure is stable at 131/87 today; continue present plan and medications as prescribed. He will continue to take medications as prescribed, to decrease high sodium intake, excessive alcohol intake, increase potassium intake, smoking cessation, and increase physical activity of at least 30 minutes of cardio activity daily. He will continue to follow Heart Healthy or DASH  diet.  4. Type 2 diabetes mellitus with stage 3 chronic kidney disease, with long-term current use of insulin, unspecified whether stage 3a or 3b CKD ([place]) He will continue medication as prescribed, to decrease foods/beverages high in sugars and carbs and follow Heart Healthy or DASH diet. Increase physical activity to at least 30 minutes cardio exercise daily.   5. Healthcare maintenance - CBC with Differential - Comprehensive metabolic panel - TSH - Vitamin D, 25-hydroxy - Vitamin B12 - Lipid Panel  6. Follow up He will follow up in 6 months.    No orders  of the defined types were placed in this encounter.   Orders Placed This Encounter  Procedures  . CBC with Differential  . Comprehensive metabolic panel  . TSH  . Vitamin D, 25-hydroxy  . Vitamin B12  . Lipid Panel  . Amb Referral to Bariatric Surgery     Referral Orders     Amb Referral to Bariatric Surgery   Kathe Becton, MSN, ANE, FNP-BC [facility] [address] [place], [place] [address] [phone] fax  Problem List Items Addressed This Visit      Endocrine   Type 2 diabetes mellitus with stage 3 chronic kidney disease, with long-term current use of insulin ([place]) (Chronic)    Other Visit Diagnoses    Annual physical exam    -  Primary   Class 3 severe obesity due to excess calories with serious comorbidity and body mass index (BMI) of 50.0 to 59.9 in adult [facility])       Relevant Orders   Amb Referral to Bariatric Surgery   Essential hypertension       Healthcare maintenance       Relevant Orders   CBC with Differential   Comprehensive metabolic panel   TSH   Vitamin D, 25-hydroxy   Vitamin B12   Lipid Panel   Follow up          No orders of the defined types were placed in this encounter.   Follow-up: No follow-ups on file.    [name], FNP

## 2021-01-02 ENCOUNTER — Encounter: Payer: Self-pay | Admitting: Family Medicine

## 2021-01-04 ENCOUNTER — Other Ambulatory Visit: Payer: Self-pay

## 2021-01-06 ENCOUNTER — Other Ambulatory Visit: Payer: Self-pay

## 2021-01-08 NOTE — Progress Notes (Signed)
Advanced Heart Failure Clinic Note   Primary Care Provider:  [name], FNP Primary Cardiologist:  Dr. [name] Primary HF Cardiologist: Dr. [name]  History: Jeffrey [name] is a 45 y.o. male with a history of  poorly controlled HTN, R renal cell carcinoma s/p nephrectomy, CKD IIIa, DM2, OSA, gout, morbid obesity and systolic HF due to NICM.   Echo in 2012 with EF 20-25%.  In 1/13 had nuclear study EF 45-50% suggestive of NICM.  Admitted 3/16 secondary to atypical chest pain, dyspnea, and mild troponin elevation. Echo with "severely reduced LV function" - EF not quantified due to poor windows. Cath 8/16 showed normal coronaries and moderately elevated filling pressures with moderately depressed cardiac output. [place] s- ICD implanted.  Admitted 9/18 with ADHF in setting of new afib. Started on amiodarone. Pt diuresed and converted to NSR without requiring DCCV. Started on Eliquis for [facility].  Admitted 3/19 with ICD shock. Electrolytes stable. Echo 12/31/17 with LVEF 25-30%.   Pt admitted 05/15/20 for S-ICD generator change. Course was complicated with acute hypoxic/hypercapnic respiratory failure due to possible flash pulmonary edema. Had traumatic intubation resulting in aspiration and requiring ventilator support and short term pressor support.  Hospitalized 2/17-2/22/22 for hypotension  & dizziness. Found to have AKI, likely 2/2 to recent diarrhea, diuretic use and poor po intake. Gently hydrated with IVF, Entresto and torsemide decreased, and BiDil stopped. Discharge weight 323 lbs.  Today he returns for HF follow up.Overall feeling fine. Denies SOB/PND/Orthopnea. Using CPAP.  Active with his 3 year child. Appetite ok. No fever or chills. Weight at home 327-330 pounds. Taking all medications. No longer working.   Cardiac Studies: Echo (9/21): EF 20-25%, severe LV dysfunction, Grade I DD, RV ok. Echo (3/19): EF 25-30%, Mod MR, mild RV dilation, Mod TR, RVSP 67 mm  Hg.  Echo (4/17): EF 20-25%  Grade III DD Echo (8/16). EF 20-25%, grade II diastolic dysfunction normal RV size with mild HK    L/RHC (8/16): RA = 7 RV = 42/3/7 PA = 43/30 (39) PCW = 26 Fick cardiac output/index = 4.9/2.1 PVR = 2.7 WU SVR = 1651 FA sat = 95% PA sat = 65%, 65% Ao = 122/97 (109) LV = 110/31/33  1) Normal coronaries 2) Moderately elevated filling pressures with moderately depressed cardiac output  Review of systems complete and found to be negative unless listed in HPI.    Past Medical History:  Diagnosis Date  . Anxiety   . Aspirin allergy   . Childhood asthma   . Chronic systolic CHF (congestive heart failure) ([place])    a. EF 20-25% in 2012. b. EF 45-50% in 10/2011 with nonischemic nuc - presumed NICM. c. 12/2014 Echo: Sev depressed LV fxn, sev dil LV, mild LVH, mild MR, sev dil LA, mildly reduced RV fxn.  . CKD (chronic kidney disease) stage 2, GFR 60-89 ml/min   . H/O vasectomy 12/2019  . High cholesterol   . Hypertension   . Morbid obesity ([place])   . Nephrolithiasis   . OSA on CPAP   . Paroxysmal atrial fibrillation (HCC)   . Presumed NICM    a. 04/2014 Myoview: EF 26%, glob HK, sev glob HK, ? prior infarct;  b. Never cathed 2/2 CKD.  [name] Renal cell carcinoma ([place])    a. s/p Rt robotic assisted partial converted to radical nephrectomy on 01/2013.  . Troponin level elevated    a. 04/2014, 12/2014: felt due to CHF.  [name]  Type II diabetes mellitus ([place])   . Ventricular tachycardia ([place])    a. appropriate ICD therapy 12/2017   Past Surgical History:  Procedure Laterality Date  . APPENDECTOMY  07/2004  . CARDIAC CATHETERIZATION N/A 05/17/2015   Procedure: Right/Left Heart Cath and Coronary Angiography;  Surgeon: Jolaine Artist, MD;  Location: [place] [facility];  Service: Cardiovascular;  Laterality: N/A;  . EP IMPLANTABLE DEVICE N/A 05/17/2015   Procedure: SubQ ICD Implant;  Surgeon: Will Meredith Leeds, MD;  Location: [place] [facility];  Service:  Cardiovascular;  Laterality: N/A;  . ROBOTIC ASSISTED LAPAROSCOPIC LYSIS OF ADHESION  01/13/2013   Procedure: ROBOTIC ASSISTED LAPAROSCOPIC LYSIS OF ADHESION EXTENSIVE;  Surgeon: Alexis Frock, MD;  Location: [facility];  Service: Urology;;  . ROBOTIC ASSITED PARTIAL NEPHRECTOMY Right 01/13/2013   Procedure: ROBOTIC ASSITED PARTIAL NEPHRECTOMY CONVERTED TO ROBOTIC ASSISTED RIGHT RADICAL NEPHRECTOMY;  Surgeon: Alexis Frock, MD;  Location: [facility];  Service: Urology;  Laterality: Right;  . SUBQ ICD CHANGEOUT N/A 05/16/2020   Procedure: SUBQ ICD CHANGEOUT;  Surgeon: [name], MD;  Location: [place] [facility];  Service: Cardiovascular;  Laterality: N/A;  . SUBQ ICD CHANGEOUT N/A 05/15/2020   Procedure: JSHF ICD CHANGEOUT;  Surgeon: [name], MD;  Location: [place] [facility];  Service: Cardiovascular;  Laterality: N/A;  . VASECTOMY     Allergies  Allergies  Allergen Reactions  . Aspirin Shortness Of Breath, Itching and Rash     Burning sensation (Patient reports he tolerates other NSAIDS)   . Bee Venom Hives and Swelling  . Lisinopril Cough  . Tomato Rash   Home Medications Current Outpatient Medications  Medication Sig Dispense Refill  . acetaminophen (TYLENOL) 325 MG tablet Take 2 tablets (650 mg total) by mouth every 4 (four) hours as needed for mild pain, moderate pain, fever or headache.    . allopurinol (ZYLOPRIM) 100 MG tablet Take 1 tablet (100 mg total) by mouth daily. 90 tablet 3  . amiodarone (PACERONE) 100 MG tablet TAKE 1 TABLET (100 MG TOTAL) BY MOUTH DAILY. 30 tablet 11  . apixaban (ELIQUIS) 5 MG TABS tablet TAKE 1 TABLET (5 MG TOTAL) BY MOUTH 2 (TWO) TIMES DAILY. RESUME WITH EVENING DOSE ON SUNDAY 60 tablet 10  . blood glucose meter kit and supplies Dispense based on patient and insurance preference. Use up to four times daily as directed. (FOR ICD-10 E10.9, E11.9). 1 each 0  . busPIRone (BUSPAR) 10 MG tablet TAKE 1 TABLET (10 MG TOTAL) BY MOUTH 2 (TWO) TIMES  DAILY. 60 tablet 11  . carvedilol (COREG) 6.25 MG tablet TAKE 1 TABLET (6.25 MG TOTAL) BY MOUTH 2 (TWO) TIMES DAILY. 30 tablet 11  . cetirizine (ZYRTEC) 10 MG tablet Take 1 tablet (10 mg total) by mouth daily. 90 tablet 3  . Continuous Blood Gluc Receiver (FREESTYLE LIBRE 14 DAY READER) DEVI 1 each by Does not apply route 3 (three) times daily between meals as needed. 1 each 1  . Continuous Blood Gluc Sensor (GUARDIAN SENSOR 3) MISC 1 Device by Does not apply route 3 (three) times daily between meals as needed. 5 each 6  . empagliflozin (JARDIANCE) 10 MG TABS tablet TAKE 1 TABLET (10 MG TOTAL) BY MOUTH DAILY BEFORE BREAKFAST. 90 tablet 3  . famotidine (PEPCID) 40 MG tablet TAKE 1 TABLET (40 MG TOTAL) BY MOUTH DAILY AS NEEDED FOR HEARTBURN OR INDIGESTION. 30 tablet 2  . ferrous sulfate 325 (65 FE) MG EC tablet Take 1  tablet (325 mg total) by mouth daily with breakfast. 90 tablet 0  . fluticasone (FLONASE) 50 MCG/ACT nasal spray Place 2 sprays into both nostrils daily. (Patient taking differently: Place 2 sprays into both nostrils daily as needed for allergies.) 16 g 6  . gabapentin (NEURONTIN) 300 MG capsule Take 1 capsule (300 mg total) by mouth 3 (three) times daily. 270 capsule 3  . glipiZIDE (GLUCOTROL) 10 MG tablet Take 1 tablet (10 mg total) by mouth 2 (two) times daily before a meal. 180 tablet 3  . insulin aspart (NOVOLOG) 100 UNIT/ML FlexPen INJECT 0-15 UNIT(S) INTO THE SKIN THREE TIMES DAILY WITH MEALS VIA SLIDING SCALE (Patient taking differently: Inject 0-15 Units into the skin See admin instructions. Tid with meals per sliding scale) 15 mL 12  . insulin NPH-regular Human (70-30) 100 UNIT/ML injection INJECT 38 UNITS INTO THE SKIN 2 (TWO) TIMES DAILY WITH A MEAL. 60 mL 12  . Insulin Pen Needle 31G X 5 MM MISC USE AS DIRECTED 100 each 12  . Insulin Syringe-Needle U-100 31G X 5/16" 0.5 ML MISC USE TO INJECT INSULIN TWICE DAILY. 100 each 11  . Magnesium Oxide 200 MG TABS Take 200 mg by mouth  daily.    . sacubitril-valsartan (ENTRESTO) 24-26 MG Take 1 tablet by mouth 2 (two) times daily.    [name] spironolactone (ALDACTONE) 25 MG tablet TAKE 0.5 TABLETS (12.5 MG TOTAL) BY MOUTH DAILY. 45 tablet 6  . torsemide (DEMADEX) 20 MG tablet TAKE 1 TABLET (20 MG TOTAL) BY MOUTH DAILY. 60 tablet 6  . traZODone (DESYREL) 100 MG tablet Take 1 tablet (100 mg total) by mouth at bedtime. For sleep 90 tablet 3  . Vitamin D, Ergocalciferol, (DRISDOL) 1.25 MG (50000 UNIT) CAPS capsule TAKE 1 CAPSULE (50,000 UNITS TOTAL) BY MOUTH ONCE A WEEK. (Patient taking differently: Take 50,000 Units by mouth every Monday.) 5 capsule 2   No current facility-administered medications for this encounter.   Vitals:   01/09/21 1343  BP: (!) 144/90  Pulse: 81  SpO2: 96%  Weight: (!) 150 kg (330 lb 12.8 oz)   Wt Readings from Last 3 Encounters:  01/09/21 (!) 150 kg (330 lb 12.8 oz)  12/31/20 (!) 150.6 kg (332 lb)  12/25/20 (!) 149.7 kg (330 lb)   Physical Exam General:  Well appearing. No resp difficulty HEENT: normal Neck: supple. no JVD. Carotids 2+ bilat; no bruits. No lymphadenopathy or thryomegaly appreciated. Cor: PMI nondisplaced. Regular rate & rhythm. No rubs, gallops or murmurs. Lungs: clear Abdomen: soft, nontender, nondistended. No hepatosplenomegaly. No bruits or masses. Good bowel sounds. Extremities: no cyanosis, clubbing, rash, edema Neuro: alert & orientedx3, cranial nerves grossly intact. moves all 4 extremities w/o difficulty. Affect pleasant  Assessment & Plan 1.  Chronic systolic congestive heart failure: s/p [employer] subcutaneous ICD implant 05/17/15 (gen change 8/21 - c/b traumatic intubation).  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  - Echo 12/31/17 LVEF 25-30%, Mod MR. RVSP 67 - Echo 9/21: EF 20-25% Personally reviewed -NYHA II. Volume status stable. Continue torsemide 20 mg daily.  - Continue  Jardiance 10 mg daily. - Continue torsemide 20 mg daily.  - Continue Entresto  97/103 mg bid. - Continue carvedilol 6.25 mg bid.  - Increase spiro 25 mg daily.  BMET in 7 days.  - Off Bidil due to hypotension.  2. Paroxysmal Afib - New diagnosis 06/2017.  - Continue Eliquis 5 mg bid.  No bleeding issues. - Regular on exam. Continue amio 100 daily.  -TSH  stable 10/2020  -LfTs ok (12/21). - will need yearly eye exams.   3. VT with ICD shock 12/29/17 - on amio 100 mg daily.  - No recent VT.  4. Hypertension  -Stable.   5. CKD stage IIIa in setting of solitary kidney (renal cell carcimona 2014) - Creatinine 1.5-1.7.  - Follows at [facility].  6. OSA:  - Continue CPAP.    7. Marijuana use: - Former tobacco smoker.  - Now smokes THC daily.   8. DM2:  - Stable. Per PCP. Recent HgBa1c ~6%  9. Morbid Obesity - Body mass index is 51.81 kg/m.  -Discussed portion control.   Refill eliquis, metformin, amiodarone, torsemide , and allopurinol.  BMET in 7 days.   Follow up  3-4 months     Obi Scrima, NP-C 01/09/21

## 2021-01-09 ENCOUNTER — Ambulatory Visit (HOSPITAL_COMMUNITY)
Admission: RE | Admit: 2021-01-09 | Discharge: 2021-01-09 | Disposition: A | Discharge status: Home / Self Care | Payer: Medicaid Other | Source: Ambulatory Visit | Attending: Adult Health | Admitting: Adult Health

## 2021-01-09 ENCOUNTER — Other Ambulatory Visit: Payer: Self-pay

## 2021-01-09 ENCOUNTER — Encounter (HOSPITAL_COMMUNITY): Payer: Self-pay

## 2021-01-09 VITALS — BP 144/90 | HR 81 | Wt 330.8 lb

## 2021-01-09 DIAGNOSIS — M109 Gout, unspecified: Secondary | ICD-10-CM | POA: Insufficient documentation

## 2021-01-09 DIAGNOSIS — I10 Essential (primary) hypertension: Secondary | ICD-10-CM

## 2021-01-09 DIAGNOSIS — Z79899 Other long term (current) drug therapy: Secondary | ICD-10-CM | POA: Insufficient documentation

## 2021-01-09 DIAGNOSIS — Z7984 Long term (current) use of oral hypoglycemic drugs: Secondary | ICD-10-CM | POA: Insufficient documentation

## 2021-01-09 DIAGNOSIS — R7303 Prediabetes: Secondary | ICD-10-CM

## 2021-01-09 DIAGNOSIS — Z6841 Body Mass Index (BMI) 40.0 and over, adult: Secondary | ICD-10-CM | POA: Insufficient documentation

## 2021-01-09 DIAGNOSIS — R739 Hyperglycemia, unspecified: Secondary | ICD-10-CM | POA: Diagnosis not present

## 2021-01-09 DIAGNOSIS — N1831 Chronic kidney disease, stage 3a: Secondary | ICD-10-CM | POA: Insufficient documentation

## 2021-01-09 DIAGNOSIS — Z85528 Personal history of other malignant neoplasm of kidney: Secondary | ICD-10-CM | POA: Diagnosis not present

## 2021-01-09 DIAGNOSIS — Z95810 Presence of automatic (implantable) cardiac defibrillator: Secondary | ICD-10-CM | POA: Diagnosis not present

## 2021-01-09 DIAGNOSIS — Z794 Long term (current) use of insulin: Secondary | ICD-10-CM | POA: Diagnosis not present

## 2021-01-09 DIAGNOSIS — Z7901 Long term (current) use of anticoagulants: Secondary | ICD-10-CM | POA: Diagnosis not present

## 2021-01-09 DIAGNOSIS — Z9989 Dependence on other enabling machines and devices: Secondary | ICD-10-CM | POA: Diagnosis not present

## 2021-01-09 DIAGNOSIS — I5022 Chronic systolic (congestive) heart failure: Secondary | ICD-10-CM | POA: Insufficient documentation

## 2021-01-09 DIAGNOSIS — I480 Paroxysmal atrial fibrillation: Secondary | ICD-10-CM | POA: Diagnosis not present

## 2021-01-09 DIAGNOSIS — I130 Hypertensive heart and chronic kidney disease with heart failure and stage 1 through stage 4 chronic kidney disease, or unspecified chronic kidney disease: Secondary | ICD-10-CM | POA: Diagnosis not present

## 2021-01-09 DIAGNOSIS — Z79890 Hormone replacement therapy (postmenopausal): Secondary | ICD-10-CM | POA: Insufficient documentation

## 2021-01-09 DIAGNOSIS — E1122 Type 2 diabetes mellitus with diabetic chronic kidney disease: Secondary | ICD-10-CM | POA: Diagnosis not present

## 2021-01-09 DIAGNOSIS — F1290 Cannabis use, unspecified, uncomplicated: Secondary | ICD-10-CM | POA: Diagnosis not present

## 2021-01-09 DIAGNOSIS — G4733 Obstructive sleep apnea (adult) (pediatric): Secondary | ICD-10-CM | POA: Insufficient documentation

## 2021-01-09 DIAGNOSIS — Z905 Acquired absence of kidney: Secondary | ICD-10-CM | POA: Insufficient documentation

## 2021-01-09 DIAGNOSIS — I472 Ventricular tachycardia: Secondary | ICD-10-CM | POA: Diagnosis not present

## 2021-01-09 DIAGNOSIS — E119 Type 2 diabetes mellitus without complications: Secondary | ICD-10-CM

## 2021-01-09 MED ORDER — AMIODARONE HCL 100 MG PO TABS
ORAL_TABLET | Freq: Every day | ORAL | 11 refills | Status: DC
Start: 2021-01-09 — End: 2021-05-22
  Filled 2021-01-09: qty 90, 90d supply, fill #0

## 2021-01-09 MED ORDER — TORSEMIDE 20 MG PO TABS
ORAL_TABLET | Freq: Every day | ORAL | 6 refills | Status: DC
  Filled 2021-01-09: qty 60, fill #0
  Filled 2021-01-23: qty 30, 30d supply, fill #0
  Filled 2021-03-04: qty 30, 30d supply, fill #1
  Filled 2021-03-07: qty 90, 90d supply, fill #1

## 2021-01-09 MED ORDER — APIXABAN 5 MG PO TABS
ORAL_TABLET | ORAL | 10 refills | Status: DC
  Filled 2021-01-09: qty 60, 30d supply, fill #0
  Filled 2021-04-16: qty 60, 30d supply, fill #1

## 2021-01-09 MED ORDER — METFORMIN HCL 1000 MG PO TABS
1000.0000 mg | ORAL_TABLET | Freq: Two times a day (BID) | ORAL | 3 refills | Status: DC
  Filled 2021-01-09: qty 180, 90d supply, fill #0

## 2021-01-09 MED ORDER — SPIRONOLACTONE 25 MG PO TABS
ORAL_TABLET | Freq: Every day | ORAL | 6 refills | Status: DC
  Filled 2021-01-09: qty 45, fill #0

## 2021-01-09 MED ORDER — SPIRONOLACTONE 25 MG PO TABS
25.0000 mg | ORAL_TABLET | Freq: Every day | ORAL | 6 refills | Status: DC
Start: 2021-01-09 — End: 2021-05-27
  Filled 2021-01-09: qty 90, 90d supply, fill #0
  Filled 2021-03-10: qty 30, 30d supply, fill #0
  Filled 2021-04-22: qty 30, 30d supply, fill #1

## 2021-01-09 MED ORDER — ALLOPURINOL 100 MG PO TABS
100.0000 mg | ORAL_TABLET | Freq: Every day | ORAL | 3 refills | Status: DC
  Filled 2021-01-09: qty 90, 90d supply, fill #0

## 2021-01-09 NOTE — Patient Instructions (Signed)
INCREASE Spironolactone to 25 mg, one tab daily  Labs needed in 7-10 days   Your physician recommends that you schedule a follow-up appointment in: 3-4 months with Dr.Bensimhon  Do the following things EVERYDAY: 1) Weigh yourself in the morning before breakfast. Write it down and keep it in a log. 2) Take your medicines as prescribed 3) Eat low salt foods--Limit salt (sodium) to 2000 mg per day.  4) Stay as active as you can everyday 5) Limit all fluids for the day to less than 2 liters At the [facility], you and your health needs are our priority. As part of our continuing mission to provide you with exceptional heart care, we have created designated Provider Care Teams. These Care Teams include your primary Cardiologist (physician) and Advanced Practice Providers (APPs- Physician Assistants and Nurse Practitioners) who all work together to provide you with the care you need, when you need it.   You may see any of the following providers on your designated Care Team at your next follow up: [name] Dr Glori Bickers . Dr Loralie Champagne . Dr Vickki Muff . Darrick Grinder, NP . Lyda Jester, [place] . Audry Riles, PharmD   Please be sure to bring in all your medications bottles to every appointment.   Please see our updated No Show and Same Day Appointment Cancellation Policy attached to your AVS.

## 2021-01-10 ENCOUNTER — Other Ambulatory Visit: Payer: Self-pay

## 2021-01-13 ENCOUNTER — Other Ambulatory Visit: Payer: Self-pay

## 2021-01-13 NOTE — Patient Instructions (Signed)
Visit Information  [name] was given information about Medicaid Managed Care team care coordination services as a part of their Healthy [facility] Medicaid benefit. Jeffrey [name] verbally consented to engagement with the [facility] Managed Care team.   For questions related to your Healthy [facility] health plan, please call: [phone] or visit the homepage here: [email or web address]  If you would like to schedule transportation through your Healthy [facility] plan, please call the following number at least 2 days in advance of your appointment: [phone]   Call the [place] at [phone], at any time, 24 hours a day, 7 days a week. If you are in danger or need immediate medical attention call 911.  [name] - following are the goals we discussed in your visit today:  Goals Addressed   None     Please see education materials related to DM provided as print materials.   Patient verbalizes understanding of instructions provided today.   The Managed Medicaid care management team will reach out to the patient again over the next 120 days.   Jeffrey [name], Pharm.D., Managed Medicaid Pharmacist [phone]   Following is a copy of your plan of care:  Patient Care Plan: General Plan of Care (Adult)    Problem Identified: Health Promotion or Disease Self-Management (General Plan of Care)   Priority: High  Onset Date: 09/24/2020  Note:   Current Barriers:  . Chronic Disease Management support and education needs . Update 11/28/20:  Patient recently discharged from hospital 11/26/20-follow up appointments scheduled.  Nurse Case Manager Clinical Goal(s):  [name] Over the next 90 days, patient will attend all scheduled medical appointments:   Interventions:  . Inter-disciplinary care team collaboration (see longitudinal plan of care) . Evaluation of current treatment plan and patient's adherence to plan as established by  provider.  [name] Update 11/28/20:  Continue monitoring BP daily and reporting readings outside of parameters. [name] Update 12/27/20:  Blood pressure 128/80. [name] Reviewed medications with patient. . Discussed plans with patient for ongoing care management follow up and provided patient with direct contact information for care management team . Provided patient with educational materials related to exercise. . Reviewed scheduled/upcoming provider appointments. [name] Update 11/28/20:  Discussed low glycemic vs high glycemic foods.  Pharmacy referral made for review of medications. . Pharmacy referral for medication review.  Patient Goals/Self-Care Activities Over the next 90 days, patient will:  -Attends all scheduled provider appointments Calls provider office for new concerns or questions  Follow Up Plan: The Managed Medicaid care management team will reach out to the patient again over the next 30 days.  The patient has been provided with contact information for the Managed Medicaid care management team and has been advised to call with any health related questions or concerns.       Patient Care Plan: Medication Management    Problem Identified: Health Promotion or Disease Self-Management (General Plan of Care)     Goal: Medication Management   Note:   Current Barriers:  . Does not maintain contact with provider office .   Pharmacist Clinical Goal(s):  [name] Over the next 120 days, patient will contact provider office for questions/concerns as evidenced notation of same in electronic health record through collaboration with PharmD and provider.  .   Interventions: . Inter-disciplinary care team collaboration (see longitudinal plan of care) . Comprehensive medication review performed; medication list updated in electronic medical record  @RXCPDIABETES @ @RXCPHYPERTENSION @ @RXCPHYPERLIPIDEMIA @  Patient Goals/Self-Care Activities . Over the  next 120 days, patient will:  - collaborate with  provider on medication access solutions  Follow Up Plan: The care management team will reach out to the patient again over the next 120 days.     Task: Mutually Develop and Royce Macadamia Achievement of Patient Goals   Note:   Care Management Activities:    - verbalization of feelings encouraged    Notes:

## 2021-01-13 NOTE — Patient Outreach (Signed)
Medicaid Managed Care    Pharmacy Note  01/13/2021 Name: Jeffrey [name] MRN: [id number] DOB: 1976-06-07  Jeffrey [name] is a 45 y.o. year old male who is a primary care patient of [name], FNP. The [facility] Managed Care Coordination team was consulted for assistance with disease management and care coordination needs.    Engaged with patient Engaged with patient by telephone for initial visit in response to referral for case management and/or care coordination services.  Mr. Route was given information about Managed Medicaid Care Coordination team services today. Jeffrey [name] agreed to services and verbal consent obtained.   Objective:  Lab Results  Component Value Date   CREATININE 1.57 (H) 12/18/2020   CREATININE 1.23 12/06/2020   CREATININE 1.92 (H) 11/26/2020    Lab Results  Component Value Date   HGBA1C 8.6 (H) 10/28/2020       Component Value Date/Time   CHOL 188 10/28/2020 0932   TRIG 255 (H) 10/28/2020 0932   HDL 37 (L) 10/28/2020 0932   CHOLHDL 5.1 (H) 10/28/2020 0932   CHOLHDL 6 03/31/2013 0849   VLDL 28.8 03/31/2013 0849   LDLCALC 107 (H) 10/28/2020 0932    Other: (TSH, CBC, Vit D, etc.)  Clinical ASCVD: Yes  The ASCVD Risk score Jeffrey Bussing DC Jr., et al., 2013) failed to calculate for the following reasons:   The patient has a prior MI or stroke diagnosis    Other: (CHADS2VASc if Afib, PHQ9 if depression, MMRC or CAT for COPD, ACT, DEXA)  BP Readings from Last 3 Encounters:  01/09/21 (!) 144/90  12/31/20 131/87  12/25/20 120/82    Assessment/Interventions: Review of patient past medical history, allergies, medications, health status, including review of consultants reports, laboratory and other test data, was performed as part of comprehensive evaluation and provision of chronic care management services.   Cardio Home BP: Tests 1-2x/week. Normally 120's/70's Amiodarone 148m QD Eliquis 546mBID Carvedilol 6.2543mID Entresto 24/26  BID Spironolactone 25m44m Torsemide 20mg65mPlan: At goal,  patient stable/ symptoms controlled   DM Home Readings: Never above 120 Metformin 1000mg 85mAspart NPH 70-30 Jardiance Plan: At goal,  patient stable/ symptoms controlled  Gout Allopurinol 100mg -5m attack was "couple months ago" Plan: At goal,  patient stable/ symptoms controlled     SDOH (Social Determinants of Health) assessments and interventions performed:    Care Plan  Allergies  Allergen Reactions  . Aspirin Shortness Of Breath, Itching and Rash     Burning sensation (Patient reports he tolerates other NSAIDS)   . Bee Venom Hives and Swelling  . Lisinopril Cough  . Tomato Rash    Medications Reviewed Today    Reviewed by Branch,Shonna ChockCertified Medical Assistant) on 01/09/21 at 1343  Med List Status: <None>  Medication Order Taking? Sig Documenting Provider Last Dose Status Informant  acetaminophen (TYLENOL) 325 MG tablet [id number] 2 tablets (650 mg total) by mouth every 4 (four) hours as needed for mild pain, moderate pain, fever or headache. TilleryShirley FriarTaking Active Self           Med Note (HALL, [place] Crane D   Fri Nov 22, 2020  2:01 AM)    allopurinol (ZYLOPRIM) 100 MG tablet [id number] 1 tablet (100 mg total) by mouth daily. Stroud,Azzie Glatteraking Active Self  amiodarone (PACERONE) 100 MG tablet [id number] 1 TABLET (100 MG TOTAL) BY MOUTH DAILY. Bensimhon, Daniel Shaune Pascal  Taking Active   apixaban (ELIQUIS) 5 MG TABS tablet [id number] Yes TAKE 1 TABLET (5 MG TOTAL) BY MOUTH 2 (TWO) TIMES DAILY. RESUME WITH EVENING DOSE ON SUNDAY Shirley Friar, PA-C Taking Active   blood glucose meter kit and supplies [id number] Yes Dispense based on patient and insurance preference. Use up to four times daily as directed. (FOR ICD-10 E10.9, E11.9). [name], FNP Taking Active Self  busPIRone (BUSPAR) 10 MG tablet [id number] Yes TAKE 1 TABLET (10  MG TOTAL) BY MOUTH 2 (TWO) TIMES DAILY. [name], FNP Taking Active   carvedilol (COREG) 6.25 MG tablet [id number] Yes TAKE 1 TABLET (6.25 MG TOTAL) BY MOUTH 2 (TWO) TIMES DAILY. Bensimhon, Shaune Pascal, MD Taking Active   cetirizine (ZYRTEC) 10 MG tablet [id number] Yes Take 1 tablet (10 mg total) by mouth daily. [name], FNP Taking Active Self  Continuous Blood Gluc Receiver (FREESTYLE LIBRE 14 DAY READER) DEVI [id number] Yes 1 each by Does not apply route 3 (three) times daily between meals as needed. [name], FNP Taking Active Self  Continuous Blood Gluc Sensor (GUARDIAN SENSOR 3) MISC [id number] Yes 1 Device by Does not apply route 3 (three) times daily between meals as needed. [name], FNP Taking Active Self  empagliflozin (JARDIANCE) 10 MG TABS tablet [id number] Yes TAKE 1 TABLET (10 MG TOTAL) BY MOUTH DAILY BEFORE BREAKFAST. [place], Boyd, FNP Taking Active   famotidine (PEPCID) 40 MG tablet [id number] Yes TAKE 1 TABLET (40 MG TOTAL) BY MOUTH DAILY AS NEEDED FOR HEARTBURN OR INDIGESTION. Gatha Mayer, MD Taking Active   ferrous sulfate 325 (65 FE) MG EC tablet [id number] Yes Take 1 tablet (325 mg total) by mouth daily with breakfast. Gatha Mayer, MD Taking Active   fluticasone [facility]) 50 MCG/ACT nasal spray [id number] Yes Place 2 sprays into both nostrils daily.  Patient taking differently: Place 2 sprays into both nostrils daily as needed for allergies.   [name], FNP Taking Active            Med Note Dimitri Ped, AMANDA L   Fri Dec 06, 2020 12:07 PM)    gabapentin (NEURONTIN) 300 MG capsule [id number] Yes Take 1 capsule (300 mg total) by mouth 3 (three) times daily. [name], FNP Taking Active Self  glipiZIDE (GLUCOTROL) 10 MG tablet [id number] Yes Take 1 tablet (10 mg total) by mouth 2 (two) times daily before a meal. [name], FNP Taking Active Self  insulin aspart (NOVOLOG) 100 UNIT/ML FlexPen [id number] Yes INJECT 0-15 UNIT(S)  INTO THE SKIN THREE TIMES DAILY WITH MEALS VIA SLIDING SCALE  Patient taking differently: Inject 0-15 Units into the skin See admin instructions. Tid with meals per sliding scale   [name], FNP Taking Active   insulin NPH-regular Human (70-30) 100 UNIT/ML injection [id number] Yes INJECT 38 UNITS INTO THE SKIN 2 (TWO) TIMES DAILY WITH A MEAL. [name], FNP Taking Active   Insulin Pen Needle 31G X 5 MM MISC [id number] Yes USE AS DIRECTED [name], FNP Taking Active   Insulin Syringe-Needle U-100 31G X 5/16" 0.5 ML MISC [id number] Yes USE TO INJECT INSULIN TWICE DAILY. [name], FNP Taking Active   Magnesium Oxide 200 MG TABS [id number] Yes Take 200 mg by mouth daily. [provider] Taking Active Self  sacubitril-valsartan (ENTRESTO) 24-26 MG [id number] Yes Take 1 tablet by mouth 2 (two) times daily. [provider] Taking Active   spironolactone (ALDACTONE)  25 MG tablet [id number] Yes TAKE 0.5 TABLETS (12.5 MG TOTAL) BY MOUTH DAILY. [name], FNP Taking Active   torsemide (DEMADEX) 20 MG tablet [id number] Yes TAKE 1 TABLET (20 MG TOTAL) BY MOUTH DAILY. Mckinley Jewel, MD Taking Active   traZODone (DESYREL) 100 MG tablet [id number] Yes Take 1 tablet (100 mg total) by mouth at bedtime. For sleep [name], FNP Taking Active Self  Vitamin D, Ergocalciferol, (DRISDOL) 1.25 MG (50000 UNIT) CAPS capsule [id number] Yes TAKE 1 CAPSULE (50,000 UNITS TOTAL) BY MOUTH ONCE A WEEK.  Patient taking differently: Take 50,000 Units by mouth every Monday.   [name], FNP Taking Active Self          Patient Active Problem List   Diagnosis Date Noted  . Iron deficiency 12/25/2020  . Long term current use of anticoagulant - apixaban 12/25/2020  . Paroxysmal atrial fibrillation (HCC)   . Lactic acidosis 11/22/2020  . Hypotension 11/22/2020  . Aspiration into airway   . ICD (implantable cardioverter-defibrillator) battery depletion  05/15/2020  . Endotracheal tube present   . Respiratory failure ([place])   . Acute encephalopathy   . Acute pulmonary edema (HCC)   . NICM (nonischemic cardiomyopathy) ([place]) 04/24/2020  . S/P vasectomy 12/20/2019  . Anxiety 12/20/2019  . Hemoglobin A1C between 7% and 9% indicating borderline diabetic control 12/20/2019  . Seasonal allergies 12/20/2019  . Insomnia 12/20/2019  . Vasectomy evaluation 06/20/2019  . Visual problems 06/20/2019  . Blurry vision, bilateral 06/20/2019  . Hyperglycemia 02/13/2019  . Hyperosmolar (nonketotic) coma ([place]) 01/19/2019  . AICD (automatic cardioverter/defibrillator) present 01/19/2019  . Hyperlipidemia 12/07/2018  . Follow-up exam 11/22/2018  . Class 3 severe obesity due to excess calories with serious comorbidity and body mass index (BMI) of 45.0 to 49.9 in adult ([place]) 11/22/2018  . Dizziness 11/22/2018  . Lingular pneumonia 08/04/2018  . Ventricular tachycardia ([place]) 12/30/2017  . PAF (paroxysmal atrial fibrillation) ([place])   . Dilated cardiomyopathy ([place])   . Pollen allergy 02/17/2017  . Dyslipidemia 02/17/2017  . Hematochezia 11/08/2015  . CKD (chronic kidney disease), stage III ([place]) 06/20/2015  . Cardiac defibrillator in situ   . Chronic systolic CHF (congestive heart failure) ([place]) 03/11/2015  . Gouty arthritis 03/11/2015  . Chest pain 12/28/2014  . AKI (acute kidney injury) ([place]) 04/08/2014  . Aspirin allergy 04/08/2014  . Renal cell carcinoma ([place])   . Gout attack 11/10/2011  . Morbid obesity ([employer]) 10/23/2011  . OSA (obstructive sleep apnea) 10/23/2011  . Type 2 diabetes mellitus with stage 3 chronic kidney disease, with long-term current use of insulin ([place]) 10/22/2011  . Hyperlipidemia associated with type 2 diabetes mellitus ([place]) 12/25/2010  . Hypertension associated with diabetes ([place]) 12/25/2010    Conditions to be addressed/monitored: Atrial Fibrillation, HTN, Hypertriglyceridemia and DM  Care Plan : Medication Management   Updates made by Lane Hacker, [place] since 01/13/2021 12:00 AM    Problem: Health Promotion or Disease Self-Management (General Plan of Care)     Goal: Medication Management   Note:   Current Barriers:  . Does not maintain contact with provider office .   Pharmacist Clinical Goal(s):  [name] Over the next 120 days, patient will contact provider office for questions/concerns as evidenced notation of same in electronic health record through collaboration with PharmD and provider.  .   Interventions: . Inter-disciplinary care team collaboration (see longitudinal plan of care) . Comprehensive medication review performed; medication list updated in electronic medical record  _0 @ @  RXCPHYPERTENSION@ _0 @  Patient Goals/Self-Care Activities . Over the next 120 days, patient will:  - collaborate with provider on medication access solutions  Follow Up Plan: The care management team will reach out to the patient again over the next 120 days.     Task: Mutually Develop and Royce Macadamia Achievement of Patient Goals   Note:   Care Management Activities:    - verbalization of feelings encouraged    Notes:      Medication Assistance: None required. Patient affirms current coverage meets needs.   Follow up: Agree  Plan: The care management team will reach out to the patient again over the next 120 days.   [place] Constable, Pharm.D., Managed Medicaid Pharmacist - [phone]

## 2021-01-16 ENCOUNTER — Ambulatory Visit (HOSPITAL_COMMUNITY)
Admission: RE | Admit: 2021-01-16 | Discharge: 2021-01-16 | Disposition: A | Discharge status: Home / Self Care | Payer: Medicaid Other | Source: Ambulatory Visit | Attending: Cardiology | Admitting: Cardiology

## 2021-01-16 ENCOUNTER — Other Ambulatory Visit: Payer: Self-pay

## 2021-01-16 DIAGNOSIS — I5022 Chronic systolic (congestive) heart failure: Secondary | ICD-10-CM | POA: Diagnosis not present

## 2021-01-16 LAB — BASIC METABOLIC PANEL
Anion gap: 8 (ref 5–15)
BUN: 29 mg/dL — ABNORMAL HIGH (ref 6–20)
CO2: 23 mmol/L (ref 22–32)
Calcium: 9.8 mg/dL (ref 8.9–10.3)
Chloride: 104 mmol/L (ref 98–111)
Creatinine, Ser: 1.49 mg/dL — ABNORMAL HIGH (ref 0.61–1.24)
GFR, Estimated: 58 mL/min — ABNORMAL LOW (ref >60)
Glucose, Bld: 209 mg/dL — ABNORMAL HIGH (ref 70–99)
Potassium: 4.5 mmol/L (ref 3.5–5.1)
Sodium: 135 mmol/L (ref 135–145)

## 2021-01-22 ENCOUNTER — Other Ambulatory Visit: Payer: Self-pay | Admitting: Obstetrics and Gynecology

## 2021-01-22 ENCOUNTER — Other Ambulatory Visit: Payer: Self-pay

## 2021-01-22 NOTE — Patient Instructions (Signed)
Hi Mr. Balogh, thank you for speaking with me today-I am glad you are doing well.  Mr. Vandervliet was given information about Medicaid Managed Care team care coordination services as a part of their Healthy [facility] Medicaid benefit. [name] verbally consented to engagement with the [facility] Managed Care team.   For questions related to your Healthy [facility] health plan, please call: [phone] or visit the homepage here: [email or web address]  If you would like to schedule transportation through your Healthy [facility] plan, please call the following number at least 2 days in advance of your appointment: [phone]   Call the [place] at [phone], at any time, 24 hours a day, 7 days a week. If you are in danger or need immediate medical attention call 911.  Mr. Murri - following are the goals we discussed in your visit today:  Goals Addressed            This Visit's Progress   . Protect My Health       Timeframe:  Long-Range Goal Priority:  High Start Date:    09/24/20                         Expected End Date:   02/21/21              - schedule recommended health tests  - schedule and keep appointment for annual check-up  Update 11/28/20:Patient recently discharged from hospital 11/26/20-follow up appointments scheduled. Update 12/27/20:  Patient with no complaints today-scheduled for colonoscopy 02/05/21-rectal bleeding. Update 01/22/21:  Patient attending all scheduled appointments.  Annual check up completed 12/31/20 with Ms. Clovis Riley, NP.     Patient verbalizes understanding of instructions provided today.   The Managed Medicaid care management team will reach out to the patient again over the next 30 days.  The patient has been provided with contact information for the Managed Medicaid care management team and has been advised to call with any health related questions or concerns.   [name] RN, BSN [facility]   [facility] Curator - Managed Medicaid High Risk [phone].  Following is a copy of your plan of care:  Patient Care Plan: General Plan of Care (Adult)    Problem Identified: Health Promotion or Disease Self-Management (General Plan of Care)   Priority: High  Onset Date: 09/24/2020  Note:   Current Barriers:  . Chronic Disease Management support and education needs . Update 11/28/20:  Patient recently discharged from hospital 11/26/20-follow up appointments scheduled. [name] Update 01/22/21:  Patient has attended all hospital follow up appointments.  Nurse Case Manager Clinical Goal(s):  [name] Over the next 90 days, patient will attend all scheduled medical appointments:   Interventions:  . Inter-disciplinary care team collaboration (see longitudinal plan of care) . Evaluation of current treatment plan and patient's adherence to plan as established by provider.  [name] Update 11/28/20:  Continue monitoring BP daily and reporting readings outside of parameters. [name] Update 12/27/20:  Blood pressure 128/80. [name] Reviewed medications with patient. . Discussed plans with patient for ongoing care management follow up and provided patient with direct contact information for care management team . Provided patient with educational materials related to exercise. . Reviewed scheduled/upcoming provider appointments. [name] Update 11/28/20:  Discussed low glycemic vs high glycemic foods.  Pharmacy referral made for review of medications. . Pharmacy referral for medication review. [name] Update 01/22/21:  Pharmacy review completed 01/13/21, follow up 05/15/21.  Patient Goals/Self-Care Activities Over the next 90 days, patient will:  -Attends all scheduled provider appointments Calls provider office for new concerns or questions  Follow Up Plan: The Managed Medicaid care management team will reach out to the patient again over the next 30 days.  The patient has been provided with contact  information for the Managed Medicaid care management team and has been advised to call with any health related questions or concerns.

## 2021-01-22 NOTE — Patient Outreach (Signed)
Medicaid Managed Care   Nurse Care Manager Note  01/22/2021 Name:  Jeffrey [name] MRN:  [id number] DOB:  01/12/1976  Jeffrey [name] is an 45 y.o. year old male who is a primary patient of Jeffrey Glatter, FNP.  The [facility] Managed Care Coordination team was consulted for assistance with:    chronic healthcare management needs.  Jeffrey [name] was given information about Medicaid Managed Care Coordination team services today. Jeffrey [name] agreed to services and verbal consent obtained.  Engaged with patient by telephone for follow up visit in response to provider referral for case management and/or care coordination services.   Assessments/Interventions:  Review of past medical history, allergies, medications, health status, including review of consultants reports, laboratory and other test data, was performed as part of comprehensive evaluation and provision of chronic care management services.  SDOH (Social Determinants of Health) assessments and interventions performed:   Care Plan  Allergies  Allergen Reactions  . Aspirin Shortness Of Breath, Itching and Rash     Burning sensation (Patient reports he tolerates other NSAIDS)   . Bee Venom Hives and Swelling  . Lisinopril Cough  . Tomato Rash    Medications Reviewed Today    Reviewed by Jeffrey Medicus, RN (Registered Nurse) on 01/22/21 at [place] List Status: <None>  Medication Order Taking? Sig Documenting Provider Last Dose Status Informant  acetaminophen (TYLENOL) 325 MG tablet [id number] No Take 2 tablets (650 mg total) by mouth every 4 (four) hours as needed for mild pain, moderate pain, fever or headache. Jeffrey Friar, PA-C Taking Active Self           Med Note Jeffrey [name], MISTY [name]   Fri Nov 22, 2020  2:01 AM)    allopurinol (ZYLOPRIM) 100 MG tablet [id number]  Take 1 tablet (100 mg total) by mouth daily. Jeffrey [name] D, NP  Active   amiodarone (PACERONE) 100 MG tablet [id number]  TAKE 1 TABLET (100 MG TOTAL) BY MOUTH DAILY.  [name], Jeffrey D, NP  Active   apixaban (ELIQUIS) 5 MG TABS tablet [id number]  TAKE 1 TABLET (5 MG TOTAL) BY MOUTH 2 (TWO) TIMES DAILY. RESUME WITH EVENING DOSE ON Jeffrey [name], Jeffrey D, NP  Active   blood glucose meter kit and supplies [id number] No Dispense based on patient and insurance preference. Use up to four times daily as directed. (FOR ICD-10 E10.9, E11.9). Jeffrey Glatter, FNP Taking Active Self  busPIRone (BUSPAR) 10 MG tablet [id number] No TAKE 1 TABLET (10 MG TOTAL) BY MOUTH 2 (TWO) TIMES DAILY. Jeffrey Glatter, FNP Taking Active   carvedilol (COREG) 6.25 MG tablet [id number] No TAKE 1 TABLET (6.25 MG TOTAL) BY MOUTH 2 (TWO) TIMES DAILY. [name], Jeffrey Pascal, MD Taking Active   cetirizine (ZYRTEC) 10 MG tablet [id number] No Take 1 tablet (10 mg total) by mouth daily. Jeffrey Glatter, FNP Taking Active Self        Discontinued 11/26/20 1333 (Stop Taking at Discharge)   Continuous Blood Gluc Receiver (FREESTYLE LIBRE 14 DAY READER) DEVI [id number] No 1 each by Does not apply route 3 (three) times daily between meals as needed. Jeffrey Glatter, FNP Taking Active Self  Continuous Blood Gluc Sensor (GUARDIAN SENSOR 3) MISC [id number] No 1 Device by Does not apply route 3 (three) times daily between meals as needed. Jeffrey Glatter, FNP Taking Active Self  empagliflozin (JARDIANCE) 10 MG TABS tablet [id number] No TAKE 1 TABLET (10 MG TOTAL) BY MOUTH DAILY BEFORE BREAKFAST.  Jeffrey [name], [place], FNP Taking Active   famotidine (PEPCID) 40 MG tablet [id number] No TAKE 1 TABLET (40 MG TOTAL) BY MOUTH DAILY AS NEEDED FOR HEARTBURN OR INDIGESTION. Jeffrey Mayer, MD Taking Active   ferrous sulfate 325 (65 FE) MG EC tablet [id number] No Take 1 tablet (325 mg total) by mouth daily with breakfast. Jeffrey Mayer, MD Taking Active   fluticasone [facility]) 50 MCG/ACT nasal spray [id number] No Place 2 sprays into both nostrils daily.  Patient taking differently: Place 2 sprays into both nostrils daily as needed for  allergies.   Jeffrey Glatter, FNP Taking Active            Med Note Jeffrey [name], AMANDA L   Fri Dec 06, 2020 12:07 PM)    gabapentin (NEURONTIN) 300 MG capsule [id number] No Take 1 capsule (300 mg total) by mouth 3 (three) times daily. Jeffrey Glatter, FNP Taking Active Self  glipiZIDE (GLUCOTROL) 10 MG tablet [id number] No Take 1 tablet (10 mg total) by mouth 2 (two) times daily before a meal. Jeffrey Glatter, FNP Taking Active Self  insulin aspart (NOVOLOG) 100 UNIT/ML FlexPen [id number] No INJECT 0-15 UNIT(S) INTO THE SKIN THREE TIMES DAILY WITH MEALS VIA SLIDING SCALE  Patient taking differently: Inject 0-15 Units into the skin See admin instructions. Tid with meals per sliding scale   Jeffrey Glatter, FNP Taking Active   insulin NPH-regular Human (70-30) 100 UNIT/ML injection [id number] No INJECT 38 UNITS INTO THE SKIN 2 (TWO) TIMES DAILY WITH A MEAL. Jeffrey Glatter, FNP Taking Active   Insulin Pen Needle 31G X 5 MM MISC [id number] No USE AS DIRECTED Jeffrey Glatter, FNP Taking Active   Insulin Syringe-Needle U-100 31G X 5/16" 0.5 ML MISC [id number] No USE TO INJECT INSULIN TWICE DAILY. Jeffrey Glatter, FNP Taking Active   Magnesium Oxide 200 MG TABS [id number] No Take 200 mg by mouth daily. [provider] Taking Active Self  metFORMIN (GLUCOPHAGE) 1000 MG tablet [id number]  Take 1 tablet (1,000 mg total) by mouth 2 (two) times daily with a meal. [name], Jeffrey D, NP  Active   sacubitril-valsartan (ENTRESTO) 24-26 MG [id number] No Take 1 tablet by mouth 2 (two) times daily. [provider] Taking Active   spironolactone (ALDACTONE) 25 MG tablet [id number]  Take 1 tablet (25 mg total) by mouth daily. Jeffrey [name] D, NP  Active   torsemide (DEMADEX) 20 MG tablet [id number]  TAKE 1 TABLET (20 MG TOTAL) BY MOUTH DAILY. Jeffrey [name] D, NP  Active   traZODone (DESYREL) 100 MG tablet [id number] No Take 1 tablet (100 mg total) by mouth at bedtime. For sleep Jeffrey Glatter, FNP Taking Active  Self  Vitamin [name], Ergocalciferol, (DRISDOL) 1.25 MG (50000 UNIT) CAPS capsule [id number] No TAKE 1 CAPSULE (50,000 UNITS TOTAL) BY MOUTH ONCE A WEEK.  Patient taking differently: Take 50,000 Units by mouth every Monday.   Jeffrey Glatter, FNP Taking Active Self          Patient Active Problem List   Diagnosis Date Noted  . Iron deficiency 12/25/2020  . Long term current use of anticoagulant - apixaban 12/25/2020  . Paroxysmal atrial fibrillation (HCC)   . Lactic acidosis 11/22/2020  . Hypotension 11/22/2020  . Aspiration into airway   . ICD (implantable cardioverter-defibrillator) battery depletion 05/15/2020  . Endotracheal tube present   . Respiratory failure ([place])   . Acute encephalopathy   . Acute pulmonary edema (HCC)   . NICM (nonischemic  cardiomyopathy) ([place]) 04/24/2020  . S/P vasectomy 12/20/2019  . Anxiety 12/20/2019  . Hemoglobin A1C between 7% and 9% indicating borderline diabetic control 12/20/2019  . Seasonal allergies 12/20/2019  . Insomnia 12/20/2019  . Vasectomy evaluation 06/20/2019  . Visual problems 06/20/2019  . Blurry vision, bilateral 06/20/2019  . Hyperglycemia 02/13/2019  . Hyperosmolar (nonketotic) coma ([place]) 01/19/2019  . AICD (automatic cardioverter/defibrillator) present 01/19/2019  . Hyperlipidemia 12/07/2018  . Follow-up exam 11/22/2018  . Class 3 severe obesity due to excess calories with serious comorbidity and body mass index (BMI) of 45.0 to 49.9 in adult ([place]) 11/22/2018  . Dizziness 11/22/2018  . Lingular pneumonia 08/04/2018  . Ventricular tachycardia ([place]) 12/30/2017  . PAF (paroxysmal atrial fibrillation) ([place])   . Dilated cardiomyopathy ([place])   . Pollen allergy 02/17/2017  . Dyslipidemia 02/17/2017  . Hematochezia 11/08/2015  . CKD (chronic kidney disease), stage III ([place]) 06/20/2015  . Cardiac defibrillator in situ   . Chronic systolic CHF (congestive heart failure) ([place]) 03/11/2015  . Gouty arthritis 03/11/2015  . Chest pain  12/28/2014  . AKI (acute kidney injury) ([place]) 04/08/2014  . Aspirin allergy 04/08/2014  . Renal cell carcinoma ([place])   . Gout attack 11/10/2011  . Morbid obesity ([place]) 10/23/2011  . OSA (obstructive sleep apnea) 10/23/2011  . Type 2 diabetes mellitus with stage 3 chronic kidney disease, with long-term current use of insulin ([place]) 10/22/2011  . Hyperlipidemia associated with type 2 diabetes mellitus ([place]) 12/25/2010  . Hypertension associated with diabetes ([place]) 12/25/2010    Conditions to be addressed/monitored per PCP order:  chronic healthcare management needs, history of  poorly controlled HTN, R renal cell carcinoma s/p nephrectomy, CKD IIIa, DM2, OSA, gout, morbid obesity and systolic HF due to NICM.   Care Plan : General Plan of Care (Adult)  Updates made by Jeffrey Medicus, RN since 01/22/2021 12:00 AM    Problem: Health Promotion or Disease Self-Management (General Plan of Care)   Priority: High  Onset Date: 09/24/2020  Note:   Current Barriers:  . Chronic Disease Management support and education needs . Update 11/28/20:  Patient recently discharged from hospital 11/26/20-follow up appointments scheduled.  Nurse Case Manager Clinical Goal(s):  [name] Over the next 90 days, patient will attend all scheduled medical appointments:   Interventions:  . Inter-disciplinary care team collaboration (see longitudinal plan of care) . Evaluation of current treatment plan and patient's adherence to plan as established by provider.  [name] Update 11/28/20:  Continue monitoring BP daily and reporting readings outside of parameters. [name] Update 12/27/20:  Blood pressure 128/80. [name] Reviewed medications with patient. . Discussed plans with patient for ongoing care management follow up and provided patient with direct contact information for care management team . Provided patient with educational materials related to exercise. . Reviewed scheduled/upcoming provider appointments. [name] Update 11/28/20:  Discussed  low glycemic vs high glycemic foods.  Pharmacy referral made for review of medications. . Pharmacy referral for medication review. [name] Update 01/22/21:  Pharmacy review completed 01/13/21, follow up 05/15/21.  Patient Goals/Self-Care Activities Over the next 90 days, patient will:  -Attends all scheduled provider appointments Calls provider office for new concerns or questions  Follow Up Plan: The Managed Medicaid care management team will reach out to the patient again over the next 30 days.  The patient has been provided with contact information for the Managed Medicaid care management team and has been advised to call with any health related questions or concerns.  Follow Up:  Patient agrees to Care Plan and Follow-up.  Plan: The Managed Medicaid care management team will reach out to the patient again over the next 30 days. and The patient has been provided with contact information for the Managed Medicaid care management team and has been advised to call with any health related questions or concerns.  Date/time of next scheduled RN care management/care coordination outreach:  02/21/21 at 1030.

## 2021-01-23 ENCOUNTER — Other Ambulatory Visit (HOSPITAL_COMMUNITY): Payer: Self-pay | Admitting: Internal Medicine

## 2021-01-23 ENCOUNTER — Other Ambulatory Visit: Payer: Self-pay

## 2021-01-23 MED FILL — Sacubitril-Valsartan Tab 49-51 MG: ORAL | 30 days supply | Qty: 60 | Fill #0 | Status: CN

## 2021-01-24 ENCOUNTER — Other Ambulatory Visit (HOSPITAL_COMMUNITY): Payer: Self-pay | Admitting: *Deleted

## 2021-01-24 ENCOUNTER — Other Ambulatory Visit: Payer: Self-pay

## 2021-01-24 ENCOUNTER — Other Ambulatory Visit: Payer: Self-pay | Admitting: Family Medicine

## 2021-01-24 DIAGNOSIS — E1122 Type 2 diabetes mellitus with diabetic chronic kidney disease: Secondary | ICD-10-CM

## 2021-01-24 MED ORDER — SACUBITRIL-VALSARTAN 49-51 MG PO TABS
1.0000 | ORAL_TABLET | Freq: Two times a day (BID) | ORAL | 6 refills | Status: DC
  Filled 2021-01-24 – 2021-02-06 (×3): qty 60, 30d supply, fill #0
  Filled 2021-04-16: qty 60, 30d supply, fill #1

## 2021-01-24 MED FILL — Carvedilol Tab 6.25 MG: ORAL | 30 days supply | Qty: 30 | Fill #0 | Status: AC

## 2021-01-27 ENCOUNTER — Other Ambulatory Visit: Payer: Self-pay | Admitting: Nurse Practitioner

## 2021-01-27 ENCOUNTER — Other Ambulatory Visit (HOSPITAL_COMMUNITY): Payer: Self-pay

## 2021-01-27 ENCOUNTER — Other Ambulatory Visit: Payer: Self-pay

## 2021-01-27 DIAGNOSIS — E1122 Type 2 diabetes mellitus with diabetic chronic kidney disease: Secondary | ICD-10-CM

## 2021-01-27 DIAGNOSIS — N183 Chronic kidney disease, stage 3 unspecified: Secondary | ICD-10-CM

## 2021-01-27 MED ORDER — BLOOD GLUCOSE METER KIT
PACK | 0 refills | Status: DC
  Filled 2021-01-27: qty 1, fill #0
  Filled 2021-01-29: qty 1, 30d supply, fill #0
  Filled 2021-01-29: qty 1, fill #0

## 2021-01-28 ENCOUNTER — Telehealth (HOSPITAL_COMMUNITY): Payer: Self-pay | Admitting: Pharmacy Technician

## 2021-01-28 ENCOUNTER — Other Ambulatory Visit (HOSPITAL_COMMUNITY): Payer: Self-pay

## 2021-01-28 NOTE — Telephone Encounter (Signed)
Patient Advocate Encounter   Received notification from [place] [employer] that prior authorization for Jeffrey [name] is required.   PA submitted on CoverMyMeds Key [id number] Status is pending   Will continue to follow.

## 2021-01-28 NOTE — Telephone Encounter (Signed)
Advanced Heart Failure Patient Advocate Encounter  Prior Authorization for Delene Loll has been approved.    PA#  [id number] Effective dates: 01/27/21 through 01/28/22  Charlann Boxer, CPhT

## 2021-01-29 ENCOUNTER — Other Ambulatory Visit: Payer: Self-pay

## 2021-01-29 MED ORDER — ACCU-CHEK FASTCLIX LANCETS MISC
Freq: Four times a day (QID) | 11 refills | Status: DC
  Filled 2021-01-29: qty 102, 25d supply, fill #0

## 2021-01-30 ENCOUNTER — Other Ambulatory Visit: Payer: Self-pay

## 2021-02-03 ENCOUNTER — Other Ambulatory Visit: Payer: Self-pay | Admitting: Internal Medicine

## 2021-02-05 ENCOUNTER — Other Ambulatory Visit: Payer: Self-pay

## 2021-02-06 ENCOUNTER — Other Ambulatory Visit: Payer: Self-pay

## 2021-02-06 ENCOUNTER — Other Ambulatory Visit (HOSPITAL_COMMUNITY)
Admission: RE | Admit: 2021-02-06 | Discharge: 2021-02-06 | Disposition: A | Discharge status: Home / Self Care | Payer: Medicaid Other | Source: Ambulatory Visit | Attending: Internal Medicine | Admitting: Internal Medicine

## 2021-02-06 DIAGNOSIS — Z01812 Encounter for preprocedural laboratory examination: Secondary | ICD-10-CM | POA: Diagnosis not present

## 2021-02-06 DIAGNOSIS — Z20822 Contact with and (suspected) exposure to covid-19: Secondary | ICD-10-CM | POA: Diagnosis not present

## 2021-02-07 ENCOUNTER — Other Ambulatory Visit: Payer: Self-pay

## 2021-02-07 LAB — SARS CORONAVIRUS 2 (TAT 6-24 HRS): SARS Coronavirus 2: NEGATIVE

## 2021-02-10 ENCOUNTER — Encounter (HOSPITAL_COMMUNITY)
Admission: RE | Disposition: A | Discharge status: Home / Self Care | Payer: Self-pay | Source: Home / Self Care | Attending: Internal Medicine

## 2021-02-10 ENCOUNTER — Ambulatory Visit (HOSPITAL_COMMUNITY): Payer: Medicaid Other | Admitting: Certified Registered Nurse Anesthetist

## 2021-02-10 ENCOUNTER — Other Ambulatory Visit: Payer: Self-pay

## 2021-02-10 ENCOUNTER — Ambulatory Visit (HOSPITAL_COMMUNITY)
Admission: RE | Admit: 2021-02-10 | Discharge: 2021-02-10 | Disposition: A | Discharge status: Home / Self Care | Payer: Medicaid Other | Attending: Internal Medicine | Admitting: Internal Medicine

## 2021-02-10 ENCOUNTER — Encounter (HOSPITAL_COMMUNITY): Payer: Self-pay | Admitting: Internal Medicine

## 2021-02-10 DIAGNOSIS — K625 Hemorrhage of anus and rectum: Secondary | ICD-10-CM

## 2021-02-10 DIAGNOSIS — Z7901 Long term (current) use of anticoagulants: Secondary | ICD-10-CM | POA: Diagnosis not present

## 2021-02-10 DIAGNOSIS — K3189 Other diseases of stomach and duodenum: Secondary | ICD-10-CM

## 2021-02-10 DIAGNOSIS — Z79899 Other long term (current) drug therapy: Secondary | ICD-10-CM | POA: Insufficient documentation

## 2021-02-10 DIAGNOSIS — Z888 Allergy status to other drugs, medicaments and biological substances status: Secondary | ICD-10-CM | POA: Insufficient documentation

## 2021-02-10 DIAGNOSIS — Z905 Acquired absence of kidney: Secondary | ICD-10-CM | POA: Diagnosis not present

## 2021-02-10 DIAGNOSIS — K2950 Unspecified chronic gastritis without bleeding: Secondary | ICD-10-CM | POA: Insufficient documentation

## 2021-02-10 DIAGNOSIS — Z886 Allergy status to analgesic agent status: Secondary | ICD-10-CM | POA: Diagnosis not present

## 2021-02-10 DIAGNOSIS — Z95810 Presence of automatic (implantable) cardiac defibrillator: Secondary | ICD-10-CM | POA: Insufficient documentation

## 2021-02-10 DIAGNOSIS — K648 Other hemorrhoids: Secondary | ICD-10-CM | POA: Diagnosis not present

## 2021-02-10 DIAGNOSIS — Z7984 Long term (current) use of oral hypoglycemic drugs: Secondary | ICD-10-CM | POA: Insufficient documentation

## 2021-02-10 DIAGNOSIS — K644 Residual hemorrhoidal skin tags: Secondary | ICD-10-CM | POA: Insufficient documentation

## 2021-02-10 DIAGNOSIS — D509 Iron deficiency anemia, unspecified: Secondary | ICD-10-CM | POA: Insufficient documentation

## 2021-02-10 DIAGNOSIS — D649 Anemia, unspecified: Secondary | ICD-10-CM

## 2021-02-10 DIAGNOSIS — Z794 Long term (current) use of insulin: Secondary | ICD-10-CM | POA: Insufficient documentation

## 2021-02-10 DIAGNOSIS — Z6841 Body Mass Index (BMI) 40.0 and over, adult: Secondary | ICD-10-CM | POA: Diagnosis not present

## 2021-02-10 DIAGNOSIS — F17210 Nicotine dependence, cigarettes, uncomplicated: Secondary | ICD-10-CM | POA: Diagnosis not present

## 2021-02-10 DIAGNOSIS — K5730 Diverticulosis of large intestine without perforation or abscess without bleeding: Secondary | ICD-10-CM | POA: Diagnosis not present

## 2021-02-10 HISTORY — PX: ESOPHAGOGASTRODUODENOSCOPY (EGD) WITH PROPOFOL: SHX5813

## 2021-02-10 HISTORY — PX: BIOPSY: SHX5522

## 2021-02-10 HISTORY — PX: COLONOSCOPY WITH PROPOFOL: SHX5780

## 2021-02-10 LAB — GLUCOSE, CAPILLARY: Glucose-Capillary: 207 mg/dL — ABNORMAL HIGH (ref 70–99)

## 2021-02-10 SURGERY — COLONOSCOPY WITH PROPOFOL
Anesthesia: Monitor Anesthesia Care

## 2021-02-10 MED ORDER — ETOMIDATE 2 MG/ML IV SOLN
INTRAVENOUS | Status: DC | PRN
  Administered 2021-02-10: 10 mg via INTRAVENOUS
  Administered 2021-02-10 (×3): 2 mg via INTRAVENOUS

## 2021-02-10 MED ORDER — PROPOFOL 1000 MG/100ML IV EMUL
INTRAVENOUS | Status: AC
  Filled 2021-02-10: qty 200

## 2021-02-10 MED ORDER — PROPOFOL 500 MG/50ML IV EMUL
INTRAVENOUS | Status: DC | PRN
  Administered 2021-02-10: 100 ug/kg/min via INTRAVENOUS

## 2021-02-10 MED ORDER — PROPOFOL 500 MG/50ML IV EMUL
INTRAVENOUS | Status: AC
  Filled 2021-02-10: qty 50

## 2021-02-10 MED ORDER — LIDOCAINE 2% (20 MG/ML) 5 ML SYRINGE
INTRAMUSCULAR | Status: DC | PRN
  Administered 2021-02-10: 80 mg via INTRAVENOUS

## 2021-02-10 MED ORDER — GLYCOPYRROLATE PF 0.2 MG/ML IJ SOSY
PREFILLED_SYRINGE | INTRAMUSCULAR | Status: DC | PRN
  Administered 2021-02-10: .1 mg via INTRAVENOUS

## 2021-02-10 MED ORDER — LACTATED RINGERS IV SOLN
INTRAVENOUS | Status: DC
  Administered 2021-02-10: 1000 mL via INTRAVENOUS

## 2021-02-10 MED ORDER — SODIUM CHLORIDE 0.9 % IV SOLN: INTRAVENOUS | Status: DC

## 2021-02-10 SURGICAL SUPPLY — 25 items

## 2021-02-10 NOTE — Transfer of Care (Signed)
Immediate Anesthesia Transfer of Care Note  Patient: Jeffrey [name]  Procedure(s) Performed: COLONOSCOPY WITH PROPOFOL (N/A ) ESOPHAGOGASTRODUODENOSCOPY (EGD) WITH PROPOFOL (N/A ) BIOPSY  Patient Location: Endoscopy Unit  Anesthesia Type:MAC  Level of Consciousness: awake, alert  and oriented  Airway & Oxygen Therapy: Patient Spontanous Breathing and Patient connected to face mask oxygen  Post-op Assessment: Report given to RN, Post -op Vital signs reviewed and stable and Patient moving all extremities X 4  Post vital signs: Reviewed and stable  Last Vitals:  Vitals Value Taken Time  BP 144/118 02/10/21 0910  Temp 36.6 C 02/10/21 0905  Pulse 87 02/10/21 0911  Resp 12 02/10/21 0910  SpO2 100 % 02/10/21 0911  Vitals shown include unvalidated device data.  Last Pain:  Vitals:   02/10/21 0910  TempSrc:   PainSc: 0-No pain         Complications: No complications documented.

## 2021-02-10 NOTE — Discharge Instructions (Signed)
Nothing bad here.  Some possible stomach inflammation - biopsies taken.  Hemorrhoids seen - causing bleeding - these are a minor nuisance.  Once I get pathology results back will contact you.   I appreciate the opportunity to care for you. Gatha Mayer, MD, FACG    YOU HAD AN ENDOSCOPIC PROCEDURE TODAY: Refer to the procedure report and other information in the discharge instructions given to you for any specific questions about what was found during the examination. If this information does not answer your questions, please call [place] office at [phone] to clarify.   YOU SHOULD EXPECT: Some feelings of bloating in the abdomen. Passage of more gas than usual. Walking can help get rid of the air that was put into your GI tract during the procedure and reduce the bloating. If you had a lower endoscopy (such as a colonoscopy or flexible sigmoidoscopy) you may notice spotting of blood in your stool or on the toilet paper. Some abdominal soreness may be present for a day or two, also.  DIET: Your first meal following the procedure should be a light meal and then it is ok to progress to your normal diet. A half-sandwich or bowl of soup is an example of a good first meal. Heavy or fried foods are harder to digest and may make you feel nauseous or bloated. Drink plenty of fluids but you should avoid alcoholic beverages for 24 hours. If you had a esophageal dilation, please see attached instructions for diet.    ACTIVITY: Your care partner should take you home directly after the procedure. You should plan to take it easy, moving slowly for the rest of the day. You can resume normal activity the day after the procedure however YOU SHOULD NOT DRIVE, use power tools, machinery or perform tasks that involve climbing or major physical exertion for 24 hours (because of the sedation medicines used during the test).   SYMPTOMS TO REPORT IMMEDIATELY: A gastroenterologist can be reached at any hour.  Please call [phone]  for any of the following symptoms:  . Following lower endoscopy (colonoscopy, flexible sigmoidoscopy) Excessive amounts of blood in the stool  Significant tenderness, worsening of abdominal pains  Swelling of the abdomen that is new, acute  Fever of 100 or higher    FOLLOW UP:  If any biopsies were taken you will be contacted by phone or by letter within the next 1-3 weeks. Call [phone]  if you have not heard about the biopsies in 3 weeks.  Please also call with any specific questions about appointments or follow up tests.

## 2021-02-10 NOTE — Anesthesia Postprocedure Evaluation (Signed)
Anesthesia Post Note  Patient: [name]  Procedure(s) Performed: COLONOSCOPY WITH PROPOFOL (N/A ) ESOPHAGOGASTRODUODENOSCOPY (EGD) WITH PROPOFOL (N/A ) BIOPSY     Patient location during evaluation: PACU Anesthesia Type: MAC Level of consciousness: awake and alert Pain management: pain level controlled Vital Signs Assessment: post-procedure vital signs reviewed and stable Respiratory status: spontaneous breathing, nonlabored ventilation, respiratory function stable and patient connected to nasal cannula oxygen Cardiovascular status: stable and blood pressure returned to baseline Postop Assessment: no apparent nausea or vomiting Anesthetic complications: no   No complications documented.  Last Vitals:  Vitals:   02/10/21 0933 02/10/21 0940  BP: (!) 138/108 (!) 136/116  Pulse: 78 82  Resp: 16 (!) 22  Temp:    SpO2: 94% 95%    Last Pain:  Vitals:   02/10/21 0940  TempSrc:   PainSc: 0-No pain                 Effie [place]kshire

## 2021-02-10 NOTE — Anesthesia Preprocedure Evaluation (Addendum)
Anesthesia Evaluation  Patient identified by MRN, date of birth, ID band Patient awake    Reviewed: Allergy & Precautions, NPO status , Patient's Chart, lab work & pertinent test results  Airway Mallampati: III  TM Distance: >3 FB Neck ROM: Full    Dental  (+) Teeth Intact, Dental Advisory Given   Pulmonary asthma , sleep apnea and Continuous Positive Airway Pressure Ventilation , Current Smoker and Patient abstained from smoking.,    breath sounds clear to auscultation       Cardiovascular hypertension, Pt. on home beta blockers +CHF  + dysrhythmias Atrial Fibrillation + Cardiac Defibrillator  Rhythm:Regular Rate:Normal     Neuro/Psych PSYCHIATRIC DISORDERS Anxiety negative neurological ROS     GI/Hepatic negative GI ROS, Neg liver ROS,   Endo/Other  diabetes, Type 2, Oral Hypoglycemic Agents, Insulin Dependent  Renal/GU CRFRenal disease     Musculoskeletal  (+) Arthritis ,   Abdominal (+) + obese,   Peds  Hematology   Anesthesia Other Findings   Reproductive/Obstetrics                            Anesthesia Physical Anesthesia Plan  ASA: IV  Anesthesia Plan: MAC   Post-op Pain Management:    Induction: Intravenous  PONV Risk Score and Plan: 0 and Propofol infusion  Airway Management Planned: Natural Airway and Nasal Cannula  Additional Equipment: None  Intra-op Plan:   Post-operative Plan:   Informed Consent: I have reviewed the patients History and Physical, chart, labs and discussed the procedure including the risks, benefits and alternatives for the proposed anesthesia with the patient or authorized representative who has indicated his/her understanding and acceptance.       Plan Discussed with: CRNA  Anesthesia Plan Comments: (Phenylephrine gtt, etomidate, ketamine, no large boluses of propofol    Echo: 1. Left ventricular ejection fraction, by estimation, is 20  to 25%. The  left ventricle has severely decreased function. The left ventricle  demonstrates global hypokinesis. The left ventricular internal cavity size  was mildly dilated. There is mild left  ventricular hypertrophy. Left ventricular diastolic parameters are  consistent with Grade I diastolic dysfunction (impaired relaxation).  2. Right ventricular systolic function is normal. The right ventricular  size is normal.  3. Left atrial size was mildly dilated.  4. The mitral valve is normal in structure. Trivial mitral valve  regurgitation. No evidence of mitral stenosis.  5. The aortic valve is normal in structure. Aortic valve regurgitation is  not visualized. No aortic stenosis is present.  6. The inferior vena cava is normal in size with greater than 50%  respiratory variability, suggesting right atrial pressure of 3 mmHg. )      Anesthesia Quick Evaluation

## 2021-02-10 NOTE — Op Note (Addendum)
[facility] Patient Name: Jeffrey [name] Procedure Date: 02/10/2021 MRN: [id number] Attending MD: Gatha Mayer , MD Date of Birth: Mar 04, 1976 CSN: [id number] Age: 45 Admit Type: Inpatient Procedure:                Upper GI endoscopy Indications:              Iron deficiency anemia Providers:                Gatha Mayer, MD, Ladona Ridgel, Technician,                            Dulcy Fanny RN, Caryl Pina CRNA Referring MD:             [name] Medicines:                Propofol per Anesthesia, Monitored Anesthesia Care Complications:            No immediate complications. Estimated Blood Loss:     Estimated blood loss was minimal. Procedure:                Pre-Anesthesia Assessment:                           - Prior to the procedure, a History and Physical                            was performed, and patient medications and                            allergies were reviewed. The patient's tolerance of                            previous anesthesia was also reviewed. The risks                            and benefits of the procedure and the sedation                            options and risks were discussed with the patient.                            All questions were answered, and informed consent                            was obtained. Prior Anticoagulants: The patient                            last took Eliquis (apixaban) 2 days prior to the                            procedure. ASA Grade Assessment: IV - A patient                            with severe systemic disease that is a constant  threat to life. After reviewing the risks and                            benefits, the patient was deemed in satisfactory                            condition to undergo the procedure.                           After obtaining informed consent, the endoscope was                            passed under direct vision. Throughout the                             procedure, the patient's blood pressure, pulse, and                            oxygen saturations were monitored continuously. The                            GIF-H190 ([id number]) Olympus gastroscope was                            introduced through the mouth, and advanced to the                            second part of duodenum. The upper GI endoscopy was                            accomplished without difficulty. The patient                            tolerated the procedure well. Scope In: Scope Out: Findings:      Patchy mildly erythematous mucosa without bleeding was found in the       gastric antrum. Biopsies were taken with a cold forceps for histology.       Verification of patient identification for the specimen was done.       Estimated blood loss was minimal.      The exam was otherwise without abnormality.      The cardia and gastric fundus were normal on retroflexion. Impression:               - Erythematous mucosa in the antrum. Biopsied.                           - The examination was otherwise normal. Moderate Sedation:      Not Applicable - Patient had care per Anesthesia. Recommendation:           - Patient has a contact number available for                            emergencies. The signs and symptoms of potential  delayed complications were discussed with the                            patient. Return to normal activities tomorrow.                            Written discharge instructions were provided to the                            patient.                           - Resume previous diet.                           - Continue present medications.                           - Resume Eliquis (apixaban) at prior dose today.                           - See the other procedure note for documentation of                            additional recommendations. Procedure Code(s):        --- Professional ---                            [phone], Esophagogastroduodenoscopy, flexible,                            transoral; with biopsy, single or multiple Diagnosis Code(s):        --- Professional ---                           K31.89, Other diseases of stomach and duodenum                           D50.9, Iron deficiency anemia, unspecified CPT copyright 2019 [employer]. All rights reserved. The codes documented in this report are preliminary and upon coder review may  be revised to meet current compliance requirements. Gatha Mayer, MD 02/10/2021 9:22:23 AM This report has been signed electronically. Number of Addenda: 0

## 2021-02-10 NOTE — H&P (Signed)
[facility] History and Physical   Primary Care Physician:  [name], FNP (Inactive)   Reason for Procedure:  Iron deficiency anemia and rectal bleeding  Plan:    egd/colonoscopy     HPI: Jeffrey [name] is a 45 y.o. male with hx rectal bleeding and heartburn and iron deficiency here for egd and colonoscopy. Apixiban was held x 2 d.   Past Medical History:  Diagnosis Date  . Anxiety   . Aspirin allergy   . Childhood asthma   . Chronic systolic CHF (congestive heart failure) ([place])    a. EF 20-25% in 2012. b. EF 45-50% in 10/2011 with nonischemic nuc - presumed NICM. c. 12/2014 Echo: Sev depressed LV fxn, sev dil LV, mild LVH, mild MR, sev dil LA, mildly reduced RV fxn.  . CKD (chronic kidney disease) stage 2, GFR 60-89 ml/min   . H/O vasectomy 12/2019  . High cholesterol   . Hypertension   . Morbid obesity ([place])   . Nephrolithiasis   . OSA on CPAP   . Paroxysmal atrial fibrillation (HCC)   . Presumed NICM    a. 04/2014 Myoview: EF 26%, glob HK, sev glob HK, ? prior infarct;  b. Never cathed 2/2 CKD.  [name] Renal cell carcinoma ([place])    a. s/p Rt robotic assisted partial converted to radical nephrectomy on 01/2013.  . Troponin level elevated    a. 04/2014, 12/2014: felt due to CHF.  . Type II diabetes mellitus ([place])   . Ventricular tachycardia ([place])    a. appropriate ICD therapy 12/2017    Past Surgical History:  Procedure Laterality Date  . APPENDECTOMY  07/2004  . CARDIAC CATHETERIZATION N/A 05/17/2015   Procedure: Right/Left Heart Cath and Coronary Angiography;  Surgeon: Jolaine Artist, MD;  Location: [place] [facility];  Service: Cardiovascular;  Laterality: N/A;  . EP IMPLANTABLE DEVICE N/A 05/17/2015   Procedure: SubQ ICD Implant;  Surgeon: Will Meredith Leeds, MD;  Location: [place] [facility];  Service: Cardiovascular;  Laterality: N/A;  . ROBOTIC ASSISTED LAPAROSCOPIC LYSIS OF ADHESION  01/13/2013   Procedure: ROBOTIC ASSISTED LAPAROSCOPIC LYSIS OF  ADHESION EXTENSIVE;  Surgeon: Alexis Frock, MD;  Location: [facility];  Service: Urology;;  . ROBOTIC ASSITED PARTIAL NEPHRECTOMY Right 01/13/2013   Procedure: ROBOTIC ASSITED PARTIAL NEPHRECTOMY CONVERTED TO ROBOTIC ASSISTED RIGHT RADICAL NEPHRECTOMY;  Surgeon: Alexis Frock, MD;  Location: [facility];  Service: Urology;  Laterality: Right;  . SUBQ ICD CHANGEOUT N/A 05/16/2020   Procedure: SUBQ ICD CHANGEOUT;  Surgeon: [name], MD;  Location: [place] [facility];  Service: Cardiovascular;  Laterality: N/A;  . SUBQ ICD CHANGEOUT N/A 05/15/2020   Procedure: VFIE ICD CHANGEOUT;  Surgeon: [name], MD;  Location: [place] [facility];  Service: Cardiovascular;  Laterality: N/A;  . VASECTOMY      Prior to Admission medications   Medication Sig Start Date End Date Taking? Authorizing Provider  Accu-Chek FastClix Lancets MISC USE AS DIRECTED FOUR TIMES DAILY 01/29/21 01/29/22 Yes Vevelyn Francois, NP  allopurinol (ZYLOPRIM) 100 MG tablet Take 1 tablet (100 mg total) by mouth daily. 01/09/21  Yes Clegg, Amy D, NP  amiodarone (PACERONE) 100 MG tablet TAKE 1 TABLET (100 MG TOTAL) BY MOUTH DAILY. Patient taking differently: Take 100 mg by mouth daily. 01/09/21 01/09/22 Yes Clegg, Amy D, NP  apixaban (ELIQUIS) 5 MG TABS tablet TAKE 1 TABLET (5 MG TOTAL) BY MOUTH 2 (TWO) TIMES DAILY. [place] DOSE ON SUNDAY  Patient taking differently: Take 5 mg by mouth 2 (two) times daily. 01/09/21 01/09/22 Yes Clegg, Amy D, NP  busPIRone (BUSPAR) 10 MG tablet TAKE 1 TABLET (10 MG TOTAL) BY MOUTH 2 (TWO) TIMES DAILY. Patient taking differently: Take 10 mg by mouth 2 (two) times daily. 06/24/20 06/24/21 Yes [name], FNP  carvedilol (COREG) 6.25 MG tablet TAKE 1 TABLET (6.25 MG TOTAL) BY MOUTH 2 (TWO) TIMES DAILY. Patient taking differently: Take 6.25 mg by mouth 2 (two) times daily. 08/07/20 08/07/21 Yes Bensimhon, Shaune Pascal, MD  cetirizine (ZYRTEC) 10 MG tablet Take 1 tablet (10 mg total) by mouth daily. 06/27/20   Yes [name], FNP  Continuous Blood Gluc Receiver (FREESTYLE LIBRE 14 DAY READER) DEVI 1 each by Does not apply route 3 (three) times daily between meals as needed. 03/19/20  Yes [name], FNP  empagliflozin (JARDIANCE) 10 MG TABS tablet TAKE 1 TABLET (10 MG TOTAL) BY MOUTH DAILY BEFORE BREAKFAST. Patient taking differently: Take 10 mg by mouth daily before breakfast. 12/06/20 12/06/21 Yes [place], Maricela Bo, FNP  famotidine (PEPCID) 40 MG tablet TAKE 1 TABLET (40 MG TOTAL) BY MOUTH DAILY AS NEEDED FOR HEARTBURN OR INDIGESTION. Patient taking differently: Take 40 mg by mouth daily as needed for indigestion or heartburn. 12/25/20 12/25/21 Yes Gatha Mayer, MD  ferrous sulfate 325 (65 FE) MG EC tablet Take 1 tablet (325 mg total) by mouth daily with breakfast. 12/26/20  Yes Gatha Mayer, MD  fluticasone [facility]) 50 MCG/ACT nasal spray Place 2 sprays into both nostrils daily. 09/18/19  Yes [name], FNP  gabapentin (NEURONTIN) 300 MG capsule Take 1 capsule (300 mg total) by mouth 3 (three) times daily. 06/27/20  Yes [name], FNP  glipiZIDE (GLUCOTROL) 10 MG tablet Take 1 tablet (10 mg total) by mouth 2 (two) times daily before a meal. 06/27/20  Yes [name], FNP  insulin aspart (NOVOLOG) 100 UNIT/ML FlexPen INJECT 0-15 UNIT(S) INTO THE SKIN THREE TIMES DAILY WITH MEALS VIA SLIDING SCALE Patient taking differently: Inject 0-15 Units into the skin 3 (three) times daily with meals. 11/15/20 11/15/21 Yes [name], FNP  insulin NPH-regular Human (70-30) 100 UNIT/ML injection INJECT 38 UNITS INTO THE SKIN 2 (TWO) TIMES DAILY WITH A MEAL. Patient taking differently: Inject 38 Units into the skin 2 (two) times daily with a meal. 06/27/20 06/27/21 Yes [name], FNP  Insulin Pen Needle 31G X 5 MM MISC USE AS DIRECTED 11/27/20 11/27/21 Yes [name], FNP  Insulin Syringe-Needle U-100 31G X 5/16" 0.5 ML MISC USE TO INJECT INSULIN TWICE DAILY. 09/17/20  09/17/21 Yes [name], FNP  Magnesium Oxide 200 MG TABS Take 100 mg by mouth daily.   Yes [provider]  metFORMIN (GLUCOPHAGE) 1000 MG tablet Take 1 tablet (1,000 mg total) by mouth 2 (two) times daily with a meal. 01/09/21  Yes Clegg, Amy D, NP  sacubitril-valsartan (ENTRESTO) 49-51 MG TAKE 1 TABLET BY MOUTH 2 (TWO) TIMES DAILY. 01/24/21  Yes Clegg, Amy D, NP  spironolactone (ALDACTONE) 25 MG tablet Take 1 tablet (25 mg total) by mouth daily. 01/09/21 08/07/21 Yes Clegg, Amy D, NP  torsemide (DEMADEX) 20 MG tablet TAKE 1 TABLET (20 MG TOTAL) BY MOUTH DAILY. Patient taking differently: Take 20 mg by mouth daily. 01/09/21 01/09/22 Yes Clegg, Amy D, NP  traZODone (DESYREL) 100 MG tablet Take 1 tablet (100 mg total) by mouth at bedtime. For sleep Patient taking differently:  Take 100 mg by mouth at bedtime as needed for sleep. 06/27/20  Yes [name], FNP  Vitamin D, Ergocalciferol, (DRISDOL) 1.25 MG (50000 UNIT) CAPS capsule TAKE 1 CAPSULE (50,000 UNITS TOTAL) BY MOUTH ONCE A WEEK. Patient taking differently: Take 50,000 Units by mouth every Monday. 10/29/20 10/29/21 Yes [name], FNP  acetaminophen (TYLENOL) 325 MG tablet Take 2 tablets (650 mg total) by mouth every 4 (four) hours as needed for mild pain, moderate pain, fever or headache. Patient not taking: Reported on 01/29/2021 05/17/20   Shirley Friar, PA-C  blood glucose meter kit and supplies Use up to four times daily as directed. 01/27/21   Vevelyn Francois, NP  Continuous Blood Gluc Sensor (GUARDIAN SENSOR 3) MISC 1 Device by Does not apply route 3 (three) times daily between meals as needed. 03/25/20   [name], FNP  Colchicine (MITIGARE) 0.6 MG CAPS Take 1 tablet by mouth 2 (two) times daily as needed (gout). 06/27/20 11/26/20  [name], FNP    Current Facility-Administered Medications  Medication Dose Route Frequency Provider Last Rate Last Admin  . 0.9 %  sodium chloride infusion    Intravenous Continuous Gatha Mayer, MD      . lactated ringers infusion   Intravenous Continuous Gatha Mayer, MD 10 mL/hr at 02/10/21 0813 Continued from Pre-op at 02/10/21 0813    Allergies as of 12/25/2020 - Review Complete 12/25/2020  Allergen Reaction Noted  . Aspirin Shortness Of Breath, Itching, and Rash 10/22/2011  . Bee venom Hives and Swelling 06/18/2013  . Lisinopril Cough 10/10/2015  . Tomato Rash 04/03/2012    Family History  Problem Relation Age of Onset  . Hypertension Mother   . Diabetes Maternal Aunt   . Heart attack Neg Hx   . Stroke Neg Hx   . Colon cancer Neg Hx   . Pancreatic cancer Neg Hx   . Stomach cancer Neg Hx   . Liver cancer Neg Hx   . 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  Physical Exam: Vital signs in last 24 hours: Temp:  [98 F (36.7 C)] 98 F (36.7 C) (05/09 0745) Pulse Rate:  [74] 74 (05/09 0745) Resp:  [20] 20 (05/09 0745) BP: (163)/(110) 163/110 (05/09 0745) SpO2:  [99 %] 99 % (05/09 0745) Weight:  [147.4 kg] 147.4 kg (05/09 0745)   General:   Alert,  Well-developed, well-nourished, pleasant and cooperative in NAD Lungs:  Clear throughout to auscultation.   Heart:  Regular rate and rhythm; no murmurs, clicks, rubs,  or gallops. Abdomen:  Soft, nontender and nondistended. Normal bowel sounds.   Neuro/Psych:  Alert and cooperative. Normal mood and affect. A and O x 3   _0  E. Carlean Purl, MD, [facility] [phone] (pager) 02/10/2021 8:24 AM@

## 2021-02-10 NOTE — Op Note (Addendum)
[facility] Patient Name: Jeffrey [name] Procedure Date: 02/10/2021 MRN: [id number] Attending MD: Gatha Mayer , MD Date of Birth: 1976-06-17 CSN: [id number] Age: 45 Admit Type: Outpatient Procedure:                Colonoscopy Indications:              Rectal bleeding, Iron deficiency anemia Providers:                Gatha Mayer, MD, Ladona Ridgel, Technician,                            Caryl Pina CRNA, Dulcy Fanny RN Referring MD:             [name] Medicines:                Propofol per Anesthesia, Monitored Anesthesia Care Complications:            No immediate complications. Estimated Blood Loss:     Estimated blood loss: none. Procedure:                Pre-Anesthesia Assessment:                           - Prior to the procedure, a History and Physical                            was performed, and patient medications and                            allergies were reviewed. The patient's tolerance of                            previous anesthesia was also reviewed. The risks                            and benefits of the procedure and the sedation                            options and risks were discussed with the patient.                            All questions were answered, and informed consent                            was obtained. Prior Anticoagulants: The patient                            last took Eliquis (apixaban) 2 days prior to the                            procedure. ASA Grade Assessment: IV - A patient                            with severe systemic disease that is a constant  threat to life. After reviewing the risks and                            benefits, the patient was deemed in satisfactory                            condition to undergo the procedure.                           - Prior to the procedure, a History and Physical                            was performed, and patient medications and                             allergies were reviewed. The patient's tolerance of                            previous anesthesia was also reviewed. The risks                            and benefits of the procedure and the sedation                            options and risks were discussed with the patient.                            All questions were answered, and informed consent                            was obtained. Prior Anticoagulants: The patient                            last took Eliquis (apixaban) 2 days prior to the                            procedure. ASA Grade Assessment: IV - A patient                            with severe systemic disease that is a constant                            threat to life. After reviewing the risks and                            benefits, the patient was deemed in satisfactory                            condition to undergo the procedure.                           After obtaining informed consent, the colonoscope  was passed under direct vision. Throughout the                            procedure, the patient's blood pressure, pulse, and                            oxygen saturations were monitored continuously. The                            CF-HQ190L ([id number]) Olympus colonoscope was                            introduced through the anus and advanced to the the                            cecum, identified by appendiceal orifice and                            ileocecal valve. The colonoscopy was performed                            without difficulty. The patient tolerated the                            procedure well. The quality of the bowel                            preparation was excellent. The bowel preparation                            used was SUPREP via split dose instruction. The                            ileocecal valve, appendiceal orifice, and rectum                            were photographed. Scope  In: 8:46:03 AM Scope Out: 9:00:26 AM Scope Withdrawal Time: 0 hours 10 minutes 15 seconds  Total Procedure Duration: 0 hours 14 minutes 23 seconds  Findings:      The perianal and digital rectal examinations were normal. Pertinent       negatives include normal prostate (size, shape, and consistency).      External and internal hemorrhoids were found.      Multiple small-mouthed diverticula were found in the sigmoid colon.      The exam was otherwise without abnormality on direct and retroflexion       views. Impression:               - External and internal hemorrhoids. If bleeding                            becvomes major issue consider banding internal                            hemorrhoids                           -  Diverticulosis in the sigmoid colon.                           - The examination was otherwise normal on direct                            and retroflexion views.                           - No specimens collected. Moderate Sedation:      Not Applicable - Patient had care per Anesthesia. Recommendation:           - Patient has a contact number available for                            emergencies. The signs and symptoms of potential                            delayed complications were discussed with the                            patient. Return to normal activities tomorrow.                            Written discharge instructions were provided to the                            patient.                           - Continue present medications.                           - Resume Eliquis (apixaban) at prior dose today.                           - Repeat colonoscopy in 10 years for screening                            purposes. Procedure Code(s):        --- Professional ---                           [phone], Colonoscopy, flexible; diagnostic, including                            collection of specimen(s) by brushing or washing,                            when performed  (separate procedure) Diagnosis Code(s):        --- Professional ---                           O87.8, Other hemorrhoids                           K62.5, Hemorrhage of  anus and rectum                           D50.9, Iron deficiency anemia, unspecified                           K57.30, Diverticulosis of large intestine without                            perforation or abscess without bleeding CPT copyright 2019 American Medical Association. All rights reserved. The codes documented in this report are preliminary and upon coder review may  be revised to meet current compliance requirements. Gatha Mayer, MD 02/10/2021 9:26:22 AM This report has been signed electronically. Number of Addenda: 0

## 2021-02-11 ENCOUNTER — Encounter (HOSPITAL_COMMUNITY): Payer: Self-pay | Admitting: Internal Medicine

## 2021-02-11 ENCOUNTER — Other Ambulatory Visit: Payer: Self-pay

## 2021-02-11 LAB — SURGICAL PATHOLOGY

## 2021-02-19 ENCOUNTER — Ambulatory Visit (INDEPENDENT_AMBULATORY_CARE_PROVIDER_SITE_OTHER): Payer: Medicaid Other

## 2021-02-19 DIAGNOSIS — I472 Ventricular tachycardia, unspecified: Secondary | ICD-10-CM

## 2021-02-20 LAB — CUP PACEART REMOTE DEVICE CHECK
Battery Remaining Percentage: 93 %
Date Time Interrogation Session: 20220518124600
Implantable Lead Implant Date: 20160812
Implantable Lead Location: 753862
Implantable Lead Model: 3401
Implantable Pulse Generator Implant Date: 20210812
Pulse Gen Serial Number: 139514

## 2021-02-21 ENCOUNTER — Other Ambulatory Visit: Payer: Self-pay | Admitting: Obstetrics and Gynecology

## 2021-02-21 ENCOUNTER — Other Ambulatory Visit: Payer: Self-pay

## 2021-02-21 ENCOUNTER — Encounter: Payer: Self-pay | Admitting: Internal Medicine

## 2021-02-21 NOTE — Patient Outreach (Signed)
Care Coordination  02/21/2021  EMIL WEIGOLD 1976-04-20 [id number]   RNCM called patient at scheduled time.  Patient at son's play-will reschedule appointment.  [name] RN, BSN [place]  [employer] - Managed Medicaid High Risk [phone].

## 2021-02-27 MED FILL — Buspirone HCl Tab 10 MG: ORAL | 30 days supply | Qty: 60 | Fill #0 | Status: CN

## 2021-02-28 ENCOUNTER — Telehealth: Payer: Self-pay

## 2021-02-28 ENCOUNTER — Other Ambulatory Visit: Payer: Self-pay

## 2021-02-28 NOTE — Telephone Encounter (Signed)
Med refills  Buspirone Carvedilol Ferrous sulfate

## 2021-03-04 ENCOUNTER — Other Ambulatory Visit: Payer: Self-pay | Admitting: Nurse Practitioner

## 2021-03-04 ENCOUNTER — Other Ambulatory Visit: Payer: Self-pay | Admitting: Internal Medicine

## 2021-03-04 ENCOUNTER — Other Ambulatory Visit: Payer: Self-pay

## 2021-03-04 DIAGNOSIS — G4700 Insomnia, unspecified: Secondary | ICD-10-CM

## 2021-03-04 MED ORDER — FERROUS SULFATE 325 (65 FE) MG PO TABS
325.0000 mg | ORAL_TABLET | Freq: Every day | ORAL | 0 refills | Status: DC
  Filled 2021-03-04 – 2021-03-07 (×2): qty 90, 90d supply, fill #0

## 2021-03-04 MED FILL — Carvedilol Tab 6.25 MG: ORAL | 30 days supply | Qty: 30 | Fill #1 | Status: CN

## 2021-03-05 ENCOUNTER — Other Ambulatory Visit: Payer: Self-pay

## 2021-03-05 MED ORDER — TRAZODONE HCL 100 MG PO TABS
100.0000 mg | ORAL_TABLET | Freq: Every day | ORAL | 3 refills | Status: DC
  Filled 2021-03-05 – 2021-03-07 (×2): qty 90, 90d supply, fill #0

## 2021-03-07 ENCOUNTER — Other Ambulatory Visit: Payer: Self-pay

## 2021-03-07 MED FILL — Carvedilol Tab 6.25 MG: ORAL | 90 days supply | Qty: 180 | Fill #1 | Status: AC

## 2021-03-07 MED FILL — Famotidine Tab 40 MG: ORAL | 30 days supply | Qty: 30 | Fill #0 | Status: AC

## 2021-03-07 MED FILL — Buspirone HCl Tab 10 MG: ORAL | 30 days supply | Qty: 60 | Fill #0 | Status: AC

## 2021-03-10 ENCOUNTER — Other Ambulatory Visit: Payer: Self-pay

## 2021-03-10 MED FILL — Insulin NPH Isophane & Regular Human Inj 100 Unit/ML (70-30): SUBCUTANEOUS | 39 days supply | Qty: 30 | Fill #0 | Status: AC

## 2021-03-11 ENCOUNTER — Ambulatory Visit (INDEPENDENT_AMBULATORY_CARE_PROVIDER_SITE_OTHER): Payer: Self-pay | Admitting: Family Medicine

## 2021-03-11 ENCOUNTER — Other Ambulatory Visit: Payer: Self-pay

## 2021-03-13 NOTE — Progress Notes (Signed)
Remote ICD transmission.   

## 2021-03-24 ENCOUNTER — Other Ambulatory Visit: Payer: Self-pay | Admitting: Obstetrics and Gynecology

## 2021-03-24 NOTE — Patient Instructions (Signed)
Hi Mr. Wildes, sorry I missed you today, hope you are doing well, I will try to reach you another day - as a part of your Medicaid benefit, you are eligible for care management and care coordination services at no cost or copay. I was unable to reach you by phone today but would be happy to help you with your health related needs. Please feel free to call me at [phone].  A member of the Managed Medicaid care management team will reach out to you again over the next 7-14 days.   [name] RN, BSN [place]  [employer] - Managed Medicaid High Risk [phone].

## 2021-03-24 NOTE — Patient Outreach (Signed)
Care Coordination  03/24/2021  Jeffrey [name] 1976/09/02 [id number]   Medicaid Managed Care   Unsuccessful Outreach Note  03/24/2021 Name: Jeffrey [name] MRN: [id number] DOB: [birth year]  Referred by: [name], FNP (Inactive) Reason for referral : High Risk Managed Medicaid (Unsuccessful telephone outreach)   An unsuccessful telephone outreach was attempted today. The patient was referred to the case management team for assistance with care management and care coordination.   Follow Up Plan:  A member of the Managed Medicaid care management team will reach out to the patient again over the next 7-14 days.   [name] RN, BSN [place]  [employer] - Managed Medicaid High Risk [phone].

## 2021-03-25 ENCOUNTER — Ambulatory Visit (INDEPENDENT_AMBULATORY_CARE_PROVIDER_SITE_OTHER): Payer: Self-pay | Admitting: Family Medicine

## 2021-04-01 ENCOUNTER — Ambulatory Visit (INDEPENDENT_AMBULATORY_CARE_PROVIDER_SITE_OTHER): Payer: Self-pay | Admitting: Family Medicine

## 2021-04-02 ENCOUNTER — Other Ambulatory Visit: Payer: Self-pay

## 2021-04-02 ENCOUNTER — Ambulatory Visit (HOSPITAL_COMMUNITY)
Admission: EM | Admit: 2021-04-02 | Discharge: 2021-04-02 | Disposition: A | Discharge status: Home / Self Care | Payer: Medicaid Other | Attending: Emergency Medicine | Admitting: Emergency Medicine

## 2021-04-02 ENCOUNTER — Encounter (HOSPITAL_COMMUNITY): Payer: Self-pay

## 2021-04-02 DIAGNOSIS — M79641 Pain in right hand: Secondary | ICD-10-CM | POA: Diagnosis not present

## 2021-04-02 MED ORDER — PREDNISONE 10 MG PO TABS
40.0000 mg | ORAL_TABLET | Freq: Every day | ORAL | 0 refills | Status: AC
  Filled 2021-04-02: qty 20, 5d supply, fill #0

## 2021-04-02 NOTE — ED Triage Notes (Signed)
Pt presents with right hand & wrist pain and swelling X 4 days; pt states he had this before and it was a blood clot.  Pt has Hx of PIC line in area.

## 2021-04-02 NOTE — Discharge Instructions (Addendum)
Take the prednisone daily for the next 5 days.    You can take Ibuprofen and/or Tylenol as needed for pain relief and fever reduction.    You can put ice on the painful area for 20 minutes, 2-3 times a day. Raise the painful joint above the level of your heart as often as you can. Rest the joint as much as possible.  Follow up with your primary care provider as soon as possible for re-evaluation.

## 2021-04-02 NOTE — ED Provider Notes (Signed)
[place]    CSN: [id number] Arrival date & time: 04/02/21  1410      History   Chief Complaint Chief Complaint  Patient presents with   Hand Pain    HPI Jeffrey [name] is a 45 y.o. male.   Patient here for evaluation of right hand and wrist pain and swelling that has been ongoing for the past 4 days.  Reports having similar symptoms associated with a blood clot in February.    Also reports hx of gout.  Reports taking Eliquis and allopurinol.  Reports pain is achy and constant.  Reports decreased ROM due to pain.  Denies any numbness or tingling.  Denies any trauma, injury, or other precipitating event. Denies any fevers, chest pain, shortness of breath, N/V/D, numbness, tingling, weakness, abdominal pain, or headaches.     The history is provided by the patient.  Hand Pain   Past Medical History:  Diagnosis Date   Anxiety    Aspirin allergy    Childhood asthma    Chronic systolic CHF (congestive heart failure) ([place])    a. EF 20-25% in 2012. b. EF 45-50% in 10/2011 with nonischemic nuc - presumed NICM. c. 12/2014 Echo: Sev depressed LV fxn, sev dil LV, mild LVH, mild MR, sev dil LA, mildly reduced RV fxn.   CKD (chronic kidney disease) stage 2, GFR 60-89 ml/min    H/O vasectomy 12/2019   High cholesterol    Hypertension    Morbid obesity ([place])    Nephrolithiasis    OSA on CPAP    Paroxysmal atrial fibrillation ([place])    Presumed NICM    a. 04/2014 Myoview: EF 26%, glob HK, sev glob HK, ? prior infarct;  b. Never cathed 2/2 CKD.   Renal cell carcinoma ([place])    a. s/p Rt robotic assisted partial converted to radical nephrectomy on 01/2013.   Troponin level elevated    a. 04/2014, 12/2014: felt due to CHF.   Type II diabetes mellitus ([place])    Ventricular tachycardia ([place])    a. appropriate ICD therapy 12/2017    Patient Active Problem List   Diagnosis Date Noted   Absolute anemia    Rectal bleeding    Iron deficiency 12/25/2020   Long term current use of  anticoagulant - apixaban 12/25/2020   Paroxysmal atrial fibrillation (HCC)    Lactic acidosis 11/22/2020   Hypotension 11/22/2020   Aspiration into airway    ICD (implantable cardioverter-defibrillator) battery depletion 05/15/2020   Endotracheal tube present    Respiratory failure ([place])    Acute encephalopathy    Acute pulmonary edema (HCC)    NICM (nonischemic cardiomyopathy) ([place]) 04/24/2020   S/P vasectomy 12/20/2019   Anxiety 12/20/2019   Hemoglobin A1C between 7% and 9% indicating borderline diabetic control 12/20/2019   Seasonal allergies 12/20/2019   Insomnia 12/20/2019   Vasectomy evaluation 06/20/2019   Visual problems 06/20/2019   Blurry vision, bilateral 06/20/2019   Hyperglycemia 02/13/2019   Hyperosmolar (nonketotic) coma ([place]) 01/19/2019   AICD (automatic cardioverter/defibrillator) present 01/19/2019   Hyperlipidemia 12/07/2018   Follow-up exam 11/22/2018   Class 3 severe obesity due to excess calories with serious comorbidity and body mass index (BMI) of 45.0 to 49.9 in adult [facility]) 11/22/2018   Dizziness 11/22/2018   Lingular pneumonia 08/04/2018   Ventricular tachycardia ([place]) 12/30/2017   PAF (paroxysmal atrial fibrillation) ([place])    Dilated cardiomyopathy ([place])    Pollen allergy 02/17/2017   Dyslipidemia 02/17/2017   Hematochezia 11/08/2015  CKD (chronic kidney disease), stage III ([place]) 06/20/2015   Cardiac defibrillator in situ    Chronic systolic CHF (congestive heart failure) ([place]) 03/11/2015   Gouty arthritis 03/11/2015   Chest pain 12/28/2014   AKI (acute kidney injury) ([place]) 04/08/2014   Aspirin allergy 04/08/2014   Renal cell carcinoma ([place])    Gout attack 11/10/2011   Morbid obesity ([place]) 10/23/2011   OSA (obstructive sleep apnea) 10/23/2011   Type 2 diabetes mellitus with stage 3 chronic kidney disease, with long-term current use of insulin ([place]) 10/22/2011   Hyperlipidemia associated with type 2 diabetes mellitus ([place]) 12/25/2010    Hypertension associated with diabetes ([place]) 12/25/2010    Past Surgical History:  Procedure Laterality Date   APPENDECTOMY  07/2004   BIOPSY  02/10/2021   Procedure: BIOPSY;  Surgeon: Gatha Mayer, MD;  Location: [facility];  Service: Endoscopy;;   CARDIAC CATHETERIZATION N/A 05/17/2015   Procedure: Right/Left Heart Cath and Coronary Angiography;  Surgeon: Jolaine Artist, MD;  Location: [facility];  Service: Cardiovascular;  Laterality: N/A;   COLONOSCOPY WITH PROPOFOL N/A 02/10/2021   Procedure: COLONOSCOPY WITH PROPOFOL;  Surgeon: Gatha Mayer, MD;  Location: [facility];  Service: Endoscopy;  Laterality: N/A;   EP IMPLANTABLE DEVICE N/A 05/17/2015   Procedure: SubQ ICD Implant;  Surgeon: Will Meredith Leeds, MD;  Location: [place] [facility];  Service: Cardiovascular;  Laterality: N/A;   ESOPHAGOGASTRODUODENOSCOPY (EGD) WITH PROPOFOL N/A 02/10/2021   Procedure: ESOPHAGOGASTRODUODENOSCOPY (EGD) WITH PROPOFOL;  Surgeon: Gatha Mayer, MD;  [facility];  Service: Endoscopy;  Laterality: N/A;   ROBOTIC ASSISTED LAPAROSCOPIC LYSIS OF ADHESION  01/13/2013   Procedure: ROBOTIC ASSISTED LAPAROSCOPIC LYSIS OF ADHESION EXTENSIVE;  Surgeon: Alexis Frock, MD;  Location: [facility];  Service: Urology;;   ROBOTIC ASSITED PARTIAL NEPHRECTOMY Right 01/13/2013   Procedure: ROBOTIC ASSITED PARTIAL NEPHRECTOMY CONVERTED TO ROBOTIC ASSISTED RIGHT RADICAL NEPHRECTOMY;  Surgeon: Alexis Frock, MD;  Location: [facility];  Service: Urology;  Laterality: Right;   SUBQ ICD CHANGEOUT N/A 05/16/2020   Procedure: SUBQ ICD CHANGEOUT;  Surgeon: [name], MD;  Location: [place] [facility];  Service: Cardiovascular;  Laterality: N/A;   SUBQ ICD CHANGEOUT N/A 05/15/2020   Procedure: SUBQ ICD CHANGEOUT;  Surgeon: [name], MD;  Location: [place] [facility];  Service: Cardiovascular;  Laterality: N/A;   VASECTOMY         Home Medications    Prior to Admission medications    Medication Sig Start Date End Date Taking? Authorizing Provider  predniSONE (DELTASONE) 10 MG tablet Take 4 tablets (40 mg total) by mouth daily for 5 days. 04/02/21 04/07/21 Yes Pearson Forster, NP  Accu-Chek FastClix Lancets MISC USE AS DIRECTED FOUR TIMES DAILY 01/29/21 01/29/22  Vevelyn Francois, NP  allopurinol (ZYLOPRIM) 100 MG tablet Take 1 tablet (100 mg total) by mouth daily. 01/09/21   Clegg, Amy D, NP  amiodarone (PACERONE) 100 MG tablet TAKE 1 TABLET (100 MG TOTAL) BY MOUTH DAILY. Patient taking differently: Take 100 mg by mouth daily. 01/09/21 01/09/22  Clegg, Amy D, NP  apixaban (ELIQUIS) 5 MG TABS tablet TAKE 1 TABLET (5 MG TOTAL) BY MOUTH 2 (TWO) TIMES DAILY. RESUME WITH EVENING DOSE ON SUNDAY Patient taking differently: Take 5 mg by mouth 2 (two) times daily. 01/09/21 01/09/22  Clegg, Amy D, NP  blood glucose meter kit and supplies Use up to four times daily as directed. 01/27/21   Vevelyn Francois, NP  busPIRone (BUSPAR) 10 MG tablet TAKE 1 TABLET (10 MG TOTAL) BY MOUTH 2 (TWO) TIMES DAILY. Patient taking differently: Take 10 mg by mouth 2 (two) times daily. 06/24/20 06/24/21  [name], FNP  carvedilol (COREG) 6.25 MG tablet TAKE 1 TABLET (6.25 MG TOTAL) BY MOUTH 2 (TWO) TIMES DAILY. Patient taking differently: Take 6.25 mg by mouth 2 (two) times daily. 08/07/20 08/07/21  Bensimhon, Shaune Pascal, MD  cetirizine (ZYRTEC) 10 MG tablet Take 1 tablet (10 mg total) by mouth daily. 06/27/20   [name], FNP  Continuous Blood Gluc Receiver (FREESTYLE LIBRE 14 DAY READER) DEVI 1 each by Does not apply route 3 (three) times daily between meals as needed. 03/19/20   [name], FNP  Continuous Blood Gluc Sensor (GUARDIAN SENSOR 3) MISC 1 Device by Does not apply route 3 (three) times daily between meals as needed. 03/25/20   [name], FNP  empagliflozin (JARDIANCE) 10 MG TABS tablet TAKE 1 TABLET (10 MG TOTAL) BY MOUTH DAILY BEFORE BREAKFAST. Patient taking differently: Take 10 mg by  mouth daily before breakfast. 12/06/20 12/06/21  Rafael Bihari, FNP  famotidine (PEPCID) 40 MG tablet TAKE 1 TABLET (40 MG TOTAL) BY MOUTH DAILY AS NEEDED FOR HEARTBURN OR INDIGESTION. Patient taking differently: Take 40 mg by mouth daily as needed for indigestion or heartburn. 12/25/20 12/25/21  Gatha Mayer, MD  ferrous sulfate 325 (65 FE) MG tablet Take 1 tablet (325 mg total) by mouth daily with breakfast. 03/04/21   Gatha Mayer, MD  fluticasone [facility]) 50 MCG/ACT nasal spray Place 2 sprays into both nostrils daily. 09/18/19   [name], FNP  gabapentin (NEURONTIN) 300 MG capsule Take 1 capsule (300 mg total) by mouth 3 (three) times daily. 06/27/20   [name], FNP  glipiZIDE (GLUCOTROL) 10 MG tablet Take 1 tablet (10 mg total) by mouth 2 (two) times daily before a meal. 06/27/20   [name], FNP  insulin aspart (NOVOLOG) 100 UNIT/ML FlexPen INJECT 0-15 UNIT(S) INTO THE SKIN THREE TIMES DAILY WITH MEALS VIA SLIDING SCALE Patient taking differently: Inject 0-15 Units into the skin 3 (three) times daily with meals. 11/15/20 11/15/21  [name], FNP  insulin NPH-regular Human (70-30) 100 UNIT/ML injection INJECT 38 UNITS INTO THE SKIN 2 (TWO) TIMES DAILY WITH A MEAL. Patient taking differently: Inject 38 Units into the skin 2 (two) times daily with a meal. 06/27/20 06/27/21  [name], FNP  Insulin Pen Needle 31G X 5 MM MISC USE AS DIRECTED 11/27/20 11/27/21  [name], FNP  Insulin Syringe-Needle U-100 31G X 5/16" 0.5 ML MISC USE TO INJECT INSULIN TWICE DAILY. 09/17/20 09/17/21  [name], FNP  Magnesium Oxide 200 MG TABS Take 100 mg by mouth daily.    [provider]  metFORMIN (GLUCOPHAGE) 1000 MG tablet Take 1 tablet (1,000 mg total) by mouth 2 (two) times daily with a meal. 01/09/21   Clegg, Amy D, NP  sacubitril-valsartan (ENTRESTO) 49-51 MG TAKE 1 TABLET BY MOUTH 2 (TWO) TIMES DAILY. 01/24/21   Clegg, Amy D, NP  spironolactone  (ALDACTONE) 25 MG tablet Take 1 tablet (25 mg total) by mouth daily. 01/09/21 08/07/21  Clegg, Amy D, NP  torsemide (DEMADEX) 20 MG tablet TAKE 1 TABLET (20 MG TOTAL) BY MOUTH DAILY. Patient taking differently: Take 20 mg by mouth daily. 01/09/21 01/09/22  Clegg, Amy D, NP  traZODone (DESYREL) 100 MG tablet Take 1 tablet (100 mg  total) by mouth at bedtime. For sleep 03/05/21   Vevelyn Francois, NP  Vitamin D, Ergocalciferol, (DRISDOL) 1.25 MG (50000 UNIT) CAPS capsule TAKE 1 CAPSULE (50,000 UNITS TOTAL) BY MOUTH ONCE A WEEK. Patient taking differently: Take 50,000 Units by mouth every Monday. 10/29/20 10/29/21  [name], FNP  Colchicine (MITIGARE) 0.6 MG CAPS Take 1 tablet by mouth 2 (two) times daily as needed (gout). 06/27/20 11/26/20  [name], FNP    Family History Family History  Problem Relation Age of Onset   Hypertension Mother    Diabetes Maternal Aunt    Heart attack Neg Hx    Stroke Neg Hx    Colon cancer Neg Hx    Pancreatic cancer Neg Hx    Stomach cancer Neg Hx    Liver cancer Neg Hx    Esophageal cancer Neg Hx     Social History Social History   Tobacco Use   Smoking status: Every Day    Packs/day: 0.25    Years: 22.00    Pack years: 5.50    Types: Cigarettes    Last attempt to quit: 04/16/2015    Years since quitting: 5.9   Smokeless tobacco: Never  Vaping Use   Vaping Use: Never used  Substance Use Topics   Alcohol use: Yes    Comment: 7 drinks per week max   Drug use: Yes    Frequency: 14.0 times per week    Types: Marijuana    Comment: uses marijuana twice a day     Allergies   Aspirin, Bee venom, Lisinopril, and Tomato   Review of Systems Review of Systems  Musculoskeletal:  Positive for arthralgias and joint swelling.  All other systems reviewed and are negative.   Physical Exam Triage Vital Signs ED Triage Vitals  Enc Vitals Group     BP 04/02/21 1457 105/60     Pulse Rate 04/02/21 1457 79     Resp 04/02/21 1457 20     Temp  04/02/21 1457 98.7 F (37.1 C)     Temp Source 04/02/21 1457 Oral     SpO2 04/02/21 1457 97 %     Weight --      Height --      Head Circumference --      Peak Flow --      Pain Score 04/02/21 1455 7     Pain Loc --      Pain Edu? --      Excl. in [place]? --    No data found.  Updated Vital Signs BP 105/60 (BP Location: Left Arm)   Pulse 79   Temp 98.7 F (37.1 C) (Oral)   Resp 20   SpO2 97%   Visual Acuity Right Eye Distance:   Left Eye Distance:   Bilateral Distance:    Right Eye Near:   Left Eye Near:    Bilateral Near:     Physical Exam Vitals and nursing note reviewed.  Constitutional:      General: He is not in acute distress.    Appearance: Normal appearance. He is not ill-appearing, toxic-appearing or diaphoretic.  HENT:     Head: Normocephalic and atraumatic.  Eyes:     Conjunctiva/sclera: Conjunctivae normal.  Cardiovascular:     Rate and Rhythm: Normal rate.     Pulses: Normal pulses.  Pulmonary:     Effort: Pulmonary effort is normal.  Abdominal:     General: Abdomen is flat.  Musculoskeletal:  Right hand: Swelling, tenderness and bony tenderness present. No lacerations. Decreased range of motion (decreased ROM due to pain, full passive ROM). Normal sensation. There is no disruption of two-point discrimination. Normal capillary refill. Normal pulse.     Left hand: Normal.     Cervical back: Normal range of motion.  Skin:    General: Skin is warm and dry.     Findings: Erythema (right hand and wrist) present.  Neurological:     General: No focal deficit present.     Mental Status: He is alert and oriented to person, place, and time.  Psychiatric:        Mood and Affect: Mood normal.     UC Treatments / Results  Labs (all labs ordered are listed, but only abnormal results are displayed) Labs Reviewed - No data to display  EKG   Radiology No results found.  Procedures Procedures (including critical care time)  Medications Ordered in  UC Medications - No data to display  Initial Impression / Assessment and Plan / UC Course  I have reviewed the triage vital signs and the nursing notes.  Pertinent labs & imaging results that were available during my care of the patient were reviewed by me and considered in my medical decision making (see chart for details).    Assessment negative for red flag symptoms.  Patient is already taking Eliquis and has good pulses/cap refill in right hand, making a blood clot unlikely.  Possible gout as he was taken off colchicine.   Will treat with prednisone for 5 days.  May use Tylenol as needed for pain.  Recommend rest, ice, and elevation.  Follow up with primary care as soon as possible for re-evaluation.  Final Clinical Impressions(s) / UC Diagnoses   Final diagnoses:  Right hand pain     Discharge Instructions      Take the prednisone daily for the next 5 days.    You can take Ibuprofen and/or Tylenol as needed for pain relief and fever reduction.    You can put ice on the painful area for 20 minutes, 2-3 times a day. Raise the painful joint above the level of your heart as often as you can. Rest the joint as much as possible.  Follow up with your primary care provider as soon as possible for re-evaluation.      ED Prescriptions     Medication Sig Dispense Auth. Provider   predniSONE (DELTASONE) 10 MG tablet Take 4 tablets (40 mg total) by mouth daily for 5 days. 20 tablet Pearson Forster, NP      PDMP not reviewed this encounter.   Pearson Forster, NP 04/02/21 1534

## 2021-04-03 ENCOUNTER — Other Ambulatory Visit: Payer: Self-pay

## 2021-04-03 ENCOUNTER — Ambulatory Visit (INDEPENDENT_AMBULATORY_CARE_PROVIDER_SITE_OTHER): Payer: Self-pay | Admitting: Bariatrics

## 2021-04-03 DIAGNOSIS — Z0289 Encounter for other administrative examinations: Secondary | ICD-10-CM

## 2021-04-12 ENCOUNTER — Emergency Department (HOSPITAL_BASED_OUTPATIENT_CLINIC_OR_DEPARTMENT_OTHER): Payer: Medicaid Other

## 2021-04-12 ENCOUNTER — Other Ambulatory Visit: Payer: Self-pay

## 2021-04-12 ENCOUNTER — Emergency Department (HOSPITAL_BASED_OUTPATIENT_CLINIC_OR_DEPARTMENT_OTHER)
Admission: EM | Admit: 2021-04-12 | Discharge: 2021-04-12 | Disposition: A | Discharge status: Home / Self Care | Payer: Medicaid Other | Attending: Emergency Medicine | Admitting: Emergency Medicine

## 2021-04-12 ENCOUNTER — Encounter (HOSPITAL_BASED_OUTPATIENT_CLINIC_OR_DEPARTMENT_OTHER): Payer: Self-pay | Admitting: *Deleted

## 2021-04-12 DIAGNOSIS — Z794 Long term (current) use of insulin: Secondary | ICD-10-CM | POA: Insufficient documentation

## 2021-04-12 DIAGNOSIS — Z7901 Long term (current) use of anticoagulants: Secondary | ICD-10-CM | POA: Insufficient documentation

## 2021-04-12 DIAGNOSIS — I480 Paroxysmal atrial fibrillation: Secondary | ICD-10-CM | POA: Insufficient documentation

## 2021-04-12 DIAGNOSIS — M25531 Pain in right wrist: Secondary | ICD-10-CM | POA: Insufficient documentation

## 2021-04-12 DIAGNOSIS — E1122 Type 2 diabetes mellitus with diabetic chronic kidney disease: Secondary | ICD-10-CM | POA: Insufficient documentation

## 2021-04-12 DIAGNOSIS — I5022 Chronic systolic (congestive) heart failure: Secondary | ICD-10-CM | POA: Insufficient documentation

## 2021-04-12 DIAGNOSIS — I130 Hypertensive heart and chronic kidney disease with heart failure and stage 1 through stage 4 chronic kidney disease, or unspecified chronic kidney disease: Secondary | ICD-10-CM | POA: Diagnosis not present

## 2021-04-12 DIAGNOSIS — Z8553 Personal history of malignant neoplasm of renal pelvis: Secondary | ICD-10-CM | POA: Diagnosis not present

## 2021-04-12 DIAGNOSIS — M19031 Primary osteoarthritis, right wrist: Secondary | ICD-10-CM | POA: Diagnosis not present

## 2021-04-12 DIAGNOSIS — F17210 Nicotine dependence, cigarettes, uncomplicated: Secondary | ICD-10-CM | POA: Diagnosis not present

## 2021-04-12 DIAGNOSIS — Z7984 Long term (current) use of oral hypoglycemic drugs: Secondary | ICD-10-CM | POA: Insufficient documentation

## 2021-04-12 DIAGNOSIS — N183 Chronic kidney disease, stage 3 unspecified: Secondary | ICD-10-CM | POA: Insufficient documentation

## 2021-04-12 DIAGNOSIS — Z79899 Other long term (current) drug therapy: Secondary | ICD-10-CM | POA: Diagnosis not present

## 2021-04-12 DIAGNOSIS — M7989 Other specified soft tissue disorders: Secondary | ICD-10-CM | POA: Diagnosis not present

## 2021-04-12 DIAGNOSIS — M79641 Pain in right hand: Secondary | ICD-10-CM | POA: Insufficient documentation

## 2021-04-12 DIAGNOSIS — M19041 Primary osteoarthritis, right hand: Secondary | ICD-10-CM | POA: Diagnosis not present

## 2021-04-12 DIAGNOSIS — J45909 Unspecified asthma, uncomplicated: Secondary | ICD-10-CM | POA: Diagnosis not present

## 2021-04-12 MED ORDER — PREDNISONE 20 MG PO TABS: 60.0000 mg | ORAL_TABLET | Freq: Every day | ORAL | 0 refills | Status: AC

## 2021-04-12 MED ORDER — PREDNISONE 50 MG PO TABS
60.0000 mg | ORAL_TABLET | Freq: Once | ORAL | Status: AC
  Administered 2021-04-12: 60 mg via ORAL
  Filled 2021-04-12: qty 1

## 2021-04-12 MED ORDER — OXYCODONE-ACETAMINOPHEN 5-325 MG PO TABS
1.0000 | ORAL_TABLET | Freq: Once | ORAL | Status: AC
  Administered 2021-04-12: 1 via ORAL
  Filled 2021-04-12: qty 1

## 2021-04-12 MED ORDER — OXYCODONE HCL 5 MG PO TABS: 5.0000 mg | ORAL_TABLET | Freq: Four times a day (QID) | ORAL | 0 refills | Status: DC | PRN

## 2021-04-12 NOTE — ED Triage Notes (Signed)
Pt states he is having pain in his rt hand anow pain is in arm and shoulder. PICC line in Feb, Dx with DVT in arm earlier this year. Hand and arm painful to move.

## 2021-04-12 NOTE — ED Provider Notes (Signed)
[facility] DEPT Provider Note   CSN: [id number] Arrival date & time: 04/12/21  1648     History Chief Complaint  Patient presents with   Hand Pain   Arm Pain    Rt    Jeffrey [name] is a 45 y.o. male.  Here with pain in the right hand and right arm.  History of blood clot in this arm and is on Eliquis.  Recently treated for inflammation in the right hand and wrist with prednisone with great improvement.  Pain is come back again today.  The history is provided by the patient.  Hand Pain This is a recurrent problem. The problem occurs every several days. Progression since onset: waxing and waning. Pertinent negatives include no chest pain, no abdominal pain, no headaches and no shortness of breath. Exacerbated by: movemetn. Relieved by: steroids.  Arm Pain Pertinent negatives include no chest pain, no abdominal pain, no headaches and no shortness of breath.      Past Medical History:  Diagnosis Date   Anxiety    Aspirin allergy    Childhood asthma    Chronic systolic CHF (congestive heart failure) ([place])    a. EF 20-25% in 2012. b. EF 45-50% in 10/2011 with nonischemic nuc - presumed NICM. c. 12/2014 Echo: Sev depressed LV fxn, sev dil LV, mild LVH, mild MR, sev dil LA, mildly reduced RV fxn.   CKD (chronic kidney disease) stage 2, GFR 60-89 ml/min    H/O vasectomy 12/2019   High cholesterol    Hypertension    Morbid obesity ([place])    Nephrolithiasis    OSA on CPAP    Paroxysmal atrial fibrillation ([place])    Presumed NICM    a. 04/2014 Myoview: EF 26%, glob HK, sev glob HK, ? prior infarct;  b. Never cathed 2/2 CKD.   Renal cell carcinoma ([place])    a. s/p Rt robotic assisted partial converted to radical nephrectomy on 01/2013.   Troponin level elevated    a. 04/2014, 12/2014: felt due to CHF.   Type II diabetes mellitus ([place])    Ventricular tachycardia ([place])    a. appropriate ICD therapy 12/2017    Patient Active Problem List   Diagnosis Date Noted    Absolute anemia    Rectal bleeding    Iron deficiency 12/25/2020   Long term current use of anticoagulant - apixaban 12/25/2020   Paroxysmal atrial fibrillation (HCC)    Lactic acidosis 11/22/2020   Hypotension 11/22/2020   Aspiration into airway    ICD (implantable cardioverter-defibrillator) battery depletion 05/15/2020   Endotracheal tube present    Respiratory failure ([place])    Acute encephalopathy    Acute pulmonary edema (HCC)    NICM (nonischemic cardiomyopathy) ([place]) 04/24/2020   S/P vasectomy 12/20/2019   Anxiety 12/20/2019   Hemoglobin A1C between 7% and 9% indicating borderline diabetic control 12/20/2019   Seasonal allergies 12/20/2019   Insomnia 12/20/2019   Vasectomy evaluation 06/20/2019   Visual problems 06/20/2019   Blurry vision, bilateral 06/20/2019   Hyperglycemia 02/13/2019   Hyperosmolar (nonketotic) coma ([place]) 01/19/2019   AICD (automatic cardioverter/defibrillator) present 01/19/2019   Hyperlipidemia 12/07/2018   Follow-up exam 11/22/2018   Class 3 severe obesity due to excess calories with serious comorbidity and body mass index (BMI) of 45.0 to 49.9 in adult ([place]) 11/22/2018   Dizziness 11/22/2018   Lingular pneumonia 08/04/2018   Ventricular tachycardia ([place]) 12/30/2017   PAF (paroxysmal atrial fibrillation) ([place])    Dilated cardiomyopathy ([place])  Pollen allergy 02/17/2017   Dyslipidemia 02/17/2017   Hematochezia 11/08/2015   CKD (chronic kidney disease), stage III ([place]) 06/20/2015   Cardiac defibrillator in situ    Chronic systolic CHF (congestive heart failure) ([place]) 03/11/2015   Gouty arthritis 03/11/2015   Chest pain 12/28/2014   AKI (acute kidney injury) ([place]) 04/08/2014   Aspirin allergy 04/08/2014   Renal cell carcinoma ([place])    Gout attack 11/10/2011   Morbid obesity ([place]) 10/23/2011   OSA (obstructive sleep apnea) 10/23/2011   Type 2 diabetes mellitus with stage 3 chronic kidney disease, with long-term current use of insulin ([place])  10/22/2011   Hyperlipidemia associated with type 2 diabetes mellitus ([place]) 12/25/2010   Hypertension associated with diabetes ([place]) 12/25/2010    Past Surgical History:  Procedure Laterality Date   APPENDECTOMY  07/2004   BIOPSY  02/10/2021   Procedure: BIOPSY;  Surgeon: Gatha Mayer, MD;  Location: [facility];  Service: Endoscopy;;   CARDIAC CATHETERIZATION N/A 05/17/2015   Procedure: Right/Left Heart Cath and Coronary Angiography;  Surgeon: Jolaine Artist, MD;  Location: [facility];  Service: Cardiovascular;  Laterality: N/A;   COLONOSCOPY WITH PROPOFOL N/A 02/10/2021   Procedure: COLONOSCOPY WITH PROPOFOL;  Surgeon: Gatha Mayer, MD;  Location: [facility];  Service: Endoscopy;  Laterality: N/A;   EP IMPLANTABLE DEVICE N/A 05/17/2015   Procedure: SubQ ICD Implant;  Surgeon: Will Meredith Leeds, MD;  Location: [facility];  Service: Cardiovascular;  Laterality: N/A;   ESOPHAGOGASTRODUODENOSCOPY (EGD) WITH PROPOFOL N/A 02/10/2021   Procedure: ESOPHAGOGASTRODUODENOSCOPY (EGD) WITH PROPOFOL;  Surgeon: Gatha Mayer, MD;  [facility];  Service: Endoscopy;  Laterality: N/A;   ROBOTIC ASSISTED LAPAROSCOPIC LYSIS OF ADHESION  01/13/2013   Procedure: ROBOTIC ASSISTED LAPAROSCOPIC LYSIS OF ADHESION EXTENSIVE;  Surgeon: Alexis Frock, MD;  Location: [facility];  Service: Urology;;   ROBOTIC ASSITED PARTIAL NEPHRECTOMY Right 01/13/2013   Procedure: ROBOTIC ASSITED PARTIAL NEPHRECTOMY CONVERTED TO ROBOTIC ASSISTED RIGHT RADICAL NEPHRECTOMY;  Surgeon: Alexis Frock, MD;  Location: [facility];  Service: Urology;  Laterality: Right;   SUBQ ICD CHANGEOUT N/A 05/16/2020   Procedure: SUBQ ICD CHANGEOUT;  Surgeon: [name], MD;  Location: [facility];  Service: Cardiovascular;  Laterality: N/A;   SUBQ ICD CHANGEOUT N/A 05/15/2020   Procedure: SUBQ ICD CHANGEOUT;  Surgeon: [name], MD;  Location: [facility];  Service: Cardiovascular;  Laterality: N/A;    VASECTOMY         Family History  Problem Relation Age of Onset   Hypertension Mother    Diabetes Maternal Aunt    Heart attack Neg Hx    Stroke Neg Hx    Colon cancer Neg Hx    Pancreatic cancer Neg Hx    Stomach cancer Neg Hx    Liver cancer Neg Hx    Esophageal cancer Neg Hx     Social History   Tobacco Use   Smoking status: Every Day    Packs/day: 0.25    Years: 22.00    Pack years: 5.50    Types: Cigarettes    Last attempt to quit: 04/16/2015    Years since quitting: 5.9   Smokeless tobacco: Never  Vaping Use   Vaping Use: Never used  Substance Use Topics   Alcohol use: Yes    Comment: 7 drinks per week max   Drug use: Yes    Frequency: 14.0 times per week    Types: Marijuana  Comment: uses marijuana twice a day    Home Medications Prior to Admission medications   Medication Sig Start Date End Date Taking? Authorizing Provider  oxyCODONE (ROXICODONE) 5 MG immediate release tablet Take 1 tablet (5 mg total) by mouth every 6 (six) hours as needed for up to 10 doses for breakthrough pain. 04/12/21  Yes Dorisann Schwanke, DO  predniSONE (DELTASONE) 20 MG tablet Take 3 tablets (60 mg total) by mouth daily for 5 days. 04/12/21 04/17/21 Yes Brody Kump, DO  Accu-Chek FastClix Lancets MISC USE AS DIRECTED FOUR TIMES DAILY 01/29/21 01/29/22  Vevelyn Francois, NP  allopurinol (ZYLOPRIM) 100 MG tablet Take 1 tablet (100 mg total) by mouth daily. 01/09/21   Clegg, Amy D, NP  amiodarone (PACERONE) 100 MG tablet TAKE 1 TABLET (100 MG TOTAL) BY MOUTH DAILY. Patient taking differently: Take 100 mg by mouth daily. 01/09/21 01/09/22  Clegg, Amy D, NP  apixaban (ELIQUIS) 5 MG TABS tablet TAKE 1 TABLET (5 MG TOTAL) BY MOUTH 2 (TWO) TIMES DAILY. RESUME WITH EVENING DOSE ON SUNDAY Patient taking differently: Take 5 mg by mouth 2 (two) times daily. 01/09/21 01/09/22  Clegg, Amy D, NP  blood glucose meter kit and supplies Use up to four times daily as directed. 01/27/21   Vevelyn Francois, NP   busPIRone (BUSPAR) 10 MG tablet TAKE 1 TABLET (10 MG TOTAL) BY MOUTH 2 (TWO) TIMES DAILY. Patient taking differently: Take 10 mg by mouth 2 (two) times daily. 06/24/20 06/24/21  [name], FNP  carvedilol (COREG) 6.25 MG tablet TAKE 1 TABLET (6.25 MG TOTAL) BY MOUTH 2 (TWO) TIMES DAILY. Patient taking differently: Take 6.25 mg by mouth 2 (two) times daily. 08/07/20 08/07/21  Bensimhon, Shaune Pascal, MD  cetirizine (ZYRTEC) 10 MG tablet Take 1 tablet (10 mg total) by mouth daily. 06/27/20   [name], FNP  Continuous Blood Gluc Receiver (FREESTYLE LIBRE 14 DAY READER) DEVI 1 each by Does not apply route 3 (three) times daily between meals as needed. 03/19/20   [name], FNP  Continuous Blood Gluc Sensor (GUARDIAN SENSOR 3) MISC 1 Device by Does not apply route 3 (three) times daily between meals as needed. 03/25/20   [name], FNP  empagliflozin (JARDIANCE) 10 MG TABS tablet TAKE 1 TABLET (10 MG TOTAL) BY MOUTH DAILY BEFORE BREAKFAST. Patient taking differently: Take 10 mg by mouth daily before breakfast. 12/06/20 12/06/21  Rafael Bihari, FNP  famotidine (PEPCID) 40 MG tablet TAKE 1 TABLET (40 MG TOTAL) BY MOUTH DAILY AS NEEDED FOR HEARTBURN OR INDIGESTION. Patient taking differently: Take 40 mg by mouth daily as needed for indigestion or heartburn. 12/25/20 12/25/21  Gatha Mayer, MD  ferrous sulfate 325 (65 FE) MG tablet Take 1 tablet (325 mg total) by mouth daily with breakfast. 03/04/21   Gatha Mayer, MD  fluticasone [facility]) 50 MCG/ACT nasal spray Place 2 sprays into both nostrils daily. 09/18/19   [name], FNP  gabapentin (NEURONTIN) 300 MG capsule Take 1 capsule (300 mg total) by mouth 3 (three) times daily. 06/27/20   [name], FNP  glipiZIDE (GLUCOTROL) 10 MG tablet Take 1 tablet (10 mg total) by mouth 2 (two) times daily before a meal. 06/27/20   [name], FNP  insulin aspart (NOVOLOG) 100 UNIT/ML FlexPen INJECT 0-15 UNIT(S) INTO THE  SKIN THREE TIMES DAILY WITH MEALS VIA SLIDING SCALE Patient taking differently: Inject 0-15 Units into the skin 3 (three) times daily with meals.  11/15/20 11/15/21  [name], FNP  insulin NPH-regular Human (70-30) 100 UNIT/ML injection INJECT 38 UNITS INTO THE SKIN 2 (TWO) TIMES DAILY WITH A MEAL. Patient taking differently: Inject 38 Units into the skin 2 (two) times daily with a meal. 06/27/20 06/27/21  [name], FNP  Insulin Pen Needle 31G X 5 MM MISC USE AS DIRECTED 11/27/20 11/27/21  [name], FNP  Insulin Syringe-Needle U-100 31G X 5/16" 0.5 ML MISC USE TO INJECT INSULIN TWICE DAILY. 09/17/20 09/17/21  [name], FNP  Magnesium Oxide 200 MG TABS Take 100 mg by mouth daily.    [provider]  metFORMIN (GLUCOPHAGE) 1000 MG tablet Take 1 tablet (1,000 mg total) by mouth 2 (two) times daily with a meal. 01/09/21   Clegg, Amy D, NP  sacubitril-valsartan (ENTRESTO) 49-51 MG TAKE 1 TABLET BY MOUTH 2 (TWO) TIMES DAILY. 01/24/21   Clegg, Amy D, NP  spironolactone (ALDACTONE) 25 MG tablet Take 1 tablet (25 mg total) by mouth daily. 01/09/21 08/07/21  Clegg, Amy D, NP  torsemide (DEMADEX) 20 MG tablet TAKE 1 TABLET (20 MG TOTAL) BY MOUTH DAILY. Patient taking differently: Take 20 mg by mouth daily. 01/09/21 01/09/22  Clegg, Amy D, NP  traZODone (DESYREL) 100 MG tablet Take 1 tablet (100 mg total) by mouth at bedtime. For sleep 03/05/21   Vevelyn Francois, NP  Vitamin D, Ergocalciferol, (DRISDOL) 1.25 MG (50000 UNIT) CAPS capsule TAKE 1 CAPSULE (50,000 UNITS TOTAL) BY MOUTH ONCE A WEEK. Patient taking differently: Take 50,000 Units by mouth every Monday. 10/29/20 10/29/21  [name], FNP  Colchicine (MITIGARE) 0.6 MG CAPS Take 1 tablet by mouth 2 (two) times daily as needed (gout). 06/27/20 11/26/20  [name], FNP    Allergies    Aspirin, Bee venom, Lisinopril, and Tomato  Review of Systems   Review of Systems  Constitutional:  Negative for fever.   Respiratory:  Negative for shortness of breath.   Cardiovascular:  Negative for chest pain.  Gastrointestinal:  Negative for abdominal pain.  Musculoskeletal:  Positive for arthralgias.  Skin:  Negative for color change, rash and wound.  Neurological:  Negative for weakness, numbness and headaches.   Physical Exam Updated Vital Signs BP 118/83 (BP Location: Left Arm)   Pulse 82   Temp 99 F (37.2 C) (Oral)   Resp 16   Ht '5\' 7"'  (1.702 m)   Wt (!) 154.2 kg   SpO2 98%   BMI 53.25 kg/m   Physical Exam Vitals and nursing note reviewed.  Constitutional:      Appearance: He is well-developed.  Cardiovascular:     Rate and Rhythm: Normal rate and regular rhythm.     Pulses: Normal pulses.     Heart sounds: No murmur heard. Pulmonary:     Effort: Pulmonary effort is normal. No respiratory distress.     Breath sounds: Normal breath sounds.  Musculoskeletal:        General: Tenderness present. Normal range of motion.     Comments: Tenderness to MTPs of right hand and some tenderness to the right forearm but overall good range of motion of the right wrist and right fingers without much pain, there is no erythema or major swelling overlying the right upper extremity  Skin:    General: Skin is warm and dry.     Capillary Refill: Capillary refill takes less than 2 seconds.  Neurological:     General: No focal deficit present.  Mental Status: He is alert.     Sensory: No sensory deficit.     Motor: No weakness.    ED Results / Procedures / Treatments   Labs (all labs ordered are listed, but only abnormal results are displayed) Labs Reviewed - No data to display  EKG None  Radiology DG Wrist Complete Right  Result Date: 04/12/2021 CLINICAL DATA:  Hand pain, history of prior DVT in the right arm EXAM: RIGHT WRIST - COMPLETE 3+ VIEW COMPARISON:  None. FINDINGS: Mild degenerative changes are noted in the radiocarpal articulation. No acute fracture or dislocation is noted. Mild  soft tissue swelling is noted about the wrist. IMPRESSION: Mild degenerative change without acute bony abnormality. Electronically Signed   By: [name] Catalina M.D.   On: 04/12/2021 19:12   US Venous Img Upper Right (DVT Study)  Result Date: 04/12/2021 CLINICAL DATA:  Right hand pain and swelling for 1 day, initial encounter EXAM: RIGHT UPPER EXTREMITY VENOUS DOPPLER ULTRASOUND TECHNIQUE: Gray-scale sonography with graded compression, as well as color Doppler and duplex ultrasound were performed to evaluate the upper extremity deep venous system from the level of the subclavian vein and including the jugular, axillary, basilic, radial, ulnar and upper cephalic vein. Spectral Doppler was utilized to evaluate flow at rest and with distal augmentation maneuvers. COMPARISON:  None. FINDINGS: Contralateral Subclavian Vein: Respiratory phasicity is normal and symmetric with the symptomatic side. No evidence of thrombus. Normal compressibility. Internal Jugular Vein: No evidence of thrombus. Normal compressibility, respiratory phasicity and response to augmentation. Subclavian Vein: No evidence of thrombus. Normal compressibility, respiratory phasicity and response to augmentation. Axillary Vein: No evidence of thrombus. Normal compressibility, respiratory phasicity and response to augmentation. Cephalic Vein: No evidence of thrombus. Normal compressibility, respiratory phasicity and response to augmentation. Basilic Vein: No evidence of thrombus. Normal compressibility, respiratory phasicity and response to augmentation. Brachial Veins: No evidence of thrombus. Normal compressibility, respiratory phasicity and response to augmentation. Radial Veins: No evidence of thrombus. Normal compressibility, respiratory phasicity and response to augmentation. Ulnar Veins: No evidence of thrombus. Normal compressibility, respiratory phasicity and response to augmentation. Venous Reflux:  None visualized. Other Findings:  None  visualized. IMPRESSION: No evidence of DVT within the right upper extremity. Electronically Signed   By: [name] Catalina M.D.   On: 04/12/2021 19:05    Procedures Procedures   Medications Ordered in ED Medications  predniSONE (DELTASONE) tablet 60 mg (has no administration in time range)  oxyCODONE-acetaminophen (PERCOCET/ROXICET) 5-325 MG per tablet 1 tablet (1 tablet Oral Given 04/12/21 1858)    ED Course  I have reviewed the triage vital signs and the nursing notes.  Pertinent labs & imaging results that were available during my care of the patient were reviewed by me and considered in my medical decision making (see chart for details).    MDM Rules/Calculators/A&P                          [name] is here with right hand and wrist pain.  History of DVT in the right upper arm and is on Eliquis.  History of gout.  Recently was on a course of steroid for the same pain and had great improvement.  Pain has come back.  Neurovascular neuromuscularly intact on exam.  There is no warmth or erythema to the hand or wrist and no concern for septic joint.  Normal vitals.  No fever.  No obvious swelling or significant pain with range  of motion of the wrist of the fingers.  X-rays of the hand and wrist showed no fracture and no obvious joint inflammation.  DVT study of the right upper extremity was normal.  Overall suspect may be a mild gout or some other type of inflammatory arthritis.  Will prescribe an additional course of steroids.  Will prescribe oxycodone for breakthrough pain.  Recommend close follow-up with primary care doctor.  Discharged in ED in good condition.  This chart was dictated using voice recognition software.  Despite best efforts to proofread,  errors can occur which can change the documentation meaning.   Final Clinical Impression(s) / ED Diagnoses Final diagnoses:  Hand pain, right    Rx / DC Orders ED Discharge Orders          Ordered    oxyCODONE (ROXICODONE) 5 MG  immediate release tablet  Every 6 hours PRN        04/12/21 1955    predniSONE (DELTASONE) 20 MG tablet  Daily        04/12/21 1955             Lennice Sites, DO 04/12/21 1957

## 2021-04-12 NOTE — ED Notes (Signed)
Patient transported to X-ray 

## 2021-04-12 NOTE — Discharge Instructions (Addendum)
Recommend 1000 mg of Tylenol every 6 hours as needed for pain.  Take Roxicodone for breakthrough pain.  Take prednisone as prescribed but take your next dose tomorrow.  Follow-up with your primary care doctor.

## 2021-04-14 ENCOUNTER — Telehealth: Payer: Self-pay

## 2021-04-14 NOTE — Telephone Encounter (Signed)
Transition Care Management Follow-up Telephone Call Date of discharge and from where: 04/12/2021- [facility] How have you been since you were released from the hospital? Doing Fine  Any questions or concerns? No  Items Reviewed: Did the pt receive and understand the discharge instructions provided? Yes  Medications obtained and verified? Yes  Other? No  Any new allergies since your discharge? No  Dietary orders reviewed? N/A Do you have support at home? Yes   Home Care and Equipment/Supplies: Were home health services ordered? not applicable If so, what is the name of the agency? N/A  Has the agency set up a time to come to the patient's home? not applicable Were any new equipment or medical supplies ordered?  No What is the name of the medical supply agency? N/A Were you able to get the supplies/equipment? not applicable Do you have any questions related to the use of the equipment or supplies? No  Functional Questionnaire: (I = Independent and D = Dependent) ADLs: I  Bathing/Dressing- I  Meal Prep- I  Eating- I  Maintaining continence- I  Transferring/Ambulation- I  Managing Meds- I  Follow up appointments reviewed:  PCP Hospital f/u appt confirmed? Yes  Scheduled to see Dr. Owens Shark on 04/16/2021 @ [facility] f/u appt confirmed? No  Are transportation arrangements needed? No  If their condition worsens, is the pt aware to call PCP or go to the Emergency Dept.? Yes Was the patient provided with contact information for the PCP's office or ED? Yes Was to pt encouraged to call back with questions or concerns? Yes

## 2021-04-15 ENCOUNTER — Other Ambulatory Visit: Payer: Self-pay | Admitting: Obstetrics and Gynecology

## 2021-04-15 NOTE — Patient Instructions (Signed)
Hi Jeffrey [name], sorry I missed you today, I hope you are doing well  - as a part of your Medicaid benefit, you are eligible for care management and care coordination services at no cost or copay. I was unable to reach you by phone today but would be happy to help you with your health related needs. Please feel free to call me at [phone]  A member of the Managed Medicaid care management team will reach out to you again over the next 7 days.   [name] RN, BSN [place]  [facility] - Managed Medicaid High Risk [phone].

## 2021-04-15 NOTE — Patient Outreach (Signed)
Care Coordination  04/15/2021  Jeffrey [name] October 08, 1975 [id number]   Medicaid Managed Care   Unsuccessful Outreach Note  04/15/2021 Name: Jeffrey [name] MRN: [id number] DOB: Jan 11, 1976  Referred by: [name], FNP (Inactive) Reason for referral : High Risk Managed Medicaid (Unsuccessful telephone outreach)   A second unsuccessful telephone outreach was attempted today. The patient was referred to the case management team for assistance with care management and care coordination.   Follow Up Plan: The care management team will reach out to the patient again over the next 7 days.   [name] RN, BSN [place]  [facility] - Managed Medicaid High Risk [phone].

## 2021-04-16 ENCOUNTER — Other Ambulatory Visit: Payer: Self-pay

## 2021-04-16 ENCOUNTER — Ambulatory Visit (INDEPENDENT_AMBULATORY_CARE_PROVIDER_SITE_OTHER): Payer: Medicaid Other | Admitting: Nurse Practitioner

## 2021-04-16 VITALS — BP 124/91 | HR 86 | Temp 97.9°F | Resp 18

## 2021-04-16 DIAGNOSIS — M25531 Pain in right wrist: Secondary | ICD-10-CM | POA: Diagnosis not present

## 2021-04-16 MED ORDER — PREDNISONE 10 MG PO TABS
ORAL_TABLET | ORAL | 0 refills | Status: DC
  Filled 2021-04-16: qty 6, 4d supply, fill #0

## 2021-04-16 NOTE — Progress Notes (Signed)
_0  ID: Jeffrey [name], male    DOB: December 09, 1975, 45 y.o.   MRN: [id number]  Chief Complaint  Patient presents with   Hospitalization Follow-up    Referring provider: No ref. provider found   HPI  Patient presents today for hospital follow-up/transition of care.  Patient was seen in the ED on 04/12/2021.  He was seen for right wrist and arm pain.  He was prescribed prednisone and states that he is much improved at this point.  Patient has a recent history of blood clot to this arm.  He is currently on Eliquis.  He states that when he was in the hospital in May he had a line placed in that arm after the line was removed he developed a blood clot and has been having pain intermittently since that time.  He does have referral to see vascular.  We discussed that he could have some nerve damage as well.  We are going to refer him to neurology as well.  I will give him a prescription for a couple more days of prednisone to taper off of his current dose.  Patient does have full range of motion to his right arm wrist and hand.  He does have equal grips.  Denies f/c/s, n/v/d, hemoptysis, PND, chest pain or edema.      Allergies  Allergen Reactions   Aspirin Shortness Of Breath, Itching and Rash     Burning sensation (Patient reports he tolerates other NSAIDS)    Bee Venom Hives and Swelling   Lisinopril Cough   Tomato Rash    Immunization History  Administered Date(s) Administered   Influenza Split 10/24/2011   Influenza,inj,Quad PF,6+ Mos 07/23/2017, 09/06/2018, 09/18/2019   Pneumococcal Conjugate-13 11/01/2017   Pneumococcal Polysaccharide-23 10/24/2011   Tdap 04/07/2015    Past Medical History:  Diagnosis Date   Anxiety    Aspirin allergy    Childhood asthma    Chronic systolic CHF (congestive heart failure) ([place])    a. EF 20-25% in 2012. b. EF 45-50% in 10/2011 with nonischemic nuc - presumed NICM. c. 12/2014 Echo: Sev depressed LV fxn, sev dil LV, mild LVH, mild MR, sev dil LA,  mildly reduced RV fxn.   CKD (chronic kidney disease) stage 2, GFR 60-89 ml/min    H/O vasectomy 12/2019   High cholesterol    Hypertension    Morbid obesity ([place])    Nephrolithiasis    OSA on CPAP    Paroxysmal atrial fibrillation ([place])    Presumed NICM    a. 04/2014 Myoview: EF 26%, glob HK, sev glob HK, ? prior infarct;  b. Never cathed 2/2 CKD.   Renal cell carcinoma ([place])    a. s/p Rt robotic assisted partial converted to radical nephrectomy on 01/2013.   Troponin level elevated    a. 04/2014, 12/2014: felt due to CHF.   Type II diabetes mellitus (H[facility])    Ventricular tachycardia ([place])    a. appropriate ICD therapy 12/2017    Tobacco History: Social History   Tobacco Use  Smoking Status Every Day   Packs/day: 0.25   Years: 22.00   Pack years: 5.50   Types: Cigarettes   Last attempt to quit: 04/16/2015   Years since quitting: 6.0  Smokeless Tobacco Never   Ready to quit: Not Answered Counseling given: Yes   Outpatient Encounter Medications as of 04/16/2021  Medication Sig   predniSONE (DELTASONE) 10 MG tablet take 2 tablets by mouth for 2 days, then 1 tab for  2 days, then stop   Accu-Chek FastClix Lancets MISC USE AS DIRECTED FOUR TIMES DAILY   allopurinol (ZYLOPRIM) 100 MG tablet Take 1 tablet (100 mg total) by mouth daily.   amiodarone (PACERONE) 100 MG tablet TAKE 1 TABLET (100 MG TOTAL) BY MOUTH DAILY. (Patient taking differently: Take 100 mg by mouth daily.)   apixaban (ELIQUIS) 5 MG TABS tablet TAKE 1 TABLET (5 MG TOTAL) BY MOUTH 2 (TWO) TIMES DAILY. RESUME WITH EVENING DOSE ON SUNDAY (Patient taking differently: Take 5 mg by mouth 2 (two) times daily.)   blood glucose meter kit and supplies Use up to four times daily as directed.   busPIRone (BUSPAR) 10 MG tablet TAKE 1 TABLET (10 MG TOTAL) BY MOUTH 2 (TWO) TIMES DAILY. (Patient taking differently: Take 10 mg by mouth 2 (two) times daily.)   carvedilol (COREG) 6.25 MG tablet TAKE 1 TABLET (6.25 MG TOTAL) BY MOUTH 2  (TWO) TIMES DAILY. (Patient taking differently: Take 6.25 mg by mouth 2 (two) times daily.)   cetirizine (ZYRTEC) 10 MG tablet Take 1 tablet (10 mg total) by mouth daily.   Continuous Blood Gluc Receiver (FREESTYLE LIBRE 14 DAY READER) DEVI 1 each by Does not apply route 3 (three) times daily between meals as needed.   Continuous Blood Gluc Sensor (GUARDIAN SENSOR 3) MISC 1 Device by Does not apply route 3 (three) times daily between meals as needed.   empagliflozin (JARDIANCE) 10 MG TABS tablet TAKE 1 TABLET (10 MG TOTAL) BY MOUTH DAILY BEFORE BREAKFAST. (Patient taking differently: Take 10 mg by mouth daily before breakfast.)   famotidine (PEPCID) 40 MG tablet TAKE 1 TABLET (40 MG TOTAL) BY MOUTH DAILY AS NEEDED FOR HEARTBURN OR INDIGESTION. (Patient taking differently: Take 40 mg by mouth daily as needed for indigestion or heartburn.)   ferrous sulfate 325 (65 FE) MG tablet Take 1 tablet (325 mg total) by mouth daily with breakfast.   fluticasone (FLONASE) 50 MCG/ACT nasal spray Place 2 sprays into both nostrils daily.   gabapentin (NEURONTIN) 300 MG capsule Take 1 capsule (300 mg total) by mouth 3 (three) times daily.   glipiZIDE (GLUCOTROL) 10 MG tablet Take 1 tablet (10 mg total) by mouth 2 (two) times daily before a meal.   insulin aspart (NOVOLOG) 100 UNIT/ML FlexPen INJECT 0-15 UNIT(S) INTO THE SKIN THREE TIMES DAILY WITH MEALS VIA SLIDING SCALE (Patient taking differently: Inject 0-15 Units into the skin 3 (three) times daily with meals.)   insulin NPH-regular Human (70-30) 100 UNIT/ML injection INJECT 38 UNITS INTO THE SKIN 2 (TWO) TIMES DAILY WITH A MEAL. (Patient taking differently: Inject 38 Units into the skin 2 (two) times daily with a meal.)   Insulin Pen Needle 31G X 5 MM MISC USE AS DIRECTED   Insulin Syringe-Needle U-100 31G X 5/16" 0.5 ML MISC USE TO INJECT INSULIN TWICE DAILY.   Magnesium Oxide 200 MG TABS Take 100 mg by mouth daily.   metFORMIN (GLUCOPHAGE) 1000 MG tablet Take 1  tablet (1,000 mg total) by mouth 2 (two) times daily with a meal.   oxyCODONE (ROXICODONE) 5 MG immediate release tablet Take 1 tablet (5 mg total) by mouth every 6 (six) hours as needed for up to 10 doses for breakthrough pain.   predniSONE (DELTASONE) 20 MG tablet Take 3 tablets (60 mg total) by mouth daily for 5 days.   sacubitril-valsartan (ENTRESTO) 49-51 MG TAKE 1 TABLET BY MOUTH 2 (TWO) TIMES DAILY.   spironolactone (ALDACTONE) 25 MG tablet Take 1  tablet (25 mg total) by mouth daily.   torsemide (DEMADEX) 20 MG tablet TAKE 1 TABLET (20 MG TOTAL) BY MOUTH DAILY. (Patient taking differently: Take 20 mg by mouth daily.)   traZODone (DESYREL) 100 MG tablet Take 1 tablet (100 mg total) by mouth at bedtime. For sleep   Vitamin D, Ergocalciferol, (DRISDOL) 1.25 MG (50000 UNIT) CAPS capsule TAKE 1 CAPSULE (50,000 UNITS TOTAL) BY MOUTH ONCE A WEEK. (Patient taking differently: Take 50,000 Units by mouth every Monday.)   [DISCONTINUED] Colchicine (MITIGARE) 0.6 MG CAPS Take 1 tablet by mouth 2 (two) times daily as needed (gout).   No facility-administered encounter medications on file as of 04/16/2021.     Review of Systems  Review of Systems  Constitutional: Negative.   HENT: Negative.    Respiratory:  Negative for cough and shortness of breath.   Cardiovascular: Negative.   Gastrointestinal: Negative.   Musculoskeletal: Negative.   Allergic/Immunologic: Negative.   Neurological: Negative.   Psychiatric/Behavioral: Negative.        Physical Exam  BP (!) 124/91   Pulse 86   Temp 97.9 F (36.6 C)   Resp 18   SpO2 95%   Wt Readings from Last 5 Encounters:  04/12/21 (!) 340 lb (154.2 kg)  02/10/21 (!) 324 lb 15.3 oz (147.4 kg)  01/09/21 (!) 330 lb 12.8 oz (150 kg)  12/31/20 (!) 332 lb (150.6 kg)  12/25/20 (!) 330 lb (149.7 kg)     Physical Exam Vitals and nursing note reviewed.  Constitutional:      General: He is not in acute distress.    Appearance: He is well-developed.   Cardiovascular:     Rate and Rhythm: Normal rate and regular rhythm.  Pulmonary:     Effort: Pulmonary effort is normal.     Breath sounds: Normal breath sounds.  Musculoskeletal:     Right wrist: No swelling or tenderness. Normal range of motion.  Skin:    General: Skin is warm and dry.  Neurological:     Mental Status: He is alert and oriented to person, place, and time.     Lab Results:  CBC    Component Value Date/Time   WBC 7.9 12/25/2020 1456   RBC 5.38 12/25/2020 1456   HGB 13.9 12/25/2020 1456   HGB 12.8 (L) 10/28/2020 0932   HCT 42.1 12/25/2020 1456   HCT 39.8 10/28/2020 0932   PLT 318.0 12/25/2020 1456   PLT 292 10/28/2020 0932   MCV 78.2 12/25/2020 1456   MCV 79 10/28/2020 0932   MCH 25.9 (L) 11/22/2020 0820   MCHC 32.9 12/25/2020 1456   RDW 17.2 (H) 12/25/2020 1456   RDW 15.1 10/28/2020 0932   LYMPHSABS 2.2 12/25/2020 1456   LYMPHSABS 2.9 10/28/2020 0932   MONOABS 0.5 12/25/2020 1456   EOSABS 0.1 12/25/2020 1456   EOSABS 0.2 10/28/2020 0932   BASOSABS 0.1 12/25/2020 1456   BASOSABS 0.1 10/28/2020 0932    BMET    Component Value Date/Time   NA 135 01/16/2021 1041   NA 138 09/04/2020 1039   K 4.5 01/16/2021 1041   CL 104 01/16/2021 1041   CO2 23 01/16/2021 1041   GLUCOSE 209 (H) 01/16/2021 1041   BUN 29 (H) 01/16/2021 1041   BUN 23 09/04/2020 1039   CREATININE 1.49 (H) 01/16/2021 1041   CALCIUM 9.8 01/16/2021 1041   GFRNONAA 58 (L) 01/16/2021 1041   GFRAA 66 09/04/2020 1039    BNP    Component Value Date/Time  BNP 65.0 11/21/2020 2222    ProBNP    Component Value Date/Time   PROBNP 163.0 (H) 07/05/2014 0906    Imaging: DG Wrist Complete Right  Result Date: 04/12/2021 CLINICAL DATA:  Hand pain, history of prior DVT in the right arm EXAM: RIGHT WRIST - COMPLETE 3+ VIEW COMPARISON:  None. FINDINGS: Mild degenerative changes are noted in the radiocarpal articulation. No acute fracture or dislocation is noted. Mild soft tissue swelling  is noted about the wrist. IMPRESSION: Mild degenerative change without acute bony abnormality. Electronically Signed   By: [name] Catalina M.D.   On: 04/12/2021 19:12   US Venous Img Upper Right (DVT Study)  Result Date: 04/12/2021 CLINICAL DATA:  Right hand pain and swelling for 1 day, initial encounter EXAM: RIGHT UPPER EXTREMITY VENOUS DOPPLER ULTRASOUND TECHNIQUE: Gray-scale sonography with graded compression, as well as color Doppler and duplex ultrasound were performed to evaluate the upper extremity deep venous system from the level of the subclavian vein and including the jugular, axillary, basilic, radial, ulnar and upper cephalic vein. Spectral Doppler was utilized to evaluate flow at rest and with distal augmentation maneuvers. COMPARISON:  None. FINDINGS: Contralateral Subclavian Vein: Respiratory phasicity is normal and symmetric with the symptomatic side. No evidence of thrombus. Normal compressibility. Internal Jugular Vein: No evidence of thrombus. Normal compressibility, respiratory phasicity and response to augmentation. Subclavian Vein: No evidence of thrombus. Normal compressibility, respiratory phasicity and response to augmentation. Axillary Vein: No evidence of thrombus. Normal compressibility, respiratory phasicity and response to augmentation. Cephalic Vein: No evidence of thrombus. Normal compressibility, respiratory phasicity and response to augmentation. Basilic Vein: No evidence of thrombus. Normal compressibility, respiratory phasicity and response to augmentation. Brachial Veins: No evidence of thrombus. Normal compressibility, respiratory phasicity and response to augmentation. Radial Veins: No evidence of thrombus. Normal compressibility, respiratory phasicity and response to augmentation. Ulnar Veins: No evidence of thrombus. Normal compressibility, respiratory phasicity and response to augmentation. Venous Reflux:  None visualized. Other Findings:  None visualized. IMPRESSION: No  evidence of DVT within the right upper extremity. Electronically Signed   By: [name] Catalina M.D.   On: 04/12/2021 19:05   DG Hand Complete Right  Result Date: 04/12/2021 CLINICAL DATA:  Hand pain, history of prior DVT. EXAM: RIGHT HAND - COMPLETE 3+ VIEW COMPARISON:  None. FINDINGS: Mild degenerative changes of the radiocarpal joint are seen. No acute fracture or dislocation is noted. No soft tissue abnormality is seen. IMPRESSION: No acute abnormality noted. Electronically Signed   By: [name] Catalina M.D.   On: 04/12/2021 19:22     Assessment & Plan:   Right wrist pain Right wrist and arm pain:  Concerned for possible nerve damage  Will place referral to neurology  Complete entire course of prednisone - will send in 6 additional tablets to taper down     Fenton Foy, NP 04/16/2021

## 2021-04-16 NOTE — Assessment & Plan Note (Signed)
Right wrist and arm pain:  Concerned for possible nerve damage  Will place referral to neurology  Complete entire course of prednisone - will send in 6 additional tablets to taper down

## 2021-04-16 NOTE — Patient Instructions (Addendum)
Right wrist and arm pain:  Concerned for possible nerve damage  Will place referral to neurology  Complete entire course of prednisone - will send in 6 additional tablets to taper down   Wrist Pain, Adult There are many things that can cause wrist pain. Some common causes include: An injury to the wrist. Using the joint too much. A condition that causes too much pressure to be put on a nerve in the wrist (carpal tunnel syndrome). Wear and tear of the joints that happens as a person gets older (osteoarthritis). A condition that causes swelling and stiffness in the joints (arthritis). Sometimes, the cause of wrist pain is not known. Often, the pain goes away when you follow your doctor's instructions for easing pain at home. This may include resting your wrist, icing your wrist, or using a splint or an elastic wrap for a short time. It is important to tell your doctorif your wrist pain does not go away. Follow these instructions at home: If you have a splint or elastic wrap: Wear the splint or wrap as told by your doctor. Take it off only as told by your doctor. Ask if you can take it off for bathing. Loosen the splint or wrap if your fingers: Tingle. Become numb. Turn cold and blue. Check the skin around the splint or wrap every day. Tell your doctor about any concerns. Keep the splint or wrap clean. If the splint or wrap is not waterproof: Do not let it get wet. Cover it with a watertight covering when you take a bath or shower. Managing pain, stiffness, and swelling  If told, put ice on the painful area. To do this: If you have a removable splint or wrap, take it off as told by your doctor. Put ice in a plastic bag. Place a towel between your skin and the bag or between your splint or wrap and the bag. Leave the ice on for 20 minutes, 2-3 times a day. Move your fingers often. Raise (elevate) the injured area above the level of your heart while you are sitting or lying  down.  Activity Rest your wrist as told by your doctor. Return to your normal activities as told by your doctor. Ask your doctor what activities are safe for you. Ask your doctor when it is safe to drive if you have a splint or wrap on your wrist. Do exercises as told by your doctor. General instructions Pay attention to any changes in your symptoms. Take over-the-counter and prescription medicines only as told by your doctor. Keep all follow-up visits as told by your doctor. This is important. Contact a doctor if: You have a sudden, sharp pain in the wrist, hand, or arm that is different or new. The swelling or bruising on your wrist or hand gets worse. Your skin: Becomes red. Gets a rash. Has open sores. Your pain does not get better. Your pain gets worse. You have a fever or chills. Get help right away if: You lose feeling in your fingers or hand. Your fingers turn white, very red, or cold and blue. You cannot move your fingers. Summary There are many things that can cause wrist pain. It is important to tell your doctor if your wrist pain does not go away. You may need to wear a splint or a wrap for a short period of time. Return to your normal activities as told by your doctor. Ask your doctor what activities are safe for you. This information is not intended  to replace advice given to you by your health care provider. Make sure you discuss any questions you have with your healthcare provider. Document Revised: 08/10/2019 Document Reviewed: 08/10/2019 Elsevier Patient Education  [place].    Follow up:  Will schedule appointment for new PCP

## 2021-04-17 ENCOUNTER — Ambulatory Visit (INDEPENDENT_AMBULATORY_CARE_PROVIDER_SITE_OTHER): Payer: Self-pay | Admitting: Bariatrics

## 2021-04-18 ENCOUNTER — Other Ambulatory Visit: Payer: Self-pay

## 2021-04-22 ENCOUNTER — Other Ambulatory Visit: Payer: Self-pay

## 2021-04-22 ENCOUNTER — Other Ambulatory Visit: Payer: Self-pay | Admitting: Nurse Practitioner

## 2021-04-22 DIAGNOSIS — R7303 Prediabetes: Secondary | ICD-10-CM

## 2021-04-22 DIAGNOSIS — E119 Type 2 diabetes mellitus without complications: Secondary | ICD-10-CM

## 2021-04-22 DIAGNOSIS — R739 Hyperglycemia, unspecified: Secondary | ICD-10-CM

## 2021-04-22 MED ORDER — GLIPIZIDE 10 MG PO TABS
10.0000 mg | ORAL_TABLET | Freq: Two times a day (BID) | ORAL | 3 refills | Status: DC
  Filled 2021-04-22: qty 180, 90d supply, fill #0

## 2021-04-22 MED FILL — Buspirone HCl Tab 10 MG: ORAL | 30 days supply | Qty: 60 | Fill #1 | Status: AC

## 2021-04-28 ENCOUNTER — Other Ambulatory Visit: Payer: Self-pay | Admitting: Obstetrics and Gynecology

## 2021-04-28 ENCOUNTER — Other Ambulatory Visit: Payer: Self-pay

## 2021-04-28 NOTE — Patient Instructions (Signed)
Hi Mr. Sorby, thank you for speaking with me today.  Mr. Raia was given information about Medicaid Managed Care team care coordination services as a part of their Healthy [facility] Medicaid benefit. [name] verbally consented to engagement with the [facility] Managed Care team.   If you are experiencing a medical emergency, please call 911 or report to your local emergency department or urgent care.   If you have a non-emergency medical problem during routine business hours, please contact your provider's office and ask to speak with a nurse.   For questions related to your Healthy [facility] health plan, please call: [phone] or visit the homepage here: [email or web address]  If you would like to schedule transportation through your Healthy [facility] plan, please call the following number at least 2 days in advance of your appointment: [phone]  Call the [place] at [phone], at any time, 24 hours a day, 7 days a week. If you are in danger or need immediate medical attention call 911.  If you would like help to quit smoking, call 1-800-QUIT-NOW 435 278 1499) OR Espaol: 1-855-Djelo-Ya HD:1601594) o para ms informacin haga clic aqu or Text READY to 200-400 to register via text  Mr. Amato - following are the goals we discussed in your visit today:   Goals Addressed             This Visit's Progress    Protect My Health       Timeframe:  Long-Range Goal Priority:  High Start Date:    09/24/20                         Expected End Date:  ongoing           - schedule recommended health tests  - schedule and keep appointment for annual check-up  Update 11/28/20:Patient recently discharged from hospital 11/26/20-follow up appointments scheduled. Update 12/27/20:  Patient with no complaints today-scheduled for colonoscopy 02/05/21-rectal bleeding. Update 01/22/21:  Patient attending all scheduled appointments.   Annual check up completed 12/31/20 with Ms. Clovis Riley, NP. Update 04/28/21:  Patient has upcoming appointments scheduled with Neurology and Cardiology.     Patient verbalizes understanding of instructions provided today.   The Managed Medicaid care management team will reach out to the patient again over the next 30 days.  The  Patient has been provided with contact information for the Managed Medicaid care management team and has been advised to call with any health related questions or concerns.   [name] RN, BSN [place]  [facility] - Managed Medicaid High Risk [phone].  Following is a copy of your plan of care:  Patient Care Plan: General Plan of Care (Adult)     Problem Identified: Health Promotion or Disease Self-Management (General Plan of Care)   Priority: High  Onset Date: 09/24/2020  Note:   Current Barriers:  Chronic Disease Management support and education needs, CHF, HTN, CKD, OSA, gout, DM2, h/o renal cell carcinoma. Today, 04/28/21, having right hand and wist pain-neuro referral placed by provider.  Nurse Case Manager Clinical Goal(s):  Over the next 90 days, patient will attend all scheduled medical appointments:   Interventions:  Inter-disciplinary care team collaboration (see longitudinal plan of care) Evaluation of current treatment plan and patient's adherence to plan as established by provider.  Update 11/28/20:  Continue monitoring BP daily and reporting readings outside of parameters. Update 12/27/20:  Blood pressure 128/80. Update 04/28/21:  Patient not currently checking blood pressure. Reviewed medications with patient. Discussed plans with patient for ongoing care management follow up and provided patient with direct contact information for care management team Provided patient with educational materials related to exercise. Reviewed scheduled/upcoming provider appointments. Update 11/28/20:  Discussed low  glycemic vs high glycemic foods.  Pharmacy referral made for review of medications. Pharmacy referral for medication review. Update 01/22/21:  Pharmacy review completed 01/13/21, follow up 05/15/21.  Patient Goals/Self-Care Activities Over the next 90 days, patient will:  -Attends all scheduled provider appointments Calls provider office for new concerns or questions  Follow Up Plan: The Managed Medicaid care management team will reach out to the patient again over the next 30 days.  The patient has been provided with contact information for the Managed Medicaid care management team and has been advised to call with any health related questions or concerns.

## 2021-04-28 NOTE — Patient Outreach (Signed)
Medicaid Managed Care   Nurse Care Manager Note  04/28/2021 Name:  Jeffrey [name] MRN:  [id number] DOB:  [birth year]  Jeffrey [name] is an 45 y.o. year old male who is a primary patient of [name], FNP (Inactive).  [facility] Managed Care Coordination team was consulted for assistance with:    Chronic healthcare management needs, CHF, HTN, CKD, OSA, Gout, DM2, h/o renal cell carcinoma.  [name] was given information about Medicaid Managed Care Coordination team services today. Jeffrey [name] agreed to services and verbal consent obtained.  Engaged with patient by telephone for follow up visit in response to provider referral for case management and/or care coordination services.   Assessments/Interventions:  Review of past medical history, allergies, medications, health status, including review of consultants reports, laboratory and other test data, was performed as part of comprehensive evaluation and provision of chronic care management services.  SDOH (Social Determinants of Health) assessments and interventions performed:   Care Plan  Allergies  Allergen Reactions   Aspirin Shortness Of Breath, Itching and Rash     Burning sensation (Patient reports he tolerates other NSAIDS)    Bee Venom Hives and Swelling   Lisinopril Cough   Tomato Rash    Medications Reviewed Today     Reviewed by Gayla Medicus, RN (Registered Nurse) on 04/28/21 at 1348  Med List Status: <None>   Medication Order Taking? Sig Documenting Provider Last Dose Status Informant  Accu-Chek FastClix Lancets MISC [id number] No USE AS DIRECTED FOUR TIMES DAILY Vevelyn Francois, NP Past Week Unknown time Active   allopurinol (ZYLOPRIM) 100 MG tablet [id number] No Take 1 tablet (100 mg total) by mouth daily. Darrick Grinder D, NP Past Week Unknown time Active Self  amiodarone (PACERONE) 100 MG tablet [id number] No TAKE 1 TABLET (100 MG TOTAL) BY MOUTH DAILY.  Patient taking differently: Take 100 mg by mouth daily.    Clegg, Amy D, NP Past Week Unknown time Active   apixaban (ELIQUIS) 5 MG TABS tablet [id number] No TAKE 1 TABLET (5 MG TOTAL) BY MOUTH 2 (TWO) TIMES DAILY. RESUME WITH EVENING DOSE ON SUNDAY  Patient taking differently: Take 5 mg by mouth 2 (two) times daily.   Clegg, Amy D, NP 02/06/2021 Active   blood glucose meter kit and supplies [id number]  Use up to four times daily as directed. Vevelyn Francois, NP  Active Self  busPIRone (BUSPAR) 10 MG tablet [id number] No TAKE 1 TABLET (10 MG TOTAL) BY MOUTH 2 (TWO) TIMES DAILY.  Patient taking differently: Take 10 mg by mouth 2 (two) times daily.   [name], FNP Past Week Unknown time Active   carvedilol (COREG) 6.25 MG tablet [id number] No TAKE 1 TABLET (6.25 MG TOTAL) BY MOUTH 2 (TWO) TIMES DAILY.  Patient taking differently: Take 6.25 mg by mouth 2 (two) times daily.   Bensimhon, Shaune Pascal, MD Past Week Unknown time Active   cetirizine (ZYRTEC) 10 MG tablet [id number] No Take 1 tablet (10 mg total) by mouth daily. [name], FNP 02/09/2021 Unknown time Active Self  Discontinued 11/26/20 1333 (Stop Taking at Discharge) Continuous Blood Gluc Receiver (FREESTYLE LIBRE 14 DAY READER) DEVI [id number] No 1 each by Does not apply route 3 (three) times daily between meals as needed. [name], FNP Past Week Unknown time Active Self  Continuous Blood Gluc Sensor (GUARDIAN SENSOR 3) MISC [id number] No 1 Device by Does not apply route 3 (three) times daily between meals as  needed. [name], FNP Taking Active Self  empagliflozin (JARDIANCE) 10 MG TABS tablet [id number] No TAKE 1 TABLET (10 MG TOTAL) BY MOUTH DAILY BEFORE BREAKFAST.  Patient taking differently: Take 10 mg by mouth daily before breakfast.   Rafael Bihari, FNP Past Week Unknown time Active   famotidine (PEPCID) 40 MG tablet [id number] No TAKE 1 TABLET (40 MG TOTAL) BY MOUTH DAILY AS NEEDED FOR HEARTBURN OR INDIGESTION.  Patient taking differently: Take 40 mg by mouth daily as  needed for indigestion or heartburn.   Gatha Mayer, MD Past Week Unknown time Active   ferrous sulfate 325 (65 FE) MG tablet [id number]  Take 1 tablet (325 mg total) by mouth daily with breakfast. Gatha Mayer, MD  Active   fluticasone [facility]) 50 MCG/ACT nasal spray [id number] No Place 2 sprays into both nostrils daily. [name], FNP 02/09/2021 Unknown time Active Self           Med Note Dimitri Ped, AMANDA L   Fri Dec 06, 2020 12:07 PM)    gabapentin (NEURONTIN) 300 MG capsule [id number] No Take 1 capsule (300 mg total) by mouth 3 (three) times daily. [name], FNP Past Week Unknown time Active Self  glipiZIDE (GLUCOTROL) 10 MG tablet [id number]  Take 1 tablet (10 mg total) by mouth 2 (two) times daily before a meal. Vevelyn Francois, NP  Active   insulin aspart (NOVOLOG) 100 UNIT/ML FlexPen [id number] No INJECT 0-15 UNIT(S) INTO THE SKIN THREE TIMES DAILY WITH MEALS VIA SLIDING SCALE  Patient taking differently: Inject 0-15 Units into the skin 3 (three) times daily with meals.   [name], FNP Past Week Unknown time Active   insulin NPH-regular Human (70-30) 100 UNIT/ML injection [id number] No INJECT 38 UNITS INTO THE SKIN 2 (TWO) TIMES DAILY WITH A MEAL.  Patient taking differently: Inject 38 Units into the skin 2 (two) times daily with a meal.   [name], FNP Past Week Unknown time Active   Insulin Pen Needle 31G X 5 MM MISC [id number] No USE AS DIRECTED [name], FNP Past Week Unknown time Active Self  Insulin Syringe-Needle U-100 31G X 5/16" 0.5 ML MISC [id number] No USE TO INJECT INSULIN TWICE DAILY. [name], FNP Past Week Unknown time Active Self  Magnesium Oxide 200 MG TABS [id number] No Take 100 mg by mouth daily. [provider] Past Week Unknown time Active Self  metFORMIN (GLUCOPHAGE) 1000 MG tablet [id number] No Take 1 tablet (1,000 mg total) by mouth 2 (two) times daily with a meal. Clegg, Amy D, NP Past Week Unknown time Active  Self  oxyCODONE (ROXICODONE) 5 MG immediate release tablet [id number]  Take 1 tablet (5 mg total) by mouth every 6 (six) hours as needed for up to 10 doses for breakthrough pain. Lennice Sites, DO  Active   predniSONE (DELTASONE) 10 MG tablet [id number]  take 2 tablets by mouth for 2 days, then 1 tab for 2 days, then stop Fenton Foy, NP  Active   sacubitril-valsartan (ENTRESTO) 49-51 MG [id number] No TAKE 1 TABLET BY MOUTH 2 (TWO) TIMES DAILY. Clegg, Amy D, NP Past Week Unknown time Active Self  spironolactone (ALDACTONE) 25 MG tablet [id number] No Take 1 tablet (25 mg total) by mouth daily. Clegg, Amy D, NP Past Week Unknown time Active Self  torsemide (DEMADEX) 20 MG tablet [id number] No TAKE 1 TABLET (20 MG TOTAL) BY MOUTH DAILY.  Patient taking differently: Take 20 mg  by mouth daily.   Darrick Grinder D, NP Past Week Unknown time Active   traZODone (DESYREL) 100 MG tablet [id number]  Take 1 tablet (100 mg total) by mouth at bedtime. For sleep Vevelyn Francois, NP  Active   Vitamin D, Ergocalciferol, (DRISDOL) 1.25 MG (50000 UNIT) CAPS capsule [id number] No TAKE 1 CAPSULE (50,000 UNITS TOTAL) BY MOUTH ONCE A WEEK.  Patient taking differently: Take 50,000 Units by mouth every Monday.   [name], FNP Past Week Unknown time Active Self            Patient Active Problem List   Diagnosis Date Noted   Right wrist pain 04/16/2021   Absolute anemia    Rectal bleeding    Iron deficiency 12/25/2020   Long term current use of anticoagulant - apixaban 12/25/2020   Paroxysmal atrial fibrillation (HCC)    Lactic acidosis 11/22/2020   Hypotension 11/22/2020   Aspiration into airway    ICD (implantable cardioverter-defibrillator) battery depletion 05/15/2020   Endotracheal tube present    Respiratory failure ([place])    Acute encephalopathy    Acute pulmonary edema (HCC)    NICM (nonischemic cardiomyopathy) ([place]) 04/24/2020   S/P vasectomy 12/20/2019   Anxiety 12/20/2019   Hemoglobin A1C  between 7% and 9% indicating borderline diabetic control 12/20/2019   Seasonal allergies 12/20/2019   Insomnia 12/20/2019   Vasectomy evaluation 06/20/2019   Visual problems 06/20/2019   Blurry vision, bilateral 06/20/2019   Hyperglycemia 02/13/2019   Hyperosmolar (nonketotic) coma ([place]) 01/19/2019   AICD (automatic cardioverter/defibrillator) present 01/19/2019   Hyperlipidemia 12/07/2018   Follow-up exam 11/22/2018   Class 3 severe obesity due to excess calories with serious comorbidity and body mass index (BMI) of 45.0 to 49.9 in adult ([place]) 11/22/2018   Dizziness 11/22/2018   Lingular pneumonia 08/04/2018   Ventricular tachycardia ([place]) 12/30/2017   PAF (paroxysmal atrial fibrillation) ([place])    Dilated cardiomyopathy ([place])    Pollen allergy 02/17/2017   Dyslipidemia 02/17/2017   Hematochezia 11/08/2015   CKD (chronic kidney disease), stage III ([place]) 06/20/2015   Cardiac defibrillator in situ    Chronic systolic CHF (congestive heart failure) ([place]) 03/11/2015   Gouty arthritis 03/11/2015   Chest pain 12/28/2014   AKI (acute kidney injury) ([place]) 04/08/2014   Aspirin allergy 04/08/2014   Renal cell carcinoma ([place])    Gout attack 11/10/2011   Morbid obesity ([place]) 10/23/2011   OSA (obstructive sleep apnea) 10/23/2011   Type 2 diabetes mellitus with stage 3 chronic kidney disease, with long-term current use of insulin ([place]) 10/22/2011   Hyperlipidemia associated with type 2 diabetes mellitus ([place]) 12/25/2010   Hypertension associated with diabetes ([place]) 12/25/2010    Conditions to be addressed/monitored per PCP order:  Chronic healthcare management needs, CHF, HTN, CKD, OSA, Gout, DM2, h/o renal cell carcinoma.  Care Plan : General Plan of Care (Adult)  Updates made by Gayla Medicus, RN since 04/28/2021 12:00 AM     Problem: Health Promotion or Disease Self-Management (General Plan of Care)   Priority: High  Onset Date: 09/24/2020  Note:   Current Barriers:  Chronic  Disease Management support and education needs, CHF, HTN, CKD, OSA, gout, DM2, h/o renal cell carcinoma. Today, 04/28/21, having right hand and wist pain-neuro referral placed by provider.  Nurse Case Manager Clinical Goal(s):  Over the next 90 days, patient will attend all scheduled medical appointments:   Interventions:  Inter-disciplinary care team collaboration (see longitudinal plan of care) Evaluation of current treatment  plan and patient's adherence to plan as established by provider.  Update 11/28/20:  Continue monitoring BP daily and reporting readings outside of parameters. Update 12/27/20:  Blood pressure 128/80. Update 04/28/21:  Patient not currently checking blood pressure. Reviewed medications with patient. Discussed plans with patient for ongoing care management follow up and provided patient with direct contact information for care management team Provided patient with educational materials related to exercise. Reviewed scheduled/upcoming provider appointments. Update 11/28/20:  Discussed low glycemic vs high glycemic foods.  Pharmacy referral made for review of medications. Pharmacy referral for medication review. Update 01/22/21:  Pharmacy review completed 01/13/21, follow up 05/15/21.  Patient Goals/Self-Care Activities Over the next 90 days, patient will:  -Attends all scheduled provider appointments Calls provider office for new concerns or questions  Follow Up Plan: The Managed Medicaid care management team will reach out to the patient again over the next 30 days.  The patient has been provided with contact information for the Managed Medicaid care management team and has been advised to call with any health related questions or concerns.     Follow Up:  Patient agrees to Care Plan and Follow-up.  Plan: The Managed Medicaid care management team will reach out to the patient again over the next 30 days. and The patient has been provided with contact information for the  Managed Medicaid care management team and has been advised to call with any health related questions or concerns.  Date/time of next scheduled RN care management/care coordination outreach: 05/29/21 at 1245.

## 2021-04-29 ENCOUNTER — Inpatient Hospital Stay (HOSPITAL_COMMUNITY)
Admission: EM | Admit: 2021-04-29 | Discharge: 2021-05-22 | DRG: 286 | Disposition: A | Discharge status: Rehabilitation Facility | Payer: Medicaid Other | Attending: Cardiology | Admitting: Cardiology

## 2021-04-29 ENCOUNTER — Other Ambulatory Visit: Payer: Self-pay

## 2021-04-29 ENCOUNTER — Emergency Department (HOSPITAL_COMMUNITY): Payer: Medicaid Other

## 2021-04-29 DIAGNOSIS — E876 Hypokalemia: Secondary | ICD-10-CM | POA: Diagnosis not present

## 2021-04-29 DIAGNOSIS — E870 Hyperosmolality and hypernatremia: Secondary | ICD-10-CM | POA: Diagnosis not present

## 2021-04-29 DIAGNOSIS — Z91030 Bee allergy status: Secondary | ICD-10-CM

## 2021-04-29 DIAGNOSIS — Z888 Allergy status to other drugs, medicaments and biological substances status: Secondary | ICD-10-CM

## 2021-04-29 DIAGNOSIS — Z20822 Contact with and (suspected) exposure to covid-19: Secondary | ICD-10-CM | POA: Diagnosis present

## 2021-04-29 DIAGNOSIS — Z95810 Presence of automatic (implantable) cardiac defibrillator: Secondary | ICD-10-CM

## 2021-04-29 DIAGNOSIS — I428 Other cardiomyopathies: Secondary | ICD-10-CM | POA: Diagnosis not present

## 2021-04-29 DIAGNOSIS — N1831 Chronic kidney disease, stage 3a: Secondary | ICD-10-CM | POA: Diagnosis present

## 2021-04-29 DIAGNOSIS — Z8249 Family history of ischemic heart disease and other diseases of the circulatory system: Secondary | ICD-10-CM

## 2021-04-29 DIAGNOSIS — J9601 Acute respiratory failure with hypoxia: Secondary | ICD-10-CM

## 2021-04-29 DIAGNOSIS — I517 Cardiomegaly: Secondary | ICD-10-CM | POA: Diagnosis not present

## 2021-04-29 DIAGNOSIS — F32A Depression, unspecified: Secondary | ICD-10-CM | POA: Diagnosis present

## 2021-04-29 DIAGNOSIS — R570 Cardiogenic shock: Secondary | ICD-10-CM

## 2021-04-29 DIAGNOSIS — R079 Chest pain, unspecified: Secondary | ICD-10-CM | POA: Diagnosis not present

## 2021-04-29 DIAGNOSIS — J45909 Unspecified asthma, uncomplicated: Secondary | ICD-10-CM | POA: Diagnosis present

## 2021-04-29 DIAGNOSIS — R0902 Hypoxemia: Secondary | ICD-10-CM

## 2021-04-29 DIAGNOSIS — I472 Ventricular tachycardia: Secondary | ICD-10-CM | POA: Diagnosis present

## 2021-04-29 DIAGNOSIS — Z833 Family history of diabetes mellitus: Secondary | ICD-10-CM

## 2021-04-29 DIAGNOSIS — I5043 Acute on chronic combined systolic (congestive) and diastolic (congestive) heart failure: Secondary | ICD-10-CM | POA: Diagnosis not present

## 2021-04-29 DIAGNOSIS — R197 Diarrhea, unspecified: Secondary | ICD-10-CM | POA: Diagnosis not present

## 2021-04-29 DIAGNOSIS — Z79899 Other long term (current) drug therapy: Secondary | ICD-10-CM

## 2021-04-29 DIAGNOSIS — I509 Heart failure, unspecified: Secondary | ICD-10-CM

## 2021-04-29 DIAGNOSIS — Z91018 Allergy to other foods: Secondary | ICD-10-CM

## 2021-04-29 DIAGNOSIS — F17210 Nicotine dependence, cigarettes, uncomplicated: Secondary | ICD-10-CM | POA: Diagnosis present

## 2021-04-29 DIAGNOSIS — R778 Other specified abnormalities of plasma proteins: Secondary | ICD-10-CM | POA: Diagnosis present

## 2021-04-29 DIAGNOSIS — Z7984 Long term (current) use of oral hypoglycemic drugs: Secondary | ICD-10-CM

## 2021-04-29 DIAGNOSIS — J811 Chronic pulmonary edema: Secondary | ICD-10-CM | POA: Diagnosis not present

## 2021-04-29 DIAGNOSIS — N320 Bladder-neck obstruction: Secondary | ICD-10-CM | POA: Diagnosis present

## 2021-04-29 DIAGNOSIS — I4729 Other ventricular tachycardia: Secondary | ICD-10-CM

## 2021-04-29 DIAGNOSIS — E1165 Type 2 diabetes mellitus with hyperglycemia: Secondary | ICD-10-CM | POA: Diagnosis present

## 2021-04-29 DIAGNOSIS — R5381 Other malaise: Secondary | ICD-10-CM | POA: Diagnosis present

## 2021-04-29 DIAGNOSIS — J9811 Atelectasis: Secondary | ICD-10-CM | POA: Diagnosis not present

## 2021-04-29 DIAGNOSIS — Z886 Allergy status to analgesic agent status: Secondary | ICD-10-CM

## 2021-04-29 DIAGNOSIS — N139 Obstructive and reflux uropathy, unspecified: Secondary | ICD-10-CM | POA: Diagnosis not present

## 2021-04-29 DIAGNOSIS — Z794 Long term (current) use of insulin: Secondary | ICD-10-CM

## 2021-04-29 DIAGNOSIS — I5021 Acute systolic (congestive) heart failure: Secondary | ICD-10-CM

## 2021-04-29 DIAGNOSIS — Z452 Encounter for adjustment and management of vascular access device: Secondary | ICD-10-CM

## 2021-04-29 DIAGNOSIS — G4700 Insomnia, unspecified: Secondary | ICD-10-CM | POA: Diagnosis present

## 2021-04-29 DIAGNOSIS — I130 Hypertensive heart and chronic kidney disease with heart failure and stage 1 through stage 4 chronic kidney disease, or unspecified chronic kidney disease: Principal | ICD-10-CM | POA: Diagnosis present

## 2021-04-29 DIAGNOSIS — F419 Anxiety disorder, unspecified: Secondary | ICD-10-CM | POA: Diagnosis present

## 2021-04-29 DIAGNOSIS — Z4659 Encounter for fitting and adjustment of other gastrointestinal appliance and device: Secondary | ICD-10-CM

## 2021-04-29 DIAGNOSIS — K5900 Constipation, unspecified: Secondary | ICD-10-CM | POA: Diagnosis not present

## 2021-04-29 DIAGNOSIS — I493 Ventricular premature depolarization: Secondary | ICD-10-CM | POA: Diagnosis not present

## 2021-04-29 DIAGNOSIS — E878 Other disorders of electrolyte and fluid balance, not elsewhere classified: Secondary | ICD-10-CM | POA: Diagnosis present

## 2021-04-29 DIAGNOSIS — Z7901 Long term (current) use of anticoagulants: Secondary | ICD-10-CM

## 2021-04-29 DIAGNOSIS — G4733 Obstructive sleep apnea (adult) (pediatric): Secondary | ICD-10-CM | POA: Diagnosis present

## 2021-04-29 DIAGNOSIS — I4891 Unspecified atrial fibrillation: Secondary | ICD-10-CM

## 2021-04-29 DIAGNOSIS — J9602 Acute respiratory failure with hypercapnia: Secondary | ICD-10-CM

## 2021-04-29 DIAGNOSIS — I5023 Acute on chronic systolic (congestive) heart failure: Secondary | ICD-10-CM | POA: Diagnosis present

## 2021-04-29 DIAGNOSIS — Z85528 Personal history of other malignant neoplasm of kidney: Secondary | ICD-10-CM

## 2021-04-29 DIAGNOSIS — J810 Acute pulmonary edema: Secondary | ICD-10-CM | POA: Diagnosis not present

## 2021-04-29 DIAGNOSIS — R34 Anuria and oliguria: Secondary | ICD-10-CM | POA: Diagnosis not present

## 2021-04-29 DIAGNOSIS — N183 Chronic kidney disease, stage 3 unspecified: Secondary | ICD-10-CM

## 2021-04-29 DIAGNOSIS — Z6841 Body Mass Index (BMI) 40.0 and over, adult: Secondary | ICD-10-CM

## 2021-04-29 DIAGNOSIS — Z01818 Encounter for other preprocedural examination: Secondary | ICD-10-CM

## 2021-04-29 DIAGNOSIS — Z905 Acquired absence of kidney: Secondary | ICD-10-CM

## 2021-04-29 DIAGNOSIS — Z9289 Personal history of other medical treatment: Secondary | ICD-10-CM

## 2021-04-29 DIAGNOSIS — J15211 Pneumonia due to Methicillin susceptible Staphylococcus aureus: Secondary | ICD-10-CM | POA: Diagnosis not present

## 2021-04-29 DIAGNOSIS — R0789 Other chest pain: Secondary | ICD-10-CM | POA: Diagnosis not present

## 2021-04-29 DIAGNOSIS — M109 Gout, unspecified: Secondary | ICD-10-CM | POA: Diagnosis present

## 2021-04-29 DIAGNOSIS — N179 Acute kidney failure, unspecified: Secondary | ICD-10-CM | POA: Diagnosis present

## 2021-04-29 DIAGNOSIS — T380X5A Adverse effect of glucocorticoids and synthetic analogues, initial encounter: Secondary | ICD-10-CM | POA: Diagnosis not present

## 2021-04-29 DIAGNOSIS — E1122 Type 2 diabetes mellitus with diabetic chronic kidney disease: Secondary | ICD-10-CM | POA: Diagnosis present

## 2021-04-29 DIAGNOSIS — I4819 Other persistent atrial fibrillation: Secondary | ICD-10-CM | POA: Diagnosis present

## 2021-04-29 DIAGNOSIS — E7800 Pure hypercholesterolemia, unspecified: Secondary | ICD-10-CM | POA: Diagnosis present

## 2021-04-29 LAB — BASIC METABOLIC PANEL
Anion gap: 11 (ref 5–15)
BUN: 43 mg/dL — ABNORMAL HIGH (ref 6–20)
CO2: 20 mmol/L — ABNORMAL LOW (ref 22–32)
Calcium: 9.3 mg/dL (ref 8.9–10.3)
Chloride: 106 mmol/L (ref 98–111)
Creatinine, Ser: 1.99 mg/dL — ABNORMAL HIGH (ref 0.61–1.24)
GFR, Estimated: 41 mL/min — ABNORMAL LOW (ref >60)
Glucose, Bld: 253 mg/dL — ABNORMAL HIGH (ref 70–99)
Potassium: 3.6 mmol/L (ref 3.5–5.1)
Sodium: 137 mmol/L (ref 135–145)

## 2021-04-29 LAB — CBC
HCT: 45.3 % (ref 39.0–52.0)
Hemoglobin: 15 g/dL (ref 13.0–17.0)
MCH: 27.6 pg (ref 26.0–34.0)
MCHC: 33.1 g/dL (ref 30.0–36.0)
MCV: 83.4 fL (ref 80.0–100.0)
Platelets: 221 10*3/uL (ref 150–400)
RBC: 5.43 MIL/uL (ref 4.22–5.81)
RDW: 18.1 % — ABNORMAL HIGH (ref 11.5–15.5)
WBC: 9.6 10*3/uL (ref 4.0–10.5)
nRBC: 0 % (ref 0.0–0.2)

## 2021-04-29 LAB — TROPONIN I (HIGH SENSITIVITY)
Troponin I (High Sensitivity): 74 ng/L — ABNORMAL HIGH (ref <18)
Troponin I (High Sensitivity): 64 ng/L — ABNORMAL HIGH (ref <18)

## 2021-04-29 LAB — RESP PANEL BY RT-PCR (FLU A&B, COVID) ARPGX2
Influenza A by PCR: NEGATIVE
Influenza B by PCR: NEGATIVE
SARS Coronavirus 2 by RT PCR: NEGATIVE

## 2021-04-29 LAB — BRAIN NATRIURETIC PEPTIDE: B Natriuretic Peptide: 1407.7 pg/mL — ABNORMAL HIGH (ref 0.0–100.0)

## 2021-04-29 MED ORDER — AMIODARONE HCL IN DEXTROSE 360-4.14 MG/200ML-% IV SOLN
30.0000 mg/h | INTRAVENOUS | Status: DC
  Administered 2021-04-29 – 2021-04-30 (×3): 30 mg/h via INTRAVENOUS
  Administered 2021-04-30: 60 mg/h via INTRAVENOUS
  Administered 2021-05-01: 30 mg/h via INTRAVENOUS
  Administered 2021-05-01 – 2021-05-02 (×5): 60 mg/h via INTRAVENOUS
  Administered 2021-05-03: 30 mg/h via INTRAVENOUS
  Administered 2021-05-03 (×2): 60 mg/h via INTRAVENOUS
  Administered 2021-05-04 – 2021-05-06 (×5): 30 mg/h via INTRAVENOUS
  Filled 2021-04-29 (×19): qty 200

## 2021-04-29 MED ORDER — AMIODARONE IV BOLUS ONLY 150 MG/100ML
150.0000 mg | Freq: Once | INTRAVENOUS | Status: AC
  Administered 2021-04-29: 150 mg via INTRAVENOUS
  Filled 2021-04-29: qty 100

## 2021-04-29 MED ORDER — SODIUM CHLORIDE 0.9 % IV BOLUS
500.0000 mL | Freq: Once | INTRAVENOUS | Status: AC
  Administered 2021-04-29: 500 mL via INTRAVENOUS

## 2021-04-29 MED ORDER — AMIODARONE HCL IN DEXTROSE 360-4.14 MG/200ML-% IV SOLN
60.0000 mg/h | INTRAVENOUS | Status: AC
  Administered 2021-04-29: 60 mg/h via INTRAVENOUS
  Filled 2021-04-29 (×2): qty 200

## 2021-04-29 MED ORDER — AMIODARONE HCL 200 MG PO TABS
100.0000 mg | ORAL_TABLET | Freq: Every day | ORAL | Status: DC
  Administered 2021-04-29: 100 mg via ORAL
  Filled 2021-04-29: qty 1

## 2021-04-29 MED ORDER — POTASSIUM CHLORIDE CRYS ER 20 MEQ PO TBCR
40.0000 meq | EXTENDED_RELEASE_TABLET | Freq: Once | ORAL | Status: AC
  Administered 2021-04-29: 40 meq via ORAL
  Filled 2021-04-29: qty 2

## 2021-04-29 MED ORDER — AMIODARONE LOAD VIA INFUSION: 50.0000 mg | Freq: Once | INTRAVENOUS | Status: DC

## 2021-04-29 MED ORDER — AMIODARONE LOAD VIA INFUSION
150.0000 mg | Freq: Once | INTRAVENOUS | Status: AC
  Administered 2021-04-29: 150 mg via INTRAVENOUS
  Filled 2021-04-29: qty 83.34

## 2021-04-29 MED ORDER — AMIODARONE LOAD VIA INFUSION
50.0000 mg | Freq: Once | INTRAVENOUS | Status: AC
  Administered 2021-04-29: 50 mg via INTRAVENOUS

## 2021-04-29 MED ORDER — DEXTROSE 5 % IV SOLN: 50.0000 mg | Freq: Once | INTRAVENOUS | Status: DC

## 2021-04-29 MED ORDER — FUROSEMIDE 10 MG/ML IJ SOLN
40.0000 mg | Freq: Once | INTRAMUSCULAR | Status: AC
  Administered 2021-04-29: 40 mg via INTRAVENOUS
  Filled 2021-04-29: qty 4

## 2021-04-29 NOTE — H&P (Signed)
Cardiology Admission History and Physical:   Patient ID: ARIUS HARNOIS MRN: [id number]; DOB: 1976/01/16   Admission date: 04/29/2021  PCP:  [name], FNP (Inactive)   [facility] Providers Cardiologist:  Glori Bickers, MD  Electrophysiologist:  Virl Axe, MD       Chief Complaint: Atrial fibrillation, RVR and VT  Patient Profile:   CHRISTROPHER GINTZ is a 45 y.o. male with  a history of  poorly controlled HTN, R renal cell carcinoma s/p nephrectomy, CKD IIIa, DM2, OSA, gout, morbid obesity and systolic HF due to NICM, s/p BSci EMBLEM MRI S-ICD. He is being seen 04/29/2021 for the evaluation of rapid atrial fibrillation and ventricular tachycardia.  History of Present Illness:   Mr. Perleberg was in his usual state of health until Saturday.  His weight has been stable.  He is compliant with his medications and CPAP.  No acute illnesses.  Then he started feeling bad.  He could feel some palpitations.  He was getting some dyspnea on exertion.  His blood pressure cuff was not working.  He could tell that his heart rate was going fast.  He did not have presyncope or syncope.  Today, he decided that things were not getting any better and he should come to the hospital.  In the ER, he is in atrial fibrillation RVR with heart rates up into the 160s at times.  He is having frequent runs of ventricular tachycardia, from 3-15 beats.  There are no runs lasting 15-30 seconds.  At his last device interrogation 02/19/2021, he was having no atrial fibrillation and no long runs of VT, he only gets a couple of beats at a time.  At this time, he is not having chest pain, but he is short of breath.  The only time he is not short of breath is when he is wearing the CPAP. He has not been having LE edema, no orthopnea or PND.   He he has not had chest pain.  His device has not shocked him.  He is compliant with his medications, but was feeling bad this morning before he came to the hospital and did not  take his usual a.m. meds.   Past Medical History:  Diagnosis Date   Anxiety    Aspirin allergy    Childhood asthma    Chronic systolic CHF (congestive heart failure) ([place])    a. EF 20-25% in 2012. b. EF 45-50% in 10/2011 with nonischemic nuc - presumed NICM. c. 12/2014 Echo: Sev depressed LV fxn, sev dil LV, mild LVH, mild MR, sev dil LA, mildly reduced RV fxn.   CKD (chronic kidney disease) stage 2, GFR 60-89 ml/min    H/O vasectomy 12/2019   High cholesterol    Hypertension    Morbid obesity ([place])    Nephrolithiasis    OSA on CPAP    Paroxysmal atrial fibrillation ([place])    Presumed NICM    a. 04/2014 Myoview: EF 26%, glob HK, sev glob HK, ? prior infarct;  b. Never cathed 2/2 CKD.   Renal cell carcinoma ([place])    a. s/p Rt robotic assisted partial converted to radical nephrectomy on 01/2013.   Troponin level elevated    a. 04/2014, 12/2014: felt due to CHF.   Type II diabetes mellitus ([name])    Ventricular tachycardia ([place])    a. appropriate ICD therapy 12/2017    Past Surgical History:  Procedure Laterality Date   APPENDECTOMY  07/2004   BIOPSY  02/10/2021  Procedure: BIOPSY;  Surgeon: Gatha Mayer, MD;  Location: [facility];  Service: Endoscopy;;   CARDIAC CATHETERIZATION N/A 05/17/2015   Procedure: Right/Left Heart Cath and Coronary Angiography;  Surgeon: Jolaine Artist, MD;  Location: [place] [facility];  Service: Cardiovascular;  Laterality: N/A;   COLONOSCOPY WITH PROPOFOL N/A 02/10/2021   Procedure: COLONOSCOPY WITH PROPOFOL;  Surgeon: Gatha Mayer, MD;  Location: [facility];  Service: Endoscopy;  Laterality: N/A;   EP IMPLANTABLE DEVICE N/A 05/17/2015   Procedure: SubQ ICD Implant;  Surgeon: Will Meredith Leeds, MD;  Location: [place] [facility];  Service: Cardiovascular;  Laterality: N/A;   ESOPHAGOGASTRODUODENOSCOPY (EGD) WITH PROPOFOL N/A 02/10/2021   Procedure: ESOPHAGOGASTRODUODENOSCOPY (EGD) WITH PROPOFOL;  Surgeon: Gatha Mayer, MD;  Location: [facility];   Service: Endoscopy;  Laterality: N/A;   ROBOTIC ASSISTED LAPAROSCOPIC LYSIS OF ADHESION  01/13/2013   Procedure: ROBOTIC ASSISTED LAPAROSCOPIC LYSIS OF ADHESION EXTENSIVE;  Surgeon: Alexis Frock, MD;  Location: [facility];  Service: Urology;;   ROBOTIC ASSITED PARTIAL NEPHRECTOMY Right 01/13/2013   Procedure: ROBOTIC ASSITED PARTIAL NEPHRECTOMY CONVERTED TO ROBOTIC ASSISTED RIGHT RADICAL NEPHRECTOMY;  Surgeon: Alexis Frock, MD;  Location: [facility];  Service: Urology;  Laterality: Right;   SUBQ ICD CHANGEOUT N/A 05/16/2020   Procedure: SUBQ ICD CHANGEOUT;  Surgeon: [name], MD;  Location: [place] [facility];  Service: Cardiovascular;  Laterality: N/A;   SUBQ ICD CHANGEOUT N/A 05/15/2020   Procedure: SUBQ ICD CHANGEOUT;  Surgeon: [name], MD;  Location: [place] [facility];  Service: Cardiovascular;  Laterality: N/A;   VASECTOMY       Medications Prior to Admission: Prior to Admission medications   Medication Sig Start Date End Date Taking? Authorizing Provider  allopurinol (ZYLOPRIM) 100 MG tablet Take 1 tablet (100 mg total) by mouth daily. 01/09/21  Yes Clegg, Amy D, NP  amiodarone (PACERONE) 100 MG tablet TAKE 1 TABLET (100 MG TOTAL) BY MOUTH DAILY. Patient taking differently: Take 100 mg by mouth daily. 01/09/21 01/09/22 Yes Clegg, Amy D, NP  apixaban (ELIQUIS) 5 MG TABS tablet TAKE 1 TABLET (5 MG TOTAL) BY MOUTH 2 (TWO) TIMES DAILY. RESUME WITH EVENING DOSE ON SUNDAY Patient taking differently: Take 5 mg by mouth 2 (two) times daily. 01/09/21 01/09/22 Yes Clegg, Amy D, NP  busPIRone (BUSPAR) 10 MG tablet TAKE 1 TABLET (10 MG TOTAL) BY MOUTH 2 (TWO) TIMES DAILY. Patient taking differently: Take 10 mg by mouth 2 (two) times daily. 06/24/20 06/24/21 Yes [name], FNP  carvedilol (COREG) 6.25 MG tablet TAKE 1 TABLET (6.25 MG TOTAL) BY MOUTH 2 (TWO) TIMES DAILY. Patient taking differently: Take 6.25 mg by mouth 2 (two) times daily. 08/07/20 08/07/21 Yes Bensimhon, Shaune Pascal, MD   cetirizine (ZYRTEC) 10 MG tablet Take 1 tablet (10 mg total) by mouth daily. 06/27/20  Yes [name], FNP  empagliflozin (JARDIANCE) 10 MG TABS tablet TAKE 1 TABLET (10 MG TOTAL) BY MOUTH DAILY BEFORE BREAKFAST. Patient taking differently: Take 10 mg by mouth daily before breakfast. 12/06/20 12/06/21 Yes Milford, Maricela Bo, FNP  famotidine (PEPCID) 40 MG tablet TAKE 1 TABLET (40 MG TOTAL) BY MOUTH DAILY AS NEEDED FOR HEARTBURN OR INDIGESTION. Patient taking differently: Take 40 mg by mouth daily as needed for indigestion or heartburn. 12/25/20 12/25/21 Yes Gatha Mayer, MD  ferrous sulfate 325 (65 FE) MG tablet Take 1 tablet (325 mg total) by mouth daily with breakfast. 03/04/21  Yes Gatha Mayer, MD  fluticasone (  FLONASE) 50 MCG/ACT nasal spray Place 2 sprays into both nostrils daily. Patient taking differently: Place 2 sprays into both nostrils daily as needed for allergies. 09/18/19  Yes [name], FNP  gabapentin (NEURONTIN) 300 MG capsule Take 1 capsule (300 mg total) by mouth 3 (three) times daily. 06/27/20  Yes [name], FNP  glipiZIDE (GLUCOTROL) 10 MG tablet Take 1 tablet (10 mg total) by mouth 2 (two) times daily before a meal. 04/22/21  Yes King, Diona Foley, NP  insulin aspart (NOVOLOG) 100 UNIT/ML FlexPen INJECT 0-15 UNIT(S) INTO THE SKIN THREE TIMES DAILY WITH MEALS VIA SLIDING SCALE Patient taking differently: Inject 0-15 Units into the skin 3 (three) times daily with meals. 11/15/20 11/15/21 Yes [name], FNP  insulin NPH-regular Human (70-30) 100 UNIT/ML injection INJECT 38 UNITS INTO THE SKIN 2 (TWO) TIMES DAILY WITH A MEAL. Patient taking differently: Inject 38 Units into the skin 2 (two) times daily with a meal. 06/27/20 06/27/21 Yes [name], FNP  metFORMIN (GLUCOPHAGE) 1000 MG tablet Take 1 tablet (1,000 mg total) by mouth 2 (two) times daily with a meal. 01/09/21  Yes Clegg, Amy D, NP  oxyCODONE (ROXICODONE) 5 MG immediate release tablet Take 1  tablet (5 mg total) by mouth every 6 (six) hours as needed for up to 10 doses for breakthrough pain. 04/12/21  Yes Curatolo, Adam, DO  sacubitril-valsartan (ENTRESTO) 49-51 MG TAKE 1 TABLET BY MOUTH 2 (TWO) TIMES DAILY. 01/24/21  Yes Clegg, Amy D, NP  spironolactone (ALDACTONE) 25 MG tablet Take 1 tablet (25 mg total) by mouth daily. 01/09/21 08/07/21 Yes Clegg, Amy D, NP  torsemide (DEMADEX) 20 MG tablet TAKE 1 TABLET (20 MG TOTAL) BY MOUTH DAILY. Patient taking differently: Take 20 mg by mouth daily. 01/09/21 01/09/22 Yes Clegg, Amy D, NP  traZODone (DESYREL) 100 MG tablet Take 1 tablet (100 mg total) by mouth at bedtime. For sleep 03/05/21  Yes Vevelyn Francois, NP  Vitamin D, Ergocalciferol, (DRISDOL) 1.25 MG (50000 UNIT) CAPS capsule TAKE 1 CAPSULE (50,000 UNITS TOTAL) BY MOUTH ONCE A WEEK. Patient taking differently: Take 50,000 Units by mouth every Monday. 10/29/20 10/29/21 Yes [name], FNP  Accu-Chek FastClix Lancets MISC USE AS DIRECTED FOUR TIMES DAILY 01/29/21 01/29/22  Vevelyn Francois, NP  blood glucose meter kit and supplies Use up to four times daily as directed. 01/27/21   Vevelyn Francois, NP  Continuous Blood Gluc Receiver (FREESTYLE LIBRE 14 DAY READER) DEVI 1 each by Does not apply route 3 (three) times daily between meals as needed. 03/19/20   [name], FNP  Continuous Blood Gluc Sensor (GUARDIAN SENSOR 3) MISC 1 Device by Does not apply route 3 (three) times daily between meals as needed. 03/25/20   [name], FNP  Insulin Pen Needle 31G X 5 MM MISC USE AS DIRECTED 11/27/20 11/27/21  [name], FNP  Insulin Syringe-Needle U-100 31G X 5/16" 0.5 ML MISC USE TO INJECT INSULIN TWICE DAILY. 09/17/20 09/17/21  [name], FNP  Magnesium Oxide 200 MG TABS Take 100 mg by mouth daily.    [provider]  predniSONE (DELTASONE) 10 MG tablet take 2 tablets by mouth for 2 days, then 1 tab for 2 days, then stop Patient not taking: Reported on 04/29/2021 04/16/21    Fenton Foy, NP  Colchicine (MITIGARE) 0.6 MG CAPS Take 1 tablet by mouth 2 (two) times daily as needed (gout). 06/27/20 11/26/20  [name],  FNP     Allergies:    Allergies  Allergen Reactions   Aspirin Shortness Of Breath, Itching and Rash     Burning sensation (Patient reports he tolerates other NSAIDS)    Bee Venom Hives and Swelling   Lisinopril Cough   Tomato Rash    Social History:   Social History   Socioeconomic History   Marital status: Married    Spouse name: Not on file   Number of children: Not on file   Years of education: Not on file   Highest education level: Not on file  Occupational History   Not on file  Tobacco Use   Smoking status: Every Day    Packs/day: 0.25    Years: 22.00    Pack years: 5.50    Types: Cigarettes    Last attempt to quit: 04/16/2015    Years since quitting: 6.0   Smokeless tobacco: Never  Vaping Use   Vaping Use: Never used  Substance and Sexual Activity   Alcohol use: Yes    Comment: 7 drinks per week max   Drug use: Yes    Frequency: 14.0 times per week    Types: Marijuana    Comment: uses marijuana twice a day   Sexual activity: Yes    Birth control/protection: Condom  Other Topics Concern   Not on file  Social History Narrative   Married - 25 kids 61 boys 7 girls many are adults, 2 young children with current wife   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 There is no history of Heart attack, Stroke, Colon cancer, Pancreatic cancer, Stomach cancer, Liver cancer, or Esophageal cancer.    ROS:  Please see the history of present illness.  All other ROS reviewed and negative.     Physical Exam/Data:   Vitals:   04/29/21 1645 04/29/21 1730 04/29/21 1815 04/29/21 1830  BP: 108/89 (!) 111/91 103/88 (!) 106/92  Pulse: (!) 28 82 (!) 55 (!) 35  Resp: 11 (!) 26 18 (!) 24  Temp:      TempSrc:      SpO2: 98% 97% 97% 98%   No intake or output data in the 24 hours ending 04/29/21 1902 Last 3 Weights 04/12/2021 02/10/2021 02/05/2021  Weight (lbs) 340 lb 324 lb 15.3 oz 325 lb  Weight (kg) 154.223 kg 147.4 kg 147.419 kg     There is no height or weight on file to calculate BMI.  General:  Well nourished, well developed, in moderate distress HEENT: normal Lymph: no adenopathy Neck: JVD 8-9 cm Endocrine:  No thryomegaly Vascular: No carotid bruits; 4/4 extremity pulses 2+ bilaterally Cardiac:  normal S1, S2; rapid and irregular; no murmur  Lungs:  few rales bases bilaterally, no wheezing, rhonchi   Abd: soft, nontender, no hepatomegaly  Ext: no edema Musculoskeletal:  No deformities, BUE and BLE strength normal and equal Skin: warm and dry  Neuro:  CNs 2-12 intact, no focal abnormalities noted Psych:  Normal affect    EKG:  The ECG that was done today was personally reviewed and demonstrates atrial fibrillation RVR  Telemetry: Atrial fibrillation RVR with frequent runs of NSVT, less than 10 seconds in duration  Relevant CV Studies:  ECHO: 06/06/2020  1. Left ventricular ejection fraction, by estimation, is 20 to 25%. The left ventricle has severely decreased function. The left ventricle demonstrates global hypokinesis. The left ventricular internal cavity size was mildly dilated. There is mild left  ventricular hypertrophy. Left ventricular diastolic parameters are  consistent with Grade I diastolic dysfunction (impaired relaxation).   2.  Right ventricular systolic function is normal. The right ventricular size is normal.   3. Left atrial size was mildly dilated.   4. The mitral valve is normal in structure. Trivial mitral valve  regurgitation. No evidence of mitral stenosis.   5. The aortic valve is normal in structure. Aortic valve regurgitation is not visualized. No aortic stenosis is present.   6. The inferior vena cava is normal in size with greater than 50%  respiratory variability, suggesting right atrial pressure of 3 mmHg.   Comparison(s): Prior images reviewed side by side. Prior EF 30%.   Laboratory Data:  High Sensitivity Troponin:   Recent Labs  Lab 04/29/21 1247 04/29/21 1659  TROPONINIHS 74* 64*      Chemistry Recent Labs  Lab 04/29/21 1247  NA 137  K 3.6  CL 106  CO2 20*  GLUCOSE 253*  BUN 43*  CREATININE 1.99*  CALCIUM 9.3  GFRNONAA 41*  ANIONGAP 11    No results for input(s): PROT, ALBUMIN, AST, ALT, ALKPHOS, BILITOT in the last 168 hours. Hematology Recent Labs  Lab 04/29/21 1247  WBC 9.6  RBC 5.43  HGB 15.0  HCT 45.3  MCV 83.4  MCH 27.6  MCHC 33.1  RDW 18.1*  PLT 221   BNP Recent Labs  Lab 04/29/21 1247  BNP 1,407.7*    DDimer No results for input(s): DDIMER in the last 168 hours.  Lab Results  Component Value Date   CHOL 188 10/28/2020   HDL 37 (L) 10/28/2020   LDLCALC 107 (H) 10/28/2020   TRIG 255 (H) 10/28/2020   CHOLHDL 5.1 (H) 10/28/2020   Lab Results  Component Value Date   TSH 4.120 10/28/2020   Lab Results  Component Value Date   HGBA1C 8.6 (H) 10/28/2020     Radiology/Studies:  DG Chest 2 View  Result Date: 04/29/2021 CLINICAL DATA:  Chest pain EXAM: CHEST - 2 VIEW COMPARISON:  11/21/2020 chest radiograph. FINDINGS: Stable configuration of single lead left chest ICD with lead tip overlying the upper left heart. Stable cardiomediastinal silhouette with mild cardiomegaly. No pneumothorax. No pleural effusion. Borderline mild pulmonary edema. No  consolidative airspace disease. IMPRESSION: Borderline mild congestive heart failure. Electronically Signed   By: Ilona Sorrel M.D.   On: 04/29/2021 13:07     Assessment and Plan:   Persistent atrial fibrillation, RVR -Duration is almost certainly more than 48 hours - Although he does not generally miss Eliquis, he did not take it this morning because he was feeling so bad, in fact he did not take any of his medications. - He is currently on IV amiodarone, but his rate is not much improved - We will rebolus the amiodarone - We will resume the Eliquis - If he cannot get a cardioversion in the emergency room, he will have to be admitted and  we will do a TEE cardioversion at the first available opening  2.  Chronic combined but mostly systolic CHF - He has been weighing at home and his weight has not changed significantly - He is compliant with his medications - He has mild CHF on chest x-ray and his BNP is elevated -This may be secondary to several days of RVR - Give a dose of IV Lasix tonight - he normally takes torsemide 20 mg daily, will give Lasix 40 mg and assess response -Otherwise continue home medications  Risk Assessment/Risk Scores:       New York Heart Association (NYHA) Functional Class NYHA Class III  CHA2DS2-VASc Score = 3  This indicates a 3.2% annual risk of stroke. The patient's score is based upon: CHF History: Yes HTN History: Yes Diabetes History: Yes Stroke History: No Vascular Disease History: No Age Score: 0 Gender Score: 0   Severity of Illness: The appropriate patient status for this patient is INPATIENT. Inpatient status is judged to be reasonable and necessary in order to provide the required intensity of service to ensure the patient's safety. The patient's presenting symptoms, physical exam findings, and initial radiographic and laboratory data in the context of their chronic comorbidities is felt to place them at high risk for further clinical  deterioration. Furthermore, it is not anticipated that the patient will be medically stable for discharge from the hospital within 2 midnights of admission. The following factors support the patient status of inpatient.   " The patient's presenting symptoms include shortness of breath. " The worrisome physical exam findings include rapid atrial fibrillation. " The initial radiographic and laboratory data are worrisome because of CHF. " The chronic co-morbidities include CHF.   * I certify that at the point of admission it is my clinical judgment that the patient will require inpatient hospital care spanning beyond 2 midnights from the point of admission due to high intensity of service, high risk for further deterioration and high frequency of surveillance required.*   For questions or updates, please contact [place] Please consult [email or web address] for contact info under     Signed, Rosaria Ferries, PA-C  04/29/2021 7:02 PM

## 2021-04-29 NOTE — ED Provider Notes (Signed)
Emergency Medicine Provider Triage Evaluation Note  [name] , a 45 y.o. male  was evaluated in triage.  Pt complains of dizziness and chest pain x 3 days. Has history of the same.  Has defibrillator, and has not fired.  Patient states he has history of heart failure, wonders if he is fluid overloaded.  Denies any chest pain at this moment.  Review of Systems  Positive: Chest pain, dizziness Negative: Shortness of breath, hemoptysis, leg swelling, weight gain  Physical Exam  BP (!) 138/93   Pulse (!) 53   Temp 98.6 F (37 C) (Oral)   Resp 18   SpO2 92%  Gen:   Awake, no distress   Resp:  Normal effort  MSK:   Moves extremities without difficulty  Other:  No lower extremity edema  Medical Decision Making  Medically screening exam initiated at 12:38 PM.  Appropriate orders placed.  [name] was informed that the remainder of the evaluation will be completed by another provider, this initial triage assessment does not replace that evaluation, and the importance of remaining in the ED until their evaluation is complete.  Cardiac work up initiated, BNP ordered as well. EKG shows afib with RVR. EKG shown to ED attending, charge RN Gilberto Better aware he needs a room.   Portions of this note were generated with Lobby[employer]. Dictation errors may occur despite best attempts at proofreading.    Barrie Folk, PA-C 04/29/21 1238    Lajean Saver, MD 05/01/21 1309

## 2021-04-29 NOTE — ED Triage Notes (Signed)
Pt reports sudden onset of central and L sided chest pain, shortness of breath, and dizziness  at noon yesterday. Symptoms constant since onset. Also reports intermittent palpitations since Sunday, which have resolved.

## 2021-04-29 NOTE — ED Notes (Signed)
RN is aware Pt is in [place]

## 2021-04-29 NOTE — ED Notes (Signed)
Pt was noted to be having runs of vtach, EDP made aware. Zole at bedside, 2 PIV started, EKG obtained. Pt A/Ox4, verbal-denies pain.

## 2021-04-29 NOTE — ED Notes (Signed)
Cardiology at bedside.

## 2021-04-29 NOTE — ED Notes (Signed)
Patient up to [facility] for BM. Small amount of bright red blood noted in stool. RN made aware.

## 2021-04-29 NOTE — ED Provider Notes (Signed)
  Physical Exam  BP (!) 113/94   Pulse (!) 102   Temp 98.6 F (37 C) (Oral)   Resp (!) 30   SpO2 97%   Physical Exam  ED Course/Procedures     Procedures  MDM  44 yo male presented with chest pain and dyspnea.   patient with runs of vtach and afib rvr.  Patient amiodarone- his oral dose, then drip at 150/10 minutes and drip.  BP decreased to 80s, drip stopped. Cardiology to see. Defibrillator will need to be queried. 4:17 PM Patient with complaints of mild chest discomfort at 8/10.  HR 160s SBP 120s Amiodarone drip to be restarted 7:06 PM Patient continues very tachycardic.  Additional dose of amiodarone 50 mg ordered. Discussed with Dr. Radford Pax, on-call for cardiology HR noted 35 in nursing note- patient with hr 140s Cardiology has seen and evaluated and assumed care        Pattricia Boss, MD 04/29/21 [phone]

## 2021-04-29 NOTE — ED Provider Notes (Addendum)
[facility] EMERGENCY DEPARTMENT Provider Note   CSN: [id number] Arrival date & time: 04/29/21  1154     History Chief Complaint  Patient presents with   Shortness of Breath   Chest Pain   Dizziness    Jeffrey [name] is a 45 y.o. male.  Patient with history of congestive heart failure, history of intermittent atrial fibrillation, intermittent ventricular tachycardia, AICD placement, presents with chief complaint of chest pain.  Describes as aching pressure-like mid chest sensation ongoing off and on for the past 3 to 4 days.  Denies vomiting cough or diarrhea denies fevers.  Associated with shortness of breath and palpitations and lightheadedness.      Past Medical History:  Diagnosis Date   Anxiety    Aspirin allergy    Childhood asthma    Chronic systolic CHF (congestive heart failure) ([place])    a. EF 20-25% in 2012. b. EF 45-50% in 10/2011 with nonischemic nuc - presumed NICM. c. 12/2014 Echo: Sev depressed LV fxn, sev dil LV, mild LVH, mild MR, sev dil LA, mildly reduced RV fxn.   CKD (chronic kidney disease) stage 2, GFR 60-89 ml/min    H/O vasectomy 12/2019   High cholesterol    Hypertension    Morbid obesity ([place])    Nephrolithiasis    OSA on CPAP    Paroxysmal atrial fibrillation ([place])    Presumed NICM    a. 04/2014 Myoview: EF 26%, glob HK, sev glob HK, ? prior infarct;  b. Never cathed 2/2 CKD.   Renal cell carcinoma ([place])    a. s/p Rt robotic assisted partial converted to radical nephrectomy on 01/2013.   Troponin level elevated    a. 04/2014, 12/2014: felt due to CHF.   Type II diabetes mellitus ([place])    Ventricular tachycardia ([place])    a. appropriate ICD therapy 12/2017    Patient Active Problem List   Diagnosis Date Noted   Right wrist pain 04/16/2021   Absolute anemia    Rectal bleeding    Iron deficiency 12/25/2020   Long term current use of anticoagulant - apixaban 12/25/2020   Paroxysmal atrial fibrillation (HCC)    Lactic acidosis  11/22/2020   Hypotension 11/22/2020   Aspiration into airway    ICD (implantable cardioverter-defibrillator) battery depletion 05/15/2020   Endotracheal tube present    Respiratory failure ([place])    Acute encephalopathy    Acute pulmonary edema (HCC)    NICM (nonischemic cardiomyopathy) ([place]) 04/24/2020   S/P vasectomy 12/20/2019   Anxiety 12/20/2019   Hemoglobin A1C between 7% and 9% indicating borderline diabetic control 12/20/2019   Seasonal allergies 12/20/2019   Insomnia 12/20/2019   Vasectomy evaluation 06/20/2019   Visual problems 06/20/2019   Blurry vision, bilateral 06/20/2019   Hyperglycemia 02/13/2019   Hyperosmolar (nonketotic) coma ([place]) 01/19/2019   AICD (automatic cardioverter/defibrillator) present 01/19/2019   Hyperlipidemia 12/07/2018   Follow-up exam 11/22/2018   Class 3 severe obesity due to excess calories with serious comorbidity and body mass index (BMI) of 45.0 to 49.9 in adult ([place]) 11/22/2018   Dizziness 11/22/2018   Lingular pneumonia 08/04/2018   Ventricular tachycardia ([place]) 12/30/2017   PAF (paroxysmal atrial fibrillation) ([place])    Dilated cardiomyopathy ([place])    Pollen allergy 02/17/2017   Dyslipidemia 02/17/2017   Hematochezia 11/08/2015   CKD (chronic kidney disease), stage III ([place]) 06/20/2015   Cardiac defibrillator in situ    Chronic systolic CHF (congestive heart failure) ([place]) 03/11/2015   Gouty arthritis  03/11/2015   Chest pain 12/28/2014   AKI (acute kidney injury) ([place]) 04/08/2014   Aspirin allergy 04/08/2014   Renal cell carcinoma ([place])    Gout attack 11/10/2011   Morbid obesity ([place]) 10/23/2011   OSA (obstructive sleep apnea) 10/23/2011   Type 2 diabetes mellitus with stage 3 chronic kidney disease, with long-term current use of insulin ([place]) 10/22/2011   Hyperlipidemia associated with type 2 diabetes mellitus ([place]) 12/25/2010   Hypertension associated with diabetes ([place]) 12/25/2010    Past Surgical History:  Procedure  Laterality Date   APPENDECTOMY  07/2004   BIOPSY  02/10/2021   Procedure: BIOPSY;  Surgeon: Gatha Mayer, MD;  Location: [facility];  Service: Endoscopy;;   CARDIAC CATHETERIZATION N/A 05/17/2015   Procedure: Right/Left Heart Cath and Coronary Angiography;  Surgeon: Jolaine Artist, MD;  Location: [facility] LAB;  Service: Cardiovascular;  Laterality: N/A;   COLONOSCOPY WITH PROPOFOL N/A 02/10/2021   Procedure: COLONOSCOPY WITH PROPOFOL;  Surgeon: Gatha Mayer, MD;  Location: [facility];  Service: Endoscopy;  Laterality: N/A;   EP IMPLANTABLE DEVICE N/A 05/17/2015   Procedure: SubQ ICD Implant;  Surgeon: Will Meredith Leeds, MD;  Location: [facility] CV LAB;  Service: Cardiovascular;  Laterality: N/A;   ESOPHAGOGASTRODUODENOSCOPY (EGD) WITH PROPOFOL N/A 02/10/2021   Procedure: ESOPHAGOGASTRODUODENOSCOPY (EGD) WITH PROPOFOL;  Surgeon: Gatha Mayer, MD;  Location: [facility];  Service: Endoscopy;  Laterality: N/A;   ROBOTIC ASSISTED LAPAROSCOPIC LYSIS OF ADHESION  01/13/2013   Procedure: ROBOTIC ASSISTED LAPAROSCOPIC LYSIS OF ADHESION EXTENSIVE;  Surgeon: Alexis Frock, MD;  Location: [facility];  Service: Urology;;   ROBOTIC ASSITED PARTIAL NEPHRECTOMY Right 01/13/2013   Procedure: ROBOTIC ASSITED PARTIAL NEPHRECTOMY CONVERTED TO ROBOTIC ASSISTED RIGHT RADICAL NEPHRECTOMY;  Surgeon: Alexis Frock, MD;  Location: [facility];  Service: Urology;  Laterality: Right;   SUBQ ICD CHANGEOUT N/A 05/16/2020   Procedure: SUBQ ICD CHANGEOUT;  Surgeon: [name], MD;  Location: [facility] CV LAB;  Service: Cardiovascular;  Laterality: N/A;   SUBQ ICD CHANGEOUT N/A 05/15/2020   Procedure: SUBQ ICD CHANGEOUT;  Surgeon: [name], MD;  Location: [facility] LAB;  Service: Cardiovascular;  Laterality: N/A;   VASECTOMY         Family History  Problem Relation Age of Onset   Hypertension Mother    Diabetes Maternal Aunt    Heart attack Neg Hx    Stroke Neg Hx    Colon cancer Neg Hx     Pancreatic cancer Neg Hx    Stomach cancer Neg Hx    Liver cancer Neg Hx    Esophageal cancer Neg Hx     Social History   Tobacco Use   Smoking status: Every Day    Packs/day: 0.25    Years: 22.00    Pack years: 5.50    Types: Cigarettes    Last attempt to quit: 04/16/2015    Years since quitting: 6.0   Smokeless tobacco: Never  Vaping Use   Vaping Use: Never used  Substance Use Topics   Alcohol use: Yes    Comment: 7 drinks per week max   Drug use: Yes    Frequency: 14.0 times per week    Types: Marijuana    Comment: uses marijuana twice a day    Home Medications Prior to Admission medications   Medication Sig Start Date End Date Taking? Authorizing Provider  Accu-Chek FastClix Lancets MISC USE AS DIRECTED FOUR TIMES DAILY 01/29/21 01/29/22  Edison Pace,  Crystal M, NP  allopurinol (ZYLOPRIM) 100 MG tablet Take 1 tablet (100 mg total) by mouth daily. 01/09/21   Clegg, Amy D, NP  amiodarone (PACERONE) 100 MG tablet TAKE 1 TABLET (100 MG TOTAL) BY MOUTH DAILY. Patient taking differently: Take 100 mg by mouth daily. 01/09/21 01/09/22  Clegg, Amy D, NP  apixaban (ELIQUIS) 5 MG TABS tablet TAKE 1 TABLET (5 MG TOTAL) BY MOUTH 2 (TWO) TIMES DAILY. RESUME WITH EVENING DOSE ON SUNDAY Patient taking differently: Take 5 mg by mouth 2 (two) times daily. 01/09/21 01/09/22  Clegg, Amy D, NP  blood glucose meter kit and supplies Use up to four times daily as directed. 01/27/21   Vevelyn Francois, NP  busPIRone (BUSPAR) 10 MG tablet TAKE 1 TABLET (10 MG TOTAL) BY MOUTH 2 (TWO) TIMES DAILY. Patient taking differently: Take 10 mg by mouth 2 (two) times daily. 06/24/20 06/24/21  [name], FNP  carvedilol (COREG) 6.25 MG tablet TAKE 1 TABLET (6.25 MG TOTAL) BY MOUTH 2 (TWO) TIMES DAILY. Patient taking differently: Take 6.25 mg by mouth 2 (two) times daily. 08/07/20 08/07/21  Bensimhon, Shaune Pascal, MD  cetirizine (ZYRTEC) 10 MG tablet Take 1 tablet (10 mg total) by mouth daily. 06/27/20   [name], FNP   Continuous Blood Gluc Receiver (FREESTYLE LIBRE 14 DAY READER) DEVI 1 each by Does not apply route 3 (three) times daily between meals as needed. 03/19/20   [name], FNP  Continuous Blood Gluc Sensor (GUARDIAN SENSOR 3) MISC 1 Device by Does not apply route 3 (three) times daily between meals as needed. 03/25/20   [name], FNP  empagliflozin (JARDIANCE) 10 MG TABS tablet TAKE 1 TABLET (10 MG TOTAL) BY MOUTH DAILY BEFORE BREAKFAST. Patient taking differently: Take 10 mg by mouth daily before breakfast. 12/06/20 12/06/21  Rafael Bihari, FNP  famotidine (PEPCID) 40 MG tablet TAKE 1 TABLET (40 MG TOTAL) BY MOUTH DAILY AS NEEDED FOR HEARTBURN OR INDIGESTION. Patient taking differently: Take 40 mg by mouth daily as needed for indigestion or heartburn. 12/25/20 12/25/21  Gatha Mayer, MD  ferrous sulfate 325 (65 FE) MG tablet Take 1 tablet (325 mg total) by mouth daily with breakfast. 03/04/21   Gatha Mayer, MD  fluticasone [facility]) 50 MCG/ACT nasal spray Place 2 sprays into both nostrils daily. 09/18/19   [name], FNP  gabapentin (NEURONTIN) 300 MG capsule Take 1 capsule (300 mg total) by mouth 3 (three) times daily. 06/27/20   [name], FNP  glipiZIDE (GLUCOTROL) 10 MG tablet Take 1 tablet (10 mg total) by mouth 2 (two) times daily before a meal. 04/22/21   Vevelyn Francois, NP  insulin aspart (NOVOLOG) 100 UNIT/ML FlexPen INJECT 0-15 UNIT(S) INTO THE SKIN THREE TIMES DAILY WITH MEALS VIA SLIDING SCALE Patient taking differently: Inject 0-15 Units into the skin 3 (three) times daily with meals. 11/15/20 11/15/21  [name], FNP  insulin NPH-regular Human (70-30) 100 UNIT/ML injection INJECT 38 UNITS INTO THE SKIN 2 (TWO) TIMES DAILY WITH A MEAL. Patient taking differently: Inject 38 Units into the skin 2 (two) times daily with a meal. 06/27/20 06/27/21  [name], FNP  Insulin Pen Needle 31G X 5 MM MISC USE AS DIRECTED 11/27/20 11/27/21  [name],  FNP  Insulin Syringe-Needle U-100 31G X 5/16" 0.5 ML MISC USE TO INJECT INSULIN TWICE DAILY. 09/17/20 09/17/21  [name], FNP  Magnesium Oxide 200 MG TABS Take 100  mg by mouth daily.    [provider]  metFORMIN (GLUCOPHAGE) 1000 MG tablet Take 1 tablet (1,000 mg total) by mouth 2 (two) times daily with a meal. 01/09/21   Clegg, Amy D, NP  oxyCODONE (ROXICODONE) 5 MG immediate release tablet Take 1 tablet (5 mg total) by mouth every 6 (six) hours as needed for up to 10 doses for breakthrough pain. 04/12/21   Curatolo, Adam, DO  predniSONE (DELTASONE) 10 MG tablet take 2 tablets by mouth for 2 days, then 1 tab for 2 days, then stop 04/16/21   Fenton Foy, NP  sacubitril-valsartan (ENTRESTO) 49-51 MG TAKE 1 TABLET BY MOUTH 2 (TWO) TIMES DAILY. 01/24/21   Clegg, Amy D, NP  spironolactone (ALDACTONE) 25 MG tablet Take 1 tablet (25 mg total) by mouth daily. 01/09/21 08/07/21  Clegg, Amy D, NP  torsemide (DEMADEX) 20 MG tablet TAKE 1 TABLET (20 MG TOTAL) BY MOUTH DAILY. Patient taking differently: Take 20 mg by mouth daily. 01/09/21 01/09/22  Clegg, Amy D, NP  traZODone (DESYREL) 100 MG tablet Take 1 tablet (100 mg total) by mouth at bedtime. For sleep 03/05/21   Vevelyn Francois, NP  Vitamin D, Ergocalciferol, (DRISDOL) 1.25 MG (50000 UNIT) CAPS capsule TAKE 1 CAPSULE (50,000 UNITS TOTAL) BY MOUTH ONCE A WEEK. Patient taking differently: Take 50,000 Units by mouth every Monday. 10/29/20 10/29/21  [name], FNP  Colchicine (MITIGARE) 0.6 MG CAPS Take 1 tablet by mouth 2 (two) times daily as needed (gout). 06/27/20 11/26/20  [name], FNP    Allergies    Aspirin, Bee venom, Lisinopril, and Tomato  Review of Systems   Review of Systems  Constitutional:  Negative for fever.  HENT:  Negative for ear pain and sore throat.   Eyes:  Negative for pain.  Respiratory:  Positive for shortness of breath. Negative for cough.   Cardiovascular:  Positive for chest pain.  Gastrointestinal:   Negative for abdominal pain.  Genitourinary:  Negative for flank pain.  Musculoskeletal:  Negative for back pain.  Skin:  Negative for color change and rash.  Neurological:  Negative for syncope.  All other systems reviewed and are negative.  Physical Exam Updated Vital Signs BP (!) 113/94   Pulse (!) 102   Temp 98.6 F (37 C) (Oral)   Resp (!) 30   SpO2 97%   Physical Exam Constitutional:      Appearance: He is well-developed.  HENT:     Head: Normocephalic.     Nose: Nose normal.  Eyes:     Extraocular Movements: Extraocular movements intact.  Cardiovascular:     Rate and Rhythm: Normal rate.  Pulmonary:     Effort: Pulmonary effort is normal.  Skin:    Coloration: Skin is not jaundiced.  Neurological:     Mental Status: He is alert. Mental status is at baseline.    ED Results / Procedures / Treatments   Labs (all labs ordered are listed, but only abnormal results are displayed) Labs Reviewed  BASIC METABOLIC PANEL - Abnormal; Notable for the following components:      Result Value   CO2 20 (*)    Glucose, Bld 253 (*)    BUN 43 (*)    Creatinine, Ser 1.99 (*)    GFR, Estimated 41 (*)    All other components within normal limits  CBC - Abnormal; Notable for the following components:   RDW 18.1 (*)    All other components within normal  limits  BRAIN NATRIURETIC PEPTIDE - Abnormal; Notable for the following components:   B Natriuretic Peptide 1,407.7 (*)    All other components within normal limits  TROPONIN I (HIGH SENSITIVITY) - Abnormal; Notable for the following components:   Troponin I (High Sensitivity) 74 (*)    All other components within normal limits  RESP PANEL BY RT-PCR (FLU A&B, COVID) [id number]  TROPONIN I (HIGH SENSITIVITY)    EKG None  Radiology DG Chest 2 View  Result Date: 04/29/2021 CLINICAL DATA:  Chest pain EXAM: CHEST - 2 VIEW COMPARISON:  11/21/2020 chest radiograph. FINDINGS: Stable configuration of single lead left chest ICD with  lead tip overlying the upper left heart. Stable cardiomediastinal silhouette with mild cardiomegaly. No pneumothorax. No pleural effusion. Borderline mild pulmonary edema. No consolidative airspace disease. IMPRESSION: Borderline mild congestive heart failure. Electronically Signed   By: Ilona Sorrel M.D.   On: 04/29/2021 13:07    Procedures .Critical Care  Date/Time: 04/29/2021 2:16 PM Performed by: Luna Fuse, MD Authorized by: Luna Fuse, MD   Critical care provider statement:    Critical care time (minutes):  40   Critical care time was exclusive of:  Separately billable procedures and treating other patients and teaching time   Critical care was necessary to treat or prevent imminent or life-threatening deterioration of the following conditions:  CNS failure or compromise   Medications Ordered in ED Medications  amiodarone (PACERONE) tablet 100 mg (100 mg Oral Given 04/29/21 1314)  amiodarone (NEXTERONE) 1.8 mg/mL load via infusion 150 mg (150 mg Intravenous Bolus from Bag 04/29/21 1343)    Followed by  amiodarone (NEXTERONE PREMIX) 360-4.14 MG/200ML-% (1.8 mg/mL) IV infusion (60 mg/hr Intravenous New Bag/Given 04/29/21 1339)    Followed by  amiodarone (NEXTERONE PREMIX) 360-4.14 MG/200ML-% (1.8 mg/mL) IV infusion (has no administration in time range)  sodium chloride 0.9 % bolus 500 mL (has no administration in time range)    ED Course  I have reviewed the triage vital signs and the nursing notes.  Pertinent labs & imaging results that were available during my care of the patient were reviewed by me and considered in my medical decision making (see chart for details).    MDM Rules/Calculators/A&P                           Patient found to be in atrial fibrillation with rapid ventricular rate, heart rates in the 180 bpm range.  He also has intermittent runs of ventricular tachycardia approximately 10-15 beats but given his rapid heart rate this only equates to a few  seconds. Blood pressure remained stable. Patient given his home dose of amiodarone, additionally given amiodarone IV bolus and drip.  Cardiology consultation requested for admission, will come by to see the patient.  Addendum: Heart rate is improved to only about 100 4050 bpm.  However he appears to be hypotensive now blood pressure 86/54.  Nursing staff instructed to hold amiodarone given 500 mL NS bolus.   Final Clinical Impression(s) / ED Diagnoses Final diagnoses:  Atrial fibrillation with RVR (HCC)  Nonsustained ventricular tachycardia (HCC)  Chest pain, unspecified type    Rx / DC Orders ED Discharge Orders     None        Luna Fuse, MD 04/29/21 1418    Luna Fuse, MD 04/29/21 1425

## 2021-04-30 ENCOUNTER — Inpatient Hospital Stay (HOSPITAL_COMMUNITY): Payer: Medicaid Other

## 2021-04-30 ENCOUNTER — Inpatient Hospital Stay (HOSPITAL_COMMUNITY): Payer: Medicaid Other | Admitting: Certified Registered Nurse Anesthetist

## 2021-04-30 ENCOUNTER — Inpatient Hospital Stay: Payer: Self-pay

## 2021-04-30 ENCOUNTER — Encounter (HOSPITAL_COMMUNITY)
Admission: EM | Disposition: A | Discharge status: Rehabilitation Facility | Payer: Self-pay | Source: Home / Self Care | Attending: Cardiology

## 2021-04-30 DIAGNOSIS — R079 Chest pain, unspecified: Secondary | ICD-10-CM | POA: Diagnosis not present

## 2021-04-30 DIAGNOSIS — N179 Acute kidney failure, unspecified: Secondary | ICD-10-CM | POA: Diagnosis not present

## 2021-04-30 DIAGNOSIS — E7800 Pure hypercholesterolemia, unspecified: Secondary | ICD-10-CM | POA: Diagnosis not present

## 2021-04-30 DIAGNOSIS — N139 Obstructive and reflux uropathy, unspecified: Secondary | ICD-10-CM | POA: Diagnosis not present

## 2021-04-30 DIAGNOSIS — R0789 Other chest pain: Secondary | ICD-10-CM | POA: Diagnosis not present

## 2021-04-30 DIAGNOSIS — G4733 Obstructive sleep apnea (adult) (pediatric): Secondary | ICD-10-CM | POA: Diagnosis not present

## 2021-04-30 DIAGNOSIS — Z91030 Bee allergy status: Secondary | ICD-10-CM | POA: Diagnosis not present

## 2021-04-30 DIAGNOSIS — Z9989 Dependence on other enabling machines and devices: Secondary | ICD-10-CM | POA: Diagnosis not present

## 2021-04-30 DIAGNOSIS — R34 Anuria and oliguria: Secondary | ICD-10-CM | POA: Diagnosis not present

## 2021-04-30 DIAGNOSIS — I517 Cardiomegaly: Secondary | ICD-10-CM | POA: Diagnosis not present

## 2021-04-30 DIAGNOSIS — E876 Hypokalemia: Secondary | ICD-10-CM | POA: Diagnosis not present

## 2021-04-30 DIAGNOSIS — J9601 Acute respiratory failure with hypoxia: Secondary | ICD-10-CM

## 2021-04-30 DIAGNOSIS — Z6841 Body Mass Index (BMI) 40.0 and over, adult: Secondary | ICD-10-CM | POA: Diagnosis not present

## 2021-04-30 DIAGNOSIS — N1831 Chronic kidney disease, stage 3a: Secondary | ICD-10-CM | POA: Diagnosis not present

## 2021-04-30 DIAGNOSIS — I5021 Acute systolic (congestive) heart failure: Secondary | ICD-10-CM | POA: Diagnosis not present

## 2021-04-30 DIAGNOSIS — I4891 Unspecified atrial fibrillation: Secondary | ICD-10-CM | POA: Diagnosis not present

## 2021-04-30 DIAGNOSIS — R5381 Other malaise: Secondary | ICD-10-CM | POA: Diagnosis not present

## 2021-04-30 DIAGNOSIS — F32A Depression, unspecified: Secondary | ICD-10-CM | POA: Diagnosis not present

## 2021-04-30 DIAGNOSIS — Z515 Encounter for palliative care: Secondary | ICD-10-CM | POA: Diagnosis not present

## 2021-04-30 DIAGNOSIS — I5043 Acute on chronic combined systolic (congestive) and diastolic (congestive) heart failure: Secondary | ICD-10-CM | POA: Diagnosis not present

## 2021-04-30 DIAGNOSIS — Z7189 Other specified counseling: Secondary | ICD-10-CM | POA: Diagnosis not present

## 2021-04-30 DIAGNOSIS — J810 Acute pulmonary edema: Secondary | ICD-10-CM | POA: Diagnosis not present

## 2021-04-30 DIAGNOSIS — J9811 Atelectasis: Secondary | ICD-10-CM | POA: Diagnosis not present

## 2021-04-30 DIAGNOSIS — M25561 Pain in right knee: Secondary | ICD-10-CM | POA: Diagnosis not present

## 2021-04-30 DIAGNOSIS — I5023 Acute on chronic systolic (congestive) heart failure: Secondary | ICD-10-CM | POA: Diagnosis not present

## 2021-04-30 DIAGNOSIS — N183 Chronic kidney disease, stage 3 unspecified: Secondary | ICD-10-CM | POA: Diagnosis not present

## 2021-04-30 DIAGNOSIS — J15211 Pneumonia due to Methicillin susceptible Staphylococcus aureus: Secondary | ICD-10-CM | POA: Diagnosis not present

## 2021-04-30 DIAGNOSIS — R570 Cardiogenic shock: Secondary | ICD-10-CM | POA: Diagnosis not present

## 2021-04-30 DIAGNOSIS — I959 Hypotension, unspecified: Secondary | ICD-10-CM | POA: Diagnosis not present

## 2021-04-30 DIAGNOSIS — K5900 Constipation, unspecified: Secondary | ICD-10-CM | POA: Diagnosis not present

## 2021-04-30 DIAGNOSIS — J9602 Acute respiratory failure with hypercapnia: Secondary | ICD-10-CM | POA: Diagnosis not present

## 2021-04-30 DIAGNOSIS — E1165 Type 2 diabetes mellitus with hyperglycemia: Secondary | ICD-10-CM | POA: Diagnosis not present

## 2021-04-30 DIAGNOSIS — E1122 Type 2 diabetes mellitus with diabetic chronic kidney disease: Secondary | ICD-10-CM | POA: Diagnosis not present

## 2021-04-30 DIAGNOSIS — I472 Ventricular tachycardia: Secondary | ICD-10-CM | POA: Diagnosis not present

## 2021-04-30 DIAGNOSIS — I428 Other cardiomyopathies: Secondary | ICD-10-CM | POA: Diagnosis not present

## 2021-04-30 DIAGNOSIS — E870 Hyperosmolality and hypernatremia: Secondary | ICD-10-CM | POA: Diagnosis not present

## 2021-04-30 DIAGNOSIS — J45909 Unspecified asthma, uncomplicated: Secondary | ICD-10-CM | POA: Diagnosis not present

## 2021-04-30 DIAGNOSIS — F17210 Nicotine dependence, cigarettes, uncomplicated: Secondary | ICD-10-CM | POA: Diagnosis not present

## 2021-04-30 DIAGNOSIS — J811 Chronic pulmonary edema: Secondary | ICD-10-CM | POA: Diagnosis not present

## 2021-04-30 DIAGNOSIS — F419 Anxiety disorder, unspecified: Secondary | ICD-10-CM | POA: Diagnosis not present

## 2021-04-30 DIAGNOSIS — M109 Gout, unspecified: Secondary | ICD-10-CM | POA: Diagnosis not present

## 2021-04-30 DIAGNOSIS — R21 Rash and other nonspecific skin eruption: Secondary | ICD-10-CM | POA: Diagnosis not present

## 2021-04-30 DIAGNOSIS — Z01810 Encounter for preprocedural cardiovascular examination: Secondary | ICD-10-CM | POA: Diagnosis not present

## 2021-04-30 DIAGNOSIS — N170 Acute kidney failure with tubular necrosis: Secondary | ICD-10-CM | POA: Diagnosis not present

## 2021-04-30 DIAGNOSIS — I4819 Other persistent atrial fibrillation: Secondary | ICD-10-CM | POA: Diagnosis not present

## 2021-04-30 DIAGNOSIS — Z905 Acquired absence of kidney: Secondary | ICD-10-CM | POA: Diagnosis not present

## 2021-04-30 DIAGNOSIS — I509 Heart failure, unspecified: Secondary | ICD-10-CM | POA: Diagnosis not present

## 2021-04-30 DIAGNOSIS — Z20822 Contact with and (suspected) exposure to covid-19: Secondary | ICD-10-CM | POA: Diagnosis not present

## 2021-04-30 DIAGNOSIS — I480 Paroxysmal atrial fibrillation: Secondary | ICD-10-CM | POA: Diagnosis not present

## 2021-04-30 DIAGNOSIS — G4700 Insomnia, unspecified: Secondary | ICD-10-CM | POA: Diagnosis not present

## 2021-04-30 DIAGNOSIS — I130 Hypertensive heart and chronic kidney disease with heart failure and stage 1 through stage 4 chronic kidney disease, or unspecified chronic kidney disease: Secondary | ICD-10-CM | POA: Diagnosis not present

## 2021-04-30 HISTORY — PX: TEE WITHOUT CARDIOVERSION: SHX5443

## 2021-04-30 HISTORY — PX: CARDIOVERSION: SHX1299

## 2021-04-30 LAB — LACTIC ACID, PLASMA
Lactic Acid, Venous: 2.7 mmol/L (ref 0.5–1.9)
Lactic Acid, Venous: 3 mmol/L (ref 0.5–1.9)

## 2021-04-30 LAB — POCT I-STAT 7, (LYTES, BLD GAS, ICA,H+H)
Acid-base deficit: 9 mmol/L — ABNORMAL HIGH (ref 0.0–2.0)
Acid-base deficit: 9 mmol/L — ABNORMAL HIGH (ref 0.0–2.0)
Bicarbonate: 19.9 mmol/L — ABNORMAL LOW (ref 20.0–28.0)
Bicarbonate: 18.6 mmol/L — ABNORMAL LOW (ref 20.0–28.0)
Calcium, Ion: 1.31 mmol/L (ref 1.15–1.40)
Calcium, Ion: 1.34 mmol/L (ref 1.15–1.40)
HCT: 51 % (ref 39.0–52.0)
HCT: 50 % (ref 39.0–52.0)
Hemoglobin: 17.3 g/dL — ABNORMAL HIGH (ref 13.0–17.0)
Hemoglobin: 17 g/dL (ref 13.0–17.0)
O2 Saturation: 86 %
O2 Saturation: 93 %
Patient temperature: 98.9
Patient temperature: 98.6
Potassium: 4.7 mmol/L (ref 3.5–5.1)
Potassium: 4.6 mmol/L (ref 3.5–5.1)
Sodium: 136 mmol/L (ref 135–145)
Sodium: 139 mmol/L (ref 135–145)
TCO2: 21 mmol/L — ABNORMAL LOW (ref 22–32)
TCO2: 20 mmol/L — ABNORMAL LOW (ref 22–32)
pCO2 arterial: 54 mmHg — ABNORMAL HIGH (ref 32.0–48.0)
pCO2 arterial: 47.3 mmHg (ref 32.0–48.0)
pH, Arterial: 7.175 — CL (ref 7.350–7.450)
pH, Arterial: 7.203 — ABNORMAL LOW (ref 7.350–7.450)
pO2, Arterial: 66 mmHg — ABNORMAL LOW (ref 83.0–108.0)
pO2, Arterial: 82 mmHg — ABNORMAL LOW (ref 83.0–108.0)

## 2021-04-30 LAB — COMPREHENSIVE METABOLIC PANEL
ALT: 135 U/L — ABNORMAL HIGH (ref 0–44)
AST: 43 U/L — ABNORMAL HIGH (ref 15–41)
Albumin: 3.2 g/dL — ABNORMAL LOW (ref 3.5–5.0)
Alkaline Phosphatase: 55 U/L (ref 38–126)
Anion gap: 13 (ref 5–15)
BUN: 44 mg/dL — ABNORMAL HIGH (ref 6–20)
CO2: 16 mmol/L — ABNORMAL LOW (ref 22–32)
Calcium: 9.3 mg/dL (ref 8.9–10.3)
Chloride: 105 mmol/L (ref 98–111)
Creatinine, Ser: 2.2 mg/dL — ABNORMAL HIGH (ref 0.61–1.24)
GFR, Estimated: 36 mL/min — ABNORMAL LOW (ref >60)
Glucose, Bld: 322 mg/dL — ABNORMAL HIGH (ref 70–99)
Potassium: 4.4 mmol/L (ref 3.5–5.1)
Sodium: 134 mmol/L — ABNORMAL LOW (ref 135–145)
Total Bilirubin: 0.7 mg/dL (ref 0.3–1.2)
Total Protein: 6.8 g/dL (ref 6.5–8.1)

## 2021-04-30 LAB — HEMOGLOBIN A1C
Hgb A1c MFr Bld: 9.4 % — ABNORMAL HIGH (ref 4.8–5.6)
Mean Plasma Glucose: 223 mg/dL

## 2021-04-30 LAB — COOXEMETRY PANEL
Carboxyhemoglobin: 0.3 % — ABNORMAL LOW (ref 0.5–1.5)
Carboxyhemoglobin: 0.6 % (ref 0.5–1.5)
Methemoglobin: 1.3 % (ref 0.0–1.5)
Methemoglobin: 1.2 % (ref 0.0–1.5)
O2 Saturation: 68.3 %
O2 Saturation: 62.2 %
Total hemoglobin: 16.5 g/dL — ABNORMAL HIGH (ref 12.0–16.0)
Total hemoglobin: 15.8 g/dL (ref 12.0–16.0)

## 2021-04-30 LAB — ECHOCARDIOGRAM COMPLETE
Area-P 1/2: 7.44 cm2
Height: 68 in
S' Lateral: 6.2 cm
Weight: 5391.99 oz

## 2021-04-30 LAB — GLUCOSE, CAPILLARY
Glucose-Capillary: 176 mg/dL — ABNORMAL HIGH (ref 70–99)
Glucose-Capillary: 185 mg/dL — ABNORMAL HIGH (ref 70–99)
Glucose-Capillary: 261 mg/dL — ABNORMAL HIGH (ref 70–99)
Glucose-Capillary: 337 mg/dL — ABNORMAL HIGH (ref 70–99)
Glucose-Capillary: 360 mg/dL — ABNORMAL HIGH (ref 70–99)
Glucose-Capillary: 387 mg/dL — ABNORMAL HIGH (ref 70–99)
Glucose-Capillary: 431 mg/dL — ABNORMAL HIGH (ref 70–99)

## 2021-04-30 LAB — MAGNESIUM: Magnesium: 2.1 mg/dL (ref 1.7–2.4)

## 2021-04-30 LAB — MRSA NEXT GEN BY PCR, NASAL: MRSA by PCR Next Gen: NOT DETECTED

## 2021-04-30 LAB — TSH: TSH: 2.519 u[IU]/mL (ref 0.350–4.500)

## 2021-04-30 LAB — PROCALCITONIN: Procalcitonin: 0.47 ng/mL

## 2021-04-30 LAB — CBG MONITORING, ED: Glucose-Capillary: 310 mg/dL — ABNORMAL HIGH (ref 70–99)

## 2021-04-30 SURGERY — ECHOCARDIOGRAM, TRANSESOPHAGEAL
Anesthesia: Monitor Anesthesia Care

## 2021-04-30 MED ORDER — TRAZODONE HCL 50 MG PO TABS
100.0000 mg | ORAL_TABLET | Freq: Every day | ORAL | Status: DC
  Administered 2021-04-30: 100 mg via ORAL
  Filled 2021-04-30: qty 2

## 2021-04-30 MED ORDER — SODIUM CHLORIDE 0.9 % IV SOLN
250.0000 mL | INTRAVENOUS | Status: DC | PRN
  Administered 2021-04-30 (×2): 250 mL via INTRAVENOUS

## 2021-04-30 MED ORDER — OXYCODONE HCL 5 MG PO TABS: 5.0000 mg | ORAL_TABLET | Freq: Four times a day (QID) | ORAL | Status: DC | PRN

## 2021-04-30 MED ORDER — ALPRAZOLAM 0.25 MG PO TABS: 0.2500 mg | ORAL_TABLET | Freq: Two times a day (BID) | ORAL | Status: DC | PRN

## 2021-04-30 MED ORDER — SODIUM BICARBONATE 8.4 % IV SOLN
INTRAVENOUS | Status: AC
  Filled 2021-04-30: qty 50

## 2021-04-30 MED ORDER — PHENYLEPHRINE HCL-NACL 10-0.9 MG/250ML-% IV SOLN: 0.0000 ug/min | INTRAVENOUS | Status: DC

## 2021-04-30 MED ORDER — CHLORHEXIDINE GLUCONATE CLOTH 2 % EX PADS
6.0000 | MEDICATED_PAD | Freq: Every day | CUTANEOUS | Status: DC
  Administered 2021-04-30 – 2021-05-21 (×23): 6 via TOPICAL

## 2021-04-30 MED ORDER — APIXABAN 5 MG PO TABS
5.0000 mg | ORAL_TABLET | Freq: Two times a day (BID) | ORAL | Status: DC
  Administered 2021-04-30: 5 mg

## 2021-04-30 MED ORDER — ETOMIDATE 2 MG/ML IV SOLN
INTRAVENOUS | Status: AC
  Administered 2021-04-30: 20 mg via INTRAVENOUS
  Filled 2021-04-30: qty 10

## 2021-04-30 MED ORDER — FUROSEMIDE 10 MG/ML IJ SOLN
12.0000 mg/h | INTRAVENOUS | Status: DC
  Administered 2021-04-30 – 2021-05-05 (×10): 12 mg/h via INTRAVENOUS
  Filled 2021-04-30 (×11): qty 20

## 2021-04-30 MED ORDER — FUROSEMIDE 10 MG/ML IJ SOLN
160.0000 mg | Freq: Once | INTRAVENOUS | Status: DC
  Filled 2021-04-30: qty 16

## 2021-04-30 MED ORDER — FAMOTIDINE 20 MG PO TABS: 40.0000 mg | ORAL_TABLET | Freq: Every day | ORAL | Status: DC | PRN

## 2021-04-30 MED ORDER — SODIUM CHLORIDE 0.9% FLUSH
10.0000 mL | Freq: Two times a day (BID) | INTRAVENOUS | Status: DC
  Administered 2021-05-01 (×2): 10 mL
  Administered 2021-05-02: 40 mL
  Administered 2021-05-02 – 2021-05-03 (×2): 10 mL
  Administered 2021-05-03: 40 mL
  Administered 2021-05-04 (×2): 10 mL
  Administered 2021-05-06: 30 mL
  Administered 2021-05-07 – 2021-05-17 (×22): 10 mL
  Administered 2021-05-18: 20 mL
  Administered 2021-05-18: 10 mL
  Administered 2021-05-19: 20 mL
  Administered 2021-05-20 – 2021-05-21 (×3): 10 mL

## 2021-04-30 MED ORDER — ONDANSETRON HCL 4 MG/2ML IJ SOLN
INTRAMUSCULAR | Status: AC
  Filled 2021-04-30: qty 2

## 2021-04-30 MED ORDER — NITROGLYCERIN 0.4 MG SL SUBL: 0.4000 mg | SUBLINGUAL_TABLET | SUBLINGUAL | Status: DC | PRN

## 2021-04-30 MED ORDER — ACETAMINOPHEN 325 MG PO TABS: 650.0000 mg | ORAL_TABLET | ORAL | Status: DC | PRN

## 2021-04-30 MED ORDER — SODIUM BICARBONATE 8.4 % IV SOLN
100.0000 meq | Freq: Once | INTRAVENOUS | Status: AC
  Administered 2021-04-30: 100 meq via INTRAVENOUS
  Filled 2021-04-30: qty 50

## 2021-04-30 MED ORDER — LIDOCAINE HCL (PF) 1 % IJ SOLN
INTRAMUSCULAR | Status: AC
  Administered 2021-04-30: 5 mL
  Filled 2021-04-30: qty 5

## 2021-04-30 MED ORDER — ROCURONIUM BROMIDE 50 MG/5ML IV SOLN: 100.0000 mg | Freq: Once | INTRAVENOUS | Status: AC

## 2021-04-30 MED ORDER — FUROSEMIDE 10 MG/ML IJ SOLN
80.0000 mg | Freq: Two times a day (BID) | INTRAMUSCULAR | Status: DC
  Administered 2021-04-30: 80 mg via INTRAVENOUS
  Filled 2021-04-30: qty 8

## 2021-04-30 MED ORDER — BUSPIRONE HCL 10 MG PO TABS
10.0000 mg | ORAL_TABLET | Freq: Two times a day (BID) | ORAL | Status: DC
  Administered 2021-04-30: 10 mg via ORAL
  Filled 2021-04-30 (×2): qty 1

## 2021-04-30 MED ORDER — MIDAZOLAM HCL 2 MG/2ML IJ SOLN: 4.0000 mg | Freq: Once | INTRAMUSCULAR | Status: AC

## 2021-04-30 MED ORDER — ETOMIDATE 2 MG/ML IV SOLN: 20.0000 mg | Freq: Once | INTRAVENOUS | Status: AC

## 2021-04-30 MED ORDER — SODIUM CHLORIDE 0.9% FLUSH: 10.0000 mL | INTRAVENOUS | Status: DC | PRN

## 2021-04-30 MED ORDER — FENTANYL BOLUS VIA INFUSION
50.0000 ug | INTRAVENOUS | Status: DC | PRN
  Administered 2021-05-03: 100 ug via INTRAVENOUS
  Filled 2021-04-30: qty 100

## 2021-04-30 MED ORDER — POLYETHYLENE GLYCOL 3350 17 G PO PACK
17.0000 g | PACK | Freq: Every day | ORAL | Status: DC
  Administered 2021-05-02 – 2021-05-07 (×6): 17 g
  Filled 2021-04-30 (×7): qty 1

## 2021-04-30 MED ORDER — KETAMINE HCL 10 MG/ML IJ SOLN
INTRAMUSCULAR | Status: DC | PRN
  Administered 2021-04-30: 30 mg via INTRAVENOUS

## 2021-04-30 MED ORDER — ORAL CARE MOUTH RINSE
15.0000 mL | OROMUCOSAL | Status: DC
  Administered 2021-04-30 – 2021-05-04 (×42): 15 mL via OROMUCOSAL

## 2021-04-30 MED ORDER — APIXABAN 5 MG PO TABS
5.0000 mg | ORAL_TABLET | Freq: Two times a day (BID) | ORAL | Status: DC
  Administered 2021-04-30: 5 mg via ORAL
  Filled 2021-04-30 (×2): qty 1

## 2021-04-30 MED ORDER — FUROSEMIDE 10 MG/ML IJ SOLN
80.0000 mg | Freq: Once | INTRAMUSCULAR | Status: AC
  Administered 2021-04-30: 80 mg via INTRAVENOUS
  Filled 2021-04-30: qty 8

## 2021-04-30 MED ORDER — SODIUM CHLORIDE 0.9% FLUSH: 3.0000 mL | INTRAVENOUS | Status: DC | PRN

## 2021-04-30 MED ORDER — BUSPIRONE HCL 10 MG PO TABS
10.0000 mg | ORAL_TABLET | Freq: Two times a day (BID) | ORAL | Status: DC
  Administered 2021-04-30: 10 mg

## 2021-04-30 MED ORDER — LORATADINE 10 MG PO TABS
10.0000 mg | ORAL_TABLET | Freq: Every day | ORAL | Status: DC
  Administered 2021-04-30: 10 mg via ORAL
  Filled 2021-04-30: qty 1

## 2021-04-30 MED ORDER — SODIUM BICARBONATE 8.4 % IV SOLN
INTRAVENOUS | Status: DC
  Filled 2021-04-30: qty 1000

## 2021-04-30 MED ORDER — TORSEMIDE 20 MG PO TABS
20.0000 mg | ORAL_TABLET | Freq: Every day | ORAL | Status: DC
  Administered 2021-04-30: 20 mg via ORAL
  Filled 2021-04-30: qty 1

## 2021-04-30 MED ORDER — KETAMINE HCL 50 MG/5ML IJ SOSY
PREFILLED_SYRINGE | INTRAMUSCULAR | Status: AC
  Filled 2021-04-30: qty 10

## 2021-04-30 MED ORDER — FENTANYL CITRATE (PF) 100 MCG/2ML IJ SOLN
50.0000 ug | Freq: Once | INTRAMUSCULAR | Status: AC
  Administered 2021-04-30: 50 ug via INTRAVENOUS

## 2021-04-30 MED ORDER — FENTANYL 2500MCG IN NS 250ML (10MCG/ML) PREMIX INFUSION
50.0000 ug/h | INTRAVENOUS | Status: DC
  Administered 2021-04-30: 50 ug/h via INTRAVENOUS
  Administered 2021-05-01 – 2021-05-02 (×2): 200 ug/h via INTRAVENOUS
  Administered 2021-05-02: 175 ug/h via INTRAVENOUS
  Administered 2021-05-03: 100 ug/h via INTRAVENOUS
  Administered 2021-05-04: 50 ug/h via INTRAVENOUS
  Filled 2021-04-30 (×6): qty 250

## 2021-04-30 MED ORDER — PERFLUTREN LIPID MICROSPHERE
1.0000 mL | INTRAVENOUS | Status: AC | PRN
  Administered 2021-04-30: 2 mL via INTRAVENOUS
  Filled 2021-04-30: qty 10

## 2021-04-30 MED ORDER — NOREPINEPHRINE 16 MG/250ML-% IV SOLN
0.0000 ug/min | INTRAVENOUS | Status: DC
  Administered 2021-04-30: 8 ug/min via INTRAVENOUS
  Filled 2021-04-30 (×3): qty 250

## 2021-04-30 MED ORDER — EMPAGLIFLOZIN 10 MG PO TABS: 10.0000 mg | ORAL_TABLET | Freq: Every day | ORAL | Status: DC

## 2021-04-30 MED ORDER — SODIUM CHLORIDE 0.9 % IV SOLN: INTRAVENOUS | Status: DC

## 2021-04-30 MED ORDER — ONDANSETRON HCL 4 MG/2ML IJ SOLN: 4.0000 mg | Freq: Four times a day (QID) | INTRAMUSCULAR | Status: DC | PRN

## 2021-04-30 MED ORDER — ROCURONIUM BROMIDE 10 MG/ML (PF) SYRINGE
PREFILLED_SYRINGE | INTRAVENOUS | Status: AC
  Administered 2021-04-30: 100 mg via INTRAVENOUS
  Filled 2021-04-30: qty 10

## 2021-04-30 MED ORDER — SOD CITRATE-CITRIC ACID 500-334 MG/5ML PO SOLN
30.0000 mL | Freq: Once | ORAL | Status: AC
  Administered 2021-04-30: 30 mL via ORAL

## 2021-04-30 MED ORDER — FENTANYL CITRATE (PF) 100 MCG/2ML IJ SOLN
INTRAMUSCULAR | Status: AC
  Administered 2021-04-30: 100 ug via INTRAVENOUS
  Filled 2021-04-30: qty 2

## 2021-04-30 MED ORDER — PHENYLEPHRINE HCL-NACL 10-0.9 MG/250ML-% IV SOLN
INTRAVENOUS | Status: AC
  Filled 2021-04-30: qty 250

## 2021-04-30 MED ORDER — DOCUSATE SODIUM 50 MG/5ML PO LIQD
100.0000 mg | Freq: Two times a day (BID) | ORAL | Status: DC
  Administered 2021-04-30 – 2021-05-08 (×15): 100 mg
  Filled 2021-04-30 (×15): qty 10

## 2021-04-30 MED ORDER — FENTANYL CITRATE (PF) 100 MCG/2ML IJ SOLN
100.0000 ug | Freq: Once | INTRAMUSCULAR | Status: AC
Start: 2021-04-30 — End: 2021-04-30

## 2021-04-30 MED ORDER — INSULIN ASPART 100 UNIT/ML IJ SOLN
0.0000 [IU] | Freq: Three times a day (TID) | INTRAMUSCULAR | Status: DC
Start: 2021-04-30 — End: 2021-04-30
  Administered 2021-04-30: 11 [IU] via SUBCUTANEOUS

## 2021-04-30 MED ORDER — MIDAZOLAM HCL 2 MG/2ML IJ SOLN
INTRAMUSCULAR | Status: AC
  Administered 2021-04-30: 4 mg via INTRAVENOUS
  Filled 2021-04-30: qty 4

## 2021-04-30 MED ORDER — CHLORHEXIDINE GLUCONATE 0.12% ORAL RINSE (MEDLINE KIT)
15.0000 mL | Freq: Two times a day (BID) | OROMUCOSAL | Status: DC
  Administered 2021-04-30 – 2021-05-04 (×9): 15 mL via OROMUCOSAL

## 2021-04-30 MED ORDER — ZOLPIDEM TARTRATE 5 MG PO TABS
5.0000 mg | ORAL_TABLET | Freq: Every evening | ORAL | Status: DC | PRN
Start: 2021-04-30 — End: 2021-05-01

## 2021-04-30 MED ORDER — PROPOFOL 10 MG/ML IV BOLUS
INTRAVENOUS | Status: DC | PRN
  Administered 2021-04-30: 50 mg via INTRAVENOUS
  Administered 2021-04-30: 50 ug/kg/min via INTRAVENOUS

## 2021-04-30 MED ORDER — INSULIN REGULAR(HUMAN) IN NACL 100-0.9 UT/100ML-% IV SOLN
INTRAVENOUS | Status: DC
  Administered 2021-04-30: 13 [IU]/h via INTRAVENOUS
  Administered 2021-05-01: 5 [IU]/h via INTRAVENOUS
  Filled 2021-04-30 (×2): qty 100

## 2021-04-30 MED ORDER — ONDANSETRON HCL 4 MG/2ML IJ SOLN
4.0000 mg | Freq: Once | INTRAMUSCULAR | Status: AC
  Administered 2021-04-30: 4 mg via INTRAVENOUS

## 2021-04-30 MED ORDER — LORATADINE 10 MG PO TABS
10.0000 mg | ORAL_TABLET | Freq: Every day | ORAL | Status: DC
  Administered 2021-05-01: 10 mg
  Filled 2021-04-30: qty 1

## 2021-04-30 MED ORDER — SODIUM CHLORIDE 0.9% FLUSH
3.0000 mL | Freq: Two times a day (BID) | INTRAVENOUS | Status: DC
Start: 2021-04-30 — End: 2021-05-22
  Administered 2021-05-01 – 2021-05-21 (×28): 3 mL via INTRAVENOUS

## 2021-04-30 MED ORDER — DEXTROSE 50 % IV SOLN: 0.0000 mL | INTRAVENOUS | Status: DC | PRN

## 2021-04-30 MED ORDER — NOREPINEPHRINE 4 MG/250ML-% IV SOLN
0.0000 ug/min | INTRAVENOUS | Status: DC
Start: 2021-04-30 — End: 2021-04-30
  Administered 2021-04-30: 2 ug/min via INTRAVENOUS
  Filled 2021-04-30: qty 250

## 2021-04-30 MED ORDER — INSULIN ASPART PROT & ASPART (70-30 MIX) 100 UNIT/ML ~~LOC~~ SUSP
38.0000 [IU] | Freq: Two times a day (BID) | SUBCUTANEOUS | Status: DC
  Filled 2021-04-30: qty 10

## 2021-04-30 MED ORDER — PROPOFOL 1000 MG/100ML IV EMUL
0.0000 ug/kg/min | INTRAVENOUS | Status: DC
  Administered 2021-04-30: 15 ug/kg/min via INTRAVENOUS
  Administered 2021-04-30: 5 ug/kg/min via INTRAVENOUS
  Administered 2021-05-01: 15 ug/kg/min via INTRAVENOUS
  Administered 2021-05-01 (×2): 50 ug/kg/min via INTRAVENOUS
  Administered 2021-05-01 (×2): 40 ug/kg/min via INTRAVENOUS
  Administered 2021-05-01: 50 ug/kg/min via INTRAVENOUS
  Administered 2021-05-02 (×4): 25 ug/kg/min via INTRAVENOUS
  Administered 2021-05-02: 40 ug/kg/min via INTRAVENOUS
  Administered 2021-05-03: 25 ug/kg/min via INTRAVENOUS
  Administered 2021-05-03: 30 ug/kg/min via INTRAVENOUS
  Administered 2021-05-03 (×4): 25 ug/kg/min via INTRAVENOUS
  Administered 2021-05-04: 15 ug/kg/min via INTRAVENOUS
  Administered 2021-05-04 – 2021-05-05 (×6): 25 ug/kg/min via INTRAVENOUS
  Filled 2021-04-30 (×3): qty 100
  Filled 2021-04-30: qty 200
  Filled 2021-04-30 (×11): qty 100
  Filled 2021-04-30: qty 200
  Filled 2021-04-30 (×11): qty 100

## 2021-04-30 NOTE — Progress Notes (Signed)
  Echocardiogram 2D Echocardiogram has been performed.  Jeffrey [name] 04/30/2021, 3:10 PM

## 2021-04-30 NOTE — Progress Notes (Signed)
Inpatient Diabetes Program Recommendations  AACE/ADA: New Consensus Statement on Inpatient Glycemic Control (2015)  Target Ranges:  Prepandial:   less than 140 mg/dL      Peak postprandial:   less than 180 mg/dL (1-2 hours)      Critically ill patients:  140 - 180 mg/dL   Lab Results  Component Value Date   GLUCAP 310 (H) 04/30/2021   HGBA1C 8.6 (H) 10/28/2020    Review of Glycemic Control  Diabetes history: DM2 Outpatient Diabetes medications: 70/30 38 units BID, Novolog 0-15 units TID, Glipizide 10 mg BID, Jardiance 10 mg QD, Metformin 1000 mg BID Current orders for Inpatient glycemic control: 70/30 38 units BID, Novolog 0-15 units TID  Inpatient Diabetes Program Recommendations:    Please hold 70/30 while NPO as there is meal coverage in this insulin. If remains NPO please consider adding Levemir 20 units BID (80% of home dose) and discontinuing 70/30.   Spoke with patient.  He confirmed above home medications.  Denies difficulties obtaining medications.  He is current with his PCP.  Does not know last A1C.  Current A1C is pending.    Will continue to follow while inpatient.  Thank you, Reche Dixon, RN, BSN Diabetes Coordinator Inpatient Diabetes Program [phone] (team pager from 8a-5p)

## 2021-04-30 NOTE — CV Procedure (Signed)
Procedure: TEE  Indication: Atrial fibrillation, missed Eliquis doses.   Sedation: Per anesthesiology  Findings: Please echo section for full report.  This was a very limited TEE given respiratory distress.  Moderate-severe left atrial enlargement, no thrombus in the left atrium or appendage.  The appendage was small.   Loralie Champagne 04/30/2021 1:05 PM

## 2021-04-30 NOTE — Progress Notes (Signed)
Fentanyl infusion titrated from 50 mcg directly to 150 mcg, and propofol from 5 mcg to 25 mcg to provide generous sedation per VO Dr. Tamala Julian.

## 2021-04-30 NOTE — Interval H&P Note (Signed)
History and Physical Interval Note:  04/30/2021 12:44 PM  Jeffrey [name]  has presented today for surgery, with the diagnosis of A-FIB.  The various methods of treatment have been discussed with the patient and family. After consideration of risks, benefits and other options for treatment, the patient has consented to  Procedure(s): TRANSESOPHAGEAL ECHOCARDIOGRAM (TEE) (N/A) CARDIOVERSION (N/A) as a surgical intervention.  The patient's history has been reviewed, patient examined, no change in status, stable for surgery.  I have reviewed the patient's chart and labs.  Questions were answered to the patient's satisfaction.     [name] [employer]

## 2021-04-30 NOTE — Consult Note (Addendum)
Cardiology Consultation:   Patient ID: [name] MRN: [id number]; DOB: 07/30/1976  Admit date: 04/29/2021 Date of Consult: 04/30/2021  PCP:  [name], FNP (Inactive)   [facility] Providers Cardiologist:  Glori Bickers, MD  Electrophysiologist:  Virl Axe, MD  {    Patient Profile:   Jeffrey [name] is a 45 y.o. male with a hx of HTN, renal Ca (s/p  R nephrectomy), CKD (III), DM, OSA, morbid obesity, gout, NICM, chronic CHF (systolic), S-ICD, AFib who is being seen 04/30/2021 for the evaluation of rapid at the request of Dr. Radford Pax.   Device information BSci S-ICD implanted 05/17/2015 > gen change 0/17/5102 Gen change complicated by flash pulmonary edema suffered traumatic intubation/aspiration + hx of appropriate shock 2019 for VT  AFib hx Diagnosed 2018 AAD hx Amiodarone started 2018 (also has hx of VT w/shock 2019)  History of Present Illness:   Jeffrey [name] was admitted yesterday with c/o increasing SOB over the last 4 days or so, was found in rapid AFib, and volume OL.  H&P noted pt had missed his medicines on Saturday 2/2 nausea and missed them yesterday as well, this included his Eliquis so DCCV in the ER was not pursued. Jeffrey was given amio bolus and gtt to aid in rate control and thoughts towards TEE/DCCV perhaps today. Also given IV lasix to have HF and EP teams follow.  Mild abn Trop not felt to be ACS and likely demand in setting of CKD and HF/RVR.  LABS K+ 3.6 > 4.4 Mag 2.1 BUN/Creat 43/1.99 > 44/2.20 (baseline looks about 1.5) AST 43 ALT 135 BNP 1407 HS Trop 74 > 64 WBC 9.6 H/H 15/45 Plts 221 TSH 2.519  Home rate/rhythm meds Amiodarone 180m daily Coreg 6.237mBID  Jeffrey reports up to Sunday Jeffrey has been feeling "fine", no CP, palpitations or cardiac awareness, Jeffrey has some baseline SOB/DOE but on Sunday when Jeffrey felt himself go into AFib, SOB has steadily worsened. Jeffrey can feel his heart racing, no CP but is uncomfortable/perhaps a little  heavy. No near syncope or syncope, with worsening symptoms since Sunday has felt a bit lightheaded on/off, no ICD shocks   Past Medical History:  Diagnosis Date   Anxiety    Aspirin allergy    Childhood asthma    Chronic systolic CHF (congestive heart failure) (HC[facility]   a. EF 20-25% in 2012. b. EF 45-50% in 10/2011 with nonischemic nuc - presumed NICM. c. 12/2014 Echo: Sev depressed LV fxn, sev dil LV, mild LVH, mild MR, sev dil LA, mildly reduced RV fxn.   CKD (chronic kidney disease) stage 2, GFR 60-89 ml/min    H/O vasectomy 12/2019   High cholesterol    Hypertension    Morbid obesity (HCJuarez   Nephrolithiasis    OSA on CPAP    Paroxysmal atrial fibrillation (HCRussell   Presumed NICM    a. 04/2014 Myoview: EF 26%, glob HK, sev glob HK, ? prior infarct;  b. Never cathed 2/2 CKD.   Renal cell carcinoma ([facility]   a. s/p Rt robotic assisted partial converted to radical nephrectomy on 01/2013.   Troponin level elevated    a. 04/2014, 12/2014: felt due to CHF.   Type II diabetes mellitus (HCDell[place]   Ventricular tachycardia (HCTwin Brid[place]   a. appropriate ICD therapy 12/2017    Past Surgical History:  Procedure Laterality Date   APPENDECTOMY  07/2004   BIOPSY  02/10/2021   Procedure: BIOPSY;  Surgeon: Gatha Mayer, MD;  Location: [facility];  Service: Endoscopy;;   CARDIAC CATHETERIZATION N/A 05/17/2015   Procedure: Right/Left Heart Cath and Coronary Angiography;  Surgeon: Jolaine Artist, MD;  Location: [place] [facility];  Service: Cardiovascular;  Laterality: N/A;   COLONOSCOPY WITH PROPOFOL N/A 02/10/2021   Procedure: COLONOSCOPY WITH PROPOFOL;  Surgeon: Gatha Mayer, MD;  Location: [facility];  Service: Endoscopy;  Laterality: N/A;   EP IMPLANTABLE DEVICE N/A 05/17/2015   Procedure: SubQ ICD Implant;  Surgeon: Lovell Nuttall Meredith Leeds, MD;  Location: [place] [facility];  Service: Cardiovascular;  Laterality: N/A;   ESOPHAGOGASTRODUODENOSCOPY (EGD) WITH PROPOFOL N/A 02/10/2021   Procedure:  ESOPHAGOGASTRODUODENOSCOPY (EGD) WITH PROPOFOL;  Surgeon: Gatha Mayer, MD;  Location: [facility];  Service: Endoscopy;  Laterality: N/A;   ROBOTIC ASSISTED LAPAROSCOPIC LYSIS OF ADHESION  01/13/2013   Procedure: ROBOTIC ASSISTED LAPAROSCOPIC LYSIS OF ADHESION EXTENSIVE;  Surgeon: Alexis Frock, MD;  Location: [facility];  Service: Urology;;   ROBOTIC ASSITED PARTIAL NEPHRECTOMY Right 01/13/2013   Procedure: ROBOTIC ASSITED PARTIAL NEPHRECTOMY CONVERTED TO ROBOTIC ASSISTED RIGHT RADICAL NEPHRECTOMY;  Surgeon: Alexis Frock, MD;  Location: [facility];  Service: Urology;  Laterality: Right;   SUBQ ICD CHANGEOUT N/A 05/16/2020   Procedure: SUBQ ICD CHANGEOUT;  Surgeon: [name], MD;  Location: [place] [facility];  Service: Cardiovascular;  Laterality: N/A;   SUBQ ICD CHANGEOUT N/A 05/15/2020   Procedure: SUBQ ICD CHANGEOUT;  Surgeon: [name], MD;  Location: [place] [facility];  Service: Cardiovascular;  Laterality: N/A;   VASECTOMY       Home Medications:  Prior to Admission medications   Medication Sig Start Date End Date Taking? Authorizing Provider  allopurinol (ZYLOPRIM) 100 MG tablet Take 1 tablet (100 mg total) by mouth daily. 01/09/21  Yes Clegg, Amy D, NP  amiodarone (PACERONE) 100 MG tablet TAKE 1 TABLET (100 MG TOTAL) BY MOUTH DAILY. Patient taking differently: Take 100 mg by mouth daily. 01/09/21 01/09/22 Yes Clegg, Amy D, NP  apixaban (ELIQUIS) 5 MG TABS tablet TAKE 1 TABLET (5 MG TOTAL) BY MOUTH 2 (TWO) TIMES DAILY. RESUME WITH EVENING DOSE ON SUNDAY Patient taking differently: Take 5 mg by mouth 2 (two) times daily. 01/09/21 01/09/22 Yes Clegg, Amy D, NP  busPIRone (BUSPAR) 10 MG tablet TAKE 1 TABLET (10 MG TOTAL) BY MOUTH 2 (TWO) TIMES DAILY. Patient taking differently: Take 10 mg by mouth 2 (two) times daily. 06/24/20 06/24/21 Yes [name], FNP  carvedilol (COREG) 6.25 MG tablet TAKE 1 TABLET (6.25 MG TOTAL) BY MOUTH 2 (TWO) TIMES DAILY. Patient taking differently: Take  6.25 mg by mouth 2 (two) times daily. 08/07/20 08/07/21 Yes Bensimhon, Shaune Pascal, MD  cetirizine (ZYRTEC) 10 MG tablet Take 1 tablet (10 mg total) by mouth daily. 06/27/20  Yes [name], FNP  empagliflozin (JARDIANCE) 10 MG TABS tablet TAKE 1 TABLET (10 MG TOTAL) BY MOUTH DAILY BEFORE BREAKFAST. Patient taking differently: Take 10 mg by mouth daily before breakfast. 12/06/20 12/06/21 Yes Milford, Maricela Bo, FNP  famotidine (PEPCID) 40 MG tablet TAKE 1 TABLET (40 MG TOTAL) BY MOUTH DAILY AS NEEDED FOR HEARTBURN OR INDIGESTION. Patient taking differently: Take 40 mg by mouth daily as needed for indigestion or heartburn. 12/25/20 12/25/21 Yes Gatha Mayer, MD  ferrous sulfate 325 (65 FE) MG tablet Take 1 tablet (325 mg total) by mouth daily with breakfast. 03/04/21  Yes Gatha Mayer, MD  fluticasone [facility]) 50 MCG/ACT nasal  spray Place 2 sprays into both nostrils daily. Patient taking differently: Place 2 sprays into both nostrils daily as needed for allergies. 09/18/19  Yes [name], FNP  gabapentin (NEURONTIN) 300 MG capsule Take 1 capsule (300 mg total) by mouth 3 (three) times daily. 06/27/20  Yes [name], FNP  glipiZIDE (GLUCOTROL) 10 MG tablet Take 1 tablet (10 mg total) by mouth 2 (two) times daily before a meal. 04/22/21  Yes King, Diona Foley, NP  insulin aspart (NOVOLOG) 100 UNIT/ML FlexPen INJECT 0-15 UNIT(S) INTO THE SKIN THREE TIMES DAILY WITH MEALS VIA SLIDING SCALE Patient taking differently: Inject 0-15 Units into the skin 3 (three) times daily with meals. 11/15/20 11/15/21 Yes [name], FNP  insulin NPH-regular Human (70-30) 100 UNIT/ML injection INJECT 38 UNITS INTO THE SKIN 2 (TWO) TIMES DAILY WITH A MEAL. Patient taking differently: Inject 38 Units into the skin 2 (two) times daily with a meal. 06/27/20 06/27/21 Yes [name], FNP  metFORMIN (GLUCOPHAGE) 1000 MG tablet Take 1 tablet (1,000 mg total) by mouth 2 (two) times daily with a meal. 01/09/21   Yes Clegg, Amy D, NP  oxyCODONE (ROXICODONE) 5 MG immediate release tablet Take 1 tablet (5 mg total) by mouth every 6 (six) hours as needed for up to 10 doses for breakthrough pain. 04/12/21  Yes Curatolo, Adam, DO  sacubitril-valsartan (ENTRESTO) 49-51 MG TAKE 1 TABLET BY MOUTH 2 (TWO) TIMES DAILY. 01/24/21  Yes Clegg, Amy D, NP  spironolactone (ALDACTONE) 25 MG tablet Take 1 tablet (25 mg total) by mouth daily. 01/09/21 08/07/21 Yes Clegg, Amy D, NP  torsemide (DEMADEX) 20 MG tablet TAKE 1 TABLET (20 MG TOTAL) BY MOUTH DAILY. Patient taking differently: Take 20 mg by mouth daily. 01/09/21 01/09/22 Yes Clegg, Amy D, NP  traZODone (DESYREL) 100 MG tablet Take 1 tablet (100 mg total) by mouth at bedtime. For sleep 03/05/21  Yes Vevelyn Francois, NP  Vitamin D, Ergocalciferol, (DRISDOL) 1.25 MG (50000 UNIT) CAPS capsule TAKE 1 CAPSULE (50,000 UNITS TOTAL) BY MOUTH ONCE A WEEK. Patient taking differently: Take 50,000 Units by mouth every Monday. 10/29/20 10/29/21 Yes [name], FNP  Accu-Chek FastClix Lancets MISC USE AS DIRECTED FOUR TIMES DAILY 01/29/21 01/29/22  Vevelyn Francois, NP  blood glucose meter kit and supplies Use up to four times daily as directed. 01/27/21   Vevelyn Francois, NP  Continuous Blood Gluc Receiver (FREESTYLE LIBRE 14 DAY READER) DEVI 1 each by Does not apply route 3 (three) times daily between meals as needed. 03/19/20   [name], FNP  Continuous Blood Gluc Sensor (GUARDIAN SENSOR 3) MISC 1 Device by Does not apply route 3 (three) times daily between meals as needed. 03/25/20   [name], FNP  Insulin Pen Needle 31G X 5 MM MISC USE AS DIRECTED 11/27/20 11/27/21  [name], FNP  Insulin Syringe-Needle U-100 31G X 5/16" 0.5 ML MISC USE TO INJECT INSULIN TWICE DAILY. 09/17/20 09/17/21  [name], FNP  Magnesium Oxide 200 MG TABS Take 100 mg by mouth daily.    [provider]  predniSONE (DELTASONE) 10 MG tablet take 2 tablets by mouth for 2 days, then  1 tab for 2 days, then stop Patient not taking: Reported on 04/29/2021 04/16/21   Fenton Foy, NP  Colchicine (MITIGARE) 0.6 MG CAPS Take 1 tablet by mouth 2 (two) times daily as needed (gout). 06/27/20 11/26/20  [name], FNP  Inpatient Medications: Scheduled Meds:  apixaban  5 mg Oral BID   busPIRone  10 mg Oral BID   insulin aspart  0-15 Units Subcutaneous TID WC   insulin aspart protamine- aspart  38 Units Subcutaneous BID WC   loratadine  10 mg Oral Daily   sodium chloride flush  3 mL Intravenous Q12H   torsemide  20 mg Oral Daily   traZODone  100 mg Oral QHS   Continuous Infusions:  sodium chloride 20 mL/hr at 04/30/21 0753   sodium chloride     amiodarone 30 mg/hr (04/30/21 0607)   PRN Meds: sodium chloride, acetaminophen, ALPRAZolam, famotidine, nitroGLYCERIN, ondansetron (ZOFRAN) IV, oxyCODONE, sodium chloride flush, zolpidem  Allergies:    Allergies  Allergen Reactions   Aspirin Shortness Of Breath, Itching and Rash     Burning sensation (Patient reports Jeffrey tolerates other NSAIDS)    Bee Venom Hives and Swelling   Lisinopril Cough   Tomato Rash    Social History:   Social History   Socioeconomic History   Marital status: Married    Spouse name: Not on file   Number of children: Not on file   Years of education: Not on file   Highest education level: Not on file  Occupational History   Not on file  Tobacco Use   Smoking status: Every Day    Packs/day: 0.25    Years: 22.00    Pack years: 5.50    Types: Cigarettes    Last attempt to quit: 04/16/2015    Years since quitting: 6.0   Smokeless tobacco: Never  Vaping Use   Vaping Use: Never used  Substance and Sexual Activity   Alcohol use: Yes    Comment: 7 drinks per week max   Drug use: Yes    Frequency: 14.0 times per week    Types: Marijuana    Comment: uses marijuana twice a day   Sexual activity: Yes    Birth control/protection: Condom  Other Topics Concern   Not on file  Social  History Narrative   Married - 33 kids 69 boys 7 girls many are adults, 2 young children with current wife   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   Maybe 1 alcoholic beverage a day no caffeine currently still smokes cigarettes denies other tobacco and smokes marijuana   Social Determinants of Radio broadcast assistant Strain: Not on file  Food Insecurity: Not on file  Transportation Needs: Not on file  Physical Activity: Not on file  Stress: Not on file  Social Connections: Not on file  Intimate Partner Violence: Not on file    Family History:   Family History  Problem Relation Age of Onset   Hypertension Mother    Diabetes Maternal Aunt    Heart attack Neg Hx    Stroke Neg Hx    Colon cancer Neg Hx    Pancreatic cancer Neg Hx    Stomach cancer Neg Hx    Liver cancer Neg Hx    Esophageal cancer Neg Hx      ROS:  Please see the history of present illness.  All other ROS reviewed and negative.     Physical Exam/Data:   Vitals:   04/30/21 0630 04/30/21 0653 04/30/21 0749 04/30/21 0750  BP:  (!) 115/93  107/84  Pulse: (!) 102 (!) 40  (!) 133  Resp: (!) 41 (!) 41  (!) 24  Temp:  99 F (37.2 C)  TempSrc:    Oral  SpO2: (!) 89% 90%  90%  Weight:   (!) 152.9 kg   Height:   '5\' 8"'  (1.727 m)    No intake or output data in the 24 hours ending 04/30/21 0914 Last 3 Weights 04/30/2021 04/12/2021 02/10/2021  Weight (lbs) 337 lb 340 lb 324 lb 15.3 oz  Weight (kg) 152.862 kg 154.223 kg 147.4 kg     Body mass index is 51.24 kg/m.  General:  Well nourished, well developed, is SOB HEENT: normal Lymph: no adenopathy Neck: no JVD Endocrine:  No thryomegaly Vascular: No carotid bruit  Cardiac:  irreg-irreg, tachycardic; no murmurs, gallops or rubs noted Lungs:  b/l crackles to mid lung b/l, no wheezing, rhonchi or rales  Abd: soft, nontender,obese Ext: trace edema Musculoskeletal:  No deformities Skin: warm and dry   Neuro:  no focal abnormalities noted Psych:  Normal affect   EKG:  The EKG was personally reviewed and demonstrates:    AFib 157bpm, PVCs vs aberrant beats 1-3beats) AFib, NS wide complex rhythm very irregular, though suspect NSVT AFib 137bpm, PVC  OLD 12/06/20: SR 88bpm  Telemetry:  Telemetry was personally reviewed and demonstrates:   AFib 130's, burden of NSVT wide complex tachy episodes that were frequent yesterday has lessened  Relevant CV Studies:  06/06/2020: TTE IMPRESSIONS   1. Left ventricular ejection fraction, by estimation, is 20 to 25%. The  left ventricle has severely decreased function. The left ventricle  demonstrates global hypokinesis. The left ventricular internal cavity size  was mildly dilated. There is mild left  ventricular hypertrophy. Left ventricular diastolic parameters are  consistent with Grade I diastolic dysfunction (impaired relaxation).   2. Right ventricular systolic function is normal. The right ventricular  size is normal.   3. Left atrial size was mildly dilated.   4. The mitral valve is normal in structure. Trivial mitral valve  regurgitation. No evidence of mitral stenosis.   5. The aortic valve is normal in structure. Aortic valve regurgitation is  not visualized. No aortic stenosis is present.   6. The inferior vena cava is normal in size with greater than 50%  respiratory variability, suggesting right atrial pressure of 3 mmHg.    05/17/2015: R/LHC Findings:   RA = 7 RV = 42/3/7 PA =  43/30 (39) PCW = 26 Fick cardiac output/index = 4.9/2.1 PVR = 2.7 WU SVR = 1651 FA sat = 95% PA sat = 65%, 65% Ao = 122/97 (109) LV = 110/31/33   Assessment: 1) Normal coronaries 2) Moderately elevated filling pressures with moderately depressed cardiac output    Laboratory Data:  High Sensitivity Troponin:   Recent Labs  Lab 04/29/21 1247 04/29/21 1659  TROPONINIHS 74* 64*     Chemistry Recent Labs  Lab 04/29/21 1247  04/30/21 0555  NA 137 134*  K 3.6 4.4  CL 106 105  CO2 20* 16*  GLUCOSE 253* 322*  BUN 43* 44*  CREATININE 1.99* 2.20*  CALCIUM 9.3 9.3  GFRNONAA 41* 36*  ANIONGAP 11 13    Recent Labs  Lab 04/30/21 0555  PROT 6.8  ALBUMIN 3.2*  AST 43*  ALT 135*  ALKPHOS 55  BILITOT 0.7   Hematology Recent Labs  Lab 04/29/21 1247  WBC 9.6  RBC 5.43  HGB 15.0  HCT 45.3  MCV 83.4  MCH 27.6  MCHC 33.1  RDW 18.1*  PLT 221   BNP Recent Labs  Lab 04/29/21 1247  BNP 1,407.7*    DDimer No results for input(s): DDIMER in the last 168 hours.   Radiology/Studies:  DG Chest 2 View  Result Date: 04/29/2021 CLINICAL DATA:  Chest pain EXAM: CHEST - 2 VIEW COMPARISON:  11/21/2020 chest radiograph. FINDINGS: Stable configuration of single lead left chest ICD with lead tip overlying the upper left heart. Stable cardiomediastinal silhouette with mild cardiomegaly. No pneumothorax. No pleural effusion. Borderline mild pulmonary edema. No consolidative airspace disease. IMPRESSION: Borderline mild congestive heart failure. Electronically Signed   By: Ilona Sorrel M.D.   On: 04/29/2021 13:07     Assessment and Plan:   Paroxysmal Afib RVR CHA2DS2Vasc is 3, on eliquis, appropriately dosed Chronically on amiodarone for his AF and VT hx Jeffrey has missed a couple Eliquis doses of late Planned to TEE/DCCV this afternoon Would keep amio gtt post DCCV and perhaps tomorrow or once volume status is better, transition to PO   2. Acute/chronic CHF No I/O charted, pt reports no real up tick in urine OP with lasix last night Probably 2/2 RVR Jeffrey [name] defer management to the AHF team pending more IV lasix ordered  Dr. Curt Bears Neveah Bang see the patient later this morning/today   Risk Assessment/Risk Scores:    For questions or updates, please contact [facility] Please consult [email or web address] for contact info under    Signed, Baldwin Jamaica, PA-C  04/30/2021 9:14 AM  I have seen and examined this  patient with Jeffrey [name].  Agree with above, note added to reflect my findings.  On exam, tachycardic, irregular.  Patient presented to the hospital with rapid atrial fibrillation and a CHF exacerbation.  Jeffrey had a TEE and cardioversion and is now back in sinus rhythm.  Jeffrey is on amiodarone at home.  Would continue IV amiodarone until his volume status has been optimized.  Jeffrey [name] likely need an outpatient load of amiodarone once Jeffrey is off the IV.  Jeffrey [name] M. Yoshio Seliga MD 04/30/2021 2:00 PM

## 2021-04-30 NOTE — Procedures (Addendum)
Electrical Cardioversion Procedure Note KARLO STOCKDALE [id number] May 08, 1976  Procedure: Electrical Cardioversion Indications:  Atrial Fibrillation  Procedure Details Consent: Risks of procedure as well as the alternatives and risks of each were explained to the (patient/caregiver).  Consent for procedure obtained. Time Out: Verified patient identification, verified procedure, site/side was marked, verified correct patient position, special equipment/implants available, medications/allergies/relevent history reviewed, required imaging and test results available.  Performed  Patient placed on cardiac monitor, pulse oximetry, supplemental oxygen as necessary.  Sedation given:  Ketamine per anesthesiology Pacer pads placed anterior and posterior chest.  Cardioverted 1 time(s).  Cardioverted at [place].  Evaluation Findings: Post procedure EKG shows: NSR Complications: None Patient did tolerate procedure well.   Loralie Champagne 04/30/2021, 1:05 PM

## 2021-04-30 NOTE — ED Notes (Signed)
HF PA at bedside with patient. Received call from endo that patient will be on schedule for noon today.

## 2021-04-30 NOTE — ED Notes (Signed)
Patient transported to endo.  

## 2021-04-30 NOTE — Consult Note (Addendum)
Advanced Heart Failure Team Consult Note   Primary Physician: [name], FNP (Inactive) PCP-Cardiologist:  Glori Bickers, MD  Reason for Consultation: acute on chronic systolic heart failure   HPI:    Jeffrey [name] is seen today for evaluation of acute on chronic systolic heart failure,  at the request of Dr. Radford Pax, Cardiology.   Jeffrey [name] is a 45 y.o. male with a history of  poorly controlled HTN, R renal cell carcinoma s/p nephrectomy, CKD IIIa, DM2, OSA, gout, morbid obesity and chronic systolic HF due to NICM.   Echo in 2012 with EF 20-25%.  In 1/13 had nuclear study EF 45-50% suggestive of NICM.  Admitted 3/16 secondary to atypical chest pain, dyspnea, and mild troponin elevation. Echo with "severely reduced LV function" - EF not quantified due to poor windows. Cath 8/16 showed normal coronaries and moderately elevated filling pressures with moderately depressed cardiac output. Watts s- ICD implanted.   Admitted 9/18 with ADHF in setting of new afib. Started on amiodarone. Pt diuresed and converted to NSR without requiring DCCV. Started on Eliquis for [facility].   Admitted 3/19 with ICD shock. Electrolytes stable. Echo 12/31/17 with LVEF 25-30%.   Pt admitted 05/15/20 for S-ICD generator change. Course was complicated with acute hypoxic/hypercapnic respiratory failure due to possible flash pulmonary edema. Had traumatic intubation resulting in aspiration and requiring ventilator support and short term pressor support.   Hospitalized 2/17-2/22/22 for hypotension  & dizziness. Found to have AKI, likely 2/2 to recent diarrhea, diuretic use and poor po intake. Gently hydrated with IVF, Entresto and torsemide decreased, and BiDil stopped. Discharge weight 323 lbs.  Presented to the ED yesterday w/ complaint of increased exertional and resting dyspnea over the last 3 days. Also w/ orthopnea/PND, despite use of CPAP.   In the ED, he was found to be in rapid atrial  fibrillation, HR 150s + a/c CHF. CXR w/ findings c/w CHF. BNP 1400. Also w/ AKI w/ SCr elevated at 1.99, baseline ~1.5. CO2 also low at 20. Na 134. K 4.4. LFTs elevated: AST 43, ALT 135. Reports sick contacts: his kids tested + for RSV last week. He was COVID negative in ED.   He was given 40 IV Lasix x 1 in ED last night w/ poor urinary response. SCr higher, 2.20 today. CO2 20>>16. He remains SOB, O2 sats 90% on 3L. Remains in Afib 150s. SBPs 110s. Still in ED awaking bed placement.    Echo 06/2020 1. Left ventricular ejection fraction, by estimation, is 20 to 25%. The left ventricle has severely decreased function. The left ventricle demonstrates global hypokinesis. The left ventricular internal cavity size was mildly dilated. There is mild left ventricular hypertrophy. Left ventricular diastolic parameters are consistent with Grade I diastolic dysfunction (impaired relaxation). 2. Right ventricular systolic function is normal. The right ventricular size is normal. 3. Left atrial size was mildly dilated. 4. The mitral valve is normal in structure. Trivial mitral valve regurgitation. No evidence of mitral stenosis. 5. The aortic valve is normal in structure. Aortic valve regurgitation is not visualized. No aortic stenosis is present. 6. The inferior vena cava is normal in size with greater than 50% respiratory variability, suggesting right atrial pressure of 3 mmHg.  Review of Systems: [y] = yes, _0  = no   General: Weight gain _1 ; Weight loss _2 ; Anorexia _3 ; Fatigue [ Y]; Fever _4 ; Chills _5 ; Weakness _6   Cardiac: Chest pain/pressure _7 ;  Resting SOB [ Y]; Exertional SOB [ Y]; Orthopnea [Y ]; Pedal Edema _0 ; Palpitations [ Y]; Syncope _1 ; Presyncope _2 ; Paroxysmal nocturnal dyspnea[ Y]  Pulmonary: Cough [ Y]; Wheezing[ Y]; Hemoptysis_3 ; Sputum _4 ; Snoring _5   GI: Vomiting_6 ; Dysphagia_7 ; Melena_8 ; Hematochezia _9 ; Heartburn_10 ; Abdominal pain _11 ; Constipation _12 ; Diarrhea  _13 ; BRBPR _14   GU: Hematuria_15 ; Dysuria _16 ; Nocturia_17   Vascular: Pain in legs with walking _18 ; Pain in feet with lying flat _19 ; Non-healing sores _20 ; Stroke _21 ; TIA _22 ; Slurred speech _23 ;  Neuro: Headaches_24 ; Vertigo_25 ; Seizures_26 ; Paresthesias_27 ;Blurred vision _28 ; Diplopia _29 ; Vision changes _30   Ortho/Skin: Arthritis _31 ; Joint pain _32 ; Muscle pain _33 ; Joint swelling _34 ; Back Pain _35 ; Rash _36   Psych: Depression_37 ; Anxiety_38   Heme: Bleeding problems _39 ; Clotting disorders _40 ; Anemia _41   Endocrine: Diabetes _42 ; Thyroid dysfunction_43   Home Medications Prior to Admission medications   Medication Sig Start Date End Date Taking? Authorizing Provider  allopurinol (ZYLOPRIM) 100 MG tablet Take 1 tablet (100 mg total) by mouth daily. 01/09/21  Yes Clegg, Amy D, NP  amiodarone (PACERONE) 100 MG tablet TAKE 1 TABLET (100 MG TOTAL) BY MOUTH DAILY. Patient taking differently: Take 100 mg by mouth daily. 01/09/21 01/09/22 Yes Clegg, Amy D, NP  apixaban (ELIQUIS) 5 MG TABS tablet TAKE 1 TABLET (5 MG TOTAL) BY MOUTH 2 (TWO) TIMES DAILY. RESUME WITH EVENING DOSE ON SUNDAY Patient taking differently: Take 5 mg by mouth 2 (two) times daily. 01/09/21 01/09/22 Yes Clegg, Amy D, NP  busPIRone (BUSPAR) 10 MG tablet TAKE 1 TABLET (10 MG TOTAL) BY MOUTH 2 (TWO) TIMES DAILY. Patient taking differently: Take 10 mg by mouth 2 (two) times daily. 06/24/20 06/24/21 Yes [name], FNP  carvedilol (COREG) 6.25 MG tablet TAKE 1 TABLET (6.25 MG TOTAL) BY MOUTH 2 (TWO) TIMES DAILY. Patient taking differently: Take 6.25 mg by mouth 2 (two) times daily. 08/07/20 08/07/21 Yes Bensimhon, Shaune Pascal, MD  cetirizine (ZYRTEC) 10 MG tablet Take 1 tablet (10 mg total) by mouth daily. 06/27/20  Yes [name], FNP  empagliflozin (JARDIANCE) 10 MG TABS tablet TAKE 1 TABLET (10 MG TOTAL) BY MOUTH DAILY BEFORE BREAKFAST. Patient taking differently: Take 10 mg by mouth daily before breakfast. 12/06/20 12/06/21 Yes  Milford, Maricela Bo, FNP  famotidine (PEPCID) 40 MG tablet TAKE 1 TABLET (40 MG TOTAL) BY MOUTH DAILY AS NEEDED FOR HEARTBURN OR INDIGESTION. Patient taking differently: Take 40 mg by mouth daily as needed for indigestion or heartburn. 12/25/20 12/25/21 Yes Gatha Mayer, MD  ferrous sulfate 325 (65 FE) MG tablet Take 1 tablet (325 mg total) by mouth daily with breakfast. 03/04/21  Yes Gatha Mayer, MD  fluticasone [facility]) 50 MCG/ACT nasal spray Place 2 sprays into both nostrils daily. Patient taking differently: Place 2 sprays into both nostrils daily as needed for allergies. 09/18/19  Yes [name], FNP  gabapentin (NEURONTIN) 300 MG capsule Take 1 capsule (300 mg total) by mouth 3 (three) times daily. 06/27/20  Yes [name], FNP  glipiZIDE (GLUCOTROL) 10 MG tablet Take 1 tablet (10 mg total) by mouth 2 (two) times daily before a meal. 04/22/21  Yes King, Diona Foley, NP  insulin aspart (NOVOLOG) 100 UNIT/ML FlexPen INJECT 0-15 UNIT(S) INTO THE SKIN THREE  TIMES DAILY WITH MEALS VIA SLIDING SCALE Patient taking differently: Inject 0-15 Units into the skin 3 (three) times daily with meals. 11/15/20 11/15/21 Yes [name], FNP  insulin NPH-regular Human (70-30) 100 UNIT/ML injection INJECT 38 UNITS INTO THE SKIN 2 (TWO) TIMES DAILY WITH A MEAL. Patient taking differently: Inject 38 Units into the skin 2 (two) times daily with a meal. 06/27/20 06/27/21 Yes [name], FNP  metFORMIN (GLUCOPHAGE) 1000 MG tablet Take 1 tablet (1,000 mg total) by mouth 2 (two) times daily with a meal. 01/09/21  Yes Clegg, Amy D, NP  oxyCODONE (ROXICODONE) 5 MG immediate release tablet Take 1 tablet (5 mg total) by mouth every 6 (six) hours as needed for up to 10 doses for breakthrough pain. 04/12/21  Yes Curatolo, Adam, DO  sacubitril-valsartan (ENTRESTO) 49-51 MG TAKE 1 TABLET BY MOUTH 2 (TWO) TIMES DAILY. 01/24/21  Yes Clegg, Amy D, NP  spironolactone (ALDACTONE) 25 MG tablet Take 1 tablet (25 mg  total) by mouth daily. 01/09/21 08/07/21 Yes Clegg, Amy D, NP  torsemide (DEMADEX) 20 MG tablet TAKE 1 TABLET (20 MG TOTAL) BY MOUTH DAILY. Patient taking differently: Take 20 mg by mouth daily. 01/09/21 01/09/22 Yes Clegg, Amy D, NP  traZODone (DESYREL) 100 MG tablet Take 1 tablet (100 mg total) by mouth at bedtime. For sleep 03/05/21  Yes Vevelyn Francois, NP  Vitamin D, Ergocalciferol, (DRISDOL) 1.25 MG (50000 UNIT) CAPS capsule TAKE 1 CAPSULE (50,000 UNITS TOTAL) BY MOUTH ONCE A WEEK. Patient taking differently: Take 50,000 Units by mouth every Monday. 10/29/20 10/29/21 Yes [name], FNP  Accu-Chek FastClix Lancets MISC USE AS DIRECTED FOUR TIMES DAILY 01/29/21 01/29/22  Vevelyn Francois, NP  blood glucose meter kit and supplies Use up to four times daily as directed. 01/27/21   Vevelyn Francois, NP  Continuous Blood Gluc Receiver (FREESTYLE LIBRE 14 DAY READER) DEVI 1 each by Does not apply route 3 (three) times daily between meals as needed. 03/19/20   [name], FNP  Continuous Blood Gluc Sensor (GUARDIAN SENSOR 3) MISC 1 Device by Does not apply route 3 (three) times daily between meals as needed. 03/25/20   [name], FNP  Insulin Pen Needle 31G X 5 MM MISC USE AS DIRECTED 11/27/20 11/27/21  [name], FNP  Insulin Syringe-Needle U-100 31G X 5/16" 0.5 ML MISC USE TO INJECT INSULIN TWICE DAILY. 09/17/20 09/17/21  [name], FNP  Magnesium Oxide 200 MG TABS Take 100 mg by mouth daily.    [provider]  predniSONE (DELTASONE) 10 MG tablet take 2 tablets by mouth for 2 days, then 1 tab for 2 days, then stop Patient not taking: Reported on 04/29/2021 04/16/21   Fenton Foy, NP  Colchicine (MITIGARE) 0.6 MG CAPS Take 1 tablet by mouth 2 (two) times daily as needed (gout). 06/27/20 11/26/20  [name], FNP    Past Medical History: Past Medical History:  Diagnosis Date   Anxiety    Aspirin allergy    Childhood asthma    Chronic systolic CHF (congestive  heart failure) ([place])    a. EF 20-25% in 2012. b. EF 45-50% in 10/2011 with nonischemic nuc - presumed NICM. c. 12/2014 Echo: Sev depressed LV fxn, sev dil LV, mild LVH, mild MR, sev dil LA, mildly reduced RV fxn.   CKD (chronic kidney disease) stage 2, GFR 60-89 ml/min    H/O vasectomy 12/2019   High cholesterol  Hypertension    Morbid obesity ([place])    Nephrolithiasis    OSA on CPAP    Paroxysmal atrial fibrillation ([place])    Presumed NICM    a. 04/2014 Myoview: EF 26%, glob HK, sev glob HK, ? prior infarct;  b. Never cathed 2/2 CKD.   Renal cell carcinoma ([place])    a. s/p Rt robotic assisted partial converted to radical nephrectomy on 01/2013.   Troponin level elevated    a. 04/2014, 12/2014: felt due to CHF.   Type II diabetes mellitus ([place])    Ventricular tachycardia ([place])    a. appropriate ICD therapy 12/2017    Past Surgical History: Past Surgical History:  Procedure Laterality Date   APPENDECTOMY  07/2004   BIOPSY  02/10/2021   Procedure: BIOPSY;  Surgeon: Gatha Mayer, MD;  Location: [facility];  Service: Endoscopy;;   CARDIAC CATHETERIZATION N/A 05/17/2015   Procedure: Right/Left Heart Cath and Coronary Angiography;  Surgeon: Jolaine Artist, MD;  Location: [place] [facility];  Service: Cardiovascular;  Laterality: N/A;   COLONOSCOPY WITH PROPOFOL N/A 02/10/2021   Procedure: COLONOSCOPY WITH PROPOFOL;  Surgeon: Gatha Mayer, MD;  Location: [facility];  Service: Endoscopy;  Laterality: N/A;   EP IMPLANTABLE DEVICE N/A 05/17/2015   Procedure: SubQ ICD Implant;  Surgeon: Will Meredith Leeds, MD;  Location: [place] CV LAB;  Service: Cardiovascular;  Laterality: N/A;   ESOPHAGOGASTRODUODENOSCOPY (EGD) WITH PROPOFOL N/A 02/10/2021   Procedure: ESOPHAGOGASTRODUODENOSCOPY (EGD) WITH PROPOFOL;  Surgeon: Gatha Mayer, MD;  Location: [facility];  Service: Endoscopy;  Laterality: N/A;   ROBOTIC ASSISTED LAPAROSCOPIC LYSIS OF ADHESION  01/13/2013   Procedure: ROBOTIC ASSISTED  LAPAROSCOPIC LYSIS OF ADHESION EXTENSIVE;  Surgeon: Alexis Frock, MD;  Location: [facility];  Service: Urology;;   ROBOTIC ASSITED PARTIAL NEPHRECTOMY Right 01/13/2013   Procedure: ROBOTIC ASSITED PARTIAL NEPHRECTOMY CONVERTED TO ROBOTIC ASSISTED RIGHT RADICAL NEPHRECTOMY;  Surgeon: Alexis Frock, MD;  Location: [facility];  Service: Urology;  Laterality: Right;   SUBQ ICD CHANGEOUT N/A 05/16/2020   Procedure: SUBQ ICD CHANGEOUT;  Surgeon: [name], MD;  Location: [place] CV LAB;  Service: Cardiovascular;  Laterality: N/A;   SUBQ ICD CHANGEOUT N/A 05/15/2020   Procedure: SUBQ ICD CHANGEOUT;  Surgeon: [name], MD;  Location: [place] CV [facility];  Service: Cardiovascular;  Laterality: N/A;   VASECTOMY      Family History: Family History  Problem Relation Age of Onset   Hypertension Mother    Diabetes Maternal Aunt    Heart attack Neg Hx    Stroke Neg Hx    Colon cancer Neg Hx    Pancreatic cancer Neg Hx    Stomach cancer Neg Hx    Liver cancer Neg Hx    Esophageal cancer Neg Hx     Social History: Social History   Socioeconomic History   Marital status: Married    Spouse name: Not on file   Number of children: Not on file   Years of education: Not on file   Highest education level: Not on file  Occupational History   Not on file  Tobacco Use   Smoking status: Every Day    Packs/day: 0.25    Years: 22.00    Pack years: 5.50    Types: Cigarettes    Last attempt to quit: 04/16/2015    Years since quitting: 6.0   Smokeless tobacco: Never  Vaping Use   Vaping Use: Never used  Substance and Sexual  Activity   Alcohol use: Yes    Comment: 7 drinks per week max   Drug use: Yes    Frequency: 14.0 times per week    Types: Marijuana    Comment: uses marijuana twice a day   Sexual activity: Yes    Birth control/protection: Condom  Other Topics Concern   Not on file  Social History Narrative   Married - 3 kids 7 boys 7 girls many are adults, 2 young children  with current wife   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   Maybe 1 alcoholic beverage a day no caffeine currently still smokes cigarettes denies other tobacco and smokes marijuana   Social Determinants of Radio broadcast assistant Strain: Not on file  Food Insecurity: Not on file  Transportation Needs: Not on file  Physical Activity: Not on file  Stress: Not on file  Social Connections: Not on file    Allergies:  Allergies  Allergen Reactions   Aspirin Shortness Of Breath, Itching and Rash     Burning sensation (Patient reports he tolerates other NSAIDS)    Bee Venom Hives and Swelling   Lisinopril Cough   Tomato Rash    Objective:    Vital Signs:   Temp:  [98.6 F (37 C)-99 F (37.2 C)] 99 F (37.2 C) (07/27 0750) Pulse Rate:  [28-136] 136 (07/27 0830) Resp:  [11-41] 18 (07/27 0830) BP: (92-147)/(72-131) 105/89 (07/27 0830) SpO2:  [89 %-98 %] 90 % (07/27 0830) Weight:  [152.9 kg] 152.9 kg (07/27 0749)    Weight change: Filed Weights   04/30/21 0749  Weight: (!) 152.9 kg    Intake/Output:  No intake or output data in the 24 hours ending 04/30/21 0954    Physical Exam    General:  fatigued appearing. SOB w/ conversation  HEENT: normal Neck: supple. Thick neck JVD not well visualized Carotids 2+ bilat; no bruits. No lymphadenopathy or thyromegaly appreciated. Cor: PMI nondisplaced. Irregularly irregular rhythm, tachy rate. No rubs, gallops or murmurs. Lungs: decreased BS at the bases + mild diffuse expiratory wheezing  Abdomen: obese, soft, nontender, nondistended. No hepatosplenomegaly. No bruits or masses. Good bowel sounds. Extremities: no cyanosis, clubbing, rash, no LE edema Neuro: alert & orientedx3, cranial nerves grossly intact. moves all 4 extremities w/o difficulty. Affect pleasant   Telemetry   Afib 140s-150s   EKG    Afib 137 bpm   Labs   Basic Metabolic Panel: Recent Labs  Lab  04/29/21 1247 04/30/21 0555  NA 137 134*  K 3.6 4.4  CL 106 105  CO2 20* 16*  GLUCOSE 253* 322*  BUN 43* 44*  CREATININE 1.99* 2.20*  CALCIUM 9.3 9.3  MG  --  2.1    Liver Function Tests: Recent Labs  Lab 04/30/21 0555  AST 43*  ALT 135*  ALKPHOS 55  BILITOT 0.7  PROT 6.8  ALBUMIN 3.2*   No results for input(s): LIPASE, AMYLASE in the last 168 hours. No results for input(s): AMMONIA in the last 168 hours.  CBC: Recent Labs  Lab 04/29/21 1247  WBC 9.6  HGB 15.0  HCT 45.3  MCV 83.4  PLT 221    Cardiac Enzymes: No results for input(s): CKTOTAL, CKMB, CKMBINDEX, TROPONINI in the last 168 hours.  BNP: BNP (last 3 results) Recent Labs    11/21/20 2222 04/29/21 1247  BNP 65.0 1,407.7*    ProBNP (last 3 results) No results  for input(s): PROBNP in the last 8760 hours.   CBG: Recent Labs  Lab 04/30/21 0737  GLUCAP 310*    Coagulation Studies: No results for input(s): LABPROT, INR in the last 72 hours.   Imaging   DG Chest 2 View  Result Date: 04/29/2021 CLINICAL DATA:  Chest pain EXAM: CHEST - 2 VIEW COMPARISON:  11/21/2020 chest radiograph. FINDINGS: Stable configuration of single lead left chest ICD with lead tip overlying the upper left heart. Stable cardiomediastinal silhouette with mild cardiomegaly. No pneumothorax. No pleural effusion. Borderline mild pulmonary edema. No consolidative airspace disease. IMPRESSION: Borderline mild congestive heart failure. Electronically Signed   By: Ilona Sorrel M.D.   On: 04/29/2021 13:07     Medications:     Current Medications:  apixaban  5 mg Oral BID   busPIRone  10 mg Oral BID   furosemide  80 mg Intravenous BID   insulin aspart  0-15 Units Subcutaneous TID WC   insulin aspart protamine- aspart  38 Units Subcutaneous BID WC   loratadine  10 mg Oral Daily   sodium chloride flush  3 mL Intravenous Q12H   torsemide  20 mg Oral Daily   traZODone  100 mg Oral QHS    Infusions:  sodium chloride 20  mL/hr at 04/30/21 0753   sodium chloride     amiodarone 30 mg/hr (04/30/21 0607)     Assessment/Plan   Acute on Chronic Systolic Heart Failure w/ Low Output  - 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 - Echo 12/31/17 LVEF 25-30%, Mod MR. RVSP 67 - Echo 9/21: EF 20-25% - Admitted now w/ a/c CHF in setting of rapid Afib, HR 150s - labs c/w low output w/ AKI + elevated LFTs and Low C02 - check stat lactic acid - will need central access, suspect he will need inotropic support w/ milrinone  - Follow Co-ox and CVPs  - Start IV Lasix 80 mg bid  - c/w amio gtt for rate control and plan TEE/DCCV once more stable  - hold ? blocker w/ suspected shock  - Hold Entresto/spiro w/ AKI  - Update echo once rate better controlled  - Has ICD   2. Afib w/ RVR - known h/o PAF, on Eliquis PTA - HR 150s - ? If triggered by viral URI (kids w/ RSV). Will test for RSV. Covid negative  - increase amio gtt to 60/hr - c/w eliquis. Has missed more than 1 dose in last 30 days - will need TEE/DCCV - keep K > 4.0 and Mg > 2.0   3. AKI on Stage III CKD - solitary kidney (renal cell carcimona 2014) - baseline SCr ~1.5 - 1.9 on admit, up to 2.20 today  - suspect cardiorenal from low output - c/w diuresis + inotropic support per above  4. Elevated LFTs - AST 43 - ALT 135 - suspect low output/ hepatic congestion  - see plan above - follow LFTs   5. H/o VT - has ICD  6. Hypertension  - currently controlled - hold Entresto/spiro w/ AKI   7. T2DM - SSI   8. OSA: - CPAP QHS   Length of Stay: 0  Brittainy Simmons, PA-C  04/30/2021, 9:54 AM  Advanced Heart Failure Team Pager [phone] (M-F; 7a - 5p)  Please contact [facility] for night-coverage after hours (4p -7a ) and weekends on [email or web address]  Patient seen with PA, agree with the above note.   Patient was admitted with 2-3 days of worsening dyspnea and orthopnea, found  to be in atrial fibrillation with RVR rate 150s.  He had AKI with  creatinine up to 2.2.  He was started on amiodarone gtt but HR did not respond appreciably.  He required supplemental oxygen.  Lactate elevated.   Due to difficulty controlling rate, decision was made to take patient for DCCV today.   Very limited TEE was done due to respiratory distress (has missed Eliquis doses), no LA or appendage thrombus present.  He was then cardioverted back to NSR.    General: NAD Neck: Thick JVP 12 cm, no thyromegaly or thyroid nodule.  Lungs: Crackles at bases.  CV: Nondisplaced PMI.  Heart tachy, irregular S1/S2, no S3/S4, no murmur.  1+ ankle edema.  No carotid bruit.  Normal pedal pulses.  Abdomen: Soft, nontender, no hepatosplenomegaly, no distention.  Skin: Intact without lesions or rashes.  Neurologic: Alert and oriented x 3.  Psych: Normal affect. Extremities: No clubbing or cyanosis.  HEENT: Normal.   1. Atrial fibrillation: Paroxysmal.   Suspect AF with RVR drove CHF exacerbation.  Patient has been on amiodarone and Eliquis at home, has missed some Eliquis doses. TEE-guided DCCV today, now back in NSR.  - Continue amiodarone gtt.  - Continue apixaban.  - Eventually needs consideration for AF ablation.  2. Acute on chronic systolic CHF: Nonischemic CMP, exacerbation likely driven by AF.  Echo in 9/21 with EF 20-25%.  He is volume overloaded on exam.  Lactate elevated in setting of AF with RVR. Hopefully, he will improve with resumption of NSR.  - Lasix 80 mg IV bid.  - Will hold Jardiance, spironolactone, Entresto for now with AKI.  - Place PICC to follow CVP and co-ox.    - I will arrange for repeat echo.  3. AKI on CKD stage 3: Has solitary kidney.  Suspect cardiorenal syndrome/low output with AF/RVR in setting of cardiomyopathy.  Hopefully will improve with resumption of NSR.  4. OSA: CPAP at night.   Loralie Champagne 04/30/2021 1:16 PM

## 2021-04-30 NOTE — Transfer of Care (Signed)
Immediate Anesthesia Transfer of Care Note  Patient: [name]  Procedure(s) Performed: TRANSESOPHAGEAL ECHOCARDIOGRAM (TEE) CARDIOVERSION  Patient Location: Endoscopy Unit  Anesthesia Type:General  Level of Consciousness: drowsy  Airway & Oxygen Therapy: Patient Spontanous Breathing and Patient connected to face mask oxygen  Post-op Assessment: Report given to RN and Post -op Vital signs reviewed and stable  Post vital signs: Reviewed and stable  Last Vitals:  Vitals Value Taken Time  BP    Temp    Pulse 104 04/30/21 1313  Resp 34 04/30/21 1313  SpO2 88 % 04/30/21 1313  Vitals shown include unvalidated device data.  Last Pain:  Vitals:   04/30/21 1146  TempSrc: Temporal  PainSc: 0-No pain         Complications: No notable events documented.

## 2021-04-30 NOTE — Progress Notes (Addendum)
eLink Physician-Brief Progress Note Patient Name: DEANTHONY DIZE DOB: [birth year] MRN: [id number]   Date of Service  04/30/2021  HPI/Events of Note  Multiple issues: 1. Hypotension - BP = 71/58 with MAP = 64 and 2. ABG on 100%/PRVC 30/TV 550/P 14 = 7.203/47.3/82/18.6. Very little room to increase PRVC rate. CVP = 12 and Hgb = 17.0.  eICU Interventions  Plan: Phenylephrine IV infusion. Titrate to MAP >= 65. NaHCO3 100 meq IV X 1 now. NaHCO3 IV infusion to run at 50 mL/hour. Repeat ABG at 1 AM.     Intervention Category Major Interventions: Acid-Base disturbance - evaluation and management;Respiratory failure - evaluation and management;Hypotension - evaluation and management  Lysle Dingwall 04/30/2021, 8:42 PM

## 2021-04-30 NOTE — Procedures (Signed)
Intubation Procedure Note  Jeffrey [name]  [id number]  Feb 18, 1976  Date:04/30/21  Time:5:00 PM   Provider Performing:Roderick Calo C Tamala Julian    Procedure: Intubation (31500)  Indication(s) Respiratory Failure  Consent Risks of the procedure as well as the alternatives and risks of each were explained to the patient and/or caregiver.  Consent for the procedure was obtained and is signed in the bedside chart   Anesthesia Etomidate, Versed, Fentanyl, and Rocuronium   Time Out Verified patient identification, verified procedure, site/side was marked, verified correct patient position, special equipment/implants available, medications/allergies/relevant history reviewed, required imaging and test results available.   Sterile Technique Usual hand hygeine, masks, and gloves were used   Procedure Description Patient positioned in bed supine.  Sedation given as noted above.  Patient was intubated with endotracheal tube using Glidescope.  View was Grade 1 full glottis .  Number of attempts was 1.  Colorimetric CO2 detector was consistent with tracheal placement.   Complications/Tolerance None; patient tolerated the procedure well. Chest X-ray is ordered to verify placement.   EBL Minimal   Specimen(s) None

## 2021-04-30 NOTE — ED Notes (Signed)
RN paged admitting regarding patient needing higher acuity bed and orders for NPO due to possible planned procedure.

## 2021-04-30 NOTE — Anesthesia Postprocedure Evaluation (Signed)
Anesthesia Post Note  Patient: Jeffrey [name]  Procedure(s) Performed: TRANSESOPHAGEAL ECHOCARDIOGRAM (TEE) CARDIOVERSION     Patient location during evaluation: PACU Anesthesia Type: MAC Level of consciousness: awake and alert Pain management: pain level controlled Vital Signs Assessment: post-procedure vital signs reviewed and stable Respiratory status: spontaneous breathing and respiratory function stable Cardiovascular status: stable Postop Assessment: no apparent nausea or vomiting Anesthetic complications: no   No notable events documented.  Last Vitals:  Vitals:   04/30/21 1344 04/30/21 1354  BP: 114/88 (!) 129/102  Pulse: 99 98  Resp: (!) 31 (!) 37  Temp:    SpO2: (!) 87% 91%    Last Pain:  Vitals:   04/30/21 1344  TempSrc:   PainSc: 0-No pain                 [name]

## 2021-04-30 NOTE — ED Notes (Signed)
Patient reports shortness of breath increasing this am. Turner MD notified and respiratory called to bedside to assess patient further.

## 2021-04-30 NOTE — Procedures (Signed)
Central Venous Catheter Insertion Procedure Note  Jeffrey [name]  [id number]  [birth year]  Date:04/30/21  Time:5:01 PM   Provider Performing:Janita Camberos Cipriano Mile   Procedure: Insertion of Non-tunneled Central Venous [phone]) with US guidance [id number])   Indication(s) Difficult access  Consent Risks of the procedure as well as the alternatives and risks of each were explained to the patient and/or caregiver.  Consent for the procedure was obtained and is signed in the bedside chart  Anesthesia Topical only with 1% lidocaine   Timeout Verified patient identification, verified procedure, site/side was marked, verified correct patient position, special equipment/implants available, medications/allergies/relevant history reviewed, required imaging and test results available.  Sterile Technique Maximal sterile technique including full sterile barrier drape, hand hygiene, sterile gown, sterile gloves, mask, hair covering, sterile ultrasound probe cover (if used).  Procedure Description Area of catheter insertion was cleaned with chlorhexidine and draped in sterile fashion.  With real-time ultrasound guidance a central venous catheter was placed into the left internal jugular vein. Nonpulsatile blood flow and easy flushing noted in all ports.  The catheter was sutured in place and sterile dressing applied.  Complications/Tolerance None; patient tolerated the procedure well. Chest X-ray is ordered to verify placement for internal jugular or subclavian cannulation.   Chest x-ray is not ordered for femoral cannulation.  EBL Minimal  Specimen(s) None

## 2021-04-30 NOTE — Progress Notes (Signed)
eLink Physician-Brief Progress Note Patient Name: Jeffrey [name] DOB: November 13, 1975 MRN: [id number]   Date of Service  04/30/2021  HPI/Events of Note  Oliguria - Bladder scan with 57 mL residual. Patient is already on a Lasix IV infusion at 12 mg/hour. CVP = 15.  eICU Interventions  Continue present management.      Intervention Category Major Interventions: Other:  Lysle Dingwall 04/30/2021, 11:41 PM

## 2021-04-30 NOTE — Consult Note (Signed)
NAME:  Jeffrey [name], MRN:  [id number], DOB:  06-24-1976, LOS: 0 ADMISSION DATE:  04/29/2021, CONSULTATION DATE:  04/30/21 REFERRING MD:  CHF, CHIEF COMPLAINT:  SOB   History of Present Illness:  45 year old man with extensive cardiac hx as below presenting with decompensated heart failure in setting of afib.  Cardioverted earlier today but unforuntately worse respiratory distress.  + orthopnea.  Saturating 80s on NRB and RR 40s, Aox3, anxious.  Pertinent  Medical History  NICM with AICD in place HTN R renal cell carcinoma s/p nephrectomy CKD 3a Hx of traumatic intubation  Significant Hospital Events: Including procedures, antibiotic start and stop dates in addition to other pertinent events   7/26 admitted 7/27 cardioversion, worsening resp distress  Interim History / Subjective:  Seen, limited subjective since he desats while talking, ROS as below.  Objective   Blood pressure (!) 129/102, pulse 98, temperature 98.9 F (37.2 C), temperature source Temporal, resp. rate (!) 37, height _0  (1.727 m), weight (!) 152.9 kg, SpO2 91 %.        Intake/Output Summary (Last 24 hours) at 04/30/2021 1527 Last data filed at 04/30/2021 1045 Gross per 24 hour  Intake --  Output 600 ml  Net -600 ml   Filed Weights   04/30/21 0749 04/30/21 1146  Weight: (!) 152.9 kg (!) 152.9 kg    Examination: General: mild distress on NRB HENT: MMM, malampatti 4 Lungs: Diminished at bases with crackles, +accessory muscle use and tachypneic Cardiovascular: Regular, sinus on monitor, ext feel warm Abdomen: soft, hypoactive BS Extremities: 1+ edema Neuro: moves all 4 ext to command Skin: no rashes  BUN/Cr rising BNP up Lactate up Echo read pending: looks like < 10% ef to me  Resolved Hospital Problem list   N/a  Assessment & Plan:  Decompensated HFrEF Cardiogenic shock Cardiorenal syndrome, hx of nephrectomy Class 5 obesity OSA on CPAP DM2 with hyperglycemia  - Trial of HFNC (does not  tolerate BIPAP mask, his nasal pillows do not interface with our BIPAP machine) - Central line, co-ox, inotropes - Push diuresis - Insulin drip - High intubation/CRRT risk - Will follow with you  Best Practice (right click and "Reselect all SmartList Selections" daily)  Per primary  Labs   CBC: Recent Labs  Lab 04/29/21 1247  WBC 9.6  HGB 15.0  HCT 45.3  MCV 83.4  PLT 716    Basic Metabolic Panel: Recent Labs  Lab 04/29/21 1247 04/30/21 0555  NA 137 134*  K 3.6 4.4  CL 106 105  CO2 20* 16*  GLUCOSE 253* 322*  BUN 43* 44*  CREATININE 1.99* 2.20*  CALCIUM 9.3 9.3  MG  --  2.1   GFR: Estimated Creatinine Clearance: 60.6 mL/min (A) (by C-G formula based on SCr of 2.2 mg/dL (H)). Recent Labs  Lab 04/29/21 1247 04/30/21 0955  WBC 9.6  --   LATICACIDVEN  --  2.7*    Liver Function Tests: Recent Labs  Lab 04/30/21 0555  AST 43*  ALT 135*  ALKPHOS 55  BILITOT 0.7  PROT 6.8  ALBUMIN 3.2*   No results for input(s): LIPASE, AMYLASE in the last 168 hours. No results for input(s): AMMONIA in the last 168 hours.  ABG    Component Value Date/Time   PHART 7.289 (L) 05/15/2020 1046   PCO2ART 44.5 05/15/2020 1046   PO2ART 138 (H) 05/15/2020 1046   HCO3 20.9 05/15/2020 1046   TCO2 24 05/15/2020 1027   ACIDBASEDEF 4.7 (H)  05/15/2020 1046   O2SAT 98.2 05/15/2020 1046     Coagulation Profile: No results for input(s): INR, PROTIME in the last 168 hours.  Cardiac Enzymes: No results for input(s): CKTOTAL, CKMB, CKMBINDEX, TROPONINI in the last 168 hours.  HbA1C: Hemoglobin A1C  Date/Time Value Ref Range Status  06/24/2020 09:20 AM 7.5 (A) 4.0 - 5.6 % Final  03/19/2020 10:22 AM 8.1 (A) 4.0 - 5.6 % Final   HbA1c, POC (prediabetic range)  Date/Time Value Ref Range Status  06/24/2020 09:20 AM 7.5 (A) 5.7 - 6.4 % Final  03/19/2020 10:22 AM 8.1 (A) 5.7 - 6.4 % Final   HbA1c, POC (controlled diabetic range)  Date/Time Value Ref Range Status  06/24/2020  09:20 AM 7.5 (A) 0.0 - 7.0 % Final  03/19/2020 10:22 AM 8.1 (A) 0.0 - 7.0 % Final   HbA1c POC (<> result, manual entry)  Date/Time Value Ref Range Status  06/24/2020 09:20 AM 7.5 4.0 - 5.6 % Final  03/19/2020 10:22 AM 8.1 4.0 - 5.6 % Final   Hgb A1c MFr Bld  Date/Time Value Ref Range Status  10/28/2020 09:32 AM 8.6 (H) 4.8 - 5.6 % Final    Comment:             Prediabetes: 5.7 - 6.4          Diabetes: >6.4          Glycemic control for adults with diabetes: <7.0     CBG: Recent Labs  Lab 04/30/21 0737 04/30/21 1159  GLUCAP 310* 337*    Review of Systems:    Positive Symptoms in bold:  Constitutional fevers, chills, weight loss, fatigue, anorexia, malaise  Eyes decreased vision, double vision, eye irritation  Ears, Nose, Mouth, Throat sore throat, trouble swallowing, sinus congestion  Cardiovascular chest pain, paroxysmal nocturnal dyspnea, lower ext edema, palpitations   Respiratory SOB, cough, DOE, hemoptysis, wheezing  Gastrointestinal nausea, vomiting, diarrhea  Genitourinary burning with urination, trouble urinating  Musculoskeletal joint aches, joint swelling, back pain  Integumentary  rashes, skin lesions  Neurological focal weakness, focal numbness, trouble speaking, headaches  Psychiatric depression, anxiety, confusion  Endocrine polyuria, polydipsia, cold intolerance, heat intolerance  Hematologic abnormal bruising, abnormal bleeding, unexplained nose bleeds  Allergic/Immunologic recurrent infections, hives, swollen lymph nodes     Past Medical History:  He,  has a past medical history of Anxiety, Aspirin allergy, Childhood asthma, Chronic systolic CHF (congestive heart failure) ([place]), CKD (chronic kidney disease) stage 2, GFR 60-89 ml/min, H/O vasectomy (12/2019), High cholesterol, Hypertension, Morbid obesity ([place]), Nephrolithiasis, OSA on CPAP, Paroxysmal atrial fibrillation ([place]), Presumed NICM, Renal cell carcinoma ([place]), Troponin level elevated, Type II  diabetes mellitus ([place]), and Ventricular tachycardia ([place]).   Surgical History:   Past Surgical History:  Procedure Laterality Date   APPENDECTOMY  07/2004   BIOPSY  02/10/2021   Procedure: BIOPSY;  Surgeon: Gatha Mayer, MD;  Location: [facility];  Service: Endoscopy;;   CARDIAC CATHETERIZATION N/A 05/17/2015   Procedure: Right/Left Heart Cath and Coronary Angiography;  Surgeon: Jolaine Artist, MD;  Location: [place] [facility];  Service: Cardiovascular;  Laterality: N/A;   COLONOSCOPY WITH PROPOFOL N/A 02/10/2021   Procedure: COLONOSCOPY WITH PROPOFOL;  Surgeon: Gatha Mayer, MD;  Location: [facility];  Service: Endoscopy;  Laterality: N/A;   EP IMPLANTABLE DEVICE N/A 05/17/2015   Procedure: SubQ ICD Implant;  Surgeon: Will Meredith Leeds, MD;  Location: [place] [facility];  Service: Cardiovascular;  Laterality: N/A;   ESOPHAGOGASTRODUODENOSCOPY (EGD) WITH  PROPOFOL N/A 02/10/2021   Procedure: ESOPHAGOGASTRODUODENOSCOPY (EGD) WITH PROPOFOL;  Surgeon: Gatha Mayer, MD;  Location: [facility];  Service: Endoscopy;  Laterality: N/A;   ROBOTIC ASSISTED LAPAROSCOPIC LYSIS OF ADHESION  01/13/2013   Procedure: ROBOTIC ASSISTED LAPAROSCOPIC LYSIS OF ADHESION EXTENSIVE;  Surgeon: Alexis Frock, MD;  Location: [facility];  Service: Urology;;   ROBOTIC ASSITED PARTIAL NEPHRECTOMY Right 01/13/2013   Procedure: ROBOTIC ASSITED PARTIAL NEPHRECTOMY CONVERTED TO ROBOTIC ASSISTED RIGHT RADICAL NEPHRECTOMY;  Surgeon: Alexis Frock, MD;  Location: [facility];  Service: Urology;  Laterality: Right;   SUBQ ICD CHANGEOUT N/A 05/16/2020   Procedure: SUBQ ICD CHANGEOUT;  Surgeon: [name], MD;  Location: [place] [facility];  Service: Cardiovascular;  Laterality: N/A;   SUBQ ICD CHANGEOUT N/A 05/15/2020   Procedure: SUBQ ICD CHANGEOUT;  Surgeon: [name], MD;  Location: [place] [facility];  Service: Cardiovascular;  Laterality: N/A;   VASECTOMY       Social History:   reports that he has been  smoking cigarettes. He has a 5.50 pack-year smoking history. He has never used smokeless tobacco. He reports current alcohol use. He reports current drug use. Frequency: 14.00 times per week. Drug: Marijuana.   Family History:  His family history includes Diabetes in his maternal aunt; Hypertension in his mother. There is no history of Heart attack, Stroke, Colon cancer, Pancreatic cancer, Stomach cancer, Liver cancer, or Esophageal cancer.   Allergies Allergies  Allergen Reactions   Aspirin Shortness Of Breath, Itching and Rash     Burning sensation (Patient reports he tolerates other NSAIDS)    Bee Venom Hives and Swelling   Lisinopril Cough   Tomato Rash     Home Medications  Prior to Admission medications   Medication Sig Start Date End Date Taking? Authorizing Provider  allopurinol (ZYLOPRIM) 100 MG tablet Take 1 tablet (100 mg total) by mouth daily. 01/09/21  Yes Clegg, Amy D, NP  amiodarone (PACERONE) 100 MG tablet TAKE 1 TABLET (100 MG TOTAL) BY MOUTH DAILY. Patient taking differently: Take 100 mg by mouth daily. 01/09/21 01/09/22 Yes Clegg, Amy D, NP  apixaban (ELIQUIS) 5 MG TABS tablet TAKE 1 TABLET (5 MG TOTAL) BY MOUTH 2 (TWO) TIMES DAILY. RESUME WITH EVENING DOSE ON SUNDAY Patient taking differently: Take 5 mg by mouth 2 (two) times daily. 01/09/21 01/09/22 Yes Clegg, Amy D, NP  busPIRone (BUSPAR) 10 MG tablet TAKE 1 TABLET (10 MG TOTAL) BY MOUTH 2 (TWO) TIMES DAILY. Patient taking differently: Take 10 mg by mouth 2 (two) times daily. 06/24/20 06/24/21 Yes [name], FNP  carvedilol (COREG) 6.25 MG tablet TAKE 1 TABLET (6.25 MG TOTAL) BY MOUTH 2 (TWO) TIMES DAILY. Patient taking differently: Take 6.25 mg by mouth 2 (two) times daily. 08/07/20 08/07/21 Yes Bensimhon, Shaune Pascal, MD  cetirizine (ZYRTEC) 10 MG tablet Take 1 tablet (10 mg total) by mouth daily. 06/27/20  Yes [name], FNP  empagliflozin (JARDIANCE) 10 MG TABS tablet TAKE 1 TABLET (10 MG TOTAL) BY MOUTH DAILY  BEFORE BREAKFAST. Patient taking differently: Take 10 mg by mouth daily before breakfast. 12/06/20 12/06/21 Yes Milford, Maricela Bo, FNP  famotidine (PEPCID) 40 MG tablet TAKE 1 TABLET (40 MG TOTAL) BY MOUTH DAILY AS NEEDED FOR HEARTBURN OR INDIGESTION. Patient taking differently: Take 40 mg by mouth daily as needed for indigestion or heartburn. 12/25/20 12/25/21 Yes Gatha Mayer, MD  ferrous sulfate 325 (65 FE) MG tablet Take 1 tablet (325 mg total) by mouth  daily with breakfast. 03/04/21  Yes Gatha Mayer, MD  fluticasone [facility]) 50 MCG/ACT nasal spray Place 2 sprays into both nostrils daily. Patient taking differently: Place 2 sprays into both nostrils daily as needed for allergies. 09/18/19  Yes [name], FNP  gabapentin (NEURONTIN) 300 MG capsule Take 1 capsule (300 mg total) by mouth 3 (three) times daily. 06/27/20  Yes [name], FNP  glipiZIDE (GLUCOTROL) 10 MG tablet Take 1 tablet (10 mg total) by mouth 2 (two) times daily before a meal. 04/22/21  Yes King, Diona Foley, NP  insulin aspart (NOVOLOG) 100 UNIT/ML FlexPen INJECT 0-15 UNIT(S) INTO THE SKIN THREE TIMES DAILY WITH MEALS VIA SLIDING SCALE Patient taking differently: Inject 0-15 Units into the skin 3 (three) times daily with meals. 11/15/20 11/15/21 Yes [name], FNP  insulin NPH-regular Human (70-30) 100 UNIT/ML injection INJECT 38 UNITS INTO THE SKIN 2 (TWO) TIMES DAILY WITH A MEAL. Patient taking differently: Inject 38 Units into the skin 2 (two) times daily with a meal. 06/27/20 06/27/21 Yes [name], FNP  metFORMIN (GLUCOPHAGE) 1000 MG tablet Take 1 tablet (1,000 mg total) by mouth 2 (two) times daily with a meal. 01/09/21  Yes Clegg, Amy D, NP  oxyCODONE (ROXICODONE) 5 MG immediate release tablet Take 1 tablet (5 mg total) by mouth every 6 (six) hours as needed for up to 10 doses for breakthrough pain. 04/12/21  Yes Curatolo, Adam, DO  sacubitril-valsartan (ENTRESTO) 49-51 MG TAKE 1 TABLET BY MOUTH 2 (TWO)  TIMES DAILY. 01/24/21  Yes Clegg, Amy D, NP  spironolactone (ALDACTONE) 25 MG tablet Take 1 tablet (25 mg total) by mouth daily. 01/09/21 08/07/21 Yes Clegg, Amy D, NP  torsemide (DEMADEX) 20 MG tablet TAKE 1 TABLET (20 MG TOTAL) BY MOUTH DAILY. Patient taking differently: Take 20 mg by mouth daily. 01/09/21 01/09/22 Yes Clegg, Amy D, NP  traZODone (DESYREL) 100 MG tablet Take 1 tablet (100 mg total) by mouth at bedtime. For sleep 03/05/21  Yes Vevelyn Francois, NP  Vitamin D, Ergocalciferol, (DRISDOL) 1.25 MG (50000 UNIT) CAPS capsule TAKE 1 CAPSULE (50,000 UNITS TOTAL) BY MOUTH ONCE A WEEK. Patient taking differently: Take 50,000 Units by mouth every Monday. 10/29/20 10/29/21 Yes [name], FNP  Accu-Chek FastClix Lancets MISC USE AS DIRECTED FOUR TIMES DAILY 01/29/21 01/29/22  Vevelyn Francois, NP  blood glucose meter kit and supplies Use up to four times daily as directed. 01/27/21   Vevelyn Francois, NP  Continuous Blood Gluc Receiver (FREESTYLE LIBRE 14 DAY READER) DEVI 1 each by Does not apply route 3 (three) times daily between meals as needed. 03/19/20   [name], FNP  Continuous Blood Gluc Sensor (GUARDIAN SENSOR 3) MISC 1 Device by Does not apply route 3 (three) times daily between meals as needed. 03/25/20   [name], FNP  Insulin Pen Needle 31G X 5 MM MISC USE AS DIRECTED 11/27/20 11/27/21  [name], FNP  Insulin Syringe-Needle U-100 31G X 5/16" 0.5 ML MISC USE TO INJECT INSULIN TWICE DAILY. 09/17/20 09/17/21  [name], FNP  Magnesium Oxide 200 MG TABS Take 100 mg by mouth daily.    [provider]  predniSONE (DELTASONE) 10 MG tablet take 2 tablets by mouth for 2 days, then 1 tab for 2 days, then stop Patient not taking: Reported on 04/29/2021 04/16/21   Fenton Foy, NP  Colchicine (MITIGARE) 0.6 MG CAPS Take 1 tablet by mouth 2 (two)  times daily as needed (gout). 06/27/20 11/26/20  [name], FNP     Critical care time: 35 minutes not  including any separately billable procedures

## 2021-04-30 NOTE — Anesthesia Preprocedure Evaluation (Addendum)
Anesthesia Evaluation  Patient identified by MRN, date of birth, ID band  Reviewed: Allergy & Precautions, NPO status , Patient's Chart, lab work & pertinent test results  Airway Mallampati: III  TM Distance: >3 FB Neck ROM: Full    Dental no notable dental hx.    Pulmonary asthma , sleep apnea and Continuous Positive Airway Pressure Ventilation , Current Smoker and Patient abstained from smoking.,  On 4L O2 currently   Pulmonary exam normal + rhonchi        Cardiovascular hypertension, +CHF  Normal cardiovascular exam+ Cardiac Defibrillator  Rhythm:Regular Rate:Normal     Neuro/Psych Anxiety    GI/Hepatic negative GI ROS, Neg liver ROS,   Endo/Other  diabetesMorbid obesity  Renal/GU Renal disease     Musculoskeletal  (+) Arthritis ,   Abdominal (+) + obese,   Peds  Hematology   Anesthesia Other Findings   Reproductive/Obstetrics                            Anesthesia Physical Anesthesia Plan  ASA: 4  Anesthesia Plan: MAC   Post-op Pain Management:    Induction: Intravenous  PONV Risk Score and Plan: 1 and Propofol infusion and TIVA  Airway Management Planned: Natural Airway and Nasal Cannula  Additional Equipment: None  Intra-op Plan:   Post-operative Plan:   Informed Consent: I have reviewed the patients History and Physical, chart, labs and discussed the procedure including the risks, benefits and alternatives for the proposed anesthesia with the patient or authorized representative who has indicated his/her understanding and acceptance.       Plan Discussed with: CRNA and Anesthesiologist  Anesthesia Plan Comments:         Anesthesia Quick Evaluation

## 2021-05-01 ENCOUNTER — Inpatient Hospital Stay (HOSPITAL_COMMUNITY): Payer: Medicaid Other

## 2021-05-01 ENCOUNTER — Encounter (HOSPITAL_COMMUNITY): Payer: Self-pay | Admitting: Cardiology

## 2021-05-01 DIAGNOSIS — J9602 Acute respiratory failure with hypercapnia: Secondary | ICD-10-CM | POA: Diagnosis not present

## 2021-05-01 DIAGNOSIS — R570 Cardiogenic shock: Secondary | ICD-10-CM | POA: Diagnosis not present

## 2021-05-01 DIAGNOSIS — I130 Hypertensive heart and chronic kidney disease with heart failure and stage 1 through stage 4 chronic kidney disease, or unspecified chronic kidney disease: Secondary | ICD-10-CM | POA: Diagnosis not present

## 2021-05-01 DIAGNOSIS — J15211 Pneumonia due to Methicillin susceptible Staphylococcus aureus: Secondary | ICD-10-CM | POA: Diagnosis not present

## 2021-05-01 DIAGNOSIS — J810 Acute pulmonary edema: Secondary | ICD-10-CM | POA: Diagnosis not present

## 2021-05-01 DIAGNOSIS — J9601 Acute respiratory failure with hypoxia: Secondary | ICD-10-CM | POA: Diagnosis not present

## 2021-05-01 DIAGNOSIS — I4819 Other persistent atrial fibrillation: Secondary | ICD-10-CM | POA: Diagnosis not present

## 2021-05-01 DIAGNOSIS — I4891 Unspecified atrial fibrillation: Secondary | ICD-10-CM | POA: Diagnosis not present

## 2021-05-01 DIAGNOSIS — R34 Anuria and oliguria: Secondary | ICD-10-CM | POA: Diagnosis not present

## 2021-05-01 DIAGNOSIS — E870 Hyperosmolality and hypernatremia: Secondary | ICD-10-CM | POA: Diagnosis not present

## 2021-05-01 DIAGNOSIS — I517 Cardiomegaly: Secondary | ICD-10-CM | POA: Diagnosis not present

## 2021-05-01 DIAGNOSIS — I428 Other cardiomyopathies: Secondary | ICD-10-CM | POA: Diagnosis not present

## 2021-05-01 DIAGNOSIS — I5023 Acute on chronic systolic (congestive) heart failure: Secondary | ICD-10-CM | POA: Diagnosis not present

## 2021-05-01 DIAGNOSIS — Z20822 Contact with and (suspected) exposure to covid-19: Secondary | ICD-10-CM | POA: Diagnosis not present

## 2021-05-01 DIAGNOSIS — J9690 Respiratory failure, unspecified, unspecified whether with hypoxia or hypercapnia: Secondary | ICD-10-CM | POA: Diagnosis not present

## 2021-05-01 LAB — HEPARIN LEVEL (UNFRACTIONATED): Heparin Unfractionated: >1.1 IU/mL — ABNORMAL HIGH (ref 0.30–0.70)

## 2021-05-01 LAB — POCT I-STAT 7, (LYTES, BLD GAS, ICA,H+H)
Acid-base deficit: 1 mmol/L (ref 0.0–2.0)
Bicarbonate: 22.5 mmol/L (ref 20.0–28.0)
Calcium, Ion: 1.24 mmol/L (ref 1.15–1.40)
HCT: 45 % (ref 39.0–52.0)
Hemoglobin: 15.3 g/dL (ref 13.0–17.0)
O2 Saturation: 100 %
Patient temperature: 98.6
Potassium: 4.5 mmol/L (ref 3.5–5.1)
Sodium: 140 mmol/L (ref 135–145)
TCO2: 24 mmol/L (ref 22–32)
pCO2 arterial: 34.6 mmHg (ref 32.0–48.0)
pH, Arterial: 7.421 (ref 7.350–7.450)
pO2, Arterial: 290 mmHg — ABNORMAL HIGH (ref 83.0–108.0)

## 2021-05-01 LAB — COOXEMETRY PANEL
Carboxyhemoglobin: 0.5 % (ref 0.5–1.5)
Carboxyhemoglobin: 0.7 % (ref 0.5–1.5)
Methemoglobin: 0.9 % (ref 0.0–1.5)
Methemoglobin: 1 % (ref 0.0–1.5)
O2 Saturation: 50.4 %
O2 Saturation: 76.3 %
Total hemoglobin: 15.2 g/dL (ref 12.0–16.0)
Total hemoglobin: 14.8 g/dL (ref 12.0–16.0)

## 2021-05-01 LAB — GLUCOSE, CAPILLARY
Glucose-Capillary: 114 mg/dL — ABNORMAL HIGH (ref 70–99)
Glucose-Capillary: 129 mg/dL — ABNORMAL HIGH (ref 70–99)
Glucose-Capillary: 131 mg/dL — ABNORMAL HIGH (ref 70–99)
Glucose-Capillary: 133 mg/dL — ABNORMAL HIGH (ref 70–99)
Glucose-Capillary: 146 mg/dL — ABNORMAL HIGH (ref 70–99)
Glucose-Capillary: 152 mg/dL — ABNORMAL HIGH (ref 70–99)
Glucose-Capillary: 158 mg/dL — ABNORMAL HIGH (ref 70–99)
Glucose-Capillary: 161 mg/dL — ABNORMAL HIGH (ref 70–99)
Glucose-Capillary: 170 mg/dL — ABNORMAL HIGH (ref 70–99)
Glucose-Capillary: 197 mg/dL — ABNORMAL HIGH (ref 70–99)
Glucose-Capillary: 221 mg/dL — ABNORMAL HIGH (ref 70–99)
Glucose-Capillary: 230 mg/dL — ABNORMAL HIGH (ref 70–99)
Glucose-Capillary: 260 mg/dL — ABNORMAL HIGH (ref 70–99)
Glucose-Capillary: 288 mg/dL — ABNORMAL HIGH (ref 70–99)
Glucose-Capillary: 292 mg/dL — ABNORMAL HIGH (ref 70–99)
Glucose-Capillary: 302 mg/dL — ABNORMAL HIGH (ref 70–99)
Glucose-Capillary: 337 mg/dL — ABNORMAL HIGH (ref 70–99)

## 2021-05-01 LAB — BASIC METABOLIC PANEL
Anion gap: 9 (ref 5–15)
Anion gap: 15 (ref 5–15)
BUN: 54 mg/dL — ABNORMAL HIGH (ref 6–20)
BUN: 55 mg/dL — ABNORMAL HIGH (ref 6–20)
CO2: 24 mmol/L (ref 22–32)
CO2: 19 mmol/L — ABNORMAL LOW (ref 22–32)
Calcium: 9.3 mg/dL (ref 8.9–10.3)
Calcium: 9.3 mg/dL (ref 8.9–10.3)
Chloride: 106 mmol/L (ref 98–111)
Chloride: 105 mmol/L (ref 98–111)
Creatinine, Ser: 4.65 mg/dL — ABNORMAL HIGH (ref 0.61–1.24)
Creatinine, Ser: 4.57 mg/dL — ABNORMAL HIGH (ref 0.61–1.24)
GFR, Estimated: 15 mL/min — ABNORMAL LOW (ref >60)
GFR, Estimated: 15 mL/min — ABNORMAL LOW (ref >60)
Glucose, Bld: 144 mg/dL — ABNORMAL HIGH (ref 70–99)
Glucose, Bld: 216 mg/dL — ABNORMAL HIGH (ref 70–99)
Potassium: 4.3 mmol/L (ref 3.5–5.1)
Potassium: 3.7 mmol/L (ref 3.5–5.1)
Sodium: 139 mmol/L (ref 135–145)
Sodium: 139 mmol/L (ref 135–145)

## 2021-05-01 LAB — LACTIC ACID, PLASMA: Lactic Acid, Venous: 2.4 mmol/L (ref 0.5–1.9)

## 2021-05-01 LAB — TRIGLYCERIDES: Triglycerides: 182 mg/dL — ABNORMAL HIGH (ref <150)

## 2021-05-01 LAB — APTT: aPTT: 69 seconds — ABNORMAL HIGH (ref 24–36)

## 2021-05-01 LAB — MAGNESIUM: Magnesium: 2.3 mg/dL (ref 1.7–2.4)

## 2021-05-01 MED ORDER — HEPARIN (PORCINE) 25000 UT/250ML-% IV SOLN
2050.0000 [IU]/h | INTRAVENOUS | Status: DC
  Administered 2021-05-01: 1500 [IU]/h via INTRAVENOUS
  Administered 2021-05-02: 1800 [IU]/h via INTRAVENOUS
  Administered 2021-05-03 – 2021-05-05 (×5): 2050 [IU]/h via INTRAVENOUS
  Filled 2021-05-01 (×8): qty 250

## 2021-05-01 MED ORDER — VITAL HIGH PROTEIN PO LIQD
1000.0000 mL | ORAL | Status: DC
  Administered 2021-05-02 – 2021-05-06 (×5): 1000 mL

## 2021-05-01 MED ORDER — PANTOPRAZOLE SODIUM 40 MG PO PACK
40.0000 mg | PACK | Freq: Every day | ORAL | Status: DC
  Administered 2021-05-01 – 2021-05-08 (×8): 40 mg
  Filled 2021-05-01 (×8): qty 20

## 2021-05-01 MED ORDER — LINEZOLID 600 MG/300ML IV SOLN
600.0000 mg | Freq: Two times a day (BID) | INTRAVENOUS | Status: DC
  Administered 2021-05-01 (×2): 600 mg via INTRAVENOUS
  Filled 2021-05-01 (×3): qty 300

## 2021-05-01 MED ORDER — PROSOURCE TF PO LIQD: 45.0000 mL | Freq: Two times a day (BID) | ORAL | Status: DC

## 2021-05-01 MED ORDER — INSULIN ASPART 100 UNIT/ML IJ SOLN
4.0000 [IU] | INTRAMUSCULAR | Status: DC
Start: 2021-05-01 — End: 2021-05-04
  Administered 2021-05-01 – 2021-05-04 (×17): 4 [IU] via SUBCUTANEOUS

## 2021-05-01 MED ORDER — VITAL HIGH PROTEIN PO LIQD
1000.0000 mL | ORAL | Status: DC
  Administered 2021-05-01: 1000 mL

## 2021-05-01 MED ORDER — ALPRAZOLAM 0.5 MG PO TABS
0.5000 mg | ORAL_TABLET | Freq: Two times a day (BID) | ORAL | Status: DC
  Administered 2021-05-01 – 2021-05-08 (×16): 0.5 mg
  Filled 2021-05-01 (×16): qty 1

## 2021-05-01 MED ORDER — INSULIN ASPART 100 UNIT/ML IJ SOLN
0.0000 [IU] | INTRAMUSCULAR | Status: DC
  Administered 2021-05-01: 7 [IU] via SUBCUTANEOUS
  Administered 2021-05-01: 15 [IU] via SUBCUTANEOUS
  Administered 2021-05-01 (×2): 11 [IU] via SUBCUTANEOUS
  Administered 2021-05-02: 4 [IU] via SUBCUTANEOUS
  Administered 2021-05-02: 15 [IU] via SUBCUTANEOUS
  Administered 2021-05-02: 7 [IU] via SUBCUTANEOUS
  Administered 2021-05-02 (×2): 4 [IU] via SUBCUTANEOUS
  Administered 2021-05-03: 7 [IU] via SUBCUTANEOUS
  Administered 2021-05-03: 4 [IU] via SUBCUTANEOUS
  Administered 2021-05-03: 7 [IU] via SUBCUTANEOUS
  Administered 2021-05-03: 4 [IU] via SUBCUTANEOUS
  Administered 2021-05-03: 3 [IU] via SUBCUTANEOUS
  Administered 2021-05-03 – 2021-05-04 (×2): 4 [IU] via SUBCUTANEOUS
  Administered 2021-05-04: 7 [IU] via SUBCUTANEOUS
  Administered 2021-05-04 (×2): 4 [IU] via SUBCUTANEOUS
  Administered 2021-05-04: 11 [IU] via SUBCUTANEOUS
  Administered 2021-05-04: 4 [IU] via SUBCUTANEOUS
  Administered 2021-05-05: 3 [IU] via SUBCUTANEOUS
  Administered 2021-05-05 (×5): 4 [IU] via SUBCUTANEOUS
  Administered 2021-05-05 – 2021-05-06 (×2): 7 [IU] via SUBCUTANEOUS
  Administered 2021-05-06: 4 [IU] via SUBCUTANEOUS
  Administered 2021-05-06: 3 [IU] via SUBCUTANEOUS
  Administered 2021-05-06: 15 [IU] via SUBCUTANEOUS
  Administered 2021-05-06: 4 [IU] via SUBCUTANEOUS
  Administered 2021-05-07: 3 [IU] via SUBCUTANEOUS
  Administered 2021-05-07: 7 [IU] via SUBCUTANEOUS
  Administered 2021-05-07: 4 [IU] via SUBCUTANEOUS
  Administered 2021-05-07: 7 [IU] via SUBCUTANEOUS
  Administered 2021-05-07: 4 [IU] via SUBCUTANEOUS
  Administered 2021-05-07: 11 [IU] via SUBCUTANEOUS
  Administered 2021-05-08 (×2): 7 [IU] via SUBCUTANEOUS
  Administered 2021-05-08: 3 [IU] via SUBCUTANEOUS
  Administered 2021-05-08: 11 [IU] via SUBCUTANEOUS
  Administered 2021-05-08: 4 [IU] via SUBCUTANEOUS
  Administered 2021-05-08: 11 [IU] via SUBCUTANEOUS
  Administered 2021-05-09: 7 [IU] via SUBCUTANEOUS
  Administered 2021-05-09: 4 [IU] via SUBCUTANEOUS
  Administered 2021-05-09 (×2): 11 [IU] via SUBCUTANEOUS
  Administered 2021-05-09: 3 [IU] via SUBCUTANEOUS
  Administered 2021-05-09: 4 [IU] via SUBCUTANEOUS
  Administered 2021-05-10 (×2): 3 [IU] via SUBCUTANEOUS
  Administered 2021-05-10 – 2021-05-11 (×3): 11 [IU] via SUBCUTANEOUS
  Administered 2021-05-11: 20 [IU] via SUBCUTANEOUS
  Administered 2021-05-11: 4 [IU] via SUBCUTANEOUS
  Administered 2021-05-11: 7 [IU] via SUBCUTANEOUS
  Administered 2021-05-11: 4 [IU] via SUBCUTANEOUS
  Administered 2021-05-11: 11 [IU] via SUBCUTANEOUS
  Administered 2021-05-12 (×2): 3 [IU] via SUBCUTANEOUS
  Administered 2021-05-12: 20 [IU] via SUBCUTANEOUS
  Administered 2021-05-12: 7 [IU] via SUBCUTANEOUS
  Administered 2021-05-13: 4 [IU] via SUBCUTANEOUS
  Administered 2021-05-13: 7 [IU] via SUBCUTANEOUS

## 2021-05-01 MED ORDER — PROSOURCE TF PO LIQD
45.0000 mL | Freq: Three times a day (TID) | ORAL | Status: DC
  Administered 2021-05-01 – 2021-05-09 (×24): 45 mL
  Filled 2021-05-01 (×23): qty 45

## 2021-05-01 MED ORDER — SODIUM CHLORIDE 0.9 % IV SOLN
2.0000 g | Freq: Two times a day (BID) | INTRAVENOUS | Status: DC
  Administered 2021-05-01 (×2): 2 g via INTRAVENOUS
  Filled 2021-05-01 (×3): qty 2

## 2021-05-01 MED ORDER — DOBUTAMINE IN D5W 4-5 MG/ML-% IV SOLN
1.0000 ug/kg/min | INTRAVENOUS | Status: DC
  Administered 2021-05-01 – 2021-05-03 (×3): 5 ug/kg/min via INTRAVENOUS
  Administered 2021-05-04: 2 ug/kg/min via INTRAVENOUS
  Filled 2021-05-01 (×5): qty 250

## 2021-05-01 MED ORDER — INSULIN DETEMIR 100 UNIT/ML ~~LOC~~ SOLN
40.0000 [IU] | Freq: Two times a day (BID) | SUBCUTANEOUS | Status: DC
  Administered 2021-05-01 (×2): 40 [IU] via SUBCUTANEOUS
  Filled 2021-05-01 (×4): qty 0.4

## 2021-05-01 NOTE — Progress Notes (Signed)
ANTICOAGULATION CONSULT NOTE - Follow up [place] for heparin Indication: atrial fibrillation  Allergies  Allergen Reactions   Aspirin Shortness Of Breath, Itching and Rash     Burning sensation (Patient reports he tolerates other NSAIDS)    Bee Venom Hives and Swelling   Lisinopril Cough   Tomato Rash    Patient Measurements: Height: '5\' 8"'$  (172.7 cm) Weight: (!) 146.2 kg (322 lb 5 oz) IBW/kg (Calculated) : 68.4 Heparin Dosing Weight: 103kg  Vital Signs: Temp: 99.3 F (37.4 C) (07/28 1500) Temp Source: Axillary (07/28 0748) BP: 74/58 (07/28 0750) Pulse Rate: 80 (07/28 0800)  Labs: Recent Labs    04/29/21 1247 04/29/21 1659 04/30/21 0555 04/30/21 1754 04/30/21 1937 05/01/21 0107 05/01/21 0334 05/01/21 1356 05/01/21 1655  HGB 15.0  --   --  17.3* 17.0 15.3  --   --   --   HCT 45.3  --   --  51.0 50.0 45.0  --   --   --   PLT 221  --   --   --   --   --   --   --   --   APTT  --   --   --   --   --   --   --   --  69*  HEPARINUNFRC  --   --   --   --   --   --   --   --  >1.10*  CREATININE 1.99*  --  2.20*  --   --   --  4.65* 4.57*  --   TROPONINIHS 74* 64*  --   --   --   --   --   --   --      Estimated Creatinine Clearance: 28.4 mL/min (A) (by C-G formula based on SCr of 4.57 mg/dL (H)).   Medical History: Past Medical History:  Diagnosis Date   Anxiety    Aspirin allergy    Childhood asthma    Chronic systolic CHF (congestive heart failure) ([place])    a. EF 20-25% in 2012. b. EF 45-50% in 10/2011 with nonischemic nuc - presumed NICM. c. 12/2014 Echo: Sev depressed LV fxn, sev dil LV, mild LVH, mild MR, sev dil LA, mildly reduced RV fxn.   CKD (chronic kidney disease) stage 2, GFR 60-89 ml/min    H/O vasectomy 12/2019   High cholesterol    Hypertension    Morbid obesity ([place])    Nephrolithiasis    OSA on CPAP    Paroxysmal atrial fibrillation ([place])    Presumed NICM    a. 04/2014 Myoview: EF 26%, glob HK, sev glob HK, ? prior infarct;   b. Never cathed 2/2 CKD.   Renal cell carcinoma ([place])    a. s/p Rt robotic assisted partial converted to radical nephrectomy on 01/2013.   Troponin level elevated    a. 04/2014, 12/2014: felt due to CHF.   Type II diabetes mellitus ([facility])    Ventricular tachycardia ([facility])    a. appropriate ICD therapy 12/2017    Assessment: 45 year old male s/p emergent TEE/cardioversion due to hemodynamic instability 7/27. Patient was anticoagulated with apixaban. Given multiorgan failure will transition patient over to heparin. Will need to be aggressive with dosing given recent cardioversion. CBC appears stable overnight. Given recent apixaban use will monitor and dose adjust based on aptts until they correlate with heparin levels.  Heparin drip 1500 uts/hr aptt 69sec at goal.  Goal of Therapy:  Heparin level 0.3-0.7 units/ml Aptt 66-103 sec Monitor platelets by anticoagulation protocol: Yes   Plan:  Continue Heparin drip 1500 uts/hr  Daily aptt/heparin level Monitor CBC and s/s bleeding   Bonnita Nasuti Pharm.D. CPP, BCPS Clinical Pharmacist [phone] 05/01/2021 5:41 PM

## 2021-05-01 NOTE — Progress Notes (Addendum)
Progress Note  Patient Name: Jeffrey [name] Date of Encounter: 05/01/2021  [facility] Cardiologist: Glori Bickers, MD   Subjective   Intubated and sedated  Inpatient Medications    Scheduled Meds:  apixaban  5 mg Per Tube BID   chlorhexidine gluconate (MEDLINE KIT)  15 mL Mouth Rinse BID   Chlorhexidine Gluconate Cloth  6 each Topical Daily   docusate  100 mg Per Tube BID   loratadine  10 mg Per Tube Daily   mouth rinse  15 mL Mouth Rinse 10 times per day   polyethylene glycol  17 g Per Tube Daily   sodium chloride flush  10-40 mL Intracatheter Q12H   sodium chloride flush  3 mL Intravenous Q12H   Continuous Infusions:  sodium chloride Stopped (04/30/21 2254)   amiodarone 30 mg/hr (05/01/21 0600)   ceFEPime (MAXIPIME) IV     fentaNYL infusion INTRAVENOUS 50 mcg/hr (05/01/21 0600)   furosemide (LASIX) 200 mg in dextrose 5% 100 mL (54m/mL) infusion 12 mg/hr (05/01/21 0600)   insulin 4.4 Units/hr (05/01/21 0600)   linezolid (ZYVOX) IV     norepinephrine (LEVOPHED) Adult infusion 14 mcg/min (05/01/21 0600)   phenylephrine     propofol (DIPRIVAN) infusion 15 mcg/kg/min (05/01/21 0600)    sodium bicarbonate (isotonic) infusion in sterile water 50 mL/hr at 05/01/21 0600   PRN Meds: sodium chloride, acetaminophen, ALPRAZolam, dextrose, famotidine, fentaNYL, nitroGLYCERIN, ondansetron (ZOFRAN) IV, oxyCODONE, sodium chloride flush, sodium chloride flush, zolpidem   Vital Signs    Vitals:   05/01/21 0400 05/01/21 0456 05/01/21 0500 05/01/21 0600  BP: 91/63  99/78 (!) 84/62  Pulse:   64 (!) 57  Resp: (!) 30  (!) 27 (!) 30  Temp:      TempSrc:      SpO2:   100% 100%  Weight:  (!) 146.2 kg    Height:  '5\' 8"'  (1.727 m)      Intake/Output Summary (Last 24 hours) at 05/01/2021 0734 Last data filed at 05/01/2021 0600 Gross per 24 hour  Intake 1840.52 ml  Output 900 ml  Net 940.52 ml   Last 3 Weights 05/01/2021 04/30/2021 04/30/2021  Weight (lbs) 322 lb 5 oz 337 lb 337 lb   Weight (kg) 146.2 kg 152.862 kg 152.862 kg      Telemetry    SR 80's, rare PVCs, 2 NSVT, one PM - Personally Reviewed  ECG    SR 76bpm, PACs - Personally Reviewed  Physical Exam   GEN: sedated  Neck: difficult to assess Cardiac: RRR, no murmurs, rubs, or gallops.  Respiratory: intubated, crackles at the bases. GI: Soft, nontender MS: trace if any edema; No deformity. Neuro:  unable to assess Psych: unable to assess   Labs    High Sensitivity Troponin:   Recent Labs  Lab 04/29/21 1247 04/29/21 1659  TROPONINIHS 74* 64*      Chemistry Recent Labs  Lab 04/29/21 1247 04/30/21 0555 04/30/21 1754 04/30/21 1937 05/01/21 0107 05/01/21 0334  NA 137 134*   < > 139 140 139  K 3.6 4.4   < > 4.6 4.5 4.3  CL 106 105  --   --   --  106  CO2 20* 16*  --   --   --  24  GLUCOSE 253* 322*  --   --   --  144*  BUN 43* 44*  --   --   --  54*  CREATININE 1.99* 2.20*  --   --   --  4.65*  CALCIUM 9.3 9.3  --   --   --  9.3  PROT  --  6.8  --   --   --   --   ALBUMIN  --  3.2*  --   --   --   --   AST  --  43*  --   --   --   --   ALT  --  135*  --   --   --   --   ALKPHOS  --  55  --   --   --   --   BILITOT  --  0.7  --   --   --   --   GFRNONAA 41* 36*  --   --   --  15*  ANIONGAP 11 13  --   --   --  9   < > = values in this interval not displayed.     Hematology Recent Labs  Lab 04/29/21 1247 04/30/21 1754 04/30/21 1937 05/01/21 0107  WBC 9.6  --   --   --   RBC 5.43  --   --   --   HGB 15.0 17.3* 17.0 15.3  HCT 45.3 51.0 50.0 45.0  MCV 83.4  --   --   --   MCH 27.6  --   --   --   MCHC 33.1  --   --   --   RDW 18.1*  --   --   --   PLT 221  --   --   --     BNP Recent Labs  Lab 04/29/21 1247  BNP 1,407.7*     DDimer No results for input(s): DDIMER in the last 168 hours.   Radiology    DG Chest 1 View Result Date: 05/01/2021 CLINICAL DATA:  Respiratory failure. EXAM: CHEST  1 VIEW COMPARISON:  04/30/2021. FINDINGS: Endotracheal tube, NG tube,  left IJ line in stable position. AICD in stable position. Stable severe cardiomegaly. Diffuse bilateral pulmonary infiltrates/edema again noted. Slight interim improvement. No pleural effusion or pneumothorax. IMPRESSION: 1.  Lines and tubes in stable position. 2. AICD in stable position. Stable severe cardiomegaly. Diffuse bilateral pulmonary infiltrates/edema again noted with slight interim improvement. Electronically Signed   By: Marcello Moores  Register   On: 05/01/2021 06:41     Cardiac Studies    04/30/21: TTE  1. Left ventricular ejection fraction, by estimation, is <20%. The left  ventricle has severely decreased function. The left ventricle demonstrates  global hypokinesis. The left ventricular internal cavity size was severely  dilated. Left ventricular  diastolic parameters are consistent with Grade III diastolic dysfunction  (restrictive). Elevated left atrial pressure.   2. Right ventricular systolic function is moderately reduced. The right  ventricular size is normal. There is mildly elevated pulmonary artery  systolic pressure. The estimated right ventricular systolic pressure is  37.4 mmHg.   3. Left atrial size was severely dilated.   4. The mitral valve is normal in structure. Trivial mitral valve  regurgitation.   5. The aortic valve is tricuspid. Aortic valve regurgitation is not  visualized. No aortic stenosis is present.   6. The inferior vena cava is dilated in size with <50% respiratory  variability, suggesting right atrial pressure of 15 mmHg.    06/06/2020: TTE IMPRESSIONS   1. Left ventricular ejection fraction, by estimation, is 20 to 25%. The  left ventricle has severely decreased function. The left ventricle  demonstrates global hypokinesis. The left ventricular internal cavity size  was mildly dilated. There is mild left  ventricular hypertrophy. Left ventricular diastolic parameters are  consistent with Grade I diastolic dysfunction (impaired relaxation).   2.  Right ventricular systolic function is normal. The right ventricular  size is normal.   3. Left atrial size was mildly dilated.   4. The mitral valve is normal in structure. Trivial mitral valve  regurgitation. No evidence of mitral stenosis.   5. The aortic valve is normal in structure. Aortic valve regurgitation is  not visualized. No aortic stenosis is present.   6. The inferior vena cava is normal in size with greater than 50%  respiratory variability, suggesting right atrial pressure of 3 mmHg.     05/17/2015: R/LHC Findings:   RA = 7 RV = 42/3/7 PA =  43/30 (39) PCW = 26 Fick cardiac output/index = 4.9/2.1 PVR = 2.7 WU SVR = 1651 FA sat = 95% PA sat = 65%, 65% Ao = 122/97 (109) LV = 110/31/33   Assessment: 1) Normal coronaries 2) Moderately elevated filling pressures with moderately depressed cardiac output    Patient Profile     45 y.o. male with a hx of HTN, renal Ca (s/p  R nephrectomy), CKD (III), DM, OSA, morbid obesity, gout, NICM, chronic CHF (systolic), S-ICD, AFib admitted with AFib RVR and decompensated CHF  Device information [employer] S-ICD implanted 05/17/2015 > gen change 0/93/8182 Gen change complicated by flash pulmonary edema suffered traumatic intubation/aspiration + hx of appropriate shock 2019 for VT   AFib hx Diagnosed 2018 AAD hx Amiodarone started 2018 (also has hx of VT w/shock 2019)  Assessment & Plan      Paroxysmal Afib RVR CHA2DS2Vasc is 3, on eliquis, appropriately dosed Chronically on amiodarone for his AF and VT hx He had missed a couple Eliquis doses of late underwent TEE/DCCV yesterday  Remains on amiodarone gtt, maintaining SR Eliquis >> heparin today Continue amiodarone gtt  2. NSVT Continue amiodarone gtt   3. Acute/chronic CHF 4. Cardiogenic shock 5. Respiratory failure 6. AKI on CKD Yesterday despite DCCV respiratory status did not improve and ultimately required intubation AHF team managing Poss need for CRRT On  levo, starting dobutamine   For questions or updates, please contact [place] Please consult [email or web address] for contact info under        Signed, Baldwin [place], PA-C  05/01/2021, 7:34 AM    I have seen and examined this patient with Tommye Standard.  Agree with above, note added to reflect my findings.  On exam, RRR, no murmurs. DCCV yesterday and remains in sinus rhythm. Unfortunately decompensated yesterday and is now intubated with renal failure on multiple pressors. Would continue amiodarone ggt until his has improved and no longer requiring vasoactive medications.    Obbie Lewallen M. Zakari Bathe MD 05/01/2021 11:12 AM

## 2021-05-01 NOTE — Progress Notes (Signed)
Patient ID: Jeffrey [name], male   DOB: 1976-01-25, 45 y.o.   MRN: [id number]     Advanced Heart Failure Rounding Note  PCP-Cardiologist: Glori Bickers, MD   Subjective:    Worsening respiratory distress and intubated overnight.  He became hypotensive and is now on NE 16.  Co-ox initially >60% but this morning down to 50%.  Lactate 2.4, PCT 0.47.  GPCs in sputum. CXR with pulmonary edema. Creatinine 2.2 => 4.65 with UOP 900 cc.  He remains in NSR on amiodarone gtt. CVP 19-20.   He is awake on vent this morning.   Echo: EF < 20%, severe LV dilation, moderate RV dysfunction   Objective:   Weight Range: (!) 146.2 kg Body mass index is 49.01 kg/m.   Vital Signs:   Temp:  [97.5 F (36.4 C)-99 F (37.2 C)] 98.7 F (37.1 C) (07/27 2300) Pulse Rate:  [57-155] 57 (07/28 0600) Resp:  [17-41] 30 (07/28 0600) BP: (65-165)/(44-125) 84/62 (07/28 0600) SpO2:  [84 %-100 %] 100 % (07/28 0600) FiO2 (%):  [70 %-100 %] 70 % (07/28 0326) Weight:  [146.2 kg-152.9 kg] 146.2 kg (07/28 0456) Last BM Date: 04/29/21  Weight change: Filed Weights   04/30/21 0749 04/30/21 1146 05/01/21 0456  Weight: (!) 152.9 kg (!) 152.9 kg (!) 146.2 kg    Intake/Output:   Intake/Output Summary (Last 24 hours) at 05/01/2021 0740 Last data filed at 05/01/2021 0600 Gross per 24 hour  Intake 1840.52 ml  Output 900 ml  Net 940.52 ml      Physical Exam    General:  Wakes up on vent HEENT: Normal Neck: Supple. JVP 16+. Carotids 2+ bilat; no bruits. No lymphadenopathy or thyromegaly appreciated. Cor: PMI nondisplaced. Regular rate & rhythm. No rubs, gallops or murmurs. Lungs: Coarse BS Abdomen: Soft, nontender, nondistended. No hepatosplenomegaly. No bruits or masses. Good bowel sounds. Extremities: No cyanosis, clubbing, rash. 1+ edema to knees.  Neuro: Alert & orientedx3, cranial nerves grossly intact. moves all 4 extremities w/o difficulty. Affect pleasant   Telemetry   NSR 70s (personally  reviewed)   Labs    CBC Recent Labs    04/29/21 1247 04/30/21 1754 04/30/21 1937 05/01/21 0107  WBC 9.6  --   --   --   HGB 15.0   < > 17.0 15.3  HCT 45.3   < > 50.0 45.0  MCV 83.4  --   --   --   PLT 221  --   --   --    < > = values in this interval not displayed.   Basic Metabolic Panel Recent Labs    04/30/21 0555 04/30/21 1754 05/01/21 0107 05/01/21 0334  NA 134*   < > 140 139  K 4.4   < > 4.5 4.3  CL 105  --   --  106  CO2 16*  --   --  24  GLUCOSE 322*  --   --  144*  BUN 44*  --   --  54*  CREATININE 2.20*  --   --  4.65*  CALCIUM 9.3  --   --  9.3  MG 2.1  --   --  2.3   < > = values in this interval not displayed.   Liver Function Tests Recent Labs    04/30/21 0555  AST 43*  ALT 135*  ALKPHOS 55  BILITOT 0.7  PROT 6.8  ALBUMIN 3.2*   No results for input(s): LIPASE, AMYLASE in the last  72 hours. Cardiac Enzymes No results for input(s): CKTOTAL, CKMB, CKMBINDEX, TROPONINI in the last 72 hours.  BNP: BNP (last 3 results) Recent Labs    11/21/20 2222 04/29/21 1247  BNP 65.0 1,407.7*    ProBNP (last 3 results) No results for input(s): PROBNP in the last 8760 hours.   D-Dimer No results for input(s): DDIMER in the last 72 hours. Hemoglobin A1C Recent Labs    04/30/21 0555  HGBA1C 9.4*   Fasting Lipid Panel Recent Labs    05/01/21 0334  TRIG 182*   Thyroid Function Tests Recent Labs    04/30/21 0555  TSH 2.519    Other results:   Imaging    DG Chest 1 View  Result Date: 05/01/2021 CLINICAL DATA:  Respiratory failure. EXAM: CHEST  1 VIEW COMPARISON:  04/30/2021. FINDINGS: Endotracheal tube, NG tube, left IJ line in stable position. AICD in stable position. Stable severe cardiomegaly. Diffuse bilateral pulmonary infiltrates/edema again noted. Slight interim improvement. No pleural effusion or pneumothorax. IMPRESSION: 1.  Lines and tubes in stable position. 2. AICD in stable position. Stable severe cardiomegaly. Diffuse  bilateral pulmonary infiltrates/edema again noted with slight interim improvement. Electronically Signed   By: Marcello Moores  Register   On: 05/01/2021 06:41   DG CHEST PORT 1 VIEW  Result Date: 04/30/2021 CLINICAL DATA:  Heart failure, intubated EXAM: PORTABLE CHEST 1 VIEW COMPARISON:  Chest radiograph from one day prior. FINDINGS: Endotracheal tube tip is 4.8 cm above the carina. Left internal jugular central venous catheter terminates at the cavoatrial junction. Enteric tube enters stomach with the tip not seen on this image. Stable configuration of single lead left chest ICD. Stable cardiomediastinal silhouette with mild cardiomegaly. No pneumothorax. No pleural effusion. Diffuse fluffy parahilar lung opacities, significantly worsened. IMPRESSION: 1. Well-positioned support structures as detailed. 2. Mild cardiomegaly. Diffuse fluffy parahilar lung opacities, significantly worsened, compatible with flash cardiogenic pulmonary edema. Electronically Signed   By: Ilona Sorrel M.D.   On: 04/30/2021 17:27   ECHOCARDIOGRAM COMPLETE  Result Date: 04/30/2021    ECHOCARDIOGRAM REPORT   Patient Name:   Jeffrey [name] Date of Exam: 04/30/2021 Medical Rec #:  [id number]    Height:       68.0 in Accession #:    [id number]   Weight:       337.0 lb Date of Birth:  10/14/1975    BSA:          2.551 m Patient Age:    45 years     BP:           129/102 mmHg Patient Gender: M            HR:           98 bpm. Exam Location:  Inpatient Procedure: 2D Echo, Cardiac Doppler, Color Doppler and Intracardiac            Opacification Agent Indications:    CHF  History:        Patient has prior history of Echocardiogram examinations, most                 recent 04/30/2021. CHF and Cardiomyopathy, Defibrillator,                 Arrythmias:Atrial Fibrillation and Cardioversion 04/30/21,                 Signs/Symptoms:CKD; Risk Factors:Hypertension, Dyslipidemia,                 Diabetes and Morbid  obesity.  Sonographer:    Dustin Flock  Referring Phys: [place]  1. Left ventricular ejection fraction, by estimation, is <20%. The left ventricle has severely decreased function. The left ventricle demonstrates global hypokinesis. The left ventricular internal cavity size was severely dilated. Left ventricular diastolic parameters are consistent with Grade III diastolic dysfunction (restrictive). Elevated left atrial pressure.  2. Right ventricular systolic function is moderately reduced. The right ventricular size is normal. There is mildly elevated pulmonary artery systolic pressure. The estimated right ventricular systolic pressure is 56.3 mmHg.  3. Left atrial size was severely dilated.  4. The mitral valve is normal in structure. Trivial mitral valve regurgitation.  5. The aortic valve is tricuspid. Aortic valve regurgitation is not visualized. No aortic stenosis is present.  6. The inferior vena cava is dilated in size with <50% respiratory variability, suggesting right atrial pressure of 15 mmHg. FINDINGS  Left Ventricle: Left ventricular ejection fraction, by estimation, is <20%. The left ventricle has severely decreased function. The left ventricle demonstrates global hypokinesis. Definity contrast agent was given IV to delineate the left ventricular endocardial borders. The left ventricular internal cavity size was severely dilated. There is no left ventricular hypertrophy. Left ventricular diastolic parameters are consistent with Grade III diastolic dysfunction (restrictive). Elevated left atrial pressure. Right Ventricle: The right ventricular size is normal. Right vetricular wall thickness was not well visualized. Right ventricular systolic function is moderately reduced. There is mildly elevated pulmonary artery systolic pressure. The tricuspid regurgitant velocity is 2.42 m/s, and with an assumed right atrial pressure of 15 mmHg, the estimated right ventricular systolic pressure is 89.3 mmHg. Left Atrium: Left  atrial size was severely dilated. Right Atrium: Right atrial size was normal in size. Pericardium: Trivial pericardial effusion is present. Mitral Valve: The mitral valve is normal in structure. Trivial mitral valve regurgitation. Tricuspid Valve: The tricuspid valve is normal in structure. Tricuspid valve regurgitation is trivial. Aortic Valve: The aortic valve is tricuspid. Aortic valve regurgitation is not visualized. No aortic stenosis is present. Pulmonic Valve: The pulmonic valve was normal in structure. Pulmonic valve regurgitation is trivial. Aorta: The aortic root is normal in size and structure. Venous: The inferior vena cava is dilated in size with less than 50% respiratory variability, suggesting right atrial pressure of 15 mmHg. IAS/Shunts: The interatrial septum was not well visualized.  LEFT VENTRICLE PLAX 2D LVIDd:         6.90 cm  Diastology LVIDs:         6.20 cm  LV e' medial:    5.66 cm/s LV PW:         1.20 cm  LV E/e' medial:  17.0 LV IVS:        1.30 cm  LV e' lateral:   3.81 cm/s LVOT diam:     2.50 cm  LV E/e' lateral: 25.2 LV SV:         42 LV SV Index:   16 LVOT Area:     4.91 cm  RIGHT VENTRICLE RV Basal diam:  3.60 cm RV S prime:     5.98 cm/s LEFT ATRIUM              Index       RIGHT ATRIUM           Index LA diam:        4.80 cm  1.88 cm/m  RA Area:     22.40 cm LA Vol (A2C):   141.0  ml 55.27 ml/m RA Volume:   71.00 ml  27.83 ml/m LA Vol (A4C):   135.0 ml 52.92 ml/m LA Biplane Vol: 147.0 ml 57.62 ml/m  AORTIC VALVE LVOT Vmax:   64.70 cm/s LVOT Vmean:  41.200 cm/s LVOT VTI:    0.086 m  AORTA Ao Root diam: 3.20 cm MITRAL VALVE               TRICUSPID VALVE MV Area (PHT): 7.44 cm    TR Peak grad:   23.4 mmHg MV Decel Time: 102 msec    TR Vmax:        242.00 cm/s MV E velocity: 96.10 cm/s MV A velocity: 49.20 cm/s  SHUNTS MV E/A ratio:  1.95        Systemic VTI:  0.09 m                            Systemic Diam: 2.50 cm Oswaldo Milian MD Electronically signed by Oswaldo Milian MD Signature Date/Time: 04/30/2021/3:45:52 PM    Final    [place] EKG SITE RITE  Result Date: 04/30/2021 If Site Rite image not attached, placement could not be confirmed due to current cardiac rhythm.    Medications:     Scheduled Medications:  chlorhexidine gluconate (MEDLINE KIT)  15 mL Mouth Rinse BID   Chlorhexidine Gluconate Cloth  6 each Topical Daily   docusate  100 mg Per Tube BID   loratadine  10 mg Per Tube Daily   mouth rinse  15 mL Mouth Rinse 10 times per day   polyethylene glycol  17 g Per Tube Daily   sodium chloride flush  10-40 mL Intracatheter Q12H   sodium chloride flush  3 mL Intravenous Q12H    Infusions:  sodium chloride Stopped (04/30/21 2254)   amiodarone 30 mg/hr (05/01/21 0600)   ceFEPime (MAXIPIME) IV     DOBUTamine     fentaNYL infusion INTRAVENOUS 50 mcg/hr (05/01/21 0600)   furosemide (LASIX) 200 mg in dextrose 5% 100 mL (53m/mL) infusion 12 mg/hr (05/01/21 0600)   heparin     insulin 4.4 Units/hr (05/01/21 0600)   linezolid (ZYVOX) IV     norepinephrine (LEVOPHED) Adult infusion 14 mcg/min (05/01/21 0600)   phenylephrine     propofol (DIPRIVAN) infusion 15 mcg/kg/min (05/01/21 0600)    sodium bicarbonate (isotonic) infusion in sterile water 50 mL/hr at 05/01/21 0600    PRN Medications: sodium chloride, acetaminophen, ALPRAZolam, dextrose, famotidine, fentaNYL, nitroGLYCERIN, ondansetron (ZOFRAN) IV, oxyCODONE, sodium chloride flush, sodium chloride flush, zolpidem  Assessment/Plan   1. Atrial fibrillation: Paroxysmal.  Suspect AF with RVR drove CHF exacerbation.  Patient has been on amiodarone and Eliquis at home, has missed some Eliquis doses. Emergent TEE-guided DCCV 7/27 due to inability to control HR, now back in NSR. - Continue amiodarone gtt. - Transition apixaban to heparin gtt today with critical illness.  - Eventually needs consideration for AF ablation. 2. Acute on chronic systolic CHF/cardiogenic shock: Nonischemic CMP,  exacerbation likely driven by AF.  Echo in 9/21 with EF 20-25%.  Echo this admission with EF < 20%, severe LV dilation, moderate RV dysfunction.  Overnight, intubated with pulmonary edema and required NE up to 14.  Co-ox initially stable, now low this morning at 50%.  CVP 19-20 with AKI.  Lactate 2.4.  - Needs arterial line.  - Continue NE, add dobutamine 5.  Repeat co-ox on dobutamine.  - Will continue Lasix gtt 12 mg/hr  for now with marked volume overload, CVVH if renal function does not turn around.  3. AKI on CKD stage 3: Has solitary kidney.  Suspect cardiorenal syndrome/low output with AF/RVR in setting of cardiomyopathy.  Creatinine 2.2 => 4.65 with CVP 19-20. - Adding dobutamine as above.  - Follow closely, may need CVVH. 4. OSA 5. Acute hypoxemic respiratory failure: Suspect primarily CHF but has GPCs in sputum.  Afebrile.  - Will add PNA coverage with linezolid + cefepime given critical illness.   Discussed with CCM  CRITICAL CARE Performed by: Loralie Champagne  Total critical care time: 40 minutes  Critical care time was exclusive of separately billable procedures and treating other patients.  Critical care was necessary to treat or prevent imminent or life-threatening deterioration.  Critical care was time spent personally by me on the following activities: development of treatment plan with patient and/or surrogate as well as nursing, discussions with consultants, evaluation of patient's response to treatment, examination of patient, obtaining history from patient or surrogate, ordering and performing treatments and interventions, ordering and review of laboratory studies, ordering and review of radiographic studies, pulse oximetry and re-evaluation of patient's condition.   Length of Stay: 1  Loralie Champagne, MD  05/01/2021, 7:40 AM  Advanced Heart Failure Team Pager [phone] (M-F; 7a - 5p)  Please contact [facility] for night-coverage after hours (5p -7a ) and weekends  on [email or web address]

## 2021-05-01 NOTE — Progress Notes (Signed)
Initial Nutrition Assessment  DOCUMENTATION CODES:   Morbid obesity  INTERVENTION:   Tube Feeding via OG: Vital High Protein at 60 ml/hr Pro-Source TF 45 mL TID Provides 160 g of protein, 1560 kcals and 1210 mL of free water   TF regimen and propofol at current rate providing 1916 total kcal/day    NUTRITION DIAGNOSIS:   Inadequate oral intake related to acute illness as evidenced by NPO status.  GOAL:   Patient will meet greater than or equal to 90% of their needs   MONITOR:   Vent status, TF tolerance, Weight trends, Labs  REASON FOR ASSESSMENT:   Consult, Ventilator Enteral/tube feeding initiation and management  ASSESSMENT:   45 yo male admitted with decompensated heart failure in setting of afib, worsening respiratory failure requiring intubation. PMH includes CHF, CKD III, solitary kidney secondary to R renal cell carcinoma  7/26 Admitted 7/27 Cardioversion, worsening resp status requiring intubation  Pt sedated on vent support Propofol: 13.5 ml/hr  OG tube in place per chest xray  Unable to obtain diet and weight history  Labs: reviewed Meds: lasix drip, ss novolog, novolog q 4 hours, levemir  Diet Order:   Diet Order             Diet NPO time specified  Diet effective now                   EDUCATION NEEDS:   Not appropriate for education at this time  Skin:  Skin Assessment: Reviewed RN Assessment  Last BM:  7/26  Height:   Ht Readings from Last 1 Encounters:  05/01/21 '5\' 8"'$  (1.727 m)    Weight:   Wt Readings from Last 1 Encounters:  05/01/21 (!) 146.2 kg     BMI:  Body mass index is 49.01 kg/m.  Estimated Nutritional Needs:   Kcal:  1800-2000 kcals  Protein:  140-175 g  Fluid:  1.8 L  Kerman Passey MS, RDN, LDN, CNSC Registered Dietitian III Clinical Nutrition RD Pager and On-Call Pager Number Located in [place]

## 2021-05-01 NOTE — Progress Notes (Signed)
   NAME:  Jeffrey [name], MRN:  [id number], DOB:  [birth year], LOS: 1 ADMISSION DATE:  04/29/2021, CONSULTATION DATE:  04/30/21 REFERRING MD:  CHF, CHIEF COMPLAINT:  SOB   History of Present Illness:  45 year old man with extensive cardiac hx as below presenting with decompensated heart failure in setting of afib.  Cardioverted earlier today but unforuntately worse respiratory distress.  + orthopnea.  Saturating 80s on NRB and RR 40s, Aox3, anxious.  Pertinent  Medical History  NICM with AICD in place HTN R renal cell carcinoma s/p nephrectomy CKD 3a Hx of traumatic intubation  Significant Hospital Events: Including procedures, antibiotic start and stop dates in addition to other pertinent events   7/26 admitted 7/27 cardioversion, worsening resp distress; intubation  Interim History / Subjective:  Tough to ventilate last night so given bicarb drip Minimal UoP Waking up quite a bit on current sedation level.  Objective   Blood pressure (!) 84/62, pulse (!) 57, temperature 98.7 F (37.1 C), temperature source Oral, resp. rate (!) 30, height '5\' 8"'$  (1.727 m), weight (!) 146.2 kg, SpO2 100 %. CVP:  [12 mmHg-18 mmHg] 16 mmHg  Vent Mode: PRVC FiO2 (%):  [60 %-100 %] 60 % Set Rate:  [20 bmp-30 bmp] 30 bmp Vt Set:  [550 mL] 550 mL PEEP:  [10 cmH20-14 cmH20] 14 cmH20 Plateau Pressure:  [26 cmH20-31 cmH20] 26 cmH20   Intake/Output Summary (Last 24 hours) at 05/01/2021 0802 Last data filed at 05/01/2021 0600 Gross per 24 hour  Intake 1840.52 ml  Output 900 ml  Net 940.52 ml    Filed Weights   04/30/21 0749 04/30/21 1146 05/01/21 0456  Weight: (!) 152.9 kg (!) 152.9 kg (!) 146.2 kg    Examination: No distress, does flail arms occasionally Global anasarca CVP in 20s Lungs diminished bases +S4, ext cool  BUN/Cr rising BNP up Lactate up Echo read pending: looks like < 10% ef to me  Sugars better SvO2 50% on inotropes BMP/Cr rising Weight pending Lactate improved slightly CXR  continued edema vs. Airspace dx  Resolved Hospital Problem list   N/a  Assessment & Plan:  Decompensated HFrEF post post cardioversion hopefully just stunning Afib Cardiogenic shock Question of HCAP, abx on, pending cultures Cardiorenal syndrome, hx of nephrectomy Class 5 obesity OSA on CPAP DM2 with hyperglycemia  - Continue linezolid/cefepime, f/u culture data - Continue vent support, VAP prevention bundle, DC bicarb gtt - Sedation to RASS -3 to -4 - Inotrope and diuretic push today, probably will end up on CRRT tomorrow - Amio per CHF team   Patient critically ill due to respiratory failure, shock Interventions to address this today vent titration, pressor titration Risk of deterioration without these interventions is high  I personally spent 34 minutes providing critical care not including any separately billable procedures  Erskine Emery MD [facility]  Prefer epic messenger for cross cover needs If after hours, please call E-link

## 2021-05-01 NOTE — Progress Notes (Signed)
eLink Physician-Brief Progress Note Patient Name: Jeffrey [name] DOB: 05/17/1976 MRN: [id number]   Date of Service  05/01/2021  HPI/Events of Note  Difficulty replacing Foley catheter today. Foley finally replaced with 20 mL urine output. Bladder scan reveals residual = 1 liter.   eICU Interventions  Plan: Will request PCCM ground team troubleshoot Foley catheter prior to consulting urology.      Intervention Category Major Interventions: Other:  Lysle Dingwall 05/01/2021, 8:27 PM

## 2021-05-01 NOTE — Progress Notes (Addendum)
Called and updated wife. Told her good chance he will need HD by tomorrow if no response today.

## 2021-05-01 NOTE — Procedures (Signed)
Foley Catheter Placement Note  Indications: 45 y.o. male with decompensated heart failure and atrial fibrillation on a ventilator with respiratory distress.  Had a Foley catheter in place and was on a Lasix drip.  Urine output diminished acutely.  Foley catheter was removed and a new 1 was inserted but only 20 cc of bloody urine output.  Bladder scan for over 1 L.  Pre-operative Diagnosis: Urinary retention  Post-operative Diagnosis: Same  Surgeon: Bishop Limbo, MD  Assistants: None  Procedure Details  I interrogated the existing Foley catheter.  It would not flush.  I deflated the balloon and it would not advance.  It was a 85 [place] two-way.  I therefore removed the catheter.  Patient was prepped with Betadine and draped in the usual sterile fashion.  We injected lidocaine jelly per urethra prior to the procedure.  We then inserted a 43 [place] a catheter per urethra which passed into the bladder with significant resistance at the prostatic urethra.  Of note his urethra is long and the.  We achieved return of clear yellow urine and then proceeded to insert 10 mL of sterile water into the Foley balloon.  The catheter was attached to a drainage bag and secured with a StatLock.  Placement of the catheter had return of greater than 1000 mL of clear yellow urine.               Complications: None; patient tolerated the procedure well.  Plan:   1.  Continue Foley catheter to drainage for at least 7 days days due to greater than thousand mL of urine returned with catheter placement and concern for bladder stretch injury.  Would only trial void once patient is no longer sedated, improving with mobilization, recovered from bladder stretch injury.       Attending Attestation: Dr. Junious Silk was available.   Bishop Limbo, MD [facility]

## 2021-05-01 NOTE — Progress Notes (Signed)
ANTICOAGULATION CONSULT NOTE - Initial Consult  Pharmacy Consult for heparin Indication: atrial fibrillation  Allergies  Allergen Reactions   Aspirin Shortness Of Breath, Itching and Rash     Burning sensation (Patient reports he tolerates other NSAIDS)    Bee Venom Hives and Swelling   Lisinopril Cough   Tomato Rash    Patient Measurements: Height: '5\' 8"'$  (172.7 cm) Weight: (!) 146.2 kg (322 lb 5 oz) IBW/kg (Calculated) : 68.4 Heparin Dosing Weight: 103kg  Vital Signs: Temp: 98 F (36.7 C) (07/28 0748) Temp Source: Axillary (07/28 0748) BP: 74/58 (07/28 0750) Pulse Rate: 80 (07/28 0800)  Labs: Recent Labs    04/29/21 1247 04/29/21 1659 04/30/21 0555 04/30/21 1754 04/30/21 1937 05/01/21 0107 05/01/21 0334  HGB 15.0  --   --  17.3* 17.0 15.3  --   HCT 45.3  --   --  51.0 50.0 45.0  --   PLT 221  --   --   --   --   --   --   CREATININE 1.99*  --  2.20*  --   --   --  4.65*  TROPONINIHS 74* 64*  --   --   --   --   --     Estimated Creatinine Clearance: 27.9 mL/min (A) (by C-G formula based on SCr of 4.65 mg/dL (H)).   Medical History: Past Medical History:  Diagnosis Date   Anxiety    Aspirin allergy    Childhood asthma    Chronic systolic CHF (congestive heart failure) ([place])    a. EF 20-25% in 2012. b. EF 45-50% in 10/2011 with nonischemic nuc - presumed NICM. c. 12/2014 Echo: Sev depressed LV fxn, sev dil LV, mild LVH, mild MR, sev dil LA, mildly reduced RV fxn.   CKD (chronic kidney disease) stage 2, GFR 60-89 ml/min    H/O vasectomy 12/2019   High cholesterol    Hypertension    Morbid obesity ([place])    Nephrolithiasis    OSA on CPAP    Paroxysmal atrial fibrillation ([place])    Presumed NICM    a. 04/2014 Myoview: EF 26%, glob HK, sev glob HK, ? prior infarct;  b. Never cathed 2/2 CKD.   Renal cell carcinoma ([place])    a. s/p Rt robotic assisted partial converted to radical nephrectomy on 01/2013.   Troponin level elevated    a. 04/2014, 12/2014: felt due to  CHF.   Type II diabetes mellitus (HCC)    Ventricular tachycardia (HCC)    a. appropriate ICD therapy 12/2017    Assessment: 45 year old male s/p emergent cardioversion due to hemodynamic instability yesterday. Patient was anticoagulated with apixaban. Given multiorgan failure will transition patient over to heparin. Will need to be aggressive with dosing given recent cardioversion. CBC appears stable overnight. Given recent apixaban use will monitor and dose adjust based on aptts until they correlate with heparin levels.   Goal of Therapy:  Heparin level 0.3-0.7 units/ml Monitor platelets by anticoagulation protocol: Yes   Plan:  Start heparin infusion at 1500 units/hr Check anti-Xa level in 8 hours and daily while on heparin Continue to monitor H&H and platelets  Erin Hearing PharmD., BCPS Clinical Pharmacist 05/01/2021 11:28 AM

## 2021-05-01 NOTE — Procedures (Signed)
Arterial Catheter Insertion Procedure Note  STEPHANOS TIET  [id number]  May 09, 1976  Date:05/01/21  Time:9:28 AM    Provider Performing: Candee Furbish    Procedure: Insertion of Arterial Line [id number]) with US guidance [id number])   Indication(s) Blood pressure monitoring and/or need for frequent ABGs  Consent Risks of the procedure as well as the alternatives and risks of each were explained to the patient and/or caregiver.  Consent for the procedure was obtained and is signed in the bedside chart  Anesthesia None   Time Out Verified patient identification, verified procedure, site/side was marked, verified correct patient position, special equipment/implants available, medications/allergies/relevant history reviewed, required imaging and test results available.   Sterile Technique Maximal sterile technique including full sterile barrier drape, hand hygiene, sterile gown, sterile gloves, mask, hair covering, sterile ultrasound probe cover (if used).   Procedure Description Area of catheter insertion was cleaned with chlorhexidine and draped in sterile fashion. With real-time ultrasound guidance an arterial catheter was placed into the right radial artery.  Appropriate arterial tracings confirmed on monitor.     Complications/Tolerance None; patient tolerated the procedure well.   EBL Minimal   Specimen(s) None

## 2021-05-02 DIAGNOSIS — I472 Ventricular tachycardia: Secondary | ICD-10-CM | POA: Diagnosis not present

## 2021-05-02 DIAGNOSIS — J9601 Acute respiratory failure with hypoxia: Secondary | ICD-10-CM | POA: Diagnosis not present

## 2021-05-02 DIAGNOSIS — R079 Chest pain, unspecified: Secondary | ICD-10-CM | POA: Diagnosis not present

## 2021-05-02 DIAGNOSIS — R338 Other retention of urine: Secondary | ICD-10-CM | POA: Diagnosis not present

## 2021-05-02 DIAGNOSIS — J9602 Acute respiratory failure with hypercapnia: Secondary | ICD-10-CM | POA: Diagnosis not present

## 2021-05-02 DIAGNOSIS — R570 Cardiogenic shock: Secondary | ICD-10-CM | POA: Diagnosis not present

## 2021-05-02 DIAGNOSIS — I4891 Unspecified atrial fibrillation: Secondary | ICD-10-CM | POA: Diagnosis not present

## 2021-05-02 LAB — RENAL FUNCTION PANEL
Albumin: 2.4 g/dL — ABNORMAL LOW (ref 3.5–5.0)
Anion gap: 14 (ref 5–15)
BUN: 60 mg/dL — ABNORMAL HIGH (ref 6–20)
CO2: 21 mmol/L — ABNORMAL LOW (ref 22–32)
Calcium: 9.4 mg/dL (ref 8.9–10.3)
Chloride: 100 mmol/L (ref 98–111)
Creatinine, Ser: 4.58 mg/dL — ABNORMAL HIGH (ref 0.61–1.24)
GFR, Estimated: 15 mL/min — ABNORMAL LOW (ref >60)
Glucose, Bld: 295 mg/dL — ABNORMAL HIGH (ref 70–99)
Phosphorus: 4.7 mg/dL — ABNORMAL HIGH (ref 2.5–4.6)
Potassium: 3.3 mmol/L — ABNORMAL LOW (ref 3.5–5.1)
Sodium: 135 mmol/L (ref 135–145)

## 2021-05-02 LAB — BASIC METABOLIC PANEL
Anion gap: 12 (ref 5–15)
Anion gap: 11 (ref 5–15)
BUN: 63 mg/dL — ABNORMAL HIGH (ref 6–20)
BUN: 58 mg/dL — ABNORMAL HIGH (ref 6–20)
CO2: 21 mmol/L — ABNORMAL LOW (ref 22–32)
CO2: 24 mmol/L (ref 22–32)
Calcium: 9.3 mg/dL (ref 8.9–10.3)
Calcium: 9.4 mg/dL (ref 8.9–10.3)
Chloride: 103 mmol/L (ref 98–111)
Chloride: 106 mmol/L (ref 98–111)
Creatinine, Ser: 4.57 mg/dL — ABNORMAL HIGH (ref 0.61–1.24)
Creatinine, Ser: 3.6 mg/dL — ABNORMAL HIGH (ref 0.61–1.24)
GFR, Estimated: 15 mL/min — ABNORMAL LOW (ref >60)
GFR, Estimated: 20 mL/min — ABNORMAL LOW (ref >60)
Glucose, Bld: 292 mg/dL — ABNORMAL HIGH (ref 70–99)
Glucose, Bld: 196 mg/dL — ABNORMAL HIGH (ref 70–99)
Potassium: 3.4 mmol/L — ABNORMAL LOW (ref 3.5–5.1)
Potassium: 3.5 mmol/L (ref 3.5–5.1)
Sodium: 136 mmol/L (ref 135–145)
Sodium: 141 mmol/L (ref 135–145)

## 2021-05-02 LAB — HEPATIC FUNCTION PANEL
ALT: 61 U/L — ABNORMAL HIGH (ref 0–44)
AST: 19 U/L (ref 15–41)
Albumin: 2.4 g/dL — ABNORMAL LOW (ref 3.5–5.0)
Alkaline Phosphatase: 42 U/L (ref 38–126)
Bilirubin, Direct: 0.3 mg/dL — ABNORMAL HIGH (ref 0.0–0.2)
Indirect Bilirubin: 0.9 mg/dL (ref 0.3–0.9)
Total Bilirubin: 1.2 mg/dL (ref 0.3–1.2)
Total Protein: 5.9 g/dL — ABNORMAL LOW (ref 6.5–8.1)

## 2021-05-02 LAB — GLUCOSE, CAPILLARY
Glucose-Capillary: 181 mg/dL — ABNORMAL HIGH (ref 70–99)
Glucose-Capillary: 187 mg/dL — ABNORMAL HIGH (ref 70–99)
Glucose-Capillary: 193 mg/dL — ABNORMAL HIGH (ref 70–99)
Glucose-Capillary: 200 mg/dL — ABNORMAL HIGH (ref 70–99)
Glucose-Capillary: 205 mg/dL — ABNORMAL HIGH (ref 70–99)
Glucose-Capillary: 224 mg/dL — ABNORMAL HIGH (ref 70–99)
Glucose-Capillary: 247 mg/dL — ABNORMAL HIGH (ref 70–99)
Glucose-Capillary: 345 mg/dL — ABNORMAL HIGH (ref 70–99)

## 2021-05-02 LAB — CBC
HCT: 40.7 % (ref 39.0–52.0)
Hemoglobin: 13.9 g/dL (ref 13.0–17.0)
MCH: 27.9 pg (ref 26.0–34.0)
MCHC: 34.2 g/dL (ref 30.0–36.0)
MCV: 81.6 fL (ref 80.0–100.0)
Platelets: 165 10*3/uL (ref 150–400)
RBC: 4.99 MIL/uL (ref 4.22–5.81)
RDW: 17.5 % — ABNORMAL HIGH (ref 11.5–15.5)
WBC: 10.9 10*3/uL — ABNORMAL HIGH (ref 4.0–10.5)
nRBC: 0 % (ref 0.0–0.2)

## 2021-05-02 LAB — CULTURE, RESPIRATORY W GRAM STAIN: Culture: NORMAL

## 2021-05-02 LAB — COOXEMETRY PANEL
Carboxyhemoglobin: 0.8 % (ref 0.5–1.5)
Methemoglobin: 1.1 % (ref 0.0–1.5)
O2 Saturation: 82.7 %
Total hemoglobin: 13.2 g/dL (ref 12.0–16.0)

## 2021-05-02 LAB — APTT
aPTT: 50 seconds — ABNORMAL HIGH (ref 24–36)
aPTT: 50 seconds — ABNORMAL HIGH (ref 24–36)
aPTT: 52 seconds — ABNORMAL HIGH (ref 24–36)

## 2021-05-02 MED ORDER — INSULIN DETEMIR 100 UNIT/ML ~~LOC~~ SOLN
50.0000 [IU] | Freq: Two times a day (BID) | SUBCUTANEOUS | Status: DC
  Administered 2021-05-02 – 2021-05-22 (×41): 50 [IU] via SUBCUTANEOUS
  Filled 2021-05-02 (×45): qty 0.5

## 2021-05-02 MED ORDER — POTASSIUM CHLORIDE 20 MEQ PO PACK: 40.0000 meq | PACK | Freq: Once | ORAL | Status: DC

## 2021-05-02 MED ORDER — POTASSIUM CHLORIDE CRYS ER 20 MEQ PO TBCR
40.0000 meq | EXTENDED_RELEASE_TABLET | Freq: Four times a day (QID) | ORAL | Status: AC
  Administered 2021-05-02 (×2): 40 meq via ORAL
  Filled 2021-05-02 (×2): qty 2

## 2021-05-02 MED ORDER — SODIUM CHLORIDE 0.9 % IV SOLN
2.0000 g | INTRAVENOUS | Status: DC
  Administered 2021-05-02: 2 g via INTRAVENOUS
  Filled 2021-05-02 (×2): qty 20

## 2021-05-02 MED ORDER — POTASSIUM CHLORIDE 20 MEQ PO PACK
40.0000 meq | PACK | Freq: Four times a day (QID) | ORAL | Status: AC
Start: 2021-05-02 — End: 2021-05-03
  Administered 2021-05-02 – 2021-05-03 (×2): 40 meq
  Filled 2021-05-02 (×2): qty 2

## 2021-05-02 MED ORDER — CHLORHEXIDINE GLUCONATE 0.12 % MT SOLN
OROMUCOSAL | Status: AC
  Filled 2021-05-02: qty 15

## 2021-05-02 NOTE — Progress Notes (Signed)
ANTICOAGULATION CONSULT NOTE - Follow up [facility] for heparin Indication: atrial fibrillation  Allergies  Allergen Reactions   Aspirin Shortness Of Breath, Itching and Rash     Burning sensation (Patient reports he tolerates other NSAIDS)    Bee Venom Hives and Swelling   Lisinopril Cough   Tomato Rash    Patient Measurements: Height: '5\' 8"'$  (172.7 cm) Weight: (!) 146.1 kg (322 lb 1.5 oz) IBW/kg (Calculated) : 68.4 Heparin Dosing Weight: 103kg  Vital Signs: Temp: 98.8 F (37.1 C) (07/29 1545) Temp Source: Oral (07/29 1545) BP: 108/69 (07/29 1800) Pulse Rate: 67 (07/29 1800)  Labs: Recent Labs    04/30/21 1937 05/01/21 0107 05/01/21 0334 05/01/21 1356 05/01/21 1655 05/01/21 1655 05/02/21 0245 05/02/21 0306 05/02/21 1248 05/02/21 1652  HGB 17.0 15.3  --   --   --   --  13.9  --   --   --   HCT 50.0 45.0  --   --   --   --  40.7  --   --   --   PLT  --   --   --   --   --   --  165  --   --   --   APTT  --   --   --   --  69*   < >  --  50* 50* 52*  HEPARINUNFRC  --   --   --   --  >1.10*  --   --   --   --   --   CREATININE  --   --    < > 4.57*  --   --  4.57*  4.58*  --   --  3.60*   < > = values in this interval not displayed.     Estimated Creatinine Clearance: 36.1 mL/min (A) (by C-G formula based on SCr of 3.6 mg/dL (H)).   Medical History: Past Medical History:  Diagnosis Date   Anxiety    Aspirin allergy    Childhood asthma    Chronic systolic CHF (congestive heart failure) ([place])    a. EF 20-25% in 2012. b. EF 45-50% in 10/2011 with nonischemic nuc - presumed NICM. c. 12/2014 Echo: Sev depressed LV fxn, sev dil LV, mild LVH, mild MR, sev dil LA, mildly reduced RV fxn.   CKD (chronic kidney disease) stage 2, GFR 60-89 ml/min    H/O vasectomy 12/2019   High cholesterol    Hypertension    Morbid obesity ([place])    Nephrolithiasis    OSA on CPAP    Paroxysmal atrial fibrillation ([place])    Presumed NICM    a. 04/2014 Myoview: EF 26%,  glob HK, sev glob HK, ? prior infarct;  b. Never cathed 2/2 CKD.   Renal cell carcinoma ([place])    a. s/p Rt robotic assisted partial converted to radical nephrectomy on 01/2013.   Troponin level elevated    a. 04/2014, 12/2014: felt due to CHF.   Type II diabetes mellitus (HCC)    Ventricular tachycardia (HCC)    a. appropriate ICD therapy 12/2017    Assessment: 46 year old male s/p emergent TEE/cardioversion due to hemodynamic instability 7/27. Patient was anticoagulated with apixaban. Given multiorgan failure will transition patient over to heparin. Will need to be aggressive with dosing given recent cardioversion.   Aptt low this morning at 50,Heparin drip 1800 uts/hr aptt still low 52sec  Hgb is down slightly from 15>13, plt  ok 165. No bleeding noted.   Goal of Therapy:  Heparin level 0.3-0.7 units/ml Aptt 66-103 sec Monitor platelets by anticoagulation protocol: Yes   Plan:  Increase Heparin drip to 2050 uts/hr  Daily aptt/heparin level Monitor CBC and s/s bleeding    Bonnita Nasuti Pharm.D. CPP, BCPS Clinical Pharmacist [phone] 05/02/2021 7:08 PM

## 2021-05-02 NOTE — TOC Progression Note (Signed)
Transition of Care (TOC) - Progression Note  Heart Failure   Patient Details  Name: Jeffrey [name] MRN: [id number] Date of Birth: [birth year]  Transition of Care [facility]) CM/SW [place], [place] Phone Number: 05/02/2021, 3:38 PM  Clinical Narrative:    CSW spoke with the patient's spouse [name] as the patient is intubated/sedated. CSW completed a very brief SDOH with the patient's wife who reported that her husband has applied for disability several times but has been denied. He does have Medicaid for health coverage. [name] reported that she is working on Research scientist (physical sciences). [name] indicated that they have one vehicle they share between them and sometimes transportation is a concern when she goes to work. Merrily Brittle have 2 children a 83 year old and a 72 year old. CSW will need [name] signature for the disability application and will have to wait until the patient is no longer intubated/sedated.  CSW will continue to follow throughout discharge.   Expected Discharge Plan: Home/Self Care Barriers to Discharge: Continued Medical Work up  Expected Discharge Plan and Services Expected Discharge Plan: Home/Self Care In-house Referral: Clinical Social Work Discharge Planning Services: CM Consult   Living arrangements for the past 2 months: Single Family Home                                       Social Determinants of Health (SDOH) Interventions Food Insecurity Interventions: Assist with [employer] Application Financial Strain Interventions: Other (Comment) (Referral to [facility] for Disability and Food Stamps) Housing Interventions: Intervention Not Indicated Transportation Interventions: [employer]  Readmission Risk Interventions No flowsheet data found.  Walsie Smeltz, MSW, [place] [facility] Social Worker

## 2021-05-02 NOTE — Progress Notes (Addendum)
NAME:  Jeffrey [name], MRN:  [id number], DOB:  [birth year], LOS: 2 ADMISSION DATE:  04/29/2021, CONSULTATION DATE:  04/30/21 REFERRING MD:  CHF, CHIEF COMPLAINT:  SOB   History of Present Illness:  45 year old man with extensive cardiac hx as below presenting with decompensated heart failure in setting of afib.  Cardioverted earlier today but unforuntately worse respiratory distress.  + orthopnea.  Saturating 80s on NRB and RR 40s, Aox3, anxious.  Pertinent  Medical History  NICM with AICD in place HTN R renal cell carcinoma s/p nephrectomy CKD 3a Hx of traumatic intubation  Significant Hospital Events: Including procedures, antibiotic start and stop dates in addition to other pertinent events   7/26 admitted. 7/27 cardioversion, worsening resp distress; intubation. [id number] foley complication, new catheter placed by urology with good output. 7/29 Continued urine output and creatinine plateau  Interim History / Subjective:  Vent weaned down overnight. Pressors weaning. Lasix drip making 700cc of urine this morning. Did not tolerate SBT, hypoxic.   Objective   Blood pressure (!) 107/58, pulse 83, temperature 98.4 F (36.9 C), temperature source Axillary, resp. rate (!) 30, height '5\' 8"'$  (1.727 m), weight (!) 146.1 kg, SpO2 96 %. CVP:  [9 mmHg-12 mmHg] 10 mmHg  Vent Mode: PRVC FiO2 (%):  [40 %-50 %] 40 % Set Rate:  [30 bmp] 30 bmp Vt Set:  [550 mL] 550 mL PEEP:  [6 cmH20-14 cmH20] 6 cmH20 Plateau Pressure:  [19 cmH20-25 cmH20] 22 cmH20   Intake/Output Summary (Last 24 hours) at 05/02/2021 0953 Last data filed at 05/02/2021 0947 Gross per 24 hour  Intake 4836.65 ml  Output 3270 ml  Net 1566.65 ml    Filed Weights   04/30/21 1146 05/01/21 0456 05/02/21 0120  Weight: (!) 152.9 kg (!) 146.2 kg (!) 146.1 kg    Examination: General:  obese middle aged male on vent.  Neuro:  [place] HEENT:  [place]/AT, PERRL, ETT Cardiovascular:  RRR, no MRG. Trace lower extremity edema.  Lungs:  Clear  bilateral breath sounds. Synchronous with vent.  Abdomen:  Soft, non-distended Musculoskeletal:  No acute deformity Skin:  Intact, MMM   Echo with EF less than 20% (global HK) and grade 3 DD.  RV systolic function mod reduced.  Capillary glucose 250 K  3.4 Creatinine 4.58 >4.57 Urine output picking up 3L out 7/28 >7/29, 767m out already this AM   Resolved Hospital Problem list   N/a  Assessment & Plan:  Decompensated HFrEF post cardioversion hopefully just stunning Afib Cardiogenic shock - heart failure service managing - continue dobutamine - Wean NE as tolerated for MAP 65.  - amiodarone infusion - Lasix infusion  Acute hypoxemic respiratory failure Question of HCAP, abx on, pending cultures - Minimal vent setting, but had setback while on SBT.  - Now 50% 8 PEEP.  - Wean FiO2 as tolerated keep 8 PEEP due to body habitus.  Sat goal 90-90% - Propofol, Fentanyl for RASS goal -1 to -2.  - Narrowed to ceftriaxone. Sputum culture negative. - With good volume off hopefully can have meaningful SBT tomorrow.   Cardiorenal syndrome, hx of nephrectomy Acute on stage 3 CKD: likely due to low flow state AF RVR and bladder outlet obstruction now resolved.  - Lasix infusion - UOP picking up - Hopefully can avoid HD - Gentle K replacement.  - urology placed new foley 7/28.   Class 5 obesity - supportive care while on vent - Tube feeds  DM2 with hyperglycemia - Continue vent  support, VAP prevention bundle, DC bicarb gtt  Critical care time: 38 minutes.   Georgann Housekeeper, AGACNP-BC [facility]  See Amion for personal pager PCCM on call pager [phone] until 7pm. Please call Elink 7p-7a. [phone]  05/02/2021 10:13 AM

## 2021-05-02 NOTE — Plan of Care (Signed)
  Problem: Health Behavior/Discharge Planning: Goal: Ability to manage health-related needs will improve Outcome: Progressing   Problem: Clinical Measurements: Goal: Respiratory complications will improve Outcome: Progressing   Problem: Nutrition: Goal: Adequate nutrition will be maintained Outcome: Progressing   Problem: Coping: Goal: Level of anxiety will decrease Outcome: Progressing   Problem: Elimination: Goal: Will not experience complications related to urinary retention Outcome: Progressing   Problem: Skin Integrity: Goal: Risk for impaired skin integrity will decrease Outcome: Progressing

## 2021-05-02 NOTE — Progress Notes (Signed)
Patient ID: Jeffrey [name], male   DOB: 1975/10/27, 45 y.o.   MRN: [id number] P    Advanced Heart Failure Rounding Note  PCP-Cardiologist: Glori Bickers, MD   Subjective:    Creatinine 4.65 => 4.57.  Starting to make urine this morning, had foley placed by urology last night due to urinary obstruction.    Co-ox 83%, CVP 12 on my read.  He remains on dobutamine 5 + NE 8.  MAP stable.   He is in NSR with PACs, on amiodarone 60 currently.   Echo: EF < 20%, severe LV dilation, moderate RV dysfunction   Objective:   Weight Range: (!) 146.1 kg Body mass index is 48.97 kg/m.   Vital Signs:   Temp:  [97.9 F (36.6 C)-99.3 F (37.4 C)] 98.1 F (36.7 C) (07/29 0400) Pulse Rate:  [59-91] 65 (07/29 0600) Resp:  [17-31] 30 (07/29 0600) BP: (85-116)/(58-80) 106/60 (07/29 0600) SpO2:  [92 %-100 %] 98 % (07/29 0600) Arterial Line BP: (100-148)/(55-94) 103/55 (07/29 0600) FiO2 (%):  [50 %] 50 % (07/29 0316) Weight:  [146.1 kg] 146.1 kg (07/29 0120) Last BM Date: 04/29/21  Weight change: Filed Weights   04/30/21 1146 05/01/21 0456 05/02/21 0120  Weight: (!) 152.9 kg (!) 146.2 kg (!) 146.1 kg    Intake/Output:   Intake/Output Summary (Last 24 hours) at 05/02/2021 0750 Last data filed at 05/02/2021 0600 Gross per 24 hour  Intake 4622.34 ml  Output 2570 ml  Net 2052.34 ml      Physical Exam    General: NAD, intubated/sedated Neck: Thick, JVP difficult, no thyromegaly or thyroid nodule.  Lungs: Clear to auscultation bilaterally with normal respiratory effort. CV: Nonpalpable PMI.  Heart regular S1/S2, no S3/S4, no murmur.  1+ ankle edema.  Abdomen: Soft, nontender, no hepatosplenomegaly, no distention.  Skin: Intact without lesions or rashes.  Neurologic: Sedated on vent.  Extremities: No clubbing or cyanosis.  HEENT: Normal.    Telemetry   NSR 70s (personally reviewed)   Labs    CBC Recent Labs    04/29/21 1247 04/30/21 1754 05/01/21 0107 05/02/21 0245  WBC  9.6  --   --  10.9*  HGB 15.0   < > 15.3 13.9  HCT 45.3   < > 45.0 40.7  MCV 83.4  --   --  81.6  PLT 221  --   --  165   < > = values in this interval not displayed.   Basic Metabolic Panel Recent Labs    04/30/21 0555 04/30/21 1754 05/01/21 0334 05/01/21 1356 05/02/21 0245  NA 134*   < > 139 139 136  135  K 4.4   < > 4.3 3.7 3.4*  3.3*  CL 105  --  106 105 103  100  CO2 16*  --  24 19* 21*  21*  GLUCOSE 322*  --  144* 216* 292*  295*  BUN 44*  --  54* 55* 63*  60*  CREATININE 2.20*  --  4.65* 4.57* 4.57*  4.58*  CALCIUM 9.3  --  9.3 9.3 9.3  9.4  MG 2.1  --  2.3  --   --   PHOS  --   --   --   --  4.7*   < > = values in this interval not displayed.   Liver Function Tests Recent Labs    04/30/21 0555 05/02/21 0245  AST 43* 19  ALT 135* 61*  ALKPHOS 55 42  BILITOT 0.7  1.2  PROT 6.8 5.9*  ALBUMIN 3.2* 2.4*  2.4*   No results for input(s): LIPASE, AMYLASE in the last 72 hours. Cardiac Enzymes No results for input(s): CKTOTAL, CKMB, CKMBINDEX, TROPONINI in the last 72 hours.  BNP: BNP (last 3 results) Recent Labs    11/21/20 2222 04/29/21 1247  BNP 65.0 1,407.7*    ProBNP (last 3 results) No results for input(s): PROBNP in the last 8760 hours.   D-Dimer No results for input(s): DDIMER in the last 72 hours. Hemoglobin A1C Recent Labs    04/30/21 0555  HGBA1C 9.4*   Fasting Lipid Panel Recent Labs    05/01/21 0334  TRIG 182*   Thyroid Function Tests Recent Labs    04/30/21 0555  TSH 2.519    Other results:   Imaging    No results found.   Medications:     Scheduled Medications:  ALPRAZolam  0.5 mg Per Tube BID   chlorhexidine gluconate (MEDLINE KIT)  15 mL Mouth Rinse BID   Chlorhexidine Gluconate Cloth  6 each Topical Daily   docusate  100 mg Per Tube BID   feeding supplement (PROSource TF)  45 mL Per Tube TID   feeding supplement (VITAL HIGH PROTEIN)  1,000 mL Per Tube Q24H   insulin aspart  0-20 Units Subcutaneous  Q4H   insulin aspart  4 Units Subcutaneous Q4H   insulin detemir  40 Units Subcutaneous BID   mouth rinse  15 mL Mouth Rinse 10 times per day   pantoprazole sodium  40 mg Per Tube Daily   polyethylene glycol  17 g Per Tube Daily   sodium chloride flush  10-40 mL Intracatheter Q12H   sodium chloride flush  3 mL Intravenous Q12H    Infusions:  sodium chloride Stopped (04/30/21 2254)   amiodarone 60 mg/hr (05/02/21 0600)   ceFEPime (MAXIPIME) IV Stopped (05/01/21 2310)   DOBUTamine 5 mcg/kg/min (05/02/21 0600)   fentaNYL infusion INTRAVENOUS 200 mcg/hr (05/02/21 0600)   furosemide (LASIX) 200 mg in dextrose 5% 100 mL (58m/mL) infusion 12 mg/hr (05/02/21 0600)   heparin 1,500 Units/hr (05/02/21 0600)   linezolid (ZYVOX) IV Stopped (05/02/21 0034)   norepinephrine (LEVOPHED) Adult infusion 8 mcg/min (05/02/21 0600)   propofol (DIPRIVAN) infusion 40 mcg/kg/min (05/02/21 0600)    sodium bicarbonate (isotonic) infusion in sterile water Stopped (05/01/21 1125)    PRN Medications: sodium chloride, fentaNYL, nitroGLYCERIN, ondansetron (ZOFRAN) IV, sodium chloride flush, sodium chloride flush  Assessment/Plan   1. Atrial fibrillation: Paroxysmal.  Suspect AF with RVR drove CHF exacerbation.  Patient has been on amiodarone and Eliquis at home, has missed some Eliquis doses. Emergent TEE-guided DCCV 7/27 due to inability to control HR, now back in NSR. - Continue amiodarone gtt. - Continue heparin gtt.  - Eventually needs consideration for AF ablation. 2. Acute on chronic systolic CHF/cardiogenic shock: Nonischemic CMP, exacerbation likely driven by AF.  Echo in 9/21 with EF 20-25%.  Echo this admission with EF < 20%, severe LV dilation, moderate RV dysfunction.  Overnight on 7/27 intubated with pulmonary edema and required addition of NE up to 14.  He is now on dobutamine 5 + NE 8, co-ox 82% with CVP down to 12. Now starting to make urine.  Creatinine mildly lower 4.65 => 4.57.  - Continue  dobutamine 5, titrate down NE as BP tolerates.   - Will continue Lasix gtt 12 mg/hr now that UOP seems to be picking up.  3. AKI on CKD stage 3: Has solitary  kidney.  Suspect cardiorenal syndrome/low output with AF/RVR in setting of cardiomyopathy.  Creatinine 2.2 => 4.65 => 4.57.  Urinary obstruction likely played a role, doing better now that urology inserted foley catheter.  - Continue dobutamine to maintain CO.  4. OSA 5. Acute hypoxemic respiratory failure: Suspect primarily CHF but has GPCs in sputum.  Afebrile.  - Has PNA coverage with linezolid + cefepime given critical illness.    CRITICAL CARE Performed by: Loralie Champagne  Total critical care time: 40 minutes  Critical care time was exclusive of separately billable procedures and treating other patients.  Critical care was necessary to treat or prevent imminent or life-threatening deterioration.  Critical care was time spent personally by me on the following activities: development of treatment plan with patient and/or surrogate as well as nursing, discussions with consultants, evaluation of patient's response to treatment, examination of patient, obtaining history from patient or surrogate, ordering and performing treatments and interventions, ordering and review of laboratory studies, ordering and review of radiographic studies, pulse oximetry and re-evaluation of patient's condition.   Length of Stay: 2  Loralie Champagne, MD  05/02/2021, 7:50 AM  Advanced Heart Failure Team Pager [phone] (M-F; 7a - 5p)  Please contact [facility] for night-coverage after hours (5p -7a ) and weekends on [email or web address]

## 2021-05-02 NOTE — Progress Notes (Addendum)
Progress Note  Patient Name: Jeffrey [name] Date of Encounter: 05/02/2021  [facility] Cardiologist: Glori Bickers, MD   Subjective   Intubated and sedated  Inpatient Medications    Scheduled Meds:  ALPRAZolam  0.5 mg Per Tube BID   chlorhexidine gluconate (MEDLINE KIT)  15 mL Mouth Rinse BID   Chlorhexidine Gluconate Cloth  6 each Topical Daily   docusate  100 mg Per Tube BID   feeding supplement (PROSource TF)  45 mL Per Tube TID   feeding supplement (VITAL HIGH PROTEIN)  1,000 mL Per Tube Q24H   insulin aspart  0-20 Units Subcutaneous Q4H   insulin aspart  4 Units Subcutaneous Q4H   insulin detemir  40 Units Subcutaneous BID   mouth rinse  15 mL Mouth Rinse 10 times per day   pantoprazole sodium  40 mg Per Tube Daily   polyethylene glycol  17 g Per Tube Daily   sodium chloride flush  10-40 mL Intracatheter Q12H   sodium chloride flush  3 mL Intravenous Q12H   Continuous Infusions:  sodium chloride Stopped (04/30/21 2254)   amiodarone 60 mg/hr (05/02/21 0600)   ceFEPime (MAXIPIME) IV Stopped (05/01/21 2310)   DOBUTamine 5 mcg/kg/min (05/02/21 0600)   fentaNYL infusion INTRAVENOUS 200 mcg/hr (05/02/21 0600)   furosemide (LASIX) 200 mg in dextrose 5% 100 mL (74m/mL) infusion 12 mg/hr (05/02/21 0600)   heparin 1,500 Units/hr (05/02/21 0600)   linezolid (ZYVOX) IV Stopped (05/02/21 0034)   norepinephrine (LEVOPHED) Adult infusion 8 mcg/min (05/02/21 0600)   propofol (DIPRIVAN) infusion 40 mcg/kg/min (05/02/21 0600)    sodium bicarbonate (isotonic) infusion in sterile water Stopped (05/01/21 1125)   PRN Meds: sodium chloride, fentaNYL, nitroGLYCERIN, ondansetron (ZOFRAN) IV, sodium chloride flush, sodium chloride flush   Vital Signs    Vitals:   05/02/21 0300 05/02/21 0400 05/02/21 0500 05/02/21 0600  BP:  105/72  106/60  Pulse: 67 76 63 65  Resp: (!) 27 (!) 30 (!) 30 (!) 30  Temp:  98.1 F (36.7 C)    TempSrc:  Oral    SpO2: 98% 99% 99% 98%  Weight:       Height:        Intake/Output Summary (Last 24 hours) at 05/02/2021 0707 Last data filed at 05/02/2021 0600 Gross per 24 hour  Intake 4622.34 ml  Output 2570 ml  Net 2052.34 ml   Last 3 Weights 05/02/2021 05/01/2021 04/30/2021  Weight (lbs) 322 lb 1.5 oz 322 lb 5 oz 337 lb  Weight (kg) 146.1 kg 146.2 kg 152.862 kg      Telemetry    SR 80's, rare PVCs, 2 NSVT, one PM - Personally Reviewed  ECG    SR 76bpm, PACs - Personally Reviewed  Physical Exam   GEN: sedated  Neck: difficult to assess Cardiac: RRR, no murmurs, rubs, or gallops.  Respiratory: intubated, clear ant/lat auscultation this AM GI: Soft, nontender MS: trace if any edema; No deformity. Neuro:  unable to assess Psych: unable to assess   Labs    High Sensitivity Troponin:   Recent Labs  Lab 04/29/21 1247 04/29/21 1659  TROPONINIHS 74* 64*      Chemistry Recent Labs  Lab 04/30/21 0555 04/30/21 1754 05/01/21 0334 05/01/21 1356 05/02/21 0245  NA 134*   < > 139 139 136  135  K 4.4   < > 4.3 3.7 3.4*  3.3*  CL 105  --  106 105 103  100  CO2 16*  --  24 19* 21*  21*  GLUCOSE 322*  --  144* 216* 292*  295*  BUN 44*  --  54* 55* 63*  60*  CREATININE 2.20*  --  4.65* 4.57* 4.57*  4.58*  CALCIUM 9.3  --  9.3 9.3 9.3  9.4  PROT 6.8  --   --   --  5.9*  ALBUMIN 3.2*  --   --   --  2.4*  2.4*  AST 43*  --   --   --  19  ALT 135*  --   --   --  61*  ALKPHOS 55  --   --   --  42  BILITOT 0.7  --   --   --  1.2  GFRNONAA 36*  --  15* 15* 15*  15*  ANIONGAP 13  --  9 15 12  14   < > = values in this interval not displayed.     Hematology Recent Labs  Lab 04/29/21 1247 04/30/21 1754 04/30/21 1937 05/01/21 0107 05/02/21 0245  WBC 9.6  --   --   --  10.9*  RBC 5.43  --   --   --  4.99  HGB 15.0   < > 17.0 15.3 13.9  HCT 45.3   < > 50.0 45.0 40.7  MCV 83.4  --   --   --  81.6  MCH 27.6  --   --   --  27.9  MCHC 33.1  --   --   --  34.2  RDW 18.1*  --   --   --  17.5*  PLT 221  --   --    --  165   < > = values in this interval not displayed.    BNP Recent Labs  Lab 04/29/21 1247  BNP 1,407.7*     DDimer No results for input(s): DDIMER in the last 168 hours.   Radiology    DG Chest 1 View Result Date: 05/01/2021 CLINICAL DATA:  Respiratory failure. EXAM: CHEST  1 VIEW COMPARISON:  04/30/2021. FINDINGS: Endotracheal tube, NG tube, left IJ line in stable position. AICD in stable position. Stable severe cardiomegaly. Diffuse bilateral pulmonary infiltrates/edema again noted. Slight interim improvement. No pleural effusion or pneumothorax. IMPRESSION: 1.  Lines and tubes in stable position. 2. AICD in stable position. Stable severe cardiomegaly. Diffuse bilateral pulmonary infiltrates/edema again noted with slight interim improvement. Electronically Signed   By: Thomas  Register   On: 05/01/2021 06:41     Cardiac Studies    04/30/21: TTE  1. Left ventricular ejection fraction, by estimation, is <20%. The left  ventricle has severely decreased function. The left ventricle demonstrates  global hypokinesis. The left ventricular internal cavity size was severely  dilated. Left ventricular  diastolic parameters are consistent with Grade III diastolic dysfunction  (restrictive). Elevated left atrial pressure.   2. Right ventricular systolic function is moderately reduced. The right  ventricular size is normal. There is mildly elevated pulmonary artery  systolic pressure. The estimated right ventricular systolic pressure is  38.4 mmHg.   3. Left atrial size was severely dilated.   4. The mitral valve is normal in structure. Trivial mitral valve  regurgitation.   5. The aortic valve is tricuspid. Aortic valve regurgitation is not  visualized. No aortic stenosis is present.   6. The inferior vena cava is dilated in size with <50% respiratory  variability, suggesting right atrial pressure of 15 mmHg.      06/06/2020: TTE IMPRESSIONS   1. Left ventricular ejection fraction,  by estimation, is 20 to 25%. The  left ventricle has severely decreased function. The left ventricle  demonstrates global hypokinesis. The left ventricular internal cavity size  was mildly dilated. There is mild left  ventricular hypertrophy. Left ventricular diastolic parameters are  consistent with Grade I diastolic dysfunction (impaired relaxation).   2. Right ventricular systolic function is normal. The right ventricular  size is normal.   3. Left atrial size was mildly dilated.   4. The mitral valve is normal in structure. Trivial mitral valve  regurgitation. No evidence of mitral stenosis.   5. The aortic valve is normal in structure. Aortic valve regurgitation is  not visualized. No aortic stenosis is present.   6. The inferior vena cava is normal in size with greater than 50%  respiratory variability, suggesting right atrial pressure of 3 mmHg.     05/17/2015: R/LHC Findings:   RA = 7 RV = 42/3/7 PA =  43/30 (39) PCW = 26 Fick cardiac output/index = 4.9/2.1 PVR = 2.7 WU SVR = 1651 FA sat = 95% PA sat = 65%, 65% Ao = 122/97 (109) LV = 110/31/33   Assessment: 1) Normal coronaries 2) Moderately elevated filling pressures with moderately depressed cardiac output    Patient Profile     45 y.o. male with a hx of HTN, renal Ca (s/p  R nephrectomy), CKD (III), DM, OSA, morbid obesity, gout, NICM, chronic CHF (systolic), S-ICD, AFib admitted with AFib RVR and decompensated CHF  Device information [employer] S-ICD implanted 05/17/2015 > gen change 05/16/2020 Gen change complicated by flash pulmonary edema suffered traumatic intubation/aspiration + hx of appropriate shock 2019 for VT   AFib hx Diagnosed 2018 AAD hx Amiodarone started 2018 (also has hx of VT w/shock 2019)  Assessment & Plan      Paroxysmal Afib RVR CHA2DS2Vasc is 3, on eliquis, appropriately dosed Chronically on amiodarone for his AF and VT hx He had missed a couple Eliquis doses of late underwent  TEE/DCCV yesterday  Remains on amiodarone gtt, maintaining SR with a rare brief PAT Eliquis out patient >> heparin  Continue amiodarone gtt  2. NSVT infrequent Continue amiodarone gtt   3. Acute/chronic CHF 4. Cardiogenic shock 5. Respiratory failure 6. AKI on CKD despite DCCV respiratory status did not improve and ultimately required intubation AHF team managing Poss need for CRRT On levo, dobutamine  New foley placed by urology last night with 1000ml out, 1300 total Creat remains high COOx 82.7  D/w RN with sedation wean following commands  Atrial ectopy is frequent, keep amio going Dr. Camnitz has seen and examined the patient as well. Continue amiodarone gtt going until clinically improved EP will sign off though remain available, please recall if needed.     For questions or updates, please contact [facility] Please consult [email or web address] for contact info under        Signed, Renee Lynn Ursuy, PA-C  05/02/2021, 7:07 AM    I have seen and examined this patient with Renee Ursuy.  Agree with above, note added to reflect my findings.  On exam, RRR, no murmurs. Remains intubated and sedated on multiple vasoactive medications. Making urine currently. Would continue IV amiodarone until at least vasoactive medications are discontinued. Will likely need a higher dose at discharge.  EP to sign off. Call back if questions.  Will M. Camnitz MD 05/02/2021 7:48 AM   

## 2021-05-02 NOTE — Progress Notes (Signed)
ANTICOAGULATION CONSULT NOTE - Follow up [facility] for heparin Indication: atrial fibrillation  Allergies  Allergen Reactions   Aspirin Shortness Of Breath, Itching and Rash     Burning sensation (Patient reports he tolerates other NSAIDS)    Bee Venom Hives and Swelling   Lisinopril Cough   Tomato Rash    Patient Measurements: Height: '5\' 8"'$  (172.7 cm) Weight: (!) 146.1 kg (322 lb 1.5 oz) IBW/kg (Calculated) : 68.4 Heparin Dosing Weight: 103kg  Vital Signs: Temp: 98.4 F (36.9 C) (07/29 0752) Temp Source: Axillary (07/29 0752) BP: 106/60 (07/29 0600) Pulse Rate: 65 (07/29 0600)  Labs: Recent Labs    04/29/21 1247 04/29/21 1659 04/30/21 0555 04/30/21 1937 05/01/21 0107 05/01/21 0334 05/01/21 1356 05/01/21 1655 05/02/21 0245 05/02/21 0306  HGB 15.0  --    < > 17.0 15.3  --   --   --  13.9  --   HCT 45.3  --    < > 50.0 45.0  --   --   --  40.7  --   PLT 221  --   --   --   --   --   --   --  165  --   APTT  --   --   --   --   --   --   --  69*  --  50*  HEPARINUNFRC  --   --   --   --   --   --   --  >1.10*  --   --   CREATININE 1.99*  --    < >  --   --  4.65* 4.57*  --  4.57*  4.58*  --   TROPONINIHS 74* 64*  --   --   --   --   --   --   --   --    < > = values in this interval not displayed.     Estimated Creatinine Clearance: 28.4 mL/min (A) (by C-G formula based on SCr of 4.57 mg/dL (H)).   Medical History: Past Medical History:  Diagnosis Date   Anxiety    Aspirin allergy    Childhood asthma    Chronic systolic CHF (congestive heart failure) ([place])    a. EF 20-25% in 2012. b. EF 45-50% in 10/2011 with nonischemic nuc - presumed NICM. c. 12/2014 Echo: Sev depressed LV fxn, sev dil LV, mild LVH, mild MR, sev dil LA, mildly reduced RV fxn.   CKD (chronic kidney disease) stage 2, GFR 60-89 ml/min    H/O vasectomy 12/2019   High cholesterol    Hypertension    Morbid obesity ([place])    Nephrolithiasis    OSA on CPAP    Paroxysmal atrial  fibrillation ([place])    Presumed NICM    a. 04/2014 Myoview: EF 26%, glob HK, sev glob HK, ? prior infarct;  b. Never cathed 2/2 CKD.   Renal cell carcinoma ([place])    a. s/p Rt robotic assisted partial converted to radical nephrectomy on 01/2013.   Troponin level elevated    a. 04/2014, 12/2014: felt due to CHF.   Type II diabetes mellitus (H[facility])    Ventricular tachycardia (H[facility])    a. appropriate ICD therapy 12/2017    Assessment: 45 year old male s/p emergent TEE/cardioversion due to hemodynamic instability 7/27. Patient was anticoagulated with apixaban. Given multiorgan failure will transition patient over to heparin. Will need to be aggressive with dosing given  recent cardioversion.   Aptt low this morning at 50, will adjust rate. Heparin level still not correlated. Hgb is down slightly from 15>13, plt ok 165. No bleeding noted.   Goal of Therapy:  Heparin level 0.3-0.7 units/ml Aptt 66-103 sec Monitor platelets by anticoagulation protocol: Yes   Plan:  Increase Heparin drip to 1800 uts/hr  Daily aptt/heparin level Monitor CBC and s/s bleeding   Erin Hearing PharmD., BCPS Clinical Pharmacist 05/02/2021 8:22 AM

## 2021-05-02 NOTE — Consult Note (Signed)
Consultation: urinary retention Requested by: Dr. Loralie Champagne   History of Present Illness: 45 y.o. male with decompensated heart failure and atrial fibrillation on a ventilator with respiratory distress.  Had a Foley catheter in place and was on a Lasix drip.  Urine output diminished acutely.  Foley catheter was removed and a new 1 was inserted but only 20 cc of bloody urine output.  Bladder scan for over 1 L.  Foley removed and replaced by Urology 05/01/2021. Draining well now and urine clear. Excellent UOP.   Past Medical History:  Diagnosis Date   Anxiety    Aspirin allergy    Childhood asthma    Chronic systolic CHF (congestive heart failure) ([place])    a. EF 20-25% in 2012. b. EF 45-50% in 10/2011 with nonischemic nuc - presumed NICM. c. 12/2014 Echo: Sev depressed LV fxn, sev dil LV, mild LVH, mild MR, sev dil LA, mildly reduced RV fxn.   CKD (chronic kidney disease) stage 2, GFR 60-89 ml/min    H/O vasectomy 12/2019   High cholesterol    Hypertension    Morbid obesity ([place])    Nephrolithiasis    OSA on CPAP    Paroxysmal atrial fibrillation ([place])    Presumed NICM    a. 04/2014 Myoview: EF 26%, glob HK, sev glob HK, ? prior infarct;  b. Never cathed 2/2 CKD.   Renal cell carcinoma ([place])    a. s/p Rt robotic assisted partial converted to radical nephrectomy on 01/2013.   Troponin level elevated    a. 04/2014, 12/2014: felt due to CHF.   Type II diabetes mellitus ([place])    Ventricular tachycardia ([place])    a. appropriate ICD therapy 12/2017   Past Surgical History:  Procedure Laterality Date   APPENDECTOMY  07/2004   BIOPSY  02/10/2021   Procedure: BIOPSY;  Surgeon: Gatha Mayer, MD;  [facility]: [facility];  Service: Endoscopy;;   CARDIAC CATHETERIZATION N/A 05/17/2015   Procedure: Right/Left Heart Cath and Coronary Angiography;  Surgeon: Jolaine Artist, MD;  Location: [facility];  Service: Cardiovascular;  Laterality: N/A;   CARDIOVERSION N/A 04/30/2021   Procedure:  CARDIOVERSION;  Surgeon: Larey Dresser, MD;  Location: [facility] ENDOSCOPY;  Service: Cardiovascular;  Laterality: N/A;   COLONOSCOPY WITH PROPOFOL N/A 02/10/2021   Procedure: COLONOSCOPY WITH PROPOFOL;  Surgeon: Gatha Mayer, MD;  [facility];  Service: Endoscopy;  Laterality: N/A;   EP IMPLANTABLE DEVICE N/A 05/17/2015   Procedure: SubQ ICD Implant;  Surgeon: Will Meredith Leeds, MD;  Location: [place] [facility];  Service: Cardiovascular;  Laterality: N/A;   ESOPHAGOGASTRODUODENOSCOPY (EGD) WITH PROPOFOL N/A 02/10/2021   Procedure: ESOPHAGOGASTRODUODENOSCOPY (EGD) WITH PROPOFOL;  Surgeon: Gatha Mayer, MD;  [facility];  Service: Endoscopy;  Laterality: N/A;   ROBOTIC ASSISTED LAPAROSCOPIC LYSIS OF ADHESION  01/13/2013   Procedure: ROBOTIC ASSISTED LAPAROSCOPIC LYSIS OF ADHESION EXTENSIVE;  Surgeon: Alexis Frock, MD;  [facility]: [facility];  Service: Urology;;   ROBOTIC ASSITED PARTIAL NEPHRECTOMY Right 01/13/2013   Procedure: ROBOTIC ASSITED PARTIAL NEPHRECTOMY CONVERTED TO ROBOTIC ASSISTED RIGHT RADICAL NEPHRECTOMY;  Surgeon: Alexis Frock, MD;  [facility]: [facility];  Service: Urology;  Laterality: Right;   SUBQ ICD CHANGEOUT N/A 05/16/2020   Procedure: SUBQ ICD CHANGEOUT;  Surgeon: [name], MD;  Location: [place] [facility];  Service: Cardiovascular;  Laterality: N/A;   SUBQ ICD CHANGEOUT N/A 05/15/2020   Procedure: SUBQ ICD CHANGEOUT;  Surgeon: [name], MD;  Location:  [place] [facility] INVASIVE CV LAB;  Service: Cardiovascular;  Laterality: N/A;   TEE WITHOUT CARDIOVERSION N/A 04/30/2021   Procedure: TRANSESOPHAGEAL ECHOCARDIOGRAM (TEE);  Surgeon: Larey Dresser, MD;  Location: [facility] ENDOSCOPY;  Service: Cardiovascular;  Laterality: N/A;   VASECTOMY      Home Medications:  Medications Prior to Admission  Medication Sig Dispense Refill Last Dose   allopurinol (ZYLOPRIM) 100 MG tablet Take 1 tablet (100 mg total) by mouth daily. 90 tablet 3 04/28/2021   amiodarone (PACERONE)  100 MG tablet TAKE 1 TABLET (100 MG TOTAL) BY MOUTH DAILY. (Patient taking differently: Take 100 mg by mouth daily.) 30 tablet 11 04/29/2021   apixaban (ELIQUIS) 5 MG TABS tablet TAKE 1 TABLET (5 MG TOTAL) BY MOUTH 2 (TWO) TIMES DAILY. RESUME WITH EVENING DOSE ON SUNDAY (Patient taking differently: Take 5 mg by mouth 2 (two) times daily.) 60 tablet 10 04/28/2021 at 1930   busPIRone (BUSPAR) 10 MG tablet TAKE 1 TABLET (10 MG TOTAL) BY MOUTH 2 (TWO) TIMES DAILY. (Patient taking differently: Take 10 mg by mouth 2 (two) times daily.) 60 tablet 11 04/28/2021   carvedilol (COREG) 6.25 MG tablet TAKE 1 TABLET (6.25 MG TOTAL) BY MOUTH 2 (TWO) TIMES DAILY. (Patient taking differently: Take 6.25 mg by mouth 2 (two) times daily.) 30 tablet 11 04/28/2021 at 1930   cetirizine (ZYRTEC) 10 MG tablet Take 1 tablet (10 mg total) by mouth daily. 90 tablet 3 Past Month   empagliflozin (JARDIANCE) 10 MG TABS tablet TAKE 1 TABLET (10 MG TOTAL) BY MOUTH DAILY BEFORE BREAKFAST. (Patient taking differently: Take 10 mg by mouth daily before breakfast.) 90 tablet 3 04/28/2021   famotidine (PEPCID) 40 MG tablet TAKE 1 TABLET (40 MG TOTAL) BY MOUTH DAILY AS NEEDED FOR HEARTBURN OR INDIGESTION. (Patient taking differently: Take 40 mg by mouth daily as needed for indigestion or heartburn.) 30 tablet 2 Past Month   ferrous sulfate 325 (65 FE) MG tablet Take 1 tablet (325 mg total) by mouth daily with breakfast. 90 tablet 0 04/28/2021   fluticasone (FLONASE) 50 MCG/ACT nasal spray Place 2 sprays into both nostrils daily. (Patient taking differently: Place 2 sprays into both nostrils daily as needed for allergies.) 16 g 6 Past Month   gabapentin (NEURONTIN) 300 MG capsule Take 1 capsule (300 mg total) by mouth 3 (three) times daily. 270 capsule 3 04/28/2021   glipiZIDE (GLUCOTROL) 10 MG tablet Take 1 tablet (10 mg total) by mouth 2 (two) times daily before a meal. 180 tablet 3 04/28/2021   insulin aspart (NOVOLOG) 100 UNIT/ML FlexPen INJECT 0-15  UNIT(S) INTO THE SKIN THREE TIMES DAILY WITH MEALS VIA SLIDING SCALE (Patient taking differently: Inject 0-15 Units into the skin 3 (three) times daily with meals.) 15 mL 12 04/28/2021   insulin NPH-regular Human (70-30) 100 UNIT/ML injection INJECT 38 UNITS INTO THE SKIN 2 (TWO) TIMES DAILY WITH A MEAL. (Patient taking differently: Inject 38 Units into the skin 2 (two) times daily with a meal.) 60 mL 12 04/28/2021   metFORMIN (GLUCOPHAGE) 1000 MG tablet Take 1 tablet (1,000 mg total) by mouth 2 (two) times daily with a meal. 60 tablet 3 04/28/2021   oxyCODONE (ROXICODONE) 5 MG immediate release tablet Take 1 tablet (5 mg total) by mouth every 6 (six) hours as needed for up to 10 doses for breakthrough pain. 10 tablet 0 Past Month   sacubitril-valsartan (ENTRESTO) 49-51 MG TAKE 1 TABLET BY MOUTH 2 (TWO) TIMES DAILY. 60 tablet 6 04/28/2021   spironolactone (ALDACTONE)  25 MG tablet Take 1 tablet (25 mg total) by mouth daily. 30 tablet 6 04/28/2021   torsemide (DEMADEX) 20 MG tablet TAKE 1 TABLET (20 MG TOTAL) BY MOUTH DAILY. (Patient taking differently: Take 20 mg by mouth daily.) 60 tablet 6 04/28/2021   traZODone (DESYREL) 100 MG tablet Take 1 tablet (100 mg total) by mouth at bedtime. For sleep 90 tablet 3 Past Week   Vitamin D, Ergocalciferol, (DRISDOL) 1.25 MG (50000 UNIT) CAPS capsule TAKE 1 CAPSULE (50,000 UNITS TOTAL) BY MOUTH ONCE A WEEK. (Patient taking differently: Take 50,000 Units by mouth every Monday.) 5 capsule 2 Past Week   Accu-Chek FastClix Lancets MISC USE AS DIRECTED FOUR TIMES DAILY 102 each 11    blood glucose meter kit and supplies Use up to four times daily as directed. 1 each 0    Continuous Blood Gluc Receiver (FREESTYLE LIBRE 14 DAY READER) DEVI 1 each by Does not apply route 3 (three) times daily between meals as needed. 1 each 1    Continuous Blood Gluc Sensor (GUARDIAN SENSOR 3) MISC 1 Device by Does not apply route 3 (three) times daily between meals as needed. 5 each 6     Insulin Pen Needle 31G X 5 MM MISC USE AS DIRECTED 100 each 12    Insulin Syringe-Needle U-100 31G X 5/16" 0.5 ML MISC USE TO INJECT INSULIN TWICE DAILY. 100 each 11    Magnesium Oxide 200 MG TABS Take 100 mg by mouth daily.   04/27/2021   predniSONE (DELTASONE) 10 MG tablet take 2 tablets by mouth for 2 days, then 1 tab for 2 days, then stop (Patient not taking: Reported on 04/29/2021) 6 tablet 0 Completed Course   Allergies:  Allergies  Allergen Reactions   Aspirin Shortness Of Breath, Itching and Rash     Burning sensation (Patient reports he tolerates other NSAIDS)    Bee Venom Hives and Swelling   Lisinopril Cough   Tomato Rash    Family History  Problem Relation Age of Onset   Hypertension Mother    Diabetes Maternal Aunt    Heart attack Neg Hx    Stroke Neg Hx    Colon cancer Neg Hx    Pancreatic cancer Neg Hx    Stomach cancer Neg Hx    Liver cancer Neg Hx    Esophageal cancer Neg Hx    Social History:  reports that he has been smoking cigarettes. He has a 5.50 pack-year smoking history. He has never used smokeless tobacco. He reports current alcohol use. He reports current drug use. Frequency: 14.00 times per week. Drug: Marijuana.  ROS: A complete review of systems was performed.  All systems are negative except for pertinent findings as noted. Review of Systems  Unable to perform ROS: Intubated (intu)    Physical Exam:  Vital signs in last 24 hours: Temp:  [97.9 F (36.6 C)-98.8 F (37.1 C)] 98.8 F (37.1 C) (07/29 1545) Pulse Rate:  [51-103] 67 (07/29 1615) Resp:  [17-35] 30 (07/29 1615) BP: (85-114)/(53-80) 114/79 (07/29 1600) SpO2:  [86 %-100 %] 97 % (07/29 1615) Arterial Line BP: (100-148)/(49-94) 116/63 (07/29 1615) FiO2 (%):  [40 %-50 %] 40 % (07/29 1216) Weight:  [146.1 kg] 146.1 kg (07/29 0120)  . Intake/Output Summary (Last 24 hours) at 05/02/2021 1655 Last data filed at 05/02/2021 1544 Gross per 24 hour  Intake 4291.41 ml  Output 4320 ml  Net  -28.59 ml     General:  intubated  Foley  in place - almost hubbed. Draining well. Urine clear. Mobile. Nurse will move up catheter holder to prevent tension on foley.   Laboratory Data:  Results for orders placed or performed during the hospital encounter of 04/29/21 (from the past 24 hour(s))  APTT     Status: Abnormal   Collection Time: 05/01/21  4:55 PM  Result Value Ref Range   aPTT 69 (H) 24 - 36 seconds  Heparin level (unfractionated)     Status: Abnormal   Collection Time: 05/01/21  4:55 PM  Result Value Ref Range   Heparin Unfractionated >1.10 (H) 0.30 - 0.70 IU/mL  Glucose, capillary     Status: Abnormal   Collection Time: 05/01/21  6:35 PM  Result Value Ref Range   Glucose-Capillary 288 (H) 70 - 99 mg/dL  Glucose, capillary     Status: Abnormal   Collection Time: 05/01/21  8:31 PM  Result Value Ref Range   Glucose-Capillary 337 (H) 70 - 99 mg/dL   Comment 1 Notify RN   Glucose, capillary     Status: Abnormal   Collection Time: 05/01/21 10:04 PM  Result Value Ref Range   Glucose-Capillary 302 (H) 70 - 99 mg/dL   Comment 1 Notify RN   Glucose, capillary     Status: Abnormal   Collection Time: 05/01/21 11:36 PM  Result Value Ref Range   Glucose-Capillary 292 (H) 70 - 99 mg/dL   Comment 1 Notify RN   Basic metabolic panel     Status: Abnormal   Collection Time: 05/02/21  2:45 AM  Result Value Ref Range   Sodium 136 135 - 145 mmol/L   Potassium 3.4 (L) 3.5 - 5.1 mmol/L   Chloride 103 98 - 111 mmol/L   CO2 21 (L) 22 - 32 mmol/L   Glucose, Bld 292 (H) 70 - 99 mg/dL   BUN 63 (H) 6 - 20 mg/dL   Creatinine, Ser 4.57 (H) 0.61 - 1.24 mg/dL   Calcium 9.3 8.9 - 10.3 mg/dL   GFR, Estimated 15 (L) >60 mL/min   Anion gap 12 5 - 15  CBC     Status: Abnormal   Collection Time: 05/02/21  2:45 AM  Result Value Ref Range   WBC 10.9 (H) 4.0 - 10.5 K/uL   RBC 4.99 4.22 - 5.81 MIL/uL   Hemoglobin 13.9 13.0 - 17.0 g/dL   HCT 40.7 39.0 - 52.0 %   MCV 81.6 80.0 - 100.0 fL   MCH  27.9 26.0 - 34.0 pg   MCHC 34.2 30.0 - 36.0 g/dL   RDW 17.5 (H) 11.5 - 15.5 %   Platelets 165 150 - 400 K/uL   nRBC 0.0 0.0 - 0.2 %  Renal function panel     Status: Abnormal   Collection Time: 05/02/21  2:45 AM  Result Value Ref Range   Sodium 135 135 - 145 mmol/L   Potassium 3.3 (L) 3.5 - 5.1 mmol/L   Chloride 100 98 - 111 mmol/L   CO2 21 (L) 22 - 32 mmol/L   Glucose, Bld 295 (H) 70 - 99 mg/dL   BUN 60 (H) 6 - 20 mg/dL   Creatinine, Ser 4.58 (H) 0.61 - 1.24 mg/dL   Calcium 9.4 8.9 - 10.3 mg/dL   Phosphorus 4.7 (H) 2.5 - 4.6 mg/dL   Albumin 2.4 (L) 3.5 - 5.0 g/dL   GFR, Estimated 15 (L) >60 mL/min   Anion gap 14 5 - 15  Hepatic function panel     Status:  Abnormal   Collection Time: 05/02/21  2:45 AM  Result Value Ref Range   Total Protein 5.9 (L) 6.5 - 8.1 g/dL   Albumin 2.4 (L) 3.5 - 5.0 g/dL   AST 19 15 - 41 U/L   ALT 61 (H) 0 - 44 U/L   Alkaline Phosphatase 42 38 - 126 U/L   Total Bilirubin 1.2 0.3 - 1.2 mg/dL   Bilirubin, Direct 0.3 (H) 0.0 - 0.2 mg/dL   Indirect Bilirubin 0.9 0.3 - 0.9 mg/dL  Glucose, capillary     Status: Abnormal   Collection Time: 05/02/21  3:02 AM  Result Value Ref Range   Glucose-Capillary 345 (H) 70 - 99 mg/dL   Comment 1 Notify RN   APTT     Status: Abnormal   Collection Time: 05/02/21  3:06 AM  Result Value Ref Range   aPTT 50 (H) 24 - 36 seconds  .Cooxemetry Panel (carboxy, met, total hgb, O2 sat)     Status: None   Collection Time: 05/02/21  3:10 AM  Result Value Ref Range   Total hemoglobin 13.2 12.0 - 16.0 g/dL   O2 Saturation 82.7 %   Carboxyhemoglobin 0.8 0.5 - 1.5 %   Methemoglobin 1.1 0.0 - 1.5 %  Glucose, capillary     Status: Abnormal   Collection Time: 05/02/21  7:46 AM  Result Value Ref Range   Glucose-Capillary 247 (H) 70 - 99 mg/dL  Glucose, capillary     Status: Abnormal   Collection Time: 05/02/21 11:37 AM  Result Value Ref Range   Glucose-Capillary 200 (H) 70 - 99 mg/dL  APTT     Status: Abnormal   Collection  Time: 05/02/21 12:48 PM  Result Value Ref Range   aPTT 50 (H) 24 - 36 seconds  Glucose, capillary     Status: Abnormal   Collection Time: 05/02/21  3:57 PM  Result Value Ref Range   Glucose-Capillary 187 (H) 70 - 99 mg/dL   Recent Results (from the past 240 hour(s))  Resp Panel by RT-PCR (Flu A&B, Covid) Nasopharyngeal Swab     Status: None   Collection Time: 04/29/21  1:21 PM   Specimen: Nasopharyngeal Swab; Nasopharyngeal(NP) swabs in vial transport medium  Result Value Ref Range Status   SARS Coronavirus 2 by RT PCR NEGATIVE NEGATIVE Final    Comment: (NOTE) SARS-CoV-2 target nucleic acids are NOT DETECTED.  The SARS-CoV-2 RNA is generally detectable in upper respiratory specimens during the acute phase of infection. The lowest concentration of SARS-CoV-2 viral copies this assay can detect is 138 copies/mL. A negative result does not preclude SARS-Cov-2 infection and should not be used as the sole basis for treatment or other patient management decisions. A negative result may occur with  improper specimen collection/handling, submission of specimen other than nasopharyngeal swab, presence of viral mutation(s) within the areas targeted by this assay, and inadequate number of viral copies(<138 copies/mL). A negative result must be combined with clinical observations, patient history, and epidemiological information. The expected result is Negative.  Fact Sheet for Patients:  EntrepreneurPulse.com.au  Fact Sheet for Healthcare Providers:  IncredibleEmployment.be  This test is no t yet approved or cleared by the [place] FDA and  has been authorized for detection and/or diagnosis of SARS-CoV-2 by FDA under an Emergency Use Authorization (EUA). This EUA will remain  in effect (meaning this test can be used) for the duration of the COVID-19 declaration under Section 564(b)(1) of the Act, 21 U.S.C.section 360bbb-3(b)(1), unless the  authorization  is terminated  or revoked sooner.       Influenza A by PCR NEGATIVE NEGATIVE Final   Influenza B by PCR NEGATIVE NEGATIVE Final    Comment: (NOTE) The Xpert Xpress SARS-CoV-2/FLU/RSV plus assay is intended as an aid in the diagnosis of influenza from Nasopharyngeal swab specimens and should not be used as a sole basis for treatment. Nasal washings and aspirates are unacceptable for Xpert Xpress SARS-CoV-2/FLU/RSV testing.  Fact Sheet for Patients: EntrepreneurPulse.com.au  Fact Sheet for Healthcare Providers: IncredibleEmployment.be  This test is not yet approved or cleared by the [place] FDA and has been authorized for detection and/or diagnosis of SARS-CoV-2 by FDA under an Emergency Use Authorization (EUA). This EUA will remain in effect (meaning this test can be used) for the duration of the COVID-19 declaration under Section 564(b)(1) of the Act, 21 U.S.C. section 360bbb-3(b)(1), unless the authorization is terminated or revoked.  Performed at [facility], [place] [address] [place], [place] [address]   Culture, Respiratory w Gram Stain     Status: None   Collection Time: 04/30/21  5:53 PM   Specimen: Tracheal Aspirate; Respiratory  Result Value Ref Range Status   Specimen Description TRACHEAL ASPIRATE  Final   Special Requests NONE  Final   Gram Stain   Final    MODERATE WBC PRESENT,BOTH PMN AND MONONUCLEAR RARE GRAM POSITIVE COCCI IN PAIRS    Culture   Final    FEW Normal respiratory flora-no Staph aureus or Pseudomonas seen Performed at [facility], [address] [place], [place] [address]    Report Status 05/02/2021 FINAL  Final  MRSA Next Gen by PCR, Nasal     Status: None   Collection Time: 04/30/21  7:52 PM   Specimen: Nasal Mucosa; Nasal Swab  Result Value Ref Range Status   MRSA by PCR Next Gen NOT DETECTED NOT DETECTED Final    Comment: (NOTE) The GeneXpert MRSA Assay (FDA approved  for NASAL specimens only), is one component of a comprehensive MRSA colonization surveillance program. It is not intended to diagnose MRSA infection nor to guide or monitor treatment for MRSA infections. Test performance is not FDA approved in patients less than 20 years old. Performed at [facility], [place] [address], [place], [place] [address]    Creatinine: Recent Labs    04/29/21 1247 04/30/21 0555 05/01/21 0334 05/01/21 1356 05/02/21 0245  CREATININE 1.99* 2.20* 4.65* 4.57* 4.57*  4.58*    Impression/Assessment/plan:  Urinary retention - difficult foley - Continue Foley catheter to drainage for at least 7 days days due to greater than thousand mL of urine returned with catheter placement and concern for bladder stretch injury.  Would only trial void once patient is no longer sedated, improving with mobilization, recovered from bladder stretch injury.    Festus Aloe 05/02/2021, 4:54 PM

## 2021-05-02 NOTE — TOC Initial Note (Signed)
Transition of Care [facility]) - Initial/Assessment Note    Patient Details  Name: Jeffrey [name] MRN: [id number] Date of Birth: August 02, 1976  Transition of [facility]) CM/SW Contact:    Erenest Rasher, RN Phone Number: [phone] 05/02/2021, 2:30 PM  Clinical Narrative:                  TOC CM spoke to pt's wife at bedside. States she is off for the summer. She states she applied for disability 3 x and pt was denied. States she would like to apply again. She has family/friend that will take him to appts when she is working. Would like assistance with transportation. Pt does not have DME. Will need PT eval once stable. CSW at bedside to assist with disability, transportation and food stamps. Will continue to follow for dc needs.     Expected Discharge Plan: Home/Self Care Barriers to Discharge: Continued Medical Work up   Patient Goals and CMS Choice Patient states their goals for this hospitalization and ongoing recovery are:: get back to his baseline CMS Medicare.gov Compare Post Acute Care list provided to:: Patient Represenative (must comment) (wife, [name]) Choice offered to / list presented to : Spouse  Expected Discharge Plan and Services Expected Discharge Plan: Home/Self Care In-house Referral: Clinical Social Work Discharge Planning Services: CM Consult   Living arrangements for the past 2 months: Single Family Home                                      Prior Living Arrangements/Services Living arrangements for the past 2 months: Single Family Home Lives with:: Spouse, Minor Children Patient language and need for interpreter reviewed:: Yes        Need for Family Participation in Patient Care: Yes (Comment) Care giver support system in place?: Yes (comment)   Criminal Activity/Legal Involvement Pertinent to Current Situation/Hospitalization: No - Comment as needed  Activities of Daily Living      Permission Sought/Granted Permission sought to share  information with : Case Manager, PCP, Family Supports Permission granted to share information with : Yes, Verbal Permission Granted  Share Information with NAME: Dailen Milne  Permission granted to share info w AGENCY: [place] granted to share info w Relationship: wife  Permission granted to share info w Contact Information: [phone]  Emotional Assessment Appearance:: Appears stated age Attitude/Demeanor/Rapport: Unresponsive       Psych Involvement: No (comment)  Admission diagnosis:  Atrial fibrillation with rapid ventricular response ([place]) [I48.91] Nonsustained ventricular tachycardia ([place]) [I47.2] Atrial fibrillation with RVR ([place]) [I48.91] Chest pain, unspecified type [R07.9] Patient Active Problem List   Diagnosis Date Noted   Cardiogenic shock ([place])    Atrial fibrillation with RVR ([place]) 04/30/2021   Acute hypoxemic respiratory failure ([place])    Right wrist pain 04/16/2021   Absolute anemia    Rectal bleeding    Iron deficiency 12/25/2020   Long term current use of anticoagulant - apixaban 12/25/2020   Paroxysmal atrial fibrillation (H[facility])    Lactic acidosis 11/22/2020   Hypotension 11/22/2020   Aspiration into airway    ICD (implantable cardioverter-defibrillator) battery depletion 05/15/2020   Endotracheal tube present    Respiratory failure ([place])    Acute encephalopathy    Acute pulmonary edema ([place])    NICM (nonischemic cardiomyopathy) ([place]) 04/24/2020   S/P vasectomy 12/20/2019   Anxiety 12/20/2019   Hemoglobin A1C between  7% and 9% indicating borderline diabetic control 12/20/2019   Seasonal allergies 12/20/2019   Insomnia 12/20/2019   Vasectomy evaluation 06/20/2019   Visual problems 06/20/2019   Blurry vision, bilateral 06/20/2019   Hyperglycemia 02/13/2019   Hyperosmolar (nonketotic) coma ([place]) 01/19/2019   AICD (automatic cardioverter/defibrillator) present 01/19/2019   Hyperlipidemia 12/07/2018   Follow-up exam 11/22/2018   Class 3  severe obesity due to excess calories with serious comorbidity and body mass index (BMI) of 45.0 to 49.9 in adult ([place]) 11/22/2018   Dizziness 11/22/2018   Lingular pneumonia 08/04/2018   Ventricular tachycardia ([place]) 12/30/2017   PAF (paroxysmal atrial fibrillation) ([place])    Dilated cardiomyopathy ([place])    Pollen allergy 02/17/2017   Dyslipidemia 02/17/2017   Hematochezia 11/08/2015   CKD (chronic kidney disease), stage III ([place]) 06/20/2015   Cardiac defibrillator in situ    Chronic systolic CHF (congestive heart failure) ([place]) 03/11/2015   Gouty arthritis 03/11/2015   Chest pain 12/28/2014   Acute renal failure superimposed on stage 3 chronic kidney disease ([place]) 04/08/2014   Aspirin allergy 04/08/2014   Renal cell carcinoma ([place])    Gout attack 11/10/2011   Morbid obesity ([place]) 10/23/2011   OSA (obstructive sleep apnea) 10/23/2011   Type 2 diabetes mellitus with stage 3 chronic kidney disease, with long-term current use of insulin ([place]) 10/22/2011   Hyperlipidemia associated with type 2 diabetes mellitus ([place]) 12/25/2010   Hypertension associated with diabetes ([place]) 12/25/2010   PCP:  [name], FNP (Inactive) Pharmacy:   [facility] [address] [place] [address] Phone: [phone] Fax: [phone]  [id number] [place]: [phone]: [phone] [id number] [id number]  [id number]: [phone]: [phone]   Social Determinants of Health (SDOH) Interventions    Readmission Risk Interventions No flowsheet data found.

## 2021-05-03 ENCOUNTER — Inpatient Hospital Stay (HOSPITAL_COMMUNITY): Payer: Medicaid Other

## 2021-05-03 DIAGNOSIS — I472 Ventricular tachycardia: Secondary | ICD-10-CM

## 2021-05-03 DIAGNOSIS — Z4682 Encounter for fitting and adjustment of non-vascular catheter: Secondary | ICD-10-CM | POA: Diagnosis not present

## 2021-05-03 DIAGNOSIS — I4891 Unspecified atrial fibrillation: Secondary | ICD-10-CM | POA: Diagnosis not present

## 2021-05-03 DIAGNOSIS — R079 Chest pain, unspecified: Secondary | ICD-10-CM | POA: Diagnosis not present

## 2021-05-03 DIAGNOSIS — R570 Cardiogenic shock: Secondary | ICD-10-CM | POA: Diagnosis not present

## 2021-05-03 DIAGNOSIS — J9601 Acute respiratory failure with hypoxia: Secondary | ICD-10-CM | POA: Diagnosis not present

## 2021-05-03 DIAGNOSIS — J9602 Acute respiratory failure with hypercapnia: Secondary | ICD-10-CM | POA: Diagnosis not present

## 2021-05-03 DIAGNOSIS — Z452 Encounter for adjustment and management of vascular access device: Secondary | ICD-10-CM | POA: Diagnosis not present

## 2021-05-03 LAB — BASIC METABOLIC PANEL
Anion gap: 10 (ref 5–15)
BUN: 52 mg/dL — ABNORMAL HIGH (ref 6–20)
CO2: 24 mmol/L (ref 22–32)
Calcium: 9.3 mg/dL (ref 8.9–10.3)
Chloride: 113 mmol/L — ABNORMAL HIGH (ref 98–111)
Creatinine, Ser: 2.55 mg/dL — ABNORMAL HIGH (ref 0.61–1.24)
GFR, Estimated: 31 mL/min — ABNORMAL LOW (ref >60)
Glucose, Bld: 163 mg/dL — ABNORMAL HIGH (ref 70–99)
Potassium: 3.9 mmol/L (ref 3.5–5.1)
Sodium: 147 mmol/L — ABNORMAL HIGH (ref 135–145)

## 2021-05-03 LAB — RENAL FUNCTION PANEL
Albumin: 2.3 g/dL — ABNORMAL LOW (ref 3.5–5.0)
Anion gap: 10 (ref 5–15)
BUN: 53 mg/dL — ABNORMAL HIGH (ref 6–20)
CO2: 23 mmol/L (ref 22–32)
Calcium: 9.6 mg/dL (ref 8.9–10.3)
Chloride: 110 mmol/L (ref 98–111)
Creatinine, Ser: 2.94 mg/dL — ABNORMAL HIGH (ref 0.61–1.24)
GFR, Estimated: 26 mL/min — ABNORMAL LOW (ref >60)
Glucose, Bld: 197 mg/dL — ABNORMAL HIGH (ref 70–99)
Phosphorus: 3.4 mg/dL (ref 2.5–4.6)
Potassium: 4.1 mmol/L (ref 3.5–5.1)
Sodium: 143 mmol/L (ref 135–145)

## 2021-05-03 LAB — CBC
HCT: 40.2 % (ref 39.0–52.0)
Hemoglobin: 13.8 g/dL (ref 13.0–17.0)
MCH: 27.8 pg (ref 26.0–34.0)
MCHC: 34.3 g/dL (ref 30.0–36.0)
MCV: 80.9 fL (ref 80.0–100.0)
Platelets: 192 10*3/uL (ref 150–400)
RBC: 4.97 MIL/uL (ref 4.22–5.81)
RDW: 18 % — ABNORMAL HIGH (ref 11.5–15.5)
WBC: 9.6 10*3/uL (ref 4.0–10.5)
nRBC: 0 % (ref 0.0–0.2)

## 2021-05-03 LAB — COOXEMETRY PANEL
Carboxyhemoglobin: 0.9 % (ref 0.5–1.5)
Methemoglobin: 0.9 % (ref 0.0–1.5)
O2 Saturation: 72.8 %
Total hemoglobin: 12.4 g/dL (ref 12.0–16.0)

## 2021-05-03 LAB — RSV(RESPIRATORY SYNCYTIAL VIRUS) AB, BLOOD: RSV Ab: NEGATIVE

## 2021-05-03 LAB — POCT I-STAT 7, (LYTES, BLD GAS, ICA,H+H)
Acid-Base Excess: 3 mmol/L — ABNORMAL HIGH (ref 0.0–2.0)
Bicarbonate: 24.9 mmol/L (ref 20.0–28.0)
Calcium, Ion: 1.36 mmol/L (ref 1.15–1.40)
HCT: 39 % (ref 39.0–52.0)
Hemoglobin: 13.3 g/dL (ref 13.0–17.0)
O2 Saturation: 99 %
Patient temperature: 99.2
Potassium: 4.1 mmol/L (ref 3.5–5.1)
Sodium: 143 mmol/L (ref 135–145)
TCO2: 26 mmol/L (ref 22–32)
pCO2 arterial: 31.2 mmHg — ABNORMAL LOW (ref 32.0–48.0)
pH, Arterial: 7.511 — ABNORMAL HIGH (ref 7.350–7.450)
pO2, Arterial: 116 mmHg — ABNORMAL HIGH (ref 83.0–108.0)

## 2021-05-03 LAB — HEPARIN LEVEL (UNFRACTIONATED): Heparin Unfractionated: 0.7 IU/mL (ref 0.30–0.70)

## 2021-05-03 LAB — GLUCOSE, CAPILLARY
Glucose-Capillary: 147 mg/dL — ABNORMAL HIGH (ref 70–99)
Glucose-Capillary: 151 mg/dL — ABNORMAL HIGH (ref 70–99)
Glucose-Capillary: 182 mg/dL — ABNORMAL HIGH (ref 70–99)
Glucose-Capillary: 198 mg/dL — ABNORMAL HIGH (ref 70–99)
Glucose-Capillary: 207 mg/dL — ABNORMAL HIGH (ref 70–99)
Glucose-Capillary: 214 mg/dL — ABNORMAL HIGH (ref 70–99)

## 2021-05-03 LAB — APTT: aPTT: 80 seconds — ABNORMAL HIGH (ref 24–36)

## 2021-05-03 NOTE — Progress Notes (Signed)
NAME:  Jeffrey [name], MRN:  [id number], DOB:  [birth year], LOS: 3 ADMISSION DATE:  04/29/2021, CONSULTATION DATE:  7/27 REFERRING MD:  Marigene Ehlers, CHIEF COMPLAINT:  dyspnea   History of Present Illness:  45 year old man with extensive cardiac hx as below presenting with decompensated heart failure in setting of afib.  Cardioverted earlier today but unforuntately worse respiratory distress.  + orthopnea.  Saturating 80s on NRB and RR 40s, Aox3, anxious.  Pertinent  Medical History  NICM with AICD in place HTN R renal cell carcinoma s/p nephrectomy CKD 3a Hx of traumatic intubation  Significant Hospital Events: Including procedures, antibiotic start and stop dates in addition to other pertinent events   7/26 admitted. 7/27 cardioversion, worsening resp distress; intubation. 7/27 Echo with EF less than 20% (global HK) and grade 3 DD.  RV systolic function mod reduced. [id number] foley complication, new catheter placed by urology with good output. 7/29 Continued urine output and creatinine plateau; urology consult: leave foley in for 7 days  Interim History / Subjective:  Making a lot of urine overnight Seen by urology yesterday Vasopressors weaning Cr improving Coox OK  Objective   Blood pressure (!) 93/59, pulse 71, temperature 99.3 F (37.4 C), temperature source Axillary, resp. rate (!) 30, height '5\' 8"'$  (1.727 m), weight (!) 143.9 kg, SpO2 98 %. CVP:  [4 mmHg-77 mmHg] 5 mmHg  Vent Mode: PRVC FiO2 (%):  [40 %-50 %] 40 % Set Rate:  [30 bmp] 30 bmp Vt Set:  [550 mL] 550 mL PEEP:  [8 cmH20] 8 cmH20 Plateau Pressure:  [23 cmH20-25 cmH20] 23 cmH20   Intake/Output Summary (Last 24 hours) at 05/03/2021 0853 Last data filed at 05/03/2021 0700 Gross per 24 hour  Intake 4168.76 ml  Output 5930 ml  Net -1761.24 ml   Filed Weights   05/01/21 0456 05/02/21 0120 05/03/21 0436  Weight: (!) 146.2 kg (!) 146.1 kg (!) 143.9 kg    Examination:  General:  In bed on vent HENT: NCAT ETT in  place PULM: CTA B, vent supported breathing CV: RRR, no mgr GI: BS+, soft, nontender MSK: normal bulk and tone Neuro: sedated on vent  CXR > ICD in place interstitial edema, cardiomegaly, ETT in place   Resolved Hospital Problem list     Assessment & Plan:  Decompensated systolic heart failure Cardiogenic shock Continue aggressive diuresis Continue dobutamine, monitor COOX Wean off levophed for MAP > 65 Per heart failure team  Atrial fibrillation Tele Amiodarone Heparin infusion Per heart failure team  Acute respiratory failure with hypoxemia due to acute pulmonary edema Do not suspect CAP Stop antibiotics Full mechanical vent support VAP prevention Daily WUA/SBT Attempt pressure support today  AKI Continue diuresis Monitor BMET and UOP Replace electrolytes as needed  Urethral injury from foley catheter Keep foley in place for 7 days  Morbid obesity  Counseling after extubation  DM2 with hyperglycemia SSI  Need for sedation for mechanical ventilation RASS target -1 to -2 Continue propofol and fentanyl per PAD protocol   Best Practice (right click and "Reselect all SmartList Selections" daily)   Diet/type: tubefeeds DVT prophylaxis: systemic heparin GI prophylaxis: PPI Lines: Central line Foley:  Yes, and it is still needed Code Status:  full code Last date of multidisciplinary goals of care discussion [per primary]  Labs   CBC: Recent Labs  Lab 04/29/21 1247 04/30/21 1754 04/30/21 1937 05/01/21 0107 05/02/21 0245 05/03/21 0351 05/03/21 0408  WBC 9.6  --   --   --  10.9*  --  9.6  HGB 15.0   < > 17.0 15.3 13.9 13.3 13.8  HCT 45.3   < > 50.0 45.0 40.7 39.0 40.2  MCV 83.4  --   --   --  81.6  --  80.9  PLT 221  --   --   --  165  --  192   < > = values in this interval not displayed.    Basic Metabolic Panel: Recent Labs  Lab 04/30/21 0555 04/30/21 1754 05/01/21 0334 05/01/21 1356 05/02/21 0245 05/02/21 1652 05/03/21 0351  05/03/21 0407  NA 134*   < > 139 139 136  135 141 143 143  K 4.4   < > 4.3 3.7 3.4*  3.3* 3.5 4.1 4.1  CL 105  --  106 105 103  100 106  --  110  CO2 16*  --  24 19* 21*  21* 24  --  23  GLUCOSE 322*  --  144* 216* 292*  295* 196*  --  197*  BUN 44*  --  54* 55* 63*  60* 58*  --  53*  CREATININE 2.20*  --  4.65* 4.57* 4.57*  4.58* 3.60*  --  2.94*  CALCIUM 9.3  --  9.3 9.3 9.3  9.4 9.4  --  9.6  MG 2.1  --  2.3  --   --   --   --   --   PHOS  --   --   --   --  4.7*  --   --  3.4   < > = values in this interval not displayed.   GFR: Estimated Creatinine Clearance: 43.8 mL/min (A) (by C-G formula based on SCr of 2.94 mg/dL (H)). Recent Labs  Lab 04/29/21 1247 04/30/21 0955 04/30/21 1741 05/01/21 0334 05/02/21 0245 05/03/21 0408  PROCALCITON  --   --  0.47  --   --   --   WBC 9.6  --   --   --  10.9* 9.6  LATICACIDVEN  --  2.7* 3.0* 2.4*  --   --     Liver Function Tests: Recent Labs  Lab 04/30/21 0555 05/02/21 0245 05/03/21 0407  AST 43* 19  --   ALT 135* 61*  --   ALKPHOS 55 42  --   BILITOT 0.7 1.2  --   PROT 6.8 5.9*  --   ALBUMIN 3.2* 2.4*  2.4* 2.3*   No results for input(s): LIPASE, AMYLASE in the last 168 hours. No results for input(s): AMMONIA in the last 168 hours.  ABG    Component Value Date/Time   PHART 7.511 (H) 05/03/2021 0351   PCO2ART 31.2 (L) 05/03/2021 0351   PO2ART 116 (H) 05/03/2021 0351   HCO3 24.9 05/03/2021 0351   TCO2 26 05/03/2021 0351   ACIDBASEDEF 1.0 05/01/2021 0107   O2SAT 72.8 05/03/2021 0358     Coagulation Profile: No results for input(s): INR, PROTIME in the last 168 hours.  Cardiac Enzymes: No results for input(s): CKTOTAL, CKMB, CKMBINDEX, TROPONINI in the last 168 hours.  HbA1C: HbA1c, POC (prediabetic range)  Date/Time Value Ref Range Status  06/24/2020 09:20 AM 7.5 (A) 5.7 - 6.4 % Final  03/19/2020 10:22 AM 8.1 (A) 5.7 - 6.4 % Final   HbA1c, POC (controlled diabetic range)  Date/Time Value Ref Range  Status  06/24/2020 09:20 AM 7.5 (A) 0.0 - 7.0 % Final  03/19/2020 10:22 AM 8.1 (A) 0.0 - 7.0 % Final  HbA1c POC (<> result, manual entry)  Date/Time Value Ref Range Status  06/24/2020 09:20 AM 7.5 4.0 - 5.6 % Final  03/19/2020 10:22 AM 8.1 4.0 - 5.6 % Final   Hgb A1c MFr Bld  Date/Time Value Ref Range Status  04/30/2021 05:55 AM 9.4 (H) 4.8 - 5.6 % Final    Comment:    (NOTE)         Prediabetes: 5.7 - 6.4         Diabetes: >6.4         Glycemic control for adults with diabetes: <7.0   10/28/2020 09:32 AM 8.6 (H) 4.8 - 5.6 % Final    Comment:             Prediabetes: 5.7 - 6.4          Diabetes: >6.4          Glycemic control for adults with diabetes: <7.0     CBG: Recent Labs  Lab 05/02/21 2202 05/02/21 2336 05/03/21 0014 05/03/21 0344 05/03/21 0735  GLUCAP 205* 224* 214* 147* 182*    Critical care time: 33 minutes     Roselie Awkward, MD [facility] Pager: [phone] Cell: [phone] After 7:00 pm call Elink  [phone]

## 2021-05-03 NOTE — Progress Notes (Signed)
ANTICOAGULATION CONSULT NOTE - Follow up [place] for heparin Indication: atrial fibrillation  Allergies  Allergen Reactions   Aspirin Shortness Of Breath, Itching and Rash     Burning sensation (Patient reports he tolerates other NSAIDS)    Bee Venom Hives and Swelling   Lisinopril Cough   Tomato Rash    Patient Measurements: Height: '5\' 8"'$  (172.7 cm) Weight: (!) 143.9 kg (317 lb 3.9 oz) IBW/kg (Calculated) : 68.4 Heparin Dosing Weight: 103kg  Vital Signs: Temp: 99.3 F (37.4 C) (07/30 0803) Temp Source: Axillary (07/30 0803) BP: 118/65 (07/30 0949) Pulse Rate: 72 (07/30 1000)  Labs: Recent Labs    05/01/21 1655 05/02/21 0245 05/02/21 0306 05/02/21 1248 05/02/21 1652 05/03/21 0351 05/03/21 0407 05/03/21 0408 05/03/21 0933  HGB  --  13.9  --   --   --  13.3  --  13.8  --   HCT  --  40.7  --   --   --  39.0  --  40.2  --   PLT  --  165  --   --   --   --   --  192  --   APTT 69*  --    < > 50* 52*  --   --   --  80*  HEPARINUNFRC >1.10*  --   --   --   --   --  0.70  --   --   CREATININE  --  4.57*  4.58*  --   --  3.60*  --  2.94*  --   --    < > = values in this interval not displayed.     Estimated Creatinine Clearance: 43.8 mL/min (A) (by C-G formula based on SCr of 2.94 mg/dL (H)).   Medical History: Past Medical History:  Diagnosis Date   Anxiety    Aspirin allergy    Childhood asthma    Chronic systolic CHF (congestive heart failure) ([place])    a. EF 20-25% in 2012. b. EF 45-50% in 10/2011 with nonischemic nuc - presumed NICM. c. 12/2014 Echo: Sev depressed LV fxn, sev dil LV, mild LVH, mild MR, sev dil LA, mildly reduced RV fxn.   CKD (chronic kidney disease) stage 2, GFR 60-89 ml/min    H/O vasectomy 12/2019   High cholesterol    Hypertension    Morbid obesity ([place])    Nephrolithiasis    OSA on CPAP    Paroxysmal atrial fibrillation ([name])    Presumed NICM    a. 04/2014 Myoview: EF 26%, glob HK, sev glob HK, ? prior infarct;  b.  Never cathed 2/2 CKD.   Renal cell carcinoma ([place])    a. s/p Rt robotic assisted partial converted to radical nephrectomy on 01/2013.   Troponin level elevated    a. 04/2014, 12/2014: felt due to CHF.   Type II diabetes mellitus (HCC)    Ventricular tachycardia (HCC)    a. appropriate ICD therapy 12/2017    Assessment: 45 year old male s/p emergent TEE/cardioversion due to hemodynamic instability 7/27. Patient was anticoagulated with apixaban (last dose 7/27 at ~ 10pm). Given multiorgan failure he continues on heparin. Will need to be aggressive with dosing given recent cardioversion.  -heparin level= 0.7, aPTT=80 -hg= 13.8    Goal of Therapy:  Heparin level 0.3-0.7 units/ml Aptt 66-103 sec Monitor platelets by anticoagulation protocol: Yes   Plan:  Continue heparin at 2050 units/hr Daily aptt/heparin level  Hildred Laser, PharmD Clinical  Pharmacist **Pharmacist phone directory can now be found on [email or web address] ([email or web address]).  Listed under [place].

## 2021-05-03 NOTE — Progress Notes (Signed)
Patient ID: Jeffrey [name], male   DOB: [birth year], 45 y.o.   MRN: [id number]     Advanced Heart Failure Rounding Note  PCP-Cardiologist: Glori Bickers, MD   Subjective:    Creatinine 4.65 => 4.57 => 2.94.  Good UOP now, I/Os net negative 1761 on Lasix gtt 12 mg/hr, CVP now 10.  Weight down 5 lbs.   Co-ox 73%.  He remains on dobutamine 5, off NE.  MAP stable.   He is in NSR with PACs, on amiodarone 60 currently and heparin gtt.   Echo: EF < 20%, severe LV dilation, moderate RV dysfunction   Objective:   Weight Range: (!) 143.9 kg Body mass index is 48.24 kg/m.   Vital Signs:   Temp:  [98.5 F (36.9 C)-99.3 F (37.4 C)] 99.3 F (37.4 C) (07/30 0803) Pulse Rate:  [51-87] 71 (07/30 0800) Resp:  [21-44] 30 (07/30 0800) BP: (93-117)/(54-84) 93/59 (07/30 0600) SpO2:  [90 %-100 %] 98 % (07/30 0800) Arterial Line BP: (76-155)/(49-81) 121/70 (07/30 0800) FiO2 (%):  [40 %-50 %] 40 % (07/30 0800) Weight:  [143.9 kg] 143.9 kg (07/30 0436) Last BM Date: 04/29/21  Weight change: Filed Weights   05/01/21 0456 05/02/21 0120 05/03/21 0436  Weight: (!) 146.2 kg (!) 146.1 kg (!) 143.9 kg    Intake/Output:   Intake/Output Summary (Last 24 hours) at 05/03/2021 0841 Last data filed at 05/03/2021 0700 Gross per 24 hour  Intake 4168.76 ml  Output 5930 ml  Net -1761.24 ml      Physical Exam    General: Intubated/sedated Neck: No JVD, no thyromegaly or thyroid nodule.  Lungs: Clear to auscultation bilaterally with normal respiratory effort. CV: Nondisplaced PMI.  Heart regular S1/S2, no S3/S4, no murmur.  Trace ankle edema.  Abdomen: Soft, nontender, no hepatosplenomegaly, no distention.  Skin: Intact without lesions or rashes.  Neurologic: Wakes up per nursing.  Extremities: No clubbing or cyanosis.  HEENT: Normal.    Telemetry   NSR 70s with PACs (personally reviewed)   Labs    CBC Recent Labs    05/02/21 0245 05/03/21 0351 05/03/21 0408  WBC 10.9*  --  9.6  HGB  13.9 13.3 13.8  HCT 40.7 39.0 40.2  MCV 81.6  --  80.9  PLT 165  --  741   Basic Metabolic Panel Recent Labs    05/01/21 0334 05/01/21 1356 05/02/21 0245 05/02/21 1652 05/03/21 0351 05/03/21 0407  NA 139   < > 136  135 141 143 143  K 4.3   < > 3.4*  3.3* 3.5 4.1 4.1  CL 106   < > 103  100 106  --  110  CO2 24   < > 21*  21* 24  --  23  GLUCOSE 144*   < > 292*  295* 196*  --  197*  BUN 54*   < > 63*  60* 58*  --  53*  CREATININE 4.65*   < > 4.57*  4.58* 3.60*  --  2.94*  CALCIUM 9.3   < > 9.3  9.4 9.4  --  9.6  MG 2.3  --   --   --   --   --   PHOS  --   --  4.7*  --   --  3.4   < > = values in this interval not displayed.   Liver Function Tests Recent Labs    05/02/21 0245 05/03/21 0407  AST 19  --   ALT  61*  --   ALKPHOS 42  --   BILITOT 1.2  --   PROT 5.9*  --   ALBUMIN 2.4*  2.4* 2.3*   No results for input(s): LIPASE, AMYLASE in the last 72 hours. Cardiac Enzymes No results for input(s): CKTOTAL, CKMB, CKMBINDEX, TROPONINI in the last 72 hours.  BNP: BNP (last 3 results) Recent Labs    11/21/20 2222 04/29/21 1247  BNP 65.0 1,407.7*    ProBNP (last 3 results) No results for input(s): PROBNP in the last 8760 hours.   D-Dimer No results for input(s): DDIMER in the last 72 hours. Hemoglobin A1C No results for input(s): HGBA1C in the last 72 hours.  Fasting Lipid Panel Recent Labs    05/01/21 0334  TRIG 182*   Thyroid Function Tests No results for input(s): TSH, T4TOTAL, T3FREE, THYROIDAB in the last 72 hours.  Invalid input(s): FREET3   Other results:   Imaging    No results found.   Medications:     Scheduled Medications:  ALPRAZolam  0.5 mg Per Tube BID   chlorhexidine gluconate (MEDLINE KIT)  15 mL Mouth Rinse BID   Chlorhexidine Gluconate Cloth  6 each Topical Daily   docusate  100 mg Per Tube BID   feeding supplement (PROSource TF)  45 mL Per Tube TID   feeding supplement (VITAL HIGH PROTEIN)  1,000 mL Per Tube  Q24H   insulin aspart  0-20 Units Subcutaneous Q4H   insulin aspart  4 Units Subcutaneous Q4H   insulin detemir  50 Units Subcutaneous BID   mouth rinse  15 mL Mouth Rinse 10 times per day   pantoprazole sodium  40 mg Per Tube Daily   polyethylene glycol  17 g Per Tube Daily   sodium chloride flush  10-40 mL Intracatheter Q12H   sodium chloride flush  3 mL Intravenous Q12H    Infusions:  sodium chloride Stopped (04/30/21 2254)   amiodarone 60 mg/hr (05/03/21 0749)   cefTRIAXone (ROCEPHIN)  IV Stopped (05/02/21 1006)   DOBUTamine 5 mcg/kg/min (05/03/21 0700)   fentaNYL infusion INTRAVENOUS 175 mcg/hr (05/03/21 0700)   furosemide (LASIX) 200 mg in dextrose 5% 100 mL (18m/mL) infusion 12 mg/hr (05/03/21 0700)   heparin 2,050 Units/hr (05/03/21 0700)   norepinephrine (LEVOPHED) Adult infusion Stopped (05/03/21 0452)   propofol (DIPRIVAN) infusion 25 mcg/kg/min (05/03/21 0700)    PRN Medications: sodium chloride, fentaNYL, nitroGLYCERIN, ondansetron (ZOFRAN) IV, sodium chloride flush, sodium chloride flush  Assessment/Plan   1. Atrial fibrillation: Paroxysmal.  Suspect AF with RVR drove CHF exacerbation.  Patient has been on amiodarone and Eliquis at home, has missed some Eliquis doses. Emergent TEE-guided DCCV 7/27 due to inability to control HR, now back in NSR. - Continue amiodarone gtt, decrease to 30 mg/hr.  - Continue heparin gtt => eventually back to Eliquis.  - Eventually needs consideration for AF ablation. 2. Acute on chronic systolic CHF/cardiogenic shock: Nonischemic CMP, exacerbation likely driven by AF.  Echo in 9/21 with EF 20-25%.  Echo this admission with EF < 20%, severe LV dilation, moderate RV dysfunction.  Overnight on 7/27 intubated with pulmonary edema and required addition of NE up to 14.  He is now on dobutamine 5 and off NE, co-ox 73% with CVP down to 10.  Good UOP on Lasix 12 mg/hr.  Creatinine lower 4.65 => 4.57 => 2.94.  - Decrease dobutamine to 4 today.   -  Will continue Lasix gtt 12 mg/hr today. BMET in pm.  3. AKI  on CKD stage 3: Has solitary kidney.  Suspect cardiorenal syndrome/low output with AF/RVR in setting of cardiomyopathy.  Creatinine 2.2 => 4.65 => 4.57 => 2.94.  Urinary obstruction likely played a role, doing better now that urology inserted foley catheter.  - Continue dobutamine to maintain CO, gradual wean.  4. OSA 5. Acute hypoxemic respiratory failure: Suspect primarily CHF but has GPCs in sputum => likely respiratory flora.  Afebrile. Improved.  - Remains on ceftriaxone.  - Vent weaning per CCM, CXR ordered.    CRITICAL CARE Performed by: Loralie Champagne  Total critical care time: 35 minutes  Critical care time was exclusive of separately billable procedures and treating other patients.  Critical care was necessary to treat or prevent imminent or life-threatening deterioration.  Critical care was time spent personally by me on the following activities: development of treatment plan with patient and/or surrogate as well as nursing, discussions with consultants, evaluation of patient's response to treatment, examination of patient, obtaining history from patient or surrogate, ordering and performing treatments and interventions, ordering and review of laboratory studies, ordering and review of radiographic studies, pulse oximetry and re-evaluation of patient's condition.   Length of Stay: 3  Loralie Champagne, MD  05/03/2021, 8:41 AM  Advanced Heart Failure Team Pager [phone] (M-F; 7a - 5p)  Please contact [facility] for night-coverage after hours (5p -7a ) and weekends on [email or web address]

## 2021-05-03 NOTE — Progress Notes (Signed)
ANTICOAGULATION CONSULT NOTE - Follow up [place] for heparin Indication: atrial fibrillation  Allergies  Allergen Reactions   Aspirin Shortness Of Breath, Itching and Rash     Burning sensation (Patient reports he tolerates other NSAIDS)    Bee Venom Hives and Swelling   Lisinopril Cough   Tomato Rash    Patient Measurements: Height: '5\' 8"'$  (172.7 cm) Weight: (!) 143.9 kg (317 lb 3.9 oz) IBW/kg (Calculated) : 68.4 Heparin Dosing Weight: 103kg  Vital Signs: Temp: 99.2 F (37.3 C) (07/30 0357) Temp Source: Axillary (07/30 0357) BP: 93/59 (07/30 0600) Pulse Rate: 53 (07/30 0700)  Labs: Recent Labs    05/01/21 1655 05/02/21 0245 05/02/21 0306 05/02/21 1248 05/02/21 1652 05/03/21 0351 05/03/21 0407 05/03/21 0408  HGB  --  13.9  --   --   --  13.3  --  13.8  HCT  --  40.7  --   --   --  39.0  --  40.2  PLT  --  165  --   --   --   --   --  192  APTT 69*  --  50* 50* 52*  --   --   --   HEPARINUNFRC >1.10*  --   --   --   --   --  0.70  --   CREATININE  --  4.57*  4.58*  --   --  3.60*  --  2.94*  --      Estimated Creatinine Clearance: 43.8 mL/min (A) (by C-G formula based on SCr of 2.94 mg/dL (H)).   Medical History: Past Medical History:  Diagnosis Date   Anxiety    Aspirin allergy    Childhood asthma    Chronic systolic CHF (congestive heart failure) ([place])    a. EF 20-25% in 2012. b. EF 45-50% in 10/2011 with nonischemic nuc - presumed NICM. c. 12/2014 Echo: Sev depressed LV fxn, sev dil LV, mild LVH, mild MR, sev dil LA, mildly reduced RV fxn.   CKD (chronic kidney disease) stage 2, GFR 60-89 ml/min    H/O vasectomy 12/2019   High cholesterol    Hypertension    Morbid obesity ([place])    Nephrolithiasis    OSA on CPAP    Paroxysmal atrial fibrillation ([place])    Presumed NICM    a. 04/2014 Myoview: EF 26%, glob HK, sev glob HK, ? prior infarct;  b. Never cathed 2/2 CKD.   Renal cell carcinoma ([place])    a. s/p Rt robotic assisted partial  converted to radical nephrectomy on 01/2013.   Troponin level elevated    a. 04/2014, 12/2014: felt due to CHF.   Type II diabetes mellitus (HCC)    Ventricular tachycardia (HCC)    a. appropriate ICD therapy 12/2017    Assessment: 45 year old male s/p emergent TEE/cardioversion due to hemodynamic instability 7/27. Patient was anticoagulated with apixaban (last dose 7/27 at ~ 10pm). Given multiorgan failure he continues on heparin. Will need to be aggressive with dosing given recent cardioversion.  -heparin level= 0.7, aPTT= -hg= 13.8    Goal of Therapy:  Heparin level 0.3-0.7 units/ml Aptt 66-103 sec Monitor platelets by anticoagulation protocol: Yes   Plan:   Daily aptt/heparin level  Hildred Laser, PharmD Clinical Pharmacist **Pharmacist phone directory can now be found on [email or web address] ([email or web address]).  Listed under [place].

## 2021-05-03 NOTE — Progress Notes (Signed)
During routine airway suctioning, the patient plugged off due to a mucus plug and his sats dropped to 74%.  NT at bedside asked to call RN.  RT placed patient on an Ambu bag and bag lavaged him to clear the plug.  RN obtained a new ballard suction kit for the patient, plug suctioned out of ETT.  Sats recovered to 100%.  Patient placed back on vent with full vent support.

## 2021-05-04 ENCOUNTER — Inpatient Hospital Stay (HOSPITAL_COMMUNITY): Payer: Medicaid Other

## 2021-05-04 DIAGNOSIS — J9601 Acute respiratory failure with hypoxia: Secondary | ICD-10-CM | POA: Diagnosis not present

## 2021-05-04 DIAGNOSIS — I472 Ventricular tachycardia: Secondary | ICD-10-CM | POA: Diagnosis not present

## 2021-05-04 DIAGNOSIS — J90 Pleural effusion, not elsewhere classified: Secondary | ICD-10-CM | POA: Diagnosis not present

## 2021-05-04 DIAGNOSIS — R079 Chest pain, unspecified: Secondary | ICD-10-CM | POA: Diagnosis not present

## 2021-05-04 DIAGNOSIS — R570 Cardiogenic shock: Secondary | ICD-10-CM | POA: Diagnosis not present

## 2021-05-04 DIAGNOSIS — J9690 Respiratory failure, unspecified, unspecified whether with hypoxia or hypercapnia: Secondary | ICD-10-CM | POA: Diagnosis not present

## 2021-05-04 DIAGNOSIS — J9602 Acute respiratory failure with hypercapnia: Secondary | ICD-10-CM | POA: Diagnosis not present

## 2021-05-04 DIAGNOSIS — I4891 Unspecified atrial fibrillation: Secondary | ICD-10-CM | POA: Diagnosis not present

## 2021-05-04 LAB — CBC WITH DIFFERENTIAL/PLATELET
Abs Immature Granulocytes: 0.05 10*3/uL (ref 0.00–0.07)
Basophils Absolute: 0.1 10*3/uL (ref 0.0–0.1)
Basophils Relative: 1 %
Eosinophils Absolute: 0.2 10*3/uL (ref 0.0–0.5)
Eosinophils Relative: 2 %
HCT: 42.1 % (ref 39.0–52.0)
Hemoglobin: 13.5 g/dL (ref 13.0–17.0)
Immature Granulocytes: 1 %
Lymphocytes Relative: 22 %
Lymphs Abs: 2.1 10*3/uL (ref 0.7–4.0)
MCH: 27.3 pg (ref 26.0–34.0)
MCHC: 32.1 g/dL (ref 30.0–36.0)
MCV: 85.2 fL (ref 80.0–100.0)
Monocytes Absolute: 1.1 10*3/uL — ABNORMAL HIGH (ref 0.1–1.0)
Monocytes Relative: 11 %
Neutro Abs: 5.9 10*3/uL (ref 1.7–7.7)
Neutrophils Relative %: 63 %
Platelets: 185 10*3/uL (ref 150–400)
RBC: 4.94 MIL/uL (ref 4.22–5.81)
RDW: 18.4 % — ABNORMAL HIGH (ref 11.5–15.5)
WBC: 9.4 10*3/uL (ref 4.0–10.5)
nRBC: 0 % (ref 0.0–0.2)

## 2021-05-04 LAB — GLUCOSE, CAPILLARY
Glucose-Capillary: 170 mg/dL — ABNORMAL HIGH (ref 70–99)
Glucose-Capillary: 176 mg/dL — ABNORMAL HIGH (ref 70–99)
Glucose-Capillary: 183 mg/dL — ABNORMAL HIGH (ref 70–99)
Glucose-Capillary: 189 mg/dL — ABNORMAL HIGH (ref 70–99)
Glucose-Capillary: 192 mg/dL — ABNORMAL HIGH (ref 70–99)
Glucose-Capillary: 194 mg/dL — ABNORMAL HIGH (ref 70–99)
Glucose-Capillary: 222 mg/dL — ABNORMAL HIGH (ref 70–99)
Glucose-Capillary: 271 mg/dL — ABNORMAL HIGH (ref 70–99)

## 2021-05-04 LAB — TRIGLYCERIDES: Triglycerides: 148 mg/dL (ref <150)

## 2021-05-04 LAB — COOXEMETRY PANEL
Carboxyhemoglobin: 1 % (ref 0.5–1.5)
Methemoglobin: 1.2 % (ref 0.0–1.5)
O2 Saturation: 76.9 %
Total hemoglobin: 13.5 g/dL (ref 12.0–16.0)

## 2021-05-04 LAB — BASIC METABOLIC PANEL
Anion gap: 10 (ref 5–15)
BUN: 53 mg/dL — ABNORMAL HIGH (ref 6–20)
CO2: 28 mmol/L (ref 22–32)
Calcium: 10 mg/dL (ref 8.9–10.3)
Chloride: 109 mmol/L (ref 98–111)
Creatinine, Ser: 2.04 mg/dL — ABNORMAL HIGH (ref 0.61–1.24)
GFR, Estimated: 40 mL/min — ABNORMAL LOW (ref >60)
Glucose, Bld: 208 mg/dL — ABNORMAL HIGH (ref 70–99)
Potassium: 4.1 mmol/L (ref 3.5–5.1)
Sodium: 147 mmol/L — ABNORMAL HIGH (ref 135–145)

## 2021-05-04 LAB — COMPREHENSIVE METABOLIC PANEL
ALT: 38 U/L (ref 0–44)
AST: 17 U/L (ref 15–41)
Albumin: 2.4 g/dL — ABNORMAL LOW (ref 3.5–5.0)
Alkaline Phosphatase: 44 U/L (ref 38–126)
Anion gap: 8 (ref 5–15)
BUN: 52 mg/dL — ABNORMAL HIGH (ref 6–20)
CO2: 28 mmol/L (ref 22–32)
Calcium: 9.8 mg/dL (ref 8.9–10.3)
Chloride: 109 mmol/L (ref 98–111)
Creatinine, Ser: 2.18 mg/dL — ABNORMAL HIGH (ref 0.61–1.24)
GFR, Estimated: 37 mL/min — ABNORMAL LOW (ref >60)
Glucose, Bld: 198 mg/dL — ABNORMAL HIGH (ref 70–99)
Potassium: 4 mmol/L (ref 3.5–5.1)
Sodium: 145 mmol/L (ref 135–145)
Total Bilirubin: 1 mg/dL (ref 0.3–1.2)
Total Protein: 6.5 g/dL (ref 6.5–8.1)

## 2021-05-04 LAB — HEPARIN LEVEL (UNFRACTIONATED): Heparin Unfractionated: 0.6 IU/mL (ref 0.30–0.70)

## 2021-05-04 LAB — PHOSPHORUS: Phosphorus: 4.7 mg/dL — ABNORMAL HIGH (ref 2.5–4.6)

## 2021-05-04 LAB — APTT: aPTT: 65 seconds — ABNORMAL HIGH (ref 24–36)

## 2021-05-04 MED ORDER — ORAL CARE MOUTH RINSE
15.0000 mL | OROMUCOSAL | Status: DC
  Administered 2021-05-04 – 2021-05-08 (×37): 15 mL via OROMUCOSAL

## 2021-05-04 MED ORDER — ISOSORB DINITRATE-HYDRALAZINE 20-37.5 MG PO TABS
0.5000 | ORAL_TABLET | Freq: Three times a day (TID) | ORAL | Status: DC
  Administered 2021-05-04 – 2021-05-05 (×4): 0.5
  Filled 2021-05-04 (×4): qty 1

## 2021-05-04 MED ORDER — CHLORHEXIDINE GLUCONATE 0.12% ORAL RINSE (MEDLINE KIT)
15.0000 mL | Freq: Two times a day (BID) | OROMUCOSAL | Status: DC
  Administered 2021-05-05 – 2021-05-08 (×7): 15 mL via OROMUCOSAL

## 2021-05-04 MED ORDER — INSULIN ASPART 100 UNIT/ML IJ SOLN
8.0000 [IU] | INTRAMUSCULAR | Status: DC
  Administered 2021-05-04 – 2021-05-08 (×23): 8 [IU] via SUBCUTANEOUS

## 2021-05-04 MED ORDER — POTASSIUM CHLORIDE 20 MEQ PO PACK
40.0000 meq | PACK | Freq: Once | ORAL | Status: AC
  Administered 2021-05-04: 40 meq
  Filled 2021-05-04: qty 2

## 2021-05-04 NOTE — Progress Notes (Signed)
Patient ID: Jeffrey [name], male   DOB: 02/12/1976, 45 y.o.   MRN: [id number]     Advanced Heart Failure Rounding Note  PCP-Cardiologist: Daniel Bensimhon, MD   Subjective:    Creatinine 4.65 => 4.57 => 2.94 => 2.18.  Good UOP now, I/Os net negative 1280 on Lasix gtt 12 mg/hr, CVP now 12.  Weight coming down.   Co-ox 77%.  He remains on dobutamine 4, off NE.  MAP elevated with sedation wean.    Weaning vent, on PSV.   He is in NSR with PACs, on amiodarone 30 currently and heparin gtt.   Echo: EF < 20%, severe LV dilation, moderate RV dysfunction   Objective:   Weight Range: (!) 139 kg Body mass index is 46.59 kg/m.   Vital Signs:   Temp:  [98.5 F (36.9 C)-100.3 F (37.9 C)] 99.6 F (37.6 C) (07/31 0754) Pulse Rate:  [59-103] 95 (07/31 0740) Resp:  [13-32] 25 (07/31 0740) BP: (95-123)/(63-93) 110/93 (07/31 0740) SpO2:  [91 %-100 %] 97 % (07/31 0740) Arterial Line BP: (100-152)/(57-90) 145/83 (07/31 0600) FiO2 (%):  [40 %-50 %] 40 % (07/31 0740) Weight:  [139 kg] 139 kg (07/31 0436) Last BM Date: 04/29/21  Weight change: Filed Weights   05/02/21 0120 05/03/21 0436 05/04/21 0436  Weight: (!) 146.1 kg (!) 143.9 kg (!) 139 kg    Intake/Output:   Intake/Output Summary (Last 24 hours) at 05/04/2021 0817 Last data filed at 05/04/2021 0752 Gross per 24 hour  Intake 3111.03 ml  Output 4830 ml  Net -1718.97 ml      Physical Exam    General: Intubated Neck: Thick, JVP 10 cm, no thyromegaly or thyroid nodule.  Lungs: Clear to auscultation bilaterally with normal respiratory effort. CV: Nondisplaced PMI.  Heart regular S1/S2, no S3/S4, no murmur.  Trace ankle edema.  Abdomen: Soft, nontender, no hepatosplenomegaly, no distention.  Skin: Intact without lesions or rashes.  Neurologic: Wakes up and follows commands.  Extremities: No clubbing or cyanosis.  HEENT: Normal.    Telemetry   NSR 60s-70s with PACs (personally reviewed)   Labs    CBC Recent Labs     05/03/21 0408 05/04/21 0340  WBC 9.6 9.4  NEUTROABS  --  5.9  HGB 13.8 13.5  HCT 40.2 42.1  MCV 80.9 85.2  PLT 192 185   Basic Metabolic Panel Recent Labs    05/03/21 0407 05/03/21 1600 05/04/21 0340  NA 143 147* 145  K 4.1 3.9 4.0  CL 110 113* 109  CO2 23 24 28  GLUCOSE 197* 163* 198*  BUN 53* 52* 52*  CREATININE 2.94* 2.55* 2.18*  CALCIUM 9.6 9.3 9.8  PHOS 3.4  --  4.7*   Liver Function Tests Recent Labs    05/02/21 0245 05/03/21 0407 05/04/21 0340  AST 19  --  17  ALT 61*  --  38  ALKPHOS 42  --  44  BILITOT 1.2  --  1.0  PROT 5.9*  --  6.5  ALBUMIN 2.4*  2.4* 2.3* 2.4*   No results for input(s): LIPASE, AMYLASE in the last 72 hours. Cardiac Enzymes No results for input(s): CKTOTAL, CKMB, CKMBINDEX, TROPONINI in the last 72 hours.  BNP: BNP (last 3 results) Recent Labs    11/21/20 2222 04/29/21 1247  BNP 65.0 1,407.7*    ProBNP (last 3 results) No results for input(s): PROBNP in the last 8760 hours.   D-Dimer No results for input(s): DDIMER in the last 72 hours.   Hemoglobin A1C No results for input(s): HGBA1C in the last 72 hours.  Fasting Lipid Panel Recent Labs    05/04/21 0338  TRIG 148   Thyroid Function Tests No results for input(s): TSH, T4TOTAL, T3FREE, THYROIDAB in the last 72 hours.  Invalid input(s): FREET3   Other results:   Imaging    DG CHEST PORT 1 VIEW  Result Date: 05/03/2021 CLINICAL DATA:  CH V. Vent weaning. EXAM: PORTABLE CHEST 1 VIEW COMPARISON:  May 01, 2021 FINDINGS: The AICD device is stable. An ETT is in adequate position. The NG tube terminates below today's film. No pneumothorax. The left central line is stable. Haziness over the left base identified. Haziness over the right base has resolved. No overt edema. IMPRESSION: 1. Support apparatus as above. 2. Haziness over the left lower lung may represent layering effusion with underlying atelectasis. Recommend attention on follow-up. No overt edema on today's  study. Electronically Signed   By: David  Williams III M.D   On: 05/03/2021 11:55     Medications:     Scheduled Medications:  ALPRAZolam  0.5 mg Per Tube BID   chlorhexidine gluconate (MEDLINE KIT)  15 mL Mouth Rinse BID   Chlorhexidine Gluconate Cloth  6 each Topical Daily   docusate  100 mg Per Tube BID   feeding supplement (PROSource TF)  45 mL Per Tube TID   feeding supplement (VITAL HIGH PROTEIN)  1,000 mL Per Tube Q24H   insulin aspart  0-20 Units Subcutaneous Q4H   insulin aspart  4 Units Subcutaneous Q4H   insulin detemir  50 Units Subcutaneous BID   isosorbide-hydrALAZINE  0.5 tablet Oral TID   mouth rinse  15 mL Mouth Rinse 10 times per day   pantoprazole sodium  40 mg Per Tube Daily   polyethylene glycol  17 g Per Tube Daily   sodium chloride flush  10-40 mL Intracatheter Q12H   sodium chloride flush  3 mL Intravenous Q12H    Infusions:  sodium chloride Stopped (04/30/21 2254)   amiodarone 30 mg/hr (05/04/21 0600)   DOBUTamine 4 mcg/kg/min (05/04/21 0600)   fentaNYL infusion INTRAVENOUS 75 mcg/hr (05/04/21 0600)   furosemide (LASIX) 200 mg in dextrose 5% 100 mL (2mg/mL) infusion 12 mg/hr (05/04/21 0600)   heparin 2,050 Units/hr (05/04/21 0629)   norepinephrine (LEVOPHED) Adult infusion Stopped (05/03/21 0452)   propofol (DIPRIVAN) infusion 15 mcg/kg/min (05/04/21 0605)    PRN Medications: sodium chloride, fentaNYL, nitroGLYCERIN, ondansetron (ZOFRAN) IV, sodium chloride flush, sodium chloride flush  Assessment/Plan   1. Atrial fibrillation: Paroxysmal.  Suspect AF with RVR drove CHF exacerbation.  Patient has been on amiodarone and Eliquis at home, has missed some Eliquis doses. Emergent TEE-guided DCCV 7/27 due to inability to control HR, now back in NSR. - Continue amiodarone gtt 30 mg/hr.  - Continue heparin gtt => eventually back to Eliquis.  - Eventually needs consideration for AF ablation. 2. Acute on chronic systolic CHF/cardiogenic shock: Nonischemic  CMP, exacerbation likely driven by AF.  Echo in 9/21 with EF 20-25%.  Echo this admission with EF < 20%, severe LV dilation, moderate RV dysfunction.  Overnight on 7/27 intubated with pulmonary edema and required addition of NE up to 14.  He is now on dobutamine 4 and off NE, co-ox 77% with CVP down to 12.  Good UOP on Lasix 12 mg/hr.  Creatinine lower 4.65 => 4.57 => 2.94 => 2.18.  - Decrease dobutamine to 2 today.   - Will continue Lasix gtt 12 mg/hr   today. BMET in pm.  - Add Bidil 0.5 tab tid.  3. AKI on CKD stage 3: Has solitary kidney.  Suspect cardiorenal syndrome/low output with AF/RVR in setting of cardiomyopathy.  Creatinine 2.2 => 4.65 => 4.57 => 2.94 => 2.18.  Urinary obstruction likely played a role, doing better now that urology inserted foley catheter.  - Continue dobutamine to maintain CO, gradual wean.  4. OSA 5. Acute hypoxemic respiratory failure: Suspect primarily CHF but has GPCs in sputum => likely respiratory flora.  Afebrile. Improved.  - Now off abx.  - Vent weaning per CCM => hope to extubate today.    CRITICAL CARE Performed by: Loralie Champagne  Total critical care time: 35 minutes  Critical care time was exclusive of separately billable procedures and treating other patients.  Critical care was necessary to treat or prevent imminent or life-threatening deterioration.  Critical care was time spent personally by me on the following activities: development of treatment plan with patient and/or surrogate as well as nursing, discussions with consultants, evaluation of patient's response to treatment, examination of patient, obtaining history from patient or surrogate, ordering and performing treatments and interventions, ordering and review of laboratory studies, ordering and review of radiographic studies, pulse oximetry and re-evaluation of patient's condition.   Length of Stay: McGregor, MD  05/04/2021, 8:17 AM  Advanced Heart Failure Team Pager [phone]  (M-F; 7a - 5p)  Please contact [facility] for night-coverage after hours (5p -7a ) and weekends on [email or web address]

## 2021-05-04 NOTE — Progress Notes (Signed)
ANTICOAGULATION CONSULT NOTE - Follow up [place] for heparin Indication: atrial fibrillation  Allergies  Allergen Reactions   Aspirin Shortness Of Breath, Itching and Rash     Burning sensation (Patient reports he tolerates other NSAIDS)    Bee Venom Hives and Swelling   Lisinopril Cough   Tomato Rash    Patient Measurements: Height: '5\' 8"'$  (172.7 cm) Weight: (!) 139 kg (306 lb 7 oz) IBW/kg (Calculated) : 68.4 Heparin Dosing Weight: 103kg  Vital Signs: Temp: 99.6 F (37.6 C) (07/31 0754) Temp Source: Axillary (07/31 0754) BP: 110/93 (07/31 0740) Pulse Rate: 95 (07/31 0740)  Labs: Recent Labs    05/01/21 1356 05/01/21 1655 05/02/21 0245 05/02/21 0306 05/02/21 1652 05/03/21 0351 05/03/21 0407 05/03/21 0408 05/03/21 0933 05/03/21 1600 05/04/21 0338 05/04/21 0340  HGB   < >  --  13.9  --   --  13.3  --  13.8  --   --   --  13.5  HCT   < >  --  40.7  --   --  39.0  --  40.2  --   --   --  42.1  PLT  --   --  165  --   --   --   --  192  --   --   --  185  APTT  --  69*  --    < > 52*  --   --   --  80*  --   --  65*  HEPARINUNFRC  --  >1.10*  --   --   --   --  0.70  --   --   --  0.60  --   CREATININE  --   --  4.57*  4.58*  --  3.60*  --  2.94*  --   --  2.55*  --  2.18*   < > = values in this interval not displayed.     Estimated Creatinine Clearance: 57.9 mL/min (A) (by C-G formula based on SCr of 2.18 mg/dL (H)).   Medical History: Past Medical History:  Diagnosis Date   Anxiety    Aspirin allergy    Childhood asthma    Chronic systolic CHF (congestive heart failure) ([place])    a. EF 20-25% in 2012. b. EF 45-50% in 10/2011 with nonischemic nuc - presumed NICM. c. 12/2014 Echo: Sev depressed LV fxn, sev dil LV, mild LVH, mild MR, sev dil LA, mildly reduced RV fxn.   CKD (chronic kidney disease) stage 2, GFR 60-89 ml/min    H/O vasectomy 12/2019   High cholesterol    Hypertension    Morbid obesity ([place])    Nephrolithiasis    OSA on CPAP     Paroxysmal atrial fibrillation ([place])    Presumed NICM    a. 04/2014 Myoview: EF 26%, glob HK, sev glob HK, ? prior infarct;  b. Never cathed 2/2 CKD.   Renal cell carcinoma ([place])    a. s/p Rt robotic assisted partial converted to radical nephrectomy on 01/2013.   Troponin level elevated    a. 04/2014, 12/2014: felt due to CHF.   Type II diabetes mellitus (HCC)    Ventricular tachycardia (HCC)    a. appropriate ICD therapy 12/2017    Assessment: 45 year old male s/p emergent TEE/cardioversion due to hemodynamic instability 7/27. Patient was anticoagulated with apixaban (last dose 7/27 at ~ 10pm). Given multiorgan failure he continues on heparin. Will need to be  aggressive with dosing given recent cardioversion.  -heparin level= 0.6, aPTT=65 -hg= 13.8    Goal of Therapy:  Heparin level 0.3-0.7 units/ml Aptt 66-103 sec Monitor platelets by anticoagulation protocol: Yes   Plan:  Continue heparin at 2050 units/hr Daily heparin level, CBC  Hildred Laser, PharmD Clinical Pharmacist **Pharmacist phone directory can now be found on [email or web address] ([email or web address]).  Listed under [place].

## 2021-05-04 NOTE — Progress Notes (Signed)
NAME:  Jeffrey [name], MRN:  [id number], DOB:  [birth year], LOS: 4 ADMISSION DATE:  04/29/2021, CONSULTATION DATE:  7/27 REFERRING MD:  Marigene Ehlers, CHIEF COMPLAINT:  dyspnea   History of Present Illness:  45 year old man with extensive cardiac hx as below presenting with decompensated heart failure in setting of afib.  Cardioverted earlier today but unforuntately worse respiratory distress.  + orthopnea.  Saturating 80s on NRB and RR 40s, Aox3, anxious.  Pertinent  Medical History  NICM with AICD in place HTN R renal cell carcinoma s/p nephrectomy CKD 3a Hx of traumatic intubation  Significant Hospital Events: Including procedures, antibiotic start and stop dates in addition to other pertinent events   7/26 admitted. 7/27 cardioversion, worsening resp distress; intubation. 7/27 Echo with EF less than 20% (global HK) and grade 3 DD.  RV systolic function mod reduced. [id number] foley complication, new catheter placed by urology with good output. 7/29 Continued urine output and creatinine plateau; urology consult: leave foley in for 7 days 7/30 Failed SBT 16/8 PSV 7/31 weaning on 16/8 PSV, diuresing, lots of secretions, more awake, Coox 76%  Interim History / Subjective:   7/31 weaning on 16/8 PSV, diuresing, lots of secretions, more awake, Coox 76%  Objective   Blood pressure (!) 110/93, pulse 95, temperature 99.6 F (37.6 C), temperature source Axillary, resp. rate (!) 25, height '5\' 8"'$  (1.727 m), weight (!) 139 kg, SpO2 97 %. CVP:  [6 mmHg-19 mmHg] 12 mmHg  Vent Mode: PSV;CPAP FiO2 (%):  [40 %-50 %] 40 % Set Rate:  [18 bmp] 18 bmp Vt Set:  [550 mL] 550 mL PEEP:  [8 cmH20] 8 cmH20 Pressure Support:  [16 cmH20] 16 cmH20 Plateau Pressure:  [20 cmH20-28 cmH20] 23 cmH20   Intake/Output Summary (Last 24 hours) at 05/04/2021 1009 Last data filed at 05/04/2021 0900 Gross per 24 hour  Intake 2797.8 ml  Output 4770 ml  Net -1972.2 ml   Filed Weights   05/02/21 0120 05/03/21 0436 05/04/21  0436  Weight: (!) 146.1 kg (!) 143.9 kg (!) 139 kg    Examination:  General:  In bed on vent HENT: NCAT ETT in place PULM: CTA B, vent supported breathing CV: RRR, no mgr GI: BS+, soft, nontender MSK: normal bulk and tone Neuro: sedated on vent   7/31 CXR > ICD, CVL, ETT all in place, cardiomegally, bilateral air space disease and effusions   Resolved Hospital Problem list     Assessment & Plan:  Decompensated systolic heart failure Cardiogenic shock Continue aggressive diuresis Continue dobutamine, monitor COOX Wean off levophed for MAP > 65 Per heart failure team  Atrial fibrillation Tele Amidarone Heparin infusion Per heart failure  Acute respiratory failure with hypoxemia due to acute pulmonary edema Do not suspect CAP Monitor off antibiotics Full mechanical vent support VAP prevention Daily WUA/SBT  AKI > per renal Continue diuresis Monitor BMET and UOP Replace electrolytes as needed  Urethral injury from foley catheter Keep foley in place 7 days  Morbid obesity  Counseling after extubation  DM2 with hyperglycemia SSI Increase tube feeding coverage  Need for sedation for mechanical ventilation RASS target -1 to -2 Continue propofol and fentanyl per PAD protocol   Best Practice (right click and "Reselect all SmartList Selections" daily)   Diet/type: tubefeeds DVT prophylaxis: systemic heparin GI prophylaxis: PPI Lines: Central line Foley:  Yes, and it is still needed Code Status:  full code Last date of multidisciplinary goals of care discussion [per primary]  Labs  CBC: Recent Labs  Lab 04/29/21 1247 04/30/21 1754 05/01/21 0107 05/02/21 0245 05/03/21 0351 05/03/21 0408 05/04/21 0340  WBC 9.6  --   --  10.9*  --  9.6 9.4  NEUTROABS  --   --   --   --   --   --  5.9  HGB 15.0   < > 15.3 13.9 13.3 13.8 13.5  HCT 45.3   < > 45.0 40.7 39.0 40.2 42.1  MCV 83.4  --   --  81.6  --  80.9 85.2  PLT 221  --   --  165  --  192 185   <  > = values in this interval not displayed.    Basic Metabolic Panel: Recent Labs  Lab 04/30/21 0555 04/30/21 1754 05/01/21 0334 05/01/21 1356 05/02/21 0245 05/02/21 1652 05/03/21 0351 05/03/21 0407 05/03/21 1600 05/04/21 0340  NA 134*   < > 139   < > 136  135 141 143 143 147* 145  K 4.4   < > 4.3   < > 3.4*  3.3* 3.5 4.1 4.1 3.9 4.0  CL 105  --  106   < > 103  100 106  --  110 113* 109  CO2 16*  --  24   < > 21*  21* 24  --  '23 24 28  '$ GLUCOSE 322*  --  144*   < > 292*  295* 196*  --  197* 163* 198*  BUN 44*  --  54*   < > 63*  60* 58*  --  53* 52* 52*  CREATININE 2.20*  --  4.65*   < > 4.57*  4.58* 3.60*  --  2.94* 2.55* 2.18*  CALCIUM 9.3  --  9.3   < > 9.3  9.4 9.4  --  9.6 9.3 9.8  MG 2.1  --  2.3  --   --   --   --   --   --   --   PHOS  --   --   --   --  4.7*  --   --  3.4  --  4.7*   < > = values in this interval not displayed.   GFR: Estimated Creatinine Clearance: 57.9 mL/min (A) (by C-G formula based on SCr of 2.18 mg/dL (H)). Recent Labs  Lab 04/29/21 1247 04/30/21 0955 04/30/21 1741 05/01/21 0334 05/02/21 0245 05/03/21 0408 05/04/21 0340  PROCALCITON  --   --  0.47  --   --   --   --   WBC 9.6  --   --   --  10.9* 9.6 9.4  LATICACIDVEN  --  2.7* 3.0* 2.4*  --   --   --     Liver Function Tests: Recent Labs  Lab 04/30/21 0555 05/02/21 0245 05/03/21 0407 05/04/21 0340  AST 43* 19  --  17  ALT 135* 61*  --  38  ALKPHOS 55 42  --  44  BILITOT 0.7 1.2  --  1.0  PROT 6.8 5.9*  --  6.5  ALBUMIN 3.2* 2.4*  2.4* 2.3* 2.4*   No results for input(s): LIPASE, AMYLASE in the last 168 hours. No results for input(s): AMMONIA in the last 168 hours.  ABG    Component Value Date/Time   PHART 7.511 (H) 05/03/2021 0351   PCO2ART 31.2 (L) 05/03/2021 0351   PO2ART 116 (H) 05/03/2021 0351   HCO3 24.9 05/03/2021 0351   TCO2 26  05/03/2021 0351   ACIDBASEDEF 1.0 05/01/2021 0107   O2SAT 76.9 05/04/2021 0348     Coagulation Profile: No results for  input(s): INR, PROTIME in the last 168 hours.  Cardiac Enzymes: No results for input(s): CKTOTAL, CKMB, CKMBINDEX, TROPONINI in the last 168 hours.  HbA1C: HbA1c, POC (prediabetic range)  Date/Time Value Ref Range Status  06/24/2020 09:20 AM 7.5 (A) 5.7 - 6.4 % Final  03/19/2020 10:22 AM 8.1 (A) 5.7 - 6.4 % Final   HbA1c, POC (controlled diabetic range)  Date/Time Value Ref Range Status  06/24/2020 09:20 AM 7.5 (A) 0.0 - 7.0 % Final  03/19/2020 10:22 AM 8.1 (A) 0.0 - 7.0 % Final   HbA1c POC (<> result, manual entry)  Date/Time Value Ref Range Status  06/24/2020 09:20 AM 7.5 4.0 - 5.6 % Final  03/19/2020 10:22 AM 8.1 4.0 - 5.6 % Final   Hgb A1c MFr Bld  Date/Time Value Ref Range Status  04/30/2021 05:55 AM 9.4 (H) 4.8 - 5.6 % Final    Comment:    (NOTE)         Prediabetes: 5.7 - 6.4         Diabetes: >6.4         Glycemic control for adults with diabetes: <7.0   10/28/2020 09:32 AM 8.6 (H) 4.8 - 5.6 % Final    Comment:             Prediabetes: 5.7 - 6.4          Diabetes: >6.4          Glycemic control for adults with diabetes: <7.0     CBG: Recent Labs  Lab 05/03/21 1548 05/03/21 1925 05/04/21 0000 05/04/21 0347 05/04/21 0740  GLUCAP 151* 207* 183* 170* 222*    Critical care time: 35 minutes     Roselie Awkward, MD  PCCM Pager: [phone] Cell: [phone] After 7:00 pm call Elink  [phone]

## 2021-05-05 ENCOUNTER — Inpatient Hospital Stay (HOSPITAL_COMMUNITY): Payer: Medicaid Other

## 2021-05-05 DIAGNOSIS — J90 Pleural effusion, not elsewhere classified: Secondary | ICD-10-CM | POA: Diagnosis not present

## 2021-05-05 DIAGNOSIS — I517 Cardiomegaly: Secondary | ICD-10-CM | POA: Diagnosis not present

## 2021-05-05 DIAGNOSIS — J9601 Acute respiratory failure with hypoxia: Secondary | ICD-10-CM | POA: Diagnosis not present

## 2021-05-05 DIAGNOSIS — R570 Cardiogenic shock: Secondary | ICD-10-CM | POA: Diagnosis not present

## 2021-05-05 DIAGNOSIS — I4891 Unspecified atrial fibrillation: Secondary | ICD-10-CM | POA: Diagnosis not present

## 2021-05-05 DIAGNOSIS — I509 Heart failure, unspecified: Secondary | ICD-10-CM | POA: Diagnosis not present

## 2021-05-05 LAB — CBC WITH DIFFERENTIAL/PLATELET
Abs Immature Granulocytes: 0.04 10*3/uL (ref 0.00–0.07)
Basophils Absolute: 0.1 10*3/uL (ref 0.0–0.1)
Basophils Relative: 1 %
Eosinophils Absolute: 0.2 10*3/uL (ref 0.0–0.5)
Eosinophils Relative: 2 %
HCT: 41.2 % (ref 39.0–52.0)
Hemoglobin: 13.1 g/dL (ref 13.0–17.0)
Immature Granulocytes: 0 %
Lymphocytes Relative: 23 %
Lymphs Abs: 2.4 10*3/uL (ref 0.7–4.0)
MCH: 27.2 pg (ref 26.0–34.0)
MCHC: 31.8 g/dL (ref 30.0–36.0)
MCV: 85.5 fL (ref 80.0–100.0)
Monocytes Absolute: 1.3 10*3/uL — ABNORMAL HIGH (ref 0.1–1.0)
Monocytes Relative: 12 %
Neutro Abs: 6.3 10*3/uL (ref 1.7–7.7)
Neutrophils Relative %: 62 %
Platelets: 215 10*3/uL (ref 150–400)
RBC: 4.82 MIL/uL (ref 4.22–5.81)
RDW: 18.3 % — ABNORMAL HIGH (ref 11.5–15.5)
WBC: 10.2 10*3/uL (ref 4.0–10.5)
nRBC: 0 % (ref 0.0–0.2)

## 2021-05-05 LAB — COMPREHENSIVE METABOLIC PANEL
ALT: 30 U/L (ref 0–44)
AST: 19 U/L (ref 15–41)
Albumin: 2.3 g/dL — ABNORMAL LOW (ref 3.5–5.0)
Alkaline Phosphatase: 41 U/L (ref 38–126)
Anion gap: 9 (ref 5–15)
BUN: 57 mg/dL — ABNORMAL HIGH (ref 6–20)
CO2: 31 mmol/L (ref 22–32)
Calcium: 10.3 mg/dL (ref 8.9–10.3)
Chloride: 109 mmol/L (ref 98–111)
Creatinine, Ser: 1.98 mg/dL — ABNORMAL HIGH (ref 0.61–1.24)
GFR, Estimated: 41 mL/min — ABNORMAL LOW (ref >60)
Glucose, Bld: 191 mg/dL — ABNORMAL HIGH (ref 70–99)
Potassium: 3.7 mmol/L (ref 3.5–5.1)
Sodium: 149 mmol/L — ABNORMAL HIGH (ref 135–145)
Total Bilirubin: 1.1 mg/dL (ref 0.3–1.2)
Total Protein: 6.9 g/dL (ref 6.5–8.1)

## 2021-05-05 LAB — GLUCOSE, CAPILLARY
Glucose-Capillary: 169 mg/dL — ABNORMAL HIGH (ref 70–99)
Glucose-Capillary: 184 mg/dL — ABNORMAL HIGH (ref 70–99)
Glucose-Capillary: 172 mg/dL — ABNORMAL HIGH (ref 70–99)
Glucose-Capillary: 146 mg/dL — ABNORMAL HIGH (ref 70–99)
Glucose-Capillary: 180 mg/dL — ABNORMAL HIGH (ref 70–99)
Glucose-Capillary: 225 mg/dL — ABNORMAL HIGH (ref 70–99)
Glucose-Capillary: 214 mg/dL — ABNORMAL HIGH (ref 70–99)

## 2021-05-05 LAB — POCT I-STAT 7, (LYTES, BLD GAS, ICA,H+H)
Acid-Base Excess: 7 mmol/L — ABNORMAL HIGH (ref 0.0–2.0)
Bicarbonate: 33.1 mmol/L — ABNORMAL HIGH (ref 20.0–28.0)
Calcium, Ion: 1.38 mmol/L (ref 1.15–1.40)
HCT: 47 % (ref 39.0–52.0)
Hemoglobin: 16 g/dL (ref 13.0–17.0)
O2 Saturation: 98 %
Patient temperature: 100
Potassium: 3.6 mmol/L (ref 3.5–5.1)
Sodium: 151 mmol/L — ABNORMAL HIGH (ref 135–145)
TCO2: 35 mmol/L — ABNORMAL HIGH (ref 22–32)
pCO2 arterial: 50.6 mmHg — ABNORMAL HIGH (ref 32.0–48.0)
pH, Arterial: 7.426 (ref 7.350–7.450)
pO2, Arterial: 111 mmHg — ABNORMAL HIGH (ref 83.0–108.0)

## 2021-05-05 LAB — BASIC METABOLIC PANEL
Anion gap: 9 (ref 5–15)
BUN: 61 mg/dL — ABNORMAL HIGH (ref 6–20)
CO2: 30 mmol/L (ref 22–32)
Calcium: 10.4 mg/dL — ABNORMAL HIGH (ref 8.9–10.3)
Chloride: 112 mmol/L — ABNORMAL HIGH (ref 98–111)
Creatinine, Ser: 2.02 mg/dL — ABNORMAL HIGH (ref 0.61–1.24)
GFR, Estimated: 40 mL/min — ABNORMAL LOW (ref >60)
Glucose, Bld: 198 mg/dL — ABNORMAL HIGH (ref 70–99)
Potassium: 3.9 mmol/L (ref 3.5–5.1)
Sodium: 151 mmol/L — ABNORMAL HIGH (ref 135–145)

## 2021-05-05 LAB — COOXEMETRY PANEL
Carboxyhemoglobin: 0.9 % (ref 0.5–1.5)
Methemoglobin: 1.1 % (ref 0.0–1.5)
O2 Saturation: 76.8 %
Total hemoglobin: 17.2 g/dL — ABNORMAL HIGH (ref 12.0–16.0)

## 2021-05-05 LAB — MAGNESIUM: Magnesium: 2.3 mg/dL (ref 1.7–2.4)

## 2021-05-05 LAB — HEPARIN LEVEL (UNFRACTIONATED): Heparin Unfractionated: 0.43 IU/mL (ref 0.30–0.70)

## 2021-05-05 MED ORDER — FENTANYL CITRATE (PF) 100 MCG/2ML IJ SOLN
50.0000 ug | INTRAMUSCULAR | Status: DC | PRN
  Administered 2021-05-05 (×2): 50 ug via INTRAVENOUS
  Filled 2021-05-05 (×2): qty 2

## 2021-05-05 MED ORDER — FENTANYL 2500MCG IN NS 250ML (10MCG/ML) PREMIX INFUSION
0.0000 ug/h | INTRAVENOUS | Status: DC
Start: 2021-05-05 — End: 2021-05-09
  Administered 2021-05-05: 25 ug/h via INTRAVENOUS
  Administered 2021-05-06: 50 ug/h via INTRAVENOUS
  Filled 2021-05-05 (×2): qty 250

## 2021-05-05 MED ORDER — APIXABAN 5 MG PO TABS
5.0000 mg | ORAL_TABLET | Freq: Two times a day (BID) | ORAL | Status: DC
  Administered 2021-05-05 – 2021-05-09 (×9): 5 mg
  Filled 2021-05-05 (×9): qty 1

## 2021-05-05 MED ORDER — LINEZOLID 600 MG/300ML IV SOLN
600.0000 mg | Freq: Two times a day (BID) | INTRAVENOUS | Status: DC
  Administered 2021-05-05 – 2021-05-07 (×4): 600 mg via INTRAVENOUS
  Filled 2021-05-05 (×4): qty 300

## 2021-05-05 MED ORDER — ACETAMINOPHEN 160 MG/5ML PO SOLN
650.0000 mg | Freq: Four times a day (QID) | ORAL | Status: DC | PRN
  Administered 2021-05-05 – 2021-05-06 (×2): 650 mg
  Filled 2021-05-05 (×2): qty 20.3

## 2021-05-05 MED ORDER — POTASSIUM CHLORIDE 20 MEQ PO PACK
40.0000 meq | PACK | Freq: Once | ORAL | Status: AC
  Administered 2021-05-05: 40 meq
  Filled 2021-05-05: qty 2

## 2021-05-05 MED ORDER — ISOSORB DINITRATE-HYDRALAZINE 20-37.5 MG PO TABS
1.0000 | ORAL_TABLET | Freq: Three times a day (TID) | ORAL | Status: DC
  Administered 2021-05-05 – 2021-05-08 (×11): 1
  Filled 2021-05-05 (×12): qty 1

## 2021-05-05 MED ORDER — FREE WATER
200.0000 mL | Freq: Four times a day (QID) | Status: DC
  Administered 2021-05-05 – 2021-05-06 (×4): 200 mL

## 2021-05-05 MED ORDER — SODIUM CHLORIDE 0.9 % IV SOLN
2.0000 g | Freq: Three times a day (TID) | INTRAVENOUS | Status: DC
  Administered 2021-05-05 – 2021-05-06 (×2): 2 g via INTRAVENOUS
  Filled 2021-05-05 (×2): qty 2

## 2021-05-05 MED ORDER — FENTANYL CITRATE (PF) 100 MCG/2ML IJ SOLN
50.0000 ug | INTRAMUSCULAR | Status: DC | PRN
Start: 2021-05-05 — End: 2021-05-09
  Administered 2021-05-05: 100 ug via INTRAVENOUS
  Filled 2021-05-05: qty 2

## 2021-05-05 MED ORDER — DEXMEDETOMIDINE HCL IN NACL 400 MCG/100ML IV SOLN
0.4000 ug/kg/h | INTRAVENOUS | Status: DC
  Administered 2021-05-05: 0.8 ug/kg/h via INTRAVENOUS
  Administered 2021-05-05 (×2): 1.2 ug/kg/h via INTRAVENOUS
  Administered 2021-05-05: 0.4 ug/kg/h via INTRAVENOUS
  Administered 2021-05-05: 1.2 ug/kg/h via INTRAVENOUS
  Administered 2021-05-06: 0.9 ug/kg/h via INTRAVENOUS
  Administered 2021-05-06 (×2): 1.2 ug/kg/h via INTRAVENOUS
  Administered 2021-05-06 (×2): 0.8 ug/kg/h via INTRAVENOUS
  Administered 2021-05-06: 1.2 ug/kg/h via INTRAVENOUS
  Administered 2021-05-07 (×2): 0.9 ug/kg/h via INTRAVENOUS
  Administered 2021-05-07: 0.8 ug/kg/h via INTRAVENOUS
  Administered 2021-05-07: 0.9 ug/kg/h via INTRAVENOUS
  Administered 2021-05-08 (×2): 0.8 ug/kg/h via INTRAVENOUS
  Filled 2021-05-05 (×16): qty 100
  Filled 2021-05-05: qty 200
  Filled 2021-05-05 (×3): qty 100

## 2021-05-05 MED ORDER — FENTANYL CITRATE (PF) 100 MCG/2ML IJ SOLN
50.0000 ug | INTRAMUSCULAR | Status: DC | PRN
  Administered 2021-05-05: 50 ug via INTRAVENOUS
  Filled 2021-05-05: qty 2

## 2021-05-05 NOTE — Progress Notes (Signed)
RT NOTE: patient placed on CPAP/PSV of 12/8 at 0755.  Currently tolerating well.  Will continue to monitor.

## 2021-05-05 NOTE — Progress Notes (Signed)
Patient ID: Jeffrey [name], male   DOB: 1976-07-29, 45 y.o.   MRN: [id number]     Advanced Heart Failure Rounding Note  PCP-Cardiologist: Glori Bickers, MD   Subjective:    He is intubated and sedated on Propofol and Fentanyl.  He resonds to noxious stimuli and follows commands intermittently.  Creatinine 4.65 => 4.57 => 2.94 => 2.18 =>2.04 =>1.98.  Net I/O x24 hours is -539.8 with 4L of UOP on Lasix at 12 mg/hr.  CVP now 6. Weight down to 303 -- loss of ~30 lbs since last clinic visit   Co-ox 77%.  He remains on dobutamine 2 and is off pressors.    Weaning vent -- now on PS at 40% FiO2, 12/8  He is in NSR with PACs, on amiodarone 30 currently and heparin gtt.   Echo: EF < 20%, severe LV dilation, moderate RV dysfunction   Objective:   Weight Range: (!) 137.8 kg Body mass index is 46.19 kg/m.   Vital Signs:   Temp:  [99 F (37.2 C)-100.5 F (38.1 C)] 100 F (37.8 C) (08/01 0500) Pulse Rate:  [75-93] 75 (08/01 0756) Resp:  [18-32] 32 (08/01 0900) BP: (100-141)/(68-103) 140/95 (08/01 0900) SpO2:  [79 %-100 %] 96 % (08/01 0900) Arterial Line BP: (112-158)/(56-91) 151/83 (08/01 0900) FiO2 (%):  [40 %] 40 % (08/01 0756) Weight:  [137.8 kg] 137.8 kg (08/01 0100) Last BM Date: 04/29/21  Weight change: Filed Weights   05/03/21 0436 05/04/21 0436 05/05/21 0100  Weight: (!) 143.9 kg (!) 139 kg (!) 137.8 kg    Intake/Output:   Intake/Output Summary (Last 24 hours) at 05/05/2021 0946 Last data filed at 05/05/2021 0859 Gross per 24 hour  Intake 2957.31 ml  Output 4015 ml  Net -1057.69 ml       Physical Exam    General: Intubated and sedated Neck: Thick, JVP 10 cm, no thyromegaly or thyroid nodule.  Lungs: Clear to auscultation bilaterally with normal respiratory effort. CV: Nondisplaced PMI.  Heart regular S1/S2, no S3/S4, no murmur.  Trace ankle edema.  Abdomen: Soft, nontender, no hepatosplenomegaly, no distention.  Skin: Intact without lesions or rashes.   Neurologic: Wakes up and follows commands.  Extremities: No clubbing or cyanosis.  HEENT: OGT and ETT in place.  Normocephalic and atraumatic    Telemetry   NSR 60s-70s with PACs (personally reviewed)   Labs    CBC Recent Labs    05/04/21 0340 05/05/21 0508 05/05/21 0535  WBC 9.4 10.2  --   NEUTROABS 5.9 6.3  --   HGB 13.5 13.1 16.0  HCT 42.1 41.2 47.0  MCV 85.2 85.5  --   PLT 185 215  --     Basic Metabolic Panel Recent Labs    05/03/21 0407 05/03/21 1600 05/04/21 0340 05/04/21 1549 05/05/21 0508 05/05/21 0535  NA 143   < > 145 147* 149* 151*  K 4.1   < > 4.0 4.1 3.7 3.6  CL 110   < > 109 109 109  --   CO2 23   < > _0 --   GLUCOSE 197*   < > 198* 208* 191*  --   BUN 53*   < > 52* 53* 57*  --   CREATININE 2.94*   < > 2.18* 2.04* 1.98*  --   CALCIUM 9.6   < > 9.8 10.0 10.3  --   PHOS 3.4  --  4.7*  --   --   --    < > =  values in this interval not displayed.    Liver Function Tests Recent Labs    05/04/21 0340 05/05/21 0508  AST 17 19  ALT 38 30  ALKPHOS 44 41  BILITOT 1.0 1.1  PROT 6.5 6.9  ALBUMIN 2.4* 2.3*    No results for input(s): LIPASE, AMYLASE in the last 72 hours. Cardiac Enzymes No results for input(s): CKTOTAL, CKMB, CKMBINDEX, TROPONINI in the last 72 hours.  BNP: BNP (last 3 results) Recent Labs    11/21/20 2222 04/29/21 1247  BNP 65.0 1,407.7*     ProBNP (last 3 results) No results for input(s): PROBNP in the last 8760 hours.   D-Dimer No results for input(s): DDIMER in the last 72 hours. Hemoglobin A1C No results for input(s): HGBA1C in the last 72 hours.  Fasting Lipid Panel Recent Labs    05/04/21 0338  TRIG 148    Thyroid Function Tests No results for input(s): TSH, T4TOTAL, T3FREE, THYROIDAB in the last 72 hours.  Invalid input(s): FREET3   Other results:   Imaging    DG Chest Port 1 View  Result Date: 05/05/2021 CLINICAL DATA:  ET tube present, acute CHF EXAM: PORTABLE CHEST 1 VIEW  COMPARISON:  05/04/2021 FINDINGS: Support devices are unchanged. Cardiomegaly. Bilateral airspace disease and layering effusions. No real change since prior study. IMPRESSION: No interval change in bilateral airspace disease and layering effusions. Electronically Signed   By: Rolm Baptise M.D.   On: 05/05/2021 08:23     Medications:     Scheduled Medications:  ALPRAZolam  0.5 mg Per Tube BID   chlorhexidine gluconate (MEDLINE KIT)  15 mL Mouth Rinse BID   Chlorhexidine Gluconate Cloth  6 each Topical Daily   docusate  100 mg Per Tube BID   feeding supplement (PROSource TF)  45 mL Per Tube TID   feeding supplement (VITAL HIGH PROTEIN)  1,000 mL Per Tube Q24H   insulin aspart  0-20 Units Subcutaneous Q4H   insulin aspart  8 Units Subcutaneous Q4H   insulin detemir  50 Units Subcutaneous BID   isosorbide-hydrALAZINE  0.5 tablet Per Tube TID   mouth rinse  15 mL Mouth Rinse 10 times per day   pantoprazole sodium  40 mg Per Tube Daily   polyethylene glycol  17 g Per Tube Daily   sodium chloride flush  10-40 mL Intracatheter Q12H   sodium chloride flush  3 mL Intravenous Q12H    Infusions:  sodium chloride Stopped (04/30/21 2254)   amiodarone 30 mg/hr (05/05/21 0600)   DOBUTamine 2 mcg/kg/min (05/05/21 0600)   fentaNYL infusion INTRAVENOUS 50 mcg/hr (05/05/21 0600)   furosemide (LASIX) 200 mg in dextrose 5% 100 mL (18m/mL) infusion 12 mg/hr (05/05/21 0702)   heparin 2,050 Units/hr (05/05/21 0857)   norepinephrine (LEVOPHED) Adult infusion Stopped (05/03/21 0452)   propofol (DIPRIVAN) infusion 25 mcg/kg/min (05/05/21 0600)    PRN Medications: sodium chloride, fentaNYL, nitroGLYCERIN, ondansetron (ZOFRAN) IV, sodium chloride flush, sodium chloride flush  Assessment/Plan   1. Atrial fibrillation: Paroxysmal - Suspect AF with RVR drove CHF exacerbation.  Patient has been on amiodarone and Eliquis at home, has missed some Eliquis doses. Emergent TEE-guided DCCV 7/27 due to inability to  control HR, now back in NSR. - Continue amiodarone gtt 30 mg/hr with eventual transition back to PO (100 mg daily at home) - Continue heparin gtt => eventually back to Eliquis.  - Eventually needs consideration for AF ablation.  2. Acute on chronic systolic CHF/cardiogenic shock - Nonischemic CMP,  exacerbation likely driven by AF.  Echo in 9/21 with EF 20-25%.  Echo this admission with EF < 20%, severe LV dilation, moderate RV dysfunction.  Overnight on 7/27 intubated with pulmonary edema and required addition of NE up to 14.  He is now on dobutamine 2 and off NE, co-ox 77% with CVP down to 6.  Good UOP on Lasix 12 mg/hr.  Creatinine improving - Continue dobutamine  - Will continue Lasix today to remove as much volume as possible.  If he becomes hypotensive, can stop Lasix drip  - Continue BiDil 0.5 mg TID. Consider resuming Delene Loll, Jardiance and spironolactone closer to discharge pending clinical course  - No beta blocker in the setting of shock   3. AKI on CKD stage 3  - Has solitary kidney.   - Suspect cardiorenal syndrome/low output with AF/RVR in setting of cardiomyopathy.  Creatinine 2.2 => 4.65 => 4.57 => 2.94 => 2.18 =>2.   - Baseline creatinine is 1.5-1.7  - Urinary obstruction likely played a role, doing better now that urology inserted foley catheter.  4L of UOP x24 hours  - Continue dobutamine and wean gradually   4. OSA - CPAP after extubation   5. Acute hypoxemic respiratory failure - Suspect primarily CHF, but has GPCs in sputum => likely respiratory flora.  Afebrile. Improved.  - Now off abx.  - Vent weaning per CCM => hope to extubate today.   Darcella Gasman, NP  05/05/2021, 9:46 AM  Advanced Heart Failure Team Pager [phone] (M-F; 7a - 5p)  Please contact [facility] for night-coverage after hours (5p -7a ) and weekends on [email or web address]   Agree with above.    He remains intubated. CCM weaning vent and sedation. Minimally responsive.   CVP 6. Co-ox 77% on DBA 2.  Off NE.  Weight down 35 pounds. Remains in NSR   General:  Intubated/sedated  HEENT: normal + ETT Neck: supple. JVP 6 Carotids 2+ bilat; no bruits. No lymphadenopathy or thryomegaly appreciated. Cor: PMI nondisplaced. Regular rate & rhythm. No rubs, gallops or murmurs. Lungs: clear Abdomen: obese soft, nontender, nondistended. No hepatosplenomegaly. No bruits or masses. Good bowel sounds. Extremities: no cyanosis, clubbing, rash, edema Neuro: intubated sedated   Remains in NSR. Volume status much improved - can stop lasix gtt once extubated. Continue IV amio. Switch heparin to Eliquis.  Increase bidil to 1 tab tid.   D/w CCM. Hopefully can extubate today.   CRITICAL CARE Performed by: Glori Bickers  Total critical care time: 45 minutes  Critical care time was exclusive of separately billable procedures and treating other patients.  Critical care was necessary to treat or prevent imminent or life-threatening deterioration.  Critical care was time spent personally by me (independent of midlevel providers or residents) on the following activities: development of treatment plan with patient and/or surrogate as well as nursing, discussions with consultants, evaluation of patient's response to treatment, examination of patient, obtaining history from patient or surrogate, ordering and performing treatments and interventions, ordering and review of laboratory studies, ordering and review of radiographic studies, pulse oximetry and re-evaluation of patient's condition.  Glori Bickers, MD  10:20 AM

## 2021-05-05 NOTE — Progress Notes (Signed)
ANTICOAGULATION CONSULT NOTE - Follow up [facility] for heparin Indication: atrial fibrillation  Allergies  Allergen Reactions   Aspirin Shortness Of Breath, Itching and Rash     Burning sensation (Patient reports he tolerates other NSAIDS)    Bee Venom Hives and Swelling   Lisinopril Cough   Tomato Rash    Patient Measurements: Height: '5\' 8"'$  (172.7 cm) Weight: (!) 137.8 kg (303 lb 12.7 oz) IBW/kg (Calculated) : 68.4 Heparin Dosing Weight: 103kg  Vital Signs: Temp: 100 F (37.8 C) (08/01 0500) Temp Source: Axillary (08/01 0500) BP: 140/95 (08/01 0900) Pulse Rate: 75 (08/01 0756)  Labs: Recent Labs    05/02/21 1652 05/03/21 0351 05/03/21 0407 05/03/21 0408 05/03/21 0933 05/03/21 1600 05/04/21 0338 05/04/21 0340 05/04/21 1549 05/05/21 0508 05/05/21 0535  HGB  --    < >  --  13.8  --   --   --  13.5  --  13.1 16.0  HCT  --    < >  --  40.2  --   --   --  42.1  --  41.2 47.0  PLT  --   --   --  192  --   --   --  185  --  215  --   APTT 52*  --   --   --  80*  --   --  65*  --   --   --   HEPARINUNFRC  --   --  0.70  --   --   --  0.60  --   --  0.43  --   CREATININE 3.60*  --  2.94*  --   --    < >  --  2.18* 2.04* 1.98*  --    < > = values in this interval not displayed.     Estimated Creatinine Clearance: 63.4 mL/min (A) (by C-G formula based on SCr of 1.98 mg/dL (H)).   Medical History: Past Medical History:  Diagnosis Date   Anxiety    Aspirin allergy    Childhood asthma    Chronic systolic CHF (congestive heart failure) ([place])    a. EF 20-25% in 2012. b. EF 45-50% in 10/2011 with nonischemic nuc - presumed NICM. c. 12/2014 Echo: Sev depressed LV fxn, sev dil LV, mild LVH, mild MR, sev dil LA, mildly reduced RV fxn.   CKD (chronic kidney disease) stage 2, GFR 60-89 ml/min    H/O vasectomy 12/2019   High cholesterol    Hypertension    Morbid obesity ([place])    Nephrolithiasis    OSA on CPAP    Paroxysmal atrial fibrillation ([place])     Presumed NICM    a. 04/2014 Myoview: EF 26%, glob HK, sev glob HK, ? prior infarct;  b. Never cathed 2/2 CKD.   Renal cell carcinoma ([place])    a. s/p Rt robotic assisted partial converted to radical nephrectomy on 01/2013.   Troponin level elevated    a. 04/2014, 12/2014: felt due to CHF.   Type II diabetes mellitus (HCC)    Ventricular tachycardia (HCC)    a. appropriate ICD therapy 12/2017    Assessment: 45 year old male s/p emergent TEE/cardioversion due to hemodynamic instability 7/27. Patient was anticoagulated with apixaban (last dose 7/27 at ~ 10pm). Given multiorgan failure he continues on heparin. Will need to be aggressive with dosing given recent cardioversion.   Heparin level therapeutic at 0.43, CBC stable.  Goal of Therapy:  Heparin level 0.3-0.7 units/ml Monitor platelets by anticoagulation protocol: Yes   Plan:  Continue heparin at 2050 units/hr Daily heparin level, CBC   Arrie Senate, PharmD, BCPS, [facility] Clinical Pharmacist Please check AMION for all [place] numbers 05/05/2021

## 2021-05-05 NOTE — Progress Notes (Signed)
PCCM INTERVAL PROGRESS NOTE  Patient now spiking fevers up to 102F and having thick secretions. Concern he has developed pneumonia. Will send respiratory culture and start empiric antibiotics.    Georgann Housekeeper, AGACNP-BC [facility]  See Amion for personal pager PCCM on call pager [phone] until 7pm. Please call [facility] 7p-7a. [phone]  05/05/2021 5:22 PM

## 2021-05-05 NOTE — Progress Notes (Signed)
Pharmacy Antibiotic Note  Jeffrey [name] is a 45 y.o. male admitted on 04/29/2021 and now with fevers and thick secretions concerning for PNA. Pharmacy has been consulted for Vancomycin + Cefepime dosing.  The patient is noted to have resolving AKI (BL 1.5-2, peak 4.65 on 7/28) - SCr down 1.98. Noted history of R-renal carcinoma s/p nephrectomy, discussed with CCM and will substitute Zyvox instead of Vancomycin.  Plan: - Start Zyvox 600 mg IV every 12 hours - Start Cefepime 2g IV every 8 hours for now given improvement in renal fxn - Consider narrowing as cultures are finalized - noted staph aureus in resp cultures - Will continue to follow renal function, culture results, LOT, and antibiotic de-escalation plans   Height: '5\' 8"'$  (172.7 cm) Weight: (!) 137.8 kg (303 lb 12.7 oz) IBW/kg (Calculated) : 68.4  Temp (24hrs), Avg:100.2 F (37.9 C), Min:99 F (37.2 C), Max:101.2 F (38.4 C)  Recent Labs  Lab 04/29/21 1247 04/30/21 0555 04/30/21 0955 04/30/21 1741 05/01/21 0334 05/01/21 1356 05/02/21 0245 05/02/21 1652 05/03/21 0407 05/03/21 0408 05/03/21 1600 05/04/21 0340 05/04/21 1549 05/05/21 0508  WBC 9.6  --   --   --   --   --  10.9*  --   --  9.6  --  9.4  --  10.2  CREATININE 1.99*   < >  --   --  4.65*   < > 4.57*  4.58*   < > 2.94*  --  2.55* 2.18* 2.04* 1.98*  LATICACIDVEN  --   --  2.7* 3.0* 2.4*  --   --   --   --   --   --   --   --   --    < > = values in this interval not displayed.    Estimated Creatinine Clearance: 63.4 mL/min (A) (by C-G formula based on SCr of 1.98 mg/dL (H)).    Allergies  Allergen Reactions   Aspirin Shortness Of Breath, Itching and Rash     Burning sensation (Patient reports he tolerates other NSAIDS)    Bee Venom Hives and Swelling   Lisinopril Cough   Tomato Rash    Antimicrobials this admission: Cefepime 7/28 >> 7/28; restart 8/1 Zyvox 7/28 >> 7/28; restart 8/1 Rocephin 7/29 x 1  Dose adjustments this  admission: N/a  Microbiology results: 7/26 Flu/COVID >> neg 7/27 MRSA PCR >> neg 7/31 RCx (TA) >> rare staph aureus (pending sens)  Thank you for allowing pharmacy to be a part of this patient's care.  Alycia Rossetti, PharmD, BCPS Clinical Pharmacist Clinical phone for 05/05/2021: [phone] 05/05/2021 6:21 PM   **Pharmacist phone directory can now be found on [email or web address] ([email or web address]).  Listed under [place].

## 2021-05-05 NOTE — Progress Notes (Signed)
NAME:  Jeffrey [name], MRN:  [id number], DOB:  [birth year], LOS: 5 ADMISSION DATE:  04/29/2021, CONSULTATION DATE:  7/27 REFERRING MD:  Marigene Ehlers, CHIEF COMPLAINT:  dyspnea   History of Present Illness:  45 year old man with extensive cardiac hx as below presenting with decompensated heart failure in setting of afib.  Cardioverted earlier today but unforuntately worse respiratory distress.  + orthopnea.  Saturating 80s on NRB and RR 40s, Aox3, anxious.  Pertinent  Medical History  NICM with AICD in place HTN R renal cell carcinoma s/p nephrectomy CKD 3a Hx of traumatic intubation  Significant Hospital Events: Including procedures, antibiotic start and stop dates in addition to other pertinent events   7/26 admitted. 7/27 cardioversion, worsening resp distress; intubation. 7/27 Echo with EF less than 20% (global HK) and grade 3 DD.  RV systolic function mod reduced. [id number] foley complication, new catheter placed by urology with good output. 7/29 Continued urine output and creatinine plateau; urology consult: leave foley in for 7 days 7/30 Failed SBT 16/8 PSV 7/31 weaning on 16/8 PSV, diuresing, lots of secretions, more awake, Coox 76% 8/1 attempted SAT SBT this morning.  Still not awake enough.  Significant amount of secretions  Interim History / Subjective:   Attempt SBT SAT this morning.  Still not awake enough for liberation from ventilator.  Significant secretions  Objective   Blood pressure (!) 141/79, pulse 87, temperature 100 F (37.8 C), temperature source Axillary, resp. rate (!) 27, height '5\' 8"'$  (1.727 m), weight (!) 137.8 kg, SpO2 95 %. CVP:  [5 mmHg-20 mmHg] 9 mmHg  Vent Mode: PRVC FiO2 (%):  [40 %] 40 % Set Rate:  [18 bmp] 18 bmp Vt Set:  [550 mL] 550 mL PEEP:  [5 cmH20-8 cmH20] 5 cmH20 Pressure Support:  [12 cmH20] 12 cmH20 Plateau Pressure:  [20 cmH20-23 cmH20] 20 cmH20   Intake/Output Summary (Last 24 hours) at 05/05/2021 1048 Last data filed at 05/05/2021 1000 Gross  per 24 hour  Intake 2835.59 ml  Output 3840 ml  Net -1004.41 ml   Filed Weights   05/03/21 0436 05/04/21 0436 05/05/21 0100  Weight: (!) 143.9 kg (!) 139 kg (!) 137.8 kg    Examination:  General: Obese male young intubated on mechanical vent support HENT: NCAT, sclera clear, endotracheal tube in place, tracks appropriately PULM: Bilateral mechanically supported breath sounds CV: Regular rhythm, s1 s2  GI: soft nt nd MSK: normal bulk and tone  Neuro: alert following commands   8/1: Bilateral infiltrates, pulmonary edema, small effusion bilaterally. The patient's images have been independently reviewed by me.    Resolved Hospital Problem list     Assessment & Plan:   Decompensated systolic heart failure Cardiogenic shock Continue diuresis Continue dobutamine, monitor COOX Wean off of Levophed. Management per heart failure team.  Atrial fibrillation Telemetry, amiodarone, heparin infusion  Acute respiratory failure with hypoxemia due to acute pulmonary edema Bilateral pleural effusions Do not suspect CAP Monitor off antibiotics Continue full mechanical vent support VAP prophylaxis Daily wake-up assessment SAT SAT as tolerated. Not able to extubate this morning due to mental status and significant amount of secretions.  AKI > per renal BMET and UOP Urine output  Urethral injury from foley catheter Keep foley for now  Morbid obesity  OP follow up and counseling   DM2 with hyperglycemia SSI + CBGs   Need for sedation for mechanical ventilation PAD guidelines  Switch to precedex Holding fentanyl    Best Practice (right click and "  Reselect all SmartList Selections" daily)   Diet/type: tubefeeds DVT prophylaxis: systemic heparin GI prophylaxis: PPI Lines: Central line Foley:  Yes, and it is still needed Code Status:  full code Last date of multidisciplinary goals of care discussion [per primary]  Labs   CBC: Recent Labs  Lab 04/29/21 1247  04/30/21 1754 05/02/21 0245 05/03/21 0351 05/03/21 0408 05/04/21 0340 05/05/21 0508 05/05/21 0535  WBC 9.6  --  10.9*  --  9.6 9.4 10.2  --   NEUTROABS  --   --   --   --   --  5.9 6.3  --   HGB 15.0   < > 13.9 13.3 13.8 13.5 13.1 16.0  HCT 45.3   < > 40.7 39.0 40.2 42.1 41.2 47.0  MCV 83.4  --  81.6  --  80.9 85.2 85.5  --   PLT 221  --  165  --  192 185 215  --    < > = values in this interval not displayed.    Basic Metabolic Panel: Recent Labs  Lab 04/30/21 0555 04/30/21 1754 05/01/21 0334 05/01/21 1356 05/02/21 0245 05/02/21 1652 05/03/21 0407 05/03/21 1600 05/04/21 0340 05/04/21 1549 05/05/21 0508 05/05/21 0535  NA 134*   < > 139   < > 136  135   < > 143 147* 145 147* 149* 151*  K 4.4   < > 4.3   < > 3.4*  3.3*   < > 4.1 3.9 4.0 4.1 3.7 3.6  CL 105  --  106   < > 103  100   < > 110 113* 109 109 109  --   CO2 16*  --  24   < > 21*  21*   < > '23 24 28 28 31  '$ --   GLUCOSE 322*  --  144*   < > 292*  295*   < > 197* 163* 198* 208* 191*  --   BUN 44*  --  54*   < > 63*  60*   < > 53* 52* 52* 53* 57*  --   CREATININE 2.20*  --  4.65*   < > 4.57*  4.58*   < > 2.94* 2.55* 2.18* 2.04* 1.98*  --   CALCIUM 9.3  --  9.3   < > 9.3  9.4   < > 9.6 9.3 9.8 10.0 10.3  --   MG 2.1  --  2.3  --   --   --   --   --   --   --   --   --   PHOS  --   --   --   --  4.7*  --  3.4  --  4.7*  --   --   --    < > = values in this interval not displayed.   GFR: Estimated Creatinine Clearance: 63.4 mL/min (A) (by C-G formula based on SCr of 1.98 mg/dL (H)). Recent Labs  Lab 04/30/21 0955 04/30/21 1741 05/01/21 0334 05/02/21 0245 05/03/21 0408 05/04/21 0340 05/05/21 0508  PROCALCITON  --  0.47  --   --   --   --   --   WBC  --   --   --  10.9* 9.6 9.4 10.2  LATICACIDVEN 2.7* 3.0* 2.4*  --   --   --   --     Liver Function Tests: Recent Labs  Lab 04/30/21 0555 05/02/21 0245 05/03/21 0407 05/04/21 0340  05/05/21 0508  AST 43* 19  --  17 19  ALT 135* 61*  --  38 30   ALKPHOS 55 42  --  44 41  BILITOT 0.7 1.2  --  1.0 1.1  PROT 6.8 5.9*  --  6.5 6.9  ALBUMIN 3.2* 2.4*  2.4* 2.3* 2.4* 2.3*   No results for input(s): LIPASE, AMYLASE in the last 168 hours. No results for input(s): AMMONIA in the last 168 hours.  ABG    Component Value Date/Time   PHART 7.426 05/05/2021 0535   PCO2ART 50.6 (H) 05/05/2021 0535   PO2ART 111 (H) 05/05/2021 0535   HCO3 33.1 (H) 05/05/2021 0535   TCO2 35 (H) 05/05/2021 0535   ACIDBASEDEF 1.0 05/01/2021 0107   O2SAT 98.0 05/05/2021 0535     Coagulation Profile: No results for input(s): INR, PROTIME in the last 168 hours.  Cardiac Enzymes: No results for input(s): CKTOTAL, CKMB, CKMBINDEX, TROPONINI in the last 168 hours.  HbA1C: HbA1c, POC (prediabetic range)  Date/Time Value Ref Range Status  06/24/2020 09:20 AM 7.5 (A) 5.7 - 6.4 % Final  03/19/2020 10:22 AM 8.1 (A) 5.7 - 6.4 % Final   HbA1c, POC (controlled diabetic range)  Date/Time Value Ref Range Status  06/24/2020 09:20 AM 7.5 (A) 0.0 - 7.0 % Final  03/19/2020 10:22 AM 8.1 (A) 0.0 - 7.0 % Final   HbA1c POC (<> result, manual entry)  Date/Time Value Ref Range Status  06/24/2020 09:20 AM 7.5 4.0 - 5.6 % Final  03/19/2020 10:22 AM 8.1 4.0 - 5.6 % Final   Hgb A1c MFr Bld  Date/Time Value Ref Range Status  04/30/2021 05:55 AM 9.4 (H) 4.8 - 5.6 % Final    Comment:    (NOTE)         Prediabetes: 5.7 - 6.4         Diabetes: >6.4         Glycemic control for adults with diabetes: <7.0   10/28/2020 09:32 AM 8.6 (H) 4.8 - 5.6 % Final    Comment:             Prediabetes: 5.7 - 6.4          Diabetes: >6.4          Glycemic control for adults with diabetes: <7.0     CBG: Recent Labs  Lab 05/04/21 1919 05/04/21 2049 05/04/21 2356 05/05/21 0510 05/05/21 0806  GLUCAP 176* 189* 194* 169* 184*    This patient is critically ill with multiple organ system failure; which, requires frequent high complexity decision making, assessment, support,  evaluation, and titration of therapies. This was completed through the application of advanced monitoring technologies and extensive interpretation of multiple databases. During this encounter critical care time was devoted to patient care services described in this note for 35 minutes.  Garner Nash, DO [facility] 05/05/2021 10:48 AM

## 2021-05-05 NOTE — Progress Notes (Signed)
Called by nursing staff for 13 beats NSVT  Order written for BMEt and [place] NP-C 5:09 PM

## 2021-05-05 NOTE — Progress Notes (Signed)
RT NOTE: patient placed back on full support settings, per MD, due to patient having increased RR and increased WOB while trying to wean.  Peep decreased to 5 as well.  Tolerating well.  Will continue to monitor.

## 2021-05-06 ENCOUNTER — Encounter (HOSPITAL_COMMUNITY): Payer: Medicaid Other | Admitting: Internal Medicine

## 2021-05-06 ENCOUNTER — Inpatient Hospital Stay (HOSPITAL_COMMUNITY): Payer: Medicaid Other

## 2021-05-06 DIAGNOSIS — I5023 Acute on chronic systolic (congestive) heart failure: Secondary | ICD-10-CM | POA: Diagnosis not present

## 2021-05-06 DIAGNOSIS — R570 Cardiogenic shock: Secondary | ICD-10-CM | POA: Diagnosis not present

## 2021-05-06 DIAGNOSIS — J811 Chronic pulmonary edema: Secondary | ICD-10-CM | POA: Diagnosis not present

## 2021-05-06 DIAGNOSIS — N170 Acute kidney failure with tubular necrosis: Secondary | ICD-10-CM

## 2021-05-06 DIAGNOSIS — I480 Paroxysmal atrial fibrillation: Secondary | ICD-10-CM

## 2021-05-06 DIAGNOSIS — J90 Pleural effusion, not elsewhere classified: Secondary | ICD-10-CM | POA: Diagnosis not present

## 2021-05-06 DIAGNOSIS — R0602 Shortness of breath: Secondary | ICD-10-CM | POA: Diagnosis not present

## 2021-05-06 DIAGNOSIS — N1831 Chronic kidney disease, stage 3a: Secondary | ICD-10-CM | POA: Diagnosis not present

## 2021-05-06 DIAGNOSIS — I517 Cardiomegaly: Secondary | ICD-10-CM | POA: Diagnosis not present

## 2021-05-06 DIAGNOSIS — J9811 Atelectasis: Secondary | ICD-10-CM | POA: Diagnosis not present

## 2021-05-06 DIAGNOSIS — J9601 Acute respiratory failure with hypoxia: Secondary | ICD-10-CM | POA: Diagnosis not present

## 2021-05-06 DIAGNOSIS — R079 Chest pain, unspecified: Secondary | ICD-10-CM | POA: Diagnosis not present

## 2021-05-06 LAB — GLUCOSE, CAPILLARY
Glucose-Capillary: 194 mg/dL — ABNORMAL HIGH (ref 70–99)
Glucose-Capillary: 212 mg/dL — ABNORMAL HIGH (ref 70–99)
Glucose-Capillary: 315 mg/dL — ABNORMAL HIGH (ref 70–99)
Glucose-Capillary: 196 mg/dL — ABNORMAL HIGH (ref 70–99)
Glucose-Capillary: 147 mg/dL — ABNORMAL HIGH (ref 70–99)
Glucose-Capillary: 161 mg/dL — ABNORMAL HIGH (ref 70–99)

## 2021-05-06 LAB — CBC
HCT: 42 % (ref 39.0–52.0)
Hemoglobin: 13.1 g/dL (ref 13.0–17.0)
MCH: 27.2 pg (ref 26.0–34.0)
MCHC: 31.2 g/dL (ref 30.0–36.0)
MCV: 87.3 fL (ref 80.0–100.0)
Platelets: 223 10*3/uL (ref 150–400)
RBC: 4.81 MIL/uL (ref 4.22–5.81)
RDW: 18 % — ABNORMAL HIGH (ref 11.5–15.5)
WBC: 11 10*3/uL — ABNORMAL HIGH (ref 4.0–10.5)
nRBC: 0 % (ref 0.0–0.2)

## 2021-05-06 LAB — BASIC METABOLIC PANEL
Anion gap: 9 (ref 5–15)
BUN: 65 mg/dL — ABNORMAL HIGH (ref 6–20)
CO2: 31 mmol/L (ref 22–32)
Calcium: 10.6 mg/dL — ABNORMAL HIGH (ref 8.9–10.3)
Chloride: 111 mmol/L (ref 98–111)
Creatinine, Ser: 2.21 mg/dL — ABNORMAL HIGH (ref 0.61–1.24)
GFR, Estimated: 36 mL/min — ABNORMAL LOW (ref >60)
Glucose, Bld: 206 mg/dL — ABNORMAL HIGH (ref 70–99)
Potassium: 3.6 mmol/L (ref 3.5–5.1)
Sodium: 151 mmol/L — ABNORMAL HIGH (ref 135–145)

## 2021-05-06 LAB — CULTURE, RESPIRATORY W GRAM STAIN

## 2021-05-06 LAB — POCT I-STAT 7, (LYTES, BLD GAS, ICA,H+H)
Acid-Base Excess: 5 mmol/L — ABNORMAL HIGH (ref 0.0–2.0)
Bicarbonate: 28.8 mmol/L — ABNORMAL HIGH (ref 20.0–28.0)
Calcium, Ion: 1.35 mmol/L (ref 1.15–1.40)
HCT: 38 % — ABNORMAL LOW (ref 39.0–52.0)
Hemoglobin: 12.9 g/dL — ABNORMAL LOW (ref 13.0–17.0)
O2 Saturation: 95 %
Patient temperature: 99.9
Potassium: 3.4 mmol/L — ABNORMAL LOW (ref 3.5–5.1)
Sodium: 154 mmol/L — ABNORMAL HIGH (ref 135–145)
TCO2: 30 mmol/L (ref 22–32)
pCO2 arterial: 41.3 mmHg (ref 32.0–48.0)
pH, Arterial: 7.454 — ABNORMAL HIGH (ref 7.350–7.450)
pO2, Arterial: 73 mmHg — ABNORMAL LOW (ref 83.0–108.0)

## 2021-05-06 LAB — COOXEMETRY PANEL
Carboxyhemoglobin: 0.8 % (ref 0.5–1.5)
Methemoglobin: 0.8 % (ref 0.0–1.5)
O2 Saturation: 66.7 %
Total hemoglobin: 17.7 g/dL — ABNORMAL HIGH (ref 12.0–16.0)

## 2021-05-06 LAB — MAGNESIUM: Magnesium: 2.3 mg/dL (ref 1.7–2.4)

## 2021-05-06 MED ORDER — VITAL AF 1.2 CAL PO LIQD
1000.0000 mL | ORAL | Status: DC
  Administered 2021-05-06 – 2021-05-08 (×4): 1000 mL

## 2021-05-06 MED ORDER — SODIUM CHLORIDE 0.9 % IV SOLN
2.0000 g | Freq: Two times a day (BID) | INTRAVENOUS | Status: DC
  Administered 2021-05-06 – 2021-05-07 (×2): 2 g via INTRAVENOUS
  Filled 2021-05-06 (×2): qty 2

## 2021-05-06 MED ORDER — POTASSIUM CHLORIDE 20 MEQ PO PACK
40.0000 meq | PACK | Freq: Once | ORAL | Status: AC
  Administered 2021-05-06: 40 meq
  Filled 2021-05-06: qty 2

## 2021-05-06 MED ORDER — FREE WATER
250.0000 mL | Status: DC
  Administered 2021-05-06 – 2021-05-11 (×26): 250 mL

## 2021-05-06 NOTE — Progress Notes (Signed)
RT called to patient room due to patient having a decrease in sats.  Upon arrival patient's sats were 89% on FIO2 of 85%.  Increased FIO2 to 100%.  MD made aware and PEEP increased back to 8.  Per MD sat goal is >90%.  Will continue to monitor.

## 2021-05-06 NOTE — Progress Notes (Signed)
Nutrition Follow-up  DOCUMENTATION CODES:   Morbid obesity  INTERVENTION:   Tube Feeding via OG: Change to Vital AF 1.2 at 65 ml/hr Pro-Source TF 45 mL TID Provides 150 g of protein, 1992 kcals, 1264 mL of free water  Recommend increasing free water given persistent hypernatremia  Pt might benefit from insulin drip, at least short term, to better manage blood sugars  NUTRITION DIAGNOSIS:   Inadequate oral intake related to acute illness as evidenced by NPO status.  Being addressed via TF  GOAL:   Patient will meet greater than or equal to 90% of their needs  Met  MONITOR:   Vent status, TF tolerance, Weight trends, Labs  REASON FOR ASSESSMENT:   Consult, Ventilator Enteral/tube feeding initiation and management  ASSESSMENT:   45 yo male admitted with decompensated heart failure in setting of afib, worsening respiratory failure requiring intubation. PMH includes CHF, CKD III, solitary kidney secondary to R renal cell carcinoma  7/26 Admitted 7/27 Cardioversion, worsening resp status requiring intubation  Pt remains on vent support; difficulty weaning Sedated with precedex, off propofol  Tolerating Vital High Protein at 60 ml/hr   Free water at 200 mL q 8 hours started  Current wt 137.4 kg; admitted wt 153 kg  Labs: sodium 151 (H), CBGs 194-315 (ICU goal 140-180) Meds: ss novolog, novolog q 4 hours, levemir   Diet Order:   Diet Order             Diet NPO time specified  Diet effective now                   EDUCATION NEEDS:   Not appropriate for education at this time  Skin:  Skin Assessment: Reviewed RN Assessment  Last BM:  7/26  Height:   Ht Readings from Last 1 Encounters:  05/01/21 '5\' 8"'  (1.727 m)    Weight:   Wt Readings from Last 1 Encounters:  05/06/21 (!) 137.4 kg     BMI:  Body mass index is 46.06 kg/m.  Estimated Nutritional Needs:   Kcal:  1800-2000 kcals  Protein:  140-175 g  Fluid:  1.8 L   Kerman Passey  MS, RDN, LDN, CNSC Registered Dietitian III Clinical Nutrition RD Pager and On-Call Pager Number Located in [place]

## 2021-05-06 NOTE — Progress Notes (Addendum)
Patient ID: Jeffrey [name], male   DOB: 30-Jun-1976, 45 y.o.   MRN: [id number]     Advanced Heart Failure Rounding Note  PCP-Cardiologist: Glori Bickers, MD   Subjective:   04/30/21 Echo: EF < 20%, severe LV dilation, moderate RV dysfunction 05/05/21 Bidil increased to 1 tab tid. Started on eliquis. Febrile 101.2 . Started on zyvox+ cefepime   05/05/21 Sputum Cx: rate staph aureus  Remains intubated FiO2 75%. Frothy sputum.   Creatinine 4.65 peaked > today 2.2    Continues to diuresed with lasix drip. Weight down another pound 302- down over 35 pounds.   Co-ox 67%.  He remains on dobutamine 2   Following commands on vent.    Objective:   Weight Range: (!) 137.4 kg Body mass index is 46.06 kg/m.   Vital Signs:   Temp:  [98.5 F (36.9 C)-101.2 F (38.4 C)] 98.5 F (36.9 C) (08/02 0308) Pulse Rate:  [75-87] 79 (08/02 0400) Resp:  [17-32] 23 (08/02 0700) BP: (116-171)/(76-100) 123/89 (08/02 0700) SpO2:  [89 %-99 %] 92 % (08/02 0700) Arterial Line BP: (141-185)/(69-98) 171/90 (08/02 0700) FiO2 (%):  [40 %-90 %] 85 % (08/02 0308) Weight:  [137.4 kg] 137.4 kg (08/02 0431) Last BM Date: 04/29/21  Weight change: Filed Weights   05/04/21 0436 05/05/21 0100 05/06/21 0431  Weight: (!) 139 kg (!) 137.8 kg (!) 137.4 kg    Intake/Output:   Intake/Output Summary (Last 24 hours) at 05/06/2021 0737 Last data filed at 05/06/2021 7628 Gross per 24 hour  Intake 4117.28 ml  Output 4275 ml  Net -157.72 ml      Physical Exam   CVP 6 General:  Intubated HEENT: ETT  Neck: supple. JVP difficult to assess.  Carotids 2+ bilat; no bruits. No lymphadenopathy or thryomegaly appreciated. LIJ Cor: PMI nondisplaced. Irregular Regular rate & rhythm. No rubs, gallops or murmurs. Lungs: decreased.  Abdomen: obese, soft, nontender, nondistended. No hepatosplenomegaly. No bruits or masses. Good bowel sounds. Extremities: no cyanosis, clubbing, rash, edema Neuro: Awake on vent. MAEx3 GU: foley    Telemetry   NSR 60-80s occasional PACs.    Labs    CBC Recent Labs    05/04/21 0340 05/05/21 0508 05/05/21 0535 05/06/21 0324 05/06/21 0330  WBC 9.4 10.2  --   --  11.0*  NEUTROABS 5.9 6.3  --   --   --   HGB 13.5 13.1   < > 12.9* 13.1  HCT 42.1 41.2   < > 38.0* 42.0  MCV 85.2 85.5  --   --  87.3  PLT 185 215  --   --  223   < > = values in this interval not displayed.   Basic Metabolic Panel Recent Labs    05/04/21 0340 05/04/21 1549 05/05/21 1715 05/06/21 0324 05/06/21 0330  NA 145   < > 151* 154* 151*  K 4.0   < > 3.9 3.4* 3.6  CL 109   < > 112*  --  111  CO2 28   < > 30  --  31  GLUCOSE 198*   < > 198*  --  206*  BUN 52*   < > 61*  --  65*  CREATININE 2.18*   < > 2.02*  --  2.21*  CALCIUM 9.8   < > 10.4*  --  10.6*  MG  --   --  2.3  --   --   PHOS 4.7*  --   --   --   --    < > =  values in this interval not displayed.   Liver Function Tests Recent Labs    05/04/21 0340 05/05/21 0508  AST 17 19  ALT 38 30  ALKPHOS 44 41  BILITOT 1.0 1.1  PROT 6.5 6.9  ALBUMIN 2.4* 2.3*   No results for input(s): LIPASE, AMYLASE in the last 72 hours. Cardiac Enzymes No results for input(s): CKTOTAL, CKMB, CKMBINDEX, TROPONINI in the last 72 hours.  BNP: BNP (last 3 results) Recent Labs    11/21/20 2222 04/29/21 1247  BNP 65.0 1,407.7*    ProBNP (last 3 results) No results for input(s): PROBNP in the last 8760 hours.   D-Dimer No results for input(s): DDIMER in the last 72 hours. Hemoglobin A1C No results for input(s): HGBA1C in the last 72 hours.  Fasting Lipid Panel Recent Labs    05/04/21 0338  TRIG 148   Thyroid Function Tests No results for input(s): TSH, T4TOTAL, T3FREE, THYROIDAB in the last 72 hours.  Invalid input(s): FREET3   Other results:   Imaging    No results found.   Medications:     Scheduled Medications:  ALPRAZolam  0.5 mg Per Tube BID   apixaban  5 mg Per Tube BID   chlorhexidine gluconate (MEDLINE KIT)   15 mL Mouth Rinse BID   Chlorhexidine Gluconate Cloth  6 each Topical Daily   docusate  100 mg Per Tube BID   feeding supplement (PROSource TF)  45 mL Per Tube TID   feeding supplement (VITAL HIGH PROTEIN)  1,000 mL Per Tube Q24H   free water  200 mL Per Tube Q6H   insulin aspart  0-20 Units Subcutaneous Q4H   insulin aspart  8 Units Subcutaneous Q4H   insulin detemir  50 Units Subcutaneous BID   isosorbide-hydrALAZINE  1 tablet Per Tube TID   mouth rinse  15 mL Mouth Rinse 10 times per day   pantoprazole sodium  40 mg Per Tube Daily   polyethylene glycol  17 g Per Tube Daily   sodium chloride flush  10-40 mL Intracatheter Q12H   sodium chloride flush  3 mL Intravenous Q12H    Infusions:  sodium chloride Stopped (04/30/21 2254)   amiodarone 30 mg/hr (05/06/21 0600)   ceFEPime (MAXIPIME) IV 2 g (05/06/21 0604)   dexmedetomidine (PRECEDEX) IV infusion 1.2 mcg/kg/hr (05/06/21 0605)   DOBUTamine 2 mcg/kg/min (05/06/21 0600)   fentaNYL infusion INTRAVENOUS 100 mcg/hr (05/06/21 0600)   furosemide (LASIX) 200 mg in dextrose 5% 100 mL (71m/mL) infusion 12 mg/hr (05/06/21 0600)   linezolid (ZYVOX) IV Stopped (05/05/21 2107)   propofol (DIPRIVAN) infusion Stopped (05/05/21 1038)    PRN Medications: sodium chloride, acetaminophen (TYLENOL) oral liquid 160 mg/5 mL, fentaNYL (SUBLIMAZE) injection, fentaNYL (SUBLIMAZE) injection, nitroGLYCERIN, ondansetron (ZOFRAN) IV, sodium chloride flush, sodium chloride flush  Assessment/Plan   1. Atrial fibrillation: Paroxysmal - Suspect AF with RVR drove CHF exacerbation.  Patient has been on amiodarone and Eliquis at home, has missed some Eliquis doses. Emergent TEE-guided DCCV 7/27 due to inability to control HR - Maintaining NSR.  - Continue amiodarone gtt 30 mg/hr with eventual transition back to PO (100 mg daily at home) - Continue to Eliquis.  - Eventually needs consideration for AF ablation.  2. 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  Echo in 9/21 with EF 20-25%.  Echo this admission with EF < 20%, severe LV dilation, moderate RV dysfunction.   7/27 intubated with pulmonary edema and required  addition of NE. Off NE - CO-OX 67%. Cut back dobutamine 1 mcg.  - CVP 6. Continue lasix drip.   -  Continue BiDil  1 TID. Consider resuming Delene Loll, Jardiance and spironolactone closer to discharge pending clinical course  - No beta blocker in the setting of shock   3. AKI on CKD stage 3  - Has solitary kidney.   - Suspect cardiorenal syndrome/low output with AF/RVR in setting of cardiomyopathy.  Creatinine peaked 4.65 => 2.2 today  - Baseline creatinine is 1.5-1.7  - Urinary obstruction likely played a role, doing better now that urology inserted foley catheter. - Cut back dobutamine to 1 mcg.   4. OSA - CPAP after extubation   5. Acute hypoxemic respiratory failure - Suspect primarily CHF, but has GPCs in sputum => likely respiratory flora. - 05/05/21 Failed wean. Febrile. Started on zyvox.+ cefepime.  Sputum cx -  rare staph - Vent weaning per CCM  6. Hypernatremia  Sodium 150. Receiving free water   Darrick Grinder, NP  05/06/2021, 7:37 AM  Advanced Heart Failure Team Pager [phone] (M-F; 7a - 5p)  Please contact [facility] for night-coverage after hours (5p -7a ) and weekends on [email or web address]   Agree with above.  Remains intubated. Decompensated last night with worsening respiratory failure and frothy sputum. Now on Fio2 75%. CXR concerning for RLL process (my read) On DBA at 2. Co-ox 67%. Volume status and renal function continue to improve. CVP 6  General:  on vent. Awake and following commands HEENT: normal + ETT Neck: supple. no JVD. Carotids 2+ bilat; no bruits. No lymphadenopathy or thryomegaly appreciated. Cor: PMI nondisplaced. Regular rate & rhythm. No rubs, gallops or murmurs. Lungs: clear Abdomen: obese soft, nontender, nondistended. No  hepatosplenomegaly. No bruits or masses. Good bowel sounds. Extremities: no cyanosis, clubbing, rash, edema Neuro: alert & orientedx3, cranial nerves grossly intact. moves all 4 extremities w/o difficulty. Affect pleasant  Respiratory status worse despite well controlled volume status. CXR concerning for RLL process. Now on IV abx. Can consider bronch as needed .  With worsening hypernatremia would stop lasix gtt. Continue free water.   CRITICAL CARE Performed by: Glori Bickers  Total critical care time: 35 minutes  Critical care time was exclusive of separately billable procedures and treating other patients.  Critical care was necessary to treat or prevent imminent or life-threatening deterioration.  Critical care was time spent personally by me (independent of midlevel providers or residents) on the following activities: development of treatment plan with patient and/or surrogate as well as nursing, discussions with consultants, evaluation of patient's response to treatment, examination of patient, obtaining history from patient or surrogate, ordering and performing treatments and interventions, ordering and review of laboratory studies, ordering and review of radiographic studies, pulse oximetry and re-evaluation of patient's condition.  Glori Bickers, MD  8:50 AM

## 2021-05-06 NOTE — Progress Notes (Addendum)
NAME:  Jeffrey [name], MRN:  [id number], DOB:  Apr 27, 1976, LOS: 6 ADMISSION DATE:  04/29/2021, CONSULTATION DATE:  04/30/2021 REFERRING MD:  Marigene Ehlers CHIEF COMPLAINT: Dyspnea  History of Present Illness:  45 year old man with extensive cardiac hx as below presenting with decompensated heart failure in setting of afib.  Cardioverted earlier today but unforuntately worse respiratory distress.  + orthopnea.  Saturating 80s on NRB and RR 40s, Aox3, anxious.  Pertinent  Medical History  NICM with AICD in place, Hx Afib, Hx ventricular tachycardia, HTN, T2DM, R renal cell carcinoma s/p nephrectomy in 2014, CKD 3a, OSA on CPAP, Hx of traumatic intubation.  Significant Hospital Events: Including procedures, antibiotic start and stop dates in addition to other pertinent events   7/26 admitted. 7/27 cardioversion, worsening resp distress; intubation. 7/27 Echo with EF less than 20% (global HK) and grade 3 DD.  RV systolic function mod reduced. 0/93 foley complication, new catheter placed by urology with good output. 7/29 Continued urine output and creatinine plateau; urology consult: leave foley in for 7 days 7/30 Failed SBT 16/8 PSV 7/31 weaning on 16/8 PSV, diuresing, lots of secretions, more awake, Coox 76% 8/1 attempted SAT SBT this morning.  Still not awake enough. Started precedex. Fever up to 101.2 with thick secretions. Respiratory culture with rare Staph Aureus. Started on linezolid and cefepime. Eliquis restarted. 8/2 Desaturated to 89% on FIO2 of 85%. Increased FIO2 100% and PEEP 8.  Interim History / Subjective:  Continues to have thick secretions this morning. Afebrile this am. Mother at bedside.  Pt desaturated to 89% on FIO2 of 85% this morning. Increased FIO2 100% and PEEP 8.  Objective   Blood pressure 120/85, pulse 85, temperature 99.5 F (37.5 C), temperature source Oral, resp. rate (!) 23, height '5\' 8"'  (1.727 m), weight (!) 137.4 kg, SpO2 92 %. CVP:  [4 mmHg-11 mmHg] 6 mmHg  Vent  Mode: PRVC FiO2 (%):  [40 %-90 %] (P) 75 % Set Rate:  [18 bmp] 18 bmp Vt Set:  [550 mL] 550 mL PEEP:  [5 cmH20] 5 cmH20 Plateau Pressure:  [20 cmH20-23 cmH20] 22 cmH20   Intake/Output Summary (Last 24 hours) at 05/06/2021 0936 Last data filed at 05/06/2021 0855 Gross per 24 hour  Intake 4285.41 ml  Output 4060 ml  Net 225.41 ml   Filed Weights   05/04/21 0436 05/05/21 0100 05/06/21 0431  Weight: (!) 139 kg (!) 137.8 kg (!) 137.4 kg    Examination: General: Chronically ill appearing, intubated lying in bed. Intermittently opens eyes.  HENT: NCAT, ETT in place. No secretions during examination.  Lungs: CTAB, no crackles or wheezes.  Cardiovascular: Normal rate and rhythm. No murmur or gallops.  Abdomen: soft, non tender, non distended.  Extremities: Extremities well perfused, normal bulk and tone.  Neuro: Sedated on vent. Able to follow commands.  GU: Foley catheter in place.   Resolved Hospital Problem list     Assessment & Plan:  Decompensated systolic heart failure Cardiogenic shock Continue diuresis Continue dobutamine, monitor COOX Off Levophed. Management per heart failure team.   Atrial fibrillation -Telemetry  -amiodarone 30 mg/hr -Apixaban 5 mg twice daily (per primary team)   Acute respiratory failure with hypoxemia due to acute pulmonary edemaBilateral pleural effusions  Monitor off antibiotics Continue full mechanical vent support VAP prophylaxis Daily wake-up assessment SAT SAT as tolerated. Not able to extubate this morning due to mental status and significant amount of secretions.  Fever  Temp up to 101.2 with thick  secretions on 8/2 which has now resolved. CXR with improvement of interstitial infiltrates from prior. Work up thus far has been unrevealing. If continues to fever, will do further work up. -Continue linezolid and cefepime.  -Remove foley catheter. 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   -BMET and UOP -Urine output   Morbid obesity  OP follow up and counseling   Hypernatremia Sodium 151 this morning. -Receiving free water.  DM2 with hyperglycemia SSI + CBGs    Need for sedation for mechanical ventilation PAD guidelines Switch to precedex Wean fentanyl    Best Practice (right click and "Reselect all SmartList Selections" daily)   Diet/type: NPO DVT prophylaxis: DOAC GI prophylaxis: PPI Lines: Central line and yes and it is still needed Foley:  Yes, and it is no longer needed and removal ordered  Code Status:  full code Last date of multidisciplinary goals of care discussion [per primary]  Labs   CBC: Recent Labs  Lab 05/02/21 0245 05/03/21 0351 05/03/21 0408 05/04/21 0340 05/05/21 0508 05/05/21 0535 05/06/21 0324 05/06/21 0330  WBC 10.9*  --  9.6 9.4 10.2  --   --  11.0*  NEUTROABS  --   --   --  5.9 6.3  --   --   --   HGB 13.9   < > 13.8 13.5 13.1 16.0 12.9* 13.1  HCT 40.7   < > 40.2 42.1 41.2 47.0 38.0* 42.0  MCV 81.6  --  80.9 85.2 85.5  --   --  87.3  PLT 165  --  192 185 215  --   --  223   < > = values in this interval not displayed.    Basic Metabolic Panel: Recent Labs  Lab 04/30/21 0555 04/30/21 1754 05/01/21 0334 05/01/21 1356 05/02/21 0245 05/02/21 1652 05/03/21 0407 05/03/21 1600 05/04/21 0340 05/04/21 1549 05/05/21 0508 05/05/21 0535 05/05/21 1715 05/06/21 0324 05/06/21 0330  NA 134*   < > 139   < > 136  135   < > 143   < > 145 147* 149* 151* 151* 154* 151*  K 4.4   < > 4.3   < > 3.4*  3.3*   < > 4.1   < > 4.0 4.1 3.7 3.6 3.9 3.4* 3.6  CL 105  --  106   < > 103  100   < > 110   < > 109 109 109  --  112*  --  111  CO2 16*  --  24   < > 21*  21*   < > 23   < > '28 28 31  ' --  30  --  31  GLUCOSE 322*  --  144*   < > 292*  295*   < > 197*   < > 198* 208* 191*  --  198*  --  206*  BUN 44*  --  54*   < > 63*  60*   < > 53*   < > 52* 53* 57*  --  61*  --  65*  CREATININE 2.20*  --  4.65*   < > 4.57*  4.58*   < >  2.94*   < > 2.18* 2.04* 1.98*  --  2.02*  --  2.21*  CALCIUM 9.3  --  9.3   < > 9.3  9.4   < > 9.6   < > 9.8 10.0 10.3  --  10.4*  --  10.6*  MG 2.1  --  2.3  --   --   --   --   --   --   --   --   --  2.3  --  2.3  PHOS  --   --   --   --  4.7*  --  3.4  --  4.7*  --   --   --   --   --   --    < > = values in this interval not displayed.   GFR: Estimated Creatinine Clearance: 56.7 mL/min (A) (by C-G formula based on SCr of 2.21 mg/dL (H)). Recent Labs  Lab 04/30/21 0955 04/30/21 1741 05/01/21 0334 05/02/21 0245 05/03/21 0408 05/04/21 0340 05/05/21 0508 05/06/21 0330  PROCALCITON  --  0.47  --   --   --   --   --   --   WBC  --   --   --    < > 9.6 9.4 10.2 11.0*  LATICACIDVEN 2.7* 3.0* 2.4*  --   --   --   --   --    < > = values in this interval not displayed.    Liver Function Tests: Recent Labs  Lab 04/30/21 0555 05/02/21 0245 05/03/21 0407 05/04/21 0340 05/05/21 0508  AST 43* 19  --  17 19  ALT 135* 61*  --  38 30  ALKPHOS 55 42  --  44 41  BILITOT 0.7 1.2  --  1.0 1.1  PROT 6.8 5.9*  --  6.5 6.9  ALBUMIN 3.2* 2.4*  2.4* 2.3* 2.4* 2.3*   No results for input(s): LIPASE, AMYLASE in the last 168 hours. No results for input(s): AMMONIA in the last 168 hours.  ABG    Component Value Date/Time   PHART 7.454 (H) 05/06/2021 0324   PCO2ART 41.3 05/06/2021 0324   PO2ART 73 (L) 05/06/2021 0324   HCO3 28.8 (H) 05/06/2021 0324   TCO2 30 05/06/2021 0324   ACIDBASEDEF 1.0 05/01/2021 0107   O2SAT 66.7 05/06/2021 0330     Coagulation Profile: No results for input(s): INR, PROTIME in the last 168 hours.  Cardiac Enzymes: No results for input(s): CKTOTAL, CKMB, CKMBINDEX, TROPONINI in the last 168 hours.  HbA1C: HbA1c, POC (prediabetic range)  Date/Time Value Ref Range Status  06/24/2020 09:20 AM 7.5 (A) 5.7 - 6.4 % Final  03/19/2020 10:22 AM 8.1 (A) 5.7 - 6.4 % Final   HbA1c, POC (controlled diabetic range)  Date/Time Value Ref Range Status  06/24/2020  09:20 AM 7.5 (A) 0.0 - 7.0 % Final  03/19/2020 10:22 AM 8.1 (A) 0.0 - 7.0 % Final   HbA1c POC (<> result, manual entry)  Date/Time Value Ref Range Status  06/24/2020 09:20 AM 7.5 4.0 - 5.6 % Final  03/19/2020 10:22 AM 8.1 4.0 - 5.6 % Final   Hgb A1c MFr Bld  Date/Time Value Ref Range Status  04/30/2021 05:55 AM 9.4 (H) 4.8 - 5.6 % Final    Comment:    (NOTE)         Prediabetes: 5.7 - 6.4         Diabetes: >6.4         Glycemic control for adults with diabetes: <7.0   10/28/2020 09:32 AM 8.6 (H) 4.8 - 5.6 % Final    Comment:             Prediabetes: 5.7 - 6.4  Diabetes: >6.4          Glycemic control for adults with diabetes: <7.0     CBG: Recent Labs  Lab 05/05/21 1954 05/05/21 2122 05/05/21 2325 05/06/21 0421 05/06/21 0759  GLUCAP 180* 225* 214* 194* 212*    Review of Systems:   Unable to assess given intubated status.  Past Medical History:  He,  has a past medical history of Anxiety, Aspirin allergy, Childhood asthma, Chronic systolic CHF (congestive heart failure) ([place]), CKD (chronic kidney disease) stage 2, GFR 60-89 ml/min, H/O vasectomy (12/2019), High cholesterol, Hypertension, Morbid obesity ([place]), Nephrolithiasis, OSA on CPAP, Paroxysmal atrial fibrillation ([place]), Presumed NICM, Renal cell carcinoma ([place]), Troponin level elevated, Type II diabetes mellitus ([place]), and Ventricular tachycardia ([place]).   Surgical History:   Past Surgical History:  Procedure Laterality Date   APPENDECTOMY  07/2004   BIOPSY  02/10/2021   Procedure: BIOPSY;  Surgeon: Gatha Mayer, MD;  Location: [facility] ENDOSCOPY;  Service: Endoscopy;;   CARDIAC CATHETERIZATION N/A 05/17/2015   Procedure: Right/Left Heart Cath and Coronary Angiography;  Surgeon: Jolaine Artist, MD;  Location: [place] [facility];  Service: Cardiovascular;  Laterality: N/A;   CARDIOVERSION N/A 04/30/2021   Procedure: CARDIOVERSION;  Surgeon: Larey Dresser, MD;  [facility];  Service:  Cardiovascular;  Laterality: N/A;   COLONOSCOPY WITH PROPOFOL N/A 02/10/2021   Procedure: COLONOSCOPY WITH PROPOFOL;  Surgeon: Gatha Mayer, MD;  Location: [facility] ENDOSCOPY;  Service: Endoscopy;  Laterality: N/A;   EP IMPLANTABLE DEVICE N/A 05/17/2015   Procedure: SubQ ICD Implant;  Surgeon: Will Meredith Leeds, MD;  Location: [place] [facility];  Service: Cardiovascular;  Laterality: N/A;   ESOPHAGOGASTRODUODENOSCOPY (EGD) WITH PROPOFOL N/A 02/10/2021   Procedure: ESOPHAGOGASTRODUODENOSCOPY (EGD) WITH PROPOFOL;  Surgeon: Gatha Mayer, MD;  Location: [facility];  Service: Endoscopy;  Laterality: N/A;   ROBOTIC ASSISTED LAPAROSCOPIC LYSIS OF ADHESION  01/13/2013   Procedure: ROBOTIC ASSISTED LAPAROSCOPIC LYSIS OF ADHESION EXTENSIVE;  Surgeon: Alexis Frock, MD;  Location: [facility] ORS;  Service: Urology;;   ROBOTIC ASSITED PARTIAL NEPHRECTOMY Right 01/13/2013   Procedure: ROBOTIC ASSITED PARTIAL NEPHRECTOMY CONVERTED TO ROBOTIC ASSISTED RIGHT RADICAL NEPHRECTOMY;  Surgeon: Alexis Frock, MD;  Location: [facility] OR[facility];  Service: Urology;  Laterality: Right;   SUBQ ICD CHANGEOUT N/A 05/16/2020   Procedure: SUBQ ICD CHANGEOUT;  Surgeon: [name], MD;  Location: [place] [facility];  Service: Cardiovascular;  Laterality: N/A;   SUBQ ICD CHANGEOUT N/A 05/15/2020   Procedure: SUBQ ICD CHANGEOUT;  Surgeon: [name], MD;  Location: [place] [facility];  Service: Cardiovascular;  Laterality: N/A;   TEE WITHOUT CARDIOVERSION N/A 04/30/2021   Procedure: TRANSESOPHAGEAL ECHOCARDIOGRAM (TEE);  Surgeon: Larey Dresser, MD;  Location: [facility] ENDOSCOPY;  Service: Cardiovascular;  Laterality: N/A;   VASECTOMY       Social History:   reports that he has been smoking cigarettes. He has a 5.50 pack-year smoking history. He has never used smokeless tobacco. He reports current alcohol use. He reports current drug use. Frequency: 14.00 times per week. Drug: Marijuana.   Family History:  His family history includes  Diabetes in his maternal aunt; Hypertension in his mother. There is no history of Heart attack, Stroke, Colon cancer, Pancreatic cancer, Stomach cancer, Liver cancer, or Esophageal cancer.   Allergies Allergies  Allergen Reactions   Aspirin Shortness Of Breath, Itching and Rash     Burning sensation (Patient reports he tolerates other NSAIDS)    Bee Venom Hives and  Swelling   Lisinopril Cough   Tomato Rash     Home Medications  Prior to Admission medications   Medication Sig Start Date End Date Taking? Authorizing Provider  allopurinol (ZYLOPRIM) 100 MG tablet Take 1 tablet (100 mg total) by mouth daily. 01/09/21  Yes Clegg, Amy D, NP  amiodarone (PACERONE) 100 MG tablet TAKE 1 TABLET (100 MG TOTAL) BY MOUTH DAILY. Patient taking differently: Take 100 mg by mouth daily. 01/09/21 01/09/22 Yes Clegg, Amy D, NP  apixaban (ELIQUIS) 5 MG TABS tablet TAKE 1 TABLET (5 MG TOTAL) BY MOUTH 2 (TWO) TIMES DAILY. RESUME WITH EVENING DOSE ON SUNDAY Patient taking differently: Take 5 mg by mouth 2 (two) times daily. 01/09/21 01/09/22 Yes Clegg, Amy D, NP  busPIRone (BUSPAR) 10 MG tablet TAKE 1 TABLET (10 MG TOTAL) BY MOUTH 2 (TWO) TIMES DAILY. Patient taking differently: Take 10 mg by mouth 2 (two) times daily. 06/24/20 06/24/21 Yes [name], FNP  carvedilol (COREG) 6.25 MG tablet TAKE 1 TABLET (6.25 MG TOTAL) BY MOUTH 2 (TWO) TIMES DAILY. Patient taking differently: Take 6.25 mg by mouth 2 (two) times daily. 08/07/20 08/07/21 Yes Bensimhon, Shaune Pascal, MD  cetirizine (ZYRTEC) 10 MG tablet Take 1 tablet (10 mg total) by mouth daily. 06/27/20  Yes [name], FNP  empagliflozin (JARDIANCE) 10 MG TABS tablet TAKE 1 TABLET (10 MG TOTAL) BY MOUTH DAILY BEFORE BREAKFAST. Patient taking differently: Take 10 mg by mouth daily before breakfast. 12/06/20 12/06/21 Yes Milford, Maricela Bo, FNP  famotidine (PEPCID) 40 MG tablet TAKE 1 TABLET (40 MG TOTAL) BY MOUTH DAILY AS NEEDED FOR HEARTBURN OR INDIGESTION. Patient  taking differently: Take 40 mg by mouth daily as needed for indigestion or heartburn. 12/25/20 12/25/21 Yes Gatha Mayer, MD  ferrous sulfate 325 (65 FE) MG tablet Take 1 tablet (325 mg total) by mouth daily with breakfast. 03/04/21  Yes Gatha Mayer, MD  fluticasone [facility]) 50 MCG/ACT nasal spray Place 2 sprays into both nostrils daily. Patient taking differently: Place 2 sprays into both nostrils daily as needed for allergies. 09/18/19  Yes [name], FNP  gabapentin (NEURONTIN) 300 MG capsule Take 1 capsule (300 mg total) by mouth 3 (three) times daily. 06/27/20  Yes [name], FNP  glipiZIDE (GLUCOTROL) 10 MG tablet Take 1 tablet (10 mg total) by mouth 2 (two) times daily before a meal. 04/22/21  Yes King, Diona Foley, NP  insulin aspart (NOVOLOG) 100 UNIT/ML FlexPen INJECT 0-15 UNIT(S) INTO THE SKIN THREE TIMES DAILY WITH MEALS VIA SLIDING SCALE Patient taking differently: Inject 0-15 Units into the skin 3 (three) times daily with meals. 11/15/20 11/15/21 Yes [name], FNP  insulin NPH-regular Human (70-30) 100 UNIT/ML injection INJECT 38 UNITS INTO THE SKIN 2 (TWO) TIMES DAILY WITH A MEAL. Patient taking differently: Inject 38 Units into the skin 2 (two) times daily with a meal. 06/27/20 06/27/21 Yes [name], FNP  metFORMIN (GLUCOPHAGE) 1000 MG tablet Take 1 tablet (1,000 mg total) by mouth 2 (two) times daily with a meal. 01/09/21  Yes Clegg, Amy D, NP  oxyCODONE (ROXICODONE) 5 MG immediate release tablet Take 1 tablet (5 mg total) by mouth every 6 (six) hours as needed for up to 10 doses for breakthrough pain. 04/12/21  Yes Curatolo, Adam, DO  sacubitril-valsartan (ENTRESTO) 49-51 MG TAKE 1 TABLET BY MOUTH 2 (TWO) TIMES DAILY. 01/24/21  Yes Clegg, Amy D, NP  spironolactone (ALDACTONE) 25 MG tablet Take 1 tablet (25  mg total) by mouth daily. 01/09/21 08/07/21 Yes Clegg, Amy D, NP  torsemide (DEMADEX) 20 MG tablet TAKE 1 TABLET (20 MG TOTAL) BY MOUTH DAILY. Patient taking  differently: Take 20 mg by mouth daily. 01/09/21 01/09/22 Yes Clegg, Amy D, NP  traZODone (DESYREL) 100 MG tablet Take 1 tablet (100 mg total) by mouth at bedtime. For sleep 03/05/21  Yes Vevelyn Francois, NP  Vitamin D, Ergocalciferol, (DRISDOL) 1.25 MG (50000 UNIT) CAPS capsule TAKE 1 CAPSULE (50,000 UNITS TOTAL) BY MOUTH ONCE A WEEK. Patient taking differently: Take 50,000 Units by mouth every Monday. 10/29/20 10/29/21 Yes [name], FNP  Accu-Chek FastClix Lancets MISC USE AS DIRECTED FOUR TIMES DAILY 01/29/21 01/29/22  Vevelyn Francois, NP  blood glucose meter kit and supplies Use up to four times daily as directed. 01/27/21   Vevelyn Francois, NP  Continuous Blood Gluc Receiver (FREESTYLE LIBRE 14 DAY READER) DEVI 1 each by Does not apply route 3 (three) times daily between meals as needed. 03/19/20   [name], FNP  Continuous Blood Gluc Sensor (GUARDIAN SENSOR 3) MISC 1 Device by Does not apply route 3 (three) times daily between meals as needed. 03/25/20   [name], FNP  Insulin Pen Needle 31G X 5 MM MISC USE AS DIRECTED 11/27/20 11/27/21  [name], FNP  Insulin Syringe-Needle U-100 31G X 5/16" 0.5 ML MISC USE TO INJECT INSULIN TWICE DAILY. 09/17/20 09/17/21  [name], FNP  Magnesium Oxide 200 MG TABS Take 100 mg by mouth daily.    [provider]  predniSONE (DELTASONE) 10 MG tablet take 2 tablets by mouth for 2 days, then 1 tab for 2 days, then stop Patient not taking: Reported on 04/29/2021 04/16/21   Fenton Foy, NP  Colchicine (MITIGARE) 0.6 MG CAPS Take 1 tablet by mouth 2 (two) times daily as needed (gout). 06/27/20 11/26/20  [name], FNP       PCCM:  45 yo PMH decompensated HF, cardiogenic shock, afib  New fevers  BP 107/81   Pulse 73   Temp (!) 101 F (38.3 C)   Resp (!) 26   Ht '5\' 8"'  (1.727 m)   Wt (!) 137.4 kg   SpO2 93%   BMI 46.06 kg/m   Gen: obese elderly male, intubated on life support  HENT: NCAT tracking  appropriately  Heart: RRR s1 s2  Lungs: Bilateral ventilated breath sounds, no wheeze  Abd: soft nt nd  Labs reviewed   Cxr: pulmonary edema, vascular congestion, layering effusions   A:  Decompensated Systolic Heart Failure  Cardiogenic Shock  New fevers, possible ongoing infection Possible sepsis  Atrial Fibrillation  Acute hypoxemic respiratory failure  Bilateral pulmonary edema  Acute pulmonary edema  Bilateral pleural effusions  AKI  Morbid obesity  DMII  P:  Remains on pressors  Weaning as tolerated  Following COOX Discussed case with cardiology  Remove foley  Consider removing CVL or replacing  Continue cefepime + linezolid  This patient is critically ill with multiple organ system failure; which, requires frequent high complexity decision making, assessment, support, evaluation, and titration of therapies. This was completed through the application of advanced monitoring technologies and extensive interpretation of multiple databases. During this encounter critical care time was devoted to patient care services described in this note for 35 minutes.   Garner Nash, DO [facility] 05/06/2021 6:56 PM

## 2021-05-06 NOTE — Progress Notes (Signed)
Pharmacy Antibiotic Note  Jeffrey [name] is a 45 y.o. male admitted on 04/29/2021 and now with fevers and thick secretions concerning for PNA. Pharmacy has been consulted for Vancomycin + Cefepime dosing.  Cr up slightly this morning (CrCl ~57 ml/min).  Plan: -Continue linezolid '600mg'$  q12h -Adjust cefepime to 2g q12h -Watch Cr closely, F/U Cx   Height: '5\' 8"'$  (172.7 cm) Weight: (!) 137.4 kg (302 lb 14.6 oz) IBW/kg (Calculated) : 68.4  Temp (24hrs), Avg:100.2 F (37.9 C), Min:98.5 F (36.9 C), Max:101.2 F (38.4 C)  Recent Labs  Lab 04/30/21 0955 04/30/21 1741 05/01/21 0334 05/01/21 1356 05/02/21 0245 05/02/21 1652 05/03/21 0408 05/03/21 1600 05/04/21 0340 05/04/21 1549 05/05/21 0508 05/05/21 1715 05/06/21 0330  WBC  --   --   --   --  10.9*  --  9.6  --  9.4  --  10.2  --  11.0*  CREATININE  --   --  4.65*   < > 4.57*  4.58*   < >  --    < > 2.18* 2.04* 1.98* 2.02* 2.21*  LATICACIDVEN 2.7* 3.0* 2.4*  --   --   --   --   --   --   --   --   --   --    < > = values in this interval not displayed.     Estimated Creatinine Clearance: 56.7 mL/min (A) (by C-G formula based on SCr of 2.21 mg/dL (H)).    Allergies  Allergen Reactions   Aspirin Shortness Of Breath, Itching and Rash     Burning sensation (Patient reports he tolerates other NSAIDS)    Bee Venom Hives and Swelling   Lisinopril Cough   Tomato Rash    Antimicrobials this admission: Cefepime 7/28 >> 7/28; restart 8/1 >> Zyvox 7/28 >> 7/28; restart 8/1 >> Rocephin 7/29 x 1   Microbiology results: 7/26 Flu/COVID >> neg 7/27 MRSA PCR >> neg 7/31 RCx (TA) >> rare staph aureus (pending sens)  Thank you for allowing pharmacy to be a part of this patient's care.   Arrie Senate, PharmD, BCPS, [facility] Clinical Pharmacist [phone] Please check AMION for all [place] numbers 05/06/2021

## 2021-05-06 NOTE — Progress Notes (Signed)
Inpatient Diabetes Program Recommendations  AACE/ADA: New Consensus Statement on Inpatient Glycemic Control (2015)  Target Ranges:  Prepandial:   less than 140 mg/dL      Peak postprandial:   less than 180 mg/dL (1-2 hours)      Critically ill patients:  140 - 180 mg/dL   Lab Results  Component Value Date   GLUCAP 315 (H) 05/06/2021   HGBA1C 9.4 (H) 04/30/2021     Consider increasing Levemir to 55 units BID and increasing tube feed coverage to Novolog 10 units Q4H (to be stopped or held in the event tube feeds are stopped).   Secure chat sent to Juanda Crumble, RN to explain tube feeds from 8/1 as it appears to be stopped in [facility] for "other"; assuming for weaning trial, however, tube feed coverage was given. In the event tube feed is stopped, please ensure tube feed coverage is also held to prevent hypoglycemic events.   Thanks, Bronson Curb, MSN, RNC-OB Diabetes Coordinator [phone] (8a-5p)

## 2021-05-07 ENCOUNTER — Inpatient Hospital Stay (HOSPITAL_COMMUNITY): Payer: Medicaid Other

## 2021-05-07 DIAGNOSIS — Z4682 Encounter for fitting and adjustment of non-vascular catheter: Secondary | ICD-10-CM | POA: Diagnosis not present

## 2021-05-07 DIAGNOSIS — N1831 Chronic kidney disease, stage 3a: Secondary | ICD-10-CM | POA: Diagnosis not present

## 2021-05-07 DIAGNOSIS — K5939 Other megacolon: Secondary | ICD-10-CM | POA: Diagnosis not present

## 2021-05-07 DIAGNOSIS — I5023 Acute on chronic systolic (congestive) heart failure: Secondary | ICD-10-CM | POA: Diagnosis not present

## 2021-05-07 DIAGNOSIS — J9811 Atelectasis: Secondary | ICD-10-CM | POA: Diagnosis not present

## 2021-05-07 DIAGNOSIS — K6389 Other specified diseases of intestine: Secondary | ICD-10-CM | POA: Diagnosis not present

## 2021-05-07 DIAGNOSIS — N170 Acute kidney failure with tubular necrosis: Secondary | ICD-10-CM | POA: Diagnosis not present

## 2021-05-07 DIAGNOSIS — J9601 Acute respiratory failure with hypoxia: Secondary | ICD-10-CM | POA: Diagnosis not present

## 2021-05-07 DIAGNOSIS — R570 Cardiogenic shock: Secondary | ICD-10-CM | POA: Diagnosis not present

## 2021-05-07 DIAGNOSIS — J9690 Respiratory failure, unspecified, unspecified whether with hypoxia or hypercapnia: Secondary | ICD-10-CM | POA: Diagnosis not present

## 2021-05-07 DIAGNOSIS — I480 Paroxysmal atrial fibrillation: Secondary | ICD-10-CM | POA: Diagnosis not present

## 2021-05-07 LAB — BASIC METABOLIC PANEL
Anion gap: 8 (ref 5–15)
BUN: 66 mg/dL — ABNORMAL HIGH (ref 6–20)
CO2: 29 mmol/L (ref 22–32)
Calcium: 10.4 mg/dL — ABNORMAL HIGH (ref 8.9–10.3)
Chloride: 114 mmol/L — ABNORMAL HIGH (ref 98–111)
Creatinine, Ser: 2.05 mg/dL — ABNORMAL HIGH (ref 0.61–1.24)
GFR, Estimated: 40 mL/min — ABNORMAL LOW (ref >60)
Glucose, Bld: 281 mg/dL — ABNORMAL HIGH (ref 70–99)
Potassium: 3.7 mmol/L (ref 3.5–5.1)
Sodium: 151 mmol/L — ABNORMAL HIGH (ref 135–145)

## 2021-05-07 LAB — GLUCOSE, CAPILLARY
Glucose-Capillary: 252 mg/dL — ABNORMAL HIGH (ref 70–99)
Glucose-Capillary: 235 mg/dL — ABNORMAL HIGH (ref 70–99)
Glucose-Capillary: 243 mg/dL — ABNORMAL HIGH (ref 70–99)
Glucose-Capillary: 207 mg/dL — ABNORMAL HIGH (ref 70–99)
Glucose-Capillary: 145 mg/dL — ABNORMAL HIGH (ref 70–99)
Glucose-Capillary: 185 mg/dL — ABNORMAL HIGH (ref 70–99)

## 2021-05-07 LAB — COOXEMETRY PANEL
Carboxyhemoglobin: 0.8 % (ref 0.5–1.5)
Methemoglobin: 1.1 % (ref 0.0–1.5)
O2 Saturation: 70.8 %
Total hemoglobin: 13.8 g/dL (ref 12.0–16.0)

## 2021-05-07 LAB — MAGNESIUM: Magnesium: 2.4 mg/dL (ref 1.7–2.4)

## 2021-05-07 LAB — SEDIMENTATION RATE: Sed Rate: 128 mm/hr — ABNORMAL HIGH (ref 0–16)

## 2021-05-07 LAB — PROCALCITONIN: Procalcitonin: 0.54 ng/mL

## 2021-05-07 MED ORDER — SORBITOL 70 % SOLN
30.0000 mL | Freq: Every day | Status: DC | PRN
  Administered 2021-05-07: 30 mL
  Filled 2021-05-07: qty 30

## 2021-05-07 MED ORDER — FUROSEMIDE 10 MG/ML IJ SOLN
40.0000 mg | Freq: Once | INTRAMUSCULAR | Status: AC
  Administered 2021-05-07: 40 mg via INTRAVENOUS
  Filled 2021-05-07: qty 4

## 2021-05-07 MED ORDER — PREDNISONE 20 MG PO TABS
60.0000 mg | ORAL_TABLET | Freq: Every day | ORAL | Status: DC
  Administered 2021-05-07: 60 mg
  Filled 2021-05-07 (×2): qty 3

## 2021-05-07 MED ORDER — CEFAZOLIN SODIUM-DEXTROSE 2-4 GM/100ML-% IV SOLN
2.0000 g | Freq: Three times a day (TID) | INTRAVENOUS | Status: AC
  Administered 2021-05-07 – 2021-05-11 (×13): 2 g via INTRAVENOUS
  Filled 2021-05-07 (×14): qty 100

## 2021-05-07 NOTE — Progress Notes (Signed)
NAME:  Jeffrey [name], MRN:  [id number], DOB:  Mar 18, 1976, LOS: 7 ADMISSION DATE:  04/29/2021, CONSULTATION DATE:  04/30/2021 REFERRING MD:  Marigene Ehlers CHIEF COMPLAINT: Dyspnea  History of Present Illness:  45 year old man with extensive cardiac hx as below presenting with decompensated heart failure in setting of afib.  Cardioverted earlier today but unforuntately worse respiratory distress.  + orthopnea.  Saturating 80s on NRB and RR 40s, Aox3, anxious.  Pertinent  Medical History  NICM with AICD in place, Hx Afib, Hx ventricular tachycardia, HTN, T2DM, R renal cell carcinoma s/p nephrectomy in 2014, CKD 3a, OSA on CPAP, Hx of traumatic intubation.  Significant Hospital Events: Including procedures, antibiotic start and stop dates in addition to other pertinent events   7/26 admitted. 7/27 cardioversion, worsening resp distress; intubation. 7/27 Echo with EF less than 20% (global HK) and grade 3 DD.  RV systolic function mod reduced. 8/32 foley complication, new catheter placed by urology with good output. 7/29 Continued urine output and creatinine plateau; urology consult: leave foley in for 7 days 7/30 Failed SBT 16/8 PSV 7/31 weaning on 16/8 PSV, diuresing, lots of secretions, more awake, Coox 76% 8/1 attempted SAT SBT this morning.  Still not awake enough. Started precedex. Fever up to 101.2 with thick secretions. Respiratory culture with rare Staph Aureus. Started on linezolid and cefepime. Eliquis restarted. 8/2 Desaturated to 89% on FIO2 of 85%. Increased FIO2 100% and PEEP 8. Fever up to 101.2 8/3 Respiratory culture with rare pan-sensitive MSSA. Started on cefazolin. Discontinued linezolid and cefepime.   Interim History / Subjective:  Attempting to wean, increased O2 requirements FiO2 70% , SpO2 94%. Dobutamine stopped, maintaining MAP > 65 mmHg.  Objective   Blood pressure (!) 114/94, pulse 68, temperature 99 F (37.2 C), temperature source Axillary, resp. rate (!) 24, height 5'  8" (1.727 m), weight (!) 136.9 kg, SpO2 95 %. CVP:  [5 mmHg-8 mmHg] 6 mmHg  Vent Mode: PRVC FiO2 (%):  [70 %-100 %] 70 % Set Rate:  [18 bmp] 18 bmp Vt Set:  [550 mL] 550 mL PEEP:  [8 cmH20] 8 cmH20 Plateau Pressure:  [21 cmH20-26 cmH20] 22 cmH20   Intake/Output Summary (Last 24 hours) at 05/07/2021 1034 Last data filed at 05/07/2021 0600 Gross per 24 hour  Intake 2212.45 ml  Output 1275 ml  Net 937.45 ml    Filed Weights   05/05/21 0100 05/06/21 0431 05/07/21 0400  Weight: (!) 137.8 kg (!) 137.4 kg (!) 136.9 kg    Examination: General: Chronically ill appearing, intubated lying in bed.  HENT: NCAT, ETT in place.  Lungs: Scattered rhonchi bilaterally. No crackles or wheezes.  Cardiovascular: Normal rate and rhythm. No murmur or gallops.  Abdomen: soft, non tender, non distended.  Extremities: Extremities well perfused, normal bulk and tone.  Neuro: Sedated on vent. Able to follow commands.   Resolved Hospital Problem list     Assessment & Plan:  Decompensated systolic heart failure Cardiogenic shock Holding diuresis in the setting of hypernatremia per primary team Dobutamine discontinued, monitor COOX Management per heart failure team.   Acute respiratory failure with hypoxemia due to acute pulmonary edemaBilateral pleural effusions Continue full mechanical vent support VAP prophylaxis Daily wake-up assessment SBT/SAT as tolerated. Not able to extubate this morning due to mental status.  Weaning O2 requirement.  Suspected TracheobronchitisFever  Intermittent fever up to 101.2. 8/2 CXR with improvement of interstitial infiltrates from prior which makes pneumonia less likely. Respiratory cultures grew rare pan sensitive  MSSA. Will start cefazolin. Will order CT Chest w/o contrast to r/o pneumonia. -Start cefazolin 2g q8h IV -D/c linezolid and cefepime.  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  -BMET and UOP -Urine output   Hypernatremia Sodium 151 this morning. -Receiving free water.  DM2 with hyperglycemia SSI + CBGs   Morbid obesity  OP follow up and counseling   Need for sedation for mechanical ventilation PAD guidelines Switch to precedex Wean fentanyl    Best Practice (right click and "Reselect all SmartList Selections" daily)   Diet/type: NPO DVT prophylaxis: DOAC GI prophylaxis: PPI Lines: Central line and yes and it is still needed Foley:  Yes, and it is no longer needed and removal ordered  Code Status:  full code Last date of multidisciplinary goals of care discussion [per primary]  Labs   CBC: Recent Labs  Lab 05/02/21 0245 05/03/21 0351 05/03/21 0408 05/04/21 0340 05/05/21 0508 05/05/21 0535 05/06/21 0324 05/06/21 0330  WBC 10.9*  --  9.6 9.4 10.2  --   --  11.0*  NEUTROABS  --   --   --  5.9 6.3  --   --   --   HGB 13.9   < > 13.8 13.5 13.1 16.0 12.9* 13.1  HCT 40.7   < > 40.2 42.1 41.2 47.0 38.0* 42.0  MCV 81.6  --  80.9 85.2 85.5  --   --  87.3  PLT 165  --  192 185 215  --   --  223   < > = values in this interval not displayed.     Basic Metabolic Panel: Recent Labs  Lab 05/01/21 0334 05/01/21 1356 05/02/21 0245 05/02/21 1652 05/03/21 0407 05/03/21 1600 05/04/21 0340 05/04/21 1549 05/05/21 0508 05/05/21 0535 05/05/21 1715 05/06/21 0324 05/06/21 0330 05/07/21 0346  NA 139   < > 136  135   < > 143   < > 145 147* 149* 151* 151* 154* 151* 151*  K 4.3   < > 3.4*  3.3*   < > 4.1   < > 4.0 4.1 3.7 3.6 3.9 3.4* 3.6 3.7  CL 106   < > 103  100   < > 110   < > 109 109 109  --  112*  --  111 114*  CO2 24   < > 21*  21*   < > 23   < > '28 28 31  ' --  30  --  31 29  GLUCOSE 144*   < > 292*  295*   < > 197*   < > 198* 208* 191*  --  198*  --  206* 281*  BUN 54*   < > 63*  60*   < > 53*   < > 52* 53* 57*  --  61*  --  65* 66*  CREATININE 4.65*   < > 4.57*   4.58*   < > 2.94*   < > 2.18* 2.04* 1.98*  --  2.02*  --  2.21* 2.05*  CALCIUM 9.3   < > 9.3  9.4   < > 9.6   < > 9.8 10.0 10.3  --  10.4*  --  10.6* 10.4*  MG 2.3  --   --   --   --   --   --   --   --   --  2.3  --  2.3 2.4  PHOS  --   --  4.7*  --  3.4  --  4.7*  --   --   --   --   --   --   --    < > = values in this interval not displayed.    GFR: Estimated Creatinine Clearance: 61 mL/min (A) (by C-G formula based on SCr of 2.05 mg/dL (H)). Recent Labs  Lab 04/30/21 1741 05/01/21 0334 05/02/21 0245 05/03/21 0408 05/04/21 0340 05/05/21 0508 05/06/21 0330  PROCALCITON 0.47  --   --   --   --   --   --   WBC  --   --    < > 9.6 9.4 10.2 11.0*  LATICACIDVEN 3.0* 2.4*  --   --   --   --   --    < > = values in this interval not displayed.     Liver Function Tests: Recent Labs  Lab 05/02/21 0245 05/03/21 0407 05/04/21 0340 05/05/21 0508  AST 19  --  17 19  ALT 61*  --  38 30  ALKPHOS 42  --  44 41  BILITOT 1.2  --  1.0 1.1  PROT 5.9*  --  6.5 6.9  ALBUMIN 2.4*  2.4* 2.3* 2.4* 2.3*    No results for input(s): LIPASE, AMYLASE in the last 168 hours. No results for input(s): AMMONIA in the last 168 hours.  ABG    Component Value Date/Time   PHART 7.454 (H) 05/06/2021 0324   PCO2ART 41.3 05/06/2021 0324   PO2ART 73 (L) 05/06/2021 0324   HCO3 28.8 (H) 05/06/2021 0324   TCO2 30 05/06/2021 0324   ACIDBASEDEF 1.0 05/01/2021 0107   O2SAT 70.8 05/07/2021 0346      Coagulation Profile: No results for input(s): INR, PROTIME in the last 168 hours.  Cardiac Enzymes: No results for input(s): CKTOTAL, CKMB, CKMBINDEX, TROPONINI in the last 168 hours.  HbA1C: HbA1c, POC (prediabetic range)  Date/Time Value Ref Range Status  06/24/2020 09:20 AM 7.5 (A) 5.7 - 6.4 % Final  03/19/2020 10:22 AM 8.1 (A) 5.7 - 6.4 % Final   HbA1c, POC (controlled diabetic range)  Date/Time Value Ref Range Status  06/24/2020 09:20 AM 7.5 (A) 0.0 - 7.0 % Final  03/19/2020 10:22 AM 8.1  (A) 0.0 - 7.0 % Final   HbA1c POC (<> result, manual entry)  Date/Time Value Ref Range Status  06/24/2020 09:20 AM 7.5 4.0 - 5.6 % Final  03/19/2020 10:22 AM 8.1 4.0 - 5.6 % Final   Hgb A1c MFr Bld  Date/Time Value Ref Range Status  04/30/2021 05:55 AM 9.4 (H) 4.8 - 5.6 % Final    Comment:    (NOTE)         Prediabetes: 5.7 - 6.4         Diabetes: >6.4         Glycemic control for adults with diabetes: <7.0   10/28/2020 09:32 AM 8.6 (H) 4.8 - 5.6 % Final    Comment:             Prediabetes: 5.7 - 6.4          Diabetes: >6.4          Glycemic control for adults with diabetes: <7.0     CBG: Recent Labs  Lab 05/06/21 2018 05/06/21 2328 05/07/21 0403 05/07/21 0728 05/07/21 0750  GLUCAP 147* 161* 252* 235* 243*     Review  of Systems:   Unable to assess given intubated status.  Past Medical History:  He,  has a past medical history of Anxiety, Aspirin allergy, Childhood asthma, Chronic systolic CHF (congestive heart failure) ([place]), CKD (chronic kidney disease) stage 2, GFR 60-89 ml/min, H/O vasectomy (12/2019), High cholesterol, Hypertension, Morbid obesity ([place]), Nephrolithiasis, OSA on CPAP, Paroxysmal atrial fibrillation ([place]), Presumed NICM, Renal cell carcinoma ([place]), Troponin level elevated, Type II diabetes mellitus ([place]), and Ventricular tachycardia ([place]).   Surgical History:   Past Surgical History:  Procedure Laterality Date   APPENDECTOMY  07/2004   BIOPSY  02/10/2021   Procedure: BIOPSY;  Surgeon: Gatha Mayer, MD;  Location: [facility];  Service: Endoscopy;;   CARDIAC CATHETERIZATION N/A 05/17/2015   Procedure: Right/Left Heart Cath and Coronary Angiography;  Surgeon: Jolaine Artist, MD;  Location: [place] [facility];  Service: Cardiovascular;  Laterality: N/A;   CARDIOVERSION N/A 04/30/2021   Procedure: CARDIOVERSION;  Surgeon: Larey Dresser, MD;  Location: [facility] ENDOSCOPY;  Service: Cardiovascular;  Laterality: N/A;   COLONOSCOPY WITH PROPOFOL N/A  02/10/2021   Procedure: COLONOSCOPY WITH PROPOFOL;  Surgeon: Gatha Mayer, MD;  Location: [facility];  Service: Endoscopy;  Laterality: N/A;   EP IMPLANTABLE DEVICE N/A 05/17/2015   Procedure: SubQ ICD Implant;  Surgeon: Will Meredith Leeds, MD;  Location: [place] [facility];  Service: Cardiovascular;  Laterality: N/A;   ESOPHAGOGASTRODUODENOSCOPY (EGD) WITH PROPOFOL N/A 02/10/2021   Procedure: ESOPHAGOGASTRODUODENOSCOPY (EGD) WITH PROPOFOL;  Surgeon: Gatha Mayer, MD;  Location: [facility];  Service: Endoscopy;  Laterality: N/A;   ROBOTIC ASSISTED LAPAROSCOPIC LYSIS OF ADHESION  01/13/2013   Procedure: ROBOTIC ASSISTED LAPAROSCOPIC LYSIS OF ADHESION EXTENSIVE;  Surgeon: Alexis Frock, MD;  Location: [facility];  Service: Urology;;   ROBOTIC ASSITED PARTIAL NEPHRECTOMY Right 01/13/2013   Procedure: ROBOTIC ASSITED PARTIAL NEPHRECTOMY CONVERTED TO ROBOTIC ASSISTED RIGHT RADICAL NEPHRECTOMY;  Surgeon: Alexis Frock, MD;  Location: [facility];  Service: Urology;  Laterality: Right;   SUBQ ICD CHANGEOUT N/A 05/16/2020   Procedure: SUBQ ICD CHANGEOUT;  Surgeon: [name], MD;  Location: [place] [facility];  Service: Cardiovascular;  Laterality: N/A;   SUBQ ICD CHANGEOUT N/A 05/15/2020   Procedure: SUBQ ICD CHANGEOUT;  Surgeon: [name], MD;  Location: [place] [facility];  Service: Cardiovascular;  Laterality: N/A;   TEE WITHOUT CARDIOVERSION N/A 04/30/2021   Procedure: TRANSESOPHAGEAL ECHOCARDIOGRAM (TEE);  Surgeon: Larey Dresser, MD;  Location: [facility] ENDOSCOPY;  Service: Cardiovascular;  Laterality: N/A;   VASECTOMY       Social History:   reports that he has been smoking cigarettes. He has a 5.50 pack-year smoking history. He has never used smokeless tobacco. He reports current alcohol use. He reports current drug use. Frequency: 14.00 times per week. Drug: Marijuana.   Family History:  His family history includes Diabetes in his maternal aunt; Hypertension in his mother. There is no  history of Heart attack, Stroke, Colon cancer, Pancreatic cancer, Stomach cancer, Liver cancer, or Esophageal cancer.   Allergies Allergies  Allergen Reactions   Aspirin Shortness Of Breath, Itching and Rash     Burning sensation (Patient reports he tolerates other NSAIDS)    Bee Venom Hives and Swelling   Lisinopril Cough   Tomato Rash     Home Medications  Prior to Admission medications   Medication Sig Start Date End Date Taking? Authorizing Provider  allopurinol (ZYLOPRIM) 100 MG tablet Take 1 tablet (100 mg total) by mouth daily. 01/09/21  Yes Clegg, Amy D, NP  amiodarone (PACERONE) 100 MG tablet TAKE 1 TABLET (100 MG TOTAL) BY MOUTH DAILY. Patient taking differently: Take 100 mg by mouth daily. 01/09/21 01/09/22 Yes Clegg, Amy D, NP  apixaban (ELIQUIS) 5 MG TABS tablet TAKE 1 TABLET (5 MG TOTAL) BY MOUTH 2 (TWO) TIMES DAILY. RESUME WITH EVENING DOSE ON SUNDAY Patient taking differently: Take 5 mg by mouth 2 (two) times daily. 01/09/21 01/09/22 Yes Clegg, Amy D, NP  busPIRone (BUSPAR) 10 MG tablet TAKE 1 TABLET (10 MG TOTAL) BY MOUTH 2 (TWO) TIMES DAILY. Patient taking differently: Take 10 mg by mouth 2 (two) times daily. 06/24/20 06/24/21 Yes [name], FNP  carvedilol (COREG) 6.25 MG tablet TAKE 1 TABLET (6.25 MG TOTAL) BY MOUTH 2 (TWO) TIMES DAILY. Patient taking differently: Take 6.25 mg by mouth 2 (two) times daily. 08/07/20 08/07/21 Yes Bensimhon, Shaune Pascal, MD  cetirizine (ZYRTEC) 10 MG tablet Take 1 tablet (10 mg total) by mouth daily. 06/27/20  Yes [name], FNP  empagliflozin (JARDIANCE) 10 MG TABS tablet TAKE 1 TABLET (10 MG TOTAL) BY MOUTH DAILY BEFORE BREAKFAST. Patient taking differently: Take 10 mg by mouth daily before breakfast. 12/06/20 12/06/21 Yes Milford, Maricela Bo, FNP  famotidine (PEPCID) 40 MG tablet TAKE 1 TABLET (40 MG TOTAL) BY MOUTH DAILY AS NEEDED FOR HEARTBURN OR INDIGESTION. Patient taking differently: Take 40 mg by mouth daily as needed for indigestion or  heartburn. 12/25/20 12/25/21 Yes Gatha Mayer, MD  ferrous sulfate 325 (65 FE) MG tablet Take 1 tablet (325 mg total) by mouth daily with breakfast. 03/04/21  Yes Gatha Mayer, MD  fluticasone [facility]) 50 MCG/ACT nasal spray Place 2 sprays into both nostrils daily. Patient taking differently: Place 2 sprays into both nostrils daily as needed for allergies. 09/18/19  Yes [name], FNP  gabapentin (NEURONTIN) 300 MG capsule Take 1 capsule (300 mg total) by mouth 3 (three) times daily. 06/27/20  Yes [name], FNP  glipiZIDE (GLUCOTROL) 10 MG tablet Take 1 tablet (10 mg total) by mouth 2 (two) times daily before a meal. 04/22/21  Yes King, Diona Foley, NP  insulin aspart (NOVOLOG) 100 UNIT/ML FlexPen INJECT 0-15 UNIT(S) INTO THE SKIN THREE TIMES DAILY WITH MEALS VIA SLIDING SCALE Patient taking differently: Inject 0-15 Units into the skin 3 (three) times daily with meals. 11/15/20 11/15/21 Yes [name], FNP  insulin NPH-regular Human (70-30) 100 UNIT/ML injection INJECT 38 UNITS INTO THE SKIN 2 (TWO) TIMES DAILY WITH A MEAL. Patient taking differently: Inject 38 Units into the skin 2 (two) times daily with a meal. 06/27/20 06/27/21 Yes [name], FNP  metFORMIN (GLUCOPHAGE) 1000 MG tablet Take 1 tablet (1,000 mg total) by mouth 2 (two) times daily with a meal. 01/09/21  Yes Clegg, Amy D, NP  oxyCODONE (ROXICODONE) 5 MG immediate release tablet Take 1 tablet (5 mg total) by mouth every 6 (six) hours as needed for up to 10 doses for breakthrough pain. 04/12/21  Yes Curatolo, Adam, DO  sacubitril-valsartan (ENTRESTO) 49-51 MG TAKE 1 TABLET BY MOUTH 2 (TWO) TIMES DAILY. 01/24/21  Yes Clegg, Amy D, NP  spironolactone (ALDACTONE) 25 MG tablet Take 1 tablet (25 mg total) by mouth daily. 01/09/21 08/07/21 Yes Clegg, Amy D, NP  torsemide (DEMADEX) 20 MG tablet TAKE 1 TABLET (20 MG TOTAL) BY MOUTH DAILY. Patient taking differently: Take 20 mg by mouth daily. 01/09/21 01/09/22 Yes Clegg, Amy D, NP   traZODone (DESYREL) 100  MG tablet Take 1 tablet (100 mg total) by mouth at bedtime. For sleep 03/05/21  Yes Vevelyn Francois, NP  Vitamin D, Ergocalciferol, (DRISDOL) 1.25 MG (50000 UNIT) CAPS capsule TAKE 1 CAPSULE (50,000 UNITS TOTAL) BY MOUTH ONCE A WEEK. Patient taking differently: Take 50,000 Units by mouth every Monday. 10/29/20 10/29/21 Yes [name], FNP  Accu-Chek FastClix Lancets MISC USE AS DIRECTED FOUR TIMES DAILY 01/29/21 01/29/22  Vevelyn Francois, NP  blood glucose meter kit and supplies Use up to four times daily as directed. 01/27/21   Vevelyn Francois, NP  Continuous Blood Gluc Receiver (FREESTYLE LIBRE 14 DAY READER) DEVI 1 each by Does not apply route 3 (three) times daily between meals as needed. 03/19/20   [name], FNP  Continuous Blood Gluc Sensor (GUARDIAN SENSOR 3) MISC 1 Device by Does not apply route 3 (three) times daily between meals as needed. 03/25/20   [name], FNP  Insulin Pen Needle 31G X 5 MM MISC USE AS DIRECTED 11/27/20 11/27/21  [name], FNP  Insulin Syringe-Needle U-100 31G X 5/16" 0.5 ML MISC USE TO INJECT INSULIN TWICE DAILY. 09/17/20 09/17/21  [name], FNP  Magnesium Oxide 200 MG TABS Take 100 mg by mouth daily.    [provider]  predniSONE (DELTASONE) 10 MG tablet take 2 tablets by mouth for 2 days, then 1 tab for 2 days, then stop Patient not taking: Reported on 04/29/2021 04/16/21   Fenton Foy, NP  Colchicine (MITIGARE) 0.6 MG CAPS Take 1 tablet by mouth 2 (two) times daily as needed (gout). 06/27/20 11/26/20  [name], FNP

## 2021-05-07 NOTE — Progress Notes (Signed)
Inpatient Diabetes Program Recommendations  AACE/ADA: New Consensus Statement on Inpatient Glycemic Control (2015)  Target Ranges:  Prepandial:   less than 140 mg/dL      Peak postprandial:   less than 180 mg/dL (1-2 hours)      Critically ill patients:  140 - 180 mg/dL   Lab Results  Component Value Date   GLUCAP 243 (H) 05/07/2021   HGBA1C 9.4 (H) 04/30/2021    Review of Glycemic Control  Diabetes history: DM2 Outpatient Diabetes medications: 70/30 38 units BID, Novolog 0-15 TID, Glipizide 10 mg BID, Jardiance 10 mg QD, metformin 1000 mg BID Current orders for Inpatient glycemic control: Levemir 50 units BID, Novolog 0-20 units TID + 8 units Q4H  Blood sugars above goal of 140-180 mg/dL  Inpatient Diabetes Program Recommendations:    Consider increasing Levemir to 55 units BID Consider Novolog 10 units Q4H (do not give if TF stopped or held)  Continue to follow.  Thank you. Lorenda Peck, RD, LDN, CDE Inpatient Diabetes Coordinator [phone]

## 2021-05-07 NOTE — Progress Notes (Addendum)
Patient ID: Jeffrey [name], male   DOB: 1976-07-18, 45 y.o.   MRN: [id number]     Advanced Heart Failure Rounding Note  PCP-Cardiologist: Glori Bickers, MD   Subjective:   04/30/21 Echo: EF < 20%, severe LV dilation, moderate RV dysfunction 05/05/21 Bidil increased to 1 tab tid. Started on eliquis. Febrile to 101.2. Started on zyvox+ cefepime   05/05/21 Sputum Cx: rare staph aureus.  Tmax of 99.8 since yesterday evening.  Remains intubated with an FiO2 of 70%. Frothy sputum.   Creatinine peaked at 4.65, now 2.05.  Lasix drip stopped due to hypernatremia. UOP of 1.9L x24 hours, but is now net positive 724.9 from a volume standpoint.  Co-ox is 70.8% with CVP of 6.  Dobutamine down to 1.  Intubated and sedated on Precedex and Fentanyl, but follows all commands appropriately.  No BM x5 days.    Objective:   Weight Range: (!) 136.9 kg Body mass index is 45.89 kg/m.   Vital Signs:   Temp:  [98.2 F (36.8 C)-101 F (38.3 C)] 98.2 F (36.8 C) (08/03 0400) Pulse Rate:  [63-77] 68 (08/03 0737) Resp:  [20-26] 22 (08/03 0737) BP: (94-151)/(62-90) 116/88 (08/03 0700) SpO2:  [88 %-97 %] 96 % (08/03 0737) Arterial Line BP: (95-160)/(59-91) 148/82 (08/03 0500) FiO2 (%):  [70 %-100 %] 70 % (08/03 0737) Weight:  [136.9 kg] 136.9 kg (08/03 0400) Last BM Date: 04/29/21  Weight change: Filed Weights   05/05/21 0100 05/06/21 0431 05/07/21 0400  Weight: (!) 137.8 kg (!) 137.4 kg (!) 136.9 kg    Intake/Output:   Intake/Output Summary (Last 24 hours) at 05/07/2021 0749 Last data filed at 05/07/2021 0600 Gross per 24 hour  Intake 2659.61 ml  Output 1935 ml  Net 724.61 ml       Physical Exam   CVP 6 General:  Intubated HEENT: ETT and OGT in place Neck: supple. JVP difficult to assess.  Carotids 2+ bilat; no bruits. No lymphadenopathy or thryomegaly appreciated. LIJ Cor: PMI nondisplaced. Regular rate & rhythm. No rubs, gallops or murmurs. Lungs: Coarse and diminished Abdomen: obese,  soft, nontender, nondistended. No hepatosplenomegaly. No bruits or masses. + BS.  TF infusing  Extremities: no cyanosis, clubbing, rash, edema Neuro: Sedated. MAE and follows commands.  Telemetry   NSR 60-80s with occasional PACs (personally reviewed)   Labs    CBC Recent Labs    05/05/21 0508 05/05/21 0535 05/06/21 0324 05/06/21 0330  WBC 10.2  --   --  11.0*  NEUTROABS 6.3  --   --   --   HGB 13.1   < > 12.9* 13.1  HCT 41.2   < > 38.0* 42.0  MCV 85.5  --   --  87.3  PLT 215  --   --  223   < > = values in this interval not displayed.    Basic Metabolic Panel Recent Labs    05/06/21 0330 05/07/21 0346  NA 151* 151*  K 3.6 3.7  CL 111 114*  CO2 31 29  GLUCOSE 206* 281*  BUN 65* 66*  CREATININE 2.21* 2.05*  CALCIUM 10.6* 10.4*  MG 2.3 2.4    Liver Function Tests Recent Labs    05/05/21 0508  AST 19  ALT 30  ALKPHOS 41  BILITOT 1.1  PROT 6.9  ALBUMIN 2.3*    No results for input(s): LIPASE, AMYLASE in the last 72 hours. Cardiac Enzymes No results for input(s): CKTOTAL, CKMB, CKMBINDEX, TROPONINI in the last 72  hours.  BNP: BNP (last 3 results) Recent Labs    11/21/20 2222 04/29/21 1247  BNP 65.0 1,407.7*     ProBNP (last 3 results) No results for input(s): PROBNP in the last 8760 hours.   D-Dimer No results for input(s): DDIMER in the last 72 hours. Hemoglobin A1C No results for input(s): HGBA1C in the last 72 hours.  Fasting Lipid Panel No results for input(s): CHOL, HDL, LDLCALC, TRIG, CHOLHDL, LDLDIRECT in the last 72 hours.  Thyroid Function Tests No results for input(s): TSH, T4TOTAL, T3FREE, THYROIDAB in the last 72 hours.  Invalid input(s): FREET3   Other results:   Imaging    DG CHEST PORT 1 VIEW  Result Date: 05/06/2021 CLINICAL DATA:  Shortness of breath, chest pain EXAM: PORTABLE CHEST 1 VIEW COMPARISON:  05/05/2021 FINDINGS: Support devices are stable. Cardiomegaly with vascular congestion. Layering bilateral  effusions. Bibasilar atelectasis. Probable mild pulmonary edema. IMPRESSION: Stable support devices. Stable pulmonary edema and layering effusions. Electronically Signed   By: Rolm Baptise M.D.   On: 05/06/2021 10:16     Medications:     Scheduled Medications:  ALPRAZolam  0.5 mg Per Tube BID   apixaban  5 mg Per Tube BID   chlorhexidine gluconate (MEDLINE KIT)  15 mL Mouth Rinse BID   Chlorhexidine Gluconate Cloth  6 each Topical Daily   docusate  100 mg Per Tube BID   feeding supplement (PROSource TF)  45 mL Per Tube TID   free water  250 mL Per Tube Q4H   insulin aspart  0-20 Units Subcutaneous Q4H   insulin aspart  8 Units Subcutaneous Q4H   insulin detemir  50 Units Subcutaneous BID   isosorbide-hydrALAZINE  1 tablet Per Tube TID   mouth rinse  15 mL Mouth Rinse 10 times per day   pantoprazole sodium  40 mg Per Tube Daily   polyethylene glycol  17 g Per Tube Daily   sodium chloride flush  10-40 mL Intracatheter Q12H   sodium chloride flush  3 mL Intravenous Q12H    Infusions:  sodium chloride Stopped (04/30/21 2254)   amiodarone 30 mg/hr (05/07/21 0600)   ceFEPime (MAXIPIME) IV Stopped (05/06/21 2231)   dexmedetomidine (PRECEDEX) IV infusion 0.9 mcg/kg/hr (05/07/21 0600)   DOBUTamine 1 mcg/kg/min (05/07/21 0600)   feeding supplement (VITAL AF 1.2 CAL) 1,000 mL (05/06/21 1517)   fentaNYL infusion INTRAVENOUS 75 mcg/hr (05/07/21 0600)   linezolid (ZYVOX) IV Stopped (05/07/21 0118)    PRN Medications: sodium chloride, acetaminophen (TYLENOL) oral liquid 160 mg/5 mL, fentaNYL (SUBLIMAZE) injection, fentaNYL (SUBLIMAZE) injection, nitroGLYCERIN, ondansetron (ZOFRAN) IV, sodium chloride flush, sodium chloride flush  Assessment/Plan   1. Atrial fibrillation: Paroxysmal - Suspect AF with RVR drove CHF exacerbation.  Patient has been on amiodarone and Eliquis at home, has missed some Eliquis doses. Emergent TEE-guided DCCV 7/27 due to inability to control HR - Maintaining NSR.   - Continue amiodarone gtt 30 mg/hr with eventual transition back to PO (100 mg daily at home) - Continue Eliquis.  - Eventually needs consideration for AF ablation. - Keep K of at least 4 and Mg of at least 2  2. 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  Echo in 9/21 with EF 20-25%.  Echo this admission with EF < 20%, severe LV dilation, moderate RV dysfunction.   7/27 intubated with pulmonary edema and required addition of NE. Off NE - CO-OX 70.8%, CVP of 6. Dobutamine at 1 --  consider weaning off  Weight continues decreasing gradually  -  Continue BiDil  1 TID. Consider resuming Delene Loll, Jardiance and spironolactone closer to discharge pending clinical course  - No beta blocker in the setting of shock   3. AKI on CKD stage 3  - Has solitary kidney.   - Suspect cardiorenal syndrome/low output with AF/RVR in setting of cardiomyopathy.  Creatinine peaked at 4.65, now 2 - Baseline creatinine is 1.5-1.7  - Urinary obstruction likely played a role, doing better now that urology inserted foley catheter. - Dobutamine at 1  - UOP of 1.9L x24 hours.  Lasix drip stopped due to hypernatremia   4. OSA - CPAP after extubation   5. Acute hypoxemic respiratory failure - Suspect primarily CHF, but resp culture + for rare S. Aureus. - Failed vent wean and ultimately required increased vent settings on 8/2.  Became febrile on 8/1 and started on linezolid and cefepime  6. Fever - Became febrile to 101.2 on 8/1, now resolved with Tmax of 99.8 overnight - As noted above, resp culture with rate S. Aureus. Concern for possible RLL infectious process  - Continue cefepime and linezolid  - Foley removed  - Continue to monitor   7. Hypernatremia  Sodium 151. Receiving free water flushes of 250 mL q 4 hours  8. Constipation - No BM x5 days - Will add sorbitol  Darcella Gasman, NP  05/07/2021, 7:49 AM  Advanced Heart Failure Team Pager  [phone] (M-F; 7a - 5p)  Please contact [facility] for night-coverage after hours (5p -7a ) and weekends on [email or web address]  Agree with above. Awake on vent following commands but remains on FiO2 70%. On broad spectrum abx for fevers and worsening secretions.   On DBA at 1. Coox 71%. Creatinine stable at 2. CVP 6  Off lasix due to hypernatremia   General:  awake on vent HEENT: normal + ETT Neck: supple. JVP 6 Carotids 2+ bilat; no bruits. No lymphadenopathy or thryomegaly appreciated. Cor: PMI nondisplaced. Regular rate & rhythm. No rubs, gallops or murmurs. Lungs: clear Abdomen: soft, nontender, nondistended. No hepatosplenomegaly. No bruits or masses. Good bowel sounds. Extremities: no cyanosis, clubbing, rash, edema Neuro: follows commands on vent   Remains with high vent requirements despite optimization of volume status (weight down 30 pounds from previous baseline). Suspect HF is not driving high vent needs. Can stop DBA.   I worry about amio toxicity as he has been on long-term. Check ESR and PCT.  Will d/w pulmonary/CCM regarding next steps ? Bronch/CT/RHC/trial of steroids.   If ESR is markedly elevated would favor trial of empiric high-dose steroids. Will stop amio for now.    CRITICAL CARE Performed by: Glori Bickers  Total critical care time: 45 minutes  Critical care time was exclusive of separately billable procedures and treating other patients.  Critical care was necessary to treat or prevent imminent or life-threatening deterioration.  Critical care was time spent personally by me (independent of midlevel providers or residents) on the following activities: development of treatment plan with patient and/or surrogate as well as nursing, discussions with consultants, evaluation of patient's response to treatment, examination of patient, obtaining history from patient or surrogate, ordering and performing treatments and interventions, ordering and review of  laboratory studies, ordering and review of radiographic studies, pulse oximetry and re-evaluation of patient's condition.  Glori Bickers, MD  8:22 AM

## 2021-05-07 NOTE — Progress Notes (Signed)
Pt transported to and from CT scan on the ventilator without incident. 

## 2021-05-07 NOTE — Progress Notes (Signed)
[facility] Progress Note Patient Name: Jeffrey [name] DOB: September 01, 1976 MRN: [id number]   Date of Service  05/07/2021  HPI/Events of Note  Hypoxia - RT concerned about sats = 90%.  eICU Interventions  Plan: Increase PEEP to 10. Lasix 40 mg IV X 1.      Intervention Category Major Interventions: Hypoxemia - evaluation and management  Lenoir Facchini Cornelia Copa 05/07/2021, 11:27 PM

## 2021-05-07 NOTE — Progress Notes (Signed)
NAME:  Jeffrey [name], MRN:  [id number], DOB:  October 06, 1975, LOS: 7 ADMISSION DATE:  04/29/2021, CONSULTATION DATE:  04/30/2021 REFERRING MD:  Marigene Ehlers CHIEF COMPLAINT: Dyspnea  History of Present Illness:  45 year old man with extensive cardiac hx as below presenting with decompensated heart failure in setting of afib.  Cardioverted earlier today but unforuntately worse respiratory distress.  + orthopnea.  Saturating 80s on NRB and RR 40s, Aox3, anxious.  Pertinent  Medical History  NICM with AICD in place, Hx Afib, Hx ventricular tachycardia, HTN, T2DM, R renal cell carcinoma s/p nephrectomy in 2014, CKD 3a, OSA on CPAP, Hx of traumatic intubation.  Significant Hospital Events: Including procedures, antibiotic start and stop dates in addition to other pertinent events   7/26 admitted. 7/27 cardioversion, worsening resp distress; intubation. 7/27 Echo with EF less than 20% (global HK) and grade 3 DD.  RV systolic function mod reduced. 3/76 foley complication, new catheter placed by urology with good output. 7/29 Continued urine output and creatinine plateau; urology consult: leave foley in for 7 days 7/30 Failed SBT 16/8 PSV 7/31 weaning on 16/8 PSV, diuresing, lots of secretions, more awake, Coox 76% 8/1 attempted SAT SBT this morning.  Still not awake enough. Started precedex. Fever up to 101.2 with thick secretions. Respiratory culture with rare Staph Aureus. Started on linezolid and cefepime. Eliquis restarted. 8/2 Desaturated to 89% on FIO2 of 85%. Increased FIO2 100% and PEEP 8.  Interim History / Subjective:   Increased FiO2 requirements overnight.  Also remains on mechanical vent support.  Critically ill-appearing  Objective   Blood pressure (!) 114/94, pulse 68, temperature 99.3 F (37.4 C), temperature source Oral, resp. rate (!) 24, height '5\' 8"'  (1.727 m), weight (!) 136.9 kg, SpO2 95 %. CVP:  [5 mmHg-8 mmHg] 6 mmHg  Vent Mode: PRVC FiO2 (%):  [70 %-90 %] 70 % Set Rate:  [18  bmp] 18 bmp Vt Set:  [550 mL] 550 mL PEEP:  [8 cmH20] 8 cmH20 Plateau Pressure:  [22 cmH20-26 cmH20] 22 cmH20   Intake/Output Summary (Last 24 hours) at 05/07/2021 1236 Last data filed at 05/07/2021 0600 Gross per 24 hour  Intake 1689.69 ml  Output 1275 ml  Net 414.69 ml   Filed Weights   05/05/21 0100 05/06/21 0431 05/07/21 0400  Weight: (!) 137.8 kg (!) 137.4 kg (!) 136.9 kg    Examination: General: Obese male chronically ill-appearing intubated on mechanical life support HENT: NCAT, endotracheal tube is in place Lungs: Bilateral mechanically ventilated breath sounds Cardiovascular: Regular rate rhythm, S1-S2 Abdomen: Soft, nontender nondistended, obese pannus Extremities: No significant edema Neuro: Alert to voice, opens eyes to stimulation GU: Foley removed yesterday now condom cath  Resolved Hospital Problem list     Assessment & Plan:   Acute decompensated systolic heart failure Cardiogenic shock Diuresis stopped Dobutamine stopped Continue to monitor COOX Off Levophed at this time Heart failure regimen per heart failure team.   Atrial fibrillation -telemetry plus amiodarone plus apixaban   Acute respiratory failure with hypoxemia due to acute pulmonary edemaBilateral pleural effusions MSSA tracheobronchitis, possible pneumonia Stop linezolid De-escalate to Ancef Continue adult mechanical vent support Labored prophylaxis head of bed elevated Daily SAT SBT. His hypoxemia I dont really understand  Start with CT Chest  May consider steroid trial after that. 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  Plan: Follow-up visit metabolic panel, urine output  Morbid obesity  Outpatient  Hypernatremia Sodium 151 this morning. Free  water   DM2 with hyperglycemia SSI plus CBGs   Need for sedation for mechanical ventilation PAD guidelines Precedex Wean from fentanyl   Best Practice (right click and "Reselect all SmartList  Selections" daily)   Diet/type: NPO DVT prophylaxis: DOAC GI prophylaxis: PPI Lines: Central line and yes and it is still needed Foley:  Yes, and it is no longer needed and removal ordered  Code Status:  full code Last date of multidisciplinary goals of care discussion [per primary]  Labs   CBC: Recent Labs  Lab 05/02/21 0245 05/03/21 0351 05/03/21 0408 05/04/21 0340 05/05/21 0508 05/05/21 0535 05/06/21 0324 05/06/21 0330  WBC 10.9*  --  9.6 9.4 10.2  --   --  11.0*  NEUTROABS  --   --   --  5.9 6.3  --   --   --   HGB 13.9   < > 13.8 13.5 13.1 16.0 12.9* 13.1  HCT 40.7   < > 40.2 42.1 41.2 47.0 38.0* 42.0  MCV 81.6  --  80.9 85.2 85.5  --   --  87.3  PLT 165  --  192 185 215  --   --  223   < > = values in this interval not displayed.    Basic Metabolic Panel: Recent Labs  Lab 05/01/21 0334 05/01/21 1356 05/02/21 0245 05/02/21 1652 05/03/21 0407 05/03/21 1600 05/04/21 0340 05/04/21 1549 05/05/21 0508 05/05/21 0535 05/05/21 1715 05/06/21 0324 05/06/21 0330 05/07/21 0346  NA 139   < > 136  135   < > 143   < > 145 147* 149* 151* 151* 154* 151* 151*  K 4.3   < > 3.4*  3.3*   < > 4.1   < > 4.0 4.1 3.7 3.6 3.9 3.4* 3.6 3.7  CL 106   < > 103  100   < > 110   < > 109 109 109  --  112*  --  111 114*  CO2 24   < > 21*  21*   < > 23   < > '28 28 31  ' --  30  --  31 29  GLUCOSE 144*   < > 292*  295*   < > 197*   < > 198* 208* 191*  --  198*  --  206* 281*  BUN 54*   < > 63*  60*   < > 53*   < > 52* 53* 57*  --  61*  --  65* 66*  CREATININE 4.65*   < > 4.57*  4.58*   < > 2.94*   < > 2.18* 2.04* 1.98*  --  2.02*  --  2.21* 2.05*  CALCIUM 9.3   < > 9.3  9.4   < > 9.6   < > 9.8 10.0 10.3  --  10.4*  --  10.6* 10.4*  MG 2.3  --   --   --   --   --   --   --   --   --  2.3  --  2.3 2.4  PHOS  --   --  4.7*  --  3.4  --  4.7*  --   --   --   --   --   --   --    < > = values in this interval not displayed.   GFR: Estimated Creatinine Clearance: 61 mL/min (A) (by C-G  formula based on SCr of 2.05 mg/dL (H)). Recent  Labs  Lab 04/30/21 1741 05/01/21 0334 05/02/21 0245 05/03/21 0408 05/04/21 0340 05/05/21 0508 05/06/21 0330 05/07/21 0904  PROCALCITON 0.47  --   --   --   --   --   --  0.54  WBC  --   --    < > 9.6 9.4 10.2 11.0*  --   LATICACIDVEN 3.0* 2.4*  --   --   --   --   --   --    < > = values in this interval not displayed.    Liver Function Tests: Recent Labs  Lab 05/02/21 0245 05/03/21 0407 05/04/21 0340 05/05/21 0508  AST 19  --  17 19  ALT 61*  --  38 30  ALKPHOS 42  --  44 41  BILITOT 1.2  --  1.0 1.1  PROT 5.9*  --  6.5 6.9  ALBUMIN 2.4*  2.4* 2.3* 2.4* 2.3*   No results for input(s): LIPASE, AMYLASE in the last 168 hours. No results for input(s): AMMONIA in the last 168 hours.  ABG    Component Value Date/Time   PHART 7.454 (H) 05/06/2021 0324   PCO2ART 41.3 05/06/2021 0324   PO2ART 73 (L) 05/06/2021 0324   HCO3 28.8 (H) 05/06/2021 0324   TCO2 30 05/06/2021 0324   ACIDBASEDEF 1.0 05/01/2021 0107   O2SAT 70.8 05/07/2021 0346     Coagulation Profile: No results for input(s): INR, PROTIME in the last 168 hours.  Cardiac Enzymes: No results for input(s): CKTOTAL, CKMB, CKMBINDEX, TROPONINI in the last 168 hours.  HbA1C: HbA1c, POC (prediabetic range)  Date/Time Value Ref Range Status  06/24/2020 09:20 AM 7.5 (A) 5.7 - 6.4 % Final  03/19/2020 10:22 AM 8.1 (A) 5.7 - 6.4 % Final   HbA1c, POC (controlled diabetic range)  Date/Time Value Ref Range Status  06/24/2020 09:20 AM 7.5 (A) 0.0 - 7.0 % Final  03/19/2020 10:22 AM 8.1 (A) 0.0 - 7.0 % Final   HbA1c POC (<> result, manual entry)  Date/Time Value Ref Range Status  06/24/2020 09:20 AM 7.5 4.0 - 5.6 % Final  03/19/2020 10:22 AM 8.1 4.0 - 5.6 % Final   Hgb A1c MFr Bld  Date/Time Value Ref Range Status  04/30/2021 05:55 AM 9.4 (H) 4.8 - 5.6 % Final    Comment:    (NOTE)         Prediabetes: 5.7 - 6.4         Diabetes: >6.4         Glycemic control for  adults with diabetes: <7.0   10/28/2020 09:32 AM 8.6 (H) 4.8 - 5.6 % Final    Comment:             Prediabetes: 5.7 - 6.4          Diabetes: >6.4          Glycemic control for adults with diabetes: <7.0     CBG: Recent Labs  Lab 05/06/21 2018 05/06/21 2328 05/07/21 0403 05/07/21 0728 05/07/21 0750  GLUCAP 147* 161* 252* 235* 243*    Review of Systems:   Unable to assess given intubated status.  Past Medical History:  He,  has a past medical history of Anxiety, Aspirin allergy, Childhood asthma, Chronic systolic CHF (congestive heart failure) ([place]), CKD (chronic kidney disease) stage 2, GFR 60-89 ml/min, H/O vasectomy (12/2019), High cholesterol, Hypertension, Morbid obesity ([place]), Nephrolithiasis, OSA on CPAP, Paroxysmal atrial fibrillation ([place]), Presumed NICM, Renal cell carcinoma ([place]), Troponin level elevated, Type  II diabetes mellitus ([place]), and Ventricular tachycardia ([place]).   Surgical History:   Past Surgical History:  Procedure Laterality Date   APPENDECTOMY  07/2004   BIOPSY  02/10/2021   Procedure: BIOPSY;  Surgeon: Gatha Mayer, MD;  Location: [facility] ENDOSCOPY;  Service: Endoscopy;;   CARDIAC CATHETERIZATION N/A 05/17/2015   Procedure: Right/Left Heart Cath and Coronary Angiography;  Surgeon: Jolaine Artist, MD;  Location: [place] [facility];  Service: Cardiovascular;  Laterality: N/A;   CARDIOVERSION N/A 04/30/2021   Procedure: CARDIOVERSION;  Surgeon: Larey Dresser, MD;  Location[facility] ENDOSCOPY;  Service: Cardiovascular;  Laterality: N/A;   COLONOSCOPY WITH PROPOFOL N/A 02/10/2021   Procedure: COLONOSCOPY WITH PROPOFOL;  Surgeon: Gatha Mayer, MD;  Location: [facility] ENDOSCOPY;  Service: Endoscopy;  Laterality: N/A;   EP IMPLANTABLE DEVICE N/A 05/17/2015   Procedure: SubQ ICD Implant;  Surgeon: Will Meredith Leeds, MD;  Location: [place] [facility] LAB;  Service: Cardiovascular;  Laterality: N/A;   ESOPHAGOGASTRODUODENOSCOPY (EGD) WITH PROPOFOL N/A 02/10/2021   Procedure:  ESOPHAGOGASTRODUODENOSCOPY (EGD) WITH PROPOFOL;  Surgeon: Gatha Mayer, MD;  Location: [facility];  Service: Endoscopy;  Laterality: N/A;   ROBOTIC ASSISTED LAPAROSCOPIC LYSIS OF ADHESION  01/13/2013   Procedure: ROBOTIC ASSISTED LAPAROSCOPIC LYSIS OF ADHESION EXTENSIVE;  Surgeon: Alexis Frock, MD;  Location: [facility] ORS;  Service: Urology;;   ROBOTIC ASSITED PARTIAL NEPHRECTOMY Right 01/13/2013   Procedure: ROBOTIC ASSITED PARTIAL NEPHRECTOMY CONVERTED TO ROBOTIC ASSISTED RIGHT RADICAL NEPHRECTOMY;  Surgeon: Alexis Frock, MD;  Location: [facility];  Service: Urology;  Laterality: Right;   SUBQ ICD CHANGEOUT N/A 05/16/2020   Procedure: SUBQ ICD CHANGEOUT;  Surgeon: [name], MD;  Location: [place] [facility] LAB;  Service: Cardiovascular;  Laterality: N/A;   SUBQ ICD CHANGEOUT N/A 05/15/2020   Procedure: SUBQ ICD CHANGEOUT;  Surgeon: [name], MD;  Location: [place] [facility];  Service: Cardiovascular;  Laterality: N/A;   TEE WITHOUT CARDIOVERSION N/A 04/30/2021   Procedure: TRANSESOPHAGEAL ECHOCARDIOGRAM (TEE);  Surgeon: Larey Dresser, MD;  Location[facility] ENDOSCOPY;  Service: Cardiovascular;  Laterality: N/A;   VASECTOMY       Social History:   reports that he has been smoking cigarettes. He has a 5.50 pack-year smoking history. He has never used smokeless tobacco. He reports current alcohol use. He reports current drug use. Frequency: 14.00 times per week. Drug: Marijuana.   Family History:  His family history includes Diabetes in his maternal aunt; Hypertension in his mother. There is no history of Heart attack, Stroke, Colon cancer, Pancreatic cancer, Stomach cancer, Liver cancer, or Esophageal cancer.   Allergies Allergies  Allergen Reactions   Aspirin Shortness Of Breath, Itching and Rash     Burning sensation (Patient reports he tolerates other NSAIDS)    Bee Venom Hives and Swelling   Lisinopril Cough   Tomato Rash     Home Medications  Prior to Admission medications    Medication Sig Start Date End Date Taking? Authorizing Provider  allopurinol (ZYLOPRIM) 100 MG tablet Take 1 tablet (100 mg total) by mouth daily. 01/09/21  Yes Clegg, Amy D, NP  amiodarone (PACERONE) 100 MG tablet TAKE 1 TABLET (100 MG TOTAL) BY MOUTH DAILY. Patient taking differently: Take 100 mg by mouth daily. 01/09/21 01/09/22 Yes Clegg, Amy D, NP  apixaban (ELIQUIS) 5 MG TABS tablet TAKE 1 TABLET (5 MG TOTAL) BY MOUTH 2 (TWO) TIMES DAILY. RESUME WITH EVENING DOSE ON SUNDAY Patient taking differently: Take 5 mg by mouth 2 (two)  times daily. 01/09/21 01/09/22 Yes Clegg, Amy D, NP  busPIRone (BUSPAR) 10 MG tablet TAKE 1 TABLET (10 MG TOTAL) BY MOUTH 2 (TWO) TIMES DAILY. Patient taking differently: Take 10 mg by mouth 2 (two) times daily. 06/24/20 06/24/21 Yes [name], FNP  carvedilol (COREG) 6.25 MG tablet TAKE 1 TABLET (6.25 MG TOTAL) BY MOUTH 2 (TWO) TIMES DAILY. Patient taking differently: Take 6.25 mg by mouth 2 (two) times daily. 08/07/20 08/07/21 Yes Bensimhon, Shaune Pascal, MD  cetirizine (ZYRTEC) 10 MG tablet Take 1 tablet (10 mg total) by mouth daily. 06/27/20  Yes [name], FNP  empagliflozin (JARDIANCE) 10 MG TABS tablet TAKE 1 TABLET (10 MG TOTAL) BY MOUTH DAILY BEFORE BREAKFAST. Patient taking differently: Take 10 mg by mouth daily before breakfast. 12/06/20 12/06/21 Yes Milford, Maricela Bo, FNP  famotidine (PEPCID) 40 MG tablet TAKE 1 TABLET (40 MG TOTAL) BY MOUTH DAILY AS NEEDED FOR HEARTBURN OR INDIGESTION. Patient taking differently: Take 40 mg by mouth daily as needed for indigestion or heartburn. 12/25/20 12/25/21 Yes Gatha Mayer, MD  ferrous sulfate 325 (65 FE) MG tablet Take 1 tablet (325 mg total) by mouth daily with breakfast. 03/04/21  Yes Gatha Mayer, MD  fluticasone [facility]) 50 MCG/ACT nasal spray Place 2 sprays into both nostrils daily. Patient taking differently: Place 2 sprays into both nostrils daily as needed for allergies. 09/18/19  Yes [name], FNP   gabapentin (NEURONTIN) 300 MG capsule Take 1 capsule (300 mg total) by mouth 3 (three) times daily. 06/27/20  Yes [name], FNP  glipiZIDE (GLUCOTROL) 10 MG tablet Take 1 tablet (10 mg total) by mouth 2 (two) times daily before a meal. 04/22/21  Yes King, Diona Foley, NP  insulin aspart (NOVOLOG) 100 UNIT/ML FlexPen INJECT 0-15 UNIT(S) INTO THE SKIN THREE TIMES DAILY WITH MEALS VIA SLIDING SCALE Patient taking differently: Inject 0-15 Units into the skin 3 (three) times daily with meals. 11/15/20 11/15/21 Yes [name], FNP  insulin NPH-regular Human (70-30) 100 UNIT/ML injection INJECT 38 UNITS INTO THE SKIN 2 (TWO) TIMES DAILY WITH A MEAL. Patient taking differently: Inject 38 Units into the skin 2 (two) times daily with a meal. 06/27/20 06/27/21 Yes [name], FNP  metFORMIN (GLUCOPHAGE) 1000 MG tablet Take 1 tablet (1,000 mg total) by mouth 2 (two) times daily with a meal. 01/09/21  Yes Clegg, Amy D, NP  oxyCODONE (ROXICODONE) 5 MG immediate release tablet Take 1 tablet (5 mg total) by mouth every 6 (six) hours as needed for up to 10 doses for breakthrough pain. 04/12/21  Yes Curatolo, Adam, DO  sacubitril-valsartan (ENTRESTO) 49-51 MG TAKE 1 TABLET BY MOUTH 2 (TWO) TIMES DAILY. 01/24/21  Yes Clegg, Amy D, NP  spironolactone (ALDACTONE) 25 MG tablet Take 1 tablet (25 mg total) by mouth daily. 01/09/21 08/07/21 Yes Clegg, Amy D, NP  torsemide (DEMADEX) 20 MG tablet TAKE 1 TABLET (20 MG TOTAL) BY MOUTH DAILY. Patient taking differently: Take 20 mg by mouth daily. 01/09/21 01/09/22 Yes Clegg, Amy D, NP  traZODone (DESYREL) 100 MG tablet Take 1 tablet (100 mg total) by mouth at bedtime. For sleep 03/05/21  Yes Vevelyn Francois, NP  Vitamin D, Ergocalciferol, (DRISDOL) 1.25 MG (50000 UNIT) CAPS capsule TAKE 1 CAPSULE (50,000 UNITS TOTAL) BY MOUTH ONCE A WEEK. Patient taking differently: Take 50,000 Units by mouth every Monday. 10/29/20 10/29/21 Yes [name], FNP  Accu-Chek FastClix Lancets  MISC USE AS DIRECTED FOUR TIMES DAILY  01/29/21 01/29/22  Vevelyn Francois, NP  blood glucose meter kit and supplies Use up to four times daily as directed. 01/27/21   Vevelyn Francois, NP  Continuous Blood Gluc Receiver (FREESTYLE LIBRE 14 DAY READER) DEVI 1 each by Does not apply route 3 (three) times daily between meals as needed. 03/19/20   [name], FNP  Continuous Blood Gluc Sensor (GUARDIAN SENSOR 3) MISC 1 Device by Does not apply route 3 (three) times daily between meals as needed. 03/25/20   [name], FNP  Insulin Pen Needle 31G X 5 MM MISC USE AS DIRECTED 11/27/20 11/27/21  [name], FNP  Insulin Syringe-Needle U-100 31G X 5/16" 0.5 ML MISC USE TO INJECT INSULIN TWICE DAILY. 09/17/20 09/17/21  [name], FNP  Magnesium Oxide 200 MG TABS Take 100 mg by mouth daily.    [provider]  predniSONE (DELTASONE) 10 MG tablet take 2 tablets by mouth for 2 days, then 1 tab for 2 days, then stop Patient not taking: Reported on 04/29/2021 04/16/21   Fenton Foy, NP  Colchicine (MITIGARE) 0.6 MG CAPS Take 1 tablet by mouth 2 (two) times daily as needed (gout). 06/27/20 11/26/20  [name], FNP    This patient is critically ill with multiple organ system failure; which, requires frequent high complexity decision making, assessment, support, evaluation, and titration of therapies. This was completed through the application of advanced monitoring technologies and extensive interpretation of multiple databases. During this encounter critical care time was devoted to patient care services described in this note for 45 minutes.  Garner Nash, DO [facility] 05/07/2021 12:36 PM

## 2021-05-07 NOTE — Progress Notes (Signed)
Pharmacy Antibiotic Note  Jeffrey [name] is a 45 y.o. male admitted on 04/29/2021 and now with fevers and thick secretions concerning for PNA. Linezolid and cefepime started, trach aspirate now growing pan-sensitive MSSA so will narrow to cefazolin.  Plan: -Stop linezolid/cefepime -Change to cefazolin 2g IV q8h   Height: '5\' 8"'$  (172.7 cm) Weight: (!) 136.9 kg (301 lb 13 oz) IBW/kg (Calculated) : 68.4  Temp (24hrs), Avg:99.3 F (37.4 C), Min:98.2 F (36.8 C), Max:101 F (38.3 C)  Recent Labs  Lab 04/30/21 1741 05/01/21 0334 05/01/21 1356 05/02/21 0245 05/02/21 1652 05/03/21 0408 05/03/21 1600 05/04/21 0340 05/04/21 1549 05/05/21 0508 05/05/21 1715 05/06/21 0330 05/07/21 0346  WBC  --   --   --  10.9*  --  9.6  --  9.4  --  10.2  --  11.0*  --   CREATININE  --  4.65*   < > 4.57*  4.58*   < >  --    < > 2.18* 2.04* 1.98* 2.02* 2.21* 2.05*  LATICACIDVEN 3.0* 2.4*  --   --   --   --   --   --   --   --   --   --   --    < > = values in this interval not displayed.     Estimated Creatinine Clearance: 61 mL/min (A) (by C-G formula based on SCr of 2.05 mg/dL (H)).    Allergies  Allergen Reactions   Aspirin Shortness Of Breath, Itching and Rash     Burning sensation (Patient reports he tolerates other NSAIDS)    Bee Venom Hives and Swelling   Lisinopril Cough   Tomato Rash    Antimicrobials this admission: Cefazolin 8/3 >> Cefepime 7/28 >> 7/28; restart 8/1 >>8/3 Zyvox 7/28 >> 7/28; restart 8/1 >>8/3 Rocephin 7/29 x 1   Microbiology results: 7/26 Flu/COVID >> neg 7/27 MRSA PCR >> neg 7/31 RCx (TA) >> rare staph aureus (pending sens)  Thank you for allowing pharmacy to be a part of this patient's care.   Arrie Senate, PharmD, BCPS, [facility] Clinical Pharmacist [phone] Please check AMION for all [place] numbers 05/07/2021

## 2021-05-07 NOTE — Procedures (Signed)
Cortrak  Person Inserting Tube:  Krisi Azua, RD Tube Type:  Cortrak - 43 inches Tube Size:  10 Tube Location:  Right nare Initial Placement:  Stomach Secured by: Bridle Technique Used to Measure Tube Placement:  Marking at nare/corner of mouth Cortrak Secured At:  65 cm  Cortrak Tube Team Note:  Consult received to place a Cortrak feeding tube.   X-ray is required, abdominal x-ray has been ordered by the Cortrak team. Please confirm tube placement before using the Cortrak tube.   If the tube becomes dislodged please keep the tube and contact the Cortrak team at [email or web address] (password [email or web address]) for replacement.  If after hours and replacement cannot be delayed, place a NG tube and confirm placement with an abdominal x-ray.   Mariana Single MS, RD, LDN, CNSC Clinical Nutrition Pager listed in [place]

## 2021-05-07 NOTE — Progress Notes (Signed)
ESR of 128 noted.  High suspicion for amiodarone toxicity -- had been taking PO amio prior to admission. Amiodarone drip stopped this morning.  High-dose steroids started -- 60 mg Prednisone daily followed by taper when there is noted improvement.  D/W Dr. Bensimhon.   

## 2021-05-08 ENCOUNTER — Inpatient Hospital Stay (HOSPITAL_COMMUNITY): Payer: Medicaid Other

## 2021-05-08 DIAGNOSIS — I5023 Acute on chronic systolic (congestive) heart failure: Secondary | ICD-10-CM | POA: Diagnosis not present

## 2021-05-08 DIAGNOSIS — I480 Paroxysmal atrial fibrillation: Secondary | ICD-10-CM | POA: Diagnosis not present

## 2021-05-08 DIAGNOSIS — I517 Cardiomegaly: Secondary | ICD-10-CM | POA: Diagnosis not present

## 2021-05-08 DIAGNOSIS — R570 Cardiogenic shock: Secondary | ICD-10-CM | POA: Diagnosis not present

## 2021-05-08 DIAGNOSIS — N170 Acute kidney failure with tubular necrosis: Secondary | ICD-10-CM | POA: Diagnosis not present

## 2021-05-08 DIAGNOSIS — J90 Pleural effusion, not elsewhere classified: Secondary | ICD-10-CM | POA: Diagnosis not present

## 2021-05-08 DIAGNOSIS — N1831 Chronic kidney disease, stage 3a: Secondary | ICD-10-CM | POA: Diagnosis not present

## 2021-05-08 DIAGNOSIS — R0902 Hypoxemia: Secondary | ICD-10-CM | POA: Diagnosis not present

## 2021-05-08 DIAGNOSIS — J9601 Acute respiratory failure with hypoxia: Secondary | ICD-10-CM | POA: Diagnosis not present

## 2021-05-08 LAB — BASIC METABOLIC PANEL
Anion gap: 10 (ref 5–15)
Anion gap: 8 (ref 5–15)
BUN: 75 mg/dL — ABNORMAL HIGH (ref 6–20)
BUN: 86 mg/dL — ABNORMAL HIGH (ref 6–20)
CO2: 26 mmol/L (ref 22–32)
CO2: 24 mmol/L (ref 22–32)
Calcium: 10.9 mg/dL — ABNORMAL HIGH (ref 8.9–10.3)
Calcium: 10.8 mg/dL — ABNORMAL HIGH (ref 8.9–10.3)
Chloride: 112 mmol/L — ABNORMAL HIGH (ref 98–111)
Chloride: 120 mmol/L — ABNORMAL HIGH (ref 98–111)
Creatinine, Ser: 2.15 mg/dL — ABNORMAL HIGH (ref 0.61–1.24)
Creatinine, Ser: 2.23 mg/dL — ABNORMAL HIGH (ref 0.61–1.24)
GFR, Estimated: 38 mL/min — ABNORMAL LOW (ref >60)
GFR, Estimated: 36 mL/min — ABNORMAL LOW (ref >60)
Glucose, Bld: 301 mg/dL — ABNORMAL HIGH (ref 70–99)
Glucose, Bld: 148 mg/dL — ABNORMAL HIGH (ref 70–99)
Potassium: 4.1 mmol/L (ref 3.5–5.1)
Potassium: 3.2 mmol/L — ABNORMAL LOW (ref 3.5–5.1)
Sodium: 148 mmol/L — ABNORMAL HIGH (ref 135–145)
Sodium: 152 mmol/L — ABNORMAL HIGH (ref 135–145)

## 2021-05-08 LAB — CBC
HCT: 43.6 % (ref 39.0–52.0)
Hemoglobin: 13.2 g/dL (ref 13.0–17.0)
MCH: 26.7 pg (ref 26.0–34.0)
MCHC: 30.3 g/dL (ref 30.0–36.0)
MCV: 88.3 fL (ref 80.0–100.0)
Platelets: 325 10*3/uL (ref 150–400)
RBC: 4.94 MIL/uL (ref 4.22–5.81)
RDW: 18.1 % — ABNORMAL HIGH (ref 11.5–15.5)
WBC: 9.3 10*3/uL (ref 4.0–10.5)
nRBC: 0 % (ref 0.0–0.2)

## 2021-05-08 LAB — GLUCOSE, CAPILLARY
Glucose-Capillary: 105 mg/dL — ABNORMAL HIGH (ref 70–99)
Glucose-Capillary: 145 mg/dL — ABNORMAL HIGH (ref 70–99)
Glucose-Capillary: 160 mg/dL — ABNORMAL HIGH (ref 70–99)
Glucose-Capillary: 237 mg/dL — ABNORMAL HIGH (ref 70–99)
Glucose-Capillary: 246 mg/dL — ABNORMAL HIGH (ref 70–99)
Glucose-Capillary: 252 mg/dL — ABNORMAL HIGH (ref 70–99)
Glucose-Capillary: 300 mg/dL — ABNORMAL HIGH (ref 70–99)

## 2021-05-08 LAB — COOXEMETRY PANEL
Carboxyhemoglobin: 0.9 % (ref 0.5–1.5)
Methemoglobin: 0.8 % (ref 0.0–1.5)
O2 Saturation: 63.3 %
Total hemoglobin: 13.8 g/dL (ref 12.0–16.0)

## 2021-05-08 LAB — MAGNESIUM: Magnesium: 2.5 mg/dL — ABNORMAL HIGH (ref 1.7–2.4)

## 2021-05-08 MED ORDER — SULFAMETHOXAZOLE-TRIMETHOPRIM 800-160 MG PO TABS: 1.0000 | ORAL_TABLET | ORAL | Status: DC

## 2021-05-08 MED ORDER — POTASSIUM CHLORIDE CRYS ER 20 MEQ PO TBCR
40.0000 meq | EXTENDED_RELEASE_TABLET | ORAL | Status: AC
  Administered 2021-05-08 (×2): 40 meq via ORAL
  Filled 2021-05-08 (×2): qty 2

## 2021-05-08 MED ORDER — POLYETHYLENE GLYCOL 3350 17 G PO PACK
17.0000 g | PACK | Freq: Two times a day (BID) | ORAL | Status: DC
  Administered 2021-05-08: 17 g

## 2021-05-08 MED ORDER — FLEET ENEMA 7-19 GM/118ML RE ENEM
1.0000 | ENEMA | Freq: Once | RECTAL | Status: AC
  Administered 2021-05-08: 1 via RECTAL
  Filled 2021-05-08: qty 1

## 2021-05-08 MED ORDER — INSULIN ASPART 100 UNIT/ML IJ SOLN
10.0000 [IU] | INTRAMUSCULAR | Status: DC
  Administered 2021-05-08 – 2021-05-12 (×15): 10 [IU] via SUBCUTANEOUS

## 2021-05-08 MED ORDER — METHYLNALTREXONE BROMIDE 12 MG/0.6ML ~~LOC~~ SOLN
12.0000 mg | Freq: Once | SUBCUTANEOUS | Status: DC
  Filled 2021-05-08: qty 0.6

## 2021-05-08 MED ORDER — SORBITOL 70 % SOLN: 45.0000 mL | Freq: Once | Status: DC

## 2021-05-08 MED ORDER — ORAL CARE MOUTH RINSE
15.0000 mL | Freq: Two times a day (BID) | OROMUCOSAL | Status: DC
  Administered 2021-05-08 – 2021-05-22 (×21): 15 mL via OROMUCOSAL

## 2021-05-08 NOTE — Procedures (Signed)
Extubation Procedure Note  Patient Details:   Name: Jeffrey [name] DOB: [birth year] MRN: [id number]   Airway Documentation:    Vent end date: 05/08/21 Vent end time: 1203   Evaluation  O2 sats: stable throughout Complications: No apparent complications Patient did tolerate procedure well. Bilateral Breath Sounds: Clear, Diminished   Pt extubated to BIPAP 18/10/70% per MD order. Pt had positive cuff leak prior to extubation. No stridor noted. RT to continue to monitor.  Vilinda Blanks 05/08/2021, 12:08 PM

## 2021-05-08 NOTE — Progress Notes (Signed)
RT note. Patient placed on bipap for the night 13/6 60% sat 99% w/stable VS. Patient resting comfortable, RT will continue to monitor

## 2021-05-08 NOTE — Progress Notes (Signed)
Pt was taken off of BIPAP and placed on HHFNC. Unable to get proper seal with BIPAP at this time. CCM/ RN aware.

## 2021-05-08 NOTE — Progress Notes (Signed)
Patient ID: Jeffrey [name], male   DOB: August 27, 1976, 45 y.o.   MRN: [id number]     Advanced Heart Failure Rounding Note  PCP-Cardiologist: Glori Bickers, MD   Subjective:   04/30/21 Echo: EF < 20%, severe LV dilation, moderate RV dysfunction 05/05/21 Bidil increased to 1 tab tid. Started on eliquis. Febrile to 101.2. Started on zyvox+ cefepime   05/05/21 Sputum Cx: rare staph aureus    8/3: Linezolid and cefepime switched to Ancef   Remains intubated with an FiO2 of 80%.  PEEP increased overnight to 12 due to hypoxemia (oxygen saturation of only 90). S/p IV Lasix with 750 mL of UOP.  CXR this morning appears significantly worse when compared to previous one on 05/06/2021.  Concern for amiodarone toxicity due to fever, worsening respiratory failure and ESR of 128.  High-dose prednisone started yesterday with PCP prophylaxis added today; however, Prednisone now stopped per CCM team due to suspicion that hypoxia driven by RLL pneumonia/atelectasis   Creatinine peaked at 4.65, now 2.15 and relatively stable.  Lasix drip stopped on 8/2 due to hypernatremia. UOP of 1.5 x24 hours, but is now net positive 900 mL from a volume standpoint.  However, UOP not accurate due to unmeasured occurrences  Co-ox is 63.3% with CVP of 8.    Intubated and sedated, but follows all commands appropriately.  No BM x6 days.    Objective:   Weight Range: (!) 140.8 kg Body mass index is 47.2 kg/m.   Vital Signs:   Temp:  [97.8 F (36.6 C)-99.8 F (37.7 C)] 99.3 F (37.4 C) (08/04 0330) Pulse Rate:  [52-75] 61 (08/04 0700) Resp:  [15-28] 22 (08/04 0700) BP: (104-124)/(65-94) 121/92 (08/04 0700) SpO2:  [89 %-97 %] 95 % (08/04 0700) Arterial Line BP: (106-115)/(72-103) 115/72 (08/03 2000) FiO2 (%):  [70 %-80 %] 80 % (08/04 0313) Weight:  [140.8 kg] 140.8 kg (08/04 0500) Last BM Date: 05/01/21  Weight change: Filed Weights   05/06/21 0431 05/07/21 0400 05/08/21 0500  Weight: (!) 137.4 kg (!) 136.9 kg (!)  140.8 kg    Intake/Output:   Intake/Output Summary (Last 24 hours) at 05/08/2021 0752 Last data filed at 05/08/2021 0646 Gross per 24 hour  Intake 2404.69 ml  Output 1850 ml  Net 554.69 ml       Physical Exam   CVP 8 General:  Intubated HEENT: ETT and DHT in place Neck: supple. JVP difficult to assess.  Carotids 2+ bilat; no bruits. No lymphadenopathy or thryomegaly appreciated. LIJ Cor: PMI nondisplaced. Regular rate & rhythm. No rubs or gallops. Lungs: Clear  Abdomen: obese, soft, nontender, mildly distended. No hepatosplenomegaly. No bruits or masses. + BS.  TF infusing  Extremities: no cyanosis, clubbing, rash, edema Neuro: MAE and follows commands.  Telemetry   NSR 60-80s with PVCs and brief salvos of NSVT (personally reviewed)   Labs    CBC Recent Labs    05/06/21 0330 05/08/21 0416  WBC 11.0* 9.3  HGB 13.1 13.2  HCT 42.0 43.6  MCV 87.3 88.3  PLT 223 800    Basic Metabolic Panel Recent Labs    05/06/21 0330 05/07/21 0346 05/08/21 0416  NA 151* 151* 148*  K 3.6 3.7 4.1  CL 111 114* 112*  CO2 _0 GLUCOSE 206* 281* 301*  BUN 65* 66* 75*  CREATININE 2.21* 2.05* 2.15*  CALCIUM 10.6* 10.4* 10.9*  MG 2.3 2.4  --     Liver Function Tests No results for input(s): AST, ALT,  ALKPHOS, BILITOT, PROT, ALBUMIN in the last 72 hours.  No results for input(s): LIPASE, AMYLASE in the last 72 hours. Cardiac Enzymes No results for input(s): CKTOTAL, CKMB, CKMBINDEX, TROPONINI in the last 72 hours.  BNP: BNP (last 3 results) Recent Labs    11/21/20 2222 04/29/21 1247  BNP 65.0 1,407.7*     ProBNP (last 3 results) No results for input(s): PROBNP in the last 8760 hours.   D-Dimer No results for input(s): DDIMER in the last 72 hours. Hemoglobin A1C No results for input(s): HGBA1C in the last 72 hours.  Fasting Lipid Panel No results for input(s): CHOL, HDL, LDLCALC, TRIG, CHOLHDL, LDLDIRECT in the last 72 hours.  Thyroid Function Tests No  results for input(s): TSH, T4TOTAL, T3FREE, THYROIDAB in the last 72 hours.  Invalid input(s): FREET3   Other results:   Imaging    DG Abd 1 View  Result Date: 05/07/2021 CLINICAL DATA:  Feeding tube placement. EXAM: ABDOMEN - 1 VIEW COMPARISON:  Chest x-ray 05/06/2021. FINDINGS: Limited views of the abdomen obtained. Feeding tube noted with tip over the upper stomach. Multiple prominently dilated loops of small bowel noted. Colon is nondistended. Small-bowel obstruction cannot be excluded. Abdominal series can be obtained for further evaluation. IMPRESSION: 1. Limited views of the abdomen obtained. Feeding tube noted with tip over the upper stomach. 2. Multiple prominent dilated loops of small bowel noted. Colon is nondistended. Small-bowel obstruction cannot be excluded. Abdominal series can be obtained for further evaluation. Electronically Signed   By: Marcello Moores  Register   On: 05/07/2021 11:54   CT CHEST WO CONTRAST  Result Date: 05/07/2021 CLINICAL DATA:  Respiratory failure EXAM: CT CHEST WITHOUT CONTRAST TECHNIQUE: Multidetector CT imaging of the chest was performed following the standard protocol without IV contrast. COMPARISON:  05/06/2021, 12/28/2014 FINDINGS: Cardiovascular: Unenhanced imaging of the heart demonstrates mild cardiomegaly without pericardial effusion. Normal caliber of the thoracic aorta. Evaluation of the vascular lumen is limited without intravenous contrast. Left internal jugular catheter identified tip within the superior vena cava. Mediastinum/Nodes: Endotracheal tube identified, tip well above carina. Enteric catheter extends into the gastric lumen, tip within the gastric antrum. No pathologic adenopathy. Lungs/Pleura: Bilateral dependent lower lobe consolidation most consistent with atelectasis. No effusion or pneumothorax. Central airways are patent. Upper Abdomen: No acute abnormality. Musculoskeletal: No acute or destructive bony lesions. Single lead defibrillator  within the left lateral chest wall, lead anterior to the sternum. Reconstructed images demonstrate no additional findings. IMPRESSION: 1. Cardiomegaly. 2. Dependent lower lobe atelectasis. No airspace disease or effusion. 3. Support devices as above. Electronically Signed   By: Randa Ngo M.D.   On: 05/07/2021 15:51     Medications:     Scheduled Medications:  ALPRAZolam  0.5 mg Per Tube BID   apixaban  5 mg Per Tube BID   chlorhexidine gluconate (MEDLINE KIT)  15 mL Mouth Rinse BID   Chlorhexidine Gluconate Cloth  6 each Topical Daily   docusate  100 mg Per Tube BID   feeding supplement (PROSource TF)  45 mL Per Tube TID   free water  250 mL Per Tube Q4H   insulin aspart  0-20 Units Subcutaneous Q4H   insulin aspart  8 Units Subcutaneous Q4H   insulin detemir  50 Units Subcutaneous BID   isosorbide-hydrALAZINE  1 tablet Per Tube TID   mouth rinse  15 mL Mouth Rinse 10 times per day   pantoprazole sodium  40 mg Per Tube Daily   polyethylene glycol  17  g Per Tube Daily   predniSONE  60 mg Per Tube Q breakfast   sodium chloride flush  10-40 mL Intracatheter Q12H   sodium chloride flush  3 mL Intravenous Q12H    Infusions:  sodium chloride Stopped (04/30/21 2254)    ceFAZolin (ANCEF) IV 2 g (05/08/21 0617)   dexmedetomidine (PRECEDEX) IV infusion 0.8 mcg/kg/hr (05/08/21 0618)   feeding supplement (VITAL AF 1.2 CAL) 1,000 mL (05/08/21 0200)   fentaNYL infusion INTRAVENOUS 75 mcg/hr (05/08/21 0600)    PRN Medications: sodium chloride, acetaminophen (TYLENOL) oral liquid 160 mg/5 mL, fentaNYL (SUBLIMAZE) injection, fentaNYL (SUBLIMAZE) injection, nitroGLYCERIN, ondansetron (ZOFRAN) IV, sodium chloride flush, sodium chloride flush, sorbitol  Assessment/Plan   1. Atrial fibrillation: Paroxysmal - Suspect AF with RVR drove CHF exacerbation.  Patient has been on amiodarone and Eliquis at home, has missed some Eliquis doses. Emergent TEE-guided DCCV 7/27 due to inability to control  HR - Maintaining NSR.  - Amiodarone stopped due to concern for toxicity  - Continue Eliquis.  - Eventually needs consideration for AF ablation. - Keep K of at least 4 and Mg of at least 2  2. 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  Echo in 9/21 with EF 20-25%.  Echo this admission with EF < 20%, severe LV dilation, moderate RV dysfunction.   7/27: intubated with pulmonary edema and required addition of NE. Off NE - CO-OX 63.3%, CVP of 8. Dobutamine weaned off on 8/3  -  Continue BiDil  1 TID. Consider resuming Delene Loll, Jardiance and spironolactone closer to discharge pending clinical course  - No beta blocker in the setting of shock   3. Acute hypoxemic respiratory failure - Resp culture + for rare S. Aureus -- noted to be MSSA  - Failed vent wean and ultimately required increasing vent settings on 8/2-8/4.  Became febrile on 8/1 and started on linezolid and cefepime. Cefepime switched to Ancef and Linezolid discontinued on 8/3  - Initial concern for amiodarone toxicity -- amio stopped and steroids started, but teroids stopped this morning -- per CCM team, more likely atelectasis driving hypoxia.  Will defer to them.  - PEEP increased overnight to 12 due to hypoxemia. Attempting PS trial this morning per CCM  - CXR this AM notably worse   4. AKI on CKD stage 3  - Has solitary kidney.   - Suspect cardiorenal syndrome/low output with AF/RVR in setting of cardiomyopathy.  Creatinine peaked at 4.65, now 2 - Baseline creatinine is 1.5-1.7  - Urinary obstruction likely played a role. Urology placed foley catheter with improvement in UOP, but now has condom catheter in place. Foley removed due to concern for infection after fever developed - UOP of 1.5L x24 hours, but may not be accurate due to unmeasured occurrences.  Lasix drip stopped due to hypernatremia, but given x1 dose overnight   5. OSA - CPAP after extubation    6.  Fever - Became febrile to 101.2 on 8/1, now resolved - As noted above, resp culture with MSSA - Continue Ancef.  Cefepime switched to cefazolin and linezolid stopped due to sensitivity report (MSSA) - Foley removed  - Continue to monitor   7. Hypernatremia -- improving - Sodium 148. Receiving free water flushes of 250 mL q 4 hours  8. Constipation - No BM x6 days - Will give additional dose of sorbitol today and increase Miralax   Darcella Gasman, NP  05/08/2021, 7:52 AM  Advanced Heart  Failure Team Pager [phone] (M-F; 7a - 5p)  Please contact [facility] for night-coverage after hours (5p -7a ) and weekends on [email or web address]  Agree with above.  Remains intubated. Had increased O2 requirements overnight. CT chest shows atelactasis. No clear infiltrate. PCT 0.54  ESR 128.  Given lasix overnight. CVP 6   General:  Awake on vent  HEENT: normal + ETT Neck: supple. no JVD. Carotids 2+ bilat; no bruits. No lymphadenopathy or thryomegaly appreciated. Cor: PMI nondisplaced. Regular rate & rhythm. No rubs, gallops or murmurs. Lungs: clear deceased BS Abdomen: obese soft, nontender, nondistended. No hepatosplenomegaly. No bruits or masses. Good bowel sounds. Extremities: no cyanosis, clubbing, rash, edema Neuro: awake on vent  Remains tenuous. D/w CCM they don't feel that picture is c/w amio toxicity. Suspect ATX +/- PNA. On ancef for MSSA in sputum.  Remains in NSR. IV amio is off. Volume status ok .  P/CCM plans to extubate today and see if he can tolerate in an effort to avoid tracheostomy.   CRITICAL CARE Performed by: Glori Bickers  Total critical care time: 35 minutes  Critical care time was exclusive of separately billable procedures and treating other patients.  Critical care was necessary to treat or prevent imminent or life-threatening deterioration.  Critical care was time spent personally by me (independent of midlevel providers or residents) on the following  activities: development of treatment plan with patient and/or surrogate as well as nursing, discussions with consultants, evaluation of patient's response to treatment, examination of patient, obtaining history from patient or surrogate, ordering and performing treatments and interventions, ordering and review of laboratory studies, ordering and review of radiographic studies, pulse oximetry and re-evaluation of patient's condition.  Glori Bickers, MD  1:51 PM

## 2021-05-08 NOTE — Progress Notes (Addendum)
NAME:  Jeffrey [name], MRN:  [id number], DOB:  1975/10/24, LOS: 8 ADMISSION DATE:  04/29/2021, CONSULTATION DATE:  04/30/2021 REFERRING MD:  Marigene Ehlers CHIEF COMPLAINT: Dyspnea  History of Present Illness:  45 year old man with extensive cardiac hx as below presenting with decompensated heart failure in setting of afib.  Cardioverted earlier today but unforuntately worse respiratory distress.  + orthopnea.  Saturating 80s on NRB and RR 40s, Aox3, anxious.  Pertinent  Medical History  NICM with AICD in place, Hx Afib, Hx ventricular tachycardia, HTN, T2DM, R renal cell carcinoma s/p nephrectomy in 2014, CKD 3a, OSA on CPAP, Hx of traumatic intubation.  Significant Hospital Events: Including procedures, antibiotic start and stop dates in addition to other pertinent events   7/26 admitted. 7/27 cardioversion, worsening resp distress; intubation. 7/27 Echo with EF less than 20% (global HK) and grade 3 DD.  RV systolic function mod reduced. [id number] foley complication, new catheter placed by urology with good output. 7/29 Continued urine output and creatinine plateau; urology consult: leave foley in for 7 days 7/30 Failed SBT 16/8 PSV 7/31 weaning on 16/8 PSV, diuresing, lots of secretions, more awake, Coox 76% 8/1 attempted ABT. still not awake enough. Started precedex. Fever up to 101.2 with thick secretions. Respiratory culture with rare Staph Aureus. Started on linezolid and cefepime. Eliquis restarted. 8/2 Desaturated to 89% on FIO2 of 85%. Increased FIO2 100% and PEEP 8. 8/3 Inc O2 requirements. Dobutamine stopped. Diuresis held. Linezolid changed to Ancef. Amiodarone stopped w/ concern for amiodarone toxicity. Prednisone started at '60mg'$  VT CT chest 1. Cardiomegaly. Dependent lower lobe atelectasis. No airspace disease or effusion.3. Support devices as above. 8/4: Placed on high pressure support.  Placed in upright position.  Escalated bowel regimen.  Stop steroids  Interim History / Subjective:   Still requiring high FiO2, has not had bowel movement in several days, abdomen firm and distended  Objective   Blood pressure (Abnormal) 121/92, pulse 61, temperature 99.3 F (37.4 C), temperature source Oral, resp. rate (Abnormal) 22, height '5\' 8"'$  (1.727 m), weight (Abnormal) 140.8 kg, SpO2 95 %. CVP:  [6 mmHg-11 mmHg] 9 mmHg  Vent Mode: PRVC FiO2 (%):  [70 %-80 %] 80 % Set Rate:  [18 bmp] 18 bmp Vt Set:  [550 mL] 550 mL PEEP:  [8 cmH20-12 cmH20] 12 cmH20 Plateau Pressure:  [21 cmH20-29 cmH20] 26 cmH20   Intake/Output Summary (Last 24 hours) at 05/08/2021 0731 Last data filed at 05/08/2021 [id number] Gross per 24 hour  Intake 2404.69 ml  Output 1850 ml  Net 554.69 ml   Filed Weights   05/06/21 0431 05/07/21 0400 05/08/21 0500  Weight: (Abnormal) 137.4 kg (Abnormal) 136.9 kg (Abnormal) 140.8 kg    Examination: General: This is a very 45 year old African-American male who remains on mechanical ventilation HEENT normocephalic atraumatic no jugular venous distention Pulmonary: Remarkably diminished bases no accessory use Cardiac: Regular rate and rhythm Abdomen: Obese, distended, firm to palpation.  Hypoactive GU: External condom catheter in place clear yellow urine Extremities: Warm dry brisk cap refill Neuro: Awake, follows commands.  Resolved Hospital Problem list   Cardiogenic shock  Volume overload and pleural effusions Assessment & Plan:   Acute decompensated systolic heart failure  Plan Continue telemetry monitoring  Atrial fibrillation Plan Amiodarone stopped Continue telemetry Continue apixaban   Acute hypoxic respiratory failure secondary to dependent atelectasis and MSSA pneumonia. Body habitus, as well as decreased abdominal compliance from constipation also large contributing factor.  Do not think this is amiodarone  toxicity.  His hypoxia is explained by shunt physiology from atelectasis Plan Continuing mechanical ventilation, he actually looks comfortable on  high pressure support Maximize upright position Day #2 Ancef Minimize sedation Escalate bowel regimen Diuresis as able Discontinue prednisone  AKI on CKD Soliltary kidney s/p nephrectomy in 2014.  Lasix again last night and creatinine bumped Plan: Continue to trend intake output Hold off on further diuresis today Renal dose medications  Morbid obesity  Plan Outpatient follow-up, needs weight loss this is contributing to his acute illness currently  And electrolyte imbalance: Hypernatremia hyperchloremia Sodium trending down to 148 from 151 Plan Hold further diuresis Continue free water replacement  DM2 with hyperglycemia Likely further exacerbated by steroids Plan Discontinue prednisone Continue Levemir 50 units twice daily, increase aspart to 10 units every 4 and continue sliding scale as resistant.  Hopefully once steroids out of system is glycemic control will improve  Constipation Plan Bowel reg started.   Best Practice (right click and "Reselect all SmartList Selections" daily)   Diet/type: tubefeeds DVT prophylaxis: DOAC GI prophylaxis: PPI Lines: Central line and yes and it is still needed Foley:  N/A Code Status:  full code Last date of multidisciplinary goals of care discussion [per primary]   My cct 32 min   Erick Colace ACNP-BC [place] Pager # [phone] OR # [phone] if no answer

## 2021-05-08 NOTE — Progress Notes (Signed)
Pt placed on PSV 11/10 and FIO2 decreased to 40% by MD. Pt is tolerating well at this time. RT to continue to monitor.

## 2021-05-08 NOTE — Progress Notes (Signed)
Pts FIO2 increased to 60% due to sats dropping to 85-87%. Sats increased to 94%. MD made aware and requested pt to kept on PSV. RT to continue to monitor.

## 2021-05-08 NOTE — Progress Notes (Signed)
eLink Physician-Brief Progress Note Patient Name: Jeffrey [name] DOB: 1976-06-30 MRN: [id number]   Date of Service  05/08/2021  HPI/Events of Note  Nursing remains concerned about Sat = 90% in spite of increasing PEEP to 10 and Lasix. He has diuresed about 750 mL with the Lasix. Reading the daily progress notes the PCCM rounding team is puzzled by his hypoxia. His COOX = 70.8% this morning. He is on Eliquis for AFIB which makes PE as the etiology of his hypoxia unlikely. His Na+ = 151, therefore, I am reluctant to continue to diurese the patient. In addition, his heart failure may limit the ability to increase his PEEP.  eICU Interventions  Plan: Increase PEEP to 12.     Intervention Category Major Interventions: Hypoxemia - evaluation and management  Brandolyn Shortridge Eugene 05/08/2021, 3:00 AM

## 2021-05-09 DIAGNOSIS — I4891 Unspecified atrial fibrillation: Secondary | ICD-10-CM | POA: Diagnosis not present

## 2021-05-09 DIAGNOSIS — I5023 Acute on chronic systolic (congestive) heart failure: Secondary | ICD-10-CM | POA: Diagnosis not present

## 2021-05-09 DIAGNOSIS — I480 Paroxysmal atrial fibrillation: Secondary | ICD-10-CM | POA: Diagnosis not present

## 2021-05-09 DIAGNOSIS — I472 Ventricular tachycardia: Secondary | ICD-10-CM | POA: Diagnosis not present

## 2021-05-09 DIAGNOSIS — R570 Cardiogenic shock: Secondary | ICD-10-CM | POA: Diagnosis not present

## 2021-05-09 DIAGNOSIS — J9601 Acute respiratory failure with hypoxia: Secondary | ICD-10-CM | POA: Diagnosis not present

## 2021-05-09 DIAGNOSIS — N170 Acute kidney failure with tubular necrosis: Secondary | ICD-10-CM | POA: Diagnosis not present

## 2021-05-09 DIAGNOSIS — N1831 Chronic kidney disease, stage 3a: Secondary | ICD-10-CM | POA: Diagnosis not present

## 2021-05-09 LAB — GLUCOSE, CAPILLARY
Glucose-Capillary: 133 mg/dL — ABNORMAL HIGH (ref 70–99)
Glucose-Capillary: 189 mg/dL — ABNORMAL HIGH (ref 70–99)
Glucose-Capillary: 195 mg/dL — ABNORMAL HIGH (ref 70–99)
Glucose-Capillary: 204 mg/dL — ABNORMAL HIGH (ref 70–99)
Glucose-Capillary: 255 mg/dL — ABNORMAL HIGH (ref 70–99)
Glucose-Capillary: 258 mg/dL — ABNORMAL HIGH (ref 70–99)

## 2021-05-09 LAB — BASIC METABOLIC PANEL
Anion gap: 8 (ref 5–15)
BUN: 78 mg/dL — ABNORMAL HIGH (ref 6–20)
CO2: 26 mmol/L (ref 22–32)
Calcium: 10.8 mg/dL — ABNORMAL HIGH (ref 8.9–10.3)
Chloride: 112 mmol/L — ABNORMAL HIGH (ref 98–111)
Creatinine, Ser: 1.89 mg/dL — ABNORMAL HIGH (ref 0.61–1.24)
GFR, Estimated: 44 mL/min — ABNORMAL LOW (ref >60)
Glucose, Bld: 121 mg/dL — ABNORMAL HIGH (ref 70–99)
Potassium: 3.5 mmol/L (ref 3.5–5.1)
Sodium: 146 mmol/L — ABNORMAL HIGH (ref 135–145)

## 2021-05-09 LAB — COOXEMETRY PANEL
Carboxyhemoglobin: 0.8 % (ref 0.5–1.5)
Methemoglobin: 0.9 % (ref 0.0–1.5)
O2 Saturation: 72.2 %
Total hemoglobin: 13.6 g/dL (ref 12.0–16.0)

## 2021-05-09 LAB — CBC
HCT: 42.7 % (ref 39.0–52.0)
Hemoglobin: 13.4 g/dL (ref 13.0–17.0)
MCH: 27.3 pg (ref 26.0–34.0)
MCHC: 31.4 g/dL (ref 30.0–36.0)
MCV: 87 fL (ref 80.0–100.0)
Platelets: 353 10*3/uL (ref 150–400)
RBC: 4.91 MIL/uL (ref 4.22–5.81)
RDW: 17.5 % — ABNORMAL HIGH (ref 11.5–15.5)
WBC: 8.5 10*3/uL (ref 4.0–10.5)
nRBC: 0.2 % (ref 0.0–0.2)

## 2021-05-09 MED ORDER — AMIODARONE LOAD VIA INFUSION
150.0000 mg | Freq: Once | INTRAVENOUS | Status: AC
  Administered 2021-05-09: 150 mg via INTRAVENOUS
  Filled 2021-05-09: qty 83.34

## 2021-05-09 MED ORDER — DOCUSATE SODIUM 100 MG PO CAPS
100.0000 mg | ORAL_CAPSULE | Freq: Two times a day (BID) | ORAL | Status: DC
  Administered 2021-05-10 – 2021-05-22 (×8): 100 mg via ORAL
  Filled 2021-05-09 (×20): qty 1

## 2021-05-09 MED ORDER — ENSURE ENLIVE PO LIQD
237.0000 mL | Freq: Two times a day (BID) | ORAL | Status: DC
  Administered 2021-05-11: 237 mL via ORAL

## 2021-05-09 MED ORDER — TORSEMIDE 20 MG PO TABS
40.0000 mg | ORAL_TABLET | Freq: Every day | ORAL | Status: DC
  Administered 2021-05-09 – 2021-05-13 (×5): 40 mg via ORAL
  Filled 2021-05-09 (×5): qty 2

## 2021-05-09 MED ORDER — PANTOPRAZOLE SODIUM 40 MG PO TBEC
40.0000 mg | DELAYED_RELEASE_TABLET | Freq: Every day | ORAL | Status: DC
  Administered 2021-05-09 – 2021-05-22 (×14): 40 mg via ORAL
  Filled 2021-05-09 (×13): qty 1

## 2021-05-09 MED ORDER — TRAZODONE HCL 50 MG PO TABS
100.0000 mg | ORAL_TABLET | Freq: Every day | ORAL | Status: DC
  Administered 2021-05-09 – 2021-05-21 (×13): 100 mg via ORAL
  Filled 2021-05-09 (×13): qty 2

## 2021-05-09 MED ORDER — PROSOURCE TF PO LIQD
90.0000 mL | Freq: Two times a day (BID) | ORAL | Status: DC
  Administered 2021-05-09 – 2021-05-11 (×4): 90 mL
  Filled 2021-05-09 (×5): qty 90

## 2021-05-09 MED ORDER — ACETAMINOPHEN 325 MG PO TABS
650.0000 mg | ORAL_TABLET | Freq: Four times a day (QID) | ORAL | Status: DC | PRN
  Administered 2021-05-09: 650 mg via ORAL
  Filled 2021-05-09 (×2): qty 2

## 2021-05-09 MED ORDER — APIXABAN 5 MG PO TABS
5.0000 mg | ORAL_TABLET | Freq: Two times a day (BID) | ORAL | Status: DC
  Administered 2021-05-09 – 2021-05-22 (×26): 5 mg via ORAL
  Filled 2021-05-09 (×26): qty 1

## 2021-05-09 MED ORDER — ALPRAZOLAM 0.5 MG PO TABS
0.5000 mg | ORAL_TABLET | Freq: Two times a day (BID) | ORAL | Status: DC
  Administered 2021-05-09 – 2021-05-22 (×27): 0.5 mg via ORAL
  Filled 2021-05-09 (×27): qty 1

## 2021-05-09 MED ORDER — SPIRONOLACTONE 12.5 MG HALF TABLET
12.5000 mg | ORAL_TABLET | Freq: Every day | ORAL | Status: DC
  Administered 2021-05-09 – 2021-05-12 (×4): 12.5 mg via ORAL
  Filled 2021-05-09 (×4): qty 1

## 2021-05-09 MED ORDER — POLYETHYLENE GLYCOL 3350 17 G PO PACK: 17.0000 g | PACK | Freq: Two times a day (BID) | ORAL | Status: DC

## 2021-05-09 MED ORDER — SACUBITRIL-VALSARTAN 24-26 MG PO TABS
1.0000 | ORAL_TABLET | Freq: Two times a day (BID) | ORAL | Status: DC
  Administered 2021-05-09 – 2021-05-19 (×22): 1 via ORAL
  Filled 2021-05-09 (×23): qty 1

## 2021-05-09 MED ORDER — POTASSIUM CHLORIDE CRYS ER 20 MEQ PO TBCR
40.0000 meq | EXTENDED_RELEASE_TABLET | Freq: Once | ORAL | Status: AC
  Administered 2021-05-09: 40 meq via ORAL
  Filled 2021-05-09: qty 2

## 2021-05-09 MED ORDER — AMIODARONE HCL IN DEXTROSE 360-4.14 MG/200ML-% IV SOLN
60.0000 mg/h | INTRAVENOUS | Status: DC
  Administered 2021-05-09 (×2): 60 mg/h via INTRAVENOUS
  Filled 2021-05-09 (×2): qty 200

## 2021-05-09 MED ORDER — VITAL AF 1.2 CAL PO LIQD
1000.0000 mL | ORAL | Status: DC
  Administered 2021-05-09 – 2021-05-10 (×2): 1000 mL

## 2021-05-09 MED ORDER — AMIODARONE HCL IN DEXTROSE 360-4.14 MG/200ML-% IV SOLN
60.0000 mg/h | INTRAVENOUS | Status: DC
  Administered 2021-05-09: 30 mg/h via INTRAVENOUS
  Administered 2021-05-10 (×3): 60 mg/h via INTRAVENOUS
  Administered 2021-05-10 – 2021-05-12 (×2): 30 mg/h via INTRAVENOUS
  Administered 2021-05-13: 30.06 mg/h via INTRAVENOUS
  Filled 2021-05-09 (×10): qty 200

## 2021-05-09 MED ORDER — ISOSORB DINITRATE-HYDRALAZINE 20-37.5 MG PO TABS
1.0000 | ORAL_TABLET | Freq: Three times a day (TID) | ORAL | Status: DC
  Administered 2021-05-09 – 2021-05-15 (×18): 1 via ORAL
  Filled 2021-05-09 (×20): qty 1

## 2021-05-09 NOTE — Progress Notes (Addendum)
NAME:  Jeffrey [name], MRN:  [id number], DOB:  01/25/1976, LOS: 9 ADMISSION DATE:  04/29/2021, CONSULTATION DATE:  04/30/2021 REFERRING MD:  Marigene Ehlers CHIEF COMPLAINT: Dyspnea  History of Present Illness:  45 year old man with extensive cardiac hx as below presenting with decompensated heart failure in setting of afib.  Cardioverted earlier today but unforuntately worse respiratory distress.  + orthopnea.  Saturating 80s on NRB and RR 40s, Aox3, anxious.  Pertinent  Medical History  NICM with AICD in place, Hx Afib, Hx ventricular tachycardia, HTN, T2DM, R renal cell carcinoma s/p nephrectomy in 2014, CKD 3a, OSA on CPAP, Hx of traumatic intubation.  Significant Hospital Events: Including procedures, antibiotic start and stop dates in addition to other pertinent events   7/26 admitted. 7/27 cardioversion, worsening resp distress; intubation. 7/27 Echo with EF less than 20% (global HK) and grade 3 DD.  RV systolic function mod reduced. [id number] foley complication, new catheter placed by urology with good output. 7/29 Continued urine output and creatinine plateau; urology consult: leave foley in for 7 days 7/30 Failed SBT 16/8 PSV 7/31 weaning on 16/8 PSV, diuresing, lots of secretions, more awake, Coox 76% 8/1 attempted ABT. still not awake enough. Started precedex. Fever up to 101.2 with thick secretions. Respiratory culture with rare Staph Aureus. Started on linezolid and cefepime. Eliquis restarted. 8/2 Desaturated to 89% on FIO2 of 85%. Increased FIO2 100% and PEEP 8. 8/3 Inc O2 requirements. Dobutamine stopped. Diuresis held. Linezolid changed to Ancef. Amiodarone stopped w/ concern for amiodarone toxicity. Prednisone started at '60mg'$  VT CT chest 1. Cardiomegaly. Dependent lower lobe atelectasis. No airspace disease or effusion.3. Support devices as above. 8/4: Extubated to BIPAP. Tolerated without complications. On 15L HFNC. Escalated bowel regimen and had five BMs.  Stop steroids.  8/5 HFNC SpO2  >95% on 11L HFNC.  Interim History / Subjective:  S/p extubation, tolerating BiPAP and currently SpO2 >95% on 11L HFNC, had five bowel movements yesterday. Had fever yesterday morning of 100.5, currently on cefazolin for MSSA pneumonia/tracheobronchitis.  Objective   Blood pressure 134/79, pulse 71, temperature 98.7 F (37.1 C), temperature source Axillary, resp. rate (!) 26, height '5\' 8"'$  (1.727 m), weight (!) 138.3 kg, SpO2 97 %. CVP:  [6 mmHg-9 mmHg] 9 mmHg  Vent Mode: BIPAP;PSV FiO2 (%):  [50 %-100 %] 50 % Set Rate:  [12 bmp] 12 bmp PEEP:  [6 cmH20-10 cmH20] 6 cmH20 Pressure Support:  [7 cmH20-11 cmH20] 7 cmH20   Intake/Output Summary (Last 24 hours) at 05/09/2021 0910 Last data filed at 05/09/2021 0858 Gross per 24 hour  Intake 7831.6 ml  Output 1875 ml  Net 5956.6 ml    Filed Weights   05/07/21 0400 05/08/21 0500 05/09/21 0338  Weight: (!) 136.9 kg (!) 140.8 kg (!) 138.3 kg    Examination: General: Lying in bed. More attentive this morning now off sedation.  HEENT: NCAT, no nasal drainage.  Pulmonary: CTAB, no wheezes or crackles. On 11L HFNC. Cardiac: Regular rate with irregular rhythm, no murmur or gallops.  Abdomen: Obese, distended, firm to palpation.  Hypoactive GU: External condom catheter in place clear yellow urine. Extremities: Swelling of right knee compared to left but without erythema or increased warmth. Cap refill <2 seconds.  Neuro: Awake, follows commands. More conversational now s/p extubation.   Resolved Hospital Problem list   Cardiogenic shock  Volume overload and pleural effusions Assessment & Plan:  Acute decompensated systolic heart failure  Management per primary team Holding diuretics with hypernatremia.  Continue telemetry monitoring  Atrial fibrillation Amiodarone stopped Continue telemetry Continue apixaban   Acute hypoxic respiratory failure secondary to dependent atelectasis and MSSA pneumonia. Hypoxia likely due to shunting from  atelectasis vs pneumonia.  Now s/p extubation on HFNC. -Continue on HFNC currently on 11L. -BiPAP as tolerated. -Day #3 Ancef complete 7 days of abx, last dose on 05/11/2021. -Minimize sedation -Escalate bowel regimen -Diuresis as able   AKI on CKD Soliltary kidney s/p nephrectomy in 2014.   Continue to trend intake output Renal dose medications   Electrolyte imbalance Hypernatremia hyperchloremia hypokalemia Hold further diuresis Continue free water replacement Replete prn  DM2 with hyperglycemia Continue Levemir 50 units twice daily, aspart 10 units every 4 hours and continue SSI.    Constipation -Continue bowel regimen -Enema as needed.   Morbid obesity  Outpatient follow-up, needs weight loss this is contributing to his acute illness currently  Disposition Given patient's improvement in respiratory status, patient will be transferred to progressive unit.  Best Practice (right click and "Reselect all SmartList Selections" daily)   Diet/type: thin fluids, NPO at midnight.  DVT prophylaxis: DOAC GI prophylaxis: PPI Lines: Central line and yes and it is still needed Foley:  N/A Code Status:  full code Last date of multidisciplinary goals of care discussion [per primary]    PCCM Attending:   45 year old gentleman, extensive cardiac disease, presented with decompensated heart failure atrial fibrillation.  Was in cardiogenic shock volume overloaded hypotensive intubated on mechanical life support.  Now extubated with past 24 hours doing well.  Weaning nasal cannula O2.  Patient diet slowly advancing.  Still has core track in place.  Discussed with dietitian for maintenance of nocturnal feedings until he can get his p.o. intake up.  BP (!) 87/72   Pulse 83   Temp 98.1 F (36.7 C) (Oral)   Resp (!) 23   Ht '5\' 8"'$  (1.727 m)   Wt (!) 138.3 kg   SpO2 100%   BMI 46.36 kg/m   General: Obese male resting comfortably in bed, family at bedside. HEENT: NCAT Heart:  Regular rhythm S1-S2 Lungs: Diminished breath sounds in the bases no crackles no wheeze Abdomen: Obese Extremities: Trace edema  Labs: Reviewed  Assessment: Acute systolic heart failure, decompensated Cardiogenic shock, resolved Acute hypoxemic respiratory failure, now extubated, MSSA pneumonia on antibiotics AKI on CKD Type 2 diabetes Morbid obesity  Plan:  Continue telemetry Diuresis per heart failure service, holding at this time  Complete 7 days total of antibiotics, stop date written I think its okay to continue amiodarone. CT imaging not concerning for amio lung   PCCM will sign off at this time.  Please call with any questions.   Garner Nash, DO [facility] 05/09/2021 2:23 PM

## 2021-05-09 NOTE — Evaluation (Signed)
Physical Therapy Evaluation Patient Details Name: DEWAINE HOEPPNER MRN: [id number] DOB: 28-Dec-1975 Today's Date: 05/09/2021   History of Present Illness  45yo admitted with nauesa and weight gain 7/26. Pt with Afib with RVR leading to CHF exacerbation, cardiogenic shock and respiratory failure. 7/27 cardioversion with intubation, extubated 8/4. PMhx: poorly controlled HTN, CKD stage 3a, PAF, DM2, morbid obesity, chronic systolic CHF, NICM s/p S-ICD, right renal cell CA s/p nephrectomy.  Clinical Impression  Pt pleasant and wanting to get OOB to chair. Pt with HR 125 at rest in semichair position with increase to 150 with seated HEP. Pt able to tolerate 2 min in full sitting prior to HR 170 with return to supine and session ceased. Pt with decreased strength, function, transfers and cardiopulmonary function. Pt educated for bil UE and LE HEP as noted weakness throughout as well as activity progression and D/C plan. Wife works during school year and pt unsure of assist when she is at work.   9L with SpO2 100%    Follow Up Recommendations LTACH;Supervision/Assistance - 24 hour    Equipment Recommendations  Other (comment) (TBD with progression)    Recommendations for Other Services OT consult     Precautions / Restrictions Precautions Precautions: Fall Precaution Comments: cortrak, watch sats and HR      Mobility  Bed Mobility Overal bed mobility: Needs Assistance Bed Mobility: Rolling Rolling: Min assist         General bed mobility comments: min assist to roll to right. Max +2 to slide toward [facility] in trendelenburg    Transfers Overall transfer level: Needs assistance               General transfer comment: transition from supine to sit via foot egress positioning of bed. min assist to scoot to edge of surface with min assist and bil rails for pt to pull trunk forward into unsupported sitting. pt sat 2 min prior to HR up to 170 with return to supine  Ambulation/Gait              General Gait Details: unable  Stairs            Wheelchair Mobility    Modified Rankin (Stroke Patients Only)       Balance Overall balance assessment: Needs assistance   Sitting balance-Leahy Scale: Poor Sitting balance - Comments: in unsupported sitting required bil UE support   Standing balance support: Bilateral upper extremity supported                                 Pertinent Vitals/Pain Pain Assessment: 0-10 Pain Score: 5  Pain Location: right knee Pain Descriptors / Indicators: Aching;Guarding Pain Intervention(s): Limited activity within patient's tolerance;Monitored during session;Repositioned    Home Living Family/patient expects to be discharged to:: Private residence Living Arrangements: Spouse/significant other;Children Available Help at Discharge: Family;Available 24 hours/day (Wife is a Education officer, museum, so when school starts may not have anyone available) Type of Home: House Home Access: Stairs to enter;Ramped entrance Entrance Stairs-Rails: Right Entrance Stairs-Number of Steps: 5; ramp in back Home Layout: One level Home Equipment: None      Prior Function Level of Independence: Independent         Comments: Likes to hang out at home     Hand Dominance   Dominant Hand: Right    Extremity/Trunk Assessment   Upper Extremity Assessment Upper Extremity Assessment: RUE deficits/detail;LUE deficits/detail RUE Deficits /  Details: grossly 3/5 shoulder flexion and elbow flexion LUE Deficits / Details: grossly 3/5 shoulder flexion and elbow flexion    Lower Extremity Assessment Lower Extremity Assessment: RLE deficits/detail;LLE deficits/detail RLE Deficits / Details: only able to bend knee 45 degrees due to edema and pain, 2/5 knee extension and flexion LLE Deficits / Details: grossly 60 degrees knee flexion with 3/5 knee extension and 2/5 knee flexion    Cervical / Trunk Assessment Cervical / Trunk Assessment:  Other exceptions Cervical / Trunk Exceptions: rounded shoulders, increased body habitus  Communication   Communication: No difficulties  Cognition Arousal/Alertness: Awake/alert Behavior During Therapy: WFL for tasks assessed/performed Overall Cognitive Status: Within Functional Limits for tasks assessed                                        General Comments      Exercises General Exercises - Upper Extremity Shoulder Flexion: AROM;Both;Seated;10 reps General Exercises - Lower Extremity Short Arc Quad: AROM;Both;Seated;10 reps Heel Slides: AROM;AAROM;Right;Left;Seated;10 reps (AAROM on RLE)   Assessment/Plan    PT Assessment Patient needs continued PT services  PT Problem List Decreased strength;Decreased mobility;Decreased activity tolerance;Decreased balance;Decreased knowledge of use of DME;Cardiopulmonary status limiting activity;Obesity;Decreased range of motion       PT Treatment Interventions DME instruction;Therapeutic exercise;Functional mobility training;Therapeutic activities;Patient/family education;Cognitive remediation;Neuromuscular re-education;Gait training;Balance training    PT Goals (Current goals can be found in the Care Plan section)  Acute Rehab PT Goals Patient Stated Goal: return home to my kids (pt has 14 kids but a 75yo and 36 yo that live at home) PT Goal Formulation: With patient Time For Goal Achievement: 05/23/21 Potential to Achieve Goals: Fair    Frequency Min 3X/week   Barriers to discharge Decreased caregiver support      Co-evaluation               AM-PAC PT "6 Clicks" Mobility  Outcome Measure Help needed turning from your back to your side while in a flat bed without using bedrails?: A Little Help needed moving from lying on your back to sitting on the side of a flat bed without using bedrails?: Total Help needed moving to and from a bed to a chair (including a wheelchair)?: Total Help needed standing up from a  chair using your arms (e.g., wheelchair or bedside chair)?: Total Help needed to walk in hospital room?: Total Help needed climbing 3-5 steps with a railing? : Total 6 Click Score: 8    End of Session Equipment Utilized During Treatment: Oxygen Activity Tolerance: Patient limited by fatigue Patient left: in bed;with call [place]/phone within reach;with bed alarm set Nurse Communication: Mobility status;Precautions PT Visit Diagnosis: Other abnormalities of gait and mobility (R26.89);Difficulty in walking, not elsewhere classified (R26.2);Muscle weakness (generalized) (M62.81)    Time: [id number] PT Time Calculation (min) (ACUTE ONLY): 28 min   Charges:   PT Evaluation $PT Eval High Complexity: 1 High          Mykael Batz P, PT Acute Rehabilitation Services Pager: [phone] Office: [phone]   Sandy Salaam Mona Ayars 05/09/2021, 12:51 PM

## 2021-05-09 NOTE — Plan of Care (Signed)
  Problem: Clinical Measurements: Goal: Respiratory complications will improve Outcome: Progressing   Problem: Activity: Goal: Risk for activity intolerance will decrease Outcome: Progressing   Problem: Nutrition: Goal: Adequate nutrition will be maintained Outcome: Progressing Note: Tube feeds switched from continuous to nocturnal     Problem: Coping: Goal: Level of anxiety will decrease Outcome: Progressing   Problem: Elimination: Goal: Will not experience complications related to urinary retention Outcome: Progressing   Problem: Clinical Measurements: Goal: Cardiovascular complication will be avoided Outcome: Not Progressing Note: Pt back in afib with rates in 140s-150s

## 2021-05-09 NOTE — Progress Notes (Signed)
RT note. Patient taken off bipap at this time and placed on 15L salter sat 93%, RN aware. Hickman in patients room if patient requires more flow. RT will continue to monitor

## 2021-05-09 NOTE — Progress Notes (Signed)
VAD coordinator spoke with pt about LVAD therapy. Pt is not ready to consent to evaluation just yet. He would like to discuss everything with his wife this evening. Pt tells me that he was not working prior to this hospitalization due to his heart failure. He states that he lives at home with his wife who is a Radio producer. I extended the offer for a VAD pt to come talk with him over the weekend but he denied the invitation. VAD educational packet including "Understanding Your Options with Advanced Heart Failure", "[place] Patient Agreement for VAD Evaluation and Potential Implantation" consent, and [employer] "Living a More Active Life" HM III booklet", "[place] HM III Patient Education", "[place] Mechanical Circulatory Support Program", and "Decision Aids for Left Ventricular Assist Device" reviewed in detail and left at bedside for continued reference.                                               The patient understands that from this discussion it does not mean that they will receive the device, but that depends on an extensive evaluation process. We will continue to follow the pt.  Tanda Rockers, RN VAD Coordinator   Office: [phone] 24/7 VAD Pager: [phone]   Tanda Rockers RN, BSN VAD Coordinator 24/7 Pager [phone]

## 2021-05-09 NOTE — Progress Notes (Signed)
Nutrition Follow-up  DOCUMENTATION CODES:   Morbid obesity  INTERVENTION:  Provide Ensure Enlive po BID, each supplement provides 350 kcal and 20 grams of protein  Transition to nocturnal tube feeds via Cortrak: Vital AF 1.2 formula at rate of 85 ml/hr x 12 hours (7pm-7am)  Provide 90 ml Prosource TF BID per tube.  Free water flushes of 250 ml q 4 hours per tube.  Tube feeding regimen provides 1384 kcal (63% kcal needs), 121 grams of protein (93% of protein needs), 2326 ml free water.   NUTRITION DIAGNOSIS:   Inadequate oral intake related to acute illness as evidenced by NPO status; diet advanced; progressing  GOAL:   Patient will meet greater than or equal to 90% of their needs; to be met with TF  MONITOR:   PO intake, Supplement acceptance, Diet advancement, TF tolerance, Skin, Weight trends, Labs, I & O's  REASON FOR ASSESSMENT:   Consult, Ventilator Enteral/tube feeding initiation and management  ASSESSMENT:   45 yo male admitted with decompensated heart failure in setting of afib, worsening respiratory failure requiring intubation. PMH includes CHF, CKD III, solitary kidney secondary to R renal cell carcinoma   7/26 - Admitted 7/27 - Cardioversion, worsening resp status requiring intubation 8/3 - Cortrak placed 8/4 - Extubated  Pt is currently on HFNC 11L. Diet has been advanced to a soft diet with thin liquids. Per RN, pt not taking in much PO. Pt with feelings of fullness. Pt continues on continuous tube feeds via Cortrak NGT. RD to modify tube feeding orders and transition to nocturnal feeds to encouraged PO intake during the day while continuing to provide adequate nutrition. RD to additionally order nutritional supplements to aid in PO.   Labs and medications reviewed.   Diet Order:   Diet Order             DIET SOFT Room service appropriate? Yes; Fluid consistency: Thin  Diet effective now                   EDUCATION NEEDS:   Not appropriate  for education at this time  Skin:  Skin Assessment: Reviewed RN Assessment  Last BM:  8/4  Height:   Ht Readings from Last 1 Encounters:  05/07/21 _0  (1.727 m)    Weight:   Wt Readings from Last 1 Encounters:  05/09/21 (!) 138.3 kg   BMI:  Body mass index is 46.36 kg/m.  Estimated Nutritional Needs:   Kcal:  2200-2400  Protein:  130-150 grams  Fluid:  >/= 2 L/day  Corrin Parker, MS, RD, LDN RD pager number/after hours weekend pager number on Amion.

## 2021-05-09 NOTE — Progress Notes (Signed)
Patient ID: Jeffrey [name], male   DOB: [birth year], 45 y.o.   MRN: [id number]     Advanced Heart Failure Rounding Note  PCP-Cardiologist: Glori Bickers, MD   Subjective:   04/30/21 Echo: EF < 20%, severe LV dilation, moderate RV dysfunction 05/05/21 Bidil increased to 1 tab tid. Started on eliquis. Febrile to 101.2. Started on zyvox+ cefepime   05/05/21 Sputum Cx: rare staph aureus    8/3: Linezolid and cefepime switched to Ancef   Extubated on 8/4, initially to BiPAP and now weaned down to HFNC at 11L.    CVP of 9, co-ox of 72.  Net I/O of +700 with 1400 of UOP x24 hours. Weight down 6 pounds from yesterday (310 => 304). Creatinine down slightly to 1.89 from 2.  BM x5 after enema yesterday and now eating well.   He denies chest pain and reports feeling better overall.  No significant shortness of breath and says he feels less abdominal distention after multiple bowel movements.   Objective:   Weight Range: (!) 138.3 kg Body mass index is 46.36 kg/m.   Vital Signs:   Temp:  [98.6 F (37 C)-100.5 F (38.1 C)] 98.7 F (37.1 C) (08/05 0739) Pulse Rate:  [37-96] 83 (08/05 0800) Resp:  [9-28] 23 (08/05 0800) BP: (87-158)/(55-96) 134/79 (08/05 0800) SpO2:  [90 %-100 %] 98 % (08/05 0800) FiO2 (%):  [40 %-100 %] 50 % (08/05 0308) Weight:  [138.3 kg] 138.3 kg (08/05 0338) Last BM Date: 05/08/21  Weight change: Filed Weights   05/07/21 0400 05/08/21 0500 05/09/21 0338  Weight: (!) 136.9 kg (!) 140.8 kg (!) 138.3 kg    Intake/Output:   Intake/Output Summary (Last 24 hours) at 05/09/2021 0805 Last data filed at 05/09/2021 0800 Gross per 24 hour  Intake 7371.33 ml  Output 1625 ml  Net 5746.33 ml       Physical Exam   CVP 9 General:  Alert and oriented.  No acute distress  HEENT: DHT in place.  Normocephalic  Neck: supple. JVP difficult to assess.  Carotids 2+ bilat; no bruits. No lymphadenopathy or thryomegaly appreciated.  Cor: PMI nondisplaced. Regular rate with irregular  rhythm. No rubs or gallops. Lungs: Clear  Abdomen: obese, soft, nontender, mildly distended. No hepatosplenomegaly. No bruits or masses. + BS.  TF infusing  Extremities: no cyanosis, clubbing, rash, edema Neuro: MAE and follows commands.  Telemetry   NSR 60-80s with PACs/PVCs (personally reviewed)  Labs    CBC Recent Labs    05/08/21 0416 05/09/21 0329  WBC 9.3 8.5  HGB 13.2 13.4  HCT 43.6 42.7  MCV 88.3 87.0  PLT 325 0000000    Basic Metabolic Panel Recent Labs    05/07/21 0346 05/08/21 0416 05/08/21 1715 05/08/21 2126 05/09/21 0329  NA 151*   < > 152*  --  146*  K 3.7   < > 3.2*  --  3.5  CL 114*   < > 120*  --  112*  CO2 29   < > 24  --  26  GLUCOSE 281*   < > 148*  --  121*  BUN 66*   < > 86*  --  78*  CREATININE 2.05*   < > 2.23*  --  1.89*  CALCIUM 10.4*   < > 10.8*  --  10.8*  MG 2.4  --   --  2.5*  --    < > = values in this interval not displayed.    Liver Function  Tests No results for input(s): AST, ALT, ALKPHOS, BILITOT, PROT, ALBUMIN in the last 72 hours.  No results for input(s): LIPASE, AMYLASE in the last 72 hours. Cardiac Enzymes No results for input(s): CKTOTAL, CKMB, CKMBINDEX, TROPONINI in the last 72 hours.  BNP: BNP (last 3 results) Recent Labs    11/21/20 2222 04/29/21 1247  BNP 65.0 1,407.7*     ProBNP (last 3 results) No results for input(s): PROBNP in the last 8760 hours.   D-Dimer No results for input(s): DDIMER in the last 72 hours. Hemoglobin A1C No results for input(s): HGBA1C in the last 72 hours.  Fasting Lipid Panel No results for input(s): CHOL, HDL, LDLCALC, TRIG, CHOLHDL, LDLDIRECT in the last 72 hours.  Thyroid Function Tests No results for input(s): TSH, T4TOTAL, T3FREE, THYROIDAB in the last 72 hours.  Invalid input(s): FREET3   Other results:   Imaging    No results found.   Medications:     Scheduled Medications:  ALPRAZolam  0.5 mg Oral BID   apixaban  5 mg Per Tube BID   Chlorhexidine  Gluconate Cloth  6 each Topical Daily   docusate  100 mg Per Tube BID   feeding supplement (PROSource TF)  45 mL Per Tube TID   free water  250 mL Per Tube Q4H   insulin aspart  0-20 Units Subcutaneous Q4H   insulin aspart  10 Units Subcutaneous Q4H   insulin detemir  50 Units Subcutaneous BID   isosorbide-hydrALAZINE  1 tablet Per Tube TID   mouth rinse  15 mL Mouth Rinse BID   methylnaltrexone  12 mg Subcutaneous Once   pantoprazole sodium  40 mg Per Tube Daily   polyethylene glycol  17 g Per Tube BID   sodium chloride flush  10-40 mL Intracatheter Q12H   sodium chloride flush  3 mL Intravenous Q12H   sorbitol  45 mL Oral Once    Infusions:  sodium chloride Stopped (04/30/21 2254)    ceFAZolin (ANCEF) IV 2 g (05/09/21 0559)   dexmedetomidine (PRECEDEX) IV infusion Stopped (05/08/21 1157)   feeding supplement (VITAL AF 1.2 CAL) 65 mL/hr at 05/08/21 1900   fentaNYL infusion INTRAVENOUS Stopped (05/08/21 1157)    PRN Medications: sodium chloride, acetaminophen (TYLENOL) oral liquid 160 mg/5 mL, fentaNYL (SUBLIMAZE) injection, fentaNYL (SUBLIMAZE) injection, nitroGLYCERIN, ondansetron (ZOFRAN) IV, sodium chloride flush, sodium chloride flush, sorbitol  Assessment/Plan   1. Atrial fibrillation: Paroxysmal - Suspect AF with RVR drove CHF exacerbation.  Patient has been on amiodarone and Eliquis at home, has missed some Eliquis doses. Emergent TEE-guided DCCV 7/27 due to inability to control HR - Maintaining NSR.  - Amiodarone stopped due to concern for toxicity  - Continue Eliquis.  - Eventually needs consideration for AF ablation. - Keep K of at least 4 and Mg of at least 2   2. 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  Echo in 9/21 with EF 20-25%.  Echo this admission with EF < 20%, severe LV dilation, moderate RV dysfunction.   7/27: intubated with pulmonary edema and required addition of NE. Off NE - CO-OX 63.3%, CVP  of 8. Dobutamine weaned off on 8/3  -  Continue BiDil  1 TID. Consider resuming Delene Loll, Jardiance and spironolactone closer to discharge pending clinical course  - Beta blocker held in the setting of shock - resume soon if tolerated   3. Acute hypoxemic respiratory failure -- improving - Resp culture + for rare  S. Aureus -- noted to be MSSA  - Failed vent wean and ultimately required increasing vent settings on 8/2-8/4.  Became febrile on 8/1 and started on linezolid and cefepime. Cefepime switched to Ancef and Linezolid discontinued on 8/3  - Initial concern for amiodarone toxicity -- amio stopped and steroids started, but steroids stopped the next morning on 8/4 -- per CCM team, more likely atelectasis driving hypoxia.  Deferred management to them  - Extubated on 8/4, initially to BiPAP and now weaned to 11L via HFNC  - Incentive spirometry ordered  - Continue Ancef for an anticipated total of 7 days (started 8/3)  - Tube feeds currently infusing, but patient now eating and drinking with no complications.  Will defer to CCM for discontinuation  4. AKI on CKD stage 3  - Has solitary kidney.   - Suspect cardiorenal syndrome/low output with AF/RVR in setting of cardiomyopathy.  Creatinine peaked at 4.65, now 1.89 - Baseline creatinine is 1.5-1.7  - Urinary obstruction likely played a role. Urology placed foley catheter with improvement in UOP, but now has condom catheter in place. Foley removed due to concern for infection after fever developed - UOP of 1.4L x24 hours - Continue to hold Lasix for now -- may need to resume based on volume/respiratory status   5. OSA - CPAP after extubation    6. Fever - Became febrile to 101.2 on 8/1, now resolved - As noted above, resp culture with MSSA - Continue Ancef.  Cefepime switched to cefazolin and linezolid stopped due to sensitivity report (MSSA) - Foley removed  - Continue to monitor   7. Hypernatremia -- resolved - Sodium 146. Received  FWF at 250 mL/q4 while TF infusing  - Continue to monitor  8. Constipation -- resolved - BM x5 after enema on 8/4  Darcella Gasman, NP  05/09/2021, 8:05 AM  Advanced Heart Failure Team Pager [phone] (M-F; 7a - 5p)  Please contact [facility] for night-coverage after hours (5p -7a ) and weekends on [email or web address]  Patient seen and examined with the above-signed Advanced Practice Provider and/or Housestaff. I personally reviewed laboratory data, imaging studies and relevant notes. I independently examined the patient and formulated the important aspects of the plan. I have edited the note to reflect any of my changes or salient points. I have personally discussed the plan with the patient and/or family.  Off all pressors/inotropes. Extubated yesterday. Respiratory status stable. On Ancef for MSSA. CVP 9. Co-ox 72%  General:  Sitting up in bed No resp difficulty HEENT: normal  + cor-trak  Neck: supple. JVP 9 Carotids 2+ bilat; no bruits. No lymphadenopathy or thryomegaly appreciated. Cor: PMI nondisplaced. Regular rate & rhythm. No rubs, gallops or murmurs. Lungs: decreased at bases Abdomen: obese soft, nontender, nondistended. No hepatosplenomegaly. No bruits or masses. Good bowel sounds. Extremities: no cyanosis, clubbing, rash, edema Neuro: alert & orientedx3, cranial nerves grossly intact. moves all 4 extremities w/o difficulty. Affect pleasant  Respiratory status stable off vent. Co-ox stable off pressors. Will try to keep him dry with torsemide 40 daily. Remains in NSR but off amio due to ? Of possible amio toxicity.   Continue Bidil. Restart low-dose spiro and Entresto.   Encourage IS and mobility.   Glori Bickers, MD  9:13 AM

## 2021-05-09 NOTE — Progress Notes (Signed)
[facility] Progress Note Patient Name: Jeffrey [name] DOB: Nov 24, 1975 MRN: [id number]   Date of Service  05/09/2021  HPI/Events of Note  Hypokalemia - K+ = 3.5 and Creatinine = 1.89.  eICU Interventions  Will replace K+.     Intervention Category Major Interventions: Electrolyte abnormality - evaluation and management  Blyss Lugar Eugene 05/09/2021, 5:10 AM

## 2021-05-09 NOTE — Progress Notes (Addendum)
Progress Note  Patient Name: Jeffrey [name] Date of Encounter: 05/09/2021  [facility] Cardiologist: Glori Bickers, MD   Subjective   "I'm doing ok"  Inpatient Medications    Scheduled Meds:  ALPRAZolam  0.5 mg Oral BID   apixaban  5 mg Oral BID   Chlorhexidine Gluconate Cloth  6 each Topical Daily   docusate sodium  100 mg Oral BID   feeding supplement (PROSource TF)  45 mL Per Tube TID   free water  250 mL Per Tube Q4H   insulin aspart  0-20 Units Subcutaneous Q4H   insulin aspart  10 Units Subcutaneous Q4H   insulin detemir  50 Units Subcutaneous BID   isosorbide-hydrALAZINE  1 tablet Oral TID   mouth rinse  15 mL Mouth Rinse BID   pantoprazole  40 mg Oral Daily   sacubitril-valsartan  1 tablet Oral BID   sodium chloride flush  10-40 mL Intracatheter Q12H   sodium chloride flush  3 mL Intravenous Q12H   spironolactone  12.5 mg Oral Daily   torsemide  40 mg Oral Daily   Continuous Infusions:  sodium chloride Stopped (04/30/21 2254)   amiodarone 60 mg/hr (05/09/21 0933)   Followed by   amiodarone      ceFAZolin (ANCEF) IV 2 g (05/09/21 0559)   feeding supplement (VITAL AF 1.2 CAL) Stopped (05/09/21 1045)   PRN Meds: sodium chloride, acetaminophen (TYLENOL) oral liquid 160 mg/5 mL, nitroGLYCERIN, ondansetron (ZOFRAN) IV, sodium chloride flush, sodium chloride flush, sorbitol   Vital Signs    Vitals:   05/09/21 0739 05/09/21 0800 05/09/21 0825 05/09/21 0906  BP:  134/79  105/74  Pulse:  83 71 84  Resp:  (!) 23 (!) 26 15  Temp: 98.7 F (37.1 C)     TempSrc: Axillary     SpO2:  98% 97% 97%  Weight:      Height:        Intake/Output Summary (Last 24 hours) at 05/09/2021 1111 Last data filed at 05/09/2021 0900 Gross per 24 hour  Intake 7650.04 ml  Output 1875 ml  Net 5775.04 ml   Last 3 Weights 05/09/2021 05/08/2021 05/07/2021  Weight (lbs) 304 lb 14.3 oz 310 lb 6.5 oz 301 lb 13 oz  Weight (kg) 138.3 kg 140.8 kg 136.9 kg      Telemetry    AFib 130's -  Personally Reviewed  ECG    No new EKGs - Personally Reviewed  Physical Exam   GEN: AAO x4, in NAD Neck: difficult to assess Cardiac: irreg-irreg, tachycardic, no murmurs, rubs, or gallops.  Respiratory: CTA b/l (ant/lat) GI: Soft, nontender MS: no edema; No deformity. Neuro:  no gross focal deficits appreciated Psych: normal affect   Labs    High Sensitivity Troponin:   Recent Labs  Lab 04/29/21 1247 04/29/21 1659  TROPONINIHS 74* 64*      Chemistry Recent Labs  Lab 05/03/21 0407 05/03/21 1600 05/04/21 0340 05/04/21 1549 05/05/21 0508 05/05/21 0535 05/08/21 0416 05/08/21 1715 05/09/21 0329  NA 143   < > 145   < > 149*   < > 148* 152* 146*  K 4.1   < > 4.0   < > 3.7   < > 4.1 3.2* 3.5  CL 110   < > 109   < > 109   < > 112* 120* 112*  CO2 23   < > 28   < > 31   < > '26 24 26  ' GLUCOSE  197*   < > 198*   < > 191*   < > 301* 148* 121*  BUN 53*   < > 52*   < > 57*   < > 75* 86* 78*  CREATININE 2.94*   < > 2.18*   < > 1.98*   < > 2.15* 2.23* 1.89*  CALCIUM 9.6   < > 9.8   < > 10.3   < > 10.9* 10.8* 10.8*  PROT  --   --  6.5  --  6.9  --   --   --   --   ALBUMIN 2.3*  --  2.4*  --  2.3*  --   --   --   --   AST  --   --  17  --  19  --   --   --   --   ALT  --   --  38  --  30  --   --   --   --   ALKPHOS  --   --  44  --  41  --   --   --   --   BILITOT  --   --  1.0  --  1.1  --   --   --   --   GFRNONAA 26*   < > 37*   < > 41*   < > 38* 36* 44*  ANIONGAP 10   < > 8   < > 9   < > '10 8 8   ' < > = values in this interval not displayed.     Hematology Recent Labs  Lab 05/06/21 0330 05/08/21 0416 05/09/21 0329  WBC 11.0* 9.3 8.5  RBC 4.81 4.94 4.91  HGB 13.1 13.2 13.4  HCT 42.0 43.6 42.7  MCV 87.3 88.3 87.0  MCH 27.2 26.7 27.3  MCHC 31.2 30.3 31.4  RDW 18.0* 18.1* 17.5*  PLT 223 325 353    BNP No results for input(s): BNP, PROBNP in the last 168 hours.    DDimer No results for input(s): DDIMER in the last 168 hours.   Radiology    DG Chest 1  View Result Date: 05/01/2021 CLINICAL DATA:  Respiratory failure. EXAM: CHEST  1 VIEW COMPARISON:  04/30/2021. FINDINGS: Endotracheal tube, NG tube, left IJ line in stable position. AICD in stable position. Stable severe cardiomegaly. Diffuse bilateral pulmonary infiltrates/edema again noted. Slight interim improvement. No pleural effusion or pneumothorax. IMPRESSION: 1.  Lines and tubes in stable position. 2. AICD in stable position. Stable severe cardiomegaly. Diffuse bilateral pulmonary infiltrates/edema again noted with slight interim improvement. Electronically Signed   By: Marcello Moores  Register   On: 05/01/2021 06:41     Cardiac Studies    04/30/21: TTE  1. Left ventricular ejection fraction, by estimation, is <20%. The left  ventricle has severely decreased function. The left ventricle demonstrates  global hypokinesis. The left ventricular internal cavity size was severely  dilated. Left ventricular  diastolic parameters are consistent with Grade III diastolic dysfunction  (restrictive). Elevated left atrial pressure.   2. Right ventricular systolic function is moderately reduced. The right  ventricular size is normal. There is mildly elevated pulmonary artery  systolic pressure. The estimated right ventricular systolic pressure is  81.8 mmHg.   3. Left atrial size was severely dilated.   4. The mitral valve is normal in structure. Trivial mitral valve  regurgitation.   5. The aortic valve is tricuspid. Aortic valve regurgitation  is not  visualized. No aortic stenosis is present.   6. The inferior vena cava is dilated in size with <50% respiratory  variability, suggesting right atrial pressure of 15 mmHg.    06/06/2020: TTE IMPRESSIONS   1. Left ventricular ejection fraction, by estimation, is 20 to 25%. The  left ventricle has severely decreased function. The left ventricle  demonstrates global hypokinesis. The left ventricular internal cavity size  was mildly dilated. There is mild  left  ventricular hypertrophy. Left ventricular diastolic parameters are  consistent with Grade I diastolic dysfunction (impaired relaxation).   2. Right ventricular systolic function is normal. The right ventricular  size is normal.   3. Left atrial size was mildly dilated.   4. The mitral valve is normal in structure. Trivial mitral valve  regurgitation. No evidence of mitral stenosis.   5. The aortic valve is normal in structure. Aortic valve regurgitation is  not visualized. No aortic stenosis is present.   6. The inferior vena cava is normal in size with greater than 50%  respiratory variability, suggesting right atrial pressure of 3 mmHg.     05/17/2015: R/LHC Findings:   RA = 7 RV = 42/3/7 PA =  43/30 (39) PCW = 26 Fick cardiac output/index = 4.9/2.1 PVR = 2.7 WU SVR = 1651 FA sat = 95% PA sat = 65%, 65% Ao = 122/97 (109) LV = 110/31/33   Assessment: 1) Normal coronaries 2) Moderately elevated filling pressures with moderately depressed cardiac output    Patient Profile     45 y.o. male with a hx of HTN, renal Ca (s/p  R nephrectomy), CKD (III), DM, OSA, morbid obesity, gout, NICM, chronic CHF (systolic), S-ICD, AFib admitted with AFib RVR and decompensated CHF  Device information [employer] S-ICD implanted 05/17/2015 > gen change 6/31/4970 Gen change complicated by flash pulmonary edema suffered traumatic intubation/aspiration + hx of appropriate shock 2019 for VT   AFib hx Diagnosed 2018 AAD hx Amiodarone started 2018 (also has hx of VT w/shock 2019)  Assessment & Plan      Paroxysmal Afib RVR CHA2DS2Vasc is 3, on eliquis out patient appropriately dosed Chronically on amiodarone for his AF and VT hx  S/p TEE and fairly emergent DCCV 04/30/21  Had trouble weaning vent and ESR was checked, Sed rate came back markedly elevated (128) and amio gtt stopped 05/07/21 with concerns of poss pulm tox with discussion of starting steroids, (looks like he had a single dose  of 24m prednisone ordered/given today) CT chest yesterday without note of interstitial lung disease and in d/w Dr. BHaroldine Laws pulmonary did not suspect elevated sed rate was amio pulmonary toxicity  Maintained SR until this morning amio gtt resumed  EP is asked back on board for guidance with AAD and perhaps consideration of PVI ablation.  Amiodarone is his only AAD really given his VT as well BMI is prohibitive for PVI ablation His S-ICD does not have pacing support, not certain if it is reasonable to consider pacer implant/AV node ablate strategy if unable to gain at least rate control if not rhythm control  Dr. CCurt Bearswill see later today   2. NSVT PVC and NSVT burden up in the last 24 hours Amio restarted   3. Acute/chronic CHF 4. Cardiogenic shock 5. Respiratory failure 6. AKI on CKD Off inotropes and pressors Coox 72 Extubated yesterday Was febrile 8/1, resp cx w/MSSA, afebrile now on abx Creat trending downwards from peak in the 4's   For questions or  updates, please contact [facility] Please consult [email or web address] for contact info under        Signed, Baldwin Jamaica, PA-C  05/09/2021, 11:11 AM    I have seen and examined this patient with Tommye Standard.  Agree with above, note added to reflect my findings.  On exam, irregular, tachycardic.  Unfortunately has gone back into atrial fibrillation.  His amiodarone was held as his ESR was elevated.  This is been discussed with the critical care team who feels that he is not having any current lung issues to preclude amiodarone.  Agree with restarting amiodarone and would plan for cardioversion.  Unfortunately, due to his size, he would not be a very good ablation candidate.  Would try to continue with medical management.  He has an S ICD in place and thus this is not helpful with his atrial fibrillation therapy.  Cecil Bixby M. Tarig Zimmers MD 05/09/2021 1:00 PM

## 2021-05-09 NOTE — Progress Notes (Signed)
RT note. Patient placed on home cpap for the night. Patient sat 100%. RT will continue to monitor

## 2021-05-10 DIAGNOSIS — J9601 Acute respiratory failure with hypoxia: Secondary | ICD-10-CM | POA: Diagnosis not present

## 2021-05-10 DIAGNOSIS — N170 Acute kidney failure with tubular necrosis: Secondary | ICD-10-CM | POA: Diagnosis not present

## 2021-05-10 DIAGNOSIS — I5023 Acute on chronic systolic (congestive) heart failure: Secondary | ICD-10-CM | POA: Diagnosis not present

## 2021-05-10 DIAGNOSIS — N1831 Chronic kidney disease, stage 3a: Secondary | ICD-10-CM | POA: Diagnosis not present

## 2021-05-10 DIAGNOSIS — I480 Paroxysmal atrial fibrillation: Secondary | ICD-10-CM | POA: Diagnosis not present

## 2021-05-10 DIAGNOSIS — R570 Cardiogenic shock: Secondary | ICD-10-CM | POA: Diagnosis not present

## 2021-05-10 LAB — BASIC METABOLIC PANEL
Anion gap: 7 (ref 5–15)
BUN: 55 mg/dL — ABNORMAL HIGH (ref 6–20)
CO2: 25 mmol/L (ref 22–32)
Calcium: 10 mg/dL (ref 8.9–10.3)
Chloride: 110 mmol/L (ref 98–111)
Creatinine, Ser: 1.45 mg/dL — ABNORMAL HIGH (ref 0.61–1.24)
GFR, Estimated: >60 mL/min (ref >60)
Glucose, Bld: 122 mg/dL — ABNORMAL HIGH (ref 70–99)
Potassium: 3 mmol/L — ABNORMAL LOW (ref 3.5–5.1)
Sodium: 142 mmol/L (ref 135–145)

## 2021-05-10 LAB — GLUCOSE, CAPILLARY
Glucose-Capillary: 131 mg/dL — ABNORMAL HIGH (ref 70–99)
Glucose-Capillary: 137 mg/dL — ABNORMAL HIGH (ref 70–99)
Glucose-Capillary: 256 mg/dL — ABNORMAL HIGH (ref 70–99)
Glucose-Capillary: 265 mg/dL — ABNORMAL HIGH (ref 70–99)
Glucose-Capillary: 268 mg/dL — ABNORMAL HIGH (ref 70–99)
Glucose-Capillary: 97 mg/dL (ref 70–99)

## 2021-05-10 LAB — COOXEMETRY PANEL
Carboxyhemoglobin: 0.9 % (ref 0.5–1.5)
Methemoglobin: 0.9 % (ref 0.0–1.5)
O2 Saturation: 59.4 %
Total hemoglobin: 13.3 g/dL (ref 12.0–16.0)

## 2021-05-10 LAB — URIC ACID: Uric Acid, Serum: 10.4 mg/dL — ABNORMAL HIGH (ref 3.7–8.6)

## 2021-05-10 MED ORDER — ROCURONIUM BROMIDE 10 MG/ML (PF) SYRINGE
PREFILLED_SYRINGE | INTRAVENOUS | Status: AC
  Filled 2021-05-10: qty 10

## 2021-05-10 MED ORDER — FENTANYL CITRATE (PF) 100 MCG/2ML IJ SOLN
INTRAMUSCULAR | Status: AC
  Administered 2021-05-10: 100 ug
  Filled 2021-05-10: qty 2

## 2021-05-10 MED ORDER — POTASSIUM CHLORIDE 20 MEQ PO PACK
40.0000 meq | PACK | ORAL | Status: AC
  Administered 2021-05-10 (×2): 40 meq
  Filled 2021-05-10 (×2): qty 2

## 2021-05-10 MED ORDER — KETAMINE HCL 50 MG/5ML IJ SOSY
PREFILLED_SYRINGE | INTRAMUSCULAR | Status: AC
  Filled 2021-05-10: qty 5

## 2021-05-10 MED ORDER — POTASSIUM CHLORIDE CRYS ER 20 MEQ PO TBCR: 40.0000 meq | EXTENDED_RELEASE_TABLET | Freq: Once | ORAL | Status: DC

## 2021-05-10 MED ORDER — MIDAZOLAM HCL 2 MG/2ML IJ SOLN
INTRAMUSCULAR | Status: AC
  Administered 2021-05-10: 2 mg
  Filled 2021-05-10: qty 2

## 2021-05-10 MED ORDER — AMIODARONE LOAD VIA INFUSION
150.0000 mg | Freq: Once | INTRAVENOUS | Status: AC
  Administered 2021-05-10: 150 mg via INTRAVENOUS
  Filled 2021-05-10: qty 83.34

## 2021-05-10 MED ORDER — AMIODARONE LOAD VIA INFUSION
150.0000 mg | Freq: Once | INTRAVENOUS | Status: AC
  Administered 2021-05-10: 150 mg via INTRAVENOUS

## 2021-05-10 MED ORDER — ETOMIDATE 2 MG/ML IV SOLN
INTRAVENOUS | Status: AC
  Administered 2021-05-10: 5 mg
  Filled 2021-05-10: qty 20

## 2021-05-10 NOTE — Progress Notes (Signed)
Patient ID: Jeffrey [name], male   DOB: 1976/05/03, 45 y.o.   MRN: [id number]     Advanced Heart Failure Rounding Note  PCP-Cardiologist: Glori Bickers, MD   Subjective:   04/30/21 Echo: EF < 20%, severe LV dilation, moderate RV dysfunction 05/05/21 Bidil increased to 1 tab tid. Started on eliquis. Febrile to 101.2. Started on zyvox+ cefepime   05/05/21 Sputum Cx: rare staph aureus    8/3: Linezolid and cefepime switched to Ancef   8/5: Afib with RVR. Received amio bolus/load.  EP consulted   Remains in rapid afib this morning.  NPO since MN for cardioversion today.  EP consulted yesterday -- not a candidate for ablation due to body habitus.    GDMT restarted yesterday -- now on torsemide 40 daily, spironolactone 12.5 mg daily and Entresto 24/26 daily.   CVP of 6, co-ox of 52.9, down from 70s.  Net I/O even with 2850 of UOP x24 hours. Weight up 3 pounds from yesterday (304=> 307). Creatinine improved to 1.45.  He is lying flat in bed in no acute distress on 4L of supplemental oxygen (down from 11 yesterday).  He complains of hunger, fatigue and discomfort from the bed.  He also reports right knee pain -- knee is swollen and tender.  He is eager to proceed with the tentatively planned DCCV.    Objective:   Weight Range: (!) 139.3 kg Body mass index is 46.69 kg/m.   Vital Signs:   Temp:  [98.1 F (36.7 C)-99.4 F (37.4 C)] 98.7 F (37.1 C) (08/06 0353) Pulse Rate:  [31-150] 77 (08/06 0700) Resp:  [14-33] 21 (08/06 0700) BP: (70-134)/(54-98) 123/88 (08/06 0622) SpO2:  [85 %-100 %] 99 % (08/06 0700) Weight:  [139.3 kg] 139.3 kg (08/06 0500) Last BM Date: 05/09/21  Weight change: Filed Weights   05/08/21 0500 05/09/21 0338 05/10/21 0500  Weight: (!) 140.8 kg (!) 138.3 kg (!) 139.3 kg    Intake/Output:   Intake/Output Summary (Last 24 hours) at 05/10/2021 0721 Last data filed at 05/10/2021 0700 Gross per 24 hour  Intake 2907.12 ml  Output 2850 ml  Net 57.12 ml        Physical Exam   CVP 6 General:  Alert and oriented.  No acute distress  HEENT: DHT in place.  Normocephalic  Neck: supple. JVP difficult to assess.  Carotids 2+ bilat; no bruits. No lymphadenopathy or thryomegaly appreciated.  Cor: PMI nondisplaced. Irregularly irregular rhythm. No rubs or gallops. Lungs: Clear  Abdomen: obese, soft, nontender, mildly distended. No hepatosplenomegaly. No bruits or masses. + BS.  Extremities: no cyanosis, clubbing, rash, edema. Right knee swelling and tenderness  Neuro: MAE and follows commands.  Telemetry   Afib with rates in the 130s with frequent PVCs (personally reviewed)  Labs    CBC Recent Labs    05/08/21 0416 05/09/21 0329  WBC 9.3 8.5  HGB 13.2 13.4  HCT 43.6 42.7  MCV 88.3 87.0  PLT 325 361    Basic Metabolic Panel Recent Labs    05/08/21 2126 05/09/21 0329 05/10/21 0416  NA  --  146* 142  K  --  3.5 3.0*  CL  --  112* 110  CO2  --  26 25  GLUCOSE  --  121* 122*  BUN  --  78* 55*  CREATININE  --  1.89* 1.45*  CALCIUM  --  10.8* 10.0  MG 2.5*  --   --     Liver Function Tests No results  for input(s): AST, ALT, ALKPHOS, BILITOT, PROT, ALBUMIN in the last 72 hours.  No results for input(s): LIPASE, AMYLASE in the last 72 hours. Cardiac Enzymes No results for input(s): CKTOTAL, CKMB, CKMBINDEX, TROPONINI in the last 72 hours.  BNP: BNP (last 3 results) Recent Labs    11/21/20 2222 04/29/21 1247  BNP 65.0 1,407.7*     ProBNP (last 3 results) No results for input(s): PROBNP in the last 8760 hours.   D-Dimer No results for input(s): DDIMER in the last 72 hours. Hemoglobin A1C No results for input(s): HGBA1C in the last 72 hours.  Fasting Lipid Panel No results for input(s): CHOL, HDL, LDLCALC, TRIG, CHOLHDL, LDLDIRECT in the last 72 hours.  Thyroid Function Tests No results for input(s): TSH, T4TOTAL, T3FREE, THYROIDAB in the last 72 hours.  Invalid input(s): FREET3   Other  results:   Imaging    No results found.   Medications:     Scheduled Medications:  ALPRAZolam  0.5 mg Oral BID   apixaban  5 mg Oral BID   Chlorhexidine Gluconate Cloth  6 each Topical Daily   docusate sodium  100 mg Oral BID   feeding supplement  237 mL Oral BID BM   feeding supplement (PROSource TF)  90 mL Per Tube BID   feeding supplement (VITAL AF 1.2 CAL)  1,000 mL Per Tube Q24H   free water  250 mL Per Tube Q4H   insulin aspart  0-20 Units Subcutaneous Q4H   insulin aspart  10 Units Subcutaneous Q4H   insulin detemir  50 Units Subcutaneous BID   isosorbide-hydrALAZINE  1 tablet Oral TID   mouth rinse  15 mL Mouth Rinse BID   pantoprazole  40 mg Oral Daily   sacubitril-valsartan  1 tablet Oral BID   sodium chloride flush  10-40 mL Intracatheter Q12H   sodium chloride flush  3 mL Intravenous Q12H   spironolactone  12.5 mg Oral Daily   torsemide  40 mg Oral Daily   traZODone  100 mg Oral QHS    Infusions:  sodium chloride Stopped (04/30/21 2254)   amiodarone 30 mg/hr (05/10/21 0700)    ceFAZolin (ANCEF) IV Stopped (05/10/21 0657)    PRN Medications: sodium chloride, acetaminophen, nitroGLYCERIN, ondansetron (ZOFRAN) IV, sodium chloride flush, sodium chloride flush, sorbitol  Assessment/Plan   1. Atrial fibrillation: Paroxysmal - Reverted back to AFRVR with rates in 140s in 8/5.  S/p amio bolus with subsequent IV load.  EP consulted -- recommended continuing drip and cardioversion.  Not a candidate for ablation due to body habitus  - NPO since MN for DCCV today.  Return to normal rhythm is primary concern to avoid recurrence of AF-driven decompensation  - Suspect AF with RVR drove initial CHF exacerbation.  Patient has been on amiodarone and Eliquis at home, has missed some Eliquis doses. Emergent TEE-guided DCCV 7/27 due to inability to control HR - Amiodarone initially stopped due to concern for toxicity, but resumed after reversion to rapid afib on 8/5  -  Continue Eliquis.  - Keep K of at least 4 and Mg of at least 2.  Hypokalemic today at 3 -- replete aggressively   2. 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  Echo in 9/21 with EF 20-25%.  Echo this admission with EF < 20%, severe LV dilation, moderate RV dysfunction.   7/27: intubated with pulmonary edema and required addition of NE. Off NE.  Now extubated and off  all pressors  - Dobutamine weaned off on 8/3  -  Continue BiDil 1 TID.  Entresto, spironolactone and torsemide resumed on 8/5.  Can possibly resume Jardiance today -- will evaluate after cardioversion to ensure stability  - Beta blocker held in the setting of recent shock   3. Acute hypoxemic respiratory failure -- improving - Resp culture + for rare S. Aureus -- noted to be MSSA  - Failed vent wean and ultimately required increasing vent settings on 8/2-8/4.  Became febrile on 8/1 and started on linezolid and cefepime. Cefepime switched to Ancef and Linezolid discontinued on 8/3  - Initial concern for amiodarone toxicity -- amio stopped and steroids started, but steroids stopped the next morning on 8/4 -- per CCM team, more likely atelectasis driving hypoxia.  Deferred management to them  - Extubated on 8/4, initially to BiPAP and now weaned to 4L of [place]    - Incentive spirometry and flutter valve ordered  - Continue Ancef through 8/4   - DHT in place per nutrition to ensure adequate PO intake.  No TF infusing at this time   4. AKI on CKD stage 3  - Has solitary kidney.   - Suspect cardiorenal syndrome/low output with AF/RVR in setting of cardiomyopathy.  Creatinine peaked at 4.65, now 1.45 - Baseline creatinine is 1.5-1.7  - Urinary obstruction likely played a role. Urology placed foley catheter with improvement in UOP, but now has condom catheter in place. Foley removed due to concern for infection after fever developed - UOP of 2.85L x24 hours  - Torsemide resumed on  8/5    5. OSA - CPAP after extubation    6. Fever - Became febrile to 101.2 on 8/1, now resolved - As noted above, resp culture with MSSA - Continue Ancef through 8/7.  Cefepime switched to cefazolin and linezolid stopped due to sensitivity report (MSSA) - Foley removed  - Continue to monitor   7. Hypernatremia -- resolved - Sodium 142. Received FWF at 250 mL/q4 while TF infusing  - Continue to monitor  8. Hypokalemia - K of 3 on 8/5 -- replete aggressively   9. Constipation -- resolved - BM x5 after enema on 8/4  10. Knee pain - Has h/o gout  - Will give prednisone x2 days, then resume home allopurinol   Darcella Gasman, NP  05/10/2021, 7:21 AM  Advanced Heart Failure Team Pager [phone] (M-F; 7a - 5p)  Please contact [facility] for night-coverage after hours (5p -7a ) and weekends on [email or web address]  Patient seen and examined with the above-signed Advanced Practice Provider and/or Housestaff. I personally reviewed laboratory data, imaging studies and relevant notes. I independently examined the patient and formulated the important aspects of the plan. I have edited the note to reflect any of my changes or salient points. I have personally discussed the plan with the patient and/or family.  Developed recurrent AF with RVR yesterday after intubation. IV amio restarted. Remains in AF with RVR with rates 130-150. Denies CP or SOB. CVP 6. SCr improved. Co-ox 59%. TFs stopped. On Ancef through 8/7 for MSSA PNA  General:  Lying in bed No resp difficulty HEENT: normal Neck: supple. no JVD. Carotids 2+ bilat; no bruits. No lymphadenopathy or thryomegaly appreciated. Cor: PMI nondisplaced. Irreg tachy  No rubs, gallops or murmurs. Lungs: clear Abdomen:obese soft, nontender, nondistended. No hepatosplenomegaly. No bruits or masses. Good bowel sounds. Extremities: no cyanosis, clubbing, rash, edema Neuro: alert & orientedx3, cranial nerves grossly intact. moves  all 4 extremities w/o  difficulty. Affect pleasant  Respiratory status stable. Back in AF with RVR after extubation. D/w CCM and EP and we all feel IV amio and DC-CV are his only options at this point. (ESR 128 but felt not to have amio toxicity clinically and by chest CT).   Will plan DC-CV today. Hopefully can maintain NSR.   Discussions about need for advanced therapies started.  Glori Bickers, MD  9:32 AM

## 2021-05-10 NOTE — Progress Notes (Signed)
RT placed patient on his home CPAP with 2L O2 bled into circuit. Patient tolerating settings well at this time. RT will monitor as needed.

## 2021-05-10 NOTE — Procedures (Signed)
PCCM:  I was asked to assist in cardioversion with Dr. [name].  Anesthesia was unable to come to bedside to provide any sedation.   Patient went into rapid afib now blood pressure more unstable and clinically unable to wait for cardioversion.   RT was notified. All supplies for intubation if needed were made available.   Time out performed.  Patient sedated with 253mg Fentanyl, '4mg'$  versed, 2.'5mg'$  etomidate Patient received cardioversion which was successful  ETCO2 was monitored  O2 sats stable during procedure.   Patient tolerated the procedure well.  15 mins of moderate sedation   BGarner Nash DO [facility] 05/10/2021 11:20 AM

## 2021-05-10 NOTE — CV Procedure (Signed)
    DIRECT CURRENT CARDIOVERSION  NAME:  Jeffrey [name]   MRN: [id number] DOB:  [birth year]   ADMIT DATE: 04/29/2021   INDICATIONS: Atrial fibrillation    PROCEDURE:   Informed consent was obtained prior to the procedure. The risks, benefits and alternatives for the procedure were discussed and the patient comprehended these risks. Once an appropriate time out was taken, the patient had the defibrillator pads placed in the anterior and posterior position. The patient then underwent sedation by the CCM service (anesthesia team contacted and unavailable to assist). Once an appropriate level of sedation was achieved, the patient received a single biphasic, synchronized 200J shock with prompt conversion to sinus rhythm. No apparent complications.  Glori Bickers, MD  12:18 PM

## 2021-05-10 NOTE — Plan of Care (Signed)
  Problem: Clinical Measurements: Goal: Respiratory complications will improve Outcome: Progressing Goal: Cardiovascular complication will be avoided Outcome: Progressing   Problem: Activity: Goal: Risk for activity intolerance will decrease Outcome: Progressing   Problem: Nutrition: Goal: Adequate nutrition will be maintained Outcome: Progressing   Problem: Coping: Goal: Level of anxiety will decrease Outcome: Progressing   Problem: Elimination: Goal: Will not experience complications related to bowel motility Outcome: Progressing Goal: Will not experience complications related to urinary retention Outcome: Progressing   Problem: Pain Managment: Goal: General experience of comfort will improve Outcome: Progressing   

## 2021-05-10 NOTE — Progress Notes (Signed)
OT Cancellation Note  Patient Details Name: Jeffrey [name] MRN: [id number] DOB: [birth year]   Cancelled Treatment:    Reason Eval/Treat Not Completed: Patient prepping for DCCV.  OT to continue efforts.    Sankalp Ferrell D Archana Eckman 05/10/2021, 10:19 AM

## 2021-05-11 DIAGNOSIS — R570 Cardiogenic shock: Secondary | ICD-10-CM | POA: Diagnosis not present

## 2021-05-11 DIAGNOSIS — J9601 Acute respiratory failure with hypoxia: Secondary | ICD-10-CM | POA: Diagnosis not present

## 2021-05-11 DIAGNOSIS — N170 Acute kidney failure with tubular necrosis: Secondary | ICD-10-CM | POA: Diagnosis not present

## 2021-05-11 DIAGNOSIS — I480 Paroxysmal atrial fibrillation: Secondary | ICD-10-CM | POA: Diagnosis not present

## 2021-05-11 DIAGNOSIS — N1831 Chronic kidney disease, stage 3a: Secondary | ICD-10-CM | POA: Diagnosis not present

## 2021-05-11 DIAGNOSIS — I5023 Acute on chronic systolic (congestive) heart failure: Secondary | ICD-10-CM | POA: Diagnosis not present

## 2021-05-11 LAB — GLUCOSE, CAPILLARY
Glucose-Capillary: 257 mg/dL — ABNORMAL HIGH (ref 70–99)
Glucose-Capillary: 188 mg/dL — ABNORMAL HIGH (ref 70–99)
Glucose-Capillary: 234 mg/dL — ABNORMAL HIGH (ref 70–99)
Glucose-Capillary: 367 mg/dL — ABNORMAL HIGH (ref 70–99)
Glucose-Capillary: 189 mg/dL — ABNORMAL HIGH (ref 70–99)

## 2021-05-11 LAB — COOXEMETRY PANEL
Carboxyhemoglobin: 0.9 % (ref 0.5–1.5)
Methemoglobin: 1 % (ref 0.0–1.5)
O2 Saturation: 61.4 %
Total hemoglobin: 13.5 g/dL (ref 12.0–16.0)

## 2021-05-11 LAB — BASIC METABOLIC PANEL
Anion gap: 6 (ref 5–15)
BUN: 46 mg/dL — ABNORMAL HIGH (ref 6–20)
CO2: 24 mmol/L (ref 22–32)
Calcium: 9.5 mg/dL (ref 8.9–10.3)
Chloride: 109 mmol/L (ref 98–111)
Creatinine, Ser: 1.56 mg/dL — ABNORMAL HIGH (ref 0.61–1.24)
GFR, Estimated: 55 mL/min — ABNORMAL LOW (ref >60)
Glucose, Bld: 268 mg/dL — ABNORMAL HIGH (ref 70–99)
Potassium: 3.9 mmol/L (ref 3.5–5.1)
Sodium: 139 mmol/L (ref 135–145)

## 2021-05-11 MED ORDER — TRAMADOL HCL 50 MG PO TABS
100.0000 mg | ORAL_TABLET | Freq: Three times a day (TID) | ORAL | Status: DC | PRN
  Administered 2021-05-11 – 2021-05-12 (×2): 100 mg via ORAL
  Filled 2021-05-11 (×2): qty 2

## 2021-05-11 MED ORDER — PREDNISONE 20 MG PO TABS
40.0000 mg | ORAL_TABLET | Freq: Every day | ORAL | Status: AC
  Administered 2021-05-11 – 2021-05-13 (×3): 40 mg via ORAL
  Filled 2021-05-11 (×3): qty 2

## 2021-05-11 MED ORDER — PROSOURCE TF PO LIQD
90.0000 mL | Freq: Two times a day (BID) | ORAL | Status: DC
  Administered 2021-05-12: 90 mL

## 2021-05-11 MED ORDER — EMPAGLIFLOZIN 10 MG PO TABS
10.0000 mg | ORAL_TABLET | Freq: Every day | ORAL | Status: DC
  Administered 2021-05-11 – 2021-05-22 (×12): 10 mg via ORAL
  Filled 2021-05-11 (×12): qty 1

## 2021-05-11 NOTE — Progress Notes (Signed)
OT Cancellation Note  Patient Details Name: TABOR TRUSSELL MRN: [id number] DOB: 1976/08/12   Cancelled Treatment:    Reason Eval/Treat Not Completed: Other (comment). Gout flare up and literally getting ready to transfer from [place] to [place]. Will re-attempt tomorrow.  [place], OTR/L Acute Rehab Services Pager [phone] Office [phone]    Almon Register 05/11/2021, 2:42 PM

## 2021-05-11 NOTE — Progress Notes (Addendum)
Inpatient Diabetes Program Recommendations  AACE/ADA: New Consensus Statement on Inpatient Glycemic Control (2015)  Target Ranges:  Prepandial:   less than 140 mg/dL      Peak postprandial:   less than 180 mg/dL (1-2 hours)      Critically ill patients:  140 - 180 mg/dL   Results for Jeffrey [name], Jeffrey [name] (MRN [id number]) as of 05/11/2021 11:35  Ref. Range 05/09/2021 23:55 05/10/2021 03:44 05/10/2021 07:37 05/10/2021 11:31 05/10/2021 15:42 05/10/2021 19:56  Glucose-Capillary Latest Ref Range: 70 - 99 mg/dL 204 (H)  7 units NOVOLOG  131 (H)  3 units NOVOLOG  97 137 (H)  3 units NOVOLOG  50 units Levemir  256 (H)  11 units NOVOLOG  268 (H)  21 units NOVOLOG  50 units Levemir   Results for Jeffrey [name], Jeffrey [name] (MRN [id number]) as of 05/11/2021 11:35  Ref. Range 05/10/2021 23:43 05/11/2021 03:50 05/11/2021 07:31  Glucose-Capillary Latest Ref Range: 70 - 99 mg/dL 265 (H)  21 units NOVOLOG  257 (H)  21 units NOVOLOG  188 (H)  14 units NOVOLOG  50 units Levemir '@1002'$       Home DM Meds: 70/30 Insulin 38 units BID     Novolog 0-15 units TID     Glipizide 10 mg BID     Jardiance 10 mg Daily     Metformin 1000 mg BID  Current Orders: Novolog Resistant Correction Scale/ SSI (0-20 units) Q4 hours      Novolog 10 units Q4 hours     Jardiance 10 mg Daily     Levemir 50 units BID     Prednisone 40 mg Daily  Note Nocturnal Tube feeds have been stopped--Expect some improvement with CBGs now that tube feeds have been stopped  Currently getting double the amount of basal insulin pt normally gets with his 70/30 Insulin BID    Please consider:  1. Stop the Novolog 10 units Q4 hours (this was ordered for tube feed coverage)  2. Change the Novolog SSi to TID [facility] + HS--Pt now allowed solid PO diet  3. Start Novolog Meal Coverage to cover the Carbs in his solid PO diet: Novolog 6 units TID with meals Hold if pt eats <50% of meal, Hold if pt NPO  4. If patient starts to have issues with Hypoglycemia,  could be due to large dose of Levemir May need to reduce Levemir at some point     --Will follow patient during hospitalization--  Wyn Quaker RN, MSN, CDE Diabetes Coordinator Inpatient Glycemic Control Team Team Pager: [phone] (8a-5p)

## 2021-05-11 NOTE — Progress Notes (Addendum)
Patient ID: Jeffrey [name], male   DOB: May 14, 1976, 45 y.o.   MRN: [id number]     Advanced Heart Failure Rounding Note  PCP-Cardiologist: Glori Bickers, MD   Subjective:    04/30/21 Echo: EF < 20%, severe LV dilation, moderate RV dysfunction 05/05/21 Bidil increased to 1 tab tid. Started on eliquis. Febrile to 101.2. Started on zyvox+ cefepime 05/05/21 Sputum Cx: rare staph aureus   8/3: Linezolid and cefepime switched to Ancef 8/5: Afib with RVR. Received amio bolus/load.  EP consulted  8/6 DC-CV  Cardioverted back to NSR yesterday. Remains in NSR. GDMT restarted.   Denies SOB, orthopnea or PND. Complaining of severe gout pain in his feet L>>R. Prednisone started this am.   Objective:   Weight Range: (!) 138.8 kg Body mass index is 46.53 kg/m.   Vital Signs:   Temp:  [97.8 F (36.6 C)-99.7 F (37.6 C)] 99.5 F (37.5 C) (08/07 0733) Pulse Rate:  [29-101] 99 (08/07 0700) Resp:  [18-38] 35 (08/07 0700) BP: (92-134)/(54-97) 101/69 (08/07 0700) SpO2:  [90 %-100 %] 99 % (08/07 0700) Weight:  [138.8 kg] 138.8 kg (08/07 0500) Last BM Date: 05/10/21  Weight change: Filed Weights   05/09/21 0338 05/10/21 0500 05/11/21 0500  Weight: (!) 138.3 kg (!) 139.3 kg (!) 138.8 kg    Intake/Output:   Intake/Output Summary (Last 24 hours) at 05/11/2021 1021 Last data filed at 05/11/2021 0600 Gross per 24 hour  Intake 1144.95 ml  Output 1725 ml  Net -580.05 ml       Physical Exam    CVP 9 General:  Sitting up in bed HEENT: normal Neck: supple. no JVD. Carotids 2+ bilat; no bruits. No lymphadenopathy or thryomegaly appreciated. Cor: PMI nondisplaced. Regular rate & rhythm. No rubs, gallops or murmurs. Lungs: clear Abdomen: soft, nontender, nondistended. No hepatosplenomegaly. No bruits or masses. Good bowel sounds. Extremities: no cyanosis, clubbing, rash, edema Neuro: alert & orientedx3, cranial nerves grossly intact. moves all 4 extremities w/o difficulty. Affect  pleasant   Telemetry   Sinus 90-100 + PVCs Personally reviewed  Labs    CBC Recent Labs    05/09/21 0329  WBC 8.5  HGB 13.4  HCT 42.7  MCV 87.0  PLT 0000000    Basic Metabolic Panel Recent Labs    05/08/21 2126 05/09/21 0329 05/10/21 0416 05/11/21 0422  NA  --    < > 142 139  K  --    < > 3.0* 3.9  CL  --    < > 110 109  CO2  --    < > 25 24  GLUCOSE  --    < > 122* 268*  BUN  --    < > 55* 46*  CREATININE  --    < > 1.45* 1.56*  CALCIUM  --    < > 10.0 9.5  MG 2.5*  --   --   --    < > = values in this interval not displayed.    Liver Function Tests No results for input(s): AST, ALT, ALKPHOS, BILITOT, PROT, ALBUMIN in the last 72 hours.  No results for input(s): LIPASE, AMYLASE in the last 72 hours. Cardiac Enzymes No results for input(s): CKTOTAL, CKMB, CKMBINDEX, TROPONINI in the last 72 hours.  BNP: BNP (last 3 results) Recent Labs    11/21/20 2222 04/29/21 1247  BNP 65.0 1,407.7*     ProBNP (last 3 results) No results for input(s): PROBNP in the last 8760 hours.  D-Dimer No results for input(s): DDIMER in the last 72 hours. Hemoglobin A1C No results for input(s): HGBA1C in the last 72 hours.  Fasting Lipid Panel No results for input(s): CHOL, HDL, LDLCALC, TRIG, CHOLHDL, LDLDIRECT in the last 72 hours.  Thyroid Function Tests No results for input(s): TSH, T4TOTAL, T3FREE, THYROIDAB in the last 72 hours.  Invalid input(s): FREET3   Other results:   Imaging    No results found.   Medications:     Scheduled Medications:  ALPRAZolam  0.5 mg Oral BID   apixaban  5 mg Oral BID   Chlorhexidine Gluconate Cloth  6 each Topical Daily   docusate sodium  100 mg Oral BID   feeding supplement  237 mL Oral BID BM   feeding supplement (PROSource TF)  90 mL Per Tube BID   feeding supplement (VITAL AF 1.2 CAL)  1,000 mL Per Tube Q24H   free water  250 mL Per Tube Q4H   insulin aspart  0-20 Units Subcutaneous Q4H   insulin aspart  10  Units Subcutaneous Q4H   insulin detemir  50 Units Subcutaneous BID   isosorbide-hydrALAZINE  1 tablet Oral TID   mouth rinse  15 mL Mouth Rinse BID   pantoprazole  40 mg Oral Daily   predniSONE  40 mg Oral Q breakfast   sacubitril-valsartan  1 tablet Oral BID   sodium chloride flush  10-40 mL Intracatheter Q12H   sodium chloride flush  3 mL Intravenous Q12H   spironolactone  12.5 mg Oral Daily   torsemide  40 mg Oral Daily   traZODone  100 mg Oral QHS    Infusions:  sodium chloride Stopped (04/30/21 2254)   amiodarone 30 mg/hr (05/11/21 0600)    ceFAZolin (ANCEF) IV 2 g (05/11/21 0620)    PRN Medications: sodium chloride, acetaminophen, nitroGLYCERIN, ondansetron (ZOFRAN) IV, sodium chloride flush, sodium chloride flush, sorbitol  Assessment/Plan   1. Atrial fibrillation: Paroxysmal - Suspect AF with RVR drove initial CHF exacerbation. Emergent TEE-guided DCCV 7/27 due to inability to control HR - Recurrent AF on 8/5. s/p DC-CV on 05/10/21. Remains in NSR today - Seen by EP. Not a candidate for ablation due to body habitus  (could consider AVN ablation and CRT , if needed)  - Amiodarone initially stopped due to concern for toxicity, but resumed after reversion to rapid afib on 8/5  - Continue Eliquis.  - Keep K> 4 Mg > 2  2. 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  Echo in 9/21 with EF 20-25%.  Echo this admission with EF < 20%, severe LV dilation, moderate RV dysfunction.   - s/p S-ICD - 7/27: intubated with pulmonary edema and required addition of NE. Off NE.  Now extubated and off all pressors  - Dobutamine weaned off on 8/3  - Continue Bidil 1 tid - Entresto 24/26 bid - Spiro 12.5 daily  - Add back Jardiance 10  - VAD work-up started.   3. Acute hypoxemic respiratory failure - Resp culture + for rare S. Aureus (MSSA)  - Became febrile on 8/1 and started on linezolid and cefepime. Cefepime switched to Ancef  and Linezolid discontinued on 8/3. Ancef stopped 8/7 - Initial concern for amiodarone toxicity -- amio stopped and steroids started, but steroids stopped the next morning on 8/4 -- per CCM team, more likely atelectasis driving hypoxia.  Deferred management to them  - Extubated on 8/4, initially to BiPAP and now on  [place] - Incentive spirometry and flutter valve ordered   4. AKI on CKD stage 3  - Has solitary kidney.   - Suspect cardiorenal syndrome/low output with AF/RVR in setting of cardiomyopathy.  Creatinine peaked at 4.65, now 1.45 - Baseline creatinine is 1.5-1.7  - Urinary obstruction likely played a role. Urology placed foley catheter with improvement in UOP, but now has condom catheter in place. Foley removed due to concern for infection after fever developed - SCr stable 1.5-1.6   5. OSA - CPAP after extubation    6. Hypernatremia  - resolved  7. Hypokalemia - K 3.9. Wil supp  8. Acute gout - Has h/o gout . Uric acid 10.4 - Will give prednisone x3 days, then resume home allopurinol   9. DM2. Poorly controlled - resume Jardiance - continue SSI - HgbA1c 9.4 - Will get DM consult   [facility] go to [facility]  Glori Bickers, MD  05/11/2021, 10:21 AM  Advanced Heart Failure Team Pager [phone] (M-F; 7a - 5p)  Please contact [name] for night-coverage after hours (5p -7a ) and weekends on [email or web address]

## 2021-05-12 ENCOUNTER — Encounter (INDEPENDENT_AMBULATORY_CARE_PROVIDER_SITE_OTHER): Payer: Self-pay

## 2021-05-12 ENCOUNTER — Inpatient Hospital Stay (HOSPITAL_COMMUNITY): Payer: Medicaid Other

## 2021-05-12 ENCOUNTER — Other Ambulatory Visit: Payer: Self-pay

## 2021-05-12 DIAGNOSIS — I480 Paroxysmal atrial fibrillation: Secondary | ICD-10-CM | POA: Diagnosis not present

## 2021-05-12 DIAGNOSIS — R34 Anuria and oliguria: Secondary | ICD-10-CM | POA: Diagnosis not present

## 2021-05-12 DIAGNOSIS — I130 Hypertensive heart and chronic kidney disease with heart failure and stage 1 through stage 4 chronic kidney disease, or unspecified chronic kidney disease: Secondary | ICD-10-CM | POA: Diagnosis not present

## 2021-05-12 DIAGNOSIS — Z01818 Encounter for other preprocedural examination: Secondary | ICD-10-CM | POA: Diagnosis not present

## 2021-05-12 DIAGNOSIS — E870 Hyperosmolality and hypernatremia: Secondary | ICD-10-CM | POA: Diagnosis not present

## 2021-05-12 DIAGNOSIS — J9601 Acute respiratory failure with hypoxia: Secondary | ICD-10-CM | POA: Diagnosis not present

## 2021-05-12 DIAGNOSIS — I5023 Acute on chronic systolic (congestive) heart failure: Secondary | ICD-10-CM | POA: Diagnosis not present

## 2021-05-12 DIAGNOSIS — I4819 Other persistent atrial fibrillation: Secondary | ICD-10-CM | POA: Diagnosis not present

## 2021-05-12 DIAGNOSIS — N1831 Chronic kidney disease, stage 3a: Secondary | ICD-10-CM | POA: Diagnosis not present

## 2021-05-12 DIAGNOSIS — R570 Cardiogenic shock: Secondary | ICD-10-CM | POA: Diagnosis not present

## 2021-05-12 DIAGNOSIS — J810 Acute pulmonary edema: Secondary | ICD-10-CM | POA: Diagnosis not present

## 2021-05-12 DIAGNOSIS — K6389 Other specified diseases of intestine: Secondary | ICD-10-CM | POA: Diagnosis not present

## 2021-05-12 DIAGNOSIS — N170 Acute kidney failure with tubular necrosis: Secondary | ICD-10-CM | POA: Diagnosis not present

## 2021-05-12 DIAGNOSIS — I428 Other cardiomyopathies: Secondary | ICD-10-CM | POA: Diagnosis not present

## 2021-05-12 DIAGNOSIS — J15211 Pneumonia due to Methicillin susceptible Staphylococcus aureus: Secondary | ICD-10-CM | POA: Diagnosis not present

## 2021-05-12 DIAGNOSIS — R600 Localized edema: Secondary | ICD-10-CM | POA: Diagnosis not present

## 2021-05-12 DIAGNOSIS — J9602 Acute respiratory failure with hypercapnia: Secondary | ICD-10-CM | POA: Diagnosis not present

## 2021-05-12 DIAGNOSIS — Q8909 Congenital malformations of spleen: Secondary | ICD-10-CM | POA: Diagnosis not present

## 2021-05-12 DIAGNOSIS — Z20822 Contact with and (suspected) exposure to covid-19: Secondary | ICD-10-CM | POA: Diagnosis not present

## 2021-05-12 LAB — POCT I-STAT 7, (LYTES, BLD GAS, ICA,H+H)
Acid-Base Excess: 0 mmol/L (ref 0.0–2.0)
Bicarbonate: 24.1 mmol/L (ref 20.0–28.0)
Calcium, Ion: 1.4 mmol/L (ref 1.15–1.40)
HCT: 40 % (ref 39.0–52.0)
Hemoglobin: 13.6 g/dL (ref 13.0–17.0)
O2 Saturation: 95 %
Potassium: 3.6 mmol/L (ref 3.5–5.1)
Sodium: 141 mmol/L (ref 135–145)
TCO2: 25 mmol/L (ref 22–32)
pCO2 arterial: 35.5 mmHg (ref 32.0–48.0)
pH, Arterial: 7.44 (ref 7.350–7.450)
pO2, Arterial: 74 mmHg — ABNORMAL LOW (ref 83.0–108.0)

## 2021-05-12 LAB — URINALYSIS, ROUTINE W REFLEX MICROSCOPIC
Bacteria, UA: NONE SEEN
Bilirubin Urine: NEGATIVE
Glucose, UA: >=500 mg/dL — AB
Ketones, ur: NEGATIVE mg/dL
Leukocytes,Ua: NEGATIVE
Nitrite: NEGATIVE
Protein, ur: NEGATIVE mg/dL
Specific Gravity, Urine: 1.011 (ref 1.005–1.030)
pH: 6 (ref 5.0–8.0)

## 2021-05-12 LAB — GLUCOSE, CAPILLARY
Glucose-Capillary: 132 mg/dL — ABNORMAL HIGH (ref 70–99)
Glucose-Capillary: 144 mg/dL — ABNORMAL HIGH (ref 70–99)
Glucose-Capillary: 149 mg/dL — ABNORMAL HIGH (ref 70–99)
Glucose-Capillary: 216 mg/dL — ABNORMAL HIGH (ref 70–99)
Glucose-Capillary: 368 mg/dL — ABNORMAL HIGH (ref 70–99)
Glucose-Capillary: 99 mg/dL (ref 70–99)

## 2021-05-12 LAB — BASIC METABOLIC PANEL
Anion gap: 7 (ref 5–15)
BUN: 42 mg/dL — ABNORMAL HIGH (ref 6–20)
CO2: 26 mmol/L (ref 22–32)
Calcium: 9.9 mg/dL (ref 8.9–10.3)
Chloride: 108 mmol/L (ref 98–111)
Creatinine, Ser: 1.64 mg/dL — ABNORMAL HIGH (ref 0.61–1.24)
GFR, Estimated: 52 mL/min — ABNORMAL LOW (ref >60)
Glucose, Bld: 100 mg/dL — ABNORMAL HIGH (ref 70–99)
Potassium: 3.7 mmol/L (ref 3.5–5.1)
Sodium: 141 mmol/L (ref 135–145)

## 2021-05-12 LAB — HEPATITIS C ANTIBODY: HCV Ab: NONREACTIVE

## 2021-05-12 LAB — RAPID URINE DRUG SCREEN, HOSP PERFORMED
Amphetamines: NOT DETECTED
Barbiturates: NOT DETECTED
Benzodiazepines: POSITIVE — AB
Cocaine: NOT DETECTED
Opiates: NOT DETECTED
Tetrahydrocannabinol: POSITIVE — AB

## 2021-05-12 LAB — HEPATITIS B CORE ANTIBODY, TOTAL: Hep B Core Total Ab: NONREACTIVE

## 2021-05-12 LAB — URIC ACID: Uric Acid, Serum: 10.1 mg/dL — ABNORMAL HIGH (ref 3.7–8.6)

## 2021-05-12 LAB — PREALBUMIN: Prealbumin: 18.1 mg/dL (ref 18–38)

## 2021-05-12 LAB — T4, FREE: Free T4: 1.16 ng/dL — ABNORMAL HIGH (ref 0.61–1.12)

## 2021-05-12 LAB — LIPID PANEL
Cholesterol: 149 mg/dL (ref 0–200)
HDL: 31 mg/dL — ABNORMAL LOW (ref >40)
LDL Cholesterol: 94 mg/dL (ref 0–99)
Total CHOL/HDL Ratio: 4.8 RATIO
Triglycerides: 120 mg/dL (ref <150)
VLDL: 24 mg/dL (ref 0–40)

## 2021-05-12 LAB — PROTIME-INR
INR: 1.2 (ref 0.8–1.2)
Prothrombin Time: 15.6 seconds — ABNORMAL HIGH (ref 11.4–15.2)

## 2021-05-12 LAB — PSA: Prostatic Specific Antigen: 1.84 ng/mL (ref 0.00–4.00)

## 2021-05-12 LAB — LACTATE DEHYDROGENASE: LDH: 187 U/L (ref 98–192)

## 2021-05-12 LAB — HEPATITIS B SURFACE ANTIGEN: Hepatitis B Surface Ag: NONREACTIVE

## 2021-05-12 LAB — APTT: aPTT: 30 seconds (ref 24–36)

## 2021-05-12 LAB — ANTITHROMBIN III: AntiThromb III Func: 105 % (ref 75–120)

## 2021-05-12 MED ORDER — TRAMADOL HCL 50 MG PO TABS
100.0000 mg | ORAL_TABLET | Freq: Two times a day (BID) | ORAL | Status: DC | PRN
  Administered 2021-05-14 – 2021-05-19 (×6): 100 mg via ORAL
  Filled 2021-05-12 (×7): qty 2

## 2021-05-12 MED ORDER — SPIRONOLACTONE 12.5 MG HALF TABLET
12.5000 mg | ORAL_TABLET | Freq: Once | ORAL | Status: AC
  Administered 2021-05-12: 12.5 mg via ORAL
  Filled 2021-05-12: qty 1

## 2021-05-12 MED ORDER — SODIUM CHLORIDE 0.9 % IV SOLN: INTRAVENOUS | Status: DC

## 2021-05-12 MED ORDER — COLCHICINE 0.6 MG PO TABS
0.6000 mg | ORAL_TABLET | Freq: Two times a day (BID) | ORAL | Status: DC
  Administered 2021-05-12 – 2021-05-15 (×8): 0.6 mg via ORAL
  Filled 2021-05-12 (×9): qty 1

## 2021-05-12 MED ORDER — POTASSIUM CHLORIDE CRYS ER 20 MEQ PO TBCR
40.0000 meq | EXTENDED_RELEASE_TABLET | Freq: Once | ORAL | Status: AC
  Administered 2021-05-12: 40 meq via ORAL
  Filled 2021-05-12: qty 2

## 2021-05-12 MED ORDER — SPIRONOLACTONE 25 MG PO TABS
25.0000 mg | ORAL_TABLET | Freq: Every day | ORAL | Status: DC
  Administered 2021-05-13 – 2021-05-22 (×10): 25 mg via ORAL
  Filled 2021-05-12 (×10): qty 1

## 2021-05-12 MED ORDER — SODIUM CHLORIDE 0.9% FLUSH
3.0000 mL | Freq: Two times a day (BID) | INTRAVENOUS | Status: DC
  Administered 2021-05-12: 3 mL via INTRAVENOUS

## 2021-05-12 MED ORDER — SODIUM CHLORIDE 0.9 % IV SOLN: 250.0000 mL | INTRAVENOUS | Status: DC | PRN

## 2021-05-12 MED ORDER — SODIUM CHLORIDE 0.9% FLUSH: 3.0000 mL | INTRAVENOUS | Status: DC | PRN

## 2021-05-12 NOTE — Progress Notes (Signed)
MCS EDUCATION NOTE:                VAD evaluation consent reviewed and signed by Jeffrey [name] and his wife Jeffrey [name]. Initial VAD teaching completed with pt and caregiver.   VAD educational packet including "Understanding Your Options with Advanced Heart Failure", "[place] Patient Agreement for VAD Evaluation and Potential Implantation" consent, and [employer] "Living a More Active Life" HM III booklet", "[place] HM III Patient Education", "[place] Mechanical Circulatory Support Program", and "Decision Aids for Left Ventricular Assist Device" reviewed in detail and left at bedside for continued reference.    All questions answered regarding VAD implant, hospital stay, and what to expect when discharged home living with a heart pump. Pt identified Jeffrey [name] (his wife) as his primary caregiver. They will both think about who could possibly be a secondary caregiver. Explained need for 24/7 care when pt is discharged home due to sternal precautions, adaptation to living on support, emotional support, consistent and meticulous exit site care and management, medication adherence and high volume of follow up visits with the [facility] after discharge; both pt and caregiver verbalized understanding of above.   Explained that LVAD can be implanted for two indications in the setting of advanced left ventricular heart failure treatment:  Bridge to transplant - used for patients who cannot safely wait for heart transplant without this device.  Or    Destination therapy - used for patients until end of life or recovery of heart function.  Patient and caregiver(s) acknowledge that the indication at this point in time for LVAD therapy would be for destination therapy due to currently smoking and A1C 9.4.   Provided brief equipment overview and demonstration with HeartMate III  training loop including discussion on the following:   a) power module   b) system controller   c) universal Charity fundraiser   d)  battery clips   e) Batteries   f)  Perc lock   g) Percutaneous lead   Reviewed and supplied a copy of home inspection check list stressing that only three pronged grounded power outlets can be used for VAD equipment. Both confirmed home has electrical outlets that will support the equipment along with access working telephone.  Identified the following lifestyle modifications while living on MCS:    1. No driving for at least three months and then only if doctor gives permission to do so.   2. No tub baths while pump implanted, and shower only when doctor gives permission.   3. No swimming or submersion in water while implanted with pump.   4. No contact sports or engaging in jumping activities.   5. Always have a backup controller, charged spare batteries, and battery clips nearby at all times in case of emergency.   6. Call the doctor or hospital contact person if any change in how the pump sounds, feels, or works.   7. Plan to sleep only when connected to the power module.   8. Do not sleep on your stomach.   9. Keep a backup system controller, charged batteries, battery clips, and flashlight near you during sleep in case of electrical power outage.   10. Exit site care including dressing changes, monitoring for infection, and importance of keeping percutaneous lead stabilized at all times.    Extended the option to have one of our current patients to come to talk with them about living on support to assist with decision making. Plan for VAD pt to  visit with him tomorrow.   Reviewed pictures of VAD drive line, site care, dressing changes, and drive line stabilization including securement attachment device and abdominal binder. Discussed with pt and family that they will be required to purchase dressing supplies as long as patient has the VAD in place.   Reinforced need for 24 hour/7 day week caregivers; pt designated Jeffrey [name] (his wife) as caregiver. He will also need to abide by sternal  precautions with no lifting >10lbs, pushing, pulling and will need assistance with adapting to new life style with VAD equipment and care.   Intermacs patient survival statistics through March 2022 reviewed with patient and caregiver as follows:                                                The patient understands that from this discussion it does not mean that they will receive the device, but that depends on an extensive evaluation process. The patient is aware of the fact that if at anytime they want to stop the evaluation process they can.  All questions have been answered at this time and contact information was provided should they encounter any further questions.  They are both agreeable at this time to the evaluation process and will move forward.    [name] RN [place] Coordinator  Office: [phone]  24/7 Pager: [phone]

## 2021-05-12 NOTE — Progress Notes (Signed)
CSW met at bedside with patient and wife. CSW briefly discussed LVAD psychosocial assessment and answered questions. CSW scheduled LVAD assessment to be completed on Wednesday at 1:15 pm with wife and patient at bedside. Raquel Sarna, [place], [place]

## 2021-05-12 NOTE — TOC Progression Note (Signed)
Transition of Care (TOC) - Progression Note  Heart Failure   Patient Details  Name: Jeffrey [name] MRN: [id number] Date of Birth: 11-Jun-1976  Transition of Care [facility]) CM/SW [place], [place] Phone Number: 05/12/2021, 10:47 AM  Clinical Narrative:    CSW spoke with Mr. [name] at bedside regarding a disability referral. Mr. [name] reported he has applied for disability before but was denied. CSW obtained Mr. [name] signature for the [facility] application since his medical status has changed. CSW sent referral to the [facility]. CSW to enroll Mr. [name] in [facility] Transportation as he needs help getting to and from appointments.   CSW will continue to follow throughout discharge.   Expected Discharge Plan: Home/Self Care Barriers to Discharge: Continued Medical Work up  Expected Discharge Plan and Services Expected Discharge Plan: Home/Self Care In-house Referral: Clinical Social Work Discharge Planning Services: CM Consult   Living arrangements for the past 2 months: Single Family Home                                       Social Determinants of Health (SDOH) Interventions Food Insecurity Interventions: Assist with [employer] Application Financial Strain Interventions: Other (Comment) (Referral to [facility] for Disability and Food Stamps) Housing Interventions: Intervention Not Indicated Transportation Interventions: [employer]  Readmission Risk Interventions No flowsheet data found.  Islah Eve, MSW, [place] Heart Failure Social Worker

## 2021-05-12 NOTE — Progress Notes (Addendum)
Patient ID: Jeffrey [name], male   DOB: 24-Oct-1975, 45 y.o.   MRN: [id number]     Advanced Heart Failure Rounding Note  PCP-Cardiologist: Glori Bickers, MD   Subjective:    04/30/21 Echo: EF < 20%, severe LV dilation, moderate RV dysfunction 05/05/21 Bidil increased to 1 tab tid. Started on eliquis. Febrile to 101.2. Started on zyvox+ cefepime 05/05/21 Sputum Cx: rare staph aureus   8/3: Linezolid and cefepime switched to Ancef 8/5: Afib with RVR. Received amio bolus/load.  EP consulted  8/6 DC-CV   Remains in NSR. GDMT restarted.   Prednisone started yesterday for severe gout pain. Continues to have significant pain in both feet and right elbow.  No significant dyspnea.  Objective:   Weight Range: (!) 144.3 kg Body mass index is 48.37 kg/m.   Vital Signs:   Temp:  [98 F (36.7 C)-99.3 F (37.4 C)] 98.1 F (36.7 C) (08/08 0500) Pulse Rate:  [65-96] 96 (08/08 0700) Resp:  [20-22] 21 (08/08 0700) BP: (92-137)/(60-96) 137/87 (08/08 0700) SpO2:  [86 %-96 %] 86 % (08/08 0700) Weight:  [144.3 kg] 144.3 kg (08/08 0546) Last BM Date: 05/11/21  Weight change: Filed Weights   05/10/21 0500 05/11/21 0500 05/12/21 0546  Weight: (!) 139.3 kg (!) 138.8 kg (!) 144.3 kg    Intake/Output:   Intake/Output Summary (Last 24 hours) at 05/12/2021 1047 Last data filed at 05/12/2021 0932 Gross per 24 hour  Intake 1004.03 ml  Output 3200 ml  Net -2195.97 ml      Physical Exam    Unable to obtain good CVP waveforms General:  Sitting up in bed HEENT: normal Neck: supple. no JVD. Carotids 2+ bilat; no bruits. No lymphadenopathy or thryomegaly appreciated. Cor: PMI nondisplaced. Regular rate & rhythm. No rubs, gallops or murmurs. Lungs: clear Abdomen: soft, nontender, nondistended. No hepatosplenomegaly. No bruits or masses. Good bowel sounds. Extremities: no cyanosis, clubbing, rash, edema Neuro: alert & orientedx3, cranial nerves grossly intact. moves all 4 extremities w/o difficulty.  Affect pleasant   Telemetry   Sinus 90s, occasional PVCs (personally reviewed)  Labs    CBC No results for input(s): WBC, NEUTROABS, HGB, HCT, MCV, PLT in the last 72 hours. Basic Metabolic Panel Recent Labs    05/11/21 0422 05/12/21 0734  NA 139 141  K 3.9 3.7  CL 109 108  CO2 24 26  GLUCOSE 268* 100*  BUN 46* 42*  CREATININE 1.56* 1.64*  CALCIUM 9.5 9.9   Liver Function Tests No results for input(s): AST, ALT, ALKPHOS, BILITOT, PROT, ALBUMIN in the last 72 hours.  No results for input(s): LIPASE, AMYLASE in the last 72 hours. Cardiac Enzymes No results for input(s): CKTOTAL, CKMB, CKMBINDEX, TROPONINI in the last 72 hours.  BNP: BNP (last 3 results) Recent Labs    11/21/20 2222 04/29/21 1247  BNP 65.0 1,407.7*    ProBNP (last 3 results) No results for input(s): PROBNP in the last 8760 hours.   D-Dimer No results for input(s): DDIMER in the last 72 hours. Hemoglobin A1C No results for input(s): HGBA1C in the last 72 hours.  Fasting Lipid Panel No results for input(s): CHOL, HDL, LDLCALC, TRIG, CHOLHDL, LDLDIRECT in the last 72 hours.  Thyroid Function Tests No results for input(s): TSH, T4TOTAL, T3FREE, THYROIDAB in the last 72 hours.  Invalid input(s): FREET3   Other results:   Imaging    No results found.   Medications:     Scheduled Medications:  ALPRAZolam  0.5 mg Oral BID  apixaban  5 mg Oral BID   Chlorhexidine Gluconate Cloth  6 each Topical Daily   docusate sodium  100 mg Oral BID   empagliflozin  10 mg Oral Daily   feeding supplement  237 mL Oral BID BM   feeding supplement (PROSource TF)  90 mL Per Tube BID   insulin aspart  0-20 Units Subcutaneous Q4H   insulin aspart  10 Units Subcutaneous Q4H   insulin detemir  50 Units Subcutaneous BID   isosorbide-hydrALAZINE  1 tablet Oral TID   mouth rinse  15 mL Mouth Rinse BID   pantoprazole  40 mg Oral Daily   predniSONE  40 mg Oral Q breakfast   sacubitril-valsartan  1 tablet  Oral BID   sodium chloride flush  10-40 mL Intracatheter Q12H   sodium chloride flush  3 mL Intravenous Q12H   spironolactone  12.5 mg Oral Daily   torsemide  40 mg Oral Daily   traZODone  100 mg Oral QHS    Infusions:  sodium chloride Stopped (04/30/21 2254)   amiodarone 30 mg/hr (05/12/21 0338)    PRN Medications: sodium chloride, acetaminophen, nitroGLYCERIN, ondansetron (ZOFRAN) IV, sodium chloride flush, sodium chloride flush, sorbitol, traMADol  Assessment/Plan   1. Atrial fibrillation: Paroxysmal - Suspect AF with RVR drove initial CHF exacerbation. Emergent TEE-guided DCCV 7/27 due to inability to control HR - Recurrent AF on 8/5. s/p DC-CV on 05/10/21. Remains in NSR today - Seen by EP. Not a candidate for ablation due to body habitus  (could consider AVN ablation and CRT, if needed)  - Amiodarone initially stopped due to concern for toxicity, but resumed after reversion to rapid afib on 8/5  - Continue Eliquis.  - Keep K> 4 Mg > 2  2. 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  Echo in 9/21 with EF 20-25%.  Echo this admission with EF < 20%, severe LV dilation, moderate RV dysfunction.   - s/p S-ICD - 7/27: intubated with pulmonary edema and required addition of NE. Off NE.  Now extubated and off all pressors  - Unable to obtain CVP waveforms. On po torsemide 40 mg daily (20 daily at home). Does not appear overtly overloaded. -2.3L last 24 hrs. ? Weight up 8lb, new scale. - Dobutamine weaned off on 8/3  - Continue Bidil 1 tid - Entresto 24/26 bid - Increase spiro to 25 mg daily - Jardiance 10 mg daily - VAD work-up started.   3. Acute hypoxemic respiratory failure - Resp culture + for rare S. Aureus (MSSA)  - Became febrile on 8/1 and started on linezolid and cefepime. Cefepime switched to Ancef and Linezolid discontinued on 8/3. Ancef stopped 8/7 - Initial concern for amiodarone toxicity -- amio stopped and  steroids started, but steroids stopped the next morning on 8/4 -- per CCM team, more likely atelectasis driving hypoxia.  Deferred management to them  - Extubated on 8/4, initially to BiPAP and now on [place] - Incentive spirometry and flutter valve ordered   4. AKI on CKD stage 3  - Has solitary kidney.   - Suspect cardiorenal syndrome/low output with AF/RVR in setting of cardiomyopathy.  Creatinine peaked at 4.65, now 1.45 - Baseline creatinine is 1.5-1.7  - Urinary obstruction likely played a role. Urology placed foley catheter with improvement in UOP, but now has condom catheter in place. Foley removed due to concern for infection after fever developed - SCr stable 1.5-1.6   5. OSA -  CPAP after extubation    6. Hypernatremia  - resolved  7. Hypokalemia - K 3.7. Wil supp  8. Acute gout - Has h/o gout . Uric acid 10.4 - Prednisone x3 days, then resume home allopurinol  - Continues to have significant pain  9. DM2. Poorly controlled - resume Jardiance - continue SSI - HgbA1c 9.4 - Will get DM consult  Discharge planning: -PT/OT seeing. Unable to participate d/t severe gout pain.  -TOC CSW following.   FINCH, LINDSAY N, PA-C  05/12/2021, 10:47 AM  Advanced Heart Failure Team Pager [phone] (M-F; 7a - 5p)  Please contact [facility] for night-coverage after hours (5p -7a ) and weekends on [email or web address]  Patient seen and examined with the above-signed Advanced Practice Provider and/or Housestaff. I personally reviewed laboratory data, imaging studies and relevant notes. I independently examined the patient and formulated the important aspects of the plan. I have edited the note to reflect any of my changes or salient points. I have personally discussed the plan with the patient and/or family.  Remains in NSR. Denies CP or SOB. PICC not working so unable to get CVP or co-ox. Main complaint today is ongoing gout pain despite steroids.   General:  Sitting up in bed No resp  difficulty HEENT: normal Neck: supple. no JVD. Carotids 2+ bilat; no bruits. No lymphadenopathy or thryomegaly appreciated. Cor: PMI nondisplaced. Regular rate & rhythm. No rubs, gallops or murmurs. Lungs: clear Abdomen: obese soft, nontender, nondistended. No hepatosplenomegaly. No bruits or masses. Good bowel sounds. Extremities: no cyanosis, clubbing, rash, edema R ankle with gout Neuro: alert & orientedx3, cranial nerves grossly intact. moves all 4 extremities w/o difficulty. Affect pleasant  Remains in NSR. Volume status looks ok. Case d/w VAD team. Will continue VAD w/u. CTs today (non-contrast) and RHC tomorrow. I think we have some time prior to formally pursuing VAD but if develops recurrent AF this may shorten our time line.   Add colchcine for gout. Continue to titrate GDMT for HF.   Glori Bickers, MD  1:25 PM

## 2021-05-12 NOTE — Evaluation (Signed)
Occupational Therapy Evaluation Patient Details Name: Jeffrey [name] MRN: [id number] DOB: 11/28/1975 Today's Date: 05/12/2021    History of Present Illness 45yo admitted with nausea and weight gain 7/26. Pt with Afib with RVR leading to CHF exacerbation, cardiogenic shock and respiratory failure. 7/27 cardioversion with intubation, extubated 8/4. Hospital course complicated by gout in B feet and R elbow.PMhx: poorly controlled HTN, CKD stage 3a, PAF, DM2, morbid obesity, chronic systolic CHF, NICM s/p S-ICD, right renal cell CA s/p nephrectomy.   Clinical Impression   Pt was independent prior to admission. Presents with gout flare in feet and R elbow, limiting evaluation to EOB. Pt requires set up to total assist for ADL and moderate assistance to EOB and back to supine. Will follow acutely and determine optimal discharge environment after assessing OOB mobility.     Follow Up Recommendations  Other (comment) (TBD after able to tolerate OOB)    Equipment Recommendations  3 in 1 bedside commode;Wheelchair (measurements OT);Wheelchair cushion (measurements OT) (bariatric)    Recommendations for Other Services       Precautions / Restrictions Precautions Precautions: Fall Precaution Comments: watch sats, HR <170      Mobility Bed Mobility Overal bed mobility: Needs Assistance Bed Mobility: Rolling;Sit to Supine;Supine to Sit Rolling: Min assist   Supine to sit: Mod assist Sit to supine: Mod assist   General bed mobility comments: assist to raise trunk and for LEs back into bed    Transfers                      Balance     Sitting balance-Leahy Scale: Fair Sitting balance - Comments: with B UE support   Standing balance support: Bilateral upper extremity supported                               ADL either performed or assessed with clinical judgement   ADL Overall ADL's : Needs assistance/impaired Eating/Feeding: Set up;Bed level   Grooming:  Wash/dry hands;Wash/dry face;Oral care;Sitting;Supervision/safety;Set up   Upper Body Bathing: Maximal assistance;Sitting   Lower Body Bathing: Total assistance;Bed level   Upper Body Dressing : Minimal assistance;Sitting   Lower Body Dressing: Total assistance;Bed level       Toileting- Clothing Manipulation and Hygiene: Total assistance;Bed level (bed pan use)               Vision Patient Visual Report: No change from baseline       Perception     Praxis      Pertinent Vitals/Pain Pain Assessment: Faces Faces Pain Scale: Hurts whole lot Pain Location: B feet and R elbow Pain Descriptors / Indicators: Aching;Guarding Pain Intervention(s): Monitored during session;Premedicated before session;Repositioned     Hand Dominance Right   Extremity/Trunk Assessment Upper Extremity Assessment Upper Extremity Assessment: RUE deficits/detail;Generalized weakness RUE Deficits / Details: gout flare in R elbow   Lower Extremity Assessment Lower Extremity Assessment: Defer to PT evaluation   Cervical / Trunk Assessment Cervical / Trunk Assessment: Other exceptions Cervical / Trunk Exceptions: rounded shoulders, increased body habitus   Communication Communication Communication: No difficulties   Cognition Arousal/Alertness: Awake/alert Behavior During Therapy: WFL for tasks assessed/performed Overall Cognitive Status: Within Functional Limits for tasks assessed  General Comments       Exercises     Shoulder Instructions      Home Living Family/patient expects to be discharged to:: Private residence Living Arrangements: Spouse/significant other;Children Available Help at Discharge: Family;Available 24 hours/day Type of Home: House Home Access: Stairs to enter;Ramped entrance Entrance Stairs-Number of Steps: 5; ramp in back Entrance Stairs-Rails: Right Home Layout: One level     Bathroom Shower/Tub: Emergency planning/management officer: Standard     Home Equipment: None          Prior Functioning/Environment Level of Independence: Independent        Comments: stands to shower, wears slip on shoes, struggles with socks and washing feet        OT Problem List: Decreased strength;Decreased activity tolerance;Impaired balance (sitting and/or standing);Decreased coordination;Decreased knowledge of use of DME or AE;Impaired UE functional use;Pain      OT Treatment/Interventions: Self-care/ADL training;DME and/or AE instruction;Energy conservation;Balance training;Patient/family education;Therapeutic activities    OT Goals(Current goals can be found in the care plan section) Acute Rehab OT Goals Patient Stated Goal: return home to my kids (pt has 14 kids but a 62yo and 75 yo that live at home) OT Goal Formulation: With patient Time For Goal Achievement: 05/26/21 Potential to Achieve Goals: Good ADL Goals Pt Will Perform Grooming: with min assist;standing Pt Will Perform Lower Body Bathing: with min assist;with adaptive equipment;sit to/from stand Pt Will Perform Lower Body Dressing: with min assist;with adaptive equipment;sit to/from stand Pt Will Perform Toileting - Clothing Manipulation and hygiene: with min assist;sit to/from stand  OT Frequency: Min 2X/week   Barriers to D/C:            Co-evaluation              AM-PAC OT "6 Clicks" Daily Activity     Outcome Measure Help from another person eating meals?: None Help from another person taking care of personal grooming?: A Little Help from another person toileting, which includes using toliet, bedpan, or urinal?: Total Help from another person bathing (including washing, rinsing, drying)?: A Lot Help from another person to put on and taking off regular upper body clothing?: A Little Help from another person to put on and taking off regular lower body clothing?: Total 6 Click Score: 14   End of Session    Activity  Tolerance: Patient limited by pain Patient left: in bed;with call Allie/phone within reach  OT Visit Diagnosis: Pain;Muscle weakness (generalized) (M62.81)                Time: [id number] OT Time Calculation (min): 18 min Charges:  OT General Charges $OT Visit: 1 Visit OT Evaluation $OT Eval Moderate Complexity: 1 Mod  Nestor Lewandowsky, OTR/L Acute Rehabilitation Services Pager: [phone] Office: [phone]   Malka So 05/12/2021, 2:21 PM

## 2021-05-13 ENCOUNTER — Encounter (HOSPITAL_COMMUNITY)
Admission: EM | Disposition: A | Discharge status: Rehabilitation Facility | Payer: Self-pay | Source: Home / Self Care | Attending: Cardiology

## 2021-05-13 ENCOUNTER — Ambulatory Visit (INDEPENDENT_AMBULATORY_CARE_PROVIDER_SITE_OTHER): Payer: Medicaid Other | Admitting: Bariatrics

## 2021-05-13 ENCOUNTER — Inpatient Hospital Stay (HOSPITAL_COMMUNITY): Payer: Medicaid Other

## 2021-05-13 DIAGNOSIS — N1831 Chronic kidney disease, stage 3a: Secondary | ICD-10-CM | POA: Diagnosis not present

## 2021-05-13 DIAGNOSIS — R570 Cardiogenic shock: Secondary | ICD-10-CM | POA: Diagnosis not present

## 2021-05-13 DIAGNOSIS — J9601 Acute respiratory failure with hypoxia: Secondary | ICD-10-CM | POA: Diagnosis not present

## 2021-05-13 DIAGNOSIS — Z01810 Encounter for preprocedural cardiovascular examination: Secondary | ICD-10-CM | POA: Diagnosis not present

## 2021-05-13 DIAGNOSIS — N170 Acute kidney failure with tubular necrosis: Secondary | ICD-10-CM | POA: Diagnosis not present

## 2021-05-13 DIAGNOSIS — I480 Paroxysmal atrial fibrillation: Secondary | ICD-10-CM | POA: Diagnosis not present

## 2021-05-13 DIAGNOSIS — I5023 Acute on chronic systolic (congestive) heart failure: Secondary | ICD-10-CM | POA: Diagnosis not present

## 2021-05-13 HISTORY — PX: RIGHT HEART CATH: CATH118263

## 2021-05-13 LAB — CBC
HCT: 42 % (ref 39.0–52.0)
Hemoglobin: 13.6 g/dL (ref 13.0–17.0)
MCH: 26.9 pg (ref 26.0–34.0)
MCHC: 32.4 g/dL (ref 30.0–36.0)
MCV: 83.2 fL (ref 80.0–100.0)
Platelets: 418 10*3/uL — ABNORMAL HIGH (ref 150–400)
RBC: 5.05 MIL/uL (ref 4.22–5.81)
RDW: 17.2 % — ABNORMAL HIGH (ref 11.5–15.5)
WBC: 10.9 10*3/uL — ABNORMAL HIGH (ref 4.0–10.5)
nRBC: 0 % (ref 0.0–0.2)

## 2021-05-13 LAB — PULMONARY FUNCTION TEST
DL/VA % pred: 96 %
DL/VA: 4.38 ml/min/mmHg/L
DLCO cor % pred: 62 %
DLCO cor: 17.72 ml/min/mmHg
DLCO unc % pred: 60 %
DLCO unc: 17.2 ml/min/mmHg
FEF 25-75 Pre: 3 L/sec
FEF2575-%Pred-Pre: 90 %
FEV1-%Pred-Pre: 79 %
FEV1-Pre: 2.56 L
FEV1FVC-%Pred-Pre: 105 %
FEV6-%Pred-Pre: 77 %
FEV6-Pre: 3.01 L
FEV6FVC-%Pred-Pre: 102 %
FVC-%Pred-Pre: 75 %
FVC-Pre: 3.01 L
Pre FEV1/FVC ratio: 85 %
Pre FEV6/FVC Ratio: 100 %

## 2021-05-13 LAB — BASIC METABOLIC PANEL
Anion gap: 8 (ref 5–15)
BUN: 46 mg/dL — ABNORMAL HIGH (ref 6–20)
CO2: 23 mmol/L (ref 22–32)
Calcium: 9.9 mg/dL (ref 8.9–10.3)
Chloride: 104 mmol/L (ref 98–111)
Creatinine, Ser: 1.67 mg/dL — ABNORMAL HIGH (ref 0.61–1.24)
GFR, Estimated: 51 mL/min — ABNORMAL LOW (ref >60)
Glucose, Bld: 287 mg/dL — ABNORMAL HIGH (ref 70–99)
Potassium: 4.6 mmol/L (ref 3.5–5.1)
Sodium: 135 mmol/L (ref 135–145)

## 2021-05-13 LAB — GLUCOSE, CAPILLARY
Glucose-Capillary: 136 mg/dL — ABNORMAL HIGH (ref 70–99)
Glucose-Capillary: 169 mg/dL — ABNORMAL HIGH (ref 70–99)
Glucose-Capillary: 228 mg/dL — ABNORMAL HIGH (ref 70–99)
Glucose-Capillary: 245 mg/dL — ABNORMAL HIGH (ref 70–99)
Glucose-Capillary: 269 mg/dL — ABNORMAL HIGH (ref 70–99)
Glucose-Capillary: 341 mg/dL — ABNORMAL HIGH (ref 70–99)

## 2021-05-13 LAB — POCT I-STAT 7, (LYTES, BLD GAS, ICA,H+H)
Acid-Base Excess: 2 mmol/L (ref 0.0–2.0)
Bicarbonate: 27.2 mmol/L (ref 20.0–28.0)
Calcium, Ion: 1.32 mmol/L (ref 1.15–1.40)
HCT: 42 % (ref 39.0–52.0)
Hemoglobin: 14.3 g/dL (ref 13.0–17.0)
O2 Saturation: 65 %
Potassium: 4 mmol/L (ref 3.5–5.1)
Sodium: 143 mmol/L (ref 135–145)
TCO2: 28 mmol/L (ref 22–32)
pCO2 arterial: 41.6 mmHg (ref 32.0–48.0)
pH, Arterial: 7.424 (ref 7.350–7.450)
pO2, Arterial: 33 mmHg — CL (ref 83.0–108.0)

## 2021-05-13 LAB — POCT I-STAT EG7
Acid-Base Excess: 1 mmol/L (ref 0.0–2.0)
Bicarbonate: 26 mmol/L (ref 20.0–28.0)
Calcium, Ion: 1.26 mmol/L (ref 1.15–1.40)
HCT: 42 % (ref 39.0–52.0)
Hemoglobin: 14.3 g/dL (ref 13.0–17.0)
O2 Saturation: 67 %
Potassium: 3.8 mmol/L (ref 3.5–5.1)
Sodium: 144 mmol/L (ref 135–145)
TCO2: 27 mmol/L (ref 22–32)
pCO2, Ven: 40.1 mmHg — ABNORMAL LOW (ref 44.0–60.0)
pH, Ven: 7.42 (ref 7.250–7.430)
pO2, Ven: 34 mmHg (ref 32.0–45.0)

## 2021-05-13 LAB — COOXEMETRY PANEL
Carboxyhemoglobin: 0.9 % (ref 0.5–1.5)
Methemoglobin: 1.1 % (ref 0.0–1.5)
O2 Saturation: 78.1 %
Total hemoglobin: 13.7 g/dL (ref 12.0–16.0)

## 2021-05-13 LAB — DRVVT CONFIRM: dRVVT Confirm: 1.1 ratio (ref 0.8–1.2)

## 2021-05-13 LAB — DRVVT MIX: dRVVT Mix: 56.6 s — ABNORMAL HIGH (ref 0.0–40.4)

## 2021-05-13 LAB — LUPUS ANTICOAGULANT PANEL
DRVVT: 78.5 s — ABNORMAL HIGH (ref 0.0–47.0)
PTT Lupus Anticoagulant: 37.1 s (ref 0.0–51.9)

## 2021-05-13 LAB — HEPATITIS B SURFACE ANTIBODY, QUANTITATIVE: Hep B S AB Quant (Post): 3.4 m[IU]/mL — ABNORMAL LOW (ref >9.9)

## 2021-05-13 SURGERY — RIGHT HEART CATH
Anesthesia: LOCAL

## 2021-05-13 MED ORDER — SODIUM CHLORIDE 0.9% FLUSH
3.0000 mL | Freq: Two times a day (BID) | INTRAVENOUS | Status: DC
  Administered 2021-05-13 – 2021-05-19 (×7): 3 mL via INTRAVENOUS

## 2021-05-13 MED ORDER — SODIUM CHLORIDE 0.9% FLUSH: 3.0000 mL | INTRAVENOUS | Status: DC | PRN

## 2021-05-13 MED ORDER — ONDANSETRON HCL 4 MG/2ML IJ SOLN: 4.0000 mg | Freq: Four times a day (QID) | INTRAMUSCULAR | Status: DC | PRN

## 2021-05-13 MED ORDER — HEPARIN (PORCINE) IN NACL 1000-0.9 UT/500ML-% IV SOLN
INTRAVENOUS | Status: AC
  Filled 2021-05-13: qty 500

## 2021-05-13 MED ORDER — ALLOPURINOL 300 MG PO TABS: 300.0000 mg | ORAL_TABLET | Freq: Every day | ORAL | Status: DC

## 2021-05-13 MED ORDER — INSULIN ASPART 100 UNIT/ML IJ SOLN
0.0000 [IU] | Freq: Three times a day (TID) | INTRAMUSCULAR | Status: DC
  Administered 2021-05-13: 15 [IU] via SUBCUTANEOUS
  Administered 2021-05-14: 3 [IU] via SUBCUTANEOUS
  Administered 2021-05-14: 4 [IU] via SUBCUTANEOUS
  Administered 2021-05-14 – 2021-05-15 (×2): 3 [IU] via SUBCUTANEOUS
  Administered 2021-05-15 (×2): 4 [IU] via SUBCUTANEOUS
  Administered 2021-05-16: 3 [IU] via SUBCUTANEOUS
  Administered 2021-05-16: 6 [IU] via SUBCUTANEOUS
  Administered 2021-05-17: 4 [IU] via SUBCUTANEOUS
  Administered 2021-05-17 – 2021-05-18 (×4): 3 [IU] via SUBCUTANEOUS
  Administered 2021-05-19 (×2): 4 [IU] via SUBCUTANEOUS
  Administered 2021-05-20 – 2021-05-21 (×5): 3 [IU] via SUBCUTANEOUS
  Administered 2021-05-21: 4 [IU] via SUBCUTANEOUS
  Administered 2021-05-22: 3 [IU] via SUBCUTANEOUS
  Administered 2021-05-22: 4 [IU] via SUBCUTANEOUS

## 2021-05-13 MED ORDER — LIDOCAINE HCL (PF) 1 % IJ SOLN
INTRAMUSCULAR | Status: DC | PRN
  Administered 2021-05-13: 2 mL

## 2021-05-13 MED ORDER — LIDOCAINE HCL (PF) 1 % IJ SOLN
INTRAMUSCULAR | Status: AC
  Filled 2021-05-13: qty 30

## 2021-05-13 MED ORDER — HEPARIN (PORCINE) IN NACL 1000-0.9 UT/500ML-% IV SOLN
INTRAVENOUS | Status: DC | PRN
  Administered 2021-05-13: 500 mL

## 2021-05-13 MED ORDER — HYDRALAZINE HCL 20 MG/ML IJ SOLN: 10.0000 mg | INTRAMUSCULAR | Status: AC | PRN

## 2021-05-13 MED ORDER — INSULIN ASPART 100 UNIT/ML IJ SOLN
0.0000 [IU] | Freq: Every day | INTRAMUSCULAR | Status: DC
  Administered 2021-05-13: 2 [IU] via SUBCUTANEOUS

## 2021-05-13 MED ORDER — ALBUTEROL SULFATE (2.5 MG/3ML) 0.083% IN NEBU: 2.5000 mg | INHALATION_SOLUTION | Freq: Once | RESPIRATORY_TRACT | Status: DC

## 2021-05-13 MED ORDER — LABETALOL HCL 5 MG/ML IV SOLN: 10.0000 mg | INTRAVENOUS | Status: AC | PRN

## 2021-05-13 MED ORDER — ENSURE MAX PROTEIN PO LIQD
11.0000 [oz_av] | Freq: Two times a day (BID) | ORAL | Status: DC
  Administered 2021-05-13 – 2021-05-15 (×4): 11 [oz_av] via ORAL
  Filled 2021-05-13 (×16): qty 330

## 2021-05-13 MED ORDER — AMIODARONE HCL 200 MG PO TABS
200.0000 mg | ORAL_TABLET | Freq: Two times a day (BID) | ORAL | Status: DC
  Administered 2021-05-13 – 2021-05-22 (×19): 200 mg via ORAL
  Filled 2021-05-13 (×19): qty 1

## 2021-05-13 MED ORDER — SODIUM CHLORIDE 0.9 % IV SOLN: 250.0000 mL | INTRAVENOUS | Status: DC | PRN

## 2021-05-13 MED ORDER — INSULIN ASPART 100 UNIT/ML IJ SOLN: 0.0000 [IU] | Freq: Three times a day (TID) | INTRAMUSCULAR | Status: DC

## 2021-05-13 MED ORDER — INSULIN ASPART 100 UNIT/ML IJ SOLN
6.0000 [IU] | Freq: Three times a day (TID) | INTRAMUSCULAR | Status: DC
  Administered 2021-05-13 – 2021-05-22 (×26): 6 [IU] via SUBCUTANEOUS

## 2021-05-13 MED ORDER — ACETAMINOPHEN 325 MG PO TABS: 650.0000 mg | ORAL_TABLET | ORAL | Status: DC | PRN

## 2021-05-13 SURGICAL SUPPLY — 7 items
CATH BALLN WEDGE 5F 110CM (CATHETERS) ×1 IMPLANT
MAT PREVALON FULL STRYKER (MISCELLANEOUS) ×1 IMPLANT
PACK CARDIAC CATHETERIZATION (CUSTOM PROCEDURE TRAY) ×2 IMPLANT
SHEATH GLIDE SLENDER 4/5FR (SHEATH) ×1 IMPLANT
TRANSDUCER W/STOPCOCK (MISCELLANEOUS) ×2 IMPLANT
TUBING ART PRESS 72  MALE/FEM (TUBING) ×2
TUBING ART PRESS 72 MALE/FEM (TUBING) IMPLANT

## 2021-05-13 NOTE — Progress Notes (Signed)
RT note. Patient placed on home cpap for the night sat 94%. RT will continue to monitor

## 2021-05-13 NOTE — Progress Notes (Addendum)
Patient ID: Jeffrey [name], male   DOB: 1975-12-08, 45 y.o.   MRN: [id number]     Advanced Heart Failure Rounding Note  PCP-Cardiologist: Glori Bickers, MD   Subjective:    04/30/21 Echo: EF < 20%, severe LV dilation, moderate RV dysfunction 05/05/21 Bidil increased to 1 tab tid. Started on eliquis. Febrile to 101.2. Started on zyvox+ cefepime 05/05/21 Sputum Cx: rare staph aureus   8/3: Linezolid and cefepime switched to Ancef 8/5: Afib with RVR. Received amio bolus/load.  EP consulted  8/6 DC-CV   Remains in NSR. GDMT restarted.   Improvement in gout pain with colchicine and prednisone.  No dyspnea. Frustrated by physical deconditioning and weakness.  Objective:   Weight Range: (!) 141.2 kg Body mass index is 47.33 kg/m.   Vital Signs:   Temp:  [97.7 F (36.5 C)-98.4 F (36.9 C)] 98 F (36.7 C) (08/09 0804) Pulse Rate:  [56-85] 56 (08/09 0804) Resp:  [18-23] 22 (08/09 0804) BP: (94-118)/(61-87) 118/67 (08/09 0529) SpO2:  [92 %-96 %] 95 % (08/09 0804) FiO2 (%):  [2 %] 2 % (08/09 0804) Weight:  [141.2 kg] 141.2 kg (08/09 0529) Last BM Date: 05/12/21  Weight change: Filed Weights   05/11/21 0500 05/12/21 0546 05/13/21 0529  Weight: (!) 138.8 kg (!) 144.3 kg (!) 141.2 kg    Intake/Output:   Intake/Output Summary (Last 24 hours) at 05/13/2021 1016 Last data filed at 05/13/2021 1015 Gross per 24 hour  Intake 720 ml  Output 4375 ml  Net -3655 ml      Physical Exam    CVP 5 General:  Sitting up in bed HEENT: normal Neck: supple. no JVD, L IJ triple lumen. Carotids 2+ bilat; no bruits. No lymphadenopathy or thryomegaly appreciated. Cor: PMI nondisplaced. Regular rate & rhythm. No rubs, gallops or murmurs. Lungs: clear Abdomen: obese, soft, nontender, nondistended. No hepatosplenomegaly. No bruits or masses. Good bowel sounds. Extremities: no cyanosis, clubbing, rash, edema Neuro: alert & orientedx3, cranial nerves grossly intact. moves all 4 extremities w/o  difficulty. Affect pleasant   Telemetry   Sinus 70s (personally reviewed)  Labs    CBC Recent Labs    05/12/21 1313 05/13/21 0013  WBC  --  10.9*  HGB 13.6 13.6  HCT 40.0 42.0  MCV  --  83.2  PLT  --  [id number]*   Basic Metabolic Panel Recent Labs    05/12/21 0734 05/12/21 1313 05/13/21 0013  NA 141 141 135  K 3.7 3.6 4.6  CL 108  --  104  CO2 26  --  23  GLUCOSE 100*  --  287*  BUN 42*  --  46*  CREATININE 1.64*  --  1.67*  CALCIUM 9.9  --  9.9   Liver Function Tests No results for input(s): AST, ALT, ALKPHOS, BILITOT, PROT, ALBUMIN in the last 72 hours.  No results for input(s): LIPASE, AMYLASE in the last 72 hours. Cardiac Enzymes No results for input(s): CKTOTAL, CKMB, CKMBINDEX, TROPONINI in the last 72 hours.  BNP: BNP (last 3 results) Recent Labs    11/21/20 2222 04/29/21 1247  BNP 65.0 1,407.7*    ProBNP (last 3 results) No results for input(s): PROBNP in the last 8760 hours.   D-Dimer No results for input(s): DDIMER in the last 72 hours. Hemoglobin A1C No results for input(s): HGBA1C in the last 72 hours.  Fasting Lipid Panel Recent Labs    05/12/21 1303  CHOL 149  HDL 31*  LDLCALC 94  TRIG 120  CHOLHDL 4.8    Thyroid Function Tests No results for input(s): TSH, T4TOTAL, T3FREE, THYROIDAB in the last 72 hours.  Invalid input(s): FREET3   Other results:   Imaging    CT ABDOMEN PELVIS WO CONTRAST  Result Date: 05/12/2021 CLINICAL DATA:  Preoperative, scheduled for LVAD. EXAM: CT ABDOMEN AND PELVIS WITHOUT CONTRAST TECHNIQUE: Multidetector CT imaging of the abdomen and pelvis was performed following the standard protocol without IV contrast. COMPARISON:  None. FINDINGS: Lower chest: Left chest wall battery pack with lead coursing towards the anterior midline chest and above the margins of. Hepatobiliary: No visible focal liver lesion. Smooth surface contour. Normal gallbladder and biliary tree. Pancreas: No pancreatic ductal  dilatation or surrounding inflammatory changes. Spleen: Normal in size. No concerning splenic lesions. Small accessory splenule towards the splenic hilum. Adrenals/Urinary Tract: Normal adrenal glands. Surgical absence of the right kidney. Very mild left perinephric stranding is similar to comparison, nonspecific. No concerning renal mass. No urolithiasis or hydronephrosis. Urinary bladder is unremarkable for the degree of distention. Stomach/Bowel: Distal esophagus, stomach and duodenum are unremarkable. Normal duodenal sweep across the midline abdomen. There are some mid abdominal small bowel loops which are borderline air and fluid distended with fecalized material as well. Appearance could suggest some mild ileus or slowed transit with high-grade obstruction less favored. No colonic dilatation or wall thickening. Vascular/Lymphatic: No significant vascular findings are present. No enlarged abdominal or pelvic lymph nodes. Reproductive: Few typically benign punctate prostate calcifications. Prostate seminal vesicles are otherwise unremarkable. Other: No abdominopelvic free fluid or free gas. No bowel containing hernias. Mild soft tissue edema in the flanks and lateral abdominal wall. Musculoskeletal: Multilevel degenerative changes are present in the imaged portions of the spine. Features most pronounced in the lower thoracic spine. No acute osseous abnormality or suspicious osseous lesion. Musculature is normal and symmetric IMPRESSION: Few borderline distended loops of small bowel seen in the anterior midline abdomen, could reflect mild ileus or slowed intestinal transit with high-grade obstruction significantly less favored. Prior right nephrectomy. Battery pack along the low left chest wall with leads pacing anteriorly to the midline and above the margins of imaging. Electronically Signed   By: Lovena Le M.D.   On: 05/12/2021 21:27     Medications:     Scheduled Medications:  albuterol  2.5 mg  Nebulization Once   ALPRAZolam  0.5 mg Oral BID   amiodarone  200 mg Oral BID   apixaban  5 mg Oral BID   Chlorhexidine Gluconate Cloth  6 each Topical Daily   colchicine  0.6 mg Oral BID   docusate sodium  100 mg Oral BID   empagliflozin  10 mg Oral Daily   feeding supplement  237 mL Oral BID BM   insulin aspart  0-20 Units Subcutaneous Q4H   insulin detemir  50 Units Subcutaneous BID   isosorbide-hydrALAZINE  1 tablet Oral TID   mouth rinse  15 mL Mouth Rinse BID   pantoprazole  40 mg Oral Daily   sacubitril-valsartan  1 tablet Oral BID   sodium chloride flush  10-40 mL Intracatheter Q12H   sodium chloride flush  3 mL Intravenous Q12H   sodium chloride flush  3 mL Intravenous Q12H   spironolactone  25 mg Oral Daily   torsemide  40 mg Oral Daily   traZODone  100 mg Oral QHS    Infusions:  sodium chloride Stopped (04/30/21 2254)   sodium chloride     sodium chloride 10 mL/hr at 05/13/21  0617    PRN Medications: sodium chloride, sodium chloride, acetaminophen, nitroGLYCERIN, ondansetron (ZOFRAN) IV, sodium chloride flush, sodium chloride flush, sodium chloride flush, sorbitol, traMADol  Assessment/Plan   1. Atrial fibrillation: Paroxysmal - Suspect AF with RVR drove initial CHF exacerbation. Emergent TEE-guided DCCV 7/27 due to inability to control HR - Recurrent AF on 8/5. s/p DC-CV on 05/10/21. Remains in NSR today - Seen by EP. Not a candidate for ablation due to body habitus  (could consider AVN ablation and CRT, if needed)  - Amiodarone initially stopped due to concern for toxicity, but resumed after reversion to rapid afib on 8/5. Switch IV amio to poo 200 mg BID. - Continue Eliquis.  - Keep K> 4 Mg > 2  2. 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  Echo in 9/21 with EF 20-25%.  Echo this admission with EF < 20%, severe LV dilation, moderate RV dysfunction.   - s/p S-ICD - 7/27: intubated with pulmonary edema  and required addition of NE. Off NE.  Now extubated and off all pressors  - Dobutamine weaned off on 8/3, Co-ox 78% - On Torsemide 40 mg daily (20 mg daily prior home dose). ? Weight down 7 lb ON. Weight yesterday likely not accurate. Down 26 lb from admit. CVP 5. Hold torsemide. - Continue Bidil 1 tid - Entresto 24/26 bid - Continue spiro 25 mg daily - Jardiance 10 mg daily - No BB for now d/t recent shock - VAD work-up started. Going for RHC today.  3. Acute hypoxemic respiratory failure - Resp culture + for rare S. Aureus (MSSA)  - Became febrile on 8/1 and started on linezolid and cefepime. Cefepime switched to Ancef and Linezolid discontinued on 8/3. Ancef stopped 8/7 - Initial concern for amiodarone toxicity -- amio stopped and steroids started, but steroids stopped the next morning on 8/4 -- per CCM team, more likely atelectasis driving hypoxia.  Deferred management to them  - Extubated on 8/4, initially to BiPAP and now on [place]  4. AKI on CKD stage 3  - Has solitary kidney.   - Suspect cardiorenal syndrome/low output with AF/RVR in setting of cardiomyopathy.  Creatinine peaked at 4.65, now 1.45 - Baseline creatinine is 1.5-1.7, stable at 1.67 today - Urinary obstruction likely played a role. Urology placed foley catheter with improvement in UOP, but now has condom catheter in place. Foley removed due to concern for infection after fever developed  5. OSA - CPAP   6. Hypernatremia  - resolved  7. Hypokalemia - K 4.6  8. Acute gout - Has h/o gout . Uric acid 10.4 - Prednisone x3 days - Colchicine added with improvement - Transition back to allopurinol at discharge  9. DM2. Poorly controlled - resume Jardiance - continue SSI - Hgb A1c 9.4 - DM consulted, appreciate input  Discharge planning: -PT/OT seeing.  -TOC CSW following.   Jeffrey [name], Jeffrey N, PA-C  05/13/2021, 10:16 AM  Advanced Heart Failure Team Pager [phone] (M-F; 7a - 5p)  Please contact [facility]  for night-coverage after hours (5p -7a ) and weekends on [email or web address]  Patient seen and examined with the above-signed Advanced Practice Provider and/or Housestaff. I personally reviewed laboratory data, imaging studies and relevant notes. I independently examined the patient and formulated the important aspects of the plan. I have edited the note to reflect any of my changes or salient points. I have personally discussed the plan with the patient and/or family.  Remains  in NSR on IV amio. Co-ox looks good off inotropes. CVP 5-6.   Gout pain much improved with prednisone and colchcine.   Tearful over how weak he feels and trouble with PFTs  General:  Sitting on side of bed No resp difficulty HEENT: normal Neck: supple. no JVD. Carotids 2+ bilat; no bruits. No lymphadenopathy or thryomegaly appreciated. Cor: PMI nondisplaced. Regular rate & rhythm. No rubs, gallops or murmurs. Lungs: clear Abdomen: obese soft, nontender, nondistended. No hepatosplenomegaly. No bruits or masses. Good bowel sounds. Extremities: no cyanosis, clubbing, rash, edema Neuro: alert & orientedx3, cranial nerves grossly intact. moves all 4 extremities w/o difficulty. Affect pleasant  Remains in NSR. Can switch amio to po. Continue with VAD w/u. Plan RHC today. Continue to titrate GDMT as tolerated. PT/OT seeing.   Glori Bickers, MD  12:06 PM

## 2021-05-13 NOTE — Progress Notes (Signed)
Physical Therapy Treatment Patient Details Name: Jeffrey [name] MRN: [id number] DOB: December 05, 1975 Today's Date: 05/13/2021    History of Present Illness 45yo admitted with nausea and weight gain 7/26. Pt with Afib with RVR leading to CHF exacerbation, cardiogenic shock and respiratory failure. 7/27 cardioversion with intubation, extubated 8/4. 8/6 cardioversion. Hospital course complicated by gout in B feet and R elbow. PMhx: poorly controlled HTN, CKD stage 3a, PAF, DM2, morbid obesity, chronic systolic CHF, NICM s/p S-ICD, right renal cell CA s/p nephrectomy.    PT Comments    Pt was very motivated during session and demonstrated increased activity tolerance. Pt demonstrated increased bed mobility, requiring less assistance compared to previous sessions. Pt able to perform transfers with use from stedy. VSS with HR 52-70 measured with pulse ox. Pt's monitor malfunctioning during session and staff notified.   Follow Up Recommendations  LTACH;Supervision/Assistance - 24 hour     Equipment Recommendations  Other (comment) (TBD with progression)    Recommendations for Other Services       Precautions / Restrictions Precautions Precautions: Fall Precaution Comments: watch sats, HR <170 Restrictions Weight Bearing Restrictions: No    Mobility  Bed Mobility   Bed Mobility: Rolling;Sidelying to Sit;Supine to Sit Rolling: Min assist Sidelying to sit: Min assist;+2 for physical assistance Supine to sit: Min assist     General bed mobility comments: min A +1 with assist from pads and bed rails to roll to sidelying. Sidelying to sit min A +2 to guide LEs to EOB and elevate trunk with assist from bedrails. Min A for supine to sit to assist for LEs onto bed    Transfers Overall transfer level: Needs assistance Equipment used: 2 person hand held assist             General transfer comment: Performed sit to stand x4 with 2 person HHA with assist from pads and x10 with stedy, 2 person  HHA, and assist with pads  Ambulation/Gait             General Gait Details: Pt performed side stepping with 2 person HHA x4 trials: first 3 trials with seated rests in between, last trial after sit to stands with stedy   Stairs             Wheelchair Mobility    Modified Rankin (Stroke Patients Only)       Balance Overall balance assessment: Needs assistance Sitting-balance support: Feet unsupported;Bilateral upper extremity supported Sitting balance-Leahy Scale: Fair Sitting balance - Comments: BUE support to maintain upright position. Demonstrates increased cervical flexion in sitting. Verbal cues provided to lift head upright   Standing balance support: Bilateral upper extremity supported Standing balance-Leahy Scale: Fair Standing balance comment: Pt maintained standing static and dynamic balance with increased knee and trunk flexion with 2 person HHA, assist with pads, and use of stedy                            Cognition Arousal/Alertness: Awake/alert Behavior During Therapy: WFL for tasks assessed/performed Overall Cognitive Status: Within Functional Limits for tasks assessed                                        Exercises General Exercises - Upper Extremity Shoulder Flexion: AROM;Both;10 reps;Supine;Seated General Exercises - Lower Extremity Long Arc Quad: AROM;15 reps;Seated;Both Straight Leg Raises: AROM;Both;10 reps;Supine Hip  Flexion/Marching: AROM;Both;15 reps;Seated    General Comments General comments (skin integrity, edema, etc.): VSS with pulse ox. Unable to monitor on telemonitor, as it was malfunctioning during session      Pertinent Vitals/Pain Pain Score: 8  Pain Location: Bil feet, R elbow, R knee Pain Descriptors / Indicators: Constant Pain Intervention(s): Monitored during session;Repositioned;Limited activity within patient's tolerance    Home Living                      Prior Function             PT Goals (current goals can now be found in the care plan section) Acute Rehab PT Goals Patient Stated Goal: return home to my kids (pt has 14 kids but a 69yo and 39 yo that live at home) PT Goal Formulation: With patient Time For Goal Achievement: 05/23/21 Potential to Achieve Goals: Good Progress towards PT goals: Progressing toward goals    Frequency    Min 3X/week      PT Plan Current plan remains appropriate    Co-evaluation              AM-PAC PT "6 Clicks" Mobility   Outcome Measure  Help needed turning from your back to your side while in a flat bed without using bedrails?: A Little Help needed moving from lying on your back to sitting on the side of a flat bed without using bedrails?: A Lot Help needed moving to and from a bed to a chair (including a wheelchair)?: Total Help needed standing up from a chair using your arms (e.g., wheelchair or bedside chair)?: Total Help needed to walk in hospital room?: Total Help needed climbing 3-5 steps with a railing? : Total 6 Click Score: 9    End of Session   Activity Tolerance: Patient tolerated treatment well Patient left: with call Ion/phone within reach;in bed;with nursing/sitter in room Nurse Communication: Mobility status PT Visit Diagnosis: Other abnormalities of gait and mobility (R26.89);Difficulty in walking, not elsewhere classified (R26.2);Muscle weakness (generalized) (M62.81)     Time: [id number] PT Time Calculation (min) (ACUTE ONLY): 39 min  Charges:  $Therapeutic Exercise: 8-22 mins $Therapeutic Activity: 23-37 mins                     [name], SPT Acute Rehab: [phone] [id number]    [name] 05/13/2021, 12:12 PM

## 2021-05-13 NOTE — Progress Notes (Signed)
Palliative:  I met today with Maricus and he is planned to go to radiology soon. He says his wife will be present tomorrow and we discussed plan to meet tomorrow with himself and his wife. I called wife, [name], and she confirms plans to be at the hospital to meet with social worker tomorrow 8/10 1315 and agrees to meet with me after talking with LVAD social worker tomorrow.   No charge  Vinie Sill, NP Palliative Medicine Team Pager [phone] (Please see [email or web address] for schedule) Team Phone [phone]

## 2021-05-13 NOTE — Discharge Instructions (Addendum)

## 2021-05-13 NOTE — Progress Notes (Signed)
Nutrition Follow-up  DOCUMENTATION CODES:   Morbid obesity  INTERVENTION:   - Ensure Max po BID, each supplement provides 150 kcal and 30 grams of protein  - Encourage PO intake  NUTRITION DIAGNOSIS:   Inadequate oral intake related to acute illness as evidenced by NPO status.  Progressing, pt NPO at this time for cath lab but has been on Heart Healthy/Carb Modified diet with improving meal completions  GOAL:   Patient will meet greater than or equal to 90% of their needs  Progressing  MONITOR:   PO intake, Supplement acceptance, Labs, Weight trends, I & O's  REASON FOR ASSESSMENT:   Consult LVAD Eval  ASSESSMENT:   45 yo male admitted with decompensated heart failure in setting of afib, worsening respiratory failure requiring intubation. PMH includes CHF, CKD III, solitary kidney secondary to R renal cell carcinoma.  7/26 - admitted 7/27 - cardioversion, worsening respiratory status requiring intubation 8/03 - Cortrak placed 8/04 - extubated 8/05 - transitioned to nocturnal TF 8/06 - cardioversion 8/07 - Cortrak removed  Pt currently NPO for RHC today. Cortrak removed and nocturnal TF d/c on 8/07.  Pt is being worked up for a potential LVAD per HF team notes.  Attempted to speak with pt at bedside; however, pt in cath lab during attempts. Noted pt with improved PO intake since Cortrak removed. Pt's meal completions on 8/08 were 75%, 90%, and 75%.  Pt with Ensure Enlive ordered BID. Pt has been refusing >90% of Ensure supplements when offered. Will trial switching pt from Ensure Enlive to Ensure Max to provide additional protein and assess for tolerance.  Admit weight: 152.9 kg Current weight: 141.2 kg  Weight down a total of 11.7 kg since admit. Suspect weight loss related to diuresis. Pt continues to diurese.  Meal Completion: 40-100% x last 8 documented meals  Medications reviewed and include: colace, jardiance, Ensure Enlive BID, SSI q 4 hours, levemir  50 units BID, protonix, spironolactone, torsemide  Labs reviewed: BUN 46, creatinine 1.67, HDL 31 CBG's: 144-368 x 24 hours  UOP: 4250 ml x 24 hours I/O's: +727 ml since admit  NUTRITION - FOCUSED PHYSICAL EXAM:  Unable to complete. Pt in cath lab.  Diet Order:   Diet Order     None       EDUCATION NEEDS:   Not appropriate for education at this time  Skin:  Skin Assessment: Reviewed RN Assessment  Last BM:  05/12/21 medium type 6  Height:   Ht Readings from Last 1 Encounters:  05/07/21 '5\' 8"'$  (1.727 m)    Weight:   Wt Readings from Last 1 Encounters:  05/13/21 (!) 141.2 kg    BMI:  Body mass index is 47.33 kg/m.  Estimated Nutritional Needs:   Kcal:  2200-2400  Protein:  130-150 grams  Fluid:  >/= 2 L/day    Gustavus Bryant, MS, RD, LDN Inpatient Clinical Dietitian Please see AMiON for contact information.

## 2021-05-13 NOTE — Consult Note (Signed)
Consultation Note Date: 05/14/2021   Patient Name: Jeffrey [name]  DOB: [birth year]  MRN: [id number]  Age / Sex: 45 y.o., male  PCP: [name], FNP (Inactive) Referring Physician: Larey Dresser, MD  Reason for Consultation: VAD evaluation  HPI/Patient Profile: 45 y.o. male  with past medical history of hypertension, diastolic/systolic CHF EF 0000000, paroxysmal atrial fibrillation, NIDM s/p AICD, diabetes, CKD stage 3a, morbid obesity, history of renal cell carcinoma s/p nephrectomy admitted on 04/29/2021 with shortness of breath and heart racing afib RVR and decompensated heart failure.   Clinical Assessment and Goals of Care: PMT was consulted to discuss living will as patient is being evaluated for LVAD placement.   I reviewed medical records, assessed the patient and then meet at the patient's bedside with patient and patient's wife  to discuss diagnosis, prognosis, LVAD evaluation, and advanced care planning.   Concept of Palliative Care was introduced as specialized medical care for people and their families living with serious illness.  If focuses on providing relief from the symptoms and stress of a serious illness.  The goal is to improve quality of life for both the patient and the family.  Created space and opportunity for patient and wife to explore thoughts and feelings regarding current LVAD evaluation, procedure, recovery, and living life with an LVAD.   I explained the physical and psychosocial challenges of receiving, recovering, and living with an LVAD. We reviewed that there will be good and bad days but that holding on to the ultimate goal of living with a better quality of life for his wife, 48 year old, 1 year old, and 38 other children. He said multiple times that he is ready to proceed with LVAD. He is not sleeping well and just "ready" to get this done.  We reviewed that there is  an appropriate window of time when the LVAD placement, should he be an appropriate candidate, will be placed. It is a delicate balance of not too early if his heart is functioning well enough versus waiting too long and his body decompensating beyond being able to tolerate the LVAD surgery.   Patient expressed his concerns include opening his chest and recovering from such an intense surgery. He is most worried about the pain he might experience post-op. We reviewed that he will be in the hospital for an extended amount of time after his surgery where we can best manage his pain and other post-op symptoms. I assured him that the LVAD team is well versed in caring for patient's post op and that his pain and non-pain symptoms will be managed to the best of medicine's ability throughout his hospitalization as well as OP. We did discuss potential issue with pain, depression, anxiety, and insomnia as common side effects with LVAD and recovery. [name] did speak to his feeling of underlying depression and he is open to beginning an antidepressant now prior to proceeding with LVAD procedure.   His wife expressed that her main concern is the uncertainty of if or  when the patient would get an LVAD. She expressed she worries rather constantly about any and every little thing about him while they wait to see if he is an LVAD recipient.  Neither the patient nor the wife have discussed or completed ACP documents. We reviewed the importance of planning for the best case scenario but also putting measures in place in case the worst scenario occurs. We reviewed HCPOA/Living Will and copies provided for them to have ongoing discussions and complete.   Education offered today regarding advanced directives. I reviewed the details of a living will. I expressed the importance of naming a health care agent and a secondary health care agent in the event that he is not able to speak for himself or make his own healthcare decisions.     Questions and concerns addressed.  Patient encouraged to call with questions or concerns.     PMT will continue to support holistically.   Primary Decision Maker PATIENT  Code Status/Advance Care Planning: Full code  Recommendations: Encouraged to have "what if" discussions and complete HCPOA/Living Will Consider initiation of SSRI for underlying depression/anxiety (sertraline could be a good option for him).  No palliative concerns for proceeding with LVAD - patient motivated with good support system in place.   Review of Systems  Constitutional:  Positive for fatigue. Negative for appetite change, chills and fever.  HENT:  Negative for dental problem, ear pain and sore throat.   Eyes:  Negative for pain.  Respiratory:  Negative for cough and chest tightness.   Cardiovascular:  Negative for chest pain.  Gastrointestinal:  Negative for abdominal distention and abdominal pain.  Endocrine: Negative for cold intolerance and heat intolerance.  Genitourinary:  Negative for difficulty urinating.  Neurological:  Negative for light-headedness and numbness.  Psychiatric/Behavioral:  Negative for agitation and behavioral problems.        Endorses feeling depressed and anxious   Physical Exam Constitutional:      General: He is not in acute distress.    Appearance: He is not ill-appearing.  HENT:     Head: Normocephalic and atraumatic.     Mouth/Throat:     Mouth: Mucous membranes are moist.     Pharynx: Oropharynx is clear.  Cardiovascular:     Rate and Rhythm: Normal rate.     Comments: PVCs Pulmonary:     Effort: Pulmonary effort is normal.     Breath sounds: No decreased breath sounds or wheezing.  Chest:     Chest wall: No tenderness.  Abdominal:     Palpations: Abdomen is soft.  Musculoskeletal:        General: Normal range of motion.     Cervical back: Normal range of motion.  Skin:    General: Skin is warm and dry.  Neurological:     Mental Status: He is alert  and oriented to person, place, and time.  Psychiatric:        Behavior: Behavior normal.    Vital Signs: BP 113/79 (BP Location: Right Wrist)   Pulse 77   Temp (!) 96.6 F (35.9 C) (Axillary) Comment: patient just ate ice wanted axillary temp  Resp (!) 29   Ht '5\' 8"'$  (1.727 m)   Wt (!) 141.2 kg   SpO2 95%   BMI 47.33 kg/m  Pain Scale: 0-10 POSS *See Group Information*: 1-Acceptable,Awake and alert Pain Score: 8    SpO2: SpO2: 95 % O2 Device:SpO2: 95 % O2 Flow Rate: .O2 Flow Rate (L/min): 2 L/min  Time Total: 70 min  Greater than 50%  of this time was spent counseling and coordinating care related to the above assessment and plan.  Signed by: Vinie Sill, NP Palliative Medicine Team Pager # [phone] (M-F 8a-5p) Team Phone # [phone] (Nights/Weekends)

## 2021-05-13 NOTE — H&P (View-Only) (Signed)
Patient ID: Jeffrey [name], male   DOB: 06-Jul-1976, 45 y.o.   MRN: [id number]     Advanced Heart Failure Rounding Note  PCP-Cardiologist: Glori Bickers, MD   Subjective:    04/30/21 Echo: EF < 20%, severe LV dilation, moderate RV dysfunction 05/05/21 Bidil increased to 1 tab tid. Started on eliquis. Febrile to 101.2. Started on zyvox+ cefepime 05/05/21 Sputum Cx: rare staph aureus   8/3: Linezolid and cefepime switched to Ancef 8/5: Afib with RVR. Received amio bolus/load.  EP consulted  8/6 DC-CV   Remains in NSR. GDMT restarted.   Improvement in gout pain with colchicine and prednisone.  No dyspnea. Frustrated by physical deconditioning and weakness.  Objective:   Weight Range: (!) 141.2 kg Body mass index is 47.33 kg/m.   Vital Signs:   Temp:  [97.7 F (36.5 C)-98.4 F (36.9 C)] 98 F (36.7 C) (08/09 0804) Pulse Rate:  [56-85] 56 (08/09 0804) Resp:  [18-23] 22 (08/09 0804) BP: (94-118)/(61-87) 118/67 (08/09 0529) SpO2:  [92 %-96 %] 95 % (08/09 0804) FiO2 (%):  [2 %] 2 % (08/09 0804) Weight:  [141.2 kg] 141.2 kg (08/09 0529) Last BM Date: 05/12/21  Weight change: Filed Weights   05/11/21 0500 05/12/21 0546 05/13/21 0529  Weight: (!) 138.8 kg (!) 144.3 kg (!) 141.2 kg    Intake/Output:   Intake/Output Summary (Last 24 hours) at 05/13/2021 1016 Last data filed at 05/13/2021 1015 Gross per 24 hour  Intake 720 ml  Output 4375 ml  Net -3655 ml      Physical Exam    CVP 5 General:  Sitting up in bed HEENT: normal Neck: supple. no JVD, L IJ triple lumen. Carotids 2+ bilat; no bruits. No lymphadenopathy or thryomegaly appreciated. Cor: PMI nondisplaced. Regular rate & rhythm. No rubs, gallops or murmurs. Lungs: clear Abdomen: obese, soft, nontender, nondistended. No hepatosplenomegaly. No bruits or masses. Good bowel sounds. Extremities: no cyanosis, clubbing, rash, edema Neuro: alert & orientedx3, cranial nerves grossly intact. moves all 4 extremities w/o  difficulty. Affect pleasant   Telemetry   Sinus 70s (personally reviewed)  Labs    CBC Recent Labs    05/12/21 1313 05/13/21 0013  WBC  --  10.9*  HGB 13.6 13.6  HCT 40.0 42.0  MCV  --  83.2  PLT  --  [id number]*   Basic Metabolic Panel Recent Labs    05/12/21 0734 05/12/21 1313 05/13/21 0013  NA 141 141 135  K 3.7 3.6 4.6  CL 108  --  104  CO2 26  --  23  GLUCOSE 100*  --  287*  BUN 42*  --  46*  CREATININE 1.64*  --  1.67*  CALCIUM 9.9  --  9.9   Liver Function Tests No results for input(s): AST, ALT, ALKPHOS, BILITOT, PROT, ALBUMIN in the last 72 hours.  No results for input(s): LIPASE, AMYLASE in the last 72 hours. Cardiac Enzymes No results for input(s): CKTOTAL, CKMB, CKMBINDEX, TROPONINI in the last 72 hours.  BNP: BNP (last 3 results) Recent Labs    11/21/20 2222 04/29/21 1247  BNP 65.0 1,407.7*    ProBNP (last 3 results) No results for input(s): PROBNP in the last 8760 hours.   D-Dimer No results for input(s): DDIMER in the last 72 hours. Hemoglobin A1C No results for input(s): HGBA1C in the last 72 hours.  Fasting Lipid Panel Recent Labs    05/12/21 1303  CHOL 149  HDL 31*  LDLCALC 94  TRIG 120  CHOLHDL 4.8    Thyroid Function Tests No results for input(s): TSH, T4TOTAL, T3FREE, THYROIDAB in the last 72 hours.  Invalid input(s): FREET3   Other results:   Imaging    CT ABDOMEN PELVIS WO CONTRAST  Result Date: 05/12/2021 CLINICAL DATA:  Preoperative, scheduled for LVAD. EXAM: CT ABDOMEN AND PELVIS WITHOUT CONTRAST TECHNIQUE: Multidetector CT imaging of the abdomen and pelvis was performed following the standard protocol without IV contrast. COMPARISON:  None. FINDINGS: Lower chest: Left chest wall battery pack with lead coursing towards the anterior midline chest and above the margins of. Hepatobiliary: No visible focal liver lesion. Smooth surface contour. Normal gallbladder and biliary tree. Pancreas: No pancreatic ductal  dilatation or surrounding inflammatory changes. Spleen: Normal in size. No concerning splenic lesions. Small accessory splenule towards the splenic hilum. Adrenals/Urinary Tract: Normal adrenal glands. Surgical absence of the right kidney. Very mild left perinephric stranding is similar to comparison, nonspecific. No concerning renal mass. No urolithiasis or hydronephrosis. Urinary bladder is unremarkable for the degree of distention. Stomach/Bowel: Distal esophagus, stomach and duodenum are unremarkable. Normal duodenal sweep across the midline abdomen. There are some mid abdominal small bowel loops which are borderline air and fluid distended with fecalized material as well. Appearance could suggest some mild ileus or slowed transit with high-grade obstruction less favored. No colonic dilatation or wall thickening. Vascular/Lymphatic: No significant vascular findings are present. No enlarged abdominal or pelvic lymph nodes. Reproductive: Few typically benign punctate prostate calcifications. Prostate seminal vesicles are otherwise unremarkable. Other: No abdominopelvic free fluid or free gas. No bowel containing hernias. Mild soft tissue edema in the flanks and lateral abdominal wall. Musculoskeletal: Multilevel degenerative changes are present in the imaged portions of the spine. Features most pronounced in the lower thoracic spine. No acute osseous abnormality or suspicious osseous lesion. Musculature is normal and symmetric IMPRESSION: Few borderline distended loops of small bowel seen in the anterior midline abdomen, could reflect mild ileus or slowed intestinal transit with high-grade obstruction significantly less favored. Prior right nephrectomy. Battery pack along the low left chest wall with leads pacing anteriorly to the midline and above the margins of imaging. Electronically Signed   By: Lovena Le M.D.   On: 05/12/2021 21:27     Medications:     Scheduled Medications:  albuterol  2.5 mg  Nebulization Once   ALPRAZolam  0.5 mg Oral BID   amiodarone  200 mg Oral BID   apixaban  5 mg Oral BID   Chlorhexidine Gluconate Cloth  6 each Topical Daily   colchicine  0.6 mg Oral BID   docusate sodium  100 mg Oral BID   empagliflozin  10 mg Oral Daily   feeding supplement  237 mL Oral BID BM   insulin aspart  0-20 Units Subcutaneous Q4H   insulin detemir  50 Units Subcutaneous BID   isosorbide-hydrALAZINE  1 tablet Oral TID   mouth rinse  15 mL Mouth Rinse BID   pantoprazole  40 mg Oral Daily   sacubitril-valsartan  1 tablet Oral BID   sodium chloride flush  10-40 mL Intracatheter Q12H   sodium chloride flush  3 mL Intravenous Q12H   sodium chloride flush  3 mL Intravenous Q12H   spironolactone  25 mg Oral Daily   torsemide  40 mg Oral Daily   traZODone  100 mg Oral QHS    Infusions:  sodium chloride Stopped (04/30/21 2254)   sodium chloride     sodium chloride 10 mL/hr at 05/13/21  0617    PRN Medications: sodium chloride, sodium chloride, acetaminophen, nitroGLYCERIN, ondansetron (ZOFRAN) IV, sodium chloride flush, sodium chloride flush, sodium chloride flush, sorbitol, traMADol  Assessment/Plan   1. Atrial fibrillation: Paroxysmal - Suspect AF with RVR drove initial CHF exacerbation. Emergent TEE-guided DCCV 7/27 due to inability to control HR - Recurrent AF on 8/5. s/p DC-CV on 05/10/21. Remains in NSR today - Seen by EP. Not a candidate for ablation due to body habitus  (could consider AVN ablation and CRT, if needed)  - Amiodarone initially stopped due to concern for toxicity, but resumed after reversion to rapid afib on 8/5. Switch IV amio to poo 200 mg BID. - Continue Eliquis.  - Keep K> 4 Mg > 2  2. 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  Echo in 9/21 with EF 20-25%.  Echo this admission with EF < 20%, severe LV dilation, moderate RV dysfunction.   - s/p S-ICD - 7/27: intubated with pulmonary edema  and required addition of NE. Off NE.  Now extubated and off all pressors  - Dobutamine weaned off on 8/3, Co-ox 78% - On Torsemide 40 mg daily (20 mg daily prior home dose). ? Weight down 7 lb ON. Weight yesterday likely not accurate. Down 26 lb from admit. CVP 5. Hold torsemide. - Continue Bidil 1 tid - Entresto 24/26 bid - Continue spiro 25 mg daily - Jardiance 10 mg daily - No BB for now d/t recent shock - VAD work-up started. Going for RHC today.  3. Acute hypoxemic respiratory failure - Resp culture + for rare S. Aureus (MSSA)  - Became febrile on 8/1 and started on linezolid and cefepime. Cefepime switched to Ancef and Linezolid discontinued on 8/3. Ancef stopped 8/7 - Initial concern for amiodarone toxicity -- amio stopped and steroids started, but steroids stopped the next morning on 8/4 -- per CCM team, more likely atelectasis driving hypoxia.  Deferred management to them  - Extubated on 8/4, initially to BiPAP and now on Wallace  4. AKI on CKD stage 3  - Has solitary kidney.   - Suspect cardiorenal syndrome/low output with AF/RVR in setting of cardiomyopathy.  Creatinine peaked at 4.65, now 1.45 - Baseline creatinine is 1.5-1.7, stable at 1.67 today - Urinary obstruction likely played a role. Urology placed foley catheter with improvement in UOP, but now has condom catheter in place. Foley removed due to concern for infection after fever developed  5. OSA - CPAP   6. Hypernatremia  - resolved  7. Hypokalemia - K 4.6  8. Acute gout - Has h/o gout . Uric acid 10.4 - Prednisone x3 days - Colchicine added with improvement - Transition back to allopurinol at discharge  9. DM2. Poorly controlled - resume Jardiance - continue SSI - Hgb A1c 9.4 - DM consulted, appreciate input  Discharge planning: -PT/OT seeing.  -TOC CSW following.   FINCH, LINDSAY N, PA-C  05/13/2021, 10:16 AM  Advanced Heart Failure Team Pager [phone] (M-F; 7a - 5p)  Please contact [facility]  for night-coverage after hours (5p -7a ) and weekends on [email or web address]  Patient seen and examined with the above-signed Advanced Practice Provider and/or Housestaff. I personally reviewed laboratory data, imaging studies and relevant notes. I independently examined the patient and formulated the important aspects of the plan. I have edited the note to reflect any of my changes or salient points. I have personally discussed the plan with the patient and/or family.  Remains  in NSR on IV amio. Co-ox looks good off inotropes. CVP 5-6.   Gout pain much improved with prednisone and colchcine.   Tearful over how weak he feels and trouble with PFTs  General:  Sitting on side of bed No resp difficulty HEENT: normal Neck: supple. no JVD. Carotids 2+ bilat; no bruits. No lymphadenopathy or thryomegaly appreciated. Cor: PMI nondisplaced. Regular rate & rhythm. No rubs, gallops or murmurs. Lungs: clear Abdomen: obese soft, nontender, nondistended. No hepatosplenomegaly. No bruits or masses. Good bowel sounds. Extremities: no cyanosis, clubbing, rash, edema Neuro: alert & orientedx3, cranial nerves grossly intact. moves all 4 extremities w/o difficulty. Affect pleasant  Remains in NSR. Can switch amio to po. Continue with VAD w/u. Plan RHC today. Continue to titrate GDMT as tolerated. PT/OT seeing.   Glori Bickers, MD  12:06 PM

## 2021-05-13 NOTE — Progress Notes (Signed)
RT note.Patient placed on home cpap for the night, sat 93% and resting comfortable, RT will continue to monitor

## 2021-05-13 NOTE — Interval H&P Note (Signed)
History and Physical Interval Note:  05/13/2021 12:49 PM  Jeffrey [name]  has presented today for surgery, with the diagnosis of heart failure.  The various methods of treatment have been discussed with the patient and family. After consideration of risks, benefits and other options for treatment, the patient has consented to  Procedure(s): RIGHT HEART CATH (N/A) as a surgical intervention.  The patient's history has been reviewed, patient examined, no change in status, stable for surgery.  I have reviewed the patient's chart and labs.  Questions were answered to the patient's satisfaction.     Huey Scalia

## 2021-05-13 NOTE — Progress Notes (Signed)
Pre-VAD Dopplers and bilateral lower extremity venous duplex completed. Refer to "CV Proc" under chart review to view preliminary results.  05/13/2021 4:59 PM Kelby Aline., MHA, RVT, RDCS, RDMS

## 2021-05-13 NOTE — Consult Note (Signed)
   [facility] Inpatient Consult   05/13/2021  Jeffrey [name] 10-17-1975 [id number]  Managed Medicaid:  [employer]  Primary Care Provider:  [name], FNP Inactive noted    Patient is currently active with [facility] for chronic disease management services in the Managed Medicaid prior to admission.  Patient has been engaged by a [place] [employer] [MM] RN Care Management Coordinator.  Our community based plan of care has focused on disease management and community resource support.    Plan: Update on progress can be sent to the MM RN Care Management Coordinator.  Of note, [facility] Care Management services does not replace or interfere with any services that are needed or arranged by inpatient [facility] care management team.  For additional questions or referrals please contact:   Natividad Brood, RN BSN [facility] Liaison  [phone] business mobile phone Toll free office [phone]  Fax number: [phone] [email or web address]

## 2021-05-14 ENCOUNTER — Other Ambulatory Visit (HOSPITAL_COMMUNITY): Payer: Self-pay

## 2021-05-14 ENCOUNTER — Inpatient Hospital Stay (HOSPITAL_COMMUNITY): Payer: Medicaid Other

## 2021-05-14 ENCOUNTER — Encounter (HOSPITAL_COMMUNITY): Payer: Self-pay | Admitting: Internal Medicine

## 2021-05-14 DIAGNOSIS — I5021 Acute systolic (congestive) heart failure: Secondary | ICD-10-CM | POA: Diagnosis not present

## 2021-05-14 DIAGNOSIS — Z95811 Presence of heart assist device: Secondary | ICD-10-CM | POA: Diagnosis not present

## 2021-05-14 DIAGNOSIS — N1831 Chronic kidney disease, stage 3a: Secondary | ICD-10-CM | POA: Diagnosis not present

## 2021-05-14 DIAGNOSIS — N170 Acute kidney failure with tubular necrosis: Secondary | ICD-10-CM | POA: Diagnosis not present

## 2021-05-14 DIAGNOSIS — R570 Cardiogenic shock: Secondary | ICD-10-CM | POA: Diagnosis not present

## 2021-05-14 DIAGNOSIS — Z515 Encounter for palliative care: Secondary | ICD-10-CM | POA: Diagnosis not present

## 2021-05-14 DIAGNOSIS — N183 Chronic kidney disease, stage 3 unspecified: Secondary | ICD-10-CM | POA: Diagnosis not present

## 2021-05-14 DIAGNOSIS — Z7189 Other specified counseling: Secondary | ICD-10-CM | POA: Diagnosis not present

## 2021-05-14 DIAGNOSIS — J9601 Acute respiratory failure with hypoxia: Secondary | ICD-10-CM | POA: Diagnosis not present

## 2021-05-14 DIAGNOSIS — I4891 Unspecified atrial fibrillation: Secondary | ICD-10-CM | POA: Diagnosis not present

## 2021-05-14 LAB — CBC
HCT: 41.5 % (ref 39.0–52.0)
Hemoglobin: 13.6 g/dL (ref 13.0–17.0)
MCH: 27 pg (ref 26.0–34.0)
MCHC: 32.8 g/dL (ref 30.0–36.0)
MCV: 82.5 fL (ref 80.0–100.0)
Platelets: 443 10*3/uL — ABNORMAL HIGH (ref 150–400)
RBC: 5.03 MIL/uL (ref 4.22–5.81)
RDW: 16.8 % — ABNORMAL HIGH (ref 11.5–15.5)
WBC: 9.5 10*3/uL (ref 4.0–10.5)
nRBC: 0 % (ref 0.0–0.2)

## 2021-05-14 LAB — GLUCOSE, CAPILLARY
Glucose-Capillary: 144 mg/dL — ABNORMAL HIGH (ref 70–99)
Glucose-Capillary: 186 mg/dL — ABNORMAL HIGH (ref 70–99)
Glucose-Capillary: 133 mg/dL — ABNORMAL HIGH (ref 70–99)
Glucose-Capillary: 132 mg/dL — ABNORMAL HIGH (ref 70–99)

## 2021-05-14 LAB — BASIC METABOLIC PANEL
Anion gap: 11 (ref 5–15)
BUN: 51 mg/dL — ABNORMAL HIGH (ref 6–20)
CO2: 23 mmol/L (ref 22–32)
Calcium: 9.9 mg/dL (ref 8.9–10.3)
Chloride: 104 mmol/L (ref 98–111)
Creatinine, Ser: 1.76 mg/dL — ABNORMAL HIGH (ref 0.61–1.24)
GFR, Estimated: 48 mL/min — ABNORMAL LOW (ref >60)
Glucose, Bld: 151 mg/dL — ABNORMAL HIGH (ref 70–99)
Potassium: 3.9 mmol/L (ref 3.5–5.1)
Sodium: 138 mmol/L (ref 135–145)

## 2021-05-14 LAB — COOXEMETRY PANEL
Carboxyhemoglobin: 0.9 % (ref 0.5–1.5)
Methemoglobin: 0.9 % (ref 0.0–1.5)
O2 Saturation: 59.7 %
Total hemoglobin: 13.1 g/dL (ref 12.0–16.0)

## 2021-05-14 LAB — HIV ANTIBODY (ROUTINE TESTING W REFLEX): HIV Screen 4th Generation wRfx: NONREACTIVE

## 2021-05-14 MED ORDER — ALLOPURINOL 100 MG PO TABS
100.0000 mg | ORAL_TABLET | Freq: Every day | ORAL | Status: DC
  Administered 2021-05-14 – 2021-05-22 (×9): 100 mg via ORAL
  Filled 2021-05-14 (×9): qty 1

## 2021-05-14 NOTE — Plan of Care (Signed)

## 2021-05-14 NOTE — TOC Progression Note (Addendum)
Transition of Care (TOC) - Progression Note  Heart Failure   Patient Details  Name: Jeffrey [name] MRN: [id number] Date of Birth: 12/30/1975  Transition of Care (TOC) CM/SW Contact  Cortlin Polo, LCSWA Phone Number: 05/14/2021, 4:25 PM  Clinical Narrative:    CSW met with the patient's spouse, Jeffrey [name] and the outpatient Advanced HF CSW, Jeffrey [name] for an LVAD assessment and workup with [name]. Jeffrey [name] was in for a CT scan and arrived back to his room around 1:35pm to join the conversation and discussion regarding receiving an LVAD. CSW, Jeffrey [name] led the discussion with the patient and family about the LVAD and completing a biopsychosocial/needs assessment. CSW answered questions from Jeffrey [name] about Jeffrey [name]'s disability and Food Stamp application and CSW to follow up with the [facility]. CSW had to leave and Jeffrey [name] completed the LVAD workup.   2:31pm - CSW spoke with the [facility] regarding Jeffrey [name]'s disability and Food Stamp application and the [facility] reported to be familiar with Jeffrey [name] and will reach out to him next week regarding his application.   CSW will continue to follow throughout discharge.   Expected Discharge Plan: Home w Home Health Services Barriers to Discharge: Continued Medical Work up  Expected Discharge Plan and Services Expected Discharge Plan: Home w Home Health Services In-house Referral: Clinical Social Work Discharge Planning Services: CM Consult Post Acute Care Choice: Home Health Living arrangements for the past 2 months: Single Family Home                 DME Arranged: 3-N-1, Walker rolling with seat, Shower stool DME Agency: [employer]                   Social Determinants of Health (SDOH) Interventions Food Insecurity Interventions: Assist with SNAP Application Financial Strain Interventions: Other (Comment) (Referral to [facility] for Disability and Food Stamps) Housing Interventions: Intervention Not  Indicated Transportation Interventions: [facility]  Readmission Risk Interventions No flowsheet data found.  Cortlin Polo, MSW, LCSWA [phone] Heart Failure Social Worker  

## 2021-05-14 NOTE — Progress Notes (Addendum)
Patient ID: Jeffrey [name], male   DOB: 1976-02-12, 45 y.o.   MRN: [id number]     Advanced Heart Failure Rounding Note  PCP-Cardiologist: Glori Bickers, MD   Subjective:    04/30/21 Echo: EF < 20%, severe LV dilation, moderate RV dysfunction 05/05/21 Febrile to 101.2. Started on zyvox+ cefepime 05/05/21 Sputum Cx: rare staph aureus   8/3: Linezolid and cefepime switched to Ancef 8/5: Afib with RVR. Received amio bolus/load.  EP consulted  8/6 DC-CV  Co-ox 78% > 67% > 59.7%  Remains in NSR.   Gout pain continuing to improve. No shortness of breath. No CP. Reports some positional dizziness.   RHC, 05/13/21: RA = 6 RV = 29/3 PA = 35/12 (22) PCW = 16 Fick cardiac output/index = 5.5/2.3 PVR = 1.1 WU Ao sat = 98% PA sat = 65%, 67%    Objective:   Weight Range: (!) 138.3 kg Body mass index is 46.36 kg/m.   Vital Signs:   Temp:  [96.6 F (35.9 C)-98.4 F (36.9 C)] 98.4 F (36.9 C) (08/10 0330) Pulse Rate:  [56-98] 66 (08/10 0455) Resp:  [0-25] 25 (08/10 0455) BP: (91-135)/(69-88) 101/69 (08/10 0330) SpO2:  [90 %-96 %] 94 % (08/10 0455) FiO2 (%):  [2 %] 2 % (08/09 0804) Weight:  [138.3 kg] 138.3 kg (08/10 0455) Last BM Date: 05/13/21  Weight change: Filed Weights   05/12/21 0546 05/13/21 0529 05/14/21 0455  Weight: (!) 144.3 kg (!) 141.2 kg (!) 138.3 kg    Intake/Output:   Intake/Output Summary (Last 24 hours) at 05/14/2021 0657 Last data filed at 05/14/2021 [id number] Gross per 24 hour  Intake 546.59 ml  Output 2750 ml  Net -2203.41 ml      Physical Exam    CVP 5 General:  Lying at angle in bed HEENT: normal Neck: supple. no JVD, L IJ triple lumen. Carotids 2+ bilat; no bruits. No lymphadenopathy or thryomegaly appreciated. Cor: PMI nondisplaced. Regular rate & rhythm. No rubs, gallops or murmurs. Lungs: clear Abdomen: obese, soft, nontender, nondistended. No hepatosplenomegaly. No bruits or masses. Good bowel sounds. Extremities: no cyanosis, clubbing, rash,  edema Neuro: alert & orientedx3, cranial nerves grossly intact. moves all 4 extremities w/o difficulty. Affect pleasant   Telemetry   Sinus with ? Short runs of afib (difficult to interpret d/t artifact), occasional PVCs  Labs    CBC Recent Labs    05/13/21 0013 05/13/21 1322  WBC 10.9*  --   HGB 13.6 14.3  14.3  HCT 42.0 42.0  42.0  MCV 83.2  --   PLT 418*  --    Basic Metabolic Panel Recent Labs    05/13/21 0013 05/13/21 1322 05/14/21 0111  NA 135 144  143 138  K 4.6 3.8  4.0 3.9  CL 104  --  104  CO2 23  --  23  GLUCOSE 287*  --  151*  BUN 46*  --  51*  CREATININE 1.67*  --  1.76*  CALCIUM 9.9  --  9.9   Liver Function Tests No results for input(s): AST, ALT, ALKPHOS, BILITOT, PROT, ALBUMIN in the last 72 hours.  No results for input(s): LIPASE, AMYLASE in the last 72 hours. Cardiac Enzymes No results for input(s): CKTOTAL, CKMB, CKMBINDEX, TROPONINI in the last 72 hours.  BNP: BNP (last 3 results) Recent Labs    11/21/20 2222 04/29/21 1247  BNP 65.0 1,407.7*    ProBNP (last 3 results) No results for input(s): PROBNP in the last 8760  hours.   D-Dimer No results for input(s): DDIMER in the last 72 hours. Hemoglobin A1C No results for input(s): HGBA1C in the last 72 hours.  Fasting Lipid Panel Recent Labs    05/12/21 1303  CHOL 149  HDL 31*  LDLCALC 94  TRIG 120  CHOLHDL 4.8    Thyroid Function Tests No results for input(s): TSH, T4TOTAL, T3FREE, THYROIDAB in the last 72 hours.  Invalid input(s): FREET3   Other results:   Imaging    CARDIAC CATHETERIZATION  Result Date: 05/13/2021 Findings: RA = 6 RV = 29/3 PA = 35/12 (22) PCW = 16 Fick cardiac output/index = 5.5/2.3 PVR = 1.1 WU Ao sat = 98% PA sat = 65%, 67% Assessment: Well-compensated hemodynamics Plan/Discussion: Continue current therapy. Glori Bickers, MD 1:59 PM  VAS US DOPPLER PRE VAD  Result Date: 05/13/2021 PERIOPERATIVE VASCULAR EVALUATION Patient Name:  Jeffrey [name]  Date of Exam:   05/13/2021 Medical Rec #: [id number]     Accession #:    [id number] Date of Birth: 1976-09-04     Patient Gender: M Patient Age:   16 years Exam Location:  [facility] Procedure:      VAS US DOPPLER PRE VAD Referring Phys: Glori Bickers --------------------------------------------------------------------------------  Indications:      Pre-surgical evaluation. Risk Factors:     Hypertension, hyperlipidemia, Diabetes, current smoker. Limitations:      Bandaging Comparison Study: No prior study Performing Technologist: Maudry Mayhew MHA, RVT, RDCS, RDMS  Examination Guidelines: A complete evaluation includes B-mode imaging, spectral Doppler, color Doppler, and power Doppler as needed of all accessible portions of each vessel. Bilateral testing is considered an integral part of a complete examination. Limited examinations for reoccurring indications may be performed as noted.  Right Carotid Findings: +----------+--------+-------+--------+--------------------------------+--------+           PSV cm/sEDV    StenosisDescribe                        Comments                   cm/s                                                    +----------+--------+-------+--------+--------------------------------+--------+ CCA Prox  59      10                                                      +----------+--------+-------+--------+--------------------------------+--------+ CCA Distal80      18                                                      +----------+--------+-------+--------+--------------------------------+--------+ ICA Prox  36      11             focal, irregular and  heterogenous                             +----------+--------+-------+--------+--------------------------------+--------+ ICA Distal31      11                                                       +----------+--------+-------+--------+--------------------------------+--------+ ECA       106     14                                                      +----------+--------+-------+--------+--------------------------------+--------+ +----------+--------+-------+----------------+------------+           PSV cm/sEDV cmsDescribe        Arm Pressure +----------+--------+-------+----------------+------------+ Subclavian76             Multiphasic, WNL             +----------+--------+-------+----------------+------------+ +---------+--------+--+--------+-+---------+ VertebralPSV cm/s19EDV cm/s7Antegrade +---------+--------+--+--------+-+---------+ Left Carotid Findings: +----------+--------+--------+--------+--------+--------+           PSV cm/sEDV cm/sStenosisDescribeComments +----------+--------+--------+--------+--------+--------+ CCA Prox  74      18                               +----------+--------+--------+--------+--------+--------+ CCA Distal75                                       +----------+--------+--------+--------+--------+--------+ ICA Prox  56      15                               +----------+--------+--------+--------+--------+--------+ ICA Distal49      19                               +----------+--------+--------+--------+--------+--------+ ECA       67                                       +----------+--------+--------+--------+--------+--------+ +----------+--------+--------+------------+------------+ SubclavianPSV cm/sEDV cm/sDescribe    Arm Pressure +----------+--------+--------+------------+------------+                           Not assessed97           +----------+--------+--------+------------+------------+ +---------+--------+--+--------+-+---------+ VertebralPSV cm/s27EDV cm/s9Antegrade +---------+--------+--+--------+-+---------+  ABI Findings:  +--------+------------------+-----+---------+----------------------------------+ Right   Rt Pressure (mmHg)IndexWaveform Comment                            +--------+------------------+-----+---------+----------------------------------+ Brachial                       triphasicPressure not obtained due to  recent catheterization             +--------+------------------+-----+---------+----------------------------------+ PTA     112               1.15 triphasic                                   +--------+------------------+-----+---------+----------------------------------+ DP      111               1.14 triphasic                                   +--------+------------------+-----+---------+----------------------------------+ +--------+------------------+-----+---------+-------+ Left    Lt Pressure (mmHg)IndexWaveform Comment +--------+------------------+-----+---------+-------+ [id number]                     triphasic        +--------+------------------+-----+---------+-------+ PTA     110               1.13 triphasic        +--------+------------------+-----+---------+-------+ DP      104               1.07 triphasic        +--------+------------------+-----+---------+-------+ +-------+---------------+----------------+ ABI/TBIToday's ABI/TBIPrevious ABI/TBI +-------+---------------+----------------+ Right  1.15                            +-------+---------------+----------------+ Left   1.13                            +-------+---------------+----------------+  Summary: Right Carotid: The extracranial vessels were near-normal with only minimal wall                thickening or plaque. Left Carotid: The extracranial vessels were near-normal with only minimal wall               thickening or plaque. Vertebrals:  Bilateral vertebral arteries demonstrate antegrade flow. Subclavians: Left subclavian artery was  not visualized. Normal flow hemodynamics              were seen in the right subclavian artery.  *See table(s) above for measurements and observations. Right ABI: Resting right ankle-brachial index is within normal range. No evidence of significant right lower extremity arterial disease. Left ABI: Resting left ankle-brachial index is within normal range. No evidence of significant left lower extremity arterial disease.  Electronically signed by Ruta Hinds MD on 05/13/2021 at 5:22:27 PM.    Final    VAS [place] LOWER EXTREMITY VENOUS (DVT)  Result Date: 05/13/2021  Lower Venous DVT Study Patient Name:  Jeffrey [name]  Date of Exam:   05/13/2021 Medical Rec #: [id number]     Accession #:    [id number] Date of Birth: April 15, 1976     Patient Gender: M Patient Age:   24 years Exam Location:  [facility] Procedure:      VAS [place] LOWER EXTREMITY VENOUS (DVT) Referring Phys: Quillian Quince Siris Hoos --------------------------------------------------------------------------------  Indications: Pre-op.  Comparison Study: No prior study Performing Technologist: Maudry Mayhew MHA, RDMS, RVT, RDCS  Examination Guidelines: A complete evaluation includes B-mode imaging, spectral Doppler, color Doppler, and power Doppler as needed of all accessible portions of each vessel. Bilateral testing is considered an integral part of a complete examination. Limited examinations for  reoccurring indications may be performed as noted. The reflux portion of the exam is performed with the patient in reverse Trendelenburg.  +---------+---------------+---------+-----------+----------+--------------+ RIGHT    CompressibilityPhasicitySpontaneityPropertiesThrombus Aging +---------+---------------+---------+-----------+----------+--------------+ CFV      Full           Yes      Yes                                 +---------+---------------+---------+-----------+----------+--------------+ SFJ      Full                                                         +---------+---------------+---------+-----------+----------+--------------+ FV Prox  Full                                                        +---------+---------------+---------+-----------+----------+--------------+ FV Mid   Full                                                        +---------+---------------+---------+-----------+----------+--------------+ FV DistalFull                                                        +---------+---------------+---------+-----------+----------+--------------+ PFV      Full                                                        +---------+---------------+---------+-----------+----------+--------------+ POP      Full           Yes      Yes                                 +---------+---------------+---------+-----------+----------+--------------+ PTV      Full                                                        +---------+---------------+---------+-----------+----------+--------------+ PERO     Full                                                        +---------+---------------+---------+-----------+----------+--------------+   +---------+---------------+---------+-----------+----------+--------------+ LEFT     CompressibilityPhasicitySpontaneityPropertiesThrombus Aging +---------+---------------+---------+-----------+----------+--------------+ CFV      Full  Yes      Yes                                 +---------+---------------+---------+-----------+----------+--------------+ SFJ      Full                                                        +---------+---------------+---------+-----------+----------+--------------+ FV Prox  Full                                                        +---------+---------------+---------+-----------+----------+--------------+ FV Mid   Full                                                         +---------+---------------+---------+-----------+----------+--------------+ FV DistalFull                                                        +---------+---------------+---------+-----------+----------+--------------+ PFV      Full                                                        +---------+---------------+---------+-----------+----------+--------------+ POP      Full           Yes      Yes                                 +---------+---------------+---------+-----------+----------+--------------+ PTV      Full                                                        +---------+---------------+---------+-----------+----------+--------------+   Left Technical Findings: Not visualized segments include peroneal veins.   Summary: RIGHT: - There is no evidence of deep vein thrombosis in the lower extremity.  - No cystic structure found in the popliteal fossa.  LEFT: - There is no evidence of deep vein thrombosis in the lower extremity. However, portions of this examination were limited- see technologist comments above.  - No cystic structure found in the popliteal fossa.  *See table(s) above for measurements and observations. Electronically signed by Ruta Hinds MD on 05/13/2021 at 5:22:06 PM.    Final      Medications:     Scheduled Medications:  albuterol  2.5 mg Nebulization Once   ALPRAZolam  0.5 mg Oral BID   amiodarone  200 mg Oral BID  apixaban  5 mg Oral BID   Chlorhexidine Gluconate Cloth  6 each Topical Daily   colchicine  0.6 mg Oral BID   docusate sodium  100 mg Oral BID   empagliflozin  10 mg Oral Daily   insulin aspart  0-20 Units Subcutaneous TID WC   insulin aspart  0-5 Units Subcutaneous QHS   insulin aspart  6 Units Subcutaneous TID WC   insulin detemir  50 Units Subcutaneous BID   isosorbide-hydrALAZINE  1 tablet Oral TID   mouth rinse  15 mL Mouth Rinse BID   pantoprazole  40 mg Oral Daily   Ensure Max Protein  11 oz Oral BID    sacubitril-valsartan  1 tablet Oral BID   sodium chloride flush  10-40 mL Intracatheter Q12H   sodium chloride flush  3 mL Intravenous Q12H   sodium chloride flush  3 mL Intravenous Q12H   spironolactone  25 mg Oral Daily   traZODone  100 mg Oral QHS    Infusions:  sodium chloride Stopped (04/30/21 2254)   sodium chloride      PRN Medications: sodium chloride, sodium chloride, acetaminophen, acetaminophen, nitroGLYCERIN, ondansetron (ZOFRAN) IV, ondansetron (ZOFRAN) IV, sodium chloride flush, sodium chloride flush, sodium chloride flush, sorbitol, traMADol  Assessment/Plan   1. Atrial fibrillation: Paroxysmal - Suspect AF with RVR drove initial CHF exacerbation. Emergent TEE-guided DCCV 7/27 due to inability to control HR - Recurrent AF on 8/5. s/p DC-CV on 05/10/21. Remains in NSR this am, possible short runs of afib noted on tele. - Seen by EP. Not a candidate for ablation due to body habitus  (could consider AVN ablation and CRT, if needed)  - Amiodarone initially stopped due to concern for toxicity, but resumed after reversion to rapid afib on 8/5. IV amio off, now on po 200 mg BID - Continue Eliquis.  - Keep K> 4 Mg > 2  2. 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  Echo in 9/21 with EF 20-25%.  Echo this admission with EF < 20%, severe LV dilation, moderate RV dysfunction.   - s/p S-ICD - 7/27: intubated with pulmonary edema and required addition of NE. Off NE.  Now extubated and off all pressors  - Dobutamine weaned off on 8/3, Co-ox 78% > 65% > 59.7% - RHC 08/09 with PCW 16, RA 6, Fick CI 2.3 - Holding Torsemide. Weight 304 lb, down 33 lb from admit. CVP 5. Plan to resume Torsemide at D/C. - Continue Bidil 1 tid - Entresto 24/26 bid - Continue spiro 25 mg daily - Jardiance 10 mg daily - No BB for now d/t recent shock - Will not titrate meds any further today d/t orthostatic dizziness. BP low 100s this am. - VAD  work-up started. Meeting with LVAD social worker and palliative care team this afternoon. Maxillofacial  CT w/o contrast this afternoon.  3. Acute hypoxemic respiratory failure - Resp culture + for rare S. Aureus (MSSA)  - Became febrile on 8/1 and started on linezolid and cefepime. Cefepime switched to Ancef and Linezolid discontinued on 8/3. Ancef stopped 8/7 - Initial concern for amiodarone toxicity -- amio stopped and steroids started, but steroids stopped the next morning on 8/4 -- per CCM team, more likely atelectasis driving hypoxia.  Deferred management to them  - Extubated on 8/4, initially to BiPAP and now on [place]  4. AKI on CKD stage 3  - Has solitary kidney.   - Suspect cardiorenal syndrome/low  output with AF/RVR in setting of cardiomyopathy.   - Baseline creatinine is 1.5-1.7, Creatinine peaked at 4.65. 1.76 today - Urinary obstruction likely played a role. Urology placed foley catheter with improvement in UOP. Foley removed due to concern for infection after fever developed  5. OSA - CPAP   6. Hypernatremia  - resolved  7. Hypokalemia - K 3.9  8. Acute gout - Has h/o gout . Uric acid 10.4 - Prednisone x3 days - Colchicine added with improvement - Add back allopurinol today  9. DM2. Poorly controlled - resume Jardiance - continue SSI - Hgb A1c 9.4 - DM consulted, appreciate input  10. Leukocytosis -WBC 8.5 > 10.9 > 9.5 -? If related to prednisone use.  - AF  Discharge planning:  Hopefully discharge later today or tomorrow pending recommendations from PT/OT. Will likely require HH or CIR. TOC CSW following.  Medications to [facility]   F/u in HF clinic scheduled    [facility], [place]  05/14/2021, 6:57 AM  Advanced Heart Failure Team Pager [phone] (M-F; 7a - 5p)  Please contact [facility] for night-coverage after hours (5p -7a ) and weekends on [email or web address]  Patient seen and examined with the above-signed Advanced Practice  Provider and/or Housestaff. I personally reviewed laboratory data, imaging studies and relevant notes. I independently examined the patient and formulated the important aspects of the plan. I have edited the note to reflect any of my changes or salient points. I have personally discussed the plan with the patient and/or family.  Feels ok today. Still has not been out of bed. Feels weak. Denies SOB, orthopnea or PND. Remains in NSR. CO-ox 60%  General:  Weak appearing. No resp difficulty HEENT: normal Neck: supple. no JVD. Carotids 2+ bilat; no bruits. No lymphadenopathy or thryomegaly appreciated. Cor: PMI nondisplaced. Regular rate & rhythm. No rubs, gallops or murmurs. Lungs: clear Abdomen: obese  soft, nontender, nondistended. No hepatosplenomegaly. No bruits or masses. Good bowel sounds. Extremities: no cyanosis, clubbing, rash, edema Neuro: alert & orientedx3, cranial nerves grossly intact. moves all 4 extremities w/o difficulty. Affect pleasant  Co-ox stable off milrinone. CVP ok. Remains weak. Stone numbers from yesterday reviewed. Has not been able to get out of bed. Will have PT/OT see today to help determine if he is safe to go home or if he would benefit from rehab.   Long talk about how VAD workup will proceed and timing of possible advanced therapies.   Glori Bickers, MD  5:15 PM

## 2021-05-14 NOTE — Discharge Summary (Addendum)
Advanced Heart Failure Team  Discharge Summary   Patient ID: Jeffrey [name] MRN: [id number], DOB/AGE: August 31, 1976 45 y.o. Admit date: 04/29/2021 D/C date:     05/22/2021   Primary Discharge Diagnoses:  Acute on chronic systolic CHF Cardiogenic shock Atrial fibrillation with RVR Acute hypoxemic respiratory failure AKI on CKD stage IIIa OSA Hypernatremia Hypokalemia Acute gout Type II DM Depression Physical deconditioning   Hospital Course: Jeffrey [name] is a 45 y.o. male with a history of  poorly controlled HTN, R renal cell carcinoma s/p nephrectomy, CKD IIIa, DM2, OSA, gout, morbid obesity, chronic systolic HF due to NICM (EF 20-25%), s/p [employer] ICD in 2016 (S-ICD gen change 2021), paroxysmal atrial fibrillation.   Hospitalized 2/17-2/22/22 for hypotension  & dizziness. Found to have AKI, likely 2/2 to recent diarrhea, diuretic use and poor po intake. Gently hydrated with IVF, Entresto and torsemide decreased, and BiDil stopped. Discharge weight 323 lbs.   Admitted on 07/26 with acute on chronic systolic CHF/cardiogenic shock and AF with RVR. He was placed on amio gtt and underwent cardioversion. Had recurrent AF on 08/05 and underwent cardioversion again on 08/06. Required NE and dobutamine, which were eventually weaned off. Course complicated by acute respiratory failure with hypoxia requiring mechanical ventilation. Seen by CCM and hypoxia felt to be due to atelectasis. Diuresed with lasix gtt with weight loss of 30 lb during stay. Attempted to add back torsemide but developed hypotension and later worsening renal function, so it was discontinued. He was initiated on GDMT (limited by hypotension) and VAD workup initiated.  Creatinine peaked at 4.65 improved to 1.58 which is his baseline. On day of discharge creatinine up to 1.98, diuretic stopped as previously mentioned. Also treated for acute gout flare with prednisone and colchicine. Sertraline initiated for management of  depression.   He was evaluated by PT/OT. Patient remains quite weak and requires assistance with ambulation. Requires breaks after walking about 75 feet. CIR arranged at discharge. Goal is for him to improve enough to get ready for VAD.   Hospital Course By Problem: 1. 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  Echo in 9/21 with EF 20-25%.  Echo this admission with EF < 20%, severe LV dilation, moderate RV dysfunction.   - s/p S-ICD - 7/27: intubated with pulmonary edema and required addition of NE.  - Dobutamine weaned off on 8/3 - Diuresed with IV lasix then po torsemide added. Torsemide stopped on 08/18 d/t worsening renal function. Creatinine 1.77 > 1.58 > 1.96. Weight down 37 # from admit. Compensated on day of D/C to CIR. - Bidil stopped 8/12 due to hypotension - Continue losartan 25 qhs  (switched from Entresto to losartan on 8/16 due to low BPs) - Continue spiro 25 mg daily - Continue jardiance 10 mg daily - No BB for now d/t recent shock - VAD work-up started.    2 Atrial fibrillation: Paroxysmal - Suspect AF with RVR drove initial CHF exacerbation. Emergent TEE-guided DCCV 7/27 due to inability to control HR - Recurrent AF on 8/5. s/p DC-CV on 05/10/21. Remains in nSR  - Seen by EP. Not a candidate for ablation due to body habitus  (could consider AVN ablation and CRT, if needed)  - Amiodarone initially stopped due to concern for toxicity, but resumed after reversion to rapid afib on 8/5. IV amio off, now on po 200 mg BID - Continue Eliquis.  - Keep K> 4 Mg > 2.  3. Acute hypoxemic respiratory failure - Resp culture + for rare S. Aureus (MSSA)  - Treated with linezolid and cefepime. Cefepime switched to Ancef and Linezolid discontinued on 8/3. Ancef stopped 8/7 - Initial concern for amiodarone toxicity -- amio stopped and steroids started, but steroids stopped the next morning on 8/4 -- per CCM team, more likely  atelectasis driving hypoxia.  Deferred management to them. CT chest not c/w amio toxicity despite ESR 128 - Extubated on 8/4, initially to BiPAP and now stable on RA    4. AKI on CKD stage 3  - Has solitary kidney.   - Suspect cardiorenal syndrome/low output with AF/RVR in setting of cardiomyopathy.   - Baseline creatinine is 1.5-1.7, Creatinine peaked at 4.65. 1.60 -> 1.77 -> 1.96. Diuresed well yesterday and weight down 9 lb. Stopped torsemide at D/C to CIR. - Urinary obstruction likely played a role. Urology placed foley catheter with improvement in UOP. No residual retention. - Avoid hypotension. Off bidil. BP better after switching Entresto to Losartan.   5. OSA - CPAP    6. Hypokalemia - K 3.4. Replace. - Continue close monitoring.   7. Acute gout - Received prednisone and colchicine -> resolved - Continue allopurinol   8. DM2. Poorly controlled - Jardiance - continue SSI - Hgb A1c 9.4 - DM consulted, appreciate input   9. Debility - HF much improved. Co-ox stable off inotropes. Remains in NSR. Main issue is severe debility.  - Remains quite weak and unable to ambulate independently. He is quite motivated to improve and agrees to CIR. It will expedite our chances of getting him ready for VAD in a reasonable amount of time.      DISPOSITION: Discharge to CIR today.  VAD workup initiated   Discharge Weight Range: 337 - 309 lb Discharge Vitals: Blood pressure 94/74, pulse 92, temperature 98.7 F (37.1 C), temperature source Oral, resp. rate 19, height '5\' 8"'  (1.727 m), weight (!) 136.3 kg, SpO2 96 %.  Labs: Lab Results  Component Value Date   WBC 6.0 05/20/2021   HGB 14.5 05/20/2021   HCT 44.0 05/20/2021   MCV 84.1 05/20/2021   PLT 465 (H) 05/20/2021    Recent Labs  Lab 05/22/21 0355  NA 135  K 3.4*  CL 100  CO2 23  BUN 31*  CREATININE 1.96*  CALCIUM 9.6  GLUCOSE 142*   Lab Results  Component Value Date   CHOL 149 05/12/2021   HDL 31 (L) 05/12/2021    LDLCALC 94 05/12/2021   TRIG 120 05/12/2021   BNP (last 3 results) Recent Labs    11/21/20 2222 04/29/21 1247  BNP 65.0 1,407.7*    ProBNP (last 3 results) No results for input(s): PROBNP in the last 8760 hours.   Diagnostic Studies/Procedures   RHC, 05/13/2021: Findings: RA = 6 RV = 29/3 PA = 35/12 (22) PCW = 16 Fick cardiac output/index = 5.5/2.3 PVR = 1.1 WU Ao sat = 98% PA sat = 65%, 67%   Assessment: Well-compensated hemodynamics    Echo, 04/30/2021: IMPRESSIONS  1. Left ventricular ejection fraction, by estimation, is <20%. The left  ventricle has severely decreased function. The left ventricle demonstrates  global hypokinesis. The left ventricular internal cavity size was severely  dilated. Left ventricular  diastolic parameters are consistent with Grade III diastolic dysfunction  (restrictive). Elevated left atrial pressure.   2. Right ventricular systolic function is moderately reduced. The right  ventricular size is normal. There is mildly elevated pulmonary artery  systolic pressure. The estimated right ventricular systolic pressure is  90.3 mmHg.   3. Left atrial size was severely dilated.   4. The mitral valve is normal in structure. Trivial mitral valve  regurgitation.   5. The aortic valve is tricuspid. Aortic valve regurgitation is not  visualized. No aortic stenosis is present.   6. The inferior vena cava is dilated in size with <50% respiratory  variability, suggesting right atrial pressure of 15 mmHg.    Discharge Medications   Allergies as of 05/22/2021       Reactions   Aspirin Shortness Of Breath, Itching, Rash    Burning sensation (Patient reports he tolerates other NSAIDS)    Bee Venom Hives, Swelling   Lisinopril Cough   Tomato Rash        Medication List     STOP taking these medications    Accu-Chek FastClix Lancets Misc   Accu-Chek Guide Me w/Device Kit   busPIRone 10 MG tablet Commonly known as: BUSPAR    carvedilol 6.25 MG tablet Commonly known as: COREG   cetirizine 10 MG tablet Commonly known as: ZYRTEC   Entresto 49-51 MG Generic drug: sacubitril-valsartan   famotidine 40 MG tablet Commonly known as: PEPCID   FeroSul 325 (65 FE) MG tablet Generic drug: ferrous sulfate   fluticasone 50 MCG/ACT nasal spray Commonly known as: FLONASE   FreeStyle Libre 14 Day Reader Devi   gabapentin 300 MG capsule Commonly known as: NEURONTIN   glipiZIDE 10 MG tablet Commonly known as: GLUCOTROL   Guardian Sensor 3 Misc   HumuLIN 70/30 (70-30) 100 UNIT/ML injection Generic drug: insulin NPH-regular Human   Magnesium Oxide 200 MG Tabs   metFORMIN 1000 MG tablet Commonly known as: GLUCOPHAGE   NovoLOG FlexPen 100 UNIT/ML FlexPen Generic drug: insulin aspart Replaced by: insulin aspart 100 UNIT/ML injection   oxyCODONE 5 MG immediate release tablet Commonly known as: Roxicodone   predniSONE 10 MG tablet Commonly known as: DELTASONE   torsemide 20 MG tablet Commonly known as: DEMADEX   TRUEplus 5-Bevel Pen Needles 31G X 5 MM Misc Generic drug: Insulin Pen Needle   TRUEplus Insulin Syringe 31G X 5/16" 0.5 ML Misc Generic drug: Insulin Syringe-Needle U-100   Vitamin D (Ergocalciferol) 1.25 MG (50000 UNIT) Caps capsule Commonly known as: DRISDOL       TAKE these medications    acetaminophen 325 MG tablet Commonly known as: TYLENOL Take 2 tablets (650 mg total) by mouth every 6 (six) hours as needed for fever.   allopurinol 100 MG tablet Commonly known as: ZYLOPRIM Take 1 tablet (100 mg total) by mouth daily.   ALPRAZolam 0.5 MG tablet Commonly known as: XANAX Take 1 tablet (0.5 mg total) by mouth 2 (two) times daily.   amiodarone 200 MG tablet Commonly known as: PACERONE Take 1 tablet (200 mg total) by mouth 2 (two) times daily. What changed:  medication strength how much to take when to take this   docusate sodium 100 MG capsule Commonly known as:  COLACE Take 1 capsule (100 mg total) by mouth 2 (two) times daily.   Eliquis 5 MG Tabs tablet Generic drug: apixaban TAKE 1 TABLET (5 MG TOTAL) BY MOUTH 2 (TWO) TIMES DAILY. RESUME WITH EVENING DOSE ON SUNDAY What changed:  how much to take how to take this when to take this   insulin aspart 100 UNIT/ML injection Commonly known as: novoLOG Inject 6 Units into the skin 3 (three) times daily with meals. Replaces: NovoLOG FlexPen  100 UNIT/ML FlexPen   insulin aspart 100 UNIT/ML injection Commonly known as: novoLOG Inject 0-20 Units into the skin 3 (three) times daily with meals.   insulin aspart 100 UNIT/ML injection Commonly known as: novoLOG Inject 0-5 Units into the skin at bedtime.   insulin detemir 100 UNIT/ML injection Commonly known as: LEVEMIR Inject 0.5 mLs (50 Units total) into the skin 2 (two) times daily.   Jardiance 10 MG Tabs tablet Generic drug: empagliflozin TAKE 1 TABLET (10 MG TOTAL) BY MOUTH DAILY BEFORE BREAKFAST. What changed: how much to take   losartan 25 MG tablet Commonly known as: COZAAR Take 1 tablet (25 mg total) by mouth at bedtime.   nitroGLYCERIN 0.4 MG SL tablet Commonly known as: NITROSTAT Place 1 tablet (0.4 mg total) under the tongue every 5 (five) minutes x 3 doses as needed for chest pain.   ondansetron 4 MG/2ML Soln injection Commonly known as: ZOFRAN Inject 2 mLs (4 mg total) into the vein every 6 (six) hours as needed for nausea.   pantoprazole 40 MG tablet Commonly known as: PROTONIX Take 1 tablet (40 mg total) by mouth daily. Start taking on: May 23, 2021   sertraline 25 MG tablet Commonly known as: ZOLOFT Take 1 tablet (25 mg total) by mouth daily. Start taking on: May 23, 2021   spironolactone 25 MG tablet Commonly known as: ALDACTONE Take 1 tablet (25 mg total) by mouth daily.   traMADol 50 MG tablet Commonly known as: ULTRAM Take 2 tablets (100 mg total) by mouth every 12 (twelve) hours as needed for  moderate pain.   traZODone 100 MG tablet Commonly known as: DESYREL Take 1 tablet (100 mg total) by mouth at bedtime. What changed: additional instructions        Disposition   The patient will be discharged in stable condition to home. Discharge Instructions     Diet - low sodium heart healthy   Complete by: As directed    Increase activity slowly   Complete by: As directed        Follow-up Information     [facility] Follow up on 05/29/2021.   Specialty: Cardiology Why: Heart Failure Clinic at [facility] at 2:40 pm Entrance C  Garage Code [id number] Contact information: [address] [id number] [place], [place], FNP Follow up in 1 week(s).   Specialty: Family Medicine Why: For diabetes management Contact information: [place] [address] [phone]                   Duration of Discharge Encounter: Greater than 35 minutes   Signed, Leata Mouse  05/22/2021, 12:20 PM   Patient seen and examined with the above-signed Advanced Practice Provider and/or Housestaff. I personally reviewed laboratory data, imaging studies and relevant notes. I independently examined the patient and formulated the important aspects of the plan. I have edited the note to reflect any of my changes or salient points. I have personally discussed the plan with the patient and/or family.  He is ready for discharge to CIR. Please see today's rounding note for further details. The AHF team will follow in CIR.   Glori Bickers, MD  9:31 PM

## 2021-05-14 NOTE — Progress Notes (Signed)
Physical Therapy Treatment Patient Details Name: Jeffrey [name] MRN: [id number] DOB: 1975/10/19 Today's Date: 05/14/2021    History of Present Illness 45yo admitted with nausea and weight gain 7/26. Pt with Afib with RVR leading to CHF exacerbation, cardiogenic shock and respiratory failure. 7/27 cardioversion with intubation, extubated 8/4. 8/6 cardioversion. Hospital course complicated by gout in B feet and R elbow. PMhx: poorly controlled HTN, CKD stage 3a, PAF, DM2, morbid obesity, chronic systolic CHF, NICM s/p S-ICD, right renal cell CA s/p nephrectomy.    PT Comments    Pt with improved tolerance for mobility, transferring and ambulating for short distances multiple times this session. Pt demonstrates some improvement in LE strength but remains limited by LE pain. Pt will benefit from continued aggressive mobilization to improve activity tolerance and to aide in restoring independence in mobility. PT recommends inpatient rehab at the time of discharge to aide in return to independent mobility.   Follow Up Recommendations  CIR     Equipment Recommendations  Rolling walker with 5" wheels    Recommendations for Other Services Rehab consult     Precautions / Restrictions Precautions Precautions: Fall Precaution Comments: watch sats, HR <170 Restrictions Weight Bearing Restrictions: No    Mobility  Bed Mobility Overal bed mobility: Needs Assistance Bed Mobility: Supine to Sit     Supine to sit: Min guard     General bed mobility comments: verbal cues for technique    Transfers Overall transfer level: Needs assistance Equipment used: Rolling walker (2 wheeled) Transfers: Sit to/from Stand Sit to Stand: Min assist;Min guard         General transfer comment: minA from low bed, minG with cues for technique and elevated bed  Ambulation/Gait Ambulation/Gait assistance: Min guard Gait Distance (Feet): 3 Feet (3' forward and backward to/from bed) Assistive device:  Rolling walker (2 wheeled) Gait Pattern/deviations: Step-to pattern Gait velocity: reduced Gait velocity interpretation: <1.31 ft/sec, indicative of household ambulator General Gait Details: pt with short step-to gait, reduced foot clearance, increased trunk flexion   Stairs             Wheelchair Mobility    Modified Rankin (Stroke Patients Only)       Balance Overall balance assessment: Needs assistance Sitting-balance support: No upper extremity supported;Feet supported Sitting balance-Leahy Scale: Good     Standing balance support: Bilateral upper extremity supported Standing balance-Leahy Scale: Poor Standing balance comment: reliant on UE support of RW                            Cognition Arousal/Alertness: Awake/alert Behavior During Therapy: WFL for tasks assessed/performed Overall Cognitive Status: Within Functional Limits for tasks assessed                                        Exercises      General Comments General comments (skin integrity, edema, etc.): pt on 2L [place] upon PT arrival, PT weans to room air with stable sats when mobilizing      Pertinent Vitals/Pain Pain Assessment: 0-10 Pain Score: 3  Pain Location: BLE Pain Descriptors / Indicators: Aching Pain Intervention(s): Monitored during session    Home Living                      Prior Function  PT Goals (current goals can now be found in the care plan section) Acute Rehab PT Goals Patient Stated Goal: return to independence Progress towards PT goals: Progressing toward goals    Frequency    Min 3X/week      PT Plan Current plan remains appropriate    Co-evaluation              AM-PAC PT "6 Clicks" Mobility   Outcome Measure  Help needed turning from your back to your side while in a flat bed without using bedrails?: A Little Help needed moving from lying on your back to sitting on the side of a flat bed without  using bedrails?: A Little Help needed moving to and from a bed to a chair (including a wheelchair)?: A Little Help needed standing up from a chair using your arms (e.g., wheelchair or bedside chair)?: A Little Help needed to walk in hospital room?: A Little Help needed climbing 3-5 steps with a railing? : A Lot 6 Click Score: 17    End of Session   Activity Tolerance: Patient tolerated treatment well Patient left: in bed;with call [place]/phone within reach;with family/visitor present Nurse Communication: Mobility status PT Visit Diagnosis: Other abnormalities of gait and mobility (R26.89);Difficulty in walking, not elsewhere classified (R26.2);Muscle weakness (generalized) (M62.81)     Time: [id number] PT Time Calculation (min) (ACUTE ONLY): 20 min  Charges:  $Therapeutic Activity: 8-22 mins                     Zenaida Niece, PT, DPT Acute Rehabilitation Pager: [phone]    Zenaida Niece 05/14/2021, 4:57 PM

## 2021-05-14 NOTE — TOC Initial Note (Signed)
Transition of Care [facility]) - Initial/Assessment Note    Patient Details  Name: Jeffrey [name] MRN: [id number] Date of Birth: 1976-07-27  [facility] of [facility]) CM/SW Contact:    Erenest Rasher, RN Phone Number: [phone] 05/14/2021, 12:38 PM  Clinical Narrative:                 HF TOC CM spoke to pt at bedside and waiting PT/OT recommendations. Pt agreeable to CIR vs HH. Offered choice pt agreeable to CIR or [place] agency that accept insurance. Contacted [employer] rep, Tommi Rumps to follow up on acceptance for [facility] with pt's payor. Waiting confirmation. [facility] does not accept pt's insurance. Pt lives in home with wife and small child. Wife will start back work with school system soon. Pt will need DME for home such as wheelchair, and/or RW with seat, bedside commode, and shower chair.   Expected Discharge Plan: [place] Barriers to Discharge: Continued Medical Work up   Patient Goals and CMS Choice Patient states their goals for this hospitalization and ongoing recovery are:: patient wants rehab CMS Medicare.gov Compare Post Acute Care list provided to:: Patient Choice offered to / list presented to : Patient  Expected Discharge Plan and Services Expected Discharge Plan: [place] In-house Referral: Clinical Social Work Discharge Planning Services: CM Consult Post Acute Care Choice: [place] arrangements for the past 2 months: Single Family Home                 DME Arranged: 3-N-1, Walker rolling with seat, Shower stool DME Agency: [employer]                  Prior Living Arrangements/Services Living arrangements for the past 2 months: Single Family Home Lives with:: Spouse, Minor Children Patient language and need for interpreter reviewed:: Yes Do you feel safe going back to the place where you live?: Yes      Need for Family Participation in Patient Care: Yes (Comment) Care giver support system in place?: Yes  (comment)   Criminal Activity/Legal Involvement Pertinent to Current Situation/Hospitalization: No - Comment as needed  Activities of Daily Living      Permission Sought/Granted Permission sought to share information with : Case Manager, Family Supports, PCP Permission granted to share information with : Yes, Verbal Permission Granted  Share Information with NAME: Jeffrey [name]  Permission granted to share info w AGENCY: [facility] granted to share info w Relationship: wife  Permission granted to share info w Contact Information: [phone]  Emotional Assessment Appearance:: Provocative, Appears stated age Attitude/Demeanor/Rapport: Engaged, Gracious Affect (typically observed): Accepting Orientation: : Oriented to Self, Oriented to Place, Oriented to  Time, Oriented to Situation   Psych Involvement: No (comment)  Admission diagnosis:  Atrial fibrillation with rapid ventricular response (HCC) [I48.91] Nonsustained ventricular tachycardia (HCC) [I47.2] Atrial fibrillation with RVR ([place]) [I48.91] Chest pain, unspecified type [R07.9] Patient Active Problem List   Diagnosis Date Noted   Cardiogenic shock ([place])    Atrial fibrillation with RVR ([place]) 04/30/2021   Acute hypoxemic respiratory failure (HCC)    Right wrist pain 04/16/2021   Absolute anemia    Rectal bleeding    Iron deficiency 12/25/2020   Long term current use of anticoagulant - apixaban 12/25/2020   Paroxysmal atrial fibrillation (HCC)    Lactic acidosis 11/22/2020   Hypotension 11/22/2020   Aspiration into airway    ICD (implantable cardioverter-defibrillator) battery depletion 05/15/2020  Endotracheal tube present    Respiratory failure (HCC)    Acute encephalopathy    Acute pulmonary edema (HCC)    NICM (nonischemic cardiomyopathy) ([place]) 04/24/2020   S/P vasectomy 12/20/2019   Anxiety 12/20/2019   Hemoglobin A1C between 7% and 9% indicating borderline diabetic control 12/20/2019   Seasonal  allergies 12/20/2019   Insomnia 12/20/2019   Vasectomy evaluation 06/20/2019   Visual problems 06/20/2019   Blurry vision, bilateral 06/20/2019   Hyperglycemia 02/13/2019   Hyperosmolar (nonketotic) coma ([place]) 01/19/2019   AICD (automatic cardioverter/defibrillator) present 01/19/2019   Hyperlipidemia 12/07/2018   Follow-up exam 11/22/2018   Class 3 severe obesity due to excess calories with serious comorbidity and body mass index (BMI) of 45.0 to 49.9 in adult ([place]) 11/22/2018   Dizziness 11/22/2018   Lingular pneumonia 08/04/2018   Ventricular tachycardia ([place]) 12/30/2017   PAF (paroxysmal atrial fibrillation) ([place])    Dilated cardiomyopathy ([place])    Pollen allergy 02/17/2017   Dyslipidemia 02/17/2017   Hematochezia 11/08/2015   CKD (chronic kidney disease), stage III ([place]) 06/20/2015   Cardiac defibrillator in situ    Chronic systolic CHF (congestive heart failure) ([place]) 03/11/2015   Gouty arthritis 03/11/2015   Chest pain 12/28/2014   Acute renal failure superimposed on stage 3 chronic kidney disease ([place]) 04/08/2014   Aspirin allergy 04/08/2014   Renal cell carcinoma ([place])    Gout attack 11/10/2011   Morbid obesity ([place]) 10/23/2011   OSA (obstructive sleep apnea) 10/23/2011   Type 2 diabetes mellitus with stage 3 chronic kidney disease, with long-term current use of insulin ([place]) 10/22/2011   Hyperlipidemia associated with type 2 diabetes mellitus ([place]) 12/25/2010   Hypertension associated with diabetes ([place]) 12/25/2010   PCP:  [name], FNP (Inactive) Pharmacy:   [facility] [place] [address] [address] Phone: [phone] Fax: [phone]  [id number] [place]: [phone]: [phone] [id number] [id number] [id number]: [phone]: [phone]   Social Determinants of Health (SDOH) Interventions Food Insecurity Interventions: Assist with SNAP Application Financial Strain Interventions: Other (Comment) ([facility] and Food Stamps) Housing Interventions: Intervention Not Indicated Transportation Interventions: CoAnadarko Petroleum CorporationReadmission Risk Interventions No flowsheet data found.

## 2021-05-14 NOTE — TOC CM/SW Note (Signed)
HF TOC CM LTAC referral, unable to accept due to payor, Medicaid. [place], [place] ED TOC CM [phone]

## 2021-05-14 NOTE — Progress Notes (Signed)
1330 Received order for 6 minute walk test. Pt has been unable to walk with PT and has order for PT today for recommendation for discharge planning. We will follow pt's progress with PT as more appropriate for their services at this time. Graylon Good RN BSN 05/14/2021 1:37 PM

## 2021-05-15 ENCOUNTER — Ambulatory Visit: Payer: Medicaid Other

## 2021-05-15 ENCOUNTER — Telehealth: Payer: Self-pay | Admitting: Family Medicine

## 2021-05-15 DIAGNOSIS — J9601 Acute respiratory failure with hypoxia: Secondary | ICD-10-CM | POA: Diagnosis not present

## 2021-05-15 DIAGNOSIS — N183 Chronic kidney disease, stage 3 unspecified: Secondary | ICD-10-CM | POA: Diagnosis not present

## 2021-05-15 DIAGNOSIS — R570 Cardiogenic shock: Secondary | ICD-10-CM | POA: Diagnosis not present

## 2021-05-15 DIAGNOSIS — N170 Acute kidney failure with tubular necrosis: Secondary | ICD-10-CM | POA: Diagnosis not present

## 2021-05-15 DIAGNOSIS — N1831 Chronic kidney disease, stage 3a: Secondary | ICD-10-CM | POA: Diagnosis not present

## 2021-05-15 DIAGNOSIS — I4891 Unspecified atrial fibrillation: Secondary | ICD-10-CM | POA: Diagnosis not present

## 2021-05-15 LAB — CBC
HCT: 40.2 % (ref 39.0–52.0)
Hemoglobin: 12.9 g/dL — ABNORMAL LOW (ref 13.0–17.0)
MCH: 27.2 pg (ref 26.0–34.0)
MCHC: 32.1 g/dL (ref 30.0–36.0)
MCV: 84.8 fL (ref 80.0–100.0)
Platelets: 446 10*3/uL — ABNORMAL HIGH (ref 150–400)
RBC: 4.74 MIL/uL (ref 4.22–5.81)
RDW: 16.7 % — ABNORMAL HIGH (ref 11.5–15.5)
WBC: 8.7 10*3/uL (ref 4.0–10.5)
nRBC: 0 % (ref 0.0–0.2)

## 2021-05-15 LAB — GLUCOSE, CAPILLARY
Glucose-Capillary: 150 mg/dL — ABNORMAL HIGH (ref 70–99)
Glucose-Capillary: 163 mg/dL — ABNORMAL HIGH (ref 70–99)
Glucose-Capillary: 163 mg/dL — ABNORMAL HIGH (ref 70–99)
Glucose-Capillary: 168 mg/dL — ABNORMAL HIGH (ref 70–99)

## 2021-05-15 LAB — BASIC METABOLIC PANEL
Anion gap: 6 (ref 5–15)
BUN: 43 mg/dL — ABNORMAL HIGH (ref 6–20)
CO2: 25 mmol/L (ref 22–32)
Calcium: 9.5 mg/dL (ref 8.9–10.3)
Chloride: 106 mmol/L (ref 98–111)
Creatinine, Ser: 1.66 mg/dL — ABNORMAL HIGH (ref 0.61–1.24)
GFR, Estimated: 51 mL/min — ABNORMAL LOW (ref >60)
Glucose, Bld: 125 mg/dL — ABNORMAL HIGH (ref 70–99)
Potassium: 3.6 mmol/L (ref 3.5–5.1)
Sodium: 137 mmol/L (ref 135–145)

## 2021-05-15 LAB — COOXEMETRY PANEL
Carboxyhemoglobin: 0.9 % (ref 0.5–1.5)
Methemoglobin: 0.8 % (ref 0.0–1.5)
O2 Saturation: 54.7 %
Total hemoglobin: 12.9 g/dL (ref 12.0–16.0)

## 2021-05-15 LAB — MAGNESIUM: Magnesium: 2 mg/dL (ref 1.7–2.4)

## 2021-05-15 MED ORDER — ALTEPLASE 2 MG IJ SOLR
2.0000 mg | Freq: Once | INTRAMUSCULAR | Status: AC
  Administered 2021-05-15: 2 mg
  Filled 2021-05-15: qty 2

## 2021-05-15 MED ORDER — TORSEMIDE 20 MG PO TABS
20.0000 mg | ORAL_TABLET | Freq: Every day | ORAL | Status: DC
  Administered 2021-05-15: 20 mg via ORAL
  Filled 2021-05-15: qty 1

## 2021-05-15 MED ORDER — POTASSIUM CHLORIDE CRYS ER 20 MEQ PO TBCR
40.0000 meq | EXTENDED_RELEASE_TABLET | Freq: Every day | ORAL | Status: DC
  Administered 2021-05-15: 40 meq via ORAL
  Filled 2021-05-15: qty 2

## 2021-05-15 NOTE — Plan of Care (Signed)
  Problem: Education: Goal: Knowledge of General Education information will improve Description: Including pain rating scale, medication(s)/side effects and non-pharmacologic comfort measures Outcome: Progressing   Problem: Health Behavior/Discharge Planning: Goal: Ability to manage health-related needs will improve Outcome: Progressing   Problem: Clinical Measurements: Goal: Ability to maintain clinical measurements within normal limits will improve Outcome: Progressing   Problem: Activity: Goal: Risk for activity intolerance will decrease Outcome: Progressing   Problem: Coping: Goal: Level of anxiety will decrease Outcome: Progressing   Problem: Skin Integrity: Goal: Risk for impaired skin integrity will decrease Outcome: Progressing

## 2021-05-15 NOTE — Progress Notes (Signed)
LVAD Initial Psychosocial Screening  Date/Time Initiated:  05-14-2021 2:00 pm Referral Source:  Jeffrey [name], LVAD Coordinator Referral Reason:  LVAD Implantation Source of Information: Pt., Wife and chart review   Demographics Name:  Jeffrey [name] Address:  [address] [place] [address] Home phone:   n/a  Cell: [phone] Marital Status:  Married  Faith:  Muslim Primary Language:  English SS: [id number]    DOB:  1975/12/03  Medical & Follow-up Adherence to Medical regimen/INR checks: compliant  Medication adherence: compliant  Physician/Clinic Appointment Attendance: compliant  Advance Directives: Do you have a Living Will or Medical POA? No  Would you like to complete a Living Will and Medical POA prior to surgery?  Yes Do you have Goals of Care? No  Have you had a consult with the Palliative Care Team at [facility]? Yes Patient meeting with Palliative Care immediately following psychosocial assessment.  Psychological Health Appearance:  In hospital gown Mental Status:  Alert, oriented Eye Contact:  Good Thought Content:  Coherent Speech:  Unremarkable Mood:  Appropriate and Pleasant  Affect:  Appropriate to circumstance Insight:  Fair Judgement: Unimpaired Interaction Style:  Engaged  Family/Social Information Who lives in your home? Name:   Relationship:   Jeffrey [name]  Wife 75 yo and 15 yo  Children  Other family members/support persons in your life? Name:   Relationship:   Jeffrey [name]  Mother Jeffrey [name]  Sister Jeffrey [name]  MIL (Patient has large family support and 12 additional children that live with their mothers)  Caregiving Needs Who is the primary caregiver? [name]:  good Do you drive?  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 Yes 45yo and 45yo  Contact number: [phone]  Who is the secondary caregiver? [place] status:  good Do you drive?   yes Do you work?  Yes  Physical Limitations:  none Do you have other care giving responsibilities? none  Contact number: [phone]  Home Environment/Personal Care Do you have reliable phone service? Yes  If so, what is the number?  [phone] Do you own or rent your home? own Number of steps into the home? 5  How many levels in the home? 1 Assistive devices in the home? no Electrical needs for LVAD (3 prong outlets)? yes Second hand smoke exposure in the home? no Travel distance from [facility] 8 minutes  Community Are you active with community agencies/resources/homecare? No Agency Name:  n/a Are you active in a church, synagogue, mosque or other faith based community? No Faith based institutions name:  n/a What other sources do you have for spiritual support? none Are you active in any clubs or social organizations? none What do you do for fun?  Hobbies?  Interests? Patient likes to be with family and enjoys grilling  Education/Work Information What is the last grade of school you completed?  10th Preferred method of learning?  Written, Verbal, and Hands on Do you have any problems with reading or writing?  No Are you currently employed?  No  When were you last employed? December 18, 2018  Name of employer? 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 3 years If you are not working, do you plan to return to work after VAD surgery? No If yes, what type of employment do you hope to find? N/a Are you interested in job training  or learning new skills?  No Did you serve in the [employer]? No  If so, what branch? Other  Financial Information What is your source of income? Pending SSD Do you have difficulty meeting your monthly expenses? Yes If yes, which ones? varies How do you cope with this? Wife is working and Marine scientist our monies Can you budget for the monthly cost for dressing supplies post procedure? Yes  Primary Health  insurance:  Medicaid Secondary Insurance:  What are your prescription co-pays? $3 Do you use mail order for your prescriptions?  Yes Have you ever had to refuse medication due to cost?  No Have you applied for Medicaid?  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 Reports continued decline since MI. Do you have a PCP or other medical provider? Jeffrey Glatter, FNP (Inactive) Are you able to complete your ADL's? yes Do you have a history of trauma, physical, emotional, or sexual abuse? no Do you have any family history of heart problems? none Do you smoke now or past usage? Yes    Quit date: Last used prior to hospitalization 04-29-21 Do you drink alcohol now or past usage? Yes    Quit date:  Last drink was 04-26-21 Pt reports 5+ drinks on both weekend days (tequila and Coronas) Are you currently using illegal drugs or misuse of medication or past usage? Yes  Daily Marijuana use- last used 04-29-21 Have you ever been treated for substance abuse? No       Mental Health History How have you been feeling in the past year? "Great" Have you ever had any problems with depression, anxiety or other mental health issues? "I get down and depressed sometimes" Do you see a counselor, psychiatrist or therapist?  no If you are currently experiencing problems are you interested in talking with a professional? No Have you or are you taking medications for anxiety/depression or any mental health concerns?  No  Current Medications: Patient shared that he started taking xanax during this hospitalization due to anxiety What are your coping strategies under stressful situations? Smoke weed and talk to family Are there any other stressors in your life? no Have you had any past or current thoughts of suicide? no How many hours do you  sleep at night? Varies 3-12 hrs How is your appetite? good Would you be interested in attending the LVAD support group? yes  PHQ2 Depression Scale: 5 PHQ9 Depression scale (if positive PHQ2 screen):   will complete on next clinic visit   Legal Do you currently have any legal issues/problems?  Yes, in the rears for past child support Have you had any legal issues/problems in the past?  no Do you have a Durable POA?  no   Plan for VAD Implementation Do you know and understand what happens during the VAD surgery? Patient Verbalizes Understanding  of surgery and able to describe details What do you know about the risks and side effect associated with VAD surgery? Patient Verbalizes Understanding  of risks (infection, stroke and death) Explain what will happen right after surgery: Patient Verbalizes Understanding  of OR to ICU and will be intubated What is your plan for transportation for the first 8 weeks post-surgery? (Patients are not recommended to drive post-surgery for 8 weeks)  Driver:    wife and mother Do you have  airbags in your vehicle?  There is a risk of discharging the device if the airbag were to deploy. What do you know about your diet post-surgery? Patient Verbalizes Understanding  of Heart healthy How do you plan to monitor your medications, current and future?   Take from the pill bottles How do you plan to complete ADL's post-surgery?  Will ask for help Will it be difficult to ask for help from your caregivers?  no  Please explain what you hope will be improved about your life as a result of receiving the LVAD? To be more active and feeling better Please tell me your biggest concern or fear about living with the LVAD?  No fear living with it, my fear is the procedure How do you cope with your concerns and fears?  Deal with it myself and think positive Please explain your understanding of how their body will change. Are you worried about these changes? It won't bother  me. Do you see any barriers to your surgery or follow-up? Patient denies any barriers  Understanding of LVAD Patient states understanding of the following: Surgical procedures and risks, Electrical need for LVAD (3 prong outlets), Safety precautions with LVAD (water, etc.), LVAD daily self-care (dressing changes, computer check, extra supplies), Outpatient follow up (LVAD clinic appts, monitoring blood thinners), and Need for Emergency Planning  Discussed and Reviewed with Patient and Caregiver  Patient's current level of motivation to prepare for LVAD: Patient expressed desire to be around for his children Patient's present Level of Consent for LVAD: Ready    Education provided to patient/family/caregiver:   Caregiver role and responsibiltiy, Financial planning for LVAD, Role of Clinical Social Worker, and Signs of Depression and Anxiety    Caregiver questions Please explain what you hope will be improved about your life and loved one's life as a result of receiving the LVAD?  Wife reports she hopes that the patient will feel more "secure and feel better more days of the week" What is your biggest concern or fear about caregiving with an LVAD patient?  "I am nervous about doing it right" What is your plan for availability to provide care 24/7 x2 weeks post op and dressing changes ongoing?  Wife states she can take the time from work and has patient's mother as well. Who is the relief/backup caregiver and what is their availability?  Patient's mother Faith Preferred method of learning? Written, Verbal, and Hands on  Do you drive? yes How do you handle stressful situations?  Massage and take walks Do you think you can do this? yes Is there anything that concerns about caregiving?  no Do you provide caregiving to anyone else? 45yo and a 45yo   Caregiver's current level of motivation to prepare for LVAD: Very Motivated Caregiver's present level of consent for LVAD: Ready  Clinical  Interventions Needed:  CSW will monitor signs and symptoms of depression and assist with adjustment to life with an LVAD. CSW will complete a PHQ9 prior to surgery. CSW will monitor for substance use treatment programs post VAD implant. Patient denies need at this time but CSW discussed continued monitoring and support post surgery.  CSW will refer patient for HPOA, and/or Living Will if not completed prior to surgery if still wishing to complete.  CSW will continue to follow for further follow up and assistance with pending disability application. CSW encouraged attendance with the LVAD Support Group to assist further with adjustment and post implant peer support.     Clinical  Impressions/Recommendations:  Patient is a 45 yo married male with 2 young children (18yo and 10yo) who reside in the home. Patient has 12 other children who live outside the home with their mothers. 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 Patient states he has been unable to work since then due to heart disease and inability to stand for long periods of time. Patient's wife will be the primary caregiver and his mother will be the back up. Patient has a pending disability application and has medicaid as his insurance. He denies any trauma or abuse in his past. He admits to tobacco use and last smoked prior to hospitalization. He admits to 5+ drinks of Corona and tequila on both weekend days and referred to himself as a weekend warrior. He last drank the weekend prior to hospitalization. He also admitted to daily use of marijuana and last used prior to admission. He states that he "gets down and depressed sometimes" but denies any treatment or medication. He has started xanax since hospitalization. He states that he smokes weed and talks with family members when he gets stressed. He denies any suicidal ideation in the past and scored a 5 on the [facility]. Patient acknowledges  he has some things to work on specifically his substance use and open to mental health supportive interventions. He hopes to be more active and feeling better by getting the LVAD. He denies any fears but states concern about the surgery. Patient appears to have a good support system and open to interventions to support him post LVAD. CSW will follow up with patient and assist with added supportive interventions to assist with successful VAD life should he be a candidate from a medical standpoint. Raquel Sarna, LCSW, [place]     Louann Liv, LCSW

## 2021-05-15 NOTE — Telephone Encounter (Signed)
   Jeffrey [name] DOB: [birth year] MRN: [id number]   RIDER WAIVER AND RELEASE OF LIABILITY  For purposes of improving physical access to our facilities, Jeffrey [name] is pleased to partner with third parties to provide Jeffrey [name] patients or other authorized individuals the option of convenient, on-demand ground transportation services (the [place]") through use of the technology service that enables users to request on-demand ground transportation from independent third-party providers.  By opting to use and accept these [employer], I, the undersigned, hereby agree on behalf of myself, and on behalf of any minor child using the Government social research officer for whom I am the parent or legal guardian, as follows:  Government social research officer provided to me are provided by independent third-party transportation providers who are not [employer] or employees and who are unaffiliated with [employer]. [place] is neither a transportation carrier nor a common or public carrier. Jeffrey [name] has no control over the quality or safety of the transportation that occurs as a result of the [employer]. Jeffrey [name] cannot guarantee that any third-party transportation provider will complete any arranged transportation service. Jeffrey [name] makes no representation, warranty, or guarantee regarding the reliability, timeliness, quality, safety, suitability, or availability of any of the Transport Services or that they will be error free. I fully understand that traveling by vehicle involves risks and dangers of serious bodily injury, including permanent disability, paralysis, and death. I agree, on behalf of myself and on behalf of any minor child using the Transport Services for whom I am the parent or legal guardian, that the entire risk arising out of my use of the [employer] remains solely with me, to the maximum extent permitted under applicable law. The [employer] are provided "as is"  and "as available." [place] disclaims all representations and warranties, express, implied or statutory, not expressly set out in these terms, including the implied warranties of merchantability and fitness for a particular purpose. I hereby waive and release Jeffrey [name], its agents, employees, officers, directors, representatives, insurers, attorneys, assigns, successors, subsidiaries, and affiliates from any and all past, present, or future claims, demands, liabilities, actions, causes of action, or suits of any kind directly or indirectly arising from acceptance and use of the [employer]. I further waive and release Jeffrey [name] and its affiliates from all present and future liability and responsibility for any injury or death to persons or damages to property caused by or related to the use of the [employer]. I have read this Waiver and Release of Liability, and I understand the terms used in it and their legal significance. This Waiver is freely and voluntarily given with the understanding that my right (as well as the right of any minor child for whom I am the parent or legal guardian using the [employer]) to legal recourse against Jeffrey [name] in connection with the [employer] is knowingly surrendered in return for use of these services.   I attest that I read the consent document to Jeffrey [name], gave Jeffrey [name] the opportunity to ask questions and answered the questions asked (if any). I affirm that Jeffrey [name] then provided consent for he's participation in this program.     Jeffrey [name]

## 2021-05-15 NOTE — Progress Notes (Signed)
Occupational Therapy Treatment Patient Details Name: Jeffrey [name] MRN: [id number] DOB: [birth year] Today's Date: 05/15/2021    History of present illness 45yo admitted with nausea and weight gain 7/26. Pt with Afib with RVR leading to CHF exacerbation, cardiogenic shock and respiratory failure. 7/27 cardioversion with intubation, extubated 8/4. 8/6 cardioversion. Hospital course complicated by gout in B feet and R elbow. PMhx: poorly controlled HTN, CKD stage 3a, PAF, DM2, morbid obesity, chronic systolic CHF, NICM s/p S-ICD, right renal cell CA s/p nephrectomy.   OT comments  Right elbow pain has resolved with pt demonstrating ability to use R UE normally during ADL. He rates his foot pain as 3/10. Pt requires rest breaks between position changes (supine>sit, sit>stand) as he fatigues easily and more assist as he fatigues with mobility. Pt relies on assistance of his family for LB ADL, will benefit from education in use of AE. Pt is highly motivated to return to independence. Updated d/c recommendation to CIR for intensive rehab.   Follow Up Recommendations  CIR    Equipment Recommendations  3 in 1 bedside commode    Recommendations for Other Services      Precautions / Restrictions Precautions Precautions: Fall Precaution Comments: watch sats, HR <170       Mobility Bed Mobility Overal bed mobility: Needs Assistance Bed Mobility: Supine to Sit;Sit to Supine     Supine to sit: Supervision Sit to supine: Supervision   General bed mobility comments: HOB up, use of rail and momentum, HOB up    Transfers Overall transfer level: Needs assistance Equipment used: Rolling walker (2 wheeled) Transfers: Sit to/from Stand Sit to Stand: Min guard;Min assist         General transfer comment: min assist as pt fatigued, pt self cued hand placement with RW    Balance Overall balance assessment: Needs assistance   Sitting balance-Leahy Scale: Good     Standing balance support:  Bilateral upper extremity supported Standing balance-Leahy Scale: Poor                             ADL either performed or assessed with clinical judgement   ADL Overall ADL's : Needs assistance/impaired Eating/Feeding: Independent;Bed level   Grooming: Sitting;Set up;Oral care   Upper Body Bathing: Minimal assistance;Sitting   Lower Body Bathing: Maximal assistance;Sit to/from stand   Upper Body Dressing : Set up;Sitting   Lower Body Dressing: Maximal assistance;Sit to/from stand   Toilet Transfer: Minimal assistance;Stand-pivot;BSC;RW   Toileting- Clothing Manipulation and Hygiene: Maximal assistance;Sit to/from stand         General ADL Comments: Pt with decreased activity tolerance.     Vision       Perception     Praxis      Cognition Arousal/Alertness: Awake/alert Behavior During Therapy: WFL for tasks assessed/performed Overall Cognitive Status: Within Functional Limits for tasks assessed                                          Exercises     Shoulder Instructions       General Comments      Pertinent Vitals/ Pain       Pain Assessment: 0-10 Pain Score: 3  Pain Location: BLE Pain Descriptors / Indicators: Aching Pain Intervention(s): Monitored during session;Repositioned  Home Living  Prior Functioning/Environment              Frequency  Min 2X/week        Progress Toward Goals  OT Goals(current goals can now be found in the care plan section)  Progress towards OT goals: Progressing toward goals  Acute Rehab OT Goals Patient Stated Goal: return to independence OT Goal Formulation: With patient Time For Goal Achievement: 05/26/21 Potential to Achieve Goals: Good  Plan Discharge plan needs to be updated    Co-evaluation                 AM-PAC OT "6 Clicks" Daily Activity     Outcome Measure   Help from another person eating  meals?: None Help from another person taking care of personal grooming?: A Little Help from another person toileting, which includes using toliet, bedpan, or urinal?: A Lot Help from another person bathing (including washing, rinsing, drying)?: A Lot Help from another person to put on and taking off regular upper body clothing?: A Little Help from another person to put on and taking off regular lower body clothing?: A Lot 6 Click Score: 16    End of Session Equipment Utilized During Treatment: Rolling walker;Oxygen  OT Visit Diagnosis: Pain;Muscle weakness (generalized) (M62.81)   Activity Tolerance Patient tolerated treatment well   Patient Left in bed;with call [name] within reach;with family/visitor present   Nurse Communication          Time: [id number] OT Time Calculation (min): 19 min  Charges: OT General Charges $OT Visit: 1 Visit OT Treatments $Self Care/Home Management : 8-22 mins  Jeffrey [name], OTR/L Acute Rehabilitation Services Pager: [phone] Office: [phone]    Malka So 05/15/2021, 8:23 AM

## 2021-05-15 NOTE — TOC CM/SW Note (Signed)
HF TOC CM did contacted CIR and they are following. His insurance plan would require a prior auth if pt was accepted to IP rehab. [place], Heart Failure TOC CM [phone]

## 2021-05-15 NOTE — Progress Notes (Signed)
Pt has home CPAP at bedside. Pt states he felt like someone turned his pressure up but I explained home health agencies lock the settings so that we cannot adjust them. He stated he would try to wear it and asked RT to fill water chamber up. Sterile water was placed in the chamber and pt placed his CPAP on.

## 2021-05-15 NOTE — Progress Notes (Addendum)
Patient ID: Jeffrey [name], male   DOB: [birth year], 45 y.o.   MRN: [id number]     Advanced Heart Failure Rounding Note  PCP-Cardiologist: Glori Bickers, MD   Subjective:    04/30/21 Echo: EF < 20%, severe LV dilation, moderate RV dysfunction 05/05/21 Febrile to 101.2. Started on zyvox+ cefepime 05/05/21 Sputum Cx: rare staph aureus   8/3: Linezolid and cefepime switched to Ancef 8/5: Afib with RVR. Received amio bolus/load.  EP consulted  8/6 DC-CV  Co-ox 78% > 67% > 59.7%>55%   Torsemide held yesterday. Wt up 3 lb. CVP 7  Denies dyspnea.   Remains in NSR w/ PACs. No further Afib past 24 hr.   Still feels week. Awaiting CIR eval.  Denies any further gout pain.   VAD w/u ongoing. CT maxillofacial negative for dental carries.    RHC, 05/13/21: RA = 6 RV = 29/3 PA = 35/12 (22) PCW = 16 Fick cardiac output/index = 5.5/2.3 PVR = 1.1 WU Ao sat = 98% PA sat = 65%, 67%    Objective:   Weight Range: (!) 139.5 kg Body mass index is 46.76 kg/m.   Vital Signs:   Temp:  [97.7 F (36.5 C)-98.2 F (36.8 C)] 98.2 F (36.8 C) (08/11 0356) Pulse Rate:  [50-88] 68 (08/11 0356) Resp:  [13-23] 17 (08/11 0600) BP: (99-121)/(55-98) 101/67 (08/11 0356) SpO2:  [92 %-96 %] 93 % (08/11 0356) Weight:  [139.5 kg] 139.5 kg (08/11 0600) Last BM Date: 05/14/21  Weight change: Filed Weights   05/13/21 0529 05/14/21 0455 05/15/21 0600  Weight: (!) 141.2 kg (!) 138.3 kg (!) 139.5 kg    Intake/Output:   Intake/Output Summary (Last 24 hours) at 05/15/2021 0713 Last data filed at 05/15/2021 0600 Gross per 24 hour  Intake 834 ml  Output 2000 ml  Net -1166 ml      Physical Exam    CVP 7 General:  Well appearing, obese. No respiratory difficulty HEENT: normal + left IJ CVC  Neck: supple. Thick neck no JVD. Carotids 2+ bilat; no bruits. No lymphadenopathy or thyromegaly appreciated. Cor: PMI nondisplaced. Regular rate & rhythm. No rubs, gallops or murmurs. Lungs: clear Abdomen:  soft, nontender, nondistended. No hepatosplenomegaly. No bruits or masses. Good bowel sounds. Extremities: no cyanosis, clubbing, rash, edema Neuro: alert & oriented x 3, cranial nerves grossly intact. moves all 4 extremities w/o difficulty. Affect pleasant.    Telemetry   NSR w/ PACs HR 90s  Labs    CBC Recent Labs    05/14/21 0856 05/15/21 0402  WBC 9.5 8.7  HGB 13.6 12.9*  HCT 41.5 40.2  MCV 82.5 84.8  PLT 443* [id number]*   Basic Metabolic Panel Recent Labs    05/14/21 0111 05/15/21 0402  NA 138 137  K 3.9 3.6  CL 104 106  CO2 23 25  GLUCOSE 151* 125*  BUN 51* 43*  CREATININE 1.76* 1.66*  CALCIUM 9.9 9.5  MG  --  2.0   Liver Function Tests No results for input(s): AST, ALT, ALKPHOS, BILITOT, PROT, ALBUMIN in the last 72 hours.  No results for input(s): LIPASE, AMYLASE in the last 72 hours. Cardiac Enzymes No results for input(s): CKTOTAL, CKMB, CKMBINDEX, TROPONINI in the last 72 hours.  BNP: BNP (last 3 results) Recent Labs    11/21/20 2222 04/29/21 1247  BNP 65.0 1,407.7*    ProBNP (last 3 results) No results for input(s): PROBNP in the last 8760 hours.   D-Dimer No results for input(s): DDIMER in  the last 72 hours. Hemoglobin A1C No results for input(s): HGBA1C in the last 72 hours.  Fasting Lipid Panel Recent Labs    05/12/21 1303  CHOL 149  HDL 31*  LDLCALC 94  TRIG 120  CHOLHDL 4.8    Thyroid Function Tests No results for input(s): TSH, T4TOTAL, T3FREE, THYROIDAB in the last 72 hours.  Invalid input(s): FREET3   Other results:   Imaging    CT MAXILLOFACIAL WO CONTRAST  Result Date: 05/14/2021 CLINICAL DATA:  LVAD workup EXAM: CT MAXILLOFACIAL WITHOUT CONTRAST TECHNIQUE: Multidetector CT imaging of the maxillofacial structures was performed. Multiplanar CT image reconstructions were also generated. COMPARISON:  CT head 02/11/2016 FINDINGS: Osseous: No osseous abnormality.  No fracture or bone lesion No dental infection  identified. Negative for caries or periapical lucency. Orbits: No orbital mass or edema. Sinuses: Clear Soft tissues: No soft tissue mass or edema in the face. Limited intracranial: Negative IMPRESSION: Negative CT face.  No evidence of dental infection. Electronically Signed   By: Franchot Gallo M.D.   On: 05/14/2021 13:47     Medications:     Scheduled Medications:  albuterol  2.5 mg Nebulization Once   allopurinol  100 mg Oral Daily   ALPRAZolam  0.5 mg Oral BID   amiodarone  200 mg Oral BID   apixaban  5 mg Oral BID   Chlorhexidine Gluconate Cloth  6 each Topical Daily   colchicine  0.6 mg Oral BID   docusate sodium  100 mg Oral BID   empagliflozin  10 mg Oral Daily   insulin aspart  0-20 Units Subcutaneous TID WC   insulin aspart  0-5 Units Subcutaneous QHS   insulin aspart  6 Units Subcutaneous TID WC   insulin detemir  50 Units Subcutaneous BID   isosorbide-hydrALAZINE  1 tablet Oral TID   mouth rinse  15 mL Mouth Rinse BID   pantoprazole  40 mg Oral Daily   Ensure Max Protein  11 oz Oral BID   sacubitril-valsartan  1 tablet Oral BID   sodium chloride flush  10-40 mL Intracatheter Q12H   sodium chloride flush  3 mL Intravenous Q12H   sodium chloride flush  3 mL Intravenous Q12H   spironolactone  25 mg Oral Daily   traZODone  100 mg Oral QHS    Infusions:  sodium chloride Stopped (04/30/21 2254)   sodium chloride      PRN Medications: sodium chloride, sodium chloride, acetaminophen, acetaminophen, nitroGLYCERIN, ondansetron (ZOFRAN) IV, ondansetron (ZOFRAN) IV, sodium chloride flush, sodium chloride flush, sodium chloride flush, sorbitol, traMADol  Assessment/Plan   1. Atrial fibrillation: Paroxysmal - Suspect AF with RVR drove initial CHF exacerbation. Emergent TEE-guided DCCV 7/27 due to inability to control HR - Recurrent AF on 8/5. s/p DC-CV on 05/10/21. Remains in NSR now w/ PACs - Seen by EP. Not a candidate for ablation due to body habitus  (could consider AVN  ablation and CRT, if needed)  - Amiodarone initially stopped due to concern for toxicity, but resumed after reversion to rapid afib on 8/5. IV amio off, now on po 200 mg BID - Continue Eliquis.  - Keep K> 4 Mg > 2  2. 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  Echo in 9/21 with EF 20-25%.  Echo this admission with EF < 20%, severe LV dilation, moderate RV dysfunction.   - s/p S-ICD - 7/27: intubated with pulmonary edema and required addition of NE. Off NE.  Now extubated and off all pressors  - Dobutamine weaned off on 8/3, Co-ox 78% > 65% > 59.7% - RHC 08/09 with PCW 16, RA 6, Fick CI 2.3 - Torsemide held w/ low CVP yesterday. Wt trending up. CVP 7  today - Restart Torsemide today, reduce dose to 20 mg daily  - Continue Bidil 1 tid - Entresto 24/26 bid - Continue spiro 25 mg daily - Jardiance 10 mg daily - No BB for now d/t recent shock - Will not titrate meds any further today d/t orthostatic dizziness. BP low 100s this am. - VAD work-up started.   3. Acute hypoxemic respiratory failure - Resp culture + for rare S. Aureus (MSSA)  - Became febrile on 8/1 and started on linezolid and cefepime. Cefepime switched to Ancef and Linezolid discontinued on 8/3. Ancef stopped 8/7 - Initial concern for amiodarone toxicity -- amio stopped and steroids started, but steroids stopped the next morning on 8/4 -- per CCM team, more likely atelectasis driving hypoxia.  Deferred management to them  - Extubated on 8/4, initially to BiPAP and now on [place]  4. AKI on CKD stage 3  - Has solitary kidney.   - Suspect cardiorenal syndrome/low output with AF/RVR in setting of cardiomyopathy.   - Baseline creatinine is 1.5-1.7, Creatinine peaked at 4.65. 1.66 today - Urinary obstruction likely played a role. Urology placed foley catheter with improvement in UOP. Foley removed due to concern for infection after fever developed  5. OSA - CPAP   6.  Hypernatremia  - resolved  7. Hypokalemia - K 3.6  8. Acute gout - Has h/o gout . Uric acid 10.4 - Prednisone x3 days - Colchicine added with improvement - Allopurinol restarted 8/10  9. DM2. Poorly controlled - resume Jardiance - continue SSI - Hgb A1c 9.4 - DM consulted, appreciate input  10. Leukocytosis -WBC 8.5 > 10.9 > 9.5>8.7 -? If related to prednisone use.  - AF  Discharge planning:  Awaiting CIR evaluation   Medications to [facility]   F/u in HF clinic scheduled    Lyda Jester, Hershal Coria  05/15/2021, 7:13 AM  Advanced Heart Failure Team Pager [phone] (M-F; 7a - 5p)  Please contact [facility] for night-coverage after hours (5p -7a ) and weekends on [email or web address]   Patient seen and examined with the above-signed Advanced Practice Provider and/or Housestaff. I personally reviewed laboratory data, imaging studies and relevant notes. I independently examined the patient and formulated the important aspects of the plan. I have edited the note to reflect any of my changes or salient points. I have personally discussed the plan with the patient and/or family.  Feels ok. Still weak. Struggles with ambulation. Remains in NSR on IV amio. Volume status ok.   General:  Lying in bed . No resp difficulty HEENT: normal Neck: supple. no JVD. Carotids 2+ bilat; no bruits. No lymphadenopathy or thryomegaly appreciated. Cor: PMI nondisplaced. Regular rate & rhythm. No rubs, gallops or murmurs. Lungs: clear Abdomen: obese soft, nontender, nondistended. No hepatosplenomegaly. No bruits or masses. Good bowel sounds. Extremities: no cyanosis, clubbing, rash, edema Neuro: alert & orientedx3, cranial nerves grossly intact. moves all 4 extremities w/o difficulty. Affect pleasant  Volume status and co-ox ok. Restart torsemide. Continue current GDMT. Can switch IV amio to po Continue Eliquis.   Ready for d/c to CIR. VAD w/u to continue.  Glori Bickers,  MD  6:00 PM

## 2021-05-15 NOTE — Progress Notes (Signed)
IP rehab admissions - I met with patient and I spoke with patient's wife by phone.  They would like inpatient rehab prior to DC home.  I will open the insurance case and request CIR admission.  I will update all once I hear back from insurance carrier.  Call for questions.  [phone]

## 2021-05-15 NOTE — Progress Notes (Signed)
Physical Therapy Treatment Patient Details Name: Jeffrey [name] MRN: [id number] DOB: Jan 12, 1976 Today's Date: 05/15/2021    History of Present Illness 45yo admitted with nausea and weight gain 7/26. Pt with Afib with RVR leading to CHF exacerbation, cardiogenic shock and respiratory failure. 7/27 cardioversion with intubation, extubated 8/4. 8/6 cardioversion. Hospital course complicated by gout in B feet and R elbow. 8/9 cardiac cath. PMhx: poorly controlled HTN, CKD stage 3a, PAF, DM2, morbid obesity, chronic systolic CHF, NICM s/p S-ICD, right renal cell CA s/p nephrectomy.    PT Comments    Pt tolerated treatment well with VSS throughout. Pt progressing toward goals by demonstrating bed mobility and transfers with decreased level of assistance. Pt also increased ambulation distance compared to previous session. Pt educated on precautions following LVAD.  Follow Up Recommendations  CIR     Equipment Recommendations  Rolling walker with 5" wheels;3in1 (PT) (bariatric)    Recommendations for Other Services       Precautions / Restrictions Precautions Precautions: Fall;Other (comment) Precaution Comments: no lifting post cath    Mobility  Bed Mobility   Bed Mobility: Supine to Sit     Supine to sit: Supervision;HOB elevated     General bed mobility comments: HOB 40 degrees with use of rail    Transfers Overall transfer level: Needs assistance   Transfers: Sit to/from Stand Sit to Stand: Min guard         General transfer comment: cues for hand placement with pt able to stand from bed and recliner  Ambulation/Gait Ambulation/Gait assistance: Min assist;+2 safety/equipment Gait Distance (Feet): 8 Feet Assistive device: Rolling walker (2 wheeled) Gait Pattern/deviations: Step-through pattern;Decreased stride length;Wide base of support   Gait velocity interpretation: 1.31 - 2.62 ft/sec, indicative of limited community ambulator General Gait Details: pt walked 6'  then 8' with seated rest, RW and close chair follow, Cues for posture and limited by fatigue.   Stairs             Wheelchair Mobility    Modified Rankin (Stroke Patients Only)       Balance Overall balance assessment: Needs assistance   Sitting balance-Leahy Scale: Good     Standing balance support: Bilateral upper extremity supported Standing balance-Leahy Scale: Poor Standing balance comment: reliant on UE support of RW                            Cognition Arousal/Alertness: Awake/alert Behavior During Therapy: WFL for tasks assessed/performed Overall Cognitive Status: Within Functional Limits for tasks assessed                                        Exercises General Exercises - Lower Extremity Long Arc Quad: AROM;Both;Seated;Other reps (comment) (30 reps) Hip Flexion/Marching: AROM;Both;Seated;20 reps    General Comments        Pertinent Vitals/Pain Pain Score: 8  Pain Location: bil knees Pain Descriptors / Indicators: Aching;Guarding Pain Intervention(s): Limited activity within patient's tolerance;Monitored during session;Repositioned    Home Living                      Prior Function            PT Goals (current goals can now be found in the care plan section) Progress towards PT goals: Progressing toward goals    Frequency  Min 3X/week      PT Plan Current plan remains appropriate    Co-evaluation              AM-PAC PT "6 Clicks" Mobility   Outcome Measure  Help needed turning from your back to your side while in a flat bed without using bedrails?: A Little Help needed moving from lying on your back to sitting on the side of a flat bed without using bedrails?: A Little Help needed moving to and from a bed to a chair (including a wheelchair)?: A Little Help needed standing up from a chair using your arms (e.g., wheelchair or bedside chair)?: A Little Help needed to walk in hospital  room?: A Lot Help needed climbing 3-5 steps with a railing? : Total 6 Click Score: 15    End of Session Equipment Utilized During Treatment: Gait belt Activity Tolerance: Patient tolerated treatment well Patient left: in chair;with call [place]/phone within reach;with chair alarm set Nurse Communication: Mobility status PT Visit Diagnosis: Other abnormalities of gait and mobility (R26.89);Difficulty in walking, not elsewhere classified (R26.2);Muscle weakness (generalized) (M62.81)     Time: [id number] PT Time Calculation (min) (ACUTE ONLY): 31 min  Charges:  $Gait Training: 8-22 mins $Therapeutic Exercise: 8-22 mins                     [name], SPT Acute Rehab: [phone]    [name] 05/15/2021, 12:59 PM

## 2021-05-16 DIAGNOSIS — N1831 Chronic kidney disease, stage 3a: Secondary | ICD-10-CM | POA: Diagnosis not present

## 2021-05-16 DIAGNOSIS — N170 Acute kidney failure with tubular necrosis: Secondary | ICD-10-CM | POA: Diagnosis not present

## 2021-05-16 DIAGNOSIS — I4891 Unspecified atrial fibrillation: Secondary | ICD-10-CM | POA: Diagnosis not present

## 2021-05-16 DIAGNOSIS — J9601 Acute respiratory failure with hypoxia: Secondary | ICD-10-CM | POA: Diagnosis not present

## 2021-05-16 DIAGNOSIS — N183 Chronic kidney disease, stage 3 unspecified: Secondary | ICD-10-CM | POA: Diagnosis not present

## 2021-05-16 DIAGNOSIS — R570 Cardiogenic shock: Secondary | ICD-10-CM | POA: Diagnosis not present

## 2021-05-16 LAB — GLUCOSE, CAPILLARY
Glucose-Capillary: 144 mg/dL — ABNORMAL HIGH (ref 70–99)
Glucose-Capillary: 180 mg/dL — ABNORMAL HIGH (ref 70–99)
Glucose-Capillary: 124 mg/dL — ABNORMAL HIGH (ref 70–99)
Glucose-Capillary: 191 mg/dL — ABNORMAL HIGH (ref 70–99)

## 2021-05-16 LAB — CBC
HCT: 39.6 % (ref 39.0–52.0)
Hemoglobin: 12.4 g/dL — ABNORMAL LOW (ref 13.0–17.0)
MCH: 26.6 pg (ref 26.0–34.0)
MCHC: 31.3 g/dL (ref 30.0–36.0)
MCV: 85 fL (ref 80.0–100.0)
Platelets: 441 10*3/uL — ABNORMAL HIGH (ref 150–400)
RBC: 4.66 MIL/uL (ref 4.22–5.81)
RDW: 16.7 % — ABNORMAL HIGH (ref 11.5–15.5)
WBC: 7.6 10*3/uL (ref 4.0–10.5)
nRBC: 0 % (ref 0.0–0.2)

## 2021-05-16 LAB — BASIC METABOLIC PANEL WITH GFR
Anion gap: 6 (ref 5–15)
BUN: 38 mg/dL — ABNORMAL HIGH (ref 6–20)
CO2: 23 mmol/L (ref 22–32)
Calcium: 9 mg/dL (ref 8.9–10.3)
Chloride: 107 mmol/L (ref 98–111)
Creatinine, Ser: 1.62 mg/dL — ABNORMAL HIGH (ref 0.61–1.24)
GFR, Estimated: 53 mL/min — ABNORMAL LOW
Glucose, Bld: 151 mg/dL — ABNORMAL HIGH (ref 70–99)
Potassium: 4.1 mmol/L (ref 3.5–5.1)
Sodium: 136 mmol/L (ref 135–145)

## 2021-05-16 LAB — COOXEMETRY PANEL
Carboxyhemoglobin: 0.9 % (ref 0.5–1.5)
Methemoglobin: 0.9 % (ref 0.0–1.5)
O2 Saturation: 60 %
Total hemoglobin: 13.7 g/dL (ref 12.0–16.0)

## 2021-05-16 LAB — FACTOR 5 LEIDEN

## 2021-05-16 MED ORDER — COLCHICINE 0.6 MG PO TABS
0.6000 mg | ORAL_TABLET | Freq: Every day | ORAL | Status: DC
  Filled 2021-05-16: qty 1

## 2021-05-16 MED ORDER — SERTRALINE HCL 25 MG PO TABS
25.0000 mg | ORAL_TABLET | Freq: Every day | ORAL | Status: DC
  Administered 2021-05-16 – 2021-05-22 (×7): 25 mg via ORAL
  Filled 2021-05-16 (×7): qty 1

## 2021-05-16 MED ORDER — ISOSORB DINITRATE-HYDRALAZINE 20-37.5 MG PO TABS
0.5000 | ORAL_TABLET | Freq: Three times a day (TID) | ORAL | Status: DC
  Administered 2021-05-16: 0.5 via ORAL
  Filled 2021-05-16: qty 1

## 2021-05-16 NOTE — TOC CM/SW Note (Signed)
HF TOC CM contacted [facility] for Peer 2 Peer for CIR denial. Left message for return call to attending. [facility] has not returned call for P2P. HF TOC CSW will follow up with SNF rehab for placement. [place], Heart Failure TOC CM [phone]

## 2021-05-16 NOTE — Progress Notes (Signed)
Physical Therapy Treatment Patient Details Name: Jeffrey [name] MRN: [id number] DOB: Aug 31, 1976 Today's Date: 05/16/2021    History of Present Illness 45yo admitted with nausea and weight gain 7/26. Pt with Afib with RVR leading to CHF exacerbation, cardiogenic shock and respiratory failure. 7/27 cardioversion with intubation, extubated 8/4. 8/6 cardioversion. Hospital course complicated by gout in B feet and R elbow. 8/9 cardiac cath. PMhx: poorly controlled HTN, CKD stage 3a, PAF, DM2, morbid obesity, chronic systolic CHF, NICM s/p S-ICD, right renal cell CA s/p nephrectomy. (Simultaneous filing. User may not have seen previous data.)    PT Comments    Pt tolerated treatment well with VSS throughout. Pt required decreased assistance level with transfers compared to previous session and demonstrated increased ambulation distance with RW during session, with improvements in activity tolerance. Updated d/c recommendation to reflect recent changes with d/c plan.   Follow Up Recommendations  CIR;SNF (If CIR denies then SNF)     Equipment Recommendations  Rolling walker with 5" wheels;3in1 (PT) (bariatric)    Recommendations for Other Services       Precautions / Restrictions Precautions Precautions: Fall Precaution Comments: no lifting post cath Restrictions Weight Bearing Restrictions: No    Mobility  Bed Mobility Overal bed mobility: Needs Assistance Bed Mobility: Supine to Sit     Supine to sit: Supervision;HOB elevated     General bed mobility comments: Supervision with supine to sit with assist from bedrails    Transfers Overall transfer level: Needs assistance Equipment used: Rolling walker (2 wheeled) Transfers: Sit to/from Stand Sit to Stand: Supervision         General transfer comment: Performed sit to stand x4 with supervision for safety. Pt demonstated great recall of cues provided for hand placement during previous session.  Ambulation/Gait Ambulation/Gait  assistance: Min guard;+2 safety/equipment Gait Distance (Feet): 50 Feet Assistive device: Rolling walker (2 wheeled) Gait Pattern/deviations: Step-through pattern;Decreased stride length;Wide base of support Gait velocity: Reduced Gait velocity interpretation: 1.31 - 2.62 ft/sec, indicative of limited community ambulator General Gait Details: Pt ambulated 45, 20, 10, then 40f with min guard +2 for close chair follow and seated rest breaks in between. Verbal cueing provided for postural adjustments.   Stairs             Wheelchair Mobility    Modified Rankin (Stroke Patients Only)       Balance Overall balance assessment: Needs assistance Sitting-balance support: No upper extremity supported;Feet unsupported Sitting balance-Leahy Scale: Good     Standing balance support: Bilateral upper extremity supported Standing balance-Leahy Scale: Poor Standing balance comment: Relies of BUE support of RW                            Cognition                                              Exercises General Exercises - Lower Extremity Long Arc Quad: AROM;Both;Seated;20 reps Hip Flexion/Marching: AROM;Both;Seated;10 reps    General Comments General comments (skin integrity, edema, etc.): VSS on RA      Pertinent Vitals/Pain Pain Score: 7  Pain Location: bil knees Pain Descriptors / Indicators: Aching;Guarding Pain Intervention(s): Monitored during session;Limited activity within patient's tolerance;Repositioned    Home Living  Prior Function            PT Goals (current goals can now be found in the care plan section) Progress towards PT goals: Progressing toward goals    Frequency    Min 3X/week      PT Plan Discharge plan needs to be updated    Co-evaluation              AM-PAC PT "6 Clicks" Mobility   Outcome Measure  Help needed turning from your back to your side while in a flat bed  without using bedrails?: A Little Help needed moving from lying on your back to sitting on the side of a flat bed without using bedrails?: A Little Help needed moving to and from a bed to a chair (including a wheelchair)?: A Little Help needed standing up from a chair using your arms (e.g., wheelchair or bedside chair)?: A Little Help needed to walk in hospital room?: A Little Help needed climbing 3-5 steps with a railing? : Total 6 Click Score: 16    End of Session Equipment Utilized During Treatment: Gait belt Activity Tolerance: Patient tolerated treatment well Patient left: in chair;with call [place]/phone within reach;with chair alarm set Nurse Communication: Mobility status PT Visit Diagnosis: Other abnormalities of gait and mobility (R26.89);Difficulty in walking, not elsewhere classified (R26.2);Muscle weakness (generalized) (M62.81)     Time: [id number] PT Time Calculation (min) (ACUTE ONLY): 27 min  Charges:  $Gait Training: 8-22 mins $Therapeutic Activity: 8-22 mins                     Jeffrey [name], SPT Acute Rehab: [phone]     Jeffrey [name] 05/16/2021, 12:40 PM

## 2021-05-16 NOTE — PMR Pre-admission (Signed)
PMR Admission Coordinator Pre-Admission Assessment  Patient: Jeffrey [name] is an 45 y.o., male MRN: [id number] DOB: 03/01/1976 Height: 5' 8" (172.7 cm) Weight: (!) 136.3 kg  Insurance Information HMO:     PPO:      PCP:      IPA:      80/20:      OTHER:  PRIMARY: BCBS [place] Medicaid      Policy#: [id number]      Subscriber: patient CM Name: Cristal Generous      Phone#: [phone] ext [phone]       Fax#: [phone] (has epic access) Pre-Cert#: [id number]      Employer: Not employed Benefits:  Phone #:  [phone]    Name: Availity portal Eff. Date: 04/04/20     Deduct: $0      Out of Pocket Max: $0      Life Max: N/A CIR: 100%      SNF: 100% with medical necessity Outpatient: 27 visits combined     Co-Pay: $3 copay Home Health: 100%      Co-Pay: none DME: 100% with medical necessity     Co-Pay: none Providers: in network  SECONDARY:        Policy#:       Phone#:    Development worker, community:        Phone#:    The Engineer, petroleum" for patients in Inpatient Rehabilitation Facilities with attached "Privacy Act [place] Records" was provided and verbally reviewed with: N/A  Emergency Contact Information Contact Information     Name Relation Home Work [place] Spouse   [phone]   Henry,Faith Mother   [phone]       Current Medical History  Patient Admitting Diagnosis: Debility  History of Present Illness: A 45yo AAM with a hx poorly controlled HTN, CKD stage 3a, PAF, DM2, morbid obesity, chronic systolic CHF, NIDM s/p BSci Emblem MRI S-ICD.  He also has a hx of right renal cell CA s/p nephrectomy.  He is followed by EP and AHF.  He was in his USOH until this past Saturday and started to feel bad.  He has been compliant with his CPAP.  He admits to not taking any of his meds on Saturday because he was nauseated and did not take them this am either.  He does not weight himself daily so not sure if he has gained any weight.  He says that he started feeling SOB and felt like his heart was racing.  His sx persisted over the weekend and today he decided to come to the ER.     In ER was found to be in afib with RVR in the 160's with frequent runs of NSVT.  He denies any chest pain or pressure and complains of PND, orthopnea and DOE.  CPAP does help his SOB.  He denies any shocks from his device.  Na 137, K+ 3.6, SCr 1.99 (baseline 1.49 in April 22), BNp 1407, hsTrop 75>64 and WBC 9.6.  Cxray showed mild CHF.  EKG showed atrial fibrillation with RVR and wide complex tachy that may be aberration but also could be NSVT.  Cardiology is now asked to consult.  On 7/27 underwent cardioversion with intubation, extubated 8/4. Hospital course complicated by gout in B feet and R elbow.  PT/OT evaluations completed with recommendations for acute inpatient rehab admission.  Patient's medical record from [facility] has been reviewed by the rehabilitation admission coordinator and physician.  Past Medical  History  Past Medical History:  Diagnosis Date   Anxiety    Aspirin allergy    Childhood asthma    Chronic systolic CHF (congestive heart failure) ([place])    a. EF 20-25% in 2012. b. EF 45-50% in 10/2011 with nonischemic nuc - presumed NICM. c. 12/2014 Echo: Sev depressed LV fxn, sev dil LV, mild LVH, mild MR, sev dil LA, mildly reduced RV fxn.   CKD (chronic kidney disease) stage 2, GFR 60-89 ml/min    H/O vasectomy 12/2019   High cholesterol    Hypertension    Morbid obesity ([place])    Nephrolithiasis    OSA on CPAP    Paroxysmal atrial fibrillation ([place])    Presumed NICM    a. 04/2014 Myoview: EF 26%, glob HK, sev glob HK, ? prior infarct;  b. Never cathed 2/2 CKD.   Renal cell carcinoma ([place])    a. s/p Rt robotic assisted partial converted to radical nephrectomy on 01/2013.   Troponin level elevated    a. 04/2014, 12/2014: felt due to CHF.   Type II diabetes mellitus (HCC)    Ventricular tachycardia (HCC)    a. appropriate ICD  therapy 12/2017    Family History   family history includes Diabetes in his maternal aunt; Hypertension in his mother.  Prior Rehab/Hospitalizations Has the patient had prior rehab or hospitalizations prior to admission? Yes  Has the patient had major surgery during 100 days prior to admission? Yes   Current Medications  Current Facility-Administered Medications:    0.9 %  sodium chloride infusion, 250 mL, Intravenous, PRN, Bensimhon, Shaune Pascal, MD, Stopped at 04/30/21 2254   acetaminophen (TYLENOL) tablet 650 mg, 650 mg, Oral, Q6H PRN, Bensimhon, Shaune Pascal, MD, 650 mg at 05/09/21 1612   albuterol (PROVENTIL) (2.5 MG/3ML) 0.083% nebulizer solution 2.5 mg, 2.5 mg, Nebulization, Once, Bensimhon, Shaune Pascal, MD   allopurinol (ZYLOPRIM) tablet 100 mg, 100 mg, Oral, Daily, Bensimhon, Shaune Pascal, MD, 100 mg at 05/22/21 2505   ALPRAZolam Duanne Moron) tablet 0.5 mg, 0.5 mg, Oral, BID, Bensimhon, Shaune Pascal, MD, 0.5 mg at 05/22/21 0818   amiodarone (PACERONE) tablet 200 mg, 200 mg, Oral, BID, Bensimhon, Shaune Pascal, MD, 200 mg at 05/22/21 3976   apixaban (ELIQUIS) tablet 5 mg, 5 mg, Oral, BID, Bensimhon, Shaune Pascal, MD, 5 mg at 05/22/21 0818   Chlorhexidine Gluconate Cloth 2 % PADS 6 each, 6 each, Topical, Daily, Bensimhon, Shaune Pascal, MD, 6 each at 05/21/21 1728   docusate sodium (COLACE) capsule 100 mg, 100 mg, Oral, BID, Bensimhon, Shaune Pascal, MD, 100 mg at 05/22/21 0818   empagliflozin (JARDIANCE) tablet 10 mg, 10 mg, Oral, Daily, Bensimhon, Shaune Pascal, MD, 10 mg at 05/22/21 0819   insulin aspart (novoLOG) injection 0-20 Units, 0-20 Units, Subcutaneous, TID WC, Bensimhon, Shaune Pascal, MD, 4 Units at 05/22/21 0640   insulin aspart (novoLOG) injection 0-5 Units, 0-5 Units, Subcutaneous, QHS, Bensimhon, Shaune Pascal, MD, 2 Units at 05/13/21 2139   insulin aspart (novoLOG) injection 6 Units, 6 Units, Subcutaneous, TID WC, Joette Catching, PA-C, 6 Units at 05/22/21 7341   insulin detemir (LEVEMIR) injection 50 Units, 50  Units, Subcutaneous, BID, Bensimhon, Shaune Pascal, MD, 50 Units at 05/22/21 9379   losartan (COZAAR) tablet 25 mg, 25 mg, Oral, QHS, Bensimhon, Shaune Pascal, MD, 25 mg at 05/21/21 2152   MEDLINE mouth rinse, 15 mL, Mouth Rinse, BID, Bensimhon, Shaune Pascal, MD, 15 mL at 05/21/21 2156   nitroGLYCERIN (NITROSTAT) SL tablet 0.4 mg, 0.4  mg, Sublingual, Q5 Min x 3 PRN, Bensimhon, Shaune Pascal, MD   ondansetron (ZOFRAN) injection 4 mg, 4 mg, Intravenous, Q6H PRN, Bensimhon, Shaune Pascal, MD   pantoprazole (PROTONIX) EC tablet 40 mg, 40 mg, Oral, Daily, Bensimhon, Shaune Pascal, MD, 40 mg at 05/22/21 0818   sertraline (ZOLOFT) tablet 25 mg, 25 mg, Oral, Daily, Joette Catching, PA-C, 25 mg at 05/22/21 3419   sodium chloride flush (NS) 0.9 % injection 10-40 mL, 10-40 mL, Intracatheter, Q12H, Bensimhon, Shaune Pascal, MD, 10 mL at 05/21/21 2153   sodium chloride flush (NS) 0.9 % injection 10-40 mL, 10-40 mL, Intracatheter, PRN, Bensimhon, Shaune Pascal, MD   sodium chloride flush (NS) 0.9 % injection 3 mL, 3 mL, Intravenous, Q12H, Bensimhon, Shaune Pascal, MD, 3 mL at 05/21/21 2153   sodium chloride flush (NS) 0.9 % injection 3 mL, 3 mL, Intravenous, PRN, Bensimhon, Shaune Pascal, MD   sorbitol 70 % solution 30 mL, 30 mL, Per Tube, Daily PRN, Bensimhon, Shaune Pascal, MD, 30 mL at 05/07/21 6222   spironolactone (ALDACTONE) tablet 25 mg, 25 mg, Oral, Daily, Bensimhon, Shaune Pascal, MD, 25 mg at 05/22/21 0818   traMADol (ULTRAM) tablet 100 mg, 100 mg, Oral, Q12H PRN, Bensimhon, Shaune Pascal, MD, 100 mg at 05/19/21 2238   traZODone (DESYREL) tablet 100 mg, 100 mg, Oral, QHS, Bensimhon, Shaune Pascal, MD, 100 mg at 05/21/21 2152  Patients Current Diet:  Diet Order             Diet - low sodium heart healthy           Diet Carb Modified Fluid consistency: Thin; Room service appropriate? Yes  Diet effective now                   Precautions / Restrictions Precautions Precautions: Fall Precaution Comments: B knee pain Restrictions Weight Bearing  Restrictions: No   Has the patient had 2 or more falls or a fall with injury in the past year? No  Prior Activity Level Limited Community (1-2x/wk): Went out about 3 times a week.  Prior Functional Level Self Care: Did the patient need help bathing, dressing, using the toilet or eating? Independent  Indoor Mobility: Did the patient need assistance with walking from room to room (with or without device)? Independent  Stairs: Did the patient need assistance with internal or external stairs (with or without device)? Independent  Functional Cognition: Did the patient need help planning regular tasks such as shopping or remembering to take medications? Independent  Home Assistive Devices / Equipment Home Equipment: None  Prior Device Use: Indicate devices/aids used by the patient prior to current illness, exacerbation or injury? None of the above  Current Functional Level Cognition  Overall Cognitive Status: Within Functional Limits for tasks assessed Orientation Level: Oriented X4 General Comments: pleasant, cooperative    Extremity Assessment (includes Sensation/Coordination)  Upper Extremity Assessment: Overall WFL for tasks assessed RUE Deficits / Details: gout flare is resolved, denies pain, full AROM LUE Deficits / Details: grossly 3/5 shoulder flexion and elbow flexion  Lower Extremity Assessment: Defer to PT evaluation RLE Deficits / Details: only able to bend knee 45 degrees due to edema and pain, 2/5 knee extension and flexion LLE Deficits / Details: grossly 60 degrees knee flexion with 3/5 knee extension and 2/5 knee flexion    ADLs  Overall ADL's : Needs assistance/impaired Eating/Feeding: Independent, Bed level Grooming: Modified independent, Sitting, Wash/dry hands Upper Body Bathing: Minimal assistance, Sitting Lower Body Bathing: Maximal assistance,  Sit to/from stand Upper Body Dressing : Set up, Sitting Lower Body Dressing: Minimal assistance, Sit to/from stand,  With adaptive equipment Lower Body Dressing Details (indicate cue type and reason): pt able to bend to feet to manage sock but noted increased difficulty breathing and safety concerns with leaning forward. Educated on use of Reacher and sock aide with pt able to return demo with Min A. Toilet Transfer: Min guard, Ambulation, RW, Regular Toilet, Personal assistant Details (indicate cue type and reason): increased effort for power up and lowering down to regular toilet, use of grab bars Toileting- Clothing Manipulation and Hygiene: Supervision/safety, Sit to/from stand, Sitting/lateral lean Toileting - Clothing Manipulation Details (indicate cue type and reason): able to perform hygiene, clothing mgmt with increased time Functional mobility during ADLs: Min guard, Rolling walker General ADL Comments: Pt progressing well towards OT goals, limited by chronic knee pain. Discussed ADL routine and home setup with pt reporting need for shower chair (educated on pain mgmt and energy conservation benefits) and RW for stability/pain mgmt with mobility    Mobility  Overal bed mobility: Needs Assistance Bed Mobility: Supine to Sit Rolling: Min assist Sidelying to sit: Min assist, +2 for physical assistance Supine to sit: Modified independent (Device/Increase time) Sit to supine: Supervision General bed mobility comments: increased time/effort    Transfers  Overall transfer level: Needs assistance Equipment used: 4-wheeled walker Transfer via Lift Equipment: Stedy Transfers: Sit to/from Stand Sit to Stand: Supervision, Min guard General transfer comment: from low bed to rollator with Supervision but min guard for stand>sit due to fatigue/decreased safety technique.    Ambulation / Gait / Stairs / Wheelchair Mobility  Ambulation/Gait Ambulation/Gait assistance: Min guard, +2 safety/equipment Gait Distance (Feet): 140 Feet (52f x2 with standing break between (1067ftotal), seated break, then  7586f2 with standing break between (140f63ftal)) Assistive device: 4-wheeled walker Gait Pattern/deviations: Step-through pattern, Decreased stride length, Wide base of support General Gait Details: Pt with low steps, somewhat stiff-knee gait pattern due to B knee pain but improved knee flexion as distance progressed, no buckling this date but remains quick to fatigue and increased work of breathing with exertion. Chair follow for safety and he needed 1 seated break ~3 minutes and 2 standing breaks ~30 sec each trial. SpO2 92% on RA with exertion and 99% on RA at rest. Needs cues for use of brakes with rollator and somewhat unsafe with stand>sit due to fatigue needing min guard for safety. Gait velocity: Reduced Gait velocity interpretation: 1.31 - 2.62 ft/sec, indicative of limited community ambulator    Posture / Balance Dynamic Sitting Balance Sitting balance - Comments: able to sit without UE support Balance Overall balance assessment: Needs assistance Sitting-balance support: No upper extremity supported, Feet unsupported Sitting balance-Leahy Scale: Good Sitting balance - Comments: able to sit without UE support Standing balance support: Bilateral upper extremity supported Standing balance-Leahy Scale: Poor Standing balance comment: Relies on BUE support of RW    Special needs/care consideration CPAP and Diabetic management yes   Previous Home Environment (from acute therapy documentation) Living Arrangements: Spouse/significant other, Children Available Help at Discharge: Family, Available 24 hours/day Type of Home: House Home Layout: One level Home Access: Stairs to enter, Ramped entrance Entrance Stairs-Rails: Right Entrance Stairs-Number of Steps: 5; ramp in back Bathroom Shower/Tub: WalkMultimedia programmerandard  Discharge Living Setting Plans for Discharge Living Setting: Patient's home, House, Lives with (comment) (Lives with wife and 2 children) Type of  Home at Discharge:  House Discharge Home Layout: One level Discharge Home Access: Stairs to enter Entrance Stairs-Rails: Right, Left, Can reach both Entrance Stairs-Number of Steps: 5-6 Discharge Bathroom Shower/Tub: Walk-in shower, Curtain Discharge Bathroom Toilet: Standard Discharge Bathroom Accessibility: Yes How Accessible: Accessible via walker  Social/Family/Support Systems Patient Roles: Spouse, Parent (Has a wife, 82 yo Dtr and 39 yo son.) Contact Information: Eduardo Osier - wife - [phone] Anticipated Caregiver: Wife, cousin, sister Ability/Limitations of Caregiver: Wife works as a Education officer, museum. Caregiver Availability: Intermittent (Says he can get cousin or sister to stay while wife works) Discharge Plan Discussed with Primary Caregiver: Yes Is Caregiver In Agreement with Plan?: Yes Does Caregiver/Family have Issues with Lodging/Transportation while Pt is in Rehab?: No  Goals Patient/Family Goal for Rehab: PT/OT supervision goals Expected length of stay: 12-14 days Pt/Family Agrees to Admission and willing to participate: Yes Program Orientation Provided & Reviewed with Pt/Caregiver Including Roles  & Responsibilities: Yes  Decrease burden of Care through IP rehab admission: N/A  Possible need for SNF placement upon discharge: Not anticipated  Patient Condition: I have reviewed medical records from [facility] health, spoken with CM, and patient and spouse. I met with patient at the bedside and discussed via phone for inpatient rehabilitation assessment.  Patient will benefit from ongoing PT and OT, can actively participate in 3 hours of therapy a day 5 days of the week, and can make measurable gains during the admission.  Patient will also benefit from the coordinated team approach during an Inpatient Acute Rehabilitation admission.  The patient will receive intensive therapy as well as Rehabilitation physician, nursing, social worker, and care management interventions.   Due to bladder management, bowel management, safety, skin/wound care, disease management, medication administration, pain management, and patient education the patient requires 24 hour a day rehabilitation nursing.  The patient is currently min assist with mobility and basic ADLs.  Discharge setting and therapy post discharge at home with home health is anticipated.  Patient has agreed to participate in the Acute Inpatient Rehabilitation Program and will admit today.  Preadmission Screen Completed By: Jodell Cipro with updates by Michel Santee, 05/22/2021 10:16 AM ______________________________________________________________________   Discussed status with Dr. Ranell Patrick on 05/22/21  at 10:16 AM  and received approval for admission today.  Admission Coordinator:  Michel Santee, PT, time 10:16 AM Sudie Grumbling 05/22/21    Assessment/Plan: Diagnosis: Cardiopulmonary Debility Does the need for close, 24 hr/day Medical supervision in concert with the patient's rehab needs make it unreasonable for this patient to be served in a less intensive setting? Yes Co-Morbidities requiring supervision/potential complications: obesity (BMI 45.69), acute renal failure imposed on stage 3 chronic kidney disease, atrial fibrillation with RVR, acute hypoxemic respiratory failure, cardiogenic shock Due to bladder management, bowel management, safety, skin/wound care, disease management, medication administration, pain management, and patient education, does the patient require 24 hr/day rehab nursing? Yes Does the patient require coordinated care of a physician, rehab nurse, PT, OT to address physical and functional deficits in the context of the above medical diagnosis(es)? Yes Addressing deficits in the following areas: balance, endurance, locomotion, strength, transferring, bowel/bladder control, bathing, dressing, feeding, grooming, toileting, and psychosocial support Can the patient actively participate in an intensive  therapy program of at least 3 hrs of therapy 5 days a week? Yes The potential for patient to make measurable gains while on inpatient rehab is excellent Anticipated functional outcomes upon discharge from inpatient rehab: modified independent PT, modified independent OT, independent SLP Estimated rehab length of stay  to reach the above functional goals is: 5-7 days Anticipated discharge destination: Home 10. Overall Rehab/Functional Prognosis: excellent   MD Signature: Leeroy Cha, MD

## 2021-05-16 NOTE — TOC CM/SW Note (Signed)
HF TOC CM contacted CIR RN Coordinator, Genie to discuss Peer to Peer. Information sent to attending for P2P, MD will call, [phone] EXT [phone]. Will update patient. [place], Heart Failure TOC CM [phone]

## 2021-05-16 NOTE — NC FL2 (Signed)
[place] LEVEL OF CARE SCREENING TOOL     IDENTIFICATION  Patient Name: Jeffrey [name] Birthdate: Feb 06, 1976 Sex: male Admission Date (Current Location): 04/29/2021  [facility] and [place] Number:  Herbalist and Address:  [facility] [place] [place] [address] [place], [place] [address]      Provider Number: [id number]  Attending Physician Name and Address:  Larey Dresser, MD  Relative Name and Phone Number:  Elnora Morrison   [phone]    Current Level of Care: Hospital Recommended Level of Care: [place] Prior Approval Number:    Date Approved/Denied:   PASRR Number: [id number]  Discharge Plan: SNF    Current Diagnoses: Patient Active Problem List   Diagnosis Date Noted   Cardiogenic shock [facility])    Atrial fibrillation with RVR ([place]) 04/30/2021   Acute hypoxemic respiratory failure ([place])    Right wrist pain 04/16/2021   Absolute anemia    Rectal bleeding    Iron deficiency 12/25/2020   Long term current use of anticoagulant - apixaban 12/25/2020   Paroxysmal atrial fibrillation (HCC)    Lactic acidosis 11/22/2020   Hypotension 11/22/2020   Aspiration into airway    ICD (implantable cardioverter-defibrillator) battery depletion 05/15/2020   Endotracheal tube present    Respiratory failure ([place])    Acute encephalopathy    Acute pulmonary edema ([place])    NICM (nonischemic cardiomyopathy) ([place]) 04/24/2020   S/P vasectomy 12/20/2019   Anxiety 12/20/2019   Hemoglobin A1C between 7% and 9% indicating borderline diabetic control 12/20/2019   Seasonal allergies 12/20/2019   Insomnia 12/20/2019   Vasectomy evaluation 06/20/2019   Visual problems 06/20/2019   Blurry vision, bilateral 06/20/2019   Hyperglycemia 02/13/2019   Hyperosmolar (nonketotic) coma ([place]) 01/19/2019   AICD (automatic cardioverter/defibrillator) present 01/19/2019   Hyperlipidemia 12/07/2018   Follow-up exam 11/22/2018   Class 3  severe obesity due to excess calories with serious comorbidity and body mass index (BMI) of 45.0 to 49.9 in adult ([place]) 11/22/2018   Dizziness 11/22/2018   Lingular pneumonia 08/04/2018   Ventricular tachycardia ([place]) 12/30/2017   PAF (paroxysmal atrial fibrillation) ([place])    Dilated cardiomyopathy ([place])    Pollen allergy 02/17/2017   Dyslipidemia 02/17/2017   Hematochezia 11/08/2015   CKD (chronic kidney disease), stage III ([place]) 06/20/2015   Cardiac defibrillator in situ    Chronic systolic CHF (congestive heart failure) ([place]) 03/11/2015   Gouty arthritis 03/11/2015   Chest pain 12/28/2014   Acute renal failure superimposed on stage 3 chronic kidney disease ([place]) 04/08/2014   Aspirin allergy 04/08/2014   Renal cell carcinoma ([place])    Gout attack 11/10/2011   Morbid obesity ([place]) 10/23/2011   OSA (obstructive sleep apnea) 10/23/2011   Type 2 diabetes mellitus with stage 3 chronic kidney disease, with long-term current use of insulin ([place]) 10/22/2011   Hyperlipidemia associated with type 2 diabetes mellitus ([place]) 12/25/2010   Hypertension associated with diabetes ([place]) 12/25/2010    Orientation RESPIRATION BLADDER Height & Weight     Self, Time, Situation, Place  Normal Continent, External catheter Weight: (!) 307 lb 1.6 oz (139.3 kg) Height:  '5\' 8"'$  (172.7 cm)  BEHAVIORAL SYMPTOMS/MOOD NEUROLOGICAL BOWEL NUTRITION STATUS      Continent Diet (Please see DC Summary)  AMBULATORY STATUS COMMUNICATION OF NEEDS Skin   Extensive Assist Verbally Normal  Personal Care Assistance Level of Assistance  Bathing, Feeding, Dressing Bathing Assistance: Limited assistance Feeding assistance: Independent Dressing Assistance: Maximum assistance     Functional Limitations Info  Sight, Hearing, Speech Sight Info: Adequate Hearing Info: Adequate Speech Info: Adequate    SPECIAL CARE FACTORS FREQUENCY  PT (By licensed PT), OT (By licensed OT)     PT Frequency:  5x/week OT Frequency: 5x/week            Contractures Contractures Info: Not present    Additional Factors Info  Code Status, Allergies, Insulin Sliding Scale Code Status Info: Full Allergies Info: Aspirin, Bee Venom, Lisinopril, Tomato   Insulin Sliding Scale Info: Please See Medication List       Current Medications (05/16/2021):  This is the current hospital active medication list Current Facility-Administered Medications  Medication Dose Route Frequency Provider Last Rate Last Admin   0.9 %  sodium chloride infusion  250 mL Intravenous PRN Bensimhon, Shaune Pascal, MD   Stopped at 04/30/21 2254   0.9 %  sodium chloride infusion  250 mL Intravenous PRN Bensimhon, Shaune Pascal, MD       acetaminophen (TYLENOL) tablet 650 mg  650 mg Oral Q6H PRN Bensimhon, Shaune Pascal, MD   650 mg at 05/09/21 1612   acetaminophen (TYLENOL) tablet 650 mg  650 mg Oral Q4H PRN Bensimhon, Shaune Pascal, MD       albuterol (PROVENTIL) (2.5 MG/3ML) 0.083% nebulizer solution 2.5 mg  2.5 mg Nebulization Once Bensimhon, Shaune Pascal, MD       allopurinol (ZYLOPRIM) tablet 100 mg  100 mg Oral Daily Bensimhon, Shaune Pascal, MD   100 mg at 05/15/21 1009   ALPRAZolam (XANAX) tablet 0.5 mg  0.5 mg Oral BID Bensimhon, Shaune Pascal, MD   0.5 mg at 05/15/21 2254   amiodarone (PACERONE) tablet 200 mg  200 mg Oral BID Bensimhon, Shaune Pascal, MD   200 mg at 05/15/21 2253   apixaban (ELIQUIS) tablet 5 mg  5 mg Oral BID Bensimhon, Shaune Pascal, MD   5 mg at 05/15/21 2253   Chlorhexidine Gluconate Cloth 2 % PADS 6 each  6 each Topical Daily Bensimhon, Shaune Pascal, MD   6 each at 05/15/21 1736   docusate sodium (COLACE) capsule 100 mg  100 mg Oral BID Bensimhon, Shaune Pascal, MD   100 mg at 05/14/21 2119   empagliflozin (JARDIANCE) tablet 10 mg  10 mg Oral Daily Bensimhon, Shaune Pascal, MD   10 mg at 05/15/21 1008   insulin aspart (novoLOG) injection 0-20 Units  0-20 Units Subcutaneous TID WC Bensimhon, Shaune Pascal, MD   4 Units at 05/15/21 1714   insulin aspart  (novoLOG) injection 0-5 Units  0-5 Units Subcutaneous QHS Bensimhon, Shaune Pascal, MD   2 Units at 05/13/21 2139   insulin aspart (novoLOG) injection 6 Units  6 Units Subcutaneous TID WC Joette Catching, PA-C   6 Units at 05/15/21 1714   insulin detemir (LEVEMIR) injection 50 Units  50 Units Subcutaneous BID Bensimhon, Shaune Pascal, MD   50 Units at 05/15/21 2254   isosorbide-hydrALAZINE (BIDIL) 20-37.5 MG per tablet 0.5 tablet  0.5 tablet Oral TID Joette Catching, PA-C       MEDLINE mouth rinse  15 mL Mouth Rinse BID Bensimhon, Shaune Pascal, MD   15 mL at 05/15/21 2256   nitroGLYCERIN (NITROSTAT) SL tablet 0.4 mg  0.4 mg Sublingual Q5 Min x 3 PRN Bensimhon, Shaune Pascal, MD       ondansetron [facility])  injection 4 mg  4 mg Intravenous Q6H PRN Bensimhon, Shaune Pascal, MD       ondansetron [facility]) injection 4 mg  4 mg Intravenous Q6H PRN Bensimhon, Shaune Pascal, MD       pantoprazole (PROTONIX) EC tablet 40 mg  40 mg Oral Daily Bensimhon, Shaune Pascal, MD   40 mg at 05/15/21 1008   protein supplement (ENSURE MAX) liquid  11 oz Oral BID Bensimhon, Shaune Pascal, MD   11 oz at 05/15/21 1015   sacubitril-valsartan (ENTRESTO) 24-26 mg per tablet  1 tablet Oral BID Bensimhon, Shaune Pascal, MD   1 tablet at 05/15/21 2253   sertraline (ZOLOFT) tablet 25 mg  25 mg Oral Daily Joette Catching, PA-C       sodium chloride flush (NS) 0.9 % injection 10-40 mL  10-40 mL Intracatheter Q12H Bensimhon, Shaune Pascal, MD   10 mL at 05/15/21 2254   sodium chloride flush (NS) 0.9 % injection 10-40 mL  10-40 mL Intracatheter PRN Bensimhon, Shaune Pascal, MD       sodium chloride flush (NS) 0.9 % injection 3 mL  3 mL Intravenous Q12H Bensimhon, Shaune Pascal, MD   3 mL at 05/15/21 1012   sodium chloride flush (NS) 0.9 % injection 3 mL  3 mL Intravenous PRN Bensimhon, Shaune Pascal, MD       sodium chloride flush (NS) 0.9 % injection 3 mL  3 mL Intravenous Q12H Bensimhon, Shaune Pascal, MD   3 mL at 05/15/21 2255   sodium chloride flush (NS) 0.9 % injection 3 mL  3 mL  Intravenous PRN Bensimhon, Shaune Pascal, MD       sorbitol 70 % solution 30 mL  30 mL Per Tube Daily PRN Bensimhon, Shaune Pascal, MD   30 mL at 05/07/21 0946   spironolactone (ALDACTONE) tablet 25 mg  25 mg Oral Daily Bensimhon, Shaune Pascal, MD   25 mg at 05/15/21 1008   traMADol (ULTRAM) tablet 100 mg  100 mg Oral Q12H PRN Bensimhon, Shaune Pascal, MD   100 mg at 05/15/21 1328   traZODone (DESYREL) tablet 100 mg  100 mg Oral QHS Bensimhon, Shaune Pascal, MD   100 mg at 05/15/21 2253     Discharge Medications: Please see discharge summary for a list of discharge medications.  Relevant Imaging Results:  Relevant Lab Results:   Additional Information SSN# [id number]  Miliani Deike, LCSWA

## 2021-05-16 NOTE — TOC Progression Note (Addendum)
Transition of Care (TOC) - Progression Note  Heart Failure  Patient Details  Name: Jeffrey [name] MRN: [id number] Date of Birth: 04/06/1976  Transition of Care [facility]) CM/SW [place], [place] Phone Number: 05/16/2021, 3:57 PM  Clinical Narrative:    MD and RNCM have attempted to call for Peer to Peer without any success and left several messages but have not been able to speak with anyone about the CIR denial.  CSW [facility] [phone] SNF and they will start the insurance authorization for the patient and will let the CSW know once that returns and to call Monday to follow up. CSW spoke with the patients spouse over the phone [phone] and she is agreeable for her husband to get rehab at the SNF. CSW spoke with [name] at bedside about still not hearing back about the peer to peer for CIR and discussed other options. [name] reported that he knows he isn't ready to return home yet and is agreeable to go to SNF if CIR is denied.   CSW will continue to follow throughout discharge.  Expected Discharge Plan: [place] Barriers to Discharge: Continued Medical Work up  Expected Discharge Plan and Services Expected Discharge Plan: [place] In-house Referral: Clinical Social Work Discharge Planning Services: CM Consult Post Acute Care Choice: [place] arrangements for the past 2 months: Single Family Home                 DME Arranged: 3-N-1, Walker rolling with seat, Shower stool DME Agency: [employer]                   Social Determinants of Health (SDOH) Interventions Food Insecurity Interventions: Assist with [employer] Application Financial Strain Interventions: Other (Comment) (Referral to [facility] for Disability and Food Stamps) Housing Interventions: Intervention Not Indicated Transportation Interventions: [employer]  Readmission Risk Interventions No flowsheet data  found.  Yuriko Portales, MSW, [place] Heart Failure Social Worker

## 2021-05-16 NOTE — Progress Notes (Signed)
Patient's SBP dropped to the low to mid 80's with a MAP ranging from 69 to 78. HR 70-90's. Patient is asymptomatic with good peripheral perfusion. CVP 8, 7 and 2 overnight. Cardiologist paged.

## 2021-05-16 NOTE — Plan of Care (Signed)

## 2021-05-16 NOTE — Progress Notes (Addendum)
Patient ID: Jeffrey [name], male   DOB: 1976-04-25, 45 y.o.   MRN: [id number]     Advanced Heart Failure Rounding Note  PCP-Cardiologist: Glori Bickers, MD   Subjective:    04/30/21 Echo: EF < 20%, severe LV dilation, moderate RV dysfunction 05/05/21 Febrile to 101.2. Started on zyvox+ cefepime 05/05/21 Sputum Cx: rare staph aureus   8/3: Linezolid and cefepime switched to Ancef 8/5: Afib with RVR. Received amio bolus/load.  EP consulted  8/6 DC-CV  Co-ox 78% > 67% > 60% > 55% > 60%  Torsemide restarted yesterday. Bed weight up 5lb.   Remains in NSR.  Still feels week. Awaiting CIR approval.  Denies any further gout pain. Diarrhea with colchicine.  VAD w/u ongoing.    RHC, 05/13/21: RA = 6 RV = 29/3 PA = 35/12 (22) PCW = 16 Fick cardiac output/index = 5.5/2.3 PVR = 1.1 WU Ao sat = 98% PA sat = 65%, 67%    Objective:   Weight Range: (!) 141.6 kg Body mass index is 47.47 kg/m.   Vital Signs:   Temp:  [97.6 F (36.4 C)-98.4 F (36.9 C)] 98 F (36.7 C) (08/12 0400) Pulse Rate:  [73-98] 75 (08/12 0639) Resp:  [16-28] 19 (08/12 0639) BP: (81-99)/(60-87) 92/79 (08/12 0639) SpO2:  [89 %-97 %] 93 % (08/12 0639) Weight:  [141.6 kg] 141.6 kg (08/12 0633) Last BM Date: 05/15/21  Weight change: Filed Weights   05/14/21 0455 05/15/21 0600 05/16/21 0633  Weight: (!) 138.3 kg (!) 139.5 kg (!) 141.6 kg    Intake/Output:   Intake/Output Summary (Last 24 hours) at 05/16/2021 0708 Last data filed at 05/16/2021 [id number] Gross per 24 hour  Intake --  Output 3700 ml  Net -3700 ml      Physical Exam    CVP 4 General:  Well appearing, obese. No respiratory difficulty HEENT: normal + left IJ CVC  Neck: supple. Thick neck no JVD. Carotids 2+ bilat; no bruits. No lymphadenopathy or thyromegaly appreciated. Cor: PMI nondisplaced. Regular rate & rhythm. No rubs, gallops or murmurs. Lungs: CTA Abdomen: soft, nontender, nondistended. Obese. No hepatosplenomegaly. No bruits or  masses. Good bowel sounds. Extremities: no cyanosis, clubbing, rash, edema Neuro: alert & oriented x 3, cranial nerves grossly intact. moves all 4 extremities w/o difficulty. Affect pleasant.    Telemetry   Sinus 80s with PACs (personally reviewed)  Labs    CBC Recent Labs    05/15/21 0402 05/16/21 0446  WBC 8.7 7.6  HGB 12.9* 12.4*  HCT 40.2 39.6  MCV 84.8 85.0  PLT 446* [id number]*   Basic Metabolic Panel Recent Labs    05/15/21 0402 05/16/21 0446  NA 137 136  K 3.6 4.1  CL 106 107  CO2 25 23  GLUCOSE 125* 151*  BUN 43* 38*  CREATININE 1.66* 1.62*  CALCIUM 9.5 9.0  MG 2.0  --    Liver Function Tests No results for input(s): AST, ALT, ALKPHOS, BILITOT, PROT, ALBUMIN in the last 72 hours.  No results for input(s): LIPASE, AMYLASE in the last 72 hours. Cardiac Enzymes No results for input(s): CKTOTAL, CKMB, CKMBINDEX, TROPONINI in the last 72 hours.  BNP: BNP (last 3 results) Recent Labs    11/21/20 2222 04/29/21 1247  BNP 65.0 1,407.7*    ProBNP (last 3 results) No results for input(s): PROBNP in the last 8760 hours.   D-Dimer No results for input(s): DDIMER in the last 72 hours. Hemoglobin A1C No results for input(s): HGBA1C in the last  72 hours.  Fasting Lipid Panel No results for input(s): CHOL, HDL, LDLCALC, TRIG, CHOLHDL, LDLDIRECT in the last 72 hours.   Thyroid Function Tests No results for input(s): TSH, T4TOTAL, T3FREE, THYROIDAB in the last 72 hours.  Invalid input(s): FREET3   Other results:   Imaging    No results found.   Medications:     Scheduled Medications:  albuterol  2.5 mg Nebulization Once   allopurinol  100 mg Oral Daily   ALPRAZolam  0.5 mg Oral BID   amiodarone  200 mg Oral BID   apixaban  5 mg Oral BID   Chlorhexidine Gluconate Cloth  6 each Topical Daily   colchicine  0.6 mg Oral BID   docusate sodium  100 mg Oral BID   empagliflozin  10 mg Oral Daily   insulin aspart  0-20 Units Subcutaneous TID WC    insulin aspart  0-5 Units Subcutaneous QHS   insulin aspart  6 Units Subcutaneous TID WC   insulin detemir  50 Units Subcutaneous BID   isosorbide-hydrALAZINE  1 tablet Oral TID   mouth rinse  15 mL Mouth Rinse BID   pantoprazole  40 mg Oral Daily   potassium chloride  40 mEq Oral Daily   Ensure Max Protein  11 oz Oral BID   sacubitril-valsartan  1 tablet Oral BID   sodium chloride flush  10-40 mL Intracatheter Q12H   sodium chloride flush  3 mL Intravenous Q12H   sodium chloride flush  3 mL Intravenous Q12H   spironolactone  25 mg Oral Daily   torsemide  20 mg Oral Daily   traZODone  100 mg Oral QHS    Infusions:  sodium chloride Stopped (04/30/21 2254)   sodium chloride      PRN Medications: sodium chloride, sodium chloride, acetaminophen, acetaminophen, nitroGLYCERIN, ondansetron (ZOFRAN) IV, ondansetron (ZOFRAN) IV, sodium chloride flush, sodium chloride flush, sodium chloride flush, sorbitol, traMADol  Assessment/Plan   1. Atrial fibrillation: Paroxysmal - Suspect AF with RVR drove initial CHF exacerbation. Emergent TEE-guided DCCV 7/27 due to inability to control HR - Recurrent AF on 8/5. s/p DC-CV on 05/10/21. Remains in NSR now w/ PACs - Seen by EP. Not a candidate for ablation due to body habitus  (could consider AVN ablation and CRT, if needed)  - Amiodarone initially stopped due to concern for toxicity, but resumed after reversion to rapid afib on 8/5. IV amio off, now on po 200 mg BID - Continue Eliquis.  - Keep K> 4 Mg > 2  2. 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  Echo in 9/21 with EF 20-25%.  Echo this admission with EF < 20%, severe LV dilation, moderate RV dysfunction.   - s/p S-ICD - 7/27: intubated with pulmonary edema and required addition of NE. Off NE.  Now extubated and off all pressors  - Dobutamine weaned off on 8/3, Co-ox 78% > 65% > 59.7% > 60% - RHC 08/09 with PCW 16, RA 6, Fick CI  2.3 CVP 4. On Torsemide 20 mg daily, hold today d/t low volume status and hypotension. ? Bed weight up 5 lb. Standing weight does not reflect weight gain.  - Hold bidil. SBP 80s-90s. - continue Entresto 24/26 bid - Continue spiro 25 mg daily - Continue jardiance 10 mg daily - No BB for now d/t recent shock - Monitor BP. May need to consider midodrine. - VAD work-up started.   3. Acute hypoxemic respiratory failure -  Resp culture + for rare S. Aureus (MSSA)  - Became febrile on 8/1 and started on linezolid and cefepime. Cefepime switched to Ancef and Linezolid discontinued on 8/3. Ancef stopped 8/7 - Initial concern for amiodarone toxicity -- amio stopped and steroids started, but steroids stopped the next morning on 8/4 -- per CCM team, more likely atelectasis driving hypoxia.  Deferred management to them  - Extubated on 8/4, initially to BiPAP and now on RA  4. AKI on CKD stage 3  - Has solitary kidney.   - Suspect cardiorenal syndrome/low output with AF/RVR in setting of cardiomyopathy.   - Baseline creatinine is 1.5-1.7, Creatinine peaked at 4.65. 1.62 today - Urinary obstruction likely played a role. Urology placed foley catheter with improvement in UOP. Foley removed due to concern for infection after fever developed - Avoid hypotension. Cutting back bidil as above.  5. OSA - CPAP   6. Hypernatremia  - resolved  7. Hypokalemia - Resolved - K 4.1  8. Acute gout - Has h/o gout . Uric acid 10.4 - Prednisone x3 days - Stop colchicine with improvement in gout and diarrhea - Continue allopurinol  9. DM2. Poorly controlled - Jardiance - continue SSI - Hgb A1c 9.4 - DM consulted, appreciate input  10. Leukocytosis - WBC 8.5 > 10.9 > 9.5>8.7 - ? If related to prednisone use.  - AF  11. Depression - Adding sertraline.   Discharge planning:  Awaiting CIR approval from insurance.  Medications to [facility]   F/u in HF clinic  scheduled    [facility], Lynder Parents, PA-C  05/16/2021, 7:08 AM  Advanced Heart Failure Team Pager [phone] (M-F; 7a - 5p)  Please contact [facility] for night-coverage after hours (5p -7a ) and weekends on [email or web address]  Patient seen and examined with the above-signed Advanced Practice Provider and/or Housestaff. I personally reviewed laboratory data, imaging studies and relevant notes. I independently examined the patient and formulated the important aspects of the plan. I have edited the note to reflect any of my changes or salient points. I have personally discussed the plan with the patient and/or family.  Denies CP or SOB. Gout pain resolved. Remains in NSR on po amio. Remains weak and unable to ambulate well on his own with very limited exercise tolerance   General:  Well appearing. No resp difficulty HEENT: normal Neck: supple. no JVD. Carotids 2+ bilat; no bruits. No lymphadenopathy or thryomegaly appreciated. Cor: PMI nondisplaced. Regular rate & rhythm. No rubs, gallops or murmurs. Lungs: clear Abdomen: obese soft, nontender, nondistended. No hepatosplenomegaly. No bruits or masses. Good bowel sounds. Extremities: no cyanosis, clubbing, rash, edema Neuro: alert & orientedx3, cranial nerves grossly intact. moves all 4 extremities w/o difficulty. Affect pleasant  HF much improved. Co-ox stable off inotropes. Remains in NSR. Main issue is severe debility. Remains quite weak and unable to ambulate independently. He appears to be a good candidate for Inpatient Rehab as he is quite motivated to improve and it will expedite our chances of getting him ready for VAD in a reasonable amount of time.   He was turned down for CIR. I called for a peer-to-peer to discuss this am but have yet to hear back.   Glori Bickers, MD  6:30 PM

## 2021-05-16 NOTE — Progress Notes (Signed)
[facility] IP rehab admissions - I have received a denial for CIR this morning from insurance case manager.  The reason stated is that patient's needs can be met at a lower level such as a SNF.  If attending MD would like to do a peer to peer review with Psychologist, occupational, please call [phone] [phone] as soon as feasible today.  Call me for questions at # [phone].

## 2021-05-16 NOTE — TOC Progression Note (Addendum)
Transition of Care (TOC) - Progression Note  Heart Failure   Patient Details  Name: Jeffrey [name] MRN: [id number] Date of Birth: 1976/09/20  Transition of Care [facility]) CM/SW [place], [place] Phone Number: 05/16/2021, 9:37 AM  Clinical Narrative:    CSW received consult for possible SNF placement at time of discharge due to the patient's insurance denying the CIR request. Awaiting results of peer to peer. CSW spoke with patient and the patient's spouse about the insurance denial for CIR and going to peer to peer and possible options for SNF or HH. Patient reported that patient's spouse is currently unable to care for patient at their home given patient's current physical needs and fall risk. Patient expressed understanding of PT recommendation and is agreeable to SNF placement at time of discharge depending on what happens with CIR. Patient reports no preference for SNF. CSW discussed the insurance authorization process and provided Medicare SNF ratings list. Patient has not received the COVID vaccines. Patient expressed being hopeful for rehab and to feel better soon. No further questions reported at this time.   Skilled Nursing Rehab Facilities-   [email or web address] *Ratings updated quarterly   Ratings out of 5 possible   Name Address  Phone # Dorris Inspection Overall  [facility] [address] [place] '5  4 4  '$ [facility]  [address] [place], [place] [phone] '4 2 4 4  '$ [facility] [place], [place] '2 3 1 1  '$ [place], [place] '3 3 4 4  '$ [facility] [address] [place] '3  2 2  '$ [place]. [address] [place] '2 2 4 4  '$ [facility] [address], [place] '5 2 2 3  '$ [facility] [address] [place] '4 2 2 2   '$ [place] [facility] [place] '5 2 2 3  '$ [facility] [phone], [place] [phone] '5 1 2 2  '$ [facility] [address] [facility] [phone] '5 2 2 3  '$ [place] [address] [id number] '3 1 1 1      '$ Expected Discharge Plan: [place] Barriers to Discharge: Continued Medical Work up  Expected Discharge Plan and Services Expected Discharge Plan: [place] In-house Referral: Clinical Social Work Discharge Planning Services: CM Consult Post Acute Care Choice: [place] arrangements for the past 2 months: Single Family Home                 DME Arranged: 3-N-1, Walker rolling with seat, Shower stool DME Agency: [employer]                   Social Determinants of Health (SDOH) Interventions Food Insecurity Interventions: Assist with [employer] Application Financial Strain Interventions: Other (Comment) (Referral to [facility] for Disability and Food Stamps) Housing Interventions: Intervention Not Indicated Transportation Interventions: [employer]  Readmission Risk Interventions No flowsheet data found.  Shogo Larkey, MSW, [place] Heart Failure Social Worker

## 2021-05-16 NOTE — Progress Notes (Signed)
CARDIAC REHAB PHASE I   PRE:  Rate/Rhythm: 93 SR with PACs    BP: lying 89/76    SaO2: 96 RA  MODE:  Ambulation: 120 ft total, (60 ft, 78f, 10 ft)   POST:  Rate/Rhythm: 114 ST with PAC, PVCs    BP: sitting 113/93     SaO2: 96 RA  Pt in bed, motivated to walk. Asked for bed to be raised higher so he could stand independently. Stood ind with effort and walked leaning over RW as his main c/o is left hip pain. Assist x2 with gait belt and chair follow. Sat and rested x2, fatigued. VSS. Return to bed. Pt motivated. 1False Pass ACSM 05/16/2021 3:14 PM

## 2021-05-17 DIAGNOSIS — N170 Acute kidney failure with tubular necrosis: Secondary | ICD-10-CM | POA: Diagnosis not present

## 2021-05-17 DIAGNOSIS — R570 Cardiogenic shock: Secondary | ICD-10-CM | POA: Diagnosis not present

## 2021-05-17 DIAGNOSIS — N1831 Chronic kidney disease, stage 3a: Secondary | ICD-10-CM | POA: Diagnosis not present

## 2021-05-17 DIAGNOSIS — J9601 Acute respiratory failure with hypoxia: Secondary | ICD-10-CM | POA: Diagnosis not present

## 2021-05-17 DIAGNOSIS — I4891 Unspecified atrial fibrillation: Secondary | ICD-10-CM | POA: Diagnosis not present

## 2021-05-17 DIAGNOSIS — N183 Chronic kidney disease, stage 3 unspecified: Secondary | ICD-10-CM | POA: Diagnosis not present

## 2021-05-17 LAB — BASIC METABOLIC PANEL
Anion gap: 7 (ref 5–15)
BUN: 28 mg/dL — ABNORMAL HIGH (ref 6–20)
CO2: 22 mmol/L (ref 22–32)
Calcium: 9.4 mg/dL (ref 8.9–10.3)
Chloride: 107 mmol/L (ref 98–111)
Creatinine, Ser: 1.48 mg/dL — ABNORMAL HIGH (ref 0.61–1.24)
GFR, Estimated: 59 mL/min — ABNORMAL LOW (ref >60)
Glucose, Bld: 138 mg/dL — ABNORMAL HIGH (ref 70–99)
Potassium: 3.8 mmol/L (ref 3.5–5.1)
Sodium: 136 mmol/L (ref 135–145)

## 2021-05-17 LAB — CBC
HCT: 40.8 % (ref 39.0–52.0)
Hemoglobin: 13 g/dL (ref 13.0–17.0)
MCH: 27.3 pg (ref 26.0–34.0)
MCHC: 31.9 g/dL (ref 30.0–36.0)
MCV: 85.7 fL (ref 80.0–100.0)
Platelets: 438 10*3/uL — ABNORMAL HIGH (ref 150–400)
RBC: 4.76 MIL/uL (ref 4.22–5.81)
RDW: 17 % — ABNORMAL HIGH (ref 11.5–15.5)
WBC: 7.4 10*3/uL (ref 4.0–10.5)
nRBC: 0 % (ref 0.0–0.2)

## 2021-05-17 LAB — COOXEMETRY PANEL
Carboxyhemoglobin: 0.8 % (ref 0.5–1.5)
Methemoglobin: 0.8 % (ref 0.0–1.5)
O2 Saturation: 66.3 %
Total hemoglobin: 13.2 g/dL (ref 12.0–16.0)

## 2021-05-17 LAB — GLUCOSE, CAPILLARY
Glucose-Capillary: 126 mg/dL — ABNORMAL HIGH (ref 70–99)
Glucose-Capillary: 145 mg/dL — ABNORMAL HIGH (ref 70–99)
Glucose-Capillary: 180 mg/dL — ABNORMAL HIGH (ref 70–99)
Glucose-Capillary: 123 mg/dL — ABNORMAL HIGH (ref 70–99)

## 2021-05-17 MED ORDER — POTASSIUM CHLORIDE CRYS ER 20 MEQ PO TBCR
20.0000 meq | EXTENDED_RELEASE_TABLET | Freq: Once | ORAL | Status: AC
  Administered 2021-05-17: 20 meq via ORAL
  Filled 2021-05-17: qty 1

## 2021-05-17 NOTE — Progress Notes (Signed)
Pt places himself on his home cpap as needed.  RT will monitor as necessary,

## 2021-05-17 NOTE — Progress Notes (Signed)
CARDIAC REHAB PHASE I   PRE:  Rate/Rhythm: 101/ST  BP:  Sitting: 101/76      SaO2: 95%  MODE:  Ambulation: 75 ft   POST:  Rate/Rhythm: 98/SR  BP:  Sitting: 104/77      SaO2: 91%  Pt received in bed, agrees to walk. Pt requires assistance x 2 persons. Pt ambulated with slow, guarded gait with walker. Pt has chronic knee problems which limit his mobility. Pt only required to sit in chair  x 1 during walk due to his knees. Pt back to bed with call [employer] and tray table in reach. Vitals WNL. Pt had no C/O dizziness.   Lesly Rubenstein, MS, ACSM EP-C, CCRP 05/17/2021  11:50- 12:35

## 2021-05-17 NOTE — Progress Notes (Signed)
Patient ID: Jeffrey [name], male   DOB: [birth year], 45 y.o.   MRN: [id number]     Advanced Heart Failure Rounding Note  PCP-Cardiologist: Glori Bickers, MD   Subjective:    Remains weak. Continues to need assistance with transfers but is motivated to get stronger. Able to ambulate with RW but tires easily.   Denies CP, orthopnea or PND. Remains in NSR. Co-ox 66% CVP 3    Objective:   Weight Range: (!) 142 kg Body mass index is 47.6 kg/m.   Vital Signs:   Temp:  [97 F (36.1 C)-98.2 F (36.8 C)] 97 F (36.1 C) (08/13 0700) Pulse Rate:  [69-176] 176 (08/13 0700) Resp:  [18-20] 20 (08/13 0700) BP: (83-114)/(66-85) 107/82 (08/13 0700) SpO2:  [93 %-98 %] 98 % (08/13 0700) Weight:  [142 kg] 142 kg (08/13 0500) Last BM Date: 05/16/21  Weight change: Filed Weights   05/16/21 0633 05/16/21 0851 05/17/21 0500  Weight: (!) 141.6 kg (!) 139.3 kg (!) 142 kg    Intake/Output:   Intake/Output Summary (Last 24 hours) at 05/17/2021 0927 Last data filed at 05/17/2021 0811 Gross per 24 hour  Intake 720 ml  Output 2026 ml  Net -1306 ml       Physical Exam   General:  Lying in bed. No resp difficulty HEENT: normal Neck: supple. no JVD. Carotids 2+ bilat; no bruits. No lymphadenopathy or thryomegaly appreciated. Cor: PMI nondisplaced. Regular rate & rhythm. No rubs, gallops or murmurs. Lungs: clear Abdomen: obese soft, nontender, nondistended. No hepatosplenomegaly. No bruits or masses. Good bowel sounds. Extremities: no cyanosis, clubbing, rash, edema Neuro: alert & orientedx3, cranial nerves grossly intact. moves all 4 extremities w/o difficulty. Affect pleasant    Telemetry   Sinus 80-90s Personally reviewed  Labs    CBC Recent Labs    05/16/21 0446 05/17/21 0426  WBC 7.6 7.4  HGB 12.4* 13.0  HCT 39.6 40.8  MCV 85.0 85.7  PLT 441* 438*    Basic Metabolic Panel Recent Labs    05/15/21 0402 05/16/21 0446 05/17/21 0426  NA 137 136 136  K 3.6 4.1 3.8   CL 106 107 107  CO2 '25 23 22  ' GLUCOSE 125* 151* 138*  BUN 43* 38* 28*  CREATININE 1.66* 1.62* 1.48*  CALCIUM 9.5 9.0 9.4  MG 2.0  --   --     Liver Function Tests No results for input(s): AST, ALT, ALKPHOS, BILITOT, PROT, ALBUMIN in the last 72 hours.  No results for input(s): LIPASE, AMYLASE in the last 72 hours. Cardiac Enzymes No results for input(s): CKTOTAL, CKMB, CKMBINDEX, TROPONINI in the last 72 hours.  BNP: BNP (last 3 results) Recent Labs    11/21/20 2222 04/29/21 1247  BNP 65.0 1,407.7*     ProBNP (last 3 results) No results for input(s): PROBNP in the last 8760 hours.   D-Dimer No results for input(s): DDIMER in the last 72 hours. Hemoglobin A1C No results for input(s): HGBA1C in the last 72 hours.  Fasting Lipid Panel No results for input(s): CHOL, HDL, LDLCALC, TRIG, CHOLHDL, LDLDIRECT in the last 72 hours.   Thyroid Function Tests No results for input(s): TSH, T4TOTAL, T3FREE, THYROIDAB in the last 72 hours.  Invalid input(s): FREET3   Other results:   Imaging    No results found.   Medications:     Scheduled Medications:  albuterol  2.5 mg Nebulization Once   allopurinol  100 mg Oral Daily   ALPRAZolam  0.5 mg Oral  BID   amiodarone  200 mg Oral BID   apixaban  5 mg Oral BID   Chlorhexidine Gluconate Cloth  6 each Topical Daily   docusate sodium  100 mg Oral BID   empagliflozin  10 mg Oral Daily   insulin aspart  0-20 Units Subcutaneous TID WC   insulin aspart  0-5 Units Subcutaneous QHS   insulin aspart  6 Units Subcutaneous TID WC   insulin detemir  50 Units Subcutaneous BID   mouth rinse  15 mL Mouth Rinse BID   pantoprazole  40 mg Oral Daily   potassium chloride  20 mEq Oral Once   Ensure Max Protein  11 oz Oral BID   sacubitril-valsartan  1 tablet Oral BID   sertraline  25 mg Oral Daily   sodium chloride flush  10-40 mL Intracatheter Q12H   sodium chloride flush  3 mL Intravenous Q12H   sodium chloride flush  3 mL  Intravenous Q12H   spironolactone  25 mg Oral Daily   traZODone  100 mg Oral QHS    Infusions:  sodium chloride Stopped (04/30/21 2254)   sodium chloride      PRN Medications: sodium chloride, sodium chloride, acetaminophen, acetaminophen, nitroGLYCERIN, ondansetron (ZOFRAN) IV, ondansetron (ZOFRAN) IV, sodium chloride flush, sodium chloride flush, sodium chloride flush, sorbitol, traMADol  Assessment/Plan    1. 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  Echo in 9/21 with EF 20-25%.  Echo this admission with EF < 20%, severe LV dilation, moderate RV dysfunction.   - s/p S-ICD - 7/27: intubated with pulmonary edema and required addition of NE.  - Dobutamine weaned off on 8/3 - Co-ox stable today CVP 3. (Holding diuretics) - Bidil stopped 8/12 due to  SBP 80s-90s. - Continue Entresto 24/26 bid - Continue spiro 25 mg daily - Continue jardiance 10 mg daily - No BB for now d/t recent shock - Can add back torsemide as needed - Monitor BP. May need to consider midodrine. - VAD work-up started.    2 Atrial fibrillation: Paroxysmal - Suspect AF with RVR drove initial CHF exacerbation. Emergent TEE-guided DCCV 7/27 due to inability to control HR - Recurrent AF on 8/5. s/p DC-CV on 05/10/21. Remains in NSR  - Seen by EP. Not a candidate for ablation due to body habitus  (could consider AVN ablation and CRT, if needed)  - Amiodarone initially stopped due to concern for toxicity, but resumed after reversion to rapid afib on 8/5. IV amio off, now on po 200 mg BID - Continue Eliquis.  - Keep K> 4 Mg > 2 - Stable  3. Acute hypoxemic respiratory failure - Resp culture + for rare S. Aureus (MSSA)  - Treated with linezolid and cefepime. Cefepime switched to Ancef and Linezolid discontinued on 8/3. Ancef stopped 8/7 - Initial concern for amiodarone toxicity -- amio stopped and steroids started, but steroids stopped the next morning  on 8/4 -- per CCM team, more likely atelectasis driving hypoxia.  Deferred management to them. CT chest not c/w amio toxicity despite ESR 128 - Extubated on 8/4, initially to BiPAP and now on RA  4. AKI on CKD stage 3  - Has solitary kidney.   - Suspect cardiorenal syndrome/low output with AF/RVR in setting of cardiomyopathy.   - Baseline creatinine is 1.5-1.7, Creatinine peaked at 4.65. 1.48 today - Urinary obstruction likely played a role. Urology placed foley catheter with improvement in UOP. Foley removed  due to concern for infection after fever developed - Avoid hypotension. Cutting back bidil as above.  5. OSA - CPAP   6. Hypokalemia - K 3.8 today. Will supp   7. Acute gout - Received prednisone and colchicine -> resolved - Continue allopurinol  8. DM2. Poorly controlled - Jardiance - continue SSI - Hgb A1c 9.4 - DM consulted, appreciate input  9. Debility - HF much improved. Co-ox stable off inotropes. Remains in NSR. Main issue is severe debility.  - Remains quite weak and unable to ambulate independently. He appears to be a good candidate for Inpatient Rehab as he is quite motivated to improve and it will expedite our chances of getting him ready for VAD in a reasonable amount of time.  [place] denied [employer] - I called insurance company for peer-to-peer on Friday morning and they still haven't returned the call   Discharge planning:  - Awaiting CIR approval from insurance. - Medications to [facility] and Wellness  - F/u in HF clinic scheduled   Glori Bickers, MD  05/17/2021, 9:27 AM  Advanced Heart Failure Team Pager [phone] (M-F; 7a - 5p)  Please contact [facility] for night-coverage after hours (5p -7a ) and weekends on [email or web address]

## 2021-05-18 DIAGNOSIS — J9601 Acute respiratory failure with hypoxia: Secondary | ICD-10-CM | POA: Diagnosis not present

## 2021-05-18 DIAGNOSIS — N170 Acute kidney failure with tubular necrosis: Secondary | ICD-10-CM | POA: Diagnosis not present

## 2021-05-18 DIAGNOSIS — N183 Chronic kidney disease, stage 3 unspecified: Secondary | ICD-10-CM | POA: Diagnosis not present

## 2021-05-18 DIAGNOSIS — R570 Cardiogenic shock: Secondary | ICD-10-CM | POA: Diagnosis not present

## 2021-05-18 DIAGNOSIS — I4891 Unspecified atrial fibrillation: Secondary | ICD-10-CM | POA: Diagnosis not present

## 2021-05-18 DIAGNOSIS — N1831 Chronic kidney disease, stage 3a: Secondary | ICD-10-CM | POA: Diagnosis not present

## 2021-05-18 LAB — BASIC METABOLIC PANEL
Anion gap: 4 — ABNORMAL LOW (ref 5–15)
BUN: 22 mg/dL — ABNORMAL HIGH (ref 6–20)
CO2: 22 mmol/L (ref 22–32)
Calcium: 9 mg/dL (ref 8.9–10.3)
Chloride: 111 mmol/L (ref 98–111)
Creatinine, Ser: 1.42 mg/dL — ABNORMAL HIGH (ref 0.61–1.24)
GFR, Estimated: >60 mL/min (ref >60)
Glucose, Bld: 126 mg/dL — ABNORMAL HIGH (ref 70–99)
Potassium: 3.8 mmol/L (ref 3.5–5.1)
Sodium: 137 mmol/L (ref 135–145)

## 2021-05-18 LAB — CBC
HCT: 42.1 % (ref 39.0–52.0)
Hemoglobin: 13.3 g/dL (ref 13.0–17.0)
MCH: 26.9 pg (ref 26.0–34.0)
MCHC: 31.6 g/dL (ref 30.0–36.0)
MCV: 85.1 fL (ref 80.0–100.0)
Platelets: 477 10*3/uL — ABNORMAL HIGH (ref 150–400)
RBC: 4.95 MIL/uL (ref 4.22–5.81)
RDW: 17.2 % — ABNORMAL HIGH (ref 11.5–15.5)
WBC: 8 10*3/uL (ref 4.0–10.5)
nRBC: 0 % (ref 0.0–0.2)

## 2021-05-18 LAB — GLUCOSE, CAPILLARY
Glucose-Capillary: 121 mg/dL — ABNORMAL HIGH (ref 70–99)
Glucose-Capillary: 125 mg/dL — ABNORMAL HIGH (ref 70–99)
Glucose-Capillary: 111 mg/dL — ABNORMAL HIGH (ref 70–99)
Glucose-Capillary: 123 mg/dL — ABNORMAL HIGH (ref 70–99)

## 2021-05-18 LAB — COOXEMETRY PANEL
Carboxyhemoglobin: 0.9 % (ref 0.5–1.5)
Methemoglobin: 0.8 % (ref 0.0–1.5)
O2 Saturation: 64 %
Total hemoglobin: 13.7 g/dL (ref 12.0–16.0)

## 2021-05-18 MED ORDER — TORSEMIDE 20 MG PO TABS
40.0000 mg | ORAL_TABLET | Freq: Every day | ORAL | Status: DC
  Administered 2021-05-19: 40 mg via ORAL
  Filled 2021-05-18: qty 2

## 2021-05-18 MED ORDER — POTASSIUM CHLORIDE CRYS ER 20 MEQ PO TBCR
20.0000 meq | EXTENDED_RELEASE_TABLET | Freq: Once | ORAL | Status: AC
  Administered 2021-05-18: 20 meq via ORAL
  Filled 2021-05-18: qty 1

## 2021-05-18 NOTE — Progress Notes (Addendum)
Patient ID: Jeffrey [name], male   DOB: Dec 14, 1975, 45 y.o.   MRN: [id number]     Advanced Heart Failure Rounding Note  PCP-Cardiologist: Glori Bickers, MD   Subjective:    Off inotropes. Feels ok. Denies CP, SOB, orthopnea or PND.   Attempted to walk with CR rehab yesterday but required 2 person assist and could only go 3f with 1 rest break.   Co-ox 64% CVP 7-8  Objective:   Weight Range: (!) 142.9 kg Body mass index is 47.9 kg/m.   Vital Signs:   Temp:  [98.2 F (36.8 C)-98.9 F (37.2 C)] 98.9 F (37.2 C) (08/14 0631) Pulse Rate:  [87-92] 90 (08/14 0631) Resp:  [16-27] 20 (08/13 2113) BP: (96-101)/(64-85) 96/85 (08/14 0631) SpO2:  [87 %-97 %] 94 % (08/14 0631) Weight:  [142.9 kg] 142.9 kg (08/14 0500) Last BM Date: 05/17/21  Weight change: Filed Weights   05/16/21 0851 05/17/21 0500 05/18/21 0500  Weight: (!) 139.3 kg (!) 142 kg (!) 142.9 kg    Intake/Output:   Intake/Output Summary (Last 24 hours) at 05/18/2021 1042 Last data filed at 05/18/2021 0900 Gross per 24 hour  Intake 480 ml  Output 1525 ml  Net -1045 ml       Physical Exam   General:  Sitting up in bed No resp difficulty HEENT: normal Neck: supple. CVP 7-8 Carotids 2+ bilat; no bruits. No lymphadenopathy or thryomegaly appreciated. Cor: PMI nondisplaced. Regular rate & rhythm. No rubs, gallops or murmurs. Lungs: clear Abdomen: obese soft, nontender, nondistended. No hepatosplenomegaly. No bruits or masses. Good bowel sounds. Extremities: no cyanosis, clubbing, rash, edema Neuro: alert & orientedx3, cranial nerves grossly intact. moves all 4 extremities w/o difficulty. Affect pleasant   Telemetry   Sinus 80-90s Personally reviewed  Labs    CBC Recent Labs    05/17/21 0426 05/18/21 0430  WBC 7.4 8.0  HGB 13.0 13.3  HCT 40.8 42.1  MCV 85.7 85.1  PLT 438* 477*    Basic Metabolic Panel Recent Labs    05/17/21 0426 05/18/21 0430  NA 136 137  K 3.8 3.8  CL 107 111  CO2 22  22  GLUCOSE 138* 126*  BUN 28* 22*  CREATININE 1.48* 1.42*  CALCIUM 9.4 9.0    Liver Function Tests No results for input(s): AST, ALT, ALKPHOS, BILITOT, PROT, ALBUMIN in the last 72 hours.  No results for input(s): LIPASE, AMYLASE in the last 72 hours. Cardiac Enzymes No results for input(s): CKTOTAL, CKMB, CKMBINDEX, TROPONINI in the last 72 hours.  BNP: BNP (last 3 results) Recent Labs    11/21/20 2222 04/29/21 1247  BNP 65.0 1,407.7*     ProBNP (last 3 results) No results for input(s): PROBNP in the last 8760 hours.   D-Dimer No results for input(s): DDIMER in the last 72 hours. Hemoglobin A1C No results for input(s): HGBA1C in the last 72 hours.  Fasting Lipid Panel No results for input(s): CHOL, HDL, LDLCALC, TRIG, CHOLHDL, LDLDIRECT in the last 72 hours.   Thyroid Function Tests No results for input(s): TSH, T4TOTAL, T3FREE, THYROIDAB in the last 72 hours.  Invalid input(s): FREET3   Other results:   Imaging    No results found.   Medications:     Scheduled Medications:  albuterol  2.5 mg Nebulization Once   allopurinol  100 mg Oral Daily   ALPRAZolam  0.5 mg Oral BID   amiodarone  200 mg Oral BID   apixaban  5 mg Oral BID  Chlorhexidine Gluconate Cloth  6 each Topical Daily   docusate sodium  100 mg Oral BID   empagliflozin  10 mg Oral Daily   insulin aspart  0-20 Units Subcutaneous TID WC   insulin aspart  0-5 Units Subcutaneous QHS   insulin aspart  6 Units Subcutaneous TID WC   insulin detemir  50 Units Subcutaneous BID   mouth rinse  15 mL Mouth Rinse BID   pantoprazole  40 mg Oral Daily   Ensure Max Protein  11 oz Oral BID   sacubitril-valsartan  1 tablet Oral BID   sertraline  25 mg Oral Daily   sodium chloride flush  10-40 mL Intracatheter Q12H   sodium chloride flush  3 mL Intravenous Q12H   sodium chloride flush  3 mL Intravenous Q12H   spironolactone  25 mg Oral Daily   traZODone  100 mg Oral QHS    Infusions:  sodium  chloride Stopped (04/30/21 2254)   sodium chloride      PRN Medications: sodium chloride, sodium chloride, acetaminophen, acetaminophen, nitroGLYCERIN, ondansetron (ZOFRAN) IV, ondansetron (ZOFRAN) IV, sodium chloride flush, sodium chloride flush, sodium chloride flush, sorbitol, traMADol  Assessment/Plan    1. 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  Echo in 9/21 with EF 20-25%.  Echo this admission with EF < 20%, severe LV dilation, moderate RV dysfunction.   - s/p S-ICD - 7/27: intubated with pulmonary edema and required addition of NE.  - Dobutamine weaned off on 8/3 - Co-ox stable today - will stop checking - CVP 7-8 Weight climbing. Add back torsemide 40 daily - Bidil stopped 8/12 due to  SBP 80s-90s. - Continue Entresto 24/26 bid - Continue spiro 25 mg daily - Continue jardiance 10 mg daily - No BB for now d/t recent shock - VAD work-up started.   2 Atrial fibrillation: Paroxysmal - Suspect AF with RVR drove initial CHF exacerbation. Emergent TEE-guided DCCV 7/27 due to inability to control HR - Recurrent AF on 8/5. s/p DC-CV on 05/10/21. Remains in NSR  - Seen by EP. Not a candidate for ablation due to body habitus  (could consider AVN ablation and CRT, if needed)  - Amiodarone initially stopped due to concern for toxicity, but resumed after reversion to rapid afib on 8/5. IV amio off, now on po 200 mg BID - Continue Eliquis.  - Keep K> 4 Mg > 2. Supp K - Stable  3. Acute hypoxemic respiratory failure - Resp culture + for rare S. Aureus (MSSA)  - Treated with linezolid and cefepime. Cefepime switched to Ancef and Linezolid discontinued on 8/3. Ancef stopped 8/7 - Initial concern for amiodarone toxicity -- amio stopped and steroids started, but steroids stopped the next morning on 8/4 -- per CCM team, more likely atelectasis driving hypoxia.  Deferred management to them. CT chest not c/w amio toxicity despite ESR  128 - Extubated on 8/4, initially to BiPAP and now on RA - Stable  4. AKI on CKD stage 3  - Has solitary kidney.   - Suspect cardiorenal syndrome/low output with AF/RVR in setting of cardiomyopathy.   - Baseline creatinine is 1.5-1.7, Creatinine peaked at 4.65. 1.42 today - Urinary obstruction likely played a role. Urology placed foley catheter with improvement in UOP. No residual retentio - Avoid hypotension. Cutting back bidil as above.  5. OSA - CPAP   6. Hypokalemia - K 2.8 today. Will supp   7. Acute gout -  Received prednisone and colchicine -> resolved - Continue allopurinol  8. DM2. Poorly controlled - Jardiance - continue SSI - Hgb A1c 9.4 - DM consulted, appreciate input  9. Debility - HF much improved. Co-ox stable off inotropes. Remains in NSR. Main issue is severe debility.  - Remains quite weak and unable to ambulate independently. He appears to be a good candidate for Inpatient Rehab as he is quite motivated to improve and it will expedite our chances of getting him ready for VAD in a reasonable amount of time.  - Insurance company denied [employer] - I called insurance company for peer-to-peer on Friday morning and they still haven't returned the call. Hopefully we will get a response in the am.    Discharge planning:  - Awaiting CIR approval from insurance. - Continue aggressive PT/OT/CR - Medications to [facility] and Wellness  - F/u in HF clinic scheduled   Glori Bickers, MD  05/18/2021, 10:42 AM  Advanced Heart Failure Team Pager [phone] (M-F; 7a - 5p)  Please contact [facility] for night-coverage after hours (5p -7a ) and weekends on [email or web address]

## 2021-05-18 NOTE — Plan of Care (Signed)

## 2021-05-18 NOTE — Progress Notes (Signed)
Patient resting comfortably on home CPAP unit. No assistance needed from RT at this time. ?

## 2021-05-19 DIAGNOSIS — N1831 Chronic kidney disease, stage 3a: Secondary | ICD-10-CM | POA: Diagnosis not present

## 2021-05-19 DIAGNOSIS — R570 Cardiogenic shock: Secondary | ICD-10-CM | POA: Diagnosis not present

## 2021-05-19 DIAGNOSIS — N183 Chronic kidney disease, stage 3 unspecified: Secondary | ICD-10-CM | POA: Diagnosis not present

## 2021-05-19 DIAGNOSIS — I4891 Unspecified atrial fibrillation: Secondary | ICD-10-CM | POA: Diagnosis not present

## 2021-05-19 DIAGNOSIS — N170 Acute kidney failure with tubular necrosis: Secondary | ICD-10-CM | POA: Diagnosis not present

## 2021-05-19 DIAGNOSIS — J9601 Acute respiratory failure with hypoxia: Secondary | ICD-10-CM | POA: Diagnosis not present

## 2021-05-19 LAB — GLUCOSE, CAPILLARY
Glucose-Capillary: 119 mg/dL — ABNORMAL HIGH (ref 70–99)
Glucose-Capillary: 160 mg/dL — ABNORMAL HIGH (ref 70–99)
Glucose-Capillary: 167 mg/dL — ABNORMAL HIGH (ref 70–99)
Glucose-Capillary: 180 mg/dL — ABNORMAL HIGH (ref 70–99)

## 2021-05-19 LAB — CBC
HCT: 41.7 % (ref 39.0–52.0)
Hemoglobin: 13 g/dL (ref 13.0–17.0)
MCH: 26.6 pg (ref 26.0–34.0)
MCHC: 31.2 g/dL (ref 30.0–36.0)
MCV: 85.5 fL (ref 80.0–100.0)
Platelets: 448 10*3/uL — ABNORMAL HIGH (ref 150–400)
RBC: 4.88 MIL/uL (ref 4.22–5.81)
RDW: 17.1 % — ABNORMAL HIGH (ref 11.5–15.5)
WBC: 6.6 10*3/uL (ref 4.0–10.5)
nRBC: 0 % (ref 0.0–0.2)

## 2021-05-19 LAB — BASIC METABOLIC PANEL
Anion gap: 7 (ref 5–15)
BUN: 18 mg/dL (ref 6–20)
CO2: 21 mmol/L — ABNORMAL LOW (ref 22–32)
Calcium: 9.5 mg/dL (ref 8.9–10.3)
Chloride: 108 mmol/L (ref 98–111)
Creatinine, Ser: 1.6 mg/dL — ABNORMAL HIGH (ref 0.61–1.24)
GFR, Estimated: 53 mL/min — ABNORMAL LOW (ref >60)
Glucose, Bld: 132 mg/dL — ABNORMAL HIGH (ref 70–99)
Potassium: 4.5 mmol/L (ref 3.5–5.1)
Sodium: 136 mmol/L (ref 135–145)

## 2021-05-19 MED ORDER — TORSEMIDE 20 MG PO TABS: 40.0000 mg | ORAL_TABLET | ORAL | Status: DC

## 2021-05-19 NOTE — Progress Notes (Addendum)
Patient ID: Jeffrey [name], male   DOB: Mar 04, 1976, 45 y.o.   MRN: [id number]     Advanced Heart Failure Rounding Note  PCP-Cardiologist: Glori Bickers, MD   Subjective:    Off inotropes. No dyspnea or chest pain. Diarrhea improved after stopping colchicine.  Ambulating short distances in hall with assistance.  Charted weight up from Friday, weighed in bed over the weekend.  CVP 3-4  Creatinine 1.48 > 1.42 > 1.60  Objective:   Weight Range: (!) 142.2 kg Body mass index is 47.67 kg/m.   Vital Signs:   Temp:  [98.2 F (36.8 C)-98.7 F (37.1 C)] 98.2 F (36.8 C) (08/15 0412) Pulse Rate:  [85-96] 90 (08/15 0723) Resp:  [16-20] 20 (08/15 0723) BP: (91-107)/(69-85) 98/81 (08/15 0412) SpO2:  [91 %-94 %] 91 % (08/15 0723) Weight:  [142.2 kg] 142.2 kg (08/15 0412) Last BM Date: 05/18/21  Weight change: Filed Weights   05/17/21 0500 05/18/21 0500 05/19/21 0412  Weight: (!) 142 kg (!) 142.9 kg (!) 142.2 kg    Intake/Output:   Intake/Output Summary (Last 24 hours) at 05/19/2021 0751 Last data filed at 05/19/2021 0724 Gross per 24 hour  Intake 740 ml  Output 1450 ml  Net -710 ml      Physical Exam   General:  Lying comfortably in bed. No resp difficulty HEENT: normal Neck: supple. CVP 3-4. Carotids 2+ bilat; no bruits. No lymphadenopathy or thryomegaly appreciated. Cor: PMI nondisplaced. Regular rate & rhythm. No rubs, gallops or murmurs. Lungs: clear Abdomen: obese soft, nontender, nondistended. No hepatosplenomegaly. No bruits or masses. Good bowel sounds. Extremities: no cyanosis, clubbing, rash, edema Neuro: alert & orientedx3, cranial nerves grossly intact. moves all 4 extremities w/o difficulty. Affect pleasant   Telemetry   NSR 80s-90s. Personally reviewed  Labs    CBC Recent Labs    05/18/21 0430 05/19/21 0440  WBC 8.0 6.6  HGB 13.3 13.0  HCT 42.1 41.7  MCV 85.1 85.5  PLT 477* 024*   Basic Metabolic Panel Recent Labs    05/18/21 0430  05/19/21 0440  NA 137 136  K 3.8 4.5  CL 111 108  CO2 22 21*  GLUCOSE 126* 132*  BUN 22* 18  CREATININE 1.42* 1.60*  CALCIUM 9.0 9.5   Liver Function Tests No results for input(s): AST, ALT, ALKPHOS, BILITOT, PROT, ALBUMIN in the last 72 hours.  No results for input(s): LIPASE, AMYLASE in the last 72 hours. Cardiac Enzymes No results for input(s): CKTOTAL, CKMB, CKMBINDEX, TROPONINI in the last 72 hours.  BNP: BNP (last 3 results) Recent Labs    11/21/20 2222 04/29/21 1247  BNP 65.0 1,407.7*    ProBNP (last 3 results) No results for input(s): PROBNP in the last 8760 hours.   D-Dimer No results for input(s): DDIMER in the last 72 hours. Hemoglobin A1C No results for input(s): HGBA1C in the last 72 hours.  Fasting Lipid Panel No results for input(s): CHOL, HDL, LDLCALC, TRIG, CHOLHDL, LDLDIRECT in the last 72 hours.   Thyroid Function Tests No results for input(s): TSH, T4TOTAL, T3FREE, THYROIDAB in the last 72 hours.  Invalid input(s): FREET3   Other results:   Imaging    No results found.   Medications:     Scheduled Medications:  albuterol  2.5 mg Nebulization Once   allopurinol  100 mg Oral Daily   ALPRAZolam  0.5 mg Oral BID   amiodarone  200 mg Oral BID   apixaban  5 mg Oral BID  Chlorhexidine Gluconate Cloth  6 each Topical Daily   docusate sodium  100 mg Oral BID   empagliflozin  10 mg Oral Daily   insulin aspart  0-20 Units Subcutaneous TID WC   insulin aspart  0-5 Units Subcutaneous QHS   insulin aspart  6 Units Subcutaneous TID WC   insulin detemir  50 Units Subcutaneous BID   mouth rinse  15 mL Mouth Rinse BID   pantoprazole  40 mg Oral Daily   Ensure Max Protein  11 oz Oral BID   sacubitril-valsartan  1 tablet Oral BID   sertraline  25 mg Oral Daily   sodium chloride flush  10-40 mL Intracatheter Q12H   sodium chloride flush  3 mL Intravenous Q12H   sodium chloride flush  3 mL Intravenous Q12H   spironolactone  25 mg Oral  Daily   torsemide  40 mg Oral Daily   traZODone  100 mg Oral QHS    Infusions:  sodium chloride Stopped (04/30/21 2254)   sodium chloride      PRN Medications: sodium chloride, sodium chloride, acetaminophen, acetaminophen, nitroGLYCERIN, ondansetron (ZOFRAN) IV, ondansetron (ZOFRAN) IV, sodium chloride flush, sodium chloride flush, sodium chloride flush, sorbitol, traMADol  Assessment/Plan    1. 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  Echo in 9/21 with EF 20-25%.  Echo this admission with EF < 20%, severe LV dilation, moderate RV dysfunction.   - s/p S-ICD - 7/27: intubated with pulmonary edema and required addition of NE.  - Dobutamine weaned off on 8/3 - Co-ox D/C'd - CVP 3-4.  Torsemide added back at 40 mg daily on 08/14 d/t charted weight gain. Over weekend only bed weights obtained. Will update standing weight. SBP remains in 90s/low 100s. If standing weight stable, may need to hold diuretic. - Bidil stopped 8/12 due to SBP 80s-90s. - Continue Entresto 24/26 bid - Continue spiro 25 mg daily - Continue jardiance 10 mg daily - No BB for now d/t recent shock - VAD work-up started.   2 Atrial fibrillation: Paroxysmal - Suspect AF with RVR drove initial CHF exacerbation. Emergent TEE-guided DCCV 7/27 due to inability to control HR - Recurrent AF on 8/5. s/p DC-CV on 05/10/21. Remains in NSR  - Seen by EP. Not a candidate for ablation due to body habitus  (could consider AVN ablation and CRT, if needed)  - Amiodarone initially stopped due to concern for toxicity, but resumed after reversion to rapid afib on 8/5. IV amio off, now on po 200 mg BID - Continue Eliquis.  - Keep K> 4 Mg > 2. Supp K  3. Acute hypoxemic respiratory failure - Resp culture + for rare S. Aureus (MSSA)  - Treated with linezolid and cefepime. Cefepime switched to Ancef and Linezolid discontinued on 8/3. Ancef stopped 8/7 - Initial concern for  amiodarone toxicity -- amio stopped and steroids started, but steroids stopped the next morning on 8/4 -- per CCM team, more likely atelectasis driving hypoxia.  Deferred management to them. CT chest not c/w amio toxicity despite ESR 128 - Extubated on 8/4, initially to BiPAP and now on RA - Stable  4. AKI on CKD stage 3  - Has solitary kidney.   - Suspect cardiorenal syndrome/low output with AF/RVR in setting of cardiomyopathy.   - Baseline creatinine is 1.5-1.7, Creatinine peaked at 4.65. 1.60 today - Urinary obstruction likely played a role. Urology placed foley catheter with improvement in UOP. No  residual retention. - Avoid hypotension. Off bidil.  5. OSA - CPAP   6. Hypokalemia - K 4.5 today.  - Continue close monitoring.  7. Acute gout - Received prednisone and colchicine -> resolved - Continue allopurinol  8. DM2. Poorly controlled - Jardiance - continue SSI - Hgb A1c 9.4 - DM consulted, appreciate input  9. Debility - HF much improved. Co-ox stable off inotropes. Remains in NSR. Main issue is severe debility.  - Remains quite weak and unable to ambulate independently. He appears to be a good candidate for Inpatient Rehab as he is quite motivated to improve and it will expedite our chances of getting him ready for VAD in a reasonable amount of time.  [facility] denied CIR - Dr. [name] called insurance company for peer-to-peer on Friday morning and they still haven't returned the call. Hopefully we will get a response this am.   Discharge planning: - Awaiting CIR approval from insurance. - Continue aggressive PT/OT/CR - Medications to [facility] and Wellness  - F/u in HF clinic scheduled   [facility], Lynder Parents, PA-C  05/19/2021, 7:51 AM  Advanced Heart Failure Team Pager [phone] (M-F; 7a - 5p)  Please contact [facility] for night-coverage after hours (5p -7a ) and weekends on [email or web address]  Patient seen and examined with the above-signed Advanced  Practice Provider and/or Housestaff. I personally reviewed laboratory data, imaging studies and relevant notes. I independently examined the patient and formulated the important aspects of the plan. I have edited the note to reflect any of my changes or salient points. I have personally discussed the plan with the patient and/or family.  Feels ok. No CP or SOB. Started back on torsemide yesterday. Weight down 2 pounds but have been getting bed weights as he has a hard time standing. CVP 3-4. SBP soft  SCr stable  General:  Lying in bed No resp difficulty HEENT: normal Neck: supple. no JVD. Carotids 2+ bilat; no bruits. No lymphadenopathy or thryomegaly appreciated. Cor: PMI nondisplaced. Regular rate & rhythm. No rubs, gallops or murmurs. Lungs: clear Abdomen: obese soft, nontender, nondistended. No hepatosplenomegaly. No bruits or masses. Good bowel sounds. Extremities: no cyanosis, clubbing, rash, edema Neuro: alert & orientedx3, cranial nerves grossly intact. moves all 4 extremities w/o difficulty. Affect pleasant  CVP low today. Would switch torsemide to every other day. If SBP remains < 100. Will switch Entresto to losartan.   W/u for VAD ongoing.   Main issue remains severe debility. Will reach out again to insurance to see if we can get him to CIR to try to get him ready for eventual VAD placement.   Glori Bickers, MD  9:13 AM

## 2021-05-19 NOTE — Plan of Care (Signed)

## 2021-05-19 NOTE — Progress Notes (Addendum)
Inpatient Rehab Admissions Coordinator:   Following for my colleague, Karene Fry.  Note that [employer] has not returned call for peer to peer.  I left a voicemail asking for an urgent call back to expedite this.  Will continue to follow.    13:30 Received a voicemail (I answered on the first ring and the call still jumped to voicemail), stating that peer to peers were scheduled and the insurance MD had a full schedule on Friday and Monday.  Representative stated she had put our request on the schedule for noon tomorrow.  I called back and left a voicemail (there is no option to speak to someone directly) that we would accept that time.   Shann Medal, PT, DPT Admissions Coordinator [phone] 05/19/21  11:22 AM

## 2021-05-19 NOTE — Progress Notes (Signed)
CARDIAC REHAB PHASE I   PRE:  Rate/Rhythm: 108 ST  BP:  Supine:   Sitting: 114/93  Standing:    SaO2: 96%RA  MODE:  Ambulation: 70,70 ft   POST:  Rate/Rhythm: 123 ST  BP:  Supine:   Sitting: 107/88  Standing:    SaO2: 92%RA 1240-1320 Pt ready to walk. Walked 70 ft on RA with rolling walker, sat and walked another 30f after rest. Pt seemed stronger today. Very motivated. Back to bed after walk. Will try shoes next walk as pt stated he has heel spurs.   CGraylon Good RN BSN  05/19/2021 1:14 PM

## 2021-05-19 NOTE — Progress Notes (Signed)
RT note. Asked patient if he needed help being placed on home cpap machine tonight, patient stated he can place himself on cpap. Home unit all set up for the night, RT will continue to monitor.

## 2021-05-20 DIAGNOSIS — N170 Acute kidney failure with tubular necrosis: Secondary | ICD-10-CM | POA: Diagnosis not present

## 2021-05-20 DIAGNOSIS — J9601 Acute respiratory failure with hypoxia: Secondary | ICD-10-CM | POA: Diagnosis not present

## 2021-05-20 DIAGNOSIS — N1831 Chronic kidney disease, stage 3a: Secondary | ICD-10-CM | POA: Diagnosis not present

## 2021-05-20 DIAGNOSIS — R570 Cardiogenic shock: Secondary | ICD-10-CM | POA: Diagnosis not present

## 2021-05-20 DIAGNOSIS — I4891 Unspecified atrial fibrillation: Secondary | ICD-10-CM | POA: Diagnosis not present

## 2021-05-20 DIAGNOSIS — N183 Chronic kidney disease, stage 3 unspecified: Secondary | ICD-10-CM | POA: Diagnosis not present

## 2021-05-20 LAB — GLUCOSE, CAPILLARY
Glucose-Capillary: 139 mg/dL — ABNORMAL HIGH (ref 70–99)
Glucose-Capillary: 125 mg/dL — ABNORMAL HIGH (ref 70–99)
Glucose-Capillary: 179 mg/dL — ABNORMAL HIGH (ref 70–99)
Glucose-Capillary: 141 mg/dL — ABNORMAL HIGH (ref 70–99)

## 2021-05-20 LAB — CBC
HCT: 44 % (ref 39.0–52.0)
Hemoglobin: 14.5 g/dL (ref 13.0–17.0)
MCH: 27.7 pg (ref 26.0–34.0)
MCHC: 33 g/dL (ref 30.0–36.0)
MCV: 84.1 fL (ref 80.0–100.0)
Platelets: 465 10*3/uL — ABNORMAL HIGH (ref 150–400)
RBC: 5.23 MIL/uL (ref 4.22–5.81)
RDW: 17.1 % — ABNORMAL HIGH (ref 11.5–15.5)
WBC: 6 10*3/uL (ref 4.0–10.5)
nRBC: 0.3 % — ABNORMAL HIGH (ref 0.0–0.2)

## 2021-05-20 LAB — BASIC METABOLIC PANEL
Anion gap: 9 (ref 5–15)
BUN: 25 mg/dL — ABNORMAL HIGH (ref 6–20)
CO2: 23 mmol/L (ref 22–32)
Calcium: 9.2 mg/dL (ref 8.9–10.3)
Chloride: 105 mmol/L (ref 98–111)
Creatinine, Ser: 1.77 mg/dL — ABNORMAL HIGH (ref 0.61–1.24)
GFR, Estimated: 47 mL/min — ABNORMAL LOW (ref >60)
Glucose, Bld: 145 mg/dL — ABNORMAL HIGH (ref 70–99)
Potassium: 3.6 mmol/L (ref 3.5–5.1)
Sodium: 137 mmol/L (ref 135–145)

## 2021-05-20 MED ORDER — LOSARTAN POTASSIUM 25 MG PO TABS
25.0000 mg | ORAL_TABLET | Freq: Every day | ORAL | Status: DC
  Administered 2021-05-20 – 2021-05-21 (×2): 25 mg via ORAL
  Filled 2021-05-20 (×2): qty 1

## 2021-05-20 MED ORDER — POTASSIUM CHLORIDE CRYS ER 20 MEQ PO TBCR
40.0000 meq | EXTENDED_RELEASE_TABLET | Freq: Once | ORAL | Status: AC
  Administered 2021-05-20: 40 meq via ORAL
  Filled 2021-05-20: qty 2

## 2021-05-20 MED ORDER — TORSEMIDE 20 MG PO TABS
40.0000 mg | ORAL_TABLET | ORAL | Status: DC
  Administered 2021-05-21: 40 mg via ORAL
  Filled 2021-05-20: qty 2

## 2021-05-20 NOTE — Progress Notes (Signed)
1450 Offered to walk with pt. Will walk with PT later. Graylon Good RN BSN 05/20/2021 2:56 PM

## 2021-05-20 NOTE — TOC Progression Note (Addendum)
Transition of Care (TOC) - Progression Note  Heart Failure   Patient Details  Name: Jeffrey [name] MRN: [id number] Date of Birth: 01-13-1976  Transition of Care [facility]) CM/SW [place], [place] Phone Number: 05/20/2021, 10:10 AM  Clinical Narrative:    CSW reached out to [place], [place] to follow up about the insurance authorization for the patients Medicaid and they reported that it still isn't back and will let the CSW know when it is and they do still have a bed available. CSW will follow up with [facility] this afternoon to see about the insurance authorization.   3:11pm - CSW reached out to [place] again and spoke with Shirlee Limerick [phone] to see if anything further came back for [name] and the insurance authorization. Shirlee Limerick reported that she had to send over more clinicals and will call the CSW once hearing back about the insurance authorization.  3:15pm - CSW received a call from [place] with CIR Admissions to see if anything further about the peer to peer and the scheduled 12pm phone call with Dr. [name] occurred today. It is unclear if the the call took place and the CIR coordinator to see what can be done to move the process along.  CSW will continue to follow throughout discharge.   Expected Discharge Plan: [place] Barriers to Discharge: Continued Medical Work up  Expected Discharge Plan and Services Expected Discharge Plan: [place] In-house Referral: Clinical Social Work Discharge Planning Services: CM Consult Post Acute Care Choice: [place] arrangements for the past 2 months: Single Family Home                 DME Arranged: 3-N-1, Walker rolling with seat, Shower stool DME Agency: [employer]                   Social Determinants of Health (SDOH) Interventions Food Insecurity Interventions: Assist with [employer] Application Financial Strain Interventions: Other (Comment)  (Referral to [facility] for Disability and Food Stamps) Housing Interventions: Intervention Not Indicated Transportation Interventions: [employer]  Readmission Risk Interventions No flowsheet data found.  Orris Perin, MSW, [place] Heart Failure Social Worker

## 2021-05-20 NOTE — Progress Notes (Signed)
Inpatient Rehab Admissions Coordinator:   Appears peer to peer was not completed today as scheduled (per [employer] yesterday, was supposed to be at noon today).  I called and spoke to pt's case manager, [place], who says it now appears to be scheduled for tomorrow between 9-930.  I let Dr. [name] know.  Tiombe to reach out to insurance MD to confirm time, as we have been trying to schedule this peer to peer multiple times a day for five days and the lack of response has led to a avoidable increase in patient's length of stay.    Shann Medal, PT, DPT Admissions Coordinator [phone] 05/20/21  4:27 PM

## 2021-05-20 NOTE — Progress Notes (Signed)
Patient ID: Jeffrey [name], male   DOB: 30-Dec-1975, 45 y.o.   MRN: [id number]     Advanced Heart Failure Rounding Note  PCP-Cardiologist: Glori Bickers, MD   Subjective:    Feeling better. Denies orthopnea or PND. Says he diuresed well with torsemide.   Continues to work with PT/OT and CR but still only able to walk 50-75 feet at a time with walker. SBP in low 90s   Objective:   Weight Range: (!) 138.7 kg Body mass index is 46.49 kg/m.   Vital Signs:   Temp:  [98.1 F (36.7 C)-98.3 F (36.8 C)] 98.1 F (36.7 C) (08/16 0413) Pulse Rate:  [77-95] 90 (08/16 0413) Resp:  [19-20] 20 (08/16 0413) BP: (95-118)/(63-84) 95/73 (08/16 0413) SpO2:  [90 %-94 %] 93 % (08/16 0413) Weight:  [138.7 kg] 138.7 kg (08/16 0413) Last BM Date: 05/18/21  Weight change: Filed Weights   05/18/21 0500 05/19/21 0412 05/20/21 0413  Weight: (!) 142.9 kg (!) 142.2 kg (!) 138.7 kg    Intake/Output:   Intake/Output Summary (Last 24 hours) at 05/20/2021 0634 Last data filed at 05/19/2021 2300 Gross per 24 hour  Intake --  Output 3950 ml  Net -3950 ml       Physical Exam   General:  Lying in bed No resp difficulty HEENT: normal Neck: supple. no JVD. Carotids 2+ bilat; no bruits. No lymphadenopathy or thryomegaly appreciated. Cor: PMI nondisplaced. Regular rate & rhythm. No rubs, gallops or murmurs. Lungs: clear Abdomen: obese soft, nontender, nondistended. No hepatosplenomegaly. No bruits or masses. Good bowel sounds. Extremities: no cyanosis, clubbing, rash, edema Neuro: alert & orientedx3, cranial nerves grossly intact. moves all 4 extremities w/o difficulty. Affect pleasant   Telemetry   NSR 80-90s. Personally reviewed  Labs    CBC Recent Labs    05/19/21 0440 05/20/21 0515  WBC 6.6 6.0  HGB 13.0 14.5  HCT 41.7 44.0  MCV 85.5 84.1  PLT 448* 465*    Basic Metabolic Panel Recent Labs    05/19/21 0440 05/20/21 0515  NA 136 137  K 4.5 3.6  CL 108 105  CO2 21* 23   GLUCOSE 132* 145*  BUN 18 25*  CREATININE 1.60* 1.77*  CALCIUM 9.5 9.2    Liver Function Tests No results for input(s): AST, ALT, ALKPHOS, BILITOT, PROT, ALBUMIN in the last 72 hours.  No results for input(s): LIPASE, AMYLASE in the last 72 hours. Cardiac Enzymes No results for input(s): CKTOTAL, CKMB, CKMBINDEX, TROPONINI in the last 72 hours.  BNP: BNP (last 3 results) Recent Labs    11/21/20 2222 04/29/21 1247  BNP 65.0 1,407.7*     ProBNP (last 3 results) No results for input(s): PROBNP in the last 8760 hours.   D-Dimer No results for input(s): DDIMER in the last 72 hours. Hemoglobin A1C No results for input(s): HGBA1C in the last 72 hours.  Fasting Lipid Panel No results for input(s): CHOL, HDL, LDLCALC, TRIG, CHOLHDL, LDLDIRECT in the last 72 hours.   Thyroid Function Tests No results for input(s): TSH, T4TOTAL, T3FREE, THYROIDAB in the last 72 hours.  Invalid input(s): FREET3   Other results:   Imaging    No results found.   Medications:     Scheduled Medications:  albuterol  2.5 mg Nebulization Once   allopurinol  100 mg Oral Daily   ALPRAZolam  0.5 mg Oral BID   amiodarone  200 mg Oral BID   apixaban  5 mg Oral BID   Chlorhexidine  Gluconate Cloth  6 each Topical Daily   docusate sodium  100 mg Oral BID   empagliflozin  10 mg Oral Daily   insulin aspart  0-20 Units Subcutaneous TID WC   insulin aspart  0-5 Units Subcutaneous QHS   insulin aspart  6 Units Subcutaneous TID WC   insulin detemir  50 Units Subcutaneous BID   mouth rinse  15 mL Mouth Rinse BID   pantoprazole  40 mg Oral Daily   Ensure Max Protein  11 oz Oral BID   sacubitril-valsartan  1 tablet Oral BID   sertraline  25 mg Oral Daily   sodium chloride flush  10-40 mL Intracatheter Q12H   sodium chloride flush  3 mL Intravenous Q12H   sodium chloride flush  3 mL Intravenous Q12H   spironolactone  25 mg Oral Daily   [START ON 05/21/2021] torsemide  40 mg Oral QODAY    traZODone  100 mg Oral QHS    Infusions:  sodium chloride Stopped (04/30/21 2254)   sodium chloride      PRN Medications: sodium chloride, sodium chloride, acetaminophen, acetaminophen, nitroGLYCERIN, ondansetron (ZOFRAN) IV, ondansetron (ZOFRAN) IV, sodium chloride flush, sodium chloride flush, sodium chloride flush, sorbitol, traMADol  Assessment/Plan    1. 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  Echo in 9/21 with EF 20-25%.  Echo this admission with EF < 20%, severe LV dilation, moderate RV dysfunction.   - s/p S-ICD - 7/27: intubated with pulmonary edema and required addition of NE.  - Dobutamine weaned off on 8/3 - Co-ox D/C'd - CVP < 5. Decrease torsemide to q MWF - Bidil stopped 8/12 due to SBP 80s-90s. - Given soft BPs will switch Entresto to losartan 25 qhs  - Continue spiro 25 mg daily - Continue jardiance 10 mg daily - No BB for now d/t recent shock - VAD work-up started.   2 Atrial fibrillation: Paroxysmal - Suspect AF with RVR drove initial CHF exacerbation. Emergent TEE-guided DCCV 7/27 due to inability to control HR - Recurrent AF on 8/5. s/p DC-CV on 05/10/21. Remains in nSR  - Seen by EP. Not a candidate for ablation due to body habitus  (could consider AVN ablation and CRT, if needed)  - Amiodarone initially stopped due to concern for toxicity, but resumed after reversion to rapid afib on 8/5. IV amio off, now on po 200 mg BID - Continue Eliquis.  - Keep K> 4 Mg > 2. Supp K  3. Acute hypoxemic respiratory failure - Resp culture + for rare S. Aureus (MSSA)  - Treated with linezolid and cefepime. Cefepime switched to Ancef and Linezolid discontinued on 8/3. Ancef stopped 8/7 - Initial concern for amiodarone toxicity -- amio stopped and steroids started, but steroids stopped the next morning on 8/4 -- per CCM team, more likely atelectasis driving hypoxia.  Deferred management to them. CT chest not c/w  amio toxicity despite ESR 128 - Extubated on 8/4, initially to BiPAP and now on RA - Stable   4. AKI on CKD stage 3  - Has solitary kidney.   - Suspect cardiorenal syndrome/low output with AF/RVR in setting of cardiomyopathy.   - Baseline creatinine is 1.5-1.7, Creatinine peaked at 4.65. 1.60 -> 1.77 today. Torsemide decreased - Urinary obstruction likely played a role. Urology placed foley catheter with improvement in UOP. No residual retention. - Avoid hypotension. Off bidil.  5. OSA - CPAP   6. Hypokalemia - K  3.6 today. Supp  - Continue close monitoring.  7. Acute gout - Received prednisone and colchicine -> resolved - Continue allopurinol  8. DM2. Poorly controlled - Jardiance - continue SSI - Hgb A1c 9.4 - DM consulted, appreciate input  9. Debility - HF much improved. Co-ox stable off inotropes. Remains in NSR. Main issue is severe debility.  - Remains quite weak and unable to ambulate independently. He appears to be a good candidate for Inpatient Rehab as he is quite motivated to improve and it will expedite our chances of getting him ready for VAD in a reasonable amount of time.  [place] denied [employer] - I have called insurance company for peer-to-peer on Friday morning and they still haven't returned the call. Hopefully we will get a response today. Issue has been escalated to hospital administration for assistance.   Discharge planning: - Awaiting CIR approval from insurance. - Continue aggressive PT/OT/CR - Medications to [facility]  - F/u in HF clinic scheduled   Glori Bickers, MD  05/20/2021, 6:34 AM  Advanced Heart Failure Team Pager [phone] (M-F; 7a - 5p)  Please contact [facility] for night-coverage after hours (5p -7a ) and weekends on [email or web address]

## 2021-05-20 NOTE — Progress Notes (Signed)
Physical Therapy Treatment Patient Details Name: Jeffrey [name] MRN: [id number] DOB: 12/15/1975 Today's Date: 05/20/2021    History of Present Illness 45yo admitted with nausea and weight gain 7/26. Pt with Afib with RVR leading to CHF exacerbation, cardiogenic shock and respiratory failure. 7/27 cardioversion with intubation, extubated 8/4. 8/6 cardioversion. Hospital course complicated by gout in B feet and R elbow. 8/9 cardiac cath. PMhx: poorly controlled HTN, CKD stage 3a, PAF, DM2, morbid obesity, chronic systolic CHF, NICM s/p S-ICD, right renal cell CA s/p nephrectomy.    PT Comments    Pt received seated EOB (got back to bed from toilet with nsg stand-by), pt requesting to continue gait progression (previous session ended early due to need for toileting) and with good participation and tolerance for short household distance gait trials with RW and min guard for safety. Gait distance limited due to B knee pain/fatigue and reports 5-6/10 modified RPE (fatigue) at end of session. Pt continues to benefit from PT services to progress toward functional mobility goals.   Follow Up Recommendations  CIR;SNF (SNF if unable to get CIR placement)     Equipment Recommendations  Rolling walker with 5" wheels;3in1 (PT);Other (comment) (bariatric)    Recommendations for Other Services       Precautions / Restrictions Precautions Precautions: Fall Precaution Comments: B knee pain Restrictions Weight Bearing Restrictions: No    Mobility  Bed Mobility Overal bed mobility: Needs Assistance Bed Mobility: Supine to Sit     Supine to sit: Supervision;HOB elevated     General bed mobility comments: pt sitting EOB    Transfers Overall transfer level: Needs assistance Equipment used: Rolling walker (2 wheeled) Transfers: Sit to/from Stand Sit to Stand: Supervision         General transfer comment: From elevated bed height to RW with Supervision without UE  support  Ambulation/Gait Ambulation/Gait assistance: Min guard Gait Distance (Feet): 60 Feet (5f, 659fwith seated break between (forward/backward in room, limited distance due to lines/pt quick to fatigue)) Assistive device: Rolling walker (2 wheeled) Gait Pattern/deviations: Step-through pattern;Decreased stride length;Wide base of support Gait velocity: Reduced   General Gait Details: pt in room, short/low steps with stiff-knee gait pattern due to B knee pain but improved knee flexion as distance progressed, no buckling this date but remains quick to fatigue.   Stairs             Wheelchair Mobility    Modified Rankin (Stroke Patients Only)       Balance Overall balance assessment: Needs assistance Sitting-balance support: No upper extremity supported;Feet unsupported Sitting balance-Leahy Scale: Good Sitting balance - Comments: able to sit without UE support   Standing balance support: Bilateral upper extremity supported Standing balance-Leahy Scale: Poor Standing balance comment: Relies on BUE support of RW            Cognition Arousal/Alertness: Awake/alert Behavior During Therapy: WFL for tasks assessed/performed Overall Cognitive Status: Within Functional Limits for tasks assessed                                 General Comments: pleasant, cooperative      Exercises General Exercises - Lower Extremity Long Arc Quad: AROM;Both;Seated;15 reps Hip Flexion/Marching: AROM;Both;Seated;10 reps Other Exercises Other Exercises: reciprocal STS x 5 reps    General Comments General comments (skin integrity, edema, etc.): VSS on RA      Pertinent Vitals/Pain Pain Assessment: Faces Faces Pain Scale:  Hurts even more Pain Location: B knee pain with gait trials Pain Descriptors / Indicators: Discomfort;Aching Pain Intervention(s): Monitored during session;Limited activity within patient's tolerance     PT Goals (current goals can now be  found in the care plan section) Acute Rehab PT Goals Patient Stated Goal: return to independence PT Goal Formulation: With patient Time For Goal Achievement: 05/23/21 Progress towards PT goals: Progressing toward goals    Frequency    Min 3X/week      PT Plan Current plan remains appropriate       AM-PAC PT "6 Clicks" Mobility   Outcome Measure  Help needed turning from your back to your side while in a flat bed without using bedrails?: A Little Help needed moving from lying on your back to sitting on the side of a flat bed without using bedrails?: A Little Help needed moving to and from a bed to a chair (including a wheelchair)?: A Little Help needed standing up from a chair using your arms (e.g., wheelchair or bedside chair)?: A Little Help needed to walk in hospital room?: A Little Help needed climbing 3-5 steps with a railing? : Total 6 Click Score: 16    End of Session Equipment Utilized During Treatment: Gait belt Activity Tolerance: Patient tolerated treatment well Patient left: in chair;with call [place]/phone within reach;with chair alarm set Nurse Communication: Mobility status;Other (comment) (pt could benefit from larger room, pt needs new stickers for tele leads they keep falling off his chest) PT Visit Diagnosis: Other abnormalities of gait and mobility (R26.89);Difficulty in walking, not elsewhere classified (R26.2);Muscle weakness (generalized) (M62.81)     Time: [id number] PT Time Calculation (min) (ACUTE ONLY): 11 min  Charges:  $Gait Training: 8-22 mins                     Ceasia Elwell P., PTA Acute Rehabilitation Services Pager: [phone] Office: [place] 05/20/2021, 4:11 PM

## 2021-05-20 NOTE — Progress Notes (Signed)
RT note. Patient able to place self on home unit cpap for the night, Home unit all set up for use. RT will continue to monitor.

## 2021-05-20 NOTE — Progress Notes (Signed)
Physical Therapy Treatment Patient Details Name: Jeffrey [name] MRN: [id number] DOB: Mar 09, 1976 Today's Date: 05/20/2021    History of Present Illness 45yo admitted with nausea and weight gain 7/26. Pt with Afib with RVR leading to CHF exacerbation, cardiogenic shock and respiratory failure. 7/27 cardioversion with intubation, extubated 8/4. 8/6 cardioversion. Hospital course complicated by gout in B feet and R elbow. 8/9 cardiac cath. PMhx: poorly controlled HTN, CKD stage 3a, PAF, DM2, morbid obesity, chronic systolic CHF, NICM s/p S-ICD, right renal cell CA s/p nephrectomy.    PT Comments    Pt received in supine, agreeable to therapy session and with good participation and tolerance for transfer and seated/standing exercises for BLE strengthening. Pt Supervision level for transfers from elevated bed height and min guard to sit at lower toilet height. Pt performed seated exercises with verbal cues and good carryover of instruction. Plan to bring HEP handout next session to reinforce exercises. Pt continues to benefit from PT services to progress toward functional mobility goals. Goals updated by supervising PT Ryan L after discussion on pt progress.  Follow Up Recommendations  CIR;SNF (SNF if unable to get CIR placement)     Equipment Recommendations  Rolling walker with 5" wheels;3in1 (PT);Other (comment) (bariatric)    Recommendations for Other Services       Precautions / Restrictions Precautions Precautions: Fall Precaution Comments: B knee pain Restrictions Weight Bearing Restrictions: No    Mobility  Bed Mobility Overal bed mobility: Needs Assistance Bed Mobility: Supine to Sit     Supine to sit: Supervision;HOB elevated     General bed mobility comments: assist with multiple lines/bed sheets during transition, pt using bed rails to assist    Transfers Overall transfer level: Needs assistance Equipment used: Rolling walker (2 wheeled) Transfers: Sit to/from  Stand Sit to Stand: Supervision         General transfer comment: Performed reciprocal sit to stand x5 with supervision from EOB for safety. Supervision to sit on lower toilet surface as well using wall rail  Ambulation/Gait Ambulation/Gait assistance: Min guard Gait Distance (Feet): 10 Feet Assistive device: Rolling walker (2 wheeled) Gait Pattern/deviations: Step-through pattern;Decreased stride length;Wide base of support Gait velocity: Reduced   General Gait Details: EOB to bathroom, short/low steps with stiff-knee gait pattern due to B knee pain   Stairs             Wheelchair Mobility    Modified Rankin (Stroke Patients Only)       Balance Overall balance assessment: Needs assistance Sitting-balance support: No upper extremity supported;Feet unsupported Sitting balance-Leahy Scale: Good Sitting balance - Comments: able to sit without UE support   Standing balance support: Bilateral upper extremity supported Standing balance-Leahy Scale: Poor Standing balance comment: Relies of BUE support of RW                            Cognition Arousal/Alertness: Awake/alert Behavior During Therapy: WFL for tasks assessed/performed Overall Cognitive Status: Within Functional Limits for tasks assessed                                 General Comments: pleasant, cooperative      Exercises General Exercises - Lower Extremity Long Arc Quad: AROM;Both;Seated;15 reps Hip Flexion/Marching: AROM;Both;Seated;10 reps Other Exercises Other Exercises: reciprocal STS x 5 reps    General Comments General comments (skin integrity, edema, etc.): BP WNL  seated EOB and SpO2 WNL on RA; HR 80's -117 bpm with exertion      Pertinent Vitals/Pain Pain Assessment: Faces Faces Pain Scale: Hurts little more Pain Location: B knee pain and initially with headache Pain Descriptors / Indicators: Headache;Discomfort Pain Intervention(s): Limited activity within  patient's tolerance;Monitored during session;Repositioned (pt defers pain med premedication)     PT Goals (current goals can now be found in the care plan section) Acute Rehab PT Goals Patient Stated Goal: return to independence PT Goal Formulation: With patient Time For Goal Achievement: 05/23/21 Progress towards PT goals: Progressing toward goals    Frequency    Min 3X/week      PT Plan Discharge plan needs to be updated       AM-PAC PT "6 Clicks" Mobility   Outcome Measure  Help needed turning from your back to your side while in a flat bed without using bedrails?: A Little Help needed moving from lying on your back to sitting on the side of a flat bed without using bedrails?: A Little Help needed moving to and from a bed to a chair (including a wheelchair)?: A Little Help needed standing up from a chair using your arms (e.g., wheelchair or bedside chair)?: A Little Help needed to walk in hospital room?: A Little Help needed climbing 3-5 steps with a railing? : Total 6 Click Score: 16    End of Session Equipment Utilized During Treatment: Gait belt Activity Tolerance: Patient tolerated treatment well Patient left: in chair;with call [place]/phone within reach;with chair alarm set Nurse Communication: Mobility status PT Visit Diagnosis: Other abnormalities of gait and mobility (R26.89);Difficulty in walking, not elsewhere classified (R26.2);Muscle weakness (generalized) (M62.81)     Time: [id number] PT Time Calculation (min) (ACUTE ONLY): 29 min  Charges:  $Therapeutic Exercise: 8-22 mins $Therapeutic Activity: 8-22 mins                     Nanea Jared P., PTA Acute Rehabilitation Services Pager: [phone] Office: [place] 05/20/2021, 4:01 PM

## 2021-05-20 NOTE — Progress Notes (Signed)
Nutrition Follow-up  DOCUMENTATION CODES:   Morbid obesity  INTERVENTION:   - Double protein portions TID with meals  - d/c Ensure Max  NUTRITION DIAGNOSIS:   Inadequate oral intake related to acute illness as evidenced by NPO status.  Progressing, pt now on Carb Modified diet with 100% meal completions  GOAL:   Patient will meet greater than or equal to 90% of their needs  Progressing  MONITOR:   PO intake, Supplement acceptance, Labs, Weight trends, I & O's  REASON FOR ASSESSMENT:   Consult LVAD Eval  ASSESSMENT:   45 yo male admitted with decompensated heart failure in setting of afib, worsening respiratory failure requiring intubation. PMH includes CHF, CKD III, solitary kidney secondary to R renal cell carcinoma  7/26 - admitted 7/27 - cardioversion, worsening respiratory status requiring intubation 8/03 - Cortrak placed 8/04 - extubated 8/05 - transitioned to nocturnal TF 8/06 - cardioversion 8/07 - Cortrak removed 8/09 - s/p RHC  Spoke with pt at bedside. Pt reports excellent appetite and PO intake. Noted ~75% completed lunch meal tray at bedside. Pt reports that he does not like the Ensure Max shakes. RD to d/c these and will order double protein portions TID with meals. Pt hopeful to d/c to CIR.  Admit weight: 152.9 kg Current weight: 138.7 kg  Meal Completion: 100% x last 8 documented meals  Medications reviewed and include: colace, jardiance, SSI, novolog 6 units TID with meals, levemir 50 units BID, protonix, Ensure Max BID, spironolactone, torsemide  Labs reviewed: BUN 25, creatinine 1.77 CBG's: 139-180 x 24 hours  UOP: 3950 ml x 24 hours I/O's: -12.7 L since admit  NUTRITION - FOCUSED PHYSICAL EXAM:  Flowsheet Row Most Recent Value  Orbital Region No depletion  Upper Arm Region No depletion  Thoracic and Lumbar Region No depletion  Buccal Region No depletion  Temple Region No depletion  Clavicle Bone Region No depletion  Clavicle and  Acromion Bone Region No depletion  Scapular Bone Region No depletion  Dorsal Hand No depletion  Patellar Region No depletion  Anterior Thigh Region No depletion  Posterior Calf Region No depletion  Edema (RD Assessment) Mild  [BLE]  Hair Reviewed  Eyes Reviewed  Mouth Reviewed  Skin Reviewed  Nails Reviewed       Diet Order:   Diet Order             Diet Carb Modified Fluid consistency: Thin; Room service appropriate? Yes  Diet effective now                   EDUCATION NEEDS:   Not appropriate for education at this time  Skin:  Skin Assessment: Reviewed RN Assessment  Last BM:  05/18/21  Height:   Ht Readings from Last 1 Encounters:  05/07/21 '5\' 8"'$  (1.727 m)    Weight:   Wt Readings from Last 1 Encounters:  05/20/21 (!) 138.7 kg    BMI:  Body mass index is 46.49 kg/m.  Estimated Nutritional Needs:   Kcal:  2200-2400  Protein:  130-150 grams  Fluid:  >/= 2 L/day    Gustavus Bryant, MS, RD, LDN Inpatient Clinical Dietitian Please see AMiON for contact information.

## 2021-05-20 NOTE — TOC CM/SW Note (Signed)
HF TOC CM contacted [employer] [phone] ext [phone] to set up appt time with their Medical Director to complete Peer to Peer. Left message with necessary information for [facility] to call provider, Dr [name] left pager and contact number to return call. [place], Heart Failure TOC CM [phone]

## 2021-05-21 ENCOUNTER — Telehealth: Payer: Self-pay | Admitting: Licensed Clinical Social Worker

## 2021-05-21 DIAGNOSIS — N183 Chronic kidney disease, stage 3 unspecified: Secondary | ICD-10-CM | POA: Diagnosis not present

## 2021-05-21 DIAGNOSIS — R570 Cardiogenic shock: Secondary | ICD-10-CM | POA: Diagnosis not present

## 2021-05-21 DIAGNOSIS — N1831 Chronic kidney disease, stage 3a: Secondary | ICD-10-CM | POA: Diagnosis not present

## 2021-05-21 DIAGNOSIS — I4891 Unspecified atrial fibrillation: Secondary | ICD-10-CM | POA: Diagnosis not present

## 2021-05-21 DIAGNOSIS — J9601 Acute respiratory failure with hypoxia: Secondary | ICD-10-CM | POA: Diagnosis not present

## 2021-05-21 DIAGNOSIS — N170 Acute kidney failure with tubular necrosis: Secondary | ICD-10-CM | POA: Diagnosis not present

## 2021-05-21 LAB — BASIC METABOLIC PANEL
Anion gap: 7 (ref 5–15)
BUN: 26 mg/dL — ABNORMAL HIGH (ref 6–20)
CO2: 21 mmol/L — ABNORMAL LOW (ref 22–32)
Calcium: 9.6 mg/dL (ref 8.9–10.3)
Chloride: 106 mmol/L (ref 98–111)
Creatinine, Ser: 1.58 mg/dL — ABNORMAL HIGH (ref 0.61–1.24)
GFR, Estimated: 54 mL/min — ABNORMAL LOW (ref >60)
Glucose, Bld: 160 mg/dL — ABNORMAL HIGH (ref 70–99)
Potassium: 4.1 mmol/L (ref 3.5–5.1)
Sodium: 134 mmol/L — ABNORMAL LOW (ref 135–145)

## 2021-05-21 LAB — GLUCOSE, CAPILLARY
Glucose-Capillary: 150 mg/dL — ABNORMAL HIGH (ref 70–99)
Glucose-Capillary: 184 mg/dL — ABNORMAL HIGH (ref 70–99)
Glucose-Capillary: 139 mg/dL — ABNORMAL HIGH (ref 70–99)
Glucose-Capillary: 155 mg/dL — ABNORMAL HIGH (ref 70–99)

## 2021-05-21 NOTE — Progress Notes (Signed)
Patient ID: Jeffrey [name], male   DOB: 01-Aug-1976, 45 y.o.   MRN: [id number]     Advanced Heart Failure Rounding Note  PCP-Cardiologist: Glori Bickers, MD   Subjective:    Denies CP or SOB. CVP line not working.   Remains in NSR. Systolic BP now >031 after Entresto switched to losartan  Worked with PT yesterday but only able to ambulate room. Gets fatigued easily and needs frequent rest breaks.   Objective:   Weight Range: (!) 140.2 kg Body mass index is 47 kg/m.   Vital Signs:   Temp:  [98.3 F (36.8 C)-98.7 F (37.1 C)] 98.7 F (37.1 C) (08/17 0258) Pulse Rate:  [89-96] 89 (08/17 0258) Resp:  [18-20] 19 (08/17 0258) BP: (91-113)/(70-88) 113/88 (08/17 0258) SpO2:  [91 %-96 %] 91 % (08/17 0258) Weight:  [140.2 kg] 140.2 kg (08/17 0258) Last BM Date: 05/18/21  Weight change: Filed Weights   05/20/21 0413 05/20/21 0500 05/21/21 0258  Weight: (!) 138.7 kg (!) 138.7 kg (!) 140.2 kg    Intake/Output:   Intake/Output Summary (Last 24 hours) at 05/21/2021 0705 Last data filed at 05/21/2021 0643 Gross per 24 hour  Intake 360 ml  Output 1200 ml  Net -840 ml       Physical Exam   General:  Well appearing. No resp difficulty HEENT: normal Neck: supple. no JVD. Carotids 2+ bilat; no bruits. No lymphadenopathy or thryomegaly appreciated. Cor: PMI nondisplaced. Regular rate & rhythm. No rubs, gallops or murmurs. Lungs: clear Abdomen: obese soft, nontender, nondistended. No hepatosplenomegaly. No bruits or masses. Good bowel sounds. Extremities: no cyanosis, clubbing, rash, edema Neuro: alert & orientedx3, cranial nerves grossly intact. moves all 4 extremities w/o difficulty. Affect pleasant  Telemetry   NSR 80-90s Personally reviewed  Labs    CBC Recent Labs    05/19/21 0440 05/20/21 0515  WBC 6.6 6.0  HGB 13.0 14.5  HCT 41.7 44.0  MCV 85.5 84.1  PLT 448* 465*    Basic Metabolic Panel Recent Labs    05/19/21 0440 05/20/21 0515  NA 136 137  K 4.5  3.6  CL 108 105  CO2 21* 23  GLUCOSE 132* 145*  BUN 18 25*  CREATININE 1.60* 1.77*  CALCIUM 9.5 9.2    Liver Function Tests No results for input(s): AST, ALT, ALKPHOS, BILITOT, PROT, ALBUMIN in the last 72 hours.  No results for input(s): LIPASE, AMYLASE in the last 72 hours. Cardiac Enzymes No results for input(s): CKTOTAL, CKMB, CKMBINDEX, TROPONINI in the last 72 hours.  BNP: BNP (last 3 results) Recent Labs    11/21/20 2222 04/29/21 1247  BNP 65.0 1,407.7*     ProBNP (last 3 results) No results for input(s): PROBNP in the last 8760 hours.   D-Dimer No results for input(s): DDIMER in the last 72 hours. Hemoglobin A1C No results for input(s): HGBA1C in the last 72 hours.  Fasting Lipid Panel No results for input(s): CHOL, HDL, LDLCALC, TRIG, CHOLHDL, LDLDIRECT in the last 72 hours.   Thyroid Function Tests No results for input(s): TSH, T4TOTAL, T3FREE, THYROIDAB in the last 72 hours.  Invalid input(s): FREET3   Other results:   Imaging    No results found.   Medications:     Scheduled Medications:  albuterol  2.5 mg Nebulization Once   allopurinol  100 mg Oral Daily   ALPRAZolam  0.5 mg Oral BID   amiodarone  200 mg Oral BID   apixaban  5 mg Oral BID  Chlorhexidine Gluconate Cloth  6 each Topical Daily   docusate sodium  100 mg Oral BID   empagliflozin  10 mg Oral Daily   insulin aspart  0-20 Units Subcutaneous TID WC   insulin aspart  0-5 Units Subcutaneous QHS   insulin aspart  6 Units Subcutaneous TID WC   insulin detemir  50 Units Subcutaneous BID   losartan  25 mg Oral QHS   mouth rinse  15 mL Mouth Rinse BID   pantoprazole  40 mg Oral Daily   sertraline  25 mg Oral Daily   sodium chloride flush  10-40 mL Intracatheter Q12H   sodium chloride flush  3 mL Intravenous Q12H   spironolactone  25 mg Oral Daily   torsemide  40 mg Oral Q M,W,F   traZODone  100 mg Oral QHS    Infusions:  sodium chloride Stopped (04/30/21 2254)    PRN  Medications: sodium chloride, acetaminophen, nitroGLYCERIN, ondansetron (ZOFRAN) IV, sodium chloride flush, sodium chloride flush, sorbitol, traMADol  Assessment/Plan    1. 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  Echo in 9/21 with EF 20-25%.  Echo this admission with EF < 20%, severe LV dilation, moderate RV dysfunction.   - s/p S-ICD - 7/27: intubated with pulmonary edema and required addition of NE.  - Dobutamine weaned off on 8/3 - Bidil stopped 8/12 due to SBP 80s-90s. - Continue losartan 25 qhs  (switched from Entresto to losartan on 8/16 due to low BPs) - Continue spiro 25 mg daily - Continue jardiance 10 mg daily - No BB for now d/t recent shock - VAD work-up started.  - Need labs this am   2 Atrial fibrillation: Paroxysmal - Suspect AF with RVR drove initial CHF exacerbation. Emergent TEE-guided DCCV 7/27 due to inability to control HR - Recurrent AF on 8/5. s/p DC-CV on 05/10/21. Remains in nSR  - Seen by EP. Not a candidate for ablation due to body habitus  (could consider AVN ablation and CRT, if needed)  - Amiodarone initially stopped due to concern for toxicity, but resumed after reversion to rapid afib on 8/5. IV amio off, now on po 200 mg BID - Continue Eliquis.  - Keep K> 4 Mg > 2. Supp K - labs pending this am   3. Acute hypoxemic respiratory failure - Resp culture + for rare S. Aureus (MSSA)  - Treated with linezolid and cefepime. Cefepime switched to Ancef and Linezolid discontinued on 8/3. Ancef stopped 8/7 - Initial concern for amiodarone toxicity -- amio stopped and steroids started, but steroids stopped the next morning on 8/4 -- per CCM team, more likely atelectasis driving hypoxia.  Deferred management to them. CT chest not c/w amio toxicity despite ESR 128 - Extubated on 8/4, initially to BiPAP and now on RA - Stable   4. AKI on CKD stage 3  - Has solitary kidney.   - Suspect cardiorenal  syndrome/low output with AF/RVR in setting of cardiomyopathy.   - Baseline creatinine is 1.5-1.7, Creatinine peaked at 4.65. 1.60 -> 1.77 yesterday. Torsemide decreased to MWF - Urinary obstruction likely played a role. Urology placed foley catheter with improvement in UOP. No residual retention. - Avoid hypotension. Off bidil. - BMET ordered for this am   5. OSA - CPAP   6. Hypokalemia - Labs pending  - Continue close monitoring.  7. Acute gout - Received prednisone and colchicine -> resolved - Continue allopurinol  8.  DM2. Poorly controlled - Jardiance - continue SSI - Hgb A1c 9.4 - DM consulted, appreciate input  9. Debility - HF much improved. Co-ox stable off inotropes. Remains in NSR. Main issue is severe debility.  - Remains quite weak and unable to ambulate independently. He appears to be a good candidate for Inpatient Rehab as he is quite motivated to improve and it will expedite our chances of getting him ready for VAD in a reasonable amount of time.  [place] denied [employer] - I have called insurance company for peer-to-peer on Friday morning and they still haven't returned the call. We had a call scheduled for Noon yesterday (8/16) but they have not called. Issue has been escalated to hospital administration for assistance.   Discharge planning: - Awaiting CIR approval from insurance. - Continue aggressive PT/OT/CR - Medications to [facility]  - F/u in HF clinic scheduled   Glori Bickers, MD  05/21/2021, 7:05 AM  Advanced Heart Failure Team Pager [phone] (M-F; 7a - 5p)  Please contact [facility] for night-coverage after hours (5p -7a ) and weekends on [email or web address]

## 2021-05-21 NOTE — Plan of Care (Signed)
  Problem: Education: Goal: Knowledge of General Education information will improve Description: Including pain rating scale, medication(s)/side effects and non-pharmacologic comfort measures Outcome: Progressing   Problem: Health Behavior/Discharge Planning: Goal: Ability to manage health-related needs will improve Outcome: Progressing   Problem: Clinical Measurements: Goal: Ability to maintain clinical measurements within normal limits will improve Outcome: Progressing Goal: Cardiovascular complication will be avoided Outcome: Progressing   Problem: Activity: Goal: Risk for activity intolerance will decrease Outcome: Progressing   Problem: Coping: Goal: Level of anxiety will decrease Outcome: Progressing

## 2021-05-21 NOTE — Telephone Encounter (Signed)
LCSW received an inquiry from inpatient CSW [place], related to childcare assistance for pt as he will be going to CIR and his wife is currently employed so it may be difficult managing childcare payments. LCSW provided the following information to pass on to pt/pt wife:  "[place] & Referral Line to speak with a Parent Counselor who can give you referrals to licensed child care facilities along with other resource information. Call [phone] or [phone] or email [email or web address]."  Westley Hummer, MSW, [place]  [phone]

## 2021-05-21 NOTE — Progress Notes (Signed)
Dr Vaughan Browner received approval for CIR after completing peer to peer today.   MCW [id number]   Ha Placeres NP-C  9:09 AM

## 2021-05-21 NOTE — H&P (Addendum)
Physical Medicine and Rehabilitation Admission H&P    Chief Complaint  Patient presents with   Shortness of Breath   Chest Pain   Dizziness  : HPI: Jeffrey [name] is a 45 year old right-handed male with history of anxiety, chronic diastolic congestive heart failure, childhood asthma chronic systolic CHF CKD stage II, morbid obesity with BMI 47, hyperlipidemia, hypertension, PAF, nonischemic cardiomyopathy status post ICD, renal cell carcinoma status post nephrectomy, type 2 diabetes mellitus.  Per chart review lives with spouse.  Independent but sedentary prior to admission.  1 level home 5 steps to entry.  Presented 04/29/2021 with intermittent nausea as well as shortness of breath and lower extremity edema.  In the ER he was found to be in A. fib with RVR in the 160s with frequent runs of NSVT.  He denied any chest pain.  Sodium 137 potassium 3.6 creatinine 1.99 from baseline 1.49 in April 2022, troponin 75-64, WBC 9.6.  Chest x-ray showed mild CHF.  Cardiology services consulted placed on IV diuresis.  Echocardiogram with ejection fraction of less than 17% grade 3 diastolic dysfunction.  He did undergo emergent TEE guided DCCV 7/27 due to inability to control heart rate.  Not a candidate for ablation due to body habitus.  He had been placed on amiodarone stopped due to concern for toxicity and adjusted accordingly.  He did remain on Eliquis as directed.  Intermittent bouts of orthostasis adjustments made per cardiology services.  Hospital course complicated by acute hypoxemic respiratory failure as noted respiratory cultures positive for rare S-aureus treated with linezolid and cefepime.  AKI on CKD creatinine peaked 4.65 felt to be possibly also related to diuresis and his torsemide was adjusted.  He did have some urinary obstruction likely playing a role in AKI urology consulted placed a Foley catheter tube that is since been removed.  Patient is currently awaiting plan for LVAD as stabilizes from a  cardiac standpoint.  Patient did have acute gout flareup responding well to prednisone and colchicine which is since resolved and continues on allopurinol.  Therapy evaluations completed and patient was admitted for a comprehensive rehab program due to decreased functional mobility. Complains of weakness and fatigue.  . Review of Systems  Constitutional:  Positive for malaise/fatigue. Negative for chills and fever.  HENT:  Negative for hearing loss.   Eyes:  Negative for blurred vision and double vision.  Respiratory:  Positive for shortness of breath. Negative for cough.   Cardiovascular:  Positive for palpitations and leg swelling. Negative for chest pain.  Gastrointestinal:  Positive for constipation and nausea. Negative for heartburn and vomiting.  Genitourinary:  Positive for urgency. Negative for dysuria, flank pain and hematuria.  Musculoskeletal:  Positive for joint pain and myalgias.  Skin:  Negative for rash.  Neurological:  Positive for weakness.  Psychiatric/Behavioral:         Anxiety  All other systems reviewed and are negative. Past Medical History:  Diagnosis Date   Anxiety    Aspirin allergy    Childhood asthma    Chronic systolic CHF (congestive heart failure) ([place])    a. EF 20-25% in 2012. b. EF 45-50% in 10/2011 with nonischemic nuc - presumed NICM. c. 12/2014 Echo: Sev depressed LV fxn, sev dil LV, mild LVH, mild MR, sev dil LA, mildly reduced RV fxn.   CKD (chronic kidney disease) stage 2, GFR 60-89 ml/min    H/O vasectomy 12/2019   High cholesterol    Hypertension    Morbid obesity (  [place])    Nephrolithiasis    OSA on CPAP    Paroxysmal atrial fibrillation ([place])    Presumed NICM    a. 04/2014 Myoview: EF 26%, glob HK, sev glob HK, ? prior infarct;  b. Never cathed 2/2 CKD.   Renal cell carcinoma ([place])    a. s/p Rt robotic assisted partial converted to radical nephrectomy on 01/2013.   Troponin level elevated    a. 04/2014, 12/2014: felt due to CHF.   Type II  diabetes mellitus ([place])    Ventricular tachycardia ([place])    a. appropriate ICD therapy 12/2017   Past Surgical History:  Procedure Laterality Date   APPENDECTOMY  07/2004   BIOPSY  02/10/2021   Procedure: BIOPSY;  Surgeon: Gatha Mayer, MD;  Location: [facility];  Service: Endoscopy;;   CARDIAC CATHETERIZATION N/A 05/17/2015   Procedure: Right/Left Heart Cath and Coronary Angiography;  Surgeon: Jolaine Artist, MD;  Location: [place] [facility];  Service: Cardiovascular;  Laterality: N/A;   CARDIOVERSION N/A 04/30/2021   Procedure: CARDIOVERSION;  Surgeon: Larey Dresser, MD;  Location[facility] ENDOSCOPY;  Service: Cardiovascular;  Laterality: N/A;   COLONOSCOPY WITH PROPOFOL N/A 02/10/2021   Procedure: COLONOSCOPY WITH PROPOFOL;  Surgeon: Gatha Mayer, MD;  Location: [facility];  Service: Endoscopy;  Laterality: N/A;   EP IMPLANTABLE DEVICE N/A 05/17/2015   Procedure: SubQ ICD Implant;  Surgeon: Will Meredith Leeds, MD;  Location: [place] [facility];  Service: Cardiovascular;  Laterality: N/A;   ESOPHAGOGASTRODUODENOSCOPY (EGD) WITH PROPOFOL N/A 02/10/2021   Procedure: ESOPHAGOGASTRODUODENOSCOPY (EGD) WITH PROPOFOL;  Surgeon: Gatha Mayer, MD;  Location: [facility];  Service: Endoscopy;  Laterality: N/A;   RIGHT HEART CATH N/A 05/13/2021   Procedure: RIGHT HEART CATH;  Surgeon: Jolaine Artist, MD;  Location: [place] [facility];  Service: Cardiovascular;  Laterality: N/A;   ROBOTIC ASSISTED LAPAROSCOPIC LYSIS OF ADHESION  01/13/2013   Procedure: ROBOTIC ASSISTED LAPAROSCOPIC LYSIS OF ADHESION EXTENSIVE;  Surgeon: Alexis Frock, MD;  Location: [facility];  Service: Urology;;   ROBOTIC ASSITED PARTIAL NEPHRECTOMY Right 01/13/2013   Procedure: ROBOTIC ASSITED PARTIAL NEPHRECTOMY CONVERTED TO ROBOTIC ASSISTED RIGHT RADICAL NEPHRECTOMY;  Surgeon: Alexis Frock, MD;  Location: [facility];  Service: Urology;  Laterality: Right;   SUBQ ICD CHANGEOUT N/A 05/16/2020   Procedure: SUBQ ICD CHANGEOUT;   Surgeon: [name], MD;  Location: [place] [facility];  Service: Cardiovascular;  Laterality: N/A;   SUBQ ICD CHANGEOUT N/A 05/15/2020   Procedure: SUBQ ICD CHANGEOUT;  Surgeon: [name], MD;  Location: [place] [facility];  Service: Cardiovascular;  Laterality: N/A;   TEE WITHOUT CARDIOVERSION N/A 04/30/2021   Procedure: TRANSESOPHAGEAL ECHOCARDIOGRAM (TEE);  Surgeon: Larey Dresser, MD;  Location: [facility] ENDOSCOPY;  Service: Cardiovascular;  Laterality: N/A;   VASECTOMY     Family History  Problem Relation Age of Onset   Hypertension Mother    Diabetes Maternal Aunt    Heart attack Neg Hx    Stroke Neg Hx    Colon cancer Neg Hx    Pancreatic cancer Neg Hx    Stomach cancer Neg Hx    Liver cancer Neg Hx    Esophageal cancer Neg Hx    Social History:  reports that he has been smoking cigarettes. He has a 5.50 pack-year smoking history. He has never used smokeless tobacco. He reports current alcohol use. He reports current drug use. Frequency: 14.00 times per week. Drug: Marijuana. Allergies:  Allergies  Allergen Reactions   Aspirin Shortness Of Breath, Itching and Rash     Burning sensation (Patient reports he tolerates other NSAIDS)    Bee Venom Hives and Swelling   Lisinopril Cough   Tomato Rash   Medications Prior to Admission  Medication Sig Dispense Refill   allopurinol (ZYLOPRIM) 100 MG tablet Take 1 tablet (100 mg total) by mouth daily. 90 tablet 3   amiodarone (PACERONE) 100 MG tablet TAKE 1 TABLET (100 MG TOTAL) BY MOUTH DAILY. (Patient taking differently: Take 100 mg by mouth daily.) 30 tablet 11   apixaban (ELIQUIS) 5 MG TABS tablet TAKE 1 TABLET (5 MG TOTAL) BY MOUTH 2 (TWO) TIMES DAILY. RESUME WITH EVENING DOSE ON SUNDAY (Patient taking differently: Take 5 mg by mouth 2 (two) times daily.) 60 tablet 10   busPIRone (BUSPAR) 10 MG tablet TAKE 1 TABLET (10 MG TOTAL) BY MOUTH 2 (TWO) TIMES DAILY. (Patient taking differently: Take 10 mg by mouth 2 (two) times  daily.) 60 tablet 11   carvedilol (COREG) 6.25 MG tablet TAKE 1 TABLET (6.25 MG TOTAL) BY MOUTH 2 (TWO) TIMES DAILY. (Patient taking differently: Take 6.25 mg by mouth 2 (two) times daily.) 30 tablet 11   cetirizine (ZYRTEC) 10 MG tablet Take 1 tablet (10 mg total) by mouth daily. 90 tablet 3   empagliflozin (JARDIANCE) 10 MG TABS tablet TAKE 1 TABLET (10 MG TOTAL) BY MOUTH DAILY BEFORE BREAKFAST. (Patient taking differently: Take 10 mg by mouth daily before breakfast.) 90 tablet 3   famotidine (PEPCID) 40 MG tablet TAKE 1 TABLET (40 MG TOTAL) BY MOUTH DAILY AS NEEDED FOR HEARTBURN OR INDIGESTION. (Patient taking differently: Take 40 mg by mouth daily as needed for indigestion or heartburn.) 30 tablet 2   ferrous sulfate 325 (65 FE) MG tablet Take 1 tablet (325 mg total) by mouth daily with breakfast. 90 tablet 0   fluticasone (FLONASE) 50 MCG/ACT nasal spray Place 2 sprays into both nostrils daily. (Patient taking differently: Place 2 sprays into both nostrils daily as needed for allergies.) 16 g 6   gabapentin (NEURONTIN) 300 MG capsule Take 1 capsule (300 mg total) by mouth 3 (three) times daily. 270 capsule 3   glipiZIDE (GLUCOTROL) 10 MG tablet Take 1 tablet (10 mg total) by mouth 2 (two) times daily before a meal. 180 tablet 3   insulin aspart (NOVOLOG) 100 UNIT/ML FlexPen INJECT 0-15 UNIT(S) INTO THE SKIN THREE TIMES DAILY WITH MEALS VIA SLIDING SCALE (Patient taking differently: Inject 0-15 Units into the skin 3 (three) times daily with meals.) 15 mL 12   insulin NPH-regular Human (70-30) 100 UNIT/ML injection INJECT 38 UNITS INTO THE SKIN 2 (TWO) TIMES DAILY WITH A MEAL. (Patient taking differently: Inject 38 Units into the skin 2 (two) times daily with a meal.) 60 mL 12   metFORMIN (GLUCOPHAGE) 1000 MG tablet Take 1 tablet (1,000 mg total) by mouth 2 (two) times daily with a meal. 60 tablet 3   oxyCODONE (ROXICODONE) 5 MG immediate release tablet Take 1 tablet (5 mg total) by mouth every 6  (six) hours as needed for up to 10 doses for breakthrough pain. 10 tablet 0   sacubitril-valsartan (ENTRESTO) 49-51 MG TAKE 1 TABLET BY MOUTH 2 (TWO) TIMES DAILY. 60 tablet 6   spironolactone (ALDACTONE) 25 MG tablet Take 1 tablet (25 mg total) by mouth daily. 30 tablet 6   torsemide (DEMADEX) 20 MG tablet TAKE 1 TABLET (20 MG TOTAL) BY MOUTH DAILY. (Patient taking differently: Take 20  mg by mouth daily.) 60 tablet 6   traZODone (DESYREL) 100 MG tablet Take 1 tablet (100 mg total) by mouth at bedtime. For sleep 90 tablet 3   Vitamin D, Ergocalciferol, (DRISDOL) 1.25 MG (50000 UNIT) CAPS capsule TAKE 1 CAPSULE (50,000 UNITS TOTAL) BY MOUTH ONCE A WEEK. (Patient taking differently: Take 50,000 Units by mouth every Monday.) 5 capsule 2   Accu-Chek FastClix Lancets MISC USE AS DIRECTED FOUR TIMES DAILY 102 each 11   blood glucose meter kit and supplies Use up to four times daily as directed. 1 each 0   Continuous Blood Gluc Receiver (FREESTYLE LIBRE 14 DAY READER) DEVI 1 each by Does not apply route 3 (three) times daily between meals as needed. 1 each 1   Continuous Blood Gluc Sensor (GUARDIAN SENSOR 3) MISC 1 Device by Does not apply route 3 (three) times daily between meals as needed. 5 each 6   Insulin Pen Needle 31G X 5 MM MISC USE AS DIRECTED 100 each 12   Insulin Syringe-Needle U-100 31G X 5/16" 0.5 ML MISC USE TO INJECT INSULIN TWICE DAILY. 100 each 11   Magnesium Oxide 200 MG TABS Take 100 mg by mouth daily.     predniSONE (DELTASONE) 10 MG tablet take 2 tablets by mouth for 2 days, then 1 tab for 2 days, then stop (Patient not taking: Reported on 04/29/2021) 6 tablet 0    Drug Regimen Review Drug regimen was reviewed and remains appropriate with no significant issues identified  Home: Home Living Family/patient expects to be discharged to:: Private residence Living Arrangements: Spouse/significant other, Children Available Help at Discharge: Family, Available 24 hours/day Type of Home:  House Home Access: Stairs to enter, Ramped entrance [employer] of Steps: 5; ramp in back Entrance Stairs-Rails: Right Home Layout: One level Bathroom Shower/Tub: Multimedia programmer: Standard Home Equipment: None   Functional History: Prior Function Level of Independence: Independent Comments: stands to shower, wears slip on shoes, struggles with socks and washing feet  Functional Status:  Mobility: Bed Mobility Overal bed mobility: Needs Assistance Bed Mobility: Supine to Sit Rolling: Min assist Sidelying to sit: Min assist, +2 for physical assistance Supine to sit: Modified independent (Device/Increase time) Sit to supine: Supervision General bed mobility comments: increased time/effort Transfers Overall transfer level: Needs assistance Equipment used: 4-wheeled walker Transfer via Lift Equipment: Stedy Transfers: Sit to/from Stand Sit to Stand: Supervision, Min guard General transfer comment: from low bed to rollator with Supervision but min guard for stand>sit due to fatigue/decreased safety technique. Ambulation/Gait Ambulation/Gait assistance: Min guard, +2 safety/equipment Gait Distance (Feet): 140 Feet (8f x2 with standing break between (1051ftotal), seated break, then 7575f2 with standing break between (140f55ftal)) Assistive device: 4-wheeled walker Gait Pattern/deviations: Step-through pattern, Decreased stride length, Wide base of support General Gait Details: Pt with low steps, somewhat stiff-knee gait pattern due to B knee pain but improved knee flexion as distance progressed, no buckling this date but remains quick to fatigue and increased work of breathing with exertion. Chair follow for safety and he needed 1 seated break ~3 minutes and 2 standing breaks ~30 sec each trial. SpO2 92% on RA with exertion and 99% on RA at rest. Needs cues for use of brakes with rollator and somewhat unsafe with stand>sit due to fatigue needing min guard  for safety. Gait velocity: Reduced Gait velocity interpretation: 1.31 - 2.62 ft/sec, indicative of limited community ambulator    ADL: ADL Overall ADL's : Needs assistance/impaired Eating/Feeding:  Independent, Bed level Grooming: Modified independent, Sitting, Wash/dry hands Upper Body Bathing: Minimal assistance, Sitting Lower Body Bathing: Maximal assistance, Sit to/from stand Upper Body Dressing : Set up, Sitting Lower Body Dressing: Minimal assistance, Sit to/from stand, With adaptive equipment Lower Body Dressing Details (indicate cue type and reason): pt able to bend to feet to manage sock but noted increased difficulty breathing and safety concerns with leaning forward. Educated on use of Reacher and sock aide with pt able to return demo with Min A. Toilet Transfer: Min guard, Ambulation, RW, Regular Toilet, Personal assistant Details (indicate cue type and reason): increased effort for power up and lowering down to regular toilet, use of grab bars Toileting- Clothing Manipulation and Hygiene: Supervision/safety, Sit to/from stand, Sitting/lateral lean Toileting - Clothing Manipulation Details (indicate cue type and reason): able to perform hygiene, clothing mgmt with increased time Functional mobility during ADLs: Min guard, Rolling walker General ADL Comments: Pt progressing well towards OT goals, limited by chronic knee pain. Discussed ADL routine and home setup with pt reporting need for shower chair (educated on pain mgmt and energy conservation benefits) and RW for stability/pain mgmt with mobility  Cognition: Cognition Overall Cognitive Status: Within Functional Limits for tasks assessed Orientation Level: Oriented X4 Cognition Arousal/Alertness: Awake/alert Behavior During Therapy: WFL for tasks assessed/performed Overall Cognitive Status: Within Functional Limits for tasks assessed General Comments: pleasant, cooperative  Physical Exam: Blood pressure 93/79,  pulse 94, temperature 98.5 F (36.9 C), temperature source Oral, resp. rate (!) 22, height '5\' 8"'  (1.727 m), weight (!) 136.3 kg, SpO2 95 %. Gen: no distress, normal appearing, BMI 45.69 HEENT: oral mucosa pink and moist, NCAT Cardio: Reg rate Chest: normal effort, tachypneic Abd: soft, non-distended Ext: no edema Psych: pleasant, normal affect Skin: intact Neurological:     Comments: Patient is alert.  Appears a bit fatigued.  Oriented x3 and follows commands.   Results for orders placed or performed during the hospital encounter of 04/29/21 (from the past 48 hour(s))  Glucose, capillary     Status: Abnormal   Collection Time: 05/20/21 11:26 AM  Result Value Ref Range   Glucose-Capillary 125 (H) 70 - 99 mg/dL    Comment: Glucose reference range applies only to samples taken after fasting for at least 8 hours.   Comment 1 Notify RN    Comment 2 Document in Chart   Glucose, capillary     Status: Abnormal   Collection Time: 05/20/21  4:03 PM  Result Value Ref Range   Glucose-Capillary 179 (H) 70 - 99 mg/dL    Comment: Glucose reference range applies only to samples taken after fasting for at least 8 hours.  Glucose, capillary     Status: Abnormal   Collection Time: 05/20/21  9:08 PM  Result Value Ref Range   Glucose-Capillary 141 (H) 70 - 99 mg/dL    Comment: Glucose reference range applies only to samples taken after fasting for at least 8 hours.   Comment 1 Notify RN    Comment 2 Document in Chart   Glucose, capillary     Status: Abnormal   Collection Time: 05/21/21  6:23 AM  Result Value Ref Range   Glucose-Capillary 150 (H) 70 - 99 mg/dL    Comment: Glucose reference range applies only to samples taken after fasting for at least 8 hours.  Basic metabolic panel     Status: Abnormal   Collection Time: 05/21/21  7:18 AM  Result Value Ref Range   Sodium 134 (  L) 135 - 145 mmol/L   Potassium 4.1 3.5 - 5.1 mmol/L   Chloride 106 98 - 111 mmol/L   CO2 21 (L) 22 - 32 mmol/L    Glucose, Bld 160 (H) 70 - 99 mg/dL    Comment: Glucose reference range applies only to samples taken after fasting for at least 8 hours.   BUN 26 (H) 6 - 20 mg/dL   Creatinine, Ser 1.58 (H) 0.61 - 1.24 mg/dL   Calcium 9.6 8.9 - 10.3 mg/dL   GFR, Estimated 54 (L) >60 mL/min    Comment: (NOTE) Calculated using the CKD-EPI Creatinine Equation (2021)    Anion gap 7 5 - 15    Comment: Performed at [facility] [address] [place], [place] [address]  Glucose, capillary     Status: Abnormal   Collection Time: 05/21/21 11:25 AM  Result Value Ref Range   Glucose-Capillary 184 (H) 70 - 99 mg/dL    Comment: Glucose reference range applies only to samples taken after fasting for at least 8 hours.  Glucose, capillary     Status: Abnormal   Collection Time: 05/21/21  5:21 PM  Result Value Ref Range   Glucose-Capillary 139 (H) 70 - 99 mg/dL    Comment: Glucose reference range applies only to samples taken after fasting for at least 8 hours.  Glucose, capillary     Status: Abnormal   Collection Time: 05/21/21  8:50 PM  Result Value Ref Range   Glucose-Capillary 155 (H) 70 - 99 mg/dL    Comment: Glucose reference range applies only to samples taken after fasting for at least 8 hours.   Comment 1 Notify RN    Comment 2 Document in Chart   Basic metabolic panel     Status: Abnormal   Collection Time: 05/22/21  3:55 AM  Result Value Ref Range   Sodium 135 135 - 145 mmol/L   Potassium 3.4 (L) 3.5 - 5.1 mmol/L   Chloride 100 98 - 111 mmol/L   CO2 23 22 - 32 mmol/L   Glucose, Bld 142 (H) 70 - 99 mg/dL    Comment: Glucose reference range applies only to samples taken after fasting for at least 8 hours.   BUN 31 (H) 6 - 20 mg/dL   Creatinine, Ser 1.96 (H) 0.61 - 1.24 mg/dL   Calcium 9.6 8.9 - 10.3 mg/dL   GFR, Estimated 42 (L) >60 mL/min    Comment: (NOTE) Calculated using the CKD-EPI Creatinine Equation (2021)    Anion gap 12 5 - 15    Comment: Performed at [facility] [address], [place], [place] [address]  Glucose, capillary     Status: Abnormal   Collection Time: 05/22/21  6:21 AM  Result Value Ref Range   Glucose-Capillary 156 (H) 70 - 99 mg/dL    Comment: Glucose reference range applies only to samples taken after fasting for at least 8 hours.   Comment 1 Notify RN    Comment 2 Document in Chart    No results found.     Medical Problem List and Plan: 1.  Debility secondary to acute on chronic systolic congestive heart failure/cardiogenic and ultimate plan for LVAD  -patient may shower  -ELOS/Goals: 5-7 days modI 2.  Antithrombotics: -DVT/anticoagulation: Eliquis [employer]: Other (comment)  -antiplatelet therapy: N/A 3. Pain Management: Tramadol as needed 4. Depression: Continue Zoloft 25 mg daily, 5. Neuropsych: This patient is capable of making decisions on his own behalf. 6.  Skin/Wound Care: Routine skin checks 7. Fluids/Electrolytes/Nutrition: Routine in and outs with follow-up chemistries 8.  PAF.  Continue Eliquis.  Amiodarone 200 mg twice daily.  Patient underwent emergent TEE guided DCCV 7/27.  Cardiac rate controlled. 9.  Acute hypoxemic respiratory failure.  Resolved.  Antibiotic therapy completed. 10.  AKI on CKD stage III.  Follow-up chemistries 11.  OSA.  CPAP 12.  Gout.  Prednisone colchicine completed.  Continue allopurinol 13.  Diabetes mellitus.  Jardiance 10 mg daily, NovoLog 6 units 3 times daily, Levemir 50 units twice daily.  Diabetic teaching 14.  Hypertension.  Cozaar 25 mg daily.  Monitor with increased mobility 15.  Acute exacerbation CHF.  Aldactone 25 mg daily, Demadex 40 mg Monday Wednesday Friday.  Monitor for any signs of fluid overload 16.  Obesity.  BMI 47.  Dietary follow-up 17. Insomnia: continue trazodone 194m HS 18. Anxiety: continue Xanax 0.538mBID 19. Disposition: HFU scheduled 06/23/21 to arrive at 11:20 for 11:40   I have personally performed a face to face diagnostic evaluation, including, but  not limited to relevant history and physical exam findings, of this patient and developed relevant assessment and plan.  Additionally, I have reviewed and concur with the physician assistant's documentation above.  KrLeeroy ChaMD   DaLavon PaganininAledoPA-C 05/22/2021

## 2021-05-21 NOTE — Progress Notes (Signed)
Mobility Specialist: Progress Note   05/21/21 1701  Mobility  Activity Ambulated in hall  Level of Assistance Modified independent, requires aide device or extra time  Assistive Device Four wheel walker  Distance Ambulated (ft) 250 ft (50'x2 + 75'x2)  Mobility Ambulated with assistance in hallway  Mobility Response Tolerated well  Mobility performed by Mobility specialist;Other (comment) (PTA [place])   Pt independent with bed mobility as well as to stand. Pt stopped for a few standing breaks d/t fatigue, otherwise asx. Pt sitting EOB with call Mccadden at his side and is set-up with his dinner.   [facility] Zailynn Brandel Mobility Specialist Mobility Specialist Phone: [phone]

## 2021-05-21 NOTE — Progress Notes (Signed)
Occupational Therapy Treatment Patient Details Name: Jeffrey [name] MRN: [id number] DOB: 1976/03/21 Today's Date: 05/21/2021    History of present illness 45yo admitted with nausea and weight gain 7/26. Pt with Afib with RVR leading to CHF exacerbation, cardiogenic shock and respiratory failure. 7/27 cardioversion with intubation, extubated 8/4. 8/6 cardioversion. Hospital course complicated by gout in B feet and R elbow. 8/9 cardiac cath. PMhx: poorly controlled HTN, CKD stage 3a, PAF, DM2, morbid obesity, chronic systolic CHF, NICM s/p S-ICD, right renal cell CA s/p nephrectomy.   OT comments  Pt progressing well towards OT goals, able to demo mobility to/from bathroom using RW at min guard and complete toileting task at Supervision level. Pt continues to be limited by B knee pain, so educated on AE use for LB dressing tasks with Min A required. Educated on use of shower chair for energy conservation and pain mgmt, as well as RW use. DME recs updated to reflect. Pt remains motivated to regain independence - continue to rec CIR.    Follow Up Recommendations  CIR    Equipment Recommendations  Tub/shower seat;Other (comment) (bariatric RW)    Recommendations for Other Services Rehab consult    Precautions / Restrictions Precautions Precautions: Fall Precaution Comments: B knee pain Restrictions Weight Bearing Restrictions: No       Mobility Bed Mobility Overal bed mobility: Needs Assistance Bed Mobility: Supine to Sit     Supine to sit: Supervision;HOB elevated     General bed mobility comments: increased time/effort    Transfers Overall transfer level: Needs assistance Equipment used: Rolling walker (2 wheeled) Transfers: Sit to/from Stand Sit to Stand: Supervision         General transfer comment: from low bed    Balance Overall balance assessment: Needs assistance Sitting-balance support: No upper extremity supported;Feet unsupported Sitting balance-Leahy Scale:  Good     Standing balance support: Bilateral upper extremity supported Standing balance-Leahy Scale: Poor Standing balance comment: Relies on BUE support of RW                           ADL either performed or assessed with clinical judgement   ADL Overall ADL's : Needs assistance/impaired     Grooming: Modified independent;Sitting;Wash/dry hands               Lower Body Dressing: Minimal assistance;Sit to/from stand;With adaptive equipment Lower Body Dressing Details (indicate cue type and reason): pt able to bend to feet to manage sock but noted increased difficulty breathing and safety concerns with leaning forward. Educated on use of Reacher and sock aide with pt able to return demo with Min A. Toilet Transfer: Min guard;Ambulation;RW;Regular Toilet;Grab bars Toilet Transfer Details (indicate cue type and reason): increased effort for power up and lowering down to regular toilet, use of grab bars Toileting- Clothing Manipulation and Hygiene: Supervision/safety;Sit to/from stand;Sitting/lateral lean Toileting - Clothing Manipulation Details (indicate cue type and reason): able to perform hygiene, clothing mgmt with increased time     Functional mobility during ADLs: Min guard;Rolling walker General ADL Comments: Pt progressing well towards OT goals, limited by chronic knee pain. Discussed ADL routine and home setup with pt reporting need for shower chair (educated on pain mgmt and energy conservation benefits) and RW for stability/pain mgmt with mobility     Vision   Vision Assessment?: No apparent visual deficits   Perception     Praxis      Cognition Arousal/Alertness: Awake/alert Behavior  During Therapy: WFL for tasks assessed/performed Overall Cognitive Status: Within Functional Limits for tasks assessed                                 General Comments: pleasant, cooperative        Exercises     Shoulder Instructions        General Comments VSS on RA    Pertinent Vitals/ Pain       Pain Assessment: Faces Faces Pain Scale: Hurts little more Pain Location: B knee pain in standing R >L Pain Descriptors / Indicators: Discomfort;Aching Pain Intervention(s): Monitored during session;Limited activity within patient's tolerance;Premedicated before session  Home Living                                          Prior Functioning/Environment              Frequency  Min 2X/week        Progress Toward Goals  OT Goals(current goals can now be found in the care plan section)  Progress towards OT goals: Progressing toward goals  Acute Rehab OT Goals Patient Stated Goal: return to independence OT Goal Formulation: With patient Time For Goal Achievement: 05/26/21 Potential to Achieve Goals: Good ADL Goals Pt Will Perform Grooming: with min assist;standing Pt Will Perform Lower Body Bathing: with min assist;with adaptive equipment;sit to/from stand Pt Will Perform Lower Body Dressing: with min assist;with adaptive equipment;sit to/from stand Pt Will Perform Toileting - Clothing Manipulation and hygiene: with min assist;sit to/from stand  Plan Discharge plan remains appropriate    Co-evaluation                 AM-PAC OT "6 Clicks" Daily Activity     Outcome Measure   Help from another person eating meals?: None Help from another person taking care of personal grooming?: A Little Help from another person toileting, which includes using toliet, bedpan, or urinal?: A Little Help from another person bathing (including washing, rinsing, drying)?: A Lot Help from another person to put on and taking off regular upper body clothing?: A Little Help from another person to put on and taking off regular lower body clothing?: A Little 6 Click Score: 18    End of Session Equipment Utilized During Treatment: Rolling walker  OT Visit Diagnosis: Pain;Muscle weakness (generalized)  (M62.81) Pain - Right/Left: Right Pain - part of body: Knee   Activity Tolerance Patient tolerated treatment well   Patient Left in chair;with call [place]/phone within reach   Nurse Communication Mobility status        Time: [id number] OT Time Calculation (min): 36 min  Charges: OT General Charges $OT Visit: 1 Visit OT Treatments $Self Care/Home Management : 23-37 mins  Malachy Chamber, OTR/L Acute Rehab Services Office: [phone]    Layla Maw 05/21/2021, 10:31 AM

## 2021-05-21 NOTE — Progress Notes (Signed)
[id number] Offered walk. Pt stated  he worked with OT and will work with PT later. He is happy about CIR as his wife just told him. Gave pt low sodium diets and discussed 2000 mg sodium restriction. Pt stated he has CHF booklet. Encouraged him to review zones. Graylon Good RN BSN 05/21/2021 12:48 PM

## 2021-05-21 NOTE — TOC CM/SW Note (Signed)
HF TOC CM updated [place] that pt will be going to CIR, HH was reviewing for [facility] if pt was scheduled to dc home. Pt will need HH after CIR complete. [place], Heart Failure TOC CM [phone]

## 2021-05-21 NOTE — Progress Notes (Signed)
Physical Therapy Treatment Patient Details Name: Jeffrey [name] MRN: [id number] DOB: 1975/11/06 Today's Date: 05/21/2021    History of Present Illness 45yo admitted with nausea and weight gain 7/26. Pt with Afib with RVR leading to CHF exacerbation, cardiogenic shock and respiratory failure. 7/27 cardioversion with intubation, extubated 8/4. 8/6 cardioversion. Hospital course complicated by gout in B feet and R elbow. 8/9 cardiac cath. PMhx: poorly controlled HTN, CKD stage 3a, PAF, DM2, morbid obesity, chronic systolic CHF, NICM s/p S-ICD, right renal cell CA s/p nephrectomy.    PT Comments    Pt received in supine, agreeable to therapy session and with good participation and tolerance for hallway gait progression. Pt utilized rollator this session but needs min safety cues for use of brakes and safe hand placement due to fatigue when sitting and dyspnea. Pt VSS on RA, HR slightly elevated to 121 bpm with exertion. Pt able to progress up to 34f x2 with standing break and reports feeling 7/10 modified RPE (fatigue) at end of session. Pt continues to benefit from PT services to progress toward functional mobility goals.    Follow Up Recommendations  CIR (vs SNF if unable to get CIR clearance)     Equipment Recommendations  Rolling walker with 5" wheels;3in1 (PT);Other (comment) (bariatric size)    Recommendations for Other Services       Precautions / Restrictions Precautions Precautions: Fall Precaution Comments: B knee pain Required Braces or Orthoses:  (he has sneakers in room's closet) Restrictions Weight Bearing Restrictions: No    Mobility  Bed Mobility Overal bed mobility: Needs Assistance Bed Mobility: Supine to Sit     Supine to sit: Modified independent (Device/Increase time)     General bed mobility comments: increased time/effort    Transfers Overall transfer level: Needs assistance Equipment used: 4-wheeled walker Transfers: Sit to/from Stand Sit to Stand:  Supervision;Min guard         General transfer comment: from low bed to rollator with Supervision but min guard for stand>sit due to fatigue/decreased safety technique.  Ambulation/Gait Ambulation/Gait assistance: Min guard;+2 safety/equipment Gait Distance (Feet): 140 Feet (596fx2 with standing break between (10071fotal), seated break, then 75f36f with standing break between (140ft59fal)) Assistive device: 4-wheeled walker Gait Pattern/deviations: Step-through pattern;Decreased stride length;Wide base of support     General Gait Details: Pt with low steps, somewhat stiff-knee gait pattern due to B knee pain but improved knee flexion as distance progressed, no buckling this date but remains quick to fatigue and increased work of breathing with exertion. Chair follow for safety and he needed 1 seated break ~3 minutes and 2 standing breaks ~30 sec each trial. SpO2 92% on RA with exertion and 99% on RA at rest. Needs cues for use of brakes with rollator and somewhat unsafe with stand>sit due to fatigue needing min guard for safety.   Stairs             Wheelchair Mobility    Modified Rankin (Stroke Patients Only)       Balance Overall balance assessment: Needs assistance Sitting-balance support: No upper extremity supported;Feet unsupported Sitting balance-Leahy Scale: Good     Standing balance support: Bilateral upper extremity supported Standing balance-Leahy Scale: Poor Standing balance comment: Relies on BUE support of RW                            Cognition Arousal/Alertness: Awake/alert Behavior During Therapy: WFL for tasks assessed/performed Overall Cognitive  Status: Within Functional Limits for tasks assessed                                 General Comments: pleasant, cooperative      Exercises Other Exercises Other Exercises: pt reporting he has been completing seated/supine therex already today.    General Comments General  comments (skin integrity, edema, etc.): HR max 121 bpm and SpO2 92-99% on RA; no dizziness reported      Pertinent Vitals/Pain Pain Assessment: Faces Pain Score: 8  Pain Location: B knee pain in standing R >L Pain Descriptors / Indicators: Discomfort;Aching Pain Intervention(s): Limited activity within patient's tolerance;Monitored during session;Repositioned;Other (comment) (offered to premedicate but pt wanting to walk first)    Home Living                      Prior Function            PT Goals (current goals can now be found in the care plan section) Acute Rehab PT Goals Patient Stated Goal: return to independence PT Goal Formulation: With patient Time For Goal Achievement: 05/23/21 Progress towards PT goals: Progressing toward goals    Frequency    Min 3X/week      PT Plan Current plan remains appropriate    Co-evaluation              AM-PAC PT "6 Clicks" Mobility   Outcome Measure  Help needed turning from your back to your side while in a flat bed without using bedrails?: None Help needed moving from lying on your back to sitting on the side of a flat bed without using bedrails?: None Help needed moving to and from a bed to a chair (including a wheelchair)?: A Little Help needed standing up from a chair using your arms (e.g., wheelchair or bedside chair)?: A Little Help needed to walk in hospital room?: A Little Help needed climbing 3-5 steps with a railing? : Total 6 Click Score: 18    End of Session Equipment Utilized During Treatment: Gait belt Activity Tolerance: Patient tolerated treatment well Patient left: in bed;with call Janv[place]/phone within reach;Other (comment) (pt seated EOB, RN notified he is requesting bed alarm off but remains moderate fall risk she plans to discuss with him) Nurse Communication: Mobility status PT Visit Diagnosis: Other abnormalities of gait and mobility (R26.89);Difficulty in walking, not elsewhere classified  (R26.2);Muscle weakness (generalized) (M62.81)     Time: [id number] PT Time Calculation (min) (ACUTE ONLY): 24 min  Charges:  $Gait Training: 23-37 mins                     Kionte Baumgardner P., PTA Acute Rehabilitation Services Pager: [phone] Office: [place] 05/21/2021, 5:40 PM

## 2021-05-21 NOTE — Progress Notes (Signed)
Inpatient Rehab Admissions Coordinator:   Received word from primary team that peer to peer was completed and CIR is now approved.  Unfortunately, I do not have a bed for this patient today.  Will follow for possible admit tomorrow.    Shann Medal, PT, DPT Admissions Coordinator [phone] 05/21/21  10:00 AM

## 2021-05-21 NOTE — TOC Progression Note (Addendum)
Transition of Care (TOC) - Progression Note  Heart Failure  Patient Details  Name: Jeffrey [name] MRN: [id number] Date of Birth: Feb 21, 1976  Transition of Care [facility]) CM/SW [place], [place] Phone Number: 05/21/2021, 10:19 AM  Clinical Narrative:    CSW spoke with [facility] [phone] and informed them that Jeffrey [name] was approved for CIR. [facility] reported understanding and will take him off the list. Peer to peer approved CIR and Jeffrey [name] with discharge to CIR once a bed is available. CSW spoke with the patient's wife, [name] about the latest update with CIR and she was very thankful and grateful for Dr. [name] for getting the approval through. [name] reported that her husband has been the primary caregiver for their 46yo and it has been a struggle now that she is back at work and wondering if there are any options for support. CSW outreached social worker, Michiel Cowboy regarding child care resources for the patient and his wife. CSW provided [facility] with information about the [place] Referral Line to speak with a Parent Counselor who can give a referral to licensed child care facilities along with other resource information. Call [phone] or [phone] or email [email or web address] for income based child care support provided to [facility] [phone], via phone and e-mail.   CSW will continue to follow throughout discharge.   Expected Discharge Plan: [place] Barriers to Discharge: Continued Medical Work up  Expected Discharge Plan and Services Expected Discharge Plan: [place] In-house Referral: Clinical Social Work Discharge Planning Services: CM Consult Post Acute Care Choice: [place] arrangements for the past 2 months: Single Family Home                 DME Arranged: 3-N-1, Walker rolling with seat, Shower stool DME Agency: [employer]                   Social Determinants  of Health (SDOH) Interventions Food Insecurity Interventions: Assist with [employer] Application Financial Strain Interventions: Other (Comment) (Referral to [facility] for Disability and Food Stamps) Housing Interventions: Intervention Not Indicated Transportation Interventions: [employer]  Readmission Risk Interventions No flowsheet data found.  Ailana Cuadrado, MSW, [place] Heart Failure Social Worker

## 2021-05-22 ENCOUNTER — Other Ambulatory Visit: Payer: Self-pay

## 2021-05-22 ENCOUNTER — Encounter (HOSPITAL_COMMUNITY): Payer: Self-pay | Admitting: Physical Medicine and Rehabilitation

## 2021-05-22 ENCOUNTER — Inpatient Hospital Stay (HOSPITAL_COMMUNITY)
Admission: RE | Admit: 2021-05-22 | Discharge: 2021-05-28 | DRG: 945 | Disposition: A | Discharge status: Home / Self Care | Payer: Medicaid Other | Source: Intra-hospital | Attending: Physical Medicine and Rehabilitation | Admitting: Physical Medicine and Rehabilitation

## 2021-05-22 DIAGNOSIS — M25561 Pain in right knee: Secondary | ICD-10-CM | POA: Diagnosis not present

## 2021-05-22 DIAGNOSIS — J45909 Unspecified asthma, uncomplicated: Secondary | ICD-10-CM | POA: Diagnosis not present

## 2021-05-22 DIAGNOSIS — G4700 Insomnia, unspecified: Secondary | ICD-10-CM | POA: Diagnosis present

## 2021-05-22 DIAGNOSIS — R570 Cardiogenic shock: Secondary | ICD-10-CM | POA: Diagnosis not present

## 2021-05-22 DIAGNOSIS — Z79899 Other long term (current) drug therapy: Secondary | ICD-10-CM

## 2021-05-22 DIAGNOSIS — I959 Hypotension, unspecified: Secondary | ICD-10-CM | POA: Diagnosis not present

## 2021-05-22 DIAGNOSIS — M109 Gout, unspecified: Secondary | ICD-10-CM | POA: Diagnosis present

## 2021-05-22 DIAGNOSIS — Z905 Acquired absence of kidney: Secondary | ICD-10-CM

## 2021-05-22 DIAGNOSIS — N183 Chronic kidney disease, stage 3 unspecified: Secondary | ICD-10-CM

## 2021-05-22 DIAGNOSIS — Z8249 Family history of ischemic heart disease and other diseases of the circulatory system: Secondary | ICD-10-CM

## 2021-05-22 DIAGNOSIS — F17210 Nicotine dependence, cigarettes, uncomplicated: Secondary | ICD-10-CM | POA: Diagnosis not present

## 2021-05-22 DIAGNOSIS — E876 Hypokalemia: Secondary | ICD-10-CM | POA: Diagnosis not present

## 2021-05-22 DIAGNOSIS — K5900 Constipation, unspecified: Secondary | ICD-10-CM | POA: Diagnosis not present

## 2021-05-22 DIAGNOSIS — F32A Depression, unspecified: Secondary | ICD-10-CM | POA: Diagnosis present

## 2021-05-22 DIAGNOSIS — Z794 Long term (current) use of insulin: Secondary | ICD-10-CM

## 2021-05-22 DIAGNOSIS — Z85528 Personal history of other malignant neoplasm of kidney: Secondary | ICD-10-CM

## 2021-05-22 DIAGNOSIS — F419 Anxiety disorder, unspecified: Secondary | ICD-10-CM | POA: Diagnosis not present

## 2021-05-22 DIAGNOSIS — E7800 Pure hypercholesterolemia, unspecified: Secondary | ICD-10-CM | POA: Diagnosis present

## 2021-05-22 DIAGNOSIS — Z7984 Long term (current) use of oral hypoglycemic drugs: Secondary | ICD-10-CM

## 2021-05-22 DIAGNOSIS — I5043 Acute on chronic combined systolic (congestive) and diastolic (congestive) heart failure: Secondary | ICD-10-CM | POA: Diagnosis present

## 2021-05-22 DIAGNOSIS — Z7901 Long term (current) use of anticoagulants: Secondary | ICD-10-CM

## 2021-05-22 DIAGNOSIS — Z886 Allergy status to analgesic agent status: Secondary | ICD-10-CM

## 2021-05-22 DIAGNOSIS — R21 Rash and other nonspecific skin eruption: Secondary | ICD-10-CM | POA: Diagnosis not present

## 2021-05-22 DIAGNOSIS — Z91030 Bee allergy status: Secondary | ICD-10-CM

## 2021-05-22 DIAGNOSIS — I4891 Unspecified atrial fibrillation: Secondary | ICD-10-CM | POA: Diagnosis not present

## 2021-05-22 DIAGNOSIS — I130 Hypertensive heart and chronic kidney disease with heart failure and stage 1 through stage 4 chronic kidney disease, or unspecified chronic kidney disease: Secondary | ICD-10-CM | POA: Diagnosis not present

## 2021-05-22 DIAGNOSIS — E1122 Type 2 diabetes mellitus with diabetic chronic kidney disease: Secondary | ICD-10-CM | POA: Diagnosis present

## 2021-05-22 DIAGNOSIS — Z888 Allergy status to other drugs, medicaments and biological substances status: Secondary | ICD-10-CM

## 2021-05-22 DIAGNOSIS — R5381 Other malaise: Secondary | ICD-10-CM | POA: Diagnosis not present

## 2021-05-22 DIAGNOSIS — Z6841 Body Mass Index (BMI) 40.0 and over, adult: Secondary | ICD-10-CM

## 2021-05-22 DIAGNOSIS — Z833 Family history of diabetes mellitus: Secondary | ICD-10-CM

## 2021-05-22 DIAGNOSIS — I428 Other cardiomyopathies: Secondary | ICD-10-CM | POA: Diagnosis present

## 2021-05-22 DIAGNOSIS — N179 Acute kidney failure, unspecified: Secondary | ICD-10-CM | POA: Diagnosis present

## 2021-05-22 DIAGNOSIS — J9601 Acute respiratory failure with hypoxia: Secondary | ICD-10-CM | POA: Diagnosis not present

## 2021-05-22 DIAGNOSIS — Z95810 Presence of automatic (implantable) cardiac defibrillator: Secondary | ICD-10-CM

## 2021-05-22 DIAGNOSIS — G4733 Obstructive sleep apnea (adult) (pediatric): Secondary | ICD-10-CM | POA: Diagnosis present

## 2021-05-22 DIAGNOSIS — Z79891 Long term (current) use of opiate analgesic: Secondary | ICD-10-CM

## 2021-05-22 DIAGNOSIS — Z91018 Allergy to other foods: Secondary | ICD-10-CM

## 2021-05-22 DIAGNOSIS — I480 Paroxysmal atrial fibrillation: Secondary | ICD-10-CM | POA: Diagnosis present

## 2021-05-22 DIAGNOSIS — N170 Acute kidney failure with tubular necrosis: Secondary | ICD-10-CM | POA: Diagnosis not present

## 2021-05-22 DIAGNOSIS — N1831 Chronic kidney disease, stage 3a: Secondary | ICD-10-CM | POA: Diagnosis not present

## 2021-05-22 LAB — GLUCOSE, CAPILLARY
Glucose-Capillary: 156 mg/dL — ABNORMAL HIGH (ref 70–99)
Glucose-Capillary: 121 mg/dL — ABNORMAL HIGH (ref 70–99)
Glucose-Capillary: 128 mg/dL — ABNORMAL HIGH (ref 70–99)

## 2021-05-22 LAB — BASIC METABOLIC PANEL
Anion gap: 12 (ref 5–15)
BUN: 31 mg/dL — ABNORMAL HIGH (ref 6–20)
CO2: 23 mmol/L (ref 22–32)
Calcium: 9.6 mg/dL (ref 8.9–10.3)
Chloride: 100 mmol/L (ref 98–111)
Creatinine, Ser: 1.96 mg/dL — ABNORMAL HIGH (ref 0.61–1.24)
GFR, Estimated: 42 mL/min — ABNORMAL LOW (ref >60)
Glucose, Bld: 142 mg/dL — ABNORMAL HIGH (ref 70–99)
Potassium: 3.4 mmol/L — ABNORMAL LOW (ref 3.5–5.1)
Sodium: 135 mmol/L (ref 135–145)

## 2021-05-22 MED ORDER — INSULIN ASPART 100 UNIT/ML IJ SOLN
0.0000 [IU] | Freq: Three times a day (TID) | INTRAMUSCULAR | Status: DC
  Administered 2021-05-22 – 2021-05-27 (×10): 3 [IU] via SUBCUTANEOUS

## 2021-05-22 MED ORDER — DOCUSATE SODIUM 100 MG PO CAPS: 100.0000 mg | ORAL_CAPSULE | Freq: Two times a day (BID) | ORAL | 0 refills | Status: DC

## 2021-05-22 MED ORDER — POTASSIUM CHLORIDE CRYS ER 20 MEQ PO TBCR
40.0000 meq | EXTENDED_RELEASE_TABLET | Freq: Once | ORAL | Status: AC
  Administered 2021-05-22: 40 meq via ORAL
  Filled 2021-05-22: qty 2

## 2021-05-22 MED ORDER — SERTRALINE HCL 25 MG PO TABS: 25.0000 mg | ORAL_TABLET | Freq: Every day | ORAL | Status: DC

## 2021-05-22 MED ORDER — ONDANSETRON HCL 4 MG/2ML IJ SOLN: 4.0000 mg | Freq: Four times a day (QID) | INTRAMUSCULAR | 0 refills | Status: DC | PRN

## 2021-05-22 MED ORDER — DOCUSATE SODIUM 100 MG PO CAPS
100.0000 mg | ORAL_CAPSULE | Freq: Two times a day (BID) | ORAL | Status: DC
  Administered 2021-05-22 – 2021-05-28 (×12): 100 mg via ORAL
  Filled 2021-05-22 (×12): qty 1

## 2021-05-22 MED ORDER — ALPRAZOLAM 0.5 MG PO TABS
0.5000 mg | ORAL_TABLET | Freq: Two times a day (BID) | ORAL | Status: DC
  Administered 2021-05-22 – 2021-05-28 (×12): 0.5 mg via ORAL
  Filled 2021-05-22 (×12): qty 1

## 2021-05-22 MED ORDER — SPIRONOLACTONE 25 MG PO TABS
25.0000 mg | ORAL_TABLET | Freq: Every day | ORAL | Status: DC
  Administered 2021-05-23 – 2021-05-28 (×6): 25 mg via ORAL
  Filled 2021-05-22 (×6): qty 1

## 2021-05-22 MED ORDER — APIXABAN 5 MG PO TABS
5.0000 mg | ORAL_TABLET | Freq: Two times a day (BID) | ORAL | Status: DC
  Administered 2021-05-22 – 2021-05-28 (×12): 5 mg via ORAL
  Filled 2021-05-22 (×12): qty 1

## 2021-05-22 MED ORDER — NITROGLYCERIN 0.4 MG SL SUBL: 0.4000 mg | SUBLINGUAL_TABLET | SUBLINGUAL | 12 refills | Status: DC | PRN

## 2021-05-22 MED ORDER — INSULIN ASPART 100 UNIT/ML IJ SOLN: 6.0000 [IU] | Freq: Three times a day (TID) | INTRAMUSCULAR | 11 refills | Status: DC

## 2021-05-22 MED ORDER — AMIODARONE HCL 200 MG PO TABS
200.0000 mg | ORAL_TABLET | Freq: Two times a day (BID) | ORAL | Status: DC
  Administered 2021-05-22 – 2021-05-27 (×10): 200 mg via ORAL
  Filled 2021-05-22 (×10): qty 1

## 2021-05-22 MED ORDER — INSULIN ASPART 100 UNIT/ML IJ SOLN: 0.0000 [IU] | Freq: Every day | INTRAMUSCULAR | 11 refills | Status: DC

## 2021-05-22 MED ORDER — NITROGLYCERIN 0.4 MG SL SUBL: 0.4000 mg | SUBLINGUAL_TABLET | SUBLINGUAL | Status: DC | PRN

## 2021-05-22 MED ORDER — LOSARTAN POTASSIUM 25 MG PO TABS: 25.0000 mg | ORAL_TABLET | Freq: Every day | ORAL | Status: DC

## 2021-05-22 MED ORDER — PANTOPRAZOLE SODIUM 40 MG PO TBEC
40.0000 mg | DELAYED_RELEASE_TABLET | Freq: Every day | ORAL | Status: DC
  Administered 2021-05-23 – 2021-05-28 (×6): 40 mg via ORAL
  Filled 2021-05-22 (×6): qty 1

## 2021-05-22 MED ORDER — PANTOPRAZOLE SODIUM 40 MG PO TBEC: 40.0000 mg | DELAYED_RELEASE_TABLET | Freq: Every day | ORAL | Status: DC

## 2021-05-22 MED ORDER — INSULIN DETEMIR 100 UNIT/ML ~~LOC~~ SOLN
50.0000 [IU] | Freq: Two times a day (BID) | SUBCUTANEOUS | Status: DC
  Administered 2021-05-22 – 2021-05-28 (×12): 50 [IU] via SUBCUTANEOUS
  Filled 2021-05-22 (×13): qty 0.5

## 2021-05-22 MED ORDER — TRAMADOL HCL 50 MG PO TABS
100.0000 mg | ORAL_TABLET | Freq: Two times a day (BID) | ORAL | Status: DC | PRN
  Administered 2021-05-23 – 2021-05-25 (×3): 100 mg via ORAL
  Filled 2021-05-22 (×3): qty 2

## 2021-05-22 MED ORDER — TRAZODONE HCL 100 MG PO TABS
100.0000 mg | ORAL_TABLET | Freq: Every day | ORAL | Status: DC
  Administered 2021-05-22 – 2021-05-27 (×6): 100 mg via ORAL
  Filled 2021-05-22 (×6): qty 1

## 2021-05-22 MED ORDER — LOSARTAN POTASSIUM 25 MG PO TABS
25.0000 mg | ORAL_TABLET | Freq: Every day | ORAL | Status: DC
  Filled 2021-05-22: qty 1

## 2021-05-22 MED ORDER — POLYETHYLENE GLYCOL 3350 17 G PO PACK
17.0000 g | PACK | Freq: Every day | ORAL | Status: DC
  Administered 2021-05-22 – 2021-05-28 (×5): 17 g via ORAL
  Filled 2021-05-22 (×6): qty 1

## 2021-05-22 MED ORDER — SERTRALINE HCL 50 MG PO TABS
25.0000 mg | ORAL_TABLET | Freq: Every day | ORAL | Status: DC
  Administered 2021-05-23 – 2021-05-28 (×6): 25 mg via ORAL
  Filled 2021-05-22 (×6): qty 1

## 2021-05-22 MED ORDER — ALLOPURINOL 100 MG PO TABS
100.0000 mg | ORAL_TABLET | Freq: Every day | ORAL | Status: DC
  Administered 2021-05-23 – 2021-05-28 (×6): 100 mg via ORAL
  Filled 2021-05-22 (×6): qty 1

## 2021-05-22 MED ORDER — LIVING WELL WITH DIABETES BOOK
Freq: Once | Status: AC
  Filled 2021-05-22: qty 1

## 2021-05-22 MED ORDER — ACETAMINOPHEN 325 MG PO TABS
650.0000 mg | ORAL_TABLET | Freq: Four times a day (QID) | ORAL | Status: DC | PRN
  Administered 2021-05-23 – 2021-05-24 (×2): 650 mg via ORAL
  Filled 2021-05-22 (×2): qty 2

## 2021-05-22 MED ORDER — EMPAGLIFLOZIN 10 MG PO TABS
10.0000 mg | ORAL_TABLET | Freq: Every day | ORAL | Status: DC
  Administered 2021-05-23 – 2021-05-28 (×6): 10 mg via ORAL
  Filled 2021-05-22 (×6): qty 1

## 2021-05-22 MED ORDER — AMIODARONE HCL 200 MG PO TABS: 200.0000 mg | ORAL_TABLET | Freq: Two times a day (BID) | ORAL | Status: DC

## 2021-05-22 MED ORDER — INSULIN ASPART 100 UNIT/ML IJ SOLN: 0.0000 [IU] | Freq: Three times a day (TID) | INTRAMUSCULAR | 11 refills | Status: DC

## 2021-05-22 MED ORDER — ALPRAZOLAM 0.5 MG PO TABS: 0.5000 mg | ORAL_TABLET | Freq: Two times a day (BID) | ORAL | 0 refills | Status: DC

## 2021-05-22 MED ORDER — TRAZODONE HCL 100 MG PO TABS: 100.0000 mg | ORAL_TABLET | Freq: Every day | ORAL | Status: DC

## 2021-05-22 MED ORDER — SORBITOL 70 % SOLN: 15.0000 mL | Freq: Every day | Status: DC | PRN

## 2021-05-22 MED ORDER — INSULIN ASPART 100 UNIT/ML IJ SOLN
6.0000 [IU] | Freq: Three times a day (TID) | INTRAMUSCULAR | Status: DC
  Administered 2021-05-22 – 2021-05-28 (×15): 6 [IU] via SUBCUTANEOUS

## 2021-05-22 MED ORDER — TRAMADOL HCL 50 MG PO TABS: 100.0000 mg | ORAL_TABLET | Freq: Two times a day (BID) | ORAL | Status: DC | PRN

## 2021-05-22 MED ORDER — ACETAMINOPHEN 325 MG PO TABS: 650.0000 mg | ORAL_TABLET | Freq: Four times a day (QID) | ORAL | Status: AC | PRN

## 2021-05-22 MED ORDER — INSULIN DETEMIR 100 UNIT/ML ~~LOC~~ SOLN: 50.0000 [IU] | Freq: Two times a day (BID) | SUBCUTANEOUS | 11 refills | Status: DC

## 2021-05-22 NOTE — Progress Notes (Addendum)
Patient ID: Jeffrey [name], male   DOB: 12/06/1975, 45 y.o.   MRN: [id number]     Advanced Heart Failure Rounding Note  PCP-Cardiologist: Glori Bickers, MD   Subjective:    CVP overnight 3-4. Unable to get accurate CVP this am d/t air bubble in line. No dyspnea. Only complaint is chronic arthritic pain in right knee.  Remains in NSR. SBP predominately > 100 after switching Entresto to Losartan.   Able to walk 140 feet yesterday with breaks, still quite weak and requires assistance.   Objective:   Weight Range: (!) 136.3 kg Body mass index is 45.69 kg/m.   Vital Signs:   Temp:  [97.7 F (36.5 C)-98.7 F (37.1 C)] 98.3 F (36.8 C) (08/18 0313) Pulse Rate:  [81-102] 81 (08/18 0313) Resp:  [16-22] 19 (08/18 0313) BP: (93-116)/(70-83) 93/74 (08/18 0313) SpO2:  [93 %-95 %] 95 % (08/18 0313) Weight:  [136.3 kg] 136.3 kg (08/18 0640) Last BM Date: 05/21/21  Weight change: Filed Weights   05/20/21 0500 05/21/21 0258 05/22/21 0640  Weight: (!) 138.7 kg (!) 140.2 kg (!) 136.3 kg    Intake/Output:   Intake/Output Summary (Last 24 hours) at 05/22/2021 0703 Last data filed at 05/22/2021 0300 Gross per 24 hour  Intake 840 ml  Output 2900 ml  Net -2060 ml      Physical Exam   General:  Well appearing. Sitting up in bed. No resp difficulty HEENT: normal Neck: supple. no JVD. Carotids 2+ bilat; no bruits. No lymphadenopathy or thryomegaly appreciated. Left IJ CVC. Cor: PMI nondisplaced. Regular rate & rhythm. No rubs, gallops or murmurs. Lungs: CTA Abdomen: obese soft, nontender, nondistended. No hepatosplenomegaly. No bruits or masses. Good bowel sounds. Extremities: no cyanosis, clubbing, rash, edema Neuro: alert & orientedx3, cranial nerves grossly intact. moves all 4 extremities w/o difficulty. Affect pleasant  Telemetry   NSR 80-90s. Personally reviewed  Labs    CBC Recent Labs    05/20/21 0515  WBC 6.0  HGB 14.5  HCT 44.0  MCV 84.1  PLT 465*   Basic  Metabolic Panel Recent Labs    05/21/21 0718 05/22/21 0355  NA 134* 135  K 4.1 3.4*  CL 106 100  CO2 21* 23  GLUCOSE 160* 142*  BUN 26* 31*  CREATININE 1.58* 1.96*  CALCIUM 9.6 9.6   Liver Function Tests No results for input(s): AST, ALT, ALKPHOS, BILITOT, PROT, ALBUMIN in the last 72 hours.  No results for input(s): LIPASE, AMYLASE in the last 72 hours. Cardiac Enzymes No results for input(s): CKTOTAL, CKMB, CKMBINDEX, TROPONINI in the last 72 hours.  BNP: BNP (last 3 results) Recent Labs    11/21/20 2222 04/29/21 1247  BNP 65.0 1,407.7*    ProBNP (last 3 results) No results for input(s): PROBNP in the last 8760 hours.   D-Dimer No results for input(s): DDIMER in the last 72 hours. Hemoglobin A1C No results for input(s): HGBA1C in the last 72 hours.  Fasting Lipid Panel No results for input(s): CHOL, HDL, LDLCALC, TRIG, CHOLHDL, LDLDIRECT in the last 72 hours.   Thyroid Function Tests No results for input(s): TSH, T4TOTAL, T3FREE, THYROIDAB in the last 72 hours.  Invalid input(s): FREET3   Other results:   Imaging    No results found.   Medications:     Scheduled Medications:  albuterol  2.5 mg Nebulization Once   allopurinol  100 mg Oral Daily   ALPRAZolam  0.5 mg Oral BID   amiodarone  200 mg Oral  BID   apixaban  5 mg Oral BID   Chlorhexidine Gluconate Cloth  6 each Topical Daily   docusate sodium  100 mg Oral BID   empagliflozin  10 mg Oral Daily   insulin aspart  0-20 Units Subcutaneous TID WC   insulin aspart  0-5 Units Subcutaneous QHS   insulin aspart  6 Units Subcutaneous TID WC   insulin detemir  50 Units Subcutaneous BID   losartan  25 mg Oral QHS   mouth rinse  15 mL Mouth Rinse BID   pantoprazole  40 mg Oral Daily   sertraline  25 mg Oral Daily   sodium chloride flush  10-40 mL Intracatheter Q12H   sodium chloride flush  3 mL Intravenous Q12H   spironolactone  25 mg Oral Daily   torsemide  40 mg Oral Q M,W,F   traZODone   100 mg Oral QHS    Infusions:  sodium chloride Stopped (04/30/21 2254)    PRN Medications: sodium chloride, acetaminophen, nitroGLYCERIN, ondansetron (ZOFRAN) IV, sodium chloride flush, sodium chloride flush, sorbitol, traMADol  Assessment/Plan    1. 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  Echo in 9/21 with EF 20-25%.  Echo this admission with EF < 20%, severe LV dilation, moderate RV dysfunction.   - s/p S-ICD - 7/27: intubated with pulmonary edema and required addition of NE.  - Dobutamine weaned off on 8/3 - Appears compensated. CVP 3-4 overnight, unable to get accurate measurement this am d/t air bubble in line. Creatinine 1.58 > 1.96.  BUN trending up. Weight down 9 lb overnight, confirmed weights with RN. On 40 mg Torsemide M, W, F. Diuresed well yesterday. Will hold Torsemide dose tomorrow pending labs. - Bidil stopped 8/12 due to hypotension - Continue losartan 25 qhs  (switched from Entresto to losartan on 8/16 due to low BPs) - Continue spiro 25 mg daily - Continue jardiance 10 mg daily - No BB for now d/t recent shock - VAD work-up started.   2 Atrial fibrillation: Paroxysmal - Suspect AF with RVR drove initial CHF exacerbation. Emergent TEE-guided DCCV 7/27 due to inability to control HR - Recurrent AF on 8/5. s/p DC-CV on 05/10/21. Remains in nSR  - Seen by EP. Not a candidate for ablation due to body habitus  (could consider AVN ablation and CRT, if needed)  - Amiodarone initially stopped due to concern for toxicity, but resumed after reversion to rapid afib on 8/5. IV amio off, now on po 200 mg BID - Continue Eliquis.  - Keep K> 4 Mg > 2. Supp K - labs pending this am   3. Acute hypoxemic respiratory failure - Resp culture + for rare S. Aureus (MSSA)  - Treated with linezolid and cefepime. Cefepime switched to Ancef and Linezolid discontinued on 8/3. Ancef stopped 8/7 - Initial concern for amiodarone  toxicity -- amio stopped and steroids started, but steroids stopped the next morning on 8/4 -- per CCM team, more likely atelectasis driving hypoxia.  Deferred management to them. CT chest not c/w amio toxicity despite ESR 128 - Extubated on 8/4, initially to BiPAP and now stable on RA   4. AKI on CKD stage 3  - Has solitary kidney.   - Suspect cardiorenal syndrome/low output with AF/RVR in setting of cardiomyopathy.   - Baseline creatinine is 1.5-1.7, Creatinine peaked at 4.65. 1.60 -> 1.77 -> 1.96. Torsemide decreased to MWF. Diuresed well yesterday and weight down 9  lb. Hold tomorrow's dose of Torsemide pending labs. - Urinary obstruction likely played a role. Urology placed foley catheter with improvement in UOP. No residual retention. - Avoid hypotension. Off bidil. BP better after switching Entresto to Losartan.  5. OSA - CPAP   6. Hypokalemia - K 3.4. Replace. - Continue close monitoring.  7. Acute gout - Received prednisone and colchicine -> resolved - Continue allopurinol  8. DM2. Poorly controlled - Jardiance - continue SSI - Hgb A1c 9.4 - DM consulted, appreciate input  9. Debility - HF much improved. Co-ox stable off inotropes. Remains in NSR. Main issue is severe debility.  - Remains quite weak and unable to ambulate independently. He is quite motivated to improve and agrees to CIR. It will expedite our chances of getting him ready for VAD in a reasonable amount of time.    Discharge planning: - Continue aggressive PT/OT/CR - CIR initially denied by insurance. Approved after Peer to Peer yesterday. Now awaiting bed. - Consult IV team to remove L IJ CVC   FINCH, LINDSAY N, PA-C  05/22/2021, 7:03 AM  Advanced Heart Failure Team Pager [phone] (M-F; 7a - 5p)  Please contact [facility] for night-coverage after hours (5p -7a ) and weekends on [email or web address]  Patient seen and examined with the above-signed Advanced Practice Provider and/or Housestaff. I personally  reviewed laboratory data, imaging studies and relevant notes. I independently examined the patient and formulated the important aspects of the plan. I have edited the note to reflect any of my changes or salient points. I have personally discussed the plan with the patient and/or family.  Stable from HF perspective. SCr slightly worse. Remains weak.   General:  Sitting up in bed  No resp difficulty HEENT: normal Neck: supple. no JVD. Carotids 2+ bilat; no bruits. No lymphadenopathy or thryomegaly appreciated. Cor: PMI nondisplaced. Regular rate & rhythm. No rubs, gallops or murmurs. Lungs: clear Abdomen: obese soft, nontender, nondistended. No hepatosplenomegaly. No bruits or masses. Good bowel sounds. Extremities: no cyanosis, clubbing, rash, edema Neuro: alert & orientedx3, cranial nerves grossly intact. moves all 4 extremities w/o difficulty. Affect pleasant  Ok for d/c to CIR. Hold torsemide completely. Avoid hypotension. Watch ScR closely. HF team will follow while in CIR.   Glori Bickers, MD  8:51 AM

## 2021-05-22 NOTE — Progress Notes (Signed)
Inpatient Rehabilitation Medication Review by a Pharmacist  A complete drug regimen review was completed for this patient to identify any potential clinically significant medication issues.  High Risk Drug Classes Is patient taking? Indication by Medication  Antipsychotic No   Anticoagulant Yes Apixaban for afib  Antibiotic No   Opioid Yes Tramadol for pain  Antiplatelet No   Hypoglycemics/insulin Yes Levemir, novolog, and Jardiance for glucose control  Vasoactive Medication No   Chemotherapy No   Other No      Type of Medication Issue Identified Description of Issue Recommendation(s)  Drug Interaction(s) (clinically significant)  N/A   Duplicate Therapy  N/A   Allergy  N/A   No Medication Administration End Date  N/A   Incorrect Dose  N/A   Additional Drug Therapy Needed  N/A   Significant med changes from prior encounter (inform family/care partners about these prior to discharge). N/A   Other       Clinically significant medication issues were identified that warrant physician communication and completion of prescribed/recommended actions by midnight of the next day:  No  Name of provider notified for urgent issues identified: N/A  Provider Method of Notification:     Pharmacist comments:   Time spent performing this drug regimen review (minutes):  15 mins   Ramond Craver 05/22/2021 6:22 PM

## 2021-05-22 NOTE — Evaluation (Signed)
Occupational Therapy Assessment and Plan  Patient Details  Name: Jeffrey [name] MRN: [id number] Date of Birth: [birth year]  OT Diagnosis: abnormal posture, acute pain, and muscle weakness (generalized) Rehab Potential: Rehab Potential (ACUTE ONLY): Excellent ELOS: 5-7 days   Today's Date: 05/23/2021 OT Individual Time: 8811-0315 OT Individual Time Calculation (min): 70 min     Hospital Problem: Active Problems:   Cardiogenic shock [facility])   Past Medical History:  Past Medical History:  Diagnosis Date   Anxiety    Aspirin allergy    Childhood asthma    Chronic systolic CHF (congestive heart failure) ([place])    a. EF 20-25% in 2012. b. EF 45-50% in 10/2011 with nonischemic nuc - presumed NICM. c. 12/2014 Echo: Sev depressed LV fxn, sev dil LV, mild LVH, mild MR, sev dil LA, mildly reduced RV fxn.   CKD (chronic kidney disease) stage 2, GFR 60-89 ml/min    H/O vasectomy 12/2019   High cholesterol    Hypertension    Morbid obesity ([place])    Nephrolithiasis    OSA on CPAP    Paroxysmal atrial fibrillation ([place])    Presumed NICM    a. 04/2014 Myoview: EF 26%, glob HK, sev glob HK, ? prior infarct;  b. Never cathed 2/2 CKD.   Renal cell carcinoma ([place])    a. s/p Rt robotic assisted partial converted to radical nephrectomy on 01/2013.   Troponin level elevated    a. 04/2014, 12/2014: felt due to CHF.   Type II diabetes mellitus ([place])    Ventricular tachycardia ([place])    a. appropriate ICD therapy 12/2017   Past Surgical History:  Past Surgical History:  Procedure Laterality Date   APPENDECTOMY  07/2004   BIOPSY  02/10/2021   Procedure: BIOPSY;  Surgeon: Gatha Mayer, MD;  Location: [facility] ENDOSCOPY;  Service: Endoscopy;;   CARDIAC CATHETERIZATION N/A 05/17/2015   Procedure: Right/Left Heart Cath and Coronary Angiography;  Surgeon: Jolaine Artist, MD;  Location: [place] CV LAB;  Service: Cardiovascular;  Laterality: N/A;   CARDIOVERSION N/A 04/30/2021   Procedure: CARDIOVERSION;   Surgeon: Larey Dresser, MD;  Location: [facility] ENDOSCOPY;  Service: Cardiovascular;  Laterality: N/A;   COLONOSCOPY WITH PROPOFOL N/A 02/10/2021   Procedure: COLONOSCOPY WITH PROPOFOL;  Surgeon: Gatha Mayer, MD;  Location: [facility] ENDOSCOPY;  Service: Endoscopy;  Laterality: N/A;   EP IMPLANTABLE DEVICE N/A 05/17/2015   Procedure: SubQ ICD Implant;  Surgeon: Will Meredith Leeds, MD;  Location: [place] CV LAB;  Service: Cardiovascular;  Laterality: N/A;   ESOPHAGOGASTRODUODENOSCOPY (EGD) WITH PROPOFOL N/A 02/10/2021   Procedure: ESOPHAGOGASTRODUODENOSCOPY (EGD) WITH PROPOFOL;  Surgeon: Gatha Mayer, MD;  [facility] ENDOS[facility];  Service: Endoscopy;  Laterality: N/A;   RIGHT HEART CATH N/A 05/13/2021   Procedure: RIGHT HEART CATH;  Surgeon: Jolaine Artist, MD;  Location: [place] CV LAB;  Service: Cardiovascular;  Laterality: N/A;   ROBOTIC ASSISTED LAPAROSCOPIC LYSIS OF ADHESION  01/13/2013   Procedure: ROBOTIC ASSISTED LAPAROSCOPIC LYSIS OF ADHESION EXTENSIVE;  Surgeon: Alexis Frock, MD;  Location: [facility] ORS;  Service: Urology;;   ROBOTIC ASSITED PARTIAL NEPHRECTOMY Right 01/13/2013   Procedure: ROBOTIC ASSITED PARTIAL NEPHRECTOMY CONVERTED TO ROBOTIC ASSISTED RIGHT RADICAL NEPHRECTOMY;  Surgeon: Alexis Frock, MD;  Location: [facility] ORS;  Service: Urology;  Laterality: Right;   SUBQ ICD CHANGEOUT N/A 05/16/2020   Procedure: SUBQ ICD CHANGEOUT;  Surgeon: [name], MD;  Location: [place] CV LAB;  Service: Cardiovascular;  Laterality: N/A;   SUBQ  ICD CHANGEOUT N/A 05/15/2020   Procedure: SUBQ ICD CHANGEOUT;  Surgeon: [name], MD;  Location: [place] CV LAB;  Service: Cardiovascular;  Laterality: N/A;   TEE WITHOUT CARDIOVERSION N/A 04/30/2021   Procedure: TRANSESOPHAGEAL ECHOCARDIOGRAM (TEE);  Surgeon: Larey Dresser, MD;  Location: [facility] ENDOSCOPY;  Service: Cardiovascular;  Laterality: N/A;   VASECTOMY      Assessment & Plan Clinical Impression: Jeffrey [name] is a 45 year old  right-handed male with history of anxiety, chronic diastolic congestive heart failure, childhood asthma chronic systolic CHF CKD stage II, morbid obesity with BMI 47, hyperlipidemia, hypertension, PAF, nonischemic cardiomyopathy status post ICD, renal cell carcinoma status post nephrectomy, type 2 diabetes mellitus.  Per chart review lives with spouse.  Independent but sedentary prior to admission.  1 level home 5 steps to entry.  Presented 04/29/2021 with intermittent nausea as well as shortness of breath and lower extremity edema.  In the ER he was found to be in A. fib with RVR in the 160s with frequent runs of NSVT.  He denied any chest pain.  Sodium 137 potassium 3.6 creatinine 1.99 from baseline 1.49 in April 2022, troponin 75-64, WBC 9.6.  Chest x-ray showed mild CHF.  Cardiology services consulted placed on IV diuresis.  Echocardiogram with ejection fraction of less than 64% grade 3 diastolic dysfunction.  He did undergo emergent TEE guided DCCV 7/27 due to inability to control heart rate.  Not a candidate for ablation due to body habitus.  He had been placed on amiodarone stopped due to concern for toxicity and adjusted accordingly.  He did remain on Eliquis as directed.  Intermittent bouts of orthostasis adjustments made per cardiology services.  Hospital course complicated by acute hypoxemic respiratory failure as noted respiratory cultures positive for rare S-aureus treated with linezolid and cefepime.  AKI on CKD creatinine peaked 4.65 felt to be possibly also related to diuresis and his torsemide was adjusted.  He did have some urinary obstruction likely playing a role in AKI urology consulted placed a Foley catheter tube that is since been removed.  Patient is currently awaiting plan for LVAD as stabilizes from a cardiac standpoint.  Patient did have acute gout flareup responding well to prednisone and colchicine which is since resolved and continues on allopurinol.  Therapy evaluations completed and  patient was admitted for a comprehensive rehab program due to decreased functional mobility. Complains of weakness and fatigue.   Patient currently requires min with basic self-care skills secondary to muscle weakness and decreased standing balance and decreased postural control.  Prior to hospitalization, patient could complete BADLs with independent .  Patient will benefit from skilled intervention to increase independence with basic self-care skills prior to discharge  home with family support .  Anticipate patient will require intermittent supervision and follow up home health.  OT - End of Session Endurance Deficit: No OT Assessment Rehab Potential (ACUTE ONLY): Excellent OT Barriers to Discharge: Weight OT Patient demonstrates impairments in the following area(s): Balance;Motor;Pain;Safety;Skin Integrity OT Basic ADL's Functional Problem(s): Bathing;Grooming;Dressing;Toileting OT Advanced ADL's Functional Problem(s): Simple Meal Preparation OT Transfers Functional Problem(s): Toilet;Tub/Shower OT Additional Impairment(s): None OT Plan OT Intensity: Minimum of 1-2 x/day, 45 to 90 minutes OT Frequency: 5 out of 7 days OT Duration/Estimated Length of Stay: 5-7 days OT Treatment/Interventions: Balance/vestibular training;DME/adaptive equipment instruction;Patient/family education;Therapeutic Activities;Cognitive remediation/compensation;Psychosocial support;Therapeutic Exercise;UE/LE Strength taining/ROM;Self Care/advanced ADL retraining;Functional mobility training;Community reintegration;Discharge planning;Skin care/wound managment;UE/LE Coordination activities;Pain management;Disease mangement/prevention OT Self Feeding Anticipated Outcome(s): No goal OT Basic Self-Care Anticipated  Outcome(s): Mod I OT Toileting Anticipated Outcome(s): Mod I OT Bathroom Transfers Anticipated Outcome(s): Mod I OT Recommendation Recommendations for Other Services: Therapeutic Recreation  consult Therapeutic Recreation Interventions: Kitchen group Follow Up Recommendations: Home health OT Equipment Recommended: To be determined;Tub/shower seat   OT Evaluation Precautions/Restrictions  Precautions Precautions: Fall Precaution Comments: B knee pain, premorbid lightheadedness (pt thinks related to meds)  Home Living/Prior [place] expects to be discharged to:: Private residence Living Arrangements: Spouse/significant other, Children Available Help at Discharge: Available PRN/intermittently Type of Home: House Home Access: Stairs to enter, Ramped entrance Technical brewer of Steps: 5; ramp in back Entrance Stairs-Rails: Right, Left, Can reach both Home Layout: One level Bathroom Shower/Tub: Multimedia programmer: Programmer, systems: Yes  Lives With: Family, Spouse (children 69 y/o and 39 y/o) IADL History Homemaking Responsibilities: Yes (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[place]: [place], taking care of children Prior Function Level of Independence: Independent with gait, Independent with homemaking with ambulation, Independent with basic ADLs  Able to Take Stairs?: Yes Driving: No Vocation: On disability Leisure: Hobbies-yes (Comment) Comments: plays with kids, reading to kids Vision Baseline Vision/History:  (does not wear glasses at baseline, but per pt "I need to") Patient Visual Report: No change from baseline (impairment with distance, occasional floaters) Vision Assessment?: No apparent visual deficits Perception  Perception: Within Functional Limits Praxis Praxis: Intact Cognition Overall Cognitive Status: Within Functional Limits for tasks  assessed Arousal/Alertness: Awake/alert Orientation Level: Person;Place;Situation Person: Oriented Place: Oriented Situation: Oriented Year: 2022 Month: August Day of Week: Correct Memory: Appears intact Immediate Memory Recall: Sock;Blue;Bed Memory Recall Sock: Without Cue Memory Recall Blue: Without Cue Memory Recall Bed: Without Cue Awareness: Appears intact Problem Solving: Appears intact Safety/Judgment: Appears intact Sensation Sensation Light Touch: Appears Intact Coordination Gross Motor Movements are Fluid and Coordinated: No Fine Motor Movements are Fluid and Coordinated: No Coordination and Movement Description: Functional gross motor limited by body habitus, mildly tremulous UEs Finger Nose Finger Test: Mildly tremulous, per pt this is baseline Motor  Motor Motor: Abnormal postural alignment and control Motor - Skilled Clinical Observations: Mildly affected by body habitus, note that pt compensates well functionally  Trunk/Postural Assessment  Cervical Assessment Cervical Assessment: Exceptions to [facility] (forward head) Thoracic Assessment Thoracic Assessment: Exceptions to [facility] (rounded shoulders) Lumbar Assessment Lumbar Assessment: Exceptions to [facility] (posterior pelvic tilt) Postural Control Postural Control: Deficits on evaluation (Pt relying on device for balance support during functional transfers and sit<stands)  Balance Balance Balance Assessed: Yes Dynamic Sitting Balance Dynamic Sitting - Balance Support: During functional activity;No upper extremity supported Dynamic Sitting - Level of Assistance: 5: Stand by assistance (donning gripper socks EOB) Dynamic Standing Balance Dynamic Standing - Balance Support: During functional activity;No upper extremity supported Dynamic Standing - Level of Assistance: 4: Min assist Dynamic Standing - Balance Activities: Lateral lean/weight shifting;Forward lean/weight shifting (doffing shirt while standing beside  shower) Extremity/Trunk Assessment RUE Assessment RUE Assessment: Within Functional Limits Active Range of Motion (AROM) Comments: WNL General Strength Comments: 4+/5 distally, 4/5 proximally LUE Assessment LUE Assessment: Within Functional Limits Active Range of Motion (AROM) Comments: WNL General Strength Comments: 4+/5 distally, 4/5 proximally  Care Tool Care Tool Self Care Eating    Not assessed  Oral Care    Oral Care Assist Level: Supervision/Verbal cueing (standing at the sink)    Bathing   Body parts bathed by patient: Right arm;Left arm;Chest;Front perineal area;Abdomen;Buttocks;Right upper leg;Left upper leg;Right lower leg;Left lower leg;Face     Assist Level: Supervision/Verbal cueing    Upper Body Dressing(including orthotics)   What is the patient wearing?: Pull over shirt   Assist Level: Contact Guard/Touching assist (standing)    Lower Body Dressing (excluding footwear)   What is the patient wearing?: Pants Assist for lower body dressing: Supervision/Verbal cueing    Putting on/Taking off footwear   What is the patient wearing?: Non-skid slipper socks;Ted hose Assist for footwear: Moderate Assistance - Patient 50 - 74%       Care Tool Toileting Toileting activity   Assist for toileting: Supervision/Verbal cueing      Toilet transfer   Assist Level: Contact Guard/Touching assist       Refer to Care Plan for Long Term Goals  SHORT TERM GOAL WEEK 1 OT Short Term Goal 1 (Week 1): STGs=LTGs due to ELOS  Recommendations for other services: Surveyor, mining group   Skilled Therapeutic Intervention Skilled OT session completed with focus on initial evaluation, education on OT role/POC, and establishment of patient-centered goals.   Pt greeted in the bed, c/o B knee pain (Rt>Lt) however pt wishing to ask RN for pain medicine at end of session. He was very motivated to shower today. Ambulated using RW with CGA-supervision to the  toilet first. Pt with +bladder void while seated. He then doffed clothes while standing beside the shower, CGA for navigating over threshold before sitting down on the padded tub shower bench. He bathed at sit<stand level, able to reach feet to wash but would like to reach them better. Interested in using [place] sponge so OT provided one for him at end of session. Dressing was completed sit<stand from TTB, close supervision-CGA for standing balance. CGA for transfer out of the shower and pt returned to EOB. OT donned his Ted hose and pt able to don both gripper socks while elevating each foot up on the bed. Per pt, this is his routine at home. Oral care/grooming tasks completed while standing at the sink with supervision for balance. Discussed with pt ELOS and goals with pt in agreement of POC. OT also taught him plastic bag method for increasing his functional independence with donning Ted hose. He returned to bed at close of session, all needs within reach and bed alarm set.   ADL ADL Eating: Not assessed Grooming: Supervision/safety Where Assessed-Grooming: Standing at sink Upper Body Bathing: Setup Where Assessed-Upper Body Bathing: Shower Lower Body Bathing: Supervision/safety Where Assessed-Lower Body Bathing: Shower Upper Body Dressing: Contact guard Where Assessed-Upper Body Dressing: Other (Comment) (standing) Lower Body Dressing: Minimal assistance Where Assessed-Lower Body Dressing: Edge of bed;Other (Comment) (sit<stand from TTB) Toileting: Supervision/safety Where Assessed-Toileting: Glass blower/designer: Therapist, music Method: Ambulating (RW) Science writer: Energy manager: Curator Method: Ambulating (RW) Youth worker: Network engineer bench;Grab bars Mobility   Supervision-CGA dynamic standing balance with RW   Discharge Criteria: Patient will be discharged from OT if patient refuses treatment 3  consecutive times without medical reason, if treatment goals not met, if there is a change in medical status, if patient makes no progress towards goals or if patient is discharged from hospital.  The above assessment, treatment plan, treatment alternatives and goals were discussed and mutually  agreed upon: by patient  Skeet Simmer 05/23/2021, 12:30 PM

## 2021-05-22 NOTE — Plan of Care (Signed)
  Problem: Clinical Measurements: Goal: Ability to maintain clinical measurements within normal limits will improve Outcome: Not Progressing Goal: Diagnostic test results will improve Outcome: Not Progressing   Problem: Elimination: Goal: Will not experience complications related to bowel motility Outcome: Not Progressing

## 2021-05-22 NOTE — Progress Notes (Signed)
Inpatient Rehab Admissions Coordinator:    I have insurance approval and a bed available for pt to admit to CIR today. Dr. [name] in agreement.  Will let pt/family and TOC team know.   Shann Medal, PT, DPT Admissions Coordinator [phone] 05/22/21  9:57 AM

## 2021-05-22 NOTE — Progress Notes (Signed)
Patient has home CPAP at bedside within reach.  RT assistance not needed at this time.

## 2021-05-22 NOTE — H&P (Signed)
Physical Medicine and Rehabilitation Admission H&P  CC: Cardiopulmonary debility  HPI: Jeffrey [name] is a 45 year old right-handed male with history of anxiety, chronic diastolic congestive heart failure, childhood asthma chronic systolic CHF CKD stage II, morbid obesity with BMI 47, hyperlipidemia, hypertension, PAF, nonischemic cardiomyopathy status post ICD, renal cell carcinoma status post nephrectomy, type 2 diabetes mellitus.  Per chart review lives with spouse.  Independent but sedentary prior to admission.  1 level home 5 steps to entry.  Presented 04/29/2021 with intermittent nausea as well as shortness of breath and lower extremity edema.  In the ER he was found to be in A. fib with RVR in the 160s with frequent runs of NSVT.  He denied any chest pain.  Sodium 137 potassium 3.6 creatinine 1.99 from baseline 1.49 in April 2022, troponin 75-64, WBC 9.6.  Chest x-ray showed mild CHF.  Cardiology services consulted placed on IV diuresis.  Echocardiogram with ejection fraction of less than [id number] grade 3 diastolic dysfunction.  He did undergo emergent TEE guided DCCV 7/27 due to inability to control heart rate.  Not a candidate for ablation due to body habitus.  He had been placed on amiodarone stopped due to concern for toxicity and adjusted accordingly.  He did remain on Eliquis as directed.  Intermittent bouts of orthostasis adjustments made per cardiology services.  Hospital course complicated by acute hypoxemic respiratory failure as noted respiratory cultures positive for rare S-aureus treated with linezolid and cefepime.  AKI on CKD creatinine peaked 4.65 felt to be possibly also related to diuresis and his torsemide was adjusted.  He did have some urinary obstruction likely playing a role in AKI urology consulted placed a Foley catheter tube that is since been removed.  Patient is currently awaiting plan for LVAD as stabilizes from a cardiac standpoint.  Patient did have acute gout flareup  responding well to prednisone and colchicine which is since resolved and continues on allopurinol.  Therapy evaluations completed and patient was admitted for a comprehensive rehab program due to decreased functional mobility. Complains of weakness and fatigue.  . Review of Systems  Constitutional:  Positive for malaise/fatigue. Negative for chills and fever.  HENT:  Negative for hearing loss.   Eyes:  Negative for blurred vision and double vision.  Respiratory:  Positive for shortness of breath. Negative for cough.   Cardiovascular:  Positive for palpitations and leg swelling. Negative for chest pain.  Gastrointestinal:  Positive for constipation and nausea. Negative for heartburn and vomiting.  Genitourinary:  Positive for urgency. Negative for dysuria, flank pain and hematuria.  Musculoskeletal:  Positive for joint pain and myalgias.  Skin:  Negative for rash.  Neurological:  Positive for weakness.  Psychiatric/Behavioral:         Anxiety  All other systems reviewed and are negative. Past Medical History:  Diagnosis Date   Anxiety    Aspirin allergy    Childhood asthma    Chronic systolic CHF (congestive heart failure) ([name])    a. EF 20-25% in 2012. b. EF 45-50% in 10/2011 with nonischemic nuc - presumed NICM. c. 12/2014 Echo: Sev depressed LV fxn, sev dil LV, mild LVH, mild MR, sev dil LA, mildly reduced RV fxn.   CKD (chronic kidney disease) stage 2, GFR 60-89 ml/min    H/O vasectomy 12/2019   High cholesterol    Hypertension    Morbid obesity ([place])    Nephrolithiasis    OSA on CPAP    Paroxysmal atrial fibrillation ([place])  Presumed NICM    a. 04/2014 Myoview: EF 26%, glob HK, sev glob HK, ? prior infarct;  b. Never cathed 2/2 CKD.   Renal cell carcinoma ([place])    a. s/p Rt robotic assisted partial converted to radical nephrectomy on 01/2013.   Troponin level elevated    a. 04/2014, 12/2014: felt due to CHF.   Type II diabetes mellitus ([place])    Ventricular tachycardia ([place])     a. appropriate ICD therapy 12/2017   Past Surgical History:  Procedure Laterality Date   APPENDECTOMY  07/2004   BIOPSY  02/10/2021   Procedure: BIOPSY;  Surgeon: Gatha Mayer, MD;  Location: [facility];  Service: Endoscopy;;   CARDIAC CATHETERIZATION N/A 05/17/2015   Procedure: Right/Left Heart Cath and Coronary Angiography;  Surgeon: Jolaine Artist, MD;  Location: [facility];  Service: Cardiovascular;  Laterality: N/A;   CARDIOVERSION N/A 04/30/2021   Procedure: CARDIOVERSION;  Surgeon: Larey Dresser, MD;  Location: [facility];  Service: Cardiovascular;  Laterality: N/A;   COLONOSCOPY WITH PROPOFOL N/A 02/10/2021   Procedure: COLONOSCOPY WITH PROPOFOL;  Surgeon: Gatha Mayer, MD;  Location: [facility];  Service: Endoscopy;  Laterality: N/A;   EP IMPLANTABLE DEVICE N/A 05/17/2015   Procedure: SubQ ICD Implant;  Surgeon: Will Meredith Leeds, MD;  Location: [facility];  Service: Cardiovascular;  Laterality: N/A;   ESOPHAGOGASTRODUODENOSCOPY (EGD) WITH PROPOFOL N/A 02/10/2021   Procedure: ESOPHAGOGASTRODUODENOSCOPY (EGD) WITH PROPOFOL;  Surgeon: Gatha Mayer, MD;  Location: [facility];  Service: Endoscopy;  Laterality: N/A;   RIGHT HEART CATH N/A 05/13/2021   Procedure: RIGHT HEART CATH;  Surgeon: Jolaine Artist, MD;  Location: [facility];  Service: Cardiovascular;  Laterality: N/A;   ROBOTIC ASSISTED LAPAROSCOPIC LYSIS OF ADHESION  01/13/2013   Procedure: ROBOTIC ASSISTED LAPAROSCOPIC LYSIS OF ADHESION EXTENSIVE;  Surgeon: Alexis Frock, MD;  Location: [facility];  Service: Urology;;   ROBOTIC ASSITED PARTIAL NEPHRECTOMY Right 01/13/2013   Procedure: ROBOTIC ASSITED PARTIAL NEPHRECTOMY CONVERTED TO ROBOTIC ASSISTED RIGHT RADICAL NEPHRECTOMY;  Surgeon: Alexis Frock, MD;  Location: [facility];  Service: Urology;  Laterality: Right;   SUBQ ICD CHANGEOUT N/A 05/16/2020   Procedure: SUBQ ICD CHANGEOUT;  Surgeon: [name], MD;  Location: [facility];   Service: Cardiovascular;  Laterality: N/A;   SUBQ ICD CHANGEOUT N/A 05/15/2020   Procedure: SUBQ ICD CHANGEOUT;  Surgeon: [name], MD;  Location: [facility];  Service: Cardiovascular;  Laterality: N/A;   TEE WITHOUT CARDIOVERSION N/A 04/30/2021   Procedure: TRANSESOPHAGEAL ECHOCARDIOGRAM (TEE);  Surgeon: Larey Dresser, MD;  Location: [facility] ENDOSCOPY;  Service: Cardiovascular;  Laterality: N/A;   VASECTOMY     Family History  Problem Relation Age of Onset   Hypertension Mother    Diabetes Maternal Aunt    Heart attack Neg Hx    Stroke Neg Hx    Colon cancer Neg Hx    Pancreatic cancer Neg Hx    Stomach cancer Neg Hx    Liver cancer Neg Hx    Esophageal cancer Neg Hx    Social History:  reports that he has been smoking cigarettes. He has a 5.50 pack-year smoking history. He has never used smokeless tobacco. He reports current alcohol use. He reports current drug use. Frequency: 14.00 times per week. Drug: Marijuana. Allergies:  Allergies  Allergen Reactions   Aspirin Shortness Of Breath, Itching and Rash     Burning sensation (Patient reports he tolerates  other NSAIDS)    Bee Venom Hives and Swelling   Lisinopril Cough   Tomato Rash   Medications Prior to Admission  Medication Sig Dispense Refill   acetaminophen (TYLENOL) 325 MG tablet Take 2 tablets (650 mg total) by mouth every 6 (six) hours as needed for fever.     allopurinol (ZYLOPRIM) 100 MG tablet Take 1 tablet (100 mg total) by mouth daily. 90 tablet 3   ALPRAZolam (XANAX) 0.5 MG tablet Take 1 tablet (0.5 mg total) by mouth 2 (two) times daily.  0   amiodarone (PACERONE) 200 MG tablet Take 1 tablet (200 mg total) by mouth 2 (two) times daily.     apixaban (ELIQUIS) 5 MG TABS tablet TAKE 1 TABLET (5 MG TOTAL) BY MOUTH 2 (TWO) TIMES DAILY. RESUME WITH EVENING DOSE ON SUNDAY (Patient taking differently: Take 5 mg by mouth 2 (two) times daily.) 60 tablet 10   docusate sodium (COLACE) 100 MG capsule Take 1 capsule  (100 mg total) by mouth 2 (two) times daily. 10 capsule 0   empagliflozin (JARDIANCE) 10 MG TABS tablet TAKE 1 TABLET (10 MG TOTAL) BY MOUTH DAILY BEFORE BREAKFAST. (Patient taking differently: Take 10 mg by mouth daily before breakfast.) 90 tablet 3   insulin aspart (NOVOLOG) 100 UNIT/ML injection Inject 6 Units into the skin 3 (three) times daily with meals. 10 mL 11   insulin aspart (NOVOLOG) 100 UNIT/ML injection Inject 0-20 Units into the skin 3 (three) times daily with meals. 10 mL 11   insulin aspart (NOVOLOG) 100 UNIT/ML injection Inject 0-5 Units into the skin at bedtime. 10 mL 11   insulin detemir (LEVEMIR) 100 UNIT/ML injection Inject 0.5 mLs (50 Units total) into the skin 2 (two) times daily. 10 mL 11   losartan (COZAAR) 25 MG tablet Take 1 tablet (25 mg total) by mouth at bedtime.     nitroGLYCERIN (NITROSTAT) 0.4 MG SL tablet Place 1 tablet (0.4 mg total) under the tongue every 5 (five) minutes x 3 doses as needed for chest pain.  12   ondansetron (ZOFRAN) 4 MG/2ML SOLN injection Inject 2 mLs (4 mg total) into the vein every 6 (six) hours as needed for nausea. 2 mL 0   [START ON 05/23/2021] pantoprazole (PROTONIX) 40 MG tablet Take 1 tablet (40 mg total) by mouth daily.     [START ON 05/23/2021] sertraline (ZOLOFT) 25 MG tablet Take 1 tablet (25 mg total) by mouth daily.     spironolactone (ALDACTONE) 25 MG tablet Take 1 tablet (25 mg total) by mouth daily. 30 tablet 6   traMADol (ULTRAM) 50 MG tablet Take 2 tablets (100 mg total) by mouth every 12 (twelve) hours as needed for moderate pain. 30 tablet    traZODone (DESYREL) 100 MG tablet Take 1 tablet (100 mg total) by mouth at bedtime.      Drug Regimen Review Drug regimen was reviewed and remains appropriate with no significant issues identified  Home: Home Living Family/patient expects to be discharged to:: Private residence Living Arrangements: Spouse/significant other, Children Available Help at Discharge: Family, Available 24  hours/day Type of Home: House Home Access: Stairs to enter, Ramped entrance Technical brewer of Steps: 5; ramp in back Entrance Stairs-Rails: Right Home Layout: One level Bathroom Shower/Tub: Multimedia programmer: Standard Home Equipment: None   Functional History: Prior Function Level of Independence: Independent Comments: stands to shower, wears slip on shoes, struggles with socks and washing feet   Functional Status:  Mobility: Bed Mobility Overal  bed mobility: Needs Assistance Bed Mobility: Supine to Sit Rolling: Min assist Sidelying to sit: Min assist, +2 for physical assistance Supine to sit: Modified independent (Device/Increase time) Sit to supine: Supervision General bed mobility comments: increased time/effort Transfers Overall transfer level: Needs assistance Equipment used: 4-wheeled walker Transfer via Lift Equipment: Stedy Transfers: Sit to/from Stand Sit to Stand: Supervision, Min guard General transfer comment: from low bed to rollator with Supervision but min guard for stand>sit due to fatigue/decreased safety technique. Ambulation/Gait Ambulation/Gait assistance: Min guard, +2 safety/equipment Gait Distance (Feet): 140 Feet (42f x2 with standing break between (1085ftotal), seated break, then 7517f2 with standing break between (140f72ftal)) Assistive device: 4-wheeled walker Gait Pattern/deviations: Step-through pattern, Decreased stride length, Wide base of support General Gait Details: Pt with low steps, somewhat stiff-knee gait pattern due to B knee pain but improved knee flexion as distance progressed, no buckling this date but remains quick to fatigue and increased work of breathing with exertion. Chair follow for safety and he needed 1 seated break ~3 minutes and 2 standing breaks ~30 sec each trial. SpO2 92% on RA with exertion and 99% on RA at rest. Needs cues for use of brakes with rollator and somewhat unsafe with stand>sit due to  fatigue needing min guard for safety. Gait velocity: Reduced Gait velocity interpretation: 1.31 - 2.62 ft/sec, indicative of limited community ambulator   ADL: ADL Overall ADL's : Needs assistance/impaired Eating/Feeding: Independent, Bed level Grooming: Modified independent, Sitting, Wash/dry hands Upper Body Bathing: Minimal assistance, Sitting Lower Body Bathing: Maximal assistance, Sit to/from stand Upper Body Dressing : Set up, Sitting Lower Body Dressing: Minimal assistance, Sit to/from stand, With adaptive equipment Lower Body Dressing Details (indicate cue type and reason): pt able to bend to feet to manage sock but noted increased difficulty breathing and safety concerns with leaning forward. Educated on use of Reacher and sock aide with pt able to return demo with Min A. Toilet Transfer: Min guard, Ambulation, RW, Regular Toilet, GrabPersonal assistantails (indicate cue type and reason): increased effort for power up and lowering down to regular toilet, use of grab bars Toileting- Clothing Manipulation and Hygiene: Supervision/safety, Sit to/from stand, Sitting/lateral lean Toileting - Clothing Manipulation Details (indicate cue type and reason): able to perform hygiene, clothing mgmt with increased time Functional mobility during ADLs: Min guard, Rolling walker General ADL Comments: Pt progressing well towards OT goals, limited by chronic knee pain. Discussed ADL routine and home setup with pt reporting need for shower chair (educated on pain mgmt and energy conservation benefits) and RW for stability/pain mgmt with mobility   Cognition: Cognition Overall Cognitive Status: Within Functional Limits for tasks assessed Orientation Level: Oriented X4 Cognition Arousal/Alertness: Awake/alert Behavior During Therapy: WFL for tasks assessed/performed Overall Cognitive Status: Within Functional Limits for tasks assessed General Comments: pleasant, cooperative      Physical  Exam: Blood pressure (!) 107/95, pulse (!) 101, temperature 98.5 F (36.9 C), resp. rate 19, height '5\' 8"'$  (1.727 m), weight (!) 140 kg, SpO2 99 %. Gen: no distress, normal appearing, BMI 45.69 HEENT: oral mucosa pink and moist, NCAT Cardio: Reg rate Chest: normal effort, tachypneic Abd: soft, non-distended Ext: no edema Psych: pleasant, normal affect Skin: intact Neurological:     Comments: Patient is alert.  Appears a bit fatigued.  Oriented x3 and follows commands.   Results for orders placed or performed during the hospital encounter of 04/29/21 (from the past 48 hour(s))  Glucose, capillary  Status: Abnormal   Collection Time: 05/20/21  9:08 PM  Result Value Ref Range   Glucose-Capillary 141 (H) 70 - 99 mg/dL    Comment: Glucose reference range applies only to samples taken after fasting for at least 8 hours.   Comment 1 Notify RN    Comment 2 Document in Chart   Glucose, capillary     Status: Abnormal   Collection Time: 05/21/21  6:23 AM  Result Value Ref Range   Glucose-Capillary 150 (H) 70 - 99 mg/dL    Comment: Glucose reference range applies only to samples taken after fasting for at least 8 hours.  Basic metabolic panel     Status: Abnormal   Collection Time: 05/21/21  7:18 AM  Result Value Ref Range   Sodium 134 (L) 135 - 145 mmol/L   Potassium 4.1 3.5 - 5.1 mmol/L   Chloride 106 98 - 111 mmol/L   CO2 21 (L) 22 - 32 mmol/L   Glucose, Bld 160 (H) 70 - 99 mg/dL    Comment: Glucose reference range applies only to samples taken after fasting for at least 8 hours.   BUN 26 (H) 6 - 20 mg/dL   Creatinine, Ser 1.58 (H) 0.61 - 1.24 mg/dL   Calcium 9.6 8.9 - 10.3 mg/dL   GFR, Estimated 54 (L) >60 mL/min    Comment: (NOTE) Calculated using the CKD-EPI Creatinine Equation (2021)    Anion gap 7 5 - 15    Comment: Performed at [facility] [address], [place], [place] [address]  Glucose, capillary     Status: Abnormal   Collection Time: 05/21/21 11:25 AM   Result Value Ref Range   Glucose-Capillary 184 (H) 70 - 99 mg/dL    Comment: Glucose reference range applies only to samples taken after fasting for at least 8 hours.  Glucose, capillary     Status: Abnormal   Collection Time: 05/21/21  5:21 PM  Result Value Ref Range   Glucose-Capillary 139 (H) 70 - 99 mg/dL    Comment: Glucose reference range applies only to samples taken after fasting for at least 8 hours.  Glucose, capillary     Status: Abnormal   Collection Time: 05/21/21  8:50 PM  Result Value Ref Range   Glucose-Capillary 155 (H) 70 - 99 mg/dL    Comment: Glucose reference range applies only to samples taken after fasting for at least 8 hours.   Comment 1 Notify RN    Comment 2 Document in Chart   Basic metabolic panel     Status: Abnormal   Collection Time: 05/22/21  3:55 AM  Result Value Ref Range   Sodium 135 135 - 145 mmol/L   Potassium 3.4 (L) 3.5 - 5.1 mmol/L   Chloride 100 98 - 111 mmol/L   CO2 23 22 - 32 mmol/L   Glucose, Bld 142 (H) 70 - 99 mg/dL    Comment: Glucose reference range applies only to samples taken after fasting for at least 8 hours.   BUN 31 (H) 6 - 20 mg/dL   Creatinine, Ser 1.96 (H) 0.61 - 1.24 mg/dL   Calcium 9.6 8.9 - 10.3 mg/dL   GFR, Estimated 42 (L) >60 mL/min    Comment: (NOTE) Calculated using the CKD-EPI Creatinine Equation (2021)    Anion gap 12 5 - 15    Comment: Performed at [facility] [address] [place], [place] [address]  Glucose, capillary     Status: Abnormal  Collection Time: 05/22/21  6:21 AM  Result Value Ref Range   Glucose-Capillary 156 (H) 70 - 99 mg/dL    Comment: Glucose reference range applies only to samples taken after fasting for at least 8 hours.   Comment 1 Notify RN    Comment 2 Document in Chart   Glucose, capillary     Status: Abnormal   Collection Time: 05/22/21 11:44 AM  Result Value Ref Range   Glucose-Capillary 121 (H) 70 - 99 mg/dL    Comment: Glucose reference range applies only to  samples taken after fasting for at least 8 hours.   No results found.     Medical Problem List and Plan: 1.  Debility secondary to acute on chronic systolic congestive heart failure/cardiogenic and ultimate plan for LVAD  -patient may shower  -ELOS/Goals: 5-7 days modI  -Admit to CIR 2.  Antithrombotics: -DVT/anticoagulation: Eliquis [employer]aceutical: Other (comment)  -antiplatelet therapy: N/A 3. Pain Management: Tramadol as needed 4. Depression: Continue Zoloft 25 mg daily, 5. Neuropsych: This patient is capable of making decisions on his own behalf. 6. Skin/Wound Care: Routine skin checks 7. Fluids/Electrolytes/Nutrition: Routine in and outs with follow-up chemistries 8.  PAF.  Continue Eliquis.  Amiodarone 200 mg twice daily.  Patient underwent emergent TEE guided DCCV 7/27.  Cardiac rate controlled. 9.  Acute hypoxemic respiratory failure.  Resolved.  Antibiotic therapy completed. 10.  AKI on CKD stage III.  Follow-up chemistries 11.  OSA.  CPAP 12.  Gout.  Prednisone colchicine completed.  Continue allopurinol 13.  Diabetes mellitus.  Jardiance 10 mg daily, NovoLog 6 units 3 times daily, Levemir 50 units twice daily.  Diabetic teaching 14.  Hypertension.  Cozaar 25 mg daily.  Monitor with increased mobility 15.  Acute exacerbation CHF.  Aldactone 25 mg daily, Demadex 40 mg Monday Wednesday Friday.  Monitor for any signs of fluid overload 16.  Obesity.  BMI 47.  Dietary follow-up 17. Insomnia: continue trazodone '100mg'$  HS 18. Anxiety: continue Xanax 0.'5mg'$  BID 19. Disposition: HFU scheduled   I have personally performed a face to face diagnostic evaluation, including, but not limited to relevant history and physical exam findings, of this patient and developed relevant assessment and plan.  Additionally, I have reviewed and concur with the physician assistant's documentation above.  Leeroy Cha, MD   Izora Ribas, MD 05/22/2021

## 2021-05-22 NOTE — Progress Notes (Signed)
Patient is hypotensive tonight, asymptomatic, notified on-call, held Losartan as advised. CPAP applied by Respiratory Therapist.

## 2021-05-22 NOTE — Discharge Instructions (Addendum)
Inpatient Rehab Discharge Instructions  Jeffrey [name] Discharge date and time: No discharge date for patient encounter.   Activities/Precautions/ Functional Status: Activity: As tolerated Diet: Carb modified Wound Care: Routine skin checks Functional status:  ___ No restrictions     ___ Walk up steps independently ___ 24/7 supervision/assistance   ___ Walk up steps with assistance ___ Intermittent supervision/assistance  ___ Bathe/dress independently ___ Walk with walker     __x_ Bathe/dress with assistance ___ Walk Independently    ___ Shower independently ___ Walk with assistance    ___ Shower with assistance ___ No alcohol     ___ Return to work/school ________  Special Instructions: No driving smoking or alcohol    COMMUNITY REFERRALS UPON DISCHARGE:    Outpatient: PT & OT             Agency:[facility] [address]  ADDRESS: [address] [place] [phone]              Appointment Date/Time:CALL PATIENT TO SET UP APPOINTMENTS  Medical Equipment/Items [place]                                                 Agency/Supplier:[facility]  [phone] [employer] [employer]: [phone]    My questions have been answered and I understand these instructions. I will adhere to these goals and the provided educational materials after my discharge from the hospital.  Patient/Caregiver Signature _______________________________ Date __________  Clinician Signature _______________________________________ Date __________  Please bring this form and your medication list with you to all your follow-up doctor's appointments.

## 2021-05-22 NOTE — Progress Notes (Signed)
Patient arrived on unit, oriented to unit. Reviewed medications, therapy schedule, rehab routine and plan of care. States an understanding of information reviewed. No complications noted at this time. Patient reports no pain and is AX4 Jeffrey [name]  

## 2021-05-23 DIAGNOSIS — R570 Cardiogenic shock: Secondary | ICD-10-CM

## 2021-05-23 LAB — GLUCOSE, CAPILLARY
Glucose-Capillary: 136 mg/dL — ABNORMAL HIGH (ref 70–99)
Glucose-Capillary: 140 mg/dL — ABNORMAL HIGH (ref 70–99)
Glucose-Capillary: 139 mg/dL — ABNORMAL HIGH (ref 70–99)
Glucose-Capillary: 155 mg/dL — ABNORMAL HIGH (ref 70–99)

## 2021-05-23 LAB — CBC WITH DIFFERENTIAL/PLATELET
Abs Immature Granulocytes: 0.03 10*3/uL (ref 0.00–0.07)
Basophils Absolute: 0 10*3/uL (ref 0.0–0.1)
Basophils Relative: 1 %
Eosinophils Absolute: 0.1 10*3/uL (ref 0.0–0.5)
Eosinophils Relative: 2 %
HCT: 41.5 % (ref 39.0–52.0)
Hemoglobin: 13.7 g/dL (ref 13.0–17.0)
Immature Granulocytes: 0 %
Lymphocytes Relative: 33 %
Lymphs Abs: 2.4 10*3/uL (ref 0.7–4.0)
MCH: 27.1 pg (ref 26.0–34.0)
MCHC: 33 g/dL (ref 30.0–36.0)
MCV: 82.2 fL (ref 80.0–100.0)
Monocytes Absolute: 0.6 10*3/uL (ref 0.1–1.0)
Monocytes Relative: 9 %
Neutro Abs: 4.1 10*3/uL (ref 1.7–7.7)
Neutrophils Relative %: 55 %
Platelets: 413 10*3/uL — ABNORMAL HIGH (ref 150–400)
RBC: 5.05 MIL/uL (ref 4.22–5.81)
RDW: 16.6 % — ABNORMAL HIGH (ref 11.5–15.5)
WBC: 7.4 10*3/uL (ref 4.0–10.5)
nRBC: 0 % (ref 0.0–0.2)

## 2021-05-23 LAB — COMPREHENSIVE METABOLIC PANEL
ALT: 18 U/L (ref 0–44)
AST: 19 U/L (ref 15–41)
Albumin: 2.7 g/dL — ABNORMAL LOW (ref 3.5–5.0)
Alkaline Phosphatase: 62 U/L (ref 38–126)
Anion gap: 9 (ref 5–15)
BUN: 30 mg/dL — ABNORMAL HIGH (ref 6–20)
CO2: 21 mmol/L — ABNORMAL LOW (ref 22–32)
Calcium: 9.8 mg/dL (ref 8.9–10.3)
Chloride: 105 mmol/L (ref 98–111)
Creatinine, Ser: 1.77 mg/dL — ABNORMAL HIGH (ref 0.61–1.24)
GFR, Estimated: 47 mL/min — ABNORMAL LOW (ref >60)
Glucose, Bld: 139 mg/dL — ABNORMAL HIGH (ref 70–99)
Potassium: 4.3 mmol/L (ref 3.5–5.1)
Sodium: 135 mmol/L (ref 135–145)
Total Bilirubin: 0.7 mg/dL (ref 0.3–1.2)
Total Protein: 7 g/dL (ref 6.5–8.1)

## 2021-05-23 MED ORDER — LOSARTAN POTASSIUM 25 MG PO TABS
12.5000 mg | ORAL_TABLET | Freq: Every day | ORAL | Status: DC
Start: 2021-05-23 — End: 2021-05-28
  Administered 2021-05-23 – 2021-05-27 (×5): 12.5 mg via ORAL
  Filled 2021-05-23 (×5): qty 1

## 2021-05-23 MED ORDER — JUVEN PO PACK
1.0000 | PACK | Freq: Two times a day (BID) | ORAL | Status: DC
  Administered 2021-05-23 – 2021-05-28 (×13): 1 via ORAL
  Filled 2021-05-23 (×11): qty 1

## 2021-05-23 NOTE — Progress Notes (Signed)
PMR Admission Coordinator Pre-Admission Assessment  Patient: Jeffrey [name] is an 45 y.o., male MRN: [id number] DOB: 05/18/1976 Height: 5' 8" (172.7 cm) Weight: (!) 136.3 kg  Insurance Information HMO:     PPO:      PCP:      IPA:      80/20:      OTHER:  PRIMARY: BCBS [employer] [place] Medicaid      Policy#: [id number]      Subscriber: patient CM Name: [place]      Phone#: [phone] ext [phone]       Fax#: [phone] (has epic access) Pre-Cert#: [id number]      Employer: Not employed Benefits:  Phone #:  [phone]    Name: Availity portal Eff. Date: 04/04/20     Deduct: $0      Out of Pocket Max: $0      Life Max: N/A CIR: 100%      SNF: 100% with medical necessity Outpatient: 27 visits combined     Co-Pay: $3 copay Home Health: 100%      Co-Pay: none DME: 100% with medical necessity     Co-Pay: none Providers: in network  SECONDARY:        Policy#:       Phone#:    Financial Counselor:        Phone#:    The "Data Collection Information Summary" for patients in Inpatient Rehabilitation Facilities with attached "Privacy Act Statement-Health Care Records" was provided and verbally reviewed with: N/A  Emergency Contact Information Contact Information     Name Relation Home Work Mobile   Patterson,[name] Spouse   [phone]   Henry,Faith Mother   [phone]       Current Medical History  Patient Admitting Diagnosis: Debility  History of Present Illness: A 45yo AAM with a hx poorly controlled HTN, CKD stage 3a, PAF, DM2, morbid obesity, chronic systolic CHF, NIDM s/p BSci Emblem MRI S-ICD.  He also has a hx of right renal cell CA s/p nephrectomy.  He is followed by EP and AHF.  He was in his USOH until this past Saturday and started to feel bad.  He has been compliant with his CPAP.  He admits to not taking any of his meds on Saturday because he was nauseated and did not take them this am either.  He does not weight himself daily so not sure if he has gained any weight.  He says that he started feeling SOB and felt like his heart was racing.  His sx persisted over the weekend and today he decided to come to the ER.     In ER was found to be in afib with RVR in the 160's with frequent runs of NSVT.  He denies any chest pain or pressure and complains of PND, orthopnea and DOE.  CPAP does help his SOB.  He denies any shocks from his device.  Na 137, K+ 3.6, SCr 1.99 (baseline 1.49 in April 22), BNp 1407, hsTrop 75>64 and WBC 9.6.  Cxray showed mild CHF.  EKG showed atrial fibrillation with RVR and wide complex tachy that may be aberration but also could be NSVT.  Cardiology is now asked to consult.  On 7/27 underwent cardioversion with intubation, extubated 8/4. Hospital course complicated by gout in B feet and R elbow.  PT/OT evaluations completed with recommendations for acute inpatient rehab admission.  Patient's medical record from [place] has been reviewed by the rehabilitation admission coordinator and physician.  Past Medical   History  Past Medical History:  Diagnosis Date   Anxiety    Aspirin allergy    Childhood asthma    Chronic systolic CHF (congestive heart failure) (HCC)    a. EF 20-25% in 2012. b. EF 45-50% in 10/2011 with nonischemic nuc - presumed NICM. c. 12/2014 Echo: Sev depressed LV fxn, sev dil LV, mild LVH, mild MR, sev dil LA, mildly reduced RV fxn.   CKD (chronic kidney disease) stage 2, GFR 60-89 ml/min    H/O vasectomy 12/2019   High cholesterol    Hypertension    Morbid obesity (HCC)    Nephrolithiasis    OSA on CPAP    Paroxysmal atrial fibrillation (HCC)    Presumed NICM    a. 04/2014 Myoview: EF 26%, glob HK, sev glob HK, ? prior infarct;  b. Never cathed 2/2 CKD.   Renal cell carcinoma (HCC)    a. s/p Rt robotic assisted partial converted to radical nephrectomy on 01/2013.   Troponin level elevated    a. 04/2014, 12/2014: felt due to CHF.   Type II diabetes mellitus (HCC)    Ventricular tachycardia (HCC)    a. appropriate ICD  therapy 12/2017    Family History   family history includes Diabetes in his maternal aunt; Hypertension in his mother.  Prior Rehab/Hospitalizations Has the patient had prior rehab or hospitalizations prior to admission? Yes  Has the patient had major surgery during 100 days prior to admission? Yes   Current Medications  Current Facility-Administered Medications:    0.9 %  sodium chloride infusion, 250 mL, Intravenous, PRN, Bensimhon, Daniel R, MD, Stopped at 04/30/21 2254   acetaminophen (TYLENOL) tablet 650 mg, 650 mg, Oral, Q6H PRN, Bensimhon, Daniel R, MD, 650 mg at 05/09/21 1612   albuterol (PROVENTIL) (2.5 MG/3ML) 0.083% nebulizer solution 2.5 mg, 2.5 mg, Nebulization, Once, Bensimhon, Daniel R, MD   allopurinol (ZYLOPRIM) tablet 100 mg, 100 mg, Oral, Daily, Bensimhon, Daniel R, MD, 100 mg at 05/22/21 0819   ALPRAZolam (XANAX) tablet 0.5 mg, 0.5 mg, Oral, BID, Bensimhon, Daniel R, MD, 0.5 mg at 05/22/21 0818   amiodarone (PACERONE) tablet 200 mg, 200 mg, Oral, BID, Bensimhon, Daniel R, MD, 200 mg at 05/22/21 0819   apixaban (ELIQUIS) tablet 5 mg, 5 mg, Oral, BID, Bensimhon, Daniel R, MD, 5 mg at 05/22/21 0818   Chlorhexidine Gluconate Cloth 2 % PADS 6 each, 6 each, Topical, Daily, Bensimhon, Daniel R, MD, 6 each at 05/21/21 1728   docusate sodium (COLACE) capsule 100 mg, 100 mg, Oral, BID, Bensimhon, Daniel R, MD, 100 mg at 05/22/21 0818   empagliflozin (JARDIANCE) tablet 10 mg, 10 mg, Oral, Daily, Bensimhon, Daniel R, MD, 10 mg at 05/22/21 0819   insulin aspart (novoLOG) injection 0-20 Units, 0-20 Units, Subcutaneous, TID WC, Bensimhon, Daniel R, MD, 4 Units at 05/22/21 0640   insulin aspart (novoLOG) injection 0-5 Units, 0-5 Units, Subcutaneous, QHS, Bensimhon, Daniel R, MD, 2 Units at 05/13/21 2139   insulin aspart (novoLOG) injection 6 Units, 6 Units, Subcutaneous, TID WC, Finch, Lindsay Nicole, PA-C, 6 Units at 05/22/21 0823   insulin detemir (LEVEMIR) injection 50 Units, 50  Units, Subcutaneous, BID, Bensimhon, Daniel R, MD, 50 Units at 05/22/21 0822   losartan (COZAAR) tablet 25 mg, 25 mg, Oral, QHS, Bensimhon, Daniel R, MD, 25 mg at 05/21/21 2152   MEDLINE mouth rinse, 15 mL, Mouth Rinse, BID, Bensimhon, Daniel R, MD, 15 mL at 05/21/21 2156   nitroGLYCERIN (NITROSTAT) SL tablet 0.4 mg, 0.4   mg, Sublingual, Q5 Min x 3 PRN, Bensimhon, Daniel R, MD   ondansetron (ZOFRAN) injection 4 mg, 4 mg, Intravenous, Q6H PRN, Bensimhon, Daniel R, MD   pantoprazole (PROTONIX) EC tablet 40 mg, 40 mg, Oral, Daily, Bensimhon, Daniel R, MD, 40 mg at 05/22/21 0818   sertraline (ZOLOFT) tablet 25 mg, 25 mg, Oral, Daily, Finch, Lindsay Nicole, PA-C, 25 mg at 05/22/21 0819   sodium chloride flush (NS) 0.9 % injection 10-40 mL, 10-40 mL, Intracatheter, Q12H, Bensimhon, Daniel R, MD, 10 mL at 05/21/21 2153   sodium chloride flush (NS) 0.9 % injection 10-40 mL, 10-40 mL, Intracatheter, PRN, Bensimhon, Daniel R, MD   sodium chloride flush (NS) 0.9 % injection 3 mL, 3 mL, Intravenous, Q12H, Bensimhon, Daniel R, MD, 3 mL at 05/21/21 2153   sodium chloride flush (NS) 0.9 % injection 3 mL, 3 mL, Intravenous, PRN, Bensimhon, Daniel R, MD   sorbitol 70 % solution 30 mL, 30 mL, Per Tube, Daily PRN, Bensimhon, Daniel R, MD, 30 mL at 05/07/21 0946   spironolactone (ALDACTONE) tablet 25 mg, 25 mg, Oral, Daily, Bensimhon, Daniel R, MD, 25 mg at 05/22/21 0818   traMADol (ULTRAM) tablet 100 mg, 100 mg, Oral, Q12H PRN, Bensimhon, Daniel R, MD, 100 mg at 05/19/21 2238   traZODone (DESYREL) tablet 100 mg, 100 mg, Oral, QHS, Bensimhon, Daniel R, MD, 100 mg at 05/21/21 2152  Patients Current Diet:  Diet Order             Diet - low sodium heart healthy           Diet Carb Modified Fluid consistency: Thin; Room service appropriate? Yes  Diet effective now                   Precautions / Restrictions Precautions Precautions: Fall Precaution Comments: B knee pain Restrictions Weight Bearing  Restrictions: No   Has the patient had 2 or more falls or a fall with injury in the past year? No  Prior Activity Level Limited Community (1-2x/wk): Went out about 3 times a week.  Prior Functional Level Self Care: Did the patient need help bathing, dressing, using the toilet or eating? Independent  Indoor Mobility: Did the patient need assistance with walking from room to room (with or without device)? Independent  Stairs: Did the patient need assistance with internal or external stairs (with or without device)? Independent  Functional Cognition: Did the patient need help planning regular tasks such as shopping or remembering to take medications? Independent  Home Assistive Devices / Equipment Home Equipment: None  Prior Device Use: Indicate devices/aids used by the patient prior to current illness, exacerbation or injury? None of the above  Current Functional Level Cognition  Overall Cognitive Status: Within Functional Limits for tasks assessed Orientation Level: Oriented X4 General Comments: pleasant, cooperative    Extremity Assessment (includes Sensation/Coordination)  Upper Extremity Assessment: Overall WFL for tasks assessed RUE Deficits / Details: gout flare is resolved, denies pain, full AROM LUE Deficits / Details: grossly 3/5 shoulder flexion and elbow flexion  Lower Extremity Assessment: Defer to PT evaluation RLE Deficits / Details: only able to bend knee 45 degrees due to edema and pain, 2/5 knee extension and flexion LLE Deficits / Details: grossly 60 degrees knee flexion with 3/5 knee extension and 2/5 knee flexion    ADLs  Overall ADL's : Needs assistance/impaired Eating/Feeding: Independent, Bed level Grooming: Modified independent, Sitting, Wash/dry hands Upper Body Bathing: Minimal assistance, Sitting Lower Body Bathing: Maximal assistance,   Sit to/from stand Upper Body Dressing : Set up, Sitting Lower Body Dressing: Minimal assistance, Sit to/from stand,  With adaptive equipment Lower Body Dressing Details (indicate cue type and reason): pt able to bend to feet to manage sock but noted increased difficulty breathing and safety concerns with leaning forward. Educated on use of Reacher and sock aide with pt able to return demo with Min A. Toilet Transfer: Min guard, Ambulation, RW, Regular Toilet, Grab bars Toilet Transfer Details (indicate cue type and reason): increased effort for power up and lowering down to regular toilet, use of grab bars Toileting- Clothing Manipulation and Hygiene: Supervision/safety, Sit to/from stand, Sitting/lateral lean Toileting - Clothing Manipulation Details (indicate cue type and reason): able to perform hygiene, clothing mgmt with increased time Functional mobility during ADLs: Min guard, Rolling walker General ADL Comments: Pt progressing well towards OT goals, limited by chronic knee pain. Discussed ADL routine and home setup with pt reporting need for shower chair (educated on pain mgmt and energy conservation benefits) and RW for stability/pain mgmt with mobility    Mobility  Overal bed mobility: Needs Assistance Bed Mobility: Supine to Sit Rolling: Min assist Sidelying to sit: Min assist, +2 for physical assistance Supine to sit: Modified independent (Device/Increase time) Sit to supine: Supervision General bed mobility comments: increased time/effort    Transfers  Overall transfer level: Needs assistance Equipment used: 4-wheeled walker Transfer via Lift Equipment: Stedy Transfers: Sit to/from Stand Sit to Stand: Supervision, Min guard General transfer comment: from low bed to rollator with Supervision but min guard for stand>sit due to fatigue/decreased safety technique.    Ambulation / Gait / Stairs / Wheelchair Mobility  Ambulation/Gait Ambulation/Gait assistance: Min guard, +2 safety/equipment Gait Distance (Feet): 140 Feet (50ft x2 with standing break between (100ft total), seated break, then  75ft x2 with standing break between (140ft total)) Assistive device: 4-wheeled walker Gait Pattern/deviations: Step-through pattern, Decreased stride length, Wide base of support General Gait Details: Pt with low steps, somewhat stiff-knee gait pattern due to B knee pain but improved knee flexion as distance progressed, no buckling this date but remains quick to fatigue and increased work of breathing with exertion. Chair follow for safety and he needed 1 seated break ~3 minutes and 2 standing breaks ~30 sec each trial. SpO2 92% on RA with exertion and 99% on RA at rest. Needs cues for use of brakes with rollator and somewhat unsafe with stand>sit due to fatigue needing min guard for safety. Gait velocity: Reduced Gait velocity interpretation: 1.31 - 2.62 ft/sec, indicative of limited community ambulator    Posture / Balance Dynamic Sitting Balance Sitting balance - Comments: able to sit without UE support Balance Overall balance assessment: Needs assistance Sitting-balance support: No upper extremity supported, Feet unsupported Sitting balance-Leahy Scale: Good Sitting balance - Comments: able to sit without UE support Standing balance support: Bilateral upper extremity supported Standing balance-Leahy Scale: Poor Standing balance comment: Relies on BUE support of RW    Special needs/care consideration CPAP and Diabetic management yes   Previous Home Environment (from acute therapy documentation) Living Arrangements: Spouse/significant other, Children Available Help at Discharge: Family, Available 24 hours/day Type of Home: House Home Layout: One level Home Access: Stairs to enter, Ramped entrance Entrance Stairs-Rails: Right Entrance Stairs-Number of Steps: 5; ramp in back Bathroom Shower/Tub: Walk-in shower Bathroom Toilet: Standard  Discharge Living Setting Plans for Discharge Living Setting: Patient's home, House, Lives with (comment) (Lives with wife and 2 children) Type of  Home at Discharge:   House Discharge Home Layout: One level Discharge Home Access: Stairs to enter Entrance Stairs-Rails: Right, Left, Can reach both Entrance Stairs-Number of Steps: 5-6 Discharge Bathroom Shower/Tub: Walk-in shower, Curtain Discharge Bathroom Toilet: Standard Discharge Bathroom Accessibility: Yes How Accessible: Accessible via walker  Social/Family/Support Systems Patient Roles: Spouse, Parent (Has a wife, 10 yo Dtr and 3 yo son.) Contact Information: [name] Patterson - wife - [phone] Anticipated Caregiver: Wife, cousin, sister Ability/Limitations of Caregiver: Wife works as a school teacher. Caregiver Availability: Intermittent (Says he can get cousin or sister to stay while wife works) Discharge Plan Discussed with Primary Caregiver: Yes Is Caregiver In Agreement with Plan?: Yes Does Caregiver/Family have Issues with Lodging/Transportation while Pt is in Rehab?: No  Goals Patient/Family Goal for Rehab: PT/OT supervision goals Expected length of stay: 12-14 days Pt/Family Agrees to Admission and willing to participate: Yes Program Orientation Provided & Reviewed with Pt/Caregiver Including Roles  & Responsibilities: Yes  Decrease burden of Care through IP rehab admission: N/A  Possible need for SNF placement upon discharge: Not anticipated  Patient Condition: I have reviewed medical records from [facility], spoken with CM, and patient and spouse. I met with patient at the bedside and discussed via phone for inpatient rehabilitation assessment.  Patient will benefit from ongoing PT and OT, can actively participate in 3 hours of therapy a day 5 days of the week, and can make measurable gains during the admission.  Patient will also benefit from the coordinated team approach during an Inpatient Acute Rehabilitation admission.  The patient will receive intensive therapy as well as Rehabilitation physician, nursing, social worker, and care management interventions.   Due to bladder management, bowel management, safety, skin/wound care, disease management, medication administration, pain management, and patient education the patient requires 24 hour a day rehabilitation nursing.  The patient is currently min assist with mobility and basic ADLs.  Discharge setting and therapy post discharge at home with home health is anticipated.  Patient has agreed to participate in the Acute Inpatient Rehabilitation Program and will admit today.  Preadmission Screen Completed By: Eugenia Logue with updates by Leiloni Smithers E Ashvik Grundman, 05/22/2021 10:16 AM ______________________________________________________________________   Discussed status with Dr. Raulkar on 05/22/21  at 10:16 AM  and received approval for admission today.  Admission Coordinator:  Venicia Vandall E Rowene Suto, PT, time 10:16 AM /Date 05/22/21    Assessment/Plan: Diagnosis: Cardiopulmonary Debility Does the need for close, 24 hr/day Medical supervision in concert with the patient's rehab needs make it unreasonable for this patient to be served in a less intensive setting? Yes Co-Morbidities requiring supervision/potential complications: obesity (BMI 45.69), acute renal failure imposed on stage 3 chronic kidney disease, atrial fibrillation with RVR, acute hypoxemic respiratory failure, cardiogenic shock Due to bladder management, bowel management, safety, skin/wound care, disease management, medication administration, pain management, and patient education, does the patient require 24 hr/day rehab nursing? Yes Does the patient require coordinated care of a physician, rehab nurse, PT, OT to address physical and functional deficits in the context of the above medical diagnosis(es)? Yes Addressing deficits in the following areas: balance, endurance, locomotion, strength, transferring, bowel/bladder control, bathing, dressing, feeding, grooming, toileting, and psychosocial support Can the patient actively participate in an intensive  therapy program of at least 3 hrs of therapy 5 days a week? Yes The potential for patient to make measurable gains while on inpatient rehab is excellent Anticipated functional outcomes upon discharge from inpatient rehab: modified independent PT, modified independent OT, independent SLP Estimated rehab length of stay   to reach the above functional goals is: 5-7 days Anticipated discharge destination: Home 10. Overall Rehab/Functional Prognosis: excellent   MD Signature: Krutika Raulkar, MD 

## 2021-05-23 NOTE — Progress Notes (Signed)
Physical Therapy Session Note  Patient Details  Name: Jeffrey [name] MRN: [id number] Date of Birth: 1976/06/16  Today's Date: 05/23/2021 PT Individual Time: [id number] PT Individual Time Calculation (min): 53 min   Short Term Goals: Week 1:  STG=LTG due to LOS  Skilled Therapeutic Interventions/Progress Updates:   Received pt semi-reclined in bed with mother present at bedside, pt agreeable to PT treatment, and reported pain 8/10 in R knee but denied any pain interventions. Repositioning, rest breaks, and distraction done to reduce pain levels. Pt reported feeling depressed today due to missing his aunt's funeral; provided therapeutic listening, support, and encouragement. Session with emphasis on functional mobility, generalized strengthening, dynamic standing balance/coordination, gait training, and improved endurance with activity. Pt performed bed mobility with HOB elevated with supervision x 2 trials throughout session. Pt ambulated 12f without AD and min A to WC. Donned shoes with mod A and transported to/from room on 5C [place] for time management and energy conservation purposes. Transferred sit<>stand without AD and CGA and donned LiteGait harness standing with max A. Pt stepped on/off LiteGait Treadmill with CGA and worked on gait training for the following time frames with emphasis on LE strength and endurance: -3 minutes and 24 seconds at 0.3-0.450m for [id number] BUE support. Pt demonstrated narrow BOS, decreased step/stride length, with heavy reliance on UE support with mild increase in R knee pain  -4 minutes and 29 seconds at [id number] BUE support with same impairments noted - cues to relax shoulders and increase step length.  Pt required rest brakes throughout session due to fatigue and pain but remains extremely motivated to participate in therapy. Pt ambulated 8ft [id number] AD and CGA back to bed and reported 7/10 fatigue at end of session. Concluded session with pt  semi-reclined in bed, needs within reach, and bed alarm on. Provided pt with fresh ice water.   Therapy Documentation Precautions:  Restrictions Weight Bearing Restrictions: No  Therapy/Group: Individual Therapy Aliceson Dolbow MAlfonse AlpersPT   05/23/2021, 7:33 AM

## 2021-05-23 NOTE — Evaluation (Signed)
Physical Therapy Assessment and Plan  Patient Details  Name: Jeffrey [name] MRN: [id number] Date of Birth: September 06, 1976  PT Diagnosis: Abnormality of gait, Difficulty walking, Muscle weakness, and Pain in joint Rehab Potential: Excellent ELOS: 7-10   Today's Date: 05/23/2021 PT Individual Time: 7829-5621 PT Individual Time Calculation (min): 75 min    Hospital Problem: Active Problems:   Cardiogenic shock [facility])   Past Medical History:  Past Medical History:  Diagnosis Date   Anxiety    Aspirin allergy    Childhood asthma    Chronic systolic CHF (congestive heart failure) ([place])    a. EF 20-25% in 2012. b. EF 45-50% in 10/2011 with nonischemic nuc - presumed NICM. c. 12/2014 Echo: Sev depressed LV fxn, sev dil LV, mild LVH, mild MR, sev dil LA, mildly reduced RV fxn.   CKD (chronic kidney disease) stage 2, GFR 60-89 ml/min    H/O vasectomy 12/2019   High cholesterol    Hypertension    Morbid obesity ([place])    Nephrolithiasis    OSA on CPAP    Paroxysmal atrial fibrillation ([place])    Presumed NICM    a. 04/2014 Myoview: EF 26%, glob HK, sev glob HK, ? prior infarct;  b. Never cathed 2/2 CKD.   Renal cell carcinoma ([place])    a. s/p Rt robotic assisted partial converted to radical nephrectomy on 01/2013.   Troponin level elevated    a. 04/2014, 12/2014: felt due to CHF.   Type II diabetes mellitus ([place])    Ventricular tachycardia ([place])    a. appropriate ICD therapy 12/2017   Past Surgical History:  Past Surgical History:  Procedure Laterality Date   APPENDECTOMY  07/2004   BIOPSY  02/10/2021   Procedure: BIOPSY;  Surgeon: Gatha Mayer, MD;  Location: [facility] ENDOSCOPY;  Service: Endoscopy;;   CARDIAC CATHETERIZATION N/A 05/17/2015   Procedure: Right/Left Heart Cath and Coronary Angiography;  Surgeon: Jolaine Artist, MD;  Location: [place] CV LAB;  Service: Cardiovascular;  Laterality: N/A;   CARDIOVERSION N/A 04/30/2021   Procedure: CARDIOVERSION;  Surgeon: Larey Dresser, MD;   Location: [facility] ENDOSCOPY;  Service: Cardiovascular;  Laterality: N/A;   COLONOSCOPY WITH PROPOFOL N/A 02/10/2021   Procedure: COLONOSCOPY WITH PROPOFOL;  Surgeon: Gatha Mayer, MD;  Location: [facility] ENDOSCOPY;  Service: Endoscopy;  Laterality: N/A;   EP IMPLANTABLE DEVICE N/A 05/17/2015   Procedure: SubQ ICD Implant;  Surgeon: Will Meredith Leeds, MD;  Location: [place] CV LAB;  Service: Cardiovascular;  Laterality: N/A;   ESOPHAGOGASTRODUODENOSCOPY (EGD) WITH PROPOFOL N/A 02/10/2021   Procedure: ESOPHAGOGASTRODUODENOSCOPY (EGD) WITH PROPOFOL;  Surgeon: Gatha Mayer, MD;  [facility] END[facility];  Service: Endoscopy;  Laterality: N/A;   RIGHT HEART CATH N/A 05/13/2021   Procedure: RIGHT HEART CATH;  Surgeon: Jolaine Artist, MD;  Location: [place] CV LAB;  Service: Cardiovascular;  Laterality: N/A;   ROBOTIC ASSISTED LAPAROSCOPIC LYSIS OF ADHESION  01/13/2013   Procedure: ROBOTIC ASSISTED LAPAROSCOPIC LYSIS OF ADHESION EXTENSIVE;  Surgeon: Alexis Frock, MD;  Location: [facility] ORS;  Service: Urology;;   ROBOTIC ASSITED PARTIAL NEPHRECTOMY Right 01/13/2013   Procedure: ROBOTIC ASSITED PARTIAL NEPHRECTOMY CONVERTED TO ROBOTIC ASSISTED RIGHT RADICAL NEPHRECTOMY;  Surgeon: Alexis Frock, MD;  Location: [facility] ORS;  Service: Urology;  Laterality: Right;   SUBQ ICD CHANGEOUT N/A 05/16/2020   Procedure: SUBQ ICD CHANGEOUT;  Surgeon: [name], MD;  Location: [place] CV LAB;  Service: Cardiovascular;  Laterality: N/A;   SUBQ ICD CHANGEOUT N/A  05/15/2020   Procedure: SUBQ ICD CHANGEOUT;  Surgeon: [name], MD;  Location: [place] CV LAB;  Service: Cardiovascular;  Laterality: N/A;   TEE WITHOUT CARDIOVERSION N/A 04/30/2021   Procedure: TRANSESOPHAGEAL ECHOCARDIOGRAM (TEE);  Surgeon: Larey Dresser, MD;  Location: [facility] ENDOSCOPY;  Service: Cardiovascular;  Laterality: N/A;   VASECTOMY      Assessment & Plan Clinical Impression: Jeffrey [name] is a 45 year old right-handed male with history  of anxiety, chronic diastolic congestive heart failure, childhood asthma chronic systolic CHF CKD stage II, morbid obesity with BMI 47, hyperlipidemia, hypertension, PAF, nonischemic cardiomyopathy status post ICD, renal cell carcinoma status post nephrectomy, type 2 diabetes mellitus.  Per chart review lives with spouse.  Independent but sedentary prior to admission.  1 level home 5 steps to entry.  Presented 04/29/2021 with intermittent nausea as well as shortness of breath and lower extremity edema.  In the ER he was found to be in A. fib with RVR in the 160s with frequent runs of NSVT.  He denied any chest pain.  Sodium 137 potassium 3.6 creatinine 1.99 from baseline 1.49 in April 2022, troponin 75-64, WBC 9.6.  Chest x-ray showed mild CHF.  Cardiology services consulted placed on IV diuresis.  Echocardiogram with ejection fraction of less than 21% grade 3 diastolic dysfunction.  He did undergo emergent TEE guided DCCV 7/27 due to inability to control heart rate.  Not a candidate for ablation due to body habitus.  He had been placed on amiodarone stopped due to concern for toxicity and adjusted accordingly.  He did remain on Eliquis as directed.  Intermittent bouts of orthostasis adjustments made per cardiology services.  Hospital course complicated by acute hypoxemic respiratory failure as noted respiratory cultures positive for rare S-aureus treated with linezolid and cefepime.  AKI on CKD creatinine peaked 4.65 felt to be possibly also related to diuresis and his torsemide was adjusted.  He did have some urinary obstruction likely playing a role in AKI urology consulted placed a Foley catheter tube that is since been removed.  Patient is currently awaiting plan for LVAD as stabilizes from a cardiac standpoint.  Patient did have acute gout flareup responding well to prednisone and colchicine which is since resolved and continues on allopurinol.  Therapy evaluations completed and patient was admitted for a  comprehensive rehab program due to decreased functional mobility. Complains of weakness and fatigue. Patient transferred to CIR on 05/22/2021 .   Patient currently requires min with mobility secondary to muscle weakness and decreased cardiorespiratoy endurance.  Prior to hospitalization, patient was independent  with mobility and lived with Family, Spouse in a House home.  Home access is 5; ramp in backStairs to enter, Ramped entrance.  Patient will benefit from skilled PT intervention to maximize safe functional mobility, minimize fall risk, and decrease caregiver burden for planned discharge home with 24 hour supervision.  Anticipate patient will benefit from follow up OP at discharge.  PT - End of Session Activity Tolerance: Tolerates 30+ min activity with multiple rests Endurance Deficit: Yes Endurance Deficit Description: Pt with increased respiration after activity PT Assessment Rehab Potential (ACUTE/IP ONLY): Excellent PT Barriers to Discharge: [place] home environment;Home environment access/layout;Decreased caregiver support PT Patient demonstrates impairments in the following area(s): Motor;Safety;Endurance PT Transfers Functional Problem(s): Bed Mobility;Bed to Chair;Car;Furniture;Floor PT Locomotion Functional Problem(s): Ambulation;Stairs PT Plan PT Intensity: Minimum of 1-2 x/day ,45 to 90 minutes PT Frequency: 5 out of 7 days PT Duration Estimated Length of Stay: 7-10 PT Treatment/Interventions:  Ambulation/gait training;Community reintegration;Discharge planning;Disease management/prevention;DME/adaptive equipment instruction;Functional mobility training;Neuromuscular re-education;Pain management;Patient/family education;Psychosocial support;Stair training;Therapeutic Activities;Therapeutic Exercise;UE/LE Strength taining/ROM;UE/LE Coordination activities;Wheelchair propulsion/positioning PT Transfers Anticipated Outcome(s): mod I PT Locomotion Anticipated Outcome(s): Mod I  gait with LRAD PT Recommendation Patient destination: Home Equipment Recommended: Rolling walker with 5" wheels Equipment Details: Pt could wean off RW prior to d/c   PT Evaluation Precautions/Restrictions Precautions Precautions: Fall Precaution Comments: B knee pain, premorbid lightheadedness (pt thinks related to meds) Restrictions Weight Bearing Restrictions: No General   Vital SignsTherapy Vitals Temp: 98.8 F (37.1 C) Temp Source: Oral Pulse Rate: 100 Resp: 16 BP: 112/86 Patient Position (if appropriate): Lying Oxygen Therapy SpO2: 95 % O2 Device: Room Air Pain   Home Living/Prior Functioning Home Living Available Help at Discharge: Available PRN/intermittently Type of Home: House Home Access: Stairs to enter;Ramped entrance Entrance Stairs-Number of Steps: 5; ramp in back Entrance Stairs-Rails: Right;Left;Can reach both Home Layout: One level  Lives With: Family;Spouse Prior Function Level of Independence: Independent with gait;Independent with homemaking with ambulation;Independent with basic ADLs  Able to Take Stairs?: Yes Driving: No Vocation: On disability Leisure: Hobbies-yes (Comment) Comments: plays with kids, reading to kids Vision/Perception  Perception Perception: Within Functional Limits Praxis Praxis: Intact  Cognition Overall Cognitive Status: Within Functional Limits for tasks assessed Arousal/Alertness: Awake/alert Orientation Level: Oriented X4 Attention: Focused;Sustained Focused Attention: Appears intact Sustained Attention: Appears intact Memory: Appears intact Awareness: Appears intact Problem Solving: Appears intact Safety/Judgment: Appears intact Sensation Sensation Light Touch: Appears Intact Hot/Cold: Appears Intact Proprioception: Appears Intact Stereognosis: Appears Intact Coordination Gross Motor Movements are Fluid and Coordinated: No Coordination and Movement Description: Functional gross motor limited by body  habitus, mildly tremulous UEs Motor  Motor Motor: Abnormal postural alignment and control Motor - Skilled Clinical Observations: Mildly affected by body habitus, note that pt compensates well functionally   Trunk/Postural Assessment  Cervical Assessment Cervical Assessment: Within Functional Limits Thoracic Assessment Thoracic Assessment: Within Functional Limits Lumbar Assessment Lumbar Assessment: Within Functional Limits Postural Control Postural Control: Deficits on evaluation  Balance Balance Balance Assessed: Yes Dynamic Sitting Balance Dynamic Sitting - Balance Support: During functional activity;No upper extremity supported Dynamic Sitting - Level of Assistance: 5: Stand by assistance Dynamic Standing Balance Dynamic Standing - Balance Support: During functional activity;No upper extremity supported Dynamic Standing - Level of Assistance: 4: Min assist Extremity Assessment      RLE Assessment RLE Assessment: Within Functional Limits General Strength Comments: grossly 5/5, hip flexion 4/5 LLE Assessment LLE Assessment: Within Functional Limits General Strength Comments: grossly 5/5, hip flexion 4/5  Care Tool Care Tool Bed Mobility Roll left and right activity   Roll left and right assist level: Minimal Assistance - Patient > 75%    Sit to lying activity   Sit to lying assist level: Supervision/Verbal cueing    Lying to sitting edge of bed activity   Lying to sitting edge of bed assist level: Supervision/Verbal cueing     Care Tool Transfers Sit to stand transfer   Sit to stand assist level: Contact Guard/Touching assist    Chair/bed transfer   Chair/bed transfer assist level: Contact Guard/Touching assist     Physiological scientist transfer assist level: Contact Guard/Touching assist      Care Tool Locomotion Ambulation   Assist level: Contact Guard/Touching assist Assistive device: Walker-rolling Max distance: 200 ft  Walk  10 feet activity   Assist level: Contact Guard/Touching assist Assistive device: Walker-rolling  Walk 50 feet with 2 turns activity   Assist level: Contact Guard/Touching assist Assistive device: Walker-rolling  Walk 150 feet activity   Assist level: Contact Guard/Touching assist Assistive device: Walker-rolling  Walk 10 feet on uneven surfaces activity   Assist level: Contact Guard/Touching assist Assistive device: Walker-rolling  Stairs   Assist level: Contact Guard/Touching assist Stairs assistive device: 2 hand rails Max number of stairs: 8  Walk up/down 1 step activity   Walk up/down 1 step (curb) assist level: Contact Guard/Touching assist Walk up/down 1 step or curb assistive device: 2 hand rails    Walk up/down 4 steps activity Walk up/down 4 steps assist level: Contact Guard/Touching assist Walk up/down 4 steps assistive device: 2 hand rails  Walk up/down 12 steps activity Walk up/down 12 steps activity did not occur: Safety/medical concerns      Pick up small objects from floor Pick up small object from the floor (from standing position) activity did not occur: Safety/medical concerns      Wheelchair Will patient use wheelchair at discharge?: No     Wheelchair assist level: Supervision/Verbal cueing Max wheelchair distance: 150  Wheel 50 feet with 2 turns activity   Assist Level: Supervision/Verbal cueing  Wheel 150 feet activity   Assist Level: Supervision/Verbal cueing    Refer to Care Plan for Long Term Goals  SHORT TERM GOAL WEEK 1 PT Short Term Goal 1 (Week 1): =LTGs d/t ELOS  Recommendations for other services: None   Skilled Therapeutic Intervention Mobility Bed Mobility Bed Mobility: Supine to Sit Supine to Sit: Supervision/Verbal cueing Transfers Transfers: Sit to Stand;Stand Pivot Transfers Sit to Stand: Contact Guard/Touching assist Stand Pivot Transfers: Contact Guard/Touching assist Transfer (Assistive device): Rolling walker Locomotion   Gait Gait Distance (Feet): 200 Feet Assistive device: Rolling walker Gait Gait Pattern: Within Functional Limits Gait velocity: Reduced Stairs / Additional Locomotion Stairs: Yes Stairs Assistance: Contact Guard/Touching assist Stair Management Technique: Two rails Number of Stairs: 8 Height of Stairs: 6 Ramp: Nurse, mental health Mobility: Yes Wheelchair Assistance: Chartered loss adjuster: Both upper extremities Wheelchair Parts Management: Needs assistance Distance: 150 ft   Evaluation completed (see details above and below) with education on PT POC and goals and individual treatment initiated with focus on  functional mobility and endurance. pt received in bed and agreeable to therapy. Therapist retrieved and adjusted mobility equipment to appropriate heights. Gait x 200 ft with RW and CGA, pt reported being "winded." Pt navigated 6" steps x 8  with reciprocal pattern with CGA, pt again reported feeling winded but overall feeling well. Car transfer with RW and CGA. Pt navigated ramp and uneven surface with CGA and RW.  MMT and sensation testing performed as noted above. 30 sec Sit to stand with close supervision and no AD =8. Pt returned to room and opted to remain in w/c. Pt was left with all needs in reach and alarm active.   Discharge Criteria: Patient will be discharged from PT if patient refuses treatment 3 consecutive times without medical reason, if treatment goals not met, if there is a change in medical status, if patient makes no progress towards goals or if patient is discharged from hospital.  The above assessment, treatment plan, treatment alternatives and goals were discussed and mutually agreed upon: by patient  [name] 05/23/2021, 5:12 PM

## 2021-05-23 NOTE — Plan of Care (Signed)
  Problem: RH Balance Goal: LTG Patient will maintain dynamic standing with ADLs (OT) Description: LTG:  Patient will maintain dynamic standing balance with assist during activities of daily living (OT)  Flowsheets (Taken 05/23/2021 1234) LTG: Pt will maintain dynamic standing balance during ADLs with: Independent with assistive device   Problem: Sit to Stand Goal: LTG:  Patient will perform sit to stand in prep for activites of daily living with assistance level (OT) Description: LTG:  Patient will perform sit to stand in prep for activites of daily living with assistance level (OT) Flowsheets (Taken 05/23/2021 1234) LTG: PT will perform sit to stand in prep for activites of daily living with assistance level: Independent with assistive device   Problem: RH Grooming Goal: LTG Patient will perform grooming w/assist,cues/equip (OT) Description: LTG: Patient will perform grooming with assist, with/without cues using equipment (OT) Flowsheets (Taken 05/23/2021 1234) LTG: Pt will perform grooming with assistance level of: Independent with assistive device    Problem: RH Bathing Goal: LTG Patient will bathe all body parts with assist levels (OT) Description: LTG: Patient will bathe all body parts with assist levels (OT) Flowsheets (Taken 05/23/2021 1234) LTG: Pt will perform bathing with assistance level/cueing: Independent with assistive device    Problem: RH Dressing Goal: LTG Patient will perform upper body dressing (OT) Description: LTG Patient will perform upper body dressing with assist, with/without cues (OT). Flowsheets (Taken 05/23/2021 1234) LTG: Pt will perform upper body dressing with assistance level of: Independent with assistive device Goal: LTG Patient will perform lower body dressing w/assist (OT) Description: LTG: Patient will perform lower body dressing with assist, with/without cues in positioning using equipment (OT) Flowsheets (Taken 05/23/2021 1234) LTG: Pt will perform  lower body dressing with assistance level of: Independent with assistive device   Problem: RH Toileting Goal: LTG Patient will perform toileting task (3/3 steps) with assistance level (OT) Description: LTG: Patient will perform toileting task (3/3 steps) with assistance level (OT)  Flowsheets (Taken 05/23/2021 1234) LTG: Pt will perform toileting task (3/3 steps) with assistance level: Independent with assistive device   Problem: RH Toilet Transfers Goal: LTG Patient will perform toilet transfers w/assist (OT) Description: LTG: Patient will perform toilet transfers with assist, with/without cues using equipment (OT) Flowsheets (Taken 05/23/2021 1234) LTG: Pt will perform toilet transfers with assistance level of: Independent with assistive device   Problem: RH Tub/Shower Transfers Goal: LTG Patient will perform tub/shower transfers w/assist (OT) Description: LTG: Patient will perform tub/shower transfers with assist, with/without cues using equipment (OT) Flowsheets (Taken 05/23/2021 1234) LTG: Pt will perform tub/shower stall transfers with assistance level of: Independent with assistive device

## 2021-05-23 NOTE — Progress Notes (Signed)
[place] Individual Statement of Services  Patient Name:  COREN RYER  Date:  05/23/2021  Welcome to the [facility].  Our goal is to provide you with an individualized program based on your diagnosis and situation, designed to meet your specific needs.  With this comprehensive rehabilitation program, you will be expected to participate in at least 3 hours of rehabilitation therapies Monday-Friday, with modified therapy programming on the weekends.  Your rehabilitation program will include the following services:  Physical Therapy (PT), Occupational Therapy (OT), 24 hour per day rehabilitation nursing, Therapeutic Recreaction (TR), Care Coordinator, Rehabilitation Medicine, Nutrition Services, and Pharmacy Services  Weekly team conferences will be held on Tuesday to discuss your progress.  Your Inpatient Rehabilitation Care Coordinator will talk with you frequently to get your input and to update you on team discussions.  Team conferences with you and your family in attendance may also be held.  Expected length of stay: 5-7 days  Overall anticipated outcome: mod/I level  Depending on your progress and recovery, your program may change. Your Inpatient Rehabilitation Care Coordinator will coordinate services and will keep you informed of any changes. Your Inpatient Rehabilitation Care Coordinator's name and contact numbers are listed  below.  The following services may also be recommended but are not provided by the [facility] [place] will be made to provide these services after discharge if needed.  Arrangements include referral to agencies that provide these services.  Your insurance has been verified to be: Medicaid  Your primary doctor is:  Kathe Becton  Pertinent information will be shared with your doctor and your  insurance company.  Inpatient Rehabilitation Care Coordinator:  Ovidio Kin, [place] or Emilia Beck  Information discussed with and copy given to patient by: Elease Hashimoto, 05/23/2021, 1:03 PM

## 2021-05-23 NOTE — Progress Notes (Signed)
PROGRESS NOTE   Subjective/Complaints: No complaints today Had a good night Denies pain Discussed his labwork.   ROS: denies pain Objective:   No results found. Recent Labs    05/23/21 0639  WBC 7.4  HGB 13.7  HCT 41.5  PLT 413*   Recent Labs    05/22/21 0355 05/23/21 0639  NA 135 135  K 3.4* 4.3  CL 100 105  CO2 23 21*  GLUCOSE 142* 139*  BUN 31* 30*  CREATININE 1.96* 1.77*  CALCIUM 9.6 9.8    Intake/Output Summary (Last 24 hours) at 05/23/2021 0834 Last data filed at 05/23/2021 0800 Gross per 24 hour  Intake 600 ml  Output 300 ml  Net 300 ml        Physical Exam: Vital Signs Blood pressure (!) 105/59, pulse 94, temperature 98.9 F (37.2 C), temperature source Oral, resp. rate 18, height '5\' 8"'$  (1.727 m), weight (!) 142.7 kg, SpO2 97 %.  Gen: no distress, normal appearing HEENT: oral mucosa pink and moist, NCAT Cardio: Reg rate Chest: normal effort, tachypneic Abd: soft, non-distended Ext: no edema Psych: pleasant, normal affect Skin: intact Neurological:     Comments: Patient is alert.  Appears a bit fatigued.  Oriented x3 and follows commands.     Assessment/Plan: 1. Functional deficits which require 3+ hours per day of interdisciplinary therapy in a comprehensive inpatient rehab setting. Physiatrist is providing close team supervision and 24 hour management of active medical problems listed below. Physiatrist and rehab team continue to assess barriers to discharge/monitor patient progress toward functional and medical goals  Care Tool:  Bathing              Bathing assist       Upper Body Dressing/Undressing Upper body dressing        Upper body assist Assist Level: Minimal Assistance - Patient > 75%    Lower Body Dressing/Undressing Lower body dressing            Lower body assist       Toileting Toileting    Toileting assist Assist for toileting: Contact  Guard/Touching assist     Transfers Chair/bed transfer  Transfers assist           Locomotion Ambulation   Ambulation assist              Walk 10 feet activity   Assist           Walk 50 feet activity   Assist           Walk 150 feet activity   Assist           Walk 10 feet on uneven surface  activity   Assist           Wheelchair     Assist               Wheelchair 50 feet with 2 turns activity    Assist            Wheelchair 150 feet activity     Assist          Blood pressure (!) 105/59, pulse 94, temperature 98.9 F (37.2  C), temperature source Oral, resp. rate 18, height '5\' 8"'$  (1.727 m), weight (!) 142.7 kg, SpO2 97 %.  Medical Problem List and Plan: 1.  Debility secondary to acute on chronic systolic congestive heart failure/cardiogenic and ultimate plan for LVAD             -patient may shower             -ELOS/Goals: 5-7 days modI             -Initial CIR evaluations today.  2.  Antithrombotics: -DVT/anticoagulation: [employer]: Other (comment)             -antiplatelet therapy: N/A 3. Pain Management: Tramadol as needed 4. Depression: Continue Zoloft 25 mg daily, 5. Neuropsych: This patient is capable of making decisions on his own behalf. 6. Skin/Wound Care: Routine skin checks 7. Fluids/Electrolytes/Nutrition: Routine in and outs with follow-up chemistries 8.  PAF.  Continue Eliquis.  Amiodarone 200 mg twice daily.  Patient underwent emergent TEE guided DCCV 7/27.  Cardiac rate controlled. 9.  Acute hypoxemic respiratory failure.  Resolved.  Antibiotic therapy completed. 10.  AKI on CKD stage III.  Follow-up chemistries. Discussed that creatinine is improving.  11.  OSA.  CPAP 12.  Gout.  Prednisone colchicine completed.  Conitnue allopurinol 13.  Diabetes mellitus.  Continue Jardiance 10 mg daily, NovoLog 6 units 3 times daily, Levemir 50 units twice daily.  Diabetic teaching.   14.  Hypertension.  Currently hypotensive, decrease Cozaar to 12.'5mg'$ . Monitor with increased mobility 15.  Acute exacerbation CHF.  Aldactone 25 mg daily, Demadex 40 mg Monday Wednesday Friday.  Monitor for any signs of fluid overload 16.  Obesity.  BMI 47.  Dietary follow-up 17. Insomnia: continue trazodone '100mg'$  HS 18. Anxiety: continue Xanax 0.'5mg'$  BID 19. Disposition: HFU scheduled     LOS: 1 days A FACE TO FACE EVALUATION WAS PERFORMED  Jeffrey [name] Jeffrey [name] 05/23/2021, 8:34 AM

## 2021-05-23 NOTE — Progress Notes (Signed)
Inpatient Rehabilitation  Patient information reviewed and entered into eRehab system by Quinn Quam Saket Hellstrom, OTR/L.   Information including medical coding, functional ability and quality indicators will be reviewed and updated through discharge.    

## 2021-05-23 NOTE — IPOC Note (Addendum)
Overall Plan of Care [facility]) Patient Details Name: Jeffrey [name] MRN: [id number] DOB: [birth year]  Admitting Diagnosis: <principal problem not specified>  Hospital Problems: Active Problems:   Cardiogenic shock ([place])     Functional Problem List: Nursing Bladder, Endurance, Pain, Safety, Bowel, Medication Management  PT Motor, Safety, Endurance  OT Balance, Motor, Pain, Safety, Skin Integrity  SLP    TR         Basic ADL's: OT Bathing, Grooming, Dressing, Toileting     Advanced  ADL's: OT Simple Meal Preparation     Transfers: PT Bed Mobility, Bed to Chair, Car, Furniture, Floor  OT Toilet, Tub/Shower     Locomotion: PT Ambulation, Stairs     Additional Impairments: OT None  SLP        TR      Anticipated Outcomes Item Anticipated Outcome  Self Feeding No goal  Swallowing      Basic self-care  Mod I  Toileting  Mod I   Bathroom Transfers Mod I  Bowel/Bladder  manage bowel w mod I and bladder w min  Transfers  mod I  Locomotion  Mod I gait with LRAD  Communication     Cognition     Pain  at or below level 4  Safety/Judgment  maintain w cues   Therapy Plan: PT Intensity: Minimum of 1-2 x/day ,45 to 90 minutes PT Frequency: 5 out of 7 days PT Duration Estimated Length of Stay: 7-10 OT Intensity: Minimum of 1-2 x/day, 45 to 90 minutes OT Frequency: 5 out of 7 days OT Duration/Estimated Length of Stay: 5-7 days     Due to the current state of emergency, patients may not be receiving their 3-hours of Medicare-mandated therapy.   Team Interventions: Nursing Interventions Bladder Management, Disease Management/Prevention, Medication Management, Discharge Planning, Pain Management, Bowel Management, Patient/Family Education  PT interventions Ambulation/gait training, Community reintegration, Discharge planning, Disease management/prevention, DME/adaptive equipment instruction, Functional mobility training, Neuromuscular re-education, Pain management,  Patient/family education, Psychosocial support, Stair training, Therapeutic Activities, Therapeutic Exercise, UE/LE Strength taining/ROM, UE/LE Coordination activities, Wheelchair propulsion/positioning  OT Interventions Training and development officer, DME/adaptive equipment instruction, Patient/family education, Therapeutic Activities, Cognitive remediation/compensation, Psychosocial support, Therapeutic Exercise, UE/LE Strength taining/ROM, Self Care/advanced ADL retraining, Functional mobility training, Community reintegration, Discharge planning, Skin care/wound managment, UE/LE Coordination activities, Pain management, Disease mangement/prevention  SLP Interventions    TR Interventions    SW/CM Interventions Discharge Planning, Psychosocial Support, Patient/Family Education   Barriers to Discharge MD  Medical stability  Nursing Decreased caregiver support, Home environment access/layout, Weight 1 level 6 ste w spouse  (works) bil rails ramped back entry; cousin/sister to assist  PT Inaccessible home environment, Home environment access/layout, Decreased caregiver support    OT [employer]    SLP      SW Insurance for SNF coverage     Team Discharge Planning: Destination: PT-Home ,OT-   , SLP-  Projected Follow-up: PT- , OT-  Home health OT, SLP-  Projected Equipment Needs: PT-Rolling walker with 5" wheels, OT- To be determined, Tub/shower seat, SLP-  Equipment Details: PT-Pt could wean off RW prior to d/c, OT-  Patient/family involved in discharge planning: PT- Patient,  OT-Patient, SLP-   MD ELOS: 5-7 days modI Medical Rehab Prognosis:  Excellent Assessment: Jeffrey [name] is a 45 year old man admitted to CIR with debility secondary to acute on chronic systolic congestive heart failure/cardiogenic and ultimate plan for LVAD. Medications are being managed, and labs and vitals are being monitored regularly.  See Team Conference Notes for weekly updates to the plan of care

## 2021-05-23 NOTE — Progress Notes (Addendum)
Advanced Heart Failure Rounding Note  PCP-Cardiologist: Glori Bickers, MD   Subjective:    Went to CIR yesterday. Patient seen during rehab session today.   No dyspnea. Occasional lightheadedness after medications.    Losartan held last night d/t hypotension. SBP 100s this am.  Creatinine now trending down, 1.58 > 1.96 > 1.77.   ? Weight gain overnight, patient reports weighed in bed     Objective:   Weight Range: (!) 142.7 kg Body mass index is 47.83 kg/m.   Vital Signs:   Temp:  [98.5 F (36.9 C)-98.9 F (37.2 C)] 98.9 F (37.2 C) (08/19 0521) Pulse Rate:  [92-101] 94 (08/19 0802) Resp:  [18-19] 18 (08/19 0521) BP: (94-108)/(59-95) 105/59 (08/19 0802) SpO2:  [96 %-99 %] 97 % (08/19 0521) Weight:  [140 kg-142.7 kg] 142.7 kg (08/19 0500) Last BM Date: 05/22/21  Weight change: Filed Weights   05/22/21 1704 05/22/21 1730 05/23/21 0500  Weight: (!) 140 kg (!) 140 kg (!) 142.7 kg    Intake/Output:   Intake/Output Summary (Last 24 hours) at 05/23/2021 0830 Last data filed at 05/23/2021 0800 Gross per 24 hour  Intake 600 ml  Output 300 ml  Net 300 ml      Physical Exam    General:  Well appearing. No resp difficulty HEENT: Normal Neck: Supple. No JVD sitting up in chair. Carotids 2+ bilat; no bruits. No lymphadenopathy or thyromegaly appreciated. Cor: PMI nondisplaced. Regular rate & rhythm. No rubs, gallops or murmurs. Lungs: Clear Abdomen: Soft, nontender, nondistended. No hepatosplenomegaly. No bruits or masses. Good bowel sounds. Extremities: No cyanosis, clubbing, rash, edema Neuro: Alert & orientedx3, cranial nerves grossly intact. moves all 4 extremities w/o difficulty. Affect pleasant   Telemetry   No Tele at CIR  EKG    No new ECG for review  Labs    CBC Recent Labs    05/23/21 0639  WBC 7.4  NEUTROABS 4.1  HGB 13.7  HCT 41.5  MCV 82.2  PLT 703*   Basic Metabolic Panel Recent Labs    05/22/21 0355 05/23/21 0639  NA  135 135  K 3.4* 4.3  CL 100 105  CO2 23 21*  GLUCOSE 142* 139*  BUN 31* 30*  CREATININE 1.96* 1.77*  CALCIUM 9.6 9.8   Liver Function Tests Recent Labs    05/23/21 0639  AST 19  ALT 18  ALKPHOS 62  BILITOT 0.7  PROT 7.0  ALBUMIN 2.7*   No results for input(s): LIPASE, AMYLASE in the last 72 hours. Cardiac Enzymes No results for input(s): CKTOTAL, CKMB, CKMBINDEX, TROPONINI in the last 72 hours.  BNP: BNP (last 3 results) Recent Labs    11/21/20 2222 04/29/21 1247  BNP 65.0 1,407.7*    ProBNP (last 3 results) No results for input(s): PROBNP in the last 8760 hours.   D-Dimer No results for input(s): DDIMER in the last 72 hours. Hemoglobin A1C No results for input(s): HGBA1C in the last 72 hours. Fasting Lipid Panel No results for input(s): CHOL, HDL, LDLCALC, TRIG, CHOLHDL, LDLDIRECT in the last 72 hours. Thyroid Function Tests No results for input(s): TSH, T4TOTAL, T3FREE, THYROIDAB in the last 72 hours.  Invalid input(s): FREET3  Other results:   Imaging    No results found.   Medications:     Scheduled Medications:  allopurinol  100 mg Oral Daily   ALPRAZolam  0.5 mg Oral BID   amiodarone  200 mg Oral BID   apixaban  5 mg Oral  BID   docusate sodium  100 mg Oral BID   empagliflozin  10 mg Oral Daily   insulin aspart  0-20 Units Subcutaneous TID WC   insulin aspart  6 Units Subcutaneous TID WC   insulin detemir  50 Units Subcutaneous BID   losartan  25 mg Oral QHS   pantoprazole  40 mg Oral Daily   polyethylene glycol  17 g Oral Daily   sertraline  25 mg Oral Daily   spironolactone  25 mg Oral Daily   traZODone  100 mg Oral QHS    Infusions:   PRN Medications: acetaminophen, nitroGLYCERIN, sorbitol, traMADol    Assessment/Plan  1. 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  Echo in 9/21 with EF 20-25%.  Echo this admission with EF < 20%, severe LV dilation, moderate RV  dysfunction.   - s/p S-ICD - 7/27: intubated with pulmonary edema and required addition of NE.  - Dobutamine weaned off on 8/3 - Appears euvolemic. Off Torsemide d/t low volume status and worsening creatinine. Scr now trending down. - Bidil stopped 8/12 due to hypotension - Losartan held last night d/t hypotension. Dose reduced to 12.5 mg daily. SBP 100s this am. - Entresto stopped 08/16 d/t hypotension - Continue spiro 25 mg daily - Continue jardiance 10 mg daily - No BB for now d/t recent shock - VAD work-up started.    2 Atrial fibrillation: Paroxysmal - Suspect AF with RVR drove initial CHF exacerbation. Emergent TEE-guided DCCV 7/27 due to inability to control HR - Recurrent AF on 8/5. s/p DC-CV on 05/10/21. Remains in NSR. - Seen by EP. Not a candidate for ablation due to body habitus  (could consider AVN ablation and CRT, if needed)  - Amiodarone initially stopped due to concern for toxicity, but resumed after reversion to rapid afib on 8/5. IV amio off, now on po 200 mg BID - Continue Eliquis.  - Keep K> 4 Mg > 2.   3. Acute hypoxemic respiratory failure - Resp culture + for rare S. Aureus (MSSA)  - Treated with linezolid and cefepime. Cefepime switched to Ancef and Linezolid discontinued on 8/3. Ancef stopped 8/7 - Initial concern for amiodarone toxicity -- amio stopped and steroids started, but steroids stopped the next morning on 8/4 -- per CCM team, more likely atelectasis driving hypoxia.  Deferred management to them. CT chest not c/w amio toxicity despite ESR 128 - Extubated on 8/4, initially to BiPAP. 02 sats stable on RA.   4. AKI on CKD stage 3  - Has solitary kidney.   - Suspect cardiorenal syndrome/low output with AF/RVR in setting of cardiomyopathy.   - Baseline creatinine is 1.5-1.7, Creatinine peaked at 4.65. 1.60 -> 1.77 -> 1.96 -> 1.77. - Holding Torsemide - Urinary obstruction likely played a role. Urology placed foley catheter with improvement in UOP. No  residual retention. - Avoid hypotension. Off bidil. BP better after switching Entresto to Losartan.   5. OSA - CPAP    6. Hypokalemia - Resolved - Continue monitoring.   7. Acute gout - Received prednisone and colchicine -> resolved - Continue allopurinol   8. DM2. Poorly controlled - Jardiance - continue SSI - Hgb A1c 9.4 - DM consulted, appreciate input   9. Debility - HF much improved. Co-ox stable off inotropes. Remains in NSR. Main issue is severe debility.  - Remains quite weak and unable to ambulate independently. He is motivated to improve and is  now at [facility]. Hopefully he will progress favorably which will expedite our chances of getting him ready for VAD in a reasonable amount of time.      Length of Stay: 1  FINCH, LINDSAY N, PA-C  05/23/2021, 8:30 AM  Advanced Heart Failure Team Pager [phone] (M-F; 7a - 5p)  Please contact [facility] for night-coverage after hours (5p -7a ) and weekends on [email or web address]  Patient seen and examined with the above-signed Advanced Practice Provider and/or Housestaff. I personally reviewed laboratory data, imaging studies and relevant notes. I independently examined the patient and formulated the important aspects of the plan. I have edited the note to reflect any of my changes or salient points. I have personally discussed the plan with the patient and/or family.  Losartan stopped last night due to SBP in 90. Denies C, SOB, orthopnea or PND. Scr improved with holding torsemide   General:  Well appearing. No resp difficulty HEENT: normal Neck: supple. no JVD. Carotids 2+ bilat; no bruits. No lymphadenopathy or thryomegaly appreciated. Cor: PMI nondisplaced. Regular rate & rhythm. No rubs, gallops or murmurs. Lungs: clear Abdomen: obese soft, nontender, nondistended. No hepatosplenomegaly. No bruits or masses. Good bowel sounds. Extremities: no cyanosis, clubbing, rash, edema Neuro: alert & orientedx3, cranial nerves grossly intact.  moves all 4 extremities w/o difficulty. Affect pleasant  Overall stable from HF perspective. Agree with cutting back losartan for now. Hold torsemide today. Can restart as needed.   Suspect he will need VAD in next few months. Watch renal function closely. Appreciate CIR assistance in helping him get stronger.   HF team wile see again on Monday. Please call General Cardiology on-call pager for any questions over the weekend.   Glori Bickers, MD  9:41 AM

## 2021-05-23 NOTE — Progress Notes (Signed)
Inpatient Rehabilitation Care Coordinator Assessment and Plan Patient Details  Name: Jeffrey [name] MRN: [id number] Date of Birth: Oct 15, 1975  Today's Date: 05/23/2021  Hospital Problems: Active Problems:   Cardiogenic shock [facility])  Past Medical History:  Past Medical History:  Diagnosis Date   Anxiety    Aspirin allergy    Childhood asthma    Chronic systolic CHF (congestive heart failure) ([place])    a. EF 20-25% in 2012. b. EF 45-50% in 10/2011 with nonischemic nuc - presumed NICM. c. 12/2014 Echo: Sev depressed LV fxn, sev dil LV, mild LVH, mild MR, sev dil LA, mildly reduced RV fxn.   CKD (chronic kidney disease) stage 2, GFR 60-89 ml/min    H/O vasectomy 12/2019   High cholesterol    Hypertension    Morbid obesity ([place])    Nephrolithiasis    OSA on CPAP    Paroxysmal atrial fibrillation ([place])    Presumed NICM    a. 04/2014 Myoview: EF 26%, glob HK, sev glob HK, ? prior infarct;  b. Never cathed 2/2 CKD.   Renal cell carcinoma ([place])    a. s/p Rt robotic assisted partial converted to radical nephrectomy on 01/2013.   Troponin level elevated    a. 04/2014, 12/2014: felt due to CHF.   Type II diabetes mellitus ([place])    Ventricular tachycardia ([place])    a. appropriate ICD therapy 12/2017   Past Surgical History:  Past Surgical History:  Procedure Laterality Date   APPENDECTOMY  07/2004   BIOPSY  02/10/2021   Procedure: BIOPSY;  Surgeon: Gatha Mayer, MD;  Location: [facility] ENDOSCOPY;  Service: Endoscopy;;   CARDIAC CATHETERIZATION N/A 05/17/2015   Procedure: Right/Left Heart Cath and Coronary Angiography;  Surgeon: Jolaine Artist, MD;  Location: [place] [facility];  Service: Cardiovascular;  Laterality: N/A;   CARDIOVERSION N/A 04/30/2021   Procedure: CARDIOVERSION;  Surgeon: Larey Dresser, MD;  Location: [facility] ENDOSCOPY;  Service: Cardiovascular;  Laterality: N/A;   COLONOSCOPY WITH PROPOFOL N/A 02/10/2021   Procedure: COLONOSCOPY WITH PROPOFOL;  Surgeon: Gatha Mayer, MD;   Location: [facility] ENDOSCOPY;  Service: Endoscopy;  Laterality: N/A;   EP IMPLANTABLE DEVICE N/A 05/17/2015   Procedure: SubQ ICD Implant;  Surgeon: Will Meredith Leeds, MD;  Location: [place] [facility] LAB;  Service: Cardiovascular;  Laterality: N/A;   ESOPHAGOGASTRODUODENOSCOPY (EGD) WITH PROPOFOL N/A 02/10/2021   Procedure: ESOPHAGOGASTRODUODENOSCOPY (EGD) WITH PROPOFOL;  Surgeon: Gatha Mayer, MD;  Location: [facility];  Service: Endoscopy;  Laterality: N/A;   RIGHT HEART CATH N/A 05/13/2021   Procedure: RIGHT HEART CATH;  Surgeon: Jolaine Artist, MD;  Location: [place] [facility];  Service: Cardiovascular;  Laterality: N/A;   ROBOTIC ASSISTED LAPAROSCOPIC LYSIS OF ADHESION  01/13/2013   Procedure: ROBOTIC ASSISTED LAPAROSCOPIC LYSIS OF ADHESION EXTENSIVE;  Surgeon: Alexis Frock, MD;  Location: [facility] ORS;  Service: Urology;;   ROBOTIC ASSITED PARTIAL NEPHRECTOMY Right 01/13/2013   Procedure: ROBOTIC ASSITED PARTIAL NEPHRECTOMY CONVERTED TO ROBOTIC ASSISTED RIGHT RADICAL NEPHRECTOMY;  Surgeon: Alexis Frock, MD;  Location: [facility] OR[facility];  Service: Urology;  Laterality: Right;   SUBQ ICD CHANGEOUT N/A 05/16/2020   Procedure: SUBQ ICD CHANGEOUT;  Surgeon: [name], MD;  Location: [place] [facility];  Service: Cardiovascular;  Laterality: N/A;   SUBQ ICD CHANGEOUT N/A 05/15/2020   Procedure: SUBQ ICD CHANGEOUT;  Surgeon: [name], MD;  Location: [place] [facility];  Service: Cardiovascular;  Laterality: N/A;   TEE WITHOUT CARDIOVERSION N/A 04/30/2021  Procedure: TRANSESOPHAGEAL ECHOCARDIOGRAM (TEE);  Surgeon: Larey Dresser, MD;  Location: [facility] ENDOSCOPY;  Service: Cardiovascular;  Laterality: N/A;   VASECTOMY     Social History:  reports that he has been smoking cigarettes. He has a 5.50 pack-year smoking history. He has never used smokeless tobacco. He reports current alcohol use. He reports current drug use. Frequency: 14.00 times per week. Drug: Marijuana.  Family / Support  Systems Marital Status: Married Patient Roles: Spouse, Parent, Caregiver Spouse/Significant Other: [name] [id number] Children: 28 children all but younger two -41 and 64 yo live with their mothers. Other Supports: Faith-mom [phone] Anticipated Caregiver: Wife and Mom Ability/Limitations of Caregiver: Wife is a Freight forwarder and Mom will check on but pt will not have 24/7 care at [place] Availability: Evenings only Family Dynamics: Close knit with family and extended family. Pt is anxious to go home snce being in the hospital for an extended period of time. Pt has good social supports  Social History Preferred language: English Religion: Muslim Cultural Background: No issues Education: HS Read: Yes Write: Yes Employment Status: Unemployed Date Retired/Disabled/Unemployed: Has applied for SSD/SSI-pending Legal History/Current Legal Issues: No issues Guardian/Conservator: Noe-according to MD pt is capable of making his own decisions while here. Pt is doing quite well and will be a short length of stay   Abuse/Neglect Abuse/Neglect Assessment Can Be Completed: Yes Physical Abuse: Denies Verbal Abuse: Denies Sexual Abuse: Denies Exploitation of patient/patient's resources: Denies Self-Neglect: Denies  Emotional Status Pt's affect, behavior and adjustment status: Pt is motivated to do well and get home. He is already ambulating in the hallway. He is hopeful he will be able to go home soon. He feels he will be at a level he will be able to take care of himself, he usually cares for their 54 yo but wife is looking into daycare for him Recent Psychosocial Issues: other health issues Psychiatric History: No history deferred depression appears to be coping appropriately and verbalizes his concerns and questions. Feels positive due to will be going home soon  Patient / Family Perceptions, Expectations & Goals Pt/Family understanding of illness & functional limitations: Pt and  wife can explain his health issues and treatment plan going forward. Both are very encouraged at how well he is doing and the progress he has made already. Both continue to hope he will progress and be medically stable for discharge soon Premorbid pt/family roles/activities: husband, father, son, cousin, etc Anticipated changes in roles/activities/participation: resume Pt/family expectations/goals: Pt states: " I hope to go home soon, I have been here for five weeks and am ready to go home."  Wife states: " I will be ready to have him home, it's been a long road."  [employer]: Other (Comment) ([facility] follows) Premorbid Home Care/DME Agencies: None Transportation available at discharge: Wife and Mom Resource referrals recommended: Neuropsychology  Discharge Planning Living Arrangements: Spouse/significant other, Children Support Systems: Spouse/significant other, Children, Parent, Other relatives, Friends/neighbors Type of Residence: Private residence Insurance Resources: [employer] (specify county) Museum/gallery curator Resources: Family Support Financial Screen Referred: Previously completed Living Expenses: Education officer, community Management: Patient, Spouse Does the patient have any problems obtaining your medications?: No Home Management: Both pt and wife Patient/Family Preliminary Plans: Return home with wfie and two young children. His wife works and is back at school, his Mom can stop by and check on him while she is working. Wife pursuing day care for 50 yo until pt can resume caring or him at home. Will await  team evaluations and work on discharge needs. Care Coordinator Barriers to Discharge: Insurance for SNF coverage Care Coordinator Anticipated Follow Up Needs: HH/OP  Clinical Impression Pleasant gentleman who is motivated to do well and only be here a short time and get home. He has been here for 5 weeks and is tired of being in a hospital. Will work on discharge needs-pt  needs to be mod/I due to times he will be alone at home  Elease Hashimoto 05/23/2021, 1:01 PM

## 2021-05-24 DIAGNOSIS — R5381 Other malaise: Secondary | ICD-10-CM | POA: Diagnosis not present

## 2021-05-24 LAB — GLUCOSE, CAPILLARY
Glucose-Capillary: 126 mg/dL — ABNORMAL HIGH (ref 70–99)
Glucose-Capillary: 133 mg/dL — ABNORMAL HIGH (ref 70–99)
Glucose-Capillary: 128 mg/dL — ABNORMAL HIGH (ref 70–99)
Glucose-Capillary: 155 mg/dL — ABNORMAL HIGH (ref 70–99)

## 2021-05-24 MED ORDER — NYSTATIN 100000 UNIT/GM EX POWD
Freq: Two times a day (BID) | CUTANEOUS | Status: DC
  Filled 2021-05-24: qty 15

## 2021-05-24 MED ORDER — GUAIFENESIN-DM 100-10 MG/5ML PO SYRP
5.0000 mL | ORAL_SOLUTION | ORAL | Status: DC | PRN
  Filled 2021-05-24: qty 5

## 2021-05-24 NOTE — Progress Notes (Signed)
Patient complains of throbbing headache, meds given. pointed out red round pruritic rashes in his midchest area which appeared just today as per patient. Notified Dr, he will examine the patient in the morning.

## 2021-05-24 NOTE — Progress Notes (Signed)
Patient on home CPAP at this time.

## 2021-05-24 NOTE — Progress Notes (Signed)
Occupational Therapy Session Note  Patient Details  Name: Jeffrey [name] MRN: [id number] Date of Birth: 24-Jul-1976  Today's Date: 05/25/2021 OT Individual Time: 1300-1341 OT Individual Time Calculation (min): 41 min  and Today's Date: 05/25/2021 OT Group Time: 1115-1200 OT Group Time Calculation (min): 45 min  Short Term Goals: Week 1:  OT Short Term Goal 1 (Week 1): STGs=LTGs due to ELOS  Skilled Therapeutic Interventions/Progress Updates:    Pt engaged in therapeutic w/c level dance group focusing on patient choice, UE/LE strengthening, salience, activity tolerance, and social participation. Pt was guided through various dance-based exercises involving UEs/LEs and trunk. All music was selected by group members. Emphasis placed on activity tolerance and general strengthening. Pt participated well while seated, dynamically moving UEs and LEs together in beat to music. He declined standing though was provided with the opportunity. He was returned to the room by NT.   2nd Session 1:1 tx (41 min) Pt greeted in the w/c, knee pain manageable without additional interventions. OT printed him out an energy conservation handout and we discussed using stated principles during ADL/IADL participation at home. Pt receptive to education + collaborative during discussion. Also printed him out a HEP pertaining to daily stretching/strengthening of UB. Emphasis placed on offsetting muscular imbalances resulting from forward flexed posture/reliance on RW for mobility. Pt verbalized understanding and we reviewed stretches/exercises (using blue tband) from packet together. Pt reported having some trouble sleeping here at the hospital due to HAs. Advised raising HOB when preparing for sleep and OT also provided him with lavender aromatherapy to promote relaxation. Pt feels ready for d/c home this week. Left him with all needs within reach.    Therapy Documentation Precautions:  Precautions Precautions:  Fall Precaution Comments: B knee pain, premorbid lightheadedness (pt thinks related to meds) Restrictions Weight Bearing Restrictions: No   Pain: no s/s pain during tx   ADL: ADL Eating: Not assessed Grooming: Supervision/safety Where Assessed-Grooming: Standing at sink Upper Body Bathing: Setup Where Assessed-Upper Body Bathing: Shower Lower Body Bathing: Supervision/safety Where Assessed-Lower Body Bathing: Shower Upper Body Dressing: Contact guard Where Assessed-Upper Body Dressing: Other (Comment) (standing) Lower Body Dressing: Minimal assistance Where Assessed-Lower Body Dressing: Edge of bed, Other (Comment) (sit<stand from TTB) Toileting: Supervision/safety Where Assessed-Toileting: Glass blower/designer: Therapist, music Method: Ambulating Dietitian) Science writer: Energy manager: Curator Method: Ambulating (RW) Youth worker: Radio broadcast assistant, Grab bars   Therapy/Group: Individual Therapy and Group Therapy  Rylen Swindler A Adrieanna Boteler 05/25/2021, 1:42 PM

## 2021-05-24 NOTE — Plan of Care (Signed)
  Problem: RH BOWEL ELIMINATION Goal: RH STG MANAGE BOWEL WITH ASSISTANCE Description: STG Manage Bowel with mod I Assistance. Outcome: Progressing Goal: RH STG MANAGE BOWEL W/MEDICATION W/ASSISTANCE Description: STG Manage Bowel with Medication with mod I Assistance. Outcome: Progressing   Problem: RH BLADDER ELIMINATION Goal: RH STG MANAGE BLADDER WITH ASSISTANCE Description: STG Manage Bladder With  min Assistance Outcome: Progressing   Problem: RH SAFETY Goal: RH STG ADHERE TO SAFETY PRECAUTIONS W/ASSISTANCE/DEVICE Description: STG Adhere to Safety Precautions With cues Assistance/Device. Outcome: Progressing   Problem: RH PAIN MANAGEMENT Goal: RH STG PAIN MANAGED AT OR BELOW PT'S PAIN GOAL Description: At or below level 4 Outcome: Progressing   Problem: RH KNOWLEDGE DEFICIT GENERAL Goal: RH STG INCREASE KNOWLEDGE OF SELF CARE AFTER HOSPITALIZATION Description: Patient will be able to manage self care at discharge using handouts and educational resources independently Outcome: Progressing   Problem: Education: Goal: Ability to demonstrate management of disease process will improve Outcome: Progressing Goal: Individualized Educational Video(s) Outcome: Progressing   Problem: Activity: Goal: Capacity to carry out activities will improve Outcome: Progressing   Problem: Cardiac: Goal: Ability to achieve and maintain adequate cardiopulmonary perfusion will improve Outcome: Progressing   Problem: Consults Goal: RH GENERAL PATIENT EDUCATION Description: See Patient Education module for education specifics. Outcome: Progressing

## 2021-05-24 NOTE — Progress Notes (Signed)
PROGRESS NOTE   Subjective/Complaints: No headache this morning Concerned about rash on chest.  He has no history of rash or skin issues.  This is nonpainful and not itching.  He does sweat a lot in that area.  He has been in the hospital for 5 weeks.  ROS: denies pain Objective:   No results found. Recent Labs    05/23/21 0639  WBC 7.4  HGB 13.7  HCT 41.5  PLT 413*    Recent Labs    05/22/21 0355 05/23/21 0639  NA 135 135  K 3.4* 4.3  CL 100 105  CO2 23 21*  GLUCOSE 142* 139*  BUN 31* 30*  CREATININE 1.96* 1.77*  CALCIUM 9.6 9.8     Intake/Output Summary (Last 24 hours) at 05/24/2021 0746 Last data filed at 05/24/2021 0515 Gross per 24 hour  Intake 480 ml  Output 2050 ml  Net -1570 ml         Physical Exam: Vital Signs Blood pressure 99/87, pulse 88, temperature 98.1 F (36.7 C), resp. rate 14, height '5\' 8"'$  (1.727 m), weight (!) 142.7 kg, SpO2 93 %.   General: No acute distress Mood and affect are appropriate Heart: Regular rate and rhythm no rubs murmurs or extra sounds Lungs: Clear to auscultation, breathing unlabored, no rales or wheezes Abdomen: Positive bowel sounds, soft nontender to palpation, nondistended Extremities: No clubbing, cyanosis, or edema  Skin: miliaria pustules on chest , 2 pustules on back  Neurological:     Comments: Patient is alert.  Appears a bit fatigued.  Oriented x3 and follows commands.     Assessment/Plan: 1. Functional deficits which require 3+ hours per day of interdisciplinary therapy in a comprehensive inpatient rehab setting. Physiatrist is providing close team supervision and 24 hour management of active medical problems listed below. Physiatrist and rehab team continue to assess barriers to discharge/monitor patient progress toward functional and medical goals  Care Tool:  Bathing    Body parts bathed by patient: Right arm, Left arm, Chest, Front  perineal area, Abdomen, Buttocks, Right upper leg, Left upper leg, Right lower leg, Left lower leg, Face         Bathing assist Assist Level: Supervision/Verbal cueing     Upper Body Dressing/Undressing Upper body dressing   What is the patient wearing?: Pull over shirt    Upper body assist Assist Level: Contact Guard/Touching assist (standing)    Lower Body Dressing/Undressing Lower body dressing      What is the patient wearing?: Pants     Lower body assist Assist for lower body dressing: Supervision/Verbal cueing     Toileting Toileting    Toileting assist Assist for toileting: Supervision/Verbal cueing     Transfers Chair/bed transfer  Transfers assist     Chair/bed transfer assist level: Contact Guard/Touching assist     Locomotion Ambulation   Ambulation assist      Assist level: Contact Guard/Touching assist Assistive device: Walker-rolling Max distance: 200 ft   Walk 10 feet activity   Assist     Assist level: Contact Guard/Touching assist Assistive device: Walker-rolling   Walk 50 feet activity   Assist    Assist level:  Contact Guard/Touching assist Assistive device: Walker-rolling    Walk 150 feet activity   Assist    Assist level: Contact Guard/Touching assist Assistive device: Walker-rolling    Walk 10 feet on uneven surface  activity   Assist     Assist level: Contact Guard/Touching assist Assistive device: Aeronautical engineer Will patient use wheelchair at discharge?: No      Wheelchair assist level: Supervision/Verbal cueing Max wheelchair distance: 150    Wheelchair 50 feet with 2 turns activity    Assist        Assist Level: Supervision/Verbal cueing   Wheelchair 150 feet activity     Assist      Assist Level: Supervision/Verbal cueing   Blood pressure 99/87, pulse 88, temperature 98.1 F (36.7 C), resp. rate 14, height '5\' 8"'$  (1.727 m), weight (!) 142.7 kg,  SpO2 93 %.  Medical Problem List and Plan: 1.  Debility secondary to acute on chronic systolic congestive heart failure/cardiogenic and ultimate plan for LVAD             -patient may shower             -ELOS/Goals: 5-7 days modI             -Initial CIR evaluations today.  2.  Antithrombotics: -DVT/anticoagulation: Eliquis [employer]: Other (comment)             -antiplatelet therapy: N/A 3. Pain Management: Tramadol as needed 4. Depression: Continue Zoloft 25 mg daily, 5. Neuropsych: This patient is capable of making decisions on his own behalf. 6. Skin/Wound Care: Routine skin checks, miliaria, control room temp sweating , apply powder 7. Fluids/Electrolytes/Nutrition: Routine in and outs with follow-up chemistries 8.  PAF.  Continue Eliquis.  Amiodarone 200 mg twice daily.  Patient underwent emergent TEE guided DCCV 7/27.  Cardiac rate controlled. 9.  Acute hypoxemic respiratory failure.  Resolved.  Antibiotic therapy completed. 10.  AKI on CKD stage III.  Follow-up chemistries. Discussed that creatinine is improving.  11.  OSA.  CPAP 12.  Gout.  Prednisone colchicine completed.  Conitnue allopurinol 13.  Diabetes mellitus.  Continue Jardiance 10 mg daily, NovoLog 6 units 3 times daily, Levemir 50 units twice daily.  Diabetic teaching.  14.  Hypertension.  Currently hypotensive, decrease Cozaar to 12.'5mg'$ . Monitor with increased mobility 15.  Acute exacerbation CHF.  Aldactone 25 mg daily, Demadex 40 mg Monday Wednesday Friday.  No signs of fluid overload, tolerating therapy well 16.  Obesity.  BMI 47.  Dietary follow-up 17. Insomnia: continue trazodone '100mg'$  HS 18. Anxiety: continue Xanax 0.'5mg'$  BID 19. Disposition: HFU scheduled     LOS: 2 days A FACE TO FACE EVALUATION WAS PERFORMED  Charlett Blake 05/24/2021, 7:46 AM

## 2021-05-25 DIAGNOSIS — R5381 Other malaise: Secondary | ICD-10-CM | POA: Diagnosis not present

## 2021-05-25 LAB — GLUCOSE, CAPILLARY
Glucose-Capillary: 119 mg/dL — ABNORMAL HIGH (ref 70–99)
Glucose-Capillary: 138 mg/dL — ABNORMAL HIGH (ref 70–99)
Glucose-Capillary: 101 mg/dL — ABNORMAL HIGH (ref 70–99)
Glucose-Capillary: 100 mg/dL — ABNORMAL HIGH (ref 70–99)

## 2021-05-25 NOTE — Plan of Care (Signed)
  Problem: RH BOWEL ELIMINATION Goal: RH STG MANAGE BOWEL WITH ASSISTANCE Description: STG Manage Bowel with mod I Assistance. Outcome: Progressing Goal: RH STG MANAGE BOWEL W/MEDICATION W/ASSISTANCE Description: STG Manage Bowel with Medication with mod I Assistance. Outcome: Progressing   Problem: RH BLADDER ELIMINATION Goal: RH STG MANAGE BLADDER WITH ASSISTANCE Description: STG Manage Bladder With  min Assistance Outcome: Progressing   Problem: RH SAFETY Goal: RH STG ADHERE TO SAFETY PRECAUTIONS W/ASSISTANCE/DEVICE Description: STG Adhere to Safety Precautions With cues Assistance/Device. Outcome: Progressing   Problem: RH PAIN MANAGEMENT Goal: RH STG PAIN MANAGED AT OR BELOW PT'S PAIN GOAL Description: At or below level 4 Outcome: Progressing   Problem: RH KNOWLEDGE DEFICIT GENERAL Goal: RH STG INCREASE KNOWLEDGE OF SELF CARE AFTER HOSPITALIZATION Description: Patient will be able to manage self care at discharge using handouts and educational resources independently Outcome: Progressing   Problem: Education: Goal: Ability to demonstrate management of disease process will improve Outcome: Progressing Goal: Individualized Educational Video(s) Outcome: Progressing   Problem: Activity: Goal: Capacity to carry out activities will improve Outcome: Progressing   Problem: Cardiac: Goal: Ability to achieve and maintain adequate cardiopulmonary perfusion will improve Outcome: Progressing   Problem: Consults Goal: RH GENERAL PATIENT EDUCATION Description: See Patient Education module for education specifics. Outcome: Progressing

## 2021-05-25 NOTE — Progress Notes (Signed)
Occupational Therapy Session Note  Patient Details  Name: Jeffrey [name] MRN: [id number] Date of Birth: October 31, 1975  Today's Date: 05/25/2021 OT Individual Time: [id number] OT Individual Time Calculation (min): 45 min  and Today's Date: 05/25/2021 OT Missed Time: 15 Minutes Missed Time Reason: Other (comment) (scheduleing conflict)   Short Term Goals: Week 1:  OT Short Term Goal 1 (Week 1): STGs=LTGs due to ELOS  Skilled Therapeutic Interventions/Progress Updates:    Pt resting in w/c upon arrival and requesting to use toilet and then take a shower. Pt amb with RW to bathroom with supervision and completed toileting tasks with supervision. Pt stepped over edge of shower and completed shower in standing-supervision. Pt returned to room to completed dressing with sit<>stand from w/c and don socks seated on EOB. Pt required min A for donning shoes while seated in w/c. Pt reports that he normally sits EOB to don shoes. Pt amb without AD to sink (5') with CGA to completed grooming tasks standing at sink with supervision. Pt transported to day room to group session. Pt missed 15 mins skilled OT services 2/2 scheduling conflict. Pt handed off to OTR leading group.   Therapy Documentation Precautions:  Precautions Precautions: Fall Precaution Comments: B knee pain, premorbid lightheadedness (pt thinks related to meds) Restrictions Weight Bearing Restrictions: No General: General OT Amount of Missed Time: 15 Minutes Pain: Pain Assessment Pain Scale: 0-10 Pain Score: 0-No pain   Therapy/Group: Individual Therapy  Leroy Libman 05/25/2021, 11:30 AM

## 2021-05-25 NOTE — Progress Notes (Signed)
Occupational Therapy Session Note  Patient Details  Name: Jeffrey [name] MRN: [id number] Date of Birth: [birth year]  Today's Date: 05/26/2021 OT Individual Time: 0800-0910 OT Individual Time Calculation (min): 70 min    Short Term Goals: Week 1:  OT Short Term Goal 1 (Week 1): STGs=LTGs due to ELOS  Skilled Therapeutic Interventions/Progress Updates:    Pt greeted in bed with no c/o pain. Agreeable to session, opting to shower later today. Pt able to don his Ted stockings today (without adaptive bag method) with min cues for smoothing out wrinkles. Donned gripper socks at independent level. To work on IADL retraining and higher level balance, pt stripped bed of linen and reapplied linen to bed, ambulating around bed without AD and close supervision assistance. Pt incorporating energy conservation strategies with min cues, also discussed importance of breathing out with exertion to minimize heart strain. Education provided on diaphragmatic breathing strategies during rest as well. Mod I for oral care completion while standing at the sink and then pt transferred to the w/c. Continued IADL retraining in the ADL apartment, pt using device to ambulate in the kitchen area, opening/closing fridge and oven, discussed keeping frequently used meal items within easy reach, minimizing the need to stoop and when stooping to breath out when bending. Talked about keeping pots/pans on burners and how to "slide" them over to sink to wash, pt able to demonstrate this. Advised keeping a chair in the kitchen so that he can sit to rest intermittently while engaging in meal prep. Also discussed how using the RW is a form of energy conservation during IADL. He already has [place] cleaning equipment such as a dustpan and duster. Pt transferred on/off couch using RW at Mod I level. He was then returned to the room via w/c. Left him sitting up with all needs within reach. Solidified DME and f/u needs/preferences.   Therapy  Documentation Precautions:  Precautions Precautions: Fall Precaution Comments: B knee pain, premorbid lightheadedness (pt thinks related to meds) Restrictions Weight Bearing Restrictions: No  Pain Assessment Pain Scale: 0-10 Pain Score: 0-No pain Faces Pain Scale: No hurt Critical Care Pain Observation Tool (CPOT) Facial Expression: Relaxed, neutral Body Movements: Absence of movements Muscle Tension: Relaxed Compliance with ventilator (intubated pts.): N/A ADL: ADL Eating: Not assessed Grooming: Supervision/safety Where Assessed-Grooming: Standing at sink Upper Body Bathing: Setup Where Assessed-Upper Body Bathing: Shower Lower Body Bathing: Supervision/safety Where Assessed-Lower Body Bathing: Shower Upper Body Dressing: Contact guard Where Assessed-Upper Body Dressing: Other (Comment) (standing) Lower Body Dressing: Minimal assistance Where Assessed-Lower Body Dressing: Edge of bed, Other (Comment) (sit<stand from TTB) Toileting: Supervision/safety Where Assessed-Toileting: Glass blower/designer: Therapist, music Method: Ambulating Dietitian) Science writer: Energy manager: Curator Method: Ambulating (RW) Youth worker: Radio broadcast assistant, Grab bars    Therapy/Group: Individual Therapy  Sairah Knobloch A Ranyah Groeneveld 05/26/2021, 12:01 PM

## 2021-05-25 NOTE — Progress Notes (Signed)
Pt has new non productive cough, Spo2 86-89% on RA, No complains of CP or difficulty breathing. Anxious, due meds given and reassured. Notified Dr, PRN guaifenesin ordered. Placed on CPAP with Spo2 of 94%

## 2021-05-25 NOTE — Progress Notes (Signed)
PROGRESS NOTE   Subjective/Complaints: Occasional cough, no shortness of breath, has had O2 sats in the upper 80s.  His breathing improved with CPAP last night.  This morning he is eating breakfast comfortably.  ROS: denies pain, no chest pain no nausea vomiting or diarrhea Objective:   No results found. Recent Labs    05/23/21 0639  WBC 7.4  HGB 13.7  HCT 41.5  PLT 413*    Recent Labs    05/23/21 0639  NA 135  K 4.3  CL 105  CO2 21*  GLUCOSE 139*  BUN 30*  CREATININE 1.77*  CALCIUM 9.8     Intake/Output Summary (Last 24 hours) at 05/25/2021 [id number] Last data filed at 05/24/2021 2014 Gross per 24 hour  Intake 1200 ml  Output 750 ml  Net 450 ml         Physical Exam: Vital Signs Blood pressure 102/80, pulse 90, temperature 98.3 F (36.8 C), temperature source Oral, resp. rate 19, height '5\' 8"'$  (1.727 m), weight (!) 143.8 kg, SpO2 94 %.   General: No acute distress Mood and affect are appropriate Heart: Regular rate and rhythm no rubs murmurs or extra sounds Lungs: Clear to auscultation, breathing unlabored, no rales or wheezes Abdomen: Positive bowel sounds, soft nontender to palpation, nondistended Extremities: No clubbing, cyanosis, or edema  Skin: miliaria pustules on chest , 2 pustules on back  Neurological:     Comments: Patient is alert.  .  Oriented x3 and follows commands.  Moves all extremities, feeds self with bilateral upper extremities.    Assessment/Plan: 1. Functional deficits which require 3+ hours per day of interdisciplinary therapy in a comprehensive inpatient rehab setting. Physiatrist is providing close team supervision and 24 hour management of active medical problems listed below. Physiatrist and rehab team continue to assess barriers to discharge/monitor patient progress toward functional and medical goals  Care Tool:  Bathing    Body parts bathed by patient: Right arm,  Left arm, Chest, Front perineal area, Abdomen, Buttocks, Right upper leg, Left upper leg, Right lower leg, Left lower leg, Face         Bathing assist Assist Level: Supervision/Verbal cueing     Upper Body Dressing/Undressing Upper body dressing   What is the patient wearing?: Pull over shirt    Upper body assist Assist Level: Contact Guard/Touching assist (standing)    Lower Body Dressing/Undressing Lower body dressing      What is the patient wearing?: Pants     Lower body assist Assist for lower body dressing: Supervision/Verbal cueing     Toileting Toileting    Toileting assist Assist for toileting: Independent with assistive device Assistive Device Comment:  (urinal)   Transfers Chair/bed transfer  Transfers assist     Chair/bed transfer assist level: Contact Guard/Touching assist     Locomotion Ambulation   Ambulation assist      Assist level: Contact Guard/Touching assist Assistive device: Walker-rolling Max distance: 200 ft   Walk 10 feet activity   Assist     Assist level: Contact Guard/Touching assist Assistive device: Walker-rolling   Walk 50 feet activity   Assist    Assist level: Contact Guard/Touching assist  Assistive device: Walker-rolling    Walk 150 feet activity   Assist    Assist level: Contact Guard/Touching assist Assistive device: Walker-rolling    Walk 10 feet on uneven surface  activity   Assist     Assist level: Contact Guard/Touching assist Assistive device: Aeronautical engineer Will patient use wheelchair at discharge?: No      Wheelchair assist level: Supervision/Verbal cueing Max wheelchair distance: 150    Wheelchair 50 feet with 2 turns activity    Assist        Assist Level: Supervision/Verbal cueing   Wheelchair 150 feet activity     Assist      Assist Level: Supervision/Verbal cueing   Blood pressure 102/80, pulse 90, temperature 98.3 F (36.8  C), temperature source Oral, resp. rate 19, height '5\' 8"'$  (1.727 m), weight (!) 143.8 kg, SpO2 94 %.  Medical Problem List and Plan: 1.  Debility secondary to acute on chronic systolic congestive heart failure/cardiogenic and ultimate plan for LVAD             -patient may shower             -ELOS/Goals: 5-7 days modI             -Initial CIR evaluations today.  2.  Antithrombotics: -DVT/anticoagulation: Eliquis [employer]: Other (comment)             -antiplatelet therapy: N/A 3. Pain Management: Tramadol as needed 4. Depression: Continue Zoloft 25 mg daily, 5. Neuropsych: This patient is capable of making decisions on his own behalf. 6. Skin/Wound Care: Routine skin checks, miliaria, control room temp sweating , apply powder 7. Fluids/Electrolytes/Nutrition: Routine in and outs with follow-up chemistries 8.  PAF.  Continue Eliquis.  Amiodarone 200 mg twice daily.  Patient underwent emergent TEE guided DCCV 7/27.  Cardiac rate controlled. 9.  Acute hypoxemic respiratory failure.  Resolved.  Antibiotic therapy completed. 10.  AKI on CKD stage III.  Follow-up chemistries. Discussed that creatinine is improving.  11.  OSA.  CPAP 12.  Gout.  Prednisone colchicine completed.  Conitnue allopurinol 13.  Diabetes mellitus.  Continue Jardiance 10 mg daily, NovoLog 6 units 3 times daily, Levemir 50 units twice daily.  Diabetic teaching.  14.  Hypertension.  Currently hypotensive, decrease Cozaar to 12.'5mg'$ . Monitor with increased mobility 15.  Acute exacerbation CHF.  Aldactone 25 mg daily, Demadex 40 mg Monday Wednesday Friday.  No signs of fluid overload, tolerating therapy well 16.  Obesity.  BMI 47.  Dietary follow-up 17. Insomnia: continue trazodone '100mg'$  HS 18. Anxiety: continue Xanax 0.'5mg'$  BID 19. Disposition: HFU scheduled   20.  Occasional cough, no fever, recently treated for pneumonia with IV antibiotics, lung exam unremarkable.  Will treat symptomatically at this point.  Will need  incentive spirometer given history of atelectasis.  LOS: 3 days A FACE TO FACE EVALUATION WAS PERFORMED  Charlett Blake 05/25/2021, 7:11 AM

## 2021-05-25 NOTE — Progress Notes (Addendum)
Physical Therapy Session Note  Patient Details  Name: Jeffrey [name] MRN: [id number] Date of Birth: 1976/06/25  Today's Date: 05/25/2021 and 1500-1530 PT Individual Time: 0900-1000 PT Individual Time Calculation (min): 60 min and 30 min.  Short Term Goals: Week 1:  PT Short Term Goal 1 (Week 1): =LTGs d/t ELOS  Skilled Therapeutic Interventions/Progress Updates:  [name] session:  Pt presents supine in bed and awakens easily.  Pt transfers sup to sit w/ supervision and then sits EOB to don overhead shirt w/ set-up.  Pt transfers sit to stand w/ supervision and the amb x 4' to w/c w/o AD and CGA.  Pt wheeled to main gym for time and energy conservation.  Pt performed multiple sit to stand transfers w/ supervision and education for UE use on arm rests, especially for safe stand to sit.  Pt amb x 100' w/o AD, but noted waddle-type gait and states pain to R knee.  Pt amb multiple trials w/ RW and CGA to close supervision up to 150'.  Pt O2 sats are at 95% or > after gait trials, except x 1 of 90% but returned to 96% within 20 seconds.  Pt performed marching and toe taps to 6" platform w/ cueing of decreased use of Ues on RW.  Pt returned to room and remained in w/c w/ all needs in reach.  Second session.  Pt presents sitting in w/c and eager for therapy.  Pt transfers sit to stand w/ supervision.  Pt amb multiple trials w/ RW and CGA to supervision.  Pt amb up to 150' including turns to return to seat.  O2 sats remained > 96% on RA, w/ HR low 100's throughout session.  Nurse aware.  Pt asking for Voltaren gel as he used at home, nsg aware.  Pt also asking about brace for R knee, and note left for primary PT.  Pt remained sitting in w/c w/ all needs in reach. Therapy Documentation Precautions:  Precautions Precautions: Fall Precaution Comments: B knee pain, premorbid lightheadedness (pt thinks related to meds) Restrictions Weight Bearing Restrictions: No General:   Vital Signs:   Pain:8/10 R knee,  w/ pain meds but states is always at this level. Pain Assessment Pain Scale: 0-10 Pain Score: 0-No pain Mobility:   Locomotion :    Trunk/Postural Assessment :    Balance:   Exercises:   Other Treatments:      Therapy/Group: Individual Therapy  Ladoris Gene 05/25/2021, 10:03 AM

## 2021-05-25 NOTE — Progress Notes (Signed)
Patient has home CPAP at bedside and places on himself without assistance. RT will monitor as needed.

## 2021-05-26 DIAGNOSIS — R570 Cardiogenic shock: Secondary | ICD-10-CM | POA: Diagnosis not present

## 2021-05-26 DIAGNOSIS — I480 Paroxysmal atrial fibrillation: Secondary | ICD-10-CM | POA: Diagnosis not present

## 2021-05-26 DIAGNOSIS — E1121 Type 2 diabetes mellitus with diabetic nephropathy: Secondary | ICD-10-CM | POA: Diagnosis not present

## 2021-05-26 DIAGNOSIS — I5022 Chronic systolic (congestive) heart failure: Secondary | ICD-10-CM | POA: Diagnosis not present

## 2021-05-26 LAB — GLUCOSE, CAPILLARY
Glucose-Capillary: 94 mg/dL (ref 70–99)
Glucose-Capillary: 125 mg/dL — ABNORMAL HIGH (ref 70–99)
Glucose-Capillary: 114 mg/dL — ABNORMAL HIGH (ref 70–99)
Glucose-Capillary: 106 mg/dL — ABNORMAL HIGH (ref 70–99)

## 2021-05-26 LAB — BASIC METABOLIC PANEL
Anion gap: 9 (ref 5–15)
BUN: 31 mg/dL — ABNORMAL HIGH (ref 6–20)
CO2: 20 mmol/L — ABNORMAL LOW (ref 22–32)
Calcium: 9.7 mg/dL (ref 8.9–10.3)
Chloride: 105 mmol/L (ref 98–111)
Creatinine, Ser: 1.69 mg/dL — ABNORMAL HIGH (ref 0.61–1.24)
GFR, Estimated: 50 mL/min — ABNORMAL LOW (ref >60)
Glucose, Bld: 169 mg/dL — ABNORMAL HIGH (ref 70–99)
Potassium: 3.9 mmol/L (ref 3.5–5.1)
Sodium: 134 mmol/L — ABNORMAL LOW (ref 135–145)

## 2021-05-26 MED ORDER — TRAMADOL HCL 50 MG PO TABS
100.0000 mg | ORAL_TABLET | Freq: Every day | ORAL | Status: DC | PRN
  Administered 2021-05-26 – 2021-05-27 (×2): 100 mg via ORAL
  Filled 2021-05-26 (×2): qty 2

## 2021-05-26 NOTE — Progress Notes (Signed)
Occupational Therapy Session Note  Patient Details  Name: Jeffrey [name] MRN: [id number] Date of Birth: 16-Mar-1976  Today's Date: 05/26/2021 OT Individual Time: 1035-1130 OT Individual Time Calculation (min): 55 min    Short Term Goals: Week 1:  OT Short Term Goal 1 (Week 1): STGs=LTGs due to ELOS  Skilled Therapeutic Interventions/Progress Updates:    Treatment session with focus on functional mobility and d/c planning.  Pt received upright in w/c agreeable to therapy session.  Engaged in discussion about progress towards goals and upcoming d/c date.  Pt reports having engaged in functional mobility in ADL apt to complete IADLs with previous OT and feeling adequate practice.  Pt expressing desire to go outside.  Pt donned shoes seated EOB Mod I and then transferred back to w/c stand pivot Mod I.  Therapist transported pt outside for time management purposes and energy conservation.  Pt ambulated 150' x2 with RW with min cues for upright posture and decreased reliance on RW.  Pt ambulated over various surfaces outside.  Therapist reiterated energy conservation strategies as previously discussed with further explanation with routine tasks that pt completes.  Educated on energy conservation strategies when returning to community re-entry.  Pt transported back to room.  Pt ambulated in to bathroom without AD with supervision.  Pt voided urine in standing and then ambulated to sink with supervision to wash hands. Pt returned to sitting upright in w/c and left with all needs in reach.  Therapy Documentation Precautions:  Precautions Precautions: Fall Precaution Comments: B knee pain, premorbid lightheadedness (pt thinks related to meds) Restrictions Weight Bearing Restrictions: No  Pain: Pain Assessment Pain Scale: 0-10 Pain Score: 0-No pain Faces Pain Scale: No hurt Critical Care Pain Observation Tool (CPOT) Facial Expression: Relaxed, neutral Body Movements: Absence of movements Muscle  Tension: Relaxed Compliance with ventilator (intubated pts.): N/A   Therapy/Group: Individual Therapy  Simonne Come 05/26/2021, 12:13 PM

## 2021-05-26 NOTE — Progress Notes (Signed)
PROGRESS NOTE   Subjective/Complaints: No complaints this morning Excited for discharge Wednesday Doing really well with therapy Cr stable today  ROS: denies pain, no chest pain no nausea vomiting or diarrhea Objective:   No results found. No results for input(s): WBC, HGB, HCT, PLT in the last 72 hours. Recent Labs    05/26/21 0901  NA 134*  K 3.9  CL 105  CO2 20*  GLUCOSE 169*  BUN 31*  CREATININE 1.69*  CALCIUM 9.7    Intake/Output Summary (Last 24 hours) at 05/26/2021 1324 Last data filed at 05/26/2021 0800 Gross per 24 hour  Intake 356 ml  Output 1260 ml  Net -904 ml        Physical Exam: Vital Signs Blood pressure 110/84, pulse 90, temperature 98.6 F (37 C), temperature source Oral, resp. rate 17, height '5\' 8"'$  (1.727 m), weight (!) 139.3 kg, SpO2 95 %.   General: No acute distress, BMI 46.68 Mood and affect are appropriate Heart: Regular rate and rhythm no rubs murmurs or extra sounds Lungs: Clear to auscultation, breathing unlabored, no rales or wheezes Abdomen: Positive bowel sounds, soft nontender to palpation, nondistended Extremities: No clubbing, cyanosis, or edema  Skin: miliaria pustules on chest , 2 pustules on back  Neurological:     Comments: Patient is alert.  .  Oriented x3 and follows commands.  Moves all extremities, feeds self with bilateral upper extremities.    Assessment/Plan: 1. Functional deficits which require 3+ hours per day of interdisciplinary therapy in a comprehensive inpatient rehab setting. Physiatrist is providing close team supervision and 24 hour management of active medical problems listed below. Physiatrist and rehab team continue to assess barriers to discharge/monitor patient progress toward functional and medical goals  Care Tool:  Bathing    Body parts bathed by patient: Right arm, Left arm, Chest, Front perineal area, Abdomen, Buttocks, Right upper  leg, Left upper leg, Right lower leg, Left lower leg, Face         Bathing assist Assist Level: Supervision/Verbal cueing     Upper Body Dressing/Undressing Upper body dressing   What is the patient wearing?: Pull over shirt    Upper body assist Assist Level: Independent    Lower Body Dressing/Undressing Lower body dressing      What is the patient wearing?: Pants     Lower body assist Assist for lower body dressing: Supervision/Verbal cueing     Toileting Toileting    Toileting assist Assist for toileting: Supervision/Verbal cueing Assistive Device Comment:  (urinal)   Transfers Chair/bed transfer  Transfers assist     Chair/bed transfer assist level: Supervision/Verbal cueing     Locomotion Ambulation   Ambulation assist      Assist level: Contact Guard/Touching assist Assistive device: Walker-rolling Max distance: 150   Walk 10 feet activity   Assist     Assist level: Supervision/Verbal cueing Assistive device: Walker-rolling   Walk 50 feet activity   Assist    Assist level: Supervision/Verbal cueing Assistive device: Walker-rolling    Walk 150 feet activity   Assist    Assist level: Supervision/Verbal cueing Assistive device: Walker-rolling    Walk 10 feet on uneven surface  activity   Assist     Assist level: Contact Guard/Touching assist Assistive device: Walker-rolling   Wheelchair     Assist Is the patient using a wheelchair?: No      Wheelchair assist level: Supervision/Verbal cueing Max wheelchair distance: 150    Wheelchair 50 feet with 2 turns activity    Assist        Assist Level: Supervision/Verbal cueing   Wheelchair 150 feet activity     Assist      Assist Level: Supervision/Verbal cueing   Blood pressure 110/84, pulse 90, temperature 98.6 F (37 C), temperature source Oral, resp. rate 17, height '5\' 8"'$  (1.727 m), weight (!) 139.3 kg, SpO2 95 %.  Medical Problem List and  Plan: 1.  Debility secondary to acute on chronic systolic congestive heart failure/cardiogenic and ultimate plan for LVAD             -patient may shower             -ELOS/Goals: 5-7 days modI             Continue CIR 2.  Antithrombotics: -DVT/anticoagulation: Eliquis [employer]: Other (comment)             -antiplatelet therapy: N/A 3. Pain: decrease Tramadol to daily PRN.  4. Depression: Continue Zoloft 25 mg daily, 5. Neuropsych: This patient is capable of making decisions on his own behalf. 6. Skin/Wound Care: Routine skin checks, miliaria, control room temp sweating , apply powder 7. Fluids/Electrolytes/Nutrition: Routine in and outs with follow-up chemistries 8.  PAF.  Continue Eliquis.  Amiodarone 200 mg twice daily.  Patient underwent emergent TEE guided DCCV 7/27.  Cardiac rate controlled. 9.  Acute hypoxemic respiratory failure.  Resolved.  Antibiotic therapy completed. 10.  AKI on CKD stage III.  Follow-up chemistries. Discussed that creatinine is improving.  11.  OSA.  CPAP 12.  Gout.  Prednisone colchicine completed.  Conitnue allopurinol 13.  Diabetes mellitus.  Continue Jardiance 10 mg daily, NovoLog 6 units 3 times daily, Levemir 50 units twice daily.  Diabetic teaching. Provided list of great foods for diabetes.  14.  Hypertension.  Currently hypotensive, decrease Cozaar to 12.'5mg'$ . Monitor with increased mobility 15.  Acute exacerbation CHF.  Aldactone 25 mg daily, Demadex 40 mg Monday Wednesday Friday.  No signs of fluid overload, tolerating therapy well 16.  Obesity.  BMI 47.  Dietary follow-up 17. Insomnia: continue trazodone '100mg'$  HS 18. Anxiety: continue Xanax 0.'5mg'$  BID 19. Disposition: HFU scheduled   20.  Occasional cough, no fever, recently treated for pneumonia with IV antibiotics, lung exam unremarkable.  Will treat symptomatically at this point.  Will need incentive spirometer given history of atelectasis.  LOS: 4 days A FACE TO FACE EVALUATION WAS  PERFORMED  Martha Clan P Fran Mcree 05/26/2021, 1:24 PM

## 2021-05-26 NOTE — Progress Notes (Signed)
RT NOTE:  Pt has home CPAP at bedside that he manages. RT available if needed.

## 2021-05-26 NOTE — Progress Notes (Signed)
Physical Therapy Session Note  Patient Details  Name: Jeffrey [name] MRN: [id number] Date of Birth: Aug 28, 1976  Today's Date: 05/26/2021 PT Individual Time: 1520-1607 PT Individual Time Calculation (min): 47 min   Short Term Goals: Week 1:  PT Short Term Goal 1 (Week 1): =LTGs d/t ELOS  Skilled Therapeutic Interventions/Progress Updates:    Patient in supine asleep, but easily aroused.  Reports fell asleep watching TV so did not wear his CPAP.  Patient reaching to floor to don shoes with S.  Sit to stand S and transferred to w/c with RW and S.  In w/c assisted to therapy gym.  Patient ambulated 200' with RW and S.  Reports R knee pain as noted.  Discussed brace options per pt request.  Agreeable to try taping.  Applied kinesiotex tape to R knee with 2 "Y"s  for pain control/joint support.  Patient standing for step taps to 4" step x 10 each leg without UE support.  Ambulated with SPC kicking bean bags to work on SLS.  Patient negotiated 8 steps with rails and S.  Self selected sequence descending with L first.  Patient assisted in w/c to room.  Requesting to toilet.  Patient ambulated without device with S to bathroom and completed toileting task with distant S.  Patient returned to sit EOB after handwashing and left with all needs in reach and bed alarm active.   Therapy Documentation Precautions:  Precautions Precautions: Fall Precaution Comments: B knee pain, premorbid lightheadedness (pt thinks related to meds) Restrictions Weight Bearing Restrictions: No  Pain: Pain Assessment Pain Score: 9  Pain Type: Chronic pain Pain Location: Knee Pain Orientation: Right Pain Descriptors / Indicators: Aching Pain Onset: On-going Pain Intervention(s): Other (Comment) (kinesiotape)     Therapy/Group: Individual Therapy  Reginia Naas Magda Kiel, PT 05/26/2021, 5:17 PM

## 2021-05-26 NOTE — Progress Notes (Signed)
Patient complaining of cough with concern since its new and low grade fever (99.5). Off going nurse reported blood in urine, although patient flushed and it was not visualized by staff. Writer said they would leave note so provider can address in morning.

## 2021-05-26 NOTE — Progress Notes (Addendum)
Advanced Heart Failure Rounding Note  PCP-Cardiologist: Glori Bickers, MD   Subjective:    Admitted to CIR on 08/18  Strength gradually improving. Able to ambulate short distance without walker yesterday.   Scr 1.77 on 08/19. No labs over weekend. BMET ordered for today.   Discussed daily standing weights with RN - combination of standing and bed weights recorded.  BP better, 100s-110/80s  Objective:   Weight Range: (!) 143.6 kg Body mass index is 48.14 kg/m.   Vital Signs:   Temp:  [98 F (36.7 C)-98.6 F (37 C)] 98.6 F (37 C) (08/22 0611) Pulse Rate:  [88-93] 90 (08/22 0611) Resp:  [16-18] 17 (08/22 0611) BP: (107-110)/(82-84) 110/84 (08/22 0611) SpO2:  [92 %-96 %] 95 % (08/22 0611) Weight:  [143.6 kg] 143.6 kg (08/21 2124) Last BM Date: 05/24/21  Weight change: Filed Weights   05/23/21 0500 05/25/21 0500 05/25/21 2124  Weight: (!) 142.7 kg (!) 143.8 kg (!) 143.6 kg    Intake/Output:   Intake/Output Summary (Last 24 hours) at 05/26/2021 0718 Last data filed at 05/25/2021 2207 Gross per 24 hour  Intake 714 ml  Output 1260 ml  Net -546 ml      Physical Exam    General:  Well appearing. No resp difficulty HEENT: normal Neck: supple. no JVD lying flat in bed. Carotids 2+ bilat; no bruits. No lymphadenopathy or thryomegaly appreciated. Cor: PMI nondisplaced. Regular rate & rhythm. No rubs, gallops or murmurs. Lungs: CTA Abdomen: soft, nontender, nondistended. No hepatosplenomegaly. No bruits or masses. Good bowel sounds. Extremities: no cyanosis, clubbing, rash, edema Neuro: alert & orientedx3, cranial nerves grossly intact. moves all 4 extremities w/o difficulty. Affect pleasant    Telemetry   No Tele at CIR  EKG    No new ECG for review  Labs    CBC No results for input(s): WBC, NEUTROABS, HGB, HCT, MCV, PLT in the last 72 hours.  Basic Metabolic Panel No results for input(s): NA, K, CL, CO2, GLUCOSE, BUN, CREATININE, CALCIUM, MG,  PHOS in the last 72 hours.  Liver Function Tests No results for input(s): AST, ALT, ALKPHOS, BILITOT, PROT, ALBUMIN in the last 72 hours.  No results for input(s): LIPASE, AMYLASE in the last 72 hours. Cardiac Enzymes No results for input(s): CKTOTAL, CKMB, CKMBINDEX, TROPONINI in the last 72 hours.  BNP: BNP (last 3 results) Recent Labs    11/21/20 2222 04/29/21 1247  BNP 65.0 1,407.7*    ProBNP (last 3 results) No results for input(s): PROBNP in the last 8760 hours.   D-Dimer No results for input(s): DDIMER in the last 72 hours. Hemoglobin A1C No results for input(s): HGBA1C in the last 72 hours. Fasting Lipid Panel No results for input(s): CHOL, HDL, LDLCALC, TRIG, CHOLHDL, LDLDIRECT in the last 72 hours. Thyroid Function Tests No results for input(s): TSH, T4TOTAL, T3FREE, THYROIDAB in the last 72 hours.  Invalid input(s): FREET3  Other results:   Imaging    No results found.   Medications:     Scheduled Medications:  allopurinol  100 mg Oral Daily   ALPRAZolam  0.5 mg Oral BID   amiodarone  200 mg Oral BID   apixaban  5 mg Oral BID   docusate sodium  100 mg Oral BID   empagliflozin  10 mg Oral Daily   insulin aspart  0-20 Units Subcutaneous TID WC   insulin aspart  6 Units Subcutaneous TID WC   insulin detemir  50 Units Subcutaneous BID  losartan  12.5 mg Oral QHS   nutrition supplement (JUVEN)  1 packet Oral BID BM   nystatin   Topical BID   pantoprazole  40 mg Oral Daily   polyethylene glycol  17 g Oral Daily   sertraline  25 mg Oral Daily   spironolactone  25 mg Oral Daily   traZODone  100 mg Oral QHS    Infusions:   PRN Medications: acetaminophen, guaiFENesin-dextromethorphan, nitroGLYCERIN, sorbitol, traMADol    Assessment/Plan  1. 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  Echo in 9/21 with EF 20-25%.  Echo this admission with EF < 20%, severe LV dilation, moderate RV  dysfunction.   - s/p S-ICD - 7/27: intubated with pulmonary edema and required addition of NE.  - Dobutamine weaned off on 8/3 - Appears euvolemic. Off Torsemide d/t low volume status and worsening creatinine. Can add back as needed. Last Scr 1.77 on 08/19. BMET ordered for this am. - Discussed daily standing weights with RN - Bidil stopped 8/12 due to hypotension - Losartan reduced to 12.5 mg daily d/t hypotension. BP improving. If labs stable, will try to increase Losartan back to 25 mg daily. - Entresto stopped 08/16 d/t hypotension - Continue spiro 25 mg daily - Continue jardiance 10 mg daily - No BB d/t recent shock - VAD work-up started.    2 Atrial fibrillation: Paroxysmal - Suspect AF with RVR drove initial CHF exacerbation. Emergent TEE-guided DCCV 7/27 due to inability to control HR - Recurrent AF on 8/5. s/p DC-CV on 05/10/21. Rhythm is regular on exam. - Seen by EP. Not a candidate for ablation due to body habitus  (could consider AVN ablation and CRT, if needed)  - Amiodarone initially stopped due to concern for toxicity, but resumed after reversion to rapid afib on 8/5. IV amio off, now on po 200 mg BID - Continue Eliquis.  - Keep K> 4 Mg > 2.   3. Acute hypoxemic respiratory failure - Resp culture + for rare S. Aureus (MSSA)  - Treated with linezolid and cefepime. Cefepime switched to Ancef and Linezolid discontinued on 8/3. Ancef stopped 8/7 - Initial concern for amiodarone toxicity -- amio stopped and steroids started, but steroids stopped the next morning on 8/4 -- per CCM team, more likely atelectasis driving hypoxia.  Deferred management to them. CT chest not c/w amio toxicity despite ESR 128 - Extubated on 8/4 -02 desaturations to upper 80s noted over weekend while lying in bed without CPAP. 02 sats stable last 24 hrs.  4. AKI on CKD stage 3  - Has solitary kidney.   - Suspect cardiorenal syndrome/low output with AF/RVR in setting of cardiomyopathy.   - Baseline  creatinine is 1.5-1.7, Creatinine peaked at 4.65. 1.60 -> 1.77 -> 1.96 -> 1.77. Labs today pending.  - Holding Torsemide for now - Urinary obstruction likely played a role. Urology placed foley catheter with improvement in UOP. No residual retention. - Avoid hypotension. Off bidil. BP better after switching Entresto to Losartan and stopping Torsemide.   5. OSA - CPAP    6. Hypokalemia - Resolved - Continue monitoring. Labs today.   7. Acute gout - Received prednisone and colchicine -> resolved - Continue allopurinol   8. DM2. Poorly controlled - Jardiance - continue SSI - Hgb A1c 9.4 - DM consulted, appreciate input. Blood glucose gradually improved.   9. Debility - HF much improved. Main issue is severe debility.  - Strength  improving gradually. Appreciate assistance from CIR. Hopefully he will continue to progress favorably as suspect he will need VAD in next few months.      Length of Stay: 4  [name], Carlyss, PA-C  05/26/2021, 7:18 AM  Advanced Heart Failure Team Pager [phone] (M-F; 7a - 5p)  Please contact [facility] for night-coverage after hours (5p -7a ) and weekends on [email or web address]  Patient seen and examined with the above-signed Advanced Practice Provider and/or Housestaff. I personally reviewed laboratory data, imaging studies and relevant notes. I independently examined the patient and formulated the important aspects of the plan. I have edited the note to reflect any of my changes or salient points. I have personally discussed the plan with the patient and/or family.  Doing well. Improving rapidly with CIR. Feels stronger. Ambulating. SCr stable at 1.7  General:  Well appearing. No resp difficulty HEENT: normal Neck: supple. no JVD. Carotids 2+ bilat; no bruits. No lymphadenopathy or thryomegaly appreciated. Cor: PMI nondisplaced. Regular rate & rhythm. No rubs, gallops or murmurs. Lungs: clear Abdomen: obese soft, nontender, nondistended. No  hepatosplenomegaly. No bruits or masses. Good bowel sounds. Extremities: no cyanosis, clubbing, rash, edema Neuro: alert & orientedx3, cranial nerves grossly intact. moves all 4 extremities w/o difficulty. Affect pleasant  Volume status looks good. Strength improving. Maintaining NSR. No bleeding with Eliquis. SCr stable.   Continue current regimen. BP has been too soft to titrate GDMT.   Glori Bickers, MD  12:11 PM

## 2021-05-26 NOTE — Progress Notes (Signed)
Patient ID: Jeffrey [name], male   DOB: 1976-09-07, 45 y.o.   MRN: [id number] Team feels will reach goals by Wed 8/24. MD in agreement will work toward discharge for 8/24. Pt in agreement with plan

## 2021-05-26 NOTE — Discharge Summary (Signed)
Physician Discharge Summary  Patient ID: Jeffrey [name] MRN: [id number] DOB/AGE: [id number] y.o.  Admit date: 05/22/2021 Discharge date: 05/28/2021  Discharge Diagnoses:  Active Problems:   Cardiogenic shock (HCC) DVT prophylaxis PAF Acute hypoxemic respiratory failure AKI/CKD stage III OSA Gout Hypertension Diabetes mellitus Acute exacerbation CHF Obesity Anxiety History of renal cell carcinoma  Discharged Condition: Stable  Significant Diagnostic Studies: CT ABDOMEN PELVIS WO CONTRAST  Result Date: 05/12/2021 CLINICAL DATA:  Preoperative, scheduled for LVAD. EXAM: CT ABDOMEN AND PELVIS WITHOUT CONTRAST TECHNIQUE: Multidetector CT imaging of the abdomen and pelvis was performed following the standard protocol without IV contrast. COMPARISON:  None. FINDINGS: Lower chest: Left chest wall battery pack with lead coursing towards the anterior midline chest and above the margins of. Hepatobiliary: No visible focal liver lesion. Smooth surface contour. Normal gallbladder and biliary tree. Pancreas: No pancreatic ductal dilatation or surrounding inflammatory changes. Spleen: Normal in size. No concerning splenic lesions. Small accessory splenule towards the splenic hilum. Adrenals/Urinary Tract: Normal adrenal glands. Surgical absence of the right kidney. Very mild left perinephric stranding is similar to comparison, nonspecific. No concerning renal mass. No urolithiasis or hydronephrosis. Urinary bladder is unremarkable for the degree of distention. Stomach/Bowel: Distal esophagus, stomach and duodenum are unremarkable. Normal duodenal sweep across the midline abdomen. There are some mid abdominal small bowel loops which are borderline air and fluid distended with fecalized material as well. Appearance could suggest some mild ileus or slowed transit with high-grade obstruction less favored. No colonic dilatation or wall thickening. Vascular/Lymphatic: No significant vascular findings are  present. No enlarged abdominal or pelvic lymph nodes. Reproductive: Few typically benign punctate prostate calcifications. Prostate seminal vesicles are otherwise unremarkable. Other: No abdominopelvic free fluid or free gas. No bowel containing hernias. Mild soft tissue edema in the flanks and lateral abdominal wall. Musculoskeletal: Multilevel degenerative changes are present in the imaged portions of the spine. Features most pronounced in the lower thoracic spine. No acute osseous abnormality or suspicious osseous lesion. Musculature is normal and symmetric IMPRESSION: Few borderline distended loops of small bowel seen in the anterior midline abdomen, could reflect mild ileus or slowed intestinal transit with high-grade obstruction significantly less favored. Prior right nephrectomy. Battery pack along the low left chest wall with leads pacing anteriorly to the midline and above the margins of imaging. Electronically Signed   By: Lovena Le M.D.   On: 05/12/2021 21:27   DG Chest 1 View  Result Date: 05/01/2021 CLINICAL DATA:  Respiratory failure. EXAM: CHEST  1 VIEW COMPARISON:  04/30/2021. FINDINGS: Endotracheal tube, NG tube, left IJ line in stable position. AICD in stable position. Stable severe cardiomegaly. Diffuse bilateral pulmonary infiltrates/edema again noted. Slight interim improvement. No pleural effusion or pneumothorax. IMPRESSION: 1.  Lines and tubes in stable position. 2. AICD in stable position. Stable severe cardiomegaly. Diffuse bilateral pulmonary infiltrates/edema again noted with slight interim improvement. Electronically Signed   By: Marcello Moores  Register   On: 05/01/2021 06:41   DG Chest 2 View  Result Date: 04/29/2021 CLINICAL DATA:  Chest pain EXAM: CHEST - 2 VIEW COMPARISON:  11/21/2020 chest radiograph. FINDINGS: Stable configuration of single lead left chest ICD with lead tip overlying the upper left heart. Stable cardiomediastinal silhouette with mild cardiomegaly. No  pneumothorax. No pleural effusion. Borderline mild pulmonary edema. No consolidative airspace disease. IMPRESSION: Borderline mild congestive heart failure. Electronically Signed   By: Ilona Sorrel M.D.   On: 04/29/2021 13:07   DG Abd 1 View  Result Date: 05/07/2021  CLINICAL DATA:  Feeding tube placement. EXAM: ABDOMEN - 1 VIEW COMPARISON:  Chest x-ray 05/06/2021. FINDINGS: Limited views of the abdomen obtained. Feeding tube noted with tip over the upper stomach. Multiple prominently dilated loops of small bowel noted. Colon is nondistended. Small-bowel obstruction cannot be excluded. Abdominal series can be obtained for further evaluation. IMPRESSION: 1. Limited views of the abdomen obtained. Feeding tube noted with tip over the upper stomach. 2. Multiple prominent dilated loops of small bowel noted. Colon is nondistended. Small-bowel obstruction cannot be excluded. Abdominal series can be obtained for further evaluation. Electronically Signed   By: Marcello Moores  Register   On: 05/07/2021 11:54   CT CHEST WO CONTRAST  Result Date: 05/07/2021 CLINICAL DATA:  Respiratory failure EXAM: CT CHEST WITHOUT CONTRAST TECHNIQUE: Multidetector CT imaging of the chest was performed following the standard protocol without IV contrast. COMPARISON:  05/06/2021, 12/28/2014 FINDINGS: Cardiovascular: Unenhanced imaging of the heart demonstrates mild cardiomegaly without pericardial effusion. Normal caliber of the thoracic aorta. Evaluation of the vascular lumen is limited without intravenous contrast. Left internal jugular catheter identified tip within the superior vena cava. Mediastinum/Nodes: Endotracheal tube identified, tip well above carina. Enteric catheter extends into the gastric lumen, tip within the gastric antrum. No pathologic adenopathy. Lungs/Pleura: Bilateral dependent lower lobe consolidation most consistent with atelectasis. No effusion or pneumothorax. Central airways are patent. Upper Abdomen: No acute  abnormality. Musculoskeletal: No acute or destructive bony lesions. Single lead defibrillator within the left lateral chest wall, lead anterior to the sternum. Reconstructed images demonstrate no additional findings. IMPRESSION: 1. Cardiomegaly. 2. Dependent lower lobe atelectasis. No airspace disease or effusion. 3. Support devices as above. Electronically Signed   By: Randa Ngo M.D.   On: 05/07/2021 15:51   CARDIAC CATHETERIZATION  Result Date: 05/13/2021 Findings: RA = 6 RV = 29/3 PA = 35/12 (22) PCW = 16 Fick cardiac output/index = 5.5/2.3 PVR = 1.1 WU Ao sat = 98% PA sat = 65%, 67% Assessment: Well-compensated hemodynamics Plan/Discussion: Continue current therapy. Glori Bickers, MD 1:59 PM  DG CHEST PORT 1 VIEW  Result Date: 05/08/2021 CLINICAL DATA:  Hypoxia EXAM: PORTABLE CHEST 1 VIEW COMPARISON:  05/06/2021 FINDINGS: Slight interval improvement in diffuse bilateral interstitial pulmonary opacity, with persistent small, layering bilateral pleural effusions. Interval placement of partially imaged enteric feeding tube, with otherwise unchanged support apparatus including endotracheal tube and left neck vascular catheter. Gross cardiomegaly. IMPRESSION: 1. Slight interval improvement in diffuse bilateral interstitial pulmonary opacity, with persistent small, layering bilateral pleural effusions. Findings are consistent with improved edema or infection. 2. Interval placement of partially imaged enteric feeding tube, with otherwise unchanged support apparatus. Electronically Signed   By: Eddie Candle M.D.   On: 05/08/2021 10:57   DG CHEST PORT 1 VIEW  Result Date: 05/06/2021 CLINICAL DATA:  Shortness of breath, chest pain EXAM: PORTABLE CHEST 1 VIEW COMPARISON:  05/05/2021 FINDINGS: Support devices are stable. Cardiomegaly with vascular congestion. Layering bilateral effusions. Bibasilar atelectasis. Probable mild pulmonary edema. IMPRESSION: Stable support devices. Stable pulmonary edema and  layering effusions. Electronically Signed   By: Rolm Baptise M.D.   On: 05/06/2021 10:16   DG Chest Port 1 View  Result Date: 05/05/2021 CLINICAL DATA:  ET tube present, acute CHF EXAM: PORTABLE CHEST 1 VIEW COMPARISON:  05/04/2021 FINDINGS: Support devices are unchanged. Cardiomegaly. Bilateral airspace disease and layering effusions. No real change since prior study. IMPRESSION: No interval change in bilateral airspace disease and layering effusions. Electronically Signed   By: Rolm Baptise M.D.  On: 05/05/2021 08:23   DG CHEST PORT 1 VIEW  Result Date: 05/04/2021 CLINICAL DATA:  Acute hypercapnic respiratory failure. EXAM: PORTABLE CHEST 1 VIEW COMPARISON:  May 03, 2021 FINDINGS: The ETT, left central line, and AICD device are stable, in good position. The NG tube terminates below today's film. No pneumothorax. No nodules or masses. Bilateral layering effusions are more pronounced in the interval. Increased interstitial markings in the lungs may represent pulmonary venous congestion/mild edema. The cardiomediastinal silhouette is unchanged. IMPRESSION: 1. Support apparatus as above. 2. Bilateral pleural effusions are more pronounced in the interval. Pulmonary venous congestion versus mild pulmonary edema. No other changes. Electronically Signed   By: Dorise Bullion III M.D   On: 05/04/2021 09:11   DG CHEST PORT 1 VIEW  Result Date: 05/03/2021 CLINICAL DATA:  [place] weaning. EXAM: PORTABLE CHEST 1 VIEW COMPARISON:  May 01, 2021 FINDINGS: The AICD device is stable. An ETT is in adequate position. The NG tube terminates below today's film. No pneumothorax. The left central line is stable. Haziness over the left base identified. Haziness over the right base has resolved. No overt edema. IMPRESSION: 1. Support apparatus as above. 2. Haziness over the left lower lung may represent layering effusion with underlying atelectasis. Recommend attention on follow-up. No overt edema on today's study.  Electronically Signed   By: Dorise Bullion III M.D   On: 05/03/2021 11:55   DG CHEST PORT 1 VIEW  Result Date: 04/30/2021 CLINICAL DATA:  Heart failure, intubated EXAM: PORTABLE CHEST 1 VIEW COMPARISON:  Chest radiograph from one day prior. FINDINGS: Endotracheal tube tip is 4.8 cm above the carina. Left internal jugular central venous catheter terminates at the cavoatrial junction. Enteric tube enters stomach with the tip not seen on this image. Stable configuration of single lead left chest ICD. Stable cardiomediastinal silhouette with mild cardiomegaly. No pneumothorax. No pleural effusion. Diffuse fluffy parahilar lung opacities, significantly worsened. IMPRESSION: 1. Well-positioned support structures as detailed. 2. Mild cardiomegaly. Diffuse fluffy parahilar lung opacities, significantly worsened, compatible with flash cardiogenic pulmonary edema. Electronically Signed   By: Ilona Sorrel M.D.   On: 04/30/2021 17:27   ECHOCARDIOGRAM COMPLETE  Result Date: 04/30/2021    ECHOCARDIOGRAM REPORT   Patient Name:   Jeffrey [name] Date of Exam: 04/30/2021 Medical Rec #:  [id number]    Height:       68.0 in Accession #:    [id number]   Weight:       337.0 lb Date of Birth:  November 21, 1975    BSA:          2.551 m Patient Age:    74 years     BP:           129/102 mmHg Patient Gender: M            HR:           98 bpm. Exam Location:  Inpatient Procedure: 2D Echo, Cardiac Doppler, Color Doppler and Intracardiac            Opacification Agent Indications:    CHF  History:        Patient has prior history of Echocardiogram examinations, most                 recent 04/30/2021. CHF and Cardiomyopathy, Defibrillator,                 Arrythmias:Atrial Fibrillation and Cardioversion 04/30/21,  Signs/Symptoms:CKD; Risk Factors:Hypertension, Dyslipidemia,                 Diabetes and Morbid obesity.  Sonographer:    Dustin Flock Referring Phys: [place]  1. Left ventricular ejection  fraction, by estimation, is <20%. The left ventricle has severely decreased function. The left ventricle demonstrates global hypokinesis. The left ventricular internal cavity size was severely dilated. Left ventricular diastolic parameters are consistent with Grade III diastolic dysfunction (restrictive). Elevated left atrial pressure.  2. Right ventricular systolic function is moderately reduced. The right ventricular size is normal. There is mildly elevated pulmonary artery systolic pressure. The estimated right ventricular systolic pressure is 99991111 mmHg.  3. Left atrial size was severely dilated.  4. The mitral valve is normal in structure. Trivial mitral valve regurgitation.  5. The aortic valve is tricuspid. Aortic valve regurgitation is not visualized. No aortic stenosis is present.  6. The inferior vena cava is dilated in size with <50% respiratory variability, suggesting right atrial pressure of 15 mmHg. FINDINGS  Left Ventricle: Left ventricular ejection fraction, by estimation, is <20%. The left ventricle has severely decreased function. The left ventricle demonstrates global hypokinesis. Definity contrast agent was given IV to delineate the left ventricular endocardial borders. The left ventricular internal cavity size was severely dilated. There is no left ventricular hypertrophy. Left ventricular diastolic parameters are consistent with Grade III diastolic dysfunction (restrictive). Elevated left atrial pressure. Right Ventricle: The right ventricular size is normal. Right vetricular wall thickness was not well visualized. Right ventricular systolic function is moderately reduced. There is mildly elevated pulmonary artery systolic pressure. The tricuspid regurgitant velocity is 2.42 m/s, and with an assumed right atrial pressure of 15 mmHg, the estimated right ventricular systolic pressure is 99991111 mmHg. Left Atrium: Left atrial size was severely dilated. Right Atrium: Right atrial size was normal in  size. Pericardium: Trivial pericardial effusion is present. Mitral Valve: The mitral valve is normal in structure. Trivial mitral valve regurgitation. Tricuspid Valve: The tricuspid valve is normal in structure. Tricuspid valve regurgitation is trivial. Aortic Valve: The aortic valve is tricuspid. Aortic valve regurgitation is not visualized. No aortic stenosis is present. Pulmonic Valve: The pulmonic valve was normal in structure. Pulmonic valve regurgitation is trivial. Aorta: The aortic root is normal in size and structure. Venous: The inferior vena cava is dilated in size with less than 50% respiratory variability, suggesting right atrial pressure of 15 mmHg. IAS/Shunts: The interatrial septum was not well visualized.  LEFT VENTRICLE PLAX 2D LVIDd:         6.90 cm  Diastology LVIDs:         6.20 cm  LV e' medial:    5.66 cm/s LV PW:         1.20 cm  LV E/e' medial:  17.0 LV IVS:        1.30 cm  LV e' lateral:   3.81 cm/s LVOT diam:     2.50 cm  LV E/e' lateral: 25.2 LV SV:         42 LV SV Index:   16 LVOT Area:     4.91 cm  RIGHT VENTRICLE RV Basal diam:  3.60 cm RV S prime:     5.98 cm/s LEFT ATRIUM              Index       RIGHT ATRIUM           Index LA diam:  4.80 cm  1.88 cm/m  RA Area:     22.40 cm LA Vol (A2C):   141.0 ml 55.27 ml/m RA Volume:   71.00 ml  27.83 ml/m LA Vol (A4C):   135.0 ml 52.92 ml/m LA Biplane Vol: 147.0 ml 57.62 ml/m  AORTIC VALVE LVOT Vmax:   64.70 cm/s LVOT Vmean:  41.200 cm/s LVOT VTI:    0.086 m  AORTA Ao Root diam: 3.20 cm MITRAL VALVE               TRICUSPID VALVE MV Area (PHT): 7.44 cm    TR Peak grad:   23.4 mmHg MV Decel Time: 102 msec    TR Vmax:        242.00 cm/s MV E velocity: 96.10 cm/s MV A velocity: 49.20 cm/s  SHUNTS MV E/A ratio:  1.95        Systemic VTI:  0.09 m                            Systemic Diam: 2.50 cm Oswaldo Milian MD Electronically signed by Oswaldo Milian MD Signature Date/Time: 04/30/2021/3:45:52 PM    Final    ECHO  TEE  Result Date: 04/30/2021    TRANSESOPHOGEAL ECHO REPORT   Patient Name:   Jeffrey [name] Date of Exam: 04/30/2021 Medical Rec #:  [id number]    Height:       68.0 in Accession #:    [id number]   Weight:       337.0 lb Date of Birth:  [birth year]    BSA:          2.551 m Patient Age:    74 years     BP:           135/103 mmHg Patient Gender: M            HR:           155 bpm. Exam Location:  Inpatient Procedure: Transesophageal Echo Indications:     I48.0 Paroxysmal atrial fibrillation  History:         Patient has prior history of Echocardiogram examinations, most                  recent 06/06/2020. Risk Factors:Hypertension, Diabetes and Sleep                  Apnea. Renal cell carcinoma s/p nephrectomy. Chronic kidney                  disease. Acute on Chronic Systolic Heart Failure w/ Low Output.                  Elevated LFTs.  Sonographer:     Darlina Sicilian RDCS Referring Phys:  [place] Diagnosing Phys: Loralie Champagne MD  Sonographer Comments: Only mid esophageal images of the left atrial appendage were aquired due to respiratory depression during the procedure. PROCEDURE: After discussion of the risks and benefits of a TEE, an informed consent was obtained from the patient. TEE procedure time was 1 minutes. The transesophogeal probe was passed without difficulty through the esophogus of the patient. Imaged were  obtained with the patient in a upright position. Sedation performed by different physician. The patient was monitored while under deep sedation. Anesthestetic sedation was provided intravenously by Anesthesiology: 80.'58mg'$  of Propofol. Image quality was technically difficult. The patient developed Respiratory depression during the procedure. A successful direct current cardioversion  was performed at 200 joules with 1 attempt. Ketamine injection 10 mg/mL 30 mg.  Loralie Champagne MD Electronically signed by Signature Date/Time: /    Preliminary    VAS US DOPPLER PRE VAD  Result Date:  05/13/2021 PERIOPERATIVE VASCULAR EVALUATION Patient Name:  Jeffrey [name]  Date of Exam:   05/13/2021 Medical Rec #: [id number]     Accession #:    [id number] Date of Birth: May 28, 1976     Patient Gender: M Patient Age:   53 years Exam Location:  [facility] Procedure:      VAS US DOPPLER PRE VAD Referring Phys: Glori Bickers --------------------------------------------------------------------------------  Indications:      Pre-surgical evaluation. Risk Factors:     Hypertension, hyperlipidemia, Diabetes, current smoker. Limitations:      Bandaging Comparison Study: No prior study Performing Technologist: Maudry Mayhew MHA, RVT, RDCS, RDMS  Examination Guidelines: A complete evaluation includes B-mode imaging, spectral Doppler, color Doppler, and power Doppler as needed of all accessible portions of each vessel. Bilateral testing is considered an integral part of a complete examination. Limited examinations for reoccurring indications may be performed as noted.  Right Carotid Findings: +----------+--------+-------+--------+--------------------------------+--------+           PSV cm/sEDV    StenosisDescribe                        Comments                   cm/s                                                    +----------+--------+-------+--------+--------------------------------+--------+ CCA Prox  59      10                                                      +----------+--------+-------+--------+--------------------------------+--------+ CCA Distal80      18                                                      +----------+--------+-------+--------+--------------------------------+--------+ ICA Prox  36      11             focal, irregular and                                                      heterogenous                             +----------+--------+-------+--------+--------------------------------+--------+ ICA Distal31      11                                                       +----------+--------+-------+--------+--------------------------------+--------+  ECA       106     14                                                      +----------+--------+-------+--------+--------------------------------+--------+ +----------+--------+-------+----------------+------------+           PSV cm/sEDV cmsDescribe        Arm Pressure +----------+--------+-------+----------------+------------+ Subclavian76             Multiphasic, WNL             +----------+--------+-------+----------------+------------+ +---------+--------+--+--------+-+---------+ VertebralPSV cm/s19EDV cm/s7Antegrade +---------+--------+--+--------+-+---------+ Left Carotid Findings: +----------+--------+--------+--------+--------+--------+           PSV cm/sEDV cm/sStenosisDescribeComments +----------+--------+--------+--------+--------+--------+ CCA Prox  74      18                               +----------+--------+--------+--------+--------+--------+ CCA Distal75                                       +----------+--------+--------+--------+--------+--------+ ICA Prox  56      15                               +----------+--------+--------+--------+--------+--------+ ICA Distal49      19                               +----------+--------+--------+--------+--------+--------+ ECA       67                                       +----------+--------+--------+--------+--------+--------+ +----------+--------+--------+------------+------------+ SubclavianPSV cm/sEDV cm/sDescribe    Arm Pressure +----------+--------+--------+------------+------------+                           Not assessed97           +----------+--------+--------+------------+------------+ +---------+--------+--+--------+-+---------+ VertebralPSV cm/s27EDV cm/s9Antegrade +---------+--------+--+--------+-+---------+  ABI Findings:  +--------+------------------+-----+---------+----------------------------------+ Right   Rt Pressure (mmHg)IndexWaveform Comment                            +--------+------------------+-----+---------+----------------------------------+ Brachial                       triphasicPressure not obtained due to                                               recent catheterization             +--------+------------------+-----+---------+----------------------------------+ PTA     112               1.15 triphasic                                   +--------+------------------+-----+---------+----------------------------------+ DP      111  1.14 triphasic                                   +--------+------------------+-----+---------+----------------------------------+ +--------+------------------+-----+---------+-------+ Left    Lt Pressure (mmHg)IndexWaveform Comment +--------+------------------+-----+---------+-------+ [id number]                     triphasic        +--------+------------------+-----+---------+-------+ PTA     110               1.13 triphasic        +--------+------------------+-----+---------+-------+ DP      104               1.07 triphasic        +--------+------------------+-----+---------+-------+ +-------+---------------+----------------+ ABI/TBIToday's ABI/TBIPrevious ABI/TBI +-------+---------------+----------------+ Right  1.15                            +-------+---------------+----------------+ Left   1.13                            +-------+---------------+----------------+  Summary: Right Carotid: The extracranial vessels were near-normal with only minimal wall                thickening or plaque. Left Carotid: The extracranial vessels were near-normal with only minimal wall               thickening or plaque. Vertebrals:  Bilateral vertebral arteries demonstrate antegrade flow. Subclavians: Left subclavian artery was  not visualized. Normal flow hemodynamics              were seen in the right subclavian artery.  *See table(s) above for measurements and observations. Right ABI: Resting right ankle-brachial index is within normal range. No evidence of significant right lower extremity arterial disease. Left ABI: Resting left ankle-brachial index is within normal range. No evidence of significant left lower extremity arterial disease.  Electronically signed by Ruta Hinds MD on 05/13/2021 at 5:22:27 PM.    Final    VAS [place] LOWER EXTREMITY VENOUS (DVT)  Result Date: 05/13/2021  Lower Venous DVT Study Patient Name:  Jeffrey [name]  Date of Exam:   05/13/2021 Medical Rec #: [id number]     Accession #:    [id number] Date of Birth: Aug 23, 1976     Patient Gender: M Patient Age:   27 years Exam Location:  [facility] Procedure:      VAS [place] LOWER EXTREMITY VENOUS (DVT) Referring Phys: Quillian Quince BENSIMHON --------------------------------------------------------------------------------  Indications: Pre-op.  Comparison Study: No prior study Performing Technologist: Maudry Mayhew MHA, RDMS, RVT, RDCS  Examination Guidelines: A complete evaluation includes B-mode imaging, spectral Doppler, color Doppler, and power Doppler as needed of all accessible portions of each vessel. Bilateral testing is considered an integral part of a complete examination. Limited examinations for reoccurring indications may be performed as noted. The reflux portion of the exam is performed with the patient in reverse Trendelenburg.  +---------+---------------+---------+-----------+----------+--------------+ RIGHT    CompressibilityPhasicitySpontaneityPropertiesThrombus Aging +---------+---------------+---------+-----------+----------+--------------+ CFV      Full           Yes      Yes                                 +---------+---------------+---------+-----------+----------+--------------+ SFJ  Full                                                         +---------+---------------+---------+-----------+----------+--------------+ FV Prox  Full                                                        +---------+---------------+---------+-----------+----------+--------------+ FV Mid   Full                                                        +---------+---------------+---------+-----------+----------+--------------+ FV DistalFull                                                        +---------+---------------+---------+-----------+----------+--------------+ PFV      Full                                                        +---------+---------------+---------+-----------+----------+--------------+ POP      Full           Yes      Yes                                 +---------+---------------+---------+-----------+----------+--------------+ PTV      Full                                                        +---------+---------------+---------+-----------+----------+--------------+ PERO     Full                                                        +---------+---------------+---------+-----------+----------+--------------+   +---------+---------------+---------+-----------+----------+--------------+ LEFT     CompressibilityPhasicitySpontaneityPropertiesThrombus Aging +---------+---------------+---------+-----------+----------+--------------+ CFV      Full           Yes      Yes                                 +---------+---------------+---------+-----------+----------+--------------+ SFJ      Full                                                        +---------+---------------+---------+-----------+----------+--------------+  FV Prox  Full                                                        +---------+---------------+---------+-----------+----------+--------------+ FV Mid   Full                                                         +---------+---------------+---------+-----------+----------+--------------+ FV DistalFull                                                        +---------+---------------+---------+-----------+----------+--------------+ PFV      Full                                                        +---------+---------------+---------+-----------+----------+--------------+ POP      Full           Yes      Yes                                 +---------+---------------+---------+-----------+----------+--------------+ PTV      Full                                                        +---------+---------------+---------+-----------+----------+--------------+   Left Technical Findings: Not visualized segments include peroneal veins.   Summary: RIGHT: - There is no evidence of deep vein thrombosis in the lower extremity.  - No cystic structure found in the popliteal fossa.  LEFT: - There is no evidence of deep vein thrombosis in the lower extremity. However, portions of this examination were limited- see technologist comments above.  - No cystic structure found in the popliteal fossa.  *See table(s) above for measurements and observations. Electronically signed by Ruta Hinds MD on 05/13/2021 at 5:22:06 PM.    Final    [place] EKG SITE RITE  Result Date: 04/30/2021 If Site Rite image not attached, placement could not be confirmed due to current cardiac rhythm.  CT MAXILLOFACIAL WO CONTRAST  Result Date: 05/14/2021 CLINICAL DATA:  LVAD workup EXAM: CT MAXILLOFACIAL WITHOUT CONTRAST TECHNIQUE: Multidetector CT imaging of the maxillofacial structures was performed. Multiplanar CT image reconstructions were also generated. COMPARISON:  CT head 02/11/2016 FINDINGS: Osseous: No osseous abnormality.  No fracture or bone lesion No dental infection identified. Negative for caries or periapical lucency. Orbits: No orbital mass or edema. Sinuses: Clear Soft tissues: No soft tissue mass or edema in the face.  Limited intracranial: Negative IMPRESSION: Negative CT face.  No evidence of dental infection. Electronically Signed   By: Franchot Gallo M.D.   On: 05/14/2021 13:47    Labs:  Basic Metabolic Panel: Recent Labs  Lab 05/21/21 0718 05/22/21 0355 05/23/21 0639 05/26/21 0901 05/27/21 0611  NA 134* 135 135 134* 137  K 4.1 3.4* 4.3 3.9 3.8  CL 106 100 105 105 109  CO2 21* 23 21* 20* 22  GLUCOSE 160* 142* 139* 169* 85  BUN 26* 31* 30* 31* 32*  CREATININE 1.58* 1.96* 1.77* 1.69* 1.66*  CALCIUM 9.6 9.6 9.8 9.7 9.7    CBC: Recent Labs  Lab 05/23/21 0639  WBC 7.4  NEUTROABS 4.1  HGB 13.7  HCT 41.5  MCV 82.2  PLT 413*    CBG: Recent Labs  Lab 05/27/21 0555 05/27/21 1203 05/27/21 1658 05/27/21 1743 05/27/21 2026  GLUCAP 90 126* 68* 104* 147*   Family history.  Mother with hypertension maternal aunt with diabetes.  Denies colon cancer pancreatic cancer or stomach cancer  Brief HPI:   Jeffrey [name] is a 45 y.o. right-handed male with history of anxiety, chronic diastolic congestive heart failure childhood asthma chronic systolic congestive heart failure CKD stage II-3 morbid obesity hyperlipidemia hypertension PAF nonischemic cardiomyopathy status post ICD renal cell carcinoma with nephrectomy diabetes mellitus.  Per chart review lives with spouse independent and sedentary.  Presented 04/29/2021 with intermittent nausea as well as shortness of breath and lower extremity edema.  In the ER he was found to be in atrial fibrillation with RVR frequent runs of NSVT.  Denied chest pain.  Creatinine 1.99 from baseline 1.49 troponin 75-64.  Chest x-ray showed mild CHF.  Cardiology services consulted placed on IV diuresis.  Echocardiogram with ejection fraction of less than [id number] grade 3 diastolic dysfunction.  He did undergo emergent TEE guided DCCV 7/27 due to inability to control heart rate.  Not a candidate for ablation due to body habitus.  He had been placed on amiodarone stopped due to concern  for toxicity and adjusted accordingly.  He remained on Eliquis as directed.  Intermittent bouts of orthostasis adjustments per cardiology services.  Hospital course complicated by acute hypoxemic respiratory failure as noted respiratory cultures positive for rare S-aureus treated with linezolid and cefepime.  AKI on CKD creatinine peaked 4.65 felt to be possibly related to diuresis his torsemide was adjusted.  He did have some urinary obstruction likely playing a role in AKI urology service consulted initially with Foley tube placed later removed.  Patient currently awaiting plan for LVAD once stable from a cardiac standpoint.  Patient did have acute gout flareup responding well to prednisone as well as colchicine.  Therapy evaluations completed due to patient decreased functional mobility was admitted for a comprehensive rehab program.   Hospital Course: Jeffrey [name] was admitted to rehab 05/22/2021 for inpatient therapies to consist of PT, ST and OT at least three hours five days a week. Past admission physiatrist, therapy team and rehab RN have worked together to provide customized collaborative inpatient rehab.  Pertain to patient's debility related to acute on chronic systolic congestive heart failure/cardiogenic shock ultimate plan for LVAD.  He continued to be followed closely by cardiology team.  He remained on chronic Eliquis as advised cardiac rate controlled.  Mood stabilization with Zoloft he was attending full therapies.  Cardiac rate controlled amiodarone as advised.  AKI on CKD with creatinine stabilized 1.77.  Pain managed use of tramadol.  Blood sugars controlled insulin therapy as directed.  Close monitoring of blood pressure again recommendations and changes made per cardiology services.  Noted obesity BMI 47 dietary follow-up.   Blood pressures were monitored  on TID basis and soft and monitored  Diabetes has been monitored with ac/hs CBG checks and SSI was use prn for tighter BS control.     Rehab course: During patient's stay in rehab weekly team conferences were held to monitor patient's progress, set goals and discuss barriers to discharge. At admission, patient required minimal guard 140 feet supervision sit to supine  Physical exam.  Blood pressure 107/95 pulse 101 temperature 98.5 respirations 18 oxygen saturation 99% room air Constitutional.  No acute distress HEENT Head.  Normocephalic and atraumatic Eyes.  Pupils round and reactive to light no discharge.nystagmus Neck.  Supple nontender no JVD without thyromegaly Cardiac regular rate rhythm not any extra sounds or murmur heard Abdomen.  Soft nontender positive bowel sounds without rebound Respiratory effort normal no respiratory distress without wheeze Skin.  Intact Neurologic.  Alert oriented x3.  Moves all extremities  He/She  has had improvement in activity tolerance, balance, postural control as well as ability to compensate for deficits. He/She has had improvement in functional use RUE/LUE  and RLE/LLE as well as improvement in awareness.  Patient transfers supine to sit supervision transfers sit to stand with supervision and ambulates without assistive device.  He can increase his ambulation up to 150 feet.  Gather his belongings for activities daily when homemaking.  Oxygen saturation remained greater than 96% on room air.  Full teaching completed plan discharged home       Disposition: Discharged home    Diet: Carb modified  Special Instructions: No driving smoking or alcohol  Medications at discharge 1.  Tylenol as needed 2.  Allopurinol 100 mg p.o. daily 3.  Xanax 0.5 mg p.o. twice daily 4.  Amiodarone 200 mg p.o. twice daily 5.  Eliquis 5 mg p.o. twice daily 6.  Colace 100 mg p.o. twice daily 7.  Jardiance 10 mg p.o. daily 8.  Levemir 50 units twice daily 9.  Cozaar 12.5 mg p.o. nightly 10.  Nitroglycerin as needed 11.  Protonix 40 mg p.o. daily 12.  MiraLAX daily 13.  Zoloft 25 mg p.o.  daily 14.  Aldactone 25 mg p.o. daily 15.  Tramadol 100 mg p.o. every 12 hours as needed 16.  Trazodone 100 mg p.o. nightly  30-35 minutes were spent completing discharge summary and discharge planning  Discharge Instructions     Ambulatory referral to Physical Medicine Rehab   Complete by: As directed    Moderate complexity follow up 1-2 weeks cardiogenic shock/multi medical        Follow-up Information     Raulkar, Clide Deutscher, MD Follow up.   Specialty: Physical Medicine and Rehabilitation Why: 06/23/21 please arrive at 11:20am for 11:40am appointment, thank you! Contact information: [id number] [place] [address] [phone]         Bensimhon, Shaune Pascal, MD Follow up.   Specialty: Cardiology Why: June 03, 2021 at 9:30 AM [facility], Parking Garage Code [id number] Contact information: [address] [place] [address] [phone]                 Signed: Cathlyn Parsons 05/28/2021, 5:40 AM

## 2021-05-27 ENCOUNTER — Ambulatory Visit (INDEPENDENT_AMBULATORY_CARE_PROVIDER_SITE_OTHER): Payer: Self-pay | Admitting: Bariatrics

## 2021-05-27 ENCOUNTER — Other Ambulatory Visit (HOSPITAL_COMMUNITY): Payer: Self-pay

## 2021-05-27 DIAGNOSIS — R570 Cardiogenic shock: Secondary | ICD-10-CM | POA: Diagnosis not present

## 2021-05-27 DIAGNOSIS — I480 Paroxysmal atrial fibrillation: Secondary | ICD-10-CM | POA: Diagnosis not present

## 2021-05-27 LAB — BASIC METABOLIC PANEL
Anion gap: 6 (ref 5–15)
BUN: 32 mg/dL — ABNORMAL HIGH (ref 6–20)
CO2: 22 mmol/L (ref 22–32)
Calcium: 9.7 mg/dL (ref 8.9–10.3)
Chloride: 109 mmol/L (ref 98–111)
Creatinine, Ser: 1.66 mg/dL — ABNORMAL HIGH (ref 0.61–1.24)
GFR, Estimated: 51 mL/min — ABNORMAL LOW (ref >60)
Glucose, Bld: 85 mg/dL (ref 70–99)
Potassium: 3.8 mmol/L (ref 3.5–5.1)
Sodium: 137 mmol/L (ref 135–145)

## 2021-05-27 LAB — GLUCOSE, CAPILLARY
Glucose-Capillary: 90 mg/dL (ref 70–99)
Glucose-Capillary: 126 mg/dL — ABNORMAL HIGH (ref 70–99)
Glucose-Capillary: 68 mg/dL — ABNORMAL LOW (ref 70–99)
Glucose-Capillary: 104 mg/dL — ABNORMAL HIGH (ref 70–99)
Glucose-Capillary: 147 mg/dL — ABNORMAL HIGH (ref 70–99)

## 2021-05-27 MED ORDER — APIXABAN 5 MG PO TABS
5.0000 mg | ORAL_TABLET | Freq: Two times a day (BID) | ORAL | 0 refills | Status: DC
Start: 2021-05-27 — End: 2021-06-26
  Filled 2021-05-27: qty 60, 30d supply, fill #0

## 2021-05-27 MED ORDER — POLYETHYLENE GLYCOL 3350 17 G PO PACK: 17.0000 g | PACK | Freq: Every day | ORAL | 0 refills | Status: DC

## 2021-05-27 MED ORDER — SPIRONOLACTONE 25 MG PO TABS
25.0000 mg | ORAL_TABLET | Freq: Every day | ORAL | 6 refills | Status: DC
  Filled 2021-05-27: qty 30, 30d supply, fill #0
  Filled 2021-06-23 – 2021-08-14 (×3): qty 30, 30d supply, fill #1
  Filled 2021-09-02 – 2021-09-30 (×4): qty 30, 30d supply, fill #2
  Filled 2021-11-03: qty 30, 30d supply, fill #3
  Filled 2021-12-03 – 2021-12-15 (×2): qty 30, 30d supply, fill #4
  Filled 2022-01-27 (×2): qty 30, 30d supply, fill #5
  Filled 2022-03-03: qty 30, 30d supply, fill #6

## 2021-05-27 MED ORDER — DICLOFENAC SODIUM 1 % EX GEL
2.0000 g | Freq: Four times a day (QID) | CUTANEOUS | Status: DC
  Administered 2021-05-27 – 2021-05-28 (×3): 2 g via TOPICAL
  Filled 2021-05-27: qty 100

## 2021-05-27 MED ORDER — ALLOPURINOL 100 MG PO TABS
100.0000 mg | ORAL_TABLET | Freq: Every day | ORAL | 3 refills | Status: DC
  Filled 2021-05-27: qty 90, 90d supply, fill #0
  Filled 2021-09-05: qty 90, 90d supply, fill #1
  Filled 2021-09-12 – 2021-12-26 (×2): qty 90, 90d supply, fill #2
  Filled 2022-03-03 – 2022-04-27 (×3): qty 90, 90d supply, fill #3

## 2021-05-27 MED ORDER — AMIODARONE HCL 200 MG PO TABS
200.0000 mg | ORAL_TABLET | Freq: Every day | ORAL | Status: DC
  Administered 2021-05-28: 200 mg via ORAL
  Filled 2021-05-27: qty 1

## 2021-05-27 MED ORDER — PANTOPRAZOLE SODIUM 40 MG PO TBEC
40.0000 mg | DELAYED_RELEASE_TABLET | Freq: Every day | ORAL | 0 refills | Status: DC
  Filled 2021-05-27: qty 30, 30d supply, fill #0

## 2021-05-27 MED ORDER — SERTRALINE HCL 25 MG PO TABS
25.0000 mg | ORAL_TABLET | Freq: Every day | ORAL | 0 refills | Status: DC
  Filled 2021-05-27: qty 30, 30d supply, fill #0

## 2021-05-27 MED ORDER — EMPAGLIFLOZIN 10 MG PO TABS
ORAL_TABLET | Freq: Every day | ORAL | 3 refills | Status: DC
  Filled 2021-05-27: qty 90, 90d supply, fill #0

## 2021-05-27 MED ORDER — ALPRAZOLAM 0.5 MG PO TABS
0.5000 mg | ORAL_TABLET | Freq: Two times a day (BID) | ORAL | 0 refills | Status: DC
  Filled 2021-05-27: qty 60, 30d supply, fill #0

## 2021-05-27 MED ORDER — TRAZODONE HCL 100 MG PO TABS
100.0000 mg | ORAL_TABLET | Freq: Every day | ORAL | 0 refills | Status: DC
  Filled 2021-05-27: qty 30, 30d supply, fill #0

## 2021-05-27 MED ORDER — LOSARTAN POTASSIUM 25 MG PO TABS
12.5000 mg | ORAL_TABLET | Freq: Every day | ORAL | 0 refills | Status: DC
  Filled 2021-05-27: qty 30, 60d supply, fill #0

## 2021-05-27 MED ORDER — AMIODARONE HCL 200 MG PO TABS
200.0000 mg | ORAL_TABLET | Freq: Every day | ORAL | 0 refills | Status: DC
  Filled 2021-05-27: qty 30, 30d supply, fill #0

## 2021-05-27 MED ORDER — TRAMADOL HCL 50 MG PO TABS: 100.0000 mg | ORAL_TABLET | Freq: Two times a day (BID) | ORAL | Status: DC | PRN

## 2021-05-27 MED ORDER — TRAMADOL HCL 50 MG PO TABS
100.0000 mg | ORAL_TABLET | Freq: Two times a day (BID) | ORAL | 0 refills | Status: DC | PRN
  Filled 2021-05-27: qty 20, 5d supply, fill #0

## 2021-05-27 NOTE — Progress Notes (Signed)
Occupational Therapy Session Note  Patient Details  Name: Jeffrey [name] MRN: [id number] Date of Birth: 1976/09/17  Today's Date: 05/27/2021 OT Individual Time: [id number] and [id number] OT Individual Time Calculation (min): 68 min and 53 min   Short Term Goals: Week 1:  OT Short Term Goal 1 (Week 1): STGs=LTGs due to ELOS  Skilled Therapeutic Interventions/Progress Updates:    1) Treatment session with focus on functional mobility, endurance, energy conservation strategies, and diet modification.  Pt received semi-reclined in bed agreeable to therapy session.  Pt requesting to shower during PM session.  Pt donned shirt, TEDS, and socks/shoes seated EOB Mod I.  Pt completed car transfer Mod I to approximate height of father's [place].  Pt ambulated 200' with RW Mod I and additional 150' after seated rest break.  Discussed household ambulation but not recommending community ambulation at this time due to knee pain and reports of being "winded" with increased distance.  Pt completed 12 steps with 2 rails with supervision.  Pt reports only have 5 steps with 2 rails, can reach both and has ramp to back door that he can use.  Pt required frequent rest breaks between activities due to reports of being "winded".  Discussed energy conservation strategies during ADLs and IADLs to reiterate previous education.  Pt gathered handouts about diet recommendations as he has a personal goal of weight loss.  Pt returned to room and remained upright in w/c with all needs in reach.  2) Treatment session with focus on self-care retraining, functional mobility, and endurance.  Pt completed all bathing/dressing at overall Mod I level this session. Pt ambulated to room bathroom with RW Mod I and completed step in shower transfer Mod I.  Pt completed all bathing and LB dressing at sit > stand level for shower bench. Pt then ambulated back to EOB and completed UB dressing and donning gripper socks seated EOB.  Pt reports pleased  with progress and feels that he has been given resources and education to promote further recovery upon d/c.  Pt returned to supine and left with all needs in reach.  Therapy Documentation Precautions:  Precautions Precautions: Fall Precaution Comments: B knee pain, premorbid lightheadedness (pt thinks related to meds) Restrictions Weight Bearing Restrictions: No General:   Vital Signs: Oxygen Therapy O2 Device: Room Air Pain: Pt with c/o pain in R knee, not rated.   Therapy/Group: Individual Therapy  Simonne Come 05/27/2021, 10:20 AM

## 2021-05-27 NOTE — Progress Notes (Signed)
Inpatient Rehabilitation Care Coordinator Discharge Note   Patient Details  Name: Jeffrey [name] MRN: [id number] Date of Birth: Jul 31, 1976   Discharge location: home with wife who works will have other family memebrs checking on him while she is working  [employer] of Stay:  7 days  Discharge activity level: mod/i level  Home/community participation: yes  Patient response [id number] Literacy - How often do you need to have someone help you when you read instructions, pamphlets, or other written material from your doctor or pharmacy?: Never  Patient response [id number] Isolation - How often do you feel lonely or isolated from those around you?: Never  Services provided included: MD, RD, PT, OT, RN, CM, SW, Pharmacy  Financial Services:  Financial Services Utilized: [place]    Choices offered to/list presented to: pt  Follow-up services arranged:  Outpatient, DME, Patient/Family has no preference for HH/DME agencies    Outpatient Servicies: [facility] at [address]-pt & ot will contact pt to schedule appointments DME : [employer]-bariatric rolling walker and tub bench    Patient response to transportation need: Is the patient able to respond to transportation needs?: Yes In the past 12 months, has lack of transportation kept you from medical appointments or from getting medications?: No In the past 12 months, has lack of transportation kept you from meetings, work, or from getting things needed for daily living?: No    Comments (or additional information):pt did well and reached mod/I level goals quickly. Ready to go home  Patient/Family verbalized understanding of follow-up arrangements:  Yes  Individual responsible for coordination of the follow-up plan: kameka-wife  Confirmed correct DME delivered: Elease Hashimoto 05/27/2021    Elease Hashimoto

## 2021-05-27 NOTE — Progress Notes (Signed)
Physical Therapy Discharge Summary  Patient Details  Name: Jeffrey [name] MRN: [id number] Date of Birth: 11/27/1975  Patient has met 5 of 8 long term goals due to improved activity tolerance, improved balance, improved postural control, increased strength, improved awareness, and improved coordination. Patient to discharge at an ambulatory level Modified Independent. Pt has verbalized and demonstrated confidence with all tasks to ensure safe discharge home.   Reasons goals not met: Pt did not meet floor transfer goal of supervision as pt is currently unsafe to perform floor transfers due to high levels of pain in R knee due to history if arthritis, body habitus, and generalized deconditioning. Pt also did not meet stair goal of 5 steps mod I as pt continues to require supervision due to cardiovascular and balance deficits. Pt did not meet ambulation goal of 600ft mod I as pt is only able to ambulate up to 200ft mod I with RW prior to resting due to deficits in aerobic capacity.   Recommendation:  Patient will benefit from ongoing skilled PT services in outpatient setting to continue to advance safe functional mobility, address ongoing impairments in generalized strengthening, dynamic standing balance/coordination, gait training, NMR, endurance, and to minimize fall risk.  Equipment: Bariatric RW  Reasons for discharge: treatment goals met  Patient/family agrees with progress made and goals achieved: Yes  PT Discharge Precautions/Restrictions Precautions Precautions: Fall Restrictions Weight Bearing Restrictions: No Pain Interference Pain Interference Pain Effect on Sleep: 1. Rarely or not at all Pain Interference with Therapy Activities: 0. Does not apply - I have not received rehabilitationtherapy in the past 5 days Pain Interference with Day-to-Day Activities: 1. Rarely or not at all Cognition Overall Cognitive Status: Within Functional Limits for tasks assessed Arousal/Alertness:  Awake/alert Orientation Level: Oriented X4 Memory: Appears intact Awareness: Appears intact Problem Solving: Appears intact Safety/Judgment: Appears intact Sensation Sensation Light Touch: Appears Intact Proprioception: Appears Intact Coordination Gross Motor Movements are Fluid and Coordinated: Yes Fine Motor Movements are Fluid and Coordinated: Yes Coordination and Movement Description: generalized weakness and deconditioning Finger Nose Finger Test: WFL bilaterally Heel Shin Test: WFL bilaterally Motor  Motor Motor: Within Functional Limits Motor - Skilled Clinical Observations: generalized weakness and deconditioning  Mobility Bed Mobility Bed Mobility: Rolling Right;Rolling Left;Supine to Sit;Sit to Supine Rolling Right: Independent with assistive device Rolling Left: Independent with assistive device Supine to Sit: Independent with assistive device Sit to Supine: Independent with assistive device Transfers Transfers: Sit to Stand;Stand to Sit;Stand Pivot Transfers Sit to Stand: Independent with assistive device Stand to Sit: Independent with assistive device Stand Pivot Transfers: Independent with assistive device Transfer (Assistive device): Rolling walker Locomotion  Gait Ambulation: Yes Gait Assistance: Independent with assistive device Gait Distance (Feet): 200 Feet Assistive device: Rolling walker Gait Gait: Yes Gait Pattern: Impaired Gait Pattern: Step-through pattern;Decreased step length - right;Decreased step length - left;Decreased stride length;Poor foot clearance - right;Poor foot clearance - left;Wide base of support;Decreased trunk rotation Gait velocity: decreased Stairs / Additional Locomotion Stairs: Yes Stairs Assistance: Supervision/Verbal cueing Stair Management Technique: Two rails Number of Stairs: 12 Height of Stairs: 6 Ramp: Supervision/Verbal cueing (RW) Pick up small object from the floor assist level: Supervision/Verbal cueing Pick  up small object from the floor assistive device: bottle using RW Wheelchair Mobility Wheelchair Mobility: No  Trunk/Postural Assessment  Cervical Assessment Cervical Assessment: Within Functional Limits Thoracic Assessment Thoracic Assessment: Within Functional Limits Lumbar Assessment Lumbar Assessment: Exceptions to WFL (anterior pelvic tilt due to body habitus) Postural Control Postural Control: Deficits   on evaluation  Balance Balance Balance Assessed: Yes Static Sitting Balance Static Sitting - Balance Support: Feet supported;Bilateral upper extremity supported Static Sitting - Level of Assistance: 7: Independent Dynamic Sitting Balance Dynamic Sitting - Balance Support: Feet supported;No upper extremity supported Dynamic Sitting - Level of Assistance: 7: Independent Static Standing Balance Static Standing - Balance Support: Bilateral upper extremity supported (RW) Static Standing - Level of Assistance: 6: Modified independent (Device/Increase time) Dynamic Standing Balance Dynamic Standing - Balance Support: Bilateral upper extremity supported (RW) Dynamic Standing - Level of Assistance: 6: Modified independent (Device/Increase time) Extremity Assessment  RLE Assessment RLE Assessment: Within Functional Limits General Strength Comments: grossly generalized to 5/5; mild pain in R knee LLE Assessment LLE Assessment: Within Functional Limits General Strength Comments: grossly generalized to 5/5   Alfonse Alpers PT, DPT  05/27/2021, 7:45 AM

## 2021-05-27 NOTE — Patient Care Conference (Signed)
Inpatient RehabilitationTeam Conference and Plan of Care Update Date: 05/27/2021   Time: 13:40 PM    Patient Name: Jeffrey [name]      Medical Record Number: [id number]  Date of Birth: May 13, 1976 Sex: Male         Room/Bed: [id number] Payor Info: Payor: [place] / Plan: [place] MEDICAID HE[place] BLUE / Product Type: *No Product type* /    Admit Date/Time:  05/22/2021  4:41 PM  Primary Diagnosis:  <principal problem not specified>  Hospital Problems: Active Problems:   Cardiogenic shock [facility])    Expected Discharge Date: Expected Discharge Date: 05/28/21  Team Members Present: Physician leading conference: Dr. Leeroy Cha Social Worker Present: Ovidio Kin, LCSW Nurse Present: Dorien Chihuahua, RN PT Present: Becky Sax, PT OT Present: Simonne Come, OT PPS Coordinator present : Ileana Ladd, PT     Current Status/Progress Goal Weekly Team Focus  Bowel/Bladder             Swallow/Nutrition/ Hydration             ADL's   Supervision overall with RW and tub/shower bench  Mod I  ADL retraining, energy conservation and activity tolerance, IADLs, functional mobility, d/c planning   Mobility   bed mobility supervision, transfers with RW supervision, gait >140f with RW supervision, 8 steps 2 rails supervision  Mod I/supervision  functional mobility/transfers, generalized strengthening, dynamic standing balance/coordination, D/C planning, endurance   Communication             Safety/Cognition/ Behavioral Observations            Pain             Skin               Discharge Planning:  Home with wife and children-will be alone while wife works but is mod/i level   Team Discussion: Knee pain addressed and BP controlled per MD.  Patient on target to meet rehab goals: yes, currently mod I overall, supervision for steps and shower transfers and patient has his equipment. Goals for discharge set at mod I overall.  *See Care Plan and progress notes for  long and short-term goals.   Revisions to Treatment Plan:    Teaching Needs: Safety, medications, dietary modifications, etc  Current Barriers to Discharge: Decreased caregiver support and Home enviroment access/layout  Possible Resolutions to Barriers: Family education OP PT follow up services     Medical Summary Current Status: occasional cough, knee pain, hypertension CHF, diabetes  Barriers to Discharge: Medical stability;Weight  Barriers to Discharge Comments: occasional cough, knee pain, hypertension CHF, diabetes Possible Resolutions to [employer]Focus: continue guaneficin, incentive spirometer, voltaren gel, monitoring weights, dietary education   Continued Need for Acute Rehabilitation Level of Care: The patient requires daily medical management by a physician with specialized training in physical medicine and rehabilitation for the following reasons: Direction of a multidisciplinary physical rehabilitation program to maximize functional independence : Yes Medical management of patient stability for increased activity during participation in an intensive rehabilitation regime.: Yes Analysis of laboratory values and/or radiology reports with any subsequent need for medication adjustment and/or medical intervention. : Yes   I attest that I was present, lead the team conference, and concur with the assessment and plan of the team.   SDorien ChihuahuaB 05/27/2021, 2:09 PM

## 2021-05-27 NOTE — Progress Notes (Signed)
Patient has home CPAP set up at bedside. Able to place himself on/off as needed. RT available if assistance needed.

## 2021-05-27 NOTE — Progress Notes (Signed)
PROGRESS NOTE   Subjective/Complaints: C/o right knee pain- voltaren gel prescribed.  Discussed that we were able to reduce Cozaar since BP has been soft.  Cough improved, please continue to encourage IS  ROS: denies pain, no chest pain no nausea vomiting or diarrhea, +right knee pain  Objective:   No results found. No results for input(s): WBC, HGB, HCT, PLT in the last 72 hours. Recent Labs    05/26/21 0901 05/27/21 0611  NA 134* 137  K 3.9 3.8  CL 105 109  CO2 20* 22  GLUCOSE 169* 85  BUN 31* 32*  CREATININE 1.69* 1.66*  CALCIUM 9.7 9.7    Intake/Output Summary (Last 24 hours) at 05/27/2021 1257 Last data filed at 05/27/2021 0729 Gross per 24 hour  Intake --  Output 1200 ml  Net -1200 ml        Physical Exam: Vital Signs Blood pressure 100/82, pulse 82, temperature 99.5 F (37.5 C), temperature source Oral, resp. rate 17, height '5\' 8"'$  (1.727 m), weight (!) 139.3 kg, SpO2 92 %.   General: No acute distress, BMI 46.68 Mood and affect are appropriate Heart: Regular rate and rhythm no rubs murmurs or extra sounds Lungs: Clear to auscultation, breathing unlabored, no rales or wheezes Abdomen: Positive bowel sounds, soft nontender to palpation, nondistended Extremities: No clubbing, cyanosis, or edema  Skin: miliaria pustules on chest , 2 pustules on back  Neurological:     Comments: Patient is alert.  .  Oriented x3 and follows commands.  Moves all extremities, feeds self with bilateral upper extremities. MSK: right knee tender to palpation    Assessment/Plan: 1. Functional deficits which require 3+ hours per day of interdisciplinary therapy in a comprehensive inpatient rehab setting. Physiatrist is providing close team supervision and 24 hour management of active medical problems listed below. Physiatrist and rehab team continue to assess barriers to discharge/monitor patient progress toward functional  and medical goals  Care Tool:  Bathing    Body parts bathed by patient: Right arm, Left arm, Chest, Front perineal area, Abdomen, Buttocks, Right upper leg, Left upper leg, Right lower leg, Left lower leg, Face         Bathing assist Assist Level: Supervision/Verbal cueing     Upper Body Dressing/Undressing Upper body dressing   What is the patient wearing?: Pull over shirt    Upper body assist Assist Level: Independent    Lower Body Dressing/Undressing Lower body dressing      What is the patient wearing?: Pants     Lower body assist Assist for lower body dressing: Supervision/Verbal cueing     Toileting Toileting    Toileting assist Assist for toileting: Supervision/Verbal cueing Assistive Device Comment:  (urinal)   Transfers Chair/bed transfer  Transfers assist     Chair/bed transfer assist level: Independent with assistive device Chair/bed transfer assistive device: Programmer, multimedia   Ambulation assist      Assist level: Independent with assistive device Assistive device: Walker-rolling Max distance: 213f   Walk 10 feet activity   Assist     Assist level: Independent with assistive device Assistive device: Walker-rolling   Walk 50 feet activity  Assist    Assist level: Independent with assistive device Assistive device: Walker-rolling    Walk 150 feet activity   Assist    Assist level: Independent with assistive device Assistive device: Walker-rolling    Walk 10 feet on uneven surface  activity   Assist     Assist level: Supervision/Verbal cueing Assistive device: Walker-rolling   Wheelchair     Assist Is the patient using a wheelchair?: No Type of Wheelchair: Manual Wheelchair activity did not occur: N/A  Wheelchair assist level: Supervision/Verbal cueing Max wheelchair distance: 150    Wheelchair 50 feet with 2 turns activity    Assist    Wheelchair 50 feet with 2 turns activity  did not occur: N/A   Assist Level: Supervision/Verbal cueing   Wheelchair 150 feet activity     Assist  Wheelchair 150 feet activity did not occur: N/A   Assist Level: Supervision/Verbal cueing   Blood pressure 100/82, pulse 82, temperature 99.5 F (37.5 C), temperature source Oral, resp. rate 17, height '5\' 8"'$  (1.727 m), weight (!) 139.3 kg, SpO2 92 %.  Medical Problem List and Plan: 1.  Debility secondary to acute on chronic systolic congestive heart failure/cardiogenic and ultimate plan for LVAD             -patient may shower             -ELOS/Goals: 5-7 days modI             Continue CIR  -Interdisciplinary Team Conference today   2.  Antithrombotics: -DVT/anticoagulation: Eliquis [employer]: Other (comment)             -antiplatelet therapy: N/A 3. Right knee pain: decrease Tramadol to daily PRN. Prescribed voltaren gel.  4. Depression: Continue Zoloft 25 mg daily 5. Neuropsych: This patient is capable of making decisions on his own behalf. 6. Skin/Wound Care: Routine skin checks, miliaria, control room temp sweating , apply powder 7. Fluids/Electrolytes/Nutrition: Routine in and outs with follow-up chemistries 8.  PAF.  Continue Eliquis.  Amiodarone 200 mg twice daily.  Patient underwent emergent TEE guided DCCV 7/27.  Cardiac rate controlled. 9.  Acute hypoxemic respiratory failure.  Resolved.  Antibiotic therapy completed. 10.  AKI on CKD stage III.  Follow-up chemistries. Discussed that creatinine is improving.  11.  OSA.  CPAP 12.  Gout.  Prednisone colchicine completed.  Conitnue allopurinol 13.  Diabetes mellitus.  Continue Jardiance 10 mg daily, NovoLog 6 units 3 times daily, Levemir 50 units twice daily.  Diabetic teaching. Provided list of great foods for diabetes.  14.  Hypertension.  Currently hypotensive, decrease Cozaar to 12.'5mg'$ . Monitor with increased mobility 15.  Acute exacerbation CHF.  Aldactone 25 mg daily, Demadex 40 mg Monday Wednesday Friday.   No signs of fluid overload, tolerating therapy well 16.  Obesity.  BMI 47.  Dietary follow-up 17. Insomnia: continue trazodone '100mg'$  HS 18. Anxiety: continue Xanax 0.'5mg'$  BID 19. Disposition: HFU scheduled   20.  Occasional cough, no fever, recently treated for pneumonia with IV antibiotics, lung exam unremarkable.  Will treat symptomatically at this point.  Will need incentive spirometer given history of atelectasis.  LOS: 5 days A FACE TO FACE EVALUATION WAS PERFORMED  Jeffrey [name] 05/27/2021, 12:57 PM

## 2021-05-27 NOTE — Progress Notes (Signed)
Occupational Therapy Discharge Summary  Patient Details  Name: YOSTIN MALACARA MRN: [id number] Date of Birth: Nov 21, 1975   Patient has met 9 of 9 long term goals due to improved activity tolerance, improved balance, postural control, and ability to compensate for deficits.  Patient to discharge at overall Modified Independent level.  Patient's care partner is independent to provide the necessary  intermittent  assistance at discharge.  Patient's wife works during the day, however when at work pt plans to be either at his house with his mom present or at his mother's home to allow for intermittent assistance.  Reasons goals not met: N/A  Recommendation:  Patient will not require follow up OT at this time.  Equipment: Tub transfer bench  Reasons for discharge: treatment goals met and discharge from hospital  Patient/family agrees with progress made and goals achieved: Yes  OT Discharge Precautions/Restrictions  Precautions Precautions: Fall General   Vital Signs Therapy Vitals Temp: 98.2 F (36.8 C) Temp Source: Oral Pulse Rate: 100 Resp: 17 BP: 125/83 Patient Position (if appropriate): Sitting Oxygen Therapy SpO2: 97 % O2 Device: Room Air Pain  Pt with c/o pain in R knee, not rated. ADL ADL Eating: Independent Grooming: Modified independent Where Assessed-Grooming: Standing at sink Upper Body Bathing: Modified independent Where Assessed-Upper Body Bathing: Shower Lower Body Bathing: Modified independent Where Assessed-Lower Body Bathing: Shower Upper Body Dressing: Modified independent (Device) Where Assessed-Upper Body Dressing: Edge of bed Lower Body Dressing: Modified independent Where Assessed-Lower Body Dressing: Edge of bed, Other (Comment) Toileting: Modified independent Where Assessed-Toileting: Risk analyst Method: Counselling psychologist:  (RW) Social research officer, government: Best boy Method: Heritage manager: Radio broadcast assistant, Grab bars Vision Baseline Vision/History: 0 No visual deficits (reports doesn't wear glasses, but per pt "I need to") Patient Visual Report: No change from baseline Vision Assessment?: No apparent visual deficits Perception  Perception: Within Functional Limits Praxis Praxis: Intact Cognition Overall Cognitive Status: Within Functional Limits for tasks assessed Arousal/Alertness: Awake/alert Orientation Level: Oriented X4 Year: 2022 Month: August Day of Week: Correct Attention: Selective Selective Attention: Appears intact Memory: Appears intact Immediate Memory Recall: Sock;Blue;Bed Memory Recall Sock: Without Cue Memory Recall Blue: With Cue Memory Recall Bed: Without Cue Awareness: Appears intact Problem Solving: Appears intact Safety/Judgment: Appears intact Sensation Sensation Light Touch: Appears Intact Proprioception: Appears Intact Coordination Gross Motor Movements are Fluid and Coordinated: Yes Fine Motor Movements are Fluid and Coordinated: Yes Coordination and Movement Description: generalized weakness and deconditioning Finger Nose Finger Test: [facility] bilaterally Heel Shin Test: [facility] bilaterally Motor  Motor Motor: Within Functional Limits Motor - Skilled Clinical Observations: generalized weakness and deconditioning Mobility  Bed Mobility Bed Mobility: Rolling Right;Rolling Left;Supine to Sit;Sit to Supine Rolling Right: Independent with assistive device Rolling Left: Independent with assistive device Supine to Sit: Independent with assistive device Sit to Supine: Independent with assistive device Transfers Sit to Stand: Independent with assistive device Stand to Sit: Independent with assistive device  Trunk/Postural Assessment  Cervical Assessment Cervical Assessment: Within Functional Limits Thoracic Assessment Thoracic Assessment: Within Functional  Limits Lumbar Assessment Lumbar Assessment: Exceptions to [facility] (anterior pelvic tilt due to body habitus) Postural Control Postural Control: Deficits on evaluation  Balance Balance Balance Assessed: Yes Static Sitting Balance Static Sitting - Balance Support: Feet supported;Bilateral upper extremity supported Static Sitting - Level of Assistance: 7: Independent Dynamic Sitting Balance Dynamic Sitting - Balance Support: Feet supported;No upper extremity supported Dynamic Sitting - Level of  Assistance: 7: Independent Static Standing Balance Static Standing - Balance Support: Bilateral upper extremity supported (RW) Static Standing - Level of Assistance: 6: Modified independent (Device/Increase time) Dynamic Standing Balance Dynamic Standing - Balance Support: Bilateral upper extremity supported (RW) Dynamic Standing - Level of Assistance: 6: Modified independent (Device/Increase time) Extremity/Trunk Assessment RUE Assessment RUE Assessment: Within Functional Limits Active Range of Motion (AROM) Comments: WNL General Strength Comments: 4+/5 distally, 4/5 proximally LUE Assessment LUE Assessment: Within Functional Limits Active Range of Motion (AROM) Comments: WNL General Strength Comments: 4+/5 distally, 4/5 proximally   Suella Cogar, [facility] 05/27/2021, 3:18 PM

## 2021-05-27 NOTE — Progress Notes (Signed)
Patient ID: Jeffrey [name], male   DOB: [birth year], 45 y.o.   MRN: [id number] Follow up with the patient regarding medications and pending discharge. Patient feels comfortable with his medications and dietary modifications and feels ready for discharge. Margarito Liner, RN

## 2021-05-27 NOTE — Progress Notes (Addendum)
Advanced Heart Failure Rounding Note  PCP-Cardiologist: Glori Bickers, MD   Subjective:    Admitted to CIR on 08/18  Doing well. Making progress w/ PT. Feels stronger and comfortable returning home tomorrow.   Stable from CHF standpoint. Wt stable. SBPs remain soft, 97-100.   Scr stable at 1.7  Noted pink tinged urine yesterday x 1 but no recurrence. No dysuria.    Objective:   Weight Range: (!) 139.3 kg Body mass index is 46.68 kg/m.   Vital Signs:   Temp:  [98.2 F (36.8 C)-99.5 F (37.5 C)] 99.5 F (37.5 C) (08/23 0559) Pulse Rate:  [82-89] 82 (08/23 0559) Resp:  [16-17] 17 (08/23 0559) BP: (97-103)/(80-82) 100/82 (08/23 0559) SpO2:  [90 %-96 %] 92 % (08/23 0559) Weight:  [139.3 kg] 139.3 kg (08/23 0500) Last BM Date: 05/26/21  Weight change: Filed Weights   05/25/21 2124 05/26/21 0728 05/27/21 0500  Weight: (!) 143.6 kg (!) 139.3 kg (!) 139.3 kg    Intake/Output:   Intake/Output Summary (Last 24 hours) at 05/27/2021 0836 Last data filed at 05/27/2021 0729 Gross per 24 hour  Intake 700 ml  Output 1200 ml  Net -500 ml      Physical Exam    General:  Well appearing, obese. No respiratory difficulty HEENT: normal Neck: supple. no JVD. Carotids 2+ bilat; no bruits. No lymphadenopathy or thyromegaly appreciated. Cor: PMI nondisplaced. Regular rate & rhythm. No rubs, gallops or murmurs. Lungs: clear Abdomen: obese soft, nontender, nondistended. No hepatosplenomegaly. No bruits or masses. Good bowel sounds. Extremities: no cyanosis, clubbing, rash, edema Neuro: alert & oriented x 3, cranial nerves grossly intact. moves all 4 extremities w/o difficulty. Affect pleasant.  Telemetry   N/A   EKG    No new ECG for review  Labs    CBC No results for input(s): WBC, NEUTROABS, HGB, HCT, MCV, PLT in the last 72 hours.  Basic Metabolic Panel Recent Labs    05/26/21 0901 05/27/21 0611  NA 134* 137  K 3.9 3.8  CL 105 109  CO2 20* 22  GLUCOSE  169* 85  BUN 31* 32*  CREATININE 1.69* 1.66*  CALCIUM 9.7 9.7    Liver Function Tests No results for input(s): AST, ALT, ALKPHOS, BILITOT, PROT, ALBUMIN in the last 72 hours.  No results for input(s): LIPASE, AMYLASE in the last 72 hours. Cardiac Enzymes No results for input(s): CKTOTAL, CKMB, CKMBINDEX, TROPONINI in the last 72 hours.  BNP: BNP (last 3 results) Recent Labs    11/21/20 2222 04/29/21 1247  BNP 65.0 1,407.7*    ProBNP (last 3 results) No results for input(s): PROBNP in the last 8760 hours.   D-Dimer No results for input(s): DDIMER in the last 72 hours. Hemoglobin A1C No results for input(s): HGBA1C in the last 72 hours. Fasting Lipid Panel No results for input(s): CHOL, HDL, LDLCALC, TRIG, CHOLHDL, LDLDIRECT in the last 72 hours. Thyroid Function Tests No results for input(s): TSH, T4TOTAL, T3FREE, THYROIDAB in the last 72 hours.  Invalid input(s): FREET3  Other results:   Imaging    No results found.   Medications:     Scheduled Medications:  allopurinol  100 mg Oral Daily   ALPRAZolam  0.5 mg Oral BID   amiodarone  200 mg Oral BID   apixaban  5 mg Oral BID   docusate sodium  100 mg Oral BID   empagliflozin  10 mg Oral Daily   insulin aspart  0-20 Units Subcutaneous TID WC  insulin aspart  6 Units Subcutaneous TID WC   insulin detemir  50 Units Subcutaneous BID   losartan  12.5 mg Oral QHS   nutrition supplement (JUVEN)  1 packet Oral BID BM   nystatin   Topical BID   pantoprazole  40 mg Oral Daily   polyethylene glycol  17 g Oral Daily   sertraline  25 mg Oral Daily   spironolactone  25 mg Oral Daily   traZODone  100 mg Oral QHS    Infusions:   PRN Medications: acetaminophen, guaiFENesin-dextromethorphan, nitroGLYCERIN, sorbitol, traMADol    Assessment/Plan  1. 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  Echo in 9/21 with EF 20-25%.  Echo this admission with EF  < 20%, severe LV dilation, moderate RV dysfunction.   - s/p S-ICD - 7/27: intubated with pulmonary edema and required addition of NE.  - Dobutamine weaned off on 8/3 - Appears euvolemic. Off Torsemide d/t low volume status and worsening creatinine. Can add back as needed.  - Bidil stopped 8/12 due to hypotension - Losartan reduced to 12.5 mg daily d/t hypotension.  - Entresto stopped 08/16 d/t hypotension - Continue spiro 25 mg daily - Continue jardiance 10 mg daily - No BB d/t recent shock - BPs remain too soft for further med titration at this time  - VAD work-up started.    2 Atrial fibrillation: Paroxysmal - Suspect AF with RVR drove initial CHF exacerbation. Emergent TEE-guided DCCV 7/27 due to inability to control HR - Recurrent AF on 8/5. s/p DC-CV on 05/10/21. Rhythm is regular on exam. - Seen by EP. Not a candidate for ablation due to body habitus  (could consider AVN ablation and CRT, if needed)  - Amiodarone initially stopped due to concern for toxicity, but resumed after reversion to rapid afib on 8/5. IV amio off, now on po 200 mg BID. Reduce to 200 mg once daily  - Continue Eliquis 5 mg bid - Keep K> 4 Mg > 2.   3. Acute hypoxemic respiratory failure - Resp culture + for rare S. Aureus (MSSA)  - Treated with linezolid and cefepime. Cefepime switched to Ancef and Linezolid discontinued on 8/3. Ancef stopped 8/7 - Initial concern for amiodarone toxicity -- amio stopped and steroids started, but steroids stopped the next morning on 8/4 -- per CCM team, more likely atelectasis driving hypoxia.  Deferred management to them. CT chest not c/w amio toxicity despite ESR 128 - now stable   4. AKI on CKD stage 3  - Has solitary kidney.   - Suspect cardiorenal syndrome/low output with AF/RVR in setting of cardiomyopathy.   - Baseline creatinine is 1.5-1.7, Creatinine peaked at 4.65. 1.60 -> 1.77 -> 1.96 -> 1.77-1.66 - Holding Torsemide for now - Urinary obstruction likely played a  role. Urology placed foley catheter with improvement in UOP. No residual retention. - Avoid hypotension. Off bidil. BP better after switching Entresto to Losartan and stopping Torsemide.   5. OSA - CPAP    6. Hypokalemia - Resolved - K 3.8    7. Acute gout - Received prednisone and colchicine -> resolved - Continue allopurinol   8. DM2. Poorly controlled - Jardiance - continue SSI - Hgb A1c 9.4 - DM consulted, appreciate input. Blood glucose gradually improved.   9. Debility - HF much improved. Main issue is severe debility.  - Strength improving gradually. Appreciate assistance from CIR. Hopefully he will continue to progress favorably as  suspect he will need VAD in next few months.   Anticipate d/c from CIR tomorrow. Stable from HF standpoint. Appreciate CIR assistance. We will arrange post hospital f/u in the [facility] and will place appt info in AVS   Cardiac Meds for Discharge Amiodarone 200 mg daily  Eliquis 5 mg bid Jardiance 10 mg daily  Losartan 12.5 mg qhs  Spironolactone 25 mg daily  Torsemide 20 mg daily PRN (if wt gain > 3 lb in 24 hr or > 5 lb in 1 week)   Length of Stay: [address], PA-C  05/27/2021, 8:36 AM  Advanced Heart Failure Team Pager [phone] (M-F; 7a - 5p)  Please contact [facility] for night-coverage after hours (5p -7a ) and weekends on [email or web address]   Patient seen and examined with the above-signed Advanced Practice Provider and/or Housestaff. I personally reviewed laboratory data, imaging studies and relevant notes. I independently examined the patient and formulated the important aspects of the plan. I have edited the note to reflect any of my changes or salient points. I have personally discussed the plan with the patient and/or family.  Denies CP or SOB. Getting stronger. Weight and renal function stable. Remains in NSR. SBP 90-100.   General:  Well appearing. No resp difficulty HEENT: normal Neck: supple. no JVD. Carotids 2+  bilat; no bruits. No lymphadenopathy or thryomegaly appreciated. Cor: PMI nondisplaced. Regular rate & rhythm. No rubs, gallops or murmurs. Lungs: clear Abdomen: obese soft, nontender, nondistended. No hepatosplenomegaly. No bruits or masses. Good bowel sounds. Extremities: no cyanosis, clubbing, rash, edema Neuro: alert & orientedx3, cranial nerves grossly intact. moves all 4 extremities w/o difficulty. Affect pleasant  Stable from HF perspective. BP too low to titrate GDMT. Remains in NSR. Renal function ok. Pending d/c tomorrow. Will need close f/u in HF Clinic for timing of VAD implant. Needs to see Dr. Cyndia Bent as outpatient.   Glori Bickers, MD  9:20 AM

## 2021-05-27 NOTE — Progress Notes (Signed)
Physical Therapy Session Note  Patient Details  Name: Jeffrey [name] MRN: [id number] Date of Birth: 09/17/1976  Today's Date: 05/27/2021 PT Individual Time: [id number] and [id number]  PT Individual Time Calculation (min): 39 min and 23 min   Short Term Goals: Week 1:  PT Short Term Goal 1 (Week 1): =LTGs d/t ELOS  Skilled Therapeutic Interventions/Progress Updates:   Treatment Session 1 Received pt sitting in WC, pt agreeable to PT treatment, and reported pain 10/10 in R knee (hx of arthritis). Pt requested to wait until after therapy for medication and determined to work through his pain. Session with emphasis on discharge planning, functional mobility/transfers, generalized strengthening, dynamic standing balance/coordination, ambulation, and improved activity tolerance. Pt transported to/from room in [facility] total A for time management and energy conservation purposes. Pt ambulated 34f on uneven surfaces (ramp and mulch) with RW and supervision and able to stand and pick up bottle from floor with RW and supervision with no LOB noted. Pt ambulated additional 731fx 1 and 12545f 1 mod I with RW back to room with 1 seated rest break prior to getting on elevator. Pt performed the following exercises sitting EOB with supervision and verbal cues for technique: -hip flexion x13 bilaterally -LAQ x12 bilaterally -hip adduction pillow squeezes 13x3 second isometric hold  Pt reported KT applied to R knee had no effect therefore therapist removed tape. Discussed what to do in case of fall but did not practice floor transfer due to arthritis and pain in R knee. Encouraged pt to call [phone] if fall occurs due to safety concerns and risk of making R knee pain worse trying to get up himself. Pt verbalized understanding and in agreement stating that he has a life alert button available at home. Concluded session with pt sitting EOB with all needs within reach and NT present attending to toileting request.   Treatment  Session 2 Received pt sitting in WC, pt agreeable to PT treatment, and continues to report R knee pain but did not specify level. Session with emphasis on functional mobility/transfers, generalized strengthening, dynamic standing balance/coordination, gait training, and improved activity tolerance. Pt performed all sit<>stands mod I with RW throughout session. Pt ambulated [id number] and 150ft13f mod I with RW and increased time. Pt required extensive rest breaks after ambulating due to SOB and fatigue and would continue to benefit from skilled outpatient PT services with emphasis on aerobic capacity and endurance. Briefly discussed energy conservation strategies to implement at home. Pt [facility] back to [facility]. Concluded session with pt sitting in WC, needs within reach, and chair pad alarm on.   Therapy Documentation Precautions:  Precautions Precautions: Fall Precaution Comments: B knee pain, premorbid lightheadedness (pt thinks related to meds) Restrictions Weight Bearing Restrictions: No  Therapy/Group: Individual Therapy Onesty Clair Alfonse AlpersDPT   05/27/2021, 7:24 AM

## 2021-05-27 NOTE — Plan of Care (Signed)
  Problem: RH BOWEL ELIMINATION Goal: RH STG MANAGE BOWEL WITH ASSISTANCE Description: STG Manage Bowel with mod I Assistance. Outcome: Progressing Goal: RH STG MANAGE BOWEL W/MEDICATION W/ASSISTANCE Description: STG Manage Bowel with Medication with mod I Assistance. Outcome: Progressing   Problem: RH BLADDER ELIMINATION Goal: RH STG MANAGE BLADDER WITH ASSISTANCE Description: STG Manage Bladder With  min Assistance Outcome: Progressing   Problem: RH SAFETY Goal: RH STG ADHERE TO SAFETY PRECAUTIONS W/ASSISTANCE/DEVICE Description: STG Adhere to Safety Precautions With cues Assistance/Device. Outcome: Progressing   Problem: RH PAIN MANAGEMENT Goal: RH STG PAIN MANAGED AT OR BELOW PT'S PAIN GOAL Description: At or below level 4 Outcome: Progressing   Problem: RH KNOWLEDGE DEFICIT GENERAL Goal: RH STG INCREASE KNOWLEDGE OF SELF CARE AFTER HOSPITALIZATION Description: Patient will be able to manage self care at discharge using handouts and educational resources independently Outcome: Progressing   Problem: Education: Goal: Ability to demonstrate management of disease process will improve Outcome: Progressing Goal: Individualized Educational Video(s) Outcome: Progressing   Problem: Activity: Goal: Capacity to carry out activities will improve Outcome: Progressing   Problem: Cardiac: Goal: Ability to achieve and maintain adequate cardiopulmonary perfusion will improve Outcome: Progressing   Problem: Consults Goal: RH GENERAL PATIENT EDUCATION Description: See Patient Education module for education specifics. Outcome: Progressing

## 2021-05-28 ENCOUNTER — Other Ambulatory Visit (HOSPITAL_COMMUNITY): Payer: Self-pay

## 2021-05-28 DIAGNOSIS — R570 Cardiogenic shock: Secondary | ICD-10-CM | POA: Diagnosis not present

## 2021-05-28 DIAGNOSIS — I480 Paroxysmal atrial fibrillation: Secondary | ICD-10-CM | POA: Diagnosis not present

## 2021-05-28 LAB — BASIC METABOLIC PANEL
Anion gap: 9 (ref 5–15)
BUN: 29 mg/dL — ABNORMAL HIGH (ref 6–20)
CO2: 22 mmol/L (ref 22–32)
Calcium: 10.3 mg/dL (ref 8.9–10.3)
Chloride: 107 mmol/L (ref 98–111)
Creatinine, Ser: 1.62 mg/dL — ABNORMAL HIGH (ref 0.61–1.24)
GFR, Estimated: 53 mL/min — ABNORMAL LOW (ref >60)
Glucose, Bld: 91 mg/dL (ref 70–99)
Potassium: 4 mmol/L (ref 3.5–5.1)
Sodium: 138 mmol/L (ref 135–145)

## 2021-05-28 LAB — GLUCOSE, CAPILLARY: Glucose-Capillary: 88 mg/dL (ref 70–99)

## 2021-05-28 MED ORDER — DICLOFENAC SODIUM 1 % EX GEL
2.0000 g | Freq: Four times a day (QID) | CUTANEOUS | 0 refills | Status: DC
  Filled 2021-05-28: qty 100, 7d supply, fill #0

## 2021-05-28 MED ORDER — TORSEMIDE 20 MG PO TABS
20.0000 mg | ORAL_TABLET | Freq: Every day | ORAL | 2 refills | Status: DC | PRN
  Filled 2021-05-28: qty 30, 30d supply, fill #0

## 2021-05-28 NOTE — Progress Notes (Signed)
PROGRESS NOTE   Subjective/Complaints: He has no complaints this morning Discussed that we would be sending voltaren gel for him for his right knee pain, discussed that torsemide should be dosed by his weight  ROS: denies pain, no chest pain no nausea vomiting or diarrhea, +right knee pain  Objective:   No results found. No results for input(s): WBC, HGB, HCT, PLT in the last 72 hours. Recent Labs    05/27/21 0611 05/28/21 0602  NA 137 138  K 3.8 4.0  CL 109 107  CO2 22 22  GLUCOSE 85 91  BUN 32* 29*  CREATININE 1.66* 1.62*  CALCIUM 9.7 10.3    Intake/Output Summary (Last 24 hours) at 05/28/2021 0959 Last data filed at 05/28/2021 0719 Gross per 24 hour  Intake 808 ml  Output --  Net 808 ml        Physical Exam: Vital Signs Blood pressure 108/82, pulse 92, temperature 98.3 F (36.8 C), temperature source Oral, resp. rate 16, height '5\' 8"'$  (1.727 m), weight (!) 139.3 kg, SpO2 (!) 89 %.   General: No acute distress, BMI 46.68 Mood and affect are appropriate Heart: Regular rate and rhythm no rubs murmurs or extra sounds Lungs: Clear to auscultation, breathing unlabored, no rales or wheezes. CPAP in place Abdomen: Positive bowel sounds, soft nontender to palpation, nondistended Extremities: No clubbing, cyanosis, or edema  Skin: miliaria pustules on chest , 2 pustules on back  Neurological:     Comments: Patient is alert.  .  Oriented x3 and follows commands.  Moves all extremities, feeds self with bilateral upper extremities. MSK: right knee tender to palpation    Assessment/Plan: 1. Functional deficits which require 3+ hours per day of interdisciplinary therapy in a comprehensive inpatient rehab setting. Physiatrist is providing close team supervision and 24 hour management of active medical problems listed below. Physiatrist and rehab team continue to assess barriers to discharge/monitor patient progress  toward functional and medical goals  Care Tool:  Bathing    Body parts bathed by patient: Right arm, Left arm, Chest, Front perineal area, Abdomen, Buttocks, Right upper leg, Left upper leg, Right lower leg, Left lower leg, Face         Bathing assist Assist Level: Independent with assistive device     Upper Body Dressing/Undressing Upper body dressing   What is the patient wearing?: Pull over shirt    Upper body assist Assist Level: Independent    Lower Body Dressing/Undressing Lower body dressing      What is the patient wearing?: Pants     Lower body assist Assist for lower body dressing: Independent with assitive device     Toileting Toileting    Toileting assist Assist for toileting: Independent with assistive device Assistive Device Comment:  (urinal)   Transfers Chair/bed transfer  Transfers assist     Chair/bed transfer assist level: Independent with assistive device Chair/bed transfer assistive device: Programmer, multimedia   Ambulation assist      Assist level: Independent with assistive device Assistive device: Walker-rolling Max distance: 225f   Walk 10 feet activity   Assist     Assist level: Independent with assistive device  Assistive device: Walker-rolling   Walk 50 feet activity   Assist    Assist level: Independent with assistive device Assistive device: Walker-rolling    Walk 150 feet activity   Assist    Assist level: Independent with assistive device Assistive device: Walker-rolling    Walk 10 feet on uneven surface  activity   Assist     Assist level: Supervision/Verbal cueing Assistive device: Walker-rolling   Wheelchair     Assist Is the patient using a wheelchair?: No Type of Wheelchair: Manual Wheelchair activity did not occur: N/A  Wheelchair assist level: Supervision/Verbal cueing Max wheelchair distance: 150    Wheelchair 50 feet with 2 turns activity    Assist     Wheelchair 50 feet with 2 turns activity did not occur: N/A   Assist Level: Supervision/Verbal cueing   Wheelchair 150 feet activity     Assist  Wheelchair 150 feet activity did not occur: N/A   Assist Level: Supervision/Verbal cueing   Blood pressure 108/82, pulse 92, temperature 98.3 F (36.8 C), temperature source Oral, resp. rate 16, height '5\' 8"'$  (1.727 m), weight (!) 139.3 kg, SpO2 (!) 89 %.  Medical Problem List and Plan: 1.  Debility secondary to acute on chronic systolic congestive heart failure/cardiogenic and ultimate plan for LVAD             -patient may shower             -ELOS/Goals: 5-7 days modI            d/c home today 2.  Antithrombotics: -DVT/anticoagulation: Eliquis [employer]: Other (comment)             -antiplatelet therapy: N/A 3. Right knee pain: decrease Tramadol to daily PRN. Prescribed voltaren gel.  4. Depression: Continue Zoloft 25 mg daily 5. Neuropsych: This patient is capable of making decisions on his own behalf. 6. Skin/Wound Care: Routine skin checks, miliaria, control room temp sweating , apply powder 7. Fluids/Electrolytes/Nutrition: Routine in and outs with follow-up chemistries 8.  PAF.  Continue Eliquis.  Amiodarone 200 mg twice daily.  Patient underwent emergent TEE guided DCCV 7/27.  Cardiac rate controlled. 9.  Acute hypoxemic respiratory failure.  Resolved.  Antibiotic therapy completed. 10.  AKI on CKD stage III.  Follow-up chemistries. Discussed that creatinine is improving.  11.  OSA.  Continue CPAP 12.  Gout.  Prednisone colchicine completed.  Conitnue allopurinol 13.  Diabetes mellitus.  Continue Jardiance 10 mg daily, NovoLog 6 units 3 times daily, Levemir 50 units twice daily.  Diabetic teaching. Provided list of great foods for diabetes.  14.  Hypertension.  Currently hypotensive, decrease Cozaar to 12.'5mg'$ . Monitor with increased mobility 15.  Acute exacerbation CHF.  Aldactone 25 mg daily, Demadex 40 mg Monday  Wednesday Friday.  No signs of fluid overload, tolerating therapy well 16.  Obesity.  BMI 47.  Dietary follow-up 17. Insomnia: continue trazodone '100mg'$  HS 18. Anxiety: continue Xanax 0.'5mg'$  BID 19. Disposition: HFU scheduled   20.  Occasional cough, no fever, recently treated for pneumonia with IV antibiotics, lung exam unremarkable.  Will treat symptomatically at this point.  Will need incentive spirometer given history of atelectasis.   >30 minutes spent in discharge of patient including review of medications and follow-up appointments, physical examination, and in answering all patient's questions   LOS: 6 days A FACE TO [place] 05/28/2021, 9:59 AM

## 2021-05-28 NOTE — Progress Notes (Signed)
INPATIENT REHABILITATION DISCHARGE NOTE   Discharge instructions by: Linna Hoff, PA  Verbalized understanding: yes   Skin care/Wound care: WDL  Pain: 0/10  IV's: none  Tubes/Drains: none  Safety instructions: explained and understood  Patient belongings: sent home with patient and family  Discharged to: private home  Discharged via: wheelchair going home in private car  Notes: All questions answered.

## 2021-05-28 NOTE — Progress Notes (Signed)
Advanced Heart Failure Rounding Note  PCP-Cardiologist: Glori Bickers, MD   Subjective:    Feels good. Denies SOB, orthopnea or PND. Eager to go home. Weight stable SBP 95-120.    Objective:   Weight Range: (!) 139.3 kg Body mass index is 46.68 kg/m.   Vital Signs:   Temp:  [98.2 F (36.8 C)-98.7 F (37.1 C)] 98.3 F (36.8 C) (08/24 0512) Pulse Rate:  [92-100] 92 (08/24 0512) Resp:  [16-17] 16 (08/24 0512) BP: (97-125)/(77-83) 108/82 (08/24 0512) SpO2:  [89 %-97 %] 89 % (08/24 0512) Last BM Date: 05/26/21  Weight change: Filed Weights   05/25/21 2124 05/26/21 0728 05/27/21 0500  Weight: (!) 143.6 kg (!) 139.3 kg (!) 139.3 kg    Intake/Output:   Intake/Output Summary (Last 24 hours) at 05/28/2021 1015 Last data filed at 05/28/2021 0719 Gross per 24 hour  Intake 808 ml  Output --  Net 808 ml       Physical Exam    General:  Well appearing. No resp difficulty HEENT: normal Neck: supple. no JVD. Carotids 2+ bilat; no bruits. No lymphadenopathy or thryomegaly appreciated. Cor: PMI nondisplaced. Regular rate & rhythm. No rubs, gallops or murmurs. Lungs: clear Abdomen: obese soft, nontender, nondistended. No hepatosplenomegaly. No bruits or masses. Good bowel sounds. Extremities: no cyanosis, clubbing, rash, edema Neuro: alert & orientedx3, cranial nerves grossly intact. moves all 4 extremities w/o difficulty. Affect pleasant  Labs    CBC No results for input(s): WBC, NEUTROABS, HGB, HCT, MCV, PLT in the last 72 hours.  Basic Metabolic Panel Recent Labs    05/27/21 0611 05/28/21 0602  NA 137 138  K 3.8 4.0  CL 109 107  CO2 22 22  GLUCOSE 85 91  BUN 32* 29*  CREATININE 1.66* 1.62*  CALCIUM 9.7 10.3     Liver Function Tests No results for input(s): AST, ALT, ALKPHOS, BILITOT, PROT, ALBUMIN in the last 72 hours.  No results for input(s): LIPASE, AMYLASE in the last 72 hours. Cardiac Enzymes No results for input(s): CKTOTAL, CKMB,  CKMBINDEX, TROPONINI in the last 72 hours.  BNP: BNP (last 3 results) Recent Labs    11/21/20 2222 04/29/21 1247  BNP 65.0 1,407.7*     ProBNP (last 3 results) No results for input(s): PROBNP in the last 8760 hours.   D-Dimer No results for input(s): DDIMER in the last 72 hours. Hemoglobin A1C No results for input(s): HGBA1C in the last 72 hours. Fasting Lipid Panel No results for input(s): CHOL, HDL, LDLCALC, TRIG, CHOLHDL, LDLDIRECT in the last 72 hours. Thyroid Function Tests No results for input(s): TSH, T4TOTAL, T3FREE, THYROIDAB in the last 72 hours.  Invalid input(s): FREET3  Other results:   Imaging    No results found.   Medications:     Scheduled Medications:  allopurinol  100 mg Oral Daily   ALPRAZolam  0.5 mg Oral BID   amiodarone  200 mg Oral Daily   apixaban  5 mg Oral BID   diclofenac Sodium  2 g Topical QID   docusate sodium  100 mg Oral BID   empagliflozin  10 mg Oral Daily   insulin aspart  0-20 Units Subcutaneous TID WC   insulin aspart  6 Units Subcutaneous TID WC   insulin detemir  50 Units Subcutaneous BID   losartan  12.5 mg Oral QHS   nutrition supplement (JUVEN)  1 packet Oral BID BM   nystatin   Topical BID   pantoprazole  40 mg  Oral Daily   polyethylene glycol  17 g Oral Daily   sertraline  25 mg Oral Daily   spironolactone  25 mg Oral Daily   traZODone  100 mg Oral QHS    Infusions:   PRN Medications: acetaminophen, guaiFENesin-dextromethorphan, nitroGLYCERIN, sorbitol, traMADol    Assessment/Plan  1. 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  Echo in 9/21 with EF 20-25%.  Echo this admission with EF < 20%, severe LV dilation, moderate RV dysfunction.   - s/p S-ICD - 7/27: intubated with pulmonary edema and required addition of NE.  - Dobutamine weaned off on 8/3 - Appears euvolemic. Off Torsemide d/t low volume status and worsening creatinine. Can add  back as needed.  - Bidil stopped 8/12 due to hypotension - Losartan reduced to 12.5 mg daily d/t hypotension.  - Entresto stopped 08/16 d/t hypotension - Continue spiro 25 mg daily - Continue jardiance 10 mg daily - No BB d/t recent shock - BPs remain too soft for further med titration at this time  - VAD work-up started.  - He looks good. Agree with plan for d/. HF meds as below. Wil arrange outpatient CPX asap. Suspect he will need VAD in near future   2 Atrial fibrillation: Paroxysmal - Suspect AF with RVR drove initial CHF exacerbation. Emergent TEE-guided DCCV 7/27 due to inability to control HR - Recurrent AF on 8/5. s/p DC-CV on 05/10/21. Rhythm is regular on exam. - Seen by EP. Not a candidate for ablation due to body habitus  (could consider AVN ablation and CRT, if needed)  - Amiodarone initially stopped due to concern for toxicity, but resumed after reversion to rapid afib on 8/5. IV amio off, now on po 200 mg BID. Reduce to 200 mg once daily  - Continue Eliquis 5 mg bid - Keep K> 4 Mg > 2. - No bleeding   3. Acute hypoxemic respiratory failure - Resp culture + for rare S. Aureus (MSSA)  - Treated with linezolid and cefepime. Cefepime switched to Ancef and Linezolid discontinued on 8/3. Ancef stopped 8/7 - Initial concern for amiodarone toxicity -- amio stopped and steroids started, but steroids stopped the next morning on 8/4 -- per CCM team, more likely atelectasis driving hypoxia.  Deferred management to them. CT chest not c/w amio toxicity despite ESR 128 - off supplemental O2   4. AKI on CKD stage 3  - Has solitary kidney.   - Suspect cardiorenal syndrome/low output with AF/RVR in setting of cardiomyopathy.   - Baseline creatinine is 1.5-1.7, Creatinine peaked at 4.65. 1.62 today  - Holding Torsemide for now - Urinary obstruction likely played a role. Urology placed foley catheter with improvement in UOP. No residual retention. - Avoid hypotension. Off bidil. BP better  after switching Entresto to Losartan and stopping Torsemide.   5. OSA - CPAP    6. Hypokalemia - Resolved - K 4.0 today   77. Debility - Much improved with CIR support. Appreciate their efforts.    Cardiac Meds for Discharge reviewed with patient and PharmD this am  Amiodarone 200 mg daily  Eliquis 5 mg bid Jardiance 10 mg daily  Losartan 12.5 mg qhs  Spironolactone 25 mg daily  Torsemide 20 mg daily PRN (if wt gain > 3 lb in 24 hr or > 5 lb in 1 week)   Length of Stay: [place], MD  05/28/2021, 10:15 AM  Advanced Heart Failure Team  Pager [phone] (M-F; [place])  Please contact [facility] for night-coverage after hours (5p -7a ) and weekends on [email or web address]

## 2021-05-29 ENCOUNTER — Encounter (HOSPITAL_COMMUNITY): Payer: Medicaid Other | Admitting: Internal Medicine

## 2021-05-29 ENCOUNTER — Other Ambulatory Visit: Payer: Self-pay | Admitting: Obstetrics and Gynecology

## 2021-05-29 ENCOUNTER — Telehealth: Payer: Self-pay

## 2021-05-29 ENCOUNTER — Ambulatory Visit (INDEPENDENT_AMBULATORY_CARE_PROVIDER_SITE_OTHER): Payer: Medicaid Other

## 2021-05-29 DIAGNOSIS — I472 Ventricular tachycardia, unspecified: Secondary | ICD-10-CM

## 2021-05-29 LAB — CUP PACEART REMOTE DEVICE CHECK
Battery Remaining Percentage: 89 %
Date Time Interrogation Session: 20220824181200
Implantable Lead Implant Date: 20160812
Implantable Lead Location: 753862
Implantable Lead Model: 3401
Implantable Pulse Generator Implant Date: 20210812
Pulse Gen Serial Number: 139514

## 2021-05-29 NOTE — Patient Instructions (Signed)
Hi Mr. Wannamaker, I am sorry I missed you today-I hope you are doing okay- as a part of your Medicaid benefit, you are eligible for care management and care coordination services at no cost or copay. I was unable to reach you by phone today but would be happy to help you with your health related needs. Please feel free to call me at [phone].  A member of the Managed Medicaid care management team will reach out to you again over the next 7-14 days.   [name] RN, BSN [place]  [facility] - Managed Medicaid High Risk [phone].

## 2021-05-29 NOTE — Patient Outreach (Signed)
Care Coordination  05/29/2021  TIRELL SHRUM [birth year] [id number]   Medicaid Managed Care   Unsuccessful Outreach Note  05/29/2021 Name: ROMARO FAMA MRN: [id number] DOB: 1976-08-20  Referred by: [name], FNP (Inactive) Reason for referral : High Risk Managed Medicaid (Unsuccessful telephone outreach)   An unsuccessful telephone outreach was attempted today. The patient was referred to the case management team for assistance with care management and care coordination.   Follow Up Plan: A member of the Managed Medicaid care management team will reach out to the patient again over the next 7-14 days.   [name] RN, BSN [place]  [facility] - Managed Medicaid High Risk [phone].

## 2021-05-29 NOTE — Telephone Encounter (Signed)
Transition Care Management Follow-up Telephone Call Date of discharge and from where: 05/28/2021-[facility] How have you been since you were released from the hospital? Patient stated he is doing ok.  Any questions or concerns? No  Items Reviewed: Did the pt receive and understand the discharge instructions provided? Yes  Medications obtained and verified? Yes  Other? No  Any new allergies since your discharge? No  Dietary orders reviewed? N/A Do you have support at home? Yes   Home Care and Equipment/Supplies: Were home health services ordered? not applicable If so, what is the name of the agency? N/A  Has the agency set up a time to come to the patient's home? not applicable Were any new equipment or medical supplies ordered?  No What is the name of the medical supply agency? N/A Were you able to get the supplies/equipment? not applicable Do you have any questions related to the use of the equipment or supplies? No  Functional Questionnaire: (I = Independent and D = Dependent) ADLs: I  Bathing/Dressing- I  Meal Prep- I  Eating- I  Maintaining continence- I  Transferring/Ambulation- I  Managing Meds- I  Follow up appointments reviewed:  PCP Hospital f/u appt confirmed? No   Specialist Hospital f/u appt confirmed? Yes  Scheduled to see Cardiology on 06/03/2021 @ 9:30 am. Are transportation arrangements needed? Yes  patient was advised to contact number on back of insurance card for transportation arrangements.  If their condition worsens, is the pt aware to call PCP or go to the Emergency Dept.? Yes Was the patient provided with contact information for the PCP's office or ED? Yes Was to pt encouraged to call back with questions or concerns? Yes

## 2021-06-02 ENCOUNTER — Telehealth: Payer: Self-pay

## 2021-06-02 NOTE — Telephone Encounter (Signed)
2:10pm - CSW received a call from the patient's spouse, Jeffrey [name] [phone] about her husband Jeffrey [name] needing transportation to his appointment at the [facility] tomorrow at 9:30am. Jeffrey [name] also asked about the Food Stamp and Disability application and that they have heard from the [facility] and mailed them back some paperwork they were requesting and CSW informed Jeffrey [name] that Disability does take awhile but it is good that they have been in touch with the [facility]. CSW encouraged Jeffrey [name] and her husband to follow up with DSS at [phone] regarding the Food Stamps and that Mr. Mucha can also speak with the outpatient HF CSW at his appointment tomorrow regarding all of this as well. CSW provided her with the HF outpatient CSW's telephone number as well and ended the call. CSW called [facility] [employer] and arranged transportation for [name] 06/03/21 at 9:30am and they will pick him up at his address at 9:05am tomorrow.  Carlis Burnsworth, MSW, [place] Heart Failure Social Worker

## 2021-06-02 NOTE — Progress Notes (Signed)
Advanced Heart Failure Clinic Note   Primary Care Provider:  [name], FNP (Inactive) Primary Cardiologist:  Dr. [name] Primary HF Cardiologist: Dr. [name]  History: Jeffrey [name] is a 45 y.o. male with a history of  poorly controlled HTN, R renal cell carcinoma s/p nephrectomy, CKD IIIa, DM2, OSA, gout, morbid obesity and systolic HF due to NICM.   Echo in 2012 with EF 20-25%.  In 1/13 had nuclear study EF 45-50% suggestive of NICM.  Admitted 3/16 secondary to atypical chest pain, dyspnea, and mild troponin elevation. Echo with "severely reduced LV function" - EF not quantified due to poor windows. Cath 8/16 showed normal coronaries and moderately elevated filling pressures with moderately depressed cardiac output. [place] s- ICD implanted.  Admitted 9/18 with ADHF in setting of new afib. Started on amiodarone. Pt diuresed and converted to NSR without requiring DCCV. Started on Eliquis for [facility].  Admitted 3/19 with ICD shock. Electrolytes stable. Echo 12/31/17 with LVEF 25-30%.   Pt admitted 05/15/20 for S-ICD generator change. Course was complicated with acute hypoxic/hypercapnic respiratory failure due to possible flash pulmonary edema. Had traumatic intubation resulting in aspiration and requiring ventilator support and short term pressor support.  Hospitalized 2/17-2/22/22 for hypotension  & dizziness. Found to have AKI, likely 2/2 to recent diarrhea, diuretic use and poor po intake. Gently hydrated with IVF, Entresto and torsemide decreased, and BiDil stopped. Discharge weight 323 lbs.  Admitted 99991111 with A/C systolic CHF/cardiogenic shock and AF with RVR. He was placed on amio gtt and underwent cardioversion. Had recurrent AF and underwent cardioversion again. Echo showed EF < 20%, severe LV dilation, moderate RV dysfunction. Required NE and dobutamine, which were eventually weaned off. Hospitalization c/b by acute respiratory failure with hypoxia requiring  mechanical ventilation. Seen by CCM and hypoxia felt to be due to atelectasis. Diuresed with lasix gtt with weight loss of 30 lb. Torsemide stopped after he developed hypotension and worsening renal function. GDMT was limited by hypotension and VAD workup initiated. Underwent RHC, for VAD work up, which showed well-compensated hemodynamics. Creatinine peaked at 4.65 improved to 1.58 which is his baseline. Also treated for acute gout flare with prednisone and colchicine. Sertraline initiated for management of depression. He was discharged to CIR, weight 299 lbs.  Today he returns for post hospitalization HF follow up. He is using a walker for balance. Overall feeling fine. No significant dyspnea walking on flat ground. Denies CP, dizziness, edema, or PND/Orthopnea. Appetite ok. No fever or chills. Weight at home 301 pounds. Taking all medications. He will start PT next week. Working on portion control. Intermittently seeing some light pink colored urine. He has not taken any torsemide. He wears his CPAP nightly.  Cardiac Studies: - Echo (8/22): EF <20%, severe LV dysfunction, grade III DD, RV moderately reduced.  - RHC (8/22): Well-compensated hemodynamics Findings:   RA = 6 RV = 29/3 PA = 35/12 (22) PCW = 16 Fick cardiac output/index = 5.5/2.3 PVR = 1.1 WU Ao sat = 98% PA sat = 65%, 67%  - Echo (9/21): EF 20-25%, severe LV dysfunction, Grade I DD, RV ok. - Echo (3/19): EF 25-30%, Mod MR, mild RV dilation, Mod TR, RVSP 67 mm Hg.  - Echo (4/17): EF 20-25%  Grade III DD - Echo (8/16). EF 20-25%, grade II diastolic dysfunction normal RV size with mild HK    - L/RHC (8/16): RA = 7 RV = 42/3/7 PA =  43/30 (39) PCW =  35 Fick cardiac output/index = 4.9/2.1 PVR = 2.7 WU SVR = 1651 FA sat = 95% PA sat = 65%, 65% Ao = 122/97 (109) LV = 110/31/33  1) Normal coronaries 2) Moderately elevated filling pressures with moderately depressed cardiac output  Review of systems complete and found to  be negative unless listed in HPI.    Past Medical History:  Diagnosis Date   Anxiety    Aspirin allergy    Childhood asthma    Chronic systolic CHF (congestive heart failure) ([place])    a. EF 20-25% in 2012. b. EF 45-50% in 10/2011 with nonischemic nuc - presumed NICM. c. 12/2014 Echo: Sev depressed LV fxn, sev dil LV, mild LVH, mild MR, sev dil LA, mildly reduced RV fxn.   CKD (chronic kidney disease) stage 2, GFR 60-89 ml/min    H/O vasectomy 12/2019   High cholesterol    Hypertension    Morbid obesity ([place])    Nephrolithiasis    OSA on CPAP    Paroxysmal atrial fibrillation ([place])    Presumed NICM    a. 04/2014 Myoview: EF 26%, glob HK, sev glob HK, ? prior infarct;  b. Never cathed 2/2 CKD.   Renal cell carcinoma ([place])    a. s/p Rt robotic assisted partial converted to radical nephrectomy on 01/2013.   Troponin level elevated    a. 04/2014, 12/2014: felt due to CHF.   Type II diabetes mellitus ([place])    Ventricular tachycardia ([place])    a. appropriate ICD therapy 12/2017   Past Surgical History:  Procedure Laterality Date   APPENDECTOMY  07/2004   BIOPSY  02/10/2021   Procedure: BIOPSY;  Surgeon: Gatha Mayer, MD;  Location: [facility] ENDOSCOPY;  Service: Endoscopy;;   CARDIAC CATHETERIZATION N/A 05/17/2015   Procedure: Right/Left Heart Cath and Coronary Angiography;  Surgeon: Jolaine Artist, MD;  Location: [place] CV LAB;  Service: Cardiovascular;  Laterality: N/A;   CARDIOVERSION N/A 04/30/2021   Procedure: CARDIOVERSION;  Surgeon: Larey Dresser, MD;  Location: [facility] ENDOSCOPY;  Service: Cardiovascular;  Laterality: N/A;   COLONOSCOPY WITH PROPOFOL N/A 02/10/2021   Procedure: COLONOSCOPY WITH PROPOFOL;  Surgeon: Gatha Mayer, MD;  Location: [facility] ENDOSCOPY;  Service: Endoscopy;  Laterality: N/A;   EP IMPLANTABLE DEVICE N/A 05/17/2015   Procedure: SubQ ICD Implant;  Surgeon: Will Meredith Leeds, MD;  Location: [place] CV LAB;  Service: Cardiovascular;  Laterality: N/A;    ESOPHAGOGASTRODUODENOSCOPY (EGD) WITH PROPOFOL N/A 02/10/2021   Procedure: ESOPHAGOGASTRODUODENOSCOPY (EGD) WITH PROPOFOL;  Surgeon: Gatha Mayer, MD;  Location: [facility];  Service: Endoscopy;  Laterality: N/A;   RIGHT HEART CATH N/A 05/13/2021   Procedure: RIGHT HEART CATH;  Surgeon: Jolaine Artist, MD;  Location: [place] CV LAB;  Service: Cardiovascular;  Laterality: N/A;   ROBOTIC ASSISTED LAPAROSCOPIC LYSIS OF ADHESION  01/13/2013   Procedure: ROBOTIC ASSISTED LAPAROSCOPIC LYSIS OF ADHESION EXTENSIVE;  Surgeon: Alexis Frock, MD;  Location: [facility] ORS;  Service: Urology;;   ROBOTIC ASSITED PARTIAL NEPHRECTOMY Right 01/13/2013   Procedure: ROBOTIC ASSITED PARTIAL NEPHRECTOMY CONVERTED TO ROBOTIC ASSISTED RIGHT RADICAL NEPHRECTOMY;  Surgeon: Alexis Frock, MD;  Location: [facility] ORS;  Service: Urology;  Laterality: Right;   SUBQ ICD CHANGEOUT N/A 05/16/2020   Procedure: SUBQ ICD CHANGEOUT;  Surgeon: [name], MD;  Location: [place] CV LAB;  Service: Cardiovascular;  Laterality: N/A;   SUBQ ICD CHANGEOUT N/A 05/15/2020   Procedure: SUBQ ICD CHANGEOUT;  Surgeon: [name], MD;  Location:  [place] INVASIVE CV LAB;  Service: Cardiovascular;  Laterality: N/A;   TEE WITHOUT CARDIOVERSION N/A 04/30/2021   Procedure: TRANSESOPHAGEAL ECHOCARDIOGRAM (TEE);  Surgeon: Larey Dresser, MD;  Location: [facility] ENDOSCOPY;  Service: Cardiovascular;  Laterality: N/A;   VASECTOMY     Allergies  Allergies  Allergen Reactions   Aspirin Shortness Of Breath, Itching and Rash     Burning sensation (Patient reports he tolerates other NSAIDS)    Bee Venom Hives and Swelling   Lisinopril Cough   Tomato Rash   Home Medications Current Outpatient Medications  Medication Sig Dispense Refill   acetaminophen (TYLENOL) 325 MG tablet Take 2 tablets (650 mg total) by mouth every 6 (six) hours as needed for fever.     allopurinol (ZYLOPRIM) 100 MG tablet Take 1 tablet (100 mg total) by mouth daily. 90 tablet 3    ALPRAZolam (XANAX) 0.5 MG tablet Take 1 tablet (0.5 mg total) by mouth 2 (two) times daily. 60 tablet 0   amiodarone (PACERONE) 200 MG tablet Take 1 tablet (200 mg total) by mouth daily. 30 tablet 0   apixaban (ELIQUIS) 5 MG TABS tablet Take 1 tablet (5 mg total) by mouth 2 (two) times daily. 60 tablet 0   diclofenac Sodium (VOLTAREN) 1 % GEL Apply 2 g topically 4 (four) times daily. 100 g 0   docusate sodium (COLACE) 100 MG capsule Take 1 capsule (100 mg total) by mouth 2 (two) times daily. 10 capsule 0   empagliflozin (JARDIANCE) 10 MG TABS tablet TAKE 1 TABLET (10 MG TOTAL) BY MOUTH DAILY BEFORE BREAKFAST. 90 tablet 3   losartan (COZAAR) 25 MG tablet Take 0.5 tablets (12.5 mg total) by mouth at bedtime. 30 tablet 0   nitroGLYCERIN (NITROSTAT) 0.4 MG SL tablet Place 1 tablet (0.4 mg total) under the tongue every 5 (five) minutes x 3 doses as needed for chest pain.  12   pantoprazole (PROTONIX) 40 MG tablet Take 1 tablet (40 mg total) by mouth daily. 30 tablet 0   polyethylene glycol (MIRALAX / GLYCOLAX) 17 g packet Take 17 g by mouth daily. 14 each 0   sertraline (ZOLOFT) 25 MG tablet Take 1 tablet (25 mg total) by mouth daily. 30 tablet 0   spironolactone (ALDACTONE) 25 MG tablet Take 1 tablet (25 mg total) by mouth daily. 30 tablet 6   torsemide (DEMADEX) 20 MG tablet Take 1 tablet (20 mg total) by mouth daily as needed (If wt gain > 3 lb in 24 hr or > 5 lb in 1 week). 30 tablet 2   traMADol (ULTRAM) 50 MG tablet Take 2 tablets (100 mg total) by mouth every 12 (twelve) hours as needed for moderate pain. 30 tablet 0   traZODone (DESYREL) 100 MG tablet Take 1 tablet (100 mg total) by mouth at bedtime. 30 tablet 0   No current facility-administered medications for this encounter.   BP (!) 122/97   Pulse 92   Wt (!) 138.5 kg (305 lb 6.4 oz)   SpO2 97%   BMI 46.44 kg/m    Wt Readings from Last 3 Encounters:  06/03/21 (!) 138.5 kg (305 lb 6.4 oz)  05/27/21 (!) 139.3 kg (306 lb 15.9 oz)   05/22/21 (!) 136.3 kg (300 lb 7.8 oz)   Physical Exam General:  NAD. No resp difficulty, walked into clinic with rolling walker HEENT: Normal Neck: Supple. No JVD. Carotids 2+ bilat; no bruits. No lymphadenopathy or thryomegaly appreciated. Cor: PMI nondisplaced. Regular rate & rhythm. No rubs,  gallops or murmurs. Lungs: Clear Abdomen: Obese, nontender, nondistended. No hepatosplenomegaly. No bruits or masses. Good bowel sounds. Extremities: No cyanosis, clubbing, rash, edema Neuro: Alert & oriented x 3, cranial nerves grossly intact. Moves all 4 extremities w/o difficulty. Affect pleasant.  ECG: SR 89 qrs 126 ms (personally reviewed).  Assessment & Plan 1. Chronic systolic CHF - Nonischemic CM. - Echo (9/21): EF 20-25%.   - Echo this admission (8/22): EF < 20%, severe LV dilation, moderate RV dysfunction.   - RHC (8/22): well compensated hemodynamics. - s/p S-ICD - NYHA III today, volume good today. - Continue torsemide prn. - Stop losartan. - Start Entresto 24/26 mg bid.  - Continue spiro 25 mg daily - Continue Jardiance 10 mg daily - No BB d/t recent shock. - No Bidil yet. - VAD work-up started.  - Will arrange for CPX after he recovers strength (currently using a walker and will start with PT soon). - BMET today; repeat in 10 days..   2 Atrial fibrillation: Paroxysmal - Emergent TEE-guided DCCV 7/27 due to inability to control HR. - Recurrent AF on 8/5. s/p DC-CV on 05/10/21.  - Seen by EP. Not a candidate for ablation due to body habitus  (could consider AVN ablation and CRT, if needed)  - SR on ECG today. - Decrease amiodarone to 100 mg daily. - Continue Eliquis 5 mg bid. Has intermittent light pink urine. - CBC today.   3. CKD stage 3  - Has solitary kidney.   - Baseline creatinine is 1.5-1.7.  - Avoid hypotension. - BMET today.   4. OSA - Continue CPAP.    5. DM2. Poorly controlled - On Jardiance. - Hgb A1c 9.4.   6. Physical Deconditioning - He will  start PT this week.   Follow up in 3 weeks with PharmD for further medication titration and in 6 weeks with APP. Will consider timing of CPX after [place], FNP-BC 06/03/21 10:01 AM

## 2021-06-03 ENCOUNTER — Other Ambulatory Visit (HOSPITAL_COMMUNITY): Payer: Self-pay

## 2021-06-03 ENCOUNTER — Ambulatory Visit (HOSPITAL_COMMUNITY)
Admission: RE | Admit: 2021-06-03 | Discharge: 2021-06-03 | Disposition: A | Discharge status: Home / Self Care | Payer: Medicaid Other | Source: Ambulatory Visit | Attending: Family Medicine | Admitting: Family Medicine

## 2021-06-03 ENCOUNTER — Encounter (HOSPITAL_COMMUNITY): Payer: Self-pay

## 2021-06-03 ENCOUNTER — Other Ambulatory Visit: Payer: Self-pay

## 2021-06-03 VITALS — BP 122/97 | HR 92 | Wt 305.4 lb

## 2021-06-03 DIAGNOSIS — G4733 Obstructive sleep apnea (adult) (pediatric): Secondary | ICD-10-CM | POA: Diagnosis not present

## 2021-06-03 DIAGNOSIS — Z905 Acquired absence of kidney: Secondary | ICD-10-CM | POA: Diagnosis not present

## 2021-06-03 DIAGNOSIS — Z85528 Personal history of other malignant neoplasm of kidney: Secondary | ICD-10-CM | POA: Diagnosis not present

## 2021-06-03 DIAGNOSIS — Z6841 Body Mass Index (BMI) 40.0 and over, adult: Secondary | ICD-10-CM | POA: Diagnosis not present

## 2021-06-03 DIAGNOSIS — F32A Depression, unspecified: Secondary | ICD-10-CM | POA: Diagnosis not present

## 2021-06-03 DIAGNOSIS — Z888 Allergy status to other drugs, medicaments and biological substances status: Secondary | ICD-10-CM | POA: Insufficient documentation

## 2021-06-03 DIAGNOSIS — R739 Hyperglycemia, unspecified: Secondary | ICD-10-CM

## 2021-06-03 DIAGNOSIS — Z7984 Long term (current) use of oral hypoglycemic drugs: Secondary | ICD-10-CM | POA: Insufficient documentation

## 2021-06-03 DIAGNOSIS — E1165 Type 2 diabetes mellitus with hyperglycemia: Secondary | ICD-10-CM | POA: Insufficient documentation

## 2021-06-03 DIAGNOSIS — N1831 Chronic kidney disease, stage 3a: Secondary | ICD-10-CM | POA: Insufficient documentation

## 2021-06-03 DIAGNOSIS — Z79899 Other long term (current) drug therapy: Secondary | ICD-10-CM | POA: Diagnosis not present

## 2021-06-03 DIAGNOSIS — Z886 Allergy status to analgesic agent status: Secondary | ICD-10-CM | POA: Insufficient documentation

## 2021-06-03 DIAGNOSIS — Z95810 Presence of automatic (implantable) cardiac defibrillator: Secondary | ICD-10-CM | POA: Diagnosis not present

## 2021-06-03 DIAGNOSIS — I480 Paroxysmal atrial fibrillation: Secondary | ICD-10-CM | POA: Diagnosis not present

## 2021-06-03 DIAGNOSIS — I428 Other cardiomyopathies: Secondary | ICD-10-CM | POA: Diagnosis not present

## 2021-06-03 DIAGNOSIS — I130 Hypertensive heart and chronic kidney disease with heart failure and stage 1 through stage 4 chronic kidney disease, or unspecified chronic kidney disease: Secondary | ICD-10-CM | POA: Insufficient documentation

## 2021-06-03 DIAGNOSIS — M109 Gout, unspecified: Secondary | ICD-10-CM | POA: Insufficient documentation

## 2021-06-03 DIAGNOSIS — Z7901 Long term (current) use of anticoagulants: Secondary | ICD-10-CM | POA: Insufficient documentation

## 2021-06-03 DIAGNOSIS — E119 Type 2 diabetes mellitus without complications: Secondary | ICD-10-CM

## 2021-06-03 DIAGNOSIS — I5022 Chronic systolic (congestive) heart failure: Secondary | ICD-10-CM | POA: Diagnosis not present

## 2021-06-03 DIAGNOSIS — E1122 Type 2 diabetes mellitus with diabetic chronic kidney disease: Secondary | ICD-10-CM | POA: Insufficient documentation

## 2021-06-03 DIAGNOSIS — R7303 Prediabetes: Secondary | ICD-10-CM

## 2021-06-03 LAB — CBC
HCT: 41.2 % (ref 39.0–52.0)
Hemoglobin: 12.7 g/dL — ABNORMAL LOW (ref 13.0–17.0)
MCH: 26.1 pg (ref 26.0–34.0)
MCHC: 30.8 g/dL (ref 30.0–36.0)
MCV: 84.8 fL (ref 80.0–100.0)
Platelets: 254 10*3/uL (ref 150–400)
RBC: 4.86 MIL/uL (ref 4.22–5.81)
RDW: 16.6 % — ABNORMAL HIGH (ref 11.5–15.5)
WBC: 5 10*3/uL (ref 4.0–10.5)
nRBC: 0 % (ref 0.0–0.2)

## 2021-06-03 LAB — BASIC METABOLIC PANEL WITH GFR
Anion gap: 7 (ref 5–15)
BUN: 21 mg/dL — ABNORMAL HIGH (ref 6–20)
CO2: 24 mmol/L (ref 22–32)
Calcium: 9.9 mg/dL (ref 8.9–10.3)
Chloride: 107 mmol/L (ref 98–111)
Creatinine, Ser: 1.56 mg/dL — ABNORMAL HIGH (ref 0.61–1.24)
GFR, Estimated: 55 mL/min — ABNORMAL LOW
Glucose, Bld: 111 mg/dL — ABNORMAL HIGH (ref 70–99)
Potassium: 4.3 mmol/L (ref 3.5–5.1)
Sodium: 138 mmol/L (ref 135–145)

## 2021-06-03 MED ORDER — METFORMIN HCL 1000 MG PO TABS
1000.0000 mg | ORAL_TABLET | Freq: Two times a day (BID) | ORAL | 11 refills | Status: DC
  Filled 2021-06-03: qty 60, 30d supply, fill #0
  Filled 2021-08-14: qty 60, 30d supply, fill #1
  Filled 2021-09-18: qty 60, 30d supply, fill #2
  Filled 2021-11-03: qty 60, 30d supply, fill #0
  Filled 2021-11-27: qty 60, 30d supply, fill #1
  Filled 2022-01-20: qty 60, 30d supply, fill #2
  Filled 2022-03-03: qty 60, 30d supply, fill #3
  Filled 2022-04-06: qty 60, 30d supply, fill #4
  Filled 2022-05-08: qty 60, 30d supply, fill #5

## 2021-06-03 MED ORDER — INSULIN ASPART PROT & ASPART (70-30 MIX) 100 UNIT/ML PEN
38.0000 [IU] | PEN_INJECTOR | Freq: Two times a day (BID) | SUBCUTANEOUS | 11 refills | Status: DC
  Filled 2021-06-03: qty 15, 20d supply, fill #0
  Filled 2021-08-11: qty 15, 20d supply, fill #1
  Filled 2021-09-09: qty 15, 20d supply, fill #2
  Filled 2021-10-14: qty 15, 20d supply, fill #3
  Filled 2021-10-14: qty 15, 20d supply, fill #0
  Filled 2021-12-15: qty 15, 20d supply, fill #1
  Filled 2022-02-16: qty 15, 20d supply, fill #2

## 2021-06-03 MED ORDER — ENTRESTO 24-26 MG PO TABS
1.0000 | ORAL_TABLET | Freq: Two times a day (BID) | ORAL | 3 refills | Status: DC
  Filled 2021-06-03: qty 60, 30d supply, fill #0

## 2021-06-03 MED ORDER — AMIODARONE HCL 200 MG PO TABS
100.0000 mg | ORAL_TABLET | Freq: Every day | ORAL | 0 refills | Status: DC
Start: 2021-06-03 — End: 2021-06-26

## 2021-06-03 NOTE — Progress Notes (Signed)
Medication Samples have been provided to the patient.  Drug name: Leticia Clas       Strength: 24/'26mg'$         Qty: 1  LOTGE:[id number]  Exp.Date: 7/24  Dosing instructions: take 1 tab Twice daily   The patient has been instructed regarding the correct time, dose, and frequency of taking this medication, including desired effects and most common side effects.   Romeka Scifres 10:32 AM 06/03/2021

## 2021-06-03 NOTE — Progress Notes (Signed)
10:47am - CSW received a call from Mr. Reeser wife, [name] [phone] asking about arranging transportation home for Mr. Fineran from his [facility] appointment this morning. CSW informed [name] that the [facility] will take care of it and make sure he gets home today. CSW sent a secure chat to RN, Rose Fillers and FNP, [facility] to provide Mr. Dealba with [employer]'s number to get him home from the appointment. RN, Rose Fillers reported he is calling [employer] now.   Daris Aristizabal, MSW, [place] [facility] Social Worker

## 2021-06-03 NOTE — Patient Instructions (Signed)
Stop Losartan  Start Entresto 24/26 mg Twice daily   Decrease Amiodarone to 100 mg (1/2 tab) Daily  Labs done today, your results will be available in MyChart, we will contact you for abnormal readings.  Your physician recommends that you return for lab work in: 1-2 weeks  Please follow up with our heart failure pharmacist in 3-4 weeks  Your physician recommends that you schedule a follow-up appointment in: 6 weeks  If you have any questions or concerns before your next appointment please send Jeffrey [name] a message through [place] or call our office at [phone].    TO LEAVE A MESSAGE FOR THE NURSE SELECT OPTION 2, PLEASE LEAVE A MESSAGE INCLUDING: YOUR NAME DATE OF BIRTH CALL BACK NUMBER REASON FOR CALL**this is important as we prioritize the call backs  YOU WILL RECEIVE A CALL BACK THE SAME DAY AS LONG AS YOU CALL BEFORE 4:00 PM  At the [facility], you and your health needs are our priority. As part of our continuing mission to provide you with exceptional heart care, we have created designated Provider Care Teams. These Care Teams include your primary Cardiologist (physician) and Advanced Practice Providers (APPs- Physician Assistants and Nurse Practitioners) who all work together to provide you with the care you need, when you need it.   You may see any of the following providers on your designated Care Team at your next follow up: Dr Glori Bickers Dr Loralie Champagne Dr Patrice Paradise, NP Lyda Jester, [place] Ginnie Smart Audry Riles, PharmD   Please be sure to bring in all your medications bottles to every appointment.

## 2021-06-03 NOTE — Patient Outreach (Signed)
Medicaid Managed Care    Pharmacy Note  06/03/2021 Name: Jeffrey [name] MRN: [id number] DOB: 1976-06-14  Jeffrey [name] is a 45 y.o. year old male who is a primary care patient of [name], FNP (Inactive). The [facility] Managed Care Coordination team was consulted for assistance with disease management and care coordination needs.    Engaged with patient Engaged with patient by telephone for follow up visit in response to referral for case management and/or care coordination services.  Jeffrey [name] was given information about Managed Medicaid Care Coordination team services today. Jeffrey [name] agreed to services and verbal consent obtained.   Objective:  Lab Results  Component Value Date   CREATININE 1.56 (H) 06/03/2021   CREATININE 1.62 (H) 05/28/2021   CREATININE 1.66 (H) 05/27/2021    Lab Results  Component Value Date   HGBA1C 9.4 (H) 04/30/2021       Component Value Date/Time   CHOL 149 05/12/2021 1303   CHOL 188 10/28/2020 0932   TRIG 120 05/12/2021 1303   HDL 31 (L) 05/12/2021 1303   HDL 37 (L) 10/28/2020 0932   CHOLHDL 4.8 05/12/2021 1303   VLDL 24 05/12/2021 1303   LDLCALC 94 05/12/2021 1303   LDLCALC 107 (H) 10/28/2020 0932    Other: (TSH, CBC, Vit D, etc.)  Clinical ASCVD: Yes  The ASCVD Risk score Mikey Bussing DC Jr., et al., 2013) failed to calculate for the following reasons:   The patient has a prior MI or stroke diagnosis    Other: (CHADS2VASc if Afib, PHQ9 if depression, MMRC or CAT for COPD, ACT, DEXA)  BP Readings from Last 3 Encounters:  06/03/21 (!) 122/97  05/28/21 108/82  05/22/21 94/74    Assessment/Interventions: Review of patient past medical history, allergies, medications, health status, including review of consultants reports, laboratory and other test data, was performed as part of comprehensive evaluation and provision of chronic care management services.    Afib Amiodarone '100mg'$  (Decreased from '200mg'$  06/03/21) Apixaban '5mg'$  BID Entresto  24/26 Spironolactone '25mg'$  QD Torsemide '20mg'$  QD August 2022: Had visit with Cardio this morning, will defer to them  Mental Health  Depression screen [facility] 2/9 06/24/2020 12/18/2019 06/19/2019  Decreased Interest 0 0 0  Down, Depressed, Hopeless 0 0 0  PHQ - 2 Score 0 0 0  Some recent data might be hidden   Sertraline '25mg'$  QD August 2022: Patient was clearly tired of provider visits (Saw Cardio this morning). Didn't do a PHQ assessment, mainly spent time during the visit re-introducing myself and being there for any questions/concerns  DM Lab Results  Component Value Date   HGBA1C 9.4 (H) 04/30/2021   HGBA1C 8.6 (H) 10/28/2020   HGBA1C 7.5 (A) 06/24/2020   HGBA1C 7.5 06/24/2020   HGBA1C 7.5 (A) 06/24/2020   HGBA1C 7.5 (A) 06/24/2020   Lab Results  Component Value Date   MICROALBUR '150mg'$  02/17/2017   LDLCALC 94 05/12/2021   CREATININE 1.56 (H) 06/03/2021    Lab Results  Component Value Date   NA 138 06/03/2021   K 4.3 06/03/2021   CREATININE 1.56 (H) 06/03/2021   GFRNONAA 55 (L) 06/03/2021   GFRAA 66 09/04/2020   GLUCOSE 111 (H) 06/03/2021    Lab Results  Component Value Date   WBC 5.0 06/03/2021   HGB 12.7 (L) 06/03/2021   HCT 41.2 06/03/2021   MCV 84.8 06/03/2021   PLT 254 06/03/2021   Empagliflozin '10mg'$  August 2022: Upon entering the hospital, patient was on  Metformin  $'1000mg'k$  BID Aspart SS NPH 70-30 38 units BID Jardiance  On Discharge, only on Jardiance. Kathe Becton, PCP is no longer working at the clinic so was unable to ask her what to do. Spoke with Constance Holster at clinic [phone]), she stated patient has to get a new PCP with [facility]ress[facility]ard or [employer] and they can decide. Called patient to inform him, then patient will call me back once he gets a PCP and I'll msg the PCP asking what to do. While waiting for patient to call me back, Amy Clogg, who Dc'd Metformin in hospital, msg'd me back. She said she wasn't in the clinic and forwarded the direct  message to [facility]. [place] said she didn't mean to DC the meds and that he should go back on them. Will let patient know when he calls me back. F/U 1 month to see how sugars are looking  Gout Tried/Failed: Allopurinol '100mg'$   (Dc'd August 2022) -Last attack was "couple months ago" August 2022: No longer on therapy, will re-evaluate in December to see if they re-start after cardio issues settle     SDOH (Social Determinants of Health) assessments and interventions performed:    Care Plan  Allergies  Allergen Reactions   Aspirin Shortness Of Breath, Itching and Rash     Burning sensation (Patient reports he tolerates other NSAIDS)    Bee Venom Hives and Swelling   Lisinopril Cough   Tomato Rash    Medications Reviewed Today     Reviewed by Rockwell Alexandria, CMA (Certified Medical Assistant) on 06/03/21 at [phone]  Med List Status: <None>   Medication Order Taking? Sig Documenting Provider Last Dose Status Informant  acetaminophen (TYLENOL) 325 MG tablet [id number] Yes Take 2 tablets (650 mg total) by mouth every 6 (six) hours as needed for fever. Darrick Grinder D, NP Taking Active   allopurinol (ZYLOPRIM) 100 MG tablet [id number] Yes Take 1 tablet (100 mg total) by mouth daily. Cathlyn Parsons, PA-C Taking Active   ALPRAZolam Duanne Moron) 0.5 MG tablet [id number] Yes Take 1 tablet (0.5 mg total) by mouth 2 (two) times daily. Cathlyn Parsons, PA-C Taking Active   amiodarone (PACERONE) 200 MG tablet [id number] Yes Take 1 tablet (200 mg total) by mouth daily. Angiulli, Lavon Paganini, PA-C Taking Active   apixaban (ELIQUIS) 5 MG TABS tablet [id number] Yes Take 1 tablet (5 mg total) by mouth 2 (two) times daily. Cathlyn Parsons, PA-C Taking Active   diclofenac Sodium (VOLTAREN) 1 % GEL [id number] Yes Apply 2 g topically 4 (four) times daily. Izora Ribas, MD Taking Active   docusate sodium (COLACE) 100 MG capsule [id number] Yes Take 1 capsule (100 mg total) by mouth 2 (two) times daily.  Clegg, Amy D, NP Taking Active   empagliflozin (JARDIANCE) 10 MG TABS tablet [id number] Yes TAKE 1 TABLET (10 MG TOTAL) BY MOUTH DAILY BEFORE BREAKFAST. Angiulli, Lavon Paganini, PA-C Taking Active   losartan (COZAAR) 25 MG tablet [id number] Yes Take 0.5 tablets (12.5 mg total) by mouth at bedtime. Cathlyn Parsons, PA-C Taking Active   nitroGLYCERIN (NITROSTAT) 0.4 MG SL tablet [id number] Yes Place 1 tablet (0.4 mg total) under the tongue every 5 (five) minutes x 3 doses as needed for chest pain. Clegg, Amy D, NP Taking Active   pantoprazole (PROTONIX) 40 MG tablet [id number] Yes Take 1 tablet (40 mg total) by mouth daily. Cathlyn Parsons, PA-C Taking Active   polyethylene glycol (MIRALAX / GLYCOLAX) 17 g packet [id number] Yes  Take 17 g by mouth daily. Cathlyn Parsons, PA-C Taking Active   sertraline (ZOLOFT) 25 MG tablet [id number] Yes Take 1 tablet (25 mg total) by mouth daily. Cathlyn Parsons, PA-C Taking Active   spironolactone (ALDACTONE) 25 MG tablet [id number] Yes Take 1 tablet (25 mg total) by mouth daily. Cathlyn Parsons, PA-C Taking Active   torsemide (DEMADEX) 20 MG tablet [id number] Yes Take 1 tablet (20 mg total) by mouth daily as needed (If wt gain > 3 lb in 24 hr or > 5 lb in 1 week). Izora Ribas, MD Taking Active   traMADol Veatrice Bourbon) 50 MG tablet [id number] Yes Take 2 tablets (100 mg total) by mouth every 12 (twelve) hours as needed for moderate pain. Cathlyn Parsons, PA-C Taking Active   traZODone (DESYREL) 100 MG tablet [id number] Yes Take 1 tablet (100 mg total) by mouth at bedtime. Cathlyn Parsons, PA-C Taking Active             Patient Active Problem List   Diagnosis Date Noted   Cardiogenic shock [facility])    Atrial fibrillation with RVR ([place]) 04/30/2021   Acute hypoxemic respiratory failure (HCC)    Right wrist pain 04/16/2021   Absolute anemia    Rectal bleeding    Iron deficiency 12/25/2020   Long term current use of anticoagulant - apixaban 12/25/2020    Paroxysmal atrial fibrillation (HCC)    Lactic acidosis 11/22/2020   Hypotension 11/22/2020   Aspiration into airway    ICD (implantable cardioverter-defibrillator) battery depletion 05/15/2020   Endotracheal tube present    Respiratory failure ([place])    Acute encephalopathy    Acute pulmonary edema (HCC)    NICM (nonischemic cardiomyopathy) ([place]) 04/24/2020   S/P vasectomy 12/20/2019   Anxiety 12/20/2019   Hemoglobin A1C between 7% and 9% indicating borderline diabetic control 12/20/2019   Seasonal allergies 12/20/2019   Insomnia 12/20/2019   Vasectomy evaluation 06/20/2019   Visual problems 06/20/2019   Blurry vision, bilateral 06/20/2019   Hyperglycemia 02/13/2019   Hyperosmolar (nonketotic) coma ([place]) 01/19/2019   AICD (automatic cardioverter/defibrillator) present 01/19/2019   Hyperlipidemia 12/07/2018   Follow-up exam 11/22/2018   Class 3 severe obesity due to excess calories with serious comorbidity and body mass index (BMI) of 45.0 to 49.9 in adult ([place]) 11/22/2018   Dizziness 11/22/2018   Lingular pneumonia 08/04/2018   Ventricular tachycardia ([place]) 12/30/2017   PAF (paroxysmal atrial fibrillation) ([place])    Dilated cardiomyopathy ([place])    Pollen allergy 02/17/2017   Dyslipidemia 02/17/2017   Hematochezia 11/08/2015   CKD (chronic kidney disease), stage III ([place]) 06/20/2015   Cardiac defibrillator in situ    Chronic systolic CHF (congestive heart failure) ([place]) 03/11/2015   Gouty arthritis 03/11/2015   Chest pain 12/28/2014   Acute renal failure superimposed on stage 3 chronic kidney disease ([place]) 04/08/2014   Aspirin allergy 04/08/2014   Renal cell carcinoma ([place])    Gout attack 11/10/2011   Morbid obesity ([place]) 10/23/2011   OSA (obstructive sleep apnea) 10/23/2011   Type 2 diabetes mellitus with stage 3 chronic kidney disease, with long-term current use of insulin ([place]) 10/22/2011   Hyperlipidemia associated with type 2 diabetes mellitus ([place]) 12/25/2010    Hypertension associated with diabetes ([place]) 12/25/2010    Conditions to be addressed/monitored: Atrial Fibrillation, HTN, Hypertriglyceridemia and DM  Care Plan : Medication Management  Updates made by Lane Hacker, [place] since 01/13/2021 12:00 AM     Problem: Health Promotion or Disease Self-Management (  General Plan of Care)      Goal: Medication Management   Note:   Current Barriers:  Does not maintain contact with provider office   Pharmacist Clinical Goal(s):  Over the next 120 days, patient will contact provider office for questions/concerns as evidenced notation of same in electronic health record through collaboration with PharmD and provider.    Interventions: Inter-disciplinary care team collaboration (see longitudinal plan of care) Comprehensive medication review performed; medication list updated in electronic medical record  '@RXCPDIABETES'$ @ '@RXCPHYPERTENSION'$ @ '@RXCPHYPERLIPIDEMIA'$ @  Patient Goals/Self-Care Activities Over the next 120 days, patient will:  - collaborate with provider on medication access solutions  Follow Up Plan: The care management team will reach out to the patient again over the next 120 days.      Task: Mutually Develop and Royce Macadamia Achievement of Patient Goals   Note:   Care Management Activities:    - verbalization of feelings encouraged    Notes:      Medication Assistance: None required. Patient affirms current coverage meets needs.   Follow up: Agree  Plan: The care management team will reach out to the patient again over the next 120 days.   [place] Constable, Pharm.D., Managed Medicaid Pharmacist - [phone]

## 2021-06-03 NOTE — Progress Notes (Signed)
CSW met with patient in the clinic for follow up. Patient reports he is trying hard at home walking everyday and feels like he is making some improvements. Patient states he has been drug and alcohol free since discharge and states "they told me that I have to be drug and alcohol free for VAD". Patient adamant about pursuing VAD and states he is very motivated now. CSW provided supportive intervention and will continue to follow for possible VAD implant. Raquel Sarna, [place], [place]

## 2021-06-03 NOTE — Patient Instructions (Signed)
Visit Information  [name] was given information about Medicaid Managed Care team care coordination services as a part of their Healthy [facility] Medicaid benefit. Jeffrey [name] verbally consented to engagement with the [facility] Managed Care team.   If you are experiencing a medical emergency, please call 911 or report to your local emergency department or urgent care.   If you have a non-emergency medical problem during routine business hours, please contact your provider's office and ask to speak with a nurse.   For questions related to your Healthy [facility] health plan, please call: [phone] or visit the homepage here: [email or web address]  If you would like to schedule transportation through your Healthy [facility] plan, please call the following number at least 2 days in advance of your appointment: [phone]  Call the [place] at [phone], at any time, 24 hours a day, 7 days a week. If you are in danger or need immediate medical attention call 911.  If you would like help to quit smoking, call 1-800-QUIT-NOW 812-012-3303) OR Espaol: 1-855-Djelo-Ya HD:1601594) o para ms informacin haga clic aqu or Text READY to 200-400 to register via text  Jeffrey [name] - following are the goals we discussed in your visit today:   Goals Addressed   None     Please see education materials related to DM provided as print materials.   The patient verbalized understanding of instructions provided today and declined a print copy of patient instruction materials.   The  Patient                                              has been provided with contact information for the Managed Medicaid care management team and has been advised to call with any health related questions or concerns.   [place] Constable, Pharm.D., Managed Medicaid Pharmacist [phone]   Following is a copy of your plan of care:  Patient Care Plan: General  Plan of Care (Adult)     Problem Identified: Health Promotion or Disease Self-Management (General Plan of Care)   Priority: High  Onset Date: 09/24/2020  Note:   Current Barriers:  Chronic Disease Management support and education needs, CHF, HTN, CKD, OSA, gout, DM2, h/o renal cell carcinoma. Today, 04/28/21, having right hand and wist pain-neuro referral placed by provider.  Nurse Case Manager Clinical Goal(s):  Over the next 90 days, patient will attend all scheduled medical appointments:   Interventions:  Inter-disciplinary care team collaboration (see longitudinal plan of care) Evaluation of current treatment plan and patient's adherence to plan as established by provider.  Update 11/28/20:  Continue monitoring BP daily and reporting readings outside of parameters. Update 12/27/20:  Blood pressure 128/80. Update 04/28/21:  Patient not currently checking blood pressure. Reviewed medications with patient. Discussed plans with patient for ongoing care management follow up and provided patient with direct contact information for care management team Provided patient with educational materials related to exercise. Reviewed scheduled/upcoming provider appointments. Update 11/28/20:  Discussed low glycemic vs high glycemic foods.  Pharmacy referral made for review of medications. Pharmacy referral for medication review. Update 01/22/21:  Pharmacy review completed 01/13/21, follow up 05/15/21.  Patient Goals/Self-Care Activities Over the next 90 days, patient will:  -Attends all scheduled provider appointments Calls provider office for new concerns or questions  Follow Up Plan: The Managed Medicaid care management team  will reach out to the patient again over the next 30 days.  The patient has been provided with contact information for the Managed Medicaid care management team and has been advised to call with any health related questions or concerns.        Patient Care Plan: Medication  Management     Problem Identified: Health Promotion or Disease Self-Management (General Plan of Care)      Goal: Medication Management   Note:   Current Barriers:  Does not maintain contact with provider office   Pharmacist Clinical Goal(s):  Over the next 120 days, patient will contact provider office for questions/concerns as evidenced notation of same in electronic health record through collaboration with PharmD and provider.    Interventions: Inter-disciplinary care team collaboration (see longitudinal plan of care) Comprehensive medication review performed; medication list updated in electronic medical record  '@RXCPDIABETES'$ @ '@RXCPHYPERTENSION'$ @ '@RXCPHYPERLIPIDEMIA'$ @  Patient Goals/Self-Care Activities Over the next 120 days, patient will:  - collaborate with provider on medication access solutions  Follow Up Plan: The care management team will reach out to the patient again over the next 120 days.      Task: Mutually Develop and Royce Macadamia Achievement of Patient Goals   Note:   Care Management Activities:    - verbalization of feelings encouraged    Notes:

## 2021-06-04 ENCOUNTER — Other Ambulatory Visit: Payer: Self-pay

## 2021-06-06 ENCOUNTER — Other Ambulatory Visit (HOSPITAL_COMMUNITY): Payer: Self-pay | Admitting: Family Medicine

## 2021-06-06 ENCOUNTER — Other Ambulatory Visit: Payer: Self-pay | Admitting: Nurse Practitioner

## 2021-06-06 ENCOUNTER — Other Ambulatory Visit: Payer: Self-pay

## 2021-06-10 ENCOUNTER — Encounter: Payer: Self-pay | Admitting: Nurse Practitioner

## 2021-06-10 ENCOUNTER — Encounter: Payer: Self-pay | Admitting: Surgery

## 2021-06-10 ENCOUNTER — Institutional Professional Consult (permissible substitution): Payer: Medicaid Other | Admitting: Surgery

## 2021-06-10 ENCOUNTER — Ambulatory Visit: Payer: Medicaid Other | Admitting: Nurse Practitioner

## 2021-06-10 ENCOUNTER — Other Ambulatory Visit: Payer: Self-pay

## 2021-06-10 ENCOUNTER — Ambulatory Visit (INDEPENDENT_AMBULATORY_CARE_PROVIDER_SITE_OTHER): Payer: Medicaid Other | Admitting: Nurse Practitioner

## 2021-06-10 ENCOUNTER — Telehealth (HOSPITAL_COMMUNITY): Payer: Self-pay | Admitting: Pharmacist

## 2021-06-10 VITALS — BP 100/68 | HR 99 | Resp 20 | Ht 67.0 in | Wt 296.0 lb

## 2021-06-10 VITALS — BP 79/60 | HR 82 | Temp 97.9°F | Ht 67.0 in | Wt 299.8 lb

## 2021-06-10 DIAGNOSIS — I509 Heart failure, unspecified: Secondary | ICD-10-CM

## 2021-06-10 DIAGNOSIS — I9589 Other hypotension: Secondary | ICD-10-CM

## 2021-06-10 DIAGNOSIS — E1122 Type 2 diabetes mellitus with diabetic chronic kidney disease: Secondary | ICD-10-CM | POA: Diagnosis not present

## 2021-06-10 DIAGNOSIS — N183 Chronic kidney disease, stage 3 unspecified: Secondary | ICD-10-CM | POA: Diagnosis not present

## 2021-06-10 DIAGNOSIS — I5022 Chronic systolic (congestive) heart failure: Secondary | ICD-10-CM

## 2021-06-10 DIAGNOSIS — R319 Hematuria, unspecified: Secondary | ICD-10-CM | POA: Diagnosis not present

## 2021-06-10 DIAGNOSIS — Z794 Long term (current) use of insulin: Secondary | ICD-10-CM

## 2021-06-10 LAB — POCT URINALYSIS DIP (CLINITEK)
Glucose, UA: >=1000 mg/dL — AB
Ketones, POC UA: NEGATIVE mg/dL
Leukocytes, UA: NEGATIVE
Nitrite, UA: NEGATIVE
POC PROTEIN,UA: 30 — AB
Spec Grav, UA: 1.01 (ref 1.010–1.025)
Urobilinogen, UA: 0.2 E.U./dL
pH, UA: 6 (ref 5.0–8.0)

## 2021-06-10 NOTE — Progress Notes (Signed)
[place], [place]  [id number] Phone:  [phone]   Fax:  [phone] Subjective:   Patient ID: Jeffrey [name], male    DOB: 09-25-1976, 45 y.o.   MRN: [id number]  Chief Complaint  Patient presents with   Follow-up    Pt states he is here for a follow up visit. Pt states that he was in the hospital for Afib and congestive heart failure. Pt states that he feels a little better since his hospital visit and is taking it on day at a time with trying to get his mobility back. Pt also states that his blood pressure has been running low even when he was in the hospital.   HPI Jeffrey [name] 45 y.o. male presents with extensive medical history, including CHF, cardiogenic shock, hypertension and hypotension to the [facility] for follow up s/p hospital admission. Patient states that he was in the hospital for Afib and congestive heart failure and discharged after 2 mths. States that when he was initially admitted he had chest pain and shortness of breath. Symptoms have improved since discharge. Patient denies any chest pain, shortness of breath, HA or dizziness. Denies any numbness or tingling. Indicates that he was recently evaluated by cardiologist and has been compliant with treatment plan. Currently monitors B/P daily, highest 102/83. Currently compliant with diabetes medications, states that his blood glucose this morning was 102. Endorses losing more than 20 lbs over the past 2 mths.   Only concern today is visible hematuria, which he noted 2 wks prior to discharge from hospital, denies any other urinary symptoms. Currently uses walker to assist with ambulation. Will be starting PT within the next week. States that he sustained weakness in BLE due to prolonged admission/ bedrest.   Past Medical History:  Diagnosis Date   Anxiety    Aspirin allergy    Childhood asthma    Chronic systolic CHF (congestive heart failure) ([place])    a. EF 20-25% in 2012. b. EF  45-50% in 10/2011 with nonischemic nuc - presumed NICM. c. 12/2014 Echo: Sev depressed LV fxn, sev dil LV, mild LVH, mild MR, sev dil LA, mildly reduced RV fxn.   CKD (chronic kidney disease) stage 2, GFR 60-89 ml/min    H/O vasectomy 12/2019   High cholesterol    Hypertension    Morbid obesity ([place])    Nephrolithiasis    OSA on CPAP    Paroxysmal atrial fibrillation ([place])    Presumed NICM    a. 04/2014 Myoview: EF 26%, glob HK, sev glob HK, ? prior infarct;  b. Never cathed 2/2 CKD.   Renal cell carcinoma ([place])    a. s/p Rt robotic assisted partial converted to radical nephrectomy on 01/2013.   Troponin level elevated    a. 04/2014, 12/2014: felt due to CHF.   Type II diabetes mellitus ([place])    Ventricular tachycardia ([place])    a. appropriate ICD therapy 12/2017    Past Surgical History:  Procedure Laterality Date   APPENDECTOMY  07/2004   BIOPSY  02/10/2021   Procedure: BIOPSY;  Surgeon: Gatha Mayer, MD;  Location: [facility] ENDOSCOPY;  Service: Endoscopy;;   CARDIAC CATHETERIZATION N/A 05/17/2015   Procedure: Right/Left Heart Cath and Coronary Angiography;  Surgeon: Jolaine Artist, MD;  Location: [place] [facility];  Service: Cardiovascular;  Laterality: N/A;   CARDIOVERSION N/A 04/30/2021   Procedure: CARDIOVERSION;  Surgeon: Larey Dresser, MD;  Location: [facility] ENDOSCOPY;  Service: Cardiovascular;  Laterality: N/A;   COLONOSCOPY WITH PROPOFOL N/A 02/10/2021   Procedure: COLONOSCOPY WITH PROPOFOL;  Surgeon: Gatha Mayer, MD;  Location: [facility] ENDOSCOPY;  Service: Endoscopy;  Laterality: N/A;   EP IMPLANTABLE DEVICE N/A 05/17/2015   Procedure: SubQ ICD Implant;  Surgeon: Will Meredith Leeds, MD;  Location: [place] [facility];  Service: Cardiovascular;  Laterality: N/A;   ESOPHAGOGASTRODUODENOSCOPY (EGD) WITH PROPOFOL N/A 02/10/2021   Procedure: ESOPHAGOGASTRODUODENOSCOPY (EGD) WITH PROPOFOL;  Surgeon: Gatha Mayer, MD;  Location: [facility];  Service: Endoscopy;  Laterality: N/A;   RIGHT  HEART CATH N/A 05/13/2021   Procedure: RIGHT HEART CATH;  Surgeon: Jolaine Artist, MD;  Location: [place] [facility];  Service: Cardiovascular;  Laterality: N/A;   ROBOTIC ASSISTED LAPAROSCOPIC LYSIS OF ADHESION  01/13/2013   Procedure: ROBOTIC ASSISTED LAPAROSCOPIC LYSIS OF ADHESION EXTENSIVE;  Surgeon: Alexis Frock, MD;  Location: [facility] ORS;  Service: Urology;;   ROBOTIC ASSITED PARTIAL NEPHRECTOMY Right 01/13/2013   Procedure: ROBOTIC ASSITED PARTIAL NEPHRECTOMY CONVERTED TO ROBOTIC ASSISTED RIGHT RADICAL NEPHRECTOMY;  Surgeon: Alexis Frock, MD;  Location: [facility];  Service: Urology;  Laterality: Right;   SUBQ ICD CHANGEOUT N/A 05/16/2020   Procedure: SUBQ ICD CHANGEOUT;  Surgeon: [name], MD;  Location: [place] [facility];  Service: Cardiovascular;  Laterality: N/A;   SUBQ ICD CHANGEOUT N/A 05/15/2020   Procedure: SUBQ ICD CHANGEOUT;  Surgeon: [name], MD;  Location: [place] [facility];  Service: Cardiovascular;  Laterality: N/A;   TEE WITHOUT CARDIOVERSION N/A 04/30/2021   Procedure: TRANSESOPHAGEAL ECHOCARDIOGRAM (TEE);  Surgeon: Larey Dresser, MD;  Location[facility];  Service: Cardiovascular;  Laterality: N/A;   VASECTOMY      Family History  Problem Relation Age of Onset   Hypertension Mother    Diabetes Maternal Aunt    Heart attack Neg Hx    Stroke Neg Hx    Colon cancer Neg Hx    Pancreatic cancer Neg Hx    Stomach cancer Neg Hx    Liver cancer Neg Hx    Esophageal cancer Neg Hx     Social History   Socioeconomic History   Marital status: Married    Spouse name: Not on file   Number of children: Not on file   Years of education: Not on file   Highest education level: Not on file  Occupational History   Not on file  Tobacco Use   Smoking status: Former    Packs/day: 0.25    Years: 22.00    Pack years: 5.50    Types: Cigarettes    Quit date: 04/30/2015    Years since quitting: 6.1   Smokeless tobacco: Never  Vaping Use   Vaping Use:  Never used  Substance and Sexual Activity   Alcohol use: Not Currently    Comment: none   Drug use: Not Currently    Frequency: 14.0 times per week    Types: Marijuana    Comment: no longer smoking   Sexual activity: Not Currently    Birth control/protection: Condom  Other Topics Concern   Not on file  Social History Narrative   Married - 66 kids 64 boys 7 girls many are adults, 2 young children with current wife   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   Maybe 1 alcoholic beverage a day no caffeine currently still smokes cigarettes denies other tobacco and smokes marijuana  Social Determinants of Health   Financial Resource Strain: High Risk   Difficulty of Paying Living Expenses: Hard  Food Insecurity: Food Insecurity Present   Worried About Charity fundraiser in the Last Year: Sometimes true   Arboriculturist in the Last Year: Sometimes true  Transportation Needs: Public librarian (Medical): Yes   Lack of Transportation (Non-Medical): Yes  Physical Activity: Not on file  Stress: Not on file  Social Connections: Not on file  Intimate Partner Violence: Not on file    Outpatient Medications Prior to Visit  Medication Sig Dispense Refill   acetaminophen (TYLENOL) 325 MG tablet Take 2 tablets (650 mg total) by mouth every 6 (six) hours as needed for fever.     allopurinol (ZYLOPRIM) 100 MG tablet Take 1 tablet (100 mg total) by mouth daily. 90 tablet 3   ALPRAZolam (XANAX) 0.5 MG tablet Take 1 tablet (0.5 mg total) by mouth 2 (two) times daily. 60 tablet 0   amiodarone (PACERONE) 200 MG tablet Take 0.5 tablets (100 mg total) by mouth daily. 30 tablet 0   apixaban (ELIQUIS) 5 MG TABS tablet Take 1 tablet (5 mg total) by mouth 2 (two) times daily. 60 tablet 0   diclofenac Sodium (VOLTAREN) 1 % GEL Apply 2 g topically 4 (four) times daily. 100 g 0   empagliflozin (JARDIANCE) 10 MG TABS  tablet TAKE 1 TABLET (10 MG TOTAL) BY MOUTH DAILY BEFORE BREAKFAST. 90 tablet 3   insulin aspart protamine - aspart (NOVOLOG 70/30 MIX) (70-30) 100 UNIT/ML FlexPen Inject 38 Units into the skin 2 (two) times daily. 15 mL 11   metFORMIN (GLUCOPHAGE) 1000 MG tablet Take 1 tablet (1,000 mg total) by mouth 2 (two) times daily with a meal. 60 tablet 11   pantoprazole (PROTONIX) 40 MG tablet Take 1 tablet (40 mg total) by mouth daily. 30 tablet 0   sertraline (ZOLOFT) 25 MG tablet Take 1 tablet (25 mg total) by mouth daily. 30 tablet 0   spironolactone (ALDACTONE) 25 MG tablet Take 1 tablet (25 mg total) by mouth daily. 30 tablet 6   torsemide (DEMADEX) 20 MG tablet Take 1 tablet (20 mg total) by mouth daily as needed (If wt gain > 3 lb in 24 hr or > 5 lb in 1 week). 30 tablet 2   traMADol (ULTRAM) 50 MG tablet Take 2 tablets (100 mg total) by mouth every 12 (twelve) hours as needed for moderate pain. 30 tablet 0   traZODone (DESYREL) 100 MG tablet Take 1 tablet (100 mg total) by mouth at bedtime. 30 tablet 0   sacubitril-valsartan (ENTRESTO) 24-26 MG Take 1 tablet by mouth 2 (two) times daily. 60 tablet 3   docusate sodium (COLACE) 100 MG capsule Take 1 capsule (100 mg total) by mouth 2 (two) times daily. (Patient not taking: Reported on 06/10/2021) 10 capsule 0   nitroGLYCERIN (NITROSTAT) 0.4 MG SL tablet Place 1 tablet (0.4 mg total) under the tongue every 5 (five) minutes x 3 doses as needed for chest pain. (Patient not taking: Reported on 06/10/2021)  12   polyethylene glycol (MIRALAX / GLYCOLAX) 17 g packet Take 17 g by mouth daily. (Patient not taking: Reported on 06/10/2021) 14 each 0   No facility-administered medications prior to visit.    Allergies  Allergen Reactions   Aspirin Shortness Of Breath, Itching and Rash     Burning sensation (Patient reports he tolerates other NSAIDS)    Bee  Venom Hives and Swelling   Lisinopril Cough   Tomato Rash    Review of Systems  Constitutional:  Negative  for chills, fever and malaise/fatigue.  Respiratory:  Negative for cough and shortness of breath.   Cardiovascular:  Negative for chest pain, palpitations and leg swelling.  Gastrointestinal:  Negative for abdominal pain, blood in stool, constipation, diarrhea, nausea and vomiting.  Genitourinary:  Positive for hematuria.  Skin: Negative.   Neurological: Negative.   Psychiatric/Behavioral:  Negative for depression. The patient is not nervous/anxious.   All other systems reviewed and are negative.     Objective:    Physical Exam Vitals reviewed.  Constitutional:      General: He is not in acute distress.    Appearance: Normal appearance.  HENT:     Head: Normocephalic.  Eyes:     Extraocular Movements: Extraocular movements intact.     Conjunctiva/sclera: Conjunctivae normal.     Pupils: Pupils are equal, round, and reactive to light.  Cardiovascular:     Rate and Rhythm: Normal rate and regular rhythm.     Pulses: Normal pulses.     Heart sounds: Normal heart sounds.     Comments: No obvious peripheral edema Pulmonary:     Effort: Pulmonary effort is normal.     Breath sounds: Normal breath sounds.  Skin:    General: Skin is warm and dry.     Capillary Refill: Capillary refill takes less than 2 seconds.  Neurological:     General: No focal deficit present.     Mental Status: He is alert and oriented to person, place, and time.  Psychiatric:        Mood and Affect: Mood normal.        Behavior: Behavior normal.        Thought Content: Thought content normal.        Judgment: Judgment normal.    BP (!) 79/60   Pulse 82   Temp 97.9 F (36.6 C)   Ht '5\' 7"'$  (1.702 m)   Wt 299 lb 12.8 oz (136 kg)   SpO2 98%   BMI 46.96 kg/m  Wt Readings from Last 3 Encounters:  06/10/21 299 lb 12.8 oz (136 kg)  06/03/21 (!) 305 lb 6.4 oz (138.5 kg)  05/27/21 (!) 306 lb 15.9 oz (139.3 kg)    Immunization History  Administered Date(s) Administered   Influenza Split 10/24/2011    Influenza,inj,Quad PF,6+ Mos 07/23/2017, 09/06/2018, 09/18/2019   Pneumococcal Conjugate-13 11/01/2017   Pneumococcal Polysaccharide-23 10/24/2011   Tdap 04/07/2015    Diabetic Foot Exam - Simple   No data filed     Lab Results  Component Value Date   TSH 2.519 04/30/2021   Lab Results  Component Value Date   WBC 5.0 06/03/2021   HGB 12.7 (L) 06/03/2021   HCT 41.2 06/03/2021   MCV 84.8 06/03/2021   PLT 254 06/03/2021   Lab Results  Component Value Date   NA 138 06/03/2021   K 4.3 06/03/2021   CO2 24 06/03/2021   GLUCOSE 111 (H) 06/03/2021   BUN 21 (H) 06/03/2021   CREATININE 1.56 (H) 06/03/2021   BILITOT 0.7 05/23/2021   ALKPHOS 62 05/23/2021   AST 19 05/23/2021   ALT 18 05/23/2021   PROT 7.0 05/23/2021   ALBUMIN 2.7 (L) 05/23/2021   CALCIUM 9.9 06/03/2021   ANIONGAP 7 06/03/2021   GFR 58.42 (L) 01/22/2015   Lab Results  Component Value Date   CHOL 149 05/12/2021  CHOL 188 10/28/2020   CHOL 125 03/17/2019   Lab Results  Component Value Date   HDL 31 (L) 05/12/2021   HDL 37 (L) 10/28/2020   HDL 34 (L) 03/17/2019   Lab Results  Component Value Date   LDLCALC 94 05/12/2021   LDLCALC 107 (H) 10/28/2020   LDLCALC 43 03/17/2019   Lab Results  Component Value Date   TRIG 120 05/12/2021   TRIG 148 05/04/2021   TRIG 182 (H) 05/01/2021   Lab Results  Component Value Date   CHOLHDL 4.8 05/12/2021   CHOLHDL 5.1 (H) 10/28/2020   CHOLHDL 3.7 03/17/2019   Lab Results  Component Value Date   HGBA1C 9.4 (H) 04/30/2021   HGBA1C 8.6 (H) 10/28/2020   HGBA1C 7.5 (A) 06/24/2020   HGBA1C 7.5 06/24/2020   HGBA1C 7.5 (A) 06/24/2020   HGBA1C 7.5 (A) 06/24/2020       Assessment & Plan:   Problem List Items Addressed This Visit       Cardiovascular and Mediastinum   Hypotension - Primary   Relevant Orders   EKG 12-Lead: EKG: unchanged from previous tracings.  1140 AM: Spoke with patient's primary cardiology team, requested that Entresto be discontinued  and losartan be held if systolic B/P less than [id number]. Patient is to return to Cardiology office or ED if hypotension persists and he becomes symptomatic. Maintain follow up with cardiologist in 2 wks. Also recommended that patient liberalize salt and fluid intake today.  Discussed recommendations with patient, demonstrated understanding and agreed with treatment plan     Endocrine   Type 2 diabetes mellitus with stage 3 chronic kidney disease, with long-term current use of insulin (HCC) (Chronic)   Relevant Orders   Hemoglobin A1c   Lipid Panel   Ambulatory referral to Urology   POCT URINALYSIS DIP (CLINITEK) (Completed)   Other Visit Diagnoses     Chronic congestive heart failure, unspecified heart failure type ([place])       Relevant Orders   Lipid Panel   Hematuria, unspecified type       Relevant Orders   Ambulatory referral to Urology   Urinalysis, dipstick only Hematuria likely related to CKD 3, will refer to urology for further evaluation given patient significant medical history   Follow up in 3 mths for reevaluation of chronic illnesses    I have discontinued Mykal L. Arata's Entresto. I am also having him maintain his acetaminophen, nitroGLYCERIN, docusate sodium, allopurinol, ALPRAZolam, apixaban, empagliflozin, pantoprazole, sertraline, spironolactone, traZODone, polyethylene glycol, traMADol, diclofenac Sodium, torsemide, amiodarone, metFORMIN, and insulin aspart protamine - aspart.  No orders of the defined types were placed in this encounter.    Teena Dunk, NP

## 2021-06-10 NOTE — Patient Instructions (Signed)
You were seen today in the [facility] for follow up from your recent admission. Labs were collected, results will be available via MyChart or, if abnormal, you will be contacted by clinic staff. As discussed during your visit, please discontinue your Entresto and hold Losartan is systolic B/P less than [id number].Please continue to monitor your B/P at home, if it remains low and you become symptomatic, please go to the ED immediately for further evaluation and management.  Please maintain your follow up with cardiology in 2 wks for reevaluation. Follow up at the [facility] in 3 mths, or sooner as needed.

## 2021-06-10 NOTE — Telephone Encounter (Signed)
Talked to wife, [name] was currently at a doctor visit. Told her would call him back later.

## 2021-06-11 ENCOUNTER — Other Ambulatory Visit: Payer: Self-pay

## 2021-06-11 ENCOUNTER — Other Ambulatory Visit: Payer: Self-pay | Admitting: Nurse Practitioner

## 2021-06-11 ENCOUNTER — Ambulatory Visit: Payer: Medicaid Other | Attending: Physician Assistant | Admitting: Physical Therapy

## 2021-06-11 VITALS — BP 107/88 | HR 98

## 2021-06-11 DIAGNOSIS — R2689 Other abnormalities of gait and mobility: Secondary | ICD-10-CM | POA: Diagnosis not present

## 2021-06-11 DIAGNOSIS — M6281 Muscle weakness (generalized): Secondary | ICD-10-CM | POA: Diagnosis not present

## 2021-06-11 DIAGNOSIS — Z7409 Other reduced mobility: Secondary | ICD-10-CM | POA: Insufficient documentation

## 2021-06-11 DIAGNOSIS — R899 Unspecified abnormal finding in specimens from other organs, systems and tissues: Secondary | ICD-10-CM

## 2021-06-11 LAB — URINALYSIS, DIPSTICK ONLY
Bilirubin, UA: NEGATIVE
Nitrite, UA: NEGATIVE
Specific Gravity, UA: 1.021 (ref 1.005–1.030)
Urobilinogen, Ur: 0.2 mg/dL (ref 0.2–1.0)
pH, UA: 6 (ref 5.0–7.5)

## 2021-06-11 LAB — HEMOGLOBIN A1C
Est. average glucose Bld gHb Est-mCnc: 171 mg/dL
Hgb A1c MFr Bld: 7.6 % — ABNORMAL HIGH (ref 4.8–5.6)

## 2021-06-11 LAB — LIPID PANEL
Chol/HDL Ratio: 4.7 ratio (ref 0.0–5.0)
Cholesterol, Total: 174 mg/dL (ref 100–199)
HDL: 37 mg/dL — ABNORMAL LOW (ref >39)
LDL Chol Calc (NIH): 115 mg/dL — ABNORMAL HIGH (ref 0–99)
Triglycerides: 120 mg/dL (ref 0–149)
VLDL Cholesterol Cal: 22 mg/dL (ref 5–40)

## 2021-06-11 MED ORDER — NITROGLYCERIN 0.4 MG SL SUBL: 0.4000 mg | SUBLINGUAL_TABLET | SUBLINGUAL | 12 refills | Status: DC | PRN

## 2021-06-11 MED ORDER — "INSULIN SYRINGE-NEEDLE U-100 31G X 5/16"" 0.5 ML MISC"
11 refills | Status: DC
  Filled 2021-06-11: qty 100, 34d supply, fill #0
  Filled 2021-08-12: qty 100, 34d supply, fill #1

## 2021-06-11 NOTE — Patient Instructions (Signed)
Access Code: [email or web address] URL: [email or web address] Date: 06/11/2021 Prepared by: Shearon Balo  Exercises Sitting Knee Extension with Resistance - 1 x daily - 7 x weekly - 3 sets - 10 reps Seated March with Resistance - 1 x daily - 7 x weekly - 3 sets - 10 reps Seated Hip Abduction - 1 x daily - 7 x weekly - 3 sets - 10 reps Seated Hamstring Curl with Anchored Resistance - 1 x daily - 7 x weekly - 3 sets - 10 reps Seated Hip Adduction Squeeze with Ball - 1 x daily - 7 x weekly - 3 sets - 10 reps

## 2021-06-11 NOTE — Therapy (Signed)
[place], [place], [address] Phone: [phone]   Fax:  [phone]  Physical Therapy Evaluation  Patient Details  Name: Jeffrey [name] MRN: [id number] Date of Birth: 1975-10-11 Referring Provider (PT): Cathlyn Parsons, PA-C  Encounter Date: 06/11/2021   PT End of Session - 06/11/21 1607     Visit Number 1    Number of Visits 16    Date for PT Re-Evaluation 08/06/21    Authorization Type MCD - [employer]    PT Start Time 1140   pt arrived late   PT Stop Time 1215    PT Time Calculation (min) 35 min    Equipment Utilized During Treatment Gait belt    Activity Tolerance Patient tolerated treatment well    Behavior During Therapy [facility] for tasks assessed/performed             Past Medical History:  Diagnosis Date   Anxiety    Aspirin allergy    Childhood asthma    Chronic systolic CHF (congestive heart failure) ([place])    a. EF 20-25% in 2012. b. EF 45-50% in 10/2011 with nonischemic nuc - presumed NICM. c. 12/2014 Echo: Sev depressed LV fxn, sev dil LV, mild LVH, mild MR, sev dil LA, mildly reduced RV fxn.   CKD (chronic kidney disease) stage 2, GFR 60-89 ml/min    H/O vasectomy 12/2019   High cholesterol    Hypertension    Morbid obesity ([place])    Nephrolithiasis    OSA on CPAP    Paroxysmal atrial fibrillation ([place])    Presumed NICM    a. 04/2014 Myoview: EF 26%, glob HK, sev glob HK, ? prior infarct;  b. Never cathed 2/2 CKD.   Renal cell carcinoma ([place])    a. s/p Rt robotic assisted partial converted to radical nephrectomy on 01/2013.   Troponin level elevated    a. 04/2014, 12/2014: felt due to CHF.   Type II diabetes mellitus ([place])    Ventricular tachycardia ([place])    a. appropriate ICD therapy 12/2017    Past Surgical History:  Procedure Laterality Date   APPENDECTOMY  07/2004   BIOPSY  02/10/2021   Procedure: BIOPSY;  Surgeon: Gatha Mayer, MD;  Location: [facility] ENDOSCOPY;  Service:  Endoscopy;;   CARDIAC CATHETERIZATION N/A 05/17/2015   Procedure: Right/Left Heart Cath and Coronary Angiography;  Surgeon: Jolaine Artist, MD;  Location: [place] [facility] LAB;  Service: Cardiovascular;  Laterality: N/A;   CARDIOVERSION N/A 04/30/2021   Procedure: CARDIOVERSION;  Surgeon: Larey Dresser, MD;  Location: [facility] ENDOSCOPY;  Service: Cardiovascular;  Laterality: N/A;   COLONOSCOPY WITH PROPOFOL N/A 02/10/2021   Procedure: COLONOSCOPY WITH PROPOFOL;  Surgeon: Gatha Mayer, MD;  Location: [facility] ENDOSCOPY;  Service: Endoscopy;  Laterality: N/A;   EP IMPLANTABLE DEVICE N/A 05/17/2015   Procedure: SubQ ICD Implant;  Surgeon: Will Meredith Leeds, MD;  Location: [place] CV LAB;  Service: Cardiovascular;  Laterality: N/A;   ESOPHAGOGASTRODUODENOSCOPY (EGD) WITH PROPOFOL N/A 02/10/2021   Procedure: ESOPHAGOGASTRODUODENOSCOPY (EGD) WITH PROPOFOL;  Surgeon: Gatha Mayer, MD;  Location: [facility];  Service: Endoscopy;  Laterality: N/A;   RIGHT HEART CATH N/A 05/13/2021   Procedure: RIGHT HEART CATH;  Surgeon: Jolaine Artist, MD;  Location: [place] [facility];  Service: Cardiovascular;  Laterality: N/A;   ROBOTIC ASSISTED LAPAROSCOPIC LYSIS OF ADHESION  01/13/2013   Procedure: ROBOTIC ASSISTED LAPAROSCOPIC LYSIS OF ADHESION EXTENSIVE;  Surgeon: Alexis Frock, MD;  Location: [facility] ORS;  Service: Urology;;   ROBOTIC ASSITED PARTIAL NEPHRECTOMY Right 01/13/2013   Procedure: ROBOTIC ASSITED PARTIAL NEPHRECTOMY CONVERTED TO ROBOTIC ASSISTED RIGHT RADICAL NEPHRECTOMY;  Surgeon: Alexis Frock, MD;  Location: [facility] ORS;  Service: Urology;  Laterality: Right;   SUBQ ICD CHANGEOUT N/A 05/16/2020   Procedure: SUBQ ICD CHANGEOUT;  Surgeon: [name], MD;  Location: [place] [facility] LAB;  Service: Cardiovascular;  Laterality: N/A;   SUBQ ICD CHANGEOUT N/A 05/15/2020   Procedure: SUBQ ICD CHANGEOUT;  Surgeon: [name], MD;  Location: [place] [facility] LAB;  Service: Cardiovascular;  Laterality: N/A;    TEE WITHOUT CARDIOVERSION N/A 04/30/2021   Procedure: TRANSESOPHAGEAL ECHOCARDIOGRAM (TEE);  Surgeon: Larey Dresser, MD;  Location[facility] ENDOSCOPY;  Service: Cardiovascular;  Laterality: N/A;   VASECTOMY      Vitals:   06/11/21 1147  BP: 107/88  Pulse: 98  SpO2: 99%      Subjective Assessment - 06/11/21 1601     Subjective Jeffrey [name] is a 45 y.o. male who presents to clinic with chief complaint of weakness and fatigue following hospital stay for 2 months following cardiogenic shock (see below for details of hospital stay).  Sleeps with CPAP, 2 pillows. He plans to have LVAD procedure in the near future. Denies pain.  Red flags: none acute (denies weight gain, cough, swelling, chest pain, acute change in activity tolerance, change in SOB, SOB at rest).  Functional limitations/goals: walking, standing, transfers.  Home environment: lives with wife at home, 5 STE with rail.  Assistive device: FWW.   Hand dominance: R.  Falls: no falls.    Pertinent History Significant PMH:  TAKE VITALS, history of anxiety, cardiogenic shock, chronic diastolic congestive heart failure, childhood asthma, chronic systolic congestive heart failure, CKD stage II-3, morbid obesity, hyperlipidemia, hypertension, PAF, nonischemic cardiomyopathy status post ICD renal cell carcinoma with nephrectomy, diabetes mellitus, defibrillator, R knee pain,            From EMR (DC summary from recent hospital stay):   "Brief HPI:   Jeffrey [name] is a 45 y.o. right-handed male with history of anxiety, chronic diastolic congestive heart failure childhood asthma chronic systolic congestive heart failure CKD stage II-3 morbid obesity hyperlipidemia hypertension PAF nonischemic cardiomyopathy status post ICD renal cell carcinoma with nephrectomy diabetes mellitus.  Per chart review lives with spouse independent and sedentary.  Presented 04/29/2021 with intermittent nausea as well as shortness of breath and lower extremity edema.   In the ER he was found to be in atrial fibrillation with RVR frequent runs of NSVT.  Denied chest pain.  Creatinine 1.99 from baseline 1.49 troponin 75-64.  Chest x-ray showed mild CHF.  Cardiology services consulted placed on IV diuresis.  Echocardiogram with ejection fraction of less than [id number] grade 3 diastolic dysfunction.  He did undergo emergent TEE guided DCCV 7/27 due to inability to control heart rate.  Not a candidate for ablation due to body habitus.  He had been placed on amiodarone stopped due to concern for toxicity and adjusted accordingly.  He remained on Eliquis as directed.  Intermittent bouts of orthostasis adjustments per cardiology services.  Hospital course complicated by acute hypoxemic respiratory failure as noted respiratory cultures positive for rare S-aureus treated with linezolid and cefepime.  AKI on CKD creatinine peaked 4.65 felt to be possibly related to diuresis his torsemide was adjusted.  He did have some urinary obstruction likely playing a role in AKI urology service consulted initially with  Foley tube placed later removed.  Patient currently awaiting plan for LVAD once stable from a cardiac standpoint.  Patient did have acute gout flareup responding well to prednisone as well as colchicine.  Therapy evaluations completed due to patient decreased functional mobility was admitted for a comprehensive rehab program.     Hospital Course: Jeffrey [name] was admitted to rehab 05/22/2021 for inpatient therapies to consist of PT, ST and OT at least three hours five days a week. Past admission physiatrist, therapy team and rehab RN have worked together to provide customized collaborative inpatient rehab.  Pertain to patient's debility related to acute on chronic systolic congestive heart failure/cardiogenic shock ultimate plan for LVAD.  He continued to be followed closely by cardiology team.  He remained on chronic Eliquis as advised cardiac rate controlled.  Mood stabilization with Zoloft  he was attending full therapies.  Cardiac rate controlled amiodarone as advised.  AKI on CKD with creatinine stabilized 1.77.  Pain managed use of tramadol.  Blood sugars controlled insulin therapy as directed.  Close monitoring of blood pressure again recommendations and changes made per cardiology services.  Noted obesity BMI 47 dietary follow-up.     Blood pressures were monitored on TID basis and soft and monitored   Diabetes has been monitored with ac/hs CBG checks and SSI was use prn for tighter BS control.      Rehab course: During patient's stay in rehab weekly team conferences were held to monitor patient's progress, set goals and discuss barriers to discharge. At admission, patient required minimal guard 140 feet supervision sit to supine   Physical exam.  Blood pressure 107/95 pulse 101 temperature 98.5 respirations 18 oxygen saturation 99% room air Constitutional.  No acute distress HEENT Head.  Normocephalic and atraumatic Eyes.  Pupils round and reactive to light no discharge.nystagmus Neck.  Supple nontender no JVD without thyromegaly Cardiac regular rate rhythm not any extra sounds or murmur heard Abdomen.  Soft nontender positive bowel sounds without rebound Respiratory effort normal no respiratory distress without wheeze Skin.  Intact Neurologic.  Alert oriented x3.  Moves all extremities   He/She  has had improvement in activity tolerance, balance, postural control as well as ability to compensate for deficits. He/She has had improvement in functional use RUE/LUE  and RLE/LLE as well as improvement in awareness.  Patient transfers supine to sit supervision transfers sit to stand with supervision and ambulates without assistive device.  He can increase his ambulation up to 150 feet.  Gather his belongings for activities daily when homemaking.  Oxygen saturation remained greater than 96% on room air.  Full teaching completed plan discharged home"    [facility] PT Assessment -  06/11/21 0001       Assessment   Medical Diagnosis Referral diagnosis: Chronic systolic (congestive) heart failure (I50.22)    Referring Provider (PT) Cathlyn Parsons, PA-C    Onset Date/Surgical Date 03/11/21    Hand Dominance Right    Next MD Visit 9/19    Prior Therapy in hospital      Precautions   Precaution Comments Significant PMH:  TAKE VITALS, history of anxiety, cardiogenic shock, chronic diastolic congestive heart failure, childhood asthma, chronic systolic congestive heart failure, CKD stage II-3, morbid obesity, hyperlipidemia, hypertension, PAF, nonischemic cardiomyopathy status post ICD renal cell carcinoma with nephrectomy, diabetes mellitus, defibrillator, R knee pain      Restrictions   Other Position/Activity Restrictions no high exertion activities or heavy lifting      Balance  Screen   Has the patient fallen in the past 6 months No      Prior Function   Level of Independence Independent      Observation/Other Assessments   Focus on Therapeutic Outcomes (FOTO)  next visit      Functional Tests   Functional tests Other;Sit to Stand      Sit to Stand   Comments 30'': next visit      Other:   Other/ Comments 10 m max gait speed: .59 m/s      ROM / Strength   AROM / PROM / Strength Strength      Strength   Strength Assessment Site Hip;Knee    Right/Left Hip Right;Left    Right Hip Flexion 4/5    Left Hip Flexion 4/5    Right/Left Knee Right;Left    Right Knee Flexion 3+/5    Right Knee Extension 3+/5    Left Knee Flexion 4/5    Left Knee Extension 4/5                        Objective measurements completed on examination: See above findings.             Upper Extremity Functional Index Score:  /80   PT Education - 06/11/21 1603     Education Details POC, diagnosis, prognosis, HEP.  Pt educated via explanation, demonstration, and handout (HEP).  Pt confirms understanding verbally.              PT Short Term  Goals - 06/11/21 1623       PT SHORT TERM GOAL #1   Title Pt will be compliant with innitial HEP    Status New    Target Date 07/02/21               PT Long Term Goals - 06/11/21 1611       PT LONG TERM GOAL #1   Title Jeffrey [name] will be >75% HEP compliant throughout therapy to improve carryover between sessions and facilitate independent management of condition  target date: 08/06/2021    Baseline no HEP    Status New      PT LONG TERM GOAL #2   Title Jeffrey [name] will improve 10 meter max gait speed to .7 m/s (.1 m/s MCID) to show functional improvement in ambulation  EVAL: .59 m/s  target date: 08/06/2021    Baseline .7 m/s    Status New      PT LONG TERM GOAL #3   Title Jeffrey [name] will improve the following MMTs to >/= 5/5 to show improvement in strength:  hip flexion, knee ext, knee flexion  EVAL: 3+/5 - 4/5 (see flowsheet)  target date: 08/06/2021    Baseline R knee ext and flexion 3+/5, all others 4/5    Status New      PT LONG TERM GOAL #4   Title FOTO goal (next visit)      PT LONG TERM GOAL #5   Title 50'' STS goal (next visit)                    Plan - 06/11/21 1608     Clinical Impression Statement Jeffrey [name] is a 45 y.o. male who presents to clinic with signs and sxs consistent with deconditioning following prolonged hospital stay secondary to cardiogenic shock.  Pt with complex medical history.  Eval limited d/t late arrival.  Pt  presents with impairments/deficits in: LE strength, endurance, gait, balance.  Activity limitations include: walking, steps, squatting, standing, transfers.  Participation limitations include: ambulation in community, ADLs involving standing.  Pt will benefit from skilled therapy to address the listed deficits in order to achieve functional goals, enable safety and independence in completion of daily tasks, and return to PLOF.  Pt to have initial visit with physical rehab on 9/19, we will hold PT appts until  after this to see if cardiac rehab may be more appropriate.  Pt will complete HEP until this time.    Stability/Clinical Decision Making Evolving/Moderate complexity    Clinical Decision Making Moderate    Rehab Potential Fair    PT Frequency 2x / week    PT Duration 8 weeks    PT Treatment/Interventions ADLs/Self Care Home Management;Iontophoresis '4mg'$ /ml Dexamethasone;Gait training;Therapeutic activities;Therapeutic exercise;Neuromuscular re-education;Manual techniques;Dry needling    PT Next Visit Plan Take FOTO and 30'' STS - make goals; gradual LE strengthening and gentle standing endurance progression    PT Home Exercise Plan [id number]    Consulted and Agree with Plan of Care Patient             Patient will benefit from skilled therapeutic intervention in order to improve the following deficits and impairments:  Abnormal gait, Decreased endurance, Decreased strength, Difficulty walking, Decreased balance  Visit Diagnosis: Other abnormalities of gait and mobility  Muscle weakness  Decreased functional mobility and endurance     Problem List Patient Active Problem List   Diagnosis Date Noted   Cardiogenic shock ([place])    Atrial fibrillation with RVR ([place]) 04/30/2021   Acute hypoxemic respiratory failure (H[facility])    Right wrist pain 04/16/2021   Absolute anemia    Rectal bleeding    Iron deficiency 12/25/2020   Long term current use of anticoagulant - apixaban 12/25/2020   Paroxysmal atrial fibrillation ([facility])    Lactic acidosis 11/22/2020   Hypotension 11/22/2020   Aspiration into airway    ICD (implantable cardioverter-defibrillator) battery depletion 05/15/2020   Endotracheal tube present    Respiratory failure ([place])    Acute encephalopathy    Acute pulmonary edema (H[facility])    NICM (nonischemic cardiomyopathy) ([place]) 04/24/2020   S/P vasectomy 12/20/2019   Anxiety 12/20/2019   Hemoglobin A1C between 7% and 9% indicating borderline diabetic control 12/20/2019    Seasonal allergies 12/20/2019   Insomnia 12/20/2019   Vasectomy evaluation 06/20/2019   Visual problems 06/20/2019   Blurry vision, bilateral 06/20/2019   Hyperglycemia 02/13/2019   Hyperosmolar (nonketotic) coma ([place]) 01/19/2019   AICD (automatic cardioverter/defibrillator) present 01/19/2019   Hyperlipidemia 12/07/2018   Follow-up exam 11/22/2018   Class 3 severe obesity due to excess calories with serious comorbidity and body mass index (BMI) of 45.0 to 49.9 in adult ([place]) 11/22/2018   Dizziness 11/22/2018   Lingular pneumonia 08/04/2018   Ventricular tachycardia ([place]) 12/30/2017   PAF (paroxysmal atrial fibrillation) ([place])    Dilated cardiomyopathy ([place])    Pollen allergy 02/17/2017   Dyslipidemia 02/17/2017   Hematochezia 11/08/2015   CKD (chronic kidney disease), stage III ([place]) 06/20/2015   Cardiac defibrillator in situ    Chronic systolic CHF (congestive heart failure) ([place]) 03/11/2015   Gouty arthritis 03/11/2015   Chest pain 12/28/2014   Acute renal failure superimposed on stage 3 chronic kidney disease ([place]) 04/08/2014   Aspirin allergy 04/08/2014   Renal cell carcinoma ([place])    Gout attack 11/10/2011   Morbid obesity ([place]) 10/23/2011   OSA (obstructive sleep  apnea) 10/23/2011   Type 2 diabetes mellitus with stage 3 chronic kidney disease, with long-term current use of insulin ([place]) 10/22/2011   Hyperlipidemia associated with type 2 diabetes mellitus ([place]) 12/25/2010   Hypertension associated with diabetes ([place]) 12/25/2010    Mathis Dad, PT 06/11/2021, 4:24 PM  [facility] [address] [place], [place], [address] Phone: [phone]   Fax:  [phone]  Name: Jeffrey [name] MRN: [id number] Date of Birth: [birth year]

## 2021-06-12 ENCOUNTER — Other Ambulatory Visit: Payer: Self-pay | Admitting: Internal Medicine

## 2021-06-12 ENCOUNTER — Other Ambulatory Visit: Payer: Self-pay

## 2021-06-12 NOTE — Progress Notes (Signed)
Remote ICD transmission.   

## 2021-06-13 ENCOUNTER — Ambulatory Visit (HOSPITAL_COMMUNITY)
Admission: RE | Admit: 2021-06-13 | Discharge: 2021-06-13 | Disposition: A | Discharge status: Home / Self Care | Payer: Medicaid Other | Source: Ambulatory Visit | Attending: Internal Medicine | Admitting: Internal Medicine

## 2021-06-13 ENCOUNTER — Other Ambulatory Visit: Payer: Self-pay

## 2021-06-13 ENCOUNTER — Telehealth (HOSPITAL_COMMUNITY): Payer: Self-pay | Admitting: Cardiology

## 2021-06-13 DIAGNOSIS — I5022 Chronic systolic (congestive) heart failure: Secondary | ICD-10-CM

## 2021-06-13 LAB — BASIC METABOLIC PANEL
Anion gap: 8 (ref 5–15)
BUN: 23 mg/dL — ABNORMAL HIGH (ref 6–20)
CO2: 22 mmol/L (ref 22–32)
Calcium: 9.4 mg/dL (ref 8.9–10.3)
Chloride: 106 mmol/L (ref 98–111)
Creatinine, Ser: 2.08 mg/dL — ABNORMAL HIGH (ref 0.61–1.24)
GFR, Estimated: 39 mL/min — ABNORMAL LOW (ref >60)
Glucose, Bld: 138 mg/dL — ABNORMAL HIGH (ref 70–99)
Potassium: 3.8 mmol/L (ref 3.5–5.1)
Sodium: 136 mmol/L (ref 135–145)

## 2021-06-13 MED ORDER — NITROGLYCERIN 0.4 MG SL SUBL
0.4000 mg | SUBLINGUAL_TABLET | SUBLINGUAL | 12 refills | Status: DC | PRN
  Filled 2021-06-13: qty 25, 30d supply, fill #0

## 2021-06-13 MED ORDER — LOSARTAN POTASSIUM 25 MG PO TABS
12.5000 mg | ORAL_TABLET | Freq: Every day | ORAL | 3 refills | Status: DC
  Filled 2021-06-13: qty 45, 90d supply, fill #0

## 2021-06-13 NOTE — Telephone Encounter (Signed)
-----   Message from Rafael Bihari, [place] sent at 06/13/2021 12:06 PM EDT ----- Kidney function elevated.   - Please make sure he has STOPPED his Entresto and restarted losartan (he had significantly low BPs recently).   - Please make sure he has not been taking extra torsemide. Repeat BMET in 7 days. Encourage adequate PO fluids (while keeping less than 2L/day).

## 2021-06-13 NOTE — Telephone Encounter (Signed)
Patient called.  Patient aware via wife. Repeat labs 9/15  -wife reports she thought patient was still on entresto-will make sure changes are made. Pt is taking torsemide- about every other day. Advised once it gets to the point where torsemide is needed daily to be sure to call our office for further advised. In the meantime return for labs as scheduled

## 2021-06-16 ENCOUNTER — Ambulatory Visit (INDEPENDENT_AMBULATORY_CARE_PROVIDER_SITE_OTHER): Payer: Medicaid Other | Admitting: Urology

## 2021-06-16 ENCOUNTER — Encounter: Payer: Self-pay | Admitting: Surgery

## 2021-06-16 ENCOUNTER — Encounter: Payer: Self-pay | Admitting: Urology

## 2021-06-16 ENCOUNTER — Other Ambulatory Visit: Payer: Self-pay

## 2021-06-16 VITALS — BP 96/70 | HR 76 | Ht 67.0 in | Wt 296.0 lb

## 2021-06-16 DIAGNOSIS — R310 Gross hematuria: Secondary | ICD-10-CM | POA: Diagnosis not present

## 2021-06-16 DIAGNOSIS — Z85528 Personal history of other malignant neoplasm of kidney: Secondary | ICD-10-CM | POA: Diagnosis not present

## 2021-06-16 DIAGNOSIS — R319 Hematuria, unspecified: Secondary | ICD-10-CM | POA: Diagnosis not present

## 2021-06-16 NOTE — Progress Notes (Signed)
06/16/21 11:30 AM   [name] [birth year] [id number]  CC: Gross hematuria, history of RCC status post nephrectomy  HPI: I saw Mr. Stracener and his wife today for evaluation of the above issues.  He is a very comorbid 45 year old male with congestive heart failure currently undergoing evaluation for LVAD who was recently hospitalized at the end of July 2022 for decompensated heart failure and atrial fibrillation with respiratory distress requiring Lasix drip.  He had problems with catheter drainage at that time and bloody urine at time of Foley placement, and ultimately Dr. Junious Silk replaced the catheter on 05/01/2021.  He is anticoagulated with Eliquis for history of atrial fibrillation.  He has been having some pink urine since the time of catheter replacement, and this is persisted even after Foley removal.  He denies any dysuria or urgency/frequency.  Urinalysis today with 6-10 WBCs, greater than 30 RBCs, no bacteria, no yeast, nitrite negative, no leukocytes.  He has CKD with most recent renal function showing a creatinine of 2.08, EGFR 39.  His history is also notable for a robotic right radical nephrectomy for reported 4 cm RCC by Dr. Tresa Moore in [place] in 2014.  Recent CT abdomen pelvis without contrast on 05/12/2021 was benign.  PMH: Past Medical History:  Diagnosis Date   Anxiety    Aspirin allergy    Childhood asthma    Chronic systolic CHF (congestive heart failure) ([place])    a. EF 20-25% in 2012. b. EF 45-50% in 10/2011 with nonischemic nuc - presumed NICM. c. 12/2014 Echo: Sev depressed LV fxn, sev dil LV, mild LVH, mild MR, sev dil LA, mildly reduced RV fxn.   CKD (chronic kidney disease) stage 2, GFR 60-89 ml/min    H/O vasectomy 12/2019   High cholesterol    Hypertension    Morbid obesity ([place])    Nephrolithiasis    OSA on CPAP    Paroxysmal atrial fibrillation ([place])    Presumed NICM    a. 04/2014 Myoview: EF 26%, glob HK, sev glob HK, ? prior infarct;  b. Never cathed  2/2 CKD.   Renal cell carcinoma ([place])    a. s/p Rt robotic assisted partial converted to radical nephrectomy on 01/2013.   Troponin level elevated    a. 04/2014, 12/2014: felt due to CHF.   Type II diabetes mellitus ([place])    Ventricular tachycardia ([place])    a. appropriate ICD therapy 12/2017    Surgical History: Past Surgical History:  Procedure Laterality Date   APPENDECTOMY  07/2004   BIOPSY  02/10/2021   Procedure: BIOPSY;  Surgeon: Gatha Mayer, MD;  Location: [facility];  Service: Endoscopy;;   CARDIAC CATHETERIZATION N/A 05/17/2015   Procedure: Right/Left Heart Cath and Coronary Angiography;  Surgeon: Jolaine Artist, MD;  Location: [facility];  Service: Cardiovascular;  Laterality: N/A;   CARDIOVERSION N/A 04/30/2021   Procedure: CARDIOVERSION;  Surgeon: Larey Dresser, MD;  Location: [facility] ENDOSCOPY;  Service: Cardiovascular;  Laterality: N/A;   COLONOSCOPY WITH PROPOFOL N/A 02/10/2021   Procedure: COLONOSCOPY WITH PROPOFOL;  Surgeon: Gatha Mayer, MD;  Location: [facility];  Service: Endoscopy;  Laterality: N/A;   EP IMPLANTABLE DEVICE N/A 05/17/2015   Procedure: SubQ ICD Implant;  Surgeon: Will Meredith Leeds, MD;  Location: [facility];  Service: Cardiovascular;  Laterality: N/A;   ESOPHAGOGASTRODUODENOSCOPY (EGD) WITH PROPOFOL N/A 02/10/2021   Procedure: ESOPHAGOGASTRODUODENOSCOPY (EGD) WITH PROPOFOL;  Surgeon: Gatha Mayer, MD;  Location: [facility];  Service: Endoscopy;  Laterality: N/A;   RIGHT HEART CATH N/A 05/13/2021   Procedure: RIGHT HEART CATH;  Surgeon: Jolaine Artist, MD;  Location: [facility];  Service: Cardiovascular;  Laterality: N/A;   ROBOTIC ASSISTED LAPAROSCOPIC LYSIS OF ADHESION  01/13/2013   Procedure: ROBOTIC ASSISTED LAPAROSCOPIC LYSIS OF ADHESION EXTENSIVE;  Surgeon: Alexis Frock, MD;  Location: [facility];  Service: Urology;;   ROBOTIC ASSITED PARTIAL NEPHRECTOMY Right 01/13/2013   Procedure: ROBOTIC ASSITED PARTIAL  NEPHRECTOMY CONVERTED TO ROBOTIC ASSISTED RIGHT RADICAL NEPHRECTOMY;  Surgeon: Alexis Frock, MD;  Location: [facility];  Service: Urology;  Laterality: Right;   SUBQ ICD CHANGEOUT N/A 05/16/2020   Procedure: SUBQ ICD CHANGEOUT;  Surgeon: [name], MD;  Location: [facility];  Service: Cardiovascular;  Laterality: N/A;   SUBQ ICD CHANGEOUT N/A 05/15/2020   Procedure: SUBQ ICD CHANGEOUT;  Surgeon: [name], MD;  Location: [facility];  Service: Cardiovascular;  Laterality: N/A;   TEE WITHOUT CARDIOVERSION N/A 04/30/2021   Procedure: TRANSESOPHAGEAL ECHOCARDIOGRAM (TEE);  Surgeon: Larey Dresser, MD;  Location: [facility] ENDOSCOPY;  Service: Cardiovascular;  Laterality: N/A;   VASECTOMY        Family History: Family History  Problem Relation Age of Onset   Hypertension Mother    Diabetes Maternal Aunt    Heart attack Neg Hx    Stroke Neg Hx    Colon cancer Neg Hx    Pancreatic cancer Neg Hx    Stomach cancer Neg Hx    Liver cancer Neg Hx    Esophageal cancer Neg Hx     Social History:  reports that he quit smoking about 6 years ago. His smoking use included cigarettes. He has a 5.50 pack-year smoking history. He has never used smokeless tobacco. He reports that he does not currently use alcohol. He reports that he does not currently use drugs after having used the following drugs: Marijuana. Frequency: 14.00 times per week.  Physical Exam: BP 96/70 (BP Location: Left Arm, Patient Position: Sitting, Cuff Size: Large)   Pulse 76   Ht '5\' 7"'  (1.702 m)   Wt 296 lb (134.3 kg)   BMI 46.36 kg/m    Constitutional:  Alert and oriented, No acute distress. Cardiovascular: No clubbing, cyanosis, or edema. Respiratory: Normal respiratory effort, no increased work of breathing. GI: Abdomen is soft, nontender, nondistended, no abdominal masses  Laboratory Data: Reviewed, see HPI  Pertinent Imaging: I have personally viewed and interpreted the CT abdomen pelvis without  contrast dated 05/12/2021 that shows solitary left kidney, no obvious left renal masses but limited by lack of contrast, no obvious bladder lesions.  Assessment & Plan:   Extremely comorbid 45 year old male with heart failure, CKD, history of RCC status post right nephrectomy in 2014 who presents with intermittent pink urine since having a Foley placed at the end of July 2022.  He denies any dysuria or UTI symptoms, and urinalysis is relatively benign aside from microscopic hematuria today.  We discussed common possible etiologies of hematuria including BPH, malignancy, urolithiasis, medical renal disease, and idiopathic. Standard workup recommended by the [facility]UA includes imaging with CT urogram to assess the upper tracts, and cystoscopy. Cytology is performed on patient's with gross hematuria to look for malignant cells in the urine.  MR urogram and cystoscopy to complete hematuria work-up   Nickolas Madrid, MD 06/16/2021  [facility] [address], [place], [place] [address] [phone]

## 2021-06-16 NOTE — Progress Notes (Signed)
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  He was most recently admitted in July 2022 with acute on chronic systolic congestive heart failure and cardiogenic shock with atrial fibrillation with RVR.  He was placed on amiodarone and underwent cardioversion but had recurrent atrial fibrillation requiring a second cardioversion.  2D echocardiogram showed an ejection fraction of less than 20% with an LVIDD of 6.9 cm and LVIDS of 6.2 cm.  There is trivial tricuspid valve regurgitation.  There is no aortic insufficiency.  There is trivial mitral regurgitation.  There is moderate right ventricular systolic dysfunction.  He required norepinephrine and dobutamine which was eventually weaned off.  He developed acute respiratory failure with hypoxemia requiring mechanical ventilation for a while.  He was seen by CCM and felt to have atelectasis.  He diuresed 30 pounds with a Lasix drip.  Diuretic had to be stopped due to hypotension and worsening renal function.  GDMT was limited by hypotension.  LVAD work-up was started.  He underwent right heart cath on 05/13/2021 showing well compensated hemodynamics:  RA = 6 RV = 29/3 PA = 35/12 (22) PCW = 16 Fick cardiac output/index = 5.5/2.3 PVR = 1.1 WU Ao sat = 98% PA sat = 65%, 67%  During that hospitalization his creatinine peaked at 4.65 but improved to 1.58 which was around his  baseline.  He also developed an acute gout flare that was treated with prednisone and colchicine.  He was discharged to CIR weighing 299 pounds.  He was seen back by cardiology on 06/03/2021 and was feeling fairly well.  He was using a walker for balance but his stamina was improving.  He has started outpatient PT.   Past Medical History:  Diagnosis Date   Anxiety    Aspirin allergy    Childhood asthma    Chronic systolic CHF (congestive heart failure) ([place])    a. EF 20-25% in 2012. b. EF 45-50% in 10/2011 with nonischemic nuc - presumed NICM. c. 12/2014 Echo: Sev depressed LV fxn, sev dil LV, mild LVH, mild MR, sev dil LA, mildly reduced RV fxn.   CKD (chronic kidney disease) stage 2, GFR 60-89 ml/min    H/O vasectomy 12/2019   High cholesterol    Hypertension    Morbid obesity ([place])    Nephrolithiasis    OSA on CPAP    Paroxysmal atrial fibrillation ([place])    Presumed NICM    a. 04/2014 Myoview: EF 26%, glob HK, sev glob HK, ? prior infarct;  b. Never cathed 2/2 CKD.   Renal cell carcinoma ([place])    a. s/p Rt robotic assisted partial converted to radical nephrectomy on 01/2013.   Troponin level elevated    a. 04/2014, 12/2014: felt due to CHF.   Type II diabetes mellitus ([place])    Ventricular tachycardia ([place])    a. appropriate ICD therapy 12/2017    Past Surgical History:  Procedure Laterality Date   APPENDECTOMY  07/2004  BIOPSY  02/10/2021   Procedure: BIOPSY;  Surgeon: Gatha Mayer, MD;  Location: [facility];  Service: Endoscopy;;   CARDIAC CATHETERIZATION N/A 05/17/2015   Procedure: Right/Left Heart Cath and Coronary Angiography;  Surgeon: Jolaine Artist, MD;  Location: [place] [facility];  Service: Cardiovascular;  Laterality: N/A;   CARDIOVERSION N/A 04/30/2021   Procedure: CARDIOVERSION;  Surgeon: Larey Dresser, MD;  Location: [facility] ENDOSCOPY;  Service: Cardiovascular;  Laterality: N/A;   COLONOSCOPY WITH PROPOFOL N/A 02/10/2021   Procedure: COLONOSCOPY WITH PROPOFOL;   Surgeon: Gatha Mayer, MD;  Location: [facility] ENDOSCOPY;  Service: Endoscopy;  Laterality: N/A;   EP IMPLANTABLE DEVICE N/A 05/17/2015   Procedure: SubQ ICD Implant;  Surgeon: Will Meredith Leeds, MD;  Location: [place] [facility];  Service: Cardiovascular;  Laterality: N/A;   ESOPHAGOGASTRODUODENOSCOPY (EGD) WITH PROPOFOL N/A 02/10/2021   Procedure: ESOPHAGOGASTRODUODENOSCOPY (EGD) WITH PROPOFOL;  Surgeon: Gatha Mayer, MD;  Location: [facility] ENDOSCOPY;  Service: Endoscopy;  Laterality: N/A;   RIGHT HEART CATH N/A 05/13/2021   Procedure: RIGHT HEART CATH;  Surgeon: Jolaine Artist, MD;  Location: [place] [facility];  Service: Cardiovascular;  Laterality: N/A;   ROBOTIC ASSISTED LAPAROSCOPIC LYSIS OF ADHESION  01/13/2013   Procedure: ROBOTIC ASSISTED LAPAROSCOPIC LYSIS OF ADHESION EXTENSIVE;  Surgeon: Alexis Frock, MD;  Location: [facility] ORS;  Service: Urology;;   ROBOTIC ASSITED PARTIAL NEPHRECTOMY Right 01/13/2013   Procedure: ROBOTIC ASSITED PARTIAL NEPHRECTOMY CONVERTED TO ROBOTIC ASSISTED RIGHT RADICAL NEPHRECTOMY;  Surgeon: Alexis Frock, MD;  Location: [facility];  Service: Urology;  Laterality: Right;   SUBQ ICD CHANGEOUT N/A 05/16/2020   Procedure: SUBQ ICD CHANGEOUT;  Surgeon: [name], MD;  Location: [place] [facility];  Service: Cardiovascular;  Laterality: N/A;   SUBQ ICD CHANGEOUT N/A 05/15/2020   Procedure: SUBQ ICD CHANGEOUT;  Surgeon: [name], MD;  Location: [place] [facility];  Service: Cardiovascular;  Laterality: N/A;   TEE WITHOUT CARDIOVERSION N/A 04/30/2021   Procedure: TRANSESOPHAGEAL ECHOCARDIOGRAM (TEE);  Surgeon: Larey Dresser, MD;  Location: [facility] ENDOSCOPY;  Service: Cardiovascular;  Laterality: N/A;   VASECTOMY      Family History  Problem Relation Age of Onset   Hypertension Mother    Diabetes Maternal Aunt    Heart attack Neg Hx    Stroke Neg Hx    Colon cancer Neg Hx    Pancreatic cancer Neg Hx    Stomach cancer Neg Hx    Liver cancer Neg Hx     Esophageal cancer Neg Hx     Social History Social History   Tobacco Use   Smoking status: Former    Packs/day: 0.25    Years: 22.00    Pack years: 5.50    Types: Cigarettes    Quit date: 04/30/2015    Years since quitting: 6.1   Smokeless tobacco: Never  Vaping Use   Vaping Use: Never used  Substance Use Topics   Alcohol use: Not Currently    Comment: none   Drug use: Not Currently    Frequency: 14.0 times per week    Types: Marijuana    Comment: no longer smoking    Current Outpatient Medications  Medication Sig Dispense Refill   acetaminophen (TYLENOL) 325 MG tablet Take 2 tablets (650 mg total) by mouth every 6 (six) hours as needed for fever.     allopurinol (ZYLOPRIM) 100 MG tablet Take 1 tablet (100 mg total) by mouth daily. 90 tablet 3   ALPRAZolam (  XANAX) 0.5 MG tablet Take 1 tablet (0.5 mg total) by mouth 2 (two) times daily. 60 tablet 0   amiodarone (PACERONE) 200 MG tablet Take 0.5 tablets (100 mg total) by mouth daily. 30 tablet 0   apixaban (ELIQUIS) 5 MG TABS tablet Take 1 tablet (5 mg total) by mouth 2 (two) times daily. 60 tablet 0   diclofenac Sodium (VOLTAREN) 1 % GEL Apply 2 g topically 4 (four) times daily. 100 g 0   docusate sodium (COLACE) 100 MG capsule Take 1 capsule (100 mg total) by mouth 2 (two) times daily. 10 capsule 0   empagliflozin (JARDIANCE) 10 MG TABS tablet TAKE 1 TABLET (10 MG TOTAL) BY MOUTH DAILY BEFORE BREAKFAST. 90 tablet 3   insulin aspart protamine - aspart (NOVOLOG 70/30 MIX) (70-30) 100 UNIT/ML FlexPen Inject 38 Units into the skin 2 (two) times daily. 15 mL 11   metFORMIN (GLUCOPHAGE) 1000 MG tablet Take 1 tablet (1,000 mg total) by mouth 2 (two) times daily with a meal. 60 tablet 11   pantoprazole (PROTONIX) 40 MG tablet Take 1 tablet (40 mg total) by mouth daily. 30 tablet 0   polyethylene glycol (MIRALAX / GLYCOLAX) 17 g packet Take 17 g by mouth daily. 14 each 0   sertraline (ZOLOFT) 25 MG tablet Take 1 tablet (25 mg total) by  mouth daily. 30 tablet 0   spironolactone (ALDACTONE) 25 MG tablet Take 1 tablet (25 mg total) by mouth daily. 30 tablet 6   torsemide (DEMADEX) 20 MG tablet Take 1 tablet (20 mg total) by mouth daily as needed (If wt gain > 3 lb in 24 hr or > 5 lb in 1 week). 30 tablet 2   traMADol (ULTRAM) 50 MG tablet Take 2 tablets (100 mg total) by mouth every 12 (twelve) hours as needed for moderate pain. 30 tablet 0   traZODone (DESYREL) 100 MG tablet Take 1 tablet (100 mg total) by mouth at bedtime. 30 tablet 0   Insulin Syringe-Needle U-100 (TRUEPLUS INSULIN SYRINGE) 31G X 5/16" 0.5 ML MISC USE TO INJECT INSULIN TWICE DAILY. 100 each 11   losartan (COZAAR) 25 MG tablet Take 0.5 tablets (12.5 mg total) by mouth daily. 45 tablet 3   nitroGLYCERIN (NITROSTAT) 0.4 MG SL tablet Place 1 tablet (0.4 mg total) under the tongue every 5 (five) minutes x 3 doses as needed for chest pain. 25 tablet 12   No current facility-administered medications for this visit.    Allergies  Allergen Reactions   Aspirin Shortness Of Breath, Itching and Rash     Burning sensation (Patient reports he tolerates other NSAIDS)    Bee Venom Hives and Swelling   Lisinopril Cough   Shrimp (Diagnostic) Rash   Tomato Rash    Review of Systems  Constitutional:  Positive for unexpected weight change.  HENT:  Negative for dental problem.        Has not seen a dentist in years.  He had a CT of the face during his hospitalization showing no active dental problems.  Eyes: Negative.   Respiratory:  Negative for shortness of breath.   Cardiovascular:  Positive for leg swelling. Negative for chest pain and palpitations.  Gastrointestinal:  Positive for blood in stool.  Endocrine: Negative.   Genitourinary:  Positive for hematuria.  Musculoskeletal: Negative.   Skin: Negative.   Allergic/Immunologic: Negative.   Neurological:  Positive for dizziness and numbness. Negative for syncope.  Hematological: Negative.    Psychiatric/Behavioral:  The patient is nervous/anxious.  Depression   BP 100/68 (BP Location: Right Arm, Patient Position: Sitting, Cuff Size: Large)   Pulse 99   Resp 20   Ht '5\' 7"'$  (1.702 m)   Wt 296 lb (134.3 kg)   SpO2 95% Comment: RA  BMI 46.36 kg/m  Physical Exam Constitutional:      General: He is not in acute distress.    Appearance: He is obese.  HENT:     Head: Normocephalic and atraumatic.     Mouth/Throat:     Mouth: Mucous membranes are moist.     Pharynx: Oropharynx is clear.  Eyes:     Extraocular Movements: Extraocular movements intact.     Conjunctiva/sclera: Conjunctivae normal.     Pupils: Pupils are equal, round, and reactive to light.  Neck:     Vascular: No carotid bruit.  Cardiovascular:     Rate and Rhythm: Normal rate and regular rhythm.     Pulses: Normal pulses.     Heart sounds: Normal heart sounds. No murmur heard. Pulmonary:     Effort: Pulmonary effort is normal.     Breath sounds: Normal breath sounds.  Abdominal:     General: Bowel sounds are normal. There is no distension.     Tenderness: There is no abdominal tenderness.  Musculoskeletal:        General: Swelling present.     Cervical back: Normal range of motion and neck supple.  Lymphadenopathy:     Cervical: No cervical adenopathy.  Skin:    General: Skin is warm and dry.  Neurological:     General: No focal deficit present.     Mental Status: He is alert and oriented to person, place, and time.  Psychiatric:        Mood and Affect: Mood normal.        Behavior: Behavior normal.        Thought Content: Thought content normal.        Judgment: Judgment normal.     Diagnostic Tests:    ECHOCARDIOGRAM REPORT         Patient Name:   Jeffrey [name] Date of Exam: 04/30/2021  Medical Rec #:  [id number]    Height:       68.0 in  Accession #:    [id number]   Weight:       337.0 lb  Date of Birth:  1975-11-18    BSA:          2.551 m  Patient Age:    58 years     BP:            129/102 mmHg  Patient Gender: M            HR:           98 bpm.  Exam Location:  Inpatient   Procedure: 2D Echo, Cardiac Doppler, Color Doppler and Intracardiac             Opacification Agent   Indications:    CHF     History:        Patient has prior history of Echocardiogram examinations,  most                  recent 04/30/2021. CHF and Cardiomyopathy, Defibrillator,                  Arrythmias:Atrial Fibrillation and Cardioversion 04/30/21,  Signs/Symptoms:CKD; Risk Factors:Hypertension,  Dyslipidemia,                  Diabetes and Morbid obesity.     Sonographer:    Dustin Flock  Referring Phys: [place]     1. Left ventricular ejection fraction, by estimation, is <20%. The left  ventricle has severely decreased function. The left ventricle demonstrates  global hypokinesis. The left ventricular internal cavity size was severely  dilated. Left ventricular  diastolic parameters are consistent with Grade III diastolic dysfunction  (restrictive). Elevated left atrial pressure.   2. Right ventricular systolic function is moderately reduced. The right  ventricular size is normal. There is mildly elevated pulmonary artery  systolic pressure. The estimated right ventricular systolic pressure is  99991111 mmHg.   3. Left atrial size was severely dilated.   4. The mitral valve is normal in structure. Trivial mitral valve  regurgitation.   5. The aortic valve is tricuspid. Aortic valve regurgitation is not  visualized. No aortic stenosis is present.   6. The inferior vena cava is dilated in size with <50% respiratory  variability, suggesting right atrial pressure of 15 mmHg.   FINDINGS   Left Ventricle: Left ventricular ejection fraction, by estimation, is  <20%. The left ventricle has severely decreased function. The left  ventricle demonstrates global hypokinesis. Definity contrast agent was  given IV to delineate the left  ventricular  endocardial borders. The left ventricular internal cavity size was  severely dilated. There is no left ventricular hypertrophy. Left  ventricular diastolic parameters are consistent with Grade III diastolic  dysfunction (restrictive). Elevated left atrial  pressure.   Right Ventricle: The right ventricular size is normal. Right vetricular  wall thickness was not well visualized. Right ventricular systolic  function is moderately reduced. There is mildly elevated pulmonary artery  systolic pressure. The tricuspid  regurgitant velocity is 2.42 m/s, and with an assumed right atrial  pressure of 15 mmHg, the estimated right ventricular systolic pressure is  99991111 mmHg.   Left Atrium: Left atrial size was severely dilated.   Right Atrium: Right atrial size was normal in size.   Pericardium: Trivial pericardial effusion is present.   Mitral Valve: The mitral valve is normal in structure. Trivial mitral  valve regurgitation.   Tricuspid Valve: The tricuspid valve is normal in structure. Tricuspid  valve regurgitation is trivial.   Aortic Valve: The aortic valve is tricuspid. Aortic valve regurgitation is  not visualized. No aortic stenosis is present.   Pulmonic Valve: The pulmonic valve was normal in structure. Pulmonic valve  regurgitation is trivial.   Aorta: The aortic root is normal in size and structure.   Venous: The inferior vena cava is dilated in size with less than 50%  respiratory variability, suggesting right atrial pressure of 15 mmHg.   IAS/Shunts: The interatrial septum was not well visualized.      LEFT VENTRICLE  PLAX 2D  LVIDd:         6.90 cm  Diastology  LVIDs:         6.20 cm  LV e' medial:    5.66 cm/s  LV PW:         1.20 cm  LV E/e' medial:  17.0  LV IVS:        1.30 cm  LV e' lateral:   3.81 cm/s  LVOT diam:     2.50 cm  LV E/e' lateral: 25.2  LV SV:  42  LV SV Index:   16  LVOT Area:     4.91 cm      RIGHT VENTRICLE  RV  Basal diam:  3.60 cm  RV S prime:     5.98 cm/s   LEFT ATRIUM              Index       RIGHT ATRIUM           Index  LA diam:        4.80 cm  1.88 cm/m  RA Area:     22.40 cm  LA Vol (A2C):   141.0 ml 55.27 ml/m RA Volume:   71.00 ml  27.83 ml/m  LA Vol (A4C):   135.0 ml 52.92 ml/m  LA Biplane Vol: 147.0 ml 57.62 ml/m   AORTIC VALVE  LVOT Vmax:   64.70 cm/s  LVOT Vmean:  41.200 cm/s  LVOT VTI:    0.086 m     AORTA  Ao Root diam: 3.20 cm   MITRAL VALVE               TRICUSPID VALVE  MV Area (PHT): 7.44 cm    TR Peak grad:   23.4 mmHg  MV Decel Time: 102 msec    TR Vmax:        242.00 cm/s  MV E velocity: 96.10 cm/s  MV A velocity: 49.20 cm/s  SHUNTS  MV E/A ratio:  1.95        Systemic VTI:  0.09 m                             Systemic Diam: 2.50 cm   Oswaldo Milian MD  Electronically signed by Oswaldo Milian MD  Signature Date/Time: 04/30/2021/3:45:52 PM         Final     Physicians  Panel Physicians Referring Physician Case Authorizing Physician  Bensimhon, Shaune Pascal, MD (Primary)     Procedures  RIGHT HEART CATH   Conclusion  Findings:   RA = 6 RV = 29/3 PA = 35/12 (22) PCW = 16 Fick cardiac output/index = 5.5/2.3 PVR = 1.1 WU Ao sat = 98% PA sat = 65%, 67%   Assessment: Well-compensated hemodynamics   Plan/Discussion:   Continue current therapy.    Glori Bickers, MD  1:59 PM    Narrative & Impression  CLINICAL DATA:  Respiratory failure   EXAM: CT CHEST WITHOUT CONTRAST   TECHNIQUE: Multidetector CT imaging of the chest was performed following the standard protocol without IV contrast.   COMPARISON:  05/06/2021, 12/28/2014   FINDINGS: Cardiovascular: Unenhanced imaging of the heart demonstrates mild cardiomegaly without pericardial effusion. Normal caliber of the thoracic aorta. Evaluation of the vascular lumen is limited without intravenous contrast. Left internal jugular catheter identified tip within the  superior vena cava.   Mediastinum/Nodes: Endotracheal tube identified, tip well above carina. Enteric catheter extends into the gastric lumen, tip within the gastric antrum. No pathologic adenopathy.   Lungs/Pleura: Bilateral dependent lower lobe consolidation most consistent with atelectasis. No effusion or pneumothorax. Central airways are patent.   Upper Abdomen: No acute abnormality.   Musculoskeletal: No acute or destructive bony lesions. Single lead defibrillator within the left lateral chest wall, lead anterior to the sternum. Reconstructed images demonstrate no additional findings.   IMPRESSION: 1. Cardiomegaly. 2. Dependent lower lobe atelectasis. No airspace disease or effusion. 3. Support devices as above.  Electronically Signed   By: Randa Ngo M.D.   On: 05/07/2021 15:51     Narrative & Impression  CLINICAL DATA:  Preoperative, scheduled for LVAD.   EXAM: CT ABDOMEN AND PELVIS WITHOUT CONTRAST   TECHNIQUE: Multidetector CT imaging of the abdomen and pelvis was performed following the standard protocol without IV contrast.   COMPARISON:  None.   FINDINGS: Lower chest: Left chest wall battery pack with lead coursing towards the anterior midline chest and above the margins of.   Hepatobiliary: No visible focal liver lesion. Smooth surface contour. Normal gallbladder and biliary tree.   Pancreas: No pancreatic ductal dilatation or surrounding inflammatory changes.   Spleen: Normal in size. No concerning splenic lesions. Small accessory splenule towards the splenic hilum.   Adrenals/Urinary Tract: Normal adrenal glands. Surgical absence of the right kidney. Very mild left perinephric stranding is similar to comparison, nonspecific. No concerning renal mass. No urolithiasis or hydronephrosis. Urinary bladder is unremarkable for the degree of distention.   Stomach/Bowel: Distal esophagus, stomach and duodenum are unremarkable. Normal duodenal  sweep across the midline abdomen. There are some mid abdominal small bowel loops which are borderline air and fluid distended with fecalized material as well. Appearance could suggest some mild ileus or slowed transit with high-grade obstruction less favored. No colonic dilatation or wall thickening.   Vascular/Lymphatic: No significant vascular findings are present. No enlarged abdominal or pelvic lymph nodes.   Reproductive: Few typically benign punctate prostate calcifications. Prostate seminal vesicles are otherwise unremarkable.   Other: No abdominopelvic free fluid or free gas. No bowel containing hernias. Mild soft tissue edema in the flanks and lateral abdominal wall.   Musculoskeletal: Multilevel degenerative changes are present in the imaged portions of the spine. Features most pronounced in the lower thoracic spine. No acute osseous abnormality or suspicious osseous lesion. Musculature is normal and symmetric   IMPRESSION: Few borderline distended loops of small bowel seen in the anterior midline abdomen, could reflect mild ileus or slowed intestinal transit with high-grade obstruction significantly less favored.   Prior right nephrectomy.   Battery pack along the low left chest wall with leads pacing anteriorly to the midline and above the margins of imaging.     Electronically Signed   By: Lovena Le M.D.   On: 05/12/2021 21:27      Impression:  This 45 year old gentleman has nonischemic cardiomyopathy with a left ventricular ejection fraction of less than 20%.  He had a recent admission in July for acute on chronic systolic congestive heart failure with cardiogenic shock probably related to atrial fibrillation with rapid ventricular response.  He required mechanical ventilation for acute respiratory failure and also developed acute renal failure with a rise in his creatinine to 4.65 from his normal of 1.5 probably related to shock and diuresis.  His creatinine  improved to 1.56.  He has recovered and went to CIR and is now at home with outpatient physical therapy.  I think he is at the point where left ventricular assist device therapy is the only option for trying to improve his condition.  He is a higher risk patient due to morbid obesity, stage III chronic kidney disease with 1 remaining kidney, moderate RV dysfunction, some malnutrition and deconditioning due to his recent hospitalization, and diabetes.  I discussed LVAD therapy with him including alternatives, benefits, and risks and he seems to understand and is motivated to get better.  He still smokes some cigarettes and marijuana and I told him that he  needed to discontinue that.  I think that if we were going to offer LVAD therapy he should be done in the near future before he has another decompensation and his renal function worsens to the point that he is not a candidate.   Plan:  He will be discussed at medical review board and decision made about his candidacy for LVAD therapy.  I spent 60 minutes performing this consultation and > 50% of this time was spent face to face counseling and coordinating the care of this patient's nonischemic cardiomyopathy with chronic systolic congestive heart failure and ejection fraction of less than 20%.  Gaye Pollack, MD [facility] [phone]

## 2021-06-16 NOTE — Patient Instructions (Signed)
Cystoscopy Cystoscopy is a procedure that is used to help diagnose and sometimes treat conditions that affect the lower urinary tract. The lower urinary tract includes the bladder and the urethra. The urethra is the tube that drains urine from the bladder. Cystoscopy is done using a thin, tube-shaped instrument with a light and camera at the end (cystoscope). The cystoscope may be hard or flexible, depending on the goal of the procedure. The cystoscope is inserted through the urethra, into the bladder. Cystoscopy may be recommended if you have: Urinary tract infections that keep coming back. Blood in the urine (hematuria). An inability to control when you urinate (urinary incontinence) or an overactive bladder. Unusual cells found in a urine sample. A blockage in the urethra, such as a urinary stone. Painful urination. An abnormality in the bladder found during an intravenous pyelogram (IVP) or CT scan. Cystoscopy may also be done to remove a sample of tissue to be examined under a microscope (biopsy). Tell a health care provider about: Any allergies you have. All medicines you are taking, including vitamins, herbs, eye drops, creams, and over-the-counter medicines. Any problems you or family members have had with anesthetic medicines. Any blood disorders you have. Any surgeries you have had. Any medical conditions you have. Whether you are pregnant or may be pregnant. What are the risks? Generally, this is a safe procedure. However, problems may occur, including: Infection. Bleeding. Allergic reactions to medicines. Damage to other structures or organs. What happens before the procedure? Medicines Ask your health care provider about: Changing or stopping your regular medicines. This is especially important if you are taking diabetes medicines or blood thinners. Taking medicines such as aspirin and ibuprofen. These medicines can thin your blood. Do not take these medicines unless your  health care provider tells you to take them. Taking over-the-counter medicines, vitamins, herbs, and supplements. Tests You may have an exam or testing, such as: X-rays of the bladder, urethra, or kidneys. CT scan of the abdomen or pelvis. Urine tests to check for signs of infection. General instructions Follow instructions from your health care provider about eating or drinking restrictions. Ask your health care provider what steps will be taken to help prevent infection. These steps may include: Washing skin with a germ-killing soap. Taking antibiotic medicine. Plan to have a responsible adult take you home from the hospital or clinic. What happens during the procedure?  You will be given one or more of the following: A medicine to help you relax (sedative). A medicine to numb the area (local anesthetic). The area around the opening of your urethra will be cleaned. The cystoscope will be passed through your urethra into your bladder. Germ-free (sterile) fluid will flow through the cystoscope to fill your bladder. The fluid will stretch your bladder so that your health care provider can clearly examine your bladder walls. Your doctor will look at the urethra and bladder. Your doctor may take a biopsy or remove stones. The cystoscope will be removed, and your bladder will be emptied. The procedure may vary among health care providers and hospitals. What can I expect after the procedure? After the procedure, it is common to have: Some soreness or pain in your abdomen and urethra. Urinary symptoms. These include: Mild pain or burning when you urinate. Pain should stop within a few minutes after you urinate. This may last for up to 1 week. A small amount of blood in your urine for several days. Feeling like you need to urinate but producing only   a small amount of urine. Follow these instructions at home: Medicines Take over-the-counter and prescription medicines only as told by your  health care provider. If you were prescribed an antibiotic medicine, take it as told by your health care provider. Do not stop taking the antibiotic even if you start to feel better. General instructions Return to your normal activities as told by your health care provider. Ask your health care provider what activities are safe for you. If you were given a sedative during the procedure, it can affect you for several hours. Do not drive or operate machinery until your health care provider says that it is safe. Watch for any blood in your urine. If the amount of blood in your urine increases, call your health care provider. Follow instructions from your health care provider about eating or drinking restrictions. If a tissue sample was removed for testing (biopsy) during your procedure, it is up to you to get your test results. Ask your health care provider, or the department that is doing the test, when your results will be ready. Drink enough fluid to keep your urine pale yellow. Keep all follow-up visits. This is important. Contact a health care provider if: You have pain that gets worse or does not get better with medicine, especially pain when you urinate. You have trouble urinating. You have more blood in your urine. Get help right away if: You have blood clots in your urine. You have abdominal pain. You have a fever or chills. You are unable to urinate. Summary Cystoscopy is a procedure that is used to help diagnose and sometimes treat conditions that affect the lower urinary tract. Cystoscopy is done using a thin, tube-shaped instrument with a light and camera at the end. After the procedure, it is common to have some soreness or pain in your abdomen and urethra. Watch for any blood in your urine. If the amount of blood in your urine increases, call your health care provider. If you were prescribed an antibiotic medicine, take it as told by your health care provider. Do not stop taking  the antibiotic even if you start to feel better. This information is not intended to replace advice given to you by your health care provider. Make sure you discuss any questions you have with your health care provider. Document Revised: 05/03/2020 Document Reviewed: 05/03/2020 Elsevier Patient Education  [place].

## 2021-06-17 LAB — URINALYSIS, COMPLETE
Bilirubin, UA: NEGATIVE
Leukocytes,UA: NEGATIVE
Nitrite, UA: NEGATIVE
Specific Gravity, UA: 1.02 (ref 1.005–1.030)
Urobilinogen, Ur: 1 mg/dL (ref 0.2–1.0)
pH, UA: 6 (ref 5.0–7.5)

## 2021-06-17 LAB — MICROSCOPIC EXAMINATION
Bacteria, UA: NONE SEEN
RBC, Urine: >30 /hpf — AB (ref 0–2)

## 2021-06-18 ENCOUNTER — Other Ambulatory Visit: Payer: Self-pay

## 2021-06-19 ENCOUNTER — Other Ambulatory Visit: Payer: Self-pay

## 2021-06-19 ENCOUNTER — Ambulatory Visit (HOSPITAL_COMMUNITY)
Admission: RE | Admit: 2021-06-19 | Discharge: 2021-06-19 | Disposition: A | Discharge status: Home / Self Care | Payer: Medicaid Other | Source: Ambulatory Visit | Attending: Cardiology | Admitting: Cardiology

## 2021-06-19 DIAGNOSIS — I5022 Chronic systolic (congestive) heart failure: Secondary | ICD-10-CM | POA: Insufficient documentation

## 2021-06-19 LAB — BASIC METABOLIC PANEL
Anion gap: 8 (ref 5–15)
BUN: 25 mg/dL — ABNORMAL HIGH (ref 6–20)
CO2: 22 mmol/L (ref 22–32)
Calcium: 10 mg/dL (ref 8.9–10.3)
Chloride: 107 mmol/L (ref 98–111)
Creatinine, Ser: 1.83 mg/dL — ABNORMAL HIGH (ref 0.61–1.24)
GFR, Estimated: 46 mL/min — ABNORMAL LOW (ref >60)
Glucose, Bld: 99 mg/dL (ref 70–99)
Potassium: 4.2 mmol/L (ref 3.5–5.1)
Sodium: 137 mmol/L (ref 135–145)

## 2021-06-19 LAB — CULTURE, URINE COMPREHENSIVE

## 2021-06-23 ENCOUNTER — Other Ambulatory Visit: Payer: Self-pay | Admitting: Physician Assistant

## 2021-06-23 ENCOUNTER — Other Ambulatory Visit (HOSPITAL_COMMUNITY): Payer: Self-pay

## 2021-06-23 ENCOUNTER — Encounter: Payer: Medicaid Other | Admitting: Physical Medicine and Rehabilitation

## 2021-06-24 ENCOUNTER — Other Ambulatory Visit (HOSPITAL_COMMUNITY): Payer: Self-pay | Admitting: *Deleted

## 2021-06-24 ENCOUNTER — Encounter (HOSPITAL_COMMUNITY): Payer: Self-pay | Admitting: *Deleted

## 2021-06-24 ENCOUNTER — Other Ambulatory Visit (HOSPITAL_COMMUNITY): Payer: Self-pay

## 2021-06-24 DIAGNOSIS — I5022 Chronic systolic (congestive) heart failure: Secondary | ICD-10-CM

## 2021-06-24 NOTE — Progress Notes (Addendum)
Called and spoke with patient about the below plan:   - Stop Eliquis on 07/15/21 - Pre-procedure COVID test- no appt needed. Go on Wednesday 07/16/21 to [facility] [place], [place] [address] for COVID testing and quarantine after until [facility] on Friday - Right heart cath scheduled 07/18/21 at 0730. Pt needs to arrive in admitting at 0530. Nothing to eat or drink after midnight - Will be admitted following RHC for optimization over the weekend - LVAD implant scheduled 07/21/21 0730 with Dr Cyndia Bent  All questions answered. Provided patient with VAD office phone number [phone] to call if he has any questions. He verbalized understanding of all the above. Letter sent to pt with the above information.   [name] RN [place]ine Coordinator  Office: [phone]  24/7 Pager: [phone]

## 2021-06-25 ENCOUNTER — Other Ambulatory Visit: Payer: Self-pay | Admitting: Obstetrics and Gynecology

## 2021-06-25 ENCOUNTER — Other Ambulatory Visit: Payer: Self-pay

## 2021-06-25 NOTE — Patient Outreach (Signed)
Medicaid Managed Care   Nurse Care Manager Note  06/25/2021 Name:  Jeffrey [name] MRN:  [id number] DOB:  Apr 09, 1976  Jeffrey [name] is an 45 y.o. year old male who is a primary patient of [place], Jake Church I, NP.  The [facility] Managed Care Coordination team was consulted for assistance with:    Chronic healthcare management needs.  Jeffrey [name] was given information about Medicaid Managed Care Coordination team services today. Jeffrey [name] Patient agreed to services and verbal consent obtained.  Engaged with patient by telephone for follow up visit in response to provider referral for case management and/or care coordination services.   Assessments/Interventions:  Review of past medical history, allergies, medications, health status, including review of consultants reports, laboratory and other test data, was performed as part of comprehensive evaluation and provision of chronic care management services.  SDOH (Social Determinants of Health) assessments and interventions performed: SDOH Interventions    Flowsheet Row Most Recent Value  SDOH Interventions   Intimate Partner Violence Interventions Intervention Not Indicated  Physical Activity Interventions Other (Comments)  [patient walks with walker up and down is driveway each day for 15 minutes]       Care Plan  Allergies  Allergen Reactions   Aspirin Shortness Of Breath, Itching and Rash     Burning sensation (Patient reports he tolerates other NSAIDS)    Bee Venom Hives and Swelling   Lisinopril Cough   Shrimp (Diagnostic) Rash   Tomato Rash    Medications Reviewed Today     Reviewed by Gayla Medicus, RN (Registered Nurse) on 06/25/21 at [place] List Status: <None>   Medication Order Taking? Sig Documenting Provider Last Dose Status Informant  acetaminophen (TYLENOL) 325 MG tablet [id number] No Take 2 tablets (650 mg total) by mouth every 6 (six) hours as needed for fever. Darrick Grinder D, NP Taking Active   allopurinol (ZYLOPRIM)  100 MG tablet [id number] No Take 1 tablet (100 mg total) by mouth daily. Cathlyn Parsons, PA-C Taking Active   ALPRAZolam Duanne Moron) 0.5 MG tablet [id number] No Take 1 tablet (0.5 mg total) by mouth 2 (two) times daily. Cathlyn Parsons, PA-C Taking Active   amiodarone (PACERONE) 200 MG tablet [id number] No Take 0.5 tablets (100 mg total) by mouth daily. [place], Maricela Bo, FNP Taking Active   apixaban (ELIQUIS) 5 MG TABS tablet [id number] No Take 1 tablet (5 mg total) by mouth 2 (two) times daily. Cathlyn Parsons, PA-C Taking Active   Discontinued 11/26/20 1333 (Stop Taking at Discharge) diclofenac Sodium (VOLTAREN) 1 % GEL [id number] No Apply 2 g topically 4 (four) times daily. Izora Ribas, MD Taking Active   docusate sodium (COLACE) 100 MG capsule [id number] No Take 1 capsule (100 mg total) by mouth 2 (two) times daily. Clegg, Amy D, NP Taking Active   empagliflozin (JARDIANCE) 10 MG TABS tablet [id number] No TAKE 1 TABLET (10 MG TOTAL) BY MOUTH DAILY BEFORE BREAKFAST. Angiulli, Lavon Paganini, PA-C Taking Active   insulin aspart protamine - aspart (NOVOLOG 70/30 MIX) (70-30) 100 UNIT/ML FlexPen [id number] No Inject 38 Units into the skin 2 (two) times daily. [place], Maricela Bo, FNP Taking Active   Insulin Syringe-Needle U-100 (TRUEPLUS INSULIN SYRINGE) 31G X 5/16" 0.5 ML MISC [id number] No USE TO INJECT INSULIN TWICE DAILY. Bo Merino I, NP Taking Active   losartan (COZAAR) 25 MG tablet [id number] No Take 0.5 tablets (12.5 mg total) by mouth daily. [place], Maricela Bo, FNP Taking Active   metFORMIN (  GLUCOPHAGE) 1000 MG tablet [id number] No Take 1 tablet (1,000 mg total) by mouth 2 (two) times daily with a meal. [place], Maricela Bo, FNP Taking Active   nitroGLYCERIN (NITROSTAT) 0.4 MG SL tablet [id number] No Place 1 tablet (0.4 mg total) under the tongue every 5 (five) minutes x 3 doses as needed for chest pain. Bensimhon, Shaune Pascal, MD Taking Active   pantoprazole (PROTONIX) 40 MG tablet [id number] No  Take 1 tablet (40 mg total) by mouth daily. Cathlyn Parsons, PA-C Taking Active   polyethylene glycol (MIRALAX / GLYCOLAX) 17 g packet [id number] No Take 17 g by mouth daily. Cathlyn Parsons, PA-C Taking Active   sertraline (ZOLOFT) 25 MG tablet [id number] No Take 1 tablet (25 mg total) by mouth daily. Cathlyn Parsons, PA-C Taking Active   spironolactone (ALDACTONE) 25 MG tablet [id number] No Take 1 tablet (25 mg total) by mouth daily. Cathlyn Parsons, PA-C Taking Active   torsemide (DEMADEX) 20 MG tablet [id number] No Take 1 tablet (20 mg total) by mouth daily as needed (If wt gain > 3 lb in 24 hr or > 5 lb in 1 week). Izora Ribas, MD Taking Active   traMADol (ULTRAM) 50 MG tablet [id number] No Take 2 tablets (100 mg total) by mouth every 12 (twelve) hours as needed for moderate pain. Cathlyn Parsons, PA-C Taking Active   traZODone (DESYREL) 100 MG tablet [id number] No Take 1 tablet (100 mg total) by mouth at bedtime. Cathlyn Parsons, PA-C Taking Active             Patient Active Problem List   Diagnosis Date Noted   Cardiogenic shock [facility])    Atrial fibrillation with RVR ([place]) 04/30/2021   Acute hypoxemic respiratory failure (HCC)    Right wrist pain 04/16/2021   Absolute anemia    Rectal bleeding    Iron deficiency 12/25/2020   Long term current use of anticoagulant - apixaban 12/25/2020   Paroxysmal atrial fibrillation (HCC)    Lactic acidosis 11/22/2020   Hypotension 11/22/2020   Aspiration into airway    ICD (implantable cardioverter-defibrillator) battery depletion 05/15/2020   Endotracheal tube present    Respiratory failure ([place])    Acute encephalopathy    Acute pulmonary edema (HCC)    NICM (nonischemic cardiomyopathy) ([place]) 04/24/2020   S/P vasectomy 12/20/2019   Anxiety 12/20/2019   Hemoglobin A1C between 7% and 9% indicating borderline diabetic control 12/20/2019   Seasonal allergies 12/20/2019   Insomnia 12/20/2019   Vasectomy evaluation  06/20/2019   Visual problems 06/20/2019   Blurry vision, bilateral 06/20/2019   Hyperglycemia 02/13/2019   Hyperosmolar (nonketotic) coma ([place]) 01/19/2019   AICD (automatic cardioverter/defibrillator) present 01/19/2019   Hyperlipidemia 12/07/2018   Follow-up exam 11/22/2018   Class 3 severe obesity due to excess calories with serious comorbidity and body mass index (BMI) of 45.0 to 49.9 in adult ([place]) 11/22/2018   Dizziness 11/22/2018   Lingular pneumonia 08/04/2018   Ventricular tachycardia ([place]) 12/30/2017   PAF (paroxysmal atrial fibrillation) ([place])    Dilated cardiomyopathy ([place])    Pollen allergy 02/17/2017   Dyslipidemia 02/17/2017   Hematochezia 11/08/2015   CKD (chronic kidney disease), stage III ([place]) 06/20/2015   Cardiac defibrillator in situ    Chronic systolic CHF (congestive heart failure) ([place]) 03/11/2015   Gouty arthritis 03/11/2015   Chest pain 12/28/2014   Acute renal failure superimposed on stage 3 chronic kidney disease ([place]) 04/08/2014   Aspirin allergy 04/08/2014   Renal cell carcinoma (  [place])    Gout attack 11/10/2011   Morbid obesity ([place]) 10/23/2011   OSA (obstructive sleep apnea) 10/23/2011   Type 2 diabetes mellitus with stage 3 chronic kidney disease, with long-term current use of insulin ([place]) 10/22/2011   Hyperlipidemia associated with type 2 diabetes mellitus ([place]) 12/25/2010   Hypertension associated with diabetes ([place]) 12/25/2010    Conditions to be addressed/monitored per PCP order:   chronic healthcare management needs,  CHF,Afib, HTN, CKD,OSA, Gout, DM2, h/o remal cell carcinoma, HLD  Care Plan : General Plan of Care (Adult)  Updates made by Gayla Medicus, RN since 06/25/2021 12:00 AM     Problem: Health Promotion or Disease Self-Management (General Plan of Care)   Priority: High  Onset Date: 09/24/2020  Note:   Current Barriers:  Chronic Disease Management support and education needs, CHF, HTN, CKD, OSA, gout, DM2, h/o renal cell  carcinoma. Today, 04/28/21, having right hand and wist pain-neuro referral placed by provider-resolved per patient Update 06/25/21:  patient in need of childcare expense resources-? Voucher.  Has applied for food stamps and disability.  Nurse Case Manager Clinical Goal(s):  Over the next 90 days, patient will attend all scheduled medical appointments:   Interventions:  Inter-disciplinary care team collaboration (see longitudinal plan of care) Collaborated with BSW regarding food stamps, disability updates, and childcare resources for voucher. BSW referral for resources related to childcare expense  Evaluation of current treatment plan and patient's adherence to plan as established by provider.  Update 11/28/20:  Continue monitoring BP daily and reporting readings outside of parameters. Update 12/27/20:  Blood pressure 128/80. Update 04/28/21:  Patient not currently checking blood pressure. Update 06/25/21:  patient states he is checking blood pressure every day now-120/85 today. Reviewed medications with patient. Discussed plans with patient for ongoing care management follow up and provided patient with direct contact information for care management team Provided patient with educational materials related to exercise. Reviewed scheduled/upcoming provider appointments. Update 11/28/20:  Discussed low glycemic vs high glycemic foods.  Pharmacy referral made for review of medications. Pharmacy referral for medication review. Update 01/22/21:  Pharmacy review completed 01/13/21, follow up 05/15/21. Update 06/25/21:  Cystoscopy scheduled for 07/10/21 and LVAD 07/21/21.  Patient Goals/Self-Care Activities Over the next 90 days, patient will:  -Attends all scheduled provider appointments Calls provider office for new concerns or questions  Follow Up Plan: The Managed Medicaid care management team will reach out to the patient again over the next 30 days.  The patient has been provided with contact  information for the Managed Medicaid care management team and has been advised to call with any health related questions or concerns.     Follow Up:  Patient agrees to Care Plan and Follow-up.  Plan: The Managed Medicaid care management team will reach out to the patient again over the next 30 days. and The patient has been provided with contact information for the Managed Medicaid care management team and has been advised to call with any health related questions or concerns.  Date/time of next scheduled RN care management/care coordination outreach:  07/25/21 at 1030.

## 2021-06-25 NOTE — Patient Instructions (Signed)
Hi Jeffrey [name], thank you for speaking with me today, have a nice afternoon.    Jeffrey [name] was given information about Medicaid Managed Care team care coordination services as a part of their Healthy [facility] Medicaid benefit. Jeffrey [name] verbally consented to engagement with the [facility] Managed Care team.   If you are experiencing a medical emergency, please call 911 or report to your local emergency department or urgent care.   If you have a non-emergency medical problem during routine business hours, please contact your provider's office and ask to speak with a nurse.   For questions related to your Healthy [facility] health plan, please call: [phone] or visit the homepage here: [email or web address]  If you would like to schedule transportation through your Healthy [facility] plan, please call the following number at least 2 days in advance of your appointment: [phone]  Call the [place] at [phone], at any time, 24 hours a day, 7 days a week. If you are in danger or need immediate medical attention call 911.  If you would like help to quit smoking, call 1-800-QUIT-NOW 916-083-4944) OR Espaol: 1-855-Djelo-Ya (9-379-024-0973) o para ms informacin haga clic aqu or Text READY to 200-400 to register via text  Jeffrey [name] - following are the goals we discussed in your visit today:   Goals Addressed             This Visit's Progress    Protect My Health       Timeframe:  Long-Range Goal Priority:  High Start Date:    09/24/20                         Expected End Date:  ongoing            - schedule recommended health tests  - schedule and keep appointment for annual check-up  Update 11/28/20:Patient recently discharged from hospital 11/26/20-follow up appointments scheduled. Update 12/27/20:  Patient with no complaints today-scheduled for colonoscopy 02/05/21-rectal bleeding. Update 01/22/21:  Patient attending all  scheduled appointments.  Annual check up completed 12/31/20 with Ms. Clovis Riley, NP. Update 04/28/21:  Patient has upcoming appointments scheduled with Neurology and Cardiology. Update 06/25/21:  Cysto scheduled for 07/10/21 and LVAD 07/21/21.     The patient verbalized understanding of instructions provided today and declined a print copy of patient instruction materials.   The Managed Medicaid care management team will reach out to the patient again over the next 30 days.  The  Patient  has been provided with contact information for the Managed Medicaid care management team and has been advised to call with any health related questions or concerns.   [name] RN, BSN [place]  [facility] - Managed Medicaid High Risk [phone].   Following is a copy of your plan of care:  Care Plan : General Plan of Care (Adult)  Updates made by Jeffrey Medicus, RN since 06/25/2021 12:00 AM     Problem: Health Promotion or Disease Self-Management (General Plan of Care)   Priority: High  Onset Date: 09/24/2020  Note:   Current Barriers:  Chronic Disease Management support and education needs, CHF, HTN, CKD, OSA, gout, DM2, h/o renal cell carcinoma. Today, 04/28/21, having right hand and wist pain-neuro referral placed by provider-resolved per patient Update 06/25/21:  patient in need of childcare expense resources-? Voucher.  Has applied for food stamps and disability.  Nurse Case Manager Clinical Goal(s):  Over the next 90 days, patient will attend all scheduled medical appointments:   Interventions:  Inter-disciplinary care team collaboration (see longitudinal plan of care) Collaborated with BSW regarding food stamps, disability updates, and childcare resources for voucher. BSW referral for resources related to childcare expense  Evaluation of current treatment plan and patient's adherence to plan as established by provider.  Update 11/28/20:  Continue  monitoring BP daily and reporting readings outside of parameters. Update 12/27/20:  Blood pressure 128/80. Update 04/28/21:  Patient not currently checking blood pressure. Update 06/25/21:  patient states he is checking blood pressure every day now-120/85 today. Reviewed medications with patient. Discussed plans with patient for ongoing care management follow up and provided patient with direct contact information for care management team Provided patient with educational materials related to exercise. Reviewed scheduled/upcoming provider appointments. Update 11/28/20:  Discussed low glycemic vs high glycemic foods.  Pharmacy referral made for review of medications. Pharmacy referral for medication review. Update 01/22/21:  Pharmacy review completed 01/13/21, follow up 05/15/21. Update 06/25/21:  Cystoscopy scheduled for 07/10/21 and LVAD 07/21/21.  Patient Goals/Self-Care Activities Over the next 90 days, patient will:  -Attends all scheduled provider appointments Calls provider office for new concerns or questions  Follow Up Plan: The Managed Medicaid care management team will reach out to the patient again over the next 30 days.  The patient has been provided with contact information for the Managed Medicaid care management team and has been advised to call with any health related questions or concerns.

## 2021-06-26 ENCOUNTER — Other Ambulatory Visit: Payer: Self-pay

## 2021-06-26 ENCOUNTER — Ambulatory Visit (HOSPITAL_COMMUNITY)
Admission: RE | Admit: 2021-06-26 | Discharge: 2021-06-26 | Disposition: A | Discharge status: Home / Self Care | Payer: Medicaid Other | Source: Ambulatory Visit | Attending: Cardiology | Admitting: Cardiology

## 2021-06-26 VITALS — BP 110/84 | HR 99 | Wt 299.2 lb

## 2021-06-26 DIAGNOSIS — E1122 Type 2 diabetes mellitus with diabetic chronic kidney disease: Secondary | ICD-10-CM | POA: Diagnosis not present

## 2021-06-26 DIAGNOSIS — G4733 Obstructive sleep apnea (adult) (pediatric): Secondary | ICD-10-CM | POA: Insufficient documentation

## 2021-06-26 DIAGNOSIS — I5022 Chronic systolic (congestive) heart failure: Secondary | ICD-10-CM | POA: Insufficient documentation

## 2021-06-26 DIAGNOSIS — Z79899 Other long term (current) drug therapy: Secondary | ICD-10-CM | POA: Insufficient documentation

## 2021-06-26 DIAGNOSIS — M109 Gout, unspecified: Secondary | ICD-10-CM | POA: Insufficient documentation

## 2021-06-26 DIAGNOSIS — I130 Hypertensive heart and chronic kidney disease with heart failure and stage 1 through stage 4 chronic kidney disease, or unspecified chronic kidney disease: Secondary | ICD-10-CM | POA: Insufficient documentation

## 2021-06-26 DIAGNOSIS — I480 Paroxysmal atrial fibrillation: Secondary | ICD-10-CM | POA: Diagnosis not present

## 2021-06-26 DIAGNOSIS — Z7901 Long term (current) use of anticoagulants: Secondary | ICD-10-CM | POA: Insufficient documentation

## 2021-06-26 DIAGNOSIS — Z7984 Long term (current) use of oral hypoglycemic drugs: Secondary | ICD-10-CM | POA: Insufficient documentation

## 2021-06-26 DIAGNOSIS — I428 Other cardiomyopathies: Secondary | ICD-10-CM | POA: Insufficient documentation

## 2021-06-26 DIAGNOSIS — N183 Chronic kidney disease, stage 3 unspecified: Secondary | ICD-10-CM | POA: Insufficient documentation

## 2021-06-26 LAB — BASIC METABOLIC PANEL
Anion gap: 9 (ref 5–15)
BUN: 27 mg/dL — ABNORMAL HIGH (ref 6–20)
CO2: 21 mmol/L — ABNORMAL LOW (ref 22–32)
Calcium: 9.9 mg/dL (ref 8.9–10.3)
Chloride: 108 mmol/L (ref 98–111)
Creatinine, Ser: 1.91 mg/dL — ABNORMAL HIGH (ref 0.61–1.24)
GFR, Estimated: 43 mL/min — ABNORMAL LOW (ref >60)
Glucose, Bld: 127 mg/dL — ABNORMAL HIGH (ref 70–99)
Potassium: 4.4 mmol/L (ref 3.5–5.1)
Sodium: 138 mmol/L (ref 135–145)

## 2021-06-26 LAB — BRAIN NATRIURETIC PEPTIDE: B Natriuretic Peptide: 928.9 pg/mL — ABNORMAL HIGH (ref 0.0–100.0)

## 2021-06-26 MED ORDER — SERTRALINE HCL 25 MG PO TABS
25.0000 mg | ORAL_TABLET | Freq: Every day | ORAL | 3 refills | Status: DC
  Filled 2021-06-26: qty 30, 30d supply, fill #0
  Filled 2021-08-14: qty 30, 30d supply, fill #1
  Filled 2021-09-02: qty 30, 30d supply, fill #2

## 2021-06-26 MED ORDER — APIXABAN 5 MG PO TABS
5.0000 mg | ORAL_TABLET | Freq: Two times a day (BID) | ORAL | 3 refills | Status: DC
  Filled 2021-06-26: qty 60, 30d supply, fill #0

## 2021-06-26 MED ORDER — PREDNISONE 10 MG PO TABS
40.0000 mg | ORAL_TABLET | Freq: Every day | ORAL | 0 refills | Status: AC
  Filled 2021-06-26: qty 12, 3d supply, fill #0

## 2021-06-26 MED ORDER — AMIODARONE HCL 200 MG PO TABS
100.0000 mg | ORAL_TABLET | Freq: Every day | ORAL | 3 refills | Status: DC
  Filled 2021-06-26: qty 15, 30d supply, fill #0

## 2021-06-26 NOTE — Progress Notes (Signed)
Heart and Vascular Care Navigation  06/26/2021  Jeffrey [name] 01-Jun-1976 [id number]  Reason for Referral: CSW met with patient to follow up on VAD evaluate and complete PHQ 9.   Engaged with patient face to face for follow up visit for Heart and Vascular Care Coordination.                                                                                                   Assessment:  Patient is a 45 yo married male who is known to the VAD team and scheduled for VAD implantation in the coming weeks. Patient reports he is ready and shared the upcoming plan. He states he is struggling with gout today and has some new medication to relieve the pain.  Patient asked about possible resources to cover day care expenses as his youngest child goes to work at the day care where his wife works but they only get a Water quality scientist. This month's bill was over $800.                              HRT/VAS Care Coordination     Patients Home Cardiology Office Heart Failure Clinic   Outpatient Care Team Social Worker   Social Worker Name: Raquel Sarna, [place] [phone]   Living arrangements for the past 2 months Single Family Home   Lives with: Minor Children; Spouse   Patient Current Insurance Coverage Medicaid   Patient Has Concern With Paying Medical Bills No   Does Patient Have Prescription Coverage? Yes   Home Assistive Devices/Equipment Shower chair with back; CBG Meter; CPAP; Blood pressure cuff; Walker (specify type)   DME Agency [employer]   [place] Agency NA       Social History:                                                                             SDOH Screenings   Alcohol Screen: Low Risk    Last Alcohol Screening Score (AUDIT): 0  Depression (PHQ2-9): Medium Risk   PHQ-2 Score: 8  Financial Resource Strain: High Risk   Difficulty of Paying Living Expenses: Very hard  Food Insecurity: Landscape architect Present   Worried About Charity fundraiser in the Last Year: Sometimes true   Ran  Out of Food in the Last Year: Sometimes true  Housing: Low Risk    Last Housing Risk Score: 0  Physical Activity: Inactive   Days of Exercise per Week: 0 days   Minutes of Exercise per Session: 0 min  Social Connections: Not on file  Stress: Not on file  Tobacco Use: Medium Risk   Smoking Tobacco Use: Former   Smokeless Tobacco Use: Never  Transportation Needs: Unmet Transportation Needs   Lack of  Transportation (Medical): Yes   Lack of Transportation (Non-Medical): Yes    SDOH Interventions: Financial Resources:  Financial Strain Interventions: Other (Comment) CSW referred patient and wife to DSS for day care assistance  Food Insecurity:   N/a  Housing Insecurity:   Na/  Transportation:   Transportation Interventions: Financial planner    Other Care Navigation Interventions:     Inpatient/Outpatient Substance Abuse Counseling/Rehab Options N/a  Provided Pharmacy assistance resources  N/a  Patient expressed Mental Health concerns Yes, Referred to:  Will refer to [place] post LVAD surgery.  Patient Referred to: N/a   Follow-up plan:  CSW provided supportive intervention around LVAD implantation and preparation for surgery. Patient scored a 8 ob the PHQ9 and agreed to referral to [facility] post VAD surgery. Patient shared financial struggles with day care expenses as his disability has not yet been approved. CSW referred to DSS for day care assistance. Patient verbalizes understanding of plan and support. Patient is ready for VAD implantation. Raquel Sarna, [place], [place]

## 2021-06-26 NOTE — Progress Notes (Signed)
Primary Care Provider:  [name], FNP (Inactive) Primary Cardiologist:  Dr. [name] Primary HF Cardiologist: Dr. [name]  HPI:  Jeffrey [name] is a 45 y.o. male with a history of poorly controlled HTN, R renal cell carcinoma s/p nephrectomy, CKD IIIa, DM2, OSA, gout, morbid obesity and systolic HF due to NICM.    Echo in 2012 with EF 20-25%. In 10/2011 had nuclear study EF 45-50% suggestive of NICM.  Admitted 12/2014 secondary to atypical chest pain, dyspnea, and mild troponin elevation. Echo with "severely reduced LV function" - EF not quantified due to poor windows. Cath 05/2015 showed normal coronaries and moderately elevated filling pressures with moderately depressed cardiac output. [place] s- ICD implanted.   Admitted 06/2017 with ADHF in setting of new afib. Started on amiodarone. Pt diuresed and converted to NSR without requiring DCCV. Started on Eliquis for anticoagulation.   Admitted 12/2017 with ICD shock. Electrolytes stable. Echo 12/31/17 with LVEF 25-30%.    Pt admitted 05/15/20 for S-ICD generator change. Course was complicated with acute hypoxic/hypercapnic respiratory failure due to possible flash pulmonary edema. Had traumatic intubation resulting in aspiration and requiring ventilator support and short term pressor support.   Hospitalized 2/17-2/22/22 for hypotension  & dizziness. Found to have AKI, likely 2/2 to recent diarrhea, diuretic use and poor po intake. Gently hydrated with IVF, Entresto and torsemide decreased, and BiDil stopped. Discharge weight 323 lbs.   Admitted 0/1007 with A/C systolic CHF/cardiogenic shock and AF with RVR. He was placed on amio gtt and underwent cardioversion. Had recurrent AF and underwent cardioversion again. Echo showed EF < 20%, severe LV dilation, moderate RV dysfunction. Required NE and dobutamine, which were eventually weaned off. Hospitalization c/b by acute respiratory failure with hypoxia requiring mechanical ventilation.  Seen by CCM and hypoxia felt to be due to atelectasis. Diuresed with lasix gtt with weight loss of 30 lb. Torsemide stopped after he developed hypotension and worsening renal function. GDMT was limited by hypotension and VAD workup initiated. Underwent RHC, for VAD work up, which showed well-compensated hemodynamics. Creatinine peaked at 4.65 improved to 1.58 which is his baseline. Also treated for acute gout flare with prednisone and colchicine. Sertraline initiated for management of depression. He was discharged to CIR, weight 299 lbs.   He returned to HF Clinic on 06/03/21 for post hospitalization HF follow up. He was using a walker for balance. Overall feeling fine. No significant dyspnea walking on flat ground. Denies CP, dizziness, edema, or PND/Orthopnea. Appetite ok. No fever or chills. Weight at home 301 pounds. Taking all medications. Working on portion control. Intermittently seeing some light pink colored urine. He has not taken any torsemide. He wears his CPAP nightly.  Today he returns to HF clinic for pharmacist medication titration. At last visit with APP, losartan was stopped and Entresto 24/26 mg BID was started. Patient had [facility] at [facility] on [facility]10[facility]21 and had hypotension (79/60). Provider discontinued Entresto and switched back to losartan 12.5 mg daily. Labs on 06/13/21 showed elevated creatinine (2.08). Patient's wife thought he was still on Entresto, so he had been taking both losartan and Entresto. Delene Loll was then stopped and losartan 12.5 mg daily was restarted. Repeat BMET on 06/19/21 showed SCr improved to 1.83.  Overall, feeling okay. Having a gout flare in left ankle which is causing lots of pain. States home BP never goes higher than 120/80; BP in clinic is 110/84. Denies dizziness or lightheadedness. Occasionally gets fatigued when walking.  Can  walk up and down his driveway x2 before getting SOB. Weight at home stable at 294.5 lbs. Has not needed any torsemide.  Denies LEE, PND, or orthopnea. Knees are swollen which is normal; states it is from "lack of cartilage". Wears CPAP nightly. Appetite is ok. Doing portion control and is limiting fluid intake to ~1.5 liters per day. Patient is taking and tolerating all his medications.  HF Medications: Losartan 12.5 mg daily Spironolactone 25 mg daily Jardiance 10 mg daily Torsemide 20 mg PRN  Has the patient been experiencing any side effects to the medications prescribed? No, patient is tolerating all his medications.  Does the patient have any problems obtaining medications due to transportation or finances? No - Patient has [facility]  Understanding of regimen: good Understanding of indications: good Potential of compliance: good Patient understands to avoid NSAIDs. Patient understands to avoid decongestants.   Pertinent Lab Values: 06/26/21: Serum creatinine 1.91, BUN 27, Potassium 4.4, Sodium 138, BNP 928.9 pg/mL  Vital Signs: Weight: 299.2 lbs (last clinic weight: 305 lbs) Blood pressure: 110/84 Heart rate: 99   Assessment/Plan: 1. Chronic systolic CHF - Nonischemic CM. - Echo (06/2020): EF 20-25%.   - Echo on admission (05/2021): EF < 20%, severe LV dilation, moderate RV dysfunction.   - RHC (05/2021): well compensated hemodynamics. - s/p S-ICD - NYHA III, volume good today. No medication changes today. Plan for LVAD 07/21/21.  - BMET/BNP returned today after clinic visit: SCr 1.91, K 4.4, BNP 928.9. Discussed with Dr. [name], labs stable and no changes made. - Continue torsemide 20 mg PRN - Continue losartan 12.5 mg daily. - Continue spironolactone 25 mg daily - Continue Jardiance 10 mg daily - No BB d/t recent shock. - Will not restart Bidil yet. - VAD work-up started. Implantation scheduled for 07/21/21.    2 Atrial fibrillation: Paroxysmal - Emergent TEE-guided DCCV 04/2016 due to inability to control HR. - Recurrent AF on 05/09/21. s/p DCCV on 05/10/21.  - Seen by EP. Not a  candidate for ablation due to body habitus (could consider AVN ablation and CRT, if needed)  - NSR on ECG on 06/03/21. - Continue amiodarone 100 mg daily. - Continue Eliquis 5 mg BID.    3. CKD stage 3  - Has solitary kidney.   - Baseline creatinine is 1.5-1.7.  - Avoid hypotension.   4. OSA - Continue CPAP.    5. DM2. Poorly controlled - On Jardiance. - Hgb A1c 7.6.   6. Physical Deconditioning - Started PT  7. Gout -Gout flare in L ankle. Discussed with Dr. [name]. Start prednisone 40 mg daily x3 days.   Follow-up 07/15/21 with APP clinic.  Audry Riles, PharmD, BCPS, BCCP, CPP Heart Failure Clinic Pharmacist [phone]

## 2021-06-26 NOTE — Patient Instructions (Addendum)
It was a pleasure seeing you today!  MEDICATIONS: -Take prednisone 40 mg (4 tablets) daily for 3 days for gout.  -Call if you have questions about your medications.  LABS: -We will call you if your labs need attention.  NEXT APPOINTMENT: Return to clinic in 3 weeks with APP Clinic.  In general, to take care of your heart failure: -Limit your fluid intake to 2 Liters (half-gallon) per day.   -Limit your salt intake to ideally 2-3 grams (2000-3000 mg) per day. -Weigh yourself daily and record, and bring that "weight diary" to your next appointment.  (Weight gain of 2-3 pounds in 1 day typically means fluid weight.) -The medications for your heart are to help your heart and help you live longer.   -Please contact us before stopping any of your heart medications.  Call the clinic at [phone] with questions or to reschedule future appointments.

## 2021-07-02 ENCOUNTER — Ambulatory Visit: Payer: Self-pay

## 2021-07-03 ENCOUNTER — Other Ambulatory Visit: Payer: Self-pay

## 2021-07-03 NOTE — Patient Instructions (Signed)
Visit Information  Mr. CONO GEBHARD  - as a part of your Medicaid benefit, you are eligible for care management and care coordination services at no cost or copay. I was unable to reach you by phone today but would be happy to help you with your health related needs. Please feel free to call me @ [phone].   A member of the Managed Medicaid care management team will reach out to you again over the next 7 days.   [name], BSW, [place] Managed Medicaid Team  [phone]

## 2021-07-03 NOTE — Patient Outreach (Signed)
Care Coordination  07/03/2021  Jeffrey [name] Aug 05, 1976 [id number]   Medicaid Managed Care   Unsuccessful Outreach Note  07/03/2021 Name: Jeffrey [name] MRN: [id number] DOB: 15-Aug-1976  Referred by: Teena Dunk, NP Reason for referral : High Risk Managed Medicaid (MM Social Work [employer])   An unsuccessful telephone outreach was attempted today. The patient was referred to the case management team for assistance with care management and care coordination.   Follow Up Plan: The care management team will reach out to the patient again over the next 7 days.   [name], BSW, [place] Managed Medicaid Team  [phone]

## 2021-07-04 ENCOUNTER — Encounter: Payer: Medicaid Other | Admitting: Physical Medicine and Rehabilitation

## 2021-07-10 ENCOUNTER — Other Ambulatory Visit: Payer: Medicaid Other | Admitting: Urology

## 2021-07-11 ENCOUNTER — Telehealth (HOSPITAL_COMMUNITY): Payer: Self-pay | Admitting: *Deleted

## 2021-07-11 ENCOUNTER — Telehealth: Payer: Self-pay

## 2021-07-11 ENCOUNTER — Other Ambulatory Visit (HOSPITAL_COMMUNITY): Payer: Self-pay | Admitting: *Deleted

## 2021-07-11 DIAGNOSIS — I5022 Chronic systolic (congestive) heart failure: Secondary | ICD-10-CM

## 2021-07-11 NOTE — Telephone Encounter (Addendum)
SW [name] RN at Dr. [name] office. Pt canceled cystoscopy and mri on his own terms. As per cardio pt must get these procedures done prior to his LVAD placement that is scheduled on 07/18/2021 and 07/21/2021 Pt states Urology canceled his appointments. I advised her we did not cancel appointments and added pt on schedule for Tuesday for cystoscopy. Patient is scheduled for MRI on Tuesday.

## 2021-07-11 NOTE — Telephone Encounter (Addendum)
Spoke with patient regarding the below appointment schedule:   1. VAD clinic appt 07/14/21 at 0900 2. 07/15/21: MRI abdomen/pelvis at 0800 & 0900 at [facility] Radiology. NPO 4  hours prior. 3. 07/1121: Appt with Dr Diamantina Providence at the [facility] Urology office at 1:30 pm [place]  To arrive at 1:15 pm to complete urine specimen ([address])  Pt verbalized understanding to all the above instructions. Instructed patient to call VAD clinic with any questions. He verbalized understanding.    [name] RN [place] Coordinator  Office: [phone]  24/7 Pager: [phone]

## 2021-07-11 NOTE — Telephone Encounter (Signed)
Spoke with patient yesterday afternoon re: MRI with cystoscopy that was scheduled for yesterday. Pt states urology office called and canceled procedure stating workup could be completed after LVAD implant.   Discussed with Dr [name]. Would like urology work up completed prior to LVAD implant. I called and left message at Dr [facility] office [phone] requesting call back to discuss rescheduling MRI & cystoscopy pre-VAD.   Made pt aware of need to complete urology work up pre-VAD. He verbalized understanding.   [name] RN [place] Coordinator  Office: [phone]  24/7 Pager: [phone]

## 2021-07-14 ENCOUNTER — Encounter
Payer: Medicaid Other | Attending: Physical Medicine and Rehabilitation | Admitting: Physical Medicine and Rehabilitation

## 2021-07-14 ENCOUNTER — Ambulatory Visit (HOSPITAL_COMMUNITY)
Admission: RE | Admit: 2021-07-14 | Discharge: 2021-07-14 | Disposition: A | Discharge status: Home / Self Care | Payer: Medicaid Other | Source: Ambulatory Visit | Attending: Cardiology | Admitting: Cardiology

## 2021-07-14 ENCOUNTER — Other Ambulatory Visit: Payer: Self-pay

## 2021-07-14 ENCOUNTER — Encounter (HOSPITAL_COMMUNITY): Payer: Self-pay

## 2021-07-14 VITALS — BP 140/98 | HR 90 | Wt 296.4 lb

## 2021-07-14 DIAGNOSIS — Z886 Allergy status to analgesic agent status: Secondary | ICD-10-CM | POA: Diagnosis not present

## 2021-07-14 DIAGNOSIS — N1832 Chronic kidney disease, stage 3b: Secondary | ICD-10-CM | POA: Diagnosis not present

## 2021-07-14 DIAGNOSIS — I130 Hypertensive heart and chronic kidney disease with heart failure and stage 1 through stage 4 chronic kidney disease, or unspecified chronic kidney disease: Secondary | ICD-10-CM | POA: Diagnosis not present

## 2021-07-14 DIAGNOSIS — G4733 Obstructive sleep apnea (adult) (pediatric): Secondary | ICD-10-CM

## 2021-07-14 DIAGNOSIS — Z888 Allergy status to other drugs, medicaments and biological substances status: Secondary | ICD-10-CM | POA: Diagnosis not present

## 2021-07-14 DIAGNOSIS — R310 Gross hematuria: Secondary | ICD-10-CM | POA: Diagnosis not present

## 2021-07-14 DIAGNOSIS — E1122 Type 2 diabetes mellitus with diabetic chronic kidney disease: Secondary | ICD-10-CM | POA: Diagnosis not present

## 2021-07-14 DIAGNOSIS — N281 Cyst of kidney, acquired: Secondary | ICD-10-CM | POA: Insufficient documentation

## 2021-07-14 DIAGNOSIS — I5022 Chronic systolic (congestive) heart failure: Secondary | ICD-10-CM

## 2021-07-14 DIAGNOSIS — Z7901 Long term (current) use of anticoagulants: Secondary | ICD-10-CM | POA: Diagnosis not present

## 2021-07-14 DIAGNOSIS — I480 Paroxysmal atrial fibrillation: Secondary | ICD-10-CM

## 2021-07-14 DIAGNOSIS — Z950 Presence of cardiac pacemaker: Secondary | ICD-10-CM | POA: Insufficient documentation

## 2021-07-14 DIAGNOSIS — Z79899 Other long term (current) drug therapy: Secondary | ICD-10-CM | POA: Insufficient documentation

## 2021-07-14 DIAGNOSIS — I10 Essential (primary) hypertension: Secondary | ICD-10-CM

## 2021-07-14 DIAGNOSIS — R0600 Dyspnea, unspecified: Secondary | ICD-10-CM | POA: Insufficient documentation

## 2021-07-14 DIAGNOSIS — N400 Enlarged prostate without lower urinary tract symptoms: Secondary | ICD-10-CM | POA: Diagnosis not present

## 2021-07-14 DIAGNOSIS — Z6841 Body Mass Index (BMI) 40.0 and over, adult: Secondary | ICD-10-CM | POA: Diagnosis not present

## 2021-07-14 DIAGNOSIS — Z7984 Long term (current) use of oral hypoglycemic drugs: Secondary | ICD-10-CM | POA: Diagnosis not present

## 2021-07-14 DIAGNOSIS — Z794 Long term (current) use of insulin: Secondary | ICD-10-CM | POA: Diagnosis not present

## 2021-07-14 LAB — COMPREHENSIVE METABOLIC PANEL
ALT: 58 U/L — ABNORMAL HIGH (ref 0–44)
AST: 27 U/L (ref 15–41)
Albumin: 3.3 g/dL — ABNORMAL LOW (ref 3.5–5.0)
Alkaline Phosphatase: 59 U/L (ref 38–126)
Anion gap: 8 (ref 5–15)
BUN: 31 mg/dL — ABNORMAL HIGH (ref 6–20)
CO2: 22 mmol/L (ref 22–32)
Calcium: 9.6 mg/dL (ref 8.9–10.3)
Chloride: 110 mmol/L (ref 98–111)
Creatinine, Ser: 1.91 mg/dL — ABNORMAL HIGH (ref 0.61–1.24)
GFR, Estimated: 43 mL/min — ABNORMAL LOW (ref >60)
Glucose, Bld: 135 mg/dL — ABNORMAL HIGH (ref 70–99)
Potassium: 4.3 mmol/L (ref 3.5–5.1)
Sodium: 140 mmol/L (ref 135–145)
Total Bilirubin: 0.9 mg/dL (ref 0.3–1.2)
Total Protein: 6.9 g/dL (ref 6.5–8.1)

## 2021-07-14 LAB — BRAIN NATRIURETIC PEPTIDE: B Natriuretic Peptide: 1021.6 pg/mL — ABNORMAL HIGH (ref 0.0–100.0)

## 2021-07-14 LAB — CBC
HCT: 41.9 % (ref 39.0–52.0)
Hemoglobin: 12.9 g/dL — ABNORMAL LOW (ref 13.0–17.0)
MCH: 24.8 pg — ABNORMAL LOW (ref 26.0–34.0)
MCHC: 30.8 g/dL (ref 30.0–36.0)
MCV: 80.4 fL (ref 80.0–100.0)
Platelets: 303 10*3/uL (ref 150–400)
RBC: 5.21 MIL/uL (ref 4.22–5.81)
RDW: 16 % — ABNORMAL HIGH (ref 11.5–15.5)
WBC: 7.2 10*3/uL (ref 4.0–10.5)
nRBC: 0 % (ref 0.0–0.2)

## 2021-07-14 NOTE — H&P (View-Only) (Signed)
Advanced Heart Failure Clinic Note   Primary Care Provider:  Teena Dunk, NP Primary Cardiologist:  Dr. [name] Primary HF Cardiologist: Dr. [name]  History: Jeffrey [name] is a 45 y.o. male with a history of  poorly controlled HTN, R renal cell carcinoma s/p nephrectomy, CKD IIIa, DM2, OSA, gout, morbid obesity and systolic HF due to NICM.   Echo in 2012 with EF 20-25%.  In 1/13 had nuclear study EF 45-50% suggestive of NICM.  Cath 8/16 showed normal coronaries and moderately elevated filling pressures with moderately depressed cardiac output. [place] s- ICD implanted.  Admitted 9/18 with ADHF in setting of new afib. Started on amiodarone. Pt diuresed and converted to NSR without requiring DCCV. Started on Eliquis for [facility].  Admitted 3/19 with ICD shock. Electrolytes stable. Echo 12/31/17 with LVEF 25-30%.   Pt admitted 8/21 for S-ICD generator change. Course was complicated with acute hypoxic/hypercapnic respiratory failure due to possible flash pulmonary edema. Had traumatic intubation resulting in aspiration and requiring ventilator support and short term pressor support.  Admitted 6/44 with A/C systolic CHF/cardiogenic shock and AF with RVR. He was placed on amio gtt and underwent cardioversion. Had recurrent AF and underwent cardioversion again. Echo showed EF < 20%, severe LV dilation, moderate RV dysfunction. Required NE and dobutamine, which were eventually weaned off. Hospitalization c/b by acute respiratory failure with hypoxia requiring mechanical ventilation. GDMT was limited by hypotension and VAD workup initiated. Underwent RHC, for VAD work up, which showed well-compensated hemodynamics. Creatinine peaked at 4.65 improved to 1.58 which is his baseline. He was discharged to CIR, weight 299 lbs.  Returns for f/u. Is doing well. Has been discussed in [facility] and seen by Dr. Cyndia Bent. Plan for VAD next week. Pending Urology w/u for hematuria. Has MRI and visit  with urology this week. Hematuria currently has resolved. Getting much stronger. Walking without walker. Following diet closely. Still with exertional dyspnea with only mild exertion. No edema, orthopnea or PND.   Cardiac Studies: - Echo (8/22): EF <20%, severe LV dysfunction, grade III DD, RV moderately reduced.  - RHC (8/22): Well-compensated hemodynamics Findings:   RA = 6 RV = 29/3 PA = 35/12 (22) PCW = 16 Fick cardiac output/index = 5.5/2.3 PVR = 1.1 WU Ao sat = 98% PA sat = 65%, 67%  - Echo (9/21): EF 20-25%, severe LV dysfunction, Grade I DD, RV ok. - Echo (3/19): EF 25-30%, Mod MR, mild RV dilation, Mod TR, RVSP 67 mm Hg.  - Echo (4/17): EF 20-25%  Grade III DD - Echo (8/16). EF 20-25%, grade II diastolic dysfunction normal RV size with mild HK    - L/RHC (8/16): RA = 7 RV = 42/3/7 PA =  43/30 (39) PCW = 26 Fick cardiac output/index = 4.9/2.1 PVR = 2.7 WU SVR = 1651 FA sat = 95% PA sat = 65%, 65% Ao = 122/97 (109) LV = 110/31/33  1) Normal coronaries 2) Moderately elevated filling pressures with moderately depressed cardiac output  Review of systems complete and found to be negative unless listed in HPI.    Past Medical History:  Diagnosis Date   Anxiety    Aspirin allergy    Childhood asthma    Chronic systolic CHF (congestive heart failure) ([place])    a. EF 20-25% in 2012. b. EF 45-50% in 10/2011 with nonischemic nuc - presumed NICM. c. 12/2014 Echo: Sev depressed LV fxn, sev dil LV, mild LVH, mild MR, sev dil LA,  mildly reduced RV fxn.   CKD (chronic kidney disease) stage 2, GFR 60-89 ml/min    H/O vasectomy 12/2019   High cholesterol    Hypertension    Morbid obesity ([place])    Nephrolithiasis    OSA on CPAP    Paroxysmal atrial fibrillation ([place])    Presumed NICM    a. 04/2014 Myoview: EF 26%, glob HK, sev glob HK, ? prior infarct;  b. Never cathed 2/2 CKD.   Renal cell carcinoma ([place])    a. s/p Rt robotic assisted partial converted to radical  nephrectomy on 01/2013.   Troponin level elevated    a. 04/2014, 12/2014: felt due to CHF.   Type II diabetes mellitus (HCC)    Ventricular tachycardia    a. appropriate ICD therapy 12/2017   Past Surgical History:  Procedure Laterality Date   APPENDECTOMY  07/2004   BIOPSY  02/10/2021   Procedure: BIOPSY;  Surgeon: Gatha Mayer, MD;  Location: [facility];  Service: Endoscopy;;   CARDIAC CATHETERIZATION N/A 05/17/2015   Procedure: Right/Left Heart Cath and Coronary Angiography;  Surgeon: Jolaine Artist, MD;  Location: [place] [facility];  Service: Cardiovascular;  Laterality: N/A;   CARDIOVERSION N/A 04/30/2021   Procedure: CARDIOVERSION;  Surgeon: Larey Dresser, MD;  Location: [facility] ENDOSCOPY;  Service: Cardiovascular;  Laterality: N/A;   COLONOSCOPY WITH PROPOFOL N/A 02/10/2021   Procedure: COLONOSCOPY WITH PROPOFOL;  Surgeon: Gatha Mayer, MD;  Location: [facility];  Service: Endoscopy;  Laterality: N/A;   EP IMPLANTABLE DEVICE N/A 05/17/2015   Procedure: SubQ ICD Implant;  Surgeon: Will Meredith Leeds, MD;  Location: [place] [facility];  Service: Cardiovascular;  Laterality: N/A;   ESOPHAGOGASTRODUODENOSCOPY (EGD) WITH PROPOFOL N/A 02/10/2021   Procedure: ESOPHAGOGASTRODUODENOSCOPY (EGD) WITH PROPOFOL;  Surgeon: Gatha Mayer, MD;  Location: [facility];  Service: Endoscopy;  Laterality: N/A;   RIGHT HEART CATH N/A 05/13/2021   Procedure: RIGHT HEART CATH;  Surgeon: Jolaine Artist, MD;  Location: [place] [facility];  Service: Cardiovascular;  Laterality: N/A;   ROBOTIC ASSISTED LAPAROSCOPIC LYSIS OF ADHESION  01/13/2013   Procedure: ROBOTIC ASSISTED LAPAROSCOPIC LYSIS OF ADHESION EXTENSIVE;  Surgeon: Alexis Frock, MD;  Location: [facility];  Service: Urology;;   ROBOTIC ASSITED PARTIAL NEPHRECTOMY Right 01/13/2013   Procedure: ROBOTIC ASSITED PARTIAL NEPHRECTOMY CONVERTED TO ROBOTIC ASSISTED RIGHT RADICAL NEPHRECTOMY;  Surgeon: Alexis Frock, MD;  Location: [facility];  Service:  Urology;  Laterality: Right;   SUBQ ICD CHANGEOUT N/A 05/16/2020   Procedure: SUBQ ICD CHANGEOUT;  Surgeon: [name], MD;  Location: [place] [facility];  Service: Cardiovascular;  Laterality: N/A;   SUBQ ICD CHANGEOUT N/A 05/15/2020   Procedure: SUBQ ICD CHANGEOUT;  Surgeon: [name], MD;  Location: [place] [facility];  Service: Cardiovascular;  Laterality: N/A;   TEE WITHOUT CARDIOVERSION N/A 04/30/2021   Procedure: TRANSESOPHAGEAL ECHOCARDIOGRAM (TEE);  Surgeon: Larey Dresser, MD;  Location[facility] ENDOSCOPY;  Service: Cardiovascular;  Laterality: N/A;   VASECTOMY     Allergies  Allergies  Allergen Reactions   Aspirin Shortness Of Breath, Itching and Rash     Burning sensation (Patient reports he tolerates other NSAIDS)    Bee Venom Hives and Swelling   Lisinopril Cough   Shrimp (Diagnostic) Rash   Tomato Rash   Home Medications Current Outpatient Medications  Medication Sig Dispense Refill   acetaminophen (TYLENOL) 325 MG tablet Take 2 tablets (650 mg total) by mouth every 6 (six) hours as  needed for fever.     allopurinol (ZYLOPRIM) 100 MG tablet Take 1 tablet (100 mg total) by mouth daily. 90 tablet 3   ALPRAZolam (XANAX) 0.5 MG tablet Take 1 tablet (0.5 mg total) by mouth 2 (two) times daily. 60 tablet 0   amiodarone (PACERONE) 200 MG tablet Take 0.5 tablets (100 mg total) by mouth daily. 30 tablet 3   apixaban (ELIQUIS) 5 MG TABS tablet Take 1 tablet (5 mg total) by mouth 2 (two) times daily. 60 tablet 3   diclofenac Sodium (VOLTAREN) 1 % GEL Apply 2 g topically 4 (four) times daily. 100 g 0   empagliflozin (JARDIANCE) 10 MG TABS tablet TAKE 1 TABLET (10 MG TOTAL) BY MOUTH DAILY BEFORE BREAKFAST. 90 tablet 3   insulin aspart protamine - aspart (NOVOLOG 70/30 MIX) (70-30) 100 UNIT/ML FlexPen Inject 38 Units into the skin 2 (two) times daily. 15 mL 11   Insulin Syringe-Needle U-100 (TRUEPLUS INSULIN SYRINGE) 31G X 5/16" 0.5 ML MISC USE TO INJECT INSULIN TWICE DAILY.  100 each 11   losartan (COZAAR) 25 MG tablet Take 0.5 tablets (12.5 mg total) by mouth daily. 45 tablet 3   metFORMIN (GLUCOPHAGE) 1000 MG tablet Take 1 tablet (1,000 mg total) by mouth 2 (two) times daily with a meal. 60 tablet 11   pantoprazole (PROTONIX) 40 MG tablet Take 1 tablet (40 mg total) by mouth daily. 30 tablet 0   sertraline (ZOLOFT) 25 MG tablet Take 1 tablet (25 mg total) by mouth daily. 30 tablet 3   spironolactone (ALDACTONE) 25 MG tablet Take 1 tablet (25 mg total) by mouth daily. 30 tablet 6   traMADol (ULTRAM) 50 MG tablet Take 2 tablets (100 mg total) by mouth every 12 (twelve) hours as needed for moderate pain. 30 tablet 0   traZODone (DESYREL) 100 MG tablet Take 1 tablet (100 mg total) by mouth at bedtime. 30 tablet 0   docusate sodium (COLACE) 100 MG capsule Take 1 capsule (100 mg total) by mouth 2 (two) times daily. (Patient not taking: Reported on 07/14/2021) 10 capsule 0   nitroGLYCERIN (NITROSTAT) 0.4 MG SL tablet Place 1 tablet (0.4 mg total) under the tongue every 5 (five) minutes x 3 doses as needed for chest pain. (Patient not taking: Reported on 07/14/2021) 25 tablet 12   polyethylene glycol (MIRALAX / GLYCOLAX) 17 g packet Take 17 g by mouth daily. (Patient not taking: Reported on 07/14/2021) 14 each 0   torsemide (DEMADEX) 20 MG tablet Take 1 tablet (20 mg total) by mouth daily as needed (If wt gain > 3 lb in 24 hr or > 5 lb in 1 week). (Patient not taking: Reported on 07/14/2021) 30 tablet 2   No current facility-administered medications for this encounter.   BP (!) 140/98 Comment: map 110  Pulse 90   Wt 134.4 kg (296 lb 6.4 oz)   SpO2 96%   BMI 46.42 kg/m    Wt Readings from Last 3 Encounters:  07/14/21 134.4 kg (296 lb 6.4 oz)  06/26/21 135.7 kg (299 lb 3.2 oz)  06/16/21 134.3 kg (296 lb)   Physical Exam General:  Well appearing. No resp difficulty HEENT: normal Neck: supple. no JVD. Carotids 2+ bilat; no bruits. No lymphadenopathy or thryomegaly  appreciated. Cor: PMI nondisplaced. Regular rate & rhythm. No rubs, gallops or murmurs. Lungs: clear Abdomen: obese  soft, nontender, nondistended. No hepatosplenomegaly. No bruits or masses. Good bowel sounds. Extremities: no cyanosis, clubbing, rash, edema Neuro: alert & orientedx3, cranial  nerves grossly intact. moves all 4 extremities w/o difficulty. Affect pleasant   Assessment & Plan  1. Chronic systolic CHF - Nonischemic CM, end-stage - Echo (9/21): EF 20-25%.   - Echo this admission (8/22): EF < 20%, severe LV dilation, moderate RV dysfunction.   - RHC (8/22): well compensated hemodynamics. - s/p S-ICD - Recently NYHA IV with shock. Now NYHA III-IIIB - Continue Entresto 24/26 mg bid.  - Continue spiro 25 mg daily - Continue Jardiance 10 mg daily - No BB d/t recent shock. - No Bidil yet. - Body mass index is 46.42 kg/m. Not transplant candidate - Plan admit on 10/14 for RHC followed with pre-VAD optimization over the weekend and VAD implant on Monday. Questions answered.    2 Atrial fibrillation: Paroxysmal - Emergent TEE-guided DCCV 7/27 due to inability to control HR. - Recurrent AF on 8/5. s/p DC-CV on 05/10/21.  - Seen by EP. Not a candidate for ablation due to body habitus  (could consider AVN ablation and CRT, if needed)  - In NSR today - Continue amio 100 daily    3. CKD stage 3B  - Has solitary kidney.   - Baseline creatinine is 1.5-1.7.  - Avoid hypotension. - SCr 1.9 today   4. OSA - Continue CPAP.    5. DM2. Poorly controlled - On Jardiance. - Hgb A1c 9.4.   6. Hematuria - seeing Urology tomorrow for MRI and scope   Total time spent 35 minutes. Over half that time spent discussing above.    Glori Bickers, MD  11:22 AM  07/14/21 11:15 AM

## 2021-07-14 NOTE — Patient Instructions (Signed)
Visit Information  Jeffrey [name] was given information about Medicaid Managed Care team care coordination services as a part of their Healthy [facility] Medicaid benefit. Jeffrey [name] verbally consented to engagement with the [facility] Managed Care team.   If you are experiencing a medical emergency, please call 911 or report to your local emergency department or urgent care.   If you have a non-emergency medical problem during routine business hours, please contact your provider's office and ask to speak with a nurse.   For questions related to your Healthy [facility] health plan, please call: [phone] or visit the homepage here: [email or web address]  If you would like to schedule transportation through your Healthy [facility] plan, please call the following number at least 2 days in advance of your appointment: [phone]  Call the [place] at [phone], at any time, 24 hours a day, 7 days a week. If you are in danger or need immediate medical attention call 911.  If you would like help to quit smoking, call 1-800-QUIT-NOW 906-768-3567) OR Espaol: 1-855-Djelo-Ya (5-631-497-0263) o para ms informacin haga clic aqu or Text READY to 200-400 to register via text  Jeffrey [name] - following are the goals we discussed in your visit today:   Goals Addressed   None     Social Worker will follow up in 45 days.   [name], BSW, [place]  High Risk Managed Medicaid Team  [phone]   Following is a copy of your plan of care:  Care Plan : General Plan of Care (Adult)  Updates made by Ethelda Chick since 07/14/2021 12:00 AM     Problem: Health Promotion or Disease Self-Management (General Plan of Care)   Priority: High  Onset Date: 09/24/2020  Note:   Current Barriers:  Chronic Disease Management support and education needs, CHF, HTN, CKD, OSA, gout, DM2, h/o renal cell carcinoma.  Today, 04/28/21, having right hand and wist pain-neuro referral placed by provider-resolved per patient Update 06/25/21:  patient in need of childcare expense resources-? Voucher.  Has applied for food stamps and disability.  Nurse Case Manager Clinical Goal(s):  Over the next 90 days, patient will attend all scheduled medical appointments:   Interventions:  Inter-disciplinary care team collaboration (see longitudinal plan of care) Collaborated with BSW regarding food stamps, disability updates, and childcare resources for voucher. BSW referral for resources related to childcare expense  Evaluation of current treatment plan and patient's adherence to plan as established by provider.  BSW contacted patient regarding daycare voucher and foodstamps. BSW informed patient he would have to contact DSS and have the child's name placed on the waitlist, patient can also apply for foodstamps at the same time. Patient reports no other resources needed at this time.  Update 11/28/20:  Continue monitoring BP daily and reporting readings outside of parameters. Update 12/27/20:  Blood pressure 128/80. Update 04/28/21:  Patient not currently checking blood pressure. Update 06/25/21:  patient states he is checking blood pressure every day now-120/85 today. Reviewed medications with patient. Discussed plans with patient for ongoing care management follow up and provided patient with direct contact information for care management team Provided patient with educational materials related to exercise. Reviewed scheduled/upcoming provider appointments. Update 11/28/20:  Discussed low glycemic vs high glycemic foods.  Pharmacy referral made for review of medications. Pharmacy referral for medication review. Update 01/22/21:  Pharmacy review completed 01/13/21, follow up 05/15/21. Update 06/25/21:  Cystoscopy scheduled for 07/10/21 and LVAD  07/21/21.  Patient Goals/Self-Care Activities Over the next 90 days, patient will:   -Attends all scheduled provider appointments Calls provider office for new concerns or questions  Follow Up Plan: The Managed Medicaid care management team will reach out to the patient again over the next 30 days.  The patient has been provided with contact information for the Managed Medicaid care management team and has been advised to call with any health related questions or concerns.

## 2021-07-14 NOTE — Progress Notes (Signed)
Advanced Heart Failure Clinic Note   Primary Care Provider:  Teena Dunk, NP Primary Cardiologist:  Dr. [name] Primary HF Cardiologist: Dr. [name]  History: Jeffrey [name] is a 45 y.o. male with a history of  poorly controlled HTN, R renal cell carcinoma s/p nephrectomy, CKD IIIa, DM2, OSA, gout, morbid obesity and systolic HF due to NICM.   Echo in 2012 with EF 20-25%.  In 1/13 had nuclear study EF 45-50% suggestive of NICM.  Cath 8/16 showed normal coronaries and moderately elevated filling pressures with moderately depressed cardiac output. [place] s- ICD implanted.  Admitted 9/18 with ADHF in setting of new afib. Started on amiodarone. Pt diuresed and converted to NSR without requiring DCCV. Started on Eliquis for [facility].  Admitted 3/19 with ICD shock. Electrolytes stable. Echo 12/31/17 with LVEF 25-30%.   Pt admitted 8/21 for S-ICD generator change. Course was complicated with acute hypoxic/hypercapnic respiratory failure due to possible flash pulmonary edema. Had traumatic intubation resulting in aspiration and requiring ventilator support and short term pressor support.  Admitted 9/98 with A/C systolic CHF/cardiogenic shock and AF with RVR. He was placed on amio gtt and underwent cardioversion. Had recurrent AF and underwent cardioversion again. Echo showed EF < 20%, severe LV dilation, moderate RV dysfunction. Required NE and dobutamine, which were eventually weaned off. Hospitalization c/b by acute respiratory failure with hypoxia requiring mechanical ventilation. GDMT was limited by hypotension and VAD workup initiated. Underwent RHC, for VAD work up, which showed well-compensated hemodynamics. Creatinine peaked at 4.65 improved to 1.58 which is his baseline. He was discharged to CIR, weight 299 lbs.  Returns for f/u. Is doing well. Has been discussed in [facility]B and seen by Dr. Cyndia Bent. Plan for VAD next week. Pending Urology w/u for hematuria. Has MRI and visit  with urology this week. Hematuria currently has resolved. Getting much stronger. Walking without walker. Following diet closely. Still with exertional dyspnea with only mild exertion. No edema, orthopnea or PND.   Cardiac Studies: - Echo (8/22): EF <20%, severe LV dysfunction, grade III DD, RV moderately reduced.  - RHC (8/22): Well-compensated hemodynamics Findings:   RA = 6 RV = 29/3 PA = 35/12 (22) PCW = 16 Fick cardiac output/index = 5.5/2.3 PVR = 1.1 WU Ao sat = 98% PA sat = 65%, 67%  - Echo (9/21): EF 20-25%, severe LV dysfunction, Grade I DD, RV ok. - Echo (3/19): EF 25-30%, Mod MR, mild RV dilation, Mod TR, RVSP 67 mm Hg.  - Echo (4/17): EF 20-25%  Grade III DD - Echo (8/16). EF 20-25%, grade II diastolic dysfunction normal RV size with mild HK    - L/RHC (8/16): RA = 7 RV = 42/3/7 PA =  43/30 (39) PCW = 26 Fick cardiac output/index = 4.9/2.1 PVR = 2.7 WU SVR = 1651 FA sat = 95% PA sat = 65%, 65% Ao = 122/97 (109) LV = 110/31/33  1) Normal coronaries 2) Moderately elevated filling pressures with moderately depressed cardiac output  Review of systems complete and found to be negative unless listed in HPI.    Past Medical History:  Diagnosis Date   Anxiety    Aspirin allergy    Childhood asthma    Chronic systolic CHF (congestive heart failure) ([place])    a. EF 20-25% in 2012. b. EF 45-50% in 10/2011 with nonischemic nuc - presumed NICM. c. 12/2014 Echo: Sev depressed LV fxn, sev dil LV, mild LVH, mild MR, sev dil LA,  mildly reduced RV fxn.   CKD (chronic kidney disease) stage 2, GFR 60-89 ml/min    H/O vasectomy 12/2019   High cholesterol    Hypertension    Morbid obesity ([place])    Nephrolithiasis    OSA on CPAP    Paroxysmal atrial fibrillation ([place])    Presumed NICM    a. 04/2014 Myoview: EF 26%, glob HK, sev glob HK, ? prior infarct;  b. Never cathed 2/2 CKD.   Renal cell carcinoma ([place])    a. s/p Rt robotic assisted partial converted to radical  nephrectomy on 01/2013.   Troponin level elevated    a. 04/2014, 12/2014: felt due to CHF.   Type II diabetes mellitus (HCC)    Ventricular tachycardia    a. appropriate ICD therapy 12/2017   Past Surgical History:  Procedure Laterality Date   APPENDECTOMY  07/2004   BIOPSY  02/10/2021   Procedure: BIOPSY;  Surgeon: Gatha Mayer, MD;  Location: [facility];  Service: Endoscopy;;   CARDIAC CATHETERIZATION N/A 05/17/2015   Procedure: Right/Left Heart Cath and Coronary Angiography;  Surgeon: Jolaine Artist, MD;  Location: [place] [facility];  Service: Cardiovascular;  Laterality: N/A;   CARDIOVERSION N/A 04/30/2021   Procedure: CARDIOVERSION;  Surgeon: Larey Dresser, MD;  Location: [facility] ENDOSCOPY;  Service: Cardiovascular;  Laterality: N/A;   COLONOSCOPY WITH PROPOFOL N/A 02/10/2021   Procedure: COLONOSCOPY WITH PROPOFOL;  Surgeon: Gatha Mayer, MD;  Location: [facility];  Service: Endoscopy;  Laterality: N/A;   EP IMPLANTABLE DEVICE N/A 05/17/2015   Procedure: SubQ ICD Implant;  Surgeon: Will Meredith Leeds, MD;  Location: [place] [facility] LAB;  Service: Cardiovascular;  Laterality: N/A;   ESOPHAGOGASTRODUODENOSCOPY (EGD) WITH PROPOFOL N/A 02/10/2021   Procedure: ESOPHAGOGASTRODUODENOSCOPY (EGD) WITH PROPOFOL;  Surgeon: Gatha Mayer, MD;  Location: [facility];  Service: Endoscopy;  Laterality: N/A;   RIGHT HEART CATH N/A 05/13/2021   Procedure: RIGHT HEART CATH;  Surgeon: Jolaine Artist, MD;  Location: [place] [facility];  Service: Cardiovascular;  Laterality: N/A;   ROBOTIC ASSISTED LAPAROSCOPIC LYSIS OF ADHESION  01/13/2013   Procedure: ROBOTIC ASSISTED LAPAROSCOPIC LYSIS OF ADHESION EXTENSIVE;  Surgeon: Alexis Frock, MD;  Location: [facility];  Service: Urology;;   ROBOTIC ASSITED PARTIAL NEPHRECTOMY Right 01/13/2013   Procedure: ROBOTIC ASSITED PARTIAL NEPHRECTOMY CONVERTED TO ROBOTIC ASSISTED RIGHT RADICAL NEPHRECTOMY;  Surgeon: Alexis Frock, MD;  Location: [facility];  Service:  Urology;  Laterality: Right;   SUBQ ICD CHANGEOUT N/A 05/16/2020   Procedure: SUBQ ICD CHANGEOUT;  Surgeon: [name], MD;  Location: [place] [facility];  Service: Cardiovascular;  Laterality: N/A;   SUBQ ICD CHANGEOUT N/A 05/15/2020   Procedure: SUBQ ICD CHANGEOUT;  Surgeon: [name], MD;  Location: [place] [facility];  Service: Cardiovascular;  Laterality: N/A;   TEE WITHOUT CARDIOVERSION N/A 04/30/2021   Procedure: TRANSESOPHAGEAL ECHOCARDIOGRAM (TEE);  Surgeon: Larey Dresser, MD;  Location: [facility] ENDOSCOPY;  Service: Cardiovascular;  Laterality: N/A;   VASECTOMY     Allergies  Allergies  Allergen Reactions   Aspirin Shortness Of Breath, Itching and Rash     Burning sensation (Patient reports he tolerates other NSAIDS)    Bee Venom Hives and Swelling   Lisinopril Cough   Shrimp (Diagnostic) Rash   Tomato Rash   Home Medications Current Outpatient Medications  Medication Sig Dispense Refill   acetaminophen (TYLENOL) 325 MG tablet Take 2 tablets (650 mg total) by mouth every 6 (six) hours as  needed for fever.     allopurinol (ZYLOPRIM) 100 MG tablet Take 1 tablet (100 mg total) by mouth daily. 90 tablet 3   ALPRAZolam (XANAX) 0.5 MG tablet Take 1 tablet (0.5 mg total) by mouth 2 (two) times daily. 60 tablet 0   amiodarone (PACERONE) 200 MG tablet Take 0.5 tablets (100 mg total) by mouth daily. 30 tablet 3   apixaban (ELIQUIS) 5 MG TABS tablet Take 1 tablet (5 mg total) by mouth 2 (two) times daily. 60 tablet 3   diclofenac Sodium (VOLTAREN) 1 % GEL Apply 2 g topically 4 (four) times daily. 100 g 0   empagliflozin (JARDIANCE) 10 MG TABS tablet TAKE 1 TABLET (10 MG TOTAL) BY MOUTH DAILY BEFORE BREAKFAST. 90 tablet 3   insulin aspart protamine - aspart (NOVOLOG 70/30 MIX) (70-30) 100 UNIT/ML FlexPen Inject 38 Units into the skin 2 (two) times daily. 15 mL 11   Insulin Syringe-Needle U-100 (TRUEPLUS INSULIN SYRINGE) 31G X 5/16" 0.5 ML MISC USE TO INJECT INSULIN TWICE DAILY.  100 each 11   losartan (COZAAR) 25 MG tablet Take 0.5 tablets (12.5 mg total) by mouth daily. 45 tablet 3   metFORMIN (GLUCOPHAGE) 1000 MG tablet Take 1 tablet (1,000 mg total) by mouth 2 (two) times daily with a meal. 60 tablet 11   pantoprazole (PROTONIX) 40 MG tablet Take 1 tablet (40 mg total) by mouth daily. 30 tablet 0   sertraline (ZOLOFT) 25 MG tablet Take 1 tablet (25 mg total) by mouth daily. 30 tablet 3   spironolactone (ALDACTONE) 25 MG tablet Take 1 tablet (25 mg total) by mouth daily. 30 tablet 6   traMADol (ULTRAM) 50 MG tablet Take 2 tablets (100 mg total) by mouth every 12 (twelve) hours as needed for moderate pain. 30 tablet 0   traZODone (DESYREL) 100 MG tablet Take 1 tablet (100 mg total) by mouth at bedtime. 30 tablet 0   docusate sodium (COLACE) 100 MG capsule Take 1 capsule (100 mg total) by mouth 2 (two) times daily. (Patient not taking: Reported on 07/14/2021) 10 capsule 0   nitroGLYCERIN (NITROSTAT) 0.4 MG SL tablet Place 1 tablet (0.4 mg total) under the tongue every 5 (five) minutes x 3 doses as needed for chest pain. (Patient not taking: Reported on 07/14/2021) 25 tablet 12   polyethylene glycol (MIRALAX / GLYCOLAX) 17 g packet Take 17 g by mouth daily. (Patient not taking: Reported on 07/14/2021) 14 each 0   torsemide (DEMADEX) 20 MG tablet Take 1 tablet (20 mg total) by mouth daily as needed (If wt gain > 3 lb in 24 hr or > 5 lb in 1 week). (Patient not taking: Reported on 07/14/2021) 30 tablet 2   No current facility-administered medications for this encounter.   BP (!) 140/98 Comment: map 110  Pulse 90   Wt 134.4 kg (296 lb 6.4 oz)   SpO2 96%   BMI 46.42 kg/m    Wt Readings from Last 3 Encounters:  07/14/21 134.4 kg (296 lb 6.4 oz)  06/26/21 135.7 kg (299 lb 3.2 oz)  06/16/21 134.3 kg (296 lb)   Physical Exam General:  Well appearing. No resp difficulty HEENT: normal Neck: supple. no JVD. Carotids 2+ bilat; no bruits. No lymphadenopathy or thryomegaly  appreciated. Cor: PMI nondisplaced. Regular rate & rhythm. No rubs, gallops or murmurs. Lungs: clear Abdomen: obese  soft, nontender, nondistended. No hepatosplenomegaly. No bruits or masses. Good bowel sounds. Extremities: no cyanosis, clubbing, rash, edema Neuro: alert & orientedx3, cranial  nerves grossly intact. moves all 4 extremities w/o difficulty. Affect pleasant   Assessment & Plan  1. Chronic systolic CHF - Nonischemic CM, end-stage - Echo (9/21): EF 20-25%.   - Echo this admission (8/22): EF < 20%, severe LV dilation, moderate RV dysfunction.   - RHC (8/22): well compensated hemodynamics. - s/p S-ICD - Recently NYHA IV with shock. Now NYHA III-IIIB - Continue Entresto 24/26 mg bid.  - Continue spiro 25 mg daily - Continue Jardiance 10 mg daily - No BB d/t recent shock. - No Bidil yet. - Body mass index is 46.42 kg/m. Not transplant candidate - Plan admit on 10/14 for RHC followed with pre-VAD optimization over the weekend and VAD implant on Monday. Questions answered.    2 Atrial fibrillation: Paroxysmal - Emergent TEE-guided DCCV 7/27 due to inability to control HR. - Recurrent AF on 8/5. s/p DC-CV on 05/10/21.  - Seen by EP. Not a candidate for ablation due to body habitus  (could consider AVN ablation and CRT, if needed)  - In NSR today - Continue amio 100 daily    3. CKD stage 3B  - Has solitary kidney.   - Baseline creatinine is 1.5-1.7.  - Avoid hypotension. - SCr 1.9 today   4. OSA - Continue CPAP.    5. DM2. Poorly controlled - On Jardiance. - Hgb A1c 9.4.   6. Hematuria - seeing Urology tomorrow for MRI and scope   Total time spent 35 minutes. Over half that time spent discussing above.    Glori Bickers, MD  11:22 AM  07/14/21 11:15 AM

## 2021-07-14 NOTE — Progress Notes (Signed)
Patient presents for 6 week f/u in [facility] today. Pt for admission on 07/18/21 for RHC with Dr [name]. Scheduled for VAD implant 07/11/21 with Dr Cyndia Bent.   Pt states he has been doing great since his last visit. He walked independently into clinic today. Reports he has not needed to use his walker in 2.5 weeks.  He is following a strict low salt/low carb diet. Drinking no more than 2 L of fluid a day. Exercising daily. Denies lightheadedness, dizziness, or falls. Reports 1 episode of shortness of breath over the weekend walking from a restaurant to the car, but this quickly resolved. Denies signes of bleeding. Hematuria resolved a few weeks ago.   Reviewed the following plan with patient:  1. VAD clinic appt 07/14/21 at 0900 2. 07/15/21: MRI abdomen/pelvis at 0800 & 0900 at [facility]. NPO 4  hours prior. 3. 07/1121: Appt with Dr Diamantina Providence at the [facility] Urology office at 1:30 pm [place]             To arrive at 1:15 pm to complete urine specimen ([address]) 4. Stop Eliquis tomorrow 07/15/21 5. COVID test on Wednesday 6. [place] on Friday at 0730  Provided patient with copy of letter sent on 9/20 with addresses and appointment times. All questions answered.  Reports he is out of Xanax. Ok to refill per Dr [name]. Called in to pt's pharmacy.   BP slightly elevated today. Currently taking Losartan 12.5 mg daily. Discussed with Dr [name]- no changes at this time.   Vital Signs:  HR: 90 BP: 140/98 (110)  SPO2: 96 %   Weight: 296.4 lb w/o eqt Last weight: 299.3 lb Home weights: 288 - 292 lbs   Symptom YES NO DETAILS  Angina  x Activity:  Claudication  x How Far:  Syncope  x When:   Stroke  x   Orthopnea  x How many pillows:  PND  x How often:  CPAP  x How many hours:  Pedal Edema  x   Abdominal Fullness  x   Nausea / Vomit  x   Diaphoresis  x When:  Shortness of Breath X  Activity: 1 episode of the weekend walking from a  restaurant to his car. Resolved quickly.   Palpitations  x When:  ICD shock  x   Bleeding S/S  x Reports hematuria resolved a few weeks ago  Tea-colored Urine  x   Hospitalizations  x   Emergency Room  x   Other MD  x   Activity   Fluid   Diet       Device: [employer]  Therapies: on VF 240 VT 220 Last check: 05/28/21   Patient Instructions:  Stop Eliquis 07/15/21 (tomorrow) No medication changes today 07/18/21- Check in at admissions at 0530 for right heart cath at 0730 with Dr [name] Other appts as discussed (letter provided)  [name] RN [place] Coordinator  Office: [phone]  24/7 Pager: [phone]

## 2021-07-14 NOTE — Patient Instructions (Addendum)
Stop Eliquis 07/15/21 (tomorrow) No medication changes today 07/18/21- Check in at admissions at 0530 for right heart cath at 0730 with Dr [name] Other appointments as discussed (letter provided)

## 2021-07-14 NOTE — Patient Outreach (Signed)
Medicaid Managed Care Social Work Note  07/14/2021 Name:  Jeffrey [name] MRN:  [id number] DOB:  24-Jan-1976  Jeffrey [name] is an 45 y.o. year old male who is a primary patient of [place], Jake Church I, NP.  The [facility] Managed Care Coordination team was consulted for assistance with:  Food Insecurity Daycare voucher  [name] was given information about Medicaid Managed Care Coordination team services today. Jeffrey [name] Patient agreed to services and verbal consent obtained.  Engaged with patient  for by telephone forinitial visit in response to referral for case management and/or care coordination services.   Assessments/Interventions:  Review of past medical history, allergies, medications, health status, including review of consultants reports, laboratory and other test data, was performed as part of comprehensive evaluation and provision of chronic care management services.  SDOH: (Social Determinant of Health) assessments and interventions performed:  SW contacted patient regarding daycare voucher and foodstamps. BSW informed patient he would have to contact DSS and have the child's name placed on the waitlist, patient can also apply for foodstamps at the same time. Patient reports no other resources needed at this time.   Advanced Directives Status:  Not addressed in this encounter.  Care Plan                 Allergies  Allergen Reactions   Aspirin Shortness Of Breath, Itching and Rash     Burning sensation (Patient reports he tolerates other NSAIDS)    Bee Venom Hives and Swelling   Lisinopril Cough   Shrimp (Diagnostic) Rash   Tomato Rash    Medications Reviewed Today     Reviewed by Mertha Baars, RN (Registered Nurse) on 07/14/21 at [phone]  Med List Status: <None>   Medication Order Taking? Sig Documenting Provider Last Dose Status Informant  acetaminophen (TYLENOL) 325 MG tablet [id number] Yes Take 2 tablets (650 mg total) by mouth every 6 (six) hours as needed for  fever. Darrick Grinder D, NP Taking Active   allopurinol (ZYLOPRIM) 100 MG tablet [id number] Yes Take 1 tablet (100 mg total) by mouth daily. Cathlyn Parsons, PA-C Taking Active   ALPRAZolam Duanne Moron) 0.5 MG tablet [id number] Yes Take 1 tablet (0.5 mg total) by mouth 2 (two) times daily. Cathlyn Parsons, PA-C Taking Active   amiodarone (PACERONE) 200 MG tablet [id number] Yes Take 0.5 tablets (100 mg total) by mouth daily. Bensimhon, Shaune Pascal, MD Taking Active   apixaban (ELIQUIS) 5 MG TABS tablet [id number] Yes Take 1 tablet (5 mg total) by mouth 2 (two) times daily. Bensimhon, Shaune Pascal, MD Taking Active   diclofenac Sodium (VOLTAREN) 1 % GEL [id number] Yes Apply 2 g topically 4 (four) times daily. Izora Ribas, MD Taking Active   docusate sodium (COLACE) 100 MG capsule [id number] No Take 1 capsule (100 mg total) by mouth 2 (two) times daily.  Patient not taking: Reported on 07/14/2021   Darrick Grinder D, NP Not Taking Active   empagliflozin (JARDIANCE) 10 MG TABS tablet [id number] Yes TAKE 1 TABLET (10 MG TOTAL) BY MOUTH DAILY BEFORE BREAKFAST. Angiulli, Lavon Paganini, PA-C Taking Active   insulin aspart protamine - aspart (NOVOLOG 70/30 MIX) (70-30) 100 UNIT/ML FlexPen [id number] Yes Inject 38 Units into the skin 2 (two) times daily. [place], Maricela Bo, FNP Taking Active   Insulin Syringe-Needle U-100 (TRUEPLUS INSULIN SYRINGE) 31G X 5/16" 0.5 ML MISC [id number] Yes USE TO INJECT INSULIN TWICE DAILY. Bo Merino I, NP Taking Active   losartan (COZAAR) 25 MG  tablet [id number] Yes Take 0.5 tablets (12.5 mg total) by mouth daily. [place], Maricela Bo, FNP Taking Active   metFORMIN (GLUCOPHAGE) 1000 MG tablet [id number] Yes Take 1 tablet (1,000 mg total) by mouth 2 (two) times daily with a meal. [place], Maricela Bo, FNP Taking Active   nitroGLYCERIN (NITROSTAT) 0.4 MG SL tablet [id number] No Place 1 tablet (0.4 mg total) under the tongue every 5 (five) minutes x 3 doses as needed for chest pain.  Patient not taking:  Reported on 07/14/2021   Bensimhon, Shaune Pascal, MD Not Taking Active   pantoprazole (PROTONIX) 40 MG tablet [id number] Yes Take 1 tablet (40 mg total) by mouth daily. Cathlyn Parsons, PA-C Taking Active   polyethylene glycol (MIRALAX / GLYCOLAX) 17 g packet [id number] No Take 17 g by mouth daily.  Patient not taking: Reported on 07/14/2021   Cathlyn Parsons, PA-C Not Taking Active   sertraline (ZOLOFT) 25 MG tablet [id number] Yes Take 1 tablet (25 mg total) by mouth daily. Bensimhon, Shaune Pascal, MD Taking Active   spironolactone (ALDACTONE) 25 MG tablet [id number] Yes Take 1 tablet (25 mg total) by mouth daily. Cathlyn Parsons, PA-C Taking Active   torsemide (DEMADEX) 20 MG tablet [id number] No Take 1 tablet (20 mg total) by mouth daily as needed (If wt gain > 3 lb in 24 hr or > 5 lb in 1 week).  Patient not taking: Reported on 07/14/2021   Izora Ribas, MD Not Taking Active   traMADol (ULTRAM) 50 MG tablet [id number] Yes Take 2 tablets (100 mg total) by mouth every 12 (twelve) hours as needed for moderate pain. Cathlyn Parsons, PA-C Taking Active   traZODone (DESYREL) 100 MG tablet [id number] Yes Take 1 tablet (100 mg total) by mouth at bedtime. Cathlyn Parsons, PA-C Taking Active             Patient Active Problem List   Diagnosis Date Noted   Cardiogenic shock [facility])    Atrial fibrillation with RVR ([place]) 04/30/2021   Acute hypoxemic respiratory failure (HCC)    Right wrist pain 04/16/2021   Absolute anemia    Rectal bleeding    Iron deficiency 12/25/2020   Long term current use of anticoagulant - apixaban 12/25/2020   Paroxysmal atrial fibrillation (HCC)    Lactic acidosis 11/22/2020   Hypotension 11/22/2020   Aspiration into airway    ICD (implantable cardioverter-defibrillator) battery depletion 05/15/2020   Endotracheal tube present    Respiratory failure ([place])    Acute encephalopathy    Acute pulmonary edema (HCC)    NICM (nonischemic cardiomyopathy) ([place])  04/24/2020   S/P vasectomy 12/20/2019   Anxiety 12/20/2019   Hemoglobin A1C between 7% and 9% indicating borderline diabetic control ([place]) 12/20/2019   Seasonal allergies 12/20/2019   Insomnia 12/20/2019   Vasectomy evaluation 06/20/2019   Visual problems 06/20/2019   Blurry vision, bilateral 06/20/2019   Hyperglycemia 02/13/2019   Hyperosmolar (nonketotic) coma ([place]) 01/19/2019   AICD (automatic cardioverter/defibrillator) present 01/19/2019   Hyperlipidemia 12/07/2018   Follow-up exam 11/22/2018   Class 3 severe obesity due to excess calories with serious comorbidity and body mass index (BMI) of 45.0 to 49.9 in adult [facility]) 11/22/2018   Dizziness 11/22/2018   Lingular pneumonia 08/04/2018   Ventricular tachycardia 12/30/2017   PAF (paroxysmal atrial fibrillation) ([place])    Dilated cardiomyopathy ([place])    Pollen allergy 02/17/2017   Dyslipidemia 02/17/2017   Hematochezia 11/08/2015   CKD (chronic kidney disease), stage III ([place]) 06/20/2015  Cardiac defibrillator in situ    Chronic systolic CHF (congestive heart failure) ([place]) 03/11/2015   Gouty arthritis 03/11/2015   Chest pain 12/28/2014   Acute renal failure superimposed on stage 3 chronic kidney disease ([place]) 04/08/2014   Aspirin allergy 04/08/2014   Renal cell carcinoma ([place])    Gout attack 11/10/2011   Morbid obesity ([place]) 10/23/2011   OSA (obstructive sleep apnea) 10/23/2011   Type 2 diabetes mellitus with stage 3 chronic kidney disease, with long-term current use of insulin ([place]) 10/22/2011   Hyperlipidemia associated with type 2 diabetes mellitus ([place]) 12/25/2010   Hypertension associated with diabetes ([place]) 12/25/2010    Conditions to be addressed/monitored per PCP order:   Daycare voucher  Care Plan : General Plan of Care (Adult)  Updates made by Ethelda Chick since 07/14/2021 12:00 AM     Problem: Health Promotion or Disease Self-Management (General Plan of Care)   Priority: High  Onset Date: 09/24/2020   Note:   Current Barriers:  Chronic Disease Management support and education needs, CHF, HTN, CKD, OSA, gout, DM2, h/o renal cell carcinoma. Today, 04/28/21, having right hand and wist pain-neuro referral placed by provider-resolved per patient Update 06/25/21:  patient in need of childcare expense resources-? Voucher.  Has applied for food stamps and disability.  Nurse Case Manager Clinical Goal(s):  Over the next 90 days, patient will attend all scheduled medical appointments:   Interventions:  Inter-disciplinary care team collaboration (see longitudinal plan of care) Collaborated with BSW regarding food stamps, disability updates, and childcare resources for voucher. BSW referral for resources related to childcare expense  Evaluation of current treatment plan and patient's adherence to plan as established by provider.  BSW contacted patient regarding daycare voucher and foodstamps. BSW informed patient he would have to contact DSS and have the child's name placed on the waitlist, patient can also apply for foodstamps at the same time. Patient reports no other resources needed at this time.  Update 11/28/20:  Continue monitoring BP daily and reporting readings outside of parameters. Update 12/27/20:  Blood pressure 128/80. Update 04/28/21:  Patient not currently checking blood pressure. Update 06/25/21:  patient states he is checking blood pressure every day now-120/85 today. Reviewed medications with patient. Discussed plans with patient for ongoing care management follow up and provided patient with direct contact information for care management team Provided patient with educational materials related to exercise. Reviewed scheduled/upcoming provider appointments. Update 11/28/20:  Discussed low glycemic vs high glycemic foods.  Pharmacy referral made for review of medications. Pharmacy referral for medication review. Update 01/22/21:  Pharmacy review completed 01/13/21, follow up  05/15/21. Update 06/25/21:  Cystoscopy scheduled for 07/10/21 and LVAD 07/21/21.  Patient Goals/Self-Care Activities Over the next 90 days, patient will:  -Attends all scheduled provider appointments Calls provider office for new concerns or questions  Follow Up Plan: The Managed Medicaid care management team will reach out to the patient again over the next 30 days.  The patient has been provided with contact information for the Managed Medicaid care management team and has been advised to call with any health related questions or concerns.         Follow up:  Patient agrees to Care Plan and Follow-up.  Plan: The Managed Medicaid care management team will reach out to the patient again over the next 45 days.  Date/time of next scheduled Social Work care management/care coordination outreach: 09/04/21  [name], [place], [place]  High Risk  Managed Medicaid Team  [phone]

## 2021-07-15 ENCOUNTER — Other Ambulatory Visit (HOSPITAL_COMMUNITY): Payer: Self-pay

## 2021-07-15 ENCOUNTER — Encounter: Payer: Self-pay | Admitting: Urology

## 2021-07-15 ENCOUNTER — Ambulatory Visit (INDEPENDENT_AMBULATORY_CARE_PROVIDER_SITE_OTHER): Payer: Medicaid Other | Admitting: Urology

## 2021-07-15 ENCOUNTER — Ambulatory Visit (HOSPITAL_COMMUNITY): Payer: Medicaid Other

## 2021-07-15 ENCOUNTER — Other Ambulatory Visit: Payer: Self-pay

## 2021-07-15 ENCOUNTER — Encounter (HOSPITAL_COMMUNITY): Payer: Medicaid Other

## 2021-07-15 ENCOUNTER — Ambulatory Visit (HOSPITAL_COMMUNITY)
Admission: RE | Admit: 2021-07-15 | Discharge: 2021-07-15 | Disposition: A | Discharge status: Home / Self Care | Payer: Medicaid Other | Source: Ambulatory Visit | Attending: Urology | Admitting: Urology

## 2021-07-15 ENCOUNTER — Other Ambulatory Visit: Payer: Medicaid Other | Admitting: Urology

## 2021-07-15 VITALS — BP 118/85 | HR 96 | Ht 67.0 in | Wt 300.0 lb

## 2021-07-15 DIAGNOSIS — Z79899 Other long term (current) drug therapy: Secondary | ICD-10-CM | POA: Diagnosis not present

## 2021-07-15 DIAGNOSIS — R319 Hematuria, unspecified: Secondary | ICD-10-CM

## 2021-07-15 DIAGNOSIS — Z7901 Long term (current) use of anticoagulants: Secondary | ICD-10-CM | POA: Diagnosis not present

## 2021-07-15 DIAGNOSIS — Z7984 Long term (current) use of oral hypoglycemic drugs: Secondary | ICD-10-CM | POA: Diagnosis not present

## 2021-07-15 DIAGNOSIS — N1832 Chronic kidney disease, stage 3b: Secondary | ICD-10-CM | POA: Diagnosis not present

## 2021-07-15 DIAGNOSIS — Z6841 Body Mass Index (BMI) 40.0 and over, adult: Secondary | ICD-10-CM | POA: Diagnosis not present

## 2021-07-15 DIAGNOSIS — R310 Gross hematuria: Secondary | ICD-10-CM

## 2021-07-15 DIAGNOSIS — Z85528 Personal history of other malignant neoplasm of kidney: Secondary | ICD-10-CM | POA: Diagnosis not present

## 2021-07-15 DIAGNOSIS — N281 Cyst of kidney, acquired: Secondary | ICD-10-CM | POA: Diagnosis not present

## 2021-07-15 DIAGNOSIS — E1122 Type 2 diabetes mellitus with diabetic chronic kidney disease: Secondary | ICD-10-CM | POA: Diagnosis not present

## 2021-07-15 DIAGNOSIS — R0600 Dyspnea, unspecified: Secondary | ICD-10-CM | POA: Diagnosis not present

## 2021-07-15 DIAGNOSIS — I480 Paroxysmal atrial fibrillation: Secondary | ICD-10-CM | POA: Diagnosis not present

## 2021-07-15 DIAGNOSIS — G4733 Obstructive sleep apnea (adult) (pediatric): Secondary | ICD-10-CM | POA: Diagnosis not present

## 2021-07-15 DIAGNOSIS — N400 Enlarged prostate without lower urinary tract symptoms: Secondary | ICD-10-CM | POA: Diagnosis not present

## 2021-07-15 DIAGNOSIS — Z950 Presence of cardiac pacemaker: Secondary | ICD-10-CM | POA: Diagnosis not present

## 2021-07-15 DIAGNOSIS — I5022 Chronic systolic (congestive) heart failure: Secondary | ICD-10-CM | POA: Diagnosis not present

## 2021-07-15 DIAGNOSIS — Z886 Allergy status to analgesic agent status: Secondary | ICD-10-CM | POA: Diagnosis not present

## 2021-07-15 DIAGNOSIS — Z888 Allergy status to other drugs, medicaments and biological substances status: Secondary | ICD-10-CM | POA: Diagnosis not present

## 2021-07-15 DIAGNOSIS — Z794 Long term (current) use of insulin: Secondary | ICD-10-CM | POA: Diagnosis not present

## 2021-07-15 DIAGNOSIS — I130 Hypertensive heart and chronic kidney disease with heart failure and stage 1 through stage 4 chronic kidney disease, or unspecified chronic kidney disease: Secondary | ICD-10-CM | POA: Diagnosis not present

## 2021-07-15 MED ORDER — CEPHALEXIN 250 MG PO CAPS
500.0000 mg | ORAL_CAPSULE | Freq: Once | ORAL | Status: AC
  Administered 2021-07-15: 500 mg via ORAL

## 2021-07-15 MED ORDER — GADOBUTROL 1 MMOL/ML IV SOLN
10.0000 mL | Freq: Once | INTRAVENOUS | Status: AC | PRN
  Administered 2021-07-15: 10 mL via INTRAVENOUS

## 2021-07-15 MED ORDER — ALPRAZOLAM 0.5 MG PO TABS
0.5000 mg | ORAL_TABLET | Freq: Two times a day (BID) | ORAL | 4 refills | Status: DC
  Filled 2021-07-15: qty 60, 30d supply, fill #0

## 2021-07-15 NOTE — Progress Notes (Signed)
Per order, changed device settings for MRI to  MRI protection mode/Tachy-therapies to off  Will program device back to pre-MRI settings after completion of exam, and send transmission.

## 2021-07-15 NOTE — Progress Notes (Signed)
Cystoscopy Procedure Note:  Indication: Gross hematuria  After informed consent and discussion of the procedure and its risks, Jeffrey [name] was positioned and prepped in the standard fashion. Cystoscopy was performed with a flexible cystoscope. The urethra, bladder neck and entire bladder was visualized in a standard fashion.  There was some mild erythema in the proximal urethra likely from prior catheters.  The prostate was moderate in size with a high bladder neck. The ureteral orifices were visualized in their normal location and orientation.  Mild bladder trabeculations, bladder otherwise grossly normal, no abnormalities on retroflexion.  Cytology sent  Imaging: I personally reviewed the MRI that shows no obvious urologic abnormalities, formal read pending  Findings: Normal cystoscopy, mild erythema in the proximal urethra likely from recent traumatic catheter placement  Assessment and Plan: Will call with finalized MRI results, follow-up with urology as needed Call with cytology results  Nickolas Madrid, MD 07/15/2021

## 2021-07-16 ENCOUNTER — Other Ambulatory Visit: Payer: Self-pay | Admitting: Surgery

## 2021-07-16 ENCOUNTER — Telehealth: Payer: Self-pay

## 2021-07-16 LAB — SARS CORONAVIRUS 2 (TAT 6-24 HRS): SARS Coronavirus 2: NEGATIVE

## 2021-07-16 NOTE — Telephone Encounter (Signed)
Called pt informed him of the information below. Pt gave verbal understanding.  

## 2021-07-16 NOTE — Telephone Encounter (Signed)
-----   Message from Billey Co, MD sent at 07/16/2021  2:12 PM EDT ----- Good news, MRI normal, follow-up with urology as needed

## 2021-07-17 LAB — CYTOLOGY - NON PAP

## 2021-07-18 ENCOUNTER — Encounter (HOSPITAL_COMMUNITY): Payer: Self-pay | Admitting: Internal Medicine

## 2021-07-18 ENCOUNTER — Inpatient Hospital Stay (HOSPITAL_COMMUNITY)
Admission: RE | Admit: 2021-07-18 | Discharge: 2021-08-08 | DRG: 001 | Disposition: A | Discharge status: Home Health | Payer: Medicaid Other | Attending: Internal Medicine | Admitting: Internal Medicine

## 2021-07-18 ENCOUNTER — Inpatient Hospital Stay (HOSPITAL_COMMUNITY): Payer: Medicaid Other

## 2021-07-18 ENCOUNTER — Other Ambulatory Visit: Payer: Self-pay

## 2021-07-18 ENCOUNTER — Inpatient Hospital Stay (HOSPITAL_COMMUNITY)
Admission: RE | Disposition: A | Discharge status: Home Health | Payer: Self-pay | Source: Home / Self Care | Attending: Internal Medicine

## 2021-07-18 DIAGNOSIS — I272 Pulmonary hypertension, unspecified: Secondary | ICD-10-CM | POA: Diagnosis present

## 2021-07-18 DIAGNOSIS — Z8249 Family history of ischemic heart disease and other diseases of the circulatory system: Secondary | ICD-10-CM

## 2021-07-18 DIAGNOSIS — Z6841 Body Mass Index (BMI) 40.0 and over, adult: Secondary | ICD-10-CM | POA: Diagnosis not present

## 2021-07-18 DIAGNOSIS — J9811 Atelectasis: Secondary | ICD-10-CM | POA: Diagnosis not present

## 2021-07-18 DIAGNOSIS — R42 Dizziness and giddiness: Secondary | ICD-10-CM | POA: Diagnosis present

## 2021-07-18 DIAGNOSIS — I517 Cardiomegaly: Secondary | ICD-10-CM | POA: Diagnosis not present

## 2021-07-18 DIAGNOSIS — R0902 Hypoxemia: Secondary | ICD-10-CM | POA: Diagnosis not present

## 2021-07-18 DIAGNOSIS — I472 Ventricular tachycardia, unspecified: Secondary | ICD-10-CM | POA: Diagnosis not present

## 2021-07-18 DIAGNOSIS — I340 Nonrheumatic mitral (valve) insufficiency: Secondary | ICD-10-CM | POA: Diagnosis not present

## 2021-07-18 DIAGNOSIS — E7800 Pure hypercholesterolemia, unspecified: Secondary | ICD-10-CM | POA: Diagnosis present

## 2021-07-18 DIAGNOSIS — I4819 Other persistent atrial fibrillation: Secondary | ICD-10-CM | POA: Diagnosis not present

## 2021-07-18 DIAGNOSIS — K5900 Constipation, unspecified: Secondary | ICD-10-CM | POA: Diagnosis not present

## 2021-07-18 DIAGNOSIS — E1122 Type 2 diabetes mellitus with diabetic chronic kidney disease: Secondary | ICD-10-CM | POA: Diagnosis present

## 2021-07-18 DIAGNOSIS — M109 Gout, unspecified: Secondary | ICD-10-CM | POA: Diagnosis not present

## 2021-07-18 DIAGNOSIS — Z79899 Other long term (current) drug therapy: Secondary | ICD-10-CM

## 2021-07-18 DIAGNOSIS — E872 Acidosis, unspecified: Secondary | ICD-10-CM | POA: Diagnosis not present

## 2021-07-18 DIAGNOSIS — Z91013 Allergy to seafood: Secondary | ICD-10-CM

## 2021-07-18 DIAGNOSIS — I4892 Unspecified atrial flutter: Secondary | ICD-10-CM | POA: Diagnosis not present

## 2021-07-18 DIAGNOSIS — R570 Cardiogenic shock: Secondary | ICD-10-CM | POA: Diagnosis present

## 2021-07-18 DIAGNOSIS — Y92239 Unspecified place in hospital as the place of occurrence of the external cause: Secondary | ICD-10-CM | POA: Diagnosis not present

## 2021-07-18 DIAGNOSIS — I480 Paroxysmal atrial fibrillation: Secondary | ICD-10-CM | POA: Diagnosis not present

## 2021-07-18 DIAGNOSIS — Z95811 Presence of heart assist device: Secondary | ICD-10-CM

## 2021-07-18 DIAGNOSIS — Z91018 Allergy to other foods: Secondary | ICD-10-CM

## 2021-07-18 DIAGNOSIS — Z886 Allergy status to analgesic agent status: Secondary | ICD-10-CM | POA: Diagnosis not present

## 2021-07-18 DIAGNOSIS — I130 Hypertensive heart and chronic kidney disease with heart failure and stage 1 through stage 4 chronic kidney disease, or unspecified chronic kidney disease: Principal | ICD-10-CM | POA: Diagnosis present

## 2021-07-18 DIAGNOSIS — E1165 Type 2 diabetes mellitus with hyperglycemia: Secondary | ICD-10-CM | POA: Diagnosis not present

## 2021-07-18 DIAGNOSIS — Z833 Family history of diabetes mellitus: Secondary | ICD-10-CM

## 2021-07-18 DIAGNOSIS — Z95810 Presence of automatic (implantable) cardiac defibrillator: Secondary | ICD-10-CM

## 2021-07-18 DIAGNOSIS — I459 Conduction disorder, unspecified: Secondary | ICD-10-CM | POA: Diagnosis present

## 2021-07-18 DIAGNOSIS — I5022 Chronic systolic (congestive) heart failure: Secondary | ICD-10-CM | POA: Diagnosis not present

## 2021-07-18 DIAGNOSIS — D72829 Elevated white blood cell count, unspecified: Secondary | ICD-10-CM | POA: Diagnosis not present

## 2021-07-18 DIAGNOSIS — Z452 Encounter for adjustment and management of vascular access device: Secondary | ICD-10-CM

## 2021-07-18 DIAGNOSIS — Z85528 Personal history of other malignant neoplasm of kidney: Secondary | ICD-10-CM

## 2021-07-18 DIAGNOSIS — N1832 Chronic kidney disease, stage 3b: Secondary | ICD-10-CM | POA: Diagnosis not present

## 2021-07-18 DIAGNOSIS — N183 Chronic kidney disease, stage 3 unspecified: Secondary | ICD-10-CM | POA: Diagnosis not present

## 2021-07-18 DIAGNOSIS — T502X5A Adverse effect of carbonic-anhydrase inhibitors, benzothiadiazides and other diuretics, initial encounter: Secondary | ICD-10-CM | POA: Diagnosis not present

## 2021-07-18 DIAGNOSIS — M25461 Effusion, right knee: Secondary | ICD-10-CM | POA: Diagnosis not present

## 2021-07-18 DIAGNOSIS — Z87891 Personal history of nicotine dependence: Secondary | ICD-10-CM

## 2021-07-18 DIAGNOSIS — G4733 Obstructive sleep apnea (adult) (pediatric): Secondary | ICD-10-CM | POA: Diagnosis not present

## 2021-07-18 DIAGNOSIS — I5084 End stage heart failure: Secondary | ICD-10-CM | POA: Diagnosis present

## 2021-07-18 DIAGNOSIS — I4891 Unspecified atrial fibrillation: Secondary | ICD-10-CM | POA: Diagnosis not present

## 2021-07-18 DIAGNOSIS — E119 Type 2 diabetes mellitus without complications: Secondary | ICD-10-CM | POA: Diagnosis not present

## 2021-07-18 DIAGNOSIS — I428 Other cardiomyopathies: Secondary | ICD-10-CM | POA: Diagnosis not present

## 2021-07-18 DIAGNOSIS — Z20822 Contact with and (suspected) exposure to covid-19: Secondary | ICD-10-CM | POA: Diagnosis present

## 2021-07-18 DIAGNOSIS — Z95828 Presence of other vascular implants and grafts: Secondary | ICD-10-CM

## 2021-07-18 DIAGNOSIS — I5023 Acute on chronic systolic (congestive) heart failure: Secondary | ICD-10-CM | POA: Diagnosis not present

## 2021-07-18 DIAGNOSIS — I5082 Biventricular heart failure: Secondary | ICD-10-CM | POA: Diagnosis present

## 2021-07-18 DIAGNOSIS — Z905 Acquired absence of kidney: Secondary | ICD-10-CM

## 2021-07-18 DIAGNOSIS — R319 Hematuria, unspecified: Secondary | ICD-10-CM | POA: Diagnosis present

## 2021-07-18 DIAGNOSIS — Z87442 Personal history of urinary calculi: Secondary | ICD-10-CM

## 2021-07-18 DIAGNOSIS — Z91030 Bee allergy status: Secondary | ICD-10-CM

## 2021-07-18 HISTORY — PX: RIGHT HEART CATH: CATH118263

## 2021-07-18 LAB — POCT I-STAT EG7
Acid-base deficit: 4 mmol/L — ABNORMAL HIGH (ref 0.0–2.0)
Acid-base deficit: 5 mmol/L — ABNORMAL HIGH (ref 0.0–2.0)
Bicarbonate: 20.4 mmol/L (ref 20.0–28.0)
Bicarbonate: 21.9 mmol/L (ref 20.0–28.0)
Calcium, Ion: 1.09 mmol/L — ABNORMAL LOW (ref 1.15–1.40)
Calcium, Ion: 1.3 mmol/L (ref 1.15–1.40)
HCT: 38 % — ABNORMAL LOW (ref 39.0–52.0)
HCT: 40 % (ref 39.0–52.0)
Hemoglobin: 12.9 g/dL — ABNORMAL LOW (ref 13.0–17.0)
Hemoglobin: 13.6 g/dL (ref 13.0–17.0)
O2 Saturation: 50 %
O2 Saturation: 50 %
Potassium: 3.6 mmol/L (ref 3.5–5.1)
Potassium: 4.1 mmol/L (ref 3.5–5.1)
Sodium: 144 mmol/L (ref 135–145)
Sodium: 147 mmol/L — ABNORMAL HIGH (ref 135–145)
TCO2: 22 mmol/L (ref 22–32)
TCO2: 23 mmol/L (ref 22–32)
pCO2, Ven: 38.6 mmHg — ABNORMAL LOW (ref 44.0–60.0)
pCO2, Ven: 41.5 mmHg — ABNORMAL LOW (ref 44.0–60.0)
pH, Ven: 7.331 (ref 7.250–7.430)
pH, Ven: 7.331 (ref 7.250–7.430)
pO2, Ven: 29 mmHg — CL (ref 32.0–45.0)
pO2, Ven: 29 mmHg — CL (ref 32.0–45.0)

## 2021-07-18 LAB — CBC
HCT: 38.1 % — ABNORMAL LOW (ref 39.0–52.0)
Hemoglobin: 12 g/dL — ABNORMAL LOW (ref 13.0–17.0)
MCH: 25.3 pg — ABNORMAL LOW (ref 26.0–34.0)
MCHC: 31.5 g/dL (ref 30.0–36.0)
MCV: 80.4 fL (ref 80.0–100.0)
Platelets: 321 10*3/uL (ref 150–400)
RBC: 4.74 MIL/uL (ref 4.22–5.81)
RDW: 16 % — ABNORMAL HIGH (ref 11.5–15.5)
WBC: 5.1 10*3/uL (ref 4.0–10.5)
nRBC: 0 % (ref 0.0–0.2)

## 2021-07-18 LAB — GLUCOSE, CAPILLARY
Glucose-Capillary: 102 mg/dL — ABNORMAL HIGH (ref 70–99)
Glucose-Capillary: 116 mg/dL — ABNORMAL HIGH (ref 70–99)
Glucose-Capillary: 96 mg/dL (ref 70–99)
Glucose-Capillary: 134 mg/dL — ABNORMAL HIGH (ref 70–99)

## 2021-07-18 LAB — MRSA NEXT GEN BY PCR, NASAL: MRSA by PCR Next Gen: NOT DETECTED

## 2021-07-18 LAB — POTASSIUM: Potassium: 3.6 mmol/L (ref 3.5–5.1)

## 2021-07-18 LAB — COOXEMETRY PANEL
Carboxyhemoglobin: 1 % (ref 0.5–1.5)
Methemoglobin: 0.8 % (ref 0.0–1.5)
O2 Saturation: 52.5 %
Total hemoglobin: 12.1 g/dL (ref 12.0–16.0)

## 2021-07-18 LAB — BASIC METABOLIC PANEL
Anion gap: 7 (ref 5–15)
BUN: 28 mg/dL — ABNORMAL HIGH (ref 6–20)
CO2: 22 mmol/L (ref 22–32)
Calcium: 9.4 mg/dL (ref 8.9–10.3)
Chloride: 111 mmol/L (ref 98–111)
Creatinine, Ser: 1.82 mg/dL — ABNORMAL HIGH (ref 0.61–1.24)
GFR, Estimated: 46 mL/min — ABNORMAL LOW (ref >60)
Glucose, Bld: 91 mg/dL (ref 70–99)
Potassium: 4.1 mmol/L (ref 3.5–5.1)
Sodium: 140 mmol/L (ref 135–145)

## 2021-07-18 LAB — HEPARIN LEVEL (UNFRACTIONATED): Heparin Unfractionated: <0.1 IU/mL — ABNORMAL LOW (ref 0.30–0.70)

## 2021-07-18 LAB — APTT: aPTT: 32 seconds (ref 24–36)

## 2021-07-18 SURGERY — RIGHT HEART CATH
Anesthesia: LOCAL

## 2021-07-18 MED ORDER — HEPARIN (PORCINE) IN NACL 1000-0.9 UT/500ML-% IV SOLN
INTRAVENOUS | Status: DC | PRN
  Administered 2021-07-18: 500 mL

## 2021-07-18 MED ORDER — INSULIN ASPART PROT & ASPART (70-30 MIX) 100 UNIT/ML PEN: 38.0000 [IU] | PEN_INJECTOR | Freq: Two times a day (BID) | SUBCUTANEOUS | Status: DC

## 2021-07-18 MED ORDER — ACETAMINOPHEN 325 MG PO TABS
650.0000 mg | ORAL_TABLET | ORAL | Status: DC | PRN
  Administered 2021-07-18 – 2021-07-20 (×7): 650 mg via ORAL
  Filled 2021-07-18 (×7): qty 2

## 2021-07-18 MED ORDER — ASPIRIN 81 MG PO CHEW: 81.0000 mg | CHEWABLE_TABLET | ORAL | Status: DC

## 2021-07-18 MED ORDER — ACETAMINOPHEN 325 MG PO TABS: 650.0000 mg | ORAL_TABLET | Freq: Four times a day (QID) | ORAL | Status: DC | PRN

## 2021-07-18 MED ORDER — HEPARIN (PORCINE) IN NACL 1000-0.9 UT/500ML-% IV SOLN
INTRAVENOUS | Status: AC
  Filled 2021-07-18: qty 1000

## 2021-07-18 MED ORDER — SODIUM CHLORIDE 0.9% FLUSH
3.0000 mL | Freq: Two times a day (BID) | INTRAVENOUS | Status: DC
  Administered 2021-07-18 – 2021-07-20 (×5): 3 mL via INTRAVENOUS

## 2021-07-18 MED ORDER — POTASSIUM CHLORIDE CRYS ER 20 MEQ PO TBCR
40.0000 meq | EXTENDED_RELEASE_TABLET | Freq: Once | ORAL | Status: AC
  Administered 2021-07-18: 40 meq via ORAL
  Filled 2021-07-18: qty 2

## 2021-07-18 MED ORDER — ENOXAPARIN SODIUM 30 MG/0.3ML IJ SOSY: 30.0000 mg | PREFILLED_SYRINGE | Freq: Two times a day (BID) | INTRAMUSCULAR | Status: DC

## 2021-07-18 MED ORDER — ONDANSETRON HCL 4 MG/2ML IJ SOLN: 4.0000 mg | Freq: Four times a day (QID) | INTRAMUSCULAR | Status: DC | PRN

## 2021-07-18 MED ORDER — AMIODARONE HCL IN DEXTROSE 360-4.14 MG/200ML-% IV SOLN
30.0000 mg/h | INTRAVENOUS | Status: DC
  Administered 2021-07-18 – 2021-07-21 (×5): 30 mg/h via INTRAVENOUS
  Filled 2021-07-18 (×6): qty 200

## 2021-07-18 MED ORDER — INSULIN ASPART 100 UNIT/ML IJ SOLN
0.0000 [IU] | Freq: Three times a day (TID) | INTRAMUSCULAR | Status: DC
  Administered 2021-07-19: 3 [IU] via SUBCUTANEOUS
  Administered 2021-07-19: 4 [IU] via SUBCUTANEOUS
  Administered 2021-07-20: 3 [IU] via SUBCUTANEOUS
  Administered 2021-07-20: 4 [IU] via SUBCUTANEOUS

## 2021-07-18 MED ORDER — MILRINONE LACTATE IN DEXTROSE 20-5 MG/100ML-% IV SOLN
0.1250 ug/kg/min | INTRAVENOUS | Status: DC
  Administered 2021-07-18 – 2021-07-27 (×23): 0.25 ug/kg/min via INTRAVENOUS
  Administered 2021-07-28 – 2021-07-29 (×3): 0.125 ug/kg/min via INTRAVENOUS
  Filled 2021-07-18 (×25): qty 100

## 2021-07-18 MED ORDER — FENTANYL CITRATE (PF) 100 MCG/2ML IJ SOLN
INTRAMUSCULAR | Status: AC
  Filled 2021-07-18: qty 2

## 2021-07-18 MED ORDER — LIDOCAINE HCL (PF) 1 % IJ SOLN
INTRAMUSCULAR | Status: DC | PRN
  Administered 2021-07-18: 10 mL via SUBCUTANEOUS

## 2021-07-18 MED ORDER — FENTANYL CITRATE (PF) 100 MCG/2ML IJ SOLN
INTRAMUSCULAR | Status: DC | PRN
  Administered 2021-07-18: 25 ug via INTRAVENOUS

## 2021-07-18 MED ORDER — MIDAZOLAM HCL 2 MG/2ML IJ SOLN
INTRAMUSCULAR | Status: DC | PRN
  Administered 2021-07-18 (×2): 1 mg via INTRAVENOUS

## 2021-07-18 MED ORDER — TRAZODONE HCL 50 MG PO TABS
100.0000 mg | ORAL_TABLET | Freq: Every day | ORAL | Status: DC
  Administered 2021-07-18 – 2021-07-20 (×3): 100 mg via ORAL
  Filled 2021-07-18 (×4): qty 2

## 2021-07-18 MED ORDER — SODIUM CHLORIDE 0.9 % IV SOLN: INTRAVENOUS | Status: DC

## 2021-07-18 MED ORDER — EMPAGLIFLOZIN 10 MG PO TABS
10.0000 mg | ORAL_TABLET | Freq: Every day | ORAL | Status: DC
  Administered 2021-07-19 – 2021-07-20 (×2): 10 mg via ORAL
  Filled 2021-07-18 (×3): qty 1

## 2021-07-18 MED ORDER — SODIUM CHLORIDE 0.9% FLUSH: 3.0000 mL | INTRAVENOUS | Status: DC | PRN

## 2021-07-18 MED ORDER — SODIUM CHLORIDE 0.9% IV SOLUTION: INTRAVENOUS | Status: DC | PRN

## 2021-07-18 MED ORDER — SODIUM CHLORIDE 0.9 % IV SOLN: 250.0000 mL | INTRAVENOUS | Status: DC | PRN

## 2021-07-18 MED ORDER — SPIRONOLACTONE 25 MG PO TABS
25.0000 mg | ORAL_TABLET | Freq: Every day | ORAL | Status: DC
  Administered 2021-07-19 – 2021-07-20 (×2): 25 mg via ORAL
  Filled 2021-07-18 (×2): qty 1

## 2021-07-18 MED ORDER — ALLOPURINOL 100 MG PO TABS
100.0000 mg | ORAL_TABLET | Freq: Every day | ORAL | Status: DC
  Administered 2021-07-19 – 2021-07-20 (×2): 100 mg via ORAL
  Filled 2021-07-18 (×2): qty 1

## 2021-07-18 MED ORDER — LIDOCAINE HCL (PF) 1 % IJ SOLN
INTRAMUSCULAR | Status: AC
  Filled 2021-07-18: qty 30

## 2021-07-18 MED ORDER — ORAL CARE MOUTH RINSE
15.0000 mL | Freq: Two times a day (BID) | OROMUCOSAL | Status: DC
  Administered 2021-07-19 – 2021-07-20 (×4): 15 mL via OROMUCOSAL

## 2021-07-18 MED ORDER — AMIODARONE HCL IN DEXTROSE 360-4.14 MG/200ML-% IV SOLN
60.0000 mg/h | INTRAVENOUS | Status: AC
  Administered 2021-07-18: 60 mg/h via INTRAVENOUS
  Filled 2021-07-18: qty 200

## 2021-07-18 MED ORDER — HEPARIN (PORCINE) 25000 UT/250ML-% IV SOLN
1950.0000 [IU]/h | INTRAVENOUS | Status: DC
  Administered 2021-07-18: 1800 [IU]/h via INTRAVENOUS
  Administered 2021-07-19 (×2): 2050 [IU]/h via INTRAVENOUS
  Administered 2021-07-20 (×2): 1950 [IU]/h via INTRAVENOUS
  Filled 2021-07-18 (×5): qty 250

## 2021-07-18 MED ORDER — DOCUSATE SODIUM 100 MG PO CAPS
100.0000 mg | ORAL_CAPSULE | Freq: Two times a day (BID) | ORAL | Status: DC
  Administered 2021-07-18 – 2021-07-20 (×5): 100 mg via ORAL
  Filled 2021-07-18 (×6): qty 1

## 2021-07-18 MED ORDER — SERTRALINE HCL 25 MG PO TABS
25.0000 mg | ORAL_TABLET | Freq: Every day | ORAL | Status: DC
  Administered 2021-07-19 – 2021-08-08 (×19): 25 mg via ORAL
  Filled 2021-07-18 (×19): qty 1

## 2021-07-18 MED ORDER — FUROSEMIDE 10 MG/ML IJ SOLN
80.0000 mg | Freq: Two times a day (BID) | INTRAMUSCULAR | Status: DC
  Administered 2021-07-18 – 2021-07-20 (×6): 80 mg via INTRAVENOUS
  Filled 2021-07-18 (×6): qty 8

## 2021-07-18 MED ORDER — SODIUM CHLORIDE 0.9% FLUSH
3.0000 mL | Freq: Two times a day (BID) | INTRAVENOUS | Status: DC
  Administered 2021-07-19 – 2021-07-20 (×2): 3 mL via INTRAVENOUS

## 2021-07-18 MED ORDER — SODIUM CHLORIDE 0.9 % IV SOLN
INTRAVENOUS | Status: AC | PRN
  Administered 2021-07-18: 10 mL/h via INTRAVENOUS

## 2021-07-18 MED ORDER — ENOXAPARIN SODIUM 40 MG/0.4ML IJ SOSY: 40.0000 mg | PREFILLED_SYRINGE | INTRAMUSCULAR | Status: DC

## 2021-07-18 MED ORDER — LOSARTAN POTASSIUM 25 MG PO TABS
12.5000 mg | ORAL_TABLET | Freq: Every day | ORAL | Status: DC
  Administered 2021-07-19 – 2021-07-20 (×2): 12.5 mg via ORAL
  Filled 2021-07-18 (×2): qty 1

## 2021-07-18 MED ORDER — SODIUM CHLORIDE 0.9% IV SOLUTION: INTRAVENOUS | Status: DC

## 2021-07-18 MED ORDER — POLYETHYLENE GLYCOL 3350 17 G PO PACK
17.0000 g | PACK | Freq: Every day | ORAL | Status: DC
  Administered 2021-07-20: 17 g via ORAL
  Filled 2021-07-18 (×2): qty 1

## 2021-07-18 MED ORDER — CHLORHEXIDINE GLUCONATE CLOTH 2 % EX PADS
6.0000 | MEDICATED_PAD | Freq: Every day | CUTANEOUS | Status: DC
  Administered 2021-07-18 – 2021-07-20 (×3): 6 via TOPICAL

## 2021-07-18 MED ORDER — AMIODARONE HCL 100 MG PO TABS: 100.0000 mg | ORAL_TABLET | Freq: Every day | ORAL | Status: DC

## 2021-07-18 MED ORDER — SODIUM CHLORIDE 0.9% FLUSH
3.0000 mL | Freq: Two times a day (BID) | INTRAVENOUS | Status: DC
  Administered 2021-07-19: 3 mL via INTRAVENOUS

## 2021-07-18 MED ORDER — MIDAZOLAM HCL 2 MG/2ML IJ SOLN
INTRAMUSCULAR | Status: AC
  Filled 2021-07-18: qty 2

## 2021-07-18 MED ORDER — INSULIN ASPART PROT & ASPART (70-30 MIX) 100 UNIT/ML ~~LOC~~ SUSP
38.0000 [IU] | Freq: Two times a day (BID) | SUBCUTANEOUS | Status: DC
  Administered 2021-07-19 – 2021-07-20 (×4): 38 [IU] via SUBCUTANEOUS
  Filled 2021-07-18: qty 10

## 2021-07-18 MED ORDER — PANTOPRAZOLE SODIUM 40 MG PO TBEC
40.0000 mg | DELAYED_RELEASE_TABLET | Freq: Every day | ORAL | Status: DC
  Administered 2021-07-18 – 2021-07-20 (×3): 40 mg via ORAL
  Filled 2021-07-18 (×3): qty 1

## 2021-07-18 SURGICAL SUPPLY — 7 items
CATH SWAN GANZ VIP 7.5F (CATHETERS) ×1 IMPLANT
ELECT DEFIB PAD ADLT CADENCE (PAD) ×1 IMPLANT
PACK CARDIAC CATHETERIZATION (CUSTOM PROCEDURE TRAY) ×2 IMPLANT
SHEATH PINNACLE 8F 10CM (SHEATH) ×1 IMPLANT
SHEATH PROBE COVER 6X72 (BAG) ×1 IMPLANT
SLEEVE REPOSITIONING LENGTH 30 (MISCELLANEOUS) ×1 IMPLANT
TRANSDUCER W/STOPCOCK (MISCELLANEOUS) ×2 IMPLANT

## 2021-07-18 NOTE — Progress Notes (Signed)
Day of Surgery Procedure(s) (LRB): RIGHT HEART CATH (N/A) Subjective: Feels ok  Objective: Vital signs in last 24 hours: Temp:  [97 F (36.1 C)-98.3 F (36.8 C)] 97.9 F (36.6 C) (10/14 1800) Pulse Rate:  [64-172] 64 (10/14 1800) Cardiac Rhythm: Normal sinus rhythm (10/14 1600) Resp:  [10-35] 22 (10/14 1800) BP: (97-135)/(68-100) 113/78 (10/14 1800) SpO2:  [87 %-98 %] 95 % (10/14 1800) Weight:  [131.5 kg] 131.5 kg (10/14 0554)  Hemodynamic parameters for last 24 hours: PAP: (41-61)/(23-38) 60/35 CVP:  [4 mmHg-12 mmHg] 12 mmHg CO:  [3.6 L/min-5.2 L/min] 5.2 L/min CI:  [1.5 L/min/m2-2.2 L/min/m2] 2.2 L/min/m2  Intake/Output from previous day: No intake/output data recorded. Intake/Output this shift: Total I/O In: 1486 [P.O.:800; I.V.:686] Out: 4350 [Urine:4350]  Neurologic: intact Heart: regular rate and rhythm, S1, S2 normal, no murmur Lungs: clear to auscultation bilaterally Extremities: no edema  Lab Results: Recent Labs    07/18/21 0815 07/18/21 1006  WBC  --  5.1  HGB 12.9* 12.0*  HCT 38.0* 38.1*  PLT  --  321   BMET:  Recent Labs    07/18/21 0815 07/18/21 1006 07/18/21 1645  NA 147* 140  --   K 3.6 4.1 3.6  CL  --  111  --   CO2  --  22  --   GLUCOSE  --  91  --   BUN  --  28*  --   CREATININE  --  1.82*  --   CALCIUM  --  9.4  --     PT/INR: No results for input(s): LABPROT, INR in the last 72 hours. ABG    Component Value Date/Time   PHART 7.424 05/13/2021 1322   HCO3 20.4 07/18/2021 0815   TCO2 22 07/18/2021 0815   ACIDBASEDEF 5.0 (H) 07/18/2021 0815   O2SAT 52.5 07/18/2021 1006   CBG (last 3)  Recent Labs    07/18/21 0542 07/18/21 1201 07/18/21 1633  GLUCAP 102* 116* 96   RHC today:  Ao = 118/83 (95) RA = 15 RV = 69/20 PA = 72/35 (48)  PCW = 25 Fick cardiac output/index = 4.6/1.9 Thermo CO/CI = 6.2/2.6  PVR = 5.0 (Fick) 3.7 (Thermo) FA sat = 91% PA sat = 50%, 50% PAPi = 2.5   Assessment/Plan:  Acute on chronic  systolic CHF due to NICM with EF<20%.  Elevated filling pressures on RHC with low CO and Co-ox today. Started on Milrinone and diuresing for optimization preop. Plan HM 3 LVAD insertion Monday. He has no further questions at this time.  LOS: 0 days    Jeffrey [name] 07/18/2021

## 2021-07-18 NOTE — Progress Notes (Signed)
This chaplain responded to a spiritual care referral for notarizing the Pt. Advance Directive:  HCPOA and Living Will.    The Pt. named Eduardo Osier as his healthcare agent.  If the healthcare agent is unable or unwilling to serve the Pt. names [name] as his next choice.  The chaplain gave the Pt. the original AD and two copies to the Pt.  The documents were placed on the bedside table.  A copy of the Pt. AD was scanned into the Pt. EMR.  This chaplain is available for F/U spiritual care as needed.  Chaplain Sallyanne Kuster [phone]

## 2021-07-18 NOTE — H&P (Signed)
Advanced Heart Failure Clinic Note   Primary Care Provider:  Teena Dunk, NP Primary Cardiologist:  Dr. [name] Primary HF Cardiologist: Dr. [name]  History: Jeffrey [name] is a 45 y.o. male with a history of  poorly controlled HTN, R renal cell carcinoma s/p nephrectomy, CKD IIIa, DM2, OSA, gout, morbid obesity and systolic HF due to NICM.   Echo in 2012 with EF 20-25%.  In 1/13 had nuclear study EF 45-50% suggestive of NICM.  Cath 8/16 showed normal coronaries and moderately elevated filling pressures with moderately depressed cardiac output. [place] s- ICD implanted.  Admitted 9/18 with ADHF in setting of new afib. Started on amiodarone. Pt diuresed and converted to NSR without requiring DCCV. Started on Eliquis for [facility].  Admitted 3/19 with ICD shock. Electrolytes stable. Echo 12/31/17 with LVEF 25-30%.   Pt admitted 8/21 for S-ICD generator change. Course was complicated with acute hypoxic/hypercapnic respiratory failure due to possible flash pulmonary edema. Had traumatic intubation resulting in aspiration and requiring ventilator support and short term pressor support.  Admitted 8/56 with A/C systolic CHF/cardiogenic shock and AF with RVR. He was placed on amio gtt and underwent cardioversion. Had recurrent AF and underwent cardioversion again. Echo showed EF < 20%, severe LV dilation, moderate RV dysfunction. Required NE and dobutamine, which were eventually weaned off. Hospitalization c/b by acute respiratory failure with hypoxia requiring mechanical ventilation. GDMT was limited by hypotension and VAD workup initiated. Underwent RHC, for VAD work up, which showed well-compensated hemodynamics. Creatinine peaked at 4.65 improved to 1.58 which is his baseline. He was discharged to CIR, weight 299 lbs.  Has been presented at [facility] and discussed with [facility] transplant team and he was approved for DT VAD to be placed 07/21/21  Has been relatively stable as outpatient  with NYHA 3B symptoms. Underwent RHC today with swan placement for pre-VAD optimization.   Ao = 118/83 (95) RA = 15 RV = 69/20 PA = 72/35 (48)  PCW = 25 Fick cardiac output/index = 4.6/1.9 Thermo CO/CI = 6.2/2.6  PVR = 5.0 (Fick) 3.7 (Thermo) FA sat = 91% PA sat = 50%, 50% PAPi = 2.5    Cardiac Studies: - Echo (8/22): EF <20%, severe LV dysfunction, grade III DD, RV moderately reduced.  - RHC (8/22): Well-compensated hemodynamic   RA = 6 RV = 29/3 PA = 35/12 (22) PCW = 16 Fick cardiac output/index = 5.5/2.3 PVR = 1.1 WU Ao sat = 98% PA sat = 65%, 67%  Review of systems complete and found to be negative unless listed in HPI.    Past Medical History:  Diagnosis Date   Anxiety    Aspirin allergy    Childhood asthma    Chronic systolic CHF (congestive heart failure) ([place])    a. EF 20-25% in 2012. b. EF 45-50% in 10/2011 with nonischemic nuc - presumed NICM. c. 12/2014 Echo: Sev depressed LV fxn, sev dil LV, mild LVH, mild MR, sev dil LA, mildly reduced RV fxn.   CKD (chronic kidney disease) stage 2, GFR 60-89 ml/min    H/O vasectomy 12/2019   High cholesterol    Hypertension    Morbid obesity ([place])    Nephrolithiasis    OSA on CPAP    Paroxysmal atrial fibrillation ([name])    Presumed NICM    a. 04/2014 Myoview: EF 26%, glob HK, sev glob HK, ? prior infarct;  b. Never cathed 2/2 CKD.   Renal cell carcinoma ([place])  a. s/p Rt robotic assisted partial converted to radical nephrectomy on 01/2013.   Troponin level elevated    a. 04/2014, 12/2014: felt due to CHF.   Type II diabetes mellitus (HCC)    Ventricular tachycardia    a. appropriate ICD therapy 12/2017   Past Surgical History:  Procedure Laterality Date   APPENDECTOMY  07/2004   BIOPSY  02/10/2021   Procedure: BIOPSY;  Surgeon: Gatha Mayer, MD;  Location: [facility] ENDOSCOPY;  Service: Endoscopy;;   CARDIAC CATHETERIZATION N/A 05/17/2015   Procedure: Right/Left Heart Cath and Coronary Angiography;  Surgeon: Jolaine Artist, MD;  Location: [place] CV LAB;  Service: Cardiovascular;  Laterality: N/A;   CARDIOVERSION N/A 04/30/2021   Procedure: CARDIOVERSION;  Surgeon: Larey Dresser, MD;  Location: [facility] ENDOSCOPY;  Service: Cardiovascular;  Laterality: N/A;   COLONOSCOPY WITH PROPOFOL N/A 02/10/2021   Procedure: COLONOSCOPY WITH PROPOFOL;  Surgeon: Gatha Mayer, MD;  Location: [facility] ENDOSCOPY;  Service: Endoscopy;  Laterality: N/A;   EP IMPLANTABLE DEVICE N/A 05/17/2015   Procedure: SubQ ICD Implant;  Surgeon: Will Meredith Leeds, MD;  Location: [place] CV LAB;  Service: Cardiovascular;  Laterality: N/A;   ESOPHAGOGASTRODUODENOSCOPY (EGD) WITH PROPOFOL N/A 02/10/2021   Procedure: ESOPHAGOGASTRODUODENOSCOPY (EGD) WITH PROPOFOL;  Surgeon: Gatha Mayer, MD;  Location: [facility];  Service: Endoscopy;  Laterality: N/A;   RIGHT HEART CATH N/A 05/13/2021   Procedure: RIGHT HEART CATH;  Surgeon: Jolaine Artist, MD;  Location: [place] CV LAB;  Service: Cardiovascular;  Laterality: N/A;   ROBOTIC ASSISTED LAPAROSCOPIC LYSIS OF ADHESION  01/13/2013   Procedure: ROBOTIC ASSISTED LAPAROSCOPIC LYSIS OF ADHESION EXTENSIVE;  Surgeon: Alexis Frock, MD;  Location: [facility] ORS;  Service: Urology;;   ROBOTIC ASSITED PARTIAL NEPHRECTOMY Right 01/13/2013   Procedure: ROBOTIC ASSITED PARTIAL NEPHRECTOMY CONVERTED TO ROBOTIC ASSISTED RIGHT RADICAL NEPHRECTOMY;  Surgeon: Alexis Frock, MD;  Location: [facility] ORS;  Service: Urology;  Laterality: Right;   SUBQ ICD CHANGEOUT N/A 05/16/2020   Procedure: SUBQ ICD CHANGEOUT;  Surgeon: [name], MD;  Location: [place] CV LAB;  Service: Cardiovascular;  Laterality: N/A;   SUBQ ICD CHANGEOUT N/A 05/15/2020   Procedure: SUBQ ICD CHANGEOUT;  Surgeon: [name], MD;  Location: [place] CV LAB;  Service: Cardiovascular;  Laterality: N/A;   TEE WITHOUT CARDIOVERSION N/A 04/30/2021   Procedure: TRANSESOPHAGEAL ECHOCARDIOGRAM (TEE);  Surgeon: Larey Dresser, MD;   Location: [facility] ENDOSCOPY;  Service: Cardiovascular;  Laterality: N/A;   VASECTOMY     Allergies  Allergies  Allergen Reactions   Aspirin Shortness Of Breath, Itching and Rash     Burning sensation (Patient reports he tolerates other NSAIDS)    Bee Venom Hives and Swelling   Lisinopril Cough   Shrimp (Diagnostic) Rash   Tomato Rash   Home Medications Current Facility-Administered Medications  Medication Dose Route Frequency Provider Last Rate Last Admin   0.9 %  sodium chloride infusion  250 mL Intravenous PRN Seren Chaloux, Shaune Pascal, MD       0.9 %  sodium chloride infusion   Intravenous Continuous Purity Irmen, Shaune Pascal, MD 10 mL/hr at 07/18/21 0556 New Bag at 07/18/21 0556   fentaNYL (SUBLIMAZE) injection    PRN Gearlene Godsil, Shaune Pascal, MD   25 mcg at 07/18/21 0756   lidocaine (PF) (XYLOCAINE) 1 % injection    PRN Kimie Pidcock, Shaune Pascal, MD   10 mL at 07/18/21 0802   midazolam (VERSED) injection    PRN Daja Shuping, Shaune Pascal, MD  1 mg at 07/18/21 0803   sodium chloride flush (NS) 0.9 % injection 3 mL  3 mL Intravenous Q12H Makenah Karas, Shaune Pascal, MD       sodium chloride flush (NS) 0.9 % injection 3 mL  3 mL Intravenous PRN Aslin Farinas, Shaune Pascal, MD       BP 114/83   Pulse 73   Temp 98.3 F (36.8 C) (Oral)   Resp 10   Ht 5\' 7"  (1.702 m)   Wt 131.5 kg   SpO2 91%   BMI 45.42 kg/m    Wt Readings from Last 3 Encounters:  07/18/21 131.5 kg  07/15/21 136.1 kg  07/14/21 134.4 kg   Physical Exam General:  Well appearing. No resp difficulty HEENT: normal Neck: supple. JVP up Carotids 2+ bilat; no bruits. No lymphadenopathy or thryomegaly appreciated. Cor: PMI laterally displaced. Regular rate & rhythm. No rubs, gallops or murmurs. Lungs: clear Abdomen: obese soft, nontender, nondistended. No hepatosplenomegaly. No bruits or masses. Good bowel sounds. Extremities: no cyanosis, clubbing, rash, edema Neuro: alert & orientedx3, cranial nerves grossly intact. moves all 4 extremities w/o difficulty.  Affect pleasant  Assessment & Plan  1. Acute on Chronic systolic CHF - Nonischemic CM, end-stage - Echo (9/21): EF 20-25%.   - Echo  8/22: EF < 20%, severe LV dilation, moderate RV dysfunction.   - RHC (8/22): well compensated hemodynamics. - s/p S-ICD - NYHA III-IIIB  - Continue losartan - Continue spiro 25 mg daily - Continue Jardiance 10 mg daily - No BB d/t recent shock. - Body mass index is 45.42 kg/m. Not transplant candidate - RHC today with elevated filling pressures and low CO. Start milrinone for optimization. Diurese with IV lasix - Watch renal function  - Plan for DT VAD on 10/17   2 Atrial fibrillation: Paroxysmal - Emergent TEE-guided DCCV 7/27 due to inability to control HR. - Recurrent AF on 8/5. s/p DC-CV on 05/10/21.  - Seen by EP. Not a candidate for ablation due to body habitus  (could consider AVN ablation and CRT, if needed)  - In NSR today - Start IV amio to prevent recurrent VT on milrinone   3. CKD stage 3B  - Has solitary kidney.   - Baseline creatinine is 1.6-2.9.  - Avoid hypotension. - Hopefully will improve with milrinone support   4. OSA - Continue CPAP.    5. DM2. Poorly controlled - On Jardiance. - Hgb A1c 9.4. - Cover with SSI   6. Hematuria - MRI and cystoscopy ok    Glori Bickers, MD  8:42 AM  07/18/21 8:42 AM

## 2021-07-18 NOTE — Progress Notes (Signed)
CSW met with patient at bedside to discuss upcoming VAD implant. Patient states he is ready for surgery and states he is looking forward to getting it underway and start his recovery. Patient states he is motivated for improved health. CSW completed PHQ9 and patient scored a 12. CSW discussed depressive symptoms and provided supportive counseling at bedside. Patient appears to be appropriate given upcoming surgery and current health status. Patient shared he is in process of completing the Advanced Directive and waiting for notary. CSW will continue to follow along with inpatient HF CSW and provide supportive counseling throughout implant hospitalization. Raquel Sarna, [place], [place]

## 2021-07-18 NOTE — Progress Notes (Signed)
[place] visited Jeffrey [name]. per [facility] consult for assistance completing advance directive.  Jeffrey [name]. lying in bed w/wife Kamika at bedside.  Jeffrey [name]. confirms he would like to complete AD; [place] brought him the document and briefly explained its contents.  Jeffrey [name]. and wife shared they had document at home from prior admission to [facility] but had not completed it; Jeffrey [name]. and wife would like Jeffrey [name]. to complete document before procedure to place LVAD on Monday.  [place] made referral to Harrie Foreman for potential follow-up this afternoon and also discussed the option of having an outside notary come assist w/notarization this weekend if notarization is not possible via hospital staff.  Chaplains will follow up if possible today.  Lindaann Pascal, Chaplain Pager: [phone]

## 2021-07-18 NOTE — Plan of Care (Signed)
?  Problem: Nutrition: ?Goal: Adequate nutrition will be maintained ?Outcome: Progressing ?  ?Problem: Coping: ?Goal: Level of anxiety will decrease ?Outcome: Progressing ?  ?Problem: Elimination: ?Goal: Will not experience complications related to urinary retention ?Outcome: Progressing ?  ?

## 2021-07-18 NOTE — Progress Notes (Signed)
Mr. Gorniak has been discussed with the VAD Medical Review board on June 23, 2021. The team feels as if the patient is a good candidate for Destination LVAD therapy. The patient meets criteria for a LVAD implant as listed below:  1) NYHA Class: __IV____ documented on _____10/14/22_____(date)  2) Has a left ventricular ejection fraction (LVEF) < 25%   *EF__<20%____ by echo (date) __7/27/22___  3) Must meet one of the following:   Is inotrope dependent   *On inotropes__Milrinone 0.25 mcg/kg/min___started___10/14/22_____  OR  Has a Cardiac Index (CI) < 2.2  L/min/m2 while not on inotropes:   *CI:__1.9____--07/18/21 prior to starting Milrinone   4) Must meet one of the following:   ___X___ Is on optimal medical management (OMM), based on current heart failure practice guidelines for at least 68 of the last 60 days and are failing to respond   ______ Has advanced heart failure for at least 14 days and are dependent on an intra-aortic balloon pump (IABP) or similar temporary mechanical circulatory support for at least 7 days      ____ IABP inserted (date) ____      ____ Impella inserted (date) ____  5)  Social work and palliative care evaluations demonstrate appropriate support system in place for discharge to home with a VAD and that end of life discussions have taken place. Both services have expressed no concern regarding patient's candidacy.         *Social work consult (date): 05/14/21 - [place] (date): 10/23/12 - Vinie Sill  6)  Primary caretaker identified that can be taught along with the patient how to manage        the VAD equipment.        *Name: Bynum Mccullars - spouse  37)  Deemed appropriate by our financial coordinator: 07/17/21        Prior approval:   8)  VAD Coordinator, Ebony Hail has met with patient and caregiver, shown them the VAD equipment and discussed with the patient and caregiver about lifestyle changes necessary  for success on mechanical circulatory device.        *Met with Mr. Paskett and his wife  on 05/12/21.       *Consent for VAD Evaluation/Caregiver Agreement/HIPPA Release/Photo Release signed on 05/12/21   9)  [place]:  too sick   10)  Intermacs profile: 3  INTERMACS 1: Critical cardiogenic shock describes a patient who is "crashing and burning", in which a patient has life-threatening hypotension and rapidly escalating inotropic pressor support, with critical organ hypoperfusion often confirmed by worsening acidosis and lactate levels.  INTERMACS 2: Progressive decline describes a patient who has been demonstrated "dependent" on inotropic support but nonetheless shows signs of continuing deterioration in nutrition, renal function, fluid retention, or other major status indicator. Patient profile 2 can also describe a patient with refractory volume overload, perhaps with evidence of impaired perfusion, in whom inotropic infusions cannot be maintained due to tachyarrhythmias, clinical ischemia, or other intolerance.  INTERMACS 3: Stable but inotrope dependent describes a patient who is clinically stable on mild-moderate doses of intravenous inotropes (or has a temporary circulatory support device) after repeated documentation of failure to wean without symptomatic hypotension, worsening symptoms, or progressive organ dysfunction (usually renal). It is critical to monitor nutrition, renal function, fluid balance, and overall status carefully in order to distinguish between a   patient who is truly stable at Patient Profile 3 and a patient  who has unappreciated decline rendering them Patient Profile 2. This patient may be either at home or in the hospital.      INTERMACS 4: Resting symptoms describes a patient who is at home on oral therapy but frequently has symptoms of congestion at rest or with activities of daily living (ADL). He or she may have orthopnea, shortness of breath during ADL such  as dressing or bathing, gastrointestinal symptoms (abdominal discomfort, nausea, poor appetite), disabling ascites or severe lower extremity edema. This patient should be carefully considered for more intensive management and surveillance programs, which may in some cases, reveal poor compliance that would compromise outcomes with any therapy.   .   INTERMACS 5: Exertion Intolerant describes a patient who is comfortable at rest but unable to engage in any activity, living predominantly within the house or housebound. This patient has no congestive symptoms, but may have chronically elevated volume status, frequently with renal dysfunction, and may be characterized as exercise intolerant.      INTERMACS 6: Exertion Limited also describes a patient who is comfortable at rest without evidence of fluid overload, but who is able to do some mild activity. Activities of daily living are comfortable and minor activities outside the home such as visiting friends or going to a restaurant can be performed, but fatigue results within a few minutes of any meaningful physical exertion. This patient has occasional episodes of worsening symptoms and is likely to have had a hospitalization for heart failure within the past year.   INTERMACS 7: Advanced NYHA Class 3 describes a patient who is clinically stable with a reasonable level of comfortable activity, despite history of previous decompensation that is not recent. This patient is usually able to walk more than a block. Any decompensation requiring intravenous diuretics or hospitalization within the previous month should make this person a Patient Profile 6 or lower.     Tanda Rockers RN, BSN VAD Coordinator 24/7 Pager [phone]

## 2021-07-18 NOTE — Progress Notes (Signed)
ANTICOAGULATION CONSULT NOTE - Initial Consult  Pharmacy Consult for heparin Indication: atrial fibrillation  Allergies  Allergen Reactions   Aspirin Shortness Of Breath, Itching and Rash     Burning sensation (Patient reports he tolerates other NSAIDS)    Bee Venom Hives and Swelling   Lisinopril Cough   Shrimp (Diagnostic) Rash   Tomato Rash    Patient Measurements: Height: 5\' 7"  (170.2 cm) Weight: 131.5 kg (290 lb) IBW/kg (Calculated) : 66.1 Heparin Dosing Weight: 97kg  Vital Signs: Temp: 98.3 F (36.8 C) (10/14 0554) Temp Source: Oral (10/14 0554) BP: 114/83 (10/14 0836) Pulse Rate: 73 (10/14 0836)  Labs: No results for input(s): HGB, HCT, PLT, APTT, LABPROT, INR, HEPARINUNFRC, HEPRLOWMOCWT, CREATININE, CKTOTAL, CKMB, TROPONINIHS in the last 72 hours.  Estimated Creatinine Clearance: 63.1 mL/min (A) (by C-G formula based on SCr of 1.91 mg/dL (H)).   Medical History: Past Medical History:  Diagnosis Date   Anxiety    Aspirin allergy    Childhood asthma    Chronic systolic CHF (congestive heart failure) ([place])    a. EF 20-25% in 2012. b. EF 45-50% in 10/2011 with nonischemic nuc - presumed NICM. c. 12/2014 Echo: Sev depressed LV fxn, sev dil LV, mild LVH, mild MR, sev dil LA, mildly reduced RV fxn.   CKD (chronic kidney disease) stage 2, GFR 60-89 ml/min    H/O vasectomy 12/2019   High cholesterol    Hypertension    Morbid obesity ([place])    Nephrolithiasis    OSA on CPAP    Paroxysmal atrial fibrillation ([place])    Presumed NICM    a. 04/2014 Myoview: EF 26%, glob HK, sev glob HK, ? prior infarct;  b. Never cathed 2/2 CKD.   Renal cell carcinoma ([place])    a. s/p Rt robotic assisted partial converted to radical nephrectomy on 01/2013.   Troponin level elevated    a. 04/2014, 12/2014: felt due to CHF.   Type II diabetes mellitus (HCC)    Ventricular tachycardia    a. appropriate ICD therapy 12/2017   Assessment: 45 year old male with end stage heart failure, admitted  post RHC with low cardiac output. Patient scheduled for LVAD next week. Patient with history of afib on apixaban prior to admission, will bridge with IV heparin until surgery. CBC appears stable from recent outpatient checks. No bleeding or hematoma noted post cath.   Baseline heparin level was undetectable, last dose of apixaban is documented as 10/10. Will be able to use heparin levels for anticoagulation monitoring.   Goal of Therapy:  Heparin level 0.3-0.7 units/ml Monitor platelets by anticoagulation protocol: Yes   Plan:  Start heparin infusion at 1800 units/hr Check anti-Xa level in 6 hours and daily while on heparin Continue to monitor H&H and platelets  Erin Hearing PharmD., BCPS Clinical Pharmacist 07/18/2021 9:41 AM

## 2021-07-18 NOTE — H&P (View-Only) (Signed)
Day of Surgery Procedure(s) (LRB): RIGHT HEART CATH (N/A) Subjective: Feels ok  Objective: Vital signs in last 24 hours: Temp:  [97 F (36.1 C)-98.3 F (36.8 C)] 97.9 F (36.6 C) (10/14 1800) Pulse Rate:  [64-172] 64 (10/14 1800) Cardiac Rhythm: Normal sinus rhythm (10/14 1600) Resp:  [10-35] 22 (10/14 1800) BP: (97-135)/(68-100) 113/78 (10/14 1800) SpO2:  [87 %-98 %] 95 % (10/14 1800) Weight:  [131.5 kg] 131.5 kg (10/14 0554)  Hemodynamic parameters for last 24 hours: PAP: (41-61)/(23-38) 60/35 CVP:  [4 mmHg-12 mmHg] 12 mmHg CO:  [3.6 L/min-5.2 L/min] 5.2 L/min CI:  [1.5 L/min/m2-2.2 L/min/m2] 2.2 L/min/m2  Intake/Output from previous day: No intake/output data recorded. Intake/Output this shift: Total I/O In: 1486 [P.O.:800; I.V.:686] Out: 4350 [Urine:4350]  Neurologic: intact Heart: regular rate and rhythm, S1, S2 normal, no murmur Lungs: clear to auscultation bilaterally Extremities: no edema  Lab Results: Recent Labs    07/18/21 0815 07/18/21 1006  WBC  --  5.1  HGB 12.9* 12.0*  HCT 38.0* 38.1*  PLT  --  321   BMET:  Recent Labs    07/18/21 0815 07/18/21 1006 07/18/21 1645  NA 147* 140  --   K 3.6 4.1 3.6  CL  --  111  --   CO2  --  22  --   GLUCOSE  --  91  --   BUN  --  28*  --   CREATININE  --  1.82*  --   CALCIUM  --  9.4  --     PT/INR: No results for input(s): LABPROT, INR in the last 72 hours. ABG    Component Value Date/Time   PHART 7.424 05/13/2021 1322   HCO3 20.4 07/18/2021 0815   TCO2 22 07/18/2021 0815   ACIDBASEDEF 5.0 (H) 07/18/2021 0815   O2SAT 52.5 07/18/2021 1006   CBG (last 3)  Recent Labs    07/18/21 0542 07/18/21 1201 07/18/21 1633  GLUCAP 102* 116* 96   RHC today:  Ao = 118/83 (95) RA = 15 RV = 69/20 PA = 72/35 (48)  PCW = 25 Fick cardiac output/index = 4.6/1.9 Thermo CO/CI = 6.2/2.6  PVR = 5.0 (Fick) 3.7 (Thermo) FA sat = 91% PA sat = 50%, 50% PAPi = 2.5   Assessment/Plan:  Acute on chronic  systolic CHF due to NICM with EF<20%.  Elevated filling pressures on RHC with low CO and Co-ox today. Started on Milrinone and diuresing for optimization preop. Plan HM 3 LVAD insertion Monday. He has no further questions at this time.  LOS: 0 days    Gaye Pollack 07/18/2021

## 2021-07-18 NOTE — TOC Initial Note (Signed)
Transition of Care [facility]) - Initial/Assessment Note    Patient Details  Name: Jeffrey [name] MRN: [id number] Date of Birth: [birth year]  Transition of Care [facility]) CM/SW Contact:    [place], [place] Phone Number: 07/18/2021, 2:24 PM  Clinical Narrative:                 HF CSW spoke with [name] at bedside to check on him and see how he is feeling about his LVAD surgery and briefly discuss and challenges or concerns. [name] reported that he is feeling well and ready for surgery Monday morning. CSW provided the patient with the social workers name and position and if anything changes to please reach out so that CSW can provide support.                  CSW will continue to follow throughout discharge.    Barriers to Discharge: Continued Medical Work up   Patient Goals and CMS Choice        Expected Discharge Plan and Services   In-house Referral: Clinical Social Work Discharge Planning Services: CM Consult   Living arrangements for the past 2 months: Single Family Home                                      Prior Living Arrangements/Services Living arrangements for the past 2 months: Single Family Home Lives with:: Self, Spouse, Minor Children Patient language and need for interpreter reviewed:: Yes        Need for Family Participation in Patient Care: No (Comment) Care giver support system in place?: No (comment)   Criminal Activity/Legal Involvement Pertinent to Current Situation/Hospitalization: No - Comment as needed  Activities of Daily Living      Permission Sought/Granted                  Emotional Assessment Appearance:: Appears stated age Attitude/Demeanor/Rapport: Engaged Affect (typically observed): Pleasant Orientation: : Oriented to Self, Oriented to Place, Oriented to  Time, Oriented to Situation   Psych Involvement: No (comment)  Admission diagnosis:  Acute on chronic systolic CHF (congestive heart failure) (HCC) [I50.23] Patient  Active Problem List   Diagnosis Date Noted   Acute on chronic systolic CHF (congestive heart failure) ([place]) 07/18/2021   Cardiogenic shock ([place])    Atrial fibrillation with RVR ([place]) 04/30/2021   Acute hypoxemic respiratory failure ([place])    Right wrist pain 04/16/2021   Absolute anemia    Rectal bleeding    Iron deficiency 12/25/2020   Long term current use of anticoagulant - apixaban 12/25/2020   Paroxysmal atrial fibrillation (HCC)    Lactic acidosis 11/22/2020   Hypotension 11/22/2020   Aspiration into airway    ICD (implantable cardioverter-defibrillator) battery depletion 05/15/2020   Endotracheal tube present    Respiratory failure ([place])    Acute encephalopathy    Acute pulmonary edema (HCC)    NICM (nonischemic cardiomyopathy) ([place]) 04/24/2020   S/P vasectomy 12/20/2019   Anxiety 12/20/2019   Hemoglobin A1C between 7% and 9% indicating borderline diabetic control ([place]) 12/20/2019   Seasonal allergies 12/20/2019   Insomnia 12/20/2019   Vasectomy evaluation 06/20/2019   Visual problems 06/20/2019   Blurry vision, bilateral 06/20/2019   Hyperglycemia 02/13/2019   Hyperosmolar (nonketotic) coma ([place]) 01/19/2019   AICD (automatic cardioverter/defibrillator) present 01/19/2019   Hyperlipidemia 12/07/2018   Follow-up exam 11/22/2018   Class 3 severe  obesity due to excess calories with serious comorbidity and body mass index (BMI) of 45.0 to 49.9 in adult [facility]) 11/22/2018   Dizziness 11/22/2018   Lingular pneumonia 08/04/2018   Ventricular tachycardia 12/30/2017   PAF (paroxysmal atrial fibrillation) ([place])    Dilated cardiomyopathy ([place])    Pollen allergy 02/17/2017   Dyslipidemia 02/17/2017   Hematochezia 11/08/2015   CKD (chronic kidney disease), stage III ([place]) 06/20/2015   Cardiac defibrillator in situ    Chronic systolic CHF (congestive heart failure) ([place]) 03/11/2015   Gouty arthritis 03/11/2015   Chest pain 12/28/2014   Acute renal failure superimposed on stage 3  chronic kidney disease ([place]) 04/08/2014   Aspirin allergy 04/08/2014   Renal cell carcinoma ([employer])    Gout attack 11/10/2011   Morbid obesity ([place]) 10/23/2011   OSA (obstructive sleep apnea) 10/23/2011   Type 2 diabetes mellitus with stage 3 chronic kidney disease, with long-term current use of insulin ([place]) 10/22/2011   Hyperlipidemia associated with type 2 diabetes mellitus ([place]) 12/25/2010   Hypertension associated with diabetes ([place]) 12/25/2010   PCP:  Bo Merino I, NP Pharmacy:   [facility] [address] [place] [address] Phone: [phone] Fax: [phone]  [id number] [place], [place] [address] Phone: [phone] Fax: [phone]  [id number] [place], [place] [address] [place] [address] Phone: [phone] Fax: [phone]  [facility] [address] [place] [address] Phone: [phone] Fax: [phone]     Social Determinants of Health (SDOH) Interventions Depression Interventions/Treatment : Counseling  Readmission Risk Interventions No flowsheet data found.  Jayceion Lisenby, MSW, [place] Heart Failure Social Worker

## 2021-07-18 NOTE — Interval H&P Note (Signed)
History and Physical Interval Note:  07/18/2021 7:31 AM  Jeffrey [name]  has presented today for surgery, with the diagnosis of heart failure.  The various methods of treatment have been discussed with the patient and family. After consideration of risks, benefits and other options for treatment, the patient has consented to  Procedure(s): RIGHT HEART CATH (N/A) as a surgical intervention.  The patient's history has been reviewed, patient examined, no change in status, stable for surgery.  I have reviewed the patient's chart and labs.  Questions were answered to the patient's satisfaction.     Dominique Ressel

## 2021-07-19 DIAGNOSIS — I5023 Acute on chronic systolic (congestive) heart failure: Secondary | ICD-10-CM

## 2021-07-19 LAB — CBC
HCT: 38.9 % — ABNORMAL LOW (ref 39.0–52.0)
Hemoglobin: 12.5 g/dL — ABNORMAL LOW (ref 13.0–17.0)
MCH: 25 pg — ABNORMAL LOW (ref 26.0–34.0)
MCHC: 32.1 g/dL (ref 30.0–36.0)
MCV: 77.6 fL — ABNORMAL LOW (ref 80.0–100.0)
Platelets: 300 10*3/uL (ref 150–400)
RBC: 5.01 MIL/uL (ref 4.22–5.81)
RDW: 15.9 % — ABNORMAL HIGH (ref 11.5–15.5)
WBC: 6.4 10*3/uL (ref 4.0–10.5)
nRBC: 0 % (ref 0.0–0.2)

## 2021-07-19 LAB — GLUCOSE, CAPILLARY
Glucose-Capillary: 129 mg/dL — ABNORMAL HIGH (ref 70–99)
Glucose-Capillary: 115 mg/dL — ABNORMAL HIGH (ref 70–99)
Glucose-Capillary: 152 mg/dL — ABNORMAL HIGH (ref 70–99)
Glucose-Capillary: 83 mg/dL (ref 70–99)

## 2021-07-19 LAB — BASIC METABOLIC PANEL
Anion gap: 8 (ref 5–15)
BUN: 25 mg/dL — ABNORMAL HIGH (ref 6–20)
CO2: 24 mmol/L (ref 22–32)
Calcium: 9.2 mg/dL (ref 8.9–10.3)
Chloride: 105 mmol/L (ref 98–111)
Creatinine, Ser: 1.77 mg/dL — ABNORMAL HIGH (ref 0.61–1.24)
GFR, Estimated: 47 mL/min — ABNORMAL LOW (ref >60)
Glucose, Bld: 140 mg/dL — ABNORMAL HIGH (ref 70–99)
Potassium: 3.7 mmol/L (ref 3.5–5.1)
Sodium: 137 mmol/L (ref 135–145)

## 2021-07-19 LAB — COOXEMETRY PANEL
Carboxyhemoglobin: 1.2 % (ref 0.5–1.5)
Methemoglobin: 0.9 % (ref 0.0–1.5)
O2 Saturation: 67.8 %
Total hemoglobin: 12.6 g/dL (ref 12.0–16.0)

## 2021-07-19 LAB — HEPARIN LEVEL (UNFRACTIONATED)
Heparin Unfractionated: 0.2 IU/mL — ABNORMAL LOW (ref 0.30–0.70)
Heparin Unfractionated: 0.52 IU/mL (ref 0.30–0.70)
Heparin Unfractionated: 0.56 IU/mL (ref 0.30–0.70)

## 2021-07-19 LAB — MAGNESIUM: Magnesium: 1.6 mg/dL — ABNORMAL LOW (ref 1.7–2.4)

## 2021-07-19 MED ORDER — POTASSIUM CHLORIDE 2 MEQ/ML IV SOLN
80.0000 meq | INTRAVENOUS | Status: DC
  Filled 2021-07-19: qty 40

## 2021-07-19 MED ORDER — VANCOMYCIN HCL 1500 MG/300ML IV SOLN
1500.0000 mg | INTRAVENOUS | Status: AC
  Administered 2021-07-21: 1500 mg via INTRAVENOUS
  Filled 2021-07-19: qty 300

## 2021-07-19 MED ORDER — POTASSIUM CHLORIDE CRYS ER 20 MEQ PO TBCR
40.0000 meq | EXTENDED_RELEASE_TABLET | Freq: Two times a day (BID) | ORAL | Status: AC
  Administered 2021-07-19 (×2): 40 meq via ORAL
  Filled 2021-07-19 (×2): qty 2

## 2021-07-19 MED ORDER — SODIUM CHLORIDE 0.9 % IV SOLN
600.0000 mg | INTRAVENOUS | Status: AC
  Administered 2021-07-21: 600 mg via INTRAVENOUS
  Filled 2021-07-19: qty 600

## 2021-07-19 MED ORDER — TRANEXAMIC ACID (OHS) PUMP PRIME SOLUTION
2.0000 mg/kg | INTRAVENOUS | Status: DC
  Filled 2021-07-19: qty 2.52

## 2021-07-19 MED ORDER — INSULIN REGULAR(HUMAN) IN NACL 100-0.9 UT/100ML-% IV SOLN
INTRAVENOUS | Status: AC
  Administered 2021-07-21: 5 [IU]/h via INTRAVENOUS
  Filled 2021-07-19: qty 100

## 2021-07-19 MED ORDER — NITROGLYCERIN IN D5W 200-5 MCG/ML-% IV SOLN
0.0000 ug/min | INTRAVENOUS | Status: DC
  Filled 2021-07-19: qty 250

## 2021-07-19 MED ORDER — DOPAMINE-DEXTROSE 3.2-5 MG/ML-% IV SOLN
0.0000 ug/kg/min | INTRAVENOUS | Status: DC
  Filled 2021-07-19: qty 250

## 2021-07-19 MED ORDER — TRANEXAMIC ACID 1000 MG/10ML IV SOLN
1.5000 mg/kg/h | INTRAVENOUS | Status: AC
  Administered 2021-07-21 (×2): 1.5 mg/kg/h via INTRAVENOUS
  Filled 2021-07-19 (×2): qty 25

## 2021-07-19 MED ORDER — MILRINONE LACTATE IN DEXTROSE 20-5 MG/100ML-% IV SOLN
0.3000 ug/kg/min | INTRAVENOUS | Status: DC
  Filled 2021-07-19 (×2): qty 100

## 2021-07-19 MED ORDER — PHENYLEPHRINE HCL-NACL 20-0.9 MG/250ML-% IV SOLN
0.0000 ug/min | INTRAVENOUS | Status: DC
  Filled 2021-07-19: qty 250

## 2021-07-19 MED ORDER — TRANEXAMIC ACID (OHS) BOLUS VIA INFUSION
15.0000 mg/kg | INTRAVENOUS | Status: AC
  Administered 2021-07-21: 1891.5 mg via INTRAVENOUS
  Filled 2021-07-19: qty 1892

## 2021-07-19 MED ORDER — MAGNESIUM SULFATE 50 % IJ SOLN
40.0000 meq | INTRAMUSCULAR | Status: DC
  Filled 2021-07-19: qty 9.85

## 2021-07-19 MED ORDER — HEPARIN 30,000 UNITS/1000 ML (OHS) CELLSAVER SOLUTION
Status: DC
  Filled 2021-07-19: qty 1000

## 2021-07-19 MED ORDER — DOBUTAMINE IN D5W 4-5 MG/ML-% IV SOLN
2.0000 ug/kg/min | INTRAVENOUS | Status: DC
  Filled 2021-07-19: qty 250

## 2021-07-19 MED ORDER — CEFAZOLIN IN SODIUM CHLORIDE 3-0.9 GM/100ML-% IV SOLN
3.0000 g | INTRAVENOUS | Status: AC
  Administered 2021-07-21: 3 g via INTRAVENOUS
  Filled 2021-07-19: qty 100

## 2021-07-19 MED ORDER — NOREPINEPHRINE 4 MG/250ML-% IV SOLN
0.0000 ug/min | INTRAVENOUS | Status: AC
Start: 2021-07-21 — End: 2021-07-21
  Administered 2021-07-21: 2 ug/min via INTRAVENOUS
  Filled 2021-07-19: qty 250

## 2021-07-19 MED ORDER — MAGNESIUM SULFATE 2 GM/50ML IV SOLN
2.0000 g | Freq: Once | INTRAVENOUS | Status: AC
  Administered 2021-07-19: 2 g via INTRAVENOUS
  Filled 2021-07-19: qty 50

## 2021-07-19 MED ORDER — FLUCONAZOLE IN SODIUM CHLORIDE 400-0.9 MG/200ML-% IV SOLN
400.0000 mg | INTRAVENOUS | Status: AC
  Administered 2021-07-21: 400 mg via INTRAVENOUS
  Filled 2021-07-19: qty 200

## 2021-07-19 MED ORDER — VASOPRESSIN 20 UNITS/100 ML INFUSION FOR SHOCK
0.0400 [IU]/min | INTRAVENOUS | Status: DC
  Filled 2021-07-19: qty 100

## 2021-07-19 MED ORDER — MAGNESIUM SULFATE 2 GM/50ML IV SOLN
2.0000 g | Freq: Once | INTRAVENOUS | Status: DC
  Filled 2021-07-19: qty 50

## 2021-07-19 MED ORDER — EPINEPHRINE HCL 5 MG/250ML IV SOLN IN NS
0.0000 ug/min | INTRAVENOUS | Status: AC
  Administered 2021-07-21: 3 ug/min via INTRAVENOUS
  Filled 2021-07-19: qty 250

## 2021-07-19 MED ORDER — CEFAZOLIN SODIUM-DEXTROSE 2-4 GM/100ML-% IV SOLN
2.0000 g | INTRAVENOUS | Status: DC
  Filled 2021-07-19: qty 100

## 2021-07-19 MED ORDER — DEXMEDETOMIDINE HCL IN NACL 400 MCG/100ML IV SOLN
0.1000 ug/kg/h | INTRAVENOUS | Status: AC
Start: 2021-07-21 — End: 2021-07-21
  Administered 2021-07-21: .7 ug/kg/h via INTRAVENOUS
  Filled 2021-07-19: qty 100

## 2021-07-19 MED ORDER — VANCOMYCIN HCL 1 G IV SOLR
1000.0000 mg | INTRAVENOUS | Status: DC
  Filled 2021-07-19 (×2): qty 20

## 2021-07-19 NOTE — Progress Notes (Addendum)
Patient ID: Jeffrey [name], male   DOB: December 15, 1975, 45 y.o.   MRN: [id number]     Advanced Heart Failure Rounding Note  PCP-Cardiologist: Glori Bickers, MD   Subjective:    No complaints this morning.   He diuresed well yesterday on milrinone 0.25 + Lasix 80 mg IV bid.  Creatinine mildly lower 1.77.   He remains in NSR on amiodarone gtt + heparin gtt.   Swan: CVP 10-11 PA 51/30 CI 2.3 Co-ox 68%   Objective:   Weight Range: 126.1 kg Body mass index is 43.54 kg/m.   Vital Signs:   Temp:  [97 F (36.1 C)-98.1 F (36.7 C)] 97.2 F (36.2 C) (10/15 0700) Pulse Rate:  [62-172] 69 (10/15 0700) Resp:  [10-36] 30 (10/15 0700) BP: (97-135)/(68-100) 112/83 (10/15 0700) SpO2:  [87 %-97 %] 94 % (10/15 0700) Weight:  [126.1 kg] 126.1 kg (10/15 0500)    Weight change: Filed Weights   07/18/21 0554 07/19/21 0500  Weight: 131.5 kg 126.1 kg    Intake/Output:   Intake/Output Summary (Last 24 hours) at 07/19/2021 0743 Last data filed at 07/19/2021 0700 Gross per 24 hour  Intake 2209.05 ml  Output 6625 ml  Net -4415.95 ml      Physical Exam    General:  Well appearing. No resp difficulty HEENT: Normal Neck: Supple. JVP 12. Carotids 2+ bilat; no bruits. No lymphadenopathy or thyromegaly appreciated. Cor: PMI lateral. Regular rate & rhythm. No rubs, gallops or murmurs. Lungs: Clear Abdomen: Soft, nontender, nondistended. No hepatosplenomegaly. No bruits or masses. Good bowel sounds. Extremities: No cyanosis, clubbing, rash. 1+ ankle edema Neuro: Alert & orientedx3, cranial nerves grossly intact. moves all 4 extremities w/o difficulty. Affect pleasant   Telemetry   NSR 70s (personally reviewed)   Labs    CBC Recent Labs    07/18/21 1006 07/19/21 0210  WBC 5.1 6.4  HGB 12.0* 12.5*  HCT 38.1* 38.9*  MCV 80.4 77.6*  PLT 321 235   Basic Metabolic Panel Recent Labs    07/18/21 1006 07/18/21 1645 07/19/21 0210  NA 140  --  137  K 4.1 3.6 3.7  CL 111  --   105  CO2 22  --  24  GLUCOSE 91  --  140*  BUN 28*  --  25*  CREATININE 1.82*  --  1.77*  CALCIUM 9.4  --  9.2  MG  --   --  1.6*   Liver Function Tests No results for input(s): AST, ALT, ALKPHOS, BILITOT, PROT, ALBUMIN in the last 72 hours. No results for input(s): LIPASE, AMYLASE in the last 72 hours. Cardiac Enzymes No results for input(s): CKTOTAL, CKMB, CKMBINDEX, TROPONINI in the last 72 hours.  BNP: BNP (last 3 results) Recent Labs    04/29/21 1247 06/26/21 1025 07/14/21 0917  BNP 1,407.7* 928.9* 1,021.6*    ProBNP (last 3 results) No results for input(s): PROBNP in the last 8760 hours.   D-Dimer No results for input(s): DDIMER in the last 72 hours. Hemoglobin A1C No results for input(s): HGBA1C in the last 72 hours. Fasting Lipid Panel No results for input(s): CHOL, HDL, LDLCALC, TRIG, CHOLHDL, LDLDIRECT in the last 72 hours. Thyroid Function Tests No results for input(s): TSH, T4TOTAL, T3FREE, THYROIDAB in the last 72 hours.  Invalid input(s): FREET3  Other results:   Imaging    CARDIAC CATHETERIZATION  Result Date: 07/18/2021 Findings: Ao = 118/83 (95) RA = 15 RV = 69/20 PA = 72/35 (48) PCW = 25 Fick  cardiac output/index = 4.6/1.9 Thermo CO/CI = 6.2/2.6 PVR = 5.0 (Fick) 3.7 (Thermo) FA sat = 91% PA sat = 50%, 50% PAPi = 2.5 Assessment: Moderately elevated biventricular pressures with moderately to severely decreased cardiac index Plan/Discussion: Admit to ICU. Start milrinone. Plan VAD Monday once optimized. Glori Bickers, MD 8:41 AM  Port CXR  Result Date: 07/18/2021 CLINICAL DATA:  Swan-Ganz catheter placement EXAM: PORTABLE CHEST 1 VIEW COMPARISON:  Chest radiograph 05/08/2021 FINDINGS: There is a right IJ Swan-Ganz catheter with the tip terminating in the right pulmonary artery. A left chest wall cardiac device with a single lead projecting over the midline is unchanged. The heart is enlarged, unchanged. There is prominence of the central  pulmonary vasculature, also unchanged. There is no focal consolidation or pulmonary edema. There is no pleural effusion or pneumothorax. The bones are stable. IMPRESSION: Right IJ Swan-Ganz catheter in the main pulmonary artery. Electronically Signed   By: Valetta Mole M.D.   On: 07/18/2021 12:50     Medications:     Scheduled Medications:  allopurinol  100 mg Oral Daily   Chlorhexidine Gluconate Cloth  6 each Topical Daily   docusate sodium  100 mg Oral BID   empagliflozin  10 mg Oral QAC breakfast   furosemide  80 mg Intravenous BID   insulin aspart  0-20 Units Subcutaneous TID WC   insulin aspart protamine- aspart  38 Units Subcutaneous BID WC   losartan  12.5 mg Oral Daily   mouth rinse  15 mL Mouth Rinse BID   pantoprazole  40 mg Oral Daily   polyethylene glycol  17 g Oral Daily   potassium chloride  40 mEq Oral BID   sertraline  25 mg Oral Daily   sodium chloride flush  3 mL Intravenous Q12H   sodium chloride flush  3 mL Intravenous Q12H   sodium chloride flush  3 mL Intravenous Q12H   spironolactone  25 mg Oral Daily   traZODone  100 mg Oral QHS    Infusions:  sodium chloride 20 mL/hr at 07/18/21 1021   sodium chloride     sodium chloride 20 mL/hr at 07/18/21 1020   sodium chloride Stopped (07/19/21 0518)   sodium chloride     amiodarone 30 mg/hr (07/19/21 0700)   heparin 2,050 Units/hr (07/19/21 0723)   milrinone 0.25 mcg/kg/min (07/19/21 0700)    PRN Medications: sodium chloride, sodium chloride, sodium chloride, acetaminophen, ondansetron (ZOFRAN) IV, sodium chloride flush, sodium chloride flush   Assessment/Plan   1. Acute on Chronic systolic CHF - Nonischemic CM, end-stage - Echo (9/21): EF 20-25%.   - Echo  8/22: EF < 20%, severe LV dilation, moderate RV dysfunction.   - RHC (8/22): well compensated hemodynamics. - s/p S-ICD - NYHA III-IIIB  - Continue losartan - Continue spiro 25 mg daily - Continue Jardiance 10 mg daily - No BB d/t recent shock. -  Body mass index is 45.42 kg/m. Not transplant candidate - RHC 10/14 with elevated filling pressures and low CO. Started milrinone 0.25 for optimization. This morning, CI 2.3 with co-ox 68%. Continue milrinone. - CVP 12 with good diuresis on IV Lasix.  Continue Lasix 80 mg IV bid for 1 more day and replace K.  Probably to po tomorrow.  - Creatinine mildly lower 1.77.  - Plan for DT VAD on 10/17   2 Atrial fibrillation: Paroxysmal - Emergent TEE-guided DCCV 7/27 due to inability to control HR. - Recurrent AF on 8/5. s/p DC-CV on 05/10/21.  -  Seen by EP. Not a candidate for ablation due to body habitus  (could consider AVN ablation and CRT, if needed)  - In NSR today - Conitnue IV amio to prevent recurrent VT on milrinone - IV heparin while off apixaban.    3. CKD stage 3B  - Has solitary kidney.   - Baseline creatinine is 1.6-2.9.  - Avoid hypotension. - Hopefully will improve with milrinone support => 1.77 today.    4. OSA - Continue CPAP.    5. DM2. Poorly controlled - On Jardiance. - Hgb A1c 9.4. - Cover with SSI   6. Hematuria - MRI and cystoscopy ok   Can get out of bed to chair.   CRITICAL CARE Performed by: Loralie Champagne  Total critical care time: 35 minutes  Critical care time was exclusive of separately billable procedures and treating other patients.  Critical care was necessary to treat or prevent imminent or life-threatening deterioration.  Critical care was time spent personally by me on the following activities: development of treatment plan with patient and/or surrogate as well as nursing, discussions with consultants, evaluation of patient's response to treatment, examination of patient, obtaining history from patient or surrogate, ordering and performing treatments and interventions, ordering and review of laboratory studies, ordering and review of radiographic studies, pulse oximetry and re-evaluation of patient's condition.   Length of Stay: 1  Loralie Champagne, MD  07/19/2021, 7:43 AM  Advanced Heart Failure Team Pager [phone] (M-F; 7a - 5p)  Please contact [facility] for night-coverage after hours (5p -7a ) and weekends on [email or web address]

## 2021-07-19 NOTE — Progress Notes (Signed)
[place] for heparin Indication: atrial fibrillation  Allergies  Allergen Reactions   Aspirin Shortness Of Breath, Itching and Rash     Burning sensation (Patient reports he tolerates other NSAIDS)    Bee Venom Hives and Swelling   Lisinopril Cough   Shrimp (Diagnostic) Rash   Tomato Rash    Patient Measurements: Height: 5\' 7"  (170.2 cm) Weight: 131.5 kg (290 lb) IBW/kg (Calculated) : 66.1 Heparin Dosing Weight: 97kg  Vital Signs: Temp: 97.3 F (36.3 C) (10/15 0300) Temp Source: Core (10/14 1600) BP: 105/71 (10/15 0300) Pulse Rate: 66 (10/15 0300)  Labs: Recent Labs    07/18/21 0815 07/18/21 0952 07/18/21 1006 07/19/21 0210  HGB 12.9*  --  12.0* 12.5*  HCT 38.0*  --  38.1* 38.9*  PLT  --   --  321 300  APTT  --   --  32  --   HEPARINUNFRC  --  <0.10*  --  0.20*  CREATININE  --   --  1.82* 1.77*    Estimated Creatinine Clearance: 68.1 mL/min (A) (by C-G formula based on SCr of 1.77 mg/dL (H)).   Medical History: Past Medical History:  Diagnosis Date   Anxiety    Aspirin allergy    Childhood asthma    Chronic systolic CHF (congestive heart failure) ([place])    a. EF 20-25% in 2012. b. EF 45-50% in 10/2011 with nonischemic nuc - presumed NICM. c. 12/2014 Echo: Sev depressed LV fxn, sev dil LV, mild LVH, mild MR, sev dil LA, mildly reduced RV fxn.   CKD (chronic kidney disease) stage 2, GFR 60-89 ml/min    H/O vasectomy 12/2019   High cholesterol    Hypertension    Morbid obesity ([place])    Nephrolithiasis    OSA on CPAP    Paroxysmal atrial fibrillation ([place])    Presumed NICM    a. 04/2014 Myoview: EF 26%, glob HK, sev glob HK, ? prior infarct;  b. Never cathed 2/2 CKD.   Renal cell carcinoma ([place])    a. s/p Rt robotic assisted partial converted to radical nephrectomy on 01/2013.   Troponin level elevated    a. 04/2014, 12/2014: felt due to CHF.   Type II diabetes mellitus (HCC)    Ventricular tachycardia    a. appropriate  ICD therapy 12/2017   Assessment: 45 year old male with end stage heart failure, admitted post RHC with low cardiac output. Patient scheduled for LVAD next week. Patient with history of afib on apixaban prior to admission, will bridge with IV heparin until surgery. CBC appears stable from recent outpatient checks. No bleeding or hematoma noted post cath.   Heparin level subtherapeutic (0.2) on gtt at 1800 units/hr. No issues with line or bleeding reported per RN.  Goal of Therapy:  Heparin level 0.3-0.7 units/ml Monitor platelets by anticoagulation protocol: Yes   Plan:  Increase heparin to 2050 units/hr F/u 6 hr heparin level  Sherlon Handing, PharmD, BCPS Please see amion for complete clinical pharmacist phone list 07/19/2021 3:45 AM

## 2021-07-19 NOTE — Evaluation (Signed)
Physical Therapy Evaluation Patient Details Name: Jeffrey [name] MRN: [id number] DOB: 03-Jul-1976 Today's Date: 07/19/2021  History of Present Illness  45 y.o. male presented at [facility] and discussed with [facility] transplant team and he was approved for DT VAD to be placed 07/21/21 Has been relatively stable as outpatient with NYHA 3B symptoms. Underwent RHC today with swan placement for pre-VAD optimization. PMH:  poorly controlled HTN, R renal cell carcinoma s/p nephrectomy, CKD IIIa, DM2, OSA, gout, morbid obesity and systolic HF due to NICM.  Clinical Impression  PTA pt had just progressed to independence with ambulation and ADLs post hospitalization this summer. Pt lives with wife and 2 young children in single story home with ramped entrance. Pt is currently supervision for bed mobility and transfers largely due to safety with lines and leads as pt is strength and balance is good. Pt awaiting VAD placement 07/21/21. Pt will need re-evaluation post surgery, however given current level of function will likely be able to discharge home with HHPT and 24 hour assit. PT will follow back post surgery.        Recommendations for follow up therapy are one component of a multi-disciplinary discharge planning process, led by the attending physician.  Recommendations may be updated based on patient status, additional functional criteria and insurance authorization.  Follow Up Recommendations Home health PT;Supervision/Assistance - 24 hour    Equipment Recommendations  None recommended by PT (has necessary equipment)       Precautions / Restrictions Precautions Precautions: Other (comment) Gordy Councilman) Precaution Comments: Gordy Councilman placement for preVAD optimization Restrictions Weight Bearing Restrictions: No      Mobility  Bed Mobility Overal bed mobility: Needs Assistance Bed Mobility: Supine to Sit     Supine to sit: Supervision     General bed mobility comments: increased time required for  safety with lines and leads, no physical assist required    Transfers Overall transfer level: Needs assistance Equipment used: Rolling walker (2 wheeled) Transfers: Sit to/from Stand Sit to Stand: Supervision         General transfer comment: supervision for safety, good power up and steadying, provided RW for UE support while bed removed from behind pt and recliner replaced behind him. with vc pt able to back up to recliner and sit with good control  Ambulation/Gait Deferred due to line limitations.                        Balance Overall balance assessment: Mild deficits observed, not formally tested                                           Pertinent Vitals/Pain Pain Assessment: Faces Faces Pain Scale: Hurts little more Pain Location: headache Pain Descriptors / Indicators: Headache Pain Intervention(s): Limited activity within patient's tolerance;Monitored during session;Repositioned;Patient requesting pain meds-RN notified;RN gave pain meds during session    Home Living Family/patient expects to be discharged to:: Private residence Living Arrangements: Spouse/significant other;Children Available Help at Discharge: Available 24 hours/day;Other (Comment) (wife taking leave for VAD recovery) Type of Home: House Home Access: Stairs to enter;Ramped entrance Entrance Stairs-Rails: Right;Left;Can reach both Entrance Stairs-Number of Steps: 5; ramp in back Home Layout: One level Home Equipment: Tub bench;Walker - 2 wheels;Cane - single point      Prior Function Level of Independence: Independent  Comments: was using RW until last week when he was independent for mobility, independent for ADLs     Hand Dominance   Dominant Hand: Right    Extremity/Trunk Assessment   Upper Extremity Assessment Upper Extremity Assessment: Overall WFL for tasks assessed    Lower Extremity Assessment Lower Extremity Assessment: Overall WFL for  tasks assessed    Cervical / Trunk Assessment Cervical / Trunk Assessment: Other exceptions Gordy Councilman placement increasing R cervical lateral flezion)  Communication   Communication: No difficulties  Cognition Arousal/Alertness: Awake/alert Behavior During Therapy: WFL for tasks assessed/performed Overall Cognitive Status: Within Functional Limits for tasks assessed                                        General Comments General comments (skin integrity, edema, etc.): VSS on 3L O2 via [place]        Assessment/Plan    PT Assessment Patient needs continued PT services  PT Problem List Cardiopulmonary status limiting activity       PT Treatment Interventions DME instruction;Gait training;Functional mobility training;Therapeutic activities;Therapeutic exercise;Balance training;Patient/family education    PT Goals (Current goals can be found in the Care Plan section)  Acute Rehab PT Goals Patient Stated Goal: go home to family PT Goal Formulation: With patient Time For Goal Achievement: 08/02/21 Potential to Achieve Goals: Good    Frequency Min 3X/week    AM-PAC PT "6 Clicks" Mobility  Outcome Measure Help needed turning from your back to your side while in a flat bed without using bedrails?: None Help needed moving from lying on your back to sitting on the side of a flat bed without using bedrails?: None Help needed moving to and from a bed to a chair (including a wheelchair)?: None Help needed standing up from a chair using your arms (e.g., wheelchair or bedside chair)?: None Help needed to walk in hospital room?: A Lot Help needed climbing 3-5 steps with a railing? : A Lot 6 Click Score: 20    End of Session Equipment Utilized During Treatment: Oxygen Activity Tolerance: Patient tolerated treatment well;Other (comment) (treatment limited due to line placement) Patient left: in chair;with call [place]/phone within reach Nurse Communication: Mobility  status PT Visit Diagnosis: Other abnormalities of gait and mobility (R26.89);Difficulty in walking, not elsewhere classified (R26.2)    Time: 2426-8341 PT Time Calculation (min) (ACUTE ONLY): 27 min   Charges:   PT Evaluation $PT Eval Moderate Complexity: 1 Mod PT Treatments $Therapeutic Activity: 8-22 mins        Beverlee Wilmarth B. Migdalia Dk PT, DPT Acute Rehabilitation Services Pager [phone] Office [phone]   Maish Vaya 07/19/2021, 1:05 PM

## 2021-07-19 NOTE — Progress Notes (Signed)
[facility] for heparin Indication: atrial fibrillation  Allergies  Allergen Reactions   Aspirin Shortness Of Breath, Itching and Rash     Burning sensation (Patient reports he tolerates other NSAIDS)    Bee Venom Hives and Swelling   Lisinopril Cough   Shrimp (Diagnostic) Rash   Tomato Rash    Patient Measurements: Height: 5\' 7"  (170.2 cm) Weight: 126.1 kg (278 lb) IBW/kg (Calculated) : 66.1 Heparin Dosing Weight: 97kg  Vital Signs: Temp: 97.5 F (36.4 C) (10/15 1000) BP: 119/88 (10/15 1000) Pulse Rate: 72 (10/15 1000)  Labs: Recent Labs    07/18/21 0815 07/18/21 0952 07/18/21 1006 07/19/21 0210 07/19/21 1001  HGB 12.9*  --  12.0* 12.5*  --   HCT 38.0*  --  38.1* 38.9*  --   PLT  --   --  321 300  --   APTT  --   --  32  --   --   HEPARINUNFRC  --  <0.10*  --  0.20* 0.52  CREATININE  --   --  1.82* 1.77*  --      Estimated Creatinine Clearance: 66.5 mL/min (A) (by C-G formula based on SCr of 1.77 mg/dL (H)).   Medical History: Past Medical History:  Diagnosis Date   Anxiety    Aspirin allergy    Childhood asthma    Chronic systolic CHF (congestive heart failure) ([place])    a. EF 20-25% in 2012. b. EF 45-50% in 10/2011 with nonischemic nuc - presumed NICM. c. 12/2014 Echo: Sev depressed LV fxn, sev dil LV, mild LVH, mild MR, sev dil LA, mildly reduced RV fxn.   CKD (chronic kidney disease) stage 2, GFR 60-89 ml/min    H/O vasectomy 12/2019   High cholesterol    Hypertension    Morbid obesity ([place])    Nephrolithiasis    OSA on CPAP    Paroxysmal atrial fibrillation ([place])    Presumed NICM    a. 04/2014 Myoview: EF 26%, glob HK, sev glob HK, ? prior infarct;  b. Never cathed 2/2 CKD.   Renal cell carcinoma ([place])    a. s/p Rt robotic assisted partial converted to radical nephrectomy on 01/2013.   Troponin level elevated    a. 04/2014, 12/2014: felt due to CHF.   Type II diabetes mellitus (HCC)    Ventricular tachycardia     a. appropriate ICD therapy 12/2017   Assessment: 45 year old male with end stage heart failure, admitted post RHC with low cardiac output. Patient scheduled for LVAD next week. Patient with history of afib on apixaban prior to admission, will bridge with IV heparin until surgery. CBC appears stable from recent outpatient checks. No bleeding or hematoma noted post cath.   Heparin level therapeutic (0.52) on gtt at 2050 units/hr. No issues with line or bleeding reported per RN.  Goal of Therapy:  Heparin level 0.3-0.7 units/ml Monitor platelets by anticoagulation protocol: Yes   Plan:  Continue heparin to 2050 units/hr F/u 6 hr heparin level  Cathrine Muster, PharmD PGY2 Cardiology Pharmacy Resident Phone: [phone] 07/19/2021  11:49 AM  Please check [email or web address] for unit-specific pharmacy phone numbers.

## 2021-07-19 NOTE — Progress Notes (Signed)
Pt has home CPAP and asked RT to help with set up. Pt is still on [place] therefore, adaptor provided for o2 bleed in of 2L (current setting) through home CPAP and pt stated he will place himself on machine when ready.

## 2021-07-19 NOTE — Progress Notes (Signed)
[name] for heparin Indication: atrial fibrillation  Allergies  Allergen Reactions   Aspirin Shortness Of Breath, Itching and Rash     Burning sensation (Patient reports he tolerates other NSAIDS)    Bee Venom Hives and Swelling   Lisinopril Cough   Shrimp (Diagnostic) Rash   Tomato Rash    Patient Measurements: Height: 5\' 7"  (170.2 cm) Weight: 126.1 kg (278 lb) IBW/kg (Calculated) : 66.1 Heparin Dosing Weight: 97kg  Vital Signs: Temp: 98.6 F (37 C) (10/15 1700) Temp Source: Core (10/15 1200) BP: 107/79 (10/15 1500) Pulse Rate: 83 (10/15 1500)  Labs: Recent Labs    07/18/21 0815 07/18/21 0952 07/18/21 1006 07/19/21 0210 07/19/21 1001 07/19/21 1638  HGB 12.9*  --  12.0* 12.5*  --   --   HCT 38.0*  --  38.1* 38.9*  --   --   PLT  --   --  321 300  --   --   APTT  --   --  32  --   --   --   HEPARINUNFRC  --    < >  --  0.20* 0.52 0.56  CREATININE  --   --  1.82* 1.77*  --   --    < > = values in this interval not displayed.     Estimated Creatinine Clearance: 66.5 mL/min (A) (by C-G formula based on SCr of 1.77 mg/dL (H)).   Medical History: Past Medical History:  Diagnosis Date   Anxiety    Aspirin allergy    Childhood asthma    Chronic systolic CHF (congestive heart failure) ([place])    a. EF 20-25% in 2012. b. EF 45-50% in 10/2011 with nonischemic nuc - presumed NICM. c. 12/2014 Echo: Sev depressed LV fxn, sev dil LV, mild LVH, mild MR, sev dil LA, mildly reduced RV fxn.   CKD (chronic kidney disease) stage 2, GFR 60-89 ml/min    H/O vasectomy 12/2019   High cholesterol    Hypertension    Morbid obesity ([place])    Nephrolithiasis    OSA on CPAP    Paroxysmal atrial fibrillation ([place])    Presumed NICM    a. 04/2014 Myoview: EF 26%, glob HK, sev glob HK, ? prior infarct;  b. Never cathed 2/2 CKD.   Renal cell carcinoma ([place])    a. s/p Rt robotic assisted partial converted to radical nephrectomy on 01/2013.   Troponin  level elevated    a. 04/2014, 12/2014: felt due to CHF.   Type II diabetes mellitus (HCC)    Ventricular tachycardia    a. appropriate ICD therapy 12/2017   Assessment: 45 year old male with end stage heart failure, admitted post RHC with low cardiac output. Patient scheduled for LVAD next week. Patient with history of afib on apixaban prior to admission, will bridge with IV heparin until surgery. CBC appears stable from recent outpatient checks. No bleeding or hematoma noted post cath.   Heparin level therapeutic (0.5) on gtt at 2050 units/hr. No issues with line or bleeding reported per RN.  Goal of Therapy:  Heparin level 0.3-0.7 units/ml Monitor platelets by anticoagulation protocol: Yes   Plan:  Continue heparin to 2050 units/hr Daily heparin level  Erin Hearing PharmD., BCPS Clinical Pharmacist 07/19/2021 5:26 PM

## 2021-07-20 ENCOUNTER — Inpatient Hospital Stay (HOSPITAL_COMMUNITY): Payer: Medicaid Other

## 2021-07-20 DIAGNOSIS — Z452 Encounter for adjustment and management of vascular access device: Secondary | ICD-10-CM | POA: Diagnosis not present

## 2021-07-20 DIAGNOSIS — I517 Cardiomegaly: Secondary | ICD-10-CM | POA: Diagnosis not present

## 2021-07-20 DIAGNOSIS — R0989 Other specified symptoms and signs involving the circulatory and respiratory systems: Secondary | ICD-10-CM | POA: Diagnosis not present

## 2021-07-20 DIAGNOSIS — I420 Dilated cardiomyopathy: Secondary | ICD-10-CM | POA: Diagnosis not present

## 2021-07-20 DIAGNOSIS — I5023 Acute on chronic systolic (congestive) heart failure: Secondary | ICD-10-CM | POA: Diagnosis not present

## 2021-07-20 LAB — COMPREHENSIVE METABOLIC PANEL
ALT: 42 U/L (ref 0–44)
AST: 20 U/L (ref 15–41)
Albumin: 3.4 g/dL — ABNORMAL LOW (ref 3.5–5.0)
Alkaline Phosphatase: 69 U/L (ref 38–126)
Anion gap: 7 (ref 5–15)
BUN: 21 mg/dL — ABNORMAL HIGH (ref 6–20)
CO2: 27 mmol/L (ref 22–32)
Calcium: 9.9 mg/dL (ref 8.9–10.3)
Chloride: 102 mmol/L (ref 98–111)
Creatinine, Ser: 1.53 mg/dL — ABNORMAL HIGH (ref 0.61–1.24)
GFR, Estimated: 56 mL/min — ABNORMAL LOW (ref >60)
Glucose, Bld: 116 mg/dL — ABNORMAL HIGH (ref 70–99)
Potassium: 4.2 mmol/L (ref 3.5–5.1)
Sodium: 136 mmol/L (ref 135–145)
Total Bilirubin: 0.7 mg/dL (ref 0.3–1.2)
Total Protein: 7.4 g/dL (ref 6.5–8.1)

## 2021-07-20 LAB — COOXEMETRY PANEL
Carboxyhemoglobin: 1.5 % (ref 0.5–1.5)
Methemoglobin: 0.6 % (ref 0.0–1.5)
O2 Saturation: 80.3 %
Total hemoglobin: 13.8 g/dL (ref 12.0–16.0)

## 2021-07-20 LAB — POCT I-STAT 7, (LYTES, BLD GAS, ICA,H+H)
Acid-Base Excess: 2 mmol/L (ref 0.0–2.0)
Bicarbonate: 23.9 mmol/L (ref 20.0–28.0)
Calcium, Ion: 1.25 mmol/L (ref 1.15–1.40)
HCT: 46 % (ref 39.0–52.0)
Hemoglobin: 15.6 g/dL (ref 13.0–17.0)
O2 Saturation: 98 %
Patient temperature: 37.3
Potassium: 4.3 mmol/L (ref 3.5–5.1)
Sodium: 135 mmol/L (ref 135–145)
TCO2: 25 mmol/L (ref 22–32)
pCO2 arterial: 30.2 mmHg — ABNORMAL LOW (ref 32.0–48.0)
pH, Arterial: 7.509 — ABNORMAL HIGH (ref 7.350–7.450)
pO2, Arterial: 101 mmHg (ref 83.0–108.0)

## 2021-07-20 LAB — HEPARIN LEVEL (UNFRACTIONATED)
Heparin Unfractionated: 0.73 IU/mL — ABNORMAL HIGH (ref 0.30–0.70)
Heparin Unfractionated: 0.37 IU/mL (ref 0.30–0.70)

## 2021-07-20 LAB — URINALYSIS, ROUTINE W REFLEX MICROSCOPIC
Bacteria, UA: NONE SEEN
Bilirubin Urine: NEGATIVE
Glucose, UA: >=500 mg/dL — AB
Hgb urine dipstick: NEGATIVE
Ketones, ur: NEGATIVE mg/dL
Leukocytes,Ua: NEGATIVE
Nitrite: NEGATIVE
Protein, ur: NEGATIVE mg/dL
Specific Gravity, Urine: 1.01 (ref 1.005–1.030)
pH: 6 (ref 5.0–8.0)

## 2021-07-20 LAB — BASIC METABOLIC PANEL
Anion gap: 11 (ref 5–15)
Anion gap: 7 (ref 5–15)
BUN: 23 mg/dL — ABNORMAL HIGH (ref 6–20)
BUN: 27 mg/dL — ABNORMAL HIGH (ref 6–20)
CO2: 24 mmol/L (ref 22–32)
CO2: 24 mmol/L (ref 22–32)
Calcium: 9.4 mg/dL (ref 8.9–10.3)
Calcium: 9.8 mg/dL (ref 8.9–10.3)
Chloride: 101 mmol/L (ref 98–111)
Chloride: 102 mmol/L (ref 98–111)
Creatinine, Ser: 1.54 mg/dL — ABNORMAL HIGH (ref 0.61–1.24)
Creatinine, Ser: 1.68 mg/dL — ABNORMAL HIGH (ref 0.61–1.24)
GFR, Estimated: 50 mL/min — ABNORMAL LOW (ref >60)
GFR, Estimated: 56 mL/min — ABNORMAL LOW (ref >60)
Glucose, Bld: 133 mg/dL — ABNORMAL HIGH (ref 70–99)
Glucose, Bld: 183 mg/dL — ABNORMAL HIGH (ref 70–99)
Potassium: 3.5 mmol/L (ref 3.5–5.1)
Potassium: 4.3 mmol/L (ref 3.5–5.1)
Sodium: 132 mmol/L — ABNORMAL LOW (ref 135–145)
Sodium: 137 mmol/L (ref 135–145)

## 2021-07-20 LAB — GLUCOSE, CAPILLARY
Glucose-Capillary: 127 mg/dL — ABNORMAL HIGH (ref 70–99)
Glucose-Capillary: 113 mg/dL — ABNORMAL HIGH (ref 70–99)
Glucose-Capillary: 157 mg/dL — ABNORMAL HIGH (ref 70–99)
Glucose-Capillary: 182 mg/dL — ABNORMAL HIGH (ref 70–99)

## 2021-07-20 LAB — CBC
HCT: 42.7 % (ref 39.0–52.0)
Hemoglobin: 13.7 g/dL (ref 13.0–17.0)
MCH: 24.9 pg — ABNORMAL LOW (ref 26.0–34.0)
MCHC: 32.1 g/dL (ref 30.0–36.0)
MCV: 77.5 fL — ABNORMAL LOW (ref 80.0–100.0)
Platelets: 340 10*3/uL (ref 150–400)
RBC: 5.51 MIL/uL (ref 4.22–5.81)
RDW: 15.8 % — ABNORMAL HIGH (ref 11.5–15.5)
WBC: 8.2 10*3/uL (ref 4.0–10.5)
nRBC: 0 % (ref 0.0–0.2)

## 2021-07-20 LAB — SURGICAL PCR SCREEN
MRSA, PCR: NEGATIVE
Staphylococcus aureus: POSITIVE — AB

## 2021-07-20 LAB — PROTIME-INR
INR: 1.2 (ref 0.8–1.2)
Prothrombin Time: 14.9 seconds (ref 11.4–15.2)

## 2021-07-20 LAB — HEMOGLOBIN A1C
Hgb A1c MFr Bld: 5.9 % — ABNORMAL HIGH (ref 4.8–5.6)
Mean Plasma Glucose: 122.63 mg/dL

## 2021-07-20 LAB — PREPARE RBC (CROSSMATCH)

## 2021-07-20 LAB — MAGNESIUM: Magnesium: 1.8 mg/dL (ref 1.7–2.4)

## 2021-07-20 MED ORDER — TRAMADOL HCL 50 MG PO TABS
50.0000 mg | ORAL_TABLET | Freq: Two times a day (BID) | ORAL | Status: DC | PRN
  Administered 2021-07-20: 50 mg via ORAL
  Filled 2021-07-20: qty 1

## 2021-07-20 MED ORDER — MAGNESIUM SULFATE IN D5W 1-5 GM/100ML-% IV SOLN
1.0000 g | Freq: Once | INTRAVENOUS | Status: AC
  Administered 2021-07-20: 1 g via INTRAVENOUS
  Filled 2021-07-20: qty 100

## 2021-07-20 MED ORDER — POTASSIUM CHLORIDE CRYS ER 10 MEQ PO TBCR
40.0000 meq | EXTENDED_RELEASE_TABLET | Freq: Once | ORAL | Status: AC
  Administered 2021-07-20: 40 meq via ORAL
  Filled 2021-07-20: qty 4

## 2021-07-20 MED ORDER — MUPIROCIN 2 % EX OINT
1.0000 "application " | TOPICAL_OINTMENT | Freq: Two times a day (BID) | CUTANEOUS | Status: AC
  Administered 2021-07-20 – 2021-07-24 (×9): 1 via NASAL
  Filled 2021-07-20: qty 22

## 2021-07-20 MED ORDER — CHLORHEXIDINE GLUCONATE CLOTH 2 % EX PADS
6.0000 | MEDICATED_PAD | Freq: Once | CUTANEOUS | Status: AC
  Administered 2021-07-21: 6 via TOPICAL

## 2021-07-20 MED ORDER — MAGNESIUM OXIDE -MG SUPPLEMENT 400 (240 MG) MG PO TABS
800.0000 mg | ORAL_TABLET | Freq: Every day | ORAL | Status: DC
  Administered 2021-07-20: 800 mg via ORAL
  Filled 2021-07-20: qty 2

## 2021-07-20 MED ORDER — LIDOCAINE 5 % EX PTCH
1.0000 | MEDICATED_PATCH | CUTANEOUS | Status: DC
Start: 2021-07-20 — End: 2021-07-21
  Administered 2021-07-20: 1 via TRANSDERMAL
  Filled 2021-07-20: qty 1

## 2021-07-20 MED ORDER — CHLORHEXIDINE GLUCONATE CLOTH 2 % EX PADS
6.0000 | MEDICATED_PAD | Freq: Once | CUTANEOUS | Status: AC
  Administered 2021-07-20: 6 via TOPICAL

## 2021-07-20 MED ORDER — CHLORHEXIDINE GLUCONATE 0.12 % MT SOLN
15.0000 mL | Freq: Once | OROMUCOSAL | Status: AC
  Administered 2021-07-21: 15 mL via OROMUCOSAL

## 2021-07-20 MED ORDER — METOPROLOL TARTRATE 12.5 MG HALF TABLET: 12.5000 mg | ORAL_TABLET | Freq: Once | ORAL | Status: DC

## 2021-07-20 MED ORDER — POTASSIUM CHLORIDE CRYS ER 20 MEQ PO TBCR
40.0000 meq | EXTENDED_RELEASE_TABLET | Freq: Once | ORAL | Status: AC
  Administered 2021-07-20: 40 meq via ORAL
  Filled 2021-07-20: qty 2

## 2021-07-20 MED ORDER — BISACODYL 5 MG PO TBEC
5.0000 mg | DELAYED_RELEASE_TABLET | Freq: Once | ORAL | Status: AC
  Administered 2021-07-20: 5 mg via ORAL
  Filled 2021-07-20: qty 1

## 2021-07-20 MED ORDER — TEMAZEPAM 15 MG PO CAPS: 15.0000 mg | ORAL_CAPSULE | Freq: Once | ORAL | Status: DC | PRN

## 2021-07-20 NOTE — Progress Notes (Signed)
[facility] for heparin Indication: atrial fibrillation  Allergies  Allergen Reactions   Aspirin Shortness Of Breath, Itching and Rash     Burning sensation (Patient reports he tolerates other NSAIDS)    Bee Venom Hives and Swelling   Lisinopril Cough   Shrimp (Diagnostic) Rash   Tomato Rash    Patient Measurements: Height: 5\' 7"  (170.2 cm) Weight: 125.9 kg (277 lb 9 oz) IBW/kg (Calculated) : 66.1 Heparin Dosing Weight: 97kg  Vital Signs: Temp: 99.1 F (37.3 C) (10/16 1500) Temp Source: Core (10/16 0800) BP: 105/78 (10/16 1430) Pulse Rate: 82 (10/16 1500)  Labs: Recent Labs    07/18/21 1006 07/19/21 0210 07/19/21 1001 07/19/21 1638 07/20/21 0001 07/20/21 0500 07/20/21 1107 07/20/21 1539  HGB 12.0* 12.5*  --   --  13.7  --   --  15.6  HCT 38.1* 38.9*  --   --  42.7  --   --  46.0  PLT 321 300  --   --  340  --   --   --   APTT 32  --   --   --   --   --   --   --   LABPROT  --   --   --   --   --  14.9  --   --   INR  --   --   --   --   --  1.2  --   --   HEPARINUNFRC  --  0.20* 0.52 0.56  --  0.73*  --   --   CREATININE 1.82* 1.77*  --   --  1.54*  --  1.53*  --      Estimated Creatinine Clearance: 76.8 mL/min (A) (by C-G formula based on SCr of 1.53 mg/dL (H)).   Medical History: Past Medical History:  Diagnosis Date   Anxiety    Aspirin allergy    Childhood asthma    Chronic systolic CHF (congestive heart failure) ([place])    a. EF 20-25% in 2012. b. EF 45-50% in 10/2011 with nonischemic nuc - presumed NICM. c. 12/2014 Echo: Sev depressed LV fxn, sev dil LV, mild LVH, mild MR, sev dil LA, mildly reduced RV fxn.   CKD (chronic kidney disease) stage 2, GFR 60-89 ml/min    H/O vasectomy 12/2019   High cholesterol    Hypertension    Morbid obesity ([place])    Nephrolithiasis    OSA on CPAP    Paroxysmal atrial fibrillation ([place])    Presumed NICM    a. 04/2014 Myoview: EF 26%, glob HK, sev glob HK, ? prior infarct;  b.  Never cathed 2/2 CKD.   Renal cell carcinoma ([place])    a. s/p Rt robotic assisted partial converted to radical nephrectomy on 01/2013.   Troponin level elevated    a. 04/2014, 12/2014: felt due to CHF.   Type II diabetes mellitus (HCC)    Ventricular tachycardia    a. appropriate ICD therapy 12/2017   Assessment: 45 year old male with end stage heart failure, admitted post RHC with low cardiac output. Patient scheduled for LVAD 10/17. Patient with history of afib on apixaban prior to admission, will bridge with IV heparin until surgery. CBC appears stable from recent outpatient checks. No bleeding or hematoma noted post cath.   Heparin level now at goal (0.37) on gtt at 1950 units/hr. No issues with line or bleeding reported per RN.  Goal of Therapy:  Heparin level 0.3-0.7 units/ml Monitor platelets by anticoagulation protocol: Yes   Plan:  Continue heparin at 1950 units/hr Daily heparin level and CBC  Erin Hearing PharmD., BCPS Clinical Pharmacist 07/20/2021 4:11 PM

## 2021-07-20 NOTE — Progress Notes (Signed)
Patient ID: Jeffrey [name], male   DOB: 06/05/1976, 45 y.o.   MRN: [id number]     Advanced Heart Failure Rounding Note  PCP-Cardiologist: Glori Bickers, MD   Subjective:    No complaints this morning.   He diuresed well yesterday on milrinone 0.25 + Lasix 80 mg IV bid, I/Os net negative 4450.  Creatinine mildly lower 1.5.   He remains in NSR on amiodarone gtt + heparin gtt.   [place]: CVP 9-10 PA 70/39 PCWP 30 CI 2.1 Co-ox 80%   Objective:   Weight Range: 125.9 kg Body mass index is 43.47 kg/m.   Vital Signs:   Temp:  [97 F (36.1 C)-98.8 F (37.1 C)] 97.5 F (36.4 C) (10/16 0700) Pulse Rate:  [64-87] 64 (10/16 0700) Resp:  [15-39] 29 (10/16 0700) BP: (90-125)/(64-98) 103/77 (10/16 0700) SpO2:  [91 %-100 %] 93 % (10/16 0700) Weight:  [125.9 kg] 125.9 kg (10/16 0500)    Weight change: Filed Weights   07/18/21 0554 07/19/21 0500 07/20/21 0500  Weight: 131.5 kg 126.1 kg 125.9 kg    Intake/Output:   Intake/Output Summary (Last 24 hours) at 07/20/2021 0753 Last data filed at 07/20/2021 0600 Gross per 24 hour  Intake 2100.39 ml  Output 6550 ml  Net -4449.61 ml      Physical Exam    General: NAD Neck: JVP 10 cm, no thyromegaly or thyroid nodule.  Lungs: Clear to auscultation bilaterally with normal respiratory effort. CV: Lateral PMI.  Heart regular S1/S2, no S3/S4, no murmur.  No peripheral edema.   Abdomen: Soft, nontender, no hepatosplenomegaly, no distention.  Skin: Intact without lesions or rashes.  Neurologic: Alert and oriented x 3.  Psych: Normal affect. Extremities: No clubbing or cyanosis.  HEENT: Normal.     Telemetry   NSR 70s (personally reviewed)   Labs    CBC Recent Labs    07/19/21 0210 07/20/21 0001  WBC 6.4 8.2  HGB 12.5* 13.7  HCT 38.9* 42.7  MCV 77.6* 77.5*  PLT 300 527   Basic Metabolic Panel Recent Labs    07/19/21 0210 07/20/21 0000 07/20/21 0001  NA 137  --  137  K 3.7  --  3.5  CL 105  --  102  CO2 24   --  24  GLUCOSE 140*  --  133*  BUN 25*  --  23*  CREATININE 1.77*  --  1.54*  CALCIUM 9.2  --  9.4  MG 1.6* 1.8  --    Liver Function Tests No results for input(s): AST, ALT, ALKPHOS, BILITOT, PROT, ALBUMIN in the last 72 hours. No results for input(s): LIPASE, AMYLASE in the last 72 hours. Cardiac Enzymes No results for input(s): CKTOTAL, CKMB, CKMBINDEX, TROPONINI in the last 72 hours.  BNP: BNP (last 3 results) Recent Labs    04/29/21 1247 06/26/21 1025 07/14/21 0917  BNP 1,407.7* 928.9* 1,021.6*    ProBNP (last 3 results) No results for input(s): PROBNP in the last 8760 hours.   D-Dimer No results for input(s): DDIMER in the last 72 hours. Hemoglobin A1C No results for input(s): HGBA1C in the last 72 hours. Fasting Lipid Panel No results for input(s): CHOL, HDL, LDLCALC, TRIG, CHOLHDL, LDLDIRECT in the last 72 hours. Thyroid Function Tests No results for input(s): TSH, T4TOTAL, T3FREE, THYROIDAB in the last 72 hours.  Invalid input(s): FREET3  Other results:   Imaging    No results found.   Medications:     Scheduled Medications:  allopurinol  100  mg Oral Daily   Chlorhexidine Gluconate Cloth  6 each Topical Daily   docusate sodium  100 mg Oral BID   empagliflozin  10 mg Oral QAC breakfast   [START ON 07/21/2021] epinephrine  0-10 mcg/min Intravenous To OR   furosemide  80 mg Intravenous BID   insulin aspart  0-20 Units Subcutaneous TID WC   insulin aspart protamine- aspart  38 Units Subcutaneous BID WC   [START ON 07/21/2021] insulin   Intravenous To OR   losartan  12.5 mg Oral Daily   magnesium oxide  800 mg Oral Daily   [START ON 07/21/2021] magnesium sulfate  40 mEq Other To OR   mouth rinse  15 mL Mouth Rinse BID   pantoprazole  40 mg Oral Daily   [START ON 07/21/2021] phenylephrine  0-100 mcg/min Intravenous To OR   polyethylene glycol  17 g Oral Daily   [START ON 07/21/2021] potassium chloride  80 mEq Other To OR   sertraline  25 mg Oral  Daily   sodium chloride flush  3 mL Intravenous Q12H   sodium chloride flush  3 mL Intravenous Q12H   sodium chloride flush  3 mL Intravenous Q12H   spironolactone  25 mg Oral Daily   [START ON 07/21/2021] tranexamic acid  15 mg/kg Intravenous To OR   [START ON 07/21/2021] tranexamic acid  2 mg/kg Intracatheter To OR   traZODone  100 mg Oral QHS   [START ON 07/21/2021] vancomycin  1,000 mg Other To OR   [START ON 07/21/2021] vasopressin  0.04 Units/min Intravenous To OR    Infusions:  sodium chloride 20 mL/hr at 07/18/21 1021   sodium chloride     sodium chloride 20 mL/hr at 07/18/21 1020   sodium chloride Stopped (07/19/21 0518)   sodium chloride     amiodarone 30 mg/hr (07/20/21 0600)   [START ON 07/21/2021]  ceFAZolin (ANCEF) IV     [START ON 07/21/2021]  ceFAZolin (ANCEF) IV     [START ON 07/21/2021] dexmedetomidine     [START ON 07/21/2021] DOBUTamine     [START ON 07/21/2021] DOPamine     [START ON 07/21/2021] fluconazole (DIFLUCAN) IV     [START ON 07/21/2021] heparin 30,000 units/NS 1000 mL solution for CELLSAVER     heparin 2,050 Units/hr (07/20/21 0600)   magnesium sulfate bolus IVPB     milrinone 0.25 mcg/kg/min (07/20/21 0636)   [START ON 07/21/2021] milrinone     [START ON 07/21/2021] nitroGLYCERIN     [START ON 07/21/2021] norepinephrine (LEVOPHED) Adult infusion     [START ON 07/21/2021] rifampin (RIFADIN) IVPB     [START ON 07/21/2021] tranexamic acid (CYKLOKAPRON) infusion ([facility])     [START ON 07/21/2021] vancomycin      PRN Medications: sodium chloride, sodium chloride, sodium chloride, acetaminophen, ondansetron (ZOFRAN) IV, sodium chloride flush, sodium chloride flush   Assessment/Plan   1. Acute on Chronic systolic CHF - Nonischemic CM, end-stage - Echo (9/21): EF 20-25%.   - Echo  8/22: EF < 20%, severe LV dilation, moderate RV dysfunction.   - RHC (8/22): well compensated hemodynamics. - s/p S-ICD - NYHA III-IIIB  - Continue losartan - Continue  spiro 25 mg daily - Continue Jardiance 10 mg daily - No BB d/t recent shock. - Body mass index is 45.42 kg/m. Not transplant candidate - RHC 10/14 with elevated filling pressures and low CO. Started milrinone 0.25 for optimization. This morning, CI 2.1 with co-ox 80%. Continue milrinone. - CVP 9-10 with good  diuresis on IV Lasix but PA pressure and PCWP remain significantly elevated (pulmonary venous hypertension).  Continue Lasix 80 mg IV bid today given good response and replace K.   - Creatinine mildly lower 1.5.  - Plan for DT VAD on 10/17   2 Atrial fibrillation: Paroxysmal - Emergent TEE-guided DCCV 7/27 due to inability to control HR. - Recurrent AF on 8/5. s/p DC-CV on 05/10/21.  - Seen by EP. Not a candidate for ablation due to body habitus  (could consider AVN ablation and CRT, if needed)  - In NSR today - Continue IV amio to prevent recurrent VT on milrinone - IV heparin while off apixaban.    3. CKD stage 3B  - Has solitary kidney.   - Baseline creatinine is 1.6-2.9.  - Avoid hypotension. - Improving with milrinone support => 1.5 today.    4. OSA - Continue CPAP.    5. DM2. Poorly controlled - On Jardiance. - Hgb A1c 9.4. - Cover with SSI   6. Hematuria - MRI and cystoscopy ok   Can get out of bed to chair.   CRITICAL CARE Performed by: Loralie Champagne  Total critical care time: 40 minutes  Critical care time was exclusive of separately billable procedures and treating other patients.  Critical care was necessary to treat or prevent imminent or life-threatening deterioration.  Critical care was time spent personally by me on the following activities: development of treatment plan with patient and/or surrogate as well as nursing, discussions with consultants, evaluation of patient's response to treatment, examination of patient, obtaining history from patient or surrogate, ordering and performing treatments and interventions, ordering and review of laboratory  studies, ordering and review of radiographic studies, pulse oximetry and re-evaluation of patient's condition.   Length of Stay: 2  Loralie Champagne, MD  07/20/2021, 7:53 AM  Advanced Heart Failure Team Pager [phone] (M-F; 7a - 5p)  Please contact [facility] for night-coverage after hours (5p -7a ) and weekends on [email or web address]

## 2021-07-20 NOTE — Progress Notes (Signed)
Pre-op ABG x1 attempt drawn. Pt on room air at this time.

## 2021-07-20 NOTE — Plan of Care (Signed)

## 2021-07-20 NOTE — Progress Notes (Signed)
Pulled [place] back 4cm per Dr [name].

## 2021-07-20 NOTE — Progress Notes (Signed)
[name] for heparin Indication: atrial fibrillation  Allergies  Allergen Reactions   Aspirin Shortness Of Breath, Itching and Rash     Burning sensation (Patient reports he tolerates other NSAIDS)    Bee Venom Hives and Swelling   Lisinopril Cough   Shrimp (Diagnostic) Rash   Tomato Rash    Patient Measurements: Height: 5\' 7"  (170.2 cm) Weight: 125.9 kg (277 lb 9 oz) IBW/kg (Calculated) : 66.1 Heparin Dosing Weight: 97kg  Vital Signs: Temp: 97.5 F (36.4 C) (10/16 0700) Temp Source: Core (10/16 0400) BP: 103/77 (10/16 0700) Pulse Rate: 64 (10/16 0700)  Labs: Recent Labs    07/18/21 1006 07/19/21 0210 07/19/21 1001 07/19/21 1638 07/20/21 0001 07/20/21 0500  HGB 12.0* 12.5*  --   --  13.7  --   HCT 38.1* 38.9*  --   --  42.7  --   PLT 321 300  --   --  340  --   APTT 32  --   --   --   --   --   LABPROT  --   --   --   --   --  14.9  INR  --   --   --   --   --  1.2  HEPARINUNFRC  --  0.20* 0.52 0.56  --  0.73*  CREATININE 1.82* 1.77*  --   --  1.54*  --      Estimated Creatinine Clearance: 76.3 mL/min (A) (by C-G formula based on SCr of 1.54 mg/dL (H)).   Medical History: Past Medical History:  Diagnosis Date   Anxiety    Aspirin allergy    Childhood asthma    Chronic systolic CHF (congestive heart failure) ([place])    a. EF 20-25% in 2012. b. EF 45-50% in 10/2011 with nonischemic nuc - presumed NICM. c. 12/2014 Echo: Sev depressed LV fxn, sev dil LV, mild LVH, mild MR, sev dil LA, mildly reduced RV fxn.   CKD (chronic kidney disease) stage 2, GFR 60-89 ml/min    H/O vasectomy 12/2019   High cholesterol    Hypertension    Morbid obesity ([place])    Nephrolithiasis    OSA on CPAP    Paroxysmal atrial fibrillation ([place])    Presumed NICM    a. 04/2014 Myoview: EF 26%, glob HK, sev glob HK, ? prior infarct;  b. Never cathed 2/2 CKD.   Renal cell carcinoma ([place])    a. s/p Rt robotic assisted partial converted to radical  nephrectomy on 01/2013.   Troponin level elevated    a. 04/2014, 12/2014: felt due to CHF.   Type II diabetes mellitus (HCC)    Ventricular tachycardia    a. appropriate ICD therapy 12/2017   Assessment: 46 year old male with end stage heart failure, admitted post RHC with low cardiac output. Patient scheduled for LVAD 10/17. Patient with history of afib on apixaban prior to admission, will bridge with IV heparin until surgery. CBC appears stable from recent outpatient checks. No bleeding or hematoma noted post cath.   Heparin level supratherapeutic (0.73) on gtt at 2050 units/hr. No issues with line or bleeding reported per RN.  Goal of Therapy:  Heparin level 0.3-0.7 units/ml Monitor platelets by anticoagulation protocol: Yes   Plan:  Decrease heparin to 1950 units/hr Repeat heparin level in 6 hours Daily heparin level and CBC  Cathrine Muster, PharmD PGY2 Cardiology Pharmacy Resident Phone: [phone] 07/20/2021  8:06 AM  Please check  [email or web address] for unit-specific pharmacy phone numbers.

## 2021-07-20 NOTE — Progress Notes (Signed)
Home CPAP set up at bedside and pt states he will place when ready.

## 2021-07-21 ENCOUNTER — Inpatient Hospital Stay (HOSPITAL_COMMUNITY): Payer: Medicaid Other

## 2021-07-21 ENCOUNTER — Inpatient Hospital Stay (HOSPITAL_COMMUNITY): Admission: RE | Admit: 2021-07-21 | Payer: Medicaid Other | Source: Home / Self Care | Admitting: Surgery

## 2021-07-21 ENCOUNTER — Encounter (HOSPITAL_COMMUNITY): Payer: Self-pay | Admitting: Internal Medicine

## 2021-07-21 ENCOUNTER — Inpatient Hospital Stay (HOSPITAL_COMMUNITY)
Admission: RE | Disposition: A | Discharge status: Home Health | Payer: Self-pay | Source: Home / Self Care | Attending: Internal Medicine

## 2021-07-21 DIAGNOSIS — N179 Acute kidney failure, unspecified: Secondary | ICD-10-CM | POA: Diagnosis not present

## 2021-07-21 DIAGNOSIS — I5023 Acute on chronic systolic (congestive) heart failure: Secondary | ICD-10-CM | POA: Diagnosis not present

## 2021-07-21 DIAGNOSIS — I480 Paroxysmal atrial fibrillation: Secondary | ICD-10-CM | POA: Diagnosis not present

## 2021-07-21 DIAGNOSIS — E1165 Type 2 diabetes mellitus with hyperglycemia: Secondary | ICD-10-CM | POA: Diagnosis not present

## 2021-07-21 DIAGNOSIS — Z95811 Presence of heart assist device: Secondary | ICD-10-CM | POA: Diagnosis not present

## 2021-07-21 DIAGNOSIS — D72829 Elevated white blood cell count, unspecified: Secondary | ICD-10-CM | POA: Diagnosis not present

## 2021-07-21 DIAGNOSIS — I420 Dilated cardiomyopathy: Secondary | ICD-10-CM | POA: Diagnosis not present

## 2021-07-21 DIAGNOSIS — I5022 Chronic systolic (congestive) heart failure: Secondary | ICD-10-CM | POA: Diagnosis not present

## 2021-07-21 DIAGNOSIS — N1832 Chronic kidney disease, stage 3b: Secondary | ICD-10-CM | POA: Diagnosis not present

## 2021-07-21 DIAGNOSIS — I130 Hypertensive heart and chronic kidney disease with heart failure and stage 1 through stage 4 chronic kidney disease, or unspecified chronic kidney disease: Secondary | ICD-10-CM | POA: Diagnosis not present

## 2021-07-21 DIAGNOSIS — E119 Type 2 diabetes mellitus without complications: Secondary | ICD-10-CM | POA: Diagnosis not present

## 2021-07-21 DIAGNOSIS — R570 Cardiogenic shock: Secondary | ICD-10-CM | POA: Diagnosis not present

## 2021-07-21 DIAGNOSIS — N183 Chronic kidney disease, stage 3 unspecified: Secondary | ICD-10-CM | POA: Diagnosis not present

## 2021-07-21 DIAGNOSIS — E1122 Type 2 diabetes mellitus with diabetic chronic kidney disease: Secondary | ICD-10-CM | POA: Diagnosis not present

## 2021-07-21 DIAGNOSIS — I517 Cardiomegaly: Secondary | ICD-10-CM | POA: Diagnosis not present

## 2021-07-21 DIAGNOSIS — E872 Acidosis, unspecified: Secondary | ICD-10-CM | POA: Diagnosis not present

## 2021-07-21 DIAGNOSIS — I428 Other cardiomyopathies: Secondary | ICD-10-CM | POA: Diagnosis not present

## 2021-07-21 DIAGNOSIS — J811 Chronic pulmonary edema: Secondary | ICD-10-CM | POA: Diagnosis not present

## 2021-07-21 DIAGNOSIS — I272 Pulmonary hypertension, unspecified: Secondary | ICD-10-CM | POA: Diagnosis not present

## 2021-07-21 DIAGNOSIS — Z20822 Contact with and (suspected) exposure to covid-19: Secondary | ICD-10-CM | POA: Diagnosis not present

## 2021-07-21 DIAGNOSIS — Z6841 Body Mass Index (BMI) 40.0 and over, adult: Secondary | ICD-10-CM | POA: Diagnosis not present

## 2021-07-21 HISTORY — PX: INSERTION OF IMPLANTABLE LEFT VENTRICULAR ASSIST DEVICE: SHX5866

## 2021-07-21 HISTORY — PX: TEE WITHOUT CARDIOVERSION: SHX5443

## 2021-07-21 LAB — POCT I-STAT 7, (LYTES, BLD GAS, ICA,H+H)
Acid-Base Excess: 1 mmol/L (ref 0.0–2.0)
Acid-Base Excess: 0 mmol/L (ref 0.0–2.0)
Acid-Base Excess: 0 mmol/L (ref 0.0–2.0)
Acid-Base Excess: 1 mmol/L (ref 0.0–2.0)
Acid-base deficit: 1 mmol/L (ref 0.0–2.0)
Acid-base deficit: 1 mmol/L (ref 0.0–2.0)
Acid-base deficit: 4 mmol/L — ABNORMAL HIGH (ref 0.0–2.0)
Acid-base deficit: 3 mmol/L — ABNORMAL HIGH (ref 0.0–2.0)
Bicarbonate: 26.3 mmol/L (ref 20.0–28.0)
Bicarbonate: 24.8 mmol/L (ref 20.0–28.0)
Bicarbonate: 26.4 mmol/L (ref 20.0–28.0)
Bicarbonate: 25.2 mmol/L (ref 20.0–28.0)
Bicarbonate: 25.4 mmol/L (ref 20.0–28.0)
Bicarbonate: 24.8 mmol/L (ref 20.0–28.0)
Bicarbonate: 24.6 mmol/L (ref 20.0–28.0)
Bicarbonate: 22.4 mmol/L (ref 20.0–28.0)
Calcium, Ion: 1.36 mmol/L (ref 1.15–1.40)
Calcium, Ion: 1.16 mmol/L (ref 1.15–1.40)
Calcium, Ion: 1.23 mmol/L (ref 1.15–1.40)
Calcium, Ion: 1.11 mmol/L — ABNORMAL LOW (ref 1.15–1.40)
Calcium, Ion: 1.17 mmol/L (ref 1.15–1.40)
Calcium, Ion: 1.21 mmol/L (ref 1.15–1.40)
Calcium, Ion: 1.27 mmol/L (ref 1.15–1.40)
Calcium, Ion: 1.31 mmol/L (ref 1.15–1.40)
HCT: 46 % (ref 39.0–52.0)
HCT: 36 % — ABNORMAL LOW (ref 39.0–52.0)
HCT: 36 % — ABNORMAL LOW (ref 39.0–52.0)
HCT: 33 % — ABNORMAL LOW (ref 39.0–52.0)
HCT: 33 % — ABNORMAL LOW (ref 39.0–52.0)
HCT: 34 % — ABNORMAL LOW (ref 39.0–52.0)
HCT: 38 % — ABNORMAL LOW (ref 39.0–52.0)
HCT: 35 % — ABNORMAL LOW (ref 39.0–52.0)
Hemoglobin: 15.6 g/dL (ref 13.0–17.0)
Hemoglobin: 12.2 g/dL — ABNORMAL LOW (ref 13.0–17.0)
Hemoglobin: 12.2 g/dL — ABNORMAL LOW (ref 13.0–17.0)
Hemoglobin: 11.2 g/dL — ABNORMAL LOW (ref 13.0–17.0)
Hemoglobin: 11.2 g/dL — ABNORMAL LOW (ref 13.0–17.0)
Hemoglobin: 11.6 g/dL — ABNORMAL LOW (ref 13.0–17.0)
Hemoglobin: 12.9 g/dL — ABNORMAL LOW (ref 13.0–17.0)
Hemoglobin: 11.9 g/dL — ABNORMAL LOW (ref 13.0–17.0)
O2 Saturation: 100 %
O2 Saturation: 100 %
O2 Saturation: 100 %
O2 Saturation: 100 %
O2 Saturation: 100 %
O2 Saturation: 98 %
O2 Saturation: 98 %
O2 Saturation: 96 %
Patient temperature: 36.2
Patient temperature: 36.5
Potassium: 4.5 mmol/L (ref 3.5–5.1)
Potassium: 3.5 mmol/L (ref 3.5–5.1)
Potassium: 4.1 mmol/L (ref 3.5–5.1)
Potassium: 3.9 mmol/L (ref 3.5–5.1)
Potassium: 4.1 mmol/L (ref 3.5–5.1)
Potassium: 3.8 mmol/L (ref 3.5–5.1)
Potassium: 3.5 mmol/L (ref 3.5–5.1)
Potassium: 4.4 mmol/L (ref 3.5–5.1)
Sodium: 136 mmol/L (ref 135–145)
Sodium: 139 mmol/L (ref 135–145)
Sodium: 139 mmol/L (ref 135–145)
Sodium: 139 mmol/L (ref 135–145)
Sodium: 139 mmol/L (ref 135–145)
Sodium: 140 mmol/L (ref 135–145)
Sodium: 141 mmol/L (ref 135–145)
Sodium: 139 mmol/L (ref 135–145)
TCO2: 28 mmol/L (ref 22–32)
TCO2: 26 mmol/L (ref 22–32)
TCO2: 28 mmol/L (ref 22–32)
TCO2: 26 mmol/L (ref 22–32)
TCO2: 27 mmol/L (ref 22–32)
TCO2: 26 mmol/L (ref 22–32)
TCO2: 26 mmol/L (ref 22–32)
TCO2: 24 mmol/L (ref 22–32)
pCO2 arterial: 45 mmHg (ref 32.0–48.0)
pCO2 arterial: 42.4 mmHg (ref 32.0–48.0)
pCO2 arterial: 49.3 mmHg — ABNORMAL HIGH (ref 32.0–48.0)
pCO2 arterial: 39.6 mmHg (ref 32.0–48.0)
pCO2 arterial: 40.1 mmHg (ref 32.0–48.0)
pCO2 arterial: 44.6 mmHg (ref 32.0–48.0)
pCO2 arterial: 55.3 mmHg — ABNORMAL HIGH (ref 32.0–48.0)
pCO2 arterial: 39.7 mmHg (ref 32.0–48.0)
pH, Arterial: 7.374 (ref 7.350–7.450)
pH, Arterial: 7.337 — ABNORMAL LOW (ref 7.350–7.450)
pH, Arterial: 7.375 (ref 7.350–7.450)
pH, Arterial: 7.407 (ref 7.350–7.450)
pH, Arterial: 7.416 (ref 7.350–7.450)
pH, Arterial: 7.353 (ref 7.350–7.450)
pH, Arterial: 7.251 — ABNORMAL LOW (ref 7.350–7.450)
pH, Arterial: 7.358 (ref 7.350–7.450)
pO2, Arterial: 178 mmHg — ABNORMAL HIGH (ref 83.0–108.0)
pO2, Arterial: 308 mmHg — ABNORMAL HIGH (ref 83.0–108.0)
pO2, Arterial: 326 mmHg — ABNORMAL HIGH (ref 83.0–108.0)
pO2, Arterial: 228 mmHg — ABNORMAL HIGH (ref 83.0–108.0)
pO2, Arterial: 228 mmHg — ABNORMAL HIGH (ref 83.0–108.0)
pO2, Arterial: 105 mmHg (ref 83.0–108.0)
pO2, Arterial: 121 mmHg — ABNORMAL HIGH (ref 83.0–108.0)
pO2, Arterial: 86 mmHg (ref 83.0–108.0)

## 2021-07-21 LAB — POCT I-STAT, CHEM 8
BUN: 26 mg/dL — ABNORMAL HIGH (ref 6–20)
BUN: 25 mg/dL — ABNORMAL HIGH (ref 6–20)
BUN: 25 mg/dL — ABNORMAL HIGH (ref 6–20)
BUN: 25 mg/dL — ABNORMAL HIGH (ref 6–20)
BUN: 25 mg/dL — ABNORMAL HIGH (ref 6–20)
BUN: 26 mg/dL — ABNORMAL HIGH (ref 6–20)
Calcium, Ion: 1.38 mmol/L (ref 1.15–1.40)
Calcium, Ion: 1.31 mmol/L (ref 1.15–1.40)
Calcium, Ion: 1.25 mmol/L (ref 1.15–1.40)
Calcium, Ion: 1.16 mmol/L (ref 1.15–1.40)
Calcium, Ion: 1.12 mmol/L — ABNORMAL LOW (ref 1.15–1.40)
Calcium, Ion: 1.21 mmol/L (ref 1.15–1.40)
Chloride: 101 mmol/L (ref 98–111)
Chloride: 104 mmol/L (ref 98–111)
Chloride: 104 mmol/L (ref 98–111)
Chloride: 103 mmol/L (ref 98–111)
Chloride: 103 mmol/L (ref 98–111)
Chloride: 104 mmol/L (ref 98–111)
Creatinine, Ser: 1.5 mg/dL — ABNORMAL HIGH (ref 0.61–1.24)
Creatinine, Ser: 1.5 mg/dL — ABNORMAL HIGH (ref 0.61–1.24)
Creatinine, Ser: 1.6 mg/dL — ABNORMAL HIGH (ref 0.61–1.24)
Creatinine, Ser: 1.6 mg/dL — ABNORMAL HIGH (ref 0.61–1.24)
Creatinine, Ser: 1.5 mg/dL — ABNORMAL HIGH (ref 0.61–1.24)
Creatinine, Ser: 1.6 mg/dL — ABNORMAL HIGH (ref 0.61–1.24)
Glucose, Bld: 176 mg/dL — ABNORMAL HIGH (ref 70–99)
Glucose, Bld: 148 mg/dL — ABNORMAL HIGH (ref 70–99)
Glucose, Bld: 163 mg/dL — ABNORMAL HIGH (ref 70–99)
Glucose, Bld: 178 mg/dL — ABNORMAL HIGH (ref 70–99)
Glucose, Bld: 181 mg/dL — ABNORMAL HIGH (ref 70–99)
Glucose, Bld: 194 mg/dL — ABNORMAL HIGH (ref 70–99)
HCT: 45 % (ref 39.0–52.0)
HCT: 41 % (ref 39.0–52.0)
HCT: 38 % — ABNORMAL LOW (ref 39.0–52.0)
HCT: 33 % — ABNORMAL LOW (ref 39.0–52.0)
HCT: 34 % — ABNORMAL LOW (ref 39.0–52.0)
HCT: 34 % — ABNORMAL LOW (ref 39.0–52.0)
Hemoglobin: 15.3 g/dL (ref 13.0–17.0)
Hemoglobin: 13.9 g/dL (ref 13.0–17.0)
Hemoglobin: 12.9 g/dL — ABNORMAL LOW (ref 13.0–17.0)
Hemoglobin: 11.2 g/dL — ABNORMAL LOW (ref 13.0–17.0)
Hemoglobin: 11.6 g/dL — ABNORMAL LOW (ref 13.0–17.0)
Hemoglobin: 11.6 g/dL — ABNORMAL LOW (ref 13.0–17.0)
Potassium: 4.5 mmol/L (ref 3.5–5.1)
Potassium: 3.7 mmol/L (ref 3.5–5.1)
Potassium: 3.5 mmol/L (ref 3.5–5.1)
Potassium: 4.2 mmol/L (ref 3.5–5.1)
Potassium: 3.6 mmol/L (ref 3.5–5.1)
Potassium: 3.7 mmol/L (ref 3.5–5.1)
Sodium: 136 mmol/L (ref 135–145)
Sodium: 138 mmol/L (ref 135–145)
Sodium: 139 mmol/L (ref 135–145)
Sodium: 139 mmol/L (ref 135–145)
Sodium: 140 mmol/L (ref 135–145)
Sodium: 141 mmol/L (ref 135–145)
TCO2: 25 mmol/L (ref 22–32)
TCO2: 24 mmol/L (ref 22–32)
TCO2: 26 mmol/L (ref 22–32)
TCO2: 25 mmol/L (ref 22–32)
TCO2: 24 mmol/L (ref 22–32)
TCO2: 26 mmol/L (ref 22–32)

## 2021-07-21 LAB — CBC
HCT: 42.8 % (ref 39.0–52.0)
HCT: 36.5 % — ABNORMAL LOW (ref 39.0–52.0)
HCT: 36.3 % — ABNORMAL LOW (ref 39.0–52.0)
Hemoglobin: 13.6 g/dL (ref 13.0–17.0)
Hemoglobin: 11.1 g/dL — ABNORMAL LOW (ref 13.0–17.0)
Hemoglobin: 11.5 g/dL — ABNORMAL LOW (ref 13.0–17.0)
MCH: 24.7 pg — ABNORMAL LOW (ref 26.0–34.0)
MCH: 24.1 pg — ABNORMAL LOW (ref 26.0–34.0)
MCH: 24.7 pg — ABNORMAL LOW (ref 26.0–34.0)
MCHC: 31.8 g/dL (ref 30.0–36.0)
MCHC: 30.4 g/dL (ref 30.0–36.0)
MCHC: 31.7 g/dL (ref 30.0–36.0)
MCV: 77.8 fL — ABNORMAL LOW (ref 80.0–100.0)
MCV: 79.3 fL — ABNORMAL LOW (ref 80.0–100.0)
MCV: 77.9 fL — ABNORMAL LOW (ref 80.0–100.0)
Platelets: 327 10*3/uL (ref 150–400)
Platelets: 241 10*3/uL (ref 150–400)
Platelets: 249 10*3/uL (ref 150–400)
RBC: 5.5 MIL/uL (ref 4.22–5.81)
RBC: 4.6 MIL/uL (ref 4.22–5.81)
RBC: 4.66 MIL/uL (ref 4.22–5.81)
RDW: 15.8 % — ABNORMAL HIGH (ref 11.5–15.5)
RDW: 15.8 % — ABNORMAL HIGH (ref 11.5–15.5)
RDW: 15.7 % — ABNORMAL HIGH (ref 11.5–15.5)
WBC: 8.3 10*3/uL (ref 4.0–10.5)
WBC: 15.1 10*3/uL — ABNORMAL HIGH (ref 4.0–10.5)
WBC: 10.2 10*3/uL (ref 4.0–10.5)
nRBC: 0 % (ref 0.0–0.2)
nRBC: 0 % (ref 0.0–0.2)
nRBC: 0 % (ref 0.0–0.2)

## 2021-07-21 LAB — COOXEMETRY PANEL
Carboxyhemoglobin: 1.4 % (ref 0.5–1.5)
Carboxyhemoglobin: 1.4 % (ref 0.5–1.5)
Methemoglobin: 0.8 % (ref 0.0–1.5)
Methemoglobin: 1 % (ref 0.0–1.5)
O2 Saturation: 63.3 %
O2 Saturation: 70.3 %
Total hemoglobin: 13.6 g/dL (ref 12.0–16.0)
Total hemoglobin: 10.7 g/dL — ABNORMAL LOW (ref 12.0–16.0)

## 2021-07-21 LAB — FIBRINOGEN: Fibrinogen: 481 mg/dL — ABNORMAL HIGH (ref 210–475)

## 2021-07-21 LAB — POCT I-STAT EG7
Acid-base deficit: 1 mmol/L (ref 0.0–2.0)
Acid-base deficit: 1 mmol/L (ref 0.0–2.0)
Bicarbonate: 25.3 mmol/L (ref 20.0–28.0)
Bicarbonate: 25.3 mmol/L (ref 20.0–28.0)
Calcium, Ion: 1.22 mmol/L (ref 1.15–1.40)
Calcium, Ion: 1.11 mmol/L — ABNORMAL LOW (ref 1.15–1.40)
HCT: 36 % — ABNORMAL LOW (ref 39.0–52.0)
HCT: 32 % — ABNORMAL LOW (ref 39.0–52.0)
Hemoglobin: 12.2 g/dL — ABNORMAL LOW (ref 13.0–17.0)
Hemoglobin: 10.9 g/dL — ABNORMAL LOW (ref 13.0–17.0)
O2 Saturation: 81 %
O2 Saturation: 72 %
Patient temperature: 37.1
Potassium: 3.9 mmol/L (ref 3.5–5.1)
Potassium: 3.5 mmol/L (ref 3.5–5.1)
Sodium: 139 mmol/L (ref 135–145)
Sodium: 142 mmol/L (ref 135–145)
TCO2: 27 mmol/L (ref 22–32)
TCO2: 27 mmol/L (ref 22–32)
pCO2, Ven: 45.7 mmHg (ref 44.0–60.0)
pCO2, Ven: 49.6 mmHg (ref 44.0–60.0)
pH, Ven: 7.351 (ref 7.250–7.430)
pH, Ven: 7.317 (ref 7.250–7.430)
pO2, Ven: 47 mmHg — ABNORMAL HIGH (ref 32.0–45.0)
pO2, Ven: 42 mmHg (ref 32.0–45.0)

## 2021-07-21 LAB — HEMOGLOBIN AND HEMATOCRIT, BLOOD
HCT: 31.4 % — ABNORMAL LOW (ref 39.0–52.0)
Hemoglobin: 10.3 g/dL — ABNORMAL LOW (ref 13.0–17.0)

## 2021-07-21 LAB — GLUCOSE, CAPILLARY
Glucose-Capillary: 177 mg/dL — ABNORMAL HIGH (ref 70–99)
Glucose-Capillary: 157 mg/dL — ABNORMAL HIGH (ref 70–99)
Glucose-Capillary: 152 mg/dL — ABNORMAL HIGH (ref 70–99)
Glucose-Capillary: 148 mg/dL — ABNORMAL HIGH (ref 70–99)
Glucose-Capillary: 110 mg/dL — ABNORMAL HIGH (ref 70–99)
Glucose-Capillary: 137 mg/dL — ABNORMAL HIGH (ref 70–99)
Glucose-Capillary: 141 mg/dL — ABNORMAL HIGH (ref 70–99)
Glucose-Capillary: 143 mg/dL — ABNORMAL HIGH (ref 70–99)
Glucose-Capillary: 129 mg/dL — ABNORMAL HIGH (ref 70–99)
Glucose-Capillary: 111 mg/dL — ABNORMAL HIGH (ref 70–99)

## 2021-07-21 LAB — BASIC METABOLIC PANEL
Anion gap: 8 (ref 5–15)
Anion gap: 9 (ref 5–15)
BUN: 26 mg/dL — ABNORMAL HIGH (ref 6–20)
BUN: 23 mg/dL — ABNORMAL HIGH (ref 6–20)
CO2: 24 mmol/L (ref 22–32)
CO2: 24 mmol/L (ref 22–32)
Calcium: 9.8 mg/dL (ref 8.9–10.3)
Calcium: 9.1 mg/dL (ref 8.9–10.3)
Chloride: 100 mmol/L (ref 98–111)
Chloride: 106 mmol/L (ref 98–111)
Creatinine, Ser: 1.6 mg/dL — ABNORMAL HIGH (ref 0.61–1.24)
Creatinine, Ser: 1.73 mg/dL — ABNORMAL HIGH (ref 0.61–1.24)
GFR, Estimated: 53 mL/min — ABNORMAL LOW (ref >60)
GFR, Estimated: 49 mL/min — ABNORMAL LOW (ref >60)
Glucose, Bld: 209 mg/dL — ABNORMAL HIGH (ref 70–99)
Glucose, Bld: 190 mg/dL — ABNORMAL HIGH (ref 70–99)
Potassium: 4.3 mmol/L (ref 3.5–5.1)
Potassium: 3.6 mmol/L (ref 3.5–5.1)
Sodium: 132 mmol/L — ABNORMAL LOW (ref 135–145)
Sodium: 139 mmol/L (ref 135–145)

## 2021-07-21 LAB — DIC (DISSEMINATED INTRAVASCULAR COAGULATION)PANEL
D-Dimer, Quant: 0.96 ug/mL-FEU — ABNORMAL HIGH (ref 0.00–0.50)
Fibrinogen: 521 mg/dL — ABNORMAL HIGH (ref 210–475)
INR: 1.3 — ABNORMAL HIGH (ref 0.8–1.2)
Platelets: 239 10*3/uL (ref 150–400)
Prothrombin Time: 16 seconds — ABNORMAL HIGH (ref 11.4–15.2)
Smear Review: NONE SEEN
aPTT: 36 seconds (ref 24–36)

## 2021-07-21 LAB — HEPARIN LEVEL (UNFRACTIONATED): Heparin Unfractionated: 0.64 IU/mL (ref 0.30–0.70)

## 2021-07-21 LAB — MAGNESIUM
Magnesium: 2 mg/dL (ref 1.7–2.4)
Magnesium: 3 mg/dL — ABNORMAL HIGH (ref 1.7–2.4)

## 2021-07-21 LAB — CREATININE, SERUM
Creatinine, Ser: 1.59 mg/dL — ABNORMAL HIGH (ref 0.61–1.24)
GFR, Estimated: 54 mL/min — ABNORMAL LOW (ref >60)

## 2021-07-21 LAB — PLATELET COUNT: Platelets: 248 10*3/uL (ref 150–400)

## 2021-07-21 SURGERY — INSERTION OF IMPLANTABLE LEFT VENTRICULAR ASSIST DEVICE
Anesthesia: General | Site: Chest

## 2021-07-21 MED ORDER — DEXMEDETOMIDINE HCL IN NACL 400 MCG/100ML IV SOLN
0.4000 ug/kg/h | INTRAVENOUS | Status: DC
  Administered 2021-07-21 – 2021-07-22 (×5): 1.2 ug/kg/h via INTRAVENOUS
  Administered 2021-07-22: 1 ug/kg/h via INTRAVENOUS
  Administered 2021-07-22: 1.2 ug/kg/h via INTRAVENOUS
  Filled 2021-07-21: qty 100
  Filled 2021-07-21 (×2): qty 200

## 2021-07-21 MED ORDER — ACETAMINOPHEN 500 MG PO TABS
1000.0000 mg | ORAL_TABLET | Freq: Four times a day (QID) | ORAL | Status: AC
  Administered 2021-07-22 – 2021-07-26 (×17): 1000 mg via ORAL
  Filled 2021-07-21 (×18): qty 2

## 2021-07-21 MED ORDER — SODIUM CHLORIDE 0.9 % IV SOLN: INTRAVENOUS | Status: DC | PRN

## 2021-07-21 MED ORDER — SODIUM CHLORIDE 0.9% FLUSH: 3.0000 mL | INTRAVENOUS | Status: DC | PRN

## 2021-07-21 MED ORDER — SODIUM CHLORIDE (PF) 0.9 % IJ SOLN
INTRAMUSCULAR | Status: DC | PRN
  Administered 2021-07-21: 1000 mL via INTRAVENOUS

## 2021-07-21 MED ORDER — PROTAMINE SULFATE 10 MG/ML IV SOLN
INTRAVENOUS | Status: DC | PRN
  Administered 2021-07-21: 400 mg via INTRAVENOUS

## 2021-07-21 MED ORDER — THROMBIN 20000 UNITS EX SOLR
CUTANEOUS | Status: DC | PRN
Start: 2021-07-21 — End: 2021-07-21
  Administered 2021-07-21: 20000 [IU] via TOPICAL

## 2021-07-21 MED ORDER — FAMOTIDINE IN NACL 20-0.9 MG/50ML-% IV SOLN
20.0000 mg | Freq: Two times a day (BID) | INTRAVENOUS | Status: AC
  Administered 2021-07-21 (×2): 20 mg via INTRAVENOUS
  Filled 2021-07-21 (×2): qty 50

## 2021-07-21 MED ORDER — SODIUM CHLORIDE 0.9 % IV SOLN: INTRAVENOUS | Status: DC

## 2021-07-21 MED ORDER — ACETAMINOPHEN 650 MG RE SUPP
650.0000 mg | Freq: Once | RECTAL | Status: AC
  Administered 2021-07-21: 650 mg via RECTAL

## 2021-07-21 MED ORDER — THROMBIN (RECOMBINANT) 20000 UNITS EX SOLR
CUTANEOUS | Status: AC
  Filled 2021-07-21: qty 20000

## 2021-07-21 MED ORDER — SODIUM CHLORIDE 0.9 % IV SOLN
600.0000 mg | Freq: Once | INTRAVENOUS | Status: AC
  Administered 2021-07-22: 600 mg via INTRAVENOUS
  Filled 2021-07-21: qty 600

## 2021-07-21 MED ORDER — 0.9 % SODIUM CHLORIDE (POUR BTL) OPTIME
TOPICAL | Status: DC | PRN
  Administered 2021-07-21: 5000 mL

## 2021-07-21 MED ORDER — ALBUMIN HUMAN 25 % IV SOLN
12.5000 g | INTRAVENOUS | Status: DC | PRN
Start: 2021-07-21 — End: 2021-07-27
  Administered 2021-07-21: 12.5 g via INTRAVENOUS

## 2021-07-21 MED ORDER — VANCOMYCIN HCL IN DEXTROSE 1-5 GM/200ML-% IV SOLN
1000.0000 mg | Freq: Two times a day (BID) | INTRAVENOUS | Status: AC
Start: 2021-07-21 — End: 2021-07-22
  Administered 2021-07-21 – 2021-07-22 (×3): 1000 mg via INTRAVENOUS
  Filled 2021-07-21 (×3): qty 200

## 2021-07-21 MED ORDER — NOREPINEPHRINE 4 MG/250ML-% IV SOLN
0.0000 ug/min | INTRAVENOUS | Status: DC
  Administered 2021-07-22: 2 ug/min via INTRAVENOUS
  Filled 2021-07-21: qty 250

## 2021-07-21 MED ORDER — AMIODARONE HCL IN DEXTROSE 360-4.14 MG/200ML-% IV SOLN
30.0000 mg/h | INTRAVENOUS | Status: DC
  Administered 2021-07-21 – 2021-07-23 (×5): 30 mg/h via INTRAVENOUS
  Administered 2021-07-24: 60 mg/h via INTRAVENOUS
  Administered 2021-07-24: 30 mg/h via INTRAVENOUS
  Administered 2021-07-24 – 2021-07-28 (×13): 60 mg/h via INTRAVENOUS
  Administered 2021-07-28: 30 mg/h via INTRAVENOUS
  Administered 2021-07-28 (×2): 60 mg/h via INTRAVENOUS
  Administered 2021-07-29 – 2021-07-31 (×5): 30 mg/h via INTRAVENOUS
  Filled 2021-07-21 (×30): qty 200

## 2021-07-21 MED ORDER — OXYCODONE HCL 5 MG PO TABS
5.0000 mg | ORAL_TABLET | ORAL | Status: DC | PRN
  Administered 2021-07-22 – 2021-07-27 (×25): 10 mg via ORAL
  Administered 2021-07-27: 5 mg via ORAL
  Administered 2021-07-28 – 2021-08-08 (×39): 10 mg via ORAL
  Filled 2021-07-21 (×68): qty 2

## 2021-07-21 MED ORDER — MAGNESIUM SULFATE 4 GM/100ML IV SOLN
4.0000 g | Freq: Once | INTRAVENOUS | Status: AC
  Administered 2021-07-21: 4 g via INTRAVENOUS
  Filled 2021-07-21: qty 100

## 2021-07-21 MED ORDER — LACTATED RINGERS IV SOLN: INTRAVENOUS | Status: DC

## 2021-07-21 MED ORDER — FLUCONAZOLE IN SODIUM CHLORIDE 400-0.9 MG/200ML-% IV SOLN
400.0000 mg | Freq: Once | INTRAVENOUS | Status: AC
  Administered 2021-07-22: 400 mg via INTRAVENOUS
  Filled 2021-07-21 (×2): qty 200

## 2021-07-21 MED ORDER — ALBUMIN HUMAN 5 % IV SOLN
250.0000 mL | INTRAVENOUS | Status: DC | PRN
Start: 2021-07-21 — End: 2021-07-21

## 2021-07-21 MED ORDER — INSULIN REGULAR(HUMAN) IN NACL 100-0.9 UT/100ML-% IV SOLN
INTRAVENOUS | Status: DC
  Administered 2021-07-21: 3 [IU]/h via INTRAVENOUS
  Administered 2021-07-22: 6 [IU]/h via INTRAVENOUS
  Filled 2021-07-21 (×2): qty 100

## 2021-07-21 MED ORDER — CHLORHEXIDINE GLUCONATE CLOTH 2 % EX PADS
6.0000 | MEDICATED_PAD | Freq: Every day | CUTANEOUS | Status: DC
  Administered 2021-07-22 – 2021-07-26 (×5): 6 via TOPICAL

## 2021-07-21 MED ORDER — ACETAMINOPHEN 160 MG/5ML PO SOLN
1000.0000 mg | Freq: Four times a day (QID) | ORAL | Status: AC
  Administered 2021-07-21 – 2021-07-22 (×2): 1000 mg
  Filled 2021-07-21 (×2): qty 40.6

## 2021-07-21 MED ORDER — VANCOMYCIN HCL 1000 MG IV SOLR
INTRAVENOUS | Status: AC
  Filled 2021-07-21: qty 60

## 2021-07-21 MED ORDER — HEMOSTATIC AGENTS (NO CHARGE) OPTIME
TOPICAL | Status: DC | PRN
  Administered 2021-07-21: 1 via TOPICAL

## 2021-07-21 MED ORDER — ACETAMINOPHEN 160 MG/5ML PO SOLN: 650.0000 mg | Freq: Once | ORAL | Status: AC

## 2021-07-21 MED ORDER — PANTOPRAZOLE SODIUM 40 MG PO TBEC
40.0000 mg | DELAYED_RELEASE_TABLET | Freq: Every day | ORAL | Status: DC
  Administered 2021-07-23 – 2021-08-08 (×17): 40 mg via ORAL
  Filled 2021-07-21 (×17): qty 1

## 2021-07-21 MED ORDER — BISACODYL 10 MG RE SUPP: 10.0000 mg | Freq: Every day | RECTAL | Status: DC

## 2021-07-21 MED ORDER — TRAZODONE HCL 50 MG PO TABS
100.0000 mg | ORAL_TABLET | Freq: Every day | ORAL | Status: DC
  Administered 2021-07-21 – 2021-07-31 (×11): 100 mg
  Filled 2021-07-21 (×10): qty 2

## 2021-07-21 MED ORDER — CHLORHEXIDINE GLUCONATE 0.12 % MT SOLN
15.0000 mL | OROMUCOSAL | Status: AC
  Administered 2021-07-21: 15 mL via OROMUCOSAL

## 2021-07-21 MED ORDER — THROMBIN 5000 UNITS EX SOLR
OROMUCOSAL | Status: DC | PRN
  Administered 2021-07-21 (×3): 4 mL via TOPICAL

## 2021-07-21 MED ORDER — MIDAZOLAM HCL 5 MG/5ML IJ SOLN
INTRAMUSCULAR | Status: DC | PRN
  Administered 2021-07-21: 3 mg via INTRAVENOUS
  Administered 2021-07-21: 2 mg via INTRAVENOUS
  Administered 2021-07-21 (×2): 1 mg via INTRAVENOUS
  Administered 2021-07-21: 2 mg via INTRAVENOUS
  Administered 2021-07-21: 1 mg via INTRAVENOUS

## 2021-07-21 MED ORDER — HEPARIN 6000 UNIT IRRIGATION SOLUTION
Status: AC
  Filled 2021-07-21: qty 500

## 2021-07-21 MED ORDER — MIDAZOLAM HCL 2 MG/2ML IJ SOLN
2.0000 mg | INTRAMUSCULAR | Status: DC | PRN
  Administered 2021-07-21 – 2021-07-22 (×7): 2 mg via INTRAVENOUS
  Filled 2021-07-21 (×7): qty 2

## 2021-07-21 MED ORDER — DEXTROSE 50 % IV SOLN: 0.0000 mL | INTRAVENOUS | Status: DC | PRN

## 2021-07-21 MED ORDER — CHLORHEXIDINE GLUCONATE 0.12% ORAL RINSE (MEDLINE KIT)
15.0000 mL | Freq: Two times a day (BID) | OROMUCOSAL | Status: DC
  Administered 2021-07-21 – 2021-08-07 (×21): 15 mL via OROMUCOSAL

## 2021-07-21 MED ORDER — TRAMADOL HCL 50 MG PO TABS
50.0000 mg | ORAL_TABLET | ORAL | Status: DC | PRN
  Administered 2021-07-22 – 2021-07-26 (×8): 100 mg via ORAL
  Administered 2021-07-27: 50 mg via ORAL
  Administered 2021-07-27 – 2021-08-01 (×16): 100 mg via ORAL
  Filled 2021-07-21 (×25): qty 2

## 2021-07-21 MED ORDER — SODIUM CHLORIDE 0.9% FLUSH
3.0000 mL | Freq: Two times a day (BID) | INTRAVENOUS | Status: DC
  Administered 2021-07-22 – 2021-08-06 (×16): 3 mL via INTRAVENOUS

## 2021-07-21 MED ORDER — AMIODARONE HCL IN DEXTROSE 360-4.14 MG/200ML-% IV SOLN: 60.0000 mg/h | INTRAVENOUS | Status: AC

## 2021-07-21 MED ORDER — ORAL CARE MOUTH RINSE
15.0000 mL | OROMUCOSAL | Status: DC
  Administered 2021-07-21 – 2021-07-22 (×8): 15 mL via OROMUCOSAL

## 2021-07-21 MED ORDER — BISACODYL 5 MG PO TBEC
10.0000 mg | DELAYED_RELEASE_TABLET | Freq: Every day | ORAL | Status: DC
Start: 2021-07-22 — End: 2021-07-29
  Administered 2021-07-23 – 2021-07-29 (×7): 10 mg via ORAL
  Filled 2021-07-21 (×7): qty 2

## 2021-07-21 MED ORDER — POTASSIUM CHLORIDE 10 MEQ/50ML IV SOLN
10.0000 meq | INTRAVENOUS | Status: AC
  Administered 2021-07-21 (×3): 10 meq via INTRAVENOUS

## 2021-07-21 MED ORDER — ROCURONIUM BROMIDE 10 MG/ML (PF) SYRINGE
PREFILLED_SYRINGE | INTRAVENOUS | Status: DC | PRN
  Administered 2021-07-21 (×2): 50 mg via INTRAVENOUS
  Administered 2021-07-21: 100 mg via INTRAVENOUS

## 2021-07-21 MED ORDER — AMIODARONE HCL IN DEXTROSE 360-4.14 MG/200ML-% IV SOLN: 30.0000 mg/h | INTRAVENOUS | Status: DC

## 2021-07-21 MED ORDER — ONDANSETRON HCL 4 MG/2ML IJ SOLN
4.0000 mg | Freq: Four times a day (QID) | INTRAMUSCULAR | Status: DC | PRN
  Administered 2021-07-22 – 2021-07-30 (×2): 4 mg via INTRAVENOUS
  Filled 2021-07-21 (×2): qty 2

## 2021-07-21 MED ORDER — HEPARIN SODIUM (PORCINE) 1000 UNIT/ML IJ SOLN
INTRAMUSCULAR | Status: DC | PRN
  Administered 2021-07-21: 47000 [IU] via INTRAVENOUS

## 2021-07-21 MED ORDER — MORPHINE SULFATE (PF) 2 MG/ML IV SOLN
1.0000 mg | INTRAVENOUS | Status: DC | PRN
  Administered 2021-07-21: 4 mg via INTRAVENOUS
  Administered 2021-07-21 (×2): 2 mg via INTRAVENOUS
  Administered 2021-07-21 (×2): 4 mg via INTRAVENOUS
  Administered 2021-07-22 (×3): 2 mg via INTRAVENOUS
  Administered 2021-07-22 (×2): 4 mg via INTRAVENOUS
  Administered 2021-07-22 (×2): 2 mg via INTRAVENOUS
  Administered 2021-07-23 – 2021-07-29 (×64): 4 mg via INTRAVENOUS
  Filled 2021-07-21 (×7): qty 2
  Filled 2021-07-21: qty 1
  Filled 2021-07-21 (×14): qty 2
  Filled 2021-07-21: qty 1
  Filled 2021-07-21 (×4): qty 2
  Filled 2021-07-21: qty 1
  Filled 2021-07-21: qty 2
  Filled 2021-07-21: qty 1
  Filled 2021-07-21 (×7): qty 2
  Filled 2021-07-21: qty 1
  Filled 2021-07-21 (×8): qty 2
  Filled 2021-07-21: qty 1
  Filled 2021-07-21 (×4): qty 2
  Filled 2021-07-21: qty 1
  Filled 2021-07-21 (×9): qty 2
  Filled 2021-07-21: qty 1
  Filled 2021-07-21 (×2): qty 2
  Filled 2021-07-21: qty 1
  Filled 2021-07-21 (×13): qty 2

## 2021-07-21 MED ORDER — SODIUM CHLORIDE 0.45 % IV SOLN: INTRAVENOUS | Status: DC | PRN

## 2021-07-21 MED ORDER — FENTANYL CITRATE (PF) 250 MCG/5ML IJ SOLN
INTRAMUSCULAR | Status: DC | PRN
  Administered 2021-07-21: 50 ug via INTRAVENOUS
  Administered 2021-07-21: 200 ug via INTRAVENOUS
  Administered 2021-07-21: 250 ug via INTRAVENOUS
  Administered 2021-07-21: 50 ug via INTRAVENOUS
  Administered 2021-07-21: 100 ug via INTRAVENOUS
  Administered 2021-07-21: 200 ug via INTRAVENOUS
  Administered 2021-07-21: 50 ug via INTRAVENOUS
  Administered 2021-07-21: 100 ug via INTRAVENOUS

## 2021-07-21 MED ORDER — DEXMEDETOMIDINE HCL IN NACL 400 MCG/100ML IV SOLN
0.0000 ug/kg/h | INTRAVENOUS | Status: DC
  Filled 2021-07-21: qty 200

## 2021-07-21 MED ORDER — DOCUSATE SODIUM 100 MG PO CAPS
200.0000 mg | ORAL_CAPSULE | Freq: Every day | ORAL | Status: DC
  Administered 2021-07-23 – 2021-08-01 (×10): 200 mg via ORAL
  Filled 2021-07-21 (×13): qty 2

## 2021-07-21 MED ORDER — PROPOFOL 10 MG/ML IV BOLUS
INTRAVENOUS | Status: DC | PRN
  Administered 2021-07-21: 40 mg via INTRAVENOUS
  Administered 2021-07-21: 30 mg via INTRAVENOUS
  Administered 2021-07-21: 50 mg via INTRAVENOUS
  Administered 2021-07-21: 20 mg via INTRAVENOUS

## 2021-07-21 MED ORDER — SODIUM CHLORIDE 0.9 % IV BOLUS: 250.0000 mL | INTRAVENOUS | Status: DC | PRN

## 2021-07-21 MED ORDER — CEFAZOLIN SODIUM-DEXTROSE 2-4 GM/100ML-% IV SOLN
2.0000 g | Freq: Three times a day (TID) | INTRAVENOUS | Status: AC
  Administered 2021-07-21 – 2021-07-23 (×6): 2 g via INTRAVENOUS
  Filled 2021-07-21 (×6): qty 100

## 2021-07-21 MED ORDER — LACTATED RINGERS IV SOLN: 500.0000 mL | Freq: Once | INTRAVENOUS | Status: DC | PRN

## 2021-07-21 MED ORDER — CHLORHEXIDINE GLUCONATE CLOTH 2 % EX PADS: 6.0000 | MEDICATED_PAD | Freq: Every day | CUTANEOUS | Status: DC

## 2021-07-21 MED ORDER — HEPARIN 6000 UNIT IRRIGATION SOLUTION
Status: DC | PRN
  Administered 2021-07-21: 1

## 2021-07-21 MED ORDER — PHENYLEPHRINE 40 MCG/ML (10ML) SYRINGE FOR IV PUSH (FOR BLOOD PRESSURE SUPPORT)
PREFILLED_SYRINGE | INTRAVENOUS | Status: DC | PRN
  Administered 2021-07-21: 80 ug via INTRAVENOUS
  Administered 2021-07-21: 120 ug via INTRAVENOUS
  Administered 2021-07-21: 80 ug via INTRAVENOUS
  Administered 2021-07-21: 120 ug via INTRAVENOUS

## 2021-07-21 MED ORDER — SODIUM CHLORIDE 0.9 % IV SOLN
250.0000 mL | INTRAVENOUS | Status: DC
Start: 2021-07-22 — End: 2021-07-21

## 2021-07-21 MED ORDER — EPINEPHRINE HCL 5 MG/250ML IV SOLN IN NS
2.0000 ug/min | INTRAVENOUS | Status: DC
  Administered 2021-07-22: 2 ug/min via INTRAVENOUS
  Filled 2021-07-21: qty 250

## 2021-07-21 MED ORDER — VANCOMYCIN HCL 1000 MG IV SOLR
INTRAVENOUS | Status: DC | PRN
  Administered 2021-07-21: 1000 mg via TOPICAL

## 2021-07-21 MED FILL — Heparin Sod (Porcine)-NaCl IV Soln 1000 Unit/500ML-0.9%: INTRAVENOUS | Qty: 500 | Status: AC

## 2021-07-21 SURGICAL SUPPLY — 118 items
ADAPTER CARDIO PERF ANTE/RETRO (ADAPTER) IMPLANT
ADPR PRFSN 84XANTGRD RTRGD (ADAPTER)
APL PRP STRL LF DISP 70% ISPRP (MISCELLANEOUS)
APPLICATOR CHLORAPREP 3ML ORNG (MISCELLANEOUS) ×3 IMPLANT
BAG DECANTER FOR FLEXI CONT (MISCELLANEOUS) ×5 IMPLANT
BLADE CLIPPER SURG (BLADE) ×4 IMPLANT
BLADE STERNUM SYSTEM 6 (BLADE) ×4 IMPLANT
BLADE SURG 10 STRL SS (BLADE) IMPLANT
BLADE SURG 12 STRL SS (BLADE) ×4 IMPLANT
BLADE SURG 15 STRL LF DISP TIS (BLADE) IMPLANT
BLADE SURG 15 STRL SS (BLADE)
CANISTER SUCT 3000ML PPV (MISCELLANEOUS) ×4 IMPLANT
CANNULA ARTERIAL NVNT 3/8 20FR (MISCELLANEOUS) ×4 IMPLANT
CANNULA ARTERIAL NVNT 3/8 22FR (MISCELLANEOUS) ×1 IMPLANT
CANNULA MC2 2 STG 36/46 NON-V (CANNULA) IMPLANT
CANNULA SUMP PERICARDIAL (CANNULA) ×4 IMPLANT
CANNULA VENOUS 2 STG 34/46 (CANNULA) ×4
CANNULA VENOUS LOW PROF 34X46 (CANNULA) ×4 IMPLANT
CATH COUDE FOLEY 2W 5CC 16FR (CATHETERS) ×1 IMPLANT
CATH FOLEY 2WAY SLVR  5CC 14FR (CATHETERS) ×4
CATH FOLEY 2WAY SLVR 5CC 14FR (CATHETERS) ×3 IMPLANT
CATH HEART VENT LEFT (CATHETERS) IMPLANT
CATH ROBINSON RED A/P 18FR (CATHETERS) ×8 IMPLANT
CATH THORACIC 28FR (CATHETERS) ×1 IMPLANT
CATH THORACIC 36FR RT ANG (CATHETERS) ×1 IMPLANT
CHLORAPREP W/TINT 26 (MISCELLANEOUS) ×3 IMPLANT
CNTNR URN SCR LID CUP LEK RST (MISCELLANEOUS) IMPLANT
CONN ST 1/4X3/8  BEN (MISCELLANEOUS) ×4
CONN ST 1/4X3/8 BEN (MISCELLANEOUS) IMPLANT
CONN Y 3/8X3/8X3/8  BEN (MISCELLANEOUS) ×4
CONN Y 3/8X3/8X3/8 BEN (MISCELLANEOUS) IMPLANT
CONT SPEC 4OZ STRL OR WHT (MISCELLANEOUS) ×4
CONTAINER PROTECT SURGISLUSH (MISCELLANEOUS) ×1 IMPLANT
DRAIN CHANNEL 28F RND 3/8 FF (WOUND CARE) IMPLANT
DRAIN CHANNEL 32F RND 10.7 FF (WOUND CARE) ×1 IMPLANT
DRAPE HALF SHEET 40X57 (DRAPES) ×1 IMPLANT
DRAPE INCISE IOBAN 66X45 STRL (DRAPES) ×9 IMPLANT
DRAPE PERI GROIN 82X75IN TIB (DRAPES) ×4 IMPLANT
DRAPE TABLE BACK 80X90 (DRAPES) ×1 IMPLANT
DRAPE WARM FLUID 44X44 (DRAPES) ×4 IMPLANT
DRSG COVADERM 4X14 (GAUZE/BANDAGES/DRESSINGS) ×4 IMPLANT
DRSG COVADERM 4X6 (GAUZE/BANDAGES/DRESSINGS) ×2 IMPLANT
ELECT BLADE 4.0 EZ CLEAN MEGAD (MISCELLANEOUS) ×4
ELECT BLADE 6.5 EXT (BLADE) ×5 IMPLANT
ELECT CAUTERY BLADE 6.4 (BLADE) ×4 IMPLANT
ELECT REM PT RETURN 9FT ADLT (ELECTROSURGICAL) ×12
ELECTRODE BLDE 4.0 EZ CLN MEGD (MISCELLANEOUS) ×3 IMPLANT
ELECTRODE REM PT RTRN 9FT ADLT (ELECTROSURGICAL) ×9 IMPLANT
FELT TEFLON 6X6 (MISCELLANEOUS) ×4 IMPLANT
GAUZE 4X4 16PLY ~~LOC~~+RFID DBL (SPONGE) ×1 IMPLANT
GAUZE SPONGE 4X4 12PLY STRL (GAUZE/BANDAGES/DRESSINGS) ×8 IMPLANT
GAUZE SPONGE 4X4 12PLY STRL LF (GAUZE/BANDAGES/DRESSINGS) ×2 IMPLANT
GLOVE SURG ENC TEXT LTX SZ6.5 (GLOVE) ×4 IMPLANT
GLOVE SURG MICRO LTX SZ7 (GLOVE) ×2 IMPLANT
GOWN STRL REUS W/ TWL LRG LVL3 (GOWN DISPOSABLE) ×12 IMPLANT
GOWN STRL REUS W/ TWL XL LVL3 (GOWN DISPOSABLE) ×6 IMPLANT
GOWN STRL REUS W/TWL LRG LVL3 (GOWN DISPOSABLE) ×16
GOWN STRL REUS W/TWL XL LVL3 (GOWN DISPOSABLE) ×8
HEMOSTAT POWDER SURGIFOAM 1G (HEMOSTASIS) ×15 IMPLANT
HEMOSTAT SURGICEL 2X14 (HEMOSTASIS) ×4 IMPLANT
INSERT FOGARTY XLG (MISCELLANEOUS) IMPLANT
KIT BASIN OR (CUSTOM PROCEDURE TRAY) ×4 IMPLANT
KIT LVAD HEARTMATE 3 W-CNTRL (Prosthesis & Implant Heart) IMPLANT
KIT LVAD HEARTMATE III W-CNTRL (Prosthesis & Implant Heart) ×1 IMPLANT
KIT SUCTION CATH 14FR (SUCTIONS) ×4 IMPLANT
KIT TURNOVER KIT B (KITS) ×4 IMPLANT
LEAD PACING MYOCARDI (MISCELLANEOUS) IMPLANT
LINE VENT (MISCELLANEOUS) ×1 IMPLANT
NS IRRIG 1000ML POUR BTL (IV SOLUTION) ×19 IMPLANT
PACK OPEN HEART (CUSTOM PROCEDURE TRAY) ×4 IMPLANT
PAD ARMBOARD 7.5X6 YLW CONV (MISCELLANEOUS) ×8 IMPLANT
PAD DEFIB R2 (MISCELLANEOUS) ×4 IMPLANT
PENCIL BUTTON HOLSTER BLD 10FT (ELECTRODE) ×1 IMPLANT
POSITIONER HEAD DONUT 9IN (MISCELLANEOUS) ×4 IMPLANT
PUNCH AORTIC ROTATE 4.5MM 8IN (MISCELLANEOUS) ×4 IMPLANT
SEALANT SURG COSEAL 8ML (VASCULAR PRODUCTS) ×4 IMPLANT
SET MPS 3-ND DEL (MISCELLANEOUS) ×1 IMPLANT
SHEATH AVANTI 11CM 5FR (SHEATH) IMPLANT
SPONGE T-LAP 18X18 ~~LOC~~+RFID (SPONGE) ×4 IMPLANT
SPONGE T-LAP 4X18 ~~LOC~~+RFID (SPONGE) ×1 IMPLANT
SUT ETHIBOND 2 0 SH (SUTURE) ×20
SUT ETHIBOND 2 0 SH 36X2 (SUTURE) ×15 IMPLANT
SUT ETHIBOND NAB MH 2-0 36IN (SUTURE) ×48 IMPLANT
SUT ETHILON 3 0 FSL (SUTURE) ×1 IMPLANT
SUT PROLENE 3 0 SH DA (SUTURE) ×8 IMPLANT
SUT PROLENE 4 0 RB 1 (SUTURE) ×16
SUT PROLENE 4-0 RB1 .5 CRCL 36 (SUTURE) ×12 IMPLANT
SUT PROLENE 5 0 C 1 36 (SUTURE) ×2 IMPLANT
SUT PROLENE 5 0 C1 (SUTURE) IMPLANT
SUT PROLENE 6 0 C 1 30 (SUTURE) ×2 IMPLANT
SUT SILK  1 MH (SUTURE) ×16
SUT SILK 1 MH (SUTURE) ×12 IMPLANT
SUT SILK 1 TIES 10X30 (SUTURE) ×4 IMPLANT
SUT SILK 2 0 SH CR/8 (SUTURE) ×8 IMPLANT
SUT STEEL 6MS V (SUTURE) ×6 IMPLANT
SUT STEEL STERNAL CCS#1 18IN (SUTURE) ×1 IMPLANT
SUT STEEL SZ 6 DBL 3X14 BALL (SUTURE) ×5 IMPLANT
SUT TEM PAC WIRE 2 0 SH (SUTURE) IMPLANT
SUT VIC AB 1 CTX 36 (SUTURE) ×8
SUT VIC AB 1 CTX36XBRD ANBCTR (SUTURE) ×6 IMPLANT
SUT VIC AB 2-0 CT1 27 (SUTURE) ×4
SUT VIC AB 2-0 CT1 TAPERPNT 27 (SUTURE) IMPLANT
SUT VIC AB 2-0 CTX 27 (SUTURE) ×8 IMPLANT
SUT VIC AB 3-0 SH 8-18 (SUTURE) ×5 IMPLANT
SUT VIC AB 3-0 X1 27 (SUTURE) ×8 IMPLANT
SYR 50ML LL SCALE MARK (SYRINGE) ×4 IMPLANT
SYSTEM SAHARA CHEST DRAIN ATS (WOUND CARE) ×4 IMPLANT
TAPE CLOTH SURG 4X10 WHT LF (GAUZE/BANDAGES/DRESSINGS) ×2 IMPLANT
TAPE PAPER 2X10 WHT MICROPORE (GAUZE/BANDAGES/DRESSINGS) ×1 IMPLANT
TOWEL GREEN STERILE (TOWEL DISPOSABLE) ×4 IMPLANT
TRAY CATH LUMEN 1 20CM STRL (SET/KITS/TRAYS/PACK) ×4 IMPLANT
TRAY FOLEY SLVR 16FR TEMP STAT (SET/KITS/TRAYS/PACK) ×4 IMPLANT
TUBE CONNECTING 12X1/4 (SUCTIONS) ×4 IMPLANT
TUBE SUCT INTRACARD DLP 20F (MISCELLANEOUS) ×1 IMPLANT
UNDERPAD 30X36 HEAVY ABSORB (UNDERPADS AND DIAPERS) ×4 IMPLANT
VENT LEFT HEART 12002 (CATHETERS)
WATER STERILE IRR 1000ML POUR (IV SOLUTION) ×8 IMPLANT
YANKAUER SUCT BULB TIP NO VENT (SUCTIONS) ×4 IMPLANT

## 2021-07-21 NOTE — Plan of Care (Signed)
  Problem: Education: Goal: Knowledge of General Education information will improve Description: Including pain rating scale, medication(s)/side effects and non-pharmacologic comfort measures Outcome: Progressing   Problem: Health Behavior/Discharge Planning: Goal: Ability to manage health-related needs will improve Outcome: Progressing   Problem: Clinical Measurements: Goal: Ability to maintain clinical measurements within normal limits will improve Outcome: Progressing Goal: Will remain free from infection Outcome: Progressing Goal: Diagnostic test results will improve Outcome: Progressing Goal: Respiratory complications will improve Outcome: Progressing Goal: Cardiovascular complication will be avoided Outcome: Progressing   Problem: Activity: Goal: Risk for activity intolerance will decrease Outcome: Progressing   Problem: Nutrition: Goal: Adequate nutrition will be maintained Outcome: Progressing   Problem: Coping: Goal: Level of anxiety will decrease Outcome: Progressing   Problem: Elimination: Goal: Will not experience complications related to bowel motility Outcome: Progressing Goal: Will not experience complications related to urinary retention Outcome: Progressing   Problem: Pain Managment: Goal: General experience of comfort will improve Outcome: Progressing   Problem: Safety: Goal: Ability to remain free from injury will improve Outcome: Progressing   Problem: Skin Integrity: Goal: Risk for impaired skin integrity will decrease Outcome: Progressing   Problem: Education: Goal: Knowledge of the prescribed therapeutic regimen will improve Outcome: Progressing   Problem: Activity: Goal: Risk for activity intolerance will decrease Outcome: Progressing   Problem: Cardiac: Goal: Ability to maintain an adequate cardiac output will improve Outcome: Progressing   Problem: Coping: Goal: Level of anxiety will decrease Outcome: Progressing   Problem:  Fluid Volume: Goal: Risk for excess fluid volume will decrease Outcome: Progressing   Problem: Clinical Measurements: Goal: Ability to maintain clinical measurements within normal limits will improve Outcome: Progressing Goal: Will remain free from infection Outcome: Progressing   Problem: Respiratory: Goal: Will regain and/or maintain adequate ventilation Outcome: Progressing

## 2021-07-21 NOTE — Anesthesia Preprocedure Evaluation (Signed)
Anesthesia Evaluation  Patient identified by MRN, date of birth, ID band Patient awake    Reviewed: Allergy & Precautions, NPO status , Patient's Chart, lab work & pertinent test results  History of Anesthesia Complications Negative for: history of anesthetic complications  Airway Mallampati: IV  TM Distance: >3 FB Neck ROM: Full    Dental  (+) Dental Advisory Given, Teeth Intact   Pulmonary shortness of breath, asthma , sleep apnea , former smoker,    breath sounds clear to auscultation       Cardiovascular hypertension, Pt. on medications + Past MI and +CHF  + Cardiac Defibrillator  Rhythm:Regular  1. Left ventricular ejection fraction, by estimation, is <20%. The left  ventricle has severely decreased function. The left ventricle demonstrates  global hypokinesis. The left ventricular internal cavity size was severely  dilated. Left ventricular  diastolic parameters are consistent with Grade III diastolic dysfunction  (restrictive). Elevated left atrial pressure.  2. Right ventricular systolic function is moderately reduced. The right  ventricular size is normal. There is mildly elevated pulmonary artery  systolic pressure. The estimated right ventricular systolic pressure is  19.1 mmHg.  3. Left atrial size was severely dilated.  4. The mitral valve is normal in structure. Trivial mitral valve  regurgitation.  5. The aortic valve is tricuspid. Aortic valve regurgitation is not  visualized. No aortic stenosis is present.  6. The inferior vena cava is dilated in size with <50% respiratory  variability, suggesting right atrial pressure of 15 mmHg.    Ao = 118/83 (95) RA = 15 RV = 69/20 PA = 72/35 (48)  PCW = 25 Fick cardiac output/index = 4.6/1.9 Thermo CO/CI = 6.2/2.6  PVR = 5.0 (Fick) 3.7 (Thermo) FA sat = 91% PA sat = 50%, 50% PAPi = 2.5  Assessment: 1. Moderately elevated biventricular pressures with  moderately to severely decreased cardiac index  Plan/Discussion:  Admit to ICU. Start milrinone. Plan VAD Monday once optimized.     Neuro/Psych PSYCHIATRIC DISORDERS Anxiety negative neurological ROS     GI/Hepatic negative GI ROS, Neg liver ROS,   Endo/Other  diabetes, Insulin DependentMorbid obesity  Renal/GU CRFRenal diseaseLab Results      Component                Value               Date                      CREATININE               1.50 (H)            07/21/2021           Lab Results      Component                Value               Date                      K                        4.5                 07/21/2021                Musculoskeletal  (+) Arthritis ,   Abdominal   Peds  Hematology  (+)  anemia , Lab Results      Component                Value               Date                      WBC                      8.3                 07/21/2021                HGB                      15.6                07/21/2021                HCT                      46.0                07/21/2021                MCV                      77.8 (L)            07/21/2021                PLT                      327                 07/21/2021              Anesthesia Other Findings   Reproductive/Obstetrics                             Anesthesia Physical Anesthesia Plan  ASA: 4  Anesthesia Plan: General   Post-op Pain Management:    Induction: Intravenous  PONV Risk Score and Plan: 2 and Treatment may vary due to age or medical condition  Airway Management Planned: Oral ETT  Additional Equipment: Arterial line, CVP, PA Cath, TEE and Ultrasound Guidance Line Placement  Intra-op Plan:   Post-operative Plan: Post-operative intubation/ventilation  Informed Consent: I have reviewed the patients History and Physical, chart, labs and discussed the procedure including the risks, benefits and alternatives for the proposed anesthesia with the  patient or authorized representative who has indicated his/her understanding and acceptance.     Dental advisory given  Plan Discussed with: Anesthesiologist and CRNA  Anesthesia Plan Comments:         Anesthesia Quick Evaluation

## 2021-07-21 NOTE — Progress Notes (Signed)
Pharmacy Antibiotic Note  Jeffrey [name] is a 45 y.o. male admitted on 07/18/2021 for LVAD placement. LVAD was placed 10/17.  Pharmacy has been consulted to monitor postoperative vancomycin and cefazolin dosing. Patient received rifampin and fluconazole in OR.  WBC elevated post VAD placement. Renal function is currently stable. Patient is afebrile. Current antibiotic dosing is appropriate. Will continue to monitor and check vancomycin level pending treatment duration.  Plan: Continue Cefazolin 2 g every 8 hours Continue Vancomycin 1g every 12 hours  Height: 5\' 7"  (170.2 cm) Weight: 132.5 kg (292 lb 1.8 oz) IBW/kg (Calculated) : 66.1  Temp (24hrs), Avg:98.8 F (37.1 C), Min:96.4 F (35.8 C), Max:100.2 F (37.9 C)  Recent Labs  Lab 07/18/21 1006 07/19/21 0210 07/20/21 0001 07/20/21 1107 07/21/21 0408 07/21/21 0823 07/21/21 1013 07/21/21 1107 07/21/21 1130 07/21/21 1225 07/21/21 1324  WBC 5.1 6.4 8.2  --  8.3  --   --   --   --   --  15.1*  CREATININE 1.82* 1.77* 1.54*   < > 1.60*   < > 1.60* 1.60* 1.60* 1.50* 1.73*   < > = values in this interval not displayed.    Estimated Creatinine Clearance: 70 mL/min (A) (by C-G formula based on SCr of 1.73 mg/dL (H)).    Allergies  Allergen Reactions   Aspirin Shortness Of Breath, Itching and Rash     Burning sensation (Patient reports he tolerates other NSAIDS)    Bee Venom Hives and Swelling   Lisinopril Cough   Shrimp (Diagnostic) Rash   Tomato Rash    Antimicrobials this admission: Cefazolin 10/17 >>  Vancomycin  10/17 >>  Rifampin 600 mg x 1 10/17 Fluconazole 400 mg x 1 10/17  Dose adjustments this admission: N/a  Microbiology results:  10/16 MRSA PCR: positive  Thank you for allowing pharmacy to participate in this patient's care.  Reatha Harps, PharmD PGY1 Pharmacy Resident 07/21/2021 4:56 PM Check [email or web address] for unit specific pharmacy number

## 2021-07-21 NOTE — TOC Progression Note (Signed)
Transition of Care [facility]) - Progression Note    Patient Details  Name: Jeffrey [name] MRN: [id number] Date of Birth: [birth year]  Transition of Care [facility]) CM/SW [place], [place] Phone Number: 07/21/2021, 5:30 PM  Clinical Narrative:    HF CSW spoke with [name] wife, [name] [phone] to check on her with her husband getting an LVAD implanted today. [name] reported to be doing okay and will return tomorrow and denied having any questions or concerns. CSW will continue to follow [name] and family throughout discharge.     Barriers to Discharge: Continued Medical Work up  Expected Discharge Plan and Services   In-house Referral: Clinical Social Work Discharge Planning Services: CM Consult   Living arrangements for the past 2 months: Single Family Home                                       Social Determinants of Health (SDOH) Interventions Depression Interventions/Treatment : Counseling  Readmission Risk Interventions No flowsheet data found.  Embry Huss, MSW, [place] Heart Failure Social Worker

## 2021-07-21 NOTE — Transfer of Care (Signed)
Immediate Anesthesia Transfer of Care Note  Patient: Jeffrey Malkin  Procedure(s) Performed: INSERTION OF IMPLANTABLE LEFT VENTRICULAR ASSIST DEVICE (Chest) TRANSESOPHAGEAL ECHOCARDIOGRAM (TEE) APPLICATION OF CELL SAVER  Patient Location: SICU  Anesthesia Type:General  Level of Consciousness: Patient remains intubated per anesthesia plan  Airway & Oxygen Therapy: Patient remains intubated per anesthesia plan and Patient placed on Ventilator (see vital sign flow sheet for setting)  Post-op Assessment: Report given to RN and Post -op Vital signs reviewed and stable  Post vital signs: Reviewed and stable  Last Vitals:  Vitals Value Taken Time  BP 83/59 07/21/21 1312  Temp    Pulse 75 07/21/21 1318  Resp 16 07/21/21 1318  SpO2 94 % 07/21/21 1318  Vitals shown include unvalidated device data.  Last Pain:  Vitals:   07/21/21 0000  TempSrc: Core  PainSc: Asleep      Patients Stated Pain Goal: 0 (70/26/37 8588)  Complications: No notable events documented.

## 2021-07-21 NOTE — Progress Notes (Signed)
CSW visited bedside post OR and wife had just left. RN stated she asked appropriate questions and plans to return in the morning. CSW will follow as needed. Raquel Sarna, [place], [place]

## 2021-07-21 NOTE — Anesthesia Procedure Notes (Signed)
Procedure Name: Intubation Date/Time: 07/21/2021 7:49 AM Performed by: Wilburn Cornelia, CRNA Pre-anesthesia Checklist: Patient identified, Emergency Drugs available, Suction available and Patient being monitored Patient Re-evaluated:Patient Re-evaluated prior to induction Oxygen Delivery Method: Circle System Utilized Preoxygenation: Pre-oxygenation with 100% oxygen Induction Type: IV induction Ventilation: Mask ventilation without difficulty Laryngoscope Size: Mac and 4 Grade View: Grade I Tube type: Oral Tube size: 8.0 mm Number of attempts: 1 Airway Equipment and Method: Stylet and Oral airway Placement Confirmation: ETT inserted through vocal cords under direct vision, positive ETCO2 and breath sounds checked- equal and bilateral Secured at: 23 cm Tube secured with: Tape Dental Injury: Teeth and Oropharynx as per pre-operative assessment  Comments: Placed by Lysle Dingwall Timcheck

## 2021-07-21 NOTE — Op Note (Signed)
CARDIOVASCULAR SURGERY OPERATIVE NOTE  Jeffrey [name] [id number] 07/21/2021   Surgeon:  Gaye Pollack, MD  First Assistant: Ellwood Handler, PA-C  Preoperative Diagnosis: Class IV heart failure with LVEF 20%   Postoperative Diagnosis:  Same   Procedure  Median Sternotomy Extracorporeal circulation 3.   Implantation of HeartMate 3 Left Ventricular Assist Device.   Anesthesia:  General Endotracheal   Clinical History/Surgical Indication:  This 45 year old gentleman has nonischemic cardiomyopathy with a left ventricular ejection fraction of less than 20%.  He had a recent admission in July for acute on chronic systolic congestive heart failure with cardiogenic shock probably related to atrial fibrillation with rapid ventricular response.  He required mechanical ventilation for acute respiratory failure and also developed acute renal failure with a rise in his creatinine to 4.65 from his normal of 1.5 probably related to shock and diuresis.  His creatinine improved to 1.56.  He has recovered and went to CIR and is now at home with outpatient physical therapy.  I think he is at the point where left ventricular assist device therapy is the only option for trying to improve his condition.  He is a higher risk patient due to morbid obesity, stage III chronic kidney disease with 1 remaining kidney, moderate RV dysfunction, some malnutrition and deconditioning due to his recent hospitalization, and diabetes.  I discussed LVAD therapy with him including alternatives, benefits, and risks and he seems to understand and is motivated to get better.    The operative procedure has been discussed with the patient and family including alternatives, benefits and risks; including but not limited to bleeding, blood transfusion, infection, stroke, myocardial infarction, pump failure or thrombus possibly requiring replacement, RV dysfunction requiring an RVAD or long term milrinone therapy, heart block requiring  a permanent pacemaker, organ dysfunction, and death.  The patient understands and agrees to proceed.      Preparation:  The patient was seen in the preoperative holding area and the correct patient, correct operation were confirmed with the patient after reviewing the medical record and catheterization. The consent was signed by me. Preoperative antibiotics were given. A pulmonary arterial line and radial arterial line were placed by the anesthesia team. The patient was taken back to the operating room and positioned supine on the operating room table. After being placed under general endotracheal anesthesia by the anesthesia team a foley catheter was placed. The neck, chest, abdomen, and both legs were prepped with betadine soap and solution and draped in the usual sterile manner. A surgical time-out was taken and the correct patient and operative procedure were confirmed with the nursing and anesthesia staff.   Pre-bypass TEE:   Complete TEE assessment was performed by Laurie Panda. This showed severe LV systolic dysfunction with a markedly dilated LV, trivial MR, no AI, trivial TR, RV systolic function mildly decreased, no PFO or ASD, negative bubble study.  Median sternotomy:    A median sternotomy was performed. The pericardium was opened in the midline. Right ventricular function appeared mildly depressed. The ascending aorta was of small size and had no palpable plaque. There were no contraindications to aortic cannulation or cross-clamping. The pericardium was opened along the left diaphragm out to the apex. Then a 4 cm transverse incision was made to the left of the umbilicus and continued down to the rectus fascia using electrocautery. A skin punch was used to create the drive line exit site about 3 cm below the left costal margin in the anterior axillary line.  The straight tunneling device was passed in a subcutaneous plane from the transverse abdominal incision to the pericardium  The other  tunneling device was passed from the abdominal incision the the skin exit site.  Cardiopulmonary Bypass:   The patient was fully systemically heparinized and the ACT was maintained > 400 sec. The proximal aortic arch was cannulated with a 85 F aortic cannula for arterial inflow. Venous cannulation was performed via the right atrial appendage using a two-staged venous cannula. Carbon dioxide was insufflated into the pericardium at 6L/min throughout the procedure to minimize intracardiac air. The systemic temperature was allowed to drift downward slightly during the procedure and the patient was actively rewarmed.   Insertion of apical outflow cannula:  The patient was placed on cardiopulmonary bypass. Laparotomy pads were placed behind the heart to aid exposure of the apex. A site was chosen on the anterior wall lateral to the LAD and superior to the true apex. A stab incision was made and a foley catheter placed through the coring knife and into the left ventricle. The balloon was inflated and with gentle traction on the catheter the coring knife was carefully advanced toward the mitral valve to create the ventriculotomy. The balloon was deflated and removed. A sump was placed into the LV and the ventricular core excised and sent to pathology. Trabeculations that might cause obstruction were excised. Then a series of 2-0 Ethibond horizontal mattress sutures were placed around the ventriculotomy. This took 13 sutures. The sutures were placed through the apical cuff. The sutures were tied and the apical cuff appeared to be well-sealed to the apex. Coseal was applied to seal needle holes. Then volume was left in the heart and the lungs inflated. The HeartMate 3 was brought onto the operative field. The inflow cannula was inserted into the apical cuff and the apical cuff lock engaged.  The drive line was then tunneled using the previously placed tunneling devices. The black line on the graft was oriented along  the margin of the pericardium to prevent twisting of the graft. The bend relief was fully engaged and tested with an axial pull. The pump was turned into the appropriate position. The heart and pump were returned to the pericardium and appeared to sit nicely.   Aortic outflow graft anastomosis:  The outflow graft was distended and positioned along the inferior margin of the heart and around the right atrium. The outflow graft was cut to the appropriate length. The ascending aorta was partially occluded with a Lemole clamp. A slightly diagonall midline aortotomy was created and the ends enlarged with a 4.5 mm punch. The outflow graft was anastomosed to the aorta using 4-0 prolene pledgetted sutures at the toe and heal that were run down each side. Coseal was used to seal the needle holes in the graft. The Lemole clamp was removed and the graft clamped with a peripheral Debakey clamp and deaired using a 21 gauge needle. The aortic anastomosis was hemostatic.   Weaning from bypass:  The patient was started on Milrinone 0.25 mcg/kg/min, epinephrine 3 mcg/kg/min, levophed at 45mcg/kg/min and nitric oxide at 30 ppm. The HeartMate 3 pump was started at 3000 rpm. TEE confirmed that the intracardiac air was removed.  Cardiopulmonary bypass flow was decreased to half flow and the clamp removed from the outflow graft. The speed was gradually increased to 4000 rpm. Flow was decreased to 1/4 and the speed gradually increased to 4800 rpm. Cardiopulmonary bypass was discontinued and the speed increased to 5400  rpm.  Pulmonary artery pressures were normal with a CVP of 12. CO was 6 L/min. CPB time was 102 minutes. TEE showed excellent apical cannula position. There was trivial MR, trivial TR. RV function appeared unchanged.  Completion:  Two temporary epicardial pacing wires were placed on the right ventricle. Heparin was fully reversed with protamine and the aortic and venous cannulas removed. Hemostasis was achieved.  Mediastinal drainage tubes and a left pleural chest tube was placed.  The sternum was closed with double #6 stainless steel wires. The fascia was closed with continuous # 1 vicryl suture. The subcutaneous tissue was closed with 2-0 vicryl continuous suture. The skin was closed with 3-0 vicryl subcuticular suture. The abdominal incision was closed with continuous 2-0 vicryl subcutaneous suture and 3-0 vicryl subcuticular skin suture. The drive line exit site was re-approximated  3-0 nylon skin sutures. The two drive line fasteners were applied. All sponge, needle, and instrument counts were reported correct at the end of the case. Dry sterile dressings were placed over the incisions and around the chest tubes which were connected to pleurevac suction. The patient was then transported to the surgical intensive care unit in stable condition.

## 2021-07-21 NOTE — Interval H&P Note (Signed)
History and Physical Interval Note:  07/21/2021 7:02 AM  Jeffrey [name]  has presented today for surgery, with the diagnosis of ICM.  The various methods of treatment have been discussed with the patient and family. After consideration of risks, benefits and other options for treatment, the patient has consented to  Procedure(s): INSERTION OF IMPLANTABLE LEFT VENTRICULAR ASSIST DEVICE (N/A) TRANSESOPHAGEAL ECHOCARDIOGRAM (TEE) (N/A) as a surgical intervention.  The patient's history has been reviewed, patient examined, no change in status, stable for surgery.  I have reviewed the patient's chart and labs.  Questions were answered to the patient's satisfaction.     Gaye Pollack

## 2021-07-21 NOTE — Anesthesia Procedure Notes (Signed)
Central Venous Catheter Insertion Performed by: Oleta Mouse, MD, anesthesiologist Start/End10/17/2022 8:01 AM, 07/21/2021 8:14 AM Patient location: OR. Preanesthetic checklist: patient identified, IV checked, site marked, risks and benefits discussed, surgical consent, monitors and equipment checked, pre-op evaluation, timeout performed and anesthesia consent Position: supine Hand hygiene performed  and maximum sterile barriers used  Catheter size: 9 Fr Total catheter length 10. MAC introducer Procedure performed using ultrasound guided technique. Ultrasound Notes:anatomy identified, needle tip was noted to be adjacent to the nerve/plexus identified, no ultrasound evidence of intravascular and/or intraneural injection and image(s) printed for medical record Attempts: 1 Following insertion, line sutured, dressing applied and Biopatch. Post procedure assessment: blood return through all ports, free fluid flow and no air  Patient tolerated the procedure well with no immediate complications.

## 2021-07-21 NOTE — Anesthesia Procedure Notes (Signed)
Central Venous Catheter Insertion Performed by: Oleta Mouse, MD, anesthesiologist Start/End10/17/2022 8:01 AM, 07/21/2021 8:14 AM Patient location: OR. Preanesthetic checklist: patient identified, IV checked, site marked, risks and benefits discussed, surgical consent, monitors and equipment checked, pre-op evaluation, timeout performed and anesthesia consent Hand hygiene performed  and maximum sterile barriers used  PA cath was placed.Swan type:thermodilution Procedure performed without using ultrasound guided technique. Attempts: 1 Patient tolerated the procedure well with no immediate complications.

## 2021-07-21 NOTE — Anesthesia Procedure Notes (Signed)
Arterial Line Insertion Start/End10/17/2022 7:15 AM, 07/21/2021 7:21 AM Performed by: Oleta Mouse, MD, anesthesiologist  Patient location: Pre-op. Preanesthetic checklist: patient identified, IV checked, site marked, risks and benefits discussed, surgical consent, monitors and equipment checked, pre-op evaluation, timeout performed and anesthesia consent Lidocaine 1% used for infiltration Left, radial was placed Catheter size: 20 G Hand hygiene performed  and maximum sterile barriers used   Attempts: 1 Procedure performed without using ultrasound guided technique. Following insertion, dressing applied and Biopatch. Post procedure assessment: normal and unchanged  Post procedure complications: second provider assisted. Patient tolerated the procedure well with no immediate complications.

## 2021-07-21 NOTE — Brief Op Note (Signed)
07/18/2021 - 07/21/2021  12:54 PM  PATIENT:  Jeffrey [name]  45 y.o. male  PRE-OPERATIVE DIAGNOSIS:  ICM  POST-OPERATIVE DIAGNOSIS:  ICM  PROCEDURE:  Procedure(s):  INSERTION OF IMPLANTABLE LEFT VENTRICULAR ASSIST DEVICE (N/A) TRANSESOPHAGEAL ECHOCARDIOGRAM (TEE) (N/A) APPLICATION OF CELL SAVER  SURGEON:  Surgeon(s) and Role:    * Bartle, Fernande Boyden, MD - Primary  PHYSICIAN ASSISTANT: Ellwood Handler PA-C  ASSISTANTS: Ara Kussmaul RNFA    ANESTHESIA:   general  EBL:  817 mL   BLOOD ADMINISTERED:   CC CELLSAVER and 3 FFP  DRAINS:  Mediastinal Chest Drains, JP drain drive line pocket    LOCAL MEDICATIONS USED:  NONE  SPECIMEN:  Source of Specimen:  LV Core  DISPOSITION OF SPECIMEN:  PATHOLOGY  COUNTS:  YES  TOURNIQUET:  * No tourniquets in log *  DICTATION: .Dragon Dictation  PLAN OF CARE: Admit to inpatient   PATIENT DISPOSITION:  ICU - intubated and hemodynamically stable.   Delay start of Pharmacological VTE agent (>24hrs) due to surgical blood loss or risk of bleeding: yes

## 2021-07-21 NOTE — Progress Notes (Signed)
[place] for heparin Indication: atrial fibrillation  Allergies  Allergen Reactions   Aspirin Shortness Of Breath, Itching and Rash     Burning sensation (Patient reports he tolerates other NSAIDS)    Bee Venom Hives and Swelling   Lisinopril Cough   Shrimp (Diagnostic) Rash   Tomato Rash    Patient Measurements: Height: 5\' 7"  (170.2 cm) Weight: 125.9 kg (277 lb 9 oz) IBW/kg (Calculated) : 66.1 Heparin Dosing Weight: 97kg  Vital Signs: Temp: 98.4 F (36.9 C) (10/17 0500) Temp Source: Core (10/17 0000) BP: 100/74 (10/17 0500) Pulse Rate: 77 (10/17 0500)  Labs: Recent Labs    07/18/21 1006 07/19/21 0210 07/19/21 1001 07/20/21 0001 07/20/21 0500 07/20/21 1107 07/20/21 1451 07/20/21 1539 07/20/21 2045 07/21/21 0408  HGB 12.0* 12.5*  --  13.7  --   --   --  15.6  --  13.6  HCT 38.1* 38.9*  --  42.7  --   --   --  46.0  --  42.8  PLT 321 300  --  340  --   --   --   --   --  327  APTT 32  --   --   --   --   --   --   --   --   --   LABPROT  --   --   --   --  14.9  --   --   --   --   --   INR  --   --   --   --  1.2  --   --   --   --   --   HEPARINUNFRC  --  0.20*   < >  --  0.73*  --  0.37  --   --  0.64  CREATININE 1.82* 1.77*  --  1.54*  --  1.53*  --   --  1.68* 1.60*   < > = values in this interval not displayed.     Estimated Creatinine Clearance: 73.4 mL/min (A) (by C-G formula based on SCr of 1.6 mg/dL (H)).   Medical History: Past Medical History:  Diagnosis Date   Anxiety    Aspirin allergy    Childhood asthma    Chronic systolic CHF (congestive heart failure) ([place])    a. EF 20-25% in 2012. b. EF 45-50% in 10/2011 with nonischemic nuc - presumed NICM. c. 12/2014 Echo: Sev depressed LV fxn, sev dil LV, mild LVH, mild MR, sev dil LA, mildly reduced RV fxn.   CKD (chronic kidney disease) stage 2, GFR 60-89 ml/min    H/O vasectomy 12/2019   High cholesterol    Hypertension    Morbid obesity ([place])     Nephrolithiasis    OSA on CPAP    Paroxysmal atrial fibrillation ([place])    Presumed NICM    a. 04/2014 Myoview: EF 26%, glob HK, sev glob HK, ? prior infarct;  b. Never cathed 2/2 CKD.   Renal cell carcinoma ([place])    a. s/p Rt robotic assisted partial converted to radical nephrectomy on 01/2013.   Troponin level elevated    a. 04/2014, 12/2014: felt due to CHF.   Type II diabetes mellitus (HCC)    Ventricular tachycardia    a. appropriate ICD therapy 12/2017   Assessment: 45 year old male with end stage heart failure, admitted post RHC with low cardiac output. Patient scheduled for LVAD 10/17. Patient with history  of afib on apixaban prior to admission, will bridge with IV heparin until surgery. CBC appears stable with no bleeding reported.   Heparin level at goal (0.64) on gtt at 1950 units/hr. No issues with line or bleeding reported per RN. Will follow up plans for anticoagulation after LVAD placement.  Goal of Therapy:  Heparin level 0.3-0.7 units/ml Monitor platelets by anticoagulation protocol: Yes   Plan:  Heparin held for LVAD placement, f/u resuming after stable post-op Daily heparin level and CBC  Thank you for allowing pharmacy to participate in this patient's care.  Reatha Harps, PharmD PGY1 Pharmacy Resident 07/21/2021 5:47 AM Check [email or web address] for unit specific pharmacy number

## 2021-07-21 NOTE — Progress Notes (Addendum)
Advanced Heart Failure VAD Team Note  PCP-Cardiologist: Glori Bickers, MD   Subjective:    Just returned from OR, s/p HM3 LVAD implant.    Intubated, sedated.   On Epi 3, NE 2, Milrinone 0.25, NO2 0.3, Amio gtt at 30/hr    Swan #s  CVP 17 PAP 46/26 (34) CI 2.95 CO 7.00  PAPi 1.2    MAPs 80s   LVAD INTERROGATION:  HeartMate III LVAD:   Flow 4.5 liters/min, speed 5600, power 4.1, PI 3.2.    Objective:    Vital Signs:   Temp:  [97 F (36.1 C)-100.2 F (37.9 C)] 97 F (36.1 C) (10/17 1405) Pulse Rate:  [72-159] 159 (10/17 1405) Resp:  [16-41] 31 (10/17 1405) BP: (83-119)/(57-90) 100/74 (10/17 0500) SpO2:  [90 %-97 %] 96 % (10/17 1405) FiO2 (%):  [80 %-100 %] 80 % (10/17 1405) Weight:  [132.5 kg] 132.5 kg (10/17 0500) Last BM Date: 07/18/21 Mean arterial Pressure 80   Intake/Output:   Intake/Output Summary (Last 24 hours) at 07/21/2021 1407 Last data filed at 07/21/2021 1318 Gross per 24 hour  Intake 4029.85 ml  Output 3712 ml  Net 317.85 ml     Physical Exam    General:  intubated and seated  HEENT: normal + ETT Neck: supple. JVP elevated to jaw. + Lt IJ Swan. Carotids 2+ bilat; no bruits. No lymphadenopathy or thyromegaly appreciated. Cor: Mechanical heart sounds with LVAD hum present. + sternal dressing + CT Lungs: intubated and clear  Abdomen: soft, nontender, nondistended. No hepatosplenomegaly. No bruits or masses. Good bowel sounds. Driveline: C/D/I; securement device intact and driveline incorporated Extremities: no cyanosis, clubbing, rash, 1+ bilateral LEE edema Neuro: intubated and sedated    Telemetry   NSR 91 bpm   EKG    Poor quality tracing (LVAD), appears NSR 79 bpm   Labs   Basic Metabolic Panel: Recent Labs  Lab 07/19/21 0210 07/20/21 0000 07/20/21 0001 07/20/21 1107 07/20/21 1539 07/20/21 2045 07/21/21 0408 07/21/21 0823 07/21/21 0927 07/21/21 0937 07/21/21 1013 07/21/21 1039 07/21/21 1107 07/21/21 1110  07/21/21 1130 07/21/21 1133 07/21/21 1138 07/21/21 1225  NA 137  --  137 136   < > 132* 132*   < > 138   < > 139   < > 139 139 140 140 142 141  K 3.7  --  3.5 4.2   < > 4.3 4.3   < > 3.7   < > 3.5   < > 4.2 4.1 3.7 3.8 3.5 3.6  CL 105  --  102 102  --  101 100   < > 104  --  104  --  103  --  103  --   --  104  CO2 24  --  24 27  --  24 24  --   --   --   --   --   --   --   --   --   --   --   GLUCOSE 140*  --  133* 116*  --  183* 209*   < > 148*  --  163*  --  178*  --  181*  --   --  194*  BUN 25*  --  23* 21*  --  27* 26*   < > 25*  --  25*  --  25*  --  26*  --   --  25*  CREATININE 1.77*  --  1.54* 1.53*  --  1.68* 1.60*   < > 1.50*  --  1.60*  --  1.60*  --  1.60*  --   --  1.50*  CALCIUM 9.2  --  9.4 9.9  --  9.8 9.8  --   --   --   --   --   --   --   --   --   --   --   MG 1.6* 1.8  --   --   --   --   --   --   --   --   --   --   --   --   --   --   --   --    < > = values in this interval not displayed.    Liver Function Tests: Recent Labs  Lab 07/20/21 1107  AST 20  ALT 42  ALKPHOS 69  BILITOT 0.7  PROT 7.4  ALBUMIN 3.4*   No results for input(s): LIPASE, AMYLASE in the last 168 hours. No results for input(s): AMMONIA in the last 168 hours.  CBC: Recent Labs  Lab 07/18/21 1006 07/19/21 0210 07/20/21 0001 07/20/21 1539 07/21/21 0408 07/21/21 0823 07/21/21 1055 07/21/21 1107 07/21/21 1110 07/21/21 1130 07/21/21 1133 07/21/21 1138 07/21/21 1225  WBC 5.1 6.4 8.2  --  8.3  --   --   --   --   --   --   --   --   HGB 12.0* 12.5* 13.7   < > 13.6   < > 10.3*   < > 11.2* 11.6* 11.6* 10.9* 11.6*  HCT 38.1* 38.9* 42.7   < > 42.8   < > 31.4*   < > 33.0* 34.0* 34.0* 32.0* 34.0*  MCV 80.4 77.6* 77.5*  --  77.8*  --   --   --   --   --   --   --   --   PLT 321 300 340  --  327  --  248  --   --   --   --   --   --    < > = values in this interval not displayed.    INR: Recent Labs  Lab 07/20/21 0500  INR 1.2    Other results: EKG:    Imaging   DG  Chest Port 1 View  Result Date: 07/21/2021 CLINICAL DATA:  LVAD EXAM: PORTABLE CHEST 1 VIEW COMPARISON:  Radiograph 07/20/2021 FINDINGS: Postsurgical changes of median sternotomy and LVAD. There is a pulmonary artery catheter overlying the right pulmonary artery. There is a left sided chest tube in place. Endotracheal tube tip overlies the midthoracic trachea. There is a nasogastric tube with side port overlying the stomach. There is a mediastinal drain overlying the lower chest. There is a subcutaneous AICD lead unchanged in position. Diffuse interstitial and confluent opacities. No large pleural effusion or visible pneumothorax. IMPRESSION: Postoperative chest with new LVAD and support lines and tubes as described above. Mild-to-moderate pulmonary edema. Electronically Signed   By: Maurine Simmering M.D.   On: 07/21/2021 14:00   DG CHEST PORT 1 VIEW  Result Date: 07/20/2021 CLINICAL DATA:  Check central line placement EXAM: PORTABLE CHEST 1 VIEW COMPARISON:  Film from earlier in the same day. FINDINGS: Gordy Councilman catheter has been withdrawn and now lies in the main right pulmonary artery. Defibrillator is again seen. Cardiac shadow remains enlarged. The lungs are clear. IMPRESSION: Slight withdrawal of Swan-Ganz catheter as described. Electronically  Signed   By: [name] Catalina M.D.   On: 07/20/2021 22:27   DG CHEST PORT 1 VIEW  Result Date: 07/20/2021 CLINICAL DATA:  Check central line placement EXAM: PORTABLE CHEST 1 VIEW COMPARISON:  07/18/2021 FINDINGS: Cardiac shadow is enlarged but stable. Swan-Ganz catheter is again noted in the right pulmonary artery although somewhat deeper in the lower lobe branches when compared with the prior study. No pneumothorax is noted. No focal infiltrate is seen. Defibrillator is noted anteriorly. No bony abnormality is noted. IMPRESSION: Swan-Ganz catheter is been advanced somewhat deeper into the right pulmonary artery. No other focal abnormality is noted. Electronically  Signed   By: [name] Catalina M.D.   On: 07/20/2021 21:03     Medications:     Scheduled Medications:  [START ON 07/22/2021] acetaminophen  1,000 mg Oral Q6H   Or   [START ON 07/22/2021] acetaminophen (TYLENOL) oral liquid 160 mg/5 mL  1,000 mg Per Tube Q6H   acetaminophen (TYLENOL) oral liquid 160 mg/5 mL  650 mg Per Tube Once   Or   acetaminophen  650 mg Rectal Once   [START ON 07/22/2021] bisacodyl  10 mg Oral Daily   Or   [START ON 07/22/2021] bisacodyl  10 mg Rectal Daily   Chlorhexidine Gluconate Cloth  6 each Topical Daily   [START ON 07/22/2021] docusate sodium  200 mg Oral Daily   mupirocin ointment  1 application Nasal BID   [START ON 07/23/2021] pantoprazole  40 mg Oral Daily   sertraline  25 mg Oral Daily   [START ON 07/22/2021] sodium chloride flush  3 mL Intravenous Q12H   traZODone  100 mg Oral QHS    Infusions:  sodium chloride     [START ON 07/22/2021] sodium chloride     sodium chloride     albumin human     And   sodium chloride      ceFAZolin (ANCEF) IV     dexmedetomidine (PRECEDEX) IV infusion 1 mcg/kg/hr (07/21/21 1310)   epinephrine 3 mcg/min (07/21/21 1401)   famotidine (PEPCID) IV 20 mg (07/21/21 1354)   [START ON 07/22/2021] fluconazole (DIFLUCAN) IV     insulin 10.5 Units/hr (07/21/21 1310)   lactated ringers     lactated ringers     lactated ringers     magnesium sulfate 4 g (07/21/21 1403)   milrinone 0.25 mcg/kg/min (07/21/21 1405)   norepinephrine (LEVOPHED) Adult infusion 2 mcg/min (07/21/21 1310)   potassium chloride     [START ON 07/22/2021] rifampin (RIFADIN) IVPB     vancomycin      PRN Medications: sodium chloride, albumin human **AND** sodium chloride, dextrose, lactated ringers, midazolam, morphine injection, ondansetron (ZOFRAN) IV, oxyCODONE, [START ON 07/22/2021] sodium chloride flush, traMADol   Assessment/Plan:    1. Acute on Chronic systolic CHF - Nonischemic CM, end-stage - Echo (9/21): EF 20-25%.   - Echo  8/22: EF  < 20%, severe LV dilation, moderate RV dysfunction.   - RHC (8/22): well compensated hemodynamics. - s/p S-ICD - NYHA III-IIIB  - Body mass index is 45.42 kg/m. Not transplant candidate - RHC 10/14 with elevated filling pressures and low CO. Started milrinone 0.25 for optimization prior to VAD - S/p HM3 LVAD 10/17 (DT) - On milrinone 0.25, + Epi 3, NE 2, NO2 0.3. Hemodynamics good on swan, CI 2.92 - Continue current gtts, wean as tolerated per CT surgery    2 Atrial fibrillation: Paroxysmal - Emergent TEE-guided DCCV 7/27 due to inability to control HR. -  Recurrent AF on 8/5. s/p DC-CV on 05/10/21.  - Seen by EP. Not a candidate for ablation due to body habitus  (could consider AVN ablation and CRT, if needed)  - In NSR today - Continue IV amio to prevent recurrent VT while on inotropes  - off a/c for now post-op, resume once cleared by CT surgery    3. CKD stage 3B  - Has solitary kidney.   - Baseline creatinine is 1.6-2.9.  - Avoid hypotension. - Improving with support => 1.5 today.    4. OSA - Continue CPAP after extubation.    5. DM2. Poorly controlled - On Jardiance. - Hgb A1c 9.4. - Cover with SSI   6. Hematuria - MRI and cystoscopy ok     I reviewed the LVAD parameters from today, and compared the results to the patient's prior recorded data.  No programming changes were made.  The LVAD is functioning within specified parameters.  The patient performs LVAD self-test daily.  LVAD interrogation was negative for any significant power changes, alarms or PI events/speed drops.  LVAD equipment check completed and is in good working order.  Back-up equipment present.   LVAD education done on emergency procedures and precautions and reviewed exit site care.  Length of Stay: [address] Ladoris Gene 07/21/2021, 2:07 PM  VAD Team --- VAD ISSUES ONLY--- Pager [phone] (7am - 7am)  Advanced Heart Failure Team  Pager [phone] (M-F; 7a - 5p)  Please contact [facility]  for night-coverage after hours (5p -7a ) and weekends on [email or web address]   Agree with above.   He is back from HM-3 VAD implant. Remains intubated. On NO, epi, NE and milrinone. Hemodynamics look good. Vad parameters stable. Mildly acidotic on ABG.   General:  Intubated. Sedated HEENT: normal + ETT Neck: supple. RIJ swan . Carotids 2+ bilat; no bruits. No lymphadenopathy or thryomegaly appreciated. Cor: Sternal  dressing ok + CTs VAD hum  Lungs: clear Abdomen: obese nT/ND hypoactive BS Driveline site bandaged Extremities: no cyanosis, clubbing, rash, tr edema Neuro: intubated sedated  Patient seen at bedside with Dr. Cyndia Bent. Tenuous but stable immediately post-op. CTs ok. Continue current inotropes for now. Wean slowly as tolerated. VAD parameters look good. Continue current speed. Work toward extubation.   CRITICAL CARE Performed by: Glori Bickers  Total critical care time: 45 minutes  Critical care time was exclusive of separately billable procedures and treating other patients.  Critical care was necessary to treat or prevent imminent or life-threatening deterioration.  Critical care was time spent personally by me (independent of midlevel providers or residents) on the following activities: development of treatment plan with patient and/or surrogate as well as nursing, discussions with consultants, evaluation of patient's response to treatment, examination of patient, obtaining history from patient or surrogate, ordering and performing treatments and interventions, ordering and review of laboratory studies, ordering and review of radiographic studies, pulse oximetry and re-evaluation of patient's condition.  Glori Bickers, MD  2:35 PM

## 2021-07-22 ENCOUNTER — Inpatient Hospital Stay (HOSPITAL_COMMUNITY): Payer: Medicaid Other

## 2021-07-22 DIAGNOSIS — E119 Type 2 diabetes mellitus without complications: Secondary | ICD-10-CM | POA: Diagnosis not present

## 2021-07-22 DIAGNOSIS — E1122 Type 2 diabetes mellitus with diabetic chronic kidney disease: Secondary | ICD-10-CM | POA: Diagnosis not present

## 2021-07-22 DIAGNOSIS — I5023 Acute on chronic systolic (congestive) heart failure: Secondary | ICD-10-CM | POA: Diagnosis not present

## 2021-07-22 DIAGNOSIS — Z4682 Encounter for fitting and adjustment of non-vascular catheter: Secondary | ICD-10-CM | POA: Diagnosis not present

## 2021-07-22 DIAGNOSIS — I428 Other cardiomyopathies: Secondary | ICD-10-CM | POA: Diagnosis not present

## 2021-07-22 DIAGNOSIS — I5022 Chronic systolic (congestive) heart failure: Secondary | ICD-10-CM | POA: Diagnosis not present

## 2021-07-22 DIAGNOSIS — D72829 Elevated white blood cell count, unspecified: Secondary | ICD-10-CM | POA: Diagnosis not present

## 2021-07-22 DIAGNOSIS — I130 Hypertensive heart and chronic kidney disease with heart failure and stage 1 through stage 4 chronic kidney disease, or unspecified chronic kidney disease: Secondary | ICD-10-CM | POA: Diagnosis not present

## 2021-07-22 DIAGNOSIS — E872 Acidosis, unspecified: Secondary | ICD-10-CM | POA: Diagnosis not present

## 2021-07-22 DIAGNOSIS — I272 Pulmonary hypertension, unspecified: Secondary | ICD-10-CM | POA: Diagnosis not present

## 2021-07-22 DIAGNOSIS — Z20822 Contact with and (suspected) exposure to covid-19: Secondary | ICD-10-CM | POA: Diagnosis not present

## 2021-07-22 DIAGNOSIS — N183 Chronic kidney disease, stage 3 unspecified: Secondary | ICD-10-CM | POA: Diagnosis not present

## 2021-07-22 DIAGNOSIS — I517 Cardiomegaly: Secondary | ICD-10-CM | POA: Diagnosis not present

## 2021-07-22 DIAGNOSIS — Z452 Encounter for adjustment and management of vascular access device: Secondary | ICD-10-CM | POA: Diagnosis not present

## 2021-07-22 DIAGNOSIS — N1832 Chronic kidney disease, stage 3b: Secondary | ICD-10-CM | POA: Diagnosis not present

## 2021-07-22 DIAGNOSIS — Z6841 Body Mass Index (BMI) 40.0 and over, adult: Secondary | ICD-10-CM | POA: Diagnosis not present

## 2021-07-22 DIAGNOSIS — I480 Paroxysmal atrial fibrillation: Secondary | ICD-10-CM | POA: Diagnosis not present

## 2021-07-22 DIAGNOSIS — E1165 Type 2 diabetes mellitus with hyperglycemia: Secondary | ICD-10-CM | POA: Diagnosis not present

## 2021-07-22 DIAGNOSIS — Z95811 Presence of heart assist device: Secondary | ICD-10-CM | POA: Diagnosis not present

## 2021-07-22 LAB — COMPREHENSIVE METABOLIC PANEL
ALT: 29 U/L (ref 0–44)
AST: 60 U/L — ABNORMAL HIGH (ref 15–41)
Albumin: 2.9 g/dL — ABNORMAL LOW (ref 3.5–5.0)
Alkaline Phosphatase: 56 U/L (ref 38–126)
Anion gap: 9 (ref 5–15)
BUN: 20 mg/dL (ref 6–20)
CO2: 21 mmol/L — ABNORMAL LOW (ref 22–32)
Calcium: 9.8 mg/dL (ref 8.9–10.3)
Chloride: 106 mmol/L (ref 98–111)
Creatinine, Ser: 1.42 mg/dL — ABNORMAL HIGH (ref 0.61–1.24)
GFR, Estimated: >60 mL/min (ref >60)
Glucose, Bld: 119 mg/dL — ABNORMAL HIGH (ref 70–99)
Potassium: 4.7 mmol/L (ref 3.5–5.1)
Sodium: 136 mmol/L (ref 135–145)
Total Bilirubin: 0.8 mg/dL (ref 0.3–1.2)
Total Protein: 6.4 g/dL — ABNORMAL LOW (ref 6.5–8.1)

## 2021-07-22 LAB — GLUCOSE, CAPILLARY
Glucose-Capillary: 102 mg/dL — ABNORMAL HIGH (ref 70–99)
Glucose-Capillary: 107 mg/dL — ABNORMAL HIGH (ref 70–99)
Glucose-Capillary: 107 mg/dL — ABNORMAL HIGH (ref 70–99)
Glucose-Capillary: 110 mg/dL — ABNORMAL HIGH (ref 70–99)
Glucose-Capillary: 113 mg/dL — ABNORMAL HIGH (ref 70–99)
Glucose-Capillary: 114 mg/dL — ABNORMAL HIGH (ref 70–99)
Glucose-Capillary: 115 mg/dL — ABNORMAL HIGH (ref 70–99)
Glucose-Capillary: 117 mg/dL — ABNORMAL HIGH (ref 70–99)
Glucose-Capillary: 122 mg/dL — ABNORMAL HIGH (ref 70–99)
Glucose-Capillary: 122 mg/dL — ABNORMAL HIGH (ref 70–99)
Glucose-Capillary: 128 mg/dL — ABNORMAL HIGH (ref 70–99)
Glucose-Capillary: 129 mg/dL — ABNORMAL HIGH (ref 70–99)
Glucose-Capillary: 139 mg/dL — ABNORMAL HIGH (ref 70–99)
Glucose-Capillary: 144 mg/dL — ABNORMAL HIGH (ref 70–99)
Glucose-Capillary: 155 mg/dL — ABNORMAL HIGH (ref 70–99)
Glucose-Capillary: 155 mg/dL — ABNORMAL HIGH (ref 70–99)
Glucose-Capillary: 165 mg/dL — ABNORMAL HIGH (ref 70–99)
Glucose-Capillary: 176 mg/dL — ABNORMAL HIGH (ref 70–99)
Glucose-Capillary: 92 mg/dL (ref 70–99)

## 2021-07-22 LAB — CBC WITH DIFFERENTIAL/PLATELET
Abs Immature Granulocytes: 0.04 10*3/uL (ref 0.00–0.07)
Basophils Absolute: 0 10*3/uL (ref 0.0–0.1)
Basophils Relative: 0 %
Eosinophils Absolute: 0 10*3/uL (ref 0.0–0.5)
Eosinophils Relative: 0 %
HCT: 35.8 % — ABNORMAL LOW (ref 39.0–52.0)
Hemoglobin: 11.2 g/dL — ABNORMAL LOW (ref 13.0–17.0)
Immature Granulocytes: 0 %
Lymphocytes Relative: 8 %
Lymphs Abs: 0.8 10*3/uL (ref 0.7–4.0)
MCH: 24.6 pg — ABNORMAL LOW (ref 26.0–34.0)
MCHC: 31.3 g/dL (ref 30.0–36.0)
MCV: 78.5 fL — ABNORMAL LOW (ref 80.0–100.0)
Monocytes Absolute: 1 10*3/uL (ref 0.1–1.0)
Monocytes Relative: 10 %
Neutro Abs: 8.5 10*3/uL — ABNORMAL HIGH (ref 1.7–7.7)
Neutrophils Relative %: 82 %
Platelets: 230 10*3/uL (ref 150–400)
RBC: 4.56 MIL/uL (ref 4.22–5.81)
RDW: 15.7 % — ABNORMAL HIGH (ref 11.5–15.5)
WBC: 10.4 10*3/uL (ref 4.0–10.5)
nRBC: 0 % (ref 0.0–0.2)

## 2021-07-22 LAB — POCT I-STAT 7, (LYTES, BLD GAS, ICA,H+H)
Acid-base deficit: 2 mmol/L (ref 0.0–2.0)
Acid-base deficit: 2 mmol/L (ref 0.0–2.0)
Acid-base deficit: 3 mmol/L — ABNORMAL HIGH (ref 0.0–2.0)
Acid-base deficit: 4 mmol/L — ABNORMAL HIGH (ref 0.0–2.0)
Bicarbonate: 20.3 mmol/L (ref 20.0–28.0)
Bicarbonate: 22.1 mmol/L (ref 20.0–28.0)
Bicarbonate: 22.1 mmol/L (ref 20.0–28.0)
Bicarbonate: 23.2 mmol/L (ref 20.0–28.0)
Calcium, Ion: 1.27 mmol/L (ref 1.15–1.40)
Calcium, Ion: 1.39 mmol/L (ref 1.15–1.40)
Calcium, Ion: 1.4 mmol/L (ref 1.15–1.40)
Calcium, Ion: 1.44 mmol/L — ABNORMAL HIGH (ref 1.15–1.40)
HCT: 33 % — ABNORMAL LOW (ref 39.0–52.0)
HCT: 36 % — ABNORMAL LOW (ref 39.0–52.0)
HCT: 36 % — ABNORMAL LOW (ref 39.0–52.0)
HCT: 39 % (ref 39.0–52.0)
Hemoglobin: 11.2 g/dL — ABNORMAL LOW (ref 13.0–17.0)
Hemoglobin: 12.2 g/dL — ABNORMAL LOW (ref 13.0–17.0)
Hemoglobin: 12.2 g/dL — ABNORMAL LOW (ref 13.0–17.0)
Hemoglobin: 13.3 g/dL (ref 13.0–17.0)
O2 Saturation: 50 %
O2 Saturation: 89 %
O2 Saturation: 94 %
O2 Saturation: 96 %
Patient temperature: 37.2
Patient temperature: 38
Patient temperature: 38
Patient temperature: 38.3
Potassium: 3.5 mmol/L (ref 3.5–5.1)
Potassium: 4.2 mmol/L (ref 3.5–5.1)
Potassium: 4.5 mmol/L (ref 3.5–5.1)
Potassium: 4.7 mmol/L (ref 3.5–5.1)
Sodium: 138 mmol/L (ref 135–145)
Sodium: 139 mmol/L (ref 135–145)
Sodium: 139 mmol/L (ref 135–145)
Sodium: 142 mmol/L (ref 135–145)
TCO2: 21 mmol/L — ABNORMAL LOW (ref 22–32)
TCO2: 23 mmol/L (ref 22–32)
TCO2: 23 mmol/L (ref 22–32)
TCO2: 24 mmol/L (ref 22–32)
pCO2 arterial: 36.7 mmHg (ref 32.0–48.0)
pCO2 arterial: 36.9 mmHg (ref 32.0–48.0)
pCO2 arterial: 40 mmHg (ref 32.0–48.0)
pCO2 arterial: 42.1 mmHg (ref 32.0–48.0)
pH, Arterial: 7.333 — ABNORMAL LOW (ref 7.350–7.450)
pH, Arterial: 7.356 (ref 7.350–7.450)
pH, Arterial: 7.372 (ref 7.350–7.450)
pH, Arterial: 7.391 (ref 7.350–7.450)
pO2, Arterial: 30 mmHg — CL (ref 83.0–108.0)
pO2, Arterial: 64 mmHg — ABNORMAL LOW (ref 83.0–108.0)
pO2, Arterial: 75 mmHg — ABNORMAL LOW (ref 83.0–108.0)
pO2, Arterial: 87 mmHg (ref 83.0–108.0)

## 2021-07-22 LAB — CBC
HCT: 35.6 % — ABNORMAL LOW (ref 39.0–52.0)
HCT: 37.5 % — ABNORMAL LOW (ref 39.0–52.0)
Hemoglobin: 11.3 g/dL — ABNORMAL LOW (ref 13.0–17.0)
Hemoglobin: 11.7 g/dL — ABNORMAL LOW (ref 13.0–17.0)
MCH: 24.4 pg — ABNORMAL LOW (ref 26.0–34.0)
MCH: 24.6 pg — ABNORMAL LOW (ref 26.0–34.0)
MCHC: 31.2 g/dL (ref 30.0–36.0)
MCHC: 31.7 g/dL (ref 30.0–36.0)
MCV: 77.6 fL — ABNORMAL LOW (ref 80.0–100.0)
MCV: 78.3 fL — ABNORMAL LOW (ref 80.0–100.0)
Platelets: 223 10*3/uL (ref 150–400)
Platelets: 240 10*3/uL (ref 150–400)
RBC: 4.59 MIL/uL (ref 4.22–5.81)
RBC: 4.79 MIL/uL (ref 4.22–5.81)
RDW: 15.6 % — ABNORMAL HIGH (ref 11.5–15.5)
RDW: 15.8 % — ABNORMAL HIGH (ref 11.5–15.5)
WBC: 10.1 10*3/uL (ref 4.0–10.5)
WBC: 14.4 10*3/uL — ABNORMAL HIGH (ref 4.0–10.5)
nRBC: 0 % (ref 0.0–0.2)
nRBC: 0 % (ref 0.0–0.2)

## 2021-07-22 LAB — PROTIME-INR
INR: 1.3 — ABNORMAL HIGH (ref 0.8–1.2)
Prothrombin Time: 16.1 seconds — ABNORMAL HIGH (ref 11.4–15.2)

## 2021-07-22 LAB — PREPARE FRESH FROZEN PLASMA
Unit division: 0
Unit division: 0
Unit division: 0
Unit division: 0

## 2021-07-22 LAB — COOXEMETRY PANEL
Carboxyhemoglobin: 1 % (ref 0.5–1.5)
Carboxyhemoglobin: 1.1 % (ref 0.5–1.5)
Methemoglobin: 0.9 % (ref 0.0–1.5)
Methemoglobin: 1.1 % (ref 0.0–1.5)
O2 Saturation: 59.5 %
O2 Saturation: 57.3 %
Total hemoglobin: 11.7 g/dL — ABNORMAL LOW (ref 12.0–16.0)
Total hemoglobin: 10.7 g/dL — ABNORMAL LOW (ref 12.0–16.0)

## 2021-07-22 LAB — BRAIN NATRIURETIC PEPTIDE: B Natriuretic Peptide: 544.3 pg/mL — ABNORMAL HIGH (ref 0.0–100.0)

## 2021-07-22 LAB — BPAM FFP
Blood Product Expiration Date: 202210182359
Blood Product Expiration Date: 202210182359
Blood Product Expiration Date: 202210182359
Blood Product Expiration Date: 202210222359
ISSUE DATE / TIME: 202210170932
ISSUE DATE / TIME: 202210170932
ISSUE DATE / TIME: 202210170932
ISSUE DATE / TIME: 202210170932
Unit Type and Rh: 600
Unit Type and Rh: 6200
Unit Type and Rh: 6200
Unit Type and Rh: 6200

## 2021-07-22 LAB — BASIC METABOLIC PANEL
Anion gap: 8 (ref 5–15)
BUN: 18 mg/dL (ref 6–20)
CO2: 22 mmol/L (ref 22–32)
Calcium: 10 mg/dL (ref 8.9–10.3)
Chloride: 105 mmol/L (ref 98–111)
Creatinine, Ser: 1.55 mg/dL — ABNORMAL HIGH (ref 0.61–1.24)
GFR, Estimated: 56 mL/min — ABNORMAL LOW (ref >60)
Glucose, Bld: 135 mg/dL — ABNORMAL HIGH (ref 70–99)
Potassium: 4.4 mmol/L (ref 3.5–5.1)
Sodium: 135 mmol/L (ref 135–145)

## 2021-07-22 LAB — PHOSPHORUS: Phosphorus: 3.5 mg/dL (ref 2.5–4.6)

## 2021-07-22 LAB — SURGICAL PATHOLOGY

## 2021-07-22 LAB — CALCIUM, IONIZED: Calcium, Ionized, Serum: 4.9 mg/dL (ref 4.5–5.6)

## 2021-07-22 LAB — LACTATE DEHYDROGENASE: LDH: 318 U/L — ABNORMAL HIGH (ref 98–192)

## 2021-07-22 LAB — MAGNESIUM
Magnesium: 2.4 mg/dL (ref 1.7–2.4)
Magnesium: 2.4 mg/dL (ref 1.7–2.4)
Magnesium: 1.9 mg/dL (ref 1.7–2.4)

## 2021-07-22 MED ORDER — MAGNESIUM SULFATE 2 GM/50ML IV SOLN
2.0000 g | Freq: Once | INTRAVENOUS | Status: AC
  Administered 2021-07-22: 2 g via INTRAVENOUS
  Filled 2021-07-22: qty 50

## 2021-07-22 MED ORDER — ORAL CARE MOUTH RINSE
15.0000 mL | Freq: Two times a day (BID) | OROMUCOSAL | Status: DC
  Administered 2021-07-22 – 2021-07-26 (×6): 15 mL via OROMUCOSAL

## 2021-07-22 MED ORDER — INSULIN ASPART 100 UNIT/ML IJ SOLN
0.0000 [IU] | INTRAMUSCULAR | Status: DC
  Administered 2021-07-22: 4 [IU] via SUBCUTANEOUS
  Administered 2021-07-22: 3 [IU] via SUBCUTANEOUS
  Administered 2021-07-23: 4 [IU] via SUBCUTANEOUS
  Administered 2021-07-23 (×2): 7 [IU] via SUBCUTANEOUS
  Administered 2021-07-23: 4 [IU] via SUBCUTANEOUS
  Administered 2021-07-23: 7 [IU] via SUBCUTANEOUS
  Administered 2021-07-24: 3 [IU] via SUBCUTANEOUS
  Administered 2021-07-24: 4 [IU] via SUBCUTANEOUS
  Administered 2021-07-24 – 2021-07-25 (×6): 3 [IU] via SUBCUTANEOUS
  Administered 2021-07-25: 7 [IU] via SUBCUTANEOUS
  Administered 2021-07-26: 3 [IU] via SUBCUTANEOUS
  Administered 2021-07-26 (×2): 4 [IU] via SUBCUTANEOUS
  Administered 2021-07-27 (×4): 3 [IU] via SUBCUTANEOUS
  Administered 2021-07-27 (×2): 4 [IU] via SUBCUTANEOUS
  Administered 2021-07-27: 3 [IU] via SUBCUTANEOUS
  Administered 2021-07-28: 7 [IU] via SUBCUTANEOUS
  Administered 2021-07-28: 4 [IU] via SUBCUTANEOUS
  Administered 2021-07-28: 7 [IU] via SUBCUTANEOUS
  Administered 2021-07-28: 3 [IU] via SUBCUTANEOUS
  Administered 2021-07-28: 4 [IU] via SUBCUTANEOUS
  Administered 2021-07-29: 3 [IU] via SUBCUTANEOUS
  Administered 2021-07-29 (×3): 4 [IU] via SUBCUTANEOUS
  Administered 2021-07-29: 3 [IU] via SUBCUTANEOUS
  Administered 2021-07-29 – 2021-07-30 (×3): 4 [IU] via SUBCUTANEOUS
  Administered 2021-07-30: 3 [IU] via SUBCUTANEOUS

## 2021-07-22 MED ORDER — WARFARIN - PHYSICIAN DOSING INPATIENT: Freq: Every day | Status: DC

## 2021-07-22 MED ORDER — INSULIN DETEMIR 100 UNIT/ML ~~LOC~~ SOLN
30.0000 [IU] | Freq: Two times a day (BID) | SUBCUTANEOUS | Status: DC
  Administered 2021-07-22: 30 [IU] via SUBCUTANEOUS
  Filled 2021-07-22 (×3): qty 0.3

## 2021-07-22 MED ORDER — WARFARIN SODIUM 5 MG PO TABS
5.0000 mg | ORAL_TABLET | Freq: Every day | ORAL | Status: DC
Start: 2021-07-22 — End: 2021-07-23
  Administered 2021-07-22: 5 mg via ORAL
  Filled 2021-07-22: qty 1

## 2021-07-22 MED ORDER — FUROSEMIDE 40 MG PO TABS: 40.0000 mg | ORAL_TABLET | Freq: Once | ORAL | Status: DC

## 2021-07-22 MED ORDER — FUROSEMIDE 10 MG/ML IJ SOLN
40.0000 mg | Freq: Once | INTRAMUSCULAR | Status: AC
  Administered 2021-07-22: 40 mg via INTRAVENOUS
  Filled 2021-07-22: qty 4

## 2021-07-22 MED FILL — Thrombin (Recombinant) For Soln 20000 Unit: CUTANEOUS | Qty: 1 | Status: AC

## 2021-07-22 NOTE — Progress Notes (Signed)
LVAD Coordinator Rounding Note:  Admitted 07/18/21 due to Dr. [name] for elective VAD placement.   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 Cyndia Bent under Destination Therapy criteria due to BMI and chronic kidney disease.   Pt lying in bed this am, remains vented. NO weaned off, planning to extubate later this am. Pt awake, follows commands, answers questions by nodding/shaking head; remains sleepy.   Vital signs: Temp:  100.2 HR: 67 A-Line:  86/76 (81) O2 Sat: 92% on 40% FIO2 and 5 peep Wt:  277>192>285 lbs   LVAD interrogation reveals:  Speed: 5600 Flow:  4.3 Power:  4.1w PI:  2.7 Hct: 37  Alarms:  one Low Flow at time of implant  Events:  none  Fixed speed:  5600 Low speed limit: 5300  Drive Line: Existing VAD dressing removed and site care performed using sterile technique. Drive line exit site cleaned with Chlora prep applicators x 2, allowed to dry, and gauze dressing with silver strip applied. Exit site with one suture intact, the velour is fully implanted at exit site; moderate amount serous drainage with no redness, tenderness, or foul odor noted. Drive line anchor re-applied. Will continue daily dressing changes per VAD Coordinator, Nurse Davonna Belling, or trained caregiver.   Labs:  LDH trend: 318  INR trend: 1.3  Anticoagulation Plan: -INR Goal: 2.0 - 2.5  -ASA Dose: none today  Blood Products:  - 07/21/21 2 units FFP (intra op)  Device: - [employer] single ICD -Therapies: off   Respiratory: Intubated on 40% FIO2 and 5 pee[  VAD Education: Unable to teach; pt remains intubated and is very sleepy; no family present.    Plan/Recommendations:  Call VAD Coordinator if any VAD equipment or drive line issues. Daily dressing changes per VAD Coordinator, Nurse Davonna Belling, or trained caregiver.   Zada Girt RN, VAD Coordinator 24/7 VAD Pager: [phone]

## 2021-07-22 NOTE — TOC Progression Note (Addendum)
Transition of Care [facility]) - Progression Note    Patient Details  Name: Jeffrey [name] MRN: [id number] Date of Birth: 1976/08/20  Transition of Care [facility]) CM/SW [place], [place] Phone Number: 07/22/2021, 4:43 PM  Clinical Narrative:    HF CSW stopped by [name] room today to check on him and the family and [name] was asleep and recovering from the LVAD surgery. CSW spoke with the patient's nurse who reported that [name] wife came by mid morning before heading to work with no current concerns.  CSW will continue to follow throughout discharge.     Barriers to Discharge: Continued Medical Work up  Expected Discharge Plan and Services   In-house Referral: Clinical Social Work Discharge Planning Services: CM Consult   Living arrangements for the past 2 months: Single Family Home                                       Social Determinants of Health (SDOH) Interventions Depression Interventions/Treatment : Counseling  Readmission Risk Interventions No flowsheet data found.  Pierre Cumpton, MSW, [place] Heart Failure Social Worker

## 2021-07-22 NOTE — Anesthesia Postprocedure Evaluation (Signed)
Anesthesia Post Note  Patient: Jeffrey [name]  Procedure(s) Performed: INSERTION OF IMPLANTABLE LEFT VENTRICULAR ASSIST DEVICE (Chest) TRANSESOPHAGEAL ECHOCARDIOGRAM (TEE) APPLICATION OF CELL SAVER     Patient location during evaluation: SICU Anesthesia Type: General Level of consciousness: sedated Pain management: pain level controlled Vital Signs Assessment: post-procedure vital signs reviewed and stable Respiratory status: patient remains intubated per anesthesia plan Cardiovascular status: stable Postop Assessment: no apparent nausea or vomiting Anesthetic complications: no   No notable events documented.  Last Vitals:  Vitals:   07/22/21 0915 07/22/21 0930  BP:    Pulse: 89 89  Resp: (!) 34 (!) 25  Temp: 37.5 C 37.6 C  SpO2: (!) 88% 93%    Last Pain:  Vitals:   07/22/21 0800  TempSrc: Core  PainSc:                  [name]

## 2021-07-22 NOTE — Progress Notes (Signed)
Patient ID: Jeffrey [name], male   DOB: 09-27-1976, 45 y.o.   MRN: [id number] TCTS Evening Rounds:  Hemodynamics stable today with MAP 80 on epi 2, milrinone 0.25. NE is off. LVAD parameters all stable  UO ok CT output low.  BMET    Component Value Date/Time   NA 138 07/22/2021 1700   NA 138 09/04/2020 1039   K 4.5 07/22/2021 1700   CL 105 07/22/2021 1651   CO2 22 07/22/2021 1651   GLUCOSE 135 (H) 07/22/2021 1651   BUN 18 07/22/2021 1651   BUN 23 09/04/2020 1039   CREATININE 1.55 (H) 07/22/2021 1651   CALCIUM 10.0 07/22/2021 1651   GFRNONAA 56 (L) 07/22/2021 1651   CBC    Component Value Date/Time   WBC 14.4 (H) 07/22/2021 1651   RBC 4.79 07/22/2021 1651   HGB 13.3 07/22/2021 1700   HGB 12.8 (L) 10/28/2020 0932   HCT 39.0 07/22/2021 1700   HCT 39.8 10/28/2020 0932   PLT 223 07/22/2021 1651   PLT 292 10/28/2020 0932   MCV 78.3 (L) 07/22/2021 1651   MCV 79 10/28/2020 0932   MCH 24.4 (L) 07/22/2021 1651   MCHC 31.2 07/22/2021 1651   RDW 15.8 (H) 07/22/2021 1651   RDW 15.1 10/28/2020 0932   LYMPHSABS 0.8 07/22/2021 0306   LYMPHSABS 2.9 10/28/2020 0932   MONOABS 1.0 07/22/2021 0306   EOSABS 0.0 07/22/2021 0306   EOSABS 0.2 10/28/2020 0932   BASOSABS 0.0 07/22/2021 0306   BASOSABS 0.1 10/28/2020 0932   Sats 91-94% on [place]. Needs to work on pain control and IS.

## 2021-07-22 NOTE — Progress Notes (Signed)
Patient ID: Jeffrey [name], male   DOB: [birth year], 45 y.o.   MRN: [id number] HeartMate 3 Rounding Note  Subjective:    Awake and alert on Precedex. Stable night.  CI 2.4, CVP 14, PA 45/24, CO-ox 59  Milrinone 0.25, Epi 2, NE 2.5  LVAD INTERROGATION:  HeartMate IIl LVAD:  Flow 4.3 liters/min, speed 5600, power 4, PI 2.9.  No events.  Objective:    Vital Signs:   Temp:  [96.4 F (35.8 C)-99.9 F (37.7 C)] 99.7 F (37.6 C) (10/18 0626) Pulse Rate:  [66-165] 89 (10/18 0626) Resp:  [10-34] 18 (10/18 0626) BP: (82-115)/(55-89) 91/81 (10/18 0600) SpO2:  [93 %-100 %] 94 % (10/18 0626) Arterial Line BP: (81-138)/(66-94) 89/77 (10/18 0626) FiO2 (%):  [50 %-100 %] 50 % (10/18 0304) Weight:  [129.4 kg] 129.4 kg (10/18 0500) Last BM Date: 07/18/21 Mean arterial Pressure 80's  Intake/Output:   Intake/Output Summary (Last 24 hours) at 07/22/2021 0639 Last data filed at 07/22/2021 0600 Gross per 24 hour  Intake 6488.86 ml  Output 3347 ml  Net 3141.86 ml     Physical Exam: General:  Well appearing. No resp difficulty HEENT: intubated Cor: Distant heart sounds with LVAD hum present. Lungs: clear Abdomen: soft, nontender, nondistended. Good bowel sounds. Extremities: mild edema Neuro: alert & orientedx3, moves all 4 extremities w/o difficulty. Affect pleasant  Telemetry: sinus  Labs: Basic Metabolic Panel: Recent Labs  Lab 07/20/21 0000 07/20/21 0001 07/20/21 1107 07/20/21 1539 07/20/21 2045 07/21/21 0408 07/21/21 0823 07/21/21 1107 07/21/21 1110 07/21/21 1130 07/21/21 1133 07/21/21 1225 07/21/21 1324 07/21/21 1337 07/21/21 1815 07/21/21 1824 07/22/21 0024 07/22/21 0025 07/22/21 0306  NA  --    < > 136   < > 132* 132*   < > 139   < > 140   < > 141 139 141  --  139  --  139 136  K  --    < > 4.2   < > 4.3 4.3   < > 4.2   < > 3.7   < > 3.6 3.6 3.5  --  4.4  --  4.7 4.7  CL  --    < > 102  --  101 100   < > 103  --  103  --  104 106  --   --   --   --   --  106   CO2  --    < > 27  --  24 24  --   --   --   --   --   --  24  --   --   --   --   --  21*  GLUCOSE  --    < > 116*  --  183* 209*   < > 178*  --  181*  --  194* 190*  --   --   --   --   --  119*  BUN  --    < > 21*  --  27* 26*   < > 25*  --  26*  --  25* 23*  --   --   --   --   --  20  CREATININE  --    < > 1.53*  --  1.68* 1.60*   < > 1.60*  --  1.60*  --  1.50* 1.73*  --  1.59*  --   --   --  1.42*  CALCIUM  --    < >  9.9  --  9.8 9.8  --   --   --   --   --   --  9.1  --   --   --   --   --  9.8  MG 1.8  --   --   --   --   --   --   --   --   --   --   --  2.0  --  3.0*  --  2.4  --  2.4  PHOS  --   --   --   --   --   --   --   --   --   --   --   --   --   --   --   --   --   --  3.5   < > = values in this interval not displayed.    Liver Function Tests: Recent Labs  Lab 07/20/21 1107 07/22/21 0306  AST 20 60*  ALT 42 29  ALKPHOS 69 56  BILITOT 0.7 0.8  PROT 7.4 6.4*  ALBUMIN 3.4* 2.9*   No results for input(s): LIPASE, AMYLASE in the last 168 hours. No results for input(s): AMMONIA in the last 168 hours.  CBC: Recent Labs  Lab 07/21/21 0408 07/21/21 0823 07/21/21 1324 07/21/21 1337 07/21/21 1408 07/21/21 1815 07/21/21 1824 07/22/21 0024 07/22/21 0025 07/22/21 0306  WBC 8.3  --  15.1*  --   --  10.2  --  10.1  --  10.4  NEUTROABS  --   --   --   --   --   --   --   --   --  8.5*  HGB 13.6   < > 11.1*   < >  --  11.5* 11.9* 11.3* 12.2* 11.2*  HCT 42.8   < > 36.5*   < >  --  36.3* 35.0* 35.6* 36.0* 35.8*  MCV 77.8*  --  79.3*  --   --  77.9*  --  77.6*  --  78.5*  PLT 327   < > 241  --  239 249  --  240  --  230   < > = values in this interval not displayed.    INR: Recent Labs  Lab 07/20/21 0500 07/21/21 1408 07/22/21 0306  INR 1.2 1.3* 1.3*    Other results: EKG:   Imaging: DG Chest Port 1 View  Result Date: 07/21/2021 CLINICAL DATA:  LVAD EXAM: PORTABLE CHEST 1 VIEW COMPARISON:  Radiograph 07/20/2021 FINDINGS: Postsurgical changes of median  sternotomy and LVAD. There is a pulmonary artery catheter overlying the right pulmonary artery. There is a left sided chest tube in place. Endotracheal tube tip overlies the midthoracic trachea. There is a nasogastric tube with side port overlying the stomach. There is a mediastinal drain overlying the lower chest. There is a subcutaneous AICD lead unchanged in position. Diffuse interstitial and confluent opacities. No large pleural effusion or visible pneumothorax. IMPRESSION: Postoperative chest with new LVAD and support lines and tubes as described above. Mild-to-moderate pulmonary edema. Electronically Signed   By: Maurine Simmering M.D.   On: 07/21/2021 14:00   DG CHEST PORT 1 VIEW  Result Date: 07/20/2021 CLINICAL DATA:  Check central line placement EXAM: PORTABLE CHEST 1 VIEW COMPARISON:  Film from earlier in the same day. FINDINGS: Gordy Councilman catheter has been withdrawn and now lies in the main right pulmonary artery. Defibrillator is again  seen. Cardiac shadow remains enlarged. The lungs are clear. IMPRESSION: Slight withdrawal of Swan-Ganz catheter as described. Electronically Signed   By: [name] Catalina M.D.   On: 07/20/2021 22:27   DG CHEST PORT 1 VIEW  Result Date: 07/20/2021 CLINICAL DATA:  Check central line placement EXAM: PORTABLE CHEST 1 VIEW COMPARISON:  07/18/2021 FINDINGS: Cardiac shadow is enlarged but stable. Swan-Ganz catheter is again noted in the right pulmonary artery although somewhat deeper in the lower lobe branches when compared with the prior study. No pneumothorax is noted. No focal infiltrate is seen. Defibrillator is noted anteriorly. No bony abnormality is noted. IMPRESSION: Swan-Ganz catheter is been advanced somewhat deeper into the right pulmonary artery. No other focal abnormality is noted. Electronically Signed   By: [name] Catalina M.D.   On: 07/20/2021 21:03     Medications:     Scheduled Medications:  acetaminophen  1,000 mg Oral Q6H   Or   acetaminophen (TYLENOL)  oral liquid 160 mg/5 mL  1,000 mg Per Tube Q6H   bisacodyl  10 mg Oral Daily   Or   bisacodyl  10 mg Rectal Daily   chlorhexidine gluconate (MEDLINE KIT)  15 mL Mouth Rinse BID   Chlorhexidine Gluconate Cloth  6 each Topical Daily   docusate sodium  200 mg Oral Daily   mouth rinse  15 mL Mouth Rinse 10 times per day   mupirocin ointment  1 application Nasal BID   [START ON 07/23/2021] pantoprazole  40 mg Oral Daily   sertraline  25 mg Oral Daily   sodium chloride flush  3 mL Intravenous Q12H   traZODone  100 mg Per Tube QHS    Infusions:  sodium chloride 20 mL/hr at 07/22/21 0600   sodium chloride 20 mL/hr at 07/21/21 1423   albumin human Stopped (07/21/21 1516)   And   sodium chloride     amiodarone 30 mg/hr (07/22/21 0600)    ceFAZolin (ANCEF) IV Stopped (07/22/21 0539)   dexmedetomidine (PRECEDEX) IV infusion 1 mcg/kg/hr (07/22/21 0616)   epinephrine 2 mcg/min (07/22/21 0600)   fluconazole (DIFLUCAN) IV     insulin 2.8 Units/hr (07/22/21 0600)   lactated ringers     lactated ringers     lactated ringers 20 mL/hr at 07/22/21 0600   milrinone 0.25 mcg/kg/min (07/22/21 0600)   norepinephrine (LEVOPHED) Adult infusion 2.5 mcg/min (07/22/21 0600)   rifampin (RIFADIN) IVPB     vancomycin Stopped (07/21/21 2038)    PRN Medications: sodium chloride, albumin human **AND** sodium chloride, dextrose, lactated ringers, midazolam, morphine injection, ondansetron (ZOFRAN) IV, oxyCODONE, sodium chloride flush, traMADol   Assessment:   POD 1 s/p HM 3 LVAD for acute on chronic systolic heart failure due to NICM. Mild to moderate RV dysfunction with normal valves.  Stage 3 CKD with solitary kidney. Baseline creat 1.6-2 over the past month but was up to 4.6 at end of July. 1.5 preop.  Hematuria with MRI and cysto ok.   OSA on CPAP  PAF: on amio IV. S/p DCCV in August.  Poorly controlled DM  Plan/Discussion:    He has remained stable postop and weaned NO to 2 ppm. Plan to  extubate this am.  Keep chest tubes in.  May benefit from some diuresis.  May be able to increase speed  after extubation.  Plan to start Coumadin tonight.  I reviewed the LVAD parameters from today, and compared the results to the patient's prior recorded data.  No programming changes were made.  The LVAD is functioning within specified parameters.    Length of Stay: 4  Gaye Pollack 07/22/2021, 6:39 AM

## 2021-07-22 NOTE — Progress Notes (Signed)
OT Cancellation Note  Patient Details Name: Jeffrey [name] MRN: [id number] DOB: Oct 14, 1975   Cancelled Treatment:    Reason Eval/Treat Not Completed: Other (comment) (Will assess tomorrow if appropriate.)  [facility] Maurie Boettcher, OT/L   Acute OT Clinical Specialist Acute Rehabilitation Services Pager [phone] Office [phone]  07/22/2021, 6:39 AM

## 2021-07-22 NOTE — Plan of Care (Signed)

## 2021-07-22 NOTE — Procedures (Signed)
Extubation Procedure Note  Patient Details:   Name: Jeffrey [name] DOB: [birth year] MRN: [id number]   Airway Documentation:    Vent end date: 07/22/21 Vent end time: 1115   Evaluation  O2 sats: stable throughout Complications: No apparent complications Patient did tolerate procedure well. Bilateral Breath Sounds: Clear, Diminished   Yes Placed on 4 L [place]  Ned Grace 07/22/2021, 11:22 AM

## 2021-07-22 NOTE — Progress Notes (Addendum)
Advanced Heart Failure VAD Team Note  PCP-Cardiologist: Glori Bickers, MD   Subjective:    10/17: HM3 LVAD implant (DT)    POD#1. Remains intubated, awake and alert on vent.    On Epi 3, NE 2.5, Milrinone 0.25, Amio gtt at 30/hr. NO weaned down to 1 this morning.    Co-ox marginal at 59.5%. CI low 1.92.   CVP 17. 8 lb above pre-op wt.   1.8L in UOP yesterday. SCr stable 1.42  CT Output  670 cc. Hgb stable at 11.2    Swan #s  CVP 17 PAP 46/27 (33) CI 1.92 CO 4.56  PAPi 1.1   MAP 84   LVAD INTERROGATION:  HeartMate III LVAD:   Flow 4.2 liters/min, speed 5650, power 4.0, PI 3.2. No PI events   Objective:    Vital Signs:   Temp:  [96.4 F (35.8 C)-99.9 F (37.7 C)] 99.5 F (37.5 C) (10/18 0700) Pulse Rate:  [66-165] 72 (10/18 0700) Resp:  [10-34] 32 (10/18 0700) BP: (82-115)/(55-89) 84/75 (10/18 0700) SpO2:  [93 %-100 %] 97 % (10/18 0700) Arterial Line BP: (81-138)/(66-94) 87/76 (10/18 0700) FiO2 (%):  [50 %-100 %] 50 % (10/18 0304) Weight:  [129.4 kg] 129.4 kg (10/18 0500) Last BM Date: 07/18/21 Mean arterial Pressure 84   Intake/Output:   Intake/Output Summary (Last 24 hours) at 07/22/2021 0746 Last data filed at 07/22/2021 0700 Gross per 24 hour  Intake 6605.43 ml  Output 3387 ml  Net 3218.43 ml     Physical Exam    CVP 17  General:  intubated and awake on vent, interactive. No distress  HEENT: normal + ETT Neck: supple. Thick neck, JVD not well visualized. + Lt IJ Swan. Carotids 2+ bilat; no bruits. No lymphadenopathy or thyromegaly appreciated. Cor: Mechanical heart sounds with LVAD hum present. + sternal dressing + CT Lungs: intubated and clear  Abdomen: soft, nontender, nondistended. No hepatosplenomegaly. No bruits or masses. Good bowel sounds. Driveline: C/D/I; securement device intact and driveline incorporated Extremities: no cyanosis, clubbing, rash, trace bilateral LEE edema Neuro: intubated. Awake on vent and interactive     Telemetry   NSR 90s   EKG    NSR 65 bpm, prolonged QT/QTcB 550/572 ms  Labs   Basic Metabolic Panel: Recent Labs  Lab 07/20/21 0000 07/20/21 0001 07/20/21 1107 07/20/21 1539 07/20/21 2045 07/21/21 0408 07/21/21 0823 07/21/21 1107 07/21/21 1110 07/21/21 1130 07/21/21 1133 07/21/21 1225 07/21/21 1324 07/21/21 1337 07/21/21 1815 07/21/21 1824 07/22/21 0024 07/22/21 0025 07/22/21 0306  NA  --    < > 136   < > 132* 132*   < > 139   < > 140   < > 141 139 141  --  139  --  139 136  K  --    < > 4.2   < > 4.3 4.3   < > 4.2   < > 3.7   < > 3.6 3.6 3.5  --  4.4  --  4.7 4.7  CL  --    < > 102  --  101 100   < > 103  --  103  --  104 106  --   --   --   --   --  106  CO2  --    < > 27  --  24 24  --   --   --   --   --   --  24  --   --   --   --   --  21*  GLUCOSE  --    < > 116*  --  183* 209*   < > 178*  --  181*  --  194* 190*  --   --   --   --   --  119*  BUN  --    < > 21*  --  27* 26*   < > 25*  --  26*  --  25* 23*  --   --   --   --   --  20  CREATININE  --    < > 1.53*  --  1.68* 1.60*   < > 1.60*  --  1.60*  --  1.50* 1.73*  --  1.59*  --   --   --  1.42*  CALCIUM  --    < > 9.9  --  9.8 9.8  --   --   --   --   --   --  9.1  --   --   --   --   --  9.8  MG 1.8  --   --   --   --   --   --   --   --   --   --   --  2.0  --  3.0*  --  2.4  --  2.4  PHOS  --   --   --   --   --   --   --   --   --   --   --   --   --   --   --   --   --   --  3.5   < > = values in this interval not displayed.    Liver Function Tests: Recent Labs  Lab 07/20/21 1107 07/22/21 0306  AST 20 60*  ALT 42 29  ALKPHOS 69 56  BILITOT 0.7 0.8  PROT 7.4 6.4*  ALBUMIN 3.4* 2.9*   No results for input(s): LIPASE, AMYLASE in the last 168 hours. No results for input(s): AMMONIA in the last 168 hours.  CBC: Recent Labs  Lab 07/21/21 0408 07/21/21 0823 07/21/21 1324 07/21/21 1337 07/21/21 1408 07/21/21 1815 07/21/21 1824 07/22/21 0024 07/22/21 0025 07/22/21 0306  WBC 8.3   --  15.1*  --   --  10.2  --  10.1  --  10.4  NEUTROABS  --   --   --   --   --   --   --   --   --  8.5*  HGB 13.6   < > 11.1*   < >  --  11.5* 11.9* 11.3* 12.2* 11.2*  HCT 42.8   < > 36.5*   < >  --  36.3* 35.0* 35.6* 36.0* 35.8*  MCV 77.8*  --  79.3*  --   --  77.9*  --  77.6*  --  78.5*  PLT 327   < > 241  --  239 249  --  240  --  230   < > = values in this interval not displayed.    INR: Recent Labs  Lab 07/20/21 0500 07/21/21 1408 07/22/21 0306  INR 1.2 1.3* 1.3*    Other results: EKG:    Imaging   DG Chest Port 1 View  Result Date: 07/21/2021 CLINICAL DATA:  LVAD EXAM: PORTABLE CHEST 1 VIEW COMPARISON:  Radiograph 07/20/2021 FINDINGS: Postsurgical changes of median sternotomy and LVAD. There is a pulmonary  artery catheter overlying the right pulmonary artery. There is a left sided chest tube in place. Endotracheal tube tip overlies the midthoracic trachea. There is a nasogastric tube with side port overlying the stomach. There is a mediastinal drain overlying the lower chest. There is a subcutaneous AICD lead unchanged in position. Diffuse interstitial and confluent opacities. No large pleural effusion or visible pneumothorax. IMPRESSION: Postoperative chest with new LVAD and support lines and tubes as described above. Mild-to-moderate pulmonary edema. Electronically Signed   By: Maurine Simmering M.D.   On: 07/21/2021 14:00   DG CHEST PORT 1 VIEW  Result Date: 07/20/2021 CLINICAL DATA:  Check central line placement EXAM: PORTABLE CHEST 1 VIEW COMPARISON:  Film from earlier in the same day. FINDINGS: Gordy Councilman catheter has been withdrawn and now lies in the main right pulmonary artery. Defibrillator is again seen. Cardiac shadow remains enlarged. The lungs are clear. IMPRESSION: Slight withdrawal of Swan-Ganz catheter as described. Electronically Signed   By: [name] Catalina M.D.   On: 07/20/2021 22:27   DG CHEST PORT 1 VIEW  Result Date: 07/20/2021 CLINICAL DATA:  Check central  line placement EXAM: PORTABLE CHEST 1 VIEW COMPARISON:  07/18/2021 FINDINGS: Cardiac shadow is enlarged but stable. Swan-Ganz catheter is again noted in the right pulmonary artery although somewhat deeper in the lower lobe branches when compared with the prior study. No pneumothorax is noted. No focal infiltrate is seen. Defibrillator is noted anteriorly. No bony abnormality is noted. IMPRESSION: Swan-Ganz catheter is been advanced somewhat deeper into the right pulmonary artery. No other focal abnormality is noted. Electronically Signed   By: [name] Catalina M.D.   On: 07/20/2021 21:03     Medications:     Scheduled Medications:  acetaminophen  1,000 mg Oral Q6H   Or   acetaminophen (TYLENOL) oral liquid 160 mg/5 mL  1,000 mg Per Tube Q6H   bisacodyl  10 mg Oral Daily   Or   bisacodyl  10 mg Rectal Daily   chlorhexidine gluconate (MEDLINE KIT)  15 mL Mouth Rinse BID   Chlorhexidine Gluconate Cloth  6 each Topical Daily   docusate sodium  200 mg Oral Daily   mouth rinse  15 mL Mouth Rinse 10 times per day   mupirocin ointment  1 application Nasal BID   [START ON 07/23/2021] pantoprazole  40 mg Oral Daily   sertraline  25 mg Oral Daily   sodium chloride flush  3 mL Intravenous Q12H   traZODone  100 mg Per Tube QHS   warfarin  5 mg Oral q1600   Warfarin - Physician Dosing Inpatient   Does not apply q1600    Infusions:  sodium chloride 20 mL/hr at 07/22/21 0700   sodium chloride 20 mL/hr at 07/21/21 1423   albumin human Stopped (07/21/21 1516)   And   sodium chloride     amiodarone 30 mg/hr (07/22/21 0700)    ceFAZolin (ANCEF) IV Stopped (07/22/21 0539)   dexmedetomidine (PRECEDEX) IV infusion 0.6 mcg/kg/hr (07/22/21 0700)   epinephrine 2 mcg/min (07/22/21 0700)   fluconazole (DIFLUCAN) IV 400 mg (07/22/21 0714)   insulin 3.4 Units/hr (07/22/21 0700)   lactated ringers     lactated ringers     lactated ringers 20 mL/hr at 07/22/21 0700   milrinone 0.25 mcg/kg/min (07/22/21 0700)    norepinephrine (LEVOPHED) Adult infusion 2.5 mcg/min (07/22/21 0700)   rifampin (RIFADIN) IVPB     vancomycin Stopped (07/21/21 2038)    PRN Medications: sodium chloride, albumin human **  AND** sodium chloride, dextrose, lactated ringers, midazolam, morphine injection, ondansetron (ZOFRAN) IV, oxyCODONE, sodium chloride flush, traMADol   Assessment/Plan:    1. Acute on Chronic systolic CHF - Nonischemic CM, end-stage - Echo (9/21): EF 20-25%.   - Echo  8/22: EF < 20%, severe LV dilation, moderate RV dysfunction.   - RHC (8/22): well compensated hemodynamics. - s/p S-ICD - NYHA III-IIIB  - Body mass index is 45.42 kg/m. Not transplant candidate - RHC 10/14 with elevated filling pressures and low CO. Started milrinone 0.25 for optimization prior to VAD - S/p HM3 LVAD 10/17 (DT) - On milrinone 0.25, + Epi 3, NE 2.5, NO2 1.  - Co-ox marginal 59%. CI low 1.9. Increase VAD speed today  - Continue current gtts, wean as tolerated per CT surgery  - CVP 17, begin gentle diuresis  - Hopefully extubate today, 40 mg IV x 1    2 Atrial fibrillation: Paroxysmal - Emergent TEE-guided DCCV 7/27 due to inability to control HR. - Recurrent AF on 8/5. s/p DC-CV on 05/10/21.  - Seen by EP. Not a candidate for ablation due to body habitus  (could consider AVN ablation and CRT, if needed)  - In NSR today - Continue IV amio to prevent recurrent VT while on inotropes  - off a/c for now post-op, resume once cleared by CT surgery    3. CKD stage 3B  - Has solitary kidney.   - Baseline creatinine is 1.6-2.9.  - Avoid hypotension. - Improving with support => 1.4 today.    4. OSA - Continue CPAP after extubation.    5. DM2. Poorly controlled - On Jardiance. - Hgb A1c 9.4. - Cover with SSI   6. Hematuria - MRI and cystoscopy ok    I reviewed the LVAD parameters from today, and compared the results to the patient's prior recorded data.  No programming changes were made.  The LVAD is functioning  within specified parameters.  The patient performs LVAD self-test daily.  LVAD interrogation was negative for any significant power changes, alarms or PI events/speed drops.  LVAD equipment check completed and is in good working order.  Back-up equipment present.   LVAD education done on emergency procedures and precautions and reviewed exit site care.  Length of Stay: [address] [place] 07/22/2021, 7:46 AM  VAD Team --- VAD ISSUES ONLY--- Pager [phone] (7am - 7am)  Advanced Heart Failure Team  Pager [phone] (M-F; 7a - 5p)  Please contact [facility] for night-coverage after hours (5p -7a ) and weekends on [email or web address]  Agree with above.   Remains intubated. Awake on vent. Following commands CTs dry. Off iNO. Co-ox 59% on milrinone 0.25, + Epi 3, NE 2.5  CVP elevated  General: Awake on vent following commands HEENT: normal  + ETT Neck: supple. RIJ swan Carotids 2+ bilat; no bruits. No lymphadenopathy or thryomegaly appreciated. Cor: sternal dressing ok  LVAD hum.   Lungs: Clear. Abdomen: obese soft, nontender, non-distended. No hepatosplenomegaly. No bruits or masses. Good bowel sounds. Driveline site clean. Anchor in place.  Extremities: no cyanosis, clubbing, rash. Warm no edema  Neuro:awake on vent following commands  VAD parameters look good. Continue current settings. Diurese. Work toward extubation today. Continue inotropes   CRITICAL CARE Performed by: Glori Bickers  Total critical care time: 35 minutes  Critical care time was exclusive of separately billable procedures and treating other patients.  Critical care was necessary to treat or prevent imminent or life-threatening deterioration.  Critical care  was time spent personally by me (independent of midlevel providers or residents) on the following activities: development of treatment plan with patient and/or surrogate as well as nursing, discussions with consultants, evaluation of patient's response to  treatment, examination of patient, obtaining history from patient or surrogate, ordering and performing treatments and interventions, ordering and review of laboratory studies, ordering and review of radiographic studies, pulse oximetry and re-evaluation of patient's condition.   Glori Bickers, MD  4:14 PM

## 2021-07-22 NOTE — Progress Notes (Signed)
EKG CRITICAL VALUE     12 lead EKG performed.  Critical value noted.  Jeannette Corpus, RN notified.   Ouida Sills, CCT 07/22/2021 7:42 AM

## 2021-07-23 ENCOUNTER — Other Ambulatory Visit (HOSPITAL_COMMUNITY): Payer: Self-pay

## 2021-07-23 ENCOUNTER — Inpatient Hospital Stay (HOSPITAL_COMMUNITY): Payer: Medicaid Other

## 2021-07-23 ENCOUNTER — Encounter (HOSPITAL_COMMUNITY): Payer: Self-pay | Admitting: Surgery

## 2021-07-23 DIAGNOSIS — I480 Paroxysmal atrial fibrillation: Secondary | ICD-10-CM | POA: Diagnosis not present

## 2021-07-23 DIAGNOSIS — J811 Chronic pulmonary edema: Secondary | ICD-10-CM | POA: Diagnosis not present

## 2021-07-23 DIAGNOSIS — N183 Chronic kidney disease, stage 3 unspecified: Secondary | ICD-10-CM | POA: Diagnosis not present

## 2021-07-23 DIAGNOSIS — I428 Other cardiomyopathies: Secondary | ICD-10-CM | POA: Diagnosis not present

## 2021-07-23 DIAGNOSIS — I517 Cardiomegaly: Secondary | ICD-10-CM | POA: Diagnosis not present

## 2021-07-23 DIAGNOSIS — Z95811 Presence of heart assist device: Secondary | ICD-10-CM | POA: Diagnosis not present

## 2021-07-23 DIAGNOSIS — I5023 Acute on chronic systolic (congestive) heart failure: Secondary | ICD-10-CM | POA: Diagnosis not present

## 2021-07-23 DIAGNOSIS — I272 Pulmonary hypertension, unspecified: Secondary | ICD-10-CM | POA: Diagnosis not present

## 2021-07-23 DIAGNOSIS — R079 Chest pain, unspecified: Secondary | ICD-10-CM | POA: Diagnosis not present

## 2021-07-23 DIAGNOSIS — E119 Type 2 diabetes mellitus without complications: Secondary | ICD-10-CM | POA: Diagnosis not present

## 2021-07-23 DIAGNOSIS — I5022 Chronic systolic (congestive) heart failure: Secondary | ICD-10-CM | POA: Diagnosis not present

## 2021-07-23 LAB — GLUCOSE, CAPILLARY
Glucose-Capillary: 183 mg/dL — ABNORMAL HIGH (ref 70–99)
Glucose-Capillary: 214 mg/dL — ABNORMAL HIGH (ref 70–99)
Glucose-Capillary: 234 mg/dL — ABNORMAL HIGH (ref 70–99)
Glucose-Capillary: 211 mg/dL — ABNORMAL HIGH (ref 70–99)
Glucose-Capillary: 178 mg/dL — ABNORMAL HIGH (ref 70–99)
Glucose-Capillary: 163 mg/dL — ABNORMAL HIGH (ref 70–99)

## 2021-07-23 LAB — URINALYSIS, ROUTINE W REFLEX MICROSCOPIC
Bacteria, UA: NONE SEEN
Bilirubin Urine: NEGATIVE
Glucose, UA: >=500 mg/dL — AB
Ketones, ur: NEGATIVE mg/dL
Leukocytes,Ua: NEGATIVE
Nitrite: NEGATIVE
Protein, ur: NEGATIVE mg/dL
Specific Gravity, Urine: 1.028 (ref 1.005–1.030)
pH: 5 (ref 5.0–8.0)

## 2021-07-23 LAB — COMPREHENSIVE METABOLIC PANEL
ALT: 19 U/L (ref 0–44)
AST: 45 U/L — ABNORMAL HIGH (ref 15–41)
Albumin: 2.5 g/dL — ABNORMAL LOW (ref 3.5–5.0)
Alkaline Phosphatase: 74 U/L (ref 38–126)
Anion gap: 8 (ref 5–15)
BUN: 20 mg/dL (ref 6–20)
CO2: 22 mmol/L (ref 22–32)
Calcium: 9.9 mg/dL (ref 8.9–10.3)
Chloride: 101 mmol/L (ref 98–111)
Creatinine, Ser: 1.63 mg/dL — ABNORMAL HIGH (ref 0.61–1.24)
GFR, Estimated: 52 mL/min — ABNORMAL LOW (ref >60)
Glucose, Bld: 207 mg/dL — ABNORMAL HIGH (ref 70–99)
Potassium: 4.9 mmol/L (ref 3.5–5.1)
Sodium: 131 mmol/L — ABNORMAL LOW (ref 135–145)
Total Bilirubin: 1.1 mg/dL (ref 0.3–1.2)
Total Protein: 6.5 g/dL (ref 6.5–8.1)

## 2021-07-23 LAB — EXPECTORATED SPUTUM ASSESSMENT W GRAM STAIN, RFLX TO RESP C

## 2021-07-23 LAB — CBC WITH DIFFERENTIAL/PLATELET
Abs Immature Granulocytes: 0.09 10*3/uL — ABNORMAL HIGH (ref 0.00–0.07)
Basophils Absolute: 0.1 10*3/uL (ref 0.0–0.1)
Basophils Relative: 0 %
Eosinophils Absolute: 0 10*3/uL (ref 0.0–0.5)
Eosinophils Relative: 0 %
HCT: 35.7 % — ABNORMAL LOW (ref 39.0–52.0)
Hemoglobin: 11.2 g/dL — ABNORMAL LOW (ref 13.0–17.0)
Immature Granulocytes: 1 %
Lymphocytes Relative: 6 %
Lymphs Abs: 1.1 10*3/uL (ref 0.7–4.0)
MCH: 24.5 pg — ABNORMAL LOW (ref 26.0–34.0)
MCHC: 31.4 g/dL (ref 30.0–36.0)
MCV: 78.1 fL — ABNORMAL LOW (ref 80.0–100.0)
Monocytes Absolute: 2.1 10*3/uL — ABNORMAL HIGH (ref 0.1–1.0)
Monocytes Relative: 11 %
Neutro Abs: 15.6 10*3/uL — ABNORMAL HIGH (ref 1.7–7.7)
Neutrophils Relative %: 82 %
Platelets: 228 10*3/uL (ref 150–400)
RBC: 4.57 MIL/uL (ref 4.22–5.81)
RDW: 15.9 % — ABNORMAL HIGH (ref 11.5–15.5)
WBC: 19 10*3/uL — ABNORMAL HIGH (ref 4.0–10.5)
nRBC: 0 % (ref 0.0–0.2)

## 2021-07-23 LAB — PHOSPHORUS: Phosphorus: 4.7 mg/dL — ABNORMAL HIGH (ref 2.5–4.6)

## 2021-07-23 LAB — COOXEMETRY PANEL
Carboxyhemoglobin: 1.3 % (ref 0.5–1.5)
Methemoglobin: 0.8 % (ref 0.0–1.5)
O2 Saturation: 68.4 %
Total hemoglobin: 9.8 g/dL — ABNORMAL LOW (ref 12.0–16.0)

## 2021-07-23 LAB — PROTIME-INR
INR: 1.9 — ABNORMAL HIGH (ref 0.8–1.2)
Prothrombin Time: 21.5 seconds — ABNORMAL HIGH (ref 11.4–15.2)

## 2021-07-23 LAB — MAGNESIUM: Magnesium: 2.2 mg/dL (ref 1.7–2.4)

## 2021-07-23 LAB — LACTATE DEHYDROGENASE: LDH: 372 U/L — ABNORMAL HIGH (ref 98–192)

## 2021-07-23 LAB — VANCOMYCIN, TROUGH: Vancomycin Tr: 14 ug/mL — ABNORMAL LOW (ref 15–20)

## 2021-07-23 MED ORDER — SODIUM CHLORIDE 0.9 % IV SOLN
1.0000 g | Freq: Three times a day (TID) | INTRAVENOUS | Status: AC
  Administered 2021-07-23 – 2021-07-27 (×15): 1 g via INTRAVENOUS
  Filled 2021-07-23 (×15): qty 1

## 2021-07-23 MED ORDER — VANCOMYCIN HCL 1250 MG/250ML IV SOLN
1250.0000 mg | Freq: Two times a day (BID) | INTRAVENOUS | Status: DC
  Administered 2021-07-23 – 2021-07-27 (×8): 1250 mg via INTRAVENOUS
  Filled 2021-07-23 (×9): qty 250

## 2021-07-23 MED ORDER — INSULIN DETEMIR 100 UNIT/ML ~~LOC~~ SOLN
40.0000 [IU] | Freq: Two times a day (BID) | SUBCUTANEOUS | Status: DC
  Administered 2021-07-23 (×2): 40 [IU] via SUBCUTANEOUS
  Filled 2021-07-23 (×5): qty 0.4

## 2021-07-23 MED FILL — Heparin Sodium (Porcine) Inj 1000 Unit/ML: INTRAMUSCULAR | Qty: 30 | Status: AC

## 2021-07-23 MED FILL — Electrolyte-R (PH 7.4) Solution: INTRAVENOUS | Qty: 3000 | Status: AC

## 2021-07-23 MED FILL — Heparin Sodium (Porcine) Inj 1000 Unit/ML: INTRAMUSCULAR | Qty: 10 | Status: AC

## 2021-07-23 MED FILL — Sodium Chloride IV Soln 0.9%: INTRAVENOUS | Qty: 3000 | Status: AC

## 2021-07-23 MED FILL — Sodium Bicarbonate IV Soln 8.4%: INTRAVENOUS | Qty: 50 | Status: AC

## 2021-07-23 NOTE — Progress Notes (Signed)
Patient ID: Jeffrey [name], male   DOB: 1976/09/14, 45 y.o.   MRN: [id number] HeartMate 3 Rounding Note  Subjective:    No complaints. Pain fairly well controlled. Slept some using CPAP. Stood up to weigh and that was tough.  CI 2.8, CVP 14, Co-ox 68.4 on milrinone 0.25 and epi 2.   LVAD INTERROGATION:  HeartMate IIl LVAD:  Flow 4.2 liters/min, speed 5600, power 4, PI 3.1.     Objective:    Vital Signs:   Temp:  [99.5 F (37.5 C)-100.8 F (38.2 C)] 100 F (37.8 C) (10/19 5366) Pulse Rate:  [65-200] 89 (10/19 0652) Resp:  [6-41] 28 (10/19 0652) BP: (91-134)/(80-115) 134/115 (10/18 1200) SpO2:  [70 %-99 %] 94 % (10/19 4403) Arterial Line BP: (79-126)/(68-90) 126/76 (10/19 0652) FiO2 (%):  [40 %] 40 % (10/18 0748) Weight:  [134.4 kg] 134.4 kg (10/19 0652) Last BM Date: 07/18/21 Mean arterial Pressure 80's by arterial line and 100 by doppler.  Intake/Output:   Intake/Output Summary (Last 24 hours) at 07/23/2021 0728 Last data filed at 07/23/2021 0700 Gross per 24 hour  Intake 2912.42 ml  Output 2665 ml  Net 247.42 ml     Physical Exam: General:  Well appearing. No resp difficulty Cor: distant heart sounds with LVAD hum present. Lungs: clear Abdomen: soft, nontender, nondistended. Good bowel sounds. Extremities: mild edema Neuro: alert & orientedx3, cranial nerves grossly intact. moves all 4 extremities w/o difficulty. Affect pleasant  Telemetry: sinus 88  Labs: Basic Metabolic Panel: Recent Labs  Lab 07/21/21 0408 07/21/21 0823 07/21/21 1225 07/21/21 1324 07/21/21 1337 07/21/21 1815 07/21/21 1824 07/22/21 0024 07/22/21 0025 07/22/21 0306 07/22/21 1205 07/22/21 1218 07/22/21 1651 07/22/21 1700 07/23/21 0424  NA 132*   < > 141 139   < >  --    < >  --    < > 136 142 139 135 138 131*  K 4.3   < > 3.6 3.6   < >  --    < >  --    < > 4.7 3.5 4.2 4.4 4.5 4.9  CL 100   < > 104 106  --   --   --   --   --  106  --   --  105  --  101  CO2 24  --   --  24  --    --   --   --   --  21*  --   --  22  --  22  GLUCOSE 209*   < > 194* 190*  --   --   --   --   --  119*  --   --  135*  --  207*  BUN 26*   < > 25* 23*  --   --   --   --   --  20  --   --  18  --  20  CREATININE 1.60*   < > 1.50* 1.73*  --  1.59*  --   --   --  1.42*  --   --  1.55*  --  1.63*  CALCIUM 9.8  --   --  9.1  --   --   --   --   --  9.8  --   --  10.0  --  9.9  MG  --   --   --  2.0  --  3.0*  --  2.4  --  2.4  --   --  1.9  --  2.2  PHOS  --   --   --   --   --   --   --   --   --  3.5  --   --   --   --  4.7*   < > = values in this interval not displayed.    Liver Function Tests: Recent Labs  Lab 07/20/21 1107 07/22/21 0306 07/23/21 0424  AST 20 60* 45*  ALT 42 29 19  ALKPHOS 69 56 74  BILITOT 0.7 0.8 1.1  PROT 7.4 6.4* 6.5  ALBUMIN 3.4* 2.9* 2.5*   No results for input(s): LIPASE, AMYLASE in the last 168 hours. No results for input(s): AMMONIA in the last 168 hours.  CBC: Recent Labs  Lab 07/21/21 1815 07/21/21 1824 07/22/21 0024 07/22/21 0025 07/22/21 0306 07/22/21 1205 07/22/21 1218 07/22/21 1651 07/22/21 1700 07/23/21 0424  WBC 10.2  --  10.1  --  10.4  --   --  14.4*  --  19.0*  NEUTROABS  --   --   --   --  8.5*  --   --   --   --  15.6*  HGB 11.5*   < > 11.3*   < > 11.2* 11.2* 12.2* 11.7* 13.3 11.2*  HCT 36.3*   < > 35.6*   < > 35.8* 33.0* 36.0* 37.5* 39.0 35.7*  MCV 77.9*  --  77.6*  --  78.5*  --   --  78.3*  --  78.1*  PLT 249  --  240  --  230  --   --  223  --  228   < > = values in this interval not displayed.    INR: Recent Labs  Lab 07/20/21 0500 07/21/21 1408 07/22/21 0306 07/23/21 0614  INR 1.2 1.3* 1.3* 1.9*    Other results: EKG:   Imaging: DG Chest Port 1 View  Result Date: 07/22/2021 CLINICAL DATA:  Ventilator support.  Left ventricular assist device. EXAM: PORTABLE CHEST 1 VIEW COMPARISON:  07/21/2021 FINDINGS: Endotracheal tube tip 6 cm above the carina. Orogastric or nasogastric tube enters the abdomen. Swan-Ganz  catheter tip in the right main pulmonary artery. Left ventricular assist device in place. Aortic balloon pump in place. Left chest tube in place. No pneumothorax. Enlarged cardiac silhouette appears unchanged. There may be minimally less edema than was seen yesterday. Mild bibasilar volume loss persists. IMPRESSION: Lines, tubes and support devices appear unchanged. No pneumothorax. Question slightly less edema. Electronically Signed   By: Nelson Chimes M.D.   On: 07/22/2021 08:37   DG Chest Port 1 View  Result Date: 07/21/2021 CLINICAL DATA:  LVAD EXAM: PORTABLE CHEST 1 VIEW COMPARISON:  Radiograph 07/20/2021 FINDINGS: Postsurgical changes of median sternotomy and LVAD. There is a pulmonary artery catheter overlying the right pulmonary artery. There is a left sided chest tube in place. Endotracheal tube tip overlies the midthoracic trachea. There is a nasogastric tube with side port overlying the stomach. There is a mediastinal drain overlying the lower chest. There is a subcutaneous AICD lead unchanged in position. Diffuse interstitial and confluent opacities. No large pleural effusion or visible pneumothorax. IMPRESSION: Postoperative chest with new LVAD and support lines and tubes as described above. Mild-to-moderate pulmonary edema. Electronically Signed   By: Maurine Simmering M.D.   On: 07/21/2021 14:00     Medications:     Scheduled Medications:  acetaminophen  1,000 mg Oral Q6H   Or   acetaminophen (  TYLENOL) oral liquid 160 mg/5 mL  1,000 mg Per Tube Q6H   bisacodyl  10 mg Oral Daily   Or   bisacodyl  10 mg Rectal Daily   chlorhexidine gluconate (MEDLINE KIT)  15 mL Mouth Rinse BID   Chlorhexidine Gluconate Cloth  6 each Topical Daily   docusate sodium  200 mg Oral Daily   insulin aspart  0-20 Units Subcutaneous Q4H   insulin detemir  40 Units Subcutaneous BID   mouth rinse  15 mL Mouth Rinse BID   mupirocin ointment  1 application Nasal BID   pantoprazole  40 mg Oral Daily   sertraline   25 mg Oral Daily   sodium chloride flush  3 mL Intravenous Q12H   traZODone  100 mg Per Tube QHS   Warfarin - Physician Dosing Inpatient   Does not apply q1600    Infusions:  sodium chloride Stopped (07/22/21 2203)   sodium chloride 20 mL/hr at 07/21/21 1423   albumin human Stopped (07/21/21 1516)   And   sodium chloride     amiodarone 30 mg/hr (07/23/21 0700)   epinephrine 2 mcg/min (07/23/21 0700)   lactated ringers     lactated ringers 20 mL/hr at 07/23/21 0700   milrinone 0.25 mcg/kg/min (07/23/21 0700)   norepinephrine (LEVOPHED) Adult infusion Stopped (07/22/21 1719)    PRN Medications: sodium chloride, albumin human **AND** sodium chloride, dextrose, morphine injection, ondansetron (ZOFRAN) IV, oxyCODONE, sodium chloride flush, traMADol  CXR: stable bibasilar atelectsis  Assessment:   POD 2 s/p HM 3 LVAD for acute on chronic systolic heart failure due to NICM. Mild to moderate RV dysfunction with normal valves.   Stage 3 CKD with solitary kidney. Baseline creat 1.6-2 over the past month but was up to 4.6 at end of July. 1.5 preop.  Hematuria with MRI and cysto ok.    OSA on CPAP   PAF: on amio IV. S/p DCCV in August.   Poorly controlled DM  Plan/Discussion:    Hemodynamics stable. Arterial waveform is pulsatile with each heart beat so can probably increase speed. Will leave that up to DB.  Can remove MT's today and leave left pleural tube and pocket drain.  Creat still about baseline. CVP 14-15. May benefit from further diuresis.  INR jumped to 1.9 after 5 mg Coumadin. Will hold for now and repeat INR in am.  Not on ASA due to allergy.  IS, OOB, Could probably DC swan.  I reviewed the LVAD parameters from today, and compared the results to the patient's prior recorded data.  No programming changes were made.  The LVAD is functioning within specified parameters.  Length of Stay: 5  Gaye Pollack 07/23/2021, 7:28 AM

## 2021-07-23 NOTE — Progress Notes (Addendum)
Pharmacy Antibiotic Note  Consulted for meropenem and vancomycin Indication: possible sepsis s/p LVAD placement on 10/17  DAYVION SANS is a 45 y.o. male admitted on 07/18/2021 for LVAD placement. LVAD was placed 10/17.  Pharmacy has been consulted meropenem and vancomycin dosing. Patient received rifampin and fluconazole in OR.  WBC elevated post VAD placement  on 10/18 and increased to 19 today. Renal function is currently stable. Slight temperature elevation overnight.  Will increase vancomycin dose today per nomogram and continue to monitor and check vancomycin level at steady state.  Plan: Start Meropenem 1 g every 8 hours Increase Vancomycin to 1250 mg every 12 hours  Height: 5\' 7"  (170.2 cm) Weight: 134.4 kg (296 lb 4.8 oz) IBW/kg (Calculated) : 66.1  Temp (24hrs), Avg:100.2 F (37.9 C), Min:99.3 F (37.4 C), Max:100.8 F (38.2 C)  Recent Labs  Lab 07/21/21 1324 07/21/21 1815 07/22/21 0024 07/22/21 0306 07/22/21 1651 07/23/21 0424 07/23/21 0841  WBC 15.1* 10.2 10.1 10.4 14.4* 19.0*  --   CREATININE 1.73* 1.59*  --  1.42* 1.55* 1.63*  --   VANCOTROUGH  --   --   --   --   --   --  14*     Estimated Creatinine Clearance: 74.8 mL/min (A) (by C-G formula based on SCr of 1.63 mg/dL (H)).    Allergies  Allergen Reactions   Aspirin Shortness Of Breath, Itching and Rash     Burning sensation (Patient reports he tolerates other NSAIDS)    Bee Venom Hives and Swelling   Lisinopril Cough   Shrimp (Diagnostic) Rash   Tomato Rash    Antimicrobials this admission: Cefazolin 10/17 >> 10/19 Vancomycin  10/17 >>  Rifampin 600 mg x 1 10/17 Fluconazole 400 mg x 1 10/17  Dose adjustments this admission: 10/19 Vancomycin 1 g q12h to 1.25 g q12h   Microbiology results:  10/19 Ucx: in process 10/19 Bcx: in process 10/16 MRSA PCR: positive  Thank you for allowing pharmacy to participate in this patient's care.  Reatha Harps, PharmD PGY1 Pharmacy Resident 07/23/2021  11:21 AM Check [email or web address] for unit specific pharmacy number

## 2021-07-23 NOTE — Evaluation (Signed)
Physical Therapy Re-Evaluation Patient Details Name: Jeffrey [name] MRN: [id number] DOB: 1976-09-07 Today's Date: 07/23/2021  History of Present Illness  Pt is a 45 y.o. male with class IV HF admitted 07/18/21 for pre-VAD optimization; has been relatively stable as outpatient with NYHA 3B symptoms. S/p RHC with swan placement 10/14. S/p HM3 LVAD implant on 10/17. PMH includes poorly controlled HTN, renal cell carcinoma s/p nephrectomy, CKD3, DM2, OSA, gout, morbid obesity, HF (EF<20%).   Clinical Impression  Pt now s/p LVAD placement on 10/17. Today's session focused on OOB mobility, pt requiring minA for standing mobility (+2 assist due to multiple lines); expect pt will require increased assist for seated/standing ADL tasks. Pt limited by pain, fatigue and decreased activity tolerance. Expect pt to progress well with mobility and LVAD management. Pt would benefit from continued acute PT services to maximize functional mobility and independence prior to d/c home.    Recommendations for follow up therapy are one component of a multi-disciplinary discharge planning process, led by the attending physician.  Recommendations may be updated based on patient status, additional functional criteria and insurance authorization.  Follow Up Recommendations Home health PT;Supervision for mobility/OOB    Equipment Recommendations   (TBD)    Recommendations for Other Services       Precautions / Restrictions Precautions Precautions: Fall;Sternal;Other (comment) Precaution Comments: LVAD; multiple lines including Swan Ganz, external pacer, chest tube Restrictions Weight Bearing Restrictions: Yes (sternal precautions)      Mobility  Bed Mobility Overal bed mobility: Needs Assistance Bed Mobility: Supine to Sit     Supine to sit: Mod assist;+2 for safety/equipment     General bed mobility comments: ModA for trunk elevation as pt limited in ability to use arms (sternal prec, multiple lines)     Transfers Overall transfer level: Needs assistance Equipment used: None Transfers: Sit to/from Stand Sit to Stand: Min assist;+2 safety/equipment         General transfer comment: MinA for trunk elevation and stability (+2-3 helpful for safety w/ lines); pt with preference to hug lung pillow to stand  Ambulation/Gait Ambulation/Gait assistance: Min guard Gait Distance (Feet): 2 Feet Assistive device: None Gait Pattern/deviations: Step-to pattern;Trunk flexed Gait velocity: Decreased   General Gait Details: Pivotal steps to recliner with min guard (+2-3 lines); further distance deferred secondary to pain and fatigue  Stairs            Wheelchair Mobility    Modified Rankin (Stroke Patients Only)       Balance Overall balance assessment: Needs assistance Sitting-balance support: No upper extremity supported;Feet supported Sitting balance-Leahy Scale: Fair     Standing balance support: No upper extremity supported;During functional activity Standing balance-Leahy Scale: Fair                               Pertinent Vitals/Pain Pain Assessment: Faces Faces Pain Scale: Hurts whole lot Pain Location: Sternal incision/abdomen Pain Descriptors / Indicators: Discomfort;Guarding;Grimacing Pain Intervention(s): Monitored during session;Premedicated before session;Limited activity within patient's tolerance    Home Living Family/patient expects to be discharged to:: Private residence Living Arrangements: Spouse/significant other;Children Available Help at Discharge: Available 24 hours/day Type of Home: House Home Access: Stairs to enter;Ramped entrance Entrance Stairs-Rails: Right;Left;Can reach both Entrance Stairs-Number of Steps: 5; ramp in back Home Layout: One level Home Equipment: Tub bench;Walker - 2 wheels;Cane - single point Additional Comments: Wife taking leave for VAD recovery    Prior Function Level of  Independence: Independent          Comments: was using RW until last week when he was independent for mobility, independent for ADLs     Hand Dominance   Dominant Hand: Right    Extremity/Trunk Assessment   Upper Extremity Assessment Upper Extremity Assessment: Generalized weakness (limited secondary to pain and sternal precautions, pt endorses arms feeling "heavy")    Lower Extremity Assessment Lower Extremity Assessment: Generalized weakness       Communication   Communication: No difficulties  Cognition Arousal/Alertness: Awake/alert Behavior During Therapy: WFL for tasks assessed/performed;Flat affect Overall Cognitive Status: Within Functional Limits for tasks assessed                                 General Comments: Suspect flat affect related to fatigue, still agreeable and cognition appropriate      General Comments General comments (skin integrity, edema, etc.): CPAP removed for session, performed on 5L O2 [place]    Exercises     Assessment/Plan    PT Assessment Patient needs continued PT services  PT Problem List Cardiopulmonary status limiting activity;Decreased activity tolerance;Decreased balance;Decreased knowledge of use of DME;Pain;Decreased strength;Decreased mobility       PT Treatment Interventions DME instruction;Gait training;Functional mobility training;Therapeutic activities;Therapeutic exercise;Balance training;Patient/family education;Stair training    PT Goals (Current goals can be found in the Care Plan section)  Acute Rehab PT Goals Patient Stated Goal: Go home and hug my kids PT Goal Formulation: With patient Time For Goal Achievement: 08/06/21 Potential to Achieve Goals: Good    Frequency Min 3X/week   Barriers to discharge        Co-evaluation               AM-PAC PT "6 Clicks" Mobility  Outcome Measure Help needed turning from your back to your side while in a flat bed without using bedrails?: A Little Help needed moving from lying on  your back to sitting on the side of a flat bed without using bedrails?: A Lot Help needed moving to and from a bed to a chair (including a wheelchair)?: A Little Help needed standing up from a chair using your arms (e.g., wheelchair or bedside chair)?: A Little Help needed to walk in hospital room?: A Little Help needed climbing 3-5 steps with a railing? : A Lot 6 Click Score: 16    End of Session Equipment Utilized During Treatment: Oxygen Activity Tolerance: Patient tolerated treatment well;Patient limited by fatigue;Patient limited by pain Patient left: in chair;with call Abid/phone within reach;with nursing/sitter in room Nurse Communication: Mobility status PT Visit Diagnosis: Other abnormalities of gait and mobility (R26.89);Difficulty in walking, not elsewhere classified (R26.2)    Time: 1093-2355 PT Time Calculation (min) (ACUTE ONLY): 33 min   Charges:   PT Evaluation $PT Re-evaluation: [place], PT, DPT Acute Rehabilitation Services  Pager [phone] Office [place] 07/23/2021, 11:11 AM

## 2021-07-23 NOTE — Progress Notes (Addendum)
Advanced Heart Failure VAD Team Note  PCP-Cardiologist: Glori Bickers, MD   Subjective:    10/17: HM3 LVAD implant (DT)  10/18: Extubated    POD#2. Off NE and NO. Remains on Epi 2, Milrinone 0.25 and Amio gtt at 30.    Co-ox 68%. CI 2.8   2L in UOP yesterday. CVP 12  SCr trending up, 1.42>>1.55>>1.63. MAPs 80s    CT Output  560 cc. Hgb stable at 11.2   WBC 14>>19K, Temp 100.  CXR w/ patch atelectasis/infiltrate LLL   Swan #s  CVP 12 PAP 54/33 (41) CI 2.78 CO 6.58 PAPi 1.83   MAP 84   LVAD INTERROGATION:  HeartMate III LVAD:   Flow 4.3 liters/min, speed 5650, power 4.1, PI 2.9. 2 PI events   Objective:    Vital Signs:   Temp:  [99.5 F (37.5 C)-100.8 F (38.2 C)] 100 F (37.8 C) (10/19 1610) Pulse Rate:  [65-200] 89 (10/19 0652) Resp:  [6-41] 28 (10/19 0652) BP: (91-134)/(80-115) 134/115 (10/18 1200) SpO2:  [70 %-99 %] 94 % (10/19 9604) Arterial Line BP: (79-126)/(68-90) 126/76 (10/19 0652) FiO2 (%):  [40 %] 40 % (10/18 0748) Weight:  [134.4 kg] 134.4 kg (10/19 0652) Last BM Date: 07/18/21 Mean arterial Pressure 84   Intake/Output:   Intake/Output Summary (Last 24 hours) at 07/23/2021 0737 Last data filed at 07/23/2021 0700 Gross per 24 hour  Intake 2912.42 ml  Output 2665 ml  Net 247.42 ml     Physical Exam    CVP 12 General: well appearing, no distress  HEENT: normal  Neck: supple.  JVD 12 cm. + Lt IJ Swan. Carotids 2+ bilat; no bruits. No lymphadenopathy or thyromegaly appreciated. Cor: Mechanical heart sounds with LVAD hum present. + sternal dressing + CT + EPW Lungs: clear bilaterally  Abdomen: soft, nontender, nondistended. No hepatosplenomegaly. No bruits or masses. Good bowel sounds. Driveline: C/D/I; securement device intact and driveline incorporated Extremities: no cyanosis, clubbing, rash, trace bilateral LEE edema + SCDs  Neuro: awake, A&Ox3, moves all 4 extremities w/o difficulty, affect pleasant    Telemetry   NSR 80s, 4  beat run of NSVT x 1, personally reviewed   EKG    No new EKG to review   Labs   Basic Metabolic Panel: Recent Labs  Lab 07/21/21 0408 07/21/21 0823 07/21/21 1225 07/21/21 1324 07/21/21 1337 07/21/21 1815 07/21/21 1824 07/22/21 0024 07/22/21 0025 07/22/21 0306 07/22/21 1205 07/22/21 1218 07/22/21 1651 07/22/21 1700 07/23/21 0424  NA 132*   < > 141 139   < >  --    < >  --    < > 136 142 139 135 138 131*  K 4.3   < > 3.6 3.6   < >  --    < >  --    < > 4.7 3.5 4.2 4.4 4.5 4.9  CL 100   < > 104 106  --   --   --   --   --  106  --   --  105  --  101  CO2 24  --   --  24  --   --   --   --   --  21*  --   --  22  --  22  GLUCOSE 209*   < > 194* 190*  --   --   --   --   --  119*  --   --  135*  --  207*  BUN 26*   < > 25* 23*  --   --   --   --   --  20  --   --  18  --  20  CREATININE 1.60*   < > 1.50* 1.73*  --  1.59*  --   --   --  1.42*  --   --  1.55*  --  1.63*  CALCIUM 9.8  --   --  9.1  --   --   --   --   --  9.8  --   --  10.0  --  9.9  MG  --   --   --  2.0  --  3.0*  --  2.4  --  2.4  --   --  1.9  --  2.2  PHOS  --   --   --   --   --   --   --   --   --  3.5  --   --   --   --  4.7*   < > = values in this interval not displayed.    Liver Function Tests: Recent Labs  Lab 07/20/21 1107 07/22/21 0306 07/23/21 0424  AST 20 60* 45*  ALT 42 29 19  ALKPHOS 69 56 74  BILITOT 0.7 0.8 1.1  PROT 7.4 6.4* 6.5  ALBUMIN 3.4* 2.9* 2.5*   No results for input(s): LIPASE, AMYLASE in the last 168 hours. No results for input(s): AMMONIA in the last 168 hours.  CBC: Recent Labs  Lab 07/21/21 1815 07/21/21 1824 07/22/21 0024 07/22/21 0025 07/22/21 0306 07/22/21 1205 07/22/21 1218 07/22/21 1651 07/22/21 1700 07/23/21 0424  WBC 10.2  --  10.1  --  10.4  --   --  14.4*  --  19.0*  NEUTROABS  --   --   --   --  8.5*  --   --   --   --  15.6*  HGB 11.5*   < > 11.3*   < > 11.2* 11.2* 12.2* 11.7* 13.3 11.2*  HCT 36.3*   < > 35.6*   < > 35.8* 33.0* 36.0* 37.5*  39.0 35.7*  MCV 77.9*  --  77.6*  --  78.5*  --   --  78.3*  --  78.1*  PLT 249  --  240  --  230  --   --  223  --  228   < > = values in this interval not displayed.    INR: Recent Labs  Lab 07/20/21 0500 07/21/21 1408 07/22/21 0306 07/23/21 0614  INR 1.2 1.3* 1.3* 1.9*    Other results: EKG:    Imaging   DG Chest Port 1 View  Result Date: 07/22/2021 CLINICAL DATA:  Ventilator support.  Left ventricular assist device. EXAM: PORTABLE CHEST 1 VIEW COMPARISON:  07/21/2021 FINDINGS: Endotracheal tube tip 6 cm above the carina. Orogastric or nasogastric tube enters the abdomen. Swan-Ganz catheter tip in the right main pulmonary artery. Left ventricular assist device in place. Aortic balloon pump in place. Left chest tube in place. No pneumothorax. Enlarged cardiac silhouette appears unchanged. There may be minimally less edema than was seen yesterday. Mild bibasilar volume loss persists. IMPRESSION: Lines, tubes and support devices appear unchanged. No pneumothorax. Question slightly less edema. Electronically Signed   By: Nelson Chimes M.D.   On: 07/22/2021 08:37   DG Chest Port 1 View  Result Date: 07/21/2021 CLINICAL DATA:  LVAD EXAM:  PORTABLE CHEST 1 VIEW COMPARISON:  Radiograph 07/20/2021 FINDINGS: Postsurgical changes of median sternotomy and LVAD. There is a pulmonary artery catheter overlying the right pulmonary artery. There is a left sided chest tube in place. Endotracheal tube tip overlies the midthoracic trachea. There is a nasogastric tube with side port overlying the stomach. There is a mediastinal drain overlying the lower chest. There is a subcutaneous AICD lead unchanged in position. Diffuse interstitial and confluent opacities. No large pleural effusion or visible pneumothorax. IMPRESSION: Postoperative chest with new LVAD and support lines and tubes as described above. Mild-to-moderate pulmonary edema. Electronically Signed   By: Maurine Simmering M.D.   On: 07/21/2021 14:00      Medications:     Scheduled Medications:  acetaminophen  1,000 mg Oral Q6H   Or   acetaminophen (TYLENOL) oral liquid 160 mg/5 mL  1,000 mg Per Tube Q6H   bisacodyl  10 mg Oral Daily   Or   bisacodyl  10 mg Rectal Daily   chlorhexidine gluconate (MEDLINE KIT)  15 mL Mouth Rinse BID   Chlorhexidine Gluconate Cloth  6 each Topical Daily   docusate sodium  200 mg Oral Daily   insulin aspart  0-20 Units Subcutaneous Q4H   insulin detemir  40 Units Subcutaneous BID   mouth rinse  15 mL Mouth Rinse BID   mupirocin ointment  1 application Nasal BID   pantoprazole  40 mg Oral Daily   sertraline  25 mg Oral Daily   sodium chloride flush  3 mL Intravenous Q12H   traZODone  100 mg Per Tube QHS   Warfarin - Physician Dosing Inpatient   Does not apply q1600    Infusions:  sodium chloride Stopped (07/22/21 2203)   sodium chloride 20 mL/hr at 07/21/21 1423   albumin human Stopped (07/21/21 1516)   And   sodium chloride     amiodarone 30 mg/hr (07/23/21 0700)   epinephrine 2 mcg/min (07/23/21 0700)   lactated ringers     lactated ringers 20 mL/hr at 07/23/21 0700   milrinone 0.25 mcg/kg/min (07/23/21 0700)   norepinephrine (LEVOPHED) Adult infusion Stopped (07/22/21 1719)    PRN Medications: sodium chloride, albumin human **AND** sodium chloride, dextrose, morphine injection, ondansetron (ZOFRAN) IV, oxyCODONE, sodium chloride flush, traMADol   Assessment/Plan:    1. Acute on Chronic systolic CHF - Nonischemic CM, end-stage - Echo (9/21): EF 20-25%.   - Echo  8/22: EF < 20%, severe LV dilation, moderate RV dysfunction.   - RHC (8/22): well compensated hemodynamics. - s/p S-ICD - NYHA III-IIIB  - Body mass index is 45.42 kg/m. Not transplant candidate - RHC 10/14 with elevated filling pressures and low CO. Started milrinone 0.25 for optimization prior to VAD - S/p HM3 LVAD 10/17 (DT) - On milrinone 0.25, + Epi 2. Hemodynamics good/ Vad parameters stable  - Co-ox 68%. CI  2.8, CVP 12  - C/w gentle diuresis, monitor renal fx - Continue current gtts, wean as tolerated per CT surgery     2 Atrial fibrillation: Paroxysmal - Emergent TEE-guided DCCV 7/27 due to inability to control HR. - Recurrent AF on 8/5. s/p DC-CV on 05/10/21.  - Seen by EP. Not a candidate for ablation due to body habitus  (could consider AVN ablation and CRT, if needed)  - In NSR today - Continue IV amio to prevent recurrent VT while on inotropes  - warfarin per CT surgery    3. CKD stage 3B  - Has solitary kidney.   -  Baseline creatinine is 1.6-2.9.  - Avoid hypotension. - SCr 1.63 today, monitor closely    4. OSA - Continue CPAP   5. DM2. Poorly controlled - On Jardiance. - Hgb A1c 9.4. - Cover with SSI   6. Hematuria - MRI and cystoscopy ok   7. Leukocytosis/Fever - WBC 14>>19K, Temp 100  - ? PNA based on CXR, LLL atelectasis vs infiltrate  - Pan culture + broad spectrum abx    I reviewed the LVAD parameters from today, and compared the results to the patient's prior recorded data.  No programming changes were made.  The LVAD is functioning within specified parameters.  The patient performs LVAD self-test daily.  LVAD interrogation was negative for any significant power changes, alarms or PI events/speed drops.  LVAD equipment check completed and is in good working order.  Back-up equipment present.   LVAD education done on emergency procedures and precautions and reviewed exit site care.  Length of Stay: [address] [place] 07/23/2021, 7:37 AM  VAD Team --- VAD ISSUES ONLY--- Pager [phone] (7am - 7am)  Advanced Heart Failure Team  Pager [phone] (M-F; 7a - 5p)  Please contact [facility] for night-coverage after hours (5p -7a ) and weekends on [email or web address]   Agree with above.  Extubated. Remains on Epi 2 and milrinone. Co-ox ok. Diuresed relatively well but CVP remains elevated. Low-grade fever overnight. Cx drawn. Now sitting up in chair.   General:   Sitting in chair sore HEENT: normal  Neck: supple. LIJ swan.  Carotids 2+ bilat; no bruits. No lymphadenopathy or thryomegaly appreciated. Cor: sternal dressing ok  LVAD hum.  CTs dry  Lungs: Clear. Abdomen: obese soft, nontender, non-distended.hypoactive bowel sounds Driveline site clean. Anchor in place.  Extremities: no cyanosis, clubbing, rash. Warm 1+ edema  Neuro: alert & oriented x 3. No focal deficits. Moves all 4 without problem   Will stop EPI. Continue milrinone. Lasix 40 IV bid today. VAD parameters ok. I turned speed up to 5800 (likely can go higher). Continue to mobilize. Watch fever curve. INR jumped with warfarin. Hold dosing today.   CRITICAL CARE Performed by: Glori Bickers  Total critical care time: 40 minutes  Critical care time was exclusive of separately billable procedures and treating other patients.  Critical care was necessary to treat or prevent imminent or life-threatening deterioration.  Critical care was time spent personally by me (independent of midlevel providers or residents) on the following activities: development of treatment plan with patient and/or surrogate as well as nursing, discussions with consultants, evaluation of patient's response to treatment, examination of patient, obtaining history from patient or surrogate, ordering and performing treatments and interventions, ordering and review of laboratory studies, ordering and review of radiographic studies, pulse oximetry and re-evaluation of patient's condition.  Glori Bickers, MD  1:28 PM

## 2021-07-23 NOTE — Evaluation (Signed)
Occupational Therapy Evaluation Patient Details Name: Jeffrey [name] MRN: [id number] DOB: [birth year] Today's Date: 07/23/2021   History of Present Illness Pt is a 45 y.o. male with class IV HF admitted 07/18/21 for pre-VAD optimization; has been relatively stable as outpatient with NYHA 3B symptoms. S/p RHC with swan placement 10/14. S/p HM3 LVAD implant on 10/17. PMH includes poorly controlled HTN, renal cell carcinoma s/p nephrectomy, CKD3, DM2, OSA, gout, morbid obesity, HF (EF<20%).   Clinical Impression   Pt reports having returned to independence prior to this admission for planned LVAD. He presents with generalized weakness and pain, but was able to transfer bed to chair with min guard assist, +2 for multiple lines. Began educating pt in sternal precautions and to keep controller around his neck during mobility. Pt likely to progress well and not require post acute OT. Will follow acutely.      Recommendations for follow up therapy are one component of a multi-disciplinary discharge planning process, led by the attending physician.  Recommendations may be updated based on patient status, additional functional criteria and insurance authorization.   Follow Up Recommendations  No OT follow up    Equipment Recommendations  3 in 1 bedside commode (bariatric)    Recommendations for Other Services       Precautions / Restrictions Precautions Precautions: Fall;Sternal;Other (comment) Precaution Comments: LVAD; multiple lines including Swan Ganz, external pacer, chest tube Restrictions Weight Bearing Restrictions: Yes (sternal precautions)      Mobility Bed Mobility Overal bed mobility: Needs Assistance Bed Mobility: Supine to Sit     Supine to sit: Min assist;+2 for physical assistance     General bed mobility comments: increased time, min assist to raise trunk    Transfers Overall transfer level: Needs assistance Equipment used: 1 person hand held assist Transfers: Sit  to/from [facility] Sit to Stand: Min guard;+2 safety/equipment Stand pivot transfers: Min guard;+2 safety/equipment       General transfer comment: pt held pillow, wide BOS to stand using momentum, increased time, pt held pillow to chest    Balance Overall balance assessment: Needs assistance Sitting-balance support: No upper extremity supported;Feet supported Sitting balance-Leahy Scale: Fair     Standing balance support: No upper extremity supported;During functional activity Standing balance-Leahy Scale: Fair Standing balance comment: min guard assist                           ADL either performed or assessed with clinical judgement   ADL Overall ADL's : Needs assistance/impaired Eating/Feeding: Set up;Sitting   Grooming: Set up;Sitting   Upper Body Bathing: Moderate assistance;Sitting   Lower Body Bathing: Total assistance;Sit to/from stand;+2 for safety/equipment   Upper Body Dressing : Maximal assistance;Bed level   Lower Body Dressing: Total assistance;Bed level   Toilet Transfer: Min guard;+2 for safety/equipment Toilet Transfer Details (indicate cue type and reason): simulated bed to chair Toileting- Clothing Manipulation and Hygiene: Total assistance;Sit to/from stand;+2 for safety/equipment         General ADL Comments: Pt limited by pain and multiple lines, but agreed to transfer bed to chair.     Vision Ability to See in Adequate Light: 0 Adequate       Perception     Praxis      Pertinent Vitals/Pain Pain Assessment: Faces Faces Pain Scale: Hurts whole lot Pain Location: Sternal incision/abdomen Pain Descriptors / Indicators: Discomfort;Guarding;Grimacing Pain Intervention(s): Monitored during session;Premedicated before session;Repositioned     Hand Dominance  Right   Extremity/Trunk Assessment Upper Extremity Assessment Upper Extremity Assessment: Generalized weakness   Lower Extremity Assessment Lower  Extremity Assessment: Defer to PT evaluation   Cervical / Trunk Assessment Cervical / Trunk Assessment: Other exceptions Cervical / Trunk Exceptions: increased body habitus   Communication Communication Communication: No difficulties   Cognition Arousal/Alertness: Awake/alert Behavior During Therapy: Flat affect Overall Cognitive Status: Within Functional Limits for tasks assessed                                 General Comments: Suspect flat affect related to fatigue, still agreeable and cognition appropriate   General Comments  CPAP removed for session, performed on 5L O2 [place]    Exercises     Shoulder Instructions      Home Living Family/patient expects to be discharged to:: Private residence Living Arrangements: Spouse/significant other;Children Available Help at Discharge: Available 24 hours/day Type of Home: House Home Access: Stairs to enter;Ramped entrance Entrance Stairs-Number of Steps: 5; ramp in back Entrance Stairs-Rails: Right;Left;Can reach both Home Layout: One level     Bathroom Shower/Tub: Occupational psychologist: Standard Bathroom Accessibility: Yes   Home Equipment: Tub bench;Walker - 2 wheels;Cane - single point   Additional Comments: Wife taking leave for VAD recovery      Prior Functioning/Environment Level of Independence: Independent        Comments: was using RW until last week when he was independent for mobility, independent for ADLs        OT Problem List: Decreased strength;Decreased activity tolerance;Impaired balance (sitting and/or standing);Decreased knowledge of use of DME or AE;Pain      OT Treatment/Interventions: Self-care/ADL training;Energy conservation;DME and/or AE instruction;Therapeutic activities;Patient/family education;Balance training    OT Goals(Current goals can be found in the care plan section) Acute Rehab OT Goals Patient Stated Goal: Go home and hug my kids OT Goal Formulation: With  patient Time For Goal Achievement: 08/06/21 Potential to Achieve Goals: Good  OT Frequency: Min 2X/week   Barriers to D/C:            Co-evaluation PT/OT/SLP Co-Evaluation/Treatment: Yes Reason for Co-Treatment: Complexity of the patient's impairments (multi-system involvement)   OT goals addressed during session: Strengthening/ROM      AM-PAC OT "6 Clicks" Daily Activity     Outcome Measure Help from another person eating meals?: A Little Help from another person taking care of personal grooming?: A Little Help from another person toileting, which includes using toliet, bedpan, or urinal?: Total Help from another person bathing (including washing, rinsing, drying)?: A Lot Help from another person to put on and taking off regular upper body clothing?: A Lot Help from another person to put on and taking off regular lower body clothing?: Total 6 Click Score: 12   End of Session Equipment Utilized During Treatment: Oxygen Nurse Communication: Mobility status  Activity Tolerance: Patient limited by pain Patient left: in chair;with call Mauzy/phone within reach;with nursing/sitter in room (MD in room)  OT Visit Diagnosis: Unsteadiness on feet (R26.81);Other abnormalities of gait and mobility (R26.89);Pain;Muscle weakness (generalized) (M62.81)                Time: 6203-5597 OT Time Calculation (min): 33 min Charges:  OT General Charges $OT Visit: 1 Visit OT Evaluation $OT Eval High Complexity: 1 High  Nestor Lewandowsky, OTR/L Acute Rehabilitation Services Pager: [phone] Office: [phone]  Malka So 07/23/2021, 11:49 AM

## 2021-07-23 NOTE — Progress Notes (Signed)
LVAD Coordinator Rounding Note:  Admitted 07/18/21 due to Dr. [name] for elective VAD placement.   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 Cyndia Bent under Destination Therapy criteria due to BMI and chronic kidney disease.   Pt lying in bed this am dozing; wearing CPAP with 5L O2. Pt arouses to verbal stimuli,  follows commands, answers questions, remains sleepy. BS nurse getting IV pain med prior to pulling chest tubes.   WBC count up with low grade fever, pan cultures ordered by Dr. [name].   Vital signs: Temp:  100.0 HR: 86 A-Line:  96/85 (89) O2 Sat: 95% on CPAP with 5 L O2 Wt:  277>192>285>196.3 lbs   LVAD interrogation reveals:  Speed: 5600 Flow:  4.1 Power:  4.0w PI:  4.0 Hct: 37  Alarms:  none Events:  none  Fixed speed:  5600 Low speed limit: 5300  Drive Line: Existing VAD dressing removed and site care performed using sterile technique. Drive line exit site cleaned with Chlora prep applicators x 2, allowed to dry, and gauze dressing with silver strip applied. Exit site with one suture intact, the velour is fully implanted at exit site; moderate amount serous drainage with no redness, tenderness, or foul odor noted. Drive line anchor re-applied. Will continue daily dressing changes per VAD Coordinator, Nurse Davonna Belling, or trained caregiver.   Labs:  LDH trend: 318>372  INR trend: 1.3>1.9  Anticoagulation Plan: -INR Goal: 2.0 - 2.5  -ASA Dose: none due to allergy  Blood Products:  - 07/21/21 2 units FFP (intra op)  Device: - [employer] single ICD -Therapies: off   Respiratory: Extubated 07/22/21  Nitric Oxide:  Off 07/22/21  Infection:  - sputum culture 07/23/21>>>pending - urine culture 07/23/21>>>pending - blood culture 07/23/21>>>oending  VAD Education: Unable to teach; IV pain medication; pt very sleepy; no family present.    Plan/Recommendations:  Call VAD Coordinator if any VAD equipment or drive line issues. Daily dressing  changes per VAD Coordinator, Nurse Davonna Belling, or trained caregiver.   Zada Girt RN, VAD Coordinator 24/7 VAD Pager: [phone]

## 2021-07-23 NOTE — Progress Notes (Signed)
CSW met at bedside with patient. He was sitting up in a chair and states it was painful getting out of bed. Patient trying to rest and shared that it seems easier sitting up in the chair. Patient asked CSW about a handicap placard for his car. CSW discussed improved quality of life with the LVAD and hopeful he would not need but can discuss further with the VAD Coordinators and need for placard post discharge. Patient agreeable to plan. CSW continues to follow for support throughout implant hospitalization. Raquel Sarna, [place], [place]

## 2021-07-24 ENCOUNTER — Inpatient Hospital Stay (HOSPITAL_COMMUNITY): Payer: Medicaid Other

## 2021-07-24 ENCOUNTER — Inpatient Hospital Stay: Payer: Self-pay

## 2021-07-24 DIAGNOSIS — E119 Type 2 diabetes mellitus without complications: Secondary | ICD-10-CM | POA: Diagnosis not present

## 2021-07-24 DIAGNOSIS — I5022 Chronic systolic (congestive) heart failure: Secondary | ICD-10-CM | POA: Diagnosis not present

## 2021-07-24 DIAGNOSIS — R0989 Other specified symptoms and signs involving the circulatory and respiratory systems: Secondary | ICD-10-CM | POA: Diagnosis not present

## 2021-07-24 DIAGNOSIS — N183 Chronic kidney disease, stage 3 unspecified: Secondary | ICD-10-CM | POA: Diagnosis not present

## 2021-07-24 DIAGNOSIS — I272 Pulmonary hypertension, unspecified: Secondary | ICD-10-CM | POA: Diagnosis not present

## 2021-07-24 DIAGNOSIS — I517 Cardiomegaly: Secondary | ICD-10-CM | POA: Diagnosis not present

## 2021-07-24 DIAGNOSIS — Z452 Encounter for adjustment and management of vascular access device: Secondary | ICD-10-CM | POA: Diagnosis not present

## 2021-07-24 DIAGNOSIS — I428 Other cardiomyopathies: Secondary | ICD-10-CM | POA: Diagnosis not present

## 2021-07-24 DIAGNOSIS — J811 Chronic pulmonary edema: Secondary | ICD-10-CM | POA: Diagnosis not present

## 2021-07-24 DIAGNOSIS — I5023 Acute on chronic systolic (congestive) heart failure: Secondary | ICD-10-CM | POA: Diagnosis not present

## 2021-07-24 DIAGNOSIS — R079 Chest pain, unspecified: Secondary | ICD-10-CM | POA: Diagnosis not present

## 2021-07-24 DIAGNOSIS — I480 Paroxysmal atrial fibrillation: Secondary | ICD-10-CM | POA: Diagnosis not present

## 2021-07-24 DIAGNOSIS — Z95811 Presence of heart assist device: Secondary | ICD-10-CM | POA: Diagnosis not present

## 2021-07-24 LAB — POCT I-STAT 7, (LYTES, BLD GAS, ICA,H+H)
Acid-base deficit: 1 mmol/L (ref 0.0–2.0)
Acid-base deficit: 1 mmol/L (ref 0.0–2.0)
Bicarbonate: 26.1 mmol/L (ref 20.0–28.0)
Bicarbonate: 25.8 mmol/L (ref 20.0–28.0)
Calcium, Ion: 1.57 mmol/L (ref 1.15–1.40)
Calcium, Ion: 1.56 mmol/L (ref 1.15–1.40)
HCT: 35 % — ABNORMAL LOW (ref 39.0–52.0)
HCT: 35 % — ABNORMAL LOW (ref 39.0–52.0)
Hemoglobin: 11.9 g/dL — ABNORMAL LOW (ref 13.0–17.0)
Hemoglobin: 11.9 g/dL — ABNORMAL LOW (ref 13.0–17.0)
O2 Saturation: 83 %
O2 Saturation: 81 %
Patient temperature: 37.1
Patient temperature: 37.1
Potassium: 4.5 mmol/L (ref 3.5–5.1)
Potassium: 4.5 mmol/L (ref 3.5–5.1)
Sodium: 134 mmol/L — ABNORMAL LOW (ref 135–145)
Sodium: 135 mmol/L (ref 135–145)
TCO2: 28 mmol/L (ref 22–32)
TCO2: 27 mmol/L (ref 22–32)
pCO2 arterial: 52.3 mmHg — ABNORMAL HIGH (ref 32.0–48.0)
pCO2 arterial: 53.4 mmHg — ABNORMAL HIGH (ref 32.0–48.0)
pH, Arterial: 7.307 — ABNORMAL LOW (ref 7.350–7.450)
pH, Arterial: 7.293 — ABNORMAL LOW (ref 7.350–7.450)
pO2, Arterial: 54 mmHg — ABNORMAL LOW (ref 83.0–108.0)
pO2, Arterial: 51 mmHg — ABNORMAL LOW (ref 83.0–108.0)

## 2021-07-24 LAB — COMPREHENSIVE METABOLIC PANEL
ALT: 13 U/L (ref 0–44)
AST: 26 U/L (ref 15–41)
Albumin: 2.5 g/dL — ABNORMAL LOW (ref 3.5–5.0)
Alkaline Phosphatase: 72 U/L (ref 38–126)
Anion gap: 8 (ref 5–15)
BUN: 30 mg/dL — ABNORMAL HIGH (ref 6–20)
CO2: 24 mmol/L (ref 22–32)
Calcium: 10.6 mg/dL — ABNORMAL HIGH (ref 8.9–10.3)
Chloride: 101 mmol/L (ref 98–111)
Creatinine, Ser: 1.66 mg/dL — ABNORMAL HIGH (ref 0.61–1.24)
GFR, Estimated: 51 mL/min — ABNORMAL LOW (ref >60)
Glucose, Bld: 134 mg/dL — ABNORMAL HIGH (ref 70–99)
Potassium: 4.4 mmol/L (ref 3.5–5.1)
Sodium: 133 mmol/L — ABNORMAL LOW (ref 135–145)
Total Bilirubin: 0.7 mg/dL (ref 0.3–1.2)
Total Protein: 6.5 g/dL (ref 6.5–8.1)

## 2021-07-24 LAB — CBC WITH DIFFERENTIAL/PLATELET
Abs Immature Granulocytes: 0.14 10*3/uL — ABNORMAL HIGH (ref 0.00–0.07)
Basophils Absolute: 0.1 10*3/uL (ref 0.0–0.1)
Basophils Relative: 0 %
Eosinophils Absolute: 0.1 10*3/uL (ref 0.0–0.5)
Eosinophils Relative: 1 %
HCT: 34.3 % — ABNORMAL LOW (ref 39.0–52.0)
Hemoglobin: 10.6 g/dL — ABNORMAL LOW (ref 13.0–17.0)
Immature Granulocytes: 1 %
Lymphocytes Relative: 9 %
Lymphs Abs: 1.7 10*3/uL (ref 0.7–4.0)
MCH: 24.5 pg — ABNORMAL LOW (ref 26.0–34.0)
MCHC: 30.9 g/dL (ref 30.0–36.0)
MCV: 79.2 fL — ABNORMAL LOW (ref 80.0–100.0)
Monocytes Absolute: 2.2 10*3/uL — ABNORMAL HIGH (ref 0.1–1.0)
Monocytes Relative: 12 %
Neutro Abs: 15.2 10*3/uL — ABNORMAL HIGH (ref 1.7–7.7)
Neutrophils Relative %: 77 %
Platelets: 225 10*3/uL (ref 150–400)
RBC: 4.33 MIL/uL (ref 4.22–5.81)
RDW: 15.9 % — ABNORMAL HIGH (ref 11.5–15.5)
WBC: 19.4 10*3/uL — ABNORMAL HIGH (ref 4.0–10.5)
nRBC: 0 % (ref 0.0–0.2)

## 2021-07-24 LAB — TYPE AND SCREEN
ABO/RH(D): O POS
Antibody Screen: NEGATIVE
Unit division: 0
Unit division: 0

## 2021-07-24 LAB — URINE CULTURE: Culture: NO GROWTH

## 2021-07-24 LAB — PROTIME-INR
INR: 1.9 — ABNORMAL HIGH (ref 0.8–1.2)
Prothrombin Time: 21.7 seconds — ABNORMAL HIGH (ref 11.4–15.2)

## 2021-07-24 LAB — GLUCOSE, CAPILLARY
Glucose-Capillary: 108 mg/dL — ABNORMAL HIGH (ref 70–99)
Glucose-Capillary: 132 mg/dL — ABNORMAL HIGH (ref 70–99)
Glucose-Capillary: 133 mg/dL — ABNORMAL HIGH (ref 70–99)
Glucose-Capillary: 146 mg/dL — ABNORMAL HIGH (ref 70–99)
Glucose-Capillary: 179 mg/dL — ABNORMAL HIGH (ref 70–99)
Glucose-Capillary: 180 mg/dL — ABNORMAL HIGH (ref 70–99)

## 2021-07-24 LAB — BPAM RBC
Blood Product Expiration Date: 202211102359
Blood Product Expiration Date: 202211122359
Unit Type and Rh: 5100
Unit Type and Rh: 5100

## 2021-07-24 LAB — LACTATE DEHYDROGENASE: LDH: 312 U/L — ABNORMAL HIGH (ref 98–192)

## 2021-07-24 LAB — COOXEMETRY PANEL
Carboxyhemoglobin: 1.2 % (ref 0.5–1.5)
Carboxyhemoglobin: 1.3 % (ref 0.5–1.5)
Methemoglobin: 0.8 % (ref 0.0–1.5)
Methemoglobin: 0.8 % (ref 0.0–1.5)
O2 Saturation: 47.2 %
O2 Saturation: 65.5 %
Total hemoglobin: 11.5 g/dL — ABNORMAL LOW (ref 12.0–16.0)
Total hemoglobin: 11.5 g/dL — ABNORMAL LOW (ref 12.0–16.0)

## 2021-07-24 LAB — PHOSPHORUS: Phosphorus: 5 mg/dL — ABNORMAL HIGH (ref 2.5–4.6)

## 2021-07-24 LAB — MAGNESIUM: Magnesium: 2.3 mg/dL (ref 1.7–2.4)

## 2021-07-24 MED ORDER — SODIUM CHLORIDE 0.9% FLUSH
10.0000 mL | Freq: Two times a day (BID) | INTRAVENOUS | Status: DC
  Administered 2021-07-24 – 2021-07-26 (×4): 10 mL
  Administered 2021-07-27: 30 mL
  Administered 2021-07-28 – 2021-07-30 (×3): 10 mL
  Administered 2021-07-31: 20 mL
  Administered 2021-07-31 – 2021-08-01 (×2): 10 mL
  Administered 2021-08-02: 20 mL
  Administered 2021-08-03: 10 mL
  Administered 2021-08-03: 20 mL
  Administered 2021-08-04: 30 mL
  Administered 2021-08-04 – 2021-08-07 (×6): 10 mL

## 2021-07-24 MED ORDER — AMIODARONE LOAD VIA INFUSION
150.0000 mg | Freq: Once | INTRAVENOUS | Status: AC
  Administered 2021-07-24: 150 mg via INTRAVENOUS
  Filled 2021-07-24: qty 83.34

## 2021-07-24 MED ORDER — INSULIN DETEMIR 100 UNIT/ML ~~LOC~~ SOLN
30.0000 [IU] | Freq: Two times a day (BID) | SUBCUTANEOUS | Status: DC
  Administered 2021-07-24 – 2021-08-03 (×22): 30 [IU] via SUBCUTANEOUS
  Filled 2021-07-24 (×25): qty 0.3

## 2021-07-24 MED ORDER — WARFARIN SODIUM 2.5 MG PO TABS
2.5000 mg | ORAL_TABLET | Freq: Every day | ORAL | Status: DC
  Administered 2021-07-24 – 2021-07-25 (×2): 2.5 mg via ORAL
  Filled 2021-07-24 (×2): qty 1

## 2021-07-24 MED ORDER — SORBITOL 70 % SOLN
30.0000 mL | Freq: Once | Status: AC
  Administered 2021-07-24: 30 mL via ORAL
  Filled 2021-07-24: qty 30

## 2021-07-24 MED ORDER — AMIODARONE IV BOLUS ONLY 150 MG/100ML: 150.0000 mg | Freq: Once | INTRAVENOUS | Status: DC

## 2021-07-24 MED ORDER — FUROSEMIDE 10 MG/ML IJ SOLN
10.0000 mg/h | INTRAVENOUS | Status: DC
  Administered 2021-07-24 – 2021-07-25 (×2): 8 mg/h via INTRAVENOUS
  Administered 2021-07-25 – 2021-07-26 (×3): 15 mg/h via INTRAVENOUS
  Administered 2021-07-27: 20 mg/h via INTRAVENOUS
  Administered 2021-07-27: 15 mg/h via INTRAVENOUS
  Administered 2021-07-27 – 2021-07-30 (×7): 20 mg/h via INTRAVENOUS
  Filled 2021-07-24 (×16): qty 20

## 2021-07-24 MED ORDER — POLYETHYLENE GLYCOL 3350 17 G PO PACK
17.0000 g | PACK | Freq: Two times a day (BID) | ORAL | Status: DC
  Administered 2021-07-24 – 2021-08-01 (×14): 17 g via ORAL
  Filled 2021-07-24 (×20): qty 1

## 2021-07-24 MED ORDER — FUROSEMIDE 10 MG/ML IJ SOLN
40.0000 mg | Freq: Once | INTRAMUSCULAR | Status: AC
  Administered 2021-07-24: 40 mg via INTRAVENOUS
  Filled 2021-07-24: qty 4

## 2021-07-24 MED ORDER — SODIUM CHLORIDE 0.9% FLUSH: 10.0000 mL | INTRAVENOUS | Status: DC | PRN

## 2021-07-24 MED ORDER — FUROSEMIDE 10 MG/ML IJ SOLN
40.0000 mg | Freq: Once | INTRAMUSCULAR | Status: DC
Start: 2021-07-24 — End: 2021-07-26
  Filled 2021-07-24 (×3): qty 4

## 2021-07-24 MED FILL — Heparin Sodium (Porcine) Inj 1000 Unit/ML: Qty: 1000 | Status: AC

## 2021-07-24 MED FILL — Potassium Chloride Inj 2 mEq/ML: INTRAVENOUS | Qty: 40 | Status: AC

## 2021-07-24 MED FILL — Magnesium Sulfate Inj 50%: INTRAMUSCULAR | Qty: 10 | Status: AC

## 2021-07-24 NOTE — Progress Notes (Addendum)
Advanced Heart Failure VAD Team Note  PCP-Cardiologist: Glori Bickers, MD   Subjective:    10/17: HM3 LVAD implant (DT)  10/18: Extubated  10/20: fever spike 100, WBC 20K. Started on vanc + meropenum   POD# 4. Overnight, developed hypoxemia with sats 88% on monitor and ABG 7.29, CO2 53, O2 51 81% on ABG with Co-ox 47.2. CXR w/ worsening interstitial edema. Placed on BiPAP w/ improved sats 98%. Repeat Co-ox also improved to 66% today.   Off NE and Epi. Remains on Milrinone 0.25 and Amio gtt at 30.   Only 1.2 L in UOP yesterday. Wt up overall ~28 lb. CVP 16.  SCr 1.66 c/w baseline.   CT Output slowing 300 cc out yesterday.  Hgb trending up, 11.9. INR 1.9   Fever curve trending down, temp 99. WBC 19K (stable).  UA negative, Ucx NGTD. Blood cultures pending.  CXR no PNA.     Swan #s  CVP 16 PAP 47/26 (34) CI 2.90 CO 6.87 PAPi 1.31  OOB sitting up in chair. On BiPAP. Has some post op pain. Otherwise doing ok.    LVAD INTERROGATION:  HeartMate III LVAD:   Flow 4.6 liters/min, speed 5800, power 4.4, PI 3.6. 1 PI events  Objective:    Vital Signs:   Temp:  [98.4 F (36.9 C)-100.2 F (37.9 C)] 99 F (37.2 C) (10/20 0600) Pulse Rate:  [84-192] 92 (10/20 0600) Resp:  [14-37] 23 (10/20 0600) BP: (83-114)/(74-84) 83/75 (10/20 0400) SpO2:  [79 %-98 %] 98 % (10/20 0600) Arterial Line BP: (83-129)/(69-91) 92/82 (10/20 0600) Weight:  [138.6 kg] 138.6 kg (10/20 0500) Last BM Date: 07/18/21 Mean arterial Pressure 84   Intake/Output:   Intake/Output Summary (Last 24 hours) at 07/24/2021 0809 Last data filed at 07/24/2021 0700 Gross per 24 hour  Intake 3360.76 ml  Output 1455 ml  Net 1905.76 ml     Physical Exam    CVP 16  General: well appearing, sitting up in chair on BiPAP HEENT: normal  Neck: supple.  JVD elevated to jaw. + Lt IJ Swan. Carotids 2+ bilat; no bruits. No lymphadenopathy or thyromegaly appreciated. Cor: Mechanical heart sounds with LVAD hum  present. + sternal dressing + CT + EPW Lungs: decreased BS at the bases bilaterally  Abdomen: soft, nontender, nondistended. No hepatosplenomegaly. No bruits or masses. Good bowel sounds. Driveline: C/D/I; securement device intact and driveline incorporated Extremities: no cyanosis, clubbing, rash, trace bilateral LEE edema + SCDs  Neuro: awake, A&Ox3, moves all 4 extremities w/o difficulty, affect pleasant    Telemetry   NSR 80s, 16 beat run of NSVT x 1, personally reviewed   EKG    No new EKG to review   Labs   Basic Metabolic Panel: Recent Labs  Lab 07/21/21 1324 07/21/21 1337 07/21/21 1815 07/21/21 1824 07/22/21 0024 07/22/21 0025 07/22/21 0306 07/22/21 1205 07/22/21 1651 07/22/21 1700 07/23/21 0424 07/24/21 0350 07/24/21 0401 07/24/21 0449  NA 139   < >  --    < >  --    < > 136   < > 135 138 131* 133* 134* 135  K 3.6   < >  --    < >  --    < > 4.7   < > 4.4 4.5 4.9 4.4 4.5 4.5  CL 106  --   --   --   --   --  106  --  105  --  101 101  --   --  CO2 24  --   --   --   --   --  21*  --  22  --  22 24  --   --   GLUCOSE 190*  --   --   --   --   --  119*  --  135*  --  207* 134*  --   --   BUN 23*  --   --   --   --   --  20  --  18  --  20 30*  --   --   CREATININE 1.73*  --  1.59*  --   --   --  1.42*  --  1.55*  --  1.63* 1.66*  --   --   CALCIUM 9.1  --   --   --   --   --  9.8  --  10.0  --  9.9 10.6*  --   --   MG 2.0  --  3.0*  --  2.4  --  2.4  --  1.9  --  2.2 2.3  --   --   PHOS  --   --   --   --   --   --  3.5  --   --   --  4.7* 5.0*  --   --    < > = values in this interval not displayed.    Liver Function Tests: Recent Labs  Lab 07/20/21 1107 07/22/21 0306 07/23/21 0424 07/24/21 0350  AST 20 60* 45* 26  ALT 42 '29 19 13  ' ALKPHOS 69 56 74 72  BILITOT 0.7 0.8 1.1 0.7  PROT 7.4 6.4* 6.5 6.5  ALBUMIN 3.4* 2.9* 2.5* 2.5*   No results for input(s): LIPASE, AMYLASE in the last 168 hours. No results for input(s): AMMONIA in the last 168  hours.  CBC: Recent Labs  Lab 07/22/21 0024 07/22/21 0025 07/22/21 0306 07/22/21 1205 07/22/21 1651 07/22/21 1700 07/23/21 0424 07/24/21 0350 07/24/21 0401 07/24/21 0449  WBC 10.1  --  10.4  --  14.4*  --  19.0* 19.4*  --   --   NEUTROABS  --   --  8.5*  --   --   --  15.6* 15.2*  --   --   HGB 11.3*   < > 11.2*   < > 11.7* 13.3 11.2* 10.6* 11.9* 11.9*  HCT 35.6*   < > 35.8*   < > 37.5* 39.0 35.7* 34.3* 35.0* 35.0*  MCV 77.6*  --  78.5*  --  78.3*  --  78.1* 79.2*  --   --   PLT 240  --  230  --  223  --  228 225  --   --    < > = values in this interval not displayed.    INR: Recent Labs  Lab 07/20/21 0500 07/21/21 1408 07/22/21 0306 07/23/21 0614 07/24/21 0350  INR 1.2 1.3* 1.3* 1.9* 1.9*    Other results: EKG:    Imaging   DG CHEST PORT 1 VIEW  Result Date: 07/23/2021 CLINICAL DATA:  Reason for exam: LVAD present Patient reports center chest pains without sob. Hx of chf, htn, diabetes. EXAM: PORTABLE CHEST - 1 VIEW COMPARISON:  the previous day's study FINDINGS: Interval extubation and removal of gastric tube. Left IJ Swan-Ganz, left chest tube, and LVAD stable in position. No pneumothorax. Relatively low lung volumes with some patchy atelectasis or consolidation at the  left lung base. Blunting of lateral costophrenic angles suggesting effusions. Stable cardiomegaly.  Sternotomy wires. IMPRESSION: Extubation with residual patchy atelectasis/infiltrate at the left lung base. Electronically Signed   By: Lucrezia Europe M.D.   On: 07/23/2021 07:42     Medications:     Scheduled Medications:  acetaminophen  1,000 mg Oral Q6H   Or   acetaminophen (TYLENOL) oral liquid 160 mg/5 mL  1,000 mg Per Tube Q6H   bisacodyl  10 mg Oral Daily   Or   bisacodyl  10 mg Rectal Daily   chlorhexidine gluconate (MEDLINE KIT)  15 mL Mouth Rinse BID   Chlorhexidine Gluconate Cloth  6 each Topical Daily   docusate sodium  200 mg Oral Daily   furosemide  40 mg Intravenous Once    insulin aspart  0-20 Units Subcutaneous Q4H   insulin detemir  30 Units Subcutaneous BID   mouth rinse  15 mL Mouth Rinse BID   mupirocin ointment  1 application Nasal BID   pantoprazole  40 mg Oral Daily   sertraline  25 mg Oral Daily   sodium chloride flush  3 mL Intravenous Q12H   traZODone  100 mg Per Tube QHS   warfarin  2.5 mg Oral q1600   Warfarin - Physician Dosing Inpatient   Does not apply q1600    Infusions:  sodium chloride Stopped (07/22/21 2203)   sodium chloride 10 mL/hr at 07/24/21 0700   albumin human Stopped (07/21/21 1516)   And   sodium chloride     amiodarone 30 mg/hr (07/24/21 0700)   epinephrine Stopped (07/23/21 1015)   lactated ringers     lactated ringers 20 mL/hr at 07/24/21 0700   meropenem (MERREM) IV Stopped (07/24/21 0552)   milrinone 0.25 mcg/kg/min (07/24/21 0700)   norepinephrine (LEVOPHED) Adult infusion Stopped (07/22/21 1719)   vancomycin Stopped (07/24/21 0141)    PRN Medications: sodium chloride, albumin human **AND** sodium chloride, dextrose, morphine injection, ondansetron (ZOFRAN) IV, oxyCODONE, sodium chloride flush, traMADol   Assessment/Plan:    1. Acute on Chronic systolic CHF - Nonischemic CM, end-stage - Echo (9/21): EF 20-25%.   - Echo  8/22: EF < 20%, severe LV dilation, moderate RV dysfunction.   - RHC (8/22): well compensated hemodynamics. - s/p S-ICD - NYHA III-IIIB  - Body mass index is 45.42 kg/m. Not transplant candidate - RHC 10/14 with elevated filling pressures and low CO. Started milrinone 0.25 for optimization prior to VAD - S/p HM3 LVAD 10/17 (DT) - On milrinone 0.25. Co-ox 66% - Fluid overloaded, CVP 16. Wt up ~30 lb  - Diurese slowly, 40 mg IV lasix bolus followed by gtt at 8/hr    2 Atrial fibrillation: Paroxysmal - Emergent TEE-guided DCCV 7/27 due to inability to control HR. - Recurrent AF on 8/5. s/p DC-CV on 05/10/21.  - Seen by EP. Not a candidate for ablation due to body habitus  (could consider  AVN ablation and CRT, if needed)  - In NSR today - Continue IV amio to prevent recurrent VT while on inotropes  - warfarin per CT surgery    3. CKD stage 3B  - Has solitary kidney.   - Baseline creatinine is 1.6-2.9.  - Avoid hypotension. - SCr 1.66 today, monitor closely    4. OSA - Continue CPAP   5. DM2. Poorly controlled - On Jardiance. - Hgb A1c 9.4. - Cover with SSI   6. Hematuria - MRI and cystoscopy ok   7. Leukocytosis/Fever - WBC  14>>19K, Temp  99  - c/w broad spectrum abx for now - UA negative, cultures NGTD  - CXR no PNA    I reviewed the LVAD parameters from today, and compared the results to the patient's prior recorded data.  No programming changes were made.  The LVAD is functioning within specified parameters.  The patient performs LVAD self-test daily.  LVAD interrogation was negative for any significant power changes, alarms or PI events/speed drops.  LVAD equipment check completed and is in good working order.  Back-up equipment present.   LVAD education done on emergency procedures and precautions and reviewed exit site care.  Length of Stay: [address], [place] 07/24/2021, 8:09 AM  VAD Team --- VAD ISSUES ONLY--- Pager [phone] (7am - 7am)  Advanced Heart Failure Team  Pager [phone] (M-F; 7a - 5p)  Please contact [facility] for night-coverage after hours (5p -7a ) and weekends on [email or web address]  Agree with above.   Developed SOB and hypoxia overnight. CXR with pulmonary edema. Now on bipap. Remains on milrinone 0.25. Co-ox 66%. Swan numbers up a bit. CVP 16. Weight up 30 pounds.   Occasional PIs on VAD  General:  In chair on bipap .  HEENT: normal  Neck: supple. LIG swan  Carotids 2+ bilat; no bruits. No lymphadenopathy or thryomegaly appreciated. Cor: sternal dressing ok LVAD hum.  Lungs: Clear. Abdomen: obese soft, nontender, non-distended. hypoactive bowel sounds. Driveline site clean. Anchor in place.  Extremities: no cyanosis,  clubbing, rash. 2+ edema Neuro: alert & oriented x 3. No focal deficits. Moves all 4 without problem   Co-ox stable on milrinone. He is markedly volume overloaded. Give IV lasix and start gtt at 8. Can remove swan. Place PICC. I increased VAD speed to 5900 and also adjusted lower speed limit.   CRITICAL CARE Performed by: Glori Bickers  Total critical care time: 35 minutes  Critical care time was exclusive of separately billable procedures and treating other patients.  Critical care was necessary to treat or prevent imminent or life-threatening deterioration.  Critical care was time spent personally by me (independent of midlevel providers or residents) on the following activities: development of treatment plan with patient and/or surrogate as well as nursing, discussions with consultants, evaluation of patient's response to treatment, examination of patient, obtaining history from patient or surrogate, ordering and performing treatments and interventions, ordering and review of laboratory studies, ordering and review of radiographic studies, pulse oximetry and re-evaluation of patient's condition.  Glori Bickers, MD  12:30 PM

## 2021-07-24 NOTE — Progress Notes (Signed)
Patient placed on home CPAP with 6 Lpm O2 bleed in at this time.

## 2021-07-24 NOTE — Progress Notes (Signed)
  On milrinone and lasix gtt. Hemodynamics ok.   Decent urine output throughout day. Now off BIpap.   Developed AF with RVR earlier today. Now on amio gtt. Will give another bolus   VAD parameters stable.  D/w Dr. Cyndia Bent at bedside.    Additional 83min CCT  Glori Bickers, MD  5:51 PM

## 2021-07-24 NOTE — Progress Notes (Signed)
LVAD Coordinator Rounding Note:  Admitted 07/18/21 due to Dr. [name] for elective VAD placement.   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 Cyndia Bent under Destination Therapy criteria due to BMI and chronic kidney disease.   Pt had episode of hypoxia overnight; sats 88% on 5L. Placed on bipap. Chest xray obtained- showing worsening pulmonary vascular congestion. He is currently on Bipap; O2 sat 97%. Wt up 10 lbs overnight. Started on Lasix 8 mg/hr per Dr [name].   Pt sitting up in recliner upon my arrival. Reports he is having surgical chest pain. Received PRN Morphine this morning. Plan for further PRN pain medication per bedside RN. Assisted bedside RN to transfer patient back to bed. Pt able to stand and move to bed with 2 assist.  WBC 19.4 with low grade fever, blood cultures pending from 07/23/21. Urine negative.   Speed increased to 5900, low speed limit 5600 per Dr [name].   Vital signs: Temp:  99.0 HR: 89 A-Line:  115/94 (99) O2 Sat: 97% on Bipap  Wt:  277>192>285>196.3>305.5 lbs   LVAD interrogation reveals:  Speed: 5600 Flow:  4.3 Power:  4.6w PI: 3.9 Hct: 34  Alarms:  none Events:  none  Fixed speed:  5900 Low speed limit: 5600  Drive Line: Existing VAD dressing removed and site care performed using sterile technique. Drive line exit site cleaned with Chlora prep applicators x 2, allowed to dry, and gauze dressing with silver strip applied. Exit site with one suture intact, the velour is fully implanted at exit site; moderate amount bloody drainage with no redness, tenderness, or foul odor noted. Drive line anchor re-applied x 2. Will continue daily dressing changes per VAD Coordinator, Nurse Davonna Belling, or trained caregiver. Next dressing change due 07/25/21.      Labs:  LDH trend: 318>372>312  INR trend: 1.3>1.9>1.9  Anticoagulation Plan: -INR Goal: 2.0 - 2.5  -ASA Dose: none due to allergy  Blood Products:  - 07/21/21 2 units FFP (intra  op)  Device: - [employer] single ICD -Therapies: off   Respiratory: Extubated 07/22/21  Nitric Oxide:  Off 07/22/21  Infection:  - sputum culture 07/23/21>>> specimen rejected - urine culture 07/23/21>>>no growth (final) - blood culture 07/23/21>>>no growth 1 day  VAD Education: Unable to teach; pt currently on bipap, in pain. No family currently at bedside. Discussed importance of knowing where VAD equipment is at all times when transferring in/out of bed, ambulating, or moving in bed. Neck strap applied to controller. Demonstrated to patient proper way to wear controller.   Plan/Recommendations:  Call VAD Coordinator if any VAD equipment or drive line issues. Daily dressing changes per VAD Coordinator, Nurse Davonna Belling, or trained caregiver.  [name] RN [facility] Coordinator  Office: [phone]  24/7 Pager: [phone]

## 2021-07-24 NOTE — Progress Notes (Signed)
Patient ID: Jeffrey [name], male   DOB: 06-25-1976, 45 y.o.   MRN: [id number] HeartMate 3 Rounding Note  Subjective:    Hemodynamics stable overnight but developed hypoxemia with sats 88% on monitor and ABG 7.29, CO2 53, O2 51 81% on ABG with Co-ox 47.2.  +2300 cc yesterday without lasix. CVP 12, PA 53/30, CI 2.9  on milrinone 0.25.  CXR this am shows bilateral atelectasis in lower lobes and interstitial edema a little worse than yesterday.  Placed on bipap and sats now 98%. Co-ox 65.5   LVAD INTERROGATION:  HeartMate IIl LVAD:  Flow 4.3 liters/min, speed 5800, power 4.5, PI 4.7. few PI events last night.    Arterial tracing still very pulsatile.  Objective:    Vital Signs:   Temp:  [98.4 F (36.9 C)-100.2 F (37.9 C)] 99 F (37.2 C) (10/20 0600) Pulse Rate:  [84-192] 92 (10/20 0600) Resp:  [14-37] 23 (10/20 0600) BP: (83-114)/(74-84) 83/75 (10/20 0400) SpO2:  [79 %-98 %] 98 % (10/20 0600) Arterial Line BP: (83-129)/(69-91) 92/82 (10/20 0600) FiO2 (%):  [40 %] 40 % (10/19 0800) Weight:  [134.4 kg] 134.4 kg (10/19 0652) Last BM Date: 07/18/21 Mean arterial Pressure 86  Intake/Output:   Intake/Output Summary (Last 24 hours) at 07/24/2021 0649 Last data filed at 07/24/2021 0600 Gross per 24 hour  Intake 3507.09 ml  Output 1275 ml  Net 2232.09 ml     Physical Exam: General:  Well appearing. No resp difficulty HEENT: normal Cor: distant heart sounds with LVAD hum present. Lungs: decreased BS in lower lobes with crackles Abdomen: soft, nontender, nondistended. Good bowel sounds. Extremities:mild edema Neuro: alert & orientedx3, cranial nerves grossly intact. moves all 4 extremities w/o difficulty. Affect pleasant  Telemetry: sinus 91    Labs: Basic Metabolic Panel: Recent Labs  Lab 07/21/21 1324 07/21/21 1337 07/21/21 1815 07/21/21 1824 07/22/21 0024 07/22/21 0025 07/22/21 0306 07/22/21 1205 07/22/21 1651 07/22/21 1700 07/23/21 0424 07/24/21 0350  07/24/21 0401 07/24/21 0449  NA 139   < >  --    < >  --    < > 136   < > 135 138 131* 133* 134* 135  K 3.6   < >  --    < >  --    < > 4.7   < > 4.4 4.5 4.9 4.4 4.5 4.5  CL 106  --   --   --   --   --  106  --  105  --  101 101  --   --   CO2 24  --   --   --   --   --  21*  --  22  --  22 24  --   --   GLUCOSE 190*  --   --   --   --   --  119*  --  135*  --  207* 134*  --   --   BUN 23*  --   --   --   --   --  20  --  18  --  20 30*  --   --   CREATININE 1.73*  --  1.59*  --   --   --  1.42*  --  1.55*  --  1.63* 1.66*  --   --   CALCIUM 9.1  --   --   --   --   --  9.8  --  10.0  --  9.9  10.6*  --   --   MG 2.0  --  3.0*  --  2.4  --  2.4  --  1.9  --  2.2 2.3  --   --   PHOS  --   --   --   --   --   --  3.5  --   --   --  4.7* 5.0*  --   --    < > = values in this interval not displayed.    Liver Function Tests: Recent Labs  Lab 07/20/21 1107 07/22/21 0306 07/23/21 0424 07/24/21 0350  AST 20 60* 45* 26  ALT 42 _0 ALKPHOS 69 56 74 72  BILITOT 0.7 0.8 1.1 0.7  PROT 7.4 6.4* 6.5 6.5  ALBUMIN 3.4* 2.9* 2.5* 2.5*   No results for input(s): LIPASE, AMYLASE in the last 168 hours. No results for input(s): AMMONIA in the last 168 hours.  CBC: Recent Labs  Lab 07/22/21 0024 07/22/21 0025 07/22/21 0306 07/22/21 1205 07/22/21 1651 07/22/21 1700 07/23/21 0424 07/24/21 0350 07/24/21 0401 07/24/21 0449  WBC 10.1  --  10.4  --  14.4*  --  19.0* 19.4*  --   --   NEUTROABS  --   --  8.5*  --   --   --  15.6* 15.2*  --   --   HGB 11.3*   < > 11.2*   < > 11.7* 13.3 11.2* 10.6* 11.9* 11.9*  HCT 35.6*   < > 35.8*   < > 37.5* 39.0 35.7* 34.3* 35.0* 35.0*  MCV 77.6*  --  78.5*  --  78.3*  --  78.1* 79.2*  --   --   PLT 240  --  230  --  223  --  228 225  --   --    < > = values in this interval not displayed.    INR: Recent Labs  Lab 07/20/21 0500 07/21/21 1408 07/22/21 0306 07/23/21 0614 07/24/21 0350  INR 1.2 1.3* 1.3* 1.9* 1.9*    Other results: EKG:    Imaging: DG CHEST PORT 1 VIEW  Result Date: 07/23/2021 CLINICAL DATA:  Reason for exam: LVAD present Patient reports center chest pains without sob. Hx of chf, htn, diabetes. EXAM: PORTABLE CHEST - 1 VIEW COMPARISON:  the previous day's study FINDINGS: Interval extubation and removal of gastric tube. Left IJ Swan-Ganz, left chest tube, and LVAD stable in position. No pneumothorax. Relatively low lung volumes with some patchy atelectasis or consolidation at the left lung base. Blunting of lateral costophrenic angles suggesting effusions. Stable cardiomegaly.  Sternotomy wires. IMPRESSION: Extubation with residual patchy atelectasis/infiltrate at the left lung base. Electronically Signed   By: Lucrezia Europe M.D.   On: 07/23/2021 07:42     Medications:     Scheduled Medications:  acetaminophen  1,000 mg Oral Q6H   Or   acetaminophen (TYLENOL) oral liquid 160 mg/5 mL  1,000 mg Per Tube Q6H   bisacodyl  10 mg Oral Daily   Or   bisacodyl  10 mg Rectal Daily   chlorhexidine gluconate (MEDLINE KIT)  15 mL Mouth Rinse BID   Chlorhexidine Gluconate Cloth  6 each Topical Daily   docusate sodium  200 mg Oral Daily   furosemide  40 mg Intravenous Once   insulin aspart  0-20 Units Subcutaneous Q4H   insulin detemir  30 Units Subcutaneous BID   mouth rinse  15 mL Mouth Rinse BID  mupirocin ointment  1 application Nasal BID   pantoprazole  40 mg Oral Daily   sertraline  25 mg Oral Daily   sodium chloride flush  3 mL Intravenous Q12H   traZODone  100 mg Per Tube QHS   warfarin  2.5 mg Oral q1600   Warfarin - Physician Dosing Inpatient   Does not apply q1600    Infusions:  sodium chloride Stopped (07/22/21 2203)   sodium chloride 10 mL/hr at 07/24/21 0600   albumin human Stopped (07/21/21 1516)   And   sodium chloride     amiodarone 30 mg/hr (07/24/21 0600)   epinephrine Stopped (07/23/21 1015)   lactated ringers     lactated ringers 20 mL/hr at 07/24/21 0600   meropenem (MERREM) IV  Stopped (07/24/21 0552)   milrinone 0.25 mcg/kg/min (07/24/21 0606)   norepinephrine (LEVOPHED) Adult infusion Stopped (07/22/21 1719)   vancomycin Stopped (07/24/21 0141)    PRN Medications: sodium chloride, albumin human **AND** sodium chloride, dextrose, morphine injection, ondansetron (ZOFRAN) IV, oxyCODONE, sodium chloride flush, traMADol   Assessment:  POD 3 s/p HM 3 LVAD for acute on chronic systolic heart failure due to NICM. Mild to moderate RV dysfunction with normal valves.   Stage 3 CKD with solitary kidney. Baseline creat 1.6-2 over the past month but was up to 4.6 at end of July. 1.5 preop.  Hematuria with MRI and cysto ok.    OSA on CPAP. Postop hypoxemia overnight probably due to combination of atelectasis, volume overload. I doubt pneumonia at this point although he had low grade fever yesterday and leukocytosis. He has been on periop antibiotics.    PAF: on amio IV. S/p DCCV in August. Maintaining sinus rhythm.   Poorly controlled DM preop. His glucose is under good control postop on Levemir and SSI.    Plan/Discussion:    Hemodynamics stable on current VAD speed. Still has very pulsatile arterial line. CVP reasonable. Moderate pulmonary HTN. Continue diuresis today. Did not receive any lasix yesterday.  Bipap as needed. IS, pain control. Start PT as possible. Up in chair.  Keep pocket drain and left pleural tube in.  INR stable at 1.9. Will give 2.5  Coumadin today.    I reviewed the LVAD parameters from today, and compared the results to the patient's prior recorded data.  No programming changes were made.  The LVAD is functioning within specified parameters.  LVAD interrogation was negative for any significant power changes, alarms or PI events/speed drops.  LVAD equipment check completed and is in good working order.  Back-up equipment present.     Length of Stay: 6  Gaye Pollack 07/24/2021, 6:49 AM

## 2021-07-24 NOTE — Progress Notes (Signed)
Occupational Therapy Treatment Patient Details Name: Jeffrey [name] MRN: [id number] DOB: 1976/08/03 Today's Date: 07/24/2021   History of present illness Pt is a 45 y.o. male with class IV HF admitted 07/18/21 for pre-VAD optimization; has been relatively stable as outpatient with NYHA 3B symptoms. S/p RHC with swan placement 10/14. S/p HM3 LVAD implant on 10/17. Hypoxia overnight 10/19 requiring bipap. PMH includes poorly controlled HTN, renal cell carcinoma s/p nephrectomy, CKD3, DM2, OSA, gout, morbid obesity, HF (EF<20%).   OT comments  Pt limited by elevated HR (164) with ambulation. Only requires min guard assist with EVA walker to ambulate. Began educating pt in names of parts of LVAD equipment and pt switched power sources with min assist and verbal cues. Moderate assistance to don vest. Pt does well with encouragement.   Recommendations for follow up therapy are one component of a multi-disciplinary discharge planning process, led by the attending physician.  Recommendations may be updated based on patient status, additional functional criteria and insurance authorization.    Follow Up Recommendations  No OT follow up    Equipment Recommendations  3 in 1 bedside commode (bariatric)    Recommendations for Other Services      Precautions / Restrictions Precautions Precautions: Fall;Sternal;Other (comment) Precaution Comments: LVAD; multiple lines including art line, chest tube       Mobility Bed Mobility Overal bed mobility: Needs Assistance Bed Mobility: Supine to Sit     Supine to sit: +2 for safety/equipment;Min assist     General bed mobility comments: Increased time, minA for trunk elevation, HOB elevated    Transfers Overall transfer level: Needs assistance Equipment used: 4-wheeled walker (eva walker) Transfers: Sit to/from Stand Sit to Stand: Min guard;+2 safety/equipment         General transfer comment: Pt able to stand with BUE support holding pillow,  use of momentum to power into standing, min guard for balance/lines    Balance Overall balance assessment: Needs assistance Sitting-balance support: No upper extremity supported;Feet supported Sitting balance-Leahy Scale: Fair     Standing balance support: No upper extremity supported;During functional activity Standing balance-Leahy Scale: Fair Standing balance comment: Can static stand without UE support, reliant on walker for ambulation                           ADL either performed or assessed with clinical judgement   ADL Overall ADL's : Needs assistance/impaired                 Upper Body Dressing : Moderate assistance;Sitting Upper Body Dressing Details (indicate cue type and reason): LVAD vest                 Functional mobility during ADLs: Min guard;Rolling walker;+2 for safety/equipment General ADL Comments: Educated in changing battery sources, keeping black bag with him, names of LVAD equipment, pt able to return demo.     Vision       Perception     Praxis      Cognition Arousal/Alertness: Awake/alert Behavior During Therapy: WFL for tasks assessed/performed Overall Cognitive Status: Within Functional Limits for tasks assessed                                 General Comments: Improved affect this session, needs encouragment to push himself.        Exercises     Shoulder Instructions  General Comments HR up to 164 with mobility, RN present to administer amio. Ambulated on 6L O2 [place], intermittent cues for pursed lip breathing. Initiated education on switching LVAD from wall<>battery power, pt able to do so himself with verbal cues and some assist; good recall of educ    Pertinent Vitals/ Pain       Pain Assessment: Faces Faces Pain Scale: Hurts little more Pain Location: Sternal incision/abdomen Pain Descriptors / Indicators: Discomfort;Guarding;Sore Pain Intervention(s): Patient requesting pain meds-RN  notified;RN gave pain meds during session;Repositioned;Monitored during session  Home Living                                          Prior Functioning/Environment              Frequency  Min 2X/week        Progress Toward Goals  OT Goals(current goals can now be found in the care plan section)  Progress towards OT goals: Progressing toward goals  Acute Rehab OT Goals Patient Stated Goal: Go home and hug my kids OT Goal Formulation: With patient Time For Goal Achievement: 08/06/21 Potential to Achieve Goals: Good  Plan Discharge plan remains appropriate    Co-evaluation    PT/OT/SLP Co-Evaluation/Treatment: Yes Reason for Co-Treatment: Complexity of the patient's impairments (multi-system involvement)   OT goals addressed during session: ADL's and self-care      AM-PAC OT "6 Clicks" Daily Activity     Outcome Measure   Help from another person eating meals?: A Little Help from another person taking care of personal grooming?: A Little Help from another person toileting, which includes using toliet, bedpan, or urinal?: Total Help from another person bathing (including washing, rinsing, drying)?: A Lot Help from another person to put on and taking off regular upper body clothing?: A Lot Help from another person to put on and taking off regular lower body clothing?: Total 6 Click Score: 12    End of Session Equipment Utilized During Treatment: Oxygen;Rolling walker  OT Visit Diagnosis: Unsteadiness on feet (R26.81);Other abnormalities of gait and mobility (R26.89);Pain;Muscle weakness (generalized) (M62.81)   Activity Tolerance Patient tolerated treatment well   Patient Left in chair;with call [place]/phone within reach;with nursing/sitter in room   Nurse Communication Mobility status        Time: 1610-9604 OT Time Calculation (min): 47 min  Charges: OT General Charges $OT Visit: 1 Visit OT Treatments $Therapeutic Activity: 8-22  mins  Nestor Lewandowsky, OTR/L Acute Rehabilitation Services Pager: [phone] Office: [phone]   Malka So 07/24/2021, 3:40 PM

## 2021-07-24 NOTE — Progress Notes (Signed)
CSW met with patient at bedside. Patient shared that he received a letter stating that he was denied for disability. CSW discussed with patient and it appears as though he was denied SSD based on not enough quarters worked therefore would be assessed for SSI benefits. CSW asked patient to have his wife bring in the letter from social security for CSW to review and assist as needed.Raquel Sarna, [place], [place]

## 2021-07-24 NOTE — Progress Notes (Signed)
Physical Therapy Treatment Patient Details Name: Jeffrey [name] MRN: [id number] DOB: 27-Jan-1976 Today's Date: 07/24/2021   History of Present Illness Pt is a 45 y.o. male with class IV HF admitted 07/18/21 for pre-VAD optimization; has been relatively stable as outpatient with NYHA 3B symptoms. S/p RHC with swan placement 10/14. S/p HM3 LVAD implant on 10/17. Hypoxia overnight 10/19 requiring bipap. PMH includes poorly controlled HTN, renal cell carcinoma s/p nephrectomy, CKD3, DM2, OSA, gout, morbid obesity, HF (EF<20%).   PT Comments    Pt progressing with mobility. Today's session focused on transfer and gait training with eva walker, as well as initiating switching LVAD from wall<>battery power; pt able to ambulate 32' with min guard (+2 assist for lines/safety); HR up to 164 with ambulation (RN aware). Pt remains limited by generalized weakness and decreased activity tolerance. Will continue to follow acutely to address established goals.    Recommendations for follow up therapy are one component of a multi-disciplinary discharge planning process, led by the attending physician.  Recommendations may be updated based on patient status, additional functional criteria and insurance authorization.  Follow Up Recommendations  Home health PT;Supervision for mobility/OOB     Equipment Recommendations   (TBD - potential rollator)    Recommendations for Other Services       Precautions / Restrictions Precautions Precautions: Fall;Sternal;Other (comment) Precaution Comments: LVAD; multiple lines including art line, chest tube     Mobility  Bed Mobility Overal bed mobility: Needs Assistance       Supine to sit: Min assist;+2 for safety/equipment;HOB elevated     General bed mobility comments: Increased time, minA for trunk elevation, HOB elevated    Transfers Overall transfer level: Needs assistance Equipment used: 4-wheeled walker (eva walker) Transfers: Sit to/from Stand Sit  to Stand: Min guard;+2 safety/equipment         General transfer comment: Pt able to stand with BUE support holding pillow, use of momentum to power into standing, min guard for balance/lines  Ambulation/Gait Ambulation/Gait assistance: Min guard;+2 safety/equipment (+3 chair follow) Gait Distance (Feet): 46 Feet Assistive device: 4-wheeled walker (eva walker) Gait Pattern/deviations: Step-through pattern;Decreased stride length;Trunk flexed Gait velocity: Decreased   General Gait Details: Slow, fatigued gait with eva walker and min guard for safety balance, +2-3 assist for safety/lines and chair follow   Stairs             Wheelchair Mobility    Modified Rankin (Stroke Patients Only)       Balance Overall balance assessment: Needs assistance Sitting-balance support: No upper extremity supported;Feet supported Sitting balance-Leahy Scale: Fair     Standing balance support: No upper extremity supported;During functional activity Standing balance-Leahy Scale: Fair Standing balance comment: Can static stand without UE support, reliant on walker for ambulation                            Cognition Arousal/Alertness: Awake/alert Behavior During Therapy: WFL for tasks assessed/performed Overall Cognitive Status: Within Functional Limits for tasks assessed                                 General Comments: Improved affect this session      Exercises      General Comments General comments (skin integrity, edema, etc.): HR up to 164 with mobility, RN present to administer amio. Ambulated on 6L O2 [place], intermittent cues for pursed lip breathing.  Initiated education on switching LVAD from wall<>battery power, pt able to do so himself with verbal cues and some assist; good recall of educ      Pertinent Vitals/Pain Pain Assessment: Faces Faces Pain Scale: Hurts little more Pain Location: Sternal incision/abdomen Pain Descriptors / Indicators:  Discomfort;Guarding;Sore Pain Intervention(s): Monitored during session;Limited activity within patient's tolerance    Home Living                      Prior Function            PT Goals (current goals can now be found in the care plan section) Progress towards PT goals: Progressing toward goals    Frequency    Min 3X/week      PT Plan Current plan remains appropriate    Co-evaluation              AM-PAC PT "6 Clicks" Mobility   Outcome Measure  Help needed turning from your back to your side while in a flat bed without using bedrails?: A Little Help needed moving from lying on your back to sitting on the side of a flat bed without using bedrails?: A Lot Help needed moving to and from a bed to a chair (including a wheelchair)?: A Little Help needed standing up from a chair using your arms (e.g., wheelchair or bedside chair)?: A Little Help needed to walk in hospital room?: A Little Help needed climbing 3-5 steps with a railing? : A Lot 6 Click Score: 16    End of Session Equipment Utilized During Treatment: Oxygen Activity Tolerance: Patient tolerated treatment well Patient left: in chair;with call [place]/phone within reach;with nursing/sitter in room Nurse Communication: Mobility status PT Visit Diagnosis: Other abnormalities of gait and mobility (R26.89);Difficulty in walking, not elsewhere classified (R26.2)     Time: 1424-1510 PT Time Calculation (min) (ACUTE ONLY): 46 min  Charges:  $Gait Training: 8-22 mins $Therapeutic Activity: 8-22 mins                     Mabeline Caras, PT, DPT Acute Rehabilitation Services  Pager [phone] Office [place] 07/24/2021, 3:29 PM

## 2021-07-24 NOTE — Progress Notes (Signed)
Peripherally Inserted Central Catheter Placement  The IV Nurse has discussed with the patient and/or persons authorized to consent for the patient, the purpose of this procedure and the potential benefits and risks involved with this procedure.  The benefits include less needle sticks, lab draws from the catheter, and the patient may be discharged home with the catheter. Risks include, but not limited to, infection, bleeding, blood clot (thrombus formation), and puncture of an artery; nerve damage and irregular heartbeat and possibility to perform a PICC exchange if needed/ordered by physician.  Alternatives to this procedure were also discussed.  Bard Power PICC patient education guide, fact sheet on infection prevention and patient information card has been provided to patient /or left at bedside.    PICC Placement Documentation  PICC Triple Lumen 24/23/53 PICC Right Basilic 42 cm 0 cm (Active)  Indication for Insertion or Continuance of Line Vasoactive infusions 07/24/21 2252  Exposed Catheter (cm) 0 cm 07/24/21 2252  Site Assessment Clean;Dry;Intact 07/24/21 2252  Lumen #1 Status Flushed;Blood return noted;Saline locked 07/24/21 2252  Lumen #2 Status Flushed;Blood return noted;Saline locked 07/24/21 2252  Lumen #3 Status Flushed;Blood return noted;Saline locked 07/24/21 2252  Dressing Type Transparent 07/24/21 2252  Dressing Status Clean;Dry;Intact 07/24/21 2252  Antimicrobial disc in place? Yes 07/24/21 2252  Safety Lock Not Applicable 61/44/31 5400  Line Care Connections checked and tightened 07/24/21 2252  Dressing Intervention New dressing 07/24/21 2252  Dressing Change Due 07/31/21 07/24/21 2252       Makennah Omura, Cathlyn Parsons 07/24/2021, 10:53 PM

## 2021-07-25 ENCOUNTER — Inpatient Hospital Stay (HOSPITAL_COMMUNITY): Payer: Medicaid Other

## 2021-07-25 ENCOUNTER — Other Ambulatory Visit: Payer: Self-pay | Admitting: Obstetrics and Gynecology

## 2021-07-25 ENCOUNTER — Encounter (HOSPITAL_COMMUNITY): Payer: Self-pay | Admitting: Surgery

## 2021-07-25 DIAGNOSIS — R0602 Shortness of breath: Secondary | ICD-10-CM | POA: Diagnosis not present

## 2021-07-25 DIAGNOSIS — I4891 Unspecified atrial fibrillation: Secondary | ICD-10-CM | POA: Diagnosis not present

## 2021-07-25 DIAGNOSIS — Z95811 Presence of heart assist device: Secondary | ICD-10-CM

## 2021-07-25 DIAGNOSIS — I5022 Chronic systolic (congestive) heart failure: Secondary | ICD-10-CM | POA: Diagnosis not present

## 2021-07-25 DIAGNOSIS — I5023 Acute on chronic systolic (congestive) heart failure: Secondary | ICD-10-CM | POA: Diagnosis not present

## 2021-07-25 DIAGNOSIS — I517 Cardiomegaly: Secondary | ICD-10-CM | POA: Diagnosis not present

## 2021-07-25 DIAGNOSIS — I428 Other cardiomyopathies: Secondary | ICD-10-CM | POA: Diagnosis not present

## 2021-07-25 LAB — CBC WITH DIFFERENTIAL/PLATELET
Abs Immature Granulocytes: 0.06 10*3/uL (ref 0.00–0.07)
Basophils Absolute: 0 10*3/uL (ref 0.0–0.1)
Basophils Relative: 0 %
Eosinophils Absolute: 0.2 10*3/uL (ref 0.0–0.5)
Eosinophils Relative: 2 %
HCT: 32.4 % — ABNORMAL LOW (ref 39.0–52.0)
Hemoglobin: 10.1 g/dL — ABNORMAL LOW (ref 13.0–17.0)
Immature Granulocytes: 1 %
Lymphocytes Relative: 11 %
Lymphs Abs: 1.4 10*3/uL (ref 0.7–4.0)
MCH: 24.2 pg — ABNORMAL LOW (ref 26.0–34.0)
MCHC: 31.2 g/dL (ref 30.0–36.0)
MCV: 77.7 fL — ABNORMAL LOW (ref 80.0–100.0)
Monocytes Absolute: 1.7 10*3/uL — ABNORMAL HIGH (ref 0.1–1.0)
Monocytes Relative: 13 %
Neutro Abs: 9.4 10*3/uL — ABNORMAL HIGH (ref 1.7–7.7)
Neutrophils Relative %: 73 %
Platelets: 235 10*3/uL (ref 150–400)
RBC: 4.17 MIL/uL — ABNORMAL LOW (ref 4.22–5.81)
RDW: 16.4 % — ABNORMAL HIGH (ref 11.5–15.5)
WBC: 12.8 10*3/uL — ABNORMAL HIGH (ref 4.0–10.5)
nRBC: 0.9 % — ABNORMAL HIGH (ref 0.0–0.2)

## 2021-07-25 LAB — POCT I-STAT 7, (LYTES, BLD GAS, ICA,H+H)
Acid-Base Excess: 3 mmol/L — ABNORMAL HIGH (ref 0.0–2.0)
Bicarbonate: 28.9 mmol/L — ABNORMAL HIGH (ref 20.0–28.0)
Calcium, Ion: 1.49 mmol/L — ABNORMAL HIGH (ref 1.15–1.40)
HCT: 32 % — ABNORMAL LOW (ref 39.0–52.0)
Hemoglobin: 10.9 g/dL — ABNORMAL LOW (ref 13.0–17.0)
O2 Saturation: 94 %
Patient temperature: 98.8
Potassium: 3.8 mmol/L (ref 3.5–5.1)
Sodium: 136 mmol/L (ref 135–145)
TCO2: 30 mmol/L (ref 22–32)
pCO2 arterial: 50.3 mmHg — ABNORMAL HIGH (ref 32.0–48.0)
pH, Arterial: 7.368 (ref 7.350–7.450)
pO2, Arterial: 73 mmHg — ABNORMAL LOW (ref 83.0–108.0)

## 2021-07-25 LAB — COOXEMETRY PANEL
Carboxyhemoglobin: 1 % (ref 0.5–1.5)
Methemoglobin: 0.8 % (ref 0.0–1.5)
O2 Saturation: 65.3 %
Total hemoglobin: 10.5 g/dL — ABNORMAL LOW (ref 12.0–16.0)

## 2021-07-25 LAB — BASIC METABOLIC PANEL
Anion gap: 7 (ref 5–15)
Anion gap: 12 (ref 5–15)
BUN: 31 mg/dL — ABNORMAL HIGH (ref 6–20)
BUN: 28 mg/dL — ABNORMAL HIGH (ref 6–20)
CO2: 26 mmol/L (ref 22–32)
CO2: 24 mmol/L (ref 22–32)
Calcium: 10.2 mg/dL (ref 8.9–10.3)
Calcium: 9.9 mg/dL (ref 8.9–10.3)
Chloride: 102 mmol/L (ref 98–111)
Chloride: 96 mmol/L — ABNORMAL LOW (ref 98–111)
Creatinine, Ser: 1.47 mg/dL — ABNORMAL HIGH (ref 0.61–1.24)
Creatinine, Ser: 1.45 mg/dL — ABNORMAL HIGH (ref 0.61–1.24)
GFR, Estimated: 59 mL/min — ABNORMAL LOW (ref >60)
GFR, Estimated: >60 mL/min (ref >60)
Glucose, Bld: 131 mg/dL — ABNORMAL HIGH (ref 70–99)
Glucose, Bld: 193 mg/dL — ABNORMAL HIGH (ref 70–99)
Potassium: 3.9 mmol/L (ref 3.5–5.1)
Potassium: 3.5 mmol/L (ref 3.5–5.1)
Sodium: 135 mmol/L (ref 135–145)
Sodium: 132 mmol/L — ABNORMAL LOW (ref 135–145)

## 2021-07-25 LAB — PHOSPHORUS: Phosphorus: 3.8 mg/dL (ref 2.5–4.6)

## 2021-07-25 LAB — PROTIME-INR
INR: 2 — ABNORMAL HIGH (ref 0.8–1.2)
Prothrombin Time: 22.3 seconds — ABNORMAL HIGH (ref 11.4–15.2)

## 2021-07-25 LAB — GLUCOSE, CAPILLARY
Glucose-Capillary: 105 mg/dL — ABNORMAL HIGH (ref 70–99)
Glucose-Capillary: 116 mg/dL — ABNORMAL HIGH (ref 70–99)
Glucose-Capillary: 130 mg/dL — ABNORMAL HIGH (ref 70–99)
Glucose-Capillary: 131 mg/dL — ABNORMAL HIGH (ref 70–99)
Glucose-Capillary: 134 mg/dL — ABNORMAL HIGH (ref 70–99)
Glucose-Capillary: 146 mg/dL — ABNORMAL HIGH (ref 70–99)
Glucose-Capillary: 201 mg/dL — ABNORMAL HIGH (ref 70–99)

## 2021-07-25 LAB — LACTATE DEHYDROGENASE: LDH: 302 U/L — ABNORMAL HIGH (ref 98–192)

## 2021-07-25 LAB — MAGNESIUM: Magnesium: 2.2 mg/dL (ref 1.7–2.4)

## 2021-07-25 MED ORDER — POTASSIUM CHLORIDE CRYS ER 20 MEQ PO TBCR
20.0000 meq | EXTENDED_RELEASE_TABLET | ORAL | Status: AC
  Administered 2021-07-26 (×2): 20 meq via ORAL
  Filled 2021-07-25 (×3): qty 1

## 2021-07-25 MED ORDER — POTASSIUM CHLORIDE CRYS ER 20 MEQ PO TBCR
20.0000 meq | EXTENDED_RELEASE_TABLET | Freq: Two times a day (BID) | ORAL | Status: AC
  Administered 2021-07-25 (×2): 20 meq via ORAL
  Filled 2021-07-25 (×2): qty 1

## 2021-07-25 MED ORDER — ALTEPLASE 2 MG IJ SOLR: 2.0000 mg | Freq: Once | INTRAMUSCULAR | Status: DC

## 2021-07-25 MED ORDER — AMIODARONE LOAD VIA INFUSION
150.0000 mg | Freq: Once | INTRAVENOUS | Status: AC
  Administered 2021-07-25: 150 mg via INTRAVENOUS
  Filled 2021-07-25: qty 83.34

## 2021-07-25 MED ORDER — POTASSIUM CHLORIDE CRYS ER 20 MEQ PO TBCR
40.0000 meq | EXTENDED_RELEASE_TABLET | Freq: Once | ORAL | Status: AC
  Administered 2021-07-25: 40 meq via ORAL
  Filled 2021-07-25: qty 2

## 2021-07-25 MED ORDER — AMIODARONE IV BOLUS ONLY 150 MG/100ML
150.0000 mg | Freq: Once | INTRAVENOUS | Status: AC
  Administered 2021-07-25: 150 mg via INTRAVENOUS
  Filled 2021-07-25: qty 100

## 2021-07-25 MED ORDER — METOLAZONE 2.5 MG PO TABS
2.5000 mg | ORAL_TABLET | Freq: Once | ORAL | Status: AC
  Administered 2021-07-25: 2.5 mg via ORAL
  Filled 2021-07-25: qty 1

## 2021-07-25 MED ORDER — SORBITOL 70 % SOLN
30.0000 mL | Freq: Once | Status: AC
  Administered 2021-07-25: 30 mL via ORAL
  Filled 2021-07-25: qty 30

## 2021-07-25 MED ORDER — AMIODARONE LOAD VIA INFUSION
150.0000 mg | Freq: Once | INTRAVENOUS | Status: DC
  Filled 2021-07-25: qty 83.34

## 2021-07-25 MED ORDER — COLCHICINE 0.6 MG PO TABS
0.6000 mg | ORAL_TABLET | Freq: Every day | ORAL | Status: DC
  Administered 2021-07-26 – 2021-08-05 (×12): 0.6 mg via ORAL
  Filled 2021-07-25 (×14): qty 1

## 2021-07-25 NOTE — Progress Notes (Signed)
Advanced Heart Failure VAD Team Note  PCP-Cardiologist: Glori Bickers, MD   Subjective:    10/17: HM3 LVAD implant (DT)  10/18: Extubated   On milrinone at 0.25. co-ox 65%  On lasix gtt at 8. Weight unchanged. Still up 28 pounds  VAD speed increased to 5900   LVAD INTERROGATION:  HeartMate III LVAD:   Flow 5.2 liters/min, speed 5900, power 5.0, PI 3.6. 1 PI events  Objective:    Vital Signs:   Temp:  [98.4 F (36.9 C)-99 F (37.2 C)] 98.8 F (37.1 C) (10/21 0400) Pulse Rate:  [65-180] 146 (10/21 0500) Resp:  [16-33] 19 (10/21 0500) BP: (85-96)/(65-87) 88/80 (10/21 0000) SpO2:  [91 %-98 %] 95 % (10/21 0500) Arterial Line BP: (76-140)/(69-96) 85/76 (10/21 0500) Weight:  [138.6 kg] 138.6 kg (10/21 0500) Last BM Date: 07/18/21 Mean arterial Pressure 80-90  Intake/Output:   Intake/Output Summary (Last 24 hours) at 07/25/2021 0830 Last data filed at 07/25/2021 0600 Gross per 24 hour  Intake 2566.76 ml  Output 2350 ml  Net 216.76 ml      Physical Exam    CVP 13 General:  Sitting in chair NAD.  HEENT: normal  Neck: supple. JVP not elevated.  Carotids 2+ bilat; no bruits. No lymphadenopathy or thryomegaly appreciated. Cor: sternal wound ok LVAD hum.  Lungs: Clear. Abdomen: obese soft, nontender, non-distended. No hepatosplenomegaly. No bruits or masses. Good bowel sounds. Driveline site clean. Anchor in place.  Extremities: no cyanosis, clubbing, rash. Warm 2+ edema  Neuro: alert & oriented x 3. No focal deficits. Moves all 4 without problem     Telemetry   AF 130-140 Personally reviewed   Labs   Basic Metabolic Panel: Recent Labs  Lab 07/22/21 0306 07/22/21 1205 07/22/21 1651 07/22/21 1700 07/23/21 0424 07/24/21 0350 07/24/21 0401 07/24/21 0449 07/25/21 0323 07/25/21 0436  NA 136   < > 135   < > 131* 133* 134* 135 135 136  K 4.7   < > 4.4   < > 4.9 4.4 4.5 4.5 3.9 3.8  CL 106  --  105  --  101 101  --   --  102  --   CO2 21*  --  22  --   22 24  --   --  26  --   GLUCOSE 119*  --  135*  --  207* 134*  --   --  131*  --   BUN 20  --  18  --  20 30*  --   --  31*  --   CREATININE 1.42*  --  1.55*  --  1.63* 1.66*  --   --  1.47*  --   CALCIUM 9.8  --  10.0  --  9.9 10.6*  --   --  10.2  --   MG 2.4  --  1.9  --  2.2 2.3  --   --  2.2  --   PHOS 3.5  --   --   --  4.7* 5.0*  --   --  3.8  --    < > = values in this interval not displayed.     Liver Function Tests: Recent Labs  Lab 07/20/21 1107 07/22/21 0306 07/23/21 0424 07/24/21 0350  AST 20 60* 45* 26  ALT 42 _0 ALKPHOS 69 56 74 72  BILITOT 0.7 0.8 1.1 0.7  PROT 7.4 6.4* 6.5 6.5  ALBUMIN 3.4* 2.9* 2.5* 2.5*    No  results for input(s): LIPASE, AMYLASE in the last 168 hours. No results for input(s): AMMONIA in the last 168 hours.  CBC: Recent Labs  Lab 07/22/21 0306 07/22/21 1205 07/22/21 1651 07/22/21 1700 07/23/21 0424 07/24/21 0350 07/24/21 0401 07/24/21 0449 07/25/21 0323 07/25/21 0436  WBC 10.4  --  14.4*  --  19.0* 19.4*  --   --  12.8*  --   NEUTROABS 8.5*  --   --   --  15.6* 15.2*  --   --  9.4*  --   HGB 11.2*   < > 11.7*   < > 11.2* 10.6* 11.9* 11.9* 10.1* 10.9*  HCT 35.8*   < > 37.5*   < > 35.7* 34.3* 35.0* 35.0* 32.4* 32.0*  MCV 78.5*  --  78.3*  --  78.1* 79.2*  --   --  77.7*  --   PLT 230  --  223  --  228 225  --   --  235  --    < > = values in this interval not displayed.     INR: Recent Labs  Lab 07/21/21 1408 07/22/21 0306 07/23/21 0614 07/24/21 0350 07/25/21 0323  INR 1.3* 1.3* 1.9* 1.9* 2.0*     Other results: EKG:    Imaging   DG CHEST PORT 1 VIEW  Result Date: 07/25/2021 CLINICAL DATA:  Chest tube present status post open heart surgery, LVAD, shortness of breath EXAM: PORTABLE CHEST 1 VIEW COMPARISON:  Chest radiograph 1 day prior FINDINGS: The right upper extremity PICC terminating in the mid SVC is unchanged. The LVAD, left chest tube, defibrillator, and median sternotomy wires are stable. The  cardiomediastinal silhouette is stable, with unchanged cardiomegaly. There is mild pulmonary interstitial edema, not significantly changed. There is no significant or enlarging pleural effusion. There is no pneumothorax. The bones are stable. IMPRESSION: Mild pulmonary interstitial edema, not significantly changed. No new or worsening airspace disease. Electronically Signed   By: Valetta Mole M.D.   On: 07/25/2021 08:23   DG Chest Port 1 View  Result Date: 07/24/2021 CLINICAL DATA:  Check PICC line placement EXAM: PORTABLE CHEST 1 VIEW COMPARISON:  07/24/2021 FINDINGS: New right-sided PICC line is noted in satisfactory position with the catheter tip in the mid superior vena cava. Cardiac shadow is enlarged but stable. Defibrillator and LVAD device are again seen. Postsurgical changes are noted. Left chest tube is seen and stable. No pneumothorax is noted. Mild vascular congestion is seen. Swan-Ganz catheter has been removed. IMPRESSION: Mild vascular congestion stable from the prior exam. Tubes and lines as described. No pneumothorax is noted. Electronically Signed   By: [name] Catalina M.D.   On: 07/24/2021 22:39   DG CHEST PORT 1 VIEW  Result Date: 07/24/2021 CLINICAL DATA:  Chest tube present s/p LVAD surg,,sore chest today EXAM: PORTABLE CHEST - 1 VIEW COMPARISON:  the previous day's study FINDINGS: Left IJ Swan-Ganz remains in the proximal right pulmonary artery. Left chest tube stable with no pneumothorax. Visualized LVAD components stable. Some increase in bilateral pulmonary vascular congestion. Relatively low lung volumes. Stable cardiomegaly.  Sternotomy wires. No pneumothorax.  No definite pleural effusion. IMPRESSION: Worsening bilateral pulmonary vascular congestion. Support hardware stable. Electronically Signed   By: Lucrezia Europe M.D.   On: 07/24/2021 08:22   [place] EKG SITE RITE  Result Date: 07/24/2021 If Site Rite image not attached, placement could not be confirmed due to current cardiac  rhythm.    Medications:     Scheduled Medications:  acetaminophen  1,000 mg Oral Q6H   Or   acetaminophen (TYLENOL) oral liquid 160 mg/5 mL  1,000 mg Per Tube Q6H   amiodarone  150 mg Intravenous Once   bisacodyl  10 mg Oral Daily   Or   bisacodyl  10 mg Rectal Daily   chlorhexidine gluconate (MEDLINE KIT)  15 mL Mouth Rinse BID   Chlorhexidine Gluconate Cloth  6 each Topical Daily   docusate sodium  200 mg Oral Daily   furosemide  40 mg Intravenous Once   insulin aspart  0-20 Units Subcutaneous Q4H   insulin detemir  30 Units Subcutaneous BID   mouth rinse  15 mL Mouth Rinse BID   mupirocin ointment  1 application Nasal BID   pantoprazole  40 mg Oral Daily   polyethylene glycol  17 g Oral BID   potassium chloride  20 mEq Oral BID   sertraline  25 mg Oral Daily   sodium chloride flush  10-40 mL Intracatheter Q12H   sodium chloride flush  3 mL Intravenous Q12H   traZODone  100 mg Per Tube QHS   warfarin  2.5 mg Oral q1600   Warfarin - Physician Dosing Inpatient   Does not apply q1600    Infusions:  sodium chloride Stopped (07/22/21 2203)   sodium chloride Stopped (07/24/21 1320)   albumin human Stopped (07/21/21 1516)   And   sodium chloride     amiodarone 60 mg/hr (07/25/21 0600)   furosemide (LASIX) 200 mg in dextrose 5% 100 mL (38m/mL) infusion 8 mg/hr (07/25/21 0600)   lactated ringers     meropenem (MERREM) IV 1 g (07/25/21 0600)   milrinone 0.25 mcg/kg/min (07/25/21 0707)   vancomycin Stopped (07/25/21 0039)    PRN Medications: sodium chloride, albumin human **AND** sodium chloride, dextrose, morphine injection, ondansetron (ZOFRAN) IV, oxyCODONE, sodium chloride flush, sodium chloride flush, traMADol   Assessment/Plan:    1. Acute on Chronic systolic CHF - Nonischemic CM, end-stage - Echo (9/21): EF 20-25%.   - Echo  8/22: EF < 20%, severe LV dilation, moderate RV dysfunction.   - RHC (8/22): well compensated hemodynamics. - s/p S-ICD - NYHA IV  -  Body mass index is 45.42 kg/m. Not transplant candidate - S/p HM3 LVAD 10/17 (DT) - On milrinone 0.25. Co-ox 65% - Remains volume overloaded, CVP 13 Wt up ~28 lb  - Increase lasix gtt to 15. Give metolazone 2.5  - Continue milrinone for RV support until better diuresed  2. VAD  - s/p HM-3 placement 10/17 - LDH 302 - MAPs ok - PI running low. May need to drop speed to 5800 - Ramp echo on Monday - INR 2.0  3 Atrial fibrillation: Paroxysmal - Had severe AF with RVR 7/22 s/p DC-CV x 2 - Seen by EP. Not a candidate for ablation due to body habitus  (could consider AVN ablation and CRT, if needed)  - Back in AF with RVR post VAD - Continue IV amio. Will bolus more today - warfarin per CT surgery    4. CKD stage 3B  - Has solitary kidney.   - Baseline creatinine is 1.6-2.9.  - SCr 1.66 today, monitor closely    5. OSA - Continue CPAP   6. DM2. Poorly controlled - On Jardiance. - Hgb A1c 9.4. - Cover with SSI   7. Hematuria - MRI and cystoscopy ok     I reviewed the LVAD parameters from today, and compared the results to the patient's prior recorded  data.  No programming changes were made.  The LVAD is functioning within specified parameters.  The patient performs LVAD self-test daily.  LVAD interrogation was negative for any significant power changes, alarms or PI events/speed drops.  LVAD equipment check completed and is in good working order.  Back-up equipment present.   LVAD education done on emergency procedures and precautions and reviewed exit site care.  Length of Stay: [place], MD 07/25/2021, 8:30 AM  VAD Team --- VAD ISSUES ONLY--- Pager [phone] (7am - 7am)  Advanced Heart Failure Team  Pager [phone] (M-F; 7a - 5p)  Please contact [facility] for night-coverage after hours (5p -7a ) and weekends on [email or web address]

## 2021-07-25 NOTE — Progress Notes (Signed)
LVAD Coordinator Rounding Note:  Admitted 07/18/21 due to Dr. [name] for elective VAD placement.   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 Cyndia Bent under Destination Therapy criteria due to BMI and chronic kidney disease.   Pt sitting up in recliner upon my arrival with his mother in room. Reports he is having surgical chest pain. Received PRN Morphine prior to dressing change.   Pt reports he has been changing his VAD power sources from patient cable to batteries and back again; reports no problems.   Pt currently in afib rate 134; he reports he "felt" rhythm change yesterday at time it occurred.  Vital signs: Temp:  98.5 HR: 134 Doppler:  90 Auto cuff:  106/91 (98) O2 Sat: 97% on Bipap  Wt:  277>292>285>296.3>305.5>305.5 lbs   LVAD interrogation reveals:  Speed: 5900 Flow:  4.9 Power:  4.6w PI: 3.0 Hct: 32  Alarms:  none Events:  rare  Fixed speed:  5900 Low speed limit: 5600  Drive Line: Existing VAD dressing removed and site care performed using sterile technique. Drive line exit site cleaned with Chlora prep applicators x 2, allowed to dry, and gauze dressing with silver strip applied. Exit site with one suture intact, the velour is fully implanted at exit site; small amount dry bloody drainage on silver strip with no redness, tenderness, or foul odor noted. Drive line anchor re-applied x 2. Will continue daily dressing changes per VAD Coordinator, Nurse Davonna Belling, or trained caregiver. Next dressing change due 07/26/21.   Labs:  LDH trend: 318>372>312>302  INR trend: 1.3>1.9>1.9>2.0  Anticoagulation Plan: -INR Goal: 2.0 - 2.5  -ASA Dose: none due to allergy  Blood Products:  - 07/21/21 2 units FFP (intra op)  Device: - [employer] single ICD -Therapies: off   Respiratory: Extubated 07/22/21  Nitric Oxide:  Off 07/22/21  Infection:  - sputum culture 07/23/21>>> specimen rejected - urine culture 07/23/21>>>no growth (final) - blood culture  07/23/21>>>no growth 2 day  VAD Education: Demonstrated dressing change to patient and his mother. Mother is an ICU nurse and already knows sterile technique. She feels comfortable with dressing change  and will be prepared to change next visit.  Discussed modular cable connection to patient and mother. The two cables are connected by aligning the triangles, applying firm force to engage the cables and rotating the locking nut until the clicking sound stops and the yellow line is no longer visible. Keep the connection dry.   Plan/Recommendations:  Call VAD Coordinator if any VAD equipment or drive line issues. Daily dressing changes per VAD Coordinator, Nurse Davonna Belling, or trained caregiver.  Zada Girt RN [place] Coordinator  Office: [phone]  24/7 Pager: [phone]

## 2021-07-25 NOTE — Progress Notes (Signed)
Patient ID: Jeffrey [name], male   DOB: 07-13-1976, 45 y.o.   MRN: [id number] HeartMate 3 Rounding Note  Subjective:    No complaints. Sitting up in chair. Got some sleep.  Had remained in atrial fib/flutter all night in 130's. 140 this am.  Remains on milrinone 0.25, amio drip 30. Co-ox 65% this am.  CVP 12.  +123 cc/24 hrs. Wt is stable from yesterday but 28 lbs over preop. On lasix drip 8 with uo > 100/hr most hrs and creat down to 1.47.   LVAD INTERROGATION:  HeartMate IIl LVAD:  Flow 5.2 liters/min, speed 5900, power 5, PI 1.7.    Objective:    Vital Signs:   Temp:  [98.4 F (36.9 C)-99 F (37.2 C)] 98.8 F (37.1 C) (10/21 0400) Pulse Rate:  [65-180] 146 (10/21 0500) Resp:  [16-33] 19 (10/21 0500) BP: (85-96)/(65-87) 88/80 (10/21 0000) SpO2:  [91 %-98 %] 95 % (10/21 0500) Arterial Line BP: (76-140)/(69-96) 85/76 (10/21 0500) Weight:  [138.6 kg] 138.6 kg (10/21 0500) Last BM Date: 07/18/21 Mean arterial Pressure 80's  Intake/Output:   Intake/Output Summary (Last 24 hours) at 07/25/2021 0731 Last data filed at 07/25/2021 0600 Gross per 24 hour  Intake 2623.17 ml  Output 2500 ml  Net 123.17 ml     Physical Exam: General:  Well appearing. No resp difficulty HEENT: normal Neck: still has left neck line. Cor: Distant heart sounds with LVAD hum present. Lungs: clear Abdomen: soft, nontender, nondistended. Good bowel sounds. Extremities: mild edema Neuro: alert & orientedx3, cranial nerves grossly intact. moves all 4 extremities w/o difficulty. Affect pleasant  Telemetry: atrial fib vs atrial flutter 130's  Labs: Basic Metabolic Panel: Recent Labs  Lab 07/22/21 0306 07/22/21 1205 07/22/21 1651 07/22/21 1700 07/23/21 0424 07/24/21 0350 07/24/21 0401 07/24/21 0449 07/25/21 0323 07/25/21 0436  NA 136   < > 135   < > 131* 133* 134* 135 135 136  K 4.7   < > 4.4   < > 4.9 4.4 4.5 4.5 3.9 3.8  CL 106  --  105  --  101 101  --   --  102  --   CO2 21*  --  22   --  22 24  --   --  26  --   GLUCOSE 119*  --  135*  --  207* 134*  --   --  131*  --   BUN 20  --  18  --  20 30*  --   --  31*  --   CREATININE 1.42*  --  1.55*  --  1.63* 1.66*  --   --  1.47*  --   CALCIUM 9.8  --  10.0  --  9.9 10.6*  --   --  10.2  --   MG 2.4  --  1.9  --  2.2 2.3  --   --  2.2  --   PHOS 3.5  --   --   --  4.7* 5.0*  --   --  3.8  --    < > = values in this interval not displayed.    Liver Function Tests: Recent Labs  Lab 07/20/21 1107 07/22/21 0306 07/23/21 0424 07/24/21 0350  AST 20 60* 45* 26  ALT 42 _0 ALKPHOS 69 56 74 72  BILITOT 0.7 0.8 1.1 0.7  PROT 7.4 6.4* 6.5 6.5  ALBUMIN 3.4* 2.9* 2.5* 2.5*   No results for input(s): LIPASE,  AMYLASE in the last 168 hours. No results for input(s): AMMONIA in the last 168 hours.  CBC: Recent Labs  Lab 07/22/21 0306 07/22/21 1205 07/22/21 1651 07/22/21 1700 07/23/21 0424 07/24/21 0350 07/24/21 0401 07/24/21 0449 07/25/21 0323 07/25/21 0436  WBC 10.4  --  14.4*  --  19.0* 19.4*  --   --  12.8*  --   NEUTROABS 8.5*  --   --   --  15.6* 15.2*  --   --  9.4*  --   HGB 11.2*   < > 11.7*   < > 11.2* 10.6* 11.9* 11.9* 10.1* 10.9*  HCT 35.8*   < > 37.5*   < > 35.7* 34.3* 35.0* 35.0* 32.4* 32.0*  MCV 78.5*  --  78.3*  --  78.1* 79.2*  --   --  77.7*  --   PLT 230  --  223  --  228 225  --   --  235  --    < > = values in this interval not displayed.    INR: Recent Labs  Lab 07/21/21 1408 07/22/21 0306 07/23/21 0614 07/24/21 0350 07/25/21 0323  INR 1.3* 1.3* 1.9* 1.9* 2.0*    Other results: EKG:   Imaging: DG Chest Port 1 View  Result Date: 07/24/2021 CLINICAL DATA:  Check PICC line placement EXAM: PORTABLE CHEST 1 VIEW COMPARISON:  07/24/2021 FINDINGS: New right-sided PICC line is noted in satisfactory position with the catheter tip in the mid superior vena cava. Cardiac shadow is enlarged but stable. Defibrillator and LVAD device are again seen. Postsurgical changes are noted. Left  chest tube is seen and stable. No pneumothorax is noted. Mild vascular congestion is seen. Swan-Ganz catheter has been removed. IMPRESSION: Mild vascular congestion stable from the prior exam. Tubes and lines as described. No pneumothorax is noted. Electronically Signed   By: [name] Catalina M.D.   On: 07/24/2021 22:39   DG CHEST PORT 1 VIEW  Result Date: 07/24/2021 CLINICAL DATA:  Chest tube present s/p LVAD surg,,sore chest today EXAM: PORTABLE CHEST - 1 VIEW COMPARISON:  the previous day's study FINDINGS: Left IJ Swan-Ganz remains in the proximal right pulmonary artery. Left chest tube stable with no pneumothorax. Visualized LVAD components stable. Some increase in bilateral pulmonary vascular congestion. Relatively low lung volumes. Stable cardiomegaly.  Sternotomy wires. No pneumothorax.  No definite pleural effusion. IMPRESSION: Worsening bilateral pulmonary vascular congestion. Support hardware stable. Electronically Signed   By: Lucrezia Europe M.D.   On: 07/24/2021 08:22   [place] EKG SITE RITE  Result Date: 07/24/2021 If Site Rite image not attached, placement could not be confirmed due to current cardiac rhythm.    Medications:     Scheduled Medications:  acetaminophen  1,000 mg Oral Q6H   Or   acetaminophen (TYLENOL) oral liquid 160 mg/5 mL  1,000 mg Per Tube Q6H   amiodarone  150 mg Intravenous Once   amiodarone  150 mg Intravenous Once   bisacodyl  10 mg Oral Daily   Or   bisacodyl  10 mg Rectal Daily   chlorhexidine gluconate (MEDLINE KIT)  15 mL Mouth Rinse BID   Chlorhexidine Gluconate Cloth  6 each Topical Daily   docusate sodium  200 mg Oral Daily   furosemide  40 mg Intravenous Once   insulin aspart  0-20 Units Subcutaneous Q4H   insulin detemir  30 Units Subcutaneous BID   mouth rinse  15 mL Mouth Rinse BID   mupirocin ointment  1 application  Nasal BID   pantoprazole  40 mg Oral Daily   polyethylene glycol  17 g Oral BID   potassium chloride  20 mEq Oral BID   sertraline   25 mg Oral Daily   sodium chloride flush  10-40 mL Intracatheter Q12H   sodium chloride flush  3 mL Intravenous Q12H   traZODone  100 mg Per Tube QHS   warfarin  2.5 mg Oral q1600   Warfarin - Physician Dosing Inpatient   Does not apply q1600    Infusions:  sodium chloride Stopped (07/22/21 2203)   sodium chloride Stopped (07/24/21 1320)   albumin human Stopped (07/21/21 1516)   And   sodium chloride     amiodarone 60 mg/hr (07/25/21 0600)   furosemide (LASIX) 200 mg in dextrose 5% 100 mL (85m/mL) infusion 8 mg/hr (07/25/21 0600)   lactated ringers     meropenem (MERREM) IV 1 g (07/25/21 0600)   milrinone 0.25 mcg/kg/min (07/25/21 0707)   vancomycin Stopped (07/25/21 0039)    PRN Medications: sodium chloride, albumin human **AND** sodium chloride, dextrose, morphine injection, ondansetron (ZOFRAN) IV, oxyCODONE, sodium chloride flush, sodium chloride flush, traMADol   Assessment:   POD 4 s/p HM 3 LVAD for acute on chronic systolic heart failure due to NICM. Mild to moderate RV dysfunction with normal valves.   Stage 3 CKD with solitary kidney. Baseline creat 1.6-2 over the past month but was up to 4.6 at end of July. 1.5 preop.  Hematuria with MRI and cysto ok. He is diuresing with lasix drip and creat down from yesterday which is a good sign. May need to increase lasix to get him negative.   OSA on CPAP.  Oxygenation improved, afebrile and leukocytosis resolving. CXR shows stable mild bibasilar atelectasis and mild pulmonary vascular congestion.  PAF: on amio IV. S/p DCCV in August. Went into AF with RVR yesterday while working with PT. This looks like atrial flutter in the 130's. I tried to rapid atrial pace last night but could not convert. Gave another bolus of amio this am. ? Cardiovert.   Poorly controlled DM preop. His glucose is under good control postop on Levemir and SSI.   Plan/Discussion:    Hemodynamics stable on current VAD speed with CVP 12.   Continue  diuresis   Continue IV amio, may try RAP again or consider DCCV.  DC left pleural tube and pocket drain. DC neck line since PICC is in.  INR stable at 2. Continue Coumadin 2.5 mg daily.  IS, mobilize with PT.  I reviewed the LVAD parameters from today, and compared the results to the patient's prior recorded data.  No programming changes were made.  The LVAD is functioning within specified parameters.   LVAD interrogation was negative for any significant power changes, alarms or PI events/speed drops.  LVAD equipment check completed and is in good working order.    Length of Stay: 7  BGaye Pollack10/21/2022, 7:31 AM

## 2021-07-25 NOTE — Patient Outreach (Signed)
Care Coordination  07/25/2021  DANNY YACKLEY 07/01/1976 [id number]  [name] is currently admitted as an inpatient at [facility] team will follow the progress of CORLEONE BIEGLER and follow up upon discharge.   [name] RN, BSN [place]  [facility] - Managed Medicaid High Risk [phone]

## 2021-07-25 NOTE — Progress Notes (Signed)
CSW attempted to visit patient at bedside although patient was sleeping. No family present at the time of visit. CSW continues to follow. Raquel Sarna, [place], [place]

## 2021-07-25 NOTE — Progress Notes (Signed)
Patient ID: Jeffrey [name], male   DOB: July 09, 1976, 45 y.o.   MRN: [id number] TCTS Evening Rounds:  Hemodynamically stable.  Has remained in atrial fib/flutter 130's with increase to 160's with ambulating. Has received additional amio boluses today and on drip at 60. May need DCCV but will leave that decision up to heart failure team.   Diuresing well on lasix 8. Will check K+ this pm and replete as needed.  Sitting up in chair and looks much better overall.

## 2021-07-25 NOTE — Progress Notes (Signed)
Physical Therapy Treatment Patient Details Name: Jeffrey [name] MRN: [id number] DOB: Dec 04, 1975 Today's Date: 07/25/2021   History of Present Illness Pt is a 45 y.o. male with class IV HF admitted 07/18/21 for pre-VAD optimization; has been relatively stable as outpatient with NYHA 3B symptoms. S/p RHC with swan placement 10/14. S/p HM3 LVAD implant on 10/17. Hypoxia overnight 10/19 requiring bipap. PMH includes poorly controlled HTN, renal cell carcinoma s/p nephrectomy, CKD3, DM2, OSA, gout, morbid obesity, HF (EF<20%).   PT Comments    Pt progressing with mobility. Today's session focused on bed mobility, transfer and gait training; pt limited by c/o significant bilateral knee pain, which pt attributes to gout; HR up 166 with ambulation. Despite pain, pt remains agreeable to participate. Good recall of transitioning LVAD from wall<>battery power with intermittent verbal cues. Will continue to follow acutely to address established goals.    Recommendations for follow up therapy are one component of a multi-disciplinary discharge planning process, led by the attending physician.  Recommendations may be updated based on patient status, additional functional criteria and insurance authorization.  Follow Up Recommendations  Home health PT;Supervision for mobility/OOB     Equipment Recommendations   (TBD - potential rollator)    Recommendations for Other Services       Precautions / Restrictions Precautions Precautions: Fall;Sternal;Other (comment) Precaution Comments: LVAD     Mobility  Bed Mobility Overal bed mobility: Needs Assistance Bed Mobility: Supine to Sit     Supine to sit: Mod assist;HOB elevated     General bed mobility comments: Increased time and effort, modA for BLE management secondary to knee/gout pain, minA for trunk elevation    Transfers Overall transfer level: Needs assistance Equipment used: 4-wheeled walker (eva walker) Transfers: Sit to/from Stand Sit  to Stand: Min assist;+2 safety/equipment         General transfer comment: Pt able to stand with BUE support holding pillow, use of momentum to power into standing, minA for trunk elevation and stability  Ambulation/Gait Ambulation/Gait assistance: Min guard;Min assist;+2 safety/equipment Gait Distance (Feet): 20 Feet Assistive device: 4-wheeled walker (eva walker) Gait Pattern/deviations: Step-to pattern;Step-through pattern;Decreased stride length;Antalgic;Trunk flexed Gait velocity: Decreased   General Gait Details: Slow, antalgic gait with eva walker and intermittent minA for walker management; +2 assist for chair follow; distance limited by bilateral knee pain   Stairs             Wheelchair Mobility    Modified Rankin (Stroke Patients Only)       Balance Overall balance assessment: Needs assistance Sitting-balance support: No upper extremity supported;Feet supported Sitting balance-Leahy Scale: Fair Sitting balance - Comments: able to maintain sitting EOB when switching LVAD from wall to battery power   Standing balance support: No upper extremity supported;During functional activity Standing balance-Leahy Scale: Fair Standing balance comment: Can static stand without UE support, reliant on walker for ambulation                            Cognition Arousal/Alertness: Awake/alert Behavior During Therapy: WFL for tasks assessed/performed Overall Cognitive Status: Within Functional Limits for tasks assessed                                        Exercises      General Comments General comments (skin integrity, edema, etc.): Good recall of switching LVAD from wall<>battery  power with intermittent verbal cues of necessary items, etc. HR up to 166 with activity (RN present and aware)      Pertinent Vitals/Pain Pain Assessment: Faces Faces Pain Scale: Hurts whole lot Pain Location: bilateral knees (pt suspects gout) Pain  Descriptors / Indicators: Discomfort;Guarding;Grimacing;Moaning Pain Intervention(s): Monitored during session;Limited activity within patient's tolerance;Patient requesting pain meds-RN notified    Home Living                      Prior Function            PT Goals (current goals can now be found in the care plan section) Progress towards PT goals: Progressing toward goals    Frequency    Min 3X/week      PT Plan Current plan remains appropriate    Co-evaluation              AM-PAC PT "6 Clicks" Mobility   Outcome Measure  Help needed turning from your back to your side while in a flat bed without using bedrails?: A Little Help needed moving from lying on your back to sitting on the side of a flat bed without using bedrails?: A Lot Help needed moving to and from a bed to a chair (including a wheelchair)?: A Little Help needed standing up from a chair using your arms (e.g., wheelchair or bedside chair)?: A Little Help needed to walk in hospital room?: A Little Help needed climbing 3-5 steps with a railing? : A Lot 6 Click Score: 16    End of Session Equipment Utilized During Treatment: Oxygen Activity Tolerance: Patient tolerated treatment well;Patient limited by pain Patient left: in chair;with call [place]/phone within reach;with nursing/sitter in room Nurse Communication: Mobility status PT Visit Diagnosis: Other abnormalities of gait and mobility (R26.89);Difficulty in walking, not elsewhere classified (R26.2)     Time: 7616-0737 PT Time Calculation (min) (ACUTE ONLY): 42 min  Charges:  $Gait Training: 8-22 mins $Therapeutic Activity: 8-22 mins $Self Care/Home Management: [place], PT, DPT Acute Rehabilitation Services  Pager [phone] Office [place] 07/25/2021, 5:33 PM

## 2021-07-26 DIAGNOSIS — I4891 Unspecified atrial fibrillation: Secondary | ICD-10-CM | POA: Diagnosis not present

## 2021-07-26 DIAGNOSIS — I5022 Chronic systolic (congestive) heart failure: Secondary | ICD-10-CM | POA: Diagnosis not present

## 2021-07-26 DIAGNOSIS — I428 Other cardiomyopathies: Secondary | ICD-10-CM | POA: Diagnosis not present

## 2021-07-26 DIAGNOSIS — Z95811 Presence of heart assist device: Secondary | ICD-10-CM | POA: Diagnosis not present

## 2021-07-26 DIAGNOSIS — I5023 Acute on chronic systolic (congestive) heart failure: Secondary | ICD-10-CM | POA: Diagnosis not present

## 2021-07-26 LAB — BASIC METABOLIC PANEL
Anion gap: 9 (ref 5–15)
Anion gap: 8 (ref 5–15)
BUN: 31 mg/dL — ABNORMAL HIGH (ref 6–20)
BUN: 32 mg/dL — ABNORMAL HIGH (ref 6–20)
CO2: 27 mmol/L (ref 22–32)
CO2: 26 mmol/L (ref 22–32)
Calcium: 10.3 mg/dL (ref 8.9–10.3)
Calcium: 9.5 mg/dL (ref 8.9–10.3)
Chloride: 99 mmol/L (ref 98–111)
Chloride: 96 mmol/L — ABNORMAL LOW (ref 98–111)
Creatinine, Ser: 1.39 mg/dL — ABNORMAL HIGH (ref 0.61–1.24)
Creatinine, Ser: 1.36 mg/dL — ABNORMAL HIGH (ref 0.61–1.24)
GFR, Estimated: >60 mL/min (ref >60)
GFR, Estimated: >60 mL/min (ref >60)
Glucose, Bld: 137 mg/dL — ABNORMAL HIGH (ref 70–99)
Glucose, Bld: 322 mg/dL — ABNORMAL HIGH (ref 70–99)
Potassium: 3.2 mmol/L — ABNORMAL LOW (ref 3.5–5.1)
Potassium: 3.8 mmol/L (ref 3.5–5.1)
Sodium: 135 mmol/L (ref 135–145)
Sodium: 130 mmol/L — ABNORMAL LOW (ref 135–145)

## 2021-07-26 LAB — CBC WITH DIFFERENTIAL/PLATELET
Abs Immature Granulocytes: 0.12 10*3/uL — ABNORMAL HIGH (ref 0.00–0.07)
Basophils Absolute: 0.1 10*3/uL (ref 0.0–0.1)
Basophils Relative: 1 %
Eosinophils Absolute: 0.3 10*3/uL (ref 0.0–0.5)
Eosinophils Relative: 2 %
HCT: 32 % — ABNORMAL LOW (ref 39.0–52.0)
Hemoglobin: 10 g/dL — ABNORMAL LOW (ref 13.0–17.0)
Immature Granulocytes: 1 %
Lymphocytes Relative: 14 %
Lymphs Abs: 1.7 10*3/uL (ref 0.7–4.0)
MCH: 23.8 pg — ABNORMAL LOW (ref 26.0–34.0)
MCHC: 31.3 g/dL (ref 30.0–36.0)
MCV: 76.2 fL — ABNORMAL LOW (ref 80.0–100.0)
Monocytes Absolute: 2 10*3/uL — ABNORMAL HIGH (ref 0.1–1.0)
Monocytes Relative: 17 %
Neutro Abs: 7.5 10*3/uL (ref 1.7–7.7)
Neutrophils Relative %: 65 %
Platelets: 309 10*3/uL (ref 150–400)
RBC: 4.2 MIL/uL — ABNORMAL LOW (ref 4.22–5.81)
RDW: 16.2 % — ABNORMAL HIGH (ref 11.5–15.5)
WBC: 11.6 10*3/uL — ABNORMAL HIGH (ref 4.0–10.5)
nRBC: 0.9 % — ABNORMAL HIGH (ref 0.0–0.2)

## 2021-07-26 LAB — COOXEMETRY PANEL
Carboxyhemoglobin: 1.1 % (ref 0.5–1.5)
Methemoglobin: 0.8 % (ref 0.0–1.5)
O2 Saturation: 54.4 %
Total hemoglobin: 11.7 g/dL — ABNORMAL LOW (ref 12.0–16.0)

## 2021-07-26 LAB — GLUCOSE, CAPILLARY
Glucose-Capillary: 126 mg/dL — ABNORMAL HIGH (ref 70–99)
Glucose-Capillary: 100 mg/dL — ABNORMAL HIGH (ref 70–99)
Glucose-Capillary: 92 mg/dL (ref 70–99)
Glucose-Capillary: 110 mg/dL — ABNORMAL HIGH (ref 70–99)
Glucose-Capillary: 169 mg/dL — ABNORMAL HIGH (ref 70–99)
Glucose-Capillary: 173 mg/dL — ABNORMAL HIGH (ref 70–99)

## 2021-07-26 LAB — PHOSPHORUS: Phosphorus: 3.4 mg/dL (ref 2.5–4.6)

## 2021-07-26 LAB — PROTIME-INR
INR: 3.3 — ABNORMAL HIGH (ref 0.8–1.2)
Prothrombin Time: 33.7 seconds — ABNORMAL HIGH (ref 11.4–15.2)

## 2021-07-26 LAB — MAGNESIUM: Magnesium: 1.9 mg/dL (ref 1.7–2.4)

## 2021-07-26 LAB — LACTATE DEHYDROGENASE: LDH: 245 U/L — ABNORMAL HIGH (ref 98–192)

## 2021-07-26 MED ORDER — PREDNISONE 20 MG PO TABS
40.0000 mg | ORAL_TABLET | Freq: Every day | ORAL | Status: AC
  Administered 2021-07-26 – 2021-07-27 (×2): 40 mg via ORAL
  Filled 2021-07-26 (×2): qty 2

## 2021-07-26 MED ORDER — POTASSIUM CHLORIDE CRYS ER 20 MEQ PO TBCR
40.0000 meq | EXTENDED_RELEASE_TABLET | Freq: Once | ORAL | Status: AC
  Administered 2021-07-26: 40 meq via ORAL
  Filled 2021-07-26: qty 2

## 2021-07-26 MED ORDER — MAGNESIUM SULFATE 2 GM/50ML IV SOLN
2.0000 g | Freq: Once | INTRAVENOUS | Status: AC
  Administered 2021-07-26: 2 g via INTRAVENOUS
  Filled 2021-07-26: qty 50

## 2021-07-26 MED ORDER — METOLAZONE 2.5 MG PO TABS
2.5000 mg | ORAL_TABLET | Freq: Once | ORAL | Status: AC
  Administered 2021-07-26: 2.5 mg via ORAL
  Filled 2021-07-26: qty 1

## 2021-07-26 MED ORDER — SORBITOL 70 % SOLN
30.0000 mL | Freq: Once | Status: AC
  Administered 2021-07-26: 30 mL via ORAL
  Filled 2021-07-26: qty 30

## 2021-07-26 MED ORDER — POTASSIUM CHLORIDE CRYS ER 20 MEQ PO TBCR
40.0000 meq | EXTENDED_RELEASE_TABLET | ORAL | Status: AC
Start: 2021-07-26 — End: 2021-07-26
  Administered 2021-07-26 (×3): 40 meq via ORAL
  Filled 2021-07-26 (×3): qty 2

## 2021-07-26 MED ORDER — ORAL CARE MOUTH RINSE
15.0000 mL | Freq: Two times a day (BID) | OROMUCOSAL | Status: DC
  Administered 2021-07-27 – 2021-08-06 (×12): 15 mL via OROMUCOSAL

## 2021-07-26 MED ORDER — WARFARIN SODIUM 1 MG PO TABS: 1.0000 mg | ORAL_TABLET | Freq: Every day | ORAL | Status: DC

## 2021-07-26 NOTE — Progress Notes (Signed)
Patient ID: Jeffrey [name], male   DOB: 02-16-1976, 45 y.o.   MRN: [id number] TCTS Evening Rounds:  Hemodynamically stable.   MAP has been 90-100 today.   Remains in rapid AF. I tried to rapid atrial pace again but could not capture. Plan DCCV in am if no change.  Good urine output on lasix 15 but only -561 today. Will give a dose of metolazone this pm. Replace K+.  His gout pain is better this pm.

## 2021-07-26 NOTE — Progress Notes (Signed)
Patient ID: Jeffrey [name], male   DOB: 12-19-1975, 45 y.o.   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 Receiving some steroids and colchicine.  Remains on milrinone 0.25 with Co-ox 54%   CVP 12 Diuresing with lasix drip 15, -1662 cc yesterday. Wt still up 28 lbs from preop.   LVAD INTERROGATION:  HeartMate IIl LVAD:  Flow 4.8 liters/min, speed 5900, power 5, PI 3.2.    Objective:    Vital Signs:   Temp:  [98.5 F (36.9 C)-99.2 F (37.3 C)] 98.6 F (37 C) (10/22 0814) Pulse Rate:  [109-187] 150 (10/22 1100) Resp:  [13-43] 18 (10/22 1100) BP: (106-127)/(91-102) 127/102 (10/22 1100) SpO2:  [83 %-98 %] 94 % (10/22 1100) Weight:  [137.7 kg] 137.7 kg (10/22 0354) Last BM Date: 07/18/21 Mean arterial Pressure 105  Intake/Output:   Intake/Output Summary (Last 24 hours) at 07/26/2021 1115 Last data filed at 07/26/2021 1100 Gross per 24 hour  Intake 2394.54 ml  Output 4780 ml  Net -2385.46 ml     Physical Exam: General:  Well appearing. No resp difficulty HEENT: normal Cor: Distant heart sounds with LVAD hum present. Lungs: clear Abdomen: soft, nontender, nondistended. Good bowel sounds. Extremities: mild edema Neuro: alert & orientedx3, cranial nerves grossly intact. moves all 4 extremities w/o difficulty. Affect pleasant  Telemetry: Atrial fib/flutter 130's  Labs: Basic Metabolic Panel: Recent Labs  Lab 07/22/21 0306 07/22/21 1205 07/22/21 1651 07/22/21 1700 07/23/21 0424 07/24/21 0350 07/24/21 0401 07/24/21 0449 07/25/21 0323 07/25/21 0436 07/25/21 2016 07/26/21 0344  NA 136   < > 135   < > 131* 133*   < > 135 135 136 132* 135  K 4.7   < > 4.4   < > 4.9 4.4   < > 4.5 3.9 3.8 3.5 3.2*  CL 106  --  105  --  101 101  --   --  102  --  96* 99  CO2 21*  --  22  --  22 24  --   --  26  --  24 27  GLUCOSE 119*  --  135*  --  207*  134*  --   --  131*  --  193* 137*  BUN 20  --  18  --  20 30*  --   --  31*  --  28* 31*  CREATININE 1.42*  --  1.55*  --  1.63* 1.66*  --   --  1.47*  --  1.45* 1.39*  CALCIUM 9.8  --  10.0  --  9.9 10.6*  --   --  10.2  --  9.9 10.3  MG 2.4  --  1.9  --  2.2 2.3  --   --  2.2  --   --  1.9  PHOS 3.5  --   --   --  4.7* 5.0*  --   --  3.8  --   --  3.4   < > = values in this interval not displayed.    Liver Function Tests: Recent Labs  Lab 07/20/21 1107 07/22/21 0306 07/23/21 0424 07/24/21 0350  AST 20 60* 45* 26  ALT 42 _0 ALKPHOS 69 56 74 72  BILITOT 0.7 0.8 1.1 0.7  PROT 7.4 6.4* 6.5 6.5  ALBUMIN 3.4* 2.9* 2.5*  2.5*   No results for input(s): LIPASE, AMYLASE in the last 168 hours. No results for input(s): AMMONIA in the last 168 hours.  CBC: Recent Labs  Lab 07/22/21 0306 07/22/21 1205 07/22/21 1651 07/22/21 1700 07/23/21 0424 07/24/21 0350 07/24/21 0401 07/24/21 0449 07/25/21 0323 07/25/21 0436 07/26/21 0344  WBC 10.4  --  14.4*  --  19.0* 19.4*  --   --  12.8*  --  11.6*  NEUTROABS 8.5*  --   --   --  15.6* 15.2*  --   --  9.4*  --  7.5  HGB 11.2*   < > 11.7*   < > 11.2* 10.6* 11.9* 11.9* 10.1* 10.9* 10.0*  HCT 35.8*   < > 37.5*   < > 35.7* 34.3* 35.0* 35.0* 32.4* 32.0* 32.0*  MCV 78.5*  --  78.3*  --  78.1* 79.2*  --   --  77.7*  --  76.2*  PLT 230  --  223  --  228 225  --   --  235  --  309   < > = values in this interval not displayed.    INR: Recent Labs  Lab 07/22/21 0306 07/23/21 0614 07/24/21 0350 07/25/21 0323 07/26/21 0344  INR 1.3* 1.9* 1.9* 2.0* 3.3*    Other results: EKG:   Imaging: DG CHEST PORT 1 VIEW  Result Date: 07/25/2021 CLINICAL DATA:  Chest tube present status post open heart surgery, LVAD, shortness of breath EXAM: PORTABLE CHEST 1 VIEW COMPARISON:  Chest radiograph 1 day prior FINDINGS: The right upper extremity PICC terminating in the mid SVC is unchanged. The LVAD, left chest tube, defibrillator, and median  sternotomy wires are stable. The cardiomediastinal silhouette is stable, with unchanged cardiomegaly. There is mild pulmonary interstitial edema, not significantly changed. There is no significant or enlarging pleural effusion. There is no pneumothorax. The bones are stable. IMPRESSION: Mild pulmonary interstitial edema, not significantly changed. No new or worsening airspace disease. Electronically Signed   By: Valetta Mole M.D.   On: 07/25/2021 08:23   DG Chest Port 1 View  Result Date: 07/24/2021 CLINICAL DATA:  Check PICC line placement EXAM: PORTABLE CHEST 1 VIEW COMPARISON:  07/24/2021 FINDINGS: New right-sided PICC line is noted in satisfactory position with the catheter tip in the mid superior vena cava. Cardiac shadow is enlarged but stable. Defibrillator and LVAD device are again seen. Postsurgical changes are noted. Left chest tube is seen and stable. No pneumothorax is noted. Mild vascular congestion is seen. Swan-Ganz catheter has been removed. IMPRESSION: Mild vascular congestion stable from the prior exam. Tubes and lines as described. No pneumothorax is noted. Electronically Signed   By: [name] Catalina M.D.   On: 07/24/2021 22:39     Medications:     Scheduled Medications:  acetaminophen  1,000 mg Oral Q6H   Or   acetaminophen (TYLENOL) oral liquid 160 mg/5 mL  1,000 mg Per Tube Q6H   alteplase  2 mg Intracatheter Once   amiodarone  150 mg Intravenous Once   bisacodyl  10 mg Oral Daily   Or   bisacodyl  10 mg Rectal Daily   chlorhexidine gluconate (MEDLINE KIT)  15 mL Mouth Rinse BID   Chlorhexidine Gluconate Cloth  6 each Topical Daily   colchicine  0.6 mg Oral Daily   docusate sodium  200 mg Oral Daily   insulin aspart  0-20 Units Subcutaneous Q4H   insulin detemir  30 Units Subcutaneous BID   mouth rinse  15 mL Mouth Rinse BID   pantoprazole  40 mg Oral Daily   polyethylene glycol  17 g Oral BID   potassium chloride  40 mEq Oral Q2H   predniSONE  40 mg Oral Q breakfast    sertraline  25 mg Oral Daily   sodium chloride flush  10-40 mL Intracatheter Q12H   sodium chloride flush  3 mL Intravenous Q12H   traZODone  100 mg Per Tube QHS   warfarin  1 mg Oral q1600   Warfarin - Physician Dosing Inpatient   Does not apply q1600    Infusions:  sodium chloride Stopped (07/22/21 2203)   sodium chloride Stopped (07/24/21 1320)   albumin human Stopped (07/21/21 1516)   And   sodium chloride     amiodarone 60 mg/hr (07/26/21 1100)   furosemide (LASIX) 200 mg in dextrose 5% 100 mL (23m/mL) infusion 15 mg/hr (07/26/21 1100)   lactated ringers     meropenem (MERREM) IV Stopped (07/26/21 0722)   milrinone 0.25 mcg/kg/min (07/26/21 1100)   vancomycin Stopped (07/26/21 0131)    PRN Medications: sodium chloride, albumin human **AND** sodium chloride, dextrose, morphine injection, ondansetron (ZOFRAN) IV, oxyCODONE, sodium chloride flush, sodium chloride flush, traMADol   Assessment/Plan/Discussion:    POD 5 s/p HM 3 LVAD for acute on chronic systolic heart failure due to NICM. Mild to moderate RV dysfunction with normal valves.   Stage 3 CKD with solitary kidney. Baseline creat 1.6-2 over the past month but was up to 4.6 at end of July. 1.5 preop.  Hematuria with MRI and cysto ok. He is diuresing with lasix drip and creat down to 1.39.    OSA on CPAP.  Oxygenation improved, afebrile and leukocytosis resolving.   Started on vanc and meropenem Wednesday due to leukocytosis and low grade fever. I would be very careful with vanc since he only has one kidney that is abnormal.   PAF: on amio IV. S/p DCCV in August. Remains in AF with RVR  This looks like atrial flutter in the 130's. Could not be rapid atrial paced. Plan DCCV tomorrow if no change. NPO after MN.   Poorly controlled DM preop. His glucose is under good control postop on Levemir and SSI.   Polyarticular gout flare: brief steroids and colchicine.  INR bumped to 3.3. Will hold Coumadin tonight.    Continue IS, mobilization as tolerated.  I reviewed the LVAD parameters from today, and compared the results to the patient's prior recorded data.  No programming changes were made.  The LVAD is functioning within specified parameters.    Length of Stay: 8  BGaye Pollack10/22/2022, 11:15 AM

## 2021-07-26 NOTE — Plan of Care (Signed)

## 2021-07-26 NOTE — Progress Notes (Signed)
Pt placed on home CPAP with home tubing and nasal mask and 5L O2 bled in. RT will continue to monitor.

## 2021-07-26 NOTE — Progress Notes (Signed)
Advanced Heart Failure VAD Team Note  PCP-Cardiologist: Glori Bickers, MD   Subjective:    10/17: HM3 LVAD implant (DT)  10/18: Extubated   On milrinone at 0.25. co-ox 54%  On lasix gtt at 15  Diuresing well. Weight down 2 pounds.   Remains in AF/AFL 130-150. On amio gtt. Failed RAP.   C/o severe polyarticular gout pain. No SOB, orthopnea or PND  VAD speed increased to 5900   LVAD INTERROGATION:  HeartMate III LVAD:   Flow 4.8 liters/min, speed 5900, power 4.6  PI 3.1 1   Objective:    Vital Signs:   Temp:  [98.5 F (36.9 C)-99.2 F (37.3 C)] 99.2 F (37.3 C) (10/21 2300) Pulse Rate:  [94-192] 139 (10/22 0600) Resp:  [13-43] 25 (10/22 0600) BP: (106-113)/(91-92) 113/91 (10/21 2336) SpO2:  [83 %-98 %] 93 % (10/22 0600) Arterial Line BP: (79-97)/(71-85) 97/85 (10/21 1000) Weight:  [137.7 kg] 137.7 kg (10/22 0354) Last BM Date: 07/18/21 Mean arterial Pressure 90s  Intake/Output:   Intake/Output Summary (Last 24 hours) at 07/26/2021 3748 Last data filed at 07/26/2021 0600 Gross per 24 hour  Intake 2803.01 ml  Output 4465 ml  Net -1661.99 ml      Physical Exam    General:  In bed. Uncomfortable due to gout  HEENT: normal  Neck: supple. JVP not elevated.  Carotids 2+ bilat; no bruits. No lymphadenopathy or thryomegaly appreciated. Cor: sternal wound ok  LVAD hum.  Lungs: Clear. Abdomen: obese soft, nontender, non-distended. No hepatosplenomegaly. No bruits or masses. Good bowel sounds. Driveline site clean. Anchor in place.  Extremities: no cyanosis, clubbing, rash. Warm 1+ edema  Neuro: alert & oriented x 3. No focal deficits. Moves all 4 without problem     Telemetry   AF 130- 150 Personally reviewed   Labs   Basic Metabolic Panel: Recent Labs  Lab 07/22/21 0306 07/22/21 1205 07/22/21 1651 07/22/21 1700 07/23/21 0424 07/24/21 0350 07/24/21 0401 07/24/21 0449 07/25/21 0323 07/25/21 0436 07/25/21 2016 07/26/21 0344  NA 136   < > 135    < > 131* 133*   < > 135 135 136 132* 135  K 4.7   < > 4.4   < > 4.9 4.4   < > 4.5 3.9 3.8 3.5 3.2*  CL 106  --  105  --  101 101  --   --  102  --  96* 99  CO2 21*  --  22  --  22 24  --   --  26  --  24 27  GLUCOSE 119*  --  135*  --  207* 134*  --   --  131*  --  193* 137*  BUN 20  --  18  --  20 30*  --   --  31*  --  28* 31*  CREATININE 1.42*  --  1.55*  --  1.63* 1.66*  --   --  1.47*  --  1.45* 1.39*  CALCIUM 9.8  --  10.0  --  9.9 10.6*  --   --  10.2  --  9.9 10.3  MG 2.4  --  1.9  --  2.2 2.3  --   --  2.2  --   --  1.9  PHOS 3.5  --   --   --  4.7* 5.0*  --   --  3.8  --   --  3.4   < > = values in this interval not  displayed.     Liver Function Tests: Recent Labs  Lab 07/20/21 1107 07/22/21 0306 07/23/21 0424 07/24/21 0350  AST 20 60* 45* 26  ALT 42 '29 19 13  ' ALKPHOS 69 56 74 72  BILITOT 0.7 0.8 1.1 0.7  PROT 7.4 6.4* 6.5 6.5  ALBUMIN 3.4* 2.9* 2.5* 2.5*    No results for input(s): LIPASE, AMYLASE in the last 168 hours. No results for input(s): AMMONIA in the last 168 hours.  CBC: Recent Labs  Lab 07/22/21 0306 07/22/21 1205 07/22/21 1651 07/22/21 1700 07/23/21 0424 07/24/21 0350 07/24/21 0401 07/24/21 0449 07/25/21 0323 07/25/21 0436 07/26/21 0344  WBC 10.4  --  14.4*  --  19.0* 19.4*  --   --  12.8*  --  11.6*  NEUTROABS 8.5*  --   --   --  15.6* 15.2*  --   --  9.4*  --  7.5  HGB 11.2*   < > 11.7*   < > 11.2* 10.6* 11.9* 11.9* 10.1* 10.9* 10.0*  HCT 35.8*   < > 37.5*   < > 35.7* 34.3* 35.0* 35.0* 32.4* 32.0* 32.0*  MCV 78.5*  --  78.3*  --  78.1* 79.2*  --   --  77.7*  --  76.2*  PLT 230  --  223  --  228 225  --   --  235  --  309   < > = values in this interval not displayed.     INR: Recent Labs  Lab 07/22/21 0306 07/23/21 0614 07/24/21 0350 07/25/21 0323 07/26/21 0344  INR 1.3* 1.9* 1.9* 2.0* 3.3*     Other results: EKG:    Imaging   DG CHEST PORT 1 VIEW  Result Date: 07/25/2021 CLINICAL DATA:  Chest tube present status  post open heart surgery, LVAD, shortness of breath EXAM: PORTABLE CHEST 1 VIEW COMPARISON:  Chest radiograph 1 day prior FINDINGS: The right upper extremity PICC terminating in the mid SVC is unchanged. The LVAD, left chest tube, defibrillator, and median sternotomy wires are stable. The cardiomediastinal silhouette is stable, with unchanged cardiomegaly. There is mild pulmonary interstitial edema, not significantly changed. There is no significant or enlarging pleural effusion. There is no pneumothorax. The bones are stable. IMPRESSION: Mild pulmonary interstitial edema, not significantly changed. No new or worsening airspace disease. Electronically Signed   By: Valetta Mole M.D.   On: 07/25/2021 08:23   DG Chest Port 1 View  Result Date: 07/24/2021 CLINICAL DATA:  Check PICC line placement EXAM: PORTABLE CHEST 1 VIEW COMPARISON:  07/24/2021 FINDINGS: New right-sided PICC line is noted in satisfactory position with the catheter tip in the mid superior vena cava. Cardiac shadow is enlarged but stable. Defibrillator and LVAD device are again seen. Postsurgical changes are noted. Left chest tube is seen and stable. No pneumothorax is noted. Mild vascular congestion is seen. Swan-Ganz catheter has been removed. IMPRESSION: Mild vascular congestion stable from the prior exam. Tubes and lines as described. No pneumothorax is noted. Electronically Signed   By: [name] Catalina M.D.   On: 07/24/2021 22:39   [place] EKG SITE RITE  Result Date: 07/24/2021 If Site Rite image not attached, placement could not be confirmed due to current cardiac rhythm.    Medications:     Scheduled Medications:  acetaminophen  1,000 mg Oral Q6H   Or   acetaminophen (TYLENOL) oral liquid 160 mg/5 mL  1,000 mg Per Tube Q6H   alteplase  2 mg Intracatheter Once  amiodarone  150 mg Intravenous Once   bisacodyl  10 mg Oral Daily   Or   bisacodyl  10 mg Rectal Daily   chlorhexidine gluconate (MEDLINE KIT)  15 mL Mouth Rinse BID    Chlorhexidine Gluconate Cloth  6 each Topical Daily   colchicine  0.6 mg Oral Daily   docusate sodium  200 mg Oral Daily   furosemide  40 mg Intravenous Once   insulin aspart  0-20 Units Subcutaneous Q4H   insulin detemir  30 Units Subcutaneous BID   mouth rinse  15 mL Mouth Rinse BID   pantoprazole  40 mg Oral Daily   polyethylene glycol  17 g Oral BID   potassium chloride  20 mEq Oral Q4H   potassium chloride  40 mEq Oral Q2H   predniSONE  40 mg Oral Q breakfast   sertraline  25 mg Oral Daily   sodium chloride flush  10-40 mL Intracatheter Q12H   sodium chloride flush  3 mL Intravenous Q12H   traZODone  100 mg Per Tube QHS   warfarin  2.5 mg Oral q1600   Warfarin - Physician Dosing Inpatient   Does not apply q1600    Infusions:  sodium chloride Stopped (07/22/21 2203)   sodium chloride Stopped (07/24/21 1320)   albumin human Stopped (07/21/21 1516)   And   sodium chloride     amiodarone Stopped (07/26/21 0558)   furosemide (LASIX) 200 mg in dextrose 5% 100 mL (10m/mL) infusion 15 mg/hr (07/26/21 0600)   lactated ringers     meropenem (MERREM) IV 1 g (07/26/21 0601)   milrinone Stopped (07/26/21 0558)   vancomycin Stopped (07/26/21 0131)    PRN Medications: sodium chloride, albumin human **AND** sodium chloride, dextrose, morphine injection, ondansetron (ZOFRAN) IV, oxyCODONE, sodium chloride flush, sodium chloride flush, traMADol   Assessment/Plan:    1. Acute on Chronic systolic CHF - Nonischemic CM, end-stage - Echo (9/21): EF 20-25%.   - Echo  8/22: EF < 20%, severe LV dilation, moderate RV dysfunction.   - RHC (8/22): well compensated hemodynamics. - s/p S-ICD - NYHA IV  - Body mass index is 45.42 kg/m. Not transplant candidate - S/p HM3 LVAD 10/17 (DT) - On milrinone 0.25. Co-ox 65% - Remains volume overloaded, CVP 12 Wt still up > 20 pounds - Continue lasix gtt at 15. Supp K  Can repeat metolazone 2.5 as needed - Continue milrinone for RV support until  better diuresed  2. VAD  - s/p HM-3 placement 10/17 - LDH 245 - MAPs high in setting of pain - VAD interrogated personally. Parameters stable. - Ramp echo on Monday - INR 3.3. continue to hold warfarin  3 Atrial fibrillation: Paroxysmal - Had severe AF with RVR 7/22 s/p DC-CV x 2 - Seen by EP. Not a candidate for ablation due to body habitus  (could consider AVN ablation and CRT, if needed)  - Back in AF with RVR post VAD - Continue IV amio. Will bolus more today - Keep NPO overnight for possible DC-CV tomorrow - warfarin per CT surgery    4. CKD stage 3B  - Has solitary kidney.   - Baseline creatinine is 1.6-2.9.  - SCr 1.39 today, monitor closely    5. OSA - Continue CPAP   6. DM2. Poorly controlled - On Jardiance. - Hgb A1c 9.4. - Cover with SSI   7. Hematuria - MRI and cystoscopy ok   8. 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  No programming changes were made.  The LVAD is functioning within specified parameters.  The patient performs LVAD self-test daily.  LVAD interrogation was negative for any significant power changes, alarms or PI events/speed drops.  LVAD equipment check completed and is in good working order.  Back-up equipment present.   LVAD education done on emergency procedures and precautions and reviewed exit site care.  Length of Stay: [place], [place] 07/26/2021, 6:32 AM  VAD Team --- VAD ISSUES ONLY--- Pager [phone] (7am - 7am)  Advanced Heart Failure Team  Pager [phone] (M-F; 7a - 5p)  Please contact [facility] for night-coverage after hours (5p -7a ) and weekends on [email or web address]

## 2021-07-27 ENCOUNTER — Inpatient Hospital Stay (HOSPITAL_COMMUNITY): Payer: Medicaid Other | Admitting: Certified Registered"

## 2021-07-27 ENCOUNTER — Encounter (HOSPITAL_COMMUNITY)
Admission: RE | Disposition: A | Discharge status: Home Health | Payer: Self-pay | Source: Home / Self Care | Attending: Internal Medicine

## 2021-07-27 DIAGNOSIS — D72829 Elevated white blood cell count, unspecified: Secondary | ICD-10-CM | POA: Diagnosis not present

## 2021-07-27 DIAGNOSIS — Z794 Long term (current) use of insulin: Secondary | ICD-10-CM | POA: Diagnosis not present

## 2021-07-27 DIAGNOSIS — Z20822 Contact with and (suspected) exposure to covid-19: Secondary | ICD-10-CM | POA: Diagnosis not present

## 2021-07-27 DIAGNOSIS — I5023 Acute on chronic systolic (congestive) heart failure: Secondary | ICD-10-CM | POA: Diagnosis not present

## 2021-07-27 DIAGNOSIS — E1165 Type 2 diabetes mellitus with hyperglycemia: Secondary | ICD-10-CM | POA: Diagnosis not present

## 2021-07-27 DIAGNOSIS — I428 Other cardiomyopathies: Secondary | ICD-10-CM | POA: Diagnosis not present

## 2021-07-27 DIAGNOSIS — I5022 Chronic systolic (congestive) heart failure: Secondary | ICD-10-CM | POA: Diagnosis not present

## 2021-07-27 DIAGNOSIS — Z6841 Body Mass Index (BMI) 40.0 and over, adult: Secondary | ICD-10-CM | POA: Diagnosis not present

## 2021-07-27 DIAGNOSIS — E1122 Type 2 diabetes mellitus with diabetic chronic kidney disease: Secondary | ICD-10-CM | POA: Diagnosis not present

## 2021-07-27 DIAGNOSIS — N1832 Chronic kidney disease, stage 3b: Secondary | ICD-10-CM | POA: Diagnosis not present

## 2021-07-27 DIAGNOSIS — I272 Pulmonary hypertension, unspecified: Secondary | ICD-10-CM | POA: Diagnosis not present

## 2021-07-27 DIAGNOSIS — N183 Chronic kidney disease, stage 3 unspecified: Secondary | ICD-10-CM | POA: Diagnosis not present

## 2021-07-27 DIAGNOSIS — I130 Hypertensive heart and chronic kidney disease with heart failure and stage 1 through stage 4 chronic kidney disease, or unspecified chronic kidney disease: Secondary | ICD-10-CM | POA: Diagnosis not present

## 2021-07-27 DIAGNOSIS — E872 Acidosis, unspecified: Secondary | ICD-10-CM | POA: Diagnosis not present

## 2021-07-27 DIAGNOSIS — I4891 Unspecified atrial fibrillation: Secondary | ICD-10-CM | POA: Diagnosis not present

## 2021-07-27 DIAGNOSIS — I480 Paroxysmal atrial fibrillation: Secondary | ICD-10-CM | POA: Diagnosis not present

## 2021-07-27 DIAGNOSIS — Z95811 Presence of heart assist device: Secondary | ICD-10-CM | POA: Diagnosis not present

## 2021-07-27 HISTORY — PX: CARDIOVERSION: SHX1299

## 2021-07-27 LAB — CBC WITH DIFFERENTIAL/PLATELET
Abs Immature Granulocytes: 0.12 10*3/uL — ABNORMAL HIGH (ref 0.00–0.07)
Basophils Absolute: 0.1 10*3/uL (ref 0.0–0.1)
Basophils Relative: 0 %
Eosinophils Absolute: 0.1 10*3/uL (ref 0.0–0.5)
Eosinophils Relative: 1 %
HCT: 31.3 % — ABNORMAL LOW (ref 39.0–52.0)
Hemoglobin: 9.9 g/dL — ABNORMAL LOW (ref 13.0–17.0)
Immature Granulocytes: 1 %
Lymphocytes Relative: 15 %
Lymphs Abs: 2 10*3/uL (ref 0.7–4.0)
MCH: 23.9 pg — ABNORMAL LOW (ref 26.0–34.0)
MCHC: 31.6 g/dL (ref 30.0–36.0)
MCV: 75.4 fL — ABNORMAL LOW (ref 80.0–100.0)
Monocytes Absolute: 2.3 10*3/uL — ABNORMAL HIGH (ref 0.1–1.0)
Monocytes Relative: 18 %
Neutro Abs: 8.5 10*3/uL — ABNORMAL HIGH (ref 1.7–7.7)
Neutrophils Relative %: 65 %
Platelets: 391 10*3/uL (ref 150–400)
RBC: 4.15 MIL/uL — ABNORMAL LOW (ref 4.22–5.81)
RDW: 16.1 % — ABNORMAL HIGH (ref 11.5–15.5)
WBC: 13.1 10*3/uL — ABNORMAL HIGH (ref 4.0–10.5)
nRBC: 1.5 % — ABNORMAL HIGH (ref 0.0–0.2)

## 2021-07-27 LAB — BASIC METABOLIC PANEL
Anion gap: 9 (ref 5–15)
Anion gap: 8 (ref 5–15)
BUN: 34 mg/dL — ABNORMAL HIGH (ref 6–20)
BUN: 38 mg/dL — ABNORMAL HIGH (ref 6–20)
CO2: 28 mmol/L (ref 22–32)
CO2: 27 mmol/L (ref 22–32)
Calcium: 10.3 mg/dL (ref 8.9–10.3)
Calcium: 10.1 mg/dL (ref 8.9–10.3)
Chloride: 98 mmol/L (ref 98–111)
Chloride: 96 mmol/L — ABNORMAL LOW (ref 98–111)
Creatinine, Ser: 1.47 mg/dL — ABNORMAL HIGH (ref 0.61–1.24)
Creatinine, Ser: 1.68 mg/dL — ABNORMAL HIGH (ref 0.61–1.24)
GFR, Estimated: 59 mL/min — ABNORMAL LOW (ref >60)
GFR, Estimated: 50 mL/min — ABNORMAL LOW (ref >60)
Glucose, Bld: 131 mg/dL — ABNORMAL HIGH (ref 70–99)
Glucose, Bld: 291 mg/dL — ABNORMAL HIGH (ref 70–99)
Potassium: 3.5 mmol/L (ref 3.5–5.1)
Potassium: 4.1 mmol/L (ref 3.5–5.1)
Sodium: 135 mmol/L (ref 135–145)
Sodium: 131 mmol/L — ABNORMAL LOW (ref 135–145)

## 2021-07-27 LAB — GLUCOSE, CAPILLARY
Glucose-Capillary: 132 mg/dL — ABNORMAL HIGH (ref 70–99)
Glucose-Capillary: 141 mg/dL — ABNORMAL HIGH (ref 70–99)
Glucose-Capillary: 148 mg/dL — ABNORMAL HIGH (ref 70–99)
Glucose-Capillary: 148 mg/dL — ABNORMAL HIGH (ref 70–99)
Glucose-Capillary: 149 mg/dL — ABNORMAL HIGH (ref 70–99)
Glucose-Capillary: 155 mg/dL — ABNORMAL HIGH (ref 70–99)
Glucose-Capillary: 163 mg/dL — ABNORMAL HIGH (ref 70–99)

## 2021-07-27 LAB — COOXEMETRY PANEL
Carboxyhemoglobin: 1.2 % (ref 0.5–1.5)
Methemoglobin: 0.9 % (ref 0.0–1.5)
O2 Saturation: 66.2 %
Total hemoglobin: 10 g/dL — ABNORMAL LOW (ref 12.0–16.0)

## 2021-07-27 LAB — PROTIME-INR
INR: 2.9 — ABNORMAL HIGH (ref 0.8–1.2)
Prothrombin Time: 30 seconds — ABNORMAL HIGH (ref 11.4–15.2)

## 2021-07-27 LAB — LACTATE DEHYDROGENASE: LDH: 236 U/L — ABNORMAL HIGH (ref 98–192)

## 2021-07-27 LAB — PHOSPHORUS: Phosphorus: 3.6 mg/dL (ref 2.5–4.6)

## 2021-07-27 LAB — MAGNESIUM: Magnesium: 2 mg/dL (ref 1.7–2.4)

## 2021-07-27 SURGERY — CARDIOVERSION
Anesthesia: General

## 2021-07-27 MED ORDER — SODIUM CHLORIDE 0.9 % IV SOLN: INTRAVENOUS | Status: DC | PRN

## 2021-07-27 MED ORDER — POTASSIUM CHLORIDE CRYS ER 20 MEQ PO TBCR
20.0000 meq | EXTENDED_RELEASE_TABLET | ORAL | Status: AC
Start: 2021-07-27 — End: 2021-07-27
  Administered 2021-07-27 (×3): 20 meq via ORAL
  Filled 2021-07-27 (×3): qty 1

## 2021-07-27 MED ORDER — HYDRALAZINE HCL 20 MG/ML IJ SOLN
10.0000 mg | Freq: Once | INTRAMUSCULAR | Status: AC
  Administered 2021-07-27: 10 mg via INTRAVENOUS
  Filled 2021-07-27: qty 1

## 2021-07-27 MED ORDER — HYDRALAZINE HCL 25 MG PO TABS
25.0000 mg | ORAL_TABLET | Freq: Two times a day (BID) | ORAL | Status: DC
  Administered 2021-07-27 (×2): 25 mg via ORAL
  Filled 2021-07-27: qty 1

## 2021-07-27 MED ORDER — WARFARIN SODIUM 1 MG PO TABS
1.0000 mg | ORAL_TABLET | Freq: Every day | ORAL | Status: DC
  Administered 2021-07-27: 1 mg via ORAL
  Filled 2021-07-27: qty 1

## 2021-07-27 MED ORDER — HYDRALAZINE HCL 25 MG PO TABS
25.0000 mg | ORAL_TABLET | Freq: Four times a day (QID) | ORAL | Status: DC
  Filled 2021-07-27: qty 1

## 2021-07-27 MED ORDER — PROPOFOL 10 MG/ML IV BOLUS
INTRAVENOUS | Status: DC | PRN
  Administered 2021-07-27: 20 mg via INTRAVENOUS
  Administered 2021-07-27 (×2): 10 mg via INTRAVENOUS

## 2021-07-27 MED ORDER — SPIRONOLACTONE 25 MG PO TABS
25.0000 mg | ORAL_TABLET | Freq: Every day | ORAL | Status: DC
  Administered 2021-07-27 – 2021-08-08 (×13): 25 mg via ORAL
  Filled 2021-07-27 (×13): qty 1

## 2021-07-27 MED ORDER — CHLORHEXIDINE GLUCONATE CLOTH 2 % EX PADS
6.0000 | MEDICATED_PAD | Freq: Every day | CUTANEOUS | Status: DC
  Administered 2021-07-27 – 2021-08-07 (×11): 6 via TOPICAL

## 2021-07-27 NOTE — Progress Notes (Signed)
Patient ID: Jeffrey [name], male   DOB: 1976/09/21, 45 y.o.   MRN: [id number] HeartMate 3 Rounding Note  Subjective:    Says his gout feels worse today compared to yesterday when it was improving.  Co-ox 66.2 this am on milrinone 0.25 CVP 12-14  -1100 cc yesterday but weight unchanged  LVAD INTERROGATION:  HeartMate IIl LVAD:  Flow 4.7 liters/min, speed 5900, power 5, PI 3.2.    Objective:    Vital Signs:   Temp:  [98.5 F (36.9 C)-98.8 F (37.1 C)] 98.8 F (37.1 C) (10/23 0750) Pulse Rate:  [124-202] 167 (10/23 1000) Resp:  [7-35] 14 (10/23 1000) BP: (85-152)/(65-120) 124/113 (10/23 1000) SpO2:  [89 %-98 %] 95 % (10/23 1000) Weight:  [137.4 kg] 137.4 kg (10/23 0645) Last BM Date: 07/18/21 Mean arterial Pressure 94  Intake/Output:   Intake/Output Summary (Last 24 hours) at 07/27/2021 1111 Last data filed at 07/27/2021 0948 Gross per 24 hour  Intake 1638.45 ml  Output 2645 ml  Net -1006.55 ml     Physical Exam: General:  Well appearing. No resp difficulty HEENT: normal Cor: Distant heart sounds with LVAD hum present. Lungs: clear Abdomen: soft, nontender, nondistended. Good bowel sounds. Extremities: no significant edema Neuro: alert & orientedx3, cranial nerves grossly intact. moves all 4 extremities w/o difficulty. Affect pleasant  Telemetry: AFib/flutter 130-140's  Labs: Basic Metabolic Panel: Recent Labs  Lab 07/23/21 0424 07/24/21 0350 07/24/21 0401 07/25/21 0323 07/25/21 0436 07/25/21 2016 07/26/21 0344 07/26/21 1707 07/27/21 0359  NA 131* 133*   < > 135 136 132* 135 130* 135  K 4.9 4.4   < > 3.9 3.8 3.5 3.2* 3.8 3.5  CL 101 101  --  102  --  96* 99 96* 98  CO2 22 24  --  26  --  _0 GLUCOSE 207* 134*  --  131*  --  193* 137* 322* 131*  BUN 20 30*  --  31*  --  28* 31* 32* 34*  CREATININE 1.63* 1.66*  --  1.47*  --  1.45* 1.39* 1.36* 1.47*  CALCIUM 9.9 10.6*  --  10.2  --  9.9 10.3 9.5 10.3  MG 2.2 2.3  --  2.2  --   --  1.9  --  2.0   PHOS 4.7* 5.0*  --  3.8  --   --  3.4  --  3.6   < > = values in this interval not displayed.    Liver Function Tests: Recent Labs  Lab 07/22/21 0306 07/23/21 0424 07/24/21 0350  AST 60* 45* 26  ALT _1 ALKPHOS 56 74 72  BILITOT 0.8 1.1 0.7  PROT 6.4* 6.5 6.5  ALBUMIN 2.9* 2.5* 2.5*   No results for input(s): LIPASE, AMYLASE in the last 168 hours. No results for input(s): AMMONIA in the last 168 hours.  CBC: Recent Labs  Lab 07/23/21 0424 07/24/21 0350 07/24/21 0401 07/24/21 0449 07/25/21 0323 07/25/21 0436 07/26/21 0344 07/27/21 0359  WBC 19.0* 19.4*  --   --  12.8*  --  11.6* 13.1*  NEUTROABS 15.6* 15.2*  --   --  9.4*  --  7.5 8.5*  HGB 11.2* 10.6*   < > 11.9* 10.1* 10.9* 10.0* 9.9*  HCT 35.7* 34.3*   < > 35.0* 32.4* 32.0* 32.0* 31.3*  MCV 78.1* 79.2*  --   --  77.7*  --  76.2* 75.4*  PLT 228 225  --   --  235  --  309 391   < > = values in this interval not displayed.    INR: Recent Labs  Lab 07/23/21 0614 07/24/21 0350 07/25/21 0323 07/26/21 0344 07/27/21 0359  INR 1.9* 1.9* 2.0* 3.3* 2.9*    Other results: EKG:   Imaging: No results found.   Medications:     Scheduled Medications:  alteplase  2 mg Intracatheter Once   amiodarone  150 mg Intravenous Once   bisacodyl  10 mg Oral Daily   Or   bisacodyl  10 mg Rectal Daily   chlorhexidine gluconate (MEDLINE KIT)  15 mL Mouth Rinse BID   Chlorhexidine Gluconate Cloth  6 each Topical Daily   colchicine  0.6 mg Oral Daily   docusate sodium  200 mg Oral Daily   insulin aspart  0-20 Units Subcutaneous Q4H   insulin detemir  30 Units Subcutaneous BID   mouth rinse  15 mL Mouth Rinse BID   pantoprazole  40 mg Oral Daily   polyethylene glycol  17 g Oral BID   potassium chloride  20 mEq Oral Q4H   sertraline  25 mg Oral Daily   sodium chloride flush  10-40 mL Intracatheter Q12H   sodium chloride flush  3 mL Intravenous Q12H   traZODone  100 mg Per Tube QHS   warfarin  1 mg Oral q1600    Warfarin - Physician Dosing Inpatient   Does not apply q1600    Infusions:  sodium chloride Stopped (07/22/21 2203)   sodium chloride Stopped (07/24/21 1320)   albumin human Stopped (07/21/21 1516)   And   sodium chloride     amiodarone 60 mg/hr (07/27/21 0607)   furosemide (LASIX) 200 mg in dextrose 5% 100 mL (40m/mL) infusion 15 mg/hr (07/27/21 0500)   lactated ringers     meropenem (MERREM) IV 1 g (07/27/21 00254   milrinone 0.25 mcg/kg/min (07/27/21 0908)   vancomycin Stopped (07/27/21 0149)    PRN Medications: sodium chloride, albumin human **AND** sodium chloride, dextrose, morphine injection, ondansetron (ZOFRAN) IV, oxyCODONE, sodium chloride flush, sodium chloride flush, traMADol  Assessment/Plan/Discussion:     POD 6 s/p HM 3 LVAD for acute on chronic systolic heart failure due to NICM. Mild to moderate RV dysfunction with normal valves.   Stage 3 CKD with solitary kidney. Baseline creat 1.6-2 over the past month but was up to 4.6 at end of July. 1.5 preop.  Hematuria with MRI and cysto ok. He is diuresing with lasix drip with 3L UO but 2L in. Creat up slightly today.   OSA on CPAP.  Oxygenation improved, afebrile and leukocytosis resolving. Probably inflammatory.   Started on vanc and meropenem Wednesday due to leukocytosis and low grade fever. I would be very careful with vanc since he only has one kidney that is abnormal. It is also a lot of volume.   PAF: on amio IV. S/p DCCV in August. Remains in AF with RVR  This looks like atrial flutter in the 130's. Could not be rapid atrial paced. Plan DCCV today.  Poorly controlled DM preop. His glucose is under good control postop on Levemir and SSI.    Polyarticular gout flare: brief steroids and colchicine.   INR down slightly to 2.9. Small dose tonight. Probably pull pacing wires tomorrow if INR stable to decreased.   Continue IS, mobilization as tolerated.   I reviewed the LVAD parameters from today, and compared  the results to the patient's prior recorded data.  No programming  changes were made.  The LVAD is functioning within specified parameters.    Length of Stay: [address]  Fernande Boyden [facility] 07/27/2021, 11:11 AM

## 2021-07-27 NOTE — Anesthesia Procedure Notes (Signed)
Procedure Name: General with mask airway Date/Time: 07/27/2021 12:00 PM Performed by: Imagene Riches, CRNA Pre-anesthesia Checklist: Patient identified, Emergency Drugs available, Patient being monitored, Suction available and Timeout performed Patient Re-evaluated:Patient Re-evaluated prior to induction Oxygen Delivery Method: Ambu bag

## 2021-07-27 NOTE — Transfer of Care (Signed)
Immediate Anesthesia Transfer of Care Note  Patient: [name]  Procedure(s) Performed: CARDIOVERSION  Patient Location: ICU  Anesthesia Type:General  Level of Consciousness: drowsy  Airway & Oxygen Therapy: Patient Spontanous Breathing and Nasal CPAP  Post-op Assessment: Report given to RN and Post -op Vital signs reviewed and stable  Post vital signs: Reviewed and stable  Last Vitals:  Vitals Value Taken Time  BP 111/96 07/27/21 1210  Temp    Pulse 137 07/27/21 1211  Resp 19 07/27/21 1211  SpO2 91 % 07/27/21 1211  Vitals shown include unvalidated device data.  Last Pain:  Vitals:   07/27/21 1154  TempSrc: Oral  PainSc:       Patients Stated Pain Goal: 2 (62/69/48 [id number])  Complications: No notable events documented.

## 2021-07-27 NOTE — Anesthesia Preprocedure Evaluation (Signed)
Anesthesia Evaluation  Patient identified by MRN, date of birth, ID band Patient awake    Reviewed: Allergy & Precautions, NPO status , Patient's Chart, lab work & pertinent test results  History of Anesthesia Complications Negative for: history of anesthetic complications  Airway Mallampati: IV  TM Distance: >3 FB Neck ROM: Full    Dental  (+) Dental Advisory Given, Teeth Intact   Pulmonary shortness of breath, asthma , sleep apnea , former smoker,    breath sounds clear to auscultation       Cardiovascular hypertension, Pt. on medications + Past MI and +CHF  + dysrhythmias Atrial Fibrillation + Cardiac Defibrillator  Rhythm:Irregular  1. Left ventricular ejection fraction, by estimation, is <20%. The left  ventricle has severely decreased function. The left ventricle demonstrates  global hypokinesis. The left ventricular internal cavity size was severely  dilated. Left ventricular  diastolic parameters are consistent with Grade III diastolic dysfunction  (restrictive). Elevated left atrial pressure.  2. Right ventricular systolic function is moderately reduced. The right  ventricular size is normal. There is mildly elevated pulmonary artery  systolic pressure. The estimated right ventricular systolic pressure is  03.5 mmHg.  3. Left atrial size was severely dilated.  4. The mitral valve is normal in structure. Trivial mitral valve  regurgitation.  5. The aortic valve is tricuspid. Aortic valve regurgitation is not  visualized. No aortic stenosis is present.  6. The inferior vena cava is dilated in size with <50% respiratory  variability, suggesting right atrial pressure of 15 mmHg.    Ao = 118/83 (95) RA = 15 RV = 69/20 PA = 72/35 (48)  PCW = 25 Fick cardiac output/index = 4.6/1.9 Thermo CO/CI = 6.2/2.6  PVR = 5.0 (Fick) 3.7 (Thermo) FA sat = 91% PA sat = 50%, 50% PAPi = 2.5  Assessment: 1. Moderately  elevated biventricular pressures with moderately to severely decreased cardiac index  Plan/Discussion:  Admit to ICU. Start milrinone. Plan VAD Monday once optimized.     Neuro/Psych PSYCHIATRIC DISORDERS Anxiety negative neurological ROS     GI/Hepatic negative GI ROS, Neg liver ROS,   Endo/Other  diabetes, Insulin DependentMorbid obesity  Renal/GU CRFRenal diseaseLab Results      Component                Value               Date                      CREATININE               1.50 (H)            07/21/2021           Lab Results      Component                Value               Date                      K                        4.5                 07/21/2021                Musculoskeletal  (+) Arthritis ,   Abdominal  Peds  Hematology  (+) anemia , Lab Results      Component                Value               Date                      WBC                      8.3                 07/21/2021                HGB                      15.6                07/21/2021                HCT                      46.0                07/21/2021                MCV                      77.8 (L)            07/21/2021                PLT                      327                 07/21/2021              Anesthesia Other Findings LVAD  Reproductive/Obstetrics                             Anesthesia Physical Anesthesia Plan  ASA: 4  Anesthesia Plan: General   Post-op Pain Management:    Induction: Intravenous  PONV Risk Score and Plan: 2 and Treatment may vary due to age or medical condition  Airway Management Planned: Mask and Nasal Cannula  Additional Equipment: None  Intra-op Plan:   Post-operative Plan:   Informed Consent: I have reviewed the patients History and Physical, chart, labs and discussed the procedure including the risks, benefits and alternatives for the proposed anesthesia with the patient or authorized representative who has indicated  his/her understanding and acceptance.     Dental advisory given  Plan Discussed with: CRNA and Anesthesiologist  Anesthesia Plan Comments:         Anesthesia Quick Evaluation

## 2021-07-27 NOTE — Anesthesia Postprocedure Evaluation (Signed)
Anesthesia Post Note  Patient: Jeffrey [name]  Procedure(s) Performed: CARDIOVERSION     Patient location during evaluation: ICU Anesthesia Type: General Level of consciousness: awake and patient cooperative Pain management: pain level controlled Vital Signs Assessment: post-procedure vital signs reviewed and stable Respiratory status: spontaneous breathing and respiratory function stable (nasal cpap) Cardiovascular status: stable Postop Assessment: no apparent nausea or vomiting Anesthetic complications: no   No notable events documented.  Last Vitals:  Vitals:   07/27/21 1209 07/27/21 1210  BP: (!) 107/91 (!) 111/96  Pulse: (!) 101 88  Resp: (!) 21 15  Temp:    SpO2: 91% 91%    Last Pain:  Vitals:   07/27/21 1310  TempSrc:   PainSc: 7                  Salli Bodin

## 2021-07-27 NOTE — Progress Notes (Signed)
Drive Line Dressing Change  Existing VAD dressing removed and site care performed using sterile technique. Small amount of bloody drainage at drive line exit site. No redness, tenderness, or foul odor noted. Drive line exit site cleaned with Chlora prep applicators x 2, allowed to dry, silver strip and guaze dressing applied.  Drive line anchors x 2 secure. Education provided to patient throughout procedure. Daily dressing changes with next dressing change due 07/27/21.

## 2021-07-27 NOTE — Interval H&P Note (Signed)
History and Physical Interval Note:  07/27/2021 12:58 PM  Jeffrey [name]  has presented today for surgery, with the diagnosis of AF.  The various methods of treatment have been discussed with the patient and family. After consideration of risks, benefits and other options for treatment, the patient has consented to  Procedure(s): CARDIOVERSION (N/A) as a surgical intervention.  The patient's history has been reviewed, patient examined, no change in status, stable for surgery.  I have reviewed the patient's chart and labs.  Questions were answered to the patient's satisfaction.     Mariell Nester

## 2021-07-27 NOTE — CV Procedure (Signed)
    DIRECT CURRENT CARDIOVERSION  NAME:  Jeffrey [name]   MRN: [id number] DOB:  1976/03/28   ADMIT DATE: 07/18/2021   INDICATIONS: Atrial fibrillation    PROCEDURE:   Informed consent was obtained prior to the procedure. The risks, benefits and alternatives for the procedure were discussed and the patient comprehended these risks. Once an appropriate time out was taken, the patient had the defibrillator pads placed in the anterior and posterior position. The patient then underwent sedation by the anesthesia service. Once an appropriate level of sedation was achieved, the patient received a single biphasic, synchronized 200J shock with prompt conversion to sinus rhythm. No apparent complications.  Glori Bickers, MD  12:59 PM

## 2021-07-27 NOTE — H&P (View-Only) (Signed)
Advanced Heart Failure VAD Team Note  PCP-Cardiologist: Glori Bickers, MD   Subjective:    10/17: HM3 LVAD implant (DT)  10/18: Extubated   On milrinone at 0.25. co-ox 66%  On lasix gtt at 15 and also got metolazone. > 3L out.  Weight unchanged  Remains in AF/AFL 130-150. On amio gtt. Failed RAP x 3  Gout pain improving with po prednisone and colchicine  LVAD INTERROGATION:  HeartMate III LVAD:   Flow 4.7 liters/min, speed 5900, power 5.0  PI 3.2  Objective:    Vital Signs:   Temp:  [98.5 F (36.9 C)-98.8 F (37.1 C)] 98.8 F (37.1 C) (10/23 0750) Pulse Rate:  [124-202] 150 (10/23 0800) Resp:  [7-35] 16 (10/23 0800) BP: (85-152)/(65-120) 103/89 (10/23 0800) SpO2:  [89 %-98 %] 94 % (10/23 0800) Weight:  [137.4 kg] 137.4 kg (10/23 0645) Last BM Date: 07/18/21 Mean arterial Pressure 90s  Intake/Output:   Intake/Output Summary (Last 24 hours) at 07/27/2021 1014 Last data filed at 07/27/2021 0948 Gross per 24 hour  Intake 1689.1 ml  Output 2770 ml  Net -1080.9 ml      Physical Exam    General:  Sitting up in chair  HEENT: normal  Neck: supple. JVP to jaw .  Carotids 2+ bilat; no bruits. No lymphadenopathy or thryomegaly appreciated. Cor:  sternal dressing ok LVAD hum.  Lungs: Clear. Abdomen: obese soft, nontender, non-distended. No hepatosplenomegaly. No bruits or masses. Good bowel sounds. Driveline site clean. Anchor in place.  Extremities: no cyanosis, clubbing, rash. Warm 1-2+ 3dema  Neuro: alert & oriented x 3. No focal deficits. Moves all 4 without problem    Telemetry   AF 130-150 Personally reviewed   Labs   Basic Metabolic Panel: Recent Labs  Lab 07/23/21 0424 07/24/21 0350 07/24/21 0401 07/25/21 0323 07/25/21 0436 07/25/21 2016 07/26/21 0344 07/26/21 1707 07/27/21 0359  NA 131* 133*   < > 135 136 132* 135 130* 135  K 4.9 4.4   < > 3.9 3.8 3.5 3.2* 3.8 3.5  CL 101 101  --  102  --  96* 99 96* 98  CO2 22 24  --  26  --  _0 GLUCOSE 207* 134*  --  131*  --  193* 137* 322* 131*  BUN 20 30*  --  31*  --  28* 31* 32* 34*  CREATININE 1.63* 1.66*  --  1.47*  --  1.45* 1.39* 1.36* 1.47*  CALCIUM 9.9 10.6*  --  10.2  --  9.9 10.3 9.5 10.3  MG 2.2 2.3  --  2.2  --   --  1.9  --  2.0  PHOS 4.7* 5.0*  --  3.8  --   --  3.4  --  3.6   < > = values in this interval not displayed.     Liver Function Tests: Recent Labs  Lab 07/20/21 1107 07/22/21 0306 07/23/21 0424 07/24/21 0350  AST 20 60* 45* 26  ALT 42 _1 ALKPHOS 69 56 74 72  BILITOT 0.7 0.8 1.1 0.7  PROT 7.4 6.4* 6.5 6.5  ALBUMIN 3.4* 2.9* 2.5* 2.5*    No results for input(s): LIPASE, AMYLASE in the last 168 hours. No results for input(s): AMMONIA in the last 168 hours.  CBC: Recent Labs  Lab 07/23/21 0424 07/24/21 0350 07/24/21 0401 07/24/21 0449 07/25/21 0323 07/25/21 0436 07/26/21 0344 07/27/21 0359  WBC 19.0* 19.4*  --   --  12.8*  --  11.6* 13.1*  NEUTROABS 15.6* 15.2*  --   --  9.4*  --  7.5 8.5*  HGB 11.2* 10.6*   < > 11.9* 10.1* 10.9* 10.0* 9.9*  HCT 35.7* 34.3*   < > 35.0* 32.4* 32.0* 32.0* 31.3*  MCV 78.1* 79.2*  --   --  77.7*  --  76.2* 75.4*  PLT 228 225  --   --  235  --  309 391   < > = values in this interval not displayed.     INR: Recent Labs  Lab 07/23/21 0614 07/24/21 0350 07/25/21 0323 07/26/21 0344 07/27/21 0359  INR 1.9* 1.9* 2.0* 3.3* 2.9*     Other results:   Imaging   No results found.   Medications:     Scheduled Medications:  alteplase  2 mg Intracatheter Once   amiodarone  150 mg Intravenous Once   bisacodyl  10 mg Oral Daily   Or   bisacodyl  10 mg Rectal Daily   chlorhexidine gluconate (MEDLINE KIT)  15 mL Mouth Rinse BID   Chlorhexidine Gluconate Cloth  6 each Topical Daily   colchicine  0.6 mg Oral Daily   docusate sodium  200 mg Oral Daily   insulin aspart  0-20 Units Subcutaneous Q4H   insulin detemir  30 Units Subcutaneous BID   mouth rinse  15 mL Mouth Rinse BID    pantoprazole  40 mg Oral Daily   polyethylene glycol  17 g Oral BID   potassium chloride  20 mEq Oral Q4H   sertraline  25 mg Oral Daily   sodium chloride flush  10-40 mL Intracatheter Q12H   sodium chloride flush  3 mL Intravenous Q12H   traZODone  100 mg Per Tube QHS   Warfarin - Physician Dosing Inpatient   Does not apply q1600    Infusions:  sodium chloride Stopped (07/22/21 2203)   sodium chloride Stopped (07/24/21 1320)   albumin human Stopped (07/21/21 1516)   And   sodium chloride     amiodarone 60 mg/hr (07/27/21 0607)   furosemide (LASIX) 200 mg in dextrose 5% 100 mL (45m/mL) infusion 15 mg/hr (07/27/21 0500)   lactated ringers     meropenem (MERREM) IV 1 g (07/27/21 01194   milrinone 0.25 mcg/kg/min (07/27/21 0908)   vancomycin Stopped (07/27/21 0149)    PRN Medications: sodium chloride, albumin human **AND** sodium chloride, dextrose, morphine injection, ondansetron (ZOFRAN) IV, oxyCODONE, sodium chloride flush, sodium chloride flush, traMADol   Assessment/Plan:    1. Acute on Chronic systolic CHF - Nonischemic CM, end-stage - Echo (9/21): EF 20-25%.   - Echo  8/22: EF < 20%, severe LV dilation, moderate RV dysfunction.   - RHC (8/22): well compensated hemodynamics. - s/p S-ICD - NYHA IV  - Body mass index is 45.42 kg/m. Not transplant candidate - S/p HM3 LVAD 10/17 (DT) - On milrinone 0.25. Co-ox 66% With persistent AF will drop milrinone to 0.123 - Remains volume overloaded, CVP 12-14 Wt still up > 20 pounds - Will increase lasix gtt after DC-CV.  Supp K  Will repeat metolazone 2.5 as needed  2. VAD  - s/p HM-3 placement 10/17 - LDH 236 - MAPs slightly high. Will address after DC-CV - VAD interrogated personally. Parameters stable. - Ramp echo tomorrow or Tuesday - INR 2.9.  3 Atrial fibrillation: Paroxysmal - Had severe AF with RVR 7/22 s/p DC-CV x 2 - Seen by EP. Not a candidate for ablation due  to body habitus  (could consider AVN ablation and  CRT, if needed)  - Back in AF with RVR post VAD. Failed RAP x 3.  - Continue IV amio.  - Plan bedside DC-CV today. D/w Anesthesia service - warfarin per CT surgery    4. CKD stage 3B  - Has solitary kidney.   - Baseline creatinine is 1.6-2.9.  - SCr 1.39 -> 1.47 today, monitor closely    5. OSA - Continue CPAP   6. DM2. Poorly controlled - On Jardiance. - Hgb A1c 9.4. - Cover with SSI   7. Hematuria - MRI and cystoscopy ok   8. Polyarticular gout. - severe - improving on prednisone and colchicine    I reviewed theLVAD parameters from today, and compared the results to the patient's prior recorded data.  No programming changes were made.  The LVAD is functioning within specified parameters.  The patient performs LVAD self-test daily.  LVAD interrogation was negative for any significant power changes, alarms or PI events/speed drops.  LVAD equipment check completed and is in good working order.  Back-up equipment present.   LVAD education done on emergency procedures and precautions and reviewed exit site care.  Length of Stay: [place], MD 07/27/2021, 10:14 AM  VAD Team --- VAD ISSUES ONLY--- Pager [phone] (7am - 7am)  Advanced Heart Failure Team  Pager [phone] (M-F; 7a - 5p)  Please contact [facility] for night-coverage after hours (5p -7a ) and weekends on [email or web address]

## 2021-07-27 NOTE — Progress Notes (Signed)
Advanced Heart Failure VAD Team Note  PCP-Cardiologist: Glori Bickers, MD   Subjective:    10/17: HM3 LVAD implant (DT)  10/18: Extubated   On milrinone at 0.25. co-ox 66%  On lasix gtt at 15 and also got metolazone. > 3L out.  Weight unchanged  Remains in AF/AFL 130-150. On amio gtt. Failed RAP x 3  Gout pain improving with po prednisone and colchicine  LVAD INTERROGATION:  HeartMate III LVAD:   Flow 4.7 liters/min, speed 5900, power 5.0  PI 3.2  Objective:    Vital Signs:   Temp:  [98.5 F (36.9 C)-98.8 F (37.1 C)] 98.8 F (37.1 C) (10/23 0750) Pulse Rate:  [124-202] 150 (10/23 0800) Resp:  [7-35] 16 (10/23 0800) BP: (85-152)/(65-120) 103/89 (10/23 0800) SpO2:  [89 %-98 %] 94 % (10/23 0800) Weight:  [137.4 kg] 137.4 kg (10/23 0645) Last BM Date: 07/18/21 Mean arterial Pressure 90s  Intake/Output:   Intake/Output Summary (Last 24 hours) at 07/27/2021 1014 Last data filed at 07/27/2021 0948 Gross per 24 hour  Intake 1689.1 ml  Output 2770 ml  Net -1080.9 ml      Physical Exam    General:  Sitting up in chair  HEENT: normal  Neck: supple. JVP to jaw .  Carotids 2+ bilat; no bruits. No lymphadenopathy or thryomegaly appreciated. Cor:  sternal dressing ok LVAD hum.  Lungs: Clear. Abdomen: obese soft, nontender, non-distended. No hepatosplenomegaly. No bruits or masses. Good bowel sounds. Driveline site clean. Anchor in place.  Extremities: no cyanosis, clubbing, rash. Warm 1-2+ 3dema  Neuro: alert & oriented x 3. No focal deficits. Moves all 4 without problem    Telemetry   AF 130-150 Personally reviewed   Labs   Basic Metabolic Panel: Recent Labs  Lab 07/23/21 0424 07/24/21 0350 07/24/21 0401 07/25/21 0323 07/25/21 0436 07/25/21 2016 07/26/21 0344 07/26/21 1707 07/27/21 0359  NA 131* 133*   < > 135 136 132* 135 130* 135  K 4.9 4.4   < > 3.9 3.8 3.5 3.2* 3.8 3.5  CL 101 101  --  102  --  96* 99 96* 98  CO2 22 24  --  26  --  _0 GLUCOSE 207* 134*  --  131*  --  193* 137* 322* 131*  BUN 20 30*  --  31*  --  28* 31* 32* 34*  CREATININE 1.63* 1.66*  --  1.47*  --  1.45* 1.39* 1.36* 1.47*  CALCIUM 9.9 10.6*  --  10.2  --  9.9 10.3 9.5 10.3  MG 2.2 2.3  --  2.2  --   --  1.9  --  2.0  PHOS 4.7* 5.0*  --  3.8  --   --  3.4  --  3.6   < > = values in this interval not displayed.     Liver Function Tests: Recent Labs  Lab 07/20/21 1107 07/22/21 0306 07/23/21 0424 07/24/21 0350  AST 20 60* 45* 26  ALT 42 _1 ALKPHOS 69 56 74 72  BILITOT 0.7 0.8 1.1 0.7  PROT 7.4 6.4* 6.5 6.5  ALBUMIN 3.4* 2.9* 2.5* 2.5*    No results for input(s): LIPASE, AMYLASE in the last 168 hours. No results for input(s): AMMONIA in the last 168 hours.  CBC: Recent Labs  Lab 07/23/21 0424 07/24/21 0350 07/24/21 0401 07/24/21 0449 07/25/21 0323 07/25/21 0436 07/26/21 0344 07/27/21 0359  WBC 19.0* 19.4*  --   --  12.8*  --  11.6* 13.1*  NEUTROABS 15.6* 15.2*  --   --  9.4*  --  7.5 8.5*  HGB 11.2* 10.6*   < > 11.9* 10.1* 10.9* 10.0* 9.9*  HCT 35.7* 34.3*   < > 35.0* 32.4* 32.0* 32.0* 31.3*  MCV 78.1* 79.2*  --   --  77.7*  --  76.2* 75.4*  PLT 228 225  --   --  235  --  309 391   < > = values in this interval not displayed.     INR: Recent Labs  Lab 07/23/21 0614 07/24/21 0350 07/25/21 0323 07/26/21 0344 07/27/21 0359  INR 1.9* 1.9* 2.0* 3.3* 2.9*     Other results:   Imaging   No results found.   Medications:     Scheduled Medications:  alteplase  2 mg Intracatheter Once   amiodarone  150 mg Intravenous Once   bisacodyl  10 mg Oral Daily   Or   bisacodyl  10 mg Rectal Daily   chlorhexidine gluconate (MEDLINE KIT)  15 mL Mouth Rinse BID   Chlorhexidine Gluconate Cloth  6 each Topical Daily   colchicine  0.6 mg Oral Daily   docusate sodium  200 mg Oral Daily   insulin aspart  0-20 Units Subcutaneous Q4H   insulin detemir  30 Units Subcutaneous BID   mouth rinse  15 mL Mouth Rinse BID    pantoprazole  40 mg Oral Daily   polyethylene glycol  17 g Oral BID   potassium chloride  20 mEq Oral Q4H   sertraline  25 mg Oral Daily   sodium chloride flush  10-40 mL Intracatheter Q12H   sodium chloride flush  3 mL Intravenous Q12H   traZODone  100 mg Per Tube QHS   Warfarin - Physician Dosing Inpatient   Does not apply q1600    Infusions:  sodium chloride Stopped (07/22/21 2203)   sodium chloride Stopped (07/24/21 1320)   albumin human Stopped (07/21/21 1516)   And   sodium chloride     amiodarone 60 mg/hr (07/27/21 0607)   furosemide (LASIX) 200 mg in dextrose 5% 100 mL (45m/mL) infusion 15 mg/hr (07/27/21 0500)   lactated ringers     meropenem (MERREM) IV 1 g (07/27/21 01194   milrinone 0.25 mcg/kg/min (07/27/21 0908)   vancomycin Stopped (07/27/21 0149)    PRN Medications: sodium chloride, albumin human **AND** sodium chloride, dextrose, morphine injection, ondansetron (ZOFRAN) IV, oxyCODONE, sodium chloride flush, sodium chloride flush, traMADol   Assessment/Plan:    1. Acute on Chronic systolic CHF - Nonischemic CM, end-stage - Echo (9/21): EF 20-25%.   - Echo  8/22: EF < 20%, severe LV dilation, moderate RV dysfunction.   - RHC (8/22): well compensated hemodynamics. - s/p S-ICD - NYHA IV  - Body mass index is 45.42 kg/m. Not transplant candidate - S/p HM3 LVAD 10/17 (DT) - On milrinone 0.25. Co-ox 66% With persistent AF will drop milrinone to 0.123 - Remains volume overloaded, CVP 12-14 Wt still up > 20 pounds - Will increase lasix gtt after DC-CV.  Supp K  Will repeat metolazone 2.5 as needed  2. VAD  - s/p HM-3 placement 10/17 - LDH 236 - MAPs slightly high. Will address after DC-CV - VAD interrogated personally. Parameters stable. - Ramp echo tomorrow or Tuesday - INR 2.9.  3 Atrial fibrillation: Paroxysmal - Had severe AF with RVR 7/22 s/p DC-CV x 2 - Seen by EP. Not a candidate for ablation due  to body habitus  (could consider AVN ablation and  CRT, if needed)  - Back in AF with RVR post VAD. Failed RAP x 3.  - Continue IV amio.  - Plan bedside DC-CV today. D/w Anesthesia service - warfarin per CT surgery    4. CKD stage 3B  - Has solitary kidney.   - Baseline creatinine is 1.6-2.9.  - SCr 1.39 -> 1.47 today, monitor closely    5. OSA - Continue CPAP   6. DM2. Poorly controlled - On Jardiance. - Hgb A1c 9.4. - Cover with SSI   7. Hematuria - MRI and cystoscopy ok   8. Polyarticular gout. - severe - improving on prednisone and colchicine    I reviewed theLVAD parameters from today, and compared the results to the patient's prior recorded data.  No programming changes were made.  The LVAD is functioning within specified parameters.  The patient performs LVAD self-test daily.  LVAD interrogation was negative for any significant power changes, alarms or PI events/speed drops.  LVAD equipment check completed and is in good working order.  Back-up equipment present.   LVAD education done on emergency procedures and precautions and reviewed exit site care.  Length of Stay: [place], MD 07/27/2021, 10:14 AM  VAD Team --- VAD ISSUES ONLY--- Pager [phone] (7am - 7am)  Advanced Heart Failure Team  Pager [phone] (M-F; 7a - 5p)  Please contact [facility] for night-coverage after hours (5p -7a ) and weekends on [email or web address]

## 2021-07-27 NOTE — Progress Notes (Signed)
Patient ID: Jeffrey [name], male   DOB: April 12, 1976, 45 y.o.   MRN: [id number] [facility] Evening Rounds:  Hypertensive this afternoon with MAP 100-130. Had DCCV this morning to sinus 80-90. Gave him 10 mg IV hydralazine and will start bid and resume spiro.  Pump flow stable.  UO good on lasix 20/hr but I=O today so far.  CVP 15.  He feels ok except for gout pain.

## 2021-07-28 ENCOUNTER — Encounter (HOSPITAL_COMMUNITY): Payer: Self-pay | Admitting: Internal Medicine

## 2021-07-28 DIAGNOSIS — I5023 Acute on chronic systolic (congestive) heart failure: Secondary | ICD-10-CM | POA: Diagnosis not present

## 2021-07-28 DIAGNOSIS — Z95811 Presence of heart assist device: Secondary | ICD-10-CM | POA: Diagnosis not present

## 2021-07-28 DIAGNOSIS — I428 Other cardiomyopathies: Secondary | ICD-10-CM | POA: Diagnosis not present

## 2021-07-28 DIAGNOSIS — I4891 Unspecified atrial fibrillation: Secondary | ICD-10-CM | POA: Diagnosis not present

## 2021-07-28 DIAGNOSIS — I5022 Chronic systolic (congestive) heart failure: Secondary | ICD-10-CM | POA: Diagnosis not present

## 2021-07-28 LAB — CBC WITH DIFFERENTIAL/PLATELET
Abs Immature Granulocytes: 0.29 K/uL — ABNORMAL HIGH (ref 0.00–0.07)
Basophils Absolute: 0.1 K/uL (ref 0.0–0.1)
Basophils Relative: 0 %
Eosinophils Absolute: 0.1 K/uL (ref 0.0–0.5)
Eosinophils Relative: 1 %
HCT: 32 % — ABNORMAL LOW (ref 39.0–52.0)
Hemoglobin: 10.7 g/dL — ABNORMAL LOW (ref 13.0–17.0)
Immature Granulocytes: 2 %
Lymphocytes Relative: 11 %
Lymphs Abs: 1.9 K/uL (ref 0.7–4.0)
MCH: 24.3 pg — ABNORMAL LOW (ref 26.0–34.0)
MCHC: 33.4 g/dL (ref 30.0–36.0)
MCV: 72.7 fL — ABNORMAL LOW (ref 80.0–100.0)
Monocytes Absolute: 2.1 K/uL — ABNORMAL HIGH (ref 0.1–1.0)
Monocytes Relative: 13 %
Neutro Abs: 12.6 K/uL — ABNORMAL HIGH (ref 1.7–7.7)
Neutrophils Relative %: 73 %
Platelets: 403 K/uL — ABNORMAL HIGH (ref 150–400)
RBC: 4.4 MIL/uL (ref 4.22–5.81)
RDW: 16.3 % — ABNORMAL HIGH (ref 11.5–15.5)
WBC: 17.1 K/uL — ABNORMAL HIGH (ref 4.0–10.5)
nRBC: 0.9 % — ABNORMAL HIGH (ref 0.0–0.2)

## 2021-07-28 LAB — GLUCOSE, CAPILLARY
Glucose-Capillary: 135 mg/dL — ABNORMAL HIGH (ref 70–99)
Glucose-Capillary: 178 mg/dL — ABNORMAL HIGH (ref 70–99)
Glucose-Capillary: 148 mg/dL — ABNORMAL HIGH (ref 70–99)
Glucose-Capillary: 180 mg/dL — ABNORMAL HIGH (ref 70–99)
Glucose-Capillary: 213 mg/dL — ABNORMAL HIGH (ref 70–99)
Glucose-Capillary: 232 mg/dL — ABNORMAL HIGH (ref 70–99)

## 2021-07-28 LAB — BASIC METABOLIC PANEL
Anion gap: 11 (ref 5–15)
BUN: 41 mg/dL — ABNORMAL HIGH (ref 6–20)
CO2: 27 mmol/L (ref 22–32)
Calcium: 10.5 mg/dL — ABNORMAL HIGH (ref 8.9–10.3)
Chloride: 94 mmol/L — ABNORMAL LOW (ref 98–111)
Creatinine, Ser: 1.59 mg/dL — ABNORMAL HIGH (ref 0.61–1.24)
GFR, Estimated: 54 mL/min — ABNORMAL LOW (ref >60)
Glucose, Bld: 169 mg/dL — ABNORMAL HIGH (ref 70–99)
Potassium: 3.7 mmol/L (ref 3.5–5.1)
Sodium: 132 mmol/L — ABNORMAL LOW (ref 135–145)

## 2021-07-28 LAB — ECHO INTRAOPERATIVE TEE
AV Mean grad: 3 mmHg
Height: 67 in
Weight: 4673.75 oz

## 2021-07-28 LAB — PHOSPHORUS: Phosphorus: 4.4 mg/dL (ref 2.5–4.6)

## 2021-07-28 LAB — PROTIME-INR
INR: 2.3 — ABNORMAL HIGH (ref 0.8–1.2)
Prothrombin Time: 25.3 seconds — ABNORMAL HIGH (ref 11.4–15.2)

## 2021-07-28 LAB — CULTURE, BLOOD (ROUTINE X 2)
Culture: NO GROWTH
Culture: NO GROWTH
Special Requests: ADEQUATE
Special Requests: ADEQUATE

## 2021-07-28 LAB — MAGNESIUM: Magnesium: 2 mg/dL (ref 1.7–2.4)

## 2021-07-28 LAB — COOXEMETRY PANEL
Carboxyhemoglobin: 1.5 % (ref 0.5–1.5)
Methemoglobin: 0.8 % (ref 0.0–1.5)
O2 Saturation: 70.8 %
Total hemoglobin: 10.6 g/dL — ABNORMAL LOW (ref 12.0–16.0)

## 2021-07-28 LAB — LACTATE DEHYDROGENASE: LDH: 243 U/L — ABNORMAL HIGH (ref 98–192)

## 2021-07-28 LAB — BRAIN NATRIURETIC PEPTIDE: B Natriuretic Peptide: 676.1 pg/mL — ABNORMAL HIGH (ref 0.0–100.0)

## 2021-07-28 MED ORDER — PREDNISONE 20 MG PO TABS
40.0000 mg | ORAL_TABLET | Freq: Every day | ORAL | Status: AC
  Administered 2021-07-28 – 2021-07-29 (×2): 40 mg via ORAL
  Filled 2021-07-28 (×2): qty 2

## 2021-07-28 MED ORDER — POTASSIUM CHLORIDE CRYS ER 20 MEQ PO TBCR
20.0000 meq | EXTENDED_RELEASE_TABLET | ORAL | Status: AC
Start: 2021-07-28 — End: 2021-07-28
  Administered 2021-07-28 (×3): 20 meq via ORAL
  Filled 2021-07-28 (×3): qty 1

## 2021-07-28 MED ORDER — SORBITOL 70 % SOLN
30.0000 mL | Freq: Once | Status: AC
  Administered 2021-07-28: 30 mL via ORAL
  Filled 2021-07-28: qty 30

## 2021-07-28 MED ORDER — CHLORHEXIDINE GLUCONATE 0.12 % MT SOLN
OROMUCOSAL | Status: AC
  Filled 2021-07-28: qty 15

## 2021-07-28 MED ORDER — ALLOPURINOL 100 MG PO TABS
100.0000 mg | ORAL_TABLET | Freq: Every day | ORAL | Status: DC
  Administered 2021-07-28 – 2021-08-08 (×12): 100 mg via ORAL
  Filled 2021-07-28 (×12): qty 1

## 2021-07-28 MED ORDER — WARFARIN SODIUM 1 MG PO TABS
1.0000 mg | ORAL_TABLET | Freq: Once | ORAL | Status: AC
  Administered 2021-07-28: 1 mg via ORAL
  Filled 2021-07-28: qty 1

## 2021-07-28 MED ORDER — ISOSORB DINITRATE-HYDRALAZINE 20-37.5 MG PO TABS
1.0000 | ORAL_TABLET | Freq: Three times a day (TID) | ORAL | Status: DC
  Administered 2021-07-28 (×3): 1 via ORAL
  Filled 2021-07-28 (×3): qty 1

## 2021-07-28 MED ORDER — WARFARIN - PHARMACIST DOSING INPATIENT
Freq: Every day | Status: DC
Start: 2021-07-28 — End: 2021-08-08

## 2021-07-28 MED ORDER — SENNA 8.6 MG PO TABS
1.0000 | ORAL_TABLET | Freq: Every day | ORAL | Status: DC
  Administered 2021-07-28 – 2021-07-29 (×2): 8.6 mg via ORAL
  Filled 2021-07-28 (×2): qty 1

## 2021-07-28 NOTE — Progress Notes (Signed)
Patient ID: Jeffrey [name], male   DOB: [birth year], 45 y.o.   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 Able to get out of bed with assistance but can't walk yet.  Co-ox 71% this am on milrinone 0.125  Had had high MAP overnight and this morning and Bidil started. May be related to pain.   LVAD INTERROGATION:  HeartMate IIl LVAD:  Flow 4.7 liters/min, speed 5900, power 5, PI 3/6.    Objective:    Vital Signs:   Temp:  [97.7 F (36.5 C)-98.6 F (37 C)] 98.2 F (36.8 C) (10/24 1600) Pulse Rate:  [78-164] 83 (10/24 1600) Resp:  [16-34] 22 (10/24 1600) BP: (101-144)/(80-131) 132/109 (10/24 1600) SpO2:  [86 %-100 %] 95 % (10/24 1600) Weight:  [137.4 kg] 137.4 kg (10/24 0630) Last BM Date: 07/18/21 Mean arterial Pressure 90-110  Intake/Output:   Intake/Output Summary (Last 24 hours) at 07/28/2021 1743 Last data filed at 07/28/2021 1600 Gross per 24 hour  Intake 2134.41 ml  Output 3445 ml  Net -1310.59 ml     Physical Exam: General:  Well appearing. No resp difficulty HEENT: normal Neck: supple. JVP . Carotids 2+ bilat; no bruits. No lymphadenopathy or thryomegaly appreciated. Cor: Mechanical heart sounds with LVAD hum present. Lungs: clear Abdomen: soft, nontender, nondistended. No hepatosplenomegaly. No bruits or masses. Good bowel sounds. Extremities: no cyanosis, clubbing, rash, edema Neuro: alert & orientedx3, cranial nerves grossly intact. moves all 4 extremities w/o difficulty. Affect pleasant  Telemetry: sinus 80's  Labs: Basic Metabolic Panel: Recent Labs  Lab 07/24/21 0350 07/24/21 0401 07/25/21 0323 07/25/21 0436 07/26/21 0344 07/26/21 1707 07/27/21 0359 07/27/21 1712 07/28/21 0351  NA 133*   < > 135   < > 135 130* 135 131* 132*  K 4.4   < > 3.9   < > 3.2* 3.8 3.5 4.1 3.7  CL 101  --  102   < > 99 96* 98 96* 94*  CO2 24  --  26   < > _0 GLUCOSE 134*  --  131*   < >  137* 322* 131* 291* 169*  BUN 30*  --  31*   < > 31* 32* 34* 38* 41*  CREATININE 1.66*  --  1.47*   < > 1.39* 1.36* 1.47* 1.68* 1.59*  CALCIUM 10.6*  --  10.2   < > 10.3 9.5 10.3 10.1 10.5*  MG 2.3  --  2.2  --  1.9  --  2.0  --  2.0  PHOS 5.0*  --  3.8  --  3.4  --  3.6  --  4.4   < > = values in this interval not displayed.    Liver Function Tests: Recent Labs  Lab 07/22/21 0306 07/23/21 0424 07/24/21 0350  AST 60* 45* 26  ALT _1 ALKPHOS 56 74 72  BILITOT 0.8 1.1 0.7  PROT 6.4* 6.5 6.5  ALBUMIN 2.9* 2.5* 2.5*   No results for input(s): LIPASE, AMYLASE in the last 168 hours. No results for input(s): AMMONIA in the last 168 hours.  CBC: Recent Labs  Lab 07/24/21 0350 07/24/21 0401 07/25/21 0323 07/25/21 0436 07/26/21 0344 07/27/21 0359 07/28/21 0351  WBC 19.4*  --  12.8*  --  11.6* 13.1* 17.1*  NEUTROABS 15.2*  --  9.4*  --  7.5 8.5* 12.6*  HGB 10.6*   < >  10.1* 10.9* 10.0* 9.9* 10.7*  HCT 34.3*   < > 32.4* 32.0* 32.0* 31.3* 32.0*  MCV 79.2*  --  77.7*  --  76.2* 75.4* 72.7*  PLT 225  --  235  --  309 391 403*   < > = values in this interval not displayed.    INR: Recent Labs  Lab 07/24/21 0350 07/25/21 0323 07/26/21 0344 07/27/21 0359 07/28/21 0351  INR 1.9* 2.0* 3.3* 2.9* 2.3*    Other results: EKG:   Imaging: No results found.   Medications:     Scheduled Medications:  allopurinol  100 mg Oral Daily   alteplase  2 mg Intracatheter Once   amiodarone  150 mg Intravenous Once   bisacodyl  10 mg Oral Daily   Or   bisacodyl  10 mg Rectal Daily   chlorhexidine gluconate (MEDLINE KIT)  15 mL Mouth Rinse BID   Chlorhexidine Gluconate Cloth  6 each Topical Daily   colchicine  0.6 mg Oral Daily   docusate sodium  200 mg Oral Daily   insulin aspart  0-20 Units Subcutaneous Q4H   insulin detemir  30 Units Subcutaneous BID   isosorbide-hydrALAZINE  1 tablet Oral TID   mouth rinse  15 mL Mouth Rinse BID   pantoprazole  40 mg Oral Daily    polyethylene glycol  17 g Oral BID   predniSONE  40 mg Oral Q breakfast   senna  1 tablet Oral Daily   sertraline  25 mg Oral Daily   sodium chloride flush  10-40 mL Intracatheter Q12H   sodium chloride flush  3 mL Intravenous Q12H   spironolactone  25 mg Oral Daily   traZODone  100 mg Per Tube QHS   Warfarin - Pharmacist Dosing Inpatient   Does not apply q1600    Infusions:  sodium chloride 10 mL/hr at 07/28/21 1600   amiodarone 30 mg/hr (07/28/21 1600)   furosemide (LASIX) 200 mg in dextrose 5% 100 mL (81m/mL) infusion 20 mg/hr (07/28/21 1600)   milrinone 0.125 mcg/kg/min (07/28/21 1600)    PRN Medications: sodium chloride, dextrose, morphine injection, ondansetron (ZOFRAN) IV, oxyCODONE, sodium chloride flush, sodium chloride flush, traMADol   Assessment/Plan/Discussion:    POD 7 s/p HM 3 LVAD for acute on chronic systolic heart failure due to NICM. Mild to moderate RV dysfunction with normal valves.   Stage 3 CKD with solitary kidney. Baseline creat 1.6-2 over the past month but was up to 4.6 at end of July. 1.5 preop.  Hematuria with MRI and cysto ok. He is diuresing with lasix drip with >3L UO yesterday but 2L in. Creat stable at 1.59   OSA on CPAP.  Oxygenation improved, afebrile and leukocytosis resolving. Probably inflammatory.   Started on vanc and meropenem Wednesday due to leukocytosis and low grade fever. Vanc finished and meropenem done Tue/Wed.   PAF: on amio IV. S/p DCCV in August. Has maintained sinus since DCCV yesterday.    Poorly controlled DM preop. His glucose is under good control postop on Levemir and SSI.    Polyarticular gout flare: brief steroids and colchicine.   INR down to 2.3. DC pacing wires and pharmacy will dose from here.   Continue IS, mobilization as tolerated.   I reviewed the LVAD parameters from today, and compared the results to the patient's prior recorded data.  No programming changes were made.  The LVAD is functioning within  specified parameters.      Length of Stay: [address]  K Sapna Padron 07/28/2021, 5:43 PM

## 2021-07-28 NOTE — Progress Notes (Signed)
LVAD Coordinator Rounding Note:  Admitted 07/18/21 due to Dr. [name] for elective VAD placement.   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 Cyndia Bent under Destination Therapy criteria due to BMI and chronic kidney disease.   Pt lying in bed upon my arrival. Pt states that he is having issues with gout. Inpatient team has placed steriod orders and colchicine for pt. Pt tells me that he is unable to ambulate to the pain from his gout flare. Pt encouraged to get OOB to chair today.  Pt performed self test while I was in the room with him this morning without prompting.  Pt reports he has been changing his VAD power sources from patient cable to batteries and back again; reports no problems.   Pt back in NSR today after DCCV yesterday.   Pt has not had BM.  Vital signs: Temp:  98.3 HR: 85 NSR Doppler:  120 Auto cuff:  118/102 (109) O2 Sat: 96% on 5L/[place] Wt:  277>292>285>296.3>305.5>305.5>302.9 lbs   LVAD interrogation reveals:  Speed: 5900 Flow:  4.7 Power:  4.6w PI: 3.6 Hct: 32  Alarms:  none Events:  1 PI today; 4 yesterday  Fixed speed:  5900 Low speed limit: 5600  Drive Line: Existing VAD dressing removed and site care performed using sterile technique. Drive line exit site cleaned with Chlora prep applicators x 2, allowed to dry, and gauze dressing with silver strip applied. Exit site with one suture intact, the velour is fully implanted at exit site; small amount dry bloody drainage on silver strip with no redness, tenderness, or foul odor noted. Drive line anchor re-applied x 2. Will continue daily dressing changes per VAD Coordinator, Nurse Davonna Belling, or trained caregiver. Next dressing change due 07/29/21. Wife observed dressing change today and practiced donning sterile gloves.      Labs:  LDH trend: 318>372>312>302>243  INR trend: 1.3>1.9>1.9>2.0>2.3  Anticoagulation Plan: -INR Goal: 2.0 - 2.5  -ASA Dose: none due to allergy  Gtts:  Amiodarone   Blood  Products:  - 07/21/21 2 units FFP (intra op)  Device: - [employer] single ICD -Therapies: off   Respiratory: Extubated 07/22/21  Nitric Oxide:  Off 07/22/21  Infection:  - sputum culture 07/23/21>>> specimen rejected - urine culture 07/23/21>>>no growth (final) - blood culture 07/23/21>>>no growth final  VAD Education: Demonstrated dressing change to patient and his mother. Mother is an ICU nurse and already knows sterile technique. She feels comfortable with dressing change  and will be prepared to change next visit.  Discussed modular cable connection to patient and mother. The two cables are connected by aligning the triangles, applying firm force to engage the cables and rotating the locking nut until the clicking sound stops and the yellow line is no longer visible. Keep the connection dry. Pt demonstrated changing power sources today and demonstrated self test. Wife observed dressing change today and practiced donning sterile gloves.  Plan/Recommendations:  Call VAD Coordinator if any VAD equipment or drive line issues. Daily dressing changes per VAD Coordinator, Nurse Davonna Belling, or trained caregiver. Wife will perform dressing change tomorrow at 2:15 with VAD coordinator assisting.  Tanda Rockers RN [place] Coordinator  Office: [phone]  24/7 Pager: [phone]

## 2021-07-28 NOTE — Progress Notes (Signed)
Physical Therapy Treatment Patient Details Name: Jeffrey [name] MRN: [id number] DOB: 07/20/1976 Today's Date: 07/28/2021   History of Present Illness 45 y.o. male with class IV HF admitted 07/18/21 for pre-VAD optimization; has been relatively stable as outpatient with NYHA 3B symptoms. S/p RHC with swan placement 10/14. S/p HM3 LVAD implant on 10/17. Hypoxia overnight 10/19 requiring bipap. 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 Pt underwent cardioversion 10/23. PMH includes poorly controlled HTN, renal cell carcinoma s/p nephrectomy, CKD3, DM2, OSA, gout, morbid obesity, HF (EF<20%).    PT Comments    Pt currently with severe polyarticular gout which is limiting his mobility. As gout improves expect pt to again make good progress toward return home. Pt remains motivated to mobilize.    Recommendations for follow up therapy are one component of a multi-disciplinary discharge planning process, led by the attending physician.  Recommendations may be updated based on patient status, additional functional criteria and insurance authorization.  Follow Up Recommendations  Home health PT     Assistance Recommended at Discharge Intermittent Supervision/Assistance  Equipment Recommendations  Other (comment) (possibly bariatric rollator)    Recommendations for Other Services       Precautions / Restrictions Precautions Precautions: Fall;Sternal;Other (comment) Precaution Comments: LVAD. Reviewing sternal precautions     Mobility  Bed Mobility Overal bed mobility: Needs Assistance Bed Mobility: Supine to Sit     Supine to sit: Max assist;+2 for physical assistance;HOB elevated     General bed mobility comments: Assist bringing LE's off of bed, elevating trunk into sitting and bring hips to EOB.    Transfers Overall transfer level: Needs assistance Equipment used: Rolling walker (2 wheels);2 person hand held assist Transfers: Sit to/from Stand Sit to Stand: Mod assist;+2  physical assistance Stand pivot transfers: +2 physical assistance;Min guard;+2 safety/equipment         General transfer comment: Assist to bring hips up using bed pad and hand held assist. Pt used walker for small pivotal steps bed to chair. Cues for use of walker.    Ambulation/Gait             General Gait Details: Unable due to gout pain   Stairs             Wheelchair Mobility    Modified Rankin (Stroke Patients Only)       Balance Overall balance assessment: Needs assistance Sitting-balance support: No upper extremity supported;Feet supported Sitting balance-Leahy Scale: Fair     Standing balance support: Bilateral upper extremity supported;During functional activity Standing balance-Leahy Scale: Poor Standing balance comment: walker and +2 min guard                            Cognition Arousal/Alertness: Awake/alert Behavior During Therapy: WFL for tasks assessed/performed Overall Cognitive Status: Within Functional Limits for tasks assessed                                 General Comments: Motivated despite gout pain        Exercises      General Comments General comments (skin integrity, edema, etc.): VSS. BP slightly elevated. RN present throughout      Pertinent Vitals/Pain Pain Assessment: Faces Faces Pain Scale: Hurts whole lot Pain Location: Knees, elbows, hand, fingers (gout) Pain Descriptors / Indicators: Guarding;Grimacing;Moaning Pain Intervention(s): Limited activity within patient's tolerance;Monitored during session;Repositioned    Home Living  Prior Function            PT Goals (current goals can now be found in the care plan section) Acute Rehab PT Goals Patient Stated Goal: return home Progress towards PT goals: Not progressing toward goals - comment (due to severe gout pain)    Frequency    Min 3X/week      PT Plan Current plan remains  appropriate    Co-evaluation PT/OT/SLP Co-Evaluation/Treatment: Yes Reason for Co-Treatment: For patient/therapist safety;Other (comment) (pt's severe gout pain) PT goals addressed during session: Mobility/safety with mobility OT goals addressed during session: ADL's and self-care      AM-PAC PT "6 Clicks" Mobility   Outcome Measure  Help needed turning from your back to your side while in a flat bed without using bedrails?: Total Help needed moving from lying on your back to sitting on the side of a flat bed without using bedrails?: Total Help needed moving to and from a bed to a chair (including a wheelchair)?: Total Help needed standing up from a chair using your arms (e.g., wheelchair or bedside chair)?: Total Help needed to walk in hospital room?: Total Help needed climbing 3-5 steps with a railing? : Total 6 Click Score: 6    End of Session Equipment Utilized During Treatment: Oxygen Activity Tolerance: Patient limited by pain Patient left: in chair;with call [name]/phone within reach;with nursing/sitter in room Nurse Communication: Mobility status (nurse present/assisted with transfer) PT Visit Diagnosis: Other abnormalities of gait and mobility (R26.89);Difficulty in walking, not elsewhere classified (R26.2)     Time: 6122-4497 PT Time Calculation (min) (ACUTE ONLY): 23 min  Charges:  $Therapeutic Activity: 8-22 mins                     [place] Pager [phone] Office [place] 07/28/2021, 1:11 PM

## 2021-07-28 NOTE — Progress Notes (Signed)
ANTICOAGULATION CONSULT NOTE - Initial Consult  Pharmacy Consult for warfarin Indication:  LVAD  Allergies  Allergen Reactions   Aspirin Shortness Of Breath, Itching and Rash     Burning sensation (Patient reports he tolerates other NSAIDS)    Bee Venom Hives and Swelling   Lisinopril Cough   Shrimp (Diagnostic) Rash   Tomato Rash    Patient Measurements: Height: _0  (170.2 cm) Weight: (!) 137.4 kg (302 lb 14.6 oz) IBW/kg (Calculated) : 66.1  Vital Signs: Temp: 98.6 F (37 C) (10/24 0700) Temp Source: Oral (10/24 0400) BP: 118/102 (10/24 0800) Pulse Rate: 88 (10/24 0800)  Labs: Recent Labs    07/26/21 0344 07/26/21 1707 07/27/21 0359 07/27/21 1712 07/28/21 0351  HGB 10.0*  --  9.9*  --  10.7*  HCT 32.0*  --  31.3*  --  32.0*  PLT 309  --  391  --  403*  LABPROT 33.7*  --  30.0*  --  25.3*  INR 3.3*  --  2.9*  --  2.3*  CREATININE 1.39*   < > 1.47* 1.68* 1.59*   < > = values in this interval not displayed.    Estimated Creatinine Clearance: 77.7 mL/min (A) (by C-G formula based on SCr of 1.59 mg/dL (H)).   Medical History: Past Medical History:  Diagnosis Date   Anxiety    Aspirin allergy    Childhood asthma    Chronic systolic CHF (congestive heart failure) ([place])    a. EF 20-25% in 2012. b. EF 45-50% in 10/2011 with nonischemic nuc - presumed NICM. c. 12/2014 Echo: Sev depressed LV fxn, sev dil LV, mild LVH, mild MR, sev dil LA, mildly reduced RV fxn.   CKD (chronic kidney disease) stage 2, GFR 60-89 ml/min    H/O vasectomy 12/2019   High cholesterol    Hypertension    Morbid obesity ([place])    Nephrolithiasis    OSA on CPAP    Paroxysmal atrial fibrillation ([place])    Presumed NICM    a. 04/2014 Myoview: EF 26%, glob HK, sev glob HK, ? prior infarct;  b. Never cathed 2/2 CKD.   Renal cell carcinoma ([place])    a. s/p Rt robotic assisted partial converted to radical nephrectomy on 01/2013.   Troponin level elevated    a. 04/2014, 12/2014: felt due to CHF.    Type II diabetes mellitus ([facility])    Ventricular tachycardia    a. appropriate ICD therapy 12/2017    Medications:  Medications Prior to Admission  Medication Sig Dispense Refill Last Dose   acetaminophen (TYLENOL) 325 MG tablet Take 2 tablets (650 mg total) by mouth every 6 (six) hours as needed for fever.   Past Week   allopurinol (ZYLOPRIM) 100 MG tablet Take 1 tablet (100 mg total) by mouth daily. 90 tablet 3 07/18/2021   ALPRAZolam (XANAX) 0.5 MG tablet Take 1 tablet (0.5 mg total) by mouth 2 (two) times daily. 60 tablet 4 Past Week   amiodarone (PACERONE) 200 MG tablet Take 0.5 tablets (100 mg total) by mouth daily. 30 tablet 3 07/17/2021   apixaban (ELIQUIS) 5 MG TABS tablet Take 1 tablet (5 mg total) by mouth 2 (two) times daily. 60 tablet 3 07/14/2021   diclofenac Sodium (VOLTAREN) 1 % GEL Apply 2 g topically 4 (four) times daily. 100 g 0 Past Week   docusate sodium (COLACE) 100 MG capsule Take 1 capsule (100 mg total) by mouth 2 (two) times daily. 10 capsule 0 Past  Week   empagliflozin (JARDIANCE) 10 MG TABS tablet TAKE 1 TABLET (10 MG TOTAL) BY MOUTH DAILY BEFORE BREAKFAST. 90 tablet 3 07/18/2021   insulin aspart protamine - aspart (NOVOLOG 70/30 MIX) (70-30) 100 UNIT/ML FlexPen Inject 38 Units into the skin 2 (two) times daily. 15 mL 11 07/17/2021   Insulin Syringe-Needle U-100 (TRUEPLUS INSULIN SYRINGE) 31G X 5/16" 0.5 ML MISC USE TO INJECT INSULIN TWICE DAILY. 100 each 11 07/17/2021   losartan (COZAAR) 25 MG tablet Take 0.5 tablets (12.5 mg total) by mouth daily. 45 tablet 3 07/18/2021   metFORMIN (GLUCOPHAGE) 1000 MG tablet Take 1 tablet (1,000 mg total) by mouth 2 (two) times daily with a meal. 60 tablet 11 07/18/2021   pantoprazole (PROTONIX) 40 MG tablet Take 1 tablet (40 mg total) by mouth daily. 30 tablet 0 Past Week   polyethylene glycol (MIRALAX / GLYCOLAX) 17 g packet Take 17 g by mouth daily. 14 each 0 Past Week   sertraline (ZOLOFT) 25 MG tablet Take 1 tablet (25 mg total)  by mouth daily. 30 tablet 3 07/18/2021   spironolactone (ALDACTONE) 25 MG tablet Take 1 tablet (25 mg total) by mouth daily. 30 tablet 6 07/18/2021   torsemide (DEMADEX) 20 MG tablet Take 1 tablet (20 mg total) by mouth daily as needed (If wt gain > 3 lb in 24 hr or > 5 lb in 1 week). 30 tablet 2 Past Week   traMADol (ULTRAM) 50 MG tablet Take 2 tablets (100 mg total) by mouth every 12 (twelve) hours as needed for moderate pain. 30 tablet 0 Past Week   traZODone (DESYREL) 100 MG tablet Take 1 tablet (100 mg total) by mouth at bedtime. 30 tablet 0 07/17/2021   nitroGLYCERIN (NITROSTAT) 0.4 MG SL tablet Place 1 tablet (0.4 mg total) under the tongue every 5 (five) minutes x 3 doses as needed for chest pain. 25 tablet 12 Unknown   Scheduled:   allopurinol  100 mg Oral Daily   alteplase  2 mg Intracatheter Once   amiodarone  150 mg Intravenous Once   bisacodyl  10 mg Oral Daily   Or   bisacodyl  10 mg Rectal Daily   chlorhexidine gluconate (MEDLINE KIT)  15 mL Mouth Rinse BID   Chlorhexidine Gluconate Cloth  6 each Topical Daily   colchicine  0.6 mg Oral Daily   docusate sodium  200 mg Oral Daily   insulin aspart  0-20 Units Subcutaneous Q4H   insulin detemir  30 Units Subcutaneous BID   isosorbide-hydrALAZINE  1 tablet Oral TID   mouth rinse  15 mL Mouth Rinse BID   pantoprazole  40 mg Oral Daily   polyethylene glycol  17 g Oral BID   potassium chloride  20 mEq Oral Q4H   predniSONE  40 mg Oral Q breakfast   senna  1 tablet Oral Daily   sertraline  25 mg Oral Daily   sodium chloride flush  10-40 mL Intracatheter Q12H   sodium chloride flush  3 mL Intravenous Q12H   spironolactone  25 mg Oral Daily   traZODone  100 mg Per Tube QHS   warfarin  1 mg Oral q1600   Infusions:   sodium chloride Stopped (07/27/21 2314)   amiodarone 60 mg/hr (07/28/21 0800)   furosemide (LASIX) 200 mg in dextrose 5% 100 mL (26m/mL) infusion 20 mg/hr (07/28/21 0800)   milrinone 0.125 mcg/kg/min (07/28/21  0800)    Assessment: Patient is status post LVAD placement on 10/17. Warfarin was  initiated on 10/18. Most recent INR this morning is 2.3. Patient is also taking allopurinol, amiodarone, and prednisone. No bleeding reported. CBC stable. Will follow plans for pacing wire removal.  Goal of Therapy:  INR 2-2.5 Monitor platelets by anticoagulation protocol: Yes   Plan:  Give warfarin 1 mg today Daily INR  Thank you for allowing pharmacy to participate in this patient's care.  Reatha Harps, PharmD PGY1 Pharmacy Resident 07/28/2021 8:35 AM Check [email or web address] for unit specific pharmacy number

## 2021-07-28 NOTE — Progress Notes (Addendum)
Advanced Heart Failure VAD Team Note  PCP-Cardiologist: Jeffrey Bickers, MD   Subjective:    10/17: HM3 LVAD implant (DT)  10/18: Extubated 10/23: DC-CV for A fib RVR--> NSR. Milrinone cut back to 0.125 mcg.    Over the weekend Maps running high. Started on hydralazine 25 mg twice a day. Maps remains > 100.  Remains on amio drip+ milrinone 0.125 mcg. CO-OX 71%.   Complaining of  joint pain. No BM since surgery. Passing gas.   LVAD INTERROGATION:  HeartMate III LVAD:   Flow 4.7 liters/min, speed 5900, power 5.0  PI 4. Rate PI events.   Objective:    Vital Signs:   Temp:  [97.7 F (36.5 C)-98.8 F (37.1 C)] 98.6 F (37 C) (10/24 0400) Pulse Rate:  [35-167] 79 (10/24 0300) Resp:  [8-34] 18 (10/24 0600) BP: (103-160)/(86-144) 121/108 (10/24 0600) SpO2:  [88 %-96 %] 93 % (10/24 0600) Weight:  [137.4 kg] 137.4 kg (10/24 0630) Last BM Date: 07/18/21 Mean arterial Pressure 110s    Intake/Output:   Intake/Output Summary (Last 24 hours) at 07/28/2021 0726 Last data filed at 07/28/2021 0600 Gross per 24 hour  Intake 1863.39 ml  Output 3205 ml  Net -1341.61 ml     Physical Exam  CVP 12-13.   Physical Exam: GENERAL: No acute distress. HEENT: normal  NECK: Supple, JVP  11-12.  2+ bilaterally, no bruits.  No lymphadenopathy or thyromegaly appreciated.   CARDIAC:  Mechanical heart sounds with LVAD hum present.  LUNGS:  Clear to auscultation bilaterally.  ABDOMEN:  Soft, round, nontender, positive bowel sounds x4.     LVAD exit site: Dressing dry and intact.  No erythema or drainage.  Stabilization device present and accurately applied.  Driveline dressing is being changed daily per sterile technique. EXTREMITIES:  Warm and dry, no cyanosis, clubbing, rash or edema. RUE PICC   NEUROLOGIC:  Alert and oriented x 3.    No aphasia.  No dysarthria.  Affect pleasant.     Telemetry   NSR 80s personally reviewed.    Labs   Basic Metabolic Panel: Recent Labs  Lab  07/24/21 0350 07/24/21 0401 07/25/21 0323 07/25/21 0436 07/26/21 0344 07/26/21 1707 07/27/21 0359 07/27/21 1712 07/28/21 0351  NA 133*   < > 135   < > 135 130* 135 131* 132*  K 4.4   < > 3.9   < > 3.2* 3.8 3.5 4.1 3.7  CL 101  --  102   < > 99 96* 98 96* 94*  CO2 24  --  26   < > '27 26 28 27 27  ' GLUCOSE 134*  --  131*   < > 137* 322* 131* 291* 169*  BUN 30*  --  31*   < > 31* 32* 34* 38* 41*  CREATININE 1.66*  --  1.47*   < > 1.39* 1.36* 1.47* 1.68* 1.59*  CALCIUM 10.6*  --  10.2   < > 10.3 9.5 10.3 10.1 10.5*  MG 2.3  --  2.2  --  1.9  --  2.0  --  2.0  PHOS 5.0*  --  3.8  --  3.4  --  3.6  --  4.4   < > = values in this interval not displayed.    Liver Function Tests: Recent Labs  Lab 07/22/21 0306 07/23/21 0424 07/24/21 0350  AST 60* 45* 26  ALT '29 19 13  ' ALKPHOS 56 74 72  BILITOT 0.8 1.1 0.7  PROT 6.4*  6.5 6.5  ALBUMIN 2.9* 2.5* 2.5*   No results for input(s): LIPASE, AMYLASE in the last 168 hours. No results for input(s): AMMONIA in the last 168 hours.  CBC: Recent Labs  Lab 07/24/21 0350 07/24/21 0401 07/25/21 0323 07/25/21 0436 07/26/21 0344 07/27/21 0359 07/28/21 0351  WBC 19.4*  --  12.8*  --  11.6* 13.1* 17.1*  NEUTROABS 15.2*  --  9.4*  --  7.5 8.5* 12.6*  HGB 10.6*   < > 10.1* 10.9* 10.0* 9.9* 10.7*  HCT 34.3*   < > 32.4* 32.0* 32.0* 31.3* 32.0*  MCV 79.2*  --  77.7*  --  76.2* 75.4* 72.7*  PLT 225  --  235  --  309 391 403*   < > = values in this interval not displayed.    INR: Recent Labs  Lab 07/24/21 0350 07/25/21 0323 07/26/21 0344 07/27/21 0359 07/28/21 0351  INR 1.9* 2.0* 3.3* 2.9* 2.3*    Other results:   Imaging   No results found.   Medications:     Scheduled Medications:  alteplase  2 mg Intracatheter Once   amiodarone  150 mg Intravenous Once   bisacodyl  10 mg Oral Daily   Or   bisacodyl  10 mg Rectal Daily   chlorhexidine gluconate (MEDLINE KIT)  15 mL Mouth Rinse BID   Chlorhexidine Gluconate Cloth  6  each Topical Daily   colchicine  0.6 mg Oral Daily   docusate sodium  200 mg Oral Daily   hydrALAZINE  25 mg Oral BID   insulin aspart  0-20 Units Subcutaneous Q4H   insulin detemir  30 Units Subcutaneous BID   mouth rinse  15 mL Mouth Rinse BID   pantoprazole  40 mg Oral Daily   polyethylene glycol  17 g Oral BID   potassium chloride  20 mEq Oral Q4H   sertraline  25 mg Oral Daily   sodium chloride flush  10-40 mL Intracatheter Q12H   sodium chloride flush  3 mL Intravenous Q12H   spironolactone  25 mg Oral Daily   traZODone  100 mg Per Tube QHS   warfarin  1 mg Oral q1600   Warfarin - Physician Dosing Inpatient   Does not apply q1600    Infusions:  sodium chloride Stopped (07/27/21 2314)   amiodarone 60 mg/hr (07/28/21 0604)   furosemide (LASIX) 200 mg in dextrose 5% 100 mL (37m/mL) infusion 20 mg/hr (07/28/21 0600)   milrinone 0.125 mcg/kg/min (07/28/21 0600)    PRN Medications: sodium chloride, dextrose, morphine injection, ondansetron (ZOFRAN) IV, oxyCODONE, sodium chloride flush, sodium chloride flush, traMADol   Assessment/Plan:    1. Acute on Chronic systolic CHF - Nonischemic CM, end-stage - Echo (9/21): EF 20-25%.   - Echo  8/22: EF < 20%, severe LV dilation, moderate RV dysfunction.   - RHC (8/22): well compensated hemodynamics. - s/p S-ICD - NYHA IV  - Body mass index is 45.42 kg/m. Not transplant candidate - S/p HM3 LVAD 10/17 (DT) - On milrinone 0.125. Co-ox 70%. Back in NSR.  Continue milrinone while diuresing.  - CVP 12. Continue lasix drip 20 mg per hour. Watch renal function closely.   2. VAD  - s/p HM-3 placement 10/17 - LDH 243 - Maps 110s. Add Bidil 1 tab tid.  --Continue IV lasix.  - VAD interrogated personally. Parameters stable. - Ramp echo tomorrow or wednesday - INR 2.3  - NO BM x7 days. Add senna+ sorbitol today.   3 Atrial fibrillation:  Paroxysmal - Had severe AF with RVR 7/22 s/p DC-CV x 2 - Seen by EP. Not a candidate for ablation  due to body habitus  (could consider AVN ablation and CRT, if needed)  - Back in AF with RVR post VAD. Failed RAP x 3.  - 10/23 S/P DC-CV with restoration of NSR.  - warfarin per CT surgery . INR 2.3    4. CKD stage 3B  - Has solitary kidney.   - Baseline creatinine is 1.6-2.9.  - SCr 1.39 -> 1.47>1.6 today    5. OSA - Continue CPAP   6. DM2. Poorly controlled - On Jardiance. - Hgb A1c 9.4. - Cover with SSI   7. Hematuria - MRI and cystoscopy ok   8. Polyarticular gout. - severe - Needs to restart prednisone today. Give 40 mg prednisone daily x2 days.  - Continue colchicine daily      I reviewed theLVAD parameters from today, and compared the results to the patient's prior recorded data.  No programming changes were made.  The LVAD is functioning within specified parameters.  The patient performs LVAD self-test daily.  LVAD interrogation was negative for any significant power changes, alarms or PI events/speed drops.  LVAD equipment check completed and is in good working order.  Back-up equipment present.   LVAD education done on emergency procedures and precautions and reviewed exit site care.  Length of Stay: Amanda, Jeffrey [name] 07/28/2021, 7:26 AM  VAD Team --- VAD ISSUES ONLY--- Pager [phone] (7am - 7am)  Advanced Heart Failure Team  Pager [phone] (M-F; 7a - 5p)  Please contact [facility] for night-coverage after hours (5p -7a ) and weekends on [email or web address]  Patient seen and examined with the above-signed Advanced Practice Provider and/or Housestaff. I personally reviewed laboratory data, imaging studies and relevant notes. I independently examined the patient and formulated the important aspects of the plan. I have edited the note to reflect any of my changes or salient points. I have personally discussed the plan with the patient and/or family.  Back in NSR today after DC-CV. On lasix gtt at 20 but remains volume overloaded. Co-ox 71%  MAPS high. Gout pain  persists  General:  Sitting in chair  HEENT: normal  Neck: supple. JVP up Carotids 2+ bilat; no bruits. No lymphadenopathy or thryomegaly appreciated. Cor: sternal dressing ok LVAD hum.  Lungs: Clear. Abdomen: obese soft, nontender, non-distended. No hepatosplenomegaly. No bruits or masses. Good bowel sounds. Driveline site clean. Anchor in place.  Extremities: no cyanosis, clubbing, rash. Warm 1+ edema  Neuro: alert & oriented x 3. No focal deficits. Moves all 4 without problem   He is back n NSR. Continue milrinone while diuresing Continue IV amio. Agree with Bidil. Aggressive bowel regimen today. Ambulate. VAD interrogated personally. Parameters stable. INR ok. Discussed dosing with PharmD personally.  Jeffrey Bickers, MD  9:32 AM

## 2021-07-28 NOTE — Progress Notes (Signed)
Drive Line Dressing Change   Existing VAD dressing removed and site care performed using sterile technique. Small amount of dried blood at drive line exit site. No redness, tenderness, or foul odor noted. Drive line exit site cleaned with Chlora prep applicators x 2, allowed to dry, silver strip and guaze dressing applied.  Drive line anchors x 2 secure. Education provided to patient throughout procedure. Daily dressing changes with next dressing change due 07/28/21.

## 2021-07-28 NOTE — Progress Notes (Signed)
Occupational Therapy Treatment Patient Details Name: Jeffrey [name] MRN: [id number] DOB: 07/20/1976 Today's Date: 07/28/2021   History of present illness 45 y.o. male with class IV HF admitted 07/18/21 for pre-VAD optimization; has been relatively stable as outpatient with NYHA 3B symptoms. S/p RHC with swan placement 10/14. S/p HM3 LVAD implant on 10/17. Hypoxia overnight 10/19 requiring bipap. PMH includes poorly controlled HTN, renal cell carcinoma s/p nephrectomy, CKD3, DM2, OSA, gout, morbid obesity, HF (EF<20%).   OT comments  Upon arrival, pt supine in bed and very motivated to perform OOB activity. Pt activity tolerance decreased due to gout flare with pain at LUE and BLEs. Pt performing bed mobility with Max A +2. Pt performing sit<>stand with Mod A +2 and then stand pivot to recliner with Min Guard A and RW. VSS throughout on 5L O2. Continue to recommend dc to home and will continue to follow acutely as admitted.    Recommendations for follow up therapy are one component of a multi-disciplinary discharge planning process, led by the attending physician.  Recommendations may be updated based on patient status, additional functional criteria and insurance authorization.    Follow Up Recommendations  No OT follow up    Assistance Recommended at Discharge Intermittent Supervision/Assistance  Equipment Recommendations  BSC (bariatric)    Recommendations for Other Services      Precautions / Restrictions Precautions Precautions: Fall;Sternal;Other (comment) Precaution Comments: LVAD. Reviewing sternal precautions       Mobility Bed Mobility Overal bed mobility: Needs Assistance Bed Mobility: Supine to Sit     Supine to sit: Max assist;+2 for physical assistance;HOB elevated     General bed mobility comments: elevating HOB. pivoting hisp with bed pad and supporting pt's back.    Transfers Overall transfer level: Needs assistance Equipment used: Rolling walker (2  wheels) Transfers: Sit to/from Stand Sit to Stand: Mod assist;+2 physical assistance Stand pivot transfers: Min guard;+2 physical assistance;+2 safety/equipment         General transfer comment: Mod A for power up. Use of bed pad to assist with weight shift hip forward. Min Guard A for safetyduring pivot to recliner. Cues for RW management     Balance Overall balance assessment: Needs assistance Sitting-balance support: No upper extremity supported;Feet supported Sitting balance-Leahy Scale: Fair     Standing balance support: Bilateral upper extremity supported;During functional activity Standing balance-Leahy Scale: Poor Standing balance comment: Reliant on UE support due to pain at R knee                           ADL either performed or assessed with clinical judgement   ADL Overall ADL's : Needs assistance/impaired                     Lower Body Dressing: Total assistance;Bed level Lower Body Dressing Details (indicate cue type and reason): Don socks Toilet Transfer: Stand-pivot;Rolling walker (2 wheels);Moderate assistance;+2 for physical assistance;Min guard (simulated with recliner) Toilet Transfer Details (indicate cue type and reason): Mod A for power up into standing. Min Guard A for safety during pivot         Functional mobility during ADLs: Min guard;Rolling walker (2 wheels) General ADL Comments: Pt motivated to get OOB and sit in recliner. Very limited by gout pain.     Vision       Perception     Praxis      Cognition Arousal/Alertness: Awake/alert Behavior During Therapy: [facility] for  tasks assessed/performed Overall Cognitive Status: Within Functional Limits for tasks assessed                                 General Comments: Motivated despite gout pain          Exercises     Shoulder Instructions       General Comments VSS. BP slightly elevated. RN present throughout    Pertinent Vitals/ Pain       Pain  Assessment: Faces Faces Pain Scale: Hurts whole lot Pain Location: Knees, elbows, hand, fingers (gout) Pain Descriptors / Indicators: Discomfort;Guarding;Grimacing;Moaning Pain Intervention(s): Monitored during session;Limited activity within patient's tolerance;Repositioned;Premedicated before session  Home Living                                          Prior Functioning/Environment              Frequency  Min 2X/week        Progress Toward Goals  OT Goals(current goals can now be found in the care plan section)  Progress towards OT goals: Not progressing toward goals - comment (Gout flare)  Acute Rehab OT Goals OT Goal Formulation: With patient Time For Goal Achievement: 08/06/21 Potential to Achieve Goals: Good ADL Goals Pt Will Perform Grooming: with supervision;standing Pt Will Perform Upper Body Bathing: with supervision;with adaptive equipment Pt Will Perform Lower Body Bathing: with supervision;with adaptive equipment;sit to/from stand Pt Will Perform Upper Body Dressing: with supervision;sitting Pt Will Transfer to Toilet: with supervision;ambulating;bedside commode Pt Will Perform Toileting - Clothing Manipulation and hygiene: with supervision;sit to/from stand;with adaptive equipment Additional ADL Goal #1: Pt will change LVAD power sources and manage LVAD vest with supervision. Additional ADL Goal #2: Pt will generalize energy conservation strategies in ADL independently.  Plan Discharge plan remains appropriate    Co-evaluation    PT/OT/SLP Co-Evaluation/Treatment: Yes Reason for Co-Treatment: Complexity of the patient's impairments (multi-system involvement);Other (comment) (gout flare up)   OT goals addressed during session: ADL's and self-care      AM-PAC OT "6 Clicks" Daily Activity     Outcome Measure   Help from another person eating meals?: A Little Help from another person taking care of personal grooming?: A Little Help  from another person toileting, which includes using toliet, bedpan, or urinal?: Total Help from another person bathing (including washing, rinsing, drying)?: A Lot Help from another person to put on and taking off regular upper body clothing?: A Lot Help from another person to put on and taking off regular lower body clothing?: Total 6 Click Score: 12    End of Session Equipment Utilized During Treatment: Rolling walker (2 wheels);Oxygen (5L)  OT Visit Diagnosis: Unsteadiness on feet (R26.81);Other abnormalities of gait and mobility (R26.89);Pain;Muscle weakness (generalized) (M62.81) Pain - Right/Left: Right Pain - part of body: Knee   Activity Tolerance Patient tolerated treatment well;Patient limited by pain   Patient Left in chair;with call [place]/phone within reach;with nursing/sitter in room   Nurse Communication Mobility status        Time: 9470-9628 OT Time Calculation (min): 23 min  Charges: OT General Charges $OT Visit: 1 Visit OT Treatments $Therapeutic Activity: 8-22 mins  [place], OTR/L Acute Rehab Pager: [phone] Office: [place] 07/28/2021, 12:26 PM

## 2021-07-28 NOTE — Plan of Care (Signed)
  Problem: Education: Goal: Knowledge of General Education information will improve Description: Including pain rating scale, medication(s)/side effects and non-pharmacologic comfort measures Outcome: Progressing   Problem: Health Behavior/Discharge Planning: Goal: Ability to manage health-related needs will improve Outcome: Progressing   Problem: Clinical Measurements: Goal: Ability to maintain clinical measurements within normal limits will improve Outcome: Progressing Goal: Will remain free from infection Outcome: Progressing Goal: Diagnostic test results will improve Outcome: Progressing Goal: Respiratory complications will improve Outcome: Progressing Goal: Cardiovascular complication will be avoided Outcome: Progressing   Problem: Activity: Goal: Risk for activity intolerance will decrease Outcome: Progressing   Problem: Nutrition: Goal: Adequate nutrition will be maintained Outcome: Progressing   Problem: Coping: Goal: Level of anxiety will decrease Outcome: Progressing   Problem: Pain Managment: Goal: General experience of comfort will improve Outcome: Progressing   Problem: Safety: Goal: Ability to remain free from injury will improve Outcome: Progressing   Problem: Skin Integrity: Goal: Risk for impaired skin integrity will decrease Outcome: Progressing   Problem: Activity: Goal: Risk for activity intolerance will decrease Outcome: Progressing   Problem: Cardiac: Goal: Ability to maintain an adequate cardiac output will improve Outcome: Progressing   Problem: Coping: Goal: Level of anxiety will decrease Outcome: Progressing   Problem: Fluid Volume: Goal: Risk for excess fluid volume will decrease Outcome: Progressing   Problem: Clinical Measurements: Goal: Ability to maintain clinical measurements within normal limits will improve Outcome: Progressing Goal: Will remain free from infection Outcome: Progressing   Problem: Respiratory: Goal:  Will regain and/or maintain adequate ventilation Outcome: Progressing

## 2021-07-28 NOTE — Progress Notes (Signed)
CSW met with patient at bedside. Patient was out of bed and in the chair although reports struggling with gout today. Patient provided paperwork from [employer] stating that he was denies for SSDI due to no work history. Patient has a current SSI application still pending with Social Security. Patient verbalizes understanding of follow up with SSI and inform CSW if any new paperwork is mailed to his home. CSW will continue to follow for supportive needs and SSI follow up. Raquel Sarna, [place], [place]

## 2021-07-29 DIAGNOSIS — I4891 Unspecified atrial fibrillation: Secondary | ICD-10-CM | POA: Diagnosis not present

## 2021-07-29 DIAGNOSIS — I5023 Acute on chronic systolic (congestive) heart failure: Secondary | ICD-10-CM | POA: Diagnosis not present

## 2021-07-29 DIAGNOSIS — Z95811 Presence of heart assist device: Secondary | ICD-10-CM | POA: Diagnosis not present

## 2021-07-29 LAB — LACTATE DEHYDROGENASE: LDH: 225 U/L — ABNORMAL HIGH (ref 98–192)

## 2021-07-29 LAB — COOXEMETRY PANEL
Carboxyhemoglobin: 1.2 % (ref 0.5–1.5)
Methemoglobin: 0.9 % (ref 0.0–1.5)
O2 Saturation: 62.1 %
Total hemoglobin: 10.6 g/dL — ABNORMAL LOW (ref 12.0–16.0)

## 2021-07-29 LAB — GLUCOSE, CAPILLARY
Glucose-Capillary: 139 mg/dL — ABNORMAL HIGH (ref 70–99)
Glucose-Capillary: 145 mg/dL — ABNORMAL HIGH (ref 70–99)
Glucose-Capillary: 159 mg/dL — ABNORMAL HIGH (ref 70–99)
Glucose-Capillary: 180 mg/dL — ABNORMAL HIGH (ref 70–99)
Glucose-Capillary: 191 mg/dL — ABNORMAL HIGH (ref 70–99)
Glucose-Capillary: 191 mg/dL — ABNORMAL HIGH (ref 70–99)
Glucose-Capillary: 198 mg/dL — ABNORMAL HIGH (ref 70–99)

## 2021-07-29 LAB — CBC
HCT: 30.9 % — ABNORMAL LOW (ref 39.0–52.0)
Hemoglobin: 10.4 g/dL — ABNORMAL LOW (ref 13.0–17.0)
MCH: 24.1 pg — ABNORMAL LOW (ref 26.0–34.0)
MCHC: 33.7 g/dL (ref 30.0–36.0)
MCV: 71.5 fL — ABNORMAL LOW (ref 80.0–100.0)
Platelets: 432 10*3/uL — ABNORMAL HIGH (ref 150–400)
RBC: 4.32 MIL/uL (ref 4.22–5.81)
RDW: 16.2 % — ABNORMAL HIGH (ref 11.5–15.5)
WBC: 17.7 10*3/uL — ABNORMAL HIGH (ref 4.0–10.5)
nRBC: 0.7 % — ABNORMAL HIGH (ref 0.0–0.2)

## 2021-07-29 LAB — BASIC METABOLIC PANEL
Anion gap: 10 (ref 5–15)
BUN: 53 mg/dL — ABNORMAL HIGH (ref 6–20)
CO2: 28 mmol/L (ref 22–32)
Calcium: 10.8 mg/dL — ABNORMAL HIGH (ref 8.9–10.3)
Chloride: 95 mmol/L — ABNORMAL LOW (ref 98–111)
Creatinine, Ser: 1.66 mg/dL — ABNORMAL HIGH (ref 0.61–1.24)
GFR, Estimated: 51 mL/min — ABNORMAL LOW (ref >60)
Glucose, Bld: 163 mg/dL — ABNORMAL HIGH (ref 70–99)
Potassium: 4.2 mmol/L (ref 3.5–5.1)
Sodium: 133 mmol/L — ABNORMAL LOW (ref 135–145)

## 2021-07-29 LAB — PROTIME-INR
INR: 1.9 — ABNORMAL HIGH (ref 0.8–1.2)
Prothrombin Time: 21.9 seconds — ABNORMAL HIGH (ref 11.4–15.2)

## 2021-07-29 MED ORDER — CHLORHEXIDINE GLUCONATE 0.12 % MT SOLN
OROMUCOSAL | Status: AC
  Administered 2021-07-29: 15 mL via OROMUCOSAL
  Filled 2021-07-29: qty 15

## 2021-07-29 MED ORDER — METHYLNALTREXONE BROMIDE 12 MG/0.6ML ~~LOC~~ SOLN
12.0000 mg | SUBCUTANEOUS | Status: AC | PRN
Start: 2021-07-29 — End: 2021-07-29
  Administered 2021-07-29: 12 mg via SUBCUTANEOUS
  Filled 2021-07-29: qty 0.6

## 2021-07-29 MED ORDER — WARFARIN SODIUM 2 MG PO TABS
2.0000 mg | ORAL_TABLET | Freq: Once | ORAL | Status: AC
  Administered 2021-07-29: 2 mg via ORAL
  Filled 2021-07-29: qty 1

## 2021-07-29 MED ORDER — ISOSORB DINITRATE-HYDRALAZINE 20-37.5 MG PO TABS
2.0000 | ORAL_TABLET | Freq: Three times a day (TID) | ORAL | Status: DC
  Administered 2021-07-29 – 2021-08-08 (×32): 2 via ORAL
  Filled 2021-07-29 (×33): qty 2

## 2021-07-29 MED ORDER — GABAPENTIN 300 MG PO CAPS
300.0000 mg | ORAL_CAPSULE | Freq: Every day | ORAL | Status: DC
Start: 2021-07-29 — End: 2021-08-06
  Administered 2021-07-29 – 2021-08-06 (×9): 300 mg via ORAL
  Filled 2021-07-29 (×9): qty 1

## 2021-07-29 MED ORDER — SENNA 8.6 MG PO TABS
1.0000 | ORAL_TABLET | Freq: Two times a day (BID) | ORAL | Status: DC
  Administered 2021-07-29 – 2021-08-01 (×4): 8.6 mg via ORAL
  Filled 2021-07-29 (×10): qty 1

## 2021-07-29 NOTE — Progress Notes (Signed)
Advanced Heart Failure VAD Team Note  PCP-Cardiologist: Glori Bickers, MD   Subjective:    10/17: HM3 LVAD implant (DT)  10/18: Extubated 10/23: DC-CV for A fib RVR--> NSR. Milrinone cut back to 0.125 mcg.    Remains on amio drip+ milrinone 0.125 mcg and lasix at 20/hr   CO-OX 62%.  Weifght down 3 pound. CVP 9-10  MAPs 90-110  Still c/o joint pain + pocket pain. No dyspnea. Still no BM. Passing gas   LVAD INTERROGATION:  HeartMate III LVAD:   Flow 4.8 liters/min, speed 5900, power 5.0  PI 3..3. Rate PI events.   Objective:    Vital Signs:   Temp:  [97.8 F (36.6 C)-98.6 F (37 C)] 97.8 F (36.6 C) (10/25 0330) Pulse Rate:  [78-179] 122 (10/25 0600) Resp:  [14-34] 17 (10/25 0600) BP: (100-154)/(76-131) 128/90 (10/25 0600) SpO2:  [86 %-100 %] 88 % (10/25 0600) FiO2 (%):  [40 %] 40 % (10/24 2025) Weight:  [615 kg] 136 kg (10/25 0600) Last BM Date: 07/18/21 Mean arterial Pressure  90-110    Intake/Output:   Intake/Output Summary (Last 24 hours) at 07/29/2021 0643 Last data filed at 07/29/2021 0600 Gross per 24 hour  Intake 1955.97 ml  Output 3805 ml  Net -1849.03 ml      Physical Exam   General:  Sitting in chair NAD.  HEENT: normal  Neck: supple. JVP 9-10  Carotids 2+ bilat; no bruits. No lymphadenopathy or thryomegaly appreciated. Cor: LVAD hum.  Lungs: Clear. Abdomen: obese soft, nontender, non-distended. No hepatosplenomegaly. No bruits or masses. Good bowel sounds. Driveline site clean. Anchor in place.  Extremities: no cyanosis, clubbing, rash. Warm no edema Joints sore Neuro: alert & oriented x 3. No focal deficits. Moves all 4 without problem    Telemetry   NSR 80s Personally reviewed    Labs   Basic Metabolic Panel: Recent Labs  Lab 07/24/21 0350 07/24/21 0401 07/25/21 0323 07/25/21 0436 07/26/21 0344 07/26/21 1707 07/27/21 0359 07/27/21 1712 07/28/21 0351 07/29/21 0319  NA 133*   < > 135   < > 135 130* 135 131* 132* 133*  K  4.4   < > 3.9   < > 3.2* 3.8 3.5 4.1 3.7 4.2  CL 101  --  102   < > 99 96* 98 96* 94* 95*  CO2 24  --  26   < > _0 GLUCOSE 134*  --  131*   < > 137* 322* 131* 291* 169* 163*  BUN 30*  --  31*   < > 31* 32* 34* 38* 41* 53*  CREATININE 1.66*  --  1.47*   < > 1.39* 1.36* 1.47* 1.68* 1.59* 1.66*  CALCIUM 10.6*  --  10.2   < > 10.3 9.5 10.3 10.1 10.5* 10.8*  MG 2.3  --  2.2  --  1.9  --  2.0  --  2.0  --   PHOS 5.0*  --  3.8  --  3.4  --  3.6  --  4.4  --    < > = values in this interval not displayed.     Liver Function Tests: Recent Labs  Lab 07/23/21 0424 07/24/21 0350  AST 45* 26  ALT 19 13  ALKPHOS 74 72  BILITOT 1.1 0.7  PROT 6.5 6.5  ALBUMIN 2.5* 2.5*    No results for input(s): LIPASE, AMYLASE in the last 168 hours. No results for input(s): AMMONIA in the  last 168 hours.  CBC: Recent Labs  Lab 07/24/21 0350 07/24/21 0401 07/25/21 0323 07/25/21 0436 07/26/21 0344 07/27/21 0359 07/28/21 0351 07/29/21 0319  WBC 19.4*  --  12.8*  --  11.6* 13.1* 17.1* 17.7*  NEUTROABS 15.2*  --  9.4*  --  7.5 8.5* 12.6*  --   HGB 10.6*   < > 10.1* 10.9* 10.0* 9.9* 10.7* 10.4*  HCT 34.3*   < > 32.4* 32.0* 32.0* 31.3* 32.0* 30.9*  MCV 79.2*  --  77.7*  --  76.2* 75.4* 72.7* 71.5*  PLT 225  --  235  --  309 391 403* 432*   < > = values in this interval not displayed.     INR: Recent Labs  Lab 07/25/21 0323 07/26/21 0344 07/27/21 0359 07/28/21 0351 07/29/21 0319  INR 2.0* 3.3* 2.9* 2.3* 1.9*     Other results:   Imaging   No results found.   Medications:     Scheduled Medications:  allopurinol  100 mg Oral Daily   alteplase  2 mg Intracatheter Once   amiodarone  150 mg Intravenous Once   bisacodyl  10 mg Oral Daily   Or   bisacodyl  10 mg Rectal Daily   chlorhexidine gluconate (MEDLINE KIT)  15 mL Mouth Rinse BID   Chlorhexidine Gluconate Cloth  6 each Topical Daily   colchicine  0.6 mg Oral Daily   docusate sodium  200 mg Oral Daily    insulin aspart  0-20 Units Subcutaneous Q4H   insulin detemir  30 Units Subcutaneous BID   isosorbide-hydrALAZINE  1 tablet Oral TID   mouth rinse  15 mL Mouth Rinse BID   pantoprazole  40 mg Oral Daily   polyethylene glycol  17 g Oral BID   predniSONE  40 mg Oral Q breakfast   senna  1 tablet Oral Daily   sertraline  25 mg Oral Daily   sodium chloride flush  10-40 mL Intracatheter Q12H   sodium chloride flush  3 mL Intravenous Q12H   spironolactone  25 mg Oral Daily   traZODone  100 mg Per Tube QHS   Warfarin - Pharmacist Dosing Inpatient   Does not apply q1600    Infusions:  sodium chloride Stopped (07/28/21 2158)   amiodarone 30 mg/hr (07/29/21 0600)   furosemide (LASIX) 200 mg in dextrose 5% 100 mL (64m/mL) infusion 20 mg/hr (07/29/21 0600)   milrinone 0.125 mcg/kg/min (07/29/21 0600)    PRN Medications: sodium chloride, morphine injection, ondansetron (ZOFRAN) IV, oxyCODONE, sodium chloride flush, sodium chloride flush, traMADol   Assessment/Plan:    1. Acute on Chronic systolic CHF - Nonischemic CM, end-stage - Echo (9/21): EF 20-25%.   - Echo  8/22: EF < 20%, severe LV dilation, moderate RV dysfunction.   - RHC (8/22): well compensated hemodynamics. - s/p S-ICD - NYHA IV  - Body mass index is 45.42 kg/m. Not transplant candidate - S/p HM3 LVAD 10/17 (DT) - On milrinone 0.125. Co-ox 62%. Back in NSR.  Continue milrinone while diuresing.  - CVP 9-10. Continue lasix drip 20 mg per hour for one more day  2. VAD  - s/p HM-3 placement 10/17 - LDH 243 - Maps 90-110 increase bidil to 2 bid --Continue IV lasix.  - VAD interrogated personally. Parameters stable. - Ramp echo wednesday - INR 1.9 Personally reviewed - No BM x 8 days despite senna+ sorbitol. Will add relastor. If no BM will need enema soon   - Add neurontin for  pocket pain  3 Atrial fibrillation: Paroxysmal - Had severe AF with RVR 7/22 s/p DC-CV x 2 - Seen by EP. Not a candidate for ablation due to  body habitus  (could consider AVN ablation and CRT, if needed)  - Back in AF with RVR post VAD. Failed RAP x 3.  - 10/23 S/P DC-CV with restoration of NSR.  - warfarin per CT surgery . INR 1.9   4. CKD stage 3B  - Has solitary kidney.   - Baseline creatinine is 1.6-2.9.  - SCr 1.39 -> 1.47>1.6 > 1.66 today    5. OSA - Continue CPAP   6. DM2. Poorly controlled - On Jardiance. - Hgb A1c 9.4. - Cover with SSI   7. Hematuria - MRI and cystoscopy ok   8. Polyarticular gout. - severe - Continue prednisone - Continue colchicine daily     I reviewed theLVAD parameters from today, and compared the results to the patient's prior recorded data.  No programming changes were made.  The LVAD is functioning within specified parameters.  The patient performs LVAD self-test daily.  LVAD interrogation was negative for any significant power changes, alarms or PI events/speed drops.  LVAD equipment check completed and is in good working order.  Back-up equipment present.   LVAD education done on emergency procedures and precautions and reviewed exit site care.  Length of Stay: Beecher, MD 07/29/2021, 6:43 AM  VAD Team --- VAD ISSUES ONLY--- Pager [phone] (7am - 7am)  Advanced Heart Failure Team  Pager [phone] (M-F; 7a - 5p)  Please contact [facility] for night-coverage after hours (5p -7a ) and weekends on [email or web address]

## 2021-07-29 NOTE — Progress Notes (Signed)
ANTICOAGULATION CONSULT NOTE - Initial Consult  Pharmacy Consult for warfarin Indication:  LVAD  Allergies  Allergen Reactions   Aspirin Shortness Of Breath, Itching and Rash     Burning sensation (Patient reports he tolerates other NSAIDS)    Bee Venom Hives and Swelling   Lisinopril Cough   Shrimp (Diagnostic) Rash   Tomato Rash    Patient Measurements: Height: '5\' 7"'  (170.2 cm) Weight: (!) 137.4 kg (302 lb 14.6 oz) IBW/kg (Calculated) : 66.1  Vital Signs: Temp: 97.8 F (36.6 C) (10/25 0330) Temp Source: Oral (10/25 0330) BP: 128/90 (10/25 0600) Pulse Rate: 122 (10/25 0600)  Labs: Recent Labs    07/27/21 0359 07/27/21 1712 07/28/21 0351 07/29/21 0319  HGB 9.9*  --  10.7* 10.4*  HCT 31.3*  --  32.0* 30.9*  PLT 391  --  403* 432*  LABPROT 30.0*  --  25.3* 21.9*  INR 2.9*  --  2.3* 1.9*  CREATININE 1.47* 1.68* 1.59* 1.66*     Estimated Creatinine Clearance: 74.4 mL/min (A) (by C-G formula based on SCr of 1.66 mg/dL (H)).   Medical History: Past Medical History:  Diagnosis Date   Anxiety    Aspirin allergy    Childhood asthma    Chronic systolic CHF (congestive heart failure) ([place])    a. EF 20-25% in 2012. b. EF 45-50% in 10/2011 with nonischemic nuc - presumed NICM. c. 12/2014 Echo: Sev depressed LV fxn, sev dil LV, mild LVH, mild MR, sev dil LA, mildly reduced RV fxn.   CKD (chronic kidney disease) stage 2, GFR 60-89 ml/min    H/O vasectomy 12/2019   High cholesterol    Hypertension    Morbid obesity ([place])    Nephrolithiasis    OSA on CPAP    Paroxysmal atrial fibrillation ([place])    Presumed NICM    a. 04/2014 Myoview: EF 26%, glob HK, sev glob HK, ? prior infarct;  b. Never cathed 2/2 CKD.   Renal cell carcinoma ([place])    a. s/p Rt robotic assisted partial converted to radical nephrectomy on 01/2013.   Troponin level elevated    a. 04/2014, 12/2014: felt due to CHF.   Type II diabetes mellitus (HCC)    Ventricular tachycardia    a. appropriate ICD therapy  12/2017    Medications:  Medications Prior to Admission  Medication Sig Dispense Refill Last Dose   acetaminophen (TYLENOL) 325 MG tablet Take 2 tablets (650 mg total) by mouth every 6 (six) hours as needed for fever.   Past Week   allopurinol (ZYLOPRIM) 100 MG tablet Take 1 tablet (100 mg total) by mouth daily. 90 tablet 3 07/18/2021   ALPRAZolam (XANAX) 0.5 MG tablet Take 1 tablet (0.5 mg total) by mouth 2 (two) times daily. 60 tablet 4 Past Week   amiodarone (PACERONE) 200 MG tablet Take 0.5 tablets (100 mg total) by mouth daily. 30 tablet 3 07/17/2021   apixaban (ELIQUIS) 5 MG TABS tablet Take 1 tablet (5 mg total) by mouth 2 (two) times daily. 60 tablet 3 07/14/2021   diclofenac Sodium (VOLTAREN) 1 % GEL Apply 2 g topically 4 (four) times daily. 100 g 0 Past Week   docusate sodium (COLACE) 100 MG capsule Take 1 capsule (100 mg total) by mouth 2 (two) times daily. 10 capsule 0 Past Week   empagliflozin (JARDIANCE) 10 MG TABS tablet TAKE 1 TABLET (10 MG TOTAL) BY MOUTH DAILY BEFORE BREAKFAST. 90 tablet 3 07/18/2021   insulin aspart protamine - aspart (  NOVOLOG 70/30 MIX) (70-30) 100 UNIT/ML FlexPen Inject 38 Units into the skin 2 (two) times daily. 15 mL 11 07/17/2021   Insulin Syringe-Needle U-100 (TRUEPLUS INSULIN SYRINGE) 31G X 5/16" 0.5 ML MISC USE TO INJECT INSULIN TWICE DAILY. 100 each 11 07/17/2021   losartan (COZAAR) 25 MG tablet Take 0.5 tablets (12.5 mg total) by mouth daily. 45 tablet 3 07/18/2021   metFORMIN (GLUCOPHAGE) 1000 MG tablet Take 1 tablet (1,000 mg total) by mouth 2 (two) times daily with a meal. 60 tablet 11 07/18/2021   pantoprazole (PROTONIX) 40 MG tablet Take 1 tablet (40 mg total) by mouth daily. 30 tablet 0 Past Week   polyethylene glycol (MIRALAX / GLYCOLAX) 17 g packet Take 17 g by mouth daily. 14 each 0 Past Week   sertraline (ZOLOFT) 25 MG tablet Take 1 tablet (25 mg total) by mouth daily. 30 tablet 3 07/18/2021   spironolactone (ALDACTONE) 25 MG tablet Take 1  tablet (25 mg total) by mouth daily. 30 tablet 6 07/18/2021   torsemide (DEMADEX) 20 MG tablet Take 1 tablet (20 mg total) by mouth daily as needed (If wt gain > 3 lb in 24 hr or > 5 lb in 1 week). 30 tablet 2 Past Week   traMADol (ULTRAM) 50 MG tablet Take 2 tablets (100 mg total) by mouth every 12 (twelve) hours as needed for moderate pain. 30 tablet 0 Past Week   traZODone (DESYREL) 100 MG tablet Take 1 tablet (100 mg total) by mouth at bedtime. 30 tablet 0 07/17/2021   nitroGLYCERIN (NITROSTAT) 0.4 MG SL tablet Place 1 tablet (0.4 mg total) under the tongue every 5 (five) minutes x 3 doses as needed for chest pain. 25 tablet 12 Unknown   Scheduled:   allopurinol  100 mg Oral Daily   alteplase  2 mg Intracatheter Once   amiodarone  150 mg Intravenous Once   bisacodyl  10 mg Oral Daily   Or   bisacodyl  10 mg Rectal Daily   chlorhexidine gluconate (MEDLINE KIT)  15 mL Mouth Rinse BID   Chlorhexidine Gluconate Cloth  6 each Topical Daily   colchicine  0.6 mg Oral Daily   docusate sodium  200 mg Oral Daily   insulin aspart  0-20 Units Subcutaneous Q4H   insulin detemir  30 Units Subcutaneous BID   isosorbide-hydrALAZINE  1 tablet Oral TID   mouth rinse  15 mL Mouth Rinse BID   pantoprazole  40 mg Oral Daily   polyethylene glycol  17 g Oral BID   predniSONE  40 mg Oral Q breakfast   senna  1 tablet Oral Daily   sertraline  25 mg Oral Daily   sodium chloride flush  10-40 mL Intracatheter Q12H   sodium chloride flush  3 mL Intravenous Q12H   spironolactone  25 mg Oral Daily   traZODone  100 mg Per Tube QHS   Warfarin - Pharmacist Dosing Inpatient   Does not apply q1600   Infusions:   sodium chloride Stopped (07/28/21 2158)   amiodarone 30 mg/hr (07/29/21 0600)   furosemide (LASIX) 200 mg in dextrose 5% 100 mL (57m/mL) infusion 20 mg/hr (07/29/21 0600)   milrinone 0.125 mcg/kg/min (07/29/21 0600)    Assessment: Patient is status post LVAD placement on 10/17. Warfarin was initiated  on 10/18. Most recent INR this morning is 1.9 after giving 1 mg 10/23. Patient is also taking allopurinol, amiodarone, and prednisone. No bleeding reported. CBC stable. Will follow plans for pacing wire removal.  Goal of Therapy:  INR 2-2.5 Monitor platelets by anticoagulation protocol: Yes   Plan:  Give warfarin 2 mg today Daily INR  Thank you for allowing pharmacy to participate in this patient's care.  Reatha Harps, PharmD PGY1 Pharmacy Resident 07/29/2021 6:30 AM Check [email or web address] for unit specific pharmacy number

## 2021-07-29 NOTE — Progress Notes (Signed)
LVAD Coordinator Rounding Note:  Admitted 07/18/21 due to Dr. [name] for elective VAD placement.   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 Cyndia Bent under Destination Therapy criteria due to BMI and chronic kidney disease.   Pt sitting up in recliner upon my arrival. States that he is having issues with gout. Rates his pain 8/10. Recently received PRN pain medication. Currently taking colchicine and Prednisone. Reports he is having difficulty ambulating due to pain. Encouraged to be as mobile as possible.   Pacing wires were removed yesterday.   Remains on Lasix 10 mg/hr. Weight down 3 lbs today.   Pt reports he has been changing his VAD power sources from patient cable to batteries and back again; reports no problems. Able to verbalized importance of "1 power source at a time."   VAD education with patient's wife as documented below.   Pt has not had BM.   Vital signs: Temp:  97.8 HR: 90 NSR Doppler:  116 Auto cuff:  116/87 (97)  O2 Sat: 95% on 5L/[place] Wt:  277>292>285>296.3>305.5>305.5>302.9>299.8 lbs  LVAD interrogation reveals:  Speed: 5900 Flow:  4.6 Power:  4.6w PI: 4.2 Hct: 31  Alarms:  none Events: none  Fixed speed:  5900 Low speed limit: 5600  Drive Line: Dressing change performed by patient's wife with VAD coordinator supervision and verbal cues. Existing VAD dressing removed and site care performed using sterile technique. Drive line exit site cleaned with Chlora prep applicators x 2, allowed to dry, and gauze dressing with silver strip applied. Exit site with one suture intact, the velour is fully implanted at exit site; small amount bloody drainage on silver strip with slight redness, no tenderness, or foul odor noted. Small amount of serosanguinous drainage noted from site with cleansing.  Drive line anchor intact x 2. Will continue daily dressing changes per VAD Coordinator, Nurse Davonna Belling, or trained caregiver. Next dressing change due 07/30/21.    Labs:   LDH trend: 318>372>312>302>243>225  INR trend: 1.3>1.9>1.9>2.0>2.3>1.9  Anticoagulation Plan: -INR Goal: 2.0 - 2.5  -ASA Dose: none due to allergy  Gtts:  Amiodarone 30mg /hr Lasix 10 mg/hr Milrinone 0.125 mcg/kg/min  Blood Products:  - 07/21/21 2 units FFP (intra op)  Device: - [employer] single ICD -Therapies: off   Respiratory: Extubated 07/22/21  Nitric Oxide:  Off 07/22/21  Infection:  - sputum culture 07/23/21>>> specimen rejected - urine culture 07/23/21>>>no growth (final) - blood culture 07/23/21>>>no growth final  VAD Education: [name] (pt's wife) performed dressing change today with verbal cues provided by VAD coordinator. She was able to successfully don/doff sterile gloves. Plan for his wife to perform dressing change again Thursday with VAD coordinator supervision as her work schedule allows [facility] successfully performed power source change; verbalizing importance of "1 source at a time."  [name] successfully placed batteries in/out of clips. Demonstrated how to check battery life on battery Briefly discussed Charity fundraiser with [name] Patient's mother may come for VAD dressing change teaching tomorrow if she is not working Plan to continue education on Thursday with [name] as her work schedule allows   Plan/Recommendations:  Call VAD Coordinator if any VAD equipment or drive line issues. Daily dressing changes per VAD Coordinator, Nurse Davonna Belling, or trained caregiver.   [name] RN [place] Coordinator  Office: [phone]  24/7 Pager: [phone]

## 2021-07-29 NOTE — Progress Notes (Signed)
   Remains in SR.  Continue amio drip until off milrinone.   CVP 9 Continue lasix drip. BMET in am.   Remains constipated. On senna + sorbitol and relastor added today.   Warren Lacy Enio Hornback NP-C  /3:12 PM

## 2021-07-29 NOTE — Progress Notes (Signed)
RT note. Home unit all set up for the night for patient to use. Water added to home unit. RT will continue to monitor.

## 2021-07-30 ENCOUNTER — Inpatient Hospital Stay (HOSPITAL_COMMUNITY): Payer: Medicaid Other

## 2021-07-30 ENCOUNTER — Other Ambulatory Visit (HOSPITAL_COMMUNITY): Payer: Self-pay

## 2021-07-30 DIAGNOSIS — I5023 Acute on chronic systolic (congestive) heart failure: Secondary | ICD-10-CM

## 2021-07-30 DIAGNOSIS — Z95811 Presence of heart assist device: Secondary | ICD-10-CM | POA: Diagnosis not present

## 2021-07-30 LAB — CBC
HCT: 31.7 % — ABNORMAL LOW (ref 39.0–52.0)
Hemoglobin: 10.7 g/dL — ABNORMAL LOW (ref 13.0–17.0)
MCH: 23.8 pg — ABNORMAL LOW (ref 26.0–34.0)
MCHC: 33.8 g/dL (ref 30.0–36.0)
MCV: 70.6 fL — ABNORMAL LOW (ref 80.0–100.0)
Platelets: 450 10*3/uL — ABNORMAL HIGH (ref 150–400)
RBC: 4.49 MIL/uL (ref 4.22–5.81)
RDW: 16.2 % — ABNORMAL HIGH (ref 11.5–15.5)
WBC: 14 10*3/uL — ABNORMAL HIGH (ref 4.0–10.5)
nRBC: 0.8 % — ABNORMAL HIGH (ref 0.0–0.2)

## 2021-07-30 LAB — GLUCOSE, CAPILLARY
Glucose-Capillary: 134 mg/dL — ABNORMAL HIGH (ref 70–99)
Glucose-Capillary: 162 mg/dL — ABNORMAL HIGH (ref 70–99)
Glucose-Capillary: 64 mg/dL — ABNORMAL LOW (ref 70–99)
Glucose-Capillary: 190 mg/dL — ABNORMAL HIGH (ref 70–99)
Glucose-Capillary: 193 mg/dL — ABNORMAL HIGH (ref 70–99)
Glucose-Capillary: 159 mg/dL — ABNORMAL HIGH (ref 70–99)

## 2021-07-30 LAB — BASIC METABOLIC PANEL
Anion gap: 9 (ref 5–15)
BUN: 64 mg/dL — ABNORMAL HIGH (ref 6–20)
CO2: 30 mmol/L (ref 22–32)
Calcium: 10.8 mg/dL — ABNORMAL HIGH (ref 8.9–10.3)
Chloride: 94 mmol/L — ABNORMAL LOW (ref 98–111)
Creatinine, Ser: 1.77 mg/dL — ABNORMAL HIGH (ref 0.61–1.24)
GFR, Estimated: 47 mL/min — ABNORMAL LOW (ref >60)
Glucose, Bld: 195 mg/dL — ABNORMAL HIGH (ref 70–99)
Potassium: 4 mmol/L (ref 3.5–5.1)
Sodium: 133 mmol/L — ABNORMAL LOW (ref 135–145)

## 2021-07-30 LAB — ECHOCARDIOGRAM LIMITED
Height: 67 in
Weight: 4761.94 oz

## 2021-07-30 LAB — COOXEMETRY PANEL
Carboxyhemoglobin: 1.4 % (ref 0.5–1.5)
Methemoglobin: 0.9 % (ref 0.0–1.5)
O2 Saturation: 59.4 %
Total hemoglobin: 9.9 g/dL — ABNORMAL LOW (ref 12.0–16.0)

## 2021-07-30 LAB — PROTIME-INR
INR: 1.7 — ABNORMAL HIGH (ref 0.8–1.2)
Prothrombin Time: 19.6 seconds — ABNORMAL HIGH (ref 11.4–15.2)

## 2021-07-30 LAB — LACTATE DEHYDROGENASE: LDH: 211 U/L — ABNORMAL HIGH (ref 98–192)

## 2021-07-30 MED ORDER — PREDNISONE 20 MG PO TABS
40.0000 mg | ORAL_TABLET | Freq: Every day | ORAL | Status: DC
  Administered 2021-07-31 – 2021-08-06 (×7): 40 mg via ORAL
  Filled 2021-07-30 (×8): qty 2

## 2021-07-30 MED ORDER — SORBITOL 70 % SOLN
30.0000 mL | Freq: Once | Status: AC
  Administered 2021-07-30: 30 mL via ORAL
  Filled 2021-07-30: qty 30

## 2021-07-30 MED ORDER — ALTEPLASE 2 MG IJ SOLR
2.0000 mg | Freq: Once | INTRAMUSCULAR | Status: DC
  Filled 2021-07-30: qty 2

## 2021-07-30 MED ORDER — ACETAMINOPHEN 325 MG PO TABS
650.0000 mg | ORAL_TABLET | Freq: Four times a day (QID) | ORAL | Status: DC | PRN
  Administered 2021-07-30 – 2021-08-05 (×3): 650 mg via ORAL
  Filled 2021-07-30 (×3): qty 2

## 2021-07-30 MED ORDER — WARFARIN SODIUM 2.5 MG PO TABS
2.5000 mg | ORAL_TABLET | Freq: Once | ORAL | Status: AC
  Administered 2021-07-30: 2.5 mg via ORAL
  Filled 2021-07-30: qty 1

## 2021-07-30 MED ORDER — INSULIN ASPART 100 UNIT/ML IJ SOLN
0.0000 [IU] | Freq: Three times a day (TID) | INTRAMUSCULAR | Status: DC
  Administered 2021-07-30 – 2021-07-31 (×2): 4 [IU] via SUBCUTANEOUS
  Administered 2021-07-31: 3 [IU] via SUBCUTANEOUS
  Administered 2021-07-31: 4 [IU] via SUBCUTANEOUS
  Administered 2021-08-01: 3 [IU] via SUBCUTANEOUS
  Administered 2021-08-01: 11 [IU] via SUBCUTANEOUS
  Administered 2021-08-01: 4 [IU] via SUBCUTANEOUS
  Administered 2021-08-02 (×2): 7 [IU] via SUBCUTANEOUS
  Administered 2021-08-02: 4 [IU] via SUBCUTANEOUS
  Administered 2021-08-03: 7 [IU] via SUBCUTANEOUS
  Administered 2021-08-03: 15 [IU] via SUBCUTANEOUS
  Administered 2021-08-03: 4 [IU] via SUBCUTANEOUS
  Administered 2021-08-04: 7 [IU] via SUBCUTANEOUS

## 2021-07-30 MED ORDER — INSULIN ASPART 100 UNIT/ML IJ SOLN
0.0000 [IU] | Freq: Every day | INTRAMUSCULAR | Status: DC
Start: 2021-07-30 — End: 2021-08-08
  Administered 2021-07-31: 4 [IU] via SUBCUTANEOUS
  Administered 2021-08-01 – 2021-08-02 (×2): 3 [IU] via SUBCUTANEOUS
  Administered 2021-08-03: 4 [IU] via SUBCUTANEOUS
  Administered 2021-08-04 – 2021-08-05 (×2): 3 [IU] via SUBCUTANEOUS
  Administered 2021-08-06: 2 [IU] via SUBCUTANEOUS

## 2021-07-30 MED ORDER — FLEET ENEMA 7-19 GM/118ML RE ENEM
1.0000 | ENEMA | Freq: Once | RECTAL | Status: AC
  Administered 2021-07-30: 1 via RECTAL
  Filled 2021-07-30: qty 1

## 2021-07-30 NOTE — Progress Notes (Signed)
ANTICOAGULATION CONSULT NOTE - Initial Consult  Pharmacy Consult for warfarin Indication:  LVAD  Allergies  Allergen Reactions   Aspirin Shortness Of Breath, Itching and Rash     Burning sensation (Patient reports he tolerates other NSAIDS)    Bee Venom Hives and Swelling   Lisinopril Cough   Shrimp (Diagnostic) Rash   Tomato Rash    Patient Measurements: Height: 5\' 7"  (170.2 cm) Weight: 135 kg (297 lb 9.9 oz) IBW/kg (Calculated) : 66.1  Vital Signs: Temp: 97.7 F (36.5 C) (10/26 0400) Temp Source: Oral (10/26 0400) BP: 126/102 (10/26 0800) Pulse Rate: 41 (10/26 0800)  Labs: Recent Labs    07/28/21 0351 07/29/21 0319 07/30/21 0304  HGB 10.7* 10.4*  --   HCT 32.0* 30.9*  --   PLT 403* 432*  --   LABPROT 25.3* 21.9* 19.6*  INR 2.3* 1.9* 1.7*  CREATININE 1.59* 1.66* 1.77*     Estimated Creatinine Clearance: 69.1 mL/min (A) (by C-G formula based on SCr of 1.77 mg/dL (H)).   Medical History: Past Medical History:  Diagnosis Date   Anxiety    Aspirin allergy    Childhood asthma    Chronic systolic CHF (congestive heart failure) ([place])    a. EF 20-25% in 2012. b. EF 45-50% in 10/2011 with nonischemic nuc - presumed NICM. c. 12/2014 Echo: Sev depressed LV fxn, sev dil LV, mild LVH, mild MR, sev dil LA, mildly reduced RV fxn.   CKD (chronic kidney disease) stage 2, GFR 60-89 ml/min    H/O vasectomy 12/2019   High cholesterol    Hypertension    Morbid obesity ([place])    Nephrolithiasis    OSA on CPAP    Paroxysmal atrial fibrillation ([place])    Presumed NICM    a. 04/2014 Myoview: EF 26%, glob HK, sev glob HK, ? prior infarct;  b. Never cathed 2/2 CKD.   Renal cell carcinoma ([place])    a. s/p Rt robotic assisted partial converted to radical nephrectomy on 01/2013.   Troponin level elevated    a. 04/2014, 12/2014: felt due to CHF.   Type II diabetes mellitus (HCC)    Ventricular tachycardia    a. appropriate ICD therapy 12/2017    Medications:  Medications Prior to  Admission  Medication Sig Dispense Refill Last Dose   acetaminophen (TYLENOL) 325 MG tablet Take 2 tablets (650 mg total) by mouth every 6 (six) hours as needed for fever.   Past Week   allopurinol (ZYLOPRIM) 100 MG tablet Take 1 tablet (100 mg total) by mouth daily. 90 tablet 3 07/18/2021   ALPRAZolam (XANAX) 0.5 MG tablet Take 1 tablet (0.5 mg total) by mouth 2 (two) times daily. 60 tablet 4 Past Week   amiodarone (PACERONE) 200 MG tablet Take 0.5 tablets (100 mg total) by mouth daily. 30 tablet 3 07/17/2021   apixaban (ELIQUIS) 5 MG TABS tablet Take 1 tablet (5 mg total) by mouth 2 (two) times daily. 60 tablet 3 07/14/2021   diclofenac Sodium (VOLTAREN) 1 % GEL Apply 2 g topically 4 (four) times daily. 100 g 0 Past Week   docusate sodium (COLACE) 100 MG capsule Take 1 capsule (100 mg total) by mouth 2 (two) times daily. 10 capsule 0 Past Week   empagliflozin (JARDIANCE) 10 MG TABS tablet TAKE 1 TABLET (10 MG TOTAL) BY MOUTH DAILY BEFORE BREAKFAST. 90 tablet 3 07/18/2021   insulin aspart protamine - aspart (NOVOLOG 70/30 MIX) (70-30) 100 UNIT/ML FlexPen Inject 38 Units into the skin  2 (two) times daily. 15 mL 11 07/17/2021   Insulin Syringe-Needle U-100 (TRUEPLUS INSULIN SYRINGE) 31G X 5/16" 0.5 ML MISC USE TO INJECT INSULIN TWICE DAILY. 100 each 11 07/17/2021   losartan (COZAAR) 25 MG tablet Take 0.5 tablets (12.5 mg total) by mouth daily. 45 tablet 3 07/18/2021   metFORMIN (GLUCOPHAGE) 1000 MG tablet Take 1 tablet (1,000 mg total) by mouth 2 (two) times daily with a meal. 60 tablet 11 07/18/2021   pantoprazole (PROTONIX) 40 MG tablet Take 1 tablet (40 mg total) by mouth daily. 30 tablet 0 Past Week   polyethylene glycol (MIRALAX / GLYCOLAX) 17 g packet Take 17 g by mouth daily. 14 each 0 Past Week   sertraline (ZOLOFT) 25 MG tablet Take 1 tablet (25 mg total) by mouth daily. 30 tablet 3 07/18/2021   spironolactone (ALDACTONE) 25 MG tablet Take 1 tablet (25 mg total) by mouth daily. 30 tablet 6  07/18/2021   torsemide (DEMADEX) 20 MG tablet Take 1 tablet (20 mg total) by mouth daily as needed (If wt gain > 3 lb in 24 hr or > 5 lb in 1 week). 30 tablet 2 Past Week   traMADol (ULTRAM) 50 MG tablet Take 2 tablets (100 mg total) by mouth every 12 (twelve) hours as needed for moderate pain. 30 tablet 0 Past Week   traZODone (DESYREL) 100 MG tablet Take 1 tablet (100 mg total) by mouth at bedtime. 30 tablet 0 07/17/2021   nitroGLYCERIN (NITROSTAT) 0.4 MG SL tablet Place 1 tablet (0.4 mg total) under the tongue every 5 (five) minutes x 3 doses as needed for chest pain. 25 tablet 12 Unknown   Scheduled:   allopurinol  100 mg Oral Daily   alteplase  2 mg Intracatheter Once   amiodarone  150 mg Intravenous Once   bisacodyl  10 mg Rectal Daily   chlorhexidine gluconate (MEDLINE KIT)  15 mL Mouth Rinse BID   Chlorhexidine Gluconate Cloth  6 each Topical Daily   colchicine  0.6 mg Oral Daily   docusate sodium  200 mg Oral Daily   gabapentin  300 mg Oral Daily   insulin aspart  0-20 Units Subcutaneous Q4H   insulin detemir  30 Units Subcutaneous BID   isosorbide-hydrALAZINE  2 tablet Oral TID   mouth rinse  15 mL Mouth Rinse BID   pantoprazole  40 mg Oral Daily   polyethylene glycol  17 g Oral BID   senna  1 tablet Oral BID   sertraline  25 mg Oral Daily   sodium chloride flush  10-40 mL Intracatheter Q12H   sodium chloride flush  3 mL Intravenous Q12H   sorbitol  30 mL Oral Once   spironolactone  25 mg Oral Daily   traZODone  100 mg Per Tube QHS   Warfarin - Pharmacist Dosing Inpatient   Does not apply q1600   Infusions:   sodium chloride Stopped (07/29/21 1211)   amiodarone 30 mg/hr (07/30/21 0652)   furosemide (LASIX) 200 mg in dextrose 5% 100 mL (67m/mL) infusion 20 mg/hr (07/30/21 0400)   milrinone 0.125 mcg/kg/min (07/30/21 0400)    Assessment: Patient is status post LVAD placement on 10/17. Warfarin was initiated on 10/18. Most recent INR this morning decreased to 1.7 after  doubling dose to 2 mg on 10/25. Patient is also taking allopurinol, amiodarone, and prednisone. No bleeding reported. CBC stable. Will increase dose today.   Goal of Therapy:  INR 2-2.5 Monitor platelets by anticoagulation protocol: Yes  Plan:  Give warfarin 2.5 mg today Daily INR  Thank you for allowing pharmacy to participate in this patient's care.  Reatha Harps, PharmD PGY1 Pharmacy Resident 07/30/2021 8:09 AM Check [email or web address] for unit specific pharmacy number

## 2021-07-30 NOTE — Progress Notes (Addendum)
Advanced Heart Failure VAD Team Note  PCP-Cardiologist: Glori Bickers, MD   Subjective:    10/17: HM3 LVAD implant (DT)  10/18: Extubated 10/23: DC-CV for A fib RVR--> NSR. Milrinone cut back to 0.125 mcg.   10/25: Bidil increased to 2 tabs tid.   Remains on amio drip+ milrinone 0.125 mcg and lasix at 20/hr   CO-OX 59% .   Brisk diuresis noted. Weight down 2 pounds. Creatinine trending up 1.5>1.6>1.8   CVP in the chair 5-6 .  Complaining of R knee pain. Passing gas. [place] Scrape like he is going to have BM today.   LVAD INTERROGATION:  HeartMate III LVAD:   Flow 4.5 liters/min, speed 5900, power 4.5  PI 4.7  Rate PI events.   Objective:    Vital Signs:   Temp:  [97.7 F (36.5 C)-98.1 F (36.7 C)] 97.7 F (36.5 C) (10/26 0400) Pulse Rate:  [75-187] 82 (10/26 0600) Resp:  [16-33] 18 (10/26 0600) BP: (108-147)/(80-109) 129/106 (10/26 0600) SpO2:  [89 %-98 %] 93 % (10/26 0600) Weight:  [135 kg] 135 kg (10/26 0500) Last BM Date: 07/18/21 Mean arterial Pressure  100-110s .    Intake/Output:   Intake/Output Summary (Last 24 hours) at 07/30/2021 0736 Last data filed at 07/30/2021 0400 Gross per 24 hour  Intake 692.46 ml  Output 3350 ml  Net -2657.54 ml     Physical Exam  CVP 6  in the chair.  Physical Exam: GENERAL: No acute distress. HEENT: normal  NECK: Supple, JVP  6-7 .  2+ bilaterally, no bruits.  No lymphadenopathy or thyromegaly appreciated.   CARDIAC:  Mechanical heart sounds with LVAD hum present.  LUNGS:  Clear to auscultation bilaterally.  ABDOMEN:  Soft, round, nontender, positive bowel sounds x4.     LVAD exit site: well-healed and incorporated.  Dressing dry and intact.  No erythema or drainage.  Stabilization device present and accurately applied.  Driveline dressing is being changed daily per sterile technique. EXTREMITIES:  Warm and dry, no cyanosis, clubbing, rash. R and LLE unna boots. R/L knee tender. RUE PICC  NEUROLOGIC:  Alert and oriented x  3.    No aphasia.  No dysarthria.  Affect pleasant.     Telemetry  SR 80s personally reviewed.   Labs   Basic Metabolic Panel: Recent Labs  Lab 07/24/21 0350 07/24/21 0401 07/25/21 0323 07/25/21 0436 07/26/21 0344 07/26/21 1707 07/27/21 0359 07/27/21 1712 07/28/21 0351 07/29/21 0319 07/30/21 0304  NA 133*   < > 135   < > 135   < > 135 131* 132* 133* 133*  K 4.4   < > 3.9   < > 3.2*   < > 3.5 4.1 3.7 4.2 4.0  CL 101  --  102   < > 99   < > 98 96* 94* 95* 94*  CO2 24  --  26   < > 27   < > _0 GLUCOSE 134*  --  131*   < > 137*   < > 131* 291* 169* 163* 195*  BUN 30*  --  31*   < > 31*   < > 34* 38* 41* 53* 64*  CREATININE 1.66*  --  1.47*   < > 1.39*   < > 1.47* 1.68* 1.59* 1.66* 1.77*  CALCIUM 10.6*  --  10.2   < > 10.3   < > 10.3 10.1 10.5* 10.8* 10.8*  MG 2.3  --  2.2  --  1.9  --  2.0  --  2.0  --   --   PHOS 5.0*  --  3.8  --  3.4  --  3.6  --  4.4  --   --    < > = values in this interval not displayed.    Liver Function Tests: Recent Labs  Lab 07/24/21 0350  AST 26  ALT 13  ALKPHOS 72  BILITOT 0.7  PROT 6.5  ALBUMIN 2.5*   No results for input(s): LIPASE, AMYLASE in the last 168 hours. No results for input(s): AMMONIA in the last 168 hours.  CBC: Recent Labs  Lab 07/24/21 0350 07/24/21 0401 07/25/21 0323 07/25/21 0436 07/26/21 0344 07/27/21 0359 07/28/21 0351 07/29/21 0319  WBC 19.4*  --  12.8*  --  11.6* 13.1* 17.1* 17.7*  NEUTROABS 15.2*  --  9.4*  --  7.5 8.5* 12.6*  --   HGB 10.6*   < > 10.1* 10.9* 10.0* 9.9* 10.7* 10.4*  HCT 34.3*   < > 32.4* 32.0* 32.0* 31.3* 32.0* 30.9*  MCV 79.2*  --  77.7*  --  76.2* 75.4* 72.7* 71.5*  PLT 225  --  235  --  309 391 403* 432*   < > = values in this interval not displayed.    INR: Recent Labs  Lab 07/26/21 0344 07/27/21 0359 07/28/21 0351 07/29/21 0319 07/30/21 0304  INR 3.3* 2.9* 2.3* 1.9* 1.7*    Other results:   Imaging   No results found.   Medications:     Scheduled  Medications:  allopurinol  100 mg Oral Daily   alteplase  2 mg Intracatheter Once   amiodarone  150 mg Intravenous Once   bisacodyl  10 mg Rectal Daily   chlorhexidine gluconate (MEDLINE KIT)  15 mL Mouth Rinse BID   Chlorhexidine Gluconate Cloth  6 each Topical Daily   colchicine  0.6 mg Oral Daily   docusate sodium  200 mg Oral Daily   gabapentin  300 mg Oral Daily   insulin aspart  0-20 Units Subcutaneous Q4H   insulin detemir  30 Units Subcutaneous BID   isosorbide-hydrALAZINE  2 tablet Oral TID   mouth rinse  15 mL Mouth Rinse BID   pantoprazole  40 mg Oral Daily   polyethylene glycol  17 g Oral BID   senna  1 tablet Oral BID   sertraline  25 mg Oral Daily   sodium chloride flush  10-40 mL Intracatheter Q12H   sodium chloride flush  3 mL Intravenous Q12H   spironolactone  25 mg Oral Daily   traZODone  100 mg Per Tube QHS   Warfarin - Pharmacist Dosing Inpatient   Does not apply q1600    Infusions:  sodium chloride Stopped (07/29/21 1211)   amiodarone 30 mg/hr (07/30/21 0652)   furosemide (LASIX) 200 mg in dextrose 5% 100 mL (66m/mL) infusion 20 mg/hr (07/30/21 0400)   milrinone 0.125 mcg/kg/min (07/30/21 0400)    PRN Medications: sodium chloride, morphine injection, ondansetron (ZOFRAN) IV, oxyCODONE, sodium chloride flush, sodium chloride flush, traMADol   Assessment/Plan:    1. Acute on Chronic systolic CHF - Nonischemic CM, end-stage - Echo (9/21): EF 20-25%.   - Echo  8/22: EF < 20%, severe LV dilation, moderate RV dysfunction.   - RHC (8/22): well compensated hemodynamics. - s/p S-ICD - NYHA IV  - Body mass index is 45.42 kg/m. Not transplant candidate - S/p HM3 LVAD 10/17 (DT) - On milrinone 0.125. Co-ox 59% .  Maintaining SR.  - CVP trending down . Creatinine trending up. Cut back lasix drip to 10 mg daily  2. VAD  - s/p HM-3 placement 10/17 - LDH 211 - Maps 100s . Continue bidil to 2 bid --Continue IV lasix but will cut back today at 10 mg per hour.   - VAD interrogated personally. Parameters stable. - Ramp echo today.  - INR 1.7. Discussed with pharmacy.  - No BM x 9 days despite senna+ sorbitol. Continue relastor. If no BM will need enema soon   - Continue neurontin for pocket pain  3 Atrial fibrillation: Paroxysmal - Had severe AF with RVR 7/22 s/p DC-CV x 2 - Seen by EP. Not a candidate for ablation due to body habitus  (could consider AVN ablation and CRT, if needed)  - Back in AF with RVR post VAD. Failed RAP x 3.  - 10/23 S/P DC-CV with restoration of NSR. - Continue amio drip until milrinone stopped.   - warfarin per CT surgery . INR 1.7   4. CKD stage 3B  - Has solitary kidney.   - Baseline creatinine is 1.6-2.9.  - SCr 1.39 -> 1.47>1.6 > 1.66 >1.77 - Cut back lasix drip.    5. OSA - Continue CPAP   6. DM2. Poorly controlled - On Jardiance. - Hgb A1c 9.4. - Cover with SSI   7. Hematuria - MRI and cystoscopy ok   8. Polyarticular gout. - severe - Completed 4/4 days prednisone.   - Continue colchicine daily     I reviewed theLVAD parameters from today, and compared the results to the patient's prior recorded data.  No programming changes were made.  The LVAD is functioning within specified parameters.  The patient performs LVAD self-test daily.  LVAD interrogation was negative for any significant power changes, alarms or PI events/speed drops.  LVAD equipment check completed and is in good working order.  Back-up equipment present.   LVAD education done on emergency procedures and precautions and reviewed exit site care.  Length of Stay: [place], NP 07/30/2021, 7:36 AM  VAD Team --- VAD ISSUES ONLY--- Pager [phone] (7am - 7am)  Advanced Heart Failure Team  Pager [phone] (M-F; 7a - 5p)  Please contact [facility] for night-coverage after hours (5p -7a ) and weekends on [email or web address]  Patient seen and examined with the above-signed Advanced Practice Provider and/or Housestaff. I personally  reviewed laboratory data, imaging studies and relevant notes. I independently examined the patient and formulated the important aspects of the plan. I have edited the note to reflect any of my changes or salient points. I have personally discussed the plan with the patient and/or family.  Has diuresed well. Remains in NSR. Still struggling with gout pain and taking a lot of narcotics on top of steroids. No BM since surgery. Co-ox 59% on milrinone 0.125. Scr up 1.6 -> 1.8. IN 1.7  General:  Sitting in chair No resp difficulty HEENT: normal Neck: supple. no JVD. Carotids 2+ bilat; no bruits. No lymphadenopathy or thryomegaly appreciated. Cor: Sternal wound looks good PMI nondisplaced. Regular rate & rhythm. No rubs, gallops or murmurs. Lungs: clear Abdomen: obese soft, nontender, + distended. No hepatosplenomegaly. No bruits or masses. Good bowel sounds. Extremities: no cyanosis, clubbing, rash, edema Neuro: alert & orientedx3, cranial nerves grossly intact. moves all 4 extremities w/o difficulty. Affect pleasant  Stop lasix for now. Continue milrinone one more day.VAD interrogated personally. Parameters stable. Ramp echo today. Cut back pain meds. Consider  re-dosing Relistor. Continue to mobilize. INR 1.7 Discussed dosing with PharmD personally.  Glori Bickers, MD  9:10 AM

## 2021-07-30 NOTE — Progress Notes (Signed)
Assisted patient in administering his home CPAP.  Added water and oxygen bled in.  Pt tolerating well at this time.

## 2021-07-30 NOTE — Progress Notes (Signed)
LVAD Coordinator Rounding Note:  Admitted 07/18/21 due to Dr. [name] for elective VAD placement.   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 Cyndia Bent under Destination Therapy criteria due to BMI and chronic kidney disease.   Pt sitting up in recliner upon my arrival. Still having issues with severe gout pain. He is requiring frequent PRN pain medication. He has not been able to ambulate in the hallway due to pain. Assisted bedside RN in transferring patient back to bed for bedside RAMP echo- he was able to shuffle his feet to transfer to bed, but was very limited by pain. Encouraged to be as mobile as possible.   See separate note for RAMP echo documentation. Speed increased to 6100, low speed 5800.   Per Dr [name] Lasix and Milrinone drips have been stopped today. Order in to remove foley. Weight down 2 lbs today. Cr 1.77.   WBC down to 14 today. Remains afebrile.   Pt has not had bowel movement in 9 days. Currently taking Colace 200 mg daily, Miralax 17 g BID, Senna 8.6 mg BID, and Relistor injection last given 07/29/21. Will potentially need enema if no bowel movement today.   No family at bedside today. Left voicemail for patient's wife requesting call back to schedule dressing change and further VAD teaching for tomorrow.   Vital signs: Temp:  98.1 HR: 83 NSR Doppler: 102 Auto cuff:  104/85 (92) O2 Sat: 94% on 5L/[place] Wt:  277>292>285>296.3>305.5>305.5>302.9>299.8>297.6 lbs  LVAD interrogation reveals:  Speed: 5900 Flow:  4.5 Power:  4.5 w PI: 4.0 Hct: 31  Alarms:  none Events: 8 PI events so far today  Fixed speed:  5900 Low speed limit: 5600  Drive Line: Dressing change performed by patient's wife with VAD coordinator supervision and verbal cues. Existing VAD dressing removed and site care performed using sterile technique. Drive line exit site cleaned with Chlora prep applicators x 2, allowed to dry, and gauze dressing with silver strip applied. Exit site with one  suture intact, the velour is fully implanted at exit site; small amount dried bloody drainage on silver strip with slight redness, no tenderness, or foul odor noted. Small amount of serosanguinous drainage noted from site with cleansing.  Drive line anchor intact x 2. Will continue daily dressing changes per VAD Coordinator, Nurse Davonna Belling, or trained caregiver. Next dressing change due 07/31/21.   Labs:  LDH trend: 318>372>312>302>243>225>211  INR trend: 1.3>1.9>1.9>2.0>2.3>1.9>1.7  WBC trend: 12.8>11.6>13.1>17.2>17.7>14.0  Anticoagulation Plan: -INR Goal: 2.0 - 2.5  -ASA Dose: none due to allergy  Gtts:  Amiodarone 30mg /hr Lasix 10 mg/hr-- off Milrinone 0.125 mcg/kg/min--off  Blood Products:  - 07/21/21 2 units FFP (intra op)  Device: - [employer] single ICD -Therapies: off   Respiratory: Extubated 07/22/21  Nitric Oxide:  Off 07/22/21  Infection:  - sputum culture 07/23/21>>> specimen rejected - urine culture 07/23/21>>>no growth (final) - blood culture 07/23/21>>>no growth final  VAD Education: No family at bedside. Left VM message for patient's wife [name] to call back and schedule education and dressing change teaching for Thursday as her work schedule allows Pt verbalized and demonstrated self test Pt demonstrated good awareness of equipment location/security when mobilizing from chair to bed   Plan/Recommendations:  Call VAD Coordinator if any VAD equipment or drive line issues. Daily dressing changes per VAD Coordinator, Nurse Davonna Belling, or trained caregiver.   [name] RN [place] Coordinator  Office: [phone]  24/7 Pager: [phone]

## 2021-07-30 NOTE — Progress Notes (Signed)
  Echocardiogram 2D Echocardiogram has been performed.  [name] F 07/30/2021, 10:14 AM

## 2021-07-30 NOTE — Progress Notes (Signed)
Physical Therapy Treatment Patient Details Name: Jeffrey [name] MRN: [id number] DOB: 1975/12/06 Today's Date: 07/30/2021   History of Present Illness 45 y.o. male with class IV HF admitted 07/18/21 for pre-VAD optimization; has been relatively stable as outpatient with NYHA 3B symptoms. S/p RHC with swan placement 10/14. S/p HM3 LVAD implant on 10/17. Hypoxia overnight 10/19 requiring bipap. 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 Pt underwent cardioversion 10/23. PMH includes poorly controlled HTN, renal cell carcinoma s/p nephrectomy, CKD3, DM2, OSA, gout, morbid obesity, HF (EF<20%).   PT Comments    Pt progressing with mobility. Today's session focused on transfer training and standing activity for improving LE strength and activity tolerance; pt remains limited by significant bilateral knee pain (R>L), but motivated to mobilize within pain tolerance. Hopeful to progress gait training next session and trial rollator. Will continue to follow acutely to address established goals.    Recommendations for follow up therapy are one component of a multi-disciplinary discharge planning process, led by the attending physician.  Recommendations may be updated based on patient status, additional functional criteria and insurance authorization.  Follow Up Recommendations  Home health PT     Assistance Recommended at Discharge Intermittent Supervision/Assistance  Equipment Recommendations  Other (comment) (possibly bariatric rollator)    Recommendations for Other Services       Precautions / Restrictions Precautions Precautions: Fall;Sternal;Other (comment) Precaution Comments: LVAD     Mobility  Bed Mobility Overal bed mobility: Needs Assistance Bed Mobility: Supine to Sit     Supine to sit: Mod assist;HOB elevated     General bed mobility comments: ModA for RLE management and scooting R hip to EOB, pt able to elevate his trunk from elvated HOB without assist     Transfers Overall transfer level: Needs assistance Equipment used: Rolling walker (2 wheels) Transfers: Sit to/from Stand Sit to Stand: Min guard           General transfer comment: 2x sit<>stands from EOB to RW, reliant on momentum to power into standing while holding heart pillow, min guard for balance    Ambulation/Gait Ambulation/Gait assistance: Min guard Gait Distance (Feet): 4 Feet Assistive device: Rolling walker (2 wheels) Gait Pattern/deviations: Step-to pattern;Antalgic;Trunk flexed;Wide base of support Gait velocity: Decreased   General Gait Details: Slow, antalgic gait with RW and min guard for balance; able to take side steps at EOB, a few steps forwards/backwards then march in place; distance limited by pain   Stairs             Wheelchair Mobility    Modified Rankin (Stroke Patients Only)       Balance Overall balance assessment: Needs assistance Sitting-balance support: No upper extremity supported;Feet supported Sitting balance-Leahy Scale: Fair     Standing balance support: Bilateral upper extremity supported;During functional activity Standing balance-Leahy Scale: Fair Standing balance comment: can static stand without UE support; reliant on RW to offload painful BLEs with dynamic stability                            Cognition Arousal/Alertness: Awake/alert Behavior During Therapy: WFL for tasks assessed/performed Overall Cognitive Status: Within Functional Limits for tasks assessed                                 General Comments: Motivated despite gout pain        Exercises      General  Comments General comments (skin integrity, edema, etc.): return to bed as pt to receive enema      Pertinent Vitals/Pain Pain Assessment: Faces Faces Pain Scale: Hurts whole lot Pain Location: Bilateral knees (R>L), also elbows Pain Descriptors / Indicators: Guarding;Grimacing;Moaning Pain Intervention(s): Limited  activity within patient's tolerance;Monitored during session    Home Living                          Prior Function            PT Goals (current goals can now be found in the care plan section) Progress towards PT goals: Progressing toward goals    Frequency    Min 3X/week      PT Plan Current plan remains appropriate    Co-evaluation              AM-PAC PT "6 Clicks" Mobility   Outcome Measure  Help needed turning from your back to your side while in a flat bed without using bedrails?: A Lot Help needed moving from lying on your back to sitting on the side of a flat bed without using bedrails?: A Lot Help needed moving to and from a bed to a chair (including a wheelchair)?: A Little Help needed standing up from a chair using your arms (e.g., wheelchair or bedside chair)?: A Little Help needed to walk in hospital room?: A Little Help needed climbing 3-5 steps with a railing? : A Lot 6 Click Score: 15    End of Session Equipment Utilized During Treatment: Oxygen Activity Tolerance: Patient limited by pain Patient left: in chair;with call Getter/phone within reach;with nursing/sitter in room Nurse Communication: Mobility status PT Visit Diagnosis: Other abnormalities of gait and mobility (R26.89);Difficulty in walking, not elsewhere classified (R26.2)     Time: 2244-9753 PT Time Calculation (min) (ACUTE ONLY): 34 min  Charges:  $Therapeutic Exercise: 8-22 mins $Therapeutic Activity: 8-22 mins                     Mabeline Caras, PT, DPT Acute Rehabilitation Services  Pager [phone] Office [place] 07/30/2021, 5:08 PM

## 2021-07-30 NOTE — Progress Notes (Signed)
Able to flush lines with patient sitting up. Blood return noted. Lines are positional.

## 2021-07-30 NOTE — Progress Notes (Signed)
Speed  Flow  PI  Power  LVIDD  AI  Aortic opening MR  TR  Septum  RV  VTI (>18cm)  5900 4.5  4.7 4.6 6.92 trace 4/5  none  trace Bow right Mild hypo  12.9  6100  5.0 2.6 4.9 6.67 trace 3/5 none trace midline                                                             Doppler MAP: 128 Auto cuff BP: 104/85 (92)   Ramp ECHO performed at bedside per   At completion of ramp study, patients primary controller programmed:  Fixed speed: 6100 Low speed limit: [place] RN [place] Coordinator  Office: [phone]  24/7 Pager: [phone]

## 2021-07-31 DIAGNOSIS — I5023 Acute on chronic systolic (congestive) heart failure: Secondary | ICD-10-CM | POA: Diagnosis not present

## 2021-07-31 DIAGNOSIS — Z95811 Presence of heart assist device: Secondary | ICD-10-CM | POA: Diagnosis not present

## 2021-07-31 LAB — BASIC METABOLIC PANEL
Anion gap: 10 (ref 5–15)
BUN: 65 mg/dL — ABNORMAL HIGH (ref 6–20)
CO2: 29 mmol/L (ref 22–32)
Calcium: 10 mg/dL (ref 8.9–10.3)
Chloride: 94 mmol/L — ABNORMAL LOW (ref 98–111)
Creatinine, Ser: 1.89 mg/dL — ABNORMAL HIGH (ref 0.61–1.24)
GFR, Estimated: 44 mL/min — ABNORMAL LOW (ref >60)
Glucose, Bld: 264 mg/dL — ABNORMAL HIGH (ref 70–99)
Potassium: 3.5 mmol/L (ref 3.5–5.1)
Sodium: 133 mmol/L — ABNORMAL LOW (ref 135–145)

## 2021-07-31 LAB — COOXEMETRY PANEL
Carboxyhemoglobin: 1.2 % (ref 0.5–1.5)
Methemoglobin: 0.9 % (ref 0.0–1.5)
O2 Saturation: 66.5 %
Total hemoglobin: 11.3 g/dL — ABNORMAL LOW (ref 12.0–16.0)

## 2021-07-31 LAB — CBC
HCT: 30.7 % — ABNORMAL LOW (ref 39.0–52.0)
Hemoglobin: 10.2 g/dL — ABNORMAL LOW (ref 13.0–17.0)
MCH: 24.1 pg — ABNORMAL LOW (ref 26.0–34.0)
MCHC: 33.2 g/dL (ref 30.0–36.0)
MCV: 72.4 fL — ABNORMAL LOW (ref 80.0–100.0)
Platelets: 419 10*3/uL — ABNORMAL HIGH (ref 150–400)
RBC: 4.24 MIL/uL (ref 4.22–5.81)
RDW: 16.5 % — ABNORMAL HIGH (ref 11.5–15.5)
WBC: 12.1 10*3/uL — ABNORMAL HIGH (ref 4.0–10.5)
nRBC: 0.6 % — ABNORMAL HIGH (ref 0.0–0.2)

## 2021-07-31 LAB — GLUCOSE, CAPILLARY
Glucose-Capillary: 154 mg/dL — ABNORMAL HIGH (ref 70–99)
Glucose-Capillary: 137 mg/dL — ABNORMAL HIGH (ref 70–99)
Glucose-Capillary: 195 mg/dL — ABNORMAL HIGH (ref 70–99)
Glucose-Capillary: 255 mg/dL — ABNORMAL HIGH (ref 70–99)
Glucose-Capillary: 302 mg/dL — ABNORMAL HIGH (ref 70–99)

## 2021-07-31 LAB — LACTATE DEHYDROGENASE: LDH: 214 U/L — ABNORMAL HIGH (ref 98–192)

## 2021-07-31 LAB — PROTIME-INR
INR: 1.5 — ABNORMAL HIGH (ref 0.8–1.2)
Prothrombin Time: 18 seconds — ABNORMAL HIGH (ref 11.4–15.2)

## 2021-07-31 LAB — MAGNESIUM: Magnesium: 2 mg/dL (ref 1.7–2.4)

## 2021-07-31 MED ORDER — WARFARIN SODIUM 5 MG PO TABS
5.0000 mg | ORAL_TABLET | Freq: Once | ORAL | Status: AC
  Administered 2021-07-31: 5 mg via ORAL
  Filled 2021-07-31: qty 1

## 2021-07-31 MED ORDER — POTASSIUM CHLORIDE CRYS ER 20 MEQ PO TBCR
40.0000 meq | EXTENDED_RELEASE_TABLET | Freq: Once | ORAL | Status: AC
  Administered 2021-07-31: 40 meq via ORAL
  Filled 2021-07-31: qty 2

## 2021-07-31 MED ORDER — AMIODARONE HCL 200 MG PO TABS
200.0000 mg | ORAL_TABLET | Freq: Every day | ORAL | Status: DC
  Administered 2021-07-31 – 2021-08-08 (×9): 200 mg via ORAL
  Filled 2021-07-31 (×9): qty 1

## 2021-07-31 NOTE — Progress Notes (Signed)
Inpatient Rehabilitation Admissions Coordinator   Asked by Darrick Grinder [facility] and Dr [name] to assess patient for possible acute inpatient rehab needs. Noted gout limitations. Can PT and OT assess if patient will benefit from a CIR/acute inpatient rehab admit? I have alerted acute team and TOC of request. I will follow up.  Danne Baxter, RN, MSN Rehab Admissions Coordinator [phone] 07/31/2021 10:19 AM

## 2021-07-31 NOTE — Progress Notes (Signed)
ANTICOAGULATION CONSULT NOTE - Initial Consult  Pharmacy Consult for warfarin Indication:  LVAD  Allergies  Allergen Reactions   Aspirin Shortness Of Breath, Itching and Rash     Burning sensation (Patient reports he tolerates other NSAIDS)    Bee Venom Hives and Swelling   Lisinopril Cough   Shrimp (Diagnostic) Rash   Tomato Rash    Patient Measurements: Height: '5\' 7"'  (170.2 cm) Weight: 135 kg (297 lb 9.9 oz) IBW/kg (Calculated) : 66.1  Vital Signs: Temp: 97.8 F (36.6 C) (10/26 2300) Temp Source: Oral (10/26 2300) BP: 101/89 (10/27 0400) Pulse Rate: 91 (10/27 0400)  Labs: Recent Labs    07/29/21 0319 07/30/21 0304 07/30/21 0859 07/31/21 0339  HGB 10.4*  --  10.7* 10.2*  HCT 30.9*  --  31.7* 30.7*  PLT 432*  --  450* 419*  LABPROT 21.9* 19.6*  --  18.0*  INR 1.9* 1.7*  --  1.5*  CREATININE 1.66* 1.77*  --  1.89*     Estimated Creatinine Clearance: 64.7 mL/min (A) (by C-G formula based on SCr of 1.89 mg/dL (H)).   Medical History: Past Medical History:  Diagnosis Date   Anxiety    Aspirin allergy    Childhood asthma    Chronic systolic CHF (congestive heart failure) ([place])    a. EF 20-25% in 2012. b. EF 45-50% in 10/2011 with nonischemic nuc - presumed NICM. c. 12/2014 Echo: Sev depressed LV fxn, sev dil LV, mild LVH, mild MR, sev dil LA, mildly reduced RV fxn.   CKD (chronic kidney disease) stage 2, GFR 60-89 ml/min    H/O vasectomy 12/2019   High cholesterol    Hypertension    Morbid obesity ([place])    Nephrolithiasis    OSA on CPAP    Paroxysmal atrial fibrillation ([place])    Presumed NICM    a. 04/2014 Myoview: EF 26%, glob HK, sev glob HK, ? prior infarct;  b. Never cathed 2/2 CKD.   Renal cell carcinoma ([name])    a. s/p Rt robotic assisted partial converted to radical nephrectomy on 01/2013.   Troponin level elevated    a. 04/2014, 12/2014: felt due to CHF.   Type II diabetes mellitus ([facility])    Ventricular tachycardia    a. appropriate ICD therapy 12/2017     Medications:  Medications Prior to Admission  Medication Sig Dispense Refill Last Dose   acetaminophen (TYLENOL) 325 MG tablet Take 2 tablets (650 mg total) by mouth every 6 (six) hours as needed for fever.   Past Week   allopurinol (ZYLOPRIM) 100 MG tablet Take 1 tablet (100 mg total) by mouth daily. 90 tablet 3 07/18/2021   ALPRAZolam (XANAX) 0.5 MG tablet Take 1 tablet (0.5 mg total) by mouth 2 (two) times daily. 60 tablet 4 Past Week   amiodarone (PACERONE) 200 MG tablet Take 0.5 tablets (100 mg total) by mouth daily. 30 tablet 3 07/17/2021   apixaban (ELIQUIS) 5 MG TABS tablet Take 1 tablet (5 mg total) by mouth 2 (two) times daily. 60 tablet 3 07/14/2021   diclofenac Sodium (VOLTAREN) 1 % GEL Apply 2 g topically 4 (four) times daily. 100 g 0 Past Week   docusate sodium (COLACE) 100 MG capsule Take 1 capsule (100 mg total) by mouth 2 (two) times daily. 10 capsule 0 Past Week   empagliflozin (JARDIANCE) 10 MG TABS tablet TAKE 1 TABLET (10 MG TOTAL) BY MOUTH DAILY BEFORE BREAKFAST. 90 tablet 3 07/18/2021   insulin aspart protamine -  aspart (NOVOLOG 70/30 MIX) (70-30) 100 UNIT/ML FlexPen Inject 38 Units into the skin 2 (two) times daily. 15 mL 11 07/17/2021   Insulin Syringe-Needle U-100 (TRUEPLUS INSULIN SYRINGE) 31G X 5/16" 0.5 ML MISC USE TO INJECT INSULIN TWICE DAILY. 100 each 11 07/17/2021   losartan (COZAAR) 25 MG tablet Take 0.5 tablets (12.5 mg total) by mouth daily. 45 tablet 3 07/18/2021   metFORMIN (GLUCOPHAGE) 1000 MG tablet Take 1 tablet (1,000 mg total) by mouth 2 (two) times daily with a meal. 60 tablet 11 07/18/2021   pantoprazole (PROTONIX) 40 MG tablet Take 1 tablet (40 mg total) by mouth daily. 30 tablet 0 Past Week   polyethylene glycol (MIRALAX / GLYCOLAX) 17 g packet Take 17 g by mouth daily. 14 each 0 Past Week   sertraline (ZOLOFT) 25 MG tablet Take 1 tablet (25 mg total) by mouth daily. 30 tablet 3 07/18/2021   spironolactone (ALDACTONE) 25 MG tablet Take 1 tablet  (25 mg total) by mouth daily. 30 tablet 6 07/18/2021   torsemide (DEMADEX) 20 MG tablet Take 1 tablet (20 mg total) by mouth daily as needed (If wt gain > 3 lb in 24 hr or > 5 lb in 1 week). 30 tablet 2 Past Week   traMADol (ULTRAM) 50 MG tablet Take 2 tablets (100 mg total) by mouth every 12 (twelve) hours as needed for moderate pain. 30 tablet 0 Past Week   traZODone (DESYREL) 100 MG tablet Take 1 tablet (100 mg total) by mouth at bedtime. 30 tablet 0 07/17/2021   nitroGLYCERIN (NITROSTAT) 0.4 MG SL tablet Place 1 tablet (0.4 mg total) under the tongue every 5 (five) minutes x 3 doses as needed for chest pain. 25 tablet 12 Unknown   Scheduled:   allopurinol  100 mg Oral Daily   amiodarone  150 mg Intravenous Once   chlorhexidine gluconate (MEDLINE KIT)  15 mL Mouth Rinse BID   Chlorhexidine Gluconate Cloth  6 each Topical Daily   colchicine  0.6 mg Oral Daily   docusate sodium  200 mg Oral Daily   gabapentin  300 mg Oral Daily   insulin aspart  0-20 Units Subcutaneous TID WC   insulin aspart  0-5 Units Subcutaneous QHS   insulin detemir  30 Units Subcutaneous BID   isosorbide-hydrALAZINE  2 tablet Oral TID   mouth rinse  15 mL Mouth Rinse BID   pantoprazole  40 mg Oral Daily   polyethylene glycol  17 g Oral BID   predniSONE  40 mg Oral Q breakfast   senna  1 tablet Oral BID   sertraline  25 mg Oral Daily   sodium chloride flush  10-40 mL Intracatheter Q12H   sodium chloride flush  3 mL Intravenous Q12H   spironolactone  25 mg Oral Daily   traZODone  100 mg Per Tube QHS   Warfarin - Pharmacist Dosing Inpatient   Does not apply q1600   Infusions:   sodium chloride Stopped (07/29/21 1211)   amiodarone 30 mg/hr (07/31/21 0300)    Assessment: Patient is status post LVAD placement on 10/17. Warfarin was initiated on 10/18. Most recent INR this morning decreased to 1.5 after increasing dose to 2.5 mg on 10/26. Effect of dose increase likely not yet reflected in INR. Patient is also  taking allopurinol, amiodarone, and prednisone. No bleeding reported. CBC stable. Diet increased. Will increase dose today and follow primary team's decision on whether or not to bridge.  Goal of Therapy:  INR 2-2.5 Monitor  platelets by anticoagulation protocol: Yes   Plan:  Give warfarin 5 mg today Daily INR  Thank you for allowing pharmacy to participate in this patient's care.  Reatha Harps, PharmD PGY1 Pharmacy Resident 07/31/2021 4:52 AM Check [email or web address] for unit specific pharmacy number

## 2021-07-31 NOTE — Progress Notes (Addendum)
Advanced Heart Failure VAD Team Note  PCP-Cardiologist: Glori Bickers, MD   Subjective:    10/17: HM3 LVAD implant (DT)  10/18: Extubated 10/23: DC-CV for A fib RVR--> NSR. Milrinone cut back to 0.125 mcg.   10/25: Bidil increased to 2 tabs tid.  10/26: Lasix and milrinone stopped. Had ramp echo with speed increased to 6100  CO-OX 66.5%.    Creatinine trending up 1.5>1.6>1.8 >1.9   2 BMs 10/26 and 1 10/27  Complaining of knee pain. Denies SOB. Having a hard time standing.    LVAD INTERROGATION:  HeartMate III LVAD:   Flow 5 liters/min, speed 6100, power 4.5  PI 2.7   PI events 12 Objective:    Vital Signs:   Temp:  [97.7 F (36.5 C)-98.6 F (37 C)] 98.2 F (36.8 C) (10/27 0645) Pulse Rate:  [82-191] 170 (10/27 0901) Resp:  [14-34] 27 (10/27 0901) BP: (95-117)/(57-102) 117/87 (10/27 0901) SpO2:  [89 %-98 %] 95 % (10/27 0901) Weight:  [133.4 kg] 133.4 kg (10/27 0500) Last BM Date: 07/30/21 Mean arterial Pressure  100s    Intake/Output:   Intake/Output Summary (Last 24 hours) at 07/31/2021 0939 Last data filed at 07/31/2021 0900 Gross per 24 hour  Intake 410.77 ml  Output 2530 ml  Net -2119.23 ml     Physical Exam  Physical Exam: GENERAL: No acute distress. In the chair.  HEENT: normal  NECK: Supple, JVP  5-6 .  2+ bilaterally, no bruits.  No lymphadenopathy or thyromegaly appreciated.   CARDIAC:  Mechanical heart sounds with LVAD hum present.  LUNGS:  Clear to auscultation bilaterally.  ABDOMEN:  obese, soft, round, nontender, positive bowel sounds x4.     LVAD exit site:  Dressing dry and intact.  No erythema or drainage.  Stabilization device present and accurately applied.  Driveline dressing is being changed daily per sterile technique. EXTREMITIES:  Warm and dry, no cyanosis, clubbing, rash or edema . RUE PICC NEUROLOGIC:  Alert and oriented x 3.    No aphasia.  No dysarthria.  Affect pleasant.       Telemetry  SR 80s personally reviewed.    Labs   Basic Metabolic Panel: Recent Labs  Lab 07/25/21 0323 07/25/21 0436 07/26/21 0344 07/26/21 1707 07/27/21 0359 07/27/21 1712 07/28/21 0351 07/29/21 0319 07/30/21 0304 07/31/21 0339  NA 135   < > 135   < > 135 131* 132* 133* 133* 133*  K 3.9   < > 3.2*   < > 3.5 4.1 3.7 4.2 4.0 3.5  CL 102   < > 99   < > 98 96* 94* 95* 94* 94*  CO2 26   < > 27   < > _0 GLUCOSE 131*   < > 137*   < > 131* 291* 169* 163* 195* 264*  BUN 31*   < > 31*   < > 34* 38* 41* 53* 64* 65*  CREATININE 1.47*   < > 1.39*   < > 1.47* 1.68* 1.59* 1.66* 1.77* 1.89*  CALCIUM 10.2   < > 10.3   < > 10.3 10.1 10.5* 10.8* 10.8* 10.0  MG 2.2  --  1.9  --  2.0  --  2.0  --   --  2.0  PHOS 3.8  --  3.4  --  3.6  --  4.4  --   --   --    < > = values in this interval not displayed.  Liver Function Tests: No results for input(s): AST, ALT, ALKPHOS, BILITOT, PROT, ALBUMIN in the last 168 hours.  No results for input(s): LIPASE, AMYLASE in the last 168 hours. No results for input(s): AMMONIA in the last 168 hours.  CBC: Recent Labs  Lab 07/25/21 0323 07/25/21 0436 07/26/21 0344 07/27/21 0359 07/28/21 0351 07/29/21 0319 07/30/21 0859 07/31/21 0339  WBC 12.8*  --  11.6* 13.1* 17.1* 17.7* 14.0* 12.1*  NEUTROABS 9.4*  --  7.5 8.5* 12.6*  --   --   --   HGB 10.1*   < > 10.0* 9.9* 10.7* 10.4* 10.7* 10.2*  HCT 32.4*   < > 32.0* 31.3* 32.0* 30.9* 31.7* 30.7*  MCV 77.7*  --  76.2* 75.4* 72.7* 71.5* 70.6* 72.4*  PLT 235  --  309 391 403* 432* 450* 419*   < > = values in this interval not displayed.    INR: Recent Labs  Lab 07/27/21 0359 07/28/21 0351 07/29/21 0319 07/30/21 0304 07/31/21 0339  INR 2.9* 2.3* 1.9* 1.7* 1.5*    Other results:   Imaging   ECHOCARDIOGRAM LIMITED  Result Date: 07/30/2021    ECHOCARDIOGRAM LIMITED REPORT   Patient Name:   Jeffrey [name] Date of Exam: 07/30/2021 Medical Rec #:  [id number]    Height:       67.0 in Accession #:    [id number]   Weight:        297.6 lb Date of Birth:  [birth year]    BSA:          2.394 m Patient Age:    45 years     BP:           126/102 mmHg Patient Gender: M            HR:           83 bpm. Exam Location:  Inpatient Procedure: 2D Echo, Cardiac Doppler and Color Doppler Indications:    RAMP  History:        Patient has prior history of Echocardiogram examinations, most                 recent 04/30/2021. CHF, Arrythmias:Atrial Fibrillation; Risk                 Factors:Hypertension, Diabetes and Dyslipidemia. CKD. LVAD.  Sonographer:    [name] RDCS Referring Phys: [place]  1. Left ventricular ejection fraction, by estimation, is <20%. The left ventricle has severely decreased function. The left ventricle demonstrates global hypokinesis. The left ventricular internal cavity size was moderately dilated. The septum is shifted towards the RV.  2. Right ventricular systolic function is moderately reduced. The right ventricular size is mildly enlarged.  3. The aortic valve opens 2/3 beats. The aortic valve is tricuspid.  4. LVAD ramp echo, the LVAD inflow cannula is not visualized. FINDINGS  Left Ventricle: Left ventricular ejection fraction, by estimation, is <20%. The left ventricle has severely decreased function. The left ventricle demonstrates global hypokinesis. The left ventricular internal cavity size was moderately dilated. Right Ventricle: The right ventricular size is mildly enlarged. Right ventricular systolic function is moderately reduced. Tricuspid Valve: The tricuspid valve is normal in structure. Tricuspid valve regurgitation is trivial. Aortic Valve: The aortic valve opens 2/3 beats. The aortic valve is tricuspid. Pulmonic Valve: The pulmonic valve was normal in structure. Pulmonic valve regurgitation is trivial. RIGHT VENTRICLE RV S prime:     9.36 cm/s PULMONIC VALVE PV Vmax:  0.97 m/s PV Vmean:      61.000 cm/s PV VTI:        0.129 m PV Peak grad:  3.7 mmHg PV Mean grad:  2.0 mmHg   Dalton McleanMD Electronically signed by Franki Monte Signature Date/Time: 07/30/2021/3:08:34 PM    Final      Medications:     Scheduled Medications:  allopurinol  100 mg Oral Daily   amiodarone  150 mg Intravenous Once   chlorhexidine gluconate (MEDLINE KIT)  15 mL Mouth Rinse BID   Chlorhexidine Gluconate Cloth  6 each Topical Daily   colchicine  0.6 mg Oral Daily   docusate sodium  200 mg Oral Daily   gabapentin  300 mg Oral Daily   insulin aspart  0-20 Units Subcutaneous TID WC   insulin aspart  0-5 Units Subcutaneous QHS   insulin detemir  30 Units Subcutaneous BID   isosorbide-hydrALAZINE  2 tablet Oral TID   mouth rinse  15 mL Mouth Rinse BID   pantoprazole  40 mg Oral Daily   polyethylene glycol  17 g Oral BID   predniSONE  40 mg Oral Q breakfast   senna  1 tablet Oral BID   sertraline  25 mg Oral Daily   sodium chloride flush  10-40 mL Intracatheter Q12H   sodium chloride flush  3 mL Intravenous Q12H   spironolactone  25 mg Oral Daily   traZODone  100 mg Per Tube QHS   warfarin  5 mg Oral ONCE-1600   Warfarin - Pharmacist Dosing Inpatient   Does not apply q1600    Infusions:  sodium chloride Stopped (07/29/21 1211)   amiodarone 30 mg/hr (07/31/21 0900)    PRN Medications: sodium chloride, acetaminophen, ondansetron (ZOFRAN) IV, oxyCODONE, sodium chloride flush, sodium chloride flush, traMADol   Assessment/Plan:    1. Acute on Chronic systolic CHF - Nonischemic CM, end-stage - Echo (9/21): EF 20-25%.   - Echo  8/22: EF < 20%, severe LV dilation, moderate RV dysfunction.   - RHC (8/22): well compensated hemodynamics. - s/p S-ICD -Not transplant candidate with BMI - S/p HM3 LVAD 10/17 (DT) - CO-OX  66.5% off milrinone. .  -Unable to check CVP from PICC.  - Hold diuretics for now -On bidil 2 tabs tid.  - No ARB with elevated creatinine.   2. VAD  - s/p HM-3 placement 10/17 - LDH 211 - Maps 100s . Continue bidil to 2 bid --Continue IV lasix but  will cut back today at 10 mg per hour.  - VAD interrogated personally. Parameters stable. - 10/26 Ramp echo twith speed increased to 6100.  - INR 1.5. Discussed with pharmacy.  Hold off on heparin drip for now.  - Continue neurontin for pocket pain  3 Atrial fibrillation: Paroxysmal - Had severe AF with RVR 7/22 s/p DC-CV x 2 - Seen by EP. Not a candidate for ablation due to body habitus  (could consider AVN ablation and CRT, if needed)  - Back in AF with RVR post VAD. Failed RAP x 3.  - 10/23 S/P DC-CV with restoration of NSR. - Stop amio drip. Place on amio 200 mg daily.    - warfarin per CT surgery . INR 1.5   4. CKD stage 3B  - Has solitary kidney.   - Baseline creatinine is 1.6-2.9.  - SCr 1.39 -> 1.47>1.6 > 1.66 >1.77> 1.9  - BMET daily    5. OSA - Continue CPAP   6. DM2. Poorly controlled - On  Jardiance. - Hgb A1c 9.4. - Cover with SSI   7. Hematuria - MRI and cystoscopy ok   8. Polyarticular gout. - severe - Continue prednisone daily for now.    - Continue colchicine daily    Transfer to 2 C.   I reviewed theLVAD parameters from today, and compared the results to the patient's prior recorded data.  No programming changes were made.  The LVAD is functioning within specified parameters.  The patient performs LVAD self-test daily.  LVAD interrogation was negative for any significant power changes, alarms or PI events/speed drops.  LVAD equipment check completed and is in good working order.  Back-up equipment present.   LVAD education done on emergency procedures and precautions and reviewed exit site care.  Length of Stay: [place], NP 07/31/2021, 9:39 AM  VAD Team --- VAD ISSUES ONLY--- Pager [phone] (7am - 7am)  Advanced Heart Failure Team  Pager [phone] (M-F; 7a - 5p)   Patient seen and examined with the above-signed Advanced Practice Provider and/or Housestaff. I personally reviewed laboratory data, imaging studies and relevant notes. I  independently examined the patient and formulated the important aspects of the plan. I have edited the note to reflect any of my changes or salient points. I have personally discussed the plan with the patient and/or family.  Milrinone and IV lasix stopped yesterday.  Ramp echo  yesterday and speed increased to 6100. Had BM x 3 with enema yesterday. Remains in NSR. Gout improving.   General:  NAD.  HEENT: normal  Neck: supple. JVP not elevated.  Carotids 2+ bilat; no bruits. No lymphadenopathy or thryomegaly appreciated. Cor: LVAD hum.  Lungs: Clear. Abdomen: obese soft, nontender, non-distended. No hepatosplenomegaly. No bruits or masses. Good bowel sounds. Driveline site clean. Anchor in place.  Extremities: no cyanosis, clubbing, rash. Warm no edema  Neuro: alert & oriented x 3. No focal deficits. Moves all 4 without problem   Co-ox stable of milrinone. Volume status improved. Maintaining NSR. SCr up a bit. INR low. VAD interrogated personally. Parameters stable.  - increase speed to 6200 - hold diuretics - switch amio to po - increase warfarin (Discussed dosing with PharmD personally)  - can go to [facility] - consult CIR  Glori Bickers, MD  10:04 AM

## 2021-07-31 NOTE — Plan of Care (Signed)
  Problem: Education: Goal: Knowledge of General Education information will improve Description: Including pain rating scale, medication(s)/side effects and non-pharmacologic comfort measures Outcome: Progressing   Problem: Health Behavior/Discharge Planning: Goal: Ability to manage health-related needs will improve Outcome: Progressing   Problem: Clinical Measurements: Goal: Ability to maintain clinical measurements within normal limits will improve Outcome: Progressing Goal: Will remain free from infection Outcome: Progressing Goal: Diagnostic test results will improve Outcome: Progressing Goal: Respiratory complications will improve Outcome: Progressing Goal: Cardiovascular complication will be avoided Outcome: Progressing   Problem: Activity: Goal: Risk for activity intolerance will decrease Outcome: Progressing   Problem: Nutrition: Goal: Adequate nutrition will be maintained Outcome: Progressing   Problem: Coping: Goal: Level of anxiety will decrease Outcome: Progressing   Problem: Elimination: Goal: Will not experience complications related to bowel motility Outcome: Progressing Goal: Will not experience complications related to urinary retention Outcome: Progressing   Problem: Pain Managment: Goal: General experience of comfort will improve Outcome: Progressing   Problem: Safety: Goal: Ability to remain free from injury will improve Outcome: Progressing   Problem: Skin Integrity: Goal: Risk for impaired skin integrity will decrease Outcome: Progressing   Problem: Education: Goal: Knowledge of the prescribed therapeutic regimen will improve Outcome: Progressing   Problem: Cardiac: Goal: Ability to maintain an adequate cardiac output will improve Outcome: Progressing

## 2021-07-31 NOTE — Progress Notes (Signed)
CSW visited patient at bedside. Patient reports he is feeling well from surgery although still struggling with his gout. Patient looking forward to going home and starting his new life living with his LVAD. CSW provided supportive intervention and will continue to follow throughout implant hospitalization. Raquel Sarna, [place], [place]

## 2021-07-31 NOTE — Progress Notes (Signed)
LVAD Coordinator Rounding Note:  Admitted 07/18/21 due to Dr. [name] for elective VAD placement.   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 Cyndia Bent under Destination Therapy criteria due to BMI and chronic kidney disease.   Pt sitting up in recliner. Still having issues with severe gout pain, especially in his right knee. He is requiring frequent PRN pain medication. He has not been able to ambulate in the hallway due to pain. Encouraged increased mobilization. Pt has had 2 bowel movements since yesterday. States his belly "feels much better."   Tolerating speed increase to 6100. Minimal PI events noted on interrogation. Increased speed to 6200 per Dr [name].   Has tolerated being off Lasix and Milrinone drips. Weight down 3 lbs today. Cr 1.89.   WBC down to 12.1 today. Remains afebrile.   VAD teaching as documented below. Pt's wife [name] will reach out to VAD coordinators tomorrow morning to schedule further teaching time. Discharge education notebook provided to patient and his wife.   May transfer to [facility] per Dr [name].   Vital signs: Temp:  98.1 HR: 88 NSR Doppler: 102 Auto cuff:  102/77 (87) O2 Sat: 97% on 2L/[place] Wt:  277>292>285>296.3>305.5>305.5>302.9>299.8>297.6>294.1 lbs  LVAD interrogation reveals:  Speed: 6200 Flow: 5.0 Power:  4.9 w PI: 2.5 Hct: 31  Alarms:  none Events: 10 PI events yesterday  Fixed speed:  5900 Low speed limit: 5600  Drive Line: Dressing change performed by patient's wife with VAD coordinator supervision and verbal cues. Existing VAD dressing removed and site care performed using sterile technique. Drive line exit site cleaned with Chlora prep applicators x 2, allowed to dry, and gauze dressing with silver strip applied. Exit site with one suture intact, the velour is fully implanted at exit site; small amount dried bloody drainage on silver strip with slight redness, no tenderness, or foul odor noted. Small amount of serosanguinous drainage  noted from site with cleansing.  Drive line anchor intact x 2. Will continue daily dressing changes per VAD Coordinator, Nurse Davonna Belling, or trained caregiver. Next dressing change due 08/01/21.     Labs:  LDH trend: 318>372>312>302>243>225>211>214  INR trend: 1.3>1.9>1.9>2.0>2.3>1.9>1.7>1.5  WBC trend: 12.8>11.6>13.1>17.2>17.7>14.0>12.1  Anticoagulation Plan: -INR Goal: 2.0 - 2.5  -ASA Dose: none due to allergy  Gtts:  Amiodarone 30mg /hr Lasix 10 mg/hr-- off Milrinone 0.125 mcg/kg/min--off  Blood Products:  - 07/21/21 2 units FFP (intra op)  Device: - [employer] single ICD -Therapies: off   Respiratory: Extubated 07/22/21  Nitric Oxide:  Off 07/22/21  Infection:  - sputum culture 07/23/21>>> specimen rejected - urine culture 07/23/21>>>no growth (final) - blood culture 07/23/21>>>no growth final  VAD Education: Patient's wife [name] performed drive line dressing change with minimal verbal cues from VAD coordinator. Will plan to observe her perform dressing change once more prior to checking off. Reviewed controller functions, buttons, how to review previous alarms, and pump running symbol Briefly discussed yellow wrench and red heart alarms Discussed and demonstrated self test with [facility] Discussed modular cable connection. The two cables are connected by aligning the triangles, applying firm force to engage the cables and rotating the locking nut until the clicking sound stops and the yellow line is no longer visible. Keep the connection dry. Briefly discussed batteries, and importance of charging/rotating batteries Discussed importance of black bag (and components in bag- extra clips, batteries, and backup controller Briefly reviewed controller back up battery function Pt user manual provided to [facility]. Requested she begin reviewing information Pt discharge binder delivered to  bedside   Plan/Recommendations:  Call VAD Coordinator if any VAD equipment or  drive line issues. Daily dressing changes per VAD Coordinator, Nurse Davonna Belling, or trained caregiver.   [name] RN [place] Coordinator  Office: [phone]  24/7 Pager: [phone]

## 2021-07-31 NOTE — Progress Notes (Signed)
Occupational Therapy Treatment Patient Details Name: Jeffrey [name] MRN: [id number] DOB: [birth year] Today's Date: 07/31/2021   History of present illness 45 y.o. male with class IV HF admitted 07/18/21 for pre-VAD optimization; has been relatively stable as outpatient with NYHA 3B symptoms. S/p RHC with swan placement 10/14. S/p HM3 LVAD implant on 10/17. Hypoxia overnight 10/19 requiring bipap. 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 Pt underwent cardioversion 10/23. PMH includes poorly controlled HTN, renal cell carcinoma s/p nephrectomy, CKD3, DM2, OSA, gout, morbid obesity, HF (EF<20%).   OT comments  Pt works hard, but continues to be limited by gout pain in R knee particularly. Pt requiring set up for grooming, mod assist for UB ADL and total assist for LB ADL. He stands with min assist and transfers with min guard assist with RW hold heart pillow. Pt likely to progress well once pain decreases.    Recommendations for follow up therapy are one component of a multi-disciplinary discharge planning process, led by the attending physician.  Recommendations may be updated based on patient status, additional functional criteria and insurance authorization.    Follow Up Recommendations  No OT follow up    Assistance Recommended at Discharge Intermittent Supervision/Assistance  Equipment Recommendations  BSC (bariatric)    Recommendations for Other Services      Precautions / Restrictions Precautions Precautions: Fall;Sternal;Other (comment) Precaution Comments: LVAD       Mobility Bed Mobility Overal bed mobility: Needs Assistance Bed Mobility: Sit to Supine       Sit to supine: Mod assist   General bed mobility comments: assist for LEs into bed    Transfers Overall transfer level: Needs assistance Equipment used: Rolling walker (2 wheels);None Transfers: Sit to/from [employer] to Stand: Min assist Stand pivot transfers: Min guard;+2  safety/equipment         General transfer comment: chair to [facility] to bed     Balance Overall balance assessment: Needs assistance Sitting-balance support: No upper extremity supported;Feet supported Sitting balance-Leahy Scale: Fair       Standing balance-Leahy Scale: Fair Standing balance comment: statically, but benefits from RW due to pain                           ADL either performed or assessed with clinical judgement   ADL Overall ADL's : Needs assistance/impaired     Grooming: Wash/dry hands;Wash/dry face;Sitting;Set up   Upper Body Bathing: Moderate assistance;Sitting Upper Body Bathing Details (indicate cue type and reason): assist for back Lower Body Bathing: Total assistance;Sit to/from stand;+2 for safety/equipment   Upper Body Dressing : Moderate assistance;Sitting Upper Body Dressing Details (indicate cue type and reason): due to lines Lower Body Dressing: Total assistance;Bed level Lower Body Dressing Details (indicate cue type and reason): socks Toilet Transfer: Min guard;Stand-pivot;BSC   Toileting- Clothing Manipulation and Hygiene: Total assistance;Sit to/from stand               Vision       Perception     Praxis      Cognition Arousal/Alertness: Awake/alert Behavior During Therapy: WFL for tasks assessed/performed Overall Cognitive Status: Within Functional Limits for tasks assessed                                            Exercises     Shoulder Instructions  General Comments      Pertinent Vitals/ Pain       Pain Assessment: 0-10 Pain Score: 8  Pain Location: R knee Pain Descriptors / Indicators: Guarding;Grimacing;Moaning Pain Intervention(s): Monitored during session;Repositioned;RN gave pain meds during session  Home Living                                          Prior Functioning/Environment              Frequency  Min 2X/week        Progress Toward  Goals  OT Goals(current goals can now be found in the care plan section)  Progress towards OT goals: Progressing toward goals  Acute Rehab OT Goals OT Goal Formulation: With patient Time For Goal Achievement: 08/06/21 Potential to Achieve Goals: Good  Plan Discharge plan remains appropriate    Co-evaluation                 AM-PAC OT "6 Clicks" Daily Activity     Outcome Measure   Help from another person eating meals?: None Help from another person taking care of personal grooming?: A Little Help from another person toileting, which includes using toliet, bedpan, or urinal?: Total Help from another person bathing (including washing, rinsing, drying)?: A Lot Help from another person to put on and taking off regular upper body clothing?: A Lot Help from another person to put on and taking off regular lower body clothing?: Total 6 Click Score: 13    End of Session Equipment Utilized During Treatment: Rolling walker (2 wheels);Oxygen  OT Visit Diagnosis: Unsteadiness on feet (R26.81);Other abnormalities of gait and mobility (R26.89);Pain;Muscle weakness (generalized) (M62.81) Pain - Right/Left: Right Pain - part of body: Knee   Activity Tolerance Patient limited by pain   Patient Left in bed;with call Yamamoto/phone within reach;with nursing/sitter in room   Nurse Communication Mobility status        Time: 1103-1594 OT Time Calculation (min): 47 min  Charges: OT General Charges $OT Visit: 1 Visit OT Treatments $Self Care/Home Management : 38-52 mins  Nestor Lewandowsky, OTR/L Acute Rehabilitation Services Pager: [phone] Office: [phone]  Malka So 07/31/2021, 12:18 PM

## 2021-07-31 NOTE — Progress Notes (Signed)
Visit made to patients room to assist with setting up his home CPAP.  Sterile water added for humidity per pt this is home regimen.  3lpm oxygen bled in.  Tolerating well at this time.

## 2021-08-01 ENCOUNTER — Ambulatory Visit: Payer: Self-pay

## 2021-08-01 ENCOUNTER — Telehealth: Payer: Self-pay | Admitting: Pharmacist

## 2021-08-01 DIAGNOSIS — Z95811 Presence of heart assist device: Secondary | ICD-10-CM | POA: Diagnosis not present

## 2021-08-01 DIAGNOSIS — M1A9XX0 Chronic gout, unspecified, without tophus (tophi): Secondary | ICD-10-CM | POA: Diagnosis not present

## 2021-08-01 DIAGNOSIS — I5023 Acute on chronic systolic (congestive) heart failure: Secondary | ICD-10-CM | POA: Diagnosis not present

## 2021-08-01 DIAGNOSIS — M25461 Effusion, right knee: Secondary | ICD-10-CM | POA: Diagnosis not present

## 2021-08-01 LAB — SYNOVIAL CELL COUNT + DIFF, W/ CRYSTALS
Eosinophils-Synovial: 1 % (ref 0–1)
Lymphocytes-Synovial Fld: 9 % (ref 0–20)
Monocyte-Macrophage-Synovial Fluid: 5 % — ABNORMAL LOW (ref 50–90)
Neutrophil, Synovial: 85 % — ABNORMAL HIGH (ref 0–25)
WBC, Synovial: 1755 /mm3 — ABNORMAL HIGH (ref 0–200)

## 2021-08-01 LAB — BASIC METABOLIC PANEL
Anion gap: 6 (ref 5–15)
BUN: 54 mg/dL — ABNORMAL HIGH (ref 6–20)
CO2: 30 mmol/L (ref 22–32)
Calcium: 10.4 mg/dL — ABNORMAL HIGH (ref 8.9–10.3)
Chloride: 99 mmol/L (ref 98–111)
Creatinine, Ser: 1.57 mg/dL — ABNORMAL HIGH (ref 0.61–1.24)
GFR, Estimated: 55 mL/min — ABNORMAL LOW (ref >60)
Glucose, Bld: 144 mg/dL — ABNORMAL HIGH (ref 70–99)
Potassium: 3.9 mmol/L (ref 3.5–5.1)
Sodium: 135 mmol/L (ref 135–145)

## 2021-08-01 LAB — CBC
HCT: 29.1 % — ABNORMAL LOW (ref 39.0–52.0)
Hemoglobin: 9.3 g/dL — ABNORMAL LOW (ref 13.0–17.0)
MCH: 23.5 pg — ABNORMAL LOW (ref 26.0–34.0)
MCHC: 32 g/dL (ref 30.0–36.0)
MCV: 73.5 fL — ABNORMAL LOW (ref 80.0–100.0)
Platelets: 468 10*3/uL — ABNORMAL HIGH (ref 150–400)
RBC: 3.96 MIL/uL — ABNORMAL LOW (ref 4.22–5.81)
RDW: 16.6 % — ABNORMAL HIGH (ref 11.5–15.5)
WBC: 15.9 10*3/uL — ABNORMAL HIGH (ref 4.0–10.5)
nRBC: 0.2 % (ref 0.0–0.2)

## 2021-08-01 LAB — GLUCOSE, CAPILLARY
Glucose-Capillary: 149 mg/dL — ABNORMAL HIGH (ref 70–99)
Glucose-Capillary: 259 mg/dL — ABNORMAL HIGH (ref 70–99)
Glucose-Capillary: 191 mg/dL — ABNORMAL HIGH (ref 70–99)
Glucose-Capillary: 264 mg/dL — ABNORMAL HIGH (ref 70–99)

## 2021-08-01 LAB — PROTIME-INR
INR: 1.3 — ABNORMAL HIGH (ref 0.8–1.2)
Prothrombin Time: 16.5 seconds — ABNORMAL HIGH (ref 11.4–15.2)

## 2021-08-01 LAB — COOXEMETRY PANEL
Carboxyhemoglobin: 1.5 % (ref 0.5–1.5)
Methemoglobin: 0.9 % (ref 0.0–1.5)
O2 Saturation: 67.7 %
Total hemoglobin: 9.9 g/dL — ABNORMAL LOW (ref 12.0–16.0)

## 2021-08-01 LAB — MAGNESIUM: Magnesium: 2.2 mg/dL (ref 1.7–2.4)

## 2021-08-01 LAB — HEPARIN LEVEL (UNFRACTIONATED): Heparin Unfractionated: <0.1 IU/mL — ABNORMAL LOW (ref 0.30–0.70)

## 2021-08-01 LAB — LACTATE DEHYDROGENASE: LDH: 186 U/L (ref 98–192)

## 2021-08-01 LAB — URIC ACID: Uric Acid, Serum: 11.5 mg/dL — ABNORMAL HIGH (ref 3.7–8.6)

## 2021-08-01 MED ORDER — TRAZODONE HCL 50 MG PO TABS
100.0000 mg | ORAL_TABLET | Freq: Every day | ORAL | Status: DC
  Administered 2021-08-01 – 2021-08-07 (×7): 100 mg via ORAL
  Filled 2021-08-01 (×7): qty 2

## 2021-08-01 MED ORDER — WARFARIN SODIUM 3 MG PO TABS
6.0000 mg | ORAL_TABLET | Freq: Once | ORAL | Status: AC
  Administered 2021-08-01: 6 mg via ORAL
  Filled 2021-08-01: qty 2

## 2021-08-01 MED ORDER — BUPIVACAINE HCL (PF) 0.5 % IJ SOLN
20.0000 mL | Freq: Once | INTRAMUSCULAR | Status: DC
Start: 2021-08-01 — End: 2021-08-07
  Filled 2021-08-01: qty 20

## 2021-08-01 MED ORDER — METHYLPREDNISOLONE ACETATE 40 MG/ML IJ SUSP
80.0000 mg | Freq: Once | INTRAMUSCULAR | Status: DC
  Filled 2021-08-01: qty 2

## 2021-08-01 MED ORDER — HEPARIN (PORCINE) 25000 UT/250ML-% IV SOLN
500.0000 [IU]/h | INTRAVENOUS | Status: DC
  Administered 2021-08-01: 500 [IU]/h via INTRAVENOUS
  Filled 2021-08-01: qty 250

## 2021-08-01 MED ORDER — POTASSIUM CHLORIDE CRYS ER 20 MEQ PO TBCR
40.0000 meq | EXTENDED_RELEASE_TABLET | ORAL | Status: AC
  Administered 2021-08-01 (×2): 40 meq via ORAL
  Filled 2021-08-01 (×2): qty 2

## 2021-08-01 NOTE — Progress Notes (Signed)
[place] for warfarin Indication:  LVAD  Allergies  Allergen Reactions   Aspirin Shortness Of Breath, Itching and Rash     Burning sensation (Patient reports he tolerates other NSAIDS)    Bee Venom Hives and Swelling   Lisinopril Cough   Shrimp (Diagnostic) Rash   Tomato Rash    Patient Measurements: Height: _0  (170.2 cm) Weight: 134.1 kg (295 lb 10.2 oz) IBW/kg (Calculated) : 66.1  Vital Signs: Temp: 97.8 F (36.6 C) (10/28 1645) Temp Source: Oral (10/28 1645) BP: 114/97 (10/28 2009) Pulse Rate: 80 (10/28 1645)  Labs: Recent Labs    07/30/21 0304 07/30/21 0859 07/30/21 0859 07/31/21 0339 08/01/21 0540 08/01/21 2041  HGB  --  10.7*   < > 10.2* 9.3*  --   HCT  --  31.7*  --  30.7* 29.1*  --   PLT  --  450*  --  419* 468*  --   LABPROT 19.6*  --   --  18.0* 16.5*  --   INR 1.7*  --   --  1.5* 1.3*  --   HEPARINUNFRC  --   --   --   --   --  <0.10*  CREATININE 1.77*  --   --  1.89* 1.57*  --    < > = values in this interval not displayed.     Estimated Creatinine Clearance: 77.6 mL/min (A) (by C-G formula based on SCr of 1.57 mg/dL (H)).   Medical History: Past Medical History:  Diagnosis Date   Anxiety    Aspirin allergy    Childhood asthma    Chronic systolic CHF (congestive heart failure) ([place])    a. EF 20-25% in 2012. b. EF 45-50% in 10/2011 with nonischemic nuc - presumed NICM. c. 12/2014 Echo: Sev depressed LV fxn, sev dil LV, mild LVH, mild MR, sev dil LA, mildly reduced RV fxn.   CKD (chronic kidney disease) stage 2, GFR 60-89 ml/min    H/O vasectomy 12/2019   High cholesterol    Hypertension    Morbid obesity ([place])    Nephrolithiasis    OSA on CPAP    Paroxysmal atrial fibrillation ([place])    Presumed NICM    a. 04/2014 Myoview: EF 26%, glob HK, sev glob HK, ? prior infarct;  b. Never cathed 2/2 CKD.   Renal cell carcinoma ([place])    a. s/p Rt robotic assisted partial converted to radical nephrectomy on  01/2013.   Troponin level elevated    a. 04/2014, 12/2014: felt due to CHF.   Type II diabetes mellitus ([facility])    Ventricular tachycardia    a. appropriate ICD therapy 12/2017    Medications:  Medications Prior to Admission  Medication Sig Dispense Refill Last Dose   acetaminophen (TYLENOL) 325 MG tablet Take 2 tablets (650 mg total) by mouth every 6 (six) hours as needed for fever.   Past Week   allopurinol (ZYLOPRIM) 100 MG tablet Take 1 tablet (100 mg total) by mouth daily. 90 tablet 3 07/18/2021   ALPRAZolam (XANAX) 0.5 MG tablet Take 1 tablet (0.5 mg total) by mouth 2 (two) times daily. 60 tablet 4 Past Week   amiodarone (PACERONE) 200 MG tablet Take 0.5 tablets (100 mg total) by mouth daily. 30 tablet 3 07/17/2021   apixaban (ELIQUIS) 5 MG TABS tablet Take 1 tablet (5 mg total) by mouth 2 (two) times daily. 60 tablet 3 07/14/2021   diclofenac Sodium (VOLTAREN) 1 % GEL  Apply 2 g topically 4 (four) times daily. 100 g 0 Past Week   docusate sodium (COLACE) 100 MG capsule Take 1 capsule (100 mg total) by mouth 2 (two) times daily. 10 capsule 0 Past Week   empagliflozin (JARDIANCE) 10 MG TABS tablet TAKE 1 TABLET (10 MG TOTAL) BY MOUTH DAILY BEFORE BREAKFAST. 90 tablet 3 07/18/2021   insulin aspart protamine - aspart (NOVOLOG 70/30 MIX) (70-30) 100 UNIT/ML FlexPen Inject 38 Units into the skin 2 (two) times daily. 15 mL 11 07/17/2021   Insulin Syringe-Needle U-100 (TRUEPLUS INSULIN SYRINGE) 31G X 5/16" 0.5 ML MISC USE TO INJECT INSULIN TWICE DAILY. 100 each 11 07/17/2021   losartan (COZAAR) 25 MG tablet Take 0.5 tablets (12.5 mg total) by mouth daily. 45 tablet 3 07/18/2021   metFORMIN (GLUCOPHAGE) 1000 MG tablet Take 1 tablet (1,000 mg total) by mouth 2 (two) times daily with a meal. 60 tablet 11 07/18/2021   pantoprazole (PROTONIX) 40 MG tablet Take 1 tablet (40 mg total) by mouth daily. 30 tablet 0 Past Week   polyethylene glycol (MIRALAX / GLYCOLAX) 17 g packet Take 17 g by mouth daily. 14  each 0 Past Week   sertraline (ZOLOFT) 25 MG tablet Take 1 tablet (25 mg total) by mouth daily. 30 tablet 3 07/18/2021   spironolactone (ALDACTONE) 25 MG tablet Take 1 tablet (25 mg total) by mouth daily. 30 tablet 6 07/18/2021   torsemide (DEMADEX) 20 MG tablet Take 1 tablet (20 mg total) by mouth daily as needed (If wt gain > 3 lb in 24 hr or > 5 lb in 1 week). 30 tablet 2 Past Week   traMADol (ULTRAM) 50 MG tablet Take 2 tablets (100 mg total) by mouth every 12 (twelve) hours as needed for moderate pain. 30 tablet 0 Past Week   traZODone (DESYREL) 100 MG tablet Take 1 tablet (100 mg total) by mouth at bedtime. 30 tablet 0 07/17/2021   nitroGLYCERIN (NITROSTAT) 0.4 MG SL tablet Place 1 tablet (0.4 mg total) under the tongue every 5 (five) minutes x 3 doses as needed for chest pain. 25 tablet 12 Unknown   Scheduled:   allopurinol  100 mg Oral Daily   amiodarone  200 mg Oral Daily   bupivacaine  20 mL Infiltration Once   chlorhexidine gluconate (MEDLINE KIT)  15 mL Mouth Rinse BID   Chlorhexidine Gluconate Cloth  6 each Topical Daily   colchicine  0.6 mg Oral Daily   docusate sodium  200 mg Oral Daily   gabapentin  300 mg Oral Daily   insulin aspart  0-20 Units Subcutaneous TID WC   insulin aspart  0-5 Units Subcutaneous QHS   insulin detemir  30 Units Subcutaneous BID   isosorbide-hydrALAZINE  2 tablet Oral TID   mouth rinse  15 mL Mouth Rinse BID   methylPREDNISolone acetate  80 mg Intra-articular Once   pantoprazole  40 mg Oral Daily   polyethylene glycol  17 g Oral BID   predniSONE  40 mg Oral Q breakfast   senna  1 tablet Oral BID   sertraline  25 mg Oral Daily   sodium chloride flush  10-40 mL Intracatheter Q12H   sodium chloride flush  3 mL Intravenous Q12H   spironolactone  25 mg Oral Daily   traZODone  100 mg Oral QHS   Warfarin - Pharmacist Dosing Inpatient   Does not apply q1600   Infusions:   sodium chloride Stopped (07/29/21 1211)   heparin 500 Units/hr (08/01/21  2025)    Assessment: Patient is status post LVAD placement on 10/17. Warfarin was initiated on 10/18. Most recent INR this morning decreased to 1.5 after increasing dose to 2.5 mg on 10/26. Effect of dose increase likely not yet reflected in INR. Patient is also taking allopurinol, amiodarone, and prednisone. No bleeding reported. CBC stable. Diet increased. Due to subtherapeutic INR, surgery and heart failure agree on adding heparin at 500 units/hr fixed dose with no titrations while awaiting therapeutic INR.  Heparin level tonight came back subtherapeutic (<0.1), on 500 units/hr. No s/sx of bleeding or infusion issues.   Goal of Therapy:  INR 2-2.5 Heparin level <0.3 Monitor platelets by anticoagulation protocol: Yes   Plan:  Already received warfarin 6 mg tonight Continue heparin infusion at 500 units/hr  Monitor daily HL, CBC, and for s/sx of bleed  Thank you for allowing pharmacy to participate in this patient's care.  Antonietta Jewel, PharmD, [facility] Clinical Pharmacist  Phone: [phone] 08/01/2021 9:32 PM  Please check AMION for all [facility] phone numbers After 10:00 PM, call [place] [phone]

## 2021-08-01 NOTE — Consult Note (Signed)
Reason for Consult:Knee pain Referring Physician: Pierre Bali Time called: 1059 Time at bedside: [place]   Jeffrey [name] is an 45 y.o. male.  HPI: Jeffrey [name] has been in the hospital for 2 weeks 2/2 CHF. He began to have a polyarticular gout flare about a week ago, was treated with oral steroids and colchicine. He continues to have problems with his knees that are limiting his mobility. Orthopedic surgery was asked to consult.  Past Medical History:  Diagnosis Date   Anxiety    Aspirin allergy    Childhood asthma    Chronic systolic CHF (congestive heart failure) ([place])    a. EF 20-25% in 2012. b. EF 45-50% in 10/2011 with nonischemic nuc - presumed NICM. c. 12/2014 Echo: Sev depressed LV fxn, sev dil LV, mild LVH, mild MR, sev dil LA, mildly reduced RV fxn.   CKD (chronic kidney disease) stage 2, GFR 60-89 ml/min    H/O vasectomy 12/2019   High cholesterol    Hypertension    Morbid obesity ([place])    Nephrolithiasis    OSA on CPAP    Paroxysmal atrial fibrillation ([place])    Presumed NICM    a. 04/2014 Myoview: EF 26%, glob HK, sev glob HK, ? prior infarct;  b. Never cathed 2/2 CKD.   Renal cell carcinoma ([place])    a. s/p Rt robotic assisted partial converted to radical nephrectomy on 01/2013.   Troponin level elevated    a. 04/2014, 12/2014: felt due to CHF.   Type II diabetes mellitus (HCC)    Ventricular tachycardia    a. appropriate ICD therapy 12/2017    Past Surgical History:  Procedure Laterality Date   APPENDECTOMY  07/2004   BIOPSY  02/10/2021   Procedure: BIOPSY;  Surgeon: Gatha Mayer, MD;  [facility];  Service: Endoscopy;;   CARDIAC CATHETERIZATION N/A 05/17/2015   Procedure: Right/Left Heart Cath and Coronary Angiography;  Surgeon: Jolaine Artist, MD;  Location: [place] [facility];  Service: Cardiovascular;  Laterality: N/A;   CARDIOVERSION N/A 04/30/2021   Procedure: CARDIOVERSION;  Surgeon: Larey Dresser, MD;  Location: [facility] END[facility]COPY;  Service: Cardiovascular;   Laterality: N/A;   CARDIOVERSION N/A 07/27/2021   Procedure: CARDIOVERSION;  Surgeon: Jolaine Artist, MD;  Location: [place];  Service: Cardiovascular;  Laterality: N/A;   COLONOSCOPY WITH PROPOFOL N/A 02/10/2021   Procedure: COLONOSCOPY WITH PROPOFOL;  Surgeon: Gatha Mayer, MD;  [facility];  Service: Endoscopy;  Laterality: N/A;   EP IMPLANTABLE DEVICE N/A 05/17/2015   Procedure: SubQ ICD Implant;  Surgeon: Will Meredith Leeds, MD;  Location: [place] [facility];  Service: Cardiovascular;  Laterality: N/A;   ESOPHAGOGASTRODUODENOSCOPY (EGD) WITH PROPOFOL N/A 02/10/2021   Procedure: ESOPHAGOGASTRODUODENOSCOPY (EGD) WITH PROPOFOL;  Surgeon: Gatha Mayer, MD;  [facility];  Service: Endoscopy;  Laterality: N/A;   INSERTION OF IMPLANTABLE LEFT VENTRICULAR ASSIST DEVICE N/A 07/21/2021   Procedure: INSERTION OF IMPLANTABLE LEFT VENTRICULAR ASSIST DEVICE;  Surgeon: Gaye Pollack, MD;  Location: [place];  Service: Open Heart Surgery;  Laterality: N/A;   RIGHT HEART CATH N/A 05/13/2021   Procedure: RIGHT HEART CATH;  Surgeon: Jolaine Artist, MD;  Location: [place] [facility];  Service: Cardiovascular;  Laterality: N/A;   RIGHT HEART CATH N/A 07/18/2021   Procedure: RIGHT HEART CATH;  Surgeon: Jolaine Artist, MD;  Location: [place] [facility];  Service: Cardiovascular;  Laterality: N/A;   ROBOTIC ASSISTED LAPAROSCOPIC LYSIS OF ADHESION  01/13/2013  Procedure: ROBOTIC ASSISTED LAPAROSCOPIC LYSIS OF ADHESION EXTENSIVE;  Surgeon: Alexis Frock, MD;  [facility];  Service: Urology;;   ROBOTIC ASSITED PARTIAL NEPHRECTOMY Right 01/13/2013   Procedure: ROBOTIC ASSITED PARTIAL NEPHRECTOMY CONVERTED TO ROBOTIC ASSISTED RIGHT RADICAL NEPHRECTOMY;  Surgeon: Alexis Frock, MD;  [facility];  Service: Urology;  Laterality: Right;   SUBQ ICD CHANGEOUT N/A 05/16/2020   Procedure: SUBQ ICD CHANGEOUT;  Surgeon: [name], MD;  Location: [place] [facility];  Service:  Cardiovascular;  Laterality: N/A;   SUBQ ICD CHANGEOUT N/A 05/15/2020   Procedure: SUBQ ICD CHANGEOUT;  Surgeon: [name], MD;  Location: [place] [facility];  Service: Cardiovascular;  Laterality: N/A;   TEE WITHOUT CARDIOVERSION N/A 04/30/2021   Procedure: TRANSESOPHAGEAL ECHOCARDIOGRAM (TEE);  Surgeon: Larey Dresser, MD;  Location: [facility] ENDOSCOPY;  Service: Cardiovascular;  Laterality: N/A;   TEE WITHOUT CARDIOVERSION N/A 07/21/2021   Procedure: TRANSESOPHAGEAL ECHOCARDIOGRAM (TEE);  Surgeon: Gaye Pollack, MD;  Location: [place];  Service: Open Heart Surgery;  Laterality: N/A;   VASECTOMY      Family History  Problem Relation Age of Onset   Hypertension Mother    Diabetes Maternal Aunt    Heart attack Neg Hx    Stroke Neg Hx    Colon cancer Neg Hx    Pancreatic cancer Neg Hx    Stomach cancer Neg Hx    Liver cancer Neg Hx    Esophageal cancer Neg Hx     Social History:  reports that he quit smoking about 6 years ago. His smoking use included cigarettes. He has a 5.50 pack-year smoking history. He has never used smokeless tobacco. He reports that he does not currently use alcohol. He reports that he does not currently use drugs after having used the following drugs: Marijuana. Frequency: 14.00 times per week.  Allergies:  Allergies  Allergen Reactions   Aspirin Shortness Of Breath, Itching and Rash     Burning sensation (Patient reports he tolerates other NSAIDS)    Bee Venom Hives and Swelling   Lisinopril Cough   Shrimp (Diagnostic) Rash   Tomato Rash    Medications: I have reviewed the patient's current medications.  Results for orders placed or performed during the hospital encounter of 07/18/21 (from the past 48 hour(s))  Glucose, capillary     Status: Abnormal   Collection Time: 07/30/21  4:29 PM  Result Value Ref Range   Glucose-Capillary 193 (H) 70 - 99 mg/dL    Comment: Glucose reference range applies only to samples taken after fasting for at least 8  hours.  Glucose, capillary     Status: Abnormal   Collection Time: 07/30/21 10:13 PM  Result Value Ref Range   Glucose-Capillary 159 (H) 70 - 99 mg/dL    Comment: Glucose reference range applies only to samples taken after fasting for at least 8 hours.  Cooxemetry Panel (carboxy, met, total hgb, O2 sat)     Status: Abnormal   Collection Time: 07/31/21  3:35 AM  Result Value Ref Range   Total hemoglobin 11.3 (L) 12.0 - 16.0 g/dL   O2 Saturation 66.5 %   Carboxyhemoglobin 1.2 0.5 - 1.5 %   Methemoglobin 0.9 0.0 - 1.5 %    Comment: Performed at [facility], [place] [address] [place], [place] [address]  Lactate dehydrogenase     Status: Abnormal   Collection Time: 07/31/21  3:39 AM  Result Value Ref Range   LDH 214 (H)  98 - 192 U/L    Comment: Performed at [facility], [place] [address] [place], [place] [address]  Protime-INR     Status: Abnormal   Collection Time: 07/31/21  3:39 AM  Result Value Ref Range   Prothrombin Time 18.0 (H) 11.4 - 15.2 seconds   INR 1.5 (H) 0.8 - 1.2    Comment: (NOTE) INR goal varies based on device and disease states. Performed at [facility], [place] [address] [place], [place] [address]   Basic metabolic panel     Status: Abnormal   Collection Time: 07/31/21  3:39 AM  Result Value Ref Range   Sodium 133 (L) 135 - 145 mmol/L   Potassium 3.5 3.5 - 5.1 mmol/L   Chloride 94 (L) 98 - 111 mmol/L   CO2 29 22 - 32 mmol/L   Glucose, Bld 264 (H) 70 - 99 mg/dL    Comment: Glucose reference range applies only to samples taken after fasting for at least 8 hours.   BUN 65 (H) 6 - 20 mg/dL   Creatinine, Ser 1.89 (H) 0.61 - 1.24 mg/dL   Calcium 10.0 8.9 - 10.3 mg/dL   GFR, Estimated 44 (L) >60 mL/min    Comment: (NOTE) Calculated using the CKD-EPI Creatinine Equation (2021)    Anion gap 10 5 - 15    Comment: Performed at [facility] [address] [place], [place] [address]  CBC     Status: Abnormal   Collection Time: 07/31/21  3:39 AM   Result Value Ref Range   WBC 12.1 (H) 4.0 - 10.5 K/uL   RBC 4.24 4.22 - 5.81 MIL/uL   Hemoglobin 10.2 (L) 13.0 - 17.0 g/dL   HCT 30.7 (L) 39.0 - 52.0 %   MCV 72.4 (L) 80.0 - 100.0 fL   MCH 24.1 (L) 26.0 - 34.0 pg   MCHC 33.2 30.0 - 36.0 g/dL   RDW 16.5 (H) 11.5 - 15.5 %   Platelets 419 (H) 150 - 400 K/uL   nRBC 0.6 (H) 0.0 - 0.2 %    Comment: Performed at [facility] [address], [place], [place] [address]  Magnesium     Status: None   Collection Time: 07/31/21  3:39 AM  Result Value Ref Range   Magnesium 2.0 1.7 - 2.4 mg/dL    Comment: Performed at [facility] [address], [place], [place] [address]  Glucose, capillary     Status: Abnormal   Collection Time: 07/31/21  6:44 AM  Result Value Ref Range   Glucose-Capillary 154 (H) 70 - 99 mg/dL    Comment: Glucose reference range applies only to samples taken after fasting for at least 8 hours.  Glucose, capillary     Status: Abnormal   Collection Time: 07/31/21 11:30 AM  Result Value Ref Range   Glucose-Capillary 137 (H) 70 - 99 mg/dL    Comment: Glucose reference range applies only to samples taken after fasting for at least 8 hours.  Glucose, capillary     Status: Abnormal   Collection Time: 07/31/21  4:05 PM  Result Value Ref Range   Glucose-Capillary 195 (H) 70 - 99 mg/dL    Comment: Glucose reference range applies only to samples taken after fasting for at least 8 hours.  Glucose, capillary     Status: Abnormal   Collection Time: 07/31/21  7:21 PM  Result Value Ref Range   Glucose-Capillary 255 (H) 70 - 99 mg/dL  Comment: Glucose reference range applies only to samples taken after fasting for at least 8 hours.   Comment 1 Notify RN    Comment 2 Document in Chart   Glucose, capillary     Status: Abnormal   Collection Time: 07/31/21  9:22 PM  Result Value Ref Range   Glucose-Capillary 302 (H) 70 - 99 mg/dL    Comment: Glucose reference range applies only to samples taken after fasting for at least  8 hours.   Comment 1 Notify RN    Comment 2 Document in Chart   Lactate dehydrogenase     Status: None   Collection Time: 08/01/21  5:40 AM  Result Value Ref Range   LDH 186 98 - 192 U/L    Comment: Performed at [facility], [place] [address] [place], [place] [address]  Protime-INR     Status: Abnormal   Collection Time: 08/01/21  5:40 AM  Result Value Ref Range   Prothrombin Time 16.5 (H) 11.4 - 15.2 seconds   INR 1.3 (H) 0.8 - 1.2    Comment: (NOTE) INR goal varies based on device and disease states. Performed at [facility], [place] [address] [place], [place] [address]   Cooxemetry Panel (carboxy, met, total hgb, O2 sat)     Status: Abnormal   Collection Time: 08/01/21  5:40 AM  Result Value Ref Range   Total hemoglobin 9.9 (L) 12.0 - 16.0 g/dL   O2 Saturation 67.7 %   Carboxyhemoglobin 1.5 0.5 - 1.5 %   Methemoglobin 0.9 0.0 - 1.5 %    Comment: Performed at [facility], [place] [address], [place], [place] [address]  Basic metabolic panel     Status: Abnormal   Collection Time: 08/01/21  5:40 AM  Result Value Ref Range   Sodium 135 135 - 145 mmol/L   Potassium 3.9 3.5 - 5.1 mmol/L   Chloride 99 98 - 111 mmol/L   CO2 30 22 - 32 mmol/L   Glucose, Bld 144 (H) 70 - 99 mg/dL    Comment: Glucose reference range applies only to samples taken after fasting for at least 8 hours.   BUN 54 (H) 6 - 20 mg/dL   Creatinine, Ser 1.57 (H) 0.61 - 1.24 mg/dL   Calcium 10.4 (H) 8.9 - 10.3 mg/dL   GFR, Estimated 55 (L) >60 mL/min    Comment: (NOTE) Calculated using the CKD-EPI Creatinine Equation (2021)    Anion gap 6 5 - 15    Comment: Performed at [facility] [address], [place], [place] [address]  CBC     Status: Abnormal   Collection Time: 08/01/21  5:40 AM  Result Value Ref Range   WBC 15.9 (H) 4.0 - 10.5 K/uL   RBC 3.96 (L) 4.22 - 5.81 MIL/uL   Hemoglobin 9.3 (L) 13.0 - 17.0 g/dL   HCT 29.1 (L) 39.0 - 52.0 %   MCV 73.5 (L) 80.0 - 100.0 fL   MCH 23.5  (L) 26.0 - 34.0 pg   MCHC 32.0 30.0 - 36.0 g/dL   RDW 16.6 (H) 11.5 - 15.5 %   Platelets 468 (H) 150 - 400 K/uL   nRBC 0.2 0.0 - 0.2 %    Comment: Performed at [facility] [address] [place], [place] [address]  Magnesium     Status: None   Collection Time: 08/01/21  5:40 AM  Result Value Ref Range   Magnesium 2.2 1.7 -  2.4 mg/dL    Comment: Performed at [facility], [place] [address], [place], [place] [address]  Glucose, capillary     Status: Abnormal   Collection Time: 08/01/21  6:20 AM  Result Value Ref Range   Glucose-Capillary 149 (H) 70 - 99 mg/dL    Comment: Glucose reference range applies only to samples taken after fasting for at least 8 hours.   Comment 1 Notify RN    Comment 2 Document in Chart   Uric acid     Status: Abnormal   Collection Time: 08/01/21 10:00 AM  Result Value Ref Range   Uric Acid, Serum 11.5 (H) 3.7 - 8.6 mg/dL    Comment: Performed at [facility] [address], [place], [place] [address]  Glucose, capillary     Status: Abnormal   Collection Time: 08/01/21 11:28 AM  Result Value Ref Range   Glucose-Capillary 259 (H) 70 - 99 mg/dL    Comment: Glucose reference range applies only to samples taken after fasting for at least 8 hours.    No results found.  Review of Systems  HENT:  Negative for ear discharge, ear pain, hearing loss and tinnitus.   Eyes:  Negative for photophobia and pain.  Respiratory:  Negative for cough and shortness of breath.   Cardiovascular:  Negative for chest pain.  Gastrointestinal:  Negative for abdominal pain, nausea and vomiting.  Genitourinary:  Negative for dysuria, flank pain, frequency and urgency.  Musculoskeletal:  Positive for arthralgias (Bilateral knees R>L). Negative for back pain, myalgias and neck pain.  Neurological:  Negative for dizziness and headaches.  Hematological:  Does not bruise/bleed easily.  Psychiatric/Behavioral:  The patient is not nervous/anxious.   Blood pressure (!)  120/95, pulse 80, temperature 97.9 F (36.6 C), temperature source Oral, resp. rate (!) 21, height '5\' 7"'  (1.702 m), weight 134.1 kg, SpO2 96 %. Physical Exam Constitutional:      General: He is not in acute distress.    Appearance: He is well-developed. He is not diaphoretic.  HENT:     Head: Normocephalic and atraumatic.  Eyes:     General: No scleral icterus.       Right eye: No discharge.        Left eye: No discharge.     Conjunctiva/sclera: Conjunctivae normal.  Cardiovascular:     Rate and Rhythm: Normal rate and regular rhythm.  Pulmonary:     Effort: Pulmonary effort is normal. No respiratory distress.  Musculoskeletal:     Cervical back: Normal range of motion.     Comments: RLE No traumatic wounds, ecchymosis, or rash  Severe TTP knee diffusely  No knee or ankle effusion  Sens DPN, SPN, TN intact  Motor EHL, ext, flex, evers 5/5  DP 1+, PT 1+, No significant edema  Skin:    General: Skin is warm and dry.  Neurological:     Mental Status: He is alert.  Psychiatric:        Mood and Affect: Mood normal.        Behavior: Behavior normal.    Assessment/Plan: Gout -- Offered for arthrocentesis of both knees but after first pt declined the left as that one was not nearly as bad.    Lisette Abu, PA-C Orthopedic Surgery [phone] 08/01/2021, 2:30 PM

## 2021-08-01 NOTE — Progress Notes (Signed)
Pt up for morning weight.  Right knee causing 10/10 pain.  Pt has tears in eyes.  2 + assistance.  Pt able to get to chair with difficulty.  Sternal precautions maintained.  Pt placed in chair, oxy 10 given, ice applied.  Pain continues to c/o of "horrible pain" tramadol 100 po given.

## 2021-08-01 NOTE — Progress Notes (Signed)
   Requested ortho consult for knee pain.   Buck Mcaffee NP-C  11:21 AM

## 2021-08-01 NOTE — Plan of Care (Signed)
  Problem: Education: Goal: Knowledge of General Education information will improve Description: Including pain rating scale, medication(s)/side effects and non-pharmacologic comfort measures Outcome: Progressing   Problem: Education: Goal: Knowledge of General Education information will improve Description: Including pain rating scale, medication(s)/side effects and non-pharmacologic comfort measures Outcome: Progressing   Problem: Health Behavior/Discharge Planning: Goal: Ability to manage health-related needs will improve Outcome: Progressing   Problem: Clinical Measurements: Goal: Ability to maintain clinical measurements within normal limits will improve Outcome: Progressing Goal: Will remain free from infection Outcome: Progressing Goal: Diagnostic test results will improve Outcome: Progressing Goal: Respiratory complications will improve Outcome: Progressing Goal: Cardiovascular complication will be avoided Outcome: Progressing   Problem: Activity: Goal: Risk for activity intolerance will decrease Outcome: Progressing   Problem: Nutrition: Goal: Adequate nutrition will be maintained Outcome: Progressing   Problem: Coping: Goal: Level of anxiety will decrease Outcome: Progressing   Problem: Elimination: Goal: Will not experience complications related to bowel motility Outcome: Progressing Goal: Will not experience complications related to urinary retention Outcome: Progressing   Problem: Pain Managment: Goal: General experience of comfort will improve Outcome: Progressing   Problem: Safety: Goal: Ability to remain free from injury will improve Outcome: Progressing   Problem: Skin Integrity: Goal: Risk for impaired skin integrity will decrease Outcome: Progressing   Problem: Education: Goal: Knowledge of the prescribed therapeutic regimen will improve Outcome: Progressing   Problem: Activity: Goal: Risk for activity intolerance will decrease Outcome:  Progressing   Problem: Cardiac: Goal: Ability to maintain an adequate cardiac output will improve Outcome: Progressing   Problem: Coping: Goal: Level of anxiety will decrease Outcome: Progressing   Problem: Fluid Volume: Goal: Risk for excess fluid volume will decrease Outcome: Progressing   Problem: Clinical Measurements: Goal: Ability to maintain clinical measurements within normal limits will improve Outcome: Progressing Goal: Will remain free from infection Outcome: Progressing   Problem: Respiratory: Goal: Will regain and/or maintain adequate ventilation Outcome: Progressing

## 2021-08-01 NOTE — Progress Notes (Signed)
Inpatient Diabetes Program Recommendations  AACE/ADA: New Consensus Statement on Inpatient Glycemic Control (2015)  Target Ranges:  Prepandial:   less than 140 mg/dL      Peak postprandial:   less than 180 mg/dL (1-2 hours)      Critically ill patients:  140 - 180 mg/dL   Lab Results  Component Value Date   GLUCAP 259 (H) 08/01/2021   HGBA1C 5.9 (H) 07/20/2021   Results for BENTLIE, CATANZARO (MRN [id number]) as of 08/01/2021 13:45  Ref. Range 07/31/2021 16:05 07/31/2021 19:21 07/31/2021 21:22 08/01/2021 06:20 08/01/2021 11:28  Glucose-Capillary Latest Ref Range: 70 - 99 mg/dL 195 (H) 255 (H) 302 (H) 149 (H) 259 (H)   Review of Glycemic Control  Diabetes history: type 2 Outpatient Diabetes medications: 70/30 insulin 38 units BID, Jardiance 10 mg daily, Metformin 1000 mg BID Current orders for Inpatient glycemic control: Levemir 30 units BID, Novolog RESISTANT correction scale TID & HS scale  Inpatient Diabetes Program Recommendations:   Noted that postprandial blood sugars continue to be greater than 180 mg/dl.   Recommend adding Novolog 5 units TID with meals if patient eats at least 50% of meal and if blood sugars continue to be elevated.  Harvel Ricks RN BSN CDE Diabetes Coordinator Pager: [phone]  8am-5pm

## 2021-08-01 NOTE — Progress Notes (Signed)
Physical Therapy Treatment Patient Details Name: Jeffrey [name] MRN: [id number] DOB: 1976-03-15 Today's Date: 08/01/2021   History of Present Illness 45 y.o. male with class IV HF admitted 07/18/21 for pre-VAD optimization; has been relatively stable as outpatient with NYHA 3B symptoms. S/p RHC with swan placement 10/14. S/p HM3 LVAD implant on 10/17. Hypoxia overnight 10/19 requiring bipap. 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 Pt underwent cardioversion 10/23. PMH includes poorly controlled HTN, renal cell carcinoma s/p nephrectomy, CKD3, DM2, OSA, gout, morbid obesity, HF (EF<20%).   PT Comments    Mobility progression remains limited by BLE pain (especially R knee). Today's session focused on brief bouts of standing activity; pt only able to tolerate short walk distance, min guard for balance. Pt remains limited by pain, decreased activity tolerance, and impaired balance. Hopeful pt will progress well with strength and endurance once able to mobilize more if pain decreases; if pain persists and functional mobility limited, may require post-acute rehab services at [facility]. Will continue to follow acutely to address established goals.    Recommendations for follow up therapy are one component of a multi-disciplinary discharge planning process, led by the attending physician.  Recommendations may be updated based on patient status, additional functional criteria and insurance authorization.  Follow Up Recommendations  Home health PT     Assistance Recommended at Discharge Intermittent Supervision/Assistance  Equipment Recommendations  Other (comment) (possible bariatric rolling walker)    Recommendations for Other Services       Precautions / Restrictions Precautions Precautions: Fall;Sternal;Other (comment) Precaution Comments: LVAD     Mobility  Bed Mobility Overal bed mobility: Needs Assistance Bed Mobility: Sit to Supine       Sit to supine: Mod assist   General bed  mobility comments: ModA for BLE management    Transfers Overall transfer level: Needs assistance Equipment used: None;Rolling walker (2 wheels) Transfers: Sit to/from Stand Sit to Stand: Min guard           General transfer comment: Initial stand from recliner without DME, min guard for balance; additional sit<>stand from BSC and EOB to RW, min guard for balance; reliant on momentum to power to standing while holding heart pillow    Ambulation/Gait Ambulation/Gait assistance: Min guard Gait Distance (Feet): 8 Feet Assistive device: None;Rolling walker (2 wheels) Gait Pattern/deviations: Step-to pattern;Antalgic;Trunk flexed;Wide base of support Gait velocity: Decreased   General Gait Details: Initial pivotal steps from recliner to [facility] without DME; short gait distance from [facility] to bed with RW and min guard; slow, antalgic gait with wide BOS   Stairs             Wheelchair Mobility    Modified Rankin (Stroke Patients Only)       Balance Overall balance assessment: Needs assistance Sitting-balance support: No upper extremity supported;Feet supported Sitting balance-Leahy Scale: Fair       Standing balance-Leahy Scale: Fair Standing balance comment: can static stand and perform pericare without UE support; static and dynamic stability improved with RW to offload painful LEs                            Cognition Arousal/Alertness: Awake/alert Behavior During Therapy: WFL for tasks assessed/performed Overall Cognitive Status: Within Functional Limits for tasks assessed                                 General Comments: Motivated  despite gout pain        Exercises      General Comments General comments (skin integrity, edema, etc.): Return to supine as ortho present to assess R knee, potential for steroid injection      Pertinent Vitals/Pain Pain Assessment: Faces Faces Pain Scale: Hurts whole lot Pain Location: bilateral knees  (R>L) Pain Descriptors / Indicators: Guarding;Grimacing;Moaning Pain Intervention(s): Monitored during session;Limited activity within patient's tolerance    Home Living                          Prior Function            PT Goals (current goals can now be found in the care plan section) Progress towards PT goals: Progressing toward goals (progression limited by knee pain)    Frequency    Min 3X/week      PT Plan Current plan remains appropriate    Co-evaluation              AM-PAC PT "6 Clicks" Mobility   Outcome Measure  Help needed turning from your back to your side while in a flat bed without using bedrails?: A Lot Help needed moving from lying on your back to sitting on the side of a flat bed without using bedrails?: A Lot Help needed moving to and from a bed to a chair (including a wheelchair)?: A Little Help needed standing up from a chair using your arms (e.g., wheelchair or bedside chair)?: A Little Help needed to walk in hospital room?: A Little Help needed climbing 3-5 steps with a railing? : A Lot 6 Click Score: 15    End of Session Equipment Utilized During Treatment: Oxygen Activity Tolerance: Patient limited by pain Patient left: in bed;with call Ivins/phone within reach Nurse Communication: Mobility status PT Visit Diagnosis: Other abnormalities of gait and mobility (R26.89);Difficulty in walking, not elsewhere classified (R26.2)     Time: 5170-0174 PT Time Calculation (min) (ACUTE ONLY): 31 min  Charges:  $Therapeutic Activity: 23-37 mins                     Jeffrey [name], PT, DPT Acute Rehabilitation Services  Pager [phone] Office [phone]  Jeffrey [name] 08/01/2021, 2:51 PM

## 2021-08-01 NOTE — Patient Outreach (Signed)
08/01/2021 Name: Jeffrey [name] MRN: [id number] DOB: Apr 05, 1976  Referred by: Bo Merino I, NP Reason for referral : No chief complaint on file.   [name] is currently admitted as an inpatient at [facility] team will follow the progress of CHARLES ANDRINGA and follow up upon discharge.   Hughes Better PharmD, CPP High Risk Managed Medicaid [place] [phone]

## 2021-08-01 NOTE — Procedures (Signed)
Procedure: Right knee aspiration and injection   Indication: Right knee effusion(s)   Surgeon: Silvestre Gunner, PA-C   Assist: None   Anesthesia: None   EBL: None   Complications: None   Findings: After risks/benefits explained patient desires to undergo procedure. Consent obtained and time out performed. The right knee was sterilely prepped and aspirated. 40ml reddish fluid obtained. 56ml 0.5% Marcaine and 40mg  depomedrol instilled. Pt tolerated the procedure well.       Lisette Abu, PA-C Orthopedic Surgery [phone]

## 2021-08-01 NOTE — Progress Notes (Signed)
LVAD Coordinator Rounding Note:   Admitted 07/18/21 due to Dr. [name] for elective VAD placement.    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 Cyndia Bent under Destination Therapy criteria due to BMI and chronic kidney disease.    Pt sitting up in recliner. Still having issues with severe gout pain, R knee > L knee. Heating pad over knees. He reports he is still unable to ambulate; says it sometimes takes "14 days" to get over gout flare at home. Encouraged patient to push through pain and participate with PT; says he "just can't".     Report wife is not coming today that she is "overwhelmed" with taking care of home, kids, working, and now learning LVAD equipment and dressing change. Asked that he have her call us if we can be of further assistance. She "just needs today off" he reports.     Vital signs: Temp:  97.9 HR: 80 NSR Doppler:  100 Auto cuff:  86/69 (76) O2 Sat: 96% on 2L/[place] Wt:  277>292>285>296.3>305.5>305.5>302.9>299.8>297.6>294.1>295.6 lbs   LVAD interrogation reveals:  Speed: 6200 Flow: 5.0 Power:  4.9 w PI: 2.5 Hct: 31   Alarms:  none Events: 10 PI events yesterday   Fixed speed:  5900 Low speed limit: 5600   Drive Line: Existing VAD dressing removed and site care performed using sterile technique. Drive line exit site cleaned with Chlora prep applicators x 2, allowed to dry, and gauze dressing with silver strip applied. Exit site with one suture intact, the velour is fully implanted at exit site; small amount dried bloody drainage on silver strip with slight redness, no tenderness, or foul odor noted. Drive line anchor intact x 2. Will advance to MWF dressing changes per VAD Coordinator, Nurse Davonna Belling, or trained caregiver. Next dressing change due 08/04/21.   Labs:  LDH trend: 318>372>312>302>243>225>211>214>186   INR trend: 1.3>1.9>1.9>2.0>2.3>1.9>1.7>1.5>1.3    Anticoagulation Plan: -INR Goal: 2.0 - 2.5  -ASA Dose: none due to allergy     Blood Products:   - 07/21/21 2 units FFP (intra op)   Device: - [employer] single ICD -Therapies: off    Respiratory: Extubated 07/22/21   Nitric Oxide:  Off 07/22/21   Infection:  - sputum culture 07/23/21>>> specimen rejected - urine culture 07/23/21>>>no growth (final) - blood culture 07/23/21>>>no growth final   VAD Education: HM III Patient Handbook delivered to patient; asked him to read prior to formal discharge teaching (probably next week). He verbalized understanding of same. Asked patient to walk as much as possible to increase his endurance and strength; says he is unable to do so now due to gout pain in knees.    Plan/Recommendations:  Call VAD Coordinator if any VAD equipment or drive line issues. Advance to Mon/Wed/Fri dressing changes per VAD Coordinator, Nurse Davonna Belling, or trained caregiver.   Zada Girt RN [place] Coordinator  Office: [phone]  24/7 Pager: [phone]

## 2021-08-01 NOTE — Progress Notes (Addendum)
Advanced Heart Failure VAD Team Note  PCP-Cardiologist: Glori Bickers, MD   Subjective:    10/17: HM3 LVAD implant (DT)  10/18: Extubated 10/23: DC-CV for A fib RVR--> NSR. Milrinone cut back to 0.125 mcg.   10/25: Bidil increased to 2 tabs tid.  10/26: Lasix and milrinone stopped. Had ramp echo with speed increased to 6100 10/27 VAD speed increased to 6200   CO-OX 68%   INR 1.3   Creatinine trending down 1.6   He has been on prednisone for acute gout. WBC 12>15.9  Complaining of knee pain other wise feels ok.   LVAD INTERROGATION:  HeartMate III LVAD:   Flow 5.2 liters /min, speed 6200 power 5 PI 2.7  1 PI event  Objective:    Vital Signs:   Temp:  [97.8 F (36.6 C)-98.6 F (37 C)] 97.8 F (36.6 C) (10/28 0740) Pulse Rate:  [72-187] 72 (10/28 0740) Resp:  [16-31] 17 (10/28 0740) BP: (86-112)/(63-100) 86/69 (10/28 0800) SpO2:  [90 %-97 %] 95 % (10/28 0740) Weight:  [134.1 kg] 134.1 kg (10/28 0621) Last BM Date: 08/01/21 Mean arterial Pressure  70s    Intake/Output:   Intake/Output Summary (Last 24 hours) at 08/01/2021 0903 Last data filed at 08/01/2021 0600 Gross per 24 hour  Intake 296.41 ml  Output 1700 ml  Net -1403.59 ml     Physical Exam  Physical Exam: GENERAL: No acute distress. Sitting in the chair.  HEENT: normal  NECK: Supple, JVP  6-7 .  2+ bilaterally, no bruits.  No lymphadenopathy or thyromegaly appreciated.   CARDIAC:  Mechanical heart sounds with LVAD hum present.  LUNGS:  Clear to auscultation bilaterally.  ABDOMEN:  Soft, round, nontender, positive bowel sounds x4.     LVAD exit site:  Dressing dry and intact.  No erythema or drainage.  Stabilization device present and accurately applied.  Driveline dressing is being changed daily per sterile technique. EXTREMITIES:  Warm and dry, no cyanosis, clubbing, rash or edema . RUE PICC . R and L knee tender NEUROLOGIC:  Alert and oriented x 3.    No aphasia.  No dysarthria.  Affect pleasant.        Telemetry  SR 80s personally reviewed.   Labs   Basic Metabolic Panel: Recent Labs  Lab 07/26/21 0344 07/26/21 1707 07/27/21 0359 07/27/21 1712 07/28/21 0351 07/29/21 0319 07/30/21 0304 07/31/21 0339 08/01/21 0540  NA 135   < > 135   < > 132* 133* 133* 133* 135  K 3.2*   < > 3.5   < > 3.7 4.2 4.0 3.5 3.9  CL 99   < > 98   < > 94* 95* 94* 94* 99  CO2 27   < > 28   < > '27 28 30 29 30  ' GLUCOSE 137*   < > 131*   < > 169* 163* 195* 264* 144*  BUN 31*   < > 34*   < > 41* 53* 64* 65* 54*  CREATININE 1.39*   < > 1.47*   < > 1.59* 1.66* 1.77* 1.89* 1.57*  CALCIUM 10.3   < > 10.3   < > 10.5* 10.8* 10.8* 10.0 10.4*  MG 1.9  --  2.0  --  2.0  --   --  2.0 2.2  PHOS 3.4  --  3.6  --  4.4  --   --   --   --    < > = values in this interval not displayed.  Liver Function Tests: No results for input(s): AST, ALT, ALKPHOS, BILITOT, PROT, ALBUMIN in the last 168 hours.  No results for input(s): LIPASE, AMYLASE in the last 168 hours. No results for input(s): AMMONIA in the last 168 hours.  CBC: Recent Labs  Lab 07/26/21 0344 07/27/21 0359 07/28/21 0351 07/29/21 0319 07/30/21 0859 07/31/21 0339 08/01/21 0540  WBC 11.6* 13.1* 17.1* 17.7* 14.0* 12.1* 15.9*  NEUTROABS 7.5 8.5* 12.6*  --   --   --   --   HGB 10.0* 9.9* 10.7* 10.4* 10.7* 10.2* 9.3*  HCT 32.0* 31.3* 32.0* 30.9* 31.7* 30.7* 29.1*  MCV 76.2* 75.4* 72.7* 71.5* 70.6* 72.4* 73.5*  PLT 309 391 403* 432* 450* 419* 468*    INR: Recent Labs  Lab 07/28/21 0351 07/29/21 0319 07/30/21 0304 07/31/21 0339 08/01/21 0540  INR 2.3* 1.9* 1.7* 1.5* 1.3*    Other results:   Imaging   ECHOCARDIOGRAM LIMITED  Result Date: 07/30/2021    ECHOCARDIOGRAM LIMITED REPORT   Patient Name:   Jeffrey [name] Date of Exam: 07/30/2021 Medical Rec #:  [id number]    Height:       67.0 in Accession #:    [id number]   Weight:       297.6 lb Date of Birth:  [birth year]    BSA:          2.394 m Patient Age:    45 years     BP:            126/102 mmHg Patient Gender: M            HR:           83 bpm. Exam Location:  Inpatient Procedure: 2D Echo, Cardiac Doppler and Color Doppler Indications:    RAMP  History:        Patient has prior history of Echocardiogram examinations, most                 recent 04/30/2021. CHF, Arrythmias:Atrial Fibrillation; Risk                 Factors:Hypertension, Diabetes and Dyslipidemia. CKD. LVAD.  Sonographer:    [name] RDCS Referring Phys: [place]  1. Left ventricular ejection fraction, by estimation, is <20%. The left ventricle has severely decreased function. The left ventricle demonstrates global hypokinesis. The left ventricular internal cavity size was moderately dilated. The septum is shifted towards the RV.  2. Right ventricular systolic function is moderately reduced. The right ventricular size is mildly enlarged.  3. The aortic valve opens 2/3 beats. The aortic valve is tricuspid.  4. LVAD ramp echo, the LVAD inflow cannula is not visualized. FINDINGS  Left Ventricle: Left ventricular ejection fraction, by estimation, is <20%. The left ventricle has severely decreased function. The left ventricle demonstrates global hypokinesis. The left ventricular internal cavity size was moderately dilated. Right Ventricle: The right ventricular size is mildly enlarged. Right ventricular systolic function is moderately reduced. Tricuspid Valve: The tricuspid valve is normal in structure. Tricuspid valve regurgitation is trivial. Aortic Valve: The aortic valve opens 2/3 beats. The aortic valve is tricuspid. Pulmonic Valve: The pulmonic valve was normal in structure. Pulmonic valve regurgitation is trivial. RIGHT VENTRICLE RV S prime:     9.36 cm/s PULMONIC VALVE PV Vmax:       0.97 m/s PV Vmean:      61.000 cm/s PV VTI:        0.129 m PV Peak grad:  3.7  mmHg PV Mean grad:  2.0 mmHg  Dalton McleanMD Electronically signed by Franki Monte Signature Date/Time: 07/30/2021/3:08:34 PM    Final       Medications:     Scheduled Medications:  allopurinol  100 mg Oral Daily   amiodarone  150 mg Intravenous Once   amiodarone  200 mg Oral Daily   chlorhexidine gluconate (MEDLINE KIT)  15 mL Mouth Rinse BID   Chlorhexidine Gluconate Cloth  6 each Topical Daily   colchicine  0.6 mg Oral Daily   docusate sodium  200 mg Oral Daily   gabapentin  300 mg Oral Daily   insulin aspart  0-20 Units Subcutaneous TID WC   insulin aspart  0-5 Units Subcutaneous QHS   insulin detemir  30 Units Subcutaneous BID   isosorbide-hydrALAZINE  2 tablet Oral TID   mouth rinse  15 mL Mouth Rinse BID   pantoprazole  40 mg Oral Daily   polyethylene glycol  17 g Oral BID   potassium chloride  40 mEq Oral Q4H   predniSONE  40 mg Oral Q breakfast   senna  1 tablet Oral BID   sertraline  25 mg Oral Daily   sodium chloride flush  10-40 mL Intracatheter Q12H   sodium chloride flush  3 mL Intravenous Q12H   spironolactone  25 mg Oral Daily   traZODone  100 mg Per Tube QHS   Warfarin - Pharmacist Dosing Inpatient   Does not apply q1600    Infusions:  sodium chloride Stopped (07/29/21 1211)    PRN Medications: sodium chloride, acetaminophen, ondansetron (ZOFRAN) IV, oxyCODONE, sodium chloride flush, sodium chloride flush, traMADol   Assessment/Plan:    1. Acute on Chronic systolic CHF - Nonischemic CM, end-stage - Echo (9/21): EF 20-25%.   - Echo  8/22: EF < 20%, severe LV dilation, moderate RV dysfunction.   - RHC (8/22): well compensated hemodynamics. - s/p S-ICD -Not transplant candidate with BMI - S/p HM3 LVAD 10/17 (DT) - CO-OX  68% off milrinone.  - Hold diuretics for now -On bidil 2 tabs tid.  - No ARB with elevated creatinine.   2. VAD  - s/p HM-3 placement 10/17 - LDH stable 186  - Maps 100s . Continue bidil to 2 bid --Off  diuretics.  - VAD interrogated personally. Parameters stable. - 10/26 Ramp echo with speed increased. Now on 6200  - INR 1.3. Discussed with pharmacy.  -  Continue neurontin for pocket pain  3 Atrial fibrillation: Paroxysmal - Had severe AF with RVR 7/22 s/p DC-CV x 2 - Seen by EP. Not a candidate for ablation due to body habitus  (could consider AVN ablation and CRT, if needed)  - Back in AF with RVR post VAD. Failed RAP x 3.  - 10/23 S/P DC-CV with restoration of NSR. - Now on po amio 200 mg daily.      -INR 1.3    4. CKD stage 3B  - Has solitary kidney.   - Baseline creatinine is 1.6-2.9.  - Creatinine down to 1.6.   5. OSA - Continue CPAP   6. DM2. Poorly controlled - On Jardiance. - Hgb A1c 9.4. - Cover with SSI   7. Hematuria - MRI and cystoscopy ok   8. Polyarticular gout. - severe. Check uric acid now  - Continue prednisone daily for now.    - Continue colchicine daily  - Consult ortho. Mobility limited by gout pain.    I reviewed theLVAD parameters from today, and  compared the results to the patient's prior recorded data.  No programming changes were made.  The LVAD is functioning within specified parameters.  The patient performs LVAD self-test daily.  LVAD interrogation was negative for any significant power changes, alarms or PI events/speed drops.  LVAD equipment check completed and is in good working order.  Back-up equipment present.   LVAD education done on emergency procedures and precautions and reviewed exit site care.  Length of Stay: [place], NP 08/01/2021, 9:03 AM  VAD Team --- VAD ISSUES ONLY--- Pager [phone] (7am - 7am)  Advanced Heart Failure Team  Pager [phone] (M-F; 7a - 5p)   Patient seen and examined with the above-signed Advanced Practice Provider and/or Housestaff. I personally reviewed laboratory data, imaging studies and relevant notes. I independently examined the patient and formulated the important aspects of the plan. I have edited the note to reflect any of my changes or salient points. I have personally discussed the plan with the patient and/or family.  Continues to c/o  gout pain. Now off milrinone. Co-ox stable. Diuretics currently on hold. MAPs improved. INR 1.3  General:  Sitting in chair c/o knee pain  HEENT: normal  Neck: supple. JVP not elevated.  Carotids 2+ bilat; no bruits. No lymphadenopathy or thryomegaly appreciated. Cor: LVAD hum.  Lungs: Clear. Abdomen: obese soft, nontender, non-distended. No hepatosplenomegaly. No bruits or masses. Good bowel sounds. Driveline site clean. Anchor in place.  Extremities: no cyanosis, clubbing, rash. Warm no edema  R knee swollen and sore Neuro: alert & oriented x 3. No focal deficits. Moves all 4 without problem   Hemodynamically stable off milrinone. Volume status ok. Now off diuretics. Remains in NSR after DC-CV. VAD speed optimized with Ramp echo on 10/26. Driveline site ok. VAD interrogated personally. Parameters stable.  - ortho consult for gout - start low-dose heparin until INR up to 1.8 - restart diuretics as needed.   Glori Bickers, MD  5:02 PM

## 2021-08-01 NOTE — Progress Notes (Addendum)
ANTICOAGULATION CONSULT NOTE - Initial Consult  Pharmacy Consult for warfarin Indication:  LVAD  Allergies  Allergen Reactions   Aspirin Shortness Of Breath, Itching and Rash     Burning sensation (Patient reports he tolerates other NSAIDS)    Bee Venom Hives and Swelling   Lisinopril Cough   Shrimp (Diagnostic) Rash   Tomato Rash    Patient Measurements: Height: 5' 7" (170.2 cm) Weight: 134.1 kg (295 lb 10.2 oz) IBW/kg (Calculated) : 66.1  Vital Signs: Temp: 97.9 F (36.6 C) (10/28 1124) Temp Source: Oral (10/28 1124) BP: 120/95 (10/28 1124) Pulse Rate: 80 (10/28 1124)  Labs: Recent Labs    07/30/21 0304 07/30/21 0859 07/30/21 0859 07/31/21 0339 08/01/21 0540  HGB  --  10.7*   < > 10.2* 9.3*  HCT  --  31.7*  --  30.7* 29.1*  PLT  --  450*  --  419* 468*  LABPROT 19.6*  --   --  18.0* 16.5*  INR 1.7*  --   --  1.5* 1.3*  CREATININE 1.77*  --   --  1.89* 1.57*   < > = values in this interval not displayed.     Estimated Creatinine Clearance: 77.6 mL/min (A) (by C-G formula based on SCr of 1.57 mg/dL (H)).   Medical History: Past Medical History:  Diagnosis Date   Anxiety    Aspirin allergy    Childhood asthma    Chronic systolic CHF (congestive heart failure) ([place])    a. EF 20-25% in 2012. b. EF 45-50% in 10/2011 with nonischemic nuc - presumed NICM. c. 12/2014 Echo: Sev depressed LV fxn, sev dil LV, mild LVH, mild MR, sev dil LA, mildly reduced RV fxn.   CKD (chronic kidney disease) stage 2, GFR 60-89 ml/min    H/O vasectomy 12/2019   High cholesterol    Hypertension    Morbid obesity ([place])    Nephrolithiasis    OSA on CPAP    Paroxysmal atrial fibrillation ([place])    Presumed NICM    a. 04/2014 Myoview: EF 26%, glob HK, sev glob HK, ? prior infarct;  b. Never cathed 2/2 CKD.   Renal cell carcinoma ([place])    a. s/p Rt robotic assisted partial converted to radical nephrectomy on 01/2013.   Troponin level elevated    a. 04/2014, 12/2014: felt due to CHF.    Type II diabetes mellitus (HCC)    Ventricular tachycardia    a. appropriate ICD therapy 12/2017    Medications:  Medications Prior to Admission  Medication Sig Dispense Refill Last Dose   acetaminophen (TYLENOL) 325 MG tablet Take 2 tablets (650 mg total) by mouth every 6 (six) hours as needed for fever.   Past Week   allopurinol (ZYLOPRIM) 100 MG tablet Take 1 tablet (100 mg total) by mouth daily. 90 tablet 3 07/18/2021   ALPRAZolam (XANAX) 0.5 MG tablet Take 1 tablet (0.5 mg total) by mouth 2 (two) times daily. 60 tablet 4 Past Week   amiodarone (PACERONE) 200 MG tablet Take 0.5 tablets (100 mg total) by mouth daily. 30 tablet 3 07/17/2021   apixaban (ELIQUIS) 5 MG TABS tablet Take 1 tablet (5 mg total) by mouth 2 (two) times daily. 60 tablet 3 07/14/2021   diclofenac Sodium (VOLTAREN) 1 % GEL Apply 2 g topically 4 (four) times daily. 100 g 0 Past Week   docusate sodium (COLACE) 100 MG capsule Take 1 capsule (100 mg total) by mouth 2 (two) times daily. 10 capsule  0 Past Week   empagliflozin (JARDIANCE) 10 MG TABS tablet TAKE 1 TABLET (10 MG TOTAL) BY MOUTH DAILY BEFORE BREAKFAST. 90 tablet 3 07/18/2021   insulin aspart protamine - aspart (NOVOLOG 70/30 MIX) (70-30) 100 UNIT/ML FlexPen Inject 38 Units into the skin 2 (two) times daily. 15 mL 11 07/17/2021   Insulin Syringe-Needle U-100 (TRUEPLUS INSULIN SYRINGE) 31G X 5/16" 0.5 ML MISC USE TO INJECT INSULIN TWICE DAILY. 100 each 11 07/17/2021   losartan (COZAAR) 25 MG tablet Take 0.5 tablets (12.5 mg total) by mouth daily. 45 tablet 3 07/18/2021   metFORMIN (GLUCOPHAGE) 1000 MG tablet Take 1 tablet (1,000 mg total) by mouth 2 (two) times daily with a meal. 60 tablet 11 07/18/2021   pantoprazole (PROTONIX) 40 MG tablet Take 1 tablet (40 mg total) by mouth daily. 30 tablet 0 Past Week   polyethylene glycol (MIRALAX / GLYCOLAX) 17 g packet Take 17 g by mouth daily. 14 each 0 Past Week   sertraline (ZOLOFT) 25 MG tablet Take 1 tablet (25 mg total)  by mouth daily. 30 tablet 3 07/18/2021   spironolactone (ALDACTONE) 25 MG tablet Take 1 tablet (25 mg total) by mouth daily. 30 tablet 6 07/18/2021   torsemide (DEMADEX) 20 MG tablet Take 1 tablet (20 mg total) by mouth daily as needed (If wt gain > 3 lb in 24 hr or > 5 lb in 1 week). 30 tablet 2 Past Week   traMADol (ULTRAM) 50 MG tablet Take 2 tablets (100 mg total) by mouth every 12 (twelve) hours as needed for moderate pain. 30 tablet 0 Past Week   traZODone (DESYREL) 100 MG tablet Take 1 tablet (100 mg total) by mouth at bedtime. 30 tablet 0 07/17/2021   nitroGLYCERIN (NITROSTAT) 0.4 MG SL tablet Place 1 tablet (0.4 mg total) under the tongue every 5 (five) minutes x 3 doses as needed for chest pain. 25 tablet 12 Unknown   Scheduled:   allopurinol  100 mg Oral Daily   amiodarone  200 mg Oral Daily   chlorhexidine gluconate (MEDLINE KIT)  15 mL Mouth Rinse BID   Chlorhexidine Gluconate Cloth  6 each Topical Daily   colchicine  0.6 mg Oral Daily   docusate sodium  200 mg Oral Daily   gabapentin  300 mg Oral Daily   insulin aspart  0-20 Units Subcutaneous TID WC   insulin aspart  0-5 Units Subcutaneous QHS   insulin detemir  30 Units Subcutaneous BID   isosorbide-hydrALAZINE  2 tablet Oral TID   mouth rinse  15 mL Mouth Rinse BID   pantoprazole  40 mg Oral Daily   polyethylene glycol  17 g Oral BID   predniSONE  40 mg Oral Q breakfast   senna  1 tablet Oral BID   sertraline  25 mg Oral Daily   sodium chloride flush  10-40 mL Intracatheter Q12H   sodium chloride flush  3 mL Intravenous Q12H   spironolactone  25 mg Oral Daily   traZODone  100 mg Oral QHS   Warfarin - Pharmacist Dosing Inpatient   Does not apply q1600   Infusions:   sodium chloride Stopped (07/29/21 1211)    Assessment: Patient is status post LVAD placement on 10/17. Warfarin was initiated on 10/18. Most recent INR this morning decreased to 1.5 after increasing dose to 2.5 mg on 10/26. Effect of dose increase  likely not yet reflected in INR. Patient is also taking allopurinol, amiodarone, and prednisone. No bleeding reported. CBC stable. Diet  increased. Due to subtherapeutic INR, surgery and heart failure agree on adding heparin at 500 units/hr fixed dose with no titrations while awaiting therapeutic INR.  Goal of Therapy:  INR 2-2.5 Heparin level <0.3 Monitor platelets by anticoagulation protocol: Yes   Plan:  Give warfarin 6 mg today Daily INR Heparin level in 6 hours to rule out supratherapeutic heparin  Thank you for allowing pharmacy to participate in this patient's care.  Reatha Harps, PharmD PGY1 Pharmacy Resident 08/01/2021 12:02 PM Check [email or web address] for unit specific pharmacy number

## 2021-08-01 NOTE — Progress Notes (Signed)
Pt has home device and says he is able to put it on himself

## 2021-08-02 DIAGNOSIS — I5023 Acute on chronic systolic (congestive) heart failure: Secondary | ICD-10-CM | POA: Diagnosis not present

## 2021-08-02 DIAGNOSIS — Z95811 Presence of heart assist device: Secondary | ICD-10-CM

## 2021-08-02 LAB — COOXEMETRY PANEL
Carboxyhemoglobin: 1.4 % (ref 0.5–1.5)
Methemoglobin: 1.1 % (ref 0.0–1.5)
O2 Saturation: 68.5 %
Total hemoglobin: 9.6 g/dL — ABNORMAL LOW (ref 12.0–16.0)

## 2021-08-02 LAB — BASIC METABOLIC PANEL
Anion gap: 7 (ref 5–15)
BUN: 54 mg/dL — ABNORMAL HIGH (ref 6–20)
CO2: 26 mmol/L (ref 22–32)
Calcium: 10.6 mg/dL — ABNORMAL HIGH (ref 8.9–10.3)
Chloride: 102 mmol/L (ref 98–111)
Creatinine, Ser: 1.67 mg/dL — ABNORMAL HIGH (ref 0.61–1.24)
GFR, Estimated: 51 mL/min — ABNORMAL LOW (ref >60)
Glucose, Bld: 212 mg/dL — ABNORMAL HIGH (ref 70–99)
Potassium: 5.1 mmol/L (ref 3.5–5.1)
Sodium: 135 mmol/L (ref 135–145)

## 2021-08-02 LAB — CBC
HCT: 28.1 % — ABNORMAL LOW (ref 39.0–52.0)
Hemoglobin: 9.3 g/dL — ABNORMAL LOW (ref 13.0–17.0)
MCH: 24.3 pg — ABNORMAL LOW (ref 26.0–34.0)
MCHC: 33.1 g/dL (ref 30.0–36.0)
MCV: 73.4 fL — ABNORMAL LOW (ref 80.0–100.0)
Platelets: 385 10*3/uL (ref 150–400)
RBC: 3.83 MIL/uL — ABNORMAL LOW (ref 4.22–5.81)
RDW: 17.2 % — ABNORMAL HIGH (ref 11.5–15.5)
WBC: 17.6 10*3/uL — ABNORMAL HIGH (ref 4.0–10.5)
nRBC: 0.2 % (ref 0.0–0.2)

## 2021-08-02 LAB — GLUCOSE, CAPILLARY
Glucose-Capillary: 216 mg/dL — ABNORMAL HIGH (ref 70–99)
Glucose-Capillary: 196 mg/dL — ABNORMAL HIGH (ref 70–99)
Glucose-Capillary: 250 mg/dL — ABNORMAL HIGH (ref 70–99)
Glucose-Capillary: 262 mg/dL — ABNORMAL HIGH (ref 70–99)

## 2021-08-02 LAB — HEPARIN LEVEL (UNFRACTIONATED): Heparin Unfractionated: <0.1 IU/mL — ABNORMAL LOW (ref 0.30–0.70)

## 2021-08-02 LAB — PROTIME-INR
INR: 1.6 — ABNORMAL HIGH (ref 0.8–1.2)
Prothrombin Time: 18.6 seconds — ABNORMAL HIGH (ref 11.4–15.2)

## 2021-08-02 LAB — LACTATE DEHYDROGENASE: LDH: 232 U/L — ABNORMAL HIGH (ref 98–192)

## 2021-08-02 LAB — MAGNESIUM: Magnesium: 2.2 mg/dL (ref 1.7–2.4)

## 2021-08-02 MED ORDER — WARFARIN SODIUM 3 MG PO TABS
6.0000 mg | ORAL_TABLET | Freq: Once | ORAL | Status: AC
  Administered 2021-08-02: 6 mg via ORAL
  Filled 2021-08-02: qty 2

## 2021-08-02 NOTE — Plan of Care (Signed)
  Problem: Education: Goal: Knowledge of General Education information will improve Description: Including pain rating scale, medication(s)/side effects and non-pharmacologic comfort measures Outcome: Progressing   Problem: Health Behavior/Discharge Planning: Goal: Ability to manage health-related needs will improve Outcome: Progressing   Problem: Clinical Measurements: Goal: Ability to maintain clinical measurements within normal limits will improve Outcome: Progressing Goal: Will remain free from infection Outcome: Progressing Goal: Diagnostic test results will improve Outcome: Progressing Goal: Respiratory complications will improve Outcome: Progressing Goal: Cardiovascular complication will be avoided Outcome: Progressing   Problem: Activity: Goal: Risk for activity intolerance will decrease Outcome: Progressing   Problem: Nutrition: Goal: Adequate nutrition will be maintained Outcome: Progressing   Problem: Coping: Goal: Level of anxiety will decrease Outcome: Progressing   Problem: Elimination: Goal: Will not experience complications related to bowel motility Outcome: Progressing Goal: Will not experience complications related to urinary retention Outcome: Progressing   Problem: Pain Managment: Goal: General experience of comfort will improve Outcome: Progressing   Problem: Safety: Goal: Ability to remain free from injury will improve Outcome: Progressing   Problem: Skin Integrity: Goal: Risk for impaired skin integrity will decrease Outcome: Progressing   Problem: Education: Goal: Knowledge of the prescribed therapeutic regimen will improve Outcome: Progressing   Problem: Activity: Goal: Risk for activity intolerance will decrease Outcome: Progressing   Problem: Cardiac: Goal: Ability to maintain an adequate cardiac output will improve Outcome: Progressing   Problem: Coping: Goal: Level of anxiety will decrease Outcome: Progressing   Problem:  Fluid Volume: Goal: Risk for excess fluid volume will decrease Outcome: Progressing   Problem: Clinical Measurements: Goal: Ability to maintain clinical measurements within normal limits will improve Outcome: Progressing Goal: Will remain free from infection Outcome: Progressing   Problem: Respiratory: Goal: Will regain and/or maintain adequate ventilation Outcome: Progressing

## 2021-08-02 NOTE — Progress Notes (Signed)
Patient ID: Jeffrey [name], male   DOB: 05-Jun-1976, 45 y.o.   MRN: [id number]   Advanced Heart Failure VAD Team Note  PCP-Cardiologist: Glori Bickers, MD   Subjective:    10/17: HM3 LVAD implant (DT)  10/18: Extubated 10/23: DC-CV for A fib RVR--> NSR. Milrinone cut back to 0.125 mcg.   10/25: Bidil increased to 2 tabs tid.  10/26: Lasix and milrinone stopped. Had ramp echo with speed increased to 6100 10/27: VAD speed increased to 6200  10/28: Right knee aspirated, steroid injection.   CO-OX 68%   INR 1.6   Creatinine fairly stable 1.67.   He has been on prednisone and colchicine for acute gout right knee, still with significant pain. WBC 12>15.9>16.  No dyspnea.   LVAD INTERROGATION:  HeartMate III LVAD:   Flow 5 liters /min, speed 6200 power 5 PI 2.7  1 PI event   Objective:    Vital Signs:   Temp:  [97.8 F (36.6 C)-98.2 F (36.8 C)] 97.9 F (36.6 C) (10/29 0523) Pulse Rate:  [80] 80 (10/28 1645) Resp:  [20-21] 20 (10/28 2348) BP: (98-120)/(81-97) 118/85 (10/29 0523) SpO2:  [93 %-96 %] 93 % (10/29 0523) Weight:  [135.5 kg] 135.5 kg (10/29 0400) Last BM Date: 08/01/21 Mean arterial Pressure  90s    Intake/Output:   Intake/Output Summary (Last 24 hours) at 08/02/2021 0912 Last data filed at 08/02/2021 0000 Gross per 24 hour  Intake 1166.02 ml  Output 325 ml  Net 841.02 ml     Physical Exam   General: Well appearing this am. NAD.  HEENT: Normal. Neck: Supple, JVP 7-8 cm. Carotids OK.  Cardiac:  Mechanical heart sounds with LVAD hum present.  Lungs:  CTAB, normal effort.  Abdomen:  NT, ND, no HSM. No bruits or masses. +BS  LVAD exit site: Well-healed and incorporated. Dressing dry and intact. No erythema or drainage. Stabilization device present and accurately applied. Driveline dressing changed daily per sterile technique. Extremities:  Warm and dry. No cyanosis, clubbing, rash.  1+ ankle edema.  Right knee warm/swollen.  Neuro:  Alert & oriented x 3.  Cranial nerves grossly intact. Moves all 4 extremities w/o difficulty. Affect pleasant    Telemetry  SR 80s personally reviewed.   Labs   Basic Metabolic Panel: Recent Labs  Lab 07/27/21 0359 07/27/21 1712 07/28/21 0351 07/29/21 0319 07/30/21 0304 07/31/21 0339 08/01/21 0540 08/02/21 0552  NA 135   < > 132* 133* 133* 133* 135 135  K 3.5   < > 3.7 4.2 4.0 3.5 3.9 5.1  CL 98   < > 94* 95* 94* 94* 99 102  CO2 28   < > _0 GLUCOSE 131*   < > 169* 163* 195* 264* 144* 212*  BUN 34*   < > 41* 53* 64* 65* 54* 54*  CREATININE 1.47*   < > 1.59* 1.66* 1.77* 1.89* 1.57* 1.67*  CALCIUM 10.3   < > 10.5* 10.8* 10.8* 10.0 10.4* 10.6*  MG 2.0  --  2.0  --   --  2.0 2.2 2.2  PHOS 3.6  --  4.4  --   --   --   --   --    < > = values in this interval not displayed.    Liver Function Tests: No results for input(s): AST, ALT, ALKPHOS, BILITOT, PROT, ALBUMIN in the last 168 hours.  No results for input(s): LIPASE, AMYLASE in the last 168 hours. No  results for input(s): AMMONIA in the last 168 hours.  CBC: Recent Labs  Lab 07/27/21 0359 07/28/21 0351 07/29/21 0319 07/30/21 0859 07/31/21 0339 08/01/21 0540 08/02/21 0552  WBC 13.1* 17.1* 17.7* 14.0* 12.1* 15.9* 17.6*  NEUTROABS 8.5* 12.6*  --   --   --   --   --   HGB 9.9* 10.7* 10.4* 10.7* 10.2* 9.3* 9.3*  HCT 31.3* 32.0* 30.9* 31.7* 30.7* 29.1* 28.1*  MCV 75.4* 72.7* 71.5* 70.6* 72.4* 73.5* 73.4*  PLT 391 403* 432* 450* 419* 468* 385    INR: Recent Labs  Lab 07/29/21 0319 07/30/21 0304 07/31/21 0339 08/01/21 0540 08/02/21 0552  INR 1.9* 1.7* 1.5* 1.3* 1.6*    Other results:   Imaging   No results found.   Medications:     Scheduled Medications:  allopurinol  100 mg Oral Daily   amiodarone  200 mg Oral Daily   bupivacaine  20 mL Infiltration Once   chlorhexidine gluconate (MEDLINE KIT)  15 mL Mouth Rinse BID   Chlorhexidine Gluconate Cloth  6 each Topical Daily   colchicine  0.6 mg Oral Daily    docusate sodium  200 mg Oral Daily   gabapentin  300 mg Oral Daily   insulin aspart  0-20 Units Subcutaneous TID WC   insulin aspart  0-5 Units Subcutaneous QHS   insulin detemir  30 Units Subcutaneous BID   isosorbide-hydrALAZINE  2 tablet Oral TID   mouth rinse  15 mL Mouth Rinse BID   methylPREDNISolone acetate  80 mg Intra-articular Once   pantoprazole  40 mg Oral Daily   polyethylene glycol  17 g Oral BID   predniSONE  40 mg Oral Q breakfast   senna  1 tablet Oral BID   sertraline  25 mg Oral Daily   sodium chloride flush  10-40 mL Intracatheter Q12H   sodium chloride flush  3 mL Intravenous Q12H   spironolactone  25 mg Oral Daily   traZODone  100 mg Oral QHS   Warfarin - Pharmacist Dosing Inpatient   Does not apply q1600    Infusions:  sodium chloride Stopped (07/29/21 1211)   heparin 500 Units/hr (08/02/21 0000)    PRN Medications: sodium chloride, acetaminophen, ondansetron (ZOFRAN) IV, oxyCODONE, sodium chloride flush, sodium chloride flush, traMADol   Assessment/Plan:    1. Acute on Chronic systolic CHF - Nonischemic CM, end-stage - Echo (9/21): EF 20-25%.   - Echo  8/22: EF < 20%, severe LV dilation, moderate RV dysfunction.   - RHC (8/22): well compensated hemodynamics. - s/p S-ICD - Not transplant candidate with BMI - S/p HM3 LVAD 10/17 (DT) - CO-OX 68% off milrinone.  - Volume looks ok, no Lasix today.  - MAP 90s in setting of knee pain . On Bidil 2 tabs tid and spironolactone, will continue current regimen for now.  - No ARB/ARNI with elevated creatinine.   2. VAD  - s/p HM-3 placement 10/17 - LDH 186 => 232  - Off  diuretics.  - VAD interrogated personally. Parameters stable. - 10/26 Ramp echo with speed increased. Now on 6200  - INR 1.6. Discussed with pharmacy. Heparin gtt until INR 1.8 on warfarin.  - Continue neurontin for pocket pain  3 Atrial fibrillation: Paroxysmal - Had severe AF with RVR 7/22 s/p DC-CV x 2 - Seen by EP. Not a  candidate for ablation due to body habitus  (could consider AVN ablation and CRT, if needed)  - Back in AF with RVR post  VAD. Failed RAP x 3.  - 10/23 S/P DC-CV with restoration of NSR. - Now on po amio 200 mg daily.     - INR 1.6    4. CKD stage 3B  - Has solitary kidney.   - Baseline creatinine is 1.6-2.9.  - Creatinine 1.67 today.   5. OSA - Continue CPAP   6. DM2. Poorly controlled - On Jardiance. - Hgb A1c 9.4. - Cover with SSI   7. Hematuria - MRI and cystoscopy ok   8. Polyarticular gout. - severe.   - Continue prednisone daily for now.    - Continue colchicine daily  - Consulted ortho => knee aspirated and steroid injected. Mobility limited by gout pain.    I reviewed theLVAD parameters from today, and compared the results to the patient's prior recorded data.  No programming changes were made.  The LVAD is functioning within specified parameters.  The patient performs LVAD self-test daily.  LVAD interrogation was negative for any significant power changes, alarms or PI events/speed drops.  LVAD equipment check completed and is in good working order.  Back-up equipment present.   LVAD education done on emergency procedures and precautions and reviewed exit site care.  Length of Stay: [place], MD 08/02/2021, 9:12 AM  VAD Team --- VAD ISSUES ONLY--- Pager [phone] (7am - 7am)  Advanced Heart Failure Team  Pager [phone] (M-F; 7a - 5p)

## 2021-08-02 NOTE — Progress Notes (Signed)
Jeffrey [name] for warfarin Indication:  LVAD  Allergies  Allergen Reactions   Aspirin Shortness Of Breath, Itching and Rash     Burning sensation (Patient reports he tolerates other NSAIDS)    Bee Venom Hives and Swelling   Lisinopril Cough   Shrimp (Diagnostic) Rash   Tomato Rash    Patient Measurements: Height: '5\' 7"'  (170.2 cm) Weight: 135.5 kg (298 lb 11.6 oz) IBW/kg (Calculated) : 66.1  Vital Signs: Temp: 98 F (36.7 C) (10/29 1145) Temp Source: Oral (10/29 1145) BP: 107/94 (10/29 1145) Pulse Rate: 81 (10/29 1145)  Labs: Recent Labs    07/31/21 0339 08/01/21 0540 08/01/21 2041 08/02/21 0552  HGB 10.2* 9.3*  --  9.3*  HCT 30.7* 29.1*  --  28.1*  PLT 419* 468*  --  385  LABPROT 18.0* 16.5*  --  18.6*  INR 1.5* 1.3*  --  1.6*  HEPARINUNFRC  --   --  <0.10* <0.10*  CREATININE 1.89* 1.57*  --  1.67*     Estimated Creatinine Clearance: 73.4 mL/min (A) (by C-G formula based on SCr of 1.67 mg/dL (H)).   Medical History: Past Medical History:  Diagnosis Date   Anxiety    Aspirin allergy    Childhood asthma    Chronic systolic CHF (congestive heart failure) ([place])    a. EF 20-25% in 2012. b. EF 45-50% in 10/2011 with nonischemic nuc - presumed NICM. c. 12/2014 Echo: Sev depressed LV fxn, sev dil LV, mild LVH, mild MR, sev dil LA, mildly reduced RV fxn.   CKD (chronic kidney disease) stage 2, GFR 60-89 ml/min    H/O vasectomy 12/2019   High cholesterol    Hypertension    Morbid obesity ([place])    Nephrolithiasis    OSA on CPAP    Paroxysmal atrial fibrillation ([place])    Presumed NICM    a. 04/2014 Myoview: EF 26%, glob HK, sev glob HK, ? prior infarct;  b. Never cathed 2/2 CKD.   Renal cell carcinoma ([place])    a. s/p Rt robotic assisted partial converted to radical nephrectomy on 01/2013.   Troponin level elevated    a. 04/2014, 12/2014: felt due to CHF.   Type II diabetes mellitus (HCC)    Ventricular tachycardia    a. appropriate  ICD therapy 12/2017    Medications:  Medications Prior to Admission  Medication Sig Dispense Refill Last Dose   acetaminophen (TYLENOL) 325 MG tablet Take 2 tablets (650 mg total) by mouth every 6 (six) hours as needed for fever.   Past Week   allopurinol (ZYLOPRIM) 100 MG tablet Take 1 tablet (100 mg total) by mouth daily. 90 tablet 3 07/18/2021   ALPRAZolam (XANAX) 0.5 MG tablet Take 1 tablet (0.5 mg total) by mouth 2 (two) times daily. 60 tablet 4 Past Week   amiodarone (PACERONE) 200 MG tablet Take 0.5 tablets (100 mg total) by mouth daily. 30 tablet 3 07/17/2021   apixaban (ELIQUIS) 5 MG TABS tablet Take 1 tablet (5 mg total) by mouth 2 (two) times daily. 60 tablet 3 07/14/2021   diclofenac Sodium (VOLTAREN) 1 % GEL Apply 2 g topically 4 (four) times daily. 100 g 0 Past Week   docusate sodium (COLACE) 100 MG capsule Take 1 capsule (100 mg total) by mouth 2 (two) times daily. 10 capsule 0 Past Week   empagliflozin (JARDIANCE) 10 MG TABS tablet TAKE 1 TABLET (10 MG TOTAL) BY MOUTH DAILY BEFORE BREAKFAST. 90 tablet 3  07/18/2021   insulin aspart protamine - aspart (NOVOLOG 70/30 MIX) (70-30) 100 UNIT/ML FlexPen Inject 38 Units into the skin 2 (two) times daily. 15 mL 11 07/17/2021   Insulin Syringe-Needle U-100 (TRUEPLUS INSULIN SYRINGE) 31G X 5/16" 0.5 ML MISC USE TO INJECT INSULIN TWICE DAILY. 100 each 11 07/17/2021   losartan (COZAAR) 25 MG tablet Take 0.5 tablets (12.5 mg total) by mouth daily. 45 tablet 3 07/18/2021   metFORMIN (GLUCOPHAGE) 1000 MG tablet Take 1 tablet (1,000 mg total) by mouth 2 (two) times daily with a meal. 60 tablet 11 07/18/2021   pantoprazole (PROTONIX) 40 MG tablet Take 1 tablet (40 mg total) by mouth daily. 30 tablet 0 Past Week   polyethylene glycol (MIRALAX / GLYCOLAX) 17 g packet Take 17 g by mouth daily. 14 each 0 Past Week   sertraline (ZOLOFT) 25 MG tablet Take 1 tablet (25 mg total) by mouth daily. 30 tablet 3 07/18/2021   spironolactone (ALDACTONE) 25 MG  tablet Take 1 tablet (25 mg total) by mouth daily. 30 tablet 6 07/18/2021   torsemide (DEMADEX) 20 MG tablet Take 1 tablet (20 mg total) by mouth daily as needed (If wt gain > 3 lb in 24 hr or > 5 lb in 1 week). 30 tablet 2 Past Week   traMADol (ULTRAM) 50 MG tablet Take 2 tablets (100 mg total) by mouth every 12 (twelve) hours as needed for moderate pain. 30 tablet 0 Past Week   traZODone (DESYREL) 100 MG tablet Take 1 tablet (100 mg total) by mouth at bedtime. 30 tablet 0 07/17/2021   nitroGLYCERIN (NITROSTAT) 0.4 MG SL tablet Place 1 tablet (0.4 mg total) under the tongue every 5 (five) minutes x 3 doses as needed for chest pain. 25 tablet 12 Unknown   Scheduled:   allopurinol  100 mg Oral Daily   amiodarone  200 mg Oral Daily   bupivacaine  20 mL Infiltration Once   chlorhexidine gluconate (MEDLINE KIT)  15 mL Mouth Rinse BID   Chlorhexidine Gluconate Cloth  6 each Topical Daily   colchicine  0.6 mg Oral Daily   docusate sodium  200 mg Oral Daily   gabapentin  300 mg Oral Daily   insulin aspart  0-20 Units Subcutaneous TID WC   insulin aspart  0-5 Units Subcutaneous QHS   insulin detemir  30 Units Subcutaneous BID   isosorbide-hydrALAZINE  2 tablet Oral TID   mouth rinse  15 mL Mouth Rinse BID   methylPREDNISolone acetate  80 mg Intra-articular Once   pantoprazole  40 mg Oral Daily   polyethylene glycol  17 g Oral BID   predniSONE  40 mg Oral Q breakfast   senna  1 tablet Oral BID   sertraline  25 mg Oral Daily   sodium chloride flush  10-40 mL Intracatheter Q12H   sodium chloride flush  3 mL Intravenous Q12H   spironolactone  25 mg Oral Daily   traZODone  100 mg Oral QHS   warfarin  6 mg Oral ONCE-1600   Warfarin - Pharmacist Dosing Inpatient   Does not apply q1600   Infusions:   sodium chloride Stopped (07/29/21 1211)   heparin 500 Units/hr (08/02/21 0000)    Assessment: Patient is status post LVAD placement on 10/17. Warfarin was initiated on 10/18.  INR this am 1.6  starting to increase with boost doses.   No bleeding reported. CBC stable. S/p DCCV 10/23 begin heparin at 500 units/hr fixed dose with no titrations while awaiting  therapeutic INR.  Heparin level as expected subterapeutic (<0.1), on 500 units/hr. No s/sx of bleeding or infusion issues.   Goal of Therapy:  INR 2-2.5 Heparin level <0.3 Monitor platelets by anticoagulation protocol: Yes   Plan:  Warfarin 6 mg tonight Continue heparin infusion at 500 units/hr  Monitor daily HL, CBC, and for s/sx of bleed   Bonnita Nasuti Pharm.D. CPP, BCPS Clinical Pharmacist [phone] 08/02/2021 3:29 PM    Please check AMION for all [facility] phone numbers After 10:00 PM, call [place] [phone]

## 2021-08-03 ENCOUNTER — Inpatient Hospital Stay (HOSPITAL_COMMUNITY): Payer: Medicaid Other

## 2021-08-03 DIAGNOSIS — R42 Dizziness and giddiness: Secondary | ICD-10-CM | POA: Diagnosis not present

## 2021-08-03 DIAGNOSIS — Z95811 Presence of heart assist device: Secondary | ICD-10-CM | POA: Diagnosis not present

## 2021-08-03 DIAGNOSIS — I5023 Acute on chronic systolic (congestive) heart failure: Secondary | ICD-10-CM | POA: Diagnosis not present

## 2021-08-03 LAB — HEPARIN LEVEL (UNFRACTIONATED): Heparin Unfractionated: <0.1 IU/mL — ABNORMAL LOW (ref 0.30–0.70)

## 2021-08-03 LAB — BASIC METABOLIC PANEL
Anion gap: 9 (ref 5–15)
BUN: 49 mg/dL — ABNORMAL HIGH (ref 6–20)
CO2: 23 mmol/L (ref 22–32)
Calcium: 10.8 mg/dL — ABNORMAL HIGH (ref 8.9–10.3)
Chloride: 105 mmol/L (ref 98–111)
Creatinine, Ser: 1.63 mg/dL — ABNORMAL HIGH (ref 0.61–1.24)
GFR, Estimated: 52 mL/min — ABNORMAL LOW (ref >60)
Glucose, Bld: 206 mg/dL — ABNORMAL HIGH (ref 70–99)
Potassium: 4.9 mmol/L (ref 3.5–5.1)
Sodium: 137 mmol/L (ref 135–145)

## 2021-08-03 LAB — GLUCOSE, CAPILLARY
Glucose-Capillary: 184 mg/dL — ABNORMAL HIGH (ref 70–99)
Glucose-Capillary: 216 mg/dL — ABNORMAL HIGH (ref 70–99)
Glucose-Capillary: 302 mg/dL — ABNORMAL HIGH (ref 70–99)
Glucose-Capillary: 308 mg/dL — ABNORMAL HIGH (ref 70–99)

## 2021-08-03 LAB — CBC
HCT: 27.8 % — ABNORMAL LOW (ref 39.0–52.0)
Hemoglobin: 9 g/dL — ABNORMAL LOW (ref 13.0–17.0)
MCH: 24 pg — ABNORMAL LOW (ref 26.0–34.0)
MCHC: 32.4 g/dL (ref 30.0–36.0)
MCV: 74.1 fL — ABNORMAL LOW (ref 80.0–100.0)
Platelets: 354 10*3/uL (ref 150–400)
RBC: 3.75 MIL/uL — ABNORMAL LOW (ref 4.22–5.81)
RDW: 17.2 % — ABNORMAL HIGH (ref 11.5–15.5)
WBC: 16 10*3/uL — ABNORMAL HIGH (ref 4.0–10.5)
nRBC: 0.2 % (ref 0.0–0.2)

## 2021-08-03 LAB — COOXEMETRY PANEL
Carboxyhemoglobin: 1.4 % (ref 0.5–1.5)
Methemoglobin: 0.9 % (ref 0.0–1.5)
O2 Saturation: 92.3 %
Total hemoglobin: 9.3 g/dL — ABNORMAL LOW (ref 12.0–16.0)

## 2021-08-03 LAB — PROTIME-INR
INR: 1.9 — ABNORMAL HIGH (ref 0.8–1.2)
Prothrombin Time: 21.6 seconds — ABNORMAL HIGH (ref 11.4–15.2)

## 2021-08-03 LAB — LACTATE DEHYDROGENASE: LDH: 236 U/L — ABNORMAL HIGH (ref 98–192)

## 2021-08-03 LAB — MAGNESIUM: Magnesium: 2.1 mg/dL (ref 1.7–2.4)

## 2021-08-03 MED ORDER — MECLIZINE HCL 25 MG PO TABS
25.0000 mg | ORAL_TABLET | Freq: Three times a day (TID) | ORAL | Status: DC | PRN
  Administered 2021-08-03 – 2021-08-08 (×3): 25 mg via ORAL
  Filled 2021-08-03 (×5): qty 1

## 2021-08-03 MED ORDER — HEPARIN SODIUM (PORCINE) 5000 UNIT/ML IJ SOLN
5000.0000 [IU] | Freq: Three times a day (TID) | INTRAMUSCULAR | Status: DC
  Administered 2021-08-03 – 2021-08-04 (×3): 5000 [IU] via SUBCUTANEOUS
  Filled 2021-08-03 (×3): qty 1

## 2021-08-03 MED ORDER — HYDRALAZINE HCL 25 MG PO TABS
25.0000 mg | ORAL_TABLET | Freq: Once | ORAL | Status: AC
  Administered 2021-08-03: 25 mg via ORAL
  Filled 2021-08-03: qty 1

## 2021-08-03 MED ORDER — MORPHINE SULFATE (PF) 2 MG/ML IV SOLN
2.0000 mg | Freq: Once | INTRAVENOUS | Status: AC
  Administered 2021-08-03: 2 mg via INTRAVENOUS
  Filled 2021-08-03: qty 1

## 2021-08-03 MED ORDER — AMLODIPINE BESYLATE 5 MG PO TABS
5.0000 mg | ORAL_TABLET | Freq: Every day | ORAL | Status: DC
  Administered 2021-08-03: 5 mg via ORAL
  Filled 2021-08-03: qty 1

## 2021-08-03 MED ORDER — WARFARIN SODIUM 4 MG PO TABS
4.0000 mg | ORAL_TABLET | Freq: Every day | ORAL | Status: DC
  Administered 2021-08-03 – 2021-08-08 (×6): 4 mg via ORAL
  Filled 2021-08-03 (×6): qty 1

## 2021-08-03 NOTE — Progress Notes (Signed)
Patient ID: Jeffrey [name], male   DOB: November 29, 1975, 45 y.o.   MRN: [id number]   Advanced Heart Failure VAD Team Note  PCP-Cardiologist: Glori Bickers, MD   Subjective:    10/17: HM3 LVAD implant (DT)  10/18: Extubated 10/23: DC-CV for A fib RVR--> NSR. Milrinone cut back to 0.125 mcg.   10/25: Bidil increased to 2 tabs tid.  10/26: Lasix and milrinone stopped. Had ramp echo with speed increased to 6100 10/27: VAD speed increased to 6200  10/28: Right knee aspirated, steroid injection.   CO-OX not accurate today  INR 1.9   Creatinine stable 1.63.    He has been on prednisone and colchicine for acute gout right knee, still with significant pain. WBC 12>15.9>16>16.  No dyspnea.   He developed severe headache last night with dizziness that he describes as a "spinning sensation" and somewhat positional.  He has had vertigo before and has had to take meclizine, he feels like this is similar to prior vertigo.  CT head was negative this morning.   LVAD INTERROGATION:  HeartMate III LVAD:   Flow 4.9 liters /min, speed 6200 power 5 PI 2.9  1 PI event in last 24 hrs   Objective:    Vital Signs:   Temp:  [98 F (36.7 C)-98.3 F (36.8 C)] 98.3 F (36.8 C) (10/30 0800) Pulse Rate:  [72-81] 72 (10/30 0432) Resp:  [16-20] 16 (10/30 0432) BP: (107-128)/(78-94) 107/81 (10/30 0432) SpO2:  [90 %-99 %] 96 % (10/30 0432) Weight:  [754 kg] 133 kg (10/30 0236) Last BM Date: 08/02/21 Mean arterial Pressure 90s-100s    Intake/Output:   Intake/Output Summary (Last 24 hours) at 08/03/2021 0851 Last data filed at 08/03/2021 0800 Gross per 24 hour  Intake 903.49 ml  Output 900 ml  Net 3.49 ml     Physical Exam   General: Well appearing this am. NAD.  HEENT: Normal. Neck: Supple, JVP 7-8 cm. Carotids OK.  Cardiac:  Mechanical heart sounds with LVAD hum present.  Lungs:  CTAB, normal effort.  Abdomen:  NT, ND, no HSM. No bruits or masses. +BS  LVAD exit site: Well-healed and  incorporated. Dressing dry and intact. No erythema or drainage. Stabilization device present and accurately applied. Driveline dressing changed daily per sterile technique. Extremities:  Warm and dry. No cyanosis, clubbing, rash. 1+ ankle edema. Right knee swollen, warm, tender.  Neuro:  Alert & oriented x 3. Cranial nerves grossly intact. Moves all 4 extremities w/o difficulty. Affect pleasant     Telemetry  SR 80s personally reviewed.   Labs   Basic Metabolic Panel: Recent Labs  Lab 07/28/21 0351 07/29/21 0319 07/30/21 0304 07/31/21 0339 08/01/21 0540 08/02/21 0552 08/03/21 0152  NA 132*   < > 133* 133* 135 135 137  K 3.7   < > 4.0 3.5 3.9 5.1 4.9  CL 94*   < > 94* 94* 99 102 105  CO2 27   < > _0 GLUCOSE 169*   < > 195* 264* 144* 212* 206*  BUN 41*   < > 64* 65* 54* 54* 49*  CREATININE 1.59*   < > 1.77* 1.89* 1.57* 1.67* 1.63*  CALCIUM 10.5*   < > 10.8* 10.0 10.4* 10.6* 10.8*  MG 2.0  --   --  2.0 2.2 2.2 2.1  PHOS 4.4  --   --   --   --   --   --    < > = values  in this interval not displayed.    Liver Function Tests: No results for input(s): AST, ALT, ALKPHOS, BILITOT, PROT, ALBUMIN in the last 168 hours.  No results for input(s): LIPASE, AMYLASE in the last 168 hours. No results for input(s): AMMONIA in the last 168 hours.  CBC: Recent Labs  Lab 07/28/21 0351 07/29/21 0319 07/30/21 0859 07/31/21 0339 08/01/21 0540 08/02/21 0552 08/03/21 0152  WBC 17.1*   < > 14.0* 12.1* 15.9* 17.6* 16.0*  NEUTROABS 12.6*  --   --   --   --   --   --   HGB 10.7*   < > 10.7* 10.2* 9.3* 9.3* 9.0*  HCT 32.0*   < > 31.7* 30.7* 29.1* 28.1* 27.8*  MCV 72.7*   < > 70.6* 72.4* 73.5* 73.4* 74.1*  PLT 403*   < > 450* 419* 468* 385 354   < > = values in this interval not displayed.    INR: Recent Labs  Lab 07/30/21 0304 07/31/21 0339 08/01/21 0540 08/02/21 0552 08/03/21 0152  INR 1.7* 1.5* 1.3* 1.6* 1.9*    Other results:   Imaging   CT HEAD WO CONTRAST  (5MM)  Result Date: 08/03/2021 CLINICAL DATA:  Nonspecific dizziness EXAM: CT HEAD WITHOUT CONTRAST TECHNIQUE: Contiguous axial images were obtained from the base of the skull through the vertex without intravenous contrast. COMPARISON:  02/11/2016 FINDINGS: Brain: No evidence of acute infarction, hemorrhage, hydrocephalus, extra-axial collection or mass lesion/mass effect. Vascular: No hyperdense vessel or unexpected calcification. Skull: Normal. Negative for fracture or focal lesion. Sinuses/Orbits: No acute finding. IMPRESSION: Negative head CT. Electronically Signed   By: Jorje Guild M.D.   On: 08/03/2021 05:17     Medications:     Scheduled Medications:  allopurinol  100 mg Oral Daily   amiodarone  200 mg Oral Daily   amLODipine  5 mg Oral Daily   bupivacaine  20 mL Infiltration Once   chlorhexidine gluconate (MEDLINE KIT)  15 mL Mouth Rinse BID   Chlorhexidine Gluconate Cloth  6 each Topical Daily   colchicine  0.6 mg Oral Daily   docusate sodium  200 mg Oral Daily   gabapentin  300 mg Oral Daily   heparin injection (subcutaneous)  5,000 Units Subcutaneous Q8H   insulin aspart  0-20 Units Subcutaneous TID WC   insulin aspart  0-5 Units Subcutaneous QHS   insulin detemir  30 Units Subcutaneous BID   isosorbide-hydrALAZINE  2 tablet Oral TID   mouth rinse  15 mL Mouth Rinse BID   methylPREDNISolone acetate  80 mg Intra-articular Once   pantoprazole  40 mg Oral Daily   polyethylene glycol  17 g Oral BID   predniSONE  40 mg Oral Q breakfast   senna  1 tablet Oral BID   sertraline  25 mg Oral Daily   sodium chloride flush  10-40 mL Intracatheter Q12H   sodium chloride flush  3 mL Intravenous Q12H   spironolactone  25 mg Oral Daily   traZODone  100 mg Oral QHS   Warfarin - Pharmacist Dosing Inpatient   Does not apply q1600    Infusions:  sodium chloride Stopped (07/29/21 1211)    PRN Medications: sodium chloride, acetaminophen, meclizine, ondansetron (ZOFRAN) IV,  oxyCODONE, sodium chloride flush, sodium chloride flush, traMADol   Assessment/Plan:    1. Acute on Chronic systolic CHF - Nonischemic CM, end-stage - Echo (9/21): EF 20-25%.   - Echo  8/22: EF < 20%, severe LV dilation, moderate RV dysfunction.   -  RHC (8/22): well compensated hemodynamics. - s/p S-ICD - Not transplant candidate with BMI - S/p HM3 LVAD 10/17 (DT) - CO-OX 68% off milrinone.  - Volume looks ok, no Lasix today.  - MAP 90s-100s today. On Bidil 2 tabs tid and spironolactone 25 mg daily, will add amlodipine 5 mg daily.   - No ARB/ARNI with elevated creatinine.   2. VAD  - s/p HM-3 placement 10/17 - LDH 186 => 232 => 236.  - Off  diuretics.  - VAD interrogated personally. Parameters stable. - 10/26 Ramp echo with speed increased. Now on 6200  - INR 1.9 on warfarin.  Stop heparin gtt today, start [place] heparin.  - Continue neurontin for pocket pain  3 Atrial fibrillation: Paroxysmal - Had severe AF with RVR 7/22 s/p DC-CV x 2 - Seen by EP. Not a candidate for ablation due to body habitus  (could consider AVN ablation and CRT, if needed)  - Back in AF with RVR post VAD. Failed RAP x 3.  - 10/23 S/P DC-CV with restoration of NSR. - Now on po amio 200 mg daily.     - INR 1.9   4. CKD stage 3B  - Has solitary kidney.   - Baseline creatinine is 1.6-2.9.  - Creatinine 1.63 today.   5. OSA - Continue CPAP   6. DM2. Poorly controlled - On Jardiance. - Hgb A1c 9.4. - Cover with SSI   7. Hematuria - MRI and cystoscopy ok   8. Polyarticular gout. - severe, still with significant pain and has been unable to stand.   - Continue prednisone daily.    - Continue colchicine daily.   - Consulted ortho => knee aspirated and steroid injected. Mobility still significantly limited by gout pain.   9. Headache/vertigo - CT head negative.  - Has history of vertigo with prior meclizine use.  - Will start meclizine prn, give dose now.    I reviewed theLVAD parameters from  today, and compared the results to the patient's prior recorded data.  No programming changes were made.  The LVAD is functioning within specified parameters.  The patient performs LVAD self-test daily.  LVAD interrogation was negative for any significant power changes, alarms or PI events/speed drops.  LVAD equipment check completed and is in good working order.  Back-up equipment present.   LVAD education done on emergency procedures and precautions and reviewed exit site care.  Length of Stay: [place], MD 08/03/2021, 8:51 AM  VAD Team --- VAD ISSUES ONLY--- Pager [phone] (7am - 7am)  Advanced Heart Failure Team  Pager [phone] (M-F; 7a - 5p)

## 2021-08-03 NOTE — Progress Notes (Signed)
No further dizziness nor headache presented.

## 2021-08-03 NOTE — Progress Notes (Signed)
Pt woke up with headache and then felt dizzy after going to bathroom with increase blood pressure.  Sarah and Dr. Aundra Dubin notified. Morphine and hydralazine ordered, and advised to f/u in 1 hour if no relieve.  Pt still felt dizzy and pain 6/10 .  Notified vad coordinator, and CT of head order, and Pt taken to CT scan.   Pt resting comfortably in bed after scan.

## 2021-08-03 NOTE — Progress Notes (Signed)
Still complaining of dizziness  , claimed to have vertigo. MD made aware with order. Meclizine  po given. Continue to monitor.

## 2021-08-03 NOTE — Progress Notes (Signed)
[place] for warfarin Indication:  LVAD  Allergies  Allergen Reactions   Aspirin Shortness Of Breath, Itching and Rash     Burning sensation (Patient reports he tolerates other NSAIDS)    Bee Venom Hives and Swelling   Lisinopril Cough   Shrimp (Diagnostic) Rash   Tomato Rash    Patient Measurements: Height: _0  (170.2 cm) Weight: 133 kg (293 lb 3.4 oz) IBW/kg (Calculated) : 66.1  Vital Signs: Temp: 98.1 F (36.7 C) (10/30 1120) Temp Source: Oral (10/30 1120) BP: 107/81 (10/30 0432) Pulse Rate: 72 (10/30 0432)  Labs: Recent Labs    08/01/21 0540 08/01/21 2041 08/02/21 0552 08/03/21 0152  HGB 9.3*  --  9.3* 9.0*  HCT 29.1*  --  28.1* 27.8*  PLT 468*  --  385 354  LABPROT 16.5*  --  18.6* 21.6*  INR 1.3*  --  1.6* 1.9*  HEPARINUNFRC  --  <0.10* <0.10* <0.10*  CREATININE 1.57*  --  1.67* 1.63*     Estimated Creatinine Clearance: 74.4 mL/min (A) (by C-G formula based on SCr of 1.63 mg/dL (H)).   Medical History: Past Medical History:  Diagnosis Date   Anxiety    Aspirin allergy    Childhood asthma    Chronic systolic CHF (congestive heart failure) ([place])    a. EF 20-25% in 2012. b. EF 45-50% in 10/2011 with nonischemic nuc - presumed NICM. c. 12/2014 Echo: Sev depressed LV fxn, sev dil LV, mild LVH, mild MR, sev dil LA, mildly reduced RV fxn.   CKD (chronic kidney disease) stage 2, GFR 60-89 ml/min    H/O vasectomy 12/2019   High cholesterol    Hypertension    Morbid obesity ([place])    Nephrolithiasis    OSA on CPAP    Paroxysmal atrial fibrillation ([place])    Presumed NICM    a. 04/2014 Myoview: EF 26%, glob HK, sev glob HK, ? prior infarct;  b. Never cathed 2/2 CKD.   Renal cell carcinoma ([place])    a. s/p Rt robotic assisted partial converted to radical nephrectomy on 01/2013.   Troponin level elevated    a. 04/2014, 12/2014: felt due to CHF.   Type II diabetes mellitus ([facility])    Ventricular tachycardia    a. appropriate  ICD therapy 12/2017    Medications:  Medications Prior to Admission  Medication Sig Dispense Refill Last Dose   acetaminophen (TYLENOL) 325 MG tablet Take 2 tablets (650 mg total) by mouth every 6 (six) hours as needed for fever.   Past Week   allopurinol (ZYLOPRIM) 100 MG tablet Take 1 tablet (100 mg total) by mouth daily. 90 tablet 3 07/18/2021   ALPRAZolam (XANAX) 0.5 MG tablet Take 1 tablet (0.5 mg total) by mouth 2 (two) times daily. 60 tablet 4 Past Week   amiodarone (PACERONE) 200 MG tablet Take 0.5 tablets (100 mg total) by mouth daily. 30 tablet 3 07/17/2021   apixaban (ELIQUIS) 5 MG TABS tablet Take 1 tablet (5 mg total) by mouth 2 (two) times daily. 60 tablet 3 07/14/2021   diclofenac Sodium (VOLTAREN) 1 % GEL Apply 2 g topically 4 (four) times daily. 100 g 0 Past Week   docusate sodium (COLACE) 100 MG capsule Take 1 capsule (100 mg total) by mouth 2 (two) times daily. 10 capsule 0 Past Week   empagliflozin (JARDIANCE) 10 MG TABS tablet TAKE 1 TABLET (10 MG TOTAL) BY MOUTH DAILY BEFORE BREAKFAST. 90 tablet 3 07/18/2021  insulin aspart protamine - aspart (NOVOLOG 70/30 MIX) (70-30) 100 UNIT/ML FlexPen Inject 38 Units into the skin 2 (two) times daily. 15 mL 11 07/17/2021   Insulin Syringe-Needle U-100 (TRUEPLUS INSULIN SYRINGE) 31G X 5/16" 0.5 ML MISC USE TO INJECT INSULIN TWICE DAILY. 100 each 11 07/17/2021   losartan (COZAAR) 25 MG tablet Take 0.5 tablets (12.5 mg total) by mouth daily. 45 tablet 3 07/18/2021   metFORMIN (GLUCOPHAGE) 1000 MG tablet Take 1 tablet (1,000 mg total) by mouth 2 (two) times daily with a meal. 60 tablet 11 07/18/2021   pantoprazole (PROTONIX) 40 MG tablet Take 1 tablet (40 mg total) by mouth daily. 30 tablet 0 Past Week   polyethylene glycol (MIRALAX / GLYCOLAX) 17 g packet Take 17 g by mouth daily. 14 each 0 Past Week   sertraline (ZOLOFT) 25 MG tablet Take 1 tablet (25 mg total) by mouth daily. 30 tablet 3 07/18/2021   spironolactone (ALDACTONE) 25 MG  tablet Take 1 tablet (25 mg total) by mouth daily. 30 tablet 6 07/18/2021   torsemide (DEMADEX) 20 MG tablet Take 1 tablet (20 mg total) by mouth daily as needed (If wt gain > 3 lb in 24 hr or > 5 lb in 1 week). 30 tablet 2 Past Week   traMADol (ULTRAM) 50 MG tablet Take 2 tablets (100 mg total) by mouth every 12 (twelve) hours as needed for moderate pain. 30 tablet 0 Past Week   traZODone (DESYREL) 100 MG tablet Take 1 tablet (100 mg total) by mouth at bedtime. 30 tablet 0 07/17/2021   nitroGLYCERIN (NITROSTAT) 0.4 MG SL tablet Place 1 tablet (0.4 mg total) under the tongue every 5 (five) minutes x 3 doses as needed for chest pain. 25 tablet 12 Unknown   Scheduled:   allopurinol  100 mg Oral Daily   amiodarone  200 mg Oral Daily   amLODipine  5 mg Oral Daily   bupivacaine  20 mL Infiltration Once   chlorhexidine gluconate (MEDLINE KIT)  15 mL Mouth Rinse BID   Chlorhexidine Gluconate Cloth  6 each Topical Daily   colchicine  0.6 mg Oral Daily   docusate sodium  200 mg Oral Daily   gabapentin  300 mg Oral Daily   heparin injection (subcutaneous)  5,000 Units Subcutaneous Q8H   insulin aspart  0-20 Units Subcutaneous TID WC   insulin aspart  0-5 Units Subcutaneous QHS   insulin detemir  30 Units Subcutaneous BID   isosorbide-hydrALAZINE  2 tablet Oral TID   mouth rinse  15 mL Mouth Rinse BID   methylPREDNISolone acetate  80 mg Intra-articular Once   pantoprazole  40 mg Oral Daily   polyethylene glycol  17 g Oral BID   predniSONE  40 mg Oral Q breakfast   senna  1 tablet Oral BID   sertraline  25 mg Oral Daily   sodium chloride flush  10-40 mL Intracatheter Q12H   sodium chloride flush  3 mL Intravenous Q12H   spironolactone  25 mg Oral Daily   traZODone  100 mg Oral QHS   Warfarin - Pharmacist Dosing Inpatient   Does not apply q1600   Infusions:   sodium chloride Stopped (07/29/21 1211)    Assessment: Patient is status post LVAD placement on 10/17. Warfarin was initiated on  10/18.   No bleeding reported. CBC stable. S/p DCCV 10/23 begin heparin at 500 units/hr fixed dose with no titrations while awaiting therapeutic INR. INR 1.9 after 2 boost doses almost to goal  Heparin level as expected subterapeutic (<0.1), on 500 units/hr. No s/sx of bleeding or infusion issues.   Goal of Therapy:  INR 2-2.5 Heparin level <0.3 Monitor platelets by anticoagulation protocol: Yes   Plan:  Warfarin 4 mg daily  Stop heparin infusion Monitor daily Protime  CBC, and for s/sx of bleed   Bonnita Nasuti Pharm.D. CPP, BCPS Clinical Pharmacist [phone] 08/03/2021 2:50 PM

## 2021-08-04 DIAGNOSIS — I5023 Acute on chronic systolic (congestive) heart failure: Secondary | ICD-10-CM | POA: Diagnosis not present

## 2021-08-04 DIAGNOSIS — Z95811 Presence of heart assist device: Secondary | ICD-10-CM | POA: Diagnosis not present

## 2021-08-04 DIAGNOSIS — I5022 Chronic systolic (congestive) heart failure: Secondary | ICD-10-CM | POA: Diagnosis not present

## 2021-08-04 LAB — CBC
HCT: 28.8 % — ABNORMAL LOW (ref 39.0–52.0)
Hemoglobin: 9.3 g/dL — ABNORMAL LOW (ref 13.0–17.0)
MCH: 23.8 pg — ABNORMAL LOW (ref 26.0–34.0)
MCHC: 32.3 g/dL (ref 30.0–36.0)
MCV: 73.8 fL — ABNORMAL LOW (ref 80.0–100.0)
Platelets: 356 10*3/uL (ref 150–400)
RBC: 3.9 MIL/uL — ABNORMAL LOW (ref 4.22–5.81)
RDW: 17.4 % — ABNORMAL HIGH (ref 11.5–15.5)
WBC: 14.4 10*3/uL — ABNORMAL HIGH (ref 4.0–10.5)
nRBC: 0.3 % — ABNORMAL HIGH (ref 0.0–0.2)

## 2021-08-04 LAB — LACTATE DEHYDROGENASE: LDH: 176 U/L (ref 98–192)

## 2021-08-04 LAB — BASIC METABOLIC PANEL
Anion gap: 6 (ref 5–15)
BUN: 36 mg/dL — ABNORMAL HIGH (ref 6–20)
CO2: 27 mmol/L (ref 22–32)
Calcium: 10.4 mg/dL — ABNORMAL HIGH (ref 8.9–10.3)
Chloride: 102 mmol/L (ref 98–111)
Creatinine, Ser: 1.53 mg/dL — ABNORMAL HIGH (ref 0.61–1.24)
GFR, Estimated: 56 mL/min — ABNORMAL LOW (ref >60)
Glucose, Bld: 270 mg/dL — ABNORMAL HIGH (ref 70–99)
Potassium: 4.1 mmol/L (ref 3.5–5.1)
Sodium: 135 mmol/L (ref 135–145)

## 2021-08-04 LAB — COOXEMETRY PANEL
Carboxyhemoglobin: 1.4 % (ref 0.5–1.5)
Methemoglobin: 1 % (ref 0.0–1.5)
O2 Saturation: 68.9 %
Total hemoglobin: 10.3 g/dL — ABNORMAL LOW (ref 12.0–16.0)

## 2021-08-04 LAB — BODY FLUID CULTURE W GRAM STAIN: Culture: NO GROWTH

## 2021-08-04 LAB — PROTIME-INR
INR: 2.2 — ABNORMAL HIGH (ref 0.8–1.2)
Prothrombin Time: 24.8 seconds — ABNORMAL HIGH (ref 11.4–15.2)

## 2021-08-04 LAB — GLUCOSE, CAPILLARY
Glucose-Capillary: 236 mg/dL — ABNORMAL HIGH (ref 70–99)
Glucose-Capillary: 191 mg/dL — ABNORMAL HIGH (ref 70–99)
Glucose-Capillary: 394 mg/dL — ABNORMAL HIGH (ref 70–99)
Glucose-Capillary: 282 mg/dL — ABNORMAL HIGH (ref 70–99)

## 2021-08-04 LAB — BRAIN NATRIURETIC PEPTIDE: B Natriuretic Peptide: 687.3 pg/mL — ABNORMAL HIGH (ref 0.0–100.0)

## 2021-08-04 LAB — MAGNESIUM: Magnesium: 1.9 mg/dL (ref 1.7–2.4)

## 2021-08-04 MED ORDER — INSULIN ASPART 100 UNIT/ML IJ SOLN
0.0000 [IU] | Freq: Three times a day (TID) | INTRAMUSCULAR | Status: DC
  Administered 2021-08-04: 4 [IU] via SUBCUTANEOUS
  Administered 2021-08-04: 20 [IU] via SUBCUTANEOUS
  Administered 2021-08-05: 7 [IU] via SUBCUTANEOUS
  Administered 2021-08-05: 11 [IU] via SUBCUTANEOUS
  Administered 2021-08-05: 4 [IU] via SUBCUTANEOUS
  Administered 2021-08-06: 11 [IU] via SUBCUTANEOUS
  Administered 2021-08-06 (×2): 4 [IU] via SUBCUTANEOUS
  Administered 2021-08-07: 3 [IU] via SUBCUTANEOUS
  Administered 2021-08-07 (×2): 4 [IU] via SUBCUTANEOUS
  Administered 2021-08-08: 3 [IU] via SUBCUTANEOUS

## 2021-08-04 MED ORDER — INSULIN ASPART 100 UNIT/ML IJ SOLN
6.0000 [IU] | Freq: Three times a day (TID) | INTRAMUSCULAR | Status: DC
  Administered 2021-08-05 – 2021-08-08 (×11): 6 [IU] via SUBCUTANEOUS

## 2021-08-04 MED ORDER — INSULIN ASPART 100 UNIT/ML IJ SOLN
4.0000 [IU] | Freq: Three times a day (TID) | INTRAMUSCULAR | Status: DC
  Administered 2021-08-04 (×2): 4 [IU] via SUBCUTANEOUS

## 2021-08-04 MED ORDER — LOSARTAN POTASSIUM 25 MG PO TABS: 25.0000 mg | ORAL_TABLET | Freq: Every day | ORAL | Status: DC

## 2021-08-04 MED ORDER — INSULIN DETEMIR 100 UNIT/ML ~~LOC~~ SOLN
32.0000 [IU] | Freq: Two times a day (BID) | SUBCUTANEOUS | Status: DC
  Administered 2021-08-04: 32 [IU] via SUBCUTANEOUS
  Filled 2021-08-04 (×2): qty 0.32

## 2021-08-04 MED ORDER — SACUBITRIL-VALSARTAN 24-26 MG PO TABS
1.0000 | ORAL_TABLET | Freq: Two times a day (BID) | ORAL | Status: DC
  Administered 2021-08-04 (×2): 1 via ORAL
  Filled 2021-08-04 (×2): qty 1

## 2021-08-04 MED ORDER — INSULIN DETEMIR 100 UNIT/ML ~~LOC~~ SOLN
40.0000 [IU] | Freq: Two times a day (BID) | SUBCUTANEOUS | Status: DC
  Administered 2021-08-04 – 2021-08-08 (×8): 40 [IU] via SUBCUTANEOUS
  Filled 2021-08-04 (×10): qty 0.4

## 2021-08-04 NOTE — Progress Notes (Signed)
VAD Discharge Teaching Note:  Discharge VAD teaching completed with Jeffrey [name] and his wife Jeffrey [name].  The home inspection checklist has been reviewed and no unsafe conditions have been identified. Family reports that there are at least two dedicated grounded, 3-prong outlets with clearly labeled circuit breaker has been established in the bedroom for power module and Char[employer] fundraiser.   Both patient and caregivers have been trained on the following:  1. HM III LVAD overview of system operations  2. Overview of major lifestyle accommodations and cautions   3. Overview of system components (features and functions) 4. Changing power sources 5. Overview of alerts and alarms 6. How to identify and manage an emergency including when pump is running and when pump has stopped  7. Changing system controller 8. Maintain emergency contact list and medications  The patient and caregiver(s) have successfully demonstrated:  1. Changing power source (from batteries to mobile power unit, mobile power unit to batteries, and replacing batteries) 2. Perform system controller self test  3. Check and charge batteries  4. Change system controller 5. Paged VAD pager and programmed number in phones  A daily flow sheet with patient  weight, temperature,  flow, speed, power, and PI, along with daily self checks on system controller and power module have been performed by patient and caregiver(s) during hospitalization and will also be done daily at home.   The caregiver(s) has been trained on percutaneous lead exit site care, care of the driveline and dressing changes. They have performed dressing changes during patient's hospitalization under my supervision with the support of the nursing staff. The importance of lead immobilization has been stressed to patient and caregiver(s) using the attachment device. The caregiver has successfully demonstrated the following: 1. Cleansing site with sterile technique 2.  Dressing care and maintenance  3. Immobilizing driveline  The following routine activities and maintenance have been reviewed with patient and caregiver(s) and both verbalize understanding:  1. Stressed importance of never disconnecting power from both controller power leads at the same time, and never disconnecting both batteries at the same time, or the pump will stop 2. Plug the mobile power unit (MPU) and the universal battery charger (UBC) into properly grounded (3 prong) outlets dedicated to PM use. Do NOT use adapter (cheater plug) for ungrounded outlets or multiple portable socket outlets (power strips) 3. Do not connect the PM or MPU to an outlet controlled by wall switch or the device may not work 4. Transfer from MPU to batteries during [facility] mains power failure. The PM has internal backup battery that will power the pump while you transfer to batteries 5. Keep a backup system controller, charged batteries, battery clips, and flashlight near you during sleep in case of electrical power outage 6. Clean battery, battery clip, and universal battery charger contacts weekly 7. Visually inspect percutaneous lead daily 8. Check cables and connectors when changing power source  9. Rotate batteries; keep all eight batteries charged 10. Always have backup system controller, battery clips, fully charged batteries, and spare fully charged batteries when traveling 11. Re-calibrate batteries every 70 uses; monitor battery life of 36 months or 360 uses; replace batteries at end of battery life   Identified the following changes in activities of daily living with pump:  1. No driving for at least six weeks and then only if doctor gives permission to do so 2. No tub baths while pump implanted, and shower only if doctor gives permission 3. No swimming or submersion in water  while implanted with pump 4. Keep all VAD equipment away from water or moisture 5. Keep all VAD connections clean and dry 6. No  contact sports or engage in jumping activities 7. Avoid strong static electricity (touching TV/computer screens, vacuuming) 8. Never have an MRI while implanted with the pump 9. Never leave or store batteries in extremely hot or cold places (such as   trunk of your car), or the battery life will be shortened 10. Call the doctor or hospital contact person if any change in how the pump sounds, feels, or works 11. Plan to sleep only when connected to the power module. 12. Keep a backup system controller, charged batteries, battery clips, and flashlight near you during sleep in case of electrical power outage 13. Do not sleep on your stomach 14. Talk with doctor before any long distance travel plans 15. Patient will need antibiotics prior to any dental procedure; instructed to contact VAD coordinator before any dental procedures (including routine cleaning)   Discharge binder given to patient and include the following: 1. List of emergency contacts 2. Wallet card 3. HM III Luggage tags 4. HM III Alarms for Patients and their Caregivers 5. HM III Patient Handbook 6. HM III Patient Education Program DVD 7. Daily diary sheets 8. Warfarin teaching sheets 9. Nosebleed teaching sheets 10. Medications you may and may not take with CHF list  Discharge equipment includes:  1. Two system controllers 2. One mobile power unit with attached 21 ' patient cable 3. One universal Char[employer] fundraiser (UBC) 4. Eight fully charged batteries  5. Four battery clips 6. One travel case 7. One holster vest 8. Wearable accessory package 9. Daily dressing kits and anchors   Discussed frequency and importance of INR checks; emphasized importance of maintaining INR goal to prevent clotting and or bleeding issues with pump. Able to answer questions and asked good questions pertaining to warfarin and diet/lifestyle changes necessary to be successful and safe. current INR goal is 2.0 - 2.5.    The patient has  completed a proficiency test for the HM III and all questions have been answered. The pt and family have been instructed to call if any questions, problems, or concerns arise. Pt and caregiver successfully paged VAD coordinator using VAD pager emergency number and have been instructed to use this number only for emergencies.  Patient and caregiver(s) asked appropriate questions, had good interaction with VAD coordinator, and verbalized understanding of above instructions.   Total elapsed time:  4 hrs  Tanda Rockers, RN VAD Coordinator  Office: [phone] 24/7 VAD Pager: [phone]

## 2021-08-04 NOTE — Progress Notes (Signed)
LVAD Coordinator Rounding Note:   Admitted 07/18/21 due to Dr. [name] for elective VAD placement.    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 Cyndia Bent under Destination Therapy criteria due to BMI and chronic kidney disease.    Pt sitting up in recliner. Still having issues with severe gout pain mainly the R knee. Ice on right knee. He reports he is still unable to ambulate. Encouraged patient to push through pain and participate with PT; says he "just can't".   [name] in the room to perform dressing change. We completed discharge education today - see separate note for d/c teaching.  Vital signs: Temp:  98.4 HR: 80 NSR Doppler:  85 Auto cuff:  113/94 (102) O2 Sat: 96% on 2L/[place] Wt:  277>292>285>296.3>305.5>305.5>302.9>299.8>297.6>294.1>295.6>293.2 lbs   LVAD interrogation reveals:  Speed: 6200 Flow: 4.8 Power:  5.1 w PI: 2.8 Hct: 28   Alarms:  none Events: none   Fixed speed:  6200 Low speed limit: 5900   Drive Line: pts wife [name] performed dressing change with VAD coordinator observation. Existing VAD dressing removed and site care performed using sterile technique. Drive line exit site cleaned with Chlora prep applicators x 2, allowed to dry, and gauze dressing with silver strip applied. Exit site with one suture intact, the velour is fully implanted at exit site; moderate amount dried fat necrosis drainage on silver strip with no redness, tenderness, or foul odor noted. Drive line anchor intact x 2. Continue MWF dressing changes per VAD Coordinator, Nurse Davonna Belling, or trained caregiver. Next dressing change due 08/06/21.       Labs:  LDH trend: 318>372>312>302>243>225>211>214>186>176   INR trend: 1.3>1.9>1.9>2.0>2.3>1.9>1.7>1.5>1.3>2.2    Anticoagulation Plan: -INR Goal: 2.0 - 2.5  -ASA Dose: none due to allergy     Blood Products:  - 07/21/21 2 units FFP (intra op)   Device: - [employer] single ICD -Therapies: off    Respiratory: Extubated  07/22/21   Nitric Oxide:  Off 07/22/21   Infection:  - sputum culture 07/23/21>>> specimen rejected - urine culture 07/23/21>>>no growth (final) - blood culture 07/23/21>>>no growth final   VAD Education: Discharge teaching completed today - please see separate discharge note for details.    Plan/Recommendations:  Call VAD Coordinator if any VAD equipment or drive line issues. Continue Mon/Wed/Fri dressing changes per VAD Coordinator, Nurse Davonna Belling, or wife [place].   Tanda Rockers RN [place] Coordinator  Office: [phone]  24/7 Pager: [phone]

## 2021-08-04 NOTE — Plan of Care (Signed)
  Problem: Education: Goal: Knowledge of General Education information will improve Description: Including pain rating scale, medication(s)/side effects and non-pharmacologic comfort measures Outcome: Progressing   Problem: Health Behavior/Discharge Planning: Goal: Ability to manage health-related needs will improve Outcome: Progressing   Problem: Clinical Measurements: Goal: Ability to maintain clinical measurements within normal limits will improve Outcome: Progressing Goal: Will remain free from infection Outcome: Progressing Goal: Diagnostic test results will improve Outcome: Progressing Goal: Respiratory complications will improve Outcome: Progressing Goal: Cardiovascular complication will be avoided Outcome: Progressing   Problem: Activity: Goal: Risk for activity intolerance will decrease Outcome: Progressing   Problem: Nutrition: Goal: Adequate nutrition will be maintained Outcome: Progressing   Problem: Coping: Goal: Level of anxiety will decrease Outcome: Progressing   Problem: Elimination: Goal: Will not experience complications related to bowel motility Outcome: Progressing Goal: Will not experience complications related to urinary retention Outcome: Progressing   Problem: Pain Managment: Goal: General experience of comfort will improve Outcome: Progressing   Problem: Safety: Goal: Ability to remain free from injury will improve Outcome: Progressing   Problem: Skin Integrity: Goal: Risk for impaired skin integrity will decrease Outcome: Progressing   Problem: Education: Goal: Knowledge of the prescribed therapeutic regimen will improve Outcome: Progressing   Problem: Activity: Goal: Risk for activity intolerance will decrease Outcome: Progressing   Problem: Cardiac: Goal: Ability to maintain an adequate cardiac output will improve Outcome: Progressing   Problem: Coping: Goal: Level of anxiety will decrease Outcome: Progressing   Problem:  Fluid Volume: Goal: Risk for excess fluid volume will decrease Outcome: Progressing   Problem: Clinical Measurements: Goal: Ability to maintain clinical measurements within normal limits will improve Outcome: Progressing Goal: Will remain free from infection Outcome: Progressing   Problem: Respiratory: Goal: Will regain and/or maintain adequate ventilation Outcome: Progressing

## 2021-08-04 NOTE — Progress Notes (Signed)
Physical Therapy Treatment Patient Details Name: Jeffrey [name] MRN: [id number] DOB: [birth year] Today's Date: 08/04/2021   History of Present Illness 45 y.o. male with class IV HF admitted 07/18/21 for pre-VAD optimization; has been relatively stable as outpatient with NYHA 3B symptoms. S/p RHC with swan placement 10/14. S/p HM3 LVAD implant on 10/17. Hypoxia overnight 10/19 requiring bipap. 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 Pt underwent cardioversion 10/23. PMH includes poorly controlled HTN, renal cell carcinoma s/p nephrectomy, CKD3, DM2, OSA, gout, morbid obesity, HF (EF<20%).    PT Comments    Pt with improving knee pain and able to amb in hallway. Expect continued progress as pain continues to improve.  Will continue to encourage increased activity.  Recommendations for follow up therapy are one component of a multi-disciplinary discharge planning process, led by the attending physician.  Recommendations may be updated based on patient status, additional functional criteria and insurance authorization.  Follow Up Recommendations  Home health PT     Assistance Recommended at Discharge Intermittent Supervision/Assistance  Equipment Recommendations  Other (comment) (possibly rollator if he doesn't have already)    Recommendations for Other Services       Precautions / Restrictions Precautions Precautions: Fall;Sternal;Other (comment) Precaution Comments: LVAD     Mobility  Bed Mobility Overal bed mobility: Modified Independent Bed Mobility: Supine to Sit     Supine to sit: Modified independent (Device/Increase time);HOB elevated          Transfers Overall transfer level: Needs assistance Equipment used: Rolling walker (2 wheels) Transfers: Sit to/from Stand Sit to Stand: Min guard                Ambulation/Gait Ambulation/Gait assistance: Min guard Gait Distance (Feet): 150 Feet Assistive device: Rolling walker (2 wheels) Gait  Pattern/deviations: Step-through pattern;Wide base of support;Antalgic Gait velocity: decr Gait velocity interpretation: 1.31 - 2.62 ft/sec, indicative of limited community ambulator General Gait Details: Assist for safety and lines. No loss of balance   Stairs             Wheelchair Mobility    Modified Rankin (Stroke Patients Only)       Balance Overall balance assessment: Needs assistance Sitting-balance support: No upper extremity supported;Feet supported Sitting balance-Leahy Scale: Good     Standing balance support: No upper extremity supported Standing balance-Leahy Scale: Fair                              Cognition Arousal/Alertness: Awake/alert Behavior During Therapy: WFL for tasks assessed/performed Overall Cognitive Status: Within Functional Limits for tasks assessed                                          Exercises      General Comments General comments (skin integrity, edema, etc.): VSS on RA      Pertinent Vitals/Pain Pain Assessment: Faces Faces Pain Scale: Hurts even more Pain Location: bilateral knees (R>L) Pain Descriptors / Indicators: Grimacing;Guarding Pain Intervention(s): Limited activity within patient's tolerance    Home Living                          Prior Function            PT Goals (current goals can now be found in the care plan section) Acute Rehab PT Goals  Patient Stated Goal: return home Progress towards PT goals: Progressing toward goals    Frequency    Min 3X/week      PT Plan Current plan remains appropriate    Co-evaluation              AM-PAC PT "6 Clicks" Mobility   Outcome Measure  Help needed turning from your back to your side while in a flat bed without using bedrails?: None Help needed moving from lying on your back to sitting on the side of a flat bed without using bedrails?: None Help needed moving to and from a bed to a chair (including a  wheelchair)?: A Little Help needed standing up from a chair using your arms (e.g., wheelchair or bedside chair)?: A Little Help needed to walk in hospital room?: A Little Help needed climbing 3-5 steps with a railing? : A Little 6 Click Score: 20    End of Session   Activity Tolerance: Patient tolerated treatment well Patient left: in chair;with call Fabry/phone within reach Nurse Communication: Mobility status PT Visit Diagnosis: Other abnormalities of gait and mobility (R26.89);Difficulty in walking, not elsewhere classified (R26.2);Pain Pain - Right/Left: Right Pain - part of body: Knee     Time: 1515-1540 PT Time Calculation (min) (ACUTE ONLY): 25 min  Charges:  $Gait Training: 23-37 mins                     [place] Pager [phone] Office [place] 08/04/2021, 4:56 PM

## 2021-08-04 NOTE — Discharge Instructions (Signed)
Information on my medicine - Coumadin   (Warfarin)  This medication education was reviewed with me or my healthcare representative as part of my discharge preparation.  T Why was Coumadin prescribed for you? Coumadin was prescribed for you because you have a blood clot or a medical condition that can cause an increased risk of forming blood clots. Blood clots can cause serious health problems by blocking the flow of blood to the heart, lung, or brain. Coumadin can prevent harmful blood clots from forming. As a reminder your indication for Coumadin is:  Blood Clot Prevention after Heart Pump Surgery  What test will check on my response to Coumadin? While on Coumadin (warfarin) you will need to have an INR test regularly to ensure that your dose is keeping you in the desired range. The INR (international normalized ratio) number is calculated from the result of the laboratory test called prothrombin time (PT).  If an INR APPOINTMENT HAS NOT ALREADY BEEN MADE FOR YOU please schedule an appointment to have this lab work done by your health care provider within 7 days. Your INR goal is a number between:  2 to 2.5.  What  do you need to  know  About  COUMADIN? Take Coumadin (warfarin) exactly as prescribed by your healthcare provider about the same time each day.  DO NOT stop taking without talking to the doctor who prescribed the medication.  Stopping without other blood clot prevention medication to take the place of Coumadin may increase your risk of developing a new clot or stroke.  Get refills before you run out.  What do you do if you miss a dose? If you miss a dose, take it as soon as you remember on the same day then continue your regularly scheduled regimen the next day.  Do not take two doses of Coumadin at the same time.  Important Safety Information A possible side effect of Coumadin (Warfarin) is an increased risk of bleeding. You should call your healthcare provider right away if you  experience any of the following: Bleeding from an injury or your nose that does not stop. Unusual colored urine (red or dark brown) or unusual colored stools (red or black). Unusual bruising for unknown reasons. A serious fall or if you hit your head (even if there is no bleeding).  Some foods or medicines interact with Coumadin (warfarin) and might alter your response to warfarin. To help avoid this: Eat a balanced diet, maintaining a consistent amount of Vitamin K. Notify your provider about major diet changes you plan to make. Avoid alcohol or limit your intake to 1 drink for women and 2 drinks for men per day. (1 drink is 5 oz. wine, 12 oz. beer, or 1.5 oz. liquor.)  Make sure that ANY health care provider who prescribes medication for you knows that you are taking Coumadin (warfarin).  Also make sure the healthcare provider who is monitoring your Coumadin knows when you have started a new medication including herbals and non-prescription products.  Coumadin (Warfarin)  Major Drug Interactions  Increased Warfarin Effect Decreased Warfarin Effect  Alcohol (large quantities) Antibiotics (esp. Septra/Bactrim, Flagyl, Cipro) Amiodarone (Cordarone) Aspirin (ASA) Cimetidine (Tagamet) Megestrol (Megace) NSAIDs (ibuprofen, naproxen, etc.) Piroxicam (Feldene) Propafenone (Rythmol SR) Propranolol (Inderal) Isoniazid (INH) Posaconazole (Noxafil) Barbiturates (Phenobarbital) Carbamazepine (Tegretol) Chlordiazepoxide (Librium) Cholestyramine (Questran) Griseofulvin Oral Contraceptives Rifampin Sucralfate (Carafate) Vitamin K   Coumadin (Warfarin) Major Herbal Interactions  Increased Warfarin Effect Decreased Warfarin Effect  Garlic Ginseng Ginkgo biloba Coenzyme Q10 Green  tea St. John's wort    Coumadin (Warfarin) FOOD Interactions  Eat a consistent number of servings per week of foods HIGH in Vitamin K (1 serving =  cup)  Collards (cooked, or boiled & drained) Kale  (cooked, or boiled & drained) Mustard greens (cooked, or boiled & drained) Parsley *serving size only =  cup Spinach (cooked, or boiled & drained) Swiss chard (cooked, or boiled & drained) Turnip greens (cooked, or boiled & drained)  Eat a consistent number of servings per week of foods MEDIUM-HIGH in Vitamin K (1 serving = 1 cup)  Asparagus (cooked, or boiled & drained) Broccoli (cooked, boiled & drained, or raw & chopped) Brussel sprouts (cooked, or boiled & drained) *serving size only =  cup Lettuce, raw (green leaf, endive, romaine) Spinach, raw Turnip greens, raw & chopped   These websites have more information on Coumadin (warfarin):  www.coumadin.com; www.ahrq.gov/consumer/coumadin.htm;   

## 2021-08-04 NOTE — Progress Notes (Signed)
[place] for warfarin Indication:  LVAD  Allergies  Allergen Reactions   Aspirin Shortness Of Breath, Itching and Rash     Burning sensation (Patient reports he tolerates other NSAIDS)    Bee Venom Hives and Swelling   Lisinopril Cough   Shrimp (Diagnostic) Rash   Tomato Rash    Patient Measurements: Height: '5\' 7"'  (170.2 cm) Weight: 133 kg (293 lb 3.4 oz) IBW/kg (Calculated) : 66.1  Vital Signs: Temp: 98.4 F (36.9 C) (10/31 0724) Temp Source: Oral (10/31 0724) BP: 113/94 (10/31 0724) Pulse Rate: 73 (10/31 0724)  Labs: Recent Labs    08/01/21 2041 08/02/21 0552 08/02/21 0552 08/03/21 0152 08/04/21 0503  HGB  --  9.3*   < > 9.0* 9.3*  HCT  --  28.1*  --  27.8* 28.8*  PLT  --  385  --  354 356  LABPROT  --  18.6*  --  21.6* 24.8*  INR  --  1.6*  --  1.9* 2.2*  HEPARINUNFRC <0.10* <0.10*  --  <0.10*  --   CREATININE  --  1.67*  --  1.63* 1.53*   < > = values in this interval not displayed.     Estimated Creatinine Clearance: 79.3 mL/min (A) (by C-G formula based on SCr of 1.53 mg/dL (H)).   Medical History: Past Medical History:  Diagnosis Date   Anxiety    Aspirin allergy    Childhood asthma    Chronic systolic CHF (congestive heart failure) ([place])    a. EF 20-25% in 2012. b. EF 45-50% in 10/2011 with nonischemic nuc - presumed NICM. c. 12/2014 Echo: Sev depressed LV fxn, sev dil LV, mild LVH, mild MR, sev dil LA, mildly reduced RV fxn.   CKD (chronic kidney disease) stage 2, GFR 60-89 ml/min    H/O vasectomy 12/2019   High cholesterol    Hypertension    Morbid obesity ([place])    Nephrolithiasis    OSA on CPAP    Paroxysmal atrial fibrillation ([place])    Presumed NICM    a. 04/2014 Myoview: EF 26%, glob HK, sev glob HK, ? prior infarct;  b. Never cathed 2/2 CKD.   Renal cell carcinoma ([place])    a. s/p Rt robotic assisted partial converted to radical nephrectomy on 01/2013.   Troponin level elevated    a. 04/2014, 12/2014:  felt due to CHF.   Type II diabetes mellitus ([facility])    Ventricular tachycardia    a. appropriate ICD therapy 12/2017    Medications:  Medications Prior to Admission  Medication Sig Dispense Refill Last Dose   acetaminophen (TYLENOL) 325 MG tablet Take 2 tablets (650 mg total) by mouth every 6 (six) hours as needed for fever.   Past Week   allopurinol (ZYLOPRIM) 100 MG tablet Take 1 tablet (100 mg total) by mouth daily. 90 tablet 3 07/18/2021   ALPRAZolam (XANAX) 0.5 MG tablet Take 1 tablet (0.5 mg total) by mouth 2 (two) times daily. 60 tablet 4 Past Week   amiodarone (PACERONE) 200 MG tablet Take 0.5 tablets (100 mg total) by mouth daily. 30 tablet 3 07/17/2021   apixaban (ELIQUIS) 5 MG TABS tablet Take 1 tablet (5 mg total) by mouth 2 (two) times daily. 60 tablet 3 07/14/2021   diclofenac Sodium (VOLTAREN) 1 % GEL Apply 2 g topically 4 (four) times daily. 100 g 0 Past Week   docusate sodium (COLACE) 100 MG capsule Take 1 capsule (100 mg total)  by mouth 2 (two) times daily. 10 capsule 0 Past Week   empagliflozin (JARDIANCE) 10 MG TABS tablet TAKE 1 TABLET (10 MG TOTAL) BY MOUTH DAILY BEFORE BREAKFAST. 90 tablet 3 07/18/2021   insulin aspart protamine - aspart (NOVOLOG 70/30 MIX) (70-30) 100 UNIT/ML FlexPen Inject 38 Units into the skin 2 (two) times daily. 15 mL 11 07/17/2021   Insulin Syringe-Needle U-100 (TRUEPLUS INSULIN SYRINGE) 31G X 5/16" 0.5 ML MISC USE TO INJECT INSULIN TWICE DAILY. 100 each 11 07/17/2021   losartan (COZAAR) 25 MG tablet Take 0.5 tablets (12.5 mg total) by mouth daily. 45 tablet 3 07/18/2021   metFORMIN (GLUCOPHAGE) 1000 MG tablet Take 1 tablet (1,000 mg total) by mouth 2 (two) times daily with a meal. 60 tablet 11 07/18/2021   pantoprazole (PROTONIX) 40 MG tablet Take 1 tablet (40 mg total) by mouth daily. 30 tablet 0 Past Week   polyethylene glycol (MIRALAX / GLYCOLAX) 17 g packet Take 17 g by mouth daily. 14 each 0 Past Week   sertraline (ZOLOFT) 25 MG tablet Take 1  tablet (25 mg total) by mouth daily. 30 tablet 3 07/18/2021   spironolactone (ALDACTONE) 25 MG tablet Take 1 tablet (25 mg total) by mouth daily. 30 tablet 6 07/18/2021   torsemide (DEMADEX) 20 MG tablet Take 1 tablet (20 mg total) by mouth daily as needed (If wt gain > 3 lb in 24 hr or > 5 lb in 1 week). 30 tablet 2 Past Week   traMADol (ULTRAM) 50 MG tablet Take 2 tablets (100 mg total) by mouth every 12 (twelve) hours as needed for moderate pain. 30 tablet 0 Past Week   traZODone (DESYREL) 100 MG tablet Take 1 tablet (100 mg total) by mouth at bedtime. 30 tablet 0 07/17/2021   nitroGLYCERIN (NITROSTAT) 0.4 MG SL tablet Place 1 tablet (0.4 mg total) under the tongue every 5 (five) minutes x 3 doses as needed for chest pain. 25 tablet 12 Unknown   Scheduled:   allopurinol  100 mg Oral Daily   amiodarone  200 mg Oral Daily   bupivacaine  20 mL Infiltration Once   chlorhexidine gluconate (MEDLINE KIT)  15 mL Mouth Rinse BID   Chlorhexidine Gluconate Cloth  6 each Topical Daily   colchicine  0.6 mg Oral Daily   docusate sodium  200 mg Oral Daily   gabapentin  300 mg Oral Daily   insulin aspart  0-20 Units Subcutaneous TID WC   insulin aspart  0-5 Units Subcutaneous QHS   insulin aspart  4 Units Subcutaneous TID WC   insulin detemir  32 Units Subcutaneous BID   isosorbide-hydrALAZINE  2 tablet Oral TID   mouth rinse  15 mL Mouth Rinse BID   methylPREDNISolone acetate  80 mg Intra-articular Once   pantoprazole  40 mg Oral Daily   polyethylene glycol  17 g Oral BID   predniSONE  40 mg Oral Q breakfast   sacubitril-valsartan  1 tablet Oral BID   senna  1 tablet Oral BID   sertraline  25 mg Oral Daily   sodium chloride flush  10-40 mL Intracatheter Q12H   sodium chloride flush  3 mL Intravenous Q12H   spironolactone  25 mg Oral Daily   traZODone  100 mg Oral QHS   warfarin  4 mg Oral q1600   Warfarin - Pharmacist Dosing Inpatient   Does not apply q1600   Infusions:   sodium chloride  Stopped (07/29/21 1211)    Assessment: Patient is  status post LVAD placement on 10/17. Warfarin was initiated on 10/18.   No bleeding reported. CBC stable. S/p DCCV 10/23 begin heparin at 500 units/hr fixed dose with no titrations while awaiting therapeutic INR - stopped 10/30 INR 2.2 at goal after 2 boost doses over the weekend.   No s/sx of bleeding or infusion issues.   Goal of Therapy:  INR 2-2.5 Heparin level <0.3 Monitor platelets by anticoagulation protocol: Yes   Plan:  Warfarin 4 mg daily  Monitor daily Protime  CBC, and for s/sx of bleed   Bonnita Nasuti Pharm.D. CPP, BCPS Clinical Pharmacist [phone] 08/04/2021 1:48 PM

## 2021-08-04 NOTE — Progress Notes (Signed)
CSW met at bedside with patient. He reports continued issues with his gout and unable to ambulate long distances. He shared that he was provided some injection today and hopeful for added ambulation tomorrow. Patient reports his wife was in earlier today for teaching and she did great! Patient continues in his recovery. CSW continues to follow. Raquel Sarna, [place], [place]

## 2021-08-04 NOTE — Progress Notes (Addendum)
Patient ID: Jeffrey [name], male   DOB: 09/10/1976, 45 y.o.   MRN: [id number]   Advanced Heart Failure VAD Team Note  PCP-Cardiologist: Glori Bickers, MD   Subjective:    10/17: HM3 LVAD implant (DT)  10/18: Extubated 10/23: DC-CV for A fib RVR--> NSR. Milrinone cut back to 0.125 mcg.   10/25: Bidil increased to 2 tabs tid.  10/26: Lasix and milrinone stopped. Had ramp echo with speed increased to 6100 10/27: VAD speed increased to 6200  10/28: Right knee aspirated, steroid injection.  10/30: Head CT for dizziness was negative   Continues w/ severe Rt knee pain. Was improving post steroid injection but worsened overnight, he believes he may have twisted his knee in his sleep. Joint fluid aspirate NGTD.  WBC down 17>>16>>14. AF Remains on Prednisone 40 daily. CBGs elevated, 2-300s.   Co-ox 69%. INR 2.2   Creatinine stable 1.53   MAPs elevated 90s-100s.   Dizziness resolved w/ meclizine. He reports felt like previous vertigo attacks.   LVAD INTERROGATION:  HeartMate III LVAD:   Flow 4.8 liters /min, speed 6200 power 5.0 PI 2.7  No PI events.   Objective:    Vital Signs:   Temp:  [98.1 F (36.7 C)-98.7 F (37.1 C)] 98.4 F (36.9 C) (10/31 0724) Pulse Rate:  [73-120] 73 (10/31 0724) Resp:  [16-20] 20 (10/31 0724) BP: (91-113)/(72-94) 113/94 (10/31 0724) SpO2:  [95 %-98 %] 96 % (10/31 0724) Last BM Date: 08/03/21 Mean arterial Pressure 90s-100s    Intake/Output:   Intake/Output Summary (Last 24 hours) at 08/04/2021 0727 Last data filed at 08/04/2021 0500 Gross per 24 hour  Intake 548 ml  Output 800 ml  Net -252 ml     Physical Exam   General: Well appearing this am. Laying in bed. No distress.  HEENT: Normal. Neck: Supple, No JVD . Carotids OK.  Cardiac:  Mechanical heart sounds with LVAD hum present.  Lungs:  clear bilaterally, no wheezing  Abdomen:  soft, nontender, nondistended, no HSM. No bruits or masses. +BS  LVAD exit site: Well-healed and  incorporated. Dressing dry and intact. No erythema or drainage. Stabilization device present and accurately applied. Driveline dressing changed daily per sterile technique. Extremities:  Warm and dry. No cyanosis, clubbing, rash.  Right knee swollen warm and tender. No LEE  Neuro:  Alert & oriented x 3. Cranial nerves grossly intact. Moves all 4 extremities w/o difficulty. Affect pleasant     Telemetry   NSR 70s personally reviewed.   Labs   Basic Metabolic Panel: Recent Labs  Lab 07/31/21 0339 08/01/21 0540 08/02/21 0552 08/03/21 0152 08/04/21 0503  NA 133* 135 135 137 135  K 3.5 3.9 5.1 4.9 4.1  CL 94* 99 102 105 102  CO2 _0 GLUCOSE 264* 144* 212* 206* 270*  BUN 65* 54* 54* 49* 36*  CREATININE 1.89* 1.57* 1.67* 1.63* 1.53*  CALCIUM 10.0 10.4* 10.6* 10.8* 10.4*  MG 2.0 2.2 2.2 2.1 1.9    Liver Function Tests: No results for input(s): AST, ALT, ALKPHOS, BILITOT, PROT, ALBUMIN in the last 168 hours.  No results for input(s): LIPASE, AMYLASE in the last 168 hours. No results for input(s): AMMONIA in the last 168 hours.  CBC: Recent Labs  Lab 07/31/21 0339 08/01/21 0540 08/02/21 0552 08/03/21 0152 08/04/21 0503  WBC 12.1* 15.9* 17.6* 16.0* 14.4*  HGB 10.2* 9.3* 9.3* 9.0* 9.3*  HCT 30.7* 29.1* 28.1* 27.8* 28.8*  MCV 72.4* 73.5* 73.4* 74.1*  73.8*  PLT 419* 468* 385 354 356    INR: Recent Labs  Lab 07/31/21 0339 08/01/21 0540 08/02/21 0552 08/03/21 0152 08/04/21 0503  INR 1.5* 1.3* 1.6* 1.9* 2.2*    Other results:   Imaging   CT HEAD WO CONTRAST (5MM)  Result Date: 08/03/2021 CLINICAL DATA:  Nonspecific dizziness EXAM: CT HEAD WITHOUT CONTRAST TECHNIQUE: Contiguous axial images were obtained from the base of the skull through the vertex without intravenous contrast. COMPARISON:  02/11/2016 FINDINGS: Brain: No evidence of acute infarction, hemorrhage, hydrocephalus, extra-axial collection or mass lesion/mass effect. Vascular: No hyperdense  vessel or unexpected calcification. Skull: Normal. Negative for fracture or focal lesion. Sinuses/Orbits: No acute finding. IMPRESSION: Negative head CT. Electronically Signed   By: Jorje Guild M.D.   On: 08/03/2021 05:17     Medications:     Scheduled Medications:  allopurinol  100 mg Oral Daily   amiodarone  200 mg Oral Daily   amLODipine  5 mg Oral Daily   bupivacaine  20 mL Infiltration Once   chlorhexidine gluconate (MEDLINE KIT)  15 mL Mouth Rinse BID   Chlorhexidine Gluconate Cloth  6 each Topical Daily   colchicine  0.6 mg Oral Daily   docusate sodium  200 mg Oral Daily   gabapentin  300 mg Oral Daily   heparin injection (subcutaneous)  5,000 Units Subcutaneous Q8H   insulin aspart  0-20 Units Subcutaneous TID WC   insulin aspart  0-5 Units Subcutaneous QHS   insulin detemir  30 Units Subcutaneous BID   isosorbide-hydrALAZINE  2 tablet Oral TID   mouth rinse  15 mL Mouth Rinse BID   methylPREDNISolone acetate  80 mg Intra-articular Once   pantoprazole  40 mg Oral Daily   polyethylene glycol  17 g Oral BID   predniSONE  40 mg Oral Q breakfast   senna  1 tablet Oral BID   sertraline  25 mg Oral Daily   sodium chloride flush  10-40 mL Intracatheter Q12H   sodium chloride flush  3 mL Intravenous Q12H   spironolactone  25 mg Oral Daily   traZODone  100 mg Oral QHS   warfarin  4 mg Oral q1600   Warfarin - Pharmacist Dosing Inpatient   Does not apply q1600    Infusions:  sodium chloride Stopped (07/29/21 1211)    PRN Medications: sodium chloride, acetaminophen, meclizine, ondansetron (ZOFRAN) IV, oxyCODONE, sodium chloride flush, sodium chloride flush, traMADol   Assessment/Plan:    1. Acute on Chronic systolic CHF - Nonischemic CM, end-stage - Echo (9/21): EF 20-25%.   - Echo  8/22: EF < 20%, severe LV dilation, moderate RV dysfunction.   - RHC (8/22): well compensated hemodynamics. - s/p S-ICD - Not transplant candidate with BMI - S/p HM3 LVAD 10/17 (DT) -  CO-OX 69% off milrinone.  - Volume looks ok, no Lasix today.  - MAP 90s-100s today. On Bidil 2 tabs tid, spironolactone 25 mg daily and amlodipine 5 mg daily.   - Renal fx improved and back to baseline, add losartan 25 mg daily   2. VAD  - s/p HM-3 placement 10/17 - LDH 186 => 232 => 236=>176.  - Off  diuretics.  - VAD interrogated personally. Parameters stable. - 10/26 Ramp echo with speed increased. Now on 6200  - INR 2.2 on warfarin.   - Continue neurontin for pocket pain  3 Atrial fibrillation: Paroxysmal - Had severe AF with RVR 7/22 s/p DC-CV x 2 - Seen by EP. Not  a candidate for ablation due to body habitus  (could consider AVN ablation and CRT, if needed)  - Back in AF with RVR post VAD. Failed RAP x 3.  - 10/23 S/P DC-CV with restoration of NSR. - Now on po amio 200 mg daily.     - INR 2.2   4. CKD stage 3B  - Has solitary kidney.   - Baseline creatinine is 1.6-2.9.  - Creatinine 1.53 today.   5. OSA - Continue CPAP   6. DM2. Poorly controlled - On Jardiance. - Hgb A1c 9.4. - CBGs elevated w/ prednisone - Increase SSI, d/w pharmD     7. Hematuria - MRI and cystoscopy ok   8. Polyarticular gout. - severe, still with significant pain and has been unable to stand.   - Continue prednisone daily.    - Continue colchicine daily.   - Consulted ortho => knee aspirated and steroid injected. Mobility still significantly limited by gout pain.  - Will ask ortho to revisit today   9. Headache/vertigo - CT head negative.  - Has history of vertigo with prior meclizine use.  - Now improved, continue meclizine prn   I reviewed the LVAD parameters from today, and compared the results to the patient's prior recorded data.  No programming changes were made.  The LVAD is functioning within specified parameters.  The patient performs LVAD self-test daily.  LVAD interrogation was negative for any significant power changes, alarms or PI events/speed drops.  LVAD equipment check  completed and is in good working order.  Back-up equipment present.   LVAD education done on emergency procedures and precautions and reviewed exit site care.  Length of Stay: [address], PA-C 08/04/2021, 7:27 AM  VAD Team --- VAD ISSUES ONLY--- Pager [phone] (7am - 7am)  Advanced Heart Failure Team  Pager [phone] (M-F; 7a - 5p)   Patient seen and examined with the above-signed Advanced Practice Provider and/or Housestaff. I personally reviewed laboratory data, imaging studies and relevant notes. I independently examined the patient and formulated the important aspects of the plan. I have edited the note to reflect any of my changes or salient points. I have personally discussed the plan with the patient and/or family.  Vertigo improved but still complaining of severe R knee pain despite ongoing steroids, colchicine and joint injection. Volume status ok. MAPS elevated. INR 2.2    VAD interrogated personally. Parameters stable.  General:  NAD.  HEENT: normal  Neck: supple. JVP not elevated.  Carotids 2+ bilat; no bruits. No lymphadenopathy or thryomegaly appreciated. Cor: LVAD hum.  Lungs: Clear. Abdomen: obese soft, nontender, non-distended. No hepatosplenomegaly. No bruits or masses. Good bowel sounds. Driveline site clean. Anchor in place.  Extremities: no cyanosis, clubbing, rash. Warm no edema R knee with effusion and painful ROM Neuro: alert & oriented x 3. No focal deficits. Moves all 4 without problem   Volume status ok. VAD parameters stable. Remains in NSR. INR 2.2. MAPs elevated. Stop amlodipine. Add Entresto 24/26. D/w PharmD.   Main barrier remains persistent R knee pain. Will revisit with ortho today.   Glori Bickers, MD  8:22 AM

## 2021-08-04 NOTE — Progress Notes (Signed)
TCTS Subjective:  Feels better today. Right knee pain better and was able to ambulate in hall with PT.  Co-ox 69%. VAD parameters stable   Weight gradually coming down.   Objective: Vital signs in last 24 hours: Temp:  [98.3 F (36.8 C)-98.7 F (37.1 C)] 98.3 F (36.8 C) (10/31 1622) Pulse Rate:  [73-120] 77 (10/31 1619) Cardiac Rhythm: Heart block (10/31 1605) Resp:  [16-25] 18 (10/31 1622) BP: (91-135)/(72-106) 135/106 (10/31 1619) SpO2:  [95 %-98 %] 95 % (10/31 1619)  Hemodynamic parameters for last 24 hours:    Intake/Output from previous day: 10/30 0701 - 10/31 0700 In: 548 [P.O.:540; I.V.:8] Out: 800 [Urine:800] Intake/Output this shift: Total I/O In: -  Out: 950 [Urine:950]  General appearance: alert and cooperative Heart: regular rate and rhythm Lungs: clear to auscultation bilaterally Extremities: right knee swelling stable. Mild edema in ankles. Wound: chest incision healing well. Wife doing drive line dressings and becoming more comfortable with that.   Lab Results: Recent Labs    08/03/21 0152 08/04/21 0503  WBC 16.0* 14.4*  HGB 9.0* 9.3*  HCT 27.8* 28.8*  PLT 354 356   BMET:  Recent Labs    08/03/21 0152 08/04/21 0503  NA 137 135  K 4.9 4.1  CL 105 102  CO2 23 27  GLUCOSE 206* 270*  BUN 49* 36*  CREATININE 1.63* 1.53*  CALCIUM 10.8* 10.4*    PT/INR:  Recent Labs    08/04/21 0503  LABPROT 24.8*  INR 2.2*   ABG    Component Value Date/Time   PHART 7.368 07/25/2021 0436   HCO3 28.9 (H) 07/25/2021 0436   TCO2 30 07/25/2021 0436   ACIDBASEDEF 1.0 07/24/2021 0449   O2SAT 68.9 08/04/2021 0503   CBG (last 3)  Recent Labs    08/04/21 0651 08/04/21 1148 08/04/21 1635  GLUCAP 236* 191* 394*    Assessment/Plan:  POD 14 HM3 LVAD. He is doing well overall except for gout which seems to be improving. Continue ambulation as tolerated. Remains on prednisone 40 daily with hyperglycemia in 200 to almost 400 range. Will increase  Levemir to 40 bid and meal coverage novolog to 6 tid.    LOS: 17 days    Gaye Pollack 08/04/2021

## 2021-08-05 DIAGNOSIS — I5023 Acute on chronic systolic (congestive) heart failure: Secondary | ICD-10-CM | POA: Diagnosis not present

## 2021-08-05 DIAGNOSIS — Z95811 Presence of heart assist device: Secondary | ICD-10-CM | POA: Diagnosis not present

## 2021-08-05 LAB — MAGNESIUM: Magnesium: 1.8 mg/dL (ref 1.7–2.4)

## 2021-08-05 LAB — BASIC METABOLIC PANEL
Anion gap: 5 (ref 5–15)
BUN: 33 mg/dL — ABNORMAL HIGH (ref 6–20)
CO2: 26 mmol/L (ref 22–32)
Calcium: 10.1 mg/dL (ref 8.9–10.3)
Chloride: 105 mmol/L (ref 98–111)
Creatinine, Ser: 1.46 mg/dL — ABNORMAL HIGH (ref 0.61–1.24)
GFR, Estimated: 60 mL/min — ABNORMAL LOW (ref >60)
Glucose, Bld: 224 mg/dL — ABNORMAL HIGH (ref 70–99)
Potassium: 4 mmol/L (ref 3.5–5.1)
Sodium: 136 mmol/L (ref 135–145)

## 2021-08-05 LAB — CBC
HCT: 30.7 % — ABNORMAL LOW (ref 39.0–52.0)
Hemoglobin: 9.9 g/dL — ABNORMAL LOW (ref 13.0–17.0)
MCH: 23.7 pg — ABNORMAL LOW (ref 26.0–34.0)
MCHC: 32.2 g/dL (ref 30.0–36.0)
MCV: 73.4 fL — ABNORMAL LOW (ref 80.0–100.0)
Platelets: 328 10*3/uL (ref 150–400)
RBC: 4.18 MIL/uL — ABNORMAL LOW (ref 4.22–5.81)
RDW: 17.8 % — ABNORMAL HIGH (ref 11.5–15.5)
WBC: 13.9 10*3/uL — ABNORMAL HIGH (ref 4.0–10.5)
nRBC: 0.4 % — ABNORMAL HIGH (ref 0.0–0.2)

## 2021-08-05 LAB — GLUCOSE, CAPILLARY
Glucose-Capillary: 216 mg/dL — ABNORMAL HIGH (ref 70–99)
Glucose-Capillary: 177 mg/dL — ABNORMAL HIGH (ref 70–99)
Glucose-Capillary: 289 mg/dL — ABNORMAL HIGH (ref 70–99)
Glucose-Capillary: 274 mg/dL — ABNORMAL HIGH (ref 70–99)

## 2021-08-05 LAB — LACTATE DEHYDROGENASE: LDH: 171 U/L (ref 98–192)

## 2021-08-05 LAB — COOXEMETRY PANEL
Carboxyhemoglobin: 1.5 % (ref 0.5–1.5)
Methemoglobin: 0.9 % (ref 0.0–1.5)
O2 Saturation: 73.7 %
Total hemoglobin: 8.9 g/dL — ABNORMAL LOW (ref 12.0–16.0)

## 2021-08-05 LAB — PROTIME-INR
INR: 2.1 — ABNORMAL HIGH (ref 0.8–1.2)
Prothrombin Time: 23.8 seconds — ABNORMAL HIGH (ref 11.4–15.2)

## 2021-08-05 MED ORDER — SACUBITRIL-VALSARTAN 49-51 MG PO TABS
1.0000 | ORAL_TABLET | Freq: Two times a day (BID) | ORAL | Status: DC
  Administered 2021-08-05 – 2021-08-08 (×7): 1 via ORAL
  Filled 2021-08-05 (×8): qty 1

## 2021-08-05 MED ORDER — COLCHICINE 0.6 MG PO TABS
0.6000 mg | ORAL_TABLET | Freq: Every day | ORAL | Status: DC
  Administered 2021-08-06 – 2021-08-08 (×3): 0.6 mg via ORAL
  Filled 2021-08-05 (×3): qty 1

## 2021-08-05 NOTE — Progress Notes (Signed)
Pt declined ambulation now. Prefers to walk around 1 pm. Encouraged pt to make time for atleast 2 walks today and I will return around 1 pm. Discussed importance of IS. Pt sts he is hitting 1200 ml. Sts he is doing well with sternal precautions and moving around room. Will f/u later. No PT today so will need to walk with other staff in late pm. [id number] Yves Dill CES, ACSM 9:33 AM 08/05/2021

## 2021-08-05 NOTE — Progress Notes (Signed)
[place] for warfarin Indication:  LVAD  Allergies  Allergen Reactions   Aspirin Shortness Of Breath, Itching and Rash     Burning sensation (Patient reports he tolerates other NSAIDS)    Bee Venom Hives and Swelling   Lisinopril Cough   Shrimp (Diagnostic) Rash   Tomato Rash    Patient Measurements: Height: '5\' 7"'  (170.2 cm) Weight: 134.2 kg (295 lb 13.7 oz) IBW/kg (Calculated) : 66.1  Vital Signs: Temp: 97.6 F (36.4 C) (11/01 0728) Temp Source: Oral (11/01 0728) BP: 102/86 (11/01 0728) Pulse Rate: 77 (11/01 0728)  Labs: Recent Labs    08/03/21 0152 08/04/21 0503 08/05/21 0515  HGB 9.0* 9.3* 9.9*  HCT 27.8* 28.8* 30.7*  PLT 354 356 328  LABPROT 21.6* 24.8* 23.8*  INR 1.9* 2.2* 2.1*  HEPARINUNFRC <0.10*  --   --   CREATININE 1.63* 1.53* 1.46*     Estimated Creatinine Clearance: 83.4 mL/min (A) (by C-G formula based on SCr of 1.46 mg/dL (H)).   Medical History: Past Medical History:  Diagnosis Date   Anxiety    Aspirin allergy    Childhood asthma    Chronic systolic CHF (congestive heart failure) ([place])    a. EF 20-25% in 2012. b. EF 45-50% in 10/2011 with nonischemic nuc - presumed NICM. c. 12/2014 Echo: Sev depressed LV fxn, sev dil LV, mild LVH, mild MR, sev dil LA, mildly reduced RV fxn.   CKD (chronic kidney disease) stage 2, GFR 60-89 ml/min    H/O vasectomy 12/2019   High cholesterol    Hypertension    Morbid obesity ([place])    Nephrolithiasis    OSA on CPAP    Paroxysmal atrial fibrillation ([place])    Presumed NICM    a. 04/2014 Myoview: EF 26%, glob HK, sev glob HK, ? prior infarct;  b. Never cathed 2/2 CKD.   Renal cell carcinoma ([place])    a. s/p Rt robotic assisted partial converted to radical nephrectomy on 01/2013.   Troponin level elevated    a. 04/2014, 12/2014: felt due to CHF.   Type II diabetes mellitus ([facility])    Ventricular tachycardia    a. appropriate ICD therapy 12/2017    Medications:  Medications  Prior to Admission  Medication Sig Dispense Refill Last Dose   acetaminophen (TYLENOL) 325 MG tablet Take 2 tablets (650 mg total) by mouth every 6 (six) hours as needed for fever.   Past Week   allopurinol (ZYLOPRIM) 100 MG tablet Take 1 tablet (100 mg total) by mouth daily. 90 tablet 3 07/18/2021   ALPRAZolam (XANAX) 0.5 MG tablet Take 1 tablet (0.5 mg total) by mouth 2 (two) times daily. 60 tablet 4 Past Week   amiodarone (PACERONE) 200 MG tablet Take 0.5 tablets (100 mg total) by mouth daily. 30 tablet 3 07/17/2021   apixaban (ELIQUIS) 5 MG TABS tablet Take 1 tablet (5 mg total) by mouth 2 (two) times daily. 60 tablet 3 07/14/2021   diclofenac Sodium (VOLTAREN) 1 % GEL Apply 2 g topically 4 (four) times daily. 100 g 0 Past Week   docusate sodium (COLACE) 100 MG capsule Take 1 capsule (100 mg total) by mouth 2 (two) times daily. 10 capsule 0 Past Week   empagliflozin (JARDIANCE) 10 MG TABS tablet TAKE 1 TABLET (10 MG TOTAL) BY MOUTH DAILY BEFORE BREAKFAST. 90 tablet 3 07/18/2021   insulin aspart protamine - aspart (NOVOLOG 70/30 MIX) (70-30) 100 UNIT/ML FlexPen Inject 38 Units into the skin  2 (two) times daily. 15 mL 11 07/17/2021   Insulin Syringe-Needle U-100 (TRUEPLUS INSULIN SYRINGE) 31G X 5/16" 0.5 ML MISC USE TO INJECT INSULIN TWICE DAILY. 100 each 11 07/17/2021   losartan (COZAAR) 25 MG tablet Take 0.5 tablets (12.5 mg total) by mouth daily. 45 tablet 3 07/18/2021   metFORMIN (GLUCOPHAGE) 1000 MG tablet Take 1 tablet (1,000 mg total) by mouth 2 (two) times daily with a meal. 60 tablet 11 07/18/2021   pantoprazole (PROTONIX) 40 MG tablet Take 1 tablet (40 mg total) by mouth daily. 30 tablet 0 Past Week   polyethylene glycol (MIRALAX / GLYCOLAX) 17 g packet Take 17 g by mouth daily. 14 each 0 Past Week   sertraline (ZOLOFT) 25 MG tablet Take 1 tablet (25 mg total) by mouth daily. 30 tablet 3 07/18/2021   spironolactone (ALDACTONE) 25 MG tablet Take 1 tablet (25 mg total) by mouth daily. 30  tablet 6 07/18/2021   torsemide (DEMADEX) 20 MG tablet Take 1 tablet (20 mg total) by mouth daily as needed (If wt gain > 3 lb in 24 hr or > 5 lb in 1 week). 30 tablet 2 Past Week   traMADol (ULTRAM) 50 MG tablet Take 2 tablets (100 mg total) by mouth every 12 (twelve) hours as needed for moderate pain. 30 tablet 0 Past Week   traZODone (DESYREL) 100 MG tablet Take 1 tablet (100 mg total) by mouth at bedtime. 30 tablet 0 07/17/2021   nitroGLYCERIN (NITROSTAT) 0.4 MG SL tablet Place 1 tablet (0.4 mg total) under the tongue every 5 (five) minutes x 3 doses as needed for chest pain. 25 tablet 12 Unknown   Scheduled:   allopurinol  100 mg Oral Daily   amiodarone  200 mg Oral Daily   bupivacaine  20 mL Infiltration Once   chlorhexidine gluconate (MEDLINE KIT)  15 mL Mouth Rinse BID   Chlorhexidine Gluconate Cloth  6 each Topical Daily   colchicine  0.6 mg Oral Daily   docusate sodium  200 mg Oral Daily   gabapentin  300 mg Oral Daily   insulin aspart  0-20 Units Subcutaneous TID WC   insulin aspart  0-5 Units Subcutaneous QHS   insulin aspart  6 Units Subcutaneous TID WC   insulin detemir  40 Units Subcutaneous BID   isosorbide-hydrALAZINE  2 tablet Oral TID   mouth rinse  15 mL Mouth Rinse BID   methylPREDNISolone acetate  80 mg Intra-articular Once   pantoprazole  40 mg Oral Daily   polyethylene glycol  17 g Oral BID   predniSONE  40 mg Oral Q breakfast   sacubitril-valsartan  1 tablet Oral BID   senna  1 tablet Oral BID   sertraline  25 mg Oral Daily   sodium chloride flush  10-40 mL Intracatheter Q12H   sodium chloride flush  3 mL Intravenous Q12H   spironolactone  25 mg Oral Daily   traZODone  100 mg Oral QHS   warfarin  4 mg Oral q1600   Warfarin - Pharmacist Dosing Inpatient   Does not apply q1600   Infusions:   sodium chloride Stopped (07/29/21 1211)    Assessment: Patient is status post LVAD placement on 10/17. Warfarin was initiated on 10/18.   No bleeding reported. CBC  stable. S/p DCCV 10/23 begin heparin at 500 units/hr fixed dose with no titrations while awaiting therapeutic INR - stopped 10/30 INR 2.1 at goal after 2 boost doses over the weekend.   No s/sx of  bleeding or infusion issues.   Goal of Therapy:  INR 2-2.5 Heparin level <0.3 Monitor platelets by anticoagulation protocol: Yes   Plan:  Warfarin 4 mg daily  Monitor daily Protime, CBC, and for s/sx of bleed  Laurey Arrow, PharmD PGY1 Pharmacy Resident 08/05/2021  9:01 AM  Please check [email or web address] for unit-specific pharmacy phone numbers.

## 2021-08-05 NOTE — Plan of Care (Signed)
  Problem: Health Behavior/Discharge Planning: Goal: Ability to manage health-related needs will improve Outcome: Progressing   Problem: Activity: Goal: Risk for activity intolerance will decrease Outcome: Progressing   Problem: Nutrition: Goal: Adequate nutrition will be maintained Outcome: Progressing   Problem: Coping: Goal: Level of anxiety will decrease Outcome: Progressing   Problem: Elimination: Goal: Will not experience complications related to bowel motility Outcome: Progressing   Problem: Pain Managment: Goal: General experience of comfort will improve Outcome: Progressing   

## 2021-08-05 NOTE — TOC Initial Note (Addendum)
Transition of Care [facility]) - Initial/Assessment Note    Patient Details  Name: Jeffrey [name] MRN: [id number] Date of Birth: 02-May-1976  Transition of Care [facility]) CM/SW Contact:    Erenest Rasher, RN Phone Number: [phone] 08/05/2021, 12:04 PM  Clinical Narrative:                 HF TOC CM spoke pt and offered choice for [facility]. Pt agreeable to [facility]. States his wife and mother will be assist in the home. Contacted [facility] rep, Stacie with new referral for [facility]. Pt has RW at home.   Alvis Lemmings -declined [facility]-declined   Expected Discharge Plan: [place] Barriers to Discharge: Continued Medical Work up   Patient Goals and CMS Choice Patient states their goals for this hospitalization and ongoing recovery are:: would like to go home CMS Medicare.gov Compare Post Acute Care list provided to:: Patient Choice offered to / list presented to : Patient  Expected Discharge Plan and Services Expected Discharge Plan: [place] In-house Referral: Clinical Social Work Discharge Planning Services: CM Consult Post Acute Care Choice: [place] arrangements for the past 2 months: Single Family Home  HH Arranged: PT   Prior Living Arrangements/Services Living arrangements for the past 2 months: Single Family Home Lives with:: Spouse, Minor Children Patient language and need for interpreter reviewed:: Yes Do you feel safe going back to the place where you live?: Yes      Need for Family Participation in Patient Care: Yes (Comment) Care giver support system in place?: Yes (comment) Current home services: DME (rolling walker) Criminal Activity/Legal Involvement Pertinent to Current Situation/Hospitalization: No - Comment as needed  Activities of Daily Living Home Assistive Devices/Equipment: Walker (specify type) ADL Screening (condition at time of admission) Patient's cognitive ability adequate to safely complete daily activities?: Yes Is  the patient deaf or have difficulty hearing?: No Does the patient have difficulty seeing, even when wearing glasses/contacts?: No Does the patient have difficulty concentrating, remembering, or making decisions?: No Patient able to express need for assistance with ADLs?: Yes Does the patient have difficulty dressing or bathing?: No Independently performs ADLs?: Yes (appropriate for developmental age) Does the patient have difficulty walking or climbing stairs?: No Weakness of Legs: None Weakness of Arms/Hands: None  Permission Sought/Granted Permission sought to share information with : Case Manager, Family Supports, PCP Permission granted to share information with : Yes, Verbal Permission Granted  Share Information with NAME: Eduardo Osier  Permission granted to share info w AGENCY: [place] granted to share info w Relationship: wife  Permission granted to share info w Contact Information: [phone]  Emotional Assessment Appearance:: Appears stated age Attitude/Demeanor/Rapport: Gracious, Engaged Affect (typically observed): Accepting Orientation: : Oriented to Self, Oriented to Place, Oriented to  Time, Oriented to Situation   Psych Involvement: No (comment)  Admission diagnosis:  Acute on chronic systolic CHF (congestive heart failure) (HCC) [I50.23] Patient Active Problem List   Diagnosis Date Noted   Presence of left ventricular assist device (LVAD) ([place]) 07/25/2021   Acute on chronic systolic CHF (congestive heart failure) ([place]) 07/18/2021   Cardiogenic shock (HCC)    Atrial fibrillation with RVR ([place]) 04/30/2021   Acute hypoxemic respiratory failure (HCC)    Right wrist pain 04/16/2021   Absolute anemia    Rectal bleeding    Iron deficiency 12/25/2020   Long term current use of anticoagulant - apixaban 12/25/2020   Paroxysmal atrial fibrillation ([place])  Lactic acidosis 11/22/2020   Hypotension 11/22/2020   Aspiration into airway    ICD  (implantable cardioverter-defibrillator) battery depletion 05/15/2020   Endotracheal tube present    Respiratory failure ([place])    Acute encephalopathy    Acute pulmonary edema (HCC)    NICM (nonischemic cardiomyopathy) ([place]) 04/24/2020   S/P vasectomy 12/20/2019   Anxiety 12/20/2019   Hemoglobin A1C between 7% and 9% indicating borderline diabetic control ([place]) 12/20/2019   Seasonal allergies 12/20/2019   Insomnia 12/20/2019   Vasectomy evaluation 06/20/2019   Visual problems 06/20/2019   Blurry vision, bilateral 06/20/2019   Hyperglycemia 02/13/2019   Hyperosmolar (nonketotic) coma ([place]) 01/19/2019   AICD (automatic cardioverter/defibrillator) present 01/19/2019   Hyperlipidemia 12/07/2018   Follow-up exam 11/22/2018   Class 3 severe obesity due to excess calories with serious comorbidity and body mass index (BMI) of 45.0 to 49.9 in adult ([place]) 11/22/2018   Dizziness 11/22/2018   Lingular pneumonia 08/04/2018   Ventricular tachycardia 12/30/2017   PAF (paroxysmal atrial fibrillation) ([place])    Dilated cardiomyopathy ([place])    Pollen allergy 02/17/2017   Dyslipidemia 02/17/2017   Hematochezia 11/08/2015   CKD (chronic kidney disease), stage III ([place]) 06/20/2015   Cardiac defibrillator in situ    Chronic systolic CHF (congestive heart failure) ([place]) 03/11/2015   Gouty arthritis 03/11/2015   Chest pain 12/28/2014   Acute renal failure superimposed on stage 3 chronic kidney disease ([place]) 04/08/2014   Aspirin allergy 04/08/2014   Renal cell carcinoma ([place])    Gout attack 11/10/2011   Morbid obesity ([place]) 10/23/2011   OSA (obstructive sleep apnea) 10/23/2011   Type 2 diabetes mellitus with stage 3 chronic kidney disease, with long-term current use of insulin ([place]) 10/22/2011   Hyperlipidemia associated with type 2 diabetes mellitus ([place]) 12/25/2010   Hypertension associated with diabetes ([place]) 12/25/2010   PCP:  Bo Merino I, NP Pharmacy:   [facility] [address] [place] [address] Phone: [phone] Fax: [phone]  [id number] [place] [place] [address] Phone: [phone] Fax: [phone]  [id number] [place] [address] [place] [address] Phone: [phone] Fax: [phone]  [facility] [address] [place] [address] Phone: [phone] Fax: [phone]     Social Determinants of Health (SDOH) Interventions Depression Interventions/Treatment : Counseling  Readmission Risk Interventions No flowsheet data found.

## 2021-08-05 NOTE — Progress Notes (Signed)
CARDIAC REHAB PHASE I   PRE:  Rate/Rhythm: 90 SR    BP: sitting 96 dopplar    SaO2: 97 RA  MODE:  Ambulation: 200 ft   POST:  Rate/Rhythm: 105 ST    BP: sitting 110  dopplar    SaO2: 96 RA   Pt on batteries on arrival. stood independently and walked with RW, slow and steady. Exerted/SOB with distance. Rest x2 for SOB. To recliner after walk. BP elevated but overall did well. C/o knee pain when asked. Transferred to power cable independently. Progressing well. 1500 ml on IS. Encouraged another walk with staff later. [place], ACSM 08/05/2021 2:38 PM

## 2021-08-05 NOTE — Progress Notes (Signed)
Occupational Therapy Treatment Patient Details Name: Jeffrey [name] MRN: [id number] DOB: [birth year] Today's Date: 08/05/2021   History of present illness 45 y.o. male with class IV HF admitted 07/18/21 for pre-VAD optimization; has been relatively stable as outpatient with NYHA 3B symptoms. S/p RHC with swan placement 10/14. S/p HM3 LVAD implant on 10/17. Hypoxia overnight 10/19 requiring bipap. 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 Pt underwent cardioversion 10/23. PMH includes poorly controlled HTN, renal cell carcinoma s/p nephrectomy, CKD3, DM2, OSA, gout, morbid obesity, HF (EF<20%).   OT comments  Pt progressing towards established OT goals. Focused session on AE education, functional mobility in hallway, and toileting. Providing education on AE for LB bathing and dressing and pt performing at [facility] guard A level; issued AE. Pt performing functional mobility and toilet transfer with Min guard A. Continue to recommend dc to home and will continue to follow acutely as admitted. Updated OT goals.    Recommendations for follow up therapy are one component of a multi-disciplinary discharge planning process, led by the attending physician.  Recommendations may be updated based on patient status, additional functional criteria and insurance authorization.    Follow Up Recommendations  No OT follow up    Assistance Recommended at Discharge Intermittent Supervision/Assistance  Equipment Recommendations  BSC (bariatric)    Recommendations for Other Services      Precautions / Restrictions Precautions Precautions: Fall;Sternal;Other (comment) Precaution Comments: LVAD Restrictions Other Position/Activity Restrictions: Sternal precautions       Mobility Bed Mobility               General bed mobility comments: Sitting in recliner upon arrival    Transfers Overall transfer level: Needs assistance Equipment used: Rolling walker (2 wheels) Transfers: Sit to/from Stand Sit to  Stand: Min guard           General transfer comment: Min Guard A for safety     Balance Overall balance assessment: Needs assistance Sitting-balance support: No upper extremity supported;Feet supported Sitting balance-Leahy Scale: Good Sitting balance - Comments: able to maintain sitting EOB when switching LVAD from wall to battery power   Standing balance support: No upper extremity supported Standing balance-Leahy Scale: Fair Standing balance comment: can static stand and perform pericare without UE support; static and dynamic stability improved with RW to offload painful LEs                           ADL either performed or assessed with clinical judgement   ADL Overall ADL's : Needs assistance/impaired                 Upper Body Dressing : Minimal assistance;Sitting Upper Body Dressing Details (indicate cue type and reason): Min A for donning second gown like a jacket; assist for managing lines. Educating pt on compensaotry techniques for donning shirts while adhering to sternal precautions. Pt abel to switch to batteries without difficulty Lower Body Dressing: Min guard;With adaptive equipment;Sit to/from stand Lower Body Dressing Details (indicate cue type and reason): Educating pt on AE for LB dressing. Pt donning socks and pants with AE. Min Guard A for Office manager: Min guard;Ambulation;Regular Toilet;Rolling walker (2 wheels);Grab bars Toilet Transfer Details (indicate cue type and reason): min guard A for safety   Toileting - Clothing Manipulation Details (indicate cue type and reason): Educating pt on toilet hygiene and maintaining sternal rpecautions     Functional mobility during ADLs: Min guard;Rolling walker (2 wheels) General ADL  Comments: Session including AE education, mobility in hallway, and toilet transfer     [place]: Awake/alert Behavior During Therapy: WFL  for tasks assessed/performed Overall Cognitive Status: Within Functional Limits for tasks assessed                                 General Comments: Motivated despite gout pain          Exercises     Shoulder Instructions       General Comments HR 100s during activity    Pertinent Vitals/ Pain       Pain Assessment: Faces Faces Pain Scale: Hurts even more Pain Location: bilateral knees (R>L) Pain Descriptors / Indicators: Grimacing;Guarding Pain Intervention(s): Monitored during session;Limited activity within patient's tolerance;Repositioned  Home Living                                          Prior Functioning/Environment              Frequency  Min 2X/week        Progress Toward Goals  OT Goals(current goals can now be found in the care plan section)  Progress towards OT goals: Progressing toward goals  Acute Rehab OT Goals OT Goal Formulation: With patient Time For Goal Achievement: 08/19/21 Potential to Achieve Goals: Good ADL Goals Pt Will Perform Grooming: with modified independence;standing Pt Will Perform Upper Body Bathing: with modified independence;sitting;with adaptive equipment Pt Will Perform Lower Body Bathing: with modified independence;sit to/from stand;with adaptive equipment Pt Will Perform Upper Body Dressing: with modified independence;sitting Pt Will Perform Lower Body Dressing: with modified independence;sit to/from stand;with adaptive equipment Pt Will Transfer to Toilet: with modified independence;ambulating;regular height toilet Pt Will Perform Toileting - Clothing Manipulation and hygiene: with modified independence;sit to/from stand;sitting/lateral leans;with adaptive equipment Additional ADL Goal #1: Pt will change LVAD power sources and manage LVAD vest with supervision. Additional ADL Goal #2: Pt will generalize energy conservation strategies in ADL independently.  Plan Discharge plan remains  appropriate    Co-evaluation                 AM-PAC OT "6 Clicks" Daily Activity     Outcome Measure   Help from another person eating meals?: None Help from another person taking care of personal grooming?: A Little Help from another person toileting, which includes using toliet, bedpan, or urinal?: A Lot Help from another person bathing (including washing, rinsing, drying)?: A Little Help from another person to put on and taking off regular upper body clothing?: A Little Help from another person to put on and taking off regular lower body clothing?: A Little 6 Click Score: 18    End of Session Equipment Utilized During Treatment: Rolling walker (2 wheels)  OT Visit Diagnosis: Unsteadiness on feet (R26.81);Other abnormalities of gait and mobility (R26.89);Pain;Muscle weakness (generalized) (M62.81) Pain - Right/Left: Right Pain - part of body: Knee   Activity Tolerance Patient tolerated treatment well   Patient Left Other (comment) (toilet)   Nurse Communication Mobility status;Other (comment) (pt on toilet; slight headache)        Time: 8099-8338 OT Time Calculation (min): 38 min  Charges: OT General Charges $OT Visit: 1 Visit OT Treatments $Self Care/Home  Management : 38-52 mins  [place], OTR/L Acute Rehab Pager: [phone] Office: [place] 08/05/2021, 4:55 PM

## 2021-08-05 NOTE — Plan of Care (Signed)
  Problem: Education: Goal: Knowledge of General Education information will improve Description Including pain rating scale, medication(s)/side effects and non-pharmacologic comfort measures Outcome: Progressing   Problem: Clinical Measurements: Goal: Diagnostic test results will improve Outcome: Progressing Goal: Respiratory complications will improve Outcome: Progressing   

## 2021-08-05 NOTE — Progress Notes (Signed)
LVAD Coordinator Rounding Note:   Admitted 07/18/21 due to Dr. [name] for elective VAD placement.    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 Cyndia Bent under Destination Therapy criteria due to BMI and chronic kidney disease.    Pt laying in bed upon my arrival. States he is waiting for PT to come to walk in the hallway. Reports he has been getting out of bed and walking to the bathroom instead of using bedside commode. Reports knee pain has improved to 5/10 pain. Encouraged him to continue to get out of bed and ambulate as much as possible to build strength so he can go home. Awaiting PT assessment/recommendations HH PT vs CIR.   Dizziness and headache has improved with use of Meclizine. Head CT negative 10/30.   Discharge teaching completed with pt and his wife [name] yesterday with VAD coordinator.   Vital signs: Temp:  98.2 HR: 81 NSR Doppler:  88 Auto cuff: 99/63 (74) O2 Sat: 98% on RA Wt:  277>292>285>296.3>305.5>305.5>302.9>299.8>297.6>294.1>295.6>293.2>295.8 lbs   LVAD interrogation reveals:  Speed: 6200 Flow: 5.2 Power:  5.0 w PI: 3.2 Hct: 31   Alarms:  none Events: none   Fixed speed:  6200 Low speed limit: 5900   Drive Line: Existing VAD dressing is clean, dry, and intact. Drive line anchor intact x 2. Continue MWF dressing changes per VAD Coordinator, Nurse Davonna Belling, or trained caregiver. Next dressing change due 08/06/21.   Labs:  LDH trend: 318>372>312>302>243>225>211>214>186>176>171   INR trend: 1.3>1.9>1.9>2.0>2.3>1.9>1.7>1.5>1.3>2.2>2.1    Anticoagulation Plan: -INR Goal: 2.0 - 2.5  -ASA Dose: none due to allergy     Blood Products:  - 07/21/21 2 units FFP (intra op)   Device: - [employer] single ICD -Therapies: off    Respiratory: Extubated 07/22/21   Nitric Oxide:  Off 07/22/21   Infection:  - sputum culture 07/23/21>>> specimen rejected - urine culture 07/23/21>>>no growth (final) - blood culture 07/23/21>>>no growth final   VAD  Education: Discharge teaching completed 08/04/21.    Plan/Recommendations:  Call VAD Coordinator if any VAD equipment or drive line issues. Continue Mon/Wed/Fri dressing changes per VAD Coordinator, Nurse Davonna Belling, or wife [place].  [name] RN [place] Coordinator  Office: [phone]  24/7 Pager: [phone]

## 2021-08-05 NOTE — Progress Notes (Addendum)
Patient ID: Jeffrey [name], male   DOB: 25-Jun-1976, 45 y.o.   MRN: [id number]   Advanced Heart Failure VAD Team Note  PCP-Cardiologist: Glori Bickers, MD   Subjective:    10/17: HM3 LVAD implant (DT)  10/18: Extubated 10/23: DC-CV for A fib RVR--> NSR. Milrinone cut back to 0.125 mcg.   10/25: Bidil increased to 2 tabs tid.  10/26: Lasix and milrinone stopped. Had ramp echo with speed increased to 6100 10/27: VAD speed increased to 6200  10/28: Right knee aspirated, steroid injection.  10/30: Head CT for dizziness was negative   R Knee pain improved today. Able to ambulate in halls yesterday. Joint fluid aspirate NGTD.  WBC down 17>>16>>14. AF Remains on Prednisone 40 daily. CBGs elevated, 2-300s.   Co-ox 74%. INR 2.1  Creatinine stable 1.46  MAPs 80s-90s  Dizziness resolved w/ meclizine. He reports felt like previous vertigo attacks.   LVAD INTERROGATION:  HeartMate III LVAD:   Flow 5.1 liters /min, speed 6200 power 5.0 PI 2.8  No PI events this am.   Objective:    Vital Signs:   Temp:  [97.6 F (36.4 C)-98.3 F (36.8 C)] 97.6 F (36.4 C) (11/01 0728) Pulse Rate:  [77-95] 77 (11/01 0728) Resp:  [18-25] 23 (11/01 0728) BP: (102-135)/(79-106) 102/86 (11/01 0728) SpO2:  [95 %-99 %] 98 % (11/01 0728) Weight:  [134.2 kg] 134.2 kg (11/01 0500) Last BM Date: 08/04/21 Mean arterial Pressure 90s-100s    Intake/Output:   Intake/Output Summary (Last 24 hours) at 08/05/2021 0859 Last data filed at 08/05/2021 0600 Gross per 24 hour  Intake 270 ml  Output 590 ml  Net -320 ml     Physical Exam   General:  Lying comfortably in bed HEENT: normal Neck: no JVD. Carotids 2+ bilat; no bruits. No lymphadenopathy or thryomegaly appreciated. Cor: Mechanical heart sounds with LVAD hum. Lungs: clear Abdomen: soft, nontender, nondistended. No hepatosplenomegaly. No bruits or masses. Good bowel sounds. LVAD exit site: Dressing dry and intact. No erythema or drainage.  Stabilization device present. Driveline dressing changed daily per sterile technique. Extremities: no cyanosis, clubbing, rash, edema Neuro: alert & orientedx3, cranial nerves grossly intact. moves all 4 extremities w/o difficulty. Affect pleasant   Telemetry   NSR 80s (personally reviewed)  Labs   Basic Metabolic Panel: Recent Labs  Lab 08/01/21 0540 08/02/21 0552 08/03/21 0152 08/04/21 0503 08/05/21 0515  NA 135 135 137 135 136  K 3.9 5.1 4.9 4.1 4.0  CL 99 102 105 102 105  CO2 '30 26 23 27 26  ' GLUCOSE 144* 212* 206* 270* 224*  BUN 54* 54* 49* 36* 33*  CREATININE 1.57* 1.67* 1.63* 1.53* 1.46*  CALCIUM 10.4* 10.6* 10.8* 10.4* 10.1  MG 2.2 2.2 2.1 1.9 1.8    Liver Function Tests: No results for input(s): AST, ALT, ALKPHOS, BILITOT, PROT, ALBUMIN in the last 168 hours.  No results for input(s): LIPASE, AMYLASE in the last 168 hours. No results for input(s): AMMONIA in the last 168 hours.  CBC: Recent Labs  Lab 08/01/21 0540 08/02/21 0552 08/03/21 0152 08/04/21 0503 08/05/21 0515  WBC 15.9* 17.6* 16.0* 14.4* 13.9*  HGB 9.3* 9.3* 9.0* 9.3* 9.9*  HCT 29.1* 28.1* 27.8* 28.8* 30.7*  MCV 73.5* 73.4* 74.1* 73.8* 73.4*  PLT 468* 385 354 356 328    INR: Recent Labs  Lab 08/01/21 0540 08/02/21 0552 08/03/21 0152 08/04/21 0503 08/05/21 0515  INR 1.3* 1.6* 1.9* 2.2* 2.1*    Other results:   Imaging  No results found.   Medications:     Scheduled Medications:  allopurinol  100 mg Oral Daily   amiodarone  200 mg Oral Daily   bupivacaine  20 mL Infiltration Once   chlorhexidine gluconate (MEDLINE KIT)  15 mL Mouth Rinse BID   Chlorhexidine Gluconate Cloth  6 each Topical Daily   colchicine  0.6 mg Oral Daily   docusate sodium  200 mg Oral Daily   gabapentin  300 mg Oral Daily   insulin aspart  0-20 Units Subcutaneous TID WC   insulin aspart  0-5 Units Subcutaneous QHS   insulin aspart  6 Units Subcutaneous TID WC   insulin detemir  40 Units  Subcutaneous BID   isosorbide-hydrALAZINE  2 tablet Oral TID   mouth rinse  15 mL Mouth Rinse BID   methylPREDNISolone acetate  80 mg Intra-articular Once   pantoprazole  40 mg Oral Daily   polyethylene glycol  17 g Oral BID   predniSONE  40 mg Oral Q breakfast   sacubitril-valsartan  1 tablet Oral BID   senna  1 tablet Oral BID   sertraline  25 mg Oral Daily   sodium chloride flush  10-40 mL Intracatheter Q12H   sodium chloride flush  3 mL Intravenous Q12H   spironolactone  25 mg Oral Daily   traZODone  100 mg Oral QHS   warfarin  4 mg Oral q1600   Warfarin - Pharmacist Dosing Inpatient   Does not apply q1600    Infusions:  sodium chloride Stopped (07/29/21 1211)    PRN Medications: sodium chloride, acetaminophen, meclizine, ondansetron (ZOFRAN) IV, oxyCODONE, sodium chloride flush, sodium chloride flush, traMADol   Assessment/Plan:    1. Acute on Chronic systolic CHF - Nonischemic CM, end-stage - Echo (9/21): EF 20-25%.   - Echo  8/22: EF < 20%, severe LV dilation, moderate RV dysfunction.   - RHC (8/22): well compensated hemodynamics. - s/p S-ICD - Not transplant candidate with BMI - S/p HM3 LVAD 10/17 (DT) - CO-OX 74% off milrinone.  - Volume looks ok, no Lasix today.  - MAP 80s-90s today. On Bidil 2 tabs tid, spironolactone 25 mg daily and entresto 24/26 mg BID.  - Increase entresto to 49/51 mg BID  2. VAD  - s/p HM-3 placement 10/17 - LDH 186 => 232 => 236=>176.  - Off  diuretics.  - VAD interrogated personally. Parameters stable. - 10/26 Ramp echo with speed increased. Now on 6200  - INR 2.1 on warfarin.   - Continue neurontin for pocket pain  3 Atrial fibrillation: Paroxysmal - Had severe AF with RVR 7/22 s/p DC-CV x 2 - Seen by EP. Not a candidate for ablation due to body habitus  (could consider AVN ablation and CRT, if needed)  - Back in AF with RVR post VAD. Failed RAP x 3.  - 10/23 S/P DC-CV with restoration of NSR. - Now on po amio 200 mg daily.      - INR 2.2   4. CKD stage 3B  - Has solitary kidney.   - Baseline creatinine is 1.6-2.9.  - Creatinine 1.53 today.   5. OSA - Continue CPAP   6. DM2. Poorly controlled - On Jardiance. - Hgb A1c 9.4. - CBGs elevated w/ prednisone - SSI, increased 10/31   7. Hematuria - MRI and cystoscopy ok   8. Polyarticular gout. - severe, still with significant pain and has been unable to stand.   - Continue prednisone daily.    -  Continue colchicine daily.   - Consulted ortho => knee aspirated and steroid injected. Mobility still significantly limited by gout pain. Discussed with ortho yesterday. No change in management.  9. Headache/vertigo - CT head negative.  - Has history of vertigo with prior meclizine use.  - Now improved, continue meclizine prn  Discharge planning Will need to arrange for [facility] PT  I reviewed the LVAD parameters from today, and compared the results to the patient's prior recorded data.  No programming changes were made.  The LVAD is functioning within specified parameters.  The patient performs LVAD self-test daily.  LVAD interrogation was negative for any significant power changes, alarms or PI events/speed drops.  LVAD equipment check completed and is in good working order.  Back-up equipment present.   LVAD education done on emergency procedures and precautions and reviewed exit site care.  Length of Stay: [address] [place], PA-C 08/05/2021, 8:59 AM  VAD Team --- VAD ISSUES ONLY--- Pager [phone] (7am - 7am)  Advanced Heart Failure Team  Pager [phone] (M-F; 7a - 5p)   Patient seen and examined with the above-signed Advanced Practice Provider and/or Housestaff. I personally reviewed laboratory data, imaging studies and relevant notes. I independently examined the patient and formulated the important aspects of the plan. I have edited the note to reflect any of my changes or salient points. I have personally discussed the plan with the patient and/or  family.  Feels better. Knee pain much improved. Denies SOB, orthopnea or PND. Co-ox 74% Remains in NSR. INR 2.1 MAPs in 90s.   General:  Lying in bed No resp difficulty HEENT: normal Neck: supple. no JVD. Carotids 2+ bilat; no bruits. No lymphadenopathy or thryomegaly appreciated. Cor: PMI nondisplaced. Regular rate & rhythm. No rubs, gallops or murmurs. Lungs: clear Abdomen: obese  soft, nontender, nondistended. No hepatosplenomegaly. No bruits or masses. Good bowel sounds. Extremities: no cyanosis, clubbing, rash, edema R knee improved  Neuro: alert & orientedx3, cranial nerves grossly intact. moves all 4 extremities w/o difficulty. Affect pleasant  Feeling better. Thinks he can ambulated today. Volume status and co-ox ok. Remains in NSR. INR 2.1 (Discussed dosing with PharmD personally) VAD interrogated personally. Parameters stable.  Teaching complete. Await PT assessment today to determine dispo for home   Glori Bickers, MD  9:26 AM

## 2021-08-06 DIAGNOSIS — Z95811 Presence of heart assist device: Secondary | ICD-10-CM | POA: Diagnosis not present

## 2021-08-06 DIAGNOSIS — I5023 Acute on chronic systolic (congestive) heart failure: Secondary | ICD-10-CM | POA: Diagnosis not present

## 2021-08-06 LAB — LACTATE DEHYDROGENASE: LDH: 170 U/L (ref 98–192)

## 2021-08-06 LAB — MAGNESIUM
Magnesium: 1.7 mg/dL (ref 1.7–2.4)
Magnesium: 2.5 mg/dL — ABNORMAL HIGH (ref 1.7–2.4)

## 2021-08-06 LAB — CBC
HCT: 31.8 % — ABNORMAL LOW (ref 39.0–52.0)
Hemoglobin: 10.1 g/dL — ABNORMAL LOW (ref 13.0–17.0)
MCH: 23.4 pg — ABNORMAL LOW (ref 26.0–34.0)
MCHC: 31.8 g/dL (ref 30.0–36.0)
MCV: 73.8 fL — ABNORMAL LOW (ref 80.0–100.0)
Platelets: 361 10*3/uL (ref 150–400)
RBC: 4.31 MIL/uL (ref 4.22–5.81)
RDW: 17.7 % — ABNORMAL HIGH (ref 11.5–15.5)
WBC: 12.8 10*3/uL — ABNORMAL HIGH (ref 4.0–10.5)
nRBC: 0.2 % (ref 0.0–0.2)

## 2021-08-06 LAB — GLUCOSE, CAPILLARY
Glucose-Capillary: 161 mg/dL — ABNORMAL HIGH (ref 70–99)
Glucose-Capillary: 159 mg/dL — ABNORMAL HIGH (ref 70–99)
Glucose-Capillary: 254 mg/dL — ABNORMAL HIGH (ref 70–99)
Glucose-Capillary: 221 mg/dL — ABNORMAL HIGH (ref 70–99)

## 2021-08-06 LAB — BASIC METABOLIC PANEL
Anion gap: 7 (ref 5–15)
Anion gap: 7 (ref 5–15)
BUN: 30 mg/dL — ABNORMAL HIGH (ref 6–20)
BUN: 31 mg/dL — ABNORMAL HIGH (ref 6–20)
CO2: 23 mmol/L (ref 22–32)
CO2: 20 mmol/L — ABNORMAL LOW (ref 22–32)
Calcium: 9.9 mg/dL (ref 8.9–10.3)
Calcium: 9.6 mg/dL (ref 8.9–10.3)
Chloride: 106 mmol/L (ref 98–111)
Chloride: 105 mmol/L (ref 98–111)
Creatinine, Ser: 1.43 mg/dL — ABNORMAL HIGH (ref 0.61–1.24)
Creatinine, Ser: 1.48 mg/dL — ABNORMAL HIGH (ref 0.61–1.24)
GFR, Estimated: >60 mL/min (ref >60)
GFR, Estimated: 59 mL/min — ABNORMAL LOW (ref >60)
Glucose, Bld: 172 mg/dL — ABNORMAL HIGH (ref 70–99)
Glucose, Bld: 274 mg/dL — ABNORMAL HIGH (ref 70–99)
Potassium: 3.9 mmol/L (ref 3.5–5.1)
Potassium: 4.6 mmol/L (ref 3.5–5.1)
Sodium: 136 mmol/L (ref 135–145)
Sodium: 132 mmol/L — ABNORMAL LOW (ref 135–145)

## 2021-08-06 LAB — PROTIME-INR
INR: 2.2 — ABNORMAL HIGH (ref 0.8–1.2)
Prothrombin Time: 24.1 seconds — ABNORMAL HIGH (ref 11.4–15.2)

## 2021-08-06 MED ORDER — MAGNESIUM SULFATE 4 GM/100ML IV SOLN
4.0000 g | Freq: Once | INTRAVENOUS | Status: AC
  Administered 2021-08-06: 4 g via INTRAVENOUS
  Filled 2021-08-06: qty 100

## 2021-08-06 MED ORDER — GABAPENTIN 100 MG PO CAPS
200.0000 mg | ORAL_CAPSULE | Freq: Three times a day (TID) | ORAL | Status: DC
  Administered 2021-08-06 – 2021-08-08 (×7): 200 mg via ORAL
  Filled 2021-08-06 (×7): qty 2

## 2021-08-06 NOTE — TOC CM/SW Note (Addendum)
[place] [place] accepted referral for [facility] PT.   HF TOC CM contacted [place] to review referral for HHPT. Waiting confirmation. Meds will come up from [facility] for pt at time of dc.   [place], Heart Failure TOC CM [phone]

## 2021-08-06 NOTE — Progress Notes (Signed)
Came around 1330 with plans for ambulation however pt preparing for BR. On return, noted ICD shock. Will f/u tomorrow. Yves Dill CES, ACSM 2:11 PM 08/06/2021

## 2021-08-06 NOTE — Progress Notes (Signed)
LVAD Coordinator Rounding Note:   Admitted 07/18/21 due to Dr. [name] for elective VAD placement.    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 Cyndia Bent under Destination Therapy criteria due to BMI and chronic kidney disease.    Pt laying in bed upon my arrival. Reports knee pain has improved. Encouraged him to continue to get out of bed and ambulate as much as possible to build strength so he can go home. Awaiting PT assessment/recommendations HH PT vs CIR.   Dizziness and headache has improved with use of Meclizine. Head CT negative 10/30.   Discharge teaching completed with pt and his wife [name] 10/31 with VAD coordinator.   Vital signs: Temp:  98.4 HR: 81 NSR Doppler:  102 Auto cuff: 99/63 (74) O2 Sat: 98% on RA Wt:  277>292>285>296.3...>297.6>294.1>295.6>293.2>295.8>289.4 lbs   LVAD interrogation reveals:  Speed: 6200 Flow: 5.1 Power:  5.1 w PI: 2.6 Hct: 31   Alarms:  none Events: 1 PI event   Fixed speed:  6200 Low speed limit: 5900   Drive Line: Existing VAD dressing is clean, dry, and intact. Existing VAD dressing removed and site care performed using sterile technique by wife [facility] with bedside nurse observing. Drive line exit site cleaned with Chlora prep applicators x 2, allowed to dry, and gauze dressing with Silver strip applied. Exit site healing and unincorporated, the velour is fully implanted at exit site. Moderate amount of fatty necrosis drainge present. No redness, tenderness, foul odor or rash noted. Drive line anchor intact x 2. Continue MWF dressing changes per VAD Coordinator, Nurse Davonna Belling, or trained caregiver. Next dressing change due 08/08/21.     Labs:  LDH trend: 318>372>312>302>243>225>211>214>186>176>171>170   INR trend: 1.3>1.9>1.9>2.0>2.3>1.9>1.7>1.5>1.3>2.2>2.1>2.2    Anticoagulation Plan: -INR Goal: 2.0 - 2.5  -ASA Dose: none due to allergy     Blood Products:  - 07/21/21 2 units FFP (intra op)   Device: - [employer]  single ICD -Therapies: off    Respiratory: Extubated 07/22/21   Nitric Oxide:  Off 07/22/21   Infection:  - sputum culture 07/23/21>>> specimen rejected - urine culture 07/23/21>>>no growth (final) - blood culture 07/23/21>>>no growth final   VAD Education: Discharge teaching completed 08/04/21.    Plan/Recommendations:  Call VAD Coordinator if any VAD equipment or drive line issues. Continue Mon/Wed/Fri dressing changes per VAD Coordinator, Nurse Davonna Belling, or wife [place]. Possible d/c home tomorrow. Wife will plan to pick up pt at [place] RN [place] Coordinator  Office: [phone]  24/7 Pager: [phone]

## 2021-08-06 NOTE — Progress Notes (Signed)
PT Cancellation Note  Patient Details Name: Jeffrey [name] MRN: [id number] DOB: [birth year]   Cancelled Treatment:    Reason Eval/Treat Not Completed: Medical issues which prohibited therapy.  Pt's ICD fired earlier this afternoon.  RN asked to hold until the reason is determined. 08/06/2021  Ginger Carne., PT Acute Rehabilitation Services [phone]  (pager) [phone]  (office)   Tessie Fass Baylynn Shifflett 08/06/2021, 3:33 PM

## 2021-08-06 NOTE — Discharge Summary (Addendum)
Advanced Heart Failure Team  Discharge Summary   Patient ID: Jeffrey [name]: [id number], DOB/AGE: [birth year] 45 y.o. Admit date: 07/18/2021 D/C date:     08/08/2021   Primary Discharge Diagnoses:  Acute on Chronic Biventricular Heart Failure, End-Stage (Nonischemic Cardiomyopathy) S/p HM3 LVAD (DT, implanted 07/21/21) Atrial Fibrillation w/ RVR s/p DCCV CKD, Stage IIIb Type 2DM  OSA Polyarticular Gout  Vertigo   Hospital Course:   Jeffrey [name] is a 45 y.o. male with a history of  poorly controlled HTN, R renal cell carcinoma s/p nephrectomy, CKD IIIa, DM2, OSA, gout, morbid obesity and systolic HF due to NICM.    Echo in 2012 with EF 20-25%.  In 1/13 had nuclear study EF 45-50% suggestive of NICM.  Cath 8/16 showed normal coronaries and moderately elevated filling pressures with moderately depressed cardiac output. [place] s- ICD implanted.   Admitted 9/18 with ADHF in setting of new afib. Started on amiodarone. Pt diuresed and converted to NSR without requiring DCCV. Started on Eliquis for [facility].   Admitted 3/19 with ICD shock. Electrolytes stable. Echo 12/31/17 with LVEF 25-30%.    Pt admitted 8/21 for S-ICD generator change. Course was complicated with acute hypoxic/hypercapnic respiratory failure due to possible flash pulmonary edema. Had traumatic intubation resulting in aspiration and requiring ventilator support and short term pressor support.   Admitted 7/82 with A/C systolic CHF/cardiogenic shock and AF with RVR. He was placed on amio gtt and underwent cardioversion. Had recurrent AF and underwent cardioversion again. Echo showed EF < 20%, severe LV dilation, moderate RV dysfunction. Required NE and dobutamine, which were eventually weaned off. Hospitalization c/b by acute respiratory failure with hypoxia requiring mechanical ventilation. GDMT was limited by hypotension and VAD workup initiated. Underwent RHC, for VAD work up, which showed well-compensated hemodynamics.  Creatinine peaked at 4.65 improved to 1.58 which is his baseline. He was discharged to CIR, weight 299 lbs.   He was presented at [facility] and discussed with [facility] transplant team and he was approved for DT VAD. He was directly admitted 10/14 for RHC w/ swan placement for pre-VAD optimization. RHC showed moderately elevated biventricular pressures and moderately to severely decreased cardiac index, 1.9. PAPi 2.5. He was started on milrinone for optimization. Hemodynamics improved. Underwent HM3 LVAD by Dr. Cyndia Bent on 10/17 Extubated 10/18. Post-operative course c/b Afib w/ RVR. Failed RAP x 3. Underwent successful DCCV w/ restoration of NSR. Placed on amiodarone. Also developed polyarticular gout, treated w/ prednisone and colchicine. Ortho also consulted for Rt knee aspiration and steroid injection. Had vertigo, which improved w/ meclizine. Head CT was negative. Placed on gabapentin for pocket pain.  Had inappropriate shock from his ICD on 11/02 after device turned back on. ICD interrogated, shock d/t artifact rather than VT/VF. Device programmed to minimize change of recurrent shocks.  PT was consulted and recommended home health PT which has been arranged. VAD coordinators assisted patient and family w/ VAD teaching and dressing changes.   LVAD Interrogation HM III: Speed: 6200 Flow: 5.1 PI: 2.7  Power: 5.    Discharge Weight: 289.9 pounds.   Discharge Vitals: Blood pressure 102/83, pulse 84, temperature 98.3 F (36.8 C), resp. rate 18, height 5\' 7"  (1.702 m), weight 131.5 kg, SpO2 95 %.  Labs: Lab Results  Component Value Date   WBC 9.6 08/08/2021   HGB 10.6 (L) 08/08/2021   HCT 33.2 (L) 08/08/2021   MCV 74.3 (L) 08/08/2021   PLT 300 08/08/2021    Recent Labs  Lab 08/08/21 0343  NA 135  K 4.1  CL 109  CO2 22  BUN 32*  CREATININE 1.41*  CALCIUM 9.2  GLUCOSE 82   Lab Results  Component Value Date   CHOL 174 06/10/2021   HDL 37 (L) 06/10/2021   LDLCALC 115 (H) 06/10/2021   TRIG  120 06/10/2021   BNP (last 3 results) Recent Labs    07/22/21 0306 07/28/21 0351 08/04/21 0503  BNP 544.3* 676.1* 687.3*    ProBNP (last 3 results) No results for input(s): PROBNP in the last 8760 hours.   Diagnostic Studies/Procedures    RHC 07/18/21 Findings:   Ao = 118/83 (95) RA = 15 RV = 69/20 PA = 72/35 (48)  PCW = 25 Fick cardiac output/index = 4.6/1.9 Thermo CO/CI = 6.2/2.6  PVR = 5.0 (Fick) 3.7 (Thermo) FA sat = 91% PA sat = 50%, 50% PAPi = 2.5   Assessment: Moderately elevated biventricular pressures with moderately to severely decreased cardiac index   Discharge Medications   Allergies as of 08/08/2021       Reactions   Aspirin Shortness Of Breath, Itching, Rash    Burning sensation (Patient reports he tolerates other NSAIDS)    Bee Venom Hives, Swelling   Lisinopril Cough   Shrimp (diagnostic) Rash   Tomato Rash        Medication List     STOP taking these medications    ALPRAZolam 0.5 MG tablet Commonly known as: XANAX   Eliquis 5 MG Tabs tablet Generic drug: apixaban   Jardiance 10 MG Tabs tablet Generic drug: empagliflozin   losartan 25 MG tablet Commonly known as: COZAAR   nitroGLYCERIN 0.4 MG SL tablet Commonly known as: NITROSTAT   torsemide 20 MG tablet Commonly known as: Demadex       TAKE these medications    acetaminophen 325 MG tablet Commonly known as: TYLENOL Take 2 tablets (650 mg total) by mouth every 6 (six) hours as needed for fever.   allopurinol 100 MG tablet Commonly known as: ZYLOPRIM Take 1 tablet (100 mg total) by mouth daily.   amiodarone 200 MG tablet Commonly known as: PACERONE Take 1 tablet (200 mg total) by mouth daily. What changed: how much to take   BD Insulin Syringe U/F 31G X 5/16" 0.5 ML Misc Generic drug: Insulin Syringe-Needle U-100 USE TO INJECT INSULIN TWICE DAILY.   BiDil 20-37.5 MG tablet Generic drug: isosorbide-hydrALAZINE Take 2 tablets by mouth 3 (three) times  daily.   colchicine 0.6 MG tablet Take 1 tablet (0.6 mg total) by mouth daily.   diclofenac Sodium 1 % Gel Commonly known as: VOLTAREN Apply 2 g topically 4 (four) times daily.   docusate sodium 100 MG capsule Commonly known as: COLACE Take 1 capsule (100 mg total) by mouth 2 (two) times daily.   Entresto 49-51 MG Generic drug: sacubitril-valsartan Take 1 tablet by mouth 2 (two) times daily.   gabapentin 100 MG capsule Commonly known as: NEURONTIN Take 2 capsules (200 mg total) by mouth 3 (three) times daily.   meclizine 25 MG tablet Commonly known as: ANTIVERT Take 1 tablet (25 mg total) by mouth 3 (three) times daily as needed for dizziness.   metFORMIN 1000 MG tablet Commonly known as: GLUCOPHAGE Take 1 tablet (1,000 mg total) by mouth 2 (two) times daily with a meal.   NovoLOG Mix 70/30 FlexPen (70-30) 100 UNIT/ML FlexPen Generic drug: insulin aspart protamine - aspart Inject 38 Units into the skin 2 (two) times daily.  oxyCODONE 5 MG immediate release tablet Commonly known as: Oxy IR/ROXICODONE Take 1 tablet (5 mg total) by mouth every 8 (eight) hours as needed for severe pain.   pantoprazole 40 MG tablet Commonly known as: PROTONIX Take 1 tablet (40 mg total) by mouth daily.   polyethylene glycol 17 g packet Commonly known as: MIRALAX / GLYCOLAX Take 17 g by mouth daily.   sertraline 25 MG tablet Commonly known as: ZOLOFT Take 1 tablet (25 mg total) by mouth daily.   spironolactone 25 MG tablet Commonly known as: ALDACTONE Take 1 tablet (25 mg total) by mouth daily.   traMADol 50 MG tablet Commonly known as: ULTRAM Take 1-2 tablets (50-100 mg total) by mouth every 4 (four) hours as needed for moderate pain. What changed:  how much to take when to take this   traZODone 100 MG tablet Commonly known as: DESYREL Take 1 tablet (100 mg total) by mouth at bedtime.   warfarin 4 MG tablet Commonly known as: COUMADIN Take 1 tablet (4 mg total) by mouth  daily at 4 PM.               Durable Medical Equipment  (From admission, onward)           Start     Ordered   08/08/21 1010  For home use only DME 3 n 1  Once        08/08/21 1010            Disposition   The patient will be discharged in stable condition to home. Discharge Instructions     (HEART FAILURE PATIENTS) Call MD:  Anytime you have any of the following symptoms: 1) 3 pound weight gain in 24 hours or 5 pounds in 1 week 2) shortness of breath, with or without a dry hacking cough 3) swelling in the hands, feet or stomach 4) if you have to sleep on extra pillows at night in order to breathe.   Complete by: As directed    Amb Referral to Cardiac Rehabilitation   Complete by: As directed    Diagnosis: Heart Failure (see criteria below if ordering Phase II)   Heart Failure Type: Chronic Systolic & Diastolic Comment - VAD   After initial evaluation and assessments completed: Virtual Based Care may be provided alone or in conjunction with Phase 2 Cardiac Rehab based on patient barriers.: Yes   Diet - low sodium heart healthy   Complete by: As directed    INR  Goal: 2 - 2.5   Complete by: As directed    Goal: 2 - 2.5   Increase activity slowly   Complete by: As directed    Page VAD Coordinator at [phone]  Notify for: any VAD alarms, sustained elevations of power >10 watts, sustained drop in Pulse Index <3   Complete by: As directed    Notify for:  any VAD alarms sustained elevations of power >10 watts sustained drop in Pulse Index <3     Speed Settings:   Complete by: As directed    Fixed 6200 RPM Low 5900 RPM       Follow-up Information     [facility] Follow up.   Specialty: Cardiology Why: VAD Clinic  11/9 at 9:00 AM Contact information: [address] [phone] [place] [facility], [facility] Follow up.   Why: Home Health Physical Therapy-agency  will call to  arrange appt Contact information: [place] [address] [phone]                   Duration of Discharge Encounter: Greater than 35 minutes   Signed, Leata Mouse, PA-C  08/08/2021, 2:29 PM  Patient seen and examined with the above-signed Advanced Practice Provider and/or Housestaff. I personally reviewed laboratory data, imaging studies and relevant notes. I independently examined the patient and formulated the important aspects of the plan. I have edited the note to reflect any of my changes or salient points. I have personally discussed the plan with the patient and/or family.  He is doing well. [place] for d/c today. See rounding note from this am for further details. F/u next week in VAD clinic.  Glori Bickers, MD  5:31 PM

## 2021-08-06 NOTE — Progress Notes (Signed)
[place] for warfarin Indication:  LVAD  Allergies  Allergen Reactions   Aspirin Shortness Of Breath, Itching and Rash     Burning sensation (Patient reports he tolerates other NSAIDS)    Bee Venom Hives and Swelling   Lisinopril Cough   Shrimp (Diagnostic) Rash   Tomato Rash    Patient Measurements: Height: 5\' 7"  (170.2 cm) Weight: 131.3 kg (289 lb 7.4 oz) IBW/kg (Calculated) : 66.1  Vital Signs: Temp: 98.4 F (36.9 C) (11/02 0411) Temp Source: Oral (11/02 0411) BP: 91/80 (11/02 0411) Pulse Rate: 81 (11/02 0411)  Labs: Recent Labs    08/04/21 0503 08/05/21 0515 08/06/21 0441  HGB 9.3* 9.9* 10.1*  HCT 28.8* 30.7* 31.8*  PLT 356 328 361  LABPROT 24.8* 23.8* 24.1*  INR 2.2* 2.1* 2.2*  CREATININE 1.53* 1.46* 1.43*     Estimated Creatinine Clearance: 84.2 mL/min (A) (by C-G formula based on SCr of 1.43 mg/dL (H)).   Medical History: Past Medical History:  Diagnosis Date   Anxiety    Aspirin allergy    Childhood asthma    Chronic systolic CHF (congestive heart failure) ([place])    a. EF 20-25% in 2012. b. EF 45-50% in 10/2011 with nonischemic nuc - presumed NICM. c. 12/2014 Echo: Sev depressed LV fxn, sev dil LV, mild LVH, mild MR, sev dil LA, mildly reduced RV fxn.   CKD (chronic kidney disease) stage 2, GFR 60-89 ml/min    H/O vasectomy 12/2019   High cholesterol    Hypertension    Morbid obesity ([place])    Nephrolithiasis    OSA on CPAP    Paroxysmal atrial fibrillation ([place])    Presumed NICM    a. 04/2014 Myoview: EF 26%, glob HK, sev glob HK, ? prior infarct;  b. Never cathed 2/2 CKD.   Renal cell carcinoma ([place])    a. s/p Rt robotic assisted partial converted to radical nephrectomy on 01/2013.   Troponin level elevated    a. 04/2014, 12/2014: felt due to CHF.   Type II diabetes mellitus (H[facility])    Ventricular tachycardia    a. appropriate ICD therapy 12/2017   Assessment: Patient is status post LVAD placement on 10/17.  Warfarin was initiated on 10/18.   No bleeding reported. CBC stable. S/p DCCV 10/23.  INR stable at 2.2, will continue current dosing.  No s/sx of bleeding or infusion issues.   Goal of Therapy:  INR 2-2.5 Monitor platelets by anticoagulation protocol: Yes   Plan:  Continue Warfarin 4 mg daily  Monitor daily Protime, CBC, and for s/sx of bleed  Erin Hearing PharmD., BCPS Clinical Pharmacist 08/06/2021 8:32 AM

## 2021-08-06 NOTE — Progress Notes (Addendum)
Patient ID: Jeffrey [name], male   DOB: 07-20-1976, 45 y.o.   MRN: [id number]   Advanced Heart Failure VAD Team Note  PCP-Cardiologist: Glori Bickers, MD   Subjective:    10/17: HM3 LVAD implant (DT)  10/18: Extubated 10/23: DC-CV for A fib RVR--> NSR. Milrinone cut back to 0.125 mcg.   10/25: Bidil increased to 2 tabs tid.  10/26: Lasix and milrinone stopped. Had ramp echo with speed increased to 6100 10/27: VAD speed increased to 6200  10/28: Right knee aspirated, steroid injection.  10/30: Head CT for dizziness was negative   R Knee pain improving.   Ambulated 200 ft yesterday afternoon and this am.  Reports some left sided pain this am. Worse with inspiration and changes in position. Wondering if pocket pain.   Creatinine stable at 1.43  MAPs 80s overnight  Episode dizziness this am improved with meclizine.  LVAD INTERROGATION:  HeartMate III LVAD:   Flow 5.1 liters /min, speed 6200 power 5.0 PI 2.5  4 PI events   Objective:    Vital Signs:   Temp:  [98.2 F (36.8 C)-98.4 F (36.9 C)] 98.4 F (36.9 C) (11/02 0411) Pulse Rate:  [69-95] 81 (11/02 0411) Resp:  [16-25] 16 (11/02 0411) BP: (91-106)/(63-80) 91/80 (11/02 0411) SpO2:  [95 %-98 %] 95 % (11/02 0411) Weight:  [131.3 kg] 131.3 kg (11/02 0500) Last BM Date: 08/05/21 Mean arterial Pressure 90s-100s    Intake/Output:   Intake/Output Summary (Last 24 hours) at 08/06/2021 0929 Last data filed at 08/05/2021 2000 Gross per 24 hour  Intake 853 ml  Output 650 ml  Net 203 ml     Physical Exam   General:  Lying in bed comfortably. HEENT: normal Neck: supple. no JVD. Carotids 2+ bilat; no bruits. No lymphadenopathy or thryomegaly appreciated. Cor: PMI nondisplaced. Regular rate & rhythm. No rubs, gallops or murmurs. Lungs: clear Abdomen: soft, nontender, nondistended. No hepatosplenomegaly. No bruits or masses. Good bowel sounds. LVAD exit site: Dressing dry and intact. No erythema or drainage.  Stabilization device present. Driveline dressing changed daily per sterile technique. Extremities: no cyanosis, clubbing, rash, edema Neuro: alert & orientedx3, cranial nerves grossly intact. moves all 4 extremities w/o difficulty. Affect pleasant   Telemetry   NSR 80s - 90s, 1 6 beat run NSVT  Labs   Basic Metabolic Panel: Recent Labs  Lab 08/02/21 0552 08/03/21 0152 08/04/21 0503 08/05/21 0515 08/06/21 0441  NA 135 137 135 136 136  K 5.1 4.9 4.1 4.0 3.9  CL 102 105 102 105 106  CO2 _0 GLUCOSE 212* 206* 270* 224* 172*  BUN 54* 49* 36* 33* 30*  CREATININE 1.67* 1.63* 1.53* 1.46* 1.43*  CALCIUM 10.6* 10.8* 10.4* 10.1 9.9  MG 2.2 2.1 1.9 1.8 1.7    Liver Function Tests: No results for input(s): AST, ALT, ALKPHOS, BILITOT, PROT, ALBUMIN in the last 168 hours.  No results for input(s): LIPASE, AMYLASE in the last 168 hours. No results for input(s): AMMONIA in the last 168 hours.  CBC: Recent Labs  Lab 08/02/21 0552 08/03/21 0152 08/04/21 0503 08/05/21 0515 08/06/21 0441  WBC 17.6* 16.0* 14.4* 13.9* 12.8*  HGB 9.3* 9.0* 9.3* 9.9* 10.1*  HCT 28.1* 27.8* 28.8* 30.7* 31.8*  MCV 73.4* 74.1* 73.8* 73.4* 73.8*  PLT 385 354 356 328 361    INR: Recent Labs  Lab 08/02/21 0552 08/03/21 0152 08/04/21 0503 08/05/21 0515 08/06/21 0441  INR 1.6* 1.9* 2.2* 2.1* 2.2*  Other results:   Imaging   No results found.   Medications:     Scheduled Medications:  allopurinol  100 mg Oral Daily   amiodarone  200 mg Oral Daily   bupivacaine  20 mL Infiltration Once   chlorhexidine gluconate (MEDLINE KIT)  15 mL Mouth Rinse BID   Chlorhexidine Gluconate Cloth  6 each Topical Daily   colchicine  0.6 mg Oral Daily   docusate sodium  200 mg Oral Daily   gabapentin  300 mg Oral Daily   insulin aspart  0-20 Units Subcutaneous TID WC   insulin aspart  0-5 Units Subcutaneous QHS   insulin aspart  6 Units Subcutaneous TID WC   insulin detemir  40 Units  Subcutaneous BID   isosorbide-hydrALAZINE  2 tablet Oral TID   mouth rinse  15 mL Mouth Rinse BID   pantoprazole  40 mg Oral Daily   polyethylene glycol  17 g Oral BID   predniSONE  40 mg Oral Q breakfast   sacubitril-valsartan  1 tablet Oral BID   senna  1 tablet Oral BID   sertraline  25 mg Oral Daily   sodium chloride flush  10-40 mL Intracatheter Q12H   sodium chloride flush  3 mL Intravenous Q12H   spironolactone  25 mg Oral Daily   traZODone  100 mg Oral QHS   warfarin  4 mg Oral q1600   Warfarin - Pharmacist Dosing Inpatient   Does not apply q1600    Infusions:  sodium chloride Stopped (07/29/21 1211)   magnesium sulfate bolus IVPB      PRN Medications: sodium chloride, acetaminophen, meclizine, ondansetron (ZOFRAN) IV, oxyCODONE, sodium chloride flush, sodium chloride flush, traMADol   Assessment/Plan:    1. Acute on Chronic systolic CHF - Nonischemic CM, end-stage - Echo (9/21): EF 20-25%.   - Echo  8/22: EF < 20%, severe LV dilation, moderate RV dysfunction.   - RHC (8/22): well compensated hemodynamics. - s/p S-ICD - Not transplant candidate with BMI - S/p HM3 LVAD 10/17 (DT) - Off milrinone - Volume looks ok, no Lasix today.  - MAP 80s today. Continue Bidil 2 tabs tid, spironolactone 25 mg daily and entresto 40/51 mg BID.   2. VAD  - s/p HM-3 placement 10/17 - LDH 186 => 232 => 236 =>176 => 170 - Off  diuretics.  - VAD interrogated personally. Parameters stable. - 10/26 Ramp echo with speed increased. Now on 6200  - 1 6 beat run NSVT overnight - INR 2.2 on warfarin.   - On neurontin for pocket pain. Increased pain this am after several walks. Discussed with PharmD. Will adjust dosing. No fevers, WBC stable. No cough.  3 Atrial fibrillation: Paroxysmal - Had severe AF with RVR 7/22 s/p DC-CV x 2 - Seen by EP. Not a candidate for ablation due to body habitus  (could consider AVN ablation and CRT, if needed)  - Back in AF with RVR post VAD. Failed RAP x  3.  - 10/23 S/P DC-CV with restoration of NSR. - Now on po amio 200 mg daily.     - INR 2.2   4. CKD stage 3B  - Has solitary kidney.   - Baseline creatinine is 1.6-2.9.  - Creatinine 1.43 today.   5. OSA - Continue CPAP   6. DM2. Poorly controlled - On Jardiance. - Hgb A1c 9.4. - CBGs elevated w/ prednisone - SSI, increased 10/31   7. Hematuria - MRI and cystoscopy ok   8.  Polyarticular gout. - severe, still with significant pain and has been unable to stand.   - Continue prednisone daily.    - Continue colchicine daily.   - Consulted ortho => knee aspirated and steroid injected. Pain improving gradually.  9. Headache/vertigo - CT head negative.  - Has history of vertigo with prior meclizine use.  - Now improved, continue meclizine prn  Discharge planning Will need to arrange for [facility] PT TOC consulted   I reviewed the LVAD parameters from today, and compared the results to the patient's prior recorded data.  No programming changes were made.  The LVAD is functioning within specified parameters.  The patient performs LVAD self-test daily.  LVAD interrogation was negative for any significant power changes, alarms or PI events/speed drops.  LVAD equipment check completed and is in good working order.  Back-up equipment present.   LVAD education done on emergency procedures and precautions and reviewed exit site care.  Length of Stay: [id number] [place], PA-C 08/06/2021, 9:29 AM  VAD Team --- VAD ISSUES ONLY--- Pager [phone] (7am - 7am)  Advanced Heart Failure Team  Pager [phone] (M-F; 7a - 5p)    Patient seen and examined with the above-signed Advanced Practice Provider and/or Housestaff. I personally reviewed laboratory data, imaging studies and relevant notes. I independently examined the patient and formulated the important aspects of the plan. I have edited the note to reflect any of my changes or salient points. I have personally discussed the plan with the  patient and/or family.  Says his knee remains sore. Denies CP or SOB. Remains in NSR. INR 2.2  General:  NAD.  HEENT: normal  Neck: supple. JVP not elevated.  Carotids 2+ bilat; no bruits. No lymphadenopathy or thryomegaly appreciated. Cor: LVAD hum.  Lungs: Clear. Abdomen: obese soft, nontender, non-distended. No hepatosplenomegaly. No bruits or masses. Good bowel sounds. Driveline site clean. Anchor in place.  Extremities: no cyanosis, clubbing, rash. Warm no edema  Neuro: alert & oriented x 3. No focal deficits. Moves all 4 without problem    Volume status looks good. Able to walk with CR/PT. Volume status looks good. Remains in NSR. VAD interrogated personally. Parameters stable.  Hopefully can get him home tomorrow if family home to support him.   Glori Bickers, MD  12:47 PM

## 2021-08-06 NOTE — Progress Notes (Signed)
CSW met with patient at bedside who reports he had "just had my heart shocked". Patient reports he was feeling better from the gout and starting to ambulate but now feeling a bit overwhelmed with today's medical event. Patient states he is hopeful to go home soon. CSW provided supportive intervention and will follow up as needed. Raquel Sarna, [place], [place]

## 2021-08-06 NOTE — Progress Notes (Signed)
NT disconnected tele monitoring so patient could ambulate to bathroom, patient sitting on the side of the bed getting ready to stand when he stated he had a "pounding" feeling to his chest. Patient screaming and this RN ran to room to find patient sitting on side of the bed clenching chest diaphoretic and pale, RN advised patient to lay back in bed and reconnected to monitor- NSR 90's noted,  Patient alert and oriented and describes the pounding feeling in his chest at least one time. NO complaints of dizziness or feeling of the heart beating fast prior to shock. Advanced heart failure team made aware and came to bedside. Patient is tearful and worried about what caused it and it happening again. Patient remains in bed, patients mother at bedside. RN offered emotional support. Awaiting device interrogation from rep. Will monitor.

## 2021-08-06 NOTE — Progress Notes (Signed)
Called to bedside to evaluate patient after shock from ICD around 1:30 pm. Had just moved to edge of bed to get up and use restroom.  No dizziness or dyspnea.  No LOC at time of discharge.   Seen at bedside. Feels okay. Tearful.   VAD interrogated: Flow 5.0 Speed 6200 PI 3.1 Power 5.1 No events  Telemetry disconnected at time of episode.   Currently NSR 80s  Mag 1.7, already received 4 g IV mag this am.  Recheck BMET and magnesium level.   Device rep contacted to interrogate ICD.

## 2021-08-07 DIAGNOSIS — Z95811 Presence of heart assist device: Secondary | ICD-10-CM | POA: Diagnosis not present

## 2021-08-07 DIAGNOSIS — I5023 Acute on chronic systolic (congestive) heart failure: Secondary | ICD-10-CM | POA: Diagnosis not present

## 2021-08-07 LAB — BASIC METABOLIC PANEL
Anion gap: 4 — ABNORMAL LOW (ref 5–15)
BUN: 32 mg/dL — ABNORMAL HIGH (ref 6–20)
CO2: 24 mmol/L (ref 22–32)
Calcium: 9.5 mg/dL (ref 8.9–10.3)
Chloride: 108 mmol/L (ref 98–111)
Creatinine, Ser: 1.47 mg/dL — ABNORMAL HIGH (ref 0.61–1.24)
GFR, Estimated: 59 mL/min — ABNORMAL LOW (ref >60)
Glucose, Bld: 128 mg/dL — ABNORMAL HIGH (ref 70–99)
Potassium: 4.2 mmol/L (ref 3.5–5.1)
Sodium: 136 mmol/L (ref 135–145)

## 2021-08-07 LAB — GLUCOSE, CAPILLARY
Glucose-Capillary: 143 mg/dL — ABNORMAL HIGH (ref 70–99)
Glucose-Capillary: 165 mg/dL — ABNORMAL HIGH (ref 70–99)
Glucose-Capillary: 166 mg/dL — ABNORMAL HIGH (ref 70–99)
Glucose-Capillary: 184 mg/dL — ABNORMAL HIGH (ref 70–99)

## 2021-08-07 LAB — LACTATE DEHYDROGENASE: LDH: 172 U/L (ref 98–192)

## 2021-08-07 LAB — PROTIME-INR
INR: 2.2 — ABNORMAL HIGH (ref 0.8–1.2)
Prothrombin Time: 24.6 seconds — ABNORMAL HIGH (ref 11.4–15.2)

## 2021-08-07 LAB — CBC
HCT: 31.3 % — ABNORMAL LOW (ref 39.0–52.0)
Hemoglobin: 10.2 g/dL — ABNORMAL LOW (ref 13.0–17.0)
MCH: 23.9 pg — ABNORMAL LOW (ref 26.0–34.0)
MCHC: 32.6 g/dL (ref 30.0–36.0)
MCV: 73.5 fL — ABNORMAL LOW (ref 80.0–100.0)
Platelets: 335 10*3/uL (ref 150–400)
RBC: 4.26 MIL/uL (ref 4.22–5.81)
RDW: 17.8 % — ABNORMAL HIGH (ref 11.5–15.5)
WBC: 13.4 10*3/uL — ABNORMAL HIGH (ref 4.0–10.5)
nRBC: 0 % (ref 0.0–0.2)

## 2021-08-07 LAB — MAGNESIUM: Magnesium: 2.2 mg/dL (ref 1.7–2.4)

## 2021-08-07 MED ORDER — CHLORHEXIDINE GLUCONATE 0.12 % MT SOLN
OROMUCOSAL | Status: AC
  Filled 2021-08-07: qty 15

## 2021-08-07 NOTE — Progress Notes (Signed)
LVAD Coordinator Rounding Note:   Admitted 07/18/21 due to Dr. [name] for elective VAD placement.    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 Cyndia Bent under Destination Therapy criteria due to BMI and chronic kidney disease.    Pt sitting on side of bed. Reports knee pain has improved and he has been "up and walking". Planned discharge today, but ICD fired yesterday and patient very anxious about it. He says Dr. [name] wants to keep him one more day with possible discharge home tomorrow.   Vital signs: Temp:  98.3 HR: 75 NSR Doppler:  92 Auto cuff:  101/83 (90) O2 Sat: 95% on RA Wt:  277>292>285>296.3...>297.6>294.1>295.6>293.2>295.8>289.4>290 lbs   LVAD interrogation reveals:  Speed: 6200 Flow: 5.3 Power:  5.0 w PI: 2.4 Hct: 31   Alarms:  none Events:  10 PI events on 08/06/21   Fixed speed:  6200 Low speed limit: 5900   Drive Line: Existing VAD dressing is clean, dry, and intact. Anchor intact and applied correctly. Continue MWF dressing changes per VAD Coordinator, Nurse Davonna Belling, or trained caregiver. Next dressing change due 08/08/21.    Labs:  LDH trend: 318>372>312>302>243>225>211>214>186>176>171>170>172   INR trend: 1.3>1.9>1.9>2.0>2.3>1.9>1.7>1.5>1.3>2.2>2.1>2.2>2.2    Anticoagulation Plan: -INR Goal: 2.0 - 2.5  -ASA Dose: none due to allergy     Blood Products:  - 07/21/21 2 units FFP (intra op)   Device: - [employer] single ICD -Therapies: on per BS rep on 08/06/21   Respiratory: Extubated 07/22/21   Nitric Oxide:  Off 07/22/21   Infection:  - sputum culture 07/23/21>>> specimen rejected - urine culture 07/23/21>>>no growth (final) - blood culture 07/23/21>>>no growth final   VAD Education: Discharge teaching completed 08/04/21.    Plan/Recommendations:  Call VAD Coordinator if any VAD equipment or drive line issues. Continue Mon/Wed/Fri dressing changes per VAD Coordinator, Nurse Davonna Belling, or wife [place]. Possible d/c home tomorrow.     Zada Girt RN [place] Coordinator  Office: [phone]  24/7 Pager: [phone]

## 2021-08-07 NOTE — Progress Notes (Signed)
Occupational Therapy Treatment Patient Details Name: Jeffrey [name] MRN: [id number] DOB: 1976/01/28 Today's Date: 08/07/2021   History of present illness 45 y.o. male with class IV HF admitted 07/18/21 for pre-VAD optimization; has been relatively stable as outpatient with NYHA 3B symptoms. S/p RHC with swan placement 10/14. S/p HM3 LVAD implant on 10/17. Hypoxia overnight 10/19 requiring bipap. 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 Pt underwent cardioversion 10/23. ICD shocked 11/2; device interrogated, no arrhythmias. PMH includes poorly controlled HTN, renal cell carcinoma s/p nephrectomy, CKD3, DM2, OSA, gout, morbid obesity, HF (EF<20%).   OT comments  Pt is knowledgeable in use of AE for LB ADL, management of LVAD vest and power sources, energy conservation and sternal precautions. Able to stand at sink for grooming modified independently. Pt is eager to go home tomorrow.    Recommendations for follow up therapy are one component of a multi-disciplinary discharge planning process, led by the attending physician.  Recommendations may be updated based on patient status, additional functional criteria and insurance authorization.    Follow Up Recommendations  No OT follow up    Assistance Recommended at Discharge Intermittent Supervision/Assistance  Equipment Recommendations  BSC (bariatric)    Recommendations for Other Services      Precautions / Restrictions Precautions Precautions: Fall;Sternal;Other (comment) (LVAD) Precaution Comments: LVAD Restrictions Weight Bearing Restrictions: No       Mobility Bed Mobility Overal bed mobility: Independent Bed Mobility: Supine to Sit;Sit to Supine           General bed mobility comments: Indep with bed flat    Transfers Overall transfer level: Modified independent Equipment used: Rolling walker (2 wheels)                     Balance Overall balance assessment: Modified Independent Sitting-balance support: No  upper extremity supported;Feet supported Sitting balance-Leahy Scale: Good     Standing balance support: No upper extremity supported;During functional activity Standing balance-Leahy Scale: Fair Standing balance comment: at sink                           ADL either performed or assessed with clinical judgement   ADL Overall ADL's : Needs assistance/impaired     Grooming: Oral care;Standing;Modified independent     Upper Body Bathing Details (indicate cue type and reason): reinforced use of long handled bath sponge for back   Lower Body Bathing Details (indicate cue type and reason): reinforced use of long handled bath sponge and reacher for washing and drying feet     Lower Body Dressing: Set up;Sitting/lateral leans         Toileting - Clothing Manipulation Details (indicate cue type and reason): did not observe, but pt reports being able to perform pericare without assist     Functional mobility during ADLs: Modified independent;Rolling walker (2 wheels) General ADL Comments: Reinforced energy conservation strategies.     Vision       Perception     Praxis      Cognition Arousal/Alertness: Awake/alert Behavior During Therapy: WFL for tasks assessed/performed Overall Cognitive Status: Within Functional Limits for tasks assessed                                            Exercises     Shoulder Instructions        Pertinent  Vitals/ Pain       Pain Assessment: Faces Faces Pain Scale: Hurts a little bit Pain Location: knees, pocket pain Pain Descriptors / Indicators: Discomfort Pain Intervention(s): Monitored during session  Home Living                                          Prior Functioning/Environment              Frequency  Min 2X/week        Progress Toward Goals  OT Goals(current goals can now be found in the care plan section)  Progress towards OT goals: Progressing toward  goals  Acute Rehab OT Goals OT Goal Formulation: With patient Time For Goal Achievement: 08/19/21 Potential to Achieve Goals: Good  Plan Discharge plan remains appropriate    Co-evaluation                 AM-PAC OT "6 Clicks" Daily Activity     Outcome Measure   Help from another person eating meals?: None Help from another person taking care of personal grooming?: A Little Help from another person toileting, which includes using toliet, bedpan, or urinal?: A Little Help from another person bathing (including washing, rinsing, drying)?: A Little   Help from another person to put on and taking off regular lower body clothing?: A Little 6 Click Score: 16    End of Session Equipment Utilized During Treatment: Rolling walker (2 wheels)  OT Visit Diagnosis: Unsteadiness on feet (R26.81);Other abnormalities of gait and mobility (R26.89);Pain;Muscle weakness (generalized) (M62.81)   Activity Tolerance Patient tolerated treatment well   Patient Left in bed;with call [place]/phone within reach;with nursing/sitter in room   Nurse Communication          Time: 4332-9518 OT Time Calculation (min): 15 min  Charges: OT General Charges $OT Visit: 1 Visit OT Treatments $Self Care/Home Management : 8-22 mins  Jeffrey [name], Jeffrey [name] Acute Rehabilitation Services Pager: [phone] Office: [phone]   Jeffrey [name] 08/07/2021, 12:21 PM

## 2021-08-07 NOTE — Progress Notes (Addendum)
Patient ID: DARNELLE CORP, male   DOB: 1976-04-24, 45 y.o.   MRN: [id number]   Advanced Heart Failure VAD Team Note  PCP-Cardiologist: Glori Bickers, MD   Subjective:    10/17: HM3 LVAD implant (DT)  10/18: Extubated 10/23: DC-CV for A fib RVR--> NSR. Milrinone cut back to 0.125 mcg.   10/25: Bidil increased to 2 tabs tid.  10/26: Lasix and milrinone stopped. Had ramp echo with speed increased to 6100 10/27: VAD speed increased to 6200  10/28: Right knee aspirated, steroid injection.  10/30: Head CT for dizziness was negative  08/06/21: ICD turned on. Got shocked. Device interrogated with adjustments. No arrhythmias.    Creatinine stable at 1.5  MAPs 80s   Anxious about going home. Still with pocket pain but not as bad. Denies SOB. Having some dizziness but improved after taking meclizine.   LVAD INTERROGATION:  HeartMate III LVAD:   Flow 5.1 liters /min, speed 6200 power 5.0 PI 3. Rare PI events.    Objective:    Vital Signs:   Temp:  [98 F (36.7 C)-98.5 F (36.9 C)] 98.3 F (36.8 C) (11/03 0736) Pulse Rate:  [68-83] 75 (11/03 0319) Resp:  [19-24] 19 (11/03 0736) BP: (95-113)/(59-98) 101/83 (11/03 0736) SpO2:  [95 %-98 %] 95 % (11/03 0319) Weight:  [131.7 kg] 131.7 kg (11/03 0630) Last BM Date: 08/06/21 Mean arterial Pressure 80-90s     Intake/Output:   Intake/Output Summary (Last 24 hours) at 08/07/2021 0755 Last data filed at 08/07/2021 0319 Gross per 24 hour  Intake 600 ml  Output 500 ml  Net 100 ml     Physical Exam  Physical Exam: GENERAL: No acute distress. In bed.  HEENT: normal  NECK: Supple, JVP flat.   2+ bilaterally, no bruits.  No lymphadenopathy or thyromegaly appreciated.   CARDIAC:  Mechanical heart sounds with LVAD hum present.  LUNGS:  Clear to auscultation bilaterally.  ABDOMEN:  Soft, round, nontender, positive bowel sounds x4.     LVAD exit site:  Dressing dry and intact.  No erythema or drainage.  Stabilization device present and  accurately applied.  Driveline dressing is being changed daily per sterile technique. EXTREMITIES:  Warm and dry, no cyanosis, clubbing, rash or edema . RUE PICC NEUROLOGIC:  Alert and oriented x 3.    No aphasia.  No dysarthria.  Affect pleasant.      Telemetry   NSR 80-90s . No VT overnight.   Labs   Basic Metabolic Panel: Recent Labs  Lab 08/04/21 0503 08/05/21 0515 08/06/21 0441 08/06/21 1412 08/07/21 0346  NA 135 136 136 132* 136  K 4.1 4.0 3.9 4.6 4.2  CL 102 105 106 105 108  CO2 _0 20* 24  GLUCOSE 270* 224* 172* 274* 128*  BUN 36* 33* 30* 31* 32*  CREATININE 1.53* 1.46* 1.43* 1.48* 1.47*  CALCIUM 10.4* 10.1 9.9 9.6 9.5  MG 1.9 1.8 1.7 2.5* 2.2    Liver Function Tests: No results for input(s): AST, ALT, ALKPHOS, BILITOT, PROT, ALBUMIN in the last 168 hours.  No results for input(s): LIPASE, AMYLASE in the last 168 hours. No results for input(s): AMMONIA in the last 168 hours.  CBC: Recent Labs  Lab 08/03/21 0152 08/04/21 0503 08/05/21 0515 08/06/21 0441 08/07/21 0346  WBC 16.0* 14.4* 13.9* 12.8* 13.4*  HGB 9.0* 9.3* 9.9* 10.1* 10.2*  HCT 27.8* 28.8* 30.7* 31.8* 31.3*  MCV 74.1* 73.8* 73.4* 73.8* 73.5*  PLT 354 356 328 361 335  INR: Recent Labs  Lab 08/03/21 0152 08/04/21 0503 08/05/21 0515 08/06/21 0441 08/07/21 0346  INR 1.9* 2.2* 2.1* 2.2* 2.2*    Other results:   Imaging   No results found.   Medications:     Scheduled Medications:  allopurinol  100 mg Oral Daily   amiodarone  200 mg Oral Daily   bupivacaine  20 mL Infiltration Once   chlorhexidine gluconate (MEDLINE KIT)  15 mL Mouth Rinse BID   Chlorhexidine Gluconate Cloth  6 each Topical Daily   colchicine  0.6 mg Oral Daily   docusate sodium  200 mg Oral Daily   gabapentin  200 mg Oral TID   insulin aspart  0-20 Units Subcutaneous TID WC   insulin aspart  0-5 Units Subcutaneous QHS   insulin aspart  6 Units Subcutaneous TID WC   insulin detemir  40 Units  Subcutaneous BID   isosorbide-hydrALAZINE  2 tablet Oral TID   mouth rinse  15 mL Mouth Rinse BID   pantoprazole  40 mg Oral Daily   polyethylene glycol  17 g Oral BID   predniSONE  40 mg Oral Q breakfast   sacubitril-valsartan  1 tablet Oral BID   senna  1 tablet Oral BID   sertraline  25 mg Oral Daily   sodium chloride flush  10-40 mL Intracatheter Q12H   sodium chloride flush  3 mL Intravenous Q12H   spironolactone  25 mg Oral Daily   traZODone  100 mg Oral QHS   warfarin  4 mg Oral q1600   Warfarin - Pharmacist Dosing Inpatient   Does not apply q1600    Infusions:  sodium chloride Stopped (07/29/21 1211)    PRN Medications: sodium chloride, acetaminophen, meclizine, ondansetron (ZOFRAN) IV, oxyCODONE, sodium chloride flush, sodium chloride flush, traMADol   Assessment/Plan:    1. Acute on Chronic systolic CHF - Nonischemic CM, end-stage - Echo (9/21): EF 20-25%.   - Echo  8/22: EF < 20%, severe LV dilation, moderate RV dysfunction.   - RHC (8/22): well compensated hemodynamics. - s/p S-ICD - Not transplant candidate with BMI - S/p HM3 LVAD 10/17 (DT) -- MAP 80-90s today. Volume status stable.  -Continue Bidil 2 tabs tid, spironolactone 25 mg daily and entresto 49-51 mg BID.   2. VAD  - s/p HM-3 placement 10/17 - LDH stable 172 - Off  diuretics.  - VAD interrogated personally. Parameters stable. - 10/26 Ramp echo with speed increased. Now on 6200  - INR 2.2 on warfarin.   - On neurontin for pocket pain.  - Continue LVAD education.   3 Atrial fibrillation: Paroxysmal - Had severe AF with RVR 7/22 s/p DC-CV x 2 - Seen by EP. Not a candidate for ablation due to body habitus  (could consider AVN ablation and CRT, if needed)  - Back in AF with RVR post VAD. Failed RAP x 3.  - 10/23 S/P DC-CV with restoration of NSR. - Continue po amio 200 mg daily.     - INR 2.2    4. CKD stage 3B  - Has solitary kidney.   - Baseline creatinine is 1.6-2.9.  - Creatinine 1.5   today.   5. OSA - Continue CPAP   6. DM2. Poorly controlled - On Jardiance. - Hgb A1c 9.4. - CBGs elevated w/ prednisone - SSI, increased 10/31   7. Hematuria - MRI and cystoscopy ok   8. Polyarticular gout. - severe, still with significant pain and has been unable to stand.   -  Stop prednisone.   - Continue colchicine daily and allopurinol.   - Consulted ortho => knee aspirated and steroid injected. Pain improving gradually. Mobility improving.   9. Headache/vertigo - CT head negative.  - Has history of vertigo with prior meclizine use.  - Now improved, continue meclizine prn  10. ICD Shock  Device turned back on 11/2. Interrogation negative for arrhythmias. Device adjusted. ? LVAD interference.   Discharge planning Will need to arrange for [facility] PT [facility] consulted waiting  for [facility] to accept.    I reviewed the LVAD parameters from today, and compared the results to the patient's prior recorded data.  No programming changes were made.  The LVAD is functioning within specified parameters.  The patient performs LVAD self-test daily.  LVAD interrogation was negative for any significant power changes, alarms or PI events/speed drops.  LVAD equipment check completed and is in good working order.  Back-up equipment present.   LVAD education done on emergency procedures and precautions and reviewed exit site care.  Length of Stay: [place], NP 08/07/2021, 7:55 AM  VAD Team --- VAD ISSUES ONLY--- Pager [phone] (7am - 7am)  Advanced Heart Failure Team  Pager [phone] (M-F; 7a - 5p)   Patient seen and examined with the above-signed Advanced Practice Provider and/or Housestaff. I personally reviewed laboratory data, imaging studies and relevant notes. I independently examined the patient and formulated the important aspects of the plan. I have edited the note to reflect any of my changes or salient points. I have personally discussed the plan with the patient and/or family.  Had  ICD shock yesterday for artifact. Very shook up. Denies CP, sob, orthopnea or PND. R knee improving. Able to walk with walker. LDH 172  INR 2.2  General:  NAD.  HEENT: normal  Neck: supple. JVP not elevated.  Carotids 2+ bilat; no bruits. No lymphadenopathy or thryomegaly appreciated. Cor: LVAD hum.  Lungs: Clear. Abdomen: obese soft, nontender, non-distended. No hepatosplenomegaly. No bruits or masses. Good bowel sounds. Driveline site clean. Anchor in place.  Extremities: no cyanosis, clubbing, rash. Warm no edema  Neuro: alert & oriented x 3. No focal deficits. Moves all 4 without problem   Continues to progress. Very concerned about ICD shock. We had a long talk about the fact that this was artifact and not VT and we have programmed device to vastly minimize chance of repeat shocks. INR 2.2 Discussed dosing with PharmD personally. Remains in NSR on po amio. VAD interrogated personally. Parameters stable.  Possible home tomorrow.   Total time spent 35 minutes. Over half that time spent discussing above.   Glori Bickers, MD  10:17 AM

## 2021-08-07 NOTE — Progress Notes (Signed)
CARDIAC REHAB PHASE I   PRE:  Rate/Rhythm: 81 SR    BP: sitting 80 dopplar    SaO2: 95 RA  MODE:  Ambulation: 410 ft   POST:  Rate/Rhythm: 100 ST    BP: sitting 100  dopplar    SaO2: 95 RA  Pt about asleep but willing to walk. Moved to EOB, donned batteries, stood, ambulated with RW, return to bed. Pt asked for assist with straightening vest, otherwise independent. Feels well.   Discussed IS, sternal precautions, walking at home, low sodium, and CRPII. Voiced understanding and requests his referral be sent to [facility]. He sts he quit smoking, no problems.  Kendrick, ACSM 08/07/2021 2:30 PM

## 2021-08-07 NOTE — Progress Notes (Signed)
Pt places himself on CPAP at night. RT will cont to monitor. Sat 93 on RA.

## 2021-08-07 NOTE — TOC CM/SW Note (Signed)
HF TOC CM referral sent to [facility] rep. Hoyle Sauer for review. Waiting confirmation. Hendrix, Heart Failure TOC CM [phone]

## 2021-08-07 NOTE — Plan of Care (Signed)
  Problem: Education: Goal: Knowledge of General Education information will improve Description: Including pain rating scale, medication(s)/side effects and non-pharmacologic comfort measures Outcome: Progressing   Problem: Health Behavior/Discharge Planning: Goal: Ability to manage health-related needs will improve Outcome: Progressing   Problem: Clinical Measurements: Goal: Ability to maintain clinical measurements within normal limits will improve Outcome: Progressing Goal: Will remain free from infection Outcome: Progressing Goal: Diagnostic test results will improve Outcome: Progressing Goal: Respiratory complications will improve Outcome: Progressing Goal: Cardiovascular complication will be avoided Outcome: Progressing   Problem: Activity: Goal: Risk for activity intolerance will decrease Outcome: Progressing   Problem: Nutrition: Goal: Adequate nutrition will be maintained Outcome: Progressing   Problem: Coping: Goal: Level of anxiety will decrease Outcome: Progressing   Problem: Elimination: Goal: Will not experience complications related to bowel motility Outcome: Progressing Goal: Will not experience complications related to urinary retention Outcome: Progressing   Problem: Pain Managment: Goal: General experience of comfort will improve Outcome: Progressing   Problem: Safety: Goal: Ability to remain free from injury will improve Outcome: Progressing   Problem: Skin Integrity: Goal: Risk for impaired skin integrity will decrease Outcome: Progressing   Problem: Education: Goal: Knowledge of the prescribed therapeutic regimen will improve Outcome: Progressing   Problem: Activity: Goal: Risk for activity intolerance will decrease Outcome: Progressing   Problem: Cardiac: Goal: Ability to maintain an adequate cardiac output will improve Outcome: Progressing   Problem: Coping: Goal: Level of anxiety will decrease Outcome: Progressing   Problem:  Fluid Volume: Goal: Risk for excess fluid volume will decrease Outcome: Progressing   Problem: Clinical Measurements: Goal: Ability to maintain clinical measurements within normal limits will improve Outcome: Progressing Goal: Will remain free from infection Outcome: Progressing   Problem: Respiratory: Goal: Will regain and/or maintain adequate ventilation Outcome: Progressing

## 2021-08-07 NOTE — Progress Notes (Signed)
Patient refused cpap tonight due to irritation of his nose from the nasal pillows.

## 2021-08-07 NOTE — Progress Notes (Signed)
[facility] for warfarin Indication:  LVAD  Allergies  Allergen Reactions   Aspirin Shortness Of Breath, Itching and Rash     Burning sensation (Patient reports he tolerates other NSAIDS)    Bee Venom Hives and Swelling   Lisinopril Cough   Shrimp (Diagnostic) Rash   Tomato Rash    Patient Measurements: Height: 5\' 7"  (170.2 cm) Weight: 131.7 kg (290 lb 5.5 oz) IBW/kg (Calculated) : 66.1  Vital Signs: Temp: 98.3 F (36.8 C) (11/03 0736) Temp Source: Oral (11/03 0736) BP: 101/83 (11/03 0736) Pulse Rate: 75 (11/03 0319)  Labs: Recent Labs    08/05/21 0515 08/06/21 0441 08/06/21 1412 08/07/21 0346  HGB 9.9* 10.1*  --  10.2*  HCT 30.7* 31.8*  --  31.3*  PLT 328 361  --  335  LABPROT 23.8* 24.1*  --  24.6*  INR 2.1* 2.2*  --  2.2*  CREATININE 1.46* 1.43* 1.48* 1.47*     Estimated Creatinine Clearance: 82 mL/min (A) (by C-G formula based on SCr of 1.47 mg/dL (H)).   Medical History: Past Medical History:  Diagnosis Date   Anxiety    Aspirin allergy    Childhood asthma    Chronic systolic CHF (congestive heart failure) ([place])    a. EF 20-25% in 2012. b. EF 45-50% in 10/2011 with nonischemic nuc - presumed NICM. c. 12/2014 Echo: Sev depressed LV fxn, sev dil LV, mild LVH, mild MR, sev dil LA, mildly reduced RV fxn.   CKD (chronic kidney disease) stage 2, GFR 60-89 ml/min    H/O vasectomy 12/2019   High cholesterol    Hypertension    Morbid obesity ([place])    Nephrolithiasis    OSA on CPAP    Paroxysmal atrial fibrillation ([place])    Presumed NICM    a. 04/2014 Myoview: EF 26%, glob HK, sev glob HK, ? prior infarct;  b. Never cathed 2/2 CKD.   Renal cell carcinoma ([place])    a. s/p Rt robotic assisted partial converted to radical nephrectomy on 01/2013.   Troponin level elevated    a. 04/2014, 12/2014: felt due to CHF.   Type II diabetes mellitus ([facility])    Ventricular tachycardia    a. appropriate ICD therapy 12/2017    Assessment: Patient is status post LVAD placement on 10/17. Warfarin was initiated on 10/18.    S/p DCCV 10/23.  INR stable at 2.2, will continue current dosing.  No s/sx of bleeding or infusion issues.   Goal of Therapy:  INR 2-2.5 Monitor platelets by anticoagulation protocol: Yes   Plan:  Continue Warfarin 4 mg daily  Monitor daily Protime, CBC, and for s/sx of bleed  Erin Hearing PharmD., BCPS Clinical Pharmacist 08/07/2021 8:16 AM

## 2021-08-07 NOTE — Progress Notes (Signed)
Pre VAD:  [facility] Cardiomyopathy Questionnaire  KCCQ-12        07/18/21   1 a. Ability to shower/bathe Quite a bit limited   1 b. Ability to walk 1 block Extremely limited   1 c. Ability to hurry/jog Extremely limited   2. Edema feet/ankles/legs Never over last 2 weeks   3. Limited by fatigue At least once a day   4. Limited by dyspnea At least once a day   5. Sitting up / on 3+ pillows Never over last 2 weeks   6. Limited enjoyment of life It has slightly limited   7. Rest of life w/ symptoms Somewhat satisfied   8 a. Participation in hobbies Does not apply   8 b. Participation in chores Severely limited   8 c. Visiting family/friends Moderately limited    Zada Girt RN, [place] Coordinator [phone]

## 2021-08-07 NOTE — Progress Notes (Signed)
Physical Therapy Treatment Patient Details Name: Jeffrey [name] MRN: [id number] DOB: 1976-07-25 Today's Date: 08/07/2021   History of Present Illness 45 y.o. male with class IV HF admitted 07/18/21 for pre-VAD optimization; has been relatively stable as outpatient with NYHA 3B symptoms. S/p RHC with swan placement 10/14. S/p HM3 LVAD implant on 10/17. Hypoxia overnight 10/19 requiring bipap. 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 Pt underwent cardioversion 10/23. ICD shocked 11/2; device interrogated, no arrhythmias. PMH includes poorly controlled HTN, renal cell carcinoma s/p nephrectomy, CKD3, DM2, OSA, gout, morbid obesity, HF (EF<20%).   PT Comments    Pt progressing well with mobility, preparing for d/c home tomorrow. Today's session focused on ambulation for improving activity tolerance; pt mod indep with RW, declines gait training without DME, also declines stair training. Pt indep with transition of LVAD from wall<>battery power, with good awareness of lines. Reviewed educ re: precautions, postioning, activity recommendations; pt reports no further questions or concerns. If to remain admitted, will continue to follow acutely.    Recommendations for follow up therapy are one component of a multi-disciplinary discharge planning process, led by the attending physician.  Recommendations may be updated based on patient status, additional functional criteria and insurance authorization.  Follow Up Recommendations  Home health PT     Assistance Recommended at Discharge PRN  Equipment Recommendations  None recommended by PT    Recommendations for Other Services       Precautions / Restrictions Precautions Precautions: Fall;Sternal;Other (comment) Precaution Comments: LVAD     Mobility  Bed Mobility Overal bed mobility: Independent Bed Mobility: Supine to Sit;Sit to Supine           General bed mobility comments: Indep with bed flat    Transfers Overall transfer level:  Independent Equipment used: None                    Ambulation/Gait Ambulation/Gait assistance: Modified independent (Device/Increase time) Gait Distance (Feet): 400 Feet Assistive device: Rolling walker (2 wheels) Gait Pattern/deviations: Step-through pattern;Wide base of support;Antalgic Gait velocity: Decreased   General Gait Details: Slow, steady gait mod indep with RW; pt declined ambulation without DME. Endorses bilateral knee pain, although improved from prior sessions. DOE 2-3/4, conversant throughout ambulation; 2x brief standing rest breaks, good activity pacing   Stairs Stairs:  (pt declined stair training, reports confident on steps into home, also has access with ramp)           Wheelchair Mobility    Modified Rankin (Stroke Patients Only)       Balance Overall balance assessment: Modified Independent Sitting-balance support: No upper extremity supported;Feet supported Sitting balance-Leahy Scale: Good     Standing balance support: No upper extremity supported;During functional activity Standing balance-Leahy Scale: Fair                              Cognition Arousal/Alertness: Awake/alert Behavior During Therapy: WFL for tasks assessed/performed Overall Cognitive Status: Within Functional Limits for tasks assessed                                          Exercises      General Comments General comments (skin integrity, edema, etc.): HR 100s with activity. Pt indep with transition of LVAD from wall<>battery power, good awareness of lines. pt preparing for d/c home tomorrow -  reviewed educ re: precautions, activity recommendations.      Pertinent Vitals/Pain Pain Assessment: Faces Faces Pain Scale: Hurts a little bit Pain Location: knees, pocket pain Pain Descriptors / Indicators: Discomfort Pain Intervention(s): Premedicated before session    Home Living                          Prior Function             PT Goals (current goals can now be found in the care plan section) Acute Rehab PT Goals Patient Stated Goal: home tomorrow PT Goal Formulation: With patient Time For Goal Achievement: 08/21/21 Potential to Achieve Goals: Good Progress towards PT goals: Progressing toward goals    Frequency    Min 3X/week      PT Plan Current plan remains appropriate    Co-evaluation              AM-PAC PT "6 Clicks" Mobility   Outcome Measure  Help needed turning from your back to your side while in a flat bed without using bedrails?: None Help needed moving from lying on your back to sitting on the side of a flat bed without using bedrails?: None Help needed moving to and from a bed to a chair (including a wheelchair)?: None Help needed standing up from a chair using your arms (e.g., wheelchair or bedside chair)?: None Help needed to walk in hospital room?: None Help needed climbing 3-5 steps with a railing? : A Little 6 Click Score: 23    End of Session Equipment Utilized During Treatment: Gait belt Activity Tolerance: Patient tolerated treatment well Patient left: with call Marker/phone within reach;in bed Nurse Communication: Mobility status PT Visit Diagnosis: Other abnormalities of gait and mobility (R26.89);Difficulty in walking, not elsewhere classified (R26.2);Pain Pain - Right/Left: Right Pain - part of body: Knee     Time: 2549-8264 PT Time Calculation (min) (ACUTE ONLY): 19 min  Charges:  $Therapeutic Exercise: 8-22 mins                     Jeffrey [name], PT, DPT Acute Rehabilitation Services  Pager [phone] Office [phone]  Derry Lory 08/07/2021, 11:12 AM

## 2021-08-08 ENCOUNTER — Other Ambulatory Visit (HOSPITAL_COMMUNITY): Payer: Self-pay

## 2021-08-08 ENCOUNTER — Other Ambulatory Visit: Payer: Self-pay | Admitting: Obstetrics and Gynecology

## 2021-08-08 DIAGNOSIS — Z95811 Presence of heart assist device: Secondary | ICD-10-CM | POA: Diagnosis not present

## 2021-08-08 DIAGNOSIS — R570 Cardiogenic shock: Secondary | ICD-10-CM | POA: Diagnosis not present

## 2021-08-08 DIAGNOSIS — I5023 Acute on chronic systolic (congestive) heart failure: Secondary | ICD-10-CM | POA: Diagnosis not present

## 2021-08-08 DIAGNOSIS — I4891 Unspecified atrial fibrillation: Secondary | ICD-10-CM | POA: Diagnosis not present

## 2021-08-08 LAB — GLUCOSE, CAPILLARY
Glucose-Capillary: 79 mg/dL (ref 70–99)
Glucose-Capillary: 188 mg/dL — ABNORMAL HIGH (ref 70–99)
Glucose-Capillary: 134 mg/dL — ABNORMAL HIGH (ref 70–99)

## 2021-08-08 LAB — CBC
HCT: 33.2 % — ABNORMAL LOW (ref 39.0–52.0)
Hemoglobin: 10.6 g/dL — ABNORMAL LOW (ref 13.0–17.0)
MCH: 23.7 pg — ABNORMAL LOW (ref 26.0–34.0)
MCHC: 31.9 g/dL (ref 30.0–36.0)
MCV: 74.3 fL — ABNORMAL LOW (ref 80.0–100.0)
Platelets: 300 10*3/uL (ref 150–400)
RBC: 4.47 MIL/uL (ref 4.22–5.81)
RDW: 18 % — ABNORMAL HIGH (ref 11.5–15.5)
WBC: 9.6 10*3/uL (ref 4.0–10.5)
nRBC: 0 % (ref 0.0–0.2)

## 2021-08-08 LAB — BASIC METABOLIC PANEL
Anion gap: 4 — ABNORMAL LOW (ref 5–15)
BUN: 32 mg/dL — ABNORMAL HIGH (ref 6–20)
CO2: 22 mmol/L (ref 22–32)
Calcium: 9.2 mg/dL (ref 8.9–10.3)
Chloride: 109 mmol/L (ref 98–111)
Creatinine, Ser: 1.41 mg/dL — ABNORMAL HIGH (ref 0.61–1.24)
GFR, Estimated: >60 mL/min (ref >60)
Glucose, Bld: 82 mg/dL (ref 70–99)
Potassium: 4.1 mmol/L (ref 3.5–5.1)
Sodium: 135 mmol/L (ref 135–145)

## 2021-08-08 LAB — MAGNESIUM: Magnesium: 1.9 mg/dL (ref 1.7–2.4)

## 2021-08-08 LAB — PROTIME-INR
INR: 2.2 — ABNORMAL HIGH (ref 0.8–1.2)
Prothrombin Time: 24.2 seconds — ABNORMAL HIGH (ref 11.4–15.2)

## 2021-08-08 LAB — LACTATE DEHYDROGENASE: LDH: 166 U/L (ref 98–192)

## 2021-08-08 MED ORDER — GABAPENTIN 100 MG PO CAPS
200.0000 mg | ORAL_CAPSULE | Freq: Three times a day (TID) | ORAL | 6 refills | Status: DC
  Filled 2021-08-08: qty 180, 30d supply, fill #0

## 2021-08-08 MED ORDER — WARFARIN SODIUM 4 MG PO TABS
4.0000 mg | ORAL_TABLET | Freq: Every day | ORAL | 6 refills | Status: DC
  Filled 2021-08-08: qty 45, 45d supply, fill #0

## 2021-08-08 MED ORDER — ISOSORB DINITRATE-HYDRALAZINE 20-37.5 MG PO TABS
2.0000 | ORAL_TABLET | Freq: Three times a day (TID) | ORAL | 6 refills | Status: DC
  Filled 2021-08-08: qty 180, 30d supply, fill #0
  Filled 2021-09-05: qty 180, 30d supply, fill #1
  Filled 2021-10-06: qty 180, 30d supply, fill #2

## 2021-08-08 MED ORDER — COLCHICINE 0.6 MG PO TABS
0.6000 mg | ORAL_TABLET | Freq: Every day | ORAL | 6 refills | Status: DC
  Filled 2021-08-08: qty 30, 30d supply, fill #0
  Filled 2021-09-02: qty 30, 30d supply, fill #1
  Filled 2021-09-25 – 2021-10-06 (×2): qty 30, 30d supply, fill #2
  Filled 2021-11-03: qty 30, 30d supply, fill #3
  Filled 2021-12-03 – 2021-12-15 (×2): qty 30, 30d supply, fill #4
  Filled 2022-01-26: qty 30, 30d supply, fill #5
  Filled 2022-03-03: qty 30, 30d supply, fill #6

## 2021-08-08 MED ORDER — SACUBITRIL-VALSARTAN 49-51 MG PO TABS
1.0000 | ORAL_TABLET | Freq: Two times a day (BID) | ORAL | 6 refills | Status: DC
  Filled 2021-08-08: qty 60, 30d supply, fill #0
  Filled 2021-09-05: qty 60, 30d supply, fill #1
  Filled 2021-10-06: qty 60, 30d supply, fill #2
  Filled 2021-11-27: qty 60, 30d supply, fill #3
  Filled 2022-01-20: qty 60, 30d supply, fill #4

## 2021-08-08 MED ORDER — AMIODARONE HCL 200 MG PO TABS
200.0000 mg | ORAL_TABLET | Freq: Every day | ORAL | 6 refills | Status: DC
  Filled 2021-08-08: qty 30, 30d supply, fill #0
  Filled 2021-09-12 – 2021-09-25 (×2): qty 30, 30d supply, fill #1
  Filled 2021-09-30: qty 30, 30d supply, fill #0

## 2021-08-08 MED ORDER — TRAMADOL HCL 50 MG PO TABS
50.0000 mg | ORAL_TABLET | ORAL | 0 refills | Status: DC | PRN
  Filled 2021-08-08: qty 30, 3d supply, fill #0

## 2021-08-08 MED ORDER — MECLIZINE HCL 25 MG PO TABS
25.0000 mg | ORAL_TABLET | Freq: Three times a day (TID) | ORAL | 0 refills | Status: DC | PRN
  Filled 2021-08-08: qty 30, 10d supply, fill #0

## 2021-08-08 MED ORDER — OXYCODONE HCL 5 MG PO TABS
5.0000 mg | ORAL_TABLET | Freq: Three times a day (TID) | ORAL | 0 refills | Status: DC | PRN
Start: 2021-08-08 — End: 2021-08-27
  Filled 2021-08-08: qty 15, 5d supply, fill #0

## 2021-08-08 NOTE — Plan of Care (Signed)
  Problem: Education: Goal: Knowledge of General Education information will improve Description: Including pain rating scale, medication(s)/side effects and non-pharmacologic comfort measures Outcome: Adequate for Discharge   Problem: Health Behavior/Discharge Planning: Goal: Ability to manage health-related needs will improve Outcome: Adequate for Discharge   Problem: Clinical Measurements: Goal: Ability to maintain clinical measurements within normal limits will improve Outcome: Adequate for Discharge Goal: Will remain free from infection Outcome: Adequate for Discharge Goal: Diagnostic test results will improve Outcome: Adequate for Discharge Goal: Respiratory complications will improve Outcome: Adequate for Discharge Goal: Cardiovascular complication will be avoided Outcome: Adequate for Discharge   Problem: Activity: Goal: Risk for activity intolerance will decrease Outcome: Adequate for Discharge   Problem: Nutrition: Goal: Adequate nutrition will be maintained Outcome: Adequate for Discharge   Problem: Coping: Goal: Level of anxiety will decrease Outcome: Adequate for Discharge   Problem: Elimination: Goal: Will not experience complications related to bowel motility Outcome: Adequate for Discharge Goal: Will not experience complications related to urinary retention Outcome: Adequate for Discharge   Problem: Pain Managment: Goal: General experience of comfort will improve Outcome: Adequate for Discharge   Problem: Safety: Goal: Ability to remain free from injury will improve Outcome: Adequate for Discharge   Problem: Skin Integrity: Goal: Risk for impaired skin integrity will decrease Outcome: Adequate for Discharge   Problem: Education: Goal: Knowledge of the prescribed therapeutic regimen will improve Outcome: Adequate for Discharge   Problem: Activity: Goal: Risk for activity intolerance will decrease Outcome: Adequate for Discharge   Problem:  Cardiac: Goal: Ability to maintain an adequate cardiac output will improve Outcome: Adequate for Discharge   Problem: Coping: Goal: Level of anxiety will decrease Outcome: Adequate for Discharge   Problem: Fluid Volume: Goal: Risk for excess fluid volume will decrease Outcome: Adequate for Discharge   Problem: Clinical Measurements: Goal: Ability to maintain clinical measurements within normal limits will improve Outcome: Adequate for Discharge Goal: Will remain free from infection Outcome: Adequate for Discharge   Problem: Respiratory: Goal: Will regain and/or maintain adequate ventilation Outcome: Adequate for Discharge

## 2021-08-08 NOTE — Progress Notes (Signed)
Discharge instructions (including medications) discussed with and copy provided to patient/caregiver 

## 2021-08-08 NOTE — Plan of Care (Signed)
  Problem: Acute Rehab PT Goals(only PT should resolve) Goal: Pt Will Ambulate Outcome: Adequate for Discharge Goal: Pt Will Go Up/Down Stairs Outcome: Adequate for Discharge

## 2021-08-08 NOTE — Progress Notes (Addendum)
Patient ID: Jeffrey [name], male   DOB: 1976-03-27, 45 y.o.   MRN: [id number]   Advanced Heart Failure VAD Team Note  PCP-Cardiologist: Glori Bickers, MD   Subjective:    10/17: HM3 LVAD implant (DT)  10/18: Extubated 10/23: DC-CV for A fib RVR--> NSR. Milrinone cut back to 0.125 mcg.   10/25: Bidil increased to 2 tabs tid.  10/26: Lasix and milrinone stopped. Had ramp echo with speed increased to 6100 10/27: VAD speed increased to 6200  10/28: Right knee aspirated, steroid injection.  10/30: Head CT for dizziness was negative  08/06/21: ICD turned on. Got shocked. Device interrogated with adjustments. No arrhythmias.    Creatinine stable at 1.4 INR 2.2   MAPs 80-90  Wants to go home.   LVAD INTERROGATION:  HeartMate III LVAD:   Flow 5.1 liters /min, speed 6200 power 5.0 PI 2.7     Objective:    Vital Signs:   Temp:  [97.8 F (36.6 C)-98.3 F (36.8 C)] 98.3 F (36.8 C) (11/04 0737) Pulse Rate:  [71-84] 84 (11/04 0737) Resp:  [15-27] 18 (11/04 0737) BP: (91-114)/(65-92) 109/84 (11/04 0737) SpO2:  [91 %-97 %] 91 % (11/04 0737) Weight:  [131.5 kg] 131.5 kg (11/04 0645) Last BM Date: 08/07/21 Mean arterial Pressure 80-90s     Intake/Output:   Intake/Output Summary (Last 24 hours) at 08/08/2021 0805 Last data filed at 08/08/2021 0726 Gross per 24 hour  Intake 760 ml  Output 525 ml  Net 235 ml     Physical Exam   Physical Exam: GENERAL: No acute distress. HEENT: normal  NECK: Supple, JVP  5-6 .  2+ bilaterally, no bruits.  No lymphadenopathy or thyromegaly appreciated.   CARDIAC:  Mechanical heart sounds with LVAD hum present.  LUNGS:  Clear to auscultation bilaterally.  ABDOMEN:  Soft, round, nontender, positive bowel sounds x4.     LVAD exit site:  Dressing dry and intact.  No erythema or drainage.  Stabilization device present and accurately applied.  Driveline dressing is being changed daily per sterile technique. EXTREMITIES:  Warm and dry, no cyanosis,  clubbing, rash or edema. RUE PICC   NEUROLOGIC:  Alert and oriented x 3.    No aphasia.  No dysarthria.  Affect pleasant.     Telemetry   NSR 80-90s personally checked.   Labs   Basic Metabolic Panel: Recent Labs  Lab 08/05/21 0515 08/06/21 0441 08/06/21 1412 08/07/21 0346 08/08/21 0343  NA 136 136 132* 136 135  K 4.0 3.9 4.6 4.2 4.1  CL 105 106 105 108 109  CO2 26 23 20* 24 22  GLUCOSE 224* 172* 274* 128* 82  BUN 33* 30* 31* 32* 32*  CREATININE 1.46* 1.43* 1.48* 1.47* 1.41*  CALCIUM 10.1 9.9 9.6 9.5 9.2  MG 1.8 1.7 2.5* 2.2 1.9    Liver Function Tests: No results for input(s): AST, ALT, ALKPHOS, BILITOT, PROT, ALBUMIN in the last 168 hours.  No results for input(s): LIPASE, AMYLASE in the last 168 hours. No results for input(s): AMMONIA in the last 168 hours.  CBC: Recent Labs  Lab 08/04/21 0503 08/05/21 0515 08/06/21 0441 08/07/21 0346 08/08/21 0343  WBC 14.4* 13.9* 12.8* 13.4* 9.6  HGB 9.3* 9.9* 10.1* 10.2* 10.6*  HCT 28.8* 30.7* 31.8* 31.3* 33.2*  MCV 73.8* 73.4* 73.8* 73.5* 74.3*  PLT 356 328 361 335 300    INR: Recent Labs  Lab 08/04/21 0503 08/05/21 0515 08/06/21 0441 08/07/21 0346 08/08/21 0343  INR 2.2* 2.1*  2.2* 2.2* 2.2*    Other results:   Imaging   No results found.   Medications:     Scheduled Medications:  allopurinol  100 mg Oral Daily   amiodarone  200 mg Oral Daily   Chlorhexidine Gluconate Cloth  6 each Topical Daily   colchicine  0.6 mg Oral Daily   docusate sodium  200 mg Oral Daily   gabapentin  200 mg Oral TID   insulin aspart  0-20 Units Subcutaneous TID WC   insulin aspart  0-5 Units Subcutaneous QHS   insulin aspart  6 Units Subcutaneous TID WC   insulin detemir  40 Units Subcutaneous BID   isosorbide-hydrALAZINE  2 tablet Oral TID   pantoprazole  40 mg Oral Daily   polyethylene glycol  17 g Oral BID   sacubitril-valsartan  1 tablet Oral BID   senna  1 tablet Oral BID   sertraline  25 mg Oral Daily    sodium chloride flush  10-40 mL Intracatheter Q12H   sodium chloride flush  3 mL Intravenous Q12H   spironolactone  25 mg Oral Daily   traZODone  100 mg Oral QHS   warfarin  4 mg Oral q1600   Warfarin - Pharmacist Dosing Inpatient   Does not apply q1600    Infusions:  sodium chloride Stopped (07/29/21 1211)    PRN Medications: sodium chloride, acetaminophen, meclizine, ondansetron (ZOFRAN) IV, oxyCODONE, sodium chloride flush, sodium chloride flush, traMADol   Assessment/Plan:    1. Acute on Chronic systolic CHF - Nonischemic CM, end-stage - Echo (9/21): EF 20-25%.   - Echo  8/22: EF < 20%, severe LV dilation, moderate RV dysfunction.   - RHC (8/22): well compensated hemodynamics. - s/p S-ICD - Not transplant candidate with BMI - S/p HM3 LVAD 10/17 (DT) - Volume status stable.  -- MAP 90s -Continue Bidil 2 tabs tid, spironolactone 25 mg daily - Continue entresto 49-51 mg BID.   2. VAD  - s/p HM-3 placement 10/17 - LDH stable 166  - Off  diuretics.  - VAD interrogated personally. Parameters stable. - 10/26 Ramp echo with speed increased. Now on 6200  - INR 2.2 on warfarin.  Stable  - On neurontin for pocket pain.  - Continue LVAD education.   3 Atrial fibrillation: Paroxysmal - Had severe AF with RVR 7/22 s/p DC-CV x 2 - Seen by EP. Not a candidate for ablation due to body habitus  (could consider AVN ablation and CRT, if needed)  - Back in AF with RVR post VAD. Failed RAP x 3.  - 10/23 S/P DC-CV with restoration of NSR. - Continue po amio 200 mg daily.     - INR 2.2    4. CKD stage 3B  - Has solitary kidney.   - Baseline creatinine is 1.6-2.9.  - Creatinine 1.4 today.   5. OSA - Continue CPAP   6. DM2. Poorly controlled - On Jardiance. - Hgb A1c 9.4. - CBGs elevated w/ prednisone - SSI, increased 10/31   7. Hematuria - MRI and cystoscopy ok   8. Polyarticular gout. - severe, still with significant pain and has been unable to stand.   - Stop  prednisone.   - Continue colchicine daily and allopurinol.   - Consulted ortho => knee aspirated and steroid injected.  - Mobility improving.    9. Headache/vertigo - CT head negative.  - Has history of vertigo with prior meclizine use.  - Now improved, continue meclizine prn  10. ICD  Shock  Device turned back on 11/2. Interrogation negative for arrhythmias. Device adjusted. ? LVAD interference.   Discharge planning Will need to [facility] consulted waiting  for [facility] to accept.    I reviewed the LVAD parameters from today, and compared the results to the patient's prior recorded data.  No programming changes were made.  The LVAD is functioning within specified parameters.  The patient performs LVAD self-test daily.  LVAD interrogation was negative for any significant power changes, alarms or PI events/speed drops.  LVAD equipment check completed and is in good working order.  Back-up equipment present.   LVAD education done on emergency procedures and precautions and reviewed exit site care.  Length of Stay: [place], NP 08/08/2021, 8:05 AM  VAD Team --- VAD ISSUES ONLY--- Pager [phone] (7am - 7am)  Advanced Heart Failure Team  Pager [phone] (M-F; 7a - 5p)   Patient seen and examined with the above-signed Advanced Practice Provider and/or Housestaff. I personally reviewed laboratory data, imaging studies and relevant notes. I independently examined the patient and formulated the important aspects of the plan. I have edited the note to reflect any of my changes or salient points. I have personally discussed the plan with the patient and/or family.  Doing well. Knee pain improved. No further ICD shocks. INR 2.2  General:  NAD.  HEENT: normal  Neck: supple. JVP not elevated.  Carotids 2+ bilat; no bruits. No lymphadenopathy or thryomegaly appreciated. Cor: LVAD hum.  Lungs: Clear. Abdomen: obese soft, nontender, non-distended. No hepatosplenomegaly. No bruits or  masses. Good bowel sounds. Driveline site clean. Anchor in place.  Extremities: no cyanosis, clubbing, rash. Warm no edema  Neuro: alert & oriented x 3. No focal deficits. Moves all 4 without problem   Looks good. Stable for d/c today. Will see in VAD clinic next week.   Glori Bickers, MD  12:32 PM

## 2021-08-08 NOTE — TOC Initial Note (Signed)
Transition of Care [facility]) - Initial/Assessment Note    Patient Details  Name: Jeffrey [name] MRN: [id number] Date of Birth: Nov 01, 1975  [facility]) CM/SW Contact:    Erenest Rasher, RN Phone Number: [phone] 08/08/2021, 10:19 AM  Clinical Narrative:                 HF TOC CM contacted [place] to make aware of dc home today with [facility] PT. [place] for 3n1 bedside commode to be delivered to room prior to dc. Has RW, cane and shower chair. Wife and mother will be in home to assist with care.   Expected Discharge Plan: [place] Barriers to Discharge: Continued Medical Work up   Patient Goals and CMS Choice Patient states their goals for this hospitalization and ongoing recovery are:: would like to go home CMS Medicare.gov Compare Post Acute Care list provided to:: Patient Choice offered to / list presented to : Patient  Expected Discharge Plan and Services Expected Discharge Plan: [place] In-house Referral: Clinical Social Work Discharge Planning Services: CM Consult Post Acute Care Choice: [place] arrangements for the past 2 months: [place] Expected Discharge Date: 08/08/21                 DME Agency: [employer] Date DME Agency Contacted: 08/08/21 Time DME Agency Contacted: [id number] Representative spoke with at DME Agency: Freda Munro HH Arranged: PT ([place])   Date [place]: 08/08/21 Time [place]: 23 Representative spoke with at [place]: [place] Arrangements/Services Living arrangements for the past 2 months: [place] with:: Spouse, Minor Children Patient language and need for interpreter reviewed:: Yes Do you feel safe going back to the place where you live?: Yes      Need for Family Participation in Patient Care: Yes (Comment) Care giver support system in place?: Yes (comment) Current home services:  (rolling  walker, shower chair) Criminal Activity/Legal Involvement Pertinent to Current Situation/Hospitalization: No - Comment as needed  Activities of Daily Living Home Assistive Devices/Equipment: Walker (specify type) ADL Screening (condition at time of admission) Patient's cognitive ability adequate to safely complete daily activities?: Yes Is the patient deaf or have difficulty hearing?: No Does the patient have difficulty seeing, even when wearing glasses/contacts?: No Does the patient have difficulty concentrating, remembering, or making decisions?: No Patient able to express need for assistance with ADLs?: Yes Does the patient have difficulty dressing or bathing?: No Independently performs ADLs?: Yes (appropriate for developmental age) Does the patient have difficulty walking or climbing stairs?: No Weakness of Legs: None Weakness of Arms/Hands: None  Permission Sought/Granted Permission sought to share information with : Case Manager, Family Supports, PCP Permission granted to share information with : Yes, Verbal Permission Granted  Share Information with NAME: Eduardo Osier  Permission granted to share info w AGENCY: [place] granted to share info w Relationship: wife  Permission granted to share info w Contact Information: [phone]  Emotional Assessment Appearance:: Appears stated age Attitude/Demeanor/Rapport: Gracious, Engaged Affect (typically observed): Accepting Orientation: : Oriented to Self, Oriented to Place, Oriented to  Time, Oriented to Situation   Psych Involvement: No (comment)  Admission diagnosis:  Acute on chronic systolic CHF (congestive heart failure) (HCC) [I50.23] Patient Active Problem List   Diagnosis Date Noted   Presence of left ventricular assist device (LVAD) ([place]) 07/25/2021   Acute on chronic  systolic CHF (congestive heart failure) ([place]) 07/18/2021   Cardiogenic shock (HCC)    Atrial fibrillation with RVR ([place]) 04/30/2021    Acute hypoxemic respiratory failure (HCC)    Right wrist pain 04/16/2021   Absolute anemia    Rectal bleeding    Iron deficiency 12/25/2020   Long term current use of anticoagulant - apixaban 12/25/2020   Paroxysmal atrial fibrillation (HCC)    Lactic acidosis 11/22/2020   Hypotension 11/22/2020   Aspiration into airway    ICD (implantable cardioverter-defibrillator) battery depletion 05/15/2020   Endotracheal tube present    Respiratory failure ([place])    Acute encephalopathy    Acute pulmonary edema (HCC)    NICM (nonischemic cardiomyopathy) ([place]) 04/24/2020   S/P vasectomy 12/20/2019   Anxiety 12/20/2019   Hemoglobin A1C between 7% and 9% indicating borderline diabetic control ([place]) 12/20/2019   Seasonal allergies 12/20/2019   Insomnia 12/20/2019   Vasectomy evaluation 06/20/2019   Visual problems 06/20/2019   Blurry vision, bilateral 06/20/2019   Hyperglycemia 02/13/2019   Hyperosmolar (nonketotic) coma ([place]) 01/19/2019   AICD (automatic cardioverter/defibrillator) present 01/19/2019   Hyperlipidemia 12/07/2018   Follow-up exam 11/22/2018   Class 3 severe obesity due to excess calories with serious comorbidity and body mass index (BMI) of 45.0 to 49.9 in adult ([place]) 11/22/2018   Dizziness 11/22/2018   Lingular pneumonia 08/04/2018   Ventricular tachycardia 12/30/2017   PAF (paroxysmal atrial fibrillation) ([place])    Dilated cardiomyopathy ([place])    Pollen allergy 02/17/2017   Dyslipidemia 02/17/2017   Hematochezia 11/08/2015   CKD (chronic kidney disease), stage III ([place]) 06/20/2015   Cardiac defibrillator in situ    Chronic systolic CHF (congestive heart failure) ([place]) 03/11/2015   Gouty arthritis 03/11/2015   Chest pain 12/28/2014   Acute renal failure superimposed on stage 3 chronic kidney disease ([place]) 04/08/2014   Aspirin allergy 04/08/2014   Renal cell carcinoma ([place])    Gout attack 11/10/2011   Morbid obesity ([place]) 10/23/2011   OSA (obstructive sleep apnea)  10/23/2011   Type 2 diabetes mellitus with stage 3 chronic kidney disease, with long-term current use of insulin ([place]) 10/22/2011   Hyperlipidemia associated with type 2 diabetes mellitus ([place]) 12/25/2010   Hypertension associated with diabetes ([place]) 12/25/2010   PCP:  Bo Merino I, NP Pharmacy:   [facility] [place] [address] [place] [address] Phone: [phone] Fax: [phone]  [id number] [place], [place] [address] Phone: [phone] Fax: [phone]  [id number] [place], [place] [address]  [place] [address] Phone: [phone] Fax: [phone]  [facility] [address] [place] [address] Phone: [phone] Fax: [phone]     Social Determinants of Health (SDOH) Interventions Depression Interventions/Treatment : Counseling  Readmission Risk Interventions No flowsheet data found.

## 2021-08-08 NOTE — Progress Notes (Signed)
LVAD Coordinator Rounding Note:   Admitted 07/18/21 due to Dr. [name] for elective VAD placement.    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 Cyndia Bent under Destination Therapy criteria due to BMI and chronic kidney disease.    Pt lying in bed, dozing. Arouses easily. Denies complaints today.   Planned discharge home today, patient is "ready to go". He has discharge meds in bag at bedside at bedside, except warfarin. Jeneen Rinks, NP and Erin Hearing, PharmD notified. Medication will be delivered to patient prior to discharge per [name].  Discharge equipment delivered to room:  1. Two system controllers. 2. Mobile Power Unit (MPU) with 20' patient cable 3. One universal Charity fundraiser (UBC) 4. Six fully charged batteries (loaners)  5. Four battery clips (loaners) 6. One travel case 7. One battery holster 8. 1 Consolidated bag  8. Wearable accessory package 9. 14 Daily dressing kits, anchors, Aquacel (silver dressing)  Patient confirmed he has VAD pager programmed in his phone and is able to page if any issues/problems arise. He has f/u in [facility] scheduled next Wednesday at 9:00 am.   Vital signs: Temp:  98.3 HR: 84 NSR Doppler:  96 Auto cuff:  109/84 (94) O2 Sat: 95% on RA Wt:  277>292>285>296.3...>297.6>294.1>295.6>293.2>295.8>289.4>290>289.1 lbs   LVAD interrogation reveals:  Speed: 6200 Flow: 5.3 Power:  5.0 w PI: 2.4 Hct: 31   Alarms:  none Events:  10 PI events on 08/06/21   Fixed speed:  6200 Low speed limit: 5900   Drive Line:  Existing VAD dressing removed and site care performed using sterile technique. Drive line exit site cleaned with Chlora prep applicators x 2, allowed to dry, and gauze dressing with Silver strip applied. Exit site healing and unincorporated, the velour is fully implanted at exit site. Moderate amount of fatty necrosis drainge present. No redness, tenderness, foul odor or rash noted. Drive line anchor replaced x  2. Advance back to  daily dressing changes when he goes home based on increased amount drainage. Pt verbalized understanding of same.    Labs:  LDH trend: 318>372>312>302>243>225>211>214>186>176>171>170>172>166   INR trend: 1.3>1.9>1.9>2.0>2.3>1.9>1.7>1.5>1.3>2.2>2.1>2.2>2.2>2.2   Anticoagulation Plan: -INR Goal: 2.0 - 2.5  -ASA Dose: none due to allergy     Blood Products:  - 07/21/21 2 units FFP (intra op)   Device: - [employer] single ICD -Therapies: on per BS rep on 08/06/21   Respiratory: Extubated 07/22/21   Nitric Oxide:  Off 07/22/21   Infection:  - sputum culture 07/23/21>>> specimen rejected - urine culture 07/23/21>>>no growth (final) - blood culture 07/23/21>>>no growth final     Plan/Recommendations:  Call VAD Coordinator if any VAD equipment or drive line issues. Advance to daily dressing changes on discharge home today.      Zada Girt RN [place] Coordinator  Office: [phone]  24/7 Pager: [phone]

## 2021-08-08 NOTE — Progress Notes (Signed)
[place] for warfarin Indication:  LVAD  Allergies  Allergen Reactions   Aspirin Shortness Of Breath, Itching and Rash     Burning sensation (Patient reports he tolerates other NSAIDS)    Bee Venom Hives and Swelling   Lisinopril Cough   Shrimp (Diagnostic) Rash   Tomato Rash    Patient Measurements: Height: 5\' 7"  (170.2 cm) Weight: 131.5 kg (289 lb 14.5 oz) IBW/kg (Calculated) : 66.1  Vital Signs: Temp: 98.3 F (36.8 C) (11/04 0737) Temp Source: Oral (11/04 0302) BP: 109/84 (11/04 0737) Pulse Rate: 84 (11/04 0737)  Labs: Recent Labs    08/06/21 0441 08/06/21 1412 08/07/21 0346 08/08/21 0343  HGB 10.1*  --  10.2* 10.6*  HCT 31.8*  --  31.3* 33.2*  PLT 361  --  335 300  LABPROT 24.1*  --  24.6* 24.2*  INR 2.2*  --  2.2* 2.2*  CREATININE 1.43* 1.48* 1.47* 1.41*     Estimated Creatinine Clearance: 85.5 mL/min (A) (by C-G formula based on SCr of 1.41 mg/dL (H)).   Medical History: Past Medical History:  Diagnosis Date   Anxiety    Aspirin allergy    Childhood asthma    Chronic systolic CHF (congestive heart failure) ([place])    a. EF 20-25% in 2012. b. EF 45-50% in 10/2011 with nonischemic nuc - presumed NICM. c. 12/2014 Echo: Sev depressed LV fxn, sev dil LV, mild LVH, mild MR, sev dil LA, mildly reduced RV fxn.   CKD (chronic kidney disease) stage 2, GFR 60-89 ml/min    H/O vasectomy 12/2019   High cholesterol    Hypertension    Morbid obesity ([place])    Nephrolithiasis    OSA on CPAP    Paroxysmal atrial fibrillation ([place])    Presumed NICM    a. 04/2014 Myoview: EF 26%, glob HK, sev glob HK, ? prior infarct;  b. Never cathed 2/2 CKD.   Renal cell carcinoma ([place])    a. s/p Rt robotic assisted partial converted to radical nephrectomy on 01/2013.   Troponin level elevated    a. 04/2014, 12/2014: felt due to CHF.   Type II diabetes mellitus (HCC)    Ventricular tachycardia    a. appropriate ICD therapy 12/2017    Assessment: Patient is status post LVAD placement on 10/17. Warfarin was initiated on 10/18.    S/p DCCV 10/23.  INR stable at 2.2, will continue current dosing.  No s/sx of bleeding or infusion issues.   Goal of Therapy:  INR 2-2.5 Monitor platelets by anticoagulation protocol: Yes   Plan:  Continue Warfarin 4 mg daily at discharge Follow up INR check in clinic next week  Erin Hearing PharmD., BCPS Clinical Pharmacist 08/08/2021 10:23 AM

## 2021-08-08 NOTE — Patient Outreach (Signed)
Care Coordination  08/08/2021  DYSHAWN CANGELOSI 1976/04/21 [id number]  [name] is currently admitted as an inpatient at [facility] team will follow the progress of JUNIOR HUEZO and follow up upon discharge.   [name] RN, BSN [place]  Tri[facility] Curator - Managed Medicaid High Risk [phone].

## 2021-08-08 NOTE — TOC Progression Note (Signed)
Transition of Care [facility]) - Progression Note    Patient Details  Name: Jeffrey [name] MRN: [id number] Date of Birth: 12-16-1975  Transition of Care [facility]) CM/SW [place], [place] Phone Number: 08/08/2021, 12:16 PM  Clinical Narrative:    HF CSW spoke with Jeffrey [name] at bedside to discuss his transition home today and Jeffrey [name] reported to be in high spirits and is looking forward to a longer life with his LVAD. CSW provided Jeffrey [name] with HF team phone number and cone transportation's phone number as well for support as needed. Jeffrey [name] reported that he hasn't heard anything from disability and that he will make the HF team aware once his case is assigned. Jeffrey [name] reported that he would also like to pursue Food Stamps as well. CSW will send a message to the HF outpatient CSW regarding the Food Stamps and to follow up at his next appointment at the [facility] clinic. Jeffrey [name] reported either his mom or wife will provide transportation at discharge and medications have been delivered at bedside.   CSW will continue to follow throughout discharge.   Expected Discharge Plan: [place] Barriers to Discharge: Continued Medical Work up  Expected Discharge Plan and Services Expected Discharge Plan: [place] In-house Referral: Clinical Social Work Discharge Planning Services: CM Consult Post Acute Care Choice: [place] arrangements for the past 2 months: Single Family Home Expected Discharge Date: 08/08/21                 DME Agency: [employer] Date DME Agency Contacted: 08/08/21 Time DME Agency Contacted: 1751 Representative spoke with at DME Agency: Freda Munro HH Arranged: PT ([place])   Date [place]: 08/08/21 Time [place]: 28 Representative spoke with at [place]: [place] Determinants of Health ([place]) Interventions Depression Interventions/Treatment : Counseling  Readmission Risk  Interventions No flowsheet data found.  Shavontae Gibeault, MSW, [place] Heart Failure Social Worker

## 2021-08-08 NOTE — Consult Note (Signed)
[facility] Inpatient Consult   08/08/2021  Jeffrey [name] 10-Apr-1976 [id number]  [name]  Patient chart reviewed with noted high unplanned readmission risk score.  Patient is currently active with [name] for chronic disease management services in the Managed Medicaid program. Patient has been engaged by a [name]. Per review, post hospital follow up scheduled to continue.  Of note, [name] services does not replace or interfere with any services that are needed or arranged by inpatient [facility] care management team.  [name], MSN, RN [facility] Phone [phone]  Toll free office [phone]

## 2021-08-09 DIAGNOSIS — J9622 Acute and chronic respiratory failure with hypercapnia: Secondary | ICD-10-CM | POA: Diagnosis not present

## 2021-08-09 DIAGNOSIS — E1165 Type 2 diabetes mellitus with hyperglycemia: Secondary | ICD-10-CM | POA: Diagnosis not present

## 2021-08-09 DIAGNOSIS — M109 Gout, unspecified: Secondary | ICD-10-CM | POA: Diagnosis not present

## 2021-08-09 DIAGNOSIS — E1122 Type 2 diabetes mellitus with diabetic chronic kidney disease: Secondary | ICD-10-CM | POA: Diagnosis not present

## 2021-08-09 DIAGNOSIS — F419 Anxiety disorder, unspecified: Secondary | ICD-10-CM | POA: Diagnosis not present

## 2021-08-09 DIAGNOSIS — I428 Other cardiomyopathies: Secondary | ICD-10-CM | POA: Diagnosis not present

## 2021-08-09 DIAGNOSIS — N1831 Chronic kidney disease, stage 3a: Secondary | ICD-10-CM | POA: Diagnosis not present

## 2021-08-09 DIAGNOSIS — J9621 Acute and chronic respiratory failure with hypoxia: Secondary | ICD-10-CM | POA: Diagnosis not present

## 2021-08-09 DIAGNOSIS — I5023 Acute on chronic systolic (congestive) heart failure: Secondary | ICD-10-CM | POA: Diagnosis not present

## 2021-08-09 DIAGNOSIS — I130 Hypertensive heart and chronic kidney disease with heart failure and stage 1 through stage 4 chronic kidney disease, or unspecified chronic kidney disease: Secondary | ICD-10-CM | POA: Diagnosis not present

## 2021-08-09 DIAGNOSIS — E7800 Pure hypercholesterolemia, unspecified: Secondary | ICD-10-CM | POA: Diagnosis not present

## 2021-08-09 DIAGNOSIS — I480 Paroxysmal atrial fibrillation: Secondary | ICD-10-CM | POA: Diagnosis not present

## 2021-08-09 DIAGNOSIS — J45909 Unspecified asthma, uncomplicated: Secondary | ICD-10-CM | POA: Diagnosis not present

## 2021-08-11 ENCOUNTER — Other Ambulatory Visit: Payer: Self-pay

## 2021-08-11 ENCOUNTER — Encounter (HOSPITAL_COMMUNITY): Payer: Self-pay | Admitting: *Deleted

## 2021-08-11 ENCOUNTER — Telehealth: Payer: Self-pay

## 2021-08-11 ENCOUNTER — Telehealth (HOSPITAL_COMMUNITY): Payer: Self-pay

## 2021-08-11 NOTE — Telephone Encounter (Signed)
Called patient to see if he is interested in the Cardiac Rehab Program. Patient expressed interest. Explained scheduling process and went over insurance process, patient verbalized understanding. Will contact patient for scheduling once f/u has been completed. 

## 2021-08-11 NOTE — Telephone Encounter (Signed)
Transition Care Management Follow-up Telephone Call Date of discharge and from where: 08/08/2021-[place] How have you been since you were released from the hospital? Patient stated he is doing fine. Any questions or concerns? No  Items Reviewed: Did the pt receive and understand the discharge instructions provided? Yes  Medications obtained and verified? Yes  Other? No  Any new allergies since your discharge? No  Dietary orders reviewed? No Do you have support at home? Yes   Home Care and Equipment/Supplies: Were home health services ordered? yes If so, what is the name of the agency? [facility] Has the agency set up a time to come to the patient's home? no Were any new equipment or medical supplies ordered?  No What is the name of the medical supply agency? N/A Were you able to get the supplies/equipment? not applicable Do you have any questions related to the use of the equipment or supplies? No  Functional Questionnaire: (I = Independent and D = Dependent) ADLs: I  Bathing/Dressing- I  Meal Prep- D  Eating- I  Maintaining continence- I  Transferring/Ambulation- D  Managing Meds- I  Follow up appointments reviewed:  PCP Hospital f/u appt confirmed? No   Specialist Hospital f/u appt confirmed? Yes  Scheduled to see Cardiology on 08/13/2021 @ 9am. Are transportation arrangements needed? No  If their condition worsens, is the pt aware to call PCP or go to the Emergency Dept.? Yes Was the patient provided with contact information for the PCP's office or ED? Yes Was to pt encouraged to call back with questions or concerns? Yes

## 2021-08-12 ENCOUNTER — Other Ambulatory Visit (HOSPITAL_COMMUNITY): Payer: Self-pay | Admitting: Unknown Physician Specialty

## 2021-08-12 ENCOUNTER — Other Ambulatory Visit: Payer: Self-pay

## 2021-08-12 DIAGNOSIS — I5022 Chronic systolic (congestive) heart failure: Secondary | ICD-10-CM

## 2021-08-12 DIAGNOSIS — I428 Other cardiomyopathies: Secondary | ICD-10-CM | POA: Diagnosis not present

## 2021-08-12 DIAGNOSIS — Z7901 Long term (current) use of anticoagulants: Secondary | ICD-10-CM

## 2021-08-12 DIAGNOSIS — N1831 Chronic kidney disease, stage 3a: Secondary | ICD-10-CM | POA: Diagnosis not present

## 2021-08-12 DIAGNOSIS — Z95811 Presence of heart assist device: Secondary | ICD-10-CM

## 2021-08-12 DIAGNOSIS — E7800 Pure hypercholesterolemia, unspecified: Secondary | ICD-10-CM | POA: Diagnosis not present

## 2021-08-12 DIAGNOSIS — F419 Anxiety disorder, unspecified: Secondary | ICD-10-CM | POA: Diagnosis not present

## 2021-08-12 DIAGNOSIS — E1122 Type 2 diabetes mellitus with diabetic chronic kidney disease: Secondary | ICD-10-CM | POA: Diagnosis not present

## 2021-08-12 DIAGNOSIS — M109 Gout, unspecified: Secondary | ICD-10-CM | POA: Diagnosis not present

## 2021-08-12 DIAGNOSIS — I480 Paroxysmal atrial fibrillation: Secondary | ICD-10-CM | POA: Diagnosis not present

## 2021-08-12 DIAGNOSIS — E1165 Type 2 diabetes mellitus with hyperglycemia: Secondary | ICD-10-CM | POA: Diagnosis not present

## 2021-08-12 DIAGNOSIS — I5023 Acute on chronic systolic (congestive) heart failure: Secondary | ICD-10-CM | POA: Diagnosis not present

## 2021-08-12 DIAGNOSIS — I130 Hypertensive heart and chronic kidney disease with heart failure and stage 1 through stage 4 chronic kidney disease, or unspecified chronic kidney disease: Secondary | ICD-10-CM | POA: Diagnosis not present

## 2021-08-12 DIAGNOSIS — J9621 Acute and chronic respiratory failure with hypoxia: Secondary | ICD-10-CM | POA: Diagnosis not present

## 2021-08-12 DIAGNOSIS — J9622 Acute and chronic respiratory failure with hypercapnia: Secondary | ICD-10-CM | POA: Diagnosis not present

## 2021-08-12 DIAGNOSIS — J45909 Unspecified asthma, uncomplicated: Secondary | ICD-10-CM | POA: Diagnosis not present

## 2021-08-13 ENCOUNTER — Other Ambulatory Visit: Payer: Self-pay

## 2021-08-13 ENCOUNTER — Ambulatory Visit (HOSPITAL_COMMUNITY)
Admit: 2021-08-13 | Discharge: 2021-08-13 | Disposition: A | Discharge status: Home / Self Care | Payer: Medicaid Other | Source: Ambulatory Visit | Attending: Cardiology | Admitting: Cardiology

## 2021-08-13 ENCOUNTER — Ambulatory Visit (HOSPITAL_COMMUNITY): Payer: Self-pay | Admitting: Pharmacist

## 2021-08-13 VITALS — BP 104/62 | Ht 68.0 in | Wt 291.6 lb

## 2021-08-13 DIAGNOSIS — Z95811 Presence of heart assist device: Secondary | ICD-10-CM | POA: Diagnosis not present

## 2021-08-13 DIAGNOSIS — I5023 Acute on chronic systolic (congestive) heart failure: Secondary | ICD-10-CM | POA: Insufficient documentation

## 2021-08-13 DIAGNOSIS — Z79899 Other long term (current) drug therapy: Secondary | ICD-10-CM | POA: Insufficient documentation

## 2021-08-13 DIAGNOSIS — I10 Essential (primary) hypertension: Secondary | ICD-10-CM | POA: Diagnosis not present

## 2021-08-13 DIAGNOSIS — Z7984 Long term (current) use of oral hypoglycemic drugs: Secondary | ICD-10-CM | POA: Diagnosis not present

## 2021-08-13 DIAGNOSIS — M109 Gout, unspecified: Secondary | ICD-10-CM | POA: Diagnosis not present

## 2021-08-13 DIAGNOSIS — G4733 Obstructive sleep apnea (adult) (pediatric): Secondary | ICD-10-CM | POA: Diagnosis not present

## 2021-08-13 DIAGNOSIS — Z905 Acquired absence of kidney: Secondary | ICD-10-CM | POA: Diagnosis not present

## 2021-08-13 DIAGNOSIS — I480 Paroxysmal atrial fibrillation: Secondary | ICD-10-CM | POA: Diagnosis not present

## 2021-08-13 DIAGNOSIS — I428 Other cardiomyopathies: Secondary | ICD-10-CM | POA: Insufficient documentation

## 2021-08-13 DIAGNOSIS — E1122 Type 2 diabetes mellitus with diabetic chronic kidney disease: Secondary | ICD-10-CM | POA: Insufficient documentation

## 2021-08-13 DIAGNOSIS — Z7901 Long term (current) use of anticoagulants: Secondary | ICD-10-CM | POA: Diagnosis not present

## 2021-08-13 DIAGNOSIS — I130 Hypertensive heart and chronic kidney disease with heart failure and stage 1 through stage 4 chronic kidney disease, or unspecified chronic kidney disease: Secondary | ICD-10-CM | POA: Insufficient documentation

## 2021-08-13 DIAGNOSIS — N1832 Chronic kidney disease, stage 3b: Secondary | ICD-10-CM | POA: Insufficient documentation

## 2021-08-13 DIAGNOSIS — F17200 Nicotine dependence, unspecified, uncomplicated: Secondary | ICD-10-CM | POA: Insufficient documentation

## 2021-08-13 DIAGNOSIS — I5022 Chronic systolic (congestive) heart failure: Secondary | ICD-10-CM | POA: Diagnosis not present

## 2021-08-13 DIAGNOSIS — Z794 Long term (current) use of insulin: Secondary | ICD-10-CM | POA: Insufficient documentation

## 2021-08-13 LAB — BASIC METABOLIC PANEL
Anion gap: 7 (ref 5–15)
BUN: 18 mg/dL (ref 6–20)
CO2: 17 mmol/L — ABNORMAL LOW (ref 22–32)
Calcium: 9.5 mg/dL (ref 8.9–10.3)
Chloride: 112 mmol/L — ABNORMAL HIGH (ref 98–111)
Creatinine, Ser: 1.43 mg/dL — ABNORMAL HIGH (ref 0.61–1.24)
GFR, Estimated: >60 mL/min (ref >60)
Glucose, Bld: 153 mg/dL — ABNORMAL HIGH (ref 70–99)
Potassium: 4.3 mmol/L (ref 3.5–5.1)
Sodium: 136 mmol/L (ref 135–145)

## 2021-08-13 LAB — TSH: TSH: 2.564 u[IU]/mL (ref 0.350–4.500)

## 2021-08-13 LAB — CBC
HCT: 37 % — ABNORMAL LOW (ref 39.0–52.0)
Hemoglobin: 11.1 g/dL — ABNORMAL LOW (ref 13.0–17.0)
MCH: 22.9 pg — ABNORMAL LOW (ref 26.0–34.0)
MCHC: 30 g/dL (ref 30.0–36.0)
MCV: 76.4 fL — ABNORMAL LOW (ref 80.0–100.0)
Platelets: 407 10*3/uL — ABNORMAL HIGH (ref 150–400)
RBC: 4.84 MIL/uL (ref 4.22–5.81)
RDW: 18.7 % — ABNORMAL HIGH (ref 11.5–15.5)
WBC: 6.4 10*3/uL (ref 4.0–10.5)
nRBC: 0 % (ref 0.0–0.2)

## 2021-08-13 LAB — PROTIME-INR
INR: 1.4 — ABNORMAL HIGH (ref 0.8–1.2)
Prothrombin Time: 16.9 seconds — ABNORMAL HIGH (ref 11.4–15.2)

## 2021-08-13 LAB — LACTATE DEHYDROGENASE: LDH: 206 U/L — ABNORMAL HIGH (ref 98–192)

## 2021-08-13 MED ORDER — ENOXAPARIN SODIUM 60 MG/0.6ML IJ SOSY
60.0000 mg | PREFILLED_SYRINGE | Freq: Two times a day (BID) | INTRAMUSCULAR | 0 refills | Status: DC
  Filled 2021-08-13: qty 6, 5d supply, fill #0

## 2021-08-13 NOTE — Patient Instructions (Signed)
1. Coumadin - Take 2 tablets tonight and tomorrow and then resume a dose of 2 tablets on Monday and 1 tablet on all other days. 2. Start Lovenox injections today. You will take 1 shot every 12 hrs to equal 4 shots total. 3. Return to clinic in 1 week

## 2021-08-13 NOTE — Progress Notes (Addendum)
Patient presents for hosp follow up in [facility] today. Reports no problems with VAD equipment or concerns with drive line.  Pt states they have been doing well at home. He tells me that his gout is doing much better and that home OT discharged him yesterday.  Pt given 2 sets of batteries today in clinic.    Vital Signs:  Temp: 98.6 Doppler Pressure 118   Automatc BP: 104/62 (79) HR:89 NSR  SPO2:UTO  %   Weight: 291.6 lb w/ eqt Last weight: 290 lb hosp d/c wt Home weights: 281 this morning lbs   VAD Indication: Destination Therapy due to smoking and BMI   VAD interrogation & Equipment Management: Speed:6200 Flow: 5.2 Power:5.1 w    PI:3.2   Alarms: no clinical alarms Events: 10  Fixed speed 6200 Low speed limit: 5900   Primary Controller:  Replace back up battery in 29 months. Back up controller:   Replace back up battery in 32 months.   Annual Equipment Maintenance on UBC/PM was performed on 07/21/21.    I reviewed the LVAD parameters from today and compared the results to the patient's prior recorded data. LVAD interrogation was NEGATIVE for significant power changes, NEGATIVE for clinical alarms and STABLE for PI events/speed drops. No programming changes were made and pump is functioning within specified parameters. Pt is performing daily controller and system monitor self tests along with completing weekly and monthly maintenance for LVAD equipment.   LVAD equipment check completed and is in good working order. Back-up equipment present. Charged back up battery and performed self-test on equipment.    Exit Site Care: Drive line is being maintained daily by [employer] his wife. Existing VAD dressing removed and site care performed using sterile technique. Drive line exit site cleaned with Chlora prep applicators x 2, allowed to dry, and gauze dressing with Silver strip applied. Exit site healing and unincorporated, the velour is fully implanted at exit site. Small amount  of serous drainage. Single suture removed today along with CT sutures. No redness, tenderness, foul odor or rash noted. Pt has some skin irritation from the tape surrounding the driveline. I have provided them with a roll of silk tape today. Drive line anchor re-applied. May advance to every other day dressing changes. Pt given 7 daily kits, 7 anchors and roll of silk tape.        Significant Events on VAD Support:    Device: BS Therapies: on at 250 BPM Last check: in hosp    BP & Labs:  MAP 118 - Doppler is reflecting modified systolic   Hgb 38.1 - No S/S of bleeding. Specifically denies melena/BRBPR or nosebleeds.   LDH stable at 206 with established baseline of 160 - 250. Denies tea-colored urine. No power elevations noted on interrogation.   Plan:   1. Coumadin - Take 2 tablets tonight and tomorrow and then resume a dose of 2 tablets on Monday and 1 tablet on all other days. 2. Start Lovenox injections today. You will take 1 shot every 12 hrs to equal 4 shots total. 3. Return to clinic in 1 week     Tanda Rockers RN [place] Coordinator    Office: [phone] 24/7 Emergency VAD Pager: [phone]

## 2021-08-13 NOTE — Progress Notes (Signed)
LVAD INR 

## 2021-08-14 ENCOUNTER — Other Ambulatory Visit: Payer: Self-pay

## 2021-08-14 ENCOUNTER — Other Ambulatory Visit: Payer: Self-pay | Admitting: Nurse Practitioner

## 2021-08-14 ENCOUNTER — Other Ambulatory Visit (HOSPITAL_COMMUNITY): Payer: Self-pay

## 2021-08-14 DIAGNOSIS — E1122 Type 2 diabetes mellitus with diabetic chronic kidney disease: Secondary | ICD-10-CM

## 2021-08-14 MED ORDER — TRUEPLUS 5-BEVEL PEN NEEDLES 29G X 12.7MM MISC
1.0000 | Freq: Three times a day (TID) | 3 refills | Status: DC
  Filled 2021-08-14: qty 100, fill #0
  Filled 2021-08-15 – 2021-12-15 (×2): qty 100, 25d supply, fill #0
  Filled 2022-04-08: qty 100, 25d supply, fill #1
  Filled 2022-07-06: qty 100, 30d supply, fill #2

## 2021-08-14 MED ORDER — "INSULIN SYRINGE-NEEDLE U-100 31G X 5/16"" 0.5 ML MISC"
11 refills | Status: DC
  Filled 2021-08-14: qty 100, fill #0

## 2021-08-14 NOTE — Telephone Encounter (Signed)
In the pharmacy notes it says the patient is on Novolog Flexpen. He is needing the pen needles instead of the syringes. Please resend new GY

## 2021-08-15 ENCOUNTER — Other Ambulatory Visit: Payer: Self-pay

## 2021-08-18 ENCOUNTER — Encounter: Payer: Self-pay | Admitting: Diagnostic Neuroimaging

## 2021-08-18 ENCOUNTER — Ambulatory Visit (INDEPENDENT_AMBULATORY_CARE_PROVIDER_SITE_OTHER): Payer: Medicaid Other | Admitting: Diagnostic Neuroimaging

## 2021-08-18 VITALS — Ht 67.0 in | Wt 279.7 lb

## 2021-08-18 DIAGNOSIS — M25531 Pain in right wrist: Secondary | ICD-10-CM

## 2021-08-18 NOTE — Progress Notes (Signed)
VAD CLINIC Follow-up Visit  PCP: Teena Dunk, NP HF MD: DB   HPI:  Jeffrey [name] is a 45 y.o. male with a history of  poorly controlled HTN, R renal cell carcinoma s/p nephrectomy, CKD IIIa, DM2, OSA, gout, morbid obesity and systolic HF due to NICM. S/p HM-3 VAD placement 07/21/21  Has [place]-[employer] S-ICD   HF dates back to 2012 with EF 25%   Admitted 7/22 with CHF/cardiogenic shock in setting AF with RVR. He was placed on amio gtt and underwent cardioversion. Echo EF < 20%, severe LV dilation, moderate RV dysfunction. Required NE and dobutamine, which were eventually weaned off. Hospitalization c/b by acute respiratory failure with hypoxia requiring mechanical ventilation. GDMT was limited by hypotension and VAD workup initiated. Creatinine peaked at 4.65 improved to 1.58 which is his baseline. He was discharged to CIR, weight 299 lbs.   He was presented at [facility] and discussed with [facility] transplant team and he was approved for DT VAD. Underwent HM3 LVAD by Dr. Cyndia Bent on 07/21/21. Had post-op AF requiring DC-CV.  Follow up for Heart Failure/LVAD:  Here with his wife. Feeling good. Getting around more. Denies orthopnea or PND. No fevers, chills or problems with driveline. No bleeding, melena or neuro symptoms. No VAD alarms. Taking all meds as prescribed.     VAD Indication: Destination Therapy due to smoking and BMI   VAD interrogation & Equipment Management: Speed:6200 Flow: 5.2 Power:5.1 w    PI:3.2   Alarms: no clinical alarms Events: 10  Fixed speed 6200 Low speed limit: 5900   Primary Controller:  Replace back up battery in 29 months. Back up controller:   Replace back up battery in 32 months.   Annual Equipment Maintenance on UBC/PM was performed on 07/21/21.    I reviewed the LVAD parameters from today and compared the results to the patient's prior recorded data. LVAD interrogation was NEGATIVE for significant power changes, NEGATIVE for clinical alarms and  STABLE for PI events/speed drops. No programming changes were made and pump is functioning within specified parameters. Pt is performing daily controller and system monitor self tests along with completing weekly and monthly maintenance for LVAD equipment.    Past Medical History:  Diagnosis Date   Anxiety    Aspirin allergy    Childhood asthma    Chronic systolic CHF (congestive heart failure) ([place])    a. EF 20-25% in 2012. b. EF 45-50% in 10/2011 with nonischemic nuc - presumed NICM. c. 12/2014 Echo: Sev depressed LV fxn, sev dil LV, mild LVH, mild MR, sev dil LA, mildly reduced RV fxn.   CKD (chronic kidney disease) stage 2, GFR 60-89 ml/min    H/O vasectomy 12/2019   High cholesterol    Hypertension    Morbid obesity ([place])    Nephrolithiasis    OSA on CPAP    Paroxysmal atrial fibrillation ([place])    Presumed NICM    a. 04/2014 Myoview: EF 26%, glob HK, sev glob HK, ? prior infarct;  b. Never cathed 2/2 CKD.   Renal cell carcinoma ([place])    a. s/p Rt robotic assisted partial converted to radical nephrectomy on 01/2013.   Troponin level elevated    a. 04/2014, 12/2014: felt due to CHF.   Type II diabetes mellitus (H[facility])    Ventricular tachycardia    a. appropriate ICD therapy 12/2017    Current Outpatient Medications  Medication Sig Dispense Refill   enoxaparin (LOVENOX) 60 MG/0.6ML injection  Inject 0.6 mLs (60 mg total) into the skin every 12 (twelve) hours. 6 mL 0   acetaminophen (TYLENOL) 325 MG tablet Take 2 tablets (650 mg total) by mouth every 6 (six) hours as needed for fever.     allopurinol (ZYLOPRIM) 100 MG tablet Take 1 tablet (100 mg total) by mouth daily. 90 tablet 3   amiodarone (PACERONE) 200 MG tablet Take 1 tablet (200 mg total) by mouth daily. 30 tablet 6   colchicine 0.6 MG tablet Take 1 tablet (0.6 mg total) by mouth daily. 30 tablet 6   diclofenac Sodium (VOLTAREN) 1 % GEL Apply 2 g topically 4 (four) times daily. 100 g 0   docusate sodium (COLACE) 100 MG capsule  Take 1 capsule (100 mg total) by mouth 2 (two) times daily. 10 capsule 0   gabapentin (NEURONTIN) 100 MG capsule Take 2 capsules (200 mg total) by mouth 3 (three) times daily. 180 capsule 6   insulin aspart protamine - aspart (NOVOLOG 70/30 MIX) (70-30) 100 UNIT/ML FlexPen Inject 38 Units into the skin 2 (two) times daily. 15 mL 11   Insulin Pen Needle (TRUEPLUS 5-BEVEL PEN NEEDLES) 29G X 12.7MM MISC Use as directed in the morning, at noon, and at bedtime. 100 each 3   Insulin Syringe-Needle U-100 (TRUEPLUS INSULIN SYRINGE) 31G X 5/16" 0.5 ML MISC USE TO INJECT INSULIN TWICE DAILY. 100 each 11   isosorbide-hydrALAZINE (BIDIL) 20-37.5 MG tablet Take 2 tablets by mouth 3 (three) times daily. 180 tablet 6   meclizine (ANTIVERT) 25 MG tablet Take 1 tablet (25 mg total) by mouth 3 (three) times daily as needed for dizziness. 30 tablet 0   metFORMIN (GLUCOPHAGE) 1000 MG tablet Take 1 tablet (1,000 mg total) by mouth 2 (two) times daily with a meal. 60 tablet 11   oxyCODONE (OXY IR/ROXICODONE) 5 MG immediate release tablet Take 1 tablet (5 mg total) by mouth every 8 (eight) hours as needed for severe pain. 15 tablet 0   pantoprazole (PROTONIX) 40 MG tablet Take 1 tablet (40 mg total) by mouth daily. 30 tablet 0   polyethylene glycol (MIRALAX / GLYCOLAX) 17 g packet Take 17 g by mouth daily. 14 each 0   sacubitril-valsartan (ENTRESTO) 49-51 MG Take 1 tablet by mouth 2 (two) times daily. 60 tablet 6   sertraline (ZOLOFT) 25 MG tablet Take 1 tablet (25 mg total) by mouth daily. 30 tablet 3   spironolactone (ALDACTONE) 25 MG tablet Take 1 tablet (25 mg total) by mouth daily. 30 tablet 6   traMADol (ULTRAM) 50 MG tablet Take 1-2 tablets (50-100 mg total) by mouth every 4 (four) hours as needed for moderate pain. 30 tablet 0   traZODone (DESYREL) 100 MG tablet Take 1 tablet (100 mg total) by mouth at bedtime. 30 tablet 0   warfarin (COUMADIN) 4 MG tablet Take 1 tablet (4 mg total) by mouth daily at 4 PM. 45  tablet 6   No current facility-administered medications for this encounter.    Aspirin, Bee venom, Lisinopril, Shrimp (diagnostic), and Tomato  REVIEW OF SYSTEMS: All systems negative except as listed in HPI, PMH and Problem list.  I reviewed the LVAD parameters from today, and compared the results to the patient's prior recorded data.  No programming changes were made.  The LVAD is functioning within specified parameters.  The patient performs LVAD self-test daily.  LVAD interrogation was negative for any significant power changes, alarms or PI events/speed drops.  LVAD equipment check completed and is  in good working order.  Back-up equipment present.   LVAD education done on emergency procedures and precautions and reviewed exit site care.    Vitals:   08/13/21 0933  BP: 104/62  Weight: 132.3 kg (291 lb 9.6 oz)  Height: 5\' 8"  (1.727 m)     Vital Signs:  Temp: 98.6 Doppler Pressure 118              Automatc BP: 104/62 (79) HR:89 NSR  SPO2:UTO  %   Weight: 291.6 lb w/ eqt Last weight: 290 lb hosp d/c wt Home weights: 281 this morning lbs    General:  NAD.  HEENT: normal  Neck: supple. JVP not elevated.  Carotids 2+ bilat; no bruits. No lymphadenopathy or thryomegaly appreciated. Cor: LVAD hum.  Lungs: Clear. Abdomen: obese soft, nontender, non-distended. No hepatosplenomegaly. No bruits or masses. Good bowel sounds. Driveline site clean. Anchor in place.  Extremities: no cyanosis, clubbing, rash. Warm no edema  Neuro: alert & oriented x 3. No focal deficits. Moves all 4 without problem    ASSESSMENT AND PLAN:   1. Acute on Chronic systolic CHF - Nonischemic CM, end-stage - EF 2012; 20-25%  - Echo  8/22: EF < 20%, severe LV dilation, moderate RV dysfunction.   - RHC (8/22): well compensated hemodynamics. - s/p S-ICD - Not transplant candidate with BMI currently - S/p HM3 LVAD 10/17 (DT) - NYHA II Volume status looks good. Off diuretics - Continue Bidil 2 tabs tid,  spironolactone 25 mg daily and entresto 49-51 mg BID.    2. VAD  - s/p HM-3 placement 07/21/21  - VAD interrogated personally. Parameters stable. - 10/26 Ramp echo with speed increased. Now on 6200  - INR 1.4 on warfarin.  Discussed dosing with PharmD personally.Will need lovenox - On neurontin for pocket pain.  - DL site looks good pain    3 Atrial fibrillation: Paroxysmal - Had severe AF with RVR 7/22 s/p DC-CV x 2 - Seen by EP. Not a candidate for ablation due to body habitus  (could consider AVN ablation and CRT, if needed)  - Had post-op AF with RVR post VAD - 10/23 S/P DC-CV with restoration of NSR. - Remains in NSR. Continue po amio 200 mg daily.     - AC as above    4. CKD stage 3B  - Has solitary kidney.   - Baseline creatinine is 1.6-2.9.  - Creatinine 1.4  today.    5. OSA - Continue CPAP   6. DM2. Poorly controlled - On Jardiance. - Hgb A1c 9.4. - PCP following  7. Polyarticular gout. - quiescent  Total time spent 40 minutes. Over half that time spent discussing above.   Glori Bickers, MD  12:03 AM

## 2021-08-18 NOTE — Progress Notes (Signed)
[facility]  PATIENT: Jeffrey [name] DOB: [birth year]  REFERRING CLINICIAN: Fenton Foy, NP HISTORY FROM: PATIENT REASON FOR VISIT: new consult    HISTORICAL  CHIEF COMPLAINT:  Chief Complaint  Patient presents with   Right wrist pain    Rm 7 New Pt "no longer have wrist pain"     HISTORY OF PRESENT ILLNESS:   45 year old male here for evaluation of right wrist pain.  Patient had symptoms around September 2021 following right radial line access.  He developed some right wrist pain.  He had right upper extremity ultrasound demonstrating small DVT in the right brachial vein.  Patient was on anticoagulation.  He had intermittent symptoms since that time.  He had some exacerbation of symptoms around June and July 2022.  Follow-up ultrasound testing was negative for DVT.  Patient was hospitalized several times and came out of the hospital eventually in September 2022.  Since that time symptoms have resolved.   REVIEW OF SYSTEMS: Full 14 system review of systems performed and negative with exception of: As per HPI.  ALLERGIES: Allergies  Allergen Reactions   Aspirin Shortness Of Breath, Itching and Rash     Burning sensation (Patient reports he tolerates other NSAIDS)    Bee Venom Hives and Swelling   Lisinopril Cough   Shrimp (Diagnostic) Rash   Tomato Rash    HOME MEDICATIONS: Outpatient Medications Prior to Visit  Medication Sig Dispense Refill   acetaminophen (TYLENOL) 325 MG tablet Take 2 tablets (650 mg total) by mouth every 6 (six) hours as needed for fever.     allopurinol (ZYLOPRIM) 100 MG tablet Take 1 tablet (100 mg total) by mouth daily. 90 tablet 3   amiodarone (PACERONE) 200 MG tablet Take 1 tablet (200 mg total) by mouth daily. 30 tablet 6   colchicine 0.6 MG tablet Take 1 tablet (0.6 mg total) by mouth daily. 30 tablet 6   diclofenac Sodium (VOLTAREN) 1 % GEL Apply 2 g topically 4 (four) times daily. 100 g 0   docusate sodium (COLACE) 100  MG capsule Take 1 capsule (100 mg total) by mouth 2 (two) times daily. 10 capsule 0   enoxaparin (LOVENOX) 60 MG/0.6ML injection Inject 0.6 mLs (60 mg total) into the skin every 12 (twelve) hours. 6 mL 0   gabapentin (NEURONTIN) 100 MG capsule Take 2 capsules (200 mg total) by mouth 3 (three) times daily. 180 capsule 6   insulin aspart protamine - aspart (NOVOLOG 70/30 MIX) (70-30) 100 UNIT/ML FlexPen Inject 38 Units into the skin 2 (two) times daily. 15 mL 11   Insulin Pen Needle (TRUEPLUS 5-BEVEL PEN NEEDLES) 29G X 12.7MM MISC Use as directed in the morning, at noon, and at bedtime. 100 each 3   Insulin Syringe-Needle U-100 (TRUEPLUS INSULIN SYRINGE) 31G X 5/16" 0.5 ML MISC USE TO INJECT INSULIN TWICE DAILY. 100 each 11   isosorbide-hydrALAZINE (BIDIL) 20-37.5 MG tablet Take 2 tablets by mouth 3 (three) times daily. 180 tablet 6   meclizine (ANTIVERT) 25 MG tablet Take 1 tablet (25 mg total) by mouth 3 (three) times daily as needed for dizziness. 30 tablet 0   metFORMIN (GLUCOPHAGE) 1000 MG tablet Take 1 tablet (1,000 mg total) by mouth 2 (two) times daily with a meal. 60 tablet 11   oxyCODONE (OXY IR/ROXICODONE) 5 MG immediate release tablet Take 1 tablet (5 mg total) by mouth every 8 (eight) hours as needed for severe pain. 15 tablet 0   pantoprazole (PROTONIX) 40  MG tablet Take 1 tablet (40 mg total) by mouth daily. 30 tablet 0   polyethylene glycol (MIRALAX / GLYCOLAX) 17 g packet Take 17 g by mouth daily. 14 each 0   sacubitril-valsartan (ENTRESTO) 49-51 MG Take 1 tablet by mouth 2 (two) times daily. 60 tablet 6   sertraline (ZOLOFT) 25 MG tablet Take 1 tablet (25 mg total) by mouth daily. 30 tablet 3   spironolactone (ALDACTONE) 25 MG tablet Take 1 tablet (25 mg total) by mouth daily. 30 tablet 6   traMADol (ULTRAM) 50 MG tablet Take 1-2 tablets (50-100 mg total) by mouth every 4 (four) hours as needed for moderate pain. 30 tablet 0   traZODone (DESYREL) 100 MG tablet Take 1 tablet (100 mg  total) by mouth at bedtime. 30 tablet 0   warfarin (COUMADIN) 4 MG tablet Take 1 tablet (4 mg total) by mouth daily at 4 PM. 45 tablet 6   No facility-administered medications prior to visit.    PAST MEDICAL HISTORY: Past Medical History:  Diagnosis Date   Anxiety    Aspirin allergy    Childhood asthma    Chronic systolic CHF (congestive heart failure) ([place])    a. EF 20-25% in 2012. b. EF 45-50% in 10/2011 with nonischemic nuc - presumed NICM. c. 12/2014 Echo: Sev depressed LV fxn, sev dil LV, mild LVH, mild MR, sev dil LA, mildly reduced RV fxn.   CKD (chronic kidney disease) stage 2, GFR 60-89 ml/min    H/O vasectomy 12/2019   High cholesterol    Hypertension    Morbid obesity ([place])    Nephrolithiasis    OSA on CPAP    Paroxysmal atrial fibrillation ([place])    Presumed NICM    a. 04/2014 Myoview: EF 26%, glob HK, sev glob HK, ? prior infarct;  b. Never cathed 2/2 CKD.   Renal cell carcinoma ([place])    a. s/p Rt robotic assisted partial converted to radical nephrectomy on 01/2013.   Troponin level elevated    a. 04/2014, 12/2014: felt due to CHF.   Type II diabetes mellitus ([facility])    Ventricular tachycardia    a. appropriate ICD therapy 12/2017    PAST SURGICAL HISTORY: Past Surgical History:  Procedure Laterality Date   APPENDECTOMY  07/2004   BIOPSY  02/10/2021   Procedure: BIOPSY;  Surgeon: Gatha Mayer, MD;  Location: [facility];  Service: Endoscopy;;   CARDIAC CATHETERIZATION N/A 05/17/2015   Procedure: Right/Left Heart Cath and Coronary Angiography;  Surgeon: Jolaine Artist, MD;  Location: [place] [facility];  Service: Cardiovascular;  Laterality: N/A;   CARDIOVERSION N/A 04/30/2021   Procedure: CARDIOVERSION;  Surgeon: Larey Dresser, MD;  Location: [facility] ENDOSCOPY;  Service: Cardiovascular;  Laterality: N/A;   CARDIOVERSION N/A 07/27/2021   Procedure: CARDIOVERSION;  Surgeon: Jolaine Artist, MD;  Location: [place];  Service: Cardiovascular;  Laterality: N/A;    COLONOSCOPY WITH PROPOFOL N/A 02/10/2021   Procedure: COLONOSCOPY WITH PROPOFOL;  Surgeon: Gatha Mayer, MD;  Location: [facility];  Service: Endoscopy;  Laterality: N/A;   EP IMPLANTABLE DEVICE N/A 05/17/2015   Procedure: SubQ ICD Implant;  Surgeon: Will Meredith Leeds, MD;  Location: [place] [facility];  Service: Cardiovascular;  Laterality: N/A;   ESOPHAGOGASTRODUODENOSCOPY (EGD) WITH PROPOFOL N/A 02/10/2021   Procedure: ESOPHAGOGASTRODUODENOSCOPY (EGD) WITH PROPOFOL;  Surgeon: Gatha Mayer, MD;  Location: [facility];  Service: Endoscopy;  Laterality: N/A;   INSERTION OF IMPLANTABLE LEFT VENTRICULAR ASSIST DEVICE N/A 07/21/2021  Procedure: INSERTION OF IMPLANTABLE LEFT VENTRICULAR ASSIST DEVICE;  Surgeon: Gaye Pollack, MD;  Location: [place];  Service: Open Heart Surgery;  Laterality: N/A;   RIGHT HEART CATH N/A 05/13/2021   Procedure: RIGHT HEART CATH;  Surgeon: Jolaine Artist, MD;  Location: [place] [facility];  Service: Cardiovascular;  Laterality: N/A;   RIGHT HEART CATH N/A 07/18/2021   Procedure: RIGHT HEART CATH;  Surgeon: Jolaine Artist, MD;  Location: [place] [facility];  Service: Cardiovascular;  Laterality: N/A;   ROBOTIC ASSISTED LAPAROSCOPIC LYSIS OF ADHESION  01/13/2013   Procedure: ROBOTIC ASSISTED LAPAROSCOPIC LYSIS OF ADHESION EXTENSIVE;  Surgeon: Alexis Frock, MD;  Location: [facility];  Service: Urology;;   ROBOTIC ASSITED PARTIAL NEPHRECTOMY Right 01/13/2013   Procedure: ROBOTIC ASSITED PARTIAL NEPHRECTOMY CONVERTED TO ROBOTIC ASSISTED RIGHT RADICAL NEPHRECTOMY;  Surgeon: Alexis Frock, MD;  Location: [facility];  Service: Urology;  Laterality: Right;   SUBQ ICD CHANGEOUT N/A 05/16/2020   Procedure: SUBQ ICD CHANGEOUT;  Surgeon: [name], MD;  Location: [place] [facility];  Service: Cardiovascular;  Laterality: N/A;   SUBQ ICD CHANGEOUT N/A 05/15/2020   Procedure: SUBQ ICD CHANGEOUT;  Surgeon: [name], MD;  Location: [place] [facility];  Service:  Cardiovascular;  Laterality: N/A;   TEE WITHOUT CARDIOVERSION N/A 04/30/2021   Procedure: TRANSESOPHAGEAL ECHOCARDIOGRAM (TEE);  Surgeon: Larey Dresser, MD;  Location: [facility] ENDOSCOPY;  Service: Cardiovascular;  Laterality: N/A;   TEE WITHOUT CARDIOVERSION N/A 07/21/2021   Procedure: TRANSESOPHAGEAL ECHOCARDIOGRAM (TEE);  Surgeon: Gaye Pollack, MD;  Location: [place];  Service: Open Heart Surgery;  Laterality: N/A;   VASECTOMY      FAMILY HISTORY: Family History  Problem Relation Age of Onset   Hypertension Mother    Diabetes Maternal Aunt    Heart attack Neg Hx    Stroke Neg Hx    Colon cancer Neg Hx    Pancreatic cancer Neg Hx    Stomach cancer Neg Hx    Liver cancer Neg Hx    Esophageal cancer Neg Hx     SOCIAL HISTORY: Social History   Socioeconomic History   Marital status: Married    Spouse name: Kayson Tasker   Number of children: 37   Years of education: Not on file   Highest education level: Not on file  Occupational History   Not on file  Tobacco Use   Smoking status: Former    Packs/day: 0.25    Years: 22.00    Pack years: 5.50    Types: Cigarettes    Quit date: 04/30/2015    Years since quitting: 6.3   Smokeless tobacco: Never  Vaping Use   Vaping Use: Never used  Substance and Sexual Activity   Alcohol use: Not Currently    Comment: none   Drug use: Not Currently    Frequency: 14.0 times per week    Types: Marijuana    Comment: no longer smoking   Sexual activity: Not Currently    Birth control/protection: Condom  Other Topics Concern   Not on file  Social History Narrative   Married - 17 kids 67 boys 7 girls many are adults, 2 young children with current wife   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  Reports quitting  after last admission   Social Determinants of Health   Financial Resource Strain: High Risk   Difficulty of Paying Living Expenses: Very hard  Food Insecurity: Food Insecurity Present   Worried About Charity fundraiser in the Last Year: Sometimes true   Arboriculturist in the Last Year: Sometimes true  Transportation Needs: Public librarian (Medical): Yes   Lack of Transportation (Non-Medical): Yes  Physical Activity: Inactive   Days of Exercise per Week: 0 days   Minutes of Exercise per Session: 0 min  Stress: Not on file  Social Connections: Not on file  Intimate Partner Violence: Not At Risk   Fear of Current or Ex-Partner: No   Emotionally Abused: No   Physically Abused: No   Sexually Abused: No     PHYSICAL EXAM  GENERAL EXAM/CONSTITUTIONAL: Vitals:  Vitals:   08/18/21 0841  Weight: 279 lb 11.2 oz (126.9 kg)  Height: 5\' 7"  (1.702 m)   Body mass index is 43.81 kg/m. Wt Readings from Last 3 Encounters:  08/18/21 279 lb 11.2 oz (126.9 kg)  08/13/21 291 lb 9.6 oz (132.3 kg)  08/08/21 289 lb 14.5 oz (131.5 kg)   Patient is in no distress; well developed, nourished and groomed; neck is supple  CARDIOVASCULAR: Examination of carotid arteries is normal; no carotid bruits Regular rate and rhythm, no murmurs Examination of peripheral vascular system by observation and palpation is normal  EYES: Ophthalmoscopic exam of optic discs and posterior segments is normal; no papilledema or hemorrhages No results found.  MUSCULOSKELETAL: Gait, strength, tone, movements noted in Neurologic exam below  NEUROLOGIC: MENTAL STATUS:  No flowsheet data found. awake, alert, oriented to person, place and time recent and remote memory intact normal attention and concentration language fluent, comprehension intact, naming intact fund of knowledge appropriate  CRANIAL NERVE:  2nd - no papilledema on fundoscopic exam 2nd, 3rd, 4th, 6th -  pupils equal and reactive to light, visual fields full to confrontation, extraocular muscles intact, no nystagmus 5th - facial sensation symmetric 7th - facial strength symmetric 8th - hearing intact 9th - palate elevates symmetrically, uvula midline 11th - shoulder shrug symmetric 12th - tongue protrusion midline  MOTOR:  normal bulk and tone, full strength in the BUE, BLE  SENSORY:  normal and symmetric to light touch, temperature, vibration  COORDINATION:  finger-nose-finger, fine finger movements normal  REFLEXES:  deep tendon reflexes TRACE and symmetric  GAIT/STATION:  narrow based gait     DIAGNOSTIC DATA (LABS, IMAGING, TESTING) - I reviewed patient records, labs, notes, testing and imaging myself where available.  Lab Results  Component Value Date   WBC 6.4 08/13/2021   HGB 11.1 (L) 08/13/2021   HCT 37.0 (L) 08/13/2021   MCV 76.4 (L) 08/13/2021   PLT 407 (H) 08/13/2021      Component Value Date/Time   NA 136 08/13/2021 0907   NA 138 09/04/2020 1039   K 4.3 08/13/2021 0907   CL 112 (H) 08/13/2021 0907   CO2 17 (L) 08/13/2021 0907   GLUCOSE 153 (H) 08/13/2021 0907   BUN 18 08/13/2021 0907   BUN 23 09/04/2020 1039   CREATININE 1.43 (H) 08/13/2021 0907   CALCIUM 9.5 08/13/2021 0907   PROT 6.5 07/24/2021 0350   PROT 7.2 09/04/2020 1039   ALBUMIN 2.5 (L) 07/24/2021 0350   ALBUMIN 4.1 09/04/2020 1039   AST 26 07/24/2021 0350   ALT 13 07/24/2021 0350  ALKPHOS 72 07/24/2021 0350   BILITOT 0.7 07/24/2021 0350   BILITOT 0.2 09/04/2020 1039   GFRNONAA >60 08/13/2021 0907   GFRAA 66 09/04/2020 1039   Lab Results  Component Value Date   CHOL 174 06/10/2021   HDL 37 (L) 06/10/2021   LDLCALC 115 (H) 06/10/2021   TRIG 120 06/10/2021   CHOLHDL 4.7 06/10/2021   Lab Results  Component Value Date   HGBA1C 5.9 (H) 07/20/2021   Lab Results  Component Value Date   VITAMINB12 201 (L) 10/28/2020   Lab Results  Component Value Date   TSH 2.564 08/13/2021     06/06/20 RUE u/s Right: Findings consistent with acute deep vein thrombosis involving a single right radial vein.  04/12/21 RUE u/s - No evidence of DVT within the right upper extremity.    ASSESSMENT AND PLAN  45 y.o. year old male here with intermittent wrist pain since September 2021, previously related to right radial vein DVT.  Symptoms now resolved in September 2022.   Dx:  1. Right wrist pain      PLAN:  Right wrist pain - intermittent since Sept 2021; prior right radial vein DVT; history of gout - symptoms resolved since ~Sept 2022; monitor for now  Return for return to PCP.    Penni Bombard, MD 99/37/1696, 7:89 AM Certified in Neurology, Neurophysiology and Neuroimaging  [facility] [address] [place], [place] [address] [phone]

## 2021-08-19 ENCOUNTER — Other Ambulatory Visit: Payer: Self-pay

## 2021-08-19 ENCOUNTER — Other Ambulatory Visit: Payer: Self-pay | Admitting: Obstetrics and Gynecology

## 2021-08-19 ENCOUNTER — Other Ambulatory Visit (HOSPITAL_COMMUNITY): Payer: Self-pay | Admitting: Unknown Physician Specialty

## 2021-08-19 DIAGNOSIS — Z7901 Long term (current) use of anticoagulants: Secondary | ICD-10-CM

## 2021-08-19 DIAGNOSIS — I5022 Chronic systolic (congestive) heart failure: Secondary | ICD-10-CM

## 2021-08-19 DIAGNOSIS — Z95811 Presence of heart assist device: Secondary | ICD-10-CM

## 2021-08-19 NOTE — Patient Outreach (Signed)
Medicaid Managed Care   Nurse Care Manager Note  08/19/2021 Name:  Jeffrey [name] MRN:  [id number] DOB:  [birth year]  Jeffrey [name] is an 45 y.o. year old male who is a primary patient of Jeffrey [name].  The [facility] Managed Care Coordination team was consulted for assistance with:    Chronic healthcare management needs.  Jeffrey [name] was given information about Medicaid Managed Care Coordination team services today. Jeffrey [name] Patient agreed to services and verbal consent obtained.  Engaged with patient by telephone for follow up visit in response to provider referral for case management and/or care coordination services.   Assessments/Interventions:  Review of past medical history, allergies, medications, health status, including review of consultants reports, laboratory and other test data, was performed as part of comprehensive evaluation and provision of chronic care management services.  SDOH (Social Determinants of Health) assessments and interventions performed: SDOH Interventions    Flowsheet Row Most Recent Value  SDOH Interventions   Stress Interventions Intervention Not Indicated  Social Connections Interventions Intervention Not Indicated       Care Plan  Allergies  Allergen Reactions   Aspirin Shortness Of Breath, Itching and Rash     Burning sensation (Patient reports he tolerates other NSAIDS)    Bee Venom Hives and Swelling   Lisinopril Cough   Shrimp (Diagnostic) Rash   Tomato Rash    Medications Reviewed Today     Reviewed by Jeffrey Medicus, RN (Registered Nurse) on 08/19/21 at 1356  Med List Status: <None>   Medication Order Taking? Sig Documenting Provider Last Dose Status Informant  acetaminophen (TYLENOL) 325 MG tablet [id number] No Take 2 tablets (650 mg total) by mouth every 6 (six) hours as needed for fever. Jeffrey Grinder [name], [name] Taking Active Self  allopurinol (ZYLOPRIM) 100 MG tablet [id number] No Take 1 tablet (100 mg total) by mouth daily.  Jeffrey [name] Taking Active Self  amiodarone (PACERONE) 200 MG tablet [id number] No Take 1 tablet (200 mg total) by mouth daily. Jeffrey Grinder [name], [name] Taking Active   colchicine 0.6 MG tablet [id number] No Take 1 tablet (0.6 mg total) by mouth daily. Jeffrey Grinder [name], [name] Taking Active   diclofenac Sodium (VOLTAREN) 1 % GEL [id number] No Apply 2 g topically 4 (four) times daily. Jeffrey [name] Taking Active Self  docusate sodium (COLACE) 100 MG capsule [id number] No Take 1 capsule (100 mg total) by mouth 2 (two) times daily. Jeffrey Grinder [name], [name] Taking Active Self  enoxaparin (LOVENOX) 60 MG/0.6ML injection [id number] No Inject 0.6 mLs (60 mg total) into the skin every 12 (twelve) hours. Jeffrey [name] Taking Active   gabapentin (NEURONTIN) 100 MG capsule [id number] No Take 2 capsules (200 mg total) by mouth 3 (three) times daily. Jeffrey [name] Taking Active   insulin aspart protamine - aspart (NOVOLOG 70/30 MIX) (70-30) 100 UNIT/ML FlexPen [id number] No Inject 38 Units into the skin 2 (two) times daily. Jeffrey [name] Taking Active Self  Insulin Pen Needle (TRUEPLUS 5-BEVEL PEN NEEDLES) 29G X 12.7MM MISC [id number] No Use as directed in the morning, at noon, and at bedtime. Jeffrey [name] Taking Active   Insulin Syringe-Needle U-100 (TRUEPLUS INSULIN SYRINGE) 31G X 5/16" 0.5 ML MISC [id number] No USE TO INJECT INSULIN TWICE DAILY. Jeffrey [name] Taking Active   isosorbide-hydrALAZINE (BIDIL) 20-37.5 MG tablet [id number] No Take 2 tablets by mouth 3 (three) times daily. Jeffrey [name]  Taking Active   meclizine (ANTIVERT) 25 MG tablet [id number] No Take 1 tablet (25 mg total) by mouth 3 (three) times daily as needed for dizziness. Jeffrey Grinder [name], [name] Taking Active   metFORMIN (GLUCOPHAGE) 1000 MG tablet [id number] No Take 1 tablet (1,000 mg total) by mouth 2 (two) times daily with a meal. Jeffrey [name] Taking Active Self  oxyCODONE (OXY IR/ROXICODONE) 5 MG immediate  release tablet [id number] No Take 1 tablet (5 mg total) by mouth every 8 (eight) hours as needed for severe pain. Jeffrey [name] Taking Active   pantoprazole (PROTONIX) 40 MG tablet [id number] No Take 1 tablet (40 mg total) by mouth daily. Jeffrey [name] Taking Active Self  polyethylene glycol (MIRALAX / GLYCOLAX) 17 g packet [id number] No Take 17 g by mouth daily. Jeffrey [name] Taking Active Self  sacubitril-valsartan (ENTRESTO) 49-51 MG [id number] No Take 1 tablet by mouth 2 (two) times daily. Jeffrey [name] Taking Active   sertraline (ZOLOFT) 25 MG tablet [id number] No Take 1 tablet (25 mg total) by mouth daily. Jeffrey [name] Taking Active Self  spironolactone (ALDACTONE) 25 MG tablet [id number] No Take 1 tablet (25 mg total) by mouth daily. Jeffrey [name] Taking Active Self  traMADol (ULTRAM) 50 MG tablet [id number] No Take 1-2 tablets (50-100 mg total) by mouth every 4 (four) hours as needed for moderate pain. Jeffrey Grinder [name], [name] Taking Active   traZODone (DESYREL) 100 MG tablet [id number] No Take 1 tablet (100 mg total) by mouth at bedtime. Jeffrey [name] Taking Active Self  warfarin (COUMADIN) 4 MG tablet [id number] No Take 1 tablet (4 mg total) by mouth daily at 4 PM. Jeffrey [name] Taking Active             Patient Active Problem List   Diagnosis Date Noted   Presence of left ventricular assist device (LVAD) ([place]) 07/25/2021   Acute on chronic systolic CHF (congestive heart failure) ([place]) 07/18/2021   Cardiogenic shock (HCC)    Atrial fibrillation with RVR ([place]) 04/30/2021   Acute hypoxemic respiratory failure (HCC)    Right wrist pain 04/16/2021   Absolute anemia    Rectal bleeding    Iron deficiency 12/25/2020   Long term current use of anticoagulant - apixaban 12/25/2020   Paroxysmal atrial fibrillation (HCC)    Lactic acidosis 11/22/2020   Hypotension 11/22/2020   Aspiration into airway    ICD (implantable  cardioverter-defibrillator) battery depletion 05/15/2020   Endotracheal tube present    Respiratory failure ([place])    Acute encephalopathy    Acute pulmonary edema (HCC)    NICM (nonischemic cardiomyopathy) ([place]) 04/24/2020   S/P vasectomy 12/20/2019   Anxiety 12/20/2019   Hemoglobin A1C between 7% and 9% indicating borderline diabetic control ([place]) 12/20/2019   Seasonal allergies 12/20/2019   Insomnia 12/20/2019   Vasectomy evaluation 06/20/2019   Visual problems 06/20/2019   Blurry vision, bilateral 06/20/2019   Hyperglycemia 02/13/2019   Hyperosmolar (nonketotic) coma ([place]) 01/19/2019   AICD (automatic cardioverter/defibrillator) present 01/19/2019   Hyperlipidemia 12/07/2018   Follow-up exam 11/22/2018   Class 3 severe obesity due to excess calories with serious comorbidity and body mass index (BMI) of 45.0 to 49.9 in adult [facility]) 11/22/2018   Dizziness 11/22/2018   Lingular pneumonia 08/04/2018   Ventricular tachycardia 12/30/2017   PAF (paroxysmal atrial fibrillation) ([place])    Dilated cardiomyopathy ([place])    Pollen allergy 02/17/2017   Dyslipidemia 02/17/2017  Hematochezia 11/08/2015   CKD (chronic kidney disease), stage III ([place]) 06/20/2015   Cardiac defibrillator in situ    Chronic systolic CHF (congestive heart failure) ([place]) 03/11/2015   Gouty arthritis 03/11/2015   Chest pain 12/28/2014   Acute renal failure superimposed on stage 3 chronic kidney disease ([place]) 04/08/2014   Aspirin allergy 04/08/2014   Renal cell carcinoma ([place])    Gout attack 11/10/2011   Morbid obesity ([place]) 10/23/2011   OSA (obstructive sleep apnea) 10/23/2011   Type 2 diabetes mellitus with stage 3 chronic kidney disease, with long-term current use of insulin ([place]) 10/22/2011   Hyperlipidemia associated with type 2 diabetes mellitus ([place]) 12/25/2010   Hypertension associated with diabetes ([place]) 12/25/2010    Conditions to be addressed/monitored per PCP order:   chronic healthcare management  needs,  CHF,Afib, HTN, CKD,OSA, Gout, DM2, h/o remal cell carcinoma  Care Plan : General Plan of Care (Adult)  Updates made by Jeffrey Medicus, RN since 08/19/2021 12:00 AM     Problem: Health Promotion or Disease Self-Management (General Plan of Care)   Priority: High  Onset Date: 09/24/2020  Note:   Current Barriers:  Chronic Disease Management support and education needs, CHF, HTN, CKD, OSA, gout, DM2, h/o renal cell carcinoma.  08/19/21:  Patient S/P LVAD 07/21/21.  In hospital 07-18-21 to 08-08-21.  In need of rent and utility assistance.  Awaiting HHPT and cardiac rehab to start-patient will follow up.  Nurse Case Manager Clinical Goal(s):  Over the next 90 days, patient will attend all scheduled medical appointments: Over the next 30 days, patient will meet with Pharmacist. Over the next 7 days, patient will receive resources to assist with rent and utilities.   Interventions:  Inter-disciplinary care team collaboration (see longitudinal plan of care) Collaborated with BSW regarding food stamps, disability updates, and childcare resources for voucher-completed. BSW referral for resources related to childcare expense-completed. Evaluation of current treatment plan and patient's adherence to plan as established by provider.  BSW contacted patient regarding daycare voucher and foodstamps. BSW informed patient he would have to contact DSS and have the child's name placed on the waitlist, patient can also apply for foodstamps at the same time. Patient reports no other resources needed at this time.  Reviewed medications with patient. Discussed plans with patient for ongoing care management follow up and provided patient with direct contact information for care management team Provided patient with educational materials related to exercise. Reviewed scheduled/upcoming provider appointments. Collaborated with Pharmacy Pharmacy referral for medication review.  Patient Goals/Self-Care  Activities Over the next 90 days, patient will:  -Attends all scheduled provider appointments Calls provider office for new concerns or questions  Follow Up Plan: The Managed Medicaid care management team will reach out to the patient again over the next 30 days.  The patient has been provided with contact information for the Managed Medicaid care management team and has been advised to call with any health related questions or concerns.    Follow Up:  Patient agrees to Care Plan and Follow-up.  Plan: The Managed Medicaid care management team will reach out to the patient again over the next 30 days. and The  Patient has been provided with contact information for the Managed Medicaid care management team and has been advised to call with any health related questions or concerns.  Date/time of next scheduled RN care management/care coordination outreach:  09/18/21 at 130.

## 2021-08-19 NOTE — Patient Instructions (Signed)
Hi Mr. Demartin good to talk to you today-I am glad you are doing so well!!  Mr. Rumpf was given information about Medicaid Managed Care team care coordination services as a part of their Healthy [facility] Medicaid benefit. [name] verbally consented to engagement with the [facility] Managed Care team.   If you are experiencing a medical emergency, please call 911 or report to your local emergency department or urgent care.   If you have a non-emergency medical problem during routine business hours, please contact your provider's office and ask to speak with a nurse.   For questions related to your Healthy [facility] health plan, please call: [phone] or visit the homepage here: [email or web address]  If you would like to schedule transportation through your Healthy [facility] plan, please call the following number at least 2 days in advance of your appointment: [phone]  Call the [place] at [phone], at any time, 24 hours a day, 7 days a week. If you are in danger or need immediate medical attention call 911.  If you would like help to quit smoking, call 1-800-QUIT-NOW 463-540-0445) OR Espaol: 1-855-Djelo-Ya (7-619-509-3267) o para ms informacin haga clic aqu or Text READY to 200-400 to register via text  Mr. Stjames - following are the goals we discussed in your visit today:   Goals Addressed             This Visit's Progress    Protect My Health       Timeframe:  Long-Range Goal Priority:  High Start Date:    09/24/20                         Expected End Date:  ongoing           - schedule recommended health tests  - schedule and keep appointment for annual check-up  Update 11/28/20:Patient recently discharged from hospital 11/26/20-follow up appointments scheduled. Update 12/27/20:  Patient with no complaints today-scheduled for colonoscopy 02/05/21-rectal bleeding. Update 01/22/21:  Patient attending all  scheduled appointments.  Annual check up completed 12/31/20 with Ms. Clovis Riley, NP. Update 04/28/21:  Patient has upcoming appointments scheduled with Neurology and Cardiology. Update 06/25/21:  Cysto scheduled for 07/10/21 and LVAD 07/21/21. Update 08/19/21:  Patient S/PLVAD 07/21/21, following up as directed.   The patient verbalized understanding of instructions provided today and declined a print copy of patient instruction materials.   The Managed Medicaid care management team will reach out to the patient again over the next 30 days.  The  Patient  has been provided with contact information for the Managed Medicaid care management team and has been advised to call with any health related questions or concerns.   [name] RN, BSN [place]  [facility] - Managed Medicaid High Risk [phone].    Following is a copy of your plan of care:  Care Plan : General Plan of Care (Adult)  Updates made by Gayla Medicus, RN since 08/19/2021 12:00 AM     Problem: Health Promotion or Disease Self-Management (General Plan of Care)   Priority: High  Onset Date: 09/24/2020  Note:   Current Barriers:  Chronic Disease Management support and education needs, CHF, HTN, CKD, OSA, gout, DM2, h/o renal cell carcinoma.  08/19/21:  Patient S/P LVAD 07/21/21.  In hospital 07-18-21 to 08-08-21.  In need of rent and utility assistance.  Awaiting HHPT and cardiac rehab to start-patient will follow up.  Nurse Case Manager Clinical Goal(s):  Over the next 90 days, patient will attend all scheduled medical appointments: Over the next 30 days, patient will meet with Pharmacist. Over the next 7 days, patient will receive resources to assist with rent and utilities.   Interventions:  Inter-disciplinary care team collaboration (see longitudinal plan of care) Collaborated with BSW regarding food stamps, disability updates, and childcare resources for  voucher-completed. BSW referral for resources related to childcare expense-completed. Evaluation of current treatment plan and patient's adherence to plan as established by provider.  BSW contacted patient regarding daycare voucher and foodstamps. BSW informed patient he would have to contact DSS and have the child's name placed on the waitlist, patient can also apply for foodstamps at the same time. Patient reports no other resources needed at this time.  Reviewed medications with patient. Discussed plans with patient for ongoing care management follow up and provided patient with direct contact information for care management team Provided patient with educational materials related to exercise. Reviewed scheduled/upcoming provider appointments. Collaborated with Pharmacy Pharmacy referral for medication review.  Patient Goals/Self-Care Activities Over the next 90 days, patient will:  -Attends all scheduled provider appointments Calls provider office for new concerns or questions  Follow Up Plan: The Managed Medicaid care management team will reach out to the patient again over the next 30 days.  The patient has been provided with contact information for the Managed Medicaid care management team and has been advised to call with any health related questions or concerns.

## 2021-08-20 ENCOUNTER — Encounter (HOSPITAL_COMMUNITY): Payer: Self-pay

## 2021-08-20 ENCOUNTER — Telehealth: Payer: Self-pay | Admitting: *Deleted

## 2021-08-20 ENCOUNTER — Ambulatory Visit (HOSPITAL_COMMUNITY)
Admission: RE | Admit: 2021-08-20 | Discharge: 2021-08-20 | Disposition: A | Discharge status: Home / Self Care | Payer: Medicaid Other | Source: Ambulatory Visit | Attending: Family Medicine | Admitting: Family Medicine

## 2021-08-20 ENCOUNTER — Ambulatory Visit (HOSPITAL_COMMUNITY): Payer: Self-pay | Admitting: Pharmacist

## 2021-08-20 VITALS — BP 76/0 | HR 76 | Temp 98.3°F | Ht 68.0 in | Wt 284.0 lb

## 2021-08-20 DIAGNOSIS — I428 Other cardiomyopathies: Secondary | ICD-10-CM | POA: Diagnosis not present

## 2021-08-20 DIAGNOSIS — I5022 Chronic systolic (congestive) heart failure: Secondary | ICD-10-CM | POA: Diagnosis not present

## 2021-08-20 DIAGNOSIS — Z95811 Presence of heart assist device: Secondary | ICD-10-CM | POA: Diagnosis not present

## 2021-08-20 DIAGNOSIS — Z85528 Personal history of other malignant neoplasm of kidney: Secondary | ICD-10-CM | POA: Diagnosis not present

## 2021-08-20 DIAGNOSIS — I130 Hypertensive heart and chronic kidney disease with heart failure and stage 1 through stage 4 chronic kidney disease, or unspecified chronic kidney disease: Secondary | ICD-10-CM | POA: Diagnosis not present

## 2021-08-20 DIAGNOSIS — G4733 Obstructive sleep apnea (adult) (pediatric): Secondary | ICD-10-CM | POA: Insufficient documentation

## 2021-08-20 DIAGNOSIS — I10 Essential (primary) hypertension: Secondary | ICD-10-CM

## 2021-08-20 DIAGNOSIS — M109 Gout, unspecified: Secondary | ICD-10-CM | POA: Insufficient documentation

## 2021-08-20 DIAGNOSIS — Z905 Acquired absence of kidney: Secondary | ICD-10-CM | POA: Diagnosis not present

## 2021-08-20 DIAGNOSIS — I480 Paroxysmal atrial fibrillation: Secondary | ICD-10-CM

## 2021-08-20 DIAGNOSIS — N1832 Chronic kidney disease, stage 3b: Secondary | ICD-10-CM | POA: Insufficient documentation

## 2021-08-20 DIAGNOSIS — Z7901 Long term (current) use of anticoagulants: Secondary | ICD-10-CM | POA: Diagnosis not present

## 2021-08-20 DIAGNOSIS — F17200 Nicotine dependence, unspecified, uncomplicated: Secondary | ICD-10-CM | POA: Insufficient documentation

## 2021-08-20 DIAGNOSIS — E1122 Type 2 diabetes mellitus with diabetic chronic kidney disease: Secondary | ICD-10-CM | POA: Insufficient documentation

## 2021-08-20 LAB — PROTIME-INR
INR: 2 — ABNORMAL HIGH (ref 0.8–1.2)
Prothrombin Time: 22.5 seconds — ABNORMAL HIGH (ref 11.4–15.2)

## 2021-08-20 LAB — CBC
HCT: 36.5 % — ABNORMAL LOW (ref 39.0–52.0)
Hemoglobin: 11.1 g/dL — ABNORMAL LOW (ref 13.0–17.0)
MCH: 23.2 pg — ABNORMAL LOW (ref 26.0–34.0)
MCHC: 30.4 g/dL (ref 30.0–36.0)
MCV: 76.4 fL — ABNORMAL LOW (ref 80.0–100.0)
Platelets: 225 10*3/uL (ref 150–400)
RBC: 4.78 MIL/uL (ref 4.22–5.81)
RDW: 19.5 % — ABNORMAL HIGH (ref 11.5–15.5)
WBC: 7.4 10*3/uL (ref 4.0–10.5)
nRBC: 0 % (ref 0.0–0.2)

## 2021-08-20 LAB — BASIC METABOLIC PANEL
Anion gap: 9 (ref 5–15)
BUN: 23 mg/dL — ABNORMAL HIGH (ref 6–20)
CO2: 19 mmol/L — ABNORMAL LOW (ref 22–32)
Calcium: 9.6 mg/dL (ref 8.9–10.3)
Chloride: 112 mmol/L — ABNORMAL HIGH (ref 98–111)
Creatinine, Ser: 1.5 mg/dL — ABNORMAL HIGH (ref 0.61–1.24)
GFR, Estimated: 58 mL/min — ABNORMAL LOW (ref >60)
Glucose, Bld: 119 mg/dL — ABNORMAL HIGH (ref 70–99)
Potassium: 4 mmol/L (ref 3.5–5.1)
Sodium: 140 mmol/L (ref 135–145)

## 2021-08-20 LAB — LACTATE DEHYDROGENASE: LDH: 162 U/L (ref 98–192)

## 2021-08-20 NOTE — Progress Notes (Signed)
LVAD INR 

## 2021-08-20 NOTE — Telephone Encounter (Signed)
   Telephone encounter was:  Successful.  08/20/2021 Name: Jeffrey [name] MRN: [id number] DOB: 1976-06-22  Jeffrey [name] is a 45 y.o. year old male who is a primary care patient of [place], Jake Church I, NP . The community resource team was consulted for assistance with No job , wife off work , the rent for [employer] , I sent them to [employer] ,and also a food stamp application along with information on food banks and to aspply for the [employer]  Care guide performed the following interventions: Patient provided with information about care guide support team and interviewed to confirm resource needs Follow up call placed to community resources to determine status of patients referral.  Follow Up Plan:  No further follow up planned at this time. The patient has been provided with needed resources.  [place], Care Management  [phone] [address] , [place] [address] Email : [email or web address]

## 2021-08-20 NOTE — Progress Notes (Addendum)
Patient presents for one week follow up in [facility] today with wife. Reports no problems with VAD equipment or concerns with drive line.  Patient walked in clinic without assistance. Provided patient with new battery clips today.   Pt confirms he stopped ASA as instructed last clinic visit.   Pt reports he is doing well at home, wife plans to re-start working next week. His daughter will be home with him for that week. He has transportation established with [employer] and will be using as needed.   He does report he had episode of dizziness last night.   He says he has a cough that started yesterday, and he took torsemide 20 mg for same. He had >100 PI events yesterday. He says he drinks less than 2 liters/day. Home weights have been 274 - 281 lbs. After assessment, Dr. [name] told patient to increase fluid/salt intake and not to take Torsemide. Pt verbalized understanding of same.   Pt reports home blood sugars have been "running low" 60 - 80. He says his PCP has managed his diabetes in the past. Advised patient to continue f/u with PCP for diabetes management.    Vital Signs:  Temp: 98.3 Doppler Pressure: 76  Automatc BP: 124/90 (108) HR: 76 NSR SPO2: 99% on RA   Weight: 284 lb w/ eqt Last weight:  291.6 lbs Home weights: 281 - 274 lbs   VAD Indication: Destination Therapy due to smoking and BMI   VAD interrogation & Equipment Management: Speed:6200 Flow: 4.9 Power:5.1 w    PI: 2.8   Alarms: no clinical alarms Events: >100 08/19/21  Fixed speed 6200 Low speed limit: 5900   Primary Controller:  Replace back up battery in 29 months. Back up controller:   Replace back up battery in 32 months.   Annual Equipment Maintenance on UBC/PM was performed on 07/21/21.    I reviewed the LVAD parameters from today and compared the results to the patient's prior recorded data. LVAD interrogation was NEGATIVE for significant power changes, NEGATIVE for clinical alarms  and POSITIVE for PI events/speed drops. No programming changes were made and pump is functioning within specified parameters. Pt is performing daily controller and system monitor self tests along with completing weekly and monthly maintenance for LVAD equipment.   LVAD equipment check completed and is in good working order. Back-up equipment present.    Exit Site Care: Drive line is being maintained every other day by [name] his wife. Existing VAD dressing removed and site care performed using sterile technique. Drive line exit site cleaned with Chlora prep applicators x 2, allowed to dry, and gauze dressing with Silver strip applied. Exit site healing and unincorporated, the velour is fully implanted at exit site. Small amount of serous drainage. No redness, tenderness, foul odor or rash noted. Pt has some skin irritation from the tape surrounding the driveline, but wife says it is better since using silk tape. Drive line anchor re-applied. May advance to twice weekly dressing changes. Pt given 4 anchors and box of adhesive remover for home use.    Device: BS Therapies: on at 250 BPM Last check: in hosp    BP & Labs:  Doppler 76 - Doppler is reflecting MAP   Hgb 11.1 - No S/S of bleeding. Specifically denies melena/BRBPR or nosebleeds.   LDH stable at 162 with established baseline of 160 - 250. Denies tea-colored urine. No power elevations noted on interrogation.   Plan:  Increase fluid and/or salt intake Do not  take Torsemide Advance dressing changes to twice weekly. Perform more often if drainage increases. Will need to keep dressing clean and dry. Return to [facility] in one week.  Lauren (PharmD) will call you with INR results and warfarin dosing.     Zada Girt RN [place] Coordinator    Office: [phone] 24/7 Emergency VAD Pager: [phone]

## 2021-08-20 NOTE — Progress Notes (Signed)
VAD CLINIC Follow-up Visit  PCP: Teena Dunk, NP HF MD: DB   HPI:  Jeffrey [name] is a 45 y.o. male with a history of  poorly controlled HTN, R renal cell carcinoma s/p nephrectomy, CKD IIIa, DM2, OSA, gout, morbid obesity and systolic HF due to NICM. S/p HM-3 VAD placement 07/21/21  Has [place]-[employer] S-ICD   HF dates back to 2012 with EF 25%   Admitted 7/22 with CHF/cardiogenic shock in setting AF with RVR. He was placed on amio gtt and underwent cardioversion. Echo EF < 20%, severe LV dilation, moderate RV dysfunction. Required NE and dobutamine, which were eventually weaned off. Hospitalization c/b by acute respiratory failure with hypoxia requiring mechanical ventilation. GDMT was limited by hypotension and VAD workup initiated. Creatinine peaked at 4.65 improved to 1.58 which is his baseline. He was discharged to CIR, weight 299 lbs.   He was presented at [facility] and discussed with [facility] transplant team and he was approved for DT VAD. Underwent HM3 LVAD by Dr. Cyndia Bent on 07/21/21. Had post-op AF requiring DC-CV.  Follow up for Heart Failure/LVAD:  Here with his wife. Continues to get stronger but over past day or two has noticed some dizziness. Says he is not drinking enough. Weight down 7 pounds in 1 week. Taking occasional torsemide. Denies edema, orthopnea or PND. No fevers, chills or problems with driveline. No bleeding, melena or neuro symptoms. No VAD alarms. Taking all meds as prescribed.    VAD Indication: Destination Therapy due to smoking and BMI   VAD interrogation & Equipment Management: Speed:6200 Flow: 4.9 Power:5.1 w    PI: 2.8   Alarms: no clinical alarms Events: >100 08/19/21  Fixed speed 6200 Low speed limit: 5900   Primary Controller:  Replace back up battery in 29 months. Back up controller:   Replace back up battery in 32 months.   Annual Equipment Maintenance on UBC/PM was performed on 07/21/21.    I reviewed the LVAD parameters from today and  compared the results to the patient's prior recorded data. LVAD interrogation was NEGATIVE for significant power changes, NEGATIVE for clinical alarms and POSITIVE for PI events/speed drops. No programming changes were made and pump is functioning within specified parameters. Pt is performing daily controller and system monitor self tests along with completing weekly and monthly maintenance for LVAD equipment.   Past Medical History:  Diagnosis Date   Anxiety    Aspirin allergy    Childhood asthma    Chronic systolic CHF (congestive heart failure) ([place])    a. EF 20-25% in 2012. b. EF 45-50% in 10/2011 with nonischemic nuc - presumed NICM. c. 12/2014 Echo: Sev depressed LV fxn, sev dil LV, mild LVH, mild MR, sev dil LA, mildly reduced RV fxn.   CKD (chronic kidney disease) stage 2, GFR 60-89 ml/min    H/O vasectomy 12/2019   High cholesterol    Hypertension    Morbid obesity ([place])    Nephrolithiasis    OSA on CPAP    Paroxysmal atrial fibrillation ([place])    Presumed NICM    a. 04/2014 Myoview: EF 26%, glob HK, sev glob HK, ? prior infarct;  b. Never cathed 2/2 CKD.   Renal cell carcinoma ([place])    a. s/p Rt robotic assisted partial converted to radical nephrectomy on 01/2013.   Troponin level elevated    a. 04/2014, 12/2014: felt due to CHF.   Type II diabetes mellitus ([facility])    Ventricular tachycardia  a. appropriate ICD therapy 12/2017    Current Outpatient Medications  Medication Sig Dispense Refill   acetaminophen (TYLENOL) 325 MG tablet Take 2 tablets (650 mg total) by mouth every 6 (six) hours as needed for fever.     allopurinol (ZYLOPRIM) 100 MG tablet Take 1 tablet (100 mg total) by mouth daily. 90 tablet 3   amiodarone (PACERONE) 200 MG tablet Take 1 tablet (200 mg total) by mouth daily. 30 tablet 6   colchicine 0.6 MG tablet Take 1 tablet (0.6 mg total) by mouth daily. 30 tablet 6   diclofenac Sodium (VOLTAREN) 1 % GEL Apply 2 g topically 4 (four) times daily. 100 g 0    gabapentin (NEURONTIN) 100 MG capsule Take 2 capsules (200 mg total) by mouth 3 (three) times daily. 180 capsule 6   insulin aspart protamine - aspart (NOVOLOG 70/30 MIX) (70-30) 100 UNIT/ML FlexPen Inject 38 Units into the skin 2 (two) times daily. 15 mL 11   Insulin Pen Needle (TRUEPLUS 5-BEVEL PEN NEEDLES) 29G X 12.7MM MISC Use as directed in the morning, at noon, and at bedtime. 100 each 3   Insulin Syringe-Needle U-100 (TRUEPLUS INSULIN SYRINGE) 31G X 5/16" 0.5 ML MISC USE TO INJECT INSULIN TWICE DAILY. 100 each 11   isosorbide-hydrALAZINE (BIDIL) 20-37.5 MG tablet Take 2 tablets by mouth 3 (three) times daily. 180 tablet 6   meclizine (ANTIVERT) 25 MG tablet Take 1 tablet (25 mg total) by mouth 3 (three) times daily as needed for dizziness. 30 tablet 0   metFORMIN (GLUCOPHAGE) 1000 MG tablet Take 1 tablet (1,000 mg total) by mouth 2 (two) times daily with a meal. 60 tablet 11   oxyCODONE (OXY IR/ROXICODONE) 5 MG immediate release tablet Take 1 tablet (5 mg total) by mouth every 8 (eight) hours as needed for severe pain. 15 tablet 0   pantoprazole (PROTONIX) 40 MG tablet Take 1 tablet (40 mg total) by mouth daily. 30 tablet 0   sacubitril-valsartan (ENTRESTO) 49-51 MG Take 1 tablet by mouth 2 (two) times daily. 60 tablet 6   sertraline (ZOLOFT) 25 MG tablet Take 1 tablet (25 mg total) by mouth daily. 30 tablet 3   spironolactone (ALDACTONE) 25 MG tablet Take 1 tablet (25 mg total) by mouth daily. 30 tablet 6   traMADol (ULTRAM) 50 MG tablet Take 1-2 tablets (50-100 mg total) by mouth every 4 (four) hours as needed for moderate pain. 30 tablet 0   traZODone (DESYREL) 100 MG tablet Take 1 tablet (100 mg total) by mouth at bedtime. 30 tablet 0   warfarin (COUMADIN) 4 MG tablet Take 1 tablet (4 mg total) by mouth daily at 4 PM. 45 tablet 6   docusate sodium (COLACE) 100 MG capsule Take 1 capsule (100 mg total) by mouth 2 (two) times daily. (Patient not taking: Reported on 08/20/2021) 10 capsule 0    enoxaparin (LOVENOX) 60 MG/0.6ML injection Inject 0.6 mLs (60 mg total) into the skin every 12 (twelve) hours. (Patient not taking: Reported on 08/20/2021) 6 mL 0   polyethylene glycol (MIRALAX / GLYCOLAX) 17 g packet Take 17 g by mouth daily. (Patient not taking: Reported on 08/20/2021) 14 each 0   No current facility-administered medications for this encounter.    Aspirin, Bee venom, Lisinopril, Shrimp (diagnostic), and Tomato  REVIEW OF SYSTEMS: All systems negative except as listed in HPI, PMH and Problem list.  I reviewed the LVAD parameters from today, and compared the results to the patient's prior recorded data.  No  programming changes were made.  The LVAD is functioning within specified parameters.  The patient performs LVAD self-test daily.  LVAD interrogation was negative for any significant power changes, alarms or PI events/speed drops.  LVAD equipment check completed and is in good working order.  Back-up equipment present.   LVAD education done on emergency procedures and precautions and reviewed exit site care.    Vitals:   08/20/21 1213 08/20/21 1214  BP: 124/90 (!) 76/0  Pulse: 76   Temp: 98.3 F (36.8 C)   TempSrc: Oral   SpO2: 99%   Weight: 128.8 kg (284 lb)   Height: 5\' 8"  (1.727 m)      Vital Signs:  Temp: 98.3 Doppler Pressure: 76   Automatc BP: 124/90 (108) HR: 76 NSR SPO2: 99% on RA   Weight: 284 lb w/ eqt Last weight:  291.6 lbs Home weights: 281 - 274 lbs   General:  NAD.  HEENT: normal  Neck: supple. JVP not elevated.  Carotids 2+ bilat; no bruits. No lymphadenopathy or thryomegaly appreciated. Cor: LVAD hum.  Lungs: Clear. Abdomen: obese soft, nontender, non-distended. No hepatosplenomegaly. No bruits or masses. Good bowel sounds. Driveline site clean. Anchor in place.  Extremities: no cyanosis, clubbing, rash. Warm no edema  Neuro: alert & oriented x 3. No focal deficits. Moves all 4 without problem   ASSESSMENT AND PLAN:   1. Chronic  systolic CHF - Nonischemic CM, end-stage - EF 2012; 20-25%  - Echo  8/22: EF < 20%, severe LV dilation, moderate RV dysfunction.   - RHC (8/22): well compensated hemodynamics. - s/p S-ICD - Not transplant candidate with BMI currently - S/p HM3 LVAD 10/17 (DT) - Continues to improve. NYHA II Volume status low. Hold diuretics. Encourage salt intake. - Continue Bidil 2 tabs tid, spironolactone 25 mg daily and entresto 49-51 mg BID.    2. VAD  - s/p HM-3 placement 07/21/21  - VAD interrogated personally. Parameters stable except for PI events related to volume depletion. - INR 2.0 Discussed dosing with PharmD personally. - On neurontin for pocket pain.  - LDH 162 - DL site ok   3 Atrial fibrillation: Paroxysmal - Had severe AF with RVR 7/22 s/p DC-CV x 2 - Seen by EP. Not a candidate for ablation due to body habitus  (could consider AVN ablation and CRT, if needed)  - Had post-op AF with RVR post VAD - 10/23 S/P DC-CV with restoration of NSR. - Remains in NSR. Continue po amio.    - On warfarin   4. HTN - MAPs up slightly will not push Entresto today with frequent PI events. Recehck next week. If still up can increase Entresto  5. CKD stage 3B  - Has solitary kidney.   - Baseline creatinine is 1.6-2.9.  - Creatinine 1.5  today.    6. OSA - Continue CPAP   7. DM2. Poorly controlled - On Jardiance. - Hgb A1c 9.4. - PCP following  8. Polyarticular gout. - quiescent  Total time spent 35 minutes. Over half that time spent discussing above.     Glori Bickers, MD  9:47 PM

## 2021-08-20 NOTE — Patient Instructions (Addendum)
Increase fluid and/or salt intake Do not take Torsemide Advance dressing changes to twice weekly. Perform more often if drainage increases. Will need to keep dressing clean and dry. Return to [facility] in one week.  Lauren (PharmD) will call you with INR results and warfarin dosing.

## 2021-08-21 ENCOUNTER — Other Ambulatory Visit: Payer: Self-pay

## 2021-08-21 ENCOUNTER — Other Ambulatory Visit: Payer: Self-pay | Admitting: Nurse Practitioner

## 2021-08-21 DIAGNOSIS — N183 Chronic kidney disease, stage 3 unspecified: Secondary | ICD-10-CM

## 2021-08-21 MED ORDER — ACCU-CHEK FASTCLIX LANCETS MISC
Freq: Four times a day (QID) | 11 refills | Status: DC
  Filled 2021-08-21: qty 102, 30d supply, fill #0

## 2021-08-22 ENCOUNTER — Other Ambulatory Visit: Payer: Self-pay

## 2021-08-25 ENCOUNTER — Telehealth (HOSPITAL_COMMUNITY): Payer: Self-pay | Admitting: Unknown Physician Specialty

## 2021-08-25 ENCOUNTER — Other Ambulatory Visit: Payer: Self-pay

## 2021-08-25 ENCOUNTER — Other Ambulatory Visit: Payer: Self-pay | Admitting: Nurse Practitioner

## 2021-08-25 DIAGNOSIS — E1122 Type 2 diabetes mellitus with diabetic chronic kidney disease: Secondary | ICD-10-CM

## 2021-08-25 MED ORDER — GABAPENTIN 300 MG PO CAPS
300.0000 mg | ORAL_CAPSULE | Freq: Three times a day (TID) | ORAL | 6 refills | Status: DC
  Filled 2021-08-25 – 2021-11-03 (×2): qty 90, 30d supply, fill #0
  Filled 2021-11-27: qty 90, 30d supply, fill #1
  Filled 2022-01-05: qty 90, 30d supply, fill #2

## 2021-08-25 MED FILL — Glucose Blood Test Strip: 25 days supply | Qty: 100 | Fill #0 | Status: AC

## 2021-08-25 NOTE — Telephone Encounter (Signed)
Received call from pt stating that he is having acute pain under his left breast that just started this morning. Pt states that the pain does not get worse when he takes a deep breath, and denies picking up anything heavy or any type of injury. Pt tells me that he is just took a Tramadol and is lying down. Pt states that he currently takes Gabapentin 200 mg tid. D/w Dr. [name], pt instructed to take an extra 200 mg of Gabapentin now and then to resume a dose of Gabapentin 300 mg tid. Pt verbalized understanding of all instructions given. I have sent a new script to the pts pharmacy. Pt has f/u with [place] in clinic on Wednesday.  Tanda Rockers RN, BSN VAD Coordinator 24/7 Pager [phone]

## 2021-08-26 ENCOUNTER — Other Ambulatory Visit: Payer: Self-pay

## 2021-08-26 ENCOUNTER — Other Ambulatory Visit (HOSPITAL_COMMUNITY): Payer: Self-pay | Admitting: *Deleted

## 2021-08-26 DIAGNOSIS — Z95811 Presence of heart assist device: Secondary | ICD-10-CM

## 2021-08-26 DIAGNOSIS — Z7901 Long term (current) use of anticoagulants: Secondary | ICD-10-CM

## 2021-08-27 ENCOUNTER — Ambulatory Visit (HOSPITAL_COMMUNITY)
Admission: RE | Admit: 2021-08-27 | Discharge: 2021-08-27 | Disposition: A | Discharge status: Home / Self Care | Payer: Medicaid Other | Source: Ambulatory Visit | Attending: Cardiology | Admitting: Cardiology

## 2021-08-27 ENCOUNTER — Encounter (HOSPITAL_COMMUNITY): Payer: Self-pay

## 2021-08-27 ENCOUNTER — Other Ambulatory Visit: Payer: Self-pay

## 2021-08-27 ENCOUNTER — Ambulatory Visit (HOSPITAL_COMMUNITY): Payer: Self-pay | Admitting: Pharmacist

## 2021-08-27 VITALS — BP 111/69 | HR 84 | Temp 98.3°F | Wt 284.6 lb

## 2021-08-27 DIAGNOSIS — I5022 Chronic systolic (congestive) heart failure: Secondary | ICD-10-CM | POA: Diagnosis not present

## 2021-08-27 DIAGNOSIS — I10 Essential (primary) hypertension: Secondary | ICD-10-CM | POA: Diagnosis not present

## 2021-08-27 DIAGNOSIS — E1122 Type 2 diabetes mellitus with diabetic chronic kidney disease: Secondary | ICD-10-CM | POA: Insufficient documentation

## 2021-08-27 DIAGNOSIS — Z79899 Other long term (current) drug therapy: Secondary | ICD-10-CM | POA: Diagnosis not present

## 2021-08-27 DIAGNOSIS — G4733 Obstructive sleep apnea (adult) (pediatric): Secondary | ICD-10-CM | POA: Insufficient documentation

## 2021-08-27 DIAGNOSIS — Z452 Encounter for adjustment and management of vascular access device: Secondary | ICD-10-CM | POA: Insufficient documentation

## 2021-08-27 DIAGNOSIS — Z905 Acquired absence of kidney: Secondary | ICD-10-CM | POA: Diagnosis not present

## 2021-08-27 DIAGNOSIS — M109 Gout, unspecified: Secondary | ICD-10-CM | POA: Diagnosis not present

## 2021-08-27 DIAGNOSIS — Z9989 Dependence on other enabling machines and devices: Secondary | ICD-10-CM | POA: Diagnosis not present

## 2021-08-27 DIAGNOSIS — F32A Depression, unspecified: Secondary | ICD-10-CM | POA: Diagnosis not present

## 2021-08-27 DIAGNOSIS — Z6841 Body Mass Index (BMI) 40.0 and over, adult: Secondary | ICD-10-CM | POA: Diagnosis not present

## 2021-08-27 DIAGNOSIS — Z95810 Presence of automatic (implantable) cardiac defibrillator: Secondary | ICD-10-CM | POA: Diagnosis not present

## 2021-08-27 DIAGNOSIS — I5084 End stage heart failure: Secondary | ICD-10-CM | POA: Insufficient documentation

## 2021-08-27 DIAGNOSIS — Z95811 Presence of heart assist device: Secondary | ICD-10-CM | POA: Diagnosis not present

## 2021-08-27 DIAGNOSIS — I130 Hypertensive heart and chronic kidney disease with heart failure and stage 1 through stage 4 chronic kidney disease, or unspecified chronic kidney disease: Secondary | ICD-10-CM | POA: Diagnosis present

## 2021-08-27 DIAGNOSIS — I480 Paroxysmal atrial fibrillation: Secondary | ICD-10-CM | POA: Diagnosis not present

## 2021-08-27 DIAGNOSIS — F419 Anxiety disorder, unspecified: Secondary | ICD-10-CM | POA: Insufficient documentation

## 2021-08-27 DIAGNOSIS — N1832 Chronic kidney disease, stage 3b: Secondary | ICD-10-CM | POA: Insufficient documentation

## 2021-08-27 DIAGNOSIS — Z85528 Personal history of other malignant neoplasm of kidney: Secondary | ICD-10-CM | POA: Diagnosis not present

## 2021-08-27 DIAGNOSIS — Z7901 Long term (current) use of anticoagulants: Secondary | ICD-10-CM | POA: Diagnosis not present

## 2021-08-27 LAB — CBC
HCT: 37.5 % — ABNORMAL LOW (ref 39.0–52.0)
Hemoglobin: 11.7 g/dL — ABNORMAL LOW (ref 13.0–17.0)
MCH: 23.5 pg — ABNORMAL LOW (ref 26.0–34.0)
MCHC: 31.2 g/dL (ref 30.0–36.0)
MCV: 75.5 fL — ABNORMAL LOW (ref 80.0–100.0)
Platelets: 341 10*3/uL (ref 150–400)
RBC: 4.97 MIL/uL (ref 4.22–5.81)
RDW: 19.7 % — ABNORMAL HIGH (ref 11.5–15.5)
WBC: 5.6 10*3/uL (ref 4.0–10.5)
nRBC: 0 % (ref 0.0–0.2)

## 2021-08-27 LAB — BASIC METABOLIC PANEL
Anion gap: 5 (ref 5–15)
BUN: 25 mg/dL — ABNORMAL HIGH (ref 6–20)
CO2: 23 mmol/L (ref 22–32)
Calcium: 9.9 mg/dL (ref 8.9–10.3)
Chloride: 113 mmol/L — ABNORMAL HIGH (ref 98–111)
Creatinine, Ser: 1.33 mg/dL — ABNORMAL HIGH (ref 0.61–1.24)
GFR, Estimated: >60 mL/min (ref >60)
Glucose, Bld: 110 mg/dL — ABNORMAL HIGH (ref 70–99)
Potassium: 4.3 mmol/L (ref 3.5–5.1)
Sodium: 141 mmol/L (ref 135–145)

## 2021-08-27 LAB — PROTIME-INR
INR: 1.9 — ABNORMAL HIGH (ref 0.8–1.2)
Prothrombin Time: 22.1 seconds — ABNORMAL HIGH (ref 11.4–15.2)

## 2021-08-27 LAB — LACTATE DEHYDROGENASE: LDH: 163 U/L (ref 98–192)

## 2021-08-27 MED ORDER — SERTRALINE HCL 50 MG PO TABS
50.0000 mg | ORAL_TABLET | Freq: Every day | ORAL | 6 refills | Status: DC
  Filled 2021-08-27: qty 30, 30d supply, fill #0

## 2021-08-27 NOTE — Progress Notes (Signed)
LVAD INR 

## 2021-08-27 NOTE — Patient Instructions (Signed)
Increase Zoloft to 50 mg daily. Updated prescription sent in to your pharmacy May take 1 extra dose of Gabapentin 300 mg daily if needed Coumadin dosing per Lauren PharmD Return to [facility] in 2 weeks

## 2021-08-27 NOTE — Progress Notes (Signed)
Patient presents for one week follow up in [facility] today with his sister. Reports no problems with VAD equipment or concerns with drive line.  Patient walked in clinic without assistance. Reports he has been feeling good since last visit. He has liberalized his diet, and is drinking more. Denies syncopal episodes, falls, or signs of bleeding. Reports mild lightheadedness/dizziness intermittently.   100+ PI events noted daily on VAD interrogation. PI 1.5 - 4.5. Per Dr [name] continue to liberalize diet. Pt verbalized understanding.   Pt called clinic on 11/21 complaining of sharp pocket pain. At that time Gabapentin was increased to 300 mg TID. Reports pain is improving with Gabapentin increase. Per Dr [name] he may take 1 additional dose of Gabapentin per day as needed. Pt verbalized understanding.   Pt reports his anxiety has been higher since leaving the hospital, and feels that Zoloft 25 mg is not working for him. Per Dr [name] will increase Zoloft to 50 mg daily. Prescription sent in to pharmacy.   Pt reports home blood sugars have been "running low" 60 - 80. He says his PCP has managed his diabetes in the past. Advised patient to continue f/u with PCP for diabetes management.    Vital Signs:  Temp: 98.3 Doppler Pressure: 84  Automatc BP: 111/69 (86) HR: 84 NSR SPO2: 98% on RA   Weight: 284.6 lb w/ eqt Last weight:  224 lbs Home weights: 281 - 274 lbs   VAD Indication: Destination Therapy due to smoking and BMI   VAD interrogation & Equipment Management: Speed: 6200 Flow: 5.3 Power: 5.1 w    PI: 2.0   Alarms: no clinical alarms Events: 100+ daily  Fixed speed 6200 Low speed limit: 5900   Primary Controller:  Replace back up battery in 29 months. Back up controller:   Replace back up battery in 32 months.   Annual Equipment Maintenance on UBC/PM was performed on 07/21/21.    I reviewed the LVAD parameters from today and compared the results to the patient's  prior recorded data. LVAD interrogation was NEGATIVE for significant power changes, NEGATIVE for clinical alarms and POSITIVE for PI events/speed drops. No programming changes were made and pump is functioning within specified parameters. Pt is performing daily controller and system monitor self tests along with completing weekly and monthly maintenance for LVAD equipment.   LVAD equipment check completed and is in good working order. Back-up equipment present.    Exit Site Care: Drive line is being maintained every other day by [name] his wife. Existing VAD dressing removed and site care performed using sterile technique. Drive line exit site cleaned with Chlora prep applicators x 2, allowed to dry, and gauze dressing with Silver strip applied. Exit site healing and unincorporated, the velour is fully implanted at exit site. Small amount of serous drainage. No redness, tenderness, foul odor or rash noted. Skin irritation is improving with use of silk tape. Drive line anchor re-applied. May continue twice weekly dressing changes. Pt given 7 daily kits with 6 anchors for home use.    Device: BS Therapies: on at 250 BPM Last check: in hosp    BP & Labs:  Doppler 84 - Doppler is reflecting MAP   Hgb 11.7 - No S/S of bleeding. Specifically denies melena/BRBPR or nosebleeds.   LDH stable at 163 with established baseline of 160 - 250. Denies tea-colored urine. No power elevations noted on interrogation.   Plan:  Increase Zoloft to 50 mg daily. Updated prescription sent in  to your pharmacy May take 1 extra dose of Gabapentin 300 mg daily if needed Coumadin dosing per Lauren PharmD Return to [place] clinic in 2 weeks   [name] RN [place] Coordinator  Office: [phone]  24/7 Pager: [phone]

## 2021-08-30 NOTE — Progress Notes (Addendum)
VAD CLINIC Follow-up Visit  PCP: Teena Dunk, NP HF MD: DB   HPI:  Jeffrey [name] is a 45 y.o. male with a history of  poorly controlled HTN, R renal cell carcinoma s/p nephrectomy, CKD IIIa, DM2, OSA, gout, morbid obesity and systolic HF due to NICM. S/p HM-3 VAD placement 07/21/21  Has [place]-[employer] S-ICD   HF dates back to 2012 with EF 25%   Admitted 7/22 with CHF/cardiogenic shock in setting AF with RVR. He was placed on amio gtt and underwent cardioversion. Echo EF < 20%, severe LV dilation, moderate RV dysfunction. Required NE and dobutamine, which were eventually weaned off. Hospitalization c/b by acute respiratory failure with hypoxia requiring mechanical ventilation. GDMT was limited by hypotension and VAD workup initiated. Creatinine peaked at 4.65 improved to 1.58 which is his baseline. He was discharged to CIR, weight 299 lbs.   He was presented at [facility] and discussed with [facility] transplant team and he was approved for DT VAD. Underwent HM3 LVAD by Dr. Cyndia Bent on 07/21/21. Had post-op AF requiring DC-CV.  Follow up for Heart Failure/LVAD:  At last visit was having some dizziness and frequent PIs. We suggested liberalizing salt and fluid intake. He has done better with this. No further dizziness but still with frequent PI events. No low flow alarms. Having some pocket pain and gabapentin increased. Feels his anxiety is getting worse. Denies orthopnea or PND. No fevers, chills or problems with driveline. No bleeding, melena or neuro symptoms. No VAD alarms. Taking all meds as prescribed.    VAD Indication: Destination Therapy due to smoking and BMI   VAD interrogation & Equipment Management: Speed: 6200 Flow: 5.3 Power: 5.1 w    PI: 2.0   Alarms: no clinical alarms Events: 100+ daily  Fixed speed 6200 Low speed limit: 5900   Primary Controller:  Replace back up battery in 29 months. Back up controller:   Replace back up battery in 32 months.   Annual Equipment  Maintenance on UBC/PM was performed on 07/21/21.    I reviewed the LVAD parameters from today and compared the results to the patient's prior recorded data. LVAD interrogation was NEGATIVE for significant power changes, NEGATIVE for clinical alarms and POSITIVE for PI events/speed drops. No programming changes were made and pump is functioning within specified parameters. Pt is performing daily controller and system monitor self tests along with completing weekly and monthly maintenance for LVAD equipment.   LVAD equipment check completed and is in good working order. Back-up equipment present.    Past Medical History:  Diagnosis Date   Anxiety    Aspirin allergy    Childhood asthma    Chronic systolic CHF (congestive heart failure) ([place])    a. EF 20-25% in 2012. b. EF 45-50% in 10/2011 with nonischemic nuc - presumed NICM. c. 12/2014 Echo: Sev depressed LV fxn, sev dil LV, mild LVH, mild MR, sev dil LA, mildly reduced RV fxn.   CKD (chronic kidney disease) stage 2, GFR 60-89 ml/min    H/O vasectomy 12/2019   High cholesterol    Hypertension    Morbid obesity ([place])    Nephrolithiasis    OSA on CPAP    Paroxysmal atrial fibrillation ([place])    Presumed NICM    a. 04/2014 Myoview: EF 26%, glob HK, sev glob HK, ? prior infarct;  b. Never cathed 2/2 CKD.   Renal cell carcinoma ([place])    a. s/p Rt robotic assisted partial converted  to radical nephrectomy on 01/2013.   Troponin level elevated    a. 04/2014, 12/2014: felt due to CHF.   Type II diabetes mellitus (HCC)    Ventricular tachycardia    a. appropriate ICD therapy 12/2017    Current Outpatient Medications  Medication Sig Dispense Refill   Accu-Chek FastClix Lancets MISC USE AS DIRECTED FOUR TIMES DAILY 102 each 11   allopurinol (ZYLOPRIM) 100 MG tablet Take 1 tablet (100 mg total) by mouth daily. 90 tablet 3   amiodarone (PACERONE) 200 MG tablet Take 1 tablet (200 mg total) by mouth daily. 30 tablet 6   colchicine 0.6 MG tablet Take 1  tablet (0.6 mg total) by mouth daily. 30 tablet 6   diclofenac Sodium (VOLTAREN) 1 % GEL Apply 2 g topically 4 (four) times daily. 100 g 0   gabapentin (NEURONTIN) 300 MG capsule Take 1 capsule (300 mg total) by mouth 3 (three) times daily. 90 capsule 6   glucose blood test strip Use as directed 4 times daily. 100 each 11   insulin aspart protamine - aspart (NOVOLOG 70/30 MIX) (70-30) 100 UNIT/ML FlexPen Inject 38 Units into the skin 2 (two) times daily. 15 mL 11   Insulin Pen Needle (TRUEPLUS 5-BEVEL PEN NEEDLES) 29G X 12.7MM MISC Use as directed in the morning, at noon, and at bedtime. 100 each 3   Insulin Syringe-Needle U-100 (TRUEPLUS INSULIN SYRINGE) 31G X 5/16" 0.5 ML MISC USE TO INJECT INSULIN TWICE DAILY. 100 each 11   isosorbide-hydrALAZINE (BIDIL) 20-37.5 MG tablet Take 2 tablets by mouth 3 (three) times daily. 180 tablet 6   metFORMIN (GLUCOPHAGE) 1000 MG tablet Take 1 tablet (1,000 mg total) by mouth 2 (two) times daily with a meal. 60 tablet 11   pantoprazole (PROTONIX) 40 MG tablet Take 1 tablet (40 mg total) by mouth daily. 30 tablet 0   sacubitril-valsartan (ENTRESTO) 49-51 MG Take 1 tablet by mouth 2 (two) times daily. 60 tablet 6   sertraline (ZOLOFT) 25 MG tablet Take 1 tablet (25 mg total) by mouth daily. 30 tablet 3   sertraline (ZOLOFT) 50 MG tablet Take 1 tablet (50 mg total) by mouth daily. 30 tablet 6   spironolactone (ALDACTONE) 25 MG tablet Take 1 tablet (25 mg total) by mouth daily. 30 tablet 6   traMADol (ULTRAM) 50 MG tablet Take 1-2 tablets (50-100 mg total) by mouth every 4 (four) hours as needed for moderate pain. 30 tablet 0   traZODone (DESYREL) 100 MG tablet Take 1 tablet (100 mg total) by mouth at bedtime. 30 tablet 0   warfarin (COUMADIN) 4 MG tablet Take 1 tablet (4 mg total) by mouth daily at 4 PM. 45 tablet 6   acetaminophen (TYLENOL) 325 MG tablet Take 2 tablets (650 mg total) by mouth every 6 (six) hours as needed for fever. (Patient not taking: Reported on  08/27/2021)     docusate sodium (COLACE) 100 MG capsule Take 1 capsule (100 mg total) by mouth 2 (two) times daily. (Patient not taking: Reported on 08/20/2021) 10 capsule 0   enoxaparin (LOVENOX) 60 MG/0.6ML injection Inject 0.6 mLs (60 mg total) into the skin every 12 (twelve) hours. (Patient not taking: Reported on 08/20/2021) 6 mL 0   meclizine (ANTIVERT) 25 MG tablet Take 1 tablet (25 mg total) by mouth 3 (three) times daily as needed for dizziness. (Patient not taking: Reported on 08/27/2021) 30 tablet 0   polyethylene glycol (MIRALAX / GLYCOLAX) 17 g packet Take 17 g by mouth  daily. (Patient not taking: Reported on 08/20/2021) 14 each 0   No current facility-administered medications for this encounter.    Aspirin, Bee venom, Lisinopril, Shrimp (diagnostic), and Tomato  REVIEW OF SYSTEMS: All systems negative except as listed in HPI, PMH and Problem list.  I reviewed the LVAD parameters from today, and compared the results to the patient's prior recorded data.  No programming changes were made.  The LVAD is functioning within specified parameters.  The patient performs LVAD self-test daily.  LVAD interrogation was negative for any significant power changes, alarms or PI events/speed drops.  LVAD equipment check completed and is in good working order.  Back-up equipment present.   LVAD education done on emergency procedures and precautions and reviewed exit site care.    Vitals:   08/27/21 1035 08/27/21 1036  BP: (!) 84/0 111/69  Pulse: 84   Temp: 98.3 F (36.8 C)   SpO2: 98%   Weight: 129.1 kg (284 lb 9.6 oz)      Vital Signs:  Temp: 98.3 Doppler Pressure: 84   Automatc BP: 111/69 (86) HR: 84 NSR SPO2: 98% on RA   Weight: 284.6 lb w/ eqt Last weight:  224 lbs Home weights: 281 - 274 lbs       General:  NAD.  HEENT: normal  Neck: supple. JVP not elevated.  Carotids 2+ bilat; no bruits. No lymphadenopathy or thryomegaly appreciated. Cor: LVAD hum.  Lungs:  Clear. Abdomen: obese soft, nontender, non-distended. No hepatosplenomegaly. No bruits or masses. Good bowel sounds. Driveline site clean. Anchor in place.  Extremities: no cyanosis, clubbing, rash. Warm no edema  Neuro: alert & oriented x 3. No focal deficits. Moves all 4 without problem    ASSESSMENT AND PLAN:   1. Chronic systolic CHF - Nonischemic CM, end-stage - EF 2012; 20-25%  - Echo  8/22: EF < 20%, severe LV dilation, moderate RV dysfunction.   - RHC (8/22): well compensated hemodynamics. - s/p S-ICD - S/p HM3 LVAD 10/17 (DT) - Continues to improve. NYHA II Volume status looks good. No need for diuretics. Continue to liberalize salt and fluid intake.  - Continue Bidil 2 tabs tid, spironolactone 25 mg daily and entresto 49-51 mg BID.  - Once BMI ~ 35 will refer to [facility] for begin transplant evaluation (Body mass index is 43.27 kg/m. Currently)   2. VAD  - s/p HM-3 placement 07/21/21  - VAD interrogated personally. Parameters stable.except for PI events - INR 1.9 Discussed dosing with PharmD personally. - On neurontin for pocket pain. Can increase as needed - LDH 163 - DL site ok - Hgb 11.7 (MCV low). Check iron stores at next visit    3 Atrial fibrillation: Paroxysmal - Had severe AF with RVR 7/22 s/p DC-CV x 2 - Seen by EP. Not a candidate for ablation due to body habitus  (could consider AVN ablation and CRT, if needed)  - Had post-op AF with RVR post VAD - 10/23 S/P DC-CV with restoration of NSR. - Remains in NSR. Continue amio - On warfarin   4. HTN - Blood pressure well controlled. Continue current regimen.  5. CKD stage 3B  - Has solitary kidney.   - Baseline creatinine is 1.6-2.9.  - Creatinine 1.33  today.    6. OSA - Continue CPAP   7. DM2. Poorly controlled - On Jardiance. - Hgb A1c 9.4. - PCP following. Recent CBGs have been low  8. Polyarticular gout. - quiescent  9. Depression/anxiety - increase Zoloft to  50mg  daily  Total time spent 45  minutes. Over half that time spent discussing above.     Glori Bickers, MD  7:57 PM

## 2021-09-01 DIAGNOSIS — F419 Anxiety disorder, unspecified: Secondary | ICD-10-CM | POA: Diagnosis not present

## 2021-09-01 DIAGNOSIS — M109 Gout, unspecified: Secondary | ICD-10-CM | POA: Diagnosis not present

## 2021-09-01 DIAGNOSIS — J9621 Acute and chronic respiratory failure with hypoxia: Secondary | ICD-10-CM | POA: Diagnosis not present

## 2021-09-01 DIAGNOSIS — N1831 Chronic kidney disease, stage 3a: Secondary | ICD-10-CM | POA: Diagnosis not present

## 2021-09-01 DIAGNOSIS — E1165 Type 2 diabetes mellitus with hyperglycemia: Secondary | ICD-10-CM | POA: Diagnosis not present

## 2021-09-01 DIAGNOSIS — I5023 Acute on chronic systolic (congestive) heart failure: Secondary | ICD-10-CM | POA: Diagnosis not present

## 2021-09-01 DIAGNOSIS — I428 Other cardiomyopathies: Secondary | ICD-10-CM | POA: Diagnosis not present

## 2021-09-01 DIAGNOSIS — I130 Hypertensive heart and chronic kidney disease with heart failure and stage 1 through stage 4 chronic kidney disease, or unspecified chronic kidney disease: Secondary | ICD-10-CM | POA: Diagnosis not present

## 2021-09-01 DIAGNOSIS — E1122 Type 2 diabetes mellitus with diabetic chronic kidney disease: Secondary | ICD-10-CM | POA: Diagnosis not present

## 2021-09-01 DIAGNOSIS — I480 Paroxysmal atrial fibrillation: Secondary | ICD-10-CM | POA: Diagnosis not present

## 2021-09-01 DIAGNOSIS — J9622 Acute and chronic respiratory failure with hypercapnia: Secondary | ICD-10-CM | POA: Diagnosis not present

## 2021-09-01 DIAGNOSIS — E7800 Pure hypercholesterolemia, unspecified: Secondary | ICD-10-CM | POA: Diagnosis not present

## 2021-09-01 DIAGNOSIS — J45909 Unspecified asthma, uncomplicated: Secondary | ICD-10-CM | POA: Diagnosis not present

## 2021-09-02 ENCOUNTER — Other Ambulatory Visit: Payer: Self-pay

## 2021-09-02 ENCOUNTER — Other Ambulatory Visit (HOSPITAL_COMMUNITY): Payer: Self-pay

## 2021-09-03 ENCOUNTER — Ambulatory Visit (INDEPENDENT_AMBULATORY_CARE_PROVIDER_SITE_OTHER): Payer: Medicaid Other

## 2021-09-03 DIAGNOSIS — I428 Other cardiomyopathies: Secondary | ICD-10-CM

## 2021-09-03 LAB — CUP PACEART REMOTE DEVICE CHECK
Battery Remaining Percentage: 86 %
Date Time Interrogation Session: 20221130095700
Implantable Lead Implant Date: 20160812
Implantable Lead Location: 753862
Implantable Lead Model: 3401
Implantable Pulse Generator Implant Date: 20210812
Pulse Gen Serial Number: 139514

## 2021-09-04 ENCOUNTER — Other Ambulatory Visit: Payer: Self-pay

## 2021-09-04 DIAGNOSIS — F419 Anxiety disorder, unspecified: Secondary | ICD-10-CM | POA: Diagnosis not present

## 2021-09-04 DIAGNOSIS — I480 Paroxysmal atrial fibrillation: Secondary | ICD-10-CM | POA: Diagnosis not present

## 2021-09-04 DIAGNOSIS — I428 Other cardiomyopathies: Secondary | ICD-10-CM | POA: Diagnosis not present

## 2021-09-04 DIAGNOSIS — E1165 Type 2 diabetes mellitus with hyperglycemia: Secondary | ICD-10-CM | POA: Diagnosis not present

## 2021-09-04 DIAGNOSIS — J9622 Acute and chronic respiratory failure with hypercapnia: Secondary | ICD-10-CM | POA: Diagnosis not present

## 2021-09-04 DIAGNOSIS — J9621 Acute and chronic respiratory failure with hypoxia: Secondary | ICD-10-CM | POA: Diagnosis not present

## 2021-09-04 DIAGNOSIS — I130 Hypertensive heart and chronic kidney disease with heart failure and stage 1 through stage 4 chronic kidney disease, or unspecified chronic kidney disease: Secondary | ICD-10-CM | POA: Diagnosis not present

## 2021-09-04 DIAGNOSIS — E1122 Type 2 diabetes mellitus with diabetic chronic kidney disease: Secondary | ICD-10-CM | POA: Diagnosis not present

## 2021-09-04 DIAGNOSIS — E7800 Pure hypercholesterolemia, unspecified: Secondary | ICD-10-CM | POA: Diagnosis not present

## 2021-09-04 DIAGNOSIS — I5023 Acute on chronic systolic (congestive) heart failure: Secondary | ICD-10-CM | POA: Diagnosis not present

## 2021-09-04 DIAGNOSIS — N1831 Chronic kidney disease, stage 3a: Secondary | ICD-10-CM | POA: Diagnosis not present

## 2021-09-04 DIAGNOSIS — M109 Gout, unspecified: Secondary | ICD-10-CM | POA: Diagnosis not present

## 2021-09-04 DIAGNOSIS — J45909 Unspecified asthma, uncomplicated: Secondary | ICD-10-CM | POA: Diagnosis not present

## 2021-09-04 NOTE — Patient Instructions (Signed)
Visit Information  Jeffrey [name] was given information about Medicaid Managed Care team care coordination services as a part of their Healthy [facility] Medicaid benefit. Jeffrey [name] verbally consented to engagement with the [facility] Managed Care team.   If you are experiencing a medical emergency, please call 911 or report to your local emergency department or urgent care.   If you have a non-emergency medical problem during routine business hours, please contact your provider's office and ask to speak with a nurse.   For questions related to your Healthy [facility] health plan, please call: [phone] or visit the homepage here: [email or web address]  If you would like to schedule transportation through your Healthy [facility] plan, please call the following number at least 2 days in advance of your appointment: [phone]  Call the [place] at [phone], at any time, 24 hours a day, 7 days a week. If you are in danger or need immediate medical attention call 911.  If you would like help to quit smoking, call 1-800-QUIT-NOW 309-385-8894) OR Espaol: 1-855-Djelo-Ya (4-680-321-2248) o para ms informacin haga clic aqu or Text READY to 200-400 to register via text  Jeffrey [name] - following are the goals we discussed in your visit today:   Goals Addressed   None      The  Patient                                              has been provided with contact information for the Managed Medicaid care management team and has been advised to call with any health related questions or concerns.   Jeffrey [name], BSW, [place]  High Risk Managed Medicaid Team  [phone]   Following is a copy of your plan of care:  Care Plan : General Plan of Care (Adult)  Updates made by Ethelda Chick since 09/04/2021 12:00 AM     Problem: Health Promotion or Disease Self-Management (General Plan of Care)    Priority: High  Onset Date: 09/24/2020  Note:   Current Barriers:  Chronic Disease Management support and education needs, CHF, HTN, CKD, OSA, gout, DM2, h/o renal cell carcinoma.  08/19/21:  Patient S/P LVAD 07/21/21.  In hospital 07-18-21 to 08-08-21.  In need of rent and utility assistance.  Awaiting HHPT and cardiac rehab to start-patient will follow up.  Nurse Case Manager Clinical Goal(s):  Over the next 90 days, patient will attend all scheduled medical appointments: Over the next 30 days, patient will meet with Pharmacist. Over the next 7 days, patient will receive resources to assist with rent and utilities.   Interventions:  Inter-disciplinary care team collaboration (see longitudinal plan of care) Collaborated with BSW regarding food stamps, disability updates, and childcare resources for voucher-completed. BSW referral for resources related to childcare expense-completed. Evaluation of current treatment plan and patient's adherence to plan as established by provider.  BSW contacted patient regarding daycare voucher and foodstamps. BSW informed patient he would have to contact DSS and have the child's name placed on the waitlist, patient can also apply for foodstamps at the same time. Patient reports no other resources needed at this time.  09/04/21: BSW spoke with patient, he stated he is not sure if they have been placed on the daycare voucher because his wife has not said anything to him yet. Patient  stated they have applied for foodstamps but have not received them, he thinks its because his wife makes to much money. No other resources are needed at this time. Reviewed medications with patient. Discussed plans with patient for ongoing care management follow up and provided patient with direct contact information for care management team Provided patient with educational materials related to exercise. Reviewed scheduled/upcoming provider appointments. Collaborated with  Pharmacy Pharmacy referral for medication review. Care guide referral for rent and power resources. Collaborated with Care Guide for financial resources.  Patient Goals/Self-Care Activities Over the next 90 days, patient will:  -Attends all scheduled provider appointments Calls provider office for new concerns or questions  Follow Up Plan: The Managed Medicaid care management team will reach out to the patient again over the next 30 days.  The patient has been provided with contact information for the Managed Medicaid care management team and has been advised to call with any health related questions or concerns.

## 2021-09-04 NOTE — Patient Outreach (Signed)
Medicaid Managed Care Social Work Note  09/04/2021 Name:  Jeffrey [name] MRN:  [id number] DOB:  09/25/1976  Jeffrey [name] is an 45 y.o. year old male who is a primary patient of [place], Jake Church I, NP.  The Medicaid Managed Care Coordination team was consulted for assistance with:  Community Resources   [name] was given information about Medicaid Managed Care Coordination team services today. Jeffrey [name] Patient agreed to services and verbal consent obtained.  Engaged with patient  for by telephone forfollow up visit in response to referral for case management and/or care coordination services.   Assessments/Interventions:  Review of past medical history, allergies, medications, health status, including review of consultants reports, laboratory and other test data, was performed as part of comprehensive evaluation and provision of chronic care management services.  SDOH: (Social Determinant of Health) assessments and interventions performed: BSW spoke with patient, he stated he is not sure if they have been placed on the daycare voucher because his wife has not said anything to him yet. Patient stated they have applied for foodstamps but have not received them, he thinks its because his wife makes to much money. No other resources are needed at this time.   Advanced Directives Status:  Not addressed in this encounter.  Care Plan                 Allergies  Allergen Reactions   Aspirin Shortness Of Breath, Itching and Rash     Burning sensation (Patient reports he tolerates other NSAIDS)    Bee Venom Hives and Swelling   Lisinopril Cough   Shrimp (Diagnostic) Rash   Tomato Rash    Medications Reviewed Today     Reviewed by Mertha Baars, RN (Registered Nurse) on 08/27/21 at 1035  Med List Status: <None>   Medication Order Taking? Sig Documenting Provider Last Dose Status Informant  Accu-Chek FastClix Lancets MISC [id number] Yes USE AS DIRECTED FOUR TIMES DAILY Vevelyn Francois,  NP Taking Active   acetaminophen (TYLENOL) 325 MG tablet [id number] No Take 2 tablets (650 mg total) by mouth every 6 (six) hours as needed for fever.  Patient not taking: Reported on 08/27/2021   Darrick Grinder D, NP Not Taking Active Self  allopurinol (ZYLOPRIM) 100 MG tablet [id number] Yes Take 1 tablet (100 mg total) by mouth daily. Cathlyn Parsons, PA-C Taking Active Self  amiodarone (PACERONE) 200 MG tablet [id number] Yes Take 1 tablet (200 mg total) by mouth daily. Darrick Grinder D, NP Taking Active   colchicine 0.6 MG tablet [id number] Yes Take 1 tablet (0.6 mg total) by mouth daily. Darrick Grinder D, NP Taking Active   diclofenac Sodium (VOLTAREN) 1 % GEL [id number] Yes Apply 2 g topically 4 (four) times daily. Izora Ribas, MD Taking Active Self  docusate sodium (COLACE) 100 MG capsule [id number] No Take 1 capsule (100 mg total) by mouth 2 (two) times daily.  Patient not taking: Reported on 08/20/2021   Darrick Grinder D, NP Not Taking Active Self  enoxaparin (LOVENOX) 60 MG/0.6ML injection [id number] No Inject 0.6 mLs (60 mg total) into the skin every 12 (twelve) hours.  Patient not taking: Reported on 08/20/2021   Bensimhon, Shaune Pascal, MD Not Taking Active   gabapentin (NEURONTIN) 300 MG capsule [id number] Yes Take 1 capsule (300 mg total) by mouth 3 (three) times daily. Bensimhon, Shaune Pascal, MD Taking Active   glucose blood test strip [id number] Yes Use as directed 4 times daily. Bo Merino I,  NP Taking Active   insulin aspart protamine - aspart (NOVOLOG 70/30 MIX) (70-30) 100 UNIT/ML FlexPen [id number] Yes Inject 38 Units into the skin 2 (two) times daily. Mertens, Maricela Bo, FNP Taking Active Self  Insulin Pen Needle (TRUEPLUS 5-BEVEL PEN NEEDLES) 29G X 12.7MM MISC [id number] Yes Use as directed in the morning, at noon, and at bedtime. Bo Merino I, NP Taking Active   Insulin Syringe-Needle U-100 (TRUEPLUS INSULIN SYRINGE) 31G X 5/16" 0.5 ML MISC [id number] Yes USE TO INJECT INSULIN TWICE  DAILY. Bo Merino I, NP Taking Active   isosorbide-hydrALAZINE (BIDIL) 20-37.5 MG tablet [id number] Yes Take 2 tablets by mouth 3 (three) times daily. Darrick Grinder D, NP Taking Active   meclizine (ANTIVERT) 25 MG tablet [id number] No Take 1 tablet (25 mg total) by mouth 3 (three) times daily as needed for dizziness.  Patient not taking: Reported on 08/27/2021   Darrick Grinder D, NP Not Taking Active   metFORMIN (GLUCOPHAGE) 1000 MG tablet [id number] Yes Take 1 tablet (1,000 mg total) by mouth 2 (two) times daily with a meal. [place], Maricela Bo, FNP Taking Active Self  pantoprazole (PROTONIX) 40 MG tablet [id number] Yes Take 1 tablet (40 mg total) by mouth daily. Cathlyn Parsons, PA-C Taking Active Self  polyethylene glycol (MIRALAX / GLYCOLAX) 17 g packet [id number] No Take 17 g by mouth daily.  Patient not taking: Reported on 08/20/2021   Cathlyn Parsons, PA-C Not Taking Active Self  sacubitril-valsartan (ENTRESTO) 49-51 MG [id number] Yes Take 1 tablet by mouth 2 (two) times daily. Clegg, Amy D, NP Taking Active   sertraline (ZOLOFT) 25 MG tablet [id number] Yes Take 1 tablet (25 mg total) by mouth daily. Bensimhon, Shaune Pascal, MD Taking Active Self  spironolactone (ALDACTONE) 25 MG tablet [id number] Yes Take 1 tablet (25 mg total) by mouth daily. Cathlyn Parsons, PA-C Taking Active Self  traMADol (ULTRAM) 50 MG tablet [id number] Yes Take 1-2 tablets (50-100 mg total) by mouth every 4 (four) hours as needed for moderate pain. Darrick Grinder D, NP Taking Active   traZODone (DESYREL) 100 MG tablet [id number] Yes Take 1 tablet (100 mg total) by mouth at bedtime. Cathlyn Parsons, PA-C Taking Active Self  warfarin (COUMADIN) 4 MG tablet [id number] Yes Take 1 tablet (4 mg total) by mouth daily at 4 PM. Bensimhon, Shaune Pascal, MD Taking Active             Patient Active Problem List   Diagnosis Date Noted   Presence of left ventricular assist device (LVAD) ([place]) 07/25/2021   Acute on chronic systolic CHF  (congestive heart failure) ([place]) 07/18/2021   Cardiogenic shock (HCC)    Atrial fibrillation with RVR ([place]) 04/30/2021   Acute hypoxemic respiratory failure (HCC)    Right wrist pain 04/16/2021   Absolute anemia    Rectal bleeding    Iron deficiency 12/25/2020   Long term current use of anticoagulant - apixaban 12/25/2020   Paroxysmal atrial fibrillation (HCC)    Lactic acidosis 11/22/2020   Hypotension 11/22/2020   Aspiration into airway    ICD (implantable cardioverter-defibrillator) battery depletion 05/15/2020   Endotracheal tube present    Respiratory failure ([place])    Acute encephalopathy    Acute pulmonary edema (HCC)    NICM (nonischemic cardiomyopathy) ([place]) 04/24/2020   S/P vasectomy 12/20/2019   Anxiety 12/20/2019   Hemoglobin A1C between 7% and 9% indicating borderline diabetic control ([place]) 12/20/2019   Seasonal allergies 12/20/2019   Insomnia 12/20/2019   Vasectomy evaluation  06/20/2019   Visual problems 06/20/2019   Blurry vision, bilateral 06/20/2019   Hyperglycemia 02/13/2019   Hyperosmolar (nonketotic) coma ([place]) 01/19/2019   AICD (automatic cardioverter/defibrillator) present 01/19/2019   Hyperlipidemia 12/07/2018   Follow-up exam 11/22/2018   Class 3 severe obesity due to excess calories with serious comorbidity and body mass index (BMI) of 45.0 to 49.9 in adult ([place]) 11/22/2018   Dizziness 11/22/2018   Lingular pneumonia 08/04/2018   Ventricular tachycardia 12/30/2017   PAF (paroxysmal atrial fibrillation) ([place])    Dilated cardiomyopathy ([place])    Pollen allergy 02/17/2017   Dyslipidemia 02/17/2017   Hematochezia 11/08/2015   CKD (chronic kidney disease), stage III ([place]) 06/20/2015   Cardiac defibrillator in situ    Chronic systolic CHF (congestive heart failure) ([place]) 03/11/2015   Gouty arthritis 03/11/2015   Chest pain 12/28/2014   Acute renal failure superimposed on stage 3 chronic kidney disease ([place]) 04/08/2014   Aspirin allergy 04/08/2014    Renal cell carcinoma ([place])    Gout attack 11/10/2011   Morbid obesity ([place]) 10/23/2011   OSA (obstructive sleep apnea) 10/23/2011   Type 2 diabetes mellitus with stage 3 chronic kidney disease, with long-term current use of insulin ([place]) 10/22/2011   Hyperlipidemia associated with type 2 diabetes mellitus ([place]) 12/25/2010   Hypertension associated with diabetes ([place]) 12/25/2010    Conditions to be addressed/monitored per PCP order:   DSS daycare voucher  Care Plan : General Plan of Care (Adult)  Updates made by Ethelda Chick since 09/04/2021 12:00 AM     Problem: Health Promotion or Disease Self-Management (General Plan of Care)   Priority: High  Onset Date: 09/24/2020  Note:   Current Barriers:  Chronic Disease Management support and education needs, CHF, HTN, CKD, OSA, gout, DM2, h/o renal cell carcinoma.  08/19/21:  Patient S/P LVAD 07/21/21.  In hospital 07-18-21 to 08-08-21.  In need of rent and utility assistance.  Awaiting HHPT and cardiac rehab to start-patient will follow up.  Nurse Case Manager Clinical Goal(s):  Over the next 90 days, patient will attend all scheduled medical appointments: Over the next 30 days, patient will meet with Pharmacist. Over the next 7 days, patient will receive resources to assist with rent and utilities.   Interventions:  Inter-disciplinary care team collaboration (see longitudinal plan of care) Collaborated with BSW regarding food stamps, disability updates, and childcare resources for voucher-completed. BSW referral for resources related to childcare expense-completed. Evaluation of current treatment plan and patient's adherence to plan as established by provider.  BSW contacted patient regarding daycare voucher and foodstamps. BSW informed patient he would have to contact DSS and have the child's name placed on the waitlist, patient can also apply for foodstamps at the same time. Patient reports no other resources needed at this time.   09/04/21: BSW spoke with patient, he stated he is not sure if they have been placed on the daycare voucher because his wife has not said anything to him yet. Patient stated they have applied for foodstamps but have not received them, he thinks its because his wife makes to much money. No other resources are needed at this time. Reviewed medications with patient. Discussed plans with patient for ongoing care management follow up and provided patient with direct contact information for care management team Provided patient with educational materials related to exercise. Reviewed scheduled/upcoming provider appointments. Collaborated with Pharmacy Pharmacy referral for medication review. Care guide referral for rent and power resources. Collaborated with Care Guide for financial resources.  Patient Goals/Self-Care Activities Over the next 90 days, patient will:  -Attends all scheduled provider appointments Calls provider office for new concerns or questions  Follow Up Plan: The Managed Medicaid care management team will reach out to the patient again over the next 30 days.  The patient has been provided with contact information for the Managed Medicaid care management team and has been advised to call with any health related questions or concerns.         Follow up:  Patient agrees to Care Plan and Follow-up.  Plan: The  Patient has been provided with contact information for the Managed Medicaid care management team and has been advised to call with any health related questions or concerns.    [name], BSW, [place] Managed Medicaid Team  [phone]

## 2021-09-05 ENCOUNTER — Other Ambulatory Visit: Payer: Self-pay

## 2021-09-08 ENCOUNTER — Other Ambulatory Visit: Payer: Self-pay

## 2021-09-08 ENCOUNTER — Telehealth (HOSPITAL_COMMUNITY): Payer: Self-pay | Admitting: *Deleted

## 2021-09-08 DIAGNOSIS — Z95811 Presence of heart assist device: Secondary | ICD-10-CM

## 2021-09-08 DIAGNOSIS — Z7901 Long term (current) use of anticoagulants: Secondary | ICD-10-CM

## 2021-09-08 MED ORDER — WARFARIN SODIUM 4 MG PO TABS
4.0000 mg | ORAL_TABLET | Freq: Every day | ORAL | 3 refills | Status: DC
  Filled 2021-09-08 (×2): qty 90, 90d supply, fill #0
  Filled 2021-09-08: qty 120, 120d supply, fill #0
  Filled 2021-11-21: qty 90, 90d supply, fill #0
  Filled 2021-11-21: qty 90, 90d supply, fill #1

## 2021-09-08 NOTE — Telephone Encounter (Signed)
Patient called [facility] pager Sunday night to report he thinks his wife "accidentally threw away my coumadin". He is requesting Rx be called to his local pharmacy Monday morning.   New Rx sent, updated Audry Riles, PharmD.  Zada Girt RN, [place] Coordinator [phone]

## 2021-09-09 ENCOUNTER — Other Ambulatory Visit: Payer: Self-pay

## 2021-09-09 ENCOUNTER — Ambulatory Visit: Payer: Medicaid Other | Admitting: Nurse Practitioner

## 2021-09-09 ENCOUNTER — Encounter: Payer: Self-pay | Admitting: Nurse Practitioner

## 2021-09-09 VITALS — BP 112/94 | HR 88 | Temp 98.1°F | Ht 67.0 in | Wt 287.0 lb

## 2021-09-09 DIAGNOSIS — Z6841 Body Mass Index (BMI) 40.0 and over, adult: Secondary | ICD-10-CM

## 2021-09-09 DIAGNOSIS — N183 Chronic kidney disease, stage 3 unspecified: Secondary | ICD-10-CM

## 2021-09-09 DIAGNOSIS — E1122 Type 2 diabetes mellitus with diabetic chronic kidney disease: Secondary | ICD-10-CM

## 2021-09-09 DIAGNOSIS — I10 Essential (primary) hypertension: Secondary | ICD-10-CM | POA: Diagnosis not present

## 2021-09-09 DIAGNOSIS — Z794 Long term (current) use of insulin: Secondary | ICD-10-CM | POA: Diagnosis not present

## 2021-09-09 NOTE — Progress Notes (Signed)
[place], [place]  [address] Phone:  [phone]   Fax:  [phone] Subjective:   Patient ID: WILTON THRALL, male    DOB: [birth year], 45 y.o.   MRN: [id number]  Chief Complaint  Patient presents with   Follow-up    Pt is here for FU. no issues or concerns at the moment   HPI [name] 45 y.o. male  has a past medical history of Anxiety, Aspirin allergy, Childhood asthma, Chronic systolic CHF (congestive heart failure) ([place]), CKD (chronic kidney disease) stage 2, GFR 60-89 ml/min, H/O vasectomy (12/2019), High cholesterol, Hypertension, Morbid obesity ([place]), Nephrolithiasis, OSA on CPAP, Paroxysmal atrial fibrillation ([place]), Presumed NICM, Renal cell carcinoma ([place]), Troponin level elevated, Type II diabetes mellitus ([place]), and Ventricular tachycardia. To the [facility] for follow up of chronic illness and s/p hospitalization.   Patient states that she has no complaints or concerns today. Has been compliant with all medications. Was hospitalized 1 mth ago for 1 mth after LVAD placement. States that he is currently on heart transplant wait list to be completed I nthe next 2-3 mths, but has had no complications from LVAD. Endorses intentional weight loss of 20+ lbs. Goes to the gym 2 days per wk completing cardio, watches portion sizes and carb intake. Drinks water regularly.  Blood glucose well managed and controlled at home. FBG 69-100 and afternoon BG 100-101. Verbalizes possibly changing diabetes medications.   Denies any fever. Denies any fatigue, chest pain, shortness of breath, HA or dizziness. Denies any blurred vision, numbness or tingling.   Past Medical History:  Diagnosis Date   Anxiety    Aspirin allergy    Childhood asthma    Chronic systolic CHF (congestive heart failure) ([place])    a. EF 20-25% in 2012. b. EF 45-50% in 10/2011 with nonischemic nuc - presumed NICM. c. 12/2014 Echo: Sev depressed LV fxn, sev dil LV, mild LVH, mild  MR, sev dil LA, mildly reduced RV fxn.   CKD (chronic kidney disease) stage 2, GFR 60-89 ml/min    H/O vasectomy 12/2019   High cholesterol    Hypertension    Morbid obesity ([place])    Nephrolithiasis    OSA on CPAP    Paroxysmal atrial fibrillation ([place])    Presumed NICM    a. 04/2014 Myoview: EF 26%, glob HK, sev glob HK, ? prior infarct;  b. Never cathed 2/2 CKD.   Renal cell carcinoma ([place])    a. s/p Rt robotic assisted partial converted to radical nephrectomy on 01/2013.   Troponin level elevated    a. 04/2014, 12/2014: felt due to CHF.   Type II diabetes mellitus (HCC)    Ventricular tachycardia    a. appropriate ICD therapy 12/2017    Past Surgical History:  Procedure Laterality Date   APPENDECTOMY  07/2004   BIOPSY  02/10/2021   Procedure: BIOPSY;  Surgeon: Gatha Mayer, MD;  Location: [facility];  Service: Endoscopy;;   CARDIAC CATHETERIZATION N/A 05/17/2015   Procedure: Right/Left Heart Cath and Coronary Angiography;  Surgeon: Jolaine Artist, MD;  Location: [place] CV LAB;  Service: Cardiovascular;  Laterality: N/A;   CARDIOVERSION N/A 04/30/2021   Procedure: CARDIOVERSION;  Surgeon: Larey Dresser, MD;  Location: [facility] ENDOSCOPY;  Service: Cardiovascular;  Laterality: N/A;   CARDIOVERSION N/A 07/27/2021   Procedure: CARDIOVERSION;  Surgeon: Jolaine Artist, MD;  Location: [place];  Service: Cardiovascular;  Laterality: N/A;   COLONOSCOPY  WITH PROPOFOL N/A 02/10/2021   Procedure: COLONOSCOPY WITH PROPOFOL;  Surgeon: Gatha Mayer, MD;  [facility];  Service: Endoscopy;  Laterality: N/A;   EP IMPLANTABLE DEVICE N/A 05/17/2015   Procedure: SubQ ICD Implant;  Surgeon: Will Meredith Leeds, MD;  Location: [place] CV LAB;  Service: Cardiovascular;  Laterality: N/A;   ESOPHAGOGASTRODUODENOSCOPY (EGD) WITH PROPOFOL N/A 02/10/2021   Procedure: ESOPHAGOGASTRODUODENOSCOPY (EGD) WITH PROPOFOL;  Surgeon: Gatha Mayer, MD;  [facility];  Service:  Endoscopy;  Laterality: N/A;   INSERTION OF IMPLANTABLE LEFT VENTRICULAR ASSIST DEVICE N/A 07/21/2021   Procedure: INSERTION OF IMPLANTABLE LEFT VENTRICULAR ASSIST DEVICE;  Surgeon: Gaye Pollack, MD;  Location: [place];  Service: Open Heart Surgery;  Laterality: N/A;   RIGHT HEART CATH N/A 05/13/2021   Procedure: RIGHT HEART CATH;  Surgeon: Jolaine Artist, MD;  Location: [place] CV LAB;  Service: Cardiovascular;  Laterality: N/A;   RIGHT HEART CATH N/A 07/18/2021   Procedure: RIGHT HEART CATH;  Surgeon: Jolaine Artist, MD;  Location: [place] [facility] LAB;  Service: Cardiovascular;  Laterality: N/A;   ROBOTIC ASSISTED LAPAROSCOPIC LYSIS OF ADHESION  01/13/2013   Procedure: ROBOTIC ASSISTED LAPAROSCOPIC LYSIS OF ADHESION EXTENSIVE;  Surgeon: Alexis Frock, MD;  [facility];  Service: Urology;;   ROBOTIC ASSITED PARTIAL NEPHRECTOMY Right 01/13/2013   Procedure: ROBOTIC ASSITED PARTIAL NEPHRECTOMY CONVERTED TO ROBOTIC ASSISTED RIGHT RADICAL NEPHRECTOMY;  Surgeon: Alexis Frock, MD;  [facility];  Service: Urology;  Laterality: Right;   SUBQ ICD CHANGEOUT N/A 05/16/2020   Procedure: SUBQ ICD CHANGEOUT;  Surgeon: [name], MD;  Location: [place] [facility];  Service: Cardiovascular;  Laterality: N/A;   SUBQ ICD CHANGEOUT N/A 05/15/2020   Procedure: SUBQ ICD CHANGEOUT;  Surgeon: [name], MD;  Location: [place] [facility];  Service: Cardiovascular;  Laterality: N/A;   TEE WITHOUT CARDIOVERSION N/A 04/30/2021   Procedure: TRANSESOPHAGEAL ECHOCARDIOGRAM (TEE);  Surgeon: Larey Dresser, MD;  Location: [facility] ENDOSCOPY;  Service: Cardiovascular;  Laterality: N/A;   TEE WITHOUT CARDIOVERSION N/A 07/21/2021   Procedure: TRANSESOPHAGEAL ECHOCARDIOGRAM (TEE);  Surgeon: Gaye Pollack, MD;  Location: [place];  Service: Open Heart Surgery;  Laterality: N/A;   VASECTOMY      Family History  Problem Relation Age of Onset   Hypertension Mother    Diabetes Maternal Aunt    Heart  attack Neg Hx    Stroke Neg Hx    Colon cancer Neg Hx    Pancreatic cancer Neg Hx    Stomach cancer Neg Hx    Liver cancer Neg Hx    Esophageal cancer Neg Hx     Social History   Socioeconomic History   Marital status: Married    Spouse name: Pau Banh   Number of children: 13   Years of education: Not on file   Highest education level: Not on file  Occupational History   Not on file  Tobacco Use   Smoking status: Former    Packs/day: 0.25    Years: 22.00    Pack years: 5.50    Types: Cigarettes    Quit date: 04/30/2015    Years since quitting: 6.3   Smokeless tobacco: Never  Vaping Use   Vaping Use: Never used  Substance and Sexual Activity   Alcohol use: Not Currently    Comment: none   Drug use: Not Currently    Frequency: 14.0 times per week    Types: Marijuana  Comment: no longer smoking   Sexual activity: Not Currently    Birth control/protection: Condom  Other Topics Concern   Not on file  Social History Narrative   Married - 37 kids 33 boys 7 girls many are adults, 2 young children with current wife   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   Social Determinants of Health   Financial Resource Strain: High Risk   Difficulty of Paying Living Expenses: Very hard  Food Insecurity: Landscape architect Present   Worried About Charity fundraiser in the Last Year: Sometimes true   Arboriculturist in the Last Year: Sometimes true  Transportation Needs: Unmet Transportation Needs   Lack of Transportation (Medical): Yes   Lack of Transportation (Non-Medical): Yes  Physical Activity: Inactive   Days of Exercise per Week: 0 days   Minutes of Exercise per Session: 0 min   Stress: No Stress Concern Present   Feeling of Stress : Only a little  Social Connections: Moderately Integrated   Frequency of Communication with Friends and Family: More than three times a week   Frequency of Social Gatherings with Friends and Family: More than three times a week   Attends Religious Services: 1 to 4 times per year   Active Member of [employer] or Organizations: No   Attends Archivist Meetings: Never   Marital Status: Married  Human resources officer Violence: Not At Risk   Fear of Current or Ex-Partner: No   Emotionally Abused: No   Physically Abused: No   Sexually Abused: No    Outpatient Medications Prior to Visit  Medication Sig Dispense Refill   Accu-Chek FastClix Lancets MISC USE AS DIRECTED FOUR TIMES DAILY 102 each 11   acetaminophen (TYLENOL) 325 MG tablet Take 2 tablets (650 mg total) by mouth every 6 (six) hours as needed for fever.     allopurinol (ZYLOPRIM) 100 MG tablet Take 1 tablet (100 mg total) by mouth daily. 90 tablet 3   amiodarone (PACERONE) 200 MG tablet Take 1 tablet (200 mg total) by mouth daily. 30 tablet 6   diclofenac Sodium (VOLTAREN) 1 % GEL Apply 2 g topically 4 (four) times daily. 100 g 0   enoxaparin (LOVENOX) 60 MG/0.6ML injection Inject 0.6 mLs (60 mg total) into the skin every 12 (twelve) hours. 6 mL 0   gabapentin (NEURONTIN) 300 MG capsule Take 1 capsule (300 mg total) by mouth 3 (three) times daily. 90 capsule 6   glucose blood test strip Use as directed 4 times daily. 100 each 11   insulin aspart protamine - aspart (NOVOLOG 70/30 MIX) (70-30) 100 UNIT/ML FlexPen Inject 38 Units into the skin 2 (two) times daily. 15 mL 11   Insulin Pen Needle (TRUEPLUS 5-BEVEL PEN NEEDLES) 29G X 12.7MM MISC Use as directed in the morning, at noon, and at bedtime. 100 each 3   Insulin Syringe-Needle U-100 (TRUEPLUS INSULIN SYRINGE) 31G X 5/16" 0.5 ML MISC USE TO INJECT INSULIN TWICE DAILY. 100 each 11   isosorbide-hydrALAZINE (BIDIL) 20-37.5 MG  tablet Take 2 tablets by mouth 3 (three) times daily. 180 tablet 6   meclizine (ANTIVERT) 25 MG tablet Take 1 tablet (25  mg total) by mouth 3 (three) times daily as needed for dizziness. 30 tablet 0   metFORMIN (GLUCOPHAGE) 1000 MG tablet Take 1 tablet (1,000 mg total) by mouth 2 (two) times daily with a meal. 60 tablet 11   pantoprazole (PROTONIX) 40 MG tablet Take 1 tablet (40 mg total) by mouth daily. 30 tablet 0   sacubitril-valsartan (ENTRESTO) 49-51 MG Take 1 tablet by mouth 2 (two) times daily. 60 tablet 6   sertraline (ZOLOFT) 25 MG tablet Take 1 tablet (25 mg total) by mouth daily. 30 tablet 3   sertraline (ZOLOFT) 50 MG tablet Take 1 tablet (50 mg total) by mouth daily. 30 tablet 6   spironolactone (ALDACTONE) 25 MG tablet Take 1 tablet (25 mg total) by mouth daily. 30 tablet 6   traMADol (ULTRAM) 50 MG tablet Take 1-2 tablets (50-100 mg total) by mouth every 4 (four) hours as needed for moderate pain. 30 tablet 0   traZODone (DESYREL) 100 MG tablet Take 1 tablet (100 mg total) by mouth at bedtime. 30 tablet 0   warfarin (COUMADIN) 4 MG tablet Take 1 tablet (4 mg total) by mouth daily at 4 PM. 120 tablet 3   colchicine 0.6 MG tablet Take 1 tablet (0.6 mg total) by mouth daily. 30 tablet 6   docusate sodium (COLACE) 100 MG capsule Take 1 capsule (100 mg total) by mouth 2 (two) times daily. (Patient not taking: Reported on 09/09/2021) 10 capsule 0   polyethylene glycol (MIRALAX / GLYCOLAX) 17 g packet Take 17 g by mouth daily. (Patient not taking: Reported on 09/09/2021) 14 each 0   No facility-administered medications prior to visit.    Allergies  Allergen Reactions   Aspirin Shortness Of Breath, Itching and Rash     Burning sensation (Patient reports he tolerates other NSAIDS)    Bee Venom Hives and Swelling   Lisinopril Cough   Shrimp (Diagnostic) Rash   Tomato Rash    Review of Systems  Constitutional:  Negative for chills, fever and malaise/fatigue.  HENT: Negative.    Eyes:  Negative.   Respiratory:  Negative for cough and shortness of breath.   Cardiovascular:  Negative for chest pain, palpitations and leg swelling.  Gastrointestinal:  Negative for abdominal pain, blood in stool, constipation, diarrhea, nausea and vomiting.  Genitourinary: Negative.   Musculoskeletal: Negative.   Skin: Negative.   Neurological: Negative.   Psychiatric/Behavioral:  Negative for depression. The patient is not nervous/anxious.   All other systems reviewed and are negative.     Objective:    Physical Exam Vitals reviewed.  Constitutional:      General: He is not in acute distress.    Appearance: Normal appearance. He is obese.  HENT:     Head: Normocephalic.  Cardiovascular:     Rate and Rhythm: Normal rate and regular rhythm.     Pulses: Normal pulses.     Heart sounds: Normal heart sounds.     Comments: No obvious peripheral edema Pulmonary:     Effort: Pulmonary effort is normal.     Breath sounds: Normal breath sounds.  Musculoskeletal:        General: Normal range of motion.  Skin:    General: Skin is warm and dry.     Capillary Refill: Capillary refill takes less than 2 seconds.  Neurological:     General: No focal deficit present.     Mental Status: He is alert and oriented to person, place, and time.  Psychiatric:  Mood and Affect: Mood normal.        Behavior: Behavior normal.        Thought Content: Thought content normal.        Judgment: Judgment normal.    BP (!) 112/94   Pulse 88   Temp 98.1 F (36.7 C)   Ht 5\' 7"  (1.702 m)   Wt 287 lb (130.2 kg)   SpO2 97%   BMI 44.95 kg/m  Wt Readings from Last 3 Encounters:  09/09/21 287 lb (130.2 kg)  08/27/21 284 lb 9.6 oz (129.1 kg)  08/20/21 284 lb (128.8 kg)    Immunization History  Administered Date(s) Administered   Influenza Split 10/24/2011   Influenza,inj,Quad PF,6+ Mos 07/23/2017, 09/06/2018, 09/18/2019   Pneumococcal Conjugate-13 11/01/2017   Pneumococcal Polysaccharide-23  10/24/2011   Tdap 04/07/2015    Diabetic Foot Exam - Simple   No data filed     Lab Results  Component Value Date   TSH 2.564 08/13/2021   Lab Results  Component Value Date   WBC 5.6 08/27/2021   HGB 11.7 (L) 08/27/2021   HCT 37.5 (L) 08/27/2021   MCV 75.5 (L) 08/27/2021   PLT 341 08/27/2021   Lab Results  Component Value Date   NA 141 08/27/2021   K 4.3 08/27/2021   CO2 23 08/27/2021   GLUCOSE 110 (H) 08/27/2021   BUN 25 (H) 08/27/2021   CREATININE 1.33 (H) 08/27/2021   BILITOT 0.7 07/24/2021   ALKPHOS 72 07/24/2021   AST 26 07/24/2021   ALT 13 07/24/2021   PROT 6.5 07/24/2021   ALBUMIN 2.5 (L) 07/24/2021   CALCIUM 9.9 08/27/2021   ANIONGAP 5 08/27/2021   GFR 58.42 (L) 01/22/2015   Lab Results  Component Value Date   CHOL 174 06/10/2021   CHOL 149 05/12/2021   CHOL 188 10/28/2020   Lab Results  Component Value Date   HDL 37 (L) 06/10/2021   HDL 31 (L) 05/12/2021   HDL 37 (L) 10/28/2020   Lab Results  Component Value Date   LDLCALC 115 (H) 06/10/2021   LDLCALC 94 05/12/2021   LDLCALC 107 (H) 10/28/2020   Lab Results  Component Value Date   TRIG 120 06/10/2021   TRIG 120 05/12/2021   TRIG 148 05/04/2021   Lab Results  Component Value Date   CHOLHDL 4.7 06/10/2021   CHOLHDL 4.8 05/12/2021   CHOLHDL 5.1 (H) 10/28/2020   Lab Results  Component Value Date   HGBA1C 5.9 (H) 07/20/2021   HGBA1C 7.6 (H) 06/10/2021   HGBA1C 9.4 (H) 04/30/2021       Assessment & Plan:   Problem List Items Addressed This Visit       Endocrine   Type 2 diabetes mellitus with stage 3 chronic kidney disease, with long-term current use of insulin (HCC) - Primary (Chronic)   Other Visit Diagnoses     Class 3 severe obesity due to excess calories with serious comorbidity and body mass index (BMI) of 50.0 to 59.9 in adult [facility])       Essential hypertension      B/P and diabetes at goal, no changes in medications today  Encouraged continued diet and exercise  efforts  Encouraged continued compliance with medication  Informed to maintain upcoming follow up with specialist    Follow up in 3 mths for reevaluation of chronic illness and medications, sooner as needed.     I am having Stevens L. Knies maintain his acetaminophen, docusate sodium, allopurinol, pantoprazole, spironolactone,  traZODone, polyethylene glycol, diclofenac Sodium, metFORMIN, insulin aspart protamine - aspart, sertraline, amiodarone, traMADol, isosorbide-hydrALAZINE, sacubitril-valsartan, meclizine, colchicine, enoxaparin, Insulin Syringe-Needle U-100, TRUEplus 5-Bevel Pen Needles, Accu-Chek FastClix Lancets, glucose blood, gabapentin, sertraline, and warfarin.  No orders of the defined types were placed in this encounter.    Teena Dunk, NP

## 2021-09-09 NOTE — Patient Instructions (Signed)
You were seen today in the [facility] for reevaluation of chronic illness. Labs were collected, results will be available via MyChart or, if abnormal, you will be contacted by clinic staff. Please follow up in 3 mths for reevaluation diabetes, hypertension and medications.

## 2021-09-10 ENCOUNTER — Other Ambulatory Visit (HOSPITAL_COMMUNITY): Payer: Self-pay | Admitting: Unknown Physician Specialty

## 2021-09-10 ENCOUNTER — Other Ambulatory Visit: Payer: Self-pay

## 2021-09-10 DIAGNOSIS — Z95811 Presence of heart assist device: Secondary | ICD-10-CM

## 2021-09-10 DIAGNOSIS — Z7901 Long term (current) use of anticoagulants: Secondary | ICD-10-CM

## 2021-09-11 ENCOUNTER — Encounter (HOSPITAL_COMMUNITY): Payer: Medicaid Other

## 2021-09-11 NOTE — Progress Notes (Signed)
Remote ICD transmission.   

## 2021-09-12 ENCOUNTER — Other Ambulatory Visit: Payer: Self-pay

## 2021-09-12 ENCOUNTER — Other Ambulatory Visit (HOSPITAL_COMMUNITY): Payer: Self-pay

## 2021-09-12 NOTE — Patient Instructions (Signed)
Visit Information  Jeffrey [name] was given information about Medicaid Managed Care team care coordination services as a part of their Healthy [facility] Medicaid benefit. [name] verbally consented to engagement with the [facility] Managed Care team.   If you are experiencing a medical emergency, please call 911 or report to your local emergency department or urgent care.   If you have a non-emergency medical problem during routine business hours, please contact your provider's office and ask to speak with a nurse.   For questions related to your Healthy [facility] health plan, please call: [phone] or visit the homepage here: [email or web address]  If you would like to schedule transportation through your Healthy [facility] plan, please call the following number at least 2 days in advance of your appointment: [phone]  Call the [place] at [phone], at any time, 24 hours a day, 7 days a week. If you are in danger or need immediate medical attention call 911.  If you would like help to quit smoking, call 1-800-QUIT-NOW 323-152-4313) OR Espaol: 1-855-Djelo-Ya (3-536-144-3154) o para ms informacin haga clic aqu or Text READY to 200-400 to register via text  Mr. Ollis - following are the goals we discussed in your visit today:   Goals Addressed   None     The patient verbalized understanding of instructions provided today and declined a print copy of patient instruction materials.   RN Care Manager will call on 09/18/22 Pharmacist will call on 10/24/21 Next PCP appointment:  12/08/21  Hughes Better PharmD, CPP High Risk Managed Medicaid [place] [phone]   Following is a copy of your plan of care:   Care Plan : General Pharmacy (Adult)  Updates made by Hughes Better, RPH-CPP since 09/12/2021 12:00 AM     Problem: Chronic Disease Management      Long-Range Goal: Managing Chronic Disease Therapies    Start Date: 09/12/2021  Expected End Date: 12/11/2021  This Visit's Progress: On track  Priority: High  Note:   Current Barriers:  Suboptimal therapeutic regimen for T2DM  Pharmacist Clinical Goal(s):  patient will maintain control of T2DM, HTN, and HF as evidenced by regular follow-up with providers  through collaboration with PharmD and provider.    Interventions: Inter-disciplinary care team collaboration (see longitudinal plan of care) Comprehensive medication review performed; medication list updated in electronic medical record  Diabetes:  Controlled; current treatment: Novolog 70/30 38 units twice daily, metformin 1000mg  twice daily  Current glucose readings: fasting glucose: 65-100, post prandial glucose: 90-100; Reports no readings above 100  Denies hypoglycemic/hyperglycemic symptoms  Recommended patient continue to check blood glucose three times a day       Collaborated with PCP to recommend GLP1 (Ozempic or Trulicity) and reduction in patient's Novolog dose  Hypertension:  Controlled; current treatment: BIDIL 20-37.5mg  2 tablets three times daily, Entresto 49-51mg  twice daily, spironolactone 25mg  daily  Current home readings: 110/89  Denies hypotensive/hypertensive symptoms  Recommended patient check blood pressure 2-3/week  Heart Failure       Controlled; current treatment: BIDIL 20-37.5mg  2 tablets three times daily, Entresto 49-51mg  twice daily, spironolactone 25mg  daily  Pain (left thigh incision post surgery)      Mostly controlled; current treatment: Tylenol OTC (previously taking Tramadol which patient reports working better than Tylenol)      Rates pain 8/10 without Tylenol and with Tylenol 5-6      Recommended patient discuss at next follow-up option for further pain management if he feels it is  not controlled  Patient Goals/Self-Care Activities patient will:  - check glucose three times daily, document, and provide at future appointments check blood  pressure 2-3x/week, document, and provide at future appointments  Follow Up Plan:  Telephone follow up appointment with care management team member scheduled for:09/18/21 Care Manager; 10/24/21 Pharmacist  Next PCP appointment scheduled for: 12/08/21

## 2021-09-12 NOTE — Patient Outreach (Signed)
Medicaid Managed Care Pharmacy Note  09/12/2021 Name:  Jeffrey [name] MRN:  [id number] DOB:  [birth year]  Jeffrey [name] is an 45 y.o. year old male who is a primary patient of [place], Jake Church I, NP.  The [facility] Managed Care Coordination team was consulted for assistance with disease management and care coordination needs.    Engaged with patient by telephone for follow up visit in response to referral for case management and/or care coordination services.  [name] was given information about Medicaid Managed Care Coordination team services today. Jeffrey [name] Patient agreed to services and verbal consent obtained.  Objective:  Lab Results  Component Value Date   CREATININE 1.33 (H) 08/27/2021   CREATININE 1.50 (H) 08/20/2021   CREATININE 1.43 (H) 08/13/2021    Lab Results  Component Value Date   HGBA1C 5.9 (H) 07/20/2021       Component Value Date/Time   CHOL 174 06/10/2021 1341   TRIG 120 06/10/2021 1341   HDL 37 (L) 06/10/2021 1341   CHOLHDL 4.7 06/10/2021 1341   CHOLHDL 4.8 05/12/2021 1303   VLDL 24 05/12/2021 1303   LDLCALC 115 (H) 06/10/2021 1341    BP Readings from Last 3 Encounters:  09/09/21 (!) 112/94  08/27/21 111/69  08/20/21 (!) 76/0     Care Plan  Allergies  Allergen Reactions   Aspirin Shortness Of Breath, Itching and Rash     Burning sensation (Patient reports he tolerates other NSAIDS)    Bee Venom Hives and Swelling   Lisinopril Cough   Shrimp (Diagnostic) Rash   Tomato Rash    Medications Reviewed Today     Reviewed by Hughes Better, RPH-CPP (Pharmacist) on 09/12/21 at 85  Med List Status: <None>   Medication Order Taking? Sig Documenting Provider Last Dose Status Informant  Accu-Chek FastClix Lancets MISC [id number]  USE AS DIRECTED FOUR TIMES DAILY Vevelyn Francois, NP  Active   acetaminophen (TYLENOL) 325 MG tablet [id number]  Take 2 tablets (650 mg total) by mouth every 6 (six) hours as needed for fever. Darrick Grinder D, NP  Active  Self  allopurinol (ZYLOPRIM) 100 MG tablet [id number] Yes Take 1 tablet (100 mg total) by mouth daily. Cathlyn Parsons, PA-C Taking Active Self  amiodarone (PACERONE) 200 MG tablet [id number] Yes Take 1 tablet (200 mg total) by mouth daily. Darrick Grinder D, NP Taking Active   colchicine 0.6 MG tablet [id number] Yes Take 1 tablet (0.6 mg total) by mouth daily. Darrick Grinder D, NP Taking Active   diclofenac Sodium (VOLTAREN) 1 % GEL [id number]  Apply 2 g topically 4 (four) times daily. Izora Ribas, MD  Active Self  docusate sodium (COLACE) 100 MG capsule [id number] No Take 1 capsule (100 mg total) by mouth 2 (two) times daily.  Patient not taking: Reported on 09/09/2021   Darrick Grinder D, NP Not Taking Active Self  enoxaparin (LOVENOX) 60 MG/0.6ML injection [id number] Yes Inject 0.6 mLs (60 mg total) into the skin every 12 (twelve) hours. Bensimhon, Shaune Pascal, MD Taking Active   gabapentin (NEURONTIN) 300 MG capsule [id number] Yes Take 1 capsule (300 mg total) by mouth 3 (three) times daily. Bensimhon, Shaune Pascal, MD Taking Active   glucose blood test strip [id number]  Use as directed 4 times daily. Bo Merino I, NP  Active   insulin aspart protamine - aspart (NOVOLOG 70/30 MIX) (70-30) 100 UNIT/ML FlexPen [id number] Yes Inject 38 Units into the skin 2 (two) times daily. [place], Maricela Bo, FNP Taking  Active Self  Insulin Pen Needle (TRUEPLUS 5-BEVEL PEN NEEDLES) 29G X 12.7MM MISC [id number]  Use as directed in the morning, at noon, and at bedtime. Bo Merino I, NP  Active   Insulin Syringe-Needle U-100 (TRUEPLUS INSULIN SYRINGE) 31G X 5/16" 0.5 ML MISC [id number]  USE TO INJECT INSULIN TWICE DAILY. Bo Merino I, NP  Active   isosorbide-hydrALAZINE (BIDIL) 20-37.5 MG tablet [id number] Yes Take 2 tablets by mouth 3 (three) times daily. Darrick Grinder D, NP Taking Active   meclizine (ANTIVERT) 25 MG tablet [id number] Yes Take 1 tablet (25 mg total) by mouth 3 (three) times daily as needed for dizziness.  Darrick Grinder D, NP Taking Active   metFORMIN (GLUCOPHAGE) 1000 MG tablet [id number] Yes Take 1 tablet (1,000 mg total) by mouth 2 (two) times daily with a meal. [place], Maricela Bo, FNP Taking Active Self  pantoprazole (PROTONIX) 40 MG tablet [id number] No Take 1 tablet (40 mg total) by mouth daily.  Patient not taking: Reported on 09/12/2021   Cathlyn Parsons, PA-C Not Taking Active Self  polyethylene glycol (MIRALAX / GLYCOLAX) 17 g packet [id number] No Take 17 g by mouth daily.  Patient not taking: Reported on 09/09/2021   Cathlyn Parsons, PA-C Not Taking Active Self  sacubitril-valsartan (ENTRESTO) 49-51 MG [id number]  Take 1 tablet by mouth 2 (two) times daily. Clegg, Amy D, NP  Active   sertraline (ZOLOFT) 25 MG tablet [id number] No Take 1 tablet (25 mg total) by mouth daily.  Patient not taking: Reported on 09/12/2021   Bensimhon, Shaune Pascal, MD Not Taking Active Self  sertraline (ZOLOFT) 50 MG tablet [id number] No Take 1 tablet (50 mg total) by mouth daily. Bensimhon, Shaune Pascal, MD Unknown Active   spironolactone (ALDACTONE) 25 MG tablet [id number] Yes Take 1 tablet (25 mg total) by mouth daily. Cathlyn Parsons, PA-C Taking Active Self  traMADol (ULTRAM) 50 MG tablet [id number] No Take 1-2 tablets (50-100 mg total) by mouth every 4 (four) hours as needed for moderate pain.  Patient not taking: Reported on 09/12/2021   Darrick Grinder D, NP Not Taking Active   traZODone (DESYREL) 100 MG tablet [id number] Yes Take 1 tablet (100 mg total) by mouth at bedtime. Cathlyn Parsons, PA-C Taking Active Self  warfarin (COUMADIN) 4 MG tablet [id number] Yes Take 1 tablet (4 mg total) by mouth daily at 4 PM. Bensimhon, Shaune Pascal, MD Taking Active             Patient Active Problem List   Diagnosis Date Noted   Presence of left ventricular assist device (LVAD) ([place]) 07/25/2021   Acute on chronic systolic CHF (congestive heart failure) ([place]) 07/18/2021   Cardiogenic shock (HCC)    Atrial fibrillation with  RVR ([place]) 04/30/2021   Acute hypoxemic respiratory failure (HCC)    Right wrist pain 04/16/2021   Absolute anemia    Rectal bleeding    Iron deficiency 12/25/2020   Long term current use of anticoagulant - apixaban 12/25/2020   Paroxysmal atrial fibrillation (HCC)    Lactic acidosis 11/22/2020   Hypotension 11/22/2020   Aspiration into airway    ICD (implantable cardioverter-defibrillator) battery depletion 05/15/2020   Endotracheal tube present    Respiratory failure ([place])    Acute encephalopathy    Acute pulmonary edema (HCC)    NICM (nonischemic cardiomyopathy) ([place]) 04/24/2020   S/P vasectomy 12/20/2019   Anxiety 12/20/2019   Hemoglobin A1C between 7% and 9% indicating borderline diabetic control ([place]) 12/20/2019   Seasonal  allergies 12/20/2019   Insomnia 12/20/2019   Vasectomy evaluation 06/20/2019   Visual problems 06/20/2019   Blurry vision, bilateral 06/20/2019   Hyperglycemia 02/13/2019   Hyperosmolar (nonketotic) coma ([place]) 01/19/2019   AICD (automatic cardioverter/defibrillator) present 01/19/2019   Hyperlipidemia 12/07/2018   Follow-up exam 11/22/2018   Class 3 severe obesity due to excess calories with serious comorbidity and body mass index (BMI) of 45.0 to 49.9 in adult [facility]) 11/22/2018   Dizziness 11/22/2018   Lingular pneumonia 08/04/2018   Ventricular tachycardia 12/30/2017   PAF (paroxysmal atrial fibrillation) ([place])    Dilated cardiomyopathy ([place])    Pollen allergy 02/17/2017   Dyslipidemia 02/17/2017   Hematochezia 11/08/2015   CKD (chronic kidney disease), stage III ([place]) 06/20/2015   Cardiac defibrillator in situ    Chronic systolic CHF (congestive heart failure) ([place]) 03/11/2015   Gouty arthritis 03/11/2015   Chest pain 12/28/2014   Acute renal failure superimposed on stage 3 chronic kidney disease ([place]) 04/08/2014   Aspirin allergy 04/08/2014   Renal cell carcinoma ([place])    Gout attack 11/10/2011   Morbid obesity ([place]) 10/23/2011   OSA  (obstructive sleep apnea) 10/23/2011   Type 2 diabetes mellitus with stage 3 chronic kidney disease, with long-term current use of insulin ([place]) 10/22/2011   Hyperlipidemia associated with type 2 diabetes mellitus ([place]) 12/25/2010   Hypertension associated with diabetes ([place]) 12/25/2010    Conditions to be addressed/monitored per PCP order:  CHF, HTN, HLD, DMII, CKD Stage 3, Anxiety, and Gout  Care Plan : General Pharmacy (Adult)  Updates made by Hughes Better, RPH-CPP since 09/12/2021 12:00 AM     Problem: Chronic Disease Management      Long-Range Goal: Managing Chronic Disease Therapies   Start Date: 09/12/2021  Expected End Date: 12/11/2021  This Visit's Progress: On track  Priority: High  Note:   Current Barriers:  Suboptimal therapeutic regimen for T2DM  Pharmacist Clinical Goal(s):  patient will maintain control of T2DM, HTN, and HF as evidenced by regular follow-up with providers  through collaboration with PharmD and provider.    Interventions: Inter-disciplinary care team collaboration (see longitudinal plan of care) Comprehensive medication review performed; medication list updated in electronic medical record  Diabetes:  Controlled; current treatment: Novolog 70/30 38 units twice daily, metformin 1000mg  twice daily  Current glucose readings: fasting glucose: 65-100, post prandial glucose: 90-100; Reports no readings above 100  Denies hypoglycemic/hyperglycemic symptoms  Recommended patient continue to check blood glucose three times a day       Collaborated with PCP to recommend GLP1 (Ozempic or Trulicity) and reduction in patient's Novolog dose  Hypertension:  Controlled; current treatment: BIDIL 20-37.5mg  2 tablets three times daily, Entresto 49-51mg  twice daily, spironolactone 25mg  daily  Current home readings: 110/89  Denies hypotensive/hypertensive symptoms  Recommended patient check blood pressure 2-3/week  Heart Failure       Controlled; current  treatment: BIDIL 20-37.5mg  2 tablets three times daily, Entresto 49-51mg  twice daily, spironolactone 25mg  daily  Pain (left thigh incision post surgery)      Mostly controlled; current treatment: Tylenol OTC (previously taking Tramadol which patient reports working better than Tylenol)      Rates pain 8/10 without Tylenol and with Tylenol 5-6      Recommended patient discuss at next follow-up option for further pain management if he feels it is not controlled  Patient Goals/Self-Care Activities patient will:  - check glucose three times daily, document, and provide at future appointments check blood pressure 2-3x/week, document, and  provide at future appointments  Follow Up Plan:  Telephone follow up appointment with care management team member scheduled for:09/18/21 Care Manager; 10/24/21 Pharmacist  Next PCP appointment scheduled for: 12/08/21      Hughes Better PharmD, CPP High Risk [facility]aged [facility] [phone]

## 2021-09-13 ENCOUNTER — Other Ambulatory Visit (HOSPITAL_COMMUNITY): Payer: Self-pay | Admitting: *Deleted

## 2021-09-13 DIAGNOSIS — Z95811 Presence of heart assist device: Secondary | ICD-10-CM

## 2021-09-13 MED ORDER — MOLNUPIRAVIR EUA 200MG CAPSULE: 4.0000 | ORAL_CAPSULE | Freq: Two times a day (BID) | ORAL | 0 refills | Status: AC

## 2021-09-13 NOTE — Progress Notes (Signed)
Received page from patient reporting he has a fever of 102 with intermittent chills. Denies nausea and vomiting, but does endorse some diarrhea. Mild congestion, cough, and fatigue noted. Denies shortness of breath. Occasional dizziness, but this resolves quickly. + COVID home test. Pt's wife tested positive for COVID earlier this week.   Discussed with Dr Aundra Dubin. Order received for Molnupiravir 800 mg BID x 5 days. Discussed medication with [name] PharmD.   Called in presciption to [facility] [address] per pt request. Instructed to pick up prescription and start tonight. Advised to keep hydrated with fever. Discussed appropriate cough/cold medications he may take. Instructed to call VAD coordinator if symptoms worsen. He verbalized understanding of all instructions.   [name] RN [place] Coordinator  Office: [phone]  24/7 Pager: [phone]

## 2021-09-15 ENCOUNTER — Other Ambulatory Visit: Payer: Self-pay

## 2021-09-16 ENCOUNTER — Ambulatory Visit: Payer: Medicaid Other

## 2021-09-16 DIAGNOSIS — G4733 Obstructive sleep apnea (adult) (pediatric): Secondary | ICD-10-CM | POA: Diagnosis not present

## 2021-09-18 ENCOUNTER — Other Ambulatory Visit: Payer: Self-pay | Admitting: Obstetrics and Gynecology

## 2021-09-18 ENCOUNTER — Other Ambulatory Visit: Payer: Self-pay

## 2021-09-18 DIAGNOSIS — I5022 Chronic systolic (congestive) heart failure: Secondary | ICD-10-CM

## 2021-09-18 NOTE — Patient Instructions (Signed)
Hey Mr. Yarbrough, I am glad you are feeling better-have a great afternoon!  Mr. Sciandra was given information about Medicaid Managed Care team care coordination services as a part of their Healthy [facility] Medicaid benefit. [name] verbally consented to engagement with the [facility] Managed Care team.   If you are experiencing a medical emergency, please call 911 or report to your local emergency department or urgent care.   If you have a non-emergency medical problem during routine business hours, please contact your provider's office and ask to speak with a nurse.   For questions related to your Healthy [facility] health plan, please call: [phone] or visit the homepage here: [email or web address]  If you would like to schedule transportation through your Healthy [facility] plan, please call the following number at least 2 days in advance of your appointment: [phone]  Call the [facility] at [phone], at any time, 24 hours a day, 7 days a week. If you are in danger or need immediate medical attention call 911.  If you would like help to quit smoking, call 1-800-QUIT-NOW 312-838-4553) OR Espaol: 1-855-Djelo-Ya (9-476-546-5035) o para ms informacin haga clic aqu or Text READY to 200-400 to register via text  Mr. Chesnut - following are the goals we discussed in your visit today:   Goals Addressed             This Visit's Progress    Protect My Health       Timeframe:  Long-Range Goal Priority:  High Start Date:    09/24/20                         Expected End Date:  ongoing           - schedule recommended health tests  - schedule and keep appointment for annual check-up  09/18/21:  Patient s/p LVAD in October-following up appropriately.     The patient verbalized understanding of instructions provided today and declined a print copy of patient instruction materials.   The Managed Medicaid care management team  will reach out to the patient again over the next 30 days.  The  Patient  has been provided with contact information for the Managed Medicaid care management team and has been advised to call with any health related questions or concerns.   [name] RN, BSN [place]   [facility] Curator - Managed Medicaid High Risk [phone].   Following is a copy of your plan of care:  Care Plan : General Plan of Care (Adult)  Updates made by Gayla Medicus, RN since 09/18/2021 12:00 AM     Problem: Health Promotion or Disease Self-Management (General Plan of Care)   Priority: High  Onset Date: 09/24/2020  Note:   Current Barriers:  Chronic Disease Management support and education needs, CHF, HTN, CKD, OSA, gout, DM2, h/o renal cell carcinoma.  09/18/21 Patient S/P LVAD 07/21/21.  In need of dental resources-still waiting to hear about food stamps and disability.  Nurse Case Manager Clinical Goal(s):  Over the next 90 days, patient will attend all scheduled medical appointments: Over the next 30 days, patient will meet with Pharmacist Over the next 30 days, patient will receive resources  Interventions:  Inter-disciplinary care team collaboration (see longitudinal plan of care) Collaborated with BSW regarding food stamps, disability updates, and childcare resources for voucher-completed. BSW referral for resources related to childcare expense-completed. Evaluation of current treatment plan  and patient's adherence to plan as established by provider.  BSW contacted patient regarding daycare voucher and foodstamps. BSW informed patient he would have to contact DSS and have the child's name placed on the waitlist, patient can also apply for foodstamps at the same time. Patient reports no other resources needed at this time.  09/04/21: BSW spoke with patient, he stated he is not sure if they have been placed on the daycare voucher because his wife has not said  anything to him yet. Patient stated they have applied for foodstamps but have not received them, he thinks its because his wife makes to much money. No other resources are needed at this time. Reviewed medications with patient. Discussed plans with patient for ongoing care management follow up and provided patient with direct contact information for care management team Provided patient with educational materials related to exercise. Reviewed scheduled/upcoming provider appointments. Collaborated with Pharmacy Pharmacy referral for medication review. Care guide referral for dental resources, ? Food stamp update? Collaborated with Care Guide for dental resources.  Patient Goals/Self-Care Activities Over the next 90 days, patient will:  -Attends all scheduled provider appointments Calls provider office for new concerns or questions  Follow Up Plan: The Managed Medicaid care management team will reach out to the patient again over the next 30 days.  The patient has been provided with contact information for the Managed Medicaid care management team and has been advised to call with any health related questions or concerns.

## 2021-09-18 NOTE — Patient Outreach (Signed)
Medicaid Managed Care   Nurse Care Manager Note  09/18/2021 Name:  Jeffrey [name] MRN:  [id number] DOB:  1975/12/21  Jeffrey [name] is an 45 y.o. year old male who is a primary patient of [place], Jake Church I, NP.  The [facility] Managed Care Coordination team was consulted for assistance with:    Chronic healthcare management needs.  Mr. Main was given information about Medicaid Managed Care Coordination team services today. Jeffrey [name] Patient agreed to services and verbal consent obtained.  Engaged with patient by telephone for follow up visit in response to provider referral for case management and/or care coordination services.   Assessments/Interventions:  Review of past medical history, allergies, medications, health status, including review of consultants reports, laboratory and other test data, was performed as part of comprehensive evaluation and provision of chronic care management services.  SDOH (Social Determinants of Health) assessments and interventions performed: SDOH Interventions    Flowsheet Row Most Recent Value  SDOH Interventions   Food Insecurity Interventions Other (Comment)  [applied for food stamps-still waiting to hear]  Housing Interventions Intervention Not Indicated       Care Plan  Allergies  Allergen Reactions   Aspirin Shortness Of Breath, Itching and Rash     Burning sensation (Patient reports he tolerates other NSAIDS)    Bee Venom Hives and Swelling   Lisinopril Cough   Shrimp (Diagnostic) Rash   Tomato Rash    Medications Reviewed Today     Reviewed by Gayla Medicus, RN (Registered Nurse) on 09/18/21 at 1341  Med List Status: <None>   Medication Order Taking? Sig Documenting Provider Last Dose Status Informant  Accu-Chek FastClix Lancets MISC [id number] No USE AS DIRECTED FOUR TIMES DAILY Vevelyn Francois, NP Taking Active   acetaminophen (TYLENOL) 325 MG tablet [id number] No Take 2 tablets (650 mg total) by mouth every 6 (six) hours as needed  for fever. Darrick Grinder D, NP Taking Active Self  allopurinol (ZYLOPRIM) 100 MG tablet [id number] No Take 1 tablet (100 mg total) by mouth daily. Cathlyn Parsons, PA-C Taking Active Self  amiodarone (PACERONE) 200 MG tablet [id number] No Take 1 tablet (200 mg total) by mouth daily. Darrick Grinder D, NP Taking Active   colchicine 0.6 MG tablet [id number] No Take 1 tablet (0.6 mg total) by mouth daily. Darrick Grinder D, NP Taking Active   diclofenac Sodium (VOLTAREN) 1 % GEL [id number] No Apply 2 g topically 4 (four) times daily. Izora Ribas, MD Taking Active Self  docusate sodium (COLACE) 100 MG capsule [id number] No Take 1 capsule (100 mg total) by mouth 2 (two) times daily.  Patient not taking: Reported on 09/09/2021   Darrick Grinder D, NP Not Taking Active Self  enoxaparin (LOVENOX) 60 MG/0.6ML injection [id number] No Inject 0.6 mLs (60 mg total) into the skin every 12 (twelve) hours. Bensimhon, Shaune Pascal, MD Taking Active   gabapentin (NEURONTIN) 300 MG capsule [id number] No Take 1 capsule (300 mg total) by mouth 3 (three) times daily. Bensimhon, Shaune Pascal, MD Taking Active   glucose blood test strip [id number] No Use as directed 4 times daily. Bo Merino I, NP Taking Active   insulin aspart protamine - aspart (NOVOLOG 70/30 MIX) (70-30) 100 UNIT/ML FlexPen [id number] No Inject 38 Units into the skin 2 (two) times daily. Davis, Maricela Bo, FNP Taking Active Self  Insulin Pen Needle (TRUEPLUS 5-BEVEL PEN NEEDLES) 29G X 12.7MM MISC [id number] No Use as directed in the morning, at noon, and at  bedtime. Bo Merino I, NP Taking Active   Insulin Syringe-Needle U-100 (TRUEPLUS INSULIN SYRINGE) 31G X 5/16" 0.5 ML MISC [id number] No USE TO INJECT INSULIN TWICE DAILY. Bo Merino I, NP Taking Active   isosorbide-hydrALAZINE (BIDIL) 20-37.5 MG tablet [id number] No Take 2 tablets by mouth 3 (three) times daily. Darrick Grinder D, NP Taking Active   meclizine (ANTIVERT) 25 MG tablet [id number] No Take 1 tablet (25 mg  total) by mouth 3 (three) times daily as needed for dizziness. Darrick Grinder D, NP Taking Active   metFORMIN (GLUCOPHAGE) 1000 MG tablet [id number] No Take 1 tablet (1,000 mg total) by mouth 2 (two) times daily with a meal. [place], Maricela Bo, FNP Taking Active Self  molnupiravir EUA (LAGEVRIO) 200 mg CAPS capsule [id number]  Take 4 capsules (800 mg total) by mouth 2 (two) times daily for 5 days. Larey Dresser, MD  Active   pantoprazole (PROTONIX) 40 MG tablet [id number] No Take 1 tablet (40 mg total) by mouth daily.  Patient not taking: Reported on 09/12/2021   Cathlyn Parsons, PA-C Not Taking Active Self  polyethylene glycol (MIRALAX / GLYCOLAX) 17 g packet [id number] No Take 17 g by mouth daily.  Patient not taking: Reported on 09/09/2021   Cathlyn Parsons, PA-C Not Taking Active Self  sacubitril-valsartan (ENTRESTO) 49-51 MG [id number] No Take 1 tablet by mouth 2 (two) times daily. Clegg, Amy D, NP Taking Active   sertraline (ZOLOFT) 25 MG tablet [id number] No Take 1 tablet (25 mg total) by mouth daily.  Patient not taking: Reported on 09/12/2021   Bensimhon, Shaune Pascal, MD Not Taking Active Self  sertraline (ZOLOFT) 50 MG tablet [id number] No Take 1 tablet (50 mg total) by mouth daily. Bensimhon, Shaune Pascal, MD Unknown Active   spironolactone (ALDACTONE) 25 MG tablet [id number] No Take 1 tablet (25 mg total) by mouth daily. Cathlyn Parsons, PA-C Taking Active Self  traMADol (ULTRAM) 50 MG tablet [id number] No Take 1-2 tablets (50-100 mg total) by mouth every 4 (four) hours as needed for moderate pain.  Patient not taking: Reported on 09/12/2021   Darrick Grinder D, NP Not Taking Active   traZODone (DESYREL) 100 MG tablet [id number] No Take 1 tablet (100 mg total) by mouth at bedtime. Cathlyn Parsons, PA-C Taking Active Self  warfarin (COUMADIN) 4 MG tablet [id number] No Take 1 tablet (4 mg total) by mouth daily at 4 PM. Bensimhon, Shaune Pascal, MD Taking Active             Patient Active Problem List    Diagnosis Date Noted   Presence of left ventricular assist device (LVAD) ([place]) 07/25/2021   Acute on chronic systolic CHF (congestive heart failure) ([place]) 07/18/2021   Cardiogenic shock (HCC)    Atrial fibrillation with RVR ([place]) 04/30/2021   Acute hypoxemic respiratory failure (HCC)    Right wrist pain 04/16/2021   Absolute anemia    Rectal bleeding    Iron deficiency 12/25/2020   Long term current use of anticoagulant - apixaban 12/25/2020   Paroxysmal atrial fibrillation (HCC)    Lactic acidosis 11/22/2020   Hypotension 11/22/2020   Aspiration into airway    ICD (implantable cardioverter-defibrillator) battery depletion 05/15/2020   Endotracheal tube present    Respiratory failure ([place])    Acute encephalopathy    Acute pulmonary edema (HCC)    NICM (nonischemic cardiomyopathy) ([place]) 04/24/2020   S/P vasectomy 12/20/2019   Anxiety 12/20/2019   Hemoglobin A1C between 7% and 9% indicating borderline diabetic  control ([place]) 12/20/2019   Seasonal allergies 12/20/2019   Insomnia 12/20/2019   Vasectomy evaluation 06/20/2019   Visual problems 06/20/2019   Blurry vision, bilateral 06/20/2019   Hyperglycemia 02/13/2019   Hyperosmolar (nonketotic) coma ([place]) 01/19/2019   AICD (automatic cardioverter/defibrillator) present 01/19/2019   Hyperlipidemia 12/07/2018   Follow-up exam 11/22/2018   Class 3 severe obesity due to excess calories with serious comorbidity and body mass index (BMI) of 45.0 to 49.9 in adult [facility]) 11/22/2018   Dizziness 11/22/2018   Lingular pneumonia 08/04/2018   Ventricular tachycardia 12/30/2017   PAF (paroxysmal atrial fibrillation) (HCC)    Dilated cardiomyopathy ([place])    Pollen allergy 02/17/2017   Dyslipidemia 02/17/2017   Hematochezia 11/08/2015   CKD (chronic kidney disease), stage III ([place]) 06/20/2015   Cardiac defibrillator in situ    Chronic systolic CHF (congestive heart failure) ([place]) 03/11/2015   Gouty arthritis 03/11/2015   Chest pain  12/28/2014   Acute renal failure superimposed on stage 3 chronic kidney disease ([place]) 04/08/2014   Aspirin allergy 04/08/2014   Renal cell carcinoma ([place])    Gout attack 11/10/2011   Morbid obesity ([place]) 10/23/2011   OSA (obstructive sleep apnea) 10/23/2011   Type 2 diabetes mellitus with stage 3 chronic kidney disease, with long-term current use of insulin ([place]) 10/22/2011   Hyperlipidemia associated with type 2 diabetes mellitus ([place]) 12/25/2010   Hypertension associated with diabetes ([place]) 12/25/2010    Conditions to be addressed/monitored per PCP order:   chronic healthcare management needs, DM, HTN, HLD, OSA, CKD, CHF-s/p LVAD  Care Plan : General Plan of Care (Adult)  Updates made by Gayla Medicus, RN since 09/18/2021 12:00 AM     Problem: Health Promotion or Disease Self-Management (General Plan of Care)   Priority: High  Onset Date: 09/24/2020  Note:   Current Barriers:  Chronic Disease Management support and education needs, CHF, HTN, CKD, OSA, gout, DM2, h/o renal cell carcinoma.  09/18/21 Patient S/P LVAD 07/21/21.  In need of dental resources-still waiting to hear about food stamps and disability.  Nurse Case Manager Clinical Goal(s):  Over the next 90 days, patient will attend all scheduled medical appointments: Over the next 30 days, patient will meet with Pharmacist Over the next 30 days, patient will receive resources  Interventions:  Inter-disciplinary care team collaboration (see longitudinal plan of care) Collaborated with BSW regarding food stamps, disability updates, and childcare resources for voucher-completed. BSW referral for resources related to childcare expense-completed. Evaluation of current treatment plan and patient's adherence to plan as established by provider.  BSW contacted patient regarding daycare voucher and foodstamps. BSW informed patient he would have to contact DSS and have the child's name placed on the waitlist, patient can also apply  for foodstamps at the same time. Patient reports no other resources needed at this time.  09/04/21: BSW spoke with patient, he stated he is not sure if they have been placed on the daycare voucher because his wife has not said anything to him yet. Patient stated they have applied for foodstamps but have not received them, he thinks its because his wife makes to much money. No other resources are needed at this time. Reviewed medications with patient. Discussed plans with patient for ongoing care management follow up and provided patient with direct contact information for care management team Provided patient with educational materials related to exercise. Reviewed scheduled/upcoming provider appointments. Collaborated with Pharmacy Pharmacy referral for medication review. Care guide referral for dental resources, ? Food stamp  update? Collaborated with Care Guide for dental resources.  Patient Goals/Self-Care Activities Over the next 90 days, patient will:  -Attends all scheduled provider appointments Calls provider office for new concerns or questions  Follow Up Plan: The Managed Medicaid care management team will reach out to the patient again over the next 30 days.  The patient has been provided with contact information for the Managed Medicaid care management team and has been advised to call with any health related questions or concerns.     Follow Up:  Patient agrees to Care Plan and Follow-up.  Plan: The Managed Medicaid care management team will reach out to the patient again over the next 30 days. and The  Patient has been provided with contact information for the Managed Medicaid care management team and has been advised to call with any health related questions or concerns.  Date/time of next scheduled RN care management/care coordination outreach:  10/17/21 at 1030

## 2021-09-19 ENCOUNTER — Other Ambulatory Visit: Payer: Self-pay

## 2021-09-19 ENCOUNTER — Other Ambulatory Visit (HOSPITAL_COMMUNITY): Payer: Self-pay

## 2021-09-22 ENCOUNTER — Other Ambulatory Visit: Payer: Self-pay

## 2021-09-22 ENCOUNTER — Telehealth (HOSPITAL_COMMUNITY): Payer: Self-pay

## 2021-09-22 ENCOUNTER — Ambulatory Visit (HOSPITAL_COMMUNITY): Payer: Self-pay | Admitting: Pharmacist

## 2021-09-22 ENCOUNTER — Ambulatory Visit (HOSPITAL_COMMUNITY)
Admission: RE | Admit: 2021-09-22 | Discharge: 2021-09-22 | Disposition: A | Discharge status: Home / Self Care | Payer: Medicaid Other | Source: Ambulatory Visit | Attending: Cardiology | Admitting: Cardiology

## 2021-09-22 VITALS — BP 80/0 | HR 69 | Ht 68.0 in | Wt 301.6 lb

## 2021-09-22 DIAGNOSIS — E1122 Type 2 diabetes mellitus with diabetic chronic kidney disease: Secondary | ICD-10-CM | POA: Diagnosis not present

## 2021-09-22 DIAGNOSIS — I428 Other cardiomyopathies: Secondary | ICD-10-CM | POA: Diagnosis not present

## 2021-09-22 DIAGNOSIS — Z95811 Presence of heart assist device: Secondary | ICD-10-CM | POA: Diagnosis not present

## 2021-09-22 DIAGNOSIS — G4733 Obstructive sleep apnea (adult) (pediatric): Secondary | ICD-10-CM | POA: Insufficient documentation

## 2021-09-22 DIAGNOSIS — I10 Essential (primary) hypertension: Secondary | ICD-10-CM | POA: Diagnosis not present

## 2021-09-22 DIAGNOSIS — N1832 Chronic kidney disease, stage 3b: Secondary | ICD-10-CM | POA: Insufficient documentation

## 2021-09-22 DIAGNOSIS — F17200 Nicotine dependence, unspecified, uncomplicated: Secondary | ICD-10-CM | POA: Diagnosis not present

## 2021-09-22 DIAGNOSIS — I480 Paroxysmal atrial fibrillation: Secondary | ICD-10-CM | POA: Insufficient documentation

## 2021-09-22 DIAGNOSIS — M109 Gout, unspecified: Secondary | ICD-10-CM | POA: Insufficient documentation

## 2021-09-22 DIAGNOSIS — F32A Depression, unspecified: Secondary | ICD-10-CM | POA: Insufficient documentation

## 2021-09-22 DIAGNOSIS — F419 Anxiety disorder, unspecified: Secondary | ICD-10-CM | POA: Diagnosis not present

## 2021-09-22 DIAGNOSIS — Z6841 Body Mass Index (BMI) 40.0 and over, adult: Secondary | ICD-10-CM | POA: Diagnosis not present

## 2021-09-22 DIAGNOSIS — Z4801 Encounter for change or removal of surgical wound dressing: Secondary | ICD-10-CM | POA: Diagnosis not present

## 2021-09-22 DIAGNOSIS — Z7984 Long term (current) use of oral hypoglycemic drugs: Secondary | ICD-10-CM | POA: Insufficient documentation

## 2021-09-22 DIAGNOSIS — I130 Hypertensive heart and chronic kidney disease with heart failure and stage 1 through stage 4 chronic kidney disease, or unspecified chronic kidney disease: Secondary | ICD-10-CM | POA: Insufficient documentation

## 2021-09-22 DIAGNOSIS — I5022 Chronic systolic (congestive) heart failure: Secondary | ICD-10-CM | POA: Insufficient documentation

## 2021-09-22 DIAGNOSIS — Z79899 Other long term (current) drug therapy: Secondary | ICD-10-CM | POA: Diagnosis not present

## 2021-09-22 DIAGNOSIS — E1165 Type 2 diabetes mellitus with hyperglycemia: Secondary | ICD-10-CM | POA: Diagnosis not present

## 2021-09-22 DIAGNOSIS — Z7901 Long term (current) use of anticoagulants: Secondary | ICD-10-CM | POA: Diagnosis not present

## 2021-09-22 LAB — BASIC METABOLIC PANEL
Anion gap: 6 (ref 5–15)
BUN: 33 mg/dL — ABNORMAL HIGH (ref 6–20)
CO2: 18 mmol/L — ABNORMAL LOW (ref 22–32)
Calcium: 9.3 mg/dL (ref 8.9–10.3)
Chloride: 113 mmol/L — ABNORMAL HIGH (ref 98–111)
Creatinine, Ser: 1.38 mg/dL — ABNORMAL HIGH (ref 0.61–1.24)
GFR, Estimated: >60 mL/min (ref >60)
Glucose, Bld: 85 mg/dL (ref 70–99)
Potassium: 4.5 mmol/L (ref 3.5–5.1)
Sodium: 137 mmol/L (ref 135–145)

## 2021-09-22 LAB — CBC
HCT: 35.7 % — ABNORMAL LOW (ref 39.0–52.0)
Hemoglobin: 11.2 g/dL — ABNORMAL LOW (ref 13.0–17.0)
MCH: 23.2 pg — ABNORMAL LOW (ref 26.0–34.0)
MCHC: 31.4 g/dL (ref 30.0–36.0)
MCV: 73.9 fL — ABNORMAL LOW (ref 80.0–100.0)
Platelets: 277 10*3/uL (ref 150–400)
RBC: 4.83 MIL/uL (ref 4.22–5.81)
RDW: 19.9 % — ABNORMAL HIGH (ref 11.5–15.5)
WBC: 6.1 10*3/uL (ref 4.0–10.5)
nRBC: 0 % (ref 0.0–0.2)

## 2021-09-22 LAB — PROTIME-INR
INR: 1.9 — ABNORMAL HIGH (ref 0.8–1.2)
Prothrombin Time: 22 seconds — ABNORMAL HIGH (ref 11.4–15.2)

## 2021-09-22 LAB — LACTATE DEHYDROGENASE: LDH: 221 U/L — ABNORMAL HIGH (ref 98–192)

## 2021-09-22 NOTE — Patient Instructions (Signed)
No changes in medications May advance to twice a week dressing changes Return to clinic in 1 month

## 2021-09-22 NOTE — Progress Notes (Signed)
Patient presents for 2 week follow up in [facility] today with his sister. Reports no problems with VAD equipment or concerns with drive line.  Patient walked in clinic without assistance. Reports he has recovered from [place] and is feeling much better. Denies syncopal episodes, falls, or signs of bleeding. Reports mild lightheadedness/dizziness intermittently. Orthostatics checked in clinic today.  60+ PI events noted on 12/16 VAD interrogation. But overall 3-5 daily.  Reports pocket pain is resolved with Gabapentin increase.    Pt reports his anxiety is much better since increasing Zoloft to 50 mg daily.   Pts weight is up 16 lbs today in clinic. Pt tells me that he has a goal weight of 225 lbs but that recently he has not been able to work out due to having [place]. Pt states that he is going to start eating better and start working out again. Pts fluid status is good today.  Vital Signs:  Doppler Pressure: 80  Automatc BP: 99/56 (72) HR: 69 NSR SPO2: 99% on RA   Weight: 301.6 lb w/ eqt Last weight:  284.6 lbs Home weights: 281 - 274 lbs   VAD Indication: Destination Therapy due to smoking and BMI   VAD interrogation & Equipment Management: Speed: 6200 Flow: 4.9 Power: 5.1 w    PI: 2.5   Alarms: no clinical alarms Events: 3-5 daily: 12/16 pt had 60+ PI  Fixed speed 6200 Low speed limit: 5900   Primary Controller:  Replace back up battery in 28 months. Back up controller:   Replace back up battery in 31 months.   Annual Equipment Maintenance on UBC/PM was performed on 07/21/21.    I reviewed the LVAD parameters from today and compared the results to the patient's prior recorded data. LVAD interrogation was NEGATIVE for significant power changes, NEGATIVE for clinical alarms and POSITIVE for PI events/speed drops. No programming changes were made and pump is functioning within specified parameters. Pt is performing daily controller and system monitor self tests along with  completing weekly and monthly maintenance for LVAD equipment.   LVAD equipment check completed and is in good working order. Back-up equipment present.    Exit Site Care: Drive line is being maintained every other day by [name] his wife. Existing VAD dressing removed and site care performed using sterile technique. Drive line exit site cleaned with Chlora prep applicators x 2, allowed to dry, and gauze dressing with Silver strip applied. Exit site healing and partially incorporated, the velour is fully implanted at exit site. Scant amount of serous drainage. No redness, tenderness, foul odor or rash noted. Drive line anchor re-applied. May advance to twice weekly dressing changes. Pt given 7 daily kits with 4 anchors for home use.       Device: BS Therapies: on at 250 BPM Last check: in hosp    BP & Labs:  Doppler 80 - Doppler is reflecting MAP   Hgb 11.2 - No S/S of bleeding. Specifically denies melena/BRBPR or nosebleeds.   LDH stable at 221 with established baseline of 160 - 250. Denies tea-colored urine. No power elevations noted on interrogation.   Plan:  No changes in medications May advance to twice a week dressing changes Return to clinic in 1 month Pt will need anemia panel checked with next INR per Dr [name]   Tanda Rockers RN [place] Coordinator  Office: [phone]  24/7 Pager: [phone]

## 2021-09-22 NOTE — Progress Notes (Signed)
VAD CLINIC Follow-up Visit  PCP: Teena Dunk, NP HF MD: DB   HPI:  Jeffrey [name] is a 45 y.o. male with a history of  poorly controlled HTN, R renal cell carcinoma s/p nephrectomy, CKD IIIa, DM2, OSA, gout, morbid obesity and systolic HF due to NICM. S/p HM-3 VAD placement 07/21/21  Has [place]-[employer] S-ICD   HF dates back to 2012 with EF 25%   Admitted 7/22 with CHF/cardiogenic shock in setting AF with RVR. He was placed on amio gtt and underwent cardioversion. Echo EF < 20%, severe LV dilation, moderate RV dysfunction. Required NE and dobutamine, which were eventually weaned off. Hospitalization c/b by acute respiratory failure with hypoxia requiring mechanical ventilation. GDMT was limited by hypotension and VAD workup initiated. Creatinine peaked at 4.65 improved to 1.58 which is his baseline. He was discharged to CIR, weight 299 lbs.   He was presented at [facility] and discussed with [facility] transplant team and he was approved for DT VAD. Underwent HM3 LVAD by Dr. Cyndia Bent on 07/21/21. Had post-op AF requiring DC-CV.  Follow up for Heart Failure/LVAD:  Here with his daughter. At last visit was having some dizziness and frequent PIs. We suggested liberalizing salt and fluid intake. He has done better with this. Has gained 16 pounds since we last saw him. Doesn't think it is fluid. Says he hasn't worked out for a few weeks due to [employer]. Denies orthopnea or PND. No fevers, chills or problems with driveline. No bleeding, melena or neuro symptoms. No VAD alarms. Taking all meds as prescribed.       VAD Indication: Destination Therapy due to smoking and BMI   VAD interrogation & Equipment Management: Speed: 6200 Flow: 4.9 Power: 5.1 w    PI: 2.5   Alarms: no clinical alarms Events: 3-5 daily: 12/16 pt had 60+ PI  Fixed speed 6200 Low speed limit: 5900   Primary Controller:  Replace back up battery in 28 months. Back up controller:   Replace back up battery in 31 months.    Annual Equipment Maintenance on UBC/PM was performed on 07/21/21.    I reviewed the LVAD parameters from today and compared the results to the patient's prior recorded data. LVAD interrogation was NEGATIVE for significant power changes, NEGATIVE for clinical alarms and POSITIVE for PI events/speed drops. No programming changes were made and pump is functioning within specified parameters. Pt is performing daily controller and system monitor self tests along with completing weekly and monthly maintenance for LVAD equipment.   LVAD equipment check completed and is in good working order. Back-up equipment present.    Past Medical History:  Diagnosis Date   Anxiety    Aspirin allergy    Childhood asthma    Chronic systolic CHF (congestive heart failure) ([place])    a. EF 20-25% in 2012. b. EF 45-50% in 10/2011 with nonischemic nuc - presumed NICM. c. 12/2014 Echo: Sev depressed LV fxn, sev dil LV, mild LVH, mild MR, sev dil LA, mildly reduced RV fxn.   CKD (chronic kidney disease) stage 2, GFR 60-89 ml/min    H/O vasectomy 12/2019   High cholesterol    Hypertension    Morbid obesity ([place])    Nephrolithiasis    OSA on CPAP    Paroxysmal atrial fibrillation ([place])    Presumed NICM    a. 04/2014 Myoview: EF 26%, glob HK, sev glob HK, ? prior infarct;  b. Never cathed 2/2 CKD.   Renal cell  carcinoma ([place])    a. s/p Rt robotic assisted partial converted to radical nephrectomy on 01/2013.   Troponin level elevated    a. 04/2014, 12/2014: felt due to CHF.   Type II diabetes mellitus (HCC)    Ventricular tachycardia    a. appropriate ICD therapy 12/2017    Current Outpatient Medications  Medication Sig Dispense Refill   Accu-Chek FastClix Lancets MISC USE AS DIRECTED FOUR TIMES DAILY 102 each 11   acetaminophen (TYLENOL) 325 MG tablet Take 2 tablets (650 mg total) by mouth every 6 (six) hours as needed for fever.     allopurinol (ZYLOPRIM) 100 MG tablet Take 1 tablet (100 mg total) by mouth daily. 90  tablet 3   amiodarone (PACERONE) 200 MG tablet Take 1 tablet (200 mg total) by mouth daily. 30 tablet 6   colchicine 0.6 MG tablet Take 1 tablet (0.6 mg total) by mouth daily. 30 tablet 6   diclofenac Sodium (VOLTAREN) 1 % GEL Apply 2 g topically 4 (four) times daily. 100 g 0   gabapentin (NEURONTIN) 300 MG capsule Take 1 capsule (300 mg total) by mouth 3 (three) times daily. 90 capsule 6   glucose blood test strip Use as directed 4 times daily. 100 each 11   insulin aspart protamine - aspart (NOVOLOG 70/30 MIX) (70-30) 100 UNIT/ML FlexPen Inject 38 Units into the skin 2 (two) times daily. 15 mL 11   Insulin Pen Needle (TRUEPLUS 5-BEVEL PEN NEEDLES) 29G X 12.7MM MISC Use as directed in the morning, at noon, and at bedtime. 100 each 3   Insulin Syringe-Needle U-100 (TRUEPLUS INSULIN SYRINGE) 31G X 5/16" 0.5 ML MISC USE TO INJECT INSULIN TWICE DAILY. 100 each 11   isosorbide-hydrALAZINE (BIDIL) 20-37.5 MG tablet Take 2 tablets by mouth 3 (three) times daily. 180 tablet 6   metFORMIN (GLUCOPHAGE) 1000 MG tablet Take 1 tablet (1,000 mg total) by mouth 2 (two) times daily with a meal. 60 tablet 11   sacubitril-valsartan (ENTRESTO) 49-51 MG Take 1 tablet by mouth 2 (two) times daily. 60 tablet 6   sertraline (ZOLOFT) 50 MG tablet Take 1 tablet (50 mg total) by mouth daily. 30 tablet 6   spironolactone (ALDACTONE) 25 MG tablet Take 1 tablet (25 mg total) by mouth daily. 30 tablet 6   traZODone (DESYREL) 100 MG tablet Take 1 tablet (100 mg total) by mouth at bedtime. 30 tablet 0   warfarin (COUMADIN) 4 MG tablet Take 1 tablet (4 mg total) by mouth daily at 4 PM. 120 tablet 3   docusate sodium (COLACE) 100 MG capsule Take 1 capsule (100 mg total) by mouth 2 (two) times daily. (Patient not taking: Reported on 09/09/2021) 10 capsule 0   enoxaparin (LOVENOX) 60 MG/0.6ML injection Inject 0.6 mLs (60 mg total) into the skin every 12 (twelve) hours. (Patient not taking: Reported on 09/22/2021) 6 mL 0   meclizine  (ANTIVERT) 25 MG tablet Take 1 tablet (25 mg total) by mouth 3 (three) times daily as needed for dizziness. (Patient not taking: Reported on 09/22/2021) 30 tablet 0   pantoprazole (PROTONIX) 40 MG tablet Take 1 tablet (40 mg total) by mouth daily. (Patient not taking: Reported on 09/12/2021) 30 tablet 0   polyethylene glycol (MIRALAX / GLYCOLAX) 17 g packet Take 17 g by mouth daily. (Patient not taking: Reported on 09/09/2021) 14 each 0   traMADol (ULTRAM) 50 MG tablet Take 1-2 tablets (50-100 mg total) by mouth every 4 (four) hours as needed for moderate pain. (  Patient not taking: Reported on 09/12/2021) 30 tablet 0   No current facility-administered medications for this encounter.    Aspirin, Bee venom, Lisinopril, Shrimp (diagnostic), and Tomato  REVIEW OF SYSTEMS: All systems negative except as listed in HPI, PMH and Problem list.   Vitals:   09/22/21 1201 09/22/21 1202  BP: (!) 99/56 (!) 80/0  Pulse: 69   SpO2: 99%   Weight: (!) 136.8 kg (301 lb 9.6 oz)   Height: 5\' 8"  (1.727 m)     Vital Signs:  Doppler Pressure: 80   Automatc BP: 99/56 (72) HR: 69 NSR SPO2: 99% on RA   Weight: 301.6 lb w/ eqt Last weight:  284.6 lbs Home weights: 281 - 274 lbs    General:  NAD.  HEENT: normal  Neck: supple. JVP not elevated.  Carotids 2+ bilat; no bruits. No lymphadenopathy or thryomegaly appreciated. Cor: LVAD hum.  Lungs: Clear. Abdomen: obese soft, nontender, non-distended. No hepatosplenomegaly. No bruits or masses. Good bowel sounds. Driveline site clean. Anchor in place.  Extremities: no cyanosis, clubbing, rash. Warm no edema  Neuro: alert & oriented x 3. No focal deficits. Moves all 4 without problem     ASSESSMENT AND PLAN:   1. Chronic systolic CHF - Nonischemic CM, end-stage - EF 2012; 20-25%  - Echo  8/22: EF < 20%, severe LV dilation, moderate RV dysfunction.   - RHC (8/22): well compensated hemodynamics. - s/p S-ICD - S/p HM3 LVAD 10/17 (DT) - Continues to  improve. NYHA I-II. Weight up 16 pounds but doesn't appear volume overloaded. Can take torsemide as needed.  - Continue Bidil 2 tabs tid, spironolactone 25 mg daily and entresto 49-51 mg BID.  - Stressed need to renew weight loss efforts. Once BMI ~ 35 will refer to [facility] for begin transplant evaluation (Body mass index is 45.86 kg/m.)   2. VAD  - s/p HM-3 placement 07/21/21  - VAD interrogated personally. Parameters stable. - INR 1.9 Personally reviewed - On neurontin for pocket pain. Can increase as needed - LDH 221 - DL site ok - Hgb 11.7 -> 11.2  (MCV low). Check iron stores at next visit    3 Atrial fibrillation: Paroxysmal - Had severe AF with RVR 7/22 s/p DC-CV x 2 - Seen by EP. Not a candidate for ablation due to body habitus  (could consider AVN ablation and CRT, if needed)  - Had post-op AF with RVR post VAD - 10/23 S/P DC-CV with restoration of NSR. - Remains in NSR. Continue amio 100 daily - On warfarin. INR 1.9   4. HTN - Blood pressure well controlled. Continue current regimen.  5. CKD stage 3B  - Has solitary kidney.   - Baseline creatinine is 1.6-2.9.  - Creatinine 1.38  today.    6. OSA - Continue CPAP   7. DM2. Poorly controlled - On Jardiance. - Hgb A1c 9.4. -> 5.9 - PCP following.  8. Polyarticular gout. - quiescent  9. Depression/anxiety - Improved. Continue Zoloft 50mg  daily  Total time spent 35 minutes. Over half that time spent discussing above.   Glori Bickers, MD  11:24 PM

## 2021-09-22 NOTE — Telephone Encounter (Signed)
Pt is no longer interested in the cardiac rehab program. Closed referral.

## 2021-09-22 NOTE — Progress Notes (Signed)
LVAD INR 

## 2021-09-23 DIAGNOSIS — J9621 Acute and chronic respiratory failure with hypoxia: Secondary | ICD-10-CM | POA: Diagnosis not present

## 2021-09-23 DIAGNOSIS — E1122 Type 2 diabetes mellitus with diabetic chronic kidney disease: Secondary | ICD-10-CM | POA: Diagnosis not present

## 2021-09-23 DIAGNOSIS — J45909 Unspecified asthma, uncomplicated: Secondary | ICD-10-CM | POA: Diagnosis not present

## 2021-09-23 DIAGNOSIS — J9622 Acute and chronic respiratory failure with hypercapnia: Secondary | ICD-10-CM | POA: Diagnosis not present

## 2021-09-23 DIAGNOSIS — E1165 Type 2 diabetes mellitus with hyperglycemia: Secondary | ICD-10-CM | POA: Diagnosis not present

## 2021-09-23 DIAGNOSIS — M109 Gout, unspecified: Secondary | ICD-10-CM | POA: Diagnosis not present

## 2021-09-23 DIAGNOSIS — F419 Anxiety disorder, unspecified: Secondary | ICD-10-CM | POA: Diagnosis not present

## 2021-09-23 DIAGNOSIS — I480 Paroxysmal atrial fibrillation: Secondary | ICD-10-CM | POA: Diagnosis not present

## 2021-09-23 DIAGNOSIS — I5023 Acute on chronic systolic (congestive) heart failure: Secondary | ICD-10-CM | POA: Diagnosis not present

## 2021-09-23 DIAGNOSIS — I130 Hypertensive heart and chronic kidney disease with heart failure and stage 1 through stage 4 chronic kidney disease, or unspecified chronic kidney disease: Secondary | ICD-10-CM | POA: Diagnosis not present

## 2021-09-23 DIAGNOSIS — I428 Other cardiomyopathies: Secondary | ICD-10-CM | POA: Diagnosis not present

## 2021-09-23 DIAGNOSIS — N1831 Chronic kidney disease, stage 3a: Secondary | ICD-10-CM | POA: Diagnosis not present

## 2021-09-23 DIAGNOSIS — E7800 Pure hypercholesterolemia, unspecified: Secondary | ICD-10-CM | POA: Diagnosis not present

## 2021-09-26 ENCOUNTER — Other Ambulatory Visit (HOSPITAL_COMMUNITY): Payer: Self-pay

## 2021-09-30 ENCOUNTER — Other Ambulatory Visit: Payer: Self-pay

## 2021-09-30 ENCOUNTER — Other Ambulatory Visit: Payer: Self-pay | Admitting: Nurse Practitioner

## 2021-10-01 ENCOUNTER — Other Ambulatory Visit: Payer: Self-pay

## 2021-10-01 ENCOUNTER — Other Ambulatory Visit: Payer: Self-pay | Admitting: Nurse Practitioner

## 2021-10-01 MED ORDER — TRAZODONE HCL 100 MG PO TABS
100.0000 mg | ORAL_TABLET | Freq: Every day | ORAL | 0 refills | Status: DC
  Filled 2021-10-01: qty 30, 30d supply, fill #0

## 2021-10-02 ENCOUNTER — Other Ambulatory Visit (HOSPITAL_COMMUNITY): Payer: Self-pay | Admitting: *Deleted

## 2021-10-02 DIAGNOSIS — I5022 Chronic systolic (congestive) heart failure: Secondary | ICD-10-CM

## 2021-10-02 DIAGNOSIS — E611 Iron deficiency: Secondary | ICD-10-CM

## 2021-10-02 DIAGNOSIS — Z7901 Long term (current) use of anticoagulants: Secondary | ICD-10-CM

## 2021-10-02 DIAGNOSIS — Z95811 Presence of heart assist device: Secondary | ICD-10-CM

## 2021-10-06 ENCOUNTER — Other Ambulatory Visit: Payer: Self-pay

## 2021-10-07 ENCOUNTER — Other Ambulatory Visit: Payer: Self-pay

## 2021-10-07 ENCOUNTER — Ambulatory Visit (HOSPITAL_COMMUNITY)
Admission: RE | Admit: 2021-10-07 | Discharge: 2021-10-07 | Disposition: A | Discharge status: Home / Self Care | Payer: Medicaid Other | Source: Ambulatory Visit | Attending: Internal Medicine | Admitting: Internal Medicine

## 2021-10-07 ENCOUNTER — Ambulatory Visit (HOSPITAL_COMMUNITY): Payer: Self-pay | Admitting: Pharmacist

## 2021-10-07 DIAGNOSIS — Z7901 Long term (current) use of anticoagulants: Secondary | ICD-10-CM | POA: Insufficient documentation

## 2021-10-07 DIAGNOSIS — Z95811 Presence of heart assist device: Secondary | ICD-10-CM | POA: Diagnosis not present

## 2021-10-07 DIAGNOSIS — Z5181 Encounter for therapeutic drug level monitoring: Secondary | ICD-10-CM | POA: Diagnosis not present

## 2021-10-07 DIAGNOSIS — E611 Iron deficiency: Secondary | ICD-10-CM | POA: Insufficient documentation

## 2021-10-07 DIAGNOSIS — I5022 Chronic systolic (congestive) heart failure: Secondary | ICD-10-CM | POA: Insufficient documentation

## 2021-10-07 LAB — IRON AND TIBC
Iron: 41 ug/dL — ABNORMAL LOW (ref 45–182)
Saturation Ratios: 10 % — ABNORMAL LOW (ref 17.9–39.5)
TIBC: 421 ug/dL (ref 250–450)
UIBC: 380 ug/dL

## 2021-10-07 LAB — FERRITIN: Ferritin: 18 ng/mL — ABNORMAL LOW (ref 24–336)

## 2021-10-07 LAB — PROTIME-INR
INR: 1.4 — ABNORMAL HIGH (ref 0.8–1.2)
Prothrombin Time: 17.4 seconds — ABNORMAL HIGH (ref 11.4–15.2)

## 2021-10-07 LAB — FOLATE: Folate: 7.6 ng/mL (ref >5.9)

## 2021-10-07 LAB — VITAMIN B12: Vitamin B-12: 177 pg/mL — ABNORMAL LOW (ref 180–914)

## 2021-10-07 NOTE — Progress Notes (Signed)
LVAD INR 

## 2021-10-08 ENCOUNTER — Telehealth (HOSPITAL_COMMUNITY): Payer: Self-pay | Admitting: Licensed Clinical Social Worker

## 2021-10-08 ENCOUNTER — Other Ambulatory Visit (HOSPITAL_COMMUNITY): Payer: Self-pay | Admitting: *Deleted

## 2021-10-08 DIAGNOSIS — D509 Iron deficiency anemia, unspecified: Secondary | ICD-10-CM

## 2021-10-08 DIAGNOSIS — Z95811 Presence of heart assist device: Secondary | ICD-10-CM

## 2021-10-08 DIAGNOSIS — E611 Iron deficiency: Secondary | ICD-10-CM

## 2021-10-08 NOTE — Progress Notes (Signed)
Per anemia panel results pt scheduled for 3 doses of IV Fereheme per Dr [name]. First infusion scheduled 10/13/21 at 1pm.   Spoke with patient about the above appt. Provided with medical day phone number [phone] if he needs to reschedule. He verbalized understanding of the above information. He requested we set up transportation through [employer]. Will discuss transportation request with Kenny[place] Lose and/or [place].   [name] RN [place] Coordinator  Office: [phone]  24/7 Pager: [phone]

## 2021-10-08 NOTE — Telephone Encounter (Signed)
CSW consulted to help pt get ride to appt next week- ride scheduled through [employer].    Jorge Ny, LCSW Clinical Social Worker [facility] Desk#: [phone] Cell#: [phone]

## 2021-10-13 ENCOUNTER — Encounter (HOSPITAL_COMMUNITY)
Admission: RE | Admit: 2021-10-13 | Discharge: 2021-10-13 | Disposition: A | Discharge status: Home / Self Care | Payer: Medicaid Other | Source: Ambulatory Visit | Attending: Internal Medicine | Admitting: Internal Medicine

## 2021-10-13 DIAGNOSIS — D509 Iron deficiency anemia, unspecified: Secondary | ICD-10-CM | POA: Insufficient documentation

## 2021-10-13 DIAGNOSIS — Z95811 Presence of heart assist device: Secondary | ICD-10-CM | POA: Insufficient documentation

## 2021-10-13 DIAGNOSIS — E611 Iron deficiency: Secondary | ICD-10-CM | POA: Diagnosis not present

## 2021-10-13 MED ORDER — SODIUM CHLORIDE 0.9 % IV SOLN
510.0000 mg | INTRAVENOUS | Status: DC
  Administered 2021-10-13: 510 mg via INTRAVENOUS
  Filled 2021-10-13: qty 510

## 2021-10-14 ENCOUNTER — Encounter (HOSPITAL_COMMUNITY): Payer: Medicaid Other

## 2021-10-14 ENCOUNTER — Other Ambulatory Visit: Payer: Self-pay

## 2021-10-15 ENCOUNTER — Other Ambulatory Visit: Payer: Self-pay

## 2021-10-16 ENCOUNTER — Other Ambulatory Visit: Payer: Self-pay

## 2021-10-17 ENCOUNTER — Other Ambulatory Visit: Payer: Self-pay

## 2021-10-17 ENCOUNTER — Other Ambulatory Visit: Payer: Self-pay | Admitting: Obstetrics and Gynecology

## 2021-10-17 NOTE — Patient Outreach (Signed)
Medicaid Managed Care   Nurse Care Manager Note  10/17/2021 Name:  Jeffrey [name] MRN:  [id number] DOB:  1976/07/26  Jeffrey [name] is an 46 y.o. year old male who is a primary patient of Pass[place], Jake Church I, NP.  The [facility] Managed Care Coordination team was consulted for assistance with:    Chronic healthcare management needs.  Mr. Vallee was given information about Medicaid Managed Care Coordination team services today. Jeffrey [name] Patient agreed to services and verbal consent obtained.  Engaged with patient by telephone for follow up visit in response to provider referral for case management and/or care coordination services.   Assessments/Interventions:  Review of past medical history, allergies, medications, health status, including review of consultants reports, laboratory and other test data, was performed as part of comprehensive evaluation and provision of chronic care management services.  SDOH (Social Determinants of Health) assessments and interventions performed: SDOH Interventions    Flowsheet Row Most Recent Value  SDOH Interventions   Intimate Partner Violence Interventions Intervention Not Indicated  Physical Activity Interventions Intervention Not Indicated       Care Plan  Allergies  Allergen Reactions   Aspirin Shortness Of Breath, Itching and Rash     Burning sensation (Patient reports he tolerates other NSAIDS)    Bee Venom Hives and Swelling   Lisinopril Cough   Shrimp (Diagnostic) Rash   Tomato Rash    Medications Reviewed Today     Reviewed by Gayla Medicus, RN (Registered Nurse) on 10/17/21 at 1101  Med List Status: <None>   Medication Order Taking? Sig Documenting Provider Last Dose Status Informant  Accu-Chek FastClix Lancets MISC [id number] No USE AS DIRECTED FOUR TIMES DAILY Vevelyn Francois, NP Taking Active   acetaminophen (TYLENOL) 325 MG tablet [id number] No Take 2 tablets (650 mg total) by mouth every 6 (six) hours as needed for fever. Darrick Grinder D, NP Taking Active Self  allopurinol (ZYLOPRIM) 100 MG tablet [id number] No Take 1 tablet (100 mg total) by mouth daily. Cathlyn Parsons, PA-C Taking Active Self  amiodarone (PACERONE) 200 MG tablet [id number] No Take 1 tablet (200 mg total) by mouth daily. Darrick Grinder D, NP Taking Active   colchicine 0.6 MG tablet [id number] No Take 1 tablet (0.6 mg total) by mouth daily. Darrick Grinder D, NP Taking Active   diclofenac Sodium (VOLTAREN) 1 % GEL [id number] No Apply 2 g topically 4 (four) times daily. Izora Ribas, MD Taking Active Self  docusate sodium (COLACE) 100 MG capsule [id number] No Take 1 capsule (100 mg total) by mouth 2 (two) times daily.  Patient not taking: Reported on 09/09/2021   Darrick Grinder D, NP Not Taking Active Self  enoxaparin (LOVENOX) 60 MG/0.6ML injection [id number] No Inject 0.6 mLs (60 mg total) into the skin every 12 (twelve) hours.  Patient not taking: Reported on 09/22/2021   Bensimhon, Shaune Pascal, MD Not Taking Active   gabapentin (NEURONTIN) 300 MG capsule [id number] No Take 1 capsule (300 mg total) by mouth 3 (three) times daily. Bensimhon, Shaune Pascal, MD Taking Active   glucose blood test strip [id number] No Use as directed 4 times daily. Bo Merino I, NP Taking Active   insulin aspart protamine - aspart (NOVOLOG 70/30 MIX) (70-30) 100 UNIT/ML FlexPen [id number] No Inject 38 Units into the skin 2 (two) times daily. [place], Maricela Bo, FNP Taking Active Self  Insulin Pen Needle (TRUEPLUS 5-BEVEL PEN NEEDLES) 29G X 12.7MM MISC [id number] No Use as directed in the  morning, at noon, and at bedtime. Bo Merino I, NP Taking Active   Insulin Syringe-Needle U-100 (TRUEPLUS INSULIN SYRINGE) 31G X 5/16" 0.5 ML MISC [id number] No USE TO INJECT INSULIN TWICE DAILY. Bo Merino I, NP Taking Active   isosorbide-hydrALAZINE (BIDIL) 20-37.5 MG tablet [id number] No Take 2 tablets by mouth 3 (three) times daily. Darrick Grinder D, NP Taking Active   meclizine (ANTIVERT) 25 MG tablet  [id number] No Take 1 tablet (25 mg total) by mouth 3 (three) times daily as needed for dizziness.  Patient not taking: Reported on 09/22/2021   Darrick Grinder D, NP Not Taking Active   metFORMIN (GLUCOPHAGE) 1000 MG tablet [id number] No Take 1 tablet (1,000 mg total) by mouth 2 (two) times daily with a meal. [place], Maricela Bo, FNP Taking Active Self  pantoprazole (PROTONIX) 40 MG tablet [id number] No Take 1 tablet (40 mg total) by mouth daily.  Patient not taking: Reported on 09/12/2021   Cathlyn Parsons, PA-C Not Taking Active Self  polyethylene glycol (MIRALAX / GLYCOLAX) 17 g packet [id number] No Take 17 g by mouth daily.  Patient not taking: Reported on 09/09/2021   Cathlyn Parsons, PA-C Not Taking Active Self  sacubitril-valsartan (ENTRESTO) 49-51 MG [id number] No Take 1 tablet by mouth 2 (two) times daily. [place], Amy D, NP Taking Active   sertraline (ZOLOFT) 50 MG tablet [id number] No Take 1 tablet (50 mg total) by mouth daily. Bensimhon, Shaune Pascal, MD Taking Active   spironolactone (ALDACTONE) 25 MG tablet [id number] No Take 1 tablet (25 mg total) by mouth daily. Cathlyn Parsons, PA-C Taking Active Self  traMADol (ULTRAM) 50 MG tablet [id number] No Take 1-2 tablets (50-100 mg total) by mouth every 4 (four) hours as needed for moderate pain.  Patient not taking: Reported on 09/12/2021   Darrick Grinder D, NP Not Taking Active   traZODone (DESYREL) 100 MG tablet [id number]  Take 1 tablet (100 mg total) by mouth at bedtime. Bo Merino I, NP  Active   warfarin (COUMADIN) 4 MG tablet [id number] No Take 1 tablet (4 mg total) by mouth daily at 4 PM. Bensimhon, Shaune Pascal, MD Taking Active             Patient Active Problem List   Diagnosis Date Noted   Presence of left ventricular assist device (LVAD) ([place]) 07/25/2021   Acute on chronic systolic CHF (congestive heart failure) ([place]) 07/18/2021   Cardiogenic shock (HCC)    Atrial fibrillation with RVR ([place]) 04/30/2021   Acute hypoxemic  respiratory failure (HCC)    Right wrist pain 04/16/2021   Absolute anemia    Rectal bleeding    Iron deficiency 12/25/2020   Long term current use of anticoagulant - apixaban 12/25/2020   Paroxysmal atrial fibrillation (HCC)    Lactic acidosis 11/22/2020   Hypotension 11/22/2020   Aspiration into airway    ICD (implantable cardioverter-defibrillator) battery depletion 05/15/2020   Endotracheal tube present    Respiratory failure ([place])    Acute encephalopathy    Acute pulmonary edema (HCC)    NICM (nonischemic cardiomyopathy) ([place]) 04/24/2020   S/P vasectomy 12/20/2019   Anxiety 12/20/2019   Hemoglobin A1C between 7% and 9% indicating borderline diabetic control ([place]) 12/20/2019   Seasonal allergies 12/20/2019   Insomnia 12/20/2019   Vasectomy evaluation 06/20/2019   Visual problems 06/20/2019   Blurry vision, bilateral 06/20/2019   Hyperglycemia 02/13/2019   Hyperosmolar (nonketotic) coma ([place]) 01/19/2019   AICD (automatic cardioverter/defibrillator) present 01/19/2019   Hyperlipidemia 12/07/2018  Follow-up exam 11/22/2018   Class 3 severe obesity due to excess calories with serious comorbidity and body mass index (BMI) of 45.0 to 49.9 in adult [facility]) 11/22/2018   Dizziness 11/22/2018   Lingular pneumonia 08/04/2018   Ventricular tachycardia 12/30/2017   PAF (paroxysmal atrial fibrillation) (HCC)    Dilated cardiomyopathy ([place])    Pollen allergy 02/17/2017   Dyslipidemia 02/17/2017   Hematochezia 11/08/2015   CKD (chronic kidney disease), stage III ([place]) 06/20/2015   Cardiac defibrillator in situ    Chronic systolic CHF (congestive heart failure) ([place]) 03/11/2015   Gouty arthritis 03/11/2015   Chest pain 12/28/2014   Acute renal failure superimposed on stage 3 chronic kidney disease ([place]) 04/08/2014   Aspirin allergy 04/08/2014   Renal cell carcinoma ([place])    Gout attack 11/10/2011   Morbid obesity ([place]) 10/23/2011   OSA (obstructive sleep apnea) 10/23/2011   Type 2  diabetes mellitus with stage 3 chronic kidney disease, with long-term current use of insulin ([place]) 10/22/2011   Hyperlipidemia associated with type 2 diabetes mellitus ([place]) 12/25/2010   Hypertension associated with diabetes ([place]) 12/25/2010    Conditions to be addressed/monitored per PCP order:   chronic healthcare management needs, DM, HTN, HLD, OSA, CKD, CHF S/P LVAD, gout  Care Plan : General Plan of Care (Adult)  Updates made by Gayla Medicus, RN since 10/17/2021 12:00 AM     Problem: Health Promotion or Disease Self-Management (General Plan of Care)   Priority: High  Onset Date: 09/24/2020  Note:   Current Barriers:  Chronic Disease Management support and education needs, CHF, HTN, CKD, OSA, gout, DM2, h/o renal cell carcinoma.  10/17/21:  Patient stable S/P LVAD in October, BP and CBG WNL.  Patient awaiting food stamp and disability information-in need of dental resources.  Nurse Case Manager Clinical Goal(s):  Over the next 90 days, patient will attend all scheduled medical appointments: Over the next 30 days, patient will meet with Pharmacist Over the next 30 days, patient will receive resources  Interventions:  Inter-disciplinary care team collaboration (see longitudinal plan of care) Collaborated with BSW regarding food stamps and dental resources. BSW referral for food stamp updates and dental resources. Evaluation of current treatment plan and patient's adherence to plan as established by provider.  BSW contacted patient regarding daycare voucher and foodstamps. BSW informed patient he would have to contact DSS and have the child's name placed on the waitlist, patient can also apply for foodstamps at the same time. Patient reports no other resources needed at this time.  09/04/21: BSW spoke with patient, he stated he is not sure if they have been placed on the daycare voucher because his wife has not said anything to him yet. Patient stated they have applied for foodstamps  but have not received them, he thinks its because his wife makes to much money. No other resources are needed at this time. Reviewed medications with patient. Discussed plans with patient for ongoing care management follow up and provided patient with direct contact information for care management team Provided patient with educational materials related to exercise. Reviewed scheduled/upcoming provider appointments. Collaborated with Pharmacy Pharmacy referral for medication review  Patient Goals/Self-Care Activities Over the next 90 days, patient will:  -Attends all scheduled provider appointments Calls provider office for new concerns or questions  Follow Up Plan: The Managed Medicaid care management team will reach out to the patient again over the next 30 days.  The patient has been provided with contact information for  the Managed Medicaid care management team and has been advised to call with any health related questions or concerns.    Follow Up:  Patient agrees to Care Plan and Follow-up.  Plan: The Managed Medicaid care management team will reach out to the patient again over the next 30 days. and The  Patient has been provided with contact information for the Managed Medicaid care management team and has been advised to call with any health related questions or concerns.  Date/time of next scheduled RN care management/care coordination outreach:  11/17/21 at 0900

## 2021-10-17 NOTE — Patient Instructions (Signed)
Hi Jeffrey [name], nice to talk with you today-have a great Friday and weekend!  Jeffrey [name] was given information about Medicaid Managed Care team care coordination services as a part of their Healthy [facility] Medicaid benefit. Jeffrey [name] verbally consented to engagement with the [facility] Managed Care team.   If you are experiencing a medical emergency, please call 911 or report to your local emergency department or urgent care.   If you have a non-emergency medical problem during routine business hours, please contact your provider's office and ask to speak with a nurse.   For questions related to your Healthy [facility] health plan, please call: [phone] or visit the homepage here: [email or web address]  If you would like to schedule transportation through your Healthy [facility] plan, please call the following number at least 2 days in advance of your appointment: [phone]  Call the [place] at [phone], at any time, 24 hours a day, 7 days a week. If you are in danger or need immediate medical attention call 911.  If you would like help to quit smoking, call 1-800-QUIT-NOW 605-535-5695) OR Espaol: 1-855-Djelo-Ya (2-542-706-2376) o para ms informacin haga clic aqu or Text READY to 200-400 to register via text  Jeffrey [name] - following are the goals we discussed in your visit today:   Goals Addressed             This Visit's Progress    Protect My Health       Timeframe:  Long-Range Goal Priority:  High Start Date:    09/24/20                         Expected End Date:  ongoing             - schedule recommended health tests  - schedule and keep appointment for annual check-up  09/18/21:  Patient s/p LVAD in October-following up appropriately. 10/17/21:  Patient continues with regular follow up appointments     The patient verbalized understanding of instructions provided today and declined a print copy of  patient instruction materials.   The Managed Medicaid care management team will reach out to the patient again over the next 30 days.  The  Patient  has been provided with contact information for the Managed Medicaid care management team and has been advised to call with any health related questions or concerns.   [name] RN, BSN [place] Management Coordinator - Managed Medicaid High Risk [phone]   Following is a copy of your plan of care:  Care Plan : General Plan of Care (Adult)  Updates made by Gayla Medicus, RN since 10/17/2021 12:00 AM     Problem: Health Promotion or Disease Self-Management (General Plan of Care)   Priority: High  Onset Date: 09/24/2020  Note:   Current Barriers:  Chronic Disease Management support and education needs, CHF, HTN, CKD, OSA, gout, DM2, h/o renal cell carcinoma.  10/17/21:  Patient stable S/P LVAD in October, BP and CBG WNL.  Patient awaiting food stamp and disability information-in need of dental resources.  Nurse Case Manager Clinical Goal(s):  Over the next 90 days, patient will attend all scheduled medical appointments: Over the next 30 days, patient will meet with Pharmacist Over the next 30 days, patient will receive resources  Interventions:  Inter-disciplinary care team collaboration (see longitudinal plan of care) Collaborated with BSW regarding food stamps and dental resources.  BSW referral for food stamp updates and dental resources. Evaluation of current treatment plan and patient's adherence to plan as established by provider.  BSW contacted patient regarding daycare voucher and foodstamps. BSW informed patient he would have to contact DSS and have the child's name placed on the waitlist, patient can also apply for foodstamps at the same time. Patient reports no other resources needed at this time.  09/04/21: BSW spoke with patient, he stated he is not sure if they have been placed on the  daycare voucher because his wife has not said anything to him yet. Patient stated they have applied for foodstamps but have not received them, he thinks its because his wife makes to much money. No other resources are needed at this time. Reviewed medications with patient. Discussed plans with patient for ongoing care management follow up and provided patient with direct contact information for care management team Provided patient with educational materials related to exercise. Reviewed scheduled/upcoming provider appointments. Collaborated with Pharmacy Pharmacy referral for medication review  Patient Goals/Self-Care Activities Over the next 90 days, patient will:  -Attends all scheduled provider appointments Calls provider office for new concerns or questions  Follow Up Plan: The Managed Medicaid care management team will reach out to the patient again over the next 30 days.  The patient has been provided with contact information for the Managed Medicaid care management team and has been advised to call with any health related questions or concerns.

## 2021-10-20 ENCOUNTER — Other Ambulatory Visit: Payer: Self-pay

## 2021-10-20 ENCOUNTER — Encounter (HOSPITAL_COMMUNITY)
Admission: RE | Admit: 2021-10-20 | Discharge: 2021-10-20 | Disposition: A | Discharge status: Home / Self Care | Payer: Medicaid Other | Source: Ambulatory Visit | Attending: Internal Medicine | Admitting: Internal Medicine

## 2021-10-20 DIAGNOSIS — E611 Iron deficiency: Secondary | ICD-10-CM | POA: Diagnosis not present

## 2021-10-20 DIAGNOSIS — Z95811 Presence of heart assist device: Secondary | ICD-10-CM

## 2021-10-20 DIAGNOSIS — D509 Iron deficiency anemia, unspecified: Secondary | ICD-10-CM

## 2021-10-20 MED ORDER — SODIUM CHLORIDE 0.9 % IV SOLN
510.0000 mg | INTRAVENOUS | Status: DC
  Administered 2021-10-20: 510 mg via INTRAVENOUS
  Filled 2021-10-20: qty 510

## 2021-10-21 ENCOUNTER — Other Ambulatory Visit: Payer: Medicaid Other | Admitting: Obstetrics and Gynecology

## 2021-10-21 ENCOUNTER — Other Ambulatory Visit (HOSPITAL_COMMUNITY): Payer: Self-pay | Admitting: *Deleted

## 2021-10-21 DIAGNOSIS — Z95811 Presence of heart assist device: Secondary | ICD-10-CM

## 2021-10-21 DIAGNOSIS — Z7901 Long term (current) use of anticoagulants: Secondary | ICD-10-CM

## 2021-10-21 NOTE — Patient Outreach (Signed)
Care Coordination  10/21/2021  Jeffrey [name] 05/23/1976 [id number]  RNCM returned patient's phone call to answer question.  All questions answered at this time.  [name] RN, BSN [place]   [place] Curator - Managed Medicaid High Risk [phone].

## 2021-10-22 ENCOUNTER — Other Ambulatory Visit: Payer: Self-pay

## 2021-10-22 ENCOUNTER — Encounter (HOSPITAL_COMMUNITY): Payer: Self-pay | Admitting: Unknown Physician Specialty

## 2021-10-22 NOTE — Patient Instructions (Signed)
Visit Information  Mr. Carll was given information about Medicaid Managed Care team care coordination services as a part of their Healthy [facility] Medicaid benefit. [name] verbally consented to engagement with the [facility] Managed Care team.   If you are experiencing a medical emergency, please call 911 or report to your local emergency department or urgent care.   If you have a non-emergency medical problem during routine business hours, please contact your provider's office and ask to speak with a nurse.   For questions related to your Healthy [facility] health plan, please call: [phone] or visit the homepage here: [email or web address]  If you would like to schedule transportation through your Healthy [facility] plan, please call the following number at least 2 days in advance of your appointment: [phone]  Call the [place] at [phone], at any time, 24 hours a day, 7 days a week. If you are in danger or need immediate medical attention call 911.  If you would like help to quit smoking, call 1-800-QUIT-NOW 830 542 0512) OR Espaol: 1-855-Djelo-Ya (0-814-481-8563) o para ms informacin haga clic aqu or Text READY to 200-400 to register via text  Mr. Dill - following are the goals we discussed in your visit today:   Goals Addressed   None      Social Worker will follow up in 30 days.   [name], BSW, [place]  High Risk Managed Medicaid Team  [phone]   Following is a copy of your plan of care:  Care Plan : General Plan of Care (Adult)  Updates made by Ethelda Chick since 10/22/2021 12:00 AM     Problem: Health Promotion or Disease Self-Management (General Plan of Care)   Priority: High  Onset Date: 09/24/2020  Note:   Current Barriers:  Chronic Disease Management support and education needs, CHF, HTN, CKD, OSA, gout, DM2, h/o renal cell  carcinoma.  10/17/21:  Patient stable S/P LVAD in October, BP and CBG WNL.  Patient awaiting food stamp and disability information-in need of dental resources.  Nurse Case Manager Clinical Goal(s):  Over the next 90 days, patient will attend all scheduled medical appointments: Over the next 30 days, patient will meet with Pharmacist Over the next 30 days, patient will receive resources  Interventions:  Inter-disciplinary care team collaboration (see longitudinal plan of care) Collaborated with BSW regarding food stamps and dental resources. BSW referral for food stamp updates and dental resources. Evaluation of current treatment plan and patient's adherence to plan as established by provider.  BSW contacted patient regarding daycare voucher and foodstamps. BSW informed patient he would have to contact DSS and have the child's name placed on the waitlist, patient can also apply for foodstamps at the same time. Patient reports no other resources needed at this time.  09/04/21: BSW spoke with patient, he stated he is not sure if they have been placed on the daycare voucher because his wife has not said anything to him yet. Patient stated they have applied for foodstamps but have not received them, he thinks its because his wife makes to much money. No other resources are needed at this time. 10/22/21: Bsw completed a follow up telephone call with patient regarding dental resources and follow up with his foodstamps. Patient stated he would like for dental resources to be mailed to him. Patient asked BSW if she could provide an update on his foodstamp application, BSW informed patient he would have to contact  DSS call center or his worker to get an update on his foodstamp application. No other resources are needed at this time. Reviewed medications with patient. Discussed plans with patient for ongoing care management follow up and provided patient with direct contact information for care management  team Provided patient with educational materials related to exercise. Reviewed scheduled/upcoming provider appointments. Collaborated with Pharmacy Pharmacy referral for medication review  Patient Goals/Self-Care Activities Over the next 90 days, patient will:  -Attends all scheduled provider appointments Calls provider office for new concerns or questions  Follow Up Plan: The Managed Medicaid care management team will reach out to the patient again over the next 30 days.  The patient has been provided with contact information for the Managed Medicaid care management team and has been advised to call with any health related questions or concerns.

## 2021-10-22 NOTE — Patient Outreach (Signed)
Medicaid Managed Care Social Work Note  10/22/2021 Name:  Jeffrey [name] MRN:  [id number] DOB:  [birth year]  Jeffrey [name] is an 46 y.o. year old male who is a primary patient of [place], Jake Church I, NP.  The Medicaid Managed Care Coordination team was consulted for assistance with:  Community Resources   Mr. Furukawa was given information about Medicaid Managed Care Coordination team services today. Jeffrey [name] Patient agreed to services and verbal consent obtained.  Engaged with patient  for by telephone forfollow up visit in response to referral for case management and/or care coordination services.   Assessments/Interventions:  Review of past medical history, allergies, medications, health status, including review of consultants reports, laboratory and other test data, was performed as part of comprehensive evaluation and provision of chronic care management services.  SDOH: (Social Determinant of Health) assessments and interventions performed: Bsw completed a follow up telephone call with patient regarding dental resources and follow up with his foodstamps. Patient stated he would like for dental resources to be mailed to him. Patient asked BSW if she could provide an update on his foodstamp application, BSW informed patient he would have to contact DSS call center or his worker to get an update on his foodstamp application. No other resources are needed at this time.   Advanced Directives Status:  Not addressed in this encounter.  Care Plan                 Allergies  Allergen Reactions   Aspirin Shortness Of Breath, Itching and Rash     Burning sensation (Patient reports he tolerates other NSAIDS)    Bee Venom Hives and Swelling   Lisinopril Cough   Shrimp (Diagnostic) Rash   Tomato Rash    Medications Reviewed Today     Reviewed by Gayla Medicus, RN (Registered Nurse) on 10/21/21 at [place] List Status: <None>   Medication Order Taking? Sig Documenting Provider Last Dose Status  Informant  Accu-Chek FastClix Lancets MISC [id number] No USE AS DIRECTED FOUR TIMES DAILY Vevelyn Francois, NP Taking Active   acetaminophen (TYLENOL) 325 MG tablet [id number] No Take 2 tablets (650 mg total) by mouth every 6 (six) hours as needed for fever. Darrick Grinder D, NP Taking Active Self  allopurinol (ZYLOPRIM) 100 MG tablet [id number] No Take 1 tablet (100 mg total) by mouth daily. Cathlyn Parsons, PA-C Taking Active Self  amiodarone (PACERONE) 200 MG tablet [id number] No Take 1 tablet (200 mg total) by mouth daily. Darrick Grinder D, NP Taking Active   colchicine 0.6 MG tablet [id number] No Take 1 tablet (0.6 mg total) by mouth daily. Darrick Grinder D, NP Taking Active   diclofenac Sodium (VOLTAREN) 1 % GEL [id number] No Apply 2 g topically 4 (four) times daily. Izora Ribas, MD Taking Active Self  docusate sodium (COLACE) 100 MG capsule [id number] No Take 1 capsule (100 mg total) by mouth 2 (two) times daily.  Patient not taking: Reported on 09/09/2021   Darrick Grinder D, NP Not Taking Active Self  enoxaparin (LOVENOX) 60 MG/0.6ML injection [id number] No Inject 0.6 mLs (60 mg total) into the skin every 12 (twelve) hours.  Patient not taking: Reported on 09/22/2021   Bensimhon, Shaune Pascal, MD Not Taking Active   gabapentin (NEURONTIN) 300 MG capsule [id number] No Take 1 capsule (300 mg total) by mouth 3 (three) times daily. Bensimhon, Shaune Pascal, MD Taking Active   glucose blood test strip [id number] No Use as directed 4  times daily. Bo Merino I, NP Taking Active   insulin aspart protamine - aspart (NOVOLOG 70/30 MIX) (70-30) 100 UNIT/ML FlexPen [id number] No Inject 38 Units into the skin 2 (two) times daily. [place], Maricela Bo, FNP Taking Active Self  Insulin Pen Needle (TRUEPLUS 5-BEVEL PEN NEEDLES) 29G X 12.7MM MISC [id number] No Use as directed in the morning, at noon, and at bedtime. Bo Merino I, NP Taking Active   Insulin Syringe-Needle U-100 (TRUEPLUS INSULIN SYRINGE) 31G X 5/16" 0.5 ML MISC  [id number] No USE TO INJECT INSULIN TWICE DAILY. Bo Merino I, NP Taking Active   isosorbide-hydrALAZINE (BIDIL) 20-37.5 MG tablet [id number] No Take 2 tablets by mouth 3 (three) times daily. Darrick Grinder D, NP Taking Active   meclizine (ANTIVERT) 25 MG tablet [id number] No Take 1 tablet (25 mg total) by mouth 3 (three) times daily as needed for dizziness.  Patient not taking: Reported on 09/22/2021   Darrick Grinder D, NP Not Taking Active   metFORMIN (GLUCOPHAGE) 1000 MG tablet [id number] No Take 1 tablet (1,000 mg total) by mouth 2 (two) times daily with a meal. [place], Maricela Bo, FNP Taking Active Self  pantoprazole (PROTONIX) 40 MG tablet [id number] No Take 1 tablet (40 mg total) by mouth daily.  Patient not taking: Reported on 09/12/2021   Cathlyn Parsons, PA-C Not Taking Active Self  polyethylene glycol (MIRALAX / GLYCOLAX) 17 g packet [id number] No Take 17 g by mouth daily.  Patient not taking: Reported on 09/09/2021   Cathlyn Parsons, PA-C Not Taking Active Self  sacubitril-valsartan (ENTRESTO) 49-51 MG [id number] No Take 1 tablet by mouth 2 (two) times daily. Clegg, Amy D, NP Taking Active   sertraline (ZOLOFT) 50 MG tablet [id number] No Take 1 tablet (50 mg total) by mouth daily. Bensimhon, Shaune Pascal, MD Taking Active   spironolactone (ALDACTONE) 25 MG tablet [id number] No Take 1 tablet (25 mg total) by mouth daily. Cathlyn Parsons, PA-C Taking Active Self  traMADol (ULTRAM) 50 MG tablet [id number] No Take 1-2 tablets (50-100 mg total) by mouth every 4 (four) hours as needed for moderate pain.  Patient not taking: Reported on 09/12/2021   Darrick Grinder D, NP Not Taking Active   traZODone (DESYREL) 100 MG tablet [id number]  Take 1 tablet (100 mg total) by mouth at bedtime. Bo Merino I, NP  Active   warfarin (COUMADIN) 4 MG tablet [id number] No Take 1 tablet (4 mg total) by mouth daily at 4 PM. Bensimhon, Shaune Pascal, MD Taking Active             Patient Active Problem List   Diagnosis  Date Noted   Presence of left ventricular assist device (LVAD) ([place]) 07/25/2021   Acute on chronic systolic CHF (congestive heart failure) ([place]) 07/18/2021   Cardiogenic shock (HCC)    Atrial fibrillation with RVR ([place]) 04/30/2021   Acute hypoxemic respiratory failure (HCC)    Right wrist pain 04/16/2021   Absolute anemia    Rectal bleeding    Iron deficiency 12/25/2020   Long term current use of anticoagulant - apixaban 12/25/2020   Paroxysmal atrial fibrillation (HCC)    Lactic acidosis 11/22/2020   Hypotension 11/22/2020   Aspiration into airway    ICD (implantable cardioverter-defibrillator) battery depletion 05/15/2020   Endotracheal tube present    Respiratory failure ([place])    Acute encephalopathy    Acute pulmonary edema (HCC)    NICM (nonischemic cardiomyopathy) ([place]) 04/24/2020   S/P vasectomy 12/20/2019   Anxiety 12/20/2019  Hemoglobin A1C between 7% and 9% indicating borderline diabetic control ([place]) 12/20/2019   Seasonal allergies 12/20/2019   Insomnia 12/20/2019   Vasectomy evaluation 06/20/2019   Visual problems 06/20/2019   Blurry vision, bilateral 06/20/2019   Hyperglycemia 02/13/2019   Hyperosmolar (nonketotic) coma ([place]) 01/19/2019   AICD (automatic cardioverter/defibrillator) present 01/19/2019   Hyperlipidemia 12/07/2018   Follow-up exam 11/22/2018   Class 3 severe obesity due to excess calories with serious comorbidity and body mass index (BMI) of 45.0 to 49.9 in adult ([place]) 11/22/2018   Dizziness 11/22/2018   Lingular pneumonia 08/04/2018   Ventricular tachycardia 12/30/2017   PAF (paroxysmal atrial fibrillation) ([place])    Dilated cardiomyopathy ([place])    Pollen allergy 02/17/2017   Dyslipidemia 02/17/2017   Hematochezia 11/08/2015   CKD (chronic kidney disease), stage III ([place]) 06/20/2015   Cardiac defibrillator in situ    Chronic systolic CHF (congestive heart failure) ([place]) 03/11/2015   Gouty arthritis 03/11/2015   Chest pain 12/28/2014   Acute  renal failure superimposed on stage 3 chronic kidney disease ([place]) 04/08/2014   Aspirin allergy 04/08/2014   Renal cell carcinoma ([place])    Gout attack 11/10/2011   Morbid obesity ([place]) 10/23/2011   OSA (obstructive sleep apnea) 10/23/2011   Type 2 diabetes mellitus with stage 3 chronic kidney disease, with long-term current use of insulin ([place]) 10/22/2011   Hyperlipidemia associated with type 2 diabetes mellitus ([employer]) 12/25/2010   Hypertension associated with diabetes ([place]) 12/25/2010    Conditions to be addressed/monitored per PCP order:   dental resources  Care Plan : General Plan of Care (Adult)  Updates made by Ethelda Chick since 10/22/2021 12:00 AM     Problem: Health Promotion or Disease Self-Management (General Plan of Care)   Priority: High  Onset Date: 09/24/2020  Note:   Current Barriers:  Chronic Disease Management support and education needs, CHF, HTN, CKD, OSA, gout, DM2, h/o renal cell carcinoma.  10/17/21:  Patient stable S/P LVAD in October, BP and CBG WNL.  Patient awaiting food stamp and disability information-in need of dental resources.  Nurse Case Manager Clinical Goal(s):  Over the next 90 days, patient will attend all scheduled medical appointments: Over the next 30 days, patient will meet with Pharmacist Over the next 30 days, patient will receive resources  Interventions:  Inter-disciplinary care team collaboration (see longitudinal plan of care) Collaborated with BSW regarding food stamps and dental resources. BSW referral for food stamp updates and dental resources. Evaluation of current treatment plan and patient's adherence to plan as established by provider.  BSW contacted patient regarding daycare voucher and foodstamps. BSW informed patient he would have to contact DSS and have the child's name placed on the waitlist, patient can also apply for foodstamps at the same time. Patient reports no other resources needed at this time.  09/04/21: BSW  spoke with patient, he stated he is not sure if they have been placed on the daycare voucher because his wife has not said anything to him yet. Patient stated they have applied for foodstamps but have not received them, he thinks its because his wife makes to much money. No other resources are needed at this time. 10/22/21: Bsw completed a follow up telephone call with patient regarding dental resources and follow up with his foodstamps. Patient stated he would like for dental resources to be mailed to him. Patient asked BSW if she could provide an update on his foodstamp application, BSW informed patient he would have to contact  DSS call center or his worker to get an update on his foodstamp application. No other resources are needed at this time. Reviewed medications with patient. Discussed plans with patient for ongoing care management follow up and provided patient with direct contact information for care management team Provided patient with educational materials related to exercise. Reviewed scheduled/upcoming provider appointments. Collaborated with Pharmacy Pharmacy referral for medication review  Patient Goals/Self-Care Activities Over the next 90 days, patient will:  -Attends all scheduled provider appointments Calls provider office for new concerns or questions  Follow Up Plan: The Managed Medicaid care management team will reach out to the patient again over the next 30 days.  The patient has been provided with contact information for the Managed Medicaid care management team and has been advised to call with any health related questions or concerns.         Follow up:  Patient agrees to Care Plan and Follow-up.  Plan: The Managed Medicaid care management team will reach out to the patient again over the next 30 days.  Date/time of next scheduled Social Work care management/care coordination outreach:  11/21/21  [name], Arita Miss, [place] Medicaid Team  [phone]

## 2021-10-23 ENCOUNTER — Ambulatory Visit (HOSPITAL_COMMUNITY)
Admission: RE | Admit: 2021-10-23 | Discharge: 2021-10-23 | Disposition: A | Discharge status: Home / Self Care | Payer: Medicaid Other | Source: Ambulatory Visit | Attending: Internal Medicine | Admitting: Internal Medicine

## 2021-10-23 ENCOUNTER — Ambulatory Visit (HOSPITAL_COMMUNITY): Payer: Self-pay | Admitting: Pharmacist

## 2021-10-23 ENCOUNTER — Other Ambulatory Visit: Payer: Self-pay

## 2021-10-23 DIAGNOSIS — Z95811 Presence of heart assist device: Secondary | ICD-10-CM | POA: Insufficient documentation

## 2021-10-23 DIAGNOSIS — Z4509 Encounter for adjustment and management of other cardiac device: Secondary | ICD-10-CM | POA: Diagnosis not present

## 2021-10-23 DIAGNOSIS — Z7901 Long term (current) use of anticoagulants: Secondary | ICD-10-CM

## 2021-10-23 LAB — COMPREHENSIVE METABOLIC PANEL
ALT: 21 U/L (ref 0–44)
AST: 20 U/L (ref 15–41)
Albumin: 3.9 g/dL (ref 3.5–5.0)
Alkaline Phosphatase: 65 U/L (ref 38–126)
Anion gap: 4 — ABNORMAL LOW (ref 5–15)
BUN: 33 mg/dL — ABNORMAL HIGH (ref 6–20)
CO2: 24 mmol/L (ref 22–32)
Calcium: 10.1 mg/dL (ref 8.9–10.3)
Chloride: 110 mmol/L (ref 98–111)
Creatinine, Ser: 1.55 mg/dL — ABNORMAL HIGH (ref 0.61–1.24)
GFR, Estimated: 56 mL/min — ABNORMAL LOW (ref >60)
Glucose, Bld: 86 mg/dL (ref 70–99)
Potassium: 4.5 mmol/L (ref 3.5–5.1)
Sodium: 138 mmol/L (ref 135–145)
Total Bilirubin: 0.6 mg/dL (ref 0.3–1.2)
Total Protein: 7.4 g/dL (ref 6.5–8.1)

## 2021-10-23 LAB — CBC
HCT: 39.1 % (ref 39.0–52.0)
Hemoglobin: 12.3 g/dL — ABNORMAL LOW (ref 13.0–17.0)
MCH: 24 pg — ABNORMAL LOW (ref 26.0–34.0)
MCHC: 31.5 g/dL (ref 30.0–36.0)
MCV: 76.4 fL — ABNORMAL LOW (ref 80.0–100.0)
Platelets: 244 10*3/uL (ref 150–400)
RBC: 5.12 MIL/uL (ref 4.22–5.81)
RDW: 23.1 % — ABNORMAL HIGH (ref 11.5–15.5)
WBC: 5.9 10*3/uL (ref 4.0–10.5)
nRBC: 0 % (ref 0.0–0.2)

## 2021-10-23 LAB — PROTIME-INR
INR: 1.5 — ABNORMAL HIGH (ref 0.8–1.2)
Prothrombin Time: 18.1 seconds — ABNORMAL HIGH (ref 11.4–15.2)

## 2021-10-23 LAB — LACTATE DEHYDROGENASE: LDH: 186 U/L (ref 98–192)

## 2021-10-23 MED ORDER — PANTOPRAZOLE SODIUM 40 MG PO TBEC
40.0000 mg | DELAYED_RELEASE_TABLET | Freq: Every day | ORAL | 11 refills | Status: DC
  Filled 2021-10-23: qty 30, 30d supply, fill #0

## 2021-10-23 MED ORDER — SERTRALINE HCL 100 MG PO TABS
100.0000 mg | ORAL_TABLET | Freq: Every day | ORAL | 6 refills | Status: DC
  Filled 2021-10-23: qty 15, 15d supply, fill #0
  Filled 2021-11-12 – 2021-11-20 (×2): qty 15, 15d supply, fill #1

## 2021-10-23 MED ORDER — ISOSORB DINITRATE-HYDRALAZINE 20-37.5 MG PO TABS
1.0000 | ORAL_TABLET | Freq: Three times a day (TID) | ORAL | 6 refills | Status: DC
  Filled 2021-10-23: qty 180, 60d supply, fill #0

## 2021-10-23 NOTE — Patient Instructions (Signed)
Decrease Bidil to 1 tablet 3x a day Increase Zoloft to 100 mg daily May advance to weekly dressing changes Return to clinic in 2 weeks for BP check - if pt doing well with weekly dressing change may allow pt to shower.  Return to clinic in 1 month

## 2021-10-23 NOTE — Progress Notes (Signed)
Patient presents for 2 week follow up in [facility] today with his sister. Reports no problems with VAD equipment or concerns with drive line.  Patient walked in clinic without assistance. Denies syncopal episodes, falls, or signs of bleeding. Reports mild lightheadedness/dizziness intermittently. Orthostatics checked in clinic today.  60+ PI events noted on 1/17 VAD interrogation. But overall 3-5 daily.  Reports pocket pain is resolved with Gabapentin increase.    Pt reports his main issue today is feeling down and depressed. He is currently taking Zoloft to 50 mg daily.   Pts weight is up 2 lbs today in clinic. Pt tells me that he has a goal weight of 225 lbs.   Vital Signs:  Doppler Pressure: 96  Automatc BP: 94/66 (76) HR: 81 NSR SPO2: 99% on RA  Lying: 94-66 (76) Sitting: 124/71 (89) Standing: 87/62 (69)   Weight: 303.8 lb w/ eqt Last weight:  301.6 lbs Home weights: 281 - 274 lbs   VAD Indication: Destination Therapy due to smoking and BMI   VAD interrogation & Equipment Management: Speed: 6200 Flow: 5.0 Power: 5.3 w    PI: 2.8   Alarms: no clinical alarms Events: 3-5 daily: 1/17 pt had 60+ PI  Fixed speed 6200 Low speed limit: 5900   Primary Controller:  Replace back up battery in 27 months. Back up controller:   Replace back up battery in 30 months.   Annual Equipment Maintenance on UBC/PM was performed on 07/21/21.    I reviewed the LVAD parameters from today and compared the results to the patient's prior recorded data. LVAD interrogation was NEGATIVE for significant power changes, NEGATIVE for clinical alarms and POSITIVE for PI events/speed drops. No programming changes were made and pump is functioning within specified parameters. Pt is performing daily controller and system monitor self tests along with completing weekly and monthly maintenance for LVAD equipment.   LVAD equipment check completed and is in good working order. Back-up equipment present.     Exit Site Care: Drive line is being maintained every other day by [name] his wife. Existing VAD dressing removed and site care performed using sterile technique. Drive line exit site cleaned with Chlora prep applicators x 2, allowed to dry, and Sorbaview dressing with Silverlon patch applied. Exit site healed and incorporated, the velour is fully implanted at exit site.  No redness, tenderness, drainage, foul odor or rash noted. Drive line anchor re-applied. May advance to weekly dressing changes. Pt given 8 weekly kits for home use.   Device: BS Therapies: on at 250 BPM Last check: in hosp    BP & Labs:  Doppler 96 - Doppler is reflecting MAP   Hgb 12.3 - No S/S of bleeding. Specifically denies melena/BRBPR or nosebleeds.   LDH stable at 186 with established baseline of 160 - 250. Denies tea-colored urine. No power elevations noted on interrogation.   3 mo Intermacs follow up completed including:  Quality of Life, KCCQ-12, and Neurocognitive trail making.   Pt completed 1200 feet during 6 minute walk.  [facility] Cardiomyopathy Questionnaire  KCCQ-12 10/23/2021  1 a. Ability to shower/bathe Extremely limited  1 b. Ability to walk 1 block Slightly limited  1 c. Ability to hurry/jog Quite a bit limited  2. Edema feet/ankles/legs Never over the past 2 weeks  3. Limited by fatigue 3+ times per week, not every day  4. Limited by dyspnea Less than once a week  5. Sitting up / on 3+ pillows Never over the past  2 weeks  6. Limited enjoyment of life Moderately limited  7. Rest of life w/ symptoms Mostly satisfied  8 a. Participation in hobbies Limited quite a bit  8 b. Participation in chores Limited quite a bit  8 c. 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 Goal weight of 225 lbs  Plan:  Decrease Bidil to 1 tablet 3x a day Increase Zoloft to 100 mg daily May advance to weekly dressing changes Return to clinic  in 2 weeks for BP check - if pt doing well with weekly dressing change may allow pt to shower.  Return to clinic in 1 month   [place] Coordinator  Office: [phone]  24/7 Pager: [phone]

## 2021-10-23 NOTE — Progress Notes (Signed)
LVAD INR 

## 2021-10-24 ENCOUNTER — Ambulatory Visit: Payer: Self-pay

## 2021-10-27 ENCOUNTER — Encounter (HOSPITAL_COMMUNITY)
Admission: RE | Admit: 2021-10-27 | Discharge: 2021-10-27 | Disposition: A | Discharge status: Home / Self Care | Payer: Medicaid Other | Source: Ambulatory Visit | Attending: Internal Medicine | Admitting: Internal Medicine

## 2021-10-27 ENCOUNTER — Other Ambulatory Visit: Payer: Self-pay

## 2021-10-27 DIAGNOSIS — D509 Iron deficiency anemia, unspecified: Secondary | ICD-10-CM | POA: Diagnosis not present

## 2021-10-27 DIAGNOSIS — Z95811 Presence of heart assist device: Secondary | ICD-10-CM | POA: Diagnosis not present

## 2021-10-27 DIAGNOSIS — E611 Iron deficiency: Secondary | ICD-10-CM | POA: Diagnosis not present

## 2021-10-27 MED ORDER — SODIUM CHLORIDE 0.9 % IV SOLN
510.0000 mg | INTRAVENOUS | Status: DC
  Administered 2021-10-27: 510 mg via INTRAVENOUS
  Filled 2021-10-27: qty 17

## 2021-10-27 NOTE — Progress Notes (Signed)
Patient called at 1022 stating he needed [place] to call [facility] [employer] for him for his appt today at 1:00.  I called [facility] [employer] and had to leave a message for them to call the patient back at [phone], that he had a 1:00 apt with [place] today, and needed to be picked up at 12:30 today.

## 2021-10-28 ENCOUNTER — Other Ambulatory Visit: Payer: Self-pay

## 2021-10-31 ENCOUNTER — Other Ambulatory Visit (HOSPITAL_COMMUNITY): Payer: Self-pay | Admitting: *Deleted

## 2021-10-31 DIAGNOSIS — Z95811 Presence of heart assist device: Secondary | ICD-10-CM

## 2021-10-31 DIAGNOSIS — Z7901 Long term (current) use of anticoagulants: Secondary | ICD-10-CM

## 2021-10-31 DIAGNOSIS — I5022 Chronic systolic (congestive) heart failure: Secondary | ICD-10-CM

## 2021-11-03 ENCOUNTER — Other Ambulatory Visit: Payer: Self-pay

## 2021-11-04 DIAGNOSIS — Z0271 Encounter for disability determination: Secondary | ICD-10-CM

## 2021-11-05 ENCOUNTER — Ambulatory Visit (HOSPITAL_COMMUNITY)
Admission: RE | Admit: 2021-11-05 | Discharge: 2021-11-05 | Disposition: A | Discharge status: Home / Self Care | Payer: Medicaid Other | Source: Ambulatory Visit | Attending: Cardiology | Admitting: Cardiology

## 2021-11-05 ENCOUNTER — Other Ambulatory Visit: Payer: Self-pay

## 2021-11-05 ENCOUNTER — Encounter (HOSPITAL_COMMUNITY): Payer: Self-pay

## 2021-11-05 ENCOUNTER — Ambulatory Visit (HOSPITAL_COMMUNITY): Payer: Self-pay | Admitting: Pharmacist

## 2021-11-05 DIAGNOSIS — Z7901 Long term (current) use of anticoagulants: Secondary | ICD-10-CM

## 2021-11-05 DIAGNOSIS — Z95811 Presence of heart assist device: Secondary | ICD-10-CM

## 2021-11-05 DIAGNOSIS — I5022 Chronic systolic (congestive) heart failure: Secondary | ICD-10-CM | POA: Diagnosis not present

## 2021-11-05 DIAGNOSIS — Z4801 Encounter for change or removal of surgical wound dressing: Secondary | ICD-10-CM | POA: Diagnosis not present

## 2021-11-05 LAB — PROTIME-INR
INR: 1.9 — ABNORMAL HIGH (ref 0.8–1.2)
Prothrombin Time: 21.3 seconds — ABNORMAL HIGH (ref 11.4–15.2)

## 2021-11-05 NOTE — Progress Notes (Signed)
Patient presents for BP and DL check along with INR in VAD Clinic today alone. Reports no problems with VAD equipment or concerns with drive line.  Patient walked in clinic without assistance.   Patient confirms he decreased Bidil to one tablet three times daily as instructed last clinic visit. He denies lightheadedness, dizziness, or syncopal episodes. He is drinking 2.5 liters fluid/day with no swelling, weight gain, or need for diuretics. Orthostatic checked in clinic today.  Vital Signs:   Sitting:   Standing: Doppler Pressure:   90    Automatc BP:   113/98 (105)  93/72 (81) HR:    82   90 SPO2: 97% on RA    Exit Site Care: Drive line is being maintained weekly per his wife at home; last changed Thursday of last week. Existing VAD dressing removed and site care performed using sterile technique. Drive line exit site cleaned with Chlora prep applicators x 2, allowed to dry, and Sorbaview dressing with Silverlon patch applied. Exit site healed and incorporated, the velour is fully implanted at exit site.  No redness, tenderness, drainage, foul odor or rash noted. Drive line anchor re-applied. Patient has adequate dressing supplies at home.   Patient may start showering. Demonstrated use of shower bag along with written instructions given (as below). Pt verbalized understand of same ane will call if any issues arise.        Plan:  No changes in medications May start showering using equipment provided.  Return to clinic at scheduled 1 month f/u appointment.  Lauren (PharmD) will call you with INR results and warfarin dosing/follow up.    Zada Girt RN [place] Coordinator  Office: [phone]  24/7 Pager: [phone]

## 2021-11-05 NOTE — Progress Notes (Signed)
CSW met with patient in the clinic to discuss transportation needs and Support Groups. CSW shared contact information for [facility] transport and discussed process to utilize the service if needed. CSW also discussed the HF Men's Support Group as well as the [facility] LVAD Support Group. Patient is interested in both groups and provided his e-mail to add to the monthly emails. Patient appears in good spirits and will come to group to take advantage of peer support. CSW continues to follow as needed. Raquel Sarna, [place], [place]

## 2021-11-05 NOTE — Progress Notes (Signed)
LVAD INR 

## 2021-11-07 ENCOUNTER — Other Ambulatory Visit: Payer: Self-pay

## 2021-11-12 ENCOUNTER — Other Ambulatory Visit: Payer: Self-pay | Admitting: Nurse Practitioner

## 2021-11-12 ENCOUNTER — Other Ambulatory Visit: Payer: Self-pay

## 2021-11-14 ENCOUNTER — Other Ambulatory Visit: Payer: Self-pay

## 2021-11-14 MED ORDER — TRAZODONE HCL 100 MG PO TABS
100.0000 mg | ORAL_TABLET | Freq: Every day | ORAL | 0 refills | Status: DC
  Filled 2021-11-14: qty 30, 30d supply, fill #0

## 2021-11-17 ENCOUNTER — Other Ambulatory Visit: Payer: Self-pay | Admitting: Obstetrics and Gynecology

## 2021-11-17 NOTE — Patient Instructions (Signed)
Hey Mr. Leisinger, sorry I missed you today-I hope you are doing okay - as a part of your Medicaid benefit, you are eligible for care management and care coordination services at no cost or copay. I was unable to reach you by phone today but would be happy to help you with your health related needs. Please feel free to call me at [phone].  A member of the Managed Medicaid care management team will reach out to you again over the next 7-14  days.   [name] RN, BSN [place]   [facility] Curator - Managed Medicaid High Risk [phone].

## 2021-11-17 NOTE — Patient Outreach (Signed)
Care Coordination  11/17/2021  NEVILLE WALSTON 01-11-1976 [id number]   Medicaid Managed Care   Unsuccessful Outreach Note  11/17/2021 Name: ALISTER STAVER MRN: [id number] DOB: 11-Apr-1976  Referred by: Teena Dunk, NP Reason for referral : High Risk Managed Medicaid (Unsuccessful telephone outreach)   An unsuccessful telephone outreach was attempted today. The patient was referred to the case management team for assistance with care management and care coordination.   Follow Up Plan: The care management team will reach out to the patient again over the next 7-14 days.   [name] RN, BSN [place]   [facility] Curator - Managed Medicaid High Risk [phone]

## 2021-11-19 ENCOUNTER — Other Ambulatory Visit: Payer: Self-pay

## 2021-11-20 ENCOUNTER — Other Ambulatory Visit (HOSPITAL_COMMUNITY): Payer: Self-pay | Admitting: Unknown Physician Specialty

## 2021-11-20 ENCOUNTER — Other Ambulatory Visit: Payer: Self-pay

## 2021-11-20 ENCOUNTER — Other Ambulatory Visit: Payer: Self-pay | Admitting: Obstetrics and Gynecology

## 2021-11-20 DIAGNOSIS — Z95811 Presence of heart assist device: Secondary | ICD-10-CM

## 2021-11-20 DIAGNOSIS — Z7901 Long term (current) use of anticoagulants: Secondary | ICD-10-CM

## 2021-11-20 NOTE — Patient Outreach (Signed)
Care Coordination  11/20/2021  Jeffrey [name] 03/17/1976 [id number]   Medicaid Managed Care   Unsuccessful Outreach Note  11/20/2021 Name: Jeffrey [name] MRN: [id number] DOB: [birth year]  Referred by: Teena Dunk, NP Reason for referral : High Risk Managed Medicaid (Unsuccessful telephone outreach)   A second unsuccessful telephone outreach was attempted today. The patient was referred to the case management team for assistance with care management and care coordination.   Follow Up Plan: The care management team will reach out to the patient again over the next 7-14 days.   [name] RN, BSN [place]   [facility] Curator - Managed Medicaid High Risk [phone].

## 2021-11-20 NOTE — Patient Instructions (Signed)
Hey Mr. Danzer hope you are doing well-I am sorry I have missed you today- as a part of your Medicaid benefit, you are eligible for care management and care coordination services at no cost or copay. I was unable to reach you by phone today but would be happy to help you with your health related needs. Please feel free to call me at [phone]  A member of the Managed Medicaid care management team will reach out to you again over the next 7-14 days.   [name] RN, BSN [place]   [facility] Curator - Managed Medicaid High Risk [phone]

## 2021-11-21 ENCOUNTER — Other Ambulatory Visit: Payer: Self-pay

## 2021-11-24 ENCOUNTER — Ambulatory Visit (HOSPITAL_COMMUNITY)
Admission: RE | Admit: 2021-11-24 | Discharge: 2021-11-24 | Disposition: A | Discharge status: Home / Self Care | Payer: Medicaid Other | Source: Ambulatory Visit | Attending: Cardiology | Admitting: Cardiology

## 2021-11-24 ENCOUNTER — Other Ambulatory Visit: Payer: Self-pay

## 2021-11-24 ENCOUNTER — Ambulatory Visit (HOSPITAL_COMMUNITY): Payer: Self-pay | Admitting: Pharmacist

## 2021-11-24 VITALS — BP 120/0 | HR 73 | Ht 67.0 in | Wt 315.8 lb

## 2021-11-24 DIAGNOSIS — Z7984 Long term (current) use of oral hypoglycemic drugs: Secondary | ICD-10-CM | POA: Insufficient documentation

## 2021-11-24 DIAGNOSIS — Z79899 Other long term (current) drug therapy: Secondary | ICD-10-CM | POA: Insufficient documentation

## 2021-11-24 DIAGNOSIS — I428 Other cardiomyopathies: Secondary | ICD-10-CM | POA: Insufficient documentation

## 2021-11-24 DIAGNOSIS — I5022 Chronic systolic (congestive) heart failure: Secondary | ICD-10-CM | POA: Diagnosis not present

## 2021-11-24 DIAGNOSIS — E1165 Type 2 diabetes mellitus with hyperglycemia: Secondary | ICD-10-CM | POA: Diagnosis not present

## 2021-11-24 DIAGNOSIS — Z7901 Long term (current) use of anticoagulants: Secondary | ICD-10-CM

## 2021-11-24 DIAGNOSIS — I480 Paroxysmal atrial fibrillation: Secondary | ICD-10-CM | POA: Diagnosis not present

## 2021-11-24 DIAGNOSIS — I130 Hypertensive heart and chronic kidney disease with heart failure and stage 1 through stage 4 chronic kidney disease, or unspecified chronic kidney disease: Secondary | ICD-10-CM | POA: Insufficient documentation

## 2021-11-24 DIAGNOSIS — M109 Gout, unspecified: Secondary | ICD-10-CM | POA: Insufficient documentation

## 2021-11-24 DIAGNOSIS — E1122 Type 2 diabetes mellitus with diabetic chronic kidney disease: Secondary | ICD-10-CM | POA: Diagnosis not present

## 2021-11-24 DIAGNOSIS — F17200 Nicotine dependence, unspecified, uncomplicated: Secondary | ICD-10-CM | POA: Insufficient documentation

## 2021-11-24 DIAGNOSIS — Z95811 Presence of heart assist device: Secondary | ICD-10-CM

## 2021-11-24 DIAGNOSIS — I10 Essential (primary) hypertension: Secondary | ICD-10-CM | POA: Diagnosis not present

## 2021-11-24 DIAGNOSIS — F32A Depression, unspecified: Secondary | ICD-10-CM | POA: Insufficient documentation

## 2021-11-24 DIAGNOSIS — Z6841 Body Mass Index (BMI) 40.0 and over, adult: Secondary | ICD-10-CM | POA: Diagnosis not present

## 2021-11-24 DIAGNOSIS — F4321 Adjustment disorder with depressed mood: Secondary | ICD-10-CM | POA: Diagnosis not present

## 2021-11-24 DIAGNOSIS — Z9989 Dependence on other enabling machines and devices: Secondary | ICD-10-CM | POA: Insufficient documentation

## 2021-11-24 DIAGNOSIS — G4733 Obstructive sleep apnea (adult) (pediatric): Secondary | ICD-10-CM | POA: Diagnosis not present

## 2021-11-24 DIAGNOSIS — N1832 Chronic kidney disease, stage 3b: Secondary | ICD-10-CM | POA: Diagnosis not present

## 2021-11-24 LAB — CBC
HCT: 42.3 % (ref 39.0–52.0)
Hemoglobin: 13.5 g/dL (ref 13.0–17.0)
MCH: 26.5 pg (ref 26.0–34.0)
MCHC: 31.9 g/dL (ref 30.0–36.0)
MCV: 82.9 fL (ref 80.0–100.0)
Platelets: 210 10*3/uL (ref 150–400)
RBC: 5.1 MIL/uL (ref 4.22–5.81)
RDW: 24 % — ABNORMAL HIGH (ref 11.5–15.5)
WBC: 5.2 10*3/uL (ref 4.0–10.5)
nRBC: 0 % (ref 0.0–0.2)

## 2021-11-24 LAB — BASIC METABOLIC PANEL
Anion gap: 6 (ref 5–15)
BUN: 32 mg/dL — ABNORMAL HIGH (ref 6–20)
CO2: 23 mmol/L (ref 22–32)
Calcium: 9.7 mg/dL (ref 8.9–10.3)
Chloride: 109 mmol/L (ref 98–111)
Creatinine, Ser: 1.48 mg/dL — ABNORMAL HIGH (ref 0.61–1.24)
GFR, Estimated: 58 mL/min — ABNORMAL LOW (ref >60)
Glucose, Bld: 141 mg/dL — ABNORMAL HIGH (ref 70–99)
Potassium: 4.6 mmol/L (ref 3.5–5.1)
Sodium: 138 mmol/L (ref 135–145)

## 2021-11-24 LAB — PROTIME-INR
INR: 1.5 — ABNORMAL HIGH (ref 0.8–1.2)
Prothrombin Time: 18.4 seconds — ABNORMAL HIGH (ref 11.4–15.2)

## 2021-11-24 LAB — LACTATE DEHYDROGENASE: LDH: 291 U/L — ABNORMAL HIGH (ref 98–192)

## 2021-11-24 MED ORDER — ISOSORB DINITRATE-HYDRALAZINE 20-37.5 MG PO TABS
2.0000 | ORAL_TABLET | Freq: Three times a day (TID) | ORAL | 6 refills | Status: DC
  Filled 2021-11-24: qty 180, 30d supply, fill #0
  Filled 2022-01-05: qty 180, 30d supply, fill #1
  Filled 2022-03-03: qty 180, 30d supply, fill #2
  Filled 2022-03-20 – 2022-03-30 (×2): qty 180, 30d supply, fill #3
  Filled 2022-05-20: qty 180, 30d supply, fill #4
  Filled 2022-07-06 – 2022-07-14 (×2): qty 180, 30d supply, fill #5

## 2021-11-24 MED ORDER — WARFARIN SODIUM 4 MG PO TABS
ORAL_TABLET | ORAL | 3 refills | Status: DC
  Filled 2021-11-24: qty 180, fill #0
  Filled 2022-01-26 – 2022-01-28 (×4): qty 180, 60d supply, fill #0
  Filled 2022-05-15: qty 150, 90d supply, fill #1
  Filled 2022-05-15: qty 180, 90d supply, fill #1

## 2021-11-24 MED ORDER — SERTRALINE HCL 100 MG PO TABS
150.0000 mg | ORAL_TABLET | Freq: Every day | ORAL | 6 refills | Status: DC
  Filled 2021-11-24 – 2021-12-03 (×2): qty 45, 30d supply, fill #0
  Filled 2022-01-20: qty 45, 30d supply, fill #1
  Filled 2022-03-03: qty 45, 30d supply, fill #2
  Filled 2022-04-06: qty 45, 30d supply, fill #3
  Filled 2022-05-20: qty 45, 30d supply, fill #4
  Filled 2022-07-02: qty 45, 30d supply, fill #5

## 2021-11-24 NOTE — Progress Notes (Signed)
LVAD INR 

## 2021-11-24 NOTE — Progress Notes (Signed)
Patient presents for 1 mo follow up in [facility] today with his sister. Reports no problems with VAD equipment or concerns with drive line.  Patient walked in clinic without assistance. Denies syncopal episodes, falls, or signs of bleeding. Reports mild lightheadedness/dizziness intermittently and states that this happens when he is just sitting on the couch.   Pt reports his main issue today is feeling down and depressed. He also said this at his last visit and we increased the Zoloft to 100 mg daily. Pt states that this med increased helped his mood for awhile but he states that he has had 3 deaths in his family recently and this has been difficult. Raquel Sarna LCSW in to see the pt.  Pts weight is up 12 lbs today in clinic since his last visit a month ago . Pt tells me that he has a goal weight of 225 lbs. His BP is also up today.   Vital Signs:  Doppler Pressure: 120  Automatc BP: 122/76 (99) HR: 73 NSR SPO2: 95% on RA   Weight: 315.8 lb w/ eqt Last weight:  303.8 lbs w/equt   VAD Indication: Destination Therapy due to smoking and BMI   VAD interrogation & Equipment Management: Speed: 6200 Flow: 4.5 Power: 5.2 w    PI: 4.4   Alarms: no clinical alarms Events: 20-30 daily  Fixed speed 6200 Low speed limit: 5900   Primary Controller:  Replace back up battery in 26 months. Back up controller:   Replace back up battery in 29 months.   Annual Equipment Maintenance on UBC/PM was performed on 07/21/21.    I reviewed the LVAD parameters from today and compared the results to the patient's prior recorded data. LVAD interrogation was NEGATIVE for significant power changes, NEGATIVE for clinical alarms and POSITIVE for PI events/speed drops. No programming changes were made and pump is functioning within specified parameters. Pt is performing daily controller and system monitor self tests along with completing weekly and monthly maintenance for LVAD equipment.   LVAD equipment  check completed and is in good working order. Back-up equipment present.    Exit Site Care: Drive line is being maintained weekly by [name] his wife. Drive line anchor secure.  Pt given 8 weekly kits for home use.   Device: BS Therapies: on at 250 BPM Last check: in hosp    BP & Labs:  Doppler 120 - Doppler is reflecting Modified systolic   Hgb 76.7 - No S/S of bleeding. Specifically denies melena/BRBPR or nosebleeds.   LDH stable at 291 with established baseline of 160 - 300. Denies tea-colored urine. No power elevations noted on interrogation.   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

## 2021-11-24 NOTE — Patient Instructions (Signed)
Increase Bidil to 2 pills 3x a day Increase Zoloft to 150 mg daily (1.5 pills) We will send referral to [facility] Return to clinic in 1 month

## 2021-11-24 NOTE — Progress Notes (Signed)
VAD CLINIC Follow-up Visit  PCP: Teena Dunk, NP HF MD: DB   HPI:  Jeffrey [name] is a 46 y.o. male with a history of  poorly controlled HTN, R renal cell carcinoma s/p nephrectomy, CKD IIIa, DM2, OSA, gout, morbid obesity and systolic HF due to NICM. S/p HM-3 VAD placement 07/21/21  Has [place]-sci S-ICD   HF dates back to 2012 with EF 25%   Admitted 7/22 with CHF/cardiogenic shock in setting AF with RVR. He was placed on amio gtt and underwent cardioversion. Echo EF < 20%, severe LV dilation, moderate RV dysfunction. Required NE and dobutamine, which were eventually weaned off. Hospitalization c/b by acute respiratory failure with hypoxia requiring mechanical ventilation. GDMT was limited by hypotension and VAD workup initiated. Creatinine peaked at 4.65 improved to 1.58 which is his baseline. He was discharged to CIR, weight 299 lbs.   He was presented at [facility] and discussed with [facility] transplant team and he was approved for DT VAD. Underwent HM3 LVAD by Dr. Cyndia Bent on 07/21/21. Had post-op AF requiring DC-CV.  Follow up for Heart Failure/LVAD:  Here for f/u. Feels pretty good denies SOB, orthopnea or PND. Only major complaint is that he is suffering from worsening depression and feeling so uselessness. Denies orthopnea or PND. No fevers, chills or problems with driveline. No bleeding, melena or neuro symptoms. No VAD alarms. Taking all meds as prescribed.      VAD Indication: Destination Therapy due to smoking and BMI   VAD interrogation & Equipment Management: Speed: 6200 Flow: 4.5 Power: 5.2 w    PI: 4.4   Alarms: no clinical alarms Events: 20-30 daily  Fixed speed 6200 Low speed limit: 5900   Primary Controller:  Replace back up battery in 26 months. Back up controller:   Replace back up battery in 29 months.   Annual Equipment Maintenance on UBC/PM was performed on 07/21/21.    Past Medical History:  Diagnosis Date   Anxiety    Aspirin allergy     Childhood asthma    Chronic systolic CHF (congestive heart failure) ([place])    a. EF 20-25% in 2012. b. EF 45-50% in 10/2011 with nonischemic nuc - presumed NICM. c. 12/2014 Echo: Sev depressed LV fxn, sev dil LV, mild LVH, mild MR, sev dil LA, mildly reduced RV fxn.   CKD (chronic kidney disease) stage 2, GFR 60-89 ml/min    H/O vasectomy 12/2019   High cholesterol    Hypertension    Morbid obesity ([place])    Nephrolithiasis    OSA on CPAP    Paroxysmal atrial fibrillation ([place])    Presumed NICM    a. 04/2014 Myoview: EF 26%, glob HK, sev glob HK, ? prior infarct;  b. Never cathed 2/2 CKD.   Renal cell carcinoma ([place])    a. s/p Rt robotic assisted partial converted to radical nephrectomy on 01/2013.   Troponin level elevated    a. 04/2014, 12/2014: felt due to CHF.   Type II diabetes mellitus ([facility])    Ventricular tachycardia    a. appropriate ICD therapy 12/2017    Current Outpatient Medications  Medication Sig Dispense Refill   Accu-Chek FastClix Lancets MISC USE AS DIRECTED FOUR TIMES DAILY 102 each 11   acetaminophen (TYLENOL) 325 MG tablet Take 2 tablets (650 mg total) by mouth every 6 (six) hours as needed for fever.     allopurinol (ZYLOPRIM) 100 MG tablet Take 1 tablet (100 mg total) by mouth  daily. 90 tablet 3   amiodarone (PACERONE) 200 MG tablet Take 1 tablet (200 mg total) by mouth daily. 30 tablet 6   colchicine 0.6 MG tablet Take 1 tablet (0.6 mg total) by mouth daily. 30 tablet 6   diclofenac Sodium (VOLTAREN) 1 % GEL Apply 2 g topically 4 (four) times daily. 100 g 0   gabapentin (NEURONTIN) 300 MG capsule Take 1 capsule (300 mg total) by mouth 3 (three) times daily. 90 capsule 6   glucose blood test strip Use as directed 4 times daily. 100 each 11   insulin aspart protamine - aspart (NOVOLOG 70/30 MIX) (70-30) 100 UNIT/ML FlexPen Inject 38 Units into the skin 2 (two) times daily. 15 mL 11   Insulin Pen Needle (TRUEPLUS 5-BEVEL PEN NEEDLES) 29G X 12.7MM MISC Use as directed in  the morning, at noon, and at bedtime. 100 each 3   Insulin Syringe-Needle U-100 (TRUEPLUS INSULIN SYRINGE) 31G X 5/16" 0.5 ML MISC USE TO INJECT INSULIN TWICE DAILY. 100 each 11   metFORMIN (GLUCOPHAGE) 1000 MG tablet Take 1 tablet (1,000 mg total) by mouth 2 (two) times daily with a meal. 60 tablet 11   pantoprazole (PROTONIX) 40 MG tablet Take 1 tablet (40 mg total) by mouth daily. 30 tablet 11   sacubitril-valsartan (ENTRESTO) 49-51 MG Take 1 tablet by mouth 2 (two) times daily. 60 tablet 6   spironolactone (ALDACTONE) 25 MG tablet Take 1 tablet (25 mg total) by mouth daily. 30 tablet 6   traZODone (DESYREL) 100 MG tablet Take 1 tablet (100 mg total) by mouth at bedtime. 30 tablet 0   docusate sodium (COLACE) 100 MG capsule Take 1 capsule (100 mg total) by mouth 2 (two) times daily. (Patient not taking: Reported on 09/09/2021) 10 capsule 0   enoxaparin (LOVENOX) 60 MG/0.6ML injection Inject 0.6 mLs (60 mg total) into the skin every 12 (twelve) hours. (Patient not taking: Reported on 09/22/2021) 6 mL 0   isosorbide-hydrALAZINE (BIDIL) 20-37.5 MG tablet Take 2 tablets by mouth 3 (three) times daily. 180 tablet 6   meclizine (ANTIVERT) 25 MG tablet Take 1 tablet (25 mg total) by mouth 3 (three) times daily as needed for dizziness. (Patient not taking: Reported on 09/22/2021) 30 tablet 0   polyethylene glycol (MIRALAX / GLYCOLAX) 17 g packet Take 17 g by mouth daily. (Patient not taking: Reported on 09/09/2021) 14 each 0   sertraline (ZOLOFT) 100 MG tablet Take 1.5 tablets (150 mg total) by mouth daily. 45 tablet 6   traMADol (ULTRAM) 50 MG tablet Take 1-2 tablets (50-100 mg total) by mouth every 4 (four) hours as needed for moderate pain. (Patient not taking: Reported on 09/12/2021) 30 tablet 0   warfarin (COUMADIN) 4 MG tablet Take 8 mg (2 tablets) every Monday/Wednesday/Friday and 4 mg (1 tablet) all other days or as directed by HF clinic 180 tablet 3   No current facility-administered medications for  this encounter.    Aspirin, Bee venom, Lisinopril, Shrimp (diagnostic), and Tomato  REVIEW OF SYSTEMS: All systems negative except as listed in HPI, PMH and Problem list.   Vitals:   11/24/21 1147 11/24/21 1148  BP: 122/76 (!) 120/0  Pulse: 73   SpO2: 95%   Weight: (!) 143.2 kg (315 lb 12.8 oz)   Height: 5\' 7"  (1.702 m)     Vital Signs:  Doppler Pressure: 120             Automatc BP: 122/76 (99) HR: 73 NSR SPO2: 95% on  RA   Weight: 315.8 lb w/ eqt Last weight:  303.8 lbs w/equt      General:  NAD.  HEENT: normal  Neck: supple. JVP not elevated.  Carotids 2+ bilat; no bruits. No lymphadenopathy or thryomegaly appreciated. Cor: LVAD hum.  Lungs: Clear. Abdomen: obese soft, nontender, non-distended. No hepatosplenomegaly. No bruits or masses. Good bowel sounds. Driveline site clean. Anchor in place.  Extremities: no cyanosis, clubbing, rash. Warm no edema  Neuro: alert & oriented x 3. No focal deficits. Moves all 4 without problem      ASSESSMENT AND PLAN:   1. Chronic systolic CHF - Nonischemic CM, end-stage - EF 2012; 20-25%  - Echo  8/22: EF < 20%, severe LV dilation, moderate RV dysfunction.   - RHC (8/22): well compensated hemodynamics. - s/p S-ICD - S/p HM3 LVAD 10/17 (DT) - Continues to improve. NYHA I-II. Weight up but volume status looks ok.  - We recently decreased Bidil to 1 tab tid due to low PMAs but MAPS back up. - Increase Bidil to 2 tabs tid - Continue spironolactone 25 mg daily and entresto 49-51 mg BID.  - Stressed need to renew weight loss efforts. Once BMI ~ 35 will refer to [facility] for begin transplant evaluation (Body mass index is 49.46 kg/m.)   2. VAD  - s/p HM-3 placement 07/21/21  - VAD interrogated personally. Parameters stable. - INR 1.5 Goal INR 2.0-3.0 Discussed dosing with PharmD personally. - LDH 291 - DL site ok - Hgb 13.5 MCK now > 80   3 Atrial fibrillation: Paroxysmal - Had severe AF with RVR 7/22 s/p DC-CV x 2 - Seen by  EP. Not a candidate for ablation due to body habitus  (could consider AVN ablation and CRT, if needed)  - Had post-op AF with RVR post VAD - 10/23 S/P DC-CV with restoration of NSR. - Remains in NSR. Continue amio 100 daily - On warfarin. INR 1.5   4. HTN - MAPs elevated. Increase bidil back to 2 tid  5. CKD stage 3B  - Has solitary kidney.   - Baseline creatinine is 1.6-2.9.  - Creatinine 1.48 today.    6. OSA - Continue CPAP   7. DM2. Poorly controlled - On Jardiance. - Hgb A1c 9.4. -> 5.9 - PCP following.  8. Polyarticular gout. - quiescent  9. Depression/anxiety - Counseled him that this is not infrequent post-op symptom. Stressed need to remain active and interact with others. 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 Over half that time spent discussing above.   Glori Bickers, MD  2:58 PM

## 2021-11-26 ENCOUNTER — Encounter (HOSPITAL_COMMUNITY): Payer: Self-pay

## 2021-11-26 ENCOUNTER — Telehealth: Payer: Self-pay | Admitting: Nurse Practitioner

## 2021-11-27 ENCOUNTER — Other Ambulatory Visit: Payer: Self-pay | Admitting: Nurse Practitioner

## 2021-11-27 ENCOUNTER — Other Ambulatory Visit: Payer: Self-pay

## 2021-11-27 MED ORDER — DICLOFENAC SODIUM 1 % EX GEL
2.0000 g | Freq: Four times a day (QID) | CUTANEOUS | 1 refills | Status: DC
  Filled 2021-11-27: qty 100, 7d supply, fill #0
  Filled 2022-04-14: qty 100, 12d supply, fill #1

## 2021-11-28 ENCOUNTER — Ambulatory Visit: Payer: Self-pay

## 2021-11-28 ENCOUNTER — Other Ambulatory Visit: Payer: Self-pay

## 2021-12-03 ENCOUNTER — Other Ambulatory Visit (HOSPITAL_COMMUNITY): Payer: Self-pay

## 2021-12-03 ENCOUNTER — Other Ambulatory Visit: Payer: Self-pay

## 2021-12-03 ENCOUNTER — Ambulatory Visit (INDEPENDENT_AMBULATORY_CARE_PROVIDER_SITE_OTHER): Payer: Medicaid Other

## 2021-12-03 DIAGNOSIS — I428 Other cardiomyopathies: Secondary | ICD-10-CM

## 2021-12-03 LAB — CUP PACEART REMOTE DEVICE CHECK
Battery Remaining Percentage: 84 %
Date Time Interrogation Session: 20230301093600
Implantable Lead Implant Date: 20160812
Implantable Lead Location: 753862
Implantable Lead Model: 3401
Implantable Pulse Generator Implant Date: 20210812
Pulse Gen Serial Number: 139514

## 2021-12-05 ENCOUNTER — Other Ambulatory Visit (HOSPITAL_COMMUNITY): Payer: Self-pay | Admitting: *Deleted

## 2021-12-05 DIAGNOSIS — Z7901 Long term (current) use of anticoagulants: Secondary | ICD-10-CM

## 2021-12-05 DIAGNOSIS — Z95811 Presence of heart assist device: Secondary | ICD-10-CM

## 2021-12-08 ENCOUNTER — Ambulatory Visit: Payer: Medicaid Other | Admitting: Nurse Practitioner

## 2021-12-08 ENCOUNTER — Other Ambulatory Visit: Payer: Self-pay

## 2021-12-08 ENCOUNTER — Encounter: Payer: Self-pay | Admitting: Nurse Practitioner

## 2021-12-08 VITALS — BP 118/81 | HR 97 | Temp 97.8°F | Ht 67.0 in | Wt 314.8 lb

## 2021-12-08 DIAGNOSIS — N183 Chronic kidney disease, stage 3 unspecified: Secondary | ICD-10-CM

## 2021-12-08 DIAGNOSIS — E1122 Type 2 diabetes mellitus with diabetic chronic kidney disease: Secondary | ICD-10-CM

## 2021-12-08 DIAGNOSIS — Z794 Long term (current) use of insulin: Secondary | ICD-10-CM | POA: Diagnosis not present

## 2021-12-08 LAB — POCT GLYCOSYLATED HEMOGLOBIN (HGB A1C)
HbA1c POC (<> result, manual entry): 5.2 % (ref 4.0–5.6)
HbA1c, POC (controlled diabetic range): 5.2 % (ref 0.0–7.0)
HbA1c, POC (prediabetic range): 5.2 % — AB (ref 5.7–6.4)
Hemoglobin A1C: 5.2 % (ref 4.0–5.6)

## 2021-12-08 NOTE — Patient Instructions (Signed)
You were seen today in the [facility] for reevaluation of diabetes. Labs were collected, results will be available via MyChart or, if abnormal, you will be contacted by clinic staff. Please follow up in 3 mths for reevaluation of diabetes. ?

## 2021-12-08 NOTE — Progress Notes (Signed)
Jeffrey [name], [place]  [address] Phone:  [phone]   Fax:  [phone] Subjective:   Patient ID: Jeffrey [name], male    DOB: [birth year], 46 y.o.   MRN: [id number]  Chief Complaint  Patient presents with   Follow-up    Pt is here today for his 3 month follow up visit and states that he has been depressed x 2 weeks. Pt states that his cardiologist increased his zoloft.   HPI Jeffrey [name] 46 y.o. male  has a past medical history of Anxiety, Aspirin allergy, Childhood asthma, Chronic systolic CHF (congestive heart failure) ([place]), CKD (chronic kidney disease) stage 2, GFR 60-89 ml/min, H/O vasectomy (12/2019), High cholesterol, Hypertension, Morbid obesity ([place]), Nephrolithiasis, OSA on CPAP, Paroxysmal atrial fibrillation ([place]), Presumed NICM, Renal cell carcinoma ([place]), Troponin level elevated, Type II diabetes mellitus ([place]), and Ventricular tachycardia. To the [facility] for reevaluation of DM.  Diabetes Mellitus: Patient presents for follow up of diabetes. Symptoms: foot ulcerations, hyperglycemia, increase appetite, and nausea. Symptoms have been well-controlled. Patient denies none.  Evaluation to date has been included: hemoglobin A1C.  Home sugars: BGs consistently in an acceptable range, BGs range between 60 and 95 . Treatment to date: no recent interventions.   Verbalizes having increased depression over the past 2 wks due to recent deaths in the family and was also informed by cardiologist that LVAD contributes to depression. Cardiologist recently increased dosage of Zoloft.   When questioned about increase in weight, patient states that he has been monitoring meals, but not exercising regularly. Patient also states that LVAD equipment adds 10 lbs to weight, when he weighed himself this morning her was 204 lbs.  Denies any other complaints today.  Denies any fever. Denies any fatigue, chest pain, shortness of breath, HA or dizziness. Denies any  blurred vision, numbness or tingling.    Past Medical History:  Diagnosis Date   Anxiety    Aspirin allergy    Childhood asthma    Chronic systolic CHF (congestive heart failure) ([place])    a. EF 20-25% in 2012. b. EF 45-50% in 10/2011 with nonischemic nuc - presumed NICM. c. 12/2014 Echo: Sev depressed LV fxn, sev dil LV, mild LVH, mild MR, sev dil LA, mildly reduced RV fxn.   CKD (chronic kidney disease) stage 2, GFR 60-89 ml/min    H/O vasectomy 12/2019   High cholesterol    Hypertension    Morbid obesity ([place])    Nephrolithiasis    OSA on CPAP    Paroxysmal atrial fibrillation ([place])    Presumed NICM    a. 04/2014 Myoview: EF 26%, glob HK, sev glob HK, ? prior infarct;  b. Never cathed 2/2 CKD.   Renal cell carcinoma ([place])    a. s/p Rt robotic assisted partial converted to radical nephrectomy on 01/2013.   Troponin level elevated    a. 04/2014, 12/2014: felt due to CHF.   Type II diabetes mellitus (HCC)    Ventricular tachycardia    a. appropriate ICD therapy 12/2017    Past Surgical History:  Procedure Laterality Date   APPENDECTOMY  07/2004   BIOPSY  02/10/2021   Procedure: BIOPSY;  Surgeon: Gatha Mayer, MD;  [facility]: [facility];  Service: Endoscopy;;   CARDIAC CATHETERIZATION N/A 05/17/2015   Procedure: Right/Left Heart Cath and Coronary Angiography;  Surgeon: Jolaine Artist, MD;  Location: [place] [facility];  Service: Cardiovascular;  Laterality: N/A;   CARDIOVERSION  N/A 04/30/2021   Procedure: CARDIOVERSION;  Surgeon: Larey Dresser, MD;  Location: [facility] ENDOSCOPY;  Service: Cardiovascular;  Laterality: N/A;   CARDIOVERSION N/A 07/27/2021   Procedure: CARDIOVERSION;  Surgeon: Jolaine Artist, MD;  Location: [place];  Service: Cardiovascular;  Laterality: N/A;   COLONOSCOPY WITH PROPOFOL N/A 02/10/2021   Procedure: COLONOSCOPY WITH PROPOFOL;  Surgeon: Gatha Mayer, MD;  [facility]: [facility];  Service: Endoscopy;  Laterality: N/A;   EP IMPLANTABLE DEVICE N/A  05/17/2015   Procedure: SubQ ICD Implant;  Surgeon: Will Meredith Leeds, MD;  Location: [place] [facility];  Service: Cardiovascular;  Laterality: N/A;   ESOPHAGOGASTRODUODENOSCOPY (EGD) WITH PROPOFOL N/A 02/10/2021   Procedure: ESOPHAGOGASTRODUODENOSCOPY (EGD) WITH PROPOFOL;  Surgeon: Gatha Mayer, MD;  [facility];  Service: Endoscopy;  Laterality: N/A;   INSERTION OF IMPLANTABLE LEFT VENTRICULAR ASSIST DEVICE N/A 07/21/2021   Procedure: INSERTION OF IMPLANTABLE LEFT VENTRICULAR ASSIST DEVICE;  Surgeon: Gaye Pollack, MD;  Location: [place];  Service: Open Heart Surgery;  Laterality: N/A;   RIGHT HEART CATH N/A 05/13/2021   Procedure: RIGHT HEART CATH;  Surgeon: Jolaine Artist, MD;  Location: [place] [facility];  Service: Cardiovascular;  Laterality: N/A;   RIGHT HEART CATH N/A 07/18/2021   Procedure: RIGHT HEART CATH;  Surgeon: Jolaine Artist, MD;  Location: [place] [facility];  Service: Cardiovascular;  Laterality: N/A;   ROBOTIC ASSISTED LAPAROSCOPIC LYSIS OF ADHESION  01/13/2013   Procedure: ROBOTIC ASSISTED LAPAROSCOPIC LYSIS OF ADHESION EXTENSIVE;  Surgeon: Alexis Frock, MD;  [facility];  Service: Urology;;   ROBOTIC ASSITED PARTIAL NEPHRECTOMY Right 01/13/2013   Procedure: ROBOTIC ASSITED PARTIAL NEPHRECTOMY CONVERTED TO ROBOTIC ASSISTED RIGHT RADICAL NEPHRECTOMY;  Surgeon: Alexis Frock, MD;  [facility]: [facility];  Service: Urology;  Laterality: Right;   SUBQ ICD CHANGEOUT N/A 05/16/2020   Procedure: SUBQ ICD CHANGEOUT;  Surgeon: [name], MD;  Location: [place] [facility];  Service: Cardiovascular;  Laterality: N/A;   SUBQ ICD CHANGEOUT N/A 05/15/2020   Procedure: SUBQ ICD CHANGEOUT;  Surgeon: [name], MD;  Location: [place] [facility];  Service: Cardiovascular;  Laterality: N/A;   TEE WITHOUT CARDIOVERSION N/A 04/30/2021   Procedure: TRANSESOPHAGEAL ECHOCARDIOGRAM (TEE);  Surgeon: Larey Dresser, MD;  Location: [facility] ENDOSCOPY;  Service:  Cardiovascular;  Laterality: N/A;   TEE WITHOUT CARDIOVERSION N/A 07/21/2021   Procedure: TRANSESOPHAGEAL ECHOCARDIOGRAM (TEE);  Surgeon: Gaye Pollack, MD;  Location: [place];  Service: Open Heart Surgery;  Laterality: N/A;   VASECTOMY      Family History  Problem Relation Age of Onset   Hypertension Mother    Diabetes Maternal Aunt    Heart attack Neg Hx    Stroke Neg Hx    Colon cancer Neg Hx    Pancreatic cancer Neg Hx    Stomach cancer Neg Hx    Liver cancer Neg Hx    Esophageal cancer Neg Hx     Social History   Socioeconomic History   Marital status: Married    Spouse name: Kalel Harty   Number of children: 31   Years of education: Not on file   Highest education level: Not on file  Occupational History   Not on file  Tobacco Use   Smoking status: Former    Packs/day: 0.25    Years: 22.00    Pack years: 5.50    Types: Cigarettes    Quit date: 04/30/2015    Years since quitting: 6.6  Smokeless tobacco: Never  Vaping Use   Vaping Use: Never used  Substance and Sexual Activity   Alcohol use: Not Currently    Comment: none   Drug use: Yes    Types: Marijuana   Sexual activity: Not Currently    Birth control/protection: Condom  Other Topics Concern   Not on file  Social History Narrative   Married - 6 kids 50 boys 7 girls many are adults, 2 young children with current wife   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   Social Determinants of Health   Financial Resource Strain: High Risk   Difficulty of Paying Living Expenses: Very hard  Food Insecurity: Landscape architect Present   Worried About Charity fundraiser in the Last Year: Sometimes true   Arboriculturist  in the Last Year: Sometimes true  Transportation Needs: Unmet Transportation Needs   Lack of Transportation (Medical): Yes   Lack of Transportation (Non-Medical): Yes  Physical Activity: Insufficiently Active   Days of Exercise per Week: 3 days   Minutes of Exercise per Session: 30 min  Stress: No Stress Concern Present   Feeling of Stress : Only a little  Social Connections: Moderately Integrated   Frequency of Communication with Friends and Family: More than three times a week   Frequency of Social Gatherings with Friends and Family: More than three times a week   Attends Religious Services: 1 to 4 times per year   Active Member of [employer] or Organizations: No   Attends Archivist Meetings: Never   Marital Status: Married  Human resources officer Violence: Not At Risk   Fear of Current or Ex-Partner: No   Emotionally Abused: No   Physically Abused: No   Sexually Abused: No    Outpatient Medications Prior to Visit  Medication Sig Dispense Refill   Accu-Chek FastClix Lancets MISC USE AS DIRECTED FOUR TIMES DAILY 102 each 11   acetaminophen (TYLENOL) 325 MG tablet Take 2 tablets (650 mg total) by mouth every 6 (six) hours as needed for fever.     allopurinol (ZYLOPRIM) 100 MG tablet Take 1 tablet (100 mg total) by mouth daily. 90 tablet 3   amiodarone (PACERONE) 200 MG tablet Take 1 tablet (200 mg total) by mouth daily. 30 tablet 6   colchicine 0.6 MG tablet Take 1 tablet (0.6 mg total) by mouth daily. 30 tablet 6   diclofenac Sodium (VOLTAREN) 1 % GEL Apply 2 g topically 4 (four) times daily. 100 g 1   enoxaparin (LOVENOX) 60 MG/0.6ML injection Inject 0.6 mLs (60 mg total) into the skin every 12 (twelve) hours. 6 mL 0   gabapentin (NEURONTIN) 300 MG capsule Take 1 capsule (300 mg total) by mouth 3 (three) times daily. 90 capsule 6   glucose blood test strip Use as directed 4 times daily. 100 each 11   insulin aspart protamine - aspart (NOVOLOG 70/30 MIX) (70-30) 100 UNIT/ML FlexPen  Inject 38 Units into the skin 2 (two) times daily. 15 mL 11   Insulin Pen Needle (TRUEPLUS 5-BEVEL PEN NEEDLES) 29G X 12.7MM MISC Use as directed in the morning, at noon, and at bedtime. 100 each 3  Insulin Syringe-Needle U-100 (TRUEPLUS INSULIN SYRINGE) 31G X 5/16" 0.5 ML MISC USE TO INJECT INSULIN TWICE DAILY. 100 each 11   isosorbide-hydrALAZINE (BIDIL) 20-37.5 MG tablet Take 2 tablets by mouth 3 (three) times daily. 180 tablet 6   metFORMIN (GLUCOPHAGE) 1000 MG tablet Take 1 tablet (1,000 mg total) by mouth 2 (two) times daily with a meal. 60 tablet 11   pantoprazole (PROTONIX) 40 MG tablet Take 1 tablet (40 mg total) by mouth daily. 30 tablet 11   sacubitril-valsartan (ENTRESTO) 49-51 MG Take 1 tablet by mouth 2 (two) times daily. 60 tablet 6   sertraline (ZOLOFT) 100 MG tablet Take 1.5 tablets (150 mg total) by mouth daily. 45 tablet 6   spironolactone (ALDACTONE) 25 MG tablet Take 1 tablet (25 mg total) by mouth daily. 30 tablet 6   traZODone (DESYREL) 100 MG tablet Take 1 tablet (100 mg total) by mouth at bedtime. 30 tablet 0   warfarin (COUMADIN) 4 MG tablet Take 8 mg (2 tablets) every Monday/Wednesday/Friday and 4 mg (1 tablet) all other days or as directed by HF clinic 180 tablet 3   docusate sodium (COLACE) 100 MG capsule Take 1 capsule (100 mg total) by mouth 2 (two) times daily. (Patient not taking: Reported on 09/09/2021) 10 capsule 0   meclizine (ANTIVERT) 25 MG tablet Take 1 tablet (25 mg total) by mouth 3 (three) times daily as needed for dizziness. (Patient not taking: Reported on 09/22/2021) 30 tablet 0   polyethylene glycol (MIRALAX / GLYCOLAX) 17 g packet Take 17 g by mouth daily. (Patient not taking: Reported on 09/09/2021) 14 each 0   traMADol (ULTRAM) 50 MG tablet Take 1-2 tablets (50-100 mg total) by mouth every 4 (four) hours as needed for moderate pain. (Patient not taking: Reported on 12/08/2021) 30 tablet 0   No facility-administered medications prior to visit.     Allergies  Allergen Reactions   Aspirin Shortness Of Breath, Itching and Rash     Burning sensation (Patient reports he tolerates other NSAIDS)    Bee Venom Hives and Swelling   Lisinopril Cough   Shrimp (Diagnostic) Rash   Tomato Rash    Review of Systems  Constitutional:  Negative for chills, fever and malaise/fatigue.  Respiratory:  Negative for cough and shortness of breath.   Cardiovascular:  Negative for chest pain, palpitations and leg swelling.  Gastrointestinal:  Negative for abdominal pain, blood in stool, constipation, diarrhea, nausea and vomiting.  Skin: Negative.   Neurological: Negative.   Psychiatric/Behavioral:  Positive for depression. The patient is not nervous/anxious.   All other systems reviewed and are negative.     Objective:    Physical Exam Vitals reviewed.  Constitutional:      General: He is not in acute distress.    Appearance: Normal appearance. He is obese.  HENT:     Head: Normocephalic.  Eyes:     General: No scleral icterus.       Right eye: No discharge.        Left eye: No discharge.     Extraocular Movements: Extraocular movements intact.     Conjunctiva/sclera: Conjunctivae normal.     Pupils: Pupils are equal, round, and reactive to light.  Cardiovascular:     Rate and Rhythm: Normal rate and regular rhythm.     Pulses: Normal pulses.     Heart sounds: Normal heart sounds.     Comments: No obvious peripheral edema Pulmonary:     Effort: Pulmonary effort is normal.  Breath sounds: Normal breath sounds.  Musculoskeletal:        General: No swelling, tenderness, deformity or signs of injury. Normal range of motion.     Right lower leg: No edema.     Left lower leg: No edema.  Skin:    General: Skin is warm and dry.     Capillary Refill: Capillary refill takes less than 2 seconds.  Neurological:     General: No focal deficit present.     Mental Status: He is alert and oriented to person, place, and time.  Psychiatric:         Mood and Affect: Mood normal.        Behavior: Behavior normal.        Thought Content: Thought content normal.        Judgment: Judgment normal.    BP 118/81    Pulse 97    Temp 97.8 F (36.6 C)    Ht '5\' 7"'$  (1.702 m)    Wt (!) 314 lb 12.8 oz (142.8 kg)    SpO2 99%    BMI 49.30 kg/m  Wt Readings from Last 3 Encounters:  12/08/21 (!) 314 lb 12.8 oz (142.8 kg)  11/24/21 (!) 315 lb 12.8 oz (143.2 kg)  10/27/21 292 lb (132.5 kg)    Immunization History  Administered Date(s) Administered   Influenza Split 10/24/2011   Influenza,inj,Quad PF,6+ Mos 07/23/2017, 09/06/2018, 09/18/2019   Pneumococcal Conjugate-13 11/01/2017   Pneumococcal Polysaccharide-23 10/24/2011   Tdap 04/07/2015    Diabetic Foot Exam - Simple   No data filed     Lab Results  Component Value Date   TSH 2.564 08/13/2021   Lab Results  Component Value Date   WBC 5.2 11/24/2021   HGB 13.5 11/24/2021   HCT 42.3 11/24/2021   MCV 82.9 11/24/2021   PLT 210 11/24/2021   Lab Results  Component Value Date   NA 138 11/24/2021   K 4.6 11/24/2021   CO2 23 11/24/2021   GLUCOSE 141 (H) 11/24/2021   BUN 32 (H) 11/24/2021   CREATININE 1.48 (H) 11/24/2021   BILITOT 0.6 10/23/2021   ALKPHOS 65 10/23/2021   AST 20 10/23/2021   ALT 21 10/23/2021   PROT 7.4 10/23/2021   ALBUMIN 3.9 10/23/2021   CALCIUM 9.7 11/24/2021   ANIONGAP 6 11/24/2021   GFR 58.42 (L) 01/22/2015   Lab Results  Component Value Date   CHOL 174 06/10/2021   CHOL 149 05/12/2021   CHOL 188 10/28/2020   Lab Results  Component Value Date   HDL 37 (L) 06/10/2021   HDL 31 (L) 05/12/2021   HDL 37 (L) 10/28/2020   Lab Results  Component Value Date   LDLCALC 115 (H) 06/10/2021   LDLCALC 94 05/12/2021   LDLCALC 107 (H) 10/28/2020   Lab Results  Component Value Date   TRIG 120 06/10/2021   TRIG 120 05/12/2021   TRIG 148 05/04/2021   Lab Results  Component Value Date   CHOLHDL 4.7 06/10/2021   CHOLHDL 4.8 05/12/2021   CHOLHDL  5.1 (H) 10/28/2020   Lab Results  Component Value Date   HGBA1C 5.2 12/08/2021   HGBA1C 5.2 12/08/2021   HGBA1C 5.2 (A) 12/08/2021   HGBA1C 5.2 12/08/2021       Assessment & Plan:   Problem List Items Addressed This Visit       Endocrine   Type 2 diabetes mellitus with stage 3 chronic kidney disease, with long-term current use of insulin ([place]) -  Primary (Chronic)   Relevant Orders   HgB A1c (Completed): 5.9, at goal, with no acute changes in treatment plan needed Encouraged continued diet and exercise efforts  Encouraged continued compliance with medication  Discussed diet and exercise options at length    Follow up in 3 mths for reevaluation of DM, weight and depression, sooner as needed    I am having Keontae L. Vicuna maintain his acetaminophen, docusate sodium, allopurinol, spironolactone, polyethylene glycol, metFORMIN, insulin aspart protamine - aspart, amiodarone, traMADol, sacubitril-valsartan, meclizine, colchicine, enoxaparin, Insulin Syringe-Needle U-100, TRUEplus 5-Bevel Pen Needles, Accu-Chek FastClix Lancets, glucose blood, gabapentin, pantoprazole, traZODone, sertraline, isosorbide-hydrALAZINE, warfarin, and diclofenac Sodium.  No orders of the defined types were placed in this encounter.    Teena Dunk, NP

## 2021-12-09 ENCOUNTER — Other Ambulatory Visit: Payer: Self-pay

## 2021-12-09 ENCOUNTER — Ambulatory Visit (HOSPITAL_COMMUNITY): Payer: Self-pay | Admitting: Pharmacist

## 2021-12-09 ENCOUNTER — Ambulatory Visit (HOSPITAL_COMMUNITY)
Admission: RE | Admit: 2021-12-09 | Discharge: 2021-12-09 | Disposition: A | Discharge status: Home / Self Care | Payer: Medicaid Other | Source: Ambulatory Visit | Attending: Cardiology | Admitting: Cardiology

## 2021-12-09 DIAGNOSIS — Z7901 Long term (current) use of anticoagulants: Secondary | ICD-10-CM | POA: Insufficient documentation

## 2021-12-09 DIAGNOSIS — Z95811 Presence of heart assist device: Secondary | ICD-10-CM | POA: Insufficient documentation

## 2021-12-09 LAB — PROTIME-INR
INR: 1.7 — ABNORMAL HIGH (ref 0.8–1.2)
Prothrombin Time: 20.2 seconds — ABNORMAL HIGH (ref 11.4–15.2)

## 2021-12-09 NOTE — Progress Notes (Signed)
LVAD INR 

## 2021-12-10 NOTE — Progress Notes (Signed)
Remote ICD transmission.   

## 2021-12-12 ENCOUNTER — Other Ambulatory Visit: Payer: Self-pay

## 2021-12-12 ENCOUNTER — Other Ambulatory Visit: Payer: Self-pay | Admitting: Obstetrics and Gynecology

## 2021-12-12 NOTE — Patient Instructions (Signed)
Hi Jeffrey [name] to catch up with you today-have a great rest of your day!! ? ?Jeffrey [name] was given information about Medicaid Managed Care team care coordination services as a part of their Healthy [facility] Medicaid benefit. Jeffrey [name] verbally consented to engagement with the [facility] Managed Care team.  ? ?If you are experiencing a medical emergency, please call 911 or report to your local emergency department or urgent care.  ? ?If you have a non-emergency medical problem during routine business hours, please contact your provider's office and ask to speak with a nurse.  ? ?For questions related to your Healthy [facility] health plan, please call: [phone] or visit the homepage here: [email or web address] ? ?If you would like to schedule transportation through your Healthy [facility] plan, please call the following number at least 2 days in advance of your appointment: [phone] ? For information about your ride after you set it up, call Ride Assist at [phone]. Use this number to activate a Will Call pickup, or if your transportation is late for a scheduled pickup. Use this number, too, if you need to make a change or cancel a previously scheduled reservation. ? If you need transportation services right away, call [phone]. The after-hours call center is staffed 24 hours to handle ride assistance and urgent reservation requests (including discharges) 365 days a year. Urgent trips include sick visits, hospital discharge requests and life-sustaining treatment. ? ?Call the [facility] at [phone], at any time, 24 hours a day, 7 days a week. If you are in danger or need immediate medical attention call 911. ? ?If you would like help to quit smoking, call 1-800-QUIT-NOW 252 795 6803) OR Espa?ol: 1-855-D?jelo-Ya 936-163-4041) o para m?s informaci?n haga clic aqu? or Text READY to 200-400 to register via text ? ?Jeffrey [name] - following are  the goals we discussed in your visit today:  ? Goals Addressed   ? ?  ?  ?  ?  ? This Visit's Progress  ?  Protect My Health     ?  Timeframe:  Long-Range Goal ?Priority:  High ?Start Date:    09/24/20                         ?Expected End Date:  ongoing          ?  ?- schedule recommended health tests  ?- schedule and keep appointment for annual check-up  ?12/12/21:  patient with recent PCP visit-12/08/21 ?  ? ?Patient verbalizes understanding of instructions and care plan provided today and agrees to view in [place]. Active MyChart status confirmed with patient.   ? ?The Managed Medicaid care management team will reach out to the patient again over the next 30 days.  ?The  Patient  has been provided with contact information for the Managed Medicaid care management team and has been advised to call with any health related questions or concerns.  ? ?Jeffrey Raider RN, BSN ?[facility] ?Care Management Coordinator - Managed Medicaid High Risk ?[phone] ?  ?Following is a copy of your plan of care:  ?Care Plan : General Plan of Care (Adult)  ?Updates made by Gayla Medicus, RN since 12/12/2021 12:00 AM  ?  ? ?Problem: Health Promotion or Disease Self-Management (General Plan of Care)   ?Priority: High  ?Onset Date: 09/24/2020  ?Note:   ?Current Barriers:  ?Chronic Disease Management support and education needs, S/P LVAD 07/21/21 ?12/12/21:  patient  still awaiting food stamp and disability determination-needs dental resources. Patient c/o depression today-Zoloft increased.  Patient to start HF and LVAD support group which meets 3rd Tuesday of each month.  Declines therapy services at this time. ? ?Nurse Case Manager Clinical Goal(s):  ?Over the next 90 days, patient will attend all scheduled medical appointments: ?Over the next 30 days, patient wil attend HF and LVAD support group ?Over the next 30 days, patient will receive resources ? ?Interventions:  ?Inter-disciplinary care team collaboration  (see longitudinal plan of care) ?Collaborated with BSW regarding food stamps and dental resources. ?BSW referral for food stamp updates and dental resources. ?Evaluation of current treatment plan and patient's adherence to plan as established by provider.  ?BSW contacted patient regarding daycare voucher and foodstamps. BSW informed patient he would have to contact DSS and have the child's name placed on the waitlist, patient can also apply for foodstamps at the same time. Patient reports no other resources needed at this time.  ?09/04/21: BSW spoke with patient, he stated he is not sure if they have been placed on the daycare voucher because his wife has not said anything to him yet. Patient stated they have applied for foodstamps but have not received them, he thinks its because his wife makes to much money. No other resources are needed at this time. ?10/22/21: Bsw completed a follow up telephone call with patient regarding dental resources and follow up with his foodstamps. Patient stated he would like for dental resources to be mailed to him. Patient asked BSW if she could provide an update on his foodstamp application, BSW informed patient he would have to contact DSS call center or his worker to get an update on his foodstamp application. No other resources are needed at this time. ?Reviewed medications with patient. ?Discussed plans with patient for ongoing care management follow up and provided patient with direct contact information for care management team ?Provided patient with educational materials related to exercise. ?Reviewed scheduled/upcoming provider appointments. ?Collaborated with Pharmacy ?Pharmacy referral for medication review ? ?Patient Goals/Self-Care Activities ?Over the next 90 days, patient will: ? -Attends all scheduled provider appointments ?Calls provider office for new concerns or questions ? ?Follow Up Plan: The Managed Medicaid care management team will reach out to the patient again  over the next 30 days.  ?The patient has been provided with contact information for the Managed Medicaid care management team and has been advised to call with any health related questions or concerns.   ? ?  ?

## 2021-12-12 NOTE — Patient Outreach (Signed)
Medicaid Managed Care   Nurse Care Manager Note  12/12/2021 Name:  Jeffrey [name] MRN:  [id number] DOB:  17-Jan-1976  Jeffrey [name] is an 46 y.o. year old male who is a primary patient of [place], Jake Church I, NP.  The [facility] Managed Care Coordination team was consulted for assistance with:    Chronic healthcare management needs, DM, HTN, HLD, OSA, CKD, CHF, depression, S/P LVAD , gout, h/o renal cell carcinoma  Mr. Douthit was given information about Medicaid Managed Care Coordination team services today. Jeffrey [name] Patient agreed to services and verbal consent obtained.  Engaged with patient by telephone for follow up visit in response to provider referral for case management and/or care coordination services.   Assessments/Interventions:  Review of past medical history, allergies, medications, health status, including review of consultants reports, laboratory and other test data, was performed as part of comprehensive evaluation and provision of chronic care management services.  SDOH (Social Determinants of Health) assessments and interventions performed: SDOH Interventions    Flowsheet Row Most Recent Value  SDOH Interventions   Financial Strain Interventions Other (Comment)  [referral to MM BSW]     Care Plan  Allergies  Allergen Reactions   Aspirin Shortness Of Breath, Itching and Rash     Burning sensation (Patient reports he tolerates other NSAIDS)    Bee Venom Hives and Swelling   Lisinopril Cough   Shrimp (Diagnostic) Rash   Tomato Rash    Medications Reviewed Today     Reviewed by Gayla Medicus, RN (Registered Nurse) on 12/12/21 at 1044  Med List Status: <None>   Medication Order Taking? Sig Documenting Provider Last Dose Status Informant  Accu-Chek FastClix Lancets MISC [id number] No USE AS DIRECTED FOUR TIMES DAILY Vevelyn Francois, NP Taking Active   acetaminophen (TYLENOL) 325 MG tablet [id number] No Take 2 tablets (650 mg total) by mouth every 6 (six) hours as needed  for fever. Darrick Grinder D, NP Taking Active Self  allopurinol (ZYLOPRIM) 100 MG tablet [id number] No Take 1 tablet (100 mg total) by mouth daily. Cathlyn Parsons, PA-C Taking Active Self  amiodarone (PACERONE) 200 MG tablet [id number] No Take 1 tablet (200 mg total) by mouth daily. Darrick Grinder D, NP Taking Active   colchicine 0.6 MG tablet [id number] No Take 1 tablet (0.6 mg total) by mouth daily. Darrick Grinder D, NP Taking Active   diclofenac Sodium (VOLTAREN) 1 % GEL [id number] No Apply 2 g topically 4 (four) times daily. Izora Ribas, MD Taking Active   docusate sodium (COLACE) 100 MG capsule [id number] No Take 1 capsule (100 mg total) by mouth 2 (two) times daily.  Patient not taking: Reported on 09/09/2021   Darrick Grinder D, NP Not Taking Active Self  enoxaparin (LOVENOX) 60 MG/0.6ML injection [id number] No Inject 0.6 mLs (60 mg total) into the skin every 12 (twelve) hours. Bensimhon, Shaune Pascal, MD Taking Active   gabapentin (NEURONTIN) 300 MG capsule [id number] No Take 1 capsule (300 mg total) by mouth 3 (three) times daily. Bensimhon, Shaune Pascal, MD Taking Active   glucose blood test strip [id number] No Use as directed 4 times daily. Bo Merino I, NP Taking Active   insulin aspart protamine - aspart (NOVOLOG 70/30 MIX) (70-30) 100 UNIT/ML FlexPen [id number] No Inject 38 Units into the skin 2 (two) times daily. [place], Maricela Bo, FNP Taking Active Self  Insulin Pen Needle (TRUEPLUS 5-BEVEL PEN NEEDLES) 29G X 12.7MM MISC [id number] No Use as directed in the morning,  at noon, and at bedtime. Bo Merino I, NP Taking Active   Insulin Syringe-Needle U-100 (TRUEPLUS INSULIN SYRINGE) 31G X 5/16" 0.5 ML MISC [id number] No USE TO INJECT INSULIN TWICE DAILY. Bo Merino I, NP Taking Active   isosorbide-hydrALAZINE (BIDIL) 20-37.5 MG tablet [id number] No Take 2 tablets by mouth 3 (three) times daily. Bensimhon, Shaune Pascal, MD Taking Active   meclizine (ANTIVERT) 25 MG tablet [id number] No Take 1 tablet (25  mg total) by mouth 3 (three) times daily as needed for dizziness.  Patient not taking: Reported on 09/22/2021   Darrick Grinder D, NP Not Taking Active   metFORMIN (GLUCOPHAGE) 1000 MG tablet [id number] No Take 1 tablet (1,000 mg total) by mouth 2 (two) times daily with a meal. [place], Maricela Bo, FNP Taking Active Self  pantoprazole (PROTONIX) 40 MG tablet [id number] No Take 1 tablet (40 mg total) by mouth daily. Bensimhon, Shaune Pascal, MD Taking Active   polyethylene glycol (MIRALAX / GLYCOLAX) 17 g packet [id number] No Take 17 g by mouth daily.  Patient not taking: Reported on 09/09/2021   Cathlyn Parsons, PA-C Not Taking Active Self  sacubitril-valsartan (ENTRESTO) 49-51 MG [id number] No Take 1 tablet by mouth 2 (two) times daily. Clegg, Amy D, NP Taking Active   sertraline (ZOLOFT) 100 MG tablet [id number] No Take 1.5 tablets (150 mg total) by mouth daily. Bensimhon, Shaune Pascal, MD Taking Active   spironolactone (ALDACTONE) 25 MG tablet [id number] No Take 1 tablet (25 mg total) by mouth daily. Cathlyn Parsons, PA-C Taking Active Self  traMADol (ULTRAM) 50 MG tablet [id number] No Take 1-2 tablets (50-100 mg total) by mouth every 4 (four) hours as needed for moderate pain.  Patient not taking: Reported on 12/08/2021   Darrick Grinder D, NP Not Taking Active   traZODone (DESYREL) 100 MG tablet [id number] No Take 1 tablet (100 mg total) by mouth at bedtime. Bo Merino I, NP Taking Active   warfarin (COUMADIN) 4 MG tablet [id number] No Take 8 mg (2 tablets) every Monday/Wednesday/Friday and 4 mg (1 tablet) all other days or as directed by HF clinic Bensimhon, Shaune Pascal, MD Taking Active            Patient Active Problem List   Diagnosis Date Noted   Presence of left ventricular assist device (LVAD) ([place]) 07/25/2021   Acute on chronic systolic CHF (congestive heart failure) ([place]) 07/18/2021   Cardiogenic shock (HCC)    Atrial fibrillation with RVR ([place]) 04/30/2021   Acute hypoxemic respiratory failure  (HCC)    Right wrist pain 04/16/2021   Absolute anemia    Rectal bleeding    Iron deficiency 12/25/2020   Long term current use of anticoagulant - apixaban 12/25/2020   Paroxysmal atrial fibrillation (HCC)    Lactic acidosis 11/22/2020   Hypotension 11/22/2020   Aspiration into airway    ICD (implantable cardioverter-defibrillator) battery depletion 05/15/2020   Endotracheal tube present    Respiratory failure ([place])    Acute encephalopathy    Acute pulmonary edema (HCC)    NICM (nonischemic cardiomyopathy) ([place]) 04/24/2020   S/P vasectomy 12/20/2019   Anxiety 12/20/2019   Hemoglobin A1C between 7% and 9% indicating borderline diabetic control ([place]) 12/20/2019   Seasonal allergies 12/20/2019   Insomnia 12/20/2019   Vasectomy evaluation 06/20/2019   Visual problems 06/20/2019   Blurry vision, bilateral 06/20/2019   Hyperglycemia 02/13/2019   Hyperosmolar (nonketotic) coma ([place]) 01/19/2019   AICD (automatic cardioverter/defibrillator) present 01/19/2019   Hyperlipidemia 12/07/2018   Follow-up exam  11/22/2018   Class 3 severe obesity due to excess calories with serious comorbidity and body mass index (BMI) of 45.0 to 49.9 in adult [facility]) 11/22/2018   Dizziness 11/22/2018   Lingular pneumonia 08/04/2018   Ventricular tachycardia 12/30/2017   PAF (paroxysmal atrial fibrillation) (HCC)    Dilated cardiomyopathy ([place])    Pollen allergy 02/17/2017   Dyslipidemia 02/17/2017   Hematochezia 11/08/2015   CKD (chronic kidney disease), stage III ([place]) 06/20/2015   Cardiac defibrillator in situ    Chronic systolic CHF (congestive heart failure) ([place]) 03/11/2015   Gouty arthritis 03/11/2015   Chest pain 12/28/2014   Acute renal failure superimposed on stage 3 chronic kidney disease ([place]) 04/08/2014   Aspirin allergy 04/08/2014   Renal cell carcinoma ([place])    Gout attack 11/10/2011   Morbid obesity ([place]) 10/23/2011   OSA (obstructive sleep apnea) 10/23/2011   Type 2 diabetes mellitus  with stage 3 chronic kidney disease, with long-term current use of insulin ([place]) 10/22/2011   Hyperlipidemia associated with type 2 diabetes mellitus ([place]) 12/25/2010   Hypertension associated with diabetes ([place]) 12/25/2010   Conditions to be addressed/monitored per PCP order:  Chronic healthcare management needs, DM, HTN, HLD, OSA, CKD, CHF, depression, S/P LVAD  Care Plan : General Plan of Care (Adult)  Updates made by Gayla Medicus, RN since 12/12/2021 12:00 AM     Problem: Health Promotion or Disease Self-Management (General Plan of Care)   Priority: High  Onset Date: 09/24/2020  Note:   Current Barriers:  Chronic Disease Management support and education needs, S/P LVAD 07/21/21 12/12/21:  patient still awaiting food stamp and disability determination-needs dental resources. Patient c/o depression today-Zoloft increased.  Patient to start HF and LVAD support group which meets 3rd Tuesday of each month.  Declines therapy services at this time.  Nurse Case Manager Clinical Goal(s):  Over the next 90 days, patient will attend all scheduled medical appointments: Over the next 30 days, patient wil attend HF and LVAD support group Over the next 30 days, patient will receive resources  Interventions:  Inter-disciplinary care team collaboration (see longitudinal plan of care) Collaborated with BSW regarding food stamps and dental resources. BSW referral for food stamp updates and dental resources. Evaluation of current treatment plan and patient's adherence to plan as established by provider.  BSW contacted patient regarding daycare voucher and foodstamps. BSW informed patient he would have to contact DSS and have the child's name placed on the waitlist, patient can also apply for foodstamps at the same time. Patient reports no other resources needed at this time.  09/04/21: BSW spoke with patient, he stated he is not sure if they have been placed on the daycare voucher because his wife has  not said anything to him yet. Patient stated they have applied for foodstamps but have not received them, he thinks its because his wife makes to much money. No other resources are needed at this time. 10/22/21: Bsw completed a follow up telephone call with patient regarding dental resources and follow up with his foodstamps. Patient stated he would like for dental resources to be mailed to him. Patient asked BSW if she could provide an update on his foodstamp application, BSW informed patient he would have to contact DSS call center or his worker to get an update on his foodstamp application. No other resources are needed at this time. Reviewed medications with patient. Discussed plans with patient for ongoing care management follow up and provided patient with direct contact  information for care management team Provided patient with educational materials related to exercise. Reviewed scheduled/upcoming provider appointments. Collaborated with Pharmacy Pharmacy referral for medication review  Patient Goals/Self-Care Activities Over the next 90 days, patient will:  -Attends all scheduled provider appointments Calls provider office for new concerns or questions  Follow Up Plan: The Managed Medicaid care management team will reach out to the patient again over the next 30 days.  The patient has been provided with contact information for the Managed Medicaid care management team and has been advised to call with any health related questions or concerns.    Follow Up:  Patient agrees to Care Plan and Follow-up.  Plan: The Managed Medicaid care management team will reach out to the patient again over the next 30 days. and The  Patient has been provided with contact information for the Managed Medicaid care management team and has been advised to call with any health related questions or concerns.  Date/time of next scheduled RN care management/care coordination outreach:  01/12/22 at 0900.

## 2021-12-15 ENCOUNTER — Other Ambulatory Visit: Payer: Self-pay

## 2021-12-15 ENCOUNTER — Other Ambulatory Visit: Payer: Self-pay | Admitting: Nurse Practitioner

## 2021-12-15 ENCOUNTER — Telehealth (HOSPITAL_COMMUNITY): Payer: Self-pay

## 2021-12-15 NOTE — Patient Instructions (Signed)
Visit Information ? ?Jeffrey [name] was given information about Medicaid Managed Care team care coordination services as a part of their Healthy [facility] Medicaid benefit. Jeffrey [name] verbally consented to engagement with the [facility] Managed Care team.  ? ?If you are experiencing a medical emergency, please call 911 or report to your local emergency department or urgent care.  ? ?If you have a non-emergency medical problem during routine business hours, please contact your provider's office and ask to speak with a nurse.  ? ?For questions related to your Healthy [facility] health plan, please call: [phone] or visit the homepage here: [email or web address] ? ?If you would like to schedule transportation through your Healthy [facility] plan, please call the following number at least 2 days in advance of your appointment: [phone] ? For information about your ride after you set it up, call Ride Assist at [phone]. Use this number to activate a Will Call pickup, or if your transportation is late for a scheduled pickup. Use this number, too, if you need to make a change or cancel a previously scheduled reservation. ? If you need transportation services right away, call [phone]. The after-hours call center is staffed 24 hours to handle ride assistance and urgent reservation requests (including discharges) 365 days a year. Urgent trips include sick visits, hospital discharge requests and life-sustaining treatment. ? ?Call the [place] at [phone], at any time, 24 hours a day, 7 days a week. If you are in danger or need immediate medical attention call 911. ? ?If you would like help to quit smoking, call 1-800-QUIT-NOW (718)834-7738) OR Espa?ol: 1-855-D?jelo-Ya 331-128-2451) o para m?s informaci?n haga clic aqu? or Text READY to 200-400 to register via text ? ?Jeffrey [name] - following are the goals we discussed in your visit today:  ? Goals  Addressed   ?None ?  ? ? ? ?Social Worker will follow up in 14 days .  ? ?[name], BSW,  ?[place]  ?High Risk Managed Medicaid Team  ?[phone]  ? ?Following is a copy of your plan of care:  ?Care Plan : General Plan of Care (Adult)  ?Updates made by Ethelda Chick since 12/15/2021 12:00 AM  ?  ? ?Problem: Health Promotion or Disease Self-Management (General Plan of Care)   ?Priority: High  ?Onset Date: 09/24/2020  ?Note:   ?Current Barriers:  ?Chronic Disease Management support and education needs, S/P LVAD 07/21/21 ?12/12/21:  patient still awaiting food stamp and disability determination-needs dental resources. Patient c/o depression today-Zoloft increased.  Patient to start HF and LVAD support group which meets 3rd Tuesday of each month.  Declines therapy services at this time. ? ?Nurse Case Manager Clinical Goal(s):  ?Over the next 90 days, patient will attend all scheduled medical appointments: ?Over the next 30 days, patient wil attend HF and LVAD support group ?Over the next 30 days, patient will receive resources ? ?Interventions:  ?Inter-disciplinary care team collaboration (see longitudinal plan of care) ?Collaborated with BSW regarding food stamps and dental resources. ?BSW referral for food stamp updates and dental resources. ?Evaluation of current treatment plan and patient's adherence to plan as established by provider.  ?BSW contacted patient regarding daycare voucher and foodstamps. BSW informed patient he would have to contact DSS and have the child's name placed on the waitlist, patient can also apply for foodstamps at the same time. Patient reports no other resources needed at this time.  ?09/04/21: BSW spoke with patient, he stated  he is not sure if they have been placed on the daycare voucher because his wife has not said anything to him yet. Patient stated they have applied for foodstamps but have not received them, he thinks its because his wife  makes to much money. No other resources are needed at this time. ?10/22/21: Bsw completed a follow up telephone call with patient regarding dental resources and follow up with his foodstamps. Patient stated he would like for dental resources to be mailed to him. Patient asked BSW if she could provide an update on his foodstamp application, BSW informed patient he would have to contact DSS call center or his worker to get an update on his foodstamp application. No other resources are needed at this time. ?12/15/21: BSW completed telephone outreach with patient. He states he did not receieve the resources for dental. BSW will resend letter. BSW informed patient it may benefit him to going up to DSS to inquire about his foodstamp application. Sometimes it is easier to go up there. Patient stated he was going to try the group session for now and is not interested in therapy, BSW did provide him with the information for [facility].No other resources are needed at this time. ?Reviewed medications with patient. ?Discussed plans with patient for ongoing care management follow up and provided patient with direct contact information for care management team ?Provided patient with educational materials related to exercise. ?Reviewed scheduled/upcoming provider appointments. ?Collaborated with Pharmacy ?Pharmacy referral for medication review ? ?Patient Goals/Self-Care Activities ?Over the next 90 days, patient will: ? -Attends all scheduled provider appointments ?Calls provider office for new concerns or questions ? ?Follow Up Plan: The Managed Medicaid care management team will reach out to the patient again over the next 30 days.  ?The patient has been provided with contact information for the Managed Medicaid care management team and has been advised to call with any health related questions or concerns.  ? ?  ?  ? ?  ?

## 2021-12-15 NOTE — Telephone Encounter (Signed)
No response from pt.  Closed referral  

## 2021-12-15 NOTE — Patient Outreach (Signed)
Medicaid Managed Care Social Work Note  12/15/2021 Name:  Jeffrey [name] MRN:  [id number] DOB:  30-Dec-1975  Jeffrey [name] is an 46 y.o. year old male who is a primary patient of [place], Jake Church I, NP.  The Medicaid Managed Care Coordination team was consulted for assistance with:  Community Resources   [name] was given information about Medicaid Managed Care Coordination team services today. Jeffrey [name] Patient agreed to services and verbal consent obtained.  Engaged with patient  for by telephone forfollow up visit in response to referral for case management and/or care coordination services.   Assessments/Interventions:  Review of past medical history, allergies, medications, health status, including review of consultants reports, laboratory and other test data, was performed as part of comprehensive evaluation and provision of chronic care management services.  SDOH: (Social Determinant of Health) assessments and interventions performed: BSW completed telephone outreach with patient. He states he did not receieve the resources for dental. BSW will resend letter. BSW informed patient it may benefit him to going up to DSS to inquire about his foodstamp application. Sometimes it is easier to go up there. Patient stated he was going to try the group session for now and is not interested in therapy, BSW did provide him with the information for [facility].No other resources are needed at this time.  Advanced Directives Status:  Not addressed in this encounter.  Care Plan                 Allergies  Allergen Reactions   Aspirin Shortness Of Breath, Itching and Rash     Burning sensation (Patient reports he tolerates other NSAIDS)    Bee Venom Hives and Swelling   Lisinopril Cough   Shrimp (Diagnostic) Rash   Tomato Rash    Medications Reviewed Today     Reviewed by Jeffrey Medicus, RN (Registered Nurse) on 12/12/21 at 1044  Med List Status: <None>   Medication Order Taking? Sig Documenting  Provider Last Dose Status Informant  Accu-Chek FastClix Lancets MISC [id number] No USE AS DIRECTED FOUR TIMES DAILY Vevelyn Francois, NP Taking Active   acetaminophen (TYLENOL) 325 MG tablet [id number] No Take 2 tablets (650 mg total) by mouth every 6 (six) hours as needed for fever. Darrick Grinder D, NP Taking Active Self  allopurinol (ZYLOPRIM) 100 MG tablet [id number] No Take 1 tablet (100 mg total) by mouth daily. Cathlyn Parsons, PA-C Taking Active Self  amiodarone (PACERONE) 200 MG tablet [id number] No Take 1 tablet (200 mg total) by mouth daily. Darrick Grinder D, NP Taking Active   colchicine 0.6 MG tablet [id number] No Take 1 tablet (0.6 mg total) by mouth daily. Darrick Grinder D, NP Taking Active   diclofenac Sodium (VOLTAREN) 1 % GEL [id number] No Apply 2 g topically 4 (four) times daily. Izora Ribas, MD Taking Active   docusate sodium (COLACE) 100 MG capsule [id number] No Take 1 capsule (100 mg total) by mouth 2 (two) times daily.  Patient not taking: Reported on 09/09/2021   Darrick Grinder D, NP Not Taking Active Self  enoxaparin (LOVENOX) 60 MG/0.6ML injection [id number] No Inject 0.6 mLs (60 mg total) into the skin every 12 (twelve) hours. Bensimhon, Shaune Pascal, MD Taking Active   gabapentin (NEURONTIN) 300 MG capsule [id number] No Take 1 capsule (300 mg total) by mouth 3 (three) times daily. Bensimhon, Shaune Pascal, MD Taking Active   glucose blood test strip [id number] No Use as directed 4 times daily. Bo Merino I,  NP Taking Active   insulin aspart protamine - aspart (NOVOLOG 70/30 MIX) (70-30) 100 UNIT/ML FlexPen [id number] No Inject 38 Units into the skin 2 (two) times daily. Fleming-Neon, Maricela Bo, FNP Taking Active Self  Insulin Pen Needle (TRUEPLUS 5-BEVEL PEN NEEDLES) 29G X 12.7MM MISC [id number] No Use as directed in the morning, at noon, and at bedtime. Bo Merino I, NP Taking Active   Insulin Syringe-Needle U-100 (TRUEPLUS INSULIN SYRINGE) 31G X 5/16" 0.5 ML MISC [id number] No USE TO INJECT  INSULIN TWICE DAILY. Bo Merino I, NP Taking Active   isosorbide-hydrALAZINE (BIDIL) 20-37.5 MG tablet [id number] No Take 2 tablets by mouth 3 (three) times daily. Bensimhon, Shaune Pascal, MD Taking Active   meclizine (ANTIVERT) 25 MG tablet [id number] No Take 1 tablet (25 mg total) by mouth 3 (three) times daily as needed for dizziness.  Patient not taking: Reported on 09/22/2021   Darrick Grinder D, NP Not Taking Active   metFORMIN (GLUCOPHAGE) 1000 MG tablet [id number] No Take 1 tablet (1,000 mg total) by mouth 2 (two) times daily with a meal. Milford, Maricela Bo, FNP Taking Active Self  pantoprazole (PROTONIX) 40 MG tablet [id number] No Take 1 tablet (40 mg total) by mouth daily. Bensimhon, Shaune Pascal, MD Taking Active   polyethylene glycol (MIRALAX / GLYCOLAX) 17 g packet [id number] No Take 17 g by mouth daily.  Patient not taking: Reported on 09/09/2021   Cathlyn Parsons, PA-C Not Taking Active Self  sacubitril-valsartan (ENTRESTO) 49-51 MG [id number] No Take 1 tablet by mouth 2 (two) times daily. Clegg, Amy D, NP Taking Active   sertraline (ZOLOFT) 100 MG tablet [id number] No Take 1.5 tablets (150 mg total) by mouth daily. Bensimhon, Shaune Pascal, MD Taking Active   spironolactone (ALDACTONE) 25 MG tablet [id number] No Take 1 tablet (25 mg total) by mouth daily. Cathlyn Parsons, PA-C Taking Active Self  traMADol (ULTRAM) 50 MG tablet [id number] No Take 1-2 tablets (50-100 mg total) by mouth every 4 (four) hours as needed for moderate pain.  Patient not taking: Reported on 12/08/2021   Darrick Grinder D, NP Not Taking Active   traZODone (DESYREL) 100 MG tablet [id number] No Take 1 tablet (100 mg total) by mouth at bedtime. Bo Merino I, NP Taking Active   warfarin (COUMADIN) 4 MG tablet [id number] No Take 8 mg (2 tablets) every Monday/Wednesday/Friday and 4 mg (1 tablet) all other days or as directed by HF clinic Bensimhon, Shaune Pascal, MD Taking Active             Patient Active Problem List   Diagnosis  Date Noted   Presence of left ventricular assist device (LVAD) ([place]) 07/25/2021   Acute on chronic systolic CHF (congestive heart failure) ([place]) 07/18/2021   Cardiogenic shock (HCC)    Atrial fibrillation with RVR ([place]) 04/30/2021   Acute hypoxemic respiratory failure (HCC)    Right wrist pain 04/16/2021   Absolute anemia    Rectal bleeding    Iron deficiency 12/25/2020   Long term current use of anticoagulant - apixaban 12/25/2020   Paroxysmal atrial fibrillation (HCC)    Lactic acidosis 11/22/2020   Hypotension 11/22/2020   Aspiration into airway    ICD (implantable cardioverter-defibrillator) battery depletion 05/15/2020   Endotracheal tube present    Respiratory failure ([place])    Acute encephalopathy    Acute pulmonary edema (HCC)    NICM (nonischemic cardiomyopathy) ([place]) 04/24/2020   S/P vasectomy 12/20/2019   Anxiety 12/20/2019   Hemoglobin A1C between 7% and 9%  indicating borderline diabetic control ([place]) 12/20/2019   Seasonal allergies 12/20/2019   Insomnia 12/20/2019   Vasectomy evaluation 06/20/2019   Visual problems 06/20/2019   Blurry vision, bilateral 06/20/2019   Hyperglycemia 02/13/2019   Hyperosmolar (nonketotic) coma ([place]) 01/19/2019   AICD (automatic cardioverter/defibrillator) present 01/19/2019   Hyperlipidemia 12/07/2018   Follow-up exam 11/22/2018   Class 3 severe obesity due to excess calories with serious comorbidity and body mass index (BMI) of 45.0 to 49.9 in adult ([place]) 11/22/2018   Dizziness 11/22/2018   Lingular pneumonia 08/04/2018   Ventricular tachycardia 12/30/2017   PAF (paroxysmal atrial fibrillation) ([place])    Dilated cardiomyopathy ([place])    Pollen allergy 02/17/2017   Dyslipidemia 02/17/2017   Hematochezia 11/08/2015   CKD (chronic kidney disease), stage III ([place]) 06/20/2015   Cardiac defibrillator in situ    Chronic systolic CHF (congestive heart failure) ([place]) 03/11/2015   Gouty arthritis 03/11/2015   Chest pain 12/28/2014   Acute  renal failure superimposed on stage 3 chronic kidney disease ([place]) 04/08/2014   Aspirin allergy 04/08/2014   Renal cell carcinoma ([place])    Gout attack 11/10/2011   Morbid obesity ([place]) 10/23/2011   OSA (obstructive sleep apnea) 10/23/2011   Type 2 diabetes mellitus with stage 3 chronic kidney disease, with long-term current use of insulin ([place]) 10/22/2011   Hyperlipidemia associated with type 2 diabetes mellitus ([place]) 12/25/2010   Hypertension associated with diabetes ([place]) 12/25/2010    Conditions to be addressed/monitored per PCP order:   dental resources  Care Plan : General Plan of Care (Adult)  Updates made by Ethelda Chick since 12/15/2021 12:00 AM     Problem: Health Promotion or Disease Self-Management (General Plan of Care)   Priority: High  Onset Date: 09/24/2020  Note:   Current Barriers:  Chronic Disease Management support and education needs, S/P LVAD 07/21/21 12/12/21:  patient still awaiting food stamp and disability determination-needs dental resources. Patient c/o depression today-Zoloft increased.  Patient to start HF and LVAD support group which meets 3rd Tuesday of each month.  Declines therapy services at this time.  Nurse Case Manager Clinical Goal(s):  Over the next 90 days, patient will attend all scheduled medical appointments: Over the next 30 days, patient wil attend HF and LVAD support group Over the next 30 days, patient will receive resources  Interventions:  Inter-disciplinary care team collaboration (see longitudinal plan of care) Collaborated with BSW regarding food stamps and dental resources. BSW referral for food stamp updates and dental resources. Evaluation of current treatment plan and patient's adherence to plan as established by provider.  BSW contacted patient regarding daycare voucher and foodstamps. BSW informed patient he would have to contact DSS and have the child's name placed on the waitlist, patient can also apply for foodstamps  at the same time. Patient reports no other resources needed at this time.  09/04/21: BSW spoke with patient, he stated he is not sure if they have been placed on the daycare voucher because his wife has not said anything to him yet. Patient stated they have applied for foodstamps but have not received them, he thinks its because his wife makes to much money. No other resources are needed at this time. 10/22/21: Bsw completed a follow up telephone call with patient regarding dental resources and follow up with his foodstamps. Patient stated he would like for dental resources to be mailed to him. Patient asked BSW if she could provide an update on his foodstamp application, BSW informed patient  he would have to contact DSS call center or his worker to get an update on his foodstamp application. No other resources are needed at this time. 12/15/21: BSW completed telephone outreach with patient. He states he did not receieve the resources for dental. BSW will resend letter. BSW informed patient it may benefit him to going up to DSS to inquire about his foodstamp application. Sometimes it is easier to go up there. Patient stated he was going to try the group session for now and is not interested in therapy, BSW did provide him with the information for [facility].No other resources are needed at this time. Reviewed medications with patient. Discussed plans with patient for ongoing care management follow up and provided patient with direct contact information for care management team Provided patient with educational materials related to exercise. Reviewed scheduled/upcoming provider appointments. Collaborated with Pharmacy Pharmacy referral for medication review  Patient Goals/Self-Care Activities Over the next 90 days, patient will:  -Attends all scheduled provider appointments Calls provider office for new concerns or questions  Follow Up Plan: The Managed Medicaid care management team will reach out to the  patient again over the next 30 days.  The patient has been provided with contact information for the Managed Medicaid care management team and has been advised to call with any health related questions or concerns.         Follow up:  Patient agrees to Care Plan and Follow-up.  Plan: The Managed Medicaid care management team will reach out to the patient again over the next 14 days.  Date/time of next scheduled Social Work care management/care coordination outreach:  01/02/22  [name], Arita Miss, [place] Medicaid Team  [phone]

## 2021-12-16 ENCOUNTER — Other Ambulatory Visit: Payer: Self-pay

## 2021-12-16 MED ORDER — TRAZODONE HCL 100 MG PO TABS
100.0000 mg | ORAL_TABLET | Freq: Every day | ORAL | 0 refills | Status: DC
  Filled 2021-12-16: qty 30, 30d supply, fill #0

## 2021-12-18 ENCOUNTER — Other Ambulatory Visit (HOSPITAL_COMMUNITY): Payer: Self-pay | Admitting: *Deleted

## 2021-12-19 ENCOUNTER — Other Ambulatory Visit: Payer: Self-pay

## 2021-12-19 ENCOUNTER — Telehealth (HOSPITAL_COMMUNITY): Payer: Self-pay | Admitting: *Deleted

## 2021-12-19 ENCOUNTER — Ambulatory Visit (HOSPITAL_COMMUNITY)
Admission: RE | Admit: 2021-12-19 | Discharge: 2021-12-19 | Disposition: A | Discharge status: Home / Self Care | Payer: Medicaid Other | Source: Ambulatory Visit | Attending: Cardiology | Admitting: Cardiology

## 2021-12-19 DIAGNOSIS — Z4801 Encounter for change or removal of surgical wound dressing: Secondary | ICD-10-CM | POA: Insufficient documentation

## 2021-12-19 DIAGNOSIS — T827XXA Infection and inflammatory reaction due to other cardiac and vascular devices, implants and grafts, initial encounter: Secondary | ICD-10-CM | POA: Insufficient documentation

## 2021-12-19 DIAGNOSIS — Y839 Surgical procedure, unspecified as the cause of abnormal reaction of the patient, or of later complication, without mention of misadventure at the time of the procedure: Secondary | ICD-10-CM | POA: Insufficient documentation

## 2021-12-19 MED ORDER — DOXYCYCLINE HYCLATE 100 MG PO CAPS
100.0000 mg | ORAL_CAPSULE | Freq: Two times a day (BID) | ORAL | 0 refills | Status: DC
  Filled 2021-12-19: qty 14, 7d supply, fill #0

## 2021-12-19 NOTE — Progress Notes (Addendum)
Patient presented to [facility] for drive line exit site check. ? ?Pt says he "caught drive line on door yesterday" and it started draining more. He says it had no foul odor, but was enough their was drainage noted on his bed sheet when he woke up. Says his daughter changed last night and this morning when he called me. Reviewed with Darrick Grinder, NP who will start Doxy 100 mg twice daily for 7 days. Rx sent. Audry Riles, PharmD updated.  ? ?Exit site care:   Existing Sorbaview VAD dressing removed and site care performed using sterile technique. Drive line exit site cleaned with Chlora prep applicators x 2, allowed to dry, and gauze dressing with silver strip applied. Exit site healed and incorporated, the velour is fully implanted at exit site. No redness, tenderness, drainage, foul odor or rash noted. No tunneling, unable to express drainage for culture. Changed to daily dressing changes using silver strip around exit site; provided with 6 daily kits and 6 anchors. C/O irritation with Centurion foley anchor; placed alternative achor to try. Pt denies fever or chills.  ?  ?Patient Instructions: ?Switch to daily dressing kits (provided) and change daily if necessary to keep dressing clean and dry. Use silver strips around exit site (provided). ?Start Doxycycline 100 mg twice daily for 7 days. Rx sent to requested pharmacy. ?Return to scheduled VAD Clinic visit Monday, 12/22/21. ?Call VAD pager if any issues prior to clinic visit Monday.  ? ?Zada Girt RN, VAD Coordinator ?[phone] ? ?

## 2021-12-19 NOTE — Telephone Encounter (Signed)
Patient called [facility] Clinic this am to report increased drainage from drive line site as reported to [facility]) yesterday. Instructed patient to come to clinic today for drive line wound check. Pt verbalized understanding; appointment scheduled. ? ?Zada Girt RN, VAD Coordinator ?[phone] ?

## 2021-12-19 NOTE — Patient Instructions (Signed)
Switch to daily dressing kits (provided) and change daily if necessary to keep dressing clean and dry. Use silver strips around exit site (provided). ?Start Doxycycline 100 mg twice daily for 7 days. Rx sent to requested pharmacy. ?Return to scheduled VAD Clinic visit Monday, 12/22/21. ?Call VAD pager if any issues prior to clinic visit Monday.  ?

## 2021-12-21 ENCOUNTER — Telehealth (HOSPITAL_COMMUNITY): Payer: Self-pay | Admitting: *Deleted

## 2021-12-22 ENCOUNTER — Ambulatory Visit (HOSPITAL_COMMUNITY): Payer: Self-pay | Admitting: Pharmacist

## 2021-12-22 ENCOUNTER — Other Ambulatory Visit (HOSPITAL_COMMUNITY): Payer: Self-pay | Admitting: *Deleted

## 2021-12-22 ENCOUNTER — Encounter (HOSPITAL_COMMUNITY): Payer: Self-pay

## 2021-12-22 ENCOUNTER — Other Ambulatory Visit: Payer: Self-pay

## 2021-12-22 ENCOUNTER — Ambulatory Visit (HOSPITAL_COMMUNITY)
Admission: RE | Admit: 2021-12-22 | Discharge: 2021-12-22 | Disposition: A | Discharge status: Home / Self Care | Payer: Medicaid Other | Source: Ambulatory Visit | Attending: Internal Medicine | Admitting: Internal Medicine

## 2021-12-22 VITALS — BP 140/0 | HR 72 | Temp 98.7°F | Ht 67.0 in | Wt 313.8 lb

## 2021-12-22 DIAGNOSIS — Z4801 Encounter for change or removal of surgical wound dressing: Secondary | ICD-10-CM | POA: Insufficient documentation

## 2021-12-22 DIAGNOSIS — Z9989 Dependence on other enabling machines and devices: Secondary | ICD-10-CM | POA: Diagnosis not present

## 2021-12-22 DIAGNOSIS — Z7984 Long term (current) use of oral hypoglycemic drugs: Secondary | ICD-10-CM | POA: Diagnosis not present

## 2021-12-22 DIAGNOSIS — Z79899 Other long term (current) drug therapy: Secondary | ICD-10-CM | POA: Diagnosis not present

## 2021-12-22 DIAGNOSIS — I10 Essential (primary) hypertension: Secondary | ICD-10-CM

## 2021-12-22 DIAGNOSIS — I5022 Chronic systolic (congestive) heart failure: Secondary | ICD-10-CM

## 2021-12-22 DIAGNOSIS — I5023 Acute on chronic systolic (congestive) heart failure: Secondary | ICD-10-CM | POA: Diagnosis not present

## 2021-12-22 DIAGNOSIS — M109 Gout, unspecified: Secondary | ICD-10-CM | POA: Diagnosis not present

## 2021-12-22 DIAGNOSIS — Z95811 Presence of heart assist device: Secondary | ICD-10-CM

## 2021-12-22 DIAGNOSIS — F17200 Nicotine dependence, unspecified, uncomplicated: Secondary | ICD-10-CM | POA: Diagnosis not present

## 2021-12-22 DIAGNOSIS — T827XXA Infection and inflammatory reaction due to other cardiac and vascular devices, implants and grafts, initial encounter: Secondary | ICD-10-CM | POA: Diagnosis not present

## 2021-12-22 DIAGNOSIS — I480 Paroxysmal atrial fibrillation: Secondary | ICD-10-CM | POA: Diagnosis not present

## 2021-12-22 DIAGNOSIS — Z7901 Long term (current) use of anticoagulants: Secondary | ICD-10-CM | POA: Insufficient documentation

## 2021-12-22 DIAGNOSIS — Y828 Other medical devices associated with adverse incidents: Secondary | ICD-10-CM | POA: Insufficient documentation

## 2021-12-22 DIAGNOSIS — I428 Other cardiomyopathies: Secondary | ICD-10-CM | POA: Diagnosis not present

## 2021-12-22 DIAGNOSIS — R109 Unspecified abdominal pain: Secondary | ICD-10-CM | POA: Insufficient documentation

## 2021-12-22 DIAGNOSIS — E1122 Type 2 diabetes mellitus with diabetic chronic kidney disease: Secondary | ICD-10-CM | POA: Diagnosis not present

## 2021-12-22 DIAGNOSIS — F4321 Adjustment disorder with depressed mood: Secondary | ICD-10-CM | POA: Diagnosis not present

## 2021-12-22 DIAGNOSIS — G4733 Obstructive sleep apnea (adult) (pediatric): Secondary | ICD-10-CM | POA: Insufficient documentation

## 2021-12-22 DIAGNOSIS — F32A Depression, unspecified: Secondary | ICD-10-CM | POA: Insufficient documentation

## 2021-12-22 DIAGNOSIS — Z6841 Body Mass Index (BMI) 40.0 and over, adult: Secondary | ICD-10-CM | POA: Insufficient documentation

## 2021-12-22 DIAGNOSIS — N1832 Chronic kidney disease, stage 3b: Secondary | ICD-10-CM | POA: Diagnosis not present

## 2021-12-22 DIAGNOSIS — E1165 Type 2 diabetes mellitus with hyperglycemia: Secondary | ICD-10-CM | POA: Insufficient documentation

## 2021-12-22 DIAGNOSIS — I130 Hypertensive heart and chronic kidney disease with heart failure and stage 1 through stage 4 chronic kidney disease, or unspecified chronic kidney disease: Secondary | ICD-10-CM | POA: Diagnosis not present

## 2021-12-22 LAB — BASIC METABOLIC PANEL
Anion gap: 8 (ref 5–15)
BUN: 19 mg/dL (ref 6–20)
CO2: 22 mmol/L (ref 22–32)
Calcium: 9.6 mg/dL (ref 8.9–10.3)
Chloride: 109 mmol/L (ref 98–111)
Creatinine, Ser: 1.32 mg/dL — ABNORMAL HIGH (ref 0.61–1.24)
GFR, Estimated: >60 mL/min (ref >60)
Glucose, Bld: 91 mg/dL (ref 70–99)
Potassium: 3.7 mmol/L (ref 3.5–5.1)
Sodium: 139 mmol/L (ref 135–145)

## 2021-12-22 LAB — CBC
HCT: 41.2 % (ref 39.0–52.0)
Hemoglobin: 14 g/dL (ref 13.0–17.0)
MCH: 28.6 pg (ref 26.0–34.0)
MCHC: 34 g/dL (ref 30.0–36.0)
MCV: 84.3 fL (ref 80.0–100.0)
Platelets: 195 10*3/uL (ref 150–400)
RBC: 4.89 MIL/uL (ref 4.22–5.81)
RDW: 20.6 % — ABNORMAL HIGH (ref 11.5–15.5)
WBC: 8.8 10*3/uL (ref 4.0–10.5)
nRBC: 0 % (ref 0.0–0.2)

## 2021-12-22 LAB — PROTIME-INR
INR: 1.5 — ABNORMAL HIGH (ref 0.8–1.2)
Prothrombin Time: 17.8 seconds — ABNORMAL HIGH (ref 11.4–15.2)

## 2021-12-22 LAB — LACTATE DEHYDROGENASE: LDH: 158 U/L (ref 98–192)

## 2021-12-22 MED ORDER — LIDOCAINE HCL (PF) 1 % IJ SOLN
4.2000 mL | Freq: Once | INTRAMUSCULAR | Status: AC
  Administered 2021-12-22: 4.2 mL
  Filled 2021-12-22: qty 5

## 2021-12-22 MED ORDER — TRAMADOL HCL 50 MG PO TABS
50.0000 mg | ORAL_TABLET | Freq: Two times a day (BID) | ORAL | 5 refills | Status: DC
  Filled 2021-12-22: qty 20, 5d supply, fill #0
  Filled 2022-01-28: qty 20, 5d supply, fill #1

## 2021-12-22 MED ORDER — CEFTRIAXONE SODIUM 1 G IJ SOLR
2.0000 g | Freq: Once | INTRAMUSCULAR | Status: AC
  Administered 2021-12-22: 2 g via INTRAMUSCULAR
  Filled 2021-12-22: qty 20

## 2021-12-22 MED ORDER — AMOXICILLIN-POT CLAVULANATE 875-125 MG PO TABS
1.0000 | ORAL_TABLET | Freq: Two times a day (BID) | ORAL | 0 refills | Status: DC
  Filled 2021-12-22: qty 20, 10d supply, fill #0

## 2021-12-22 MED ORDER — PANTOPRAZOLE SODIUM 40 MG PO TBEC
40.0000 mg | DELAYED_RELEASE_TABLET | Freq: Every day | ORAL | 3 refills | Status: DC
  Filled 2021-12-22: qty 90, 90d supply, fill #0

## 2021-12-22 MED ORDER — MECLIZINE HCL 25 MG PO TABS
25.0000 mg | ORAL_TABLET | Freq: Three times a day (TID) | ORAL | 2 refills | Status: DC | PRN
  Filled 2021-12-22: qty 90, 30d supply, fill #0

## 2021-12-22 NOTE — Progress Notes (Signed)
?    ?VAD CLINIC Follow-up Visit ? ?PCP: Teena Dunk, NP ?HF MD: DB ? ? ?HPI: ? ?Jeffrey [name] is a 46 y.o. male with a history of  poorly controlled HTN, R renal cell carcinoma s/p nephrectomy, CKD IIIa, DM2, OSA, gout, morbid obesity and systolic HF due to NICM. S/p HM-3 VAD placement 07/21/21 ? ?Has [place]-sci S-ICD ?  ?HF dates back to 2012 with EF 25% ?  ?Admitted 7/22 with CHF/cardiogenic shock in setting AF with RVR. He was placed on amio gtt and underwent cardioversion. Echo EF < 20%, severe LV dilation, moderate RV dysfunction. Creatinine peaked at 4.65 improved to 1.58 which is his baselin ? Underwent HM3 LVAD by Dr. Cyndia Bent on 07/21/21. Had post-op AF requiring DC-CV. ? ?Follow up for Heart Failure/LVAD: ? ?Here for f/u. Had some DL trauma at the end of last week. Started on doxy. Here to f/u. Says site looks worse. Now having pain across his abdomen with copious drainage. No fevers or chills. Denies orthopnea or PND.No bleeding, melena or neuro symptoms. No VAD alarms. Depression symptoms better on higher dose of zoloft. ?  ?  ?VAD Indication: ?Destination Therapy due to smoking and BMI ?  ?VAD interrogation & Equipment Management: ?Speed: 6200 ?Flow: 4.6 ?Power: 5.2 w    ?PI: 4.6 ?Hct:  39 ?  ?Alarms: no clinical alarms ?Events: 20-40 daily ? ?Fixed speed 6200 ?Low speed limit: 5900 ?  ?Primary Controller:  Replace back up battery in 25 months. ?Back up controller:   Replace back up battery in 28 months. ?  ?Annual Equipment Maintenance on UBC/PM was performed on 07/21/21.  ?  ? ? ?Past Medical History:  ?Diagnosis Date  ? Anxiety   ? Aspirin allergy   ? Childhood asthma   ? Chronic systolic CHF (congestive heart failure) ([place])   ? a. EF 20-25% in 2012. b. EF 45-50% in 10/2011 with nonischemic nuc - presumed NICM. c. 12/2014 Echo: Sev depressed LV fxn, sev dil LV, mild LVH, mild MR, sev dil LA, mildly reduced RV fxn.  ? CKD (chronic kidney disease) stage 2, GFR 60-89 ml/min   ? H/O vasectomy 12/2019   ? High cholesterol   ? Hypertension   ? Morbid obesity ([place])   ? Nephrolithiasis   ? OSA on CPAP   ? Paroxysmal atrial fibrillation (HCC)   ? Presumed NICM   ? a. 04/2014 Myoview: EF 26%, glob HK, sev glob HK, ? prior infarct;  b. Never cathed 2/2 CKD.  ? Renal cell carcinoma ([place])   ? a. s/p Rt robotic assisted partial converted to radical nephrectomy on 01/2013.  ? Troponin level elevated   ? a. 04/2014, 12/2014: felt due to CHF.  ? Type II diabetes mellitus ([place])   ? Ventricular tachycardia   ? a. appropriate ICD therapy 12/2017  ? ? ?Current Outpatient Medications  ?Medication Sig Dispense Refill  ? Accu-Chek FastClix Lancets MISC USE AS DIRECTED FOUR TIMES DAILY 102 each 11  ? acetaminophen (TYLENOL) 325 MG tablet Take 2 tablets (650 mg total) by mouth every 6 (six) hours as needed for fever.    ? allopurinol (ZYLOPRIM) 100 MG tablet Take 1 tablet (100 mg total) by mouth daily. 90 tablet 3  ? amiodarone (PACERONE) 200 MG tablet Take 1 tablet (200 mg total) by mouth daily. 30 tablet 6  ? amoxicillin-clavulanate (AUGMENTIN) 875-125 MG tablet Take 1 tablet by mouth 2 (two) times daily. 20 tablet 0  ? colchicine 0.6 MG  tablet Take 1 tablet (0.6 mg total) by mouth daily. 30 tablet 6  ? diclofenac Sodium (VOLTAREN) 1 % GEL Apply 2 g topically 4 (four) times daily. 100 g 1  ? doxycycline (VIBRAMYCIN) 100 MG capsule Take 1 capsule (100 mg total) by mouth 2 (two) times daily for 7 days. 14 capsule 0  ? gabapentin (NEURONTIN) 300 MG capsule Take 1 capsule (300 mg total) by mouth 3 (three) times daily. 90 capsule 6  ? glucose blood test strip Use as directed 4 times daily. 100 each 11  ? insulin aspart protamine - aspart (NOVOLOG 70/30 MIX) (70-30) 100 UNIT/ML FlexPen Inject 38 Units into the skin 2 (two) times daily. 15 mL 11  ? Insulin Pen Needle (TRUEPLUS 5-BEVEL PEN NEEDLES) 29G X 12.7MM MISC Use as directed in the morning, at noon, and at bedtime. 100 each 3  ? Insulin Syringe-Needle U-100 (TRUEPLUS INSULIN SYRINGE) 31G  X 5/16" 0.5 ML MISC USE TO INJECT INSULIN TWICE DAILY. 100 each 11  ? isosorbide-hydrALAZINE (BIDIL) 20-37.5 MG tablet Take 2 tablets by mouth 3 (three) times daily. 180 tablet 6  ? metFORMIN (GLUCOPHAGE) 1000 MG tablet Take 1 tablet (1,000 mg total) by mouth 2 (two) times daily with a meal. 60 tablet 11  ? sacubitril-valsartan (ENTRESTO) 49-51 MG Take 1 tablet by mouth 2 (two) times daily. 60 tablet 6  ? sertraline (ZOLOFT) 100 MG tablet Take 1.5 tablets (150 mg total) by mouth daily. 45 tablet 6  ? spironolactone (ALDACTONE) 25 MG tablet Take 1 tablet (25 mg total) by mouth daily. 30 tablet 6  ? traZODone (DESYREL) 100 MG tablet Take 1 tablet (100 mg total) by mouth at bedtime. 30 tablet 0  ? warfarin (COUMADIN) 4 MG tablet Take 8 mg (2 tablets) every Monday/Wednesday/Friday and 4 mg (1 tablet) all other days or as directed by HF clinic 180 tablet 3  ? docusate sodium (COLACE) 100 MG capsule Take 1 capsule (100 mg total) by mouth 2 (two) times daily. (Patient not taking: Reported on 09/09/2021) 10 capsule 0  ? enoxaparin (LOVENOX) 60 MG/0.6ML injection Inject 0.6 mLs (60 mg total) into the skin every 12 (twelve) hours. (Patient not taking: Reported on 12/22/2021) 6 mL 0  ? meclizine (ANTIVERT) 25 MG tablet Take 1 tablet (25 mg total) by mouth 3 (three) times daily as needed for dizziness. 90 tablet 2  ? pantoprazole (PROTONIX) 40 MG tablet Take 1 tablet (40 mg total) by mouth daily. 90 tablet 3  ? traMADol (ULTRAM) 50 MG tablet Take 1-2 tablets (50-100 mg total) by mouth 2 (two) times daily. 60 tablet 5  ? ?Current Facility-Administered Medications  ?Medication Dose Route Frequency Provider Last Rate Last Admin  ? cefTRIAXone (ROCEPHIN) injection 2 g  2 g Intramuscular Once Raza Bayless, Shaune Pascal, MD      ? lidocaine (PF) (XYLOCAINE) 1 % injection 4.2 mL  4.2 mL Other Once Jessejames Steelman, Shaune Pascal, MD      ? ? ?Aspirin, Bee venom, Lisinopril, Shrimp (diagnostic), and Tomato ? ?REVIEW OF SYSTEMS: All systems negative except  as listed in HPI, PMH and Problem list. ? ? ?Vitals:  ? 12/22/21 1122  ?BP: (!) 140/0  ?Pulse: 72  ?Temp: 98.7 ?F (37.1 ?C)  ?TempSrc: Oral  ?SpO2: 98%  ?Weight: (!) 142.3 kg (313 lb 12.8 oz)  ?Height: '5\' 7"'$  (1.702 m)  ? ? ? ?Vital Signs:  ?Temp: 98.7 ?Doppler Pressure: 140             ?Automatc  BP: 143/107 (126) ?HR: 72 ?SPO2: 95% on RA ?  ?Weight: 313.8 lb w/ eqt ?Last weight:  315.8 lbs w/equt ?   ?General:  NAD.  ?HEENT: normal  ?Neck: supple. JVP not elevated.  Carotids 2+ bilat; no bruits. No lymphadenopathy or thryomegaly appreciated. ?Cor: LVAD hum.  ?Lungs: Clear. ?Abdomen: obese soft, mildly tender around DL and over to midline, non-distended.  + cellulitis. No hepatosplenomegaly. No bruits or masses. Good bowel sounds. Driveline site with drainage. Unable to tunnel Anchor in place.  ?Extremities: no cyanosis, clubbing, rash. Warm no edema  ?Neuro: alert & oriented x 3. No focal deficits. Moves all 4 without problem  ? ? ?ASSESSMENT AND PLAN: ? ?1. DL site infection ?- appears superficial but having a lot of pain. WBC is normal ?- d/w ID ?- Will switch doxy to Augmentin x 10 days ?- Give dose ceftriaxone in clinic ?- Will get CT to make sure no deep infection given degree of pain.  ?  ?2. Chronic systolic CHF ?- Nonischemic CM, end-stage ?- EF 2012; 20-25%  ?- Echo  8/22: EF < 20%, severe LV dilation, moderate RV dysfunction.   ?- RHC (8/22): well compensated hemodynamics. ?- s/p S-ICD ?- S/p HM3 LVAD 10/17 (DT) ?- Stable NYHA II. Volume stable ?- MAPs up but didn't take meds this am ?- Continue Bidil 2 tabs tid ?- Continue spironolactone 25 mg daily and entresto 49-51 mg BID.  ?- Stressed need to renew weight loss efforts. Once BMI ~ 35 will refer to [facility] for begin transplant evaluation (Body mass index is 49.15 kg/m?.) ?  ?3. VAD  ?- s/p HM-3 placement 07/21/21  ?- VAD interrogated personally. Parameters stable. ?- INR 1.5 Goal INR 2.0-3.0 Discussed dosing with PharmD personally. ?- LDH 158  ?-  Management of DL infection as above ?- Hgb 14.0  ?  ?4 Atrial fibrillation: Paroxysmal ?- Had severe AF with RVR 7/22 s/p DC-CV x 2 ?- Seen by EP. Not a candidate for ablation due to body habitus  (could cons

## 2021-12-22 NOTE — Progress Notes (Addendum)
Patient presents for 1 mo follow up in [facility] today alone. Concerns about increased drainage from DL site after trauma last week as noted in previous note.  ? ?Patient walked in clinic without assistance. Denies syncopal episodes, falls, or signs of bleeding. Reports continued mild lightheadedness/dizziness intermittently and states that this happens when he is just sitting on the couch. Requested refill for meclizine - sent.  ? ?BP elevated today - patient has not taken any medications today and is in pain at this moment. He does confirm he increased his Bidil to 2 tabs bid as instructed last visit. Patient is requesting refill on Tramadol - approved by Dr. [name] and called to local pharmacy.  ? ?Pt confirms he increased his Zoloft to 150 mg daily as instructed last visit and reports depression symptoms are "better". He is aware he can reach out to Raquel Sarna, LCSW if he needs any further assistance including counseling. He says he just doesn't feel he needs it right now.  ? ?Patient confirms his DL dressing was changed last night by his wife. Dr. [name] in to assess - see notes below. Patient received 2 gm IM ceftriaxone today. Per Dr. [name], patient will stop Doxy and start Augmentin twice daily x 10 days; Rx sent to local pharmacy.  ? ?Vital Signs:  ?Temp: 98.7 ?Doppler Pressure: 140  ?Automatc BP: 143/107 (126) ?HR: 72 ?SPO2: 95% on RA ?  ?Weight: 313.8 lb w/ eqt ?Last weight:  315.8 lbs w/equt ? ? ?VAD Indication: ?Destination Therapy due to smoking and BMI ?  ?VAD interrogation & Equipment Management: ?Speed: 6200 ?Flow: 4.6 ?Power: 5.2 w    ?PI: 4.6 ?Hct:  39 ?  ?Alarms: no clinical alarms ?Events: 20-40 daily ? ?Fixed speed 6200 ?Low speed limit: 5900 ?  ?Primary Controller:  Replace back up battery in 25 months. ?Back up controller:   Replace back up battery in 28 months. ?  ?Annual Equipment Maintenance on UBC/PM was performed on 07/21/21.  ?  ?I reviewed the LVAD parameters from today  and compared the results to the patient's prior recorded data. LVAD interrogation was NEGATIVE for significant power changes, NEGATIVE for clinical alarms and POSITIVE for PI events/speed drops. No programming changes were made and pump is functioning within specified parameters. Pt is performing daily controller and system monitor self tests along with completing weekly and monthly maintenance for LVAD equipment. ?  ?LVAD equipment check completed and is in good working order. Back-up equipment present.  ?  ?Exit Site Care: ?Drive line is being maintained daily by [name] his wife. Drive line anchor is unattached.  Existing gauze VAD dressing removed and site care performed using sterile technique. Drive line exit site cleaned with Chlora prep applicators x 2, allowed to dry, and gauze dressing with silver strip applied. Exit site healed and incorporated, the velour is fully implanted at exit site. Tenderness right midline abdomen with redness. Exit site with yellow drainage with no foul odor. No tunneling, culture obtained. Continue daily dressing changes using silver strip around exit site; provided with 7 daily kits and 7 anchors. Pt denies fever or chills.  ? ? ? ?Device: BS ?Therapies: on at 250 BPM ?Last check: 12/13/21 ? ? ?BP & Labs:  ?Doppler 140 - Doppler is reflecting Modified systolic ?  ?Hgb 14.0 - No S/S of bleeding. Specifically denies melena/BRBPR or nosebleeds. ?  ?LDH stable at 158 with established baseline of 160 - 300. Denies tea-colored urine. No power elevations noted on interrogation.  ? ? ?  Plan:  ?Stop Doxycycline. ?Start Augmentin antibiotic twice daily for 10 days. ?Continue daily dressing changes or as often as needed to keep dressing clean and dry. ?Will schedule outpatient CT chest/abdomen/pelvis when insurance authorization is obtained and call you with date and time.  ?Return in one week for wound check.  ?Lauren (PharmD) will call you with INR results and warfarin dosing.  ? ? ?Zada Girt RN ?VAD Coordinator  ?Office: [phone]  ?24/7 Pager: [phone]  ? ? ?

## 2021-12-22 NOTE — Progress Notes (Signed)
LVAD INR 

## 2021-12-22 NOTE — Patient Instructions (Addendum)
Stop Doxycycline. ?Start Augmentin antibiotic twice daily for 10 days. ?Continue daily dressing changes or as often as needed to keep dressing clean and dry. ?Will schedule outpatient CT chest/abdomen/pelvis when insurance authorization is obtained and call you with date and time.  ?Return in one week for wound check.  ?Lauren (PharmD) will call you with INR results and warfarin dosing.  ?

## 2021-12-22 NOTE — Telephone Encounter (Signed)
Patient called VAD pager to report increased drainage from DL site along with pain. He says wife changed dressing this morning and now gauze is saturated. She describes drainage as "bloody, yellow, greenish" with tenderness around DL site. She confirms patient started antibiotic (Doxy) as instructed.  ? ?Patient has tramadol available, asked them to try that for pain. Patient has VAD clinic appointment tomorrow at 11:00, asked that they come at 09:00 am; patient may possibly need CT to assess. Both agreed to same. ? ?Zada Girt RN, VAD Coordinator ?[phone] ?

## 2021-12-23 ENCOUNTER — Telehealth (HOSPITAL_COMMUNITY): Payer: Self-pay | Admitting: *Deleted

## 2021-12-23 ENCOUNTER — Other Ambulatory Visit (HOSPITAL_COMMUNITY): Payer: Self-pay | Admitting: *Deleted

## 2021-12-23 DIAGNOSIS — T827XXA Infection and inflammatory reaction due to other cardiac and vascular devices, implants and grafts, initial encounter: Secondary | ICD-10-CM

## 2021-12-23 DIAGNOSIS — R1030 Lower abdominal pain, unspecified: Secondary | ICD-10-CM

## 2021-12-23 DIAGNOSIS — Z95811 Presence of heart assist device: Secondary | ICD-10-CM

## 2021-12-23 NOTE — Telephone Encounter (Signed)
CT abd/pel w/o  ? ?Auth# [id number] exp. 02/20/22 ?

## 2021-12-24 ENCOUNTER — Ambulatory Visit (HOSPITAL_BASED_OUTPATIENT_CLINIC_OR_DEPARTMENT_OTHER): Payer: Medicaid Other

## 2021-12-25 ENCOUNTER — Other Ambulatory Visit (HOSPITAL_COMMUNITY): Payer: Self-pay | Admitting: Unknown Physician Specialty

## 2021-12-25 ENCOUNTER — Other Ambulatory Visit: Payer: Self-pay

## 2021-12-25 ENCOUNTER — Ambulatory Visit (HOSPITAL_BASED_OUTPATIENT_CLINIC_OR_DEPARTMENT_OTHER)
Admission: RE | Admit: 2021-12-25 | Discharge: 2021-12-25 | Disposition: A | Discharge status: Home / Self Care | Payer: Medicaid Other | Source: Ambulatory Visit | Attending: Internal Medicine | Admitting: Internal Medicine

## 2021-12-25 DIAGNOSIS — R1030 Lower abdominal pain, unspecified: Secondary | ICD-10-CM | POA: Insufficient documentation

## 2021-12-25 DIAGNOSIS — T827XXA Infection and inflammatory reaction due to other cardiac and vascular devices, implants and grafts, initial encounter: Secondary | ICD-10-CM | POA: Insufficient documentation

## 2021-12-25 DIAGNOSIS — Z95811 Presence of heart assist device: Secondary | ICD-10-CM | POA: Diagnosis not present

## 2021-12-25 DIAGNOSIS — R109 Unspecified abdominal pain: Secondary | ICD-10-CM | POA: Diagnosis not present

## 2021-12-25 NOTE — Addendum Note (Signed)
Addended by: Tanda Rockers B on: 12/25/2021 08:53 AM ? ? Modules accepted: Orders ? ?

## 2021-12-26 ENCOUNTER — Other Ambulatory Visit (HOSPITAL_COMMUNITY): Payer: Self-pay | Admitting: *Deleted

## 2021-12-26 ENCOUNTER — Other Ambulatory Visit: Payer: Self-pay

## 2021-12-26 DIAGNOSIS — T827XXA Infection and inflammatory reaction due to other cardiac and vascular devices, implants and grafts, initial encounter: Secondary | ICD-10-CM

## 2021-12-26 DIAGNOSIS — I5023 Acute on chronic systolic (congestive) heart failure: Secondary | ICD-10-CM

## 2021-12-26 DIAGNOSIS — Z7901 Long term (current) use of anticoagulants: Secondary | ICD-10-CM

## 2021-12-26 MED ORDER — AMOXICILLIN-POT CLAVULANATE 875-125 MG PO TABS
1.0000 | ORAL_TABLET | Freq: Two times a day (BID) | ORAL | 0 refills | Status: AC
  Filled 2021-12-26 – 2021-12-30 (×4): qty 60, 30d supply, fill #0

## 2021-12-26 NOTE — Telephone Encounter (Signed)
CT results from yesterday reviewed with Janene Madeira NP with Infectious Disease. Per her recommendations will extend Augmentin for 1 month to cover MSSA. If pt begins experiencing side effects from Augmentin, may change to Cefadroxil 1 gm BID. Awaiting feedback/recommendations from surgeons on CT report.  ? ?Called and spoke with pt regarding CT results, and plan to extend Augmentin. He verbalized understanding. Reports he is changing dressing twice daily due to drainage. Reports tenderness is improving. Denies fever/chills. Will see pt for follow up appt 12/29/21 at 10:00.  ? ?[name] RN ?VAD Coordinator  ?Office: [phone]  ?24/7 Pager: [phone]  ? ?

## 2021-12-27 LAB — AEROBIC/ANAEROBIC CULTURE W GRAM STAIN (SURGICAL/DEEP WOUND): Gram Stain: NONE SEEN

## 2021-12-29 ENCOUNTER — Ambulatory Visit (HOSPITAL_COMMUNITY)
Admission: RE | Admit: 2021-12-29 | Discharge: 2021-12-29 | Disposition: A | Discharge status: Home / Self Care | Payer: Medicaid Other | Source: Ambulatory Visit | Attending: Cardiology | Admitting: Cardiology

## 2021-12-29 ENCOUNTER — Other Ambulatory Visit: Payer: Self-pay

## 2021-12-29 ENCOUNTER — Ambulatory Visit (HOSPITAL_COMMUNITY): Payer: Self-pay | Admitting: Pharmacist

## 2021-12-29 ENCOUNTER — Other Ambulatory Visit (HOSPITAL_COMMUNITY): Payer: Self-pay | Admitting: *Deleted

## 2021-12-29 DIAGNOSIS — Z7901 Long term (current) use of anticoagulants: Secondary | ICD-10-CM

## 2021-12-29 DIAGNOSIS — Z95811 Presence of heart assist device: Secondary | ICD-10-CM

## 2021-12-29 DIAGNOSIS — Z4801 Encounter for change or removal of surgical wound dressing: Secondary | ICD-10-CM | POA: Insufficient documentation

## 2021-12-29 LAB — PROTIME-INR
INR: 1.7 — ABNORMAL HIGH (ref 0.8–1.2)
Prothrombin Time: 20.2 seconds — ABNORMAL HIGH (ref 11.4–15.2)

## 2021-12-29 NOTE — Patient Instructions (Signed)
Continue daily dressing changes ?Coumadin dosing per Ander Purpura PharmD ?Return to [facility] in 1 week for INR & wound check ?

## 2021-12-29 NOTE — Progress Notes (Signed)
LVAD INR 

## 2021-12-29 NOTE — Progress Notes (Signed)
Pt presents to VAD clinic for INR and wound check today alone.  ? ?Discussed pt's CT scan from last week with Dr Cyndia Bent. Plan for now will be to treat with prolonged antibiotics and monitor wound progress. Pt currently taking Augmentin 875-125 mg BID. Tolerating well.  ? ?Exit Site Care: ?Drive line is being maintained daily by [name] his wife. Existing gauze VAD dressing removed and site care performed using sterile technique. Drive line exit site cleaned with Chlora prep applicators x 2, allowed to dry, and gauze dressing with silver strip applied. Exit site healed and incorporated, the velour is fully implanted at exit site. Tenderness right midline abdomen with redness. Exit site with yellow drainage with no foul odor. No tunneling, culture obtained. Continue daily dressing changes using silver strip around exit site; provided with 14 daily kits and 5 anchors. Pt denies fever or chills.  ? ? ?Plan:  ?Continue daily dressing changes ?Coumadin dosing per Ander Purpura PharmD ?Return to [facility] clinic in 1 week for INR & wound check ? ?[name] RN ?VAD Coordinator  ?Office: [phone]  ?24/7 Pager: [phone]  ? ?

## 2021-12-30 ENCOUNTER — Other Ambulatory Visit (HOSPITAL_COMMUNITY): Payer: Medicaid Other

## 2021-12-30 ENCOUNTER — Other Ambulatory Visit: Payer: Self-pay

## 2022-01-01 ENCOUNTER — Telehealth (HOSPITAL_COMMUNITY): Payer: Self-pay | Admitting: Pharmacy Technician

## 2022-01-01 ENCOUNTER — Other Ambulatory Visit (HOSPITAL_COMMUNITY): Payer: Self-pay

## 2022-01-01 NOTE — Telephone Encounter (Signed)
Advanced Heart Failure Patient Advocate Encounter ? ?Prior Authorization for Jeffrey [name] has been submitted and approved.   ? ?PA# [id number] ?Effective dates: 01/01/22 through 01/01/23 ? ?Charlann Boxer, CPhT ? ? ? ?

## 2022-01-02 ENCOUNTER — Other Ambulatory Visit (HOSPITAL_COMMUNITY): Payer: Self-pay | Admitting: *Deleted

## 2022-01-02 ENCOUNTER — Other Ambulatory Visit: Payer: Self-pay

## 2022-01-02 DIAGNOSIS — Z7901 Long term (current) use of anticoagulants: Secondary | ICD-10-CM

## 2022-01-02 DIAGNOSIS — Z95811 Presence of heart assist device: Secondary | ICD-10-CM

## 2022-01-02 NOTE — Patient Instructions (Signed)
Visit Information ? ?Mr. Guilbault was given information about Medicaid Managed Care team care coordination services as a part of their Healthy [facility] Medicaid benefit. [name] verbally consented to engagement with the [facility] Managed Care team.  ? ?If you are experiencing a medical emergency, please call 911 or report to your local emergency department or urgent care.  ? ?If you have a non-emergency medical problem during routine business hours, please contact your provider's office and ask to speak with a nurse.  ? ?For questions related to your Healthy [facility] health plan, please call: [phone] or visit the homepage here: [email or web address] ? ?If you would like to schedule transportation through your Healthy [facility] plan, please call the following number at least 2 days in advance of your appointment: [phone] ? For information about your ride after you set it up, call Ride Assist at [phone]. Use this number to activate a Will Call pickup, or if your transportation is late for a scheduled pickup. Use this number, too, if you need to make a change or cancel a previously scheduled reservation. ? If you need transportation services right away, call [phone]. The after-hours call center is staffed 24 hours to handle ride assistance and urgent reservation requests (including discharges) 365 days a year. Urgent trips include sick visits, hospital discharge requests and life-sustaining treatment. ? ?Call the [place] at [phone], at any time, 24 hours a day, 7 days a week. If you are in danger or need immediate medical attention call 911. ? ?If you would like help to quit smoking, call 1-800-QUIT-NOW 269 375 9800) OR Espa?ol: 1-855-D?jelo-Ya 908-835-5376) o para m?s informaci?n haga clic aqu? or Text READY to 200-400 to register via text ? ?Mr. Oyola - following are the goals we discussed in your visit today:  ? Goals  Addressed   ?None ?  ? ? ? ? ?Social Worker will follow up in 30-60 days .  ? ?[name], BSW, [place] ?[place]  ?High Risk Managed Medicaid Team  ?[phone]  ? ?Following is a copy of your plan of care:  ?Care Plan : General Plan of Care (Adult)  ?Updates made by Ethelda Chick since 01/02/2022 12:00 AM  ?  ? ?Problem: Health Promotion or Disease Self-Management (General Plan of Care)   ?Priority: High  ?Onset Date: 09/24/2020  ?Note:   ?Current Barriers:  ?Chronic Disease Management support and education needs, S/P LVAD 07/21/21 ?12/12/21:  patient still awaiting food stamp and disability determination-needs dental resources. Patient c/o depression today-Zoloft increased.  Patient to start HF and LVAD support group which meets 3rd Tuesday of each month.  Declines therapy services at this time. ? ?Nurse Case Manager Clinical Goal(s):  ?Over the next 90 days, patient will attend all scheduled medical appointments: ?Over the next 30 days, patient wil attend HF and LVAD support group ?Over the next 30 days, patient will receive resources ? ?Interventions:  ?Inter-disciplinary care team collaboration (see longitudinal plan of care) ?Collaborated with BSW regarding food stamps and dental resources. ?BSW referral for food stamp updates and dental resources. ?Evaluation of current treatment plan and patient's adherence to plan as established by provider.  ?BSW contacted patient regarding daycare voucher and foodstamps. BSW informed patient he would have to contact DSS and have the child's name placed on the waitlist, patient can also apply for foodstamps at the same time. Patient reports no other resources needed at this time.  ?09/04/21: BSW spoke with patient, he  stated he is not sure if they have been placed on the daycare voucher because his wife has not said anything to him yet. Patient stated they have applied for foodstamps but have not received them, he thinks its because his  wife makes to much money. No other resources are needed at this time. ?10/22/21: Bsw completed a follow up telephone call with patient regarding dental resources and follow up with his foodstamps. Patient stated he would like for dental resources to be mailed to him. Patient asked BSW if she could provide an update on his foodstamp application, BSW informed patient he would have to contact DSS call center or his worker to get an update on his foodstamp application. No other resources are needed at this time. ?12/15/21: BSW completed telephone outreach with patient. He states he did not receieve the resources for dental. BSW will resend letter. BSW informed patient it may benefit him to going up to DSS to inquire about his foodstamp application. Sometimes it is easier to go up there. Patient stated he was going to try the group session for now and is not interested in therapy, BSW did provide him with the information for [facility].No other resources are needed at this time. ?01/02/22: BSW completed telephone outreach with patient. He states he did receive the letter with the resources and is waiting on call backs. Patient states he has not made it down to DSS yet to check on his foodstamp application. No other resources are needed at this time.  ?Reviewed medications with patient. ?Discussed plans with patient for ongoing care management follow up and provided patient with direct contact information for care management team ?Provided patient with educational materials related to exercise. ?Reviewed scheduled/upcoming provider appointments. ?Collaborated with Pharmacy ?Pharmacy referral for medication review ? ?Patient Goals/Self-Care Activities ?Over the next 90 days, patient will: ? -Attends all scheduled provider appointments ?Calls provider office for new concerns or questions ? ?Follow Up Plan: The Managed Medicaid care management team will reach out to the patient again over the next 30 days.  ?The patient has been  provided with contact information for the Managed Medicaid care management team and has been advised to call with any health related questions or concerns.  ? ?  ?  ? ?  ?

## 2022-01-02 NOTE — Patient Outreach (Signed)
?Medicaid Managed Care ?Social Work Note ? ?01/02/2022 ?Name:  Jeffrey [name] MRN:  [id number] DOB:  November 14, 1975 ? ?Jeffrey [name] is an 46 y.o. year old male who is a primary patient of [place], Jeffrey Church I, NP.  The [facility] Managed Care Coordination team was consulted for assistance with:  Community Resources  ? ?Jeffrey [name] was given information about Medicaid Managed Care Coordination team services today. Jeffrey [name] Patient agreed to services and verbal consent obtained. ? ?Engaged with patient  for by telephone forfollow up visit in response to referral for case management and/or care coordination services.  ? ?Assessments/Interventions:  Review of past medical history, allergies, medications, health status, including review of consultants reports, laboratory and other test data, was performed as part of comprehensive evaluation and provision of chronic care management services. ? ?SDOH: (Social Determinant of Health) assessments and interventions performed: ?BSW completed telephone outreach with patient. He states he did receive the letter with the resources and is waiting on call backs. Patient states he has not made it down to DSS yet to check on his foodstamp application. No other resources are needed at this time.  ? ?Advanced Directives Status:  Not addressed in this encounter. ? ?Care Plan ?                ?Allergies  ?Allergen Reactions  ? Aspirin Shortness Of Breath, Itching and Rash  ?   Burning sensation (Patient reports he tolerates other NSAIDS)   ? Bee Venom Hives and Swelling  ? Lisinopril Cough  ? Shrimp (Diagnostic) Rash  ? Tomato Rash  ? ? ?Medications Reviewed Today   ? ? Reviewed by Lezlie Octave, RN (Registered Nurse) on 12/22/21 at 1039  Med List Status: <None>  ? ?Medication Order Taking? Sig Documenting Provider Last Dose Status Informant  ?Accu-Chek FastClix Lancets MISC [id number] Yes USE AS DIRECTED FOUR TIMES DAILY Vevelyn Francois, NP Taking Active   ?acetaminophen (TYLENOL) 325 MG tablet  [id number] Yes Take 2 tablets (650 mg total) by mouth every 6 (six) hours as needed for fever. Darrick Grinder D, NP Taking Active Self  ?allopurinol (ZYLOPRIM) 100 MG tablet [id number] Yes Take 1 tablet (100 mg total) by mouth daily. Cathlyn Parsons, PA-C Taking Active Self  ?amiodarone (PACERONE) 200 MG tablet [id number] Yes Take 1 tablet (200 mg total) by mouth daily. Darrick Grinder D, NP Taking Active   ?colchicine 0.6 MG tablet [id number] Yes Take 1 tablet (0.6 mg total) by mouth daily. Darrick Grinder D, NP Taking Active   ?diclofenac Sodium (VOLTAREN) 1 % GEL [id number] Yes Apply 2 g topically 4 (four) times daily. Izora Ribas, MD Taking Active   ?docusate sodium (COLACE) 100 MG capsule [id number] No Take 1 capsule (100 mg total) by mouth 2 (two) times daily.  ?Patient not taking: Reported on 09/09/2021  ? Darrick Grinder D, NP Not Taking Active Self  ?doxycycline (VIBRAMYCIN) 100 MG capsule [id number] Yes Take 1 capsule (100 mg total) by mouth 2 (two) times daily for 7 days. Darrick Grinder D, NP Taking Active   ?enoxaparin (LOVENOX) 60 MG/0.6ML injection [id number] No Inject 0.6 mLs (60 mg total) into the skin every 12 (twelve) hours.  ?Patient not taking: Reported on 12/22/2021  ? Bensimhon, Shaune Pascal, MD Not Taking Active   ?gabapentin (NEURONTIN) 300 MG capsule [id number] Yes Take 1 capsule (300 mg total) by mouth 3 (three) times daily. Bensimhon, Shaune Pascal, MD Taking Active   ?glucose blood test strip [id number] Yes Use  as directed 4 times daily. Bo Merino I, NP Taking Active   ?insulin aspart protamine - aspart (NOVOLOG 70/30 MIX) (70-30) 100 UNIT/ML FlexPen [id number] Yes Inject 38 Units into the skin 2 (two) times daily. Milford, Maricela Bo, FNP Taking Active Self  ?Insulin Pen Needle (TRUEPLUS 5-BEVEL PEN NEEDLES) 29G X 12.7MM MISC [id number] Yes Use as directed in the morning, at noon, and at bedtime. Teena Dunk, NP Taking Active   ?Insulin Syringe-Needle U-100 (TRUEPLUS INSULIN SYRINGE) 31G X 5/16" 0.5 ML MISC  [id number] Yes USE TO INJECT INSULIN TWICE DAILY. Bo Merino I, NP Taking Active   ?isosorbide-hydrALAZINE (BIDIL) 20-37.5 MG tablet [id number] Yes Take 2 tablets by mouth 3 (three) times daily. Bensimhon, Shaune Pascal, MD Taking Active   ?meclizine (ANTIVERT) 25 MG tablet [id number] Yes Take 1 tablet (25 mg total) by mouth 3 (three) times daily as needed for dizziness. Darrick Grinder D, NP Taking Active   ?metFORMIN (GLUCOPHAGE) 1000 MG tablet [id number] Yes Take 1 tablet (1,000 mg total) by mouth 2 (two) times daily with a meal. Milford, Maricela Bo, FNP Taking Active Self  ?pantoprazole (PROTONIX) 40 MG tablet [id number] Yes Take 1 tablet (40 mg total) by mouth daily. Bensimhon, Shaune Pascal, MD Taking Active   ?sacubitril-valsartan (ENTRESTO) 49-51 MG [id number] Yes Take 1 tablet by mouth 2 (two) times daily. Clegg, Amy D, NP Taking Active   ?sertraline (ZOLOFT) 100 MG tablet [id number] Yes Take 1.5 tablets (150 mg total) by mouth daily. Bensimhon, Shaune Pascal, MD Taking Active   ?spironolactone (ALDACTONE) 25 MG tablet [id number] Yes Take 1 tablet (25 mg total) by mouth daily. Cathlyn Parsons, PA-C Taking Active Self  ?traMADol (ULTRAM) 50 MG tablet [id number] Yes Take 1-2 tablets (50-100 mg total) by mouth every 4 (four) hours as needed for moderate pain. Darrick Grinder D, NP Taking Active   ?traZODone (DESYREL) 100 MG tablet [id number] Yes Take 1 tablet (100 mg total) by mouth at bedtime. Bo Merino I, NP Taking Active   ?warfarin (COUMADIN) 4 MG tablet [id number] Yes Take 8 mg (2 tablets) every Monday/Wednesday/Friday and 4 mg (1 tablet) all other days or as directed by HF clinic Bensimhon, Shaune Pascal, MD Taking Active   ? ?  ?  ? ?  ? ? ?Patient Active Problem List  ? Diagnosis Date Noted  ? Presence of left ventricular assist device (LVAD) ([place]) 07/25/2021  ? Acute on chronic systolic CHF (congestive heart failure) ([place]) 07/18/2021  ? Cardiogenic shock ([place])   ? Atrial fibrillation with RVR ([place]) 04/30/2021  ? Acute  hypoxemic respiratory failure ([place])   ? Right wrist pain 04/16/2021  ? Absolute anemia   ? Rectal bleeding   ? Iron deficiency 12/25/2020  ? Long term current use of anticoagulant - apixaban 12/25/2020  ? Paroxysmal atrial fibrillation (HCC)   ? Lactic acidosis 11/22/2020  ? Hypotension 11/22/2020  ? Aspiration into airway   ? ICD (implantable cardioverter-defibrillator) battery depletion 05/15/2020  ? Endotracheal tube present   ? Respiratory failure ([place])   ? Acute encephalopathy   ? Acute pulmonary edema (HCC)   ? NICM (nonischemic cardiomyopathy) ([place]) 04/24/2020  ? S/P vasectomy 12/20/2019  ? Anxiety 12/20/2019  ? Hemoglobin A1C between 7% and 9% indicating borderline diabetic control ([place]) 12/20/2019  ? Seasonal allergies 12/20/2019  ? Insomnia 12/20/2019  ? Vasectomy evaluation 06/20/2019  ? Visual problems 06/20/2019  ? Blurry vision, bilateral 06/20/2019  ? Hyperglycemia 02/13/2019  ? Hyperosmolar (nonketotic) coma ([place]) 01/19/2019  ? AICD (automatic  cardioverter/defibrillator) present 01/19/2019  ? Hyperlipidemia 12/07/2018  ? Follow-up exam 11/22/2018  ? Class 3 severe obesity due to excess calories with serious comorbidity and body mass index (BMI) of 45.0 to 49.9 in adult [facility]) 11/22/2018  ? Dizziness 11/22/2018  ? Lingular pneumonia 08/04/2018  ? Ventricular tachycardia 12/30/2017  ? PAF (paroxysmal atrial fibrillation) ([place])   ? Dilated cardiomyopathy ([place])   ? Pollen allergy 02/17/2017  ? Dyslipidemia 02/17/2017  ? Hematochezia 11/08/2015  ? CKD (chronic kidney disease), stage III ([place]) 06/20/2015  ? Cardiac defibrillator in situ   ? Chronic systolic CHF (congestive heart failure) ([place]) 03/11/2015  ? Gouty arthritis 03/11/2015  ? Chest pain 12/28/2014  ? Acute renal failure superimposed on stage 3 chronic kidney disease ([place]) 04/08/2014  ? Aspirin allergy 04/08/2014  ? Renal cell carcinoma ([place])   ? Gout attack 11/10/2011  ? Morbid obesity ([facility]) 10/23/2011  ? OSA (obstructive sleep apnea) 10/23/2011   ? Type 2 diabetes mellitus with stage 3 chronic kidney disease, with long-term current use of insulin ([place]) 10/22/2011  ? Hyperlipidemia associated with type 2 diabetes mellitus ([place]) 12/25/2010  ? Hypertension a

## 2022-01-05 ENCOUNTER — Ambulatory Visit (HOSPITAL_COMMUNITY)
Admission: RE | Admit: 2022-01-05 | Discharge: 2022-01-05 | Disposition: A | Discharge status: Home / Self Care | Payer: Medicaid Other | Source: Ambulatory Visit | Attending: Internal Medicine | Admitting: Internal Medicine

## 2022-01-05 ENCOUNTER — Ambulatory Visit (HOSPITAL_COMMUNITY): Payer: Self-pay | Admitting: Pharmacist

## 2022-01-05 ENCOUNTER — Other Ambulatory Visit: Payer: Self-pay

## 2022-01-05 DIAGNOSIS — Z7901 Long term (current) use of anticoagulants: Secondary | ICD-10-CM | POA: Insufficient documentation

## 2022-01-05 DIAGNOSIS — Z4801 Encounter for change or removal of surgical wound dressing: Secondary | ICD-10-CM | POA: Diagnosis not present

## 2022-01-05 DIAGNOSIS — Z95811 Presence of heart assist device: Secondary | ICD-10-CM | POA: Insufficient documentation

## 2022-01-05 LAB — PROTIME-INR
INR: 2 — ABNORMAL HIGH (ref 0.8–1.2)
Prothrombin Time: 22.6 seconds — ABNORMAL HIGH (ref 11.4–15.2)

## 2022-01-05 NOTE — Progress Notes (Signed)
Pt presents to VAD clinic for INR and wound check today alone. Plan will be to treat with prolonged antibiotics and monitor wound progress. Pt currently taking Augmentin 875-125 mg BID. Tolerating well.  ? ?Exit Site Care: ?Drive line is being maintained daily by [name] his wife. Existing gauze VAD dressing removed and site care performed using sterile technique. Drive line exit site cleaned with Chlora prep applicators x 2, allowed to dry, and gauze dressing with silver strip applied. Exit site healed and incorporated, the velour is fully implanted at exit site.  Exit site with small amount of yellow drainage with no foul odor. No redness or tenderness. Advance to every other day dressing changes using silver strip around exit site; provided with 14 daily kits and 5 anchors. Pt denies fever or chills.  ? ? ? ? ? ?Plan:  ?Advance to every other day dressing changes. Pt instructed to call if drainage increases or if he develops pain or redness at the site.  ?Coumadin dosing per Ander Purpura PharmD ?Return to [place] clinic at regular schedule appt on 4/26. ? ?Tanda Rockers RN ?VAD Coordinator  ?Office: [phone]  ?24/7 Pager: [phone]  ? ?

## 2022-01-05 NOTE — Progress Notes (Signed)
LVAD INR 

## 2022-01-06 ENCOUNTER — Telehealth (HOSPITAL_COMMUNITY): Payer: Self-pay | Admitting: *Deleted

## 2022-01-06 ENCOUNTER — Telehealth: Payer: Self-pay

## 2022-01-06 ENCOUNTER — Ambulatory Visit (HOSPITAL_COMMUNITY)
Admission: RE | Admit: 2022-01-06 | Discharge: 2022-01-06 | Disposition: A | Discharge status: Home / Self Care | Payer: Medicaid Other | Source: Ambulatory Visit | Attending: Cardiology | Admitting: Cardiology

## 2022-01-06 DIAGNOSIS — Z95811 Presence of heart assist device: Secondary | ICD-10-CM | POA: Diagnosis not present

## 2022-01-06 DIAGNOSIS — Z452 Encounter for adjustment and management of vascular access device: Secondary | ICD-10-CM | POA: Insufficient documentation

## 2022-01-06 NOTE — Telephone Encounter (Signed)
Transmission reviewed, no episodes, battery life 82%, electrode impedance OK advised him to call LVAD clinic back to possibly have that checked out in office, and if needed [employer] rep to come out and check the SICD. Patient voiced understanding.  ? ? ?

## 2022-01-06 NOTE — Telephone Encounter (Signed)
Received call from patient stating he has a "beeping" noise "coming from inside my chest." He is adamant beeping is not coming from LVAD controller or batteries. Reports VAD numbers are stable. Pt has a Product/process development scientist. Advised him to send remote transmission and call the device clinic to report beeping. Provided with device clinic phone number: [phone]. He verbalized understanding.  ? ?[name] RN ?VAD Coordinator  ?Office: [phone]  ?24/7 Pager: [phone]  ? ? ?

## 2022-01-06 NOTE — Telephone Encounter (Signed)
Patient called in stating theres a beep noise coming from the inside of his chest. Patient states this started today. No complaints of any kind. Patient is sending in a remote transmission for it to be reviewed  ?

## 2022-01-06 NOTE — Progress Notes (Signed)
Patient presents to clinic stating he has a "beeping" noise "coming from inside my chest." Reports his daughter heard beeping at home. Pt has a Product/process development scientist. Advised him to send remote transmission and call the device clinic to report beeping. Remote transmission reviewed- no episodes, battery life 82%. He was advised by device clinic to come to [place] [facility] to have VAD interrogated.  ? ?No beeping heard upon arrival to clinic. Pt states he is unsure if any beeping occurred in the car as they were listening to the radio on the way here. I interrogated VAD- no erroneous power disconnect alarms, or alarms of any kind noted. I took both batteries/clips out of his Merchant navy officer. Lightly shook both to make sure battery not losing contact with clip. No beeping noted.  ? ?Unsure what the beeping noise is that he is hearing. Advised it may be something in his home (ie. smoke alarm, CO2 detector, air filter alarm, etc.)  ?  ?VAD interrogation & Equipment Management: ?Speed: 8466 ?Flow: 5.2 ?Power: 5.3 w    ?PI: 2.7 ?Hct: 39 ?  ?Alarms: no clinical alarms ?Events: 20-40 daily ? ?Fixed speed 6200 ?Low speed limit: 5900 ?  ?Primary Controller:  Replace back up battery in 25 months. ? ?Device: BS ?Therapies: on at 250 BPM ?Last check: 12/13/21-- remote sent today ? ?Plan: ?Pt to call VAD coordinator if VAD beeping/alarming occurs.  ? ?[name] RN ?VAD Coordinator  ?Office: [phone]  ?24/7 Pager: [phone]  ? ? ? ?

## 2022-01-12 ENCOUNTER — Other Ambulatory Visit: Payer: Self-pay | Admitting: Obstetrics and Gynecology

## 2022-01-12 NOTE — Patient Instructions (Signed)
Hi Jeffrey [name] for speaking with me-have a wonderful day!! ? ?Jeffrey [name] was given information about Medicaid Managed Care team care coordination services as a part of their Healthy [facility] Medicaid benefit. Jeffrey [name] verbally consented to engagement with the [facility] Managed Care team.  ? ?If you are experiencing a medical emergency, please call 911 or report to your local emergency department or urgent care.  ? ?If you have a non-emergency medical problem during routine business hours, please contact your provider's office and ask to speak with a nurse.  ? ?For questions related to your Healthy [facility] health plan, please call: [phone] or visit the homepage here: [email or web address] ? ?If you would like to schedule transportation through your Healthy [facility] plan, please call the following number at least 2 days in advance of your appointment: [phone] ? For information about your ride after you set it up, call Ride Assist at [phone]. Use this number to activate a Will Call pickup, or if your transportation is late for a scheduled pickup. Use this number, too, if you need to make a change or cancel a previously scheduled reservation. ? If you need transportation services right away, call [phone]. The after-hours call center is staffed 24 hours to handle ride assistance and urgent reservation requests (including discharges) 365 days a year. Urgent trips include sick visits, hospital discharge requests and life-sustaining treatment. ? ?Call the [place] [facility] at [phone], at any time, 24 hours a day, 7 days a week. If you are in danger or need immediate medical attention call 911. ? ?If you would like help to quit smoking, call 1-800-QUIT-NOW 531-440-1824) OR Espa?ol: 1-855-D?jelo-Ya 503-495-4015) o para m?s informaci?n haga clic aqu? or Text READY to 200-400 to register via text ? ?Jeffrey [name] - following are the goals we  discussed in your visit today:  ? Goals Addressed   ? ?  ?  ?  ?  ? This Visit's Progress  ?  Protect My Health     ?  Timeframe:  Long-Range Goal ?Priority:  High ?Start Date:    09/24/20                         ?Expected End Date:  ongoing          ? ?- schedule recommended health tests  ?- schedule and keep appointment for annual check-up  ?01/12/22:  Patient continues to follow with LVAD clinic   ? ?Patient verbalizes understanding of instructions and care plan provided today and agrees to view in [place]. Active MyChart status confirmed with patient.   ? ?The Managed Medicaid care management team will reach out to the patient again over the next 30 days.  ?The  Patient has been provided with contact information for the Managed Medicaid care management team and has been advised to call with any health related questions or concerns.  ? ?[name] RN, BSN ?[facility] ?Care Management Coordinator - Managed Medicaid High Risk ?[phone] ?  ?Following is a copy of your plan of care:  ?Care Plan : General Plan of Care (Adult)  ?Updates made by Gayla Medicus, RN since 01/12/2022 12:00 AM  ?  ? ?Problem: Health Promotion or Disease Self-Management (General Plan of Care)   ?Priority: High  ?Onset Date: 09/24/2020  ?Note:   ?Current Barriers:  ?Chronic Disease Management support and education needs, S/P LVAD 07/21/21 ?01/12/22:  Patient with no food stamp or  disability determination yet-states he will follow up with DSS.  Has received dental resources-has not chosen a dentist yet-is working on getting an appt.  No complaints today-Zoloft has helped depression symptoms and plans on attending support group.  Skin infection is healing ? ?Nurse Case Manager Clinical Goal(s):  ?Over the next 90 days, patient will attend all scheduled medical appointments: ?Over the next 30 days, patient wil attend HF and LVAD support group ?Over the next 30 days, patient will have dental appt  scheduled ? ?Interventions:  ?Inter-disciplinary care team collaboration (see longitudinal plan of care) ?Collaborated with BSW regarding food stamps and dental resources. ?BSW referral for food stamp updates and dental resources-completed ?Evaluation of current treatment plan and patient's adherence to plan as established by provider.  ?BSW contacted patient regarding daycare voucher and foodstamps. BSW informed patient he would have to contact DSS and have the child's name placed on the waitlist, patient can also apply for foodstamps at the same time. Patient reports no other resources needed at this time.  ?09/04/21: BSW spoke with patient, he stated he is not sure if they have been placed on the daycare voucher because his wife has not said anything to him yet. Patient stated they have applied for foodstamps but have not received them, he thinks its because his wife makes to much money. No other resources are needed at this time. ?10/22/21: Bsw completed a follow up telephone call with patient regarding dental resources and follow up with his foodstamps. Patient stated he would like for dental resources to be mailed to him. Patient asked BSW if she could provide an update on his foodstamp application, BSW informed patient he would have to contact DSS call center or his worker to get an update on his foodstamp application. No other resources are needed at this time. ?12/15/21: BSW completed telephone outreach with patient. He states he did not receieve the resources for dental. BSW will resend letter. BSW informed patient it may benefit him to going up to DSS to inquire about his foodstamp application. Sometimes it is easier to go up there. Patient stated he was going to try the group session for now and is not interested in therapy, BSW did provide him with the information for [facility].No other resources are needed at this time. ?01/02/22: BSW completed telephone outreach with patient. He states he did receive the  letter with the resources and is waiting on call backs. Patient states he has not made it down to DSS yet to check on his foodstamp application. No other resources are needed at this time.  ?Reviewed medications with patient. ?Discussed plans with patient for ongoing care management follow up and provided patient with direct contact information for care management team ?Provided patient with educational materials related to exercise. ?Reviewed scheduled/upcoming provider appointments. ?Collaborated with Pharmacy ?Pharmacy referral for medication review ? ?Patient Goals/Self-Care Activities ?Over the next 90 days, patient will: ? -Attends all scheduled provider appointments ?Calls provider office for new concerns or questions ? ?Follow Up Plan: The Managed Medicaid care management team will reach out to the patient again over the next 30 days.  ?The patient has been provided with contact information for the Managed Medicaid care management team and has been advised to call with any health related questions or concerns.   ? ?  ?

## 2022-01-12 NOTE — Patient Outreach (Signed)
?Medicaid Managed Care   ?Nurse Care Manager Note ? ?01/12/2022 ?Name:  Jeffrey [name] MRN:  [id number] DOB:  May 05, 1976 ? ?Jeffrey [name] is an 46 y.o. year old male who is a primary patient of Passmore, Jeffrey Church I, NP.  The [facility] Managed Care Coordination team was consulted for assistance with:    ?Chronic healthcare management needs, DM, HTN, OSA, CHF, Afib, CKD, gout ? ?Mr. Face was given information about Medicaid Managed Care Coordination team services today. Jeffrey [name] Patient agreed to services and verbal consent obtained. ? ?Engaged with patient by telephone for follow up visit in response to provider referral for case management and/or care coordination services.  ? ?Assessments/Interventions:  Review of past medical history, allergies, medications, health status, including review of consultants reports, laboratory and other test data, was performed as part of comprehensive evaluation and provision of chronic care management services. ? ?SDOH (Social Determinants of Health) assessments and interventions performed: ?SDOH Interventions   ? ?Flowsheet Row Most Recent Value  ?SDOH Interventions   ?Food Insecurity Interventions Other (Comment)  [applied for food stamps-has not heard-is going to DSS office to follow up]  ?Housing Interventions Intervention Not Indicated  ? ?  ?Care Plan ? ?Allergies  ?Allergen Reactions  ? Aspirin Shortness Of Breath, Itching and Rash  ?   Burning sensation (Patient reports he tolerates other NSAIDS)   ? Bee Venom Hives and Swelling  ? Lisinopril Cough  ? Shrimp (Diagnostic) Rash  ? Tomato Rash  ? ? ?Medications Reviewed Today   ? ? Reviewed by Gayla Medicus, RN (Registered Nurse) on 01/12/22 at [phone]  Med List Status: <None>  ? ?Medication Order Taking? Sig Documenting Provider Last Dose Status Informant  ?Accu-Chek FastClix Lancets MISC [id number] No USE AS DIRECTED FOUR TIMES DAILY Vevelyn Francois, NP Taking Active   ?acetaminophen (TYLENOL) 325 MG tablet [id number] No Take 2  tablets (650 mg total) by mouth every 6 (six) hours as needed for fever. Darrick Grinder D, NP Taking Active Self  ?allopurinol (ZYLOPRIM) 100 MG tablet [id number] No Take 1 tablet (100 mg total) by mouth daily. Cathlyn Parsons, PA-C Taking Active Self  ?amiodarone (PACERONE) 200 MG tablet [id number] No Take 1 tablet (200 mg total) by mouth daily. Darrick Grinder D, NP Taking Active   ?amoxicillin-clavulanate (AUGMENTIN) 875-125 MG tablet [id number]  Take 1 tablet by mouth 2 (two) times daily. [place] Callas, NP  Active   ?colchicine 0.6 MG tablet [id number] No Take 1 tablet (0.6 mg total) by mouth daily. Darrick Grinder D, NP Taking Active   ?diclofenac Sodium (VOLTAREN) 1 % GEL [id number] No Apply 2 g topically 4 (four) times daily. Izora Ribas, MD Taking Active   ?docusate sodium (COLACE) 100 MG capsule [id number] No Take 1 capsule (100 mg total) by mouth 2 (two) times daily.  ?Patient not taking: Reported on 09/09/2021  ? Darrick Grinder D, NP Not Taking Active Self  ?enoxaparin (LOVENOX) 60 MG/0.6ML injection [id number] No Inject 0.6 mLs (60 mg total) into the skin every 12 (twelve) hours.  ?Patient not taking: Reported on 12/22/2021  ? Bensimhon, Shaune Pascal, MD Not Taking Active   ?gabapentin (NEURONTIN) 300 MG capsule [id number] No Take 1 capsule (300 mg total) by mouth 3 (three) times daily. Bensimhon, Shaune Pascal, MD Taking Active   ?glucose blood test strip [id number] No Use as directed 4 times daily. Bo Merino I, NP Taking Active   ?insulin aspart protamine - aspart (NOVOLOG 70/30 MIX) (70-30) 100  UNIT/ML FlexPen [id number] No Inject 38 Units into the skin 2 (two) times daily. [place], Maricela Bo, FNP Taking Active Self  ?Insulin Pen Needle (TRUEPLUS 5-BEVEL PEN NEEDLES) 29G X 12.7MM MISC [id number] No Use as directed in the morning, at noon, and at bedtime. Teena Dunk, NP Taking Active   ?Insulin Syringe-Needle U-100 (TRUEPLUS INSULIN SYRINGE) 31G X 5/16" 0.5 ML MISC [id number] No USE TO INJECT INSULIN TWICE DAILY.  Bo Merino I, NP Taking Active   ?isosorbide-hydrALAZINE (BIDIL) 20-37.5 MG tablet [id number] No Take 2 tablets by mouth 3 (three) times daily. Bensimhon, Shaune Pascal, MD Taking Active   ?meclizine (ANTIVERT) 25 MG tablet [id number]  Take 1 tablet (25 mg total) by mouth 3 (three) times daily as needed for dizziness. Bensimhon, Shaune Pascal, MD  Active   ?metFORMIN (GLUCOPHAGE) 1000 MG tablet [id number] No Take 1 tablet (1,000 mg total) by mouth 2 (two) times daily with a meal. [place], Maricela Bo, FNP Taking Active Self  ?pantoprazole (PROTONIX) 40 MG tablet [id number]  Take 1 tablet (40 mg total) by mouth daily. Bensimhon, Shaune Pascal, MD  Active   ?sacubitril-valsartan (ENTRESTO) 49-51 MG [id number] No Take 1 tablet by mouth 2 (two) times daily. Clegg, Amy D, NP Taking Active   ?sertraline (ZOLOFT) 100 MG tablet [id number] No Take 1.5 tablets (150 mg total) by mouth daily. Bensimhon, Shaune Pascal, MD Taking Active   ?spironolactone (ALDACTONE) 25 MG tablet [id number] No Take 1 tablet (25 mg total) by mouth daily. Cathlyn Parsons, PA-C Taking Active Self  ?traMADol (ULTRAM) 50 MG tablet [id number]  Take 1-2 tablets (50-100 mg total) by mouth 2 (two) times daily. Bensimhon, Shaune Pascal, MD  Active   ?traZODone (DESYREL) 100 MG tablet [id number] No Take 1 tablet (100 mg total) by mouth at bedtime. Bo Merino I, NP Taking Active   ?warfarin (COUMADIN) 4 MG tablet [id number] No Take 8 mg (2 tablets) every Monday/Wednesday/Friday and 4 mg (1 tablet) all other days or as directed by HF clinic Bensimhon, Shaune Pascal, MD Taking Active   ?         ?Med Note Orma Render   Mon Dec 22, 2021 11:24 AM) 4 mg every Tuesday/Saturday and 8 mg all other days  ? ?  ?  ? ?  ? ?Patient Active Problem List  ? Diagnosis Date Noted  ? Presence of left ventricular assist device (LVAD) ([place]) 07/25/2021  ? Acute on chronic systolic CHF (congestive heart failure) ([place]) 07/18/2021  ? Cardiogenic shock ([place])   ? Atrial fibrillation with RVR ([place]) 04/30/2021   ? Acute hypoxemic respiratory failure ([place])   ? Right wrist pain 04/16/2021  ? Absolute anemia   ? Rectal bleeding   ? Iron deficiency 12/25/2020  ? Long term current use of anticoagulant - apixaban 12/25/2020  ? Paroxysmal atrial fibrillation (HCC)   ? Lactic acidosis 11/22/2020  ? Hypotension 11/22/2020  ? Aspiration into airway   ? ICD (implantable cardioverter-defibrillator) battery depletion 05/15/2020  ? Endotracheal tube present   ? Respiratory failure ([place])   ? Acute encephalopathy   ? Acute pulmonary edema (HCC)   ? NICM (nonischemic cardiomyopathy) ([place]) 04/24/2020  ? S/P vasectomy 12/20/2019  ? Anxiety 12/20/2019  ? Hemoglobin A1C between 7% and 9% indicating borderline diabetic control ([place]) 12/20/2019  ? Seasonal allergies 12/20/2019  ? Insomnia 12/20/2019  ? Vasectomy evaluation 06/20/2019  ? Visual problems 06/20/2019  ? Blurry vision, bilateral 06/20/2019  ? Hyperglycemia 02/13/2019  ? Hyperosmolar (nonketotic) coma ([place]) 01/19/2019  ?  AICD (automatic cardioverter/defibrillator) present 01/19/2019  ? Hyperlipidemia 12/07/2018  ? Follow-up exam 11/22/2018  ? Class 3 severe obesity due to excess calories with serious comorbidity and body mass index (BMI) of 45.0 to 49.9 in adult [facility]) 11/22/2018  ? Dizziness 11/22/2018  ? Lingular pneumonia 08/04/2018  ? Ventricular tachycardia ([place]) 12/30/2017  ? PAF (paroxysmal atrial fibrillation) ([place])   ? Dilated cardiomyopathy ([place])   ? Pollen allergy 02/17/2017  ? Dyslipidemia 02/17/2017  ? Hematochezia 11/08/2015  ? CKD (chronic kidney disease), stage III ([place]) 06/20/2015  ? Cardiac defibrillator in situ   ? Chronic systolic CHF (congestive heart failure) ([place]) 03/11/2015  ? Gouty arthritis 03/11/2015  ? Chest pain 12/28/2014  ? Acute renal failure superimposed on stage 3 chronic kidney disease ([place]) 04/08/2014  ? Aspirin allergy 04/08/2014  ? Renal cell carcinoma ([place])   ? Gout attack 11/10/2011  ? Morbid obesity ([place]) 10/23/2011  ? OSA (obstructive sleep  apnea) 10/23/2011  ? Type 2 diabetes mellitus with stage 3 chronic kidney disease, with long-term current use of insulin ([place]) 10/22/2011  ? Hyperlipidemia associated with type 2 diabetes mellitus ([place]) 03

## 2022-01-20 ENCOUNTER — Other Ambulatory Visit: Payer: Self-pay

## 2022-01-20 ENCOUNTER — Other Ambulatory Visit: Payer: Self-pay | Admitting: Nurse Practitioner

## 2022-01-20 MED ORDER — TRAZODONE HCL 100 MG PO TABS
100.0000 mg | ORAL_TABLET | Freq: Every day | ORAL | 0 refills | Status: DC
  Filled 2022-01-20 – 2022-01-28 (×2): qty 30, 30d supply, fill #0

## 2022-01-26 ENCOUNTER — Other Ambulatory Visit: Payer: Self-pay

## 2022-01-27 ENCOUNTER — Other Ambulatory Visit: Payer: Self-pay

## 2022-01-27 ENCOUNTER — Other Ambulatory Visit (HOSPITAL_COMMUNITY): Payer: Self-pay | Admitting: Unknown Physician Specialty

## 2022-01-27 DIAGNOSIS — Z95811 Presence of heart assist device: Secondary | ICD-10-CM

## 2022-01-27 DIAGNOSIS — Z7901 Long term (current) use of anticoagulants: Secondary | ICD-10-CM

## 2022-01-28 ENCOUNTER — Ambulatory Visit (HOSPITAL_COMMUNITY)
Admission: RE | Admit: 2022-01-28 | Discharge: 2022-01-28 | Disposition: A | Discharge status: Home / Self Care | Payer: Medicaid Other | Source: Ambulatory Visit | Attending: Internal Medicine | Admitting: Internal Medicine

## 2022-01-28 ENCOUNTER — Other Ambulatory Visit: Payer: Self-pay

## 2022-01-28 ENCOUNTER — Ambulatory Visit (HOSPITAL_COMMUNITY): Payer: Self-pay | Admitting: Pharmacist

## 2022-01-28 VITALS — BP 160/0 | HR 75 | Ht 67.0 in | Wt 311.8 lb

## 2022-01-28 DIAGNOSIS — I480 Paroxysmal atrial fibrillation: Secondary | ICD-10-CM

## 2022-01-28 DIAGNOSIS — I5022 Chronic systolic (congestive) heart failure: Secondary | ICD-10-CM

## 2022-01-28 DIAGNOSIS — Z95811 Presence of heart assist device: Secondary | ICD-10-CM | POA: Insufficient documentation

## 2022-01-28 DIAGNOSIS — T827XXA Infection and inflammatory reaction due to other cardiac and vascular devices, implants and grafts, initial encounter: Secondary | ICD-10-CM

## 2022-01-28 DIAGNOSIS — Z7901 Long term (current) use of anticoagulants: Secondary | ICD-10-CM | POA: Diagnosis not present

## 2022-01-28 DIAGNOSIS — Z4801 Encounter for change or removal of surgical wound dressing: Secondary | ICD-10-CM | POA: Insufficient documentation

## 2022-01-28 DIAGNOSIS — I10 Essential (primary) hypertension: Secondary | ICD-10-CM | POA: Diagnosis not present

## 2022-01-28 LAB — CBC
HCT: 45.3 % (ref 39.0–52.0)
Hemoglobin: 15 g/dL (ref 13.0–17.0)
MCH: 28.8 pg (ref 26.0–34.0)
MCHC: 33.1 g/dL (ref 30.0–36.0)
MCV: 87.1 fL (ref 80.0–100.0)
Platelets: 193 10*3/uL (ref 150–400)
RBC: 5.2 MIL/uL (ref 4.22–5.81)
RDW: 17.2 % — ABNORMAL HIGH (ref 11.5–15.5)
WBC: 5.7 10*3/uL (ref 4.0–10.5)
nRBC: 0 % (ref 0.0–0.2)

## 2022-01-28 LAB — COMPREHENSIVE METABOLIC PANEL
ALT: 18 U/L (ref 0–44)
AST: 20 U/L (ref 15–41)
Albumin: 3.6 g/dL (ref 3.5–5.0)
Alkaline Phosphatase: 54 U/L (ref 38–126)
Anion gap: 5 (ref 5–15)
BUN: 19 mg/dL (ref 6–20)
CO2: 26 mmol/L (ref 22–32)
Calcium: 9.2 mg/dL (ref 8.9–10.3)
Chloride: 108 mmol/L (ref 98–111)
Creatinine, Ser: 1.39 mg/dL — ABNORMAL HIGH (ref 0.61–1.24)
GFR, Estimated: >60 mL/min (ref >60)
Glucose, Bld: 107 mg/dL — ABNORMAL HIGH (ref 70–99)
Potassium: 3.8 mmol/L (ref 3.5–5.1)
Sodium: 139 mmol/L (ref 135–145)
Total Bilirubin: 0.7 mg/dL (ref 0.3–1.2)
Total Protein: 7.4 g/dL (ref 6.5–8.1)

## 2022-01-28 LAB — LACTATE DEHYDROGENASE: LDH: 199 U/L — ABNORMAL HIGH (ref 98–192)

## 2022-01-28 LAB — T4, FREE: Free T4: 1.03 ng/dL (ref 0.61–1.12)

## 2022-01-28 LAB — PROTIME-INR
INR: 2.1 — ABNORMAL HIGH (ref 0.8–1.2)
Prothrombin Time: 22.9 seconds — ABNORMAL HIGH (ref 11.4–15.2)

## 2022-01-28 LAB — PREALBUMIN: Prealbumin: 27 mg/dL (ref 18–38)

## 2022-01-28 LAB — TSH: TSH: 2.533 u[IU]/mL (ref 0.350–4.500)

## 2022-01-28 MED ORDER — SACUBITRIL-VALSARTAN 97-103 MG PO TABS
1.0000 | ORAL_TABLET | Freq: Two times a day (BID) | ORAL | 6 refills | Status: DC
  Filled 2022-01-28: qty 60, 30d supply, fill #0
  Filled 2022-03-20 – 2022-04-08 (×2): qty 60, 30d supply, fill #1
  Filled 2022-05-20: qty 60, 30d supply, fill #2
  Filled 2022-07-02: qty 60, 30d supply, fill #3

## 2022-01-28 NOTE — Progress Notes (Signed)
Patient presents for 1 mo follow up in [facility] today alone. Reports no problem with VAD or equipment. ? ?Patient walked in clinic without assistance. Denies syncopal episodes, falls, or signs of bleeding. Reports continued mild lightheadedness/dizziness intermittently and states that this happens when he is just sitting on the couch. Pt is taking meclizine as prescribed and states this helps. ? ?BP elevated today - patient has not taken any medications today. He does confirm he is Bidil 2 tabs bid, Entresto 49-51 bid and Spiro 25 mg daily. We will increase Entresto today, and see him Monday for BP check. If BP better we can make him a 1 mo f/u per DB. ? ?Pt confirms he increased his Zoloft to 150 mg daily as instructed last visit and reports depression symptoms are "better". He is aware he can reach out to Raquel Sarna, LCSW if he needs any further assistance including counseling. He says he just doesn't feel he needs it right now. ? ?Pt continues to c/o his VAD pump beeping inside his chest. I cannot hear this beeping. I auscultated pts pump and then allowed him to use my stethoscope to listen. Pt confirms this is the "beeping" he is hearing. Pt informed that this is the pump running and every 2 seconds the pump performs a "fake pulse" or "washout." Pt informed that we cannot do anything about this noise he is hears as it is his pump. Pt is amused by this and is glad to find out what he has been hearing. ? ?Patient confirms his DL dressing was changed Monday. See full note below with pics. Pt instructed to advance to twice weekly dressing changes using the daily kit. Pt is taking Augmentin twice daily and tells me he has a couple days left. ? ?Pt is working to lose weight. Pt tells me that he is walking daily. We will refer him to Weight loss management pharmacy clinic for DM 2 injection. ? ?Vital Signs:  ?Doppler Pressure: 160  ?Automatc BP: 152/105 (128) ?HR: 75 ?SPO2: 97% on RA ?  ?Weight: 311.8 lb w/  eqt ?Last weight:  313.8 lbs w/equt ? ? ?VAD Indication: ?Destination Therapy due to smoking and BMI ?  ?VAD interrogation & Equipment Management: ?Speed: 6200 ?Flow: 4.9 ?Power: 5.4 w    ?PI: 5.5 ?Hct:  41 ?  ?Alarms: no clinical alarms ?Events: 10-25 daily ? ?Fixed speed 6200 ?Low speed limit: 5900 ?  ?Primary Controller:  Replace back up battery in 24 months. ?Back up controller:   Replace back up battery in 27 months. ?  ?Annual Equipment Maintenance on UBC/PM was performed on 07/21/21.  ?  ?I reviewed the LVAD parameters from today and compared the results to the patient's prior recorded data. LVAD interrogation was NEGATIVE for significant power changes, NEGATIVE for clinical alarms and POSITIVE for PI events/speed drops. No programming changes were made and pump is functioning within specified parameters. Pt is performing daily controller and system monitor self tests along with completing weekly and monthly maintenance for LVAD equipment. ?  ?LVAD equipment check completed and is in good working order. Back-up equipment present.  ?  ?Exit Site Care: ?Drive line is being maintained daily by [name] his wife. Drive line anchor is unattached.  Existing gauze VAD dressing removed and site care performed using sterile technique. Drive line exit site cleaned with Chlora prep applicators x 2, allowed to dry, and gauze dressing with silver strip applied. Exit site healed and incorporated, the velour is fully implanted  at exit site. No redness, tenderness or rash. Exit site with scant amount of yellow drainage with no foul odor. Advance to twice weekly dressing changes using silver strip around exit site; provided with 10 anchors. Pt denies fever or chills.  ? ? ? ? ?Device: BS ?Therapies: on at 250 BPM ?Last check: 12/13/21 ? ? ?BP & Labs:  ?Doppler 160 - Doppler is reflecting Modified systolic ?  ?Hgb 15.0 - No S/S of bleeding. Specifically denies melena/BRBPR or nosebleeds. ?  ?LDH stable at 199 with established  baseline of 160 - 300. Denies tea-colored urine. No power elevations noted on interrogation.  ? ?6 mo. Intermacs follow up completed including:  Quality of Life, KCCQ-12, and Neurocognitive trail making.  ? ?Pt completed 1200 feet during 6 minute walk. ? ?Back up controller: ? ?11V backup battery charged during this visit. ? ?[facility] Cardiomyopathy Questionnaire  ? ?  01/28/2022  ? 10:41 AM 10/23/2021  ?  1:38 PM  ?KCCQ-12  ?1 a. Ability to shower/bathe Not at all limited Extremely limited  ?1 b. Ability to walk 1 block Slightly limited Slightly limited  ?1 c. Ability to hurry/jog Quite a bit limited Quite a bit limited  ?2. Edema feet/ankles/legs Never over the past 2 weeks Never over the past 2 weeks  ?3. Limited by fatigue 1-2 times a week 3+ times per week, not every day  ?4. Limited by dyspnea 1-2 times a week Less than once a week  ?5. Sitting up / on 3+ pillows Never over the past 2 weeks Never over the past 2 weeks  ?6. Limited enjoyment of life Slightly limited Moderately limited  ?7. Rest of life w/ symptoms Somewhat satisfied Mostly satisfied  ?8 a. Participation in hobbies Limited quite a bit Limited quite a bit  ?8 b. Participation in chores Slightly limited Limited quite a bit  ?8 c. Visiting family/friends Slightly limited Severely limited  ?  ? ? ?Patient Goals: ?Pt wants to lose at least 100lbs ? ?Plan:  ?Increase Entresto to 97-103 twice a day ?Lauren (PharmD) will call you with INR results and warfarin dosing.  ?Return to clinic on Monday for a BP check. TAKE ALL YOUR MEDICATIONS PRIOR TO COMING TO CLINIC ON Monday. ? ? ?Tanda Rockers RN ?VAD Coordinator  ?Office: [phone]  ?24/7 Pager: [phone]  ? ? ?

## 2022-01-28 NOTE — Progress Notes (Signed)
LVAD INR 

## 2022-01-28 NOTE — Patient Instructions (Addendum)
Increase Entresto to 97-103 twice a day ?Lauren (PharmD) will call you with INR results and warfarin dosing.  ?Return to clinic on Monday for a BP check. TAKE ALL YOUR MEDICATIONS PRIOR TO COMING TO CLINIC ON Monday. ?

## 2022-01-29 ENCOUNTER — Other Ambulatory Visit: Payer: Self-pay

## 2022-01-29 LAB — T3, FREE: T3, Free: 2.9 pg/mL (ref 2.0–4.4)

## 2022-01-30 ENCOUNTER — Telehealth: Payer: Self-pay | Admitting: Pharmacist

## 2022-01-30 NOTE — Telephone Encounter (Addendum)
Pt referred by Dr [name] to initiate [id number] therapy for weight loss. 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 ? ?Pt has Medicaid insurance which does not cover any weight loss medications like Wegovy or Saxenda.  He does have diabetes and is currently taking metformin and Novolog, should qualify for Ozempic coverage. Most recent A1c well controlled at 5.9%, starting GLP would allow pt to reduce insulin which would further help with weight loss as well. Ozempic available on his formulary. ? ?Note from PharmD at [facility] from 09/12/21 states "Collaborated with PCP to recommend GLP1 (Ozempic or Trulicity) and reduction in patient's Novolog dose" but I do not see any follow up documentation regarding this and pt was not started on a GLP1RA. Will reach out to PCP office who has been managing pt's DM to confirm there were no issues with this plan and see if they prefer to start pt on Ozempic or if they wish for cardiology to. Subsequent insulin adjustments would be deferred to PCP so may be easier for them to coordinate. ?

## 2022-02-01 NOTE — Progress Notes (Signed)
?    ?VAD CLINIC Follow-up Visit ? ?PCP: Teena Dunk, NP ?HF MD: DB ? ? ?HPI: ? ?Jeffrey [name] is a 46 y.o. male with a history of  poorly controlled HTN, R renal cell carcinoma s/p nephrectomy, CKD IIIa, DM2, OSA, gout, morbid obesity and systolic HF due to NICM. S/p HM-3 VAD placement 07/21/21 ? ?Has [place]-sci S-ICD ?  ?HF dates back to 2012 with EF 25% ?  ?Admitted 7/22 with CHF/cardiogenic shock in setting AF with RVR. He was placed on amio gtt and underwent cardioversion. Echo EF < 20%, severe LV dilation, moderate RV dysfunction. Creatinine peaked at 4.65 improved to 1.58 which is his baselin ? Underwent HM3 LVAD by Dr. Cyndia Bent on 07/21/21. Had post-op AF requiring DC-CV. ? ?Follow up for Heart Failure/LVAD: ? ?Here for routine VAD f/u. Says he is feeling good. Now walking with a friend several days per week. Feeling stronger. Only mild SOB. Also watching his diet and trying to lose weight. Denies orthopnea or PND. No fevers, chills or problems with driveline. No bleeding, melena or neuro symptoms. No VAD alarms. Taking all meds as prescribed.  ? ?  ?VAD Indication: ?Destination Therapy due to smoking and BMI ?  ?VAD interrogation & Equipment Management: ?Speed: 6200 ?Flow: 4.9 ?Power: 5.4 w    ?PI: 5.5 ?Hct:  41 ?  ?Alarms: no clinical alarms ?Events: 10-25 daily ? ?Fixed speed 6200 ?Low speed limit: 5900 ?  ?Primary Controller:  Replace back up battery in 24 months. ?Back up controller:   Replace back up battery in 27 months. ?  ?Annual Equipment Maintenance on UBC/PM was performed on 07/21/21.  ?  ?  ?Annual Equipment Maintenance on UBC/PM was performed on 07/21/21.  ?  ? ? ?Past Medical History:  ?Diagnosis Date  ? Anxiety   ? Aspirin allergy   ? Childhood asthma   ? Chronic systolic CHF (congestive heart failure) ([place])   ? a. EF 20-25% in 2012. b. EF 45-50% in 10/2011 with nonischemic nuc - presumed NICM. c. 12/2014 Echo: Sev depressed LV fxn, sev dil LV, mild LVH, mild MR, sev dil LA, mildly reduced  RV fxn.  ? CKD (chronic kidney disease) stage 2, GFR 60-89 ml/min   ? H/O vasectomy 12/2019  ? High cholesterol   ? Hypertension   ? Morbid obesity ([place])   ? Nephrolithiasis   ? OSA on CPAP   ? Paroxysmal atrial fibrillation (HCC)   ? Presumed NICM   ? a. 04/2014 Myoview: EF 26%, glob HK, sev glob HK, ? prior infarct;  b. Never cathed 2/2 CKD.  ? Renal cell carcinoma ([place])   ? a. s/p Rt robotic assisted partial converted to radical nephrectomy on 01/2013.  ? Troponin level elevated   ? a. 04/2014, 12/2014: felt due to CHF.  ? Type II diabetes mellitus ([place])   ? Ventricular tachycardia   ? a. appropriate ICD therapy 12/2017  ? ? ?Current Outpatient Medications  ?Medication Sig Dispense Refill  ? Accu-Chek FastClix Lancets MISC USE AS DIRECTED FOUR TIMES DAILY 102 each 11  ? allopurinol (ZYLOPRIM) 100 MG tablet Take 1 tablet (100 mg total) by mouth daily. 90 tablet 3  ? amiodarone (PACERONE) 200 MG tablet Take 1 tablet (200 mg total) by mouth daily. 30 tablet 6  ? amoxicillin-clavulanate (AUGMENTIN) 875-125 MG tablet Take 1 tablet by mouth 2 (two) times daily. 60 tablet 0  ? colchicine 0.6 MG tablet Take 1 tablet (0.6 mg total) by mouth  daily. 30 tablet 6  ? diclofenac Sodium (VOLTAREN) 1 % GEL Apply 2 g topically 4 (four) times daily. 100 g 1  ? gabapentin (NEURONTIN) 300 MG capsule Take 1 capsule (300 mg total) by mouth 3 (three) times daily. 90 capsule 6  ? glucose blood test strip Use as directed 4 times daily. 100 each 11  ? insulin aspart protamine - aspart (NOVOLOG 70/30 MIX) (70-30) 100 UNIT/ML FlexPen Inject 38 Units into the skin 2 (two) times daily. 15 mL 11  ? Insulin Pen Needle (TRUEPLUS 5-BEVEL PEN NEEDLES) 29G X 12.7MM MISC Use as directed in the morning, at noon, and at bedtime. 100 each 3  ? Insulin Syringe-Needle U-100 (TRUEPLUS INSULIN SYRINGE) 31G X 5/16" 0.5 ML MISC USE TO INJECT INSULIN TWICE DAILY. 100 each 11  ? isosorbide-hydrALAZINE (BIDIL) 20-37.5 MG tablet Take 2 tablets by mouth 3 (three) times  daily. 180 tablet 6  ? meclizine (ANTIVERT) 25 MG tablet Take 1 tablet (25 mg total) by mouth 3 (three) times daily as needed for dizziness. 90 tablet 2  ? metFORMIN (GLUCOPHAGE) 1000 MG tablet Take 1 tablet (1,000 mg total) by mouth 2 (two) times daily with a meal. 60 tablet 11  ? pantoprazole (PROTONIX) 40 MG tablet Take 1 tablet (40 mg total) by mouth daily. 90 tablet 3  ? sacubitril-valsartan (ENTRESTO) 97-103 MG Take 1 tablet by mouth 2 (two) times daily. 60 tablet 6  ? sertraline (ZOLOFT) 100 MG tablet Take 1.5 tablets (150 mg total) by mouth daily. 45 tablet 6  ? spironolactone (ALDACTONE) 25 MG tablet Take 1 tablet (25 mg total) by mouth daily. 30 tablet 6  ? traZODone (DESYREL) 100 MG tablet Take 1 tablet (100 mg total) by mouth at bedtime. 30 tablet 0  ? warfarin (COUMADIN) 4 MG tablet Take 8 mg (2 tablets) every Monday/Wednesday/Friday and 4 mg (1 tablet) all other days or as directed by HF clinic 180 tablet 3  ? acetaminophen (TYLENOL) 325 MG tablet Take 2 tablets (650 mg total) by mouth every 6 (six) hours as needed for fever. (Patient not taking: Reported on 01/28/2022)    ? docusate sodium (COLACE) 100 MG capsule Take 1 capsule (100 mg total) by mouth 2 (two) times daily. (Patient not taking: Reported on 09/09/2021) 10 capsule 0  ? enoxaparin (LOVENOX) 60 MG/0.6ML injection Inject 0.6 mLs (60 mg total) into the skin every 12 (twelve) hours. (Patient not taking: Reported on 12/22/2021) 6 mL 0  ? traMADol (ULTRAM) 50 MG tablet Take 1-2 tablets (50-100 mg total) by mouth 2 (two) times daily. (Patient not taking: Reported on 01/28/2022) 60 tablet 5  ? ?No current facility-administered medications for this encounter.  ? ? ?Aspirin, Bee venom, Lisinopril, Shrimp (diagnostic), and Tomato ? ?REVIEW OF SYSTEMS: All systems negative except as listed in HPI, PMH and Problem list. ? ? ?Vitals:  ? 01/28/22 1258 01/28/22 1300  ?BP: (!) 152/105 (!) 160/0  ?Pulse: 75   ?SpO2: 97%   ?Weight: (!) 141.4 kg (311 lb 12.8 oz)    ?Height: '5\' 7"'$  (1.702 m)   ? ? ?  ?Vital Signs:  ?Doppler Pressure: 160             ?Automatc BP: 152/105 (128) ?HR: 75 ?SPO2: 97% on RA ?  ?Weight: 311.8 lb w/ eqt ?Last weight:  313.8 lbs w/equt ?  ?   ?General:  NAD.  ?HEENT: normal  ?Neck: supple. JVP not elevated.  Carotids 2+ bilat; no bruits. No lymphadenopathy or  thryomegaly appreciated. ?Cor: LVAD hum.  ?Lungs: Clear. ?Abdomen: obese soft, nontender, non-distended. No hepatosplenomegaly. No bruits or masses. Good bowel sounds. Driveline site clean. Anchor in place.  ?Extremities: no cyanosis, clubbing, rash. Warm no edema  ?Neuro: alert & oriented x 3. No focal deficits. Moves all 4 without problem  ? ? ? ?ASSESSMENT AND PLAN: ? ?.1. Chronic systolic CHF ?- Nonischemic CM, end-stage ?- EF 2012; 20-25%  ?- Echo  8/22: EF < 20%, severe LV dilation, moderate RV dysfunction.   ?- RHC (8/22): well compensated hemodynamics. ?- s/p S-ICD ?- S/p HM3 LVAD 10/17 (DT) ?- Stable NYHA II. Volume status looks good.  ?- MAPs up but didn't take meds this am again.  ?- Continue Bidil 2 tabs tid ?- Continue spironolactone 25 mg daily  ?- Increase entresto to 97/130 bid ?- Stressed need to continue weight loss efforts (see below). Once BMI ~ 35 will refer to [facility] for begin transplant evaluation (Body mass index is 48.83 kg/m?.) ?  ?2. VAD  ?- s/p HM-3 placement 07/21/21  ?- VAD interrogated personally. Parameters stable. ?- INR 2.1 Goal INR 2.0-3.0 Discussed dosing with PharmD personally. ?- LDH 199 ?- DL site looks good ?- Hgb 15.0 ? ?3. DL site infection ?- resolved ?- reinforced need to avoid DL trauma ? ?4 Atrial fibrillation: Paroxysmal ?- Had severe AF with RVR 7/22 s/p DC-CV x 2 ?- Seen by EP. Not a candidate for ablation due to body habitus  (could consider AVN ablation and CRT, if needed)  ?- Had post-op AF with RVR post VAD ?- 10/23 S/P DC-CV with restoration of NSR. ?- Remains in NSR Continue amio 100 daily ?- On warfarin. INR 2.1 ?  ?5. HTN ?- MAPs elevated today  in setting of not taking meds ?- stressed need for compliance ?- Increase Entresto as above ?- BP check on Monday ? ?6. CKD stage 3B  ?- Has solitary kidney.   ?- Baseline creatinine is 1.5-1.8.

## 2022-02-02 ENCOUNTER — Ambulatory Visit (HOSPITAL_COMMUNITY)
Admission: RE | Admit: 2022-02-02 | Discharge: 2022-02-02 | Disposition: A | Discharge status: Home / Self Care | Payer: Medicaid Other | Source: Ambulatory Visit | Attending: Cardiology | Admitting: Cardiology

## 2022-02-02 ENCOUNTER — Other Ambulatory Visit: Payer: Self-pay

## 2022-02-02 VITALS — BP 120/0

## 2022-02-02 DIAGNOSIS — Z95811 Presence of heart assist device: Secondary | ICD-10-CM

## 2022-02-02 DIAGNOSIS — Z01818 Encounter for other preprocedural examination: Secondary | ICD-10-CM | POA: Diagnosis not present

## 2022-02-02 MED ORDER — AMLODIPINE BESYLATE 10 MG PO TABS
10.0000 mg | ORAL_TABLET | Freq: Every day | ORAL | 6 refills | Status: DC
  Filled 2022-02-02: qty 30, 30d supply, fill #0
  Filled 2022-03-10: qty 30, 30d supply, fill #1
  Filled 2022-04-27: qty 30, 30d supply, fill #2

## 2022-02-02 NOTE — Progress Notes (Signed)
Pt presents to clinic today for BP check. Pt confirms that he has taken all BP meds this morning. Pt confirms that he increased his Entresto to 97-103. In addition, he is taking Bidil 20-37.5 2 tablets 3x a day and Spiro 25. ? ?Auto BP: 145/86 (122) ?Doppler: 120 ? ?D/w Dr [name] will start Amlodipine 10 mg daily and recheck his BP next week w/BMET, INR. Pt instructed to take all medications prior to coming to clinic next week. ? ?Tanda Rockers RN, BSN ?VAD Coordinator ?24/7 Pager [phone] ? ?

## 2022-02-03 NOTE — Telephone Encounter (Signed)
Called pt to discuss. Of note, the preferred home # listed is his wife's #, not his. Called pt on cell and left message. ?

## 2022-02-05 ENCOUNTER — Other Ambulatory Visit: Payer: Self-pay

## 2022-02-05 ENCOUNTER — Other Ambulatory Visit (HOSPITAL_COMMUNITY): Payer: Self-pay | Admitting: *Deleted

## 2022-02-05 DIAGNOSIS — Z7901 Long term (current) use of anticoagulants: Secondary | ICD-10-CM

## 2022-02-05 DIAGNOSIS — Z95811 Presence of heart assist device: Secondary | ICD-10-CM

## 2022-02-05 MED ORDER — OZEMPIC (0.25 OR 0.5 MG/DOSE) 2 MG/3ML ~~LOC~~ SOPN
PEN_INJECTOR | SUBCUTANEOUS | 1 refills | Status: DC
  Filled 2022-02-05: qty 3, 42d supply, fill #0
  Filled 2022-03-20: qty 3, 42d supply, fill #1

## 2022-02-05 NOTE — Telephone Encounter (Signed)
2nd message left for pt.

## 2022-02-05 NOTE — Telephone Encounter (Signed)
Pt returned call to clinic. He is interested in taking Ozempic. No contraindications noted in his chart. Rx sent to his pharmacy, pt aware to store med in the fridge and bring to his appt with PharmD scheduled on 5/25. ?

## 2022-02-09 ENCOUNTER — Encounter (HOSPITAL_COMMUNITY): Payer: Self-pay

## 2022-02-09 ENCOUNTER — Other Ambulatory Visit: Payer: Self-pay

## 2022-02-09 ENCOUNTER — Ambulatory Visit (HOSPITAL_COMMUNITY)
Admission: RE | Admit: 2022-02-09 | Discharge: 2022-02-09 | Disposition: A | Discharge status: Home / Self Care | Payer: Medicaid Other | Source: Ambulatory Visit | Attending: Internal Medicine | Admitting: Internal Medicine

## 2022-02-09 VITALS — BP 130/77 | HR 87

## 2022-02-09 DIAGNOSIS — I5022 Chronic systolic (congestive) heart failure: Secondary | ICD-10-CM

## 2022-02-09 DIAGNOSIS — Z7901 Long term (current) use of anticoagulants: Secondary | ICD-10-CM | POA: Insufficient documentation

## 2022-02-09 DIAGNOSIS — Z95811 Presence of heart assist device: Secondary | ICD-10-CM | POA: Insufficient documentation

## 2022-02-09 DIAGNOSIS — E1159 Type 2 diabetes mellitus with other circulatory complications: Secondary | ICD-10-CM

## 2022-02-09 LAB — BASIC METABOLIC PANEL
Anion gap: 7 (ref 5–15)
BUN: 28 mg/dL — ABNORMAL HIGH (ref 6–20)
CO2: 22 mmol/L (ref 22–32)
Calcium: 9.4 mg/dL (ref 8.9–10.3)
Chloride: 108 mmol/L (ref 98–111)
Creatinine, Ser: 1.56 mg/dL — ABNORMAL HIGH (ref 0.61–1.24)
GFR, Estimated: 55 mL/min — ABNORMAL LOW (ref >60)
Glucose, Bld: 133 mg/dL — ABNORMAL HIGH (ref 70–99)
Potassium: 4.6 mmol/L (ref 3.5–5.1)
Sodium: 137 mmol/L (ref 135–145)

## 2022-02-09 LAB — PROTIME-INR
INR: 2.1 — ABNORMAL HIGH (ref 0.8–1.2)
Prothrombin Time: 23.3 seconds — ABNORMAL HIGH (ref 11.4–15.2)

## 2022-02-09 MED ORDER — GABAPENTIN 300 MG PO CAPS
300.0000 mg | ORAL_CAPSULE | Freq: Three times a day (TID) | ORAL | 6 refills | Status: DC
  Filled 2022-02-09: qty 90, 30d supply, fill #0

## 2022-02-09 MED ORDER — DOXAZOSIN MESYLATE 1 MG PO TABS
1.0000 mg | ORAL_TABLET | Freq: Every day | ORAL | 6 refills | Status: DC
  Filled 2022-02-09: qty 30, 30d supply, fill #0
  Filled 2022-03-20: qty 30, 30d supply, fill #1
  Filled 2022-04-27: qty 30, 30d supply, fill #2
  Filled 2022-07-02: qty 30, 30d supply, fill #3

## 2022-02-09 NOTE — Patient Instructions (Signed)
Start Doxazosin 1 mg daily  ?Coumadin dosing per Ander Purpura PharmD ?Return to [place] clinic in 1 week for BP check and labs ?

## 2022-02-09 NOTE — Addendum Note (Signed)
Encounter addended by: Mertha Baars, RN on: 02/09/2022 1:32 PM ? Actions taken: Vitals modified, Order Reconciliation Section accessed, Clinical Note Signed

## 2022-02-09 NOTE — Addendum Note (Signed)
Encounter addended by: Mertha Baars, RN on: 02/09/2022 11:50 AM ? Actions taken: Vitals modified, Order Reconciliation Section accessed, Medication List reviewed, Home Medications modified, Medication taking status modified, Visit diagnoses modified, Clinical Note Signed, Pharmacy for encounter modified, Order list changed, Diagnosis association updated

## 2022-02-09 NOTE — Progress Notes (Addendum)
Pt presents to clinic today for BP check and labs. Pt confirms that he has taken all BP meds this morning. Pt confirms that he started Amlodipine 10 mg as ordered last visit. He is also taking Entresto to 97-103 mg BID, Bidil 20-37.5 2 tablets TID and Spiro 25 mg daily. ? ?Auto BP: 130/77 (108) ?Doppler: 130 ?HR: 87 SR ?O2 Sat: 96%  ? ?D/w Dr [name] will start Doxazosin 1 mg daily and recheck his BP next week w/BMET, INR. Prescription sent in to patient's pharmacy. Pt instructed to take all medications prior to coming to clinic next week. He verbalized understanding. ? ?Gabapentin refill sent to pt's pharmacy.  ? ?Reports he will be starting Ozempic injections next week. He went to the gym on Saturday, and [place] 3 hours on [place]. He is looking forward to getting back into an exercise routine and restarting his weight loss journey.  ? ?Lab lost INR tube. Will check next week.  ? ?Plan:  ?Start Doxazosin 1 mg daily  ?Coumadin dosing per Ander Purpura PharmD ?Return to [place] clinic in 1 week for BP check and labs ? ?[name] RN ?VAD Coordinator  ?Office: [phone]  ?24/7 Pager: [phone]  ? ? ?

## 2022-02-12 ENCOUNTER — Other Ambulatory Visit (HOSPITAL_COMMUNITY): Payer: Self-pay | Admitting: *Deleted

## 2022-02-12 DIAGNOSIS — Z7901 Long term (current) use of anticoagulants: Secondary | ICD-10-CM

## 2022-02-12 DIAGNOSIS — Z95811 Presence of heart assist device: Secondary | ICD-10-CM

## 2022-02-13 ENCOUNTER — Other Ambulatory Visit: Payer: Self-pay | Admitting: Obstetrics and Gynecology

## 2022-02-13 NOTE — Patient Outreach (Signed)
Care Coordination ? ?02/13/2022 ? ?Jeffrey [name] ?Oct 29, 1975 ?[id number] ? ?RNCM called patient at scheduled time.  Patient answered phone and stated he was out shopping for Mother's Day.  Patient requested to reschedule appointment.  RNCM rescheduled appointment. ? ?[name] RN, BSN ?[facility] ?Care Management Coordinator - Managed Medicaid High Risk ?[phone]  ? ? ?

## 2022-02-16 ENCOUNTER — Other Ambulatory Visit (HOSPITAL_COMMUNITY): Payer: Medicaid Other

## 2022-02-16 ENCOUNTER — Other Ambulatory Visit: Payer: Self-pay

## 2022-02-17 ENCOUNTER — Ambulatory Visit (HOSPITAL_COMMUNITY): Payer: Self-pay | Admitting: Pharmacist

## 2022-02-17 ENCOUNTER — Ambulatory Visit (HOSPITAL_COMMUNITY)
Admission: RE | Admit: 2022-02-17 | Discharge: 2022-02-17 | Disposition: A | Discharge status: Home / Self Care | Payer: Medicaid Other | Source: Ambulatory Visit | Attending: Cardiology | Admitting: Cardiology

## 2022-02-17 DIAGNOSIS — Z95811 Presence of heart assist device: Secondary | ICD-10-CM | POA: Insufficient documentation

## 2022-02-17 DIAGNOSIS — Z7901 Long term (current) use of anticoagulants: Secondary | ICD-10-CM | POA: Diagnosis not present

## 2022-02-17 LAB — BASIC METABOLIC PANEL
Anion gap: 6 (ref 5–15)
BUN: 23 mg/dL — ABNORMAL HIGH (ref 6–20)
CO2: 23 mmol/L (ref 22–32)
Calcium: 9.7 mg/dL (ref 8.9–10.3)
Chloride: 111 mmol/L (ref 98–111)
Creatinine, Ser: 1.45 mg/dL — ABNORMAL HIGH (ref 0.61–1.24)
GFR, Estimated: 60 mL/min — ABNORMAL LOW (ref >60)
Glucose, Bld: 131 mg/dL — ABNORMAL HIGH (ref 70–99)
Potassium: 4.2 mmol/L (ref 3.5–5.1)
Sodium: 140 mmol/L (ref 135–145)

## 2022-02-17 LAB — PROTIME-INR
INR: 1.8 — ABNORMAL HIGH (ref 0.8–1.2)
Prothrombin Time: 20.7 seconds — ABNORMAL HIGH (ref 11.4–15.2)

## 2022-02-17 NOTE — Progress Notes (Signed)
LVAD INR 

## 2022-02-17 NOTE — Progress Notes (Signed)
Pt presents to clinic today for BP check and labs. Pt confirms that he has taken all BP meds this morning. Pt confirms that he started Doxazosin 1 mg as ordered last visit. He is also taking Amlodipine 10 mg, Entresto to 97-103 mg BID, Bidil 20-37.5 2 tablets TID and Spiro 25 mg daily. ? ?Auto BP: 102/61 (75) ?Doppler: 100 ?HR: 87 SR ?O2 Sat: 96%  ? ?Plan:  ?No changes in medications ?Coumadin dosing per Ander Purpura PharmD ?3. Advance dressing changes to once weekly using the daily kit ?4. Return to clinic in 1 month for a visit with DR Bensimhon ? ? ?Tanda Rockers RN ?VAD Coordinator  ?Office: [phone]  ?24/7 Pager: [phone]  ? ? ?

## 2022-02-17 NOTE — Patient Instructions (Signed)
No change in medications ?Advance dressing changes to once weekly using the daily kit ?Return to clinic in 1 month for a visit with DR Bensimhon ?

## 2022-02-25 NOTE — Progress Notes (Unsigned)
Patient ID: SAJID RUPPERT                 DOB: [birth year]                    MRN: [id number]     HPI: Jeffrey [name] is a 46 y.o. male patient referred to pharmacy clinic by Dr. [name] to initiate weight loss therapy with GLP1-RA. PMH is significant for obesity, HTN, R renal cell carcinoma s/p nephrectomy, CKD IIIa (solitary kidney), DM2 (last A1c 5.2% on 12/08/21), Afib, OSA, gout, and systolic HF due to NICM. S/p HM-3 VAD placement 07/21/21 on warfarin with INR goal 2.0-2.5. Has [place]-sci S-ICD. Once BMI improves to < 35, will be referred to [facility] for transplant evaluation. For T2DM, pt is on metformin 1000 mg BID and insulin aspart 70/30 (Novolog 70/30) 38 units BID. Appears pt also having some hypotension with recently increased Entresto. Most recent BMI 49.43 kg/m2 (while wearing LVAD equipment).  Pt presents today for GLP1-RA initiation with his Ozempic 0.25/0.5 mg pen ($4 at pharmacy). Pt is checking his BG 3 times a day before meals. Takes 38 units of insulin BID, but also has a flex pen for SSI if BG prior to eating is >120. BG is ranging from 70-105 before meals typically. Says BG is <70 1-2x/week. Can feel symptoms of hypoglycemia and knows how to appropriately treat this. Keeps sugary snack beside his bed if needed. Pt is eating healthy foods at home but does endorse eating larger portions at each meal. Confirmed no family hx of MEN, MTC, or personal hx of pancreatitis.   Current weight management medications: none  Previously tried meds: none  Current meds that may affect weight: gabapentin (+), insulin (+)  Baseline weight/BMI: 315 lbs (wearing LVAD + equipment), BMI 49.34 kg/m2  Insurance payor: [place] [employer]  Diet:  -Breakfast: oatmeal, [place] sausage, sometimes cereal -Lunch: small lunch, fruit -Dinner: baked or grilled chicken, spinach, broccoli, wild grain rice -Snacks: banana, fruit, chips -Drinks: water  Exercise: Walking with a friend several days/week (per  visit with Dr. [name] on 01/28/22).   Family History: HTN in Mother, DM in maternal Aunt  Social History: Former cigarette smoker (22 pack year hx, quit 2016), denies alcohol use. Marijuana use.   Labs: Lab Results  Component Value Date   HGBA1C 5.2 12/08/2021   HGBA1C 5.2 12/08/2021   HGBA1C 5.2 (A) 12/08/2021   HGBA1C 5.2 12/08/2021    Wt Readings from Last 1 Encounters:  01/28/22 (!) 311 lb 12.8 oz (141.4 kg)    BP Readings from Last 1 Encounters:  02/17/22 102/61   Pulse Readings from Last 1 Encounters:  02/09/22 87       Component Value Date/Time   CHOL 174 06/10/2021 1341   TRIG 120 06/10/2021 1341   HDL 37 (L) 06/10/2021 1341   CHOLHDL 4.7 06/10/2021 1341   CHOLHDL 4.8 05/12/2021 1303   VLDL 24 05/12/2021 1303   LDLCALC 115 (H) 06/10/2021 1341    Past Medical History:  Diagnosis Date   Anxiety    Aspirin allergy    Childhood asthma    Chronic systolic CHF (congestive heart failure) ([place])    a. EF 20-25% in 2012. b. EF 45-50% in 10/2011 with nonischemic nuc - presumed NICM. c. 12/2014 Echo: Sev depressed LV fxn, sev dil LV, mild LVH, mild MR, sev dil LA, mildly reduced RV fxn.   CKD (chronic kidney disease) stage 2, GFR 60-89 ml/min  H/O vasectomy 12/2019   High cholesterol    Hypertension    Morbid obesity ([place])    Nephrolithiasis    OSA on CPAP    Paroxysmal atrial fibrillation ([place])    Presumed NICM    a. 04/2014 Myoview: EF 26%, glob HK, sev glob HK, ? prior infarct;  b. Never cathed 2/2 CKD.   Renal cell carcinoma ([place])    a. s/p Rt robotic assisted partial converted to radical nephrectomy on 01/2013.   Troponin level elevated    a. 04/2014, 12/2014: felt due to CHF.   Type II diabetes mellitus (HCC)    Ventricular tachycardia ([place])    a. appropriate ICD therapy 12/2017    Current Outpatient Medications on File Prior to Visit  Medication Sig Dispense Refill   Accu-Chek FastClix Lancets MISC USE AS DIRECTED FOUR TIMES DAILY 102 each 11    acetaminophen (TYLENOL) 325 MG tablet Take 2 tablets (650 mg total) by mouth every 6 (six) hours as needed for fever.     allopurinol (ZYLOPRIM) 100 MG tablet Take 1 tablet (100 mg total) by mouth daily. 90 tablet 3   amiodarone (PACERONE) 200 MG tablet Take 1 tablet (200 mg total) by mouth daily. 30 tablet 6   amLODipine (NORVASC) 10 MG tablet Take 1 tablet (10 mg total) by mouth daily 30 tablet 6   colchicine 0.6 MG tablet Take 1 tablet (0.6 mg total) by mouth daily. 30 tablet 6   diclofenac Sodium (VOLTAREN) 1 % GEL Apply 2 g topically 4 (four) times daily. 100 g 1   docusate sodium (COLACE) 100 MG capsule Take 1 capsule (100 mg total) by mouth 2 (two) times daily. (Patient not taking: Reported on 09/09/2021) 10 capsule 0   doxazosin (CARDURA) 1 MG tablet Take 1 tablet (1 mg total) by mouth daily. 30 tablet 6   enoxaparin (LOVENOX) 60 MG/0.6ML injection Inject 0.6 mLs (60 mg total) into the skin every 12 (twelve) hours. (Patient not taking: Reported on 02/09/2022) 6 mL 0   gabapentin (NEURONTIN) 300 MG capsule Take 1 capsule (300 mg total) by mouth 3 (three) times daily. 90 capsule 6   glucose blood test strip Use as directed 4 times daily. 100 each 11   insulin aspart protamine - aspart (NOVOLOG 70/30 MIX) (70-30) 100 UNIT/ML FlexPen Inject 38 Units into the skin 2 (two) times daily. 15 mL 11   Insulin Pen Needle (TRUEPLUS 5-BEVEL PEN NEEDLES) 29G X 12.7MM MISC Use as directed in the morning, at noon, and at bedtime. 100 each 3   Insulin Syringe-Needle U-100 (TRUEPLUS INSULIN SYRINGE) 31G X 5/16" 0.5 ML MISC USE TO INJECT INSULIN TWICE DAILY. 100 each 11   isosorbide-hydrALAZINE (BIDIL) 20-37.5 MG tablet Take 2 tablets by mouth 3 (three) times daily. 180 tablet 6   meclizine (ANTIVERT) 25 MG tablet Take 1 tablet (25 mg total) by mouth 3 (three) times daily as needed for dizziness. 90 tablet 2   metFORMIN (GLUCOPHAGE) 1000 MG tablet Take 1 tablet (1,000 mg total) by mouth 2 (two) times daily with a  meal. 60 tablet 11   pantoprazole (PROTONIX) 40 MG tablet Take 1 tablet (40 mg total) by mouth daily. 90 tablet 3   sacubitril-valsartan (ENTRESTO) 97-103 MG Take 1 tablet by mouth 2 (two) times daily. 60 tablet 6   Semaglutide,0.25 or 0.5MG/DOS, (OZEMPIC, 0.25 OR 0.5 MG/DOSE,) 2 MG/3ML SOPN Inject 0.69m subcutaneously once weekly for 4 weeks, then increase to 0.531mweekly (Patient not taking: Reported on 02/09/2022)  3 mL 1   sertraline (ZOLOFT) 100 MG tablet Take 1.5 tablets (150 mg total) by mouth daily. 45 tablet 6   spironolactone (ALDACTONE) 25 MG tablet Take 1 tablet (25 mg total) by mouth daily. 30 tablet 6   traMADol (ULTRAM) 50 MG tablet Take 1-2 tablets (50-100 mg total) by mouth 2 (two) times daily. (Patient not taking: Reported on 01/28/2022) 60 tablet 5   traZODone (DESYREL) 100 MG tablet Take 1 tablet (100 mg total) by mouth at bedtime. 30 tablet 0   warfarin (COUMADIN) 4 MG tablet Take 8 mg (2 tablets) every Monday/Wednesday/Friday and 4 mg (1 tablet) all other days or as directed by HF clinic 180 tablet 3   No current facility-administered medications on file prior to visit.    Allergies  Allergen Reactions   Aspirin Shortness Of Breath, Itching and Rash     Burning sensation (Patient reports he tolerates other NSAIDS)    Bee Venom Hives and Swelling   Lisinopril Cough   Shrimp (Diagnostic) Rash   Tomato Rash     Assessment/Plan:  1. Weight loss/DM2 - Patient has not met goal of at least 5% of body weight loss with comprehensive lifestyle modifications alone in the past 3-6 months. Pharmacotherapy is appropriate to pursue as augmentation. Will start Ozempic. Confirmed no personal or family history of medullary thyroid carcinoma (MTC), Multiple Endocrine Neoplasia syndrome type 2 (MEN 2), or personal hx of pancreatitis.   Advised patient on common side effects including nausea, diarrhea, dyspepsia, decreased appetite, and fatigue. Counseled patient on reducing meal size and  how to titrate medication to minimize side effects. Counseled patient to call if intolerable side effects or if experiencing dehydration, abdominal pain, or dizziness. Patient will adhere to dietary modifications and will target at least 150 minutes of moderate intensity exercise weekly.   Given well-controlled DM with A1c of 5.2% and pre-meal BG ranging from 70-105, with BG <70 noted 1-2x/week, will preemptively decrease TDD of insulin by 10%. Decrease Novolog 70/30 insulin from 38 to 34 units BID. Instructed pt to call pharmacist line if BG is <80 very frequently, as further decrease in insulin may be required. Expected larger decreases will be needed as pt titrates Ozempic dose.   Injection technique reviewed at today's visit and patient successfully self-administered first dose of Ozempic 0.25 mg into the fatty tissue of the abdomen.  Titration Plan:  Will plan to follow the titration plan as below, pending patient is tolerating each dose before increasing to the next. Can slow titration if needed for tolerability.    -Month 1: Inject 0.25 mg SQ once weekly x 4 weeks -Month 2: Inject 0.5 mg SQ once weekly x 6 weeks -Month 3: Inject 1 mg SQ once weekly x 4 weeks -Month 4+: Inject 2 mg SQ once weekly   Follow up in 2-3 weeks via telephone to evaluate tolerability and BG, and 3 months in person.   Patient seen with Park Liter, PY4 PharmD Candidate  Megan E. Supple, PharmD, BCACP, [place] [address] [place], [place] [address] Phone: [phone]; Fax: [phone] 02/26/2022 4:37 PM

## 2022-02-26 ENCOUNTER — Ambulatory Visit: Payer: Medicaid Other | Admitting: Pharmacist

## 2022-02-26 ENCOUNTER — Other Ambulatory Visit (HOSPITAL_COMMUNITY): Payer: Self-pay | Admitting: *Deleted

## 2022-02-26 DIAGNOSIS — N1831 Chronic kidney disease, stage 3a: Secondary | ICD-10-CM

## 2022-02-26 DIAGNOSIS — E1122 Type 2 diabetes mellitus with diabetic chronic kidney disease: Secondary | ICD-10-CM | POA: Diagnosis not present

## 2022-02-26 DIAGNOSIS — Z7901 Long term (current) use of anticoagulants: Secondary | ICD-10-CM

## 2022-02-26 DIAGNOSIS — Z794 Long term (current) use of insulin: Secondary | ICD-10-CM

## 2022-02-26 DIAGNOSIS — Z95811 Presence of heart assist device: Secondary | ICD-10-CM

## 2022-02-26 NOTE — Patient Instructions (Addendum)
It was great to see you today!  Decrease insulin to 34 units twice a day for 1-2 weeks. If you notice frequent blood sugars less than 80, give [place] a call [phone]) and we can cut back on your insulin further.   Otherwise, we'll give you a call to follow up with tolerability, further dose titrations of Ozempic, and dose decreases of insulin  Ozempic Counseling Points This medication reduces your appetite and may make you feel fuller longer.  Stop eating when your body tells you that you are full. This will likely happen sooner than you are used to. Store your medication in the fridge until you are ready to use it. Inject your medication in the fatty tissue of your lower abdominal area (2 inches away from belly button) or upper outer thigh. Rotate injection sites. Each pen will last you about 1 month (the first month it will last a few weeks longer). Use a different needle with each weekly injection. Common side effects include: nausea, diarrhea/constipation, and heartburn, and are more likely to occur if you overeat.  Dosing schedule: - Month 1: Inject Ozempic 0.'25mg'$  subcutaneously once weekly for 4 weeks - Month 2: Inject Ozempic 0.'5mg'$  subcutaneously once weekly for 6 weeks - Month 3: Inject Ozempic '1mg'$  subcutaneously once weekly for 4 weeks - Month 4+: Inject Ozempic '2mg'$  subcutaneously once weekly  Tips for living a healthier life     Building a Healthy and Balanced Diet Make most of your meal vegetables and fruits -  of your plate. Aim for color and variety, and remember that potatoes don't count as vegetables on the Healthy Eating Plate because of their negative impact on blood sugar.  Go for whole grains -  of your plate. Whole and intact grains--whole wheat, barley, wheat berries, quinoa, oats, brown rice, and foods made with them, such as whole wheat pasta--have a milder effect on blood sugar and insulin than white bread, white rice, and other refined grains.  Protein  power -  of your plate. Fish, poultry, beans, and nuts are all healthy, versatile protein sources--they can be mixed into salads, and pair well with vegetables on a plate. Limit red meat, and avoid processed meats such as bacon and sausage.  Healthy plant oils - in moderation. Choose healthy vegetable oils like olive, canola, soy, corn, sunflower, peanut, and others, and avoid partially hydrogenated oils, which contain unhealthy trans fats. Remember that low-fat does not mean "healthy."  Drink water, coffee, or tea. Skip sugary drinks, limit milk and dairy products to one to two servings per day, and limit juice to a small glass per day.  Stay active. The red figure running across the [place] is a reminder that staying active is also important in weight control.  The main message of the Healthy Eating Plate is to focus on diet quality:  The type of carbohydrate in the diet is more important than the amount of carbohydrate in the diet, because some sources of carbohydrate--like vegetables (other than potatoes), fruits, whole grains, and beans--are healthier than others. The Healthy Eating Plate also advises consumers to avoid sugary beverages, a major source of calories--usually with little nutritional value--in the American diet. The Healthy Eating Plate encourages consumers to use healthy oils, and it does not set a maximum on the percentage of calories people should get each day from healthy sources of fat. In this way, the Healthy Eating Plate recommends the opposite of the low-fat message promoted for decades by the USDA.  DeskDistributor.no  SUGAR  Sugar is a huge problem in the modern day diet. Sugar is a big contributor to heart disease, diabetes, high triglyceride levels, fatty liver disease and obesity. Sugar is hidden in almost all packaged foods/beverages. Added sugar is extra sugar that is added beyond what is  naturally found and has no nutritional benefit for your body. The American Heart Association recommends limiting added sugars to no more than 25g for women and 36 grams for men per day. There are many names for sugar including maltose, sucrose (names ending in "ose"), high fructose corn syrup, molasses, cane sugar, corn sweetener, raw sugar, syrup, honey or fruit juice concentrate.   One of the best ways to limit your added sugars is to stop drinking sweetened beverages such as soda, sweet tea, and fruit juice.  There is 65g of added sugars in one 20oz bottle of Coke! That is equal to 7.5 donuts.   Pay attention and read all nutrition facts labels. Below is an examples of a nutrition facts label. The #1 is showing you the total sugars where the # 2 is showing you the added sugars. This one serving has almost the max amount of added sugars per day!     20 oz Soda 65g Sugar = 7.5 Glazed Donuts  16oz Energy  Drink 54g Sugar = 6.5 Glazed Donuts  Large Sweet  Tea 38g Sugar = 4 Glazed Donuts  20oz Sports  Drink 34g Sugar = 3.5 Glazed Donuts  8oz Chocolate Milk 24g Sugar =2.5 Glazed Donuts  8oz Orange  Juice 21g Sugar = 2 Glazed Donuts  1 Juice Box 14g Sugar = 1.5 Glazed Donuts  16oz Water= NO SUGAR!!  EXERCISE  Exercise is good. We've all heard that. In an ideal world, we would all have time and resources to get plenty of it. When you are active, your heart pumps more efficiently and you will feel better.  Multiple studies show that even walking regularly has benefits that include living a longer life. The American Heart Association recommends 150 minutes per week of exercise (30 minutes per day most days of the week). You can do this in any increment you wish. Nine or more 10-minute walks count. So does an hour-long exercise class. Break the time apart into what will work in your life. Some of the best things you can do include walking briskly, jogging, cycling or swimming laps.  Not everyone is ready to "exercise." Sometimes we need to start with just getting active. Here are some easy ways to be more active throughout the day:  Take the stairs instead of the elevator  Go for a 10-15 minute walk during your lunch break (find a friend to make it more enjoyable)  When shopping, park at the back of the parking lot  If you take public transportation, get off one stop early and walk the extra distance  Pace around while making phone calls  Check with your doctor if you aren't sure what your limitations may be. Always remember to drink plenty of water when doing any type of exercise. Don't feel like a failure if you're not getting the 90-150 minutes per week. If you started by being a couch potato, then just a 10-minute walk each day is a huge improvement. Start with little victories and work your way up.   HEALTHY EATING TIPS  When looking to improve your eating habits, whether to lose weight, lower blood pressure or just be healthier, it helps to know what  a serving size is.   Grains 1 slice of bread,  bagel,  cup pasta or rice  Vegetables 1 cup fresh or raw vegetables,  cup cooked or canned Fruits 1 piece of medium sized fruit,  cup canned,   Meats/Proteins  cup dried       1 oz meat, 1 egg,  cup cooked beans, nuts or seeds  Dairy        Fats Individual yogurt container, 1 cup (8oz)    1 teaspoon margarine/butter or vegetable  milk or milk alternative, 1 slice of cheese          oil; 1 tablespoon mayonnaise or salad dressing                  Plan ahead: make a menu of the meals for a week then create a grocery list to go with that menu. Consider meals that easily stretch into a night of leftovers, such as stews or casseroles. Or consider making two of your favorite meal and put one in the freezer for another night. Try a night or two each week that is "meatless" or "no cook" such as salads. When you get home from the grocery store wash and prepare your vegetables  and fruits. Then when you need them they are ready to go.   Tips for going to the grocery store:  [place] store or generic brands  Check the weekly ad from your store on-line or in their in-store flyer  Look at the unit price on the shelf tag to compare/contrast the costs of different items  Buy fruits/vegetables in season  Carrots, bananas and apples are low-cost, naturally healthy items  If meats or frozen vegetables are on sale, buy some extras and put in your freezer  Limit buying prepared or "ready to eat" items, even if they are pre-made salads or fruit snacks  Do not shop when you're hungry  Foods at eye level tend to be more expensive. Look on the high and low shelves for deals.  Consider shopping at the farmer's market for fresh foods in season.  Avoid the cookie and chip aisles (these are expensive, high in calories and low in nutritional value). Shop on the outside of the grocery store.  Healthy food preparations:  If you can't get lean hamburger, be sure to drain the fat when cooking  Steam, saut (in olive oil), grill or bake foods  Experiment with different seasonings to avoid adding salt to your foods. Kosher salt, sea salt and Himalayan salt are all still salt and should be avoided. Try seasoning food with onion, garlic, thyme, rosemary, basil ect. Onion powder or garlic powder is ok. Avoid if it says salt (ie garlic salt).

## 2022-03-03 ENCOUNTER — Other Ambulatory Visit: Payer: Self-pay | Admitting: Nurse Practitioner

## 2022-03-03 ENCOUNTER — Other Ambulatory Visit: Payer: Self-pay

## 2022-03-03 MED ORDER — TRAZODONE HCL 100 MG PO TABS
100.0000 mg | ORAL_TABLET | Freq: Every day | ORAL | 0 refills | Status: DC
  Filled 2022-03-03: qty 30, 30d supply, fill #0

## 2022-03-04 ENCOUNTER — Ambulatory Visit (HOSPITAL_COMMUNITY): Payer: Self-pay | Admitting: Pharmacist

## 2022-03-04 ENCOUNTER — Ambulatory Visit (HOSPITAL_COMMUNITY)
Admission: RE | Admit: 2022-03-04 | Discharge: 2022-03-04 | Disposition: A | Discharge status: Home / Self Care | Payer: Medicaid Other | Source: Ambulatory Visit | Attending: Internal Medicine | Admitting: Internal Medicine

## 2022-03-04 ENCOUNTER — Ambulatory Visit (INDEPENDENT_AMBULATORY_CARE_PROVIDER_SITE_OTHER): Payer: Medicaid Other

## 2022-03-04 ENCOUNTER — Other Ambulatory Visit: Payer: Self-pay

## 2022-03-04 DIAGNOSIS — Z95811 Presence of heart assist device: Secondary | ICD-10-CM | POA: Insufficient documentation

## 2022-03-04 DIAGNOSIS — Z7901 Long term (current) use of anticoagulants: Secondary | ICD-10-CM | POA: Insufficient documentation

## 2022-03-04 DIAGNOSIS — I428 Other cardiomyopathies: Secondary | ICD-10-CM | POA: Diagnosis not present

## 2022-03-04 LAB — PROTIME-INR
INR: 2 — ABNORMAL HIGH (ref 0.8–1.2)
Prothrombin Time: 22.2 seconds — ABNORMAL HIGH (ref 11.4–15.2)

## 2022-03-04 NOTE — Progress Notes (Signed)
LVAD INR 

## 2022-03-05 ENCOUNTER — Other Ambulatory Visit (HOSPITAL_BASED_OUTPATIENT_CLINIC_OR_DEPARTMENT_OTHER): Payer: Self-pay

## 2022-03-05 LAB — CUP PACEART REMOTE DEVICE CHECK
Battery Remaining Percentage: 93 %
Date Time Interrogation Session: 20230531090300
Implantable Lead Implant Date: 20160812
Implantable Lead Location: 753862
Implantable Lead Model: 3401
Implantable Pulse Generator Implant Date: 20210812
Pulse Gen Serial Number: 139514

## 2022-03-10 ENCOUNTER — Other Ambulatory Visit: Payer: Self-pay

## 2022-03-10 ENCOUNTER — Ambulatory Visit: Payer: Medicaid Other | Admitting: Nurse Practitioner

## 2022-03-12 ENCOUNTER — Other Ambulatory Visit: Payer: Self-pay

## 2022-03-12 ENCOUNTER — Telehealth: Payer: Self-pay | Admitting: Pharmacist

## 2022-03-12 ENCOUNTER — Other Ambulatory Visit (HOSPITAL_COMMUNITY): Payer: Self-pay | Admitting: *Deleted

## 2022-03-12 DIAGNOSIS — I5022 Chronic systolic (congestive) heart failure: Secondary | ICD-10-CM

## 2022-03-12 DIAGNOSIS — Z95811 Presence of heart assist device: Secondary | ICD-10-CM

## 2022-03-12 DIAGNOSIS — Z7901 Long term (current) use of anticoagulants: Secondary | ICD-10-CM

## 2022-03-12 NOTE — Telephone Encounter (Signed)
Called pt to follow up with Ozempic tolerability and home glucose readings. Decreased Novolog 70/30 insulin from 38u BID to 34u BID when Ozempic was started due to A1c of 5.2% and pre-meal glucose readings of 70-105.   Pt gives 3rd injection today. Had a high glucose reading on Sunday of 150, a low yesterday morning fasting of 60, otherwise no lows and readings well controlled.  Advised pt after he gives his 4th injection of Ozempic 0.'25mg'$  next week, to increase to 0.'5mg'$  weekly. Also advised him if he starts noticing more lows to preemptively decrease his insulin further to 30u BID, and to also call me if he has any other trouble with his glucose readings in the interim. He verbalized understanding and was appreciative for the call.

## 2022-03-12 NOTE — Patient Instructions (Signed)
Visit Information  Jeffrey [name] was given information about Medicaid Managed Care team care coordination services as a part of their Healthy [facility] Medicaid benefit. Jeffrey [name] verbally consented to engagement with the [facility] Managed Care team.   If you are experiencing a medical emergency, please call 911 or report to your local emergency department or urgent care.   If you have a non-emergency medical problem during routine business hours, please contact your provider's office and ask to speak with a nurse.   For questions related to your Healthy [facility] health plan, please call: [phone] or visit the homepage here: [email or web address]  If you would like to schedule transportation through your Healthy [facility] plan, please call the following number at least 2 days in advance of your appointment: [phone]  For information about your ride after you set it up, call Ride Assist at [phone]. Use this number to activate a Will Call pickup, or if your transportation is late for a scheduled pickup. Use this number, too, if you need to make a change or cancel a previously scheduled reservation.  If you need transportation services right away, call [phone]. The after-hours call center is staffed 24 hours to handle ride assistance and urgent reservation requests (including discharges) 365 days a year. Urgent trips include sick visits, hospital discharge requests and life-sustaining treatment.  Call the [place] at [phone], at any time, 24 hours a day, 7 days a week. If you are in danger or need immediate medical attention call 911.  If you would like help to quit smoking, call 1-800-QUIT-NOW 575-479-6164) OR Espaol: 1-855-Djelo-Ya (7-001-749-4496) o para ms informacin haga clic aqu or Text READY to 200-400 to register via text  Jeffrey [name] - following are the goals we discussed in your visit today:   Goals  Addressed   None       Social Worker will follow up in 30-45 days .   Jeffrey [name], BSW, [place]  High Risk Managed Medicaid Team  [phone]   Following is a copy of your plan of care:  Care Plan : General Plan of Care (Adult)  Updates made by Ethelda Chick since 03/12/2022 12:00 AM     Problem: Health Promotion or Disease Self-Management (General Plan of Care)   Priority: High  Onset Date: 09/24/2020  Note:   Current Barriers:  Chronic Disease Management support and education needs, S/P LVAD 07/21/21 01/12/22:  Patient with no food stamp or disability determination yet-states he will follow up with DSS.  Has received dental resources-has not chosen a dentist yet-is working on getting an appt.  No complaints today-Zoloft has helped depression symptoms and plans on attending support group.  Skin infection is healing  Nurse Case Manager Clinical Goal(s):  Over the next 90 days, patient will attend all scheduled medical appointments: Over the next 30 days, patient wil attend HF and LVAD support group Over the next 30 days, patient will have dental appt scheduled  Interventions:  Inter-disciplinary care team collaboration (see longitudinal plan of care) Collaborated with BSW regarding food stamps and dental resources. BSW referral for food stamp updates and dental resources-completed Evaluation of current treatment plan and patient's adherence to plan as established by provider.  BSW contacted patient regarding daycare voucher and foodstamps. BSW informed patient he would have to contact DSS and have the child's name placed on the waitlist, patient can also apply for foodstamps at the same time. Patient reports no  other resources needed at this time.  09/04/21: BSW spoke with patient, he stated he is not sure if they have been placed on the daycare voucher because his wife has not said anything to him yet. Patient stated they have applied  for foodstamps but have not received them, he thinks its because his wife makes to much money. No other resources are needed at this time. 10/22/21: Bsw completed a follow up telephone call with patient regarding dental resources and follow up with his foodstamps. Patient stated he would like for dental resources to be mailed to him. Patient asked BSW if she could provide an update on his foodstamp application, BSW informed patient he would have to contact DSS call center or his worker to get an update on his foodstamp application. No other resources are needed at this time. 12/15/21: BSW completed telephone outreach with patient. He states he did not receieve the resources for dental. BSW will resend letter. BSW informed patient it may benefit him to going up to DSS to inquire about his foodstamp application. Sometimes it is easier to go up there. Patient stated he was going to try the group session for now and is not interested in therapy, BSW did provide him with the information for [facility].No other resources are needed at this time. 01/02/22: BSW completed telephone outreach with patient. He states he did receive the letter with the resources and is waiting on call backs. Patient states he has not made it down to DSS yet to check on his foodstamp application. No other resources are needed at this time.  03/12/22: BSW competed telephone outreach with patient's wife. She stated they were able to get a daycare voucher and they did apply for foodstamps and she has to turn in her check stubs. She stated no other resources are needed at this time. Reviewed medications with patient. Discussed plans with patient for ongoing care management follow up and provided patient with direct contact information for care management team Provided patient with educational materials related to exercise. Reviewed scheduled/upcoming provider appointments. Collaborated with Pharmacy Pharmacy referral for medication review  Patient  Goals/Self-Care Activities Over the next 90 days, patient will:  -Attends all scheduled provider appointments Calls provider office for new concerns or questions  Follow Up Plan: The Managed Medicaid care management team will reach out to the patient again over the next 30 days.  The patient has been provided with contact information for the Managed Medicaid care management team and has been advised to call with any health related questions or concerns.

## 2022-03-12 NOTE — Patient Outreach (Signed)
Medicaid Managed Care Social Work Note  03/12/2022 Name:  Jeffrey [name] MRN:  [id number] DOB:  24-Jun-1976  Jeffrey [name] is an 46 y.o. year old male who is a primary patient of [place], Jake Church I, NP.  The Medicaid Managed Care Coordination team was consulted for assistance with:  Community Resources   Mr. Mierzwa was given information about Medicaid Managed Care Coordination team services today. Jeffrey [name]  spouse agreed to services and verbal consent obtained.  Engaged with patient  for by telephone forfollow up visit in response to referral for case management and/or care coordination services.   Assessments/Interventions:  Review of past medical history, allergies, medications, health status, including review of consultants reports, laboratory and other test data, was performed as part of comprehensive evaluation and provision of chronic care management services.  SDOH: (Social Determinant of Health) assessments and interventions performed: BSW competed telephone outreach with patient's wife. She stated they were able to get a daycare voucher and they did apply for foodstamps and she has to turn in her check stubs. She stated no other resources are needed at this time.  Advanced Directives Status:  Not addressed in this encounter.  Care Plan                 Allergies  Allergen Reactions   Aspirin Shortness Of Breath, Itching and Rash     Burning sensation (Patient reports he tolerates other NSAIDS)    Bee Venom Hives and Swelling   Lisinopril Cough   Shrimp (Diagnostic) Rash   Tomato Rash    Medications Reviewed Today     Reviewed by Gayla Medicus, RN (Registered Nurse) on 02/13/22 at 1445  Med List Status: <None>   Medication Order Taking? Sig Documenting Provider Last Dose Status Informant  Accu-Chek FastClix Lancets MISC [id number] No USE AS DIRECTED FOUR TIMES DAILY Vevelyn Francois, NP Taking Active   acetaminophen (TYLENOL) 325 MG tablet [id number] No Take 2 tablets (650 mg  total) by mouth every 6 (six) hours as needed for fever. Darrick Grinder D, NP Taking Active Self  allopurinol (ZYLOPRIM) 100 MG tablet [id number] No Take 1 tablet (100 mg total) by mouth daily. Cathlyn Parsons, PA-C Taking Active Self  amiodarone (PACERONE) 200 MG tablet [id number] No Take 1 tablet (200 mg total) by mouth daily. Darrick Grinder D, NP Taking Active   amLODipine (NORVASC) 10 MG tablet [id number] No Take 1 tablet (10 mg total) by mouth daily Bensimhon, Shaune Pascal, MD Taking Active   colchicine 0.6 MG tablet [id number] No Take 1 tablet (0.6 mg total) by mouth daily. Darrick Grinder D, NP Taking Active   diclofenac Sodium (VOLTAREN) 1 % GEL [id number] No Apply 2 g topically 4 (four) times daily. Izora Ribas, MD Taking Active   docusate sodium (COLACE) 100 MG capsule [id number] No Take 1 capsule (100 mg total) by mouth 2 (two) times daily.  Patient not taking: Reported on 09/09/2021   Darrick Grinder D, NP Not Taking Active Self  doxazosin (CARDURA) 1 MG tablet [id number]  Take 1 tablet (1 mg total) by mouth daily. Bensimhon, Shaune Pascal, MD  Active   enoxaparin (LOVENOX) 60 MG/0.6ML injection [id number] No Inject 0.6 mLs (60 mg total) into the skin every 12 (twelve) hours.  Patient not taking: Reported on 02/09/2022   Bensimhon, Shaune Pascal, MD Not Taking Active   gabapentin (NEURONTIN) 300 MG capsule [id number]  Take 1 capsule (300 mg total) by mouth 3 (three) times daily. Bensimhon, Quillian Quince  R, MD  Active   glucose blood test strip [id number] No Use as directed 4 times daily. Bo Merino I, NP Taking Active   insulin aspart protamine - aspart (NOVOLOG 70/30 MIX) (70-30) 100 UNIT/ML FlexPen [id number] No Inject 38 Units into the skin 2 (two) times daily. [place], Maricela Bo, FNP Taking Active Self  Insulin Pen Needle (TRUEPLUS 5-BEVEL PEN NEEDLES) 29G X 12.7MM MISC [id number] No Use as directed in the morning, at noon, and at bedtime. Bo Merino I, NP Taking Active   Insulin Syringe-Needle U-100 (TRUEPLUS  INSULIN SYRINGE) 31G X 5/16" 0.5 ML MISC [id number] No USE TO INJECT INSULIN TWICE DAILY. Bo Merino I, NP Taking Active   isosorbide-hydrALAZINE (BIDIL) 20-37.5 MG tablet [id number] No Take 2 tablets by mouth 3 (three) times daily. Bensimhon, Shaune Pascal, MD Taking Active   meclizine (ANTIVERT) 25 MG tablet [id number] No Take 1 tablet (25 mg total) by mouth 3 (three) times daily as needed for dizziness. Bensimhon, Shaune Pascal, MD Taking Active   metFORMIN (GLUCOPHAGE) 1000 MG tablet [id number] No Take 1 tablet (1,000 mg total) by mouth 2 (two) times daily with a meal. [place], Maricela Bo, FNP Taking Active Self  pantoprazole (PROTONIX) 40 MG tablet [id number] No Take 1 tablet (40 mg total) by mouth daily. Bensimhon, Shaune Pascal, MD Taking Active   sacubitril-valsartan (ENTRESTO) 97-103 MG [id number] No Take 1 tablet by mouth 2 (two) times daily. Bensimhon, Shaune Pascal, MD Taking Active   Semaglutide,0.25 or 0.'5MG'$ /DOS, (OZEMPIC, 0.25 OR 0.5 MG/DOSE,) 2 MG/3ML SOPN [id number] No Inject 0.'25mg'$  subcutaneously once weekly for 4 weeks, then increase to 0.'5mg'$  weekly  Patient not taking: Reported on 02/09/2022   Bensimhon, Shaune Pascal, MD Not Taking Active   sertraline (ZOLOFT) 100 MG tablet [id number] No Take 1.5 tablets (150 mg total) by mouth daily. Bensimhon, Shaune Pascal, MD Taking Active   spironolactone (ALDACTONE) 25 MG tablet [id number] No Take 1 tablet (25 mg total) by mouth daily. Cathlyn Parsons, PA-C Taking Active Self  traMADol (ULTRAM) 50 MG tablet [id number] No Take 1-2 tablets (50-100 mg total) by mouth 2 (two) times daily.  Patient not taking: Reported on 01/28/2022   BensimhonShaune Pascal, MD Not Taking Active   traZODone (DESYREL) 100 MG tablet [id number] No Take 1 tablet (100 mg total) by mouth at bedtime. Bo Merino I, NP Taking Active   warfarin (COUMADIN) 4 MG tablet [id number] No Take 8 mg (2 tablets) every Monday/Wednesday/Friday and 4 mg (1 tablet) all other days or as directed by HF clinic Bensimhon,  Shaune Pascal, MD Taking Active            Med Note Orma Render   Mon Dec 22, 2021 11:24 AM) 4 mg every Tuesday/Saturday and 8 mg all other days            Patient Active Problem List   Diagnosis Date Noted   Presence of left ventricular assist device (LVAD) ([place]) 07/25/2021   Acute on chronic systolic CHF (congestive heart failure) ([place]) 07/18/2021   Cardiogenic shock (HCC)    Atrial fibrillation with RVR ([place]) 04/30/2021   Acute hypoxemic respiratory failure (HCC)    Right wrist pain 04/16/2021   Absolute anemia    Rectal bleeding    Iron deficiency 12/25/2020   Long term current use of anticoagulant - apixaban 12/25/2020   Paroxysmal atrial fibrillation (HCC)    Lactic acidosis 11/22/2020   Hypotension 11/22/2020   Aspiration into airway    ICD (implantable cardioverter-defibrillator) battery depletion  05/15/2020   Endotracheal tube present    Respiratory failure (HCC)    Acute encephalopathy    Acute pulmonary edema (HCC)    NICM (nonischemic cardiomyopathy) ([place]) 04/24/2020   S/P vasectomy 12/20/2019   Anxiety 12/20/2019   Hemoglobin A1C between 7% and 9% indicating borderline diabetic control ([place]) 12/20/2019   Seasonal allergies 12/20/2019   Insomnia 12/20/2019   Vasectomy evaluation 06/20/2019   Visual problems 06/20/2019   Blurry vision, bilateral 06/20/2019   Hyperglycemia 02/13/2019   Hyperosmolar (nonketotic) coma ([place]) 01/19/2019   AICD (automatic cardioverter/defibrillator) present 01/19/2019   Hyperlipidemia 12/07/2018   Follow-up exam 11/22/2018   Class 3 severe obesity due to excess calories with serious comorbidity and body mass index (BMI) of 45.0 to 49.9 in adult ([place]) 11/22/2018   Dizziness 11/22/2018   Lingular pneumonia 08/04/2018   Ventricular tachycardia ([place]) 12/30/2017   PAF (paroxysmal atrial fibrillation) ([place])    Dilated cardiomyopathy ([place])    Pollen allergy 02/17/2017   Dyslipidemia 02/17/2017   Hematochezia 11/08/2015   CKD  (chronic kidney disease), stage III ([place]) 06/20/2015   Cardiac defibrillator in situ    Chronic systolic CHF (congestive heart failure) ([place]) 03/11/2015   Gouty arthritis 03/11/2015   Chest pain 12/28/2014   Acute renal failure superimposed on stage 3 chronic kidney disease ([place]) 04/08/2014   Aspirin allergy 04/08/2014   Renal cell carcinoma ([place])    Gout attack 11/10/2011   Morbid obesity ([place]) 10/23/2011   OSA (obstructive sleep apnea) 10/23/2011   Type 2 diabetes mellitus with stage 3 chronic kidney disease, with long-term current use of insulin ([place]) 10/22/2011   Hyperlipidemia associated with type 2 diabetes mellitus ([place]) 12/25/2010   Hypertension associated with diabetes ([place]) 12/25/2010    Conditions to be addressed/monitored per PCP order:   community resources  Care Plan : General Plan of Care (Adult)  Updates made by Ethelda Chick since 03/12/2022 12:00 AM     Problem: Health Promotion or Disease Self-Management (General Plan of Care)   Priority: High  Onset Date: 09/24/2020  Note:   Current Barriers:  Chronic Disease Management support and education needs, S/P LVAD 07/21/21 01/12/22:  Patient with no food stamp or disability determination yet-states he will follow up with DSS.  Has received dental resources-has not chosen a dentist yet-is working on getting an appt.  No complaints today-Zoloft has helped depression symptoms and plans on attending support group.  Skin infection is healing  Nurse Case Manager Clinical Goal(s):  Over the next 90 days, patient will attend all scheduled medical appointments: Over the next 30 days, patient wil attend HF and LVAD support group Over the next 30 days, patient will have dental appt scheduled  Interventions:  Inter-disciplinary care team collaboration (see longitudinal plan of care) Collaborated with BSW regarding food stamps and dental resources. BSW referral for food stamp updates and dental resources-completed Evaluation  of current treatment plan and patient's adherence to plan as established by provider.  BSW contacted patient regarding daycare voucher and foodstamps. BSW informed patient he would have to contact DSS and have the child's name placed on the waitlist, patient can also apply for foodstamps at the same time. Patient reports no other resources needed at this time.  09/04/21: BSW spoke with patient, he stated he is not sure if they have been placed on the daycare voucher because his wife has not said anything to him yet. Patient stated they have applied for foodstamps but have not received them, he thinks its  because his wife makes to much money. No other resources are needed at this time. 10/22/21: Bsw completed a follow up telephone call with patient regarding dental resources and follow up with his foodstamps. Patient stated he would like for dental resources to be mailed to him. Patient asked BSW if she could provide an update on his foodstamp application, BSW informed patient he would have to contact DSS call center or his worker to get an update on his foodstamp application. No other resources are needed at this time. 12/15/21: BSW completed telephone outreach with patient. He states he did not receieve the resources for dental. BSW will resend letter. BSW informed patient it may benefit him to going up to DSS to inquire about his foodstamp application. Sometimes it is easier to go up there. Patient stated he was going to try the group session for now and is not interested in therapy, BSW did provide him with the information for [facility].No other resources are needed at this time. 01/02/22: BSW completed telephone outreach with patient. He states he did receive the letter with the resources and is waiting on call backs. Patient states he has not made it down to DSS yet to check on his foodstamp application. No other resources are needed at this time.  03/12/22: BSW competed telephone outreach with patient's wife. She  stated they were able to get a daycare voucher and they did apply for foodstamps and she has to turn in her check stubs. She stated no other resources are needed at this time. Reviewed medications with patient. Discussed plans with patient for ongoing care management follow up and provided patient with direct contact information for care management team Provided patient with educational materials related to exercise. Reviewed scheduled/upcoming provider appointments. Collaborated with Pharmacy Pharmacy referral for medication review  Patient Goals/Self-Care Activities Over the next 90 days, patient will:  -Attends all scheduled provider appointments Calls provider office for new concerns or questions  Follow Up Plan: The Managed Medicaid care management team will reach out to the patient again over the next 30 days.  The patient has been provided with contact information for the Managed Medicaid care management team and has been advised to call with any health related questions or concerns.         Follow up:  Patient agrees to Care Plan and Follow-up.  Plan: The Managed Medicaid care management team will reach out to the patient again over the next 30-45 days.  Date/time of next scheduled Social Work care management/care coordination outreach:  04/14/22  [name], Arita Miss, [place] Medicaid Team  [phone]

## 2022-03-16 ENCOUNTER — Encounter: Payer: Self-pay | Admitting: Nurse Practitioner

## 2022-03-16 ENCOUNTER — Other Ambulatory Visit: Payer: Self-pay

## 2022-03-16 ENCOUNTER — Ambulatory Visit: Payer: Medicaid Other | Admitting: Nurse Practitioner

## 2022-03-16 VITALS — BP 84/58 | HR 88 | Temp 98.0°F | Ht 67.0 in | Wt 316.2 lb

## 2022-03-16 DIAGNOSIS — E1122 Type 2 diabetes mellitus with diabetic chronic kidney disease: Secondary | ICD-10-CM | POA: Diagnosis not present

## 2022-03-16 DIAGNOSIS — E1159 Type 2 diabetes mellitus with other circulatory complications: Secondary | ICD-10-CM | POA: Diagnosis not present

## 2022-03-16 DIAGNOSIS — M1A9XX0 Chronic gout, unspecified, without tophus (tophi): Secondary | ICD-10-CM

## 2022-03-16 DIAGNOSIS — N183 Chronic kidney disease, stage 3 unspecified: Secondary | ICD-10-CM | POA: Diagnosis not present

## 2022-03-16 DIAGNOSIS — Z95811 Presence of heart assist device: Secondary | ICD-10-CM | POA: Diagnosis not present

## 2022-03-16 DIAGNOSIS — Z794 Long term (current) use of insulin: Secondary | ICD-10-CM

## 2022-03-16 DIAGNOSIS — I5023 Acute on chronic systolic (congestive) heart failure: Secondary | ICD-10-CM

## 2022-03-16 DIAGNOSIS — I152 Hypertension secondary to endocrine disorders: Secondary | ICD-10-CM

## 2022-03-16 DIAGNOSIS — Z7901 Long term (current) use of anticoagulants: Secondary | ICD-10-CM

## 2022-03-16 DIAGNOSIS — T827XXA Infection and inflammatory reaction due to other cardiac and vascular devices, implants and grafts, initial encounter: Secondary | ICD-10-CM | POA: Diagnosis not present

## 2022-03-16 DIAGNOSIS — I5022 Chronic systolic (congestive) heart failure: Secondary | ICD-10-CM

## 2022-03-16 LAB — POCT GLYCOSYLATED HEMOGLOBIN (HGB A1C): Hemoglobin A1C: 5.3 % (ref 4.0–5.6)

## 2022-03-16 MED ORDER — GABAPENTIN 300 MG PO CAPS
300.0000 mg | ORAL_CAPSULE | Freq: Three times a day (TID) | ORAL | 6 refills | Status: DC
  Filled 2022-03-16: qty 90, 30d supply, fill #0
  Filled 2022-05-08: qty 90, 30d supply, fill #1

## 2022-03-16 MED ORDER — PANTOPRAZOLE SODIUM 40 MG PO TBEC
40.0000 mg | DELAYED_RELEASE_TABLET | Freq: Every day | ORAL | 3 refills | Status: DC
  Filled 2022-03-16: qty 90, 90d supply, fill #0

## 2022-03-16 MED ORDER — COLCHICINE 0.6 MG PO TABS
0.6000 mg | ORAL_TABLET | Freq: Every day | ORAL | 6 refills | Status: DC
  Filled 2022-03-16 – 2022-04-13 (×2): qty 30, 30d supply, fill #0
  Filled 2022-05-20: qty 30, 30d supply, fill #1

## 2022-03-16 NOTE — Patient Instructions (Signed)
You were seen today in the PCC for reevaluation of chronic illness . Labs were collected, results will be available via MyChart or, if abnormal, you will be contacted by clinic staff. Please follow up in 6 mths  for reevaluation of chronic illness. ?

## 2022-03-16 NOTE — Progress Notes (Signed)
[address] [place], [place]  [address] Phone:  [phone]   Fax:  [phone] Subjective:   Patient ID: [facility], male    DOB: Jul 15, 1976, 46 y.o.   MRN: [id number]  Chief Complaint  Patient presents with   Diabetes    3 month follow up   HPI [name] 46 y.o. male  has a past medical history of Anxiety, Aspirin allergy, Childhood asthma, Chronic systolic CHF (congestive heart failure) ([place]), CKD (chronic kidney disease) stage 2, GFR 60-89 ml/min, H/O vasectomy (12/2019), High cholesterol, Hypertension, Morbid obesity ([place]), Nephrolithiasis, OSA on CPAP, Paroxysmal atrial fibrillation ([place]), Presumed NICM, Renal cell carcinoma ([place]), Troponin level elevated, Type II diabetes mellitus ([place]), and Ventricular tachycardia ([place]). To the [facility] for reevaluation of chronic illness.  Diabetes Mellitus: Patient presents for follow up of diabetes. Symptoms: none. Denies any symptoms. Patient denies foot ulcerations, hypoglycemia , and nausea.  Evaluation to date has been included: hemoglobin A1C.  Home sugars: patient does not check sugars. Treatment to date: no recent interventions.   Hypertension: Patient here for follow-up of elevated blood pressure. He is not exercising and is not adherent to low salt diet. Cardiac symptoms none. Patient denies claudication, dyspnea, fatigue, and near-syncope.  Cardiovascular risk factors: hypertension, male gender, and obesity (BMI >= 30 kg/m2). Use of agents associated with hypertension: none. History of target organ damage: none.   Denies any other concerns today. Denies any fatigue, chest pain, shortness of breath, HA or dizziness. Denies any blurred vision, numbness or tingling.  Past Medical History:  Diagnosis Date   Anxiety    Aspirin allergy    Childhood asthma    Chronic systolic CHF (congestive heart failure) ([place])    a. EF 20-25% in 2012. b. EF 45-50% in 10/2011 with nonischemic nuc - presumed NICM. c.  12/2014 Echo: Sev depressed LV fxn, sev dil LV, mild LVH, mild MR, sev dil LA, mildly reduced RV fxn.   CKD (chronic kidney disease) stage 2, GFR 60-89 ml/min    H/O vasectomy 12/2019   High cholesterol    Hypertension    Morbid obesity ([place])    Nephrolithiasis    OSA on CPAP    Paroxysmal atrial fibrillation ([place])    Presumed NICM    a. 04/2014 Myoview: EF 26%, glob HK, sev glob HK, ? prior infarct;  b. Never cathed 2/2 CKD.   Renal cell carcinoma ([place])    a. s/p Rt robotic assisted partial converted to radical nephrectomy on 01/2013.   Troponin level elevated    a. 04/2014, 12/2014: felt due to CHF.   Type II diabetes mellitus ([place])    Ventricular tachycardia ([place])    a. appropriate ICD therapy 12/2017    Past Surgical History:  Procedure Laterality Date   APPENDECTOMY  07/2004   BIOPSY  02/10/2021   Procedure: BIOPSY;  Surgeon: Gatha Mayer, MD;  [facility]: [facility];  Service: Endoscopy;;   CARDIAC CATHETERIZATION N/A 05/17/2015   Procedure: Right/Left Heart Cath and Coronary Angiography;  Surgeon: Jolaine Artist, MD;  Location: [place] [facility];  Service: Cardiovascular;  Laterality: N/A;   CARDIOVERSION N/A 04/30/2021   Procedure: CARDIOVERSION;  Surgeon: Larey Dresser, MD;  Location: [facility] ENDOSCOPY;  Service: Cardiovascular;  Laterality: N/A;   CARDIOVERSION N/A 07/27/2021   Procedure: CARDIOVERSION;  Surgeon: Jolaine Artist, MD;  Location: [place];  Service: Cardiovascular;  Laterality: N/A;   COLONOSCOPY WITH PROPOFOL N/A 02/10/2021  Procedure: COLONOSCOPY WITH PROPOFOL;  Surgeon: Gatha Mayer, MD;  [facility];  Service: Endoscopy;  Laterality: N/A;   EP IMPLANTABLE DEVICE N/A 05/17/2015   Procedure: SubQ ICD Implant;  Surgeon: Will Meredith Leeds, MD;  Location: [place] [facility];  Service: Cardiovascular;  Laterality: N/A;   ESOPHAGOGASTRODUODENOSCOPY (EGD) WITH PROPOFOL N/A 02/10/2021   Procedure: ESOPHAGOGASTRODUODENOSCOPY (EGD) WITH PROPOFOL;   Surgeon: Gatha Mayer, MD;  [facility];  Service: Endoscopy;  Laterality: N/A;   INSERTION OF IMPLANTABLE LEFT VENTRICULAR ASSIST DEVICE N/A 07/21/2021   Procedure: INSERTION OF IMPLANTABLE LEFT VENTRICULAR ASSIST DEVICE;  Surgeon: Gaye Pollack, MD;  Location: [place];  Service: Open Heart Surgery;  Laterality: N/A;   RIGHT HEART CATH N/A 05/13/2021   Procedure: RIGHT HEART CATH;  Surgeon: Jolaine Artist, MD;  Location: [place] [facility];  Service: Cardiovascular;  Laterality: N/A;   RIGHT HEART CATH N/A 07/18/2021   Procedure: RIGHT HEART CATH;  Surgeon: Jolaine Artist, MD;  Location: [place] [facility];  Service: Cardiovascular;  Laterality: N/A;   ROBOTIC ASSISTED LAPAROSCOPIC LYSIS OF ADHESION  01/13/2013   Procedure: ROBOTIC ASSISTED LAPAROSCOPIC LYSIS OF ADHESION EXTENSIVE;  Surgeon: Alexis Frock, MD;  [facility];  Service: Urology;;   ROBOTIC ASSITED PARTIAL NEPHRECTOMY Right 01/13/2013   Procedure: ROBOTIC ASSITED PARTIAL NEPHRECTOMY CONVERTED TO ROBOTIC ASSISTED RIGHT RADICAL NEPHRECTOMY;  Surgeon: Alexis Frock, MD;  [facility];  Service: Urology;  Laterality: Right;   SUBQ ICD CHANGEOUT N/A 05/16/2020   Procedure: SUBQ ICD CHANGEOUT;  Surgeon: [name], MD;  Location: [place] [facility];  Service: Cardiovascular;  Laterality: N/A;   SUBQ ICD CHANGEOUT N/A 05/15/2020   Procedure: SUBQ ICD CHANGEOUT;  Surgeon: [name], MD;  Location: [place] [facility];  Service: Cardiovascular;  Laterality: N/A;   TEE WITHOUT CARDIOVERSION N/A 04/30/2021   Procedure: TRANSESOPHAGEAL ECHOCARDIOGRAM (TEE);  Surgeon: Larey Dresser, MD;  Location: [facility] ENDOSCOPY;  Service: Cardiovascular;  Laterality: N/A;   TEE WITHOUT CARDIOVERSION N/A 07/21/2021   Procedure: TRANSESOPHAGEAL ECHOCARDIOGRAM (TEE);  Surgeon: Gaye Pollack, MD;  Location: [place];  Service: Open Heart Surgery;  Laterality: N/A;   VASECTOMY      Family History  Problem Relation Age of  Onset   Hypertension Mother    Diabetes Maternal Aunt    Heart attack Neg Hx    Stroke Neg Hx    Colon cancer Neg Hx    Pancreatic cancer Neg Hx    Stomach cancer Neg Hx    Liver cancer Neg Hx    Esophageal cancer Neg Hx     Social History   Socioeconomic History   Marital status: Married    Spouse name: Janes Colegrove   Number of children: 39   Years of education: Not on file   Highest education level: Not on file  Occupational History   Not on file  Tobacco Use   Smoking status: Former    Packs/day: 0.25    Years: 22.00    Total pack years: 5.50    Types: Cigarettes    Quit date: 04/30/2015    Years since quitting: 6.8   Smokeless tobacco: Never  Vaping Use   Vaping Use: Never used  Substance and Sexual Activity   Alcohol use: Not Currently    Comment: none   Drug use: Yes    Types: Marijuana   Sexual activity: Not Currently    Birth control/protection: Condom  Other Topics  Concern   Not on file  Social History Narrative   Married - 10 kids 53 boys 7 girls many are adults, 2 young children with current wife   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   Social Determinants of Health   Financial Resource Strain: High Risk (12/12/2021)   Overall Financial Resource Strain (CARDIA)    Difficulty of Paying Living Expenses: Hard  Food Insecurity: Food Insecurity Present (01/12/2022)   Hunger Vital Sign    Worried About Running Out of Food in the Last Year: Sometimes true    Ran Out of Food in the Last Year: Sometimes true  Transportation Needs: Unmet Transportation Needs (02/13/2022)   PRAPARE - Hydrologist (Medical): Yes    Lack of Transportation  (Non-Medical): Yes  Physical Activity: Insufficiently Active (10/17/2021)   Exercise Vital Sign    Days of Exercise per Week: 3 days    Minutes of Exercise per Session: 30 min  Stress: No Stress Concern Present (10/21/2021)   [place]    Feeling of Stress : Only a little  Social Connections: Moderately Integrated (10/21/2021)   Social Connection and Isolation Panel [NHANES]    Frequency of Communication with Friends and Family: More than three times a week    Frequency of Social Gatherings with Friends and Family: More than three times a week    Attends Religious Services: 1 to 4 times per year    Active Member of [employer] or Organizations: No    Attends Archivist Meetings: Never    Marital Status: Married  Human resources officer Violence: Not At Risk (02/13/2022)   Humiliation, Afraid, Rape, and Kick questionnaire    Fear of Current or Ex-Partner: No    Emotionally Abused: No    Physically Abused: No    Sexually Abused: No    Outpatient Medications Prior to Visit  Medication Sig Dispense Refill   Accu-Chek FastClix Lancets MISC USE AS DIRECTED FOUR TIMES DAILY 102 each 11   acetaminophen (TYLENOL) 325 MG tablet Take 2 tablets (650 mg total) by mouth every 6 (six) hours as needed for fever.     allopurinol (ZYLOPRIM) 100 MG tablet Take 1 tablet (100 mg total) by mouth daily. 90 tablet 3   amiodarone (PACERONE) 200 MG tablet Take 1 tablet (200 mg total) by mouth daily. 30 tablet 6   amLODipine (NORVASC) 10 MG tablet Take 1 tablet (10 mg total) by mouth daily 30 tablet 6   diclofenac Sodium (VOLTAREN) 1 % GEL Apply 2 g topically 4 (four) times daily. 100 g 1   docusate sodium (COLACE) 100 MG capsule Take 1 capsule (100 mg total) by mouth 2 (two) times daily. 10 capsule 0   doxazosin (CARDURA) 1 MG tablet Take 1 tablet (1 mg total) by mouth daily. 30 tablet 6   enoxaparin (LOVENOX) 60 MG/0.6ML injection Inject 0.6 mLs  (60 mg total) into the skin every 12 (twelve) hours. 6 mL 0   glucose blood test strip Use as directed 4 times daily. 100 each 11   insulin aspart protamine - aspart (NOVOLOG 70/30 MIX) (70-30) 100 UNIT/ML  FlexPen Inject 34 Units into the skin 2 (two) times daily. 15 mL 11   Insulin Pen Needle (TRUEPLUS 5-BEVEL PEN NEEDLES) 29G X 12.7MM MISC Use as directed in the morning, at noon, and at bedtime. 100 each 3   Insulin Syringe-Needle U-100 (TRUEPLUS INSULIN SYRINGE) 31G X 5/16" 0.5 ML MISC USE TO INJECT INSULIN TWICE DAILY. 100 each 11   isosorbide-hydrALAZINE (BIDIL) 20-37.5 MG tablet Take 2 tablets by mouth 3 (three) times daily. 180 tablet 6   meclizine (ANTIVERT) 25 MG tablet Take 1 tablet (25 mg total) by mouth 3 (three) times daily as needed for dizziness. 90 tablet 2   metFORMIN (GLUCOPHAGE) 1000 MG tablet Take 1 tablet (1,000 mg total) by mouth 2 (two) times daily with a meal. 60 tablet 11   sacubitril-valsartan (ENTRESTO) 97-103 MG Take 1 tablet by mouth 2 (two) times daily. 60 tablet 6   Semaglutide,0.25 or 0.'5MG'$ /DOS, (OZEMPIC, 0.25 OR 0.5 MG/DOSE,) 2 MG/3ML SOPN Inject 0.'25mg'$  subcutaneously once weekly for 4 weeks, then increase to 0.'5mg'$  weekly 3 mL 1   sertraline (ZOLOFT) 100 MG tablet Take 1.5 tablets (150 mg total) by mouth daily. 45 tablet 6   spironolactone (ALDACTONE) 25 MG tablet Take 1 tablet (25 mg total) by mouth daily. 30 tablet 6   traMADol (ULTRAM) 50 MG tablet Take 1-2 tablets (50-100 mg total) by mouth 2 (two) times daily. 60 tablet 5   traZODone (DESYREL) 100 MG tablet Take 1 tablet (100 mg total) by mouth at bedtime. 30 tablet 0   warfarin (COUMADIN) 4 MG tablet Take 8 mg (2 tablets) every Monday/Wednesday/Friday and 4 mg (1 tablet) all other days or as directed by HF clinic 180 tablet 3   colchicine 0.6 MG tablet Take 1 tablet (0.6 mg total) by mouth daily. 30 tablet 6   gabapentin (NEURONTIN) 300 MG capsule Take 1 capsule (300 mg total) by mouth 3 (three) times daily. 90  capsule 6   pantoprazole (PROTONIX) 40 MG tablet Take 1 tablet (40 mg total) by mouth daily. 90 tablet 3   No facility-administered medications prior to visit.    Allergies  Allergen Reactions   Aspirin Shortness Of Breath, Itching and Rash     Burning sensation (Patient reports he tolerates other NSAIDS)    Bee Venom Hives and Swelling   Lisinopril Cough   Shrimp (Diagnostic) Rash   Tomato Rash    Review of Systems  Constitutional:  Negative for chills, fever and malaise/fatigue.  Respiratory:  Negative for cough and shortness of breath.   Cardiovascular:  Negative for chest pain, palpitations and leg swelling.  Gastrointestinal:  Negative for abdominal pain, blood in stool, constipation, diarrhea, nausea and vomiting.  Skin: Negative.   Neurological: Negative.   Psychiatric/Behavioral:  Negative for depression. The patient is not nervous/anxious.   All other systems reviewed and are negative.      Objective:    Physical Exam Vitals reviewed.  Constitutional:      General: He is not in acute distress.    Appearance: Normal appearance.  HENT:     Head: Normocephalic.  Neck:     Vascular: No carotid bruit.  Cardiovascular:     Rate and Rhythm: Normal rate and regular rhythm.     Pulses: Normal pulses.     Heart sounds: Normal heart sounds.     Comments: No obvious peripheral edema Pulmonary:     Effort: Pulmonary effort is normal.     Breath sounds: Normal breath sounds.  Musculoskeletal:  Cervical back: Normal range of motion and neck supple. No rigidity or tenderness.  Lymphadenopathy:     Cervical: No cervical adenopathy.  Skin:    General: Skin is warm and dry.     Capillary Refill: Capillary refill takes less than 2 seconds.  Neurological:     Mental Status: He is alert.  Psychiatric:        Mood and Affect: Mood normal.        Behavior: Behavior normal.        Thought Content: Thought content normal.        Judgment: Judgment normal.     BP (!)  84/58 (BP Location: Right Arm, Patient Position: Sitting, Cuff Size: Normal)   Pulse 88   Temp 98 F (36.7 C)   Ht '5\' 7"'$  (1.702 m)   Wt (!) 316 lb 3.2 oz (143.4 kg)   SpO2 97%   BMI 49.52 kg/m  Wt Readings from Last 3 Encounters:  03/16/22 (!) 316 lb 3.2 oz (143.4 kg)  02/26/22 (!) 315 lb (142.9 kg)  01/28/22 (!) 311 lb 12.8 oz (141.4 kg)    Immunization History  Administered Date(s) Administered   Influenza Split 10/24/2011   Influenza,inj,Quad PF,6+ Mos 07/23/2017, 09/06/2018, 09/18/2019   Pneumococcal Conjugate-13 11/01/2017   Pneumococcal Polysaccharide-23 10/24/2011   Tdap 04/07/2015    Diabetic Foot Exam - Simple   No data filed     Lab Results  Component Value Date   TSH 2.533 01/28/2022   Lab Results  Component Value Date   WBC 5.7 01/28/2022   HGB 15.0 01/28/2022   HCT 45.3 01/28/2022   MCV 87.1 01/28/2022   PLT 193 01/28/2022   Lab Results  Component Value Date   NA 140 02/17/2022   K 4.2 02/17/2022   CO2 23 02/17/2022   GLUCOSE 131 (H) 02/17/2022   BUN 23 (H) 02/17/2022   CREATININE 1.45 (H) 02/17/2022   BILITOT 0.7 01/28/2022   ALKPHOS 54 01/28/2022   AST 20 01/28/2022   ALT 18 01/28/2022   PROT 7.4 01/28/2022   ALBUMIN 3.6 01/28/2022   CALCIUM 9.7 02/17/2022   ANIONGAP 6 02/17/2022   GFR 58.42 (L) 01/22/2015   Lab Results  Component Value Date   CHOL 174 06/10/2021   CHOL 149 05/12/2021   CHOL 188 10/28/2020   Lab Results  Component Value Date   HDL 37 (L) 06/10/2021   HDL 31 (L) 05/12/2021   HDL 37 (L) 10/28/2020   Lab Results  Component Value Date   LDLCALC 115 (H) 06/10/2021   LDLCALC 94 05/12/2021   LDLCALC 107 (H) 10/28/2020   Lab Results  Component Value Date   TRIG 120 06/10/2021   TRIG 120 05/12/2021   TRIG 148 05/04/2021   Lab Results  Component Value Date   CHOLHDL 4.7 06/10/2021   CHOLHDL 4.8 05/12/2021   CHOLHDL 5.1 (H) 10/28/2020   Lab Results  Component Value Date   HGBA1C 5.3 03/16/2022   HGBA1C  5.2 12/08/2021   HGBA1C 5.2 12/08/2021   HGBA1C 5.2 (A) 12/08/2021   HGBA1C 5.2 12/08/2021       Assessment & Plan:   Problem List Items Addressed This Visit       Cardiovascular and Mediastinum   Hypertension associated with diabetes ([place]) (Chronic)   Relevant Medications   gabapentin (NEURONTIN) 300 MG capsule   Chronic systolic CHF (congestive heart failure) (HCC) (Chronic)   Relevant Medications   gabapentin (NEURONTIN) 300 MG capsule   Acute  on chronic systolic CHF (congestive heart failure) (HCC)   Relevant Medications   pantoprazole (PROTONIX) 40 MG tablet     Endocrine   Type 2 diabetes mellitus with stage 3 chronic kidney disease, with long-term current use of insulin (HCC) - Primary (Chronic)   Relevant Orders   HgB A1c (Completed)   Other Visit Diagnoses     On warfarin therapy       Relevant Medications   pantoprazole (PROTONIX) 40 MG tablet   Infection associated with driveline of left ventricular assist device (LVAD) (HCC)       Relevant Medications   pantoprazole (PROTONIX) 40 MG tablet   LVAD (left ventricular assist device) present (HCC)       Relevant Medications   gabapentin (NEURONTIN) 300 MG capsule   Chronic gout without tophus, unspecified cause, unspecified site       Relevant Medications   colchicine 0.6 MG tablet  Medications refilled at patient request Encouraged continued diet and exercise efforts  Encouraged continued compliance with medication   Follow up in 6 mtths for reevaluation of chronic illness, sooner as needed        I have changed Jovanie L. Rosencrans's colchicine and pantoprazole. I am also having him maintain his acetaminophen, docusate sodium, allopurinol, spironolactone, metFORMIN, amiodarone, enoxaparin, Insulin Syringe-Needle U-100, TRUEplus 5-Bevel Pen Needles, Accu-Chek FastClix Lancets, glucose blood, sertraline, isosorbide-hydrALAZINE, warfarin, diclofenac Sodium, meclizine, traMADol, sacubitril-valsartan, amLODipine,  Ozempic (0.25 or 0.5 MG/DOSE), doxazosin, insulin aspart protamine - aspart, traZODone, and gabapentin.  Meds ordered this encounter  Medications   colchicine 0.6 MG tablet    Sig: Take 1 tablet (0.6 mg total) by mouth once daily.    Dispense:  30 tablet    Refill:  6   pantoprazole (PROTONIX) 40 MG tablet    Sig: Take 1 tablet (40 mg total) by mouth once daily.    Dispense:  90 tablet    Refill:  3   gabapentin (NEURONTIN) 300 MG capsule    Sig: Take 1 capsule (300 mg total) by mouth 3 (three) times daily.    Dispense:  90 capsule    Refill:  6     Teena Dunk, NP

## 2022-03-17 ENCOUNTER — Telehealth: Payer: Self-pay | Admitting: *Deleted

## 2022-03-17 NOTE — Telephone Encounter (Signed)
Call received from [facility] asking if I had a mask sample that I can provide to the patient. He is not eligible for a new one from his insurance for another 2 months, and his current one is very worn and not usable. I do not have the one he currently has, but per Anderson Malta ok for him to come by to get that airfit P-10 to use until time to renew his supplies.

## 2022-03-17 NOTE — Progress Notes (Signed)
Remote ICD transmission.   

## 2022-03-18 ENCOUNTER — Ambulatory Visit (HOSPITAL_COMMUNITY): Payer: Self-pay | Admitting: Pharmacist

## 2022-03-18 ENCOUNTER — Ambulatory Visit (HOSPITAL_COMMUNITY)
Admission: RE | Admit: 2022-03-18 | Discharge: 2022-03-18 | Disposition: A | Discharge status: Home / Self Care | Payer: Medicaid Other | Source: Ambulatory Visit | Attending: Internal Medicine | Admitting: Internal Medicine

## 2022-03-18 ENCOUNTER — Other Ambulatory Visit (HOSPITAL_COMMUNITY): Payer: Self-pay | Admitting: *Deleted

## 2022-03-18 DIAGNOSIS — Z95811 Presence of heart assist device: Secondary | ICD-10-CM | POA: Diagnosis not present

## 2022-03-18 DIAGNOSIS — I5022 Chronic systolic (congestive) heart failure: Secondary | ICD-10-CM | POA: Diagnosis not present

## 2022-03-18 DIAGNOSIS — Z7901 Long term (current) use of anticoagulants: Secondary | ICD-10-CM | POA: Insufficient documentation

## 2022-03-18 LAB — PROTIME-INR
INR: 2.2 — ABNORMAL HIGH (ref 0.8–1.2)
Prothrombin Time: 24.3 seconds — ABNORMAL HIGH (ref 11.4–15.2)

## 2022-03-18 NOTE — Progress Notes (Signed)
LVAD INR 

## 2022-03-18 NOTE — Patient Instructions (Signed)
Closest VAD centers to [place]:   [facility] [address] [place] [address] [phone]  [place] [address] [phone]  [place] [phone] [address] [place] [address] [phone]) [phone]

## 2022-03-18 NOTE — Progress Notes (Signed)
Pt is planning on going to [facility] and [facility] June 19th - 25th. Provided him with the below closest VAD centers depending on where he is going to be:   [facility] [address] [place] [address] [phone]  [facility] [address] [place] [address] [phone]  [place] [phone] [address]  [place] [address] [phone]  [name] RN [place] Coordinator  Office: [phone]  24/7 Pager: [phone]

## 2022-03-18 NOTE — Addendum Note (Signed)
Encounter addended by: Mertha Baars, RN on: 03/18/2022 11:28 AM  Actions taken: Clinical Note Signed

## 2022-03-20 ENCOUNTER — Other Ambulatory Visit: Payer: Self-pay

## 2022-03-30 ENCOUNTER — Other Ambulatory Visit: Payer: Self-pay

## 2022-04-01 ENCOUNTER — Other Ambulatory Visit (HOSPITAL_COMMUNITY): Payer: Self-pay | Admitting: *Deleted

## 2022-04-01 DIAGNOSIS — Z7901 Long term (current) use of anticoagulants: Secondary | ICD-10-CM

## 2022-04-01 DIAGNOSIS — Z95811 Presence of heart assist device: Secondary | ICD-10-CM

## 2022-04-02 ENCOUNTER — Ambulatory Visit (HOSPITAL_COMMUNITY)
Admission: RE | Admit: 2022-04-02 | Discharge: 2022-04-02 | Disposition: A | Discharge status: Home / Self Care | Payer: Medicaid Other | Source: Ambulatory Visit | Attending: Internal Medicine | Admitting: Internal Medicine

## 2022-04-02 ENCOUNTER — Ambulatory Visit (HOSPITAL_COMMUNITY): Payer: Self-pay | Admitting: Pharmacist

## 2022-04-02 ENCOUNTER — Encounter (HOSPITAL_COMMUNITY): Payer: Self-pay

## 2022-04-02 DIAGNOSIS — M109 Gout, unspecified: Secondary | ICD-10-CM | POA: Insufficient documentation

## 2022-04-02 DIAGNOSIS — E1159 Type 2 diabetes mellitus with other circulatory complications: Secondary | ICD-10-CM

## 2022-04-02 DIAGNOSIS — N1832 Chronic kidney disease, stage 3b: Secondary | ICD-10-CM | POA: Diagnosis not present

## 2022-04-02 DIAGNOSIS — Z79899 Other long term (current) drug therapy: Secondary | ICD-10-CM | POA: Insufficient documentation

## 2022-04-02 DIAGNOSIS — I152 Hypertension secondary to endocrine disorders: Secondary | ICD-10-CM | POA: Diagnosis not present

## 2022-04-02 DIAGNOSIS — I480 Paroxysmal atrial fibrillation: Secondary | ICD-10-CM | POA: Diagnosis not present

## 2022-04-02 DIAGNOSIS — F32A Depression, unspecified: Secondary | ICD-10-CM | POA: Insufficient documentation

## 2022-04-02 DIAGNOSIS — E1122 Type 2 diabetes mellitus with diabetic chronic kidney disease: Secondary | ICD-10-CM | POA: Insufficient documentation

## 2022-04-02 DIAGNOSIS — T827XXA Infection and inflammatory reaction due to other cardiac and vascular devices, implants and grafts, initial encounter: Secondary | ICD-10-CM

## 2022-04-02 DIAGNOSIS — Z905 Acquired absence of kidney: Secondary | ICD-10-CM | POA: Diagnosis not present

## 2022-04-02 DIAGNOSIS — I130 Hypertensive heart and chronic kidney disease with heart failure and stage 1 through stage 4 chronic kidney disease, or unspecified chronic kidney disease: Secondary | ICD-10-CM | POA: Diagnosis not present

## 2022-04-02 DIAGNOSIS — E1165 Type 2 diabetes mellitus with hyperglycemia: Secondary | ICD-10-CM | POA: Insufficient documentation

## 2022-04-02 DIAGNOSIS — I428 Other cardiomyopathies: Secondary | ICD-10-CM | POA: Diagnosis not present

## 2022-04-02 DIAGNOSIS — Z7901 Long term (current) use of anticoagulants: Secondary | ICD-10-CM

## 2022-04-02 DIAGNOSIS — G4733 Obstructive sleep apnea (adult) (pediatric): Secondary | ICD-10-CM | POA: Diagnosis not present

## 2022-04-02 DIAGNOSIS — Z7182 Exercise counseling: Secondary | ICD-10-CM | POA: Diagnosis not present

## 2022-04-02 DIAGNOSIS — Z6841 Body Mass Index (BMI) 40.0 and over, adult: Secondary | ICD-10-CM | POA: Insufficient documentation

## 2022-04-02 DIAGNOSIS — Z85528 Personal history of other malignant neoplasm of kidney: Secondary | ICD-10-CM | POA: Insufficient documentation

## 2022-04-02 DIAGNOSIS — Z95811 Presence of heart assist device: Secondary | ICD-10-CM | POA: Diagnosis not present

## 2022-04-02 DIAGNOSIS — I5022 Chronic systolic (congestive) heart failure: Secondary | ICD-10-CM

## 2022-04-02 DIAGNOSIS — F419 Anxiety disorder, unspecified: Secondary | ICD-10-CM | POA: Diagnosis not present

## 2022-04-02 LAB — CBC
HCT: 42.7 % (ref 39.0–52.0)
Hemoglobin: 14.1 g/dL (ref 13.0–17.0)
MCH: 29.6 pg (ref 26.0–34.0)
MCHC: 33 g/dL (ref 30.0–36.0)
MCV: 89.5 fL (ref 80.0–100.0)
Platelets: 229 10*3/uL (ref 150–400)
RBC: 4.77 MIL/uL (ref 4.22–5.81)
RDW: 15.5 % (ref 11.5–15.5)
WBC: 6.4 10*3/uL (ref 4.0–10.5)
nRBC: 0 % (ref 0.0–0.2)

## 2022-04-02 LAB — BASIC METABOLIC PANEL
Anion gap: 7 (ref 5–15)
BUN: 23 mg/dL — ABNORMAL HIGH (ref 6–20)
CO2: 21 mmol/L — ABNORMAL LOW (ref 22–32)
Calcium: 9.6 mg/dL (ref 8.9–10.3)
Chloride: 112 mmol/L — ABNORMAL HIGH (ref 98–111)
Creatinine, Ser: 1.63 mg/dL — ABNORMAL HIGH (ref 0.61–1.24)
GFR, Estimated: 52 mL/min — ABNORMAL LOW (ref >60)
Glucose, Bld: 108 mg/dL — ABNORMAL HIGH (ref 70–99)
Potassium: 4.5 mmol/L (ref 3.5–5.1)
Sodium: 140 mmol/L (ref 135–145)

## 2022-04-02 LAB — PROTIME-INR
INR: 2 — ABNORMAL HIGH (ref 0.8–1.2)
Prothrombin Time: 22.6 seconds — ABNORMAL HIGH (ref 11.4–15.2)

## 2022-04-02 LAB — LACTATE DEHYDROGENASE: LDH: 228 U/L — ABNORMAL HIGH (ref 98–192)

## 2022-04-02 NOTE — Progress Notes (Addendum)
Patient presents for 1 mo follow up in [facility] today alone. Reports no problem with VAD or equipment.  Medications reviewed with patient. Wife fills pillbox, called her to confirm following medication changes have been made since ordered last clinic visit:   1. Increased Entresto to 97-103 mg twice daily  2. Started Amlodipine 10 mg daily  3. Started Cardura 1 mg daily  4. Started Ozempic   Patient confirms he has been getting his IV Feraheme (total of 3 doses ordered).  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 Denies any limitations.   Patient drive line dressing changed; will advance to weekly dressing changes. See note below.  Vital Signs:  Doppler Pressure: 128 Automatc BP:  115/71 (96) HR:  78 SPO2: 96% on RA   Weight: 313 lb w/ eqt Last weight:  311.8 lbs w/equt   VAD Indication: Destination Therapy due to smoking and BMI   VAD interrogation & Equipment Management: Speed: 6200 Flow: 4.9 Power: 5.2w    PI: 3.4 Hct:  45   Alarms: no clinical alarms Events: 10-20 daily  Fixed speed 6200 Low speed limit: 5900   Primary Controller:  Replace back up battery in 22 months. Back up controller:   Replace back up battery in 25 months.   Annual Equipment Maintenance on UBC/PM was performed on 07/21/21.    I reviewed the LVAD parameters from today and compared the results to the patient's prior recorded data. LVAD interrogation was NEGATIVE for significant power changes, NEGATIVE for clinical alarms and POSITIVE for PI events/speed drops. No programming changes were made and pump is functioning within specified parameters. Pt is performing daily controller and system monitor self tests along with completing weekly and monthly maintenance for LVAD equipment.   LVAD equipment check completed and is in good working order. Back-up equipment present.    Exit Site Care: Drive line is being maintained daily by [employer] his wife. Existing gauze VAD  dressing removed and site care performed using sterile technique. Drive line exit site cleaned with Chlora prep applicators x 2, allowed to dry, and Sorbaview dressing with Silverlon patch applied. Exit site healed and incorporated, the velour is fully implanted at exit site. No redness, tenderness, drainage, rash, or foul odor. Advance to weekly dressing changes. Provided with 7 daily and 8 weekly dressing kits. Advised patient if he sweats a lot, he may need to change back to gauze dressings and change as needed to keep clean and dry. Advised patient if any changes in appearance of exit site, he needs to call VAD clinic. Pt erbalized understanding of same.     Device:  Left subcutaneous dual ICD Therapies: on at 250 BPM Last check: 03/04/22   BP & Labs:  Doppler 128 - Doppler is reflecting Modified systolic   Hgb 53.2 - No S/S of bleeding. Specifically denies melena/BRBPR or nosebleeds.   LDH stable at 228 with established baseline of 160 - 300. Denies tea-colored urine. No power elevations noted on interrogation.   Plan:  No changes in medications. PharmD will call with INR results and warfarin dosing. May advance to weekly dressing changes. If dressing is getting wet with sweat, may need to switch gauze dressings.  Return to [facility] in 2 months. It will be your one year [facility] visit so be prepared to perform 6 minute walk. Also, bring your home VAD equipment Psychologist, sport and exercise and MPU) for annual maintenance.    Zada Girt RN VAD Coordinator  Office:  [phone]  24/7 Pager: [phone]

## 2022-04-02 NOTE — Progress Notes (Signed)
LVAD INR 

## 2022-04-02 NOTE — Patient Instructions (Addendum)
No changes in medications. PharmD will call with INR results and warfarin dosing. May advance to weekly dressing changes. If dressing is getting wet with sweat, may need to switch gauze dressings.  Return to [facility] in 2 months. It will be your one year In[facility] visit so be prepared to perform 6 minute walk. Also, bring your home VAD equipment Psychologist, sport and exercise and MPU) for annual maintenance.

## 2022-04-02 NOTE — Progress Notes (Signed)
VAD CLINIC Follow-up Visit  PCP: Teena Dunk, NP HF MD: DB   HPI:  Jeffrey [name] is a 46 y.o. male with a history of  poorly controlled HTN, R renal cell carcinoma s/p nephrectomy, CKD IIIa, DM2, OSA, gout, morbid obesity and systolic HF due to NICM. S/p HM-3 VAD placement 07/21/21  Has [place]-sci S-ICD   HF dates back to 2012 with EF 25%   Admitted 7/22 with CHF/cardiogenic shock in setting AF with RVR. He was placed on amio gtt and underwent cardioversion. Echo EF < 20%, severe LV dilation, moderate RV dysfunction. Creatinine peaked at 4.65 improved to 1.58 which is his baselin  Underwent HM3 LVAD by Dr. Cyndia Bent on 07/21/21. Had post-op AF requiring DC-CV.  Follow up for Heart Failure/LVAD:  Here for routine VAD f/u. Says he feels great. Recently took family vacation to [facility] and had great time. 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 Denies orthopnea or PND. No fevers, chills or problems with driveline. No bleeding, melena or neuro symptoms. No VAD alarms. Taking all meds as prescribed.      VAD Indication: Destination Therapy due to smoking and BMI   VAD interrogation & Equipment Management: Speed: 6200 Flow: 4.9 Power: 5.2w    PI: 3.4 Hct:  45   Alarms: no clinical alarms Events: 10-20 daily  Fixed speed 6200 Low speed limit: 5900   Primary Controller:  Replace back up battery in 22 months. Back up controller:   Replace back up battery in 25 months.   Annual Equipment Maintenance on UBC/PM was performed on 07/21/21.      Past Medical History:  Diagnosis Date   Anxiety    Aspirin allergy    Childhood asthma    Chronic systolic CHF (congestive heart failure) ([place])    a. EF 20-25% in 2012. b. EF 45-50% in 10/2011 with nonischemic nuc - presumed NICM. c. 12/2014 Echo: Sev depressed LV fxn, sev dil LV, mild LVH, mild MR, sev dil LA, mildly reduced RV fxn.   CKD (chronic kidney disease) stage 2, GFR 60-89 ml/min    H/O vasectomy 12/2019    High cholesterol    Hypertension    Morbid obesity ([place])    Nephrolithiasis    OSA on CPAP    Paroxysmal atrial fibrillation ([place])    Presumed NICM    a. 04/2014 Myoview: EF 26%, glob HK, sev glob HK, ? prior infarct;  b. Never cathed 2/2 CKD.   Renal cell carcinoma ([place])    a. s/p Rt robotic assisted partial converted to radical nephrectomy on 01/2013.   Troponin level elevated    a. 04/2014, 12/2014: felt due to CHF.   Type II diabetes mellitus (HCC)    Ventricular tachycardia (HCC)    a. appropriate ICD therapy 12/2017    Current Outpatient Medications  Medication Sig Dispense Refill   Accu-Chek FastClix Lancets MISC USE AS DIRECTED FOUR TIMES DAILY 102 each 11   allopurinol (ZYLOPRIM) 100 MG tablet Take 1 tablet (100 mg total) by mouth daily. 90 tablet 3   amiodarone (PACERONE) 200 MG tablet Take 1 tablet (200 mg total) by mouth daily. 30 tablet 6   amLODipine (NORVASC) 10 MG tablet Take 1 tablet (10 mg total) by mouth daily 30 tablet 6   colchicine 0.6 MG tablet Take 1 tablet (0.6 mg total) by mouth once daily. 30 tablet 6   diclofenac Sodium (VOLTAREN) 1 % GEL Apply 2 g topically 4 (  four) times daily. 100 g 1   doxazosin (CARDURA) 1 MG tablet Take 1 tablet (1 mg total) by mouth daily. 30 tablet 6   gabapentin (NEURONTIN) 300 MG capsule Take 1 capsule (300 mg total) by mouth 3 (three) times daily. 90 capsule 6   glucose blood test strip Use as directed 4 times daily. 100 each 11   insulin aspart protamine - aspart (NOVOLOG 70/30 MIX) (70-30) 100 UNIT/ML FlexPen Inject 34 Units into the skin 2 (two) times daily. 15 mL 11   Insulin Pen Needle (TRUEPLUS 5-BEVEL PEN NEEDLES) 29G X 12.7MM MISC Use as directed in the morning, at noon, and at bedtime. 100 each 3   Insulin Syringe-Needle U-100 (TRUEPLUS INSULIN SYRINGE) 31G X 5/16" 0.5 ML MISC USE TO INJECT INSULIN TWICE DAILY. 100 each 11   isosorbide-hydrALAZINE (BIDIL) 20-37.5 MG tablet Take 2 tablets by mouth 3 (three) times daily. 180  tablet 6   meclizine (ANTIVERT) 25 MG tablet Take 1 tablet (25 mg total) by mouth 3 (three) times daily as needed for dizziness. 90 tablet 2   metFORMIN (GLUCOPHAGE) 1000 MG tablet Take 1 tablet (1,000 mg total) by mouth 2 (two) times daily with a meal. 60 tablet 11   pantoprazole (PROTONIX) 40 MG tablet Take 1 tablet (40 mg total) by mouth once daily. 90 tablet 3   sacubitril-valsartan (ENTRESTO) 97-103 MG Take 1 tablet by mouth 2 (two) times daily. 60 tablet 6   Semaglutide,0.25 or 0.'5MG'$ /DOS, (OZEMPIC, 0.25 OR 0.5 MG/DOSE,) 2 MG/3ML SOPN Inject 0.'25mg'$  subcutaneously once weekly for 4 weeks, then increase to 0.'5mg'$  weekly 3 mL 1   sertraline (ZOLOFT) 100 MG tablet Take 1.5 tablets (150 mg total) by mouth daily. 45 tablet 6   spironolactone (ALDACTONE) 25 MG tablet Take 1 tablet (25 mg total) by mouth daily. 30 tablet 6   traMADol (ULTRAM) 50 MG tablet Take 1-2 tablets (50-100 mg total) by mouth 2 (two) times daily. 60 tablet 5   traZODone (DESYREL) 100 MG tablet Take 1 tablet (100 mg total) by mouth at bedtime. 30 tablet 0   warfarin (COUMADIN) 4 MG tablet Take 8 mg (2 tablets) every Monday/Wednesday/Friday and 4 mg (1 tablet) all other days or as directed by HF clinic 180 tablet 3   acetaminophen (TYLENOL) 325 MG tablet Take 2 tablets (650 mg total) by mouth every 6 (six) hours as needed for fever. (Patient not taking: Reported on 04/02/2022)     docusate sodium (COLACE) 100 MG capsule Take 1 capsule (100 mg total) by mouth 2 (two) times daily. (Patient not taking: Reported on 04/02/2022) 10 capsule 0   enoxaparin (LOVENOX) 60 MG/0.6ML injection Inject 0.6 mLs (60 mg total) into the skin every 12 (twelve) hours. (Patient not taking: Reported on 04/02/2022) 6 mL 0   No current facility-administered medications for this encounter.    Aspirin, Bee venom, Lisinopril, Shrimp (diagnostic), and Tomato  REVIEW OF SYSTEMS: All systems negative except as listed in HPI, PMH and Problem list.   Vitals:    04/02/22 1408 04/02/22 1409  BP: (!) 128/0 115/71  Pulse:  78  SpO2:  96%  Weight:  (!) 142 kg (313 lb)  Height:  '5\' 7"'$  (1.702 m)      Vital Signs:  Doppler Pressure: 128 Automatc BP:  115/71 (96) HR:  78 SPO2: 96% on RA   Weight: 313 lb w/ eqt Last weight:  311.8 lbs w/equt    Exam General:  NAD.  HEENT: normal  Neck: supple.  JVP not elevated.  Carotids 2+ bilat; no bruits. No lymphadenopathy or thryomegaly appreciated. Cor: LVAD hum.  Lungs: Clear. Abdomen: obese soft, nontender, non-distended. No hepatosplenomegaly. No bruits or masses. Good bowel sounds. Driveline site clean. Anchor in place.  Extremities: no cyanosis, clubbing, rash. Warm no edema  Neuro: alert & oriented x 3. No focal deficits. Moves all 4 without problem    ASSESSMENT AND PLAN:  .1. Chronic systolic CHF - Nonischemic CM, end-stage - EF 2012; 20-25%  - Echo  8/22: EF < 20%, severe LV dilation, moderate RV dysfunction.   - RHC (8/22): well compensated hemodynamics. - s/p S-ICD - S/p HM3 LVAD 10/17 (DT) - Doing great NYHA I. Volume looks good  - MAPs improved  - Continue Bidil 2 tabs tid - Continue spironolactone 25 mg daily  - Continue entresto to 97/130 bid - Stressed need to continue weight loss efforts (see below). Once BMI ~ 35 will refer to [facility] for begin transplant evaluation (Body mass index is 49.02 kg/m.)   2. VAD  - s/p HM-3 placement 07/21/21  -  VAD interrogated personally. Parameters stable. - INR 2.0 Goal INR 2.0-3.0 Discussed dosing with PharmD personally. - LDH 228 - DL site looks good - Hgb 14.1  3. DL site infection - resolved  4 Atrial fibrillation: Paroxysmal - Had severe AF with RVR 7/22 s/p DC-CV x 2 - Seen by EP. Not a candidate for ablation due to body habitus  (could consider AVN ablation and CRT, if needed)  - Had post-op AF with RVR post VAD - 10/23 S/P DC-CV with restoration of NSR. - Remains in NSR Continue amio 100 daily - On warfarin. INR 2.0   5.  HTN - MAPs improved. Continue current regimen  6. CKD stage 3B  - Has solitary kidney.   - Baseline creatinine is 1.5-1.8.  - Creatinine 1.63 today    7. OSA - Continue CPAP   8. DM2. Poorly controlled - On Jardiance. - Hgb A1c 9.4. -> 5.9 - PCP following  9. Polyarticular gout. - quiescent  10. Depression/anxiety - Counseled him that this is not infrequent post-op symptom. Stressed need to remain active and interact with others. - Continue Zoloft '150mg'$  daily - Much improved with coaching. Great to see  11. Morbid obesity - Body mass index is 49.02 kg/m. - continue diet and exercise - Continue GLP1RA  Total time spent 35 minutes. Over half that time spent discussing above.    Glori Bickers, MD  5:33 PM

## 2022-04-03 ENCOUNTER — Telehealth (HOSPITAL_COMMUNITY): Payer: Self-pay | Admitting: Licensed Clinical Social Worker

## 2022-04-03 NOTE — Telephone Encounter (Signed)
H&V Care Navigation CSW Progress Note  Clinical Social Worker contacted patient by phone to follow up on request for PCP.  Patient is participating in a Managed Medicaid Plan:  Yes  Patient requested VAD coordinator to assist with PCP who forwarded to social worker. CSW contacted patient to share that patient has a PCP at [facility]. Patient states he is bust at the time of call and will return call a later time. Raquel Sarna, LCSW, CCSW-MCS [phone]   SDOH Screenings   Alcohol Screen: Low Risk  (12/12/2021)   Alcohol Screen    Last Alcohol Screening Score (AUDIT): 0  Depression (PHQ2-9): Medium Risk (03/16/2022)   Depression (PHQ2-9)    PHQ-2 Score: 8  Financial Resource Strain: High Risk (12/12/2021)   Overall Financial Resource Strain (CARDIA)    Difficulty of Paying Living Expenses: Hard  Food Insecurity: Food Insecurity Present (01/12/2022)   Hunger Vital Sign    Worried About Running Out of Food in the Last Year: Sometimes true    Ran Out of Food in the Last Year: Sometimes true  Housing: Low Risk  (01/12/2022)   Housing    Last Housing Risk Score: 0  Physical Activity: Insufficiently Active (10/17/2021)   Exercise Vital Sign    Days of Exercise per Week: 3 days    Minutes of Exercise per Session: 30 min  Social Connections: Moderately Integrated (10/21/2021)   Social Connection and Isolation Panel [NHANES]    Frequency of Communication with Friends and Family: More than three times a week    Frequency of Social Gatherings with Friends and Family: More than three times a week    Attends Religious Services: 1 to 4 times per year    Active Member of [employer] or Organizations: No    Attends Archivist Meetings: Never    Marital Status: Married  Stress: No Stress Concern Present (10/21/2021)   [employer] [place]    Feeling of Stress : Only a little  Tobacco Use: Medium Risk (04/02/2022)   Patient  History    Smoking Tobacco Use: Former    Smokeless Tobacco Use: Never    Passive Exposure: Not on file  Transportation Needs: Unmet Transportation Needs (02/13/2022)   PRAPARE - Hydrologist (Medical): Yes    Lack of Transportation (Non-Medical): Yes

## 2022-04-06 ENCOUNTER — Other Ambulatory Visit: Payer: Self-pay

## 2022-04-06 ENCOUNTER — Other Ambulatory Visit (HOSPITAL_COMMUNITY): Payer: Self-pay

## 2022-04-08 ENCOUNTER — Other Ambulatory Visit: Payer: Self-pay

## 2022-04-09 ENCOUNTER — Other Ambulatory Visit: Payer: Self-pay

## 2022-04-09 ENCOUNTER — Telehealth: Payer: Self-pay | Admitting: Pharmacist

## 2022-04-09 MED ORDER — OZEMPIC (1 MG/DOSE) 4 MG/3ML ~~LOC~~ SOPN
1.0000 mg | PEN_INJECTOR | SUBCUTANEOUS | 0 refills | Status: DC
  Filled 2022-04-09: qty 3, 28d supply, fill #0

## 2022-04-09 NOTE — Addendum Note (Signed)
Addended by: Rhett Najera E on: 04/09/2022 04:55 PM   Modules accepted: Orders

## 2022-04-09 NOTE — Telephone Encounter (Signed)
Pt returned call. Has given 2 of the 0.'5mg'$  dose of Ozempic so far, tolerating well. Reports glucose has been looking good, lowest reading he's seen is 90.  Refill sent in for dose increase of Ozempic '1mg'$  weekly for pt to start after he finishes his 0.'5mg'$  pen. He's aware to keep a close eye on glucose and call if fasting glucose < 70, would need insulin dose decrease.

## 2022-04-09 NOTE — Telephone Encounter (Signed)
Called pt to follow up with Ozempic tolerability and left message. Will plan to increase to '1mg'$  weekly if tolerating well. Will also need update on glucose readings and likely further dose decrease of his Novolog 70/30 (decreased from 38u BID to 34u BID at last phone call).

## 2022-04-13 ENCOUNTER — Other Ambulatory Visit: Payer: Self-pay

## 2022-04-13 ENCOUNTER — Other Ambulatory Visit: Payer: Self-pay | Admitting: Nurse Practitioner

## 2022-04-14 ENCOUNTER — Other Ambulatory Visit: Payer: Self-pay

## 2022-04-14 NOTE — Patient Instructions (Signed)
Visit Information  Jeffrey [name] was given information about Medicaid Managed Care team care coordination services as a part of their Healthy [facility] Medicaid benefit. [name] verbally consented to engagement with the [facility] Managed Care team.   If you are experiencing a medical emergency, please call 911 or report to your local emergency department or urgent care.   If you have a non-emergency medical problem during routine business hours, please contact your provider's office and ask to speak with a nurse.   For questions related to your Healthy [facility] health plan, please call: [phone] or visit the homepage here: [email or web address]  If you would like to schedule transportation through your Healthy [facility] plan, please call the following number at least 2 days in advance of your appointment: [phone]  For information about your ride after you set it up, call Ride Assist at [phone]. Use this number to activate a Will Call pickup, or if your transportation is late for a scheduled pickup. Use this number, too, if you need to make a change or cancel a previously scheduled reservation.  If you need transportation services right away, call [phone]. The after-hours call center is staffed 24 hours to handle ride assistance and urgent reservation requests (including discharges) 365 days a year. Urgent trips include sick visits, hospital discharge requests and life-sustaining treatment.  Call the [place] at [phone], at any time, 24 hours a day, 7 days a week. If you are in danger or need immediate medical attention call 911.  If you would like help to quit smoking, call 1-800-QUIT-NOW 414-793-4027) OR Espaol: 1-855-Djelo-Ya (4-196-222-9798) o para ms informacin haga clic aqu or Text READY to 200-400 to register via text  [name] - following are the goals we discussed in your visit today:   Goals  Addressed   None       The  Patient                                              has been provided with contact information for the Managed Medicaid care management team and has been advised to call with any health related questions or concerns.   [name], BSW, [place] Managed Medicaid Team  [phone]   Following is a copy of your plan of care:  There are no care plans that you recently modified to display for this patient.

## 2022-04-14 NOTE — Patient Outreach (Signed)
Medicaid Managed Care Social Work Note  04/14/2022 Name:  Jeffrey [name] MRN:  [id number] DOB:  1976/03/10  Jeffrey [name] is an 46 y.o. year old male who is a primary patient of [place], Jake Church I, NP (Inactive).  The Medicaid Managed Care Coordination team was consulted for assistance with:  Community Resources   Mr. Presley was given information about Medicaid Managed Care Coordination team services today. Jeffrey [name] Patient agreed to services and verbal consent obtained.  Engaged with patient  for by telephone forfollow up visit in response to referral for case management and/or care coordination services.   Assessments/Interventions:  Review of past medical history, allergies, medications, health status, including review of consultants reports, laboratory and other test data, was performed as part of comprehensive evaluation and provision of chronic care management services.  SDOH: (Social Determinant of Health) assessments and interventions performed: BSW completed a telephone outreach with patient. He stated everything was going good and no resources are needed at this time.  Advanced Directives Status:  Not addressed in this encounter.  Care Plan                 Allergies  Allergen Reactions   Aspirin Shortness Of Breath, Itching and Rash     Burning sensation (Patient reports he tolerates other NSAIDS)    Bee Venom Hives and Swelling   Lisinopril Cough   Shrimp (Diagnostic) Rash   Tomato Rash    Medications Reviewed Today     Reviewed by Christinia Gully, RN (Registered Nurse) on 04/02/22 at 1127  Med List Status: <None>   Medication Order Taking? Sig Documenting Provider Last Dose Status Informant  Accu-Chek FastClix Lancets MISC [id number] Yes USE AS DIRECTED FOUR TIMES DAILY Vevelyn Francois, NP Taking Active   acetaminophen (TYLENOL) 325 MG tablet [id number] No Take 2 tablets (650 mg total) by mouth every 6 (six) hours as needed for fever.  Patient not taking: Reported on  04/02/2022   Darrick Grinder D, NP Not Taking Active Self  allopurinol (ZYLOPRIM) 100 MG tablet [id number] Yes Take 1 tablet (100 mg total) by mouth daily. Cathlyn Parsons, PA-C Taking Active Self  amiodarone (PACERONE) 200 MG tablet [id number] Yes Take 1 tablet (200 mg total) by mouth daily. Darrick Grinder D, NP Taking Active   amLODipine (NORVASC) 10 MG tablet [id number] Yes Take 1 tablet (10 mg total) by mouth daily Bensimhon, Shaune Pascal, MD Taking Active   colchicine 0.6 MG tablet [id number] Yes Take 1 tablet (0.6 mg total) by mouth once daily. Bo Merino I, NP Taking Active   diclofenac Sodium (VOLTAREN) 1 % GEL [id number] Yes Apply 2 g topically 4 (four) times daily. Izora Ribas, MD Taking Active   docusate sodium (COLACE) 100 MG capsule [id number] No Take 1 capsule (100 mg total) by mouth 2 (two) times daily.  Patient not taking: Reported on 04/02/2022   Darrick Grinder D, NP Not Taking Active Self  doxazosin (CARDURA) 1 MG tablet [id number] Yes Take 1 tablet (1 mg total) by mouth daily. Bensimhon, Shaune Pascal, MD Taking Active   enoxaparin (LOVENOX) 60 MG/0.6ML injection [id number] No Inject 0.6 mLs (60 mg total) into the skin every 12 (twelve) hours.  Patient not taking: Reported on 04/02/2022   Bensimhon, Shaune Pascal, MD Not Taking Active   gabapentin (NEURONTIN) 300 MG capsule [id number] Yes Take 1 capsule (300 mg total) by mouth 3 (three) times daily. Bo Merino I, NP Taking Active   glucose blood test strip  [id number] Yes Use as directed 4 times daily. Bo Merino I, NP Taking Active   insulin aspart protamine - aspart (NOVOLOG 70/30 MIX) (70-30) 100 UNIT/ML FlexPen [id number] Yes Inject 34 Units into the skin 2 (two) times daily. Bensimhon, Shaune Pascal, MD Taking Active   Insulin Pen Needle (TRUEPLUS 5-BEVEL PEN NEEDLES) 29G X 12.7MM MISC [id number] Yes Use as directed in the morning, at noon, and at bedtime. Bo Merino I, NP Taking Active   Insulin Syringe-Needle U-100 (TRUEPLUS INSULIN  SYRINGE) 31G X 5/16" 0.5 ML MISC [id number] Yes USE TO INJECT INSULIN TWICE DAILY. Bo Merino I, NP Taking Active   isosorbide-hydrALAZINE (BIDIL) 20-37.5 MG tablet [id number] Yes Take 2 tablets by mouth 3 (three) times daily. Bensimhon, Shaune Pascal, MD Taking Active   meclizine (ANTIVERT) 25 MG tablet [id number] Yes Take 1 tablet (25 mg total) by mouth 3 (three) times daily as needed for dizziness. Bensimhon, Shaune Pascal, MD Taking Active   metFORMIN (GLUCOPHAGE) 1000 MG tablet [id number] Yes Take 1 tablet (1,000 mg total) by mouth 2 (two) times daily with a meal. [place], Maricela Bo, FNP Taking Active Self  pantoprazole (PROTONIX) 40 MG tablet [id number] Yes Take 1 tablet (40 mg total) by mouth once daily. Bo Merino I, NP Taking Active   sacubitril-valsartan (ENTRESTO) 97-103 MG [id number] Yes Take 1 tablet by mouth 2 (two) times daily. Bensimhon, Shaune Pascal, MD Taking Active   Semaglutide,0.25 or 0.'5MG'$ /DOS, (OZEMPIC, 0.25 OR 0.5 MG/DOSE,) 2 MG/3ML SOPN [id number] Yes Inject 0.'25mg'$  subcutaneously once weekly for 4 weeks, then increase to 0.'5mg'$  weekly Bensimhon, Shaune Pascal, MD Taking Active   sertraline (ZOLOFT) 100 MG tablet [id number] Yes Take 1.5 tablets (150 mg total) by mouth daily. Bensimhon, Shaune Pascal, MD Taking Active   spironolactone (ALDACTONE) 25 MG tablet [id number] Yes Take 1 tablet (25 mg total) by mouth daily. Cathlyn Parsons, PA-C Taking Active Self  traMADol (ULTRAM) 50 MG tablet [id number] Yes Take 1-2 tablets (50-100 mg total) by mouth 2 (two) times daily. Bensimhon, Shaune Pascal, MD Taking Active   traZODone (DESYREL) 100 MG tablet [id number] Yes Take 1 tablet (100 mg total) by mouth at bedtime. Bo Merino I, NP Taking Active   warfarin (COUMADIN) 4 MG tablet [id number] Yes Take 8 mg (2 tablets) every Monday/Wednesday/Friday and 4 mg (1 tablet) all other days or as directed by HF clinic Bensimhon, Shaune Pascal, MD Taking Active            Med Note Orma Render   Mon Dec 22, 2021 11:24 AM)  4 mg every Tuesday/Saturday and 8 mg all other days            Patient Active Problem List   Diagnosis Date Noted   Presence of left ventricular assist device (LVAD) ([place]) 07/25/2021   Acute on chronic systolic CHF (congestive heart failure) ([place]) 07/18/2021   Cardiogenic shock (HCC)    Atrial fibrillation with RVR ([place]) 04/30/2021   Acute hypoxemic respiratory failure (HCC)    Right wrist pain 04/16/2021   Absolute anemia    Rectal bleeding    Iron deficiency 12/25/2020   Long term current use of anticoagulant - apixaban 12/25/2020   Paroxysmal atrial fibrillation (HCC)    Lactic acidosis 11/22/2020   Hypotension 11/22/2020   Aspiration into airway    ICD (implantable cardioverter-defibrillator) battery depletion 05/15/2020   Endotracheal tube present    Respiratory failure ([place])    Acute encephalopathy    Acute pulmonary edema (HCC)    NICM (nonischemic  cardiomyopathy) ([place]) 04/24/2020   S/P vasectomy 12/20/2019   Anxiety 12/20/2019   Hemoglobin A1C between 7% and 9% indicating borderline diabetic control ([place]) 12/20/2019   Seasonal allergies 12/20/2019   Insomnia 12/20/2019   Vasectomy evaluation 06/20/2019   Visual problems 06/20/2019   Blurry vision, bilateral 06/20/2019   Hyperglycemia 02/13/2019   Hyperosmolar (nonketotic) coma ([place]) 01/19/2019   AICD (automatic cardioverter/defibrillator) present 01/19/2019   Hyperlipidemia 12/07/2018   Follow-up exam 11/22/2018   Class 3 severe obesity due to excess calories with serious comorbidity and body mass index (BMI) of 45.0 to 49.9 in adult [facility]) 11/22/2018   Dizziness 11/22/2018   Lingular pneumonia 08/04/2018   Ventricular tachycardia ([place]) 12/30/2017   PAF (paroxysmal atrial fibrillation) ([place])    Dilated cardiomyopathy ([place])    Pollen allergy 02/17/2017   Dyslipidemia 02/17/2017   Hematochezia 11/08/2015   CKD (chronic kidney disease), stage III ([place]) 06/20/2015   Cardiac defibrillator in situ    Chronic  systolic CHF (congestive heart failure) ([place]) 03/11/2015   Gouty arthritis 03/11/2015   Chest pain 12/28/2014   Acute renal failure superimposed on stage 3 chronic kidney disease ([place]) 04/08/2014   Aspirin allergy 04/08/2014   Renal cell carcinoma ([place])    Gout attack 11/10/2011   Morbid obesity ([place]) 10/23/2011   OSA (obstructive sleep apnea) 10/23/2011   Type 2 diabetes mellitus with stage 3 chronic kidney disease, with long-term current use of insulin ([place]) 10/22/2011   Hyperlipidemia associated with type 2 diabetes mellitus ([place]) 12/25/2010   Hypertension associated with diabetes ([place]) 12/25/2010    Conditions to be addressed/monitored per PCP order:   community resources  There are no care plans that you recently modified to display for this patient.   Follow up:  Patient agrees to Care Plan and Follow-up.  Plan: The  Patient has been provided with contact information for the Managed Medicaid care management team and has been advised to call with any health related questions or concerns.    [name], BSW, [place] Managed Medicaid Team  [phone]

## 2022-04-15 ENCOUNTER — Other Ambulatory Visit: Payer: Self-pay

## 2022-04-15 MED ORDER — SPIRONOLACTONE 25 MG PO TABS
25.0000 mg | ORAL_TABLET | Freq: Every day | ORAL | 6 refills | Status: DC
  Filled 2022-04-15 – 2022-04-27 (×2): qty 30, 30d supply, fill #0

## 2022-04-20 ENCOUNTER — Ambulatory Visit (HOSPITAL_COMMUNITY)
Admission: RE | Admit: 2022-04-20 | Discharge: 2022-04-20 | Disposition: A | Discharge status: Home / Self Care | Payer: Medicaid Other | Source: Ambulatory Visit | Attending: Internal Medicine | Admitting: Internal Medicine

## 2022-04-20 ENCOUNTER — Ambulatory Visit (HOSPITAL_COMMUNITY): Payer: Self-pay | Admitting: Pharmacist

## 2022-04-20 DIAGNOSIS — Z48812 Encounter for surgical aftercare following surgery on the circulatory system: Secondary | ICD-10-CM | POA: Insufficient documentation

## 2022-04-20 DIAGNOSIS — Z95811 Presence of heart assist device: Secondary | ICD-10-CM | POA: Insufficient documentation

## 2022-04-20 DIAGNOSIS — Z7901 Long term (current) use of anticoagulants: Secondary | ICD-10-CM | POA: Insufficient documentation

## 2022-04-20 LAB — PROTIME-INR
INR: 2.8 — ABNORMAL HIGH (ref 0.8–1.2)
Prothrombin Time: 28.9 seconds — ABNORMAL HIGH (ref 11.4–15.2)

## 2022-04-21 ENCOUNTER — Other Ambulatory Visit: Payer: Self-pay

## 2022-04-27 ENCOUNTER — Other Ambulatory Visit: Payer: Self-pay

## 2022-04-27 ENCOUNTER — Other Ambulatory Visit (HOSPITAL_COMMUNITY): Payer: Self-pay | Admitting: Nurse Practitioner

## 2022-05-01 ENCOUNTER — Other Ambulatory Visit (HOSPITAL_COMMUNITY): Payer: Self-pay | Admitting: *Deleted

## 2022-05-01 DIAGNOSIS — Z95811 Presence of heart assist device: Secondary | ICD-10-CM

## 2022-05-01 DIAGNOSIS — Z7901 Long term (current) use of anticoagulants: Secondary | ICD-10-CM

## 2022-05-04 ENCOUNTER — Other Ambulatory Visit (HOSPITAL_COMMUNITY): Payer: Medicaid Other

## 2022-05-05 ENCOUNTER — Other Ambulatory Visit: Payer: Self-pay

## 2022-05-06 ENCOUNTER — Ambulatory Visit (HOSPITAL_COMMUNITY)
Admission: RE | Admit: 2022-05-06 | Discharge: 2022-05-06 | Disposition: A | Discharge status: Home / Self Care | Payer: Medicaid Other | Source: Ambulatory Visit | Attending: Cardiology | Admitting: Cardiology

## 2022-05-06 ENCOUNTER — Ambulatory Visit (HOSPITAL_COMMUNITY): Payer: Self-pay | Admitting: Pharmacist

## 2022-05-06 DIAGNOSIS — Z95811 Presence of heart assist device: Secondary | ICD-10-CM | POA: Insufficient documentation

## 2022-05-06 DIAGNOSIS — Z7901 Long term (current) use of anticoagulants: Secondary | ICD-10-CM | POA: Diagnosis not present

## 2022-05-06 LAB — PROTIME-INR
INR: 2.5 — ABNORMAL HIGH (ref 0.8–1.2)
Prothrombin Time: 26.7 seconds — ABNORMAL HIGH (ref 11.4–15.2)

## 2022-05-06 NOTE — Progress Notes (Signed)
LVAD INR 

## 2022-05-08 ENCOUNTER — Other Ambulatory Visit: Payer: Self-pay

## 2022-05-13 ENCOUNTER — Encounter (INDEPENDENT_AMBULATORY_CARE_PROVIDER_SITE_OTHER): Payer: Self-pay

## 2022-05-14 ENCOUNTER — Telehealth: Payer: Self-pay | Admitting: Pharmacist

## 2022-05-14 NOTE — Telephone Encounter (Signed)
Called pt and left message to follow up with Ozempic tolerability. If tolerating well, will increase Ozempic to '2mg'$  maintenance dose. May need decrease of his insulin depending on glucose readings, was injecting Novolog 70/30 34u BID last month.

## 2022-05-15 ENCOUNTER — Other Ambulatory Visit: Payer: Self-pay

## 2022-05-15 ENCOUNTER — Other Ambulatory Visit: Payer: Self-pay | Admitting: Nurse Practitioner

## 2022-05-15 ENCOUNTER — Other Ambulatory Visit (HOSPITAL_COMMUNITY): Payer: Self-pay | Admitting: Nurse Practitioner

## 2022-05-18 ENCOUNTER — Other Ambulatory Visit: Payer: Self-pay | Admitting: Obstetrics and Gynecology

## 2022-05-18 ENCOUNTER — Other Ambulatory Visit: Payer: Self-pay

## 2022-05-18 MED ORDER — OZEMPIC (2 MG/DOSE) 8 MG/3ML ~~LOC~~ SOPN
2.0000 mg | PEN_INJECTOR | SUBCUTANEOUS | 11 refills | Status: DC
  Filled 2022-05-18: qty 3, 28d supply, fill #0
  Filled 2022-07-06 – 2022-07-16 (×2): qty 3, 28d supply, fill #1

## 2022-05-18 NOTE — Telephone Encounter (Signed)
Spoke with pt, reports tolerating Ozempic '1mg'$  well. Hasn't had any low glucose readings but states he's been off insulin 2 weeks because the pharmacy wouldn't refill it and told him the med was discontinued? Advised him to call his PCP today and have them send in a refill for him as they've been managing his insulin and pt should not be without it. He verbalized understanding. Will also send in refill for dose increase of Ozempic '2mg'$  weekly maintenance dose. Pt aware to monitor glucose and if he notices any lows once he restarts his insulin combined with the higher Ozempic dose to let us know as his insulin dose may need further dose reduction. He was appreciative for the call.

## 2022-05-18 NOTE — Patient Outreach (Signed)
Medicaid Managed Care   Nurse Care Manager Note  05/18/2022 Name:  Jeffrey [name] MRN:  [id number] DOB:  [birth year]  Jeffrey [name] is an 46 y.o. year old male who is a primary patient of Passmore, Jake Church I, NP.  The [facility] Managed Care Coordination team was consulted for assistance with:    Chronic healthcare management needs, DM, HTN, OSA, CHF, Afib, CKD, gout, DDD, h/o renal cell carcinoma  [name] was given information about Medicaid Managed Care Coordination team services today. Jeffrey [name] Patient agreed to services and verbal consent obtained.  Engaged with patient by telephone for follow up visit in response to provider referral for case management and/or care coordination services.   Assessments/Interventions:  Review of past medical history, allergies, medications, health status, including review of consultants reports, laboratory and other test data, was performed as part of comprehensive evaluation and provision of chronic care management services.  SDOH (Social Determinants of Health) assessments and interventions performed: SDOH Interventions    Flowsheet Row Most Recent Value  SDOH Interventions   Stress Interventions Intervention Not Indicated       Care Plan  Allergies  Allergen Reactions   Aspirin Shortness Of Breath, Itching and Rash     Burning sensation (Patient reports he tolerates other NSAIDS)    Bee Venom Hives and Swelling   Lisinopril Cough   Shrimp (Diagnostic) Rash   Tomato Rash   Medications Reviewed Today     Reviewed by Gayla Medicus, RN (Registered Nurse) on 05/18/22 at 1250  Med List Status: <None>   Medication Order Taking? Sig Documenting Provider Last Dose Status Informant  Accu-Chek FastClix Lancets MISC [id number] No USE AS DIRECTED FOUR TIMES DAILY Vevelyn Francois, NP Taking Active   acetaminophen (TYLENOL) 325 MG tablet [id number] No Take 2 tablets (650 mg total) by mouth every 6 (six) hours as needed for fever.  Patient not taking:  Reported on 04/02/2022   Darrick Grinder D, NP Not Taking Active Self  allopurinol (ZYLOPRIM) 100 MG tablet [id number] No Take 1 tablet (100 mg total) by mouth daily. Cathlyn Parsons, PA-C Taking Active Self  amiodarone (PACERONE) 200 MG tablet [id number] No Take 1 tablet (200 mg total) by mouth daily. Darrick Grinder D, NP Taking Active   amLODipine (NORVASC) 10 MG tablet [id number] No Take 1 tablet (10 mg total) by mouth daily Bensimhon, Shaune Pascal, MD Taking Active   colchicine 0.6 MG tablet [id number] No Take 1 tablet (0.6 mg total) by mouth once daily. Bo Merino I, NP Taking Active   diclofenac Sodium (VOLTAREN) 1 % GEL [id number] No Apply 2 g topically 4 (four) times daily. Izora Ribas, MD Taking Active   docusate sodium (COLACE) 100 MG capsule [id number] No Take 1 capsule (100 mg total) by mouth 2 (two) times daily.  Patient not taking: Reported on 04/02/2022   Darrick Grinder D, NP Not Taking Active Self  doxazosin (CARDURA) 1 MG tablet [id number] No Take 1 tablet (1 mg total) by mouth daily. Bensimhon, Shaune Pascal, MD Taking Active   enoxaparin (LOVENOX) 60 MG/0.6ML injection [id number] No Inject 0.6 mLs (60 mg total) into the skin every 12 (twelve) hours.  Patient not taking: Reported on 04/02/2022   Bensimhon, Shaune Pascal, MD Not Taking Active   gabapentin (NEURONTIN) 300 MG capsule [id number] No Take 1 capsule (300 mg total) by mouth 3 (three) times daily. Bo Merino I, NP Taking Active   glucose blood test strip [id number] No Use as directed  4 times daily. Bo Merino I, NP Taking Active   insulin aspart protamine - aspart (NOVOLOG 70/30 MIX) (70-30) 100 UNIT/ML FlexPen [id number] No Inject 34 Units into the skin 2 (two) times daily. Bensimhon, Shaune Pascal, MD Taking Active   Insulin Pen Needle (TRUEPLUS 5-BEVEL PEN NEEDLES) 29G X 12.7MM MISC [id number] No Use as directed in the morning, at noon, and at bedtime. Bo Merino I, NP Taking Active   Insulin Syringe-Needle U-100 (TRUEPLUS INSULIN  SYRINGE) 31G X 5/16" 0.5 ML MISC [id number] No USE TO INJECT INSULIN TWICE DAILY. Bo Merino I, NP Taking Active   isosorbide-hydrALAZINE (BIDIL) 20-37.5 MG tablet [id number] No Take 2 tablets by mouth 3 (three) times daily. Bensimhon, Shaune Pascal, MD Taking Active   meclizine (ANTIVERT) 25 MG tablet [id number] No Take 1 tablet (25 mg total) by mouth 3 (three) times daily as needed for dizziness. Bensimhon, Shaune Pascal, MD Taking Active   metFORMIN (GLUCOPHAGE) 1000 MG tablet [id number] No Take 1 tablet (1,000 mg total) by mouth 2 (two) times daily with a meal. [place], Maricela Bo, FNP Taking Active Self  pantoprazole (PROTONIX) 40 MG tablet [id number] No Take 1 tablet (40 mg total) by mouth once daily. Bo Merino I, NP Taking Active   sacubitril-valsartan (ENTRESTO) 97-103 MG [id number] No Take 1 tablet by mouth 2 (two) times daily. Bensimhon, Shaune Pascal, MD Taking Active   Semaglutide, 1 MG/DOSE, (OZEMPIC, 1 MG/DOSE,) 4 MG/3ML SOPN [id number]  Inject 1 mg into the skin once a week. Bensimhon, Shaune Pascal, MD  Active   sertraline (ZOLOFT) 100 MG tablet [id number] No Take 1.5 tablets (150 mg total) by mouth daily. Bensimhon, Shaune Pascal, MD Taking Active   spironolactone (ALDACTONE) 25 MG tablet [id number]  Take 1 tablet (25 mg total) by mouth daily. Dorena Dew, FNP  Active   traMADol Veatrice Bourbon) 50 MG tablet [id number] No Take 1-2 tablets (50-100 mg total) by mouth 2 (two) times daily. Bensimhon, Shaune Pascal, MD Taking Active   traZODone (DESYREL) 100 MG tablet [id number] No Take 1 tablet (100 mg total) by mouth at bedtime. Bo Merino I, NP Taking Active   warfarin (COUMADIN) 4 MG tablet [id number] No Take 8 mg (2 tablets) every Monday/Wednesday/Friday and 4 mg (1 tablet) all other days or as directed by HF clinic Bensimhon, Shaune Pascal, MD Taking Active            Med Note Orma Render   Mon Dec 22, 2021 11:24 AM) 4 mg every Tuesday/Saturday and 8 mg all other days           Patient Active Problem List    Diagnosis Date Noted   Presence of left ventricular assist device (LVAD) ([facility]) 07/25/2021   Acute on chronic systolic CHF (congestive heart failure) ([place]) 07/18/2021   Cardiogenic shock (HCC)    Atrial fibrillation with RVR ([place]) 04/30/2021   Acute hypoxemic respiratory failure (HCC)    Right wrist pain 04/16/2021   Absolute anemia    Rectal bleeding    Iron deficiency 12/25/2020   Long term current use of anticoagulant - apixaban 12/25/2020   Paroxysmal atrial fibrillation (HCC)    Lactic acidosis 11/22/2020   Hypotension 11/22/2020   Aspiration into airway    ICD (implantable cardioverter-defibrillator) battery depletion 05/15/2020   Endotracheal tube present    Respiratory failure ([place])    Acute encephalopathy    Acute pulmonary edema (HCC)    NICM (nonischemic cardiomyopathy) ([place]) 04/24/2020   S/P vasectomy 12/20/2019   Anxiety 12/20/2019  Hemoglobin A1C between 7% and 9% indicating borderline diabetic control ([place]) 12/20/2019   Seasonal allergies 12/20/2019   Insomnia 12/20/2019   Vasectomy evaluation 06/20/2019   Visual problems 06/20/2019   Blurry vision, bilateral 06/20/2019   Hyperglycemia 02/13/2019   Hyperosmolar (nonketotic) coma ([place]) 01/19/2019   AICD (automatic cardioverter/defibrillator) present 01/19/2019   Hyperlipidemia 12/07/2018   Follow-up exam 11/22/2018   Class 3 severe obesity due to excess calories with serious comorbidity and body mass index (BMI) of 45.0 to 49.9 in adult [facility]) 11/22/2018   Dizziness 11/22/2018   Lingular pneumonia 08/04/2018   Ventricular tachycardia ([place]) 12/30/2017   PAF (paroxysmal atrial fibrillation) ([place])    Dilated cardiomyopathy ([place])    Pollen allergy 02/17/2017   Dyslipidemia 02/17/2017   Hematochezia 11/08/2015   CKD (chronic kidney disease), stage III ([place]) 06/20/2015   Cardiac defibrillator in situ    Chronic systolic CHF (congestive heart failure) ([place]) 03/11/2015   Gouty arthritis 03/11/2015   Chest pain  12/28/2014   Acute renal failure superimposed on stage 3 chronic kidney disease ([place]) 04/08/2014   Aspirin allergy 04/08/2014   Renal cell carcinoma ([place])    Gout attack 11/10/2011   Morbid obesity ([place]) 10/23/2011   OSA (obstructive sleep apnea) 10/23/2011   Type 2 diabetes mellitus with stage 3 chronic kidney disease, with long-term current use of insulin ([place]) 10/22/2011   Hyperlipidemia associated with type 2 diabetes mellitus ([place]) 12/25/2010   Hypertension associated with diabetes ([place]) 12/25/2010   Conditions to be addressed/monitored per PCP order:  Chronic healthcare management needs, DM, HTN, OSA, CHF, Afib, CKD, gout, DDD, h/o renal cell carcinoma, DLD  Care Plan : General Plan of Care (Adult)  Updates made by Gayla Medicus, RN since 05/18/2022 12:00 AM     Problem: Health Promotion or Disease Self-Management (General Plan of Care)   Priority: High  Onset Date: 08/18/2022  Note:   Current Barriers:  Chronic Disease Management support and education needs, S/P LVAD 07/21/21 05/18/22:  Patient with cough and hoarseness today-for 2.5 days, no fever-to contact PCP for f/u.  Patient with no other complaint today.  Awaiting disability determination-90% complete.  Continues to do dressing changes once a week now.  BP WNL per patient and Hgb A1C 5.3%.  Nurse Case Manager Clinical Goal(s):  Over the next 90 days, patient will attend all scheduled medical appointments: Over the next 30 days, patient wil attend HF and LVAD support group Over the next 30 days, patient will have dental appt scheduled  Interventions:  Inter-disciplinary care team collaboration (see longitudinal plan of care) Collaborated with BSW regarding food stamps and dental resources. BSW referral for food stamp updates and dental resources-completed Evaluation of current treatment plan and patient's adherence to plan as established by provider.  BSW contacted patient regarding daycare voucher and foodstamps. BSW  informed patient he would have to contact DSS and have the child's name placed on the waitlist, patient can also apply for foodstamps at the same time. Patient reports no other resources needed at this time.  09/04/21: BSW spoke with patient, he stated he is not sure if they have been placed on the daycare voucher because his wife has not said anything to him yet. Patient stated they have applied for foodstamps but have not received them, he thinks its because his wife makes to much money. No other resources are needed at this time. 10/22/21: Bsw completed a follow up telephone call with patient regarding dental resources and follow up with his  foodstamps. Patient stated he would like for dental resources to be mailed to him. Patient asked BSW if she could provide an update on his foodstamp application, BSW informed patient he would have to contact DSS call center or his worker to get an update on his foodstamp application. No other resources are needed at this time. 12/15/21: BSW completed telephone outreach with patient. He states he did not receieve the resources for dental. BSW will resend letter. BSW informed patient it may benefit him to going up to DSS to inquire about his foodstamp application. Sometimes it is easier to go up there. Patient stated he was going to try the group session for now and is not interested in therapy, BSW did provide him with the information for [facility].No other resources are needed at this time. 01/02/22: BSW completed telephone outreach with patient. He states he did receive the letter with the resources and is waiting on call backs. Patient states he has not made it down to DSS yet to check on his foodstamp application. No other resources are needed at this time.  03/12/22: BSW competed telephone outreach with patient's wife. She stated they were able to get a daycare voucher and they did apply for foodstamps and she has to turn in her check stubs. She stated no other resources are  needed at this time. Reviewed medications with patient. Discussed plans with patient for ongoing care management follow up and provided patient with direct contact information for care management team Provided patient with educational materials related to exercise. Reviewed scheduled/upcoming provider appointments. Collaborated with Pharmacy Pharmacy referral for medication review  Patient Goals/Self-Care Activities Over the next 90 days, patient will:  -Attends all scheduled provider appointments Calls provider office for new concerns or questions  Follow Up Plan: The Managed Medicaid care management team will reach out to the patient again over the next 30 business  days.  The patient has been provided with contact information for the Managed Medicaid care management team and has been advised to call with any health related questions or concerns.    Follow Up:  Patient agrees to Care Plan and Follow-up.  Plan: The Managed Medicaid care management team will reach out to the patient again over the next 30 business  days. and The  Patient has been provided with contact information for the Managed Medicaid care management team and has been advised to call with any health related questions or concerns.  Date/time of next scheduled RN care management/care coordination outreach:  06/26/22 at 1030

## 2022-05-18 NOTE — Addendum Note (Signed)
Addended by: Ramona Slinger E on: 05/18/2022 01:28 PM   Modules accepted: Orders

## 2022-05-18 NOTE — Patient Instructions (Signed)
Hi Jeffrey [name], thanks for speaking with me-I hope your cough gets better soon.  Jeffrey [name] was given information about Medicaid Managed Care team care coordination services as a part of their Healthy [facility] Medicaid benefit. Jeffrey [name] verbally consented to engagement with the [facility] Managed Care team.   If you are experiencing a medical emergency, please call 911 or report to your local emergency department or urgent care.   If you have a non-emergency medical problem during routine business hours, please contact your provider's office and ask to speak with a nurse.   For questions related to your Healthy [facility] health plan, please call: [phone] or visit the homepage here: [email or web address]  If you would like to schedule transportation through your Healthy [facility] plan, please call the following number at least 2 days in advance of your appointment: [phone]  For information about your ride after you set it up, call Ride Assist at [phone]. Use this number to activate a Will Call pickup, or if your transportation is late for a scheduled pickup. Use this number, too, if you need to make a change or cancel a previously scheduled reservation.  If you need transportation services right away, call [phone]. The after-hours call center is staffed 24 hours to handle ride assistance and urgent reservation requests (including discharges) 365 days a year. Urgent trips include sick visits, hospital discharge requests and life-sustaining treatment.  Call the [place] at [phone], at any time, 24 hours a day, 7 days a week. If you are in danger or need immediate medical attention call 911.  If you would like help to quit smoking, call 1-800-QUIT-NOW 386 682 3534) OR Espaol: 1-855-Djelo-Ya (2-774-128-7867) o para ms informacin haga clic aqu or Text READY to 200-400 to register via text  Jeffrey [name] - following are  the goals we discussed in your visit today:   Goals Addressed             This Visit's Progress    Protect My Health       Timeframe:  Long-Range Goal Priority:  High Start Date:    09/24/20                         Expected End Date:  ongoing             - schedule recommended health tests  - schedule and keep appointment for annual check-up  05/18/22:  Has appointment at [facility] 06/02/22   Patient verbalizes understanding of instructions and care plan provided today and agrees to view in [place]. Active MyChart status and patient understanding of how to access instructions and care plan via MyChart confirmed with patient.     The Managed Medicaid care management team will reach out to the patient again over the next 30 business  days.  The  Patient  has been provided with contact information for the Managed Medicaid care management team and has been advised to call with any health related questions or concerns.   [name] RN, BSN [place] Management Coordinator - Managed Medicaid High Risk [phone]   Following is a copy of your plan of care:  Care Plan : General Plan of Care (Adult)  Updates made by Gayla Medicus, RN since 05/18/2022 12:00 AM     Problem: Health Promotion or Disease Self-Management (General Plan of Care)   Priority: High  Onset Date: 08/18/2022  Note:  Current Barriers:  Chronic Disease Management support and education needs, S/P LVAD 07/21/21 05/18/22:  Patient with cough and hoarseness today-for 2.5 days, no fever-to contact PCP for f/u.  Patient with no other complaint today.  Awaiting disability determination-90% complete.  Continues to do dressing changes once a week now.  BP WNL per patient and Hgb A1C 5.3%.  Nurse Case Manager Clinical Goal(s):  Over the next 90 days, patient will attend all scheduled medical appointments: Over the next 30 days, patient wil attend HF and LVAD support group Over the next  30 days, patient will have dental appt scheduled  Interventions:  Inter-disciplinary care team collaboration (see longitudinal plan of care) Collaborated with BSW regarding food stamps and dental resources. BSW referral for food stamp updates and dental resources-completed Evaluation of current treatment plan and patient's adherence to plan as established by provider.  BSW contacted patient regarding daycare voucher and foodstamps. BSW informed patient he would have to contact DSS and have the child's name placed on the waitlist, patient can also apply for foodstamps at the same time. Patient reports no other resources needed at this time.  09/04/21: BSW spoke with patient, he stated he is not sure if they have been placed on the daycare voucher because his wife has not said anything to him yet. Patient stated they have applied for foodstamps but have not received them, he thinks its because his wife makes to much money. No other resources are needed at this time. 10/22/21: Bsw completed a follow up telephone call with patient regarding dental resources and follow up with his foodstamps. Patient stated he would like for dental resources to be mailed to him. Patient asked BSW if she could provide an update on his foodstamp application, BSW informed patient he would have to contact DSS call center or his worker to get an update on his foodstamp application. No other resources are needed at this time. 12/15/21: BSW completed telephone outreach with patient. He states he did not receieve the resources for dental. BSW will resend letter. BSW informed patient it may benefit him to going up to DSS to inquire about his foodstamp application. Sometimes it is easier to go up there. Patient stated he was going to try the group session for now and is not interested in therapy, BSW did provide him with the information for [facility].No other resources are needed at this time. 01/02/22: BSW completed telephone outreach with  patient. He states he did receive the letter with the resources and is waiting on call backs. Patient states he has not made it down to DSS yet to check on his foodstamp application. No other resources are needed at this time.  03/12/22: BSW competed telephone outreach with patient's wife. She stated they were able to get a daycare voucher and they did apply for foodstamps and she has to turn in her check stubs. She stated no other resources are needed at this time. Reviewed medications with patient. Discussed plans with patient for ongoing care management follow up and provided patient with direct contact information for care management team Provided patient with educational materials related to exercise. Reviewed scheduled/upcoming provider appointments. Collaborated with Pharmacy Pharmacy referral for medication review  Patient Goals/Self-Care Activities Over the next 90 days, patient will:  -Attends all scheduled provider appointments Calls provider office for new concerns or questions  Follow Up Plan: The Managed Medicaid care management team will reach out to the patient again over the next 30 business  days.  The  patient has been provided with contact information for the Managed Medicaid care management team and has been advised to call with any health related questions or concerns.

## 2022-05-18 NOTE — Telephone Encounter (Signed)
2nd message left for pt.

## 2022-05-19 ENCOUNTER — Other Ambulatory Visit: Payer: Self-pay

## 2022-05-19 ENCOUNTER — Encounter (HOSPITAL_COMMUNITY): Payer: Self-pay | Admitting: Pharmacy Technician

## 2022-05-19 ENCOUNTER — Emergency Department (HOSPITAL_COMMUNITY)
Admission: EM | Admit: 2022-05-19 | Discharge: 2022-05-19 | Disposition: A | Discharge status: Home / Self Care | Payer: Medicaid Other | Attending: Emergency Medicine | Admitting: Emergency Medicine

## 2022-05-19 ENCOUNTER — Emergency Department (HOSPITAL_COMMUNITY): Payer: Medicaid Other

## 2022-05-19 DIAGNOSIS — I509 Heart failure, unspecified: Secondary | ICD-10-CM | POA: Diagnosis not present

## 2022-05-19 DIAGNOSIS — Z95811 Presence of heart assist device: Secondary | ICD-10-CM | POA: Insufficient documentation

## 2022-05-19 DIAGNOSIS — I130 Hypertensive heart and chronic kidney disease with heart failure and stage 1 through stage 4 chronic kidney disease, or unspecified chronic kidney disease: Secondary | ICD-10-CM | POA: Insufficient documentation

## 2022-05-19 DIAGNOSIS — E860 Dehydration: Secondary | ICD-10-CM

## 2022-05-19 DIAGNOSIS — R0789 Other chest pain: Secondary | ICD-10-CM | POA: Insufficient documentation

## 2022-05-19 DIAGNOSIS — Z7901 Long term (current) use of anticoagulants: Secondary | ICD-10-CM | POA: Insufficient documentation

## 2022-05-19 DIAGNOSIS — N189 Chronic kidney disease, unspecified: Secondary | ICD-10-CM | POA: Insufficient documentation

## 2022-05-19 DIAGNOSIS — R778 Other specified abnormalities of plasma proteins: Secondary | ICD-10-CM | POA: Diagnosis not present

## 2022-05-19 DIAGNOSIS — R079 Chest pain, unspecified: Secondary | ICD-10-CM | POA: Diagnosis not present

## 2022-05-19 DIAGNOSIS — N179 Acute kidney failure, unspecified: Secondary | ICD-10-CM

## 2022-05-19 LAB — CBC WITH DIFFERENTIAL/PLATELET
Abs Immature Granulocytes: 0.04 10*3/uL (ref 0.00–0.07)
Basophils Absolute: 0.1 10*3/uL (ref 0.0–0.1)
Basophils Relative: 1 %
Eosinophils Absolute: 0.2 10*3/uL (ref 0.0–0.5)
Eosinophils Relative: 3 %
HCT: 48.6 % (ref 39.0–52.0)
Hemoglobin: 17.1 g/dL — ABNORMAL HIGH (ref 13.0–17.0)
Immature Granulocytes: 1 %
Lymphocytes Relative: 24 %
Lymphs Abs: 1.9 10*3/uL (ref 0.7–4.0)
MCH: 30.1 pg (ref 26.0–34.0)
MCHC: 35.2 g/dL (ref 30.0–36.0)
MCV: 85.6 fL (ref 80.0–100.0)
Monocytes Absolute: 0.8 10*3/uL (ref 0.1–1.0)
Monocytes Relative: 9 %
Neutro Abs: 5.1 10*3/uL (ref 1.7–7.7)
Neutrophils Relative %: 62 %
Platelets: 260 10*3/uL (ref 150–400)
RBC: 5.68 MIL/uL (ref 4.22–5.81)
RDW: 15.4 % (ref 11.5–15.5)
WBC: 8.1 10*3/uL (ref 4.0–10.5)
nRBC: 0 % (ref 0.0–0.2)

## 2022-05-19 LAB — PROTIME-INR
INR: 2.3 — ABNORMAL HIGH (ref 0.8–1.2)
Prothrombin Time: 25.1 seconds — ABNORMAL HIGH (ref 11.4–15.2)

## 2022-05-19 LAB — TROPONIN I (HIGH SENSITIVITY)
Troponin I (High Sensitivity): 242 ng/L (ref <18)
Troponin I (High Sensitivity): 263 ng/L (ref <18)

## 2022-05-19 LAB — BASIC METABOLIC PANEL
Anion gap: 11 (ref 5–15)
BUN: 48 mg/dL — ABNORMAL HIGH (ref 6–20)
CO2: 20 mmol/L — ABNORMAL LOW (ref 22–32)
Calcium: 9.9 mg/dL (ref 8.9–10.3)
Chloride: 105 mmol/L (ref 98–111)
Creatinine, Ser: 2.53 mg/dL — ABNORMAL HIGH (ref 0.61–1.24)
GFR, Estimated: 31 mL/min — ABNORMAL LOW (ref >60)
Glucose, Bld: 130 mg/dL — ABNORMAL HIGH (ref 70–99)
Potassium: 3.9 mmol/L (ref 3.5–5.1)
Sodium: 136 mmol/L (ref 135–145)

## 2022-05-19 LAB — LACTATE DEHYDROGENASE: LDH: 210 U/L — ABNORMAL HIGH (ref 98–192)

## 2022-05-19 LAB — BRAIN NATRIURETIC PEPTIDE: B Natriuretic Peptide: 47 pg/mL (ref 0.0–100.0)

## 2022-05-19 MED ORDER — LACTATED RINGERS IV BOLUS
1000.0000 mL | Freq: Once | INTRAVENOUS | Status: AC
  Administered 2022-05-19: 1000 mL via INTRAVENOUS

## 2022-05-19 NOTE — Progress Notes (Signed)
LVAD Coordinator ED Encounter  Jeffrey [name] a 46 y.o. malethat presented to [facility] ER today due to chest discomfort. He has a past medical history  has a past medical history of Anxiety, Aspirin allergy, Childhood asthma, Chronic systolic CHF (congestive heart failure) ([place]), CKD (chronic kidney disease) stage 2, GFR 60-89 ml/min, H/O vasectomy (12/2019), High cholesterol, Hypertension, Morbid obesity ([place]), Nephrolithiasis, OSA on CPAP, Paroxysmal atrial fibrillation ([place]), Presumed NICM, Renal cell carcinoma ([place]), Troponin level elevated, Type II diabetes mellitus ([place]), and Ventricular tachycardia ([place]).Jeffrey [name]   LVAD is a HM III and was implanted on 07/21/21 by Dr Cyndia Bent.  Patient seen in ED waiting room. Complaining of cough for several days that led to chest discomfort. Patient states he believed he had fluid on board and admitted to taking two days worth of Torsemide PRN before running out. Patient is alert and oriented. No pitting edema in extremities on assessment. Patient admitted to room in ED and LVAD interrogated showing significant PI events. Suction events noted. Ellen Henri PA saw patient at the bedside and  cased discussed Dr [name]. Patient will receive fluids and interrogation of [employer] while in ED. Patient instructed to call VAD Clinic if still feeling poorly within the next few days.  Vital signs: HR: 85 Doppler MAP:  Automated BP:97/74 (83) O2 Sat: 98% RA  LVAD interrogation reveals:  Speed: 5950 Flow: 4.7 Power:  5 PI: 3.5  Alarms: none  Drive Line: CDI. Anchor in place.   Updated VAD Providers Dr [name] and Ellen Henri PA about the above. No LVAD issues and pump is functioning as expected. Able to independently manage LVAD equipment. No LVAD needs at this time.    Bobbye Morton, RN VAD Coordinator   Office: [phone] 24/7 Emergency VAD Pager: [phone]

## 2022-05-19 NOTE — ED Provider Notes (Addendum)
[facility] EMERGENCY DEPARTMENT Provider Note   CSN: [id number] Arrival date & time: 05/19/22  1417     History  Chief Complaint  Patient presents with   Chest Pain    Jeffrey [name] is a 46 y.o. male.   Chest Pain    46 year old male with a medical history significant for morbid obesity, hypertension, nonischemic cardiomyopathy, CHF, LVAD dependent who presents to the emergency department with nonspecific chest discomfort.  He denies any radiation.  Denies any shortness of breath.  He felt that he had had some fluid on his lungs and so admitted to taking 2 doses of torsemide in the past few days.  He was evaluated bedside by the LVAD coordinator and heart failure team and it was felt that he was dehydrated.  Denies any other complaints at this time and is well-appearing.  Home Medications Prior to Admission medications   Medication Sig Start Date End Date Taking? Authorizing Provider  allopurinol (ZYLOPRIM) 100 MG tablet Take 1 tablet (100 mg total) by mouth daily. 05/27/21  Yes Angiulli, Lavon Paganini, PA-C  amLODipine (NORVASC) 10 MG tablet Take 1 tablet (10 mg total) by mouth daily 02/02/22 08/31/22 Yes Bensimhon, Shaune Pascal, MD  colchicine 0.6 MG tablet Take 1 tablet (0.6 mg total) by mouth once daily. 03/16/22  Yes Passmore, Jake Church I, NP  diclofenac Sodium (VOLTAREN) 1 % GEL Apply 2 g topically 4 (four) times daily. Patient taking differently: Apply 2 g topically 4 (four) times daily as needed (pain). 11/27/21  Yes Raulkar, Clide Deutscher, MD  doxazosin (CARDURA) 1 MG tablet Take 1 tablet (1 mg total) by mouth daily. 02/09/22  Yes Bensimhon, Shaune Pascal, MD  gabapentin (NEURONTIN) 300 MG capsule Take 1 capsule (300 mg total) by mouth 3 (three) times daily. Patient taking differently: Take 300 mg by mouth 2 (two) times daily. 03/16/22  Yes Passmore, Tewana I, NP  isosorbide-hydrALAZINE (BIDIL) 20-37.5 MG tablet Take 2 tablets by mouth 3 (three) times daily. Patient taking  differently: Take 2 tablets by mouth in the morning and at bedtime. 11/24/21  Yes Bensimhon, Shaune Pascal, MD  meclizine (ANTIVERT) 25 MG tablet Take 1 tablet (25 mg total) by mouth 3 (three) times daily as needed for dizziness. 12/22/21  Yes Bensimhon, Shaune Pascal, MD  metFORMIN (GLUCOPHAGE) 1000 MG tablet Take 1 tablet (1,000 mg total) by mouth 2 (two) times daily with a meal. 06/03/21  Yes Milford, Jessica M, FNP  pantoprazole (PROTONIX) 40 MG tablet Take 1 tablet (40 mg total) by mouth once daily. 03/16/22  Yes Passmore, Jake Church I, NP  sacubitril-valsartan (ENTRESTO) 97-103 MG Take 1 tablet by mouth 2 (two) times daily. 01/28/22  Yes Bensimhon, Shaune Pascal, MD  Semaglutide, 2 MG/DOSE, (OZEMPIC, 2 MG/DOSE,) 8 MG/3ML SOPN Inject 2 mg into the skin once a week. 05/18/22  Yes Bensimhon, Shaune Pascal, MD  sertraline (ZOLOFT) 100 MG tablet Take 1.5 tablets (150 mg total) by mouth daily. 11/24/21  Yes Bensimhon, Shaune Pascal, MD  spironolactone (ALDACTONE) 25 MG tablet Take 1 tablet (25 mg total) by mouth daily. 04/15/22 11/11/22 Yes Dorena Dew, FNP  traZODone (DESYREL) 100 MG tablet Take 1 tablet (100 mg total) by mouth at bedtime. 03/03/22  Yes Passmore, Jake Church I, NP  warfarin (COUMADIN) 4 MG tablet Take 8 mg (2 tablets) every Monday/Wednesday/Friday and 4 mg (1 tablet) all other days or as directed by HF clinic Patient taking differently: Take 4-8 mg by mouth as directed. Take 1 tablet (4 mg)  every (Tuesday & Saturday) & Take 2 tablets (8 mg) all other days or as directed by HF clinic 11/24/21  Yes Bensimhon, Shaune Pascal, MD  Accu-Chek FastClix Lancets MISC USE AS DIRECTED FOUR TIMES DAILY 08/21/21 08/21/22  Vevelyn Francois, NP  acetaminophen (TYLENOL) 325 MG tablet Take 2 tablets (650 mg total) by mouth every 6 (six) hours as needed for fever. Patient not taking: Reported on 04/02/2022 05/22/21   Darrick Grinder D, NP  amiodarone (PACERONE) 200 MG tablet Take 1 tablet (200 mg total) by mouth daily. Patient not taking: Reported on  05/19/2022 08/08/21   Darrick Grinder D, NP  docusate sodium (COLACE) 100 MG capsule Take 1 capsule (100 mg total) by mouth 2 (two) times daily. Patient not taking: Reported on 04/02/2022 05/22/21   Darrick Grinder D, NP  enoxaparin (LOVENOX) 60 MG/0.6ML injection Inject 0.6 mLs (60 mg total) into the skin every 12 (twelve) hours. Patient not taking: Reported on 04/02/2022 08/13/21   Bensimhon, Shaune Pascal, MD  glucose blood test strip Use as directed 4 times daily. 08/25/21   Passmore, Jake Church I, NP  Insulin Pen Needle (TRUEPLUS 5-BEVEL PEN NEEDLES) 29G X 12.7MM MISC Use as directed in the morning, at noon, and at bedtime. 08/14/21   Bo Merino I, NP  Insulin Syringe-Needle U-100 (TRUEPLUS INSULIN SYRINGE) 31G X 5/16" 0.5 ML MISC USE TO INJECT INSULIN TWICE DAILY. 08/14/21 08/14/22  Passmore, Jake Church I, NP  traMADol (ULTRAM) 50 MG tablet Take 1-2 tablets (50-100 mg total) by mouth 2 (two) times daily. Patient not taking: Reported on 05/19/2022 12/22/21   Bensimhon, Shaune Pascal, MD      Allergies    Aspirin, Bee venom, Lisinopril, Shrimp (diagnostic), and Tomato    Review of Systems   Review of Systems  Cardiovascular:  Positive for chest pain.  All other systems reviewed and are negative.   Physical Exam Updated Vital Signs BP (!) 128/92   Pulse (!) 57   Temp 98.6 F (37 C) (Oral)   Resp (!) 26   SpO2 96%  Physical Exam Vitals and nursing note reviewed.  Constitutional:      General: He is not in acute distress. HENT:     Head: Normocephalic and atraumatic.  Eyes:     Conjunctiva/sclera: Conjunctivae normal.     Pupils: Pupils are equal, round, and reactive to light.  Cardiovascular:     Rate and Rhythm: Normal rate and regular rhythm.     Comments: Whirring murmur of LVAD present Pulmonary:     Effort: Pulmonary effort is normal. No respiratory distress.     Breath sounds: Normal breath sounds.  Abdominal:     General: There is no distension.     Tenderness: There is no guarding.   Musculoskeletal:        General: No deformity or signs of injury.     Cervical back: Neck supple.  Skin:    Findings: No lesion or rash.  Neurological:     General: No focal deficit present.     Mental Status: He is alert. Mental status is at baseline.     ED Results / Procedures / Treatments   Labs (all labs ordered are listed, but only abnormal results are displayed) Labs Reviewed  CBC WITH DIFFERENTIAL/PLATELET - Abnormal; Notable for the following components:      Result Value   Hemoglobin 17.1 (*)    All other components within normal limits  BASIC METABOLIC PANEL - Abnormal; Notable for the following components:  CO2 20 (*)    Glucose, Bld 130 (*)    BUN 48 (*)    Creatinine, Ser 2.53 (*)    GFR, Estimated 31 (*)    All other components within normal limits  PROTIME-INR - Abnormal; Notable for the following components:   Prothrombin Time 25.1 (*)    INR 2.3 (*)    All other components within normal limits  LACTATE DEHYDROGENASE - Abnormal; Notable for the following components:   LDH 210 (*)    All other components within normal limits  TROPONIN I (HIGH SENSITIVITY) - Abnormal; Notable for the following components:   Troponin I (High Sensitivity) 242 (*)    All other components within normal limits  TROPONIN I (HIGH SENSITIVITY) - Abnormal; Notable for the following components:   Troponin I (High Sensitivity) 263 (*)    All other components within normal limits  BRAIN NATRIURETIC PEPTIDE    EKG EKG Interpretation  Date/Time:  Tuesday May 19 2022 14:42:04 EDT Ventricular Rate:  100 PR Interval:  108 QRS Duration: 82 QT Interval:  398 QTC Calculation: 513 R Axis:   177 Text Interpretation: Sinus rhythm with short PR with Premature supraventricular complexes and with occasional Premature ventricular complexes Possible Right ventricular hypertrophy Inferior infarct , age undetermined Anterolateral infarct , age undetermined Abnormal ECG When compared with  ECG of 22-Jul-2021 07:19, PREVIOUS ECG IS PRESENT Confirmed by Regan Lemming ([id number]) on 05/19/2022 6:43:22 PM  Radiology DG Chest 2 View  Result Date: 05/19/2022 CLINICAL DATA:  Chest pain EXAM: CHEST - 2 VIEW COMPARISON:  Chest x-ray dated July 25, 2021 FINDINGS: Cardiac and mediastinal contours are unchanged. Prior median sternotomy. LVAD device in place. Left chest wall single lead subcutaneous ICD. Lungs are clear. No pleural effusion pneumothorax. IMPRESSION: No acute cardiopulmonary disease. Electronically Signed   By: Yetta Glassman M.D.   On: 05/19/2022 15:37    Procedures Procedures    Medications Ordered in ED Medications  lactated ringers bolus 1,000 mL (0 mLs Intravenous Stopped 05/19/22 1841)    ED Course/ Medical Decision Making/ A&P                           Medical Decision Making  46 year old male with a medical history significant for morbid obesity, hypertension, nonischemic cardiomyopathy, CHF, LVAD dependent who presents to the emergency department with nonspecific chest discomfort.  He denies any radiation.  Denies any shortness of breath.  He felt that he had had some fluid on his lungs and so admitted to taking 2 doses of torsemide in the past few days.  He was evaluated bedside by the LVAD coordinator and heart failure team and it was felt that he was dehydrated.  Denies any other complaints at this time and is well-appearing.  On arrival, the patient was vitally stable.  LVAD interrogated by heart failure team bedside.  Suction events were noted.  Felt that the patient was dehydrated in the setting of his torsemide use.  LVAD team instructed ED to provide a 1 L LR bolus.  Case discussed with Dr. Karena Addison of heart failure team by Ellen Henri PA who evaluated the patient bedside.  Oratory evaluation significant for hemoconcentration with a hemoglobin of 17.1, evidence of an AKI on CKD with a creatinine of 2.53 baseline of 1.3-1.6.  Troponins mildly elevated   To 242 and 263.  Patient denies any active symptoms of chest discomfort at this time. Lower suspicion for ACS or  PE.  LDH only mildly elevated to 210 with lower concern for pulm thrombosis at this time.  MAPs stable.  Per Ellen Henri, PA of HF team: "Discussed again w/ Dr. Aundra Dubin and Dr. [name]. Dehydrated. Give 1L IVF for hydration. Does not need admission, can d/c from ED after fluids. We have f/u arranged in our clinic. I will put appt info in his AVS."  Evaluated the patient bedside, he denies any complaints at this time and feels symptomatically improved following a liter IV fluid bolus.  I recommended that the patient return to the emergency department or call the LVAD clinic in the event of any worsening symptoms. His MAP remained stable. He has scheduled follow-up in 1 week.  Final Clinical Impression(s) / ED Diagnoses Final diagnoses:  LVAD (left ventricular assist device) present [facility])  Dehydration  Chest discomfort  AKI (acute kidney injury) [facility])    Rx / DC Orders ED Discharge Orders     None         Regan Lemming, MD 05/19/22 Sandrea Hughs    Regan Lemming, MD 05/19/22 1902

## 2022-05-19 NOTE — ED Provider Triage Note (Signed)
Emergency Medicine Provider Triage Evaluation Note  [name] , a 46 y.o. male  was evaluated in triage.  Pt complains of chest pain.  Started today describes discomfort.  Does not radiate elsewhere, states has felt this before when there is been fluid in his lungs.  Endorses lower extremity swelling.  Review of Systems  Per HPI  Physical Exam  Pulse (!) 51   Temp 98.8 F (37.1 C) (Oral)   Resp 16   SpO2 99%  Gen:   Awake, no distress   Resp:  Normal effort  MSK:   Moves extremities without difficulty  Other:  Large murmur from LVAD.  Pitting edema to lower extremities bilaterally  Medical Decision Making  Medically screening exam initiated at 2:49 PM.  Appropriate orders placed.  [name] was informed that the remainder of the evaluation will be completed by another provider, this initial triage assessment does not replace that evaluation, and the importance of remaining in the ED until their evaluation is complete.  Charge nurse aware patient is now by patient, will consult LVAD team.  Orders placed.   Bud Face, PA-C 05/19/22 1453

## 2022-05-19 NOTE — Discharge Instructions (Addendum)
You were seen in the emergency department for chest discomfort.  The LVAD coordinator and heart failure team evaluated you bedside and felt that you needed IV rehydration.  Plan for follow-up in that clinic in a few days for recheck

## 2022-05-19 NOTE — ED Triage Notes (Signed)
Pt here with complaints of L sided chest discomfort onset today. Denies any changes to medications recently.

## 2022-05-20 ENCOUNTER — Telehealth: Payer: Self-pay

## 2022-05-20 ENCOUNTER — Other Ambulatory Visit: Payer: Self-pay

## 2022-05-20 ENCOUNTER — Other Ambulatory Visit (HOSPITAL_COMMUNITY): Payer: Self-pay | Admitting: Nurse Practitioner

## 2022-05-20 MED ORDER — TORSEMIDE 20 MG PO TABS
20.0000 mg | ORAL_TABLET | Freq: Every day | ORAL | 2 refills | Status: DC | PRN
  Filled 2022-05-20: qty 30, 30d supply, fill #0

## 2022-05-20 MED ORDER — TRAZODONE HCL 100 MG PO TABS
100.0000 mg | ORAL_TABLET | Freq: Every day | ORAL | 0 refills | Status: DC
  Filled 2022-05-20: qty 30, 30d supply, fill #0

## 2022-05-20 MED ORDER — NOVOLOG MIX 70/30 FLEXPEN (70-30) 100 UNIT/ML ~~LOC~~ SUPN
38.0000 [IU] | PEN_INJECTOR | Freq: Two times a day (BID) | SUBCUTANEOUS | 11 refills | Status: AC
  Filled 2022-05-20: qty 15, 20d supply, fill #0
  Filled 2022-07-06 – 2022-07-16 (×2): qty 15, 20d supply, fill #1
  Filled 2023-04-07: qty 15, 20d supply, fill #2

## 2022-05-20 NOTE — Telephone Encounter (Signed)
Transition Care Management Follow-up Telephone Call Date of discharge and from where: 05/19/2022 from [facility] How have you been since you were released from the hospital? Patient stated that he is feeling better today and did not have any questions or concerns at this time.  Any questions or concerns? No  Items Reviewed: Did the pt receive and understand the discharge instructions provided? Yes  Medications obtained and verified? Yes  Other? No  Any new allergies since your discharge? No  Dietary orders reviewed? No Do you have support at home? Yes   Functional Questionnaire: (I = Independent and D = Dependent) ADLs: I  Bathing/Dressing- I  Meal Prep- I  Eating- I  Maintaining continence- I  Transferring/Ambulation- I  Managing Meds- I   Follow up appointments reviewed:  PCP Hospital f/u appt confirmed? No   Specialist Hospital f/u appt confirmed? Yes  Scheduled to see Heart and Vascular on 05/28/2022 @ 11:00. Are transportation arrangements needed? No  If their condition worsens, is the pt aware to call PCP or go to the Emergency Dept.? Yes Was the patient provided with contact information for the PCP's office or ED? Yes Was to pt encouraged to call back with questions or concerns? Yes

## 2022-05-27 ENCOUNTER — Other Ambulatory Visit (HOSPITAL_COMMUNITY): Payer: Self-pay | Admitting: *Deleted

## 2022-05-27 DIAGNOSIS — Z7901 Long term (current) use of anticoagulants: Secondary | ICD-10-CM

## 2022-05-27 DIAGNOSIS — Z95811 Presence of heart assist device: Secondary | ICD-10-CM

## 2022-05-27 DIAGNOSIS — I5022 Chronic systolic (congestive) heart failure: Secondary | ICD-10-CM

## 2022-05-28 ENCOUNTER — Encounter (HOSPITAL_COMMUNITY): Payer: Medicaid Other

## 2022-06-03 ENCOUNTER — Other Ambulatory Visit (HOSPITAL_COMMUNITY): Payer: Self-pay | Admitting: *Deleted

## 2022-06-03 ENCOUNTER — Ambulatory Visit (INDEPENDENT_AMBULATORY_CARE_PROVIDER_SITE_OTHER): Payer: Medicaid Other

## 2022-06-03 DIAGNOSIS — Z7901 Long term (current) use of anticoagulants: Secondary | ICD-10-CM

## 2022-06-03 DIAGNOSIS — I428 Other cardiomyopathies: Secondary | ICD-10-CM | POA: Diagnosis not present

## 2022-06-03 DIAGNOSIS — Z95811 Presence of heart assist device: Secondary | ICD-10-CM

## 2022-06-03 LAB — CUP PACEART REMOTE DEVICE CHECK
Battery Remaining Percentage: 90 %
Date Time Interrogation Session: 20230830111500
Implantable Lead Implant Date: 20160812
Implantable Lead Location: 753862
Implantable Lead Model: 3401
Implantable Pulse Generator Implant Date: 20210812
Pulse Gen Serial Number: 139514

## 2022-06-04 ENCOUNTER — Encounter (HOSPITAL_COMMUNITY): Payer: Self-pay

## 2022-06-04 ENCOUNTER — Ambulatory Visit (HOSPITAL_COMMUNITY): Payer: Self-pay | Admitting: Pharmacist

## 2022-06-04 ENCOUNTER — Ambulatory Visit (HOSPITAL_COMMUNITY)
Admission: RE | Admit: 2022-06-04 | Discharge: 2022-06-04 | Disposition: A | Discharge status: Home / Self Care | Payer: Medicaid Other | Source: Ambulatory Visit | Attending: Internal Medicine | Admitting: Internal Medicine

## 2022-06-04 ENCOUNTER — Other Ambulatory Visit: Payer: Self-pay

## 2022-06-04 DIAGNOSIS — R059 Cough, unspecified: Secondary | ICD-10-CM | POA: Insufficient documentation

## 2022-06-04 DIAGNOSIS — Z794 Long term (current) use of insulin: Secondary | ICD-10-CM | POA: Diagnosis not present

## 2022-06-04 DIAGNOSIS — N1831 Chronic kidney disease, stage 3a: Secondary | ICD-10-CM

## 2022-06-04 DIAGNOSIS — T827XXA Infection and inflammatory reaction due to other cardiac and vascular devices, implants and grafts, initial encounter: Secondary | ICD-10-CM | POA: Diagnosis not present

## 2022-06-04 DIAGNOSIS — Z95811 Presence of heart assist device: Secondary | ICD-10-CM | POA: Diagnosis not present

## 2022-06-04 DIAGNOSIS — I10 Essential (primary) hypertension: Secondary | ICD-10-CM

## 2022-06-04 DIAGNOSIS — Z79899 Other long term (current) drug therapy: Secondary | ICD-10-CM | POA: Insufficient documentation

## 2022-06-04 DIAGNOSIS — Z7901 Long term (current) use of anticoagulants: Secondary | ICD-10-CM

## 2022-06-04 DIAGNOSIS — I5023 Acute on chronic systolic (congestive) heart failure: Secondary | ICD-10-CM | POA: Diagnosis not present

## 2022-06-04 DIAGNOSIS — I480 Paroxysmal atrial fibrillation: Secondary | ICD-10-CM | POA: Diagnosis not present

## 2022-06-04 DIAGNOSIS — I5022 Chronic systolic (congestive) heart failure: Secondary | ICD-10-CM

## 2022-06-04 DIAGNOSIS — E1122 Type 2 diabetes mellitus with diabetic chronic kidney disease: Secondary | ICD-10-CM | POA: Diagnosis not present

## 2022-06-04 LAB — PROTIME-INR
INR: 2.1 — ABNORMAL HIGH (ref 0.8–1.2)
Prothrombin Time: 23.6 seconds — ABNORMAL HIGH (ref 11.4–15.2)

## 2022-06-04 LAB — BASIC METABOLIC PANEL
Anion gap: 5 (ref 5–15)
BUN: 19 mg/dL (ref 6–20)
CO2: 21 mmol/L — ABNORMAL LOW (ref 22–32)
Calcium: 9.2 mg/dL (ref 8.9–10.3)
Chloride: 114 mmol/L — ABNORMAL HIGH (ref 98–111)
Creatinine, Ser: 1.4 mg/dL — ABNORMAL HIGH (ref 0.61–1.24)
GFR, Estimated: >60 mL/min (ref >60)
Glucose, Bld: 93 mg/dL (ref 70–99)
Potassium: 4.2 mmol/L (ref 3.5–5.1)
Sodium: 140 mmol/L (ref 135–145)

## 2022-06-04 LAB — CBC
HCT: 40.4 % (ref 39.0–52.0)
Hemoglobin: 13.9 g/dL (ref 13.0–17.0)
MCH: 30.8 pg (ref 26.0–34.0)
MCHC: 34.4 g/dL (ref 30.0–36.0)
MCV: 89.4 fL (ref 80.0–100.0)
Platelets: 241 10*3/uL (ref 150–400)
RBC: 4.52 MIL/uL (ref 4.22–5.81)
RDW: 15.3 % (ref 11.5–15.5)
WBC: 6.6 10*3/uL (ref 4.0–10.5)
nRBC: 0 % (ref 0.0–0.2)

## 2022-06-04 LAB — LACTATE DEHYDROGENASE: LDH: 211 U/L — ABNORMAL HIGH (ref 98–192)

## 2022-06-04 MED ORDER — PANTOPRAZOLE SODIUM 40 MG PO TBEC
40.0000 mg | DELAYED_RELEASE_TABLET | Freq: Every day | ORAL | 3 refills | Status: DC
  Filled 2022-06-04 – 2022-07-06 (×2): qty 90, 90d supply, fill #0

## 2022-06-04 NOTE — Patient Instructions (Signed)
A refill has been sent to your pharmacy for protonix PharmD will call with INR results and warfarin dosing.  Return to [facility] in 2 months. It will be your one year Intermacs visit so be prepared to perform 6 minute walk. Also, bring your home VAD equipment Psychologist, sport and exercise and MPU) for annual maintenance.

## 2022-06-04 NOTE — Progress Notes (Signed)
Patient presents for 2 mo follow up in [facility] today alone. Reports no problem with VAD or equipment.  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 Denies any limitations. He tells me that he hasn't been able to do as much coaching due to the heat and humidity.  Pt is c/o cough throughout the day. Pt tells me that he ran out of his protonix a couple weeks ago.  Dr [name] d/w pt that he needs to resume his protonix as this may be causing his cough. Refill sent to pts pharmacy.  Pt is taking ozempic, his weight is down 8 lbs today.  Vital Signs:  Doppler Pressure: 90 Automatc BP:  106/74 (87) HR:  93 SPO2: 95% on RA   Weight: 305.4 lb w/ eqt Last weight:  313 lbs w/equt   VAD Indication: Destination Therapy due to smoking and BMI   VAD interrogation & Equipment Management: Speed: 6200 Flow: 5 Power: 5.2w    PI: 2.6 Hct:  40   Alarms: no clinical alarms Events: 10-20 daily  Fixed speed 6200 Low speed limit: 5900   Primary Controller:  Replace back up battery in 21 months. Back up controller:   Replace back up battery in 24 months.   Annual Equipment Maintenance on UBC/PM was performed on 07/21/21.    I reviewed the LVAD parameters from today and compared the results to the patient's prior recorded data. LVAD interrogation was NEGATIVE for significant power changes, NEGATIVE for clinical alarms and POSITIVE for PI events/speed drops. No programming changes were made and pump is functioning within specified parameters. Pt is performing daily controller and system monitor self tests along with completing weekly and monthly maintenance for LVAD equipment.   LVAD equipment check completed and is in good working order. Back-up equipment present.    Exit Site Care: Drive line is being maintained daily by [employer] his wife. Pt has daily dressing on. Pt states that he has no drainage but uses daily during the summer since he tends to sweat more. Advised  patient if any changes in appearance of exit site, he needs to call VAD clinic. Pt erbalized understanding of same.     Device:  Left subcutaneous dual ICD Therapies: on at 250 BPM Last check: 03/04/22   BP & Labs:  Doppler 90 - Doppler is reflecting Map   Hgb 13.9 - No S/S of bleeding. Specifically denies melena/BRBPR or nosebleeds.   LDH stable at 211 with established baseline of 160 - 300. Denies tea-colored urine. No power elevations noted on interrogation.   Plan:  A refill has been sent to your pharmacy for protonix PharmD will call with INR results and warfarin dosing.  Return to [facility] in 2 months. It will be your one year Intermacs visit so be prepared to perform 6 minute walk. Also, bring your home VAD equipment Psychologist, sport and exercise and MPU) for annual maintenance.    Tanda Rockers RN [place] Coordinator  Office: [phone]  24/7 Pager: [phone]

## 2022-06-06 NOTE — Progress Notes (Signed)
VAD CLINIC Follow-up Visit  PCP: Teena Dunk, NP HF MD: DB   HPI:  Jeffrey [name] is a 46 y.o. male with a history of  poorly controlled HTN, R renal cell carcinoma s/p nephrectomy, CKD IIIa, DM2, OSA, gout, morbid obesity and systolic HF due to NICM. S/p HM-3 VAD placement 07/21/21  Has [place]-[employer] S-ICD   HF dates back to 2012 with EF 25%   Admitted 7/22 with CHF/cardiogenic shock in setting AF with RVR. He was placed on amio gtt and underwent cardioversion. Echo EF < 20%, severe LV dilation, moderate RV dysfunction. Creatinine peaked at 4.65 improved to 1.58 which is his baselin  Underwent HM3 LVAD by Dr. Cyndia Bent on 07/21/21. Had post-op AF requiring DC-CV.  Follow up for Heart Failure/LVAD:  Here for routine VAD f/u. Doing well. Enjoying coaching. Says he ran out of protonix and is having more of a cough. Denies orthopnea or PND. No fevers, chills or problems with driveline. No bleeding, melena or neuro symptoms. No VAD alarms. Taking all meds as prescribed. Weight down 8 pounds on Ozempic.      VAD Indication: Destination Therapy due to smoking and BMI   VAD interrogation & Equipment Management: Speed: 6200 Flow: 5 Power: 5.2w    PI: 2.6 Hct:  40   Alarms: no clinical alarms Events: 10-20 daily  Fixed speed 6200 Low speed limit: 5900   Primary Controller:  Replace back up battery in 21 months. Back up controller:   Replace back up battery in 24 months.   Annual Equipment Maintenance on U[employer]/PM was performed on 07/21/21.    I reviewed the LVAD parameters from today and compared the results to the patient's prior recorded data. LVAD interrogation was NEGATIVE for significant power changes, NEGATIVE for clinical alarms and POSITIVE for PI events/speed drops. No programming changes were made and pump is functioning within specified parameters. Pt is performing daily controller and system monitor self tests along with completing weekly and monthly maintenance for  LVAD equipment.   LVAD equipment check completed and is in good working order. Back-up equipment present.   Past Medical History:  Diagnosis Date   Anxiety    Aspirin allergy    Childhood asthma    Chronic systolic CHF (congestive heart failure) ([place])    a. EF 20-25% in 2012. b. EF 45-50% in 10/2011 with nonischemic nuc - presumed NICM. c. 12/2014 Echo: Sev depressed LV fxn, sev dil LV, mild LVH, mild MR, sev dil LA, mildly reduced RV fxn.   CKD (chronic kidney disease) stage 2, GFR 60-89 ml/min    H/O vasectomy 12/2019   High cholesterol    Hypertension    Morbid obesity ([place])    Nephrolithiasis    OSA on CPAP    Paroxysmal atrial fibrillation ([place])    Presumed NICM    a. 04/2014 Myoview: EF 26%, glob HK, sev glob HK, ? prior infarct;  b. Never cathed 2/2 CKD.   Renal cell carcinoma ([place])    a. s/p Rt robotic assisted partial converted to radical nephrectomy on 01/2013.   Troponin level elevated    a. 04/2014, 12/2014: felt due to CHF.   Type II diabetes mellitus ([facility])    Ventricular tachycardia ([place])    a. appropriate ICD therapy 12/2017    Current Outpatient Medications  Medication Sig Dispense Refill   Accu-Chek FastClix Lancets MISC USE AS DIRECTED FOUR TIMES DAILY 102 each 11   acetaminophen (TYLENOL) 325 MG tablet  Take 2 tablets (650 mg total) by mouth every 6 (six) hours as needed for fever.     allopurinol (ZYLOPRIM) 100 MG tablet Take 1 tablet (100 mg total) by mouth daily. 90 tablet 3   amiodarone (PACERONE) 200 MG tablet Take 1 tablet (200 mg total) by mouth daily. 30 tablet 6   amLODipine (NORVASC) 10 MG tablet Take 1 tablet (10 mg total) by mouth daily 30 tablet 6   colchicine 0.6 MG tablet Take 1 tablet (0.6 mg total) by mouth once daily. 30 tablet 6   diclofenac Sodium (VOLTAREN) 1 % GEL Apply 2 g topically 4 (four) times daily. (Patient taking differently: Apply 2 g topically 4 (four) times daily as needed (pain).) 100 g 1   docusate sodium (COLACE) 100 MG capsule  Take 1 capsule (100 mg total) by mouth 2 (two) times daily. 10 capsule 0   doxazosin (CARDURA) 1 MG tablet Take 1 tablet (1 mg total) by mouth daily. 30 tablet 6   gabapentin (NEURONTIN) 300 MG capsule Take 1 capsule (300 mg total) by mouth 3 (three) times daily. (Patient taking differently: Take 300 mg by mouth 2 (two) times daily.) 90 capsule 6   glucose blood test strip Use as directed 4 times daily. 100 each 11   insulin aspart protamine - aspart (NOVOLOG MIX 70/30 FLEXPEN) (70-30) 100 UNIT/ML FlexPen Inject 38 Units into the skin 2 (two) times daily. 15 mL 11   Insulin Pen Needle (TRUEPLUS 5-BEVEL PEN NEEDLES) 29G X 12.7MM MISC Use as directed in the morning, at noon, and at bedtime. 100 each 3   Insulin Syringe-Needle U-100 (TRUEPLUS INSULIN SYRINGE) 31G X 5/16" 0.5 ML MISC USE TO INJECT INSULIN TWICE DAILY. 100 each 11   isosorbide-hydrALAZINE (BIDIL) 20-37.5 MG tablet Take 2 tablets by mouth 3 (three) times daily. (Patient taking differently: Take 2 tablets by mouth in the morning and at bedtime.) 180 tablet 6   meclizine (ANTIVERT) 25 MG tablet Take 1 tablet (25 mg total) by mouth 3 (three) times daily as needed for dizziness. 90 tablet 2   metFORMIN (GLUCOPHAGE) 1000 MG tablet Take 1 tablet (1,000 mg total) by mouth 2 (two) times daily with a meal. 60 tablet 11   sacubitril-valsartan (ENTRESTO) 97-103 MG Take 1 tablet by mouth 2 (two) times daily. 60 tablet 6   Semaglutide, 2 MG/DOSE, (OZEMPIC, 2 MG/DOSE,) 8 MG/3ML SOPN Inject 2 mg into the skin once a week. 3 mL 11   sertraline (ZOLOFT) 100 MG tablet Take 1.5 tablets (150 mg total) by mouth daily. 45 tablet 6   spironolactone (ALDACTONE) 25 MG tablet Take 1 tablet (25 mg total) by mouth daily. 30 tablet 6   torsemide (DEMADEX) 20 MG tablet Take 1 tablet (20 mg total) by mouth daily as needed (If wt gain > 3 lb in 24 hr or > 5 lb in 1 week). 30 tablet 2   traMADol (ULTRAM) 50 MG tablet Take 1-2 tablets (50-100 mg total) by mouth 2 (two)  times daily. 60 tablet 5   traZODone (DESYREL) 100 MG tablet Take 1 tablet (100 mg total) by mouth at bedtime. 30 tablet 0   warfarin (COUMADIN) 4 MG tablet Take 8 mg (2 tablets) every Monday/Wednesday/Friday and 4 mg (1 tablet) all other days or as directed by HF clinic (Patient taking differently: Take 4-8 mg by mouth as directed. Take 1 tablet (4 mg) every (Tuesday & Saturday) & Take 2 tablets (8 mg) all other days or as directed by  HF clinic) 180 tablet 3   enoxaparin (LOVENOX) 60 MG/0.6ML injection Inject 0.6 mLs (60 mg total) into the skin every 12 (twelve) hours. (Patient not taking: Reported on 06/04/2022) 6 mL 0   pantoprazole (PROTONIX) 40 MG tablet Take 1 tablet (40 mg total) by mouth once daily. 90 tablet 3   No current facility-administered medications for this encounter.    Aspirin, Bee venom, Lisinopril, Shrimp (diagnostic), and Tomato  REVIEW OF SYSTEMS: All systems negative except as listed in HPI, PMH and Problem list.   Vitals:   06/04/22 0930 06/04/22 0931  BP: 106/74 (!) 90/0  Pulse: 93   SpO2: 95%   Weight: (!) 138.5 kg (305 lb 6.4 oz)      Vital Signs:  Doppler Pressure: 90 Automatc BP:  106/74 (87) HR:  93 SPO2: 95% on RA   Weight: 305.4 lb w/ eqt Last weight:  313 lbs w/equt    General:  NAD.  HEENT: normal  Neck: supple. JVP not elevated.  Carotids 2+ bilat; no bruits. No lymphadenopathy or thryomegaly appreciated. Cor: LVAD hum.  Lungs: Clear. Abdomen: obese soft, nontender, non-distended. No hepatosplenomegaly. No bruits or masses. Good bowel sounds. Driveline site clean. Anchor in place.  Extremities: no cyanosis, clubbing, rash. Warm no edema  Neuro: alert & oriented x 3. No focal deficits. Moves all 4 without problem     ASSESSMENT AND PLAN:  .1. Chronic systolic CHF - Nonischemic CM, end-stage - EF 2012; 20-25%  - Echo  8/22: EF < 20%, severe LV dilation, moderate RV dysfunction.   - RHC (8/22): well compensated hemodynamics. - s/p  S-ICD - S/p HM3 LVAD 10/17 (DT) - Doing great NYHA I. Volume looks good.  - MAPs look good.  - Continue Bidil 2 tabs tid - Continue spironolactone 25 mg daily  - Continue entresto to 97/130 bid - Stressed need to continue weight loss efforts (see below). Once BMI ~ 35 will refer to [facility] for begin transplant evaluation (Body mass index is 47.83 kg/m.)   2. VAD  - s/p HM-3 placement 07/21/21  - VAD interrogated personally. Parameters stable. - INR 2.1 Goal INR 2.0-3.0 Discussed dosing with PharmD personally. - LDH 211 - DL site looks good - Hgb 13.9  3. DL site infection - resolved  4 Atrial fibrillation: Paroxysmal - Had severe AF with RVR 7/22 s/p DC-CV x 2 - Seen by EP. Not a candidate for ablation due to body habitus  (could consider AVN ablation and CRT, if needed)  - Had post-op AF with RVR post VAD - 10/23 S/P DC-CV with restoration of NSR. - Remains in NSR Continue amio 100 daily - On warfarin. INR 2.1   5. HTN - MAPs look good. Continue current regimen  6. CKD stage 3B  - Has solitary kidney.   - Baseline creatinine is 1.5-1.8.  - Creatinine 1.40 today    7. OSA - Continue CPAP   8. DM2  - On Jardiance. - Hgb A1c 9.4. -> 5.9 - PCP following  9. Polyarticular gout. - quiescent  10. Depression/anxiety - Counseled him that this is not infrequent post-op symptom. Stressed need to remain active and interact with others. - Continue Zoloft '150mg'$  daily - Much improved with coaching. Great to see  11. Morbid obesity - Body mass index is 47.83 kg/m. - Weight coming down - Continue GLP1RA  Total time spent 35 minutes. Over half that time spent discussing above.    Glori Bickers, MD  6:52 PM

## 2022-06-10 ENCOUNTER — Other Ambulatory Visit: Payer: Self-pay

## 2022-06-15 ENCOUNTER — Other Ambulatory Visit: Payer: Self-pay

## 2022-06-15 ENCOUNTER — Other Ambulatory Visit (HOSPITAL_COMMUNITY): Payer: Self-pay

## 2022-06-15 DIAGNOSIS — E119 Type 2 diabetes mellitus without complications: Secondary | ICD-10-CM

## 2022-06-15 DIAGNOSIS — E1159 Type 2 diabetes mellitus with other circulatory complications: Secondary | ICD-10-CM

## 2022-06-15 DIAGNOSIS — I5022 Chronic systolic (congestive) heart failure: Secondary | ICD-10-CM

## 2022-06-15 DIAGNOSIS — M1A9XX0 Chronic gout, unspecified, without tophus (tophi): Secondary | ICD-10-CM

## 2022-06-15 DIAGNOSIS — Z95811 Presence of heart assist device: Secondary | ICD-10-CM

## 2022-06-15 DIAGNOSIS — M109 Gout, unspecified: Secondary | ICD-10-CM

## 2022-06-15 DIAGNOSIS — R739 Hyperglycemia, unspecified: Secondary | ICD-10-CM

## 2022-06-15 MED ORDER — METFORMIN HCL 1000 MG PO TABS
1000.0000 mg | ORAL_TABLET | Freq: Two times a day (BID) | ORAL | 11 refills | Status: DC
  Filled 2022-06-15: qty 60, 30d supply, fill #0
  Filled 2022-07-14: qty 60, 30d supply, fill #1

## 2022-06-15 MED ORDER — SPIRONOLACTONE 25 MG PO TABS
25.0000 mg | ORAL_TABLET | Freq: Every day | ORAL | 6 refills | Status: DC
  Filled 2022-06-15: qty 30, 30d supply, fill #0
  Filled 2022-07-14: qty 30, 30d supply, fill #1

## 2022-06-15 MED ORDER — ALLOPURINOL 100 MG PO TABS
100.0000 mg | ORAL_TABLET | Freq: Every day | ORAL | 3 refills | Status: DC
  Filled 2022-06-15: qty 90, 90d supply, fill #0

## 2022-06-15 MED ORDER — COLCHICINE 0.6 MG PO TABS
0.6000 mg | ORAL_TABLET | Freq: Every day | ORAL | 6 refills | Status: DC
  Filled 2022-06-15: qty 30, 30d supply, fill #0
  Filled 2022-07-14: qty 30, 30d supply, fill #1

## 2022-06-15 MED ORDER — AMLODIPINE BESYLATE 10 MG PO TABS
10.0000 mg | ORAL_TABLET | Freq: Every day | ORAL | 6 refills | Status: DC
Start: 2022-06-15 — End: 2022-08-05
  Filled 2022-06-15: qty 30, 30d supply, fill #0

## 2022-06-15 MED ORDER — GABAPENTIN 300 MG PO CAPS
300.0000 mg | ORAL_CAPSULE | Freq: Three times a day (TID) | ORAL | 6 refills | Status: DC
  Filled 2022-06-15: qty 90, 30d supply, fill #0

## 2022-06-18 ENCOUNTER — Other Ambulatory Visit (HOSPITAL_COMMUNITY): Payer: Self-pay | Admitting: Unknown Physician Specialty

## 2022-06-18 DIAGNOSIS — Z7901 Long term (current) use of anticoagulants: Secondary | ICD-10-CM

## 2022-06-18 DIAGNOSIS — Z95811 Presence of heart assist device: Secondary | ICD-10-CM

## 2022-06-22 ENCOUNTER — Other Ambulatory Visit (HOSPITAL_COMMUNITY): Payer: Medicaid Other

## 2022-06-23 ENCOUNTER — Other Ambulatory Visit: Payer: Self-pay

## 2022-06-25 ENCOUNTER — Ambulatory Visit (HOSPITAL_COMMUNITY): Payer: Self-pay | Admitting: Pharmacist

## 2022-06-25 ENCOUNTER — Ambulatory Visit (HOSPITAL_COMMUNITY)
Admission: RE | Admit: 2022-06-25 | Discharge: 2022-06-25 | Disposition: A | Discharge status: Home / Self Care | Payer: Medicaid Other | Source: Ambulatory Visit | Attending: Internal Medicine | Admitting: Internal Medicine

## 2022-06-25 DIAGNOSIS — Z7901 Long term (current) use of anticoagulants: Secondary | ICD-10-CM | POA: Insufficient documentation

## 2022-06-25 DIAGNOSIS — Z95811 Presence of heart assist device: Secondary | ICD-10-CM | POA: Insufficient documentation

## 2022-06-25 LAB — PROTIME-INR
INR: 2.6 — ABNORMAL HIGH (ref 0.8–1.2)
Prothrombin Time: 27.3 seconds — ABNORMAL HIGH (ref 11.4–15.2)

## 2022-06-25 NOTE — Progress Notes (Signed)
Remote ICD transmission.   

## 2022-06-26 ENCOUNTER — Other Ambulatory Visit: Payer: Self-pay | Admitting: Obstetrics and Gynecology

## 2022-06-26 NOTE — Patient Instructions (Signed)
Hey Jeffrey [name], sorry I have missed you today, I hope you are doing okay - as a part of your Medicaid benefit, you are eligible for care management and care coordination services at no cost or copay. I was unable to reach you by phone today but would be happy to help you with your health related needs. Please feel free to call me at [phone].  A member of the Managed Medicaid care management team will reach out to you again over the next 30 business days.   [name] RN, BSN [place]  [facility] - Managed Medicaid High Risk [phone]

## 2022-06-26 NOTE — Patient Outreach (Signed)
Care Coordination  06/26/2022  Jeffrey [name] 1975-12-16 [id number]   Medicaid Managed Care   Unsuccessful Outreach Note  06/26/2022 Name: Jeffrey [name] MRN: [id number] DOB: 1976/05/31  Referred by: Teena Dunk, NP Reason for referral : High Risk Managed Medicaid (Unsuccessful telephone outreach)   An unsuccessful telephone outreach was attempted today. The patient was referred to the case management team for assistance with care management and care coordination.   Follow Up Plan: The care management team will reach out to the patient again over the next 30 business  days.   [name] RN, BSN [place]  [facility] - Managed Medicaid High Risk [phone].

## 2022-07-02 ENCOUNTER — Other Ambulatory Visit: Payer: Self-pay

## 2022-07-03 ENCOUNTER — Other Ambulatory Visit: Payer: Self-pay

## 2022-07-03 ENCOUNTER — Other Ambulatory Visit: Payer: Self-pay | Admitting: Nurse Practitioner

## 2022-07-06 ENCOUNTER — Other Ambulatory Visit: Payer: Self-pay

## 2022-07-09 ENCOUNTER — Other Ambulatory Visit (HOSPITAL_COMMUNITY): Payer: Self-pay | Admitting: *Deleted

## 2022-07-09 DIAGNOSIS — Z95811 Presence of heart assist device: Secondary | ICD-10-CM

## 2022-07-09 DIAGNOSIS — Z7901 Long term (current) use of anticoagulants: Secondary | ICD-10-CM

## 2022-07-13 ENCOUNTER — Other Ambulatory Visit: Payer: Self-pay

## 2022-07-13 ENCOUNTER — Ambulatory Visit (HOSPITAL_COMMUNITY): Payer: Self-pay | Admitting: Pharmacist

## 2022-07-13 ENCOUNTER — Ambulatory Visit (HOSPITAL_COMMUNITY)
Admission: RE | Admit: 2022-07-13 | Discharge: 2022-07-13 | Disposition: A | Discharge status: Home / Self Care | Payer: Medicaid Other | Source: Ambulatory Visit | Attending: Cardiology | Admitting: Cardiology

## 2022-07-13 DIAGNOSIS — Z95811 Presence of heart assist device: Secondary | ICD-10-CM

## 2022-07-13 DIAGNOSIS — Z7901 Long term (current) use of anticoagulants: Secondary | ICD-10-CM | POA: Diagnosis not present

## 2022-07-13 LAB — PROTIME-INR
INR: 1.7 — ABNORMAL HIGH (ref 0.8–1.2)
Prothrombin Time: 20.1 seconds — ABNORMAL HIGH (ref 11.4–15.2)

## 2022-07-15 ENCOUNTER — Other Ambulatory Visit: Payer: Self-pay

## 2022-07-16 ENCOUNTER — Other Ambulatory Visit: Payer: Self-pay

## 2022-07-30 ENCOUNTER — Other Ambulatory Visit: Payer: Self-pay | Admitting: Obstetrics and Gynecology

## 2022-07-30 NOTE — Patient Outreach (Signed)
Medicaid Managed Care   Nurse Care Manager Note  07/30/2022 Name:  Jeffrey [name] MRN:  [id number] DOB:  [birth year]  Jeffrey [name] is an 46 y.o. year old male who is a primary patient of [place], Jake Church I, NP.  The [facility] Managed Care Coordination team was consulted for assistance with:    Chronic healthcare management needs, DM, HTN, OSA, CHF, Afib, CKD, gout, DDD, s/p LVAD, h/o renal cell carcinoma  Jeffrey [name] was given information about Medicaid Managed Care Coordination team services today. Jeffrey [name] Patient agreed to services and verbal consent obtained.  Engaged with patient by telephone for follow up visit in response to provider referral for case management and/or care coordination services.   Assessments/Interventions:  Review of past medical history, allergies, medications, health status, including review of consultants reports, laboratory and other test data, was performed as part of comprehensive evaluation and provision of chronic care management services.  SDOH (Social Determinants of Health) assessments and interventions performed: SDOH Interventions    Flowsheet Row Patient Outreach Telephone from 07/30/2022 in [place] Patient Outreach Telephone from 05/18/2022 in [place] Patient Outreach Telephone from 02/13/2022 in [place] Patient Outreach Telephone from 01/12/2022 in [place] Patient Outreach Telephone from 12/12/2021 in [place] Patient Outreach Telephone from 10/21/2021 in [place] Interventions        Food Insecurity Interventions -- -- -- Other (Comment)  [applied for food stamps-has not heard-is going to DSS office to follow up] -- --  Housing Interventions -- -- -- Intervention Not Indicated -- --   Transportation Interventions -- -- Other (Comment)  [Insurance Plan transportation available to patient] -- -- --  Utilities Interventions Intervention Not Indicated -- -- -- -- --  Alcohol Usage Interventions Intervention Not Indicated (Score <7) -- -- -- -- --  Financial Strain Interventions -- -- -- -- Other (Comment)  [referral to MM BSW] --  Physical Activity Interventions Intervention Not Indicated -- -- -- -- --  Stress Interventions -- Intervention Not Indicated -- -- -- Intervention Not Indicated  Social Connections Interventions -- -- -- -- -- Intervention Not Indicated      Care Plan  Allergies  Allergen Reactions   Aspirin Shortness Of Breath, Itching and Rash     Burning sensation (Patient reports he tolerates other NSAIDS)    Bee Venom Hives and Swelling   Lisinopril Cough   Shrimp (Diagnostic) Rash   Tomato Rash    Medications Reviewed Today     Reviewed by Jeffrey Medicus, RN (Registered Nurse) on 07/30/22 at [place] List Status: <None>   Medication Order Taking? Sig Documenting Provider Last Dose Status Informant  Accu-Chek FastClix Lancets MISC [id number] No USE AS DIRECTED FOUR TIMES DAILY Vevelyn Francois, NP Taking Active Spouse/Significant Other  acetaminophen (TYLENOL) 325 MG tablet [id number] No Take 2 tablets (650 mg total) by mouth every 6 (six) hours as needed for fever. Darrick Grinder D, NP Taking Active Spouse/Significant Other  allopurinol (ZYLOPRIM) 100 MG tablet [id number]  Take 1 tablet (100 mg total) by mouth daily. Bensimhon, Shaune Pascal, MD  Active   amiodarone (PACERONE) 200 MG tablet [id number] No Take 1 tablet (200 mg total) by mouth daily. Darrick Grinder D, NP Taking Active Spouse/Significant Other  amLODipine (NORVASC) 10 MG tablet [id number]  Take 1 tablet (10 mg total) by  mouth daily Bensimhon, Shaune Pascal, MD  Active   colchicine 0.6 MG tablet [id number]  Take 1 tablet (0.6 mg total) by mouth once daily. Bensimhon, Shaune Pascal, MD  Active   diclofenac Sodium  (VOLTAREN) 1 % GEL [id number] No Apply 2 g topically 4 (four) times daily.  Patient taking differently: Apply 2 g topically 4 (four) times daily as needed (pain).   Izora Ribas, MD Taking Active Spouse/Significant Other  docusate sodium (COLACE) 100 MG capsule [id number] No Take 1 capsule (100 mg total) by mouth 2 (two) times daily. Darrick Grinder D, NP Taking Active Spouse/Significant Other  doxazosin (CARDURA) 1 MG tablet [id number] No Take 1 tablet (1 mg total) by mouth daily. Bensimhon, Shaune Pascal, MD Taking Active Spouse/Significant Other  enoxaparin (LOVENOX) 60 MG/0.6ML injection [id number] No Inject 0.6 mLs (60 mg total) into the skin every 12 (twelve) hours.  Patient not taking: Reported on 06/04/2022   Bensimhon, Shaune Pascal, MD Not Taking Active Spouse/Significant Other  gabapentin (NEURONTIN) 300 MG capsule [id number]  Take 1 capsule (300 mg total) by mouth 3 (three) times daily. Bensimhon, Shaune Pascal, MD  Active   glucose blood test strip [id number] No Use as directed 4 times daily. Bo Merino I, NP Taking Active Spouse/Significant Other  insulin aspart protamine - aspart (NOVOLOG MIX 70/30 FLEXPEN) (70-30) 100 UNIT/ML FlexPen [id number] No Inject 38 Units into the skin 2 (two) times daily. Fenton Foy, NP Taking Active   Insulin Pen Needle (TRUEPLUS 5-BEVEL PEN NEEDLES) 29G X 12.7MM MISC [id number] No Use as directed in the morning, at noon, and at bedtime. Bo Merino I, NP Taking Active Spouse/Significant Other  Insulin Syringe-Needle U-100 (TRUEPLUS INSULIN SYRINGE) 31G X 5/16" 0.5 ML MISC [id number] No USE TO INJECT INSULIN TWICE DAILY. Bo Merino I, NP Taking Active Spouse/Significant Other  isosorbide-hydrALAZINE (BIDIL) 20-37.5 MG tablet [id number] No Take 2 tablets by mouth 3 (three) times daily.  Patient taking differently: Take 2 tablets by mouth in the morning and at bedtime.   Bensimhon, Shaune Pascal, MD Taking Active Spouse/Significant Other  meclizine (ANTIVERT) 25 MG  tablet [id number] No Take 1 tablet (25 mg total) by mouth 3 (three) times daily as needed for dizziness. Bensimhon, Shaune Pascal, MD Taking Active Spouse/Significant Other  metFORMIN (GLUCOPHAGE) 1000 MG tablet [id number]  Take 1 tablet (1,000 mg total) by mouth 2 (two) times daily with a meal. Bensimhon, Shaune Pascal, MD  Active   pantoprazole (PROTONIX) 40 MG tablet [id number]  Take 1 tablet (40 mg total) by mouth once daily. Bensimhon, Shaune Pascal, MD  Active   sacubitril-valsartan (ENTRESTO) 97-103 MG [id number] No Take 1 tablet by mouth 2 (two) times daily. Bensimhon, Shaune Pascal, MD Taking Active Spouse/Significant Other  Semaglutide, 2 MG/DOSE, (OZEMPIC, 2 MG/DOSE,) 8 MG/3ML SOPN [id number] No Inject 2 mg into the skin once a week. Bensimhon, Shaune Pascal, MD Taking Active Spouse/Significant Other  sertraline (ZOLOFT) 100 MG tablet [id number] No Take 1.5 tablets (150 mg total) by mouth daily. Bensimhon, Shaune Pascal, MD Taking Active Spouse/Significant Other  spironolactone (ALDACTONE) 25 MG tablet [id number]  Take 1 tablet (25 mg total) by mouth daily. Bensimhon, Shaune Pascal, MD  Active   torsemide (DEMADEX) 20 MG tablet [id number] No Take 1 tablet (20 mg total) by mouth daily as needed (If wt gain > 3 lb in 24 hr or > 5 lb in 1 week). Fenton Foy, NP Taking Active   traMADol (ULTRAM) 50 MG tablet [id number] No Take 1-2 tablets (50-100 mg total)  by mouth 2 (two) times daily. Bensimhon, Shaune Pascal, MD Taking Active Spouse/Significant Other  traZODone (DESYREL) 100 MG tablet [id number] No Take 1 tablet (100 mg total) by mouth at bedtime. Fenton Foy, NP Taking Active   warfarin (COUMADIN) 4 MG tablet [id number] No Take 8 mg (2 tablets) every Monday/Wednesday/Friday and 4 mg (1 tablet) all other days or as directed by HF clinic  Patient taking differently: Take 4-8 mg by mouth as directed. Take 1 tablet (4 mg) every (Tuesday & Saturday) & Take 2 tablets (8 mg) all other days or as directed by HF clinic   Bensimhon, Shaune Pascal,  MD Taking Active Spouse/Significant Other           Med Note Jimmey Ralph, [facility] [name]   Tue May 19, 2022  4:57 PM)             Patient Active Problem List   Diagnosis Date Noted   Presence of left ventricular assist device (LVAD) ([place]) 07/25/2021   Acute on chronic systolic CHF (congestive heart failure) ([place]) 07/18/2021   Cardiogenic shock (HCC)    Atrial fibrillation with RVR ([place]) 04/30/2021   Acute hypoxemic respiratory failure (HCC)    Right wrist pain 04/16/2021   Absolute anemia    Rectal bleeding    Iron deficiency 12/25/2020   Long term current use of anticoagulant - apixaban 12/25/2020   Paroxysmal atrial fibrillation (HCC)    Lactic acidosis 11/22/2020   Hypotension 11/22/2020   Aspiration into airway    ICD (implantable cardioverter-defibrillator) battery depletion 05/15/2020   Endotracheal tube present    Respiratory failure ([place])    Acute encephalopathy    Acute pulmonary edema (HCC)    NICM (nonischemic cardiomyopathy) ([place]) 04/24/2020   S/P vasectomy 12/20/2019   Anxiety 12/20/2019   Hemoglobin A1C between 7% and 9% indicating borderline diabetic control ([place]) 12/20/2019   Seasonal allergies 12/20/2019   Insomnia 12/20/2019   Vasectomy evaluation 06/20/2019   Visual problems 06/20/2019   Blurry vision, bilateral 06/20/2019   Hyperglycemia 02/13/2019   Hyperosmolar (nonketotic) coma ([place]) 01/19/2019   AICD (automatic cardioverter/defibrillator) present 01/19/2019   Hyperlipidemia 12/07/2018   Follow-up exam 11/22/2018   Class 3 severe obesity due to excess calories with serious comorbidity and body mass index (BMI) of 45.0 to 49.9 in adult ([place]) 11/22/2018   Dizziness 11/22/2018   Lingular pneumonia 08/04/2018   Ventricular tachycardia ([place]) 12/30/2017   PAF (paroxysmal atrial fibrillation) ([place])    Dilated cardiomyopathy ([place])    Pollen allergy 02/17/2017   Dyslipidemia 02/17/2017   Hematochezia 11/08/2015   CKD (chronic kidney disease), stage III  ([place]) 06/20/2015   Cardiac defibrillator in situ    Chronic systolic CHF (congestive heart failure) ([place]) 03/11/2015   Gouty arthritis 03/11/2015   Chest pain 12/28/2014   Acute renal failure superimposed on stage 3 chronic kidney disease ([place]) 04/08/2014   Aspirin allergy 04/08/2014   Renal cell carcinoma ([place])    Gout attack 11/10/2011   Morbid obesity ([place]) 10/23/2011   OSA (obstructive sleep apnea) 10/23/2011   Type 2 diabetes mellitus with stage 3 chronic kidney disease, with long-term current use of insulin ([place]) 10/22/2011   Hyperlipidemia associated with type 2 diabetes mellitus ([place]) 12/25/2010   Hypertension associated with diabetes ([place]) 12/25/2010   Conditions to be addressed/monitored per PCP order:  Chronic healthcare management needs, DM, HTN, OSA, CHF, Afib, CKD, gout, DDD, s/p LVAD, h/o renal cell carcinoma  Care Plan : General Plan of Care (Adult)  Updates  made by Jeffrey Medicus, RN since 07/30/2022 12:00 AM     Problem: Health Promotion or Disease Self-Management (General Plan of Care)   Priority: High  Onset Date: 08/18/2022  Note:   Current Barriers:  Chronic Disease Management support and education needs, S/P LVAD 07/21/21 07/30/22:  Patient with no complaints today, f/u appts scheduled.  No disability determination yet-patient will continue to f/u.  Nurse Case Manager Clinical Goal(s):  Over the next 90 days, patient will attend all scheduled medical appointments: Over the next 30 days, patient will have dental appt scheduled  Interventions:  Inter-disciplinary care team collaboration (see longitudinal plan of care) Collaborated with BSW regarding food stamps and dental resources. BSW referral for food stamp updates and dental resources-completed Evaluation of current treatment plan and patient's adherence to plan as established by provider.  BSW contacted patient regarding daycare voucher and foodstamps. BSW informed patient he would have to contact DSS  and have the child's name placed on the waitlist, patient can also apply for foodstamps at the same time. Patient reports no other resources needed at this time.  09/04/21: BSW spoke with patient, he stated he is not sure if they have been placed on the daycare voucher because his wife has not said anything to him yet. Patient stated they have applied for foodstamps but have not received them, he thinks its because his wife makes to much money. No other resources are needed at this time. 10/22/21: Bsw completed a follow up telephone call with patient regarding dental resources and follow up with his foodstamps. Patient stated he would like for dental resources to be mailed to him. Patient asked BSW if she could provide an update on his foodstamp application, BSW informed patient he would have to contact DSS call center or his worker to get an update on his foodstamp application. No other resources are needed at this time. 12/15/21: BSW completed telephone outreach with patient. He states he did not receieve the resources for dental. BSW will resend letter. BSW informed patient it may benefit him to going up to DSS to inquire about his foodstamp application. Sometimes it is easier to go up there. Patient stated he was going to try the group session for now and is not interested in therapy, BSW did provide him with the information for [facility].No other resources are needed at this time. 01/02/22: BSW completed telephone outreach with patient. He states he did receive the letter with the resources and is waiting on call backs. Patient states he has not made it down to DSS yet to check on his foodstamp application. No other resources are needed at this time.  03/12/22: BSW competed telephone outreach with patient's wife. She stated they were able to get a daycare voucher and they did apply for foodstamps and she has to turn in her check stubs. She stated no other resources are needed at this time. Reviewed medications  with patient. Discussed plans with patient for ongoing care management follow up and provided patient with direct contact information for care management team Provided patient with educational materials related to exercise. Reviewed scheduled/upcoming provider appointments. Collaborated with Pharmacy Pharmacy referral for medication review  Patient Goals/Self-Care Activities Over the next 90 days, patient will:  -Attends all scheduled provider appointments Calls provider office for new concerns or questions  Follow Up Plan: The Managed Medicaid care management team will reach out to the patient again over the next 30 business  days.  The patient has been provided with  contact information for the Managed Medicaid care management team and has been advised to call with any health related questions or concerns.    Follow Up:  Patient agrees to Care Plan and Follow-up.  Plan: The Managed Medicaid care management team will reach out to the patient again over the next 30 business  days. and The  Patient has been provided with contact information for the Managed Medicaid care management team and has been advised to call with any health related questions or concerns.  Date/time of next scheduled RN care management/care coordination outreach: 09/01/22 at 1030.

## 2022-07-30 NOTE — Patient Instructions (Signed)
Hi Mr. Laban, great to catch up with you today-have a wonderful day!!  Mr. Caltagirone was given information about Medicaid Managed Care team care coordination services as a part of their Healthy [facility] Medicaid benefit. [name] verbally consented to engagement with the [facility] Managed Care team.   If you are experiencing a medical emergency, please call 911 or report to your local emergency department or urgent care.   If you have a non-emergency medical problem during routine business hours, please contact your provider's office and ask to speak with a nurse.   For questions related to your Healthy [facility] health plan, please call: [phone] or visit the homepage here: [email or web address]  If you would like to schedule transportation through your Healthy [facility] plan, please call the following number at least 2 days in advance of your appointment: [phone]  For information about your ride after you set it up, call Ride Assist at [phone]. Use this number to activate a Will Call pickup, or if your transportation is late for a scheduled pickup. Use this number, too, if you need to make a change or cancel a previously scheduled reservation.  If you need transportation services right away, call [phone]. The after-hours call center is staffed 24 hours to handle ride assistance and urgent reservation requests (including discharges) 365 days a year. Urgent trips include sick visits, hospital discharge requests and life-sustaining treatment.  Call the [place] at [phone], at any time, 24 hours a day, 7 days a week. If you are in danger or need immediate medical attention call 911.  If you would like help to quit smoking, call 1-800-QUIT-NOW 250-145-3283) OR Espaol: 1-855-Djelo-Ya (1-856-314-9702) o para ms informacin haga clic aqu or Text READY to 200-400 to register via text  Mr. Milne - following are the goals  we discussed in your visit today:   Goals Addressed             This Visit's Progress    Protect My Health       Timeframe:  Long-Range Goal Priority:  High Start Date:    09/24/20                         Expected End Date:  ongoing             - schedule recommended health tests  - schedule and keep appointment for annual check-up  07/30/22:  patient up to date on all appts-appt with LVAD clinic 08/05/22    Patient verbalizes understanding of instructions and care plan provided today and agrees to view in [place]. Active MyChart status and patient understanding of how to access instructions and care plan via MyChart confirmed with patient.     The Managed Medicaid care management team will reach out to the patient again over the next 30 business  days.  The  Patient    has been provided with contact information for the Managed Medicaid care management team and has been advised to call with any health related questions or concerns.   Visit Information  Mr. Macmaster was given information about Medicaid Managed Care team care coordination services as a part of their [facility] Medicaid benefit. [name] verbally consented to engagement with the [facility] Managed Care team.   If you are experiencing a medical emergency, please call 911 or report to your local emergency department or urgent care.   If you have a non-emergency medical problem during routine  business hours, please contact your provider's office and ask to speak with a nurse.   For questions related to your [facility] health plan, please call: [phone] or go here:[email or web address]  If you would like to schedule transportation through your [facility] plan, please call the following number at least 2 days in advance of your appointment: [phone].  You can also use the MTM portal or MTM mobile app to manage your rides. For the portal, please go to [email or web address].  Call the [place] at [phone], at any time, 24 hours a day, 7 days a week. If you are in danger or need immediate medical attention call 911.  If you would like help to quit smoking, call 1-800-QUIT-NOW 937-780-7058) OR Espaol: 1-855-Djelo-Ya (1-779-390-3009) o para ms informacin haga clic aqu or Text READY to 200-400 to register via text  Mr. Carmer - following are the goals we discussed in your visit today:   Goals Addressed             This Visit's Progress    Protect My Health       Timeframe:  Long-Range Goal Priority:  High Start Date:    09/24/20                         Expected End Date:  ongoing             - schedule recommended health tests  - schedule and keep appointment for annual check-up  07/30/22:  patient up to date on all appts-appt with LVAD clinic 08/05/22    Following is a copy of your plan of care:  Care Plan : General Plan of Care (Adult)  Updates made by Gayla Medicus, RN since 07/30/2022 12:00 AM     Problem: Health Promotion or Disease Self-Management (General Plan of Care)   Priority: High  Onset Date: 08/18/2022  Note:   Current Barriers:  Chronic Disease Management support and education needs, S/P LVAD 07/21/21 07/30/22:  Patient with no complaints today, f/u appts scheduled.  No disability determination yet-patient will continue to f/u.  Nurse Case Manager Clinical Goal(s):  Over the next 90 days, patient will attend all scheduled medical appointments: Over the next 30 days, patient will have dental appt scheduled  Interventions:  Inter-disciplinary care team collaboration (see longitudinal plan of care) Collaborated with BSW regarding food stamps and dental resources. BSW referral for food stamp updates and dental resources-completed Evaluation of current treatment plan and patient's adherence to plan as established by provider.  BSW contacted patient regarding daycare voucher and foodstamps. BSW informed patient he would have to  contact DSS and have the child's name placed on the waitlist, patient can also apply for foodstamps at the same time. Patient reports no other resources needed at this time.  09/04/21: BSW spoke with patient, he stated he is not sure if they have been placed on the daycare voucher because his wife has not said anything to him yet. Patient stated they have applied for foodstamps but have not received them, he thinks its because his wife makes to much money. No other resources are needed at this time. 10/22/21: Bsw completed a follow up telephone call with patient regarding dental resources and follow up with his foodstamps. Patient stated he would like for dental resources to be mailed to him. Patient asked BSW if she could provide an update on his foodstamp application, BSW informed patient he would have to contact  DSS call center or his worker to get an update on his foodstamp application. No other resources are needed at this time. 12/15/21: BSW completed telephone outreach with patient. He states he did not receieve the resources for dental. BSW will resend letter. BSW informed patient it may benefit him to going up to DSS to inquire about his foodstamp application. Sometimes it is easier to go up there. Patient stated he was going to try the group session for now and is not interested in therapy, BSW did provide him with the information for [facility].No other resources are needed at this time. 01/02/22: BSW completed telephone outreach with patient. He states he did receive the letter with the resources and is waiting on call backs. Patient states he has not made it down to DSS yet to check on his foodstamp application. No other resources are needed at this time.  03/12/22: BSW competed telephone outreach with patient's wife. She stated they were able to get a daycare voucher and they did apply for foodstamps and she has to turn in her check stubs. She stated no other resources are needed at this time. Reviewed  medications with patient. Discussed plans with patient for ongoing care management follow up and provided patient with direct contact information for care management team Provided patient with educational materials related to exercise. Reviewed scheduled/upcoming provider appointments. Collaborated with Pharmacy Pharmacy referral for medication review  Patient Goals/Self-Care Activities Over the next 90 days, patient will:  -Attends all scheduled provider appointments Calls provider office for new concerns or questions  Follow Up Plan: The Managed Medicaid care management team will reach out to the patient again over the next 30 business  days.  The patient has been provided with contact information for the Managed Medicaid care management team and has been advised to call with any health related questions or concerns.

## 2022-08-03 ENCOUNTER — Encounter (HOSPITAL_COMMUNITY): Payer: Medicaid Other

## 2022-08-04 ENCOUNTER — Other Ambulatory Visit (HOSPITAL_COMMUNITY): Payer: Self-pay | Admitting: *Deleted

## 2022-08-04 DIAGNOSIS — Z95811 Presence of heart assist device: Secondary | ICD-10-CM

## 2022-08-04 DIAGNOSIS — Z79899 Other long term (current) drug therapy: Secondary | ICD-10-CM

## 2022-08-04 DIAGNOSIS — Z7901 Long term (current) use of anticoagulants: Secondary | ICD-10-CM

## 2022-08-05 ENCOUNTER — Other Ambulatory Visit: Payer: Self-pay

## 2022-08-05 ENCOUNTER — Ambulatory Visit (HOSPITAL_COMMUNITY): Payer: Self-pay | Admitting: Pharmacist

## 2022-08-05 ENCOUNTER — Ambulatory Visit (HOSPITAL_COMMUNITY)
Admission: RE | Admit: 2022-08-05 | Discharge: 2022-08-05 | Disposition: A | Discharge status: Home / Self Care | Payer: Medicaid Other | Source: Ambulatory Visit | Attending: Internal Medicine | Admitting: Internal Medicine

## 2022-08-05 VITALS — BP 124/0 | HR 77 | Ht 67.0 in | Wt 297.8 lb

## 2022-08-05 DIAGNOSIS — F32A Depression, unspecified: Secondary | ICD-10-CM | POA: Insufficient documentation

## 2022-08-05 DIAGNOSIS — Z85528 Personal history of other malignant neoplasm of kidney: Secondary | ICD-10-CM | POA: Insufficient documentation

## 2022-08-05 DIAGNOSIS — M1A9XX0 Chronic gout, unspecified, without tophus (tophi): Secondary | ICD-10-CM | POA: Diagnosis not present

## 2022-08-05 DIAGNOSIS — G4733 Obstructive sleep apnea (adult) (pediatric): Secondary | ICD-10-CM | POA: Insufficient documentation

## 2022-08-05 DIAGNOSIS — Z6841 Body Mass Index (BMI) 40.0 and over, adult: Secondary | ICD-10-CM | POA: Insufficient documentation

## 2022-08-05 DIAGNOSIS — I5023 Acute on chronic systolic (congestive) heart failure: Secondary | ICD-10-CM

## 2022-08-05 DIAGNOSIS — F17200 Nicotine dependence, unspecified, uncomplicated: Secondary | ICD-10-CM | POA: Insufficient documentation

## 2022-08-05 DIAGNOSIS — Z7901 Long term (current) use of anticoagulants: Secondary | ICD-10-CM

## 2022-08-05 DIAGNOSIS — I5022 Chronic systolic (congestive) heart failure: Secondary | ICD-10-CM | POA: Insufficient documentation

## 2022-08-05 DIAGNOSIS — E119 Type 2 diabetes mellitus without complications: Secondary | ICD-10-CM

## 2022-08-05 DIAGNOSIS — Z79899 Other long term (current) drug therapy: Secondary | ICD-10-CM | POA: Diagnosis not present

## 2022-08-05 DIAGNOSIS — T827XXA Infection and inflammatory reaction due to other cardiac and vascular devices, implants and grafts, initial encounter: Secondary | ICD-10-CM

## 2022-08-05 DIAGNOSIS — F419 Anxiety disorder, unspecified: Secondary | ICD-10-CM | POA: Diagnosis not present

## 2022-08-05 DIAGNOSIS — Z95811 Presence of heart assist device: Secondary | ICD-10-CM

## 2022-08-05 DIAGNOSIS — M109 Gout, unspecified: Secondary | ICD-10-CM | POA: Diagnosis not present

## 2022-08-05 DIAGNOSIS — Z905 Acquired absence of kidney: Secondary | ICD-10-CM | POA: Insufficient documentation

## 2022-08-05 DIAGNOSIS — E1122 Type 2 diabetes mellitus with diabetic chronic kidney disease: Secondary | ICD-10-CM | POA: Insufficient documentation

## 2022-08-05 DIAGNOSIS — I10 Essential (primary) hypertension: Secondary | ICD-10-CM

## 2022-08-05 DIAGNOSIS — I428 Other cardiomyopathies: Secondary | ICD-10-CM | POA: Insufficient documentation

## 2022-08-05 DIAGNOSIS — R739 Hyperglycemia, unspecified: Secondary | ICD-10-CM

## 2022-08-05 DIAGNOSIS — I480 Paroxysmal atrial fibrillation: Secondary | ICD-10-CM | POA: Insufficient documentation

## 2022-08-05 DIAGNOSIS — E1159 Type 2 diabetes mellitus with other circulatory complications: Secondary | ICD-10-CM | POA: Diagnosis not present

## 2022-08-05 DIAGNOSIS — I152 Hypertension secondary to endocrine disorders: Secondary | ICD-10-CM

## 2022-08-05 DIAGNOSIS — I130 Hypertensive heart and chronic kidney disease with heart failure and stage 1 through stage 4 chronic kidney disease, or unspecified chronic kidney disease: Secondary | ICD-10-CM | POA: Diagnosis not present

## 2022-08-05 DIAGNOSIS — N1832 Chronic kidney disease, stage 3b: Secondary | ICD-10-CM | POA: Insufficient documentation

## 2022-08-05 LAB — COMPREHENSIVE METABOLIC PANEL
ALT: 23 U/L (ref 0–44)
AST: 22 U/L (ref 15–41)
Albumin: 3.7 g/dL (ref 3.5–5.0)
Alkaline Phosphatase: 66 U/L (ref 38–126)
Anion gap: 6 (ref 5–15)
BUN: 23 mg/dL — ABNORMAL HIGH (ref 6–20)
CO2: 21 mmol/L — ABNORMAL LOW (ref 22–32)
Calcium: 9.7 mg/dL (ref 8.9–10.3)
Chloride: 114 mmol/L — ABNORMAL HIGH (ref 98–111)
Creatinine, Ser: 1.4 mg/dL — ABNORMAL HIGH (ref 0.61–1.24)
GFR, Estimated: >60 mL/min (ref >60)
Glucose, Bld: 111 mg/dL — ABNORMAL HIGH (ref 70–99)
Potassium: 3.8 mmol/L (ref 3.5–5.1)
Sodium: 141 mmol/L (ref 135–145)
Total Bilirubin: 0.6 mg/dL (ref 0.3–1.2)
Total Protein: 6.9 g/dL (ref 6.5–8.1)

## 2022-08-05 LAB — CBC
HCT: 42.6 % (ref 39.0–52.0)
Hemoglobin: 14.3 g/dL (ref 13.0–17.0)
MCH: 29.9 pg (ref 26.0–34.0)
MCHC: 33.6 g/dL (ref 30.0–36.0)
MCV: 88.9 fL (ref 80.0–100.0)
Platelets: 222 10*3/uL (ref 150–400)
RBC: 4.79 MIL/uL (ref 4.22–5.81)
RDW: 15.1 % (ref 11.5–15.5)
WBC: 5.9 10*3/uL (ref 4.0–10.5)
nRBC: 0 % (ref 0.0–0.2)

## 2022-08-05 LAB — PREALBUMIN: Prealbumin: 27 mg/dL (ref 18–38)

## 2022-08-05 LAB — TSH: TSH: 2.696 u[IU]/mL (ref 0.350–4.500)

## 2022-08-05 LAB — PROTIME-INR
INR: 1.3 — ABNORMAL HIGH (ref 0.8–1.2)
Prothrombin Time: 16.2 seconds — ABNORMAL HIGH (ref 11.4–15.2)

## 2022-08-05 LAB — LACTATE DEHYDROGENASE: LDH: 148 U/L (ref 98–192)

## 2022-08-05 MED ORDER — PANTOPRAZOLE SODIUM 40 MG PO TBEC
40.0000 mg | DELAYED_RELEASE_TABLET | Freq: Every day | ORAL | 3 refills | Status: DC
  Filled 2022-08-05: qty 90, 90d supply, fill #0
  Filled 2022-10-16: qty 90, 90d supply, fill #1
  Filled 2023-01-29: qty 90, 90d supply, fill #2
  Filled 2023-05-10: qty 30, 30d supply, fill #3
  Filled 2023-07-02: qty 30, 30d supply, fill #4

## 2022-08-05 MED ORDER — AMLODIPINE BESYLATE 10 MG PO TABS
10.0000 mg | ORAL_TABLET | Freq: Every day | ORAL | 6 refills | Status: DC
  Filled 2022-08-05: qty 30, 30d supply, fill #0
  Filled 2022-09-14: qty 30, 30d supply, fill #1
  Filled 2022-10-16: qty 30, 30d supply, fill #2
  Filled 2022-12-11: qty 30, 30d supply, fill #3
  Filled 2023-01-29: qty 30, 30d supply, fill #4

## 2022-08-05 MED ORDER — WARFARIN SODIUM 4 MG PO TABS
ORAL_TABLET | ORAL | 11 refills | Status: DC
  Filled 2022-08-05: qty 79, 42d supply, fill #0
  Filled 2022-08-05: qty 90, fill #0

## 2022-08-05 MED ORDER — TRAZODONE HCL 100 MG PO TABS
100.0000 mg | ORAL_TABLET | Freq: Every day | ORAL | 11 refills | Status: DC
  Filled 2022-08-05: qty 30, 30d supply, fill #0
  Filled 2022-09-25: qty 30, 30d supply, fill #1
  Filled 2022-10-16 – 2022-10-26 (×4): qty 30, 30d supply, fill #2
  Filled 2022-12-01: qty 30, 30d supply, fill #3
  Filled 2023-01-29: qty 30, 30d supply, fill #4
  Filled 2023-03-10: qty 30, 30d supply, fill #5
  Filled 2023-04-23: qty 30, 30d supply, fill #6
  Filled 2023-07-02: qty 30, 30d supply, fill #7

## 2022-08-05 MED ORDER — ALLOPURINOL 100 MG PO TABS
100.0000 mg | ORAL_TABLET | Freq: Every day | ORAL | 3 refills | Status: DC
  Filled 2022-08-05: qty 90, 90d supply, fill #0
  Filled 2022-12-11: qty 90, 90d supply, fill #1
  Filled 2023-04-28: qty 90, 90d supply, fill #2
  Filled 2023-04-28: qty 30, 30d supply, fill #2
  Filled 2023-06-02: qty 30, 30d supply, fill #3
  Filled 2023-07-02: qty 30, 30d supply, fill #4
  Filled 2023-08-03 – 2023-08-04 (×3): qty 30, 30d supply, fill #5

## 2022-08-05 MED ORDER — AMIODARONE HCL 200 MG PO TABS
200.0000 mg | ORAL_TABLET | Freq: Every day | ORAL | 6 refills | Status: DC
  Filled 2022-08-05: qty 30, 30d supply, fill #0
  Filled 2022-08-28 (×2): qty 30, 30d supply, fill #1
  Filled 2022-10-26: qty 30, 30d supply, fill #2
  Filled 2022-12-11: qty 30, 30d supply, fill #3
  Filled 2023-01-29: qty 30, 30d supply, fill #4
  Filled 2023-03-10: qty 30, 30d supply, fill #5
  Filled 2023-04-23: qty 30, 30d supply, fill #6

## 2022-08-05 MED ORDER — SPIRONOLACTONE 25 MG PO TABS
25.0000 mg | ORAL_TABLET | Freq: Every day | ORAL | 6 refills | Status: DC
Start: 2022-08-05 — End: 2022-09-11
  Filled 2022-08-05: qty 30, 30d supply, fill #0

## 2022-08-05 MED ORDER — ISOSORB DINITRATE-HYDRALAZINE 20-37.5 MG PO TABS
2.0000 | ORAL_TABLET | Freq: Three times a day (TID) | ORAL | 6 refills | Status: DC
  Filled 2022-08-05 – 2022-08-28 (×4): qty 180, 30d supply, fill #0

## 2022-08-05 MED ORDER — SERTRALINE HCL 100 MG PO TABS
150.0000 mg | ORAL_TABLET | Freq: Every day | ORAL | 6 refills | Status: DC
  Filled 2022-08-05: qty 45, 30d supply, fill #0
  Filled 2022-09-14: qty 45, 30d supply, fill #1
  Filled 2022-10-16: qty 45, 30d supply, fill #2
  Filled 2022-11-17: qty 45, 30d supply, fill #3
  Filled 2022-12-25: qty 45, 30d supply, fill #4
  Filled 2023-02-15: qty 45, 30d supply, fill #5
  Filled 2023-04-13 (×2): qty 45, 30d supply, fill #6

## 2022-08-05 MED ORDER — SACUBITRIL-VALSARTAN 97-103 MG PO TABS
1.0000 | ORAL_TABLET | Freq: Two times a day (BID) | ORAL | 6 refills | Status: DC
  Filled 2022-08-05: qty 60, 30d supply, fill #0
  Filled 2022-09-14: qty 60, 30d supply, fill #1
  Filled 2022-10-26: qty 60, 30d supply, fill #2

## 2022-08-05 MED ORDER — ENOXAPARIN SODIUM 60 MG/0.6ML IJ SOSY
60.0000 mg | PREFILLED_SYRINGE | Freq: Two times a day (BID) | INTRAMUSCULAR | 0 refills | Status: DC
  Filled 2022-08-05: qty 12, 10d supply, fill #0

## 2022-08-05 MED ORDER — GABAPENTIN 300 MG PO CAPS
300.0000 mg | ORAL_CAPSULE | Freq: Three times a day (TID) | ORAL | 6 refills | Status: DC
  Filled 2022-08-05: qty 90, 30d supply, fill #0

## 2022-08-05 MED ORDER — WARFARIN SODIUM 4 MG PO TABS
4.0000 mg | ORAL_TABLET | ORAL | 11 refills | Status: DC
  Filled 2022-08-05: qty 60, 35d supply, fill #0

## 2022-08-05 MED ORDER — METFORMIN HCL 1000 MG PO TABS
1000.0000 mg | ORAL_TABLET | Freq: Two times a day (BID) | ORAL | 11 refills | Status: DC
  Filled 2022-08-05: qty 60, 30d supply, fill #0

## 2022-08-05 MED ORDER — COLCHICINE 0.6 MG PO TABS
0.6000 mg | ORAL_TABLET | Freq: Every day | ORAL | 6 refills | Status: DC
  Filled 2022-08-05 – 2022-08-28 (×3): qty 30, 30d supply, fill #0
  Filled 2022-10-16: qty 30, 30d supply, fill #1
  Filled 2022-11-17: qty 30, 30d supply, fill #2
  Filled 2022-12-25: qty 30, 30d supply, fill #3
  Filled 2023-01-29: qty 30, 30d supply, fill #4
  Filled 2023-03-10: qty 30, 30d supply, fill #5
  Filled 2023-04-28: qty 30, 30d supply, fill #6

## 2022-08-05 NOTE — Progress Notes (Signed)
Patient presents for 2 mo follow up in [facility] today alone. Reports no problem with VAD or equipment.  This is pts 1 year visit. He forgot his equipment today. Pt was reminded that he will need to bring it at his next clinic visit.  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 Pt states that he has been going to the gym a couple times a week. He states that his limiting factor is knee pain. Pt is taking tylenol for this pain.  Pt is taking ozempic, his weight is down 8 lbs today.  Vital Signs:  Doppler Pressure: 124 Automatc BP:  111/88 (97) HR:  77 SPO2: 98% on RA   Weight: 297.8 lb w/ eqt Last weight:  305.4 lbs w/equt   VAD Indication: Destination Therapy due to smoking and BMI   VAD interrogation & Equipment Management: Speed: 6200 Flow: 4.8 Power: 5.3w    PI: 3.3 Hct:  42   Alarms: no clinical alarms Events: 10-30 daily  Fixed speed 6200 Low speed limit: 5900   Primary Controller:  Replace back up battery in 21 months. Back up controller:   Replace back up battery in 24 months.   Annual Equipment Maintenance on UBC/PM was performed on 07/21/21.    I reviewed the LVAD parameters from today and compared the results to the patient's prior recorded data. LVAD interrogation was NEGATIVE for significant power changes, NEGATIVE for clinical alarms and POSITIVE for PI events/speed drops. No programming changes were made and pump is functioning within specified parameters. Pt is performing daily controller and system monitor self tests along with completing weekly and monthly maintenance for LVAD equipment.   LVAD equipment check completed and is in good working order. Back-up equipment present.    Exit Site Care: Drive line is being maintained daily by [employer] his wife. Pt has weekly dressing on. Pt tells me that sometimes he uses the daily kit when he is going to the gym. Advised patient if any changes in appearance of exit site, he needs  to call VAD clinic. Pt verbalized understanding of same. Pt given 8 weekly kits, 7 daily kits and 10 anchors for home use.   Device:  Left subcutaneous dual ICD Therapies: on at 250 BPM Last check: 03/04/22   BP & Labs:  Doppler 124 - Doppler is reflecting Modified systolic   Hgb 85.6 - No S/S of bleeding. Specifically denies melena/BRBPR or nosebleeds.   LDH stable at 148 with established baseline of 160 - 300. Denies tea-colored urine. No power elevations noted on interrogation.   1 year Intermacs follow up completed including:  Quality of Life, KCCQ-12, and Neurocognitive trail making.   Pt completed 1140 feet during 6 minute walk.  Back up controller:  11V backup battery charged during this visit.   [place] Cardiomyopathy Questionnaire     08/05/2022   11:46 AM 01/28/2022   10:41 AM 10/23/2021    1:38 PM  KCCQ-12  1 a. Ability to shower/bathe Moderately limited Not at all limited Extremely limited  1 b. Ability to walk 1 block Moderately limited Slightly limited Slightly limited  1 c. Ability to hurry/jog Extremely limited Quite a bit limited Quite a bit limited  2. Edema feet/ankles/legs 1-2 times a week Never over the past 2 weeks Never over the past 2 weeks  3. Limited by fatigue 3+ times per week, not every day 1-2 times a week 3+ times per week, not every day  4. Limited by dyspnea 3+ times a week, not every day 1-2 times a week Less than once a week  5. Sitting up / on 3+ pillows Never over the past 2 weeks Never over the past 2 weeks Never over the past 2 weeks  6. Limited enjoyment of life Moderately limited Slightly limited Moderately limited  7. Rest of life w/ symptoms Somewhat satisfied Somewhat satisfied Mostly satisfied  8 a. Participation in hobbies Moderately limited Limited quite a bit Limited quite a bit  8 b. Participation in chores Limited quite a bit Slightly limited Limited quite a bit  8 c. Visiting family/friends Limited quite a bit Slightly  limited Severely limited       Plan:  No change in medications A refill has been sent to your pharmacy for all your medications except insulin. PharmD will call with INR results and warfarin dosing.  Return to [facility] in 2 months. Please bring your home VAD equipment (Charity fundraiser and MPU) for annual maintenance.   Tanda Rockers RN [place] Coordinator  Office: [phone]  24/7 Pager: [phone]

## 2022-08-05 NOTE — Patient Instructions (Signed)
  No change in medications A refill has been sent to your pharmacy for all your medications except insulin. PharmD will call with INR results and warfarin dosing.  Return to [facility] in 2 months. Please bring your home VAD equipment (Charity fundraiser and MPU) for annual maintenance.

## 2022-08-05 NOTE — Progress Notes (Signed)
LVAD INR 

## 2022-08-06 ENCOUNTER — Other Ambulatory Visit (HOSPITAL_COMMUNITY): Payer: Self-pay

## 2022-08-06 ENCOUNTER — Other Ambulatory Visit: Payer: Self-pay

## 2022-08-06 DIAGNOSIS — Z95811 Presence of heart assist device: Secondary | ICD-10-CM

## 2022-08-06 DIAGNOSIS — Z7901 Long term (current) use of anticoagulants: Secondary | ICD-10-CM

## 2022-08-06 LAB — T4: T4, Total: 4.6 ug/dL (ref 4.5–12.0)

## 2022-08-06 NOTE — Addendum Note (Signed)
Encounter addended by: Jolaine Artist, MD on: 08/06/2022 5:54 PM  Actions taken: Clinical Note Signed, Level of Service modified, Visit diagnoses modified, Charge Capture section accepted

## 2022-08-06 NOTE — Progress Notes (Signed)
VAD CLINIC Follow-up Visit  PCP: Teena Dunk, NP HF MD: DB   HPI:  Jeffrey [name] is a 46 y.o. male with a history of  poorly controlled HTN, R renal cell carcinoma s/p nephrectomy, CKD IIIa, DM2, OSA, gout, morbid obesity and systolic HF due to NICM. S/p HM-3 VAD placement 07/21/21  Has [place]-sci S-ICD   HF dates back to 2012 with EF 25%   Admitted 7/22 with CHF/cardiogenic shock in setting AF with RVR. He was placed on amio gtt and underwent cardioversion. Echo EF < 20%, severe LV dilation, moderate RV dysfunction. Creatinine peaked at 4.65 improved to 1.58 which is his baselin  Underwent HM3 LVAD by Dr. Cyndia Bent on 07/21/21. Had post-op AF requiring DC-CV.  Follow up for Heart Failure/LVAD:  Here for routine VAD f/u. Doing well. 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 Denies SOB, edema. Has lost 8 pounds on ozempic. Denies orthopnea or PND. No fevers, chills or problems with driveline. No bleeding, melena or neuro symptoms. No VAD alarms. Taking all meds as prescribed.    VAD Indication: Destination Therapy due to smoking and BMI   VAD interrogation & Equipment Management: Speed: 6200 Flow: 4.8 Power: 5.3w    PI: 3.3 Hct:  42   Alarms: no clinical alarms Events: 10-30 daily  Fixed speed 6200 Low speed limit: 5900   Primary Controller:  Replace back up battery in 21 months. Back up controller:   Replace back up battery in 24 months.   Annual Equipment Maintenance on UBC/PM was performed on 07/21/21.    I reviewed the LVAD parameters from today and compared the results to the patient's prior recorded data. LVAD interrogation was NEGATIVE for significant power changes, NEGATIVE for clinical alarms and POSITIVE for PI events/speed drops. No programming changes were made and pump is functioning within specified parameters. Pt is performing daily controller and system monitor self tests along with completing weekly and monthly maintenance for LVAD equipment.    LVAD equipment check completed and is in good working order. Back-up equipment present.   Past Medical History:  Diagnosis Date   Anxiety    Aspirin allergy    Childhood asthma    Chronic systolic CHF (congestive heart failure) ([place])    a. EF 20-25% in 2012. b. EF 45-50% in 10/2011 with nonischemic nuc - presumed NICM. c. 12/2014 Echo: Sev depressed LV fxn, sev dil LV, mild LVH, mild MR, sev dil LA, mildly reduced RV fxn.   CKD (chronic kidney disease) stage 2, GFR 60-89 ml/min    H/O vasectomy 12/2019   High cholesterol    Hypertension    Morbid obesity ([place])    Nephrolithiasis    OSA on CPAP    Paroxysmal atrial fibrillation ([place])    Presumed NICM    a. 04/2014 Myoview: EF 26%, glob HK, sev glob HK, ? prior infarct;  b. Never cathed 2/2 CKD.   Renal cell carcinoma ([place])    a. s/p Rt robotic assisted partial converted to radical nephrectomy on 01/2013.   Troponin level elevated    a. 04/2014, 12/2014: felt due to CHF.   Type II diabetes mellitus (HCC)    Ventricular tachycardia (HCC)    a. appropriate ICD therapy 12/2017    Current Outpatient Medications  Medication Sig Dispense Refill   Accu-Chek FastClix Lancets MISC USE AS DIRECTED FOUR TIMES DAILY 102 each 11   acetaminophen (TYLENOL) 325 MG tablet Take 2 tablets (650 mg total) by mouth  every 6 (six) hours as needed for fever.     diclofenac Sodium (VOLTAREN) 1 % GEL Apply 2 g topically 4 (four) times daily. (Patient taking differently: Apply 2 g topically 4 (four) times daily as needed (pain).) 100 g 1   doxazosin (CARDURA) 1 MG tablet Take 1 tablet (1 mg total) by mouth daily. 30 tablet 6   glucose blood test strip Use as directed 4 times daily. 100 each 11   insulin aspart protamine - aspart (NOVOLOG MIX 70/30 FLEXPEN) (70-30) 100 UNIT/ML FlexPen Inject 38 Units into the skin 2 (two) times daily. (Patient taking differently: Inject 34 Units into the skin 2 (two) times daily.) 15 mL 11   Insulin Pen Needle (TRUEPLUS 5-BEVEL PEN  NEEDLES) 29G X 12.7MM MISC Use as directed in the morning, at noon, and at bedtime. 100 each 3   Insulin Syringe-Needle U-100 (TRUEPLUS INSULIN SYRINGE) 31G X 5/16" 0.5 ML MISC USE TO INJECT INSULIN TWICE DAILY. 100 each 11   meclizine (ANTIVERT) 25 MG tablet Take 1 tablet (25 mg total) by mouth 3 (three) times daily as needed for dizziness. 90 tablet 2   Semaglutide, 2 MG/DOSE, (OZEMPIC, 2 MG/DOSE,) 8 MG/3ML SOPN Inject 2 mg into the skin once a week. 3 mL 11   allopurinol (ZYLOPRIM) 100 MG tablet Take 1 tablet (100 mg total) by mouth daily. 90 tablet 3   amiodarone (PACERONE) 200 MG tablet Take 1 tablet (200 mg total) by mouth daily. 30 tablet 6   amLODipine (NORVASC) 10 MG tablet Take 1 tablet (10 mg total) by mouth daily 30 tablet 6   colchicine 0.6 MG tablet Take 1 tablet (0.6 mg total) by mouth once daily. 30 tablet 6   docusate sodium (COLACE) 100 MG capsule Take 1 capsule (100 mg total) by mouth 2 (two) times daily. (Patient not taking: Reported on 08/05/2022) 10 capsule 0   enoxaparin (LOVENOX) 60 MG/0.6ML injection Inject 0.6 mLs (60 mg total) into the skin every 12 (twelve) hours. 12 mL 0   gabapentin (NEURONTIN) 300 MG capsule Take 1 capsule (300 mg total) by mouth 3 (three) times daily. 90 capsule 6   isosorbide-hydrALAZINE (BIDIL) 20-37.5 MG tablet Take 2 tablets by mouth 3 (three) times daily. 180 tablet 6   metFORMIN (GLUCOPHAGE) 1000 MG tablet Take 1 tablet (1,000 mg total) by mouth 2 (two) times daily with a meal. 60 tablet 11   pantoprazole (PROTONIX) 40 MG tablet Take 1 tablet (40 mg total) by mouth once daily. 90 tablet 3   sacubitril-valsartan (ENTRESTO) 97-103 MG Take 1 tablet by mouth 2 (two) times daily. 60 tablet 6   sertraline (ZOLOFT) 100 MG tablet Take 1.5 tablets (150 mg total) by mouth daily. 45 tablet 6   spironolactone (ALDACTONE) 25 MG tablet Take 1 tablet (25 mg total) by mouth daily. 30 tablet 6   torsemide (DEMADEX) 20 MG tablet Take 1 tablet (20 mg total) by  mouth daily as needed (If wt gain > 3 lb in 24 hr or > 5 lb in 1 week). (Patient not taking: Reported on 08/05/2022) 30 tablet 2   traMADol (ULTRAM) 50 MG tablet Take 1-2 tablets (50-100 mg total) by mouth 2 (two) times daily. (Patient not taking: Reported on 08/05/2022) 60 tablet 5   traZODone (DESYREL) 100 MG tablet Take 1 tablet (100 mg total) by mouth at bedtime. 30 tablet 11   warfarin (COUMADIN) 4 MG tablet Take 1 tablet (4 mg) every Tuesday and 2 tablets (8 mg) all  other days or as directed by HF clinic 90 tablet 11   No current facility-administered medications for this encounter.    Aspirin, Bee venom, Lisinopril, Shrimp (diagnostic), and Tomato  REVIEW OF SYSTEMS: All systems negative except as listed in HPI, PMH and Problem list.   Vitals:   08/05/22 1100 08/05/22 1123 08/05/22 1124  BP:  111/88 (!) 124/0  Pulse:  77   SpO2:  98%   Weight: 135.1 kg (297 lb 12.8 oz)    Height: '5\' 7"'$  (1.702 m)     Vital Signs:  Doppler Pressure: 124 Automatc BP:  111/88 (97) HR:  77 SPO2: 98% on RA   Weight: 297.8 lb w/ eqt Last weight:  305.4 lbs w/equt   Exam: General:  NAD.  HEENT: normal  Neck: supple. JVP not elevated.  Carotids 2+ bilat; no bruits. No lymphadenopathy or thryomegaly appreciated. Cor: LVAD hum.  Lungs: Clear. Abdomen: obese soft, nontender, non-distended. No hepatosplenomegaly. No bruits or masses. Good bowel sounds. Driveline site clean. Anchor in place.  Extremities: no cyanosis, clubbing, rash. Warm no edema  Neuro: alert & oriented x 3. No focal deficits. Moves all 4 without problem     ASSESSMENT AND PLAN:  .1. Chronic systolic CHF - Nonischemic CM, end-stage - EF 2012; 20-25%  - Echo  8/22: EF < 20%, severe LV dilation, moderate RV dysfunction.   - RHC (8/22): well compensated hemodynamics. - s/p S-ICD - S/p HM3 LVAD 10/17 (DT) - Doing great NYHA I. Volume status looks good.  - MAPs ok - Continue Bidil 2 tabs tid - Continue spironolactone 25 mg  daily  - Continue entresto to 97/130 bid - Stressed need to continue weight loss efforts (see below). Continue Ozempic. Once BMI closer to 35 will refer to [facility] for begin transplant evaluation (Body mass index is 46.64 kg/m.)   2. VAD  - s/p HM-3 placement 07/21/21  - VAD interrogated personally. Parameters stable. - INR 1.3 Goal INR 2.0-3.0 Discussed dosing with PharmD personally. - LDH 148 - DL site ok - Hgb 14.3  3. DL site infection - resolved - looks good  4 Atrial fibrillation: Paroxysmal - Had severe AF with RVR 7/22 s/p DC-CV x 2 - Seen by EP. Not a candidate for ablation due to body habitus  (could consider AVN ablation and CRT, if needed)  - Had post-op AF with RVR post VAD - 10/23 S/P DC-CV with restoration of NSR. - Remains in NSR Continue amio 100 daily - On warfarin. INR 1.3. Management as above   5. HTN - Blood pressure well controlled. Continue current regimen.  6. CKD stage 3B  - Has solitary kidney.   - Baseline creatinine is 1.5-1.8.  - Creatinine 1.40 today   7. OSA - Continue CPAP   8. DM2  - On Jardiance. - Hgb A1c 9.4. -> 5.9 - PCP following  9. Polyarticular gout. - quiescent  10. Depression/anxiety - Continue Zoloft '150mg'$  daily - Much improved with coaching. Great to see  11. Morbid obesity - Body mass index is 46.64 kg/m. - Weight coming down - Continue GLP1RA  Total time spent 35 minutes. Over half that time spent discussing above.    Glori Bickers, MD  5:49 PM

## 2022-08-07 ENCOUNTER — Other Ambulatory Visit: Payer: Self-pay

## 2022-08-10 ENCOUNTER — Inpatient Hospital Stay (HOSPITAL_COMMUNITY)
Admission: AD | Admit: 2022-08-10 | Discharge: 2022-08-26 | DRG: 264 | Disposition: A | Discharge status: Home / Self Care | Payer: Medicaid Other | Attending: Internal Medicine | Admitting: Internal Medicine

## 2022-08-10 ENCOUNTER — Encounter (HOSPITAL_COMMUNITY): Payer: Self-pay

## 2022-08-10 ENCOUNTER — Ambulatory Visit (HOSPITAL_COMMUNITY)
Admission: RE | Admit: 2022-08-10 | Discharge: 2022-08-10 | Disposition: A | Discharge status: Home / Self Care | Payer: Medicaid Other | Source: Ambulatory Visit | Attending: Cardiology | Admitting: Cardiology

## 2022-08-10 ENCOUNTER — Inpatient Hospital Stay (HOSPITAL_COMMUNITY): Payer: Medicaid Other

## 2022-08-10 DIAGNOSIS — Z905 Acquired absence of kidney: Secondary | ICD-10-CM | POA: Diagnosis not present

## 2022-08-10 DIAGNOSIS — L03311 Cellulitis of abdominal wall: Secondary | ICD-10-CM | POA: Diagnosis not present

## 2022-08-10 DIAGNOSIS — Z85528 Personal history of other malignant neoplasm of kidney: Secondary | ICD-10-CM

## 2022-08-10 DIAGNOSIS — Z886 Allergy status to analgesic agent status: Secondary | ICD-10-CM

## 2022-08-10 DIAGNOSIS — I10 Essential (primary) hypertension: Secondary | ICD-10-CM

## 2022-08-10 DIAGNOSIS — Z7901 Long term (current) use of anticoagulants: Secondary | ICD-10-CM | POA: Diagnosis not present

## 2022-08-10 DIAGNOSIS — Y838 Other surgical procedures as the cause of abnormal reaction of the patient, or of later complication, without mention of misadventure at the time of the procedure: Secondary | ICD-10-CM | POA: Diagnosis present

## 2022-08-10 DIAGNOSIS — E861 Hypovolemia: Secondary | ICD-10-CM | POA: Diagnosis not present

## 2022-08-10 DIAGNOSIS — Z6841 Body Mass Index (BMI) 40.0 and over, adult: Secondary | ICD-10-CM

## 2022-08-10 DIAGNOSIS — Z8619 Personal history of other infectious and parasitic diseases: Secondary | ICD-10-CM

## 2022-08-10 DIAGNOSIS — I5022 Chronic systolic (congestive) heart failure: Secondary | ICD-10-CM | POA: Diagnosis present

## 2022-08-10 DIAGNOSIS — G4733 Obstructive sleep apnea (adult) (pediatric): Secondary | ICD-10-CM | POA: Diagnosis present

## 2022-08-10 DIAGNOSIS — T827XXA Infection and inflammatory reaction due to other cardiac and vascular devices, implants and grafts, initial encounter: Principal | ICD-10-CM | POA: Diagnosis present

## 2022-08-10 DIAGNOSIS — Z833 Family history of diabetes mellitus: Secondary | ICD-10-CM

## 2022-08-10 DIAGNOSIS — Z87891 Personal history of nicotine dependence: Secondary | ICD-10-CM

## 2022-08-10 DIAGNOSIS — Z87442 Personal history of urinary calculi: Secondary | ICD-10-CM

## 2022-08-10 DIAGNOSIS — M109 Gout, unspecified: Secondary | ICD-10-CM | POA: Diagnosis present

## 2022-08-10 DIAGNOSIS — E1122 Type 2 diabetes mellitus with diabetic chronic kidney disease: Secondary | ICD-10-CM | POA: Diagnosis present

## 2022-08-10 DIAGNOSIS — N1832 Chronic kidney disease, stage 3b: Secondary | ICD-10-CM | POA: Diagnosis present

## 2022-08-10 DIAGNOSIS — L089 Local infection of the skin and subcutaneous tissue, unspecified: Secondary | ICD-10-CM | POA: Diagnosis not present

## 2022-08-10 DIAGNOSIS — Z91018 Allergy to other foods: Secondary | ICD-10-CM

## 2022-08-10 DIAGNOSIS — E7800 Pure hypercholesterolemia, unspecified: Secondary | ICD-10-CM | POA: Diagnosis not present

## 2022-08-10 DIAGNOSIS — Z888 Allergy status to other drugs, medicaments and biological substances status: Secondary | ICD-10-CM

## 2022-08-10 DIAGNOSIS — F32A Depression, unspecified: Secondary | ICD-10-CM | POA: Diagnosis not present

## 2022-08-10 DIAGNOSIS — Z91013 Allergy to seafood: Secondary | ICD-10-CM

## 2022-08-10 DIAGNOSIS — E1159 Type 2 diabetes mellitus with other circulatory complications: Secondary | ICD-10-CM

## 2022-08-10 DIAGNOSIS — Z95811 Presence of heart assist device: Secondary | ICD-10-CM | POA: Insufficient documentation

## 2022-08-10 DIAGNOSIS — Z9049 Acquired absence of other specified parts of digestive tract: Secondary | ICD-10-CM

## 2022-08-10 DIAGNOSIS — Z7984 Long term (current) use of oral hypoglycemic drugs: Secondary | ICD-10-CM

## 2022-08-10 DIAGNOSIS — N189 Chronic kidney disease, unspecified: Secondary | ICD-10-CM | POA: Diagnosis not present

## 2022-08-10 DIAGNOSIS — I480 Paroxysmal atrial fibrillation: Secondary | ICD-10-CM | POA: Diagnosis present

## 2022-08-10 DIAGNOSIS — I509 Heart failure, unspecified: Secondary | ICD-10-CM | POA: Diagnosis not present

## 2022-08-10 DIAGNOSIS — I428 Other cardiomyopathies: Secondary | ICD-10-CM | POA: Diagnosis not present

## 2022-08-10 DIAGNOSIS — I130 Hypertensive heart and chronic kidney disease with heart failure and stage 1 through stage 4 chronic kidney disease, or unspecified chronic kidney disease: Secondary | ICD-10-CM | POA: Diagnosis present

## 2022-08-10 DIAGNOSIS — Z9852 Vasectomy status: Secondary | ICD-10-CM

## 2022-08-10 DIAGNOSIS — Z79899 Other long term (current) drug therapy: Secondary | ICD-10-CM | POA: Diagnosis not present

## 2022-08-10 DIAGNOSIS — F419 Anxiety disorder, unspecified: Secondary | ICD-10-CM | POA: Diagnosis present

## 2022-08-10 DIAGNOSIS — Z8249 Family history of ischemic heart disease and other diseases of the circulatory system: Secondary | ICD-10-CM

## 2022-08-10 DIAGNOSIS — Z794 Long term (current) use of insulin: Secondary | ICD-10-CM | POA: Diagnosis not present

## 2022-08-10 LAB — CBC
HCT: 41.9 % (ref 39.0–52.0)
Hemoglobin: 13.9 g/dL (ref 13.0–17.0)
MCH: 29.8 pg (ref 26.0–34.0)
MCHC: 33.2 g/dL (ref 30.0–36.0)
MCV: 89.7 fL (ref 80.0–100.0)
Platelets: 218 10*3/uL (ref 150–400)
RBC: 4.67 MIL/uL (ref 4.22–5.81)
RDW: 14.6 % (ref 11.5–15.5)
WBC: 11.9 10*3/uL — ABNORMAL HIGH (ref 4.0–10.5)
nRBC: 0 % (ref 0.0–0.2)

## 2022-08-10 LAB — COMPREHENSIVE METABOLIC PANEL
ALT: 16 U/L (ref 0–44)
AST: 19 U/L (ref 15–41)
Albumin: 3.4 g/dL — ABNORMAL LOW (ref 3.5–5.0)
Alkaline Phosphatase: 41 U/L (ref 38–126)
Anion gap: 9 (ref 5–15)
BUN: 17 mg/dL (ref 6–20)
CO2: 21 mmol/L — ABNORMAL LOW (ref 22–32)
Calcium: 9.5 mg/dL (ref 8.9–10.3)
Chloride: 108 mmol/L (ref 98–111)
Creatinine, Ser: 1.41 mg/dL — ABNORMAL HIGH (ref 0.61–1.24)
GFR, Estimated: >60 mL/min (ref >60)
Glucose, Bld: 101 mg/dL — ABNORMAL HIGH (ref 70–99)
Potassium: 4 mmol/L (ref 3.5–5.1)
Sodium: 138 mmol/L (ref 135–145)
Total Bilirubin: 1.1 mg/dL (ref 0.3–1.2)
Total Protein: 6.9 g/dL (ref 6.5–8.1)

## 2022-08-10 LAB — GLUCOSE, CAPILLARY: Glucose-Capillary: 144 mg/dL — ABNORMAL HIGH (ref 70–99)

## 2022-08-10 LAB — PROTIME-INR
INR: 1.6 — ABNORMAL HIGH (ref 0.8–1.2)
Prothrombin Time: 18.7 seconds — ABNORMAL HIGH (ref 11.4–15.2)

## 2022-08-10 LAB — MRSA NEXT GEN BY PCR, NASAL: MRSA by PCR Next Gen: NOT DETECTED

## 2022-08-10 LAB — LACTATE DEHYDROGENASE: LDH: 175 U/L (ref 98–192)

## 2022-08-10 MED ORDER — PANTOPRAZOLE SODIUM 40 MG PO TBEC
40.0000 mg | DELAYED_RELEASE_TABLET | Freq: Every day | ORAL | Status: DC
  Administered 2022-08-10 – 2022-08-26 (×17): 40 mg via ORAL
  Filled 2022-08-10 (×17): qty 1

## 2022-08-10 MED ORDER — TRAMADOL HCL 50 MG PO TABS
50.0000 mg | ORAL_TABLET | Freq: Four times a day (QID) | ORAL | Status: DC | PRN
  Administered 2022-08-10 – 2022-08-11 (×2): 50 mg via ORAL
  Filled 2022-08-10 (×2): qty 1

## 2022-08-10 MED ORDER — VANCOMYCIN HCL 1750 MG/350ML IV SOLN
1750.0000 mg | INTRAVENOUS | Status: DC
  Administered 2022-08-11: 1750 mg via INTRAVENOUS
  Filled 2022-08-10: qty 350

## 2022-08-10 MED ORDER — OXYCODONE HCL 5 MG PO TABS
5.0000 mg | ORAL_TABLET | Freq: Four times a day (QID) | ORAL | Status: DC | PRN
  Administered 2022-08-10 – 2022-08-11 (×2): 5 mg via ORAL
  Filled 2022-08-10 (×2): qty 1

## 2022-08-10 MED ORDER — INSULIN ASPART PROT & ASPART (70-30 MIX) 100 UNIT/ML ~~LOC~~ SUSP
34.0000 [IU] | Freq: Two times a day (BID) | SUBCUTANEOUS | Status: DC
  Administered 2022-08-10 – 2022-08-13 (×4): 34 [IU] via SUBCUTANEOUS
  Filled 2022-08-10: qty 10

## 2022-08-10 MED ORDER — METFORMIN HCL 500 MG PO TABS
1000.0000 mg | ORAL_TABLET | Freq: Two times a day (BID) | ORAL | Status: DC
  Administered 2022-08-11 – 2022-08-26 (×28): 1000 mg via ORAL
  Filled 2022-08-10 (×28): qty 2

## 2022-08-10 MED ORDER — DOXAZOSIN MESYLATE 1 MG PO TABS
1.0000 mg | ORAL_TABLET | Freq: Every day | ORAL | Status: DC
  Administered 2022-08-10 – 2022-08-22 (×11): 1 mg via ORAL
  Filled 2022-08-10 (×13): qty 1

## 2022-08-10 MED ORDER — HEPARIN (PORCINE) 25000 UT/250ML-% IV SOLN: 1200.0000 [IU]/h | INTRAVENOUS | Status: DC

## 2022-08-10 MED ORDER — VANCOMYCIN HCL 2000 MG/400ML IV SOLN
2000.0000 mg | Freq: Once | INTRAVENOUS | Status: AC
  Administered 2022-08-10: 2000 mg via INTRAVENOUS
  Filled 2022-08-10: qty 400

## 2022-08-10 MED ORDER — COLCHICINE 0.6 MG PO TABS
0.6000 mg | ORAL_TABLET | Freq: Every day | ORAL | Status: DC
  Administered 2022-08-10 – 2022-08-26 (×17): 0.6 mg via ORAL
  Filled 2022-08-10 (×17): qty 1

## 2022-08-10 MED ORDER — SODIUM CHLORIDE 0.9 % IV SOLN
2.0000 g | Freq: Three times a day (TID) | INTRAVENOUS | Status: DC
  Administered 2022-08-10 – 2022-08-11 (×3): 2 g via INTRAVENOUS
  Filled 2022-08-10 (×3): qty 12.5

## 2022-08-10 MED ORDER — AMIODARONE HCL 200 MG PO TABS
200.0000 mg | ORAL_TABLET | Freq: Every day | ORAL | Status: DC
  Administered 2022-08-11 – 2022-08-26 (×16): 200 mg via ORAL
  Filled 2022-08-10 (×16): qty 1

## 2022-08-10 MED ORDER — INSULIN ASPART PROT & ASPART (70-30 MIX) 100 UNIT/ML PEN: 34.0000 [IU] | PEN_INJECTOR | Freq: Two times a day (BID) | SUBCUTANEOUS | Status: DC

## 2022-08-10 MED ORDER — NALOXONE HCL 0.4 MG/ML IJ SOLN: 0.4000 mg | INTRAMUSCULAR | Status: DC | PRN

## 2022-08-10 MED ORDER — HEPARIN (PORCINE) 25000 UT/250ML-% IV SOLN
700.0000 [IU]/h | INTRAVENOUS | Status: DC
  Administered 2022-08-11: 1200 [IU]/h via INTRAVENOUS
  Filled 2022-08-10 (×2): qty 250

## 2022-08-10 MED ORDER — SACUBITRIL-VALSARTAN 97-103 MG PO TABS
1.0000 | ORAL_TABLET | Freq: Two times a day (BID) | ORAL | Status: DC
  Administered 2022-08-10 – 2022-08-26 (×30): 1 via ORAL
  Filled 2022-08-10 (×34): qty 1

## 2022-08-10 MED ORDER — GABAPENTIN 300 MG PO CAPS
300.0000 mg | ORAL_CAPSULE | Freq: Three times a day (TID) | ORAL | Status: DC
  Administered 2022-08-10 – 2022-08-24 (×41): 300 mg via ORAL
  Filled 2022-08-10 (×42): qty 1

## 2022-08-10 MED ORDER — ACETAMINOPHEN 325 MG PO TABS
650.0000 mg | ORAL_TABLET | Freq: Four times a day (QID) | ORAL | Status: DC | PRN
  Administered 2022-08-10: 650 mg via ORAL
  Filled 2022-08-10: qty 2

## 2022-08-10 MED ORDER — AMLODIPINE BESYLATE 10 MG PO TABS
10.0000 mg | ORAL_TABLET | Freq: Every day | ORAL | Status: DC
  Administered 2022-08-10 – 2022-08-26 (×17): 10 mg via ORAL
  Filled 2022-08-10 (×17): qty 1

## 2022-08-10 MED ORDER — ISOSORB DINITRATE-HYDRALAZINE 20-37.5 MG PO TABS
2.0000 | ORAL_TABLET | Freq: Three times a day (TID) | ORAL | Status: DC
  Administered 2022-08-11 – 2022-08-26 (×43): 2 via ORAL
  Filled 2022-08-10 (×47): qty 2

## 2022-08-10 MED ORDER — SPIRONOLACTONE 25 MG PO TABS
25.0000 mg | ORAL_TABLET | Freq: Every day | ORAL | Status: DC
  Administered 2022-08-11 – 2022-08-26 (×16): 25 mg via ORAL
  Filled 2022-08-10 (×16): qty 1

## 2022-08-10 MED ORDER — ACETAMINOPHEN 325 MG PO TABS: 650.0000 mg | ORAL_TABLET | Freq: Four times a day (QID) | ORAL | Status: DC | PRN

## 2022-08-10 MED ORDER — TRAZODONE HCL 50 MG PO TABS
100.0000 mg | ORAL_TABLET | Freq: Every day | ORAL | Status: DC
  Administered 2022-08-10 – 2022-08-25 (×16): 100 mg via ORAL
  Filled 2022-08-10 (×16): qty 2

## 2022-08-10 MED ORDER — ALLOPURINOL 100 MG PO TABS
100.0000 mg | ORAL_TABLET | Freq: Every day | ORAL | Status: DC
  Administered 2022-08-10 – 2022-08-26 (×17): 100 mg via ORAL
  Filled 2022-08-10 (×17): qty 1

## 2022-08-10 MED ORDER — SERTRALINE HCL 25 MG PO TABS
150.0000 mg | ORAL_TABLET | Freq: Every day | ORAL | Status: DC
  Administered 2022-08-11 – 2022-08-26 (×16): 150 mg via ORAL
  Filled 2022-08-10: qty 2
  Filled 2022-08-10: qty 1
  Filled 2022-08-10 (×4): qty 2
  Filled 2022-08-10: qty 1
  Filled 2022-08-10 (×4): qty 2
  Filled 2022-08-10 (×2): qty 1
  Filled 2022-08-10 (×3): qty 2

## 2022-08-10 NOTE — Progress Notes (Signed)
Patient called Jeffrey [name] this afternoon reporting severe pain at drive line site. Decision made to bring patient into [name] for evaluation. Patient notably in pain on arrival to [name]. Abdominal cellultiis noted on assessment that was hardened and painful to touch. Drive line had moderate amount of serosanguineous drainage. Patient does have low grade fever at 99.8 and chills. Patient states he did not take any of his medications today because he was feeling so poorly.  Patient does not recall trauma to the site. Last dressing change was Saturday by Jeffrey [name] his wife. Denies lightheadedness, dizziness, falls, shortness of breath, and signs of bleeding.   [name] saw patient in [name]. Decision made to direct admit to [facility] for CT scan and IV antibiotics. Jeffrey [name] made aware and will view CT imaging to determine if debridement is needed. 2H charge nurse Altha Harm made aware. Back up equipment at bedside with patient.  Exit Site Care: Existing Jeffrey dressing removed and site care performed using sterile technique. Drive line exit site cleaned with Chlora prep applicators x 2, allowed to dry. Moderate amount of serosanguineous drainage. Exit site unincorporated, the velour is fully implanted at exit site. Site had moderate redness, tenderness and drainage. No foul odor or rash noted. Drive line anchor re-applied.       Vital Signs:  Automatic BP cuff:149/131(138) patient states he has not taken his medications today due to feeling poorly Temp: 99.8   Jeffrey Indication: Destination Therapy due to smoking and BMI   Jeffrey interrogation & Equipment Management: Speed: 6200 Flow: 4.4 Power: 5.2w    PI: 4.2   Alarms: no clinical alarms Events: 20-30 daily  Fixed speed 6200 Low speed limit: 5900   Primary Controller:  Replace back up battery in 21 months. Back up controller:   Replace back up battery in 24 months.   I reviewed the LVAD parameters from today and compared the results to the  patient's prior recorded data. LVAD interrogation was NEGATIVE for significant power changes, NEGATIVE for clinical alarms and POSITIVE for PI events/speed drops. No programming changes were made and pump is functioning within specified parameters. Pt is performing daily controller and system monitor self tests along with completing weekly and monthly maintenance for LVAD equipment.   LVAD equipment check completed and is in good working order. Back-up equipment present.   Bobbye Morton RN,BSN [place] Coordinator  Office: [phone]  24/7 Pager: [phone]

## 2022-08-10 NOTE — Progress Notes (Addendum)
ANTICOAGULATION CONSULT NOTE - Initial Consult  Pharmacy Consult for Heparin Indication:  VAD  Allergies  Allergen Reactions   Aspirin Shortness Of Breath, Itching and Rash     Burning sensation (Patient reports he tolerates other NSAIDS)    Bee Venom Hives and Swelling   Lisinopril Cough   Shrimp (Diagnostic) Rash   Tomato Rash    Patient Measurements: Height: '5\' 7"'$  (170.2 cm) Weight: 135.1 kg (297 lb 13.5 oz) IBW/kg (Calculated) : 66.1 Heparin Dosing Weight: 98 kg  Vital Signs: BP: 149/131 (11/06 1700) Pulse Rate: 63 (11/06 1700)  Labs: Recent Labs    08/10/22 1702  HGB 13.9  HCT 41.9  PLT 218  LABPROT 18.7*  INR 1.6*    Estimated Creatinine Clearance: 86.4 mL/min (A) (by C-G formula based on SCr of 1.4 mg/dL (H)).   Medical History: Past Medical History:  Diagnosis Date   Anxiety    Aspirin allergy    Childhood asthma    Chronic systolic CHF (congestive heart failure) ([place])    a. EF 20-25% in 2012. b. EF 45-50% in 10/2011 with nonischemic nuc - presumed NICM. c. 12/2014 Echo: Sev depressed LV fxn, sev dil LV, mild LVH, mild MR, sev dil LA, mildly reduced RV fxn.   CKD (chronic kidney disease) stage 2, GFR 60-89 ml/min    H/O vasectomy 12/2019   High cholesterol    Hypertension    Morbid obesity ([place])    Nephrolithiasis    OSA on CPAP    Paroxysmal atrial fibrillation ([place])    Presumed NICM    a. 04/2014 Myoview: EF 26%, glob HK, sev glob HK, ? prior infarct;  b. Never cathed 2/2 CKD.   Renal cell carcinoma ([place])    a. s/p Rt robotic assisted partial converted to radical nephrectomy on 01/2013.   Troponin level elevated    a. 04/2014, 12/2014: felt due to CHF.   Type II diabetes mellitus (HCC)    Ventricular tachycardia (HCC)    a. appropriate ICD therapy 12/2017    Assessment: 46 yo male with a VAD, presents today to clinic w/ signs of  abdominal wall cellulitis and DL infection. + drainage from site. + low grade fever 99.8.  Patient is managed as an  outpatient on Warfarin therapy. Pharmacy consulted to start a heparin infusion when INR < 2.  INR 1.6  PTA Warfarin - 8 mg daily, except 4 mg on Tues and Sat.  Goal of Therapy:  Heparin level 0.3-0.5 units/ml Monitor platelets by anticoagulation protocol: Yes   Plan:  Start heparin infusion at 1200 units/hr Check anti-Xa level in 6 hours and daily while on heparin Continue to monitor H&H and platelets  ADDENDUM:  Found out patient got a Lovenox injection at 1 pm today.  Will wait to start heparin drip until 1 am.  Alanda Slim, PharmD, [facility] Clinical Pharmacist Please see AMION for all Pharmacists' Contact Phone Numbers 08/10/2022, 6:36 PM

## 2022-08-10 NOTE — Progress Notes (Signed)
Pharmacy Antibiotic Note  Jeffrey [name] is a 46 y.o. male admitted on 08/10/2022 with  VAD with drive-line infection .  Pharmacy has been consulted for Vanc and Cefepime dosing.  Vancomycin 1750 mg IV Q 24 hrs. Goal AUC 400-550. Expected AUC: 470 SCr used: 1.4   Plan: Start Cefepime 2 gms IV q8hr Start Vanc 2000 mg IV x 1, then 1750 mg IV q24hr Monitor renal function, C&S, and vanc levels as needed   No data recorded.  Recent Labs  Lab 08/05/22 1100  WBC 5.9  CREATININE 1.40*    Estimated Creatinine Clearance: 86.4 mL/min (A) (by C-G formula based on SCr of 1.4 mg/dL (H)).    Allergies  Allergen Reactions   Aspirin Shortness Of Breath, Itching and Rash     Burning sensation (Patient reports he tolerates other NSAIDS)    Bee Venom Hives and Swelling   Lisinopril Cough   Shrimp (Diagnostic) Rash   Tomato Rash    Antimicrobials this admission: Cefepime 11/6 >>  Vanc 11/6 >>   Thank you for allowing pharmacy to be a part of this patient's care.  Alanda Slim, PharmD, [facility] Clinical Pharmacist Please see AMION for all Pharmacists' Contact Phone Numbers 08/10/2022, 4:36 PM

## 2022-08-10 NOTE — Progress Notes (Signed)
  Subjective: Patient examined, images of recent CT of abdomen and chest March 2023 reviewed. Patient status post HeartMate 3 implantation 1 year ago October 2022.  In mid March 2023 the power cord was caught on a door handle and he had an episode of significant drainage from the wound which grew staph aureus MSSA and was treated with oral antibiotics.  CT of the abdomen showed some indurated tissue in the tunnel but no collections.  The patient was doing well and was seen in the clinic on November 1 without complaint.  He presented today with significant abdominal tenderness and increased drainage from the exit site of the power cord.  White count is 11.000 he has low-grade temperature.  The CT of the abdomen and chest is pending.  On the exam there is no easily entered tunnel track at the exit site.  There is indurated abdominal wall skin with erythema from the exit site to the midline.  The patient is now on IV vancomycin and Maxipime.  Blood cultures are pending.  Drainage fluid is cultured.  Objective: Vital signs in last 24 hours: Pulse Rate:  [63] 63 (11/06 1700) Resp:  [24] 24 (11/06 1700) BP: (149)/(131) 149/131 (11/06 1700) SpO2:  [98 %] 98 % (11/06 1700) Weight:  [135.1 kg] 135.1 kg (11/06 1800)  Hemodynamic parameters for last 24 hours:    Intake/Output from previous day: No intake/output data recorded. Intake/Output this shift: No intake/output data recorded.       Exam  Blood pressure (!) 135/99, pulse 62, resp. rate 18, height '5\' 7"'$  (1.702 m), weight 135.1 kg, SpO2 98 %.     General- alert and uncomfortable with abdominal tenderness    Neck- no JVD, no cervical adenopathy palpable, no carotid bruit   Lungs- clear without rales, wheezes   Cor- regular rate and rhythm, no murmur , gallop.  Normal VAD hum.   Abdom-power cord dressing intact, erythema of the lower mid and lower left abdominal wall with skin erythema.   Extremities - warm, non-tender, minimal edema, no  petechiae   Neuro- oriented, appropriate, no focal weakness   Lab Results: Recent Labs    08/10/22 1702  WBC 11.9*  HGB 13.9  HCT 41.9  PLT 218   BMET:  Recent Labs    08/10/22 1702  NA 138  K 4.0  CL 108  CO2 21*  GLUCOSE 101*  BUN 17  CREATININE 1.41*  CALCIUM 9.5    PT/INR:  Recent Labs    08/10/22 1702  LABPROT 18.7*  INR 1.6*   ABG    Component Value Date/Time   PHART 7.368 07/25/2021 0436   HCO3 28.9 (H) 07/25/2021 0436   TCO2 30 07/25/2021 0436   ACIDBASEDEF 1.0 07/24/2021 0449   O2SAT 73.7 08/05/2021 0515   CBG (last 3)  No results for input(s): "GLUCAP" in the last 72 hours.  Assessment/Plan: S/P  Admit for IV antibiotics, CT scan and possible surgical debridement.  Use IV heparin to maintain adequate anticoagulation while treatment plans are being made. Situation discussed with the patient who understands he could need to be hospitalized for several days.  LOS: 0 days    Dahlia Byes 08/10/2022

## 2022-08-10 NOTE — Progress Notes (Signed)
Pt placed on CPAP to rest. RT will continue to monitor as needed.

## 2022-08-10 NOTE — H&P (Addendum)
Advanced Heart Failure VAD History and Physical Note   PCP-Cardiologist: Glori Bickers, MD   Reason for Admission: Drive-line Infection and Abdominal Wall Cellulitis   HPI:    Jeffrey [name] is a 46 y.o. male with a history of  poorly controlled HTN, R renal cell carcinoma s/p nephrectomy, CKD IIIa, DM2, OSA, gout, morbid obesity and systolic HF due to NICM. S/p HM-3 VAD placement 07/21/21    Has [place]-[employer] S-ICD    HF dates back to 2012 with EF 25%    Admitted 7/22 with CHF/cardiogenic shock in setting AF with RVR. He was placed on amio gtt and underwent cardioversion. Echo EF < 20%, severe LV dilation, moderate RV dysfunction. Creatinine peaked at 4.65 improved to 1.58 which is his baseline. Underwent HM3 LVAD by Dr. Cyndia Bent on 07/21/21. Had post-op AF requiring DC-CV.   3/23 treated for DL infection. Wound Cx grew rare staph aureus. Initially treated w/ Doxy, later switched to Augmentin x 10 days + dose of IV ceftriaxone given in clinic 3/20. Infection resolved.   Presents today to clinic w/ signs of  abdominal wall cellulitis and DL infection. + drainage from site. + low grade fever 99.8. Reports chills.   BP elevated, MAPs 110s. In setting of pain and missed meds this morning.   LVAD INTERROGATION:  HeartMate II LVAD:  Flow 4.6 liters/min, speed 6200, power 5.2, PI 3.3.     Fixed speed 6200 Low speed limit: 5900  Review of Systems: [y] = yes, '[ ]'$  = no   General: Weight gain '[ ]'$ ; Weight loss '[ ]'$ ; Anorexia '[ ]'$ ; Fatigue '[ ]'$ ; Fever [ Y]; Chills [ Y]; Weakness '[ ]'$   Cardiac: Chest pain/pressure '[ ]'$ ; Resting SOB '[ ]'$ ; Exertional SOB '[ ]'$ ; Orthopnea '[ ]'$ ; Pedal Edema '[ ]'$ ; Palpitations '[ ]'$ ; Syncope '[ ]'$ ; Presyncope '[ ]'$ ; Paroxysmal nocturnal dyspnea'[ ]'$   Pulmonary: Cough '[ ]'$ ; Wheezing'[ ]'$ ; Hemoptysis'[ ]'$ ; Sputum '[ ]'$ ; Snoring '[ ]'$   GI: Vomiting'[ ]'$ ; Dysphagia'[ ]'$ ; Melena'[ ]'$ ; Hematochezia '[ ]'$ ; Heartburn'[ ]'$ ; Abdominal pain [ Y]; Constipation '[ ]'$ ; Diarrhea '[ ]'$ ; BRBPR '[ ]'$   GU: Hematuria'[ ]'$ ;  Dysuria '[ ]'$ ; Nocturia'[ ]'$   Vascular: Pain in legs with walking '[ ]'$ ; Pain in feet with lying flat '[ ]'$ ; Non-healing sores '[ ]'$ ; Stroke '[ ]'$ ; TIA '[ ]'$ ; Slurred speech '[ ]'$ ;  Neuro: Headaches'[ ]'$ ; Vertigo'[ ]'$ ; Seizures'[ ]'$ ; Paresthesias'[ ]'$ ;Blurred vision '[ ]'$ ; Diplopia '[ ]'$ ; Vision changes '[ ]'$   Ortho/Skin: Arthritis '[ ]'$ ; Joint pain '[ ]'$ ; Muscle pain '[ ]'$ ; Joint swelling '[ ]'$ ; Back Pain '[ ]'$ ; Rash '[ ]'$   Psych: Depression'[ ]'$ ; Anxiety'[ ]'$   Heme: Bleeding problems '[ ]'$ ; Clotting disorders '[ ]'$ ; Anemia '[ ]'$   Endocrine: Diabetes '[ ]'$ ; Thyroid dysfunction'[ ]'$     Home Medications Prior to Admission medications   Medication Sig Start Date End Date Taking? Authorizing Provider  Accu-Chek FastClix Lancets MISC USE AS DIRECTED FOUR TIMES DAILY 08/21/21 08/21/22  Vevelyn Francois, NP  acetaminophen (TYLENOL) 325 MG tablet Take 2 tablets (650 mg total) by mouth every 6 (six) hours as needed for fever. 05/22/21   Clegg, Amy D, NP  allopurinol (ZYLOPRIM) 100 MG tablet Take 1 tablet (100 mg total) by mouth daily. 08/05/22   Bensimhon, Shaune Pascal, MD  amiodarone (PACERONE) 200 MG tablet Take 1 tablet (200 mg total) by mouth daily. 08/05/22   Bensimhon, Shaune Pascal, MD  amLODipine (NORVASC) 10 MG tablet Take 1 tablet (10 mg total) by mouth daily  08/05/22 03/03/23  Bensimhon, Shaune Pascal, MD  colchicine 0.6 MG tablet Take 1 tablet (0.6 mg total) by mouth once daily. 08/05/22   Bensimhon, Shaune Pascal, MD  diclofenac Sodium (VOLTAREN) 1 % GEL Apply 2 g topically 4 (four) times daily. Patient taking differently: Apply 2 g topically 4 (four) times daily as needed (pain). 11/27/21   Raulkar, Clide Deutscher, MD  docusate sodium (COLACE) 100 MG capsule Take 1 capsule (100 mg total) by mouth 2 (two) times daily. Patient not taking: Reported on 08/05/2022 05/22/21   Darrick Grinder D, NP  doxazosin (CARDURA) 1 MG tablet Take 1 tablet (1 mg total) by mouth daily. 02/09/22   Bensimhon, Shaune Pascal, MD  enoxaparin (LOVENOX) 60 MG/0.6ML injection Inject 0.6 mLs (60 mg total) into the  skin every 12 (twelve) hours. 08/05/22   Bensimhon, Shaune Pascal, MD  gabapentin (NEURONTIN) 300 MG capsule Take 1 capsule (300 mg total) by mouth 3 (three) times daily. 08/05/22   Bensimhon, Shaune Pascal, MD  glucose blood test strip Use as directed 4 times daily. 08/25/21   Passmore, Jake Church I, NP  insulin aspart protamine - aspart (NOVOLOG MIX 70/30 FLEXPEN) (70-30) 100 UNIT/ML FlexPen Inject 38 Units into the skin 2 (two) times daily. Patient taking differently: Inject 34 Units into the skin 2 (two) times daily. 05/20/22 02/12/23  Fenton Foy, NP  Insulin Pen Needle (TRUEPLUS 5-BEVEL PEN NEEDLES) 29G X 12.7MM MISC Use as directed in the morning, at noon, and at bedtime. 08/14/21   Bo Merino I, NP  Insulin Syringe-Needle U-100 (TRUEPLUS INSULIN SYRINGE) 31G X 5/16" 0.5 ML MISC USE TO INJECT INSULIN TWICE DAILY. 08/14/21 08/14/22  Passmore, Jake Church I, NP  isosorbide-hydrALAZINE (BIDIL) 20-37.5 MG tablet Take 2 tablets by mouth 3 (three) times daily. 08/05/22   Bensimhon, Shaune Pascal, MD  meclizine (ANTIVERT) 25 MG tablet Take 1 tablet (25 mg total) by mouth 3 (three) times daily as needed for dizziness. 12/22/21   Bensimhon, Shaune Pascal, MD  metFORMIN (GLUCOPHAGE) 1000 MG tablet Take 1 tablet (1,000 mg total) by mouth 2 (two) times daily with a meal. 08/05/22   Bensimhon, Shaune Pascal, MD  pantoprazole (PROTONIX) 40 MG tablet Take 1 tablet (40 mg total) by mouth once daily. 08/05/22   Bensimhon, Shaune Pascal, MD  sacubitril-valsartan (ENTRESTO) 97-103 MG Take 1 tablet by mouth 2 (two) times daily. 08/05/22   Bensimhon, Shaune Pascal, MD  Semaglutide, 2 MG/DOSE, (OZEMPIC, 2 MG/DOSE,) 8 MG/3ML SOPN Inject 2 mg into the skin once a week. 05/18/22   Bensimhon, Shaune Pascal, MD  sertraline (ZOLOFT) 100 MG tablet Take 1.5 tablets (150 mg total) by mouth daily. 08/05/22   Bensimhon, Shaune Pascal, MD  spironolactone (ALDACTONE) 25 MG tablet Take 1 tablet (25 mg total) by mouth daily. 08/05/22 03/03/23  Bensimhon, Shaune Pascal, MD  torsemide  (DEMADEX) 20 MG tablet Take 1 tablet (20 mg total) by mouth daily as needed (If wt gain > 3 lb in 24 hr or > 5 lb in 1 week). Patient not taking: Reported on 08/05/2022 05/20/22   Fenton Foy, NP  traMADol (ULTRAM) 50 MG tablet Take 1-2 tablets (50-100 mg total) by mouth 2 (two) times daily. Patient not taking: Reported on 08/05/2022 12/22/21   Bensimhon, Shaune Pascal, MD  traZODone (DESYREL) 100 MG tablet Take 1 tablet (100 mg total) by mouth at bedtime. 08/05/22   Bensimhon, Shaune Pascal, MD  warfarin (COUMADIN) 4 MG tablet Take 1 tablet (4 mg) every Tuesday and 2 tablets (8  mg) all other days or as directed by HF clinic 08/05/22   Bensimhon, Shaune Pascal, MD    Past Medical History: Past Medical History:  Diagnosis Date   Anxiety    Aspirin allergy    Childhood asthma    Chronic systolic CHF (congestive heart failure) ([place])    a. EF 20-25% in 2012. b. EF 45-50% in 10/2011 with nonischemic nuc - presumed NICM. c. 12/2014 Echo: Sev depressed LV fxn, sev dil LV, mild LVH, mild MR, sev dil LA, mildly reduced RV fxn.   CKD (chronic kidney disease) stage 2, GFR 60-89 ml/min    H/O vasectomy 12/2019   High cholesterol    Hypertension    Morbid obesity ([place])    Nephrolithiasis    OSA on CPAP    Paroxysmal atrial fibrillation ([place])    Presumed NICM    a. 04/2014 Myoview: EF 26%, glob HK, sev glob HK, ? prior infarct;  b. Never cathed 2/2 CKD.   Renal cell carcinoma ([place])    a. s/p Rt robotic assisted partial converted to radical nephrectomy on 01/2013.   Troponin level elevated    a. 04/2014, 12/2014: felt due to CHF.   Type II diabetes mellitus ([place])    Ventricular tachycardia ([place])    a. appropriate ICD therapy 12/2017    Past Surgical History: Past Surgical History:  Procedure Laterality Date   APPENDECTOMY  07/2004   BIOPSY  02/10/2021   Procedure: BIOPSY;  Surgeon: Gatha Mayer, MD;  Location: [facility];  Service: Endoscopy;;   CARDIAC CATHETERIZATION N/A 05/17/2015   Procedure: Right/Left  Heart Cath and Coronary Angiography;  Surgeon: Jolaine Artist, MD;  Location: [place] [facility];  Service: Cardiovascular;  Laterality: N/A;   CARDIOVERSION N/A 04/30/2021   Procedure: CARDIOVERSION;  Surgeon: Larey Dresser, MD;  Location: [facility] ENDOSCOPY;  Service: Cardiovascular;  Laterality: N/A;   CARDIOVERSION N/A 07/27/2021   Procedure: CARDIOVERSION;  Surgeon: Jolaine Artist, MD;  Location: [place];  Service: Cardiovascular;  Laterality: N/A;   COLONOSCOPY WITH PROPOFOL N/A 02/10/2021   Procedure: COLONOSCOPY WITH PROPOFOL;  Surgeon: Gatha Mayer, MD;  Location: [facility];  Service: Endoscopy;  Laterality: N/A;   EP IMPLANTABLE DEVICE N/A 05/17/2015   Procedure: SubQ ICD Implant;  Surgeon: Will Meredith Leeds, MD;  Location: [place] [facility];  Service: Cardiovascular;  Laterality: N/A;   ESOPHAGOGASTRODUODENOSCOPY (EGD) WITH PROPOFOL N/A 02/10/2021   Procedure: ESOPHAGOGASTRODUODENOSCOPY (EGD) WITH PROPOFOL;  Surgeon: Gatha Mayer, MD;  Location: [facility];  Service: Endoscopy;  Laterality: N/A;   INSERTION OF IMPLANTABLE LEFT VENTRICULAR ASSIST DEVICE N/A 07/21/2021   Procedure: INSERTION OF IMPLANTABLE LEFT VENTRICULAR ASSIST DEVICE;  Surgeon: Gaye Pollack, MD;  Location: [place];  Service: Open Heart Surgery;  Laterality: N/A;   RIGHT HEART CATH N/A 05/13/2021   Procedure: RIGHT HEART CATH;  Surgeon: Jolaine Artist, MD;  Location: [place] [facility];  Service: Cardiovascular;  Laterality: N/A;   RIGHT HEART CATH N/A 07/18/2021   Procedure: RIGHT HEART CATH;  Surgeon: Jolaine Artist, MD;  Location: [place] [facility];  Service: Cardiovascular;  Laterality: N/A;   ROBOTIC ASSISTED LAPAROSCOPIC LYSIS OF ADHESION  01/13/2013   Procedure: ROBOTIC ASSISTED LAPAROSCOPIC LYSIS OF ADHESION EXTENSIVE;  Surgeon: Alexis Frock, MD;  Location: [facility];  Service: Urology;;   ROBOTIC ASSITED PARTIAL NEPHRECTOMY Right 01/13/2013   Procedure: ROBOTIC ASSITED PARTIAL NEPHRECTOMY  CONVERTED TO ROBOTIC ASSISTED RIGHT RADICAL NEPHRECTOMY;  Surgeon: Alexis Frock, MD;  Location: [facility];  Service: Urology;  Laterality: Right;   SUBQ ICD CHANGEOUT N/A 05/16/2020   Procedure: SUBQ ICD CHANGEOUT;  Surgeon: [name], MD;  Location: [place] [facility];  Service: Cardiovascular;  Laterality: N/A;   SUBQ ICD CHANGEOUT N/A 05/15/2020   Procedure: SUBQ ICD CHANGEOUT;  Surgeon: [name], MD;  Location: [place] [facility];  Service: Cardiovascular;  Laterality: N/A;   TEE WITHOUT CARDIOVERSION N/A 04/30/2021   Procedure: TRANSESOPHAGEAL ECHOCARDIOGRAM (TEE);  Surgeon: Larey Dresser, MD;  Location: [facility] ENDOSCOPY;  Service: Cardiovascular;  Laterality: N/A;   TEE WITHOUT CARDIOVERSION N/A 07/21/2021   Procedure: TRANSESOPHAGEAL ECHOCARDIOGRAM (TEE);  Surgeon: Gaye Pollack, MD;  Location: [place];  Service: Open Heart Surgery;  Laterality: N/A;   VASECTOMY      Family History: Family History  Problem Relation Age of Onset   Hypertension Mother    Diabetes Maternal Aunt    Heart attack Neg Hx    Stroke Neg Hx    Colon cancer Neg Hx    Pancreatic cancer Neg Hx    Stomach cancer Neg Hx    Liver cancer Neg Hx    Esophageal cancer Neg Hx     Social History: Social History   Socioeconomic History   Marital status: Married    Spouse name: Taz Vanness   Number of children: 99   Years of education: Not on file   Highest education level: Not on file  Occupational History   Not on file  Tobacco Use   Smoking status: Former    Packs/day: 0.25    Years: 22.00    Total pack years: 5.50    Types: Cigarettes    Quit date: 04/30/2015    Years since quitting: 7.2   Smokeless tobacco: Never  Vaping Use   Vaping Use: Never used  Substance and Sexual Activity   Alcohol use: Not Currently    Comment: none   Drug use: Yes    Types: Marijuana   Sexual activity: Not Currently    Birth control/protection: Condom  Other Topics Concern   Not on file  Social History  Narrative   Married - 43 kids 8 boys 7 girls many are adults, 2 young children with current wife   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   Social Determinants of Health   Financial Resource Strain: High Risk (12/12/2021)   Overall Financial Resource Strain (CARDIA)    Difficulty of Paying Living Expenses: Hard  Food Insecurity: Food Insecurity Present (01/12/2022)   Hunger Vital Sign    Worried About Running Out of Food in the Last Year: Sometimes true    Ran Out of Food in the Last Year: Sometimes true  Transportation Needs: Unmet Transportation Needs (02/13/2022)   PRAPARE - Hydrologist (Medical): Yes    Lack of Transportation (Non-Medical): Yes  Physical Activity: Sufficiently Active (07/30/2022)   Exercise Vital Sign    Days of Exercise per Week: 3 days    Minutes of Exercise per Session: 60 min  Stress: No Stress Concern Present (05/18/2022)   [place]  Feeling of Stress : Only a little  Social Connections: Moderately Integrated (05/18/2022)   Social Connection and Isolation Panel [NHANES]    Frequency of Communication with Friends and Family: More than three times a week    Frequency of Social Gatherings with Friends and Family: More than three times a week    Attends Religious Services: 1 to 4 times per year    Active Member of [employer] or Organizations: No    Attends Archivist Meetings: Never    Marital Status: Married    Allergies:  Allergies  Allergen Reactions   Aspirin Shortness Of Breath, Itching and Rash     Burning sensation (Patient reports he tolerates other  NSAIDS)    Bee Venom Hives and Swelling   Lisinopril Cough   Shrimp (Diagnostic) Rash   Tomato Rash    Objective:    Vital Signs:       There were no vitals filed for this visit.  Mean arterial Pressure 110s  Physical Exam    General:  Well appearing, mod obese. No resp difficulty HEENT: Normal Neck: supple. JVP . Carotids 2+ bilat; no bruits. No lymphadenopathy or thyromegaly appreciated. Cor: Mechanical heart sounds with LVAD hum present. Lungs: Clear Abdomen: soft, nontender, nondistended. No hepatosplenomegaly. No bruits or masses. Good bowel sounds. Driveline: C/D/I; securement device intact and driveline incorporated + redness surrounding DL + drainage  + abdominal wall tenderness  Extremities: no cyanosis, clubbing, rash, edema Neuro: alert & orientedx3, cranial nerves grossly intact. moves all 4 extremities w/o difficulty. Affect pleasant   Telemetry   N/A   EKG   12 lead pending   Labs    Basic Metabolic Panel: Recent Labs  Lab 08/05/22 1100  NA 141  K 3.8  CL 114*  CO2 21*  GLUCOSE 111*  BUN 23*  CREATININE 1.40*  CALCIUM 9.7    Liver Function Tests: Recent Labs  Lab 08/05/22 1100  AST 22  ALT 23  ALKPHOS 66  BILITOT 0.6  PROT 6.9  ALBUMIN 3.7   No results for input(s): "LIPASE", "AMYLASE" in the last 168 hours. No results for input(s): "AMMONIA" in the last 168 hours.  CBC: Recent Labs  Lab 08/05/22 1100  WBC 5.9  HGB 14.3  HCT 42.6  MCV 88.9  PLT 222    Cardiac Enzymes: No results for input(s): "CKTOTAL", "CKMB", "CKMBINDEX", "TROPONINI" in the last 168 hours.  BNP: BNP (last 3 results) Recent Labs    05/19/22 1457  BNP 47.0    ProBNP (last 3 results) No results for input(s): "PROBNP" in the last 8760 hours.   CBG: No results for input(s): "GLUCAP" in the last 168 hours.  Coagulation Studies: No results for input(s): "LABPROT", "INR" in the last 72 hours.  Other results: EKG: pending.   Imaging    No  results found.    Patient Profile:    46 y.o. male with a history of  poorly controlled HTN, R renal cell carcinoma s/p nephrectomy, CKD IIIa, DM2, OSA, gout, morbid obesity and systolic HF due to NICM. 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   Assessment/Plan:    1. DL infection/ Abdominal Wall Cellulitis  - prior DL infection 3/10, wound cx + rare staph aureus, resolved w/ outpatient abx  - now w/ recurrent infection  - obtain wound + blood cx  - admit for IV abx, start vanc + cefepime. ID consult  - obtain CT of chest/ abdom/  pelvis  - d/w Dr. Prescott Gum, will likely need debridement. Hold coumadin tonight   2.  Chronic systolic CHF  - Nonischemic CM, end-stage  - EF 2012; 20-25%  - Echo  8/22: EF < 20%, severe LV dilation, moderate RV dysfunction.   - RHC (8/22): well compensated hemodynamics.  - s/p S-ICD  - S/p HM3 LVAD 10/17 (DT)  - Doing great NYHA I. Volume status looks good.  - MAPs elevated in setting of pain and missed AM meds  - Continue Bidil 2 tabs tid  - Continue spironolactone 25 mg daily  - Continue entresto to 97/130 bid  - Stressed need to continue weight loss efforts (see below). Continue Ozempic. Once BMI closer to 35 will refer to [facility] for begin transplant evaluation (Body mass index is 46.64 kg/m.)    3. VAD  - s/p HM-3 placement 07/21/21  - VAD interrogated personally. Parameters stable.  - Check INR, Hold Coumadin tonight for likely debridgement of DL. Start IV heparin once INR < 2.0  - check LDH     4 Atrial fibrillation: Paroxysmal  - Had severe AF with RVR 7/22 s/p DC-CV x 2  - Seen by EP. Not a candidate for ablation due to body habitus  (could consider AVN ablation and CRT, if needed)  - Had post-op AF with RVR post VAD  - 10/23 S/P DC-CV with restoration of NSR.  - Remains in NSR Continue amio 100 daily  - Hold coumadin per above, start heparin gtt once INR < 2.0    5. HTN  - Blood  pressure in setting of pain. Missed meds this morning - Resume home regimen.  - pain control   6. CKD stage 3B  - Has solitary kidney.   - Baseline creatinine is 1.5-1.8.  - Check CMP today    7. OSA  - Continue CPAP    8. DM2  - On Jardiance.  - Hgb A1c 9.4. -> 5.9  - PCP following    9. Polyarticular gout  - quiescent    10. Depression/anxiety  - Continue Zoloft '150mg'$  daily    11. Morbid obesity  - Body mass index is 46.64 kg/m.  - Weight coming down  - Continue GLP1RA    I reviewed the LVAD parameters from today, and compared the results to the patient's prior recorded data.  No programming changes were made.  The LVAD is functioning within specified parameters.  The patient performs LVAD self-test daily.  LVAD interrogation was negative for any significant power changes, alarms or PI events/speed drops.  LVAD equipment check completed and is in good working order.  Back-up equipment present.   LVAD education done on emergency procedures and precautions and reviewed exit site care.  Length of Stay: 0  Nelida Gores 08/10/2022, 4:13 PM  VAD Team Pager [phone] (7am - 7am) +++VAD ISSUES ONLY+++   Advanced Heart Failure Team Pager [phone] (M-F; [place])  Please contact [facility] for night-coverage after hours (5p -7a ) and weekends on [email or web address] for all non- LVAD Issues   Patient seen with PA, agree with the above note.   Patient presented to clinic today with abdominal wall redness and tenderness as well as drainage from his driveline site.  Had prior S aureus DL infection in 3/23, not currently on antibiotics.   MAP elevated 110s but has not taken BP-active meds today.   General: Well appearing this am. NAD.  HEENT: Normal. Neck: Supple, JVP 7-8 cm. Carotids  OK.  Cardiac:  Mechanical heart sounds with LVAD hum present.  Lungs:  CTAB, normal effort.  Abdomen:  Erythema in abdominal wall medial to driveline, tenderness at site of erythema.   LVAD exit site: Well-healed and incorporated. Dressing dry and intact. No erythema or drainage. Stabilization device present and accurately applied. Driveline dressing changed daily per sterile technique. Extremities:  Warm and dry. No cyanosis, clubbing, rash, or edema.  Neuro:  Alert & oriented x 3. Cranial nerves grossly intact. Moves all 4 extremities w/o difficulty. Affect pleasant    Suspect driveline infection with cellulitis.  May need debridement of site.  - Admit - Start vancomycin/cefepime.  - Will order abdominal CT for abscess.  - Hold warfarin, heparin gtt when INR < 2.  - Dr. Prescott Gum to see.   MAP elevated, did not take meds today and in pain.  Make sure he gets his home anti-hypertensives today.   Volume status looks ok on exam.   Loralie Champagne 08/10/2022 5:06 PM

## 2022-08-11 ENCOUNTER — Inpatient Hospital Stay (HOSPITAL_COMMUNITY): Payer: Medicaid Other

## 2022-08-11 DIAGNOSIS — K760 Fatty (change of) liver, not elsewhere classified: Secondary | ICD-10-CM | POA: Diagnosis not present

## 2022-08-11 DIAGNOSIS — T827XXA Infection and inflammatory reaction due to other cardiac and vascular devices, implants and grafts, initial encounter: Secondary | ICD-10-CM | POA: Diagnosis not present

## 2022-08-11 DIAGNOSIS — J9811 Atelectasis: Secondary | ICD-10-CM | POA: Diagnosis not present

## 2022-08-11 DIAGNOSIS — R188 Other ascites: Secondary | ICD-10-CM | POA: Diagnosis not present

## 2022-08-11 DIAGNOSIS — A419 Sepsis, unspecified organism: Secondary | ICD-10-CM | POA: Diagnosis not present

## 2022-08-11 DIAGNOSIS — I517 Cardiomegaly: Secondary | ICD-10-CM | POA: Diagnosis not present

## 2022-08-11 LAB — BASIC METABOLIC PANEL
Anion gap: 9 (ref 5–15)
BUN: 17 mg/dL (ref 6–20)
CO2: 21 mmol/L — ABNORMAL LOW (ref 22–32)
Calcium: 9 mg/dL (ref 8.9–10.3)
Chloride: 108 mmol/L (ref 98–111)
Creatinine, Ser: 1.3 mg/dL — ABNORMAL HIGH (ref 0.61–1.24)
GFR, Estimated: >60 mL/min (ref >60)
Glucose, Bld: 110 mg/dL — ABNORMAL HIGH (ref 70–99)
Potassium: 3.6 mmol/L (ref 3.5–5.1)
Sodium: 138 mmol/L (ref 135–145)

## 2022-08-11 LAB — URINALYSIS, ROUTINE W REFLEX MICROSCOPIC
Bacteria, UA: NONE SEEN
Bilirubin Urine: NEGATIVE
Glucose, UA: NEGATIVE mg/dL
Ketones, ur: NEGATIVE mg/dL
Leukocytes,Ua: NEGATIVE
Nitrite: NEGATIVE
Protein, ur: 100 mg/dL — AB
Specific Gravity, Urine: 1.018 (ref 1.005–1.030)
pH: 5 (ref 5.0–8.0)

## 2022-08-11 LAB — CBC
HCT: 39 % (ref 39.0–52.0)
Hemoglobin: 13.6 g/dL (ref 13.0–17.0)
MCH: 30.7 pg (ref 26.0–34.0)
MCHC: 34.9 g/dL (ref 30.0–36.0)
MCV: 88 fL (ref 80.0–100.0)
Platelets: 186 10*3/uL (ref 150–400)
RBC: 4.43 MIL/uL (ref 4.22–5.81)
RDW: 14.6 % (ref 11.5–15.5)
WBC: 11.5 10*3/uL — ABNORMAL HIGH (ref 4.0–10.5)
nRBC: 0 % (ref 0.0–0.2)

## 2022-08-11 LAB — LACTATE DEHYDROGENASE: LDH: 130 U/L (ref 98–192)

## 2022-08-11 LAB — PROTIME-INR
INR: 1.4 — ABNORMAL HIGH (ref 0.8–1.2)
Prothrombin Time: 16.9 seconds — ABNORMAL HIGH (ref 11.4–15.2)

## 2022-08-11 LAB — GLUCOSE, CAPILLARY
Glucose-Capillary: 103 mg/dL — ABNORMAL HIGH (ref 70–99)
Glucose-Capillary: 115 mg/dL — ABNORMAL HIGH (ref 70–99)
Glucose-Capillary: 129 mg/dL — ABNORMAL HIGH (ref 70–99)

## 2022-08-11 LAB — TYPE AND SCREEN
ABO/RH(D): O POS
Antibody Screen: NEGATIVE

## 2022-08-11 LAB — HEPARIN LEVEL (UNFRACTIONATED)
Heparin Unfractionated: 0.19 IU/mL — ABNORMAL LOW (ref 0.30–0.70)
Heparin Unfractionated: <0.1 IU/mL — ABNORMAL LOW (ref 0.30–0.70)

## 2022-08-11 LAB — HIV ANTIBODY (ROUTINE TESTING W REFLEX): HIV Screen 4th Generation wRfx: NONREACTIVE

## 2022-08-11 LAB — MAGNESIUM: Magnesium: 1.4 mg/dL — ABNORMAL LOW (ref 1.7–2.4)

## 2022-08-11 MED ORDER — CHLORHEXIDINE GLUCONATE CLOTH 2 % EX PADS
6.0000 | MEDICATED_PAD | Freq: Every day | CUTANEOUS | Status: DC
  Administered 2022-08-11 – 2022-08-26 (×14): 6 via TOPICAL

## 2022-08-11 MED ORDER — ORAL CARE MOUTH RINSE: 15.0000 mL | OROMUCOSAL | Status: DC | PRN

## 2022-08-11 MED ORDER — HYDROMORPHONE HCL 1 MG/ML IJ SOLN
0.5000 mg | INTRAMUSCULAR | Status: DC | PRN
  Administered 2022-08-11 (×2): 0.5 mg via INTRAVENOUS
  Filled 2022-08-11 (×2): qty 0.5

## 2022-08-11 MED ORDER — TRAMADOL HCL 50 MG PO TABS
50.0000 mg | ORAL_TABLET | Freq: Four times a day (QID) | ORAL | Status: DC | PRN
  Administered 2022-08-11 (×2): 100 mg via ORAL
  Administered 2022-08-11: 50 mg via ORAL
  Administered 2022-08-12 – 2022-08-26 (×14): 100 mg via ORAL
  Filled 2022-08-11 (×5): qty 2
  Filled 2022-08-11: qty 1
  Filled 2022-08-11 (×12): qty 2

## 2022-08-11 MED ORDER — MAGNESIUM SULFATE 4 GM/100ML IV SOLN
4.0000 g | Freq: Once | INTRAVENOUS | Status: AC
  Administered 2022-08-11: 4 g via INTRAVENOUS
  Filled 2022-08-11: qty 100

## 2022-08-11 MED ORDER — JUVEN PO PACK
1.0000 | PACK | Freq: Two times a day (BID) | ORAL | Status: DC
  Administered 2022-08-11 – 2022-08-26 (×11): 1 via ORAL
  Filled 2022-08-11 (×18): qty 1

## 2022-08-11 MED ORDER — MORPHINE SULFATE (PF) 4 MG/ML IV SOLN
4.0000 mg | INTRAVENOUS | Status: DC | PRN
  Administered 2022-08-11 – 2022-08-25 (×54): 4 mg via INTRAVENOUS
  Filled 2022-08-11 (×56): qty 1

## 2022-08-11 MED ORDER — HYDROMORPHONE HCL 1 MG/ML IJ SOLN
0.5000 mg | INTRAMUSCULAR | Status: DC | PRN
  Administered 2022-08-11: 1 mg via INTRAVENOUS
  Filled 2022-08-11: qty 1

## 2022-08-11 MED ORDER — POTASSIUM CHLORIDE CRYS ER 20 MEQ PO TBCR
40.0000 meq | EXTENDED_RELEASE_TABLET | Freq: Once | ORAL | Status: AC
  Administered 2022-08-11: 40 meq via ORAL
  Filled 2022-08-11: qty 2

## 2022-08-11 MED ORDER — CEFAZOLIN SODIUM-DEXTROSE 2-4 GM/100ML-% IV SOLN
2.0000 g | Freq: Three times a day (TID) | INTRAVENOUS | Status: DC
  Administered 2022-08-11 – 2022-08-26 (×45): 2 g via INTRAVENOUS
  Filled 2022-08-11 (×44): qty 100

## 2022-08-11 MED ORDER — CEFAZOLIN IN SODIUM CHLORIDE 3-0.9 GM/100ML-% IV SOLN
3.0000 g | INTRAVENOUS | Status: AC
  Administered 2022-08-12: 3 g via INTRAVENOUS
  Filled 2022-08-11: qty 100

## 2022-08-11 NOTE — Progress Notes (Signed)
Exit Site Care: Existing VAD dressing removed and site care performed using sterile technique. Drive line exit site cleaned with Chlora prep applicators x 2, allowed to dry. Large amount of thick purulent drainage. Exit site unincorporated, the velour is fully implanted at exit site. Site had moderate redness, tenderness and drainage. No foul odor or rash noted. Drive line anchor re-applied.   Warden Fillers RN-Nurse Davonna Belling

## 2022-08-11 NOTE — Progress Notes (Signed)
2130 page to cardiology regarding pain assessment. Pt. hypertensive and complaining of 10/10 abdominal pain. '50mg'$  tramadol (ultram) ordered and given with PRN '650mg'$  Tylenol.  0030 page to cardiology regarding pain assessment and temperature. Pt. temperature 102.3 and pain 10/10 despite PRNs. Hydromorphone 0.'5mg'$  given. Nursing interventions carried out to reduce body temperature: decrease temp in room, ice packs, fan.

## 2022-08-11 NOTE — Progress Notes (Signed)
Initial Nutrition Assessment  DOCUMENTATION CODES:   Obesity unspecified  INTERVENTION:   Juven BID, each packet provides 80 calories, 8 grams of carbohydrate, 2.5  grams of protein (collagen), 7 grams of L-arginine and 7 grams of L-glutamine; supplement contains CaHMB, Vitamins C, E, B12 and Zinc to promote wound healing  Allow double protein portions at meal times.   If po intake not adequate on follow-up, recommend liberalizing diet  NUTRITION DIAGNOSIS:   Increased nutrient needs related to wound healing, acute illness as evidenced by estimated needs.  GOAL:   Patient will meet greater than or equal to 90% of their needs  MONITOR:   PO intake, Supplement acceptance, Labs, Weight trends, Skin  REASON FOR ASSESSMENT:   Rounds    ASSESSMENT:   46 yo male admitted with abdominal wall cellulitis and drive line infection. PMH includes LVAD HM III (07/21/2021), CKD stage 2, HTN, renal cell carcinoma with nephrectomy in 2014, CHF  11/06 Admitted  Pt complaining of pain in abdomen, site of cellulitis and at drive line insertion site  Reviewed CT surgery notes .VAD tunnel infection with sig drainage and dead space around cord with plan plan for I&D of tunnel in OR tomorrow.   Pt with good appetite, eating well. Recorded po intake 75-100% of meals. Pt reports he has been eating well at home also.   Despite good appetite, pt with increased nutrient needs related to the infection and wounds with plans for surgery. RD discussed trial of Juven BID which is an oral nutrition supplement specific to wound healing; pt is agreeable to try at this time.   Current wt 135.5 kg.  Labs: reviewed; CBGs 103-144 Meds: novolog 70/30 BID with meals, Glucophage    Diet Order:   Diet Order             Diet NPO time specified  Diet effective midnight           Diet heart healthy/carb modified Room service appropriate? Yes; Fluid consistency: Thin  Diet effective now                    EDUCATION NEEDS:   Education needs have been addressed  Skin:  Skin Assessment: Skin Integrity Issues: Skin Integrity Issues:: Other (Comment) Other: Driveline infection: purulent drainage; cellulitis to abdomen  Last BM:  PTA  Height:   Ht Readings from Last 1 Encounters:  08/10/22 '5\' 7"'$  (1.702 m)    Weight:   Wt Readings from Last 1 Encounters:  08/11/22 135.5 kg    BMI:  Body mass index is 46.79 kg/m.  Estimated Nutritional Needs:   Kcal:  2000-2200 kcals  Protein:  115-130 g  Fluid:  1.8 L  Kerman Passey MS, RDN, LDN, CNSC Registered Dietitian 3 Clinical Nutrition RD Pager and On-Call Pager Number Located in [place]

## 2022-08-11 NOTE — TOC CM/SW Note (Signed)
..   Transition of Care [facility]) Screening Note   Patient Details  Name: Jeffrey [name] Date of Birth: [birth year]   Transition of Care [facility]) CM/SW Contact:    Erenest Rasher, RN Phone Number: 08/11/2022, 3:40 PM    Transition of Care Department [facility]) has reviewed patient. We will continue to monitor patient advancement through interdisciplinary progression rounds. Patient may need IV abx and wound vac at dc. [place], Heart Failure TOC CM [phone]

## 2022-08-11 NOTE — Progress Notes (Signed)
[place] for Heparin Indication:  VAD  Allergies  Allergen Reactions   Aspirin Shortness Of Breath, Itching and Rash     Burning sensation (Patient reports he tolerates other NSAIDS)    Bee Venom Hives and Swelling   Lisinopril Cough   Shrimp (Diagnostic) Rash   Tomato Rash    Patient Measurements: Height: '5\' 7"'$  (170.2 cm) Weight: 135.5 kg (298 lb 11.6 oz) IBW/kg (Calculated) : 66.1 Heparin Dosing Weight: 98 kg  Vital Signs: Temp: 98.2 F (36.8 C) (11/07 0840) Temp Source: Oral (11/07 0840) BP: 120/69 (11/07 0700) Pulse Rate: 77 (11/07 0700)  Labs: Recent Labs    08/10/22 1702 08/11/22 0656  HGB 13.9 13.6  HCT 41.9 39.0  PLT 218 186  LABPROT 18.7* 16.9*  INR 1.6* 1.4*  HEPARINUNFRC  --  0.19*  CREATININE 1.41* 1.30*     Estimated Creatinine Clearance: 93.3 mL/min (A) (by C-G formula based on SCr of 1.3 mg/dL (H)).   Medical History: Past Medical History:  Diagnosis Date   Anxiety    Aspirin allergy    Childhood asthma    Chronic systolic CHF (congestive heart failure) ([place])    a. EF 20-25% in 2012. b. EF 45-50% in 10/2011 with nonischemic nuc - presumed NICM. c. 12/2014 Echo: Sev depressed LV fxn, sev dil LV, mild LVH, mild MR, sev dil LA, mildly reduced RV fxn.   CKD (chronic kidney disease) stage 2, GFR 60-89 ml/min    H/O vasectomy 12/2019   High cholesterol    Hypertension    Morbid obesity ([place])    Nephrolithiasis    OSA on CPAP    Paroxysmal atrial fibrillation ([place])    Presumed NICM    a. 04/2014 Myoview: EF 26%, glob HK, sev glob HK, ? prior infarct;  b. Never cathed 2/2 CKD.   Renal cell carcinoma ([place])    a. s/p Rt robotic assisted partial converted to radical nephrectomy on 01/2013.   Troponin level elevated    a. 04/2014, 12/2014: felt due to CHF.   Type II diabetes mellitus (HCC)    Ventricular tachycardia (HCC)    a. appropriate ICD therapy 12/2017    Assessment: 46 yo male with a VAD, presents  today to clinic w/ signs of  abdominal wall cellulitis and DL infection. + drainage from site. + low grade fever 99.8.  Patient is managed as an outpatient on Warfarin therapy. Pharmacy consulted to start a heparin infusion when INR < 2.  INR 1.4.  Coumadin on hold while awaiting possible OR.  PTA Warfarin - 8 mg daily, except 4 mg on Tues and Sat.  Goal of Therapy:  Heparin level 0.3-0.5 units/ml Monitor platelets by anticoagulation protocol: Yes   Plan:  Per discussion with Dr. [name], will lower heparin rate to 700 units/hr since VAD patient. Check heparin level in 8 hrs. Daily heparin level and CBC.  [place] Crane, Roylene Reason, [facility] Clinical Pharmacist  08/11/2022 9:42 AM   [facility] pharmacy phone numbers are listed on [email or web address]

## 2022-08-11 NOTE — Progress Notes (Signed)
  Present Illness Patient examined and VAD wound examined and sterile dressing changed.Large amount thick brown drainage in dressing. Wound probed with sterile Qtip 10 cm proximally in abd wall. Mid line abdominal wall remains tender with erythema- cellulitis. I reviewed the CT scan which shows tissue thickening- fluid along the power cord distal aspect. Patient blood cultures negative- prelim wound cultures show staph Objective: Vital signs in last 24 hours: Temp:  [97.6 F (36.4 C)-102.3 F (39.1 C)] 98.2 F (36.8 C) (11/07 0840) Pulse Rate:  [55-99] 77 (11/07 0700) Cardiac Rhythm: Sinus tachycardia (11/07 0400) Resp:  [16-42] 27 (11/07 0700) BP: (108-158)/(69-131) 120/69 (11/07 0700) SpO2:  [93 %-100 %] 93 % (11/07 0700) Weight:  [135.1 kg-135.5 kg] 135.5 kg (11/07 0500)  Hemodynamic parameters for last 24 hours:  Nsr afebrile  Intake/Output from previous day: 11/06 0701 - 11/07 0700 In: 1098 [P.O.:240; I.V.:57.9; IV Piggyback:800.1] Out: 600 [Urine:600] Intake/Output this shift: No intake/output data recorded.       Exam    General- alert and  c/o abd wall pain    Neck- no JVD, no cervical adenopathy palpable, no carotid bruit   Lungs- clear without rales, wheezes   Cor- regular rate and rhythm, no murmur , gallop. VAD hum present.   Abdomen- soft, with tenderness and erythema from  PC exit site to central abdomen   Extremities - warm, non-tender, minimal edema   Neuro- oriented, appropriate, no focal weakness   Lab Results: Recent Labs    08/10/22 1702 08/11/22 0656  WBC 11.9* 11.5*  HGB 13.9 13.6  HCT 41.9 39.0  PLT 218 186   BMET:  Recent Labs    08/10/22 1702 08/11/22 0656  NA 138 138  K 4.0 3.6  CL 108 108  CO2 21* 21*  GLUCOSE 101* 110*  BUN 17 17  CREATININE 1.41* 1.30*  CALCIUM 9.5 9.0    PT/INR:  Recent Labs    08/11/22 0656  LABPROT 16.9*  INR 1.4*   ABG    Component Value Date/Time   PHART 7.368 07/25/2021 0436   HCO3 28.9  (H) 07/25/2021 0436   TCO2 30 07/25/2021 0436   ACIDBASEDEF 1.0 07/24/2021 0449   O2SAT 73.7 08/05/2021 0515   CBG (last 3)  Recent Labs    08/10/22 2206 08/11/22 0845  GLUCAP 144* 103*    Assessment/Plan: S/P  VAD tunnel infection with sig drainage and dead space around the cord- needs I&D of tunnel  which will be be scheduled in OR tomorrow. Procedure d/w patient.  LOS: 1 day    Dahlia Byes 08/11/2022

## 2022-08-11 NOTE — Addendum Note (Signed)
Encounter addended by: Doren Custard, RN on: 08/11/2022 8:11 AM  Actions taken: Clinical Note Signed

## 2022-08-11 NOTE — Progress Notes (Addendum)
Advanced Heart Failure VAD Team Note  PCP-Cardiologist: Glori Bickers, MD   Subjective:    11/6: Admitted w/ DL infection. Wound and Blood Cx pending. CT completed and pending.   On Vanc + cefepime. Febrile overnight, mTemp 102. WBC 11.5K.   INR 1.4. On heparin gtt.   C/w abd pain. No further chills. Denies CP. No dyspnea.   LVAD INTERROGATION:  HeartMate III LVAD:   Flow 4.8 liters/min, speed 6200, power 5.1, PI 3.0. No PI events.    Objective:    Vital Signs:   Temp:  [97.6 F (36.4 C)-102.3 F (39.1 C)] 98.1 F (36.7 C) (11/07 0400) Pulse Rate:  [55-99] 77 (11/07 0700) Resp:  [16-42] 27 (11/07 0700) BP: (108-158)/(93-131) 136/108 (11/07 0600) SpO2:  [93 %-100 %] 93 % (11/07 0700) Weight:  [135.1 kg-135.5 kg] 135.5 kg (11/07 0500) Last BM Date :  (PTA) Mean arterial Pressure 74  Intake/Output:   Intake/Output Summary (Last 24 hours) at 08/11/2022 1610 Last data filed at 08/11/2022 0700 Gross per 24 hour  Intake 1097.99 ml  Output 600 ml  Net 497.99 ml     Physical Exam    General:  Well appearing. No resp difficulty HEENT: normal Neck: supple. Thick neck, JVD not well visualized . Carotids 2+ bilat; no bruits. No lymphadenopathy or thyromegaly appreciated. Cor: Mechanical heart sounds with LVAD hum present. Lungs: clear Abdomen: soft, nontender, nondistended. No hepatosplenomegaly. No bruits or masses. Good bowel sounds. Driveline: C/D/I; securement device intact and driveline incorporated, + erythema in abd wall medial to midline, + tenderness  Extremities: no cyanosis, clubbing, rash, edema Neuro: alert & orientedx3, cranial nerves grossly intact. moves all 4 extremities w/o difficulty. Affect pleasant   Telemetry   NSR 70s, several runs of NSVT 5-7 beats   EKG    No new EKG to review   Labs   Basic Metabolic Panel: Recent Labs  Lab 08/05/22 1100 08/10/22 1702  NA 141 138  K 3.8 4.0  CL 114* 108  CO2 21* 21*  GLUCOSE 111* 101*  BUN  23* 17  CREATININE 1.40* 1.41*  CALCIUM 9.7 9.5    Liver Function Tests: Recent Labs  Lab 08/05/22 1100 08/10/22 1702  AST 22 19  ALT 23 16  ALKPHOS 66 41  BILITOT 0.6 1.1  PROT 6.9 6.9  ALBUMIN 3.7 3.4*   No results for input(s): "LIPASE", "AMYLASE" in the last 168 hours. No results for input(s): "AMMONIA" in the last 168 hours.  CBC: Recent Labs  Lab 08/05/22 1100 08/10/22 1702  WBC 5.9 11.9*  HGB 14.3 13.9  HCT 42.6 41.9  MCV 88.9 89.7  PLT 222 218    INR: Recent Labs  Lab 08/05/22 1100 08/10/22 1702  INR 1.3* 1.6*    Other results: EKG:    Imaging   No results found.   Medications:     Scheduled Medications:  allopurinol  100 mg Oral Daily   amiodarone  200 mg Oral Daily   amLODipine  10 mg Oral Daily   Chlorhexidine Gluconate Cloth  6 each Topical Daily   colchicine  0.6 mg Oral Daily   doxazosin  1 mg Oral Daily   gabapentin  300 mg Oral TID   insulin aspart protamine- aspart  34 Units Subcutaneous BID WC   isosorbide-hydrALAZINE  2 tablet Oral TID   metFORMIN  1,000 mg Oral BID WC   pantoprazole  40 mg Oral Daily   sacubitril-valsartan  1 tablet Oral BID   sertraline  150 mg Oral Daily   spironolactone  25 mg Oral Daily   traZODone  100 mg Oral QHS    Infusions:  ceFEPime (MAXIPIME) IV Stopped (08/11/22 0550)   heparin 1,200 Units/hr (08/11/22 0700)   vancomycin      PRN Medications: acetaminophen, acetaminophen, HYDROmorphone (DILAUDID) injection, naloxone, mouth rinse, oxyCODONE, traMADol   Patient Profile   46 y.o. male with a history of  poorly controlled HTN, R renal cell carcinoma s/p nephrectomy, CKD IIIa, DM2, OSA, gout, morbid obesity and systolic HF due to NICM. 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    Assessment/Plan:     1. DL infection/ Abdominal Wall Cellulitis  - prior DL infection 3/10, wound cx + rare staph aureus, resolved w/ outpatient abx  - now w/  recurrent infection  - wound + blood cx pending  - on vanc + cefepime. ID consult  - CT of chest/ abdom/ pelvis pending  - d/w Dr. Prescott Gum, may need debridement. Hold coumadin. Cont heparin  - increase pain regimen, d/w pharmD  2.  Chronic systolic CHF  - Nonischemic CM, end-stage  - EF 2012; 20-25%  - Echo  8/22: EF < 20%, severe LV dilation, moderate RV dysfunction.   - RHC (8/22): well compensated hemodynamics.  - s/p S-ICD  - S/p HM3 LVAD 10/17 (DT)  - Doing great NYHA I. Volume status looks good - MAPs better this morning  - Continue Bidil 2 tabs tid  - Continue spironolactone 25 mg daily  - Continue entresto to 97/130 bid  - Stressed need to continue weight loss efforts (see below). Continue Ozempic. Once BMI closer to 35 will refer to [facility] for begin transplant evaluation (Body mass index is 46.64 kg/m.)    3. VAD  - s/p HM-3 placement 07/21/21  - VAD interrogated personally. Parameters stable.  - Hold Coumadin for likely debridgement of DL. continue IV heparin, INR 1.4  - LDH pending     4 Atrial fibrillation: Paroxysmal  - Had severe AF with RVR 7/22 s/p DC-CV x 2  - Seen by EP. Not a candidate for ablation due to body habitus  (could consider AVN ablation and CRT, if needed)  - Had post-op AF with RVR post VAD  - 10/23 S/P DC-CV with restoration of NSR.  - Remains in NSR Continue amio 200 daily  - Hold coumadin per above. INR 1.4. Continue heparin gtt   5. HTN  - Elevated on admit. Better this morning, MAP 70s - continue home regimen.  - pain control   6. CKD stage 3B  - Has solitary kidney.   - Baseline creatinine is 1.5-1.8.  - BMP pending    7. OSA  - Continue CPAP    8. DM2  - On Jardiance.  - Hgb A1c 9.4. -> 5.9  - PCP following    9. Polyarticular gout  - quiescent    10. Depression/anxiety  - Continue Zoloft '150mg'$  daily     11. Morbid obesity  - Body mass index is 46.64 kg/m.  - Weight coming down  - Hold GLP1RA for OR (last dose  11/2)    I reviewed the LVAD parameters from today, and compared the results to the patient's prior recorded data.  No programming changes were made.  The LVAD is functioning within specified parameters.  The patient performs LVAD self-test daily.  LVAD interrogation was negative for any significant power changes, alarms or PI events/speed drops.  LVAD equipment check completed and is in good working order.  Back-up equipment present.   LVAD education done on emergency procedures and precautions and reviewed exit site care.  Length of Stay: 1  Nelida Gores 08/11/2022, 7:12 AM  VAD Team --- VAD ISSUES ONLY--- Pager [phone] (7am - 7am)  Advanced Heart Failure Team  Pager [phone] (M-F; 7a - 5p)  Please contact [facility] for night-coverage after hours (5p -7a ) and weekends on [email or web address]   Patient seen and examined with the above-signed Advanced Practice Provider and/or Housestaff. I personally reviewed laboratory data, imaging studies and relevant notes. I independently examined the patient and formulated the important aspects of the plan. I have edited the note to reflect any of my changes or salient points. I have personally discussed the plan with the patient and/or family.  Remains on IV abx. Now afebrile. Still with diffuse tenderness across abdomen. On IV heparin.   CT reviewed. Diffuse soft tissue stranding. No abscess.   General:  NAD.  HEENT: normal  Neck: supple. JVP not elevated.  Carotids 2+ bilat; no bruits. No lymphadenopathy or thryomegaly appreciated. Cor: LVAD hum.  Lungs: Clear. Abdomen: obese soft, diffuse erythema. +tender, non-distended. No hepatosplenomegaly. No bruits or masses. Good bowel sounds. Driveline dressing in place.  Extremities: no cyanosis, clubbing, rash. Warm no edema  Neuro: alert & oriented x 3. No focal deficits. Moves all 4 without problem   Continue IV abx. Await cx. CT with diffuse soft-tissue involvement but no overt abscess.  Will review with TCTS.   VAD interrogated personally. Parameters stable.  Can increase heparin slightly. Discussed dosing with PharmD personally.  Glori Bickers, MD  8:50 AM

## 2022-08-11 NOTE — Progress Notes (Signed)
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 Cyndia Bent under Destination Therapy criteria due to BMI and chronic kidney disease.    Pt lying in bed, bedside nurses are changing driveline dressing. Pt states that he is still in quite a bit of pain from driveline. DR Prescott Gum is ordering pain meds.   Cellulits on abdomen is much worse today than yesterday.   Vital signs: Temp:  98.2 HR: 84 NSR Doppler:  110 Auto cuff:  120/69 (74) O2 Sat: 93% on RA Wt: 298 lbs   LVAD interrogation reveals:  Speed: 6200 Flow: 4.8 Power:  5.2 w PI: 3.6 Hct: 39   Alarms:  none Events:  4-8 PI events daily   Fixed speed:  6200 Low speed limit: 5900   Drive Line:  Bedside nurse changed. Per PVT tunnels 10 cm. Large amount of purulent drainage.        Labs:  LDH trend: 130   INR trend: 1.4  WBC: 11.5  Anticoagulation Plan: -INR Goal: 2.0 - 2.5  -ASA Dose: none due to allergy     Blood Products:     Device: - [employer] single ICD -Therapies: on per BS rep on 08/06/21     Infection:  08/10/22>>driveline cx FEW STAPHYLOCOCCUS AUREUS  08/10/22>>BC x 2 NGTD    Plan/Recommendations:  Call VAD Coordinator if any VAD equipment or drive line issues. VAD coordinator will accompany pt to OR in the morning      Tanda Rockers RN [place] Coordinator  Office: [phone]  24/7 Pager: [phone]

## 2022-08-11 NOTE — Consult Note (Addendum)
[place] for Infectious Disease    Date of Admission:  08/10/2022   Total days of inpatient antibiotics 2        Reason for Consult: LVAD wound injection    Principal Problem:   Infection associated with driveline of left ventricular assist device (LVAD) ([place])   Assessment: 83 YM admitted with   #LVAD exit site infection #ICD #Hx of MSSA from LVAD exit site #NICM status post HM 3 VAD  placement on 07/21/2021 #Fever and leukocytosis -resolving -Pt had doxy followed by CTX->30 days of Augmentin for LVAD exit site infection with wound Cx on 12/22/21 + MSSA. He reports he think he had a wound at the site initially after bumping the device. -He noted that Sunday he started having abdominal  pain then purulence from exit site. Following arrival he had temp of 102.3(wbc 11.9k) and admitted for DL infection/abdominal wall cellulitis. -CT showed fluid attenuation around DL in subq fat c/w drive line infection, edema in abdominal wall. Bedside wound Cx+ staph aureus  Recommendations:  -D/C cefepime -Start cefazolin -Continue vancomycin -MRSA screen , if negative will D/C vancomycin -Follow blood Cx -OR tomorrow, follow findings and Cx  #DM -A1C 5.3(3 months ago)    Microbiology:   Antibiotics: Vancomycin and cefepime 11/6-p Cultures: 11/6 blood Cx 11/6 wound Cx staph aureus    HPI: Jeffrey [name] is a 47 y.o. male poorly controlled hypertension, right renal cell carcinoma status post nephrectomy, CKD stage IIIa, diabetes, OSA, gout, morbid obesity, systolic heart failure due to NICM status post HM 3 VAD  placement on 02/77/4128 complicated by driveline site infection with MSSA in March 2023 admitted for driveline infection with abdominal cellulitis.  He presented with abdominal pain and drainage from driveline site which started on Sunday.  Found to have temp of 100.3, wbc 11.9k.  CT chest abdomen pelvis showed fluid around her lungs sound suspicious for infection.   He was started on vancomycin and cefepime.  Wound cultures, bedside grew Staph aureus.  ID engaged for further antibiotic management.   Review of Systems: ROS  Past Medical History:  Diagnosis Date   Anxiety    Aspirin allergy    Childhood asthma    Chronic systolic CHF (congestive heart failure) ([place])    a. EF 20-25% in 2012. b. EF 45-50% in 10/2011 with nonischemic nuc - presumed NICM. c. 12/2014 Echo: Sev depressed LV fxn, sev dil LV, mild LVH, mild MR, sev dil LA, mildly reduced RV fxn.   CKD (chronic kidney disease) stage 2, GFR 60-89 ml/min    H/O vasectomy 12/2019   High cholesterol    Hypertension    Morbid obesity ([place])    Nephrolithiasis    OSA on CPAP    Paroxysmal atrial fibrillation ([place])    Presumed NICM    a. 04/2014 Myoview: EF 26%, glob HK, sev glob HK, ? prior infarct;  b. Never cathed 2/2 CKD.   Renal cell carcinoma ([place])    a. s/p Rt robotic assisted partial converted to radical nephrectomy on 01/2013.   Troponin level elevated    a. 04/2014, 12/2014: felt due to CHF.   Type II diabetes mellitus (HCC)    Ventricular tachycardia ([place])    a. appropriate ICD therapy 12/2017    Social History   Tobacco Use   Smoking status: Former    Packs/day: 0.25    Years: 22.00    Total pack years: 5.50  Types: Cigarettes    Quit date: 04/30/2015    Years since quitting: 7.2   Smokeless tobacco: Never  Vaping Use   Vaping Use: Never used  Substance Use Topics   Alcohol use: Not Currently    Comment: none   Drug use: Yes    Types: Marijuana    Family History  Problem Relation Age of Onset   Hypertension Mother    Diabetes Maternal Aunt    Heart attack Neg Hx    Stroke Neg Hx    Colon cancer Neg Hx    Pancreatic cancer Neg Hx    Stomach cancer Neg Hx    Liver cancer Neg Hx    Esophageal cancer Neg Hx    Scheduled Meds:  allopurinol  100 mg Oral Daily   amiodarone  200 mg Oral Daily   amLODipine  10 mg Oral Daily   Chlorhexidine Gluconate Cloth  6 each  Topical Daily   colchicine  0.6 mg Oral Daily   doxazosin  1 mg Oral Daily   gabapentin  300 mg Oral TID   insulin aspart protamine- aspart  34 Units Subcutaneous BID WC   isosorbide-hydrALAZINE  2 tablet Oral TID   metFORMIN  1,000 mg Oral BID WC   nutrition supplement (JUVEN)  1 packet Oral BID BM   pantoprazole  40 mg Oral Daily   sacubitril-valsartan  1 tablet Oral BID   sertraline  150 mg Oral Daily   spironolactone  25 mg Oral Daily   traZODone  100 mg Oral QHS   Continuous Infusions:   ceFAZolin (ANCEF) IV     heparin 700 Units/hr (08/11/22 1000)   magnesium sulfate bolus IVPB     vancomycin     PRN Meds:.acetaminophen, acetaminophen, morphine injection, naloxone, mouth rinse, oxyCODONE, traMADol Allergies  Allergen Reactions   Aspirin Shortness Of Breath, Itching and Rash     Burning sensation (Patient reports he tolerates other NSAIDS)    Bee Venom Hives and Swelling   Lisinopril Cough   Shrimp (Diagnostic) Rash   Tomato Rash    OBJECTIVE: Blood pressure 120/69, pulse 77, temperature 98.2 F (36.8 C), temperature source Oral, resp. rate (!) 27, height '5\' 7"'$  (1.702 m), weight 135.5 kg, SpO2 93 %.  Physical Exam Constitutional:      General: He is not in acute distress.    Appearance: He is normal weight. He is not toxic-appearing.  HENT:     Head: Normocephalic and atraumatic.     Right Ear: External ear normal.     Left Ear: External ear normal.     Nose: No congestion or rhinorrhea.     Mouth/Throat:     Mouth: Mucous membranes are moist.     Pharynx: Oropharynx is clear.  Eyes:     Extraocular Movements: Extraocular movements intact.     Conjunctiva/sclera: Conjunctivae normal.     Pupils: Pupils are equal, round, and reactive to light.  Cardiovascular:     Rate and Rhythm: Normal rate and regular rhythm.     Heart sounds: No murmur heard.    No friction rub. No gallop.     Comments: LVAD site tender without discharge Pulmonary:     Effort:  Pulmonary effort is normal.     Breath sounds: Normal breath sounds.  Abdominal:     General: Abdomen is flat. Bowel sounds are normal.     Palpations: Abdomen is soft.  Musculoskeletal:        General: No swelling.  Normal range of motion.     Cervical back: Normal range of motion and neck supple.  Skin:    General: Skin is warm and dry.  Neurological:     General: No focal deficit present.     Mental Status: He is oriented to person, place, and time.  Psychiatric:        Mood and Affect: Mood normal.     Lab Results Lab Results  Component Value Date   WBC 11.5 (H) 08/11/2022   HGB 13.6 08/11/2022   HCT 39.0 08/11/2022   MCV 88.0 08/11/2022   PLT 186 08/11/2022    Lab Results  Component Value Date   CREATININE 1.30 (H) 08/11/2022   BUN 17 08/11/2022   NA 138 08/11/2022   K 3.6 08/11/2022   CL 108 08/11/2022   CO2 21 (L) 08/11/2022    Lab Results  Component Value Date   ALT 16 08/10/2022   AST 19 08/10/2022   ALKPHOS 41 08/10/2022   BILITOT 1.1 08/10/2022       Laurice Record, [place] for Infectious Disease [facility] 08/11/2022, 1:02 PM

## 2022-08-12 ENCOUNTER — Encounter (HOSPITAL_COMMUNITY): Payer: Self-pay | Admitting: Cardiology

## 2022-08-12 ENCOUNTER — Inpatient Hospital Stay (HOSPITAL_COMMUNITY): Payer: Medicaid Other | Admitting: Certified Registered"

## 2022-08-12 ENCOUNTER — Encounter (HOSPITAL_COMMUNITY)
Admission: AD | Disposition: A | Discharge status: Home / Self Care | Payer: Self-pay | Source: Home / Self Care | Attending: Internal Medicine

## 2022-08-12 ENCOUNTER — Other Ambulatory Visit: Payer: Self-pay

## 2022-08-12 DIAGNOSIS — I428 Other cardiomyopathies: Secondary | ICD-10-CM | POA: Diagnosis not present

## 2022-08-12 DIAGNOSIS — N1832 Chronic kidney disease, stage 3b: Secondary | ICD-10-CM | POA: Diagnosis not present

## 2022-08-12 DIAGNOSIS — T827XXA Infection and inflammatory reaction due to other cardiac and vascular devices, implants and grafts, initial encounter: Secondary | ICD-10-CM | POA: Diagnosis not present

## 2022-08-12 DIAGNOSIS — Z87891 Personal history of nicotine dependence: Secondary | ICD-10-CM | POA: Diagnosis not present

## 2022-08-12 DIAGNOSIS — I480 Paroxysmal atrial fibrillation: Secondary | ICD-10-CM | POA: Diagnosis not present

## 2022-08-12 DIAGNOSIS — E1122 Type 2 diabetes mellitus with diabetic chronic kidney disease: Secondary | ICD-10-CM

## 2022-08-12 DIAGNOSIS — E861 Hypovolemia: Secondary | ICD-10-CM | POA: Diagnosis not present

## 2022-08-12 DIAGNOSIS — I509 Heart failure, unspecified: Secondary | ICD-10-CM | POA: Diagnosis not present

## 2022-08-12 DIAGNOSIS — N189 Chronic kidney disease, unspecified: Secondary | ICD-10-CM

## 2022-08-12 DIAGNOSIS — I130 Hypertensive heart and chronic kidney disease with heart failure and stage 1 through stage 4 chronic kidney disease, or unspecified chronic kidney disease: Secondary | ICD-10-CM

## 2022-08-12 DIAGNOSIS — Z95811 Presence of heart assist device: Secondary | ICD-10-CM | POA: Diagnosis not present

## 2022-08-12 DIAGNOSIS — I5022 Chronic systolic (congestive) heart failure: Secondary | ICD-10-CM | POA: Diagnosis not present

## 2022-08-12 DIAGNOSIS — F32A Depression, unspecified: Secondary | ICD-10-CM | POA: Diagnosis not present

## 2022-08-12 DIAGNOSIS — Z6841 Body Mass Index (BMI) 40.0 and over, adult: Secondary | ICD-10-CM | POA: Diagnosis not present

## 2022-08-12 DIAGNOSIS — L03311 Cellulitis of abdominal wall: Secondary | ICD-10-CM | POA: Diagnosis not present

## 2022-08-12 HISTORY — PX: APPLICATION OF WOUND VAC: SHX5189

## 2022-08-12 HISTORY — PX: STERNAL WOUND DEBRIDEMENT: SHX1058

## 2022-08-12 LAB — MAGNESIUM: Magnesium: 2.1 mg/dL (ref 1.7–2.4)

## 2022-08-12 LAB — CBC
HCT: 41.9 % (ref 39.0–52.0)
Hemoglobin: 13.6 g/dL (ref 13.0–17.0)
MCH: 29.6 pg (ref 26.0–34.0)
MCHC: 32.5 g/dL (ref 30.0–36.0)
MCV: 91.3 fL (ref 80.0–100.0)
Platelets: 188 10*3/uL (ref 150–400)
RBC: 4.59 MIL/uL (ref 4.22–5.81)
RDW: 14.6 % (ref 11.5–15.5)
WBC: 10.3 10*3/uL (ref 4.0–10.5)
nRBC: 0 % (ref 0.0–0.2)

## 2022-08-12 LAB — GLUCOSE, CAPILLARY
Glucose-Capillary: 103 mg/dL — ABNORMAL HIGH (ref 70–99)
Glucose-Capillary: 111 mg/dL — ABNORMAL HIGH (ref 70–99)
Glucose-Capillary: 121 mg/dL — ABNORMAL HIGH (ref 70–99)
Glucose-Capillary: 121 mg/dL — ABNORMAL HIGH (ref 70–99)
Glucose-Capillary: 161 mg/dL — ABNORMAL HIGH (ref 70–99)

## 2022-08-12 LAB — BASIC METABOLIC PANEL
Anion gap: 8 (ref 5–15)
BUN: 17 mg/dL (ref 6–20)
CO2: 21 mmol/L — ABNORMAL LOW (ref 22–32)
Calcium: 8.8 mg/dL — ABNORMAL LOW (ref 8.9–10.3)
Chloride: 109 mmol/L (ref 98–111)
Creatinine, Ser: 1.24 mg/dL (ref 0.61–1.24)
GFR, Estimated: >60 mL/min (ref >60)
Glucose, Bld: 95 mg/dL (ref 70–99)
Potassium: 4.4 mmol/L (ref 3.5–5.1)
Sodium: 138 mmol/L (ref 135–145)

## 2022-08-12 LAB — LACTATE DEHYDROGENASE: LDH: 147 U/L (ref 98–192)

## 2022-08-12 LAB — PROTIME-INR
INR: 1.2 (ref 0.8–1.2)
Prothrombin Time: 15.2 seconds (ref 11.4–15.2)

## 2022-08-12 LAB — HEPARIN LEVEL (UNFRACTIONATED): Heparin Unfractionated: <0.1 IU/mL — ABNORMAL LOW (ref 0.30–0.70)

## 2022-08-12 SURGERY — DEBRIDEMENT, WOUND, STERNUM
Anesthesia: General | Site: Abdomen

## 2022-08-12 MED ORDER — INSULIN ASPART PROT & ASPART (70-30 MIX) 100 UNIT/ML ~~LOC~~ SUSP
17.0000 [IU] | Freq: Once | SUBCUTANEOUS | Status: AC
  Administered 2022-08-12: 17 [IU] via SUBCUTANEOUS
  Filled 2022-08-12: qty 10

## 2022-08-12 MED ORDER — SODIUM CHLORIDE 0.9 % IV SOLN: INTRAVENOUS | Status: DC | PRN

## 2022-08-12 MED ORDER — ROCURONIUM BROMIDE 10 MG/ML (PF) SYRINGE
PREFILLED_SYRINGE | INTRAVENOUS | Status: AC
  Filled 2022-08-12: qty 20

## 2022-08-12 MED ORDER — LIDOCAINE 2% (20 MG/ML) 5 ML SYRINGE
INTRAMUSCULAR | Status: AC
  Filled 2022-08-12: qty 5

## 2022-08-12 MED ORDER — LACTATED RINGERS IV SOLN: INTRAVENOUS | Status: DC | PRN

## 2022-08-12 MED ORDER — DEXAMETHASONE SODIUM PHOSPHATE 10 MG/ML IJ SOLN
INTRAMUSCULAR | Status: DC | PRN
  Administered 2022-08-12: 5 mg via INTRAVENOUS

## 2022-08-12 MED ORDER — FENTANYL CITRATE (PF) 250 MCG/5ML IJ SOLN
INTRAMUSCULAR | Status: DC | PRN
  Administered 2022-08-12 (×5): 50 ug via INTRAVENOUS

## 2022-08-12 MED ORDER — ROCURONIUM BROMIDE 10 MG/ML (PF) SYRINGE
PREFILLED_SYRINGE | INTRAVENOUS | Status: DC | PRN
  Administered 2022-08-12: 100 mg via INTRAVENOUS
  Administered 2022-08-12: 10 mg via INTRAVENOUS

## 2022-08-12 MED ORDER — PROPOFOL 10 MG/ML IV BOLUS
INTRAVENOUS | Status: DC | PRN
  Administered 2022-08-12 (×2): 20 mg via INTRAVENOUS
  Administered 2022-08-12 (×2): 30 mg via INTRAVENOUS
  Administered 2022-08-12: 20 mg via INTRAVENOUS

## 2022-08-12 MED ORDER — PHENYLEPHRINE 80 MCG/ML (10ML) SYRINGE FOR IV PUSH (FOR BLOOD PRESSURE SUPPORT)
PREFILLED_SYRINGE | INTRAVENOUS | Status: AC
  Filled 2022-08-12: qty 10

## 2022-08-12 MED ORDER — FENTANYL CITRATE (PF) 250 MCG/5ML IJ SOLN
INTRAMUSCULAR | Status: AC
  Filled 2022-08-12: qty 5

## 2022-08-12 MED ORDER — MIDAZOLAM HCL 2 MG/2ML IJ SOLN
INTRAMUSCULAR | Status: AC
  Filled 2022-08-12: qty 2

## 2022-08-12 MED ORDER — PHENYLEPHRINE HCL-NACL 20-0.9 MG/250ML-% IV SOLN
INTRAVENOUS | Status: DC | PRN
  Administered 2022-08-12: 25 ug/min via INTRAVENOUS

## 2022-08-12 MED ORDER — ONDANSETRON HCL 4 MG/2ML IJ SOLN
INTRAMUSCULAR | Status: AC
  Filled 2022-08-12: qty 2

## 2022-08-12 MED ORDER — LIDOCAINE 2% (20 MG/ML) 5 ML SYRINGE
INTRAMUSCULAR | Status: DC | PRN
  Administered 2022-08-12: 60 mg via INTRAVENOUS

## 2022-08-12 MED ORDER — PHENYLEPHRINE 80 MCG/ML (10ML) SYRINGE FOR IV PUSH (FOR BLOOD PRESSURE SUPPORT)
PREFILLED_SYRINGE | INTRAVENOUS | Status: DC | PRN
  Administered 2022-08-12 (×6): 80 ug via INTRAVENOUS

## 2022-08-12 MED ORDER — EPHEDRINE SULFATE-NACL 50-0.9 MG/10ML-% IV SOSY
PREFILLED_SYRINGE | INTRAVENOUS | Status: DC | PRN
  Administered 2022-08-12 (×7): 5 mg via INTRAVENOUS

## 2022-08-12 MED ORDER — HEPARIN (PORCINE) 25000 UT/250ML-% IV SOLN
700.0000 [IU]/h | INTRAVENOUS | Status: DC
  Administered 2022-08-12: 700 [IU]/h via INTRAVENOUS

## 2022-08-12 MED ORDER — 0.9 % SODIUM CHLORIDE (POUR BTL) OPTIME
TOPICAL | Status: DC | PRN
  Administered 2022-08-12: 1000 mL

## 2022-08-12 MED ORDER — SODIUM CHLORIDE 0.9 % IR SOLN
Status: DC | PRN
  Administered 2022-08-12: 1000 mL

## 2022-08-12 MED ORDER — MIDAZOLAM HCL 2 MG/2ML IJ SOLN
INTRAMUSCULAR | Status: DC | PRN
  Administered 2022-08-12: .5 mg via INTRAVENOUS

## 2022-08-12 MED ORDER — ONDANSETRON HCL 4 MG/2ML IJ SOLN
INTRAMUSCULAR | Status: DC | PRN
  Administered 2022-08-12: 4 mg via INTRAVENOUS

## 2022-08-12 MED ORDER — DEXAMETHASONE SODIUM PHOSPHATE 10 MG/ML IJ SOLN
INTRAMUSCULAR | Status: AC
  Filled 2022-08-12: qty 1

## 2022-08-12 MED ORDER — VANCOMYCIN HCL 1000 MG IV SOLR
INTRAVENOUS | Status: AC
  Filled 2022-08-12: qty 20

## 2022-08-12 MED ORDER — EPHEDRINE 5 MG/ML INJ
INTRAVENOUS | Status: AC
  Filled 2022-08-12: qty 10

## 2022-08-12 MED ORDER — SUGAMMADEX SODIUM 500 MG/5ML IV SOLN
INTRAVENOUS | Status: DC | PRN
  Administered 2022-08-12: 300 mg via INTRAVENOUS

## 2022-08-12 MED ORDER — PROPOFOL 10 MG/ML IV BOLUS
INTRAVENOUS | Status: AC
  Filled 2022-08-12: qty 20

## 2022-08-12 MED ORDER — ALBUMIN HUMAN 5 % IV SOLN: INTRAVENOUS | Status: DC | PRN

## 2022-08-12 MED ORDER — OXYCODONE HCL 5 MG PO TABS
10.0000 mg | ORAL_TABLET | ORAL | Status: DC | PRN
  Administered 2022-08-12 – 2022-08-26 (×27): 10 mg via ORAL
  Filled 2022-08-12 (×27): qty 2

## 2022-08-12 SURGICAL SUPPLY — 82 items
APL SKNCLS STERI-STRIP NONHPOA (GAUZE/BANDAGES/DRESSINGS) ×10
ATTRACTOMAT 16X20 MAGNETIC DRP (DRAPES) ×2 IMPLANT
BAG DECANTER FOR FLEXI CONT (MISCELLANEOUS) ×2 IMPLANT
BENZOIN TINCTURE PRP APPL 2/3 (GAUZE/BANDAGES/DRESSINGS) IMPLANT
BLADE CLIPPER SURG (BLADE) ×2 IMPLANT
BLADE SURG 10 STRL SS (BLADE) ×2 IMPLANT
BLADE SURG 15 STRL LF DISP TIS (BLADE) IMPLANT
BLADE SURG 15 STRL SS (BLADE)
BNDG GAUZE DERMACEA FLUFF 4 (GAUZE/BANDAGES/DRESSINGS) IMPLANT
BNDG GZE DERMACEA 4 6PLY (GAUZE/BANDAGES/DRESSINGS)
BRUSH SCRUB EZ PLAIN DRY (MISCELLANEOUS) IMPLANT
CANISTER SUCT 3000ML PPV (MISCELLANEOUS) ×2 IMPLANT
CANISTER WOUND CARE 500ML ATS (WOUND CARE) ×2 IMPLANT
CANISTER WOUNDNEG PRESSURE 500 (CANNISTER) IMPLANT
CATH FOLEY 2WAY SLVR  5CC 16FR (CATHETERS)
CATH FOLEY 2WAY SLVR 5CC 16FR (CATHETERS) IMPLANT
CATH THORACIC 28FR RT ANG (CATHETERS) IMPLANT
CATH THORACIC 36FR (CATHETERS) IMPLANT
CLIP VESOCCLUDE SM WIDE 24/CT (CLIP) IMPLANT
CNTNR URN SCR LID CUP LEK RST (MISCELLANEOUS) IMPLANT
CONN Y 3/8X3/8X3/8  BEN (MISCELLANEOUS)
CONN Y 3/8X3/8X3/8 BEN (MISCELLANEOUS) IMPLANT
CONT SPEC 4OZ STRL OR WHT (MISCELLANEOUS)
CONTAINER PROTECT SURGISLUSH (MISCELLANEOUS) ×4 IMPLANT
COVER SURGICAL LIGHT HANDLE (MISCELLANEOUS) ×4 IMPLANT
DRAPE HALF SHEET 40X57 (DRAPES) IMPLANT
DRAPE INCISE IOBAN 66X45 STRL (DRAPES) IMPLANT
DRAPE LAPAROSCOPIC ABDOMINAL (DRAPES) ×2 IMPLANT
DRAPE SLUSH/WARMER DISC (DRAPES) IMPLANT
DRAPE WARM FLUID 44X44 (DRAPES) IMPLANT
DRESSING VERAFLO CLEANS CC MED (GAUZE/BANDAGES/DRESSINGS) IMPLANT
DRSG AQUACEL AG ADV 3.5X14 (GAUZE/BANDAGES/DRESSINGS) ×2 IMPLANT
DRSG VAC ATS LRG SENSATRAC (GAUZE/BANDAGES/DRESSINGS) ×2 IMPLANT
DRSG VAC ATS MED SENSATRAC (GAUZE/BANDAGES/DRESSINGS) ×2 IMPLANT
DRSG VAC ATS SM SENSATRAC (GAUZE/BANDAGES/DRESSINGS) ×2 IMPLANT
DRSG VERAFLO CLEANSE CC MED (GAUZE/BANDAGES/DRESSINGS) ×2
ELECT BLADE 4.0 EZ CLEAN MEGAD (MISCELLANEOUS) ×2
ELECT REM PT RETURN 9FT ADLT (ELECTROSURGICAL) ×2
ELECTRODE BLDE 4.0 EZ CLN MEGD (MISCELLANEOUS) IMPLANT
ELECTRODE REM PT RTRN 9FT ADLT (ELECTROSURGICAL) ×2 IMPLANT
GAUZE 4X4 16PLY ~~LOC~~+RFID DBL (SPONGE) ×2 IMPLANT
GAUZE PAD ABD 8X10 STRL (GAUZE/BANDAGES/DRESSINGS) IMPLANT
GAUZE SPONGE 4X4 12PLY STRL (GAUZE/BANDAGES/DRESSINGS) ×2 IMPLANT
GAUZE XEROFORM 5X9 LF (GAUZE/BANDAGES/DRESSINGS) IMPLANT
GLOVE BIO SURGEON STRL SZ 6.5 (GLOVE) IMPLANT
GLOVE BIO SURGEON STRL SZ7.5 (GLOVE) ×4 IMPLANT
GLOVE BIOGEL PI IND STRL 6 (GLOVE) IMPLANT
GOWN STRL REUS W/ TWL LRG LVL3 (GOWN DISPOSABLE) ×8 IMPLANT
GOWN STRL REUS W/TWL LRG LVL3 (GOWN DISPOSABLE) ×8
HANDPIECE INTERPULSE COAX TIP (DISPOSABLE) ×4
HEMOSTAT POWDER SURGIFOAM 1G (HEMOSTASIS) IMPLANT
HEMOSTAT SURGICEL 2X14 (HEMOSTASIS) IMPLANT
KIT BASIN OR (CUSTOM PROCEDURE TRAY) ×2 IMPLANT
KIT SUCTION CATH 14FR (SUCTIONS) IMPLANT
KIT TURNOVER KIT B (KITS) ×2 IMPLANT
NS IRRIG 1000ML POUR BTL (IV SOLUTION) ×2 IMPLANT
PACK CHEST (CUSTOM PROCEDURE TRAY) ×2 IMPLANT
PACK GENERAL/GYN (CUSTOM PROCEDURE TRAY) ×2 IMPLANT
PAD ARMBOARD 7.5X6 YLW CONV (MISCELLANEOUS) ×4 IMPLANT
PAD NEG PRESSURE SENSATRAC (MISCELLANEOUS) IMPLANT
SET HNDPC FAN SPRY TIP SCT (DISPOSABLE) ×2 IMPLANT
SOL PREP POV-IOD 4OZ 10% (MISCELLANEOUS) IMPLANT
SPONGE T-LAP 18X18 ~~LOC~~+RFID (SPONGE) ×10 IMPLANT
SPONGE T-LAP 4X18 ~~LOC~~+RFID (SPONGE) ×2 IMPLANT
STAPLER VISISTAT 35W (STAPLE) IMPLANT
SUT ETHILON 3 0 FSL (SUTURE) IMPLANT
SUT STEEL 6MS V (SUTURE) IMPLANT
SUT STEEL STERNAL CCS#1 18IN (SUTURE) IMPLANT
SUT STEEL SZ 6 DBL 3X14 BALL (SUTURE) IMPLANT
SUT VIC AB 1 CTX 36 (SUTURE) ×4
SUT VIC AB 1 CTX36XBRD ANBCTR (SUTURE) ×4 IMPLANT
SUT VIC AB 2-0 CTX 27 (SUTURE) ×4 IMPLANT
SUT VIC AB 3-0 SH 18 (SUTURE) IMPLANT
SUT VIC AB 3-0 X1 27 (SUTURE) ×4 IMPLANT
SWAB COLLECTION DEVICE MRSA (MISCELLANEOUS) IMPLANT
SWAB CULTURE ESWAB REG 1ML (MISCELLANEOUS) IMPLANT
SYR 5ML LL (SYRINGE) IMPLANT
TOWEL GREEN STERILE (TOWEL DISPOSABLE) ×2 IMPLANT
TOWEL GREEN STERILE FF (TOWEL DISPOSABLE) ×2 IMPLANT
TRAY FOLEY MTR SLVR 16FR STAT (SET/KITS/TRAYS/PACK) IMPLANT
WATER STERILE IRR 1000ML POUR (IV SOLUTION) ×2 IMPLANT
YANKAUER SUCT BULB TIP NO VENT (SUCTIONS) IMPLANT

## 2022-08-12 NOTE — Transfer of Care (Signed)
Immediate Anesthesia Transfer of Care Note  Patient: Jeffrey [name]  Procedure(s) Performed: ABDOMINAL WOUND DEBRIDEMENT (Abdomen) APPLICATION OF WOUND VAC  Patient Location: ICU  Anesthesia Type:General  Level of Consciousness: awake, alert , and oriented  Airway & Oxygen Therapy: Patient Spontanous Breathing  Post-op Assessment: Report given to RN and Post -op Vital signs reviewed and stable  Post vital signs: Reviewed and stable  Last Vitals:  Vitals Value Taken Time  BP 103/90 08/12/22 1040  Temp    Pulse 188 08/12/22 1045  Resp    SpO2 93 % 08/12/22 1045  Vitals shown include unvalidated device data.  Last Pain:  Vitals:   08/12/22 0640  TempSrc: Oral  PainSc:       Patients Stated Pain Goal: 0 (17/53/01 0404)  Complications: No notable events documented.

## 2022-08-12 NOTE — Progress Notes (Signed)
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 Cyndia Bent under Destination Therapy criteria due to BMI and chronic kidney disease.    Met pt in [place] prior to driveline debridement. Pt tells me that his pain was better overnight but still there.     Vital signs: Temp:  97.7 HR: 83 NSR Doppler:  91 Auto cuff:  93/77 (84) O2 Sat: 93% on RA Wt: 298>300.9 lbs   LVAD interrogation reveals:  Speed: 6200 Flow: 4.9 Power:  5.2 w PI: 3.1 Hct: 41   Alarms:  none Events:  4-8 PI events daily   Fixed speed:  6200 Low speed limit: 5900   Drive Line:  CDI . [place] secure  Labs:  LDH trend: 130>147   INR trend: 1.4>1.2  WBC: 11.5>10.9  Anticoagulation Plan: -INR Goal: 2.0 - 2.5  -ASA Dose: none due to allergy     Blood Products:     Device: - [employer] single ICD -Therapies: on per BS rep on 08/06/21     Infection:  08/10/22>>driveline cx FEW STAPHYLOCOCCUS AUREUS  08/10/22>>BC x 2 NGTD    Plan/Recommendations:  Call VAD Coordinator if any VAD equipment or drive line issues.      Tanda Rockers RN [place] Coordinator  Office: [phone]  24/7 Pager: [phone]

## 2022-08-12 NOTE — Progress Notes (Addendum)
[place] for Heparin Indication:  VAD  Allergies  Allergen Reactions   Aspirin Shortness Of Breath, Itching and Rash     Burning sensation (Patient reports he tolerates other NSAIDS)    Bee Venom Hives and Swelling   Lisinopril Cough   Shrimp (Diagnostic) Rash   Tomato Rash    Patient Measurements: Height: '5\' 7"'$  (170.2 cm) Weight: (!) 136.5 kg (300 lb 14.9 oz) IBW/kg (Calculated) : 66.1 Heparin Dosing Weight: 98 kg  Vital Signs: Temp: 98.2 F (36.8 C) (11/08 1103) Temp Source: Oral (11/08 1103) BP: 87/75 (11/08 1145) Pulse Rate: 78 (11/08 1145)  Labs: Recent Labs    08/10/22 1702 08/11/22 0656 08/11/22 2154 08/12/22 0436  HGB 13.9 13.6  --  13.6  HCT 41.9 39.0  --  41.9  PLT 218 186  --  188  LABPROT 18.7* 16.9*  --  15.2  INR 1.6* 1.4*  --  1.2  HEPARINUNFRC  --  0.19* <0.10* <0.10*  CREATININE 1.41* 1.30*  --  1.24     Estimated Creatinine Clearance: 98.2 mL/min (by C-G formula based on SCr of 1.24 mg/dL).   Medical History: Past Medical History:  Diagnosis Date   Anxiety    Aspirin allergy    Childhood asthma    Chronic systolic CHF (congestive heart failure) ([place])    a. EF 20-25% in 2012. b. EF 45-50% in 10/2011 with nonischemic nuc - presumed NICM. c. 12/2014 Echo: Sev depressed LV fxn, sev dil LV, mild LVH, mild MR, sev dil LA, mildly reduced RV fxn.   CKD (chronic kidney disease) stage 2, GFR 60-89 ml/min    H/O vasectomy 12/2019   High cholesterol    Hypertension    Morbid obesity ([place])    Nephrolithiasis    OSA on CPAP    Paroxysmal atrial fibrillation ([place])    Presumed NICM    a. 04/2014 Myoview: EF 26%, glob HK, sev glob HK, ? prior infarct;  b. Never cathed 2/2 CKD.   Renal cell carcinoma ([place])    a. s/p Rt robotic assisted partial converted to radical nephrectomy on 01/2013.   Troponin level elevated    a. 04/2014, 12/2014: felt due to CHF.   Type II diabetes mellitus (HCC)    Ventricular tachycardia  (HCC)    a. appropriate ICD therapy 12/2017    Assessment: 46 yo male with a VAD, presents today to clinic w/ signs of  abdominal wall cellulitis and DL infection. + drainage from site. + low grade fever 99.8.  Patient is managed as an outpatient on Warfarin therapy. Pharmacy consulted to start a heparin infusion when INR < 2.  INR 1.2.  Coumadin on hold.  Heparin level undetectable this morning as expected on low dose heparin.  PTA Warfarin - 8 mg daily, except 4 mg on Tues and Sat.  Goal of Therapy:  Heparin level 0.3-0.5 units/ml Monitor platelets by anticoagulation protocol: Yes   Plan:  Per Dr. Prescott Gum, okay to resume IV heparin at 4pm today at previous rate of 700 units/hr. Check heparin level in 8 hrs. Daily heparin level and CBC.  [place] Crane, Roylene Reason, BCCP Clinical Pharmacist  08/12/2022 1:34 PM   [facility] pharmacy phone numbers are listed on [email or web address]

## 2022-08-12 NOTE — Progress Notes (Addendum)
Advanced Heart Failure VAD Team Note  PCP-Cardiologist: Glori Bickers, MD   Subjective:    11/6: Admitted w/ DL infection. CT with diffuse soft-tissue involvement but no overt abscess. Wound Cx growing few staph aureus. BCx NGTD.   On Vanc + cefepime. AF. WBC 10.3K  Just returned from OR s/p debridement + wound vac. Continues w/ pain, though improved. No CP or dyspnea.   MAPs 70s   LVAD INTERROGATION:  HeartMate III LVAD:   Flow 5.0  liters/min, speed 6200, power 5.2, PI 3.0. 2PI events.    Objective:    Vital Signs:   Temp:  [97.6 F (36.4 C)-102.3 F (39.1 C)] 98.1 F (36.7 C) (11/07 0400) Pulse Rate:  [55-99] 77 (11/07 0700) Resp:  [16-42] 27 (11/07 0700) BP: (108-158)/(93-131) 136/108 (11/07 0600) SpO2:  [93 %-100 %] 93 % (11/07 0700) Weight:  [135.1 kg-135.5 kg] 135.5 kg (11/07 0500) Last BM Date :  (PTA) Mean arterial Pressure 74  Intake/Output:   Intake/Output Summary (Last 24 hours) at 08/11/2022 9983 Last data filed at 08/11/2022 0700 Gross per 24 hour  Intake 1097.99 ml  Output 600 ml  Net 497.99 ml     Physical Exam    General:  Well appearing. Laying in bed No resp difficulty HEENT: normal Neck: supple. Thick neck, JVD not well visualized . Carotids 2+ bilat; no bruits. No lymphadenopathy or thyromegaly appreciated. Cor: Mechanical heart sounds with LVAD hum present. Lungs: CTAB  Abdomen: soft, nontender, nondistended. No hepatosplenomegaly. No bruits or masses. Good bowel sounds. Driveline: C/D/I; securement device intact and driveline incorporated, + wound vac Extremities: no cyanosis, clubbing, rash, edema Neuro: alert & orientedx3, cranial nerves grossly intact. moves all 4 extremities w/o difficulty. Affect pleasant   Telemetry   NSR 70s  EKG    No new EKG to review   Labs   Basic Metabolic Panel: Recent Labs  Lab 08/05/22 1100 08/10/22 1702  NA 141 138  K 3.8 4.0  CL 114* 108  CO2 21* 21*  GLUCOSE 111* 101*  BUN 23* 17   CREATININE 1.40* 1.41*  CALCIUM 9.7 9.5    Liver Function Tests: Recent Labs  Lab 08/05/22 1100 08/10/22 1702  AST 22 19  ALT 23 16  ALKPHOS 66 41  BILITOT 0.6 1.1  PROT 6.9 6.9  ALBUMIN 3.7 3.4*   No results for input(s): "LIPASE", "AMYLASE" in the last 168 hours. No results for input(s): "AMMONIA" in the last 168 hours.  CBC: Recent Labs  Lab 08/05/22 1100 08/10/22 1702  WBC 5.9 11.9*  HGB 14.3 13.9  HCT 42.6 41.9  MCV 88.9 89.7  PLT 222 218    INR: Recent Labs  Lab 08/05/22 1100 08/10/22 1702  INR 1.3* 1.6*    Other results: EKG:    Imaging   No results found.   Medications:     Scheduled Medications:  allopurinol  100 mg Oral Daily   amiodarone  200 mg Oral Daily   amLODipine  10 mg Oral Daily   Chlorhexidine Gluconate Cloth  6 each Topical Daily   colchicine  0.6 mg Oral Daily   doxazosin  1 mg Oral Daily   gabapentin  300 mg Oral TID   insulin aspart protamine- aspart  34 Units Subcutaneous BID WC   isosorbide-hydrALAZINE  2 tablet Oral TID   metFORMIN  1,000 mg Oral BID WC   pantoprazole  40 mg Oral Daily   sacubitril-valsartan  1 tablet Oral BID   sertraline  150  mg Oral Daily   spironolactone  25 mg Oral Daily   traZODone  100 mg Oral QHS    Infusions:  ceFEPime (MAXIPIME) IV Stopped (08/11/22 0550)   heparin 1,200 Units/hr (08/11/22 0700)   vancomycin      PRN Medications: acetaminophen, acetaminophen, HYDROmorphone (DILAUDID) injection, naloxone, mouth rinse, oxyCODONE, traMADol   Patient Profile   46 y.o. male with a history of  poorly controlled HTN, R renal cell carcinoma s/p nephrectomy, CKD IIIa, DM2, OSA, gout, morbid obesity and systolic HF due to NICM. 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    Assessment/Plan:     1. DL infection/ Abdominal Wall Cellulitis  - prior DL infection 3/10, wound cx + rare staph aureus, resolved w/ outpatient abx  - now w/ recurrent  infection  - wound Cx + staph aureus, blood cx NGTD - CT with diffuse soft-tissue involvement but no overt abscess - on vanc + cefepime. ID following   - s/p debridement + wound vac 11/8. Plan return to OR next week.     2.  Chronic systolic CHF  - Nonischemic CM, end-stage  - EF 2012; 20-25%  - Echo  8/22: EF < 20%, severe LV dilation, moderate RV dysfunction.   - RHC (8/22): well compensated hemodynamics.  - s/p S-ICD  - S/p HM3 LVAD 10/17 (DT)  - Doing great NYHA I. Volume status looks good - MAPs better this morning  - Continue Bidil 2 tabs tid  - Continue spironolactone 25 mg daily  - Continue entresto to 97/130 bid  - Stressed need to continue weight loss efforts (see below). Continue Ozempic. Once BMI closer to 35 will refer to [facility] for begin transplant evaluation (Body mass index is 46.64 kg/m.)    3. VAD  - s/p HM-3 placement 07/21/21  - VAD interrogated personally. Parameters stable.  - Hold Coumadin for debridgement of DL. Resume heparin later this evening  - LDH ok     4 Atrial fibrillation: Paroxysmal  - Had severe AF with RVR 7/22 s/p DC-CV x 2  - Seen by EP. Not a candidate for ablation due to body habitus  (could consider AVN ablation and CRT, if needed)  - Had post-op AF with RVR post VAD  - 10/23 S/P DC-CV with restoration of NSR.  - Remains in NSR Continue amio 200 daily  - Hold coumadin per above. Resume heparin gtt later tonight    5. HTN  - Elevated on admit. Better this morning, MAP 70s - continue home regimen.  - pain control   6. CKD stage 3B  - Has solitary kidney.   - Baseline creatinine is 1.5-1.8.  - SCr ok, 1.24    7. OSA  - Continue CPAP    8. DM2  - On Jardiance.  - Hgb A1c 9.4. -> 5.9  - PCP following    9. Polyarticular gout  - quiescent    10. Depression/anxiety  - Continue Zoloft '150mg'$  daily     11. Morbid obesity  - Body mass index is 46.64 kg/m.  - Weight coming down  - Hold GLP1RA for OR (last dose 11/2)    I  reviewed the LVAD parameters from today, and compared the results to the patient's prior recorded data.  No programming changes were made.  The LVAD is functioning within specified parameters.  The patient performs LVAD self-test daily.  LVAD interrogation was negative for any significant power changes, alarms or PI  events/speed drops.  LVAD equipment check completed and is in good working order.  Back-up equipment present.   LVAD education done on emergency procedures and precautions and reviewed exit site care.  Length of Stay: 1  Nelida Gores 08/11/2022, 7:12 AM  VAD Team --- VAD ISSUES ONLY--- Pager [phone] (7am - 7am)  Advanced Heart Failure Team  Pager [phone] (M-F; 7a - 5p)  Please contact [facility] for night-coverage after hours (5p -7a ) and weekends on [email or web address]  Patient seen and examined with the above-signed Advanced Practice Provider and/or Housestaff. I personally reviewed laboratory data, imaging studies and relevant notes. I independently examined the patient and formulated the important aspects of the plan. I have edited the note to reflect any of my changes or salient points. I have personally discussed the plan with the patient and/or family.  He is s/p extensive DL debridement earlier today. Remains afebrile on IV abx. Still with significant pain. INR 1.2. Will resume heparin this evening.   General:  Lying in bed No resp difficulty HEENT: normal Neck: supple. no JVD. Carotids 2+ bilat; no bruits. No lymphadenopathy or thryomegaly appreciated. Cor: PMI nondisplaced. VAD hum.  Lungs: clear Abdomen: obese + wound vac in place. + tender, nondistended. No hepatosplenomegaly. No bruits or masses. Good bowel sounds. Extremities: no cyanosis, clubbing, rash, edema Neuro: alert & orientedx3, cranial nerves grossly intact. moves all 4 extremities w/o difficulty. Affect pleasant  He is now s/p extensive debridement of DL infection. Will continue IV abx. Continue  wound VAC. Start heparin at low dose tonight. Will need close monitoring. Await cx data.   VAD interrogated personally. Parameters stable.  Glori Bickers, MD  6:32 PM

## 2022-08-12 NOTE — Op Note (Unsigned)
[name], [name] MEDICAL RECORD NO: [id number] ACCOUNT NO: [id number] DATE OF BIRTH: 1976/04/25 FACILITY: [facility] LOCATION: [facility] PHYSICIAN: Ivin Poot III, MD  Operative Report   DATE OF PROCEDURE: 08/12/2022  OPERATION:   1.  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 2.  Wound irrigation with pulse lavage vancomycin irrigation. 3.  Placement of wound VAC therapy system.  PREOPERATIVE DIAGNOSES:   1.  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   2.  Diabetes mellitus.   3.  Morbid obesity.  POSTOPERATIVE DIAGNOSES:   1.  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   2.  Diabetes mellitus.   3.  Morbid obesity.  SURGEON:  Ivin Poot, M.D.  ASSISTANT:  Stacy Gardner RNFA.  ANESTHESIA:  General.  DESCRIPTION OF PROCEDURE:  The patient was brought from preoperative holding where informed consent was documented and final issues regarding the procedure were discussed with the patient and the VAD coordinator.  The VAD coordinator accompanying the  patient and was present the entire operative procedure to operate the VAD equipment and monitor and help direct management of hemodynamics.  The patient was placed supine on the operating room table.  General anesthesia was induced and the patient was  intubated.  The chest and abdomen were prepped centered on the access site of the driveline power cord.  A proper timeout was performed.  The driveline tunnel was probed and extended fairly deeply into the abdominal midline.  Cultures were taken and sent to microbiology.  The tunnel was opened over the power cord extending to the midline and then turning superiorly for approximately 15 cm.   There was indurated edematous tissue, mainly in the subcutaneous area, but  also extending into the anterior rectus sheath.  The abnormal infected subcutaneous tissue was excised as well as the capsule of the previously established tunnel.  There was a  pocket of cloudy fluid at the midline and this was widely opened and drained and the indurated tissue of the tunneled capsule was excised.  Hemostasis was then achieved.  The wound was irrigated with pulse lavage of both saline and vancomycin.  The velour which was exposed on the power cord was washed with pulse lavage to remove any bacterial biofilm.  Following a wound VAC sponge was cut to the appropriate configuration and size and placed into the wound and covered with a wound VAC sheets and connected  to the wound VAC suction catheter.  There was good compression of the wound VAC sponge that conformed to the wound.  The patient was then reversed from anesthesia and was in stable condition for transfer back to his ICU room.  Blood loss was minimal.     SUJ D: 08/12/2022 12:15:11 pm T: 08/12/2022 11:41:00 pm  JOB: [id number]/ [id number]

## 2022-08-12 NOTE — Anesthesia Preprocedure Evaluation (Addendum)
Anesthesia Evaluation  Patient identified by MRN, date of birth, ID band Patient awake    Reviewed: Allergy & Precautions, NPO status , Patient's Chart, lab work & pertinent test results  History of Anesthesia Complications Negative for: history of anesthetic complications  Airway Mallampati: I  TM Distance: >3 FB Neck ROM: Full    Dental  (+) Teeth Intact, Dental Advisory Given   Pulmonary neg shortness of breath, asthma , sleep apnea , neg recent URI, former smoker   breath sounds clear to auscultation       Cardiovascular hypertension, Pt. on medications +CHF  + Cardiac Defibrillator  Rhythm:Regular  1. Left ventricular ejection fraction, by estimation, is <20%. The left  ventricle has severely decreased function. The left ventricle demonstrates  global hypokinesis. The left ventricular internal cavity size was  moderately dilated. The septum is  shifted towards the RV.   2. Right ventricular systolic function is moderately reduced. The right  ventricular size is mildly enlarged.   3. The aortic valve opens 2/3 beats. The aortic valve is tricuspid.   4. LVAD ramp echo, the LVAD inflow cannula is not visualized.     Neuro/Psych  PSYCHIATRIC DISORDERS Anxiety        GI/Hepatic negative GI ROS, Neg liver ROS,,,  Endo/Other  diabetes  Lab Results      Component                Value               Date                      HGBA1C                   5.3                 03/16/2022             Renal/GU CRFRenal diseaseLab Results      Component                Value               Date                      CREATININE               1.24                08/12/2022                Musculoskeletal  (+) Arthritis ,    Abdominal   Peds  Hematology Lab Results      Component                Value               Date                      WBC                      10.3                08/12/2022                HGB                       13.6  08/12/2022                HCT                      41.9                08/12/2022                MCV                      91.3                08/12/2022                PLT                      188                 08/12/2022             Lab Results      Component                Value               Date                      INR                      1.2                 08/12/2022                INR                      1.4 (H)             08/11/2022                INR                      1.6 (H)             08/10/2022              Anesthesia Other Findings   Reproductive/Obstetrics                              Anesthesia Physical Anesthesia Plan  ASA: 4  Anesthesia Plan: General   Post-op Pain Management: Ofirmev IV (intra-op)*   Induction: Intravenous  PONV Risk Score and Plan: 2 and Ondansetron and Dexamethasone  Airway Management Planned: Oral ETT  Additional Equipment: ClearSight  Intra-op Plan:   Post-operative Plan: Extubation in OR  Informed Consent: I have reviewed the patients History and Physical, chart, labs and discussed the procedure including the risks, benefits and alternatives for the proposed anesthesia with the patient or authorized representative who has indicated his/her understanding and acceptance.     Dental advisory given  Plan Discussed with: CRNA  Anesthesia Plan Comments: (Will start with clearsight and transition to a line if issues with hemodynamic monitoring)         Anesthesia Quick Evaluation

## 2022-08-12 NOTE — Anesthesia Procedure Notes (Signed)
Procedure Name: Intubation Date/Time: 08/12/2022 8:50 AM  Performed by: Dorthea Cove, CRNAPre-anesthesia Checklist: Patient identified, Emergency Drugs available, Suction available and Patient being monitored Patient Re-evaluated:Patient Re-evaluated prior to induction Oxygen Delivery Method: Circle system utilized Preoxygenation: Pre-oxygenation with 100% oxygen Induction Type: IV induction Ventilation: Mask ventilation without difficulty Laryngoscope Size: Mac and 4 Grade View: Grade I Tube type: Oral Tube size: 7.5 mm Number of attempts: 1 Airway Equipment and Method: Stylet and Oral airway Placement Confirmation: ETT inserted through vocal cords under direct vision, positive ETCO2 and breath sounds checked- equal and bilateral Secured at: 23 cm Tube secured with: Tape Dental Injury: Teeth and Oropharynx as per pre-operative assessment

## 2022-08-12 NOTE — Progress Notes (Signed)
VAD Coordinator Procedure Note:   VAD Coordinator met patient in SS. Pt undergoing driveline debridement per Dr. Prescott Gum. Hemodynamics and VAD parameters monitored by myself and anesthesia throughout the procedure. Blood pressures were obtained with automatic cuff on left arm along with [place] non invasive BP.    Time: Non-invBP Auto  BP Flow PI Power Speed  Pre-procedure:  0835 129/93 (104) 111/102 (106) 4.9 3.1 5.2 6200                    Sedation Induction: 0845 116/98 (104)  5 2.8 5.2 6200   0850 115/97 (103) 104/77(86) 4.9 3.2 5.3 6200   0900 117/77 (88) 136/118 (125) 5.2 3 5.1 6200   0915 113/84 (93) 95/81(87) 5.3 3 5.2 6200   0930 110/81 (90) (77) 5.3 2.6 5.1 6200   0945 126/88 (98)  5.3 2.6 5.3 6200   1000 87/70 (75) 72/63(68) 5.3 2.3 5.2 6200   1015 93/68 (74) 94/61(71) 5.2 2.4 5 6200           2Heart 1035  103/90(96) 5.2 2.1 5.2 6200             Patient tolerated the procedure well. VAD Coordinator accompanied and remained with patient in recovery area.    Patient Disposition: Pt transported to [facility] and handoff given to [place], Therapist, sports.   Tanda Rockers RN, BSN VAD Coordinator 24/7 Pager [phone]

## 2022-08-12 NOTE — Progress Notes (Signed)
EVENING ROUNDS NOTE :     [address]       [place] [address]             [phone]                 Day of Surgery Procedure(s) (LRB): ABDOMINAL WOUND DEBRIDEMENT (N/A) APPLICATION OF WOUND VAC (N/A)   Total Length of Stay:  LOS: 2 days  Events:   No events Resting comfortably    BP 97/86 (BP Location: Left Arm)   Pulse 67   Temp 98 F (36.7 C) (Oral)   Resp 18   Ht '5\' 7"'$  (1.702 m)   Wt (!) 136.5 kg   SpO2 99%   BMI 47.13 kg/m          sodium chloride 10 mL/hr at 08/12/22 1500    ceFAZolin (ANCEF) IV Stopped (08/12/22 1451)   heparin 700 Units/hr (08/12/22 1627)    I/O last 3 completed shifts: In: 2621.8 [P.O.:1120; I.V.:229.2; IV Piggyback:1272.6] Out: 1250 [Urine:1250]      Latest Ref Rng & Units 08/12/2022    4:36 AM 08/11/2022    6:56 AM 08/10/2022    5:02 PM  CBC  WBC 4.0 - 10.5 K/uL 10.3  11.5  11.9   Hemoglobin 13.0 - 17.0 g/dL 13.6  13.6  13.9   Hematocrit 39.0 - 52.0 % 41.9  39.0  41.9   Platelets 150 - 400 K/uL 188  186  218        Latest Ref Rng & Units 08/12/2022    4:36 AM 08/11/2022    6:56 AM 08/10/2022    5:02 PM  BMP  Glucose 70 - 99 mg/dL 95  110  101   BUN 6 - 20 mg/dL '17  17  17   '$ Creatinine 0.61 - 1.24 mg/dL 1.24  1.30  1.41   Sodium 135 - 145 mmol/L 138  138  138   Potassium 3.5 - 5.1 mmol/L 4.4  3.6  4.0   Chloride 98 - 111 mmol/L 109  108  108   CO2 22 - 32 mmol/L '21  21  21   '$ Calcium 8.9 - 10.3 mg/dL 8.8  9.0  9.5     ABG    Component Value Date/Time   PHART 7.368 07/25/2021 0436   PCO2ART 50.3 (H) 07/25/2021 0436   PO2ART 73 (L) 07/25/2021 0436   HCO3 28.9 (H) 07/25/2021 0436   TCO2 30 07/25/2021 0436   ACIDBASEDEF 1.0 07/24/2021 0449   O2SAT 73.7 08/05/2021 0515       Melodie Bouillon, MD 08/12/2022 4:42 PM

## 2022-08-12 NOTE — Progress Notes (Signed)
Pre Procedure note for inpatients:   Jeffrey [name] has been scheduled for Procedure(s): ABDOMINAL WOUND DEBRIDEMENT (N/A) APPLICATION OF WOUND VAC (N/A) today. The various methods of treatment have been discussed with the patient. After consideration of the risks, benefits and treatment options the patient has consented to the planned procedure.   The patient has been seen and labs reviewed. There are no changes in the patient's condition to prevent proceeding with the planned procedure today.  Recent labs:  Lab Results  Component Value Date   WBC 10.3 08/12/2022   HGB 13.6 08/12/2022   HCT 41.9 08/12/2022   PLT 188 08/12/2022   GLUCOSE 95 08/12/2022   CHOL 174 06/10/2021   TRIG 120 06/10/2021   HDL 37 (L) 06/10/2021   LDLCALC 115 (H) 06/10/2021   ALT 16 08/10/2022   AST 19 08/10/2022   NA 138 08/12/2022   K 4.4 08/12/2022   CL 109 08/12/2022   CREATININE 1.24 08/12/2022   BUN 17 08/12/2022   CO2 21 (L) 08/12/2022   TSH 2.696 08/05/2022   INR 1.2 08/12/2022   HGBA1C 5.3 03/16/2022   MICROALBUR '150mg'$  02/17/2017    Dahlia Byes, MD 08/12/2022 7:33 AM

## 2022-08-12 NOTE — Progress Notes (Signed)
[place] for Heparin Indication:  VAD  Allergies  Allergen Reactions   Aspirin Shortness Of Breath, Itching and Rash     Burning sensation (Patient reports he tolerates other NSAIDS)    Bee Venom Hives and Swelling   Lisinopril Cough   Shrimp (Diagnostic) Rash   Tomato Rash    Patient Measurements: Height: '5\' 7"'$  (170.2 cm) Weight: (!) 136.5 kg (300 lb 14.9 oz) IBW/kg (Calculated) : 66.1 Heparin Dosing Weight: 98 kg  Vital Signs: Temp: 98 F (36.7 C) (11/08 1608) Temp Source: Oral (11/08 1608) BP: 97/86 (11/08 1600) Pulse Rate: 67 (11/08 1608)  Labs: Recent Labs    08/10/22 1702 08/11/22 0656 08/11/22 2154 08/12/22 0436  HGB 13.9 13.6  --  13.6  HCT 41.9 39.0  --  41.9  PLT 218 186  --  188  LABPROT 18.7* 16.9*  --  15.2  INR 1.6* 1.4*  --  1.2  HEPARINUNFRC  --  0.19* <0.10* <0.10*  CREATININE 1.41* 1.30*  --  1.24     Estimated Creatinine Clearance: 98.2 mL/min (by C-G formula based on SCr of 1.24 mg/dL).   Medical History: Past Medical History:  Diagnosis Date   Anxiety    Aspirin allergy    Childhood asthma    Chronic systolic CHF (congestive heart failure) ([name])    a. EF 20-25% in 2012. b. EF 45-50% in 10/2011 with nonischemic nuc - presumed NICM. c. 12/2014 Echo: Sev depressed LV fxn, sev dil LV, mild LVH, mild MR, sev dil LA, mildly reduced RV fxn.   CKD (chronic kidney disease) stage 2, GFR 60-89 ml/min    H/O vasectomy 12/2019   High cholesterol    Hypertension    Morbid obesity ([place])    Nephrolithiasis    OSA on CPAP    Paroxysmal atrial fibrillation ([place])    Presumed NICM    a. 04/2014 Myoview: EF 26%, glob HK, sev glob HK, ? prior infarct;  b. Never cathed 2/2 CKD.   Renal cell carcinoma ([place])    a. s/p Rt robotic assisted partial converted to radical nephrectomy on 01/2013.   Troponin level elevated    a. 04/2014, 12/2014: felt due to CHF.   Type II diabetes mellitus (HCC)    Ventricular tachycardia  (HCC)    a. appropriate ICD therapy 12/2017    Assessment: 46 yo male with a VAD, presents today to clinic w/ signs of  abdominal wall cellulitis and DL infection. + drainage from site. + low grade fever 99.8.  Patient is managed as an outpatient on Warfarin therapy. Pharmacy consulted to start a heparin infusion when INR < 2.  Heparin resumed postop and pt developed oozing at site of debridement with bright red blood draining into wound vac. Discussed with Dr. [name] - will hold heparin for now and if bleeding is resolved will resume at 500 units/h at midnight.  PTA Warfarin - 8 mg daily, except 4 mg on Tues and Sat.  Goal of Therapy:  Heparin level 0.3-0.5 units/ml Monitor platelets by anticoagulation protocol: Yes   Plan:  Hold heparin as above Assess hemostasis at midnight  Arrie Senate, PharmD, [place], [facility] Clinical Pharmacist [phone] Please check AMION for all [place] numbers 08/12/2022

## 2022-08-12 NOTE — Brief Op Note (Signed)
08/12/2022  10:45 AM  PATIENT:  Jeffrey [name]  46 y.o. male  PRE-OPERATIVE DIAGNOSIS:  DRIVELINE INFECTION  POST-OPERATIVE DIAGNOSIS:  DRIVELINE INFECTION  PROCEDURE:  Procedure(s): ABDOMINAL WOUND DEBRIDEMENT (N/A) APPLICATION OF WOUND VAC (N/A) Debridement with excision of skin, subcutaneous fat, abdominal fascia SURGEON:  Surgeon(s) and Role:    Dahlia Byes, MD - Primary  PHYSICIAN ASSISTANT:   ASSISTANTS: Alcide Goodness RNFA   ANESTHESIA:   general  EBL:  40 cc  BLOOD ADMINISTERED:none  DRAINS: none   LOCAL MEDICATIONS USED:  NONE  SPECIMEN:  Excision  DISPOSITION OF SPECIMEN:   micro lab  COUNTS:  YES  TOURNIQUET:  * No tourniquets in log *  DICTATION: .Dragon Dictation  PLAN OF CARE:  return to [facility] 02  PATIENT DISPOSITION:  ICU - extubated and stable.   Delay start of Pharmacological VTE agent (>24hrs) due to surgical blood loss or risk of bleeding: yes Resume iv heparin 1600 08-12-22     no coumadin for now

## 2022-08-13 ENCOUNTER — Encounter (HOSPITAL_COMMUNITY): Payer: Self-pay | Admitting: Cardiothoracic Surgery

## 2022-08-13 ENCOUNTER — Other Ambulatory Visit: Payer: Self-pay

## 2022-08-13 ENCOUNTER — Other Ambulatory Visit (HOSPITAL_COMMUNITY): Payer: Medicaid Other

## 2022-08-13 DIAGNOSIS — T827XXA Infection and inflammatory reaction due to other cardiac and vascular devices, implants and grafts, initial encounter: Secondary | ICD-10-CM | POA: Diagnosis not present

## 2022-08-13 LAB — BASIC METABOLIC PANEL
Anion gap: 5 (ref 5–15)
BUN: 16 mg/dL (ref 6–20)
CO2: 25 mmol/L (ref 22–32)
Calcium: 8.9 mg/dL (ref 8.9–10.3)
Chloride: 107 mmol/L (ref 98–111)
Creatinine, Ser: 1.31 mg/dL — ABNORMAL HIGH (ref 0.61–1.24)
GFR, Estimated: >60 mL/min (ref >60)
Glucose, Bld: 87 mg/dL (ref 70–99)
Potassium: 4.7 mmol/L (ref 3.5–5.1)
Sodium: 137 mmol/L (ref 135–145)

## 2022-08-13 LAB — HEPARIN LEVEL (UNFRACTIONATED): Heparin Unfractionated: <0.1 IU/mL — ABNORMAL LOW (ref 0.30–0.70)

## 2022-08-13 LAB — CBC
HCT: 41.2 % (ref 39.0–52.0)
Hemoglobin: 13 g/dL (ref 13.0–17.0)
MCH: 29.5 pg (ref 26.0–34.0)
MCHC: 31.6 g/dL (ref 30.0–36.0)
MCV: 93.4 fL (ref 80.0–100.0)
Platelets: 230 10*3/uL (ref 150–400)
RBC: 4.41 MIL/uL (ref 4.22–5.81)
RDW: 14.7 % (ref 11.5–15.5)
WBC: 9.1 10*3/uL (ref 4.0–10.5)
nRBC: 0 % (ref 0.0–0.2)

## 2022-08-13 LAB — PROTIME-INR
INR: 1.2 (ref 0.8–1.2)
Prothrombin Time: 14.8 seconds (ref 11.4–15.2)

## 2022-08-13 LAB — GLUCOSE, CAPILLARY
Glucose-Capillary: 98 mg/dL (ref 70–99)
Glucose-Capillary: 54 mg/dL — ABNORMAL LOW (ref 70–99)
Glucose-Capillary: 65 mg/dL — ABNORMAL LOW (ref 70–99)
Glucose-Capillary: 100 mg/dL — ABNORMAL HIGH (ref 70–99)
Glucose-Capillary: 118 mg/dL — ABNORMAL HIGH (ref 70–99)

## 2022-08-13 LAB — LACTATE DEHYDROGENASE: LDH: 128 U/L (ref 98–192)

## 2022-08-13 LAB — AEROBIC CULTURE W GRAM STAIN (SUPERFICIAL SPECIMEN): Gram Stain: NONE SEEN

## 2022-08-13 MED ORDER — HEPARIN (PORCINE) 25000 UT/250ML-% IV SOLN
500.0000 [IU]/h | INTRAVENOUS | Status: DC
  Administered 2022-08-13 – 2022-08-16 (×3): 500 [IU]/h via INTRAVENOUS
  Filled 2022-08-13 (×3): qty 250

## 2022-08-13 MED ORDER — INSULIN ASPART PROT & ASPART (70-30 MIX) 100 UNIT/ML ~~LOC~~ SUSP
30.0000 [IU] | Freq: Two times a day (BID) | SUBCUTANEOUS | Status: DC
  Filled 2022-08-13 (×2): qty 10

## 2022-08-13 MED ORDER — INSULIN ASPART 100 UNIT/ML IJ SOLN
0.0000 [IU] | Freq: Three times a day (TID) | INTRAMUSCULAR | Status: DC
  Administered 2022-08-19 – 2022-08-25 (×7): 1 [IU] via SUBCUTANEOUS

## 2022-08-13 NOTE — Progress Notes (Signed)
ANTICOAGULATION CONSULT NOTE - Follow Up Consult  Pharmacy Consult for heparin Indication:  VAD  Labs: Recent Labs    08/10/22 1702 08/11/22 0656 08/11/22 2154 08/12/22 0436  HGB 13.9 13.6  --  13.6  HCT 41.9 39.0  --  41.9  PLT 218 186  --  188  LABPROT 18.7* 16.9*  --  15.2  INR 1.6* 1.4*  --  1.2  HEPARINUNFRC  --  0.19* <0.10* <0.10*  CREATININE 1.41* 1.30*  --  1.24    Assessment/Plan:  45yo male on heparin while Coumadin on hold, heparin paused for bloody output from wound vac; RN reports that there has been no further bleeding with little to no output since 7p. Will resume heparin infusion at current rate of 500 units/hr and monitor daily level.   Wynona Neat, PharmD, BCPS  08/13/2022,12:52 AM

## 2022-08-13 NOTE — Progress Notes (Signed)
[place] for Heparin Indication:  VAD  Allergies  Allergen Reactions   Aspirin Shortness Of Breath, Itching and Rash     Burning sensation (Patient reports he tolerates other NSAIDS)    Bee Venom Hives and Swelling   Lisinopril Cough   Shrimp (Diagnostic) Rash    Patient Measurements: Height: '5\' 7"'$  (170.2 cm) Weight: (!) 136.3 kg (300 lb 7.8 oz) IBW/kg (Calculated) : 66.1 Heparin Dosing Weight: 98 kg  Vital Signs: Temp: 98.3 F (36.8 C) (11/09 0313) Temp Source: Oral (11/09 0313) BP: 97/78 (11/09 1109) Pulse Rate: 132 (11/09 1109)  Labs: Recent Labs    08/11/22 0656 08/11/22 2154 08/12/22 0436 08/13/22 0659  HGB 13.6  --  13.6 13.0  HCT 39.0  --  41.9 41.2  PLT 186  --  188 230  LABPROT 16.9*  --  15.2 14.8  INR 1.4*  --  1.2 1.2  HEPARINUNFRC 0.19* <0.10* <0.10* <0.10*  CREATININE 1.30*  --  1.24 1.31*     Estimated Creatinine Clearance: 92.9 mL/min (A) (by C-G formula based on SCr of 1.31 mg/dL (H)).   Medical History: Past Medical History:  Diagnosis Date   Anxiety    Aspirin allergy    Childhood asthma    Chronic systolic CHF (congestive heart failure) ([place])    a. EF 20-25% in 2012. b. EF 45-50% in 10/2011 with nonischemic nuc - presumed NICM. c. 12/2014 Echo: Sev depressed LV fxn, sev dil LV, mild LVH, mild MR, sev dil LA, mildly reduced RV fxn.   CKD (chronic kidney disease) stage 2, GFR 60-89 ml/min    H/O vasectomy 12/2019   High cholesterol    Hypertension    Morbid obesity ([place])    Nephrolithiasis    OSA on CPAP    Paroxysmal atrial fibrillation ([place])    Presumed NICM    a. 04/2014 Myoview: EF 26%, glob HK, sev glob HK, ? prior infarct;  b. Never cathed 2/2 CKD.   Renal cell carcinoma ([place])    a. s/p Rt robotic assisted partial converted to radical nephrectomy on 01/2013.   Troponin level elevated    a. 04/2014, 12/2014: felt due to CHF.   Type II diabetes mellitus (HCC)    Ventricular tachycardia (HCC)     a. appropriate ICD therapy 12/2017    Assessment: 46 yo male with a VAD, presents today to clinic w/ signs of  abdominal wall cellulitis and DL infection. + drainage from site. + low grade fever 99.8.  Patient is managed as an outpatient on Warfarin therapy. Pharmacy consulted to start a heparin infusion when INR < 2.  INR 1.2.  Coumadin on hold.  Heparin level undetectable this morning as expected on low dose heparin.  Had bleeding late last night around surgical site, but resolved quickly and none since per RN report.  PTA Warfarin - 8 mg daily, except 4 mg on Tues and Sat.  Planning to go back to OR Monday or Tues.  Goal of Therapy:  Heparin level 0.3-0.5 units/ml Monitor platelets by anticoagulation protocol: Yes   Plan:  Continue IV heparin at low rate of 500 units/hr Daily heparin level and CBC. Resume low-dose Coumadin soon?  [place] Crane, Vena Austria, BCPS, BCCP Clinical Pharmacist  08/13/2022 1:25 PM   [facility] pharmacy phone numbers are listed on [email or web address]

## 2022-08-13 NOTE — Progress Notes (Signed)
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 Cyndia Bent under Destination Therapy criteria due to BMI and chronic kidney disease.    Pt sitting up in the chair this morning. Pt c/o pain in driveline/wound vac site.  Vital signs: Temp:  98.3 HR: 82 NSR Doppler: 100 Auto cuff:  119/108 (113) O2 Sat: 94% on RA Wt: 298>300.9>300.4 lbs   LVAD interrogation reveals:  Speed: 6200 Flow: 5.3 Power:  5.3 w PI: 2.3 Hct: 41   Alarms:  none Events:  none today; 4 yesterday   Fixed speed:  6200 Low speed limit: 5900   Drive Line:  Wound vac in place suction at -125. No alarms overnight. There is approx 150cc of sanguineous drainage in the canister.  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      Tanda Rockers RN [place] Coordinator  Office: [phone]  24/7 Pager: [phone]

## 2022-08-13 NOTE — Progress Notes (Addendum)
Advanced Heart Failure VAD Team Note  PCP-Cardiologist: Glori Bickers, MD   Subjective:    11/6: Admitted w/ DL infection. CT with diffuse soft-tissue involvement but no overt abscess. Wound Cx growing few staph aureus. BCx NGTD.  11/8: s/p extensive debridement + wound vac. Wound Cx from OR NGTD  Remains on abx, ID narrowed to cefazolin. AF. WBC nl, 9.1   MAPs 90s  On heparin gtt, INR 1.2   C/w pain. No fever or chills.   LVAD INTERROGATION:  HeartMate III LVAD:   Flow 5.3  liters/min, speed 6200, power 5.3, PI 2.3. 2 PI events.    Objective:    Vital Signs:   Temp:  [98 F (36.7 C)-98.3 F (36.8 C)] 98.3 F (36.8 C) (11/09 0313) Pulse Rate:  [67-87] 72 (11/09 0400) Resp:  [18] 18 (11/09 0400) BP: (79-125)/(47-100) 100/88 (11/09 0600) SpO2:  [93 %-99 %] 98 % (11/09 0400) Weight:  [136.3 kg] 136.3 kg (11/09 0500) Last BM Date : 08/11/22 Mean arterial Pressure 90s  Intake/Output:   Intake/Output Summary (Last 24 hours) at 08/13/2022 0737 Last data filed at 08/13/2022 0600 Gross per 24 hour  Intake 2060.57 ml  Output 1200 ml  Net 860.57 ml     Physical Exam    General:  Well appearing, sitting up in chair. Laying in bed No resp difficulty HEENT: normal Neck: supple. JVD not elevated . Carotids 2+ bilat; no bruits. No lymphadenopathy or thyromegaly appreciated. Cor: Mechanical heart sounds with LVAD hum present. Lungs: CTAB  Abdomen: soft, nontender, nondistended. No hepatosplenomegaly. No bruits or masses. Good bowel sounds. Driveline: C/D/I; securement device intact and driveline incorporated, + wound vac Extremities: no cyanosis, clubbing, rash, No edema Neuro: alert & orientedx3, cranial nerves grossly intact. moves all 4 extremities w/o difficulty. Affect pleasant   Telemetry   NSR 70s  EKG    No new EKG to review   Labs   Basic Metabolic Panel: Recent Labs  Lab 08/10/22 1702 08/11/22 0655 08/11/22 0656 08/12/22 0436  NA 138  --  138  138  K 4.0  --  3.6 4.4  CL 108  --  108 109  CO2 21*  --  21* 21*  GLUCOSE 101*  --  110* 95  BUN 17  --  17 17  CREATININE 1.41*  --  1.30* 1.24  CALCIUM 9.5  --  9.0 8.8*  MG  --  1.4*  --  2.1    Liver Function Tests: Recent Labs  Lab 08/10/22 1702  AST 19  ALT 16  ALKPHOS 41  BILITOT 1.1  PROT 6.9  ALBUMIN 3.4*   No results for input(s): "LIPASE", "AMYLASE" in the last 168 hours. No results for input(s): "AMMONIA" in the last 168 hours.  CBC: Recent Labs  Lab 08/10/22 1702 08/11/22 0656 08/12/22 0436  WBC 11.9* 11.5* 10.3  HGB 13.9 13.6 13.6  HCT 41.9 39.0 41.9  MCV 89.7 88.0 91.3  PLT 218 186 188    INR: Recent Labs  Lab 08/10/22 1702 08/11/22 0656 08/12/22 0436 08/13/22 0659  INR 1.6* 1.4* 1.2 1.2    Other results: EKG:    Imaging   No results found.   Medications:     Scheduled Medications:  allopurinol  100 mg Oral Daily   amiodarone  200 mg Oral Daily   amLODipine  10 mg Oral Daily   Chlorhexidine Gluconate Cloth  6 each Topical Daily   colchicine  0.6 mg Oral Daily   doxazosin  1 mg Oral Daily   gabapentin  300 mg Oral TID   insulin aspart protamine- aspart  34 Units Subcutaneous BID WC   isosorbide-hydrALAZINE  2 tablet Oral TID   metFORMIN  1,000 mg Oral BID WC   nutrition supplement (JUVEN)  1 packet Oral BID BM   pantoprazole  40 mg Oral Daily   sacubitril-valsartan  1 tablet Oral BID   sertraline  150 mg Oral Daily   spironolactone  25 mg Oral Daily   traZODone  100 mg Oral QHS    Infusions:  sodium chloride 10 mL/hr at 08/13/22 0600    ceFAZolin (ANCEF) IV 2 g (08/13/22 0605)   heparin 500 Units/hr (08/13/22 0600)    PRN Medications: sodium chloride, acetaminophen, acetaminophen, morphine injection, naloxone, mouth rinse, oxyCODONE, traMADol   Patient Profile   46 y.o. male with a history of  poorly controlled HTN, R renal cell carcinoma s/p nephrectomy, CKD IIIa, DM2, OSA, gout, morbid obesity and systolic HF  due to NICM. 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    Assessment/Plan:     1. DL infection/ Abdominal Wall Cellulitis  - prior DL infection 3/10, wound cx + rare staph aureus, resolved w/ outpatient abx  - now w/ recurrent infection  - wound Cx + staph aureus, blood cx NGTD - CT with diffuse soft-tissue involvement but no overt abscess - ID following, abx narrowed to cefazolin  - s/p debridement + wound vac 11/8. Plan return to OR 11/14   2.  Chronic systolic CHF  - Nonischemic CM, end-stage  - EF 2012; 20-25%  - Echo  8/22: EF < 20%, severe LV dilation, moderate RV dysfunction.   - RHC (8/22): well compensated hemodynamics.  - s/p S-ICD  - S/p HM3 LVAD 10/17 (DT)  - Doing great NYHA I. Volume status looks good - MAPs 90s  - Continue Bidil 2 tabs tid  - Continue spironolactone 25 mg daily  - Continue entresto to 97/130 bid  - Stressed need to continue weight loss efforts (see below). Continue Ozempic. Once BMI closer to 35 will refer to [facility] for begin transplant evaluation (Body mass index is 46.64 kg/m.)    3. VAD  - s/p HM-3 placement 07/21/21  - VAD interrogated personally. Parameters stable.  - Hold Coumadin for debridgement of DL. Continue heparin gtt - LDH ok     4 Atrial fibrillation: Paroxysmal  - Had severe AF with RVR 7/22 s/p DC-CV x 2  - Seen by EP. Not a candidate for ablation due to body habitus  (could consider AVN ablation and CRT, if needed)  - Had post-op AF with RVR post VAD  - 10/23 S/P DC-CV with restoration of NSR.  - Remains in NSR Continue amio 200 daily  - Hold coumadin per above. Continue heparin gtt    5. HTN  - MAPs 90s, in setting of pain  - continue home regimen - pain control   6. CKD stage 3B  - Has solitary kidney.   - Baseline creatinine is 1.5-1.8.  - SCr ok, 1.24    7. OSA  - Continue CPAP    8. DM2  - On Jardiance.  - Hgb A1c 9.4. -> 5.9  - PCP following    9.  Polyarticular gout  - quiescent    10. Depression/anxiety  - Continue Zoloft '150mg'$  daily    11. Morbid obesity  - Body mass index is 46.64 kg/m.  -  Weight coming down  - Hold GLP1RA for OR (last dose 11/2)   I reviewed the LVAD parameters from today, and compared the results to the patient's prior recorded data.  No programming changes were made.  The LVAD is functioning within specified parameters.  The patient performs LVAD self-test daily.  LVAD interrogation was negative for any significant power changes, alarms or PI events/speed drops.  LVAD equipment check completed and is in good working order.  Back-up equipment present.   LVAD education done on emergency procedures and precautions and reviewed exit site care.  Length of Stay: [address] Ladoris Gene 08/13/2022, 7:37 AM  VAD Team --- VAD ISSUES ONLY--- Pager [phone] (7am - 7am)  Advanced Heart Failure Team  Pager [phone] (M-F; 7a - 5p)  Please contact [facility] for night-coverage after hours (5p -7a ) and weekends on [email or web address]  Patient seen and examined with the above-signed Advanced Practice Provider and/or Housestaff. I personally reviewed laboratory data, imaging studies and relevant notes. I independently examined the patient and formulated the important aspects of the plan. I have edited the note to reflect any of my changes or salient points. I have personally discussed the plan with the patient and/or family.  S/p extensive I&D ov DL site yesterday. Wound vac now in place. On IV abx. Still with pain at site.   Heparin held overnight due to site bleeding but now resolved and heparin restarted.   MAPs ok.   Wound cx still NGTD  General:  NAD.  HEENT: normal  Neck: supple. JVP not elevated.   Cor: LVAD hum.  Lungs: Clear. Abdomen: obese soft, + tender, non-distended. No hepatosplenomegaly. No bruits or masses. Good bowel sounds. Large wound vac in place with good suction. No bleeding Anchor in place.   Extremities: no cyanosis, clubbing, rash. Warm no edema  Neuro: alert & oriented x 3. No focal deficits. Moves all 4 without problem   Continue wound vac. Continue IV abx. Await cx. Continue heparin. Need to mobilize him. Limit narcotics.  VAD interrogated personally. Parameters stable.  Wound vac change next week.   Glori Bickers, MD  3:11 PM

## 2022-08-13 NOTE — TOC Initial Note (Signed)
Transition of Care [facility]) - Initial/Assessment Note    Patient Details  Name: Jeffrey [name] MRN: [id number] Date of Birth: Jan 22, 1976  [facility] of [facility]) CM/SW Contact:    Erenest Rasher, RN Phone Number: [phone] 08/13/2022, 4:55 PM  Clinical Narrative:                  HF TOC CM spoke to pt at bedside. Pt agreeable to [facility] for Home IV abx. Pt will need wound vac form home. Pt reports he has scale to do daily weights.     Expected Discharge Plan: [place] Barriers to Discharge: Continued Medical Work up   Patient Goals and CMS Choice Patient states their goals for this hospitalization and ongoing recovery are:: wants to get back to baseline CMS Medicare.gov Compare Post Acute Care list provided to:: Patient Choice offered to / list presented to : Patient  Expected Discharge Plan and Services Expected Discharge Plan: [place]   Discharge Planning Services: CM Consult Post Acute Care Choice: [place] arrangements for the past 2 months: [place] Arranged: RN HH Agency: [facility] Date HH Agency Contacted: 08/13/22 Time [place]: 1654 Representative spoke with at [place]: Carolynn Sayers RN  Prior Living Arrangements/Services Living arrangements for the past 2 months: [place] with:: Spouse Patient language and need for interpreter reviewed:: Yes Do you feel safe going back to the place where you live?: Yes      Need for Family Participation in Patient Care: Yes (Comment) Care giver support system in place?: Yes (comment) Current home services: DME (Scale, Rolling walker with seat, cane RW, CPAP) Criminal Activity/Legal Involvement Pertinent to Current Situation/Hospitalization: No - Comment as needed  Activities of Daily Living Home Assistive Devices/Equipment: None ADL Screening (condition at time of admission) Patient's cognitive  ability adequate to safely complete daily activities?: Yes Is the patient deaf or have difficulty hearing?: No Does the patient have difficulty seeing, even when wearing glasses/contacts?: No Does the patient have difficulty concentrating, remembering, or making decisions?: No Patient able to express need for assistance with ADLs?: Yes Does the patient have difficulty dressing or bathing?: No Independently performs ADLs?: Yes (appropriate for developmental age) Does the patient have difficulty walking or climbing stairs?: No Weakness of Legs: None Weakness of Arms/Hands: None  Permission Sought/Granted Permission sought to share information with : Case Manager, Family Supports, PCP Permission granted to share information with : Yes, Verbal Permission Granted  Share Information with NAME: [name]  Permission granted to share info w AGENCY: [place] granted to share info w Relationship: wife  Permission granted to share info w Contact Information: Eduardo Osier  Emotional Assessment Appearance:: Appears stated age Attitude/Demeanor/Rapport: Engaged Affect (typically observed): Accepting Orientation: : Oriented to Self, Oriented to Place, Oriented to  Time, Oriented to Situation   Psych Involvement: No (comment)  Admission diagnosis:  Infection associated with driveline of left ventricular assist device (LVAD) ([place]) [Z12.7XXA] Patient Active Problem List   Diagnosis Date Noted   Infection associated with driveline of left ventricular assist device (LVAD) ([place]) 08/10/2022   Presence of left ventricular assist device (LVAD) ([place]) 07/25/2021   Acute on chronic systolic CHF (congestive heart failure) ([place]) 07/18/2021   Cardiogenic shock ([place])  Atrial fibrillation with RVR ([place]) 04/30/2021   Acute hypoxemic respiratory failure (HCC)    Right wrist pain 04/16/2021   Absolute anemia    Rectal bleeding    Iron deficiency 12/25/2020   Long term current use of  anticoagulant - apixaban 12/25/2020   Paroxysmal atrial fibrillation (HCC)    Lactic acidosis 11/22/2020   Hypotension 11/22/2020   Aspiration into airway    ICD (implantable cardioverter-defibrillator) battery depletion 05/15/2020   Endotracheal tube present    Respiratory failure ([place])    Acute encephalopathy    Acute pulmonary edema (HCC)    NICM (nonischemic cardiomyopathy) ([place]) 04/24/2020   S/P vasectomy 12/20/2019   Anxiety 12/20/2019   Hemoglobin A1C between 7% and 9% indicating borderline diabetic control ([place]) 12/20/2019   Seasonal allergies 12/20/2019   Insomnia 12/20/2019   Vasectomy evaluation 06/20/2019   Visual problems 06/20/2019   Blurry vision, bilateral 06/20/2019   Hyperglycemia 02/13/2019   Hyperosmolar (nonketotic) coma ([place]) 01/19/2019   AICD (automatic cardioverter/defibrillator) present 01/19/2019   Hyperlipidemia 12/07/2018   Follow-up exam 11/22/2018   Class 3 severe obesity due to excess calories with serious comorbidity and body mass index (BMI) of 45.0 to 49.9 in adult ([place]) 11/22/2018   Dizziness 11/22/2018   Lingular pneumonia 08/04/2018   Ventricular tachycardia ([place]) 12/30/2017   PAF (paroxysmal atrial fibrillation) ([place])    Dilated cardiomyopathy ([place])    Pollen allergy 02/17/2017   Dyslipidemia 02/17/2017   Hematochezia 11/08/2015   CKD (chronic kidney disease), stage III ([place]) 06/20/2015   Cardiac defibrillator in situ    Chronic systolic CHF (congestive heart failure) ([place]) 03/11/2015   Gouty arthritis 03/11/2015   Chest pain 12/28/2014   Acute renal failure superimposed on stage 3 chronic kidney disease ([place]) 04/08/2014   Aspirin allergy 04/08/2014   Renal cell carcinoma ([place])    Gout attack 11/10/2011   Morbid obesity ([place]) 10/23/2011   OSA (obstructive sleep apnea) 10/23/2011   Type 2 diabetes mellitus with stage 3 chronic kidney disease, with long-term current use of insulin ([place]) 10/22/2011   Hyperlipidemia associated with type 2  diabetes mellitus ([place]) 12/25/2010   Hypertension associated with diabetes ([place]) 12/25/2010   PCP:  Teena Dunk, NP Pharmacy:   [facility] [address], [place] [address] Phone: [phone] Fax: [phone]  [id number] [place] [place]Phone: [phone]: [phone]    Social Determinants of Health (SDOH) Interventions    Readmission Risk Interventions     No data to display

## 2022-08-13 NOTE — Progress Notes (Signed)
1 Day Post-Op Procedure(s) (LRB): ABDOMINAL WOUND DEBRIDEMENT (N/A) APPLICATION OF WOUND VAC (N/A) Subjective: Patient with moderate surgical pain after abdominal wall debridement yesterday. Significant amount of serosanguineous fluid from the wound VAC due to considerable abdominal wall edema.  Remains tender to abdominal palpation but this is somewhat improved.  OR wound cultures still are pending. Continuing on IV vancomycin Low-dose IV heparin started, discussed with pharmacy  Objective: Vital signs in last 24 hours: Temp:  [98.1 F (36.7 C)-98.3 F (36.8 C)] 98.3 F (36.8 C) (11/09 0313) Pulse Rate:  [72-132] 132 (11/09 1109) Cardiac Rhythm: Normal sinus rhythm (11/09 0822) Resp:  [18] 18 (11/09 0822) BP: (80-149)/(51-119) 80/64 (11/09 1642) SpO2:  [94 %-99 %] 95 % (11/09 1109) Weight:  [136.3 kg] 136.3 kg (11/09 0500)  Hemodynamic parameters for last 24 hours:  Afebrile  Intake/Output from previous day: 11/08 0701 - 11/09 0700 In: 2170.5 [P.O.:720; I.V.:900.5; IV Piggyback:550] Out: 1200 [Urine:1025; Drains:175] Intake/Output this shift: Total I/O In: 949.3 [P.O.:720; I.V.:129.2; IV Piggyback:100.1] Out: 875 [Urine:825; Drains:50]       Exam    General- alert and comfortable    Neck- no JVD, no cervical adenopathy palpable, no carotid bruit   Lungs- clear without rales, wheezes   Cor- regular rate and rhythm, no murmur , gallop.  Normal VAD hum   Abdomen- soft, moderate mid abdominal tenderness   Extremities - warm, non-tender, minimal edema   Neuro- oriented, appropriate, no focal weakness   Lab Results: Recent Labs    08/12/22 0436 08/13/22 0659  WBC 10.3 9.1  HGB 13.6 13.0  HCT 41.9 41.2  PLT 188 230   BMET:  Recent Labs    08/12/22 0436 08/13/22 0659  NA 138 137  K 4.4 4.7  CL 109 107  CO2 21* 25  GLUCOSE 95 87  BUN 17 16  CREATININE 1.24 1.31*  CALCIUM 8.8* 8.9    PT/INR:  Recent Labs    08/13/22 0659  LABPROT 14.8  INR 1.2    ABG    Component Value Date/Time   PHART 7.368 07/25/2021 0436   HCO3 28.9 (H) 07/25/2021 0436   TCO2 30 07/25/2021 0436   ACIDBASEDEF 1.0 07/24/2021 0449   O2SAT 73.7 08/05/2021 0515   CBG (last 3)  Recent Labs    08/12/22 2132 08/13/22 0616 08/13/22 1134  GLUCAP 161* 98 54*    Assessment/Plan: S/P Procedure(s) (LRB): ABDOMINAL WOUND DEBRIDEMENT (N/A) APPLICATION OF WOUND VAC (N/A) Continue IV antibiotics and wound VAC therapy for 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 Hold Coumadin but continue heparin and follow wound VAC output. He will need more debridement and wound VAC change early next week probably Tuesday, November 14   LOS: 3 days    Dahlia Byes 08/13/2022

## 2022-08-13 NOTE — Inpatient Diabetes Management (Signed)
Inpatient Diabetes Program Recommendations  AACE/ADA: New Consensus Statement on Inpatient Glycemic Control (2015)  Target Ranges:  Prepandial:   less than 140 mg/dL      Peak postprandial:   less than 180 mg/dL (1-2 hours)      Critically ill patients:  140 - 180 mg/dL   Lab Results  Component Value Date   GLUCAP 54 (L) 08/13/2022   HGBA1C 5.3 03/16/2022    Review of Glycemic Control  Latest Reference Range & Units 08/13/22 06:16 08/13/22 11:34  Glucose-Capillary 70 - 99 mg/dL 98 54 (L)  (L): Data is abnormally low  Diabetes history: DM2 Outpatient Diabetes medications: 70/30 34 units BID, Metformin 1000 mg BID, Ozempic 2 mg weekly Current orders for Inpatient glycemic control: 70/30 34 units BID, Metformin 1000 mg BID  Inpatient Diabetes Program Recommendations:    Hypoglycemia at 11:30 today.  Please consider:  70/30 30 units BID Novolog 0-9 units TID   Will continue to follow while inpatient.  Thank you, Reche Dixon, MSN, [facility] Diabetes Coordinator Inpatient Diabetes Program [phone] (team pager from 8a-5p)

## 2022-08-13 NOTE — Anesthesia Postprocedure Evaluation (Signed)
Anesthesia Post Note  Patient: [name]  Procedure(s) Performed: ABDOMINAL WOUND DEBRIDEMENT (Abdomen) APPLICATION OF WOUND VAC     Patient location during evaluation: ICU Anesthesia Type: General Level of consciousness: awake and alert Pain management: pain level controlled Vital Signs Assessment: post-procedure vital signs reviewed and stable Respiratory status: spontaneous breathing, nonlabored ventilation and respiratory function stable Cardiovascular status: blood pressure returned to baseline and stable Postop Assessment: no apparent nausea or vomiting Anesthetic complications: no   No notable events documented.  Last Vitals:  Vitals:   08/13/22 0822 08/13/22 0900  BP: (!) 149/119 (!) 119/108  Pulse:    Resp: 18   Temp:    SpO2: 94%     Last Pain:  Vitals:   08/13/22 0849  TempSrc:   PainSc: Asleep                 Avangeline Stockburger

## 2022-08-14 ENCOUNTER — Inpatient Hospital Stay: Payer: Self-pay

## 2022-08-14 DIAGNOSIS — T827XXA Infection and inflammatory reaction due to other cardiac and vascular devices, implants and grafts, initial encounter: Secondary | ICD-10-CM | POA: Diagnosis not present

## 2022-08-14 LAB — CBC
HCT: 38.2 % — ABNORMAL LOW (ref 39.0–52.0)
Hemoglobin: 12.7 g/dL — ABNORMAL LOW (ref 13.0–17.0)
MCH: 30.5 pg (ref 26.0–34.0)
MCHC: 33.2 g/dL (ref 30.0–36.0)
MCV: 91.6 fL (ref 80.0–100.0)
Platelets: 230 10*3/uL (ref 150–400)
RBC: 4.17 MIL/uL — ABNORMAL LOW (ref 4.22–5.81)
RDW: 14.7 % (ref 11.5–15.5)
WBC: 7.1 10*3/uL (ref 4.0–10.5)
nRBC: 0 % (ref 0.0–0.2)

## 2022-08-14 LAB — PROTIME-INR
INR: 1.1 (ref 0.8–1.2)
Prothrombin Time: 13.6 seconds (ref 11.4–15.2)

## 2022-08-14 LAB — GLUCOSE, CAPILLARY
Glucose-Capillary: 90 mg/dL (ref 70–99)
Glucose-Capillary: 103 mg/dL — ABNORMAL HIGH (ref 70–99)
Glucose-Capillary: 98 mg/dL (ref 70–99)
Glucose-Capillary: 102 mg/dL — ABNORMAL HIGH (ref 70–99)

## 2022-08-14 LAB — LACTATE DEHYDROGENASE: LDH: 126 U/L (ref 98–192)

## 2022-08-14 LAB — BASIC METABOLIC PANEL
Anion gap: 3 — ABNORMAL LOW (ref 5–15)
BUN: 20 mg/dL (ref 6–20)
CO2: 25 mmol/L (ref 22–32)
Calcium: 9.1 mg/dL (ref 8.9–10.3)
Chloride: 109 mmol/L (ref 98–111)
Creatinine, Ser: 1.19 mg/dL (ref 0.61–1.24)
GFR, Estimated: >60 mL/min (ref >60)
Glucose, Bld: 92 mg/dL (ref 70–99)
Potassium: 4.9 mmol/L (ref 3.5–5.1)
Sodium: 137 mmol/L (ref 135–145)

## 2022-08-14 LAB — HEPARIN LEVEL (UNFRACTIONATED): Heparin Unfractionated: <0.1 IU/mL — ABNORMAL LOW (ref 0.30–0.70)

## 2022-08-14 MED ORDER — ADULT MULTIVITAMIN W/MINERALS CH
1.0000 | ORAL_TABLET | Freq: Every day | ORAL | Status: DC
  Administered 2022-08-14 – 2022-08-26 (×13): 1 via ORAL
  Filled 2022-08-14 (×13): qty 1

## 2022-08-14 NOTE — Progress Notes (Signed)
[facility] for Heparin Indication:  VAD  Allergies  Allergen Reactions   Aspirin Shortness Of Breath, Itching and Rash     Burning sensation (Patient reports he tolerates other NSAIDS)    Bee Venom Hives and Swelling   Lisinopril Cough   Shrimp (Diagnostic) Rash    Patient Measurements: Height: '5\' 7"'$  (170.2 cm) Weight: (!) 136.3 kg (300 lb 7.8 oz) IBW/kg (Calculated) : 66.1 Heparin Dosing Weight: 98 kg  Vital Signs: Temp: 98.5 F (36.9 C) (11/10 0800) Temp Source: Oral (11/10 0800) BP: 116/103 (11/10 0800) Pulse Rate: 85 (11/10 0800)  Labs: Recent Labs    08/12/22 0436 08/13/22 0659 08/14/22 0427  HGB 13.6 13.0 12.7*  HCT 41.9 41.2 38.2*  PLT 188 230 230  LABPROT 15.2 14.8 13.6  INR 1.2 1.2 1.1  HEPARINUNFRC <0.10* <0.10* <0.10*  CREATININE 1.24 1.31* 1.19     Estimated Creatinine Clearance: 102.2 mL/min (by C-G formula based on SCr of 1.19 mg/dL).   Medical History: Past Medical History:  Diagnosis Date   Anxiety    Aspirin allergy    Childhood asthma    Chronic systolic CHF (congestive heart failure) ([place])    a. EF 20-25% in 2012. b. EF 45-50% in 10/2011 with nonischemic nuc - presumed NICM. c. 12/2014 Echo: Sev depressed LV fxn, sev dil LV, mild LVH, mild MR, sev dil LA, mildly reduced RV fxn.   CKD (chronic kidney disease) stage 2, GFR 60-89 ml/min    H/O vasectomy 12/2019   High cholesterol    Hypertension    Morbid obesity ([place])    Nephrolithiasis    OSA on CPAP    Paroxysmal atrial fibrillation ([place])    Presumed NICM    a. 04/2014 Myoview: EF 26%, glob HK, sev glob HK, ? prior infarct;  b. Never cathed 2/2 CKD.   Renal cell carcinoma ([place])    a. s/p Rt robotic assisted partial converted to radical nephrectomy on 01/2013.   Troponin level elevated    a. 04/2014, 12/2014: felt due to CHF.   Type II diabetes mellitus ([facility])    Ventricular tachycardia ([facility])    a. appropriate ICD therapy 12/2017    Assessment: 46  yo male with a VAD HM3, presents to clinic w/ signs of  abdominal wall cellulitis and DL infection. + drainage from site. + low grade fever 99.8.  Patient is managed as an outpatient on Warfarin therapy. Pharmacy consulted to start a heparin infusion when INR < 2.  INR 1.2.  Coumadin on hold for ongoing debridement. Heparin level undetectable this morning as expected on low dose heparin drip 500 uts/hr with no up titration.  Had slight bleeding  around surgical site post debridement but now but resolved .  CBC stable  Further debridement in OR planned next week - will hold warfarin until wound more stable.  PTA Warfarin - 8 mg daily, except 4 mg on Tues and Sat.   Goal of Therapy:  Heparin level < 0.3 INR 2-2.5 post debridement  Monitor platelets by anticoagulation protocol: Yes   Plan:  Continue IV heparin at low rate of 500 units/hr Daily heparin level and CBC.   Bonnita Nasuti Pharm.D. CPP, BCPS Clinical Pharmacist [phone] 08/14/2022 9:46 AM     [facility] pharmacy phone numbers are listed on [email or web address]

## 2022-08-14 NOTE — TOC CM/SW Note (Signed)
HF TOC CM faxed wound vac order to [employer]. Spoke to [employer] rep and order received. [place], Heart Failure TOC CM [phone]

## 2022-08-14 NOTE — Progress Notes (Signed)
Advanced Heart Failure VAD Team Note  PCP-Cardiologist: Glori Bickers, MD   Subjective:    11/6: Admitted w/ DL infection. CT with diffuse soft-tissue involvement but no overt abscess.  Wound Cx growing few staph aureus. BCx NGTD.  11/8: s/p extensive debridement + wound vac. Wound Cx from OR NGTD  Remains on abx, ID narrowed to cefazolin.   Maps 90s  On heparin gtt, INR 1.2  Complaining of pain.   LVAD INTERROGATION:  HeartMate III LVAD:   Flow 5.2  liters/min, speed 6200, power 5.3, PI 2.5 .  Objective:    Vital Signs:   Temp:  [98.3 F (36.8 C)-98.5 F (36.9 C)] 98.3 F (36.8 C) (11/10 0630) Pulse Rate:  [74-132] 74 (11/10 0438) Resp:  [16-18] 16 (11/10 0438) BP: (80-149)/(64-119) 104/93 (11/10 0438) SpO2:  [94 %-96 %] 96 % (11/10 0438) Last BM Date : 08/13/22 Mean arterial Pressure 90s  Intake/Output:   Intake/Output Summary (Last 24 hours) at 08/14/2022 0821 Last data filed at 08/14/2022 0600 Gross per 24 hour  Intake 1238.48 ml  Output 1945 ml  Net -706.52 ml     Physical Exam   Physical Exam: GENERAL: No acute distress.Sitting in the chair HEENT: normal  NECK: Supple, JVP flat  .  2+ bilaterally, no bruits.  No lymphadenopathy or thyromegaly appreciated.   CARDIAC:  Mechanical heart sounds with LVAD hum present.  LUNGS:  Clear to auscultation bilaterally.  ABDOMEN:  Soft, round, nontender, positive bowel sounds x4.     LVAD exit site: VAC dressing in place  EXTREMITIES:  Warm and dry, no cyanosis, clubbing, rash or edema  NEUROLOGIC:  Alert and oriented x 3.    No aphasia.  No dysarthria.  Affect pleasant.     Telemetry   NSR 70-80s   EKG    No new EKG to review   Labs   Basic Metabolic Panel: Recent Labs  Lab 08/10/22 1702 08/11/22 0655 08/11/22 0656 08/12/22 0436 08/13/22 0659 08/14/22 0427  NA 138  --  138 138 137 137  K 4.0  --  3.6 4.4 4.7 4.9  CL 108  --  108 109 107 109  CO2 21*  --  21* 21* 25 25  GLUCOSE 101*  --   110* 95 87 92  BUN 17  --  '17 17 16 20  '$ CREATININE 1.41*  --  1.30* 1.24 1.31* 1.19  CALCIUM 9.5  --  9.0 8.8* 8.9 9.1  MG  --  1.4*  --  2.1  --   --     Liver Function Tests: Recent Labs  Lab 08/10/22 1702  AST 19  ALT 16  ALKPHOS 41  BILITOT 1.1  PROT 6.9  ALBUMIN 3.4*   No results for input(s): "LIPASE", "AMYLASE" in the last 168 hours. No results for input(s): "AMMONIA" in the last 168 hours.  CBC: Recent Labs  Lab 08/10/22 1702 08/11/22 0656 08/12/22 0436 08/13/22 0659 08/14/22 0427  WBC 11.9* 11.5* 10.3 9.1 7.1  HGB 13.9 13.6 13.6 13.0 12.7*  HCT 41.9 39.0 41.9 41.2 38.2*  MCV 89.7 88.0 91.3 93.4 91.6  PLT 218 186 188 230 230    INR: Recent Labs  Lab 08/10/22 1702 08/11/22 0656 08/12/22 0436 08/13/22 0659 08/14/22 0427  INR 1.6* 1.4* 1.2 1.2 1.1    Other results: EKG:    Imaging   [place] EKG SITE RITE  Result Date: 08/14/2022 If Site Rite image not attached, placement could not be confirmed due to  current cardiac rhythm.    Medications:     Scheduled Medications:  allopurinol  100 mg Oral Daily   amiodarone  200 mg Oral Daily   amLODipine  10 mg Oral Daily   Chlorhexidine Gluconate Cloth  6 each Topical Daily   colchicine  0.6 mg Oral Daily   doxazosin  1 mg Oral Daily   gabapentin  300 mg Oral TID   insulin aspart  0-9 Units Subcutaneous TID WC   insulin aspart protamine- aspart  30 Units Subcutaneous BID WC   isosorbide-hydrALAZINE  2 tablet Oral TID   metFORMIN  1,000 mg Oral BID WC   nutrition supplement (JUVEN)  1 packet Oral BID BM   pantoprazole  40 mg Oral Daily   sacubitril-valsartan  1 tablet Oral BID   sertraline  150 mg Oral Daily   spironolactone  25 mg Oral Daily   traZODone  100 mg Oral QHS    Infusions:  sodium chloride 10 mL/hr at 08/14/22 0600    ceFAZolin (ANCEF) IV 2 g (08/14/22 9381)   heparin 500 Units/hr (08/14/22 0614)    PRN Medications: sodium chloride, acetaminophen, acetaminophen, morphine  injection, naloxone, mouth rinse, oxyCODONE, traMADol   Patient Profile   46 y.o. male with a history of  poorly controlled HTN, R renal cell carcinoma s/p nephrectomy, CKD IIIa, DM2, OSA, gout, morbid obesity and systolic HF due to NICM. 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    Assessment/Plan:     1. DL infection/ Abdominal Wall Cellulitis  - prior DL infection 3/10, wound cx + rare staph aureus, resolved w/ outpatient abx  - now w/ recurrent infection  - wound Cx + staph aureus, blood cx NGTD - CT with diffuse soft-tissue involvement but no overt abscess - ID following, abx narrowed to cefazolin  - s/p debridement + wound vac 11/8. Plan return to OR 11/14   2.  Chronic systolic CHF  - Nonischemic CM, end-stage  - EF 2012; 20-25%  - Echo  8/22: EF < 20%, severe LV dilation, moderate RV dysfunction.   - RHC (8/22): well compensated hemodynamics.  - s/p S-ICD  - S/p HM3 LVAD 10/17 (DT)  - From HF perspective appears stable.  - MAPs 90s  - Continue Bidil 2 tabs tid  - Continue spironolactone 25 mg daily  - Continue entresto to 97/130 bid  - Resume ozempic after discharge.    3. VAD  - s/p HM-3 placement 07/21/21  - VAD interrogated personally. Parameters stable.  - Hold Coumadin for debridgement of DL. Continue heparin gtt - LDH Stable.  - INR 1.2. On heparin drip.    4 Atrial fibrillation: Paroxysmal  - Had severe AF with RVR 7/22 s/p DC-CV x 2  - Seen by EP. Not a candidate for ablation due to body habitus  (could consider AVN ablation and CRT, if needed)  - Had post-op AF with RVR post VAD  - 10/23 S/P DC-CV with restoration of NSR.  - In SR Continue amio 200 daily  - Hold coumadin per above. Continue heparin gtt    5. HTN  -  continue home regimen - pain control   6. CKD stage 3B  - Has solitary kidney.   - Baseline creatinine is 1.5-1.8.  - Stable today    7. OSA  - Continue CPAP    8. DM2  - On  Jardiance.  - Hgb A1c 9.4. -> 5.9  - PCP  following    9. Polyarticular gout  - quiescent    10. Depression/anxiety  - Continue Zoloft '150mg'$  daily    11. Morbid obesity  -Body mass index is 47.06 kg/m. - Hold GLP1RA for OR (last dose 11/2)  Transfer to [facility]    I reviewed the LVAD parameters from today, and compared the results to the patient's prior recorded data.  No programming changes were made.  The LVAD is functioning within specified parameters.  The patient performs LVAD self-test daily.  LVAD interrogation was negative for any significant power changes, alarms or PI events/speed drops.  LVAD equipment check completed and is in good working order.  Back-up equipment present.   LVAD education done on emergency procedures and precautions and reviewed exit site care.  Length of Stay: 4  Darrick Grinder, NP 08/14/2022, 8:21 AM  VAD Team --- VAD ISSUES ONLY--- Pager [phone] (7am - 7am)  Advanced Heart Failure Team  Pager [phone] (M-F; 7a - 5p)  Please contact [facility] for night-coverage after hours (5p -7a ) and weekends on [email or web address]

## 2022-08-14 NOTE — Progress Notes (Addendum)
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 Cyndia Bent under Destination Therapy criteria due to BMI and chronic kidney disease.    Pt sitting up in the chair this morning. Pt c/o pain in driveline/wound vac site, but states PRN pain medications are working.   Plan to return to OR for further debridement/wound vac change Tuesday 11/14 with Dr Prescott Gum. Currently on Cefazolin 2g Q 8 hrs per ID.   Pt will need wound vac and home IV antibiotics at hospital discharge.   Vital signs: Temp:  98.5 HR: 85 NSR Doppler: 95 Auto cuff: 116/103 (108) O2 Sat: 94% on RA Wt: 298>300.9>300.4>300.9 lbs   LVAD interrogation reveals:  Speed: 6200 Flow: 5.1 Power:  5.2 w PI: 2.7 Hct: 41   Alarms:  none Events: none   Fixed speed:  6200 Low speed limit: 5900   Drive Line:  Wound vac in place suction at -125. No alarms overnight. There is approx 150cc of sanguineous drainage in the canister.  Labs:  LDH trend: 130>147>128>126   INR trend: 1.4>1.2>1.1  WBC: 11.5>10.9>9.1>7.1  Anticoagulation Plan: -INR Goal: 2.0 - 2.5  -ASA Dose: none due to allergy     Blood Products:     Device: - [employer] single ICD -Therapies: on per BS rep on 08/06/21     Infection:  08/10/22>>driveline cx -- FEW STAPHYLOCOCCUS AUREUS  08/10/22>>BC x 2 -- NGTD 08/12/22>> driveline cx OR -- NGTD  Drips:  Heparin 5 units/hr    Plan/Recommendations:  Call VAD Coordinator if any VAD equipment or drive line issues. Call VAD coordinator if any wound vac issues. Plan for wound vac change in OR with Dr Prescott Gum 08/18/22  [name] RN [place] Coordinator  Office: [phone]  24/7 Pager: [phone]

## 2022-08-14 NOTE — Progress Notes (Addendum)
Nutrition Follow-up  DOCUMENTATION CODES:   Obesity unspecified  INTERVENTION:   Continue Juven BID, each packet provides 80 calories, 8 grams of carbohydrate, 2.5  grams of protein (collagen), 7 grams of L-arginine and 7 grams of L-glutamine; supplement contains CaHMB, Vitamins C, E, B12 and Zinc to promote wound healing  Double protein portions at meal times  Add MVI with Minerals Daily  Plan to liberalize diet to Heart Healthy only for now; remove Carb Mod restriction. Pt eating but CBGs low to normal/acceptable level.   Appears that po intake has dropped post op, if remains 50% or less, recommend addition of Ensure supplement and/or further liberalization of diet  NUTRITION DIAGNOSIS:   Increased nutrient needs related to wound healing, acute illness as evidenced by estimated needs.  Being adderessed   GOAL:   Patient will meet greater than or equal to 90% of their needs  Addressed  MONITOR:   PO intake, Supplement acceptance, Labs, Weight trends, Skin  REASON FOR ASSESSMENT:   Rounds    ASSESSMENT:   46 yo male admitted with abdominal wall cellulitis and drive line infection. PMH includes LVAD HM III (07/21/2021), CKD stage 2, HTN, renal cell carcinoma with nephrectomy in 2014, CHF  11/08 Excisional debridement of VAD drive line tunnel infection with excision of infected tissues including skin, subcutaneous fat, abdominal fascia, wound VAC placement  Noted plan to return to OR for wound VAC change on 11/14 and likely more debridement.   Appetite remains good, pt eating 40-100% of meals. Pt is receiving double portion of protein (entree) at meals.   Pt receiving Juven BID; pt taking some but refuses at times. RD educated pt on importance of Juven for wound healing and reports he is agreeable to taking even though he does not like  Noted CBGs 54-118. Recommend removal of carb restriction for now  Weight stable  Labs:reviewed Meds: ss novolog, novolog 70/30,  glucophage  Diet Order:   Diet Order             Diet Heart Room service appropriate? Yes with Assist; Fluid consistency: Thin  Diet effective now                   EDUCATION NEEDS:   Education needs have been addressed  Skin:  Skin Assessment: Skin Integrity Issues: Skin Integrity Issues:: Wound VAC Wound Vac: drive line infection-debridment Other: Driveline infection: purulent drainage; cellulitis to abdomen  Last BM:  11/9  Height:   Ht Readings from Last 1 Encounters:  08/14/22 '5\' 7"'$  (1.702 m)    Weight:   Wt Readings from Last 1 Encounters:  08/14/22 (!) 136.5 kg    BMI:  Body mass index is 47.13 kg/m.  Estimated Nutritional Needs:   Kcal:  2000-2200 kcals  Protein:  115-130 g  Fluid:  1.8 L   Kerman Passey MS, RDN, LDN, CNSC Registered Dietitian 3 Clinical Nutrition RD Pager and On-Call Pager Number Located in [place]

## 2022-08-14 NOTE — Progress Notes (Signed)
2 Days Post-Op Procedure(s) (LRB): ABDOMINAL WOUND DEBRIDEMENT (N/A) APPLICATION OF WOUND VAC (N/A) Subjective: Up in chair, pain abdominal wall infection slowly improving.  Serosanguineous VAC drainage from abdominal wall edema/infected fluid continues.  Wound VAC sponge compressed.  Operative cultures still no growth.  Preoperative culture showed staph.  Antibiotics narrowed to IV Ancef. Patient will need at least 3 weeks of IV antibiotics.  He will need another return to the OR on Tuesday, November 14 for irrigation and wound VAC change probable more debridement.  Objective: Vital signs in last 24 hours: Temp:  [98.3 F (36.8 C)-98.5 F (36.9 C)] 98.5 F (36.9 C) (11/10 0800) Pulse Rate:  [74-132] 85 (11/10 0800) Cardiac Rhythm: Normal sinus rhythm (11/09 2000) Resp:  [16-18] 18 (11/10 0800) BP: (80-119)/(64-108) 116/103 (11/10 0800) SpO2:  [94 %-96 %] 94 % (11/10 0800)  Hemodynamic parameters for last 24 hours:  Afebrile  Intake/Output from previous day: 11/09 0701 - 11/10 0700 In: 1238.5 [P.O.:720; I.V.:318.3; IV Piggyback:200.1] Out: 5573 [UKGUR:4270; Drains:70] Intake/Output this shift: Total I/O In: 174.6 [P.O.:50; I.V.:24.6; IV Piggyback:100] Out: -        Exam    General- alert and comfortable    Neck- no JVD, no cervical adenopathy palpable, no carotid bruit   Lungs- clear without rales, wheezes   Cor- regular rate and rhythm, no murmur , gallop.  Normal VAD hum   Abdomen- soft, remains tender to palpation medial to wound VAC incision   Extremities - warm, non-tender, minimal edema   Neuro- oriented, appropriate, no focal weakness   Lab Results: Recent Labs    08/13/22 0659 08/14/22 0427  WBC 9.1 7.1  HGB 13.0 12.7*  HCT 41.2 38.2*  PLT 230 230   BMET:  Recent Labs    08/13/22 0659 08/14/22 0427  NA 137 137  K 4.7 4.9  CL 107 109  CO2 25 25  GLUCOSE 87 92  BUN 16 20  CREATININE 1.31* 1.19  CALCIUM 8.9 9.1    PT/INR:  Recent Labs     08/14/22 0427  LABPROT 13.6  INR 1.1   ABG    Component Value Date/Time   PHART 7.368 07/25/2021 0436   HCO3 28.9 (H) 07/25/2021 0436   TCO2 30 07/25/2021 0436   ACIDBASEDEF 1.0 07/24/2021 0449   O2SAT 73.7 08/05/2021 0515   CBG (last 3)  Recent Labs    08/13/22 1831 08/13/22 2100 08/14/22 0628  GLUCAP 100* 118* 90    Assessment/Plan: S/P Procedure(s) (LRB): ABDOMINAL WOUND DEBRIDEMENT (N/A) APPLICATION OF WOUND VAC (N/A) Continue wound VAC drainage of abdominal wall abscess antibiotics  Hold Coumadin as patient will need more debridement-next OR scheduled for November 14   LOS: 4 days    Dahlia Byes 08/14/2022

## 2022-08-15 DIAGNOSIS — T827XXA Infection and inflammatory reaction due to other cardiac and vascular devices, implants and grafts, initial encounter: Secondary | ICD-10-CM | POA: Diagnosis not present

## 2022-08-15 LAB — CBC
HCT: 37.3 % — ABNORMAL LOW (ref 39.0–52.0)
Hemoglobin: 12.3 g/dL — ABNORMAL LOW (ref 13.0–17.0)
MCH: 29.7 pg (ref 26.0–34.0)
MCHC: 33 g/dL (ref 30.0–36.0)
MCV: 90.1 fL (ref 80.0–100.0)
Platelets: 248 10*3/uL (ref 150–400)
RBC: 4.14 MIL/uL — ABNORMAL LOW (ref 4.22–5.81)
RDW: 14.5 % (ref 11.5–15.5)
WBC: 7 10*3/uL (ref 4.0–10.5)
nRBC: 0 % (ref 0.0–0.2)

## 2022-08-15 LAB — GLUCOSE, CAPILLARY
Glucose-Capillary: 86 mg/dL (ref 70–99)
Glucose-Capillary: 87 mg/dL (ref 70–99)
Glucose-Capillary: 94 mg/dL (ref 70–99)
Glucose-Capillary: 98 mg/dL (ref 70–99)

## 2022-08-15 LAB — BASIC METABOLIC PANEL
Anion gap: 5 (ref 5–15)
BUN: 19 mg/dL (ref 6–20)
CO2: 24 mmol/L (ref 22–32)
Calcium: 9.6 mg/dL (ref 8.9–10.3)
Chloride: 108 mmol/L (ref 98–111)
Creatinine, Ser: 1.32 mg/dL — ABNORMAL HIGH (ref 0.61–1.24)
GFR, Estimated: >60 mL/min (ref >60)
Glucose, Bld: 102 mg/dL — ABNORMAL HIGH (ref 70–99)
Potassium: 4.5 mmol/L (ref 3.5–5.1)
Sodium: 137 mmol/L (ref 135–145)

## 2022-08-15 LAB — CULTURE, BLOOD (SINGLE)
Culture: NO GROWTH
Culture: NO GROWTH
Special Requests: ADEQUATE

## 2022-08-15 LAB — LACTATE DEHYDROGENASE: LDH: 134 U/L (ref 98–192)

## 2022-08-15 LAB — HEPARIN LEVEL (UNFRACTIONATED): Heparin Unfractionated: <0.1 IU/mL — ABNORMAL LOW (ref 0.30–0.70)

## 2022-08-15 LAB — PROTIME-INR
INR: 1.1 (ref 0.8–1.2)
Prothrombin Time: 14.2 seconds (ref 11.4–15.2)

## 2022-08-15 MED ORDER — SODIUM CHLORIDE 0.9% FLUSH
10.0000 mL | Freq: Two times a day (BID) | INTRAVENOUS | Status: DC
  Administered 2022-08-15 – 2022-08-26 (×17): 10 mL

## 2022-08-15 MED ORDER — SODIUM CHLORIDE 0.9% FLUSH
10.0000 mL | INTRAVENOUS | Status: DC | PRN
  Administered 2022-08-26: 10 mL

## 2022-08-15 NOTE — Progress Notes (Signed)
Peripherally Inserted Central Catheter Placement  The IV Nurse has discussed with the patient and/or persons authorized to consent for the patient, the purpose of this procedure and the potential benefits and risks involved with this procedure.  The benefits include less needle sticks, lab draws from the catheter, and the patient may be discharged home with the catheter. Risks include, but not limited to, infection, bleeding, blood clot (thrombus formation), and puncture of an artery; nerve damage and irregular heartbeat and possibility to perform a PICC exchange if needed/ordered by physician.  Alternatives to this procedure were also discussed.  Bard Power PICC patient education guide, fact sheet on infection prevention and patient information card has been provided to patient /or left at bedside.    PICC Placement Documentation  PICC Single Lumen 08/15/22 Right Brachial 44 cm 2 cm (Active)  Indication for Insertion or Continuance of Line Home intravenous therapies (PICC only) 08/15/22 1553  Exposed Catheter (cm) 2 cm 08/15/22 1553  Site Assessment Clean, Dry, Intact 08/15/22 1553  Line Status Flushed;Saline locked;Blood return noted 08/15/22 1553  Dressing Type Transparent;Securing device 08/15/22 1553  Dressing Status Antimicrobial disc in place;Clean, Dry, Intact 08/15/22 1553  Safety Lock Not Applicable 34/28/76 8115  Line Care Connections checked and tightened 08/15/22 1553  Line Adjustment (NICU/IV Team Only) No 08/15/22 1553  Dressing Intervention New dressing 08/15/22 1553  Dressing Change Due 08/22/22 08/15/22 1553       Rolena Infante 08/15/2022, 3:54 PM

## 2022-08-15 NOTE — Progress Notes (Addendum)
Mobility Specialist: Progress Note   08/15/22 1235  Mobility  Activity Ambulated independently in hallway  Level of Assistance Independent after set-up  Assistive Device None  Distance Ambulated (ft) 400 ft  Activity Response Tolerated well  Mobility Referral Yes  $Mobility charge 1 Mobility   Pre-Mobility: 80 HR Post-Mobility: 81 HR  Pt received in the bed and agreeable to mobility. C/o abdominal pain during ambulation, no rating given. No c/o dizziness or SOB. Pt to the chair after session with call [place] in reach and RN present in the room.   [name] Mobility Specialist Please contact via SecureChat or Rehab office at [phone]

## 2022-08-15 NOTE — Progress Notes (Signed)
[facility] for Heparin Indication:  VAD  Allergies  Allergen Reactions   Aspirin Shortness Of Breath, Itching and Rash     Burning sensation (Patient reports he tolerates other NSAIDS)    Bee Venom Hives and Swelling   Lisinopril Cough   Shrimp (Diagnostic) Rash    Patient Measurements: Height: '5\' 7"'$  (170.2 cm) Weight: 134.3 kg (296 lb 1.2 oz) IBW/kg (Calculated) : 66.1 Heparin Dosing Weight: 98 kg  Vital Signs: Temp: 98 F (36.7 C) (11/11 0743) Temp Source: Oral (11/11 0743) BP: 134/104 (11/11 0743) Pulse Rate: 52 (11/10 2334)  Labs: Recent Labs    08/13/22 0659 08/14/22 0427 08/15/22 0016  HGB 13.0 12.7* 12.3*  HCT 41.2 38.2* 37.3*  PLT 230 230 248  LABPROT 14.8 13.6 14.2  INR 1.2 1.1 1.1  HEPARINUNFRC <0.10* <0.10* <0.10*  CREATININE 1.31* 1.19 1.32*     Estimated Creatinine Clearance: 91.4 mL/min (A) (by C-G formula based on SCr of 1.32 mg/dL (H)).   Medical History: Past Medical History:  Diagnosis Date   Anxiety    Aspirin allergy    Childhood asthma    Chronic systolic CHF (congestive heart failure) ([place])    a. EF 20-25% in 2012. b. EF 45-50% in 10/2011 with nonischemic nuc - presumed NICM. c. 12/2014 Echo: Sev depressed LV fxn, sev dil LV, mild LVH, mild MR, sev dil LA, mildly reduced RV fxn.   CKD (chronic kidney disease) stage 2, GFR 60-89 ml/min    H/O vasectomy 12/2019   High cholesterol    Hypertension    Morbid obesity ([place])    Nephrolithiasis    OSA on CPAP    Paroxysmal atrial fibrillation ([place])    Presumed NICM    a. 04/2014 Myoview: EF 26%, glob HK, sev glob HK, ? prior infarct;  b. Never cathed 2/2 CKD.   Renal cell carcinoma ([place])    a. s/p Rt robotic assisted partial converted to radical nephrectomy on 01/2013.   Troponin level elevated    a. 04/2014, 12/2014: felt due to CHF.   Type II diabetes mellitus (HCC)    Ventricular tachycardia (HCC)    a. appropriate ICD therapy 12/2017     Assessment: 46 yo male with a VAD HM3, presents to clinic w/ signs of  abdominal wall cellulitis and DL infection. + drainage from site. + low grade fever 99.8.  Patient is managed as an outpatient on Warfarin therapy. Pharmacy consulted to start a heparin infusion when INR < 2.  INR 1.1. Coumadin on hold for ongoing debridement. Heparin level undetectable this morning as expected on low dose heparin drip 500 units/hr with no up titration.  Had slight bleeding around surgical site post debridement but now but resolved . CBC stable, further debridement in OR planned next week - will hold warfarin until wound more stable.  PTA Warfarin - 8 mg daily, except 4 mg on Tues and Sat.  Goal of Therapy:  Heparin level < 0.3 INR 2-2.5 post debridement  Monitor platelets by anticoagulation protocol: Yes   Plan:  Continue IV heparin at low rate of 500 units/hr Daily heparin level and CBC.  Erin Hearing PharmD., BCPS Clinical Pharmacist 08/15/2022 8:57 AM

## 2022-08-15 NOTE — Progress Notes (Signed)
Pt's Wound Vac alarmed Air Leak around 9 PM, troubleshoot system, and could not find the leak.  Did reinforce the dressing around area thought the leak was coming from.  The leak continued.  Around 2:30 AM replaced the canister to see if this would solve the problem.  The air leak continued until about 0330, when the alarm stopped.

## 2022-08-15 NOTE — Plan of Care (Signed)
  Problem: Education: Goal: Patient will understand all VAD equipment and how it functions Outcome: Progressing Goal: Patient will be able to verbalize current INR target range and antiplatelet therapy for discharge home Outcome: Progressing   Problem: Cardiac: Goal: LVAD will function as expected and patient will experience no clinical alarms Outcome: Progressing   Problem: Education: Goal: Knowledge of General Education information will improve Description: Including pain rating scale, medication(s)/side effects and non-pharmacologic comfort measures Outcome: Progressing   Problem: Health Behavior/Discharge Planning: Goal: Ability to manage health-related needs will improve Outcome: Progressing   Problem: Clinical Measurements: Goal: Ability to maintain clinical measurements within normal limits will improve Outcome: Progressing Goal: Will remain free from infection Outcome: Progressing Goal: Diagnostic test results will improve Outcome: Progressing Goal: Respiratory complications will improve Outcome: Progressing Goal: Cardiovascular complication will be avoided Outcome: Progressing   Problem: Activity: Goal: Risk for activity intolerance will decrease Outcome: Progressing   Problem: Nutrition: Goal: Adequate nutrition will be maintained Outcome: Progressing   Problem: Coping: Goal: Level of anxiety will decrease Outcome: Progressing   Problem: Elimination: Goal: Will not experience complications related to bowel motility Outcome: Progressing Goal: Will not experience complications related to urinary retention Outcome: Progressing   Problem: Pain Managment: Goal: General experience of comfort will improve Outcome: Progressing   Problem: Safety: Goal: Ability to remain free from injury will improve Outcome: Progressing   Problem: Skin Integrity: Goal: Risk for impaired skin integrity will decrease Outcome: Progressing   Problem: Education: Goal: Ability  to describe self-care measures that may prevent or decrease complications (Diabetes Survival Skills Education) will improve Outcome: Progressing Goal: Individualized Educational Video(s) Outcome: Progressing   Problem: Coping: Goal: Ability to adjust to condition or change in health will improve Outcome: Progressing   Problem: Fluid Volume: Goal: Ability to maintain a balanced intake and output will improve Outcome: Progressing   Problem: Health Behavior/Discharge Planning: Goal: Ability to identify and utilize available resources and services will improve Outcome: Progressing Goal: Ability to manage health-related needs will improve Outcome: Progressing   Problem: Metabolic: Goal: Ability to maintain appropriate glucose levels will improve Outcome: Progressing   Problem: Nutritional: Goal: Maintenance of adequate nutrition will improve Outcome: Progressing Goal: Progress toward achieving an optimal weight will improve Outcome: Progressing   Problem: Skin Integrity: Goal: Risk for impaired skin integrity will decrease Outcome: Progressing   Problem: Tissue Perfusion: Goal: Adequacy of tissue perfusion will improve Outcome: Progressing

## 2022-08-15 NOTE — Progress Notes (Signed)
Advanced Heart Failure VAD Team Note  PCP-Cardiologist: Glori Bickers, MD   Subjective:    11/6: Admitted w/ DL infection. CT with diffuse soft-tissue involvement but no overt abscess.  Wound Cx growing few staph aureus. BCx NGTD.  11/8: s/p extensive debridement + wound vac. Wound Cx from OR NGTD  - Only reporting pain at debridement site, otherwise, no complaints.   LVAD INTERROGATION:  HeartMate III LVAD:   Flow 4.9 liters/min, speed 6200, power 5.2, PI 3.3, 6 PI events .  Objective:    Vital Signs:   Temp:  [98 F (36.7 C)-98.5 F (36.9 C)] 98 F (36.7 C) (11/11 0743) Pulse Rate:  [52-79] 52 (11/10 2334) Resp:  [18-20] 19 (11/11 0743) BP: (103-134)/(63-107) 134/104 (11/11 0743) SpO2:  [93 %-96 %] 96 % (11/11 0743) Weight:  [134.3 kg] 134.3 kg (11/11 0700) Last BM Date : 08/15/22 Mean arterial Pressure 90s  Intake/Output:   Intake/Output Summary (Last 24 hours) at 08/15/2022 1028 Last data filed at 08/15/2022 0000 Gross per 24 hour  Intake 443.19 ml  Output 950 ml  Net -506.81 ml      Physical Exam   Physical Exam: GENERAL: No acute distress.Sitting in the chair HEENT: normal  NECK: Supple, JVP <5 .  2+ bilaterally, no bruits.  No lymphadenopathy or thyromegaly appreciated.   CARDIAC:  Mechanical heart sounds with LVAD hum present.  LUNGS:  Clear to auscultation bilaterally.  ABDOMEN:  Soft, round, nontender, positive bowel sounds x4.     LVAD exit site: VAC dressing in place  EXTREMITIES:  Warm and dry, no cyanosis, clubbing, rash or edema  NEUROLOGIC:  Alert and oriented x 3.    No aphasia.  No dysarthria.  Affect pleasant.     Telemetry   NSR 80s  EKG    No new EKG to review   Labs   Basic Metabolic Panel: Recent Labs  Lab 08/11/22 0655 08/11/22 0656 08/12/22 0436 08/13/22 0659 08/14/22 0427 08/15/22 0016  NA  --  138 138 137 137 137  K  --  3.6 4.4 4.7 4.9 4.5  CL  --  108 109 107 109 108  CO2  --  21* 21* '25 25 24  '$ GLUCOSE  --   110* 95 87 92 102*  BUN  --  '17 17 16 20 19  '$ CREATININE  --  1.30* 1.24 1.31* 1.19 1.32*  CALCIUM  --  9.0 8.8* 8.9 9.1 9.6  MG 1.4*  --  2.1  --   --   --      Liver Function Tests: Recent Labs  Lab 08/10/22 1702  AST 19  ALT 16  ALKPHOS 41  BILITOT 1.1  PROT 6.9  ALBUMIN 3.4*    No results for input(s): "LIPASE", "AMYLASE" in the last 168 hours. No results for input(s): "AMMONIA" in the last 168 hours.  CBC: Recent Labs  Lab 08/11/22 0656 08/12/22 0436 08/13/22 0659 08/14/22 0427 08/15/22 0016  WBC 11.5* 10.3 9.1 7.1 7.0  HGB 13.6 13.6 13.0 12.7* 12.3*  HCT 39.0 41.9 41.2 38.2* 37.3*  MCV 88.0 91.3 93.4 91.6 90.1  PLT 186 188 230 230 248     INR: Recent Labs  Lab 08/11/22 0656 08/12/22 0436 08/13/22 0659 08/14/22 0427 08/15/22 0016  INR 1.4* 1.2 1.2 1.1 1.1     Other results: EKG:    Imaging   [place] EKG SITE RITE  Result Date: 08/14/2022 If Site Rite image not attached, placement could not be confirmed  due to current cardiac rhythm.  [place] EKG SITE RITE  Result Date: 08/14/2022 If Site Rite image not attached, placement could not be confirmed due to current cardiac rhythm.    Medications:     Scheduled Medications:  allopurinol  100 mg Oral Daily   amiodarone  200 mg Oral Daily   amLODipine  10 mg Oral Daily   Chlorhexidine Gluconate Cloth  6 each Topical Daily   colchicine  0.6 mg Oral Daily   doxazosin  1 mg Oral Daily   gabapentin  300 mg Oral TID   insulin aspart  0-9 Units Subcutaneous TID WC   insulin aspart protamine- aspart  30 Units Subcutaneous BID WC   isosorbide-hydrALAZINE  2 tablet Oral TID   metFORMIN  1,000 mg Oral BID WC   multivitamin with minerals  1 tablet Oral Daily   nutrition supplement (JUVEN)  1 packet Oral BID BM   pantoprazole  40 mg Oral Daily   sacubitril-valsartan  1 tablet Oral BID   sertraline  150 mg Oral Daily   spironolactone  25 mg Oral Daily   traZODone  100 mg Oral QHS    Infusions:  sodium  chloride 10 mL/hr at 08/14/22 2321    ceFAZolin (ANCEF) IV 2 g (08/15/22 0655)   heparin 500 Units/hr (08/15/22 0000)    PRN Medications: sodium chloride, acetaminophen, acetaminophen, morphine injection, naloxone, mouth rinse, oxyCODONE, traMADol   Patient Profile   46 y.o. male with a history of  poorly controlled HTN, R renal cell carcinoma s/p nephrectomy, CKD IIIa, DM2, OSA, gout, morbid obesity and systolic HF due to NICM. 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    Assessment/Plan:     1. DL infection/ Abdominal Wall Cellulitis  - prior DL infection 3/10, wound cx + rare staph aureus, resolved w/ outpatient abx  - now w/ recurrent infection  - wound Cx + staph aureus, blood cx NGTD - CT with diffuse soft-tissue involvement but no overt abscess - ID following, abx narrowed to cefazolin  - s/p debridement + wound vac 11/8. Plan return to OR 11/14 - Afebrile overnight, no complaints.   2.  Chronic systolic CHF  - Nonischemic CM, end-stage  - EF 2012; 20-25%  - Echo  8/22: EF < 20%, severe LV dilation, moderate RV dysfunction.   - RHC (8/22): well compensated hemodynamics.  - s/p S-ICD  - S/p HM3 LVAD 10/17 (DT)  - Continue Bidil 2 tabs tid  - Continue spironolactone 25 mg daily  - Continue entresto to 97/130 bid  - Resume ozempic after discharge.  - Rise in sCr overnight; on exam appears hypovolemic w/ JVP <5. Will hold off on any further diuretics.    3. VAD  - s/p HM-3 placement 07/21/21  - VAD interrogated personally. Parameters stable.  - Hold Coumadin for debridgement of DL. Continue heparin gtt - LDH Stable.  - INR 1.1. On heparin drip.    4 Atrial fibrillation: Paroxysmal  - Had severe AF with RVR 7/22 s/p DC-CV x 2  - Seen by EP. Not a candidate for ablation due to body habitus  (could consider AVN ablation and CRT, if needed)  - Had post-op AF with RVR post VAD  - 10/23 S/P DC-CV with restoration of NSR.   - In SR Continue amio 200 daily  - Hold coumadin per above. Continue heparin gtt    5. HTN  -  continue home regimen - pain  control   6. CKD stage 3B  - Has solitary kidney.   - Baseline creatinine is 1.5-1.8.  - sCr 1.32 today, likely elevated today due to hypovolemia. Holding off diuretics.    7. OSA  - Continue CPAP    8. DM2  - On Jardiance.  - Hgb A1c 9.4. -> 5.9  - PCP following    9. Polyarticular gout  - quiescent    10. Depression/anxiety  - Continue Zoloft '150mg'$  daily    11. Morbid obesity  -Body mass index is 46.37 kg/m. - Hold GLP1RA for OR (last dose 11/2)   I reviewed the LVAD parameters from today, and compared the results to the patient's prior recorded data.  No programming changes were made.  The LVAD is functioning within specified parameters.  The patient performs LVAD self-test daily.  LVAD interrogation was negative for any significant power changes, alarms or PI events/speed drops.  LVAD equipment check completed and is in good working order.  Back-up equipment present.   LVAD education done on emergency procedures and precautions and reviewed exit site care.  Length of Stay: [place], DO 08/15/2022, 10:28 AM  VAD Team --- VAD ISSUES ONLY--- Pager [phone] (7am - 7am)  Advanced Heart Failure Team  Pager [phone] (M-F; 7a - 5p)  Please contact [facility] for night-coverage after hours (5p -7a ) and weekends on [email or web address]

## 2022-08-16 DIAGNOSIS — T827XXA Infection and inflammatory reaction due to other cardiac and vascular devices, implants and grafts, initial encounter: Secondary | ICD-10-CM | POA: Diagnosis not present

## 2022-08-16 LAB — GLUCOSE, CAPILLARY
Glucose-Capillary: 101 mg/dL — ABNORMAL HIGH (ref 70–99)
Glucose-Capillary: 95 mg/dL (ref 70–99)
Glucose-Capillary: 112 mg/dL — ABNORMAL HIGH (ref 70–99)
Glucose-Capillary: 98 mg/dL (ref 70–99)

## 2022-08-16 LAB — BASIC METABOLIC PANEL
Anion gap: 7 (ref 5–15)
BUN: 17 mg/dL (ref 6–20)
CO2: 26 mmol/L (ref 22–32)
Calcium: 10 mg/dL (ref 8.9–10.3)
Chloride: 106 mmol/L (ref 98–111)
Creatinine, Ser: 1.26 mg/dL — ABNORMAL HIGH (ref 0.61–1.24)
GFR, Estimated: >60 mL/min (ref >60)
Glucose, Bld: 102 mg/dL — ABNORMAL HIGH (ref 70–99)
Potassium: 4.4 mmol/L (ref 3.5–5.1)
Sodium: 139 mmol/L (ref 135–145)

## 2022-08-16 LAB — PROTIME-INR
INR: 1 (ref 0.8–1.2)
Prothrombin Time: 13.2 seconds (ref 11.4–15.2)

## 2022-08-16 LAB — CBC
HCT: 37.4 % — ABNORMAL LOW (ref 39.0–52.0)
Hemoglobin: 12.6 g/dL — ABNORMAL LOW (ref 13.0–17.0)
MCH: 30.4 pg (ref 26.0–34.0)
MCHC: 33.7 g/dL (ref 30.0–36.0)
MCV: 90.1 fL (ref 80.0–100.0)
Platelets: 279 10*3/uL (ref 150–400)
RBC: 4.15 MIL/uL — ABNORMAL LOW (ref 4.22–5.81)
RDW: 14.3 % (ref 11.5–15.5)
WBC: 7.3 10*3/uL (ref 4.0–10.5)
nRBC: 0 % (ref 0.0–0.2)

## 2022-08-16 LAB — LACTATE DEHYDROGENASE: LDH: 135 U/L (ref 98–192)

## 2022-08-16 LAB — HEPARIN LEVEL (UNFRACTIONATED): Heparin Unfractionated: <0.1 IU/mL — ABNORMAL LOW (ref 0.30–0.70)

## 2022-08-16 MED ORDER — HYDRALAZINE HCL 20 MG/ML IJ SOLN: 10.0000 mg | Freq: Four times a day (QID) | INTRAMUSCULAR | Status: DC | PRN

## 2022-08-16 MED ORDER — ACETAMINOPHEN 325 MG PO TABS
650.0000 mg | ORAL_TABLET | Freq: Four times a day (QID) | ORAL | Status: DC | PRN
  Administered 2022-08-22: 650 mg via ORAL
  Filled 2022-08-16: qty 2

## 2022-08-16 NOTE — Progress Notes (Signed)
Advanced Heart Failure VAD Team Note  PCP-Cardiologist: Glori Bickers, MD   Subjective:    11/6: Admitted w/ DL infection. CT with diffuse soft-tissue involvement but no overt abscess.  Wound Cx growing few staph aureus. BCx NGTD.  11/8: s/p extensive debridement + wound vac. Wound Cx from [place] with mild ab pain but improving. + BM today. Remains on IV abx. No f/c. No SOB.   MAPs have been running high 100-110 this am   LVAD INTERROGATION:  HeartMate III LVAD:   Flow 5.0 liters/min, speed 6200, power 5.0 , PI 3.6  VAD interrogated personally. Parameters stable.  Objective:    Vital Signs:   Temp:  [98 F (36.7 C)-98.6 F (37 C)] 98 F (36.7 C) (11/12 1012) Pulse Rate:  [76-85] 76 (11/12 0004) Resp:  [16-24] 16 (11/12 1012) BP: (96-136)/(76-117) 126/100 (11/12 1012) SpO2:  [95 %-97 %] 97 % (11/12 0739) Last BM Date : 08/16/22 Mean arterial Pressure 90s  Intake/Output:   Intake/Output Summary (Last 24 hours) at 08/16/2022 1056 Last data filed at 08/16/2022 0900 Gross per 24 hour  Intake 1519.07 ml  Output 1903 ml  Net -383.93 ml      Physical Exam   General:  NAD.  HEENT: normal  Neck: supple. JVP not elevated.  Carotids 2+ bilat; no bruits. No lymphadenopathy or thryomegaly appreciated. Cor: LVAD hum.  Lungs: Clear. Abdomen: obese soft, nontender, non-distended. No hepatosplenomegaly. No bruits or masses. Good bowel sounds. Wound vac site ok. Good seal  Extremities: no cyanosis, clubbing, rash. Warm no edema  Neuro: alert & oriented x 3. No focal deficits. Moves all 4 without problem    Telemetry   NSR 70-80s Personally reviewed  Labs   Basic Metabolic Panel: Recent Labs  Lab 08/11/22 0655 08/11/22 0656 08/12/22 0436 08/13/22 0659 08/14/22 0427 08/15/22 0016 08/16/22 0550  NA  --    < > 138 137 137 137 139  K  --    < > 4.4 4.7 4.9 4.5 4.4  CL  --    < > 109 107 109 108 106  CO2  --    < > 21* '25 25 24 26  '$ GLUCOSE  --     < > 95 87 92 102* 102*  BUN  --    < > '17 16 20 19 17  '$ CREATININE  --    < > 1.24 1.31* 1.19 1.32* 1.26*  CALCIUM  --    < > 8.8* 8.9 9.1 9.6 10.0  MG 1.4*  --  2.1  --   --   --   --    < > = values in this interval not displayed.     Liver Function Tests: Recent Labs  Lab 08/10/22 1702  AST 19  ALT 16  ALKPHOS 41  BILITOT 1.1  PROT 6.9  ALBUMIN 3.4*    No results for input(s): "LIPASE", "AMYLASE" in the last 168 hours. No results for input(s): "AMMONIA" in the last 168 hours.  CBC: Recent Labs  Lab 08/12/22 0436 08/13/22 0659 08/14/22 0427 08/15/22 0016 08/16/22 0550  WBC 10.3 9.1 7.1 7.0 7.3  HGB 13.6 13.0 12.7* 12.3* 12.6*  HCT 41.9 41.2 38.2* 37.3* 37.4*  MCV 91.3 93.4 91.6 90.1 90.1  PLT 188 230 230 248 279     INR: Recent Labs  Lab 08/12/22 0436 08/13/22 0659 08/14/22 0427 08/15/22 0016 08/16/22 0550  INR 1.2 1.2 1.1 1.1 1.0     Other results:  EKG:    Imaging   No results found.   Medications:     Scheduled Medications:  allopurinol  100 mg Oral Daily   amiodarone  200 mg Oral Daily   amLODipine  10 mg Oral Daily   Chlorhexidine Gluconate Cloth  6 each Topical Daily   colchicine  0.6 mg Oral Daily   doxazosin  1 mg Oral Daily   gabapentin  300 mg Oral TID   insulin aspart  0-9 Units Subcutaneous TID WC   insulin aspart protamine- aspart  30 Units Subcutaneous BID WC   isosorbide-hydrALAZINE  2 tablet Oral TID   metFORMIN  1,000 mg Oral BID WC   multivitamin with minerals  1 tablet Oral Daily   nutrition supplement (JUVEN)  1 packet Oral BID BM   pantoprazole  40 mg Oral Daily   sacubitril-valsartan  1 tablet Oral BID   sertraline  150 mg Oral Daily   sodium chloride flush  10-40 mL Intracatheter Q12H   spironolactone  25 mg Oral Daily   traZODone  100 mg Oral QHS    Infusions:  sodium chloride 10 mL/hr at 08/14/22 2321    ceFAZolin (ANCEF) IV 2 g (08/16/22 0551)   heparin 500 Units/hr (08/16/22 0714)    PRN  Medications: sodium chloride, acetaminophen, morphine injection, naloxone, mouth rinse, oxyCODONE, sodium chloride flush, traMADol   Patient Profile   46 y.o. male with a history of  poorly controlled HTN, R renal cell carcinoma s/p nephrectomy, CKD IIIa, DM2, OSA, gout, morbid obesity and systolic HF due to NICM. 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    Assessment/Plan:     1. DL infection/ Abdominal Wall Cellulitis  - prior DL infection 3/10, wound cx + rare staph aureus, resolved w/ outpatient abx  - now w/ recurrent infection  - wound Cx + staph aureus, blood cx NGTD - CT with diffuse soft-tissue involvement but no overt abscess - ID following, abx narrowed to cefazolin  - s/p debridement + wound vac 11/8. Plan return to OR 11/14 - Remains AF. Site stable with vac in place. Continue IV abx   2.  Chronic systolic CHF  - Nonischemic CM, end-stage  - EF 2012; 20-25%  - Echo  8/22: EF < 20%, severe LV dilation, moderate RV dysfunction.   - RHC (8/22): well compensated hemodynamics.  - s/p S-ICD  - S/p HM3 LVAD 10/17 (DT)  - Continue Bidil 2 tabs tid  - Continue spironolactone 25 mg daily  - Continue entresto to 97/130 bid  - Resume ozempic after discharge.  - Volume status ok. Holding diureticss    3. VAD  - s/p HM-3 placement 07/21/21  - VAD interrogated personally. Parameters stable. - Holding Coumadin for debridgement of DL. - LDH 135 - Remains on heparin gtt.    4 Atrial fibrillation: Paroxysmal  - Had severe AF with RVR 7/22 s/p DC-CV x 2  - Seen by EP. Not a candidate for ablation due to body habitus  (could consider AVN ablation and CRT, if needed)  - Had post-op AF with RVR post VAD  - 10/23 S/P DC-CV with restoration of NSR.  - In SR Continue amio 200 daily  - Hold coumadin per above. Continue heparin gtt    5. HTN  -  MAPs elevated this am.  - Will follow. If MAP > 100 can give IV hydralazine prn   6.  CKD stage 3B  - Has  solitary kidney.   - Baseline creatinine is 1.5-1.8.  - sCr 1.26 today - holding diuretic  7. OSA  - Continue CPAP    8. DM2  - On Jardiance.  - Hgb A1c 9.4. -> 5.9  - PCP following    9. Polyarticular gout  - quiescent    10. Depression/anxiety  - Continue Zoloft '150mg'$  daily    11. Morbid obesity  -Body mass index is 46.37 kg/m. - Hold GLP1RA for OR (last dose 11/2)   I reviewed the LVAD parameters from today, and compared the results to the patient's prior recorded data.  No programming changes were made.  The LVAD is functioning within specified parameters.  The patient performs LVAD self-test daily.  LVAD interrogation was negative for any significant power changes, alarms or PI events/speed drops.  LVAD equipment check completed and is in good working order.  Back-up equipment present.   LVAD education done on emergency procedures and precautions and reviewed exit site care.  Length of Stay: [place], MD 08/16/2022, 10:56 AM  VAD Team --- VAD ISSUES ONLY--- Pager [phone] (7am - 7am)  Advanced Heart Failure Team  Pager [phone] (M-F; 7a - 5p)  Please contact [facility] for night-coverage after hours (5p -7a ) and weekends on [email or web address]

## 2022-08-16 NOTE — Progress Notes (Signed)
Pt refused CPAP for the night.   

## 2022-08-16 NOTE — Progress Notes (Signed)
Mobility Specialist: Progress Note   08/16/22 1224  Mobility  Activity Refused mobility   Pt refused mobility secondary to back and abdominal pain. Will f/u as able.   [place] Zayvien Canning Mobility Specialist Please contact via SecureChat or Rehab office at [phone]

## 2022-08-16 NOTE — Progress Notes (Signed)
MD notified of bps with MAP greater than 100. MD gave verbal order for PRN Hydralazine.

## 2022-08-16 NOTE — Plan of Care (Signed)
  Problem: Cardiac: Goal: LVAD will function as expected and patient will experience no clinical alarms Outcome: Progressing   Problem: Education: Goal: Knowledge of General Education information will improve Description: Including pain rating scale, medication(s)/side effects and non-pharmacologic comfort measures Outcome: Progressing   Problem: Health Behavior/Discharge Planning: Goal: Ability to manage health-related needs will improve Outcome: Progressing   Problem: Clinical Measurements: Goal: Respiratory complications will improve Outcome: Progressing Goal: Cardiovascular complication will be avoided Outcome: Progressing   Problem: Activity: Goal: Risk for activity intolerance will decrease Outcome: Progressing   Problem: Nutrition: Goal: Adequate nutrition will be maintained Outcome: Progressing   Problem: Coping: Goal: Level of anxiety will decrease Outcome: Progressing   Problem: Elimination: Goal: Will not experience complications related to urinary retention Outcome: Progressing   Problem: Pain Managment: Goal: General experience of comfort will improve Outcome: Progressing   Problem: Safety: Goal: Ability to remain free from injury will improve Outcome: Progressing   Problem: Coping: Goal: Ability to adjust to condition or change in health will improve Outcome: Progressing   Problem: Metabolic: Goal: Ability to maintain appropriate glucose levels will improve Outcome: Progressing   Problem: Nutritional: Goal: Maintenance of adequate nutrition will improve Outcome: Progressing   Problem: Tissue Perfusion: Goal: Adequacy of tissue perfusion will improve Outcome: Progressing

## 2022-08-16 NOTE — Progress Notes (Signed)
[facility] for Heparin Indication:  VAD  Allergies  Allergen Reactions   Aspirin Shortness Of Breath, Itching and Rash     Burning sensation (Patient reports he tolerates other NSAIDS)    Bee Venom Hives and Swelling   Lisinopril Cough   Shrimp (Diagnostic) Rash    Patient Measurements: Height: '5\' 7"'$  (170.2 cm) Weight: 134.3 kg (296 lb 1.2 oz) IBW/kg (Calculated) : 66.1 Heparin Dosing Weight: 98 kg  Vital Signs: Temp: 98.2 F (36.8 C) (11/12 0320) Temp Source: Oral (11/12 0320) BP: 114/97 (11/12 0320) Pulse Rate: 76 (11/12 0004)  Labs: Recent Labs    08/14/22 0427 08/15/22 0016 08/16/22 0550  HGB 12.7* 12.3* 12.6*  HCT 38.2* 37.3* 37.4*  PLT 230 248 279  LABPROT 13.6 14.2 13.2  INR 1.1 1.1 1.0  HEPARINUNFRC <0.10* <0.10* <0.10*  CREATININE 1.19 1.32* 1.26*     Estimated Creatinine Clearance: 95.7 mL/min (A) (by C-G formula based on SCr of 1.26 mg/dL (H)).   Medical History: Past Medical History:  Diagnosis Date   Anxiety    Aspirin allergy    Childhood asthma    Chronic systolic CHF (congestive heart failure) ([place])    a. EF 20-25% in 2012. b. EF 45-50% in 10/2011 with nonischemic nuc - presumed NICM. c. 12/2014 Echo: Sev depressed LV fxn, sev dil LV, mild LVH, mild MR, sev dil LA, mildly reduced RV fxn.   CKD (chronic kidney disease) stage 2, GFR 60-89 ml/min    H/O vasectomy 12/2019   High cholesterol    Hypertension    Morbid obesity ([place])    Nephrolithiasis    OSA on CPAP    Paroxysmal atrial fibrillation ([place])    Presumed NICM    a. 04/2014 Myoview: EF 26%, glob HK, sev glob HK, ? prior infarct;  b. Never cathed 2/2 CKD.   Renal cell carcinoma ([place])    a. s/p Rt robotic assisted partial converted to radical nephrectomy on 01/2013.   Troponin level elevated    a. 04/2014, 12/2014: felt due to CHF.   Type II diabetes mellitus (HCC)    Ventricular tachycardia (HCC)    a. appropriate ICD therapy 12/2017     Assessment: 46 yo male with a VAD HM3, presents to clinic w/ signs of  abdominal wall cellulitis and DL infection. + drainage from site. + low grade fever 99.8.  Patient is managed as an outpatient on Warfarin therapy. Pharmacy consulted to start a heparin infusion when INR < 2.  INR 1.1. Coumadin on hold for ongoing planned debridement this week. Heparin level continues to be undetectable this morning as expected on low dose heparin drip 500 units/hr with no up titration.  Had slight bleeding around surgical site post debridement but now but resolved . CBC and LDH stable.   PTA Warfarin - 8 mg daily, except 4 mg on Tues and Sat.  Goal of Therapy:  Heparin level < 0.3 INR 2-2.5 post debridement  Monitor platelets by anticoagulation protocol: Yes   Plan:  Continue IV heparin at low rate of 500 units/hr Daily heparin level and CBC.  Erin Hearing PharmD., BCPS Clinical Pharmacist 08/16/2022 7:36 AM

## 2022-08-17 DIAGNOSIS — T827XXA Infection and inflammatory reaction due to other cardiac and vascular devices, implants and grafts, initial encounter: Secondary | ICD-10-CM | POA: Diagnosis not present

## 2022-08-17 LAB — BASIC METABOLIC PANEL
Anion gap: 7 (ref 5–15)
BUN: 19 mg/dL (ref 6–20)
CO2: 26 mmol/L (ref 22–32)
Calcium: 10 mg/dL (ref 8.9–10.3)
Chloride: 104 mmol/L (ref 98–111)
Creatinine, Ser: 1.34 mg/dL — ABNORMAL HIGH (ref 0.61–1.24)
GFR, Estimated: >60 mL/min (ref >60)
Glucose, Bld: 99 mg/dL (ref 70–99)
Potassium: 4.3 mmol/L (ref 3.5–5.1)
Sodium: 137 mmol/L (ref 135–145)

## 2022-08-17 LAB — CBC
HCT: 38.7 % — ABNORMAL LOW (ref 39.0–52.0)
Hemoglobin: 13 g/dL (ref 13.0–17.0)
MCH: 30.3 pg (ref 26.0–34.0)
MCHC: 33.6 g/dL (ref 30.0–36.0)
MCV: 90.2 fL (ref 80.0–100.0)
Platelets: 312 10*3/uL (ref 150–400)
RBC: 4.29 MIL/uL (ref 4.22–5.81)
RDW: 14.3 % (ref 11.5–15.5)
WBC: 7.2 10*3/uL (ref 4.0–10.5)
nRBC: 0 % (ref 0.0–0.2)

## 2022-08-17 LAB — HEPARIN LEVEL (UNFRACTIONATED): Heparin Unfractionated: <0.1 IU/mL — ABNORMAL LOW (ref 0.30–0.70)

## 2022-08-17 LAB — PROTIME-INR
INR: 1 (ref 0.8–1.2)
Prothrombin Time: 12.7 seconds (ref 11.4–15.2)

## 2022-08-17 LAB — AEROBIC/ANAEROBIC CULTURE W GRAM STAIN (SURGICAL/DEEP WOUND)
Culture: NO GROWTH
Gram Stain: NONE SEEN

## 2022-08-17 LAB — TYPE AND SCREEN
ABO/RH(D): O POS
Antibody Screen: NEGATIVE

## 2022-08-17 LAB — GLUCOSE, CAPILLARY
Glucose-Capillary: 110 mg/dL — ABNORMAL HIGH (ref 70–99)
Glucose-Capillary: 103 mg/dL — ABNORMAL HIGH (ref 70–99)
Glucose-Capillary: 118 mg/dL — ABNORMAL HIGH (ref 70–99)
Glucose-Capillary: 101 mg/dL — ABNORMAL HIGH (ref 70–99)

## 2022-08-17 LAB — LACTATE DEHYDROGENASE: LDH: 149 U/L (ref 98–192)

## 2022-08-17 MED ORDER — CEFAZOLIN SODIUM-DEXTROSE 2-4 GM/100ML-% IV SOLN
2.0000 g | INTRAVENOUS | Status: AC
  Administered 2022-08-18: 2 g via INTRAVENOUS
  Filled 2022-08-17: qty 100

## 2022-08-17 NOTE — Plan of Care (Signed)
  Problem: Education: Goal: Patient will understand all VAD equipment and how it functions Outcome: Progressing Goal: Patient will be able to verbalize current INR target range and antiplatelet therapy for discharge home Outcome: Progressing   Problem: Cardiac: Goal: LVAD will function as expected and patient will experience no clinical alarms Outcome: Progressing   Problem: Education: Goal: Knowledge of General Education information will improve Description: Including pain rating scale, medication(s)/side effects and non-pharmacologic comfort measures Outcome: Progressing   Problem: Health Behavior/Discharge Planning: Goal: Ability to manage health-related needs will improve Outcome: Progressing   Problem: Clinical Measurements: Goal: Ability to maintain clinical measurements within normal limits will improve Outcome: Progressing Goal: Will remain free from infection Outcome: Progressing Goal: Diagnostic test results will improve Outcome: Progressing Goal: Respiratory complications will improve Outcome: Progressing Goal: Cardiovascular complication will be avoided Outcome: Progressing   Problem: Activity: Goal: Risk for activity intolerance will decrease Outcome: Progressing   Problem: Nutrition: Goal: Adequate nutrition will be maintained Outcome: Progressing   Problem: Coping: Goal: Level of anxiety will decrease Outcome: Progressing   Problem: Elimination: Goal: Will not experience complications related to bowel motility Outcome: Progressing Goal: Will not experience complications related to urinary retention Outcome: Progressing   Problem: Pain Managment: Goal: General experience of comfort will improve Outcome: Progressing   Problem: Safety: Goal: Ability to remain free from injury will improve Outcome: Progressing   Problem: Skin Integrity: Goal: Risk for impaired skin integrity will decrease Outcome: Progressing   Problem: Education: Goal: Ability  to describe self-care measures that may prevent or decrease complications (Diabetes Survival Skills Education) will improve Outcome: Progressing Goal: Individualized Educational Video(s) Outcome: Progressing   Problem: Coping: Goal: Ability to adjust to condition or change in health will improve Outcome: Progressing   Problem: Fluid Volume: Goal: Ability to maintain a balanced intake and output will improve Outcome: Progressing   Problem: Health Behavior/Discharge Planning: Goal: Ability to identify and utilize available resources and services will improve Outcome: Progressing Goal: Ability to manage health-related needs will improve Outcome: Progressing   Problem: Metabolic: Goal: Ability to maintain appropriate glucose levels will improve Outcome: Progressing   Problem: Nutritional: Goal: Maintenance of adequate nutrition will improve Outcome: Progressing Goal: Progress toward achieving an optimal weight will improve Outcome: Progressing   Problem: Skin Integrity: Goal: Risk for impaired skin integrity will decrease Outcome: Progressing   Problem: Tissue Perfusion: Goal: Adequacy of tissue perfusion will improve Outcome: Progressing

## 2022-08-17 NOTE — Progress Notes (Signed)
5 Days Post-Op Procedure(s) (LRB): ABDOMINAL WOUND DEBRIDEMENT (N/A) APPLICATION OF WOUND VAC (N/A) Subjective: Wound vac with blockage alarm prob from blood hematoma in vac sponge- will DC sponge and place sterile wet/dry  Last wound C&S no growth- on IV ANCEF for MSSA Remains tender in abd wall after debridement- will need long term iv antibiotics  Objective: Vital signs in last 24 hours: Temp:  [97.8 F (36.6 C)-98.4 F (36.9 C)] 98.3 F (36.8 C) (11/13 0748) Pulse Rate:  [69-98] 84 (11/13 0748) Cardiac Rhythm: Normal sinus rhythm;Bundle branch block (11/13 0700) Resp:  [15-23] 15 (11/13 0748) BP: (97-128)/(59-109) 112/94 (11/13 0851) SpO2:  [94 %-97 %] 94 % (11/13 0442) Weight:  [131.7 kg] 131.7 kg (11/13 0500)  Hemodynamic parameters for last 24 hours:    Intake/Output from previous day: 11/12 0701 - 11/13 0700 In: 968.2 [P.O.:845; I.V.:123.2] Out: 835 [Urine:800; Drains:35] Intake/Output this shift: Total I/O In: 251 [P.O.:240; I.V.:11] Out: -      Physical Exam       Exam    General- alert and comfortable    Neck- no JVD, no cervical adenopathy palpable, no carotid bruit   Lungs- clear without rales, wheezes   Cor- regular rate and rhythm, no murmur , gallop normal VAD hum   Abdomen- soft, mid abdominal tenderness   Extremities - warm, non-tender, minimal edema   Neuro- oriented, appropriate, no focal weakness   Lab Results: Recent Labs    08/16/22 0550 08/17/22 0450  WBC 7.3 7.2  HGB 12.6* 13.0  HCT 37.4* 38.7*  PLT 279 312   BMET:  Recent Labs    08/16/22 0550 08/17/22 0450  NA 139 137  K 4.4 4.3  CL 106 104  CO2 26 26  GLUCOSE 102* 99  BUN 17 19  CREATININE 1.26* 1.34*  CALCIUM 10.0 10.0    PT/INR:  Recent Labs    08/17/22 0450  LABPROT 12.7  INR 1.0   ABG    Component Value Date/Time   PHART 7.368 07/25/2021 0436   HCO3 28.9 (H) 07/25/2021 0436   TCO2 30 07/25/2021 0436   ACIDBASEDEF 1.0 07/24/2021 0449   O2SAT 73.7  08/05/2021 0515   CBG (last 3)  Recent Labs    08/16/22 1641 08/16/22 2258 08/17/22 0641  GLUCAP 112* 98 110*    Assessment/Plan: S/P Procedure(s) (LRB): ABDOMINAL WOUND DEBRIDEMENT (N/A) APPLICATION OF WOUND VAC (N/A) Plan wound VAC change in OR tomorrow Procedure d/w patient   LOS: 7 days    Dahlia Byes 08/17/2022

## 2022-08-17 NOTE — Progress Notes (Signed)
Mobility Specialist Progress Note    08/17/22 1424  Mobility  Activity Ambulated independently in hallway  Level of Assistance Independent  Assistive Device None  Distance Ambulated (ft) 800 ft  Activity Response Tolerated well  Mobility Referral Yes  $Mobility charge 1 Mobility   Pre-Mobility: 84 HR Post-Mobility: 92 HR  Pt received in bed and agreeable. C/o some abdominal pain. Returned to bed with call [place] in reach. RN aware.   [name] Mobility Specialist  Please Psychologist, sport and exercise or Rehab Office at [phone]

## 2022-08-17 NOTE — Plan of Care (Signed)
  Problem: Education: Goal: Patient will understand all VAD equipment and how it functions Outcome: Progressing Goal: Patient will be able to verbalize current INR target range and antiplatelet therapy for discharge home Outcome: Progressing   Problem: Cardiac: Goal: LVAD will function as expected and patient will experience no clinical alarms Outcome: Progressing   Problem: Education: Goal: Knowledge of General Education information will improve Description: Including pain rating scale, medication(s)/side effects and non-pharmacologic comfort measures Outcome: Progressing   Problem: Health Behavior/Discharge Planning: Goal: Ability to manage health-related needs will improve Outcome: Progressing   Problem: Clinical Measurements: Goal: Ability to maintain clinical measurements within normal limits will improve Outcome: Progressing Goal: Diagnostic test results will improve Outcome: Progressing Goal: Respiratory complications will improve Outcome: Progressing   Problem: Activity: Goal: Risk for activity intolerance will decrease Outcome: Progressing   Problem: Nutrition: Goal: Adequate nutrition will be maintained Outcome: Progressing   Problem: Coping: Goal: Level of anxiety will decrease Outcome: Progressing   Problem: Elimination: Goal: Will not experience complications related to bowel motility Outcome: Progressing Goal: Will not experience complications related to urinary retention Outcome: Progressing   Problem: Pain Managment: Goal: General experience of comfort will improve Outcome: Progressing   Problem: Safety: Goal: Ability to remain free from injury will improve Outcome: Progressing   Problem: Skin Integrity: Goal: Risk for impaired skin integrity will decrease Outcome: Progressing   Problem: Metabolic: Goal: Ability to maintain appropriate glucose levels will improve Outcome: Progressing

## 2022-08-17 NOTE — TOC CM/SW Note (Signed)
HF TOC CM received call from [facility] rep, and needs wound measurements. Pt scheduled for OR tomorrow, and LVAD Coordinator will get wound measurements. Plan to deliver wound vac to room 11/14 once insurance auth is complete. [place], Heart Failure TOC CM [phone]

## 2022-08-17 NOTE — Progress Notes (Addendum)
Advanced Heart Failure VAD Team Note  PCP-Cardiologist: Glori Bickers, MD   Subjective:    11/6: Admitted w/ DL infection. CT with diffuse soft-tissue involvement but no overt abscess. Wound Cx growing few staph aureus. BCx NGTD.  11/8: s/p extensive debridement + wound vac. Wound Cx from OR NGTD  Continues with ab pain/tenderness. Remains on IV abx. No SOB. Ambulated yesterday.  Wound vac w/ clog +/- leak. Being troubleshooted soon.   Plan for OR tomorrow  LVAD INTERROGATION:  HeartMate III LVAD:   Flow 5.2 liters/min, speed 6200, power 5.2 , PI 2.7. 6 PI events today. VAD interrogated personally. Parameters stable.  Objective:    Vital Signs:   Temp:  [97.8 F (36.6 C)-98.4 F (36.9 C)] 98.3 F (36.8 C) (11/13 0748) Pulse Rate:  [69-98] 84 (11/13 0748) Resp:  [15-23] 15 (11/13 0748) BP: (97-128)/(59-109) 112/94 (11/13 0851) SpO2:  [94 %-97 %] 94 % (11/13 0442) Weight:  [131.7 kg] 131.7 kg (11/13 0500) Last BM Date : 08/16/22 Mean arterial Pressure 90s/100s  Intake/Output:   Intake/Output Summary (Last 24 hours) at 08/17/2022 0859 Last data filed at 08/17/2022 0824 Gross per 24 hour  Intake 840.2 ml  Output 810 ml  Net 30.2 ml     Physical Exam   General:  Well appearing. No resp difficulty HEENT: Normal Neck: supple. No JVP. Carotids 2+ bilat; no bruits. No lymphadenopathy or thyromegaly appreciated. Cor: Mechanical heart sounds with LVAD hum present. Lungs: Clear Abdomen: soft, tender, nondistended. No hepatosplenomegaly. No bruits or masses. Good bowel sounds. Driveline: C/D/I; wound vac in place. Stat lock in place. Extremities: no cyanosis, clubbing, rash, edema Neuro: alert & orientedx3, cranial nerves grossly intact. moves all 4 extremities w/o difficulty. Affect pleasant   Telemetry   NSR 70s Personally reviewed  Labs   Basic Metabolic Panel: Recent Labs  Lab 08/11/22 0655 08/11/22 0656 08/12/22 0436 08/13/22 0659 08/14/22 0427  08/15/22 0016 08/16/22 0550 08/17/22 0450  NA  --    < > 138 137 137 137 139 137  K  --    < > 4.4 4.7 4.9 4.5 4.4 4.3  CL  --    < > 109 107 109 108 106 104  CO2  --    < > 21* '25 25 24 26 26  '$ GLUCOSE  --    < > 95 87 92 102* 102* 99  BUN  --    < > '17 16 20 19 17 19  '$ CREATININE  --    < > 1.24 1.31* 1.19 1.32* 1.26* 1.34*  CALCIUM  --    < > 8.8* 8.9 9.1 9.6 10.0 10.0  MG 1.4*  --  2.1  --   --   --   --   --    < > = values in this interval not displayed.    Liver Function Tests: Recent Labs  Lab 08/10/22 1702  AST 19  ALT 16  ALKPHOS 41  BILITOT 1.1  PROT 6.9  ALBUMIN 3.4*   No results for input(s): "LIPASE", "AMYLASE" in the last 168 hours. No results for input(s): "AMMONIA" in the last 168 hours.  CBC: Recent Labs  Lab 08/13/22 0659 08/14/22 0427 08/15/22 0016 08/16/22 0550 08/17/22 0450  WBC 9.1 7.1 7.0 7.3 7.2  HGB 13.0 12.7* 12.3* 12.6* 13.0  HCT 41.2 38.2* 37.3* 37.4* 38.7*  MCV 93.4 91.6 90.1 90.1 90.2  PLT 230 230 248 279 312    INR: Recent Labs  Lab 08/13/22  6195 08/14/22 0427 08/15/22 0016 08/16/22 0550 08/17/22 0450  INR 1.2 1.1 1.1 1.0 1.0    Other results:   Imaging   No results found.   Medications:     Scheduled Medications:  allopurinol  100 mg Oral Daily   amiodarone  200 mg Oral Daily   amLODipine  10 mg Oral Daily   Chlorhexidine Gluconate Cloth  6 each Topical Daily   colchicine  0.6 mg Oral Daily   doxazosin  1 mg Oral Daily   gabapentin  300 mg Oral TID   insulin aspart  0-9 Units Subcutaneous TID WC   insulin aspart protamine- aspart  30 Units Subcutaneous BID WC   isosorbide-hydrALAZINE  2 tablet Oral TID   metFORMIN  1,000 mg Oral BID WC   multivitamin with minerals  1 tablet Oral Daily   nutrition supplement (JUVEN)  1 packet Oral BID BM   pantoprazole  40 mg Oral Daily   sacubitril-valsartan  1 tablet Oral BID   sertraline  150 mg Oral Daily   sodium chloride flush  10-40 mL Intracatheter Q12H    spironolactone  25 mg Oral Daily   traZODone  100 mg Oral QHS    Infusions:  sodium chloride 10 mL/hr at 08/14/22 2321    ceFAZolin (ANCEF) IV 2 g (08/17/22 0932)   heparin 500 Units/hr (08/17/22 0442)    PRN Medications: sodium chloride, acetaminophen, hydrALAZINE, morphine injection, naloxone, mouth rinse, oxyCODONE, sodium chloride flush, traMADol   Patient Profile   46 y.o. male with a history of  poorly controlled HTN, R renal cell carcinoma s/p nephrectomy, CKD IIIa, DM2, OSA, gout, morbid obesity and systolic HF due to NICM. 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    Assessment/Plan:     1. DL infection/ Abdominal Wall Cellulitis  - prior DL infection 3/10, wound cx + rare staph aureus, resolved w/ outpatient abx  - now w/ recurrent infection  - wound Cx + staph aureus, blood cx NGTD - CT with diffuse soft-tissue involvement but no overt abscess - ID following, abx narrowed to cefazolin  - s/p debridement + wound vac 11/8. Plan return to OR 11/14 - Remains AF. Site stable with vac in place, troubleshoot pending. Continue IV abx   2.  Chronic systolic CHF  - Nonischemic CM, end-stage  - EF 2012; 20-25%  - Echo  8/22: EF < 20%, severe LV dilation, moderate RV dysfunction.   - RHC (8/22): well compensated hemodynamics.  - s/p S-ICD  - S/p HM3 LVAD 10/17 (DT)  - Continue Bidil 2 tabs tid  - Continue spironolactone 25 mg daily  - Continue entresto 97/103 bid  - Resume ozempic after discharge.  - Volume status stable. Holding diureticss    3. VAD  - s/p HM-3 placement 07/21/21  - VAD interrogated personally. Parameters stable. - Holding Coumadin for debridement of DL. - LDH 149 - Remains on heparin gtt.    4 Atrial fibrillation: Paroxysmal  - Had severe AF with RVR 7/22 s/p DC-CV x 2  - Seen by EP. Not a candidate for ablation due to body habitus  (could consider AVN ablation and CRT, if needed)  - Had post-op AF  with RVR post VAD  - 10/23 S/P DC-CV with restoration of NSR.  - In SR Continue amio 200 daily  - Hold coumadin per above. Continue heparin gtt    5. HTN  -  MAPs elevated this am.  -  If MAP > 100 can give IV hydralazine prn   6. CKD stage 3B  - Has solitary kidney.   - Baseline creatinine is 1.5-1.8.  - sCr 1.36 today - holding diuretic  7. OSA  - Continue CPAP    8. DM2  - On Jardiance.  - Hgb A1c 9.4. -> 5.9  - PCP following    9. Polyarticular gout  - quiescent    10. Depression/anxiety  - Continue Zoloft '150mg'$  daily    11. Morbid obesity  -Body mass index is 45.48 kg/m. - Hold GLP1RA for OR (last dose 11/2)   I reviewed the LVAD parameters from today, and compared the results to the patient's prior recorded data.  No programming changes were made.  The LVAD is functioning within specified parameters.  The patient performs LVAD self-test daily.  LVAD interrogation was negative for any significant power changes, alarms or PI events/speed drops.  LVAD equipment check completed and is in good working order.  Back-up equipment present.   LVAD education done on emergency procedures and precautions and reviewed exit site care.  Length of Stay: [address], AGACNP-BC  08/17/2022, 8:59 AM  VAD Team --- VAD ISSUES ONLY--- Pager [phone] (7am - 7am)  Advanced Heart Failure Team  Pager [phone] (M-F; 7a - 5p)  Please contact [facility] for night-coverage after hours (5p -7a ) and weekends on [email or web address]  Patient seen and examined with the above-signed Advanced Practice Provider and/or Housestaff. I personally reviewed laboratory data, imaging studies and relevant notes. I independently examined the patient and formulated the important aspects of the plan. I have edited the note to reflect any of my changes or salient points. I have personally discussed the plan with the patient and/or family.  Still with pain at DL site. Wound vac clogged. Remains on IV abx.  Afebrile.   General:  NAD.  HEENT: normal  Neck: supple. JVP not elevated.  Carotids 2+ bilat; no bruits. No lymphadenopathy or thryomegaly appreciated. Cor: LVAD hum.  Lungs: Clear. Abdomen: obese soft, nontender, non-distended. No hepatosplenomegaly. No bruits or masses. Good bowel sounds. Wound vac in place.  Anchor in place.  Extremities: no cyanosis, clubbing, rash. Warm no edema  Neuro: alert & oriented x 3. No focal deficits. Moves all 4 without problem   Continue heparin. For wound vac change in OR tomorrow. Continue IV abx.   VAD interrogated personally. Parameters stable.  Glori Bickers, MD  10:34 AM

## 2022-08-17 NOTE — Progress Notes (Signed)
Inpatient Diabetes Program Recommendations  AACE/ADA: New Consensus Statement on Inpatient Glycemic Control (2015)  Target Ranges:  Prepandial:   less than 140 mg/dL      Peak postprandial:   less than 180 mg/dL (1-2 hours)      Critically ill patients:  140 - 180 mg/dL   Lab Results  Component Value Date   GLUCAP 103 (H) 08/17/2022   HGBA1C 5.3 03/16/2022    Review of Glycemic Control  Latest Reference Range & Units 08/17/22 06:41 08/17/22 11:40  Glucose-Capillary 70 - 99 mg/dL 110 (H) 103 (H)   Diabetes history: DM  Outpatient Diabetes medications:  Novolog 70/30 34 units bid Metformin 1000 mg bid Ozempic 2 mg weekly Current orders for Inpatient glycemic control:  Novolog 70/30- 30 units bid Novolog 0-9 units tid with meals  Inpatient Diabetes Program Recommendations:    Consider discontinuation/holding Novolog 70/30.  Patient has not taken since 08/13/22. He will be NPO for surgery tomorrow.   Will follow.   Thanks,  Adah Perl, RN, BC-ADM Inpatient Diabetes Coordinator Pager [phone]  (8a-5p)

## 2022-08-17 NOTE — Progress Notes (Signed)
[facility] for Heparin Indication:  VAD  Allergies  Allergen Reactions   Aspirin Shortness Of Breath, Itching and Rash     Burning sensation (Patient reports he tolerates other NSAIDS)    Bee Venom Hives and Swelling   Lisinopril Cough   Shrimp (Diagnostic) Rash    Patient Measurements: Height: '5\' 7"'$  (170.2 cm) Weight: 131.7 kg (290 lb 6.4 oz) IBW/kg (Calculated) : 66.1 Heparin Dosing Weight: 98 kg  Vital Signs: Temp: 98.3 F (36.8 C) (11/13 0748) Temp Source: Oral (11/13 0748) BP: 112/94 (11/13 0851) Pulse Rate: 84 (11/13 0748)  Labs: Recent Labs    08/15/22 0016 08/16/22 0550 08/17/22 0450  HGB 12.3* 12.6* 13.0  HCT 37.3* 37.4* 38.7*  PLT 248 279 312  LABPROT 14.2 13.2 12.7  INR 1.1 1.0 1.0  HEPARINUNFRC <0.10* <0.10* <0.10*  CREATININE 1.32* 1.26* 1.34*     Estimated Creatinine Clearance: 89 mL/min (A) (by C-G formula based on SCr of 1.34 mg/dL (H)).   Medical History: Past Medical History:  Diagnosis Date   Anxiety    Aspirin allergy    Childhood asthma    Chronic systolic CHF (congestive heart failure) ([place])    a. EF 20-25% in 2012. b. EF 45-50% in 10/2011 with nonischemic nuc - presumed NICM. c. 12/2014 Echo: Sev depressed LV fxn, sev dil LV, mild LVH, mild MR, sev dil LA, mildly reduced RV fxn.   CKD (chronic kidney disease) stage 2, GFR 60-89 ml/min    H/O vasectomy 12/2019   High cholesterol    Hypertension    Morbid obesity ([place])    Nephrolithiasis    OSA on CPAP    Paroxysmal atrial fibrillation ([place])    Presumed NICM    a. 04/2014 Myoview: EF 26%, glob HK, sev glob HK, ? prior infarct;  b. Never cathed 2/2 CKD.   Renal cell carcinoma ([place])    a. s/p Rt robotic assisted partial converted to radical nephrectomy on 01/2013.   Troponin level elevated    a. 04/2014, 12/2014: felt due to CHF.   Type II diabetes mellitus (HCC)    Ventricular tachycardia ([facility])    a. appropriate ICD therapy 12/2017     Assessment: 46 yo male with a VAD HM3, presents to clinic w/ signs of  abdominal wall cellulitis and DL infection. + drainage from site. + low grade fever 99.8.  Patient is managed as an outpatient on Warfarin therapy. Pharmacy consulted to start a heparin infusion when INR < 2.  INR 1.0. Coumadin on hold for ongoing planned debridement this week. Heparin level continues to be undetectable this morning as expected on low dose heparin drip 500 units/hr with no up titration.  Had slight bleeding around surgical site post debridement but now but resolved . CBC and LDH stable.   PTA Warfarin - 8 mg daily, except 4 mg on Tues and Sat.  Goal of Therapy:  Heparin level < 0.3 INR 2-2.5 post debridement  Monitor platelets by anticoagulation protocol: Yes   Plan:  Continue IV heparin at low rate of 500 units/hr Daily heparin level and CBC. F/u plans to resume Coumadin after OR trip tomorrow?  [place] Crane, Roylene Reason, BCCP Clinical Pharmacist  08/17/2022 12:17 PM   [facility] pharmacy phone numbers are listed on [email or web address]

## 2022-08-17 NOTE — Progress Notes (Addendum)
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 Cyndia Bent under Destination Therapy criteria due to BMI and chronic kidney disease.    Pt laying in bed. Pt c/o pain in driveline/wound vac site, but states PRN pain medications are working.   Wound vac alarming "blockage" upon my arrival. Dr Prescott Gum at bedside. Clotted old blood noted in suction tubing. Changed out outer dressing/suction tubing. See documentation below.    Plan to return to OR for further debridement/wound vac change Tuesday 11/14 with Dr Prescott Gum. Currently on Cefazolin 2g Q 8 hrs per ID.   Pt will need wound vac and home IV antibiotics at hospital discharge. [facility] CM is working on both of these.   Vital signs: Temp:  98.3 HR: 75 NSR Doppler: 102 Auto cuff: 112/94 (100) O2 Sat: 94% on RA Wt: 298>300.9>300.4>300.9>290.4 lbs   LVAD interrogation reveals:  Speed: 6200 Flow: 5.1 Power:  5.2 w PI: 2.9 Hct: 41   Alarms:  none Events: 6 PI events so far today   Fixed speed:  6200 Low speed limit: 5900   Drive Line:  Wound vac in place suction at -125. Leak alarm noted yesterday. Dressing reinforced- alarming resolved. Alarming "blockage" this morning. Dr Prescott Gum at bedside- VAD coordinator will change out outer dressing/suction tubing.  Outer wound vac dressing removed using sterile technique. Vac sponge left in place. Site cleansed with betadine swab x 3, and allowed to dry. Wound vac sponge covered with clear vac dressing. Attached to suction -125. Good seal achieved. No leak or blockage alarms noted. Anchor reapplied. Changed suction canister. Plan for washout/wound vac change in OR tomorrow afternoon per Dr Prescott Gum.   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 Call VAD coordinator if any wound vac issues. Plan for wound vac change in OR with Dr Prescott Gum 08/18/22  [name] RN [place] Coordinator  Office: [phone]  24/7 Pager: [phone]

## 2022-08-18 ENCOUNTER — Inpatient Hospital Stay (HOSPITAL_COMMUNITY): Payer: Medicaid Other | Admitting: Anesthesiology

## 2022-08-18 ENCOUNTER — Encounter (HOSPITAL_COMMUNITY)
Admission: AD | Disposition: A | Discharge status: Home / Self Care | Payer: Self-pay | Source: Home / Self Care | Attending: Internal Medicine

## 2022-08-18 ENCOUNTER — Other Ambulatory Visit: Payer: Self-pay

## 2022-08-18 ENCOUNTER — Encounter (HOSPITAL_COMMUNITY): Payer: Self-pay | Admitting: Cardiology

## 2022-08-18 DIAGNOSIS — E1122 Type 2 diabetes mellitus with diabetic chronic kidney disease: Secondary | ICD-10-CM | POA: Diagnosis not present

## 2022-08-18 DIAGNOSIS — I130 Hypertensive heart and chronic kidney disease with heart failure and stage 1 through stage 4 chronic kidney disease, or unspecified chronic kidney disease: Secondary | ICD-10-CM

## 2022-08-18 DIAGNOSIS — Z87891 Personal history of nicotine dependence: Secondary | ICD-10-CM | POA: Diagnosis not present

## 2022-08-18 DIAGNOSIS — I509 Heart failure, unspecified: Secondary | ICD-10-CM

## 2022-08-18 DIAGNOSIS — N189 Chronic kidney disease, unspecified: Secondary | ICD-10-CM

## 2022-08-18 DIAGNOSIS — B9561 Methicillin susceptible Staphylococcus aureus infection as the cause of diseases classified elsewhere: Secondary | ICD-10-CM | POA: Diagnosis not present

## 2022-08-18 DIAGNOSIS — L089 Local infection of the skin and subcutaneous tissue, unspecified: Secondary | ICD-10-CM | POA: Diagnosis not present

## 2022-08-18 DIAGNOSIS — T827XXA Infection and inflammatory reaction due to other cardiac and vascular devices, implants and grafts, initial encounter: Secondary | ICD-10-CM | POA: Diagnosis not present

## 2022-08-18 DIAGNOSIS — T8189XA Other complications of procedures, not elsewhere classified, initial encounter: Secondary | ICD-10-CM | POA: Diagnosis not present

## 2022-08-18 HISTORY — PX: APPLICATION OF WOUND VAC: SHX5189

## 2022-08-18 HISTORY — PX: STERNAL WOUND DEBRIDEMENT: SHX1058

## 2022-08-18 LAB — HEPARIN LEVEL (UNFRACTIONATED): Heparin Unfractionated: <0.1 IU/mL — ABNORMAL LOW (ref 0.30–0.70)

## 2022-08-18 LAB — CBC
HCT: 38.9 % — ABNORMAL LOW (ref 39.0–52.0)
Hemoglobin: 13.1 g/dL (ref 13.0–17.0)
MCH: 30.1 pg (ref 26.0–34.0)
MCHC: 33.7 g/dL (ref 30.0–36.0)
MCV: 89.4 fL (ref 80.0–100.0)
Platelets: 300 10*3/uL (ref 150–400)
RBC: 4.35 MIL/uL (ref 4.22–5.81)
RDW: 14 % (ref 11.5–15.5)
WBC: 7.6 10*3/uL (ref 4.0–10.5)
nRBC: 0 % (ref 0.0–0.2)

## 2022-08-18 LAB — GLUCOSE, CAPILLARY
Glucose-Capillary: 109 mg/dL — ABNORMAL HIGH (ref 70–99)
Glucose-Capillary: 105 mg/dL — ABNORMAL HIGH (ref 70–99)
Glucose-Capillary: 129 mg/dL — ABNORMAL HIGH (ref 70–99)
Glucose-Capillary: 105 mg/dL — ABNORMAL HIGH (ref 70–99)
Glucose-Capillary: 169 mg/dL — ABNORMAL HIGH (ref 70–99)

## 2022-08-18 LAB — LACTATE DEHYDROGENASE: LDH: 147 U/L (ref 98–192)

## 2022-08-18 LAB — BASIC METABOLIC PANEL
Anion gap: 7 (ref 5–15)
BUN: 24 mg/dL — ABNORMAL HIGH (ref 6–20)
CO2: 27 mmol/L (ref 22–32)
Calcium: 10.4 mg/dL — ABNORMAL HIGH (ref 8.9–10.3)
Chloride: 104 mmol/L (ref 98–111)
Creatinine, Ser: 1.42 mg/dL — ABNORMAL HIGH (ref 0.61–1.24)
GFR, Estimated: >60 mL/min (ref >60)
Glucose, Bld: 107 mg/dL — ABNORMAL HIGH (ref 70–99)
Potassium: 4.2 mmol/L (ref 3.5–5.1)
Sodium: 138 mmol/L (ref 135–145)

## 2022-08-18 LAB — PROTIME-INR
INR: 1.1 (ref 0.8–1.2)
Prothrombin Time: 13.8 seconds (ref 11.4–15.2)

## 2022-08-18 SURGERY — DEBRIDEMENT, WOUND, STERNUM
Anesthesia: General

## 2022-08-18 MED ORDER — WARFARIN - PHARMACIST DOSING INPATIENT: Freq: Every day | Status: DC

## 2022-08-18 MED ORDER — PHENYLEPHRINE 80 MCG/ML (10ML) SYRINGE FOR IV PUSH (FOR BLOOD PRESSURE SUPPORT)
PREFILLED_SYRINGE | INTRAVENOUS | Status: AC
  Filled 2022-08-18: qty 30

## 2022-08-18 MED ORDER — FENTANYL CITRATE (PF) 250 MCG/5ML IJ SOLN
INTRAMUSCULAR | Status: DC | PRN
  Administered 2022-08-18 (×3): 50 ug via INTRAVENOUS

## 2022-08-18 MED ORDER — ALBUMIN HUMAN 5 % IV SOLN: INTRAVENOUS | Status: DC | PRN

## 2022-08-18 MED ORDER — ETOMIDATE 2 MG/ML IV SOLN
INTRAVENOUS | Status: AC
  Filled 2022-08-18: qty 10

## 2022-08-18 MED ORDER — PHENYLEPHRINE 80 MCG/ML (10ML) SYRINGE FOR IV PUSH (FOR BLOOD PRESSURE SUPPORT)
PREFILLED_SYRINGE | INTRAVENOUS | Status: DC | PRN
  Administered 2022-08-18 (×3): 80 ug via INTRAVENOUS

## 2022-08-18 MED ORDER — HEPARIN (PORCINE) 25000 UT/250ML-% IV SOLN
500.0000 [IU]/h | INTRAVENOUS | Status: DC
  Administered 2022-08-18: 500 [IU]/h via INTRAVENOUS

## 2022-08-18 MED ORDER — PROPOFOL 10 MG/ML IV BOLUS
INTRAVENOUS | Status: AC
  Filled 2022-08-18: qty 20

## 2022-08-18 MED ORDER — 0.9 % SODIUM CHLORIDE (POUR BTL) OPTIME
TOPICAL | Status: DC | PRN
  Administered 2022-08-18: 1000 mL

## 2022-08-18 MED ORDER — DEXAMETHASONE SODIUM PHOSPHATE 10 MG/ML IJ SOLN
INTRAMUSCULAR | Status: AC
  Filled 2022-08-18: qty 1

## 2022-08-18 MED ORDER — VANCOMYCIN HCL 1000 MG IV SOLR
INTRAVENOUS | Status: AC
  Filled 2022-08-18 (×2): qty 20

## 2022-08-18 MED ORDER — DEXAMETHASONE SODIUM PHOSPHATE 10 MG/ML IJ SOLN
INTRAMUSCULAR | Status: DC | PRN
  Administered 2022-08-18: 5 mg via INTRAVENOUS

## 2022-08-18 MED ORDER — WARFARIN SODIUM 4 MG PO TABS
4.0000 mg | ORAL_TABLET | Freq: Once | ORAL | Status: AC
  Administered 2022-08-18: 4 mg via ORAL
  Filled 2022-08-18: qty 1

## 2022-08-18 MED ORDER — ONDANSETRON HCL 4 MG/2ML IJ SOLN
INTRAMUSCULAR | Status: AC
  Filled 2022-08-18: qty 2

## 2022-08-18 MED ORDER — AMISULPRIDE (ANTIEMETIC) 5 MG/2ML IV SOLN: 10.0000 mg | Freq: Once | INTRAVENOUS | Status: DC | PRN

## 2022-08-18 MED ORDER — SUCCINYLCHOLINE CHLORIDE 200 MG/10ML IV SOSY
PREFILLED_SYRINGE | INTRAVENOUS | Status: DC | PRN
  Administered 2022-08-18: 120 mg via INTRAVENOUS

## 2022-08-18 MED ORDER — CHLORHEXIDINE GLUCONATE 0.12 % MT SOLN
15.0000 mL | Freq: Once | OROMUCOSAL | Status: AC
  Administered 2022-08-18: 15 mL via OROMUCOSAL
  Filled 2022-08-18: qty 15

## 2022-08-18 MED ORDER — FENTANYL CITRATE (PF) 250 MCG/5ML IJ SOLN
INTRAMUSCULAR | Status: AC
  Filled 2022-08-18: qty 5

## 2022-08-18 MED ORDER — LACTATED RINGERS IV SOLN: INTRAVENOUS | Status: DC

## 2022-08-18 MED ORDER — FENTANYL CITRATE (PF) 100 MCG/2ML IJ SOLN: 25.0000 ug | INTRAMUSCULAR | Status: DC | PRN

## 2022-08-18 MED ORDER — MIDAZOLAM HCL 2 MG/2ML IJ SOLN
INTRAMUSCULAR | Status: AC
  Filled 2022-08-18: qty 2

## 2022-08-18 MED ORDER — ONDANSETRON HCL 4 MG/2ML IJ SOLN
INTRAMUSCULAR | Status: DC | PRN
  Administered 2022-08-18: 4 mg via INTRAVENOUS

## 2022-08-18 MED ORDER — ZINC SULFATE 220 (50 ZN) MG PO CAPS
220.0000 mg | ORAL_CAPSULE | Freq: Every day | ORAL | Status: DC
  Administered 2022-08-18 – 2022-08-26 (×9): 220 mg via ORAL
  Filled 2022-08-18 (×10): qty 1

## 2022-08-18 MED ORDER — MIDAZOLAM HCL 2 MG/2ML IJ SOLN
INTRAMUSCULAR | Status: DC | PRN
  Administered 2022-08-18 (×2): 1 mg via INTRAVENOUS

## 2022-08-18 MED ORDER — SODIUM CHLORIDE 0.9 % IR SOLN
Status: DC | PRN
  Administered 2022-08-18: 1000 mL

## 2022-08-18 MED ORDER — ROCURONIUM BROMIDE 10 MG/ML (PF) SYRINGE
PREFILLED_SYRINGE | INTRAVENOUS | Status: DC | PRN
  Administered 2022-08-18: 40 mg via INTRAVENOUS
  Administered 2022-08-18: 20 mg via INTRAVENOUS

## 2022-08-18 MED ORDER — PROPOFOL 10 MG/ML IV BOLUS
INTRAVENOUS | Status: DC | PRN
  Administered 2022-08-18 (×3): 50 mg via INTRAVENOUS

## 2022-08-18 MED ORDER — LIDOCAINE 2% (20 MG/ML) 5 ML SYRINGE
INTRAMUSCULAR | Status: AC
  Filled 2022-08-18: qty 5

## 2022-08-18 MED ORDER — HYDROCODONE-ACETAMINOPHEN 10-325 MG PO TABS
1.0000 | ORAL_TABLET | Freq: Four times a day (QID) | ORAL | Status: DC | PRN
  Administered 2022-08-22 – 2022-08-26 (×9): 2 via ORAL
  Filled 2022-08-18 (×9): qty 2

## 2022-08-18 MED ORDER — SUGAMMADEX SODIUM 200 MG/2ML IV SOLN
INTRAVENOUS | Status: DC | PRN
  Administered 2022-08-18: 260 mg via INTRAVENOUS

## 2022-08-18 MED ORDER — ORAL CARE MOUTH RINSE: 15.0000 mL | Freq: Once | OROMUCOSAL | Status: AC

## 2022-08-18 MED ORDER — ACETAMINOPHEN 10 MG/ML IV SOLN
INTRAVENOUS | Status: DC | PRN
  Administered 2022-08-18: 1000 mg via INTRAVENOUS

## 2022-08-18 MED ORDER — ROCURONIUM BROMIDE 10 MG/ML (PF) SYRINGE
PREFILLED_SYRINGE | INTRAVENOUS | Status: AC
  Filled 2022-08-18: qty 10

## 2022-08-18 MED ORDER — ACETAMINOPHEN 10 MG/ML IV SOLN
INTRAVENOUS | Status: AC
  Filled 2022-08-18: qty 100

## 2022-08-18 MED ORDER — VANCOMYCIN HCL 1000 MG IV SOLR: INTRAVENOUS | Status: DC | PRN

## 2022-08-18 MED ORDER — EPHEDRINE 5 MG/ML INJ
INTRAVENOUS | Status: AC
  Filled 2022-08-18: qty 5

## 2022-08-18 SURGICAL SUPPLY — 75 items
APL SKNCLS STERI-STRIP NONHPOA (GAUZE/BANDAGES/DRESSINGS) ×3
ATTRACTOMAT 16X20 MAGNETIC DRP (DRAPES) ×1 IMPLANT
BAG DECANTER FOR FLEXI CONT (MISCELLANEOUS) ×1 IMPLANT
BENZOIN TINCTURE PRP APPL 2/3 (GAUZE/BANDAGES/DRESSINGS) IMPLANT
BLADE CLIPPER SURG (BLADE) ×1 IMPLANT
BLADE SURG 10 STRL SS (BLADE) ×1 IMPLANT
BLADE SURG 15 STRL LF DISP TIS (BLADE) IMPLANT
BLADE SURG 15 STRL SS (BLADE)
BNDG GAUZE DERMACEA FLUFF 4 (GAUZE/BANDAGES/DRESSINGS) IMPLANT
BNDG GZE DERMACEA 4 6PLY (GAUZE/BANDAGES/DRESSINGS)
CANISTER SUCT 3000ML PPV (MISCELLANEOUS) ×1 IMPLANT
CANISTER WOUND CARE 500ML ATS (WOUND CARE) ×1 IMPLANT
CATH FOLEY 2WAY SLVR  5CC 16FR (CATHETERS)
CATH FOLEY 2WAY SLVR 5CC 16FR (CATHETERS) IMPLANT
CATH THORACIC 28FR RT ANG (CATHETERS) IMPLANT
CATH THORACIC 36FR (CATHETERS) IMPLANT
CLIP VESOCCLUDE SM WIDE 24/CT (CLIP) IMPLANT
CNTNR URN SCR LID CUP LEK RST (MISCELLANEOUS) IMPLANT
CONN Y 3/8X3/8X3/8  BEN (MISCELLANEOUS)
CONN Y 3/8X3/8X3/8 BEN (MISCELLANEOUS) IMPLANT
CONT SPEC 4OZ STRL OR WHT (MISCELLANEOUS)
CONTAINER PROTECT SURGISLUSH (MISCELLANEOUS) ×2 IMPLANT
COVER SURGICAL LIGHT HANDLE (MISCELLANEOUS) ×2 IMPLANT
DRAPE LAPAROSCOPIC ABDOMINAL (DRAPES) ×1 IMPLANT
DRAPE SLUSH/WARMER DISC (DRAPES) IMPLANT
DRAPE WARM FLUID 44X44 (DRAPES) IMPLANT
DRESSING VERAFLO CLEANS CC MED (GAUZE/BANDAGES/DRESSINGS) IMPLANT
DRSG AQUACEL AG ADV 3.5X14 (GAUZE/BANDAGES/DRESSINGS) ×1 IMPLANT
DRSG TEGADERM 2-3/8X2-3/4 SM (GAUZE/BANDAGES/DRESSINGS) IMPLANT
DRSG VAC ATS LRG SENSATRAC (GAUZE/BANDAGES/DRESSINGS) ×1 IMPLANT
DRSG VAC ATS MED SENSATRAC (GAUZE/BANDAGES/DRESSINGS) ×1 IMPLANT
DRSG VAC ATS SM SENSATRAC (GAUZE/BANDAGES/DRESSINGS) ×1 IMPLANT
DRSG VERAFLO CLEANSE CC MED (GAUZE/BANDAGES/DRESSINGS) ×1
ELECT BLADE 4.0 EZ CLEAN MEGAD (MISCELLANEOUS) ×1
ELECT REM PT RETURN 9FT ADLT (ELECTROSURGICAL) ×1
ELECTRODE BLDE 4.0 EZ CLN MEGD (MISCELLANEOUS) IMPLANT
ELECTRODE REM PT RTRN 9FT ADLT (ELECTROSURGICAL) ×1 IMPLANT
GAUZE 4X4 16PLY ~~LOC~~+RFID DBL (SPONGE) ×1 IMPLANT
GAUZE PAD ABD 8X10 STRL (GAUZE/BANDAGES/DRESSINGS) IMPLANT
GAUZE SPONGE 4X4 12PLY STRL (GAUZE/BANDAGES/DRESSINGS) ×1 IMPLANT
GAUZE XEROFORM 5X9 LF (GAUZE/BANDAGES/DRESSINGS) IMPLANT
GLOVE BIO SURGEON STRL SZ7.5 (GLOVE) ×2 IMPLANT
GOWN STRL REUS W/ TWL LRG LVL3 (GOWN DISPOSABLE) ×4 IMPLANT
GOWN STRL REUS W/TWL LRG LVL3 (GOWN DISPOSABLE) ×4
HANDPIECE INTERPULSE COAX TIP (DISPOSABLE) ×2
HEMOSTAT POWDER SURGIFOAM 1G (HEMOSTASIS) IMPLANT
HEMOSTAT SURGICEL 2X14 (HEMOSTASIS) IMPLANT
KIT BASIN OR (CUSTOM PROCEDURE TRAY) ×1 IMPLANT
KIT SUCTION CATH 14FR (SUCTIONS) IMPLANT
KIT TURNOVER KIT B (KITS) ×1 IMPLANT
NS IRRIG 1000ML POUR BTL (IV SOLUTION) ×1 IMPLANT
PACK CHEST (CUSTOM PROCEDURE TRAY) ×1 IMPLANT
PACK GENERAL/GYN (CUSTOM PROCEDURE TRAY) ×1 IMPLANT
PAD ARMBOARD 7.5X6 YLW CONV (MISCELLANEOUS) ×2 IMPLANT
PAD NEG PRESSURE SENSATRAC (MISCELLANEOUS) IMPLANT
SET HNDPC FAN SPRY TIP SCT (DISPOSABLE) ×1 IMPLANT
SOL PREP POV-IOD 4OZ 10% (MISCELLANEOUS) IMPLANT
SPONGE T-LAP 18X18 ~~LOC~~+RFID (SPONGE) ×5 IMPLANT
SPONGE T-LAP 4X18 ~~LOC~~+RFID (SPONGE) ×1 IMPLANT
STAPLER VISISTAT 35W (STAPLE) IMPLANT
SUT ETHILON 3 0 FSL (SUTURE) IMPLANT
SUT STEEL 6MS V (SUTURE) IMPLANT
SUT STEEL STERNAL CCS#1 18IN (SUTURE) IMPLANT
SUT STEEL SZ 6 DBL 3X14 BALL (SUTURE) IMPLANT
SUT VIC AB 1 CTX 36 (SUTURE) ×2
SUT VIC AB 1 CTX36XBRD ANBCTR (SUTURE) ×2 IMPLANT
SUT VIC AB 2-0 CTX 27 (SUTURE) ×2 IMPLANT
SUT VIC AB 3-0 X1 27 (SUTURE) ×2 IMPLANT
SWAB COLLECTION DEVICE MRSA (MISCELLANEOUS) IMPLANT
SWAB CULTURE ESWAB REG 1ML (MISCELLANEOUS) IMPLANT
SYR 5ML LL (SYRINGE) IMPLANT
TOWEL GREEN STERILE (TOWEL DISPOSABLE) ×1 IMPLANT
TOWEL GREEN STERILE FF (TOWEL DISPOSABLE) ×1 IMPLANT
TRAY FOLEY MTR SLVR 16FR STAT (SET/KITS/TRAYS/PACK) IMPLANT
WATER STERILE IRR 1000ML POUR (IV SOLUTION) ×1 IMPLANT

## 2022-08-18 NOTE — Plan of Care (Signed)
  Problem: Education: Goal: Patient will understand all VAD equipment and how it functions Outcome: Progressing Goal: Patient will be able to verbalize current INR target range and antiplatelet therapy for discharge home Outcome: Progressing   Problem: Cardiac: Goal: LVAD will function as expected and patient will experience no clinical alarms Outcome: Progressing   Problem: Education: Goal: Knowledge of General Education information will improve Description: Including pain rating scale, medication(s)/side effects and non-pharmacologic comfort measures Outcome: Progressing   Problem: Health Behavior/Discharge Planning: Goal: Ability to manage health-related needs will improve Outcome: Progressing   Problem: Clinical Measurements: Goal: Ability to maintain clinical measurements within normal limits will improve Outcome: Progressing Goal: Will remain free from infection Outcome: Progressing Goal: Diagnostic test results will improve Outcome: Progressing Goal: Respiratory complications will improve Outcome: Progressing Goal: Cardiovascular complication will be avoided Outcome: Progressing   Problem: Activity: Goal: Risk for activity intolerance will decrease Outcome: Progressing   Problem: Nutrition: Goal: Adequate nutrition will be maintained Outcome: Progressing   Problem: Coping: Goal: Level of anxiety will decrease Outcome: Progressing   Problem: Elimination: Goal: Will not experience complications related to bowel motility Outcome: Progressing Goal: Will not experience complications related to urinary retention Outcome: Progressing   Problem: Pain Managment: Goal: General experience of comfort will improve Outcome: Progressing   Problem: Safety: Goal: Ability to remain free from injury will improve Outcome: Progressing   Problem: Skin Integrity: Goal: Risk for impaired skin integrity will decrease Outcome: Progressing   Problem: Education: Goal: Ability  to describe self-care measures that may prevent or decrease complications (Diabetes Survival Skills Education) will improve Outcome: Progressing Goal: Individualized Educational Video(s) Outcome: Progressing   Problem: Coping: Goal: Ability to adjust to condition or change in health will improve Outcome: Progressing   Problem: Fluid Volume: Goal: Ability to maintain a balanced intake and output will improve Outcome: Progressing   Problem: Health Behavior/Discharge Planning: Goal: Ability to identify and utilize available resources and services will improve Outcome: Progressing Goal: Ability to manage health-related needs will improve Outcome: Progressing   Problem: Metabolic: Goal: Ability to maintain appropriate glucose levels will improve Outcome: Progressing   Problem: Nutritional: Goal: Maintenance of adequate nutrition will improve Outcome: Progressing Goal: Progress toward achieving an optimal weight will improve Outcome: Progressing   Problem: Skin Integrity: Goal: Risk for impaired skin integrity will decrease Outcome: Progressing

## 2022-08-18 NOTE — Progress Notes (Addendum)
VAD Coordinator Procedure Note:   VAD Coordinator met patient in short stay. Pt undergoing drive line debridement and wound vac change per Dr. Darcey Nora. Hemodynamics and VAD parameters monitored by myself and anesthesia throughout the procedure. Blood pressures were obtained with automatic cuff on left arm.     Time: Non- ivBP Auto  BP Flow PI Power Speed  Pre-procedure:  1410  129/92 (105) 5 3.3 5.3 6200   1445 138/107 (116) 108/91 (98) 5.2 3.7 5.3 6200           Sedation Induction: 1500 105/96  123/108 (114) 5.3 2.5 5.2 6250   1515 128/110 (110)  5.3 2.5 5.3 6250   1530 87/77 (80) 124/99 (108) 5.5 2.2 5.2 6200   1545 101/87 (90) 92/79(85) 5.6 2.3 5.3 6200  Recovery Area: 1610  118/105 (111) 5.6 2.4 5.2 6200   1620  118/93 (102) 5.3 2.3 5.2 6250    Patient tolerated the procedure well. VAD Coordinator accompanied and remained with patient in recovery area. Wound bed measures 15.9cm x 7.6cm. Per Dr Darcey Nora Heparin can restart at 2000 and Coumadin can be given tonight. Pharmacy made aware.   Patient Disposition: Pt transported to [facility] and handoff given to [place] RN, BSN VAD Coordinator 24/7 Pager [phone]

## 2022-08-18 NOTE — Progress Notes (Signed)
[place] for Heparin Indication:  VAD  Allergies  Allergen Reactions   Aspirin Shortness Of Breath, Itching and Rash     Burning sensation (Patient reports he tolerates other NSAIDS)    Bee Venom Hives and Swelling   Lisinopril Cough   Shrimp (Diagnostic) Rash    Patient Measurements: Height: '5\' 7"'$  (170.2 cm) Weight: 129.9 kg (286 lb 6 oz) IBW/kg (Calculated) : 66.1 Heparin Dosing Weight: 98 kg  Vital Signs: Temp: 97.7 F (36.5 C) (11/14 0809) Temp Source: Oral (11/14 0809) BP: 114/89 (11/14 0809) Pulse Rate: 73 (11/14 0307)  Labs: Recent Labs    08/16/22 0550 08/17/22 0450 08/18/22 0340  HGB 12.6* 13.0 13.1  HCT 37.4* 38.7* 38.9*  PLT 279 312 300  LABPROT 13.2 12.7 13.8  INR 1.0 1.0 1.1  HEPARINUNFRC <0.10* <0.10* <0.10*  CREATININE 1.26* 1.34* 1.42*     Estimated Creatinine Clearance: 83.3 mL/min (A) (by C-G formula based on SCr of 1.42 mg/dL (H)).   Medical History: Past Medical History:  Diagnosis Date   Anxiety    Aspirin allergy    Childhood asthma    Chronic systolic CHF (congestive heart failure) ([place])    a. EF 20-25% in 2012. b. EF 45-50% in 10/2011 with nonischemic nuc - presumed NICM. c. 12/2014 Echo: Sev depressed LV fxn, sev dil LV, mild LVH, mild MR, sev dil LA, mildly reduced RV fxn.   CKD (chronic kidney disease) stage 2, GFR 60-89 ml/min    H/O vasectomy 12/2019   High cholesterol    Hypertension    Morbid obesity ([place])    Nephrolithiasis    OSA on CPAP    Paroxysmal atrial fibrillation ([place])    Presumed NICM    a. 04/2014 Myoview: EF 26%, glob HK, sev glob HK, ? prior infarct;  b. Never cathed 2/2 CKD.   Renal cell carcinoma ([place])    a. s/p Rt robotic assisted partial converted to radical nephrectomy on 01/2013.   Troponin level elevated    a. 04/2014, 12/2014: felt due to CHF.   Type II diabetes mellitus (HCC)    Ventricular tachycardia (HCC)    a. appropriate ICD therapy 12/2017     Assessment: 46 yo male with a VAD HM3, presents to clinic w/ signs of  abdominal wall cellulitis and DL infection. + drainage from site. + low grade fever 99.8.  Patient is managed as an outpatient on Warfarin therapy. Pharmacy consulted to start a heparin infusion when INR < 2.  INR 1.1. Coumadin on hold for ongoing planned debridement today. Heparin level continues to be undetectable this morning as expected on low dose heparin drip 500 units/hr with no up titration.  Had slight bleeding around surgical site post debridement but now but resolved . CBC and LDH stable.   PTA Warfarin - 8 mg daily, except 4 mg on Tues and Sat.  Goal of Therapy:  Heparin level < 0.3 INR 2-2.5 post debridement  Monitor platelets by anticoagulation protocol: Yes   Plan:  Continue IV heparin at low rate of 500 units/hr Daily heparin level and CBC. F/u plans to resume Coumadin after OR trip today?  [place] Crane, Roylene Reason, [facility] Clinical Pharmacist  08/18/2022 11:06 AM   [facility] pharmacy phone numbers are listed on [email or web address]

## 2022-08-18 NOTE — Progress Notes (Signed)
Mobility Specialist Progress Note    08/18/22 1311  Mobility  Activity Contraindicated/medical hold   Pt going for procedure. Will f/u as appropriate.   [name] Mobility Specialist  Please Psychologist, sport and exercise or Rehab Office at [phone]

## 2022-08-18 NOTE — TOC CM/SW Note (Addendum)
HF TOC CM delivered KCI wound vac to room, emailed receipt form back to [employer], [place]. Home Health arranged with [facility] and [facility], for IV Abx.  Will need OPAT order for IV abx.      [place], Heart Failure TOC CM [phone]

## 2022-08-18 NOTE — Progress Notes (Signed)
Patient refusing CPAP.

## 2022-08-18 NOTE — Anesthesia Preprocedure Evaluation (Signed)
Anesthesia Evaluation  Patient identified by MRN, date of birth, ID band Patient awake    Reviewed: Allergy & Precautions, NPO status , Patient's Chart, lab work & pertinent test results  History of Anesthesia Complications Negative for: history of anesthetic complications  Airway Mallampati: I  TM Distance: >3 FB Neck ROM: Full    Dental  (+) Teeth Intact, Dental Advisory Given   Pulmonary neg shortness of breath, asthma , sleep apnea , neg recent URI, former smoker   breath sounds clear to auscultation       Cardiovascular hypertension, Pt. on medications +CHF  + Cardiac Defibrillator  Rhythm:Regular Rate:Normal  1. Left ventricular ejection fraction, by estimation, is <20%. The left  ventricle has severely decreased function. The left ventricle demonstrates  global hypokinesis. The left ventricular internal cavity size was  moderately dilated. The septum is  shifted towards the RV.   2. Right ventricular systolic function is moderately reduced. The right  ventricular size is mildly enlarged.   3. The aortic valve opens 2/3 beats. The aortic valve is tricuspid.   4. LVAD ramp echo, the LVAD inflow cannula is not visualized.     Neuro/Psych  PSYCHIATRIC DISORDERS Anxiety        GI/Hepatic negative GI ROS, Neg liver ROS,,,  Endo/Other  diabetes  Lab Results      Component                Value               Date                      HGBA1C                   5.3                 03/16/2022             Renal/GU CRFRenal diseaseLab Results      Component                Value               Date                      CREATININE               1.24                08/12/2022                Musculoskeletal  (+) Arthritis ,    Abdominal   Peds  Hematology Lab Results      Component                Value               Date                      WBC                      10.3                08/12/2022                HGB                       13.6  08/12/2022                HCT                      41.9                08/12/2022                MCV                      91.3                08/12/2022                PLT                      188                 08/12/2022             Lab Results      Component                Value               Date                      INR                      1.2                 08/12/2022                INR                      1.4 (H)             08/11/2022                INR                      1.6 (H)             08/10/2022              Anesthesia Other Findings   Reproductive/Obstetrics                              Lab Results  Component Value Date   WBC 7.6 08/18/2022   HGB 13.1 08/18/2022   HCT 38.9 (L) 08/18/2022   MCV 89.4 08/18/2022   PLT 300 08/18/2022   Lab Results  Component Value Date   CREATININE 1.42 (H) 08/18/2022   BUN 24 (H) 08/18/2022   NA 138 08/18/2022   K 4.2 08/18/2022   CL 104 08/18/2022   CO2 27 08/18/2022    Anesthesia Physical Anesthesia Plan  ASA: 4  Anesthesia Plan: General   Post-op Pain Management: Ofirmev IV (intra-op)*   Induction: Intravenous  PONV Risk Score and Plan: 2 and Ondansetron and Dexamethasone  Airway Management Planned: Oral ETT  Additional Equipment: ClearSight  Intra-op Plan:   Post-operative Plan: Extubation in OR  Informed Consent: I have reviewed the patients History and Physical, chart, labs and discussed the procedure including the risks, benefits and alternatives for the proposed anesthesia with the patient or authorized representative who has indicated his/her understanding and acceptance.  Dental advisory given  Plan Discussed with: CRNA  Anesthesia Plan Comments: (Will start with clearsight and transition to a line if issues with hemodynamic monitoring)         Anesthesia Quick Evaluation

## 2022-08-18 NOTE — Brief Op Note (Addendum)
08/18/2022  3:59 PM  PATIENT:  Jeffrey [name]  46 y.o. male  PRE-OPERATIVE DIAGNOSIS:  DRIVELINE INFECTION  POST-OPERATIVE DIAGNOSIS:  DRIVELINE INFECTION  PROCEDURE:  Procedure(s): ABDOMINAL WOUND DEBRIDEMENT (N/A) WOUND VAC CHANGE (N/A) Excisional debridement of subcutaneous fat, rectus sheath fascia in VAD tunnel  Wound dimensions (cm) post - debridement:   16 x 7.5 x 2  SURGEON:  Surgeon(s) and Role:    Dahlia Byes, MD - Primary  PHYSICIAN ASSISTANT:   ASSISTANTS: none   ANESTHESIA:   general  EBL:  10 mL   BLOOD ADMINISTERED:none  DRAINS: none   LOCAL MEDICATIONS USED:  NONE  SPECIMEN:  Excision  DISPOSITION OF SPECIMEN:  microbiology  COUNTS:  YES  TOURNIQUET:  * No tourniquets in log *  DICTATION: .Dragon Dictation  PLAN OF CARE:  return to 2-C 07  PATIENT DISPOSITION:  PACU - hemodynamically stable.   Delay start of Pharmacological VTE agent (>24hrs) due to surgical blood loss or risk of bleeding: yes resume iv heparin 2000 hrs this pm - ok to start  gradually redosing coumadin

## 2022-08-18 NOTE — Transfer of Care (Signed)
Immediate Anesthesia Transfer of Care Note  Patient: [name]  Procedure(s) Performed: ABDOMINAL WOUND DEBRIDEMENT WOUND VAC CHANGE  Patient Location: PACU  Anesthesia Type:General  Level of Consciousness: awake and alert   Airway & Oxygen Therapy: Patient Spontanous Breathing and Patient connected to face mask oxygen  Post-op Assessment: Report given to RN and Post -op Vital signs reviewed and stable  Post vital signs: Reviewed and stable  Last Vitals:  Vitals Value Taken Time  BP 118/105 08/18/22 1606  Temp    Pulse 82 08/18/22 1612  Resp 16 08/18/22 1612  SpO2 94 % 08/18/22 1612  Vitals shown include unvalidated device data.  Last Pain:  Vitals:   08/18/22 1329  TempSrc: Oral  PainSc: 10-Worst pain ever      Patients Stated Pain Goal: 0 (35/46/56 8127)  Complications: No notable events documented.

## 2022-08-18 NOTE — Progress Notes (Signed)
Advanced Heart Failure VAD Team Note  PCP-Cardiologist: Glori Bickers, MD   Subjective:    11/6: Admitted w/ DL infection. CT with diffuse soft-tissue involvement but no overt abscess. Wound Cx growing few staph aureus. BCx NGTD.  11/8: s/p extensive debridement + wound vac. Wound Cx from OR NGTD  Feels ok. Still with some pain at vac site. No f/c/ On IV abx and IV heparin   LVAD INTERROGATION:  HeartMate III LVAD:   Flow 5.4 liters/min, speed 6200, power 5.0 , PI 2.5. VAD interrogated personally. Parameters stable.  Objective:    Vital Signs:   Temp:  [97.8 F (36.6 C)-98.5 F (36.9 C)] 97.8 F (36.6 C) (11/14 0307) Pulse Rate:  [54-88] 73 (11/14 0307) Resp:  [15-23] 20 (11/14 0307) BP: (86-128)/(66-109) 106/96 (11/14 0307) SpO2:  [95 %-96 %] 95 % (11/13 2318) Last BM Date : 08/18/22 Mean arterial Pressure 90-100  Intake/Output:   Intake/Output Summary (Last 24 hours) at 08/18/2022 0519 Last data filed at 08/18/2022 0400 Gross per 24 hour  Intake 950.9 ml  Output 675 ml  Net 275.9 ml      Physical Exam   General:  NAD.  HEENT: normal  Neck: supple. JVP not elevated.  Carotids 2+ bilat; no bruits. No lymphadenopathy or thryomegaly appreciated. Cor: LVAD hum.  Lungs: Clear. Abdomen: obese soft, mildly tender, non-distended. No hepatosplenomegaly. No bruits or masses. Good bowel sounds. Wound vac site ok Extremities: no cyanosis, clubbing, rash. Warm no edema  Neuro: alert & oriented x 3. No focal deficits. Moves all 4 without problem    Telemetry   NSR 70-80 Personally reviewed  Labs   Basic Metabolic Panel: Recent Labs  Lab 08/11/22 0655 08/11/22 0656 08/12/22 0436 08/13/22 0659 08/14/22 0427 08/15/22 0016 08/16/22 0550 08/17/22 0450 08/18/22 0340  NA  --    < > 138   < > 137 137 139 137 138  K  --    < > 4.4   < > 4.9 4.5 4.4 4.3 4.2  CL  --    < > 109   < > 109 108 106 104 104  CO2  --    < > 21*   < > '25 24 26 26 27  '$ GLUCOSE  --    <  > 95   < > 92 102* 102* 99 107*  BUN  --    < > 17   < > '20 19 17 19 '$ 24*  CREATININE  --    < > 1.24   < > 1.19 1.32* 1.26* 1.34* 1.42*  CALCIUM  --    < > 8.8*   < > 9.1 9.6 10.0 10.0 10.4*  MG 1.4*  --  2.1  --   --   --   --   --   --    < > = values in this interval not displayed.     Liver Function Tests: No results for input(s): "AST", "ALT", "ALKPHOS", "BILITOT", "PROT", "ALBUMIN" in the last 168 hours.  No results for input(s): "LIPASE", "AMYLASE" in the last 168 hours. No results for input(s): "AMMONIA" in the last 168 hours.  CBC: Recent Labs  Lab 08/14/22 0427 08/15/22 0016 08/16/22 0550 08/17/22 0450 08/18/22 0340  WBC 7.1 7.0 7.3 7.2 7.6  HGB 12.7* 12.3* 12.6* 13.0 13.1  HCT 38.2* 37.3* 37.4* 38.7* 38.9*  MCV 91.6 90.1 90.1 90.2 89.4  PLT 230 248 279 312 300     INR: Recent Labs  Lab 08/14/22 0427 08/15/22 0016 08/16/22 0550 08/17/22 0450 08/18/22 0340  INR 1.1 1.1 1.0 1.0 1.1     Other results:   Imaging   No results found.   Medications:     Scheduled Medications:  allopurinol  100 mg Oral Daily   amiodarone  200 mg Oral Daily   amLODipine  10 mg Oral Daily   Chlorhexidine Gluconate Cloth  6 each Topical Daily   colchicine  0.6 mg Oral Daily   doxazosin  1 mg Oral Daily   gabapentin  300 mg Oral TID   insulin aspart  0-9 Units Subcutaneous TID WC   isosorbide-hydrALAZINE  2 tablet Oral TID   metFORMIN  1,000 mg Oral BID WC   multivitamin with minerals  1 tablet Oral Daily   nutrition supplement (JUVEN)  1 packet Oral BID BM   pantoprazole  40 mg Oral Daily   sacubitril-valsartan  1 tablet Oral BID   sertraline  150 mg Oral Daily   sodium chloride flush  10-40 mL Intracatheter Q12H   spironolactone  25 mg Oral Daily   traZODone  100 mg Oral QHS    Infusions:  sodium chloride 10 mL/hr at 08/14/22 2321    ceFAZolin (ANCEF) IV 2 g (08/17/22 2148)    ceFAZolin (ANCEF) IV     heparin 500 Units/hr (08/18/22 0307)    PRN  Medications: sodium chloride, acetaminophen, hydrALAZINE, morphine injection, naloxone, mouth rinse, oxyCODONE, sodium chloride flush, traMADol   Patient Profile   46 y.o. male with a history of  poorly controlled HTN, R renal cell carcinoma s/p nephrectomy, CKD IIIa, DM2, OSA, gout, morbid obesity and systolic HF due to NICM. 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    Assessment/Plan:     1. DL infection/ Abdominal Wall Cellulitis  - prior DL infection 3/10, wound cx + rare staph aureus, resolved w/ outpatient abx  - now w/ recurrent infection  - wound Cx + staph aureus, blood cx NGTD - CT with diffuse soft-tissue involvement but no overt abscess - ID following, abx narrowed to cefazolin  - s/p debridement + wound vac 11/8. Plan return to OR this am  - continue IV abx  2.  Chronic systolic CHF  - Nonischemic CM, end-stage  - EF 2012; 20-25%  - Echo  8/22: EF < 20%, severe LV dilation, moderate RV dysfunction.   - RHC (8/22): well compensated hemodynamics.  - s/p S-ICD  - S/p HM3 LVAD 10/17 (DT)  - Continue Bidil 2 tabs tid  - Continue spironolactone 25 mg daily  - Continue entresto 97/103 bid  - Resume ozempic after discharge.  - Volume status stable. Holding diuretics  3. VAD  - s/p HM-3 placement 07/21/21  - VAD interrogated personally. Parameters stable.] - Holding Coumadin for debridement of DL. - LDH ok - Remains on heparin gtt.  - Continue heparin. Will discuss timing of restarting warfarin with Dr. Prescott Gum     4 Atrial fibrillation: Paroxysmal  - Had severe AF with RVR 7/22 s/p DC-CV x 2  - Seen by EP. Not a candidate for ablation due to body habitus  (could consider AVN ablation and CRT, if needed)  - Had post-op AF with RVR post VAD  - 10/23 S/P DC-CV with restoration of NSR.  - Remains in NSR  Continue amio 200 daily  - Hold coumadin per above. Continue heparin gtt    5. HTN  -  MAPs  ok today - If MAP >  100 can give IV hydralazine prn   6. CKD stage 3B  - Has solitary kidney.   - Baseline creatinine is 1.5-1.8.  - sCr 1.42 today - holding diuretic  7. OSA  - Continue CPAP    8. DM2  - On Jardiance.  - Hgb A1c 9.4. -> 5.9  - PCP following    9. Polyarticular gout  - quiescent    10. Depression/anxiety  - Continue Zoloft '150mg'$  daily    11. Morbid obesity  -Body mass index is 45.48 kg/m. - Hold GLP1RA for OR (last dose 11/2)   I reviewed the LVAD parameters from today, and compared the results to the patient's prior recorded data.  No programming changes were made.  The LVAD is functioning within specified parameters.  The patient performs LVAD self-test daily.  LVAD interrogation was negative for any significant power changes, alarms or PI events/speed drops.  LVAD equipment check completed and is in good working order.  Back-up equipment present.   LVAD education done on emergency procedures and precautions and reviewed exit site care.  Length of Stay: 8  Glori Bickers, MD  5:24 AM  VAD Team --- VAD ISSUES ONLY--- Pager [phone] (7am - 7am)  Advanced Heart Failure Team  Pager [phone] (M-F; 7a - 5p)  Please contact [facility] for night-coverage after hours (5p -7a ) and weekends on [email or web address]

## 2022-08-18 NOTE — Op Note (Unsigned)
Jeffrey [name], Jeffrey [name] MEDICAL RECORD NO: [id number] ACCOUNT NO: [id number] DATE OF BIRTH: 10-04-1976 FACILITY: [facility] LOCATION: [facility] PHYSICIAN: Ivin Poot III, MD  Operative Report   DATE OF PROCEDURE: 08/18/2022  OPERATION:   1.  Excisional debridement of abdominal wound (excision of subcutaneous fat and rectus sheath fascia), pulse lavage irrigation of wound. 2.  Wound VAC change (post-debridement wound dimensions 16 cm long x 8 cm wide x 2 cm deep).  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  ANESTHESIA:  General.  DESCRIPTION OF PROCEDURE:  The patient was seen in preoperative holding where informed consent was documented and final issues regarding the procedure were discussed with the patient.  The VAD coordinator accompanying the patient from preoperative  holding into the operating room and was present during the entire procedure to run the VAD equipment and to assist with hemodynamic management.  The patient was brought from preoperative holding into operating room and placed supine on the operating table.  General anesthesia was induced and the patient was intubated.  He had noninvasive monitoring of blood pressure and oxygen saturation and EKG  rhythm.  The wound VAC sheets and sponge were removed after the patient was asleep.  The abdomen and chest were prepped and draped as a sterile field.  A proper timeout was performed.  The wound was examined.  It was from the skin down to the rectus sheath in a semilunar configuration corresponding to the power cord tunnel.  The wound had no purulence, no pockets of fluid and was clean.  There was a slight tunnel at the superior aspect  of the incision which required an extension only 1 inch in the incision to open the space up.  Sharp debridement was then performed of the subcutaneous fat  and rectus sheath. Wound irrigation and pulse lavage was performed with vancomycin of the wound  and the lower coating of the power cord.  The wound VAC sponge was then cut to the appropriate size and configuration, placed in the depths of the wound and covered with a wound VAC sheets and then the wound VAC suction line.  There was good compression  of the sponge and no air leak with initiation of the wound VAC system.  The patient was then reversed from anesthesia and returned to recovery room in stable condition.  Blood loss was minimal.   VAI D: 08/18/2022 4:51:04 pm T: 08/18/2022 9:16:00 pm  JOB: [id number]/ [id number]

## 2022-08-18 NOTE — Anesthesia Procedure Notes (Signed)
Procedure Name: Intubation Date/Time: 08/18/2022 3:04 PM  Performed by: Reece Agar, CRNAPre-anesthesia Checklist: Patient identified, Emergency Drugs available, Suction available and Patient being monitored Patient Re-evaluated:Patient Re-evaluated prior to induction Oxygen Delivery Method: Circle System Utilized Preoxygenation: Pre-oxygenation with 100% oxygen Induction Type: IV induction Ventilation: Mask ventilation without difficulty Laryngoscope Size: Mac and 4 Grade View: Grade I Tube type: Oral Tube size: 7.5 mm Number of attempts: 1 Airway Equipment and Method: Stylet Placement Confirmation: ETT inserted through vocal cords under direct vision, positive ETCO2 and breath sounds checked- equal and bilateral Secured at: 23 cm Tube secured with: Tape Dental Injury: Teeth and Oropharynx as per pre-operative assessment

## 2022-08-18 NOTE — Progress Notes (Signed)
Pre Procedure note for inpatients:   Jeffrey [name] has been scheduled for Procedure(s): ABDOMINAL WOUND DEBRIDEMENT (N/A) WOUND VAC CHANGE (N/A) today. The various methods of treatment have been discussed with the patient. After consideration of the risks, benefits and treatment options the patient has consented to the planned procedure.   The patient has been seen and labs reviewed. There are no changes in the patient's condition to prevent proceeding with the planned procedure today.  Recent labs:  Lab Results  Component Value Date   WBC 7.6 08/18/2022   HGB 13.1 08/18/2022   HCT 38.9 (L) 08/18/2022   PLT 300 08/18/2022   GLUCOSE 107 (H) 08/18/2022   CHOL 174 06/10/2021   TRIG 120 06/10/2021   HDL 37 (L) 06/10/2021   LDLCALC 115 (H) 06/10/2021   ALT 16 08/10/2022   AST 19 08/10/2022   NA 138 08/18/2022   K 4.2 08/18/2022   CL 104 08/18/2022   CREATININE 1.42 (H) 08/18/2022   BUN 24 (H) 08/18/2022   CO2 27 08/18/2022   TSH 2.696 08/05/2022   INR 1.1 08/18/2022   HGBA1C 5.3 03/16/2022   MICROALBUR '150mg'$  02/17/2017    Dahlia Byes, MD 08/18/2022 8:34 AM

## 2022-08-18 NOTE — Plan of Care (Signed)
  Problem: Education: Goal: Patient will understand all VAD equipment and how it functions Outcome: Progressing Goal: Patient will be able to verbalize current INR target range and antiplatelet therapy for discharge home Outcome: Progressing   Problem: Cardiac: Goal: LVAD will function as expected and patient will experience no clinical alarms Outcome: Progressing   Problem: Education: Goal: Knowledge of General Education information will improve Description: Including pain rating scale, medication(s)/side effects and non-pharmacologic comfort measures Outcome: Progressing   Problem: Health Behavior/Discharge Planning: Goal: Ability to manage health-related needs will improve Outcome: Progressing   Problem: Clinical Measurements: Goal: Ability to maintain clinical measurements within normal limits will improve Outcome: Progressing Goal: Respiratory complications will improve Outcome: Progressing   Problem: Activity: Goal: Risk for activity intolerance will decrease Outcome: Progressing   Problem: Nutrition: Goal: Adequate nutrition will be maintained Outcome: Progressing   Problem: Coping: Goal: Level of anxiety will decrease Outcome: Progressing   Problem: Pain Managment: Goal: General experience of comfort will improve Outcome: Progressing   Problem: Safety: Goal: Ability to remain free from injury will improve Outcome: Progressing   Problem: Skin Integrity: Goal: Risk for impaired skin integrity will decrease Outcome: Progressing   Problem: Coping: Goal: Ability to adjust to condition or change in health will improve Outcome: Progressing   Problem: Fluid Volume: Goal: Ability to maintain a balanced intake and output will improve Outcome: Progressing   Problem: Metabolic: Goal: Ability to maintain appropriate glucose levels will improve Outcome: Progressing   Problem: Skin Integrity: Goal: Risk for impaired skin integrity will decrease Outcome:  Progressing   Problem: Tissue Perfusion: Goal: Adequacy of tissue perfusion will improve Outcome: Progressing

## 2022-08-18 NOTE — Progress Notes (Signed)
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 Cyndia Bent under Destination Therapy criteria due to BMI and chronic kidney disease.    Pt laying in bed. Pt c/o pain in driveline/wound vac site, but states PRN pain medications are working.   Plan to return to OR for further debridement/wound vac this afternoon with Dr Prescott Gum. Currently on Cefazolin 2g Q 8 hrs per ID.   Pt will need wound vac and home IV antibiotics at hospital discharge. Alesia CM is working on both of these.   Vital signs: Temp:  97.7 HR: 78 NSR Doppler: 94 Auto cuff: 114/89 (98) O2 Sat: 94% on RA Wt: 298>300.9>300.4>300.9>290.4>286.4 lbs   LVAD interrogation reveals:  Speed: 6200 Flow: 5.3 Power:  5.2 w PI: 2.6 Hct: 41   Alarms:  none Events: 1 PI event so far today   Fixed speed:  6200 Low speed limit: 5900   Drive Line:  Wound vac in place suction at -125. Good seal achieved. No leak or blockage alarms noted. Anchor correctly applied. Scant serosanguinous drainage. Plan for washout/wound vac change in OR this afternoon per Dr Prescott Gum.   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 Call VAD coordinator if any wound vac issues. Plan for wound vac change in OR with Dr Prescott Gum 08/18/22  [name] RN [place] Coordinator  Office: [phone]  24/7  Pager: [phone]

## 2022-08-18 NOTE — Progress Notes (Signed)
[facility] for Heparin & Coumadin Indication:  VAD  Allergies  Allergen Reactions   Aspirin Shortness Of Breath, Itching and Rash     Burning sensation (Patient reports he tolerates other NSAIDS)    Bee Venom Hives and Swelling   Lisinopril Cough   Shrimp (Diagnostic) Rash    Patient Measurements: Height: '5\' 7"'$  (170.2 cm) Weight: 129.9 kg (286 lb 6 oz) IBW/kg (Calculated) : 66.1 Heparin Dosing Weight: 98 kg  Vital Signs: Temp: 98.7 F (37.1 C) (11/14 1630) Temp Source: Oral (11/14 1329) BP: 114/103 (11/14 1630) Pulse Rate: 74 (11/14 1630)  Labs: Recent Labs    08/16/22 0550 08/17/22 0450 08/18/22 0340  HGB 12.6* 13.0 13.1  HCT 37.4* 38.7* 38.9*  PLT 279 312 300  LABPROT 13.2 12.7 13.8  INR 1.0 1.0 1.1  HEPARINUNFRC <0.10* <0.10* <0.10*  CREATININE 1.26* 1.34* 1.42*   Estimated Creatinine Clearance: 83.3 mL/min (A) (by C-G formula based on SCr of 1.42 mg/dL (H)).  Assessment: 46 yo male with a VAD HM3, presents to clinic w/ signs of  abdominal wall cellulitis and DL infection. + drainage from site. + low grade fever 99.8.  Patient is managed as an outpatient on Warfarin therapy. Pharmacy consulted to start a heparin infusion when INR < 2.  INR 1.1. Coumadin on hold for ongoing planned debridement today. Heparin level continues to be undetectable this morning as expected on low dose heparin drip 500 units/hr with no up titration.  Had slight bleeding around surgical site post debridement but now but resolved . CBC and LDH stable.   PTA Warfarin - 8 mg daily, except 4 mg on Tues and Sat.  PM Update: Post debridement. Per VAD coordinator, ok to resume Warfarin tonight per Dr. Prescott Gum (in op note) and Dr. [name]. Heparin to resume at low rate of 500 units/hr at 2000 PM.   Goal of Therapy:  Heparin level < 0.3 INR 2-2.5 post debridement  Monitor platelets by anticoagulation protocol: Yes   Plan:  Continue IV heparin at low  rate of 500 units/hr as ordered.  Restart Warfarin '4mg'$  po x1 tonight (home dose for Tuesday) Daily PT/INR, heparin level, and CBC.  Sloan Leiter, PharmD, BCPS, BCCCP Clinical Pharmacist Please refer to [facility] for [place] numbers  08/18/2022 5:10 PM

## 2022-08-19 ENCOUNTER — Encounter (HOSPITAL_COMMUNITY): Payer: Self-pay | Admitting: Cardiothoracic Surgery

## 2022-08-19 DIAGNOSIS — T827XXA Infection and inflammatory reaction due to other cardiac and vascular devices, implants and grafts, initial encounter: Secondary | ICD-10-CM | POA: Diagnosis not present

## 2022-08-19 DIAGNOSIS — T8189XA Other complications of procedures, not elsewhere classified, initial encounter: Secondary | ICD-10-CM | POA: Diagnosis not present

## 2022-08-19 LAB — LACTATE DEHYDROGENASE: LDH: 186 U/L (ref 98–192)

## 2022-08-19 LAB — CBC
HCT: 39.3 % (ref 39.0–52.0)
Hemoglobin: 13.1 g/dL (ref 13.0–17.0)
MCH: 30 pg (ref 26.0–34.0)
MCHC: 33.3 g/dL (ref 30.0–36.0)
MCV: 89.9 fL (ref 80.0–100.0)
Platelets: 322 10*3/uL (ref 150–400)
RBC: 4.37 MIL/uL (ref 4.22–5.81)
RDW: 14.2 % (ref 11.5–15.5)
WBC: 10.7 10*3/uL — ABNORMAL HIGH (ref 4.0–10.5)
nRBC: 0 % (ref 0.0–0.2)

## 2022-08-19 LAB — BASIC METABOLIC PANEL
Anion gap: 8 (ref 5–15)
BUN: 31 mg/dL — ABNORMAL HIGH (ref 6–20)
CO2: 25 mmol/L (ref 22–32)
Calcium: 10.1 mg/dL (ref 8.9–10.3)
Chloride: 105 mmol/L (ref 98–111)
Creatinine, Ser: 1.5 mg/dL — ABNORMAL HIGH (ref 0.61–1.24)
GFR, Estimated: 57 mL/min — ABNORMAL LOW (ref >60)
Glucose, Bld: 128 mg/dL — ABNORMAL HIGH (ref 70–99)
Potassium: 4.4 mmol/L (ref 3.5–5.1)
Sodium: 138 mmol/L (ref 135–145)

## 2022-08-19 LAB — HEPARIN LEVEL (UNFRACTIONATED): Heparin Unfractionated: <0.1 IU/mL — ABNORMAL LOW (ref 0.30–0.70)

## 2022-08-19 LAB — GLUCOSE, CAPILLARY
Glucose-Capillary: 125 mg/dL — ABNORMAL HIGH (ref 70–99)
Glucose-Capillary: 126 mg/dL — ABNORMAL HIGH (ref 70–99)
Glucose-Capillary: 122 mg/dL — ABNORMAL HIGH (ref 70–99)
Glucose-Capillary: 127 mg/dL — ABNORMAL HIGH (ref 70–99)

## 2022-08-19 LAB — PROTIME-INR
INR: 1 (ref 0.8–1.2)
Prothrombin Time: 13.4 seconds (ref 11.4–15.2)

## 2022-08-19 MED ORDER — WARFARIN SODIUM 4 MG PO TABS
8.0000 mg | ORAL_TABLET | Freq: Once | ORAL | Status: AC
  Administered 2022-08-19: 8 mg via ORAL
  Filled 2022-08-19: qty 2

## 2022-08-19 MED ORDER — HEPARIN (PORCINE) 25000 UT/250ML-% IV SOLN
500.0000 [IU]/h | INTRAVENOUS | Status: DC
  Administered 2022-08-19 – 2022-08-25 (×4): 500 [IU]/h via INTRAVENOUS
  Filled 2022-08-19 (×4): qty 250

## 2022-08-19 NOTE — Plan of Care (Signed)
  Problem: Education: Goal: Patient will understand all VAD equipment and how it functions Outcome: Progressing Goal: Patient will be able to verbalize current INR target range and antiplatelet therapy for discharge home Outcome: Progressing   Problem: Cardiac: Goal: LVAD will function as expected and patient will experience no clinical alarms Outcome: Progressing   Problem: Education: Goal: Knowledge of General Education information will improve Description: Including pain rating scale, medication(s)/side effects and non-pharmacologic comfort measures Outcome: Progressing   Problem: Health Behavior/Discharge Planning: Goal: Ability to manage health-related needs will improve Outcome: Progressing   Problem: Clinical Measurements: Goal: Ability to maintain clinical measurements within normal limits will improve Outcome: Progressing Goal: Will remain free from infection Outcome: Progressing Goal: Diagnostic test results will improve Outcome: Progressing Goal: Respiratory complications will improve Outcome: Progressing Goal: Cardiovascular complication will be avoided Outcome: Progressing   Problem: Activity: Goal: Risk for activity intolerance will decrease Outcome: Progressing   Problem: Nutrition: Goal: Adequate nutrition will be maintained Outcome: Progressing   Problem: Coping: Goal: Level of anxiety will decrease Outcome: Progressing   Problem: Elimination: Goal: Will not experience complications related to bowel motility Outcome: Progressing Goal: Will not experience complications related to urinary retention Outcome: Progressing   Problem: Pain Managment: Goal: General experience of comfort will improve Outcome: Progressing   Problem: Safety: Goal: Ability to remain free from injury will improve Outcome: Progressing   Problem: Skin Integrity: Goal: Risk for impaired skin integrity will decrease Outcome: Progressing   Problem: Education: Goal: Ability  to describe self-care measures that may prevent or decrease complications (Diabetes Survival Skills Education) will improve Outcome: Progressing Goal: Individualized Educational Video(s) Outcome: Progressing   Problem: Coping: Goal: Ability to adjust to condition or change in health will improve Outcome: Progressing   Problem: Fluid Volume: Goal: Ability to maintain a balanced intake and output will improve Outcome: Progressing   Problem: Health Behavior/Discharge Planning: Goal: Ability to identify and utilize available resources and services will improve Outcome: Progressing Goal: Ability to manage health-related needs will improve Outcome: Progressing   Problem: Metabolic: Goal: Ability to maintain appropriate glucose levels will improve Outcome: Progressing   Problem: Nutritional: Goal: Maintenance of adequate nutrition will improve Outcome: Progressing Goal: Progress toward achieving an optimal weight will improve Outcome: Progressing

## 2022-08-19 NOTE — Progress Notes (Signed)
Nutrition Follow-up  DOCUMENTATION CODES:   Obesity unspecified  INTERVENTION:   - Continue Juven BID (give with 8 oz apple juice for mixing), each packet provides 80 calories, 8 grams of carbohydrate, 2.5  grams of protein, 7 grams of L-arginine and 7 grams of L-glutamine; supplement contains CaHMB, vitamins C, E, B12 and zinc to promote wound healing   - Continue double protein portions at meal times   - Continue MVI with minerals daily   - Continue liberalized Heart Healthy diet   NUTRITION DIAGNOSIS:   Increased nutrient needs related to wound healing, acute illness as evidenced by estimated needs.  Ongoing, being addressed via oral nutrition supplements  GOAL:   Patient will meet greater than or equal to 90% of their needs  Progressing  MONITOR:   PO intake, Supplement acceptance, Labs, Weight trends, Skin  REASON FOR ASSESSMENT:   Rounds    ASSESSMENT:   46 yo male admitted with abdominal wall cellulitis and drive line infection. PMH includes LVAD HM III (07/21/2021), CKD stage 2, HTN, renal cell carcinoma with nephrectomy in 2014, CHF  11/06 - admitted 11/08 - s/p excisional debridement of VAD drive line tunnel infection with excision of infected tissues including skin, subcutaneous fat, abdominal fascia, wound VAC placement 11/14 - s/p abdominal wound debridement, wound VAC change  Spoke with pt, wife, and cousin at bedside. Pt in good spirits at time of RD visit despite reports of having a rough morning. Pt states that he is waiting for his lunch to arrive and that someone has sent him some steak via [employer]. Pt reports having a good breakfast which consisted of 1.5 English muffin sandwiches with egg and cheese, 100% of [place] sausage, and 50% of breakfast potatoes.  Pt reports that he was drinking the Juven but hasn't had one since Friday. Pt's family members in room were not pleased to hear this and report that they are going to make sure pt drinks the  Juven. Pt reports not liking the taste or texture of the Juven. RD provided pt with a Juven packet which RD mixed in 8 oz of apple juice with ice. Pt consumed 100% and tolerated well. He reports that this tastes better than Juven mixed in plain water. Will adjust orders so that Juven is given with 8 oz of apple juice to promote acceptance.  Explained the need for both adequate kcal and protein intake as well as oral nutrition supplement consumption given Juven has essential vitamins/minerals for wound healing in particular. Pt and family express understanding.  Admit weight: 135.1 kg Current weight: 129.6 kg  Meal Completion: 40-100%  Medications reviewed and include: SSI, metformin, MVI with minerals, Juven BID, protonix, spironolactone, warfarin, zinc sulfate 220 mg daily, IV abx, heparin drip  Labs reviewed: WBC 10.7 CBG's: 105-169 x 24 hours  UOP: 525 ml x 24 hours VAC: 150 ml x 24 hours I/O's: +1.5 L since admit  NUTRITION - FOCUSED PHYSICAL EXAM:  Flowsheet Row Most Recent Value  Orbital Region No depletion  Upper Arm Region No depletion  Thoracic and Lumbar Region No depletion  Buccal Region No depletion  Temple Region No depletion  Clavicle Bone Region No depletion  Clavicle and Acromion Bone Region No depletion  Scapular Bone Region No depletion  Dorsal Hand No depletion  Patellar Region No depletion  Anterior Thigh Region No depletion  Posterior Calf Region No depletion  Edema (RD Assessment) Mild  Hair Reviewed  Eyes Reviewed  Mouth Reviewed  Skin Reviewed  Nails Reviewed       Diet Order:   Diet Order             Diet Heart Room service appropriate? Yes; Fluid consistency: Thin  Diet effective now                   EDUCATION NEEDS:   Education needs have been addressed  Skin:  Skin Assessment: Skin Integrity Issues: Wound VAC: drive line infection-debridment Other: cellulitis to abdomen  Last BM:  08/18/22  Height:   Ht Readings from  Last 1 Encounters:  08/14/22 '5\' 7"'$  (1.702 m)    Weight:   Wt Readings from Last 1 Encounters:  08/19/22 129.6 kg   BMI:  Body mass index is 44.75 kg/m.  Estimated Nutritional Needs:   Kcal:  2000-2200 kcals  Protein:  115-130 grams  Fluid:  1.8 L    Gustavus Bryant, MS, RD, LDN Inpatient Clinical Dietitian Please see [facility] for contact information.

## 2022-08-19 NOTE — Progress Notes (Signed)
[facility] for Heparin & Coumadin Indication:  VAD  Allergies  Allergen Reactions   Aspirin Shortness Of Breath, Itching and Rash     Burning sensation (Patient reports he tolerates other NSAIDS)    Bee Venom Hives and Swelling   Lisinopril Cough   Shrimp (Diagnostic) Rash    Patient Measurements: Height: '5\' 7"'$  (170.2 cm) Weight: 129.6 kg (285 lb 11.5 oz) IBW/kg (Calculated) : 66.1 Heparin Dosing Weight: 98 kg  Vital Signs: Temp: 98.1 F (36.7 C) (11/15 0752) Temp Source: Oral (11/15 0752) BP: 113/89 (11/15 0752) Pulse Rate: 79 (11/15 0752)  Labs: Recent Labs    08/17/22 0450 08/18/22 0340 08/19/22 0540  HGB 13.0 13.1 13.1  HCT 38.7* 38.9* 39.3  PLT 312 300 322  LABPROT 12.7 13.8 13.4  INR 1.0 1.1 1.0  HEPARINUNFRC <0.10* <0.10* <0.10*  CREATININE 1.34* 1.42*  --     Estimated Creatinine Clearance: 83.2 mL/min (A) (by C-G formula based on SCr of 1.42 mg/dL (H)).  Assessment: 46 yo male with a VAD HM3, presents to clinic w/ signs of  abdominal wall cellulitis and DL infection. + drainage from site. + low grade fever 99.8.  Patient is managed as an outpatient on Warfarin therapy. Pharmacy consulted to start a heparin infusion when INR < 2.  INR 1.0. Heparin level is undetectable on heparin infusion at 500 units/hr. Warfarin restarted on 11/14. Had slight bleeding around surgical site (stitch added 11/15). Hgb 13s, plt WNL, LDH stable at 186.   PTA Warfarin - 8 mg daily, except 4 mg on Tues and Sat.  Goal of Therapy:  Heparin level < 0.3 INR 2-2.5 post debridement  Monitor platelets by anticoagulation protocol: Yes   Plan:  Continue IV heparin at low rate of 500 units/hr as ordered.  Restart Warfarin 8 mg po x1 tonight. Daily PT/INR, heparin level, and CBC.  Antonietta Jewel, PharmD, [place] Clinical Pharmacist  Phone: [phone] 08/19/2022 8:15 AM  Please check AMION for all [place] phone numbers After 10:00 PM, call  [place] [phone]

## 2022-08-19 NOTE — Progress Notes (Signed)
PHARMACY CONSULT NOTE FOR:  OUTPATIENT  PARENTERAL ANTIBIOTIC THERAPY (OPAT)  Indication: LVAD deep driveline infection Regimen: Cefazolin 2 g IV every 8 hours End date: 09/28/2022  IV antibiotic discharge orders are pended. To discharging provider:  please sign these orders via discharge navigator,  Select New Orders & click on the button choice - Manage This Unsigned Work.     Thank you for allowing pharmacy to be a part of this patient's care.  Jeneen Rinks 11/15/1550, 0:80 AM

## 2022-08-19 NOTE — Discharge Summary (Addendum)
Advanced Heart Failure Team  Discharge Summary   Patient ID: Jeffrey [name] MRN: [id number], DOB/AGE: October 27, 1975 46 y.o. Admit date: 08/10/2022 D/C date:     08/26/2022   Primary Discharge Diagnoses:  Driveline infection / abdominal wall cellulitis  Secondary Discharge Diagnoses:  Chronic systolic CHF VAD Atrial fibrillation Hypertension CKD st 3B OSA DM2 Polyarticular gout Depression/anxiety Morbid obesity  Hospital Course:  Jeffrey [name] is a 46 y.o. male with a history of  poorly controlled HTN, R renal cell carcinoma s/p nephrectomy, CKD IIIa, DM2, OSA, gout, morbid obesity and systolic HF due to NICM, w/ [place]-[employer] S-ICD . S/p HM-3 VAD placement 07/21/21   Pt with history of driveline infection recently 3/23, wound grew staph aureus, treated with doxy and Augmentin. IV ceftriaxone x1 in clinic. Infection resolved.   Presented 11/6 to clinic with signs of abdominal wall cellulitis and DL infection. + drainage from site. + low grade fever 99.8 and reported chills. Direct admitted to hospital. CT showed diffuse soft-tissue involvement but no overt abscess. Wound cultures were + for staph aureus again.   Underwent I&D with wound vac placement 11/8 and 11/14. 11/15 experienced bleeding from site requiring bedside intervention. Bleeding controled. Per ID: Cefazolin, 6 weeks from OR(11/14), EOT 12/25 then transition to cefadroxil 500mg  PO bid for chronic suppression. Will d/c with PICC, [facility] set up. Wound vac delivered to bedside.   INR 2 at discharge, INR check scheduled for Monday. F/u for wound vac changes scheduled. All updates given to patient.   Pt will continue to be followed closely in the VAD/AHF clinic. Dr Jeffrey [name] evaluated and deemed appropriate for discharge.   See below for detailed problem list: 1. DL infection/ Abdominal Wall Cellulitis  - prior DL infection 01/980, wound cx + rare staph aureus, resolved w/ outpatient abx  - now w/ recurrent infection   - wound Cx + for staph aureus, blood cx NGTD - CT with diffuse soft-tissue involvement but no overt abscess - s/p debridement + wound vac 11/8, and 11/14. Bedside wound vac change and vessel cauterization 11/15 2/2 hematoma.  -  discharge with IV cefazolin through PICC line (6 weeks from 11/14) then transition to cefadroxil 500 mg PO bid for chronic suppression per ID. VAC will be monitored through VAD clinic. Dr. Maren Beach following. - Home wound vac in room and [facility] arranged - Site stable. Afebrile - s/p Wound Vac change 11/20 at bedside 2.  Chronic systolic CHF  - Nonischemic CM, end-stage  - EF 2012; 20-25%  - Echo  8/22: EF < 20%, severe LV dilation, moderate RV dysfunction.   - RHC (8/22): well compensated hemodynamics.  - s/p S-ICD  - S/p HM3 LVAD 10/17 (DT)  - Volume status stable, continue PRN torsemide at discharge - Continue Bidil 2 tabs tid  - Continue spironolactone 25 mg daily  - Continue entresto 97/103 bid  - Resume ozempic after discharge.  3. VAD  - s/p HM-3 placement 07/21/21  - VAD interrogated personally. Parameters stable. - LDH ok - Stop heparin gtt, Continue coumadin per pharmacy. INR 1.4 -> 1.5 > 1.6 >2 today.  4 Atrial fibrillation: Paroxysmal  - Had AF with RVR 7/22 s/p DC-CV x 2  - Seen by EP. Not a candidate for ablation due to body habitus  (could consider AVN ablation and CRT, if needed)  - Had post-op AF with RVR post VAD  - 10/23 S/P DC-CV with restoration of NSR.  - IN SR. Continue  amio 200 daily  - On coumadin 5. HTN  - MAPs improved on increased doxazosin dose  6. CKD stage 3B  - Has solitary kidney.   - Baseline creatinine is 1.5-1.8.  - Scr 1.5 7. OSA  - Continue CPAP  8. DM2  - On Jardiance.  - Hgb A1c 9.4. -> 5.9  - PCP following  9. Polyarticular gout  - quiescent  10. Depression/anxiety  - Continue Zoloft 150mg  daily  11. Morbid obesity  -Body mass index is 44.96 kg/m. - Holding GLP1RA for OR (last dose  11/2)   Discharge Weight Range: 130.2kg Discharge Vitals: Blood pressure (!) 114/96, pulse 72, temperature 97.9 F (36.6 C), temperature source Oral, resp. rate 19, height 5\' 7"  (1.702 m), weight 130.2 kg, SpO2 97 %.  Labs: Lab Results  Component Value Date   WBC 7.8 08/26/2022   HGB 12.0 (L) 08/26/2022   HCT 37.5 (L) 08/26/2022   MCV 91.9 08/26/2022   PLT 282 08/26/2022    Recent Labs  Lab 08/26/22 0545  NA 139  K 4.6  CL 105  CO2 25  BUN 37*  CREATININE 1.53*  CALCIUM 9.9  GLUCOSE 98   Lab Results  Component Value Date   CHOL 174 06/10/2021   HDL 37 (L) 06/10/2021   LDLCALC 115 (H) 06/10/2021   TRIG 120 06/10/2021   BNP (last 3 results) Recent Labs    05/19/22 1457  BNP 47.0    ProBNP (last 3 results) No results for input(s): "PROBNP" in the last 8760 hours.   Diagnostic Studies/Procedures   No results found.  Discharge Medications   Allergies as of 08/26/2022       Reactions   Aspirin Shortness Of Breath, Itching, Rash    Burning sensation (Patient reports he tolerates other NSAIDS)    Bee Venom Hives, Swelling   Lisinopril Cough   Shrimp (diagnostic) Rash        Medication List     STOP taking these medications    enoxaparin 60 MG/0.6ML injection Commonly known as: LOVENOX   Insulin Syringe-Needle U-100 31G X 5/16" 0.5 ML Misc Commonly known as: TRUEplus Insulin Syringe Replaced by: Insulin Syringe-Needle U-100 30G X 1/2" 1 ML Misc   NovoLOG Mix 70/30 FlexPen (70-30) 100 UNIT/ML FlexPen Generic drug: insulin aspart protamine - aspart       TAKE these medications    Accu-Chek FastClix Lancets Misc USE AS DIRECTED FOUR TIMES DAILY   Accu-Chek Guide test strip Generic drug: glucose blood Use as directed 4 times daily.   acetaminophen 325 MG tablet Commonly known as: TYLENOL Take 2 tablets (650 mg total) by mouth every 6 (six) hours as needed for fever.   allopurinol 100 MG tablet Commonly known as: ZYLOPRIM Take 1  tablet (100 mg total) by mouth daily.   amiodarone 200 MG tablet Commonly known as: PACERONE Take 1 tablet (200 mg total) by mouth daily.   amLODipine 10 MG tablet Commonly known as: NORVASC Take 1 tablet (10 mg total) by mouth daily   ceFAZolin  IVPB Commonly known as: ANCEF Inject 2 g into the vein every 8 (eight) hours. Indication:  LVAD deep driveline infection First Dose: Yes Last Day of Therapy:  09/28/2022 Labs - Once weekly:  CBC/D and BMP, Labs - Every other week:  ESR and CRP Method of administration: IV Push Method of administration may be changed at the discretion of home infusion pharmacist based upon assessment of the patient and/or caregiver's ability to self-administer  the medication ordered. Please pull PICC at the end of therapy   colchicine 0.6 MG tablet Take 1 tablet (0.6 mg total) by mouth once daily.   diclofenac Sodium 1 % Gel Commonly known as: VOLTAREN Apply 2 g topically 4 (four) times daily. What changed:  when to take this reasons to take this   docusate sodium 100 MG capsule Commonly known as: COLACE Take 1 capsule (100 mg total) by mouth 2 (two) times daily.   doxazosin 1 MG tablet Commonly known as: CARDURA Take 2 tablets (2 mg total) by mouth daily. What changed: how much to take   Entresto 97-103 MG Generic drug: sacubitril-valsartan Take 1 tablet by mouth 2 (two) times daily.   gabapentin 400 MG capsule Commonly known as: NEURONTIN Take 1 capsule (400 mg total) by mouth 3 (three) times daily. What changed:  medication strength how much to take   HYDROcodone-acetaminophen 10-325 MG tablet Commonly known as: NORCO Take 1 tablet by mouth every 6 (six) hours as needed for severe pain.   Insulin Syringe-Needle U-100 30G X 1/2" 1 ML Misc Commonly known as: UltiCare Insulin Syringe USE TO INJECT INSULIN TWICE DAILY. Replaces: Insulin Syringe-Needle U-100 31G X 5/16" 0.5 ML Misc   isosorbide-hydrALAZINE 20-37.5 MG tablet Commonly  known as: BIDIL Take 2 tablets by mouth 3 (three) times daily.   meclizine 25 MG tablet Commonly known as: ANTIVERT Take 1 tablet (25 mg total) by mouth 3 (three) times daily as needed for dizziness.   metFORMIN 1000 MG tablet Commonly known as: GLUCOPHAGE Take 1 tablet (1,000 mg total) by mouth 2 (two) times daily with a meal.   Ozempic (2 MG/DOSE) 8 MG/3ML Sopn Generic drug: Semaglutide (2 MG/DOSE) Inject 2 mg into the skin once a week.   pantoprazole 40 MG tablet Commonly known as: PROTONIX Take 1 tablet (40 mg total) by mouth once daily.   sertraline 100 MG tablet Commonly known as: ZOLOFT Take 1.5 tablets (150 mg total) by mouth daily.   spironolactone 25 MG tablet Commonly known as: ALDACTONE Take 1 tablet (25 mg total) by mouth daily.   torsemide 20 MG tablet Commonly known as: DEMADEX Take 1 tablet (20 mg total) by mouth daily as needed (If wt gain > 3 lb in 24 hr or > 5 lb in 1 week).   traZODone 100 MG tablet Commonly known as: DESYREL Take 1 tablet (100 mg total) by mouth at bedtime.   TRUEplus 5-Bevel Pen Needles 29G X 12.7MM Misc Generic drug: Insulin Pen Needle Use as directed in the morning, at noon, and at bedtime.   warfarin 4 MG tablet Commonly known as: COUMADIN Take as directed. If you are unsure how to take this medication, talk to your nurse or doctor. Original instructions: Take 2 tablet (8 mg) daily or as directed by HF clinic What changed: additional instructions   Zinc Sulfate 220 (50 Zn) MG Tabs Take 1 tablet (220 mg total) by mouth daily. Start taking on: August 27, 2022               Durable Medical Equipment  (From admission, onward)           Start     Ordered   08/19/22 1436  Heart failure home health orders  (Heart failure home health orders / Face to face)  Once       Comments: Heart Failure Follow-up Care:  Verify follow-up appointments per Patient Discharge Instructions. Confirm transportation  arranged. Reconcile home medications with  discharge medication list. Remove discontinued medications from use. Assist patient/caregiver to manage medications using pill box. Reinforce low sodium food selection Assessments: Vital signs and oxygen saturation at each visit. Assess home environment for safety concerns, caregiver support and availability of low-sodium foods. Consult Child psychotherapist, PT/OT, Dietitian, and CNA based on assessments. Perform comprehensive cardiopulmonary assessment. Notify MD for any change in condition or weight gain of 3 pounds in one day or 5 pounds in one week with symptoms. Daily Weights and Symptom Monitoring: Ensure patient has access to scales. Teach patient/caregiver to weigh daily before breakfast and after voiding using same scale and record.    Teach patient/caregiver to track weight and symptoms and when to notify Provider. Activity: Develop individualized activity plan with patient/caregiver.   Question Answer Comment  Heart Failure Follow-up Care [facility] ([facility]) [facility] at [phone]   Obtain the following labs Basic Metabolic Panel   Lab frequency Weekly   Fax lab results to AHF Clinic at [phone]   Diet Low Sodium Heart Healthy   Fluid restrictions: 1800 mL Fluid      08/19/22 1437              Discharge Care Instructions  (From admission, onward)           Start     Ordered   08/26/22 0000  Discharge wound care:       Comments: Clinic performed, per follow-up appts with Dr. Maren Beach   08/26/22 0955   08/20/22 0000  Change dressing on IV access line weekly and PRN  (Home infusion instructions - [facility] )        08/20/22 1152            Disposition   The patient will be discharged in stable condition to home. Discharge Instructions     (HEART FAILURE PATIENTS) Call MD:  Anytime you have any of the following symptoms: 1) 3 pound weight gain in 24 hours or 5 pounds in 1 week 2) shortness of  breath, with or without a dry hacking cough 3) swelling in the hands, feet or stomach 4) if you have to sleep on extra pillows at night in order to breathe.   Complete by: As directed    [facility] pharmacist to adjust dose for Vancomycin, Aminoglycosides and other anti-infective therapies as requested by physician.   Complete by: As directed    [facility] to provide Cath Flo 2mg    Complete by: As directed    Administer for PICC line occlusion and as ordered by physician for other access device issues.   Anaphylaxis Kit: Provided to treat any anaphylactic reaction to the medication being provided to the patient if First Dose or when requested by physician   Complete by: As directed    Epinephrine 1mg /ml vial / amp: Administer 0.3mg  (0.52ml) subcutaneously once for moderate to severe anaphylaxis, nurse to call physician and pharmacy when reaction occurs and call 911 if needed for immediate care   Diphenhydramine 50mg /ml IV vial: Administer 25-50mg  IV/IM PRN for first dose reaction, rash, itching, mild reaction, nurse to call physician and pharmacy when reaction occurs   Sodium Chloride 0.9% NS IV: Administer if needed for hypovolemic blood pressure drop or as ordered by physician after call to physician with anaphylactic reaction   Change dressing on IV access line weekly and PRN   Complete by: As directed    Diet - low sodium heart healthy   Complete by: As  directed    Discharge wound care:   Complete by: As directed    Clinic performed, per follow-up appts with Dr. Maren Beach   Flush IV access with Sodium Chloride 0.9% and Heparin 10 units/ml or 100 units/ml   Complete by: As directed    Home infusion instructions - [facility]   Complete by: As directed    Instructions: Flush IV access with Sodium Chloride 0.9% and Heparin 10units/ml or 100units/ml   Change dressing on IV access line: Weekly and PRN   Instructions Cath Flo 2mg : Administer for PICC Line  occlusion and as ordered by physician for other access device   [facility] pharmacist to adjust dose for: Vancomycin, Aminoglycosides and other anti-infective therapies as requested by physician   Increase activity slowly   Complete by: As directed    Method of administration may be changed at the discretion of home infusion pharmacist based upon assessment of the patient and/or caregiver's ability to self-administer the medication ordered   Complete by: As directed        Follow-up Information     [facility] Follow up.   Why: Registered Nurse- office to call with visit times. Contact information: [address], [place], [place] [address]                  Duration of Discharge Encounter: Greater than 35 minutes   Signed, Alen Bleacher AGACNP-BC  08/26/2022, 12:03 PM  Agree with above. He is ready for d/c. HHRN arranged for help with wound vac and IV abx. Will f/u in VAD clinic.   Please see daily rounding note for furthrer details.   Arvilla Meres, MD  1:24 PM

## 2022-08-19 NOTE — Progress Notes (Addendum)
Advanced Heart Failure VAD Team Note  PCP-Cardiologist: Glori Bickers, MD   Subjective:    11/6: Admitted w/ DL infection. CT with diffuse soft-tissue involvement but no overt abscess. Wound Cx growing few staph aureus. BCx NGTD.  11/8: s/p extensive debridement + wound vac. Wound Cx from OR NGTD 11/14: went to OR for debridement + wound vac exchange  Had bleeding around wound vac this morning, wound vac changed at bedside, unable to cauterize vessels so stitches were used to stop bleeding per patient report. Pt with pain this morning post bedside procedure. Denies CP and SOB.  LVAD INTERROGATION:  HeartMate III LVAD:   Flow 5.4 liters/min, speed 6200, power 5.3 , PI 2.1. VAD interrogated personally. Parameters stable. x2 PI events  Objective:    Vital Signs:   Temp:  [98.1 F (36.7 C)-99.1 F (37.3 C)] 98.1 F (36.7 C) (11/15 0752) Pulse Rate:  [74-88] 79 (11/15 0752) Resp:  [12-20] 20 (11/15 0752) BP: (102-135)/(77-112) 113/89 (11/15 0752) SpO2:  [91 %-95 %] 91 % (11/15 0752) Weight:  [129.6 kg] 129.6 kg (11/15 0537) Last BM Date : 08/18/22 Mean arterial Pressure 80-120s?(Suspect 2/2 pain after morning events, wil recheck)  Intake/Output:   Intake/Output Summary (Last 24 hours) at 08/19/2022 1009 Last data filed at 08/19/2022 0628 Gross per 24 hour  Intake 1516.25 ml  Output 685 ml  Net 831.25 ml     Physical Exam   General:  Well appearing. No resp difficulty HEENT: Normal Neck: supple. JVP ~7 . Carotids 2+ bilat; no bruits. No lymphadenopathy or thyromegaly appreciated. Cor: Mechanical heart sounds with LVAD hum present. Lungs: Clear Abdomen: soft, nontender, nondistended. No hepatosplenomegaly. No bruits or masses. Good bowel sounds. Driveline: C/D/I; securement device intact. Wound vac in place with good suction Extremities: no cyanosis, clubbing, rash, edema Neuro: alert & orientedx3, cranial nerves grossly intact. moves all 4 extremities w/o  difficulty. Affect pleasant   Telemetry   NSR 80s. NSVT 4 beats (Personally reviewed)    Labs   Basic Metabolic Panel: Recent Labs  Lab 08/14/22 0427 08/15/22 0016 08/16/22 0550 08/17/22 0450 08/18/22 0340  NA 137 137 139 137 138  K 4.9 4.5 4.4 4.3 4.2  CL 109 108 106 104 104  CO2 '25 24 26 26 27  '$ GLUCOSE 92 102* 102* 99 107*  BUN '20 19 17 19 '$ 24*  CREATININE 1.19 1.32* 1.26* 1.34* 1.42*  CALCIUM 9.1 9.6 10.0 10.0 10.4*    Liver Function Tests: No results for input(s): "AST", "ALT", "ALKPHOS", "BILITOT", "PROT", "ALBUMIN" in the last 168 hours.  No results for input(s): "LIPASE", "AMYLASE" in the last 168 hours. No results for input(s): "AMMONIA" in the last 168 hours.  CBC: Recent Labs  Lab 08/15/22 0016 08/16/22 0550 08/17/22 0450 08/18/22 0340 08/19/22 0540  WBC 7.0 7.3 7.2 7.6 10.7*  HGB 12.3* 12.6* 13.0 13.1 13.1  HCT 37.3* 37.4* 38.7* 38.9* 39.3  MCV 90.1 90.1 90.2 89.4 89.9  PLT 248 279 312 300 322    INR: Recent Labs  Lab 08/15/22 0016 08/16/22 0550 08/17/22 0450 08/18/22 0340 08/19/22 0540  INR 1.1 1.0 1.0 1.1 1.0    Other results:   Imaging   No results found.   Medications:     Scheduled Medications:  allopurinol  100 mg Oral Daily   amiodarone  200 mg Oral Daily   amLODipine  10 mg Oral Daily   Chlorhexidine Gluconate Cloth  6 each Topical Daily   colchicine  0.6 mg Oral Daily  doxazosin  1 mg Oral Daily   gabapentin  300 mg Oral TID   insulin aspart  0-9 Units Subcutaneous TID WC   isosorbide-hydrALAZINE  2 tablet Oral TID   metFORMIN  1,000 mg Oral BID WC   multivitamin with minerals  1 tablet Oral Daily   nutrition supplement (JUVEN)  1 packet Oral BID BM   pantoprazole  40 mg Oral Daily   sacubitril-valsartan  1 tablet Oral BID   sertraline  150 mg Oral Daily   sodium chloride flush  10-40 mL Intracatheter Q12H   spironolactone  25 mg Oral Daily   traZODone  100 mg Oral QHS   vancomycin (VANCOCIN) 1,000 mg in  sodium chloride 0.9 % 1,000 mL irrigation   Irrigation To OR   Warfarin - Pharmacist Dosing Inpatient   Does not apply q1600   zinc sulfate  220 mg Oral Daily    Infusions:  sodium chloride 10 mL/hr at 08/19/22 9357    ceFAZolin (ANCEF) IV 200 mL/hr at 08/19/22 0177   heparin 500 Units/hr (08/19/22 0628)    PRN Medications: sodium chloride, acetaminophen, hydrALAZINE, HYDROcodone-acetaminophen, morphine injection, naloxone, mouth rinse, oxyCODONE, sodium chloride flush, traMADol   Patient Profile   46 y.o. male with a history of  poorly controlled HTN, R renal cell carcinoma s/p nephrectomy, CKD IIIa, DM2, OSA, gout, morbid obesity and systolic HF due to NICM. 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    Assessment/Plan:    1. DL infection/ Abdominal Wall Cellulitis  - prior DL infection 3/10, wound cx + rare staph aureus, resolved w/ outpatient abx  - now w/ recurrent infection  - wound Cx + staph aureus, blood cx NGTD - CT with diffuse soft-tissue involvement but no overt abscess - ID following, abx narrowed to cefazolin  - s/p debridement + wound vac 11/8, and 11/14. Bedside wound vac change and vessel cauterization this morning 2/2 hematoma.  - continue IV abx  2.  Chronic systolic CHF  - Nonischemic CM, end-stage  - EF 2012; 20-25%  - Echo  8/22: EF < 20%, severe LV dilation, moderate RV dysfunction.   - RHC (8/22): well compensated hemodynamics.  - s/p S-ICD  - S/p HM3 LVAD 10/17 (DT)  - Continue Bidil 2 tabs tid  - Continue spironolactone 25 mg daily  - Continue entresto 97/103 bid  - Resume ozempic after discharge.  - Volume status stable. Holding diuretics. Ok to resume home PRN torsemide 20 gm daily at d/c  3. VAD  - s/p HM-3 placement 07/21/21  - VAD interrogated personally. Parameters stable.] - Holding Coumadin for debridement of DL. - LDH stable - Hep gtt on hold until noon 2/2 bleeding around surgical site  this morning - Continue heparin at noon. Warfarin restarted last night per Dr. Darcey Nora   4 Atrial fibrillation: Paroxysmal  - Had severe AF with RVR 7/22 s/p DC-CV x 2  - Seen by EP. Not a candidate for ablation due to body habitus  (could consider AVN ablation and CRT, if needed)  - Had post-op AF with RVR post VAD  - 10/23 S/P DC-CV with restoration of NSR.  - Remains in NSR. Continue amio 200 daily  - Coumadin restarted last night. Continue heparin gtt    5. HTN  -  MAPs ok today - If MAP > 100 can give IV hydralazine prn   6. CKD stage 3B  - Has solitary kidney.   - Baseline  creatinine is 1.5-1.8.  - sCr pending today - holding diuretic  7. OSA  - Continue CPAP    8. DM2  - On Jardiance.  - Hgb A1c 9.4. -> 5.9  - PCP following    9. Polyarticular gout  - quiescent    10. Depression/anxiety  - Continue Zoloft '150mg'$  daily    11. Morbid obesity  -Body mass index is 44.75 kg/m. - Hold GLP1RA for OR (last dose 11/2)  I reviewed the LVAD parameters from today, and compared the results to the patient's prior recorded data.  No programming changes were made.  The LVAD is functioning within specified parameters.  The patient performs LVAD self-test daily.  LVAD interrogation was negative for any significant power changes, alarms or PI events/speed drops.  LVAD equipment check completed and is in good working order.  Back-up equipment present.   LVAD education done on emergency procedures and precautions and reviewed exit site care.  Length of Stay: [place], AGACNP-BC  10:09 AM  VAD Team --- VAD ISSUES ONLY--- Pager [phone] (7am - 7am)  Advanced Heart Failure Team  Pager [phone] (M-F; 7a - 5p)  Please contact [facility] for night-coverage after hours (5p -7a ) and weekends on [email or web address]  Patient seen and examined with the above-signed Advanced Practice Provider and/or Housestaff. I personally reviewed laboratory data, imaging studies and relevant notes. I  independently examined the patient and formulated the important aspects of the plan. I have edited the note to reflect any of my changes or salient points. I have personally discussed the plan with the patient and/or family.  Was bleeding at vac site overnight. Wound vac changed and vessels stitched. Bleeding has resolved. Still with some site pain. Remains on IV abx. No f/c. Hold heparin for several hours.   VAD interrogated personally. Parameters stable.  General:  NAD.  HEENT: normal  Neck: supple. JVP not elevated.  Carotids 2+ bilat; no bruits. No lymphadenopathy or thryomegaly appreciated. Cor: LVAD hum.  Lungs: Clear. Abdomen: obese soft, mildly tender, non-distended. No hepatosplenomegaly. No bruits or masses. Good bowel sounds. Wound vac site ok. Anchor in place.  Extremities: no cyanosis, clubbing, rash. Warm no edema  Neuro: alert & oriented x 3. No focal deficits. Moves all 4 without problem   Wound vac changed. Bleeding no resolved. Hold heparin for several hours. Resume at noon. Continue IV abx. VAD interrogated personally. Parameters stable.  Glori Bickers, MD  10:50 AM

## 2022-08-19 NOTE — Progress Notes (Signed)
Mobility Specialist Progress Note    08/19/22 1446  Mobility  Activity Ambulated independently in hallway  Level of Assistance Independent after set-up  Assistive Device None  Distance Ambulated (ft) 420 ft  Activity Response Tolerated well  Mobility Referral Yes  $Mobility charge 1 Mobility   Post-Mobility: 89 HR  Pt received coming out of BR and agreeable. No complaints on walk. Returned to sitting EOB with call Pinder in reach.    [name] Mobility Specialist  Please Psychologist, sport and exercise or Rehab Office at [phone]

## 2022-08-19 NOTE — Progress Notes (Signed)
1 Day Post-Op Procedure(s) (LRB): ABDOMINAL WOUND DEBRIDEMENT (N/A) WOUND VAC CHANGE (N/A) Subjective: Patient had stable night after VAD tunnel debridement and wound VAC placement yesterday afternoon. Heparin at 500 units/h. This a.m. wound VAC suction alarms were noted and a moderate hematoma external to the wound VAC sponge was observed.  Under sterile technique and after IV pain medicine was given the wound VAC was changed out including a new suction sponge.  There is a small bleeder in the subcutaneous fat at the proximal extent of the incision which was cauterized and then a transfixion suture was placed for hemostasis.  Remainder of the wound was cleaned and healthy looking tissue.  A new wound VAC sponge and sterile sheath were placed and suction applied.  Objective: Vital signs in last 24 hours: Temp:  [98.1 F (36.7 C)-99.1 F (37.3 C)] 98.1 F (36.7 C) (11/15 0752) Pulse Rate:  [74-88] 79 (11/15 0752) Cardiac Rhythm: Normal sinus rhythm (11/14 2025) Resp:  [12-20] 20 (11/15 0752) BP: (102-135)/(77-112) 113/89 (11/15 0752) SpO2:  [91 %-95 %] 91 % (11/15 0752) Weight:  [129.6 kg] 129.6 kg (11/15 0537)  Hemodynamic parameters for last 24 hours:  Stable  Intake/Output from previous day: 11/14 0701 - 11/15 0700 In: 1535.7 [I.V.:788.6; IV Piggyback:747] Out: 685 [Urine:525; Drains:150; Blood:10] Intake/Output this shift: No intake/output data recorded.       Exam    General- alert and comfortable    Neck- no JVD, no cervical adenopathy palpable, no carotid bruit   Lungs- clear without rales, wheezes   Cor- regular rate and rhythm, no murmur , gallop   Abdomen- soft, non-tender.  Wound VAC sponge changed after hematoma formed with interruption of suction.   Extremities - warm, non-tender, minimal edema   Neuro- oriented, appropriate, no focal weakness   Lab Results: Recent Labs    08/18/22 0340 08/19/22 0540  WBC 7.6 10.7*  HGB 13.1 13.1  HCT 38.9* 39.3  PLT  300 322   BMET:  Recent Labs    08/17/22 0450 08/18/22 0340  NA 137 138  K 4.3 4.2  CL 104 104  CO2 26 27  GLUCOSE 99 107*  BUN 19 24*  CREATININE 1.34* 1.42*  CALCIUM 10.0 10.4*    PT/INR:  Recent Labs    08/19/22 0540  LABPROT 13.4  INR 1.0   ABG    Component Value Date/Time   PHART 7.368 07/25/2021 0436   HCO3 28.9 (H) 07/25/2021 0436   TCO2 30 07/25/2021 0436   ACIDBASEDEF 1.0 07/24/2021 0449   O2SAT 73.7 08/05/2021 0515   CBG (last 3)  Recent Labs    08/18/22 1650 08/18/22 2137 08/19/22 0536  GLUCAP 105* 169* 125*    Assessment/Plan: S/P Procedure(s) (LRB): ABDOMINAL WOUND DEBRIDEMENT (N/A) WOUND VAC CHANGE (N/A) Continue wound VAC suction at -100 mmHg. Resume low-dose 500 units/h IV heparin at noon Continue loading oral Coumadin to transition off heparin. Still plan on discharge with IV antibiotics through PICC line (MSSA) and wound VAC therapy which will be monitored at [place] clinic.  All   LOS: 9 days    Dahlia Byes 08/19/2022

## 2022-08-19 NOTE — Progress Notes (Signed)
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 Cyndia Bent under Destination Therapy criteria due to BMI and chronic kidney disease.    Received page from bedside RN this morning reporting bleeding from wound vac, and bleeding under vac dressing. Wound vac changed at bedside by Dr Prescott Gum. See documentation below. Pt premedicated prior to dressing change.   Currently on Cefazolin 2g Q 8 hrs per ID.   Pt will need wound vac and home IV antibiotics at hospital discharge. Home wound vac at bedside. Home health arranged with [facility] and [facility].   Vital signs: Temp:  98.1 HR: 78 NSR Doppler: 120 Auto cuff: 113/89 (97) O2 Sat: 95% on RA Wt: 298>300.9>300.4>300.9>290.4>286.4>285.7 lbs   LVAD interrogation reveals:  Speed: 6200 Flow: 5.0 Power:  5.2 w PI: 3.4 Hct: 41   Alarms:  none Events: 2 PI event so far today   Fixed speed:  6200 Low speed limit: 5900   Drive Line: Existing VAC dressing with bloody drainage pooled under dressing. VAC dressing and sponge removed by Dr Prescott Gum using sterile technique. Site cleansed with CHG swab x 2 and allowed to dry. Area of bleeding in upper wound bed cauterized with bedside cautery. Area continued to bleed. Dr Prescott Gum placed 2 0-prolene sutures to area and hemostasis was achieved. Blue wound vac sponge placed in wound bed. Skin around wound prepped with benzoin. Covered sponge with clear vac dressing. Attached to new suction canister. Wound suction set at -100 per Dr Prescott Gum. Good seal achieved. No leak or blockage alarms noted. Anchor correctly applied. Plan for wound vac change next week at bedside or in VAD clinic if discharged later this week.   Labs:  LDH trend: 130>147>128>126>149>147>186   INR trend: 1.4>1.2>1.1>1.0>1.1>1.0  WBC: 11.5>10.9>9.1>7.1>7.2>7.6>10.7  Anticoagulation Plan: -INR  Goal: 2.0 - 2.5  -ASA Dose: none due to allergy     Blood Products:     Device: - [employer] single ICD -Therapies: on per BS rep on 08/06/21     Infection:  08/10/22>>driveline cx -- FEW STAPHYLOCOCCUS AUREUS  08/10/22>>BC x 2 -- NO GROWTH x 5 DAYS; final 08/12/22>> driveline cx OR -- NGTD; final 08/18/22>> driveline cx OR)-- no growth < 24 hours  Drips:  Heparin 5 units/hr  Plan/Recommendations:  Call VAD Coordinator if any VAD equipment or drive line issues. Call VAD coordinator if any wound vac issues.   [name] RN [place] Coordinator  Office: [phone]  24/7 Pager: [phone]

## 2022-08-19 NOTE — Anesthesia Postprocedure Evaluation (Signed)
Anesthesia Post Note  Patient: [name]  Procedure(s) Performed: ABDOMINAL WOUND DEBRIDEMENT WOUND VAC CHANGE     Patient location during evaluation: PACU Anesthesia Type: General Level of consciousness: awake and alert Pain management: pain level controlled Vital Signs Assessment: post-procedure vital signs reviewed and stable Respiratory status: spontaneous breathing, nonlabored ventilation, respiratory function stable and patient connected to nasal cannula oxygen Cardiovascular status: blood pressure returned to baseline and stable Postop Assessment: no apparent nausea or vomiting Anesthetic complications: no   No notable events documented.  Last Vitals:  Vitals:   08/19/22 0752 08/19/22 1107  BP: 113/89 98/85  Pulse: 79 72  Resp: 20   Temp: 36.7 C 36.5 C  SpO2: 91% 93%    Last Pain:  Vitals:   08/19/22 1200  TempSrc:   PainSc: 9                  Tiajuana Amass

## 2022-08-19 NOTE — Plan of Care (Signed)
  Problem: Education: Goal: Patient will understand all VAD equipment and how it functions Outcome: Progressing Goal: Patient will be able to verbalize current INR target range and antiplatelet therapy for discharge home Outcome: Progressing   Problem: Cardiac: Goal: LVAD will function as expected and patient will experience no clinical alarms Outcome: Progressing   Problem: Education: Goal: Knowledge of General Education information will improve Description: Including pain rating scale, medication(s)/side effects and non-pharmacologic comfort measures Outcome: Progressing   Problem: Health Behavior/Discharge Planning: Goal: Ability to manage health-related needs will improve Outcome: Progressing   Problem: Education: Goal: Knowledge of General Education information will improve Description: Including pain rating scale, medication(s)/side effects and non-pharmacologic comfort measures Outcome: Progressing   Problem: Health Behavior/Discharge Planning: Goal: Ability to manage health-related needs will improve Outcome: Progressing   Problem: Clinical Measurements: Goal: Ability to maintain clinical measurements within normal limits will improve Outcome: Progressing Goal: Will remain free from infection Outcome: Progressing Goal: Diagnostic test results will improve Outcome: Progressing Goal: Respiratory complications will improve Outcome: Progressing Goal: Cardiovascular complication will be avoided Outcome: Progressing   Problem: Activity: Goal: Risk for activity intolerance will decrease Outcome: Progressing   Problem: Nutrition: Goal: Adequate nutrition will be maintained Outcome: Progressing   Problem: Coping: Goal: Level of anxiety will decrease Outcome: Progressing   Problem: Elimination: Goal: Will not experience complications related to bowel motility Outcome: Progressing Goal: Will not experience complications related to urinary retention Outcome:  Progressing   Problem: Pain Managment: Goal: General experience of comfort will improve Outcome: Progressing   Problem: Safety: Goal: Ability to remain free from injury will improve Outcome: Progressing   Problem: Skin Integrity: Goal: Risk for impaired skin integrity will decrease Outcome: Progressing   Problem: Education: Goal: Ability to describe self-care measures that may prevent or decrease complications (Diabetes Survival Skills Education) will improve Outcome: Progressing Goal: Individualized Educational Video(s) Outcome: Progressing   Problem: Coping: Goal: Ability to adjust to condition or change in health will improve Outcome: Progressing   Problem: Fluid Volume: Goal: Ability to maintain a balanced intake and output will improve Outcome: Progressing   Problem: Health Behavior/Discharge Planning: Goal: Ability to identify and utilize available resources and services will improve Outcome: Progressing Goal: Ability to manage health-related needs will improve Outcome: Progressing   Problem: Metabolic: Goal: Ability to maintain appropriate glucose levels will improve Outcome: Progressing   Problem: Nutritional: Goal: Maintenance of adequate nutrition will improve Outcome: Progressing Goal: Progress toward achieving an optimal weight will improve Outcome: Progressing   Problem: Skin Integrity: Goal: Risk for impaired skin integrity will decrease Outcome: Progressing   Problem: Tissue Perfusion: Goal: Adequacy of tissue perfusion will improve Outcome: Progressing

## 2022-08-19 NOTE — Progress Notes (Signed)
[place] for Infectious Disease  Date of Admission:  08/10/2022   Total days of inpatient antibiotics 9  Principal Problem:   Infection associated with driveline of left ventricular assist device (LVAD) ([place])          Assessment: 60 YM admitted with:     #LVAD exit site infection #ICD #Hx of MSSA from LVAD exit site #NICM status post HM 3 VAD  placement on 07/21/2021 #Fever and leukocytosis -resolved -Pt had doxy followed by CTX->30 days of Augmentin for LVAD exit site infection with wound Cx on 12/22/21 + MSSA. He reports he think he had a wound at the site initially after bumping the device. -He noted that Sunday he started having abdominal  pain then purulence from exit site. Following arrival he had temp of 102.3(wbc 11.9k) and admitted for DL infection/abdominal wall cellulitis. -CT showed fluid attenuation around DL in subq fat c/w drive line infection, edema in abdominal wall. Bedside wound Cx+ staph aureus -Taken to OR on 11/8 for debridement with Dr. Prescott Gum. Noted to have tunneled infection with excision of infected tissue including fascia. Pocket of fluid drained. OR Cx NG.  -Repeat Debridement on 11/14 with debridement of tunnel, rectus sheath, no purulence pocket of fluid noted. PR Cx sent  Recommendations: -Follow OR Cx(bacterial/AFB/fungal Cx) form 11/14 - Continue cefazolin, 6 weeks from OR(11/14) , EOT 12/25 then transition to cefadroxil 549m PO bid for chronic suppression.  - PICC in place  ID will sign off, please engage with any question or concerns.    OPAT ORDERS:  Diagnosis: MSSA driveline infection  Culture Result: bedside + MSSA, OR Cx NG  Allergies  Allergen Reactions   Aspirin Shortness Of Breath, Itching and Rash     Burning sensation (Patient reports he tolerates other NSAIDS)    Bee Venom Hives and Swelling   Lisinopril Cough   Shrimp (Diagnostic) Rash     Discharge antibiotics to be given via PICC line:  Per pharmacy  protocol cefazolin    Duration: 6 weeks [facility] Use: Home health RN for IV administration and teaching, line care and labs.    Labs weekly while on IV antibiotics: __ CBC with differential  __ CMP __ CRP __ ESR  __ Please leave PIC in place until doctor has seen patient or been notified  Fax weekly labs to ([phone] Clinic Follow Up Appt: 11/27  @ [facility] with Dr. SCandiss Norse  Microbiology:   Antibiotics: Vancomycin and cefepime 11/6 Vanc 11/6-11/8 Cefazolin 11/7-p Cultures: 11/6 blood Cx 11/6 wound Cx staph aureus 11/8 NG     SUBJECTIVE: Resting in bed.  Interval: Afebrile overnight, wb c10.7k  Review of Systems: Review of Systems  All other systems reviewed and are negative.    Scheduled Meds:  allopurinol  100 mg Oral Daily   amiodarone  200 mg Oral Daily   amLODipine  10 mg Oral Daily   Chlorhexidine Gluconate Cloth  6 each Topical Daily   colchicine  0.6 mg Oral Daily   doxazosin  1 mg Oral Daily   gabapentin  300 mg Oral TID   insulin aspart  0-9 Units Subcutaneous TID WC   isosorbide-hydrALAZINE  2 tablet Oral TID   metFORMIN  1,000 mg Oral BID WC   multivitamin with minerals  1 tablet Oral Daily   nutrition supplement (JUVEN)  1 packet Oral BID BM   pantoprazole  40 mg Oral Daily   sacubitril-valsartan  1 tablet Oral BID   sertraline  150 mg Oral Daily   sodium chloride flush  10-40 mL Intracatheter Q12H   spironolactone  25 mg Oral Daily   traZODone  100 mg Oral QHS   vancomycin (VANCOCIN) 1,000 mg in sodium chloride 0.9 % 1,000 mL irrigation   Irrigation To OR   warfarin  8 mg Oral ONCE-1600   Warfarin - Pharmacist Dosing Inpatient   Does not apply q1600   zinc sulfate  220 mg Oral Daily   Continuous Infusions:  sodium chloride 10 mL/hr at 08/19/22 0628    ceFAZolin (ANCEF) IV 200 mL/hr at 08/19/22 6433   heparin 500 Units/hr (08/19/22 1202)   PRN Meds:.sodium chloride, acetaminophen,  hydrALAZINE, HYDROcodone-acetaminophen, morphine injection, naloxone, mouth rinse, oxyCODONE, sodium chloride flush, traMADol Allergies  Allergen Reactions   Aspirin Shortness Of Breath, Itching and Rash     Burning sensation (Patient reports he tolerates other NSAIDS)    Bee Venom Hives and Swelling   Lisinopril Cough   Shrimp (Diagnostic) Rash    OBJECTIVE: Vitals:   08/19/22 0020 08/19/22 0537 08/19/22 0752 08/19/22 1107  BP: 102/77 102/83 113/89 98/85  Pulse: 88 78 79 72  Resp: _0 Temp: 98.3 F (36.8 C) 98.3 F (36.8 C) 98.1 F (36.7 C) 97.7 F (36.5 C)  TempSrc: Oral Oral Oral Oral  SpO2: 95% 91% 91% 93%  Weight:  129.6 kg    Height:       Body mass index is 44.75 kg/m.  Physical Exam Constitutional:      General: He is not in acute distress.    Appearance: He is normal weight. He is not toxic-appearing.  HENT:     Head: Normocephalic and atraumatic.     Right Ear: External ear normal.     Left Ear: External ear normal.     Nose: No congestion or rhinorrhea.     Mouth/Throat:     Mouth: Mucous membranes are moist.     Pharynx: Oropharynx is clear.  Eyes:     Extraocular Movements: Extraocular movements intact.     Conjunctiva/sclera: Conjunctivae normal.     Pupils: Pupils are equal, round, and reactive to light.  Cardiovascular:     Rate and Rhythm: Normal rate and regular rhythm.     Heart sounds: No murmur heard.    No friction rub. No gallop.  Pulmonary:     Effort: Pulmonary effort is normal.     Breath sounds: Normal breath sounds.  Abdominal:     General: Abdomen is flat. Bowel sounds are normal.     Palpations: Abdomen is soft.     Comments: Wound vac in place  Musculoskeletal:        General: No swelling. Normal range of motion.     Cervical back: Normal range of motion and neck supple.  Skin:    General: Skin is warm and dry.  Neurological:     General: No focal deficit present.     Mental Status: He is oriented to person, place,  and time.  Psychiatric:        Mood and Affect: Mood normal.       Lab Results Lab Results  Component Value Date   WBC 10.7 (H) 08/19/2022   HGB 13.1 08/19/2022   HCT 39.3 08/19/2022   MCV 89.9 08/19/2022   PLT 322 08/19/2022    Lab Results  Component Value Date  CREATININE 1.42 (H) 08/18/2022   BUN 24 (H) 08/18/2022   NA 138 08/18/2022   K 4.2 08/18/2022   CL 104 08/18/2022   CO2 27 08/18/2022    Lab Results  Component Value Date   ALT 16 08/10/2022   AST 19 08/10/2022   ALKPHOS 41 08/10/2022   BILITOT 1.1 08/10/2022        Laurice Record, [place] for Infectious Disease [facility] 08/19/2022, 12:41 PM

## 2022-08-19 NOTE — Progress Notes (Signed)
Patient states he does not tolerate our CPAP mask.  Patient wears nasal pillows at home.  RT suggested patient have a family or friend bring in home CPAP.  RT available if patient changes their mind.

## 2022-08-20 DIAGNOSIS — T827XXA Infection and inflammatory reaction due to other cardiac and vascular devices, implants and grafts, initial encounter: Secondary | ICD-10-CM | POA: Diagnosis not present

## 2022-08-20 LAB — HEPARIN LEVEL (UNFRACTIONATED): Heparin Unfractionated: <0.1 IU/mL — ABNORMAL LOW (ref 0.30–0.70)

## 2022-08-20 LAB — CBC
HCT: 37.9 % — ABNORMAL LOW (ref 39.0–52.0)
Hemoglobin: 12.7 g/dL — ABNORMAL LOW (ref 13.0–17.0)
MCH: 30.5 pg (ref 26.0–34.0)
MCHC: 33.5 g/dL (ref 30.0–36.0)
MCV: 91.1 fL (ref 80.0–100.0)
Platelets: 295 10*3/uL (ref 150–400)
RBC: 4.16 MIL/uL — ABNORMAL LOW (ref 4.22–5.81)
RDW: 14.3 % (ref 11.5–15.5)
WBC: 10.3 10*3/uL (ref 4.0–10.5)
nRBC: 0 % (ref 0.0–0.2)

## 2022-08-20 LAB — GLUCOSE, CAPILLARY
Glucose-Capillary: 110 mg/dL — ABNORMAL HIGH (ref 70–99)
Glucose-Capillary: 118 mg/dL — ABNORMAL HIGH (ref 70–99)
Glucose-Capillary: 131 mg/dL — ABNORMAL HIGH (ref 70–99)
Glucose-Capillary: 118 mg/dL — ABNORMAL HIGH (ref 70–99)

## 2022-08-20 LAB — BASIC METABOLIC PANEL
Anion gap: 7 (ref 5–15)
BUN: 35 mg/dL — ABNORMAL HIGH (ref 6–20)
CO2: 25 mmol/L (ref 22–32)
Calcium: 9.6 mg/dL (ref 8.9–10.3)
Chloride: 102 mmol/L (ref 98–111)
Creatinine, Ser: 1.53 mg/dL — ABNORMAL HIGH (ref 0.61–1.24)
GFR, Estimated: 56 mL/min — ABNORMAL LOW (ref >60)
Glucose, Bld: 101 mg/dL — ABNORMAL HIGH (ref 70–99)
Potassium: 4.4 mmol/L (ref 3.5–5.1)
Sodium: 134 mmol/L — ABNORMAL LOW (ref 135–145)

## 2022-08-20 LAB — LACTATE DEHYDROGENASE: LDH: 137 U/L (ref 98–192)

## 2022-08-20 LAB — PROTIME-INR
INR: 1 (ref 0.8–1.2)
Prothrombin Time: 13.1 seconds (ref 11.4–15.2)

## 2022-08-20 MED ORDER — ALUM & MAG HYDROXIDE-SIMETH 200-200-20 MG/5ML PO SUSP
30.0000 mL | ORAL | Status: DC | PRN
  Administered 2022-08-20 – 2022-08-26 (×4): 30 mL via ORAL
  Filled 2022-08-20 (×4): qty 30

## 2022-08-20 MED ORDER — CEFAZOLIN IV (FOR PTA / DISCHARGE USE ONLY): 2.0000 g | Freq: Three times a day (TID) | INTRAVENOUS | 0 refills | Status: AC

## 2022-08-20 MED ORDER — WARFARIN SODIUM 5 MG PO TABS
10.0000 mg | ORAL_TABLET | Freq: Once | ORAL | Status: AC
  Administered 2022-08-20: 10 mg via ORAL
  Filled 2022-08-20: qty 2

## 2022-08-20 MED ORDER — ALTEPLASE 2 MG IJ SOLR
2.0000 mg | Freq: Once | INTRAMUSCULAR | Status: AC
  Administered 2022-08-20: 2 mg
  Filled 2022-08-20: qty 2

## 2022-08-20 NOTE — Progress Notes (Signed)
Mobility Specialist Progress Note    08/20/22 1529  Mobility  Activity Ambulated independently in hallway  Level of Assistance Independent after set-up  Assistive Device None  Distance Ambulated (ft) 1200 ft  Activity Response Tolerated well  Mobility Referral Yes  $Mobility charge 1 Mobility   Pre-Mobility: 84 HR, 94% SpO2  Pt received in bed and agreeable. No complaints on walk. Returned to chair with call Wille in reach.    [name] Mobility Specialist  Please Psychologist, sport and exercise or Rehab Office at [phone]

## 2022-08-20 NOTE — Progress Notes (Signed)
2 Days Post-Op Procedure(s) (LRB): ABDOMINAL WOUND DEBRIDEMENT (N/A) WOUND VAC CHANGE (N/A) Subjective: Patient still having abdominal wall pain and tenderness.  Minimal wound VAC drainage and no more significant bleeding since wound VAC change yesterday. INR remains low 1.0 after 8 mg of Coumadin last p.m. Patient needs to continue IV antibiotics for at least 3 weeks due to the indurated cellulitis of his abdominal wall centered on the VAD tunnel. Objective: Vital signs in last 24 hours: Temp:  [97.8 F (36.6 C)-98 F (36.7 C)] 97.8 F (36.6 C) (11/16 1100) Pulse Rate:  [76-136] 90 (11/16 1100) Cardiac Rhythm: Normal sinus rhythm (11/16 0705) Resp:  [17-28] 28 (11/16 1100) BP: (92-130)/(72-117) 116/84 (11/16 1106) SpO2:  [93 %-97 %] 94 % (11/16 1100) Weight:  [130.3 kg] 130.3 kg (11/16 0630)  Hemodynamic parameters for last 24 hours:  Afebrile  Intake/Output from previous day: 11/15 0701 - 11/16 0700 In: 748.2 [P.O.:240; I.V.:305.3; IV Piggyback:202.9] Out: 1600 [Urine:1550; Drains:50] Intake/Output this shift: Total I/O In: 240 [P.O.:240] Out: 425 [Urine:400; Drains:25]       Exam    General- alert and comfortable.  Wound VAC sponge contracted.    Neck- no JVD, no cervical adenopathy palpable, no carotid bruit   Lungs- clear without rales, wheezes   Cor- regular rate and rhythm, no murmur , gallop normal VAD hum   Abdomen- soft, mid abdominal wall remains-tender   Extremities - warm, non-tender, minimal edema   Neuro- oriented, appropriate, no focal weakness   Lab Results: Recent Labs    08/19/22 0540 08/20/22 0540  WBC 10.7* 10.3  HGB 13.1 12.7*  HCT 39.3 37.9*  PLT 322 295   BMET:  Recent Labs    08/19/22 1200 08/20/22 0540  NA 138 134*  K 4.4 4.4  CL 105 102  CO2 25 25  GLUCOSE 128* 101*  BUN 31* 35*  CREATININE 1.50* 1.53*  CALCIUM 10.1 9.6    PT/INR:  Recent Labs    08/20/22 0540  LABPROT 13.1  INR 1.0   ABG    Component Value  Date/Time   PHART 7.368 07/25/2021 0436   HCO3 28.9 (H) 07/25/2021 0436   TCO2 30 07/25/2021 0436   ACIDBASEDEF 1.0 07/24/2021 0449   O2SAT 73.7 08/05/2021 0515   CBG (last 3)  Recent Labs    08/19/22 2124 08/20/22 0614 08/20/22 1101  GLUCAP 127* 110* 118*    Assessment/Plan: S/P Procedure(s) (LRB): ABDOMINAL WOUND DEBRIDEMENT (N/A) WOUND VAC CHANGE (N/A) Continue IV heparin bridge Continue IV antibiotics Continue wound VAC therapy of his abdominal wall wound.   LOS: 10 days    Dahlia Byes 08/20/2022

## 2022-08-20 NOTE — Progress Notes (Signed)
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 Cyndia Bent under Destination Therapy criteria due to BMI and chronic kidney disease.    Wound vac changed at bedside yesterday due to bleeding. No recurrent bleeding since. Patient still reports intermittent pain at site.   Currently on Cefazolin 2g Q 8 hrs per ID.   Pt will need wound vac and home IV antibiotics at hospital discharge. Home wound vac at bedside. Home health arranged with [facility] and [facility].   Vital signs: Temp:  97.8 HR: 79 NSR Doppler: none documented Auto cuff: 101/72 (81) O2 Sat: 97% on RA Wt: 298>300.9>300.4>300.9>290.4>286.4>285.7>287.3 lbs   LVAD interrogation reveals:  Speed: 6200 Flow: 5.3 Power:  5.2 w PI: 2.1 Hct: 39   Alarms:  none Events: rare   Fixed speed:  6200 Low speed limit: 5900   Drive Line: Existing VAC dressing CDI. Wound suction set at -100. Good seal achieved. No leak or blockage alarms noted. Anchor correctly applied. Plan for wound vac change next week at bedside or in VAD clinic if discharged later this week.   Labs:  LDH trend: 130>147>128>126>149>147>186>137   INR trend: 1.4>1.2>1.1>1.0>1.1>1.0>1.0  WBC: 11.5>10.9>9.1>7.1>7.2>7.6>10.7>10.3  Anticoagulation Plan: -INR Goal: 2.0 - 2.5  -ASA Dose: none due to allergy     Blood Products:     Device: - [employer] single ICD -Therapies: on per BS rep on 08/06/21     Infection:  08/10/22>>driveline cx -- FEW STAPHYLOCOCCUS AUREUS  08/10/22>>BC x 2 -- NO GROWTH x 5 DAYS; final 08/12/22>> driveline cx OR -- NGTD; final 08/18/22>> driveline cx OR)-- no growth < 24 hours  Drips:  Heparin 5 units/hr  Plan/Recommendations:  Call VAD Coordinator if any VAD equipment or drive line issues. Call VAD coordinator if any wound vac issues.  Bobbye Morton RN,BSN [place] Coordinator   Office: [phone]  24/7 Pager: [phone]

## 2022-08-20 NOTE — Progress Notes (Signed)
[place] for Heparin & Coumadin Indication:  VAD  Allergies  Allergen Reactions   Aspirin Shortness Of Breath, Itching and Rash     Burning sensation (Patient reports he tolerates other NSAIDS)    Bee Venom Hives and Swelling   Lisinopril Cough   Shrimp (Diagnostic) Rash    Patient Measurements: Height: '5\' 7"'$  (170.2 cm) Weight: 130.3 kg (287 lb 4.2 oz) IBW/kg (Calculated) : 66.1 Heparin Dosing Weight: 98 kg  Vital Signs: Temp: 97.8 F (36.6 C) (11/16 0554) Temp Source: Oral (11/16 0554) BP: 110/96 (11/16 0554) Pulse Rate: 86 (11/16 0554)  Labs: Recent Labs    08/18/22 0340 08/19/22 0540 08/19/22 1200 08/20/22 0540  HGB 13.1 13.1  --  12.7*  HCT 38.9* 39.3  --  37.9*  PLT 300 322  --  295  LABPROT 13.8 13.4  --  13.1  INR 1.1 1.0  --  1.0  HEPARINUNFRC <0.10* <0.10*  --  <0.10*  CREATININE 1.42*  --  1.50* 1.53*    Estimated Creatinine Clearance: 77.5 mL/min (A) (by C-G formula based on SCr of 1.53 mg/dL (H)).  Assessment: 46 yo male with a VAD HM3, presents to clinic w/ signs of  abdominal wall cellulitis and DL infection. + drainage from site. + low grade fever 99.8.  Patient is managed as an outpatient on Warfarin therapy. Pharmacy consulted to start a heparin infusion when INR < 2.  INR remains subtherapeutic at 1.0. Warfarin restarted on 11/14. Heparin level is undetectable on heparin infusion at 500 units/hr. Hgb 13s, plt WNL, LDH stable at 137. Had 50 mL of blood in wound Vac canister.    PTA Warfarin - 8 mg daily, except 4 mg on Tues and Sat.  Goal of Therapy:  Heparin level < 0.3 INR 2-2.5 post debridement  Monitor platelets by anticoagulation protocol: Yes   Plan:  Continue IV heparin at low rate of 500 units/hr as ordered.  Order Warfarin 10 mg po x1 tonight. Daily PT/INR, heparin level, and CBC.  Antonietta Jewel, PharmD, [place] Clinical Pharmacist  Phone: [phone] 08/20/2022 7:36 AM  Please check AMION  for all [place] phone numbers After 10:00 PM, call [place] [phone]

## 2022-08-20 NOTE — Progress Notes (Addendum)
Advanced Heart Failure VAD Team Note  PCP-Cardiologist: Glori Bickers, MD   Subjective:    11/6: Admit w/ DL infection. CT with diffuse soft-tissue involvement but no overt abscess. Wound Cx growing few staph aureus. BCx NG. 11/8: s/p extensive debridement + wound vac. Wound Cx from OR NG. 11/14: OR for debridement + wound vac exchange 11/15: Bleeding at Wound vac site. Vessels stitched and vac changed.    On heparin. INR 1.0.   MAP mostly 80s-90s, one measurement 102 this am.  50 cc blood in VAC canister.  Wound very tender. Has PRN pain meds.   LVAD INTERROGATION:  HeartMate III LVAD:   Flow 5.2 liters/min, speed 6200, power 5 , PI 2.3. 5 PI events this am. VAD interrogated personally.   Objective:    Vital Signs:   Temp:  [97.7 F (36.5 C)-98.1 F (36.7 C)] 97.8 F (36.6 C) (11/16 0554) Pulse Rate:  [72-136] 86 (11/16 0554) Resp:  [17-20] 20 (11/16 0554) BP: (92-113)/(76-96) 110/96 (11/16 0554) SpO2:  [91 %-96 %] 93 % (11/16 0554) Weight:  [130.3 kg] 130.3 kg (11/16 0630) Last BM Date : 08/19/22 Mean arterial Pressure mostly 80s-90s  Intake/Output:   Intake/Output Summary (Last 24 hours) at 08/20/2022 0707 Last data filed at 08/20/2022 0549 Gross per 24 hour  Intake 748.2 ml  Output 1200 ml  Net -451.8 ml     Physical Exam   Physical Exam: GENERAL: Sitting up in bed. No distress. HEENT: normal  NECK: Supple, JVP not elevated.  2+ bilaterally, no bruits.   CARDIAC:  Mechanical heart sounds with LVAD hum present.  LUNGS:  Clear to auscultation bilaterally.  ABDOMEN:  Soft, round, nontender, positive bowel sounds x4.     LVAD exit site: Wound VAC present and working well. Stabilization device present and accurately applied.   EXTREMITIES:  Warm and dry, no cyanosis, clubbing, rash or edema, + RUE PICC NEUROLOGIC:  Alert and oriented x 4.  No aphasia.  No dysarthria.  Affect pleasant.      Telemetry   SR 70s-80s  Labs   Basic Metabolic  Panel: Recent Labs  Lab 08/16/22 0550 08/17/22 0450 08/18/22 0340 08/19/22 1200 08/20/22 0540  NA 139 137 138 138 134*  K 4.4 4.3 4.2 4.4 4.4  CL 106 104 104 105 102  CO2 '26 26 27 25 25  '$ GLUCOSE 102* 99 107* 128* 101*  BUN 17 19 24* 31* 35*  CREATININE 1.26* 1.34* 1.42* 1.50* 1.53*  CALCIUM 10.0 10.0 10.4* 10.1 9.6    Liver Function Tests: No results for input(s): "AST", "ALT", "ALKPHOS", "BILITOT", "PROT", "ALBUMIN" in the last 168 hours.  No results for input(s): "LIPASE", "AMYLASE" in the last 168 hours. No results for input(s): "AMMONIA" in the last 168 hours.  CBC: Recent Labs  Lab 08/16/22 0550 08/17/22 0450 08/18/22 0340 08/19/22 0540 08/20/22 0540  WBC 7.3 7.2 7.6 10.7* 10.3  HGB 12.6* 13.0 13.1 13.1 12.7*  HCT 37.4* 38.7* 38.9* 39.3 37.9*  MCV 90.1 90.2 89.4 89.9 91.1  PLT 279 312 300 322 295    INR: Recent Labs  Lab 08/16/22 0550 08/17/22 0450 08/18/22 0340 08/19/22 0540 08/20/22 0540  INR 1.0 1.0 1.1 1.0 1.0    Other results:   Imaging   No results found.   Medications:     Scheduled Medications:  allopurinol  100 mg Oral Daily   amiodarone  200 mg Oral Daily   amLODipine  10 mg Oral Daily   Chlorhexidine Gluconate Cloth  6 each Topical Daily   colchicine  0.6 mg Oral Daily   doxazosin  1 mg Oral Daily   gabapentin  300 mg Oral TID   insulin aspart  0-9 Units Subcutaneous TID WC   isosorbide-hydrALAZINE  2 tablet Oral TID   metFORMIN  1,000 mg Oral BID WC   multivitamin with minerals  1 tablet Oral Daily   nutrition supplement (JUVEN)  1 packet Oral BID BM   pantoprazole  40 mg Oral Daily   sacubitril-valsartan  1 tablet Oral BID   sertraline  150 mg Oral Daily   sodium chloride flush  10-40 mL Intracatheter Q12H   spironolactone  25 mg Oral Daily   traZODone  100 mg Oral QHS   Warfarin - Pharmacist Dosing Inpatient   Does not apply q1600   zinc sulfate  220 mg Oral Daily    Infusions:  sodium chloride 10 mL/hr at  08/20/22 0441    ceFAZolin (ANCEF) IV 2 g (08/20/22 0549)   heparin 500 Units/hr (08/20/22 0441)    PRN Medications: sodium chloride, acetaminophen, hydrALAZINE, HYDROcodone-acetaminophen, morphine injection, naloxone, mouth rinse, oxyCODONE, sodium chloride flush, traMADol   Patient Profile   46 y.o. male with a history of  poorly controlled HTN, R renal cell carcinoma s/p nephrectomy, CKD IIIa, DM2, OSA, gout, morbid obesity and systolic HF due to NICM. 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    Assessment/Plan:    1. 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 Bedside wound vac change and vessel cauterization 11/15 2/2 hematoma.  - will need to discharge with IV cefazolin through PICC line (6 weeks from 11/14) then transition to cefadroxil 500 mg PO bid for chronic suppression per ID. VAC will be monitored through VAD clinic. Dr. Darcey Nora following. - Home wound vac in room and [facility] arranged  2.  Chronic systolic CHF  - Nonischemic CM, end-stage  - EF 2012; 20-25%  - Echo  8/22: EF < 20%, severe LV dilation, moderate RV dysfunction.   - RHC (8/22): well compensated hemodynamics.  - s/p S-ICD  - S/p HM3 LVAD 10/17 (DT)  - Continue Bidil 2 tabs tid  - Continue spironolactone 25 mg daily  - Continue entresto 97/103 bid  - Resume ozempic after discharge.  - Volume status stable. Holding diuretics. Ok to resume home PRN torsemide 20 gm daily at d/c  3. VAD  - s/p HM-3 placement 07/21/21  - VAD interrogated personally. Parameters stable. - LDH stable - Continue heparin and coumadin. INR 1.0 today   4  Atrial fibrillation: Paroxysmal  - Had AF with RVR 7/22 s/p DC-CV x 2  - Seen by EP. Not a candidate for ablation due to body habitus  (could consider AVN ablation and CRT, if needed)  - Had post-op AF with RVR post VAD  - 10/23 S/P DC-CV with restoration of NSR.  - Remains in NSR. Continue amio 200 daily  - On coumadin as above. Continue heparin until INR closer to therapeutic range.   5. HTN  - MAPs ok today - If MAP > 100 can give IV hydralazine  prn   6. CKD stage 3B  - Has solitary kidney.   - Baseline creatinine is 1.5-1.8.  - Scr 1.53 today - holding diuretic  7. OSA  - Continue CPAP    8. DM2  - On Jardiance.  - Hgb A1c 9.4. -> 5.9  - PCP following    9. Polyarticular gout  - quiescent    10. Depression/anxiety  - Continue Zoloft '150mg'$  daily    11. Morbid obesity  -Body mass index is 44.99 kg/m. - Holding GLP1RA for OR (last dose 11/2)  I reviewed the LVAD parameters from today, and compared the results to the patient's prior recorded data.  No programming changes were made.  The LVAD is functioning within specified parameters.  The patient performs LVAD self-test daily.  LVAD interrogation was negative for any significant power changes, alarms or PI events/speed drops.  LVAD equipment check completed and is in good working order.  Back-up equipment present.   LVAD education done on emergency procedures and precautions and reviewed exit site care.  Length of Stay: [place], PA-C 7:07 AM  VAD Team --- VAD ISSUES ONLY--- Pager [phone] (7am - 7am)  Advanced Heart Failure Team  Pager [phone] (M-F; 7a - 5p)  Please contact [facility] for night-coverage after hours (5p -7a ) and weekends on [email or web address]  Patient seen and examined with the above-signed Advanced Practice Provider and/or Housestaff. I personally reviewed laboratory data, imaging studies and relevant notes. I independently examined the patient and formulated the important aspects of the  plan. I have edited the note to reflect any of my changes or salient points. I have personally discussed the plan with the patient and/or family.  Wound vac changed yesterday. Site cauterized and sutured for bleeding. Back on heparin. Bleeding has stopped. Having pain at site. Remains on IV abx.   General:  NAD.  HEENT: normal  Neck: supple. JVP not elevated.  Carotids 2+ bilat; no bruits. No lymphadenopathy or thryomegaly appreciated. Cor: LVAD hum.  Lungs: Clear. Abdomen: obese soft, + tender, non-distended. No hepatosplenomegaly. No bruits or masses. Good bowel sounds. Large wound vac  Extremities: no cyanosis, clubbing, rash. Warm no edema  Neuro: alert & oriented x 3. No focal deficits. Moves all 4 without problem   Continue wound vac, Site improved. No further bleeding. Continue heparin/warfarin. Discussed dosing with PharmD personally.  VAD interrogated personally. Parameters stable.  Glori Bickers, MD  11:41 PM

## 2022-08-21 DIAGNOSIS — T827XXA Infection and inflammatory reaction due to other cardiac and vascular devices, implants and grafts, initial encounter: Secondary | ICD-10-CM | POA: Diagnosis not present

## 2022-08-21 LAB — GLUCOSE, CAPILLARY
Glucose-Capillary: 111 mg/dL — ABNORMAL HIGH (ref 70–99)
Glucose-Capillary: 99 mg/dL (ref 70–99)
Glucose-Capillary: 93 mg/dL (ref 70–99)
Glucose-Capillary: 106 mg/dL — ABNORMAL HIGH (ref 70–99)

## 2022-08-21 LAB — CBC
HCT: 37.7 % — ABNORMAL LOW (ref 39.0–52.0)
Hemoglobin: 12.1 g/dL — ABNORMAL LOW (ref 13.0–17.0)
MCH: 29.6 pg (ref 26.0–34.0)
MCHC: 32.1 g/dL (ref 30.0–36.0)
MCV: 92.2 fL (ref 80.0–100.0)
Platelets: 284 10*3/uL (ref 150–400)
RBC: 4.09 MIL/uL — ABNORMAL LOW (ref 4.22–5.81)
RDW: 14.3 % (ref 11.5–15.5)
WBC: 9.8 10*3/uL (ref 4.0–10.5)
nRBC: 0 % (ref 0.0–0.2)

## 2022-08-21 LAB — BASIC METABOLIC PANEL
Anion gap: 7 (ref 5–15)
BUN: 40 mg/dL — ABNORMAL HIGH (ref 6–20)
CO2: 27 mmol/L (ref 22–32)
Calcium: 9.9 mg/dL (ref 8.9–10.3)
Chloride: 104 mmol/L (ref 98–111)
Creatinine, Ser: 1.5 mg/dL — ABNORMAL HIGH (ref 0.61–1.24)
GFR, Estimated: 57 mL/min — ABNORMAL LOW (ref >60)
Glucose, Bld: 104 mg/dL — ABNORMAL HIGH (ref 70–99)
Potassium: 4.6 mmol/L (ref 3.5–5.1)
Sodium: 138 mmol/L (ref 135–145)

## 2022-08-21 LAB — HEPARIN LEVEL (UNFRACTIONATED): Heparin Unfractionated: <0.1 IU/mL — ABNORMAL LOW (ref 0.30–0.70)

## 2022-08-21 LAB — PROTIME-INR
INR: 1.1 (ref 0.8–1.2)
Prothrombin Time: 13.7 seconds (ref 11.4–15.2)

## 2022-08-21 LAB — LACTATE DEHYDROGENASE: LDH: 149 U/L (ref 98–192)

## 2022-08-21 MED ORDER — WARFARIN SODIUM 5 MG PO TABS
10.0000 mg | ORAL_TABLET | Freq: Once | ORAL | Status: AC
  Administered 2022-08-21: 10 mg via ORAL
  Filled 2022-08-21: qty 2

## 2022-08-21 NOTE — Progress Notes (Signed)
Mobility Specialist Progress Note    08/21/22 0932  Mobility  Activity Ambulated independently in hallway  Level of Assistance Independent  Assistive Device None  Distance Ambulated (ft) 1600 ft  Activity Response Tolerated well  Mobility Referral Yes  $Mobility charge 1 Mobility   Pt received sitting EOB and agreeable. No complaints on walk. Returned to sitting EOB with call [place] in reach.    [name] Mobility Specialist  Please Psychologist, sport and exercise or Rehab Office at [phone]

## 2022-08-21 NOTE — Progress Notes (Signed)
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 Cyndia Bent under Destination Therapy criteria due to BMI and chronic kidney disease.    Pt up walking around room completing ADLs. Reports pain at wound vac site. PRN pain medication helps with this. Wound vac changed at bedside 11/15 due to bleeding. No recurrent bleeding noted. Minimal serosanguinous drainage noted in canister.   Plan for wound vac change at bedside per Dr Prescott Gum Monday morning at 10:00.   Currently on Cefazolin 2g Q 8 hrs per ID. Will need to discharge with IV Cefazolin through PICC line (6 weeks from 11/14) then transition to Cefadroxil 500 mg PO BID for chronic suppression per ID.   Pt will need wound vac and home IV antibiotics at hospital discharge. Home wound vac at bedside. Home health arranged with [facility] and [facility].   Vital signs: Temp:  97.9 HR: 72 NSR Doppler: none documented Auto cuff: 105/50 (62) O2 Sat: 96% on RA Wt: 298>300.9>300.4>300.9>290.4>286.4>285.7>287.3>288.5 lbs   LVAD interrogation reveals:  Speed: 6200 Flow: 5.1 Power:  5.2 w PI: 3.1 Hct: 38   Alarms:  none Events: rare   Fixed speed:  6200 Low speed limit: 5900   Drive Line: Existing VAC dressing CDI. Wound suction set at -100. Good seal achieved. No leak or blockage alarms noted. Anchor correctly applied. Plan for wound vac change at bedside Monday 11/20 at 10:00 per Dr Prescott Gum.   Labs:  LDH trend: 130>147>128>126>149>147>186>137>149   INR trend: 1.4>1.2>1.1>1.0>1.1>1.0>1.0>1.1  WBC: 11.5>10.9>9.1>7.1>7.2>7.6>10.7>10.3>12.1  Anticoagulation Plan: -INR Goal: 2.0 - 2.5  -ASA Dose: none due to allergy     Blood Products:     Device: - [employer] single ICD -Therapies: on per BS rep on 08/06/21     Infection:  08/10/22>>driveline cx -- FEW STAPHYLOCOCCUS AUREUS   08/10/22>>BC x 2 -- NO GROWTH x 5 DAYS; final 08/12/22>> driveline cx OR -- NGTD; final 08/18/22>> driveline cx OR)-- no growth 2 days; final pending  Drips:  Heparin 500 units/hr  Plan/Recommendations:  Call VAD Coordinator if any VAD equipment or drive line issues. Call VAD coordinator if any wound vac issues. Plan for wound vac change at bedside 11/20 at 10:00  [place]: [phone]  24/7 Pager: [phone]

## 2022-08-21 NOTE — Progress Notes (Addendum)
Advanced Heart Failure VAD Team Note  PCP-Cardiologist: Glori Bickers, MD   Subjective:    11/6: Admit w/ DL infection. CT with diffuse soft-tissue involvement but no overt abscess. Wound Cx growing few staph aureus. BCx NG. 11/8: s/p extensive debridement + wound vac. Wound Cx from OR NG. 11/14: OR for debridement + wound vac exchange 11/15: Bleeding at Wound vac site. Vessels stitched and vac changed.    On heparin. INR 1.1.    LVAD INTERROGATION:  HeartMate III LVAD:   Flow 5.2 liters/min, speed 6200, power 5 , PI 2.5 5 PI events this am. VAD interrogated personally.   Objective:    Vital Signs:   Temp:  [97.8 F (36.6 C)-98.9 F (37.2 C)] 97.9 F (36.6 C) (11/17 0835) Pulse Rate:  [72-154] 72 (11/17 0835) Resp:  [17-28] 20 (11/17 0835) BP: (102-130)/(50-117) 105/50 (11/17 0835) SpO2:  [94 %-97 %] 96 % (11/17 0835) Weight:  [130.9 kg] 130.9 kg (11/17 0600) Last BM Date : 08/20/22 Mean arterial Pressure- 90  Intake/Output:   Intake/Output Summary (Last 24 hours) at 08/21/2022 0959 Last data filed at 08/21/2022 4098 Gross per 24 hour  Intake 1261.56 ml  Output 1860 ml  Net -598.44 ml     Physical Exam    Physical Exam: GENERAL: No acute distress. HEENT: normal  NECK: Supple, JVP flat \ .  2+ bilaterally, no bruits.  No lymphadenopathy or thyromegaly appreciated.   CARDIAC:  Mechanical heart sounds with LVAD hum present.  LUNGS:  Clear to auscultation bilaterally.  ABDOMEN:  Soft, round, nontender, positive bowel sounds x4.     LVAD exit site: VAC dressing. No erythema or drainage.  Stabilization device present and accurately applied.  Driveline dressing is being changed daily per sterile technique. EXTREMITIES:  Warm and dry, no cyanosis, clubbing, rash or edema  NEUROLOGIC:  Alert and oriented x 3.    No aphasia.  No dysarthria.  Affect pleasant.    .      Telemetry   SR 70s-80s  Labs   Basic Metabolic Panel: Recent Labs  Lab  08/17/22 0450 08/18/22 0340 08/19/22 1200 08/20/22 0540 08/21/22 0545  NA 137 138 138 134* 138  K 4.3 4.2 4.4 4.4 4.6  CL 104 104 105 102 104  CO2 '26 27 25 25 27  '$ GLUCOSE 99 107* 128* 101* 104*  BUN 19 24* 31* 35* 40*  CREATININE 1.34* 1.42* 1.50* 1.53* 1.50*  CALCIUM 10.0 10.4* 10.1 9.6 9.9    Liver Function Tests: No results for input(s): "AST", "ALT", "ALKPHOS", "BILITOT", "PROT", "ALBUMIN" in the last 168 hours.  No results for input(s): "LIPASE", "AMYLASE" in the last 168 hours. No results for input(s): "AMMONIA" in the last 168 hours.  CBC: Recent Labs  Lab 08/17/22 0450 08/18/22 0340 08/19/22 0540 08/20/22 0540 08/21/22 0545  WBC 7.2 7.6 10.7* 10.3 9.8  HGB 13.0 13.1 13.1 12.7* 12.1*  HCT 38.7* 38.9* 39.3 37.9* 37.7*  MCV 90.2 89.4 89.9 91.1 92.2  PLT 312 300 322 295 284    INR: Recent Labs  Lab 08/17/22 0450 08/18/22 0340 08/19/22 0540 08/20/22 0540 08/21/22 0545  INR 1.0 1.1 1.0 1.0 1.1    Other results:   Imaging   No results found.   Medications:     Scheduled Medications:  allopurinol  100 mg Oral Daily   amiodarone  200 mg Oral Daily   amLODipine  10 mg Oral Daily   Chlorhexidine Gluconate Cloth  6 each Topical Daily  colchicine  0.6 mg Oral Daily   doxazosin  1 mg Oral Daily   gabapentin  300 mg Oral TID   insulin aspart  0-9 Units Subcutaneous TID WC   isosorbide-hydrALAZINE  2 tablet Oral TID   metFORMIN  1,000 mg Oral BID WC   multivitamin with minerals  1 tablet Oral Daily   nutrition supplement (JUVEN)  1 packet Oral BID BM   pantoprazole  40 mg Oral Daily   sacubitril-valsartan  1 tablet Oral BID   sertraline  150 mg Oral Daily   sodium chloride flush  10-40 mL Intracatheter Q12H   spironolactone  25 mg Oral Daily   traZODone  100 mg Oral QHS   Warfarin - Pharmacist Dosing Inpatient   Does not apply q1600   zinc sulfate  220 mg Oral Daily    Infusions:  sodium chloride 10 mL/hr at 08/21/22 9622    ceFAZolin  (ANCEF) IV Stopped (08/21/22 2979)   heparin 500 Units/hr (08/21/22 0621)    PRN Medications: sodium chloride, acetaminophen, alum & mag hydroxide-simeth, hydrALAZINE, HYDROcodone-acetaminophen, morphine injection, naloxone, mouth rinse, oxyCODONE, sodium chloride flush, traMADol   Patient Profile   46 y.o. male with a history of  poorly controlled HTN, R renal cell carcinoma s/p nephrectomy, CKD IIIa, DM2, OSA, gout, morbid obesity and systolic HF due to NICM. 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    Assessment/Plan:    1. 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 Bedside wound vac change and vessel cauterization 11/15 2/2 hematoma.  - will need to discharge with IV cefazolin through PICC line (6 weeks from 11/14) then transition to cefadroxil 500 mg PO bid for chronic suppression per ID. VAC will be monitored through VAD clinic. Dr. Darcey Nora following. - Home wound vac in room and [facility] arranged  2.  Chronic systolic CHF  - Nonischemic CM, end-stage  - EF 2012; 20-25%  - Echo  8/22: EF < 20%, severe LV dilation, moderate RV dysfunction.   - RHC (8/22): well compensated hemodynamics.  - s/p S-ICD  - S/p HM3 LVAD 10/17 (DT)  - Appears euvolemic. Hold diuretics.  - Continue Bidil 2 tabs tid  - Continue spironolactone 25 mg daily  - Continue entresto 97/103 bid  - Resume ozempic after discharge.  - Renal function stable.   3. VAD  - s/p HM-3 placement 07/21/21  - VAD interrogated personally. Parameters stable. - LDH stable.  - Continue heparin and coumadin. INR 1.1 today   4 Atrial fibrillation:  Paroxysmal  - Had AF with RVR 7/22 s/p DC-CV x 2  - Seen by EP. Not a candidate for ablation due to body habitus  (could consider AVN ablation and CRT, if needed)  - Had post-op AF with RVR post VAD  - 10/23 S/P DC-CV with restoration of NSR.  - IN SR . Continue amio 200 daily  - On coumadin as above. Continue heparin until INR closer to therapeutic range.   5. HTN  - MAPs 90. Ok  - If MAP > 100 can give IV hydralazine prn   6. CKD stage 3B  -  Has solitary kidney.   - Baseline creatinine is 1.5-1.8.  - Stable 1.5 today   7. OSA  - Continue CPAP    8. DM2  - On Jardiance.  - Hgb A1c 9.4. -> 5.9  - PCP following    9. Polyarticular gout  - quiescent    10. Depression/anxiety  - Continue Zoloft '150mg'$  daily    11. Morbid obesity  -Body mass index is 45.2 kg/m. - Holding GLP1RA for OR (last dose 11/2)  I reviewed the LVAD parameters from today, and compared the results to the patient's prior recorded data.  No programming changes were made.  The LVAD is functioning within specified parameters.  The patient performs LVAD self-test daily.  LVAD interrogation was negative for any significant power changes, alarms or PI events/speed drops.  LVAD equipment check completed and is in good working order.  Back-up equipment present.   LVAD education done on emergency procedures and precautions and reviewed exit site care.  Length of Stay: [place] NP-C  9:59 AM  VAD Team --- VAD ISSUES ONLY--- Pager [phone] (7am - 7am)  Advanced Heart Failure Team  Pager [phone] (M-F; 7a - 5p)  Please contact [facility] for night-coverage after hours (5p -7a ) and weekends on [email or web address]  Patient seen and examined with the above-signed Advanced Practice Provider and/or Housestaff. I personally reviewed laboratory data, imaging studies and relevant notes. I independently examined the patient and formulated the important aspects of the plan. I have edited the note to reflect any of my  changes or salient points. I have personally discussed the plan with the patient and/or family.   Having wound vac alarms. Moderate pain at site. No fevers or chills. On IV abx  General:  NAD.  HEENT: normal  Neck: supple. JVP not elevated.  Carotids 2+ bilat; no bruits. No lymphadenopathy or thryomegaly appreciated. Cor: LVAD hum.  Lungs: Clear. Abdomen: obese soft, nontender, non-distended. No hepatosplenomegaly. No bruits or masses. Good bowel sounds.Wound vac looks good  Extremities: no cyanosis, clubbing, rash. Warm no edema  Neuro: alert & oriented x 3. No focal deficits. Moves all 4 without problem   I turned up wound vac to 125 and suction alarm resolved. Will continue IV abx. Continue heparin/warfarin. INR 1.1 Discussed dosing with PharmD personally. VAD interrogated personally. Parameters stable.  Glori Bickers, MD  7:30 PM

## 2022-08-21 NOTE — TOC CM/SW Note (Addendum)
HF TOC CM updated [facility] rep, Pam of scheduled dc on 08/24/22 to home. [employer] wound vac in room. Will request meds come up from [facility] at dc. Wife to provide transportation home. [place], Heart Failure TOC CM [phone]

## 2022-08-21 NOTE — Progress Notes (Signed)
[place] for Heparin & Coumadin Indication:  VAD  Allergies  Allergen Reactions   Aspirin Shortness Of Breath, Itching and Rash     Burning sensation (Patient reports he tolerates other NSAIDS)    Bee Venom Hives and Swelling   Lisinopril Cough   Shrimp (Diagnostic) Rash    Patient Measurements: Height: '5\' 7"'$  (170.2 cm) Weight: 130.9 kg (288 lb 9.3 oz) IBW/kg (Calculated) : 66.1 Heparin Dosing Weight: 98 kg  Vital Signs: Temp: 98.1 F (36.7 C) (11/17 0600) Temp Source: Oral (11/17 0600) BP: 120/77 (11/17 0600) Pulse Rate: 80 (11/17 0600)  Labs: Recent Labs    08/19/22 0540 08/19/22 1200 08/20/22 0540 08/21/22 0545  HGB 13.1  --  12.7* 12.1*  HCT 39.3  --  37.9* 37.7*  PLT 322  --  295 284  LABPROT 13.4  --  13.1 13.7  INR 1.0  --  1.0 1.1  HEPARINUNFRC <0.10*  --  <0.10* <0.10*  CREATININE  --  1.50* 1.53* 1.50*    Estimated Creatinine Clearance: 79.2 mL/min (A) (by C-G formula based on SCr of 1.5 mg/dL (H)).  Assessment: 46 yo male with a VAD HM3, presents to clinic w/ signs of  abdominal wall cellulitis and DL infection. + drainage from site. + low grade fever 99.8.  Patient is managed as an outpatient on Warfarin therapy. Pharmacy consulted to start a heparin infusion when INR < 2.  INR remains subtherapeutic at 1.1. Warfarin restarted on 11/14. Heparin level is undetectable on heparin infusion at 500 units/hr. Hgb 12.1, plt WNL, LDH stable at 149.   PTA Warfarin - 8 mg daily, except 4 mg on Tues and Sat.  Goal of Therapy:  Heparin level < 0.3 INR 2-2.5 post debridement  Monitor platelets by anticoagulation protocol: Yes   Plan:  Continue IV heparin at low rate of 500 units/hr as ordered.  Order Warfarin 10 mg po x1 again tonight. Daily PT/INR, heparin level, and CBC.  Antonietta Jewel, PharmD, [place] Clinical Pharmacist  Phone: [phone] 08/21/2022 8:08 AM  Please check AMION for all [place] phone  numbers After 10:00 PM, call [place] [phone]

## 2022-08-21 NOTE — Progress Notes (Addendum)
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  INR is still lagging and patient remains on IV heparin bridge until INR therapeutic.  VAD pump parameters stable  Objective: Vital signs in last 24 hours: Temp:  [97.7 F (36.5 C)-98.9 F (37.2 C)] 97.7 F (36.5 C) (11/17 1058) Pulse Rate:  [72-154] 84 (11/17 1058) Cardiac Rhythm: Normal sinus rhythm (11/17 0700) Resp:  [15-26] 15 (11/17 1058) BP: (102-140)/(50-105) 140/105 (11/17 1117) SpO2:  [92 %-97 %] 92 % (11/17 1058) Weight:  [130.9 kg] 130.9 kg (11/17 0600)  Hemodynamic parameters for last 24 hours:    Intake/Output from previous day: 11/16 0701 - 11/17 0700 In: 1501.6 [P.O.:720; I.V.:381.6; IV Piggyback:400] Out: 1885 [Urine:1850; Drains:35] Intake/Output this shift: No intake/output data recorded.       Exam    General- alert and comfortable    Neck- no JVD, no cervical adenopathy palpable, no carotid bruit   Lungs- clear without rales, wheezes   Cor- regular rate and rhythm, no murmur , gallop normal VAD hum   Abdomen- soft, generalized tenderness slowly improving.  Wound VAC sponge compressed.   Extremities - warm, non-tender, minimal edema   Neuro- oriented, appropriate, no focal weakness   Lab Results: Recent Labs    08/20/22 0540 08/21/22 0545  WBC 10.3 9.8  HGB 12.7* 12.1*  HCT 37.9* 37.7*  PLT 295 284   BMET:  Recent Labs    08/20/22 0540 08/21/22 0545  NA 134* 138  K 4.4 4.6  CL 102 104  CO2 25 27  GLUCOSE 101* 104*  BUN 35* 40*  CREATININE 1.53* 1.50*  CALCIUM 9.6 9.9    PT/INR:  Recent Labs    08/21/22 0545  LABPROT 13.7  INR 1.1   ABG    Component Value Date/Time   PHART 7.368 07/25/2021 0436   HCO3 28.9 (H) 07/25/2021  0436   TCO2 30 07/25/2021 0436   ACIDBASEDEF 1.0 07/24/2021 0449   O2SAT 73.7 08/05/2021 0515   CBG (last 3)  Recent Labs    08/20/22 2108 08/21/22 0558 08/21/22 1100  GLUCAP 118* 111* 99    Assessment/Plan: S/P Procedure(s) (LRB): ABDOMINAL WOUND DEBRIDEMENT (N/A) WOUND VAC CHANGE (N/A) Continue wound VAC therapy, plan VAC change at bedside probably 1 more time before discharge Monday a.m. November 20  [place]er and off oxygen.  LOS: 11 days    Dahlia Byes 08/21/2022

## 2022-08-21 NOTE — Plan of Care (Signed)
  Problem: Education: Goal: Patient will understand all VAD equipment and how it functions Outcome: Progressing Goal: Patient will be able to verbalize current INR target range and antiplatelet therapy for discharge home Outcome: Progressing   Problem: Cardiac: Goal: LVAD will function as expected and patient will experience no clinical alarms Outcome: Progressing   Problem: Education: Goal: Knowledge of General Education information will improve Description: Including pain rating scale, medication(s)/side effects and non-pharmacologic comfort measures Outcome: Progressing   Problem: Health Behavior/Discharge Planning: Goal: Ability to manage health-related needs will improve Outcome: Progressing   Problem: Clinical Measurements: Goal: Ability to maintain clinical measurements within normal limits will improve Outcome: Progressing Goal: Will remain free from infection Outcome: Progressing Goal: Diagnostic test results will improve Outcome: Progressing Goal: Respiratory complications will improve Outcome: Progressing Goal: Cardiovascular complication will be avoided Outcome: Progressing   Problem: Activity: Goal: Risk for activity intolerance will decrease Outcome: Progressing   Problem: Nutrition: Goal: Adequate nutrition will be maintained Outcome: Progressing   Problem: Coping: Goal: Level of anxiety will decrease Outcome: Progressing   Problem: Elimination: Goal: Will not experience complications related to bowel motility Outcome: Progressing Goal: Will not experience complications related to urinary retention Outcome: Progressing   Problem: Pain Managment: Goal: General experience of comfort will improve Outcome: Progressing   Problem: Safety: Goal: Ability to remain free from injury will improve Outcome: Progressing   Problem: Skin Integrity: Goal: Risk for impaired skin integrity will decrease Outcome: Progressing   Problem: Education: Goal: Ability  to describe self-care measures that may prevent or decrease complications (Diabetes Survival Skills Education) will improve Outcome: Progressing Goal: Individualized Educational Video(s) Outcome: Progressing   Problem: Coping: Goal: Ability to adjust to condition or change in health will improve Outcome: Progressing   Problem: Fluid Volume: Goal: Ability to maintain a balanced intake and output will improve Outcome: Progressing   Problem: Health Behavior/Discharge Planning: Goal: Ability to identify and utilize available resources and services will improve Outcome: Progressing Goal: Ability to manage health-related needs will improve Outcome: Progressing   Problem: Metabolic: Goal: Ability to maintain appropriate glucose levels will improve Outcome: Progressing   Problem: Nutritional: Goal: Maintenance of adequate nutrition will improve Outcome: Progressing Goal: Progress toward achieving an optimal weight will improve Outcome: Progressing

## 2022-08-21 NOTE — Plan of Care (Signed)
  Problem: Cardiac: Goal: LVAD will function as expected and patient will experience no clinical alarms Outcome: Progressing   Problem: Clinical Measurements: Goal: Diagnostic test results will improve Outcome: Progressing Goal: Respiratory complications will improve Outcome: Progressing Goal: Cardiovascular complication will be avoided Outcome: Progressing   Problem: Activity: Goal: Risk for activity intolerance will decrease Outcome: Progressing   Problem: Coping: Goal: Level of anxiety will decrease Outcome: Progressing   Problem: Elimination: Goal: Will not experience complications related to urinary retention Outcome: Progressing   Problem: Pain Managment: Goal: General experience of comfort will improve Outcome: Progressing

## 2022-08-22 DIAGNOSIS — T827XXA Infection and inflammatory reaction due to other cardiac and vascular devices, implants and grafts, initial encounter: Secondary | ICD-10-CM | POA: Diagnosis not present

## 2022-08-22 LAB — GLUCOSE, CAPILLARY
Glucose-Capillary: 97 mg/dL (ref 70–99)
Glucose-Capillary: 103 mg/dL — ABNORMAL HIGH (ref 70–99)
Glucose-Capillary: 122 mg/dL — ABNORMAL HIGH (ref 70–99)
Glucose-Capillary: 104 mg/dL — ABNORMAL HIGH (ref 70–99)

## 2022-08-22 LAB — CBC
HCT: 39.1 % (ref 39.0–52.0)
Hemoglobin: 12.4 g/dL — ABNORMAL LOW (ref 13.0–17.0)
MCH: 29.3 pg (ref 26.0–34.0)
MCHC: 31.7 g/dL (ref 30.0–36.0)
MCV: 92.4 fL (ref 80.0–100.0)
Platelets: 293 10*3/uL (ref 150–400)
RBC: 4.23 MIL/uL (ref 4.22–5.81)
RDW: 14.3 % (ref 11.5–15.5)
WBC: 9.1 10*3/uL (ref 4.0–10.5)
nRBC: 0 % (ref 0.0–0.2)

## 2022-08-22 LAB — HEPARIN LEVEL (UNFRACTIONATED): Heparin Unfractionated: <0.1 IU/mL — ABNORMAL LOW (ref 0.30–0.70)

## 2022-08-22 LAB — BASIC METABOLIC PANEL
Anion gap: 10 (ref 5–15)
BUN: 40 mg/dL — ABNORMAL HIGH (ref 6–20)
CO2: 25 mmol/L (ref 22–32)
Calcium: 10 mg/dL (ref 8.9–10.3)
Chloride: 105 mmol/L (ref 98–111)
Creatinine, Ser: 1.62 mg/dL — ABNORMAL HIGH (ref 0.61–1.24)
GFR, Estimated: 52 mL/min — ABNORMAL LOW (ref >60)
Glucose, Bld: 107 mg/dL — ABNORMAL HIGH (ref 70–99)
Potassium: 4.8 mmol/L (ref 3.5–5.1)
Sodium: 140 mmol/L (ref 135–145)

## 2022-08-22 LAB — PROTIME-INR
INR: 1.2 (ref 0.8–1.2)
Prothrombin Time: 14.8 seconds (ref 11.4–15.2)

## 2022-08-22 LAB — LACTATE DEHYDROGENASE: LDH: 141 U/L (ref 98–192)

## 2022-08-22 MED ORDER — DOXAZOSIN MESYLATE 1 MG PO TABS
1.0000 mg | ORAL_TABLET | Freq: Once | ORAL | Status: AC
  Administered 2022-08-22: 1 mg via ORAL
  Filled 2022-08-22: qty 1

## 2022-08-22 MED ORDER — WARFARIN SODIUM 7.5 MG PO TABS
12.5000 mg | ORAL_TABLET | Freq: Once | ORAL | Status: AC
  Administered 2022-08-22: 12.5 mg via ORAL
  Filled 2022-08-22: qty 1

## 2022-08-22 MED ORDER — DOXAZOSIN MESYLATE 4 MG PO TABS
2.0000 mg | ORAL_TABLET | Freq: Every day | ORAL | Status: DC
  Administered 2022-08-23 – 2022-08-26 (×4): 2 mg via ORAL
  Filled 2022-08-22 (×4): qty 1

## 2022-08-22 NOTE — Progress Notes (Addendum)
Advanced Heart Failure VAD Team Note  PCP-Cardiologist: Glori Bickers, MD   Subjective:    11/6: Admit w/ DL infection. CT with diffuse soft-tissue involvement but no overt abscess. Wound Cx growing few staph aureus. BCx NG. 11/8: s/p extensive debridement + wound vac. Wound Cx from OR NG. 11/14: OR for debridement + wound vac exchange 11/15: Bleeding at Wound vac site. Vessels stitched and vac changed.    Still with pain at wound vac site. No further leakage alarms. Afebrile on IV abx.   Remains on heparin/warfarin  INR 1.2  MAPs elevated   LVAD INTERROGATION:  HeartMate III LVAD:   Flow 5.1  liters/min, speed 6200, power 5 , PI 2.3    Objective:    Vital Signs:   Temp:  [97.6 F (36.4 C)-98.9 F (37.2 C)] 97.6 F (36.4 C) (11/18 1138) Pulse Rate:  [68-156] 68 (11/18 0718) Resp:  [17-24] 17 (11/18 0718) BP: (96-124)/(75-99) 124/99 (11/18 0718) SpO2:  [85 %-100 %] 98 % (11/18 0718) Weight:  [131.1 kg] 131.1 kg (11/18 0551) Last BM Date : 08/21/22 Mean arterial Pressure- 90-110  Intake/Output:   Intake/Output Summary (Last 24 hours) at 08/22/2022 1226 Last data filed at 08/21/2022 2125 Gross per 24 hour  Intake --  Output 200 ml  Net -200 ml      Physical Exam   General:  NAD.  HEENT: normal  Neck: supple. JVP not elevated.  Carotids 2+ bilat; no bruits. No lymphadenopathy or thryomegaly appreciated. Cor: LVAD hum.  Lungs: Clear. Abdomen: obese soft, + tender, non-distended. No hepatosplenomegaly. No bruits or masses. Good bowel sounds. Wound vac site ok  Extremities: no cyanosis, clubbing, rash. Warm no edema  Neuro: alert & oriented x 3. No focal deficits. Moves all 4 without problem     Telemetry   SR 60-70s Personally reviewed  Labs   Basic Metabolic Panel: Recent Labs  Lab 08/18/22 0340 08/19/22 1200 08/20/22 0540 08/21/22 0545 08/22/22 0555  NA 138 138 134* 138 140  K 4.2 4.4 4.4 4.6 4.8  CL 104 105 102 104 105  CO2 '27 25 25  27 25  '$ GLUCOSE 107* 128* 101* 104* 107*  BUN 24* 31* 35* 40* 40*  CREATININE 1.42* 1.50* 1.53* 1.50* 1.62*  CALCIUM 10.4* 10.1 9.6 9.9 10.0     Liver Function Tests: No results for input(s): "AST", "ALT", "ALKPHOS", "BILITOT", "PROT", "ALBUMIN" in the last 168 hours.  No results for input(s): "LIPASE", "AMYLASE" in the last 168 hours. No results for input(s): "AMMONIA" in the last 168 hours.  CBC: Recent Labs  Lab 08/18/22 0340 08/19/22 0540 08/20/22 0540 08/21/22 0545 08/22/22 0555  WBC 7.6 10.7* 10.3 9.8 9.1  HGB 13.1 13.1 12.7* 12.1* 12.4*  HCT 38.9* 39.3 37.9* 37.7* 39.1  MCV 89.4 89.9 91.1 92.2 92.4  PLT 300 322 295 284 293     INR: Recent Labs  Lab 08/18/22 0340 08/19/22 0540 08/20/22 0540 08/21/22 0545 08/22/22 0555  INR 1.1 1.0 1.0 1.1 1.2     Other results:   Imaging   No results found.   Medications:     Scheduled Medications:  allopurinol  100 mg Oral Daily   amiodarone  200 mg Oral Daily   amLODipine  10 mg Oral Daily   Chlorhexidine Gluconate Cloth  6 each Topical Daily   colchicine  0.6 mg Oral Daily   doxazosin  1 mg Oral Daily   gabapentin  300 mg Oral TID   insulin aspart  0-9  Units Subcutaneous TID WC   isosorbide-hydrALAZINE  2 tablet Oral TID   metFORMIN  1,000 mg Oral BID WC   multivitamin with minerals  1 tablet Oral Daily   nutrition supplement (JUVEN)  1 packet Oral BID BM   pantoprazole  40 mg Oral Daily   sacubitril-valsartan  1 tablet Oral BID   sertraline  150 mg Oral Daily   sodium chloride flush  10-40 mL Intracatheter Q12H   spironolactone  25 mg Oral Daily   traZODone  100 mg Oral QHS   Warfarin - Pharmacist Dosing Inpatient   Does not apply q1600   zinc sulfate  220 mg Oral Daily    Infusions:  sodium chloride 10 mL/hr at 08/21/22 0973    ceFAZolin (ANCEF) IV 2 g (08/22/22 0631)   heparin 500 Units/hr (08/21/22 1451)    PRN Medications: sodium chloride, acetaminophen, alum & mag hydroxide-simeth,  hydrALAZINE, HYDROcodone-acetaminophen, morphine injection, naloxone, mouth rinse, oxyCODONE, sodium chloride flush, traMADol   Patient Profile   46 y.o. male with a history of  poorly controlled HTN, R renal cell carcinoma s/p nephrectomy, CKD IIIa, DM2, OSA, gout, morbid obesity and systolic HF due to NICM. 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    Assessment/Plan:    1. 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 Bedside wound vac change and vessel cauterization 11/15 2/2 hematoma.  - will need to discharge with IV cefazolin through PICC line (6 weeks from 11/14) then transition to cefadroxil 500 mg PO bid for chronic suppression per ID. VAC will be monitored through VAD clinic. Dr. Darcey Nora following. - Home wound vac in room and [facility] arranged - Site stable. Afebrile - Plan Wound Vac change at bedside on Monday  2.  Chronic systolic CHF  - Nonischemic CM, end-stage  - EF 2012; 20-25%  - Echo  8/22: EF < 20%, severe LV dilation, moderate RV dysfunction.   - RHC (8/22): well compensated hemodynamics.  - s/p S-ICD  - S/p HM3 LVAD 10/17 (DT)  - Volume status ok. Hold diuretics.  - Continue Bidil 2 tabs tid  - Continue spironolactone 25 mg daily  - Continue entresto 97/103 bid  - Resume ozempic after discharge.  - Renal function stable.   3. VAD  - s/p HM-3 placement 07/21/21  - VAD interrogated personally. Parameters stable. - LDH stable - Continue heparin and coumadin. INR 1.2 today   4 Atrial fibrillation: Paroxysmal  - Had AF with RVR 7/22 s/p DC-CV x 2  - Seen by EP. Not a candidate for ablation  due to body habitus  (could consider AVN ablation and CRT, if needed)  - Had post-op AF with RVR post VAD  - 10/23 S/P DC-CV with restoration of NSR.  - IN SR . Continue amio 200 daily  - On coumadin as above. Continue heparin until INR closer to therapeutic range.   5. HTN  - MAPs 90-110.  - Will increase doxazosin - If MAP > 100 can give IV hydralazine prn   6. CKD stage 3B  - Has solitary kidney.   - Baseline creatinine is  1.5-1.8.  - Stable 1.62 today   7. OSA  - Continue CPAP    8. DM2  - On Jardiance.  - Hgb A1c 9.4. -> 5.9  - PCP following    9. Polyarticular gout  - quiescent    10. Depression/anxiety  - Continue Zoloft '150mg'$  daily    11. Morbid obesity  -Body mass index is 45.27 kg/m. - Holding GLP1RA for OR (last dose 11/2)  I reviewed the LVAD parameters from today, and compared the results to the patient's prior recorded data.  No programming changes were made.  The LVAD is functioning within specified parameters.  The patient performs LVAD self-test daily.  LVAD interrogation was negative for any significant power changes, alarms or PI events/speed drops.  LVAD equipment check completed and is in good working order.  Back-up equipment present.   LVAD education done on emergency procedures and precautions and reviewed exit site care.  Length of Stay: 12 Glori Bickers MD  12:26 PM  VAD Team --- VAD ISSUES ONLY--- Pager [phone] (7am - 7am)  Advanced Heart Failure Team  Pager [phone] (M-F; 7a - 5p)  Please contact [facility] for night-coverage after hours (5p -7a ) and weekends on [email or web address]

## 2022-08-22 NOTE — Progress Notes (Signed)
[place] for Heparin & Coumadin Indication:  VAD  Allergies  Allergen Reactions   Aspirin Shortness Of Breath, Itching and Rash     Burning sensation (Patient reports he tolerates other NSAIDS)    Bee Venom Hives and Swelling   Lisinopril Cough   Shrimp (Diagnostic) Rash    Patient Measurements: Height: '5\' 7"'$  (170.2 cm) Weight: 131.1 kg (289 lb 0.4 oz) (Scale A) IBW/kg (Calculated) : 66.1 Heparin Dosing Weight: 98 kg  Vital Signs: Temp: 97.6 F (36.4 C) (11/18 1138) Temp Source: Oral (11/18 1138) BP: 124/99 (11/18 0718) Pulse Rate: 68 (11/18 0718)  Labs: Recent Labs    08/20/22 0540 08/21/22 0545 08/22/22 0555  HGB 12.7* 12.1* 12.4*  HCT 37.9* 37.7* 39.1  PLT 295 284 293  LABPROT 13.1 13.7 14.8  INR 1.0 1.1 1.2  HEPARINUNFRC <0.10* <0.10* <0.10*  CREATININE 1.53* 1.50* 1.62*    Estimated Creatinine Clearance: 73.4 mL/min (A) (by C-G formula based on SCr of 1.62 mg/dL (H)).  Assessment: 46 yo male with a VAD HM3, presents to clinic w/ signs of  abdominal wall cellulitis and DL infection. + drainage from site. + low grade fever 99.8.  Patient is managed as an outpatient on Warfarin therapy. Pharmacy consulted to start a heparin infusion when INR < 2.  Heparin level remains undetectable, LDH and CBC stable, INR up slightly to 1.2.  PTA Warfarin - 8 mg daily, except 4 mg on Tues and Sat.  Goal of Therapy:  Heparin level < 0.3 INR 2-2.5 post debridement  Monitor platelets by anticoagulation protocol: Yes   Plan:  Continue IV heparin at low rate of 500 units/hr as ordered.  Warfarin 12.'5mg'$  PO x1 tonight - boosted dose to facilitate D/C Monday Daily PT/INR, heparin level, and CBC.  Arrie Senate, PharmD, BCPS, [facility] Clinical Pharmacist [phone] Please check AMION for all [place] numbers 08/22/2022

## 2022-08-22 NOTE — Progress Notes (Signed)
     [address]       [place] [address]             [phone]       No events Wound vac stable Complains of some pain, but tolerable  Bedside W[place] change on Monday [place]

## 2022-08-23 DIAGNOSIS — T827XXA Infection and inflammatory reaction due to other cardiac and vascular devices, implants and grafts, initial encounter: Secondary | ICD-10-CM | POA: Diagnosis not present

## 2022-08-23 LAB — AEROBIC/ANAEROBIC CULTURE W GRAM STAIN (SURGICAL/DEEP WOUND): Culture: NO GROWTH

## 2022-08-23 LAB — BASIC METABOLIC PANEL
Anion gap: 10 (ref 5–15)
BUN: 39 mg/dL — ABNORMAL HIGH (ref 6–20)
CO2: 26 mmol/L (ref 22–32)
Calcium: 10.1 mg/dL (ref 8.9–10.3)
Chloride: 101 mmol/L (ref 98–111)
Creatinine, Ser: 1.51 mg/dL — ABNORMAL HIGH (ref 0.61–1.24)
GFR, Estimated: 57 mL/min — ABNORMAL LOW (ref >60)
Glucose, Bld: 102 mg/dL — ABNORMAL HIGH (ref 70–99)
Potassium: 4.7 mmol/L (ref 3.5–5.1)
Sodium: 137 mmol/L (ref 135–145)

## 2022-08-23 LAB — CBC
HCT: 38.1 % — ABNORMAL LOW (ref 39.0–52.0)
Hemoglobin: 12.4 g/dL — ABNORMAL LOW (ref 13.0–17.0)
MCH: 29.7 pg (ref 26.0–34.0)
MCHC: 32.5 g/dL (ref 30.0–36.0)
MCV: 91.1 fL (ref 80.0–100.0)
Platelets: 286 10*3/uL (ref 150–400)
RBC: 4.18 MIL/uL — ABNORMAL LOW (ref 4.22–5.81)
RDW: 14.1 % (ref 11.5–15.5)
WBC: 8.9 10*3/uL (ref 4.0–10.5)
nRBC: 0 % (ref 0.0–0.2)

## 2022-08-23 LAB — GLUCOSE, CAPILLARY
Glucose-Capillary: 109 mg/dL — ABNORMAL HIGH (ref 70–99)
Glucose-Capillary: 91 mg/dL (ref 70–99)
Glucose-Capillary: 123 mg/dL — ABNORMAL HIGH (ref 70–99)
Glucose-Capillary: 167 mg/dL — ABNORMAL HIGH (ref 70–99)

## 2022-08-23 LAB — LACTATE DEHYDROGENASE: LDH: 157 U/L (ref 98–192)

## 2022-08-23 LAB — PROTIME-INR
INR: 1.4 — ABNORMAL HIGH (ref 0.8–1.2)
Prothrombin Time: 17.1 seconds — ABNORMAL HIGH (ref 11.4–15.2)

## 2022-08-23 LAB — HEPARIN LEVEL (UNFRACTIONATED): Heparin Unfractionated: <0.1 IU/mL — ABNORMAL LOW (ref 0.30–0.70)

## 2022-08-23 MED ORDER — WARFARIN SODIUM 5 MG PO TABS
10.0000 mg | ORAL_TABLET | Freq: Once | ORAL | Status: AC
  Administered 2022-08-23: 10 mg via ORAL
  Filled 2022-08-23: qty 2

## 2022-08-23 NOTE — Progress Notes (Signed)
[place] for Heparin & Coumadin Indication:  VAD  Allergies  Allergen Reactions   Aspirin Shortness Of Breath, Itching and Rash     Burning sensation (Patient reports he tolerates other NSAIDS)    Bee Venom Hives and Swelling   Lisinopril Cough   Shrimp (Diagnostic) Rash    Patient Measurements: Height: '5\' 7"'$  (170.2 cm) Weight: 130 kg (286 lb 9.6 oz) IBW/kg (Calculated) : 66.1 Heparin Dosing Weight: 98 kg  Vital Signs: Temp: 97.8 F (36.6 C) (11/19 0354) Temp Source: Oral (11/19 0354) BP: 119/86 (11/19 0823) Pulse Rate: 135 (11/19 0823)  Labs: Recent Labs    08/21/22 0545 08/22/22 0555 08/23/22 0544  HGB 12.1* 12.4* 12.4*  HCT 37.7* 39.1 38.1*  PLT 284 293 286  LABPROT 13.7 14.8 17.1*  INR 1.1 1.2 1.4*  HEPARINUNFRC <0.10* <0.10* <0.10*  CREATININE 1.50* 1.62* 1.51*    Estimated Creatinine Clearance: 78.4 mL/min (A) (by C-G formula based on SCr of 1.51 mg/dL (H)).  Assessment: 46 yo male with a VAD HM3, presents to clinic w/ signs of  abdominal wall cellulitis and DL infection. + drainage from site. + low grade fever 99.8.  Patient is managed as an outpatient on Warfarin therapy. Pharmacy consulted to start a heparin infusion when INR < 2.  Heparin level remains undetectable, LDH and CBC stable, INR up slightly to 1.4.  PTA Warfarin - 8 mg daily, except 4 mg on Tues and Sat.  Goal of Therapy:  Heparin level < 0.3 INR 2-2.5 post debridement  Monitor platelets by anticoagulation protocol: Yes   Plan:  Continue IV heparin at low rate of 500 units/hr as ordered Warfarin '10mg'$  PO x1 tonight - boosted dose to facilitate D/C Monday Daily PT/INR, heparin level, and CBC.  Arrie Senate, PharmD, BCPS, [facility] Clinical Pharmacist [phone] Please check AMION for all [place] numbers 08/23/2022

## 2022-08-23 NOTE — Progress Notes (Signed)
Advanced Heart Failure VAD Team Note  PCP-Cardiologist: Glori Bickers, MD   Subjective:    11/6: Admit w/ DL infection. CT with diffuse soft-tissue involvement but no overt abscess. Wound Cx growing few staph aureus. BCx NG. 11/8: s/p extensive debridement + wound vac. Wound Cx from OR NG. 11/14: OR for debridement + wound vac exchange 11/15: Bleeding at Wound vac site. Vessels stitched and vac changed.    Still with pain at wound vac site. No further leakage alarms. Afebrile on IV abx.   Denies CP or SOB. Bowels regular. Volume status ok. Weight down 3 pounds.    Remains on heparin/warfarin  INR 1.2 -> 1.4  MAPs improved with increase in doxazosin    LVAD INTERROGATION:  HeartMate III LVAD:   Flow 5.4  liters/min, speed 6200, power 5.0 , PI 2.0   Objective:    Vital Signs:   Temp:  [97.8 F (36.6 C)-98.4 F (36.9 C)] 97.8 F (36.6 C) (11/19 0354) Pulse Rate:  [73-174] 148 (11/19 1203) Resp:  [15-28] 20 (11/19 1203) BP: (95-133)/(70-87) 133/70 (11/19 1203) SpO2:  [93 %-96 %] 96 % (11/19 0823) Weight:  [130 kg] 130 kg (11/19 0354) Last BM Date : 08/21/22 Mean arterial Pressure-80s  Intake/Output:   Intake/Output Summary (Last 24 hours) at 08/23/2022 1221 Last data filed at 08/23/2022 1203 Gross per 24 hour  Intake 221.37 ml  Output 3000 ml  Net -2778.63 ml      Physical Exam   General:  NAD.  HEENT: normal  Neck: supple. JVP not elevated.  Carotids 2+ bilat; no bruits. No lymphadenopathy or thryomegaly appreciated. Cor: LVAD hum.  Lungs: Clear. Abdomen: obese soft, nontender, non-distended. No hepatosplenomegaly. No bruits or masses. Good bowel sounds. Wound vac site ok  Extremities: no cyanosis, clubbing, rash. Warm no edema  Neuro: alert & oriented x 3. No focal deficits. Moves all 4 without problem      Telemetry   SR 60-70s Personally reviewed  Labs   Basic Metabolic Panel: Recent Labs  Lab 08/19/22 1200 08/20/22 0540  08/21/22 0545 08/22/22 0555 08/23/22 0544  NA 138 134* 138 140 137  K 4.4 4.4 4.6 4.8 4.7  CL 105 102 104 105 101  CO2 '25 25 27 25 26  '$ GLUCOSE 128* 101* 104* 107* 102*  BUN 31* 35* 40* 40* 39*  CREATININE 1.50* 1.53* 1.50* 1.62* 1.51*  CALCIUM 10.1 9.6 9.9 10.0 10.1     Liver Function Tests: No results for input(s): "AST", "ALT", "ALKPHOS", "BILITOT", "PROT", "ALBUMIN" in the last 168 hours.  No results for input(s): "LIPASE", "AMYLASE" in the last 168 hours. No results for input(s): "AMMONIA" in the last 168 hours.  CBC: Recent Labs  Lab 08/19/22 0540 08/20/22 0540 08/21/22 0545 08/22/22 0555 08/23/22 0544  WBC 10.7* 10.3 9.8 9.1 8.9  HGB 13.1 12.7* 12.1* 12.4* 12.4*  HCT 39.3 37.9* 37.7* 39.1 38.1*  MCV 89.9 91.1 92.2 92.4 91.1  PLT 322 295 284 293 286     INR: Recent Labs  Lab 08/19/22 0540 08/20/22 0540 08/21/22 0545 08/22/22 0555 08/23/22 0544  INR 1.0 1.0 1.1 1.2 1.4*     Other results:   Imaging   No results found.   Medications:     Scheduled Medications:  allopurinol  100 mg Oral Daily   amiodarone  200 mg Oral Daily   amLODipine  10 mg Oral Daily   Chlorhexidine Gluconate Cloth  6 each Topical Daily   colchicine  0.6 mg Oral Daily  doxazosin  2 mg Oral Daily   gabapentin  300 mg Oral TID   insulin aspart  0-9 Units Subcutaneous TID WC   isosorbide-hydrALAZINE  2 tablet Oral TID   metFORMIN  1,000 mg Oral BID WC   multivitamin with minerals  1 tablet Oral Daily   nutrition supplement (JUVEN)  1 packet Oral BID BM   pantoprazole  40 mg Oral Daily   sacubitril-valsartan  1 tablet Oral BID   sertraline  150 mg Oral Daily   sodium chloride flush  10-40 mL Intracatheter Q12H   spironolactone  25 mg Oral Daily   traZODone  100 mg Oral QHS   warfarin  10 mg Oral ONCE-1600   Warfarin - Pharmacist Dosing Inpatient   Does not apply q1600   zinc sulfate  220 mg Oral Daily    Infusions:  sodium chloride 10 mL/hr at 08/21/22 5956     ceFAZolin (ANCEF) IV 2 g (08/23/22 0553)   heparin 500 Units/hr (08/23/22 0354)    PRN Medications: sodium chloride, acetaminophen, alum & mag hydroxide-simeth, hydrALAZINE, HYDROcodone-acetaminophen, morphine injection, naloxone, mouth rinse, oxyCODONE, sodium chloride flush, traMADol   Patient Profile   46 y.o. male with a history of  poorly controlled HTN, R renal cell carcinoma s/p nephrectomy, CKD IIIa, DM2, OSA, gout, morbid obesity and systolic HF due to NICM. 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    Assessment/Plan:    1. 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 Bedside wound vac change and vessel cauterization 11/15 2/2 hematoma.  - will need to discharge with IV cefazolin through PICC line (6 weeks from 11/14) then transition to cefadroxil 500 mg PO bid for chronic suppression per ID. VAC will be monitored through VAD clinic. Dr. Darcey Nora following. - Home wound vac in room and [facility] arranged - Site stable. Afebrile - Plan Wound Vac change at bedside tomorrow am  2.  Chronic systolic CHF  - Nonischemic CM, end-stage  - EF 2012; 20-25%  - Echo  8/22: EF < 20%, severe LV dilation, moderate RV dysfunction.   - RHC (8/22): well compensated hemodynamics.  - s/p S-ICD  - S/p HM3 LVAD 10/17 (DT)  - Volume status ok. Continue to hold diuretics - Continue Bidil 2 tabs tid  - Continue spironolactone 25 mg daily  - Continue entresto 97/103 bid  - Resume ozempic after discharge.  - Renal function stable  3. VAD  - s/p HM-3 placement 07/21/21  - VAD interrogated personally. Parameters stable. - LDH  stable - Continue heparin and coumadin. INR 1.2 -> 1.4 today. Home when INR 1.7 or greater Discussed dosing with PharmD personally.   4 Atrial fibrillation: Paroxysmal  - Had AF with RVR 7/22 s/p DC-CV x 2  - Seen by EP. Not a candidate for ablation due to body habitus  (could consider AVN ablation and CRT, if needed)  - Had post-op AF with RVR post VAD  - 10/23 S/P DC-CV with restoration of NSR.  - IN SR . Continue amio 200 daily  - On coumadin as above. Continue heparin until INR closer to therapeutic  range.   5. HTN  - MAPs improved on increased doxazosin dose  - If MAP > 100 can give IV hydralazine prn   6. CKD stage 3B  - Has solitary kidney.   - Baseline creatinine is 1.5-1.8.  - Stable 1.51 today   7. OSA  - Continue CPAP    8. DM2  - On Jardiance.  - Hgb A1c 9.4. -> 5.9  - PCP following    9. Polyarticular gout  - quiescent    10. Depression/anxiety  - Continue Zoloft '150mg'$  daily    11. Morbid obesity  -Body mass index is 44.89 kg/m. - Holding GLP1RA for OR (last dose 11/2)  I reviewed the LVAD parameters from today, and compared the results to the patient's prior recorded data.  No programming changes were made.  The LVAD is functioning within specified parameters.  The patient performs LVAD self-test daily.  LVAD interrogation was negative for any significant power changes, alarms or PI events/speed drops.  LVAD equipment check completed and is in good working order.  Back-up equipment present.   LVAD education done on emergency procedures and precautions and reviewed exit site care.  Length of Stay: 13 Glori Bickers MD  12:21 PM  VAD Team --- VAD ISSUES ONLY--- Pager [phone] (7am - 7am)  Advanced Heart Failure Team  Pager [phone] (M-F; 7a - 5p)  Please contact [facility] for night-coverage after hours (5p -7a ) and weekends on [email or web address]

## 2022-08-23 NOTE — Progress Notes (Signed)
Pt refusing to wear cpap, Pt states he wears cpap at home but does not want to wear ours due to the mask being uncomfortable.

## 2022-08-23 NOTE — Progress Notes (Signed)
Patient refused CPAP use at this time.

## 2022-08-24 DIAGNOSIS — T827XXA Infection and inflammatory reaction due to other cardiac and vascular devices, implants and grafts, initial encounter: Secondary | ICD-10-CM | POA: Diagnosis not present

## 2022-08-24 LAB — LACTATE DEHYDROGENASE: LDH: 128 U/L (ref 98–192)

## 2022-08-24 LAB — CBC
HCT: 37.6 % — ABNORMAL LOW (ref 39.0–52.0)
Hemoglobin: 12 g/dL — ABNORMAL LOW (ref 13.0–17.0)
MCH: 29.6 pg (ref 26.0–34.0)
MCHC: 31.9 g/dL (ref 30.0–36.0)
MCV: 92.6 fL (ref 80.0–100.0)
Platelets: 278 10*3/uL (ref 150–400)
RBC: 4.06 MIL/uL — ABNORMAL LOW (ref 4.22–5.81)
RDW: 14.3 % (ref 11.5–15.5)
WBC: 8.3 10*3/uL (ref 4.0–10.5)
nRBC: 0 % (ref 0.0–0.2)

## 2022-08-24 LAB — BASIC METABOLIC PANEL
Anion gap: 9 (ref 5–15)
BUN: 38 mg/dL — ABNORMAL HIGH (ref 6–20)
CO2: 25 mmol/L (ref 22–32)
Calcium: 9.7 mg/dL (ref 8.9–10.3)
Chloride: 105 mmol/L (ref 98–111)
Creatinine, Ser: 1.52 mg/dL — ABNORMAL HIGH (ref 0.61–1.24)
GFR, Estimated: 57 mL/min — ABNORMAL LOW (ref >60)
Glucose, Bld: 112 mg/dL — ABNORMAL HIGH (ref 70–99)
Potassium: 4.6 mmol/L (ref 3.5–5.1)
Sodium: 139 mmol/L (ref 135–145)

## 2022-08-24 LAB — HEPARIN LEVEL (UNFRACTIONATED): Heparin Unfractionated: <0.1 IU/mL — ABNORMAL LOW (ref 0.30–0.70)

## 2022-08-24 LAB — PROTIME-INR
INR: 1.5 — ABNORMAL HIGH (ref 0.8–1.2)
Prothrombin Time: 18.3 seconds — ABNORMAL HIGH (ref 11.4–15.2)

## 2022-08-24 LAB — GLUCOSE, CAPILLARY
Glucose-Capillary: 105 mg/dL — ABNORMAL HIGH (ref 70–99)
Glucose-Capillary: 124 mg/dL — ABNORMAL HIGH (ref 70–99)
Glucose-Capillary: 117 mg/dL — ABNORMAL HIGH (ref 70–99)
Glucose-Capillary: 128 mg/dL — ABNORMAL HIGH (ref 70–99)

## 2022-08-24 MED ORDER — GABAPENTIN 400 MG PO CAPS
400.0000 mg | ORAL_CAPSULE | Freq: Three times a day (TID) | ORAL | Status: DC
  Administered 2022-08-24 – 2022-08-26 (×5): 400 mg via ORAL
  Filled 2022-08-24 (×5): qty 1

## 2022-08-24 MED ORDER — WARFARIN SODIUM 7.5 MG PO TABS
12.5000 mg | ORAL_TABLET | Freq: Once | ORAL | Status: AC
  Administered 2022-08-24: 12.5 mg via ORAL
  Filled 2022-08-24: qty 1

## 2022-08-24 NOTE — Plan of Care (Signed)
  Problem: Education: Goal: Patient will understand all VAD equipment and how it functions Outcome: Progressing Goal: Patient will be able to verbalize current INR target range and antiplatelet therapy for discharge home Outcome: Progressing   Problem: Cardiac: Goal: LVAD will function as expected and patient will experience no clinical alarms Outcome: Progressing   Problem: Education: Goal: Knowledge of General Education information will improve Description: Including pain rating scale, medication(s)/side effects and non-pharmacologic comfort measures Outcome: Progressing   Problem: Health Behavior/Discharge Planning: Goal: Ability to manage health-related needs will improve Outcome: Progressing   Problem: Clinical Measurements: Goal: Ability to maintain clinical measurements within normal limits will improve Outcome: Progressing Goal: Will remain free from infection Outcome: Progressing Goal: Diagnostic test results will improve Outcome: Progressing Goal: Respiratory complications will improve Outcome: Progressing Goal: Cardiovascular complication will be avoided Outcome: Progressing   Problem: Activity: Goal: Risk for activity intolerance will decrease Outcome: Progressing   Problem: Nutrition: Goal: Adequate nutrition will be maintained Outcome: Progressing   Problem: Coping: Goal: Level of anxiety will decrease Outcome: Progressing   Problem: Elimination: Goal: Will not experience complications related to bowel motility Outcome: Progressing Goal: Will not experience complications related to urinary retention Outcome: Progressing   Problem: Pain Managment: Goal: General experience of comfort will improve Outcome: Progressing   Problem: Safety: Goal: Ability to remain free from injury will improve Outcome: Progressing   Problem: Skin Integrity: Goal: Risk for impaired skin integrity will decrease Outcome: Progressing   Problem: Education: Goal: Ability  to describe self-care measures that may prevent or decrease complications (Diabetes Survival Skills Education) will improve Outcome: Progressing Goal: Individualized Educational Video(s) Outcome: Progressing   Problem: Coping: Goal: Ability to adjust to condition or change in health will improve Outcome: Progressing   Problem: Fluid Volume: Goal: Ability to maintain a balanced intake and output will improve Outcome: Progressing   Problem: Health Behavior/Discharge Planning: Goal: Ability to identify and utilize available resources and services will improve Outcome: Progressing Goal: Ability to manage health-related needs will improve Outcome: Progressing   Problem: Metabolic: Goal: Ability to maintain appropriate glucose levels will improve Outcome: Progressing   Problem: Nutritional: Goal: Maintenance of adequate nutrition will improve Outcome: Progressing Goal: Progress toward achieving an optimal weight will improve Outcome: Progressing   Problem: Skin Integrity: Goal: Risk for impaired skin integrity will decrease Outcome: Progressing   Problem: Tissue Perfusion: Goal: Adequacy of tissue perfusion will improve Outcome: Progressing

## 2022-08-24 NOTE — Progress Notes (Signed)
[place] for Heparin & Coumadin Indication:  VAD  Allergies  Allergen Reactions   Aspirin Shortness Of Breath, Itching and Rash     Burning sensation (Patient reports he tolerates other NSAIDS)    Bee Venom Hives and Swelling   Lisinopril Cough   Shrimp (Diagnostic) Rash    Patient Measurements: Height: '5\' 7"'$  (170.2 cm) Weight: 130.1 kg (286 lb 13.1 oz) IBW/kg (Calculated) : 66.1 Heparin Dosing Weight: 98 kg  Vital Signs: Temp: 98.2 F (36.8 C) (11/20 0543) Temp Source: Oral (11/20 0543) BP: 112/99 (11/20 0543)  Labs: Recent Labs    08/22/22 0555 08/23/22 0544 08/24/22 0520  HGB 12.4* 12.4* 12.0*  HCT 39.1 38.1* 37.6*  PLT 293 286 278  LABPROT 14.8 17.1* 18.3*  INR 1.2 1.4* 1.5*  HEPARINUNFRC <0.10* <0.10* <0.10*  CREATININE 1.62* 1.51*  --     Estimated Creatinine Clearance: 78.4 mL/min (A) (by C-G formula based on SCr of 1.51 mg/dL (H)).  Assessment: 46 yo male with a VAD HM3, presents to clinic w/ signs of  abdominal wall cellulitis and DL infection. + drainage from site. + low grade fever 99.8.  Patient is managed as an outpatient on Warfarin therapy. Pharmacy consulted to start a heparin infusion when INR < 2.  Heparin level remains undetectable, LDH and CBC stable, INR up slightly to 1.5.  PTA Warfarin - 8 mg daily, except 4 mg on Tues and Sat.  Goal of Therapy:  Heparin level < 0.3 INR 2-2.5 post debridement  Monitor platelets by anticoagulation protocol: Yes   Plan:  Continue IV heparin at low rate of 500 units/hr as ordered Warfarin 12.5 mg PO x1 tonight Daily PT/INR, heparin level, and CBC.  Antonietta Jewel, PharmD, [place] Clinical Pharmacist  Phone: [phone] 08/24/2022 7:18 AM  Please check AMION for all [place] phone numbers After 10:00 PM, call [place] [phone]

## 2022-08-24 NOTE — Progress Notes (Signed)
Patient refused CPAP at this time. Uses nasal pillows at home and can't tolerate hospital mask.

## 2022-08-24 NOTE — Progress Notes (Deleted)
Advanced Heart Failure VAD Team Note  PCP-Cardiologist: Glori Bickers, MD   Subjective:    11/6: Admit w/ DL infection. CT with diffuse soft-tissue involvement but no overt abscess. Wound Cx growing few staph aureus. BCx NG. 11/8: s/p extensive debridement + wound vac. Wound Cx from OR NG. 11/14: OR for debridement + wound vac exchange 11/15: Bleeding at Wound vac site. Vessels stitched and vac changed.   Still with pain at wound vac site. Unable to get decent rest 2/2 pain. Afebrile on IV abx.   Denies CP or SOB. Volume stable. Weight stable.  Remains on heparin/warfarin  INR 1.2 -> 1.4 > 1.5  MAPs improved with increase in doxazosin   LVAD INTERROGATION:  HeartMate III LVAD:   Flow 5.4  liters/min, speed 6200, power 5.2 , PI 2.3, x1 PI event this am  Objective:    Vital Signs:   Temp:  [97.9 F (36.6 C)-98.2 F (36.8 C)] 98.2 F (36.8 C) (11/20 0543) Pulse Rate:  [135-148] 148 (11/19 1203) Resp:  [19-23] 22 (11/20 0543) BP: (93-133)/(70-99) 112/99 (11/20 0543) SpO2:  [96 %-98 %] 98 % (11/20 0543) Weight:  [130.1 kg] 130.1 kg (11/20 0543) Last BM Date : 08/21/22 Mean arterial Pressure-80s/90s  Intake/Output:   Intake/Output Summary (Last 24 hours) at 08/24/2022 0727 Last data filed at 08/24/2022 0540 Gross per 24 hour  Intake 828.41 ml  Output 3075 ml  Net -2246.59 ml     Physical Exam   General:  Well appearing. No resp difficulty HEENT: Normal Neck: supple. No JVP. Carotids 2+ bilat; no bruits. No lymphadenopathy or thyromegaly appreciated. Cor: Mechanical heart sounds with LVAD hum present. Lungs: Clear Abdomen: soft, nontender, nondistended. No hepatosplenomegaly. No bruits or masses. Good bowel sounds. Driveline: C/D/I; wound vac in place, good seal. Stat lock in place.  Extremities: no cyanosis, clubbing, rash, edema Neuro: alert & orientedx3, cranial nerves grossly intact. moves all 4 extremities w/o difficulty. Affect pleasant   Telemetry    SR 60s (Personally reviewed)    Labs   Basic Metabolic Panel: Recent Labs  Lab 08/19/22 1200 08/20/22 0540 08/21/22 0545 08/22/22 0555 08/23/22 0544  NA 138 134* 138 140 137  K 4.4 4.4 4.6 4.8 4.7  CL 105 102 104 105 101  CO2 '25 25 27 25 26  '$ GLUCOSE 128* 101* 104* 107* 102*  BUN 31* 35* 40* 40* 39*  CREATININE 1.50* 1.53* 1.50* 1.62* 1.51*  CALCIUM 10.1 9.6 9.9 10.0 10.1    Liver Function Tests: No results for input(s): "AST", "ALT", "ALKPHOS", "BILITOT", "PROT", "ALBUMIN" in the last 168 hours.  No results for input(s): "LIPASE", "AMYLASE" in the last 168 hours. No results for input(s): "AMMONIA" in the last 168 hours.  CBC: Recent Labs  Lab 08/20/22 0540 08/21/22 0545 08/22/22 0555 08/23/22 0544 08/24/22 0520  WBC 10.3 9.8 9.1 8.9 8.3  HGB 12.7* 12.1* 12.4* 12.4* 12.0*  HCT 37.9* 37.7* 39.1 38.1* 37.6*  MCV 91.1 92.2 92.4 91.1 92.6  PLT 295 284 293 286 278    INR: Recent Labs  Lab 08/20/22 0540 08/21/22 0545 08/22/22 0555 08/23/22 0544 08/24/22 0520  INR 1.0 1.1 1.2 1.4* 1.5*    Other results:   Imaging   No results found.   Medications:     Scheduled Medications:  allopurinol  100 mg Oral Daily   amiodarone  200 mg Oral Daily   amLODipine  10 mg Oral Daily   Chlorhexidine Gluconate Cloth  6 each Topical Daily   colchicine  0.6 mg Oral Daily   doxazosin  2 mg Oral Daily   gabapentin  300 mg Oral TID   insulin aspart  0-9 Units Subcutaneous TID WC   isosorbide-hydrALAZINE  2 tablet Oral TID   metFORMIN  1,000 mg Oral BID WC   multivitamin with minerals  1 tablet Oral Daily   nutrition supplement (JUVEN)  1 packet Oral BID BM   pantoprazole  40 mg Oral Daily   sacubitril-valsartan  1 tablet Oral BID   sertraline  150 mg Oral Daily   sodium chloride flush  10-40 mL Intracatheter Q12H   spironolactone  25 mg Oral Daily   traZODone  100 mg Oral QHS   Warfarin - Pharmacist Dosing Inpatient   Does not apply q1600   zinc sulfate  220  mg Oral Daily    Infusions:  sodium chloride 10 mL/hr at 08/21/22 4496    ceFAZolin (ANCEF) IV 2 g (08/24/22 0540)   heparin 500 Units/hr (08/23/22 2000)    PRN Medications: sodium chloride, acetaminophen, alum & mag hydroxide-simeth, hydrALAZINE, HYDROcodone-acetaminophen, morphine injection, naloxone, mouth rinse, oxyCODONE, sodium chloride flush, traMADol   Patient Profile   46 y.o. male with a history of  poorly controlled HTN, R renal cell carcinoma s/p nephrectomy, CKD IIIa, DM2, OSA, gout, morbid obesity and systolic HF due to NICM. 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    Assessment/Plan:    1. 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 Bedside wound vac change and vessel cauterization 11/15 2/2 hematoma.  - will need to discharge with IV cefazolin through PICC line (6 weeks from 11/14) then transition to cefadroxil 500 mg PO bid for chronic suppression per ID. VAC will be monitored through VAD clinic. Dr. Darcey Nora following. - Home wound vac in room and [facility] arranged - Site stable. Afebrile - Plan Wound Vac change at bedside later this morning  2.  Chronic systolic CHF  - Nonischemic CM, end-stage  - EF 2012; 20-25%  - Echo  8/22: EF < 20%, severe LV dilation, moderate RV dysfunction.   - RHC (8/22): well compensated hemodynamics.  - s/p S-ICD  - S/p HM3 LVAD 10/17 (DT)  - Volume status ok. Continue to hold diuretics - Continue Bidil 2 tabs tid  - Continue spironolactone 25 mg daily  - Continue entresto 97/103 bid  - Resume ozempic after discharge.  - Renal function stable, pending this  am  3. VAD  - s/p HM-3 placement 07/21/21  - VAD interrogated personally. Parameters stable. - LDH stable - Continue heparin and coumadin. INR 1.4 -> 1.5 today. Home when INR 1.7 or greater. Discussed dosing with PharmD personally.   4 Atrial fibrillation: Paroxysmal  - Had AF with RVR 7/22 s/p DC-CV x 2  - Seen by EP. Not a candidate for ablation due to body habitus  (could consider AVN ablation and CRT, if needed)  - Had post-op AF with RVR post VAD  - 10/23 S/P DC-CV with restoration of NSR.  - IN SR. Continue amio 200 daily  - On coumadin as above. Continue heparin until INR closer to  therapeutic range.   5. HTN  - MAPs improved on increased doxazosin dose  - If MAP > 100 can give IV hydralazine prn   6. CKD stage 3B  - Has solitary kidney.   - Baseline creatinine is 1.5-1.8.  - BMET pending today  7. OSA  - Continue CPAP    8. DM2  - On Jardiance.  - Hgb A1c 9.4. -> 5.9  - PCP following    9. Polyarticular gout  - quiescent    10. Depression/anxiety  - Continue Zoloft '150mg'$  daily    11. Morbid obesity  -Body mass index is 44.92 kg/m. - Holding GLP1RA for OR (last dose 11/2)  I reviewed the LVAD parameters from today, and compared the results to the patient's prior recorded data.  No programming changes were made.  The LVAD is functioning within specified parameters.  The patient performs LVAD self-test daily.  LVAD interrogation was negative for any significant power changes, alarms or PI events/speed drops.  LVAD equipment check completed and is in good working order.  Back-up equipment present.   LVAD education done on emergency procedures and precautions and reviewed exit site care.  Length of Stay: [place], AGACNP-BC  7:27 AM  VAD Team --- VAD ISSUES ONLY--- Pager [phone] (7am - 7am)  Advanced Heart Failure Team  Pager [phone] (M-F; 7a - 5p)  Please contact [facility] for night-coverage after hours (5p -7a ) and weekends on  [email or web address]

## 2022-08-24 NOTE — Progress Notes (Signed)
Mobility Specialist Progress Note    08/24/22 1535  Mobility  Activity Ambulated independently in hallway  Level of Assistance Independent after set-up  Assistive Device None  Distance Ambulated (ft) 1200 ft  Activity Response Tolerated well  Mobility Referral Yes  $Mobility charge 1 Mobility   Pre-Mobility: 72 HR Post-Mobility: 93 HR  Pt received in bed and agreeable. No complaints on walk. Returned to chair with call [place] in reach.    [name] Mobility Specialist  Please Psychologist, sport and exercise or Rehab Office at [phone]

## 2022-08-24 NOTE — Progress Notes (Signed)
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 Cyndia Bent under Destination Therapy criteria due to BMI and chronic kidney disease.    Pt lying in bed on my arrival. Reports pain at wound vac site. PRN pain medication helps with this.   Currently on Cefazolin 2g Q 8 hrs per ID. Will need to discharge with IV Cefazolin through PICC line (6 weeks from 11/14) then transition to Cefadroxil 500 mg PO BID for chronic suppression per ID.   Pt will need wound vac and home IV antibiotics at hospital discharge. Home wound vac at bedside. Home health arranged with [facility] and [facility].   Vital signs: Temp:  98.2 HR: 62 NSR Doppler:98 Auto cuff: 112/99 (105) O2 Sat: 92% on 2L [place] Wt: 298>300.9>300.4>300.9>290.4>286.4>285.7>287.3>288.5>289>286.6>286.8 lbs  LVAD interrogation reveals:  Speed: 6250 Flow: 5.3 Power:  5.2 w PI: 2.1 Hct: 38   Alarms:  none Events: rare   Fixed speed:  6200 Low speed limit: 5900   Drive Line: Existing VAC dressing removed by Dr. Darcey Nora. VAC dressing and sponge removed by Dr Prescott Gum using sterile technique. Site cleansed with CHG swab x 2 and allowed to dry. Suture removed wound bed. Blue wound vac sponge placed in wound bed. Skin around wound prepped with benzoin. Covered sponge with clear vac dressing. Attached to new suction canister. Wound suction set at -100 per Dr Prescott Gum. Good seal achieved. No leak or blockage alarms noted. Anchor correctly applied. Plan for wound vac change next week at bedside or in VAD clinic if discharged later this week.    Labs:  LDH trend: 130>147>128>126>149>147>186>137>149>141>157>128   INR trend: 1.4>1.2>1.1>1.0>1.1>1.0>1.0>1.1>1.2>1.4>1.5  WBC: 11.5>10.9>9.1>7.1>7.2>7.6>10.7>10.3>9.8>9.1>8.9>8.3  Anticoagulation Plan: -INR Goal: 2.0 - 2.5  -ASA Dose: none due to allergy      Blood Products: N/A    Device: - [employer] single ICD -Therapies: on per BS rep on 08/06/21     Infection:  08/10/22>>driveline cx -- FEW STAPHYLOCOCCUS AUREUS  08/10/22>>BC x 2 -- NO GROWTH x 5 DAYS; final 08/12/22>> driveline cx OR -- NGTD; final 08/18/22>> driveline cx OR)-- no growth 2 days; final pending  Drips:  Heparin 500 units/hr  Plan/Recommendations:  Call VAD Coordinator if any VAD equipment or drive line issues. Call VAD coordinator if any wound vac issues.  Bobbye Morton RN,BSN [place] Coordinator  Office: [phone]  24/7 Pager: [phone]

## 2022-08-24 NOTE — Progress Notes (Signed)
Advanced Heart Failure VAD Team Note  PCP-Cardiologist: Glori Bickers, MD   Subjective:    11/6: Admit w/ DL infection. CT with diffuse soft-tissue involvement but no overt abscess. Wound Cx growing few staph aureus. BCx NG. 11/8: s/p extensive debridement + wound vac. Wound Cx from OR NG. 11/14: OR for debridement + wound vac exchange 11/15: Bleeding at Wound vac site. Vessels stitched and vac changed.   Still with pain at wound vac site. Unable to get decent rest 2/2 pain. Afebrile on IV abx.   Denies CP or SOB. Volume stable. Weight stable.  Remains on heparin/warfarin  INR 1.2 -> 1.4 > 1.5  MAPs improved with increase in doxazosin   LVAD INTERROGATION:  HeartMate III LVAD:   Flow 5.4  liters/min, speed 6200, power 5.2 , PI 2.3, x1 PI event this am  Objective:    Vital Signs:   Temp:  [97.9 F (36.6 C)-98.2 F (36.8 C)] 98.2 F (36.8 C) (11/20 0543) Pulse Rate:  [148] 148 (11/19 1203) Resp:  [20-23] 22 (11/20 0543) BP: (93-133)/(70-99) 112/99 (11/20 0543) SpO2:  [98 %] 98 % (11/20 0543) Weight:  [130.1 kg] 130.1 kg (11/20 0543) Last BM Date : 08/21/22 Mean arterial Pressure-80s/90s  Intake/Output:   Intake/Output Summary (Last 24 hours) at 08/24/2022 0829 Last data filed at 08/24/2022 0540 Gross per 24 hour  Intake 828.41 ml  Output 2575 ml  Net -1746.59 ml      Physical Exam   General:  Well appearing. No resp difficulty HEENT: Normal Neck: supple. No JVP. Carotids 2+ bilat; no bruits. No lymphadenopathy or thyromegaly appreciated. Cor: Mechanical heart sounds with LVAD hum present. Lungs: Clear Abdomen: soft, nontender, nondistended. No hepatosplenomegaly. No bruits or masses. Good bowel sounds. Driveline: C/D/I; wound vac in place, good seal. Stat lock in place.  Extremities: no cyanosis, clubbing, rash, edema Neuro: alert & orientedx3, cranial nerves grossly intact. moves all 4 extremities w/o difficulty. Affect pleasant   Telemetry   SR  60s (Personally reviewed)    Labs   Basic Metabolic Panel: Recent Labs  Lab 08/19/22 1200 08/20/22 0540 08/21/22 0545 08/22/22 0555 08/23/22 0544  NA 138 134* 138 140 137  K 4.4 4.4 4.6 4.8 4.7  CL 105 102 104 105 101  CO2 '25 25 27 25 26  '$ GLUCOSE 128* 101* 104* 107* 102*  BUN 31* 35* 40* 40* 39*  CREATININE 1.50* 1.53* 1.50* 1.62* 1.51*  CALCIUM 10.1 9.6 9.9 10.0 10.1     Liver Function Tests: No results for input(s): "AST", "ALT", "ALKPHOS", "BILITOT", "PROT", "ALBUMIN" in the last 168 hours.  No results for input(s): "LIPASE", "AMYLASE" in the last 168 hours. No results for input(s): "AMMONIA" in the last 168 hours.  CBC: Recent Labs  Lab 08/20/22 0540 08/21/22 0545 08/22/22 0555 08/23/22 0544 08/24/22 0520  WBC 10.3 9.8 9.1 8.9 8.3  HGB 12.7* 12.1* 12.4* 12.4* 12.0*  HCT 37.9* 37.7* 39.1 38.1* 37.6*  MCV 91.1 92.2 92.4 91.1 92.6  PLT 295 284 293 286 278     INR: Recent Labs  Lab 08/20/22 0540 08/21/22 0545 08/22/22 0555 08/23/22 0544 08/24/22 0520  INR 1.0 1.1 1.2 1.4* 1.5*     Other results:   Imaging   No results found.   Medications:     Scheduled Medications:  allopurinol  100 mg Oral Daily   amiodarone  200 mg Oral Daily   amLODipine  10 mg Oral Daily   Chlorhexidine Gluconate Cloth  6 each Topical Daily  colchicine  0.6 mg Oral Daily   doxazosin  2 mg Oral Daily   gabapentin  300 mg Oral TID   insulin aspart  0-9 Units Subcutaneous TID WC   isosorbide-hydrALAZINE  2 tablet Oral TID   metFORMIN  1,000 mg Oral BID WC   multivitamin with minerals  1 tablet Oral Daily   nutrition supplement (JUVEN)  1 packet Oral BID BM   pantoprazole  40 mg Oral Daily   sacubitril-valsartan  1 tablet Oral BID   sertraline  150 mg Oral Daily   sodium chloride flush  10-40 mL Intracatheter Q12H   spironolactone  25 mg Oral Daily   traZODone  100 mg Oral QHS   Warfarin - Pharmacist Dosing Inpatient   Does not apply q1600   zinc sulfate  220  mg Oral Daily    Infusions:  sodium chloride 10 mL/hr at 08/21/22 0938    ceFAZolin (ANCEF) IV 2 g (08/24/22 0540)   heparin 500 Units/hr (08/23/22 2000)    PRN Medications: sodium chloride, acetaminophen, alum & mag hydroxide-simeth, hydrALAZINE, HYDROcodone-acetaminophen, morphine injection, naloxone, mouth rinse, oxyCODONE, sodium chloride flush, traMADol   Patient Profile   46 y.o. male with a history of  poorly controlled HTN, R renal cell carcinoma s/p nephrectomy, CKD IIIa, DM2, OSA, gout, morbid obesity and systolic HF due to NICM. 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    Assessment/Plan:    1. 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 Bedside wound vac change and vessel cauterization 11/15 2/2 hematoma.  - will need to discharge with IV cefazolin through PICC line (6 weeks from 11/14) then transition to cefadroxil 500 mg PO bid for chronic suppression per ID. VAC will be monitored through VAD clinic. Dr. Darcey Nora following. - Home wound vac in room and [facility] arranged - Site stable. Afebrile - Plan Wound Vac change at bedside later this morning  2.  Chronic systolic CHF  - Nonischemic CM, end-stage  - EF 2012; 20-25%  - Echo  8/22: EF < 20%, severe LV dilation, moderate RV dysfunction.   - RHC (8/22): well compensated hemodynamics.  - s/p S-ICD  - S/p HM3 LVAD 10/17 (DT)  - Volume status ok. Continue to hold diuretics - Continue Bidil 2 tabs tid  - Continue spironolactone 25 mg daily  - Continue entresto 97/103 bid  - Resume ozempic after discharge.  - Renal function stable, pending this  am  3. VAD  - s/p HM-3 placement 07/21/21  - VAD interrogated personally. Parameters stable. - LDH stable - Continue heparin and coumadin. INR 1.4 -> 1.5 today. Home when INR 1.7 or greater. Discussed dosing with PharmD personally.   4 Atrial fibrillation: Paroxysmal  - Had AF with RVR 7/22 s/p DC-CV x 2  - Seen by EP. Not a candidate for ablation due to body habitus  (could consider AVN ablation and CRT, if needed)  - Had post-op AF with RVR post VAD  - 10/23 S/P DC-CV with restoration of NSR.  - IN SR. Continue amio 200 daily  - On coumadin as above. Continue heparin until INR  closer to therapeutic range.   5. HTN  - MAPs improved on increased doxazosin dose  - If MAP > 100 can give IV hydralazine prn   6. CKD stage 3B  - Has solitary kidney.   - Baseline creatinine is 1.5-1.8.  - BMET pending today  7. OSA  - Continue CPAP    8. DM2  - On Jardiance.  - Hgb A1c 9.4. -> 5.9  - PCP following    9. Polyarticular gout  - quiescent    10. Depression/anxiety  - Continue Zoloft '150mg'$  daily    11. Morbid obesity  -Body mass index is 44.92 kg/m. - Holding GLP1RA for OR (last dose 11/2)  I reviewed the LVAD parameters from today, and compared the results to the patient's prior recorded data.  No programming changes were made.  The LVAD is functioning within specified parameters.  The patient performs LVAD self-test daily.  LVAD interrogation was negative for any significant power changes, alarms or PI events/speed drops.  LVAD equipment check completed and is in good working order.  Back-up equipment present.   LVAD education done on emergency procedures and precautions and reviewed exit site care.  Length of Stay: [place], AGACNP-BC  8:29 AM  VAD Team --- VAD ISSUES ONLY--- Pager [phone] (7am - 7am)  Advanced Heart Failure Team  Pager [phone] (M-F; 7a - 5p)  Please contact [facility] for night-coverage after hours (5p -7a ) and weekends on  [email or web address]  Patient seen and examined with the above-signed Advanced Practice Provider and/or Housestaff. I personally reviewed laboratory data, imaging studies and relevant notes. I independently examined the patient and formulated the important aspects of the plan. I have edited the note to reflect any of my changes or salient points. I have personally discussed the plan with the patient and/or family.  Still sore at wound vac site. On IV abx. Afebrile. On heparin/warfarin. INR 1.5 MAPs improved  General:  NAD.  HEENT: normal  Neck: supple. JVP not elevated.  Carotids 2+ bilat; no bruits. No lymphadenopathy or thryomegaly appreciated. Cor: LVAD hum.  Lungs: Clear. Abdomen: obese soft, nontender, non-distended. No hepatosplenomegaly. No bruits or masses. Good bowel sounds. Wound vac site ok  Extremities: no cyanosis, clubbing, rash. Warm no edema  Neuro: alert & oriented x 3. No focal deficits. Moves all 4 without problem   For wound VAC change today at bedside. Continue heparin/warfarin. Home when INR >= 1.7, MAPs improved with increased doxazosin,   VAD interrogated personally. Parameters stable.  Glori Bickers, MD  8:32 AM

## 2022-08-25 DIAGNOSIS — T827XXA Infection and inflammatory reaction due to other cardiac and vascular devices, implants and grafts, initial encounter: Secondary | ICD-10-CM | POA: Diagnosis not present

## 2022-08-25 LAB — CBC
HCT: 37.3 % — ABNORMAL LOW (ref 39.0–52.0)
Hemoglobin: 11.9 g/dL — ABNORMAL LOW (ref 13.0–17.0)
MCH: 29.3 pg (ref 26.0–34.0)
MCHC: 31.9 g/dL (ref 30.0–36.0)
MCV: 91.9 fL (ref 80.0–100.0)
Platelets: 274 10*3/uL (ref 150–400)
RBC: 4.06 MIL/uL — ABNORMAL LOW (ref 4.22–5.81)
RDW: 14.5 % (ref 11.5–15.5)
WBC: 7.7 10*3/uL (ref 4.0–10.5)
nRBC: 0 % (ref 0.0–0.2)

## 2022-08-25 LAB — BASIC METABOLIC PANEL
Anion gap: 9 (ref 5–15)
BUN: 41 mg/dL — ABNORMAL HIGH (ref 6–20)
CO2: 24 mmol/L (ref 22–32)
Calcium: 9.8 mg/dL (ref 8.9–10.3)
Chloride: 106 mmol/L (ref 98–111)
Creatinine, Ser: 1.5 mg/dL — ABNORMAL HIGH (ref 0.61–1.24)
GFR, Estimated: 57 mL/min — ABNORMAL LOW (ref >60)
Glucose, Bld: 117 mg/dL — ABNORMAL HIGH (ref 70–99)
Potassium: 4.3 mmol/L (ref 3.5–5.1)
Sodium: 139 mmol/L (ref 135–145)

## 2022-08-25 LAB — HEPARIN LEVEL (UNFRACTIONATED): Heparin Unfractionated: <0.1 IU/mL — ABNORMAL LOW (ref 0.30–0.70)

## 2022-08-25 LAB — GLUCOSE, CAPILLARY
Glucose-Capillary: 124 mg/dL — ABNORMAL HIGH (ref 70–99)
Glucose-Capillary: 100 mg/dL — ABNORMAL HIGH (ref 70–99)
Glucose-Capillary: 111 mg/dL — ABNORMAL HIGH (ref 70–99)
Glucose-Capillary: 113 mg/dL — ABNORMAL HIGH (ref 70–99)

## 2022-08-25 LAB — LACTATE DEHYDROGENASE: LDH: 133 U/L (ref 98–192)

## 2022-08-25 LAB — PROTIME-INR
INR: 1.6 — ABNORMAL HIGH (ref 0.8–1.2)
Prothrombin Time: 19 seconds — ABNORMAL HIGH (ref 11.4–15.2)

## 2022-08-25 MED ORDER — WARFARIN SODIUM 7.5 MG PO TABS
12.5000 mg | ORAL_TABLET | Freq: Once | ORAL | Status: AC
  Administered 2022-08-25: 12.5 mg via ORAL
  Filled 2022-08-25: qty 1

## 2022-08-25 NOTE — Progress Notes (Signed)
Advanced Heart Failure VAD Team Note  PCP-Cardiologist: Glori Bickers, MD   Subjective:    11/6: Admit w/ DL infection. CT with diffuse soft-tissue involvement but no overt abscess. Wound Cx growing few staph aureus. BCx NG. 11/8: s/p extensive debridement + wound vac. Wound Cx from OR NG. 11/14: OR for debridement + wound vac exchange 11/15: Bleeding at Wound vac site. Vessels stitched and vac changed.   Still with some pain at wound vac site. Remains on IV abx  Denies CP or SOB.   Remains on heparin/warfarin  INR 1.2 -> 1.4 > 1.5 > 1.6. No bleeding  MAPs improved with increase in doxazosin   LVAD INTERROGATION:  HeartMate III LVAD:   Flow 5.4  liters/min, speed 6200, power 5.2 , PI 2.3, x1 PI event this am  Objective:    Vital Signs:   Temp:  [97.8 F (36.6 C)-98.6 F (37 C)] 98 F (36.7 C) (11/21 0539) Pulse Rate:  [67-84] 84 (11/20 2000) Resp:  [20-24] 20 (11/21 0539) BP: (103-111)/(77-97) 103/77 (11/21 0539) SpO2:  [93 %-96 %] 93 % (11/21 0539) Last BM Date : 08/24/22 Mean arterial Pressure-80s/90s  Intake/Output:   Intake/Output Summary (Last 24 hours) at 08/25/2022 0814 Last data filed at 08/25/2022 0600 Gross per 24 hour  Intake 880.13 ml  Output 1500 ml  Net -619.87 ml      Physical Exam   General:  NAD.  HEENT: normal  Neck: supple. JVP not elevated.  Carotids 2+ bilat; no bruits. No lymphadenopathy or thryomegaly appreciated. Cor: LVAD hum.  Lungs: Clear. Abdomen: obese soft, nontender, non-distended. No hepatosplenomegaly. No bruits or masses. Good bowel sounds. + wound vac Extremities: no cyanosis, clubbing, rash. Warm no edema  Neuro: alert & oriented x 3. No focal deficits. Moves all 4 without problem    Telemetry   SR 70-80s (Personally reviewed)    Labs   Basic Metabolic Panel: Recent Labs  Lab 08/21/22 0545 08/22/22 0555 08/23/22 0544 08/24/22 0923 08/25/22 0550  NA 138 140 137 139 139  K 4.6 4.8 4.7 4.6 4.3  CL 104  105 101 105 106  CO2 '27 25 26 25 24  '$ GLUCOSE 104* 107* 102* 112* 117*  BUN 40* 40* 39* 38* 41*  CREATININE 1.50* 1.62* 1.51* 1.52* 1.50*  CALCIUM 9.9 10.0 10.1 9.7 9.8     Liver Function Tests: No results for input(s): "AST", "ALT", "ALKPHOS", "BILITOT", "PROT", "ALBUMIN" in the last 168 hours.  No results for input(s): "LIPASE", "AMYLASE" in the last 168 hours. No results for input(s): "AMMONIA" in the last 168 hours.  CBC: Recent Labs  Lab 08/21/22 0545 08/22/22 0555 08/23/22 0544 08/24/22 0520 08/25/22 0550  WBC 9.8 9.1 8.9 8.3 7.7  HGB 12.1* 12.4* 12.4* 12.0* 11.9*  HCT 37.7* 39.1 38.1* 37.6* 37.3*  MCV 92.2 92.4 91.1 92.6 91.9  PLT 284 293 286 278 274     INR: Recent Labs  Lab 08/21/22 0545 08/22/22 0555 08/23/22 0544 08/24/22 0520 08/25/22 0550  INR 1.1 1.2 1.4* 1.5* 1.6*     Other results:   Imaging   No results found.   Medications:     Scheduled Medications:  allopurinol  100 mg Oral Daily   amiodarone  200 mg Oral Daily   amLODipine  10 mg Oral Daily   Chlorhexidine Gluconate Cloth  6 each Topical Daily   colchicine  0.6 mg Oral Daily   doxazosin  2 mg Oral Daily   gabapentin  400 mg Oral TID  insulin aspart  0-9 Units Subcutaneous TID WC   isosorbide-hydrALAZINE  2 tablet Oral TID   metFORMIN  1,000 mg Oral BID WC   multivitamin with minerals  1 tablet Oral Daily   nutrition supplement (JUVEN)  1 packet Oral BID BM   pantoprazole  40 mg Oral Daily   sacubitril-valsartan  1 tablet Oral BID   sertraline  150 mg Oral Daily   sodium chloride flush  10-40 mL Intracatheter Q12H   spironolactone  25 mg Oral Daily   traZODone  100 mg Oral QHS   Warfarin - Pharmacist Dosing Inpatient   Does not apply q1600   zinc sulfate  220 mg Oral Daily    Infusions:  sodium chloride 10 mL/hr at 08/21/22 3086    ceFAZolin (ANCEF) IV 2 g (08/25/22 0612)   heparin 500 Units/hr (08/25/22 0023)    PRN Medications: sodium chloride, acetaminophen,  alum & mag hydroxide-simeth, hydrALAZINE, HYDROcodone-acetaminophen, morphine injection, naloxone, mouth rinse, oxyCODONE, sodium chloride flush, traMADol   Patient Profile   46 y.o. male with a history of  poorly controlled HTN, R renal cell carcinoma s/p nephrectomy, CKD IIIa, DM2, OSA, gout, morbid obesity and systolic HF due to NICM. 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    Assessment/Plan:    1. 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 Bedside wound vac change and vessel cauterization 11/15 2/2 hematoma.  - will need to discharge with IV cefazolin through PICC line (6 weeks from 11/14) then transition to cefadroxil 500 mg PO bid for chronic suppression per ID. VAC will be monitored through VAD clinic. Dr. Darcey Nora following. - Home wound vac in room and [facility] arranged - Site stable. Afebrile - s/p Wound Vac change yesterday at bedside  2.  Chronic systolic CHF  - Nonischemic CM, end-stage  - EF 2012; 20-25%  - Echo  8/22: EF < 20%, severe LV dilation, moderate RV dysfunction.   - RHC (8/22): well compensated hemodynamics.  - s/p S-ICD  - S/p HM3 LVAD 10/17 (DT)  - Volume status ok. Continue to hold diuretics - Continue Bidil 2 tabs tid  - Continue spironolactone 25 mg daily  - Continue entresto 97/103 bid  - Resume ozempic after discharge.   3. VAD  - s/p HM-3 placement 07/21/21  - VAD interrogated personally. Parameters stable. - LDH ok - Continue heparin and coumadin. INR 1.4 -> 1.5 > 1.6 today. Home when INR 1.7 or greater. Will check again this afternoon.    4 Atrial fibrillation: Paroxysmal   - Had AF with RVR 7/22 s/p DC-CV x 2  - Seen by EP. Not a candidate for ablation due to body habitus  (could consider AVN ablation and CRT, if needed)  - Had post-op AF with RVR post VAD  - 10/23 S/P DC-CV with restoration of NSR.  - IN SR. Continue amio 200 daily  - On heparin/coumadin as above.    5. HTN  - MAPs improved on increased doxazosin dose  - If MAP > 100 can give IV hydralazine prn   6. CKD stage 3B  - Has solitary  kidney.   - Baseline creatinine is 1.5-1.8.  - Scr 1.5  7. OSA  - Continue CPAP    8. DM2  - On Jardiance.  - Hgb A1c 9.4. -> 5.9  - PCP following    9. Polyarticular gout  - quiescent    10. Depression/anxiety  - Continue Zoloft '150mg'$  daily    11. Morbid obesity  -Body mass index is 44.92 kg/m. - Holding GLP1RA for OR (last dose 11/2)  I reviewed the LVAD parameters from today, and compared the results to the patient's prior recorded data.  No programming changes were made.  The LVAD is functioning within specified parameters.  The patient performs LVAD self-test daily.  LVAD interrogation was negative for any significant power changes, alarms or PI events/speed drops.  LVAD equipment check completed and is in good working order.  Back-up equipment present.   LVAD education done on emergency procedures and precautions and reviewed exit site care.  Length of Stay: [place], AGACNP-BC  8:14 AM  VAD Team --- VAD ISSUES ONLY--- Pager [phone] (7am - 7am)  Advanced Heart Failure Team  Pager [phone] (M-F; 7a - 5p)  Please contact [facility] for night-coverage after hours (5p -7a ) and weekends on [email or web address]

## 2022-08-25 NOTE — Progress Notes (Signed)
Nutrition Follow-up  DOCUMENTATION CODES:   Obesity unspecified  INTERVENTION:  Continue Heart Healthy diet as ordered Continue Juven BID with 8 oz of apple juice daily Continue double protein portions with all meals Continue MVI with minerals daily  NUTRITION DIAGNOSIS:   Increased nutrient needs related to wound healing, acute illness as evidenced by estimated needs.  Ongoing, addressing via nutrition supplements and meals  GOAL:   Patient will meet greater than or equal to 90% of their needs  Goal met  MONITOR:   PO intake, Supplement acceptance, Labs, Weight trends, Skin  REASON FOR ASSESSMENT:   Rounds    ASSESSMENT:   47 yo male admitted with abdominal wall cellulitis and drive line infection. PMH includes LVAD HM III (07/21/2021), CKD stage 2, HTN, renal cell carcinoma with nephrectomy in 2014, CHF  11/06 - admitted 11/08 - s/p excisional debridement of VAD drive line tunnel infection with excision of infected tissues including skin, subcutaneous fat, abdominal fascia, wound VAC placement 11/14 - s/p abdominal wound debridement, wound VAC change  Plans to d/c once INR >/=1.7, likely tomorrow. Plan for wound VAC to be changed as outpatient next week.   Pt relaxing comfortably in bed. No family present at bedside. He endorses ongoing pain. Reports that he is eating well. He continues to drink the Juven supplements but does not enjoy them. Encouraged him to continue to consume them post- discharge and made him aware he is able to buy unflavored Juven for possible better tolerance.    Meal completions: 11/16: 100% x recorded meals  11/20: 100% breakfast  MedicationsL SSI 0-9 units TID, metformin, MVI, protonix, warfarin, zinc 234m daily, IV abx  Labs: BUN 41, Cr 1.50, GFR 57, INR 1.6, CBG's 100-124 x24 hours  UOP: 1.5L x12 hours I/O's: -4.0L since 11/7  Diet Order:   Diet Order             Diet Heart Room service appropriate? Yes; Fluid consistency:  Thin  Diet effective now                   EDUCATION NEEDS:   Education needs have been addressed  Skin:  Skin Assessment: Skin Integrity Issues: Skin Integrity Issues:: Wound VAC Wound Vac: drive line infection-debridment Other: Driveline infection: purulent drainage; cellulitis to abdomen  Last BM:  11/20  Height:   Ht Readings from Last 1 Encounters:  08/14/22 _0  (1.702 m)    Weight:   Wt Readings from Last 1 Encounters:  08/24/22 130.1 kg   BMI:  Body mass index is 44.92 kg/m.  Estimated Nutritional Needs:   Kcal:  2000-2200 kcals  Protein:  115-130 g  Fluid:  1.8 L  AClayborne Dana RDN, LDN Clinical Nutrition

## 2022-08-25 NOTE — Progress Notes (Signed)
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 Cyndia Bent under Destination Therapy criteria due to BMI and chronic kidney disease.    Pt lying in bed on my arrival. Reports pain at wound vac site. PRN pain medication helps with this. Discharge when INR > 1.7. Will recheck this afternoon.  Currently on Cefazolin 2g Q 8 hrs per ID. Will need to discharge with IV Cefazolin through PICC line (6 weeks from 11/14) then transition to Cefadroxil 500 mg PO BID for chronic suppression per ID.   Pt will need wound vac and home IV antibiotics at hospital discharge. Home wound vac at bedside. Home health arranged with [facility] and [facility].   Vital signs: Temp:  97.9 HR: 79 NSR Doppler:98 Auto cuff: 113/85 (92) O2 Sat: 93% on RA Wt: 298>300.9>300.4>300.9>290.4>286.4>285.7>287.3>288.5>289>286.6>286.8 lbs  LVAD interrogation reveals:  Speed: 6250 Flow: 5.2 Power:  5.2 w PI: 2.3 Hct: 38   Alarms:  none Events: rare   Fixed speed:  6200 Low speed limit: 5900   Drive Line: Wound vac in place. Continuous suction at -100. No alarms overnight. Minimal sanguinous drainage in canister. Anchor secure. Plan for wound vac to be changed in clinic next week per Dr Darcey Nora and VAD coordinator.    Labs:  LDH trend: 130>147>128>126>149>147>186>137>149>141>157>128>133   INR trend: 1.4>1.2>1.1>1.0>1.1>1.0>1.0>1.1>1.2>1.4>1.5>1.6  WBC: 11.5>10.9>9.1>7.1>7.2>7.6>10.7>10.3>9.8>9.1>8.9>8.3>7.7  Anticoagulation Plan: -INR Goal: 2.0 - 2.5  -ASA Dose: none due to allergy     Blood Products: N/A    Device: - [employer] single ICD -Therapies: on per BS rep on 08/06/21     Infection:  08/10/22>>driveline cx -- FEW STAPHYLOCOCCUS AUREUS  08/10/22>>BC x 2 -- NO GROWTH x 5 DAYS; final 08/12/22>> driveline cx OR -- NGTD; final 08/18/22>> driveline cx OR)-- no  growth 2 days; final pending  Drips:  Heparin 500 units/hr  Plan/Recommendations:  Call VAD Coordinator if any VAD equipment or drive line issues. Call VAD coordinator if any wound vac issues.  Bobbye Morton RN,BSN [place] Coordinator  Office: [phone]  24/7 Pager: [phone]

## 2022-08-25 NOTE — TOC CM/SW Note (Addendum)
HF TOC CM notified [facility] Coordinator to make aware of dc tomorrow.. Request meds up from TOC at dc. [place], Heart Failure TOC CM [phone]

## 2022-08-25 NOTE — Progress Notes (Signed)
Pt refuses CPAP for tonight.   

## 2022-08-25 NOTE — Progress Notes (Addendum)
[facility] for Heparin & Coumadin Indication:  VAD  Allergies  Allergen Reactions   Aspirin Shortness Of Breath, Itching and Rash     Burning sensation (Patient reports he tolerates other NSAIDS)    Bee Venom Hives and Swelling   Lisinopril Cough   Shrimp (Diagnostic) Rash    Patient Measurements: Height: '5\' 7"'$  (170.2 cm) Weight:  (Refused to stand on the scale/promised to stand later for a weight/RN notified) IBW/kg (Calculated) : 66.1 Heparin Dosing Weight: 98 kg  Vital Signs: Temp: 98 F (36.7 C) (11/21 0539) Temp Source: Oral (11/21 0539) BP: 103/77 (11/21 0539) Pulse Rate: 84 (11/20 2000)  Labs: Recent Labs    08/23/22 0544 08/24/22 0520 08/24/22 0923 08/25/22 0550  HGB 12.4* 12.0*  --  11.9*  HCT 38.1* 37.6*  --  37.3*  PLT 286 278  --  274  LABPROT 17.1* 18.3*  --  19.0*  INR 1.4* 1.5*  --  1.6*  HEPARINUNFRC <0.10* <0.10*  --  <0.10*  CREATININE 1.51*  --  1.52* 1.50*    Estimated Creatinine Clearance: 79 mL/min (A) (by C-G formula based on SCr of 1.5 mg/dL (H)).  Assessment: 46 yo male with a VAD HM3, presents to clinic w/ signs of  abdominal wall cellulitis and DL infection. + drainage from site. + low grade fever 99.8.  Patient is managed as an outpatient on Warfarin therapy. Pharmacy consulted to start a heparin infusion when INR < 2.  Heparin level remains undetectable, LDH and CBC stable, INR up slowly to 1.6 after multiple days of higher doses. No s/sx of bleeding or infusion issues.  PTA Warfarin - 8 mg daily, except 4 mg on Tues and Sat.  Goal of Therapy:  Heparin level < 0.3 INR 2-2.5 post debridement  Monitor platelets by anticoagulation protocol: Yes   Plan:  Continue IV heparin at low rate of 500 units/hr as ordered Warfarin 12.5 mg tonight  Daily PT/INR, heparin level, and CBC.  Antonietta Jewel, PharmD, [place] Clinical Pharmacist  Phone: [phone] 08/25/2022 7:46 AM  Please check AMION for all  [place] phone numbers After 10:00 PM, call [place] [phone]

## 2022-08-25 NOTE — Progress Notes (Signed)
Mobility Specialist Progress Note    08/25/22 1455  Mobility  Activity Ambulated independently in hallway  Level of Assistance Independent after set-up  Assistive Device None  Distance Ambulated (ft) 1200 ft  Activity Response Tolerated well  Mobility Referral Yes  $Mobility charge 1 Mobility   Pt received sitting EOB and agreeable. No complaints on walk. Returned to chair with call [place] in reach.    [name] Mobility Specialist  Please Psychologist, sport and exercise or Rehab Office at [phone]

## 2022-08-25 NOTE — Plan of Care (Signed)
  Problem: Education: Goal: Patient will understand all VAD equipment and how it functions Outcome: Progressing Goal: Patient will be able to verbalize current INR target range and antiplatelet therapy for discharge home Outcome: Progressing   Problem: Cardiac: Goal: LVAD will function as expected and patient will experience no clinical alarms Outcome: Progressing   Problem: Education: Goal: Knowledge of General Education information will improve Description: Including pain rating scale, medication(s)/side effects and non-pharmacologic comfort measures Outcome: Progressing   Problem: Health Behavior/Discharge Planning: Goal: Ability to manage health-related needs will improve Outcome: Progressing   Problem: Clinical Measurements: Goal: Ability to maintain clinical measurements within normal limits will improve Outcome: Progressing Goal: Will remain free from infection Outcome: Progressing Goal: Diagnostic test results will improve Outcome: Progressing Goal: Respiratory complications will improve Outcome: Progressing Goal: Cardiovascular complication will be avoided Outcome: Progressing   Problem: Activity: Goal: Risk for activity intolerance will decrease Outcome: Progressing   Problem: Nutrition: Goal: Adequate nutrition will be maintained Outcome: Progressing   Problem: Coping: Goal: Level of anxiety will decrease Outcome: Progressing   Problem: Elimination: Goal: Will not experience complications related to bowel motility Outcome: Progressing Goal: Will not experience complications related to urinary retention Outcome: Progressing   Problem: Pain Managment: Goal: General experience of comfort will improve Outcome: Progressing   Problem: Safety: Goal: Ability to remain free from injury will improve Outcome: Progressing   Problem: Skin Integrity: Goal: Risk for impaired skin integrity will decrease Outcome: Progressing   Problem: Education: Goal: Ability  to describe self-care measures that may prevent or decrease complications (Diabetes Survival Skills Education) will improve Outcome: Progressing Goal: Individualized Educational Video(s) Outcome: Progressing   Problem: Coping: Goal: Ability to adjust to condition or change in health will improve Outcome: Progressing   Problem: Fluid Volume: Goal: Ability to maintain a balanced intake and output will improve Outcome: Progressing   Problem: Health Behavior/Discharge Planning: Goal: Ability to identify and utilize available resources and services will improve Outcome: Progressing Goal: Ability to manage health-related needs will improve Outcome: Progressing   Problem: Metabolic: Goal: Ability to maintain appropriate glucose levels will improve Outcome: Progressing   Problem: Nutritional: Goal: Maintenance of adequate nutrition will improve Outcome: Progressing Goal: Progress toward achieving an optimal weight will improve Outcome: Progressing   Problem: Skin Integrity: Goal: Risk for impaired skin integrity will decrease Outcome: Progressing   Problem: Tissue Perfusion: Goal: Adequacy of tissue perfusion will improve Outcome: Progressing

## 2022-08-25 NOTE — Progress Notes (Signed)
7 Days Post-Op Procedure(s) (LRB): ABDOMINAL WOUND DEBRIDEMENT (N/A) WOUND VAC CHANGE (N/A) Subjective: No complaints about Wound VAC- sponge compressed w/o alarms INR still too low for discharge  Objective: Vital signs in last 24 hours: Temp:  [97.8 F (36.6 C)-98.6 F (37 C)] 98 F (36.7 C) (11/21 1229) Pulse Rate:  [79-84] 79 (11/21 0830) Cardiac Rhythm: Normal sinus rhythm (11/21 0830) Resp:  [15-24] 24 (11/21 1229) BP: (103-113)/(77-97) 106/91 (11/21 1229) SpO2:  [93 %-96 %] 93 % (11/21 0539)  Hemodynamic parameters for last 24 hours:  nsr  Intake/Output from previous day: 11/20 0701 - 11/21 0700 In: 880.1 [P.O.:360; I.V.:120.1; IV Piggyback:400] Out: 1500 [Urine:1500] Intake/Output this shift: Total I/O In: 4.7 [I.V.:4.7] Out: -        Exam    General- alert and comfortable    Neck- no JVD, no cervical adenopathy palpable, no carotid bruit , normal VAD hum   Lungs- clear without rales, wheezes   Cor- regular rate and rhythm, no murmur , gallop   Abdomen- soft, non-tender   Extremities - warm, non-tender, minimal edema   Neuro- oriented, appropriate, no focal weakness   Lab Results: Recent Labs    08/24/22 0520 08/25/22 0550  WBC 8.3 7.7  HGB 12.0* 11.9*  HCT 37.6* 37.3*  PLT 278 274   BMET:  Recent Labs    08/24/22 0923 08/25/22 0550  NA 139 139  K 4.6 4.3  CL 105 106  CO2 25 24  GLUCOSE 112* 117*  BUN 38* 41*  CREATININE 1.52* 1.50*  CALCIUM 9.7 9.8    PT/INR:  Recent Labs    08/25/22 0550  LABPROT 19.0*  INR 1.6*   ABG    Component Value Date/Time   PHART 7.368 07/25/2021 0436   HCO3 28.9 (H) 07/25/2021 0436   TCO2 30 07/25/2021 0436   ACIDBASEDEF 1.0 07/24/2021 0449   O2SAT 73.7 08/05/2021 0515   CBG (last 3)  Recent Labs    08/24/22 2106 08/25/22 0604 08/25/22 1227  GLUCAP 128* 124* 100*    Assessment/Plan: S/P Procedure(s) (LRB): ABDOMINAL WOUND DEBRIDEMENT (N/A) WOUND VAC CHANGE (N/A) Ready for home wound care  with VAC and IV antibiotics for abdominal wall cellulitis with infected VAD tunnel  Plan wound VAC chg in clinic early next week- prob Monday   LOS: 15 days    Dahlia Byes 08/25/2022

## 2022-08-26 ENCOUNTER — Encounter (HOSPITAL_COMMUNITY): Payer: Self-pay

## 2022-08-26 ENCOUNTER — Other Ambulatory Visit (HOSPITAL_COMMUNITY): Payer: Self-pay

## 2022-08-26 ENCOUNTER — Other Ambulatory Visit: Payer: Self-pay

## 2022-08-26 ENCOUNTER — Other Ambulatory Visit (HOSPITAL_COMMUNITY): Payer: Self-pay | Admitting: Unknown Physician Specialty

## 2022-08-26 DIAGNOSIS — T827XXA Infection and inflammatory reaction due to other cardiac and vascular devices, implants and grafts, initial encounter: Secondary | ICD-10-CM | POA: Diagnosis not present

## 2022-08-26 LAB — PROTIME-INR
INR: 2 — ABNORMAL HIGH (ref 0.8–1.2)
Prothrombin Time: 22.7 seconds — ABNORMAL HIGH (ref 11.4–15.2)

## 2022-08-26 LAB — BASIC METABOLIC PANEL
Anion gap: 9 (ref 5–15)
BUN: 37 mg/dL — ABNORMAL HIGH (ref 6–20)
CO2: 25 mmol/L (ref 22–32)
Calcium: 9.9 mg/dL (ref 8.9–10.3)
Chloride: 105 mmol/L (ref 98–111)
Creatinine, Ser: 1.53 mg/dL — ABNORMAL HIGH (ref 0.61–1.24)
GFR, Estimated: 56 mL/min — ABNORMAL LOW (ref >60)
Glucose, Bld: 98 mg/dL (ref 70–99)
Potassium: 4.6 mmol/L (ref 3.5–5.1)
Sodium: 139 mmol/L (ref 135–145)

## 2022-08-26 LAB — LACTATE DEHYDROGENASE: LDH: 135 U/L (ref 98–192)

## 2022-08-26 LAB — CBC
HCT: 37.5 % — ABNORMAL LOW (ref 39.0–52.0)
Hemoglobin: 12 g/dL — ABNORMAL LOW (ref 13.0–17.0)
MCH: 29.4 pg (ref 26.0–34.0)
MCHC: 32 g/dL (ref 30.0–36.0)
MCV: 91.9 fL (ref 80.0–100.0)
Platelets: 282 10*3/uL (ref 150–400)
RBC: 4.08 MIL/uL — ABNORMAL LOW (ref 4.22–5.81)
RDW: 14.4 % (ref 11.5–15.5)
WBC: 7.8 10*3/uL (ref 4.0–10.5)
nRBC: 0 % (ref 0.0–0.2)

## 2022-08-26 LAB — GLUCOSE, CAPILLARY
Glucose-Capillary: 98 mg/dL (ref 70–99)
Glucose-Capillary: 111 mg/dL — ABNORMAL HIGH (ref 70–99)

## 2022-08-26 LAB — HEPARIN LEVEL (UNFRACTIONATED): Heparin Unfractionated: <0.1 IU/mL — ABNORMAL LOW (ref 0.30–0.70)

## 2022-08-26 MED ORDER — ZINC SULFATE 220 (50 ZN) MG PO TABS
220.0000 mg | ORAL_TABLET | Freq: Every day | ORAL | 6 refills | Status: DC
  Filled 2022-08-26: qty 30, 30d supply, fill #0
  Filled 2022-09-25: qty 30, 30d supply, fill #1
  Filled 2022-10-16: qty 30, 30d supply, fill #0

## 2022-08-26 MED ORDER — DOXAZOSIN MESYLATE 1 MG PO TABS
2.0000 mg | ORAL_TABLET | Freq: Every day | ORAL | 3 refills | Status: DC
  Filled 2022-08-26: qty 60, 30d supply, fill #0

## 2022-08-26 MED ORDER — HEPARIN SOD (PORK) LOCK FLUSH 100 UNIT/ML IV SOLN
250.0000 [IU] | INTRAVENOUS | Status: AC | PRN
  Administered 2022-08-26: 250 [IU]

## 2022-08-26 MED ORDER — CALCIUM CARBONATE ANTACID 500 MG PO CHEW
1.0000 | CHEWABLE_TABLET | Freq: Once | ORAL | Status: AC | PRN
  Administered 2022-08-26: 200 mg via ORAL
  Filled 2022-08-26: qty 1

## 2022-08-26 MED ORDER — WARFARIN SODIUM 4 MG PO TABS
ORAL_TABLET | ORAL | 11 refills | Status: DC
  Filled 2022-08-26: qty 90, 30d supply, fill #0
  Filled 2022-10-16: qty 90, 45d supply, fill #0
  Filled 2022-12-25: qty 90, 45d supply, fill #1
  Filled 2023-02-19: qty 90, 45d supply, fill #2
  Filled 2023-04-12: qty 90, 45d supply, fill #3
  Filled 2023-06-02: qty 90, 45d supply, fill #4
  Filled 2023-06-18: qty 90, 45d supply, fill #5
  Filled 2023-08-03 – 2023-08-04 (×3): qty 90, 45d supply, fill #6

## 2022-08-26 MED ORDER — "INSULIN SYRINGE-NEEDLE U-100 30G X 1/2"" 1 ML MISC"
11 refills | Status: DC
  Filled 2022-08-26: qty 100, 30d supply, fill #0

## 2022-08-26 MED ORDER — HYDROCODONE-ACETAMINOPHEN 10-325 MG PO TABS
1.0000 | ORAL_TABLET | Freq: Four times a day (QID) | ORAL | 0 refills | Status: DC | PRN
  Filled 2022-08-26: qty 20, 5d supply, fill #0

## 2022-08-26 MED ORDER — ACCU-CHEK FASTCLIX LANCETS MISC
Freq: Four times a day (QID) | 11 refills | Status: AC
  Filled 2022-08-26: qty 102, 25d supply, fill #0

## 2022-08-26 MED ORDER — GABAPENTIN 400 MG PO CAPS
400.0000 mg | ORAL_CAPSULE | Freq: Three times a day (TID) | ORAL | 6 refills | Status: DC
  Filled 2022-08-26: qty 30, 10d supply, fill #0

## 2022-08-26 NOTE — Progress Notes (Addendum)
Advanced Heart Failure VAD Team Note  PCP-Cardiologist: Glori Bickers, MD   Subjective:    11/6: Admit w/ DL infection. CT with diffuse soft-tissue involvement but no overt abscess. Wound Cx growing few staph aureus. BCx NG. 11/8: s/p extensive debridement + wound vac. Wound Cx from OR NG. 11/14: OR for debridement + wound vac exchange 11/15: Bleeding at Wound vac site. Vessels stitched and vac changed.   Still with some pain at wound vac site. Remains on IV abx. Denies CP or SOB.   Heparin gtt stopped this am, on warfarin  INR 1.2 -> 1.4 > 1.5 > 1.6> 2. No bleeding  LVAD INTERROGATION:  HeartMate III LVAD:   Flow 5.4  liters/min, speed 6200, power 5.2 , PI 2.3, x2 PI event this am  Objective:    Vital Signs:   Temp:  [97.7 F (36.5 C)-98.3 F (36.8 C)] 97.7 F (36.5 C) (11/22 0531) Pulse Rate:  [79-85] 85 (11/21 1927) Resp:  [15-24] 20 (11/22 0531) BP: (97-119)/(83-96) 114/83 (11/22 0531) SpO2:  [96 %-97 %] 97 % (11/22 0531) Weight:  [130.2 kg] 130.2 kg (11/22 0531) Last BM Date : 08/25/22 Mean arterial Pressure-90s  Intake/Output:   Intake/Output Summary (Last 24 hours) at 08/26/2022 0727 Last data filed at 08/26/2022 3419 Gross per 24 hour  Intake 119.56 ml  Output 1400 ml  Net -1280.44 ml     Physical Exam   General:  Well appearing. No resp difficulty HEENT: Normal Neck: supple. JVP not elevated. Carotids 2+ bilat; no bruits. No lymphadenopathy or thyromegaly appreciated. Cor: Mechanical heart sounds with LVAD hum present. Lungs: Clear Abdomen: soft, nontender, nondistended. No hepatosplenomegaly. No bruits or masses. Good bowel sounds. Driveline: C/D/I; securement device intact, wound vac in place with good seal Extremities: no cyanosis, clubbing, rash, edema Neuro: alert & orientedx3, cranial nerves grossly intact. moves all 4 extremities w/o difficulty. Affect pleasant   Telemetry   SR 70s (Personally reviewed)    Labs   Basic Metabolic  Panel: Recent Labs  Lab 08/22/22 0555 08/23/22 0544 08/24/22 0923 08/25/22 0550 08/26/22 0545  NA 140 137 139 139 139  K 4.8 4.7 4.6 4.3 4.6  CL 105 101 105 106 105  CO2 '25 26 25 24 25  '$ GLUCOSE 107* 102* 112* 117* 98  BUN 40* 39* 38* 41* 37*  CREATININE 1.62* 1.51* 1.52* 1.50* 1.53*  CALCIUM 10.0 10.1 9.7 9.8 9.9    Liver Function Tests: No results for input(s): "AST", "ALT", "ALKPHOS", "BILITOT", "PROT", "ALBUMIN" in the last 168 hours.  No results for input(s): "LIPASE", "AMYLASE" in the last 168 hours. No results for input(s): "AMMONIA" in the last 168 hours.  CBC: Recent Labs  Lab 08/22/22 0555 08/23/22 0544 08/24/22 0520 08/25/22 0550 08/26/22 0545  WBC 9.1 8.9 8.3 7.7 7.8  HGB 12.4* 12.4* 12.0* 11.9* 12.0*  HCT 39.1 38.1* 37.6* 37.3* 37.5*  MCV 92.4 91.1 92.6 91.9 91.9  PLT 293 286 278 274 282    INR: Recent Labs  Lab 08/22/22 0555 08/23/22 0544 08/24/22 0520 08/25/22 0550 08/26/22 0545  INR 1.2 1.4* 1.5* 1.6* 2.0*    Other results:   Imaging   No results found.   Medications:     Scheduled Medications:  allopurinol  100 mg Oral Daily   amiodarone  200 mg Oral Daily   amLODipine  10 mg Oral Daily   Chlorhexidine Gluconate Cloth  6 each Topical Daily   colchicine  0.6 mg Oral Daily   doxazosin  2  mg Oral Daily   gabapentin  400 mg Oral TID   insulin aspart  0-9 Units Subcutaneous TID WC   isosorbide-hydrALAZINE  2 tablet Oral TID   metFORMIN  1,000 mg Oral BID WC   multivitamin with minerals  1 tablet Oral Daily   nutrition supplement (JUVEN)  1 packet Oral BID BM   pantoprazole  40 mg Oral Daily   sacubitril-valsartan  1 tablet Oral BID   sertraline  150 mg Oral Daily   sodium chloride flush  10-40 mL Intracatheter Q12H   spironolactone  25 mg Oral Daily   traZODone  100 mg Oral QHS   Warfarin - Pharmacist Dosing Inpatient   Does not apply q1600   zinc sulfate  220 mg Oral Daily    Infusions:  sodium chloride 10 mL/hr at  08/21/22 5643    ceFAZolin (ANCEF) IV 2 g (08/26/22 0555)    PRN Medications: sodium chloride, acetaminophen, alum & mag hydroxide-simeth, hydrALAZINE, HYDROcodone-acetaminophen, morphine injection, naloxone, mouth rinse, oxyCODONE, sodium chloride flush, traMADol   Patient Profile   46 y.o. male with a history of  poorly controlled HTN, R renal cell carcinoma s/p nephrectomy, CKD IIIa, DM2, OSA, gout, morbid obesity and systolic HF due to NICM. 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    Assessment/Plan:    1. 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 Bedside wound vac change and vessel cauterization 11/15 2/2 hematoma.  - will need to discharge with IV cefazolin through PICC line (6 weeks from 11/14) then transition to cefadroxil 500 mg PO bid for chronic suppression per ID. VAC will be monitored through VAD clinic. Dr. Darcey Nora following. - Home wound vac in room and [facility] arranged - Site stable. Afebrile - s/p Wound Vac change 11/20 at bedside  2.  Chronic systolic CHF  - Nonischemic CM, end-stage  - EF 2012; 20-25%  - Echo  8/22: EF < 20%, severe LV dilation, moderate RV dysfunction.   - RHC (8/22): well compensated hemodynamics.  - s/p S-ICD  - S/p HM3 LVAD 10/17 (DT)  - Volume status ok. Continue to hold diuretics - Continue Bidil 2 tabs tid  - Continue spironolactone 25 mg daily  - Continue entresto 97/103 bid  - Resume ozempic after discharge.   3. VAD  - s/p HM-3 placement 07/21/21  - VAD interrogated personally. Parameters stable. - LDH ok - Stop heparin gtt, Continue coumadin per pharmacy.  INR 1.4 -> 1.5 > 1.6 >2 today.    4 Atrial fibrillation: Paroxysmal  - Had AF with RVR 7/22 s/p DC-CV x 2  - Seen by EP. Not a candidate for ablation due to body habitus  (could consider AVN ablation and CRT, if needed)  - Had post-op AF with RVR post VAD  - 10/23 S/P DC-CV with restoration of NSR.  - IN SR. Continue amio 200 daily  - On coumadin   5. HTN  - MAPs improved on increased doxazosin dose  - If MAP > 100 can give IV hydralazine prn   6. CKD stage 3B  - Has solitary kidney.   -  Baseline creatinine is 1.5-1.8.  - Scr 1.5  7. OSA  - Continue CPAP    8. DM2  - On Jardiance.  - Hgb A1c 9.4. -> 5.9  - PCP following    9. Polyarticular gout  - quiescent    10. Depression/anxiety  - Continue Zoloft '150mg'$  daily    11. Morbid obesity  -Body mass index is 44.96 kg/m. - Holding GLP1RA for OR (last dose 11/2)  I reviewed the LVAD parameters from today, and compared the results to the patient's prior recorded data.  No programming changes were made.  The LVAD is functioning within specified parameters.  The patient performs LVAD self-test daily.  LVAD interrogation was negative for any significant power changes, alarms or PI events/speed drops.  LVAD equipment check completed and is in good working order.  Back-up equipment present.   LVAD education done on emergency procedures and precautions and reviewed exit site care.  Ok to d/c today, will send meds to TOC. INR 2, Warfarin per PharmD  Length of Stay: [place], AGACNP-BC  7:27 AM  VAD Team --- VAD ISSUES ONLY--- Pager [phone] (7am - 7am)  Advanced Heart Failure Team  Pager [phone] (M-F; 7a - 5p)  Please contact [facility] for night-coverage after hours (5p -7a ) and weekends on [email or web address]  Patient seen and examined with the above-signed Advanced Practice Provider and/or Housestaff. I personally reviewed laboratory data, imaging studies and relevant notes. I independently examined the patient and  formulated the important aspects of the plan. I have edited the note to reflect any of my changes or salient points. I have personally discussed the plan with the patient and/or family.  INR 2.0 Now off heparin. Wound vac working well. Mild pain at site. AFebrile on IV abx  General:  NAD.  HEENT: normal  Neck: supple. JVP not elevated.  Carotids 2+ bilat; no bruits. No lymphadenopathy or thryomegaly appreciated. Cor: LVAD hum.  Lungs: Clear. Abdomen: obese soft, nontender, non-distended. No hepatosplenomegaly. No bruits or masses. Good bowel sounds. Wound vac site ok.  Extremities: no cyanosis, clubbing, rash. Warm no edema  Neuro: alert & oriented x 3. No focal deficits. Moves all 4 without problem   INR 2.0. Heparin off. [place]as for d/c today with home abx and wound care. All supplies arranged.   VAD interrogated personally. Parameters stable.  Glori Bickers, MD  8:17 AM

## 2022-08-26 NOTE — Progress Notes (Signed)
Mobility Specialist Progress Note    08/26/22 1531  Mobility  Activity Ambulated independently in hallway  Level of Assistance Independent after set-up  Assistive Device None  Distance Ambulated (ft) 420 ft  Activity Response Tolerated well  Mobility Referral Yes  $Mobility charge 1 Mobility   Pre-Mobility: 76 HR  Pt received in bed and agreeable. No complaints on walk. Returned to bed with call [name] in reach.    [name] Mobility Specialist  Please Psychologist, sport and exercise or Rehab Office at [phone]

## 2022-08-26 NOTE — Progress Notes (Signed)
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 Cyndia Bent under Destination Therapy criteria due to BMI and chronic kidney disease.    Pt lying in bed on my arrival. Reports he did not sleep well due to indigestion overnight.   Pending discharge this afternoon. Will plan for wound vac dressing change in clinic with Dr. Darcey Nora and INR on Monday.  Currently on Cefazolin 2g Q 8 hrs per ID. Will need to discharge with IV Cefazolin through PICC line (6 weeks from 11/14) then transition to Cefadroxil 500 mg PO BID for chronic suppression per ID.   Home wound vac at bedside. Home health arranged with [facility] and [facility]. [facility] aware of pending discharge today and reaching out to Tamika to arrange education.  Vital signs: Temp: HR: 77 NSR Doppler:96 Auto cuff: 104/88 (96) O2 Sat: 97% on RA Wt: 298>300.9>300.4>300.9>290.4>286.4>285.7>287.3>288.5>289>286.6>286.8>287 lbs  LVAD interrogation reveals:  Speed: 6200 Flow: 5.3 Power:  5.1 w PI: 2.1 Hct: 38   Alarms:  none Events: rare   Fixed speed:  6200 Low speed limit: 5900   Drive Line: Wound vac in place. Continuous suction at -100. No alarms overnight. Minimal sanguinous drainage in canister. Anchor secure. Plan for wound vac to be changed in clinic next week per Dr Darcey Nora and VAD coordinator.    Labs:  LDH trend: 130>147>128>126>149>147>186>137>149>141>157>128>133>135  INR trend: 1.4>1.2>1.1>1.0>1.1>1.0>1.0>1.1>1.2>1.4>1.5>1.6>2.0  WBC: 11.5>10.9>9.1>7.1>7.2>7.6>10.7>10.3>9.8>9.1>8.9>8.3>7.7>7.8  Anticoagulation Plan: -INR Goal: 2.0 - 2.5  -ASA Dose: none due to allergy   Blood Products: N/A  Device: - [employer] single ICD -Therapies: on per BS rep on 08/06/21   Infection:  08/10/22>>driveline cx -- FEW STAPHYLOCOCCUS AUREUS  08/10/22>>BC x 2 -- NO GROWTH x 5 DAYS;  final 08/12/22>> driveline cx OR -- NGTD; final 08/18/22>> driveline cx OR)-- no growth 2 days; final pending  Drips:  Heparin 500 units/hr- OFF  Plan/Recommendations:  Call VAD Coordinator if any VAD equipment or drive line issues. Call VAD coordinator if any wound vac issues. Follow up in clinic Monday for dressing change and INR  Bobbye Morton RN,BSN [place] Coordinator  Office: [phone]  24/7 Pager: [phone]

## 2022-08-26 NOTE — Progress Notes (Signed)
Patient paged following discharge stating that his wound vac was alarming "blockage". VAD Coordinator was able to troubleshoot alarm to clear it by walking patient and wife through how to change canister at home. Pt endorses home screen of wound vac stated continuous therapy ON and dressing was clean dry and intact. Pt reminded of home health nurse services with [facility] for additional wound vac support if needed at home.  Bobbye Morton RN, BSN VAD Coordinator 24/7 Pager [phone]

## 2022-08-26 NOTE — Care Management (Signed)
  Transition of Care [facility]) Screening Note   Patient Details  Name: Jeffrey [name] Date of Birth: 02/07/1976   Transition of Care [facility]) CM/SW Contact:    Bethena Roys, RN Phone Number: 08/26/2022, 10:07 AM    Transition of Care Department [facility]) has reviewed the patient. Referral has been submitted to [place] from previous Case Manager. [facility] is trying to connect with girlfriend Tamika for education.[facility] will follow for [facility] RN Services. Case Manager will continue to monitor patient advancement through interdisciplinary progression rounds. If new patient transition needs arise, please place a TOC consult.

## 2022-08-26 NOTE — Progress Notes (Signed)
[place] for Heparin & Coumadin Indication:  VAD  Allergies  Allergen Reactions   Aspirin Shortness Of Breath, Itching and Rash     Burning sensation (Patient reports he tolerates other NSAIDS)    Bee Venom Hives and Swelling   Lisinopril Cough   Shrimp (Diagnostic) Rash    Patient Measurements: Height: '5\' 7"'$  (170.2 cm) Weight: 130.2 kg (287 lb 0.6 oz) IBW/kg (Calculated) : 66.1 Heparin Dosing Weight: 98 kg  Vital Signs: Temp: 97.7 F (36.5 C) (11/22 0531) Temp Source: Oral (11/22 0531) BP: 104/88 (11/22 0720) Pulse Rate: 77 (11/22 0720)  Labs: Recent Labs    08/24/22 0520 08/24/22 0923 08/25/22 0550 08/26/22 0545  HGB 12.0*  --  11.9* 12.0*  HCT 37.6*  --  37.3* 37.5*  PLT 278  --  274 282  LABPROT 18.3*  --  19.0* 22.7*  INR 1.5*  --  1.6* 2.0*  HEPARINUNFRC <0.10*  --  <0.10* <0.10*  CREATININE  --  1.52* 1.50* 1.53*    Estimated Creatinine Clearance: 77.4 mL/min (A) (by C-G formula based on SCr of 1.53 mg/dL (H)).  Assessment: 46 yo male with a VAD HM3, presents to clinic w/ signs of  abdominal wall cellulitis and DL infection. + drainage from site. + low grade fever 99.8.  Patient is managed as an outpatient on Warfarin therapy. Pharmacy consulted to start a heparin infusion when INR < 2.  Heparin level remains undetectable, LDH and CBC stable, INR now up to 2 after multiple days of higher doses. No s/sx of bleeding or infusion issues.  PTA Warfarin - 8 mg daily, except 4 mg on Tues and Sat.  Goal of Therapy:  Heparin level < 0.3 INR 2-2.5 post debridement  Monitor platelets by anticoagulation protocol: Yes   Plan:  Can stop heparin infusion Plan for discharge given therapeutic INR - will do 8 mg daily at discharge starting tonight with plans for INR check on Monday  Daily PT/INR and CBC.  Antonietta Jewel, PharmD, [place] Clinical Pharmacist  Phone: [phone] 08/26/2022 8:11 AM  Please check AMION for all [place] phone numbers After 10:00 PM, call [place] [phone]

## 2022-08-28 ENCOUNTER — Other Ambulatory Visit (HOSPITAL_COMMUNITY): Payer: Self-pay

## 2022-08-28 ENCOUNTER — Other Ambulatory Visit: Payer: Self-pay

## 2022-08-31 ENCOUNTER — Encounter: Payer: Self-pay | Admitting: Internal Medicine

## 2022-08-31 ENCOUNTER — Ambulatory Visit (INDEPENDENT_AMBULATORY_CARE_PROVIDER_SITE_OTHER): Payer: Medicaid Other | Admitting: Internal Medicine

## 2022-08-31 ENCOUNTER — Other Ambulatory Visit: Payer: Self-pay

## 2022-08-31 ENCOUNTER — Ambulatory Visit (HOSPITAL_COMMUNITY): Payer: Self-pay | Admitting: Pharmacist

## 2022-08-31 ENCOUNTER — Telehealth: Payer: Self-pay

## 2022-08-31 ENCOUNTER — Ambulatory Visit (HOSPITAL_COMMUNITY)
Admit: 2022-08-31 | Discharge: 2022-08-31 | Disposition: A | Discharge status: Home / Self Care | Payer: Medicaid Other | Attending: Cardiology | Admitting: Cardiology

## 2022-08-31 VITALS — HR 80 | Temp 97.8°F | Resp 16 | Ht 67.0 in | Wt 285.0 lb

## 2022-08-31 DIAGNOSIS — I5022 Chronic systolic (congestive) heart failure: Secondary | ICD-10-CM

## 2022-08-31 DIAGNOSIS — Z792 Long term (current) use of antibiotics: Secondary | ICD-10-CM | POA: Diagnosis not present

## 2022-08-31 DIAGNOSIS — T827XXA Infection and inflammatory reaction due to other cardiac and vascular devices, implants and grafts, initial encounter: Secondary | ICD-10-CM | POA: Diagnosis not present

## 2022-08-31 DIAGNOSIS — Z5181 Encounter for therapeutic drug level monitoring: Secondary | ICD-10-CM | POA: Insufficient documentation

## 2022-08-31 DIAGNOSIS — E1159 Type 2 diabetes mellitus with other circulatory complications: Secondary | ICD-10-CM

## 2022-08-31 DIAGNOSIS — Z7901 Long term (current) use of anticoagulants: Secondary | ICD-10-CM | POA: Insufficient documentation

## 2022-08-31 DIAGNOSIS — Z95811 Presence of heart assist device: Secondary | ICD-10-CM

## 2022-08-31 DIAGNOSIS — Z4801 Encounter for change or removal of surgical wound dressing: Secondary | ICD-10-CM | POA: Diagnosis not present

## 2022-08-31 LAB — PROTIME-INR
INR: 1.8 — ABNORMAL HIGH (ref 0.8–1.2)
Prothrombin Time: 20.3 seconds — ABNORMAL HIGH (ref 11.4–15.2)

## 2022-08-31 MED ORDER — ISOSORB DINITRATE-HYDRALAZINE 20-37.5 MG PO TABS
2.0000 | ORAL_TABLET | Freq: Three times a day (TID) | ORAL | 6 refills | Status: DC
  Filled 2022-08-31: qty 180, 30d supply, fill #0
  Filled 2022-10-16: qty 180, 30d supply, fill #1

## 2022-08-31 NOTE — Progress Notes (Signed)
Patient Active Problem List   Diagnosis Date Noted   Infection associated with driveline of left ventricular assist device (LVAD) ([place]) 08/10/2022   Presence of left ventricular assist device (LVAD) ([place]) 07/25/2021   Acute on chronic systolic CHF (congestive heart failure) ([place]) 07/18/2021   Cardiogenic shock ([place])    Atrial fibrillation with RVR ([place]) 04/30/2021   Acute hypoxemic respiratory failure (HCC)    Right wrist pain 04/16/2021   Absolute anemia    Rectal bleeding    Iron deficiency 12/25/2020   Long term current use of anticoagulant - apixaban 12/25/2020   Paroxysmal atrial fibrillation (HCC)    Lactic acidosis 11/22/2020   Hypotension 11/22/2020   Aspiration into airway    ICD (implantable cardioverter-defibrillator) battery depletion 05/15/2020   Endotracheal tube present    Respiratory failure ([place])    Acute encephalopathy    Acute pulmonary edema (HCC)    NICM (nonischemic cardiomyopathy) ([place]) 04/24/2020   S/P vasectomy 12/20/2019   Anxiety 12/20/2019   Hemoglobin A1C between 7% and 9% indicating borderline diabetic control ([place]) 12/20/2019   Seasonal allergies 12/20/2019   Insomnia 12/20/2019   Vasectomy evaluation 06/20/2019   Visual problems 06/20/2019   Blurry vision, bilateral 06/20/2019   Hyperglycemia 02/13/2019   Hyperosmolar (nonketotic) coma ([place]) 01/19/2019   AICD (automatic cardioverter/defibrillator) present 01/19/2019   Hyperlipidemia 12/07/2018   Follow-up exam 11/22/2018   Class 3 severe obesity due to excess calories with serious comorbidity and body mass index (BMI) of 45.0 to 49.9 in adult ([place]) 11/22/2018   Dizziness 11/22/2018   Lingular pneumonia 08/04/2018   Ventricular tachycardia ([place]) 12/30/2017   PAF (paroxysmal atrial fibrillation) ([place])    Dilated cardiomyopathy ([place])    Pollen allergy 02/17/2017   Dyslipidemia 02/17/2017   Hematochezia 11/08/2015   CKD (chronic kidney disease), stage III ([place]) 06/20/2015    Cardiac defibrillator in situ    Chronic systolic CHF (congestive heart failure) ([place]) 03/11/2015   Gouty arthritis 03/11/2015   Chest pain 12/28/2014   Acute renal failure superimposed on stage 3 chronic kidney disease ([place]) 04/08/2014   Aspirin allergy 04/08/2014   Renal cell carcinoma ([place])    Gout attack 11/10/2011   Morbid obesity ([place]) 10/23/2011   OSA (obstructive sleep apnea) 10/23/2011   Type 2 diabetes mellitus with stage 3 chronic kidney disease, with long-term current use of insulin ([place]) 10/22/2011   Hyperlipidemia associated with type 2 diabetes mellitus ([place]) 12/25/2010   Hypertension associated with diabetes ([place]) 12/25/2010    Patient's Medications  New Prescriptions   No medications on file  Previous Medications   ACCU-CHEK FASTCLIX LANCETS MISC    USE AS DIRECTED FOUR TIMES DAILY   ACETAMINOPHEN (TYLENOL) 325 MG TABLET    Take 2 tablets (650 mg total) by mouth every 6 (six) hours as needed for fever.   ALLOPURINOL (ZYLOPRIM) 100 MG TABLET    Take 1 tablet (100 mg total) by mouth daily.   AMIODARONE (PACERONE) 200 MG TABLET    Take 1 tablet (200 mg total) by mouth daily.   AMLODIPINE (NORVASC) 10 MG TABLET    Take 1 tablet (10 mg total) by mouth daily   CEFAZOLIN (ANCEF) IVPB    Inject 2 g into the vein every 8 (eight) hours. Indication:  LVAD deep driveline infection First Dose: Yes Last Day of Therapy:  09/28/2022 Labs - Once weekly:  CBC/D and BMP, Labs - Every other week:  ESR and CRP Method of  administration: IV Push Method of administration may be changed at the discretion of home infusion pharmacist based upon assessment of the patient and/or caregiver's ability to self-administer the medication ordered. Please pull PICC at the end of therapy   COLCHICINE 0.6 MG TABLET    Take 1 tablet (0.6 mg total) by mouth once daily.   DICLOFENAC SODIUM (VOLTAREN) 1 % GEL    Apply 2 g topically 4 (four) times daily.   DOCUSATE SODIUM (COLACE) 100 MG CAPSULE    Take 1  capsule (100 mg total) by mouth 2 (two) times daily.   DOXAZOSIN (CARDURA) 1 MG TABLET    Take 2 tablets (2 mg total) by mouth daily.   GABAPENTIN (NEURONTIN) 400 MG CAPSULE    Take 1 capsule (400 mg total) by mouth 3 (three) times daily.   GLUCOSE BLOOD TEST STRIP    Use as directed 4 times daily.   HYDROCODONE-ACETAMINOPHEN (NORCO) 10-325 MG TABLET    Take 1 tablet by mouth every 6 (six) hours as needed for severe pain.   INSULIN PEN NEEDLE (TRUEPLUS 5-BEVEL PEN NEEDLES) 29G X 12.7MM MISC    Use as directed in the morning, at noon, and at bedtime.   INSULIN SYRINGE-NEEDLE U-100 (ULTICARE INSULIN SYRINGE) 30G X 1/2" 1 ML MISC    USE TO INJECT INSULIN TWICE DAILY.   ISOSORBIDE-HYDRALAZINE (BIDIL) 20-37.5 MG TABLET    Take 2 tablets by mouth 3 (three) times daily.   MECLIZINE (ANTIVERT) 25 MG TABLET    Take 1 tablet (25 mg total) by mouth 3 (three) times daily as needed for dizziness.   METFORMIN (GLUCOPHAGE) 1000 MG TABLET    Take 1 tablet (1,000 mg total) by mouth 2 (two) times daily with a meal.   PANTOPRAZOLE (PROTONIX) 40 MG TABLET    Take 1 tablet (40 mg total) by mouth once daily.   SACUBITRIL-VALSARTAN (ENTRESTO) 97-103 MG    Take 1 tablet by mouth 2 (two) times daily.   SEMAGLUTIDE, 2 MG/DOSE, (OZEMPIC, 2 MG/DOSE,) 8 MG/3ML SOPN    Inject 2 mg into the skin once a week.   SERTRALINE (ZOLOFT) 100 MG TABLET    Take 1.5 tablets (150 mg total) by mouth daily.   SPIRONOLACTONE (ALDACTONE) 25 MG TABLET    Take 1 tablet (25 mg total) by mouth daily.   TORSEMIDE (DEMADEX) 20 MG TABLET    Take 1 tablet (20 mg total) by mouth daily as needed (If wt gain > 3 lb in 24 hr or > 5 lb in 1 week).   TRAZODONE (DESYREL) 100 MG TABLET    Take 1 tablet (100 mg total) by mouth at bedtime.   WARFARIN (COUMADIN) 4 MG TABLET    Take 2 tablet (8 mg) daily or as directed by HF clinic   ZINC SULFATE 220 (50 ZN) MG TABS    Take 1 tablet (220 mg total) by mouth daily.  Modified Medications   No medications on file   Discontinued Medications   No medications on file    Subjective: 46 year old male with hypertension, renal cell carcinoma status post nephrectomy, CKD stage IIIa, diabetes, OSA, gout, morbid obesity, systolic heart failure due to NICM status post HM 3 VAD  placement on 78/29/5621 complicated by driveline site infection with MSSA in March 2023 presents for hospital follow-up of driveline infection.  On arrival he had a temp 100.3, WBC 11.9 K CT showed fluid around lungs suspicious for infection, started on vancomycin and cefepime.  Wound cultures from  OSH grew MSSA.  During hospitalization he underwent I&D x 2 with wound VAC placement.  He was initially transition to cefadroxil for LVAD as a site infection.  Or cultures noted with no growth.  Patient discharged on cefazolin x 6 weeks EOT 12/25 transition to cefadroxil 500 mg p.o. twice daily for likely lifelong suppression Today: Pt reports he feels well. No issues with tolerance. Wound vac in place. Denies missed doses.  Review of Systems: Review of Systems  All other systems reviewed and are negative.   Past Medical History:  Diagnosis Date   Anxiety    Aspirin allergy    Childhood asthma    Chronic systolic CHF (congestive heart failure) ([place])    a. EF 20-25% in 2012. b. EF 45-50% in 10/2011 with nonischemic nuc - presumed NICM. c. 12/2014 Echo: Sev depressed LV fxn, sev dil LV, mild LVH, mild MR, sev dil LA, mildly reduced RV fxn.   CKD (chronic kidney disease) stage 2, GFR 60-89 ml/min    H/O vasectomy 12/2019   High cholesterol    Hypertension    Morbid obesity ([place])    Nephrolithiasis    OSA on CPAP    Paroxysmal atrial fibrillation ([place])    Presumed NICM    a. 04/2014 Myoview: EF 26%, glob HK, sev glob HK, ? prior infarct;  b. Never cathed 2/2 CKD.   Renal cell carcinoma ([place])    a. s/p Rt robotic assisted partial converted to radical nephrectomy on 01/2013.   Troponin level elevated    a. 04/2014, 12/2014: felt due to CHF.   Type  II diabetes mellitus (HCC)    Ventricular tachycardia (HCC)    a. appropriate ICD therapy 12/2017    Social History   Tobacco Use   Smoking status: Former    Packs/day: 0.25    Years: 22.00    Total pack years: 5.50    Types: Cigarettes    Quit date: 04/30/2015    Years since quitting: 7.3   Smokeless tobacco: Never  Vaping Use   Vaping Use: Never used  Substance Use Topics   Alcohol use: Not Currently    Comment: none   Drug use: Yes    Types: Marijuana    Family History  Problem Relation Age of Onset   Hypertension Mother    Diabetes Maternal Aunt    Heart attack Neg Hx    Stroke Neg Hx    Colon cancer Neg Hx    Pancreatic cancer Neg Hx    Stomach cancer Neg Hx    Liver cancer Neg Hx    Esophageal cancer Neg Hx     Allergies  Allergen Reactions   Aspirin Shortness Of Breath, Itching and Rash     Burning sensation (Patient reports he tolerates other NSAIDS)    Bee Venom Hives and Swelling   Lisinopril Cough   Shrimp (Diagnostic) Rash    Health Maintenance  Topic Date Due   COVID-19 Vaccine (1) Never done   Diabetic kidney evaluation - Urine ACR  02/17/2018   FOOT EXAM  11/22/2019   OPHTHALMOLOGY EXAM  07/18/2020   INFLUENZA VACCINE  05/05/2022   LIPID PANEL  06/10/2022   HEMOGLOBIN A1C  06/16/2022   Diabetic kidney evaluation - GFR measurement  08/27/2023   COLONOSCOPY (Pts 45-11yr Insurance coverage will need to be confirmed)  02/11/2031   Hepatitis C Screening  Completed   HIV Screening  Completed   HPV VACCINES  Aged Out    Objective:  Vitals:   08/31/22 1424  Pulse: 80  Resp: 16  Temp: 97.8 F (36.6 C)  TempSrc: Oral  SpO2: 96%  Weight: 285 lb (129.3 kg)  Height: _0  (1.702 m)   Body mass index is 44.64 kg/m.  Physical Exam Constitutional:      General: He is not in acute distress.    Appearance: He is normal weight. He is not toxic-appearing.  HENT:     Head: Normocephalic and atraumatic.     Right Ear: External ear normal.      Left Ear: External ear normal.     Nose: No congestion or rhinorrhea.     Mouth/Throat:     Mouth: Mucous membranes are moist.     Pharynx: Oropharynx is clear.  Eyes:     Extraocular Movements: Extraocular movements intact.     Conjunctiva/sclera: Conjunctivae normal.     Pupils: Pupils are equal, round, and reactive to light.  Cardiovascular:     Rate and Rhythm: Normal rate and regular rhythm.     Heart sounds: No murmur heard.    No friction rub. No gallop.  Pulmonary:     Effort: Pulmonary effort is normal.     Breath sounds: Normal breath sounds.  Abdominal:     General: Abdomen is flat. Bowel sounds are normal.     Palpations: Abdomen is soft.     Comments: Wound vac in place  Musculoskeletal:        General: No swelling. Normal range of motion.     Cervical back: Normal range of motion and neck supple.  Skin:    General: Skin is warm and dry.  Neurological:     General: No focal deficit present.     Mental Status: He is oriented to person, place, and time.  Psychiatric:        Mood and Affect: Mood normal.     Lab Results Lab Results  Component Value Date   WBC 7.8 08/26/2022   HGB 12.0 (L) 08/26/2022   HCT 37.5 (L) 08/26/2022   MCV 91.9 08/26/2022   PLT 282 08/26/2022    Lab Results  Component Value Date   CREATININE 1.53 (H) 08/26/2022   BUN 37 (H) 08/26/2022   NA 139 08/26/2022   K 4.6 08/26/2022   CL 105 08/26/2022   CO2 25 08/26/2022    Lab Results  Component Value Date   ALT 16 08/10/2022   AST 19 08/10/2022   ALKPHOS 41 08/10/2022   BILITOT 1.1 08/10/2022    Lab Results  Component Value Date   CHOL 174 06/10/2021   HDL 37 (L) 06/10/2021   LDLCALC 115 (H) 06/10/2021   TRIG 120 06/10/2021   CHOLHDL 4.7 06/10/2021   No results found for: "LABRPR", "RPRTITER" No results found for: "HIV1RNAQUANT", "HIV1RNAVL", "CD4TABS"   A/P 5 YM with: #LVAD exit site infection #ICD #Hx of MSSA from LVAD exit site #NICM status post HM 3 VAD   placement on 07/21/2021 -Admitted with LVAD exit site infection.  Bedside wound cultures grew MSSA.  Patient started on ceftriaxone. CT showed fluid attenuation around DL in subq fat c/w drive line infection, edema in abdominal wall. Bedside wound Cx+ staph aureus.  Taken to the OR  on 11/8, 11/14 both with negative cultures for debridement with Dr. Prescott Gum.  Per OR note on 11/8 there was tunneled infection with excision of infected tissue including fascia.  Pocket of fluid was drained.  Per OR note on 11/14 there  is debridement of tunnel, rectus sheath, no pocket of fluid noticed.  AFB and fungal cultures were added. - Called in regards to AFB smear pending from 11/14.  Blood [facility] pending notification was activated, provided my number for follow-up. Plan: - Follow AFB and fungal cultures from the OR on 11/14 - Continue cefazolin x 6 weeks EOT 12/25.  Then transition to cefadroxil 500 mg p.o. twice daily for chronic suppression -F/U with ID 3 weeks to assess wound after possible vac removal. He had appt with Dr. Blair Hailey today, possible vac removal in 2 weeks per patient.   I have personally spent 45 minutes involved in face-to-face and non-face-to-face activities for this patient on the day of the visit. Professional time spent includes the following activities: Preparing to see the patient (review of tests), Obtaining and/or reviewing separately obtained history (admission/discharge record), Performing a medically appropriate examination and/or evaluation , Ordering medications/tests/procedures, referring and communicating with other health care professionals, Documenting clinical information in the EMR, Independently interpreting results (not separately reported), Communicating results to the patient/family/caregiver, Counseling and educating the patient/family/caregiver and Care coordination (not separately reported).   Laurice Record, MD [facility] 08/31/2022, 2:28 PM

## 2022-08-31 NOTE — Progress Notes (Signed)
Pt presents to VAD clinic for wound care by himself.   Currently on Ancef 2g q 8 hours via RUA PICC with an expected end date 09/29/22. Has follow up with ID this afternoon.   Exit site care:  Existing VAC dressing removed by Dr. Prescott Gum. VAC dressing and sponge removed by Dr Prescott Gum using sterile technique. Wound bed cleansed with hypochlorous spray and allowed to dry. Skin surrounding site cleansed with CHG swab x 2 and allowed to dry. Black wound vac sponge placed in wound bed. Covered sponge with clear vac dressing. Attached to suction canister. Wound suction set at -100 per Dr Prescott Gum. Good seal achieved. No leak or blockage alarms noted.  Anchor re-applied. Exit site healed and partially incorporated, the velour is exposed 5 cm at exit site. Wound bed beefy red. Wound tunnels 3 cm. Tenderness noted with cleansing and sponge change. Scant serosanguinous drainage in canister. No foul odor or rash noted. Pt denies fever or chills. Plan for wound vac change on Monday in [facility] clinic per Dr Prescott Gum. Pt instructed to bring sponge and canister to appt. Pt has adequate dressing supplies at home.     Plan:  Return to [place] clinic in 1 week for wound vac change with Dr Prescott Gum Return to [facility] clinic 09/11/22 for hospital d/c f/u with Dr [name] Coumadin dosing per Ander Purpura PharmD  [name] RN [place] Coordinator  Office: [phone]  24/7 Pager: [phone]

## 2022-08-31 NOTE — Patient Outreach (Signed)
Transition Care Management Unsuccessful Follow-up Telephone Call  Date of discharge and from where:  08/26/22 [facility] hospital   Attempts:  1st Attempt  Reason for unsuccessful TCM follow-up call:  Left voice message  [name], BSW, [place] Medicaid Team  [phone]

## 2022-09-01 ENCOUNTER — Other Ambulatory Visit: Payer: Medicaid Other | Admitting: Obstetrics and Gynecology

## 2022-09-01 LAB — ACID FAST SMEAR (AFB, MYCOBACTERIA): Acid Fast Smear: NEGATIVE

## 2022-09-01 NOTE — Patient Outreach (Signed)
  Medicaid Managed Care   Unsuccessful Attempt Note   09/01/2022 Name: Jeffrey [name] MRN: [id number] DOB: 1976/05/10  Referred by: Teena Dunk, NP Reason for referral : High Risk Managed Medicaid (Unsuccessful telephone outreach)  An unsuccessful telephone outreach was attempted today. The patient was referred to the case management team for assistance with care management and care coordination.    Follow Up Plan: The Managed Medicaid care management team will reach out to the patient again over the next 30 business  days. and The  Patient has been provided with contact information for the Managed Medicaid care management team and has been advised to call with any health related questions or concerns.    [name] RN, BSN [place]  [facility]ator - Managed Medicaid High Risk [phone]

## 2022-09-01 NOTE — Patient Instructions (Addendum)
Hi Jeffrey [name], sorry to have missed you today-I hope you are doing okay  - as a part of your Medicaid benefit, you are eligible for care management and care coordination services at no cost or copay. I was unable to reach you by phone today but would be happy to help you with your health related needs. Please feel free to call me at [phone].  A member of the Managed Medicaid care management team will reach out to you again over the next 30 business  days.   [name] RN, BSN [place]  [facility]ator - Managed Medicaid High Risk [phone].

## 2022-09-02 ENCOUNTER — Ambulatory Visit (INDEPENDENT_AMBULATORY_CARE_PROVIDER_SITE_OTHER): Payer: Medicaid Other

## 2022-09-02 DIAGNOSIS — I4891 Unspecified atrial fibrillation: Secondary | ICD-10-CM

## 2022-09-02 LAB — CUP PACEART REMOTE DEVICE CHECK
Battery Remaining Percentage: 87 %
Date Time Interrogation Session: 20231129142500
Implantable Lead Connection Status: 753985
Implantable Lead Implant Date: 20160812
Implantable Lead Location: 753862
Implantable Lead Model: 3401
Implantable Pulse Generator Implant Date: 20210812
Pulse Gen Serial Number: 139514

## 2022-09-04 DIAGNOSIS — T827XXA Infection and inflammatory reaction due to other cardiac and vascular devices, implants and grafts, initial encounter: Secondary | ICD-10-CM | POA: Diagnosis not present

## 2022-09-05 DIAGNOSIS — T827XXA Infection and inflammatory reaction due to other cardiac and vascular devices, implants and grafts, initial encounter: Secondary | ICD-10-CM | POA: Diagnosis not present

## 2022-09-07 ENCOUNTER — Ambulatory Visit (HOSPITAL_COMMUNITY)
Admit: 2022-09-07 | Discharge: 2022-09-07 | Disposition: A | Discharge status: Home / Self Care | Payer: Medicaid Other | Source: Ambulatory Visit | Attending: Internal Medicine | Admitting: Internal Medicine

## 2022-09-07 DIAGNOSIS — Z4801 Encounter for change or removal of surgical wound dressing: Secondary | ICD-10-CM | POA: Insufficient documentation

## 2022-09-07 DIAGNOSIS — Y828 Other medical devices associated with adverse incidents: Secondary | ICD-10-CM | POA: Diagnosis not present

## 2022-09-07 DIAGNOSIS — T827XXA Infection and inflammatory reaction due to other cardiac and vascular devices, implants and grafts, initial encounter: Secondary | ICD-10-CM | POA: Diagnosis not present

## 2022-09-07 NOTE — Progress Notes (Signed)
Pt presents to VAD clinic for wound care by himself.   Currently on Ancef 2g q 8 hours via RUA PICC with an expected end date 09/29/22. Has follow up with ID this afternoon.   Exit site care:  Existing VAC dressing removed by Dr. Prescott Gum. VAC dressing and sponge removed by Dr Prescott Gum using sterile technique. Wound bed cleansed with hypochlorous spray and allowed to dry. Skin surrounding site cleansed with CHG swab x 2 and allowed to dry. Small piece of black sponge inserted into shallow tunnel with remainder sponge placed in wound bed. Covered sponge with clear vac dressing. Attached to suction canister. Wound suction set at -100 per Dr Prescott Gum. Good seal achieved. No leak or blockage alarms noted.  Anchor re-applied. Exit site healed and partially incorporated, the velour is exposed 5 cm at exit site. Wound bed beefy red. Wound tunnels 3 cm. Tenderness noted with cleansing and sponge change. Scant serosanguinous drainage in canister. No foul odor or rash noted. Pt denies fever or chills. Plan for wound vac change on Monday in [facility] clinic per Dr Prescott Gum. Pt instructed to bring sponge and canister to appt. Pt has adequate dressing supplies at home.   Plan:  Return to [facility] clinic in 1 week for wound vac change with Dr Prescott Gum Return to [facility] clinic 09/11/22 for hospital d/c f/u with Dr [name]  Bobbye Morton RN,BSN [place] Coordinator  Office: [phone]  24/7 Pager: [phone]

## 2022-09-10 ENCOUNTER — Other Ambulatory Visit (HOSPITAL_COMMUNITY): Payer: Self-pay

## 2022-09-10 DIAGNOSIS — T827XXA Infection and inflammatory reaction due to other cardiac and vascular devices, implants and grafts, initial encounter: Secondary | ICD-10-CM

## 2022-09-10 DIAGNOSIS — Z95811 Presence of heart assist device: Secondary | ICD-10-CM

## 2022-09-10 DIAGNOSIS — Z7901 Long term (current) use of anticoagulants: Secondary | ICD-10-CM

## 2022-09-11 ENCOUNTER — Encounter (HOSPITAL_COMMUNITY): Payer: Self-pay | Admitting: Internal Medicine

## 2022-09-11 ENCOUNTER — Ambulatory Visit (HOSPITAL_COMMUNITY): Payer: Self-pay | Admitting: Pharmacist

## 2022-09-11 ENCOUNTER — Ambulatory Visit (HOSPITAL_COMMUNITY)
Admission: RE | Admit: 2022-09-11 | Discharge: 2022-09-11 | Disposition: A | Discharge status: Home / Self Care | Payer: Medicaid Other | Source: Ambulatory Visit | Attending: Internal Medicine | Admitting: Internal Medicine

## 2022-09-11 ENCOUNTER — Other Ambulatory Visit: Payer: Self-pay

## 2022-09-11 ENCOUNTER — Inpatient Hospital Stay (HOSPITAL_COMMUNITY)
Admission: RE | Admit: 2022-09-11 | Discharge: 2022-09-11 | Disposition: A | Discharge status: Home / Self Care | Payer: Medicaid Other | Source: Ambulatory Visit | Attending: Internal Medicine | Admitting: Internal Medicine

## 2022-09-11 ENCOUNTER — Other Ambulatory Visit (HOSPITAL_COMMUNITY): Payer: Self-pay | Admitting: Internal Medicine

## 2022-09-11 VITALS — BP 110/0 | HR 77 | Wt 260.0 lb

## 2022-09-11 DIAGNOSIS — Z452 Encounter for adjustment and management of vascular access device: Secondary | ICD-10-CM | POA: Diagnosis not present

## 2022-09-11 DIAGNOSIS — Z905 Acquired absence of kidney: Secondary | ICD-10-CM | POA: Diagnosis not present

## 2022-09-11 DIAGNOSIS — F17200 Nicotine dependence, unspecified, uncomplicated: Secondary | ICD-10-CM | POA: Insufficient documentation

## 2022-09-11 DIAGNOSIS — F32A Depression, unspecified: Secondary | ICD-10-CM | POA: Diagnosis not present

## 2022-09-11 DIAGNOSIS — I152 Hypertension secondary to endocrine disorders: Secondary | ICD-10-CM | POA: Diagnosis not present

## 2022-09-11 DIAGNOSIS — R739 Hyperglycemia, unspecified: Secondary | ICD-10-CM

## 2022-09-11 DIAGNOSIS — L7682 Other postprocedural complications of skin and subcutaneous tissue: Secondary | ICD-10-CM | POA: Diagnosis not present

## 2022-09-11 DIAGNOSIS — I5023 Acute on chronic systolic (congestive) heart failure: Secondary | ICD-10-CM | POA: Diagnosis not present

## 2022-09-11 DIAGNOSIS — I130 Hypertensive heart and chronic kidney disease with heart failure and stage 1 through stage 4 chronic kidney disease, or unspecified chronic kidney disease: Secondary | ICD-10-CM | POA: Diagnosis not present

## 2022-09-11 DIAGNOSIS — I493 Ventricular premature depolarization: Secondary | ICD-10-CM

## 2022-09-11 DIAGNOSIS — T827XXA Infection and inflammatory reaction due to other cardiac and vascular devices, implants and grafts, initial encounter: Secondary | ICD-10-CM | POA: Diagnosis not present

## 2022-09-11 DIAGNOSIS — Z6841 Body Mass Index (BMI) 40.0 and over, adult: Secondary | ICD-10-CM | POA: Diagnosis not present

## 2022-09-11 DIAGNOSIS — Z794 Long term (current) use of insulin: Secondary | ICD-10-CM | POA: Diagnosis not present

## 2022-09-11 DIAGNOSIS — Z7901 Long term (current) use of anticoagulants: Secondary | ICD-10-CM | POA: Diagnosis not present

## 2022-09-11 DIAGNOSIS — E119 Type 2 diabetes mellitus without complications: Secondary | ICD-10-CM

## 2022-09-11 DIAGNOSIS — I5022 Chronic systolic (congestive) heart failure: Secondary | ICD-10-CM | POA: Diagnosis not present

## 2022-09-11 DIAGNOSIS — N1832 Chronic kidney disease, stage 3b: Secondary | ICD-10-CM | POA: Diagnosis not present

## 2022-09-11 DIAGNOSIS — G4733 Obstructive sleep apnea (adult) (pediatric): Secondary | ICD-10-CM | POA: Diagnosis not present

## 2022-09-11 DIAGNOSIS — E1122 Type 2 diabetes mellitus with diabetic chronic kidney disease: Secondary | ICD-10-CM | POA: Insufficient documentation

## 2022-09-11 DIAGNOSIS — R42 Dizziness and giddiness: Secondary | ICD-10-CM | POA: Insufficient documentation

## 2022-09-11 DIAGNOSIS — Z95811 Presence of heart assist device: Secondary | ICD-10-CM | POA: Diagnosis not present

## 2022-09-11 DIAGNOSIS — Z85528 Personal history of other malignant neoplasm of kidney: Secondary | ICD-10-CM | POA: Diagnosis not present

## 2022-09-11 DIAGNOSIS — I428 Other cardiomyopathies: Secondary | ICD-10-CM | POA: Insufficient documentation

## 2022-09-11 DIAGNOSIS — Z79899 Other long term (current) drug therapy: Secondary | ICD-10-CM | POA: Diagnosis not present

## 2022-09-11 DIAGNOSIS — F419 Anxiety disorder, unspecified: Secondary | ICD-10-CM | POA: Diagnosis not present

## 2022-09-11 DIAGNOSIS — Z7984 Long term (current) use of oral hypoglycemic drugs: Secondary | ICD-10-CM | POA: Insufficient documentation

## 2022-09-11 DIAGNOSIS — I480 Paroxysmal atrial fibrillation: Secondary | ICD-10-CM | POA: Insufficient documentation

## 2022-09-11 DIAGNOSIS — R002 Palpitations: Secondary | ICD-10-CM | POA: Insufficient documentation

## 2022-09-11 DIAGNOSIS — Z7985 Long-term (current) use of injectable non-insulin antidiabetic drugs: Secondary | ICD-10-CM | POA: Insufficient documentation

## 2022-09-11 DIAGNOSIS — E1159 Type 2 diabetes mellitus with other circulatory complications: Secondary | ICD-10-CM | POA: Diagnosis not present

## 2022-09-11 DIAGNOSIS — M109 Gout, unspecified: Secondary | ICD-10-CM | POA: Diagnosis not present

## 2022-09-11 LAB — CBC
HCT: 38.1 % — ABNORMAL LOW (ref 39.0–52.0)
Hemoglobin: 12.8 g/dL — ABNORMAL LOW (ref 13.0–17.0)
MCH: 29.8 pg (ref 26.0–34.0)
MCHC: 33.6 g/dL (ref 30.0–36.0)
MCV: 88.6 fL (ref 80.0–100.0)
Platelets: 213 10*3/uL (ref 150–400)
RBC: 4.3 MIL/uL (ref 4.22–5.81)
RDW: 14.7 % (ref 11.5–15.5)
WBC: 5.8 10*3/uL (ref 4.0–10.5)
nRBC: 0 % (ref 0.0–0.2)

## 2022-09-11 LAB — COMPREHENSIVE METABOLIC PANEL
ALT: 6 U/L (ref 0–44)
AST: 17 U/L (ref 15–41)
Albumin: 3.9 g/dL (ref 3.5–5.0)
Alkaline Phosphatase: 45 U/L (ref 38–126)
Anion gap: 7 (ref 5–15)
BUN: 31 mg/dL — ABNORMAL HIGH (ref 6–20)
CO2: 20 mmol/L — ABNORMAL LOW (ref 22–32)
Calcium: 10.3 mg/dL (ref 8.9–10.3)
Chloride: 111 mmol/L (ref 98–111)
Creatinine, Ser: 1.41 mg/dL — ABNORMAL HIGH (ref 0.61–1.24)
GFR, Estimated: >60 mL/min (ref >60)
Glucose, Bld: 101 mg/dL — ABNORMAL HIGH (ref 70–99)
Potassium: 4 mmol/L (ref 3.5–5.1)
Sodium: 138 mmol/L (ref 135–145)
Total Bilirubin: 0.6 mg/dL (ref 0.3–1.2)
Total Protein: 7.2 g/dL (ref 6.5–8.1)

## 2022-09-11 LAB — PROTIME-INR
INR: 2.7 — ABNORMAL HIGH (ref 0.8–1.2)
Prothrombin Time: 28.2 seconds — ABNORMAL HIGH (ref 11.4–15.2)

## 2022-09-11 LAB — LACTATE DEHYDROGENASE: LDH: 134 U/L (ref 98–192)

## 2022-09-11 MED ORDER — DOXAZOSIN MESYLATE 1 MG PO TABS
2.0000 mg | ORAL_TABLET | Freq: Every day | ORAL | 3 refills | Status: DC
  Filled 2022-09-11 – 2022-09-15 (×2): qty 60, 30d supply, fill #0

## 2022-09-11 MED ORDER — TRAMADOL HCL 50 MG PO TABS: 100.0000 mg | ORAL_TABLET | Freq: Four times a day (QID) | ORAL | Status: DC | PRN

## 2022-09-11 MED ORDER — SPIRONOLACTONE 25 MG PO TABS
25.0000 mg | ORAL_TABLET | Freq: Every day | ORAL | 6 refills | Status: DC
  Filled 2022-09-11: qty 30, 30d supply, fill #0
  Filled 2022-10-16: qty 30, 30d supply, fill #1
  Filled 2022-11-17: qty 30, 30d supply, fill #2
  Filled 2023-01-29: qty 30, 30d supply, fill #3

## 2022-09-11 MED ORDER — METFORMIN HCL 1000 MG PO TABS
1000.0000 mg | ORAL_TABLET | Freq: Two times a day (BID) | ORAL | 11 refills | Status: DC
  Filled 2022-09-11: qty 60, 30d supply, fill #0
  Filled 2022-10-16: qty 60, 30d supply, fill #1
  Filled 2022-11-17: qty 60, 30d supply, fill #2
  Filled 2023-01-29: qty 60, 30d supply, fill #3
  Filled 2023-03-10: qty 60, 30d supply, fill #4
  Filled 2023-04-23: qty 60, 30d supply, fill #5
  Filled 2023-04-28 – 2023-05-10 (×2): qty 60, 30d supply, fill #6
  Filled 2023-07-02: qty 60, 30d supply, fill #7
  Filled 2023-08-26: qty 60, 30d supply, fill #8

## 2022-09-11 MED ORDER — GABAPENTIN 400 MG PO CAPS
400.0000 mg | ORAL_CAPSULE | Freq: Three times a day (TID) | ORAL | 6 refills | Status: DC
  Filled 2022-09-11: qty 30, 10d supply, fill #0
  Filled 2022-09-25: qty 30, 10d supply, fill #1
  Filled 2022-10-16: qty 30, 10d supply, fill #2

## 2022-09-11 NOTE — Patient Instructions (Addendum)
Tramadol '100mg'$  every 6 hours as needed added for surgical site pain ZIO patch added for 14 days. Return via mail 09/26/22. Return to clinic 09/14/22 for wound vac dressing change with Dr. Darcey Nora Return to clinic 10/19/21 for 1 month follow up

## 2022-09-11 NOTE — Progress Notes (Signed)
Patient presents for hospital follow up in [facility] today alone. Reports no problem with VAD or equipment.  Pt recently admitted for drive line infection requiring wound debridement with Dr.Vantrigt and wound vac placement. Current plan is to continue cefazolin, 6 weeks from OR(11/14) , EOT 12/25 then transition to cefadroxil '500mg'$  PO bid for chronic suppression. Pt followed closely in [facility] for weekly wound vac dressing changes with Dr.Vantrigt on Monday's.  Pt states he is overall doing well since discharge. Denies falls, shortness of breath, heart failure symptoms or signs of bleeding. States he is continuing to stay active by walking daily and would like to switch back to a vegan diet. Pt educated by VAD Coordinator and Dr. [name] that it fine to switch back but to let [facility] know so that we can follow INR more closely with the increase in a Vitamin K rich diet and to be consistent with serving of vegetables throughout the week.  He has felt fatigued over the last few days and attributes this to a cold that he may have gotten from his daughter and wife. He endorses that he has had ongoing dizziness and nausea at night and an episode of palpitations in his chest about two weeks ago. He took a PRN dose of Meclizine last night prior to bed that helped with ongoing dizziness. He states this occurs daily around the time of his last IV antibiotic dose administration. Dr. [name] made aware orders received for ZIO patch placement for 14 days.  Pt states he continues to have pain at wound vac site. Orders received for Tramadol '100mg'$  every 6 hours for severe pain.   Vital Signs:  Doppler Pressure: 110 Automatc BP:  120/100 (106); pt has not taken medication this morning HR:  77 SPO2: 99% on RA   Weight: 296.0 lb w/ eqt Last weight:  297.8 lbs w/eqt   VAD Indication: Destination Therapy due to smoking and BMI   VAD interrogation & Equipment Management: Speed: 6200 Flow: 5.0 Power:  5.1w    PI: 2.9 Hct: 38   Alarms: no clinical alarms Events: 10-30 daily  Fixed speed 6200 Low speed limit: 5900   Primary Controller:  Replace back up battery in 16 months. Back up controller:   Replace back up battery in 22 months.   Annual Equipment Maintenance on UBC/PM was performed on 07/21/21.    I reviewed the LVAD parameters from today and compared the results to the patient's prior recorded data. LVAD interrogation was NEGATIVE for significant power changes, NEGATIVE for clinical alarms and POSITIVE for PI events/speed drops. No programming changes were made and pump is functioning within specified parameters. Pt is performing daily controller and system monitor self tests along with completing weekly and monthly maintenance for LVAD equipment.   LVAD equipment check completed and is in good working order. Back-up equipment present.    Exit Site Care: Drive line being maintained by VAD Coordinators and Dr. Darcey Nora through weekly wound vac dressing changes. Pt states he has not had any issues with the wound vac. Dressing CDI with good seal and suction set at -100. Anchor secure at site.    Device:  Left subcutaneous dual ICD Therapies: on at 250 BPM Last check: 03/04/22   BP & Labs:  Doppler 106 - Doppler is reflecting MAP. Pt has not taken medications today.   Hgb 14.3 - No S/S of bleeding. Specifically denies melena/BRBPR or nosebleeds.   LDH stable at 148 with established baseline of 160 -  300. Denies tea-colored urine. No power elevations noted on interrogation.    Plan:  Tramadol '100mg'$  every 6 hours as needed added for surgical site pain ZIO patch added for 14 days. Return via mail 09/26/22. Return to clinic 09/14/22 for wound vac dressing change with Dr. Darcey Nora Return to clinic 10/19/21 for 1 month follow up   Bobbye Morton RN,BSN [place] Coordinator  Office: [phone]  24/7 Pager: [phone]

## 2022-09-12 DIAGNOSIS — T827XXA Infection and inflammatory reaction due to other cardiac and vascular devices, implants and grafts, initial encounter: Secondary | ICD-10-CM | POA: Diagnosis not present

## 2022-09-13 NOTE — Progress Notes (Addendum)
VAD CLINIC Follow-up Visit  PCP: Teena Dunk, NP HF MD: DB   HPI:  Jeffrey [name] is a 46 y.o. male with a history of  poorly controlled HTN, R renal cell carcinoma s/p nephrectomy, CKD IIIa, DM2, OSA, gout, morbid obesity and systolic HF due to NICM. 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 D/c'd home with plan for 6 weeks IV Ancef with plan to transition to cefadroxil for long-term suppression on 09/28/22.   Has been having weekly wound vac changes with Dr. Prescott Gum   Follow up for Heart Failure/LVAD:  Here for routine VAD f/u. Continues on IV abx. Doing well. No fevers or chills. Wound vac working well. Stil with some pain at wound vac site. No drainage. Has intermittent dizziness and palpitations. Denies orthopnea or PND.  No bleeding, melena or neuro symptoms. No VAD alarms. Taking all meds as prescribed.    VAD Indication: Destination Therapy due to smoking and BMI   VAD interrogation & Equipment Management: Speed: 6200 Flow: 5.0 Power: 5.1w    PI: 2.9 Hct: 38   Alarms: no clinical alarms Events: 10-30 daily  Fixed speed 6200 Low speed limit: 5900   Primary Controller:  Replace back up battery in 16 months. Back up controller:   Replace back up battery in 22 months.   Annual Equipment Maintenance on UBC/PM was performed on 07/21/21.    I reviewed the LVAD parameters from today and compared the results to the patient's prior recorded data. LVAD interrogation was NEGATIVE for significant power changes, NEGATIVE for clinical alarms and POSITIVE for PI events/speed drops. No programming changes were made and pump is functioning within specified parameters. Pt is performing daily controller and system monitor self tests along with completing weekly and monthly maintenance for LVAD equipment.   LVAD equipment check completed and is in good working order. Back-up equipment present.    Past Medical History:  Diagnosis Date   Anxiety    Aspirin allergy    Childhood asthma    Chronic systolic CHF (congestive heart failure) ([place])    a. EF 20-25% in 2012. b. EF 45-50% in 10/2011 with nonischemic nuc - presumed NICM. c. 12/2014 Echo: Sev depressed LV fxn, sev dil LV, mild LVH, mild MR, sev dil LA, mildly reduced RV fxn.   CKD (chronic kidney disease) stage 2, GFR 60-89 ml/min    H/O vasectomy 12/2019   High cholesterol    Hypertension    Morbid obesity ([place])    Nephrolithiasis    OSA on CPAP    Paroxysmal atrial fibrillation ([place])    Presumed NICM    a. 04/2014 Myoview: EF 26%, glob HK, sev glob HK, ? prior infarct;  b. Never cathed 2/2 CKD.   Renal cell carcinoma ([place])    a. s/p Rt robotic assisted partial converted to radical nephrectomy on 01/2013.   Troponin level elevated    a. 04/2014, 12/2014: felt due to CHF.   Type II diabetes mellitus (HCC)    Ventricular tachycardia (HCC)    a. appropriate ICD therapy 12/2017    Current Outpatient Medications  Medication Sig Dispense Refill   Accu-Chek FastClix Lancets MISC USE AS DIRECTED FOUR TIMES DAILY 102 each 11   acetaminophen (TYLENOL) 325 MG tablet Take 2 tablets (650 mg total) by mouth every 6 (six) hours as needed for fever.     diclofenac Sodium (VOLTAREN) 1 %  GEL Apply 2 g topically 4 (four) times daily. (Patient taking differently: Apply 2 g topically 4 (four) times daily as needed (pain).) 100 g 1   doxazosin (CARDURA) 1 MG tablet Take 1 tablet (1 mg total) by mouth daily. 30 tablet 6   glucose blood test strip Use as directed 4 times daily. 100 each 11   insulin aspart protamine - aspart (NOVOLOG MIX 70/30 FLEXPEN) (70-30) 100 UNIT/ML FlexPen Inject 38 Units into the skin 2 (two) times daily. (Patient taking differently: Inject 34 Units into the skin 2 (two) times daily.) 15 mL 11   Insulin Pen Needle (TRUEPLUS 5-BEVEL PEN NEEDLES) 29G X 12.7MM MISC Use as directed in the morning, at noon, and at bedtime. 100  each 3   Insulin Syringe-Needle U-100 (TRUEPLUS INSULIN SYRINGE) 31G X 5/16" 0.5 ML MISC USE TO INJECT INSULIN TWICE DAILY. 100 each 11   meclizine (ANTIVERT) 25 MG tablet Take 1 tablet (25 mg total) by mouth 3 (three) times daily as needed for dizziness. 90 tablet 2   Semaglutide, 2 MG/DOSE, (OZEMPIC, 2 MG/DOSE,) 8 MG/3ML SOPN Inject 2 mg into the skin once a week. 3 mL 11   allopurinol (ZYLOPRIM) 100 MG tablet Take 1 tablet (100 mg total) by mouth daily. 90 tablet 3   amiodarone (PACERONE) 200 MG tablet Take 1 tablet (200 mg total) by mouth daily. 30 tablet 6   amLODipine (NORVASC) 10 MG tablet Take 1 tablet (10 mg total) by mouth daily 30 tablet 6   colchicine 0.6 MG tablet Take 1 tablet (0.6 mg total) by mouth once daily. 30 tablet 6   docusate sodium (COLACE) 100 MG capsule Take 1 capsule (100 mg total) by mouth 2 (two) times daily. (Patient not taking: Reported on 08/05/2022) 10 capsule 0   enoxaparin (LOVENOX) 60 MG/0.6ML injection Inject 0.6 mLs (60 mg total) into the skin every 12 (twelve) hours. 12 mL 0   gabapentin (NEURONTIN) 300 MG capsule Take 1 capsule (300 mg total) by mouth 3 (three) times daily. 90 capsule 6   isosorbide-hydrALAZINE (BIDIL) 20-37.5 MG tablet Take 2 tablets by mouth 3 (three) times daily. 180 tablet 6   metFORMIN (GLUCOPHAGE) 1000 MG tablet Take 1 tablet (1,000 mg total) by mouth 2 (two) times daily with a meal. 60 tablet 11   pantoprazole (PROTONIX) 40 MG tablet Take 1 tablet (40 mg total) by mouth once daily. 90 tablet 3   sacubitril-valsartan (ENTRESTO) 97-103 MG Take 1 tablet by mouth 2 (two) times daily. 60 tablet 6   sertraline (ZOLOFT) 100 MG tablet Take 1.5 tablets (150 mg total) by mouth daily. 45 tablet 6   spironolactone (ALDACTONE) 25 MG tablet Take 1 tablet (25 mg total) by mouth daily. 30 tablet 6   torsemide (DEMADEX) 20 MG tablet Take 1 tablet (20 mg total) by mouth daily as needed (If wt gain > 3 lb in 24 hr or > 5 lb in 1 week). (Patient not taking:  Reported on 08/05/2022) 30 tablet 2   traMADol (ULTRAM) 50 MG tablet Take 1-2 tablets (50-100 mg total) by mouth 2 (two) times daily. (Patient not taking: Reported on 08/05/2022) 60 tablet 5   traZODone (DESYREL) 100 MG tablet Take 1 tablet (100 mg total) by mouth at bedtime. 30 tablet 11   warfarin (COUMADIN) 4 MG tablet Take 1 tablet (4 mg) every Tuesday and 2 tablets (8 mg) all other days or as directed by HF clinic 90 tablet 11   No current facility-administered medications  for this encounter.    Aspirin, Bee venom, Lisinopril, Shrimp (diagnostic), and Tomato  REVIEW OF SYSTEMS: All systems negative except as listed in HPI, PMH and Problem list.   Vitals:   08/05/22 1100 08/05/22 1123 08/05/22 1124  BP:  111/88 (!) 124/0  Pulse:  77   SpO2:  98%   Weight: 135.1 kg (297 lb 12.8 oz)    Height: '5\' 7"'$  (1.702 m)     Vital Signs:  Doppler Pressure: 110 Automatc BP:  120/100 (106); pt has not taken medication this morning HR:  77 SPO2: 99% on RA   Weight: 296.0 lb w/ eqt Last weight:  297.8 lbs w/eqt     Exam: General:  NAD.  HEENT: normal  Neck: supple. JVP not elevated.  Carotids 2+ bilat; no bruits. No lymphadenopathy or thryomegaly appreciated. Cor: LVAD hum.  Lungs: Clear. Abdomen: obese soft, nontender, non-distended. No hepatosplenomegaly. No bruits or masses. Good bowel sounds. Driveline site with wound vac in place. Anchor in place.  Extremities: no cyanosis, clubbing, rash. Warm no edema  Neuro: alert & oriented x 3. No focal deficits. Moves all 4 without problem     ASSESSMENT AND PLAN:  .1. Chronic systolic CHF - Nonischemic CM, end-stage - EF 2012; 20-25%  - Echo  8/22: EF < 20%, severe LV dilation, moderate RV dysfunction.   - RHC (8/22): well compensated hemodynamics. - s/p S-ICD - S/p HM3 LVAD 10/17 (DT) - Stable NYHA I-II. Volume status ok  - MAPs high today in setting of not taking morning meds. Continue to follow - Continue Bidil 2 tabs tid -  Continue spironolactone 25 mg daily  - Continue entresto to 97/130 bid - Stressed need to continue weight loss efforts (see below). Continue Ozempic. Once BMI closer to 35 (and DL infection cleared) will refer to [facility] for begin transplant evaluation (Body mass index is 46.64 kg/m.)   2. VAD  - s/p HM-3 placement 07/21/21  - VAD interrogated personally. Parameters stable. - INR 2.7 Goal INR 2.0-3.0 Discussed dosing with PharmD personally. - LDH 134 - DL site as below  - Hgb 12.8   3. DL site infection - Wound vac in place.  - Continue 6 weeks IV Ancef with plan to transition to cefadroxil for long-term suppression on 09/28/22.  - Tramadol for pain  4 Atrial fibrillation: Paroxysmal - Had severe AF with RVR 7/22 s/p DC-CV x 2 - Seen by EP. Not a candidate for ablation due to body habitus  (could consider AVN ablation and CRT, if needed)  - Had post-op AF with RVR post VAD - 10/23 S/P DC-CV with restoration of NSR. - Remains in NSR Continue amio 100 daily - On warfarin. INR 2.7 Management as above   5. HTN - Blood pressure up today in setting of not taking am meds. Follow   6. CKD stage 3B  - Has solitary kidney.   - Baseline creatinine is 1.5-1.8.  - Creatinine 1.41 today   7. OSA - Continue CPAP   8. DM2  - On Jardiance. - Hgb A1c 9.4. -> 5.9 - PCP following  9. Polyarticular gout. - quiescent  10. Depression/anxiety - Continue Zoloft '150mg'$  daily - Much improved  11. Morbid obesity - Body mass index is 46.64 kg/m. - Weight coming down - Continue GLP1RA  12.  Palpitations/dizziness - check Zio to assess for PAF/AFL  Total time spent 35 minutes. Over half that time spent discussing above.     Glori Bickers, MD  5:49 PM

## 2022-09-14 ENCOUNTER — Other Ambulatory Visit: Payer: Self-pay

## 2022-09-14 ENCOUNTER — Ambulatory Visit (HOSPITAL_COMMUNITY)
Admission: RE | Admit: 2022-09-14 | Discharge: 2022-09-14 | Disposition: A | Discharge status: Home / Self Care | Payer: Medicaid Other | Source: Ambulatory Visit | Attending: Cardiology | Admitting: Cardiology

## 2022-09-14 DIAGNOSIS — Z4801 Encounter for change or removal of surgical wound dressing: Secondary | ICD-10-CM | POA: Insufficient documentation

## 2022-09-14 DIAGNOSIS — X58XXXA Exposure to other specified factors, initial encounter: Secondary | ICD-10-CM | POA: Insufficient documentation

## 2022-09-14 DIAGNOSIS — T827XXA Infection and inflammatory reaction due to other cardiac and vascular devices, implants and grafts, initial encounter: Secondary | ICD-10-CM | POA: Insufficient documentation

## 2022-09-14 MED ORDER — TRAMADOL HCL 100 MG PO TABS
100.0000 mg | ORAL_TABLET | Freq: Four times a day (QID) | ORAL | 0 refills | Status: DC | PRN
  Filled 2022-09-14: qty 90, 23d supply, fill #0

## 2022-09-14 NOTE — Progress Notes (Signed)
Pt presents to VAD clinic for wound care by himself.   Currently on Ancef 2g q 8 hours via RUA PICC with an expected end date 09/29/22.   Per ID on 08/31/22: Continue cefazolin x 6 weeks EOT 12/25. Then transition to cefadroxil 500 mg p.o. twice daily for chronic suppression.    Exit site care:  Existing VAC dressing removed by Dr. Prescott Gum. VAC dressing and sponge removed by Dr Prescott Gum using sterile technique. Wound bed cleansed with hypochlorous spray and allowed to dry. Skin surrounding site cleansed with CHG swab x 2 and allowed to dry. Wound measures 15.9 x 4.5 x 4.5. Small piece of black sponge inserted into shallow tunnel with remainder sponge placed in wound bed. Covered sponge with clear vac dressing. Attached to suction canister. Wound suction set at -100 per Dr Prescott Gum. Good seal achieved. No leak or blockage alarms noted.  Anchor re-applied. Exit site healed and partially incorporated, the velour is exposed 5 cm at exit site. Wound bed beefy red. Wound tunnels 3 cm. Tenderness noted with cleansing and sponge change. Large amount of serosanguinous drainage -  pt changed canister yesterday.  No foul odor or rash noted. Pt denies fever or chills. Plan for wound vac change on Monday in [place] clinic per Dr Prescott Gum. Pt instructed to bring sponge and canister to appt. Pt has adequate dressing supplies at home.     Plan:  Return to [place] clinic in 1 week for wound vac change with Dr Lucianne Lei Deberah Castle RN,BSN [place] Coordinator  Office: [phone]  24/7 Pager: [phone]

## 2022-09-15 ENCOUNTER — Other Ambulatory Visit: Payer: Self-pay

## 2022-09-16 ENCOUNTER — Encounter: Payer: Self-pay | Admitting: Nurse Practitioner

## 2022-09-16 ENCOUNTER — Ambulatory Visit: Payer: Medicaid Other | Admitting: Nurse Practitioner

## 2022-09-16 ENCOUNTER — Other Ambulatory Visit: Payer: Self-pay

## 2022-09-16 VITALS — BP 103/76 | HR 81 | Temp 98.2°F | Ht 67.0 in | Wt 292.0 lb

## 2022-09-16 DIAGNOSIS — N1831 Chronic kidney disease, stage 3a: Secondary | ICD-10-CM | POA: Diagnosis not present

## 2022-09-16 DIAGNOSIS — Z794 Long term (current) use of insulin: Secondary | ICD-10-CM | POA: Diagnosis not present

## 2022-09-16 DIAGNOSIS — E1122 Type 2 diabetes mellitus with diabetic chronic kidney disease: Secondary | ICD-10-CM

## 2022-09-16 DIAGNOSIS — T827XXA Infection and inflammatory reaction due to other cardiac and vascular devices, implants and grafts, initial encounter: Secondary | ICD-10-CM | POA: Diagnosis not present

## 2022-09-16 LAB — POCT GLYCOSYLATED HEMOGLOBIN (HGB A1C): Hemoglobin A1C: 5.3 % (ref 4.0–5.6)

## 2022-09-16 NOTE — Patient Instructions (Addendum)
1. Type 2 diabetes mellitus with stage 3a chronic kidney disease, with long-term current use of insulin ([place])  Continue current medications  Follow up:  Follow up in 3 months or sooner if needed

## 2022-09-16 NOTE — Addendum Note (Signed)
Addended by: Elta Guadeloupe on: 09/16/2022 03:00 PM   Modules accepted: Orders

## 2022-09-16 NOTE — Progress Notes (Signed)
_0  ID: [name], male    DOB: 07/20/1976, 46 y.o.   MRN: [id number]  Chief Complaint  Patient presents with   Follow-up    6 months--    Referring provider: Teena Dunk, NP   HPI  [name] 46 y.o. male  has a past medical history of Anxiety, Aspirin allergy, Childhood asthma, Chronic systolic CHF (congestive heart failure) ([place]), CKD (chronic kidney disease) stage 2, GFR 60-89 ml/min, H/O vasectomy (12/2019), High cholesterol, Hypertension, Morbid obesity ([place]), Nephrolithiasis, OSA on CPAP, Paroxysmal atrial fibrillation ([place]), Presumed NICM, Renal cell carcinoma ([place]), Troponin level elevated, Type II diabetes mellitus ([place]), and Ventricular tachycardia ([place]). To the [facility] for reevaluation of chronic illness.   Diabetes Mellitus: Patient presents for follow up of diabetes. Symptoms: none. Denies any symptoms. Patient denies foot ulcerations, hypoglycemia , and nausea.  Evaluation to date has been included: hemoglobin A1C.  Home sugars: patient does not check sugars. Treatment to date: no recent interventions.    Hypertension: Patient here for follow-up of elevated blood pressure. Patient is trying to loose weight for possible heart transplant. Cardiac symptoms none. Patient denies claudication, dyspnea, fatigue, and near-syncope.  Cardiovascular risk factors: hypertension, male gender, and obesity (BMI >= 30 kg/m2). Use of agents associated with hypertension: none.    Denies any other concerns today. Denies any fatigue, chest pain, shortness of breath, HA or dizziness. Denies any blurred vision, numbness or tingling.        Allergies  Allergen Reactions   Aspirin Shortness Of Breath, Itching and Rash     Burning sensation (Patient reports he tolerates other NSAIDS)    Bee Venom Hives and Swelling   Lisinopril Cough   Shrimp (Diagnostic) Rash    Immunization History  Administered Date(s) Administered   Influenza Split 10/24/2011   Influenza,inj,Quad PF,6+  Mos 07/23/2017, 09/06/2018, 09/18/2019   Pneumococcal Conjugate-13 11/01/2017   Pneumococcal Polysaccharide-23 10/24/2011   Tdap 04/07/2015    Past Medical History:  Diagnosis Date   Anxiety    Aspirin allergy    Childhood asthma    Chronic systolic CHF (congestive heart failure) ([place])    a. EF 20-25% in 2012. b. EF 45-50% in 10/2011 with nonischemic nuc - presumed NICM. c. 12/2014 Echo: Sev depressed LV fxn, sev dil LV, mild LVH, mild MR, sev dil LA, mildly reduced RV fxn.   CKD (chronic kidney disease) stage 2, GFR 60-89 ml/min    H/O vasectomy 12/2019   High cholesterol    Hypertension    Morbid obesity ([place])    Nephrolithiasis    OSA on CPAP    Paroxysmal atrial fibrillation ([place])    Presumed NICM    a. 04/2014 Myoview: EF 26%, glob HK, sev glob HK, ? prior infarct;  b. Never cathed 2/2 CKD.   Renal cell carcinoma ([place])    a. s/p Rt robotic assisted partial converted to radical nephrectomy on 01/2013.   Troponin level elevated    a. 04/2014, 12/2014: felt due to CHF.   Type II diabetes mellitus (HCC)    Ventricular tachycardia (HCC)    a. appropriate ICD therapy 12/2017    Tobacco History: Social History   Tobacco Use  Smoking Status Former   Packs/day: 0.25   Years: 22.00   Total pack years: 5.50   Types: Cigarettes   Quit date: 04/30/2015   Years since quitting: 7.3  Smokeless Tobacco Never   Counseling given: Not Answered   Outpatient Encounter Medications as of 09/16/2022  Medication Sig   Accu-Chek FastClix Lancets MISC USE AS DIRECTED FOUR TIMES DAILY   acetaminophen (TYLENOL) 325 MG tablet Take 2 tablets (650 mg total) by mouth every 6 (six) hours as needed for fever.   allopurinol (ZYLOPRIM) 100 MG tablet Take 1 tablet (100 mg total) by mouth daily.   amiodarone (PACERONE) 200 MG tablet Take 1 tablet (200 mg total) by mouth daily.   amLODipine (NORVASC) 10 MG tablet Take 1 tablet (10 mg total) by mouth daily   ceFAZolin (ANCEF) IVPB Inject 2 g into the vein  every 8 (eight) hours. Indication:  LVAD deep driveline infection First Dose: Yes Last Day of Therapy:  09/28/2022 Labs - Once weekly:  CBC/D and BMP, Labs - Every other week:  ESR and CRP Method of administration: IV Push Method of administration may be changed at the discretion of home infusion pharmacist based upon assessment of the patient and/or caregiver's ability to self-administer the medication ordered. Please pull PICC at the end of therapy   colchicine 0.6 MG tablet Take 1 tablet (0.6 mg total) by mouth once daily.   diclofenac Sodium (VOLTAREN) 1 % GEL Apply 2 g topically 4 (four) times daily. (Patient taking differently: Apply 2 g topically 4 (four) times daily as needed (pain).)   docusate sodium (COLACE) 100 MG capsule Take 1 capsule (100 mg total) by mouth 2 (two) times daily.   doxazosin (CARDURA) 1 MG tablet Take 2 tablets (2 mg total) by mouth daily.   gabapentin (NEURONTIN) 400 MG capsule Take 1 capsule (400 mg total) by mouth 3 (three) times daily.   glucose blood test strip Use as directed 4 times daily.   HYDROcodone-acetaminophen (NORCO) 10-325 MG tablet Take 1 tablet by mouth every 6 (six) hours as needed for severe pain.   Insulin Pen Needle (TRUEPLUS 5-BEVEL PEN NEEDLES) 29G X 12.7MM MISC Use as directed in the morning, at noon, and at bedtime.   Insulin Syringe-Needle U-100 (ULTICARE INSULIN SYRINGE) 30G X 1/2" 1 ML MISC USE TO INJECT INSULIN TWICE DAILY.   isosorbide-hydrALAZINE (BIDIL) 20-37.5 MG tablet Take 2 tablets by mouth 3 (three) times daily.   meclizine (ANTIVERT) 25 MG tablet Take 1 tablet (25 mg total) by mouth 3 (three) times daily as needed for dizziness.   metFORMIN (GLUCOPHAGE) 1000 MG tablet Take 1 tablet (1,000 mg total) by mouth 2 (two) times daily with a meal.   pantoprazole (PROTONIX) 40 MG tablet Take 1 tablet (40 mg total) by mouth once daily.   sacubitril-valsartan (ENTRESTO) 97-103 MG Take 1 tablet by mouth 2 (two) times daily.   Semaglutide,  2 MG/DOSE, (OZEMPIC, 2 MG/DOSE,) 8 MG/3ML SOPN Inject 2 mg into the skin once a week.   sertraline (ZOLOFT) 100 MG tablet Take 1.5 tablets (150 mg total) by mouth daily.   spironolactone (ALDACTONE) 25 MG tablet Take 1 tablet (25 mg total) by mouth daily.   torsemide (DEMADEX) 20 MG tablet Take 1 tablet (20 mg total) by mouth daily as needed (If wt gain > 3 lb in 24 hr or > 5 lb in 1 week).   traMADol HCl 100 MG TABS Take 1 tablet (100 mg) by mouth every 6 (six) hours as needed for pain   traZODone (DESYREL) 100 MG tablet Take 1 tablet (100 mg total) by mouth at bedtime.   warfarin (COUMADIN) 4 MG tablet Take 2 tablet (8 mg) daily or as directed by HF clinic   Zinc Sulfate 220 (50 Zn) MG TABS Take 1  tablet (220 mg total) by mouth daily.   No facility-administered encounter medications on file as of 09/16/2022.     Review of Systems  Review of Systems  Constitutional: Negative.   HENT: Negative.    Cardiovascular: Negative.   Gastrointestinal: Negative.   Allergic/Immunologic: Negative.   Neurological: Negative.   Psychiatric/Behavioral: Negative.         Physical Exam  BP 103/76   Pulse 81   Temp 98.2 F (36.8 C)   Ht _0  (1.702 m)   Wt 292 lb (132.5 kg)   SpO2 97%   BMI 45.73 kg/m   Wt Readings from Last 5 Encounters:  09/16/22 292 lb (132.5 kg)  09/11/22 260 lb (117.9 kg)  08/31/22 285 lb (129.3 kg)  08/26/22 287 lb 0.6 oz (130.2 kg)  08/05/22 297 lb 12.8 oz (135.1 kg)     Physical Exam Vitals and nursing note reviewed.  Constitutional:      General: He is not in acute distress.    Appearance: He is well-developed.  Cardiovascular:     Rate and Rhythm: Normal rate and regular rhythm.  Pulmonary:     Effort: Pulmonary effort is normal.     Breath sounds: Normal breath sounds.  Skin:    General: Skin is warm and dry.  Neurological:     Mental Status: He is alert and oriented to person, place, and time.       Assessment & Plan:   Type 2 diabetes  mellitus with stage 3 chronic kidney disease, with long-term current use of insulin (HCC) Continue current medications  Follow up:  Follow up in 3 months or sooner if needed     Fenton Foy, NP 09/16/2022

## 2022-09-16 NOTE — Assessment & Plan Note (Signed)
Continue current medications  Follow up:  Follow up in 3 months or sooner if needed

## 2022-09-17 LAB — FUNGAL ORGANISM REFLEX

## 2022-09-17 LAB — FUNGUS CULTURE WITH STAIN

## 2022-09-17 LAB — FUNGUS CULTURE RESULT

## 2022-09-17 NOTE — Addendum Note (Signed)
Encounter addended by: Christinia Gully, RN on: 09/17/2022 4:07 PM  Actions taken: Clinical Note Signed

## 2022-09-18 ENCOUNTER — Other Ambulatory Visit (HOSPITAL_COMMUNITY): Payer: Self-pay | Admitting: *Deleted

## 2022-09-18 DIAGNOSIS — T8189XA Other complications of procedures, not elsewhere classified, initial encounter: Secondary | ICD-10-CM | POA: Diagnosis not present

## 2022-09-18 DIAGNOSIS — T827XXA Infection and inflammatory reaction due to other cardiac and vascular devices, implants and grafts, initial encounter: Secondary | ICD-10-CM | POA: Diagnosis not present

## 2022-09-18 DIAGNOSIS — Z95811 Presence of heart assist device: Secondary | ICD-10-CM

## 2022-09-18 DIAGNOSIS — I152 Hypertension secondary to endocrine disorders: Secondary | ICD-10-CM

## 2022-09-18 DIAGNOSIS — Z7901 Long term (current) use of anticoagulants: Secondary | ICD-10-CM

## 2022-09-18 LAB — AEROBIC CULTURE W GRAM STAIN (SUPERFICIAL SPECIMEN): Gram Stain: NONE SEEN

## 2022-09-21 ENCOUNTER — Ambulatory Visit (HOSPITAL_COMMUNITY): Payer: Self-pay | Admitting: Pharmacist

## 2022-09-21 ENCOUNTER — Other Ambulatory Visit: Payer: Self-pay

## 2022-09-21 ENCOUNTER — Other Ambulatory Visit (HOSPITAL_COMMUNITY): Payer: Self-pay

## 2022-09-21 ENCOUNTER — Ambulatory Visit (HOSPITAL_COMMUNITY)
Admission: RE | Admit: 2022-09-21 | Discharge: 2022-09-21 | Disposition: A | Discharge status: Home / Self Care | Payer: Medicaid Other | Source: Ambulatory Visit | Attending: Cardiology | Admitting: Cardiology

## 2022-09-21 DIAGNOSIS — Z4801 Encounter for change or removal of surgical wound dressing: Secondary | ICD-10-CM | POA: Diagnosis not present

## 2022-09-21 DIAGNOSIS — Z95811 Presence of heart assist device: Secondary | ICD-10-CM

## 2022-09-21 DIAGNOSIS — Z7901 Long term (current) use of anticoagulants: Secondary | ICD-10-CM | POA: Diagnosis not present

## 2022-09-21 DIAGNOSIS — E1159 Type 2 diabetes mellitus with other circulatory complications: Secondary | ICD-10-CM

## 2022-09-21 DIAGNOSIS — I5022 Chronic systolic (congestive) heart failure: Secondary | ICD-10-CM

## 2022-09-21 DIAGNOSIS — T827XXA Infection and inflammatory reaction due to other cardiac and vascular devices, implants and grafts, initial encounter: Secondary | ICD-10-CM | POA: Diagnosis not present

## 2022-09-21 LAB — PROTIME-INR
INR: 3.2 — ABNORMAL HIGH (ref 0.8–1.2)
Prothrombin Time: 32.3 seconds — ABNORMAL HIGH (ref 11.4–15.2)

## 2022-09-21 MED ORDER — DOXAZOSIN MESYLATE 1 MG PO TABS
2.0000 mg | ORAL_TABLET | Freq: Every day | ORAL | 3 refills | Status: DC
  Filled 2022-09-21: qty 60, 30d supply, fill #0
  Filled 2022-10-26: qty 60, 30d supply, fill #1
  Filled 2022-12-11: qty 60, 30d supply, fill #2
  Filled 2023-01-29: qty 60, 30d supply, fill #3

## 2022-09-21 NOTE — Progress Notes (Signed)
Pt presents to VAD clinic for wound care by himself.   Currently on Ancef 2g q 8 hours via RUA PICC with an expected end date 09/29/22.   Per ID on 08/31/22: Continue cefazolin x 6 weeks EOT 12/25. Then transition to cefadroxil 500 mg p.o. twice daily for chronic suppression.    Exit site care:  Existing VAC dressing removed followed by VAC dressing and sponge removed using sterile technique. Small black sponge removed from shallow tunnel. Wound bed cleansed with hypochlorous spray and allowed to dry. Skin surrounding site cleansed with CHG swab x 2 and allowed to dry. Wound measures 15.9 x 4 x 2.5. Covered sponge with clear vac dressing. Attached to suction canister. Wound suction set at -100. Good seal achieved. No leak or blockage alarms noted.  Anchor re-applied. Exit site healed and partially incorporated, the velour is exposed 5 cm at exit site. Wound bed beefy red. Wound tunnels 2.5 cm. Tenderness noted with cleansing and sponge change. Large amount of serosanguinous drainage -  pt changed canister yesterday.  No foul odor or rash noted. Pt denies fever or chills. Plan for wound vac change on Tuesday in [place] clinic per Dr Prescott Gum. Pt instructed to bring sponge and canister to appt. Pt has adequate dressing supplies at home.     Plan:  Return to [place] clinic in 1 week for wound vac change with Dr Lucianne Lei Lytle Michaels RN,BSN [place] Coordinator  Office: [phone]  24/7 Pager: [phone]

## 2022-09-22 ENCOUNTER — Ambulatory Visit: Payer: Medicaid Other | Admitting: Internal Medicine

## 2022-09-24 ENCOUNTER — Other Ambulatory Visit (HOSPITAL_COMMUNITY): Payer: Self-pay

## 2022-09-24 DIAGNOSIS — Z95811 Presence of heart assist device: Secondary | ICD-10-CM

## 2022-09-24 DIAGNOSIS — Z7901 Long term (current) use of anticoagulants: Secondary | ICD-10-CM

## 2022-09-25 ENCOUNTER — Other Ambulatory Visit: Payer: Self-pay

## 2022-09-25 DIAGNOSIS — T827XXA Infection and inflammatory reaction due to other cardiac and vascular devices, implants and grafts, initial encounter: Secondary | ICD-10-CM | POA: Diagnosis not present

## 2022-09-25 NOTE — Progress Notes (Signed)
Remote ICD transmission.   

## 2022-09-29 ENCOUNTER — Encounter: Payer: Self-pay | Admitting: Infectious Diseases

## 2022-09-29 ENCOUNTER — Ambulatory Visit (HOSPITAL_COMMUNITY)
Admission: RE | Admit: 2022-09-29 | Discharge: 2022-09-29 | Disposition: A | Discharge status: Home / Self Care | Payer: Medicaid Other | Source: Ambulatory Visit | Attending: Internal Medicine | Admitting: Internal Medicine

## 2022-09-29 ENCOUNTER — Ambulatory Visit (HOSPITAL_COMMUNITY): Payer: Self-pay | Admitting: Pharmacist

## 2022-09-29 ENCOUNTER — Other Ambulatory Visit: Payer: Self-pay

## 2022-09-29 DIAGNOSIS — Z4801 Encounter for change or removal of surgical wound dressing: Secondary | ICD-10-CM | POA: Insufficient documentation

## 2022-09-29 DIAGNOSIS — Z95811 Presence of heart assist device: Secondary | ICD-10-CM | POA: Insufficient documentation

## 2022-09-29 DIAGNOSIS — Z7901 Long term (current) use of anticoagulants: Secondary | ICD-10-CM | POA: Insufficient documentation

## 2022-09-29 LAB — PROTIME-INR
INR: 1.3 — ABNORMAL HIGH (ref 0.8–1.2)
Prothrombin Time: 16 seconds — ABNORMAL HIGH (ref 11.4–15.2)

## 2022-09-29 NOTE — Addendum Note (Signed)
Encounter addended by: Christinia Gully, RN on: 09/29/2022 12:54 PM  Actions taken: Clinical Note Signed

## 2022-09-29 NOTE — Progress Notes (Signed)
Pt presents to VAD clinic for wound care by himself.   Currently on Ancef 2g q 8 hours via RUA PICC with an expected end date 09/29/22.   Per ID on 08/31/22: Continue cefazolin x 6 weeks EOT 12/25. Then transition to cefadroxil 500 mg p.o. twice daily for chronic suppression.  Spoke with [place] from ID to confirm above. ID office is closed today. Pt has appt with ID next Thursday. So we will plan to continue IV Ancef until pt is seen in ID office next week. Pt aware and will continue IV meds.    Exit site care:  Existing VAC dressing removed followed by VAC dressing and sponge removed using sterile technique. Small black sponge removed from shallow tunnel. Wound bed cleansed with hypochlorous spray and allowed to dry. Skin surrounding site cleansed with CHG swab x 2 and allowed to dry. Wound measures 6 x 2 x 0.5. Covered sponge with clear vac dressing. Attached to suction canister. Wound suction set at -125. Good seal achieved. No leak or blockage alarms noted.  Anchor re-applied. Exit site healed and partially incorporated, the velour is exposed 5 cm at exit site. Wound bed beefy red. Wound does not tunnel. Tenderness noted with cleansing and sponge change. Moderate amount of serosanguinous drainage -  pt has not changed canister since last week.  No foul odor or rash noted. Pt denies fever or chills. Plan for wound vac change on Tuesday in [place] clinic per Dr Prescott Gum. Pt instructed to bring sponge and canister to appt. Pt has adequate dressing supplies at home.       Plan:  Return to [place] clinic in 1 week for wound vac change and INR with Dr Prescott Gum Coumadin dosing per pharm D   Tanda Rockers RN,BSN [place] Coordinator  Office: [phone]  24/7 Pager: [phone]

## 2022-09-29 NOTE — Progress Notes (Signed)
LVAD INR 

## 2022-10-01 ENCOUNTER — Other Ambulatory Visit (HOSPITAL_COMMUNITY): Payer: Self-pay | Admitting: *Deleted

## 2022-10-01 DIAGNOSIS — Z7901 Long term (current) use of anticoagulants: Secondary | ICD-10-CM

## 2022-10-01 DIAGNOSIS — Z95811 Presence of heart assist device: Secondary | ICD-10-CM

## 2022-10-02 ENCOUNTER — Encounter: Payer: Self-pay | Admitting: Obstetrics and Gynecology

## 2022-10-02 ENCOUNTER — Other Ambulatory Visit: Payer: Medicaid Other | Admitting: Obstetrics and Gynecology

## 2022-10-02 NOTE — Patient Instructions (Signed)
Hi Jeffrey [name] to speak with you today-don't forget to follow up with provider regarding antibiotics and Social Work.  Jeffrey [name] was given information about Medicaid Managed Care team care coordination services as a part of their Healthy [facility] Medicaid benefit. Jeffrey [name] verbally consented to engagement with the [facility] Managed Care team.   If you are experiencing a medical emergency, please call 911 or report to your local emergency department or urgent care.   If you have a non-emergency medical problem during routine business hours, please contact your provider's office and ask to speak with a nurse.   For questions related to your Healthy [facility] health plan, please call: [phone] or visit the homepage here: [email or web address]  If you would like to schedule transportation through your Healthy [facility] plan, please call the following number at least 2 days in advance of your appointment: [phone]  For information about your ride after you set it up, call Ride Assist at [phone]. Use this number to activate a Will Call pickup, or if your transportation is late for a scheduled pickup. Use this number, too, if you need to make a change or cancel a previously scheduled reservation.  If you need transportation services right away, call [phone]. The after-hours call center is staffed 24 hours to handle ride assistance and urgent reservation requests (including discharges) 365 days a year. Urgent trips include sick visits, hospital discharge requests and life-sustaining treatment.  Call the [place] at [phone], at any time, 24 hours a day, 7 days a week. If you are in danger or need immediate medical attention call 911.  If you would like help to quit smoking, call 1-800-QUIT-NOW (234) 425-2728) OR Espaol: 1-855-Djelo-Ya (7-793-903-0092) o para ms informacin haga clic aqu or Text READY to 200-400 to  register via text  Jeffrey [name] - following are the goals we discussed in your visit today:   Goals Addressed             This Visit's Progress    Protect My Health       Timeframe:  Long-Range Goal Priority:  High Start Date:    09/24/20                         Expected End Date:  ongoing             - schedule recommended health tests  - schedule and keep appointment for annual check-up  10/02/22:  [facility] appt 10/08/22.  Recently seen and evaluated by PCP, CARDS   Patient verbalizes understanding of instructions and care plan provided today and agrees to view in [place]. Active MyChart status and patient understanding of how to access instructions and care plan via MyChart confirmed with patient.     The Managed Medicaid care management team will reach out to the patient again over the next 30 business  days.  The  Patient  has been provided with contact information for the Managed Medicaid care management team and has been advised to call with any health related questions or concerns.   [name] RN, BSN [place] Management Coordinator - Managed Medicaid High Risk [phone]   Following is a copy of your plan of care:  Care Plan : General Plan of Care (Adult)  Updates made by Gayla Medicus, RN since 10/02/2022 12:00 AM     Problem: Health Promotion or Disease Self-Management (General Plan of Care)  Priority: High  Onset Date: 08/18/2022  Note:   Current Barriers:  Chronic Disease Management support and education needs, S/P LVAD 07/21/21, DM, HTN, OSA, CHF, Afib, CKD, gout, DDD, h/o renal Ca 10/02/22:  patient attending weekly visits for wound care-wound vac in place and PICC line removed yesterday-patient to remain on oral Abx.  Patient has regular follow up at [facility].  BP and BG WNL.  HgB A1C-5.3 on 09/16/22.  Patient very stressed about waiting time for disability approval-90% complete per patient.  Has not received approval  for food stamps to date.  Patient requested to speak with SW and will follow up with SW at [facility] as well.    Nurse Case Manager Clinical Goal(s):  Over the next 90 days, patient will attend all scheduled medical appointments: Over the next 30 days, patient will have dental appt scheduled  Interventions:  Inter-disciplinary care team collaboration (see longitudinal plan of care) Collaborated with BSW BSW referral for disability information Evaluation of current treatment plan and patient's adherence to plan as established by provider. .  03/12/22: BSW competed telephone outreach with patient's wife. She stated they were able to get a daycare voucher and they did apply for foodstamps and she has to turn in her check stubs. She stated no other resources are needed at this time. Reviewed medications with patient. Discussed plans with patient for ongoing care management follow up and provided patient with direct contact information for care management team Provided patient with educational materials related to exercise. Reviewed scheduled/upcoming provider appointments. Collaborated with Pharmacy Pharmacy referral for medication review  Patient Goals/Self-Care Activities Over the next 90 days, patient will:  -Attends all scheduled provider appointments Calls provider office for new concerns or questions Contact [facility] to speak with SW Follow up with provider regarding prescription for oral anibiotics  Follow Up Plan: The Managed Medicaid care management team will reach out to the patient again over the next 30 business  days.  The patient has been provided with contact information for the Managed Medicaid care management team and has been advised to call with any health related questions or concerns.

## 2022-10-02 NOTE — Patient Outreach (Signed)
Medicaid Managed Care   Nurse Care Manager Note  10/02/2022 Name:  Jeffrey [name] MRN:  [id number] DOB:  02-02-1976  Jeffrey [name] is an 46 y.o. year old male who is a primary patient of Pass[place], Jake Church I, NP.  The [facility] Managed Care Coordination team was consulted for assistance with:    Chronic healthcare management needs, DM, HTN, OSA, CHF, Afib, DLD, CKD, gout, h/o renal Ca  Jeffrey [name] was given information about Medicaid Managed Care Coordination team services today. Jeffrey [name] Patient agreed to services and verbal consent obtained.  Engaged with patient by telephone for follow up visit in response to provider referral for case management and/or care coordination services.   Assessments/Interventions:  Review of past medical history, allergies, medications, health status, including review of consultants reports, laboratory and other test data, was performed as part of comprehensive evaluation and provision of chronic care management services.  SDOH (Social Determinants of Health) assessments and interventions performed: SDOH Interventions    Flowsheet Row Patient Outreach Telephone from 10/02/2022 in [place] Patient Outreach Telephone from 07/30/2022 in [place] Patient Outreach Telephone from 05/18/2022 in [place] Patient Outreach Telephone from 02/13/2022 in [place] Patient Outreach Telephone from 01/12/2022 in [place] Patient Outreach Telephone from 12/12/2021 in [place] Interventions        Food Insecurity Interventions -- -- -- -- Other (Comment)  [applied for food stamps-has not heard-is going to DSS office to follow up] --  Housing Interventions -- -- -- -- Intervention Not Indicated --  Transportation Interventions -- -- --  Other (Comment)  [Insurance Plan transportation available to patient] -- --  Utilities Interventions -- Intervention Not Indicated -- -- -- --  Alcohol Usage Interventions -- Intervention Not Indicated (Score <7) -- -- -- --  Financial Strain Interventions Other (Comment)  [referral placed] -- -- -- -- Other (Comment)  [referral to MM BSW]  Physical Activity Interventions -- Intervention Not Indicated -- -- -- --  Stress Interventions Other (Comment)  [referral placed for BSW-patient will also f/u with [facility]] -- Intervention Not Indicated -- -- --     Care Plan  Allergies  Allergen Reactions   Aspirin Shortness Of Breath, Itching and Rash     Burning sensation (Patient reports he tolerates other NSAIDS)    Bee Venom Hives and Swelling   Lisinopril Cough   Shrimp (Diagnostic) Rash    Medications Reviewed Today     Reviewed by Gayla Medicus, RN (Registered Nurse) on 10/02/22 at 45  Med List Status: <None>   Medication Order Taking? Sig Documenting Provider Last Dose Status Informant  Accu-Chek FastClix Lancets MISC [id number] No USE AS DIRECTED FOUR TIMES DAILY Bensimhon, Shaune Pascal, MD Taking Active   acetaminophen (TYLENOL) 325 MG tablet [id number] No Take 2 tablets (650 mg total) by mouth every 6 (six) hours as needed for fever. Darrick Grinder D, NP Taking Active Self, Pharmacy Records  allopurinol (ZYLOPRIM) 100 MG tablet [id number] No Take 1 tablet (100 mg total) by mouth daily. Bensimhon, Shaune Pascal, MD Taking Active Self, Pharmacy Records  amiodarone (PACERONE) 200 MG tablet [id number] No Take 1 tablet (200 mg total) by mouth daily. Bensimhon, Shaune Pascal, MD Taking Active Self, Pharmacy Records  amLODipine (NORVASC) 10 MG tablet [id number] No Take 1 tablet (10 mg total) by mouth daily  Bensimhon, Shaune Pascal, MD Taking Active Self, Pharmacy Records  colchicine 0.6 MG tablet [id number] No Take 1 tablet (0.6 mg total) by mouth once daily. Bensimhon, Shaune Pascal, MD Taking Active Self, Pharmacy  Records  diclofenac Sodium (VOLTAREN) 1 % GEL [id number] No Apply 2 g topically 4 (four) times daily.  Patient taking differently: Apply 2 g topically 4 (four) times daily as needed (pain).   Jeffrey Ribas, MD Taking Active Self, Pharmacy Records  docusate sodium (COLACE) 100 MG capsule [id number] No Take 1 capsule (100 mg total) by mouth 2 (two) times daily. Conrad Faribault, NP Taking Active Self, Pharmacy Records  doxazosin (CARDURA) 1 MG tablet [id number]  Take 2 tablets (2 mg total) by mouth daily. Bensimhon, Shaune Pascal, MD  Active   gabapentin (NEURONTIN) 400 MG capsule [id number] No Take 1 capsule (400 mg total) by mouth 3 (three) times daily. Bensimhon, Shaune Pascal, MD Taking Active   glucose blood test strip [id number] No Use as directed 4 times daily. Bo Merino I, NP Taking Active Self, Pharmacy Records  HYDROcodone-acetaminophen [facility]) 10-325 MG tablet [id number] No Take 1 tablet by mouth every 6 (six) hours as needed for severe pain. Bensimhon, Shaune Pascal, MD Taking Active   Insulin Pen Needle (TRUEPLUS 5-BEVEL PEN NEEDLES) 29G X 12.7MM MISC [id number] No Use as directed in the morning, at noon, and at bedtime. Teena Dunk, NP Taking Active Self, Pharmacy Records  Insulin Syringe-Needle U-100 (ULTICARE INSULIN SYRINGE) 30G X 1/2" 1 ML MISC [id number] No USE TO INJECT INSULIN TWICE DAILY. Bensimhon, Shaune Pascal, MD Taking Active   isosorbide-hydrALAZINE (BIDIL) 20-37.5 MG tablet [id number] No Take 2 tablets by mouth 3 (three) times daily. Bensimhon, Shaune Pascal, MD Taking Active   meclizine (ANTIVERT) 25 MG tablet [id number] No Take 1 tablet (25 mg total) by mouth 3 (three) times daily as needed for dizziness. Bensimhon, Shaune Pascal, MD Taking Active Self, Pharmacy Records  metFORMIN (GLUCOPHAGE) 1000 MG tablet [id number] No Take 1 tablet (1,000 mg total) by mouth 2 (two) times daily with a meal. Bensimhon, Shaune Pascal, MD Taking Active   pantoprazole (PROTONIX) 40 MG tablet [id number] No Take 1 tablet (40  mg total) by mouth once daily. Bensimhon, Shaune Pascal, MD Taking Active Self, Pharmacy Records  sacubitril-valsartan [facility]) 97-103 MG [id number] No Take 1 tablet by mouth 2 (two) times daily. Bensimhon, Shaune Pascal, MD Taking Active Self, Pharmacy Records  Semaglutide, 2 MG/DOSE, (OZEMPIC, 2 MG/DOSE,) 8 MG/3ML SOPN [id number] No Inject 2 mg into the skin once a week. Bensimhon, Shaune Pascal, MD Taking Active Self, Pharmacy Records  sertraline (ZOLOFT) 100 MG tablet [id number] No Take 1.5 tablets (150 mg total) by mouth daily. Bensimhon, Shaune Pascal, MD Taking Active Self, Pharmacy Records  spironolactone (ALDACTONE) 25 MG tablet [id number] No Take 1 tablet (25 mg total) by mouth daily. Bensimhon, Shaune Pascal, MD Taking Active   torsemide (DEMADEX) 20 MG tablet [id number] No Take 1 tablet (20 mg total) by mouth daily as needed (If wt gain > 3 lb in 24 hr or > 5 lb in 1 week). Fenton Foy, NP Taking Active Self, Pharmacy Records  traMADol HCl 100 MG TABS [id number] No Take 1 tablet (100 mg) by mouth every 6 (six) hours as needed for pain Bensimhon, Shaune Pascal, MD Taking Active   traZODone (DESYREL) 100 MG tablet [id number] No Take 1 tablet (100 mg total) by mouth at bedtime. Bensimhon, Shaune Pascal, MD Taking Active Self, Pharmacy Records  warfarin (COUMADIN) 4 MG tablet  [id number] No Take 2 tablet (8 mg) daily or as directed by HF clinic Bensimhon, Shaune Pascal, MD Taking Active            Med Note Orma Render   Mon Sep 21, 2022 11:44 AM) 4 mg every Monday and 8 mg all other days  Zinc Sulfate 220 (50 Zn) MG TABS [id number] No Take 1 tablet (220 mg total) by mouth daily. 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[place]) 08/10/2022   Presence of left ventricular assist device (LVAD) ([place]) 07/25/2021   Acute on chronic systolic CHF (congestive heart failure) ([place]) 07/18/2021    Cardiogenic shock (HCC)    Atrial fibrillation with RVR ([place]) 04/30/2021   Acute hypoxemic respiratory failure (HCC)    Right wrist pain 04/16/2021   Absolute anemia    Rectal bleeding    Iron deficiency 12/25/2020   Long term current use of anticoagulant - apixaban 12/25/2020   Paroxysmal atrial fibrillation (HCC)    Lactic acidosis 11/22/2020   Hypotension 11/22/2020   Aspiration into airway    ICD (implantable cardioverter-defibrillator) battery depletion 05/15/2020   Endotracheal tube present    Respiratory failure ([place])    Acute encephalopathy    Acute pulmonary edema (HCC)    NICM (nonischemic cardiomyopathy) ([place]) 04/24/2020   S/P vasectomy 12/20/2019   Anxiety 12/20/2019   Hemoglobin A1C between 7% and 9% indicating borderline diabetic control ([place]) 12/20/2019   Seasonal allergies 12/20/2019   Insomnia 12/20/2019   Vasectomy evaluation 06/20/2019   Visual problems 06/20/2019   Blurry vision, bilateral 06/20/2019   Hyperglycemia 02/13/2019   Hyperosmolar (nonketotic) coma ([place]) 01/19/2019   AICD (automatic cardioverter/defibrillator) present 01/19/2019   Hyperlipidemia 12/07/2018   Follow-up exam 11/22/2018   Class 3 severe obesity due to excess calories with serious comorbidity and body mass index (BMI) of 45.0 to 49.9 in adult ([place]) 11/22/2018   Dizziness 11/22/2018   Lingular pneumonia 08/04/2018   Ventricular tachycardia ([place]) 12/30/2017   PAF (paroxysmal atrial fibrillation) (HCC)    Dilated cardiomyopathy ([place])    Pollen allergy 02/17/2017   Dyslipidemia 02/17/2017   Hematochezia 11/08/2015   CKD (chronic kidney disease), stage III ([place]) 06/20/2015   Cardiac defibrillator in situ    Chronic systolic CHF (congestive heart failure) ([place]) 03/11/2015   Gouty arthritis 03/11/2015   Chest pain 12/28/2014   Acute renal failure superimposed on stage 3 chronic kidney disease ([place]) 04/08/2014   Aspirin allergy 04/08/2014   Renal cell carcinoma (HCC)    Gout attack  11/10/2011   Morbid obesity ([place]) 10/23/2011   OSA (obstructive sleep apnea) 10/23/2011   Type 2 diabetes mellitus with stage 3 chronic kidney disease, with long-term current use of insulin ([place]) 10/22/2011   Hyperlipidemia associated with type 2 diabetes mellitus ([place]) 12/25/2010   Hypertension associated with diabetes ([place]) 12/25/2010   Conditions to be addressed/monitored per PCP order:  Chronic healthcare management needs, DM, HTN, OSA, CHF, Afib, DLD, CKD, gout, h/o renal Ca, OSA, HLD, S/P LVAD  Care Plan : General Plan of Care (Adult)  Updates made by Gayla Medicus, RN since 10/02/2022 12:00 AM     Problem: Health Promotion or Disease Self-Management (General Plan of Care)   Priority: High  Onset Date: 08/18/2022  Note:   Current Barriers:  Chronic Disease Management support  and education needs, S/P LVAD 07/21/21, DM, HTN, OSA, CHF, Afib, CKD, gout, DDD, h/o renal Ca 10/02/22:  patient attending weekly visits for wound care-wound vac in place and PICC line removed yesterday-patient to remain on oral Abx.  Patient has regular follow up at [facility].  BP and BG WNL.  HgB A1C-5.3 on 09/16/22.  Patient very stressed about waiting time for disability approval-90% complete per patient.  Has not received approval for food stamps to date.  Patient requested to speak with SW and will follow up with SW at [facility] as well.    Nurse Case Manager Clinical Goal(s):  Over the next 90 days, patient will attend all scheduled medical appointments: Over the next 30 days, patient will have dental appt scheduled  Interventions:  Inter-disciplinary care team collaboration (see longitudinal plan of care) Collaborated with BSW BSW referral for disability information Evaluation of current treatment plan and patient's adherence to plan as established by provider. .  03/12/22: BSW competed telephone outreach with patient's wife. She stated they were able to get a daycare voucher and they did apply for  foodstamps and she has to turn in her check stubs. She stated no other resources are needed at this time. Reviewed medications with patient. Discussed plans with patient for ongoing care management follow up and provided patient with direct contact information for care management team Provided patient with educational materials related to exercise. Reviewed scheduled/upcoming provider appointments. Collaborated with Pharmacy Pharmacy referral for medication review  Patient Goals/Self-Care Activities Over the next 90 days, patient will:  -Attends all scheduled provider appointments Calls provider office for new concerns or questions Contact [facility] to speak with SW Follow up with provider regarding prescription for oral anibiotics  Follow Up Plan: The Managed Medicaid care management team will reach out to the patient again over the next 30 business  days.  The patient has been provided with contact information for the Managed Medicaid care management team and has been advised to call with any health related questions or concerns.    Follow Up:  Patient agrees to Care Plan and Follow-up.  Plan: The Managed Medicaid care management team will reach out to the patient again over the next 30 business  days. and The  Patient has been provided with contact information for the Managed Medicaid care management team and has been advised to call with any health related questions or concerns.  Date/time of next scheduled RN care management/care coordination outreach:  10/30/22 at 1230.

## 2022-10-06 ENCOUNTER — Other Ambulatory Visit: Payer: Medicaid Other

## 2022-10-06 ENCOUNTER — Ambulatory Visit (HOSPITAL_COMMUNITY)
Admission: RE | Admit: 2022-10-06 | Discharge: 2022-10-06 | Disposition: A | Discharge status: Home / Self Care | Payer: Medicaid Other | Source: Ambulatory Visit | Attending: Cardiology | Admitting: Cardiology

## 2022-10-06 ENCOUNTER — Other Ambulatory Visit: Payer: Self-pay

## 2022-10-06 ENCOUNTER — Ambulatory Visit (HOSPITAL_COMMUNITY): Payer: Self-pay | Admitting: Pharmacist

## 2022-10-06 DIAGNOSIS — Z7901 Long term (current) use of anticoagulants: Secondary | ICD-10-CM | POA: Insufficient documentation

## 2022-10-06 DIAGNOSIS — X58XXXA Exposure to other specified factors, initial encounter: Secondary | ICD-10-CM | POA: Diagnosis not present

## 2022-10-06 DIAGNOSIS — Z4801 Encounter for change or removal of surgical wound dressing: Secondary | ICD-10-CM | POA: Diagnosis not present

## 2022-10-06 DIAGNOSIS — T827XXA Infection and inflammatory reaction due to other cardiac and vascular devices, implants and grafts, initial encounter: Secondary | ICD-10-CM | POA: Diagnosis not present

## 2022-10-06 DIAGNOSIS — Z95811 Presence of heart assist device: Secondary | ICD-10-CM | POA: Diagnosis not present

## 2022-10-06 LAB — PROTIME-INR
INR: 2.6 — ABNORMAL HIGH (ref 0.8–1.2)
Prothrombin Time: 27.7 seconds — ABNORMAL HIGH (ref 11.4–15.2)

## 2022-10-06 MED ORDER — CEFADROXIL 500 MG PO CAPS
1000.0000 mg | ORAL_CAPSULE | Freq: Two times a day (BID) | ORAL | 2 refills | Status: DC
  Filled 2022-10-06: qty 120, 30d supply, fill #0

## 2022-10-06 NOTE — Patient Instructions (Signed)
Visit Information  Jeffrey [name] was given information about Medicaid Managed Care team care coordination services as a part of their Healthy [facility] Medicaid benefit. Jeffrey [name] verbally consented to engagement with the [facility] Managed Care team.   If you are experiencing a medical emergency, please call 911 or report to your local emergency department or urgent care.   If you have a non-emergency medical problem during routine business hours, please contact your provider's office and ask to speak with a nurse.   For questions related to your Healthy [facility] health plan, please call: [phone] or visit the homepage here: [email or web address]  If you would like to schedule transportation through your Healthy [facility] plan, please call the following number at least 2 days in advance of your appointment: [phone]  For information about your ride after you set it up, call Ride Assist at [phone]. Use this number to activate a Will Call pickup, or if your transportation is late for a scheduled pickup. Use this number, too, if you need to make a change or cancel a previously scheduled reservation.  If you need transportation services right away, call [phone]. The after-hours call center is staffed 24 hours to handle ride assistance and urgent reservation requests (including discharges) 365 days a year. Urgent trips include sick visits, hospital discharge requests and life-sustaining treatment.  Call the [place] at [phone], at any time, 24 hours a day, 7 days a week. If you are in danger or need immediate medical attention call 911.  If you would like help to quit smoking, call 1-800-QUIT-NOW (916) 615-5889) OR Espaol: 1-855-Djelo-Ya (9-373-428-7681) o para ms informacin haga clic aqu or Text READY to 200-400 to register via text  Jeffrey [name] - following are the goals we discussed in your visit today:   Goals  Addressed   None     Social Worker will follow up on 11/06/22.   Jeffrey [name], BSW, [place]  High Risk Managed Medicaid Team  [phone]   Following is a copy of your plan of care:  Care Plan : General Plan of Care (Adult)  Updates made by Ethelda Chick since 10/06/2022 12:00 AM     Problem: Health Promotion or Disease Self-Management (General Plan of Care)   Priority: High  Onset Date: 08/18/2022  Note:   Current Barriers:  Chronic Disease Management support and education needs, S/P LVAD 07/21/21, DM, HTN, OSA, CHF, Afib, CKD, gout, DDD, h/o renal Ca 10/02/22:  patient attending weekly visits for wound care-wound vac in place and PICC line removed yesterday-patient to remain on oral Abx.  Patient has regular follow up at [facility].  BP and BG WNL.  HgB A1C-5.3 on 09/16/22.  Patient very stressed about waiting time for disability approval-90% complete per patient.  Has not received approval for food stamps to date.  Patient requested to speak with SW and will follow up with SW at [facility] as well.    Nurse Case Manager Clinical Goal(s):  Over the next 90 days, patient will attend all scheduled medical appointments: Over the next 30 days, patient will have dental appt scheduled  Interventions:  Inter-disciplinary care team collaboration (see longitudinal plan of care) Collaborated with BSW BSW referral for disability information Evaluation of current treatment plan and patient's adherence to plan as established by provider. .   BSW completed a telephone outreach with patient, he stated he applied for disability over a year ago and has not  heard anything back yet. Every time he calls he is on hold for over 2 hours and the phone hangs up. BSW informed patient it can take a long time to get a decision from disability. BSW provided patient with the telephone number for legal aid to assist with the process. Patient states he was able to be  added to his wife's foodstamp case, no other resources are needed at this time. Reviewed medications with patient. Discussed plans with patient for ongoing care management follow up and provided patient with direct contact information for care management team Provided patient with educational materials related to exercise. Reviewed scheduled/upcoming provider appointments. Collaborated with Pharmacy Pharmacy referral for medication review  Patient Goals/Self-Care Activities Over the next 90 days, patient will:  -Attends all scheduled provider appointments Calls provider office for new concerns or questions Contact [facility] to speak with SW Follow up with provider regarding prescription for oral anibiotics  Follow Up Plan: The Managed Medicaid care management team will reach out to the patient again over the next 30 business  days.  The patient has been provided with contact information for the Managed Medicaid care management team and has been advised to call with any health related questions or concerns.

## 2022-10-06 NOTE — Patient Outreach (Signed)
Medicaid Managed Care Social Work Note  10/06/2022 Name:  Jeffrey [name] MRN:  [id number] DOB:  1976-08-14  Jeffrey [name] is an 47 y.o. year old male who is a primary patient of [place], Jake Church I, NP.  The [facility] Managed Care Coordination team was consulted for assistance with:   disability  Mr. Que was given information about Medicaid Managed Care Coordination team services today. Jeffrey [name] Patient agreed to services and verbal consent obtained.  Engaged with patient  for by telephone forfollow up visit in response to referral for case management and/or care coordination services.   Assessments/Interventions:  Review of past medical history, allergies, medications, health status, including review of consultants reports, laboratory and other test data, was performed as part of comprehensive evaluation and provision of chronic care management services.  SDOH: (Social Determinant of Health) assessments and interventions performed: SDOH Interventions    Flowsheet Row Patient Outreach Telephone from 10/02/2022 in [place] Patient Outreach Telephone from 07/30/2022 in [place] Coordination Patient Outreach Telephone from 05/18/2022 in [place] Patient Outreach Telephone from 02/13/2022 in [place] Patient Outreach Telephone from 01/12/2022 in [place] Patient Outreach Telephone from 12/12/2021 in [place] Interventions        Food Insecurity Interventions -- -- -- -- Other (Comment)  [applied for food stamps-has not heard-is going to DSS office to follow up] --  Housing Interventions -- -- -- -- Intervention Not Indicated --  Transportation Interventions -- -- -- Other (Comment)  [Insurance Plan transportation available to patient] -- --  Utilities  Interventions -- Intervention Not Indicated -- -- -- --  Alcohol Usage Interventions -- Intervention Not Indicated (Score <7) -- -- -- --  Financial Strain Interventions Other (Comment)  [referral placed] -- -- -- -- Other (Comment)  [referral to MM BSW]  Physical Activity Interventions -- Intervention Not Indicated -- -- -- --  Stress Interventions Other (Comment)  [referral placed for BSW-patient will [facility]] -- Intervention Not Indicated -- -- --     BSW completed a telephone outreach with patient, he stated he applied for disability over a year ago and has not heard anything back yet. Every time he calls he is on hold for over 2 hours and the phone hangs up. BSW informed patient it can take a long time to get a decision from disability. BSW provided patient with the telephone number for legal aid to assist with the process. Patient states he was able to be added to his wife's foodstamp case, no other resources are needed at this time.  Advanced Directives Status:  Not addressed in this encounter.  Care Plan                 Allergies  Allergen Reactions   Aspirin Shortness Of Breath, Itching and Rash     Burning sensation (Patient reports he tolerates other NSAIDS)    Bee Venom Hives and Swelling   Lisinopril Cough   Shrimp (Diagnostic) Rash    Medications Reviewed Today     Reviewed by Gayla Medicus, RN (Registered Nurse) on 10/02/22 at 73  Med List Status: <None>   Medication Order Taking? Sig Documenting Provider Last Dose Status Informant  Accu-Chek FastClix Lancets MISC [id number] No USE AS DIRECTED FOUR TIMES DAILY Bensimhon, Shaune Pascal, MD Taking Active   acetaminophen (  TYLENOL) 325 MG tablet [id number] No Take 2 tablets (650 mg total) by mouth every 6 (six) hours as needed for fever. Darrick Grinder D, NP Taking Active Self, Pharmacy Records  allopurinol (ZYLOPRIM) 100 MG tablet [id number] No Take 1 tablet (100 mg total) by mouth daily. Bensimhon, Shaune Pascal, MD  Taking Active Self, Pharmacy Records  amiodarone (PACERONE) 200 MG tablet [id number] No Take 1 tablet (200 mg total) by mouth daily. Bensimhon, Shaune Pascal, MD Taking Active Self, Pharmacy Records  amLODipine (NORVASC) 10 MG tablet [id number] No Take 1 tablet (10 mg total) by mouth daily Bensimhon, Shaune Pascal, MD Taking Active Self, Pharmacy Records  colchicine 0.6 MG tablet [id number] No Take 1 tablet (0.6 mg total) by mouth once daily. Bensimhon, Shaune Pascal, MD Taking Active Self, Pharmacy Records  diclofenac Sodium (VOLTAREN) 1 % GEL [id number] No Apply 2 g topically 4 (four) times daily.  Patient taking differently: Apply 2 g topically 4 (four) times daily as needed (pain).   Izora Ribas, MD Taking Active Self, Pharmacy Records  docusate sodium (COLACE) 100 MG capsule [id number] No Take 1 capsule (100 mg total) by mouth 2 (two) times daily. Conrad Ragland, NP Taking Active Self, Pharmacy Records  doxazosin (CARDURA) 1 MG tablet [id number]  Take 2 tablets (2 mg total) by mouth daily. Bensimhon, Shaune Pascal, MD  Active   gabapentin (NEURONTIN) 400 MG capsule [id number] No Take 1 capsule (400 mg total) by mouth 3 (three) times daily. Bensimhon, Shaune Pascal, MD Taking Active   glucose blood test strip [id number] No Use as directed 4 times daily. Bo Merino I, NP Taking Active Self, Pharmacy Records  HYDROcodone-acetaminophen [facility]) 10-325 MG tablet [id number] No Take 1 tablet by mouth every 6 (six) hours as needed for severe pain. Bensimhon, Shaune Pascal, MD Taking Active   Insulin Pen Needle (TRUEPLUS 5-BEVEL PEN NEEDLES) 29G X 12.7MM MISC [id number] No Use as directed in the morning, at noon, and at bedtime. Teena Dunk, NP Taking Active Self, Pharmacy Records  Insulin Syringe-Needle U-100 (ULTICARE INSULIN SYRINGE) 30G X 1/2" 1 ML MISC [id number] No USE TO INJECT INSULIN TWICE DAILY. Bensimhon, Shaune Pascal, MD Taking Active   isosorbide-hydrALAZINE (BIDIL) 20-37.5 MG tablet [id number] No Take 2 tablets by mouth  3 (three) times daily. Bensimhon, Shaune Pascal, MD Taking Active   meclizine (ANTIVERT) 25 MG tablet [id number] No Take 1 tablet (25 mg total) by mouth 3 (three) times daily as needed for dizziness. Bensimhon, Shaune Pascal, MD Taking Active Self, Pharmacy Records  metFORMIN (GLUCOPHAGE) 1000 MG tablet [id number] No Take 1 tablet (1,000 mg total) by mouth 2 (two) times daily with a meal. Bensimhon, Shaune Pascal, MD Taking Active   pantoprazole (PROTONIX) 40 MG tablet [id number] No Take 1 tablet (40 mg total) by mouth once daily. Bensimhon, Shaune Pascal, MD Taking Active Self, Pharmacy Records  sacubitril-valsartan [facility]) 97-103 MG [id number] No Take 1 tablet by mouth 2 (two) times daily. Bensimhon, Shaune Pascal, MD Taking Active Self, Pharmacy Records  Semaglutide, 2 MG/DOSE, (OZEMPIC, 2 MG/DOSE,) 8 MG/3ML SOPN [id number] No Inject 2 mg into the skin once a week. Bensimhon, Shaune Pascal, MD Taking Active Self, Pharmacy Records  sertraline (ZOLOFT) 100 MG tablet [id number] No Take 1.5 tablets (150 mg total) by mouth daily. Bensimhon, Shaune Pascal, MD Taking Active Self, Pharmacy Records  spironolactone (ALDACTONE) 25 MG tablet [id number] No Take 1 tablet (25 mg total) by mouth daily. Bensimhon, Shaune Pascal, MD Taking Active   torsemide (DEMADEX) 20 MG tablet [id number]  No Take 1 tablet (20 mg total) by mouth daily as needed (If wt gain > 3 lb in 24 hr or > 5 lb in 1 week). Fenton Foy, NP Taking Active Self, Pharmacy Records  traMADol HCl 100 MG TABS [id number] No Take 1 tablet (100 mg) by mouth every 6 (six) hours as needed for pain Bensimhon, Shaune Pascal, MD Taking Active   traZODone (DESYREL) 100 MG tablet [id number] No Take 1 tablet (100 mg total) by mouth at bedtime. Bensimhon, Shaune Pascal, MD Taking Active Self, Pharmacy Records  warfarin (COUMADIN) 4 MG tablet [id number] No Take 2 tablet (8 mg) daily or as directed by HF clinic Bensimhon, Shaune Pascal, MD Taking Active            Med Note Orma Render   Mon Sep 21, 2022 11:44 AM) 4 mg  every Monday and 8 mg all other days  Zinc Sulfate 220 (50 Zn) MG TABS [id number] No Take 1 tablet (220 mg total) by mouth daily. 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[place]) 08/10/2022   Presence of left ventricular assist device (LVAD) ([place]) 07/25/2021   Acute on chronic systolic CHF (congestive heart failure) ([place]) 07/18/2021   Cardiogenic shock (HCC)    Atrial fibrillation with RVR ([place]) 04/30/2021   Acute hypoxemic respiratory failure (HCC)    Right wrist pain 04/16/2021   Absolute anemia    Rectal bleeding    Iron deficiency 12/25/2020   Long term current use of anticoagulant - apixaban 12/25/2020   Paroxysmal atrial fibrillation (HCC)    Lactic acidosis 11/22/2020   Hypotension 11/22/2020   Aspiration into airway    ICD (implantable cardioverter-defibrillator) battery depletion 05/15/2020   Endotracheal tube present    Respiratory failure ([place])    Acute encephalopathy    Acute pulmonary edema (HCC)    NICM (nonischemic cardiomyopathy) ([place]) 04/24/2020   S/P vasectomy 12/20/2019   Anxiety 12/20/2019   Hemoglobin A1C between 7% and 9% indicating borderline diabetic control ([place]) 12/20/2019   Seasonal allergies 12/20/2019   Insomnia 12/20/2019   Vasectomy evaluation 06/20/2019   Visual problems 06/20/2019   Blurry vision, bilateral 06/20/2019   Hyperglycemia 02/13/2019   Hyperosmolar (nonketotic) coma ([place]) 01/19/2019   AICD (automatic cardioverter/defibrillator) present 01/19/2019   Hyperlipidemia 12/07/2018   Follow-up exam 11/22/2018   Class 3 severe obesity due to excess calories with serious comorbidity and body mass index (BMI) of 45.0 to 49.9 in adult ([place]) 11/22/2018   Dizziness 11/22/2018   Lingular pneumonia 08/04/2018   Ventricular tachycardia ([place]) 12/30/2017   PAF (paroxysmal atrial fibrillation) ([place])    Dilated  cardiomyopathy ([place])    Pollen allergy 02/17/2017   Dyslipidemia 02/17/2017   Hematochezia 11/08/2015   CKD (chronic kidney disease), stage III ([place]) 06/20/2015   Cardiac defibrillator in situ    Chronic systolic CHF (congestive heart failure) ([place]) 03/11/2015   Gouty arthritis 03/11/2015   Chest pain 12/28/2014   Acute renal failure superimposed on stage 3 chronic kidney disease ([place]) 04/08/2014   Aspirin allergy 04/08/2014   Renal cell carcinoma ([place])    Gout attack 11/10/2011   Morbid obesity ([place]) 10/23/2011   OSA (obstructive sleep apnea) 10/23/2011   Type 2 diabetes mellitus with stage 3 chronic kidney disease, with long-term current use of insulin ([place]) 10/22/2011  Hyperlipidemia associated with type 2 diabetes mellitus ([place]) 12/25/2010   Hypertension associated with diabetes ([place]) 12/25/2010    Conditions to be addressed/monitored per PCP order:   disability assistance  Care Plan : General Plan of Care (Adult)  Updates made by Ethelda Chick since 10/06/2022 12:00 AM     Problem: Health Promotion or Disease Self-Management (General Plan of Care)   Priority: High  Onset Date: 08/18/2022  Note:   Current Barriers:  Chronic Disease Management support and education needs, S/P LVAD 07/21/21, DM, HTN, OSA, CHF, Afib, CKD, gout, DDD, h/o renal Ca 10/02/22:  patient attending weekly visits for wound care-wound vac in place and PICC line removed yesterday-patient to remain on oral Abx.  Patient has regular follow up at [facility].  BP and BG WNL.  HgB A1C-5.3 on 09/16/22.  Patient very stressed about waiting time for disability approval-90% complete per patient.  Has not received approval for food stamps to date.  Patient requested to speak with SW and will follow up with SW at [facility] as well.    Nurse Case Manager Clinical Goal(s):  Over the next 90 days, patient will attend all scheduled medical appointments: Over the next 30 days, patient will have dental appt  scheduled  Interventions:  Inter-disciplinary care team collaboration (see longitudinal plan of care) Collaborated with BSW BSW referral for disability information Evaluation of current treatment plan and patient's adherence to plan as established by provider. .   BSW completed a telephone outreach with patient, he stated he applied for disability over a year ago and has not heard anything back yet. Every time he calls he is on hold for over 2 hours and the phone hangs up. BSW informed patient it can take a long time to get a decision from disability. BSW provided patient with the telephone number for legal aid to assist with the process. Patient states he was able to be added to his wife's foodstamp case, no other resources are needed at this time. Reviewed medications with patient. Discussed plans with patient for ongoing care management follow up and provided patient with direct contact information for care management team Provided patient with educational materials related to exercise. Reviewed scheduled/upcoming provider appointments. Collaborated with Pharmacy Pharmacy referral for medication review  Patient Goals/Self-Care Activities Over the next 90 days, patient will:  -Attends all scheduled provider appointments Calls provider office for new concerns or questions Contact [facility] to speak with SW Follow up with provider regarding prescription for oral anibiotics  Follow Up Plan: The Managed Medicaid care management team will reach out to the patient again over the next 30 business  days.  The patient has been provided with contact information for the Managed Medicaid care management team and has been advised to call with any health related questions or concerns.         Follow up:  Patient agrees to Care Plan and Follow-up.  Plan: The Managed Medicaid care management team will reach out to the patient again over the next 30 days.  Date/time of next scheduled Social Work  care management/care coordination outreach:  11/06/21  [name], Arita Miss, [place] Medicaid Team  [phone]

## 2022-10-06 NOTE — Progress Notes (Addendum)
[facility] by himself. He states that his wound vac has been alarming on and off since yesterday.  Per ID on 08/31/22: Continue cefazolin x 6 weeks EOT 12/25. Then transition to cefadroxil 500 mg p.o. twice daily for chronic suppression.  Spoke with Colletta [place] from ID to confirm above last week. Pt has appt with ID Thursday. Plan was to continue IV Ancef until pt is seen in ID office thursday. Pt was aware of this plan but HH pulled his picc line last week. Pt has been without antibiotics. Script was sent into for Cefadroxil 1000 mg bid today. Updated Janene Madeira, NP w/ID.   Exit site care:  Existing VAC dressing removed and sponge using sterile technique. Wound bed cleansed with hypochlorous spray and allowed to dry by Dr Darcey Nora. Skin surrounding site cleansed with CHG swab x 2 and allowed to dry.  Exit site healing and incorporated, the velour is exposed 5 cm at exit site. Wound bed beefy red. Wound does not tunnel. Tenderness noted with cleansing and sponge change. Small amount of serosanguinous drainage in canister. Hypochlorous soaked 4 x 4 placed in wound bed and covered with 4 x 4 gauze.  Culture obtained today. Anchor applied. No foul odor or rash noted. Pt denies fever or chills. Plan for dressing change on Thursday in [facility] per Dr Prescott Gum. Pt has adequate dressing supplies at home. Pt instructed he will need to mail his wound vac back to [facility] per the instructions he has at home with his vac supplies.      Plan:  [facility] Thursday for dressing change with Dr Prescott Gum Coumadin dosing per pharm D   Tanda Rockers RN,BSN [place] Coordinator  Office: [phone]  24/7 Pager: [phone]

## 2022-10-07 ENCOUNTER — Other Ambulatory Visit: Payer: Self-pay

## 2022-10-08 ENCOUNTER — Other Ambulatory Visit: Payer: Self-pay

## 2022-10-08 ENCOUNTER — Ambulatory Visit: Payer: Medicaid Other | Admitting: Internal Medicine

## 2022-10-08 ENCOUNTER — Ambulatory Visit (HOSPITAL_COMMUNITY)
Admission: RE | Admit: 2022-10-08 | Discharge: 2022-10-08 | Disposition: A | Discharge status: Home / Self Care | Payer: Medicaid Other | Source: Ambulatory Visit | Attending: Cardiology | Admitting: Cardiology

## 2022-10-08 ENCOUNTER — Encounter: Payer: Self-pay | Admitting: Internal Medicine

## 2022-10-08 VITALS — Ht 67.0 in | Wt 285.0 lb

## 2022-10-08 DIAGNOSIS — T827XXA Infection and inflammatory reaction due to other cardiac and vascular devices, implants and grafts, initial encounter: Secondary | ICD-10-CM

## 2022-10-08 DIAGNOSIS — Z452 Encounter for adjustment and management of vascular access device: Secondary | ICD-10-CM | POA: Insufficient documentation

## 2022-10-08 DIAGNOSIS — Z8619 Personal history of other infectious and parasitic diseases: Secondary | ICD-10-CM

## 2022-10-08 NOTE — Progress Notes (Signed)
Patient: Jeffrey [name]  DOB: 1976/05/08 MRN: [id number] PCP: Bo Merino I, NP      Patient Active Problem List   Diagnosis Date Noted   Infection associated with driveline of left ventricular assist device (LVAD) ([place]) 08/10/2022   Presence of left ventricular assist device (LVAD) ([place]) 07/25/2021   Acute on chronic systolic CHF (congestive heart failure) ([place]) 07/18/2021   Cardiogenic shock (HCC)    Atrial fibrillation with RVR ([place]) 04/30/2021   Acute hypoxemic respiratory failure (HCC)    Right wrist pain 04/16/2021   Absolute anemia    Rectal bleeding    Iron deficiency 12/25/2020   Long term current use of anticoagulant - apixaban 12/25/2020   Paroxysmal atrial fibrillation (HCC)    Lactic acidosis 11/22/2020   Hypotension 11/22/2020   Aspiration into airway    ICD (implantable cardioverter-defibrillator) battery depletion 05/15/2020   Endotracheal tube present    Respiratory failure ([place])    Acute encephalopathy    Acute pulmonary edema (HCC)    NICM (nonischemic cardiomyopathy) ([place]) 04/24/2020   S/P vasectomy 12/20/2019   Anxiety 12/20/2019   Hemoglobin A1C between 7% and 9% indicating borderline diabetic control ([place]) 12/20/2019   Seasonal allergies 12/20/2019   Insomnia 12/20/2019   Vasectomy evaluation 06/20/2019   Visual problems 06/20/2019   Blurry vision, bilateral 06/20/2019   Hyperglycemia 02/13/2019   Hyperosmolar (nonketotic) coma ([place]) 01/19/2019   AICD (automatic cardioverter/defibrillator) present 01/19/2019   Hyperlipidemia 12/07/2018   Follow-up exam 11/22/2018   Class 3 severe obesity due to excess calories with serious comorbidity and body mass index (BMI) of 45.0 to 49.9 in adult ([place]) 11/22/2018   Dizziness 11/22/2018   Lingular pneumonia 08/04/2018   Ventricular tachycardia ([place]) 12/30/2017   PAF (paroxysmal atrial fibrillation) ([place])    Dilated cardiomyopathy ([place])    Pollen allergy 02/17/2017   Dyslipidemia 02/17/2017    Hematochezia 11/08/2015   CKD (chronic kidney disease), stage III ([place]) 06/20/2015   Cardiac defibrillator in situ    Chronic systolic CHF (congestive heart failure) ([place]) 03/11/2015   Gouty arthritis 03/11/2015   Chest pain 12/28/2014   Acute renal failure superimposed on stage 3 chronic kidney disease ([place]) 04/08/2014   Aspirin allergy 04/08/2014   Renal cell carcinoma (HCC)    Gout attack 11/10/2011   Morbid obesity ([place]) 10/23/2011   OSA (obstructive sleep apnea) 10/23/2011   Type 2 diabetes mellitus with stage 3 chronic kidney disease, with long-term current use of insulin ([place]) 10/22/2011   Hyperlipidemia associated with type 2 diabetes mellitus ([place]) 12/25/2010   Hypertension associated with diabetes ([place]) 12/25/2010     Subjective:  Jeffrey [name] is a 47 y.o. M with PMHX of hypertension, renal cell carcinoma status post nephrectomy, CKD stage IIIa, diabetes, OSA, gout, morbid obesity, systolic heart failure due to NICM status post HM 3 VAD  placement on 99/24/2683 complicated by driveline site infection with MSSA in March 2023 presents for hospital follow-up of driveline infection.  On arrival he had a temp 100.3, WBC 11.9 K CT showed fluid around lungs suspicious for infection, started on vancomycin and cefepime.  Wound cultures from OSH grew MSSA.  During hospitalization he underwent I&D x 2 with wound VAC placement.  He was initially transition to cefadroxil for LVAD as a site infection.  Or cultures noted with no growth.  Patient discharged on cefazolin x 6 weeks EOT 12/25 transition to cefadroxil 500 mg p.o. twice daily for likely lifelong suppression 08/31/22: Pt reports he  feels well. No issues with tolerance. Wound vac in place. Denies missed doses.  Today 10/08/22: Started cefadroxil a couple days ago. IV abx stopped on 1225, PICC out. Denies fevers and chills.  Review of Systems  All other systems reviewed and are negative.   Past Medical History:  Diagnosis Date   Anxiety     Aspirin allergy    Childhood asthma    Chronic systolic CHF (congestive heart failure) ([place])    a. EF 20-25% in 2012. b. EF 45-50% in 10/2011 with nonischemic nuc - presumed NICM. c. 12/2014 Echo: Sev depressed LV fxn, sev dil LV, mild LVH, mild MR, sev dil LA, mildly reduced RV fxn.   CKD (chronic kidney disease) stage 2, GFR 60-89 ml/min    H/O vasectomy 12/2019   High cholesterol    Hypertension    Morbid obesity ([place])    Nephrolithiasis    OSA on CPAP    Paroxysmal atrial fibrillation ([place])    Presumed NICM    a. 04/2014 Myoview: EF 26%, glob HK, sev glob HK, ? prior infarct;  b. Never cathed 2/2 CKD.   Renal cell carcinoma ([place])    a. s/p Rt robotic assisted partial converted to radical nephrectomy on 01/2013.   Troponin level elevated    a. 04/2014, 12/2014: felt due to CHF.   Type II diabetes mellitus (HCC)    Ventricular tachycardia ([place])    a. appropriate ICD therapy 12/2017    Outpatient Medications Prior to Visit  Medication Sig Dispense Refill   Accu-Chek FastClix Lancets MISC USE AS DIRECTED FOUR TIMES DAILY 102 each 11   acetaminophen (TYLENOL) 325 MG tablet Take 2 tablets (650 mg total) by mouth every 6 (six) hours as needed for fever.     allopurinol (ZYLOPRIM) 100 MG tablet Take 1 tablet (100 mg total) by mouth daily. 90 tablet 3   amiodarone (PACERONE) 200 MG tablet Take 1 tablet (200 mg total) by mouth daily. 30 tablet 6   amLODipine (NORVASC) 10 MG tablet Take 1 tablet (10 mg total) by mouth daily 30 tablet 6   cefadroxil (DURICEF) 500 MG capsule Take 2 capsules (1,000 mg total) by mouth 2 (two) times daily. 120 capsule 2   colchicine 0.6 MG tablet Take 1 tablet (0.6 mg total) by mouth once daily. 30 tablet 6   diclofenac Sodium (VOLTAREN) 1 % GEL Apply 2 g topically 4 (four) times daily. (Patient taking differently: Apply 2 g topically 4 (four) times daily as needed (pain).) 100 g 1   docusate sodium (COLACE) 100 MG capsule Take 1 capsule (100 mg total) by mouth 2  (two) times daily. 10 capsule 0   doxazosin (CARDURA) 1 MG tablet Take 2 tablets (2 mg total) by mouth daily. 60 tablet 3   gabapentin (NEURONTIN) 400 MG capsule Take 1 capsule (400 mg total) by mouth 3 (three) times daily. 30 capsule 6   glucose blood test strip Use as directed 4 times daily. 100 each 11   HYDROcodone-acetaminophen (NORCO) 10-325 MG tablet Take 1 tablet by mouth every 6 (six) hours as needed for severe pain. 20 tablet 0   Insulin Pen Needle (TRUEPLUS 5-BEVEL PEN NEEDLES) 29G X 12.7MM MISC Use as directed in the morning, at noon, and at bedtime. 100 each 3   Insulin Syringe-Needle U-100 (ULTICARE INSULIN SYRINGE) 30G X 1/2" 1 ML MISC USE TO INJECT INSULIN TWICE DAILY. 100 each 11   isosorbide-hydrALAZINE (BIDIL) 20-37.5 MG tablet Take 2 tablets by mouth 3 (  three) times daily. 180 tablet 6   meclizine (ANTIVERT) 25 MG tablet Take 1 tablet (25 mg total) by mouth 3 (three) times daily as needed for dizziness. 90 tablet 2   metFORMIN (GLUCOPHAGE) 1000 MG tablet Take 1 tablet (1,000 mg total) by mouth 2 (two) times daily with a meal. 60 tablet 11   pantoprazole (PROTONIX) 40 MG tablet Take 1 tablet (40 mg total) by mouth once daily. 90 tablet 3   sacubitril-valsartan (ENTRESTO) 97-103 MG Take 1 tablet by mouth 2 (two) times daily. 60 tablet 6   Semaglutide, 2 MG/DOSE, (OZEMPIC, 2 MG/DOSE,) 8 MG/3ML SOPN Inject 2 mg into the skin once a week. 3 mL 11   sertraline (ZOLOFT) 100 MG tablet Take 1.5 tablets (150 mg total) by mouth daily. 45 tablet 6   spironolactone (ALDACTONE) 25 MG tablet Take 1 tablet (25 mg total) by mouth daily. 30 tablet 6   torsemide (DEMADEX) 20 MG tablet Take 1 tablet (20 mg total) by mouth daily as needed (If wt gain > 3 lb in 24 hr or > 5 lb in 1 week). 30 tablet 2   traMADol HCl 100 MG TABS Take 1 tablet (100 mg) by mouth every 6 (six) hours as needed for pain 90 tablet 0   traZODone (DESYREL) 100 MG tablet Take 1 tablet (100 mg total) by mouth at bedtime. 30  tablet 11   warfarin (COUMADIN) 4 MG tablet Take 2 tablet (8 mg) daily or as directed by HF clinic 90 tablet 11   Zinc Sulfate 220 (50 Zn) MG TABS Take 1 tablet (220 mg total) by mouth daily. 30 tablet 6   No facility-administered medications prior to visit.     Allergies  Allergen Reactions   Aspirin Shortness Of Breath, Itching and Rash     Burning sensation (Patient reports he tolerates other NSAIDS)    Bee Venom Hives and Swelling   Lisinopril Cough   Shrimp (Diagnostic) Rash    Social History   Tobacco Use   Smoking status: Former    Packs/day: 0.25    Years: 22.00    Total pack years: 5.50    Types: Cigarettes    Quit date: 04/30/2015    Years since quitting: 7.4   Smokeless tobacco: Never  Vaping Use   Vaping Use: Never used  Substance Use Topics   Alcohol use: Not Currently    Comment: none   Drug use: Yes    Types: Marijuana    Family History  Problem Relation Age of Onset   Hypertension Mother    Diabetes Maternal Aunt    Heart attack Neg Hx    Stroke Neg Hx    Colon cancer Neg Hx    Pancreatic cancer Neg Hx    Stomach cancer Neg Hx    Liver cancer Neg Hx    Esophageal cancer Neg Hx     Objective:  There were no vitals filed for this visit. There is no height or weight on file to calculate BMI.  Physical Exam Constitutional:      General: He is not in acute distress.    Appearance: He is normal weight. He is not toxic-appearing.  HENT:     Head: Normocephalic and atraumatic.     Right Ear: External ear normal.     Left Ear: External ear normal.     Nose: No congestion or rhinorrhea.     Mouth/Throat:     Mouth: Mucous membranes are moist.  Pharynx: Oropharynx is clear.  Eyes:     Extraocular Movements: Extraocular movements intact.     Conjunctiva/sclera: Conjunctivae normal.     Pupils: Pupils are equal, round, and reactive to light.  Cardiovascular:     Rate and Rhythm: Normal rate and regular rhythm.     Heart sounds: No murmur  heard.    No friction rub. No gallop.  Pulmonary:     Effort: Pulmonary effort is normal.     Breath sounds: Normal breath sounds.  Abdominal:     General: Abdomen is flat. Bowel sounds are normal.     Palpations: Abdomen is soft.  Musculoskeletal:        General: No swelling. Normal range of motion.     Cervical back: Normal range of motion and neck supple.  Skin:    General: Skin is warm and dry.  Neurological:     General: No focal deficit present.     Mental Status: He is oriented to person, place, and time.  Psychiatric:        Mood and Affect: Mood normal.   Abd wound appear clean  Lab Results: Lab Results  Component Value Date   WBC 5.8 09/11/2022   HGB 12.8 (L) 09/11/2022   HCT 38.1 (L) 09/11/2022   MCV 88.6 09/11/2022   PLT 213 09/11/2022    Lab Results  Component Value Date   CREATININE 1.41 (H) 09/11/2022   BUN 31 (H) 09/11/2022   NA 138 09/11/2022   K 4.0 09/11/2022   CL 111 09/11/2022   CO2 20 (L) 09/11/2022    Lab Results  Component Value Date   ALT 6 09/11/2022   AST 17 09/11/2022   ALKPHOS 45 09/11/2022   BILITOT 0.6 09/11/2022     Assessment & Plan:  46 YM with: #LVAD exit site infection #ICD #Hx of MSSA from LVAD exit site #NICM status post HM 3 VAD  placement on 07/21/2021 -Admitted with LVAD exit site infection.  Bedside wound cultures grew MSSA.  Patient started on ceftriaxone. CT showed fluid attenuation around DL in subq fat c/w drive line infection, edema in abdominal wall. Bedside wound Cx+ staph aureus.  Taken to the OR  on 11/8, 11/14 both with negative cultures for debridement with Dr. Prescott Gum.  Per OR note on 11/8 there was tunneled infection with excision of infected tissue including fascia.  Pocket of fluid was drained.  Per OR note on 11/14 there is debridement of tunnel, rectus sheath, no pocket of fluid noticed.  AFB and fungal cultures were added. - Completed cefazolin x 6 weeks EOT 12/25(PICC out). Transitioned to cefadroxil  500 mg p.o. twice daily for chronic suppression -Wound vac out and wound assessed at 10/08/22 visit, wound looks clean Plan: -Spoke with [facility] and something growing on AFB cx, result should finlize on 1/9 -Continue cefadroxil suppression for now -F/U pt on 1/11 or 1/16(based on apt availability) -labs today   Laurice Record, [facility] [place]  I have personally spent 68 minutes involved in face-to-face and non-face-to-face activities for this patient on the day of the visit. Professional time spent includes the following activities: Preparing to see the patient (review of tests), Obtaining and/or reviewing separately obtained history (admission/discharge record), Performing a medically appropriate examination and/or evaluation , Ordering medications/tests/procedures, referring and communicating with other health care professionals, Documenting clinical information in the EMR, Independently interpreting results (not separately reported), Communicating results to the patient/family/caregiver, Counseling and  educating the patient/family/caregiver and Care coordination (not separately reported).   10/08/22  11:03 AM

## 2022-10-08 NOTE — Progress Notes (Signed)
Pt presents to VAD clinic for wound care by himself. He states that his wound vac has been alarming on and off since yesterday.  Pt confirms he is taking Cefadroxil 1000 mg bid today.   Exit site care:  Existing dressing removed using sterile technique. Wound bed cleansed with hypochlorous spray and allowed to dry. Skin surrounding site cleansed with CHG swab x 2 and allowed to dry.  Exit site healing and incorporated, the velour is exposed 5 cm at exit site. Wound bed beefy red. Wound does not tunnel. Tenderness noted with cleansing and sponge change. Moderate amount of serosanguinous drainage. Hypochlorous soaked 4 x 4 placed in wound bed and covered with 4 x 4 gauze.  Anchor applied. Slight foul odor, no rash noted. Pt denies fever or chills. Plan for dressing change on Monday in [facility] clinic per Dr Prescott Gum. Pt has adequate dressing supplies at home.       Plan:  Return to VAD clinic on Monday for dressing change with Dr Lucianne Lei Deberah Castle RN,BSN [place] Coordinator  Office: [phone]  24/7 Pager: [phone]

## 2022-10-09 LAB — CBC WITH DIFFERENTIAL/PLATELET
Absolute Monocytes: 429 cells/uL (ref 200–950)
Basophils Absolute: 48 cells/uL (ref 0–200)
Basophils Relative: 0.9 %
Eosinophils Absolute: 217 cells/uL (ref 15–500)
Eosinophils Relative: 4.1 %
HCT: 40.8 % (ref 38.5–50.0)
Hemoglobin: 13.3 g/dL (ref 13.2–17.1)
Lymphs Abs: 1813 cells/uL (ref 850–3900)
MCH: 28.2 pg (ref 27.0–33.0)
MCHC: 32.6 g/dL (ref 32.0–36.0)
MCV: 86.4 fL (ref 80.0–100.0)
MPV: 12.4 fL (ref 7.5–12.5)
Monocytes Relative: 8.1 %
Neutro Abs: 2793 cells/uL (ref 1500–7800)
Neutrophils Relative %: 52.7 %
Platelets: 247 10*3/uL (ref 140–400)
RBC: 4.72 10*6/uL (ref 4.20–5.80)
RDW: 13 % (ref 11.0–15.0)
Total Lymphocyte: 34.2 %
WBC: 5.3 10*3/uL (ref 3.8–10.8)

## 2022-10-09 LAB — AEROBIC CULTURE W GRAM STAIN (SUPERFICIAL SPECIMEN)

## 2022-10-09 LAB — COMPREHENSIVE METABOLIC PANEL
AG Ratio: 1.2 (calc) (ref 1.0–2.5)
ALT: 12 U/L (ref 9–46)
AST: 19 U/L (ref 10–40)
Albumin: 4.1 g/dL (ref 3.6–5.1)
Alkaline phosphatase (APISO): 53 U/L (ref 36–130)
BUN: 25 mg/dL (ref 7–25)
CO2: 26 mmol/L (ref 20–32)
Calcium: 10.1 mg/dL (ref 8.6–10.3)
Chloride: 109 mmol/L (ref 98–110)
Creat: 1.26 mg/dL (ref 0.60–1.29)
Globulin: 3.3 g/dL (calc) (ref 1.9–3.7)
Glucose, Bld: 100 mg/dL — ABNORMAL HIGH (ref 65–99)
Potassium: 4.6 mmol/L (ref 3.5–5.3)
Sodium: 141 mmol/L (ref 135–146)
Total Bilirubin: 0.3 mg/dL (ref 0.2–1.2)
Total Protein: 7.4 g/dL (ref 6.1–8.1)

## 2022-10-09 LAB — C-REACTIVE PROTEIN: CRP: 3.2 mg/L (ref <8.0)

## 2022-10-09 LAB — SEDIMENTATION RATE: Sed Rate: 2 mm/h (ref 0–15)

## 2022-10-12 ENCOUNTER — Encounter: Payer: Self-pay | Admitting: Obstetrics and Gynecology

## 2022-10-12 ENCOUNTER — Ambulatory Visit (HOSPITAL_COMMUNITY)
Admission: RE | Admit: 2022-10-12 | Discharge: 2022-10-12 | Disposition: A | Discharge status: Home / Self Care | Payer: Medicaid Other | Source: Ambulatory Visit | Attending: Cardiology | Admitting: Cardiology

## 2022-10-12 ENCOUNTER — Other Ambulatory Visit: Payer: Medicaid Other | Admitting: Obstetrics and Gynecology

## 2022-10-12 ENCOUNTER — Other Ambulatory Visit: Payer: Self-pay

## 2022-10-12 DIAGNOSIS — T827XXA Infection and inflammatory reaction due to other cardiac and vascular devices, implants and grafts, initial encounter: Secondary | ICD-10-CM

## 2022-10-12 DIAGNOSIS — Z95811 Presence of heart assist device: Secondary | ICD-10-CM | POA: Diagnosis not present

## 2022-10-12 NOTE — Addendum Note (Signed)
Encounter addended by: Doren Custard, RN on: 10/12/2022 11:25 AM  Actions taken: Clinical Note Signed

## 2022-10-12 NOTE — Patient Outreach (Signed)
Care Coordination  10/12/2022  Jeffrey [name] 17-Dec-1975 [id number]   RNCM returned patient's call.  Patient states he needs letter stating he is currently not working emailed to [employer] at [employer] by Wednesday.  RNCM following up with Managed Medicaid Care Team regarding letter.  [name] RN, BSN   [facility] - Managed Medicaid High Risk [phone]

## 2022-10-12 NOTE — Progress Notes (Addendum)
Pt presents to VAD clinic for wound care by himself. He states that his wound vac has been alarming on and off since yesterday.  Pt confirms he is taking Cefadroxil 1000 mg bid today.   Exit site care:  Existing dressing removed using sterile technique. Wound bed cleansed with hypochlorous spray and allowed to dry. Skin surrounding site cleansed with CHG swab x 2 and allowed to dry.  Exit site healing and incorporated, the velour is exposed 5 cm at exit site. Wound bed beefy red. Wound does not tunnel. Tenderness noted with cleansing and sponge change. Moderate amount of serosanguinous drainage. Hypochlorous soaked 4 x 4 placed in wound bed and covered with 4 x 4 gauze.  Anchor applied. Slight foul odor, no rash noted. Pt denies fever or chills. Plan for dressing change on Wednesday in [facility] clinic per Dr Prescott Gum. Pt given 7 daily dressing kits today and asked to bring to clinic for dressing changes each visit.      Plan:  Return to VAD clinic on Wednesday for dressing change with Dr Lucianne Lei Lytle Michaels RN,BSN [place] Coordinator  Office: [phone]  24/7 Pager: [phone]

## 2022-10-13 ENCOUNTER — Encounter (HOSPITAL_COMMUNITY): Payer: Self-pay | Admitting: Unknown Physician Specialty

## 2022-10-13 ENCOUNTER — Other Ambulatory Visit: Payer: Medicaid Other

## 2022-10-13 ENCOUNTER — Telehealth (HOSPITAL_COMMUNITY): Payer: Self-pay | Admitting: Licensed Clinical Social Worker

## 2022-10-13 NOTE — Telephone Encounter (Signed)
H&V Care Navigation CSW Progress Note  Clinical Social Worker contacted patient by phone to assist with requested letter.  Patient is participating in a Managed Medicaid Plan:  Yes  Patient states he is in need of a letter confirming he is unable to work at this time for the [employer] program. CSW assisted by Tanda Rockers, VAD Coordinator completed letter and patient will pick up later today. Raquel Sarna, [place], [place]   SDOH Screenings   Food Insecurity: No Food Insecurity (08/13/2022)  Housing: Low Risk  (08/13/2022)  Transportation Needs: No Transportation Needs (08/13/2022)  Utilities: Not At Risk (08/13/2022)  Alcohol Screen: Low Risk  (10/12/2022)  Depression (PHQ2-9): Low Risk  (10/08/2022)  Financial Resource Strain: Medium Risk (10/02/2022)  Physical Activity: Sufficiently Active (07/30/2022)  Social Connections: Moderately Integrated (10/12/2022)  Stress: Stress Concern Present (10/02/2022)  Tobacco Use: Medium Risk (10/12/2022)

## 2022-10-13 NOTE — Patient Outreach (Signed)
Medicaid Managed Care Social Work Note  10/13/2022 Name:  Jeffrey [name] MRN:  [id number] DOB:  03/23/1976  Jeffrey [name] is an 47 y.o. year old male who is a primary patient of [place], Jeffrey Church I, NP.  The [facility] Managed Care Coordination team was consulted for assistance with:   letter needed for being out of work  [name] was given information about Medicaid Managed Care Coordination team services today. Jeffrey [name] Patient agreed to services and verbal consent obtained.  Engaged with patient  for by telephone forfollow up visit in response to referral for case management and/or care coordination services.   Assessments/Interventions:  Review of past medical history, allergies, medications, health status, including review of consultants reports, laboratory and other test data, was performed as part of comprehensive evaluation and provision of chronic care management services.  SDOH: (Social Determinant of Health) assessments and interventions performed: SDOH Interventions    Flowsheet Row Patient Outreach Telephone from 10/12/2022 in [place] Patient Outreach Telephone from 10/02/2022 in [place] Patient Outreach Telephone from 07/30/2022 in [place] Patient Outreach Telephone from 05/18/2022 in [place] Patient Outreach Telephone from 02/13/2022 in [place] Patient Outreach Telephone from 01/12/2022 in [place] Interventions        Food Insecurity Interventions -- -- -- -- -- Other (Comment)  [applied for food stamps-has not heard-is going to DSS office to follow up]  Housing Interventions -- -- -- -- -- Intervention Not Indicated  Transportation Interventions -- -- -- -- Other (Comment)  [Insurance Plan transportation available to patient] --  Utilities  Interventions -- -- Intervention Not Indicated -- -- --  Alcohol Usage Interventions Intervention Not Indicated (Score <7) -- Intervention Not Indicated (Score <7) -- -- --  Financial Strain Interventions -- Other (Comment)  [referral placed] -- -- -- --  Physical Activity Interventions -- -- Intervention Not Indicated -- -- --  Stress Interventions -- Other (Comment)  [referral placed for BSW-patient [facility]] -- Intervention Not Indicated -- --     BSW completed a telephone outreach with patient regarding a letter stating that he is out of work. Patient stated his PCP office did call him back and stated they would provide the letter. Patient stated no other services or resources or needed at this time.  Advanced Directives Status:  Not addressed in this encounter.  Care Plan                 Allergies  Allergen Reactions   Aspirin Shortness Of Breath, Itching and Rash     Burning sensation (Patient reports he tolerates other NSAIDS)    Bee Venom Hives and Swelling   Lisinopril Cough   Shrimp (Diagnostic) Rash    Medications Reviewed Today     Reviewed by Gayla Medicus, RN (Registered Nurse) on 10/12/22 at 1637  Med List Status: <None>   Medication Order Taking? Sig Documenting Provider Last Dose Status Informant  Accu-Chek FastClix Lancets MISC [id number] No USE AS DIRECTED FOUR TIMES DAILY Bensimhon, Shaune Pascal, MD Taking Active   acetaminophen (TYLENOL) 325 MG tablet [id number] No Take 2 tablets (650 mg total) by mouth every 6 (six) hours as needed for fever. Conrad Lafe, NP Taking Active Self, Pharmacy Records  allopurinol (ZYLOPRIM) 100 MG tablet [id number] No Take 1 tablet (100 mg  total) by mouth daily. Bensimhon, Shaune Pascal, MD Taking Active Self, Pharmacy Records  amiodarone (PACERONE) 200 MG tablet [id number] No Take 1 tablet (200 mg total) by mouth daily. Bensimhon, Shaune Pascal, MD Taking Active Self, Pharmacy Records  amLODipine (NORVASC) 10 MG tablet  [id number] No Take 1 tablet (10 mg total) by mouth daily Bensimhon, Shaune Pascal, MD Taking Active Self, Pharmacy Records  cefadroxil (DURICEF) 500 MG capsule [id number] No Take 2 capsules (1,000 mg total) by mouth 2 (two) times daily. Dahlia Byes, MD Taking Active   colchicine 0.6 MG tablet [id number] No Take 1 tablet (0.6 mg total) by mouth once daily. Bensimhon, Shaune Pascal, MD Taking Active Self, Pharmacy Records  diclofenac Sodium (VOLTAREN) 1 % GEL [id number] No Apply 2 g topically 4 (four) times daily.  Patient taking differently: Apply 2 g topically 4 (four) times daily as needed (pain).   Izora Ribas, MD Taking Active Self, Pharmacy Records  docusate sodium (COLACE) 100 MG capsule [id number] No Take 1 capsule (100 mg total) by mouth 2 (two) times daily. Darrick Grinder D, NP Taking Active Self, Pharmacy Records  doxazosin (CARDURA) 1 MG tablet [id number] No Take 2 tablets (2 mg total) by mouth daily. Bensimhon, Shaune Pascal, MD Taking Active   gabapentin (NEURONTIN) 400 MG capsule [id number] No Take 1 capsule (400 mg total) by mouth 3 (three) times daily. Bensimhon, Shaune Pascal, MD Taking Active   glucose blood test strip [id number] No Use as directed 4 times daily. Bo Merino I, NP Taking Active Self, Pharmacy Records  HYDROcodone-acetaminophen [facility]) 10-325 MG tablet [id number] No Take 1 tablet by mouth every 6 (six) hours as needed for severe pain. Bensimhon, Shaune Pascal, MD Taking Active   Insulin Pen Needle (TRUEPLUS 5-BEVEL PEN NEEDLES) 29G X 12.7MM MISC [id number] No Use as directed in the morning, at noon, and at bedtime. Teena Dunk, NP Taking Active Self, Pharmacy Records  Insulin Syringe-Needle U-100 (ULTICARE INSULIN SYRINGE) 30G X 1/2" 1 ML MISC [id number] No USE TO INJECT INSULIN TWICE DAILY. Bensimhon, Shaune Pascal, MD Taking Active   isosorbide-hydrALAZINE (BIDIL) 20-37.5 MG tablet [id number] No Take 2 tablets by mouth 3 (three) times daily. Bensimhon, Shaune Pascal, MD Taking Active   meclizine  (ANTIVERT) 25 MG tablet [id number] No Take 1 tablet (25 mg total) by mouth 3 (three) times daily as needed for dizziness. Bensimhon, Shaune Pascal, MD Taking Active Self, Pharmacy Records  metFORMIN (GLUCOPHAGE) 1000 MG tablet [id number] No Take 1 tablet (1,000 mg total) by mouth 2 (two) times daily with a meal. Bensimhon, Shaune Pascal, MD Taking Active   pantoprazole (PROTONIX) 40 MG tablet [id number] No Take 1 tablet (40 mg total) by mouth once daily. Bensimhon, Shaune Pascal, MD Taking Active Self, Pharmacy Records  sacubitril-valsartan [facility]) 97-103 MG [id number] No Take 1 tablet by mouth 2 (two) times daily. Bensimhon, Shaune Pascal, MD Taking Active Self, Pharmacy Records  Semaglutide, 2 MG/DOSE, (OZEMPIC, 2 MG/DOSE,) 8 MG/3ML SOPN [id number] No Inject 2 mg into the skin once a week. Bensimhon, Shaune Pascal, MD Taking Active Self, Pharmacy Records  sertraline (ZOLOFT) 100 MG tablet [id number] No Take 1.5 tablets (150 mg total) by mouth daily. Bensimhon, Shaune Pascal, MD Taking Active Self, Pharmacy Records  spironolactone (ALDACTONE) 25 MG tablet [id number] No Take 1 tablet (25 mg total) by mouth daily. Bensimhon, Shaune Pascal, MD Taking Active   torsemide (DEMADEX) 20 MG tablet [id number] No Take 1 tablet (20 mg total) by mouth daily as needed (If wt gain > 3 lb  in 24 hr or > 5 lb in 1 week). Fenton Foy, NP Taking Active Self, Pharmacy Records  traMADol HCl 100 MG TABS [id number] No Take 1 tablet (100 mg) by mouth every 6 (six) hours as needed for pain Bensimhon, Shaune Pascal, MD Taking Active   traZODone (DESYREL) 100 MG tablet [id number] No Take 1 tablet (100 mg total) by mouth at bedtime. Bensimhon, Shaune Pascal, MD Taking Active Self, Pharmacy Records  warfarin (COUMADIN) 4 MG tablet [id number] No Take 2 tablet (8 mg) daily or as directed by HF clinic Bensimhon, Shaune Pascal, MD Taking Active            Med Note Orma Render   Mon Sep 21, 2022 11:44 AM) 4 mg every Monday and 8 mg all other days  Zinc Sulfate 220 (50 Zn) MG TABS  [id number] No Take 1 tablet (220 mg total) by mouth daily. 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[place]) 08/10/2022   Presence of left ventricular assist device (LVAD) ([place]) 07/25/2021   Acute on chronic systolic CHF (congestive heart failure) ([place]) 07/18/2021   Cardiogenic shock (HCC)    Atrial fibrillation with RVR ([place]) 04/30/2021   Acute hypoxemic respiratory failure (HCC)    Right wrist pain 04/16/2021   Absolute anemia    Rectal bleeding    Iron deficiency 12/25/2020   Long term current use of anticoagulant - apixaban 12/25/2020   Paroxysmal atrial fibrillation (HCC)    Lactic acidosis 11/22/2020   Hypotension 11/22/2020   Aspiration into airway    ICD (implantable cardioverter-defibrillator) battery depletion 05/15/2020   Endotracheal tube present    Respiratory failure ([place])    Acute encephalopathy    Acute pulmonary edema (HCC)    NICM (nonischemic cardiomyopathy) ([place]) 04/24/2020   S/P vasectomy 12/20/2019   Anxiety 12/20/2019   Hemoglobin A1C between 7% and 9% indicating borderline diabetic control ([place]) 12/20/2019   Seasonal allergies 12/20/2019   Insomnia 12/20/2019   Vasectomy evaluation 06/20/2019   Visual problems 06/20/2019   Blurry vision, bilateral 06/20/2019   Hyperglycemia 02/13/2019   Hyperosmolar (nonketotic) coma ([place]) 01/19/2019   AICD (automatic cardioverter/defibrillator) present 01/19/2019   Hyperlipidemia 12/07/2018   Follow-up exam 11/22/2018   Class 3 severe obesity due to excess calories with serious comorbidity and body mass index (BMI) of 45.0 to 49.9 in adult ([place]) 11/22/2018   Dizziness 11/22/2018   Lingular pneumonia 08/04/2018   Ventricular tachycardia ([place]) 12/30/2017   PAF (paroxysmal atrial fibrillation) ([place])    Dilated cardiomyopathy ([place])    Pollen allergy 02/17/2017   Dyslipidemia  02/17/2017   Hematochezia 11/08/2015   CKD (chronic kidney disease), stage III ([place]) 06/20/2015   Cardiac defibrillator in situ    Chronic systolic CHF (congestive heart failure) ([place]) 03/11/2015   Gouty arthritis 03/11/2015   Chest pain 12/28/2014   Acute renal failure superimposed on stage 3 chronic kidney disease ([place]) 04/08/2014   Aspirin allergy 04/08/2014   Renal cell carcinoma ([place])    Gout attack 11/10/2011   Morbid obesity ([place]) 10/23/2011   OSA (obstructive sleep apnea) 10/23/2011   Type 2 diabetes mellitus with stage 3 chronic kidney disease, with long-term current use of insulin ([place]) 10/22/2011   Hyperlipidemia associated with type 2 diabetes mellitus ([place]) 12/25/2010   Hypertension associated with diabetes ([place]) 12/25/2010  Conditions to be addressed/monitored per PCP order:   letter for being out of work  There are no care plans that you recently modified to display for this patient.   Follow up:  Patient agrees to Care Plan and Follow-up.  Plan: The Managed Medicaid care management team will reach out to the patient again over the next 30 days.  Date/time of next scheduled Social Work care management/care coordination outreach:  11/06/22  [name], Arita Miss, [place] Medicaid Team  [phone]

## 2022-10-13 NOTE — Patient Instructions (Signed)
Visit Information  Jeffrey [name] was given information about Medicaid Managed Care [name] care coordination services as a part of their Healthy [facility] Medicaid benefit. Jeffrey [name] verbally consented to engagement with the [facility] Managed Care [name].   If you are experiencing a medical emergency, please call 911 or report to your local emergency department or urgent care.   If you have a non-emergency medical problem during routine business hours, please contact your provider's office and ask to speak with a nurse.   For questions related to your Healthy [facility] health plan, please call: [phone] or visit the homepage here: [email or web address]  If you would like to schedule transportation through your Healthy [facility] plan, please call the following number at least 2 days in advance of your appointment: [phone]  For information about your ride after you set it up, call Ride Assist at [phone]. Use this number to activate a Will Call pickup, or if your transportation is late for a scheduled pickup. Use this number, too, if you need to make a change or cancel a previously scheduled reservation.  If you need transportation services right away, call [phone]. The after-hours call center is staffed 24 hours to handle ride assistance and urgent reservation requests (including discharges) 365 days a year. Urgent trips include sick visits, hospital discharge requests and life-sustaining treatment.  Call the [place] at [phone], at any time, 24 hours a day, 7 days a week. If you are in danger or need immediate medical attention call 911.  If you would like help to quit smoking, call 1-800-QUIT-NOW 628 190 5505) OR Espaol: 1-855-Djelo-Ya (0-539-767-3419) o para ms informacin haga clic aqu or Text READY to 200-400 to register via text  Jeffrey [name] - following are the goals we discussed in your visit today:   Goals  Addressed   None      Social Worker will follow up on 11/06/22.   Jeffrey [name], Jeffrey [name], Jeffrey [name]  [phone]   Following is a copy of your plan of care:  There are no care plans that you recently modified to display for this patient.

## 2022-10-14 ENCOUNTER — Ambulatory Visit (HOSPITAL_COMMUNITY)
Admission: RE | Admit: 2022-10-14 | Discharge: 2022-10-14 | Disposition: A | Discharge status: Home / Self Care | Payer: Medicaid Other | Source: Ambulatory Visit | Attending: Cardiology | Admitting: Cardiology

## 2022-10-14 DIAGNOSIS — Z4801 Encounter for change or removal of surgical wound dressing: Secondary | ICD-10-CM | POA: Diagnosis not present

## 2022-10-14 DIAGNOSIS — T827XXA Infection and inflammatory reaction due to other cardiac and vascular devices, implants and grafts, initial encounter: Secondary | ICD-10-CM

## 2022-10-14 LAB — ACID FAST CULTURE WITH REFLEXED SENSITIVITIES (MYCOBACTERIA): Acid Fast Culture: NEGATIVE

## 2022-10-14 NOTE — Progress Notes (Signed)
Pt presents to [facility] clinic for wound care by himself. He states that his wound vac has been alarming on and off since yesterday.  Pt confirms he is taking Cefadroxil 1000 mg bid today.   Exit site care:  Existing dressing removed using sterile technique. Wound bed cleansed with hypochlorous spray and allowed to dry. Skin surrounding site cleansed with CHG swab x 2 and allowed to dry.  Exit site healing and incorporated, the velour is exposed 5 cm at exit site. Wound bed beefy red. Wound does not tunnel. Tenderness noted with cleansing and sponge change. Moderate amount of serosanguinous drainage. Hypochlorous soaked 4 x 4 placed in wound bed and covered with 4 x 4 gauze. Anchor applied. Slight foul odor, no rash noted. Pt denies fever or chills. Plan for dressing change on Monday in [place] clinic per Dr Prescott Gum with daily dressing changes at home.Spoke with [name] to give dressing change instructions. Sterile hypochlorous soaked 2x2's given to pt for at home use.      Plan:  Return to VAD clinic on Monday for dressing change with Dr Prescott Gum and 2 month follow up with Dr. [name]. Daily dressing changes at home with hypochlorous soaked 2x2 placed in wound bed.  Bobbye Morton RN,BSN [place] Coordinator  Office: [phone]  24/7 Pager: [phone]

## 2022-10-14 NOTE — Addendum Note (Signed)
Encounter addended by: Doren Custard, RN on: 10/14/2022 9:57 AM  Actions taken: Clinical Note Signed

## 2022-10-14 NOTE — Addendum Note (Signed)
Encounter addended by: Micki Riley, RN on: 10/14/2022 11:56 AM  Actions taken: Imaging Exam ended

## 2022-10-16 ENCOUNTER — Encounter (HOSPITAL_COMMUNITY): Payer: Self-pay

## 2022-10-16 ENCOUNTER — Other Ambulatory Visit: Payer: Self-pay

## 2022-10-16 ENCOUNTER — Other Ambulatory Visit (HOSPITAL_COMMUNITY): Payer: Self-pay

## 2022-10-16 DIAGNOSIS — Z95811 Presence of heart assist device: Secondary | ICD-10-CM

## 2022-10-16 DIAGNOSIS — Z7901 Long term (current) use of anticoagulants: Secondary | ICD-10-CM

## 2022-10-16 NOTE — Progress Notes (Signed)
Pt called and asked to bring equipment for annual maintenance at next appointment on 10/19/22. Pt confirmed.  Bobbye Morton RN, BSN [facility]AD Coordinator 24/7 Pager [phone]

## 2022-10-19 ENCOUNTER — Encounter (HOSPITAL_COMMUNITY): Payer: Medicaid Other

## 2022-10-19 ENCOUNTER — Ambulatory Visit (HOSPITAL_COMMUNITY): Payer: Self-pay | Admitting: Pharmacist

## 2022-10-19 ENCOUNTER — Ambulatory Visit (HOSPITAL_COMMUNITY)
Admission: RE | Admit: 2022-10-19 | Discharge: 2022-10-19 | Disposition: A | Discharge status: Home / Self Care | Payer: Medicaid Other | Source: Ambulatory Visit | Attending: Internal Medicine | Admitting: Internal Medicine

## 2022-10-19 VITALS — BP 92/0 | HR 73 | Ht 67.0 in | Wt 292.4 lb

## 2022-10-19 DIAGNOSIS — N1831 Chronic kidney disease, stage 3a: Secondary | ICD-10-CM

## 2022-10-19 DIAGNOSIS — Z95811 Presence of heart assist device: Secondary | ICD-10-CM

## 2022-10-19 DIAGNOSIS — I5022 Chronic systolic (congestive) heart failure: Secondary | ICD-10-CM

## 2022-10-19 DIAGNOSIS — Z7901 Long term (current) use of anticoagulants: Secondary | ICD-10-CM | POA: Diagnosis not present

## 2022-10-19 DIAGNOSIS — Z4801 Encounter for change or removal of surgical wound dressing: Secondary | ICD-10-CM | POA: Insufficient documentation

## 2022-10-19 DIAGNOSIS — T827XXA Infection and inflammatory reaction due to other cardiac and vascular devices, implants and grafts, initial encounter: Secondary | ICD-10-CM

## 2022-10-19 DIAGNOSIS — I493 Ventricular premature depolarization: Secondary | ICD-10-CM | POA: Diagnosis not present

## 2022-10-19 DIAGNOSIS — I152 Hypertension secondary to endocrine disorders: Secondary | ICD-10-CM

## 2022-10-19 DIAGNOSIS — E1159 Type 2 diabetes mellitus with other circulatory complications: Secondary | ICD-10-CM

## 2022-10-19 LAB — PROTIME-INR
INR: 1.8 — ABNORMAL HIGH (ref 0.8–1.2)
Prothrombin Time: 21 seconds — ABNORMAL HIGH (ref 11.4–15.2)

## 2022-10-19 LAB — CBC
HCT: 42.3 % (ref 39.0–52.0)
Hemoglobin: 13.9 g/dL (ref 13.0–17.0)
MCH: 28.7 pg (ref 26.0–34.0)
MCHC: 32.9 g/dL (ref 30.0–36.0)
MCV: 87.4 fL (ref 80.0–100.0)
Platelets: 244 10*3/uL (ref 150–400)
RBC: 4.84 MIL/uL (ref 4.22–5.81)
RDW: 14.2 % (ref 11.5–15.5)
WBC: 7 10*3/uL (ref 4.0–10.5)
nRBC: 0 % (ref 0.0–0.2)

## 2022-10-19 LAB — BASIC METABOLIC PANEL
Anion gap: 9 (ref 5–15)
BUN: 20 mg/dL (ref 6–20)
CO2: 21 mmol/L — ABNORMAL LOW (ref 22–32)
Calcium: 9.6 mg/dL (ref 8.9–10.3)
Chloride: 107 mmol/L (ref 98–111)
Creatinine, Ser: 0.92 mg/dL (ref 0.61–1.24)
GFR, Estimated: >60 mL/min (ref >60)
Glucose, Bld: 133 mg/dL — ABNORMAL HIGH (ref 70–99)
Potassium: 4.4 mmol/L (ref 3.5–5.1)
Sodium: 137 mmol/L (ref 135–145)

## 2022-10-19 LAB — LACTATE DEHYDROGENASE: LDH: 139 U/L (ref 98–192)

## 2022-10-19 NOTE — Progress Notes (Signed)
Patient presents for 1 mo f/u up in [facility] today for annual maintenance w/his wife [name]. Reports no problem with VAD or equipment.  Pt currently taking cefadroxil 1000 mg PO bid for chronic suppression. Pt followed closely in [facility] for weekly wound vac dressing changes with Dr.Vantrigt on Monday's.  Pt states he is overall doing well. Denies falls, shortness of breath, heart failure symptoms or signs of bleeding.  Dr. [name] reveiwed ZIO patch results in clinic. Will continue current plan of care.  Pt is asking if he can restart Ozempic both Dr [name] and Dr Prescott Gum are in agreement that pt may restart.   Dr Prescott Gum in to perform dressing change. See below for full documentation.  Vital Signs:  Doppler Pressure: 92 Automatc BP:  123/77 (92) HR:  73 SPO2: 97% on RA   Weight: 292.4 lb w/ eqt Last weight:  296 lbs w/eqt   VAD Indication: Destination Therapy due to smoking and BMI   VAD interrogation & Equipment Management: Speed: 6200 Flow: 5.5 Power: 5.3w    PI: 2.6 Hct: 38   Alarms: no clinical alarms Events: 10 daily  Fixed speed 6200 Low speed limit: 5900   Primary Controller:  Replace back up battery in 15 months. Back up controller:   Replace back up battery in 21 months.   Annual Equipment Maintenance on UBC/PM was performed on 10/19/22.    I reviewed the LVAD parameters from today and compared the results to the patient's prior recorded data. LVAD interrogation was NEGATIVE for significant power changes, NEGATIVE for clinical alarms and POSITIVE for PI events/speed drops. No programming changes were made and pump is functioning within specified parameters. Pt is performing daily controller and system monitor self tests along with completing weekly and monthly maintenance for LVAD equipment.   LVAD equipment check completed and is in good working order. Back-up equipment present.    Exit Site Care: Drive line being maintained by VAD Coordinators  and Dr. Darcey Nora. Existing dressing removed using sterile technique. Wound bed cleansed with hypochlorous spray and allowed to dry. Skin surrounding site cleansed with CHG swab x 2 and allowed to dry.  Exit site healing and incorporated, the velour is exposed 5 cm at exit site. Wound bed beefy red. Wound does not tunnel. No tenderness or rash. Scant amount of serous drainage. Hypochlorous soaked 4 x 4 placed in wound bed and covered with 4 x 4 gauze. Anchor applied x 2. Pt denies fever or chills. Plan for dressing change on Monday in [place] clinic per Dr Prescott Gum with daily dressing changes at home. Sterile hypochlorous soaked 2x2's given to pt for at home use along with 7 daily dressing kits.        Device:  Left subcutaneous dual ICD Therapies: on at 250 BPM Last check: 03/04/22  BP & Labs:  Doppler 92 - Doppler is reflecting MAP   Hgb 13.9 - No S/S of bleeding. Specifically denies melena/BRBPR or nosebleeds.   LDH stable at 139 with established baseline of 160 - 300. Denies tea-colored urine. No power elevations noted on interrogation.    Plan:  Restart Ozempic Return to clinic Monday for dressing change with Dr. Darcey Nora Return to clinic in 2 month for follow up   Tanda Rockers RN,BSN [place] Coordinator  Office: [phone]  24/7 Pager: [phone]

## 2022-10-19 NOTE — Patient Instructions (Signed)
Restart Ozempic Return to clinic Monday for dressing change with Dr. Darcey Nora Return to clinic in 2 month for follow up

## 2022-10-20 ENCOUNTER — Other Ambulatory Visit: Payer: Self-pay

## 2022-10-21 ENCOUNTER — Other Ambulatory Visit (HOSPITAL_COMMUNITY): Payer: Medicaid Other

## 2022-10-24 NOTE — Progress Notes (Signed)
VAD CLINIC Follow-up Visit  PCP: Fenton Foy, NP HF MD: DB   HPI:  Jeffrey [name] is a 47 y.o. male with a history of  poorly controlled HTN, R renal cell carcinoma s/p nephrectomy, CKD IIIa, DM2, OSA, gout, morbid obesity and systolic HF due to NICM. 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 D/c'd home with plan for 6 weeks IV Ancef with plan to transition to cefadroxil for long-term suppression on 09/28/22.   Here for f/u. On cefadroxil for chronic DL infection. Had dressing change this am with Dr. Prescott Gum. Site much improved. Remains active. No SOB, edema, orthopnea or PND. Wants to restart Ozempic. Denies orthopnea or PND. No fevers, chills or problems with driveline. No bleeding, melena or neuro symptoms. No VAD alarms. Taking all meds as prescribed.    VAD Indication: Destination Therapy due to smoking and BMI   VAD interrogation & Equipment Management: Speed: 6200 Flow: 5.5 Power: 5.3w    PI: 2.6 Hct: 38   Alarms: no clinical alarms Events: 10 daily  Fixed speed 6200 Low speed limit: 5900   Primary Controller:  Replace back up battery in 15 months. Back up controller:   Replace back up battery in 21 months.   Annual Equipment Maintenance on UBC/PM was performed on 10/19/22.    I reviewed the LVAD parameters from today and compared the results to the patient's prior recorded data. LVAD interrogation was NEGATIVE for significant power changes, NEGATIVE for clinical alarms and POSITIVE for PI events/speed drops. No programming changes were made and pump is functioning within specified parameters. Pt is performing daily controller and system monitor self tests along with completing weekly and monthly maintenance for LVAD equipment.   LVAD equipment check completed and is in good working order. Back-up equipment present.   Follow up for Heart Failure/LVAD:  Here for routine VAD f/u.  Continues on IV abx. Doing well. No fevers or chills. Wound vac working well. Stil with some pain at wound vac site. No drainage. Has intermittent dizziness and palpitations. Denies orthopnea or PND.  No bleeding, melena or neuro symptoms. No VAD alarms. Taking all meds as prescribed.      Past Medical History:  Diagnosis Date   Anxiety    Aspirin allergy    Childhood asthma    Chronic systolic CHF (congestive heart failure) ([place])    a. EF 20-25% in 2012. b. EF 45-50% in 10/2011 with nonischemic nuc - presumed NICM. c. 12/2014 Echo: Sev depressed LV fxn, sev dil LV, mild LVH, mild MR, sev dil LA, mildly reduced RV fxn.   CKD (chronic kidney disease) stage 2, GFR 60-89 ml/min    H/O vasectomy 12/2019   High cholesterol    Hypertension    Morbid obesity ([place])    Nephrolithiasis    OSA on CPAP    Paroxysmal atrial fibrillation ([place])    Presumed NICM    a. 04/2014 Myoview: EF 26%, glob HK, sev glob HK, ? prior infarct;  b. Never cathed 2/2 CKD.   Renal cell carcinoma ([place])    a. s/p Rt robotic assisted partial converted to radical nephrectomy on 01/2013.   Troponin level elevated    a. 04/2014, 12/2014: felt due to CHF.   Type II diabetes mellitus ([facility])    Ventricular tachycardia ([place])    a. appropriate ICD therapy 12/2017    Current Outpatient Medications  Medication Sig Dispense Refill   acetaminophen (TYLENOL) 325 MG tablet Take 2 tablets (650 mg total) by mouth every 6 (six) hours as needed for fever.     allopurinol (ZYLOPRIM) 100 MG tablet Take 1 tablet (100 mg total) by mouth daily. 90 tablet 3   amiodarone (PACERONE) 200 MG tablet Take 1 tablet (200 mg total) by mouth daily. 30 tablet 6   amLODipine (NORVASC) 10 MG tablet Take 1 tablet (10 mg total) by mouth daily 30 tablet 6   cefadroxil (DURICEF) 500 MG capsule Take 2 capsules (1,000 mg total) by mouth 2 (two) times daily. 120 capsule 2   colchicine 0.6 MG tablet Take 1 tablet (0.6 mg total) by mouth once daily. 30 tablet 6    diclofenac Sodium (VOLTAREN) 1 % GEL Apply 2 g topically 4 (four) times daily. (Patient taking differently: Apply 2 g topically 4 (four) times daily as needed (pain).) 100 g 1   doxazosin (CARDURA) 1 MG tablet Take 2 tablets (2 mg total) by mouth daily. 60 tablet 3   gabapentin (NEURONTIN) 400 MG capsule Take 1 capsule (400 mg total) by mouth 3 (three) times daily. 30 capsule 6   glucose blood test strip Use as directed 4 times daily. 100 each 11   Insulin Pen Needle (TRUEPLUS 5-BEVEL PEN NEEDLES) 29G X 12.7MM MISC Use as directed in the morning, at noon, and at bedtime. 100 each 3   Insulin Syringe-Needle U-100 (ULTICARE INSULIN SYRINGE) 30G X 1/2" 1 ML MISC USE TO INJECT INSULIN TWICE DAILY. 100 each 11   isosorbide-hydrALAZINE (BIDIL) 20-37.5 MG tablet Take 2 tablets by mouth 3 (three) times daily. 180 tablet 6   metFORMIN (GLUCOPHAGE) 1000 MG tablet Take 1 tablet (1,000 mg total) by mouth 2 (two) times daily with a meal. 60 tablet 11   pantoprazole (PROTONIX) 40 MG tablet Take 1 tablet (40 mg total) by mouth once daily. 90 tablet 3   sacubitril-valsartan (ENTRESTO) 97-103 MG Take 1 tablet by mouth 2 (two) times daily. 60 tablet 6   sertraline (ZOLOFT) 100 MG tablet Take 1.5 tablets (150 mg total) by mouth daily. 45 tablet 6   spironolactone (ALDACTONE) 25 MG tablet Take 1 tablet (25 mg total) by mouth daily. 30 tablet 6   traMADol HCl 100 MG TABS Take 1 tablet (100 mg) by mouth every 6 (six) hours as needed for pain 90 tablet 0   traZODone (DESYREL) 100 MG tablet Take 1 tablet (100 mg total) by mouth at bedtime. 30 tablet 11   warfarin (COUMADIN) 4 MG tablet Take 2 tablet (8 mg) daily or as directed by HF clinic 90 tablet 11   Zinc Sulfate 220 (50 Zn) MG TABS Take 1 tablet (220 mg total) by mouth daily. 30 tablet 6   Accu-Chek FastClix Lancets MISC USE AS DIRECTED FOUR TIMES DAILY (Patient not taking: Reported on 10/19/2022) 102 each 11   docusate sodium (COLACE) 100 MG capsule Take 1 capsule  (100 mg total) by mouth 2 (two) times daily. (Patient not taking: Reported on 10/19/2022) 10 capsule 0   HYDROcodone-acetaminophen (NORCO) 10-325 MG tablet Take 1 tablet by mouth every 6 (six) hours as needed for severe pain. (Patient not taking: Reported on 10/19/2022) 20 tablet 0   meclizine (ANTIVERT) 25 MG tablet Take 1 tablet (25 mg total) by mouth 3 (three) times daily as needed for dizziness. (Patient not taking: Reported on 10/19/2022) 90 tablet 2   Semaglutide, 2 MG/DOSE, (OZEMPIC, 2 MG/DOSE,) 8 MG/3ML SOPN Inject  2 mg into the skin once a week. (Patient not taking: Reported on 10/19/2022) 3 mL 11   torsemide (DEMADEX) 20 MG tablet Take 1 tablet (20 mg total) by mouth daily as needed (If wt gain > 3 lb in 24 hr or > 5 lb in 1 week). (Patient not taking: Reported on 10/19/2022) 30 tablet 2   No current facility-administered medications for this encounter.    Aspirin, Bee venom, Lisinopril, and Shrimp (diagnostic)  REVIEW OF SYSTEMS: All systems negative except as listed in HPI, PMH and Problem list.   Vitals:   10/19/22 1452 10/19/22 1453  BP: 123/77 (!) 92/0  Pulse: 73   SpO2: 97%   Weight: 132.6 kg (292 lb 6.4 oz)   Height: '5\' 7"'$  (1.702 m)    Vital Signs:  Doppler Pressure: 92 Automatc BP:  123/77 (92) HR:  73 SPO2: 97% on RA   Weight: 292.4 lb w/ eqt Last weight:  296 lbs w/eqt   Exam: General:  NAD.  HEENT: normal  Neck: supple. JVP not elevated.  Carotids 2+ bilat; no bruits. No lymphadenopathy or thryomegaly appreciated. Cor: LVAD hum.  Lungs: Clear. Abdomen: obese soft, nontender, non-distended. No hepatosplenomegaly. No bruits or masses. Good bowel sounds. Driveline site clean. Dressing ok. Anchor in place.  Extremities: no cyanosis, clubbing, rash. Warm no edema  Neuro: alert & oriented x 3. No focal deficits. Moves all 4 without problem    ASSESSMENT AND PLAN:  .1. Chronic systolic CHF - Nonischemic CM, end-stage - EF 2012; 20-25%  - Echo  8/22: EF < 20%,  severe LV dilation, moderate RV dysfunction.   - RHC (8/22): well compensated hemodynamics. - s/p S-ICD - S/p HM3 LVAD 10/17 (DT) - Doing well NYHA I - Volume status looks good. - Continue Bidil 2 tabs tid - Continue spironolactone 25 mg daily  - Continue entresto to 97/130 bid - Stressed need to continue weight loss efforts (see below). Resume Ozempic. Once BMI closer to 35 (and DL infection cleared) will refer to [facility] for begin transplant evaluation (Body mass index is 45.8 kg/m.)   2. VAD  - s/p HM-3 placement 07/21/21  - VAD interrogated personally. Parameters stable. - INR 1.8 Goal INR 2.0-3.0 Discussed dosing with PharmD personally. - LDH 139 - DL site much improved - Hgb 13.9  3. DL site infection - Much improved. - Dressing changed today - Continue cefadroxil for long-term suppression  4 Atrial fibrillation: Paroxysmal - Had severe AF with RVR 7/22 s/p DC-CV x 2 - Seen by EP. Not a candidate for ablation due to body habitus  (could consider AVN ablation and CRT, if needed)  - Had post-op AF with RVR post VAD - 10/23 S/P DC-CV with restoration of NSR. - Remains in NSR Continue amio 100 daily. No change - On warfarin. INR as above  5. HTN - MAPs look good.   6. CKD stage 3B  - Has solitary kidney.   - Baseline creatinine is 1.5-1.8.  - Creatinine 0.9today   7. OSA - Continue CPAP   8. DM2  - On Jardiance. - Hgb A1c 9.4. -> 5.9 - PCP following  9. Polyarticular gout. - quiescent  10. Depression/anxiety - Continue Zoloft '150mg'$  daily - Much improved  11. Morbid obesity - Body mass index is 45.8 kg/m. - Resume GLP1RA  12.  Palpitations/dizziness - Zio 12/23 - 77 runs SVT. Continue amio. PVCs 1.9% PACs 4.7%  Total time spent 35 minutes. Over half that time spent discussing  above.    Glori Bickers, MD  9:22 PM

## 2022-10-26 ENCOUNTER — Ambulatory Visit (HOSPITAL_COMMUNITY)
Admission: RE | Admit: 2022-10-26 | Discharge: 2022-10-26 | Disposition: A | Discharge status: Home / Self Care | Payer: Medicaid Other | Source: Ambulatory Visit | Attending: Internal Medicine | Admitting: Internal Medicine

## 2022-10-26 ENCOUNTER — Other Ambulatory Visit: Payer: Self-pay

## 2022-10-26 DIAGNOSIS — Z452 Encounter for adjustment and management of vascular access device: Secondary | ICD-10-CM | POA: Diagnosis not present

## 2022-10-26 DIAGNOSIS — Z95811 Presence of heart assist device: Secondary | ICD-10-CM | POA: Diagnosis not present

## 2022-10-26 NOTE — Progress Notes (Signed)
Pt presents to VAD clinic for wound care by himself.   Pt confirms he is taking Cefadroxil 1000 mg bid today.   Exit site care:  Drive line being maintained by VAD Coordinators and Dr. Darcey Nora. Existing dressing removed using sterile technique. Wound bed cleansed with hypochlorous spray and allowed to dry. Skin surrounding site cleansed with CHG swab x 2 and allowed to dry.  Exit site healing and incorporated, the velour is exposed 5 cm at exit site. Wound bed beefy red. Wound does not tunnel. No tenderness or rash. Scant amount of serous drainage. Hypochlorous soaked 2 x 2 wrapped around drive line at exit site and covered with 4 x 4 gauze. Anchor applied x 2. Pt denies fever or chills. Plan for dressing change on Monday in [facility] clinic per Dr Prescott Gum with daily dressing changes at home. Sterile hypochlorous soaked 2x2's given to pt for at home use along with 8 anchors.      Plan:  Return to VAD clinic on Monday for dressing change with Dr Prescott Gum  Daily dressing changes at home with hypochlorous soaked 2x2 placed in wound bed.  [name] RN [place] Coordinator  Office: [phone]  24/7 Pager: [phone]

## 2022-10-27 ENCOUNTER — Other Ambulatory Visit: Payer: Self-pay

## 2022-10-29 ENCOUNTER — Other Ambulatory Visit (HOSPITAL_COMMUNITY): Payer: Self-pay

## 2022-10-29 ENCOUNTER — Other Ambulatory Visit: Payer: Self-pay

## 2022-10-29 DIAGNOSIS — Z7901 Long term (current) use of anticoagulants: Secondary | ICD-10-CM

## 2022-10-29 DIAGNOSIS — Z95811 Presence of heart assist device: Secondary | ICD-10-CM

## 2022-10-29 DIAGNOSIS — E611 Iron deficiency: Secondary | ICD-10-CM

## 2022-10-30 ENCOUNTER — Other Ambulatory Visit: Payer: Medicaid Other | Admitting: Obstetrics and Gynecology

## 2022-10-30 ENCOUNTER — Encounter: Payer: Self-pay | Admitting: Obstetrics and Gynecology

## 2022-10-30 NOTE — Patient Outreach (Signed)
Medicaid Managed Care   Nurse Care Manager Note  10/30/2022 Name:  Jeffrey [name] MRN:  [id number] DOB:  [birth year]  Jeffrey [name] is an 47 y.o. year old male who is a primary patient of Fenton Foy, NP.  The [facility] Managed Care Coordination team was consulted for assistance with:    Chronic healthcare management needs, DM, HTN, OSA, CHF, DLD, CKD, gout, h/o renal cancer, HLD, depression  Jeffrey [name] was given information about Medicaid Managed Care Coordination team services today. Jeffrey [name] Patient agreed to services and verbal consent obtained.  Engaged with patient by telephone for follow up visit in response to provider referral for case management and/or care coordination services.   Assessments/Interventions:  Review of past medical history, allergies, medications, health status, including review of consultants reports, laboratory and other test data, was performed as part of comprehensive evaluation and provision of chronic care management services.  SDOH (Social Determinants of Health) assessments and interventions performed: SDOH Interventions    Flowsheet Row Patient Outreach Telephone from 10/30/2022 in [place] Patient Outreach Telephone from 10/12/2022 in [place] Patient Outreach Telephone from 10/02/2022 in [place] Patient Outreach Telephone from 07/30/2022 in [place] Patient Outreach Telephone from 05/18/2022 in [place] Patient Outreach Telephone from 02/13/2022 in [place]  SDOH Interventions        Transportation Interventions -- -- -- -- -- Other (Comment)  [Insurance Plan transportation available to patient]  Utilities Interventions -- -- -- Intervention Not Indicated -- --  Alcohol Usage Interventions -- Intervention Not Indicated (Score <7) --  Intervention Not Indicated (Score <7) -- --  Financial Strain Interventions Other (Comment)  [patient working with BSW] -- Other (Comment)  [referral placed] -- -- --  Physical Activity Interventions -- -- -- Intervention Not Indicated -- --  Stress Interventions Other (Comment)  [SW following-concerns over wait for disability and bills due.] -- Other (Comment)  [referral placed for BSW-patient [facility]] -- Intervention Not Indicated --     Care Plan  Allergies  Allergen Reactions   Aspirin Shortness Of Breath, Itching and Rash     Burning sensation (Patient reports he tolerates other NSAIDS)    Bee Venom Hives and Swelling   Lisinopril Cough   Shrimp (Diagnostic) Rash    Medications Reviewed Today     Reviewed by Gayla Medicus, RN (Registered Nurse) on 10/30/22 at 1251  Med List Status: <None>   Medication Order Taking? Sig Documenting Provider Last Dose Status Informant  Accu-Chek FastClix Lancets MISC [id number] No USE AS DIRECTED FOUR TIMES DAILY  Patient not taking: Reported on 10/19/2022   Bensimhon, Shaune Pascal, MD Not Taking Active   acetaminophen (TYLENOL) 325 MG tablet [id number] No Take 2 tablets (650 mg total) by mouth every 6 (six) hours as needed for fever. Darrick Grinder D, NP Taking Active Self, Pharmacy Records  allopurinol (ZYLOPRIM) 100 MG tablet [id number] No Take 1 tablet (100 mg total) by mouth daily. Bensimhon, Shaune Pascal, MD Taking Active Self, Pharmacy Records  amiodarone (PACERONE) 200 MG tablet [id number] No Take 1 tablet (200 mg total) by mouth daily. Bensimhon, Shaune Pascal, MD Taking Active Self, Pharmacy Records  amLODipine (NORVASC) 10 MG tablet [id number] No Take 1 tablet (10 mg total) by mouth daily Bensimhon, Shaune Pascal, MD Taking Active Self, Pharmacy Records  cefadroxil (DURICEF)  500 MG capsule [id number] No Take 2 capsules (1,000 mg total) by mouth 2 (two) times daily. Dahlia Byes, MD Taking Active   colchicine 0.6 MG tablet [id number] No  Take 1 tablet (0.6 mg total) by mouth once daily. Bensimhon, Shaune Pascal, MD Taking Active Self, Pharmacy Records  diclofenac Sodium (VOLTAREN) 1 % GEL [id number] No Apply 2 g topically 4 (four) times daily.  Patient taking differently: Apply 2 g topically 4 (four) times daily as needed (pain).   Izora Ribas, MD Taking Active Self, Pharmacy Records  docusate sodium (COLACE) 100 MG capsule [id number] No Take 1 capsule (100 mg total) by mouth 2 (two) times daily.  Patient not taking: Reported on 10/19/2022   Conrad Hannah, NP Not Taking Active Self, Pharmacy Records  doxazosin (CARDURA) 1 MG tablet [id number] No Take 2 tablets (2 mg total) by mouth daily. Bensimhon, Shaune Pascal, MD Taking Active   gabapentin (NEURONTIN) 400 MG capsule [id number] No Take 1 capsule (400 mg total) by mouth 3 (three) times daily. Bensimhon, Shaune Pascal, MD Taking Active   glucose blood test strip [id number] No Use as directed 4 times daily. Bo Merino I, NP Taking Active Self, Pharmacy Records  HYDROcodone-acetaminophen [facility]) 10-325 MG tablet [id number] No Take 1 tablet by mouth every 6 (six) hours as needed for severe pain.  Patient not taking: Reported on 10/19/2022   Bensimhon, Shaune Pascal, MD Not Taking Active   Insulin Pen Needle (TRUEPLUS 5-BEVEL PEN NEEDLES) 29G X 12.7MM MISC [id number] No Use as directed in the morning, at noon, and at bedtime. Teena Dunk, NP Taking Active Self, Pharmacy Records  Insulin Syringe-Needle U-100 (ULTICARE INSULIN SYRINGE) 30G X 1/2" 1 ML MISC [id number] No USE TO INJECT INSULIN TWICE DAILY. Bensimhon, Shaune Pascal, MD Taking Active   isosorbide-hydrALAZINE (BIDIL) 20-37.5 MG tablet [id number] No Take 2 tablets by mouth 3 (three) times daily. Bensimhon, Shaune Pascal, MD Taking Active   meclizine (ANTIVERT) 25 MG tablet [id number] No Take 1 tablet (25 mg total) by mouth 3 (three) times daily as needed for dizziness.  Patient not taking: Reported on 10/19/2022   Bensimhon, Shaune Pascal, MD Not Taking  Active Self, Pharmacy Records  metFORMIN (GLUCOPHAGE) 1000 MG tablet [id number] No Take 1 tablet (1,000 mg total) by mouth 2 (two) times daily with a meal. Bensimhon, Shaune Pascal, MD Taking Active   pantoprazole (PROTONIX) 40 MG tablet [id number] No Take 1 tablet (40 mg total) by mouth once daily. Bensimhon, Shaune Pascal, MD Taking Active Self, Pharmacy Records  sacubitril-valsartan [facility]) 97-103 MG [id number] No Take 1 tablet by mouth 2 (two) times daily. Bensimhon, Shaune Pascal, MD Taking Active Self, Pharmacy Records  Semaglutide, 2 MG/DOSE, (OZEMPIC, 2 MG/DOSE,) 8 MG/3ML SOPN [id number] No Inject 2 mg into the skin once a week.  Patient not taking: Reported on 10/19/2022   Bensimhon, Shaune Pascal, MD Not Taking Active Self, Pharmacy Records  sertraline (ZOLOFT) 100 MG tablet [id number] No Take 1.5 tablets (150 mg total) by mouth daily. Bensimhon, Shaune Pascal, MD Taking Active Self, Pharmacy Records  spironolactone (ALDACTONE) 25 MG tablet [id number] No Take 1 tablet (25 mg total) by mouth daily. Bensimhon, Shaune Pascal, MD Taking Active   torsemide (DEMADEX) 20 MG tablet [id number] No Take 1 tablet (20 mg total) by mouth daily as needed (If wt gain > 3 lb in 24 hr or > 5 lb in 1 week).  Patient not taking: Reported on 10/19/2022   Fenton Foy, NP Not Taking Active Self, Pharmacy  Records  traMADol HCl 100 MG TABS [id number] No Take 1 tablet (100 mg) by mouth every 6 (six) hours as needed for pain Bensimhon, Shaune Pascal, MD Taking Active   traZODone (DESYREL) 100 MG tablet [id number] No Take 1 tablet (100 mg total) by mouth at bedtime. Bensimhon, Shaune Pascal, MD Taking Active Self, Pharmacy Records  warfarin (COUMADIN) 4 MG tablet [id number] No Take 2 tablet (8 mg) daily or as directed by HF clinic Bensimhon, Shaune Pascal, MD Taking Active            Med Note Orma Render   Mon Sep 21, 2022 11:44 AM) 4 mg every Monday and 8 mg all other days  Zinc Sulfate 220 (50 Zn) MG TABS [id number] No Take 1 tablet (220 mg total) by mouth  daily. Bensimhon, Shaune Pascal, MD Taking Active            Patient Active Problem List   Diagnosis Date Noted   Infection associated with driveline of left ventricular assist device (LVAD) ([place]) 08/10/2022   Presence of left ventricular assist device (LVAD) ([place]) 07/25/2021   Acute on chronic systolic CHF (congestive heart failure) ([place]) 07/18/2021   Cardiogenic shock (HCC)    Atrial fibrillation with RVR ([place]) 04/30/2021   Acute hypoxemic respiratory failure (HCC)    Right wrist pain 04/16/2021   Absolute anemia    Rectal bleeding    Iron deficiency 12/25/2020   Long term current use of anticoagulant - apixaban 12/25/2020   Paroxysmal atrial fibrillation (HCC)    Lactic acidosis 11/22/2020   Hypotension 11/22/2020   Aspiration into airway    ICD (implantable cardioverter-defibrillator) battery depletion 05/15/2020   Endotracheal tube present    Respiratory failure ([place])    Acute encephalopathy    Acute pulmonary edema (HCC)    NICM (nonischemic cardiomyopathy) ([place]) 04/24/2020   S/P vasectomy 12/20/2019   Anxiety 12/20/2019   Hemoglobin A1C between 7% and 9% indicating borderline diabetic control ([place]) 12/20/2019   Seasonal allergies 12/20/2019   Insomnia 12/20/2019   Vasectomy evaluation 06/20/2019   Visual problems 06/20/2019   Blurry vision, bilateral 06/20/2019   Hyperglycemia 02/13/2019   Hyperosmolar (nonketotic) coma ([place]) 01/19/2019   AICD (automatic cardioverter/defibrillator) present 01/19/2019   Hyperlipidemia 12/07/2018   Follow-up exam 11/22/2018   Class 3 severe obesity due to excess calories with serious comorbidity and body mass index (BMI) of 45.0 to 49.9 in adult ([place]) 11/22/2018   Dizziness 11/22/2018   Lingular pneumonia 08/04/2018   Ventricular tachycardia ([place]) 12/30/2017   PAF (paroxysmal atrial fibrillation) ([place])    Dilated cardiomyopathy ([place])    Pollen allergy 02/17/2017   Dyslipidemia 02/17/2017   Hematochezia 11/08/2015   CKD (chronic  kidney disease), stage III ([place]) 06/20/2015   Cardiac defibrillator in situ    Chronic systolic CHF (congestive heart failure) ([place]) 03/11/2015   Gouty arthritis 03/11/2015   Chest pain 12/28/2014   Acute renal failure superimposed on stage 3 chronic kidney disease ([place]) 04/08/2014   Aspirin allergy 04/08/2014   Renal cell carcinoma ([place])    Gout attack 11/10/2011   Morbid obesity ([place]) 10/23/2011   OSA (obstructive sleep apnea) 10/23/2011   Type 2 diabetes mellitus with stage 3 chronic kidney disease, with long-term current use of insulin ([place]) 10/22/2011   Hyperlipidemia associated with type 2 diabetes mellitus ([place]) 12/25/2010   Hypertension associated with diabetes ([place]) 12/25/2010   Conditions to be addressed/monitored per PCP order:  Chronic healthcare management needs, DM, HTN, OSA, CHF, DLD, CKD,  gout, h/o renal cancer, HLD, depression  Care Plan : General Plan of Care (Adult)  Updates made by Gayla Medicus, RN since 10/30/2022 12:00 AM     Problem: Health Promotion or Disease Self-Management (General Plan of Care)   Priority: High  Onset Date: 01/29/2023  Note:   Current Barriers:  Chronic Disease Management support and education needs, S/P LVAD 07/21/21, DM, HTN, OSA, CHF, Afib, CKD, gout, DDD, HLD, depression,  h/o renal Ca 10/30/22: Patient's infection improving-is seen at [place] [facility] weekly.  Wound vac removed.  Remains on Zoloft for depression-feels like it is helping but does experience depression several nights a week-feels like it is manageable at this time and declines LCSW.   Nurse Case Manager Clinical Goal(s):  Over the next 90 days, patient will attend all scheduled medical appointments: Over the next 30 days, patient will have dental appt scheduled  Interventions:  Inter-disciplinary care team collaboration (see longitudinal plan of care) Collaborated with BSW  BSW referral for disability information-completed Evaluation of current treatment plan and  patient's adherence to plan as established by provider. .  BSW completed a telephone outreach with patient, he stated he applied for disability over a year ago and has not heard anything back yet. Every time he calls he is on hold for over 2 hours and the phone hangs up. BSW informed patient it can take a long time to get a decision from disability. BSW provided patient with the telephone number for legal aid to assist with the process. Patient states he was able to be added to his wife's foodstamp case, no other resources are needed at this time. Reviewed medications with patient. Discussed plans with patient for ongoing care management follow up and provided patient with direct contact information for care management team Provided patient with educational materials related to exercise. Reviewed scheduled/upcoming provider appointments. Collaborated with Pharmacy Pharmacy referral for medication review  Patient Goals/Self-Care Activities Over the next 90 days, patient will:  -Attends all scheduled provider appointments Calls provider office for new concerns or questionss  Follow Up Plan: The Managed Medicaid care management team will reach out to the patient again over the next 30 business  days.  The patient has been provided with contact information for the Managed Medicaid care management team and has been advised to call with any health related questions or concerns.    Follow Up:  Patient agrees to Care Plan and Follow-up.  Plan: The Managed Medicaid care management team will reach out to the patient again over the next 30 business  days. and The  Patient has been provided with contact information for the Managed Medicaid care management team and has been advised to call with any health related questions or concerns.  Date/time of next scheduled RN care management/care coordination outreach:  11/27/22 at 230.

## 2022-10-30 NOTE — Patient Instructions (Signed)
Hi Jeffrey [name] for speaking with me-I hope your children get to feeling better soon!!  Jeffrey [name] was given information about Medicaid Managed Care team care coordination services as a part of their Healthy [facility] Medicaid benefit. Jeffrey [name] verbally consented to engagement with the [facility] Managed Care team.   If you are experiencing a medical emergency, please call 911 or report to your local emergency department or urgent care.   If you have a non-emergency medical problem during routine business hours, please contact your provider's office and ask to speak with a nurse.   For questions related to your Healthy [facility] health plan, please call: [phone] or visit the homepage here: [email or web address]  If you would like to schedule transportation through your Healthy [facility] plan, please call the following number at least 2 days in advance of your appointment: [phone]  For information about your ride after you set it up, call Ride Assist at [phone]. Use this number to activate a Will Call pickup, or if your transportation is late for a scheduled pickup. Use this number, too, if you need to make a change or cancel a previously scheduled reservation.  If you need transportation services right away, call [phone]. The after-hours call center is staffed 24 hours to handle ride assistance and urgent reservation requests (including discharges) 365 days a year. Urgent trips include sick visits, hospital discharge requests and life-sustaining treatment.  Call the [place] at [phone], at any time, 24 hours a day, 7 days a week. If you are in danger or need immediate medical attention call 911.  If you would like help to quit smoking, call 1-800-QUIT-NOW 812-526-5042) OR Espaol: 1-855-Djelo-Ya (6-063-016-0109) o para ms informacin haga clic aqu or Text READY to 200-400 to register via text  Jeffrey [name] -  following are the goals we discussed in your visit today:   Goals Addressed             This Visit's Progress    Protect My Health       Timeframe:  Long-Range Goal Priority:  High Start Date:    09/24/20                         Expected End Date:  ongoing           - schedule recommended health tests  - schedule and keep appointment for annual check-up  10/30/22:  followed weekly at [place] clinic right now, seen and evaluated by ID and CARDS   Patient verbalizes understanding of instructions and care plan provided today and agrees to view in [place]. Active MyChart status and patient understanding of how to access instructions and care plan via MyChart confirmed with patient.     The Managed Medicaid care management team will reach out to the patient again over the next 30 business  days.  The  Patient  has been provided with contact information for the Managed Medicaid care management team and has been advised to call with any health related questions or concerns.   [name] RN, BSN [place] Management Coordinator - Managed Medicaid High Risk [phone]   Following is a copy of your plan of care:  Care Plan : General Plan of Care (Adult)  Updates made by Gayla Medicus, RN since 10/30/2022 12:00 AM     Problem: Health Promotion or Disease Self-Management (General Plan of Care)   Priority: High  Onset Date: 01/29/2023  Note:   Current Barriers:  Chronic Disease Management support and education needs, S/P LVAD 07/21/21, DM, HTN, OSA, CHF, Afib, CKD, gout, DDD, HLD, depression,  h/o renal Ca 10/30/22: Patient's infection improving-is seen at [place] [facility] weekly.  Wound vac removed.  Remains on Zoloft for depression-feels like it is helping but does experience depression several nights a week-feels like it is manageable at this time and declines LCSW.   Nurse Case Manager Clinical Goal(s):  Over the next 90 days, patient will attend all  scheduled medical appointments: Over the next 30 days, patient will have dental appt scheduled  Interventions:  Inter-disciplinary care team collaboration (see longitudinal plan of care) Collaborated with BSW  BSW referral for disability information-completed Evaluation of current treatment plan and patient's adherence to plan as established by provider. .  BSW completed a telephone outreach with patient, he stated he applied for disability over a year ago and has not heard anything back yet. Every time he calls he is on hold for over 2 hours and the phone hangs up. BSW informed patient it can take a long time to get a decision from disability. BSW provided patient with the telephone number for legal aid to assist with the process. Patient states he was able to be added to his wife's foodstamp case, no other resources are needed at this time. Reviewed medications with patient. Discussed plans with patient for ongoing care management follow up and provided patient with direct contact information for care management team Provided patient with educational materials related to exercise. Reviewed scheduled/upcoming provider appointments. Collaborated with Pharmacy Pharmacy referral for medication review  Patient Goals/Self-Care Activities Over the next 90 days, patient will:  -Attends all scheduled provider appointments Calls provider office for new concerns or questionss  Follow Up Plan: The Managed Medicaid care management team will reach out to the patient again over the next 30 business  days.  The patient has been provided with contact information for the Managed Medicaid care management team and has been advised to call with any health related questions or concerns.

## 2022-11-02 ENCOUNTER — Other Ambulatory Visit: Payer: Self-pay

## 2022-11-02 ENCOUNTER — Ambulatory Visit (HOSPITAL_COMMUNITY): Payer: Self-pay | Admitting: Pharmacist

## 2022-11-02 ENCOUNTER — Ambulatory Visit (HOSPITAL_COMMUNITY)
Admission: RE | Admit: 2022-11-02 | Discharge: 2022-11-02 | Disposition: A | Discharge status: Home / Self Care | Payer: Medicaid Other | Source: Ambulatory Visit | Attending: Cardiology | Admitting: Cardiology

## 2022-11-02 DIAGNOSIS — T827XXA Infection and inflammatory reaction due to other cardiac and vascular devices, implants and grafts, initial encounter: Secondary | ICD-10-CM

## 2022-11-02 DIAGNOSIS — Z95811 Presence of heart assist device: Secondary | ICD-10-CM

## 2022-11-02 DIAGNOSIS — Z7901 Long term (current) use of anticoagulants: Secondary | ICD-10-CM

## 2022-11-02 DIAGNOSIS — Z4801 Encounter for change or removal of surgical wound dressing: Secondary | ICD-10-CM | POA: Diagnosis not present

## 2022-11-02 LAB — PROTIME-INR
INR: 1.6 — ABNORMAL HIGH (ref 0.8–1.2)
Prothrombin Time: 18.6 seconds — ABNORMAL HIGH (ref 11.4–15.2)

## 2022-11-02 MED ORDER — CEFADROXIL 500 MG PO CAPS
500.0000 mg | ORAL_CAPSULE | Freq: Two times a day (BID) | ORAL | 3 refills | Status: DC
  Filled 2022-11-02 – 2022-11-09 (×2): qty 60, 30d supply, fill #0
  Filled 2022-12-11: qty 60, 30d supply, fill #1

## 2022-11-02 NOTE — Progress Notes (Signed)
Pt [facility] for [facility] care by himself.   Pt confirms he is taking Cefadroxil 1000 mg bid today. Reports he filled his pillbox with remaining 1 week worth of antibiotic he has at home. Discussed with Dr Prescott Gum. Will continue Cefadroxil 500 mg BID for suppressive coverage. Prescription sent to pt's pharmacy.    Exit site care:  Drive line being maintained by [facility] Coordinators and Dr. Darcey Nora. Existing dressing removed using sterile technique. Wound bed cleansed with hypochlorous spray and allowed to dry. Skin surrounding site cleansed with CHG swab x 2 and allowed to dry.  Exit site healing and incorporated, the velour is exposed 5 cm at exit site. Wound bed beefy red. Wound does not tunnel. No tenderness or rash. Scant amount of serous drainage noted on gauze. Hypochlorous soaked 2 x 2 wrapped around drive line at exit site and covered with 4 x 4 gauze. Anchor reapplied x 2. Pt denies fever or chills.   Per Dr Prescott Gum may advance to every other day dressing changes at home. Plan for dressing change on Monday in [place] clinic per Dr Prescott Gum. Pt has adequate dressing supplies at home.      Plan:  Return to [facility] clinic on Monday for dressing change with Dr Prescott Gum  Every other day dressing changes at home with hypochlorous soaked 2x2 placed in wound bed. Coumadin dosing per Lauren PharmD  [name] RN [place] Coordinator  Office: [phone]  24/7 Pager: [phone]

## 2022-11-06 ENCOUNTER — Other Ambulatory Visit: Payer: Medicaid Other

## 2022-11-06 NOTE — Patient Outreach (Signed)
Medicaid Managed Care Social Work Note  11/06/2022 Name:  Jeffrey [name] MRN:  [id number] DOB:  [birth year]  Jeffrey [name] is an 47 y.o. year old male who is a primary patient of Fenton Foy, NP.  The Medicaid Managed Care Coordination team was consulted for assistance with:  Community Resources   Jeffrey [name] was given information about Medicaid Managed Care Coordination team services today. Jeffrey [name] Patient agreed to services and verbal consent obtained.  Engaged with patient  for by telephone forfollow up visit in response to referral for case management and/or care coordination services.   Assessments/Interventions:  Review of past medical history, allergies, medications, health status, including review of consultants reports, laboratory and other test data, was performed as part of comprehensive evaluation and provision of chronic care management services.  SDOH: (Social Determinant of Health) assessments and interventions performed: SDOH Interventions    Flowsheet Row Patient Outreach Telephone from 10/30/2022 in [place] Patient Outreach Telephone from 10/12/2022 in [place] Patient Outreach Telephone from 10/02/2022 in [place] Patient Outreach Telephone from 07/30/2022 in [place] Patient Outreach Telephone from 05/18/2022 in [place] Patient Outreach Telephone from 02/13/2022 in [place]  SDOH Interventions        Transportation Interventions -- -- -- -- -- Other (Comment)  [Insurance Plan transportation available to patient]  Utilities Interventions -- -- -- Intervention Not Indicated -- --  Alcohol Usage Interventions -- Intervention Not Indicated (Score <7) -- Intervention Not Indicated (Score <7) -- --  Financial Strain Interventions Other (Comment)  [patient  working with BSW] -- Other (Comment)  [referral placed] -- -- --  Physical Activity Interventions -- -- -- Intervention Not Indicated -- --  Stress Interventions Other (Comment)  [SW following-concerns over wait for disability and bills due.] -- Other (Comment)  [referral placed for BSW-patient will also f[facility] with [facility]] -- Intervention Not Indicated --     BSW completed a telephone outreach patient, he stated he did receieve his letter from his PCP. No resources are needed right now, patient states he is doing good.  Advanced Directives Status:  Not addressed in this encounter.  Care Plan                 Allergies  Allergen Reactions   Aspirin Shortness Of Breath, Itching and Rash     Burning sensation (Patient reports he tolerates other NSAIDS)    Bee Venom Hives and Swelling   Lisinopril Cough   Shrimp (Diagnostic) Rash    Medications Reviewed Today     Reviewed by Gayla Medicus, RN (Registered Nurse) on 10/30/22 at 1251  Med List Status: <None>   Medication Order Taking? Sig Documenting Provider Last Dose Status Informant  Accu-Chek FastClix Lancets MISC [id number] No USE AS DIRECTED FOUR TIMES DAILY  Patient not taking: Reported on 10/19/2022   Bensimhon, Shaune Pascal, MD Not Taking Active   acetaminophen (TYLENOL) 325 MG tablet [id number] No Take 2 tablets (650 mg total) by mouth every 6 (six) hours as needed for fever. Darrick Grinder D, NP Taking Active Self, Pharmacy Records  allopurinol (ZYLOPRIM) 100 MG tablet [id number] No Take 1 tablet (100 mg total) by mouth daily. Bensimhon, Shaune Pascal, MD Taking Active Self, Pharmacy Records  amiodarone (PACERONE) 200 MG tablet [id number] No Take 1 tablet (200 mg total) by mouth daily. Bensimhon, Quillian Quince  R, MD Taking Active Self, Pharmacy Records  amLODipine (NORVASC) 10 MG tablet [id number] No Take 1 tablet (10 mg total) by mouth daily Bensimhon, Shaune Pascal, MD Taking Active Self, Pharmacy Records  cefadroxil (DURICEF) 500 MG capsule [id number]  No Take 2 capsules (1,000 mg total) by mouth 2 (two) times daily. Dahlia Byes, MD Taking Active   colchicine 0.6 MG tablet [id number] No Take 1 tablet (0.6 mg total) by mouth once daily. Bensimhon, Shaune Pascal, MD Taking Active Self, Pharmacy Records  diclofenac Sodium (VOLTAREN) 1 % GEL [id number] No Apply 2 g topically 4 (four) times daily.  Patient taking differently: Apply 2 g topically 4 (four) times daily as needed (pain).   Izora Ribas, MD Taking Active Self, Pharmacy Records  docusate sodium (COLACE) 100 MG capsule [id number] No Take 1 capsule (100 mg total) by mouth 2 (two) times daily.  Patient not taking: Reported on 10/19/2022   Conrad Selfridge, NP Not Taking Active Self, Pharmacy Records  doxazosin (CARDURA) 1 MG tablet [id number] No Take 2 tablets (2 mg total) by mouth daily. Bensimhon, Shaune Pascal, MD Taking Active   gabapentin (NEURONTIN) 400 MG capsule [id number] No Take 1 capsule (400 mg total) by mouth 3 (three) times daily. Bensimhon, Shaune Pascal, MD Taking Active   glucose blood test strip [id number] No Use as directed 4 times daily. Bo Merino I, NP Taking Active Self, Pharmacy Records  HYDROcodone-acetaminophen [facility]) 10-325 MG tablet [id number] No Take 1 tablet by mouth every 6 (six) hours as needed for severe pain.  Patient not taking: Reported on 10/19/2022   Bensimhon, Shaune Pascal, MD Not Taking Active   Insulin Pen Needle (TRUEPLUS 5-BEVEL PEN NEEDLES) 29G X 12.7MM MISC [id number] No Use as directed in the morning, at noon, and at bedtime. Teena Dunk, NP Taking Active Self, Pharmacy Records  Insulin Syringe-Needle U-100 (ULTICARE INSULIN SYRINGE) 30G X 1/2" 1 ML MISC [id number] No USE TO INJECT INSULIN TWICE DAILY. Bensimhon, Shaune Pascal, MD Taking Active   isosorbide-hydrALAZINE (BIDIL) 20-37.5 MG tablet [id number] No Take 2 tablets by mouth 3 (three) times daily. Bensimhon, Shaune Pascal, MD Taking Active   meclizine (ANTIVERT) 25 MG tablet [id number] No Take 1 tablet (25 mg  total) by mouth 3 (three) times daily as needed for dizziness.  Patient not taking: Reported on 10/19/2022   Bensimhon, Shaune Pascal, MD Not Taking Active Self, Pharmacy Records  metFORMIN (GLUCOPHAGE) 1000 MG tablet [id number] No Take 1 tablet (1,000 mg total) by mouth 2 (two) times daily with a meal. Bensimhon, Shaune Pascal, MD Taking Active   pantoprazole (PROTONIX) 40 MG tablet [id number] No Take 1 tablet (40 mg total) by mouth once daily. Bensimhon, Shaune Pascal, MD Taking Active Self, Pharmacy Records  sacubitril-valsartan [facility]) 97-103 MG [id number] No Take 1 tablet by mouth 2 (two) times daily. Bensimhon, Shaune Pascal, MD Taking Active Self, Pharmacy Records  Semaglutide, 2 MG/DOSE, (OZEMPIC, 2 MG/DOSE,) 8 MG/3ML SOPN [id number] No Inject 2 mg into the skin once a week.  Patient not taking: Reported on 10/19/2022   Bensimhon, Shaune Pascal, MD Not Taking Active Self, Pharmacy Records  sertraline (ZOLOFT) 100 MG tablet [id number] No Take 1.5 tablets (150 mg total) by mouth daily. Bensimhon, Shaune Pascal, MD Taking Active Self, Pharmacy Records  spironolactone (ALDACTONE) 25 MG tablet [id number] No Take 1 tablet (25 mg total) by mouth daily. Bensimhon, Shaune Pascal, MD Taking Active   torsemide (DEMADEX) 20 MG tablet [id number] No Take 1 tablet (20 mg total) by mouth daily  as needed (If wt gain > 3 lb in 24 hr or > 5 lb in 1 week).  Patient not taking: Reported on 10/19/2022   Fenton Foy, NP Not Taking Active Self, Pharmacy Records  traMADol HCl 100 MG TABS [id number] No Take 1 tablet (100 mg) by mouth every 6 (six) hours as needed for pain Bensimhon, Shaune Pascal, MD Taking Active   traZODone (DESYREL) 100 MG tablet [id number] No Take 1 tablet (100 mg total) by mouth at bedtime. Bensimhon, Shaune Pascal, MD Taking Active Self, Pharmacy Records  warfarin (COUMADIN) 4 MG tablet [id number] No Take 2 tablet (8 mg) daily or as directed by HF clinic Bensimhon, Shaune Pascal, MD Taking Active            Med Note Orma Render   Mon Sep 21, 2022 11:44 AM) 4 mg every Monday and 8 mg all other days  Zinc Sulfate 220 (50 Zn) MG TABS [id number] No Take 1 tablet (220 mg total) by mouth daily. 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[place]) 08/10/2022   Presence of left ventricular assist device (LVAD) ([place]) 07/25/2021   Acute on chronic systolic CHF (congestive heart failure) ([place]) 07/18/2021   Cardiogenic shock (HCC)    Atrial fibrillation with RVR ([place]) 04/30/2021   Acute hypoxemic respiratory failure (HCC)    Right wrist pain 04/16/2021   Absolute anemia    Rectal bleeding    Iron deficiency 12/25/2020   Long term current use of anticoagulant - apixaban 12/25/2020   Paroxysmal atrial fibrillation (HCC)    Lactic acidosis 11/22/2020   Hypotension 11/22/2020   Aspiration into airway    ICD (implantable cardioverter-defibrillator) battery depletion 05/15/2020   Endotracheal tube present    Respiratory failure ([place])    Acute encephalopathy    Acute pulmonary edema (HCC)    NICM (nonischemic cardiomyopathy) ([place]) 04/24/2020   S/P vasectomy 12/20/2019   Anxiety 12/20/2019   Hemoglobin A1C between 7% and 9% indicating borderline diabetic control ([place]) 12/20/2019   Seasonal allergies 12/20/2019   Insomnia 12/20/2019   Vasectomy evaluation 06/20/2019   Visual problems 06/20/2019   Blurry vision, bilateral 06/20/2019   Hyperglycemia 02/13/2019   Hyperosmolar (nonketotic) coma ([place]) 01/19/2019   AICD (automatic cardioverter/defibrillator) present 01/19/2019   Hyperlipidemia 12/07/2018   Follow-up exam 11/22/2018   Class 3 severe obesity due to excess calories with serious comorbidity and body mass index (BMI) of 45.0 to 49.9 in adult ([place]) 11/22/2018   Dizziness 11/22/2018   Lingular pneumonia 08/04/2018   Ventricular tachycardia ([place]) 12/30/2017   PAF (paroxysmal atrial  fibrillation) ([place])    Dilated cardiomyopathy ([place])    Pollen allergy 02/17/2017   Dyslipidemia 02/17/2017   Hematochezia 11/08/2015   CKD (chronic kidney disease), stage III ([place]) 06/20/2015   Cardiac defibrillator in situ    Chronic systolic CHF (congestive heart failure) ([place]) 03/11/2015   Gouty arthritis 03/11/2015   Chest pain 12/28/2014   Acute renal failure superimposed on stage 3 chronic kidney disease ([place]) 04/08/2014   Aspirin allergy 04/08/2014   Renal cell carcinoma ([place])    Gout attack 11/10/2011   Morbid obesity ([place]) 10/23/2011   OSA (obstructive sleep apnea) 10/23/2011   Type 2 diabetes mellitus with stage 3 chronic kidney disease, with long-term current use of insulin ([place]) 10/22/2011  Hyperlipidemia associated with type 2 diabetes mellitus ([place]) 12/25/2010   Hypertension associated with diabetes ([place]) 12/25/2010    Conditions to be addressed/monitored per PCP order:   community resources  There are no care plans that you recently modified to display for this patient.   Follow up:  Patient agrees to Care Plan and Follow-up.  Plan: The Managed Medicaid care management team will reach out to the patient again over the next 60 days.  Date/time of next scheduled Social Work care management/care coordination outreach:  01/05/23  [name], Arita Miss, [place] Medicaid Team  [phone]

## 2022-11-06 NOTE — Patient Instructions (Signed)
Visit Information  Jeffrey [name] was given information about Medicaid Managed Care team care coordination services as a part of their Healthy [facility] Medicaid benefit. [name] verbally consented to engagement with the [facility] Managed Care team.   If you are experiencing a medical emergency, please call 911 or report to your local emergency department or urgent care.   If you have a non-emergency medical problem during routine business hours, please contact your provider's office and ask to speak with a nurse.   For questions related to your Healthy [facility] health plan, please call: [phone] or visit the homepage here: [email or web address]  If you would like to schedule transportation through your Healthy [facility] plan, please call the following number at least 2 days in advance of your appointment: [phone]  For information about your ride after you set it up, call Ride Assist at [phone]. Use this number to activate a Will Call pickup, or if your transportation is late for a scheduled pickup. Use this number, too, if you need to make a change or cancel a previously scheduled reservation.  If you need transportation services right away, call [phone]. The after-hours call center is staffed 24 hours to handle ride assistance and urgent reservation requests (including discharges) 365 days a year. Urgent trips include sick visits, hospital discharge requests and life-sustaining treatment.  Call the [place] at [phone], at any time, 24 hours a day, 7 days a week. If you are in danger or need immediate medical attention call 911.  If you would like help to quit smoking, call 1-800-QUIT-NOW (860) 439-1750) OR Espaol: 1-855-Djelo-Ya (3-893-734-2876) o para ms informacin haga clic aqu or Text READY to 200-400 to register via text  [name] - following are the goals we discussed in your visit today:   Goals  Addressed   None      Social Worker will follow up on 01/05/23.   [name], BSW, [place] Managed Medicaid Team  [phone]   Following is a copy of your plan of care:  There are no care plans that you recently modified to display for this patient.

## 2022-11-09 ENCOUNTER — Other Ambulatory Visit: Payer: Self-pay

## 2022-11-09 ENCOUNTER — Ambulatory Visit (HOSPITAL_COMMUNITY)
Admission: RE | Admit: 2022-11-09 | Discharge: 2022-11-09 | Disposition: A | Discharge status: Home / Self Care | Payer: Medicaid Other | Source: Ambulatory Visit | Attending: Cardiology | Admitting: Cardiology

## 2022-11-09 ENCOUNTER — Telehealth: Payer: Self-pay | Admitting: Pharmacist

## 2022-11-09 DIAGNOSIS — E1159 Type 2 diabetes mellitus with other circulatory complications: Secondary | ICD-10-CM

## 2022-11-09 DIAGNOSIS — Z4801 Encounter for change or removal of surgical wound dressing: Secondary | ICD-10-CM | POA: Insufficient documentation

## 2022-11-09 DIAGNOSIS — Z95811 Presence of heart assist device: Secondary | ICD-10-CM

## 2022-11-09 DIAGNOSIS — I5022 Chronic systolic (congestive) heart failure: Secondary | ICD-10-CM

## 2022-11-09 MED ORDER — SEMAGLUTIDE(0.25 OR 0.5MG/DOS) 2 MG/3ML ~~LOC~~ SOPN
0.5000 mg | PEN_INJECTOR | SUBCUTANEOUS | 0 refills | Status: DC
  Filled 2022-11-09: qty 3, 28d supply, fill #0

## 2022-11-09 MED ORDER — OZEMPIC (2 MG/DOSE) 8 MG/3ML ~~LOC~~ SOPN
2.0000 mg | PEN_INJECTOR | SUBCUTANEOUS | 11 refills | Status: DC
  Filled 2022-11-09: qty 3, 28d supply, fill #0

## 2022-11-09 MED ORDER — GABAPENTIN 400 MG PO CAPS
400.0000 mg | ORAL_CAPSULE | Freq: Three times a day (TID) | ORAL | 6 refills | Status: DC
  Filled 2022-11-09: qty 30, 10d supply, fill #0
  Filled 2022-12-01: qty 30, 10d supply, fill #1
  Filled 2022-12-25: qty 30, 10d supply, fill #2
  Filled 2023-01-29: qty 30, 10d supply, fill #3
  Filled 2023-03-03 – 2023-03-10 (×2): qty 30, 10d supply, fill #4
  Filled 2023-04-12: qty 30, 10d supply, fill #5
  Filled 2023-04-23: qty 30, 10d supply, fill #6

## 2022-11-09 NOTE — Progress Notes (Signed)
Pt presents to VAD clinic for wound care by himself.   Pt confirms he is taking Cefadroxil 500 mg bid today.   Pt tells me that he is vomiting every morning when he wakes up and that he has diarrhea on and off all day. Pt recently started retaking his ozempic 2 mg. VAD coordinator spoke with Lauren pharm-D who states pt should have restarted his ozempic at a lower dose. Lauren will reach out to [employer] who has been titrating the pts ozempic prior to stopping it for dosing. Pt informed to stop further injections until he speaks with Jinny Blossom from pharmacy.   Exit site care:  Drive line being maintained by VAD Coordinators and Dr. Darcey Nora. Existing dressing removed using sterile technique. Wound bed cleansed with hypochlorous spray and allowed to dry. Skin surrounding site cleansed with CHG swab x 2 and allowed to dry.  Exit site healing and incorporated, the velour is exposed 5 cm at exit site. Wound does not tunnel. No drainage, tenderness or rash. Hypochlorous soaked 2 x 2 wrapped around drive line at exit site and covered with 4 x 4 gauze. Anchor reapplied x 2. Pt denies fever or chills.   Per Dr Prescott Gum may advance to every third day dressing changes at home. Plan for dressing change on Monday in [place] clinic per Dr Prescott Gum. Pt has adequate dressing supplies at home.      Plan:  Return to VAD clinic on Monday for dressing change with Dr Prescott Gum  Every third day dressing changes at home with hypochlorous soaked 2x2 placed in wound bed.   Tanda Rockers RN [place] Coordinator  Office: [phone]  24/7 Pager: [phone]   Patient and exit site wound inspected and dressing change personally performed. The exit site is now completely incorporated with healthy pink dermis.  There is no space around the power cord.  Patient will continue the suppressive Duricef 500 mg twice daily for MSSA.  Home dressing changes with VASHE hypochlorous wet-to-dry will be continued every third day.  Will be  followed in the VAD clinic to make sure the wound continues to progress.  Patient has some GI complaints as noted above. Normal VAD home, regular heart rhythm No abdominal tenderness or guarding.  patient examined and medical record reviewed,agree with above note. Jeffrey [name] 11/09/2022

## 2022-11-09 NOTE — Telephone Encounter (Signed)
Received message from Audry Riles, PharmD in CHF clinic. Pt previously on Ozempic titrated up to '2mg'$  weekly. Med held for surgery, then restarted in Jan at '2mg'$  weekly. Pt experiencing a lot of GI symptoms including N/V and diarrhea since then.  Called pt, he had been off of his Ozempic for a month. Gave 2nd injection of '2mg'$  on Saturday (he had 2 doses of this left). Will decrease dose to 0.'5mg'$  weekly and start titration period from this dose again. New rx sent in. Advised pt if he still has GI upset on this dose that he can inject lower 0.'25mg'$  weekly dose if needed which comes in the same pen. Otherwise, I'll give him a call in a month to titrate him back to '1mg'$  for a month, then back to maintenance '2mg'$  dose. Pt appreciative for the call.

## 2022-11-09 NOTE — Progress Notes (Signed)
Pics of Jeffrey [name]'s driveline:      Tanda Rockers RN, BSN [facility] Coordinator 24/7 Pager [phone]

## 2022-11-11 ENCOUNTER — Telehealth: Payer: Self-pay

## 2022-11-11 NOTE — Telephone Encounter (Signed)
Left patient a voice mail to call back to schedule follow up with Dr. Candiss Norse.

## 2022-11-11 NOTE — Telephone Encounter (Signed)
-----   Message from Beryle Flock, RN sent at 11/09/2022  4:26 PM EST ----- Regarding: FW: appt  ----- Message ----- From: Laurice Record, MD Sent: 11/09/2022   4:21 PM EST To: Carlyle Basques, MD; [facility] Triage Nurse Pool Subject: appt                                           Good afternoon,  Pt needs a follow-up appt with me. Thank you

## 2022-11-13 ENCOUNTER — Other Ambulatory Visit (HOSPITAL_COMMUNITY): Payer: Self-pay | Admitting: Unknown Physician Specialty

## 2022-11-13 DIAGNOSIS — E1159 Type 2 diabetes mellitus with other circulatory complications: Secondary | ICD-10-CM

## 2022-11-13 DIAGNOSIS — Z7901 Long term (current) use of anticoagulants: Secondary | ICD-10-CM

## 2022-11-13 DIAGNOSIS — Z95811 Presence of heart assist device: Secondary | ICD-10-CM

## 2022-11-16 ENCOUNTER — Ambulatory Visit (HOSPITAL_COMMUNITY): Payer: Self-pay | Admitting: Pharmacist

## 2022-11-16 ENCOUNTER — Other Ambulatory Visit (HOSPITAL_COMMUNITY): Payer: Self-pay | Admitting: *Deleted

## 2022-11-16 ENCOUNTER — Ambulatory Visit (HOSPITAL_COMMUNITY)
Admission: RE | Admit: 2022-11-16 | Discharge: 2022-11-16 | Disposition: A | Discharge status: Home / Self Care | Payer: Medicaid Other | Source: Ambulatory Visit | Attending: Internal Medicine | Admitting: Internal Medicine

## 2022-11-16 DIAGNOSIS — Z95811 Presence of heart assist device: Secondary | ICD-10-CM | POA: Diagnosis not present

## 2022-11-16 DIAGNOSIS — R112 Nausea with vomiting, unspecified: Secondary | ICD-10-CM | POA: Insufficient documentation

## 2022-11-16 DIAGNOSIS — Z4800 Encounter for change or removal of nonsurgical wound dressing: Secondary | ICD-10-CM | POA: Insufficient documentation

## 2022-11-16 DIAGNOSIS — Z7901 Long term (current) use of anticoagulants: Secondary | ICD-10-CM

## 2022-11-16 LAB — PROTIME-INR
INR: 2.6 — ABNORMAL HIGH (ref 0.8–1.2)
Prothrombin Time: 27.7 seconds — ABNORMAL HIGH (ref 11.4–15.2)

## 2022-11-16 NOTE — Progress Notes (Signed)
Pt presents to VAD clinic for wound care by himself.   Pt confirms he is taking Cefadroxil 500 mg bid today.   Pt tells me that spoke with [place] PharmD last week, and he started Ozempic 0.5 mg on Saturday. He is still experiencing nausea and vomiting in the mornings, and diarrhea during the day. He states it is "normal" for him to go to the bathroom multiple times per day. Advised he call [place] PharmD for further titration/dosing instructions as he is still symptomatic. He verbalized understanding.  Exit site care:  Drive line being maintained by VAD Coordinators and Dr. Darcey Nora. Existing dressing removed using sterile technique. Wound bed cleansed with hypochloric Vashe solution and allowed to dry. Skin surrounding site cleansed with CHG swab x 2 and allowed to dry. Exit site healing and incorporated, the velour is exposed 5 cm at exit site. Wound does not tunnel. No drainage, tenderness or rash. Vashe soaked 2 x 2 wrapped around drive line at exit site and covered with 4 x 4 gauze. Anchor reapplied x 2. Pt denies fever or chills. May continue every third day dressing changes at home. Plan for dressing change on Monday in [place] clinic per Dr Prescott Gum. Pt has adequate dressing supplies at home.   Plan:  Return to VAD clinic on Monday for dressing change with Dr Prescott Gum  Coumadin dosing per Ander Purpura PharmD Every third day dressing changes at home with hypochlorous soaked 2x2 placed in wound bed.  [name] RN [place] Coordinator  Office: [phone]  24/7 Pager: [phone]

## 2022-11-17 ENCOUNTER — Other Ambulatory Visit: Payer: Self-pay

## 2022-11-20 ENCOUNTER — Other Ambulatory Visit: Payer: Self-pay

## 2022-11-23 ENCOUNTER — Ambulatory Visit (HOSPITAL_COMMUNITY)
Admission: RE | Admit: 2022-11-23 | Discharge: 2022-11-23 | Disposition: A | Discharge status: Home / Self Care | Payer: Medicaid Other | Source: Ambulatory Visit | Attending: Internal Medicine | Admitting: Internal Medicine

## 2022-11-23 DIAGNOSIS — Z4801 Encounter for change or removal of surgical wound dressing: Secondary | ICD-10-CM | POA: Insufficient documentation

## 2022-11-23 DIAGNOSIS — T827XXA Infection and inflammatory reaction due to other cardiac and vascular devices, implants and grafts, initial encounter: Secondary | ICD-10-CM

## 2022-11-23 NOTE — Progress Notes (Signed)
Pt presents to VAD clinic for wound care by himself.   Pt confirms he is taking Cefadroxil 500 mg bid today.   Exit site care:  Drive line being maintained by VAD Coordinators and Dr. Prescott Gum. Existing dressing removed using sterile technique. Wound bed cleansed with hypochlorous spray solution and allowed to dry. Skin surrounding site cleansed with CHG swab x 2 and allowed to dry. Exit site healing and incorporated, the velour is exposed 5 cm at exit site. Wound does not tunnel. No drainage, tenderness or rash. Hypochlorous soaked 2 x 2 wrapped around drive line at exit site and covered with 4 x 4 gauze. Anchor reapplied x 2. Pt denies fever or chills. May continue every third day dressing changes at home. Plan for dressing change on Monday in [place] clinic. Pt provided with 7 daily kits, mepitel tape, and 5 anchors for home use.   Plan:  Return to VAD clinic on Monday for dressing change with Dr Prescott Gum  Coumadin dosing per Ander Purpura PharmD Every third day dressing changes at home with hypochlorous soaked 2x2 placed in wound bed.  [name] RN [place] Coordinator  Office: [phone]  24/7 Pager: [phone]

## 2022-11-24 ENCOUNTER — Telehealth: Payer: Self-pay

## 2022-11-24 NOTE — Telephone Encounter (Signed)
-----   Message from Beryle Flock, RN sent at 11/09/2022  4:26 PM EST ----- Regarding: FW: appt  ----- Message ----- From: Laurice Record, MD Sent: 11/09/2022   4:21 PM EST To: Carlyle Basques, MD; [facility] Triage Nurse Pool Subject: appt                                           Good afternoon,  Pt needs a follow-up appt with me. Thank you

## 2022-11-24 NOTE — Telephone Encounter (Signed)
Call could not be completed, patient needs to schedule a follow up with Dr. Candiss Norse. Will try again

## 2022-11-27 ENCOUNTER — Other Ambulatory Visit (HOSPITAL_COMMUNITY): Payer: Self-pay | Admitting: *Deleted

## 2022-11-27 ENCOUNTER — Other Ambulatory Visit: Payer: Medicaid Other | Admitting: Obstetrics and Gynecology

## 2022-11-27 DIAGNOSIS — Z7901 Long term (current) use of anticoagulants: Secondary | ICD-10-CM

## 2022-11-27 DIAGNOSIS — Z95811 Presence of heart assist device: Secondary | ICD-10-CM

## 2022-11-27 NOTE — Patient Instructions (Signed)
Hi Jeffrey [name], sorry to have missed you today- as a part of your Medicaid benefit, you are eligible for care management and care coordination services at no cost or copay. I was unable to reach you by phone today but would be happy to help you with your health related needs. Please feel free to call me at [phone]  A member of the Managed Medicaid care management team will reach out to you again over the next 30 business  days.   [name] RN, BSN [place]  Tri[facility] Curator - Managed Medicaid High Risk [phone]

## 2022-11-27 NOTE — Patient Outreach (Signed)
  Medicaid Managed Care   Unsuccessful Attempt Note   11/27/2022 Name: Jeffrey [name] MRN: [id number] DOB: [birth year]  Referred by: Fenton Foy, NP Reason for referral : High Risk Managed Medicaid (Unsuccessful telephone outreach)   An unsuccessful telephone outreach was attempted today. The patient was referred to the case management team for assistance with care management and care coordination.    Follow Up Plan: The Managed Medicaid care management team will reach out to the patient again over the next 30 business  days. and The  Patient has been provided with contact information for the Managed Medicaid care management team and has been advised to call with any health related questions or concerns.   [name] RN, BSN [place]  Tri[facility] Curator - Managed Medicaid High Risk [phone]

## 2022-11-30 ENCOUNTER — Ambulatory Visit (HOSPITAL_COMMUNITY): Payer: Self-pay | Admitting: Pharmacist

## 2022-11-30 ENCOUNTER — Ambulatory Visit (HOSPITAL_COMMUNITY)
Admission: RE | Admit: 2022-11-30 | Discharge: 2022-11-30 | Disposition: A | Discharge status: Home / Self Care | Payer: Medicaid Other | Source: Ambulatory Visit | Attending: Cardiology | Admitting: Cardiology

## 2022-11-30 ENCOUNTER — Telehealth: Payer: Self-pay | Admitting: Pharmacist

## 2022-11-30 ENCOUNTER — Other Ambulatory Visit: Payer: Self-pay

## 2022-11-30 DIAGNOSIS — Z4801 Encounter for change or removal of surgical wound dressing: Secondary | ICD-10-CM | POA: Insufficient documentation

## 2022-11-30 DIAGNOSIS — Z95811 Presence of heart assist device: Secondary | ICD-10-CM | POA: Insufficient documentation

## 2022-11-30 DIAGNOSIS — Z7901 Long term (current) use of anticoagulants: Secondary | ICD-10-CM | POA: Insufficient documentation

## 2022-11-30 LAB — PROTIME-INR
INR: 2 — ABNORMAL HIGH (ref 0.8–1.2)
Prothrombin Time: 22.1 seconds — ABNORMAL HIGH (ref 11.4–15.2)

## 2022-11-30 MED ORDER — SEMAGLUTIDE (1 MG/DOSE) 4 MG/3ML ~~LOC~~ SOPN
1.0000 mg | PEN_INJECTOR | SUBCUTANEOUS | 0 refills | Status: DC
  Filled 2022-11-30: qty 3, 28d supply, fill #0

## 2022-11-30 NOTE — Telephone Encounter (Addendum)
Called pt to follow up with Ozempic tolerability. Should have given 3rd dose of 0.'5mg'$  over the weekend. Will increase to '1mg'$  for the next month if tolerating well. Cell # stated call could not be completed. Called home # which is his wife's cell, she stated his phone was off and could try reaching him at [phone]. Called this # and left a message.

## 2022-11-30 NOTE — Addendum Note (Signed)
Encounter addended by: Doren Custard, RN on: 11/30/2022 11:55 AM  Actions taken: Clinical Note Signed

## 2022-11-30 NOTE — Progress Notes (Addendum)
Pt presents to VAD clinic for wound care by himself.   Pt confirms he is taking Cefadroxil 500 mg bid today.   Exit site care:  Drive line being maintained by VAD Coordinators and Dr. Prescott Gum. Existing dressing removed using sterile technique. Wound bed cleansed with hypochlorous spray solution and allowed to dry. Skin surrounding site cleansed with CHG swab x 2 and allowed to dry. Exit site healing and incorporated, the velour is exposed 5 cm at exit site. Wound does not tunnel. No drainage, tenderness or rash. Hypochlorous soaked 2 x 2 wrapped around drive line at exit site and covered with 4 x 4 gauze. Anchor reapplied x 2. Pt denies fever or chills. May continue every third day dressing changes at home. Plan for dressing change on Friday in [place] clinic at full visit.       Plan:  Return to VAD clinic on Friday for 2 mo f/u and dressing change Coumadin dosing per Ander Purpura PharmD Every third day dressing changes at home with hypochlorous soaked 2x2 placed in wound bed.  Bobbye Morton RN,BSN [place] Coordinator  Office: [phone]  24/7 Pager: [phone]

## 2022-11-30 NOTE — Telephone Encounter (Signed)
Pt returned call, tolerating Ozempic well. Will increase to '1mg'$  weekly for the next month, then back to '2mg'$  maintenance dose.

## 2022-12-01 ENCOUNTER — Other Ambulatory Visit: Payer: Self-pay

## 2022-12-02 ENCOUNTER — Other Ambulatory Visit: Payer: Self-pay

## 2022-12-02 ENCOUNTER — Ambulatory Visit: Payer: Medicaid Other

## 2022-12-02 DIAGNOSIS — I420 Dilated cardiomyopathy: Secondary | ICD-10-CM

## 2022-12-03 LAB — CUP PACEART REMOTE DEVICE CHECK
Battery Remaining Percentage: 83 %
Date Time Interrogation Session: 20240228130600
Implantable Lead Connection Status: 753985
Implantable Lead Implant Date: 20160812
Implantable Lead Location: 753862
Implantable Lead Model: 3401
Implantable Pulse Generator Implant Date: 20210812
Pulse Gen Serial Number: 139514

## 2022-12-04 ENCOUNTER — Other Ambulatory Visit (HOSPITAL_COMMUNITY): Payer: Self-pay

## 2022-12-07 ENCOUNTER — Other Ambulatory Visit (HOSPITAL_COMMUNITY): Payer: Self-pay

## 2022-12-08 ENCOUNTER — Other Ambulatory Visit: Payer: Medicaid Other | Admitting: Obstetrics and Gynecology

## 2022-12-08 ENCOUNTER — Encounter: Payer: Self-pay | Admitting: Obstetrics and Gynecology

## 2022-12-08 NOTE — Patient Outreach (Signed)
Care Coordination  12/08/2022  SETON ALDRIDGE 1976-03-22 [id number]  RNCM returned patient's phone call.  Patient states he received letter from DSS wanting to terminate his Medicaid coverage because wife makes too much money to qualify.  Patient states he needs proof of  hospital bills.  Patient given phone number for [employer] to assist with information needed.  [name] RN, BSN [place]  Triad Curator - Managed Medicaid High Risk [phone].

## 2022-12-10 ENCOUNTER — Other Ambulatory Visit (HOSPITAL_COMMUNITY): Payer: Self-pay | Admitting: *Deleted

## 2022-12-10 DIAGNOSIS — Z7901 Long term (current) use of anticoagulants: Secondary | ICD-10-CM

## 2022-12-10 DIAGNOSIS — Z95811 Presence of heart assist device: Secondary | ICD-10-CM

## 2022-12-10 NOTE — Addendum Note (Signed)
Addended by: [name] B on: 12/10/2022 03:48 PM   Modules accepted: Orders

## 2022-12-11 ENCOUNTER — Encounter (HOSPITAL_COMMUNITY): Payer: Self-pay | Admitting: Internal Medicine

## 2022-12-11 ENCOUNTER — Ambulatory Visit (HOSPITAL_COMMUNITY)
Admission: RE | Admit: 2022-12-11 | Discharge: 2022-12-11 | Disposition: A | Discharge status: Home / Self Care | Payer: Medicaid Other | Source: Ambulatory Visit | Attending: Internal Medicine | Admitting: Internal Medicine

## 2022-12-11 ENCOUNTER — Ambulatory Visit (HOSPITAL_COMMUNITY): Payer: Self-pay

## 2022-12-11 ENCOUNTER — Other Ambulatory Visit: Payer: Self-pay

## 2022-12-11 VITALS — BP 130/0 | HR 67

## 2022-12-11 DIAGNOSIS — E1122 Type 2 diabetes mellitus with diabetic chronic kidney disease: Secondary | ICD-10-CM | POA: Insufficient documentation

## 2022-12-11 DIAGNOSIS — Z95811 Presence of heart assist device: Secondary | ICD-10-CM | POA: Insufficient documentation

## 2022-12-11 DIAGNOSIS — N1831 Chronic kidney disease, stage 3a: Secondary | ICD-10-CM | POA: Diagnosis not present

## 2022-12-11 DIAGNOSIS — Z4801 Encounter for change or removal of surgical wound dressing: Secondary | ICD-10-CM | POA: Diagnosis not present

## 2022-12-11 DIAGNOSIS — Z7901 Long term (current) use of anticoagulants: Secondary | ICD-10-CM | POA: Insufficient documentation

## 2022-12-11 DIAGNOSIS — E119 Type 2 diabetes mellitus without complications: Secondary | ICD-10-CM

## 2022-12-11 DIAGNOSIS — Z794 Long term (current) use of insulin: Secondary | ICD-10-CM | POA: Insufficient documentation

## 2022-12-11 DIAGNOSIS — I5022 Chronic systolic (congestive) heart failure: Secondary | ICD-10-CM | POA: Diagnosis not present

## 2022-12-11 LAB — CBC
HCT: 40.5 % (ref 39.0–52.0)
Hemoglobin: 13.1 g/dL (ref 13.0–17.0)
MCH: 27.8 pg (ref 26.0–34.0)
MCHC: 32.3 g/dL (ref 30.0–36.0)
MCV: 85.8 fL (ref 80.0–100.0)
Platelets: 226 10*3/uL (ref 150–400)
RBC: 4.72 MIL/uL (ref 4.22–5.81)
RDW: 14.8 % (ref 11.5–15.5)
WBC: 5.8 10*3/uL (ref 4.0–10.5)
nRBC: 0 % (ref 0.0–0.2)

## 2022-12-11 LAB — LACTATE DEHYDROGENASE: LDH: 157 U/L (ref 98–192)

## 2022-12-11 LAB — BASIC METABOLIC PANEL
Anion gap: 7 (ref 5–15)
BUN: 20 mg/dL (ref 6–20)
CO2: 23 mmol/L (ref 22–32)
Calcium: 9.6 mg/dL (ref 8.9–10.3)
Chloride: 109 mmol/L (ref 98–111)
Creatinine, Ser: 1.34 mg/dL — ABNORMAL HIGH (ref 0.61–1.24)
GFR, Estimated: >60 mL/min (ref >60)
Glucose, Bld: 96 mg/dL (ref 70–99)
Potassium: 3.8 mmol/L (ref 3.5–5.1)
Sodium: 139 mmol/L (ref 135–145)

## 2022-12-11 LAB — LIPID PANEL
Cholesterol: 148 mg/dL (ref 0–200)
HDL: 44 mg/dL (ref >40)
LDL Cholesterol: 83 mg/dL (ref 0–99)
Total CHOL/HDL Ratio: 3.4 RATIO
Triglycerides: 103 mg/dL (ref <150)
VLDL: 21 mg/dL (ref 0–40)

## 2022-12-11 LAB — PROTIME-INR
INR: 2.5 — ABNORMAL HIGH (ref 0.8–1.2)
Prothrombin Time: 27.2 seconds — ABNORMAL HIGH (ref 11.4–15.2)

## 2022-12-11 MED ORDER — ROSUVASTATIN CALCIUM 10 MG PO TABS
10.0000 mg | ORAL_TABLET | Freq: Every day | ORAL | 3 refills | Status: DC
  Filled 2022-12-11: qty 90, 90d supply, fill #0
  Filled 2023-04-13: qty 90, 90d supply, fill #1
  Filled 2023-04-13: qty 30, 30d supply, fill #1
  Filled 2023-04-28 – 2023-05-10 (×2): qty 30, 30d supply, fill #2
  Filled 2023-06-02: qty 30, 30d supply, fill #3
  Filled 2023-07-02: qty 30, 30d supply, fill #4
  Filled 2023-08-03 – 2023-08-04 (×3): qty 30, 30d supply, fill #5

## 2022-12-11 NOTE — Patient Instructions (Addendum)
Start Crestor 10 mg daily  Change drive line dressing weekly using VASHE solution to cleanse wound Coumadin dosing per Jinny Blossom PharmD Return to clinic in 1 week for wound check Return to clinic in 2 months for follow up with Dr [name]

## 2022-12-11 NOTE — Progress Notes (Signed)
LVAD INR 

## 2022-12-11 NOTE — Progress Notes (Signed)
Patient presents for 2 mo f/u up in [facility] today alone. Reports no problem with VAD or equipment.  Pt states he is overall doing well. Denies lightheadedness, dizziness, falls, shortness of breath, heart failure symptoms and signs of bleeding. Reports intermittent mild nausea; taking Ozempic 1 mg weekly. He is exercising at the gym 3-4 days a week. He states he is motivated to get his health back on track.   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 Pt followed closely in [facility] for weekly dressing changes.   Pt insurance is requesting pt be started on a statin as he is a diabetic. Discussed with Dr [name]. Will start Crestor 10 mg daily. Lipid panel obtained today.   Dr Prescott Gum in to perform dressing change. See below for full documentation.  Vital Signs:  Doppler Pressure: 130 Automatc BP: 136/96 (113) HR: 67 SPO2: 95% on RA   Weight: 290.2 lb w/ eqt Last weight:  292.4 lbs w/eqt   VAD Indication: Destination Therapy due to smoking and BMI   VAD interrogation & Equipment Management: Speed: 6200 Flow: 4.4 Power: 5.1 w    PI: 5.7 Hct: 38   Alarms: none Events: 10 - 15 daily  Fixed speed 6200 Low speed limit: 5900   Primary Controller:  Replace back up battery in 14 months. Back up controller:   Replace back up battery in 21 months.   Annual Equipment Maintenance on UBC/PM was performed on 10/19/22.    I reviewed the LVAD parameters from today and compared the results to the patient's prior recorded data. LVAD interrogation was NEGATIVE for significant power changes, NEGATIVE for clinical alarms and POSITIVE for PI events/speed drops. No programming changes were made and pump is functioning within specified parameters. Pt is performing daily controller and system monitor self tests along with completing weekly and monthly maintenance for LVAD equipment.   LVAD equipment check completed and is in good working order.  Back-up equipment present.    Exit Site Care: Drive line being maintained by VAD Coordinators and Dr. Prescott Gum. Existing dressing removed using sterile technique. Wound bed cleansed with VASHE solution and allowed to dry. Skin surrounding site cleansed with CHG swab x 2 and allowed to dry. Exit site healing and incorporated, the velour is exposed 5 cm at exit site. Wound does not tunnel. No drainage, tenderness or rash. VASHE soaked 2 x 2 wrapped around drive line at exit site and covered with 4 x 4 gauze. Anchor reapplied x 2. Pt denies fever or chills. Advance to weekly dressing changes, cleansing with VASHE solution, with VASHE moistened 2 x 2 at exit site. Provided pt with 7 daily kits, 8 anchors, adhesive remover, and a large bottle of VASHE solution for home use. Will plan to change dressing in VAD clinic next Friday with Dr Prescott Gum.     Device:  Left subcutaneous dual ICD Therapies: on at 250 BPM Last check: 03/04/22  BP & Labs:  Doppler 130- Doppler is reflecting MAP   Hgb 13.1 - No S/S of bleeding. Specifically denies melena/BRBPR or nosebleeds.   LDH stable at 157 with established baseline of 160 - 300. Denies tea-colored urine. No power elevations noted on interrogation.    Plan:  Start Crestor 10 mg daily  Change drive line dressing weekly using VASHE solution to cleanse wound Coumadin dosing per Jinny Blossom PharmD Return to clinic in 1 week for wound check Return to clinic in 2 months  for follow up with Dr [name]  [name] RN [place] Coordinator  Office: [phone]  24/7 Pager: [phone]

## 2022-12-14 ENCOUNTER — Other Ambulatory Visit: Payer: Self-pay

## 2022-12-17 ENCOUNTER — Other Ambulatory Visit (HOSPITAL_COMMUNITY): Payer: Self-pay

## 2022-12-17 DIAGNOSIS — Z95811 Presence of heart assist device: Secondary | ICD-10-CM

## 2022-12-17 DIAGNOSIS — Z7901 Long term (current) use of anticoagulants: Secondary | ICD-10-CM

## 2022-12-18 ENCOUNTER — Ambulatory Visit (HOSPITAL_COMMUNITY)
Admission: RE | Admit: 2022-12-18 | Discharge: 2022-12-18 | Disposition: A | Discharge status: Home / Self Care | Payer: Medicaid Other | Source: Ambulatory Visit | Attending: Cardiology | Admitting: Cardiology

## 2022-12-18 ENCOUNTER — Other Ambulatory Visit: Payer: Self-pay

## 2022-12-18 DIAGNOSIS — Z95811 Presence of heart assist device: Secondary | ICD-10-CM | POA: Insufficient documentation

## 2022-12-18 DIAGNOSIS — E1159 Type 2 diabetes mellitus with other circulatory complications: Secondary | ICD-10-CM

## 2022-12-18 DIAGNOSIS — I5022 Chronic systolic (congestive) heart failure: Secondary | ICD-10-CM

## 2022-12-18 DIAGNOSIS — Z792 Long term (current) use of antibiotics: Secondary | ICD-10-CM | POA: Diagnosis not present

## 2022-12-18 MED ORDER — ISOSORB DINITRATE-HYDRALAZINE 20-37.5 MG PO TABS
2.0000 | ORAL_TABLET | Freq: Three times a day (TID) | ORAL | 6 refills | Status: DC
  Filled 2022-12-18: qty 180, 30d supply, fill #0
  Filled 2023-04-28 (×2): qty 180, 30d supply, fill #1
  Filled 2023-07-02 – 2023-08-06 (×4): qty 180, 30d supply, fill #2
  Filled 2023-10-12: qty 180, 30d supply, fill #3
  Filled 2023-12-03: qty 180, 30d supply, fill #4

## 2022-12-18 NOTE — Patient Instructions (Signed)
May change dressing weekly using weekly kit. Cleanse site with VASHE solution with each change If you notice any redness, tenderness, drainage, fever, chills notify VAD coordinator immediately May change dressing more frequently if you notice you are sweating more at the gym Return to [facility] in 1 week for INR check Return to [facility] in May for previously scheduled follow up appt with Dr [name]

## 2022-12-18 NOTE — Progress Notes (Addendum)
Patient presents for dressing change alone.   Pt states he is overall doing well. Continue Cefadroxil 500 mg PO bid for chronic suppression of drive line infection.    Refill for Bidil sent to pt's pharmacy per his request.   Pt aware he needs to reschedule ID appt.  Dr Prescott Gum in to perform dressing change. See below for full documentation. Per Dr Prescott Gum will advance to weekly dressing changes cleansing with VASHE solution using weekly kit. Advised if he notices any redness, tenderness, drainage, fever, or chills he must notify VAD coordinator immediately. He verbalized understanding.  Exit Site Care: Drive line being maintained by VAD Coordinators and Dr. Prescott Gum. Existing dressing removed using sterile technique. Wound bed cleansed with VASHE solution and allowed to dry. Skin surrounding site cleansed with CHG swab x 2 and allowed to dry. Site covered with Sorbaview dressing and silverlon patch. Exit site healing and incorporated, the velour is exposed 5 cm at exit site. Wound does not tunnel. No redness, drainage, tenderness, foul odor or rash noted. Anchor reapplied x 2. Pt denies fever or chills. Advance to weekly dressing changes, cleansing with VASHE solution using weekly kit. Provided pt with 8 weekly kit and 8 anchors for home use. Pt may graduate today to dressing changes at home vs coming to clinic weekly for wound care.     Plan:  May change dressing weekly using weekly kit. Cleanse site with VASHE solution with each change If you notice any redness, tenderness, drainage, fever, chills notify VAD coordinator immediately May change dressing more frequently if you notice you are sweating more at the gym Return to [facility] in 1 week for INR check Return to [facility] in May for previously scheduled follow up appt with Dr [name]  [name] RN [place] Coordinator  Office: [phone]  24/7 Pager: [phone]

## 2022-12-18 NOTE — Addendum Note (Signed)
Encounter addended by: Mertha Baars, RN on: 12/18/2022 12:07 PM  Actions taken: Clinical Note Signed

## 2022-12-21 ENCOUNTER — Ambulatory Visit: Payer: Medicaid Other | Admitting: Internal Medicine

## 2022-12-24 ENCOUNTER — Ambulatory Visit: Payer: Medicaid Other | Admitting: Internal Medicine

## 2022-12-24 ENCOUNTER — Other Ambulatory Visit (HOSPITAL_COMMUNITY): Payer: Medicaid Other

## 2022-12-24 NOTE — Progress Notes (Deleted)
Patient Active Problem List   Diagnosis Date Noted   Infection associated with driveline of left ventricular assist device (LVAD) ([place]) 08/10/2022   Presence of left ventricular assist device (LVAD) ([place]) 07/25/2021   Acute on chronic systolic CHF (congestive heart failure) ([place]) 07/18/2021   Cardiogenic shock ([place])    Atrial fibrillation with RVR ([place]) 04/30/2021   Acute hypoxemic respiratory failure (HCC)    Right wrist pain 04/16/2021   Absolute anemia    Rectal bleeding    Iron deficiency 12/25/2020   Long term current use of anticoagulant - apixaban 12/25/2020   Paroxysmal atrial fibrillation (HCC)    Lactic acidosis 11/22/2020   Hypotension 11/22/2020   Aspiration into airway    ICD (implantable cardioverter-defibrillator) battery depletion 05/15/2020   Endotracheal tube present    Respiratory failure ([place])    Acute encephalopathy    Acute pulmonary edema (HCC)    NICM (nonischemic cardiomyopathy) ([place]) 04/24/2020   S/P vasectomy 12/20/2019   Anxiety 12/20/2019   Hemoglobin A1C between 7% and 9% indicating borderline diabetic control ([place]) 12/20/2019   Seasonal allergies 12/20/2019   Insomnia 12/20/2019   Vasectomy evaluation 06/20/2019   Visual problems 06/20/2019   Blurry vision, bilateral 06/20/2019   Hyperglycemia 02/13/2019   Hyperosmolar (nonketotic) coma ([place]) 01/19/2019   AICD (automatic cardioverter/defibrillator) present 01/19/2019   Hyperlipidemia 12/07/2018   Follow-up exam 11/22/2018   Class 3 severe obesity due to excess calories with serious comorbidity and body mass index (BMI) of 45.0 to 49.9 in adult ([place]) 11/22/2018   Dizziness 11/22/2018   Lingular pneumonia 08/04/2018   Ventricular tachycardia ([place]) 12/30/2017   PAF (paroxysmal atrial fibrillation) ([place])    Dilated cardiomyopathy ([place])    Pollen allergy 02/17/2017   Dyslipidemia 02/17/2017   Hematochezia 11/08/2015   CKD (chronic kidney disease), stage III ([place]) 06/20/2015    Cardiac defibrillator in situ    Chronic systolic CHF (congestive heart failure) ([place]) 03/11/2015   Gouty arthritis 03/11/2015   Chest pain 12/28/2014   Acute renal failure superimposed on stage 3 chronic kidney disease ([place]) 04/08/2014   Aspirin allergy 04/08/2014   Renal cell carcinoma ([place])    Gout attack 11/10/2011   Morbid obesity ([place]) 10/23/2011   OSA (obstructive sleep apnea) 10/23/2011   Type 2 diabetes mellitus with stage 3 chronic kidney disease, with long-term current use of insulin ([place]) 10/22/2011   Hyperlipidemia associated with type 2 diabetes mellitus ([place]) 12/25/2010   Hypertension associated with diabetes ([place]) 12/25/2010    Patient's Medications  New Prescriptions   No medications on file  Previous Medications   ACCU-CHEK FASTCLIX LANCETS MISC    USE AS DIRECTED FOUR TIMES DAILY   ACETAMINOPHEN (TYLENOL) 325 MG TABLET    Take 2 tablets (650 mg total) by mouth every 6 (six) hours as needed for fever.   ALLOPURINOL (ZYLOPRIM) 100 MG TABLET    Take 1 tablet (100 mg total) by mouth daily.   AMIODARONE (PACERONE) 200 MG TABLET    Take 1 tablet (200 mg total) by mouth daily.   AMLODIPINE (NORVASC) 10 MG TABLET    Take 1 tablet (10 mg total) by mouth daily   CEFADROXIL (DURICEF) 500 MG CAPSULE    Take 1 capsule (500 mg total) by mouth 2 (two) times daily.   COLCHICINE 0.6 MG TABLET    Take 1 tablet (0.6 mg total) by mouth once daily.   DICLOFENAC SODIUM (VOLTAREN) 1 % GEL  Apply 2 g topically 4 (four) times daily.   DOCUSATE SODIUM (COLACE) 100 MG CAPSULE    Take 1 capsule (100 mg total) by mouth 2 (two) times daily.   DOXAZOSIN (CARDURA) 1 MG TABLET    Take 2 tablets (2 mg total) by mouth daily.   GABAPENTIN (NEURONTIN) 400 MG CAPSULE    Take 1 capsule (400 mg total) by mouth 3 (three) times daily.   GLUCOSE BLOOD TEST STRIP    Use as directed 4 times daily.   HYDROCODONE-ACETAMINOPHEN (NORCO) 10-325 MG TABLET    Take 1 tablet by mouth every 6 (six) hours as needed for  severe pain.   INSULIN PEN NEEDLE (TRUEPLUS 5-BEVEL PEN NEEDLES) 29G X 12.7MM MISC    Use as directed in the morning, at noon, and at bedtime.   INSULIN SYRINGE-NEEDLE U-100 (ULTICARE INSULIN SYRINGE) 30G X 1/2" 1 ML MISC    USE TO INJECT INSULIN TWICE DAILY.   ISOSORBIDE-HYDRALAZINE (BIDIL) 20-37.5 MG TABLET    Take 2 tablets by mouth 3 (three) times daily.   MECLIZINE (ANTIVERT) 25 MG TABLET    Take 1 tablet (25 mg total) by mouth 3 (three) times daily as needed for dizziness.   METFORMIN (GLUCOPHAGE) 1000 MG TABLET    Take 1 tablet (1,000 mg total) by mouth 2 (two) times daily with a meal.   PANTOPRAZOLE (PROTONIX) 40 MG TABLET    Take 1 tablet (40 mg total) by mouth once daily.   ROSUVASTATIN (CRESTOR) 10 MG TABLET    Take 1 tablet (10 mg total) by mouth daily.   SACUBITRIL-VALSARTAN (ENTRESTO) 97-103 MG    Take 1 tablet by mouth 2 (two) times daily.   SEMAGLUTIDE, 1 MG/DOSE, 4 MG/3ML SOPN    Inject 1 mg into the skin once a week.   SERTRALINE (ZOLOFT) 100 MG TABLET    Take 1.5 tablets (150 mg total) by mouth daily.   SPIRONOLACTONE (ALDACTONE) 25 MG TABLET    Take 1 tablet (25 mg total) by mouth daily.   TORSEMIDE (DEMADEX) 20 MG TABLET    Take 1 tablet (20 mg total) by mouth daily as needed (If wt gain > 3 lb in 24 hr or > 5 lb in 1 week).   TRAMADOL HCL 100 MG TABS    Take 1 tablet (100 mg) by mouth every 6 (six) hours as needed for pain   TRAZODONE (DESYREL) 100 MG TABLET    Take 1 tablet (100 mg total) by mouth at bedtime.   WARFARIN (COUMADIN) 4 MG TABLET    Take 2 tablet (8 mg) daily or as directed by HF clinic   ZINC SULFATE 220 (50 ZN) MG TABS    Take 1 tablet (220 mg total) by mouth daily.  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  On arrival he had a temp 100.3, WBC 11.9 K CT showed fluid around lungs suspicious for infection, started on vancomycin and cefepime.  Wound cultures from OSH grew MSSA.  During hospitalization he underwent I&D x 2 with wound VAC placement.  He was initially transition to cefadroxil for LVAD as  a site infection.  Or cultures noted with no growth.  Patient discharged on cefazolin x 6 weeks EOT 12/25 transition to cefadroxil 500 mg p.o. twice daily for likely lifelong suppression 08/31/22: Pt reports he feels well. No issues with tolerance. Wound vac in place. Denies missed doses.   10/08/22: Started cefadroxil a couple days ago. IV abx stopped on 1225, PICC out. Denies fevers and chills.   Today 12/24/22: Review of Systems: ROS  Past Medical History:  Diagnosis Date   Anxiety    Aspirin allergy    Childhood asthma    Chronic systolic CHF (congestive heart failure) ([place])    a. EF 20-25% in 2012. b. EF 45-50% in 10/2011 with nonischemic nuc - presumed NICM. c. 12/2014 Echo: Sev depressed LV fxn, sev dil LV, mild LVH, mild MR, sev dil LA, mildly reduced RV fxn.   CKD (chronic kidney disease) stage 2, GFR 60-89 ml/min    H/O vasectomy 12/2019   High cholesterol    Hypertension    Morbid obesity ([place])    Nephrolithiasis    OSA on CPAP    Paroxysmal atrial fibrillation ([place])    Presumed NICM    a. 04/2014 Myoview: EF 26%, glob HK, sev glob HK, ? prior infarct;  b. Never cathed 2/2 CKD.   Renal cell carcinoma ([place])    a. s/p Rt robotic assisted partial converted to radical nephrectomy on 01/2013.   Troponin level elevated    a. 04/2014, 12/2014: felt due to CHF.   Type II diabetes mellitus (HCC)    Ventricular tachycardia (HCC)    a. appropriate ICD therapy 12/2017    Social History   Tobacco Use   Smoking status: Former    Packs/day: 0.25     Years: 22.00    Additional pack years: 0.00    Total pack years: 5.50    Types: Cigarettes    Quit date: 04/30/2015    Years since quitting: 7.6   Smokeless tobacco: Never  Vaping Use   Vaping Use: Never used  Substance Use Topics   Alcohol use: Not Currently    Comment: none   Drug use: Yes    Types: Marijuana    Family History  Problem Relation Age of Onset   Hypertension Mother    Diabetes Maternal Aunt    Heart attack Neg Hx    Stroke Neg Hx    Colon cancer Neg Hx    Pancreatic cancer Neg Hx    Stomach cancer Neg Hx    Liver cancer Neg Hx    Esophageal cancer Neg Hx     Allergies  Allergen Reactions   Aspirin Shortness Of Breath, Itching and Rash     Burning sensation (Patient reports he tolerates other NSAIDS)    Bee Venom Hives and Swelling   Lisinopril Cough   Shrimp (Diagnostic) Rash    Health Maintenance  Topic Date Due   COVID-19 Vaccine (1) Never done   Diabetic kidney evaluation - Urine ACR  02/17/2018   FOOT EXAM  11/22/2019   OPHTHALMOLOGY EXAM  07/18/2020   HEMOGLOBIN A1C  12/16/2022   INFLUENZA VACCINE  01/03/2023 (Originally 05/05/2022)   Diabetic kidney evaluation - eGFR measurement  12/11/2023   LIPID PANEL  12/11/2023   DTaP/Tdap/Td (2 - Td or Tdap) 04/06/2025   COLONOSCOPY (Pts 45-15yrs Insurance coverage will need to be confirmed)  02/11/2031   Hepatitis C Screening  Completed   HIV Screening  Completed   HPV VACCINES  Aged  Out    Objective:  There were no vitals filed for this visit. There is no height or weight on file to calculate BMI.  Physical Exam  Lab Results Lab Results  Component Value Date   WBC 5.8 12/11/2022   HGB 13.1 12/11/2022   HCT 40.5 12/11/2022   MCV 85.8 12/11/2022   PLT 226 12/11/2022    Lab Results  Component Value Date   CREATININE 1.34 (H) 12/11/2022   BUN 20 12/11/2022   NA 139 12/11/2022   K 3.8 12/11/2022   CL 109 12/11/2022   CO2 23 12/11/2022    Lab Results  Component Value Date   ALT 12  10/08/2022   AST 19 10/08/2022   ALKPHOS 45 09/11/2022   BILITOT 0.3 10/08/2022    Lab Results  Component Value Date   CHOL 148 12/11/2022   HDL 44 12/11/2022   LDLCALC 83 12/11/2022   TRIG 103 12/11/2022   CHOLHDL 3.4 12/11/2022   No results found for: "LABRPR", "RPRTITER" No results found for: "HIV1RNAQUANT", "HIV1RNAVL", "CD4TABS"   Problem List Items Addressed This Visit   None  Assessment/Plan 55 YM with: #LVAD exit site infection #ICD #Hx of MSSA from LVAD exit site #NICM status post HM 3 VAD  placement on 07/21/2021 -Admitted with LVAD exit site infection.  Bedside wound cultures grew MSSA.  Patient started on ceftriaxone. CT showed fluid attenuation around DL in subq fat c/w drive line infection, edema in abdominal wall. Bedside wound Cx+ staph aureus.  Taken to the OR  on 11/8, 11/14 both with negative cultures for debridement with Dr. Prescott Gum.  Per OR note on 11/8 there was tunneled infection with excision of infected tissue including fascia.  Pocket of fluid was drained.  Per OR note on 11/14 there is debridement of tunnel, rectus sheath, no pocket of fluid noticed.  AFB and fungal cultures were added. - Completed cefazolin x 6 weeks EOT 12/25(PICC out). Transitioned to cefadroxil 500 mg p.o. twice daily for chronic suppression --AFB cultures from 11/14 returned with no growth, AFB stain negative.  Fungal cultures with no growth with fungal stain negative.  He is continue on cefadroxil.  His last seen by Dr. Darcey Nora on 11/09/2022 noted that wound bed was clean honest, exit site healing and incorporated.  Wound does not tunnel, no drainage tenderness or rash. Plan: -Spoke with [facility] and something growing on AFB cx, result should finlize on 1/9 -Continue cefadroxil suppression for now -F/U pt on 1/11 or 1/16(based on apt availability) -labs today     Laurice Record, [facility] 12/24/2022, 8:31 AM

## 2022-12-24 NOTE — Progress Notes (Addendum)
VAD CLINIC Follow-up Visit  PCP: Fenton Foy, NP HF MD: DB   HPI:  Jeffrey [name] is a 47 y.o. male with a history of  poorly controlled HTN, R renal cell carcinoma s/p nephrectomy, CKD IIIa, DM2, OSA, gout, morbid obesity and systolic HF due to NICM. 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 D/c'd home with plan for 6 weeks IV Ancef with plan to transition to cefadroxil for long-term suppression on 09/28/22.   Here for f/u. On cefadroxil for chronic DL infection. Wound site healing well. Being followed by Dr. Prescott Gum. Site cleaned today. Denies orthopnea or PND. No fevers, chills or problems with driveline. No bleeding, melena or neuro symptoms. No VAD alarms. Taking all meds as prescribed.     VAD Indication: Destination Therapy due to smoking and BMI   VAD interrogation & Equipment Management: Speed: 6200 Flow: 4.4 Power: 5.1 w    PI: 5.7 Hct: 38   Alarms: none Events: 10 - 15 daily  Fixed speed 6200 Low speed limit: 5900   Primary Controller:  Replace back up battery in 14 months. Back up controller:   Replace back up battery in 21 months.   Annual Equipment Maintenance on UBC/PM was performed on 10/19/22.  Annual Equipment Maintenance on UBC/PM was performed on 10/19/22.    I reviewed the LVAD parameters from today and compared the results to the patient's prior recorded data. LVAD interrogation was NEGATIVE for significant power changes, NEGATIVE for clinical alarms and POSITIVE for PI events/speed drops. No programming changes were made and pump is functioning within specified parameters. Pt is performing daily controller and system monitor self tests along with completing weekly and monthly maintenance for LVAD equipment.   LVAD equipment check completed and is in good working order. Back-up equipment present.   Follow up for Heart Failure/LVAD:  Here for routine VAD f/u.  Continues on IV abx. Doing well. No fevers or chills. Wound vac working well. Stil with some pain at wound vac site. No drainage. Has intermittent dizziness and palpitations. Denies orthopnea or PND.  No bleeding, melena or neuro symptoms. No VAD alarms. Taking all meds as prescribed.      Past Medical History:  Diagnosis Date   Anxiety    Aspirin allergy    Childhood asthma    Chronic systolic CHF (congestive heart failure) ([place])    a. EF 20-25% in 2012. b. EF 45-50% in 10/2011 with nonischemic nuc - presumed NICM. c. 12/2014 Echo: Sev depressed LV fxn, sev dil LV, mild LVH, mild MR, sev dil LA, mildly reduced RV fxn.   CKD (chronic kidney disease) stage 2, GFR 60-89 ml/min    H/O vasectomy 12/2019   High cholesterol    Hypertension    Morbid obesity ([place])    Nephrolithiasis    OSA on CPAP    Paroxysmal atrial fibrillation ([place])    Presumed NICM    a. 04/2014 Myoview: EF 26%, glob HK, sev glob HK, ? prior infarct;  b. Never cathed 2/2 CKD.   Renal cell carcinoma ([place])    a. s/p Rt robotic assisted partial converted to radical nephrectomy on 01/2013.   Troponin level elevated    a. 04/2014, 12/2014: felt due to CHF.   Type II diabetes mellitus (HCC)    Ventricular tachycardia ([place])    a. appropriate ICD therapy 12/2017    Current Outpatient Medications  Medication Sig Dispense Refill   Accu-Chek FastClix Lancets MISC USE AS DIRECTED FOUR TIMES DAILY 102 each 11   acetaminophen (TYLENOL) 325 MG tablet Take 2 tablets (650 mg total) by mouth every 6 (six) hours as needed for fever.     allopurinol (ZYLOPRIM) 100 MG tablet Take 1 tablet (100 mg total) by mouth daily. 90 tablet 3   amiodarone (PACERONE) 200 MG tablet Take 1 tablet (200 mg total) by mouth daily. 30 tablet 6   amLODipine (NORVASC) 10 MG tablet Take 1 tablet (10 mg total) by mouth daily 30 tablet 6   cefadroxil (DURICEF) 500 MG capsule Take 1 capsule (500 mg total) by mouth 2 (two) times daily. 180 capsule 3   colchicine 0.6  MG tablet Take 1 tablet (0.6 mg total) by mouth once daily. 30 tablet 6   diclofenac Sodium (VOLTAREN) 1 % GEL Apply 2 g topically 4 (four) times daily. (Patient taking differently: Apply 2 g topically 4 (four) times daily as needed (pain).) 100 g 1   doxazosin (CARDURA) 1 MG tablet Take 2 tablets (2 mg total) by mouth daily. 60 tablet 3   gabapentin (NEURONTIN) 400 MG capsule Take 1 capsule (400 mg total) by mouth 3 (three) times daily. 30 capsule 6   glucose blood test strip Use as directed 4 times daily. 100 each 11   Insulin Pen Needle (TRUEPLUS 5-BEVEL PEN NEEDLES) 29G X 12.7MM MISC Use as directed in the morning, at noon, and at bedtime. 100 each 3   Insulin Syringe-Needle U-100 (ULTICARE INSULIN SYRINGE) 30G X 1/2" 1 ML MISC USE TO INJECT INSULIN TWICE DAILY. 100 each 11   metFORMIN (GLUCOPHAGE) 1000 MG tablet Take 1 tablet (1,000 mg total) by mouth 2 (two) times daily with a meal. 60 tablet 11   pantoprazole (PROTONIX) 40 MG tablet Take 1 tablet (40 mg total) by mouth once daily. 90 tablet 3   rosuvastatin (CRESTOR) 10 MG tablet Take 1 tablet (10 mg total) by mouth daily. 90 tablet 3   sacubitril-valsartan (ENTRESTO) 97-103 MG Take 1 tablet by mouth 2 (two) times daily. 60 tablet 6   Semaglutide, 1 MG/DOSE, 4 MG/3ML SOPN Inject 1 mg into the skin once a week. 3 mL 0   sertraline (ZOLOFT) 100 MG tablet Take 1.5 tablets (150 mg total) by mouth daily. 45 tablet 6   spironolactone (ALDACTONE) 25 MG tablet Take 1 tablet (25 mg total) by mouth daily. 30 tablet 6   traMADol HCl 100 MG TABS Take 1 tablet (100 mg) by mouth every 6 (six) hours as needed for pain 90 tablet 0   traZODone (DESYREL) 100 MG tablet Take 1 tablet (100 mg total) by mouth at bedtime. 30 tablet 11   warfarin (COUMADIN) 4 MG tablet Take 2 tablet (8 mg) daily or as directed by HF clinic (Patient taking differently: Take 8 mg by mouth daily at 4 PM. Take 3 tablets (12 mg) on Tuesdays, and 2 tablet (8 mg) all other days, or as  directed by HF clinic) 90 tablet 11   Zinc Sulfate 220 (50 Zn) MG TABS Take 1 tablet (220 mg total) by mouth daily. 30 tablet 6   docusate sodium (COLACE) 100 MG capsule Take 1 capsule (100 mg total) by mouth 2 (two) times daily. (Patient not taking: Reported on 12/11/2022) 10 capsule 0   HYDROcodone-acetaminophen (NORCO) 10-325 MG tablet Take 1 tablet by mouth every 6 (six) hours as needed for severe pain. (Patient not taking: Reported on 10/19/2022)  20 tablet 0   isosorbide-hydrALAZINE (BIDIL) 20-37.5 MG tablet Take 2 tablets by mouth 3 (three) times daily. 180 tablet 6   meclizine (ANTIVERT) 25 MG tablet Take 1 tablet (25 mg total) by mouth 3 (three) times daily as needed for dizziness. (Patient not taking: Reported on 10/19/2022) 90 tablet 2   torsemide (DEMADEX) 20 MG tablet Take 1 tablet (20 mg total) by mouth daily as needed (If wt gain > 3 lb in 24 hr or > 5 lb in 1 week). (Patient not taking: Reported on 10/19/2022) 30 tablet 2   No current facility-administered medications for this encounter.    Aspirin, Bee venom, Lisinopril, and Shrimp (diagnostic)  REVIEW OF SYSTEMS: All systems negative except as listed in HPI, PMH and Problem list.   Vitals:   12/11/22 1158 12/11/22 1159  BP: (!) 136/96 (!) 130/0  Pulse: 67   SpO2: 95%     Vital Signs:  Doppler Pressure: 130 Automatc BP: 136/96 (113) HR: 67 SPO2: 95% on RA   Weight: 290.2 lb w/ eqt Last weight:  292.4 lbs w/eqt   Exam: General:  NAD.  HEENT: normal  Neck: supple. JVP not elevated.  Carotids 2+ bilat; no bruits. No lymphadenopathy or thryomegaly appreciated. Cor: LVAD hum.  Lungs: Clear. Abdomen: obese soft, nontender, non-distended. No hepatosplenomegaly. No bruits or masses. Good bowel sounds. Driveline site dressed looks good. Anchor in place.  Extremities: no cyanosis, clubbing, rash. Warm no edema  Neuro: alert & oriented x 3. No focal deficits. Moves all 4 without problem    ASSESSMENT AND PLAN:  .1.  Chronic systolic CHF - Nonischemic CM, end-stage - EF 2012; 20-25%  - Echo  8/22: EF < 20%, severe LV dilation, moderate RV dysfunction.   - RHC (8/22): well compensated hemodynamics. - s/p S-ICD - S/p HM3 LVAD 10/17 (DT) - Doing well NYHA I - Volume status ok - Continue Bidil 2 tabs tid - Continue spironolactone 25 mg daily  - Continue entresto to 97/130 bid - No change in meds today - Again stressed need to continue weight loss efforts (see below). Resume Ozempic. Once BMI closer to 35 (and DL infection cleared) will refer to [facility] for begin transplant evaluation   2. VAD  - s/p HM-3 placement 07/21/21  - VAD interrogated personally. Parameters stable. - INR 2.5 Goal INR 2.0-3.0 Discussed dosing with PharmD personally. - LDH 157 - DL site much improved. Continue wound care with Dr. Prescott Gum - Hgb 13.1  3. DL site infection - Much improved. - Dressing changed today with Dr. Prescott Gum - Continue cefadroxil for long-term suppression  4 Atrial fibrillation: Paroxysmal - Had severe AF with RVR 7/22 s/p DC-CV x 2 - Seen by EP. Not a candidate for ablation due to body habitus  (could consider AVN ablation and CRT, if needed)  - Had post-op AF with RVR post VAD - 10/23 S/P DC-CV with restoration of NSR. - Remains in NSR Continue amio 100 daily. No change - On warfarin. INR management as above  5. HTN - MAPs ok  6. CKD stage 3B  - Has solitary kidney.   - Baseline creatinine is 1.5-1.8.  - Creatinine 1.34 today   7. OSA - Continue CPAP   8. DM2  - On Jardiance. - Hgb A1c 9.4. -> 5.9 - PCP following - Will start statin - Crestor 10  9. Polyarticular gout. - quiescent  10. Depression/anxiety - Continue Zoloft 150mg  daily - Much improved  11. Morbid obesity -  There is no height or weight on file to calculate BMI. - Resume GLP1RA  12.  Palpitations/dizziness - Zio 12/23 - 77 runs SVT. Continue amio. PVCs 1.9% PACs 4.7% - resolved  Total time spent 35 minutes.  Over half that time spent discussing above.   Glori Bickers, MD  4:28 PM

## 2022-12-25 ENCOUNTER — Other Ambulatory Visit: Payer: Self-pay

## 2022-12-28 ENCOUNTER — Other Ambulatory Visit: Payer: Self-pay

## 2022-12-28 ENCOUNTER — Telehealth: Payer: Self-pay | Admitting: Pharmacist

## 2022-12-28 NOTE — Telephone Encounter (Signed)
Called pt to f/u with Ozempic tolerability, if tolerating well will increase dose back to 2mg  weekly maintenance dose. No answer, left message.

## 2022-12-29 ENCOUNTER — Encounter: Payer: Self-pay | Admitting: Obstetrics and Gynecology

## 2022-12-29 ENCOUNTER — Other Ambulatory Visit: Payer: Medicaid Other | Admitting: Obstetrics and Gynecology

## 2022-12-29 ENCOUNTER — Other Ambulatory Visit: Payer: Self-pay

## 2022-12-29 MED ORDER — OZEMPIC (2 MG/DOSE) 8 MG/3ML ~~LOC~~ SOPN
2.0000 mg | PEN_INJECTOR | SUBCUTANEOUS | 11 refills | Status: DC
  Filled 2022-12-29 – 2023-04-07 (×4): qty 3, 28d supply, fill #0

## 2022-12-29 NOTE — Patient Outreach (Signed)
Medicaid Managed Care   Nurse Care Manager Note  12/29/2022 Name:  Jeffrey [name] MRN:  [id number] DOB:  10-Feb-1976  Jeffrey [name] is an 47 y.o. year old male who is a primary patient of Jeffrey Foy, NP.  The [facility] Managed Care Coordination team was consulted for assistance with:    Chronic healthcare management needs, DM, HTN, OSA, CHF, DLD, CKD, gout, h/o renal cancer, HLD, depression, Afib, VAD  [name] was given information about Medicaid Managed Care Coordination team services today. Jeffrey [name] Patient agreed to services and verbal consent obtained.  Engaged with patient by telephone for follow up visit in response to provider referral for case management and/or care coordination services.   Assessments/Interventions:  Review of past medical history, allergies, medications, health status, including review of consultants reports, laboratory and other test data, was performed as part of comprehensive evaluation and provision of chronic care management services.  SDOH (Social Determinants of Health) assessments and interventions performed: SDOH Interventions    Flowsheet Row Patient Outreach Telephone from 12/29/2022 in [place] Patient Outreach Telephone from 12/08/2022 in [place] Patient Outreach Telephone from 10/30/2022 in [place] Patient Outreach Telephone from 10/12/2022 in [place] Patient Outreach Telephone from 10/02/2022 in [place] Patient Outreach Telephone from 07/30/2022 in [place] Coordination  SDOH Interventions        Transportation Interventions -- Intervention Not Indicated -- -- -- --  Utilities Interventions -- -- -- -- -- Intervention Not Indicated  Alcohol Usage Interventions Intervention Not Indicated (Score <7) -- -- Intervention Not Indicated (Score <7) -- Intervention Not  Indicated (Score <7)  Financial Strain Interventions -- -- Other (Comment)  [patient working with BSW] -- Other (Comment)  [referral placed] --  Physical Activity Interventions -- Intervention Not Indicated -- -- -- Intervention Not Indicated  Stress Interventions -- -- Other (Comment)  [SW following-concerns over wait for disability and bills due.] -- Other (Comment)  [referral placed for BSW-patient will also f/u with SW at VAD Clinic] --     Care Plan  Allergies  Allergen Reactions   Aspirin Shortness Of Breath, Itching and Rash     Burning sensation (Patient reports he tolerates other NSAIDS)    Bee Venom Hives and Swelling   Lisinopril Cough   Shrimp (Diagnostic) Rash    Medications Reviewed Today     Reviewed by Jeffrey Medicus, RN (Registered Nurse) on 12/29/22 at 1024  Med List Status: <None>   Medication Order Taking? Sig Documenting Provider Last Dose Status Informant  Accu-Chek FastClix Lancets MISC [id number] No USE AS DIRECTED FOUR TIMES DAILY [name], Jeffrey Pascal, MD Taking Active   acetaminophen (TYLENOL) 325 MG tablet [id number] No Take 2 tablets (650 mg total) by mouth every 6 (six) hours as needed for fever. Jeffrey Grinder D, NP Taking Active Self, Pharmacy Records  allopurinol (ZYLOPRIM) 100 MG tablet [id number] No Take 1 tablet (100 mg total) by mouth daily. [name], Jeffrey Pascal, MD Taking Active Self, Pharmacy Records  amiodarone (PACERONE) 200 MG tablet [id number] No Take 1 tablet (200 mg total) by mouth daily. [name], Jeffrey Pascal, MD Taking Active Self, Pharmacy Records  amLODipine (NORVASC) 10 MG tablet [id number] No Take 1 tablet (10 mg total) by mouth daily [name], Jeffrey Pascal, MD Taking Active Self, Pharmacy Records  cefadroxil (DURICEF) 500 MG capsule [id number] No Take 1 capsule (500 mg total) by  mouth 2 (two) times daily. [name], Jeffrey Pascal, MD Taking Active   colchicine 0.6 MG tablet [id number] No Take 1 tablet (0.6 mg total) by mouth once daily. [name], Jeffrey Pascal, MD  Taking Active Self, Pharmacy Records  diclofenac Sodium (VOLTAREN) 1 % GEL [id number] No Apply 2 g topically 4 (four) times daily.  Patient taking differently: Apply 2 g topically 4 (four) times daily as needed (pain).   Jeffrey Ribas, MD Taking Active Self, Pharmacy Records  docusate sodium (COLACE) 100 MG capsule [id number] No Take 1 capsule (100 mg total) by mouth 2 (two) times daily.  Patient not taking: Reported on 12/11/2022   Jeffrey [place], NP Not Taking Active Self, Pharmacy Records  doxazosin (CARDURA) 1 MG tablet [id number] No Take 2 tablets (2 mg total) by mouth daily. [name], Jeffrey Pascal, MD Taking Active   gabapentin (NEURONTIN) 400 MG capsule [id number] No Take 1 capsule (400 mg total) by mouth 3 (three) times daily. [name], Jeffrey Pascal, MD Taking Active   glucose blood test strip [id number] No Use as directed 4 times daily. Jeffrey Merino I, NP Taking Active Self, Pharmacy Records  HYDROcodone-acetaminophen [facility]) 10-325 MG tablet [id number] No Take 1 tablet by mouth every 6 (six) hours as needed for severe pain.  Patient not taking: Reported on 10/19/2022   [name], Jeffrey Pascal, MD Not Taking Active   Insulin Pen Needle (TRUEPLUS 5-BEVEL PEN NEEDLES) 29G X 12.7MM MISC [id number] No Use as directed in the morning, at noon, and at bedtime. Jeffrey Dunk, NP Taking Active Self, Pharmacy Records  Insulin Syringe-Needle U-100 (ULTICARE INSULIN SYRINGE) 30G X 1/2" 1 ML MISC [id number] No USE TO INJECT INSULIN TWICE DAILY. [name], Jeffrey Pascal, MD Taking Active   isosorbide-hydrALAZINE (BIDIL) 20-37.5 MG tablet [id number]  Take 2 tablets by mouth 3 (three) times daily. [name], Jeffrey Pascal, MD  Active   meclizine (ANTIVERT) 25 MG tablet [id number] No Take 1 tablet (25 mg total) by mouth 3 (three) times daily as needed for dizziness.  Patient not taking: Reported on 10/19/2022   [name], Jeffrey Pascal, MD Not Taking Active Self, Pharmacy Records  metFORMIN (GLUCOPHAGE) 1000 MG tablet [id number] No  Take 1 tablet (1,000 mg total) by mouth 2 (two) times daily with a meal. [name], Jeffrey Pascal, MD Taking Active   pantoprazole (PROTONIX) 40 MG tablet [id number] No Take 1 tablet (40 mg total) by mouth once daily. [name], Jeffrey Pascal, MD Taking Active Self, Pharmacy Records  rosuvastatin (CRESTOR) 10 MG tablet [id number]  Take 1 tablet (10 mg total) by mouth daily. [name], Jeffrey Pascal, MD  Active   sacubitril-valsartan (ENTRESTO) 97-103 MG [id number] No Take 1 tablet by mouth 2 (two) times daily. [name], Jeffrey Pascal, MD Taking Active Self, Pharmacy Records  Semaglutide, 1 MG/DOSE, 4 MG/3ML SOPN [id number] No Inject 1 mg into the skin once a week. [name], Jeffrey Pascal, MD Taking Active   sertraline (ZOLOFT) 100 MG tablet [id number] No Take 1.5 tablets (150 mg total) by mouth daily. [name], Jeffrey Pascal, MD Taking Active Self, Pharmacy Records  spironolactone (ALDACTONE) 25 MG tablet [id number] No Take 1 tablet (25 mg total) by mouth daily. [name], Jeffrey Pascal, MD Taking Active   torsemide (DEMADEX) 20 MG tablet [id number] No Take 1 tablet (20 mg total) by mouth daily as needed (If wt gain > 3 lb in 24 hr or > 5 lb in 1 week).  Patient not taking: Reported on 10/19/2022   Jeffrey Foy, NP Not Taking Active Self, Pharmacy Records  traMADol HCl 100 MG TABS [id number] No Take 1 tablet (100 mg) by mouth every 6 (six) hours as needed for pain [name], Jeffrey Pascal, MD Taking Active   traZODone (DESYREL) 100 MG tablet [id number] No Take 1 tablet (100 mg total) by mouth at bedtime. [name], Jeffrey Pascal, MD Taking Active Self, Pharmacy Records  warfarin (COUMADIN) 4 MG tablet [id number] No Take 2 tablet (8 mg) daily or as directed by HF clinic  Patient taking differently: Take 8 mg by mouth daily at 4 PM. Take 3 tablets (12 mg) on Tuesdays, and 2 tablet (8 mg) all other days, or as directed by HF clinic   [name], Jeffrey Pascal, MD Taking Active            Med Note Darcel Smalling, Nancee Liter   Fri Dec 11, 2022 11:56 AM)    Zinc  Sulfate 220 (50 Zn) MG TABS [id number] No Take 1 tablet (220 mg total) by mouth daily. [name], Jeffrey Pascal, MD Taking Active            Patient Active Problem List   Diagnosis Date Noted   Infection associated with driveline of left ventricular assist device (LVAD) ([place]) 08/10/2022   Presence of left ventricular assist device (LVAD) ([place]) 07/25/2021   Acute on chronic systolic CHF (congestive heart failure) ([place]) 07/18/2021   Cardiogenic shock (HCC)    Atrial fibrillation with RVR ([place]) 04/30/2021   Acute hypoxemic respiratory failure (HCC)    Right wrist pain 04/16/2021   Absolute anemia    Rectal bleeding    Iron deficiency 12/25/2020   Long term current use of anticoagulant - apixaban 12/25/2020   Paroxysmal atrial fibrillation (HCC)    Lactic acidosis 11/22/2020   Hypotension 11/22/2020   Aspiration into airway    ICD (implantable cardioverter-defibrillator) battery depletion 05/15/2020   Endotracheal tube present    Respiratory failure ([place])    Acute encephalopathy    Acute pulmonary edema (HCC)    NICM (nonischemic cardiomyopathy) ([place]) 04/24/2020   S/P vasectomy 12/20/2019   Anxiety 12/20/2019   Hemoglobin A1C between 7% and 9% indicating borderline diabetic control ([place]) 12/20/2019   Seasonal allergies 12/20/2019   Insomnia 12/20/2019   Vasectomy evaluation 06/20/2019   Visual problems 06/20/2019   Blurry vision, bilateral 06/20/2019   Hyperglycemia 02/13/2019   Hyperosmolar (nonketotic) coma ([place]) 01/19/2019   AICD (automatic cardioverter/defibrillator) present 01/19/2019   Hyperlipidemia 12/07/2018   Follow-up exam 11/22/2018   Class 3 severe obesity due to excess calories with serious comorbidity and body mass index (BMI) of 45.0 to 49.9 in adult ([place]) 11/22/2018   Dizziness 11/22/2018   Lingular pneumonia 08/04/2018   Ventricular tachycardia ([place]) 12/30/2017   PAF (paroxysmal atrial fibrillation) ([place])    Dilated cardiomyopathy ([place])    Pollen allergy  02/17/2017   Dyslipidemia 02/17/2017   Hematochezia 11/08/2015   CKD (chronic kidney disease), stage III ([place]) 06/20/2015   Cardiac defibrillator in situ    Chronic systolic CHF (congestive heart failure) ([place]) 03/11/2015   Gouty arthritis 03/11/2015   Chest pain 12/28/2014   Acute renal failure superimposed on stage 3 chronic kidney disease ([place]) 04/08/2014   Aspirin allergy 04/08/2014   Renal cell carcinoma ([place])    Gout attack 11/10/2011   Morbid obesity ([place]) 10/23/2011   OSA (obstructive sleep apnea) 10/23/2011   Type 2 diabetes mellitus with stage 3 chronic kidney disease, with long-term current use of insulin ([place]) 10/22/2011   Hyperlipidemia associated with type 2 diabetes mellitus ([place]) 12/25/2010  Hypertension associated with diabetes ([place]) 12/25/2010   Conditions to be addressed/monitored per PCP order:  Chronic healthcare management needs, DM, HTN, OSA, CHF, DLD, CKD, gout, h/o renal cancer, HLD, depression, Afib, VAD  Care Plan : General Plan of Care (Adult)  Updates made by Jeffrey Medicus, RN since 12/29/2022 12:00 AM     Problem: Health Promotion or Disease Self-Management (General Plan of Care)   Priority: High  Onset Date: 01/29/2023  Note:   Current Barriers:  Chronic Disease Management support and education needs, S/P LVAD 07/21/21, DM, HTN, OSA, CHF, Afib, CKD, gout, DDD, HLD, depression,  h/o renal Ca 12/29/22:  Patient with no complaints today-concerned about losing Medicaid-has provided needed documentation and is waiting to hear.  Patient now doing dressing changes at home at LVAD site.  Does not check BP or BG.  Continues on Ozempic to lose weight.  Nurse Case Manager Clinical Goal(s):  Over the next 90 days, patient will attend all scheduled medical appointments: Over the next 30 days, patient will have dental appt scheduled  Interventions:  Inter-disciplinary care team collaboration (see longitudinal plan of care) Collaborated with BSW  BSW referral for  disability information-completed Evaluation of current treatment plan and patient's adherence to plan as established by provider. .  BSW completed a telephone outreach with patient, he stated he applied for disability over a year ago and has not heard anything back yet. Every time he calls he is on hold for over 2 hours and the phone hangs up. BSW informed patient it can take a long time to get a decision from disability. BSW provided patient with the telephone number for legal aid to assist with the process. Patient states he was able to be added to his wife's foodstamp case, no other resources are needed at this time. Reviewed medications with patient. Discussed plans with patient for ongoing care management follow up and provided patient with direct contact information for care management team Provided patient with educational materials related to exercise. Reviewed scheduled/upcoming provider appointments. Collaborated with Pharmacy Pharmacy referral for medication review 12/08/22:  returned patient's phone call   Patient Goals/Self-Care Activities Over the next 90 days, patient will:  -Attends all scheduled provider appointments Calls provider office for new concerns or questionss  Follow Up Plan: The Managed Medicaid care management team will reach out to the patient again over the next 45 business  days.  The patient has been provided with contact information for the Managed Medicaid care management team and has been advised to call with any health related questions or concerns.    Follow Up:  Patient agrees to Care Plan and Follow-up.  Plan: The Managed Medicaid care management team will reach out to the patient again over the next 45 business  days. and The  Patient has been provided with contact information for the Managed Medicaid care management team and has been advised to call with any health related questions or concerns.  Date/time of next scheduled RN care management/care  coordination outreach: 02/15/23 at 1230

## 2022-12-29 NOTE — Telephone Encounter (Signed)
Called pt again, tolerating ozempic 1mg  well, has last dose this Saturday. Sent in rx for 2mg  maintenance dose.

## 2022-12-29 NOTE — Patient Instructions (Signed)
Visit Information  Mr. Piehler was given information about Medicaid Managed Care team care coordination services as a part of their Healthy [facility] Medicaid benefit. [name] verbally consented to engagement with the [facility] Managed Care team.   If you are experiencing a medical emergency, please call 911 or report to your local emergency department or urgent care.   If you have a non-emergency medical problem during routine business hours, please contact your provider's office and ask to speak with a nurse.   For questions related to your Healthy [facility] health plan, please call: [phone] or visit the homepage here: [email or web address]  If you would like to schedule transportation through your Healthy [facility] plan, please call the following number at least 2 days in advance of your appointment: [phone]  For information about your ride after you set it up, call Ride Assist at [phone]. Use this number to activate a Will Call pickup, or if your transportation is late for a scheduled pickup. Use this number, too, if you need to make a change or cancel a previously scheduled reservation.  If you need transportation services right away, call [phone]. The after-hours call center is staffed 24 hours to handle ride assistance and urgent reservation requests (including discharges) 365 days a year. Urgent trips include sick visits, hospital discharge requests and life-sustaining treatment.  Call the [place] at [phone], at any time, 24 hours a day, 7 days a week. If you are in danger or need immediate medical attention call 911.  If you would like help to quit smoking, call 1-800-QUIT-NOW 207-187-9119) OR Espaol: 1-855-Djelo-Ya HD:1601594) o para ms informacin haga clic aqu or Text READY to 200-400 to register via text  Mr. Librizzi - following are the goals we discussed in your visit today:   Goals  Addressed             This Visit's Progress    Protect My Health       Timeframe:  Long-Range Goal Priority:  High Start Date:    09/24/20                         Expected End Date:  ongoing             - schedule recommended health tests  - schedule and keep appointment for annual check-up  12/29/22:  patient up to date on appts-seen by [facility] V[facility] 3/8.  Has ID appt 4/10   Patient verbalizes understanding of instructions and care plan provided today and agrees to view in [name]. Active MyChart status and patient understanding of how to access instructions and care plan via MyChart confirmed with patient.     The Managed Medicaid care management team will reach out to the patient again over the next 45 business  days.  The  Patient  has been provided with contact information for the Managed Medicaid care management team and has been advised to call with any health related questions or concerns.   [name] RN, BSN [place] Management Coordinator - Managed Medicaid High Risk [phone]   Following is a copy of your plan of care:  Care Plan : General Plan of Care (Adult)  Updates made by Gayla Medicus, RN since 12/29/2022 12:00 AM     Problem: Health Promotion or Disease Self-Management (General Plan of Care)   Priority: High  Onset Date: 01/29/2023  Note:   Current Barriers:  Chronic Disease Management support and education needs, S/P LVAD 07/21/21, DM, HTN, OSA, CHF, Afib, CKD, gout, DDD, HLD, depression,  h/o renal Ca 12/29/22:  Patient with no complaints today-concerned about losing Medicaid-has provided needed documentation and is waiting to hear.  Patient now doing dressing changes at home at LVAD site.  Does not check BP or BG.  Continues on Ozempic to lose weight.  Nurse Case Manager Clinical Goal(s):  Over the next 90 days, patient will attend all scheduled medical appointments: Over the next 30 days, patient will have  dental appt scheduled  Interventions:  Inter-disciplinary care team collaboration (see longitudinal plan of care) Collaborated with BSW  BSW referral for disability information-completed Evaluation of current treatment plan and patient's adherence to plan as established by provider. .  BSW completed a telephone outreach with patient, he stated he applied for disability over a year ago and has not heard anything back yet. Every time he calls he is on hold for over 2 hours and the phone hangs up. BSW informed patient it can take a long time to get a decision from disability. BSW provided patient with the telephone number for legal aid to assist with the process. Patient states he was able to be added to his wife's foodstamp case, no other resources are needed at this time. Reviewed medications with patient. Discussed plans with patient for ongoing care management follow up and provided patient with direct contact information for care management team Provided patient with educational materials related to exercise. Reviewed scheduled/upcoming provider appointments. Collaborated with Pharmacy Pharmacy referral for medication review 12/08/22:  returned patient's phone call   Patient Goals/Self-Care Activities Over the next 90 days, patient will:  -Attends all scheduled provider appointments Calls provider office for new concerns or questionss  Follow Up Plan: The Managed Medicaid care management team will reach out to the patient again over the next 45 business  days.  The patient has been provided with contact information for the Managed Medicaid care management team and has been advised to call with any health related questions or concerns.

## 2022-12-30 ENCOUNTER — Ambulatory Visit (HOSPITAL_COMMUNITY)
Admission: RE | Admit: 2022-12-30 | Discharge: 2022-12-30 | Disposition: A | Discharge status: Home / Self Care | Payer: Medicaid Other | Source: Ambulatory Visit | Attending: Internal Medicine | Admitting: Internal Medicine

## 2022-12-30 ENCOUNTER — Ambulatory Visit (HOSPITAL_COMMUNITY): Payer: Self-pay | Admitting: Pharmacist

## 2022-12-30 DIAGNOSIS — Z4801 Encounter for change or removal of surgical wound dressing: Secondary | ICD-10-CM | POA: Insufficient documentation

## 2022-12-30 DIAGNOSIS — Z7901 Long term (current) use of anticoagulants: Secondary | ICD-10-CM | POA: Diagnosis not present

## 2022-12-30 DIAGNOSIS — Z95811 Presence of heart assist device: Secondary | ICD-10-CM | POA: Diagnosis not present

## 2022-12-30 LAB — PROTIME-INR
INR: 1.9 — ABNORMAL HIGH (ref 0.8–1.2)
Prothrombin Time: 21.9 seconds — ABNORMAL HIGH (ref 11.4–15.2)

## 2023-01-04 ENCOUNTER — Other Ambulatory Visit: Payer: Self-pay

## 2023-01-04 NOTE — Progress Notes (Signed)
Remote ICD transmission.   

## 2023-01-05 ENCOUNTER — Other Ambulatory Visit: Payer: Medicaid Other

## 2023-01-05 NOTE — Patient Outreach (Signed)
Medicaid Managed Care Social Work Note  01/05/2023 Name:  Jeffrey [name] MRN:  [id number] DOB:  08/12/1976  Jeffrey [name] is an 47 y.o. year old male who is a primary patient of Fenton Foy, NP.  The Medicaid Managed Care Coordination team was consulted for assistance with:  Community Resources   [name] was given information about Medicaid Managed Care Coordination team services today. Jeffrey [name] Patient agreed to services and verbal consent obtained.  Engaged with patient  for by telephone forfollow up visit in response to referral for case management and/or care coordination services.   Assessments/Interventions:  Review of past medical history, allergies, medications, health status, including review of consultants reports, laboratory and other test data, was performed as part of comprehensive evaluation and provision of chronic care management services.  SDOH: (Social Determinant of Health) assessments and interventions performed: SDOH Interventions    Flowsheet Row Patient Outreach Telephone from 12/29/2022 in [place] Patient Outreach Telephone from 12/08/2022 in [place] Patient Outreach Telephone from 10/30/2022 in [place] Patient Outreach Telephone from 10/12/2022 in [place] Patient Outreach Telephone from 10/02/2022 in [place] Patient Outreach Telephone from 07/30/2022 in [place] Coordination  SDOH Interventions        Transportation Interventions -- Intervention Not Indicated -- -- -- --  Utilities Interventions -- -- -- -- -- Intervention Not Indicated  Alcohol Usage Interventions Intervention Not Indicated (Score <7) -- -- Intervention Not Indicated (Score <7) -- Intervention Not Indicated (Score <7)  Financial Strain Interventions -- -- Other (Comment)  [patient working with BSW] -- Other  (Comment)  [referral placed] --  Physical Activity Interventions -- Intervention Not Indicated -- -- -- Intervention Not Indicated  Stress Interventions -- -- Other (Comment)  [SW following-concerns over wait for disability and bills due.] -- Other (Comment)  [referral placed for BSW-patient will [facility]] --     BSW completed a telephone outreach with patient. He stated he did receive a letter stating that his wife made too much money for him to have Medicaid, but she received a letter stating that she qualified for Medicaid. Patient stated that he was confused but did contact his worker. BSW encouraged patient to wait for his Medicaid worker to give him a call back. Patient understood. No resources are needed at this time.  Advanced Directives Status:  Not addressed in this encounter.  Care Plan                 Allergies  Allergen Reactions   Aspirin Shortness Of Breath, Itching and Rash     Burning sensation (Patient reports he tolerates other NSAIDS)    Bee Venom Hives and Swelling   Lisinopril Cough   Shrimp (Diagnostic) Rash    Medications Reviewed Today     Reviewed by Gayla Medicus, RN (Registered Nurse) on 12/29/22 at 1024  Med List Status: <None>   Medication Order Taking? Sig Documenting Provider Last Dose Status Informant  Accu-Chek FastClix Lancets MISC [id number] No USE AS DIRECTED FOUR TIMES DAILY Bensimhon, Shaune Pascal, MD Taking Active   acetaminophen (TYLENOL) 325 MG tablet [id number] No Take 2 tablets (650 mg total) by mouth every 6 (six) hours as needed for fever. Conrad Buchanan [place], NP Taking Active Self, Pharmacy Records  allopurinol (ZYLOPRIM) 100 MG tablet [id number] No Take 1 tablet (100 mg total)  by mouth daily. Bensimhon, Shaune Pascal, MD Taking Active Self, Pharmacy Records  amiodarone (PACERONE) 200 MG tablet [id number] No Take 1 tablet (200 mg total) by mouth daily. Bensimhon, Shaune Pascal, MD Taking Active Self, Pharmacy Records  amLODipine (NORVASC) 10  MG tablet [id number] No Take 1 tablet (10 mg total) by mouth daily Bensimhon, Shaune Pascal, MD Taking Active Self, Pharmacy Records  cefadroxil (DURICEF) 500 MG capsule [id number] No Take 1 capsule (500 mg total) by mouth 2 (two) times daily. Bensimhon, Shaune Pascal, MD Taking Active   colchicine 0.6 MG tablet [id number] No Take 1 tablet (0.6 mg total) by mouth once daily. Bensimhon, Shaune Pascal, MD Taking Active Self, Pharmacy Records  diclofenac Sodium (VOLTAREN) 1 % GEL [id number] No Apply 2 g topically 4 (four) times daily.  Patient taking differently: Apply 2 g topically 4 (four) times daily as needed (pain).   Izora Ribas, MD Taking Active Self, Pharmacy Records  docusate sodium (COLACE) 100 MG capsule [id number] No Take 1 capsule (100 mg total) by mouth 2 (two) times daily.  Patient not taking: Reported on 12/11/2022   Conrad [place], NP Not Taking Active Self, Pharmacy Records  doxazosin (CARDURA) 1 MG tablet [id number] No Take 2 tablets (2 mg total) by mouth daily. Bensimhon, Shaune Pascal, MD Taking Active   gabapentin (NEURONTIN) 400 MG capsule [id number] No Take 1 capsule (400 mg total) by mouth 3 (three) times daily. Bensimhon, Shaune Pascal, MD Taking Active   glucose blood test strip [id number] No Use as directed 4 times daily. Bo Merino I, NP Taking Active Self, Pharmacy Records  HYDROcodone-acetaminophen [facility]) 10-325 MG tablet [id number] No Take 1 tablet by mouth every 6 (six) hours as needed for severe pain.  Patient not taking: Reported on 10/19/2022   Bensimhon, Shaune Pascal, MD Not Taking Active   Insulin Pen Needle (TRUEPLUS 5-BEVEL PEN NEEDLES) 29G X 12.7MM MISC [id number] No Use as directed in the morning, at noon, and at bedtime. Teena Dunk, NP Taking Active Self, Pharmacy Records  Insulin Syringe-Needle U-100 (ULTICARE INSULIN SYRINGE) 30G X 1/2" 1 ML MISC [id number] No USE TO INJECT INSULIN TWICE DAILY. Bensimhon, Shaune Pascal, MD Taking Active   isosorbide-hydrALAZINE (BIDIL) 20-37.5 MG  tablet [id number]  Take 2 tablets by mouth 3 (three) times daily. Bensimhon, Shaune Pascal, MD  Active   meclizine (ANTIVERT) 25 MG tablet [id number] No Take 1 tablet (25 mg total) by mouth 3 (three) times daily as needed for dizziness.  Patient not taking: Reported on 10/19/2022   Bensimhon, Shaune Pascal, MD Not Taking Active Self, Pharmacy Records  metFORMIN (GLUCOPHAGE) 1000 MG tablet [id number] No Take 1 tablet (1,000 mg total) by mouth 2 (two) times daily with a meal. Bensimhon, Shaune Pascal, MD Taking Active   pantoprazole (PROTONIX) 40 MG tablet [id number] No Take 1 tablet (40 mg total) by mouth once daily. Bensimhon, Shaune Pascal, MD Taking Active Self, Pharmacy Records  rosuvastatin (CRESTOR) 10 MG tablet [id number]  Take 1 tablet (10 mg total) by mouth daily. Bensimhon, Shaune Pascal, MD  Active   sacubitril-valsartan (ENTRESTO) 97-103 MG [id number] No Take 1 tablet by mouth 2 (two) times daily. Bensimhon, Shaune Pascal, MD Taking Active Self, Pharmacy Records  Semaglutide, 1 MG/DOSE, 4 MG/3ML SOPN [id number] No Inject 1 mg into the skin once a week. Bensimhon, Shaune Pascal, MD Taking Active   sertraline (ZOLOFT) 100 MG tablet [id number] No Take 1.5 tablets (150 mg total) by mouth daily. Bensimhon, Shaune Pascal, MD Taking Active Self, Pharmacy Records  spironolactone (ALDACTONE) 25 MG tablet [id number] No Take 1 tablet (25 mg total) by mouth daily. Bensimhon, Shaune Pascal, MD Taking Active   torsemide (DEMADEX) 20 MG tablet [id number] No Take 1 tablet (20 mg total) by mouth daily as needed (If wt gain > 3 lb in 24 hr or > 5 lb in 1 week).  Patient not taking: Reported on 10/19/2022   Fenton Foy, NP Not Taking Active Self, Pharmacy Records  traMADol HCl 100 MG TABS [id number] No Take 1 tablet (100 mg) by mouth every 6 (six) hours as needed for pain Bensimhon, Shaune Pascal, MD Taking Active   traZODone (DESYREL) 100 MG tablet [id number] No Take 1 tablet (100 mg total) by mouth at bedtime. Bensimhon, Shaune Pascal, MD Taking Active Self, Pharmacy  Records  warfarin (COUMADIN) 4 MG tablet [id number] No Take 2 tablet (8 mg) daily or as directed by HF clinic  Patient taking differently: Take 8 mg by mouth daily at 4 PM. Take 3 tablets (12 mg) on Tuesdays, and 2 tablet (8 mg) all other days, or as directed by HF clinic   Bensimhon, Shaune Pascal, MD Taking Active            Med Note Darcel Smalling, Nancee Liter   Fri Dec 11, 2022 11:56 AM)    Zinc Sulfate 220 (50 Zn) MG TABS [id number] No Take 1 tablet (220 mg total) by mouth daily. Bensimhon, Shaune Pascal, MD Taking Active             Patient Active Problem List   Diagnosis Date Noted   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/10/2022   Presence of left ventricular assist device (LVAD) 07/25/2021   Acute on chronic systolic CHF (congestive heart failure) 07/18/2021   Cardiogenic shock    Atrial fibrillation with RVR 04/30/2021   Acute hypoxemic respiratory failure    Right wrist pain 04/16/2021   Absolute anemia    Rectal bleeding    Iron deficiency 12/25/2020   Long term current use of anticoagulant - apixaban 12/25/2020   Paroxysmal atrial fibrillation    Lactic acidosis 11/22/2020   Hypotension 11/22/2020   Aspiration into airway    ICD (implantable cardioverter-defibrillator) battery depletion 05/15/2020   Endotracheal tube present    Respiratory failure    Acute encephalopathy    Acute pulmonary edema    NICM (nonischemic cardiomyopathy) 04/24/2020   S/P vasectomy 12/20/2019   Anxiety 12/20/2019   Hemoglobin A1C between 7% and 9% indicating borderline diabetic control 12/20/2019   Seasonal allergies 12/20/2019   Insomnia 12/20/2019   Vasectomy evaluation 06/20/2019   Visual problems 06/20/2019   Blurry vision, bilateral 06/20/2019   Hyperglycemia 02/13/2019   Hyperosmolar (nonketotic) coma 01/19/2019   AICD (automatic cardioverter/defibrillator) present 01/19/2019   Hyperlipidemia 12/07/2018   Follow-up exam 11/22/2018   Class 3 severe  obesity due to excess calories with serious comorbidity and body mass index (BMI) of 45.0 to 49.9 in adult 11/22/2018   Dizziness 11/22/2018   Lingular pneumonia 08/04/2018   Ventricular tachycardia 12/30/2017   PAF (paroxysmal atrial fibrillation)    Dilated cardiomyopathy    Pollen allergy 02/17/2017   Dyslipidemia 02/17/2017   Hematochezia 11/08/2015   CKD (chronic kidney disease), stage III ([place]) 06/20/2015   Cardiac defibrillator in situ    Chronic systolic CHF (congestive heart failure) 03/11/2015   Gouty arthritis 03/11/2015   Chest pain 12/28/2014   Acute renal failure superimposed on stage 3 chronic kidney disease 04/08/2014  Aspirin allergy 04/08/2014   Renal cell carcinoma    Gout attack 11/10/2011   Morbid obesity 10/23/2011   OSA (obstructive sleep apnea) 10/23/2011   Type 2 diabetes mellitus with stage 3 chronic kidney disease, with long-term current use of insulin 10/22/2011   Hyperlipidemia associated with type 2 diabetes mellitus 12/25/2010   Hypertension associated with diabetes 12/25/2010    Conditions to be addressed/monitored per PCP order:   community resources  Care Plan : General Plan of Care (Adult)  Updates made by Ethelda Chick since 01/05/2023 12:00 AM     Problem: Health Promotion or Disease Self-Management (General Plan of Care)   Priority: High  Onset Date: 01/29/2023  Note:   Current Barriers:  Chronic Disease Management support and education needs, S/P LVAD 07/21/21, DM, HTN, OSA, CHF, Afib, CKD, gout, DDD, HLD, depression,  h/o renal Ca 12/29/22:  Patient with no complaints today-concerned about losing Medicaid-has provided needed documentation and is waiting to hear.  Patient now doing dressing changes at home at LVAD site.  Does not check BP or BG.  Continues on Ozempic to lose weight.  Nurse Case Manager Clinical Goal(s):  Over the next 90 days, patient will attend all scheduled medical appointments: Over the next 30 days, patient will  have dental appt scheduled  Interventions:  Inter-disciplinary care team collaboration (see longitudinal plan of care) Collaborated with BSW  BSW referral for disability information-completed Evaluation of current treatment plan and patient's adherence to plan as established by provider. .  BSW completed a telephone outreach with patient, he stated he applied for disability over a year ago and has not heard anything back yet. Every time he calls he is on hold for over 2 hours and the phone hangs up. BSW informed patient it can take a long time to get a decision from disability. BSW provided patient with the telephone number for legal aid to assist with the process. Patient states he was able to be added to his wife's foodstamp case, no other resources are needed at this time. BSW completed a telephone outreach with patient. He stated he did receive a letter stating that his wife made too much money for him to have Medicaid, but she received a letter stating that she qualified for Medicaid. Patient stated that he was confused but did contact his worker. BSW encouraged patient to wait for his Medicaid worker to give him a call back. Patient understood. No resources are needed at this time. Reviewed medications with patient. Discussed plans with patient for ongoing care management follow up and provided patient with direct contact information for care management team Provided patient with educational materials related to exercise. Reviewed scheduled/upcoming provider appointments. Collaborated with Pharmacy Pharmacy referral for medication review 12/08/22:  returned patient's phone call   AFIB Interventions: (Status:  New goal.) Long Term Goal   Reviewed importance of adherence to anticoagulant exactly as prescribed Counseled on importance of regular laboratory monitoring as prescribed Afib action plan reviewed Assessed social determinant of health barriers  Heart Failure Interventions:  (Status:   New goal.) Long Term Goal Basic overview and discussion of pathophysiology of Heart Failure reviewed Discussed the importance of keeping all appointments with provider Assessed social determinant of health barriers    Chronic Kidney Disease Interventions:  (Status:  New goal.) Long Term Goal Evaluation of current treatment plan related to chronic kidney disease self management and patient's adherence to plan as established by provider      Reviewed prescribed diet  Reviewed medications with patient and discussed importance of compliance    Reviewed scheduled/upcoming provider appointments  Assessed social determinant of health barriers    Last practice recorded BP readings:  BP Readings from Last 3 Encounters:  12/11/22 (!) 130/0  10/19/22 (!) 92/0  09/16/22 103/76   Most recent eGFR/CrCl: No results found for: "EGFR"  No components found for: "CRCL"  Diabetes Interventions:  (Status:  New goal.) Long Term Goal Assessed patient's understanding of A1c goal: <7% Reviewed medications with patient and discussed importance of medication adherence Counseled on importance of regular laboratory monitoring as prescribed Discussed plans with patient for ongoing care management follow up and provided patient with direct contact information for care management team Reviewed scheduled/upcoming provider appointments  Advised patient, providing education and rationale, to check cbg as directed and record, calling provider  for findings outside established parameters Review of patient status, including review of consultants reports, relevant laboratory and other test results, and medications completed Assessed social determinant of health barriers Lab Results  Component Value Date   HGBA1C 5.3 09/16/2022  Hyperlipidemia Interventions:  (Status:  New goal.) Long Term Goal Medication review performed; medication list updated in electronic medical record.  Provider established cholesterol goals  reviewed Counseled on importance of regular laboratory monitoring as prescribed Reviewed importance of limiting foods high in cholesterol Assessed social determinant of health barriers   Hypertension Interventions:  (Status:  New goal.) Long Term Goal Last practice recorded BP readings:  BP Readings from Last 3 Encounters:  12/11/22 (!) 130/0  10/19/22 (!) 92/0  09/16/22 103/76   Most recent eGFR/CrCl: No results found for: "EGFR"  No components found for: "CRCL"  Evaluation of current treatment plan related to hypertension self management and patient's adherence to plan as established by provider Reviewed medications with patient and discussed importance of compliance Discussed plans with patient for ongoing care management follow up and provided patient with direct contact information for care management team Advised patient, providing education and rationale, to monitor blood pressure daily and record, calling PCP for findings outside established parameters Reviewed scheduled/upcoming provider appointments  Assessed social determinant of health barriers   Patient Goals/Self-Care Activities Over the next 90 days, patient will:  -Attends all scheduled provider appointments Calls provider office for new concerns or questionss  Follow Up Plan: The Managed Medicaid care management team will reach out to the patient again over the next 45 business  days.  The patient has been provided with contact information for the Managed Medicaid care management team and has been advised to call with any health related questions or concerns.         Follow up:  Patient agrees to Care Plan and Follow-up.  Plan: The  Patient has been provided with contact information for the Managed Medicaid care management team and has been advised to call with any health related questions or concerns.   Marga Melnick, [facility] Social Worker [phone]

## 2023-01-05 NOTE — Patient Instructions (Signed)
Visit Information  Jeffrey [name] was given information about Medicaid Managed Care team care coordination services as a part of their Healthy [facility] Medicaid benefit. Jeffrey [name] verbally consented to engagement with the [facility] Managed Care team.   If you are experiencing a medical emergency, please call 911 or report to your local emergency department or urgent care.   If you have a non-emergency medical problem during routine business hours, please contact your provider's office and ask to speak with a nurse.   For questions related to your Healthy [facility] health plan, please call: [phone] or visit the homepage here: [email or web address]  If you would like to schedule transportation through your Healthy [facility] plan, please call the following number at least 2 days in advance of your appointment: [phone]  For information about your ride after you set it up, call Ride Assist at [phone]. Use this number to activate a Will Call pickup, or if your transportation is late for a scheduled pickup. Use this number, too, if you need to make a change or cancel a previously scheduled reservation.  If you need transportation services right away, call [phone]. The after-hours call center is staffed 24 hours to handle ride assistance and urgent reservation requests (including discharges) 365 days a year. Urgent trips include sick visits, hospital discharge requests and life-sustaining treatment.  Call the [place] at [phone], at any time, 24 hours a day, 7 days a week. If you are in danger or need immediate medical attention call 911.  If you would like help to quit smoking, call 1-800-QUIT-NOW (762)614-2451) OR Espaol: 1-855-Djelo-Ya QO:409462) o para ms informacin haga clic aqu or Text READY to 200-400 to register via text  Jeffrey [name] - following are the goals we discussed in your visit today:   Goals  Addressed   None      The  Patient                                              has been provided with contact information for the Managed Medicaid care management team and has been advised to call with any health related questions or concerns.   Jeffrey [name], Jeffrey [name], MHA [facility]  Managed Medicaid Social Worker [phone]   Following is a copy of your plan of care:  Care Plan : General Plan of Care (Adult)  Updates made by Ethelda Chick since 01/05/2023 12:00 AM     Problem: Health Promotion or Disease Self-Management (General Plan of Care)   Priority: High  Onset Date: 01/29/2023  Note:   Current Barriers:  Chronic Disease Management support and education needs, S/P LVAD 07/21/21, DM, HTN, OSA, CHF, Afib, CKD, gout, DDD, HLD, depression,  h/o renal Ca 12/29/22:  Patient with no complaints today-concerned about losing Medicaid-has provided needed documentation and is waiting to hear.  Patient now doing dressing changes at home at LVAD site.  Does not check BP or BG.  Continues on Ozempic to lose weight.  Nurse Case Manager Clinical Goal(s):  Over the next 90 days, patient will attend all scheduled medical appointments: Over the next 30 days, patient will have dental appt scheduled  Interventions:  Inter-disciplinary care team collaboration (see longitudinal plan of care) Collaborated with BSW  BSW referral for disability information-completed Evaluation of current treatment plan and patient's adherence to plan as established  by provider. .  BSW completed a telephone outreach with patient, he stated he applied for disability over a year ago and has not heard anything back yet. Every time he calls he is on hold for over 2 hours and the phone hangs up. BSW informed patient it can take a long time to get a decision from disability. BSW provided patient with the telephone number for legal aid to assist with the process. Patient states he was able to be added to his wife's  foodstamp case, no other resources are needed at this time. BSW completed a telephone outreach with patient. He stated he did receive a letter stating that his wife made too much money for him to have Medicaid, but she received a letter stating that she qualified for Medicaid. Patient stated that he was confused but did contact his worker. BSW encouraged patient to wait for his Medicaid worker to give him a call back. Patient understood. No resources are needed at this time. Reviewed medications with patient. Discussed plans with patient for ongoing care management follow up and provided patient with direct contact information for care management team Provided patient with educational materials related to exercise. Reviewed scheduled/upcoming provider appointments. Collaborated with Pharmacy Pharmacy referral for medication review 12/08/22:  returned patient's phone call   AFIB Interventions: (Status:  New goal.) Long Term Goal   Reviewed importance of adherence to anticoagulant exactly as prescribed Counseled on importance of regular laboratory monitoring as prescribed Afib action plan reviewed Assessed social determinant of health barriers  Heart Failure Interventions:  (Status:  New goal.) Long Term Goal Basic overview and discussion of pathophysiology of Heart Failure reviewed Discussed the importance of keeping all appointments with provider Assessed social determinant of health barriers    Chronic Kidney Disease Interventions:  (Status:  New goal.) Long Term Goal Evaluation of current treatment plan related to chronic kidney disease self management and patient's adherence to plan as established by provider      Reviewed prescribed diet  Reviewed medications with patient and discussed importance of compliance    Reviewed scheduled/upcoming provider appointments  Assessed social determinant of health barriers    Last practice recorded BP readings:  BP Readings from Last 3 Encounters:   12/11/22 (!) 130/0  10/19/22 (!) 92/0  09/16/22 103/76   Most recent eGFR/CrCl: No results found for: "EGFR"  No components found for: "CRCL"  Diabetes Interventions:  (Status:  New goal.) Long Term Goal Assessed patient's understanding of A1c goal: <7% Reviewed medications with patient and discussed importance of medication adherence Counseled on importance of regular laboratory monitoring as prescribed Discussed plans with patient for ongoing care management follow up and provided patient with direct contact information for care management team Reviewed scheduled/upcoming provider appointments  Advised patient, providing education and rationale, to check cbg as directed and record, calling provider  for findings outside established parameters Review of patient status, including review of consultants reports, relevant laboratory and other test results, and medications completed Assessed social determinant of health barriers Lab Results  Component Value Date   HGBA1C 5.3 09/16/2022  Hyperlipidemia Interventions:  (Status:  New goal.) Long Term Goal Medication review performed; medication list updated in electronic medical record.  Provider established cholesterol goals reviewed Counseled on importance of regular laboratory monitoring as prescribed Reviewed importance of limiting foods high in cholesterol Assessed social determinant of health barriers   Hypertension Interventions:  (Status:  New goal.) Long Term Goal Last practice recorded BP readings:  BP Readings from  Last 3 Encounters:  12/11/22 (!) 130/0  10/19/22 (!) 92/0  09/16/22 103/76   Most recent eGFR/CrCl: No results found for: "EGFR"  No components found for: "CRCL"  Evaluation of current treatment plan related to hypertension self management and patient's adherence to plan as established by provider Reviewed medications with patient and discussed importance of compliance Discussed plans with patient for ongoing care  management follow up and provided patient with direct contact information for care management team Advised patient, providing education and rationale, to monitor blood pressure daily and record, calling PCP for findings outside established parameters Reviewed scheduled/upcoming provider appointments  Assessed social determinant of health barriers   Patient Goals/Self-Care Activities Over the next 90 days, patient will:  -Attends all scheduled provider appointments Calls provider office for new concerns or questionss  Follow Up Plan: The Managed Medicaid care management team will reach out to the patient again over the next 45 business  days.  The patient has been provided with contact information for the Managed Medicaid care management team and has been advised to call with any health related questions or concerns.

## 2023-01-08 ENCOUNTER — Other Ambulatory Visit: Payer: Self-pay

## 2023-01-13 ENCOUNTER — Ambulatory Visit: Payer: Self-pay | Admitting: Internal Medicine

## 2023-01-15 ENCOUNTER — Other Ambulatory Visit (HOSPITAL_COMMUNITY): Payer: Self-pay

## 2023-01-15 DIAGNOSIS — Z95811 Presence of heart assist device: Secondary | ICD-10-CM

## 2023-01-15 DIAGNOSIS — Z7901 Long term (current) use of anticoagulants: Secondary | ICD-10-CM

## 2023-01-18 ENCOUNTER — Ambulatory Visit: Payer: Self-pay | Admitting: Internal Medicine

## 2023-01-20 ENCOUNTER — Ambulatory Visit (INDEPENDENT_AMBULATORY_CARE_PROVIDER_SITE_OTHER): Payer: Medicaid Other | Admitting: Internal Medicine

## 2023-01-20 ENCOUNTER — Encounter: Payer: Self-pay | Admitting: Internal Medicine

## 2023-01-20 ENCOUNTER — Ambulatory Visit (HOSPITAL_COMMUNITY): Payer: Self-pay

## 2023-01-20 ENCOUNTER — Ambulatory Visit (HOSPITAL_COMMUNITY)
Admission: RE | Admit: 2023-01-20 | Discharge: 2023-01-20 | Disposition: A | Discharge status: Home / Self Care | Payer: Medicaid Other | Source: Ambulatory Visit | Attending: Internal Medicine | Admitting: Internal Medicine

## 2023-01-20 ENCOUNTER — Other Ambulatory Visit: Payer: Self-pay

## 2023-01-20 DIAGNOSIS — Z95811 Presence of heart assist device: Secondary | ICD-10-CM | POA: Insufficient documentation

## 2023-01-20 DIAGNOSIS — T827XXA Infection and inflammatory reaction due to other cardiac and vascular devices, implants and grafts, initial encounter: Secondary | ICD-10-CM

## 2023-01-20 DIAGNOSIS — Z7901 Long term (current) use of anticoagulants: Secondary | ICD-10-CM | POA: Diagnosis not present

## 2023-01-20 LAB — PROTIME-INR
INR: 1.4 — ABNORMAL HIGH (ref 0.8–1.2)
Prothrombin Time: 16.5 seconds — ABNORMAL HIGH (ref 11.4–15.2)

## 2023-01-20 MED ORDER — ENOXAPARIN SODIUM 60 MG/0.6ML IJ SOSY
60.0000 mg | PREFILLED_SYRINGE | Freq: Two times a day (BID) | INTRAMUSCULAR | 0 refills | Status: DC
  Filled 2023-01-20: qty 6, 5d supply, fill #0

## 2023-01-20 MED ORDER — CEFADROXIL 500 MG PO CAPS
500.0000 mg | ORAL_CAPSULE | Freq: Two times a day (BID) | ORAL | 3 refills | Status: DC
  Filled 2023-01-20: qty 60, 30d supply, fill #0
  Filled 2023-03-03 – 2023-03-10 (×2): qty 60, 30d supply, fill #1
  Filled 2023-04-23: qty 60, 30d supply, fill #2

## 2023-01-20 NOTE — Progress Notes (Signed)
Patient Active Problem List   Diagnosis Date Noted   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/10/2022   Presence of left ventricular assist device (LVAD) 07/25/2021   Acute on chronic systolic CHF (congestive heart failure) 07/18/2021   Cardiogenic shock    Atrial fibrillation with RVR 04/30/2021   Acute hypoxemic respiratory failure    Right wrist pain 04/16/2021   Absolute anemia    Rectal bleeding    Iron deficiency 12/25/2020   Long term current use of anticoagulant - apixaban 12/25/2020   Paroxysmal atrial fibrillation    Lactic acidosis 11/22/2020   Hypotension 11/22/2020   Aspiration into airway    ICD (implantable cardioverter-defibrillator) battery depletion 05/15/2020   Endotracheal tube present    Respiratory failure    Acute encephalopathy    Acute pulmonary edema    NICM (nonischemic cardiomyopathy) 04/24/2020   S/P vasectomy 12/20/2019   Anxiety 12/20/2019   Hemoglobin A1C between 7% and 9% indicating borderline diabetic control 12/20/2019   Seasonal allergies 12/20/2019   Insomnia 12/20/2019   Vasectomy evaluation 06/20/2019   Visual problems 06/20/2019   Blurry vision, bilateral 06/20/2019   Hyperglycemia 02/13/2019   Hyperosmolar (nonketotic) coma 01/19/2019   AICD (automatic cardioverter/defibrillator) present 01/19/2019   Hyperlipidemia 12/07/2018   Follow-up exam 11/22/2018   Class 3 severe obesity due to excess calories with serious comorbidity and body mass index (BMI) of 45.0 to 49.9 in adult 11/22/2018   Dizziness 11/22/2018   Lingular pneumonia 08/04/2018   Ventricular tachycardia 12/30/2017   PAF (paroxysmal atrial fibrillation)    Dilated cardiomyopathy    Pollen allergy 02/17/2017   Dyslipidemia 02/17/2017   Hematochezia 11/08/2015   CKD (chronic kidney disease), stage III (HCC) 06/20/2015   Cardiac defibrillator in situ    Chronic systolic CHF (congestive heart failure) 03/11/2015    Gouty arthritis 03/11/2015   Chest pain 12/28/2014   Acute renal failure superimposed on stage 3 chronic kidney disease 04/08/2014   Aspirin allergy 04/08/2014   Renal cell carcinoma    Gout attack 11/10/2011   Morbid obesity 10/23/2011   OSA (obstructive sleep apnea) 10/23/2011   Type 2 diabetes mellitus with stage 3 chronic kidney disease, with long-term current use of insulin 10/22/2011   Hyperlipidemia associated with type 2 diabetes mellitus 12/25/2010   Hypertension associated with diabetes 12/25/2010    Patient's Medications  New Prescriptions   No medications on file  Previous Medications   ACCU-CHEK FASTCLIX LANCETS MISC    USE AS DIRECTED FOUR TIMES DAILY   ACETAMINOPHEN (TYLENOL) 325 MG TABLET    Take 2 tablets (650 mg total) by mouth every 6 (six) hours as needed for fever.   ALLOPURINOL (ZYLOPRIM) 100 MG TABLET    Take 1 tablet (100 mg total) by mouth daily.   AMIODARONE (PACERONE) 200 MG TABLET    Take 1 tablet (200 mg total) by mouth daily.   AMLODIPINE (NORVASC) 10 MG TABLET    Take 1 tablet (10 mg total) by mouth daily   CEFADROXIL (DURICEF) 500 MG CAPSULE    Take 1 capsule (500 mg total) by mouth 2 (two) times daily.   COLCHICINE 0.6 MG TABLET    Take 1 tablet (0.6 mg total) by mouth once daily.   DICLOFENAC SODIUM (VOLTAREN) 1 % GEL    Apply 2 g topically 4 (four) times daily.   DOCUSATE SODIUM (COLACE) 100 MG CAPSULE    Take 1 capsule (100  mg total) by mouth 2 (two) times daily.   DOXAZOSIN (CARDURA) 1 MG TABLET    Take 2 tablets (2 mg total) by mouth daily.   GABAPENTIN (NEURONTIN) 400 MG CAPSULE    Take 1 capsule (400 mg total) by mouth 3 (three) times daily.   GLUCOSE BLOOD TEST STRIP    Use as directed 4 times daily.   HYDROCODONE-ACETAMINOPHEN (NORCO) 10-325 MG TABLET    Take 1 tablet by mouth every 6 (six) hours as needed for severe pain.   INSULIN PEN NEEDLE (TRUEPLUS 5-BEVEL PEN NEEDLES) 29G X 12.7MM MISC    Use as directed in the morning, at noon, and at  bedtime.   INSULIN SYRINGE-NEEDLE U-100 (ULTICARE INSULIN SYRINGE) 30G X 1/2" 1 ML MISC    USE TO INJECT INSULIN TWICE DAILY.   ISOSORBIDE-HYDRALAZINE (BIDIL) 20-37.5 MG TABLET    Take 2 tablets by mouth 3 (three) times daily.   MECLIZINE (ANTIVERT) 25 MG TABLET    Take 1 tablet (25 mg total) by mouth 3 (three) times daily as needed for dizziness.   METFORMIN (GLUCOPHAGE) 1000 MG TABLET    Take 1 tablet (1,000 mg total) by mouth 2 (two) times daily with a meal.   PANTOPRAZOLE (PROTONIX) 40 MG TABLET    Take 1 tablet (40 mg total) by mouth once daily.   ROSUVASTATIN (CRESTOR) 10 MG TABLET    Take 1 tablet (10 mg total) by mouth daily.   SACUBITRIL-VALSARTAN (ENTRESTO) 97-103 MG    Take 1 tablet by mouth 2 (two) times daily.   SEMAGLUTIDE, 2 MG/DOSE, (OZEMPIC, 2 MG/DOSE,) 8 MG/3ML SOPN    Inject 2 mg into the skin once a week.   SERTRALINE (ZOLOFT) 100 MG TABLET    Take 1.5 tablets (150 mg total) by mouth daily.   SPIRONOLACTONE (ALDACTONE) 25 MG TABLET    Take 1 tablet (25 mg total) by mouth daily.   TORSEMIDE (DEMADEX) 20 MG TABLET    Take 1 tablet (20 mg total) by mouth daily as needed (If wt gain > 3 lb in 24 hr or > 5 lb in 1 week).   TRAMADOL HCL 100 MG TABS    Take 1 tablet (100 mg) by mouth every 6 (six) hours as needed for pain   TRAZODONE (DESYREL) 100 MG TABLET    Take 1 tablet (100 mg total) by mouth at bedtime.   WARFARIN (COUMADIN) 4 MG TABLET    Take 2 tablet (8 mg) daily or as directed by HF clinic   ZINC SULFATE 220 (50 ZN) MG TABS    Take 1 tablet (220 mg total) by mouth daily.  Modified Medications   No medications on file  Discontinued Medications   No medications on file    Subjective: Jeffrey [name] is a 47 y.o. 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  On arrival he had a temp 100.3, WBC 11.9 K CT showed fluid around lungs suspicious for infection, started on vancomycin and cefepime.  Wound cultures from OSH grew MSSA.  During hospitalization he underwent I&D x 2 with wound VAC placement.  He was initially transition to cefadroxil for LVAD as a site infection.  Or cultures noted with no growth.  Patient discharged on  cefazolin x 6 weeks EOT 12/25 transition to cefadroxil 500 mg p.o. twice daily for likely lifelong suppression 08/31/22: Pt reports he feels well. No issues with tolerance. Wound vac in place. Denies missed doses.   10/08/22: Started cefadroxil a couple days ago. IV abx stopped on 1225, PICC out. Denies fevers and chills.   Today 01/20/23:Seen by CTS Dr. Gala Romney on 3/80/24 noted tthat pt on s presence of cefadroxil for chronic driveline infection.  Noted wound site healing well.  Site cleaned..  Patient not on Ozempic, referred to [facility] for transplant evaluation noted at last visit. He notes [facility] not [facility] "couple more pound to get down" Review of Systems: Review of Systems  All other systems reviewed and are negative.   Past Medical History:  Diagnosis Date   Anxiety    Aspirin allergy    Childhood asthma    Chronic systolic CHF (congestive heart failure)    a. EF 20-25% in 2012. b. EF 45-50% in 10/2011 with nonischemic nuc - presumed NICM. c. 12/2014 Echo: Sev depressed LV fxn, sev dil LV, mild LVH, mild MR, sev dil LA, mildly reduced RV fxn.   CKD (chronic kidney disease) stage 2, GFR 60-89 ml/min    H/O vasectomy 12/2019   High cholesterol    Hypertension    Morbid obesity    Nephrolithiasis    OSA on CPAP    Paroxysmal atrial fibrillation    Presumed NICM    a. 04/2014 Myoview: EF 26%, glob HK, sev glob HK, ? prior infarct;  b. Never cathed 2/2 CKD.   Renal cell carcinoma    a. s/p Rt robotic assisted partial converted to radical  nephrectomy on 01/2013.   Troponin level elevated    a. 04/2014, 12/2014: felt due to CHF.   Type II diabetes mellitus    Ventricular tachycardia    a. appropriate ICD therapy 12/2017    Social History   Tobacco Use   Smoking status: Former    Packs/day: 0.25    Years: 22.00    Additional pack years: 0.00    Total pack years: 5.50    Types: Cigarettes    Quit date: 04/30/2015    Years since quitting: 7.7   Smokeless tobacco: Never  Vaping Use   Vaping Use: Never used  Substance Use Topics   Alcohol use: Not Currently    Comment: none   Drug use: Yes    Types: Marijuana    Family History  Problem Relation Age of Onset   Hypertension Mother    Diabetes Maternal Aunt    Heart attack Neg Hx    Stroke Neg Hx    Colon cancer Neg Hx    Pancreatic cancer Neg Hx    Stomach cancer Neg Hx    Liver cancer Neg Hx    Esophageal cancer Neg Hx     Allergies  Allergen Reactions   Aspirin Shortness Of Breath, Itching and Rash     Burning sensation (Patient reports he tolerates other NSAIDS)    Bee Venom Hives and Swelling   Lisinopril Cough   Shrimp (Diagnostic) Rash    Health Maintenance  Topic Date Due   COVID-19 Vaccine (1) Never done   Diabetic kidney evaluation - Urine ACR  02/17/2018   FOOT EXAM  11/22/2019   OPHTHALMOLOGY EXAM  07/18/2020   HEMOGLOBIN A1C  12/16/2022   INFLUENZA VACCINE  05/06/2023   Diabetic kidney evaluation - eGFR measurement  12/11/2023   LIPID  PANEL  12/11/2023   DTaP/Tdap/Td (2 - Td or Tdap) 04/06/2025   COLONOSCOPY (Pts 45-29yrs Insurance coverage will need to be confirmed)  02/11/2031   Hepatitis C Screening  Completed   HIV Screening  Completed   HPV VACCINES  Aged Out    Objective:  Vitals:   01/20/23 1044  Temp: 97.6 F (36.4 C)  TempSrc: Oral  Weight: 284 lb (128.8 kg)   Body mass index is 44.48 kg/m.  Physical Exam Constitutional:      General: He is not in acute distress.    Appearance: He is normal weight. He is not  toxic-appearing.  HENT:     Head: Normocephalic and atraumatic.     Right Ear: External ear normal.     Left Ear: External ear normal.     Nose: No congestion or rhinorrhea.     Mouth/Throat:     Mouth: Mucous membranes are moist.     Pharynx: Oropharynx is clear.  Eyes:     Extraocular Movements: Extraocular movements intact.     Conjunctiva/sclera: Conjunctivae normal.     Pupils: Pupils are equal, round, and reactive to light.  Cardiovascular:     Rate and Rhythm: Normal rate and regular rhythm.     Heart sounds: No murmur heard.    No friction rub. No gallop.  Pulmonary:     Effort: Pulmonary effort is normal.     Breath sounds: Normal breath sounds.  Abdominal:     General: Abdomen is flat. Bowel sounds are normal.     Palpations: Abdomen is soft.  Musculoskeletal:        General: No swelling. Normal range of motion.     Cervical back: Normal range of motion and neck supple.  Skin:    General: Skin is warm and dry.  Neurological:     General: No focal deficit present.     Mental Status: He is oriented to person, place, and time.  Psychiatric:        Mood and Affect: Mood normal.     Lab Results Lab Results  Component Value Date   WBC 5.8 12/11/2022   HGB 13.1 12/11/2022   HCT 40.5 12/11/2022   MCV 85.8 12/11/2022   PLT 226 12/11/2022    Lab Results  Component Value Date   CREATININE 1.34 (H) 12/11/2022   BUN 20 12/11/2022   NA 139 12/11/2022   K 3.8 12/11/2022   CL 109 12/11/2022   CO2 23 12/11/2022    Lab Results  Component Value Date   ALT 12 10/08/2022   AST 19 10/08/2022   ALKPHOS 45 09/11/2022   BILITOT 0.3 10/08/2022    Lab Results  Component Value Date   CHOL 148 12/11/2022   HDL 44 12/11/2022   LDLCALC 83 12/11/2022   TRIG 103 12/11/2022   CHOLHDL 3.4 12/11/2022   No results found for: "LABRPR", "RPRTITER" No results found for: "HIV1RNAQUANT", "HIV1RNAVL", "CD4TABS"   Problem List Items Addressed This Visit    None  Assessment/Plan 47 YM with: #LVAD exit site infection #ICD #Hx of MSSA from LVAD exit site #NICM status post HM 3 VAD  placement on 07/21/2021 -Admitted with LVAD exit site infection.  Bedside wound cultures grew MSSA.  Patient started on ceftriaxone. CT showed fluid attenuation around DL in subq fat c/w drive line infection, edema in abdominal wall. Bedside wound Cx+ staph aureus.  Taken to the OR  on 11/8, 11/14 both with negative cultures for debridement with Dr.  Donata Clay.  Per OR note on 11/8 there was tunneled infection with excision of infected tissue including fascia.  Pocket of fluid was drained.  Per OR note on 11/14 there is debridement of tunnel, rectus sheath, no pocket of fluid noticed.  AFB and fungal cultures were added. - Completed cefazolin x 6 weeks EOT 12/25(PICC out). Transitioned to cefadroxil 500 mg p.o. twice daily for chronic suppression. Referred to transplant clinic(pt trying to loose weight)  Plan: -Continue cefadroxil suppression for now -labs today -F/U in 6 months       Danelle Earthly, MD [facility] 01/20/2023, 10:46 AM

## 2023-01-21 ENCOUNTER — Other Ambulatory Visit: Payer: Self-pay

## 2023-01-21 LAB — COMPLETE METABOLIC PANEL WITH GFR
AG Ratio: 1.4 (calc) (ref 1.0–2.5)
ALT: 24 U/L (ref 9–46)
AST: 24 U/L (ref 10–40)
Albumin: 4.1 g/dL (ref 3.6–5.1)
Alkaline phosphatase (APISO): 56 U/L (ref 36–130)
BUN/Creatinine Ratio: 18 (calc) (ref 6–22)
BUN: 25 mg/dL (ref 7–25)
CO2: 22 mmol/L (ref 20–32)
Calcium: 10.1 mg/dL (ref 8.6–10.3)
Chloride: 111 mmol/L — ABNORMAL HIGH (ref 98–110)
Creat: 1.36 mg/dL — ABNORMAL HIGH (ref 0.60–1.29)
Globulin: 3 g/dL (calc) (ref 1.9–3.7)
Glucose, Bld: 100 mg/dL — ABNORMAL HIGH (ref 65–99)
Potassium: 4.4 mmol/L (ref 3.5–5.3)
Sodium: 139 mmol/L (ref 135–146)
Total Bilirubin: 0.3 mg/dL (ref 0.2–1.2)
Total Protein: 7.1 g/dL (ref 6.1–8.1)
eGFR: 64 mL/min/{1.73_m2} (ref >=60)

## 2023-01-21 LAB — CBC WITH DIFFERENTIAL/PLATELET
Absolute Monocytes: 590 cells/uL (ref 200–950)
Basophils Absolute: 60 cells/uL (ref 0–200)
Basophils Relative: 0.9 %
Eosinophils Absolute: 302 cells/uL (ref 15–500)
Eosinophils Relative: 4.5 %
HCT: 43.4 % (ref 38.5–50.0)
Hemoglobin: 14 g/dL (ref 13.2–17.1)
Lymphs Abs: 1903 cells/uL (ref 850–3900)
MCH: 27.1 pg (ref 27.0–33.0)
MCHC: 32.3 g/dL (ref 32.0–36.0)
MCV: 84.1 fL (ref 80.0–100.0)
MPV: 13 fL — ABNORMAL HIGH (ref 7.5–12.5)
Monocytes Relative: 8.8 %
Neutro Abs: 3846 cells/uL (ref 1500–7800)
Neutrophils Relative %: 57.4 %
Platelets: 242 10*3/uL (ref 140–400)
RBC: 5.16 10*6/uL (ref 4.20–5.80)
RDW: 14.5 % (ref 11.0–15.0)
Total Lymphocyte: 28.4 %
WBC: 6.7 10*3/uL (ref 3.8–10.8)

## 2023-01-21 LAB — C-REACTIVE PROTEIN: CRP: <3 mg/L (ref <8.0)

## 2023-01-21 LAB — SEDIMENTATION RATE: Sed Rate: 2 mm/h (ref 0–15)

## 2023-01-22 ENCOUNTER — Other Ambulatory Visit (HOSPITAL_COMMUNITY): Payer: Self-pay | Admitting: Unknown Physician Specialty

## 2023-01-22 DIAGNOSIS — E1159 Type 2 diabetes mellitus with other circulatory complications: Secondary | ICD-10-CM

## 2023-01-22 DIAGNOSIS — Z7901 Long term (current) use of anticoagulants: Secondary | ICD-10-CM

## 2023-01-22 DIAGNOSIS — Z95811 Presence of heart assist device: Secondary | ICD-10-CM

## 2023-01-26 ENCOUNTER — Ambulatory Visit (HOSPITAL_COMMUNITY)
Admission: RE | Admit: 2023-01-26 | Discharge: 2023-01-26 | Disposition: A | Discharge status: Home / Self Care | Payer: Medicaid Other | Source: Ambulatory Visit | Attending: Cardiology | Admitting: Cardiology

## 2023-01-26 ENCOUNTER — Ambulatory Visit (HOSPITAL_COMMUNITY): Payer: Self-pay

## 2023-01-26 DIAGNOSIS — Z95811 Presence of heart assist device: Secondary | ICD-10-CM | POA: Insufficient documentation

## 2023-01-26 DIAGNOSIS — Z7901 Long term (current) use of anticoagulants: Secondary | ICD-10-CM | POA: Diagnosis not present

## 2023-01-26 LAB — PROTIME-INR
INR: 2.9 — ABNORMAL HIGH (ref 0.8–1.2)
Prothrombin Time: 30.2 seconds — ABNORMAL HIGH (ref 11.4–15.2)

## 2023-01-29 ENCOUNTER — Other Ambulatory Visit (HOSPITAL_COMMUNITY): Payer: Self-pay

## 2023-01-29 ENCOUNTER — Other Ambulatory Visit: Payer: Self-pay

## 2023-02-08 ENCOUNTER — Other Ambulatory Visit (HOSPITAL_COMMUNITY): Payer: Self-pay | Admitting: *Deleted

## 2023-02-08 ENCOUNTER — Encounter (HOSPITAL_COMMUNITY): Payer: Medicaid Other | Admitting: Internal Medicine

## 2023-02-08 DIAGNOSIS — Z79899 Other long term (current) drug therapy: Secondary | ICD-10-CM

## 2023-02-08 DIAGNOSIS — Z95811 Presence of heart assist device: Secondary | ICD-10-CM

## 2023-02-08 DIAGNOSIS — Z7901 Long term (current) use of anticoagulants: Secondary | ICD-10-CM

## 2023-02-09 ENCOUNTER — Encounter (HOSPITAL_COMMUNITY): Payer: Self-pay | Admitting: Internal Medicine

## 2023-02-09 ENCOUNTER — Other Ambulatory Visit: Payer: Self-pay

## 2023-02-09 ENCOUNTER — Ambulatory Visit (HOSPITAL_COMMUNITY): Payer: Self-pay | Admitting: Pharmacist

## 2023-02-09 ENCOUNTER — Ambulatory Visit (HOSPITAL_COMMUNITY)
Admission: RE | Admit: 2023-02-09 | Discharge: 2023-02-09 | Disposition: A | Discharge status: Home / Self Care | Payer: Medicaid Other | Source: Ambulatory Visit | Attending: Internal Medicine | Admitting: Internal Medicine

## 2023-02-09 VITALS — BP 142/95 | HR 72 | Wt 291.2 lb

## 2023-02-09 DIAGNOSIS — T827XXA Infection and inflammatory reaction due to other cardiac and vascular devices, implants and grafts, initial encounter: Secondary | ICD-10-CM

## 2023-02-09 DIAGNOSIS — E1122 Type 2 diabetes mellitus with diabetic chronic kidney disease: Secondary | ICD-10-CM | POA: Diagnosis present

## 2023-02-09 DIAGNOSIS — E1159 Type 2 diabetes mellitus with other circulatory complications: Secondary | ICD-10-CM

## 2023-02-09 DIAGNOSIS — G4733 Obstructive sleep apnea (adult) (pediatric): Secondary | ICD-10-CM | POA: Insufficient documentation

## 2023-02-09 DIAGNOSIS — Z85528 Personal history of other malignant neoplasm of kidney: Secondary | ICD-10-CM | POA: Diagnosis not present

## 2023-02-09 DIAGNOSIS — I428 Other cardiomyopathies: Secondary | ICD-10-CM | POA: Insufficient documentation

## 2023-02-09 DIAGNOSIS — I480 Paroxysmal atrial fibrillation: Secondary | ICD-10-CM | POA: Insufficient documentation

## 2023-02-09 DIAGNOSIS — Z79899 Other long term (current) drug therapy: Secondary | ICD-10-CM

## 2023-02-09 DIAGNOSIS — I5022 Chronic systolic (congestive) heart failure: Secondary | ICD-10-CM | POA: Diagnosis not present

## 2023-02-09 DIAGNOSIS — F32A Depression, unspecified: Secondary | ICD-10-CM | POA: Diagnosis not present

## 2023-02-09 DIAGNOSIS — N1832 Chronic kidney disease, stage 3b: Secondary | ICD-10-CM | POA: Diagnosis not present

## 2023-02-09 DIAGNOSIS — Z95811 Presence of heart assist device: Secondary | ICD-10-CM

## 2023-02-09 DIAGNOSIS — Z7901 Long term (current) use of anticoagulants: Secondary | ICD-10-CM | POA: Diagnosis not present

## 2023-02-09 DIAGNOSIS — Z01818 Encounter for other preprocedural examination: Secondary | ICD-10-CM | POA: Insufficient documentation

## 2023-02-09 DIAGNOSIS — R42 Dizziness and giddiness: Secondary | ICD-10-CM | POA: Insufficient documentation

## 2023-02-09 DIAGNOSIS — R002 Palpitations: Secondary | ICD-10-CM | POA: Diagnosis not present

## 2023-02-09 DIAGNOSIS — Z6841 Body Mass Index (BMI) 40.0 and over, adult: Secondary | ICD-10-CM | POA: Diagnosis not present

## 2023-02-09 DIAGNOSIS — M109 Gout, unspecified: Secondary | ICD-10-CM | POA: Diagnosis not present

## 2023-02-09 DIAGNOSIS — I471 Supraventricular tachycardia, unspecified: Secondary | ICD-10-CM | POA: Diagnosis not present

## 2023-02-09 DIAGNOSIS — Z905 Acquired absence of kidney: Secondary | ICD-10-CM | POA: Insufficient documentation

## 2023-02-09 DIAGNOSIS — I130 Hypertensive heart and chronic kidney disease with heart failure and stage 1 through stage 4 chronic kidney disease, or unspecified chronic kidney disease: Secondary | ICD-10-CM | POA: Insufficient documentation

## 2023-02-09 DIAGNOSIS — F419 Anxiety disorder, unspecified: Secondary | ICD-10-CM | POA: Diagnosis not present

## 2023-02-09 DIAGNOSIS — I152 Hypertension secondary to endocrine disorders: Secondary | ICD-10-CM

## 2023-02-09 LAB — COMPREHENSIVE METABOLIC PANEL
ALT: 25 U/L (ref 0–44)
AST: 27 U/L (ref 15–41)
Albumin: 3.5 g/dL (ref 3.5–5.0)
Alkaline Phosphatase: 58 U/L (ref 38–126)
Anion gap: 8 (ref 5–15)
BUN: 18 mg/dL (ref 6–20)
CO2: 21 mmol/L — ABNORMAL LOW (ref 22–32)
Calcium: 9 mg/dL (ref 8.9–10.3)
Chloride: 106 mmol/L (ref 98–111)
Creatinine, Ser: 1.39 mg/dL — ABNORMAL HIGH (ref 0.61–1.24)
GFR, Estimated: >60 mL/min (ref >60)
Glucose, Bld: 154 mg/dL — ABNORMAL HIGH (ref 70–99)
Potassium: 4 mmol/L (ref 3.5–5.1)
Sodium: 135 mmol/L (ref 135–145)
Total Bilirubin: 0.5 mg/dL (ref 0.3–1.2)
Total Protein: 7 g/dL (ref 6.5–8.1)

## 2023-02-09 LAB — CBC
HCT: 41.5 % (ref 39.0–52.0)
Hemoglobin: 13.7 g/dL (ref 13.0–17.0)
MCH: 26.8 pg (ref 26.0–34.0)
MCHC: 33 g/dL (ref 30.0–36.0)
MCV: 81.1 fL (ref 80.0–100.0)
Platelets: 238 10*3/uL (ref 150–400)
RBC: 5.12 MIL/uL (ref 4.22–5.81)
RDW: 15.9 % — ABNORMAL HIGH (ref 11.5–15.5)
WBC: 7 10*3/uL (ref 4.0–10.5)
nRBC: 0 % (ref 0.0–0.2)

## 2023-02-09 LAB — TSH: TSH: 3.519 u[IU]/mL (ref 0.350–4.500)

## 2023-02-09 LAB — PREALBUMIN: Prealbumin: 28 mg/dL (ref 18–38)

## 2023-02-09 LAB — T4, FREE: Free T4: 0.83 ng/dL (ref 0.61–1.12)

## 2023-02-09 LAB — LACTATE DEHYDROGENASE: LDH: 186 U/L (ref 98–192)

## 2023-02-09 LAB — PROTIME-INR
INR: 4 — ABNORMAL HIGH (ref 0.8–1.2)
Prothrombin Time: 38.9 seconds — ABNORMAL HIGH (ref 11.4–15.2)

## 2023-02-09 MED ORDER — DOXAZOSIN MESYLATE 2 MG PO TABS
2.0000 mg | ORAL_TABLET | Freq: Two times a day (BID) | ORAL | 3 refills | Status: DC
  Filled 2023-02-09 – 2023-02-23 (×2): qty 60, 30d supply, fill #0
  Filled 2023-03-10 – 2023-04-28 (×3): qty 60, 30d supply, fill #1
  Filled 2023-07-02: qty 60, 30d supply, fill #2
  Filled 2023-08-03 – 2023-08-04 (×3): qty 60, 30d supply, fill #3

## 2023-02-09 NOTE — Progress Notes (Signed)
Patient requested assistance with navigating his insurance. Patient reports that he was recently told that his Medicaid has been terminated. CSW checked eligibility and confirmed patient does not have active medicaid at this time. CSW contacted Delena Bali at [facility] [phone] and left message for return call. CSW has asked patient to collect all paperwork at home and return to clinic to meet with CSW to review and attempt to resolve his Medicaid issue. [name], LCSW, CCSW-MCS [phone]

## 2023-02-09 NOTE — Progress Notes (Signed)
Patient presents for 2 mo f/u with 1.5 year Intermacs in VAD Clinic today alone. Reports no problem with VAD or equipment.  Pt states he is overall doing well. Denies lightheadedness, dizziness, falls, shortness of breath, heart failure symptoms and signs of bleeding.   BP elevated today (see vitals below). Per Dr Gala Romney will increase Cardura to 2 mg BID. Updated prescription sent to pt's pharmacy. He verbalized understanding of medication change. Will recheck BP in 1 week.   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 Pt followed closely in [facility] Clinic for weekly dressing changes.   Weight up 1 lb today. He is exercising at the gym 2 days a week. He states he is motivated to get his health back on track. He has not taken Ozempic for the last month due to insurance difficulties.   Jeffrey [name] CSW in to discuss disability and Medicaid questions/issues. See her note for details.   Drive line dressing change completed today. See below for full documentation. Continue weekly dressing changes with VASHE solution and daily kit.   Vital Signs:  Doppler Pressure: 142 Automatc BP: 142/95 (120) HR: 72 SPO2: 95% on RA   Weight: 291.2 lb w/ eqt Last weight:  290.2 lbs w/eqt   VAD Indication: Destination Therapy due to smoking and BMI   VAD interrogation & Equipment Management: Speed: 6200 Flow: 4.9 Power: 5.2 w    PI: 3.1 Hct: 38   Alarms: none Events: 10 - 20 daily  Fixed speed 6200 Low speed limit: 5900   Primary Controller:  Replace back up battery in 11 months. Back up controller:   Replace back up battery in 21 months.--not present today   Annual Equipment Maintenance on UBC/PM was performed on 10/19/22.    I reviewed the LVAD parameters from today and compared the results to the patient's prior recorded data. LVAD interrogation was NEGATIVE for significant power changes, NEGATIVE for clinical alarms and STABLE for PI  events/speed drops. No programming changes were made and pump is functioning within specified parameters. Pt is performing daily controller and system monitor self tests along with completing weekly and monthly maintenance for LVAD equipment.   LVAD equipment check completed and is in good working order. Back-up equipment present.    Exit Site Care: Drive line being maintained weekly by pt's wife. Existing dressing removed using sterile technique. Wound bed cleansed with VASHE solution and allowed to dry. Skin surrounding site cleansed with CHG swab x 2 and allowed to dry. Exit site healed and incorporated, the velour is exposed 5 cm at exit site. Wound does not tunnel. No drainage, tenderness or rash. VASHE soaked 2 x 2 wrapped around drive line at exit site and covered with 4 x 4 gauze. Anchor reapplied x 2. Pt denies fever or chills. Continue weekly dressing changes, cleansing with VASHE solution, with VASHE moistened 2 x 2 at exit site. Provided pt with 7 daily kits, 14 anchors, for home use. States he has enough VASHE solution at home.    Device:  Left subcutaneous dual ICD Therapies: on at 250 BPM Last check: 03/04/22  BP & Labs:  Doppler 142- Doppler is reflecting MAP   Hgb 13.7 - No S/S of bleeding. Specifically denies melena/BRBPR or nosebleeds.   LDH stable at 186 with established baseline of 160 - 300. Denies tea-colored urine. No power elevations noted on interrogation.   1.5 year  Intermacs follow up completed including:  Quality of Life, KCCQ-12,  and Neurocognitive trail making.   Pt completed 1225 feet during 6 minute walk.  Back up controller: Not present  Patient Goals: To get back on track with his weight loss journey.   Jeffrey [name]     02/09/2023    2:38 PM 08/05/2022   11:46 AM 01/28/2022   10:41 AM  KCCQ-12  1 a. Ability to shower/bathe Slightly limited Moderately limited Not at all limited  1 b. Ability to walk 1 block Slightly  limited Moderately limited Slightly limited  1 c. Ability to hurry/jog Extremely limited Extremely limited Quite a bit limited  2. Edema feet/ankles/legs Less than once a week 1-2 times a week Never over the past 2 weeks  3. Limited by fatigue At least once a day 3+ times per week, not every day 1-2 times a week  4. Limited by dyspnea At least once a day 3+ times a week, not every day 1-2 times a week  5. Sitting up / on 3+ pillows Never over the past 2 weeks Never over the past 2 weeks Never over the past 2 weeks  6. Limited enjoyment of life Slightly limited Moderately limited Slightly limited  7. Rest of life w/ symptoms Mostly satisfied Somewhat satisfied Somewhat satisfied  8 a. Participation in hobbies Limited quite a bit Moderately limited Limited quite a bit  8 b. Participation in chores Limited quite a bit Limited quite a bit Slightly limited  8 c. Visiting family/friends Limited quite a bit Limited quite a bit Slightly limited     Plan:  Increase Cardura to 2 mg twice a day. Your current tablets are 1 mg tablets- take 2 tablets in the morning, and 2 tablets in the evening. The updated prescription I sent in is for 2 mg tablets. When you pick up the new prescription please take 1 tablet in the morning, and 1 tablet in the evening Coumadin dosing per Lauren PharmD Return to clinic in 1 week for BP check Return in 2 months for follow up with Dr Gala Romney Gather insurance/disability paperwork for Jeffrey Cruz RN VAD Coordinator  Office: [phone]  24/7 Pager: [phone]

## 2023-02-09 NOTE — Patient Instructions (Addendum)
Increase Cardura to 2 mg twice a day. Your current tablets are 1 mg tablets- take 2 tablets in the morning, and 2 tablets in the evening. The updated prescription I sent in is for 2 mg tablets. When you pick up the new prescription please take 1 tablet in the morning, and 1 tablet in the evening Coumadin dosing per Lauren PharmD Return to clinic in 1 week for BP check Return in 2 months for follow up with Dr Gala Romney Gather insurance/disability paperwork for Annice Pih

## 2023-02-09 NOTE — Progress Notes (Addendum)
VAD CLINIC Follow-up Visit  PCP: Ivonne Andrew, NP HF MD: DB   HPI:  Jeffrey [name] is a 47 yo male with a history of  poorly controlled HTN, R renal cell carcinoma s/p nephrectomy, CKD IIIa, DM2, OSA, gout, morbid obesity and systolic HF due to NICM. 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 D/c'd home with plan for 6 weeks IV Ancef with plan to transition to cefadroxil for long-term suppression on 09/28/22.   Here for f/u. On cefadroxil for chronic DL infection. Wound site healing well. Being followed by Dr. Donata Clay. Site cleaned today. Denies orthopnea or PND. No fevers, chills or problems with driveline. No bleeding, melena or neuro symptoms. No VAD alarms. Taking all meds as prescribed.     VAD Indication: Destination Therapy due to smoking and BMI   VAD interrogation & Equipment Management: Speed: 6200 Flow: 4.9 Power: 5.2 w    PI: 3.1 Hct: 38   Alarms: none Events: 10 - 20 daily  Fixed speed 6200 Low speed limit: 5900   Primary Controller:  Replace back up battery in 11 months. Back up controller:   Replace back up battery in 21 months.--not present today   Annual Equipment Maintenance on UBC/PM was performed on 10/19/22.    I reviewed the LVAD parameters from today and compared the results to the patient's prior recorded data. LVAD interrogation was NEGATIVE for significant power changes, NEGATIVE for clinical alarms and POSITIVE for PI events/speed drops. No programming changes were made and pump is functioning within specified parameters. Pt is performing daily controller and system monitor self tests along with completing weekly and monthly maintenance for LVAD equipment.   LVAD equipment check completed and is in good working order. Back-up equipment present.   Follow up for Heart Failure/LVAD:  Here for routine VAD f/u. Continues on IV abx. Doing well. No fevers or chills.  Wound vac working well. Stil with some pain at wound vac site. No drainage. Has intermittent dizziness and palpitations. Denies orthopnea or PND.  No bleeding, melena or neuro symptoms. No VAD alarms. Taking all meds as prescribed.      Past Medical History:  Diagnosis Date   Anxiety    Aspirin allergy    Childhood asthma    Chronic systolic CHF (congestive heart failure) (HCC)    a. EF 20-25% in 2012. b. EF 45-50% in 10/2011 with nonischemic nuc - presumed NICM. c. 12/2014 Echo: Sev depressed LV fxn, sev dil LV, mild LVH, mild MR, sev dil LA, mildly reduced RV fxn.   CKD (chronic kidney disease) stage 2, GFR 60-89 ml/min    H/O vasectomy 12/2019   High cholesterol    Hypertension    Morbid obesity (HCC)    Nephrolithiasis    OSA on CPAP    Paroxysmal atrial fibrillation (HCC)    Presumed NICM    a. 04/2014 Myoview: EF 26%, glob HK, sev glob HK, ? prior infarct;  b. Never cathed 2/2 CKD.   Renal cell carcinoma (HCC)    a. s/p Rt robotic assisted partial converted to radical nephrectomy on 01/2013.   Troponin level elevated    a. 04/2014, 12/2014: felt due to CHF.   Type II diabetes mellitus (HCC)    Ventricular tachycardia (HCC)    a. appropriate ICD therapy 12/2017    Current Outpatient Medications  Medication Sig Dispense Refill   Accu-Chek  FastClix Lancets MISC USE AS DIRECTED FOUR TIMES DAILY 102 each 11   acetaminophen (TYLENOL) 325 MG tablet Take 2 tablets (650 mg total) by mouth every 6 (six) hours as needed for fever.     allopurinol (ZYLOPRIM) 100 MG tablet Take 1 tablet (100 mg total) by mouth daily. 90 tablet 3   amiodarone (PACERONE) 200 MG tablet Take 1 tablet (200 mg total) by mouth daily. 30 tablet 6   amLODipine (NORVASC) 10 MG tablet Take 1 tablet (10 mg total) by mouth daily 30 tablet 6   cefadroxil (DURICEF) 500 MG capsule Take 1 capsule (500 mg total) by mouth 2 (two) times daily. 180 capsule 3   colchicine 0.6 MG tablet Take 1 tablet (0.6 mg total) by mouth once  daily. 30 tablet 6   diclofenac Sodium (VOLTAREN) 1 % GEL Apply 2 g topically 4 (four) times daily. (Patient taking differently: Apply 2 g topically 4 (four) times daily as needed (pain).) 100 g 1   doxazosin (CARDURA) 1 MG tablet Take 2 tablets (2 mg total) by mouth daily. 60 tablet 3   gabapentin (NEURONTIN) 400 MG capsule Take 1 capsule (400 mg total) by mouth 3 (three) times daily. 30 capsule 6   glucose blood test strip Use as directed 4 times daily. 100 each 11   Insulin Pen Needle (TRUEPLUS 5-BEVEL PEN NEEDLES) 29G X 12.7MM MISC Use as directed in the morning, at noon, and at bedtime. 100 each 3   Insulin Syringe-Needle U-100 (ULTICARE INSULIN SYRINGE) 30G X 1/2" 1 ML MISC USE TO INJECT INSULIN TWICE DAILY. 100 each 11   isosorbide-hydrALAZINE (BIDIL) 20-37.5 MG tablet Take 2 tablets by mouth 3 (three) times daily. 180 tablet 6   meclizine (ANTIVERT) 25 MG tablet Take 1 tablet (25 mg total) by mouth 3 (three) times daily as needed for dizziness. 90 tablet 2   metFORMIN (GLUCOPHAGE) 1000 MG tablet Take 1 tablet (1,000 mg total) by mouth 2 (two) times daily with a meal. 60 tablet 11   pantoprazole (PROTONIX) 40 MG tablet Take 1 tablet (40 mg total) by mouth once daily. 90 tablet 3   rosuvastatin (CRESTOR) 10 MG tablet Take 1 tablet (10 mg total) by mouth daily. 90 tablet 3   sacubitril-valsartan (ENTRESTO) 97-103 MG Take 1 tablet by mouth 2 (two) times daily. 60 tablet 6   sertraline (ZOLOFT) 100 MG tablet Take 1.5 tablets (150 mg total) by mouth daily. 45 tablet 6   spironolactone (ALDACTONE) 25 MG tablet Take 1 tablet (25 mg total) by mouth daily. 30 tablet 6   traMADol HCl 100 MG TABS Take 1 tablet (100 mg) by mouth every 6 (six) hours as needed for pain 90 tablet 0   traZODone (DESYREL) 100 MG tablet Take 1 tablet (100 mg total) by mouth at bedtime. 30 tablet 11   warfarin (COUMADIN) 4 MG tablet Take 2 tablet (8 mg) daily or as directed by HF clinic (Patient taking differently: Take 8 mg by  mouth daily at 4 PM. Take 3 tablets (12 mg) on Tuesdays, and 2 tablet (8 mg) all other days, or as directed by HF clinic) 90 tablet 11   Zinc Sulfate 220 (50 Zn) MG TABS Take 1 tablet (220 mg total) by mouth daily. 30 tablet 6   docusate sodium (COLACE) 100 MG capsule Take 1 capsule (100 mg total) by mouth 2 (two) times daily. (Patient not taking: Reported on 01/20/2023) 10 capsule 0   enoxaparin (LOVENOX) 60 MG/0.6ML injection Inject 0.6  mLs (60 mg total) into the skin every 12 (twelve) hours. (Patient not taking: Reported on 02/09/2023) 6 mL 0   HYDROcodone-acetaminophen (NORCO) 10-325 MG tablet Take 1 tablet by mouth every 6 (six) hours as needed for severe pain. (Patient not taking: Reported on 02/09/2023) 20 tablet 0   Semaglutide, 2 MG/DOSE, (OZEMPIC, 2 MG/DOSE,) 8 MG/3ML SOPN Inject 2 mg into the skin once a week. (Patient not taking: Reported on 02/09/2023) 3 mL 11   torsemide (DEMADEX) 20 MG tablet Take 1 tablet (20 mg total) by mouth daily as needed (If wt gain > 3 lb in 24 hr or > 5 lb in 1 week). (Patient not taking: Reported on 02/09/2023) 30 tablet 2   No current facility-administered medications for this encounter.    Aspirin, Bee venom, Lisinopril, and Shrimp (diagnostic)  REVIEW OF SYSTEMS: All systems negative except as listed in HPI, PMH and Problem list.   Vitals:   02/09/23 1107 02/09/23 1116  BP: (!) 142/0 (!) 142/95  Pulse: 72   SpO2: 95%   Weight: 132.1 kg (291 lb 3.2 oz)      Vital Signs:  Doppler Pressure: 142 Automatc BP: 142/95 (120) HR: 72 SPO2: 95% on RA   Weight: 291.2 lb w/ eqt Last weight:  290.2 lbs w/eqt   Exam: General:  Well appearing. No resp difficulty HEENT: normal Neck: supple. no JVD. Carotids 2+ bilat; no bruits. No lymphadenopathy or thryomegaly appreciated. Cor: PMI nondisplaced. Regular rate & rhythm. No rubs, gallops or murmurs. Lungs: clear Abdomen: obese soft, nontender, nondistended. No hepatosplenomegaly. No bruits or masses. Good  bowel sounds. Extremities: no cyanosis, clubbing, rash, edema Neuro: alert & orientedx3, cranial nerves grossly intact. moves all 4 extremities w/o difficulty. Affect pleasant   ASSESSMENT AND PLAN:  .1. Chronic systolic CHF - Nonischemic CM, end-stage - EF 2012; 20-25%  - Echo  8/22: EF < 20%, severe LV dilation, moderate RV dysfunction.   - RHC (8/22): well compensated hemodynamics. - s/p S-ICD - S/p HM3 LVAD 10/17 (DT) - Doing very well - NYHA I  - Continue Bidil 2 tabs tid - Continue spironolactone 25 mg daily  - Continue entresto to 97/130 bid - BP up. Increase doxazosin - Again stressed need to continue weight loss efforts (see below). Resume Ozempic. Once BMI closer to 35 (and DL infection cleared) will refer to [facility] for begin transplant evaluation   2. VAD  - s/p HM-3 placement 07/21/21  - VAD interrogated personally. Parameters stable. - INR 4.0 Goal INR 2.0-3.0 Discussed dosing with PharmD personally. - LDH 154 - DL site much improved. Continue VASHE - Hgb 13.7  3. DL site infection - Much improved. - Continue VASHE - Continue cefadroxil for long-term suppression  4 Atrial fibrillation: Paroxysmal - Had severe AF with RVR 7/22 s/p DC-CV x 2 - Seen by EP. Not a candidate for ablation due to body habitus  (could consider AVN ablation and CRT, if needed)  - Had post-op AF with RVR post VAD - 10/23 S/P DC-CV with restoration of NSR. - Remains in NSR. Continue amio 100 daily. No change - On warfarin. INR management as above  5. HTN - BP up. Increase doxazosin  6. CKD stage 3B  - Has solitary kidney.   - Baseline creatinine is 1.5-1.8.  - Creatinine 1.39 today   7. OSA - Continue CPAP   8. DM2  - On Jardiance. - Hgb A1c 9.4. -> 5.9 - PCP following - Continue Crestor 10  9.  Polyarticular gout. - quiescent  10. Depression/anxiety - Continue Zoloft 150mg  daily - Much improved  11. Morbid obesity - Body mass index is 45.61 kg/m. - Resume  GLP1RA  12.  Palpitations/dizziness - Zio 12/23 - 77 runs SVT. Continue amio. PVCs 1.9% PACs 4.7% - resolved  Total time spent 35 minutes. Over half that time spent discussing above.   Arvilla Meres, MD  11:36 AM

## 2023-02-10 ENCOUNTER — Telehealth (HOSPITAL_COMMUNITY): Payer: Self-pay | Admitting: Licensed Clinical Social Worker

## 2023-02-10 NOTE — Telephone Encounter (Signed)
Patient called to cancel appointment with CSW this morning to review paperwork regarding termination of Medicaid. CSW confirmed via [employer] [employer] that patient's Medicaid has been terminated and unclear why at this time. Patient states he had a family emergency and unable to meet with CSW at this time. [name], LCSW, CCSW-MCS [phone]

## 2023-02-11 LAB — T3, FREE: T3, Free: 2.9 pg/mL (ref 2.0–4.4)

## 2023-02-12 ENCOUNTER — Other Ambulatory Visit (HOSPITAL_COMMUNITY): Payer: Self-pay | Admitting: *Deleted

## 2023-02-12 DIAGNOSIS — Z95811 Presence of heart assist device: Secondary | ICD-10-CM

## 2023-02-12 DIAGNOSIS — Z7901 Long term (current) use of anticoagulants: Secondary | ICD-10-CM

## 2023-02-14 NOTE — Progress Notes (Signed)
VAD CLINIC Follow-up Visit  PCP: Ivonne Andrew, NP HF MD: DB   HPI:  Jeffrey [name] is a 47 yo male with a history of  poorly controlled HTN, R renal cell carcinoma s/p nephrectomy, CKD IIIa, DM2, OSA, gout, morbid obesity and systolic HF due to NICM. 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 D/c'd home with plan for 6 weeks IV Ancef with plan to transition to cefadroxil for long-term suppression on 09/28/22.   Here for f/u. On cefadroxil for chronic DL infection. Wound site improving with VASHE. Doing well. Going to gym 2x/week. Feels motivated to get back on track and lose weight for transplant. However, not on ozempic due to problems with insurance. Denies orthopnea or PND. No fevers, chills or problems with driveline. No bleeding, melena or neuro symptoms. No VAD alarms. Taking all meds as prescribed.   VAD Indication: Destination Therapy due to smoking and BMI   VAD interrogation & Equipment Management: Speed: 6200 Flow: 4.9 Power: 5.2 w    PI: 3.1 Hct: 38   Alarms: none Events: 10 - 20 daily  Fixed speed 6200 Low speed limit: 5900   Primary Controller:  Replace back up battery in 11 months. Back up controller:   Replace back up battery in 21 months.--not present today   Annual Equipment Maintenance on UBC/PM was performed on 10/19/22.    I reviewed the LVAD parameters from today and compared the results to the patient's prior recorded data. LVAD interrogation was NEGATIVE for significant power changes, NEGATIVE for clinical alarms and STABLE for PI events/speed drops. No programming changes were made and pump is functioning within specified parameters. Pt is performing daily controller and system monitor self tests along with completing weekly and monthly maintenance for LVAD equipment.   LVAD equipment check completed and is in good working order. Back-up equipment present.   Follow  up for Heart Failure/LVAD:  Here for routine VAD f/u. Continues on IV abx. Doing well. No fevers or chills. Wound vac working well. Stil with some pain at wound vac site. No drainage. Has intermittent dizziness and palpitations. Denies orthopnea or PND.  No bleeding, melena or neuro symptoms. No VAD alarms. Taking all meds as prescribed.      Past Medical History:  Diagnosis Date   Anxiety    Aspirin allergy    Childhood asthma    Chronic systolic CHF (congestive heart failure) (HCC)    a. EF 20-25% in 2012. b. EF 45-50% in 10/2011 with nonischemic nuc - presumed NICM. c. 12/2014 Echo: Sev depressed LV fxn, sev dil LV, mild LVH, mild MR, sev dil LA, mildly reduced RV fxn.   CKD (chronic kidney disease) stage 2, GFR 60-89 ml/min    H/O vasectomy 12/2019   High cholesterol    Hypertension    Morbid obesity (HCC)    Nephrolithiasis    OSA on CPAP    Paroxysmal atrial fibrillation (HCC)    Presumed NICM    a. 04/2014 Myoview: EF 26%, glob HK, sev glob HK, ? prior infarct;  b. Never cathed 2/2 CKD.   Renal cell carcinoma (HCC)    a. s/p Rt robotic assisted partial converted to radical nephrectomy on 01/2013.   Troponin level elevated    a. 04/2014, 12/2014: felt due to CHF.   Type II diabetes mellitus (HCC)    Ventricular tachycardia (HCC)    a. appropriate  ICD therapy 12/2017    Current Outpatient Medications  Medication Sig Dispense Refill   Accu-Chek FastClix Lancets MISC USE AS DIRECTED FOUR TIMES DAILY 102 each 11   acetaminophen (TYLENOL) 325 MG tablet Take 2 tablets (650 mg total) by mouth every 6 (six) hours as needed for fever.     allopurinol (ZYLOPRIM) 100 MG tablet Take 1 tablet (100 mg total) by mouth daily. 90 tablet 3   amiodarone (PACERONE) 200 MG tablet Take 1 tablet (200 mg total) by mouth daily. 30 tablet 6   amLODipine (NORVASC) 10 MG tablet Take 1 tablet (10 mg total) by mouth daily 30 tablet 6   cefadroxil (DURICEF) 500 MG capsule Take 1 capsule (500 mg total) by  mouth 2 (two) times daily. 180 capsule 3   colchicine 0.6 MG tablet Take 1 tablet (0.6 mg total) by mouth once daily. 30 tablet 6   diclofenac Sodium (VOLTAREN) 1 % GEL Apply 2 g topically 4 (four) times daily. (Patient taking differently: Apply 2 g topically 4 (four) times daily as needed (pain).) 100 g 1   doxazosin (CARDURA) 1 MG tablet Take 2 tablets (2 mg total) by mouth daily. 60 tablet 3   doxazosin (CARDURA) 2 MG tablet Take 1 tablet (2 mg total) by mouth 2 (two) times daily. 180 tablet 3   gabapentin (NEURONTIN) 400 MG capsule Take 1 capsule (400 mg total) by mouth 3 (three) times daily. 30 capsule 6   glucose blood test strip Use as directed 4 times daily. 100 each 11   Insulin Pen Needle (TRUEPLUS 5-BEVEL PEN NEEDLES) 29G X 12.7MM MISC Use as directed in the morning, at noon, and at bedtime. 100 each 3   Insulin Syringe-Needle U-100 (ULTICARE INSULIN SYRINGE) 30G X 1/2" 1 ML MISC USE TO INJECT INSULIN TWICE DAILY. 100 each 11   isosorbide-hydrALAZINE (BIDIL) 20-37.5 MG tablet Take 2 tablets by mouth 3 (three) times daily. 180 tablet 6   meclizine (ANTIVERT) 25 MG tablet Take 1 tablet (25 mg total) by mouth 3 (three) times daily as needed for dizziness. 90 tablet 2   metFORMIN (GLUCOPHAGE) 1000 MG tablet Take 1 tablet (1,000 mg total) by mouth 2 (two) times daily with a meal. 60 tablet 11   pantoprazole (PROTONIX) 40 MG tablet Take 1 tablet (40 mg total) by mouth once daily. 90 tablet 3   rosuvastatin (CRESTOR) 10 MG tablet Take 1 tablet (10 mg total) by mouth daily. 90 tablet 3   sacubitril-valsartan (ENTRESTO) 97-103 MG Take 1 tablet by mouth 2 (two) times daily. 60 tablet 6   sertraline (ZOLOFT) 100 MG tablet Take 1.5 tablets (150 mg total) by mouth daily. 45 tablet 6   spironolactone (ALDACTONE) 25 MG tablet Take 1 tablet (25 mg total) by mouth daily. 30 tablet 6   traMADol HCl 100 MG TABS Take 1 tablet (100 mg) by mouth every 6 (six) hours as needed for pain 90 tablet 0   traZODone  (DESYREL) 100 MG tablet Take 1 tablet (100 mg total) by mouth at bedtime. 30 tablet 11   warfarin (COUMADIN) 4 MG tablet Take 2 tablet (8 mg) daily or as directed by HF clinic (Patient taking differently: Take 8 mg by mouth daily at 4 PM. Take 3 tablets (12 mg) on Tuesdays, and 2 tablet (8 mg) all other days, or as directed by HF clinic) 90 tablet 11   Zinc Sulfate 220 (50 Zn) MG TABS Take 1 tablet (220 mg total) by mouth daily. 30 tablet  6   docusate sodium (COLACE) 100 MG capsule Take 1 capsule (100 mg total) by mouth 2 (two) times daily. (Patient not taking: Reported on 01/20/2023) 10 capsule 0   enoxaparin (LOVENOX) 60 MG/0.6ML injection Inject 0.6 mLs (60 mg total) into the skin every 12 (twelve) hours. (Patient not taking: Reported on 02/09/2023) 6 mL 0   HYDROcodone-acetaminophen (NORCO) 10-325 MG tablet Take 1 tablet by mouth every 6 (six) hours as needed for severe pain. (Patient not taking: Reported on 02/09/2023) 20 tablet 0   Semaglutide, 2 MG/DOSE, (OZEMPIC, 2 MG/DOSE,) 8 MG/3ML SOPN Inject 2 mg into the skin once a week. (Patient not taking: Reported on 02/09/2023) 3 mL 11   torsemide (DEMADEX) 20 MG tablet Take 1 tablet (20 mg total) by mouth daily as needed (If wt gain > 3 lb in 24 hr or > 5 lb in 1 week). (Patient not taking: Reported on 02/09/2023) 30 tablet 2   No current facility-administered medications for this encounter.    Aspirin, Bee venom, Lisinopril, and Shrimp (diagnostic)  REVIEW OF SYSTEMS: All systems negative except as listed in HPI, PMH and Problem list.   Vitals:   02/09/23 1107 02/09/23 1116  BP: (!) 142/0 (!) 142/95  Pulse: 72   SpO2: 95%   Weight: 132.1 kg (291 lb 3.2 oz)     Vital Signs:  Doppler Pressure: 142 Automatc BP: 142/95 (120) HR: 72 SPO2: 95% on RA   Weight: 291.2 lb w/ eqt Last weight:  290.2 lbs w/eqt     Exam: General:  NAD.  HEENT: normal  Neck: supple. JVP not elevated.  Carotids 2+ bilat; no bruits. No lymphadenopathy or thryomegaly  appreciated. Cor: LVAD hum.  Lungs: Clear. Abdomen: obese soft, nontender, non-distended. No hepatosplenomegaly. No bruits or masses. Good bowel sounds. Driveline site clean. Anchor in place. Extremities: no cyanosis, clubbing, rash. Warm no edema  Neuro: alert & oriented x 3. No focal deficits. Moves all 4 without problem    ASSESSMENT AND PLAN:  .1. Chronic systolic CHF - Nonischemic CM, end-stage - EF 2012; 20-25%  - Echo  8/22: EF < 20%, severe LV dilation, moderate RV dysfunction.   - RHC (8/22): well compensated hemodynamics. - s/p S-ICD - S/p HM3 LVAD 10/17 (DT) - Doing well - NYHA I. He is back at gym 2x/week - Continue Bidil 2 tabs tid - Continue spironolactone 25 mg daily  - Continue entresto to 97/130 bid - Again stressed need to continue weight loss efforts (see below). Will try to get him back on Ozempic.. Once BMI closer to 35 will refer to [facility] for begin transplant evaluation  Body mass index is 45.61 kg/m.  2. VAD  - s/p HM-3 placement 07/21/21  - VAD interrogated personally. Parameters stable. - INR 4.0 Goal INR 2.0-3.0 Discussed dosing with PharmD personally. - LDH 186 - DL site improving with VASHE. Continue cefadroxil for suppression. Followed by ID.  - Hgb 13.7  3. DL site infection - As above. Site improved.  - Continue cefadroxil for long-term suppression  4 Atrial fibrillation: Paroxysmal - Had severe AF with RVR 7/22 s/p DC-CV x 2 - Seen by EP. Not a candidate for ablation due to body habitus  (could consider AVN ablation and CRT, if needed)  - Had post-op AF with RVR post VAD - 10/23 S/P DC-CV with restoration of NSR. - Remains in NSR. Continue amio 100 daily. No change - On warfarin. INR management as above  5. HTN - MAPs elevated.  Increase doxazosin   6. CKD stage 3B  - Has solitary kidney.   - Baseline creatinine is 1.5-1.8.  - Creatinine1.39 today   7. OSA - Continue CPAP   8. DM2  - On Jardiance. - Hgb A1c 9.4. -> 5.9 - PCP  following - Continue Crestor 10  9. Polyarticular gout. - quiescent  10. Depression/anxiety - Continue Zoloft 150mg  daily - Much improved  11. Morbid obesity - Body mass index is 45.61 kg/m. - Resume GLP1RA  12.  Palpitations/dizziness - Zio 12/23 - 77 runs SVT. Continue amio. PVCs 1.9% PACs 4.7% - resolved  Total time spent 40 minutes. Over half that time spent discussing above.   Arvilla Meres, MD  10:30 PM

## 2023-02-15 ENCOUNTER — Telehealth (HOSPITAL_COMMUNITY): Payer: Self-pay | Admitting: Licensed Clinical Social Worker

## 2023-02-15 ENCOUNTER — Encounter: Payer: Self-pay | Admitting: Obstetrics and Gynecology

## 2023-02-15 ENCOUNTER — Other Ambulatory Visit: Payer: Medicaid Other | Admitting: Obstetrics and Gynecology

## 2023-02-15 ENCOUNTER — Other Ambulatory Visit: Payer: Self-pay

## 2023-02-15 NOTE — Patient Instructions (Signed)
Visit Information  Mr. Seide was given information about Medicaid Managed Care team care coordination services and verbally consented to engagement with the [facility] Managed Care team.   Mr. Eye - following are the goals we discussed in your visit today:   Goals Addressed             This Visit's Progress    Protect My Health       Timeframe:  Long-Range Goal Priority:  High Start Date:    09/24/20                         Expected End Date:  ongoing           - schedule recommended health tests  - schedule and keep appointment for annual check-up  02/15/23:  has BP check tom, PCP 6/13, followed by [facility]   Patient verbalizes understanding of instructions and care plan provided today and agrees to view in MyChart. Active MyChart status and patient understanding of how to access instructions and care plan via MyChart confirmed with patient.     The Managed Medicaid care management team will reach out to the patient again over the next 45 business  days.  The  Patient has been provided with contact information for the Managed Medicaid care management team and has been advised to call with any health related questions or concerns.   Following is a copy of your plan of care:  Care Plan : General Plan of Care (Adult)  Updates made by Danie Chandler, RN since 02/15/2023 12:00 AM     Problem: Health Promotion or Disease Self-Management (General Plan of Care)   Priority: High  Onset Date: 01/29/2023  Note:   Current Barriers:  Chronic Disease Management support and education needs, S/P LVAD 07/21/21, DM, HTN, OSA, CHF, Afib, CKD, gout, DDD, HLD, depression,  h/o renal Ca 02/15/23;  Patient states his Medicaid was not approved-he is working with his case worker and [facility] SW.  Line drive infection continues to improve with weekly dressing changes and chronic suppressive therapy.  Goes to gym twice a week.  Has BP check tomorrow-recent increased BP 142/95.  Nurse Case  Manager Clinical Goal(s):  Over the next 90 days, patient will attend all scheduled medical appointments: Over the next 90 days, patient will have dental appt scheduled  Interventions:  Inter-disciplinary care team collaboration (see longitudinal plan of care) Collaborated with BSW  BSW referral for disability information-completed Evaluation of current treatment plan and patient's adherence to plan as established by provider. .  Reviewed medications with patient. Discussed plans with patient for ongoing care management follow up and provided patient with direct contact information for care management team Provided patient with educational materials related to exercise. Reviewed scheduled/upcoming provider appointments. Collaborated with Pharmacy Pharmacy referral for medication review  AFIB Interventions: (Status:  New goal.) Long Term Goal   Reviewed importance of adherence to anticoagulant exactly as prescribed Counseled on importance of regular laboratory monitoring as prescribed Afib action plan reviewed Assessed social determinant of health barriers  Heart Failure Interventions:  (Status:  New goal.) Long Term Goal Basic overview and discussion of pathophysiology of Heart Failure reviewed Discussed the importance of keeping all appointments with provider Assessed social determinant of health barriers    Chronic Kidney Disease Interventions:  (Status:  New goal.) Long Term Goal Evaluation of current treatment plan related to chronic kidney disease self management and patient's adherence to  plan as established by provider      Reviewed prescribed diet  Reviewed medications with patient and discussed importance of compliance    Reviewed scheduled/upcoming provider appointments  Assessed social determinant of health barriers    Last practice recorded BP readings:  BP Readings from Last 3 Encounters:  12/11/22 (!) 130/0  10/19/22 (!) 92/0  09/16/22 103/76  01/20/23          142/95 Most recent eGFR/CrCl: No results found for: "EGFR"  No components found for: "CRCL"  Diabetes Interventions:  (Status:  New goal.) Long Term Goal Assessed patient's understanding of A1c goal: <7% Reviewed medications with patient and discussed importance of medication adherence Counseled on importance of regular laboratory monitoring as prescribed Discussed plans with patient for ongoing care management follow up and provided patient with direct contact information for care management team Reviewed scheduled/upcoming provider appointments  Advised patient, providing education and rationale, to check cbg as directed and record, calling provider  for findings outside established parameters Review of patient status, including review of consultants reports, relevant laboratory and other test results, and medications completed Assessed social determinant of health barriers Lab Results  Component Value Date   HGBA1C 5.3 09/16/2022             HGBA1C                                                      5.9                                    01/20/2023  Hyperlipidemia Interventions:  (Status:  New goal.) Long Term Goal Medication review performed; medication list updated in electronic medical record.  Provider established cholesterol goals reviewed Counseled on importance of regular laboratory monitoring as prescribed Reviewed importance of limiting foods high in cholesterol Assessed social determinant of health barriers   Hypertension Interventions:  (Status:  New goal.) Long Term Goal Last practice recorded BP readings:  BP Readings from Last 3 Encounters:  12/11/22 (!) 130/0  10/19/22 (!) 92/0  09/16/22 103/76   Most recent eGFR/CrCl: No results found for: "EGFR"  No components found for: "CRCL"  Evaluation of current treatment plan related to hypertension self management and patient's adherence to plan as established by provider Reviewed medications with patient and discussed  importance of compliance Discussed plans with patient for ongoing care management follow up and provided patient with direct contact information for care management team Advised patient, providing education and rationale, to monitor blood pressure daily and record, calling PCP for findings outside established parameters Reviewed scheduled/upcoming provider appointments  Assessed social determinant of health barriers   Patient Goals/Self-Care Activities Over the next 90 days, patient will:  -Attends all scheduled provider appointments Calls provider office for new concerns or questionss  Follow Up Plan: The Managed Medicaid care management team will reach out to the patient again over the next 45 business  days.  The patient has been provided with contact information for the Managed Medicaid care management team and has been advised to call with any health related questions or concerns.    Kathi Der RN, BSN [address]  [employer], production - Managed Medicaid High Risk [phone]

## 2023-02-15 NOTE — Patient Outreach (Signed)
Medicaid Managed Care   Nurse Care Manager Note  02/15/2023 Name:  Jeffrey [name] MRN:  [id number] DOB:  1975-12-05  Jeffrey [name] is an 47 y.o. year old male who is a primary patient of Ivonne Andrew, NP.  [facility] Managed Care Coordination team was consulted for assistance with:    Chronic healthcare management needs, OSA, VAD, DM, HTN, CHF, DLD, CKD, gout, h/o renal cancer, HLD, depression, Afib  Mr. Ertl was given information about Medicaid Managed Care Coordination team services today. Jeffrey [name] Patient agreed to services and verbal consent obtained.  Engaged with patient by telephone for follow up visit in response to provider referral for case management and/or care coordination services.   Assessments/Interventions:  Review of past medical history, allergies, medications, health status, including review of consultants reports, laboratory and other test data, was performed as part of comprehensive evaluation and provision of chronic care management services.  SDOH (Social Determinants of Health) assessments and interventions performed: SDOH Interventions    Flowsheet Row Patient Outreach Telephone from 12/29/2022 in [place] POPULATION HEALTH DEPARTMENT Patient Outreach Telephone from 12/08/2022 in [place] POPULATION HEALTH DEPARTMENT Patient Outreach Telephone from 10/30/2022 in [place] POPULATION HEALTH DEPARTMENT Patient Outreach Telephone from 10/12/2022 in [place] POPULATION HEALTH DEPARTMENT Patient Outreach Telephone from 10/02/2022 in [place] POPULATION HEALTH DEPARTMENT Patient Outreach Telephone from 07/30/2022 in [facility] Care Coordination  SDOH Interventions        Transportation Interventions -- Intervention Not Indicated -- -- -- --  Utilities Interventions -- -- -- -- -- Intervention Not Indicated  Alcohol Usage Interventions Intervention Not Indicated (Score <7) -- -- Intervention Not Indicated (Score <7) -- Intervention Not  Indicated (Score <7)  Financial Strain Interventions -- -- Other (Comment)  [patient working with BSW] -- Other (Comment)  [referral placed] --  Physical Activity Interventions -- Intervention Not Indicated -- -- -- Intervention Not Indicated  Stress Interventions -- -- Other (Comment)  [SW following-concerns over wait for disability and bills due.] -- Other (Comment)  [referral placed for BSW-patient will also f/u with SW at [facility]] --     Care Plan  Allergies  Allergen Reactions   Aspirin Shortness Of Breath, Itching and Rash     Burning sensation (Patient reports he tolerates other NSAIDS)    Bee Venom Hives and Swelling   Lisinopril Cough   Shrimp (Diagnostic) Rash    Medications Reviewed Today     Reviewed by Danie Chandler, RN (Registered Nurse) on 02/15/23 at 1246  Med List Status: <None>   Medication Order Taking? Sig Documenting Provider Last Dose Status Informant  Accu-Chek FastClix Lancets MISC [id number] No USE AS DIRECTED FOUR TIMES DAILY Bensimhon, Bevelyn Buckles, MD Taking Active   acetaminophen (TYLENOL) 325 MG tablet [id number] No Take 2 tablets (650 mg total) by mouth every 6 (six) hours as needed for fever. Tonye Becket D, NP Taking Active Self, Pharmacy Records  allopurinol (ZYLOPRIM) 100 MG tablet [id number] No Take 1 tablet (100 mg total) by mouth daily. Bensimhon, Bevelyn Buckles, MD Taking Active Self, Pharmacy Records  amiodarone (PACERONE) 200 MG tablet [id number] No Take 1 tablet (200 mg total) by mouth daily. Bensimhon, Bevelyn Buckles, MD Taking Active Self, Pharmacy Records  amLODipine (NORVASC) 10 MG tablet [id number] No Take 1 tablet (10 mg total) by mouth daily Bensimhon, Bevelyn Buckles, MD Taking Active Self, Pharmacy Records  cefadroxil (DURICEF) 500 MG capsule [id number] No Take 1 capsule (500 mg total) by  mouth 2 (two) times daily. Danelle Earthly, MD Taking Active   colchicine 0.6 MG tablet [id number] No Take 1 tablet (0.6 mg total) by mouth once daily. Bensimhon, Bevelyn Buckles, MD  Taking Active Self, Pharmacy Records  diclofenac Sodium (VOLTAREN) 1 % GEL [id number] No Apply 2 g topically 4 (four) times daily.  Patient taking differently: Apply 2 g topically 4 (four) times daily as needed (pain).   Horton Chin, MD Taking Active Self, Pharmacy Records  docusate sodium (COLACE) 100 MG capsule [id number] No Take 1 capsule (100 mg total) by mouth 2 (two) times daily.  Patient not taking: Reported on 01/20/2023   Sherald Hess, NP Not Taking Active Self, Pharmacy Records  doxazosin (CARDURA) 1 MG tablet [id number] No Take 2 tablets (2 mg total) by mouth daily. Bensimhon, Bevelyn Buckles, MD Taking Active   doxazosin (CARDURA) 2 MG tablet [id number]  Take 1 tablet (2 mg total) by mouth 2 (two) times daily. Bensimhon, Bevelyn Buckles, MD  Active   enoxaparin (LOVENOX) 60 MG/0.6ML injection [id number] No Inject 0.6 mLs (60 mg total) into the skin every 12 (twelve) hours.  Patient not taking: Reported on 02/09/2023   Bensimhon, Bevelyn Buckles, MD Not Taking Active   gabapentin (NEURONTIN) 400 MG capsule [id number] No Take 1 capsule (400 mg total) by mouth 3 (three) times daily. Bensimhon, Bevelyn Buckles, MD Taking Active   glucose blood test strip [id number] No Use as directed 4 times daily. Orion Crook I, NP Taking Active Self, Pharmacy Records  HYDROcodone-acetaminophen [facility]) 10-325 MG tablet [id number] No Take 1 tablet by mouth every 6 (six) hours as needed for severe pain.  Patient not taking: Reported on 02/09/2023   Bensimhon, Bevelyn Buckles, MD Not Taking Active   Insulin Pen Needle (TRUEPLUS 5-BEVEL PEN NEEDLES) 29G X 12.7MM MISC [id number] No Use as directed in the morning, at noon, and at bedtime. Kathrynn Speed, NP Taking Active Self, Pharmacy Records  Insulin Syringe-Needle U-100 (ULTICARE INSULIN SYRINGE) 30G X 1/2" 1 ML MISC [id number] No USE TO INJECT INSULIN TWICE DAILY. Bensimhon, Bevelyn Buckles, MD Taking Active   isosorbide-hydrALAZINE (BIDIL) 20-37.5 MG tablet [id number] No Take 2 tablets by mouth 3  (three) times daily. Bensimhon, Bevelyn Buckles, MD Taking Active   meclizine (ANTIVERT) 25 MG tablet [id number] No Take 1 tablet (25 mg total) by mouth 3 (three) times daily as needed for dizziness. Bensimhon, Bevelyn Buckles, MD Taking Active Self, Pharmacy Records  metFORMIN (GLUCOPHAGE) 1000 MG tablet [id number] No Take 1 tablet (1,000 mg total) by mouth 2 (two) times daily with a meal. Bensimhon, Bevelyn Buckles, MD Taking Active   pantoprazole (PROTONIX) 40 MG tablet [id number] No Take 1 tablet (40 mg total) by mouth once daily. Bensimhon, Bevelyn Buckles, MD Taking Active Self, Pharmacy Records  rosuvastatin (CRESTOR) 10 MG tablet [id number] No Take 1 tablet (10 mg total) by mouth daily. Bensimhon, Bevelyn Buckles, MD Taking Active   sacubitril-valsartan (ENTRESTO) 97-103 MG [id number] No Take 1 tablet by mouth 2 (two) times daily. Bensimhon, Bevelyn Buckles, MD Taking Active Self, Pharmacy Records  Semaglutide, 2 MG/DOSE, (OZEMPIC, 2 MG/DOSE,) 8 MG/3ML SOPN [id number] No Inject 2 mg into the skin once a week.  Patient not taking: Reported on 02/09/2023   Bensimhon, Bevelyn Buckles, MD Not Taking Active   sertraline (ZOLOFT) 100 MG tablet [id number] No Take 1.5 tablets (150 mg total) by mouth daily. Bensimhon, Bevelyn Buckles, MD Taking Active Self, Pharmacy Records  spironolactone (ALDACTONE) 25 MG tablet [id number] No Take 1 tablet (25  mg total) by mouth daily. Bensimhon, Bevelyn Buckles, MD Taking Active   torsemide (DEMADEX) 20 MG tablet [id number] No Take 1 tablet (20 mg total) by mouth daily as needed (If wt gain > 3 lb in 24 hr or > 5 lb in 1 week).  Patient not taking: Reported on 02/09/2023   Ivonne Andrew, NP Not Taking Active Self, Pharmacy Records  traMADol HCl 100 MG TABS [id number] No Take 1 tablet (100 mg) by mouth every 6 (six) hours as needed for pain Bensimhon, Bevelyn Buckles, MD Taking Active   traZODone (DESYREL) 100 MG tablet [id number] No Take 1 tablet (100 mg total) by mouth at bedtime. Bensimhon, Bevelyn Buckles, MD Taking Active Self, Pharmacy Records   warfarin (COUMADIN) 4 MG tablet [id number] No Take 2 tablet (8 mg) daily or as directed by HF clinic  Patient taking differently: Take 8 mg by mouth daily at 4 PM. Take 3 tablets (12 mg) on Tuesdays, and 2 tablet (8 mg) all other days, or as directed by HF clinic   Bensimhon, Bevelyn Buckles, MD Taking Active            Med Note Lamar Benes, [place]   Fri Dec 11, 2022 11:56 AM)    Zinc Sulfate 220 (50 Zn) MG TABS [id number] No Take 1 tablet (220 mg total) by mouth daily. 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/10/2022   Presence of left ventricular assist device (LVAD) (HCC) 07/25/2021   Acute on chronic systolic CHF (congestive heart failure) (HCC) 07/18/2021   Cardiogenic shock (HCC)    Atrial fibrillation with RVR (HCC) 04/30/2021   Acute hypoxemic respiratory failure (HCC)    Right wrist pain 04/16/2021   Absolute anemia    Rectal bleeding    Iron deficiency 12/25/2020   Long term current use of anticoagulant - apixaban 12/25/2020   Paroxysmal atrial fibrillation (HCC)    Lactic acidosis 11/22/2020   Hypotension 11/22/2020   Aspiration into airway    ICD (implantable cardioverter-defibrillator) battery depletion 05/15/2020   Endotracheal tube present    Respiratory failure (HCC)    Acute encephalopathy    Acute pulmonary edema (HCC)    NICM (nonischemic cardiomyopathy) (HCC) 04/24/2020   S/P vasectomy 12/20/2019   Anxiety 12/20/2019   Hemoglobin A1C between 7% and 9% indicating borderline diabetic control (HCC) 12/20/2019   Seasonal allergies 12/20/2019   Insomnia 12/20/2019   Vasectomy evaluation 06/20/2019   Visual problems 06/20/2019   Blurry vision, bilateral 06/20/2019   Hyperglycemia 02/13/2019   Hyperosmolar (nonketotic) coma (HCC) 01/19/2019   AICD (automatic cardioverter/defibrillator) present 01/19/2019   Hyperlipidemia  12/07/2018   Follow-up exam 11/22/2018   Class 3 severe obesity due to excess calories with serious comorbidity and body mass index (BMI) of 45.0 to 49.9 in adult (HCC) 11/22/2018   Dizziness 11/22/2018   Lingular pneumonia 08/04/2018   Ventricular tachycardia (HCC) 12/30/2017   PAF (paroxysmal atrial fibrillation) (HCC)    Dilated cardiomyopathy (HCC)    Pollen allergy 02/17/2017   Dyslipidemia 02/17/2017   Hematochezia 11/08/2015   CKD (chronic kidney disease), stage III (HCC) 06/20/2015   Cardiac defibrillator in situ    Chronic systolic CHF (congestive heart failure) (HCC) 03/11/2015   Gouty arthritis 03/11/2015   Chest pain 12/28/2014   Acute renal failure superimposed on stage 3 chronic  kidney disease (HCC) 04/08/2014   Aspirin allergy 04/08/2014   Renal cell carcinoma (HCC)    Gout attack 11/10/2011   Morbid obesity (HCC) 10/23/2011   OSA (obstructive sleep apnea) 10/23/2011   Type 2 diabetes mellitus with stage 3 chronic kidney disease, with long-term current use of insulin (HCC) 10/22/2011   Hyperlipidemia associated with type 2 diabetes mellitus (HCC) 12/25/2010   Hypertension associated with diabetes (HCC) 12/25/2010   Conditions to be addressed/monitored per PCP order:  Chronic healthcare management needs, OSA, VAD, DM, HTN, CHF, DLD, CKD, gout, h/o renal cancer, HLD, depression, Afib  Care Plan : General Plan of Care (Adult)  Updates made by Danie Chandler, RN since 02/15/2023 12:00 AM     Problem: Health Promotion or Disease Self-Management (General Plan of Care)   Priority: High  Onset Date: 01/29/2023  Note:   Current Barriers:  Chronic Disease Management support and education needs, S/P LVAD 07/21/21, DM, HTN, OSA, CHF, Afib, CKD, gout, DDD, HLD, depression,  h/o renal Ca 02/15/23;  Patient states his Medicaid was not approved-he is working with his case worker and Heart and Vascular SW.  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  Goes to gym twice a week.  Has BP check tomorrow-recent increased BP 142/95.  Nurse Case Manager Clinical Goal(s):  Over the next 90 days, patient will attend all scheduled medical appointments: Over the next 90 days, patient will have dental appt scheduled  Interventions:  Inter-disciplinary care team collaboration (see longitudinal plan of care) Collaborated with BSW  BSW referral for disability information-completed Evaluation of current treatment plan and patient's adherence to plan as established by provider. .  Reviewed medications with patient. Discussed plans with patient for ongoing care management follow up and provided patient with direct contact information for care management team Provided patient with educational materials related to exercise. Reviewed scheduled/upcoming provider appointments. Collaborated with Pharmacy Pharmacy referral for medication review  AFIB Interventions: (Status:  New goal.) Long Term Goal   Reviewed importance of adherence to anticoagulant exactly as prescribed Counseled on importance of regular laboratory monitoring as prescribed Afib action plan reviewed Assessed social determinant of health barriers  Heart Failure Interventions:  (Status:  New goal.) Long Term Goal Basic overview and discussion of pathophysiology of Heart Failure reviewed Discussed the importance of keeping all appointments with provider Assessed social determinant of health barriers    Chronic Kidney Disease Interventions:  (Status:  New goal.) Long Term Goal Evaluation of current treatment plan related to chronic kidney disease self management and patient's adherence to plan as established by provider      Reviewed prescribed diet  Reviewed medications with patient and discussed importance of compliance    Reviewed scheduled/upcoming provider appointments  Assessed social determinant of health barriers    Last practice recorded BP readings:   BP Readings from Last 3 Encounters:  12/11/22 (!) 130/0  10/19/22 (!) 92/0  09/16/22 103/76  01/20/23         142/95 Most recent eGFR/CrCl: No results found for: "EGFR"  No components found for: "CRCL"  Diabetes Interventions:  (Status:  New goal.) Long Term Goal Assessed patient's understanding of A1c goal: <7% Reviewed medications with patient and discussed importance of medication adherence Counseled on importance of regular laboratory monitoring as prescribed Discussed plans with patient for ongoing care management follow up and provided patient with direct contact information for care management team Reviewed scheduled/upcoming provider appointments  Advised patient,  providing education and rationale, to check cbg as directed and record, calling provider  for findings outside established parameters Review of patient status, including review of consultants reports, relevant laboratory and other test results, and medications completed Assessed social determinant of health barriers Lab Results  Component Value Date   HGBA1C 5.3 09/16/2022             HGBA1C                                                      5.9                                    01/20/2023  Hyperlipidemia Interventions:  (Status:  New goal.) Long Term Goal Medication review performed; medication list updated in electronic medical record.  Provider established cholesterol goals reviewed Counseled on importance of regular laboratory monitoring as prescribed Reviewed importance of limiting foods high in cholesterol Assessed social determinant of health barriers   Hypertension Interventions:  (Status:  New goal.) Long Term Goal Last practice recorded BP readings:  BP Readings from Last 3 Encounters:  12/11/22 (!) 130/0  10/19/22 (!) 92/0  09/16/22 103/76   Most recent eGFR/CrCl: No results found for: "EGFR"  No components found for: "CRCL"  Evaluation of current treatment plan related to hypertension self  management and patient's adherence to plan as established by provider Reviewed medications with patient and discussed importance of compliance Discussed plans with patient for ongoing care management follow up and provided patient with direct contact information for care management team Advised patient, providing education and rationale, to monitor blood pressure daily and record, calling PCP for findings outside established parameters Reviewed scheduled/upcoming provider appointments  Assessed social determinant of health barriers   Patient Goals/Self-Care Activities Over the next 90 days, patient will:  -Attends all scheduled provider appointments Calls provider office for new concerns or questionss  Follow Up Plan: The Managed Medicaid care management team will reach out to the patient again over the next 45 business  days.  The patient has been provided with contact information for the Managed Medicaid care management team and has been advised to call with any health related questions or concerns.    Follow Up:  Patient agrees to Care Plan and Follow-up.  Plan: The Managed Medicaid care management team will reach out to the patient again over the next 45 business  days. and The  Patient has been provided with contact information for the Managed Medicaid care management team and has been advised to call with any health related questions or concerns.  Date/time of next scheduled RN care management/care coordination outreach: 04/02/23 at 0900.

## 2023-02-15 NOTE — Telephone Encounter (Signed)
CSW received a return message from [employer] stating that patient was terminated from [employer] on 01-02-23 due to over income.  Patient's medicaid Ms. Dungee [phone]. Patient can apply for the deductible Medicaid program in the amount of $10,644 if he accrues unpaid medical bills by June 30 th. Patient was suppose to contact CSW to reschedule appointment form last week to review possible options. CSW will review this information when able to contact. [name], LCSW, CCSW-MCS [phone]

## 2023-02-16 ENCOUNTER — Encounter (HOSPITAL_COMMUNITY): Payer: Self-pay

## 2023-02-16 ENCOUNTER — Other Ambulatory Visit (HOSPITAL_COMMUNITY): Payer: Medicaid Other

## 2023-02-17 ENCOUNTER — Ambulatory Visit (HOSPITAL_COMMUNITY): Payer: Self-pay | Admitting: Pharmacist

## 2023-02-17 ENCOUNTER — Ambulatory Visit (HOSPITAL_COMMUNITY)
Admission: RE | Admit: 2023-02-17 | Discharge: 2023-02-17 | Disposition: A | Discharge status: Home / Self Care | Payer: Medicaid Other | Source: Ambulatory Visit | Attending: Cardiology | Admitting: Cardiology

## 2023-02-17 ENCOUNTER — Other Ambulatory Visit: Payer: Self-pay

## 2023-02-17 DIAGNOSIS — Z95811 Presence of heart assist device: Secondary | ICD-10-CM | POA: Insufficient documentation

## 2023-02-17 DIAGNOSIS — I5022 Chronic systolic (congestive) heart failure: Secondary | ICD-10-CM

## 2023-02-17 DIAGNOSIS — Z7901 Long term (current) use of anticoagulants: Secondary | ICD-10-CM | POA: Diagnosis not present

## 2023-02-17 DIAGNOSIS — I10 Essential (primary) hypertension: Secondary | ICD-10-CM

## 2023-02-17 LAB — PROTIME-INR
INR: 3.6 — ABNORMAL HIGH (ref 0.8–1.2)
Prothrombin Time: 36.1 seconds — ABNORMAL HIGH (ref 11.4–15.2)

## 2023-02-17 MED ORDER — SPIRONOLACTONE 25 MG PO TABS
50.0000 mg | ORAL_TABLET | Freq: Every day | ORAL | 6 refills | Status: DC
Start: 2023-02-17 — End: 2023-06-17
  Filled 2023-02-17 – 2023-03-10 (×2): qty 60, 30d supply, fill #0
  Filled 2023-04-28 – 2023-05-10 (×2): qty 60, 30d supply, fill #1

## 2023-02-17 NOTE — Progress Notes (Signed)
CSW met with patient and reviewed paperwork received from [employer]. According to [facility], patient has been approved for SSI although due to wife's income many months throughout the year they are over the income limit which makes him ineligible for payment and therefore ineligible for medicaid. He did receive a few monthly payments for some of the months where they were under the income threshold. CSW also shared conversation with [facility] worker Ms Jerrye Noble [phone] regarding his medicaid eligibility and confirmation of termination due to over income limits.   CSW discussed [employer] application as well as considering his wife adding him to her insurance through her employer. Patient will discuss with his wife and if decides to pursue [employer] will complete application and return for assistance if needed. [name], LCSW, CCSW-MCS [phone]

## 2023-02-17 NOTE — Addendum Note (Signed)
Encounter addended by: Marcy Siren, LCSW on: 02/17/2023 11:01 AM  Actions taken: Clinical Note Signed

## 2023-02-17 NOTE — Addendum Note (Signed)
Encounter addended by: Bernita Raisin, RN on: 02/17/2023 9:55 AM  Actions taken: Visit diagnoses modified, Pharmacy for encounter modified, Order list changed, Diagnosis association updated, Clinical Note Signed

## 2023-02-17 NOTE — Patient Instructions (Addendum)
Increase Spironolactone to 50 mg (2 tablets) daily Coumadin dosing per Lauren PharmD Return to clinic in 1 week for BP check and labs

## 2023-02-17 NOTE — Progress Notes (Addendum)
Patient presents for INR and BP check in [facility] today alone. Reports no problem with VAD or equipment.  Pt states he is overall doing well. Denies lightheadedness, dizziness, falls, shortness of breath, heart failure symptoms and signs of bleeding. Reports he has a headache this morning.   Reports he increased Cardura to 2 mg BID as instructed last visit. Confirms he has taken his medication this morning. BP elevated today (see vitals below). Discussed with Dr Gala Romney. Order received to increase Spironolactone to 50 mg daily. Updated prescription sent to pt's pharmacy. Will recheck BP and BMET next week. Pt verbalized understanding.    [name] CSW in to discuss disability and Medicaid questions/issues. See her note for details.   Vital Signs:  Doppler Pressure: 138 Automatc BP: 143/96 (114) repeat- 154/95 (119) HR: 95 SPO2: 95% on RA  Plan:  Increase Spironolactone to 50 mg (2 tablets) daily Coumadin dosing per Lauren PharmD Return to clinic in 1 week for BP check and labs  Alyce Pagan RN VAD Coordinator  Office: [phone]  24/7 Pager: [phone]

## 2023-02-18 ENCOUNTER — Other Ambulatory Visit: Payer: Self-pay

## 2023-02-18 ENCOUNTER — Other Ambulatory Visit (HOSPITAL_COMMUNITY): Payer: Self-pay | Admitting: Unknown Physician Specialty

## 2023-02-18 DIAGNOSIS — Z95811 Presence of heart assist device: Secondary | ICD-10-CM

## 2023-02-18 DIAGNOSIS — Z7901 Long term (current) use of anticoagulants: Secondary | ICD-10-CM

## 2023-02-19 ENCOUNTER — Other Ambulatory Visit: Payer: Self-pay

## 2023-02-22 ENCOUNTER — Other Ambulatory Visit: Payer: Self-pay

## 2023-02-23 ENCOUNTER — Other Ambulatory Visit: Payer: Self-pay

## 2023-02-24 ENCOUNTER — Other Ambulatory Visit (HOSPITAL_COMMUNITY): Payer: Medicaid Other

## 2023-02-25 ENCOUNTER — Ambulatory Visit (HOSPITAL_COMMUNITY): Payer: Self-pay | Admitting: Pharmacist

## 2023-02-25 ENCOUNTER — Ambulatory Visit (HOSPITAL_COMMUNITY)
Admission: RE | Admit: 2023-02-25 | Discharge: 2023-02-25 | Disposition: A | Discharge status: Home / Self Care | Payer: Medicaid Other | Source: Ambulatory Visit | Attending: Cardiology | Admitting: Cardiology

## 2023-02-25 ENCOUNTER — Encounter (HOSPITAL_COMMUNITY): Payer: Self-pay

## 2023-02-25 DIAGNOSIS — Z95811 Presence of heart assist device: Secondary | ICD-10-CM | POA: Diagnosis present

## 2023-02-25 DIAGNOSIS — Z7901 Long term (current) use of anticoagulants: Secondary | ICD-10-CM | POA: Insufficient documentation

## 2023-02-25 LAB — BASIC METABOLIC PANEL
Anion gap: 9 (ref 5–15)
BUN: 32 mg/dL — ABNORMAL HIGH (ref 6–20)
CO2: 22 mmol/L (ref 22–32)
Calcium: 9.7 mg/dL (ref 8.9–10.3)
Chloride: 107 mmol/L (ref 98–111)
Creatinine, Ser: 1.65 mg/dL — ABNORMAL HIGH (ref 0.61–1.24)
GFR, Estimated: 51 mL/min — ABNORMAL LOW (ref >60)
Glucose, Bld: 123 mg/dL — ABNORMAL HIGH (ref 70–99)
Potassium: 4.4 mmol/L (ref 3.5–5.1)
Sodium: 138 mmol/L (ref 135–145)

## 2023-02-25 LAB — PROTIME-INR
INR: 1.3 — ABNORMAL HIGH (ref 0.8–1.2)
Prothrombin Time: 16.4 seconds — ABNORMAL HIGH (ref 11.4–15.2)

## 2023-02-25 NOTE — Progress Notes (Signed)
Patient presents for INR and BP check in [facility] today alone. Reports no problem with VAD or equipment.  Pt states he is overall doing well. Denies lightheadedness, dizziness, falls, shortness of breath, heart failure symptoms and signs of bleeding. Reports he has a headache this morning.   Confirms he has taken his medication this morning. Spironolactone increased to 50 mg daily last visit. BP elevated stable this morning. See below.  Pt states he has been under a lot of stress recently due to multiple factors in his life. Asked pt if he would like to speak with CSW today. Pt denies as he has another obligation. Pt advised to call [facility] if we can assist in any way.  Vital Signs:  Doppler Pressure: 118 Automatic BP: 106/56 (70) repeat- 122/78 (98)  Labs: Creatinine: 1.65- plan to repeat BMET next week to trend  Plan:  Return to [facility] 5/29 for INR Please reach out to [facility] if you experience  lightheadedness, dizziness or shortness of breath.  Simmie Davies RN,BSN VAD Coordinator  Office: [phone]  24/7 Pager: [phone]

## 2023-02-26 ENCOUNTER — Other Ambulatory Visit (HOSPITAL_COMMUNITY): Payer: Self-pay

## 2023-02-26 DIAGNOSIS — Z95811 Presence of heart assist device: Secondary | ICD-10-CM

## 2023-02-26 DIAGNOSIS — Z7901 Long term (current) use of anticoagulants: Secondary | ICD-10-CM

## 2023-03-03 ENCOUNTER — Ambulatory Visit (HOSPITAL_COMMUNITY): Payer: Self-pay | Admitting: Pharmacist

## 2023-03-03 ENCOUNTER — Other Ambulatory Visit: Payer: Self-pay

## 2023-03-03 ENCOUNTER — Ambulatory Visit (INDEPENDENT_AMBULATORY_CARE_PROVIDER_SITE_OTHER): Payer: Medicaid Other

## 2023-03-03 ENCOUNTER — Ambulatory Visit (HOSPITAL_COMMUNITY)
Admission: RE | Admit: 2023-03-03 | Discharge: 2023-03-03 | Disposition: A | Discharge status: Home / Self Care | Payer: Self-pay | Source: Ambulatory Visit | Attending: Internal Medicine | Admitting: Internal Medicine

## 2023-03-03 DIAGNOSIS — Z7901 Long term (current) use of anticoagulants: Secondary | ICD-10-CM | POA: Insufficient documentation

## 2023-03-03 DIAGNOSIS — I420 Dilated cardiomyopathy: Secondary | ICD-10-CM

## 2023-03-03 DIAGNOSIS — Z95811 Presence of heart assist device: Secondary | ICD-10-CM | POA: Insufficient documentation

## 2023-03-03 LAB — PROTIME-INR
INR: 1.6 — ABNORMAL HIGH (ref 0.8–1.2)
Prothrombin Time: 19.3 seconds — ABNORMAL HIGH (ref 11.4–15.2)

## 2023-03-03 LAB — BASIC METABOLIC PANEL
Anion gap: 8 (ref 5–15)
BUN: 30 mg/dL — ABNORMAL HIGH (ref 6–20)
CO2: 19 mmol/L — ABNORMAL LOW (ref 22–32)
Calcium: 9.8 mg/dL (ref 8.9–10.3)
Chloride: 111 mmol/L (ref 98–111)
Creatinine, Ser: 1.55 mg/dL — ABNORMAL HIGH (ref 0.61–1.24)
GFR, Estimated: 55 mL/min — ABNORMAL LOW (ref >60)
Glucose, Bld: 113 mg/dL — ABNORMAL HIGH (ref 70–99)
Potassium: 4.5 mmol/L (ref 3.5–5.1)
Sodium: 138 mmol/L (ref 135–145)

## 2023-03-04 LAB — CUP PACEART REMOTE DEVICE CHECK
Battery Remaining Percentage: 81 %
Date Time Interrogation Session: 20240529114000
Implantable Lead Connection Status: 753985
Implantable Lead Implant Date: 20160812
Implantable Lead Location: 753862
Implantable Lead Model: 3401
Implantable Pulse Generator Implant Date: 20210812
Pulse Gen Serial Number: 139514

## 2023-03-09 ENCOUNTER — Telehealth: Payer: Self-pay | Admitting: *Deleted

## 2023-03-09 ENCOUNTER — Other Ambulatory Visit: Payer: Self-pay

## 2023-03-09 NOTE — Telephone Encounter (Addendum)
Received page from pt reporting he has been feeling unwell since Saturday. C/o intermittent nausea, productive cough, and back pain. Denies fever, chills, shortness of breath, chest pain, abdominal pain, signs of bleeding, and urinary symptoms (urgency, frequency, burning). Reports he has had loose stool, but this is not uncommon for him, and he attributes this to taking Colchicine. Denies sick contacts, but does say his son has the same cough and nausea. Denies injury to his back. He feels like his back pain may be from coughing. Reports he intermittently feels weak. Denies falls. VAD numbers stable. Denies VAD alarms. He is taking all medications as prescribed. He has taken a home COVID test, but this has not resulted yet. Advised him to notify VAD coordinator if test is positive. He verbalized understanding.   States he has tried to contact his PCP but has not heard back. States he does not feel he needs to be seen in ER. Advised he may go to urgent care if symptoms do not resolve. He states he may go to urgent care in the morning. Advised to notify VAD coordinator if symptoms worsen and he feels he needs to go to ER. He verbalized understanding.   Alyce Pagan RN VAD Coordinator  Office: [phone]  24/7 Pager: [phone]

## 2023-03-10 ENCOUNTER — Other Ambulatory Visit: Payer: Self-pay

## 2023-03-10 ENCOUNTER — Other Ambulatory Visit (HOSPITAL_COMMUNITY): Payer: Self-pay

## 2023-03-10 DIAGNOSIS — Z95811 Presence of heart assist device: Secondary | ICD-10-CM

## 2023-03-10 DIAGNOSIS — Z7901 Long term (current) use of anticoagulants: Secondary | ICD-10-CM

## 2023-03-11 ENCOUNTER — Other Ambulatory Visit: Payer: Self-pay

## 2023-03-11 ENCOUNTER — Telehealth (INDEPENDENT_AMBULATORY_CARE_PROVIDER_SITE_OTHER): Payer: Self-pay | Admitting: Nurse Practitioner

## 2023-03-11 DIAGNOSIS — J069 Acute upper respiratory infection, unspecified: Secondary | ICD-10-CM

## 2023-03-11 MED ORDER — AMOXICILLIN 500 MG PO CAPS
500.0000 mg | ORAL_CAPSULE | Freq: Two times a day (BID) | ORAL | 0 refills | Status: AC
  Filled 2023-03-11: qty 14, 7d supply, fill #0

## 2023-03-11 MED ORDER — AMLODIPINE BESYLATE 10 MG PO TABS
10.0000 mg | ORAL_TABLET | Freq: Every day | ORAL | 6 refills | Status: DC
  Filled 2023-03-11: qty 30, 30d supply, fill #0
  Filled 2023-04-13: qty 30, 30d supply, fill #1
  Filled 2023-05-10: qty 30, 30d supply, fill #2

## 2023-03-11 MED ORDER — PREDNISONE 20 MG PO TABS
20.0000 mg | ORAL_TABLET | Freq: Every day | ORAL | 0 refills | Status: AC
Start: 2023-03-11 — End: 2023-03-17
  Filled 2023-03-11: qty 5, 5d supply, fill #0

## 2023-03-11 NOTE — Progress Notes (Signed)
Virtual Visit via Video Note  I connected with [name] on 03/11/23 at  3:00 PM EDT by a video enabled telemedicine application and verified that I am speaking with the correct person using two identifiers.  Location: Patient: home Provider: office   I discussed the limitations of evaluation and management by telemedicine and the availability of in person appointments. The patient expressed understanding and agreed to proceed.  History of Present Illness:  Patient presents today for an acute visit through video visit.  He states for the past few days he has been having a cough but has felt dizzy at times.  He has had congestion.  He did take a home COVID test that was negative.  We discussed that going into the weekend we will go ahead and start him on antibiotic and low-dose prednisone. Denies f/c/s, n/v/d, hemoptysis, PND, leg swelling Denies chest pain or edema     Observations/Objective:     02/25/2023    1:04 PM 02/25/2023    1:03 PM 02/25/2023    1:02 PM  Vitals with BMI  Systolic 118 122 409  Diastolic 0 78 56      Assessment and Plan:  1. Upper respiratory tract infection, unspecified type  - predniSONE (DELTASONE) 20 MG tablet; Take 1 tablet (20 mg total) by mouth daily with breakfast for 5 days.  Dispense: 5 tablet; Refill: 0  Follow up:  Follow up in a couple of weeks for physical    I discussed the assessment and treatment plan with the patient. The patient was provided an opportunity to ask questions and all were answered. The patient agreed with the plan and demonstrated an understanding of the instructions.   The patient was advised to call back or seek an in-person evaluation if the symptoms worsen or if the condition fails to improve as anticipated.  I provided 22 minutes of non-face-to-face time during this encounter.   Ivonne Andrew, NP

## 2023-03-11 NOTE — Patient Instructions (Signed)
1. Upper respiratory tract infection, unspecified type  - predniSONE (DELTASONE) 20 MG tablet; Take 1 tablet (20 mg total) by mouth daily with breakfast for 5 days.  Dispense: 5 tablet; Refill: 0  Follow up:  Follow up in a couple of weeks for physical

## 2023-03-17 ENCOUNTER — Ambulatory Visit (HOSPITAL_COMMUNITY): Payer: Self-pay | Admitting: Pharmacist

## 2023-03-17 ENCOUNTER — Ambulatory Visit (HOSPITAL_COMMUNITY)
Admission: RE | Admit: 2023-03-17 | Discharge: 2023-03-17 | Disposition: A | Discharge status: Home / Self Care | Payer: Self-pay | Source: Ambulatory Visit | Attending: Internal Medicine | Admitting: Internal Medicine

## 2023-03-17 DIAGNOSIS — Z95811 Presence of heart assist device: Secondary | ICD-10-CM | POA: Insufficient documentation

## 2023-03-17 DIAGNOSIS — Z7901 Long term (current) use of anticoagulants: Secondary | ICD-10-CM | POA: Insufficient documentation

## 2023-03-17 LAB — BASIC METABOLIC PANEL
Anion gap: 9 (ref 5–15)
BUN: 23 mg/dL — ABNORMAL HIGH (ref 6–20)
CO2: 22 mmol/L (ref 22–32)
Calcium: 9.8 mg/dL (ref 8.9–10.3)
Chloride: 108 mmol/L (ref 98–111)
Creatinine, Ser: 1.45 mg/dL — ABNORMAL HIGH (ref 0.61–1.24)
GFR, Estimated: 59 mL/min — ABNORMAL LOW (ref >60)
Glucose, Bld: 94 mg/dL (ref 70–99)
Potassium: 4.2 mmol/L (ref 3.5–5.1)
Sodium: 139 mmol/L (ref 135–145)

## 2023-03-17 LAB — PROTIME-INR
INR: 4.8 (ref 0.8–1.2)
Prothrombin Time: 45.2 seconds — ABNORMAL HIGH (ref 11.4–15.2)

## 2023-03-17 NOTE — Progress Notes (Signed)
HOLD tonight then continue

## 2023-03-18 ENCOUNTER — Encounter: Payer: Self-pay | Admitting: Nurse Practitioner

## 2023-03-18 ENCOUNTER — Ambulatory Visit (INDEPENDENT_AMBULATORY_CARE_PROVIDER_SITE_OTHER): Payer: Self-pay | Admitting: Nurse Practitioner

## 2023-03-18 VITALS — BP 108/84 | HR 78 | Ht 67.0 in | Wt 290.0 lb

## 2023-03-18 DIAGNOSIS — E1122 Type 2 diabetes mellitus with diabetic chronic kidney disease: Secondary | ICD-10-CM

## 2023-03-18 DIAGNOSIS — N289 Disorder of kidney and ureter, unspecified: Secondary | ICD-10-CM

## 2023-03-18 DIAGNOSIS — Z13220 Encounter for screening for lipoid disorders: Secondary | ICD-10-CM

## 2023-03-18 DIAGNOSIS — N1831 Chronic kidney disease, stage 3a: Secondary | ICD-10-CM

## 2023-03-18 DIAGNOSIS — Z794 Long term (current) use of insulin: Secondary | ICD-10-CM

## 2023-03-18 LAB — POCT GLYCOSYLATED HEMOGLOBIN (HGB A1C): Hemoglobin A1C: 5.5 % (ref 4.0–5.6)

## 2023-03-18 NOTE — Assessment & Plan Note (Signed)
-   Microalbumin / creatinine urine ratio - POCT glycosylated hemoglobin (Hb A1C) - Ambulatory referral to Ophthalmology - AMB Referral to Pharmacy Medication Management - Ambulatory referral to Nephrology  2. Abnormal kidney function  - Ambulatory referral to Nephrology    Follow up:  Follow up in 3 months

## 2023-03-18 NOTE — Patient Instructions (Addendum)
1. Type 2 diabetes mellitus with stage 3a chronic kidney disease, with long-term current use of insulin (HCC)  - Microalbumin / creatinine urine ratio - POCT glycosylated hemoglobin (Hb A1C) - Ambulatory referral to Ophthalmology - AMB Referral to Pharmacy Medication Management - Ambulatory referral to Nephrology  2. Abnormal kidney function  - Ambulatory referral to Nephrology    Follow up:  Follow up in 3 months

## 2023-03-18 NOTE — Progress Notes (Signed)
@Patient  ID: Jeffrey [name], male    DOB: [birth year], 47 y.o.   MRN: [id number]  Chief Complaint  Patient presents with   Follow-up    Referring provider: Kathrynn Speed, NP   HPI  Jeffrey [name] 47 y.o. male  has a past medical history of Anxiety, Aspirin allergy, Childhood asthma, Chronic systolic CHF (congestive heart failure) (HCC), CKD (chronic kidney disease) stage 2, GFR 60-89 ml/min, H/O vasectomy (12/2019), High cholesterol, Hypertension, Morbid obesity (HCC), Nephrolithiasis, OSA on CPAP, Paroxysmal atrial fibrillation (HCC), Presumed NICM, Renal cell carcinoma (HCC), Troponin level elevated, Type II diabetes mellitus (HCC), and Ventricular tachycardia (HCC). To the [facility] for reevaluation of chronic illness.   Diabetes Mellitus: Patient presents for follow up of diabetes. Symptoms: none. Denies any symptoms. Patient denies foot ulcerations, hypoglycemia , and nausea.  Evaluation to date has been included: hemoglobin A1C.  Home sugars: patient does not check sugars. Treatment to date: no recent interventions.    Hypertension: Patient here for follow-up of elevated blood pressure. Patient is trying to loose weight for possible heart transplant. Cardiac symptoms none. Patient denies claudication, dyspnea, fatigue, and near-syncope.  Cardiovascular risk factors: hypertension, male gender, and obesity (BMI >= 30 kg/m2). Use of agents associated with hypertension: none.    Denies any other concerns today. Denies any fatigue, chest pain, shortness of breath, HA or dizziness. Denies any blurred vision, numbness or tingling.      Allergies  Allergen Reactions   Aspirin Shortness Of Breath, Itching and Rash     Burning sensation (Patient reports he tolerates other NSAIDS)    Bee Venom Hives and Swelling   Lisinopril Cough   Tomato Hives    Immunization History  Administered Date(s) Administered   Influenza Split 10/24/2011   Influenza,inj,Quad PF,6+ Mos 07/23/2017, 09/06/2018,  09/18/2019   Pneumococcal Conjugate-13 11/01/2017   Pneumococcal Polysaccharide-23 10/24/2011   Tdap 04/07/2015    Past Medical History:  Diagnosis Date   Anxiety    Aspirin allergy    Childhood asthma    Chronic systolic CHF (congestive heart failure) (HCC)    a. EF 20-25% in 2012. b. EF 45-50% in 10/2011 with nonischemic nuc - presumed NICM. c. 12/2014 Echo: Sev depressed LV fxn, sev dil LV, mild LVH, mild MR, sev dil LA, mildly reduced RV fxn.   CKD (chronic kidney disease) stage 2, GFR 60-89 ml/min    H/O vasectomy 12/2019   High cholesterol    Hypertension    Morbid obesity (HCC)    Nephrolithiasis    OSA on CPAP    Paroxysmal atrial fibrillation (HCC)    Presumed NICM    a. 04/2014 Myoview: EF 26%, glob HK, sev glob HK, ? prior infarct;  b. Never cathed 2/2 CKD.   Renal cell carcinoma (HCC)    a. s/p Rt robotic assisted partial converted to radical nephrectomy on 01/2013.   Troponin level elevated    a. 04/2014, 12/2014: felt due to CHF.   Type II diabetes mellitus (HCC)    Ventricular tachycardia (HCC)    a. appropriate ICD therapy 12/2017    Tobacco History: Social History   Tobacco Use  Smoking Status Former   Packs/day: 0.25   Years: 22.00   Additional pack years: 0.00   Total pack years: 5.50   Types: Cigarettes   Quit date: 04/30/2015   Years since quitting: 7.8  Smokeless Tobacco Never   Counseling given: Not Answered   Outpatient Encounter Medications as of 03/18/2023  Medication  Sig   Accu-Chek FastClix Lancets MISC USE AS DIRECTED FOUR TIMES DAILY   acetaminophen (TYLENOL) 325 MG tablet Take 2 tablets (650 mg total) by mouth every 6 (six) hours as needed for fever.   allopurinol (ZYLOPRIM) 100 MG tablet Take 1 tablet (100 mg total) by mouth daily.   amiodarone (PACERONE) 200 MG tablet Take 1 tablet (200 mg total) by mouth daily.   amLODipine (NORVASC) 10 MG tablet Take 1 tablet (10 mg total) by mouth daily   amoxicillin (AMOXIL) 500 MG capsule Take 1  capsule (500 mg total) by mouth 2 (two) times daily for 7 days.   cefadroxil (DURICEF) 500 MG capsule Take 1 capsule (500 mg total) by mouth 2 (two) times daily.   colchicine 0.6 MG tablet Take 1 tablet (0.6 mg total) by mouth once daily.   doxazosin (CARDURA) 2 MG tablet Take 1 tablet (2 mg total) by mouth 2 (two) times daily.   gabapentin (NEURONTIN) 400 MG capsule Take 1 capsule (400 mg total) by mouth 3 (three) times daily.   glucose blood test strip Use as directed 4 times daily.   Insulin Pen Needle (TRUEPLUS 5-BEVEL PEN NEEDLES) 29G X 12.7MM MISC Use as directed in the morning, at noon, and at bedtime.   Insulin Syringe-Needle U-100 (ULTICARE INSULIN SYRINGE) 30G X 1/2" 1 ML MISC USE TO INJECT INSULIN TWICE DAILY.   isosorbide-hydrALAZINE (BIDIL) 20-37.5 MG tablet Take 2 tablets by mouth 3 (three) times daily.   meclizine (ANTIVERT) 25 MG tablet Take 1 tablet (25 mg total) by mouth 3 (three) times daily as needed for dizziness.   metFORMIN (GLUCOPHAGE) 1000 MG tablet Take 1 tablet (1,000 mg total) by mouth 2 (two) times daily with a meal.   pantoprazole (PROTONIX) 40 MG tablet Take 1 tablet (40 mg total) by mouth once daily.   rosuvastatin (CRESTOR) 10 MG tablet Take 1 tablet (10 mg total) by mouth daily.   sacubitril-valsartan (ENTRESTO) 97-103 MG Take 1 tablet by mouth 2 (two) times daily.   sertraline (ZOLOFT) 100 MG tablet Take 1.5 tablets (150 mg total) by mouth daily.   spironolactone (ALDACTONE) 25 MG tablet Take 2 tablets (50 mg total) by mouth daily.   traMADol HCl 100 MG TABS Take 1 tablet (100 mg) by mouth every 6 (six) hours as needed for pain   traZODone (DESYREL) 100 MG tablet Take 1 tablet (100 mg total) by mouth at bedtime.   warfarin (COUMADIN) 4 MG tablet Take 2 tablet (8 mg) daily or as directed by HF clinic (Patient taking differently: Take 8 mg by mouth daily at 4 PM. Take 3 tablets (12 mg) on Tuesdays, and 2 tablet (8 mg) all other days, or as directed by HF clinic)    Zinc Sulfate 220 (50 Zn) MG TABS Take 1 tablet (220 mg total) by mouth daily.   [DISCONTINUED] diclofenac Sodium (VOLTAREN) 1 % GEL Apply 2 g topically 4 (four) times daily. (Patient taking differently: Apply 2 g topically 4 (four) times daily as needed (pain).)   Semaglutide, 2 MG/DOSE, (OZEMPIC, 2 MG/DOSE,) 8 MG/3ML SOPN Inject 2 mg into the skin once a week. (Patient not taking: Reported on 02/09/2023)   [DISCONTINUED] docusate sodium (COLACE) 100 MG capsule Take 1 capsule (100 mg total) by mouth 2 (two) times daily. (Patient not taking: Reported on 01/20/2023)   [DISCONTINUED] doxazosin (CARDURA) 1 MG tablet Take 2 tablets (2 mg total) by mouth daily. (Patient not taking: Reported on 03/18/2023)   [DISCONTINUED] enoxaparin (LOVENOX) 60  MG/0.6ML injection Inject 0.6 mLs (60 mg total) into the skin every 12 (twelve) hours. (Patient not taking: Reported on 02/09/2023)   [DISCONTINUED] HYDROcodone-acetaminophen (NORCO) 10-325 MG tablet Take 1 tablet by mouth every 6 (six) hours as needed for severe pain. (Patient not taking: Reported on 02/09/2023)   [DISCONTINUED] torsemide (DEMADEX) 20 MG tablet Take 1 tablet (20 mg total) by mouth daily as needed (If wt gain > 3 lb in 24 hr or > 5 lb in 1 week). (Patient not taking: Reported on 02/09/2023)   No facility-administered encounter medications on file as of 03/18/2023.     Review of Systems  Review of Systems  Constitutional: Negative.   HENT: Negative.    Cardiovascular: Negative.   Gastrointestinal: Negative.   Allergic/Immunologic: Negative.   Neurological: Negative.   Psychiatric/Behavioral: Negative.         Physical Exam  BP 108/84   Pulse 78   Ht 5\' 7"  (1.702 m)   Wt 290 lb (131.5 kg)   SpO2 98%   BMI 45.42 kg/m   Wt Readings from Last 5 Encounters:  03/18/23 290 lb (131.5 kg)  02/09/23 291 lb 3.2 oz (132.1 kg)  01/20/23 284 lb (128.8 kg)  10/19/22 292 lb 6.4 oz (132.6 kg)  10/08/22 285 lb (129.3 kg)     Physical Exam Vitals  and nursing note reviewed.  Constitutional:      General: He is not in acute distress.    Appearance: He is well-developed.  Cardiovascular:     Rate and Rhythm: Normal rate and regular rhythm.  Pulmonary:     Effort: Pulmonary effort is normal.     Breath sounds: Normal breath sounds.  Skin:    General: Skin is warm and dry.  Neurological:     Mental Status: He is alert and oriented to person, place, and time.      Lab Results:  CBC    Component Value Date/Time   WBC 7.0 02/09/2023 1058   RBC 5.12 02/09/2023 1058   HGB 13.7 02/09/2023 1058   HGB 12.8 (L) 10/28/2020 0932   HCT 41.5 02/09/2023 1058   HCT 39.8 10/28/2020 0932   PLT 238 02/09/2023 1058   PLT 292 10/28/2020 0932   MCV 81.1 02/09/2023 1058   MCV 79 10/28/2020 0932   MCH 26.8 02/09/2023 1058   MCHC 33.0 02/09/2023 1058   RDW 15.9 (H) 02/09/2023 1058   RDW 15.1 10/28/2020 0932   LYMPHSABS 1,903 01/20/2023 1108   LYMPHSABS 2.9 10/28/2020 0932   MONOABS 0.8 05/19/2022 1457   EOSABS 302 01/20/2023 1108   EOSABS 0.2 10/28/2020 0932   BASOSABS 60 01/20/2023 1108   BASOSABS 0.1 10/28/2020 0932    BMET    Component Value Date/Time   NA 139 03/17/2023 1212   NA 138 09/04/2020 1039   K 4.2 03/17/2023 1212   CL 108 03/17/2023 1212   CO2 22 03/17/2023 1212   GLUCOSE 94 03/17/2023 1212   BUN 23 (H) 03/17/2023 1212   BUN 23 09/04/2020 1039   CREATININE 1.45 (H) 03/17/2023 1212   CREATININE 1.36 (H) 01/20/2023 1108   CALCIUM 9.8 03/17/2023 1212   GFRNONAA 59 (L) 03/17/2023 1212   GFRAA 66 09/04/2020 1039    BNP    Component Value Date/Time   BNP 47.0 05/19/2022 1457    ProBNP    Component Value Date/Time   PROBNP 163.0 (H) 07/05/2014 0906    Imaging: CUP PACEART REMOTE DEVICE CHECK  Result Date: 03/04/2023 Scheduled  remote reviewed. Normal device function.  2 AF episodes showing both ectopy and undersensing - clinic aware per 2/28 PA report Next remote 91 days. LA, CVRSSensing Configuration:  Alternate Gain Setting: 2X Post Shock Pacing: OFF    Assessment & Plan:   Type 2 diabetes mellitus with stage 3 chronic kidney disease, with long-term current use of insulin (HCC) - Microalbumin / creatinine urine ratio - POCT glycosylated hemoglobin (Hb A1C) - Ambulatory referral to Ophthalmology - AMB Referral to Pharmacy Medication Management - Ambulatory referral to Nephrology  2. Abnormal kidney function  - Ambulatory referral to Nephrology    Follow up:  Follow up in 3 months     Ivonne Andrew, NP 03/18/2023

## 2023-03-19 LAB — LIPID PANEL
Chol/HDL Ratio: 2.7 ratio (ref 0.0–5.0)
Cholesterol, Total: 123 mg/dL (ref 100–199)
HDL: 46 mg/dL (ref >39)
LDL Chol Calc (NIH): 53 mg/dL (ref 0–99)
Triglycerides: 138 mg/dL (ref 0–149)
VLDL Cholesterol Cal: 24 mg/dL (ref 5–40)

## 2023-03-19 LAB — CBC
Hematocrit: 42.3 % (ref 37.5–51.0)
Hemoglobin: 13.1 g/dL (ref 13.0–17.7)
MCH: 25.4 pg — ABNORMAL LOW (ref 26.6–33.0)
MCHC: 31 g/dL — ABNORMAL LOW (ref 31.5–35.7)
MCV: 82 fL (ref 79–97)
Platelets: 270 10*3/uL (ref 150–450)
RBC: 5.16 x10E6/uL (ref 4.14–5.80)
RDW: 14.6 % (ref 11.6–15.4)
WBC: 9.5 10*3/uL (ref 3.4–10.8)

## 2023-03-19 LAB — MICROALBUMIN / CREATININE URINE RATIO
Creatinine, Urine: 148 mg/dL
Microalb/Creat Ratio: 460 mg/g creat — ABNORMAL HIGH (ref 0–29)
Microalbumin, Urine: 681.1 ug/mL

## 2023-03-23 ENCOUNTER — Other Ambulatory Visit (HOSPITAL_COMMUNITY): Payer: Self-pay | Admitting: *Deleted

## 2023-03-23 ENCOUNTER — Other Ambulatory Visit (HOSPITAL_COMMUNITY): Payer: Self-pay

## 2023-03-23 ENCOUNTER — Telehealth: Payer: Self-pay

## 2023-03-23 DIAGNOSIS — Z95811 Presence of heart assist device: Secondary | ICD-10-CM

## 2023-03-23 DIAGNOSIS — Z7901 Long term (current) use of anticoagulants: Secondary | ICD-10-CM

## 2023-03-23 NOTE — Progress Notes (Unsigned)
   Care Guide Note  03/23/2023 Name: GARRIE COCCA MRN: [id number] DOB: 30-Jul-1976  Referred by: Ivonne Andrew, NP Reason for referral : Care Coordination (Outreach to schedule with Pharm d in July )   AHMI NUNNERY is a 47 y.o. year old male who is a primary care patient of Ivonne Andrew, NP. [name] was referred to the pharmacist for assistance related to DM.    An unsuccessful telephone outreach was attempted today to contact the patient who was referred to the pharmacy team for assistance with medication management. Additional attempts will be made to contact the patient.   Penne Lash, RMA Care Guide [facility]  [place], [place] [address] Direct Dial: [phone] [email or web address]

## 2023-03-25 NOTE — Progress Notes (Signed)
Remote ICD transmission.   

## 2023-03-26 ENCOUNTER — Ambulatory Visit (HOSPITAL_COMMUNITY): Payer: Self-pay | Admitting: Pharmacist

## 2023-03-26 ENCOUNTER — Ambulatory Visit (HOSPITAL_COMMUNITY)
Admission: RE | Admit: 2023-03-26 | Discharge: 2023-03-26 | Disposition: A | Discharge status: Home / Self Care | Payer: Self-pay | Source: Ambulatory Visit | Attending: Internal Medicine | Admitting: Internal Medicine

## 2023-03-26 DIAGNOSIS — Z7901 Long term (current) use of anticoagulants: Secondary | ICD-10-CM | POA: Insufficient documentation

## 2023-03-26 DIAGNOSIS — Z95811 Presence of heart assist device: Secondary | ICD-10-CM | POA: Insufficient documentation

## 2023-03-26 LAB — PROTIME-INR
INR: 2.8 — ABNORMAL HIGH (ref 0.8–1.2)
Prothrombin Time: 29.8 seconds — ABNORMAL HIGH (ref 11.4–15.2)

## 2023-03-26 NOTE — Progress Notes (Unsigned)
   Care Guide Note  03/26/2023 Name: Jeffrey [name] MRN: [id number] DOB: [birth year]  Referred by: Ivonne Andrew, NP Reason for referral : Care Coordination (Outreach to schedule with Pharm d in July )   Jeffrey [name] is a 47 y.o. year old male who is a primary care patient of Ivonne Andrew, NP. Jeffrey [name] was referred to the pharmacist for assistance related to DM.    A second unsuccessful telephone outreach was attempted today to contact the patient who was referred to the pharmacy team for assistance with medication management. Additional attempts will be made to contact the patient.  Jeffrey [name], Jeffrey [name] Care Guide [facility]  [place], [place] [address] Direct Dial: [phone] [name]

## 2023-04-01 NOTE — Progress Notes (Signed)
   Care Guide Note  04/01/2023 Name: ORA MCNATT MRN: [id number] DOB: 01-Jul-1976  Referred by: Ivonne Andrew, NP Reason for referral : Care Coordination (Outreach to schedule with Pharm d in July )   Jeffrey [name] is a 47 y.o. year old male who is a primary care patient of Ivonne Andrew, NP. [name] was referred to the pharmacist for assistance related to DM.    Successful contact was made with the patient to discuss pharmacy services including being ready for the pharmacist to call at least 5 minutes before the scheduled appointment time, to have medication bottles and any blood sugar or blood pressure readings ready for review. The patient agreed to meet with the pharmacist via with the pharmacist via telephone visit on (date/time).  05/05/2023  Penne Lash, RMA Care Guide [facility]  [place], [place] [address] Direct Dial: [phone] [email or web address]

## 2023-04-02 ENCOUNTER — Other Ambulatory Visit: Payer: Medicaid Other | Admitting: Obstetrics and Gynecology

## 2023-04-02 ENCOUNTER — Other Ambulatory Visit (HOSPITAL_COMMUNITY): Payer: Self-pay | Admitting: *Deleted

## 2023-04-02 DIAGNOSIS — Z7901 Long term (current) use of anticoagulants: Secondary | ICD-10-CM

## 2023-04-02 DIAGNOSIS — Z95811 Presence of heart assist device: Secondary | ICD-10-CM

## 2023-04-02 NOTE — Patient Instructions (Signed)
Hey [name] am sorry I did not reach you today, I hope you are doing okay  - as a part of your Medicaid benefit, you are eligible for care management and care coordination services at no cost or copay. I was unable to reach you by phone today but would be happy to help you with your health related needs. Please feel free to call me at [phone]   A member of the Managed Medicaid care management team will reach out to you again over the next 30 business  days.   Kathi Der RN, BSN [employer]  [employer] - Managed Medicaid High Risk [phone]

## 2023-04-02 NOTE — Patient Outreach (Signed)
  Medicaid Managed Care   Unsuccessful Attempt Note   04/02/2023 Name: Jeffrey [name] MRN: [id number] DOB: November 12, 1975  Referred by: Ivonne Andrew, NP Reason for referral : High Risk Managed Medicaid (Unsuccessful telephone outreach)  An unsuccessful telephone outreach was attempted today. The patient was referred to the case management team for assistance with care management and care coordination.    Follow Up Plan: The Managed Medicaid care management team will reach out to the patient again over the next 30 business  days. and The  Patient has been provided with contact information for the Managed Medicaid care management team and has been advised to call with any health related questions or concerns.   Kathi Der RN, BSN [place]  [employer] - Managed Medicaid High Risk [phone]

## 2023-04-05 ENCOUNTER — Other Ambulatory Visit (HOSPITAL_COMMUNITY): Payer: Self-pay

## 2023-04-06 ENCOUNTER — Telehealth (HOSPITAL_COMMUNITY): Payer: Self-pay | Admitting: Licensed Clinical Social Worker

## 2023-04-06 ENCOUNTER — Ambulatory Visit (HOSPITAL_COMMUNITY): Payer: Self-pay | Admitting: Pharmacist

## 2023-04-06 ENCOUNTER — Ambulatory Visit (HOSPITAL_COMMUNITY)
Admission: RE | Admit: 2023-04-06 | Discharge: 2023-04-06 | Disposition: A | Discharge status: Home / Self Care | Payer: Self-pay | Source: Ambulatory Visit | Attending: Cardiology | Admitting: Cardiology

## 2023-04-06 DIAGNOSIS — Z7901 Long term (current) use of anticoagulants: Secondary | ICD-10-CM | POA: Insufficient documentation

## 2023-04-06 DIAGNOSIS — Z95811 Presence of heart assist device: Secondary | ICD-10-CM | POA: Insufficient documentation

## 2023-04-06 LAB — PROTIME-INR
INR: 2 — ABNORMAL HIGH (ref 0.8–1.2)
Prothrombin Time: 23.2 seconds — ABNORMAL HIGH (ref 11.4–15.2)

## 2023-04-06 NOTE — Telephone Encounter (Signed)
CSW attempted to contact patient regarding insurance concerns with no answer and message left for return call. [name], LCSW, CCSW-MCS [phone]

## 2023-04-07 ENCOUNTER — Other Ambulatory Visit: Payer: Self-pay

## 2023-04-12 ENCOUNTER — Other Ambulatory Visit: Payer: Self-pay

## 2023-04-13 ENCOUNTER — Other Ambulatory Visit: Payer: Self-pay

## 2023-04-13 ENCOUNTER — Other Ambulatory Visit (HOSPITAL_COMMUNITY): Payer: Self-pay

## 2023-04-13 DIAGNOSIS — Z95811 Presence of heart assist device: Secondary | ICD-10-CM

## 2023-04-13 DIAGNOSIS — Z7901 Long term (current) use of anticoagulants: Secondary | ICD-10-CM

## 2023-04-14 ENCOUNTER — Ambulatory Visit (HOSPITAL_COMMUNITY)
Admission: RE | Admit: 2023-04-14 | Discharge: 2023-04-14 | Disposition: A | Discharge status: Home / Self Care | Payer: Self-pay | Source: Ambulatory Visit | Attending: Internal Medicine | Admitting: Internal Medicine

## 2023-04-14 ENCOUNTER — Encounter (HOSPITAL_COMMUNITY): Payer: Self-pay | Admitting: Internal Medicine

## 2023-04-14 ENCOUNTER — Ambulatory Visit (HOSPITAL_COMMUNITY): Payer: Self-pay | Admitting: Pharmacist

## 2023-04-14 DIAGNOSIS — Z4801 Encounter for change or removal of surgical wound dressing: Secondary | ICD-10-CM | POA: Insufficient documentation

## 2023-04-14 DIAGNOSIS — Z95811 Presence of heart assist device: Secondary | ICD-10-CM | POA: Insufficient documentation

## 2023-04-14 DIAGNOSIS — Z7901 Long term (current) use of anticoagulants: Secondary | ICD-10-CM | POA: Insufficient documentation

## 2023-04-14 LAB — BASIC METABOLIC PANEL
Anion gap: 8 (ref 5–15)
BUN: 21 mg/dL — ABNORMAL HIGH (ref 6–20)
CO2: 22 mmol/L (ref 22–32)
Calcium: 9.3 mg/dL (ref 8.9–10.3)
Chloride: 109 mmol/L (ref 98–111)
Creatinine, Ser: 1.57 mg/dL — ABNORMAL HIGH (ref 0.61–1.24)
GFR, Estimated: 54 mL/min — ABNORMAL LOW (ref >60)
Glucose, Bld: 107 mg/dL — ABNORMAL HIGH (ref 70–99)
Potassium: 3.9 mmol/L (ref 3.5–5.1)
Sodium: 139 mmol/L (ref 135–145)

## 2023-04-14 LAB — CBC
HCT: 40.5 % (ref 39.0–52.0)
Hemoglobin: 13.1 g/dL (ref 13.0–17.0)
MCH: 26.3 pg (ref 26.0–34.0)
MCHC: 32.3 g/dL (ref 30.0–36.0)
MCV: 81.3 fL (ref 80.0–100.0)
Platelets: 260 10*3/uL (ref 150–400)
RBC: 4.98 MIL/uL (ref 4.22–5.81)
RDW: 16.4 % — ABNORMAL HIGH (ref 11.5–15.5)
WBC: 8 10*3/uL (ref 4.0–10.5)
nRBC: 0 % (ref 0.0–0.2)

## 2023-04-14 LAB — PROTIME-INR
INR: 1.5 — ABNORMAL HIGH (ref 0.8–1.2)
Prothrombin Time: 18.4 seconds — ABNORMAL HIGH (ref 11.4–15.2)

## 2023-04-14 LAB — LACTATE DEHYDROGENASE: LDH: 190 U/L (ref 98–192)

## 2023-04-14 NOTE — Addendum Note (Signed)
Encounter addended by: Marcy Siren, LCSW on: 04/14/2023 4:41 PM  Actions taken: Clinical Note Signed

## 2023-04-14 NOTE — Progress Notes (Signed)
Patient presents for 2 mo f/u  in VAD Clinic today alone. Reports no problem with VAD or equipment.  Pt states he is overall doing well. Denies lightheadedness, dizziness, falls, shortness of breath, heart failure symptoms and signs of bleeding.   Pt confirms he increased Cardura to 2 mg BID as instructed last visit.   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   He has not taken Ozempic for the last month due to insurance difficulties.   [name] CSW in to discuss disability and Medicaid questions/issues. See her note for details.   Pt reports his wife is doing daily dressing changes due to his walking/working out daily. He says site looks good and he will change later today after his work out.   Vital Signs:  Doppler Pressure: 98 Automatc BP: 124/74 (94) HR: 78 SPO2: 95% on RA   Weight: 295.4 lb w/ eqt Last weight:  291.2 lbs w/eqt   VAD Indication: Destination Therapy due to smoking and BMI   VAD interrogation & Equipment Management: Speed: 6200 Flow: 5.1 Power: 5.3 w    PI: 2.9 Hct: 38   Alarms: few LV alarms, one no external power - pt report his power went out Events: 10 - 20 daily  Fixed speed 6200 Low speed limit: 5900   Primary Controller:  Replace back up battery in 9 months. Back up controller:   not present today   Annual Equipment Maintenance on UBC/PM was performed on 10/19/22.    I reviewed the LVAD parameters from today and compared the results to the patient's prior recorded data. LVAD interrogation was NEGATIVE for significant power changes, NEGATIVE for clinical alarms and STABLE for PI events/speed drops. No programming changes were made and pump is functioning within specified parameters. Pt is performing daily controller and system monitor self tests along with completing weekly and monthly maintenance for LVAD equipment.   LVAD equipment check completed and is in good working order. Back-up  equipment not present.    Exit Site Care: Drive line being maintained daily by pt's wife. Gauze dressing dry and intact; anchor in place and accurately applied. Pt denies fever or chills. Continue daily dressing changes due to excessive sweating during summer with activities. Clean with VASHE solution, with VASHE moistened 2 x 2 at exit site. Provided pt with 14 daily kits, 14 anchors, for home use. States he has enough VASHE solution at home.    Device:  Left subcutaneous dual ICD Therapies: on at 250 BPM Last check: 03/04/22  BP & Labs:  Doppler 98 - Doppler is reflecting MAP   Hgb 13.1 - No S/S of bleeding. Specifically denies melena/BRBPR or nosebleeds.   LDH stable at 190 with established baseline of 160 - 300. Denies tea-colored urine. No power elevations noted on interrogation.    Plan:  No change in medication. Labs today - will call you if abnormal. Return to VAD Clinic in 2 months for 2 year Intermacs and annual maintenance. Please bring your home VAD equipment for annual maintenance.    Hessie Diener RN VAD Coordinator  Office: [phone]  24/7 Pager: [phone]

## 2023-04-14 NOTE — Progress Notes (Signed)
CSW met with patient in the clinic to discuss insurance needs. Patient lost his medicaid a few months back due to apparently being over income. Patient reports that he completed an online application although when CSW contacted Medicaid there is nothing on file. CSW spoke with Daria Pastures at [facility] [phone] who states that she can complete an application over the phone with patient within the next day. CSW shared with patient to expect a call form Medicaid and follow up with an application. Patient grateful for the support. CSW will continue to follow for support and follow up regarding the medicaid. [name], LCSW, CCSW-MCS [phone]

## 2023-04-14 NOTE — Patient Instructions (Addendum)
No change in medication. Labs today - will call you if abnormal. Return to VAD Clinic in 2 months for 2 year Intermacs and annual maintenance. Please bring your home VAD equipment for annual maintenance.

## 2023-04-21 ENCOUNTER — Other Ambulatory Visit (HOSPITAL_COMMUNITY): Payer: Self-pay | Admitting: *Deleted

## 2023-04-21 DIAGNOSIS — Z95811 Presence of heart assist device: Secondary | ICD-10-CM

## 2023-04-21 DIAGNOSIS — Z7901 Long term (current) use of anticoagulants: Secondary | ICD-10-CM

## 2023-04-23 ENCOUNTER — Other Ambulatory Visit: Payer: Self-pay

## 2023-04-26 ENCOUNTER — Other Ambulatory Visit (HOSPITAL_COMMUNITY): Payer: Self-pay

## 2023-04-28 ENCOUNTER — Other Ambulatory Visit: Payer: Self-pay

## 2023-04-28 ENCOUNTER — Other Ambulatory Visit (HOSPITAL_COMMUNITY): Payer: Self-pay

## 2023-04-29 ENCOUNTER — Ambulatory Visit (HOSPITAL_COMMUNITY): Payer: Self-pay

## 2023-04-29 ENCOUNTER — Encounter: Payer: Self-pay | Admitting: Obstetrics and Gynecology

## 2023-04-29 ENCOUNTER — Ambulatory Visit (HOSPITAL_COMMUNITY)
Admission: RE | Admit: 2023-04-29 | Discharge: 2023-04-29 | Disposition: A | Discharge status: Home / Self Care | Payer: Self-pay | Source: Ambulatory Visit | Attending: Cardiology | Admitting: Cardiology

## 2023-04-29 ENCOUNTER — Other Ambulatory Visit: Payer: Self-pay | Admitting: Obstetrics and Gynecology

## 2023-04-29 ENCOUNTER — Other Ambulatory Visit (HOSPITAL_COMMUNITY): Payer: Self-pay | Admitting: *Deleted

## 2023-04-29 DIAGNOSIS — Z95811 Presence of heart assist device: Secondary | ICD-10-CM | POA: Insufficient documentation

## 2023-04-29 DIAGNOSIS — Z7901 Long term (current) use of anticoagulants: Secondary | ICD-10-CM

## 2023-04-29 LAB — PROTIME-INR
INR: 4.9 (ref 0.8–1.2)
Prothrombin Time: 46.1 seconds — ABNORMAL HIGH (ref 11.4–15.2)

## 2023-04-29 NOTE — Patient Outreach (Signed)
Medicaid Managed Care   Nurse Care Manager Note  04/29/2023 Name:  Jeffrey [name] MRN:  [id number] DOB:  03/31/1976  Jeffrey [name] is an 47 y.o. year old male who is a primary patient of Ivonne Andrew, NP.  The [facility] Managed Care Coordination team was consulted for assistance with:    Chronic healthcare management needs, CHF, CKD, gout, HTN, DM, h/o renal cancer, OSA, VAD, DLD, HLD, depression  Jeffrey [name] was given information about Medicaid Managed Care Coordination team services today. Jeffrey [name] Patient agreed to services and verbal consent obtained.  Engaged with patient by telephone for follow up visit in response to provider referral for case management and/or care coordination services.   Assessments/Interventions:  Review of past medical history, allergies, medications, health status, including review of consultants reports, laboratory and other test data, was performed as part of comprehensive evaluation and provision of chronic care management services.  SDOH (Social Determinants of Health) assessments and interventions performed: SDOH Interventions    Flowsheet Row Patient Outreach Telephone from 04/29/2023 in [place] POPULATION HEALTH DEPARTMENT Patient Outreach Telephone from 12/29/2022 in [place] POPULATION HEALTH DEPARTMENT Patient Outreach Telephone from 12/08/2022 in [place] POPULATION HEALTH DEPARTMENT Patient Outreach Telephone from 10/30/2022 in [place] POPULATION HEALTH DEPARTMENT Patient Outreach Telephone from 10/12/2022 in [place] POPULATION HEALTH DEPARTMENT Patient Outreach Telephone from 10/02/2022 in [place] POPULATION HEALTH DEPARTMENT  SDOH Interventions        Food Insecurity Interventions Intervention Not Indicated -- -- -- -- --  Transportation Interventions -- -- Intervention Not Indicated -- -- --  Alcohol Usage Interventions -- Intervention Not Indicated (Score <7) -- -- Intervention Not Indicated (Score <7) --  Financial Strain  Interventions -- -- -- Other (Comment)  [patient working with BSW] -- Other (Comment)  [referral placed]  Physical Activity Interventions -- -- Intervention Not Indicated -- -- --  Stress Interventions -- -- -- Other (Comment)  [SW following-concerns over wait for disability and bills due.] -- Other (Comment)  [referral placed for BSW-patient will also f[facility] with [facility]]  Health Literacy Interventions Intervention Not Indicated -- -- -- -- --     Care Plan  Allergies  Allergen Reactions   Aspirin Shortness Of Breath, Itching and Rash     Burning sensation (Patient reports he tolerates other NSAIDS)    Bee Venom Hives and Swelling   Lisinopril Cough   Tomato Hives    Medications Reviewed Today     Reviewed by Danie Chandler, RN (Registered Nurse) on 04/29/23 at 1239  Med List Status: <None>   Medication Order Taking? Sig Documenting Provider Last Dose Status Informant  Accu-Chek FastClix Lancets MISC [id number] No USE AS DIRECTED FOUR TIMES DAILY Bensimhon, Bevelyn Buckles, MD Taking Active   acetaminophen (TYLENOL) 325 MG tablet [id number] No Take 2 tablets (650 mg total) by mouth every 6 (six) hours as needed for fever. Sherald Hess, NP Taking Active Self, Pharmacy Records           Med Note [facility], [facility]   Thu Mar 18, 2023  2:30 PM) PRN   allopurinol (ZYLOPRIM) 100 MG tablet [id number] No Take 1 tablet (100 mg total) by mouth daily. Bensimhon, Bevelyn Buckles, MD Taking Active Self, Pharmacy Records  amiodarone (PACERONE) 200 MG tablet [id number] No Take 1 tablet (200 mg total) by mouth daily. Bensimhon, Bevelyn Buckles, MD Taking Active Self, Pharmacy Records  amLODipine (NORVASC) 10 MG tablet [id number] No Take 1 tablet (10  mg total) by mouth daily Ivonne Andrew, NP Taking Active   cefadroxil (DURICEF) 500 MG capsule [id number] No Take 1 capsule (500 mg total) by mouth 2 (two) times daily. Danelle Earthly, MD Taking Active   colchicine 0.6 MG tablet [id number] No Take 1 tablet (0.6 mg  total) by mouth once daily. Bensimhon, Bevelyn Buckles, MD Taking Active Self, Pharmacy Records  doxazosin (CARDURA) 2 MG tablet [id number] No Take 1 tablet (2 mg total) by mouth 2 (two) times daily. Bensimhon, Bevelyn Buckles, MD Taking Active   gabapentin (NEURONTIN) 400 MG capsule [id number] No Take 1 capsule (400 mg total) by mouth 3 (three) times daily. Bensimhon, Bevelyn Buckles, MD Taking Active   glucose blood test strip [id number] No Use as directed 4 times daily. Kathrynn Speed, NP Taking Active Self, Pharmacy Records  Insulin Pen Needle (TRUEPLUS 5-BEVEL PEN NEEDLES) 29G X 12.7MM MISC [id number] No Use as directed in the morning, at noon, and at bedtime.  Patient not taking: Reported on 04/14/2023   Kathrynn Speed, NP Not Taking Active Self, Pharmacy Records  Insulin Syringe-Needle U-100 (ULTICARE INSULIN SYRINGE) 30G X 1/2" 1 ML MISC [id number] No USE TO INJECT INSULIN TWICE DAILY.  Patient not taking: Reported on 04/14/2023   Bensimhon, Bevelyn Buckles, MD Not Taking Active   isosorbide-hydrALAZINE (BIDIL) 20-37.5 MG tablet [id number] No Take 2 tablets by mouth 3 (three) times daily. Bensimhon, Bevelyn Buckles, MD Taking Active   meclizine (ANTIVERT) 25 MG tablet [id number] No Take 1 tablet (25 mg total) by mouth 3 (three) times daily as needed for dizziness. Bensimhon, Bevelyn Buckles, MD Taking Active Self, Pharmacy Records  metFORMIN (GLUCOPHAGE) 1000 MG tablet [id number] No Take 1 tablet (1,000 mg total) by mouth 2 (two) times daily with a meal. Bensimhon, Bevelyn Buckles, MD Taking Active   pantoprazole (PROTONIX) 40 MG tablet [id number] No Take 1 tablet (40 mg total) by mouth once daily. Bensimhon, Bevelyn Buckles, MD Taking Active Self, Pharmacy Records  rosuvastatin (CRESTOR) 10 MG tablet [id number] No Take 1 tablet (10 mg total) by mouth daily. Bensimhon, Bevelyn Buckles, MD Taking Active   sacubitril-valsartan (ENTRESTO) 97-103 MG [id number] No Take 1 tablet by mouth 2 (two) times daily. Bensimhon, Bevelyn Buckles, MD Taking Active Self, Pharmacy  Records  sertraline (ZOLOFT) 100 MG tablet [id number] No Take 1.5 tablets (150 mg total) by mouth daily. Bensimhon, Bevelyn Buckles, MD Taking Active Self, Pharmacy Records  spironolactone (ALDACTONE) 25 MG tablet [id number] No Take 2 tablets (50 mg total) by mouth daily. Bensimhon, Bevelyn Buckles, MD Taking Active   traMADol HCl 100 MG TABS [id number] No Take 1 tablet (100 mg) by mouth every 6 (six) hours as needed for pain Bensimhon, Bevelyn Buckles, MD Taking Active   traZODone (DESYREL) 100 MG tablet [id number] No Take 1 tablet (100 mg total) by mouth at bedtime. Bensimhon, Bevelyn Buckles, MD Taking Active Self, Pharmacy Records  warfarin (COUMADIN) 4 MG tablet [id number] No Take 2 tablet (8 mg) daily or as directed by HF clinic  Patient taking differently: Take 8 mg by mouth daily at 4 PM. Take 3 tablets (12 mg) on Tuesdays, and 2 tablet (8 mg) all other days, or as directed by HF clinic   Bensimhon, Bevelyn Buckles, MD Taking Active            Med Note Lamar Benes, [place]   Fri Dec 11, 2022 11:56 AM)    Zinc Sulfate 220 (50 Zn) MG TABS [id number] No Take 1 tablet (220 mg total) by mouth  daily. 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/10/2022   Presence of left ventricular assist device (LVAD) (HCC) 07/25/2021   Acute on chronic systolic CHF (congestive heart failure) (HCC) 07/18/2021   Cardiogenic shock (HCC)    Atrial fibrillation with RVR (HCC) 04/30/2021   Acute hypoxemic respiratory failure (HCC)    Right wrist pain 04/16/2021   Absolute anemia    Rectal bleeding    Iron deficiency 12/25/2020   Long term current use of anticoagulant - apixaban 12/25/2020   Paroxysmal atrial fibrillation (HCC)    Lactic acidosis 11/22/2020   Hypotension 11/22/2020   Aspiration into airway    ICD (implantable cardioverter-defibrillator) battery depletion 05/15/2020   Endotracheal tube  present    Respiratory failure (HCC)    Acute encephalopathy    Acute pulmonary edema (HCC)    NICM (nonischemic cardiomyopathy) (HCC) 04/24/2020   S/P vasectomy 12/20/2019   Anxiety 12/20/2019   Hemoglobin A1C between 7% and 9% indicating borderline diabetic control (HCC) 12/20/2019   Seasonal allergies 12/20/2019   Insomnia 12/20/2019   Vasectomy evaluation 06/20/2019   Visual problems 06/20/2019   Blurry vision, bilateral 06/20/2019   Hyperglycemia 02/13/2019   Hyperosmolar (nonketotic) coma (HCC) 01/19/2019   AICD (automatic cardioverter/defibrillator) present 01/19/2019   Hyperlipidemia 12/07/2018   Follow-up exam 11/22/2018   Class 3 severe obesity due to excess calories with serious comorbidity and body mass index (BMI) of 45.0 to 49.9 in adult (HCC) 11/22/2018   Dizziness 11/22/2018   Lingular pneumonia 08/04/2018   Ventricular tachycardia (HCC) 12/30/2017   PAF (paroxysmal atrial fibrillation) (HCC)    Dilated cardiomyopathy (HCC)    Pollen allergy 02/17/2017   Dyslipidemia 02/17/2017   Hematochezia 11/08/2015   CKD (chronic kidney disease), stage III (HCC) 06/20/2015   Cardiac defibrillator in situ    Chronic systolic CHF (congestive heart failure) (HCC) 03/11/2015   Gouty arthritis 03/11/2015   Chest pain 12/28/2014   Acute renal failure superimposed on stage 3 chronic kidney disease (HCC) 04/08/2014   Aspirin allergy 04/08/2014   Renal cell carcinoma (HCC)    Gout attack 11/10/2011   Morbid obesity (HCC) 10/23/2011   OSA (obstructive sleep apnea) 10/23/2011   Type 2 diabetes mellitus with stage 3 chronic kidney disease, with long-term current use of insulin (HCC) 10/22/2011   Hyperlipidemia associated with type 2 diabetes mellitus (HCC) 12/25/2010   Hypertension associated with diabetes (HCC) 12/25/2010   Conditions to be addressed/monitored per PCP order:  Chronic healthcare management needs, CHF, CKD, gout, HTN, DM, h/o renal cancer, OSA, VAD, DLD, HLD,  depression  Care Plan : General Plan of Care (Adult)  Updates made by Danie Chandler, RN since 04/29/2023 12:00 AM     Problem: Health Promotion or Disease Self-Management (General Plan of Care)   Priority: High  Onset Date: 01/29/2023  Note:   Current Barriers:  Chronic Disease Management support and education needs, S/P LVAD 07/21/21, DM, HTN, OSA, CHF, Afib, CKD, gout, DDD, HLD, depression,  h/o renal Ca 04/29/23: BP and DM stable.  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  Continues to work on [employer].  Nurse Case Manager Clinical Goal(s):  Over the next 90 days, patient will attend all scheduled medical appointments: Over the next 90 days, patient will have dental appt  scheduled  Interventions:  Inter-disciplinary care team collaboration (see longitudinal plan of care) Collaborated with BSW  BSW referral for disability information-completed Evaluation of current treatment plan and patient's adherence to plan as established by provider. .  Reviewed medications with patient. Discussed plans with patient for ongoing care management follow up and provided patient with direct contact information for care management team Provided patient with educational materials related to exercise. Reviewed scheduled/upcoming provider appointments. Collaborated with Pharmacy Pharmacy referral for medication review  AFIB Interventions: (Status:  New goal.) Long Term Goal   Reviewed importance of adherence to anticoagulant exactly as prescribed Counseled on importance of regular laboratory monitoring as prescribed Afib action plan reviewed Assessed social determinant of health barriers  Heart Failure Interventions:  (Status:  New goal.) Long Term Goal Basic overview and discussion of pathophysiology of Heart Failure reviewed Discussed the importance of keeping all appointments with provider Assessed social determinant of health  barriers    Chronic Kidney Disease Interventions:  (Status:  New goal.) Long Term Goal Evaluation of current treatment plan related to chronic kidney disease self management and patient's adherence to plan as established by provider      Reviewed prescribed diet  Reviewed medications with patient and discussed importance of compliance    Reviewed scheduled/upcoming provider appointments  Assessed social determinant of health barriers    Last practice recorded BP readings:  BP Readings from Last 3 Encounters:  12/11/22 (!) 130/0  10/19/22 (!) 92/0  09/16/22 103/76  01/20/23         142/9  Most recent eGFR/CrCl: No results found for: "EGFR"  No components found for: "CRCL"  Diabetes Interventions:  (Status:  New goal.) Long Term Goal Assessed patient's understanding of A1c goal: <7% Reviewed medications with patient and discussed importance of medication adherence Counseled on importance of regular laboratory monitoring as prescribed Discussed plans with patient for ongoing care management follow up and provided patient with direct contact information for care management team Reviewed scheduled/upcoming provider appointments  Advised patient, providing education and rationale, to check cbg as directed and record, calling provider  for findings outside established parameters Review of patient status, including review of consultants reports, relevant laboratory and other test results, and medications completed Assessed social determinant of health barriers Lab Results  Component Value Date   HGBA1C 5.3 09/16/2022             HGBA1C                                                      5.9                                    01/20/2023  Hyperlipidemia Interventions:  (Status:  New goal.) Long Term Goal Medication review performed; medication list updated in electronic medical record.  Provider established cholesterol goals reviewed Counseled on importance of regular laboratory monitoring  as prescribed Reviewed importance of limiting foods high in cholesterol Assessed social determinant of health barriers   Hypertension Interventions:  (Status:  New goal.) Long Term Goal Last practice recorded BP readings:  BP Readings from Last 3 Encounters:  12/11/22 (!) 130/0  10/19/22 (!) 92/0  09/16/22 103/76  03/18/23         108/84  Most recent eGFR/CrCl: No results found for: "EGFR"  No components found for: "CRCL"  Evaluation of current treatment plan related to hypertension self management and patient's adherence to plan as established by provider Reviewed medications with patient and discussed importance of compliance Discussed plans with patient for ongoing care management follow up and provided patient with direct contact information for care management team Advised patient, providing education and rationale, to monitor blood pressure daily and record, calling PCP for findings outside established parameters Reviewed scheduled/upcoming provider appointments  Assessed social determinant of health barriers   Patient Goals/Self-Care Activities Over the next 90 days, patient will:  -Attends all scheduled provider appointments Calls provider office for new concerns or questionss  Follow Up Plan: The Managed Medicaid care management team will reach out to the patient again over the next 60 business  days.  The patient has been provided with contact information for the Managed Medicaid care management team and has been advised to call with any health related questions or concerns.    Follow Up:  Patient agrees to Care Plan and Follow-up.  Plan: The Managed Medicaid care management team will reach out to the patient again over the next 60 business  days. and The  Patient has been provided with contact information for the Managed Medicaid care management team and has been advised to call with any health related questions or concerns.  Date/time of next scheduled RN care  management/care coordination outreach: 06/30/23 at 0900.

## 2023-04-29 NOTE — Progress Notes (Signed)
LVAD INR 

## 2023-04-29 NOTE — Patient Instructions (Signed)
Hi Jeffrey [name], glad you are doing well-thank you for updating me today!  Jeffrey [name] was given information about Medicaid Managed Care team care coordination services and verbally consented to engagement with the [facility] Managed Care team.   Jeffrey [name] - following are the goals we discussed in your visit today:   Goals Addressed             This Visit's Progress    Protect My Health       Timeframe:  Long-Range Goal Priority:  High Start Date:    09/24/20                         Expected End Date:  ongoing           - schedule recommended health tests  - schedule and keep appointment for annual check-up  04/29/23:  labwork done today, seen and evaluated by PCP 6/13, Heart and Vascular 7/10.  F/u appts scheduled.   Patient verbalizes understanding of instructions and care plan provided today and agrees to view in MyChart. Active MyChart status and patient understanding of how to access instructions and care plan via MyChart confirmed with patient.     The Managed Medicaid care management team will reach out to the patient again over the next 60 business  days.  The  Patient  has been provided with contact information for the Managed Medicaid care management team and has been advised to call with any health related questions or concerns.   Following is a copy of your plan of care:  Care Plan : General Plan of Care (Adult)  Updates made by Danie Chandler, RN since 04/29/2023 12:00 AM     Problem: Health Promotion or Disease Self-Management (General Plan of Care)   Priority: High  Onset Date: 01/29/2023  Note:   Current Barriers:  Chronic Disease Management support and education needs, S/P LVAD 07/21/21, DM, HTN, OSA, CHF, Afib, CKD, gout, DDD, HLD, depression,  h/o renal Ca 04/29/23: BP and DM stable.  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  Continues to work on [employer].  Nurse Case Manager Clinical Goal(s):  Over the  next 90 days, patient will attend all scheduled medical appointments: Over the next 90 days, patient will have dental appt scheduled  Interventions:  Inter-disciplinary care team collaboration (see longitudinal plan of care) Collaborated with BSW  BSW referral for disability information-completed Evaluation of current treatment plan and patient's adherence to plan as established by provider. .  Reviewed medications with patient. Discussed plans with patient for ongoing care management follow up and provided patient with direct contact information for care management team Provided patient with educational materials related to exercise. Reviewed scheduled/upcoming provider appointments. Collaborated with Pharmacy Pharmacy referral for medication review  AFIB Interventions: (Status:  New goal.) Long Term Goal   Reviewed importance of adherence to anticoagulant exactly as prescribed Counseled on importance of regular laboratory monitoring as prescribed Afib action plan reviewed Assessed social determinant of health barriers  Heart Failure Interventions:  (Status:  New goal.) Long Term Goal Basic overview and discussion of pathophysiology of Heart Failure reviewed Discussed the importance of keeping all appointments with provider Assessed social determinant of health barriers    Chronic Kidney Disease Interventions:  (Status:  New goal.) Long Term Goal Evaluation of current treatment plan related to chronic kidney disease self management and patient's adherence to plan as established by provider  Reviewed prescribed diet  Reviewed medications with patient and discussed importance of compliance    Reviewed scheduled/upcoming provider appointments  Assessed social determinant of health barriers    Last practice recorded BP readings:  BP Readings from Last 3 Encounters:  12/11/22 (!) 130/0  10/19/22 (!) 92/0  09/16/22 103/76  01/20/23         142/9  Most recent eGFR/CrCl: No  results found for: "EGFR"  No components found for: "CRCL"  Diabetes Interventions:  (Status:  New goal.) Long Term Goal Assessed patient's understanding of A1c goal: <7% Reviewed medications with patient and discussed importance of medication adherence Counseled on importance of regular laboratory monitoring as prescribed Discussed plans with patient for ongoing care management follow up and provided patient with direct contact information for care management team Reviewed scheduled/upcoming provider appointments  Advised patient, providing education and rationale, to check cbg as directed and record, calling provider  for findings outside established parameters Review of patient status, including review of consultants reports, relevant laboratory and other test results, and medications completed Assessed social determinant of health barriers Lab Results  Component Value Date   HGBA1C 5.3 09/16/2022             HGBA1C                                                      5.9                                    01/20/2023  Hyperlipidemia Interventions:  (Status:  New goal.) Long Term Goal Medication review performed; medication list updated in electronic medical record.  Provider established cholesterol goals reviewed Counseled on importance of regular laboratory monitoring as prescribed Reviewed importance of limiting foods high in cholesterol Assessed social determinant of health barriers   Hypertension Interventions:  (Status:  New goal.) Long Term Goal Last practice recorded BP readings:  BP Readings from Last 3 Encounters:  12/11/22 (!) 130/0  10/19/22 (!) 92/0  09/16/22 103/76  03/18/23         108/84  Most recent eGFR/CrCl: No results found for: "EGFR"  No components found for: "CRCL"  Evaluation of current treatment plan related to hypertension self management and patient's adherence to plan as established by provider Reviewed medications with patient and discussed importance  of compliance Discussed plans with patient for ongoing care management follow up and provided patient with direct contact information for care management team Advised patient, providing education and rationale, to monitor blood pressure daily and record, calling PCP for findings outside established parameters Reviewed scheduled/upcoming provider appointments  Assessed social determinant of health barriers   Patient Goals/Self-Care Activities Over the next 90 days, patient will:  -Attends all scheduled provider appointments Calls provider office for new concerns or questionss  Follow Up Plan: The Managed Medicaid care management team will reach out to the patient again over the next 60 business  days.  The patient has been provided with contact information for the Managed Medicaid care management team and has been advised to call with any health related questions or concerns.    Kathi Der RN, BSN [place]  [employer] - Managed Medicaid High Risk [phone]

## 2023-05-04 ENCOUNTER — Ambulatory Visit (HOSPITAL_COMMUNITY)
Admission: RE | Admit: 2023-05-04 | Discharge: 2023-05-04 | Disposition: A | Discharge status: Home / Self Care | Payer: Self-pay | Source: Ambulatory Visit | Attending: Cardiology | Admitting: Cardiology

## 2023-05-04 ENCOUNTER — Ambulatory Visit (HOSPITAL_COMMUNITY): Payer: Self-pay | Admitting: Student-PharmD

## 2023-05-04 DIAGNOSIS — Z7901 Long term (current) use of anticoagulants: Secondary | ICD-10-CM | POA: Insufficient documentation

## 2023-05-04 DIAGNOSIS — Z95811 Presence of heart assist device: Secondary | ICD-10-CM | POA: Insufficient documentation

## 2023-05-04 LAB — PROTIME-INR
INR: 2.3 — ABNORMAL HIGH (ref 0.8–1.2)
Prothrombin Time: 25.3 seconds — ABNORMAL HIGH (ref 11.4–15.2)

## 2023-05-05 ENCOUNTER — Other Ambulatory Visit: Payer: Self-pay | Admitting: Pharmacist

## 2023-05-05 ENCOUNTER — Other Ambulatory Visit: Payer: Self-pay

## 2023-05-05 NOTE — Patient Instructions (Signed)
Mr. Gahan,   Continue your current regimen. Please let me know when you have updates on Medicaid enrollment.   Thanks!  Catie Eppie Gibson, PharmD, BCACP, CPP Clinical Pharmacist [facility] [phone]

## 2023-05-05 NOTE — Addendum Note (Signed)
Addended by: Nilda Simmer T on: 05/05/2023 10:01 AM   Modules accepted: Orders

## 2023-05-05 NOTE — Progress Notes (Signed)
05/05/2023 Name: Jeffrey [name] MRN: [id number] DOB: Feb 23, 1976  Chief Complaint  Patient presents with   Medication Management   Diabetes    Jeffrey [name] is a 47 y.o. year old male who presented for a telephone visit.   They were referred to the pharmacist by their PCP for assistance in managing diabetes.    Subjective:  Care Team: Primary Care Provider: Ivonne Andrew, NP ; Next Scheduled Visit: 06/18/23 Cardiologist: Gala Romney; Next Scheduled Visit: 06/17/23  Medication Access/Adherence  Current Pharmacy:  [facility] - [facility] [address], [address] [place] [address] Phone: [phone] Fax: [phone]  [id number] - [place], [place] - [address] AT [facility] [address] [place] [address] Phone: [phone] Fax: [phone]   Patient reports affordability concerns with their medications: No  Patient reports access/transportation concerns to their pharmacy: No  Patient reports adherence concerns with their medications:  No    Notes he has been working with [employer] [employer]) to re-enroll for Medicaid. Waiting on paperwork in the mail  Diabetes:  Current medications: metformin 1000 mg twice daily Medications tried in the past: previously on Jardiance  Notes that he had been on Jardiance, but his team stopped it. Most recent note from Dr. Gala Romney indicated that patient was still on Jardiance.   Patient also previously on Ozempic. Stopped due to insurance issues. Had been on 2 mg dose.   Hyperlipidemia/ASCVD Risk Reduction  Current lipid lowering medications: rosuvastatin 10 mg daily Medications tried in the past:   Antiplatelet regimen: none due to warfarin   Heart Failure and Atrial Fibrillation  Current medications:  ACEi/ARB/ARNI: Entresto 97/103 SGLT2i: none, previously on Jardiance Beta blocker: none Mineralocorticoid Receptor Antagonist:  spironolactone 50 mg daily Diuretic regimen: none Additionally on amlodipine 10 mg daily, hydralazine/isosorbide 20/37.5 mg 2 tablets three times daily, doxazosin 2 mg QPM   Objective:  Lab Results  Component Value Date   HGBA1C 5.5 03/18/2023    Lab Results  Component Value Date   CREATININE 1.57 (H) 04/14/2023   BUN 21 (H) 04/14/2023   NA 139 04/14/2023   K 3.9 04/14/2023   CL 109 04/14/2023   CO2 22 04/14/2023    Lab Results  Component Value Date   CHOL 123 03/18/2023   HDL 46 03/18/2023   LDLCALC 53 03/18/2023   TRIG 138 03/18/2023   CHOLHDL 2.7 03/18/2023    Medications Reviewed Today     Reviewed by Alden Hipp, RPH-CPP (Pharmacist) on 05/05/23 at 0914  Med List Status: <None>   Medication Order Taking? Sig Documenting Provider Last Dose Status Informant  Accu-Chek FastClix Lancets MISC [id number]  USE AS DIRECTED FOUR TIMES DAILY Bensimhon, Bevelyn Buckles, MD  Active   acetaminophen (TYLENOL) 325 MG tablet [id number]  Take 2 tablets (650 mg total) by mouth every 6 (six) hours as needed for fever. Sherald Hess, NP  Active Self, Pharmacy Records           Med Note [facility], [facility]   Thu Mar 18, 2023  2:30 PM) PRN   allopurinol (ZYLOPRIM) 100 MG tablet [id number] Yes Take 1 tablet (100 mg total) by mouth daily. Bensimhon, Bevelyn Buckles, MD Taking Active Self, Pharmacy Records  amiodarone (PACERONE) 200 MG tablet [id number] Yes Take 1 tablet (200 mg total) by mouth daily. Bensimhon, Bevelyn Buckles, MD Taking Active Self, Pharmacy Records  amLODipine (NORVASC) 10 MG tablet [id number] Yes Take 1 tablet (  10 mg total) by mouth daily Ivonne Andrew, NP Taking Active   cefadroxil (DURICEF) 500 MG capsule [id number] Yes Take 1 capsule (500 mg total) by mouth 2 (two) times daily. Danelle Earthly, MD Taking Active   colchicine 0.6 MG tablet [id number] Yes Take 1 tablet (0.6 mg total) by mouth once daily. Bensimhon, Bevelyn Buckles, MD Taking Active Self, Pharmacy Records  doxazosin  (CARDURA) 2 MG tablet [id number] Yes Take 1 tablet (2 mg total) by mouth 2 (two) times daily. Bensimhon, Bevelyn Buckles, MD Taking Active   gabapentin (NEURONTIN) 400 MG capsule [id number] Yes Take 1 capsule (400 mg total) by mouth 3 (three) times daily. Bensimhon, Bevelyn Buckles, MD Taking Active   glucose blood test strip [id number]  Use as directed 4 times daily. Orion Crook I, NP  Active Self, Pharmacy Records  isosorbide-hydrALAZINE (BIDIL) 20-37.5 MG tablet [id number] Yes Take 2 tablets by mouth 3 (three) times daily. Bensimhon, Bevelyn Buckles, MD Taking Active   meclizine (ANTIVERT) 25 MG tablet [id number] Yes Take 1 tablet (25 mg total) by mouth 3 (three) times daily as needed for dizziness. Bensimhon, Bevelyn Buckles, MD Taking Active Self, Pharmacy Records           Med Note Ivalee, Desma Paganini May 05, 2023  9:08 AM) More dizziness/vertigo lately  metFORMIN (GLUCOPHAGE) 1000 MG tablet [id number] Yes Take 1 tablet (1,000 mg total) by mouth 2 (two) times daily with a meal. Bensimhon, Bevelyn Buckles, MD Taking Active   pantoprazole (PROTONIX) 40 MG tablet [id number] Yes Take 1 tablet (40 mg total) by mouth once daily. Bensimhon, Bevelyn Buckles, MD Taking Active Self, Pharmacy Records  rosuvastatin (CRESTOR) 10 MG tablet [id number] Yes Take 1 tablet (10 mg total) by mouth daily. Bensimhon, Bevelyn Buckles, MD Taking Active   sacubitril-valsartan (ENTRESTO) 97-103 MG [id number] Yes Take 1 tablet by mouth 2 (two) times daily. Bensimhon, Bevelyn Buckles, MD Taking Active Self, Pharmacy Records  sertraline (ZOLOFT) 100 MG tablet [id number] Yes Take 1.5 tablets (150 mg total) by mouth daily. Bensimhon, Bevelyn Buckles, MD Taking Active Self, Pharmacy Records  spironolactone (ALDACTONE) 25 MG tablet [id number] Yes Take 2 tablets (50 mg total) by mouth daily. Bensimhon, Bevelyn Buckles, MD Taking Active   traMADol HCl 100 MG TABS [id number] Yes Take 1 tablet (100 mg) by mouth every 6 (six) hours as needed for pain Bensimhon, Bevelyn Buckles, MD Taking Active   traZODone  (DESYREL) 100 MG tablet [id number] Yes Take 1 tablet (100 mg total) by mouth at bedtime. Bensimhon, Bevelyn Buckles, MD Taking Active Self, Pharmacy Records  warfarin (COUMADIN) 4 MG tablet [id number] Yes Take 2 tablet (8 mg) daily or as directed by HF clinic  Patient taking differently: Take 8 mg by mouth daily at 4 PM. Take 3 tablets (12 mg) on Tuesdays, and 2 tablet (8 mg) all other days, or as directed by HF clinic   Bensimhon, Bevelyn Buckles, MD Taking Active            Med Note Lamar Benes, [place]   Fri Dec 11, 2022 11:56 AM)                Assessment/Plan:   Diabetes: - Currently controlled per last A1c but with access concerns - Reviewed Ozempic, including mechanism of action, side effects. No personal history of pancreatitis or gallbladder disease, no personal or family history of medullary thyroid cancer known.  - Once patient is re-enrolled in Medicaid, he will let me know and we can re-titrate Ozempic.  -  Will also notify Dr. Gala Romney about [place] Pepper.   Hyperlipidemia/ASCVD Risk Reduction: - Currently controlled.  - Recommend to continue current regimen at this time  Heart Failure: - Currently appropriately managed, but potential opportunity to restart Jardiance.  - Recommend to continue current regimen at this time. Will discuss Jardiance with cardiology.    Follow Up Plan: patient to contact me when update on Medicaid  Catie TClearance Coots, PharmD, BCACP, CPP Clinical Pharmacist [facility] [phone]

## 2023-05-10 ENCOUNTER — Other Ambulatory Visit (HOSPITAL_COMMUNITY): Payer: Self-pay | Admitting: Internal Medicine

## 2023-05-10 DIAGNOSIS — M1A9XX0 Chronic gout, unspecified, without tophus (tophi): Secondary | ICD-10-CM

## 2023-05-10 DIAGNOSIS — I5022 Chronic systolic (congestive) heart failure: Secondary | ICD-10-CM

## 2023-05-10 DIAGNOSIS — I152 Hypertension secondary to endocrine disorders: Secondary | ICD-10-CM

## 2023-05-10 DIAGNOSIS — Z95811 Presence of heart assist device: Secondary | ICD-10-CM

## 2023-05-11 ENCOUNTER — Other Ambulatory Visit (HOSPITAL_COMMUNITY): Payer: Self-pay

## 2023-05-11 ENCOUNTER — Other Ambulatory Visit: Payer: Self-pay

## 2023-05-11 MED ORDER — COLCHICINE 0.6 MG PO TABS
0.6000 mg | ORAL_TABLET | Freq: Every day | ORAL | 6 refills | Status: DC
Start: 2023-05-11 — End: 2024-02-16
  Filled 2023-05-11 – 2023-06-02 (×2): qty 30, 30d supply, fill #0
  Filled 2023-07-02: qty 30, 30d supply, fill #1
  Filled 2023-08-03: qty 30, 30d supply, fill #2
  Filled 2023-08-03: qty 30, 30d supply, fill #0
  Filled 2023-10-01 – 2023-10-14 (×2): qty 30, 30d supply, fill #1
  Filled 2023-11-11: qty 30, 30d supply, fill #2
  Filled 2023-12-18: qty 30, 30d supply, fill #3
  Filled 2024-01-19: qty 30, 30d supply, fill #4

## 2023-05-11 MED ORDER — SERTRALINE HCL 100 MG PO TABS
150.0000 mg | ORAL_TABLET | Freq: Every day | ORAL | 6 refills | Status: DC
Start: 2023-05-11 — End: 2024-04-19
  Filled 2023-05-11: qty 45, 30d supply, fill #0
  Filled 2023-06-02: qty 45, 30d supply, fill #1
  Filled 2023-07-02: qty 45, 30d supply, fill #2
  Filled 2023-08-26: qty 45, 30d supply, fill #3
  Filled 2023-10-25 – 2024-01-19 (×2): qty 45, 30d supply, fill #4
  Filled 2024-02-16: qty 45, 30d supply, fill #5
  Filled 2024-03-17: qty 45, 30d supply, fill #6

## 2023-05-11 MED ORDER — GABAPENTIN 400 MG PO CAPS
400.0000 mg | ORAL_CAPSULE | Freq: Three times a day (TID) | ORAL | 6 refills | Status: DC
Start: 2023-05-11 — End: 2023-11-11
  Filled 2023-05-11: qty 30, 10d supply, fill #0
  Filled 2023-06-02: qty 30, 10d supply, fill #1
  Filled 2023-07-02: qty 30, 10d supply, fill #2
  Filled 2023-08-03: qty 30, 10d supply, fill #3
  Filled 2023-08-03: qty 30, 10d supply, fill #0
  Filled 2023-08-26: qty 30, 10d supply, fill #1
  Filled 2023-09-17: qty 30, 10d supply, fill #2
  Filled 2023-10-26: qty 30, 10d supply, fill #3

## 2023-05-12 ENCOUNTER — Other Ambulatory Visit: Payer: Self-pay

## 2023-05-13 ENCOUNTER — Other Ambulatory Visit (HOSPITAL_COMMUNITY): Payer: Self-pay

## 2023-05-13 DIAGNOSIS — Z7901 Long term (current) use of anticoagulants: Secondary | ICD-10-CM

## 2023-05-13 DIAGNOSIS — Z95811 Presence of heart assist device: Secondary | ICD-10-CM

## 2023-05-18 ENCOUNTER — Ambulatory Visit (HOSPITAL_COMMUNITY): Payer: Self-pay | Admitting: Pharmacist

## 2023-05-18 ENCOUNTER — Ambulatory Visit (HOSPITAL_COMMUNITY)
Admission: RE | Admit: 2023-05-18 | Discharge: 2023-05-18 | Disposition: A | Discharge status: Home / Self Care | Payer: Self-pay | Source: Ambulatory Visit | Attending: Cardiology | Admitting: Cardiology

## 2023-05-18 DIAGNOSIS — Z95811 Presence of heart assist device: Secondary | ICD-10-CM | POA: Insufficient documentation

## 2023-05-18 DIAGNOSIS — Z7901 Long term (current) use of anticoagulants: Secondary | ICD-10-CM | POA: Insufficient documentation

## 2023-05-18 DIAGNOSIS — Z4509 Encounter for adjustment and management of other cardiac device: Secondary | ICD-10-CM | POA: Insufficient documentation

## 2023-05-18 LAB — PROTIME-INR
INR: 2.1 — ABNORMAL HIGH (ref 0.8–1.2)
Prothrombin Time: 23.9 seconds — ABNORMAL HIGH (ref 11.4–15.2)

## 2023-05-31 ENCOUNTER — Other Ambulatory Visit (HOSPITAL_COMMUNITY): Payer: Self-pay | Admitting: *Deleted

## 2023-05-31 DIAGNOSIS — Z95811 Presence of heart assist device: Secondary | ICD-10-CM

## 2023-05-31 DIAGNOSIS — Z7901 Long term (current) use of anticoagulants: Secondary | ICD-10-CM

## 2023-06-01 ENCOUNTER — Ambulatory Visit (HOSPITAL_COMMUNITY): Payer: Self-pay | Admitting: Pharmacist

## 2023-06-01 ENCOUNTER — Ambulatory Visit (HOSPITAL_COMMUNITY)
Admission: RE | Admit: 2023-06-01 | Discharge: 2023-06-01 | Disposition: A | Discharge status: Home / Self Care | Payer: Self-pay | Source: Ambulatory Visit | Attending: Internal Medicine | Admitting: Internal Medicine

## 2023-06-01 DIAGNOSIS — Z7901 Long term (current) use of anticoagulants: Secondary | ICD-10-CM | POA: Insufficient documentation

## 2023-06-01 DIAGNOSIS — Z95811 Presence of heart assist device: Secondary | ICD-10-CM | POA: Insufficient documentation

## 2023-06-01 DIAGNOSIS — Z4509 Encounter for adjustment and management of other cardiac device: Secondary | ICD-10-CM | POA: Insufficient documentation

## 2023-06-01 LAB — PROTIME-INR
INR: 2.8 — ABNORMAL HIGH (ref 0.8–1.2)
Prothrombin Time: 30.1 seconds — ABNORMAL HIGH (ref 11.4–15.2)

## 2023-06-02 ENCOUNTER — Ambulatory Visit (INDEPENDENT_AMBULATORY_CARE_PROVIDER_SITE_OTHER): Payer: Medicaid Other

## 2023-06-02 ENCOUNTER — Other Ambulatory Visit: Payer: Self-pay

## 2023-06-02 DIAGNOSIS — I420 Dilated cardiomyopathy: Secondary | ICD-10-CM

## 2023-06-02 DIAGNOSIS — I5022 Chronic systolic (congestive) heart failure: Secondary | ICD-10-CM

## 2023-06-03 LAB — CUP PACEART REMOTE DEVICE CHECK
Battery Remaining Percentage: 77 %
Date Time Interrogation Session: 20240829123800
HighPow Impedance: 60 Ohm
Implantable Lead Connection Status: 753985
Implantable Lead Implant Date: 20160812
Implantable Lead Location: 753862
Implantable Lead Model: 3401
Implantable Pulse Generator Implant Date: 20210812
Pulse Gen Serial Number: 139514

## 2023-06-03 LAB — CUP PACEART MISC DEVICE CHECK
Date Time Interrogation Session: 20240829125043
Implantable Lead Connection Status: 753985
Implantable Lead Implant Date: 20160812
Implantable Lead Location: 753862
Implantable Lead Model: 3401
Implantable Pulse Generator Implant Date: 20210812
Pulse Gen Serial Number: 139514

## 2023-06-04 ENCOUNTER — Other Ambulatory Visit: Payer: Self-pay

## 2023-06-10 NOTE — Progress Notes (Signed)
Remote ICD transmission.   

## 2023-06-15 ENCOUNTER — Other Ambulatory Visit (HOSPITAL_COMMUNITY): Payer: Self-pay | Admitting: *Deleted

## 2023-06-15 DIAGNOSIS — Z95811 Presence of heart assist device: Secondary | ICD-10-CM

## 2023-06-15 DIAGNOSIS — I5022 Chronic systolic (congestive) heart failure: Secondary | ICD-10-CM

## 2023-06-15 DIAGNOSIS — Z79899 Other long term (current) drug therapy: Secondary | ICD-10-CM

## 2023-06-15 DIAGNOSIS — Z7901 Long term (current) use of anticoagulants: Secondary | ICD-10-CM

## 2023-06-17 ENCOUNTER — Other Ambulatory Visit: Payer: Self-pay

## 2023-06-17 ENCOUNTER — Ambulatory Visit (HOSPITAL_COMMUNITY)
Admission: RE | Admit: 2023-06-17 | Discharge: 2023-06-17 | Disposition: A | Discharge status: Home / Self Care | Payer: Self-pay | Source: Ambulatory Visit | Attending: Internal Medicine | Admitting: Internal Medicine

## 2023-06-17 ENCOUNTER — Ambulatory Visit (HOSPITAL_COMMUNITY): Payer: Self-pay | Admitting: Pharmacist

## 2023-06-17 DIAGNOSIS — I5022 Chronic systolic (congestive) heart failure: Secondary | ICD-10-CM

## 2023-06-17 DIAGNOSIS — T827XXA Infection and inflammatory reaction due to other cardiac and vascular devices, implants and grafts, initial encounter: Secondary | ICD-10-CM

## 2023-06-17 DIAGNOSIS — Z95811 Presence of heart assist device: Secondary | ICD-10-CM

## 2023-06-17 DIAGNOSIS — I10 Essential (primary) hypertension: Secondary | ICD-10-CM

## 2023-06-17 DIAGNOSIS — Z79899 Other long term (current) drug therapy: Secondary | ICD-10-CM

## 2023-06-17 DIAGNOSIS — Z7901 Long term (current) use of anticoagulants: Secondary | ICD-10-CM

## 2023-06-17 LAB — COMPREHENSIVE METABOLIC PANEL
ALT: 26 U/L (ref 0–44)
AST: 27 U/L (ref 15–41)
Albumin: 3.6 g/dL (ref 3.5–5.0)
Alkaline Phosphatase: 49 U/L (ref 38–126)
Anion gap: 8 (ref 5–15)
BUN: 24 mg/dL — ABNORMAL HIGH (ref 6–20)
CO2: 20 mmol/L — ABNORMAL LOW (ref 22–32)
Calcium: 9.5 mg/dL (ref 8.9–10.3)
Chloride: 109 mmol/L (ref 98–111)
Creatinine, Ser: 1.51 mg/dL — ABNORMAL HIGH (ref 0.61–1.24)
GFR, Estimated: 57 mL/min — ABNORMAL LOW (ref >60)
Glucose, Bld: 134 mg/dL — ABNORMAL HIGH (ref 70–99)
Potassium: 3.9 mmol/L (ref 3.5–5.1)
Sodium: 137 mmol/L (ref 135–145)
Total Bilirubin: 0.3 mg/dL (ref 0.3–1.2)
Total Protein: 6.8 g/dL (ref 6.5–8.1)

## 2023-06-17 LAB — TSH: TSH: 3.115 u[IU]/mL (ref 0.350–4.500)

## 2023-06-17 LAB — CBC
HCT: 34.9 % — ABNORMAL LOW (ref 39.0–52.0)
Hemoglobin: 10.8 g/dL — ABNORMAL LOW (ref 13.0–17.0)
MCH: 23.5 pg — ABNORMAL LOW (ref 26.0–34.0)
MCHC: 30.9 g/dL (ref 30.0–36.0)
MCV: 75.9 fL — ABNORMAL LOW (ref 80.0–100.0)
Platelets: 253 10*3/uL (ref 150–400)
RBC: 4.6 MIL/uL (ref 4.22–5.81)
RDW: 17.2 % — ABNORMAL HIGH (ref 11.5–15.5)
WBC: 5.7 10*3/uL (ref 4.0–10.5)
nRBC: 0 % (ref 0.0–0.2)

## 2023-06-17 LAB — PROTIME-INR
INR: 2.4 — ABNORMAL HIGH (ref 0.8–1.2)
Prothrombin Time: 26.3 s — ABNORMAL HIGH (ref 11.4–15.2)

## 2023-06-17 LAB — LACTATE DEHYDROGENASE: LDH: 161 U/L (ref 98–192)

## 2023-06-17 LAB — PREALBUMIN: Prealbumin: 29 mg/dL (ref 18–38)

## 2023-06-17 LAB — T4, FREE: Free T4: 0.87 ng/dL (ref 0.61–1.12)

## 2023-06-17 MED ORDER — SPIRONOLACTONE 25 MG PO TABS
25.0000 mg | ORAL_TABLET | Freq: Every day | ORAL | 6 refills | Status: DC
Start: 2023-06-17 — End: 2024-07-08
  Filled 2023-06-17: qty 90, 90d supply, fill #0
  Filled 2023-10-12: qty 90, 90d supply, fill #1
  Filled 2024-01-19: qty 90, 90d supply, fill #2
  Filled 2024-04-19 – 2024-04-21 (×2): qty 90, 90d supply, fill #3

## 2023-06-17 MED ORDER — CEFADROXIL 500 MG PO CAPS
500.0000 mg | ORAL_CAPSULE | Freq: Two times a day (BID) | ORAL | 3 refills | Status: AC
Start: 2023-06-17 — End: ?
  Filled 2023-06-17: qty 60, 30d supply, fill #0
  Filled 2023-08-03 – 2023-08-04 (×3): qty 60, 30d supply, fill #1
  Filled 2023-09-07 – 2023-09-17 (×2): qty 60, 30d supply, fill #2
  Filled 2023-10-25: qty 60, 30d supply, fill #3
  Filled 2023-12-03: qty 60, 30d supply, fill #4
  Filled 2024-01-13: qty 60, 30d supply, fill #5
  Filled 2024-02-16: qty 60, 30d supply, fill #6
  Filled 2024-03-17: qty 60, 30d supply, fill #7
  Filled 2024-04-19 – 2024-04-21 (×2): qty 60, 30d supply, fill #8

## 2023-06-17 NOTE — Patient Instructions (Signed)
Decrease Spironolactone to 25 mg daily (1 tablet) Coumadin dosing per Lauren-pharm D Return to clinic in 2 months

## 2023-06-17 NOTE — Progress Notes (Addendum)
Patient presents for 2 mo f/u with 2 yr. intermacs and annual maintenance in VAD Clinic today alone. Reports no problem with VAD or equipment.  Pt states he is overall doing well. Denies falls, shortness of breath, heart failure symptoms and signs of bleeding. Pt tells me that he has been dizzy today since getting up. Pt states that he feels "woozy" in the mornings after he takes his medications. We will decrease Cleda Daub to 25 mg daily per Dr Gala Romney.  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   He has not taken Ozempic for the last month due to no insurance. He tells me that he has "sent everything in to Medicaid and waiting to hear back."  Pt reports his wife is doing weekly dressing changes. Pt states that some days he walks and some days he doesn't.  Pt has a decrease in his hgb from 13.1 to 10.3 today. We will repeat cbc on Monday.   Vital Signs:  Doppler Pressure: 90 Automatc BP: 87/65 (74) HR: 81 SPO2: 94% on RA   Weight: 296 lb w/ eqt Last weight:  295.4 lbs w/eqt   VAD Indication: Destination Therapy due to smoking and BMI   VAD interrogation & Equipment Management: Speed: 6200 Flow: 5 Power: 5.1 w    PI: 3.3 Hct: 38   Alarms: few LV alarms Events: 3-10 daily  Fixed speed 6200 Low speed limit: 5900   Primary Controller:  Replace back up battery in 7 months. Back up controller:   Replace back up battery in 10 months.   Annual Equipment Maintenance on UBC/PM was performed on 06/17/23.    I reviewed the LVAD parameters from today and compared the results to the patient's prior recorded data. LVAD interrogation was NEGATIVE for significant power changes, NEGATIVE for clinical alarms and STABLE for PI events/speed drops. No programming changes were made and pump is functioning within specified parameters. Pt is performing daily controller and system monitor self tests along with completing weekly and monthly maintenance  for LVAD equipment.   LVAD equipment check completed and is in good working order. Back-up equipment not present.    Exit Site Care: Drive line being maintained daily by pt's wife. Gauze dressing dry and intact; anchor in place and accurately applied. Pt denies fever or chills. Continue daily dressing changes due to excessive sweating during summer with activities. Clean with VASHE solution, with VASHE moistened 2 x 2 at exit site. Provided pt with 8 weekly kits for home use. States he has enough VASHE solution at home.    Device:  Left subcutaneous dual ICD Therapies: on at 250 BPM Last check: 03/04/22  BP & Labs:  Doppler 90 - Doppler is reflecting MAP   Hgb 10.8 - No S/S of bleeding. Specifically denies melena/BRBPR or nosebleeds.   LDH stable at 161 with established baseline of 160 - 300. Denies tea-colored urine. No power elevations noted on interrogation.   [place] Cardiomyopathy Questionnaire     06/17/2023    3:08 PM 02/09/2023    2:38 PM 08/05/2022   11:46 AM  KCCQ-12  1 a. Ability to shower/bathe Slightly limited Slightly limited Moderately limited  1 b. Ability to walk 1 block Slightly limited Slightly limited Moderately limited  1 c. Ability to hurry/jog Quite a bit limited Extremely limited Extremely limited  2. Edema feet/ankles/legs Never over the past 2 weeks Less than once a week 1-2 times a week  3. Limited by  fatigue 1-2 times a week At least once a day 3+ times per week, not every day  4. Limited by dyspnea 1-2 times a week At least once a day 3+ times a week, not every day  5. Sitting up / on 3+ pillows Never over the past 2 weeks Never over the past 2 weeks Never over the past 2 weeks  6. Limited enjoyment of life Slightly limited Slightly limited Moderately limited  7. Rest of life w/ symptoms Completely satisfied Mostly satisfied Somewhat satisfied  8 a. Participation in hobbies Slightly limited Limited quite a bit Moderately limited  8 b. Participation in  chores Moderately limited Limited quite a bit Limited quite a bit  8 c. Visiting family/friends Slightly limited Limited quite a bit Limited quite a bit     Batteries Manufacture Date: Number of uses: Re-calibration  07/16/21 141 Needs to be calibrated  07/16/21 112 Needs to be calibrated   Annual maintenance completed per Biomed on patient's home power module and universal Magazine features editor.  Pt educated on calibrating batteries today.  Backup system controller 11 volt battery charged during visit.   2 year Intermacs follow up completed including:  Quality of Life, KCCQ-12, and Neurocognitive trail making.   Pt unable to complete 6 minute walk due to dizziness.    Patient Goals: Start to walk again.    Plan:  Decrease Spironolactone to 25 mg daily (1 tablet) Coumadin dosing per [facility] D Return to clinic on Monday for CBC, anemia panel Return to clinic in 2 months  Carlton Adam RN VAD Coordinator  Office: [phone]  24/7 Pager: [phone]

## 2023-06-18 ENCOUNTER — Telehealth (HOSPITAL_COMMUNITY): Payer: Self-pay | Admitting: Licensed Clinical Social Worker

## 2023-06-18 ENCOUNTER — Ambulatory Visit: Payer: Self-pay | Admitting: Nurse Practitioner

## 2023-06-18 ENCOUNTER — Other Ambulatory Visit: Payer: Self-pay

## 2023-06-18 ENCOUNTER — Other Ambulatory Visit (HOSPITAL_COMMUNITY): Payer: Self-pay | Admitting: *Deleted

## 2023-06-18 DIAGNOSIS — I5022 Chronic systolic (congestive) heart failure: Secondary | ICD-10-CM

## 2023-06-18 DIAGNOSIS — Z7901 Long term (current) use of anticoagulants: Secondary | ICD-10-CM

## 2023-06-18 DIAGNOSIS — Z95811 Presence of heart assist device: Secondary | ICD-10-CM

## 2023-06-18 LAB — T3, FREE: T3, Free: 2.4 pg/mL (ref 2.0–4.4)

## 2023-06-18 NOTE — Telephone Encounter (Signed)
CSW contacted patient to remind of upcoming VADiversary next Thursday June 24, 2023 at 5pm. [name], [name], CCSW-MCS [phone]

## 2023-06-21 ENCOUNTER — Ambulatory Visit (HOSPITAL_COMMUNITY)
Admission: RE | Admit: 2023-06-21 | Discharge: 2023-06-21 | Disposition: A | Discharge status: Home / Self Care | Payer: Self-pay | Source: Ambulatory Visit | Attending: Internal Medicine | Admitting: Internal Medicine

## 2023-06-21 DIAGNOSIS — I5022 Chronic systolic (congestive) heart failure: Secondary | ICD-10-CM | POA: Insufficient documentation

## 2023-06-21 DIAGNOSIS — Z95811 Presence of heart assist device: Secondary | ICD-10-CM | POA: Insufficient documentation

## 2023-06-21 LAB — FERRITIN: Ferritin: 21 ng/mL — ABNORMAL LOW (ref 24–336)

## 2023-06-21 LAB — CBC
HCT: 37.3 % — ABNORMAL LOW (ref 39.0–52.0)
Hemoglobin: 11.5 g/dL — ABNORMAL LOW (ref 13.0–17.0)
MCH: 23.5 pg — ABNORMAL LOW (ref 26.0–34.0)
MCHC: 30.8 g/dL (ref 30.0–36.0)
MCV: 76.1 fL — ABNORMAL LOW (ref 80.0–100.0)
Platelets: 231 10*3/uL (ref 150–400)
RBC: 4.9 MIL/uL (ref 4.22–5.81)
RDW: 16.9 % — ABNORMAL HIGH (ref 11.5–15.5)
WBC: 7.5 10*3/uL (ref 4.0–10.5)
nRBC: 0 % (ref 0.0–0.2)

## 2023-06-21 LAB — IRON AND TIBC
Iron: 29 ug/dL — ABNORMAL LOW (ref 45–182)
Saturation Ratios: 6 % — ABNORMAL LOW (ref 17.9–39.5)
TIBC: 476 ug/dL — ABNORMAL HIGH (ref 250–450)
UIBC: 447 ug/dL

## 2023-06-21 LAB — VITAMIN B12: Vitamin B-12: 173 pg/mL — ABNORMAL LOW (ref 180–914)

## 2023-06-21 LAB — FOLATE: Folate: 10.7 ng/mL (ref >5.9)

## 2023-06-22 ENCOUNTER — Telehealth (HOSPITAL_COMMUNITY): Payer: Self-pay | Admitting: Unknown Physician Specialty

## 2023-06-22 NOTE — Telephone Encounter (Signed)
Pt called the office inquiring about his labs from yesterday. Pt was informed that his hgb has come up slightly and that he will be ok to leave for [place] on Thursday. Pt informed that he would benefit from IV Iron but these infusions have to have a PA through insurance and the pt is currently uninsured. Pt was instructed that he could start a PO iron supplement if he would like. Pt tells me that he is hoping his medicaid will be active soon. We can work on PA and getting pt scheduled for IV Iron after that time.  Carlton Adam RN, BSN VAD Coordinator 24/7 Pager [phone]

## 2023-06-30 ENCOUNTER — Other Ambulatory Visit (HOSPITAL_COMMUNITY): Payer: Self-pay

## 2023-06-30 ENCOUNTER — Encounter (HOSPITAL_COMMUNITY): Payer: Self-pay

## 2023-06-30 ENCOUNTER — Other Ambulatory Visit (HOSPITAL_COMMUNITY): Payer: Self-pay | Admitting: *Deleted

## 2023-06-30 ENCOUNTER — Ambulatory Visit: Payer: Self-pay | Admitting: Nurse Practitioner

## 2023-06-30 ENCOUNTER — Other Ambulatory Visit: Payer: Self-pay

## 2023-06-30 ENCOUNTER — Other Ambulatory Visit: Payer: Self-pay | Admitting: Obstetrics and Gynecology

## 2023-06-30 ENCOUNTER — Ambulatory Visit (HOSPITAL_COMMUNITY): Payer: Self-pay | Admitting: Pharmacist

## 2023-06-30 ENCOUNTER — Encounter: Payer: Self-pay | Admitting: Nurse Practitioner

## 2023-06-30 ENCOUNTER — Ambulatory Visit (HOSPITAL_COMMUNITY)
Admission: RE | Admit: 2023-06-30 | Discharge: 2023-06-30 | Disposition: A | Discharge status: Home / Self Care | Payer: Self-pay | Source: Ambulatory Visit | Attending: Cardiology | Admitting: Cardiology

## 2023-06-30 ENCOUNTER — Ambulatory Visit (INDEPENDENT_AMBULATORY_CARE_PROVIDER_SITE_OTHER): Payer: Self-pay | Admitting: Nurse Practitioner

## 2023-06-30 VITALS — BP 88/62 | HR 74 | Temp 98.7°F | Resp 14 | Ht 67.0 in | Wt 290.0 lb

## 2023-06-30 DIAGNOSIS — Z95811 Presence of heart assist device: Secondary | ICD-10-CM

## 2023-06-30 DIAGNOSIS — I5022 Chronic systolic (congestive) heart failure: Secondary | ICD-10-CM

## 2023-06-30 DIAGNOSIS — Z7901 Long term (current) use of anticoagulants: Secondary | ICD-10-CM

## 2023-06-30 DIAGNOSIS — K921 Melena: Secondary | ICD-10-CM

## 2023-06-30 DIAGNOSIS — E1122 Type 2 diabetes mellitus with diabetic chronic kidney disease: Secondary | ICD-10-CM

## 2023-06-30 DIAGNOSIS — Z794 Long term (current) use of insulin: Secondary | ICD-10-CM

## 2023-06-30 DIAGNOSIS — I10 Essential (primary) hypertension: Secondary | ICD-10-CM

## 2023-06-30 DIAGNOSIS — N1831 Chronic kidney disease, stage 3a: Secondary | ICD-10-CM

## 2023-06-30 DIAGNOSIS — D649 Anemia, unspecified: Secondary | ICD-10-CM

## 2023-06-30 LAB — BASIC METABOLIC PANEL
Anion gap: 8 (ref 5–15)
BUN: 22 mg/dL — ABNORMAL HIGH (ref 6–20)
CO2: 20 mmol/L — ABNORMAL LOW (ref 22–32)
Calcium: 9.6 mg/dL (ref 8.9–10.3)
Chloride: 109 mmol/L (ref 98–111)
Creatinine, Ser: 1.53 mg/dL — ABNORMAL HIGH (ref 0.61–1.24)
GFR, Estimated: 56 mL/min — ABNORMAL LOW (ref >60)
Glucose, Bld: 91 mg/dL (ref 70–99)
Potassium: 4.4 mmol/L (ref 3.5–5.1)
Sodium: 137 mmol/L (ref 135–145)

## 2023-06-30 LAB — PROTIME-INR
INR: 1.9 — ABNORMAL HIGH (ref 0.8–1.2)
Prothrombin Time: 21.7 seconds — ABNORMAL HIGH (ref 11.4–15.2)

## 2023-06-30 LAB — CBC
HCT: 35.2 % — ABNORMAL LOW (ref 39.0–52.0)
Hemoglobin: 11.3 g/dL — ABNORMAL LOW (ref 13.0–17.0)
MCH: 24.2 pg — ABNORMAL LOW (ref 26.0–34.0)
MCHC: 32.1 g/dL (ref 30.0–36.0)
MCV: 75.5 fL — ABNORMAL LOW (ref 80.0–100.0)
Platelets: 255 10*3/uL (ref 150–400)
RBC: 4.66 MIL/uL (ref 4.22–5.81)
RDW: 16.9 % — ABNORMAL HIGH (ref 11.5–15.5)
WBC: 7.1 10*3/uL (ref 4.0–10.5)
nRBC: 0 % (ref 0.0–0.2)

## 2023-06-30 LAB — POCT GLYCOSYLATED HEMOGLOBIN (HGB A1C): HbA1c, POC (prediabetic range): 5.7 % (ref 5.7–6.4)

## 2023-06-30 LAB — LACTATE DEHYDROGENASE: LDH: 179 U/L (ref 98–192)

## 2023-06-30 MED ORDER — VITAMIN B-12 1000 MCG PO TABS
1000.0000 ug | ORAL_TABLET | Freq: Every day | ORAL | 0 refills | Status: AC
Start: 2023-06-30 — End: ?
  Filled 2023-06-30 – 2023-11-11 (×2): qty 30, 30d supply, fill #0

## 2023-06-30 MED ORDER — FOLIC ACID 800 MCG PO TABS
400.0000 ug | ORAL_TABLET | Freq: Every day | ORAL | 2 refills | Status: AC
  Filled 2023-06-30 – 2024-02-16 (×2): qty 30, 60d supply, fill #0
  Filled 2024-04-19: qty 30, 60d supply, fill #1

## 2023-06-30 MED ORDER — AMLODIPINE BESYLATE 2.5 MG PO TABS
2.5000 mg | ORAL_TABLET | Freq: Every day | ORAL | 0 refills | Status: DC
Start: 2023-06-30 — End: 2023-07-01
  Filled 2023-06-30: qty 90, 90d supply, fill #0

## 2023-06-30 MED ORDER — BLOOD PRESSURE KIT
1.0000 [IU] | PACK | Freq: Every day | 0 refills | Status: AC
Start: 2023-06-30 — End: ?
  Filled 2023-06-30: qty 1, fill #0

## 2023-06-30 MED ORDER — FERROUS SULFATE 325 (65 FE) MG PO TABS
325.0000 mg | ORAL_TABLET | Freq: Every day | ORAL | 1 refills | Status: DC
Start: 2023-06-30 — End: 2024-01-19
  Filled 2023-06-30: qty 90, 90d supply, fill #0
  Filled 2023-10-25: qty 90, 90d supply, fill #1

## 2023-06-30 NOTE — Patient Instructions (Signed)
1. Type 2 diabetes mellitus with stage 3a chronic kidney disease, with long-term current use of insulin (HCC)  - POCT glycosylated hemoglobin (Hb A1C)  2. Essential hypertension  - AMB Referral to Pharmacy Medication Management - amLODipine (NORVASC) 2.5 MG tablet; Take 1 tablet (2.5 mg total) by mouth daily.  Dispense: 90 tablet; Refill: 0 - Blood Pressure KIT; 1 Units by Does not apply route daily.  Dispense: 1 kit; Refill: 0  3. Anemia, unspecified type  - cyanocobalamin (VITAMIN B12) 1000 MCG tablet; Take 1 tablet (1,000 mcg total) by mouth daily.  Dispense: 30 tablet; Refill: 0 - folic acid (FOLVITE) 800 MCG tablet; Take 0.5 tablets (400 mcg total) by mouth daily.  Dispense: 30 tablet; Refill: 2 - ferrous sulfate 325 (65 FE) MG tablet; Take 1 tablet (325 mg total) by mouth daily before breakfast.  Dispense: 90 tablet; Refill: 1  4. Blood in stool  - Ambulatory referral to Gastroenterology  Follow up:  Follow up in 3 months

## 2023-06-30 NOTE — Patient Instructions (Signed)
Hi Jeffrey [name], thank you for the updates today-have a great day!  Jeffrey [name] was given information about Medicaid Managed Care team care coordination services and verbally consented to engagement with the [facility] Managed Care team.   Jeffrey [name] - following are the goals we discussed in your visit today:   Goals Addressed             This Visit's Progress    Protect My Health       Timeframe:  Long-Range Goal Priority:  High Start Date:    09/24/20                         Expected End Date:  ongoing           Follow up date: 08/30/23 - schedule recommended health tests  - schedule and keep appointment for annual check-up   06/30/23: PCP appt today.  LVAD Clinic 11/18   Patient verbalizes understanding of instructions and care plan provided today and agrees to view in MyChart. Active MyChart status and patient understanding of how to access instructions and care plan via MyChart confirmed with patient.     The Managed Medicaid care management team will reach out to the patient again over the next 60 business  days.  The  Patient                                              has been provided with contact information for the Managed Medicaid care management team and has been advised to call with any health related questions or concerns.   Following is a copy of your plan of care:  Care Plan : General Plan of Care (Adult)  Updates made by Danie Chandler, RN since 06/30/2023 12:00 AM     Problem: Health Promotion or Disease Self-Management (General Plan of Care)   Priority: High  Onset Date: 01/29/2023  Note:   Current Barriers:  Chronic Disease Management support and education needs, S/P LVAD 07/21/21, DM, HTN, OSA, CHF, Afib, CKD, gout, DDD, HLD, depression,  h/o renal Ca 06/30/23:  Still awaiting Medicaid reimbursement.  BP and BG stable. Dressing changes every other day continue along with suppressive therapy.  Close f/u at LVAD clinic.  Stable on Zoloft.  Nurse Case Manager Clinical  Goal(s):  Over the next 90 days, patient will attend all scheduled medical appointments: Over the next 90 days, patient will have dental appt scheduled  Interventions:  Inter-disciplinary care team collaboration (see longitudinal plan of care) Collaborated with BSW  BSW referral for disability information-completed Evaluation of current treatment plan and patient's adherence to plan as established by provider. .  Reviewed medications with patient. Discussed plans with patient for ongoing care management follow up and provided patient with direct contact information for care management team Provided patient with educational materials related to exercise. Reviewed scheduled/upcoming provider appointments. Collaborated with Pharmacy Pharmacy referral for medication review  AFIB Interventions: (Status:  New goal.) Long Term Goal   Reviewed importance of adherence to anticoagulant exactly as prescribed Counseled on importance of regular laboratory monitoring as prescribed Afib action plan reviewed Assessed social determinant of health barriers  Heart Failure Interventions:  (Status:  New goal.) Long Term Goal Basic overview and discussion of pathophysiology of Heart Failure reviewed Discussed the importance of keeping all appointments with provider Assessed  social determinant of health barriers    Chronic Kidney Disease Interventions:  (Status:  New goal.) Long Term Goal Evaluation of current treatment plan related to chronic kidney disease self management and patient's adherence to plan as established by provider      Reviewed prescribed diet  Reviewed medications with patient and discussed importance of compliance    Reviewed scheduled/upcoming provider appointments  Assessed social determinant of health barriers    Last practice recorded BP readings:  BP Readings from Last 3 Encounters:  12/11/22 (!) 130/0     06/17/23  87/65  01/20/23         142/9  Most recent eGFR/CrCl: No  results found for: "EGFR"  No components found for: "CRCL"  Diabetes Interventions:  (Status:  New goal.) Long Term Goal Assessed patient's understanding of A1c goal: <7% Reviewed medications with patient and discussed importance of medication adherence Counseled on importance of regular laboratory monitoring as prescribed Discussed plans with patient for ongoing care management follow up and provided patient with direct contact information for care management team Reviewed scheduled/upcoming provider appointments  Advised patient, providing education and rationale, to check cbg as directed and record, calling provider  for findings outside established parameters Review of patient status, including review of consultants reports, relevant laboratory and other test results, and medications completed Assessed social determinant of health barriers Lab Results  Component Value Date   HGBA1C 5.3 09/16/2022             HGBA1C                                                      5.9                                    01/20/2023            HGBA1C                                                      5.5                                    03/18/2023  Hyperlipidemia Interventions:  (Status:  New goal.) Long Term Goal Medication review performed; medication list updated in electronic medical record.  Provider established cholesterol goals reviewed Counseled on importance of regular laboratory monitoring as prescribed Reviewed importance of limiting foods high in cholesterol Assessed social determinant of health barriers   Hypertension Interventions:  (Status:  New goal.) Long Term Goal Last practice recorded BP readings:  BP Readings from Last 3 Encounters:  12/11/22 (!) 130/0  06/17/23  87/65     03/18/23         108/84  Most recent eGFR/CrCl: No results found for: "EGFR"  No components found for: "CRCL"  Evaluation of current treatment plan related to hypertension self management and patient's  adherence to plan as established by provider Reviewed medications with patient and discussed importance of compliance Discussed plans with patient for ongoing care management follow  up and provided patient with direct contact information for care management team Advised patient, providing education and rationale, to monitor blood pressure daily and record, calling PCP for findings outside established parameters Reviewed scheduled/upcoming provider appointments  Assessed social determinant of health barriers   Patient Goals/Self-Care Activities Over the next 90 days, patient will:  -Attends all scheduled provider appointments Calls provider office for new concerns or questionss 06/30/23: Patient will f/u on Medicaid reinstatement.  Follow Up Plan: The Managed Medicaid care management team will reach out to the patient again over the next 60 business  days.  The patient has been provided with contact information for the Managed Medicaid care management team and has been advised to call with any health related questions or concerns.    Kathi Der RN, BSN [place]  [employer], production - Managed Medicaid High Risk [phone]

## 2023-06-30 NOTE — Patient Outreach (Signed)
Medicaid Managed Care   Nurse Care Manager Note  06/30/2023 Name:  Jeffrey [name] MRN:  [id number] DOB:  [birth year]  Jeffrey [name] is an 47 y.o. year old male who is a primary patient of Jeffrey [name].  [facility] Managed Care Coordination team was consulted for assistance with:    Chronic healthcare management needs, CHF, CKD, gout, HTN, DM, h/o renal cancer, OSA, VAD, DLD, HLD, depression  [name] was given information about Medicaid Managed Care Coordination team services today. Jeffrey [name] Patient agreed to services and verbal consent obtained.  Engaged with patient by telephone for follow up visit in response to provider referral for case management and/or care coordination services.   Assessments/Interventions:  Review of past medical history, allergies, medications, health status, including review of consultants reports, laboratory and other test data, was performed as part of comprehensive evaluation and provision of chronic care management services.  SDOH (Social Determinants of Health) assessments and interventions performed: SDOH Interventions    Flowsheet Row Patient Outreach Telephone from 06/30/2023 in [place] POPULATION HEALTH DEPARTMENT Patient Outreach Telephone from 04/29/2023 in [place] POPULATION HEALTH DEPARTMENT Patient Outreach Telephone from 12/29/2022 in [place] POPULATION HEALTH DEPARTMENT Patient Outreach Telephone from 12/08/2022 in [place] POPULATION HEALTH DEPARTMENT Patient Outreach Telephone from 10/30/2022 in [place] POPULATION HEALTH DEPARTMENT Patient Outreach Telephone from 10/12/2022 in [place] POPULATION HEALTH DEPARTMENT  SDOH Interventions        Food Insecurity Interventions -- Intervention Not Indicated -- -- -- --  Housing Interventions Intervention Not Indicated -- -- -- -- --  Transportation Interventions -- -- -- Intervention Not Indicated -- --  Utilities Interventions Intervention Not Indicated -- -- -- -- --  Alcohol  Usage Interventions -- -- Intervention Not Indicated (Score <7) -- -- Intervention Not Indicated (Score <7)  Financial Strain Interventions -- -- -- -- Other (Comment)  [patient working with BSW] --  Physical Activity Interventions -- -- -- Intervention Not Indicated -- --  Stress Interventions -- -- -- -- Other (Comment)  [SW following-concerns over wait for disability and bills due.] --  Health Literacy Interventions -- Intervention Not Indicated -- -- -- --     Care Plan Allergies  Allergen Reactions   Aspirin Shortness Of Breath, Itching and Rash     Burning sensation (Patient reports he tolerates other NSAIDS)    Bee Venom Hives and Swelling   Lisinopril Cough   Tomato Hives    Medications Reviewed Today     Reviewed by Jeffrey Chandler, RN (Registered Nurse) on 06/30/23 at [phone]  Med List Status: <None>   Medication Order Taking? Sig Documenting Provider Last Dose Status Informant  Accu-Chek FastClix Lancets MISC [id number] No USE AS DIRECTED FOUR TIMES DAILY Jeffrey [name] Taking Active   acetaminophen (TYLENOL) 325 MG tablet [id number] No Take 2 tablets (650 mg total) by mouth every 6 (six) hours as needed for fever. Jeffrey [name] Taking Active Self, Pharmacy Records           Med Note [facility]   Thu Mar 18, 2023  2:30 PM) PRN   allopurinol (ZYLOPRIM) 100 MG tablet [id number] No Take 1 tablet (100 mg total) by mouth daily. Jeffrey [name] Taking Active Self, Pharmacy Records  amiodarone (PACERONE) 200 MG tablet [id number] No Take 1 tablet (200 mg total) by mouth daily. Jeffrey [name] Taking Active Self, Pharmacy Records  amLODipine (NORVASC) 10 MG tablet [id number] No Take  1 tablet (10 mg total) by mouth daily Jeffrey [name] Taking Active   cefadroxil (DURICEF) 500 MG capsule [id number]  Take 1 capsule (500 mg total) by mouth 2 (two) times daily. Jeffrey [name]  Active   colchicine 0.6 MG tablet [id number] No Take 1 tablet  (0.6 mg total) by mouth once daily. Jeffrey [name] Taking Active   doxazosin (CARDURA) 2 MG tablet [id number] No Take 1 tablet (2 mg total) by mouth 2 (two) times daily. Jeffrey [name] Taking Active   gabapentin (NEURONTIN) 400 MG capsule [id number] No Take 1 capsule (400 mg total) by mouth 3 (three) times daily. Jeffrey [name] Taking Active   glucose blood test strip [id number] No Use as directed 4 times daily. Jeffrey [name] Taking Active Self, Pharmacy Records  isosorbide-hydrALAZINE (BIDIL) 20-37.5 MG tablet [id number] No Take 2 tablets by mouth 3 (three) times daily. Jeffrey [name] Taking Active   meclizine (ANTIVERT) 25 MG tablet [id number] No Take 1 tablet (25 mg total) by mouth 3 (three) times daily as needed for dizziness. Jeffrey [name] Taking Active Self, Pharmacy Records           Med Note [place], Desma Paganini May 05, 2023  9:08 AM) More dizziness/vertigo lately  metFORMIN (GLUCOPHAGE) 1000 MG tablet [id number] No Take 1 tablet (1,000 mg total) by mouth 2 (two) times daily with a meal. Jeffrey [name] Taking Active   pantoprazole (PROTONIX) 40 MG tablet [id number] No Take 1 tablet (40 mg total) by mouth once daily. Jeffrey [name] Taking Active Self, Pharmacy Records  rosuvastatin (CRESTOR) 10 MG tablet [id number] No Take 1 tablet (10 mg total) by mouth daily. Jeffrey [name] Taking Active   sacubitril-valsartan (ENTRESTO) 97-103 MG [id number] No Take 1 tablet by mouth 2 (two) times daily. Jeffrey [name] Taking Active Self, Pharmacy Records  sertraline (ZOLOFT) 100 MG tablet [id number] No Take 1.5 tablets (150 mg total) by mouth daily. Jeffrey [name] Taking Active   spironolactone (ALDACTONE) 25 MG tablet [id number]  Take 1 tablet (25 mg total) by mouth daily. Jeffrey [name]  Active   traMADol HCl 100 MG TABS [id number] No Take 1 tablet (100 mg) by mouth every 6 (six) hours as needed for pain  Jeffrey [name] Taking Active   traZODone (DESYREL) 100 MG tablet [id number] No Take 1 tablet (100 mg total) by mouth at bedtime. Jeffrey [name] Taking Active Self, Pharmacy Records  warfarin (COUMADIN) 4 MG tablet [id number] No Take 2 tablet (8 mg) daily or as directed by HF clinic  Patient taking differently: Take 8 mg by mouth daily at 4 PM. 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/10/2022   Presence of left ventricular assist device (LVAD) (HCC) 07/25/2021   Acute on chronic systolic CHF (congestive heart failure) (HCC) 07/18/2021   Cardiogenic shock (HCC)    Atrial fibrillation with RVR (HCC) 04/30/2021   Acute  hypoxemic respiratory failure (HCC)    Right wrist pain 04/16/2021   Absolute anemia    Rectal bleeding    Iron deficiency 12/25/2020   Long term current use of anticoagulant - apixaban 12/25/2020   Paroxysmal atrial fibrillation (HCC)    Lactic acidosis 11/22/2020   Hypotension 11/22/2020   Aspiration into airway    ICD (implantable cardioverter-defibrillator) battery depletion 05/15/2020   Endotracheal tube present    Respiratory failure (HCC)    Acute encephalopathy    Acute pulmonary edema (HCC)    NICM (nonischemic cardiomyopathy) (HCC) 04/24/2020   S/P vasectomy 12/20/2019   Anxiety 12/20/2019   Hemoglobin A1C between 7% and 9% indicating borderline diabetic control (HCC) 12/20/2019   Seasonal allergies 12/20/2019   Insomnia 12/20/2019   Vasectomy evaluation 06/20/2019   Visual problems 06/20/2019   Blurry vision, bilateral 06/20/2019   Hyperglycemia 02/13/2019   Hyperosmolar (nonketotic) coma (HCC) 01/19/2019   AICD  (automatic cardioverter/defibrillator) present 01/19/2019   Hyperlipidemia 12/07/2018   Follow-up exam 11/22/2018   Class 3 severe obesity due to excess calories with serious comorbidity and body mass index (BMI) of 45.0 to 49.9 in adult (HCC) 11/22/2018   Dizziness 11/22/2018   Lingular pneumonia 08/04/2018   Ventricular tachycardia (HCC) 12/30/2017   PAF (paroxysmal atrial fibrillation) (HCC)    Dilated cardiomyopathy (HCC)    Pollen allergy 02/17/2017   Dyslipidemia 02/17/2017   Hematochezia 11/08/2015   CKD (chronic kidney disease), stage III (HCC) 06/20/2015   Cardiac defibrillator in situ    Chronic systolic CHF (congestive heart failure) (HCC) 03/11/2015   Gouty arthritis 03/11/2015   Chest pain 12/28/2014   Acute renal failure superimposed on stage 3 chronic kidney disease (HCC) 04/08/2014   Aspirin allergy 04/08/2014   Renal cell carcinoma (HCC)    Gout attack 11/10/2011   Morbid obesity (HCC) 10/23/2011   OSA (obstructive sleep apnea) 10/23/2011   Type 2 diabetes mellitus with stage 3 chronic kidney disease, with long-term current use of insulin (HCC) 10/22/2011   Hyperlipidemia associated with type 2 diabetes mellitus (HCC) 12/25/2010   Hypertension associated with diabetes (HCC) 12/25/2010   Conditions to be addressed/monitored per PCP order:  Chronic healthcare management needs, CHF, CKD, gout, HTN, DM, h/o renal cancer, OSA, VAD, DLD, HLD, depression  Care Plan : General Plan of Care (Adult)  Updates made by Jeffrey Chandler, RN since 06/30/2023 12:00 AM     Problem: Health Promotion or Disease Self-Management (General Plan of Care)   Priority: High  Onset Date: 01/29/2023  Note:   Current Barriers:  Chronic Disease Management support and education needs, S/P LVAD 07/21/21, DM, HTN, OSA, CHF, Afib, CKD, gout, DDD, HLD, depression,  h/o renal Ca 06/30/23:  Still awaiting Medicaid reimbursement.  BP and BG stable. Dressing changes every other day continue along with  suppressive therapy.  Close f/u at LVAD clinic.  Stable on Zoloft.  Nurse Case Manager Clinical Goal(s):  Over the next 90 days, patient will attend all scheduled medical appointments: Over the next 90 days, patient will have dental appt scheduled  Interventions:  Inter-disciplinary care team collaboration (see longitudinal plan of care) Collaborated with BSW  BSW referral for disability information-completed Evaluation of current treatment plan and patient's adherence to plan as established by provider. .  Reviewed medications with patient. Discussed plans with patient for ongoing care management follow up and provided patient with direct contact information for care management team Provided patient with educational materials related to exercise. Reviewed scheduled/upcoming provider appointments. Collaborated  with Pharmacy Pharmacy referral for medication review  AFIB Interventions: (Status:  New goal.) Long Term Goal   Reviewed importance of adherence to anticoagulant exactly as prescribed Counseled on importance of regular laboratory monitoring as prescribed Afib action plan reviewed Assessed social determinant of health barriers  Heart Failure Interventions:  (Status:  New goal.) Long Term Goal Basic overview and discussion of pathophysiology of Heart Failure reviewed Discussed the importance of keeping all appointments with provider Assessed social determinant of health barriers    Chronic Kidney Disease Interventions:  (Status:  New goal.) Long Term Goal Evaluation of current treatment plan related to chronic kidney disease self management and patient's adherence to plan as established by provider      Reviewed prescribed diet  Reviewed medications with patient and discussed importance of compliance    Reviewed scheduled/upcoming provider appointments  Assessed social determinant of health barriers    Last practice recorded BP readings:  BP Readings from Last 3 Encounters:   12/11/22 (!) 130/0     06/17/23  87/65  01/20/23         142/9  Most recent eGFR/CrCl: No results found for: "EGFR"  No components found for: "CRCL"  Diabetes Interventions:  (Status:  New goal.) Long Term Goal Assessed patient's understanding of A1c goal: <7% Reviewed medications with patient and discussed importance of medication adherence Counseled on importance of regular laboratory monitoring as prescribed Discussed plans with patient for ongoing care management follow up and provided patient with direct contact information for care management team Reviewed scheduled/upcoming provider appointments  Advised patient, providing education and rationale, to check cbg as directed and record, calling provider  for findings outside established parameters Review of patient status, including review of consultants reports, relevant laboratory and other test results, and medications completed Assessed social determinant of health barriers Lab Results  Component Value Date   HGBA1C 5.3 09/16/2022             HGBA1C                                                      5.9                                    01/20/2023            HGBA1C                                                      5.5                                    03/18/2023  Hyperlipidemia Interventions:  (Status:  New goal.) Long Term Goal Medication review performed; medication list updated in electronic medical record.  Provider established cholesterol goals reviewed Counseled on importance of regular laboratory monitoring as prescribed Reviewed importance of limiting foods high in cholesterol Assessed social determinant of health barriers   Hypertension Interventions:  (Status:  New goal.) Long Term Goal Last practice recorded BP readings:  BP Readings from  Last 3 Encounters:  12/11/22 (!) 130/0  06/17/23  87/65     03/18/23         108/84  Most recent eGFR/CrCl: No results found for: "EGFR"  No components found for:  "CRCL"  Evaluation of current treatment plan related to hypertension self management and patient's adherence to plan as established by provider Reviewed medications with patient and discussed importance of compliance Discussed plans with patient for ongoing care management follow up and provided patient with direct contact information for care management team Advised patient, providing education and rationale, to monitor blood pressure daily and record, calling PCP for findings outside established parameters Reviewed scheduled/upcoming provider appointments  Assessed social determinant of health barriers   Patient Goals/Self-Care Activities Over the next 90 days, patient will:  -Attends all scheduled provider appointments Calls provider office for new concerns or questionss 06/30/23: Patient will f/u on Medicaid reinstatement.  Follow Up Plan: The Managed Medicaid care management team will reach out to the patient again over the next 60 business  days.  The patient has been provided with contact information for the Managed Medicaid care management team and has been advised to call with any health related questions or concerns.    Follow Up:  Patient agrees to Care Plan and Follow-up.  Plan: The Managed Medicaid care management team will reach out to the patient again over the next 60 business  days. and The  Patient has been provided with contact information for the Managed Medicaid care management team and has been advised to call with any health related questions or concerns.  Date/time of next scheduled RN care management/care coordination outreach:  08/30/23

## 2023-06-30 NOTE — Progress Notes (Signed)
Patient presents for nurse visit today in VAD Clinic today alone. Reports no problem with VAD or equipment.  Pt paged VAD Coordinator today complaining of blood in stool. He reports he had 2 bowel movements this morning both with moderate bright red blood both in the stool and seen upon wiping. Pt reports no constipation, straining or pain. Pt states he had another bowel movement before coming to VAD Clinic and reported no blood seen. Pt states he continues to eat a vegan diet.  Pt saw PCP this morning. Iron panel collected 9/16. Pt informed that he would benefit from IV Iron but these infusions have to have a PA through insurance and the pt is currently uninsured. Pt was instructed that he could start a PO iron supplement if he would like at this time but has yet to do so. Pt saw PCP today who advised for him to take iron supplement as well. Iron panel drawn at today's PCP appointment.   Vital Signs:  Doppler Pressure: 100 Automatic BP: 127/106 (114) HR: 77 SPO2: 96% on RA   Weight: 290 lb w/ eqt Last weight:  296 lbs w/eqt  VAD Indication: Destination Therapy due to smoking and BMI   VAD interrogation & Equipment Management: Speed: 6200 Flow: 4.8 Power: 5.1 w    PI: 3.4 Hct: 38   Alarms: few LV alarms; multiple NO EXTERNAL POWER alarms overnight in 1 minute span (I suspect the power may have flickered) Events: 3-10 daily  Fixed speed 6200 Low speed limit: 5900   Primary Controller:  Replace back up battery in 7 months. Back up controller:  Replace back up battery in 10 months.   Annual Equipment Maintenance on UBC/PM was performed on 06/17/23.    I reviewed the LVAD parameters from today and compared the results to the patient's prior recorded data. LVAD interrogation was NEGATIVE for significant power changes, NEGATIVE for clinical alarms and STABLE for PI events/speed drops. No programming changes were made and pump is functioning within specified parameters. Pt is performing  daily controller and system monitor self tests along with completing weekly and monthly maintenance for LVAD equipment.   LVAD equipment check completed and is in good working order. Back-up equipment not present.   BP & Labs:  Doppler 100 - Doppler is reflecting MAP   Hgb 11.3 - Stable from last collect   INR 1.9   LDH stable at 179 with established baseline of 160 - 300. Denies tea-colored urine. No power elevations noted on interrogation.   Plan:  Lab results stable; Will add CBC to next weeks INR check Coumadin dosing per Le[facility] Advised pt to page VAD Coordinator if bleeding reoccurs in stool or with increased dizziness, lightheadedness or weakness  Simmie Davies RN,BSN VAD Coordinator  Office: [phone]  24/7 Pager: [phone]

## 2023-06-30 NOTE — Addendum Note (Signed)
Encounter addended by: Flora Lipps, RN on: 06/30/2023 2:28 PM  Actions taken: Pend clinical note, Charge Capture section accepted

## 2023-06-30 NOTE — Addendum Note (Signed)
Encounter addended by: Flora Lipps, RN on: 06/30/2023 3:30 PM  Actions taken: Clinical Note Signed

## 2023-06-30 NOTE — Progress Notes (Signed)
Subjective   Patient ID: Jeffrey [name], male    DOB: [birth year], 47 y.o.   MRN: [id number]  Chief Complaint  Patient presents with   Follow-up    Referring provider: Ivonne Andrew, NP  [name] is a 47 y.o. male with Past Medical History: No date: Anxiety No date: Aspirin allergy No date: Childhood asthma No date: Chronic systolic CHF (congestive heart failure) (HCC)     Comment:  a. EF 20-25% in 2012. b. EF 45-50% in 10/2011 with               nonischemic nuc - presumed NICM. c. 12/2014 Echo: Sev               depressed LV fxn, sev dil LV, mild LVH, mild MR, sev dil               LA, mildly reduced RV fxn. No date: CKD (chronic kidney disease) stage 2, GFR 60-89 ml/min 12/2019: H/O vasectomy No date: High cholesterol No date: Hypertension No date: Morbid obesity (HCC) No date: Nephrolithiasis No date: OSA on CPAP No date: Paroxysmal atrial fibrillation (HCC) No date: Presumed NICM     Comment:  a. 04/2014 Myoview: EF 26%, glob HK, sev glob HK, ? prior              infarct;  b. Never cathed 2/2 CKD. No date: Renal cell carcinoma (HCC)     Comment:  a. s/p Rt robotic assisted partial converted to radical               nephrectomy on 01/2013. No date: Troponin level elevated     Comment:  a. 04/2014, 12/2014: felt due to CHF. No date: Type II diabetes mellitus (HCC) No date: Ventricular tachycardia (HCC)     Comment:  a. appropriate ICD therapy 12/2017   HPI  Diabetes Mellitus: Patient presents for follow up of diabetes. Symptoms: none. Denies any symptoms. Patient denies foot ulcerations, hypoglycemia , and nausea.  Evaluation to date has been included: hemoglobin A1C.  Home sugars: patient does not check sugars. Treatment to date: no recent interventions. A1C is 5.7.    Hypertension: Patient here for follow-up of elevated blood pressure. Patient is trying to loose weight for possible heart transplant. Cardiac symptoms none. Patient denies claudication, dyspnea,  fatigue, and near-syncope.  Cardiovascular risk factors: hypertension, male gender, and obesity (BMI >= 30 kg/m2). Use of agents associated with hypertension: none.  Blood pressures have been low recently.  We will decrease dosage of amlodipine and consult with pharmacy.  We have sent a message to cardiology as well.  Patient did have a low hemoglobin at recent lab checks through cardiology.  He was advised to start iron supplements.  He states that he has not done this.  We will start him on vitamin B12 which was low also, folic acid, iron supplement.   Denies f/c/s, n/v/d, hemoptysis, PND, leg swelling Denies chest pain or edema   Allergies  Allergen Reactions   Aspirin Shortness Of Breath, Itching and Rash     Burning sensation (Patient reports he tolerates other NSAIDS)    Bee Venom Hives and Swelling   Lisinopril Cough   Tomato Hives    Immunization History  Administered Date(s) Administered   Influenza Split 10/24/2011   Influenza,inj,Quad PF,6+ Mos 07/23/2017, 09/06/2018, 09/18/2019   Pneumococcal Conjugate-13 11/01/2017   Pneumococcal Polysaccharide-23 10/24/2011   Tdap 04/07/2015    Tobacco History: Social History  Tobacco Use  Smoking Status Former   Current packs/day: 0.00   Average packs/day: 0.3 packs/day for 22.0 years (5.5 ttl pk-yrs)   Types: Cigarettes   Start date: 04/29/1993   Quit date: 04/30/2015   Years since quitting: 8.1  Smokeless Tobacco Never   Counseling given: Not Answered   Outpatient Encounter Medications as of 06/30/2023  Medication Sig   Accu-Chek FastClix Lancets MISC USE AS DIRECTED FOUR TIMES DAILY   acetaminophen (TYLENOL) 325 MG tablet Take 2 tablets (650 mg total) by mouth every 6 (six) hours as needed for fever.   allopurinol (ZYLOPRIM) 100 MG tablet Take 1 tablet (100 mg total) by mouth daily.   amiodarone (PACERONE) 200 MG tablet Take 1 tablet (200 mg total) by mouth daily.   amLODipine (NORVASC) 2.5 MG tablet Take 1 tablet (2.5  mg total) by mouth daily.   Blood Pressure KIT 1 Units by Does not apply route daily.   cefadroxil (DURICEF) 500 MG capsule Take 1 capsule (500 mg total) by mouth 2 (two) times daily.   colchicine 0.6 MG tablet Take 1 tablet (0.6 mg total) by mouth once daily.   cyanocobalamin (VITAMIN B12) 1000 MCG tablet Take 1 tablet (1,000 mcg total) by mouth daily.   doxazosin (CARDURA) 2 MG tablet Take 1 tablet (2 mg total) by mouth 2 (two) times daily.   ferrous sulfate 325 (65 FE) MG tablet Take 1 tablet (325 mg total) by mouth daily before breakfast.   folic acid (FOLVITE) 800 MCG tablet Take 0.5 tablets (400 mcg total) by mouth daily.   gabapentin (NEURONTIN) 400 MG capsule Take 1 capsule (400 mg total) by mouth 3 (three) times daily.   glucose blood test strip Use as directed 4 times daily.   isosorbide-hydrALAZINE (BIDIL) 20-37.5 MG tablet Take 2 tablets by mouth 3 (three) times daily.   meclizine (ANTIVERT) 25 MG tablet Take 1 tablet (25 mg total) by mouth 3 (three) times daily as needed for dizziness.   metFORMIN (GLUCOPHAGE) 1000 MG tablet Take 1 tablet (1,000 mg total) by mouth 2 (two) times daily with a meal.   pantoprazole (PROTONIX) 40 MG tablet Take 1 tablet (40 mg total) by mouth once daily.   rosuvastatin (CRESTOR) 10 MG tablet Take 1 tablet (10 mg total) by mouth daily.   sacubitril-valsartan (ENTRESTO) 97-103 MG Take 1 tablet by mouth 2 (two) times daily.   sertraline (ZOLOFT) 100 MG tablet Take 1.5 tablets (150 mg total) by mouth daily.   spironolactone (ALDACTONE) 25 MG tablet Take 1 tablet (25 mg total) by mouth daily.   traMADol HCl 100 MG TABS Take 1 tablet (100 mg) by mouth every 6 (six) hours as needed for pain   traZODone (DESYREL) 100 MG tablet Take 1 tablet (100 mg total) by mouth at bedtime.   warfarin (COUMADIN) 4 MG tablet Take 2 tablet (8 mg) daily or as directed by HF clinic (Patient taking differently: Take 8 mg by mouth daily at 4 PM. Take 3 tablets (12 mg) on Tuesdays,  and 2 tablet (8 mg) all other days, or as directed by HF clinic)   [DISCONTINUED] amLODipine (NORVASC) 10 MG tablet Take 1 tablet (10 mg total) by mouth daily   No facility-administered encounter medications on file as of 06/30/2023.    Review of Systems  Review of Systems  Constitutional: Negative.   HENT: Negative.    Cardiovascular: Negative.   Gastrointestinal: Negative.   Allergic/Immunologic: Negative.   Neurological: Negative.   Psychiatric/Behavioral: Negative.  Objective:   BP (!) 88/62 (BP Location: Left Arm, Patient Position: Sitting, Cuff Size: Large)   Pulse 74   Temp 98.7 F (37.1 C)   Resp 14   Ht 5\' 7"  (1.702 m)   Wt 290 lb (131.5 kg)   SpO2 99%   BMI 45.42 kg/m   Wt Readings from Last 5 Encounters:  06/30/23 290 lb (131.5 kg)  06/17/23 296 lb (134.3 kg)  04/14/23 295 lb 6.4 oz (134 kg)  03/18/23 290 lb (131.5 kg)  02/09/23 291 lb 3.2 oz (132.1 kg)     Physical Exam Vitals and nursing note reviewed.  Constitutional:      General: He is not in acute distress.    Appearance: He is well-developed.  Cardiovascular:     Rate and Rhythm: Normal rate and regular rhythm.  Pulmonary:     Effort: Pulmonary effort is normal.     Breath sounds: Normal breath sounds.  Skin:    General: Skin is warm and dry.  Neurological:     Mental Status: He is alert and oriented to person, place, and time.       Assessment & Plan:   Type 2 diabetes mellitus with stage 3a chronic kidney disease, with long-term current use of insulin (HCC) -     POCT glycosylated hemoglobin (Hb A1C)  Essential hypertension -     AMB Referral to Pharmacy Medication Management -     amLODIPine Besylate; Take 1 tablet (2.5 mg total) by mouth daily.  Dispense: 90 tablet; Refill: 0 -     Blood Pressure; 1 Units by Does not apply route daily.  Dispense: 1 kit; Refill: 0  Anemia, unspecified type -     Vitamin B-12; Take 1 tablet (1,000 mcg total) by mouth daily.  Dispense: 30  tablet; Refill: 0 -     Folic Acid; Take 0.5 tablets (400 mcg total) by mouth daily.  Dispense: 30 tablet; Refill: 2 -     Ferrous Sulfate; Take 1 tablet (325 mg total) by mouth daily before breakfast.  Dispense: 90 tablet; Refill: 1 -     Iron, TIBC and Ferritin Panel  Blood in stool -     Ambulatory referral to Gastroenterology     Return in about 3 months (around 09/29/2023).    Ivonne Andrew, NP 06/30/2023

## 2023-06-30 NOTE — Addendum Note (Signed)
Encounter addended by: Flora Lipps, RN on: 06/30/2023 3:31 PM  Actions taken: Vitals modified

## 2023-07-01 ENCOUNTER — Other Ambulatory Visit: Payer: Self-pay

## 2023-07-01 LAB — IRON,TIBC AND FERRITIN PANEL
Ferritin: 38 ng/mL (ref 30–400)
Iron Saturation: 5 % — CL (ref 15–55)
Iron: 21 ug/dL — ABNORMAL LOW (ref 38–169)
Total Iron Binding Capacity: 383 ug/dL (ref 250–450)
UIBC: 362 ug/dL — ABNORMAL HIGH (ref 111–343)

## 2023-07-01 MED ORDER — AMLODIPINE BESYLATE 10 MG PO TABS
10.0000 mg | ORAL_TABLET | Freq: Every day | ORAL | 6 refills | Status: DC
  Filled 2023-07-01: qty 30, 30d supply, fill #0
  Filled 2023-08-26: qty 30, 30d supply, fill #1
  Filled 2023-10-12: qty 30, 30d supply, fill #2
  Filled 2023-11-11: qty 30, 30d supply, fill #3
  Filled 2023-12-18: qty 30, 30d supply, fill #4
  Filled 2024-01-19: qty 30, 30d supply, fill #5
  Filled 2024-02-16: qty 30, 30d supply, fill #6

## 2023-07-01 NOTE — Addendum Note (Signed)
Addended by: Ivonne Andrew on: 07/01/2023 08:26 AM   Modules accepted: Orders

## 2023-07-02 ENCOUNTER — Other Ambulatory Visit (HOSPITAL_COMMUNITY): Payer: Self-pay

## 2023-07-02 ENCOUNTER — Other Ambulatory Visit: Payer: Self-pay | Admitting: Pharmacist

## 2023-07-02 ENCOUNTER — Other Ambulatory Visit: Payer: Self-pay

## 2023-07-02 ENCOUNTER — Other Ambulatory Visit (HOSPITAL_COMMUNITY): Payer: Self-pay | Admitting: Internal Medicine

## 2023-07-02 MED ORDER — AMIODARONE HCL 200 MG PO TABS
200.0000 mg | ORAL_TABLET | Freq: Every day | ORAL | 11 refills | Status: DC
  Filled 2023-07-02: qty 30, 30d supply, fill #0
  Filled 2023-08-26: qty 30, 30d supply, fill #1
  Filled 2024-01-19: qty 30, 30d supply, fill #2
  Filled 2024-02-16: qty 30, 30d supply, fill #3
  Filled 2024-03-17: qty 30, 30d supply, fill #4
  Filled 2024-04-19 – 2024-04-21 (×2): qty 30, 30d supply, fill #5
  Filled 2024-06-23: qty 30, 30d supply, fill #6

## 2023-07-02 NOTE — Progress Notes (Signed)
07/02/2023 Name: Jeffrey [name] MRN: [id number] DOB: 11-Apr-1976  Chief Complaint  Patient presents with   Hypertension    KENGO BOYAJIAN is a 47 y.o. year old male who presented for a telephone visit.   They were referred to the pharmacist by their PCP for assistance in managing  hypotension .    Subjective:  Care Team: Primary Care Provider: Ivonne Andrew, NP ; Next Scheduled Visit: 09/22/23 Clinical Pharmacist: Marlowe Aschoff, PharmD  Medication Access/Adherence  Current Pharmacy:  [facility] [address], [address] [place] [address] Phone: [phone] Fax: [phone]  [id number] - [place], [place] - [address] AT [facility] [address]  [place] [address] Phone: [phone] Fax: [phone]   Patient reports affordability concerns with their medications: No  Patient reports access/transportation concerns to their pharmacy: No  Patient reports adherence concerns with their medications:  No     Hypertension:  Current medications: Amlodipine 10mg , Doxazosin 2mg , Bidil 20-37.5mg , Entresto 97-103mg , Spironolactone 25mg  Medications previously tried: Lisinopril (cough)  Patient has a validated, automated, upper arm home BP cuff  - Has a cuff but needs batteries currently- someone is bringing them today - BP was low at last visit, but good at Cardiology so Amlodipine was re-increased back to Amlodipine 10mg   Patient denies hypotensive s/sx including dizziness, lightheadedness.  Patient denies hypertensive symptoms including headache, chest pain, shortness of breath  Diabetes: - History of taking Jardiance (unknown why stopped) and Ozempic too (stopped due to lack of insurance coverage)- could qualify now due to no insurance  Objective:  Lab Results  Component Value Date   HGBA1C 5.7 06/30/2023    Lab Results  Component Value Date   CREATININE 1.53  (H) 06/30/2023   BUN 22 (H) 06/30/2023   NA 137 06/30/2023   K 4.4 06/30/2023   CL 109 06/30/2023   CO2 20 (L) 06/30/2023    Lab Results  Component Value Date   CHOL 123 03/18/2023   HDL 46 03/18/2023   LDLCALC 53 03/18/2023   TRIG 138 03/18/2023   CHOLHDL 2.7 03/18/2023    Medications Reviewed Today   Medications were not reviewed in this encounter       Assessment/Plan:   Hypertension: - Currently controlled - Reviewed long term cardiovascular and renal outcomes of uncontrolled blood pressure - Reviewed appropriate blood pressure monitoring technique and reviewed goal blood pressure. Recommended to check home blood pressure and heart rate twice a day    **Follow Up Plan:**  - F/u in 2 weeks- need BP readings from twice a day (recommended writing times down) - Reports starting iron, B12, and folic acid supplements- considering taking pre-natal vitamins instead to meet these goals - Reviewed changing iron dosing to M,W,F or every other day dosing if constipation occurs- discussed he was looking for vegan iron - Called [facility] yesterday about getting Entresto PAP started and potentially restart Ozempic- waiting to hear back (likely Tuesday); no Medicaid/insurance at this time due to wife making "too much money"  *Of note, patient is vegan  Length of visit: 20 minutes   Marlowe Aschoff, PharmD [facility] Phone Number: [phone]

## 2023-07-08 ENCOUNTER — Other Ambulatory Visit (HOSPITAL_COMMUNITY): Payer: Self-pay

## 2023-07-12 ENCOUNTER — Other Ambulatory Visit: Payer: Self-pay

## 2023-07-15 ENCOUNTER — Ambulatory Visit (HOSPITAL_COMMUNITY): Payer: Self-pay | Admitting: Pharmacist

## 2023-07-15 ENCOUNTER — Ambulatory Visit (HOSPITAL_COMMUNITY)
Admission: RE | Admit: 2023-07-15 | Discharge: 2023-07-15 | Disposition: A | Discharge status: Home / Self Care | Payer: Self-pay | Source: Ambulatory Visit | Attending: Cardiology | Admitting: Cardiology

## 2023-07-15 DIAGNOSIS — Z4509 Encounter for adjustment and management of other cardiac device: Secondary | ICD-10-CM | POA: Insufficient documentation

## 2023-07-15 DIAGNOSIS — Z95811 Presence of heart assist device: Secondary | ICD-10-CM | POA: Insufficient documentation

## 2023-07-15 DIAGNOSIS — Z7901 Long term (current) use of anticoagulants: Secondary | ICD-10-CM | POA: Insufficient documentation

## 2023-07-15 LAB — PROTIME-INR
INR: 2.6 — ABNORMAL HIGH (ref 0.8–1.2)
Prothrombin Time: 28.2 s — ABNORMAL HIGH (ref 11.4–15.2)

## 2023-07-16 ENCOUNTER — Encounter: Payer: Self-pay | Admitting: Pharmacist

## 2023-07-16 ENCOUNTER — Other Ambulatory Visit: Payer: Self-pay | Admitting: Pharmacist

## 2023-07-16 NOTE — Progress Notes (Addendum)
07/16/2023 Name: Jeffrey [name] MRN: [id number] DOB: Aug 08, 1976  Chief Complaint  Patient presents with   Hypotension    Jeffrey [name] is a 47 y.o. year old male who presented for a telephone visit.   They were referred to the pharmacist by their PCP for assistance in managing  hypotension .    Subjective:  Care Team: Primary Care Provider: Ivonne Andrew, NP ; Next Scheduled Visit: 09/22/23 Clinical Pharmacist: Marlowe Aschoff, PharmD  Medication Access/Adherence  Current Pharmacy:  [facility] - [facility] [address], [address] [place] [address] Phone: [phone] Fax: [phone]  [id number] - [place], [place] - [address] AT [facility] [address] [place] [address] Phone: [phone] Fax: [phone]   Patient reports affordability concerns with their medications: No  Patient reports access/transportation concerns to their pharmacy: No  Patient reports adherence concerns with their medications:  No     Hypertension:  Current medications: Amlodipine 10mg , Doxazosin 2mg , Bidil 20-37.5mg , Entresto 97-103mg , Spironolactone 25mg  Medications previously tried: Lisinopril (cough)  Patient has a validated, automated, upper arm home BP cuff   Patient denies hypotensive s/sx including dizziness, lightheadedness.  Patient denies hypertensive symptoms including headache, chest pain, shortness of breath  Diabetes: - History of taking Jardiance (unknown why stopped) and Ozempic too (stopped due to lack of insurance coverage)- could qualify now due to no insurance  Objective:  Lab Results  Component Value Date   HGBA1C 5.7 06/30/2023    Lab Results  Component Value Date   CREATININE 1.53 (H) 06/30/2023   BUN 22 (H) 06/30/2023   NA 137 06/30/2023   K 4.4 06/30/2023   CL 109 06/30/2023   CO2 20 (L) 06/30/2023    Lab Results  Component Value Date   CHOL 123  03/18/2023   HDL 46 03/18/2023   LDLCALC 53 03/18/2023   TRIG 138 03/18/2023   CHOLHDL 2.7 03/18/2023    Medications Reviewed Today   Medications were not reviewed in this encounter       Assessment/Plan:   Hypertension: - Currently controlled - Reviewed long term cardiovascular and renal outcomes of uncontrolled blood pressure - Reviewed appropriate blood pressure monitoring technique and reviewed goal blood pressure. Recommended to check home blood pressure and heart rate twice a day    **Follow Up Plan:**  - F/u in over 1 month- on 09/07/23 for BP review + see if Medicaid is completed (if not, start PAP for Entresto and Ozempic); review 08/23/23 Cardiology note - On morning walk during call so unable to give exact numbers for BG and BP readings - BP was 109/87 this morning and has no more dizziness- has NOT been too low or high (systolic 100-120's) - BG has been doing good as well- continues checking twice a day   *Of note, patient is vegan and has NO insurance currently   Marlowe Aschoff, PharmD [facility] Phone Number: [phone]

## 2023-07-20 ENCOUNTER — Other Ambulatory Visit: Payer: Self-pay

## 2023-07-27 ENCOUNTER — Other Ambulatory Visit: Payer: Self-pay

## 2023-07-29 ENCOUNTER — Other Ambulatory Visit: Payer: Self-pay

## 2023-07-30 ENCOUNTER — Other Ambulatory Visit (HOSPITAL_COMMUNITY): Payer: Self-pay | Admitting: *Deleted

## 2023-07-30 DIAGNOSIS — Z7901 Long term (current) use of anticoagulants: Secondary | ICD-10-CM

## 2023-07-30 DIAGNOSIS — Z95811 Presence of heart assist device: Secondary | ICD-10-CM

## 2023-08-03 ENCOUNTER — Other Ambulatory Visit: Payer: Self-pay

## 2023-08-03 ENCOUNTER — Other Ambulatory Visit (HOSPITAL_COMMUNITY): Payer: Self-pay

## 2023-08-03 ENCOUNTER — Other Ambulatory Visit (HOSPITAL_BASED_OUTPATIENT_CLINIC_OR_DEPARTMENT_OTHER): Payer: Self-pay

## 2023-08-04 ENCOUNTER — Other Ambulatory Visit: Payer: Self-pay

## 2023-08-05 ENCOUNTER — Ambulatory Visit (HOSPITAL_COMMUNITY)
Admission: RE | Admit: 2023-08-05 | Discharge: 2023-08-05 | Disposition: A | Discharge status: Home / Self Care | Payer: Medicaid Other | Source: Ambulatory Visit | Attending: Internal Medicine | Admitting: Internal Medicine

## 2023-08-05 ENCOUNTER — Ambulatory Visit (HOSPITAL_COMMUNITY): Payer: Self-pay | Admitting: Pharmacist

## 2023-08-05 DIAGNOSIS — Z7901 Long term (current) use of anticoagulants: Secondary | ICD-10-CM | POA: Insufficient documentation

## 2023-08-05 DIAGNOSIS — Z95811 Presence of heart assist device: Secondary | ICD-10-CM | POA: Diagnosis not present

## 2023-08-05 LAB — PROTIME-INR
INR: 1.9 — ABNORMAL HIGH (ref 0.8–1.2)
Prothrombin Time: 22.4 s — ABNORMAL HIGH (ref 11.4–15.2)

## 2023-08-06 ENCOUNTER — Other Ambulatory Visit: Payer: Self-pay

## 2023-08-23 ENCOUNTER — Encounter (HOSPITAL_COMMUNITY): Payer: Self-pay | Admitting: Internal Medicine

## 2023-08-23 ENCOUNTER — Other Ambulatory Visit (HOSPITAL_COMMUNITY): Payer: Self-pay

## 2023-08-23 DIAGNOSIS — Z95811 Presence of heart assist device: Secondary | ICD-10-CM

## 2023-08-23 DIAGNOSIS — Z7901 Long term (current) use of anticoagulants: Secondary | ICD-10-CM

## 2023-08-24 ENCOUNTER — Encounter (HOSPITAL_COMMUNITY): Payer: Self-pay | Admitting: Internal Medicine

## 2023-08-26 ENCOUNTER — Other Ambulatory Visit: Payer: Self-pay

## 2023-08-26 ENCOUNTER — Other Ambulatory Visit (HOSPITAL_COMMUNITY): Payer: Self-pay | Admitting: Internal Medicine

## 2023-08-26 DIAGNOSIS — T827XXA Infection and inflammatory reaction due to other cardiac and vascular devices, implants and grafts, initial encounter: Secondary | ICD-10-CM

## 2023-08-26 DIAGNOSIS — Z7901 Long term (current) use of anticoagulants: Secondary | ICD-10-CM

## 2023-08-26 DIAGNOSIS — I5023 Acute on chronic systolic (congestive) heart failure: Secondary | ICD-10-CM

## 2023-08-26 MED ORDER — TRAZODONE HCL 100 MG PO TABS
100.0000 mg | ORAL_TABLET | Freq: Every day | ORAL | 11 refills | Status: AC
  Filled 2023-08-26: qty 30, 30d supply, fill #0
  Filled 2023-11-11: qty 30, 30d supply, fill #1
  Filled 2024-01-19: qty 30, 30d supply, fill #2
  Filled 2024-02-16: qty 30, 30d supply, fill #3
  Filled 2024-04-19 – 2024-04-21 (×2): qty 30, 30d supply, fill #4

## 2023-08-26 MED ORDER — PANTOPRAZOLE SODIUM 40 MG PO TBEC
40.0000 mg | DELAYED_RELEASE_TABLET | Freq: Every day | ORAL | 3 refills | Status: DC
  Filled 2023-08-26: qty 90, 90d supply, fill #0
  Filled 2024-01-19: qty 90, 90d supply, fill #1
  Filled 2024-04-19: qty 90, 90d supply, fill #2
  Filled 2024-04-21: qty 30, 30d supply, fill #2
  Filled 2024-05-22: qty 30, 30d supply, fill #3
  Filled 2024-08-02: qty 30, 30d supply, fill #4

## 2023-08-27 ENCOUNTER — Other Ambulatory Visit: Payer: Self-pay

## 2023-08-27 ENCOUNTER — Ambulatory Visit (HOSPITAL_COMMUNITY)
Admission: RE | Admit: 2023-08-27 | Discharge: 2023-08-27 | Disposition: A | Discharge status: Home / Self Care | Payer: Medicaid Other | Source: Ambulatory Visit | Attending: Internal Medicine | Admitting: Internal Medicine

## 2023-08-27 ENCOUNTER — Ambulatory Visit (HOSPITAL_COMMUNITY): Payer: Self-pay | Admitting: Pharmacist

## 2023-08-27 ENCOUNTER — Encounter (HOSPITAL_COMMUNITY): Payer: Self-pay | Admitting: Internal Medicine

## 2023-08-27 VITALS — BP 120/1 | HR 83 | Wt 300.8 lb

## 2023-08-27 DIAGNOSIS — Z7901 Long term (current) use of anticoagulants: Secondary | ICD-10-CM | POA: Diagnosis not present

## 2023-08-27 DIAGNOSIS — G4733 Obstructive sleep apnea (adult) (pediatric): Secondary | ICD-10-CM | POA: Diagnosis not present

## 2023-08-27 DIAGNOSIS — I152 Hypertension secondary to endocrine disorders: Secondary | ICD-10-CM

## 2023-08-27 DIAGNOSIS — F419 Anxiety disorder, unspecified: Secondary | ICD-10-CM | POA: Insufficient documentation

## 2023-08-27 DIAGNOSIS — Z85528 Personal history of other malignant neoplasm of kidney: Secondary | ICD-10-CM | POA: Diagnosis not present

## 2023-08-27 DIAGNOSIS — I428 Other cardiomyopathies: Secondary | ICD-10-CM | POA: Diagnosis not present

## 2023-08-27 DIAGNOSIS — Z95810 Presence of automatic (implantable) cardiac defibrillator: Secondary | ICD-10-CM | POA: Diagnosis not present

## 2023-08-27 DIAGNOSIS — I480 Paroxysmal atrial fibrillation: Secondary | ICD-10-CM | POA: Insufficient documentation

## 2023-08-27 DIAGNOSIS — T827XXA Infection and inflammatory reaction due to other cardiac and vascular devices, implants and grafts, initial encounter: Secondary | ICD-10-CM

## 2023-08-27 DIAGNOSIS — R002 Palpitations: Secondary | ICD-10-CM | POA: Insufficient documentation

## 2023-08-27 DIAGNOSIS — N1832 Chronic kidney disease, stage 3b: Secondary | ICD-10-CM | POA: Insufficient documentation

## 2023-08-27 DIAGNOSIS — E1159 Type 2 diabetes mellitus with other circulatory complications: Secondary | ICD-10-CM | POA: Diagnosis not present

## 2023-08-27 DIAGNOSIS — F17200 Nicotine dependence, unspecified, uncomplicated: Secondary | ICD-10-CM | POA: Diagnosis not present

## 2023-08-27 DIAGNOSIS — I10 Essential (primary) hypertension: Secondary | ICD-10-CM | POA: Diagnosis not present

## 2023-08-27 DIAGNOSIS — E1122 Type 2 diabetes mellitus with diabetic chronic kidney disease: Secondary | ICD-10-CM | POA: Insufficient documentation

## 2023-08-27 DIAGNOSIS — I471 Supraventricular tachycardia, unspecified: Secondary | ICD-10-CM | POA: Insufficient documentation

## 2023-08-27 DIAGNOSIS — F32A Depression, unspecified: Secondary | ICD-10-CM | POA: Insufficient documentation

## 2023-08-27 DIAGNOSIS — I493 Ventricular premature depolarization: Secondary | ICD-10-CM | POA: Insufficient documentation

## 2023-08-27 DIAGNOSIS — I5022 Chronic systolic (congestive) heart failure: Secondary | ICD-10-CM | POA: Diagnosis not present

## 2023-08-27 DIAGNOSIS — Z452 Encounter for adjustment and management of vascular access device: Secondary | ICD-10-CM | POA: Diagnosis present

## 2023-08-27 DIAGNOSIS — I130 Hypertensive heart and chronic kidney disease with heart failure and stage 1 through stage 4 chronic kidney disease, or unspecified chronic kidney disease: Secondary | ICD-10-CM | POA: Insufficient documentation

## 2023-08-27 DIAGNOSIS — Z6841 Body Mass Index (BMI) 40.0 and over, adult: Secondary | ICD-10-CM | POA: Diagnosis not present

## 2023-08-27 DIAGNOSIS — Z7984 Long term (current) use of oral hypoglycemic drugs: Secondary | ICD-10-CM | POA: Insufficient documentation

## 2023-08-27 DIAGNOSIS — Z79899 Other long term (current) drug therapy: Secondary | ICD-10-CM | POA: Insufficient documentation

## 2023-08-27 DIAGNOSIS — Z95811 Presence of heart assist device: Secondary | ICD-10-CM

## 2023-08-27 DIAGNOSIS — M109 Gout, unspecified: Secondary | ICD-10-CM | POA: Insufficient documentation

## 2023-08-27 DIAGNOSIS — Z905 Acquired absence of kidney: Secondary | ICD-10-CM | POA: Insufficient documentation

## 2023-08-27 LAB — BASIC METABOLIC PANEL
Anion gap: 8 (ref 5–15)
BUN: 30 mg/dL — ABNORMAL HIGH (ref 6–20)
CO2: 21 mmol/L — ABNORMAL LOW (ref 22–32)
Calcium: 9.8 mg/dL (ref 8.9–10.3)
Chloride: 109 mmol/L (ref 98–111)
Creatinine, Ser: 1.83 mg/dL — ABNORMAL HIGH (ref 0.61–1.24)
GFR, Estimated: 45 mL/min — ABNORMAL LOW (ref >60)
Glucose, Bld: 150 mg/dL — ABNORMAL HIGH (ref 70–99)
Potassium: 4.4 mmol/L (ref 3.5–5.1)
Sodium: 138 mmol/L (ref 135–145)

## 2023-08-27 LAB — CBC
HCT: 39.9 % (ref 39.0–52.0)
Hemoglobin: 12.3 g/dL — ABNORMAL LOW (ref 13.0–17.0)
MCH: 24 pg — ABNORMAL LOW (ref 26.0–34.0)
MCHC: 30.8 g/dL (ref 30.0–36.0)
MCV: 77.9 fL — ABNORMAL LOW (ref 80.0–100.0)
Platelets: 212 10*3/uL (ref 150–400)
RBC: 5.12 MIL/uL (ref 4.22–5.81)
RDW: 21.5 % — ABNORMAL HIGH (ref 11.5–15.5)
WBC: 6.1 10*3/uL (ref 4.0–10.5)
nRBC: 0 % (ref 0.0–0.2)

## 2023-08-27 LAB — LACTATE DEHYDROGENASE: LDH: 186 U/L (ref 98–192)

## 2023-08-27 LAB — PROTIME-INR
INR: 2.2 — ABNORMAL HIGH (ref 0.8–1.2)
Prothrombin Time: 25 s — ABNORMAL HIGH (ref 11.4–15.2)

## 2023-08-27 MED ORDER — DOXAZOSIN MESYLATE 2 MG PO TABS
6.0000 mg | ORAL_TABLET | Freq: Two times a day (BID) | ORAL | 3 refills | Status: DC
  Filled 2023-08-27: qty 180, 30d supply, fill #0

## 2023-08-27 MED ORDER — CLONIDINE HCL 0.1 MG PO TABS
0.1000 mg | ORAL_TABLET | Freq: Once | ORAL | Status: AC
  Administered 2023-08-27: 0.1 mg via ORAL

## 2023-08-27 NOTE — Patient Instructions (Addendum)
Increase Doxazosin to 6mg  twice a day Return to [facility] Clinic in 1 week for BP check please take medications before coming to clinic and bring blood pressure log Coumadin dosing per Leotis Shames [facility] Return to [facility] Clinic in 2 months

## 2023-08-27 NOTE — Progress Notes (Signed)
Patient presents for 2 mo f/u in [facility] Clinic today alone. Reports no problem with VAD or equipment.  Pt states he is overall doing well. Denies falls, shortness of breath and heart failure symptoms.  Pt tells me that he has had a headache and been dizzy and nauseous for 3 days. Pt hypertensive today. Has been seeing outpatient pharmacist for blood pressure control referred by PCP. Discussed with Dr. Gala Romney. Will reach out to PCP as cardiac medications should only be managed by VAD team. Pt given Clonodine 0.1mg  today in clinic with improved of blood pressure. Orders received to increase Doxazosin to 6mg  BID for hypertension per Dr. Gala Romney and return to [facility] Clinic next week for BP check.  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   Pt reports his wife is doing weekly dressing changes. Pt states that some days he walks and some days he doesn't.  Pt states he noticed blood in his stool this morning. Hgb 12.3 today. Pt now taking iron supplement per PCP. Pt advised to reach out to VAD Coordinator if symptoms continue.  Vital Signs:  Doppler Pressure: 120 Automatic BP: 128/110 (117) Repeat: 97/86 (92) HR: 83 SPO2: 94% on RA   Weight: 300.8;bs lb w/ eqt Last weight:  296 lbs w/eqt   VAD Indication: Destination Therapy due to smoking and BMI   VAD interrogation & Equipment Management: Speed: 6200 Flow: 5 Power: 5.1 w    PI: 3.1 Hct: 38   Alarms: few LV alarms Events: 3-10 daily  Fixed speed 6200 Low speed limit: 5900   Primary Controller: Back up battery replaced at today's visit with [id number] Back up controller:  Replace back up battery in 10 months.   Annual Equipment Maintenance on UBC/PM was performed on 06/17/23.    I reviewed the LVAD parameters from today and compared the results to the patient's prior recorded data. LVAD interrogation was NEGATIVE for significant power changes, NEGATIVE for clinical alarms and STABLE  for PI events/speed drops. No programming changes were made and pump is functioning within specified parameters. Pt is performing daily controller and system monitor self tests along with completing weekly and monthly maintenance for LVAD equipment.   LVAD equipment check completed and is in good working order. Back-up equipment not present.    Exit Site Care: Drive line being maintained weekly by pt's wife. Existing dressing CDI. Pt given 8 weekly kits for home use.  Device:  Left subcutaneous dual ICD Therapies: on at 250 BPM Last check: 03/04/22  BP & Labs:  Doppler 90 - Doppler is reflecting MAP   Hgb 10.8 - No S/S of bleeding. Specifically denies melena/BRBPR or nosebleeds.   LDH stable at 161 with established baseline of 160 - 300. Denies tea-colored urine. No power elevations noted on interrogation.   Plan:  Increase Doxazosin to 6mg  twice a day Return to [facility] Clinic in 1 week for BP check please take medications before coming to clinic and bring blood pressure log Coumadin dosing per Leotis Shames [facility] Return to V[facility] Clinic in 2 months  Simmie Davies RN,BSN VAD Coordinator  Office: [phone]  24/7 Pager: [phone]

## 2023-08-29 NOTE — Progress Notes (Signed)
VAD CLINIC Follow-up Visit  PCP: Ivonne Andrew, NP HF MD: DB   HPI:  Jeffrey [name] is a 47 yo male with a history of  poorly controlled HTN, R renal cell carcinoma s/p nephrectomy, CKD IIIa, DM2, OSA, gout, morbid obesity and systolic HF due to NICM. 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 D/c'd home with plan for 6 weeks IV Ancef with plan to transition to cefadroxil for long-term suppression on 09/28/22.   Here for f/u. On cefadroxil for chronic DL infection.Healing well. MAPs running very high. Had HA earlier but now improved. No other neuro s/s. Denies orthopnea or PND. No fevers, chills. No melena. Had small amount of red blood in stool this am.. No VAD alarms. Taking all meds as prescribed but HTN meds being changed by PharmD in PCP Clinic.    VAD Indication: Destination Therapy due to smoking and BMI   VAD interrogation & Equipment Management: Speed: 6200 Flow: 5 Power: 5.1 w    PI: 3.1 Hct: 38   Alarms: few LV alarms Events: 3-10 daily  Fixed speed 6200 Low speed limit: 5900   Primary Controller: Back up battery replaced at today's visit with KG:[id number] Back up controller:  Replace back up battery in 10 months.   Annual Equipment Maintenance on UBC/PM was performed on 06/17/23.    I reviewed the LVAD parameters from today and compared the results to the patient's prior recorded data. LVAD interrogation was NEGATIVE for significant power changes, NEGATIVE for clinical alarms and STABLE for PI events/speed drops. No programming changes were made and pump is functioning within specified parameters. Pt is performing daily controller and system monitor self tests along with completing weekly and monthly maintenance for LVAD equipment.   LVAD equipment check completed and is in good working order. Back-up equipment not present.  Follow up for Heart Failure/LVAD:  Here for routine VAD  f/u. Continues on IV abx. Doing well. No fevers or chills. Wound vac working well. Stil with some pain at wound vac site. No drainage. Has intermittent dizziness and palpitations. Denies orthopnea or PND.  No bleeding, melena or neuro symptoms. No VAD alarms. Taking all meds as prescribed.      Past Medical History:  Diagnosis Date   Anxiety    Aspirin allergy    Childhood asthma    Chronic systolic CHF (congestive heart failure) (HCC)    a. EF 20-25% in 2012. b. EF 45-50% in 10/2011 with nonischemic nuc - presumed NICM. c. 12/2014 Echo: Sev depressed LV fxn, sev dil LV, mild LVH, mild MR, sev dil LA, mildly reduced RV fxn.   CKD (chronic kidney disease) stage 2, GFR 60-89 ml/min    H/O vasectomy 12/2019   High cholesterol    Hypertension    Morbid obesity (HCC)    Nephrolithiasis    OSA on CPAP    Paroxysmal atrial fibrillation (HCC)    Presumed NICM    a. 04/2014 Myoview: EF 26%, glob HK, sev glob HK, ? prior infarct;  b. Never cathed 2/2 CKD.   Renal cell carcinoma (HCC)    a. s/p Rt robotic assisted partial converted to radical nephrectomy on 01/2013.   Troponin level elevated    a. 04/2014, 12/2014: felt due to CHF.   Type II diabetes mellitus (HCC)    Ventricular tachycardia (HCC)    a. appropriate ICD therapy 12/2017  Current Outpatient Medications  Medication Sig Dispense Refill   acetaminophen (TYLENOL) 325 MG tablet Take 2 tablets (650 mg total) by mouth every 6 (six) hours as needed for fever.     allopurinol (ZYLOPRIM) 100 MG tablet Take 1 tablet (100 mg total) by mouth daily. 90 tablet 3   amiodarone (PACERONE) 200 MG tablet Take 1 tablet (200 mg total) by mouth daily. 30 tablet 11   amLODipine (NORVASC) 10 MG tablet Take 1 tablet (10 mg total) by mouth daily 30 tablet 6   Blood Pressure KIT 1 Units by Does not apply route daily. 1 kit 0   cefadroxil (DURICEF) 500 MG capsule Take 1 capsule (500 mg total) by mouth 2 (two) times daily. 180 capsule 3   colchicine 0.6 MG  tablet Take 1 tablet (0.6 mg total) by mouth once daily. 30 tablet 6   cyanocobalamin (VITAMIN B12) 1000 MCG tablet Take 1 tablet (1,000 mcg total) by mouth daily. 30 tablet 0   ferrous sulfate 325 (65 FE) MG tablet Take 1 tablet (325 mg total) by mouth daily before breakfast. 90 tablet 1   folic acid (FOLVITE) 800 MCG tablet Take 0.5 tablets (400 mcg total) by mouth daily. 30 tablet 2   gabapentin (NEURONTIN) 400 MG capsule Take 1 capsule (400 mg total) by mouth 3 (three) times daily. 30 capsule 6   glucose blood test strip Use as directed 4 times daily. 100 each 11   isosorbide-hydrALAZINE (BIDIL) 20-37.5 MG tablet Take 2 tablets by mouth 3 (three) times daily. 180 tablet 6   meclizine (ANTIVERT) 25 MG tablet Take 1 tablet (25 mg total) by mouth 3 (three) times daily as needed for dizziness. 90 tablet 2   metFORMIN (GLUCOPHAGE) 1000 MG tablet Take 1 tablet (1,000 mg total) by mouth 2 (two) times daily with a meal. 60 tablet 11   pantoprazole (PROTONIX) 40 MG tablet Take 1 tablet (40 mg total) by mouth once daily. 90 tablet 3   rosuvastatin (CRESTOR) 10 MG tablet Take 1 tablet (10 mg total) by mouth daily. 90 tablet 3   sacubitril-valsartan (ENTRESTO) 97-103 MG Take 1 tablet by mouth 2 (two) times daily. 60 tablet 6   sertraline (ZOLOFT) 100 MG tablet Take 1.5 tablets (150 mg total) by mouth daily. 45 tablet 6   spironolactone (ALDACTONE) 25 MG tablet Take 1 tablet (25 mg total) by mouth daily. 60 tablet 6   traZODone (DESYREL) 100 MG tablet Take 1 tablet (100 mg total) by mouth at bedtime. 30 tablet 11   warfarin (COUMADIN) 4 MG tablet Take 2 tablet (8 mg) daily or as directed by HF clinic (Patient taking differently: Take 8 mg by mouth daily at 4 PM. Take 3 tablets (12 mg) on Tuesdays, and 2 tablet (8 mg) all other days, or as directed by HF clinic) 90 tablet 11   doxazosin (CARDURA) 2 MG tablet Take 3 tablets (6 mg total) by mouth 2 (two) times daily. 180 tablet 3   traMADol HCl 100 MG TABS Take  1 tablet (100 mg) by mouth every 6 (six) hours as needed for pain (Patient not taking: Reported on 08/27/2023) 90 tablet 0   No current facility-administered medications for this encounter.    Aspirin, Bee venom, Lisinopril, and Tomato  REVIEW OF SYSTEMS: All systems negative except as listed in HPI, PMH and Problem list.   Vitals:   08/27/23 1727 08/27/23 1728 08/27/23 1729  BP: (!) 128/100 97/86 (!) 120/1  Pulse: 83  SpO2: 94%    Weight: (!) 136.4 kg (300 lb 12.8 oz)      Vital Signs:  Doppler Pressure: 120 Automatic BP: 128/110 (117) Repeat: 97/86 (92) HR: 83 SPO2: 94% on RA   Weight: 300.8;bs lb w/ eqt Last weight:  296 lbs w/eqt       Exam: General:  NAD.  HEENT: normal  Neck: supple. JVP not elevated.  Carotids 2+ bilat; no bruits. No lymphadenopathy or thryomegaly appreciated. Cor: LVAD hum.  Lungs: Clear. Abdomen: obese soft, nontender, non-distended. No hepatosplenomegaly. No bruits or masses. Good bowel sounds. Driveline site clean. Anchor in place.  Extremities: no cyanosis, clubbing, rash. Warm no edema  Neuro: alert & oriented x 3. No focal deficits. Moves all 4 without problem    ASSESSMENT AND PLAN:  .1. Chronic systolic CHF - Nonischemic CM, end-stage - EF 2012; 20-25%  - Echo  8/22: EF < 20%, severe LV dilation, moderate RV dysfunction.   - RHC (8/22): well compensated hemodynamics. - s/p S-ICD - S/p HM3 LVAD 10/17 (DT) - Doing well - NYHA I - Volume status ok. BP was very high today -> given clonidine 0.1 x1 in clinic - Continue Bidil 2 tabs tid - Continue spironolactone 25 mg daily  - Continue entresto to 97/130 bid - Again stressed need to continue weight loss efforts (see below). Will try to get him back on Ozempic.. Once BMI closer to 35 will refer to [facility] for begin transplant evaluation  Body mass index is 47.11 kg/m.  2. VAD  - s/p HM-3 placement 07/21/21  - VAD interrogated personally. Parameters stable.. - INR 4.0 Goal INR  2.0-3.0 Discussed dosing with PharmD personally. - LDH 161 - DL site improving with VASHE. Continue cefadroxil for suppression. Followed by ID.  - Hgb 13.7  3. DL site infection - Resolved.  - Continue cefadroxil for long-term suppression  4 Atrial fibrillation: Paroxysmal - Had severe AF with RVR 7/22 s/p DC-CV x 2 - Seen by EP. Not a candidate for ablation due to body habitus  (could consider AVN ablation and CRT, if needed)  - Had post-op AF with RVR post VAD - 10/23 S/P DC-CV with restoration of NSR. - Remains in NSR today. Continue amio 100 daily. - On warfarin. INR management as above  5. HTN - MAPs very elevated on arrival. Given clonidine 0.1 x 1 - Increase doxazosin to 6 bid  6. CKD stage 3B  - Has solitary kidney.   - Baseline creatinine is 1.5-1.8.  - Creatinine1.8 today   7. OSA - Continue CPAP   8. DM2  - On Jardiance. - Hgb A1c 9.4. -> 5.9 - PCP following - Continue Crestor 10  9. Polyarticular gout. - quiescent  10. Depression/anxiety - Continue Zoloft 150mg  daily - Much improved  11. Morbid obesity - Body mass index is 47.11 kg/m. - Continue GLP1RA  12.  Palpitations/dizziness - Zio 12/23 - 77 runs SVT. Continue amio. PVCs 1.9% PACs 4.7% - resolved  Total time spent 45 minutes. Over half that time spent discussing above.   Arvilla Meres, MD  3:08 PM

## 2023-08-30 ENCOUNTER — Other Ambulatory Visit: Payer: Self-pay | Admitting: Obstetrics and Gynecology

## 2023-08-30 NOTE — Patient Instructions (Signed)
Hi Mr. Kuklinski, sorry to miss you this morning, I hope you are doing well- as a part of your Medicaid benefit, you are eligible for care management and care coordination services at no cost or copay. I was unable to reach you by phone today but would be happy to help you with your health related needs. Please feel free to call me at [phone].  A member of the Managed Medicaid care management team will reach out to you again over the next 30 business  days.   Kathi Der RN, BSN   [employer] - Managed Medicaid High Risk [phone]

## 2023-08-30 NOTE — Patient Outreach (Signed)
  Medicaid Managed Care   Unsuccessful Attempt Note   08/30/2023 Name: Jeffrey [name] MRN: [id number] DOB: August 23, 1976  Referred by: Ivonne Andrew, NP Reason for referral : High Risk Managed Medicaid (Unsuccessful telephone outreach)  An unsuccessful telephone outreach was attempted today. The patient was referred to the case management team for assistance with care management and care coordination.    Follow Up Plan: The Managed Medicaid care management team will reach out to the patient again over the next 30 business  days. and The  Patient has been provided with contact information for the Managed Medicaid care management team and has been advised to call with any health related questions or concerns.   Kathi Der RN, BSN [place]  [employer] - Managed Medicaid High Risk [phone]

## 2023-09-01 ENCOUNTER — Other Ambulatory Visit: Payer: Self-pay

## 2023-09-01 ENCOUNTER — Ambulatory Visit (HOSPITAL_COMMUNITY)
Admission: RE | Admit: 2023-09-01 | Discharge: 2023-09-01 | Disposition: A | Discharge status: Home / Self Care | Payer: Medicaid Other | Source: Ambulatory Visit | Attending: Cardiology | Admitting: Cardiology

## 2023-09-01 ENCOUNTER — Ambulatory Visit (INDEPENDENT_AMBULATORY_CARE_PROVIDER_SITE_OTHER): Payer: Medicaid Other

## 2023-09-01 VITALS — BP 110/0

## 2023-09-01 DIAGNOSIS — Z0130 Encounter for examination of blood pressure without abnormal findings: Secondary | ICD-10-CM | POA: Insufficient documentation

## 2023-09-01 DIAGNOSIS — I10 Essential (primary) hypertension: Secondary | ICD-10-CM

## 2023-09-01 DIAGNOSIS — I959 Hypotension, unspecified: Secondary | ICD-10-CM | POA: Insufficient documentation

## 2023-09-01 DIAGNOSIS — I420 Dilated cardiomyopathy: Secondary | ICD-10-CM

## 2023-09-01 LAB — CUP PACEART REMOTE DEVICE CHECK
Battery Remaining Percentage: 74 %
Date Time Interrogation Session: 20241127113100
HighPow Impedance: 80 Ohm
Implantable Lead Connection Status: 753985
Implantable Lead Implant Date: 20160812
Implantable Lead Location: 753862
Implantable Lead Model: 3401
Implantable Pulse Generator Implant Date: 20210812
Pulse Gen Serial Number: 139514

## 2023-09-01 MED ORDER — DOXAZOSIN MESYLATE 4 MG PO TABS
8.0000 mg | ORAL_TABLET | Freq: Two times a day (BID) | ORAL | 6 refills | Status: DC
  Filled 2023-09-01: qty 120, 30d supply, fill #0
  Filled 2023-10-12: qty 120, 30d supply, fill #1
  Filled 2024-01-19: qty 120, 30d supply, fill #2
  Filled 2024-02-16: qty 120, 30d supply, fill #3
  Filled 2024-03-17: qty 120, 30d supply, fill #4
  Filled 2024-04-19 – 2024-04-21 (×2): qty 120, 30d supply, fill #5
  Filled 2024-06-23: qty 120, 30d supply, fill #6

## 2023-09-01 NOTE — Progress Notes (Signed)
Pt presents to clinic today for a BP check. Pts PCP office d/c many of his BP meds treating "hypotension". At pts last clinic visit he was nauseous and didn't feel well. His Doxazosin was increased at this time to 6 mg bid. He confirms that he made this change.  Vital Signs: Auto BP:116/94 (103) Doppler: 110  D/w above with Dr Gala Romney.  Plan: Increase Doxazosin to 8 mg bid Return to clinic at regular visit in January  Carlton Adam RN, BSN VAD Coordinator 24/7 Pager [phone]

## 2023-09-07 ENCOUNTER — Other Ambulatory Visit: Payer: Self-pay

## 2023-09-07 ENCOUNTER — Other Ambulatory Visit: Payer: Self-pay | Admitting: Pharmacist

## 2023-09-07 ENCOUNTER — Telehealth: Payer: Self-pay | Admitting: Pharmacist

## 2023-09-07 NOTE — Progress Notes (Signed)
   09/07/2023  Patient ID: Jeffrey [name], male   DOB: 07-13-1976, 47 y.o.   MRN: [id number]  Tried calling patient for follow-up regarding any medication cost concerns related to [facility] and Ozempic, as he was working on re-applying for Medicaid at last call. Unable to reach at this time or leave a voicemail requesting a call back.  Was able to get a hold of wife who plans to let him know I called and am requesting a call back.   Marlowe Aschoff, PharmD [facility] Phone Number: [phone]

## 2023-09-08 ENCOUNTER — Other Ambulatory Visit: Payer: Self-pay | Admitting: Obstetrics and Gynecology

## 2023-09-08 NOTE — Patient Outreach (Signed)
Medicaid Managed Care   Nurse Care Manager Note  09/08/2023 Name:  Jeffrey [name] MRN:  [id number] DOB:  [birth year]  Jeffrey [name] is an 47 y.o. year old male who is a primary patient of Jeffrey Andrew, NP.  The [name] Managed Care Coordination team was consulted for assistance with:    Chronic healthcare management needs, CHF, CKD, gout, HTN, DM, h/o renal Ca, OSA, VAD, DLD, HLD, depression  [name] was given information about Medicaid Managed Care Coordination team services today. Jeffrey [name] Patient agreed to services and verbal consent obtained.  Engaged with patient by telephone for follow up visit in response to provider referral for case management and/or care coordination services.   Assessments/Interventions:  Review of past medical history, allergies, medications, health status, including review of consultants reports, laboratory and other test data, was performed as part of comprehensive evaluation and provision of chronic care management services.  SDOH (Social Determinants of Health) assessments and interventions performed: SDOH Interventions    Flowsheet Row Patient Outreach Telephone from 09/08/2023 in [place] POPULATION HEALTH DEPARTMENT Patient Outreach Telephone from 06/30/2023 in [place] POPULATION HEALTH DEPARTMENT Patient Outreach Telephone from 04/29/2023 in [place] POPULATION HEALTH DEPARTMENT Patient Outreach Telephone from 12/29/2022 in [place] POPULATION HEALTH DEPARTMENT Patient Outreach Telephone from 12/08/2022 in [place] POPULATION HEALTH DEPARTMENT Patient Outreach Telephone from 10/30/2022 in [place] POPULATION HEALTH DEPARTMENT  SDOH Interventions        Food Insecurity Interventions -- -- Intervention Not Indicated -- -- --  Housing Interventions -- Intervention Not Indicated -- -- -- --  Transportation Interventions -- -- -- -- Intervention Not Indicated --  Utilities Interventions -- Intervention Not Indicated -- -- -- --  Alcohol Usage  Interventions -- -- -- Intervention Not Indicated (Score <7) -- --  Financial Strain Interventions Other (Comment)  [Medicaid approved, disability one time payment, to f/u BSW] -- -- -- -- Other (Comment)  [patient working with BSW]  Physical Activity Interventions -- -- -- -- Intervention Not Indicated --  Stress Interventions Other (Comment)  [food stamps and Medicaid now, Disability one time payment, patient will f/u] -- -- -- -- Other (Comment)  [SW following-concerns over wait for disability and bills due.]  Health Literacy Interventions -- -- Intervention Not Indicated -- -- --     Care Plan Allergies  Allergen Reactions   Aspirin Shortness Of Breath, Itching and Rash     Burning sensation (Patient reports he tolerates other NSAIDS)    Bee Venom Hives and Swelling   Lisinopril Cough   Tomato Hives   Medications Reviewed Today     Reviewed by Jeffrey Chandler, RN (Registered Nurse) on 09/08/23 at 1250  Med List Status: <None>   Medication Order Taking? Sig Documenting Provider Last Dose Status Informant  acetaminophen (TYLENOL) 325 MG tablet [id number] No Take 2 tablets (650 mg total) by mouth every 6 (six) hours as needed for fever. Jeffrey Hess, NP Taking Active Self, Pharmacy Records           Med Note Jeffrey [name], Jeffrey [name]   Thu Mar 18, 2023  2:30 PM) PRN   allopurinol (ZYLOPRIM) 100 MG tablet [id number] No Take 1 tablet (100 mg total) by mouth daily. Jeffrey [name], Jeffrey Buckles, MD Taking Active Self, Pharmacy Records  amiodarone (PACERONE) 200 MG tablet [id number] No Take 1 tablet (200 mg total) by mouth daily. Jeffrey [name], Jeffrey Buckles, MD Taking Active   amLODipine (NORVASC) 10 MG tablet [id number] No Take 1 tablet (  10 mg total) by mouth daily Jeffrey Andrew, NP Taking Active   Blood Pressure KIT [id number] No 1 Units by Does not apply route daily. Jeffrey Andrew, NP Taking Active   cefadroxil (DURICEF) 500 MG capsule [id number] No Take 1 capsule (500 mg total) by mouth 2 (two) times  daily. Jeffrey [name], Jeffrey Buckles, MD Taking Active   colchicine 0.6 MG tablet [id number] No Take 1 tablet (0.6 mg total) by mouth once daily. Jeffrey [name], Jeffrey Buckles, MD Taking Active   cyanocobalamin (VITAMIN B12) 1000 MCG tablet [id number] No Take 1 tablet (1,000 mcg total) by mouth daily. Jeffrey Andrew, NP Taking Active   doxazosin (CARDURA) 4 MG tablet [id number]  Take 2 tablets (8 mg total) by mouth 2 (two) times daily. Jeffrey [name], Jeffrey Buckles, MD  Active   ferrous sulfate 325 (65 FE) MG tablet [id number] No Take 1 tablet (325 mg total) by mouth daily before breakfast. Jeffrey Andrew, NP Taking Active   folic acid (FOLVITE) 800 MCG tablet [id number] No Take 0.5 tablets (400 mcg total) by mouth daily. Jeffrey Andrew, NP Taking Active   gabapentin (NEURONTIN) 400 MG capsule [id number] No Take 1 capsule (400 mg total) by mouth 3 (three) times daily. Jeffrey [name], Jeffrey Buckles, MD Taking Active   glucose blood test strip [id number] No Use as directed 4 times daily. Jeffrey Crook I, NP Taking Active Self, Pharmacy Records  isosorbide-hydrALAZINE (BIDIL) 20-37.5 MG tablet [id number] No Take 2 tablets by mouth 3 (three) times daily. Jeffrey [name], Jeffrey Buckles, MD Taking Active   meclizine (ANTIVERT) 25 MG tablet [id number] No Take 1 tablet (25 mg total) by mouth 3 (three) times daily as needed for dizziness. Jeffrey [name], Jeffrey Buckles, MD Taking Active Self, Pharmacy Records           Med Note [place], Desma Paganini May 05, 2023  9:08 AM) More dizziness/vertigo lately  metFORMIN (GLUCOPHAGE) 1000 MG tablet [id number] No Take 1 tablet (1,000 mg total) by mouth 2 (two) times daily with a meal. Jeffrey [name], Jeffrey Buckles, MD Taking Active   pantoprazole (PROTONIX) 40 MG tablet [id number] No Take 1 tablet (40 mg total) by mouth once daily. Jeffrey [name], Jeffrey Buckles, MD Taking Active   rosuvastatin (CRESTOR) 10 MG tablet [id number] No Take 1 tablet (10 mg total) by mouth daily. Jeffrey [name], Jeffrey Buckles, MD Taking Active   sacubitril-valsartan (ENTRESTO)  97-103 MG [id number] No Take 1 tablet by mouth 2 (two) times daily. Jeffrey [name], Jeffrey Buckles, MD Taking Active Self, Pharmacy Records  sertraline (ZOLOFT) 100 MG tablet [id number] No Take 1.5 tablets (150 mg total) by mouth daily. Jeffrey [name], Jeffrey Buckles, MD Taking Active   spironolactone (ALDACTONE) 25 MG tablet [id number] No Take 1 tablet (25 mg total) by mouth daily. Jeffrey [name], Jeffrey Buckles, MD Taking Active   traMADol HCl 100 MG TABS [id number] No Take 1 tablet (100 mg) by mouth every 6 (six) hours as needed for pain  Patient not taking: Reported on 08/27/2023   Jeffrey [name], Jeffrey Buckles, MD Not Taking Active   traZODone (DESYREL) 100 MG tablet [id number] No Take 1 tablet (100 mg total) by mouth at bedtime. Jeffrey [name], Jeffrey Buckles, MD Taking Active   warfarin (COUMADIN) 4 MG tablet [id number] No Take 2 tablet (8 mg) daily or as directed by HF clinic  Patient taking differently: Take 8 mg by mouth daily at 4 PM. 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/10/2022   Presence of left ventricular assist device (LVAD) (HCC) 07/25/2021   Acute on chronic systolic CHF (congestive heart failure) (HCC) 07/18/2021   Cardiogenic shock (HCC)    Atrial fibrillation with RVR (HCC) 04/30/2021   Acute hypoxemic respiratory failure (HCC)    Right wrist pain 04/16/2021   Absolute anemia    Rectal bleeding    Iron deficiency 12/25/2020   Long term current use of anticoagulant - apixaban 12/25/2020   Paroxysmal atrial fibrillation (HCC)    Lactic acidosis 11/22/2020   Hypotension 11/22/2020   Aspiration into airway    ICD (implantable cardioverter-defibrillator) battery depletion 05/15/2020   Endotracheal tube  present    Respiratory failure (HCC)    Acute encephalopathy    Acute pulmonary edema (HCC)    NICM (nonischemic cardiomyopathy) (HCC) 04/24/2020   S/P vasectomy 12/20/2019   Anxiety 12/20/2019   Hemoglobin A1C between 7% and 9% indicating borderline diabetic control (HCC) 12/20/2019   Seasonal allergies 12/20/2019   Insomnia 12/20/2019   Vasectomy evaluation 06/20/2019   Visual problems 06/20/2019   Blurry vision, bilateral 06/20/2019   Hyperglycemia 02/13/2019   Hyperosmolar (nonketotic) coma (HCC) 01/19/2019   AICD (automatic cardioverter/defibrillator) present 01/19/2019   Hyperlipidemia 12/07/2018   Follow-up exam 11/22/2018   Class 3 severe obesity due to excess calories with serious comorbidity and body mass index (BMI) of 45.0 to 49.9 in adult (HCC) 11/22/2018   Dizziness 11/22/2018   Lingular pneumonia 08/04/2018   Ventricular tachycardia (HCC) 12/30/2017   PAF (paroxysmal atrial fibrillation) (HCC)    Dilated cardiomyopathy (HCC)    Pollen allergy 02/17/2017   Dyslipidemia 02/17/2017   Hematochezia 11/08/2015   CKD (chronic kidney disease), stage III (HCC) 06/20/2015   Cardiac defibrillator in situ    Chronic systolic CHF (congestive heart failure) (HCC) 03/11/2015   Gouty arthritis 03/11/2015   Chest pain 12/28/2014   Acute renal failure superimposed on stage 3 chronic kidney disease (HCC) 04/08/2014   Aspirin allergy 04/08/2014   Renal cell carcinoma (HCC)    Gout attack 11/10/2011   Morbid obesity (HCC) 10/23/2011   OSA (obstructive sleep apnea) 10/23/2011   Type 2 diabetes mellitus with stage 3 chronic kidney disease, with long-term current use of insulin (HCC) 10/22/2011   Hyperlipidemia associated with type 2 diabetes mellitus (HCC) 12/25/2010   Hypertension associated with diabetes (HCC) 12/25/2010   Conditions to be addressed/monitored per PCP order:  Chronic healthcare management needs, CHF, CKD, gout, HTN, DM, h/o renal Ca, OSA, VAD, DLD, HLD,  depression  Care Plan : General Plan of Care (Adult)  Updates made by Jeffrey Chandler, RN since 09/08/2023 12:00 AM     Problem: Health Promotion or Disease Self-Management (General Plan of Care)   Priority: High  Onset Date: 01/29/2023  Note:   Current Barriers:  Chronic Disease Management support and education needs, S/P LVAD 07/21/21, DM, HTN, OSA, CHF, Afib, CKD, gout, DDD, HLD, depression,  h/o renal Ca 09/08/23:  patient doing well today, BP and BG stable.  Medicaid reinstated and has all meds now.  Dressing changes once a week.  One time disability payment received-to f/u with SW.  Nurse Case Manager Clinical Goal(s):  Over the next 90 days, patient will attend all scheduled medical appointments: Over the next 90 days, patient will have dental appt scheduled  Interventions:  Inter-disciplinary care team collaboration (see longitudinal plan of care) Collaborated with BSW  BSW referral for disability information-completed Evaluation of current treatment plan and patient's adherence to plan as established by provider. .  Reviewed medications with patient. Discussed plans with patient for ongoing care management follow up and provided patient with direct contact information for care management team Provided patient with educational materials related to exercise. Reviewed scheduled/upcoming provider appointments. Collaborated with Pharmacy Pharmacy referral for medication review  AFIB Interventions: (Status:  New goal.) Long Term Goal   Reviewed importance of adherence to anticoagulant exactly as prescribed Counseled on importance of regular laboratory monitoring as prescribed Afib action plan reviewed Assessed social determinant of health barriers  Heart Failure Interventions:  (Status:  New goal.) Long Term Goal Basic overview and discussion of pathophysiology of Heart Failure reviewed Discussed the importance of keeping all appointments with provider Assessed social  determinant of health barriers    Chronic Kidney Disease Interventions:  (Status:  New goal.) Long Term Goal Evaluation of current treatment plan related to chronic kidney disease self management and patient's adherence to plan as established by provider      Reviewed prescribed diet  Reviewed medications with patient and discussed importance of compliance    Reviewed scheduled/upcoming provider appointments  Assessed social determinant of health barriers    Last practice recorded BP readings:  BP Readings from Last 3 Encounters:  09/08/23  120/90     06/17/23  87/65  01/20/23         142/9  Most recent eGFR/CrCl: No results found for: "EGFR"  No components found for: "CRCL"  Diabetes Interventions:  (Status:  New goal.) Long Term Goal Assessed patient's understanding of A1c goal: <7% Reviewed medications with patient and discussed importance of medication adherence Counseled on importance of regular laboratory monitoring as prescribed Discussed plans with patient for ongoing care management follow up and provided patient with direct contact information for care management team Reviewed scheduled/upcoming provider appointments  Advised patient, providing education and rationale, to check cbg as directed and record, calling provider  for findings outside established parameters Review of patient status, including review of consultants reports, relevant laboratory and other test results, and medications completed Assessed social determinant of health barriers Lab Results  Component Value Date   HGBA1C 5.3 09/16/2022             HGBA1C                                                      5.9                                    01/20/2023            HGBA1C                                                      5.5  03/18/2023  Hyperlipidemia Interventions:  (Status:  New goal.) Long Term Goal Medication review performed; medication list updated in electronic  medical record.  Provider established cholesterol goals reviewed Counseled on importance of regular laboratory monitoring as prescribed Reviewed importance of limiting foods high in cholesterol Assessed social determinant of health barriers   Hypertension Interventions:  (Status:  New goal.) Long Term Goal Last practice recorded BP readings:  BP Readings from Last 3 Encounters:  09/08/23  120/90  06/17/23  87/65     03/18/23         108/84  Most recent eGFR/CrCl: No results found for: "EGFR"  No components found for: "CRCL"  Evaluation of current treatment plan related to hypertension self management and patient's adherence to plan as established by provider Reviewed medications with patient and discussed importance of compliance Discussed plans with patient for ongoing care management follow up and provided patient with direct contact information for care management team Advised patient, providing education and rationale, to monitor blood pressure daily and record, calling PCP for findings outside established parameters Reviewed scheduled/upcoming provider appointments  Assessed social determinant of health barriers   Patient Goals/Self-Care Activities Over the next 90 days, patient will:  -Attends all scheduled provider appointments Calls provider office for new concerns or questionss 09/08/23:  patient to f/u with SW regarding disability.  Follow Up Plan: The Managed Medicaid care management team will reach out to the patient again over the next 30 business  days.  The patient has been provided with contact information for the Managed Medicaid care management team and has been advised to call with any health related questions or concerns.    Follow Up:  Patient agrees to Care Plan and Follow-up.  Plan: The Managed Medicaid care management team will reach out to the patient again over the next 30 business  days. and The  Patient has been provided with contact information for the  Managed Medicaid care management team and has been advised to call with any health related questions or concerns.  Date/time of next scheduled RN care management/care coordination outreach: 10/11/23 at 0930

## 2023-09-08 NOTE — Patient Instructions (Signed)
Hi Mr. Yi you for the updates this afternoon.  I hope you have a good afternoon and double check with Social Work, Annice Pih, to see if she has any advice/recommendations regarding disability situation.  Mr. Hutton was given information about Medicaid Managed Care team care coordination services as a part of their Healthy [facility] Medicaid benefit. [name] verbally consented to engagement with the [facility] Managed Care team.   If you are experiencing a medical emergency, please call 911 or report to your local emergency department or urgent care.   If you have a non-emergency medical problem during routine business hours, please contact your provider's office and ask to speak with a nurse.   For questions related to your Healthy [facility] health plan, please call: [phone] or visit the homepage here: [email or web address]  If you would like to schedule transportation through your Healthy [facility] plan, please call the following number at least 2 days in advance of your appointment: [phone]  For information about your ride after you set it up, call Ride Assist at [phone]. Use this number to activate a Will Call pickup, or if your transportation is late for a scheduled pickup. Use this number, too, if you need to make a change or cancel a previously scheduled reservation.  If you need transportation services right away, call [phone]. The after-hours call center is staffed 24 hours to handle ride assistance and urgent reservation requests (including discharges) 365 days a year. Urgent trips include sick visits, hospital discharge requests and life-sustaining treatment.  Call the [facility] Line at [phone], at any time, 24 hours a day, 7 days a week. If you are in danger or need immediate medical attention call 911.  If you would like help to quit smoking, call 1-800-QUIT-NOW (647 003 2622) OR Espaol: 1-855-Djelo-Ya  (0-630-160-1093) o para ms informacin haga clic aqu or Text READY to 235-573 to register via text  Mr. Malott - following are the goals we discussed in your visit today:   Goals Addressed             This Visit's Progress    Protect My Health       Timeframe:  Long-Range Goal Priority:  High Start Date:    09/24/20                         Expected End Date:  ongoing           Follow up date: 10/11/23 - schedule recommended health tests  - schedule and keep appointment for annual check-up   09/08/23:  Upcoming PCP appt. To f/u with SW regarding disability.  Medicaid reinstated and has all meds now.   Patient verbalizes understanding of instructions and care plan provided today and agrees to view in MyChart. Active MyChart status and patient understanding of how to access instructions and care plan via MyChart confirmed with patient.     The Managed Medicaid care management team will reach out to the patient again over the next 30 business  days.  The  Patient  has been provided with contact information for the Managed Medicaid care management team and has been advised to call with any health related questions or concerns.   Kathi Der RN, BSN [place]  [facility] Care Management Coordinator - Managed Medicaid High Risk [phone]   Following is a copy of your plan of care:  Care Plan : General Plan of Care (Adult)  Updates made by  Danie Chandler, RN since 09/08/2023 12:00 AM     Problem: Health Promotion or Disease Self-Management (General Plan of Care)   Priority: High  Onset Date: 01/29/2023  Note:   Current Barriers:  Chronic Disease Management support and education needs, S/P LVAD 07/21/21, DM, HTN, OSA, CHF, Afib, CKD, gout, DDD, HLD, depression,  h/o renal Ca 09/08/23:  patient doing well today, BP and BG stable.  Medicaid reinstated and has all meds now.  Dressing changes once a week.  One time disability payment received-to f/u with SW.  Nurse Case  Manager Clinical Goal(s):  Over the next 90 days, patient will attend all scheduled medical appointments: Over the next 90 days, patient will have dental appt scheduled  Interventions:  Inter-disciplinary care team collaboration (see longitudinal plan of care) Collaborated with BSW  BSW referral for disability information-completed Evaluation of current treatment plan and patient's adherence to plan as established by provider. .  Reviewed medications with patient. Discussed plans with patient for ongoing care management follow up and provided patient with direct contact information for care management team Provided patient with educational materials related to exercise. Reviewed scheduled/upcoming provider appointments. Collaborated with Pharmacy Pharmacy referral for medication review  AFIB Interventions: (Status:  New goal.) Long Term Goal   Reviewed importance of adherence to anticoagulant exactly as prescribed Counseled on importance of regular laboratory monitoring as prescribed Afib action plan reviewed Assessed social determinant of health barriers  Heart Failure Interventions:  (Status:  New goal.) Long Term Goal Basic overview and discussion of pathophysiology of Heart Failure reviewed Discussed the importance of keeping all appointments with provider Assessed social determinant of health barriers    Chronic Kidney Disease Interventions:  (Status:  New goal.) Long Term Goal Evaluation of current treatment plan related to chronic kidney disease self management and patient's adherence to plan as established by provider      Reviewed prescribed diet  Reviewed medications with patient and discussed importance of compliance    Reviewed scheduled/upcoming provider appointments  Assessed social determinant of health barriers    Last practice recorded BP readings:  BP Readings from Last 3 Encounters:  09/08/23  120/90     06/17/23  87/65  01/20/23         142/9  Most recent  eGFR/CrCl: No results found for: "EGFR"  No components found for: "CRCL"  Diabetes Interventions:  (Status:  New goal.) Long Term Goal Assessed patient's understanding of A1c goal: <7% Reviewed medications with patient and discussed importance of medication adherence Counseled on importance of regular laboratory monitoring as prescribed Discussed plans with patient for ongoing care management follow up and provided patient with direct contact information for care management team Reviewed scheduled/upcoming provider appointments  Advised patient, providing education and rationale, to check cbg as directed and record, calling provider  for findings outside established parameters Review of patient status, including review of consultants reports, relevant laboratory and other test results, and medications completed Assessed social determinant of health barriers Lab Results  Component Value Date   HGBA1C 5.3 09/16/2022             HGBA1C                                                      5.9  01/20/2023            HGBA1C                                                      5.5                                    03/18/2023  Hyperlipidemia Interventions:  (Status:  New goal.) Long Term Goal Medication review performed; medication list updated in electronic medical record.  Provider established cholesterol goals reviewed Counseled on importance of regular laboratory monitoring as prescribed Reviewed importance of limiting foods high in cholesterol Assessed social determinant of health barriers   Hypertension Interventions:  (Status:  New goal.) Long Term Goal Last practice recorded BP readings:  BP Readings from Last 3 Encounters:  09/08/23  120/90  06/17/23  87/65     03/18/23         108/84  Most recent eGFR/CrCl: No results found for: "EGFR"  No components found for: "CRCL"  Evaluation of current treatment plan related to hypertension self management  and patient's adherence to plan as established by provider Reviewed medications with patient and discussed importance of compliance Discussed plans with patient for ongoing care management follow up and provided patient with direct contact information for care management team Advised patient, providing education and rationale, to monitor blood pressure daily and record, calling PCP for findings outside established parameters Reviewed scheduled/upcoming provider appointments  Assessed social determinant of health barriers   Patient Goals/Self-Care Activities Over the next 90 days, patient will:  -Attends all scheduled provider appointments Calls provider office for new concerns or questionss 09/08/23:  patient to f/u with SW regarding disability.  Follow Up Plan: The Managed Medicaid care management team will reach out to the patient again over the next 30 business  days.  The patient has been provided with contact information for the Managed Medicaid care management team and has been advised to call with any health related questions or concerns.

## 2023-09-10 ENCOUNTER — Other Ambulatory Visit (HOSPITAL_COMMUNITY): Payer: Self-pay

## 2023-09-10 DIAGNOSIS — Z7901 Long term (current) use of anticoagulants: Secondary | ICD-10-CM

## 2023-09-10 DIAGNOSIS — Z95811 Presence of heart assist device: Secondary | ICD-10-CM

## 2023-09-16 ENCOUNTER — Ambulatory Visit (HOSPITAL_COMMUNITY)
Admission: RE | Admit: 2023-09-16 | Discharge: 2023-09-16 | Disposition: A | Discharge status: Home / Self Care | Payer: Medicaid Other | Source: Ambulatory Visit | Attending: Cardiology | Admitting: Cardiology

## 2023-09-16 ENCOUNTER — Ambulatory Visit (HOSPITAL_COMMUNITY): Payer: Self-pay | Admitting: Pharmacist

## 2023-09-16 ENCOUNTER — Other Ambulatory Visit: Payer: Self-pay

## 2023-09-16 DIAGNOSIS — Z95811 Presence of heart assist device: Secondary | ICD-10-CM | POA: Insufficient documentation

## 2023-09-16 DIAGNOSIS — Z7901 Long term (current) use of anticoagulants: Secondary | ICD-10-CM | POA: Diagnosis not present

## 2023-09-16 LAB — PROTIME-INR
INR: 2.3 — ABNORMAL HIGH (ref 0.8–1.2)
Prothrombin Time: 25.9 s — ABNORMAL HIGH (ref 11.4–15.2)

## 2023-09-17 ENCOUNTER — Other Ambulatory Visit: Payer: Self-pay

## 2023-09-17 ENCOUNTER — Other Ambulatory Visit (HOSPITAL_COMMUNITY): Payer: Self-pay | Admitting: *Deleted

## 2023-09-17 DIAGNOSIS — Z7901 Long term (current) use of anticoagulants: Secondary | ICD-10-CM

## 2023-09-17 DIAGNOSIS — Z95811 Presence of heart assist device: Secondary | ICD-10-CM

## 2023-09-18 DIAGNOSIS — M62830 Muscle spasm of back: Secondary | ICD-10-CM | POA: Diagnosis not present

## 2023-09-20 ENCOUNTER — Other Ambulatory Visit: Payer: Self-pay

## 2023-09-20 MED ORDER — CYCLOBENZAPRINE HCL 5 MG PO TABS
5.0000 mg | ORAL_TABLET | Freq: Three times a day (TID) | ORAL | 0 refills | Status: AC | PRN
  Filled 2023-09-20 – 2024-08-02 (×2): qty 15, 5d supply, fill #0

## 2023-09-22 ENCOUNTER — Ambulatory Visit: Payer: Self-pay | Admitting: Nurse Practitioner

## 2023-10-01 ENCOUNTER — Other Ambulatory Visit (HOSPITAL_COMMUNITY): Payer: Self-pay | Admitting: *Deleted

## 2023-10-01 ENCOUNTER — Other Ambulatory Visit (HOSPITAL_COMMUNITY): Payer: Self-pay | Admitting: Internal Medicine

## 2023-10-01 ENCOUNTER — Other Ambulatory Visit: Payer: Self-pay

## 2023-10-01 DIAGNOSIS — R739 Hyperglycemia, unspecified: Secondary | ICD-10-CM

## 2023-10-01 DIAGNOSIS — E119 Type 2 diabetes mellitus without complications: Secondary | ICD-10-CM

## 2023-10-01 MED ORDER — METFORMIN HCL 1000 MG PO TABS
1000.0000 mg | ORAL_TABLET | Freq: Two times a day (BID) | ORAL | 11 refills | Status: DC
  Filled 2023-10-01 – 2023-10-14 (×2): qty 60, 30d supply, fill #0
  Filled 2023-11-11: qty 60, 30d supply, fill #1
  Filled 2023-12-18: qty 60, 30d supply, fill #2

## 2023-10-07 ENCOUNTER — Ambulatory Visit: Payer: Medicaid Other | Admitting: Nurse Practitioner

## 2023-10-07 ENCOUNTER — Encounter: Payer: Self-pay | Admitting: Nurse Practitioner

## 2023-10-07 VITALS — BP 98/72 | HR 77 | Temp 97.2°F | Wt 308.8 lb

## 2023-10-07 DIAGNOSIS — N1831 Chronic kidney disease, stage 3a: Secondary | ICD-10-CM

## 2023-10-07 DIAGNOSIS — Z794 Long term (current) use of insulin: Secondary | ICD-10-CM

## 2023-10-07 DIAGNOSIS — E1122 Type 2 diabetes mellitus with diabetic chronic kidney disease: Secondary | ICD-10-CM | POA: Diagnosis not present

## 2023-10-07 LAB — POCT GLYCOSYLATED HEMOGLOBIN (HGB A1C): Hemoglobin A1C: 5.7 % — AB (ref 4.0–5.6)

## 2023-10-07 NOTE — Patient Instructions (Signed)
 1. Type 2 diabetes mellitus with stage 3a chronic kidney disease, with long-term current use of insulin  (HCC) (Primary)  - POCT glycosylated hemoglobin (Hb A1C) - Ambulatory referral to Nephrology - AMB Referral VBCI Care Management   Follow up:  Follow up in 3 months

## 2023-10-07 NOTE — Progress Notes (Signed)
 Subjective   Patient ID: Jeffrey [name], male    DOB: [birth year], 48 y.o.   MRN: [id number]  Chief Complaint  Patient presents with   Diabetes    3 month follow up    Referring provider: Oley Bascom RAMAN, NP  Jeffrey [name] is a 48 y.o. male with Past Medical History: No date: Anxiety No date: Aspirin  allergy No date: Childhood asthma No date: Chronic systolic CHF (congestive heart failure) (HCC)     Comment:  a. EF 20-25% in 2012. b. EF 45-50% in 10/2011 with               nonischemic nuc - presumed NICM. c. 12/2014 Echo: Sev               depressed LV fxn, sev dil LV, mild LVH, mild MR, sev dil               LA, mildly reduced RV fxn. No date: CKD (chronic kidney disease) stage 2, GFR 60-89 ml/min 12/2019: H/O vasectomy No date: High cholesterol No date: Hypertension No date: Morbid obesity (HCC) No date: Nephrolithiasis No date: OSA on CPAP No date: Paroxysmal atrial fibrillation (HCC) No date: Presumed NICM     Comment:  a. 04/2014 Myoview : EF 26%, glob HK, sev glob HK, ? prior              infarct;  b. Never cathed 2/2 CKD. No date: Renal cell carcinoma (HCC)     Comment:  a. s/p Rt robotic assisted partial converted to radical               nephrectomy on 01/2013. No date: Troponin level elevated     Comment:  a. 04/2014, 12/2014: felt due to CHF. No date: Type II diabetes mellitus (HCC) No date: Ventricular tachycardia (HCC)     Comment:  a. appropriate ICD therapy 12/2017   HPI  Diabetes Mellitus:   Patient presents for follow up of diabetes. Symptoms: none. Denies any symptoms. Patient denies foot ulcerations, hypoglycemia , and nausea.  Evaluation to date has been included: hemoglobin A1C.  Home sugars: patient does not check sugars. Treatment to date: no recent interventions. A1C is 5.7. Is requesting to start on ozempic  for weight loss - will refer to pharmacy for medication assistance.      Hypertension:   Patient here for follow-up of elevated blood pressure.  Patient is trying to loose weight for possible heart transplant. Cardiac symptoms none. Patient denies claudication, dyspnea, fatigue, and near-syncope.  Cardiovascular risk factors: hypertension, male gender, and obesity (BMI >= 30 kg/m2). Use of agents associated with hypertension: none.  Blood pressures have been low recently.  We will decrease dosage of amlodipine  and consult with pharmacy.  We have sent a message to cardiology as well.  Patient did have a low hemoglobin at recent lab checks through cardiology.  He was advised to start iron supplements.  He states that he has not done this.  We will start him on vitamin B12 which was low also, folic acid , iron supplement. Was previously seen by nephrology - has not followed up - will place referral back to nephrology.      Denies f/c/s, n/v/d, hemoptysis, PND, leg swelling Denies chest pain or edema    Allergies  Allergen Reactions   Aspirin  Shortness Of Breath, Itching and Rash     Burning sensation (Patient reports he tolerates other NSAIDS)    Bee Venom Hives and Swelling  Lisinopril  Cough and Other (See Comments)   Tomato Hives    Immunization History  Administered Date(s) Administered   Influenza Split 10/24/2011   Influenza,inj,Quad PF,6+ Mos 07/23/2017, 09/06/2018, 09/18/2019   Pneumococcal Conjugate-13 11/01/2017   Pneumococcal Polysaccharide-23 10/24/2011   Tdap 04/07/2015    Tobacco History: Social History   Tobacco Use  Smoking Status Former   Current packs/day: 0.00   Average packs/day: 0.3 packs/day for 22.0 years (5.5 ttl pk-yrs)   Types: Cigarettes   Start date: 04/29/1993   Quit date: 04/30/2015   Years since quitting: 8.4  Smokeless Tobacco Never   Counseling given: Not Answered   Outpatient Encounter Medications as of 10/07/2023  Medication Sig   acetaminophen  (TYLENOL ) 325 MG tablet Take 2 tablets (650 mg total) by mouth every 6 (six) hours as needed for fever.   allopurinol  (ZYLOPRIM ) 100 MG tablet  Take 1 tablet (100 mg total) by mouth daily.   amiodarone  (PACERONE ) 200 MG tablet Take 1 tablet (200 mg total) by mouth daily.   amLODipine  (NORVASC ) 10 MG tablet Take 1 tablet (10 mg total) by mouth daily   Blood Pressure KIT 1 Units by Does not apply route daily.   cefadroxil  (DURICEF) 500 MG capsule Take 1 capsule (500 mg total) by mouth 2 (two) times daily.   colchicine  0.6 MG tablet Take 1 tablet (0.6 mg total) by mouth once daily.   cyanocobalamin  (VITAMIN B12) 1000 MCG tablet Take 1 tablet (1,000 mcg total) by mouth daily.   cyclobenzaprine  (FLEXERIL ) 5 MG tablet Take 1 tablet (5 mg total) by mouth 3 (three) times daily as needed for muscle spasms   doxazosin  (CARDURA ) 4 MG tablet Take 2 tablets (8 mg total) by mouth 2 (two) times daily.   ferrous sulfate  325 (65 FE) MG tablet Take 1 tablet (325 mg total) by mouth daily before breakfast.   folic acid  (FOLVITE ) 800 MCG tablet Take 0.5 tablets (400 mcg total) by mouth daily.   gabapentin  (NEURONTIN ) 400 MG capsule Take 1 capsule (400 mg total) by mouth 3 (three) times daily.   glucose blood test strip Use as directed 4 times daily.   isosorbide -hydrALAZINE  (BIDIL ) 20-37.5 MG tablet Take 2 tablets by mouth 3 (three) times daily.   meclizine  (ANTIVERT ) 25 MG tablet Take 1 tablet (25 mg total) by mouth 3 (three) times daily as needed for dizziness.   metFORMIN  (GLUCOPHAGE ) 1000 MG tablet Take 1 tablet (1,000 mg total) by mouth 2 (two) times daily with a meal.   pantoprazole  (PROTONIX ) 40 MG tablet Take 1 tablet (40 mg total) by mouth once daily.   rosuvastatin  (CRESTOR ) 10 MG tablet Take 1 tablet (10 mg total) by mouth daily.   sacubitril -valsartan  (ENTRESTO ) 97-103 MG Take 1 tablet by mouth 2 (two) times daily.   sertraline  (ZOLOFT ) 100 MG tablet Take 1.5 tablets (150 mg total) by mouth daily.   spironolactone  (ALDACTONE ) 25 MG tablet Take 1 tablet (25 mg total) by mouth daily.   traMADol  HCl 100 MG TABS Take 1 tablet (100 mg) by mouth every  6 (six) hours as needed for pain   traZODone  (DESYREL ) 100 MG tablet Take 1 tablet (100 mg total) by mouth at bedtime.   warfarin (COUMADIN ) 4 MG tablet Take 2 tablet (8 mg) daily or as directed by HF clinic (Patient taking differently: Take 8 mg by mouth daily at 4 PM. Take 3 tablets (12 mg) on Tuesdays, and 2 tablet (8 mg) all other days, or as directed by HF clinic)  No facility-administered encounter medications on file as of 10/07/2023.    Review of Systems  Review of Systems  Constitutional: Negative.   HENT: Negative.    Cardiovascular: Negative.   Gastrointestinal: Negative.   Allergic/Immunologic: Negative.   Neurological: Negative.   Psychiatric/Behavioral: Negative.       Objective:   BP 98/72   Pulse 77   Temp (!) 97.2 F (36.2 C)   Wt (!) 308 lb 12.8 oz (140.1 kg)   SpO2 96%   BMI 48.36 kg/m   Wt Readings from Last 5 Encounters:  10/07/23 (!) 308 lb 12.8 oz (140.1 kg)  08/27/23 (!) 300 lb 12.8 oz (136.4 kg)  06/30/23 290 lb (131.5 kg)  06/30/23 290 lb (131.5 kg)  06/17/23 296 lb (134.3 kg)     Physical Exam Vitals and nursing note reviewed.  Constitutional:      General: He is not in acute distress.    Appearance: He is well-developed.  Cardiovascular:     Rate and Rhythm: Normal rate and regular rhythm.  Pulmonary:     Effort: Pulmonary effort is normal.     Breath sounds: Normal breath sounds.  Skin:    General: Skin is warm and dry.  Neurological:     Mental Status: He is alert and oriented to person, place, and time.       Assessment & Plan:   Type 2 diabetes mellitus with stage 3a chronic kidney disease, with long-term current use of insulin  (HCC) -     POCT glycosylated hemoglobin (Hb A1C) -     Ambulatory referral to Nephrology -     AMB Referral VBCI Care Management     Return in about 3 months (around 01/05/2024).       Bascom GORMAN Borer, NP 10/07/2023

## 2023-10-08 ENCOUNTER — Ambulatory Visit (HOSPITAL_COMMUNITY)
Admission: RE | Admit: 2023-10-08 | Discharge: 2023-10-08 | Disposition: A | Discharge status: Home / Self Care | Payer: Medicaid Other | Source: Ambulatory Visit | Attending: Cardiology | Admitting: Cardiology

## 2023-10-08 ENCOUNTER — Ambulatory Visit (HOSPITAL_COMMUNITY): Payer: Self-pay | Admitting: Pharmacist

## 2023-10-08 DIAGNOSIS — Z7901 Long term (current) use of anticoagulants: Secondary | ICD-10-CM | POA: Insufficient documentation

## 2023-10-08 DIAGNOSIS — Z95811 Presence of heart assist device: Secondary | ICD-10-CM | POA: Diagnosis not present

## 2023-10-08 LAB — PROTIME-INR
INR: 1.7 — ABNORMAL HIGH (ref 0.8–1.2)
Prothrombin Time: 20 s — ABNORMAL HIGH (ref 11.4–15.2)

## 2023-10-11 ENCOUNTER — Other Ambulatory Visit: Payer: Self-pay | Admitting: Obstetrics and Gynecology

## 2023-10-11 NOTE — Patient Outreach (Signed)
  Medicaid Managed Care   Unsuccessful Attempt Note   10/11/2023 Name: Jeffrey [name] MRN: [id number] DOB: 07-05-1976  Referred by: Oley Bascom RAMAN, NP Reason for referral : High Risk Managed Medicaid (Unsuccessful telephone outreach)  An unsuccessful telephone outreach was attempted today. The patient was referred to the case management team for assistance with care management and care coordination.    Follow Up Plan: The Managed Medicaid care management team will reach out to the patient again over the next 30 business  days. and The  Patient has been provided with contact information for the Managed Medicaid care management team and has been advised to call with any health related questions or concerns.   Veva Angles RN, BSN [place]  [facility] [employer] - Managed Medicaid High Risk [phone]

## 2023-10-11 NOTE — Patient Instructions (Signed)
 Hi Mr. Fomby New Year!  I hope you are well and sorry to miss you this morning- as a part of your Medicaid benefit, you are eligible for care management and care coordination services at no cost or copay. I was unable to reach you by phone today but would be happy to help you with your health related needs. Please feel free to call me at [phone].   A member of the Managed Medicaid care management team will reach out to you again over the next 30 business days.   Veva Angles RN, BSN [place]  [facility] - Managed Medicaid High Risk [phone]

## 2023-10-13 ENCOUNTER — Other Ambulatory Visit (HOSPITAL_COMMUNITY): Payer: Self-pay

## 2023-10-13 ENCOUNTER — Other Ambulatory Visit: Payer: Self-pay

## 2023-10-13 DIAGNOSIS — Z95811 Presence of heart assist device: Secondary | ICD-10-CM

## 2023-10-13 DIAGNOSIS — Z7901 Long term (current) use of anticoagulants: Secondary | ICD-10-CM

## 2023-10-14 ENCOUNTER — Other Ambulatory Visit: Payer: Self-pay

## 2023-10-18 ENCOUNTER — Ambulatory Visit: Payer: Medicaid Other | Admitting: Internal Medicine

## 2023-10-18 ENCOUNTER — Telehealth: Payer: Self-pay | Admitting: Internal Medicine

## 2023-10-18 ENCOUNTER — Other Ambulatory Visit: Payer: Self-pay | Admitting: Obstetrics and Gynecology

## 2023-10-18 NOTE — Telephone Encounter (Signed)
 PT is scheduled for an appointment today at 1110am and has a fever and cough. Has rescheduled appointment to a later date.

## 2023-10-18 NOTE — Patient Outreach (Addendum)
 Care Coordination  10/18/2023  Jeffrey [name] 21-Jan-1976 [id number]   RNCM called patient at scheduled time.  Patient answered phone and stated he was sick-requested to be called another time.  RNCM rescheduled appointment at patient request.  Veva Angles [facility] RN Care Manager Direct Dial [phone]

## 2023-10-21 ENCOUNTER — Other Ambulatory Visit (HOSPITAL_COMMUNITY): Payer: Self-pay

## 2023-10-21 ENCOUNTER — Ambulatory Visit (HOSPITAL_COMMUNITY)
Admission: RE | Admit: 2023-10-21 | Discharge: 2023-10-21 | Disposition: A | Discharge status: Home / Self Care | Payer: Medicaid Other | Source: Ambulatory Visit | Attending: Cardiology | Admitting: Cardiology

## 2023-10-21 ENCOUNTER — Other Ambulatory Visit: Payer: Self-pay

## 2023-10-21 ENCOUNTER — Ambulatory Visit (HOSPITAL_COMMUNITY): Payer: Self-pay | Admitting: Pharmacist

## 2023-10-21 VITALS — BP 144/127 | HR 75 | Temp 98.4°F

## 2023-10-21 DIAGNOSIS — M109 Gout, unspecified: Secondary | ICD-10-CM | POA: Insufficient documentation

## 2023-10-21 DIAGNOSIS — I5022 Chronic systolic (congestive) heart failure: Secondary | ICD-10-CM | POA: Diagnosis not present

## 2023-10-21 DIAGNOSIS — I428 Other cardiomyopathies: Secondary | ICD-10-CM | POA: Insufficient documentation

## 2023-10-21 DIAGNOSIS — R0989 Other specified symptoms and signs involving the circulatory and respiratory systems: Secondary | ICD-10-CM

## 2023-10-21 DIAGNOSIS — Z95811 Presence of heart assist device: Secondary | ICD-10-CM | POA: Diagnosis not present

## 2023-10-21 DIAGNOSIS — N1832 Chronic kidney disease, stage 3b: Secondary | ICD-10-CM | POA: Insufficient documentation

## 2023-10-21 DIAGNOSIS — I480 Paroxysmal atrial fibrillation: Secondary | ICD-10-CM | POA: Insufficient documentation

## 2023-10-21 DIAGNOSIS — R1110 Vomiting, unspecified: Secondary | ICD-10-CM | POA: Diagnosis not present

## 2023-10-21 DIAGNOSIS — F17200 Nicotine dependence, unspecified, uncomplicated: Secondary | ICD-10-CM | POA: Diagnosis not present

## 2023-10-21 DIAGNOSIS — I493 Ventricular premature depolarization: Secondary | ICD-10-CM | POA: Insufficient documentation

## 2023-10-21 DIAGNOSIS — Z79899 Other long term (current) drug therapy: Secondary | ICD-10-CM | POA: Diagnosis not present

## 2023-10-21 DIAGNOSIS — E1122 Type 2 diabetes mellitus with diabetic chronic kidney disease: Secondary | ICD-10-CM | POA: Insufficient documentation

## 2023-10-21 DIAGNOSIS — F419 Anxiety disorder, unspecified: Secondary | ICD-10-CM | POA: Diagnosis not present

## 2023-10-21 DIAGNOSIS — Z85528 Personal history of other malignant neoplasm of kidney: Secondary | ICD-10-CM | POA: Diagnosis not present

## 2023-10-21 DIAGNOSIS — Z7984 Long term (current) use of oral hypoglycemic drugs: Secondary | ICD-10-CM | POA: Diagnosis not present

## 2023-10-21 DIAGNOSIS — Z7901 Long term (current) use of anticoagulants: Secondary | ICD-10-CM | POA: Diagnosis not present

## 2023-10-21 DIAGNOSIS — G4733 Obstructive sleep apnea (adult) (pediatric): Secondary | ICD-10-CM | POA: Diagnosis not present

## 2023-10-21 DIAGNOSIS — E7800 Pure hypercholesterolemia, unspecified: Secondary | ICD-10-CM | POA: Diagnosis not present

## 2023-10-21 DIAGNOSIS — R6883 Chills (without fever): Secondary | ICD-10-CM | POA: Diagnosis not present

## 2023-10-21 DIAGNOSIS — Z905 Acquired absence of kidney: Secondary | ICD-10-CM | POA: Insufficient documentation

## 2023-10-21 DIAGNOSIS — R197 Diarrhea, unspecified: Secondary | ICD-10-CM | POA: Insufficient documentation

## 2023-10-21 DIAGNOSIS — I130 Hypertensive heart and chronic kidney disease with heart failure and stage 1 through stage 4 chronic kidney disease, or unspecified chronic kidney disease: Secondary | ICD-10-CM | POA: Diagnosis not present

## 2023-10-21 DIAGNOSIS — F32A Depression, unspecified: Secondary | ICD-10-CM | POA: Insufficient documentation

## 2023-10-21 DIAGNOSIS — R002 Palpitations: Secondary | ICD-10-CM | POA: Insufficient documentation

## 2023-10-21 DIAGNOSIS — I471 Supraventricular tachycardia, unspecified: Secondary | ICD-10-CM | POA: Insufficient documentation

## 2023-10-21 LAB — CBC
HCT: 42.9 % (ref 39.0–52.0)
Hemoglobin: 13.6 g/dL (ref 13.0–17.0)
MCH: 25.7 pg — ABNORMAL LOW (ref 26.0–34.0)
MCHC: 31.7 g/dL (ref 30.0–36.0)
MCV: 81.1 fL (ref 80.0–100.0)
Platelets: 186 10*3/uL (ref 150–400)
RBC: 5.29 MIL/uL (ref 4.22–5.81)
RDW: 20 % — ABNORMAL HIGH (ref 11.5–15.5)
WBC: 5 10*3/uL (ref 4.0–10.5)
nRBC: 0 % (ref 0.0–0.2)

## 2023-10-21 LAB — BASIC METABOLIC PANEL
Anion gap: 8 (ref 5–15)
BUN: 23 mg/dL — ABNORMAL HIGH (ref 6–20)
CO2: 21 mmol/L — ABNORMAL LOW (ref 22–32)
Calcium: 10 mg/dL (ref 8.9–10.3)
Chloride: 110 mmol/L (ref 98–111)
Creatinine, Ser: 1.61 mg/dL — ABNORMAL HIGH (ref 0.61–1.24)
GFR, Estimated: 52 mL/min — ABNORMAL LOW (ref >60)
Glucose, Bld: 103 mg/dL — ABNORMAL HIGH (ref 70–99)
Potassium: 4 mmol/L (ref 3.5–5.1)
Sodium: 139 mmol/L (ref 135–145)

## 2023-10-21 LAB — RESPIRATORY PANEL BY PCR

## 2023-10-21 LAB — PROTIME-INR
INR: 2.5 — ABNORMAL HIGH (ref 0.8–1.2)
Prothrombin Time: 27.4 s — ABNORMAL HIGH (ref 11.4–15.2)

## 2023-10-21 MED ORDER — ONDANSETRON 4 MG PO TBDP
4.0000 mg | ORAL_TABLET | Freq: Three times a day (TID) | ORAL | 0 refills | Status: AC | PRN
  Filled 2023-10-21: qty 5, 2d supply, fill #0

## 2023-10-21 NOTE — Addendum Note (Signed)
Encounter addended by: Lebron Quam, RN on: 10/21/2023 2:52 PM  Actions taken: Vitals modified, Clinical Note Signed

## 2023-10-21 NOTE — Addendum Note (Signed)
Encounter addended by: Flora Lipps, RN on: 10/21/2023 12:42 PM  Actions taken: Order Reconciliation Section accessed, Home Medications modified, Medication List reviewed, Visit diagnoses modified, Order list changed, Diagnosis association updated

## 2023-10-21 NOTE — Addendum Note (Signed)
Encounter addended by: Lebron Quam, RN on: 10/21/2023 12:50 PM  Actions taken: Pharmacy for encounter modified, Order list changed

## 2023-10-21 NOTE — Progress Notes (Signed)
Patient presents for INR today alone. Reports no problem with VAD or equipment. Pt ask phlebotomist if he can be seen as he is not feeling well with respiratory/GI illness.  Pt states he has been sick since Tuesday with N/V, diarrhea, congestion, HA and dizziness. Pt states that his kids have been sick with fever and cough/congestion.  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   Pt reports his wife is doing weekly dressing changes. Pt states that some days he walks and some days he doesn't.  Pts BP is up today. He states that he has not taken any of his medications today. Pt states his medications are in the car. He plans to take them as soon as he is finished with [place] in clinic.   Resp panel negative.  Vital Signs:  Doppler Pressure: 130 Automatic BP: 144/127 (134) HR: 75 SPO2: 96% on RA   Weight: 300.8;bs lb w/ eqt Last weight:  296 lbs w/eqt   VAD Indication: Destination Therapy due to smoking and BMI   VAD interrogation & Equipment Management: Speed: 6200 Flow: 5.1 Power: 5.2 w    PI: rare Hct: 38   Alarms: few LV alarms Events: rare  Fixed speed 6200 Low speed limit: 5900   Primary Controller: Replace back up battery in 32 mths Back up controller:  Replace back up battery in 10 months.   Annual Equipment Maintenance on UBC/PM was performed on 06/17/23.    I reviewed the LVAD parameters from today and compared the results to the patient's prior recorded data. LVAD interrogation was NEGATIVE for significant power changes, NEGATIVE for clinical alarms and STABLE for PI events/speed drops. No programming changes were made and pump is functioning within specified parameters. Pt is performing daily controller and system monitor self tests along with completing weekly and monthly maintenance for LVAD equipment.   LVAD equipment check completed and is in good working order. Back-up equipment not present.    Exit Site  Care: Drive line being maintained weekly by pt's wife. Existing dressing CDI. Pt has sufficient kits at home  Device:  Left subcutaneous dual ICD Therapies: on at 250 BPM Last check: 03/04/22  BP & Labs:  Doppler 130 - Doppler is reflecting MAP-pt has not taken any medications today   Hgb 13.6 - No S/S of bleeding. Specifically denies melena/BRBPR or nosebleeds.   LDH stable at 161 with established baseline of 160 - 300. Denies tea-colored urine. No power elevations noted on interrogation.   Plan:   Please take your morning medications. No change in medications. Stay hydrated Take Zofran 4 mg as needed for Nausea Coumadin dosing per [facility] Return to VAD Clinic in 1 month for your regular scheduled appt  Carlton Adam RN,BSN VAD Coordinator  Office: [phone]  24/7 Pager: [phone]

## 2023-10-21 NOTE — Progress Notes (Signed)
VAD CLINIC Follow-up Visit  PCP: Ivonne Andrew, NP HF MD: DB   HPI:  Jeffrey [name] is a 48 yo male with a history of  poorly controlled HTN, R renal cell carcinoma s/p nephrectomy, CKD IIIa, DM2, OSA, gout, morbid obesity and systolic HF due to NICM. 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   Here for sick visit. Reports fever, chills, diarrhea, and vomiting since Tuesday. He also has felt dizzy and lightheaded. He has been able to keep food down today including some vegetable broth. He notes that he did not take his BP medications this morning, doppler MAP of 130. Children were sick this week and got better in a day, they were tested and reportedly did not have the flu.    VAD Indication: Destination Therapy due to smoking and BMI   VAD interrogation & Equipment Management: Speed: 6200 Flow: 4.9 Power: 5 PI: 2.7 Hct: 42   Alarms: None Events: Rare PI events  Fixed speed 6200 Low speed limit: 5900   Primary Controller: Back up battery replaced at last visit, TK:[id number] Back up controller:  Replace back up battery in 10 months.   Annual Equipment Maintenance on UBC/PM was performed on 06/17/23.    I reviewed the LVAD parameters from today and compared the results to the patient's prior recorded data. LVAD interrogation was NEGATIVE for significant power changes, NEGATIVE for clinical alarms and STABLE for PI events/speed drops. No programming changes were made and pump is functioning within specified parameters. Pt is performing daily controller and system monitor self tests along with completing weekly and monthly maintenance for LVAD equipment.   LVAD equipment check completed and is in good working order. Back-up equipment not present.  Follow up for Heart Failure/LVAD:  Here for routine VAD f/u. Continues on IV abx. Doing well. No fevers or  chills. Wound vac working well. Stil with some pain at wound vac site. No drainage. Has intermittent dizziness and palpitations. Denies orthopnea or PND.  No bleeding, melena or neuro symptoms. No VAD alarms. Taking all meds as prescribed.      Past Medical History:  Diagnosis Date   Anxiety    Aspirin allergy    Childhood asthma    Chronic systolic CHF (congestive heart failure) (HCC)    a. EF 20-25% in 2012. b. EF 45-50% in 10/2011 with nonischemic nuc - presumed NICM. c. 12/2014 Echo: Sev depressed LV fxn, sev dil LV, mild LVH, mild MR, sev dil LA, mildly reduced RV fxn.   CKD (chronic kidney disease) stage 2, GFR 60-89 ml/min    H/O vasectomy 12/2019   High cholesterol    Hypertension    Morbid obesity (HCC)    Nephrolithiasis    OSA on CPAP    Paroxysmal atrial fibrillation (HCC)    Presumed NICM    a. 04/2014 Myoview: EF 26%, glob HK, sev glob HK, ? prior infarct;  b. Never cathed 2/2 CKD.   Renal cell carcinoma (HCC)    a. s/p Rt robotic assisted partial converted to radical nephrectomy on 01/2013.   Troponin level elevated    a. 04/2014, 12/2014: felt due to CHF.   Type II diabetes mellitus (HCC)    Ventricular tachycardia (HCC)    a. appropriate ICD therapy 12/2017    Current Outpatient Medications  Medication Sig Dispense Refill   acetaminophen (TYLENOL) 325  MG tablet Take 2 tablets (650 mg total) by mouth every 6 (six) hours as needed for fever.     allopurinol (ZYLOPRIM) 100 MG tablet Take 1 tablet (100 mg total) by mouth daily. 90 tablet 3   amiodarone (PACERONE) 200 MG tablet Take 1 tablet (200 mg total) by mouth daily. 30 tablet 11   amLODipine (NORVASC) 10 MG tablet Take 1 tablet (10 mg total) by mouth daily 30 tablet 6   Blood Pressure KIT 1 Units by Does not apply route daily. 1 kit 0   cefadroxil (DURICEF) 500 MG capsule Take 1 capsule (500 mg total) by mouth 2 (two) times daily. 180 capsule 3   colchicine 0.6 MG tablet Take 1 tablet (0.6 mg total) by mouth once  daily. 30 tablet 6   cyanocobalamin (VITAMIN B12) 1000 MCG tablet Take 1 tablet (1,000 mcg total) by mouth daily. 30 tablet 0   cyclobenzaprine (FLEXERIL) 5 MG tablet Take 1 tablet (5 mg total) by mouth 3 (three) times daily as needed for muscle spasms 15 tablet 0   doxazosin (CARDURA) 4 MG tablet Take 2 tablets (8 mg total) by mouth 2 (two) times daily. 120 tablet 6   ferrous sulfate 325 (65 FE) MG tablet Take 1 tablet (325 mg total) by mouth daily before breakfast. 90 tablet 1   folic acid (FOLVITE) 800 MCG tablet Take 0.5 tablets (400 mcg total) by mouth daily. 30 tablet 2   gabapentin (NEURONTIN) 400 MG capsule Take 1 capsule (400 mg total) by mouth 3 (three) times daily. 30 capsule 6   isosorbide-hydrALAZINE (BIDIL) 20-37.5 MG tablet Take 2 tablets by mouth 3 (three) times daily. 180 tablet 6   metFORMIN (GLUCOPHAGE) 1000 MG tablet Take 1 tablet (1,000 mg total) by mouth 2 (two) times daily with a meal. 60 tablet 11   ondansetron (ZOFRAN-ODT) 4 MG disintegrating tablet Take 1 tablet (4 mg total) by mouth every 8 (eight) hours as needed for nausea or vomiting. 5 tablet 0   pantoprazole (PROTONIX) 40 MG tablet Take 1 tablet (40 mg total) by mouth once daily. 90 tablet 3   rosuvastatin (CRESTOR) 10 MG tablet Take 1 tablet (10 mg total) by mouth daily. 90 tablet 3   sacubitril-valsartan (ENTRESTO) 97-103 MG Take 1 tablet by mouth 2 (two) times daily. 60 tablet 6   sertraline (ZOLOFT) 100 MG tablet Take 1.5 tablets (150 mg total) by mouth daily. 45 tablet 6   spironolactone (ALDACTONE) 25 MG tablet Take 1 tablet (25 mg total) by mouth daily. 60 tablet 6   traMADol HCl 100 MG TABS Take 1 tablet (100 mg) by mouth every 6 (six) hours as needed for pain 90 tablet 0   traZODone (DESYREL) 100 MG tablet Take 1 tablet (100 mg total) by mouth at bedtime. 30 tablet 11   warfarin (COUMADIN) 4 MG tablet Take 2 tablet (8 mg) daily or as directed by HF clinic (Patient taking differently: Take 8 mg by mouth daily  at 4 PM. Take 3 tablets (12 mg) on Tuesdays, and 2 tablet (8 mg) all other days, or as directed by HF clinic) 90 tablet 11   glucose blood test strip Use as directed 4 times daily. 100 each 11   meclizine (ANTIVERT) 25 MG tablet Take 1 tablet (25 mg total) by mouth 3 (three) times daily as needed for dizziness. (Patient not taking: Reported on 10/21/2023) 90 tablet 2   No current facility-administered medications for this encounter.    Aspirin, Bee venom,  Lisinopril, and Tomato  REVIEW OF SYSTEMS: All systems negative except as listed in HPI, PMH and Problem list.   Vitals:   10/21/23 1316  BP: (!) 144/127  Pulse: 75  Temp: 98.4 F (36.9 C)  TempSrc: Oral  SpO2: 96%    Vital Signs:  Doppler Pressure: 130 Automatic BP: 144/127 HR: 75 SPO2: 96% on RA   Weight: 308;bs lb w/ eqt Last weight:  300.8 lbs w/eqt       Exam: General:  NAD.  HEENT: normal  Neck: supple. JVP not elevated.  Carotids 2+ bilat; no bruits. No lymphadenopathy or thryomegaly appreciated. Cor: LVAD hum.  Lungs: Clear. Abdomen: obese soft, nontender, non-distended. No hepatosplenomegaly. No bruits or masses. Good bowel sounds. Driveline site clean. Anchor in place.  Extremities: no cyanosis, clubbing, rash. Warm no edema  Neuro: alert & oriented x 3. No focal deficits. Moves all 4 without problem    ASSESSMENT AND PLAN:  .1. Chronic systolic CHF - Nonischemic CM, end-stage - EF 2012; 20-25%  - Echo  8/22: EF < 20%, severe LV dilation, moderate RV dysfunction.   - RHC (8/22): well compensated hemodynamics. - s/p S-ICD - S/p HM3 LVAD 10/17 (DT) - Likely with viral gastroenteritis, RVP to rule out influenza, appears to be improving with supportive care - NYHA I - Volume status ok. BP was very high today -> had not taken home meds, will take in the car after visit - Continue Bidil 2 tabs tid - Continue spironolactone 25 mg daily  - Continue entresto to 97/130 bid  2. VAD  - s/p HM-3 placement  07/21/21  - VAD interrogated personally. Parameters stable.. - INR 4.0 Goal INR 2.0-3.0 Discussed dosing with PharmD personally. - LDH 161 -  Continue cefadroxil for suppression. Followed by ID.  - Hgb 13.6  3. DL site infection - Resolved.  - Continue cefadroxil for long-term suppression  4 Atrial fibrillation: Paroxysmal - Had severe AF with RVR 7/22 s/p DC-CV x 2 - Seen by EP. Not a candidate for ablation due to body habitus  (could consider AVN ablation and CRT, if needed)  - Had post-op AF with RVR post VAD - 10/23 S/P DC-CV with restoration of NSR. - Remains in NSR today. Continue amio 100 daily. - On warfarin. INR management as above  5. HTN - MAPs very elevated on arrival. Will take home medications in car  6. CKD stage 3B  - Has solitary kidney.   - Baseline creatinine is 1.5-1.8.  - Creatinine1.8 today   7. OSA - Continue CPAP   8. DM2  - On Jardiance. - Hgb A1c 9.4. -> 5.9 - PCP following - Continue Crestor 10  9. Polyarticular gout. - quiescent  10. Depression/anxiety - Continue Zoloft 150mg  daily - Much improved  11. Morbid obesity - There is no height or weight on file to calculate BMI. - Continue GLP1RA  12.  Palpitations/dizziness - Zio 12/23 - 77 runs SVT. Continue amio. PVCs 1.9% PACs 4.7% - resolved  Total time spent 45 minutes. Over half that time spent discussing above.   Romie Minus, MD  1:49 PM

## 2023-10-21 NOTE — Addendum Note (Signed)
Encounter addended by: Romie Minus, MD on: 10/21/2023 2:02 PM  Actions taken: Clinical Note Signed, Level of Service modified

## 2023-10-22 ENCOUNTER — Other Ambulatory Visit: Payer: Self-pay

## 2023-10-25 ENCOUNTER — Other Ambulatory Visit (HOSPITAL_COMMUNITY): Payer: Self-pay | Admitting: Internal Medicine

## 2023-10-25 DIAGNOSIS — M109 Gout, unspecified: Secondary | ICD-10-CM

## 2023-10-25 DIAGNOSIS — N1831 Chronic kidney disease, stage 3a: Secondary | ICD-10-CM | POA: Diagnosis not present

## 2023-10-25 DIAGNOSIS — I10 Essential (primary) hypertension: Secondary | ICD-10-CM | POA: Diagnosis not present

## 2023-10-25 DIAGNOSIS — E1122 Type 2 diabetes mellitus with diabetic chronic kidney disease: Secondary | ICD-10-CM | POA: Diagnosis not present

## 2023-10-26 ENCOUNTER — Encounter (HOSPITAL_COMMUNITY): Payer: Medicaid Other | Admitting: Internal Medicine

## 2023-10-26 ENCOUNTER — Other Ambulatory Visit (HOSPITAL_COMMUNITY): Payer: Self-pay | Admitting: Nephrology

## 2023-10-26 ENCOUNTER — Other Ambulatory Visit: Payer: Self-pay

## 2023-10-26 DIAGNOSIS — N1831 Chronic kidney disease, stage 3a: Secondary | ICD-10-CM

## 2023-10-26 DIAGNOSIS — E1122 Type 2 diabetes mellitus with diabetic chronic kidney disease: Secondary | ICD-10-CM

## 2023-10-26 MED ORDER — ALLOPURINOL 100 MG PO TABS
100.0000 mg | ORAL_TABLET | Freq: Every day | ORAL | 3 refills | Status: DC
  Filled 2023-10-26: qty 90, 90d supply, fill #0
  Filled 2024-01-19: qty 90, 90d supply, fill #1
  Filled 2024-04-19 – 2024-04-21 (×2): qty 90, 90d supply, fill #2
  Filled 2024-08-02: qty 90, 90d supply, fill #3

## 2023-10-27 ENCOUNTER — Other Ambulatory Visit: Payer: Self-pay | Admitting: Obstetrics and Gynecology

## 2023-10-27 ENCOUNTER — Other Ambulatory Visit: Payer: Self-pay

## 2023-10-27 NOTE — Patient Outreach (Signed)
Medicaid Managed Care   Nurse Care Manager Note  10/27/2023 Name:  Jeffrey [name] MRN:  [id number] DOB:  1976-01-29  Jeffrey [name] is an 48 y.o. year old male who is a primary patient of Ivonne Andrew, NP.  The [facility] Managed Care Coordination team was consulted for assistance with:    Chronic healthcare management needs, CHF, CKD, gout, HTN, DM, h/o renal cancer, OSA, VAD, DLD, HLD, depression  Jeffrey [name] was given information about Medicaid Managed Care Coordination team services today. Jeffrey [name] Patient agreed to services and verbal consent obtained.  Engaged with patient by telephone for follow up visit in response to provider referral for case management and/or care coordination services.   Patient is participating in a Managed Medicaid Plan:  Yes  Assessments/Interventions:  Review of past medical history, allergies, medications, health status, including review of consultants reports, laboratory and other test data, was performed as part of comprehensive evaluation and provision of chronic care management services.  SDOH (Social Drivers of Health) assessments and interventions performed: SDOH Interventions    Flowsheet Row Patient Outreach Telephone from 10/27/2023 in [place] POPULATION [facility]ALTH DEPARTMENT Patient Outreach Telephone from 10/18/2023 in [place] POPULATION HEALTH DEPARTMENT Patient Outreach Telephone from 09/08/2023 in [place] POPULATION HEALTH DEPARTMENT Patient Outreach Telephone from 06/30/2023 in [place] POPULATION HEALTH DEPARTMENT Patient Outreach Telephone from 04/29/2023 in [place] POPULATION HEALTH DEPARTMENT Patient Outreach Telephone from 12/29/2022 in [place] POPULATION HEALTH DEPARTMENT  SDOH Interventions        Food Insecurity Interventions -- -- -- -- Intervention Not Indicated --  Housing Interventions -- -- -- Intervention Not Indicated -- --  Transportation Interventions -- Intervention Not Indicated -- -- -- --  Utilities  Interventions -- -- -- Intervention Not Indicated -- --  Alcohol Usage Interventions Intervention Not Indicated (Score <7) -- -- -- -- Intervention Not Indicated (Score <7)  Financial Strain Interventions -- -- Other (Comment)  [Medicaid approved, disability one time payment, to f/u BSW] -- -- --  Physical Activity Interventions -- Intervention Not Indicated -- -- -- --  Stress Interventions -- -- Other (Comment)  [food stamps and Medicaid now, Disability one time payment, patient will f/u] -- -- --  Health Literacy Interventions -- -- -- -- Intervention Not Indicated --     Care Plan Allergies  Allergen Reactions   Aspirin Shortness Of Breath, Itching and Rash     Burning sensation (Patient reports he tolerates other NSAIDS)    Bee Venom Hives and Swelling   Lisinopril Cough and Other (See Comments)   Tomato Hives   Medications Reviewed Today     Reviewed by Danie Chandler, RN (Registered Nurse) on 10/27/23 at 1451  Med List Status: <None>   Medication Order Taking? Sig Documenting Provider Last Dose Status Informant  acetaminophen (TYLENOL) 325 MG tablet [id number] No Take 2 tablets (650 mg total) by mouth every 6 (six) hours as needed for fever. Sherald Hess, NP Taking Active Self, Pharmacy Records           Med Note [facility], [facility]   Thu Mar 18, 2023  2:30 PM) PRN   allopurinol (ZYLOPRIM) 100 MG tablet [id number]  Take 1 tablet (100 mg total) by mouth daily. Bensimhon, Bevelyn Buckles, MD  Active   amiodarone (PACERONE) 200 MG tablet [id number] No Take 1 tablet (200 mg total) by mouth daily. Bensimhon, Bevelyn Buckles, MD Taking Active   amLODipine (NORVASC) 10 MG tablet [id number] No Take 1 tablet (  10 mg total) by mouth daily Ivonne Andrew, NP Taking Active   Blood Pressure KIT [id number] No 1 Units by Does not apply route daily. Ivonne Andrew, NP Taking Active   cefadroxil (DURICEF) 500 MG capsule [id number] No Take 1 capsule (500 mg total) by mouth 2 (two) times daily. Bensimhon,  Bevelyn Buckles, MD Taking Active   colchicine 0.6 MG tablet [id number] No Take 1 tablet (0.6 mg total) by mouth once daily. Bensimhon, Bevelyn Buckles, MD Taking Active   cyanocobalamin (VITAMIN B12) 1000 MCG tablet [id number] No Take 1 tablet (1,000 mcg total) by mouth daily. Ivonne Andrew, NP Taking Active   cyclobenzaprine (FLEXERIL) 5 MG tablet [id number] No Take 1 tablet (5 mg total) by mouth 3 (three) times daily as needed for muscle spasms  Taking Active   doxazosin (CARDURA) 4 MG tablet [id number] No Take 2 tablets (8 mg total) by mouth 2 (two) times daily. Bensimhon, Bevelyn Buckles, MD Taking Active   ferrous sulfate 325 (65 FE) MG tablet [id number] No Take 1 tablet (325 mg total) by mouth daily before breakfast. Ivonne Andrew, NP Taking Active   folic acid (FOLVITE) 800 MCG tablet [id number] No Take 0.5 tablets (400 mcg total) by mouth daily. Ivonne Andrew, NP Taking Active   gabapentin (NEURONTIN) 400 MG capsule [id number] No Take 1 capsule (400 mg total) by mouth 3 (three) times daily. Bensimhon, Bevelyn Buckles, MD Taking Active   glucose blood test strip [id number] No Use as directed 4 times daily. Orion Crook I, NP Taking Active Self, Pharmacy Records  isosorbide-hydrALAZINE (BIDIL) 20-37.5 MG tablet [id number] No Take 2 tablets by mouth 3 (three) times daily. Bensimhon, Bevelyn Buckles, MD Taking Active   meclizine (ANTIVERT) 25 MG tablet [id number] No Take 1 tablet (25 mg total) by mouth 3 (three) times daily as needed for dizziness.  Patient not taking: Reported on 10/21/2023   Bensimhon, Bevelyn Buckles, MD Not Taking Active Self, Pharmacy Records           Med Note Paden, Desma Paganini May 05, 2023  9:08 AM) More dizziness/vertigo lately  metFORMIN (GLUCOPHAGE) 1000 MG tablet [id number] No Take 1 tablet (1,000 mg total) by mouth 2 (two) times daily with a meal. Bensimhon, Bevelyn Buckles, MD Taking Active   ondansetron (ZOFRAN-ODT) 4 MG disintegrating tablet [id number]  Take 1 tablet (4 mg total) by mouth every 8  (eight) hours as needed for nausea or vomiting. Romie Minus, MD  Active   pantoprazole (PROTONIX) 40 MG tablet [id number] No Take 1 tablet (40 mg total) by mouth once daily. Bensimhon, Bevelyn Buckles, MD Taking Active   rosuvastatin (CRESTOR) 10 MG tablet [id number] No Take 1 tablet (10 mg total) by mouth daily. Bensimhon, Bevelyn Buckles, MD Taking Active   sacubitril-valsartan (ENTRESTO) 97-103 MG [id number] No Take 1 tablet by mouth 2 (two) times daily. Bensimhon, Bevelyn Buckles, MD Taking Active Self, Pharmacy Records  sertraline (ZOLOFT) 100 MG tablet [id number] No Take 1.5 tablets (150 mg total) by mouth daily. Bensimhon, Bevelyn Buckles, MD Taking Active   spironolactone (ALDACTONE) 25 MG tablet [id number] No Take 1 tablet (25 mg total) by mouth daily. Bensimhon, Bevelyn Buckles, MD Taking Active   traMADol HCl 100 MG TABS [id number] No Take 1 tablet (100 mg) by mouth every 6 (six) hours as needed for pain Bensimhon, Bevelyn Buckles, MD Taking Active   traZODone (DESYREL) 100 MG tablet [id number] No Take 1 tablet (100 mg total) by mouth at bedtime. Bensimhon, Reuel Boom  R, MD Taking Active   warfarin (COUMADIN) 4 MG tablet [id number] No Take 2 tablet (8 mg) daily or as directed by HF clinic  Patient taking differently: Take 8 mg by mouth daily at 4 PM. 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/10/2022   Presence of left ventricular assist device (LVAD) (HCC) 07/25/2021   Acute on chronic systolic CHF (congestive heart failure) (HCC) 07/18/2021   Cardiogenic shock (HCC)    Atrial fibrillation with RVR (HCC) 04/30/2021   Acute hypoxemic respiratory failure (HCC)    Right wrist pain 04/16/2021   Absolute anemia     Rectal bleeding    Iron deficiency 12/25/2020   Long term current use of anticoagulant - apixaban 12/25/2020   Paroxysmal atrial fibrillation (HCC)    Lactic acidosis 11/22/2020   Hypotension 11/22/2020   Aspiration into airway    ICD (implantable cardioverter-defibrillator) battery depletion 05/15/2020   Endotracheal tube present    Respiratory failure (HCC)    Acute encephalopathy    Acute pulmonary edema (HCC)    NICM (nonischemic cardiomyopathy) (HCC) 04/24/2020   S/P vasectomy 12/20/2019   Anxiety 12/20/2019   Hemoglobin A1C between 7% and 9% indicating borderline diabetic control (HCC) 12/20/2019   Seasonal allergies 12/20/2019   Insomnia 12/20/2019   Vasectomy evaluation 06/20/2019   Visual problems 06/20/2019   Blurry vision, bilateral 06/20/2019   Hyperglycemia 02/13/2019   Hyperosmolar (nonketotic) coma (HCC) 01/19/2019   AICD (automatic cardioverter/defibrillator) present 01/19/2019   Hyperlipidemia 12/07/2018   Follow-up exam 11/22/2018   Class 3 severe obesity due to excess calories with serious comorbidity and body mass index (BMI) of 45.0 to 49.9 in adult (HCC) 11/22/2018   Dizziness 11/22/2018   Lingular pneumonia 08/04/2018   Ventricular tachycardia (HCC) 12/30/2017   PAF (paroxysmal atrial fibrillation) (HCC)    Dilated cardiomyopathy (HCC)    Pollen allergy 02/17/2017   Dyslipidemia 02/17/2017   Hematochezia 11/08/2015   CKD (chronic kidney disease), stage III (HCC) 06/20/2015   Cardiac defibrillator in situ    Chronic systolic CHF (congestive heart failure) (HCC) 03/11/2015   Gouty arthritis 03/11/2015   Chest pain 12/28/2014   Acute renal failure superimposed on stage 3 chronic kidney disease (HCC) 04/08/2014   Aspirin allergy 04/08/2014   Renal cell carcinoma (HCC)    Gout attack 11/10/2011   Morbid obesity (HCC) 10/23/2011   OSA (obstructive sleep apnea) 10/23/2011   Type 2 diabetes mellitus with stage 3 chronic kidney disease, with long-term  current use of insulin (HCC) 10/22/2011   Hyperlipidemia associated with type 2 diabetes mellitus (HCC) 12/25/2010   Hypertension associated with diabetes (HCC) 12/25/2010   Conditions to be addressed/monitored per PCP order:  Chronic healthcare management needs, CHF, CKD, gout, HTN, DM, h/o renal cancer, OSA, VAD, DLD, HLD, depression  Care Plan : General Plan of Care (Adult)  Updates made by Danie Chandler, RN since 10/27/2023 12:00 AM     Problem: Health Promotion or Disease Self-Management (General Plan of Care)   Priority: High  Onset Date: 01/29/2023  Note:   Current Barriers:  Chronic Disease Management support and education needs, S/P LVAD 07/21/21, DM, HTN, OSA, CHF, Afib, CKD, gout, DDD, HLD, depression,  h/o renal Ca 10/27/23:  Patient feeling better today.  Saw nephrology this week-has u/s and labs this week.  Upcoming routine GI and VAD appt.  BP and NG stable.  No updates on further disability payments.  Weekly dressing changes continue with suppressive therapy.  Nurse Case Manager Clinical Goal(s):  Over the next 90 days, patient will attend all scheduled medical appointments: Over the next 90 days, patient will have dental appt scheduled  Interventions:  Inter-disciplinary care team collaboration (see longitudinal plan of care) Collaborated with BSW  BSW referral for disability information-completed Evaluation of current treatment plan and patient's adherence to plan as established by provider. .  Reviewed medications with patient. Discussed plans with patient for ongoing care management follow up and provided patient with direct contact information for care management team Provided patient with educational materials related to exercise. Reviewed scheduled/upcoming provider appointments. Collaborated with Pharmacy Pharmacy referral for medication review 10/27/23:  BSW appt scheduled at patient request.  AFIB Interventions: (Status:  New goal.) Long Term Goal   Reviewed  importance of adherence to anticoagulant exactly as prescribed Counseled on importance of regular laboratory monitoring as prescribed Afib action plan reviewed Assessed social determinant of health barriers  Heart Failure Interventions:  (Status:  New goal.) Long Term Goal Basic overview and discussion of pathophysiology of Heart Failure reviewed Discussed the importance of keeping all appointments with provider Assessed social determinant of health barriers    Chronic Kidney Disease Interventions:  (Status:  New goal.) Long Term Goal Evaluation of current treatment plan related to chronic kidney disease self management and patient's adherence to plan as established by provider      Reviewed prescribed diet  Reviewed medications with patient and discussed importance of compliance    Reviewed scheduled/upcoming provider appointments  Assessed social determinant of health barriers    Last practice recorded BP readings:  BP Readings from Last 3 Encounters:  09/08/23  120/90     06/17/23  87/65  10/07/23         98/72  Most recent eGFR/CrCl: No results found for: "EGFR"  No components found for: "CRCL"  Diabetes Interventions:  (Status:  New goal.) Long Term Goal Assessed patient's understanding of A1c goal: <7% Reviewed medications with patient and discussed importance of medication adherence Counseled on importance of regular laboratory monitoring as prescribed Discussed plans with patient for ongoing care management follow up and provided patient with direct contact information for care management team Reviewed scheduled/upcoming provider appointments  Advised patient, providing education and rationale, to check cbg as directed and record, calling provider  for findings outside established parameters Review of patient status, including review of consultants reports, relevant laboratory and other test results, and medications completed Assessed social determinant of health barriers Lab  Results  Component Value Date   HGBA1C 5.3 09/16/2022             HGBA1C                                                      5.7  10/07/2023            HGBA1C                                                      5.5                                    03/18/2023  Hyperlipidemia Interventions:  (Status:  New goal.) Long Term Goal Medication review performed; medication list updated in electronic medical record.  Provider established cholesterol goals reviewed Counseled on importance of regular laboratory monitoring as prescribed Reviewed importance of limiting foods high in cholesterol Assessed social determinant of health barriers   Hypertension Interventions:  (Status:  New goal.) Long Term Goal Last practice recorded BP readings:  BP Readings from Last 3 Encounters:  09/08/23  120/90  06/17/23  87/65  10/07/23 98/72   Most recent eGFR/CrCl: No results found for: "EGFR"  No components found for: "CRCL"  Evaluation of current treatment plan related to hypertension self management and patient's adherence to plan as established by provider Reviewed medications with patient and discussed importance of compliance Discussed plans with patient for ongoing care management follow up and provided patient with direct contact information for care management team Advised patient, providing education and rationale, to monitor blood pressure daily and record, calling PCP for findings outside established parameters Reviewed scheduled/upcoming provider appointments  Assessed social determinant of health barriers   Patient Goals/Self-Care Activities Over the next 90 days, patient will:  -Attends all scheduled provider appointments Calls provider office for new concerns or questionss  Follow Up Plan: The Managed Medicaid care management team will reach out to the patient again over the next 30 business  days.  The patient has been provided with contact information  for the Managed Medicaid care management team and has been advised to call with any health related questions or concerns.    Follow Up:  Patient agrees to Care Plan and Follow-up.  Plan: The Managed Medicaid care management team will reach out to the patient again over the next 30 business  days. and The  Patient has been provided with contact information for the Managed Medicaid care management team and has been advised to call with any health related questions or concerns.  Date/time of next scheduled RN care management/care coordination outreach:  11/29/23 at 0900.

## 2023-10-27 NOTE — Patient Instructions (Signed)
Hi Jeffrey [name], good to speak with you this afternoon, have a nice rest of the day!  Jeffrey [name] was given information about Medicaid Managed Care team care coordination services as a part of their Healthy [facility] Medicaid benefit. Jeffrey [name] verbally consented to engagement with the [facility] Managed Care team.   If you are experiencing a medical emergency, please call 911 or report to your local emergency department or urgent care.   If you have a non-emergency medical problem during routine business hours, please contact your provider's office and ask to speak with a nurse.   For questions related to your Healthy [facility] health plan, please call: [phone] or visit the homepage here: [email or web address]  If you would like to schedule transportation through your Healthy [facility] plan, please call the following number at least 2 days in advance of your appointment: [phone]  For information about your ride after you set it up, call Ride Assist at [phone]. Use this number to activate a Will Call pickup, or if your transportation is late for a scheduled pickup. Use this number, too, if you need to make a change or cancel a previously scheduled reservation.  If you need transportation services right away, call [phone]. The after-hours call center is staffed 24 hours to handle ride assistance and urgent reservation requests (including discharges) 365 days a year. Urgent trips include sick visits, hospital discharge requests and life-sustaining treatment.  Call the [facility] Line at [phone], at any time, 24 hours a day, 7 days a week. If you are in danger or need immediate medical attention call 911.  If you would like help to quit smoking, call 1-800-QUIT-NOW (304-300-5781) OR Espaol: 1-855-Djelo-Ya (7-564-332-9518) o para ms informacin haga clic aqu or Text READY to 841-660 to register via text  Jeffrey [name] - following  are the goals we discussed in your visit today:   Goals Addressed             This Visit's Progress    Protect My Health       Timeframe:  Long-Range Goal Priority:  High Start Date:    09/24/20                         Expected End Date:  ongoing           Follow up date: 11/29/23 - schedule recommended health tests  - schedule and keep appointment for annual check-up   10/27/23: Renal u/s and labs this week.  Upcoming GI and VAD appt.   Patient verbalizes understanding of instructions and care plan provided today and agrees to view in MyChart. Active MyChart status and patient understanding of how to access instructions and care plan via MyChart confirmed with patient.     The Managed Medicaid care management team will reach out to the patient again over the next 30 business  days.  The  Patient  has been provided with contact information for the Managed Medicaid care management team and has been advised to call with any health related questions or concerns.     Following is a copy of your plan of care:  Care Plan : General Plan of Care (Adult)  Updates made by Danie Chandler, RN since 10/27/2023 12:00 AM     Problem: Health Promotion or Disease Self-Management (General Plan of Care)   Priority: High  Onset Date: 01/29/2023  Note:   Current Barriers:  Chronic Disease Management support and  education needs, S/P LVAD 07/21/21, DM, HTN, OSA, CHF, Afib, CKD, gout, DDD, HLD, depression,  h/o renal Ca 10/27/23:  Patient feeling better today.  Saw nephrology this week-has u/s and labs this week.  Upcoming routine GI and VAD appt.  BP and NG stable.  No updates on further disability payments.  Weekly dressing changes continue with suppressive therapy.  Nurse Case Manager Clinical Goal(s):  Over the next 90 days, patient will attend all scheduled medical appointments: Over the next 90 days, patient will have dental appt scheduled  Interventions:  Inter-disciplinary care team  collaboration (see longitudinal plan of care) Collaborated with BSW  BSW referral for disability information-completed Evaluation of current treatment plan and patient's adherence to plan as established by provider. .  Reviewed medications with patient. Discussed plans with patient for ongoing care management follow up and provided patient with direct contact information for care management team Provided patient with educational materials related to exercise. Reviewed scheduled/upcoming provider appointments. Collaborated with Pharmacy Pharmacy referral for medication review 10/27/23:  BSW appt scheduled at patient request.  AFIB Interventions: (Status:  New goal.) Long Term Goal   Reviewed importance of adherence to anticoagulant exactly as prescribed Counseled on importance of regular laboratory monitoring as prescribed Afib action plan reviewed Assessed social determinant of health barriers  Heart Failure Interventions:  (Status:  New goal.) Long Term Goal Basic overview and discussion of pathophysiology of Heart Failure reviewed Discussed the importance of keeping all appointments with provider Assessed social determinant of health barriers    Chronic Kidney Disease Interventions:  (Status:  New goal.) Long Term Goal Evaluation of current treatment plan related to chronic kidney disease self management and patient's adherence to plan as established by provider      Reviewed prescribed diet  Reviewed medications with patient and discussed importance of compliance    Reviewed scheduled/upcoming provider appointments  Assessed social determinant of health barriers    Last practice recorded BP readings:  BP Readings from Last 3 Encounters:  09/08/23  120/90     06/17/23  87/65  10/07/23         98/72  Most recent eGFR/CrCl: No results found for: "EGFR"  No components found for: "CRCL"  Diabetes Interventions:  (Status:  New goal.) Long Term Goal Assessed patient's understanding  of A1c goal: <7% Reviewed medications with patient and discussed importance of medication adherence Counseled on importance of regular laboratory monitoring as prescribed Discussed plans with patient for ongoing care management follow up and provided patient with direct contact information for care management team Reviewed scheduled/upcoming provider appointments  Advised patient, providing education and rationale, to check cbg as directed and record, calling provider  for findings outside established parameters Review of patient status, including review of consultants reports, relevant laboratory and other test results, and medications completed Assessed social determinant of health barriers Lab Results  Component Value Date   HGBA1C 5.3 09/16/2022             HGBA1C                                                      5.7  10/07/2023            HGBA1C                                                      5.5                                    03/18/2023  Hyperlipidemia Interventions:  (Status:  New goal.) Long Term Goal Medication review performed; medication list updated in electronic medical record.  Provider established cholesterol goals reviewed Counseled on importance of regular laboratory monitoring as prescribed Reviewed importance of limiting foods high in cholesterol Assessed social determinant of health barriers   Hypertension Interventions:  (Status:  New goal.) Long Term Goal Last practice recorded BP readings:  BP Readings from Last 3 Encounters:  09/08/23  120/90  06/17/23  87/65  10/07/23 98/72   Most recent eGFR/CrCl: No results found for: "EGFR"  No components found for: "CRCL"  Evaluation of current treatment plan related to hypertension self management and patient's adherence to plan as established by provider Reviewed medications with patient and discussed importance of compliance Discussed plans with patient for ongoing care  management follow up and provided patient with direct contact information for care management team Advised patient, providing education and rationale, to monitor blood pressure daily and record, calling PCP for findings outside established parameters Reviewed scheduled/upcoming provider appointments  Assessed social determinant of health barriers   Patient Goals/Self-Care Activities Over the next 90 days, patient will:  -Attends all scheduled provider appointments Calls provider office for new concerns or questionss  Follow Up Plan: The Managed Medicaid care management team will reach out to the patient again over the next 30 business  days.  The patient has been provided with contact information for the Managed Medicaid care management team and has been advised to call with any health related questions or concerns.

## 2023-10-28 ENCOUNTER — Telehealth: Payer: Self-pay

## 2023-10-28 NOTE — Telephone Encounter (Signed)
Copied from CRM [id number]. Topic: General - Other >> Oct 28, 2023  1:16 PM Geneva B wrote: Reason for CRM: patient is calling because he has an upcoming appointment 01/30 at [facility] and he wants to know if he has to attend that appointment because he had an  similar appointment somewhere else not too long ago please call patient  [phone]

## 2023-10-28 NOTE — Telephone Encounter (Signed)
Received this and they are not our patient. I have reached out to [id number] to have this sent to the correct location.

## 2023-10-29 NOTE — Progress Notes (Unsigned)
[name]

## 2023-11-01 ENCOUNTER — Ambulatory Visit: Payer: Medicaid Other | Admitting: Gastroenterology

## 2023-11-01 ENCOUNTER — Other Ambulatory Visit: Payer: Self-pay

## 2023-11-01 NOTE — Patient Instructions (Signed)
Visit Information  Mr. Masini was given information about Medicaid Managed Care team care coordination services as a part of their Healthy [facility] Medicaid benefit. [name] verbally consented to engagement with the [facility] Managed Care team.   If you are experiencing a medical emergency, please call 911 or report to your local emergency department or urgent care.   If you have a non-emergency medical problem during routine business hours, please contact your provider's office and ask to speak with a nurse.   For questions related to your Healthy [facility] health plan, please call: [phone] or visit the homepage here: [email or web address]  If you would like to schedule transportation through your Healthy [facility] plan, please call the following number at least 2 days in advance of your appointment: [phone]  For information about your ride after you set it up, call Ride Assist at [phone]. Use this number to activate a Will Call pickup, or if your transportation is late for a scheduled pickup. Use this number, too, if you need to make a change or cancel a previously scheduled reservation.  If you need transportation services right away, call [phone]. The after-hours call center is staffed 24 hours to handle ride assistance and urgent reservation requests (including discharges) 365 days a year. Urgent trips include sick visits, hospital discharge requests and life-sustaining treatment.  Call the [facility] Line at [phone], at any time, 24 hours a day, 7 days a week. If you are in danger or need immediate medical attention call 911.  If you would like help to quit smoking, call 1-800-QUIT-NOW ((873) 779-9770) OR Espaol: 1-855-Djelo-Ya (1-601-093-2355) o para ms informacin haga clic aqu or Text READY to 732-202 to register via text  Mr. Meter - following are the goals we discussed in your visit today:   Goals  Addressed   None      The  Patient                                              has been provided with contact information for the Managed Medicaid care management team and has been advised to call with any health related questions or concerns.   Gus Puma, Kenard Gower, MHA [facility]  Managed Medicaid Social Worker [phone]   Following is a copy of your plan of care:  There are no care plans that you recently modified to display for this patient.

## 2023-11-01 NOTE — Patient Outreach (Signed)
Medicaid Managed Care Social Work Note  11/01/2023 Name:  Jeffrey [name] MRN:  [id number] DOB:  25-Dec-1975  Jeffrey [name] is an 48 y.o. year old male who is a primary patient of Ivonne Andrew, NP.  The [facility] Managed Care Coordination team was consulted for assistance with:   disability questions  Jeffrey [name] was given information about Medicaid Managed Care Coordination team services today. Jeffrey [name] Patient agreed to services and verbal consent obtained.  Engaged with patient  for by telephone forfollow up visit in response to referral for case management and/or care coordination services.   Patient is participating in a Managed Medicaid Plan:  Yes  Assessments/Interventions:  Review of past medical history, allergies, medications, health status, including review of consultants reports, laboratory and other test data, was performed as part of comprehensive evaluation and provision of chronic care management services.  SDOH: (Social Drivers of Health) assessments and interventions performed: SDOH Interventions    Flowsheet Row Patient Outreach Telephone from 10/27/2023 in [place] [facility] POP[facility]ALTH DEPART[facility] Patient Outreach Telephone from 10/18/2023 in [place] POPULATION HEALTH DEPARTMENT Patient Outreach Telephone from 09/08/2023 in [place] POPULATION HEALTH DEPARTMENT Patient Outreach Telephone from 06/30/2023 in [place] POPULATION HEALTH DEPARTMENT Patient Outreach Telephone from 04/29/2023 in [place] POPULATION HEALTH DEPARTMENT Patient Outreach Telephone from 12/29/2022 in [place] POPULATION HEALTH DEPARTMENT  SDOH Interventions        Food Insecurity Interventions -- -- -- -- Intervention Not Indicated --  Housing Interventions -- -- -- Intervention Not Indicated -- --  Transportation Interventions -- Intervention Not Indicated -- -- -- --  Utilities Interventions -- -- -- Intervention Not Indicated -- --  Alcohol Usage Interventions Intervention Not Indicated  (Score <7) -- -- -- -- Intervention Not Indicated (Score <7)  Financial Strain Interventions -- -- Other (Comment)  [Medicaid approved, disability one time payment, to f/u BSW] -- -- --  Physical Activity Interventions -- Intervention Not Indicated -- -- -- --  Stress Interventions -- -- Other (Comment)  [food stamps and Medicaid now, Disability one time payment, patient will f/u] -- -- --  Health Literacy Interventions -- -- -- -- Intervention Not Indicated --     BSW completed a telephone outreach with patient, he states he was approved for disability but will now be able to receive it because his wife make too much on her job. BSW informed she had not heard of that before. Patient states he has called Social security administration but has not been able to speak with anyone. BSW informed patient he could go to [facility] as a walk in to speak with someone. No other resources are needed at this time.  Advanced Directives Status:  Not addressed in this encounter.  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/10/2022   Presence of left ventricular assist device (LVAD) (HCC) 07/25/2021   Acute on chronic systolic CHF (congestive heart failure) (HCC) 07/18/2021  Cardiogenic shock (HCC)    Atrial fibrillation with RVR (HCC) 04/30/2021   Acute hypoxemic respiratory failure (HCC)    Right wrist pain 04/16/2021   Absolute anemia    Rectal bleeding    Iron deficiency 12/25/2020   Long term current use of anticoagulant - apixaban 12/25/2020   Paroxysmal atrial  fibrillation (HCC)    Lactic acidosis 11/22/2020   Hypotension 11/22/2020   Aspiration into airway    ICD (implantable cardioverter-defibrillator) battery depletion 05/15/2020   Endotracheal tube present    Respiratory failure (HCC)    Acute encephalopathy    Acute pulmonary edema (HCC)    NICM (nonischemic cardiomyopathy) (HCC) 04/24/2020   S/P vasectomy 12/20/2019   Anxiety 12/20/2019   Hemoglobin A1C between 7% and 9% indicating borderline diabetic control (HCC) 12/20/2019   Seasonal allergies 12/20/2019   Insomnia 12/20/2019   Vasectomy evaluation 06/20/2019   Visual problems 06/20/2019   Blurry vision, bilateral 06/20/2019   Hyperglycemia 02/13/2019   Hyperosmolar (nonketotic) coma (HCC) 01/19/2019   AICD (automatic cardioverter/defibrillator) present 01/19/2019   Hyperlipidemia 12/07/2018   Follow-up exam 11/22/2018   Class 3 severe obesity due to excess calories with serious comorbidity and body mass index (BMI) of 45.0 to 49.9 in adult (HCC) 11/22/2018   Dizziness 11/22/2018   Lingular pneumonia 08/04/2018   Ventricular tachycardia (HCC) 12/30/2017   PAF (paroxysmal atrial fibrillation) (HCC)    Dilated cardiomyopathy (HCC)    Pollen allergy 02/17/2017   Dyslipidemia 02/17/2017   Hematochezia 11/08/2015   CKD (chronic kidney disease), stage III (HCC) 06/20/2015   Cardiac defibrillator in situ    Chronic systolic CHF (congestive heart failure) (HCC) 03/11/2015   Gouty arthritis 03/11/2015   Chest pain 12/28/2014   Acute renal failure superimposed on stage 3 chronic kidney disease (HCC) 04/08/2014   Aspirin allergy 04/08/2014   Renal cell carcinoma (HCC)    Gout attack 11/10/2011   Morbid obesity (HCC) 10/23/2011   OSA (obstructive sleep apnea) 10/23/2011   Type 2 diabetes mellitus with stage 3 chronic kidney disease, with long-term current use of insulin (HCC) 10/22/2011   Hyperlipidemia associated with type 2 diabetes mellitus (HCC) 12/25/2010   Hypertension  associated with diabetes (HCC) 12/25/2010    Conditions to be addressed/monitored per PCP order:   disability questions  There are no care plans that you recently modified to display for this patient.   Follow up:  Patient agrees to Care Plan and Follow-up.  Plan: The  Patient has been provided with contact information for the Managed Medicaid care management team and has been advised to call with any health related questions or concerns.    Abelino Derrick, MHA [facility]  Managed [facility] Social Worker [phone]

## 2023-11-02 ENCOUNTER — Ambulatory Visit (HOSPITAL_COMMUNITY)
Admission: RE | Admit: 2023-11-02 | Discharge: 2023-11-02 | Disposition: A | Discharge status: Home / Self Care | Payer: Medicaid Other | Source: Ambulatory Visit | Attending: Nephrology | Admitting: Nephrology

## 2023-11-02 DIAGNOSIS — E1122 Type 2 diabetes mellitus with diabetic chronic kidney disease: Secondary | ICD-10-CM | POA: Insufficient documentation

## 2023-11-02 DIAGNOSIS — N189 Chronic kidney disease, unspecified: Secondary | ICD-10-CM | POA: Diagnosis not present

## 2023-11-02 DIAGNOSIS — N1831 Chronic kidney disease, stage 3a: Secondary | ICD-10-CM | POA: Diagnosis not present

## 2023-11-04 ENCOUNTER — Other Ambulatory Visit (HOSPITAL_COMMUNITY): Payer: Self-pay

## 2023-11-11 ENCOUNTER — Other Ambulatory Visit (HOSPITAL_COMMUNITY): Payer: Self-pay | Admitting: Unknown Physician Specialty

## 2023-11-11 ENCOUNTER — Other Ambulatory Visit (HOSPITAL_COMMUNITY): Payer: Self-pay | Admitting: Internal Medicine

## 2023-11-11 ENCOUNTER — Other Ambulatory Visit: Payer: Self-pay

## 2023-11-11 DIAGNOSIS — I152 Hypertension secondary to endocrine disorders: Secondary | ICD-10-CM

## 2023-11-11 DIAGNOSIS — Z95811 Presence of heart assist device: Secondary | ICD-10-CM

## 2023-11-11 DIAGNOSIS — I5022 Chronic systolic (congestive) heart failure: Secondary | ICD-10-CM

## 2023-11-12 ENCOUNTER — Ambulatory Visit (HOSPITAL_COMMUNITY)
Admission: RE | Admit: 2023-11-12 | Discharge: 2023-11-12 | Disposition: A | Discharge status: Home / Self Care | Payer: Medicaid Other | Source: Ambulatory Visit | Attending: Internal Medicine | Admitting: Internal Medicine

## 2023-11-12 ENCOUNTER — Ambulatory Visit (HOSPITAL_COMMUNITY): Payer: Self-pay | Admitting: Pharmacist

## 2023-11-12 ENCOUNTER — Other Ambulatory Visit: Payer: Self-pay

## 2023-11-12 VITALS — BP 100/0 | HR 88 | Ht 67.0 in | Wt 308.4 lb

## 2023-11-12 DIAGNOSIS — I10 Essential (primary) hypertension: Secondary | ICD-10-CM

## 2023-11-12 DIAGNOSIS — Z79899 Other long term (current) drug therapy: Secondary | ICD-10-CM | POA: Diagnosis not present

## 2023-11-12 DIAGNOSIS — I5022 Chronic systolic (congestive) heart failure: Secondary | ICD-10-CM | POA: Diagnosis not present

## 2023-11-12 DIAGNOSIS — Z7901 Long term (current) use of anticoagulants: Secondary | ICD-10-CM

## 2023-11-12 DIAGNOSIS — Z95811 Presence of heart assist device: Secondary | ICD-10-CM | POA: Insufficient documentation

## 2023-11-12 DIAGNOSIS — T827XXA Infection and inflammatory reaction due to other cardiac and vascular devices, implants and grafts, initial encounter: Secondary | ICD-10-CM

## 2023-11-12 LAB — CBC
HCT: 39.4 % (ref 39.0–52.0)
Hemoglobin: 12.8 g/dL — ABNORMAL LOW (ref 13.0–17.0)
MCH: 26.1 pg (ref 26.0–34.0)
MCHC: 32.5 g/dL (ref 30.0–36.0)
MCV: 80.2 fL (ref 80.0–100.0)
Platelets: 198 10*3/uL (ref 150–400)
RBC: 4.91 MIL/uL (ref 4.22–5.81)
RDW: 19.2 % — ABNORMAL HIGH (ref 11.5–15.5)
WBC: 5.8 10*3/uL (ref 4.0–10.5)
nRBC: 0 % (ref 0.0–0.2)

## 2023-11-12 LAB — BASIC METABOLIC PANEL
Anion gap: 11 (ref 5–15)
BUN: 19 mg/dL (ref 6–20)
CO2: 19 mmol/L — ABNORMAL LOW (ref 22–32)
Calcium: 9.6 mg/dL (ref 8.9–10.3)
Chloride: 111 mmol/L (ref 98–111)
Creatinine, Ser: 1.47 mg/dL — ABNORMAL HIGH (ref 0.61–1.24)
GFR, Estimated: 58 mL/min — ABNORMAL LOW (ref >60)
Glucose, Bld: 110 mg/dL — ABNORMAL HIGH (ref 70–99)
Potassium: 4 mmol/L (ref 3.5–5.1)
Sodium: 141 mmol/L (ref 135–145)

## 2023-11-12 LAB — PROTIME-INR
INR: 2.7 — ABNORMAL HIGH (ref 0.8–1.2)
Prothrombin Time: 29 s — ABNORMAL HIGH (ref 11.4–15.2)

## 2023-11-12 LAB — LACTATE DEHYDROGENASE: LDH: 177 U/L (ref 98–192)

## 2023-11-12 MED ORDER — GABAPENTIN 400 MG PO CAPS
400.0000 mg | ORAL_CAPSULE | Freq: Three times a day (TID) | ORAL | 6 refills | Status: DC
  Filled 2023-11-12: qty 30, 10d supply, fill #0
  Filled 2023-12-03: qty 30, 10d supply, fill #1
  Filled 2023-12-18: qty 30, 10d supply, fill #2
  Filled 2024-01-13: qty 30, 10d supply, fill #3
  Filled 2024-02-16: qty 30, 10d supply, fill #4
  Filled 2024-03-17: qty 30, 10d supply, fill #5
  Filled 2024-04-19 – 2024-04-21 (×2): qty 30, 10d supply, fill #6

## 2023-11-12 MED ORDER — TORSEMIDE 20 MG PO TABS
20.0000 mg | ORAL_TABLET | ORAL | 0 refills | Status: DC | PRN
  Filled 2023-11-12: qty 30, 30d supply, fill #0

## 2023-11-12 NOTE — Progress Notes (Signed)
 Patient presents to VAD Clinic today for 2 month follow up alone. Reports no problem with VAD or equipment.    Pt states he and his family have recovered from recent stomach virus.   Pt does report increased shortness of breath over last week. Dr. Bensimhon gave torsemide  20 mg for three days then take as needed for weight gain, swelling, or increased shortness of breath.   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   Patient confirms he increased his Doxazosin  to 8 mg twice daily as instructed last visit. BP better today.   Vital Signs:  Doppler Pressure: 100 Automatic BP: 101/78 (85) HR: 88 SPO2: 96% on RA   Weight: 308.4 bs lb w/ eqt Last weight:  300.8  lbs w/eqt   VAD Indication: Destination Therapy due to smoking and BMI   VAD interrogation & Equipment Management: Speed: 6200 Flow: 5.0 Power: 5.1w    PI: 3.2 Hct: 40   Alarms: one LV advisory + one LOW VOLTAGE ALARM - patient says he was awake on both occasions and let my batteries run all the way down. He denies sleeping on batteries.  Events: rare  Fixed speed 6200 Low speed limit: 5900   Primary Controller: Replace back up battery in 27 mths Back up controller:  pt did not bring to clinic   Annual Equipment Maintenance on UBC/PM was performed on 06/17/23.    I reviewed the LVAD parameters from today and compared the results to the patient's prior recorded data. LVAD interrogation was NEGATIVE for significant power changes, NEGATIVE for clinical alarms and STABLE for PI events/speed drops. No programming changes were made and pump is functioning within specified parameters. Pt is performing daily controller and system monitor self tests along with completing weekly and monthly maintenance for LVAD equipment.   LVAD equipment check completed and is in good working order. Back-up equipment not present.    Exit Site Care: Drive line being maintained weekly by pt's  wife. Existing dressing CDI. Provided patient with 8 weekly kits and 8 anchors for home use.   Device:  Left subcutaneous dual ICD Therapies: on at 250 BPM Last check: 09/01/23  BP & Labs:  Doppler 100 - Doppler is reflecting modified systolic   Hgb 12.8  - No S/S of bleeding. Specifically denies melena/BRBPR or nosebleeds.   LDH pending with established baseline of 160 - 300. Denies tea-colored urine. No power elevations noted on interrogation.   Plan:  Take torsemide  20 mg for three days then only as needed for wt gain, swelling, or increase SOB. Labs today - will call you if abnormal.  Return to VAD Clinic in 2 months - see below. Please call or page us  if any questions prior to next visit.    Geofm Christmas RN,BSN VAD Coordinator  Office: [phone]  24/7 Pager: [phone]

## 2023-11-12 NOTE — Patient Instructions (Addendum)
 Take torsemide  20 mg for three days then only as needed for wt gain, swelling, or increase SOB. Labs today - will call you if abnormal.  Return to VAD Clinic in 2 months - see below. Please call or page us  if any questions prior to next visit.

## 2023-11-14 ENCOUNTER — Other Ambulatory Visit (HOSPITAL_COMMUNITY): Payer: Self-pay

## 2023-11-14 ENCOUNTER — Other Ambulatory Visit: Payer: Self-pay

## 2023-11-14 DIAGNOSIS — E1122 Type 2 diabetes mellitus with diabetic chronic kidney disease: Secondary | ICD-10-CM

## 2023-11-14 NOTE — Progress Notes (Signed)
 11/14/2023 Name: Jeffrey [name] MRN: [id number] DOB: [birth year]  No chief complaint on file.   Jeffrey [name] is a 48 y.o. year old male who presented for a telephone visit.   They were referred to the pharmacist by their PCP for assistance in managing diabetes.    Subjective:  Care Team: Primary Care Provider: Oley Bascom RAMAN, NP ; Next Scheduled Visit: 01/05/2024 Cardiologist: Dr. Bensimhon; Next Scheduled Visit: 01/06/2024  Medication Access/Adherence  Current Pharmacy:  [facility] - [facility] [address], [address] [place] [address] Phone: [phone] Fax: [phone]  [id number] - [place], [place] - [address] AT [facility] [address] [place] [address] Phone: [phone] Fax: [phone]   Patient reports affordability concerns with their medications: No  Patient reports access/transportation concerns to their pharmacy: No  Patient reports adherence concerns with their medications:  No  - pt's wife fills pill box   Diabetes:  Current medications: metformin  1000 mg BID Medications tried in the past: Ozempic  (stopped due to lack of insurance coverage), Jardiance  (unclear why this was stopped)  Patient reports he would like to restart Ozempic  for BG and weight loss now that he has insurance coverage. Reports he tolerated the medication well in the past with no issues. States he was able to titrate up to the 2 mg dose.  Using glucometer; testing 3 times daily BG at home no higher than 100, no lower than 75.  Patient denies hypoglycemic s/sx including dizziness, shakiness, sweating. Patient denies hyperglycemic symptoms including polyuria, polydipsia, polyphagia, nocturia, neuropathy, blurred vision.  Current meal patterns: 3 meals/day - Breakfast: fruit, turkey sausage, eggs, grits - Lunch: sandwich, fruit - Supper: meat, vegetable - Snacks: fruit - Drinks: water   only   Current physical activity: none, states he walks every now and then   Current medication access support: [place] Medicaid  Heart Failure (s/p LVAD):  Current medications:  ACEi/ARB/ARNI: Entresto  97-103 mg BID SGLT2i: none Beta blocker: none Mineralocorticoid Receptor Antagonist: spironolactone  25 mg daily Diuretic regimen: torsemide  20 mg prn Other: Bidil  20-37.5 mg - 2 tablets TID, doxazosin  8 mg BID  Current home blood pressure readings: 105/79 this AM Current home weights: 288.5 lb this AM  Patient initially stated he thought he was not taking Entresto  anymore, but then said he wasn't sure. Entresto  was last filled 1/24 for 30 day supply. Patient reports he saw Dr. Cherrie on Friday, 2/7 and was instructed to take torsemide  20 mg daily for 3 days then return to as needed dosing. He reports significant improvement in SOB over the past couple days with daily dosing. States he is feeling back to his baseline.  Current medication access support: [place] Medicaid   Objective:  Lab Results  Component Value Date   HGBA1C 5.7 (A) 10/07/2023    Lab Results  Component Value Date   CREATININE 1.47 (H) 11/12/2023   BUN 19 11/12/2023   NA 141 11/12/2023   K 4.0 11/12/2023   CL 111 11/12/2023   CO2 19 (L) 11/12/2023    Lab Results  Component Value Date   CHOL 123 03/18/2023   HDL 46 03/18/2023   LDLCALC 53 03/18/2023   TRIG 138 03/18/2023   CHOLHDL 2.7 03/18/2023    Medications Reviewed Today     Reviewed by Bonnell Izetta SAUNDERS, RPH (Pharmacist) on 11/14/23 at 1013  Med List Status: <None>   Medication Order Taking? Sig Documenting Provider Last Dose  Status Informant  acetaminophen  (TYLENOL ) 325 MG tablet [id number] Yes Take 2 tablets (650 mg total) by mouth every 6 (six) hours as needed for fever. Lenetta Greig BIRCH, NP Taking Active Self, Pharmacy Records           Med Note [facility]   Thu Mar 18, 2023  2:30 PM) PRN   allopurinol  (ZYLOPRIM ) 100 MG tablet  [id number] Yes Take 1 tablet (100 mg total) by mouth daily. Bensimhon, Toribio SAUNDERS, MD Taking Active   amiodarone  (PACERONE ) 200 MG tablet [id number] Yes Take 1 tablet (200 mg total) by mouth daily. Bensimhon, Toribio SAUNDERS, MD Taking Active   amLODipine  (NORVASC ) 10 MG tablet [id number] Yes Take 1 tablet (10 mg total) by mouth daily Nichols, Tonya S, NP Taking Active   Blood Pressure KIT [id number]  1 Units by Does not apply route daily. Oley Bascom RAMAN, NP  Active   cefadroxil  (DURICEF) 500 MG capsule [id number] Yes Take 1 capsule (500 mg total) by mouth 2 (two) times daily. Bensimhon, Toribio SAUNDERS, MD Taking Active   colchicine  0.6 MG tablet [id number] Yes Take 1 tablet (0.6 mg total) by mouth once daily. Bensimhon, Toribio SAUNDERS, MD Taking Active   cyanocobalamin  (VITAMIN B12) 1000 MCG tablet [id number] Yes Take 1 tablet (1,000 mcg total) by mouth daily. Oley Bascom RAMAN, NP Taking Active   cyclobenzaprine  (FLEXERIL ) 5 MG tablet [id number] Yes Take 1 tablet (5 mg total) by mouth 3 (three) times daily as needed for muscle spasms  Taking Active   doxazosin  (CARDURA ) 4 MG tablet [id number] Yes Take 2 tablets (8 mg total) by mouth 2 (two) times daily. Bensimhon, Toribio SAUNDERS, MD Taking Active   ferrous sulfate  325 (65 FE) MG tablet [id number] Yes Take 1 tablet (325 mg total) by mouth daily before breakfast. Oley Bascom RAMAN, NP Taking Active   folic acid  (FOLVITE ) 800 MCG tablet [id number] Yes Take 0.5 tablets (400 mcg total) by mouth daily. Oley Bascom RAMAN, NP Taking Active   gabapentin  (NEURONTIN ) 400 MG capsule [id number] Yes Take 1 capsule (400 mg total) by mouth 3 (three) times daily. Bensimhon, Toribio SAUNDERS, MD Taking Active   glucose blood test strip [id number]  Use as directed 4 times daily. Shannan Sia I, NP  Active Self, Pharmacy Records  isosorbide -hydrALAZINE  (BIDIL ) 20-37.5 MG tablet [id number] Yes Take 2 tablets by mouth 3 (three) times daily. Bensimhon, Toribio SAUNDERS, MD Taking Active   meclizine  (ANTIVERT ) 25 MG tablet  [id number] Yes Take 1 tablet (25 mg total) by mouth 3 (three) times daily as needed for dizziness. Bensimhon, Toribio SAUNDERS, MD Taking Active Self, Pharmacy Records           Med Note [place], DOROTHYANN ONEIDA [place] May 05, 2023  9:08 AM) More dizziness/vertigo lately  metFORMIN  (GLUCOPHAGE ) 1000 MG tablet [id number] Yes Take 1 tablet (1,000 mg total) by mouth 2 (two) times daily with a meal. Bensimhon, Toribio SAUNDERS, MD Taking Active   ondansetron  (ZOFRAN -ODT) 4 MG disintegrating tablet [id number] Yes Take 1 tablet (4 mg total) by mouth every 8 (eight) hours as needed for nausea or vomiting. Zenaida Morene PARAS, MD Taking Active   pantoprazole  (PROTONIX ) 40 MG tablet [id number] Yes Take 1 tablet (40 mg total) by mouth once daily. Bensimhon, Toribio SAUNDERS, MD Taking Active   rosuvastatin  (CRESTOR ) 10 MG tablet [id number] Yes Take 1 tablet (10 mg total) by mouth daily. Bensimhon, Toribio SAUNDERS, MD Taking Active   sacubitril -valsartan  (ENTRESTO ) 97-103 MG [id number] Yes Take 1 tablet by mouth 2 (two)  times daily. Bensimhon, Toribio SAUNDERS, MD Taking Active Self, Pharmacy Records  sertraline  (ZOLOFT ) 100 MG tablet [id number] Yes Take 1.5 tablets (150 mg total) by mouth daily. Bensimhon, Toribio SAUNDERS, MD Taking Active   spironolactone  (ALDACTONE ) 25 MG tablet [id number] Yes Take 1 tablet (25 mg total) by mouth daily. Bensimhon, Toribio SAUNDERS, MD Taking Active   torsemide  (DEMADEX ) 20 MG tablet [id number] Yes Take 1 tablet (20 mg total) by mouth as needed. Bensimhon, Toribio SAUNDERS, MD Taking Active   traMADol  HCl 100 MG TABS [id number] No Take 1 tablet (100 mg) by mouth every 6 (six) hours as needed for pain  Patient not taking: Reported on 11/14/2023   Bensimhon, Toribio SAUNDERS, MD Not Taking Active   traZODone  (DESYREL ) 100 MG tablet [id number] Yes Take 1 tablet (100 mg total) by mouth at bedtime. Bensimhon, Toribio SAUNDERS, MD Taking Active   warfarin (COUMADIN ) 4 MG tablet [id number] Yes Take 2 tablet (8 mg) daily or as directed by HF clinic  Patient taking differently:  Take 8 mg by mouth daily at 4 PM. Take 3 tablets (12 mg) on Tuesdays, and 2 tablet (8 mg) all other days, or as directed by HF clinic   Bensimhon, Toribio SAUNDERS, MD Taking Active            Med Note PARALEE, ISAIAH NOVAK   Fri Dec 11, 2022 11:56 AM)                Assessment/Plan:   Diabetes: - Currently controlled based on last A1c 5.7% on 10/07/23 and patient reported home BG readings at goal. Patient would benefit from Ozempic  given comorbidities obesity and CKD. - Reviewed long term cardiovascular and renal outcomes of uncontrolled blood sugar - Reviewed goal A1c, goal fasting, and goal 2 hour post prandial glucose - Reviewed dietary modifications including incorporating protein with each meal, limiting intake of carbohydrates - Reviewed lifestyle modifications including: incorporating physical activity in weekly routine - Recommend to start Ozempic  0.25 mg weekly x4 weeks then increase to 0.5 mg weekly. PA has been approved Software Engineer). Will collaborate with PCP to implement medication change. - Recommend to continue metformin  1000 mg BID. - Patient denies personal or family history of multiple endocrine neoplasia type 2, medullary thyroid  cancer; personal history of pancreatitis or gallbladder disease. - Recommend to check FBG daily   Heart Failure: - Currently  [facility] by [facility]  - Patient unsure if he has Entresto  at home, but fill date of 1/24 for 30 day supply and well controlled BP is reassuring. Counseled him to confirm he has medication on hand at home and in his pill box when he gets home and he stated he would do so.  - Patient reports he is not currently taking Jardiance , but HF clinic note from 10/21/23 states on Jardiance . Patient does not remember why the medication was stopped, but he would benefit from SGLT-2 given HF, T2DM, and CKD. Jardiance  is preferred SGLT-2 by his insurance and requires a PA. Will discuss restarting Jardiance  with cardiology.   Follow  Up Plan: PharmD on 12/05/2023  Izetta Henry, PharmD PGY-1 Pharmacy Resident

## 2023-11-15 ENCOUNTER — Telehealth (HOSPITAL_COMMUNITY): Payer: Self-pay

## 2023-11-15 ENCOUNTER — Other Ambulatory Visit: Payer: Self-pay

## 2023-11-15 ENCOUNTER — Other Ambulatory Visit (HOSPITAL_COMMUNITY): Payer: Self-pay

## 2023-11-15 ENCOUNTER — Other Ambulatory Visit (HOSPITAL_COMMUNITY): Payer: Self-pay | Admitting: Pharmacist

## 2023-11-15 MED ORDER — OZEMPIC (0.25 OR 0.5 MG/DOSE) 2 MG/3ML ~~LOC~~ SOPN
PEN_INJECTOR | SUBCUTANEOUS | 1 refills | Status: DC
  Filled 2023-11-15: qty 3, 28d supply, fill #0

## 2023-11-15 MED ORDER — EMPAGLIFLOZIN 10 MG PO TABS
10.0000 mg | ORAL_TABLET | Freq: Every day | ORAL | 11 refills | Status: DC
  Filled 2023-11-15: qty 30, 30d supply, fill #0
  Filled 2023-12-24: qty 30, 30d supply, fill #1
  Filled 2024-01-26: qty 30, 30d supply, fill #2
  Filled 2024-04-19 – 2024-05-22 (×3): qty 30, 30d supply, fill #3

## 2023-11-15 NOTE — Telephone Encounter (Signed)
 Advanced [facility] Patient Advocate Encounter  Prior authorization for Jardiance  has been submitted and approved. Test billing returns $4 for 90 day supply.  Key: [id number] Effective: 11/15/2023 to 11/14/2024  Kennis Peacock, CPhT Rx Patient Advocate Phone: [phone]

## 2023-11-19 ENCOUNTER — Telehealth: Payer: Self-pay

## 2023-11-19 ENCOUNTER — Other Ambulatory Visit: Payer: Self-pay

## 2023-11-19 NOTE — Telephone Encounter (Signed)
Patient called in and stated he is in need of CPAP supplies. Patient used [facility] as his DME previously. Patient would like supplies sent to [facility].

## 2023-11-20 NOTE — Progress Notes (Signed)
 VAD CLINIC Follow-up Visit  PCP: Oley Bascom RAMAN, NP HF MD: DB   HPI:  DOCTOR SHEAHAN is a 48 y.o. yo male with a history of  poorly controlled HTN, R renal cell carcinoma s/p nephrectomy, CKD IIIa, DM2, OSA, gout, morbid obesity and systolic HF due to NICM. 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 D/c'd home with plan for 6 weeks IV Ancef  with plan to transition to cefadroxil  for long-term suppression on 09/28/22.   Here for f/u. On cefadroxil  for chronic DL infection. Recently has stomach virus but now resolved. Notes he has had some LE edema and SOB over past week or so. Has not taken diuretic. Weight up 8 pounds. Denies orthopnea or PND. No fevers, chills or problems with driveline. No bleeding, melena or neuro symptoms. No VAD alarms. Taking all meds as prescribed.     VAD Indication: Destination Therapy due to smoking and BMI   VAD interrogation & Equipment Management: Speed: 6200 Flow: 5.0 Power: 5.1w PI: 3.2 Hct: 40   Alarms: one LV advisory + one LOW VOLTAGE ALARM - patient says he was awake on both occasions and let my batteries run all the way down. He denies sleeping on batteries.  Events: rare  Fixed speed 6200 Low speed limit: 5900   Primary Controller: Replace back up battery in 27 mths Back up controller:  pt did not bring to clinic   Annual Equipment Maintenance on UBC/PM was performed on 06/17/23.    I reviewed the LVAD parameters from today and compared the results to the patient's prior recorded data. LVAD interrogation was NEGATIVE for significant power changes, NEGATIVE for clinical alarms and STABLE for PI events/speed drops. No programming changes were made and pump is functioning within specified parameters. Pt is performing daily controller and system monitor self tests along with completing weekly and monthly maintenance for LVAD equipment.   LVAD equipment check  completed and is in good working order. Back-up equipment not present.      Past Medical History:  Diagnosis Date   Anxiety    Aspirin  allergy    Childhood asthma    Chronic systolic CHF (congestive heart failure) (HCC)    a. EF 20-25% in 2012. b. EF 45-50% in 10/2011 with nonischemic nuc - presumed NICM. c. 12/2014 Echo: Sev depressed LV fxn, sev dil LV, mild LVH, mild MR, sev dil LA, mildly reduced RV fxn.   CKD (chronic kidney disease) stage 2, GFR 60-89 ml/min    H/O vasectomy 12/2019   High cholesterol    Hypertension    Morbid obesity (HCC)    Nephrolithiasis    OSA on CPAP    Paroxysmal atrial fibrillation (HCC)    Presumed NICM    a. 04/2014 Myoview : EF 26%, glob HK, sev glob HK, ? prior infarct;  b. Never cathed 2/2 CKD.   Renal cell carcinoma (HCC)    a. s/p Rt robotic assisted partial converted to radical nephrectomy on 01/2013.   Troponin level elevated    a. 04/2014, 12/2014: felt due to CHF.   Type II diabetes mellitus (HCC)    Ventricular tachycardia (HCC)    a. appropriate ICD therapy 12/2017    Current Outpatient Medications  Medication Sig Dispense Refill   acetaminophen  (TYLENOL ) 325 MG tablet Take 2 tablets (650 mg total) by mouth every 6 (six) hours as needed for fever.  allopurinol  (ZYLOPRIM ) 100 MG tablet Take 1 tablet (100 mg total) by mouth daily. 90 tablet 3   amiodarone  (PACERONE ) 200 MG tablet Take 1 tablet (200 mg total) by mouth daily. 30 tablet 11   amLODipine  (NORVASC ) 10 MG tablet Take 1 tablet (10 mg total) by mouth daily 30 tablet 6   Blood Pressure KIT 1 Units by Does not apply route daily. 1 kit 0   cefadroxil  (DURICEF) 500 MG capsule Take 1 capsule (500 mg total) by mouth 2 (two) times daily. 180 capsule 3   colchicine  0.6 MG tablet Take 1 tablet (0.6 mg total) by mouth once daily. 30 tablet 6   cyanocobalamin  (VITAMIN B12) 1000 MCG tablet Take 1 tablet (1,000 mcg total) by mouth daily. 30 tablet 0   doxazosin  (CARDURA ) 4 MG tablet Take 2  tablets (8 mg total) by mouth 2 (two) times daily. 120 tablet 6   ferrous sulfate  325 (65 FE) MG tablet Take 1 tablet (325 mg total) by mouth daily before breakfast. 90 tablet 1   folic acid  (FOLVITE ) 800 MCG tablet Take 0.5 tablets (400 mcg total) by mouth daily. 30 tablet 2   glucose blood test strip Use as directed 4 times daily. 100 each 11   isosorbide -hydrALAZINE  (BIDIL ) 20-37.5 MG tablet Take 2 tablets by mouth 3 (three) times daily. 180 tablet 6   metFORMIN  (GLUCOPHAGE ) 1000 MG tablet Take 1 tablet (1,000 mg total) by mouth 2 (two) times daily with a meal. 60 tablet 11   pantoprazole  (PROTONIX ) 40 MG tablet Take 1 tablet (40 mg total) by mouth once daily. 90 tablet 3   rosuvastatin  (CRESTOR ) 10 MG tablet Take 1 tablet (10 mg total) by mouth daily. 90 tablet 3   sacubitril -valsartan  (ENTRESTO ) 97-103 MG Take 1 tablet by mouth 2 (two) times daily. 60 tablet 6   sertraline  (ZOLOFT ) 100 MG tablet Take 1.5 tablets (150 mg total) by mouth daily. 45 tablet 6   spironolactone  (ALDACTONE ) 25 MG tablet Take 1 tablet (25 mg total) by mouth daily. 60 tablet 6   torsemide  (DEMADEX ) 20 MG tablet Take 1 tablet (20 mg total) by mouth as needed. 30 tablet 0   traZODone  (DESYREL ) 100 MG tablet Take 1 tablet (100 mg total) by mouth at bedtime. 30 tablet 11   warfarin (COUMADIN ) 4 MG tablet Take 2 tablet (8 mg) daily or as directed by HF clinic (Patient taking differently: Take 8 mg by mouth daily at 4 PM. Take 3 tablets (12 mg) on Tuesdays, and 2 tablet (8 mg) all other days, or as directed by HF clinic) 90 tablet 11   cyclobenzaprine  (FLEXERIL ) 5 MG tablet Take 1 tablet (5 mg total) by mouth 3 (three) times daily as needed for muscle spasms 15 tablet 0   empagliflozin  (JARDIANCE ) 10 MG TABS tablet Take 1 tablet (10 mg total) by mouth daily before breakfast. 30 tablet 11   gabapentin  (NEURONTIN ) 400 MG capsule Take 1 capsule (400 mg total) by mouth 3 (three) times daily. 30 capsule 6   meclizine  (ANTIVERT ) 25  MG tablet Take 1 tablet (25 mg total) by mouth 3 (three) times daily as needed for dizziness. 90 tablet 2   ondansetron  (ZOFRAN -ODT) 4 MG disintegrating tablet Take 1 tablet (4 mg total) by mouth every 8 (eight) hours as needed for nausea or vomiting. 5 tablet 0   Semaglutide ,0.25 or 0.5MG /DOS, (OZEMPIC , 0.25 OR 0.5 MG/DOSE,) 2 MG/3ML SOPN Inject 0.25 mg into the skin once weekly for 4 weeks, then increase  to 0.5 mg once weekly. 3 mL 1   traMADol  HCl 100 MG TABS Take 1 tablet (100 mg) by mouth every 6 (six) hours as needed for pain (Patient not taking: Reported on 11/14/2023) 90 tablet 0   No current facility-administered medications for this encounter.    Aspirin , Bee venom, Lisinopril , and Tomato  REVIEW OF SYSTEMS: All systems negative except as listed in HPI, PMH and Problem list.   Vitals:   11/12/23 1118 11/12/23 1119  BP: 101/78 (!) 100/0  Pulse: 88   SpO2: 96%   Weight: (!) 139.9 kg (308 lb 6.4 oz)   Height: 5' 7 (1.702 m)     Vital Signs:  Doppler Pressure: 100 Automatic BP: 101/78 (85) HR: 88 SPO2: 96% on RA   Weight: 308.4 bs lb w/ eqt Last weight:  300.8  lbs w/eqt       Exam: General:  NAD.  HEENT: normal  Neck: supple. JVP 8-9 Carotids 2+ bilat; no bruits. No lymphadenopathy or thryomegaly appreciated. Cor: LVAD hum.  Lungs: Clear. Abdomen: obese soft, nontender, non-distended. No hepatosplenomegaly. No bruits or masses. Good bowel sounds. Driveline site clean. Anchor in place.  Extremities: no cyanosis, clubbing, rash. Warm 2+ edema  Neuro: alert & oriented x 3. No focal deficits. Moves all 4 without problem   ASSESSMENT AND PLAN:  .1. Chronic systolic CHF - Nonischemic CM, end-stage - EF 2012; 20-25%  - Echo  8/22: EF < 20%, severe LV dilation, moderate RV dysfunction.   - RHC (8/22): well compensated hemodynamics. - s/p S-ICD - S/p HM3 LVAD 10/17 (DT) - Doing well. NYHA I-II  - Mild volume overload in setting of probable dietary indiscretion -  Take torsemide  20 mg daily for 3 days. Discussed use of sliding scale. - Continue Bidil  2 tabs tid - Continue spironolactone  25 mg daily  - Continue entresto  to 97/130 bid - Discussed need to lose weight for transplant eval to get to BMI ~35  Body mass index is 48.3 kg/m. Now back on OZempic   2. VAD  - s/p HM-3 placement 07/21/21  - VAD interrogated personally. Parameters stable. - INR 2.7 Goal INR 2.0-3.0 Discussed warfarin dosing with PharmD personally. . - LDH 177 - DL site much improved Continue cefadroxil  for suppression. Followed by ID.  - Hgb 12.8  3. DL site infection - Resolved - Continue cefadroxil  for long-term suppression  4 Atrial fibrillation: Paroxysmal - Had severe AF with RVR 7/22 s/p DC-CV x 2 - Seen by EP. Not a candidate for ablation due to body habitus  (could consider AVN ablation and CRT, if needed)  - Had post-op AF with RVR post VAD - 10/23 S/P DC-CV with restoration of NSR. - Remains in NSR. Continue amio 100 daily - On warfarin  5. HTN - MAPs now under control with titration of doxazosin   6. CKD stage 3B  - Has solitary kidney.   - Baseline creatinine is 1.5-1.8.  - Creatinine1.47 today   7. OSA - Continue CPAP   8. DM2  - On Jardiance . - Hgb A1c 9.4. -> 5.9 - PCP following - Continue Crestor  10  9. Polyarticular gout. - quiescent  10. Depression/anxiety - Continue Zoloft  150mg  daily - Much improved  11. Morbid obesity - Body mass index is 48.3 kg/m. - Continue Ozempic .  - Discussed eed for weight loss for transplant  12.  Palpitations/dizziness - Zio 12/23 - 77 runs SVT. Continue amio. PVCs 1.9% PACs 4.7% - resolved  I spent a total  of 41 minutes today: 1) reviewing the patient's medical records including previous charts, labs and recent notes from other providers; 2) examining the patient and counseling them on their medical issues/explaining the plan of care; 3) adjusting meds as needed and 4) ordering lab work or other  needed tests.   Toribio Fuel, MD  9:23 PM

## 2023-11-23 ENCOUNTER — Ambulatory Visit: Payer: Medicaid Other | Attending: Cardiology | Admitting: Cardiology

## 2023-11-23 ENCOUNTER — Encounter: Payer: Self-pay | Admitting: Cardiology

## 2023-11-23 VITALS — Ht 67.0 in | Wt 289.0 lb

## 2023-11-23 DIAGNOSIS — G4733 Obstructive sleep apnea (adult) (pediatric): Secondary | ICD-10-CM

## 2023-11-23 NOTE — Progress Notes (Signed)
SLEEP MEDICINE VIRTUAL CONSULT NOTE via Video Note   Because of Jeffrey [name]'s co-morbid illnesses, he is at least at moderate risk for complications without adequate follow up.  This format is felt to be most appropriate for this patient at this time.  All issues noted in this document were discussed and addressed.  A limited physical exam was performed with this format.  Please refer to the patient's chart for his consent to telehealth for [facility].      Date:  11/23/2023   ID:  Jeffrey [name], DOB 11/27/1975, MRN [id number] The patient was identified using 2 identifiers.  Patient Location: Home Provider Location: Home Office   PCP:  Ivonne Andrew, NP   [facility] Providers Cardiologist:  Arvilla Meres, MD Electrophysiologist:  Sherryl Manges, MD     Evaluation Performed:  New Patient Evaluation  Chief Complaint:  OSA  History of Present Illness:    Jeffrey [name] is a 48 y.o. male who is being seen today for the evaluation of OSA at the request of Arvilla Meres, MD.  Jeffrey [name] is a 48 y.o. male with a hx of OSA on CPAP, chronic systolic CHF/NICM s/p LVAD, PAF, morbid obesity, renal cell CA s/p right nephrectomy, DM, VT s/p AICD, HTN who is referred for Sleep Medicine consultation for OSA and CPAP therapy.    He had a split-night sleep done on 11/07/2016 which demonstrated severe obstructive and AHI 62.9/h and no  central events.  He had significant nocturnal hypoxemia with O2 saturations as low as 70%.  He had a CPAP titration to 13 cm H2O and was started CPAP.  He was referred to sleep medicine clinic but never followed through.  He is now referred by Dr. Gala Romney for sleep medicine consultation to establish sleep care and treatment of obstructive sleep apnea  He is doing well with his PAP device and thinks that he has gotten used to it.  He tolerates the nasal pillow mask and feels the pressure is adequate.  Since going on PAP he feels rested  in the am but does have some daytime sleepiness but not like he used to.  He denies any significant mouth or nasal dryness or nasal congestion.  He does not think that he snores.  He needs new supplies.   Past Medical History:  Diagnosis Date   Anxiety    Aspirin allergy    Childhood asthma    Chronic systolic CHF (congestive heart failure) (HCC)    a. EF 20-25% in 2012. b. EF 45-50% in 10/2011 with nonischemic nuc - presumed NICM. c. 12/2014 Echo: Sev depressed LV fxn, sev dil LV, mild LVH, mild MR, sev dil LA, mildly reduced RV fxn.   CKD (chronic kidney disease) stage 2, GFR 60-89 ml/min    H/O vasectomy 12/2019   High cholesterol    Hypertension    Morbid obesity (HCC)    Nephrolithiasis    OSA on CPAP    Paroxysmal atrial fibrillation (HCC)    Presumed NICM    a. 04/2014 Myoview: EF 26%, glob HK, sev glob HK, ? prior infarct;  b. Never cathed 2/2 CKD.   Renal cell carcinoma (HCC)    a. s/p Rt robotic assisted partial converted to radical nephrectomy on 01/2013.   Troponin level elevated    a. 04/2014, 12/2014: felt due to CHF.   Type II diabetes mellitus (HCC)    Ventricular tachycardia (HCC)  a. appropriate ICD therapy 12/2017   Past Surgical History:  Procedure Laterality Date   APPENDECTOMY  07/2004   APPLICATION OF WOUND VAC N/A 08/12/2022   Procedure: APPLICATION OF WOUND VAC;  Surgeon: Lovett Sox, MD;  Location: [facility] OR;  Service: Thoracic;  Laterality: N/A;   APPLICATION OF WOUND VAC N/A 08/18/2022   Procedure: WOUND VAC CHANGE;  Surgeon: Lovett Sox, MD;  Location: [facility] OR;  Service: Thoracic;  Laterality: N/A;   BIOPSY  02/10/2021   Procedure: BIOPSY;  Surgeon: Iva Boop, MD;  Location: [facility] ENDOSCOPY;  Service: Endoscopy;;   CARDIAC CATHETERIZATION N/A 05/17/2015   Procedure: Right/Left Heart Cath and Coronary Angiography;  Surgeon: Dolores Patty, MD;  Location: [facility] INVASIVE CV LAB;  Service: Cardiovascular;  Laterality: N/A;   CARDIOVERSION N/A 04/30/2021    Procedure: CARDIOVERSION;  Surgeon: Laurey Morale, MD;  Location: [facility] ENDOSCOPY;  Service: Cardiovascular;  Laterality: N/A;   CARDIOVERSION N/A 07/27/2021   Procedure: CARDIOVERSION;  Surgeon: Dolores Patty, MD;  Location: [facility] OR;  Service: Cardiovascular;  Laterality: N/A;   COLONOSCOPY WITH PROPOFOL N/A 02/10/2021   Procedure: COLONOSCOPY WITH PROPOFOL;  Surgeon: Iva Boop, MD;  Location: [facility] ENDOSCOPY;  Service: Endoscopy;  Laterality: N/A;   EP IMPLANTABLE DEVICE N/A 05/17/2015   Procedure: SubQ ICD Implant;  Surgeon: Will Jorja Loa, MD;  Location: [facility] INVASIVE CV LAB;  Service: Cardiovascular;  Laterality: N/A;   ESOPHAGOGASTRODUODENOSCOPY (EGD) WITH PROPOFOL N/A 02/10/2021   Procedure: ESOPHAGOGASTRODUODENOSCOPY (EGD) WITH PROPOFOL;  Surgeon: Iva Boop, MD;  Location: [facility] ENDOSCOPY;  Service: Endoscopy;  Laterality: N/A;   INSERTION OF IMPLANTABLE LEFT VENTRICULAR ASSIST DEVICE N/A 07/21/2021   Procedure: INSERTION OF IMPLANTABLE LEFT VENTRICULAR ASSIST DEVICE;  Surgeon: Alleen Borne, MD;  Location: [facility] OR;  Service: Open Heart Surgery;  Laterality: N/A;   RIGHT HEART CATH N/A 05/13/2021   Procedure: RIGHT HEART CATH;  Surgeon: Dolores Patty, MD;  Location: [facility] INVASIVE CV LAB;  Service: Cardiovascular;  Laterality: N/A;   RIGHT HEART CATH N/A 07/18/2021   Procedure: RIGHT HEART CATH;  Surgeon: Dolores Patty, MD;  Location: [facility] INVASIVE CV LAB;  Service: Cardiovascular;  Laterality: N/A;   ROBOTIC ASSISTED LAPAROSCOPIC LYSIS OF ADHESION  01/13/2013   Procedure: ROBOTIC ASSISTED LAPAROSCOPIC LYSIS OF ADHESION EXTENSIVE;  Surgeon: Sebastian Ache, MD;  Location: [facility] ORS;  Service: Urology;;   ROBOTIC ASSITED PARTIAL NEPHRECTOMY Right 01/13/2013   Procedure: ROBOTIC ASSITED PARTIAL NEPHRECTOMY CONVERTED TO ROBOTIC ASSISTED RIGHT RADICAL NEPHRECTOMY;  Surgeon: Sebastian Ache, MD;  Location: [facility] ORS;  Service: Urology;  Laterality: Right;   STERNAL WOUND DEBRIDEMENT N/A  08/12/2022   Procedure: ABDOMINAL WOUND DEBRIDEMENT;  Surgeon: Lovett Sox, MD;  Location: [facility] OR;  Service: Thoracic;  Laterality: N/A;   STERNAL WOUND DEBRIDEMENT N/A 08/18/2022   Procedure: ABDOMINAL WOUND DEBRIDEMENT;  Surgeon: Lovett Sox, MD;  Location: [facility] OR;  Service: Thoracic;  Laterality: N/A;   SUBQ ICD CHANGEOUT N/A 05/16/2020   Procedure: KGUR ICD CHANGEOUT;  Surgeon: Lanier Prude, MD;  Location: [facility] INVASIVE CV LAB;  Service: Cardiovascular;  Laterality: N/A;   SUBQ ICD CHANGEOUT N/A 05/15/2020   Procedure: SUBQ ICD CHANGEOUT;  Surgeon: Duke Salvia, MD;  Location: [facility] INVASIVE CV LAB;  Service: Cardiovascular;  Laterality: N/A;   TEE WITHOUT CARDIOVERSION N/A 04/30/2021   Procedure: TRANSESOPHAGEAL ECHOCARDIOGRAM (TEE);  Surgeon: Laurey Morale, MD;  Location: [facility] ENDOSCOPY;  Service: Cardiovascular;  Laterality: N/A;   TEE WITHOUT CARDIOVERSION N/A 07/21/2021  Procedure: TRANSESOPHAGEAL ECHOCARDIOGRAM (TEE);  Surgeon: Alleen Borne, MD;  Location: [facility] OR;  Service: Open Heart Surgery;  Laterality: N/A;   VASECTOMY       Current Meds  Medication Sig   acetaminophen (TYLENOL) 325 MG tablet Take 2 tablets (650 mg total) by mouth every 6 (six) hours as needed for fever.   allopurinol (ZYLOPRIM) 100 MG tablet Take 1 tablet (100 mg total) by mouth daily.   amiodarone (PACERONE) 200 MG tablet Take 1 tablet (200 mg total) by mouth daily.   amLODipine (NORVASC) 10 MG tablet Take 1 tablet (10 mg total) by mouth daily   Blood Pressure KIT 1 Units by Does not apply route daily.   cefadroxil (DURICEF) 500 MG capsule Take 1 capsule (500 mg total) by mouth 2 (two) times daily.   colchicine 0.6 MG tablet Take 1 tablet (0.6 mg total) by mouth once daily.   cyanocobalamin (VITAMIN B12) 1000 MCG tablet Take 1 tablet (1,000 mcg total) by mouth daily.   cyclobenzaprine (FLEXERIL) 5 MG tablet Take 1 tablet (5 mg total) by mouth 3 (three) times daily as needed for muscle spasms    doxazosin (CARDURA) 4 MG tablet Take 2 tablets (8 mg total) by mouth 2 (two) times daily.   empagliflozin (JARDIANCE) 10 MG TABS tablet Take 1 tablet (10 mg total) by mouth daily before breakfast.   ferrous sulfate 325 (65 FE) MG tablet Take 1 tablet (325 mg total) by mouth daily before breakfast.   folic acid (FOLVITE) 800 MCG tablet Take 0.5 tablets (400 mcg total) by mouth daily.   gabapentin (NEURONTIN) 400 MG capsule Take 1 capsule (400 mg total) by mouth 3 (three) times daily.   glucose blood test strip Use as directed 4 times daily.   isosorbide-hydrALAZINE (BIDIL) 20-37.5 MG tablet Take 2 tablets by mouth 3 (three) times daily.   meclizine (ANTIVERT) 25 MG tablet Take 1 tablet (25 mg total) by mouth 3 (three) times daily as needed for dizziness.   metFORMIN (GLUCOPHAGE) 1000 MG tablet Take 1 tablet (1,000 mg total) by mouth 2 (two) times daily with a meal.   ondansetron (ZOFRAN-ODT) 4 MG disintegrating tablet Take 1 tablet (4 mg total) by mouth every 8 (eight) hours as needed for nausea or vomiting.   pantoprazole (PROTONIX) 40 MG tablet Take 1 tablet (40 mg total) by mouth once daily.   rosuvastatin (CRESTOR) 10 MG tablet Take 1 tablet (10 mg total) by mouth daily.   sacubitril-valsartan (ENTRESTO) 97-103 MG Take 1 tablet by mouth 2 (two) times daily.   Semaglutide,0.25 or 0.5MG /DOS, (OZEMPIC, 0.25 OR 0.5 MG/DOSE,) 2 MG/3ML SOPN Inject 0.25 mg into the skin once weekly for 4 weeks, then increase to 0.5 mg once weekly.   sertraline (ZOLOFT) 100 MG tablet Take 1.5 tablets (150 mg total) by mouth daily.   spironolactone (ALDACTONE) 25 MG tablet Take 1 tablet (25 mg total) by mouth daily.   torsemide (DEMADEX) 20 MG tablet Take 1 tablet (20 mg total) by mouth as needed.   traZODone (DESYREL) 100 MG tablet Take 1 tablet (100 mg total) by mouth at bedtime.   warfarin (COUMADIN) 4 MG tablet Take 2 tablet (8 mg) daily or as directed by HF clinic (Patient taking differently: Take 8 mg by mouth  daily at 4 PM. Take 3 tablets (12 mg) on Tuesdays, and 2 tablet (8 mg) all other days, or as directed by HF clinic)     Allergies:   Aspirin, Bee venom, Lisinopril, and  Tomato   Social History   Tobacco Use   Smoking status: Former    Current packs/day: 0.00    Average packs/day: 0.3 packs/day for 22.0 years (5.5 ttl pk-yrs)    Types: Cigarettes    Start date: 04/29/1993    Quit date: 04/30/2015    Years since quitting: 8.5   Smokeless tobacco: Never  Vaping Use   Vaping status: Never Used  Substance Use Topics   Alcohol use: Not Currently    Comment: none   Drug use: Yes    Types: Marijuana     Family Hx: The patient's family history includes Diabetes in his maternal aunt; Hypertension in his mother. There is no history of Heart attack, Stroke, Colon cancer, Pancreatic cancer, Stomach cancer, Liver cancer, or Esophageal cancer.  ROS:   Please see the history of present illness.     All other systems reviewed and are negative.   Prior Sleep studies:   The following studies were reviewed today:  Split night sleep study  Labs/Other Tests and Data Reviewed:     Recent Labs: 06/17/2023: ALT 26; TSH 3.115 11/12/2023: BUN 19; Creatinine, Ser 1.47; Hemoglobin 12.8; Platelets 198; Potassium 4.0; Sodium 141    Wt Readings from Last 3 Encounters:  11/23/23 289 lb (131.1 kg)  11/12/23 (!) 308 lb 6.4 oz (139.9 kg)  10/07/23 (!) 308 lb 12.8 oz (140.1 kg)        Objective:    Vital Signs:  Ht 5\' 7"  (1.702 m)   Wt 289 lb (131.1 kg)   BMI 45.26 kg/m    VITAL SIGNS:  reviewed GEN:  no acute distress EYES:  sclerae anicteric, EOMI - Extraocular Movements Intact RESPIRATORY:  normal respiratory effort, symmetric expansion CARDIOVASCULAR:  no peripheral edema SKIN:  no rash, lesions or ulcers. MUSCULOSKELETAL:  no obvious deformities. NEURO:  alert and oriented x 3, no obvious focal deficit PSYCH:  normal affect  ASSESSMENT & PLAN:    OSA - The patient is tolerating PAP  therapy well without any problems. The PAP download performed by his DME was personally reviewed and interpreted by me today and showed an AHI of 0.6/hr on 13 cm H2O with 97% compliance in using more than 4 hours nightly.  The patient has been using and benefiting from PAP use and will continue to benefit from therapy.  -I will order him new supplies -His device is older than 5 years so will order him a new ResMed CPAP at 13cm H2O with heated humidity and nasal pillow mask -he will see me back 6 weeks after getting new device   Time:   Today, I have spent 15 minutes with the patient with telehealth technology discussing the above problems.     Medication Adjustments/Labs and Tests Ordered: Current medicines are reviewed at length with the patient today.  Concerns regarding medicines are outlined above.   Tests Ordered: No orders of the defined types were placed in this encounter.   Medication Changes: No orders of the defined types were placed in this encounter.   Follow Up: 6 week after getting new device  Signed, Armanda Magic, MD  11/23/2023 9:28 AM    [facility]

## 2023-11-24 ENCOUNTER — Telehealth: Payer: Self-pay

## 2023-11-24 DIAGNOSIS — G4733 Obstructive sleep apnea (adult) (pediatric): Secondary | ICD-10-CM

## 2023-11-24 DIAGNOSIS — T827XXA Infection and inflammatory reaction due to other cardiac and vascular devices, implants and grafts, initial encounter: Secondary | ICD-10-CM

## 2023-11-24 DIAGNOSIS — I420 Dilated cardiomyopathy: Secondary | ICD-10-CM

## 2023-11-24 DIAGNOSIS — R0989 Other specified symptoms and signs involving the circulatory and respiratory systems: Secondary | ICD-10-CM

## 2023-11-24 DIAGNOSIS — I5022 Chronic systolic (congestive) heart failure: Secondary | ICD-10-CM

## 2023-11-24 DIAGNOSIS — Z95811 Presence of heart assist device: Secondary | ICD-10-CM

## 2023-11-24 NOTE — Telephone Encounter (Signed)
Notified patient via telephone that order for new CPAP supplies and nasal pillows mask has been sent to [facility] 2/19.

## 2023-11-25 ENCOUNTER — Other Ambulatory Visit (HOSPITAL_COMMUNITY): Payer: Self-pay | Admitting: *Deleted

## 2023-11-25 DIAGNOSIS — I5022 Chronic systolic (congestive) heart failure: Secondary | ICD-10-CM

## 2023-11-25 DIAGNOSIS — Z95811 Presence of heart assist device: Secondary | ICD-10-CM

## 2023-11-25 DIAGNOSIS — Z7901 Long term (current) use of anticoagulants: Secondary | ICD-10-CM

## 2023-11-25 NOTE — Addendum Note (Signed)
Addended by: Brunetta Genera on: 11/25/2023 12:58 PM   Modules accepted: Orders

## 2023-11-29 ENCOUNTER — Other Ambulatory Visit: Payer: Self-pay | Admitting: Obstetrics and Gynecology

## 2023-11-29 NOTE — Patient Outreach (Signed)
 Medicaid Managed Care   Nurse Care Manager Note  11/29/2023 Name:  Jeffrey [name] MRN:  [id number] DOB:  [birth year]  Jeffrey [name] is an 48 y.o. year old male who is a primary patient of Ivonne Andrew, NP.  [facility] Managed Care Coordination team was consulted for assistance with:    Chronic healthcare management needs, CHF, CKD, gout, DM, HTN, h/o adrenal cancer, OSA, VAD, DLD, HLD, depression  Mr. Senkbeil was given information about Medicaid Managed Care Coordination team services today. Jeffrey [name] Patient agreed to services and verbal consent obtained.  Engaged with patient by telephone for follow up visit in response to provider referral for case management and/or care coordination services.   Patient is participating in a Managed Medicaid Plan:  Yes  Assessments/Interventions:  Review of past medical history, allergies, medications, health status, including review of consultants reports, laboratory and other test data, was performed as part of comprehensive evaluation and provision of chronic care management services.  SDOH (Social Drivers of Health) assessments and interventions performed: SDOH Interventions    Flowsheet Row Patient Outreach Telephone from 11/29/2023 in [place] POPULATION HEALTH DEPARTMENT Patient Outreach Telephone from 10/27/2023 in [place] POPULATION HEALTH DEPARTMENT Patient Outreach Telephone from 10/18/2023 in [place] POPULATION HEALTH DEPARTMENT Patient Outreach Telephone from 09/08/2023 in [place] POPULATION HEALTH DEPARTMENT Patient Outreach Telephone from 06/30/2023 in [place] POPULATION HEALTH DEPARTMENT Patient Outreach Telephone from 04/29/2023 in [place] POPULATION HEALTH DEPARTMENT  SDOH Interventions        Food Insecurity Interventions Intervention Not Indicated -- -- -- -- Intervention Not Indicated  Housing Interventions -- -- -- -- Intervention Not Indicated --  Transportation Interventions -- -- Intervention Not Indicated -- --  --  Utilities Interventions -- -- -- -- Intervention Not Indicated --  Alcohol Usage Interventions -- Intervention Not Indicated (Score <7) -- -- -- --  Financial Strain Interventions -- -- -- Other (Comment)  [Medicaid approved, disability one time payment, to f/u BSW] -- --  Physical Activity Interventions -- -- Intervention Not Indicated -- -- --  Stress Interventions -- -- -- Other (Comment)  [food stamps and Medicaid now, Disability one time payment, patient will f/u] -- --  Health Literacy Interventions Intervention Not Indicated -- -- -- -- Intervention Not Indicated     Care Plan Allergies  Allergen Reactions   Aspirin Shortness Of Breath, Itching and Rash     Burning sensation (Patient reports he tolerates other NSAIDS)    Bee Venom Hives and Swelling   Lisinopril Cough and Other (See Comments)   Tomato Hives   Medications Reviewed Today     Reviewed by Danie Chandler, RN (Registered Nurse) on 11/29/23 at [id number]  Med List Status: <None>   Medication Order Taking? Sig Documenting Provider Last Dose Status Informant  acetaminophen (TYLENOL) 325 MG tablet [id number] No Take 2 tablets (650 mg total) by mouth every 6 (six) hours as needed for fever. Sherald Hess, NP Taking Active Self, Pharmacy Records           Med Note [facility], [facility]   Thu Mar 18, 2023  2:30 PM) PRN   allopurinol (ZYLOPRIM) 100 MG tablet [id number] No Take 1 tablet (100 mg total) by mouth daily. Bensimhon, Bevelyn Buckles, MD Taking Active   amiodarone (PACERONE) 200 MG tablet [id number] No Take 1 tablet (200 mg total) by mouth daily. Bensimhon, Bevelyn Buckles, MD Taking Active   amLODipine (NORVASC) 10 MG tablet [id number] No Take 1 tablet (  10 mg total) by mouth daily Ivonne Andrew, NP Taking Active   Blood Pressure KIT [id number] No 1 Units by Does not apply route daily. Ivonne Andrew, NP Taking Active   cefadroxil (DURICEF) 500 MG capsule [id number] No Take 1 capsule (500 mg total) by mouth 2 (two) times daily.  Bensimhon, Bevelyn Buckles, MD Taking Active   colchicine 0.6 MG tablet [id number] No Take 1 tablet (0.6 mg total) by mouth once daily. Bensimhon, Bevelyn Buckles, MD Taking Active   cyanocobalamin (VITAMIN B12) 1000 MCG tablet [id number] No Take 1 tablet (1,000 mcg total) by mouth daily. Ivonne Andrew, NP Taking Active   cyclobenzaprine (FLEXERIL) 5 MG tablet [id number] No Take 1 tablet (5 mg total) by mouth 3 (three) times daily as needed for muscle spasms  Taking Active   doxazosin (CARDURA) 4 MG tablet [id number] No Take 2 tablets (8 mg total) by mouth 2 (two) times daily. Bensimhon, Bevelyn Buckles, MD Taking Active   empagliflozin (JARDIANCE) 10 MG TABS tablet [id number] No Take 1 tablet (10 mg total) by mouth daily before breakfast. Bensimhon, Bevelyn Buckles, MD Taking Active   ferrous sulfate 325 (65 FE) MG tablet [id number] No Take 1 tablet (325 mg total) by mouth daily before breakfast. Ivonne Andrew, NP Taking Active   folic acid (FOLVITE) 800 MCG tablet [id number] No Take 0.5 tablets (400 mcg total) by mouth daily. Ivonne Andrew, NP Taking Active   gabapentin (NEURONTIN) 400 MG capsule [id number] No Take 1 capsule (400 mg total) by mouth 3 (three) times daily. Bensimhon, Bevelyn Buckles, MD Taking Active   glucose blood test strip [id number] No Use as directed 4 times daily. Orion Crook I, NP Taking Active Self, Pharmacy Records  isosorbide-hydrALAZINE (BIDIL) 20-37.5 MG tablet [id number] No Take 2 tablets by mouth 3 (three) times daily. Bensimhon, Bevelyn Buckles, MD Taking Active   meclizine (ANTIVERT) 25 MG tablet [id number] No Take 1 tablet (25 mg total) by mouth 3 (three) times daily as needed for dizziness. Bensimhon, Bevelyn Buckles, MD Taking Active Self, Pharmacy Records           Med Note [place], Desma Paganini May 05, 2023  9:08 AM) More dizziness/vertigo lately  metFORMIN (GLUCOPHAGE) 1000 MG tablet [id number] No Take 1 tablet (1,000 mg total) by mouth 2 (two) times daily with a meal. Bensimhon, Bevelyn Buckles, MD Taking  Active   ondansetron (ZOFRAN-ODT) 4 MG disintegrating tablet [id number] No Take 1 tablet (4 mg total) by mouth every 8 (eight) hours as needed for nausea or vomiting. Romie Minus, MD Taking Active   pantoprazole (PROTONIX) 40 MG tablet [id number] No Take 1 tablet (40 mg total) by mouth once daily. Bensimhon, Bevelyn Buckles, MD Taking Active   rosuvastatin (CRESTOR) 10 MG tablet [id number] No Take 1 tablet (10 mg total) by mouth daily. Bensimhon, Bevelyn Buckles, MD Taking Active   sacubitril-valsartan (ENTRESTO) 97-103 MG [id number] No Take 1 tablet by mouth 2 (two) times daily. Bensimhon, Bevelyn Buckles, MD Taking Active Self, Pharmacy Records  Semaglutide,0.25 or 0.5MG /DOS, (OZEMPIC, 0.25 OR 0.5 MG/DOSE,) 2 MG/3ML SOPN [id number] No Inject 0.25 mg into the skin once weekly for 4 weeks, then increase to 0.5 mg once weekly. Ivonne Andrew, NP Taking Active   sertraline (ZOLOFT) 100 MG tablet [id number] No Take 1.5 tablets (150 mg total) by mouth daily. Bensimhon, Bevelyn Buckles, MD Taking Active   spironolactone (ALDACTONE) 25 MG tablet [id number] No Take 1 tablet (25 mg total) by mouth daily. Bensimhon,  Bevelyn Buckles, MD Taking Active   torsemide (DEMADEX) 20 MG tablet [id number] No Take 1 tablet (20 mg total) by mouth as needed. Bensimhon, Bevelyn Buckles, MD Taking Active   traMADol HCl 100 MG TABS [id number] No Take 1 tablet (100 mg) by mouth every 6 (six) hours as needed for pain  Patient not taking: Reported on 11/23/2023   Bensimhon, Bevelyn Buckles, MD Not Taking Active   traZODone (DESYREL) 100 MG tablet [id number] No Take 1 tablet (100 mg total) by mouth at bedtime. Bensimhon, Bevelyn Buckles, MD Taking Active   warfarin (COUMADIN) 4 MG tablet [id number] No Take 2 tablet (8 mg) daily or as directed by HF clinic  Patient taking differently: Take 8 mg by mouth daily at 4 PM. 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/10/2022   Presence of left ventricular assist device (LVAD) (HCC) 07/25/2021   Acute on chronic systolic CHF (congestive heart failure) (HCC) 07/18/2021   Cardiogenic shock (HCC)    Atrial fibrillation with RVR (HCC) 04/30/2021   Acute hypoxemic respiratory failure (HCC)    Right wrist pain 04/16/2021   Absolute anemia    Rectal bleeding    Iron deficiency 12/25/2020   Long term current use of anticoagulant - apixaban 12/25/2020   Paroxysmal atrial fibrillation (HCC)    Lactic acidosis 11/22/2020   Hypotension 11/22/2020   Aspiration into airway    ICD (implantable cardioverter-defibrillator) battery depletion 05/15/2020   Endotracheal tube present    Respiratory failure (HCC)    Acute encephalopathy    Acute pulmonary edema (HCC)    NICM (nonischemic cardiomyopathy) (HCC) 04/24/2020   S/P vasectomy 12/20/2019   Anxiety 12/20/2019   Hemoglobin A1C between 7% and 9% indicating borderline diabetic control (HCC) 12/20/2019   Seasonal allergies 12/20/2019   Insomnia 12/20/2019   Vasectomy evaluation 06/20/2019   Visual problems 06/20/2019   Blurry vision, bilateral 06/20/2019   Hyperglycemia 02/13/2019   Hyperosmolar (nonketotic) coma (HCC) 01/19/2019   AICD (automatic cardioverter/defibrillator) present 01/19/2019   Hyperlipidemia 12/07/2018   Follow-up exam 11/22/2018   Class 3 severe obesity due to excess calories with serious comorbidity and body mass index (BMI) of 45.0 to 49.9 in adult (HCC) 11/22/2018   Dizziness 11/22/2018   Lingular pneumonia 08/04/2018   Ventricular tachycardia (HCC) 12/30/2017   PAF (paroxysmal atrial fibrillation) (HCC)    Dilated cardiomyopathy (HCC)    Pollen allergy 02/17/2017   Dyslipidemia 02/17/2017   Hematochezia 11/08/2015   CKD (chronic kidney  disease), stage III (HCC) 06/20/2015   Cardiac defibrillator in situ    Chronic systolic CHF (congestive heart failure) (HCC) 03/11/2015   Gouty arthritis 03/11/2015   Chest pain 12/28/2014   Acute renal failure superimposed on stage 3 chronic kidney disease (HCC) 04/08/2014   Aspirin allergy 04/08/2014   Renal cell carcinoma (HCC)    Gout attack 11/10/2011   Morbid obesity (HCC) 10/23/2011   OSA (obstructive sleep apnea) 10/23/2011   Type 2 diabetes mellitus with stage 3 chronic kidney disease, with long-term current use of insulin (HCC)  10/22/2011   Hyperlipidemia associated with type 2 diabetes mellitus (HCC) 12/25/2010   Hypertension associated with diabetes (HCC) 12/25/2010   Conditions to be addressed/monitored per PCP order:  Chronic healthcare management needs, CHF, CKD, gout, DM, HTN, h/o adrenal cancer, OSA, VAD, DLD, HLD, depression  Care Plan : General Plan of Care (Adult)  Updates made by Danie Chandler, RN since 11/29/2023 12:00 AM     Problem: Health Promotion or Disease Self-Management (General Plan of Care)   Priority: High  Onset Date: 01/29/2023  Note:   Current Barriers:  Chronic Disease Management support and education needs, S/P LVAD 07/21/21, DM, HTN, OSA, CHF, Afib, CKD, gout, DDD, HLD, depression,  h/o renal Ca 11/29/23: No complaints today.  BP and BG stable.  Upcoming appts with GI, PCP , Heart and Vascular.  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    Nurse Case Manager Clinical Goal(s):  Over the next 90 days, patient will attend all scheduled medical appointments: Over the next 90 days, patient will have dental appt scheduled  Interventions:  Inter-disciplinary care team collaboration (see longitudinal plan of care) Collaborated with BSW  BSW referral for disability information-completed Evaluation of current treatment plan and patient's adherence to plan as established by provider. .  Reviewed medications with  patient. Discussed plans with patient for ongoing care management follow up and provided patient with direct contact information for care management team Provided patient with educational materials related to exercise. Reviewed scheduled/upcoming provider appointments. Collaborated with Pharmacy Pharmacy referral for medication review 10/27/23:  BSW appt scheduled at patient request.  AFIB Interventions: (Status:  New goal.) Long Term Goal   Reviewed importance of adherence to anticoagulant exactly as prescribed Counseled on importance of regular laboratory monitoring as prescribed Afib action plan reviewed Assessed social determinant of health barriers  Heart Failure Interventions:  (Status:  New goal.) Long Term Goal Basic overview and discussion of pathophysiology of Heart Failure reviewed Discussed the importance of keeping all appointments with provider Assessed social determinant of health barriers    Chronic Kidney Disease Interventions:  (Status:  New goal.) Long Term Goal Evaluation of current treatment plan related to chronic kidney disease self management and patient's adherence to plan as established by provider      Reviewed prescribed diet  Reviewed medications with patient and discussed importance of compliance    Reviewed scheduled/upcoming provider appointments  Assessed social determinant of health barriers    Last practice recorded BP readings:  BP Readings from Last 3 Encounters:  09/08/23  120/90     11/23/23       101/78  10/07/23         98/72  Most recent eGFR/CrCl: No results found for: "EGFR"  No components found for: "CRCL"  Diabetes Interventions:  (Status:  New goal.) Long Term Goal Assessed patient's understanding of A1c goal: <7% Reviewed medications with patient and discussed importance of medication adherence Counseled on importance of regular laboratory monitoring as prescribed Discussed plans with patient for ongoing care management follow up and  provided patient with direct contact information for care management team Reviewed scheduled/upcoming provider appointments  Advised patient, providing education and rationale, to check cbg as directed and record, calling provider  for findings outside established parameters Review of patient status, including review of consultants reports, relevant laboratory and other test results, and medications completed Assessed social determinant of health barriers Lab Results  Component Value Date   HGBA1C 5.3 09/16/2022  HGBA1C                                                      5.7                                    10/07/2023            HGBA1C                                                      5.5                                    03/18/2023  Hyperlipidemia Interventions:  (Status:  New goal.) Long Term Goal Medication review performed; medication list updated in electronic medical record.  Provider established cholesterol goals reviewed Counseled on importance of regular laboratory monitoring as prescribed Reviewed importance of limiting foods high in cholesterol Assessed social determinant of health barriers   Hypertension Interventions:  (Status:  New goal.) Long Term Goal Last practice recorded BP readings:  BP Readings from Last 3 Encounters:  09/08/23  120/90  11/23/23      101/78  10/07/23 98/72   Most recent eGFR/CrCl: No results found for: "EGFR"  No components found for: "CRCL"  Evaluation of current treatment plan related to hypertension self management and patient's adherence to plan as established by provider Reviewed medications with patient and discussed importance of compliance Discussed plans with patient for ongoing care management follow up and provided patient with direct contact information for care management team Advised patient, providing education and rationale, to monitor blood pressure daily and record, calling PCP for findings outside established  parameters Reviewed scheduled/upcoming provider appointments  Assessed social determinant of health barriers   Patient Goals/Self-Care Activities Over the next 90 days, patient will:  -Attends all scheduled provider appointments Calls provider office for new concerns or questionss  Follow Up Plan: The Managed Medicaid care management team will reach out to the patient again over the next 45 business  days.  The patient has been provided with contact information for the Managed Medicaid care management team and has been advised to call with any health related questions or concerns.    Follow Up:  Patient agrees to Care Plan and Follow-up.  Plan: The Managed Medicaid care management team will reach out to the patient again over the next 45 business  days. and The  Patient has been provided with contact information for the Managed Medicaid care management team and has been advised to call with any health related questions or concerns.  Date/time of next scheduled RN care management/care coordination outreach:  01/10/24 at 0900

## 2023-11-29 NOTE — Patient Instructions (Signed)
 Hi Mr. Glidden-great to speak with you this morning-have a terrific day.  Mr. Haynesworth was given information about Medicaid Managed Care team care coordination services as a part of their Healthy [facility] Medicaid benefit. [name] verbally consented to engagement with the [facility] Managed Care team.   If you are experiencing a medical emergency, please call 911 or report to your local emergency department or urgent care.   If you have a non-emergency medical problem during routine business hours, please contact your provider's office and ask to speak with a nurse.   For questions related to your Healthy [facility] health plan, please call: [phone] or visit the homepage here: [email or web address]  If you would like to schedule transportation through your Healthy [facility] plan, please call the following number at least 2 days in advance of your appointment: [phone]  For information about your ride after you set it up, call Ride Assist at [phone]. Use this number to activate a Will Call pickup, or if your transportation is late for a scheduled pickup. Use this number, too, if you need to make a change or cancel a previously scheduled reservation.  If you need transportation services right away, call [phone]. The after-hours call center is staffed 24 hours to handle ride assistance and urgent reservation requests (including discharges) 365 days a year. Urgent trips include sick visits, hospital discharge requests and life-sustaining treatment.  Call the [facility] Line at [phone], at any time, 24 hours a day, 7 days a week. If you are in danger or need immediate medical attention call 911.  If you would like help to quit smoking, call 1-800-QUIT-NOW (660-525-0987) OR Espaol: 1-855-Djelo-Ya (1-601-093-2355) o para ms informacin haga clic aqu or Text READY to 732-202 to register via text  Mr. Macphee - following are the  goals we discussed in your visit today:   Goals Addressed             This Visit's Progress    Protect My Health       Timeframe:  Long-Range Goal Priority:  High Start Date:    09/24/20                         Expected End Date:  ongoing           Follow up date: 01/10/24 - schedule recommended health tests  - schedule and keep appointment for annual check-up   11/29/23: Upcoming GI, PCP, and Heart and Vascular appts.   Patient verbalizes understanding of instructions and care plan provided today and agrees to view in MyChart. Active MyChart status and patient understanding of how to access instructions and care plan via MyChart confirmed with patient.     The Managed Medicaid care management team will reach out to the patient again over the next 45 business  days.  The  Patient  has been provided with contact information for the Managed Medicaid care management team and has been advised to call with any health related questions or concerns.   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  BP and BG stable.  Upcoming appts with GI, PCP , Heart and Vascular.  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    Nurse Case Manager Clinical Goal(s):  Over the next 90 days, patient will attend all scheduled medical appointments: Over the next 90 days, patient will have dental appt  scheduled  Interventions:  Inter-disciplinary care team collaboration (see longitudinal plan of care) Collaborated with BSW  BSW referral for disability information-completed Evaluation of current treatment plan and patient's adherence to plan as established by provider. .  Reviewed medications with patient. Discussed plans with patient for ongoing care management follow up and provided patient with direct contact information for care management team Provided patient with educational materials related to exercise. Reviewed scheduled/upcoming provider appointments. Collaborated with Pharmacy Pharmacy referral for medication review 10/27/23:  BSW appt scheduled at patient request.  AFIB Interventions: (Status:  New goal.) Long Term Goal   Reviewed importance of adherence to anticoagulant exactly as prescribed Counseled on importance of regular laboratory monitoring as prescribed Afib action plan reviewed Assessed social determinant of health barriers  Heart Failure Interventions:  (Status:  New goal.) Long Term Goal Basic overview and discussion of pathophysiology of Heart Failure reviewed Discussed the importance of keeping all appointments with provider Assessed social determinant of health barriers    Chronic Kidney Disease Interventions:  (Status:  New goal.) Long Term Goal Evaluation of current treatment plan related to chronic kidney disease self management and patient's adherence to plan as established by provider      Reviewed prescribed diet  Reviewed medications with patient and discussed importance of compliance    Reviewed scheduled/upcoming provider appointments  Assessed social determinant of health barriers    Last practice recorded BP readings:  BP Readings from Last 3 Encounters:  09/08/23  120/90     11/23/23       101/78  10/07/23         98/72  Most recent eGFR/CrCl: No results found for: "EGFR"  No components found for: "CRCL"  Diabetes Interventions:   (Status:  New goal.) Long Term Goal Assessed patient's understanding of A1c goal: <7% Reviewed medications with patient and discussed importance of medication adherence Counseled on importance of regular laboratory monitoring as prescribed Discussed plans with patient for ongoing care management follow up and provided patient with direct contact information for care management team Reviewed scheduled/upcoming provider appointments  Advised patient, providing education and rationale, to check cbg as directed and record, calling provider  for findings outside established parameters Review of patient status, including review of consultants reports, relevant laboratory and other test results, and medications completed Assessed social determinant of health barriers Lab Results  Component Value Date   HGBA1C 5.3 09/16/2022             HGBA1C                                                      5.7  10/07/2023            HGBA1C                                                      5.5                                    03/18/2023  Hyperlipidemia Interventions:  (Status:  New goal.) Long Term Goal Medication review performed; medication list updated in electronic medical record.  Provider established cholesterol goals reviewed Counseled on importance of regular laboratory monitoring as prescribed Reviewed importance of limiting foods high in cholesterol Assessed social determinant of health barriers   Hypertension Interventions:  (Status:  New goal.) Long Term Goal Last practice recorded BP readings:  BP Readings from Last 3 Encounters:  09/08/23  120/90  11/23/23      101/78  10/07/23 98/72   Most recent eGFR/CrCl: No results found for: "EGFR"  No components found for: "CRCL"  Evaluation of current treatment plan related to hypertension self management and patient's adherence to plan as established by provider Reviewed medications with patient and discussed  importance of compliance Discussed plans with patient for ongoing care management follow up and provided patient with direct contact information for care management team Advised patient, providing education and rationale, to monitor blood pressure daily and record, calling PCP for findings outside established parameters Reviewed scheduled/upcoming provider appointments  Assessed social determinant of health barriers   Patient Goals/Self-Care Activities Over the next 90 days, patient will:  -Attends all scheduled provider appointments Calls provider office for new concerns or questionss  Follow Up Plan: The Managed Medicaid care management team will reach out to the patient again over the next 45 business  days.  The patient has been provided with contact information for the Managed Medicaid care management team and has been advised to call with any health related questions or concerns.

## 2023-12-01 ENCOUNTER — Ambulatory Visit (INDEPENDENT_AMBULATORY_CARE_PROVIDER_SITE_OTHER): Payer: Medicaid Other

## 2023-12-01 DIAGNOSIS — I420 Dilated cardiomyopathy: Secondary | ICD-10-CM

## 2023-12-02 ENCOUNTER — Telehealth: Payer: Self-pay

## 2023-12-02 ENCOUNTER — Ambulatory Visit (HOSPITAL_COMMUNITY)
Admission: RE | Admit: 2023-12-02 | Discharge: 2023-12-02 | Disposition: A | Discharge status: Home / Self Care | Payer: Medicaid Other | Source: Ambulatory Visit | Attending: Cardiology | Admitting: Cardiology

## 2023-12-02 ENCOUNTER — Ambulatory Visit (HOSPITAL_COMMUNITY): Payer: Self-pay | Admitting: Pharmacist

## 2023-12-02 DIAGNOSIS — Z7901 Long term (current) use of anticoagulants: Secondary | ICD-10-CM | POA: Insufficient documentation

## 2023-12-02 DIAGNOSIS — Z4509 Encounter for adjustment and management of other cardiac device: Secondary | ICD-10-CM | POA: Diagnosis not present

## 2023-12-02 DIAGNOSIS — I5022 Chronic systolic (congestive) heart failure: Secondary | ICD-10-CM | POA: Diagnosis not present

## 2023-12-02 DIAGNOSIS — Z95811 Presence of heart assist device: Secondary | ICD-10-CM | POA: Diagnosis not present

## 2023-12-02 LAB — CUP PACEART REMOTE DEVICE CHECK
Battery Remaining Percentage: 69 %
Date Time Interrogation Session: 20250227012000
HighPow Impedance: 65 Ohm
Implantable Lead Connection Status: 753985
Implantable Lead Implant Date: 20160812
Implantable Lead Location: 753862
Implantable Lead Model: 3401
Implantable Pulse Generator Implant Date: 20210812
Pulse Gen Serial Number: 139514

## 2023-12-02 LAB — PROTIME-INR
INR: 2.7 — ABNORMAL HIGH (ref 0.8–1.2)
Prothrombin Time: 28.8 s — ABNORMAL HIGH (ref 11.4–15.2)

## 2023-12-02 NOTE — Telephone Encounter (Signed)
 Alert received from [employer] for 1 untreated episode 2/26 @ 16:59, duration 32sec, EGM c/w sustained VT, some over-sensing, below shock zone of 250 bpm.    Patient has not been seen since 2021. Patient called to advise of episode, reports he as asymptomatic. Compliant with Amiodarone 200 mg daily, entresto 97-103 mg BID, coumadin & torsemide prn. Patient advised of ED precautions for symptoms or syncope event. Routing to EP scheduler for urgent OV and Dr. Gala Romney who patient is currently UTD on OV's.

## 2023-12-03 ENCOUNTER — Other Ambulatory Visit: Payer: Self-pay

## 2023-12-04 NOTE — Telephone Encounter (Signed)
 At first blush, I would interpret this as SVT with abberation with some TWOS

## 2023-12-05 ENCOUNTER — Other Ambulatory Visit: Payer: Self-pay

## 2023-12-05 DIAGNOSIS — E1122 Type 2 diabetes mellitus with diabetic chronic kidney disease: Secondary | ICD-10-CM

## 2023-12-05 NOTE — Progress Notes (Signed)
 12/05/2023 Name: Jeffrey [name] MRN: [id number] DOB: 1975/11/17  Chief Complaint  Patient presents with   Diabetes   Medication Management    Jeffrey [name] is a 48 y.o. year old male who presented for a telephone visit.   They were referred to the pharmacist by their PCP for assistance in managing diabetes.    Care Team: Primary Care Provider: Ivonne Andrew, NP ; Next Scheduled Visit: 01/05/2024 Cardiologist: Dr. Gala Romney; Next Scheduled Visit: 01/06/2024  Medication Access/Adherence  Current Pharmacy:  [facility] [address], [address] [place] [address] Phone: [phone] Fax: [phone]  [id number] - [place], [place] - [address] AT [facility] [address] [place] [address] Phone: [phone] Fax: [phone]   Patient reports affordability concerns with their medications: No  Patient reports access/transportation concerns to their pharmacy: No  Patient reports adherence concerns with their medications:  No     Diabetes:  Current medications: Ozempic 0.25 mg weekly (Sundays), metformin 1000 mg BID  Tolerating Ozempic well so far. Took his 3rd dose of Ozempic 0.25 mg this morning. Has had some diarrhea, but states this was occurring prior to starting Ozempic and he attributes this to colchicine. Also notes increased belching since starting on Ozempic, but says he has been out of pantoprazole. Plans to pick up from pharmacy tomorrow. Of note, patient is currently fasting ~12 hrs per day for Ramadan.   Current glucose readings: ranges 70-100, 90 this AM Using glucometer; testing 3 times daily  Patient denies hypoglycemic s/sx including dizziness, shakiness, sweating. Patient denies hyperglycemic symptoms including polyuria, polydipsia, polyphagia, nocturia, neuropathy, blurred vision.  Current meal patterns: 2 meals/day - Breakfast: blueberries,  raspberries, apple - Supper: meat, vegetable, cut out starch and having larger portion of veggies instead - Snacks: no snacks  - Drinks: water only   Current physical activity: none, states he walks every now and then   Current medication access support: [place] Medicaid  Heart Failure (s/p LVAD):  Current medications:  ACEi/ARB/ARNI: Entresto 97-103 mg BID SGLT2i: Jardiance 10 mg daily Beta blocker: none Mineralocorticoid Receptor Antagonist: spironolactone 25 mg daily Diuretic regimen: torsemide 20 mg prn Other: Bidil 20-37.5 mg - 2 tablets TID, doxazosin 8 mg BID  Patient confirmed he restarted Jardiance since last visit and has been taking Entresto as prescribed.  Current home blood pressure readings: 111/89 yesterday Current home weights: 288 lbs  Patient denies volume overload signs or symptoms including shortness of breath, lower extremity edema, increased use of pillows at night   Objective:  Lab Results  Component Value Date   HGBA1C 5.7 (A) 10/07/2023    Lab Results  Component Value Date   CREATININE 1.47 (H) 11/12/2023   BUN 19 11/12/2023   NA 141 11/12/2023   K 4.0 11/12/2023   CL 111 11/12/2023   CO2 19 (L) 11/12/2023    Lab Results  Component Value Date   CHOL 123 03/18/2023   HDL 46 03/18/2023   LDLCALC 53 03/18/2023   TRIG 138 03/18/2023   CHOLHDL 2.7 03/18/2023    Medications Reviewed Today     Reviewed by Vela Prose, RPH (Pharmacist) on 12/05/23 at 1357  Med List Status: <None>   Medication Order Taking? Sig Documenting Provider Last Dose Status Informant  acetaminophen (TYLENOL) 325 MG tablet [id number]  Take 2 tablets (650 mg total) by mouth every 6 (six) hours as needed for fever. Filbert Schilder, Amy  D, NP  Active Self, Pharmacy Records           Med Note [facility], [place]   Thu Mar 18, 2023  2:30 PM) PRN   allopurinol (ZYLOPRIM) 100 MG tablet [id number]  Take 1 tablet (100 mg total) by mouth daily. Bensimhon, Bevelyn Buckles, MD  Active    amiodarone (PACERONE) 200 MG tablet [id number]  Take 1 tablet (200 mg total) by mouth daily. Bensimhon, Bevelyn Buckles, MD  Active   amLODipine (NORVASC) 10 MG tablet [id number]  Take 1 tablet (10 mg total) by mouth daily Ivonne Andrew, NP  Active   Blood Pressure KIT [id number]  1 Units by Does not apply route daily. Ivonne Andrew, NP  Active   cefadroxil (DURICEF) 500 MG capsule [id number]  Take 1 capsule (500 mg total) by mouth 2 (two) times daily. Bensimhon, Bevelyn Buckles, MD  Active   colchicine 0.6 MG tablet [id number]  Take 1 tablet (0.6 mg total) by mouth once daily. Bensimhon, Bevelyn Buckles, MD  Active   cyanocobalamin (VITAMIN B12) 1000 MCG tablet [id number]  Take 1 tablet (1,000 mcg total) by mouth daily. Ivonne Andrew, NP  Active   cyclobenzaprine (FLEXERIL) 5 MG tablet [id number]  Take 1 tablet (5 mg total) by mouth 3 (three) times daily as needed for muscle spasms   Active   doxazosin (CARDURA) 4 MG tablet [id number] Yes Take 2 tablets (8 mg total) by mouth 2 (two) times daily. Bensimhon, Bevelyn Buckles, MD Taking Active   empagliflozin (JARDIANCE) 10 MG TABS tablet [id number] Yes Take 1 tablet (10 mg total) by mouth daily before breakfast. Bensimhon, Bevelyn Buckles, MD Taking Active   ferrous sulfate 325 (65 FE) MG tablet [id number]  Take 1 tablet (325 mg total) by mouth daily before breakfast. Ivonne Andrew, NP  Active   folic acid (FOLVITE) 800 MCG tablet [id number]  Take 0.5 tablets (400 mcg total) by mouth daily. Ivonne Andrew, NP  Active   gabapentin (NEURONTIN) 400 MG capsule [id number]  Take 1 capsule (400 mg total) by mouth 3 (three) times daily. Bensimhon, Bevelyn Buckles, MD  Active   glucose blood test strip [id number]  Use as directed 4 times daily. Orion Crook I, NP  Active Self, Pharmacy Records  isosorbide-hydrALAZINE (BIDIL) 20-37.5 MG tablet [id number] Yes Take 2 tablets by mouth 3 (three) times daily. Bensimhon, Bevelyn Buckles, MD Taking Active   meclizine (ANTIVERT) 25 MG tablet [id number]  Take 1  tablet (25 mg total) by mouth 3 (three) times daily as needed for dizziness. Bensimhon, Bevelyn Buckles, MD  Active Self, Pharmacy Records           Med Note [place], Desma Paganini May 05, 2023  9:08 AM) More dizziness/vertigo lately  metFORMIN (GLUCOPHAGE) 1000 MG tablet [id number] Yes Take 1 tablet (1,000 mg total) by mouth 2 (two) times daily with a meal. Bensimhon, Bevelyn Buckles, MD Taking Active   ondansetron (ZOFRAN-ODT) 4 MG disintegrating tablet [id number]  Take 1 tablet (4 mg total) by mouth every 8 (eight) hours as needed for nausea or vomiting. Romie Minus, MD  Active   pantoprazole (PROTONIX) 40 MG tablet [id number] No Take 1 tablet (40 mg total) by mouth once daily.  Patient not taking: Reported on 12/05/2023   Bensimhon, Bevelyn Buckles, MD Not Taking Active   rosuvastatin (CRESTOR) 10 MG tablet [id number]  Take 1 tablet (10 mg total) by mouth daily. Bensimhon, Bevelyn Buckles, MD  Active   sacubitril-valsartan (ENTRESTO) 97-103 MG  [id number] Yes Take 1 tablet by mouth 2 (two) times daily. Bensimhon, Bevelyn Buckles, MD Taking Active Self, Pharmacy Records  Semaglutide,0.25 or 0.5MG /DOS, (OZEMPIC, 0.25 OR 0.5 MG/DOSE,) 2 MG/3ML SOPN [id number] Yes Inject 0.25 mg into the skin once weekly for 4 weeks, then increase to 0.5 mg once weekly. Ivonne Andrew, NP Taking Active   sertraline (ZOLOFT) 100 MG tablet [id number]  Take 1.5 tablets (150 mg total) by mouth daily. Bensimhon, Bevelyn Buckles, MD  Active   spironolactone (ALDACTONE) 25 MG tablet [id number] Yes Take 1 tablet (25 mg total) by mouth daily. Bensimhon, Bevelyn Buckles, MD Taking Active   torsemide (DEMADEX) 20 MG tablet [id number] Yes Take 1 tablet (20 mg total) by mouth as needed. Bensimhon, Bevelyn Buckles, MD Taking Active   traMADol HCl 100 MG TABS [id number]  Take 1 tablet (100 mg) by mouth every 6 (six) hours as needed for pain  Patient not taking: Reported on 11/23/2023   Bensimhon, Bevelyn Buckles, MD  Active   traZODone (DESYREL) 100 MG tablet [id number]  Take 1 tablet (100 mg  total) by mouth at bedtime. Bensimhon, Bevelyn Buckles, MD  Active   warfarin (COUMADIN) 4 MG tablet [id number]  Take 2 tablet (8 mg) daily or as directed by HF clinic  Patient taking differently: Take 8 mg by mouth daily at 4 PM. Take 3 tablets (12 mg) on Tuesdays, and 2 tablet (8 mg) all other days, or as directed by HF clinic   Bensimhon, Bevelyn Buckles, MD  Active            Med Note Lamar Benes, [place]   Fri Dec 11, 2022 11:56 AM)                Assessment/Plan:   Diabetes: - Currently controlled based on last A1c 5.7% on 10/07/23 and patient reported home BG readings at goal. Patient is a great candidate for Ozempic given comorbidities obesity and CKD. Patient currently fasting for Ramadan. Per ADA risk assessment he is moderate safety risk for religious fasting given CKD and DM duration >10 years. Denies hypoglycemia, stays well hydrated with water, not managed with insulin or sulfonylurea, and monitors BG closely at home. Will continue to monitor.  - Reviewed long term cardiovascular and renal outcomes of uncontrolled blood sugar - Reviewed goal A1c, goal fasting, and goal 2 hour post prandial glucose - Reviewed dietary modifications including limiting intake of carbohydrates and incorporating protein with each meal - Reviewed lifestyle modifications including: incorporating physical activity in weekly routine - Recommend to complete last dose of Ozempic 0.25 mg weekly next Sunday, 3/9, then increase to 0.5 mg weekly - Recommend to continue metformin 1000 mg BID - Patient denies personal or family history of multiple endocrine neoplasia type 2, medullary thyroid cancer; personal history of pancreatitis or gallbladder disease. - Recommend to check glucose FBG daily     Heart Failure: - Currently  managed by [facility] - Reviewed appropriate blood pressure monitoring technique and reviewed goal blood pressure - Reviewed to weigh daily and when to contact cardiology with weight gain -  Recommend to continue current regimen    Follow Up Plan: PharmD on 01/04/2024   Jarrett Ables, PharmD PGY-1 Pharmacy Resident

## 2023-12-06 ENCOUNTER — Other Ambulatory Visit: Payer: Self-pay

## 2023-12-06 ENCOUNTER — Ambulatory Visit (HOSPITAL_COMMUNITY)
Admission: RE | Admit: 2023-12-06 | Discharge: 2023-12-06 | Disposition: A | Discharge status: Home / Self Care | Payer: Medicaid Other | Source: Ambulatory Visit | Attending: Internal Medicine | Admitting: Internal Medicine

## 2023-12-06 DIAGNOSIS — Z452 Encounter for adjustment and management of vascular access device: Secondary | ICD-10-CM | POA: Diagnosis not present

## 2023-12-06 DIAGNOSIS — Z95811 Presence of heart assist device: Secondary | ICD-10-CM

## 2023-12-06 MED ORDER — OZEMPIC (0.25 OR 0.5 MG/DOSE) 2 MG/3ML ~~LOC~~ SOPN
0.5000 mg | PEN_INJECTOR | SUBCUTANEOUS | 1 refills | Status: DC
  Filled 2023-12-06 – 2023-12-28 (×2): qty 3, 28d supply, fill #0

## 2023-12-06 NOTE — Patient Instructions (Signed)
 Provided patient with shower bag for home use. Demonstration along with written instructions and illustrated steps provided.  Patient and caregiver verbalized understanding of same.    Here are some tips for washing:  Avoid getting the driveline exit site dressing wet, and consider planning bathing times around exit site dressing changes. Use only the shower bag we gave you to shower with and don't get creative with your equipment to shower. Water and electricity do not mix and will cause your pump to stop.   Sit on a chair or bathing stool in the bathtub or shower stall, and use a basin of warm water and washcloth or sponge to wash. Or, wash at the sink while standing on a towel, so as not to get the floor or bath rug wet. To wash your hair, try using a hand-held shower wand or sprayer while standing over the kitchen sink or bathtub. Be sure that all floor surfaces are dry when walking around after bathing, to avoid slipping. Do not use powder around the exit site dressing. Anytime your dressing appears wet CHANGE IT! Drink 2 large glasses of water prior to getting in shower for first time (always hydrate before shower). Caregiver needs to be accessible during first few showers. Call VAD Coordinator if any changes in appearance of drive line site.

## 2023-12-06 NOTE — Progress Notes (Signed)
 Pt presented to [facility] Clinic for dressing change with VAD Coordinator to assess readiness to shower.  Existing VAD dressing removed and site care performed using sterile technique. Drive line exit site cleaned with Chlora prep applicators x 2, allowed to dry, and Sorbaview dressing with Silverlon patch applied. Exit site healed and incorporated, the velour is fully implanted at exit site. No redness, tenderness, drainage, foul odor or rash noted. Drive line anchor re-applied. Pt denies fever or chills.   Provided patient with shower bag for home use. Demonstration along with written instructions and illustrated steps provided.  Patient and caregiver verbalized understanding of same.    Here are some tips for washing:  Avoid getting the driveline exit site dressing wet, and consider planning bathing times around exit site dressing changes. Use only the shower bag we gave you to shower with and don't get creative with your equipment to shower. Water and electricity do not mix and will cause your pump to stop.   Sit on a chair or bathing stool in the bathtub or shower stall, and use a basin of warm water and washcloth or sponge to wash. Or, wash at the sink while standing on a towel, so as not to get the floor or bath rug wet. To wash your hair, try using a hand-held shower wand or sprayer while standing over the kitchen sink or bathtub. Be sure that all floor surfaces are dry when walking around after bathing, to avoid slipping. Do not use powder around the exit site dressing. Anytime your dressing appears wet CHANGE IT! Drink 2 large glasses of water prior to getting in shower for first time (always hydrate before shower). Caregiver needs to be accessible during first few showers. Call VAD Coordinator if any changes in appearance of drive line site.   Simmie Davies RN, BSN VAD Coordinator 24/7 Pager [phone]

## 2023-12-07 ENCOUNTER — Encounter: Payer: Self-pay | Admitting: Internal Medicine

## 2023-12-07 ENCOUNTER — Ambulatory Visit: Payer: Medicaid Other | Attending: Internal Medicine | Admitting: Internal Medicine

## 2023-12-07 ENCOUNTER — Other Ambulatory Visit: Payer: Self-pay

## 2023-12-07 VITALS — BP 98/0 | HR 55 | Ht 67.0 in | Wt 309.6 lb

## 2023-12-07 DIAGNOSIS — Z95810 Presence of automatic (implantable) cardiac defibrillator: Secondary | ICD-10-CM

## 2023-12-07 DIAGNOSIS — I480 Paroxysmal atrial fibrillation: Secondary | ICD-10-CM

## 2023-12-07 DIAGNOSIS — I5022 Chronic systolic (congestive) heart failure: Secondary | ICD-10-CM | POA: Diagnosis not present

## 2023-12-07 DIAGNOSIS — Z95811 Presence of heart assist device: Secondary | ICD-10-CM | POA: Diagnosis not present

## 2023-12-07 DIAGNOSIS — I472 Ventricular tachycardia, unspecified: Secondary | ICD-10-CM

## 2023-12-07 NOTE — Patient Instructions (Signed)
 Medication Instructions:  Your physician recommends that you continue on your current medications as directed. Please refer to the Current Medication list given to you today.  *If you need a refill on your cardiac medications before your next appointment, please call your pharmacy*   Lab Work: None ordered.  If you have labs (blood work) drawn today and your tests are completely normal, you will receive your results only by: MyChart Message (if you have MyChart) OR A paper copy in the mail If you have any lab test that is abnormal or we need to change your treatment, we will call you to review the results.   Testing/Procedures: None ordered.    Follow-Up: At [facility], you and your health needs are our priority.  As part of our continuing mission to provide you with exceptional heart care, we have created designated Provider Care Teams.  These Care Teams include your primary Cardiologist (physician) and Advanced Practice Providers (APPs -  Physician Assistants and Nurse Practitioners) who all work together to provide you with the care you need, when you need it.  We recommend signing up for the patient portal called "MyChart".  Sign up information is provided on this After Visit Summary.  MyChart is used to connect with patients for Virtual Visits (Telemedicine).  Patients are able to view lab/test results, encounter notes, upcoming appointments, etc.  Non-urgent messages can be sent to your provider as well.   To learn more about what you can do with MyChart, go to [email or web address].    Your next appointment:   12 months

## 2023-12-07 NOTE — Progress Notes (Signed)
 Patient Care Team: Ivonne Andrew, NP as PCP - General (Pulmonary Disease) Bensimhon, Bevelyn Buckles, MD as PCP - Cardiology (Cardiology) Duke Salvia, MD as PCP - Electrophysiology (Cardiology) Craft, Calvert Cantor, RN as Case Manager Alden Hipp, RPH-CPP (Pharmacist)   HPI  Jeffrey [name] is a 48 y.o. male seen in follow-up for S ICD implanted 8/16, change out 2021  He has nonischemic heart disease and congestive heart failure-- now s/p LVAD  Atrial fibrillation 9/18  Patient denies symptoms of respiratory, GI intolerance, sun sensitivity, neurological symptoms attributable to amiodarone.      Interval SVT with Aberration>> narrow Some TWOS  HR 150   Much improved following LVAD insertion   DATE TEST EF   3/19 Echo   25-30 %   10/22 Echo   <20 %                 Date Cr K Hgb TSH LFTs  6/20 1.77 4.6 12.7 1.874 22   2/25 1.47 4.0 12.8       Thromboembolic risk factors (  HTN-1, DM-1, CHF-1) for a CHADSVASc Score of >=3   Past Medical History:  Diagnosis Date   Anxiety    Aspirin allergy    Childhood asthma    Chronic systolic CHF (congestive heart failure) (HCC)    a. EF 20-25% in 2012. b. EF 45-50% in 10/2011 with nonischemic nuc - presumed NICM. c. 12/2014 Echo: Sev depressed LV fxn, sev dil LV, mild LVH, mild MR, sev dil LA, mildly reduced RV fxn.   CKD (chronic kidney disease) stage 2, GFR 60-89 ml/min    H/O vasectomy 12/2019   High cholesterol    Hypertension    Morbid obesity (HCC)    Nephrolithiasis    OSA on CPAP    Paroxysmal atrial fibrillation (HCC)    Presumed NICM    a. 04/2014 Myoview: EF 26%, glob HK, sev glob HK, ? prior infarct;  b. Never cathed 2/2 CKD.   Renal cell carcinoma (HCC)    a. s/p Rt robotic assisted partial converted to radical nephrectomy on 01/2013.   Troponin level elevated    a. 04/2014, 12/2014: felt due to CHF.   Type II diabetes mellitus (HCC)    Ventricular tachycardia (HCC)    a. appropriate ICD therapy 12/2017     Past Surgical History:  Procedure Laterality Date   APPENDECTOMY  07/2004   APPLICATION OF WOUND VAC N/A 08/12/2022   Procedure: APPLICATION OF WOUND VAC;  Surgeon: Lovett Sox, MD;  Location: [facility] OR;  Service: Thoracic;  Laterality: N/A;   APPLICATION OF WOUND VAC N/A 08/18/2022   Procedure: WOUND VAC CHANGE;  Surgeon: Lovett Sox, MD;  Location: [facility] OR;  Service: Thoracic;  Laterality: N/A;   BIOPSY  02/10/2021   Procedure: BIOPSY;  Surgeon: Iva Boop, MD;  Location: [facility] ENDOSCOPY;  Service: Endoscopy;;   CARDIAC CATHETERIZATION N/A 05/17/2015   Procedure: Right/Left Heart Cath and Coronary Angiography;  Surgeon: Dolores Patty, MD;  Location: [facility] INVASIVE CV LAB;  Service: Cardiovascular;  Laterality: N/A;   CARDIOVERSION N/A 04/30/2021   Procedure: CARDIOVERSION;  Surgeon: Laurey Morale, MD;  Location: [facility] ENDOSCOPY;  Service: Cardiovascular;  Laterality: N/A;   CARDIOVERSION N/A 07/27/2021   Procedure: CARDIOVERSION;  Surgeon: Dolores Patty, MD;  Location[facility] OR;  Service: Cardiovascular;  Laterality: N/A;   COLONOSCOPY WITH PROPOFOL N/A 02/10/2021   Procedure: COLONOSCOPY WITH PROPOFOL;  Surgeon: Iva Boop,  MD;  Location: [facility] ENDOS[facility];  Service: Endoscopy;  Laterality: N/A;   EP IMPLANTABLE DEVICE N/A 05/17/2015   Procedure: SubQ ICD Implant;  Surgeon: Will Jorja Loa, MD;  Location: [facility] INVASIVE CV LAB;  Service: Cardiovascular;  Laterality: N/A;   ESOPHAGOGASTRODUODENOSCOPY (EGD) WITH PROPOFOL N/A 02/10/2021   Procedure: ESOPHAGOGASTRODUODENOSCOPY (EGD) WITH PROPOFOL;  Surgeon: Iva Boop, MD;  Location: [facility] ENDOSCOPY;  Service: Endoscopy;  Laterality: N/A;   INSERTION OF IMPLANTABLE LEFT VENTRICULAR ASSIST DEVICE N/A 07/21/2021   Procedure: INSERTION OF IMPLANTABLE LEFT VENTRICULAR ASSIST DEVICE;  Surgeon: Alleen Borne, MD;  Location: [facility] OR;  Service: Open Heart Surgery;  Laterality: N/A;   RIGHT HEART CATH N/A 05/13/2021   Procedure: RIGHT HEART CATH;   Surgeon: Dolores Patty, MD;  Location: [facility] INVASIVE CV LAB;  Service: Cardiovascular;  Laterality: N/A;   RIGHT HEART CATH N/A 07/18/2021   Procedure: RIGHT HEART CATH;  Surgeon: Dolores Patty, MD;  Location: [facility] INVASIVE CV LAB;  Service: Cardiovascular;  Laterality: N/A;   ROBOTIC ASSISTED LAPAROSCOPIC LYSIS OF ADHESION  01/13/2013   Procedure: ROBOTIC ASSISTED LAPAROSCOPIC LYSIS OF ADHESION EXTENSIVE;  Surgeon: Sebastian Ache, MD;  Location: [facility] ORS;  Service: Urology;;   ROBOTIC ASSITED PARTIAL NEPHRECTOMY Right 01/13/2013   Procedure: ROBOTIC ASSITED PARTIAL NEPHRECTOMY CONVERTED TO ROBOTIC ASSISTED RIGHT RADICAL NEPHRECTOMY;  Surgeon: Sebastian Ache, MD;  Location: [facility] ORS;  Service: Urology;  Laterality: Right;   STERNAL WOUND DEBRIDEMENT N/A 08/12/2022   Procedure: ABDOMINAL WOUND DEBRIDEMENT;  Surgeon: Lovett Sox, MD;  Location: [facility] OR;  Service: Thoracic;  Laterality: N/A;   STERNAL WOUND DEBRIDEMENT N/A 08/18/2022   Procedure: ABDOMINAL WOUND DEBRIDEMENT;  Surgeon: Lovett Sox, MD;  Location: [facility] OR;  Service: Thoracic;  Laterality: N/A;   SUBQ ICD CHANGEOUT N/A 05/16/2020   Procedure: ZOXW ICD CHANGEOUT;  Surgeon: Lanier Prude, MD;  Location: [facility] INVASIVE CV LAB;  Service: Cardiovascular;  Laterality: N/A;   SUBQ ICD CHANGEOUT N/A 05/15/2020   Procedure: SUBQ ICD CHANGEOUT;  Surgeon: Duke Salvia, MD;  Location: [facility] INVASIVE CV LAB;  Service: Cardiovascular;  Laterality: N/A;   TEE WITHOUT CARDIOVERSION N/A 04/30/2021   Procedure: TRANSESOPHAGEAL ECHOCARDIOGRAM (TEE);  Surgeon: Laurey Morale, MD;  Location: [facility] ENDOSCOPY;  Service: Cardiovascular;  Laterality: N/A;   TEE WITHOUT CARDIOVERSION N/A 07/21/2021   Procedure: TRANSESOPHAGEAL ECHOCARDIOGRAM (TEE);  Surgeon: Alleen Borne, MD;  Location: [facility] OR;  Service: Open Heart Surgery;  Laterality: N/A;   VASECTOMY      Current Outpatient Medications  Medication Sig Dispense Refill   acetaminophen (TYLENOL) 325  MG tablet Take 2 tablets (650 mg total) by mouth every 6 (six) hours as needed for fever.     allopurinol (ZYLOPRIM) 100 MG tablet Take 1 tablet (100 mg total) by mouth daily. 90 tablet 3   amiodarone (PACERONE) 200 MG tablet Take 1 tablet (200 mg total) by mouth daily. 30 tablet 11   amLODipine (NORVASC) 10 MG tablet Take 1 tablet (10 mg total) by mouth daily 30 tablet 6   Blood Pressure KIT 1 Units by Does not apply route daily. 1 kit 0   cefadroxil (DURICEF) 500 MG capsule Take 1 capsule (500 mg total) by mouth 2 (two) times daily. 180 capsule 3   colchicine 0.6 MG tablet Take 1 tablet (0.6 mg total) by mouth once daily. 30 tablet 6   cyanocobalamin (VITAMIN B12) 1000 MCG tablet Take 1 tablet (1,000 mcg total) by mouth daily. 30 tablet 0  cyclobenzaprine (FLEXERIL) 5 MG tablet Take 1 tablet (5 mg total) by mouth 3 (three) times daily as needed for muscle spasms 15 tablet 0   doxazosin (CARDURA) 4 MG tablet Take 2 tablets (8 mg total) by mouth 2 (two) times daily. 120 tablet 6   empagliflozin (JARDIANCE) 10 MG TABS tablet Take 1 tablet (10 mg total) by mouth daily before breakfast. 30 tablet 11   ferrous sulfate 325 (65 FE) MG tablet Take 1 tablet (325 mg total) by mouth daily before breakfast. 90 tablet 1   folic acid (FOLVITE) 800 MCG tablet Take 0.5 tablets (400 mcg total) by mouth daily. 30 tablet 2   gabapentin (NEURONTIN) 400 MG capsule Take 1 capsule (400 mg total) by mouth 3 (three) times daily. 30 capsule 6   glucose blood test strip Use as directed 4 times daily. 100 each 11   isosorbide-hydrALAZINE (BIDIL) 20-37.5 MG tablet Take 2 tablets by mouth 3 (three) times daily. 180 tablet 6   meclizine (ANTIVERT) 25 MG tablet Take 1 tablet (25 mg total) by mouth 3 (three) times daily as needed for dizziness. 90 tablet 2   metFORMIN (GLUCOPHAGE) 1000 MG tablet Take 1 tablet (1,000 mg total) by mouth 2 (two) times daily with a meal. 60 tablet 11   ondansetron (ZOFRAN-ODT) 4 MG disintegrating  tablet Take 1 tablet (4 mg total) by mouth every 8 (eight) hours as needed for nausea or vomiting. 5 tablet 0   pantoprazole (PROTONIX) 40 MG tablet Take 1 tablet (40 mg total) by mouth once daily. 90 tablet 3   rosuvastatin (CRESTOR) 10 MG tablet Take 1 tablet (10 mg total) by mouth daily. 90 tablet 3   sacubitril-valsartan (ENTRESTO) 97-103 MG Take 1 tablet by mouth 2 (two) times daily. 60 tablet 6   Semaglutide,0.25 or 0.5MG /DOS, (OZEMPIC, 0.25 OR 0.5 MG/DOSE,) 2 MG/3ML SOPN Inject 0.5 mg into the skin once a week. 3 mL 1   sertraline (ZOLOFT) 100 MG tablet Take 1.5 tablets (150 mg total) by mouth daily. 45 tablet 6   spironolactone (ALDACTONE) 25 MG tablet Take 1 tablet (25 mg total) by mouth daily. 60 tablet 6   torsemide (DEMADEX) 20 MG tablet Take 1 tablet (20 mg total) by mouth as needed. 30 tablet 0   traMADol HCl 100 MG TABS Take 1 tablet (100 mg) by mouth every 6 (six) hours as needed for pain 90 tablet 0   traZODone (DESYREL) 100 MG tablet Take 1 tablet (100 mg total) by mouth at bedtime. 30 tablet 11   warfarin (COUMADIN) 4 MG tablet Take 2 tablet (8 mg) daily or as directed by HF clinic (Patient taking differently: Take 8 mg by mouth daily at 4 PM. Take 3 tablets (12 mg) on Tuesdays, and 2 tablet (8 mg) all other days, or as directed by HF clinic) 90 tablet 11   No current facility-administered medications for this visit.    Allergies  Allergen Reactions   Aspirin Shortness Of Breath, Itching and Rash     Burning sensation (Patient reports he tolerates other NSAIDS)    Bee Venom Hives and Swelling   Lisinopril Cough and Other (See Comments)   Tomato Hives      Review of Systems negative except from HPI and PMH  Physical Exam    BP (!) 98/0   Pulse (!) 55   Ht 5\' 7"  (1.702 m)   Wt (!) 309 lb 9.6 oz (140.4 kg)   SpO2 95%   BMI 48.49 kg/m  Well developed and Morbidly obese in no acute distress HENT normal Neck supple Clear Device pocket well healed; without  hematoma or erythema.  There is no tethering  Regular rate and rhythm, no  gallop No  / murmur but LVAD hum Abd-soft with active BS No Clubbing cyanosis edema Skin-warm and dry A & Oriented  Grossly normal sensory and motor function   Assessment and  Plan  Nonischemic cardiomyopathy  Congestive heart failure-chronic-systolic-class II  Morbid obesity  Hypertension  High Risk Medication Surveillance amiodarone   ventricular tachycardia-nonsustained  ICD-subcutaneous    SVT-sustained with T wave oversensing during aberration   The patient presents with a 6-day nontreated tachycardia.  Review of the electrograms are most consistent with SVT with aberration at the onset was associated with T wave oversensing and detection.  With loss of aberration T wave oversensing disappeared.  Rate was sufficiently low that no therapy was delivered.  Troubleshooting demonstrates that there are no sensing vectors other than ALTERNATE.  Smart pass does not improve T wave oversensing discrimination.  We will leave him at a rapid rate.  In the event that he has recurrent tachycardia, hopefully the aberration will desist prior to shock.  It should be noted that he is conditional programmed on If he does not end up getting shocked, 2 strategies present themselves 1-inactivation of high-voltage therapies with the device given the safety backup of the LVAD #2EP testing and ablation of the substrate for his tachycardia.  The SVT mechanism is not clear.  At 150 bpm it is unlikely to be atrial flutter as the amiodarone would have been expected to slow the rate considerably.  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  This might be more easily amenable to ablation  In this regard, I think is also important to rethink his amiodarone which has been on for a decade at whether it can be down titrated.  I have reviewed a half dozen of the electrograms identified as atrial fibrillation and none of them is atrial fibrillation, they  are combination of PACs and PVCs resulting in irregularity.

## 2023-12-10 ENCOUNTER — Ambulatory Visit: Payer: Medicaid Other | Admitting: Nurse Practitioner

## 2023-12-17 ENCOUNTER — Other Ambulatory Visit (HOSPITAL_COMMUNITY): Payer: Self-pay | Admitting: Unknown Physician Specialty

## 2023-12-17 DIAGNOSIS — Z95811 Presence of heart assist device: Secondary | ICD-10-CM

## 2023-12-17 DIAGNOSIS — Z7901 Long term (current) use of anticoagulants: Secondary | ICD-10-CM

## 2023-12-17 LAB — CUP PACEART INCLINIC DEVICE CHECK
Date Time Interrogation Session: 20250304091240
Implantable Lead Connection Status: 753985
Implantable Lead Implant Date: 20160812
Implantable Lead Location: 753862
Implantable Lead Model: 3401
Implantable Pulse Generator Implant Date: 20210812
Pulse Gen Serial Number: 139514

## 2023-12-21 ENCOUNTER — Encounter: Payer: Self-pay | Admitting: Internal Medicine

## 2023-12-21 ENCOUNTER — Telehealth (HOSPITAL_COMMUNITY): Payer: Self-pay | Admitting: Licensed Clinical Social Worker

## 2023-12-21 NOTE — Telephone Encounter (Signed)
 CSW contacted patient to share about the upcoming LV[facility] on Thursday March 27 at 1:30 pm in the Heart and Vascular Conference Room. Patient expressed interest. [name], LCSW, CCSW-MCS [phone]

## 2023-12-23 ENCOUNTER — Other Ambulatory Visit: Payer: Self-pay

## 2023-12-23 ENCOUNTER — Other Ambulatory Visit (HOSPITAL_COMMUNITY): Payer: Medicaid Other

## 2023-12-24 ENCOUNTER — Other Ambulatory Visit (HOSPITAL_COMMUNITY): Payer: Self-pay | Admitting: Pharmacist

## 2023-12-24 ENCOUNTER — Ambulatory Visit (HOSPITAL_COMMUNITY)
Admission: RE | Admit: 2023-12-24 | Discharge: 2023-12-24 | Disposition: A | Discharge status: Home / Self Care | Source: Ambulatory Visit | Attending: Cardiology | Admitting: Cardiology

## 2023-12-24 ENCOUNTER — Other Ambulatory Visit: Payer: Self-pay

## 2023-12-24 ENCOUNTER — Other Ambulatory Visit (HOSPITAL_COMMUNITY): Payer: Self-pay | Admitting: Internal Medicine

## 2023-12-24 ENCOUNTER — Ambulatory Visit (HOSPITAL_COMMUNITY): Payer: Self-pay | Admitting: Pharmacist

## 2023-12-24 DIAGNOSIS — Z7901 Long term (current) use of anticoagulants: Secondary | ICD-10-CM | POA: Diagnosis not present

## 2023-12-24 DIAGNOSIS — Z4509 Encounter for adjustment and management of other cardiac device: Secondary | ICD-10-CM | POA: Diagnosis not present

## 2023-12-24 DIAGNOSIS — Z95811 Presence of heart assist device: Secondary | ICD-10-CM | POA: Diagnosis not present

## 2023-12-24 LAB — PROTIME-INR
INR: 1.5 — ABNORMAL HIGH (ref 0.8–1.2)
Prothrombin Time: 17.9 s — ABNORMAL HIGH (ref 11.4–15.2)

## 2023-12-24 MED ORDER — WARFARIN SODIUM 4 MG PO TABS
ORAL_TABLET | ORAL | 11 refills | Status: DC
  Filled 2023-12-24: qty 90, 42d supply, fill #0
  Filled 2024-02-16: qty 90, 42d supply, fill #1
  Filled 2024-04-19 – 2024-04-21 (×2): qty 90, 42d supply, fill #2
  Filled 2024-05-22: qty 90, 42d supply, fill #3

## 2023-12-28 ENCOUNTER — Other Ambulatory Visit: Payer: Self-pay

## 2024-01-04 ENCOUNTER — Other Ambulatory Visit: Payer: Self-pay

## 2024-01-04 NOTE — Addendum Note (Signed)
 Addended by: Elease Etienne A on: 01/04/2024 03:34 PM   Modules accepted: Orders

## 2024-01-04 NOTE — Progress Notes (Signed)
 01/04/2024 Name: Jeffrey [name] MRN: [id number] DOB: 1976-03-09  Chief Complaint  Patient presents with   Diabetes    Jeffrey [name] is a 48 y.o. year old male who presented for a telephone visit.   They were referred to the pharmacist by their PCP for assistance in managing diabetes.    Subjective:  Care Team: Primary Care Provider: Ivonne Andrew, NP ; Next Scheduled Visit: 01/05/24 Cardiologist: Dr. Gala Romney; Next Scheduled Visit: 01/06/24  Medication Access/Adherence  Current Pharmacy:  [facility] [address], [address] [place] [address] Phone: [phone] Fax: [phone]  [id number] - [place], [place] - [address] AT [facility] [address] [place] [address] Phone: [phone] Fax: [phone]   Patient reports affordability concerns with their medications: No  Patient reports access/transportation concerns to their pharmacy: No  Patient reports adherence concerns with their medications:  No     Diabetes:  Current medications: metformin 1000 mg BID, Ozempic 0.5 mg weekly (Sundays)  Denies n/v, constipation, and diarrhea with Ozempic. Recently increased the dose to 0.5 mg on Sunday, 3/30. Has noticed he is eating smaller portions, but still 2 meals per day.  Using glucometer; testing two times daily Fasting BG: 70s-101 Before dinner: 80-90 (6 hours after lunch)  Patient denies hypoglycemic s/sx including dizziness, shakiness, sweating. Patient denies hyperglycemic symptoms including polyuria, polydipsia, polyphagia, nocturia, neuropathy, blurred vision.  Current meal patterns: 2 meals/day  - Breakfast: blueberries, raspberries, apple, eggs - Supper: meat and vegetable  - Snacks: orange, kiwi - Drinks: water only   2 meals/day - Breakfast: blueberries, raspberries, apple - Supper: meat, vegetable - Snacks: no snacks  - Drinks: water only    Current physical activity: walking every day when weather permits for about 60 minutes each time    Heart Failure (s/p LVAD): Current medications:  ACEi/ARB/ARNI: Entresto 97-103 mg BID SGLT2i: Jardiance 10 mg daily Beta blocker: none Mineralocorticoid Receptor Antagonist: spironolactone 25 mg daily Diuretic regimen: torsemide 20 mg prn Other: Bidil 20-37.5 mg - 2 tablets TID, doxazosin 8 mg BID  Current home blood pressure readings: 123/80 this AM Current home weights: 281 lbs (without equipment)  Patient denies volume overload signs or symptoms including shortness of breath, lower extremity edema, increased use of pillows at night.    Objective:  Lab Results  Component Value Date   HGBA1C 5.7 (A) 10/07/2023    Lab Results  Component Value Date   CREATININE 1.47 (H) 11/12/2023   BUN 19 11/12/2023   NA 141 11/12/2023   K 4.0 11/12/2023   CL 111 11/12/2023   CO2 19 (L) 11/12/2023    Lab Results  Component Value Date   CHOL 123 03/18/2023   HDL 46 03/18/2023   LDLCALC 53 03/18/2023   TRIG 138 03/18/2023   CHOLHDL 2.7 03/18/2023    Medications Reviewed Today     Reviewed by Vela Prose, RPH (Pharmacist) on 01/04/24 at 1659  Med List Status: <None>   Medication Order Taking? Sig Documenting Provider Last Dose Status Informant  acetaminophen (TYLENOL) 325 MG tablet [id number]  Take 2 tablets (650 mg total) by mouth every 6 (six) hours as needed for fever. Sherald Hess, NP  Active Self, Pharmacy Records           Med Note [facility]   Thu Mar 18, 2023  2:30 PM) PRN   allopurinol (ZYLOPRIM) 100 MG tablet [id number]  Take 1 tablet (100 mg total) by mouth daily. Bensimhon, Bevelyn Buckles, MD  Active   amiodarone (PACERONE) 200 MG tablet [id number]  Take 1 tablet (200 mg total) by mouth daily. Bensimhon, Bevelyn Buckles, MD  Active   amLODipine (NORVASC) 10 MG tablet [id number]  Take 1 tablet (10 mg total) by mouth daily Ivonne Andrew, NP  Active   Blood  Pressure KIT [id number]  1 Units by Does not apply route daily. Ivonne Andrew, NP  Active   cefadroxil (DURICEF) 500 MG capsule [id number]  Take 1 capsule (500 mg total) by mouth 2 (two) times daily. Bensimhon, Bevelyn Buckles, MD  Active   colchicine 0.6 MG tablet [id number]  Take 1 tablet (0.6 mg total) by mouth once daily. Bensimhon, Bevelyn Buckles, MD  Active   cyanocobalamin (VITAMIN B12) 1000 MCG tablet [id number]  Take 1 tablet (1,000 mcg total) by mouth daily. Ivonne Andrew, NP  Active   cyclobenzaprine (FLEXERIL) 5 MG tablet [id number]  Take 1 tablet (5 mg total) by mouth 3 (three) times daily as needed for muscle spasms   Active   doxazosin (CARDURA) 4 MG tablet [id number] Yes Take 2 tablets (8 mg total) by mouth 2 (two) times daily. Bensimhon, Bevelyn Buckles, MD Taking Active   empagliflozin (JARDIANCE) 10 MG TABS tablet [id number] Yes Take 1 tablet (10 mg total) by mouth daily before breakfast. Bensimhon, Bevelyn Buckles, MD Taking Active   ferrous sulfate 325 (65 FE) MG tablet [id number]  Take 1 tablet (325 mg total) by mouth daily before breakfast. Ivonne Andrew, NP  Active   folic acid (FOLVITE) 800 MCG tablet [id number]  Take 0.5 tablets (400 mcg total) by mouth daily. Ivonne Andrew, NP  Active   gabapentin (NEURONTIN) 400 MG capsule [id number]  Take 1 capsule (400 mg total) by mouth 3 (three) times daily. Bensimhon, Bevelyn Buckles, MD  Active   glucose blood test strip [id number]  Use as directed 4 times daily. Orion Crook I, NP  Active Self, Pharmacy Records  isosorbide-hydrALAZINE (BIDIL) 20-37.5 MG tablet [id number] Yes Take 2 tablets by mouth 3 (three) times daily. Bensimhon, Bevelyn Buckles, MD Taking Active   meclizine (ANTIVERT) 25 MG tablet [id number]  Take 1 tablet (25 mg total) by mouth 3 (three) times daily as needed for dizziness. Bensimhon, Bevelyn Buckles, MD  Active Self, Pharmacy Records           Med Note [place], Desma Paganini May 05, 2023  9:08 AM) More dizziness/vertigo lately  metFORMIN (GLUCOPHAGE)  1000 MG tablet [id number] Yes Take 1 tablet (1,000 mg total) by mouth 2 (two) times daily with a meal. Bensimhon, Bevelyn Buckles, MD Taking Active   ondansetron (ZOFRAN-ODT) 4 MG disintegrating tablet [id number]  Take 1 tablet (4 mg total) by mouth every 8 (eight) hours as needed for nausea or vomiting. Romie Minus, MD  Active   pantoprazole (PROTONIX) 40 MG tablet [id number]  Take 1 tablet (40 mg total) by mouth once daily. Bensimhon, Bevelyn Buckles, MD  Active   rosuvastatin (CRESTOR) 10 MG tablet [id number]  Take 1 tablet (10 mg total) by mouth daily. Bensimhon, Bevelyn Buckles, MD  Active   sacubitril-valsartan (ENTRESTO) 97-103 MG [id number] Yes Take 1 tablet by mouth 2 (two) times daily. Bensimhon, Bevelyn Buckles, MD Taking Active Self, Pharmacy Records  Semaglutide,0.25 or 0.5MG /DOS, (OZEMPIC, 0.25 OR 0.5 MG/DOSE,) 2 MG/3ML SOPN [id number] Yes Inject 0.5 mg into the skin once a week. Ivonne Andrew, NP Taking Active  sertraline (ZOLOFT) 100 MG tablet [id number]  Take 1.5 tablets (150 mg total) by mouth daily. Bensimhon, Bevelyn Buckles, MD  Active   spironolactone (ALDACTONE) 25 MG tablet [id number] Yes Take 1 tablet (25 mg total) by mouth daily. Bensimhon, Bevelyn Buckles, MD Taking Active   torsemide (DEMADEX) 20 MG tablet [id number] Yes Take 1 tablet (20 mg total) by mouth as needed. Bensimhon, Bevelyn Buckles, MD Taking Active   traMADol HCl 100 MG TABS [id number]  Take 1 tablet (100 mg) by mouth every 6 (six) hours as needed for pain Bensimhon, Bevelyn Buckles, MD  Active   traZODone (DESYREL) 100 MG tablet [id number]  Take 1 tablet (100 mg total) by mouth at bedtime. Bensimhon, Bevelyn Buckles, MD  Active   warfarin (COUMADIN) 4 MG tablet [id number]  Take 12 mg (3 tabs) every Tuesday and 8 mg (2 tabs) all other days or as directed by HF clinic Bensimhon, Bevelyn Buckles, MD  Active               Assessment/Plan:   Diabetes: - Currently controlled with last A1c 5.7% on 10/07/23 and patient reported home BG readings are at goal. Patient is tolerating  Ozempic well so far. Goal to titrate dose for max weight loss benefit as tolerated. With next dose increase of Ozempic, likely can decrease the dose or discontinue metformin. Monitoring renal function, last eGFR 58. - Reviewed long term cardiovascular and renal outcomes of uncontrolled blood sugar - Reviewed goal A1c, goal fasting, and goal 2 hour post prandial glucose - Reviewed dietary modifications including limiting portion sizes - Reviewed lifestyle modifications including: commended him for walking 60 minutes almost every day and encouraged him to keep this up! - Recommend to continue Ozempic 0.5 mg weekly - Recommend to continue metformin 1000 mg BID  - Patient denies personal or family history of multiple endocrine neoplasia type 2, medullary thyroid cancer; personal history of pancreatitis or gallbladder disease. - Recommend to check fasting blood glucose daily    Heart Failure: - Currently managed by [facility] - Reviewed appropriate blood pressure monitoring technique and reviewed goal blood pressure - Reviewed to weigh daily and when to contact cardiology with weight gain - Recommend to continue current regimen    Follow Up Plan: PCP 01/05/24, Cardiology 01/06/24, PharmD 01/18/24   Jarrett Ables, PharmD PGY-1 Pharmacy Resident

## 2024-01-04 NOTE — Progress Notes (Signed)
 Remote ICD transmission.

## 2024-01-05 ENCOUNTER — Encounter: Payer: Self-pay | Admitting: Nurse Practitioner

## 2024-01-05 ENCOUNTER — Other Ambulatory Visit (HOSPITAL_COMMUNITY): Payer: Self-pay | Admitting: Unknown Physician Specialty

## 2024-01-05 ENCOUNTER — Ambulatory Visit: Payer: Self-pay | Admitting: Nurse Practitioner

## 2024-01-05 VITALS — BP 104/68 | HR 86 | Temp 98.2°F | Wt 305.8 lb

## 2024-01-05 DIAGNOSIS — Z794 Long term (current) use of insulin: Secondary | ICD-10-CM | POA: Diagnosis not present

## 2024-01-05 DIAGNOSIS — E1122 Type 2 diabetes mellitus with diabetic chronic kidney disease: Secondary | ICD-10-CM

## 2024-01-05 DIAGNOSIS — N1831 Chronic kidney disease, stage 3a: Secondary | ICD-10-CM | POA: Diagnosis not present

## 2024-01-05 DIAGNOSIS — Z95811 Presence of heart assist device: Secondary | ICD-10-CM

## 2024-01-05 DIAGNOSIS — Z7901 Long term (current) use of anticoagulants: Secondary | ICD-10-CM

## 2024-01-05 LAB — POCT GLYCOSYLATED HEMOGLOBIN (HGB A1C): Hemoglobin A1C: 5.5 % (ref 4.0–5.6)

## 2024-01-05 NOTE — Progress Notes (Signed)
 Subjective   Patient ID: Jeffrey [name], male    DOB: 1975-10-13, 48 y.o.   MRN: [id number]  Chief Complaint  Patient presents with   Diabetes    Patient stated that his numbers have been great     Referring provider: Ivonne Andrew, NP  [name] is a 48 y.o. male with Past Medical History: No date: Anxiety No date: Aspirin allergy No date: Childhood asthma No date: Chronic systolic CHF (congestive heart failure) (HCC)     Comment:  a. EF 20-25% in 2012. b. EF 45-50% in 10/2011 with               nonischemic nuc - presumed NICM. c. 12/2014 Echo: Sev               depressed LV fxn, sev dil LV, mild LVH, mild MR, sev dil               LA, mildly reduced RV fxn. No date: CKD (chronic kidney disease) stage 2, GFR 60-89 ml/min 12/2019: H/O vasectomy No date: High cholesterol No date: Hypertension No date: Morbid obesity (HCC) No date: Nephrolithiasis No date: OSA on CPAP No date: Paroxysmal atrial fibrillation (HCC) No date: Presumed NICM     Comment:  a. 04/2014 Myoview: EF 26%, glob HK, sev glob HK, ? prior              infarct;  b. Never cathed 2/2 CKD. No date: Renal cell carcinoma (HCC)     Comment:  a. s/p Rt robotic assisted partial converted to radical               nephrectomy on 01/2013. No date: Troponin level elevated     Comment:  a. 04/2014, 12/2014: felt due to CHF. No date: Type II diabetes mellitus (HCC) No date: Ventricular tachycardia (HCC)     Comment:  a. appropriate ICD therapy 12/2017   HPI   Diabetes Mellitus:    Patient presents for follow up of diabetes. Symptoms: none. Denies any symptoms. Patient denies foot ulcerations, hypoglycemia , and nausea.  Evaluation to date has been included: hemoglobin A1C.  Home sugars: patient does not check sugars. Treatment to date: no recent interventions. A1C is 5.5. Is requesting to start on ozempic for weight loss - will refer to pharmacy for medication assistance.      Hypertension:    Patient here for  follow-up of elevated blood pressure. Patient is trying to loose weight for possible heart transplant. Cardiac symptoms none. Patient denies claudication, dyspnea, fatigue, and near-syncope.  Cardiovascular risk factors: hypertension, male gender, and obesity (BMI >= 30 kg/m2). Use of agents associated with hypertension: none.  Blood pressures have been low recently.  We will decrease dosage of amlodipine and consult with pharmacy.  We have sent a message to cardiology as well.  Patient did have a low hemoglobin at recent lab checks through cardiology.  He was advised to start iron supplements.  He states that he has not done this.  We will start him on vitamin B12 which was low also, folic acid, iron supplement. Was previously seen by nephrology - has not followed up - will place referral back to nephrology.      Denies f/c/s, n/v/d, hemoptysis, PND, leg swelling. Denies chest pain or edema.     Allergies  Allergen Reactions   Aspirin Shortness Of Breath, Itching and Rash     Burning sensation (Patient reports he tolerates other NSAIDS)  Bee Venom Hives and Swelling   Lisinopril Cough and Other (See Comments)   Tomato Hives    Immunization History  Administered Date(s) Administered   Influenza Split 10/24/2011   Influenza,inj,Quad PF,6+ Mos 07/23/2017, 09/06/2018, 09/18/2019   Pneumococcal Conjugate-13 11/01/2017   Pneumococcal Polysaccharide-23 10/24/2011   Tdap 04/07/2015    Tobacco History: Social History   Tobacco Use  Smoking Status Former   Current packs/day: 0.00   Average packs/day: 0.3 packs/day for 22.0 years (5.5 ttl pk-yrs)   Types: Cigarettes   Start date: 04/29/1993   Quit date: 04/30/2015   Years since quitting: 8.6  Smokeless Tobacco Never   Counseling given: Not Answered   Outpatient Encounter Medications as of 01/05/2024  Medication Sig   acetaminophen (TYLENOL) 325 MG tablet Take 2 tablets (650 mg total) by mouth every 6 (six) hours as needed for fever.    allopurinol (ZYLOPRIM) 100 MG tablet Take 1 tablet (100 mg total) by mouth daily.   amiodarone (PACERONE) 200 MG tablet Take 1 tablet (200 mg total) by mouth daily.   amLODipine (NORVASC) 10 MG tablet Take 1 tablet (10 mg total) by mouth daily   Blood Pressure KIT 1 Units by Does not apply route daily.   cefadroxil (DURICEF) 500 MG capsule Take 1 capsule (500 mg total) by mouth 2 (two) times daily.   colchicine 0.6 MG tablet Take 1 tablet (0.6 mg total) by mouth once daily.   cyanocobalamin (VITAMIN B12) 1000 MCG tablet Take 1 tablet (1,000 mcg total) by mouth daily.   cyclobenzaprine (FLEXERIL) 5 MG tablet Take 1 tablet (5 mg total) by mouth 3 (three) times daily as needed for muscle spasms   doxazosin (CARDURA) 4 MG tablet Take 2 tablets (8 mg total) by mouth 2 (two) times daily.   empagliflozin (JARDIANCE) 10 MG TABS tablet Take 1 tablet (10 mg total) by mouth daily before breakfast.   ferrous sulfate 325 (65 FE) MG tablet Take 1 tablet (325 mg total) by mouth daily before breakfast.   folic acid (FOLVITE) 800 MCG tablet Take 0.5 tablets (400 mcg total) by mouth daily.   gabapentin (NEURONTIN) 400 MG capsule Take 1 capsule (400 mg total) by mouth 3 (three) times daily.   glucose blood test strip Use as directed 4 times daily.   isosorbide-hydrALAZINE (BIDIL) 20-37.5 MG tablet Take 2 tablets by mouth 3 (three) times daily.   meclizine (ANTIVERT) 25 MG tablet Take 1 tablet (25 mg total) by mouth 3 (three) times daily as needed for dizziness.   metFORMIN (GLUCOPHAGE) 1000 MG tablet Take 1 tablet (1,000 mg total) by mouth 2 (two) times daily with a meal.   ondansetron (ZOFRAN-ODT) 4 MG disintegrating tablet Take 1 tablet (4 mg total) by mouth every 8 (eight) hours as needed for nausea or vomiting.   pantoprazole (PROTONIX) 40 MG tablet Take 1 tablet (40 mg total) by mouth once daily.   rosuvastatin (CRESTOR) 10 MG tablet Take 1 tablet (10 mg total) by mouth daily.   sacubitril-valsartan (ENTRESTO)  97-103 MG Take 1 tablet by mouth 2 (two) times daily.   Semaglutide,0.25 or 0.5MG /DOS, (OZEMPIC, 0.25 OR 0.5 MG/DOSE,) 2 MG/3ML SOPN Inject 0.5 mg into the skin once a week.   sertraline (ZOLOFT) 100 MG tablet Take 1.5 tablets (150 mg total) by mouth daily.   spironolactone (ALDACTONE) 25 MG tablet Take 1 tablet (25 mg total) by mouth daily.   torsemide (DEMADEX) 20 MG tablet Take 1 tablet (20 mg total) by mouth as needed.  traMADol HCl 100 MG TABS Take 1 tablet (100 mg) by mouth every 6 (six) hours as needed for pain   traZODone (DESYREL) 100 MG tablet Take 1 tablet (100 mg total) by mouth at bedtime.   warfarin (COUMADIN) 4 MG tablet Take 12 mg (3 tabs) every Tuesday and 8 mg (2 tabs) all other days or as directed by HF clinic   No facility-administered encounter medications on file as of 01/05/2024.    Review of Systems  Review of Systems  Constitutional: Negative.   HENT: Negative.    Respiratory:  Negative for cough.   Cardiovascular: Negative.   Gastrointestinal: Negative.   Allergic/Immunologic: Negative.   Neurological: Negative.   Psychiatric/Behavioral: Negative.       Objective:   BP 104/68   Pulse 86   Temp 98.2 F (36.8 C) (Oral)   Wt (!) 305 lb 12.8 oz (138.7 kg)   SpO2 100%   BMI 47.90 kg/m   Wt Readings from Last 5 Encounters:  01/05/24 (!) 305 lb 12.8 oz (138.7 kg)  12/07/23 (!) 309 lb 9.6 oz (140.4 kg)  11/23/23 289 lb (131.1 kg)  11/12/23 (!) 308 lb 6.4 oz (139.9 kg)  10/07/23 (!) 308 lb 12.8 oz (140.1 kg)     Physical Exam Vitals and nursing note reviewed.  Constitutional:      General: He is not in acute distress.    Appearance: He is well-developed.  Cardiovascular:     Rate and Rhythm: Normal rate and regular rhythm.  Pulmonary:     Effort: Pulmonary effort is normal.     Breath sounds: Normal breath sounds.  Skin:    General: Skin is warm and dry.  Neurological:     Mental Status: He is alert and oriented to person, place, and time.        Assessment & Plan:   Type 2 diabetes mellitus with stage 3a chronic kidney disease, with long-term current use of insulin (HCC) -     POCT glycosylated hemoglobin (Hb A1C) -     Ambulatory referral to Ophthalmology     Return in about 3 months (around 04/05/2024).     Ivonne Andrew, NP 01/05/2024

## 2024-01-05 NOTE — Patient Instructions (Addendum)
 1. Type 2 diabetes mellitus with stage 3a chronic kidney disease, with long-term current use of insulin (HCC) (Primary)  - POCT glycosylated hemoglobin (Hb A1C)   Follow up:  Follow up in 3 months  [facility] (accepts Medicaid, Medicare, [employer], and/or Self-Pay) [facility]  [address] [place],  [place], [place] [address] [phone]  [facility]  [address] [place], [place] [address] [phone]  [facility]  [address] [facility] [place] [address] [place], [place] [address] *Located next to [facility]  [phone]  [facility]  [address] [place], [place] [address]  *Located next to [employer] [phone]  [facility]  [address] [place], [place] [address] [phone]  [facility]  [address] [place], [place] [address]  [phone] *Located inside [facility]  [address] [place], [place] [address] [phone]  [facility] [address] [place], [place] [address] [phone] [address] [place], [place] [address] [phone]  [facility] [address] [place], [place] [address] [phone]  [facility]  [address] [place], [place] [address] [phone]  [facility]  [address] [place], [place] [address] [phone]

## 2024-01-06 ENCOUNTER — Ambulatory Visit (HOSPITAL_COMMUNITY): Payer: Self-pay | Admitting: Pharmacist

## 2024-01-06 ENCOUNTER — Ambulatory Visit (HOSPITAL_COMMUNITY)
Admission: RE | Admit: 2024-01-06 | Discharge: 2024-01-06 | Disposition: A | Discharge status: Home / Self Care | Payer: Medicaid Other | Source: Ambulatory Visit | Attending: Internal Medicine | Admitting: Internal Medicine

## 2024-01-06 VITALS — BP 92/0 | HR 84 | Ht 67.0 in | Wt 307.8 lb

## 2024-01-06 DIAGNOSIS — M109 Gout, unspecified: Secondary | ICD-10-CM | POA: Diagnosis not present

## 2024-01-06 DIAGNOSIS — F419 Anxiety disorder, unspecified: Secondary | ICD-10-CM | POA: Insufficient documentation

## 2024-01-06 DIAGNOSIS — Z79899 Other long term (current) drug therapy: Secondary | ICD-10-CM | POA: Diagnosis not present

## 2024-01-06 DIAGNOSIS — Z6841 Body Mass Index (BMI) 40.0 and over, adult: Secondary | ICD-10-CM | POA: Diagnosis not present

## 2024-01-06 DIAGNOSIS — G4733 Obstructive sleep apnea (adult) (pediatric): Secondary | ICD-10-CM | POA: Insufficient documentation

## 2024-01-06 DIAGNOSIS — I5022 Chronic systolic (congestive) heart failure: Secondary | ICD-10-CM | POA: Insufficient documentation

## 2024-01-06 DIAGNOSIS — Z85528 Personal history of other malignant neoplasm of kidney: Secondary | ICD-10-CM | POA: Diagnosis not present

## 2024-01-06 DIAGNOSIS — I130 Hypertensive heart and chronic kidney disease with heart failure and stage 1 through stage 4 chronic kidney disease, or unspecified chronic kidney disease: Secondary | ICD-10-CM | POA: Insufficient documentation

## 2024-01-06 DIAGNOSIS — I480 Paroxysmal atrial fibrillation: Secondary | ICD-10-CM

## 2024-01-06 DIAGNOSIS — Z905 Acquired absence of kidney: Secondary | ICD-10-CM | POA: Diagnosis not present

## 2024-01-06 DIAGNOSIS — N1832 Chronic kidney disease, stage 3b: Secondary | ICD-10-CM | POA: Diagnosis not present

## 2024-01-06 DIAGNOSIS — I428 Other cardiomyopathies: Secondary | ICD-10-CM | POA: Insufficient documentation

## 2024-01-06 DIAGNOSIS — Z7901 Long term (current) use of anticoagulants: Secondary | ICD-10-CM | POA: Insufficient documentation

## 2024-01-06 DIAGNOSIS — I472 Ventricular tachycardia, unspecified: Secondary | ICD-10-CM | POA: Diagnosis not present

## 2024-01-06 DIAGNOSIS — F32A Depression, unspecified: Secondary | ICD-10-CM | POA: Diagnosis not present

## 2024-01-06 DIAGNOSIS — Z7985 Long-term (current) use of injectable non-insulin antidiabetic drugs: Secondary | ICD-10-CM | POA: Diagnosis not present

## 2024-01-06 DIAGNOSIS — Z95811 Presence of heart assist device: Secondary | ICD-10-CM | POA: Diagnosis not present

## 2024-01-06 LAB — COMPREHENSIVE METABOLIC PANEL WITH GFR
ALT: 21 U/L (ref 0–44)
AST: 26 U/L (ref 15–41)
Albumin: 3.5 g/dL (ref 3.5–5.0)
Alkaline Phosphatase: 56 U/L (ref 38–126)
Anion gap: 9 (ref 5–15)
BUN: 29 mg/dL — ABNORMAL HIGH (ref 6–20)
CO2: 20 mmol/L — ABNORMAL LOW (ref 22–32)
Calcium: 9.8 mg/dL (ref 8.9–10.3)
Chloride: 109 mmol/L (ref 98–111)
Creatinine, Ser: 1.94 mg/dL — ABNORMAL HIGH (ref 0.61–1.24)
GFR, Estimated: 42 mL/min — ABNORMAL LOW (ref >60)
Glucose, Bld: 134 mg/dL — ABNORMAL HIGH (ref 70–99)
Potassium: 4.3 mmol/L (ref 3.5–5.1)
Sodium: 138 mmol/L (ref 135–145)
Total Bilirubin: 0.8 mg/dL (ref 0.0–1.2)
Total Protein: 6.6 g/dL (ref 6.5–8.1)

## 2024-01-06 LAB — CBC
HCT: 44.9 % (ref 39.0–52.0)
Hemoglobin: 14.5 g/dL (ref 13.0–17.0)
MCH: 26.7 pg (ref 26.0–34.0)
MCHC: 32.3 g/dL (ref 30.0–36.0)
MCV: 82.5 fL (ref 80.0–100.0)
Platelets: 197 10*3/uL (ref 150–400)
RBC: 5.44 MIL/uL (ref 4.22–5.81)
RDW: 18 % — ABNORMAL HIGH (ref 11.5–15.5)
WBC: 6.1 10*3/uL (ref 4.0–10.5)
nRBC: 0 % (ref 0.0–0.2)

## 2024-01-06 LAB — PROTIME-INR
INR: 1.6 — ABNORMAL HIGH (ref 0.8–1.2)
Prothrombin Time: 19.3 s — ABNORMAL HIGH (ref 11.4–15.2)

## 2024-01-06 LAB — TSH: TSH: 3.108 u[IU]/mL (ref 0.350–4.500)

## 2024-01-06 LAB — T4, FREE: Free T4: 0.93 ng/dL (ref 0.61–1.12)

## 2024-01-06 LAB — LACTATE DEHYDROGENASE: LDH: 216 U/L — ABNORMAL HIGH (ref 98–192)

## 2024-01-06 LAB — PREALBUMIN: Prealbumin: 29 mg/dL (ref 18–38)

## 2024-01-06 NOTE — Patient Instructions (Signed)
 No change in medications  Return to clinic in 2 months

## 2024-01-06 NOTE — Progress Notes (Signed)
 Patient presents to VAD Clinic today for 2 month w/2.5 yr Intermacs follow up alone. Reports no problem with VAD or equipment.   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   Pt has no complaints today. BP is much better. Pt is asking about lifting weights. Dr Gala Romney informed him he is ok to lift just not to lift the max.   Pts CR is 1.94 today. Pt has not taken any Torsemide since his last visit. He tells me that he tries to stay hydrated. We will recheck his BMET in 2 wks.  Vital Signs:  Doppler Pressure: 92 Automatic BP: 81/72 (76) HR: 84 SPO2: 94% on RA   Weight: 307.8 bs lb w/ eqt Last weight:  308.4  lbs w/eqt   VAD Indication: Destination Therapy due to smoking and BMI   VAD interrogation & Equipment Management: Speed: 6200 Flow: 5.3 Power: 5.2w    PI: 2.8 Hct: 39   Alarms: none Events: 5-10  Fixed speed 6200 Low speed limit: 5900   Primary Controller: Replace back up battery in 25 mths Back up controller:  pt did not bring to clinic-instructed to bring next visit   Annual Equipment Maintenance on UBC/PM was performed on 06/17/23.    I reviewed the LVAD parameters from today and compared the results to the patient's prior recorded data. LVAD interrogation was NEGATIVE for significant power changes, NEGATIVE for clinical alarms and STABLE for PI events/speed drops. No programming changes were made and pump is functioning within specified parameters. Pt is performing daily controller and system monitor self tests along with completing weekly and monthly maintenance for LVAD equipment.   LVAD equipment check completed and is in good working order. Back-up equipment not present.    Exit Site Care: Drive line being maintained weekly by pt's wife. Existing dressing CDI. Provided patient with 8 weekly kits and 8 anchors and 2 large boxes of tegaderm for home use.   Device:  Left subcutaneous dual ICD Therapies: on at 250  BPM Last check: 09/01/23  BP & Labs:  Doppler 92 - Doppler is reflecting modified systolic   Hgb 14.5  - No S/S of bleeding. Specifically denies melena/BRBPR or nosebleeds.   LDH 216 with established baseline of 160 - 300. Denies tea-colored urine. No power elevations noted on interrogation.   2.5 year Intermacs follow up completed including:  Quality of Life, KCCQ-12, and Neurocognitive trail making.   Pt completed 1040 feet during 6 minute walk.  Back up controller:  11V backup battery charged during this visit.  Patient Goals: Lose weight with [facility] Cardiomyopathy Questionnaire     01/06/2024   12:44 PM 06/17/2023    3:08 PM 02/09/2023    2:38 PM  KCCQ-12  1 a. Ability to shower/bathe Moderately limited Slightly limited Slightly limited  1 b. Ability to walk 1 block Moderately limited Slightly limited Slightly limited  1 c. Ability to hurry/jog Quite a bit limited Quite a bit limited Extremely limited  2. Edema feet/ankles/legs 1-2 times a week Never over the past 2 weeks Less than once a week  3. Limited by fatigue 3+ times per week, not every day 1-2 times a week At least once a day  4. Limited by dyspnea 3+ times a week, not every day 1-2 times a week At least once a day  5. Sitting up / on 3+ pillows Less than once a week Never over the past 2 weeks  Never over the past 2 weeks  6. Limited enjoyment of life Limited quite a bit Slightly limited Slightly limited  7. Rest of life w/ symptoms Mostly satisfied Completely satisfied Mostly satisfied  8 a. Participation in hobbies Slightly limited Slightly limited Limited quite a bit  8 b. Participation in chores Did not limit at all Moderately limited Limited quite a bit  8 c. Visiting family/friends Did not limit at all Slightly limited Limited quite a bit      Plan:  Please stay hydrated. Return for BMET and INR in 2 weeks Return to VAD Clinic in 2 months   Carlton Adam RN,BSN VAD Coordinator  Office:  [phone]  24/7 Pager: [phone]

## 2024-01-06 NOTE — Progress Notes (Signed)
 VAD CLINIC Follow-up Visit  PCP: Ivonne Andrew, NP HF MD: DB   HPI:  Jeffrey [name] is a 48 y.o. yo male with a history of  poorly controlled HTN, R renal cell carcinoma s/p nephrectomy, CKD IIIa, DM2, OSA, gout, morbid obesity and systolic HF due to NICM. 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 D/c'd home with plan for 6 weeks IV Ancef with plan to transition to cefadroxil for long-term suppression on 09/28/22.   Here for f/u. Doing great. No CP or SOB. Remains on cefadroxil for h/o DL infection. Says site is healed. Denies orthopnea or PND. No fevers, chills or problems with driveline. No bleeding, melena or neuro symptoms. No VAD alarms. Taking all meds as prescribed.     VAD Indication: Destination Therapy due to smoking and BMI   VAD interrogation & Equipment Management: Speed: 6200 Flow: 5.3 Power: 5.2w    PI: 2.8 Hct: 39   Alarms: none Events: 5-10  Fixed speed 6200 Low speed limit: 5900   Primary Controller: Replace back up battery in 25 months Back up controller:  pt did not bring to clinic-instructed to bring next visit   Annual Equipment Maintenance on UBC/PM was performed on 06/17/23.    I reviewed the LVAD parameters from today and compared the results to the patient's prior recorded data. LVAD interrogation was NEGATIVE for significant power changes, NEGATIVE for clinical alarms and STABLE for PI events/speed drops. No programming changes were made and pump is functioning within specified parameters. Pt is performing daily controller and system monitor self tests along with completing weekly and monthly maintenance for LVAD equipment.   LVAD equipment check completed and is in good working order. Back-up equipment not present.        Past Medical History:  Diagnosis Date   Anxiety    Aspirin allergy    Childhood asthma    Chronic systolic CHF (congestive heart  failure) (HCC)    a. EF 20-25% in 2012. b. EF 45-50% in 10/2011 with nonischemic nuc - presumed NICM. c. 12/2014 Echo: Sev depressed LV fxn, sev dil LV, mild LVH, mild MR, sev dil LA, mildly reduced RV fxn.   CKD (chronic kidney disease) stage 2, GFR 60-89 ml/min    H/O vasectomy 12/2019   High cholesterol    Hypertension    Morbid obesity (HCC)    Nephrolithiasis    OSA on CPAP    Paroxysmal atrial fibrillation (HCC)    Presumed NICM    a. 04/2014 Myoview: EF 26%, glob HK, sev glob HK, ? prior infarct;  b. Never cathed 2/2 CKD.   Renal cell carcinoma (HCC)    a. s/p Rt robotic assisted partial converted to radical nephrectomy on 01/2013.   Troponin level elevated    a. 04/2014, 12/2014: felt due to CHF.   Type II diabetes mellitus (HCC)    Ventricular tachycardia (HCC)    a. appropriate ICD therapy 12/2017    Current Outpatient Medications  Medication Sig Dispense Refill   acetaminophen (TYLENOL) 325 MG tablet Take 2 tablets (650 mg total) by mouth every 6 (six) hours as needed for fever.     allopurinol (ZYLOPRIM) 100 MG tablet Take 1 tablet (100 mg total) by mouth daily. 90 tablet 3   amiodarone (PACERONE) 200 MG tablet Take 1 tablet (200 mg total) by mouth daily. 30 tablet 11   amLODipine (NORVASC)  10 MG tablet Take 1 tablet (10 mg total) by mouth daily 30 tablet 6   Blood Pressure KIT 1 Units by Does not apply route daily. 1 kit 0   cefadroxil (DURICEF) 500 MG capsule Take 1 capsule (500 mg total) by mouth 2 (two) times daily. 180 capsule 3   colchicine 0.6 MG tablet Take 1 tablet (0.6 mg total) by mouth once daily. 30 tablet 6   cyanocobalamin (VITAMIN B12) 1000 MCG tablet Take 1 tablet (1,000 mcg total) by mouth daily. 30 tablet 0   cyclobenzaprine (FLEXERIL) 5 MG tablet Take 1 tablet (5 mg total) by mouth 3 (three) times daily as needed for muscle spasms 15 tablet 0   doxazosin (CARDURA) 4 MG tablet Take 2 tablets (8 mg total) by mouth 2 (two) times daily. 120 tablet 6    empagliflozin (JARDIANCE) 10 MG TABS tablet Take 1 tablet (10 mg total) by mouth daily before breakfast. 30 tablet 11   ferrous sulfate 325 (65 FE) MG tablet Take 1 tablet (325 mg total) by mouth daily before breakfast. 90 tablet 1   folic acid (FOLVITE) 800 MCG tablet Take 0.5 tablets (400 mcg total) by mouth daily. 30 tablet 2   gabapentin (NEURONTIN) 400 MG capsule Take 1 capsule (400 mg total) by mouth 3 (three) times daily. 30 capsule 6   glucose blood test strip Use as directed 4 times daily. 100 each 11   isosorbide-hydrALAZINE (BIDIL) 20-37.5 MG tablet Take 2 tablets by mouth 3 (three) times daily. 180 tablet 6   meclizine (ANTIVERT) 25 MG tablet Take 1 tablet (25 mg total) by mouth 3 (three) times daily as needed for dizziness. 90 tablet 2   metFORMIN (GLUCOPHAGE) 1000 MG tablet Take 1 tablet (1,000 mg total) by mouth 2 (two) times daily with a meal. 60 tablet 11   ondansetron (ZOFRAN-ODT) 4 MG disintegrating tablet Take 1 tablet (4 mg total) by mouth every 8 (eight) hours as needed for nausea or vomiting. 5 tablet 0   pantoprazole (PROTONIX) 40 MG tablet Take 1 tablet (40 mg total) by mouth once daily. 90 tablet 3   rosuvastatin (CRESTOR) 10 MG tablet Take 1 tablet (10 mg total) by mouth daily. 90 tablet 3   sacubitril-valsartan (ENTRESTO) 97-103 MG Take 1 tablet by mouth 2 (two) times daily. 60 tablet 6   Semaglutide,0.25 or 0.5MG /DOS, (OZEMPIC, 0.25 OR 0.5 MG/DOSE,) 2 MG/3ML SOPN Inject 0.5 mg into the skin once a week. 3 mL 1   sertraline (ZOLOFT) 100 MG tablet Take 1.5 tablets (150 mg total) by mouth daily. 45 tablet 6   spironolactone (ALDACTONE) 25 MG tablet Take 1 tablet (25 mg total) by mouth daily. 60 tablet 6   torsemide (DEMADEX) 20 MG tablet Take 1 tablet (20 mg total) by mouth as needed. 30 tablet 0   traMADol HCl 100 MG TABS Take 1 tablet (100 mg) by mouth every 6 (six) hours as needed for pain 90 tablet 0   traZODone (DESYREL) 100 MG tablet Take 1 tablet (100 mg total) by  mouth at bedtime. 30 tablet 11   warfarin (COUMADIN) 4 MG tablet Take 12 mg (3 tabs) every Tuesday and 8 mg (2 tabs) all other days or as directed by HF clinic 90 tablet 11   No current facility-administered medications for this encounter.    Aspirin, Bee venom, Lisinopril, and Tomato  REVIEW OF SYSTEMS: All systems negative except as listed in HPI, PMH and Problem list.   Vitals:   01/06/24  1223 01/06/24 1224  BP: (!) 81/72 (!) 92/0  Pulse: 84   SpO2: 94%   Weight: (!) 139.6 kg (307 lb 12.8 oz)   Height: 5\' 7"  (1.702 m)      Vital Signs:  Doppler Pressure: 92 Automatic BP: 81/72 (76) HR: 84 SPO2: 94% on RA   Weight: 307.8 bs lb w/ eqt Last weight:  308.4  lbs w/eqt    Exam: General:  NAD.  HEENT: normal  Neck: supple. JVP not elevated.  Carotids 2+ bilat; no bruits. No lymphadenopathy or thryomegaly appreciated. Cor: LVAD hum.  Lungs: Clear. Abdomen: obese soft, nontender, non-distended. No hepatosplenomegaly. No bruits or masses. Good bowel sounds. Driveline site clean. Anchor in place.  Extremities: no cyanosis, clubbing, rash. Warm no edema  Neuro: alert & oriented x 3. No focal deficits. Moves all 4 without problem    ASSESSMENT AND PLAN:  .1. Chronic systolic CHF - Nonischemic CM, end-stage - EF 2012; 20-25%  - Echo  8/22: EF < 20%, severe LV dilation, moderate RV dysfunction.   - RHC (8/22): well compensated hemodynamics. - s/p S-ICD - S/p HM3 LVAD 10/17 (DT) - Dong great NYHA I  - Volume status looks good  - Continue Bidil 2 tabs tid - Continue spironolactone 25 mg daily  - Continue entresto to 97/130 bid - Aware of need to get to BMI ~35 to permit transplant  Body mass index is 48.21 kg/m. Now back on OZempic. Weight unchanged  2. VAD  - s/p HM-3 placement 07/21/21  -VAD interrogated personally. Parameters stable.. - INR 1.6 Goal INR 2.0-3.0 Discussed warfarin dosing with PharmD personally. - LDH 216 - DL site looks good  Continue cefadroxil  for suppression - Hgb 14.5  3. DL site infection - Resolved - Continue cefadroxil for suppression  4 Atrial fibrillation: Paroxysmal - Had severe AF with RVR 7/22 s/p DC-CV x 2 - Seen by EP. Not a candidate for ablation due to body habitus  (could consider AVN ablation and CRT, if needed)  - Had post-op AF with RVR post VAD - 10/23 S/P DC-CV with restoration of NSR. - Remains in NSR. Continue amio 100 daily - On warfarin  5. HTN - MAPs improved  6. CKD stage 3B  - Has solitary kidney.   - Baseline creatinine is 1.5-1.8.  - Creatinine 1.94 - will recheck next week    7. OSA - Continue CPAP   8. DM2  - On Jardiance. - Hgb A1c 5.5 on 01/05/24 Personally reviewed - PCP following - Continue Crestor 10  9. Polyarticular gout. - quiescent  10. Depression/anxiety - Continue Zoloft 150mg  daily - Much improved  11. Morbid obesity - Body mass index is 48.21 kg/m. - Continue Ozempic.  - Aware of need for weight loss as above   I spent a total of 41 minutes today: 1) reviewing the patient's medical records including previous charts, labs and recent notes from other providers; 2) examining the patient and counseling them on their medical issues/explaining the plan of care; 3) adjusting meds as needed and 4) ordering lab work or other needed tests.   Arvilla Meres, MD  5:29 PM

## 2024-01-07 LAB — T3, FREE: T3, Free: 2.9 pg/mL (ref 2.0–4.4)

## 2024-01-10 ENCOUNTER — Ambulatory Visit: Payer: Self-pay | Admitting: Obstetrics and Gynecology

## 2024-01-11 ENCOUNTER — Other Ambulatory Visit: Payer: Self-pay

## 2024-01-11 NOTE — Patient Outreach (Signed)
 Complex Care Management   Visit Note  01/11/2024  Name:  Jeffrey [name] MRN: [id number] DOB: April 09, 1976  Situation: Referral received for Complex Care Management related to Heart Failure and Obesity  I obtained verbal consent from patient.  Visit completed with patient  on the phone  Background:   Past Medical History:  Diagnosis Date   Anxiety    Aspirin allergy    Childhood asthma    Chronic systolic CHF (congestive heart failure) (HCC)    a. EF 20-25% in 2012. b. EF 45-50% in 10/2011 with nonischemic nuc - presumed NICM. c. 12/2014 Echo: Sev depressed LV fxn, sev dil LV, mild LVH, mild MR, sev dil LA, mildly reduced RV fxn.   CKD (chronic kidney disease) stage 2, GFR 60-89 ml/min    H/O vasectomy 12/2019   High cholesterol    Hypertension    Morbid obesity (HCC)    Nephrolithiasis    OSA on CPAP    Paroxysmal atrial fibrillation (HCC)    Presumed NICM    a. 04/2014 Myoview: EF 26%, glob HK, sev glob HK, ? prior infarct;  b. Never cathed 2/2 CKD.   Renal cell carcinoma (HCC)    a. s/p Rt robotic assisted partial converted to radical nephrectomy on 01/2013.   Troponin level elevated    a. 04/2014, 12/2014: felt due to CHF.   Type II diabetes mellitus (HCC)    Ventricular tachycardia (HCC)    a. appropriate ICD therapy 12/2017    Assessment: Patient Reported Symptoms:  Cognitive Alert and oriented to person, place, and time, Normal speech and language skills  Neurological No symptoms reported    HEENT No symptoms reported, Other: (Needs to make an eye appt for glasses, reviewed list given to him by Tonya Nichols/NP.  He plans on calling in the next few days for appt.)    Cardiovascular No symptoms reported (Patient has LVAD device, sees Dr. [name] as ordered (last appt was April 2025), takes Warfarin with frequent PT/INR checks and adjusts dosing as ordered.)    Respiratory No symptoms reported    Endocrine No symptoms reported    Gastrointestinal No symptoms reported     Genitourinary No symptoms reported    Integumentary No symptoms reported    Musculoskeletal No symptoms reported    Psychosocial No symptoms reported (PHQ9=2, denies any difficulty managing depression, takes Sertraline as ordered.)     There were no vitals filed for this visit.  Medications Reviewed Today     Reviewed by Sherrye Payor, RN (Registered Nurse) on 01/11/24 at [phone]  Med List Status: <None>   Medication Order Taking? Sig Documenting Provider Last Dose Status Informant  acetaminophen (TYLENOL) 325 MG tablet [id number]  Take 2 tablets (650 mg total) by mouth every 6 (six) hours as needed for fever. Sherald Hess, NP  Active Self, Pharmacy Records           Med Note [facility], [facility]   Thu Mar 18, 2023  2:30 PM) PRN   allopurinol (ZYLOPRIM) 100 MG tablet [id number] Yes Take 1 tablet (100 mg total) by mouth daily. Bensimhon, Bevelyn Buckles, MD Taking Active   amiodarone (PACERONE) 200 MG tablet [id number] Yes Take 1 tablet (200 mg total) by mouth daily. Bensimhon, Bevelyn Buckles, MD Taking Active   amLODipine (NORVASC) 10 MG tablet [id number] Yes Take 1 tablet (10 mg total) by mouth daily Ivonne Andrew, NP Taking Active   Blood Pressure KIT [id number] Yes 1 Units by Does not apply  route daily. Ivonne Andrew, NP Taking Active   cefadroxil (DURICEF) 500 MG capsule [id number] Yes Take 1 capsule (500 mg total) by mouth 2 (two) times daily. Bensimhon, Bevelyn Buckles, MD Taking Active   colchicine 0.6 MG tablet [id number] Yes Take 1 tablet (0.6 mg total) by mouth once daily. Bensimhon, Bevelyn Buckles, MD Taking Active   cyanocobalamin (VITAMIN B12) 1000 MCG tablet [id number] Yes Take 1 tablet (1,000 mcg total) by mouth daily. Ivonne Andrew, NP Taking Active   cyclobenzaprine (FLEXERIL) 5 MG tablet [id number] Yes Take 1 tablet (5 mg total) by mouth 3 (three) times daily as needed for muscle spasms  Taking Active   doxazosin (CARDURA) 4 MG tablet [id number] Yes Take 2 tablets (8 mg total) by mouth 2  (two) times daily. Bensimhon, Bevelyn Buckles, MD Taking Active   empagliflozin (JARDIANCE) 10 MG TABS tablet [id number] Yes Take 1 tablet (10 mg total) by mouth daily before breakfast. Bensimhon, Bevelyn Buckles, MD Taking Active   ferrous sulfate 325 (65 FE) MG tablet [id number] Yes Take 1 tablet (325 mg total) by mouth daily before breakfast. Ivonne Andrew, NP Taking Active   folic acid (FOLVITE) 800 MCG tablet [id number] Yes Take 0.5 tablets (400 mcg total) by mouth daily. Ivonne Andrew, NP Taking Active   gabapentin (NEURONTIN) 400 MG capsule [id number] Yes Take 1 capsule (400 mg total) by mouth 3 (three) times daily. Bensimhon, Bevelyn Buckles, MD Taking Active   glucose blood test strip [id number]  Use as directed 4 times daily. Orion Crook I, NP  Active Self, Pharmacy Records  isosorbide-hydrALAZINE (BIDIL) 20-37.5 MG tablet [id number] Yes Take 2 tablets by mouth 3 (three) times daily. Bensimhon, Bevelyn Buckles, MD Taking Active   meclizine (ANTIVERT) 25 MG tablet [id number] Yes Take 1 tablet (25 mg total) by mouth 3 (three) times daily as needed for dizziness. Bensimhon, Bevelyn Buckles, MD Taking Active Self, Pharmacy Records           Med Note Delta, Desma Paganini May 05, 2023  9:08 AM) More dizziness/vertigo lately  metFORMIN (GLUCOPHAGE) 1000 MG tablet [id number] Yes Take 1 tablet (1,000 mg total) by mouth 2 (two) times daily with a meal. Bensimhon, Bevelyn Buckles, MD Taking Active   ondansetron (ZOFRAN-ODT) 4 MG disintegrating tablet [id number]  Take 1 tablet (4 mg total) by mouth every 8 (eight) hours as needed for nausea or vomiting. Romie Minus, MD  Active   pantoprazole (PROTONIX) 40 MG tablet [id number] Yes Take 1 tablet (40 mg total) by mouth once daily. Bensimhon, Bevelyn Buckles, MD Taking Active   rosuvastatin (CRESTOR) 10 MG tablet [id number] Yes Take 1 tablet (10 mg total) by mouth daily. Bensimhon, Bevelyn Buckles, MD Taking Active   sacubitril-valsartan (ENTRESTO) 97-103 MG [id number] Yes Take 1 tablet by mouth 2  (two) times daily. Bensimhon, Bevelyn Buckles, MD Taking Active Self, Pharmacy Records  Semaglutide,0.25 or 0.5MG /DOS, (OZEMPIC, 0.25 OR 0.5 MG/DOSE,) 2 MG/3ML SOPN [id number] Yes Inject 0.5 mg into the skin once a week. Ivonne Andrew, NP Taking Active   sertraline (ZOLOFT) 100 MG tablet [id number]  Take 1.5 tablets (150 mg total) by mouth daily. Bensimhon, Bevelyn Buckles, MD  Active   spironolactone (ALDACTONE) 25 MG tablet [id number] Yes Take 1 tablet (25 mg total) by mouth daily. Bensimhon, Bevelyn Buckles, MD Taking Active   torsemide (DEMADEX) 20 MG tablet [id number] Yes Take 1 tablet (20 mg total) by mouth as needed. Bensimhon, Bevelyn Buckles, MD Taking Active   traMADol HCl  100 MG TABS [id number] Yes Take 1 tablet (100 mg) by mouth every 6 (six) hours as needed for pain Bensimhon, Bevelyn Buckles, MD Taking Active   traZODone (DESYREL) 100 MG tablet [id number] Yes Take 1 tablet (100 mg total) by mouth at bedtime. Bensimhon, Bevelyn Buckles, MD Taking Active   warfarin (COUMADIN) 4 MG tablet [id number] Yes Take 12 mg (3 tabs) every Tuesday and 8 mg (2 tabs) all other days or as directed by HF clinic Bensimhon, Bevelyn Buckles, MD Taking Active             Recommendation:   Patient has started Ozempic for weight loss, will report any symptoms or difficulty obtaining medication.  He has a goal of BMI less than 35 in order to be a candidate for heart transplant (current BMI is 47.8)   Follow Up Plan:   Telephone follow-up in two weeks, Wednesday, April 23 at 9:30am.    Jeani Hawking BSN, CCM [place]  [facility] Population Health RN Care Manager Direct Dial: [phone]  Fax: [phone]

## 2024-01-11 NOTE — Patient Instructions (Signed)
 Visit Information  Thank you for taking time to visit with me today. Please don't hesitate to contact me if I can be of assistance to you before our next scheduled appointment.  Our next appointment is by telephone on Wednesday, April 23 at 9:30am  Please call the care guide team at [phone] if you need to cancel or reschedule your appointment.   I have attached an education sheet on Heart Healthy Meal plan - it's a great reminder how to fix our plates with lots of veggies on half the plate.  Portion control will help with weight loss as well.    Following is a copy of your care plan:   Goals Addressed             This Visit's Progress    VBCI RN Care Plan       Problems:  Chronic Disease Management support and education needs related to CHF and Obesity  Goal: Over the next 1 year the Patient will demonstrate a decrease CHF in exacerbations as evidenced by no hospitalizations.  demonstrate Improved adherence to prescribed treatment plan for Obesity as evidenced by weight loss with goal of less than BMI of 35.   Interventions:   Heart Failure Interventions:  (Status:  New goal.) Long Term Goal Reviewed role of diuretics in prevention of fluid overload and management of heart failure; Discussed the importance of keeping all appointments with provider Screening for signs and symptoms of depression related to chronic disease state   Weight Loss Interventions:  (Status:  New goal.) Long Term Goal Reviewed recommended dietary changes: avoid fad diets, make small/incremental dietary and exercise changes, eat at the table and avoid eating in front of the TV, plan management of cravings, monitor snacking and cravings in food diary Discussed side effects of Ozempic, he is now taking 0.5mg  weekly, no problems obtaining or taking the injection. Inform RNCM if he has any problems obtaining medication.     Patient Self-Care Activities:  Attend all scheduled provider appointments Call  provider office for new concerns or questions  Take medications as prescribed   call the Suicide and Crisis Lifeline: 988 if experiencing a Mental Health or Behavioral Health Crisis follow rescue plan if symptoms flare-up know when to call the doctor:if torsemide does not alleviate shortness of breath symptoms  Take Ozempic as directed to help with weight loss, follow activity plan, follow meal plan.   Plan:  Telephone follow up appointment with care management team member scheduled for:  Wednesday, [place]. 23rd at 9:30am.              Please call the Suicide and Crisis Lifeline: 40 if you are experiencing a Mental Health or Behavioral Health Crisis or need someone to talk to.  Patient verbalizes understanding of instructions and care plan provided today and agrees to view in MyChart. Active MyChart status and patient understanding of how to access instructions and care plan via MyChart confirmed with patient.     Jeani Hawking BSN, CCM [place]  VBCI Population Health RN Care Manager Direct Dial: [phone]  Fax: [phone]

## 2024-01-14 ENCOUNTER — Other Ambulatory Visit (HOSPITAL_COMMUNITY): Payer: Self-pay | Admitting: Unknown Physician Specialty

## 2024-01-14 DIAGNOSIS — Z95811 Presence of heart assist device: Secondary | ICD-10-CM

## 2024-01-14 DIAGNOSIS — Z7901 Long term (current) use of anticoagulants: Secondary | ICD-10-CM

## 2024-01-18 ENCOUNTER — Other Ambulatory Visit: Payer: Self-pay

## 2024-01-18 NOTE — Progress Notes (Unsigned)
 01/18/2024 Name: Jeffrey [name] MRN: [id number] DOB: 1975-11-08  Chief Complaint  Patient presents with   Diabetes   Obesity    Jeffrey [name] is a 48 y.o. year old male who presented for a telephone visit.   They were referred to the pharmacist by their PCP for assistance in managing diabetes.   PMH significant for systolic heart failure due to NICM s/p LVAD, T2DM, obesity, CKD IIIa, R renal cell carcinoma s/p nephrectomy  Subjective:  Care Team: Primary Care Provider: Ivonne Andrew, NP ; Next Scheduled Visit: 04/10/24 Cardiologist: Dr. Gala Romney; Next Scheduled Visit: 03/03/24  Medication Access/Adherence  Current Pharmacy:  [facility] - [facility] [address], [address] [place] [address] Phone: [phone] Fax: [phone]  [id number] - [place], [place] - [address] AT [facility] [address] [place] [address] Phone: [phone] Fax: [phone]   Patient reports affordability concerns with their medications: No  Patient reports access/transportation concerns to their pharmacy: No  Patient reports adherence concerns with their medications:  No     Diabetes:  Current medications: Ozempic 0.5 mg weekly (Sundays), metformin 1000 mg BID  Patient reports he has been tolerating Ozempic well. Last Thursday/Friday noted to have a couple of days of diarrhea and abdominal pain, but symptoms resolved on their own. Reports this had never happened to him before. Otherwise denies nausea/vomiting, constipation. Has not had any weight loss with Ozempic so far.  Using glucometer; testing two times daily Fasting BG: 70-100s  Patient denies hypoglycemic s/sx including dizziness, shakiness, sweating. Patient denies hyperglycemic symptoms including polyuria, polydipsia, polyphagia, nocturia, neuropathy, blurred vision.  Current meal patterns:  2 meals/day  - Breakfast: blueberries,  raspberries, apple, eggs - Supper: meat and vegetable  - Snacks: orange, kiwi - Drinks: water only  Current physical activity: walking for ~60 minutes at a time when weather is good, but has not been in the past few days   Objective:  Lab Results  Component Value Date   HGBA1C 5.5 01/05/2024    Lab Results  Component Value Date   CREATININE 1.94 (H) 01/06/2024   BUN 29 (H) 01/06/2024   NA 138 01/06/2024   K 4.3 01/06/2024   CL 109 01/06/2024   CO2 20 (L) 01/06/2024    Lab Results  Component Value Date   CHOL 123 03/18/2023   HDL 46 03/18/2023   LDLCALC 53 03/18/2023   TRIG 138 03/18/2023   CHOLHDL 2.7 03/18/2023    Medications Reviewed Today     Reviewed by Vela Prose, RPH (Pharmacist) on 01/18/24 at 1822  Med List Status: <None>   Medication Order Taking? Sig Documenting Provider Last Dose Status Informant  acetaminophen (TYLENOL) 325 MG tablet [id number]  Take 2 tablets (650 mg total) by mouth every 6 (six) hours as needed for fever. Sherald Hess, NP  Active Self, Pharmacy Records           Med Note [facility], [facility]   Thu Mar 18, 2023  2:30 PM) PRN   allopurinol (ZYLOPRIM) 100 MG tablet [id number]  Take 1 tablet (100 mg total) by mouth daily. Bensimhon, Bevelyn Buckles, MD  Active   amiodarone (PACERONE) 200 MG tablet [id number]  Take 1 tablet (200 mg total) by mouth daily. Bensimhon, Bevelyn Buckles, MD  Active   amLODipine (NORVASC) 10 MG tablet [id number]  Take 1 tablet (10 mg total) by mouth daily Ivonne Andrew, NP  Active  Blood Pressure KIT [id number]  1 Units by Does not apply route daily. Jerrlyn Morel, NP  Active   cefadroxil (DURICEF) 500 MG capsule [id number]  Take 1 capsule (500 mg total) by mouth 2 (two) times daily. Bensimhon, Daniel R, MD  Active   colchicine 0.6 MG tablet [id number]  Take 1 tablet (0.6 mg total) by mouth once daily. Bensimhon, Daniel R, MD  Active   cyanocobalamin (VITAMIN B12) 1000 MCG tablet [id number]  Take 1 tablet (1,000 mcg  total) by mouth daily. Jerrlyn Morel, NP  Active   cyclobenzaprine (FLEXERIL) 5 MG tablet [id number]  Take 1 tablet (5 mg total) by mouth 3 (three) times daily as needed for muscle spasms   Active   doxazosin (CARDURA) 4 MG tablet [id number]  Take 2 tablets (8 mg total) by mouth 2 (two) times daily. Bensimhon, Daniel R, MD  Active   empagliflozin (JARDIANCE) 10 MG TABS tablet [id number]  Take 1 tablet (10 mg total) by mouth daily before breakfast. Bensimhon, Rheta Celestine, MD  Active   ferrous sulfate 325 (65 FE) MG tablet [id number]  Take 1 tablet (325 mg total) by mouth daily before breakfast. Nichols, Tonya S, NP  Active   folic acid (FOLVITE) 800 MCG tablet [id number]  Take 0.5 tablets (400 mcg total) by mouth daily. Jerrlyn Morel, NP  Active   gabapentin (NEURONTIN) 400 MG capsule [id number]  Take 1 capsule (400 mg total) by mouth 3 (three) times daily. Bensimhon, Rheta Celestine, MD  Active   glucose blood test strip [id number]  Use as directed 4 times daily. Filbert Huff I, NP  Active Self, Pharmacy Records  isosorbide-hydrALAZINE (BIDIL) 20-37.5 MG tablet [id number]  Take 2 tablets by mouth 3 (three) times daily. Bensimhon, Daniel R, MD  Active   meclizine (ANTIVERT) 25 MG tablet [id number]  Take 1 tablet (25 mg total) by mouth 3 (three) times daily as needed for dizziness. Bensimhon, Rheta Celestine, MD  Active Self, Pharmacy Records           Med Note Whiteash, Marthe Slain May 05, 2023  9:08 AM) More dizziness/vertigo lately  metFORMIN (GLUCOPHAGE) 1000 MG tablet [id number] Yes Take 1 tablet (1,000 mg total) by mouth 2 (two) times daily with a meal. Bensimhon, Rheta Celestine, MD Taking Active   ondansetron (ZOFRAN-ODT) 4 MG disintegrating tablet [id number]  Take 1 tablet (4 mg total) by mouth every 8 (eight) hours as needed for nausea or vomiting. Lauralee Poll, MD  Active   pantoprazole (PROTONIX) 40 MG tablet [id number]  Take 1 tablet (40 mg total) by mouth once daily. Bensimhon, Daniel R, MD  Active    rosuvastatin (CRESTOR) 10 MG tablet [id number]  Take 1 tablet (10 mg total) by mouth daily. Bensimhon, Rheta Celestine, MD  Active   sacubitril-valsartan (ENTRESTO) 97-103 MG [id number]  Take 1 tablet by mouth 2 (two) times daily. Bensimhon, Rheta Celestine, MD  Active Self, Pharmacy Records  sertraline (ZOLOFT) 100 MG tablet [id number]  Take 1.5 tablets (150 mg total) by mouth daily. Bensimhon, Daniel R, MD  Active   spironolactone (ALDACTONE) 25 MG tablet [id number]  Take 1 tablet (25 mg total) by mouth daily. Bensimhon, Rheta Celestine, MD  Expired 01/13/24 2359   torsemide (DEMADEX) 20 MG tablet [id number]  Take 1 tablet (20 mg total) by mouth as needed. Bensimhon, Daniel R, MD  Active   traMADol HCl 100 MG TABS [id number]  Take 1 tablet (100 mg) by mouth every 6 (six) hours as  needed for pain Bensimhon, Rheta Celestine, MD  Active   traZODone (DESYREL) 100 MG tablet [id number]  Take 1 tablet (100 mg total) by mouth at bedtime. Bensimhon, Daniel R, MD  Active   warfarin (COUMADIN) 4 MG tablet [id number]  Take 12 mg (3 tabs) every Tuesday and 8 mg (2 tabs) all other days or as directed by HF clinic Bensimhon, Rheta Celestine, MD  Active               Assessment/Plan:   Diabetes: - Currently controlled with last A1c 5.5%, below goal <7%. Goal is to lose weight to be eligible for heart transplant - needs BMI ~35, but current BMI is 48. He has not noticed any weight loss yet and overall has been tolerating Ozempic well. Suspect episode of diarrhea and abdominal pain last week likely not related to GLP-1 since he has previously tolerated highest dose of Ozempic well and has denied any other GI symptoms since restarting on therapy about 2 months ago. Recommend to increase Ozempic to 1 mg for greater weight loss benefit. Also recommend to decrease the dose of metformin given patient has great glycemic control and most recent eGFR decreased to 42 on 01/06/24. - Reviewed long term cardiovascular and renal outcomes of uncontrolled blood  sugar - Reviewed goal A1c, goal fasting, and goal 2 hour post prandial glucose - Reviewed lifestyle modifications including: encouraged him to try to walk for 60 minutes consistently every day - Recommend to increase Ozempic to 1 mg weekly - Recommend to decrease metformin to 500 mg BID  - Patient denies personal or family history of multiple endocrine neoplasia type 2, medullary thyroid cancer; personal history of pancreatitis or gallbladder disease. - Recommend to check fasting blood glucose daily    Follow Up Plan: PharmD on 02/29/24 and PCP on 04/10/24  Georga Killings, PharmD PGY-1 Pharmacy Resident

## 2024-01-19 ENCOUNTER — Other Ambulatory Visit: Payer: Self-pay

## 2024-01-19 ENCOUNTER — Other Ambulatory Visit: Payer: Self-pay | Admitting: Nurse Practitioner

## 2024-01-19 ENCOUNTER — Other Ambulatory Visit (HOSPITAL_COMMUNITY): Payer: Self-pay | Admitting: Internal Medicine

## 2024-01-19 DIAGNOSIS — Z95811 Presence of heart assist device: Secondary | ICD-10-CM

## 2024-01-19 DIAGNOSIS — I5022 Chronic systolic (congestive) heart failure: Secondary | ICD-10-CM

## 2024-01-19 DIAGNOSIS — E1159 Type 2 diabetes mellitus with other circulatory complications: Secondary | ICD-10-CM

## 2024-01-19 DIAGNOSIS — D649 Anemia, unspecified: Secondary | ICD-10-CM

## 2024-01-19 MED ORDER — FERROUS SULFATE 325 (65 FE) MG PO TABS
325.0000 mg | ORAL_TABLET | Freq: Every day | ORAL | 1 refills | Status: DC
  Filled 2024-01-19: qty 90, 90d supply, fill #0
  Filled 2024-04-19 – 2024-08-02 (×2): qty 90, 90d supply, fill #1

## 2024-01-19 MED ORDER — SEMAGLUTIDE (1 MG/DOSE) 4 MG/3ML ~~LOC~~ SOPN
1.0000 mg | PEN_INJECTOR | SUBCUTANEOUS | 5 refills | Status: DC
  Filled 2024-01-19: qty 3, 28d supply, fill #0
  Filled 2024-02-16: qty 3, 28d supply, fill #1

## 2024-01-19 MED ORDER — METFORMIN HCL 500 MG PO TABS
500.0000 mg | ORAL_TABLET | Freq: Two times a day (BID) | ORAL | 3 refills | Status: DC
  Filled 2024-01-19 – 2024-01-26 (×2): qty 180, 90d supply, fill #0

## 2024-01-19 MED ORDER — ISOSORB DINITRATE-HYDRALAZINE 20-37.5 MG PO TABS
2.0000 | ORAL_TABLET | Freq: Three times a day (TID) | ORAL | 6 refills | Status: AC
  Filled 2024-01-19: qty 180, 30d supply, fill #0
  Filled 2024-02-16: qty 180, 30d supply, fill #1
  Filled 2024-03-17: qty 180, 30d supply, fill #2
  Filled 2024-04-19 – 2024-05-31 (×3): qty 180, 30d supply, fill #3
  Filled 2024-08-02: qty 180, 30d supply, fill #4
  Filled 2024-09-07: qty 180, 30d supply, fill #5
  Filled 2024-10-09: qty 180, 30d supply, fill #6

## 2024-01-20 ENCOUNTER — Ambulatory Visit (HOSPITAL_COMMUNITY): Payer: Self-pay | Admitting: Pharmacist

## 2024-01-20 ENCOUNTER — Other Ambulatory Visit: Payer: Self-pay

## 2024-01-20 ENCOUNTER — Ambulatory Visit (HOSPITAL_COMMUNITY)
Admission: RE | Admit: 2024-01-20 | Discharge: 2024-01-20 | Disposition: A | Discharge status: Home / Self Care | Source: Ambulatory Visit | Attending: Cardiology | Admitting: Cardiology

## 2024-01-20 DIAGNOSIS — Z7901 Long term (current) use of anticoagulants: Secondary | ICD-10-CM | POA: Insufficient documentation

## 2024-01-20 DIAGNOSIS — Z95811 Presence of heart assist device: Secondary | ICD-10-CM | POA: Insufficient documentation

## 2024-01-20 LAB — PROTIME-INR
INR: 2.6 — ABNORMAL HIGH (ref 0.8–1.2)
Prothrombin Time: 28.1 s — ABNORMAL HIGH (ref 11.4–15.2)

## 2024-01-20 LAB — BASIC METABOLIC PANEL WITH GFR
Anion gap: 5 (ref 5–15)
BUN: 21 mg/dL — ABNORMAL HIGH (ref 6–20)
CO2: 20 mmol/L — ABNORMAL LOW (ref 22–32)
Calcium: 9.8 mg/dL (ref 8.9–10.3)
Chloride: 113 mmol/L — ABNORMAL HIGH (ref 98–111)
Creatinine, Ser: 1.54 mg/dL — ABNORMAL HIGH (ref 0.61–1.24)
GFR, Estimated: 55 mL/min — ABNORMAL LOW (ref >60)
Glucose, Bld: 134 mg/dL — ABNORMAL HIGH (ref 70–99)
Potassium: 4.1 mmol/L (ref 3.5–5.1)
Sodium: 138 mmol/L (ref 135–145)

## 2024-01-21 ENCOUNTER — Other Ambulatory Visit: Payer: Self-pay

## 2024-01-26 ENCOUNTER — Other Ambulatory Visit: Payer: Self-pay

## 2024-01-26 NOTE — Patient Instructions (Signed)
 Visit Information  Mr. Riera was given information about Medicaid Managed Care team care coordination services as a part of their Healthy [facility] Medicaid benefit. [name] verbally consented to engagement with the [facility] Managed Care team.   If you are experiencing a medical emergency, please call 911 or report to your local emergency department or urgent care.   If you have a non-emergency medical problem during routine business hours, please contact your provider's office and ask to speak with a nurse.   For questions related to your Healthy [facility] health plan, please call: [phone] or visit the homepage here: [email or web address]  If you would like to schedule transportation through your Healthy [facility] plan, please call the following number at least 2 days in advance of your appointment: [phone]  For information about your ride after you set it up, call Ride Assist at [phone]. Use this number to activate a Will Call pickup, or if your transportation is late for a scheduled pickup. Use this number, too, if you need to make a change or cancel a previously scheduled reservation.  If you need transportation services right away, call [phone]. The after-hours call center is staffed 24 hours to handle ride assistance and urgent reservation requests (including discharges) 365 days a year. Urgent trips include sick visits, hospital discharge requests and life-sustaining treatment.  Call the [facility] Line at [phone], at any time, 24 hours a day, 7 days a week. If you are in danger or need immediate medical attention call 911.  If you would like help to quit smoking, call 1-800-QUIT-NOW (702-226-5867) OR Espaol: 1-855-Djelo-Ya (4-742-595-6387) o para ms informacin haga clic aqu or Text READY to 564-332 to register via text  Mr. Stockhausen - following are the goals we discussed in your visit today:   Goals  Addressed             This Visit's Progress    VBCI RN Care Plan   On track    Problems:  Chronic Disease Management support and education needs related to CHF and Obesity  Goal: Over the next 1 year the Patient will demonstrate a decrease CHF in exacerbations as evidenced by no hospitalizations.  demonstrate Improved adherence to prescribed treatment plan for Obesity as evidenced by weight loss with goal of less than BMI of 35.   Interventions:   Heart Failure Interventions:  (Status:  New goal.) Long Term Goal Reviewed role of diuretics in prevention of fluid overload and management of heart failure; Discussed the importance of keeping all appointments with provider Screening for signs and symptoms of depression related to chronic disease state   Weight Loss Interventions:  (Status:  New goal.) Long Term Goal Reviewed recommended dietary changes: avoid fad diets, make small/incremental dietary and exercise changes, eat at the table and avoid eating in front of the TV, plan management of cravings, monitor snacking and cravings in food diary Discussed side effects of Ozempic , per Pharmacist recommendation, he is now taking 0.5mg  weekly and will START 1mg  on 01/30/24 as ordered , no problems obtaining or taking the injection. Inform RNCM if he has any problems obtaining medication, and will decrease Metformin  to 500mg  BID due to A1C 5.5.    He will try to reach goal of walking 60 minutes a day, weather permitting - we discussed going to an indoor mall or [employer] to walk on days of unfavorable weather conditions.    Patient Self-Care Activities:  Attend all scheduled provider appointments Call provider office  for new concerns or questions  Take medications as prescribed   call the Suicide and Crisis Lifeline: 988 if experiencing a Mental Health or Behavioral Health Crisis follow rescue plan if symptoms flare-up know when to call the doctor:if torsemide  does not alleviate shortness of  breath symptoms  Take Ozempic  as directed to help with weight loss, follow activity plan, follow meal plan.     Plan:  Telephone follow up appointment with care management team member scheduled for:  Tuesday, May 6th at 11:45am.                Patient verbalizes understanding of instructions and care plan provided today and agrees to view in MyChart. Active MyChart status and patient understanding of how to access instructions and care plan via MyChart confirmed with patient.     The Managed Medicaid care management team will reach out to the patient again over the next 14 days.   Jurline Olmsted BSN, CCM [place]  VBCI Population Health RN Care Manager Direct Dial: [phone]  Fax: [phone]   Following is a copy of your plan of care:  There are no care plans that you recently modified to display for this patient.

## 2024-01-26 NOTE — Patient Outreach (Signed)
 Complex Care Management   Visit Note  01/26/2024  Name:  Jeffrey [name] MRN: [id number] DOB: 1976-09-12  Situation: Referral received for Complex Care Management related to Heart Failure and Obesity  I obtained verbal consent from Patient.  Visit completed with patient  on the phone  Background:   Past Medical History:  Diagnosis Date   Anxiety    Aspirin  allergy    Childhood asthma    Chronic systolic CHF (congestive heart failure) (HCC)    a. EF 20-25% in 2012. b. EF 45-50% in 10/2011 with nonischemic nuc - presumed NICM. c. 12/2014 Echo: Sev depressed LV fxn, sev dil LV, mild LVH, mild MR, sev dil LA, mildly reduced RV fxn.   CKD (chronic kidney disease) stage 2, GFR 60-89 ml/min    H/O vasectomy 12/2019   High cholesterol    Hypertension    Morbid obesity (HCC)    Nephrolithiasis    OSA on CPAP    Paroxysmal atrial fibrillation (HCC)    Presumed NICM    a. 04/2014 Myoview : EF 26%, glob HK, sev glob HK, ? prior infarct;  b. Never cathed 2/2 CKD.   Renal cell carcinoma (HCC)    a. s/p Rt robotic assisted partial converted to radical nephrectomy on 01/2013.   Troponin level elevated    a. 04/2014, 12/2014: felt due to CHF.   Type II diabetes mellitus (HCC)    Ventricular tachycardia (HCC)    a. appropriate ICD therapy 12/2017    Assessment: Patient Reported Symptoms:  Cognitive Cognitive Status: Alert and oriented to person, place, and time, Normal speech and language skills      Neurological Neurological Review of Symptoms: No symptoms reported    HEENT HEENT Symptoms Reported: No symptoms reported      Cardiovascular Cardiovascular Symptoms Reported: No symptoms reported    Respiratory Respiratory Symptoms Reported: No symptoms reported    Endocrine Patient reports the following symptoms related to hypoglycemia or hyperglycemia : No symptoms reported    Gastrointestinal Gastrointestinal Symptoms Reported: No symptoms reported      Genitourinary Genitourinary Symptoms  Reported: No symptoms reported    Integumentary Integumentary Symptoms Reported: No symptoms reported    Musculoskeletal Musculoskelatal Symptoms Reviewed: No symptoms reported        Psychosocial              01/11/2024    9:47 AM  Depression screen PHQ 2/9  Decreased Interest 1  Down, Depressed, Hopeless 1  PHQ - 2 Score 2    There were no vitals filed for this visit.  Medications Reviewed Today     Reviewed by Isadore Marble, RN (Registered Nurse) on 01/26/24 at 1349  Med List Status: <None>   Medication Order Taking? Sig Documenting Provider Last Dose Status Informant  acetaminophen  (TYLENOL ) 325 MG tablet [id number]  Take 2 tablets (650 mg total) by mouth every 6 (six) hours as needed for fever. Remigio Carl, NP  Active Self, Pharmacy Records           Med Note [facility]   Thu Mar 18, 2023  2:30 PM) PRN   allopurinol  (ZYLOPRIM ) 100 MG tablet [id number]  Take 1 tablet (100 mg total) by mouth daily. Bensimhon, Rheta Celestine, MD  Active   amiodarone  (PACERONE ) 200 MG tablet [id number]  Take 1 tablet (200 mg total) by mouth daily. Bensimhon, Rheta Celestine, MD  Active   amLODipine  (NORVASC ) 10 MG tablet [id number]  Take 1 tablet (10 mg total) by  mouth daily Nichols, Tonya S, NP  Active   Blood Pressure KIT [id number]  1 Units by Does not apply route daily. Jerrlyn Morel, NP  Active   cefadroxil  (DURICEF) 500 MG capsule [id number]  Take 1 capsule (500 mg total) by mouth 2 (two) times daily. Bensimhon, Rheta Celestine, MD  Active   colchicine  0.6 MG tablet [id number]  Take 1 tablet (0.6 mg total) by mouth once daily. Bensimhon, Rheta Celestine, MD  Active   cyanocobalamin  (VITAMIN B12) 1000 MCG tablet [id number]  Take 1 tablet (1,000 mcg total) by mouth daily. Jerrlyn Morel, NP  Active   cyclobenzaprine  (FLEXERIL ) 5 MG tablet [id number]  Take 1 tablet (5 mg total) by mouth 3 (three) times daily as needed for muscle spasms   Active   doxazosin  (CARDURA ) 4 MG tablet [id number]  Take 2  tablets (8 mg total) by mouth 2 (two) times daily. Bensimhon, Rheta Celestine, MD  Active   empagliflozin  (JARDIANCE ) 10 MG TABS tablet [id number]  Take 1 tablet (10 mg total) by mouth daily before breakfast. Bensimhon, Rheta Celestine, MD  Active   ferrous sulfate  (FEROSUL) 325 (65 FE) MG tablet [id number]  Take 1 tablet (325 mg total) by mouth daily before breakfast. Nichols, Tonya S, NP  Active   folic acid  (FOLVITE ) 800 MCG tablet [id number]  Take 0.5 tablets (400 mcg total) by mouth daily. Jerrlyn Morel, NP  Active   gabapentin  (NEURONTIN ) 400 MG capsule [id number]  Take 1 capsule (400 mg total) by mouth 3 (three) times daily. Bensimhon, Rheta Celestine, MD  Active   glucose blood test strip [id number]  Use as directed 4 times daily. Filbert Huff I, NP  Active Self, Pharmacy Records  isosorbide -hydrALAZINE  (BIDIL ) 20-37.5 MG tablet [id number]  Take 2 tablets by mouth 3 (three) times daily. Bensimhon, Rheta Celestine, MD  Active   meclizine  (ANTIVERT ) 25 MG tablet [id number]  Take 1 tablet (25 mg total) by mouth 3 (three) times daily as needed for dizziness. Bensimhon, Rheta Celestine, MD  Active Self, Pharmacy Records           Med Note Cook, Marthe Slain May 05, 2023  9:08 AM) More dizziness/vertigo lately  metFORMIN  (GLUCOPHAGE ) 500 MG tablet [id number] Yes Take 1 tablet (500 mg total) by mouth 2 (two) times daily with a meal. Jerrlyn Morel, NP Taking Active   ondansetron  (ZOFRAN -ODT) 4 MG disintegrating tablet [id number]  Take 1 tablet (4 mg total) by mouth every 8 (eight) hours as needed for nausea or vomiting. Lauralee Poll, MD  Active   pantoprazole  (PROTONIX ) 40 MG tablet [id number]  Take 1 tablet (40 mg total) by mouth once daily. Bensimhon, Rheta Celestine, MD  Active   rosuvastatin  (CRESTOR ) 10 MG tablet [id number]  Take 1 tablet (10 mg total) by mouth daily. Bensimhon, Rheta Celestine, MD  Active   sacubitril -valsartan  (ENTRESTO ) 97-103 MG [id number]  Take 1 tablet by mouth 2 (two) times daily. Bensimhon, Rheta Celestine, MD   Active Self, Pharmacy Records  Semaglutide , 1 MG/DOSE, 4 MG/3ML SOPN [id number] Yes Inject 1 mg as directed once a week. Jerrlyn Morel, NP Taking Active   sertraline  (ZOLOFT ) 100 MG tablet [id number]  Take 1.5 tablets (150 mg total) by mouth daily. Bensimhon, Rheta Celestine, MD  Active   spironolactone  (ALDACTONE ) 25 MG tablet [id number]  Take 1 tablet (25 mg total) by mouth daily. Bensimhon, Rheta Celestine, MD  Active   torsemide  (DEMADEX ) 20 MG tablet [id number]  Take 1 tablet (20  mg total) by mouth as needed. Bensimhon, Rheta Celestine, MD  Active   traMADol  HCl 100 MG TABS [id number]  Take 1 tablet (100 mg) by mouth every 6 (six) hours as needed for pain Bensimhon, Rheta Celestine, MD  Active   traZODone  (DESYREL ) 100 MG tablet [id number]  Take 1 tablet (100 mg total) by mouth at bedtime. Bensimhon, Daniel R, MD  Active   warfarin (COUMADIN ) 4 MG tablet [id number] Yes Take 12 mg (3 tabs) every Tuesday and 8 mg (2 tabs) all other days or as directed by HF clinic Bensimhon, Rheta Celestine, MD Taking Active             Recommendation:   Specialty provider follow-up Gastro appt 01/27/24, Cardio appt 02/03/24 Patient will increase Ozempic  to 1mg  every week starting 01/30/24, and has decreased Metformin  to 500mg  twice daily as recommended by Pharmacist.   Patient will continue to work on weight loss, increasing activity with a goal to walk 60 minutes a day.  Follow Up Plan:   Telephone follow up appointment date/time:  Tuesday, May 6th at 11:45am.   Jurline Olmsted BSN, CCM [place]  [facility] RN Care Manager Direct Dial: [phone]  Fax: [phone]

## 2024-01-27 ENCOUNTER — Ambulatory Visit: Admitting: Nurse Practitioner

## 2024-01-27 ENCOUNTER — Other Ambulatory Visit: Payer: Self-pay

## 2024-01-27 ENCOUNTER — Encounter: Payer: Self-pay | Admitting: Nurse Practitioner

## 2024-01-27 VITALS — HR 47 | Ht 68.5 in | Wt 303.5 lb

## 2024-01-27 DIAGNOSIS — K219 Gastro-esophageal reflux disease without esophagitis: Secondary | ICD-10-CM | POA: Diagnosis not present

## 2024-01-27 DIAGNOSIS — K625 Hemorrhage of anus and rectum: Secondary | ICD-10-CM

## 2024-01-27 DIAGNOSIS — K648 Other hemorrhoids: Secondary | ICD-10-CM | POA: Diagnosis not present

## 2024-01-27 DIAGNOSIS — R109 Unspecified abdominal pain: Secondary | ICD-10-CM

## 2024-01-27 DIAGNOSIS — R197 Diarrhea, unspecified: Secondary | ICD-10-CM | POA: Diagnosis not present

## 2024-01-27 DIAGNOSIS — K644 Residual hemorrhoidal skin tags: Secondary | ICD-10-CM

## 2024-01-27 MED ORDER — HYDROCORTISONE ACETATE 25 MG RE SUPP
25.0000 mg | Freq: Every day | RECTAL | 1 refills | Status: DC
  Filled 2024-01-27: qty 5, 5d supply, fill #0
  Filled 2024-04-19: qty 5, 5d supply, fill #1

## 2024-01-27 NOTE — Progress Notes (Signed)
 01/27/2024 Jeffrey [name] [id number] [birth year]   CHIEF COMPLAINT: Blood in stool   HISTORY OF PRESENT ILLNESS: Jeffrey [name] is a 48 year old male with a past medical history significant for anxiety, gout, hypertension, severe cardiomyopathy with LV EF < 20% per ECHO 07/2021 s/p AICD placement 2016 and s/p LVAD 07/2021 on Warfarin, paroxysmal atrial fibrillation, renal cell carcinoma s/p right nephrectomy 2014, CKD stage IIIa and external/internal hemorrhoids. He presents to our office today as referred by Abbey Hobby NP for further evaluation regarding bloody stools. He is known by Dr. Willy Harvest. He endorses seeing a small amount of bright red blood on the stool and toilet tissue twice monthly for the past 3 to 4 months. No rectal pain. He has central abdominal which wraps around to his back 3 times monthly, lasts one or two days then goes away. No abdominal pain at this time.  He passes a normal formed stool 3 times daily. No straining. He has infrequent diarrhea which he believes is due to taking Colchicine  and occurs 3 days weekly.  He developed reflux symptoms within the past year and was started on Pantoprazole  40 mg daily and his symptoms abated.  No dysphagia.  He underwent an EGD and colonoscopy 02/10/2021.  The EGD showed mild chronic gastritis without evidence of H. pylori.  The colonoscopy identified internal and external hemorrhoids and internal hemorrhoid was banding if rectal bleeding persists food.  Diverticulosis was also noted without evidence of colon polyps.  He was advised to repeat a colonoscopy in 10 years.  No known family history of colon polyps or colorectal cancer.     Latest Ref Rng & Units 01/06/2024   10:17 AM 11/12/2023   11:06 AM 10/21/2023   12:37 PM  CBC  WBC 4.0 - 10.5 K/uL 6.1  5.8  5.0   Hemoglobin 13.0 - 17.0 g/dL 40.9  81.1  91.4   Hematocrit 39.0 - 52.0 % 44.9  39.4  42.9   Platelets 150 - 400 K/uL 197  198  186        Latest Ref Rng & Units 01/20/2024    12:05 PM 01/06/2024   10:17 AM 11/12/2023   11:06 AM  CMP  Glucose 70 - 99 mg/dL 782  956  213   BUN 6 - 20 mg/dL 21  29  19    Creatinine 0.61 - 1.24 mg/dL 0.86  5.78  4.69   Sodium 135 - 145 mmol/L 138  138  141   Potassium 3.5 - 5.1 mmol/L 4.1  4.3  4.0   Chloride 98 - 111 mmol/L 113  109  111   CO2 22 - 32 mmol/L 20  20  19    Calcium  8.9 - 10.3 mg/dL 9.8  9.8  9.6   Total Protein 6.5 - 8.1 g/dL  6.6    Total Bilirubin 0.0 - 1.2 mg/dL  0.8    Alkaline Phos 38 - 126 U/L  56    AST 15 - 41 U/L  26    ALT 0 - 44 U/L  21       ECHO 07/30/2021: IMPRESSIONS Left ventricular ejection fraction, by estimation, is <20%. The left ventricle has severely decreased function. The left ventricle demonstrates global hypokinesis. The left ventricular internal cavity size was moderately dilated. The septum is shifted towards the RV. 1. Right ventricular systolic function is moderately reduced. The right ventricular size is mildly enlarged. 2. 3. The aortic valve opens 2/3 beats. The aortic  valve is tricuspid. 4. LVAD ramp echo, the LVAD inflow cannula is not visualized.   PAST GI PROCEDURES:  EGD 02/10/2021: - Erythematous mucosa in the antrum. Biopsied.  - The examination was otherwise normal. A. STOMACH, ANTRUM AND BODY, BIOPSY:  - Mild chronic gastritis.  - Warthin-Starry is negative for Helicobacter pylori.  - No intestinal metaplasia, dysplasia, or malignancy.   Colonoscopy 02/10/2021: - External and internal hemorrhoids. If bleeding becvomes major issue consider banding internal hemorrhoids  - Diverticulosis in the sigmoid colon.  - The examination was otherwise normal on direct and retroflexion views.  - No specimens collected. - 10 Year recall colonoscopy   Past Medical History:  Diagnosis Date   Anxiety    Aspirin  allergy    Childhood asthma    Chronic systolic CHF (congestive heart failure) (HCC)    a. EF 20-25% in 2012. b. EF 45-50% in 10/2011 with nonischemic nuc - presumed NICM. c.  12/2014 Echo: Sev depressed LV fxn, sev dil LV, mild LVH, mild MR, sev dil LA, mildly reduced RV fxn.   CKD (chronic kidney disease) stage 2, GFR 60-89 ml/min    H/O vasectomy 12/2019   High cholesterol    Hypertension    Morbid obesity (HCC)    Nephrolithiasis    OSA on CPAP    Paroxysmal atrial fibrillation (HCC)    Presumed NICM    a. 04/2014 Myoview : EF 26%, glob HK, sev glob HK, ? prior infarct;  b. Never cathed 2/2 CKD.   Renal cell carcinoma (HCC)    a. s/p Rt robotic assisted partial converted to radical nephrectomy on 01/2013.   Troponin level elevated    a. 04/2014, 12/2014: felt due to CHF.   Type II diabetes mellitus (HCC)    Ventricular tachycardia (HCC)    a. appropriate ICD therapy 12/2017   Past Surgical History:  Procedure Laterality Date   APPENDECTOMY  07/2004   APPLICATION OF WOUND VAC N/A 08/12/2022   Procedure: APPLICATION OF WOUND VAC;  Surgeon: Shon Downing, MD;  Location: [facility] OR;  Service: Thoracic;  Laterality: N/A;   APPLICATION OF WOUND VAC N/A 08/18/2022   Procedure: WOUND VAC CHANGE;  Surgeon: Shon Downing, MD;  Location: [facility] OR;  Service: Thoracic;  Laterality: N/A;   BIOPSY  02/10/2021   Procedure: BIOPSY;  Surgeon: Kenney Peacemaker, MD;  Location: [facility] ENDOSCOPY;  Service: Endoscopy;;   CARDIAC CATHETERIZATION N/A 05/17/2015   Procedure: Right/Left Heart Cath and Coronary Angiography;  Surgeon: Mardell Shade, MD;  Location: [facility] INVASIVE CV LAB;  Service: Cardiovascular;  Laterality: N/A;   CARDIOVERSION N/A 04/30/2021   Procedure: CARDIOVERSION;  Surgeon: Darlis Eisenmenger, MD;  Location: [facility] ENDOSCOPY;  Service: Cardiovascular;  Laterality: N/A;   CARDIOVERSION N/A 07/27/2021   Procedure: CARDIOVERSION;  Surgeon: Mardell Shade, MD;  Location: [name] OR;  Service: Cardiovascular;  Laterality: N/A;   COLONOSCOPY WITH PROPOFOL  N/A 02/10/2021   Procedure: COLONOSCOPY WITH PROPOFOL ;  Surgeon: Kenney Peacemaker, MD;  Location: [facility] ENDOSCOPY;  Service: Endoscopy;   Laterality: N/A;   EP IMPLANTABLE DEVICE N/A 05/17/2015   Procedure: SubQ ICD Implant;  Surgeon: Will Cortland Ding, MD;  Location: [facility] INVASIVE CV LAB;  Service: Cardiovascular;  Laterality: N/A;   ESOPHAGOGASTRODUODENOSCOPY (EGD) WITH PROPOFOL  N/A 02/10/2021   Procedure: ESOPHAGOGASTRODUODENOSCOPY (EGD) WITH PROPOFOL ;  Surgeon: Kenney Peacemaker, MD;  Location: [facility] ENDOSCOPY;  Service: Endoscopy;  Laterality: N/A;   INSERTION OF IMPLANTABLE LEFT VENTRICULAR ASSIST DEVICE N/A 07/21/2021   Procedure: INSERTION OF  IMPLANTABLE LEFT VENTRICULAR ASSIST DEVICE;  Surgeon: Bartley Lightning, MD;  Location: [facility] OR;  Service: Open Heart Surgery;  Laterality: N/A;   RIGHT HEART CATH N/A 05/13/2021   Procedure: RIGHT HEART CATH;  Surgeon: Mardell Shade, MD;  Location: [facility] INVASIVE CV LAB;  Service: Cardiovascular;  Laterality: N/A;   RIGHT HEART CATH N/A 07/18/2021   Procedure: RIGHT HEART CATH;  Surgeon: Mardell Shade, MD;  Location: [facility] INVASIVE CV LAB;  Service: Cardiovascular;  Laterality: N/A;   ROBOTIC ASSISTED LAPAROSCOPIC LYSIS OF ADHESION  01/13/2013   Procedure: ROBOTIC ASSISTED LAPAROSCOPIC LYSIS OF ADHESION EXTENSIVE;  Surgeon: Osborn Blaze, MD;  Location: [facility] ORS;  Service: Urology;;   ROBOTIC ASSITED PARTIAL NEPHRECTOMY Right 01/13/2013   Procedure: ROBOTIC ASSITED PARTIAL NEPHRECTOMY CONVERTED TO ROBOTIC ASSISTED RIGHT RADICAL NEPHRECTOMY;  Surgeon: Osborn Blaze, MD;  Location: [facility] ORS;  Service: Urology;  Laterality: Right;   STERNAL WOUND DEBRIDEMENT N/A 08/12/2022   Procedure: ABDOMINAL WOUND DEBRIDEMENT;  Surgeon: Shon Downing, MD;  Location: [facility] OR;  Service: Thoracic;  Laterality: N/A;   STERNAL WOUND DEBRIDEMENT N/A 08/18/2022   Procedure: ABDOMINAL WOUND DEBRIDEMENT;  Surgeon: Shon Downing, MD;  Location: [facility] OR;  Service: Thoracic;  Laterality: N/A;   SUBQ ICD CHANGEOUT N/A 05/16/2020   Procedure: HYQM ICD CHANGEOUT;  Surgeon: Boyce Byes, MD;  Location: [facility] INVASIVE CV LAB;   Service: Cardiovascular;  Laterality: N/A;   SUBQ ICD CHANGEOUT N/A 05/15/2020   Procedure: SUBQ ICD CHANGEOUT;  Surgeon: Verona Goodwill, MD;  Location: [facility] INVASIVE CV LAB;  Service: Cardiovascular;  Laterality: N/A;   TEE WITHOUT CARDIOVERSION N/A 04/30/2021   Procedure: TRANSESOPHAGEAL ECHOCARDIOGRAM (TEE);  Surgeon: Darlis Eisenmenger, MD;  Location: [facility] ENDOSCOPY;  Service: Cardiovascular;  Laterality: N/A;   TEE WITHOUT CARDIOVERSION N/A 07/21/2021   Procedure: TRANSESOPHAGEAL ECHOCARDIOGRAM (TEE);  Surgeon: Bartley Lightning, MD;  Location: [facility] OR;  Service: Open Heart Surgery;  Laterality: N/A;   VASECTOMY     Social History: He is married.  He has 7 sons and 7 daughters.  Past smoker, quit smoking cigarettes 8 years ago.  No alcohol use.  + Marijuana use.  Family History: No known family history of colon cancer.  Mother with hypertension.  Maternal aunt with diabetes.  Allergies  Allergen Reactions   Aspirin  Shortness Of Breath, Itching and Rash     Burning sensation (Patient reports he tolerates other NSAIDS)    Bee Venom Hives and Swelling   Lisinopril  Cough and Other (See Comments)   Tomato Hives     Outpatient Encounter Medications as of 01/27/2024  Medication Sig   acetaminophen  (TYLENOL ) 325 MG tablet Take 2 tablets (650 mg total) by mouth every 6 (six) hours as needed for fever.   allopurinol  (ZYLOPRIM ) 100 MG tablet Take 1 tablet (100 mg total) by mouth daily.   amiodarone  (PACERONE ) 200 MG tablet Take 1 tablet (200 mg total) by mouth daily.   amLODipine  (NORVASC ) 10 MG tablet Take 1 tablet (10 mg total) by mouth daily   Blood Pressure KIT 1 Units by Does not apply route daily.   cefadroxil  (DURICEF) 500 MG capsule Take 1 capsule (500 mg total) by mouth 2 (two) times daily.   colchicine  0.6 MG tablet Take 1 tablet (0.6 mg total) by mouth once daily.   cyanocobalamin  (VITAMIN B12) 1000 MCG tablet Take 1 tablet (1,000 mcg total) by mouth daily.   cyclobenzaprine  (FLEXERIL ) 5 MG  tablet Take 1 tablet (5 mg total) by mouth  3 (three) times daily as needed for muscle spasms   doxazosin  (CARDURA ) 4 MG tablet Take 2 tablets (8 mg total) by mouth 2 (two) times daily.   empagliflozin  (JARDIANCE ) 10 MG TABS tablet Take 1 tablet (10 mg total) by mouth daily before breakfast.   ferrous sulfate  (FEROSUL) 325 (65 FE) MG tablet Take 1 tablet (325 mg total) by mouth daily before breakfast.   folic acid  (FOLVITE ) 800 MCG tablet Take 0.5 tablets (400 mcg total) by mouth daily.   gabapentin  (NEURONTIN ) 400 MG capsule Take 1 capsule (400 mg total) by mouth 3 (three) times daily.   glucose blood test strip Use as directed 4 times daily.   isosorbide -hydrALAZINE  (BIDIL ) 20-37.5 MG tablet Take 2 tablets by mouth 3 (three) times daily.   meclizine  (ANTIVERT ) 25 MG tablet Take 1 tablet (25 mg total) by mouth 3 (three) times daily as needed for dizziness.   metFORMIN  (GLUCOPHAGE ) 500 MG tablet Take 1 tablet (500 mg total) by mouth 2 (two) times daily with a meal.   ondansetron  (ZOFRAN -ODT) 4 MG disintegrating tablet Take 1 tablet (4 mg total) by mouth every 8 (eight) hours as needed for nausea or vomiting.   pantoprazole  (PROTONIX ) 40 MG tablet Take 1 tablet (40 mg total) by mouth once daily.   rosuvastatin  (CRESTOR ) 10 MG tablet Take 1 tablet (10 mg total) by mouth daily.   sacubitril -valsartan  (ENTRESTO ) 97-103 MG Take 1 tablet by mouth 2 (two) times daily.   Semaglutide , 1 MG/DOSE, 4 MG/3ML SOPN Inject 1 mg as directed once a week.   sertraline  (ZOLOFT ) 100 MG tablet Take 1.5 tablets (150 mg total) by mouth daily.   spironolactone  (ALDACTONE ) 25 MG tablet Take 1 tablet (25 mg total) by mouth daily.   torsemide  (DEMADEX ) 20 MG tablet Take 1 tablet (20 mg total) by mouth as needed.   traMADol  HCl 100 MG TABS Take 1 tablet (100 mg) by mouth every 6 (six) hours as needed for pain   traZODone  (DESYREL ) 100 MG tablet Take 1 tablet (100 mg total) by mouth at bedtime.   warfarin (COUMADIN ) 4 MG  tablet Take 12 mg (3 tabs) every Tuesday and 8 mg (2 tabs) all other days or as directed by HF clinic   No facility-administered encounter medications on file as of 01/27/2024.   REVIEW OF SYSTEMS:  Gen: Denies fever, sweats or chills. No weight loss.  CV: See HPI. Resp: Denies cough, shortness of breath of hemoptysis.  GI: See HPI. GU: Denies urinary burning, blood in urine, increased urinary frequency or incontinence. MS: Denies joint pain, muscles aches or weakness. Derm: Denies rash, itchiness, skin lesions or unhealing ulcers. Psych: Denies depression, anxiety, memory loss or confusion. Heme: Denies bruising, easy bleeding. Neuro:  Denies headaches, dizziness or paresthesias. Endo:  Denies any problems with DM, thyroid  or adrenal function.  PHYSICAL EXAM: Pulse (!) 47   Ht 5' 8.5" (1.74 m)   Wt (!) 303 lb 8 oz (137.7 kg) Comment: height measured without shoes  BMI 45.48 kg/m systolic BP 84, unable to auscultate diastolic pressure.  Repeat Heart rate 76.  General: 48 year old male in no acute distress. Head: Normocephalic and atraumatic. Eyes:  Sclerae non-icteric, conjunctive pink. Ears: Normal auditory acuity. Mouth: Dentition intact. No ulcers or lesions.  Neck: Supple, no lymphadenopathy or thyromegaly.  Lungs: Clear bilaterally to auscultation without wheezes, crackles or rhonchi. Heart: Regular rate and rhythm, + LVAD hum.  Abdomen: Soft, nontender, nondistended. No masses. No hepatosplenomegaly. Normoactive bowel sounds x 4  quadrants.  LVAD dressing intact. Rectal: Patient declined rectal exam today.  Musculoskeletal: Symmetrical with no gross deformities. Skin: Warm and dry. No rash or lesions on visible extremities. Extremities: Bilateral lower extremity edema. Neurological: Alert oriented x 4, no focal deficits.  Psychological:  Alert and cooperative. Normal mood and affect.  ASSESSMENT AND PLAN:  48 year old male with intermittent rectal bleeding on Warfarin.   Colonoscopy 02/2021 identified internal and external hemorrhoids, diverticulosis without colon polyps.  Hemoglobin 14.5. -Apply a small amount of Desitin inside the anal opening and to the external anal area three times daily as needed for anal or hemorrhoidal irritation/bleeding.  -Anusol  HC 25 mg suppository 1 PR nightly x 5 nights as needed -Benefiber 1 tablespoon daily as tolerated -Consider future internal hemorrhoid banding if rectal bleeding persists or worsens, to discuss further with Dr. Willy Harvest  Nonbloody loose stools once weekly, possibly secondary to Colchicine . - Patient to contact office if loose stools persist or worsen - Benefiber as noted above if tolerated  Intermittent central abdominal pain which occurs 3 times monthly and lasts for 1 to 2 days then abates.  No severe abdominal pain.  No abdominal pain at this time. - Patient to contact office if abdominal pain recurs - CTAP with central abdominal pain persists/recurs  GERD.  EGD 02/2021 showed mild chronic gastritis. - Continue Pantoprazole  40 mg daily  Colon cancer screening.  No polyps per colonoscopy 02/2021. - Next screening colonoscopy due 02/2031  Atrial fibrillation on amiodarone  and Warfarin  End-stage nonischemic cardiomyopathy s/p AICD, s/p LVAD. SBP 84, patient asymptomatic. - Continue follow-up with Dr. Julane Ny  CC:  Jerrlyn Morel, NP  Gastroenterology clinic physician:  I would reserve treatment of hemorrhoids with ligation for significant anemia unable to control medically.  I do not think the risk-benefit ratio favors hemorrhoidal banding in this patient at the present time.  Continue conservative care.  Kenney Peacemaker, MD, [facility]

## 2024-01-27 NOTE — Patient Instructions (Addendum)
 Apply a small amount of Desitin inside the anal opening and to the external anal area three times daily as needed for anal or hemorrhoidal irritation/bleeding.   Benefiber one tablespoon once daily as needed to avoid hard stools or straining to pass a bowel movement   Continue follow up with your cardiologist and primary care provider to monitor your red blood cell counts   Anusol  25mg  suppository insert one suppository inside the rectum at bed time x 5 nights for internal hemorrhoids   Contact our office if your rectal bleeding persists or worsens    Due to recent changes in healthcare laws, you may see the results of your imaging and laboratory studies on MyChart before your provider has had a chance to review them.  We understand that in some cases there may be results that are confusing or concerning to you. Not all laboratory results come back in the same time frame and the provider may be waiting for multiple results in order to interpret others.  Please give us  48 hours in order for your provider to thoroughly review all the results before contacting the office for clarification of your results.   Thank you for trusting me with your gastrointestinal care!   Everett Hitt, CRNP

## 2024-01-31 NOTE — Progress Notes (Signed)
 Contacted patient and patient verbalized understanding of recommendations

## 2024-01-31 NOTE — Progress Notes (Signed)
 Attempted to contact patient and left a voicemail for patient to return call.

## 2024-02-03 ENCOUNTER — Other Ambulatory Visit (HOSPITAL_COMMUNITY): Payer: Self-pay | Admitting: *Deleted

## 2024-02-03 ENCOUNTER — Other Ambulatory Visit (HOSPITAL_COMMUNITY)

## 2024-02-03 DIAGNOSIS — Z95811 Presence of heart assist device: Secondary | ICD-10-CM

## 2024-02-03 DIAGNOSIS — Z7901 Long term (current) use of anticoagulants: Secondary | ICD-10-CM

## 2024-02-03 DIAGNOSIS — I5022 Chronic systolic (congestive) heart failure: Secondary | ICD-10-CM

## 2024-02-04 ENCOUNTER — Ambulatory Visit (HOSPITAL_COMMUNITY): Payer: Self-pay | Admitting: Pharmacist

## 2024-02-04 ENCOUNTER — Ambulatory Visit (HOSPITAL_COMMUNITY)
Admission: RE | Admit: 2024-02-04 | Discharge: 2024-02-04 | Disposition: A | Discharge status: Home / Self Care | Source: Ambulatory Visit | Attending: Internal Medicine | Admitting: Internal Medicine

## 2024-02-04 DIAGNOSIS — Z95811 Presence of heart assist device: Secondary | ICD-10-CM | POA: Insufficient documentation

## 2024-02-04 DIAGNOSIS — Z7901 Long term (current) use of anticoagulants: Secondary | ICD-10-CM | POA: Diagnosis not present

## 2024-02-04 DIAGNOSIS — I5022 Chronic systolic (congestive) heart failure: Secondary | ICD-10-CM | POA: Insufficient documentation

## 2024-02-04 LAB — PROTIME-INR
INR: 2.3 — ABNORMAL HIGH (ref 0.8–1.2)
Prothrombin Time: 25.3 s — ABNORMAL HIGH (ref 11.4–15.2)

## 2024-02-08 ENCOUNTER — Other Ambulatory Visit: Payer: Self-pay

## 2024-02-08 NOTE — Patient Outreach (Signed)
 Complex Care Management   Visit Note  02/08/2024  Name:  Jeffrey [name] MRN: [id number] DOB: 12-09-1975  Situation: Referral received for Complex Care Management related to Heart Failure and weight loss to prepare for heart transplant, has LVAD.  I obtained verbal consent from Patient.  Visit completed with patient  on the phone  Background:   Past Medical History:  Diagnosis Date   Anxiety    Aspirin  allergy    Childhood asthma    Chronic systolic CHF (congestive heart failure) (HCC)    a. EF 20-25% in 2012. b. EF 45-50% in 10/2011 with nonischemic nuc - presumed NICM. c. 12/2014 Echo: Sev depressed LV fxn, sev dil LV, mild LVH, mild MR, sev dil LA, mildly reduced RV fxn.   CKD (chronic kidney disease) stage 2, GFR 60-89 ml/min    Depression    H/O vasectomy 12/2019   High cholesterol    Hypertension    Morbid obesity (HCC)    Nephrolithiasis    OSA on CPAP    Paroxysmal atrial fibrillation (HCC)    Presumed NICM    a. 04/2014 Myoview : EF 26%, glob HK, sev glob HK, ? prior infarct;  b. Never cathed 2/2 CKD.   Renal cell carcinoma (HCC)    a. s/p Rt robotic assisted partial converted to radical nephrectomy on 01/2013.   Troponin level elevated    a. 04/2014, 12/2014: felt due to CHF.   Type II diabetes mellitus (HCC)    Ventricular tachycardia (HCC)    a. appropriate ICD therapy 12/2017    Assessment: Patient Reported Symptoms:  Cognitive Cognitive Status: Alert and oriented to person, place, and time, Normal speech and language skills      Neurological Neurological Review of Symptoms: No symptoms reported    HEENT HEENT Symptoms Reported: No symptoms reported      Cardiovascular      Respiratory Respiratory Symptoms Reported: No symptoms reported    Endocrine Patient reports the following symptoms related to hypoglycemia or hyperglycemia : No symptoms reported (Reports recent blood sugars have been within normal limits, FSB not under 80 and not over 100.)     Gastrointestinal Gastrointestinal Symptoms Reported: No symptoms reported      Genitourinary Genitourinary Symptoms Reported: No symptoms reported    Integumentary Integumentary Symptoms Reported: No symptoms reported    Musculoskeletal Musculoskelatal Symptoms Reviewed: No symptoms reported   Falls in the past year?: No    Psychosocial              01/11/2024    9:47 AM  Depression screen PHQ 2/9  Decreased Interest 1  Down, Depressed, Hopeless 1  PHQ - 2 Score 2    There were no vitals filed for this visit.  Medications Reviewed Today     Reviewed by Lucian Santana LABOR, RN (Registered Nurse) on 02/08/24 at 1158  Med List Status: <None>   Medication Order Taking? Sig Documenting Provider Last Dose Status Informant  acetaminophen  (TYLENOL ) 325 MG tablet [id number]  Take 2 tablets (650 mg total) by mouth every 6 (six) hours as needed for fever. Lenetta Greig BIRCH, NP  Active Self, Pharmacy Records           Med Note [facility]   Thu Mar 18, 2023  2:30 PM) PRN   allopurinol  (ZYLOPRIM ) 100 MG tablet [id number]  Take 1 tablet (100 mg total) by mouth daily. Bensimhon, Toribio SAUNDERS, MD  Active   amiodarone  (PACERONE ) 200 MG tablet [id number]  Take 1  tablet (200 mg total) by mouth daily. Bensimhon, Toribio SAUNDERS, MD  Active   amLODipine  (NORVASC ) 10 MG tablet [id number]  Take 1 tablet (10 mg total) by mouth daily Nichols, Tonya S, NP  Active   Blood Pressure KIT [id number]  1 Units by Does not apply route daily. Oley Bascom RAMAN, NP  Active   cefadroxil  (DURICEF) 500 MG capsule [id number]  Take 1 capsule (500 mg total) by mouth 2 (two) times daily. Bensimhon, Toribio SAUNDERS, MD  Active   colchicine  0.6 MG tablet [id number]  Take 1 tablet (0.6 mg total) by mouth once daily. Bensimhon, Toribio SAUNDERS, MD  Active   cyanocobalamin  (VITAMIN B12) 1000 MCG tablet [id number]  Take 1 tablet (1,000 mcg total) by mouth daily. Oley Bascom RAMAN, NP  Active   cyclobenzaprine  (FLEXERIL ) 5 MG tablet [id number]  Take  1 tablet (5 mg total) by mouth 3 (three) times daily as needed for muscle spasms   Active   doxazosin  (CARDURA ) 4 MG tablet [id number]  Take 2 tablets (8 mg total) by mouth 2 (two) times daily. Bensimhon, Toribio SAUNDERS, MD  Active   empagliflozin  (JARDIANCE ) 10 MG TABS tablet [id number]  Take 1 tablet (10 mg total) by mouth daily before breakfast. Bensimhon, Toribio SAUNDERS, MD  Active   ferrous sulfate  (FEROSUL) 325 (65 FE) MG tablet [id number]  Take 1 tablet (325 mg total) by mouth daily before breakfast. Nichols, Tonya S, NP  Active   folic acid  (FOLVITE ) 800 MCG tablet [id number]  Take 0.5 tablets (400 mcg total) by mouth daily. Oley Bascom RAMAN, NP  Active   gabapentin  (NEURONTIN ) 400 MG capsule [id number]  Take 1 capsule (400 mg total) by mouth 3 (three) times daily. Bensimhon, Toribio SAUNDERS, MD  Active   glucose blood test strip [id number]  Use as directed 4 times daily. Shannan Sia I, NP  Active Self, Pharmacy Records  hydrocortisone  (ANUSOL -HC) 25 MG suppository [id number]  Place 1 suppository (25 mg total) rectally at bedtime. Kennedy-Smith, Colleen M, NP  Active   isosorbide -hydrALAZINE  (BIDIL ) 20-37.5 MG tablet [id number]  Take 2 tablets by mouth 3 (three) times daily. Bensimhon, Toribio SAUNDERS, MD  Active   meclizine  (ANTIVERT ) 25 MG tablet [id number]  Take 1 tablet (25 mg total) by mouth 3 (three) times daily as needed for dizziness. Bensimhon, Toribio SAUNDERS, MD  Active Self, Pharmacy Records           Med Note [place], DOROTHYANN ONEIDA [place] May 05, 2023  9:08 AM) More dizziness/vertigo lately  metFORMIN  (GLUCOPHAGE ) 500 MG tablet [id number]  Take 1 tablet (500 mg total) by mouth 2 (two) times daily with a meal. Oley Bascom RAMAN, NP  Active   ondansetron  (ZOFRAN -ODT) 4 MG disintegrating tablet [id number] Yes Take 1 tablet (4 mg total) by mouth every 8 (eight) hours as needed for nausea or vomiting. Zenaida Morene PARAS, MD Taking Active   pantoprazole  (PROTONIX ) 40 MG tablet [id number] Yes Take 1 tablet (40 mg total) by mouth  once daily. Bensimhon, Toribio SAUNDERS, MD Taking Active   rosuvastatin  (CRESTOR ) 10 MG tablet [id number]  Take 1 tablet (10 mg total) by mouth daily. Bensimhon, Toribio SAUNDERS, MD  Active   sacubitril -valsartan  (ENTRESTO ) 97-103 MG [id number]  Take 1 tablet by mouth 2 (two) times daily. Bensimhon, Toribio SAUNDERS, MD  Active Self, Pharmacy Records  Semaglutide , 1 MG/DOSE, 4 MG/3ML SOPN [id number]  Inject 1 mg as directed once a week. Oley Bascom RAMAN, NP  Active   sertraline  (ZOLOFT ) 100 MG tablet [id number]  Take 1.5 tablets (150 mg total) by mouth daily. Bensimhon, Toribio SAUNDERS, MD  Active   spironolactone  (ALDACTONE ) 25 MG tablet [id number]  Take 1 tablet (25 mg total) by mouth daily. Bensimhon, Toribio SAUNDERS, MD  Active   torsemide  (DEMADEX ) 20 MG tablet [id number]  Take 1 tablet (20 mg total) by mouth as needed. Bensimhon, Toribio SAUNDERS, MD  Active   traMADol  HCl 100 MG TABS [id number]  Take 1 tablet (100 mg) by mouth every 6 (six) hours as needed for pain  Patient taking differently: Take 100 mg by mouth as needed.   Bensimhon, Toribio SAUNDERS, MD  Active   traZODone  (DESYREL ) 100 MG tablet [id number]  Take 1 tablet (100 mg total) by mouth at bedtime. Bensimhon, Daniel R, MD  Active   warfarin (COUMADIN ) 4 MG tablet [id number]  Take 12 mg (3 tabs) every Tuesday and 8 mg (2 tabs) all other days or as directed by HF clinic Bensimhon, Toribio SAUNDERS, MD  Active             Recommendation:   Continue Current Plan of Care  Follow Up Plan:   Telephone follow-up in 1 month  Santana Stamp BSN, CCM [place]  [facility] Population Health RN Care Manager Direct Dial: [phone]  Fax: [phone]\

## 2024-02-08 NOTE — Patient Instructions (Signed)
 Visit Information  Thank you for taking time to visit with me today. Please don't hesitate to contact me if I can be of assistance to you before our next scheduled appointment.  Your next care management appointment is by telephone on June 6 at 1   Please call the care guide team at [phone] if you need to cancel, schedule, or reschedule an appointment.   Please  if you are experiencing a Mental Health or Behavioral Health Crisis or need someone to talk to.  Jurline Olmsted BSN, CCM [place]  [facility] Population Health RN Care Manager Direct Dial: [phone]  Fax: [phone]

## 2024-02-16 ENCOUNTER — Other Ambulatory Visit (HOSPITAL_COMMUNITY): Payer: Self-pay | Admitting: Internal Medicine

## 2024-02-16 DIAGNOSIS — E1122 Type 2 diabetes mellitus with diabetic chronic kidney disease: Secondary | ICD-10-CM

## 2024-02-16 DIAGNOSIS — I5022 Chronic systolic (congestive) heart failure: Secondary | ICD-10-CM

## 2024-02-16 DIAGNOSIS — Z95811 Presence of heart assist device: Secondary | ICD-10-CM

## 2024-02-16 DIAGNOSIS — M1A9XX0 Chronic gout, unspecified, without tophus (tophi): Secondary | ICD-10-CM

## 2024-02-17 ENCOUNTER — Other Ambulatory Visit (HOSPITAL_COMMUNITY): Payer: Self-pay

## 2024-02-17 ENCOUNTER — Other Ambulatory Visit: Payer: Self-pay

## 2024-02-17 MED ORDER — ROSUVASTATIN CALCIUM 10 MG PO TABS
10.0000 mg | ORAL_TABLET | Freq: Every day | ORAL | 3 refills | Status: AC
  Filled 2024-02-17: qty 90, 90d supply, fill #0
  Filled 2024-05-22: qty 90, 90d supply, fill #1
  Filled 2024-08-02: qty 90, 90d supply, fill #2

## 2024-02-17 MED ORDER — COLCHICINE 0.6 MG PO TABS
0.6000 mg | ORAL_TABLET | Freq: Every day | ORAL | 6 refills | Status: DC
  Filled 2024-02-17: qty 30, 30d supply, fill #0
  Filled 2024-03-17: qty 30, 30d supply, fill #1
  Filled 2024-04-19 – 2024-04-21 (×2): qty 30, 30d supply, fill #2
  Filled 2024-05-22: qty 30, 30d supply, fill #3
  Filled 2024-08-02: qty 30, 30d supply, fill #4
  Filled 2024-09-07: qty 30, 30d supply, fill #5
  Filled 2024-10-09: qty 30, 30d supply, fill #6

## 2024-02-18 ENCOUNTER — Other Ambulatory Visit: Payer: Self-pay

## 2024-02-21 ENCOUNTER — Other Ambulatory Visit (HOSPITAL_COMMUNITY): Payer: Self-pay

## 2024-02-29 ENCOUNTER — Other Ambulatory Visit: Payer: Self-pay

## 2024-02-29 DIAGNOSIS — E1122 Type 2 diabetes mellitus with diabetic chronic kidney disease: Secondary | ICD-10-CM

## 2024-02-29 MED ORDER — SEMAGLUTIDE (2 MG/DOSE) 8 MG/3ML ~~LOC~~ SOPN
2.0000 mg | PEN_INJECTOR | SUBCUTANEOUS | 5 refills | Status: DC
  Filled 2024-02-29: qty 3, 28d supply, fill #0
  Filled 2024-04-19 – 2024-05-01 (×5): qty 3, 28d supply, fill #1
  Filled 2024-06-06: qty 3, 28d supply, fill #2
  Filled 2024-06-28 – 2024-07-14 (×2): qty 3, 28d supply, fill #3
  Filled 2024-08-02 – 2024-08-09 (×3): qty 3, 28d supply, fill #4
  Filled 2024-09-07: qty 3, 28d supply, fill #5

## 2024-02-29 NOTE — Progress Notes (Signed)
 02/29/2024 Name: Jeffrey [name] MRN: [id number] DOB: [birth year]  Chief Complaint  Patient presents with   Diabetes   Obesity    Jeffrey [name] is a 48 y.o. year old male who presented for a telephone visit.   They were referred to the pharmacist by their PCP for assistance in managing diabetes.   PMH significant for systolic heart failure due to NICM s/p LVAD, T2DM, obesity, CKD IIIa, R renal cell carcinoma s/p nephrectomy  Subjective: Patient reports he has been doing well and denies any concerns today.  Care Team: Primary Care Provider: Jerrlyn Morel, NP ; Next Scheduled Visit: 04/10/24 Cardiologist: Dr. Bensimhon; Next Scheduled Visit: 03/03/24  Medication Access/Adherence  Current Pharmacy:  [facility] - [facility] [address], [address] [place] [address] Phone: [phone] Fax: [phone]  [id number] - [place], [place] - [address] AT [facility] [address] [place] [address] Phone: [phone] Fax: [phone]   Patient reports affordability concerns with their medications: No  Patient reports access/transportation concerns to their pharmacy: No  Patient reports adherence concerns with their medications:  No     Diabetes:  Current medications: Ozempic  1 mg weekly, metformin  500 mg BID, Jardiance  10 mg daily  Patient reports he is tolerating Ozempic  1 mg well and has completed 4 doses. Denies GI side effects and has noticed his appetite decreasing this last dose increase. Has not weighed himself in the last 5 days as he wants to wait to be able to see his progress. Denies s/sx of fluid overload.  Using glucometer; testing once daily Fasting BG: 85-100  Patient denies hypoglycemic s/sx including dizziness, shakiness, sweating. Patient denies hyperglycemic symptoms including polyuria, polydipsia, polyphagia, nocturia, neuropathy, blurred vision.  Current meal  patterns:  - feels his appetite is getting smaller - Breakfast: blueberries, raspberries, apple, eggs - Supper: meat and vegetable - Snacks: has not been snacking as much - Drinks: mainly water , occasionally hot green tea (no sugar)  Current physical activity: has not been doing any physical activity since last telephone visit as his work out partner changed his work schedule and hasn't been able to walk with him as much. He also notes he is trying to motivate himself to do more. He says his kids will be out of school soon and his plan is to make exercise a family activity so they are all able to be more active.  Objective:  Lab Results  Component Value Date   HGBA1C 5.5 01/05/2024    Lab Results  Component Value Date   CREATININE 1.54 (H) 01/20/2024   BUN 21 (H) 01/20/2024   NA 138 01/20/2024   K 4.1 01/20/2024   CL 113 (H) 01/20/2024   CO2 20 (L) 01/20/2024    Lab Results  Component Value Date   CHOL 123 03/18/2023   HDL 46 03/18/2023   LDLCALC 53 03/18/2023   TRIG 138 03/18/2023   CHOLHDL 2.7 03/18/2023    Medications Reviewed Today     Reviewed by Philmore Bream, RPH (Pharmacist) on 02/29/24 at 1905  Med List Status: <None>   Medication Order Taking? Sig Documenting Provider Last Dose Status Informant  acetaminophen  (TYLENOL ) 325 MG tablet [id number] No Take 2 tablets (650 mg total) by mouth every 6 (six) hours as needed for fever. Remigio Carl, NP Taking Active Self, Pharmacy Records           Med Note [facility] [place]  Thu Mar 18, 2023  2:30 PM) PRN   allopurinol  (ZYLOPRIM ) 100 MG tablet [id number] No Take 1 tablet (100 mg total) by mouth daily. Bensimhon, Rheta Celestine, MD Taking Active   amiodarone  (PACERONE ) 200 MG tablet [id number] No Take 1 tablet (200 mg total) by mouth daily. Bensimhon, Rheta Celestine, MD Taking Active   amLODipine  (NORVASC ) 10 MG tablet [id number] No Take 1 tablet (10 mg total) by mouth daily Nichols, Tonya S, NP Taking Active   Blood  Pressure KIT [id number] No 1 Units by Does not apply route daily. Jerrlyn Morel, NP Taking Active   cefadroxil  (DURICEF) 500 MG capsule [id number] No Take 1 capsule (500 mg total) by mouth 2 (two) times daily. Bensimhon, Rheta Celestine, MD Taking Active   colchicine  0.6 MG tablet [id number]  Take 1 tablet (0.6 mg total) by mouth once daily. Bensimhon, Rheta Celestine, MD  Active   cyanocobalamin  (VITAMIN B12) 1000 MCG tablet [id number] No Take 1 tablet (1,000 mcg total) by mouth daily. Jerrlyn Morel, NP Taking Active   cyclobenzaprine  (FLEXERIL ) 5 MG tablet [id number] No Take 1 tablet (5 mg total) by mouth 3 (three) times daily as needed for muscle spasms  Taking Active   doxazosin  (CARDURA ) 4 MG tablet [id number] No Take 2 tablets (8 mg total) by mouth 2 (two) times daily. Bensimhon, Rheta Celestine, MD Taking Active   empagliflozin  (JARDIANCE ) 10 MG TABS tablet [id number] No Take 1 tablet (10 mg total) by mouth daily before breakfast. Bensimhon, Rheta Celestine, MD Taking Active   ferrous sulfate  (FEROSUL) 325 (65 FE) MG tablet [id number] No Take 1 tablet (325 mg total) by mouth daily before breakfast. Jerrlyn Morel, NP Taking Active   folic acid  (FOLVITE ) 800 MCG tablet [id number] No Take 0.5 tablets (400 mcg total) by mouth daily. Jerrlyn Morel, NP Taking Active   gabapentin  (NEURONTIN ) 400 MG capsule [id number] No Take 1 capsule (400 mg total) by mouth 3 (three) times daily. Bensimhon, Rheta Celestine, MD Taking Active   glucose blood test strip [id number] No Use as directed 4 times daily. Filbert Huff I, NP Taking Active Self, Pharmacy Records  hydrocortisone  (ANUSOL -HC) 25 MG suppository [id number] No Place 1 suppository (25 mg total) rectally at bedtime. Tory Freiberg, NP Taking Active   isosorbide -hydrALAZINE  (BIDIL ) 20-37.5 MG tablet [id number] No Take 2 tablets by mouth 3 (three) times daily. Bensimhon, Rheta Celestine, MD Taking Active   meclizine  (ANTIVERT ) 25 MG tablet [id number] No Take 1 tablet (25 mg total) by  mouth 3 (three) times daily as needed for dizziness. Bensimhon, Rheta Celestine, MD Taking Active Self, Pharmacy Records           Med Note [place], Marthe Slain May 05, 2023  9:08 AM) More dizziness/vertigo lately  metFORMIN  (GLUCOPHAGE ) 500 MG tablet [id number] No Take 1 tablet (500 mg total) by mouth 2 (two) times daily with a meal. Jerrlyn Morel, NP Taking Active   ondansetron  (ZOFRAN -ODT) 4 MG disintegrating tablet [id number] No Take 1 tablet (4 mg total) by mouth every 8 (eight) hours as needed for nausea or vomiting. Lauralee Poll, MD Taking Active   pantoprazole  (PROTONIX ) 40 MG tablet [id number] No Take 1 tablet (40 mg total) by mouth once daily. Bensimhon, Rheta Celestine, MD Taking Active   rosuvastatin  (CRESTOR ) 10 MG tablet [id number]  Take 1 tablet (10 mg total) by mouth daily. Bensimhon, Rheta Celestine, MD  Active   sacubitril -valsartan  (ENTRESTO ) 97-103 MG [id number] No Take 1 tablet by mouth 2 (  two) times daily. Bensimhon, Rheta Celestine, MD Taking Active Self, Pharmacy Records  sertraline  (ZOLOFT ) 100 MG tablet [id number] No Take 1.5 tablets (150 mg total) by mouth daily. Bensimhon, Rheta Celestine, MD Taking Active   spironolactone  (ALDACTONE ) 25 MG tablet [id number] No Take 1 tablet (25 mg total) by mouth daily. Bensimhon, Rheta Celestine, MD Taking Active   torsemide  (DEMADEX ) 20 MG tablet [id number] No Take 1 tablet (20 mg total) by mouth as needed. Bensimhon, Rheta Celestine, MD Taking Active   traMADol  HCl 100 MG TABS [id number] No Take 1 tablet (100 mg) by mouth every 6 (six) hours as needed for pain  Patient taking differently: Take 100 mg by mouth as needed.   Bensimhon, Rheta Celestine, MD Taking Active   traZODone  (DESYREL ) 100 MG tablet [id number] No Take 1 tablet (100 mg total) by mouth at bedtime. Bensimhon, Rheta Celestine, MD Taking Active   warfarin (COUMADIN ) 4 MG tablet [id number] No Take 12 mg (3 tabs) every Tuesday and 8 mg (2 tabs) all other days or as directed by HF clinic Bensimhon, Rheta Celestine, MD Taking Active                Assessment/Plan:   Diabetes: - Currently controlled with last A1C 5.5%, below goal <7%. His goal is to lose weight to be eligible for heart transplant - needs BMI ~35, but current BMI is 45. He feels that he has lost a small amount of weight, but has not weighed himself recently. Although he has noticed a bigger decrease in appetite with the most recent Ozempic  dose increase. Discussed the importance of lifestyle along with medication to help with weight loss. Encouraged him in plan to be more active with his kids. Appropriate to increase Ozempic  to 2 mg weekly for maximum weight loss benefit as he is tolerating this well. May need to evaluate coverage for [employer] in the future. - Reviewed long term cardiovascular and renal outcomes of uncontrolled blood sugar - Reviewed goal A1c, goal fasting, and goal 2 hour post prandial glucose - Reviewed dietary modifications including continuing to limit his portion sizes - Reviewed lifestyle modifications including: encouraged him to increase physical activity with the goal of walking for 60 minutes every day as he was previously - Recommend to increase Ozempic  to 2 mg weekly - Recommend to continue metformin  500 mg BID and Jardiance  10 mg daily - Patient denies personal or family history of multiple endocrine neoplasia type 2, medullary thyroid  cancer; personal history of pancreatitis or gallbladder disease. - Recommend to check fasting blood glucose daily   Follow Up Plan: PharmD on 04/24/24  Jeffrey [name], PharmD PGY-1 Pharmacy Resident

## 2024-03-01 ENCOUNTER — Other Ambulatory Visit (HOSPITAL_COMMUNITY): Payer: Self-pay | Admitting: *Deleted

## 2024-03-01 ENCOUNTER — Ambulatory Visit: Payer: Medicaid Other

## 2024-03-01 ENCOUNTER — Other Ambulatory Visit: Payer: Self-pay

## 2024-03-01 DIAGNOSIS — Z95811 Presence of heart assist device: Secondary | ICD-10-CM

## 2024-03-01 DIAGNOSIS — I420 Dilated cardiomyopathy: Secondary | ICD-10-CM | POA: Diagnosis not present

## 2024-03-01 DIAGNOSIS — Z7901 Long term (current) use of anticoagulants: Secondary | ICD-10-CM

## 2024-03-01 DIAGNOSIS — I5022 Chronic systolic (congestive) heart failure: Secondary | ICD-10-CM

## 2024-03-02 LAB — CUP PACEART REMOTE DEVICE CHECK
Battery Remaining Percentage: 83 %
Date Time Interrogation Session: 20250528110500
HighPow Impedance: 65 Ohm
Implantable Lead Connection Status: 753985
Implantable Lead Implant Date: 20160812
Implantable Lead Location: 753862
Implantable Lead Model: 3401
Implantable Pulse Generator Implant Date: 20210812
Pulse Gen Serial Number: 139514

## 2024-03-03 ENCOUNTER — Encounter (HOSPITAL_COMMUNITY): Admitting: Internal Medicine

## 2024-03-05 ENCOUNTER — Ambulatory Visit: Payer: Self-pay | Admitting: Cardiology

## 2024-03-08 ENCOUNTER — Other Ambulatory Visit: Payer: Self-pay

## 2024-03-08 NOTE — Patient Outreach (Signed)
 Complex Care Management   Visit Note    Name:  Jeffrey [name] MRN: [id number] DOB: [birth year]  Situation: Referral received for Complex Care Management related to Heart Failure I obtained verbal consent from Patient.  Visit completed with Jeffrey [name]  on the phone  Background:   Past Medical History:  Diagnosis Date   Anxiety    Aspirin  allergy    Childhood asthma    Chronic systolic CHF (congestive heart failure) (HCC)    a. EF 20-25% in 2012. b. EF 45-50% in 10/2011 with nonischemic nuc - presumed NICM. c. 12/2014 Echo: Sev depressed LV fxn, sev dil LV, mild LVH, mild MR, sev dil LA, mildly reduced RV fxn.   CKD (chronic kidney disease) stage 2, GFR 60-89 ml/min    Depression    H/O vasectomy 12/2019   High cholesterol    Hypertension    Morbid obesity (HCC)    Nephrolithiasis    OSA on CPAP    Paroxysmal atrial fibrillation (HCC)    Presumed NICM    a. 04/2014 Myoview : EF 26%, glob HK, sev glob HK, ? prior infarct;  b. Never cathed 2/2 CKD.   Renal cell carcinoma (HCC)    a. s/p Rt robotic assisted partial converted to radical nephrectomy on 01/2013.   Troponin level elevated    a. 04/2014, 12/2014: felt due to CHF.   Type II diabetes mellitus (HCC)    Ventricular tachycardia (HCC)    a. appropriate ICD therapy 12/2017    Assessment: Patient Reported Symptoms:  Cognitive Cognitive Status: Alert and oriented to person, place, and time, Normal speech and language skills      Neurological Neurological Review of Symptoms: No symptoms reported    HEENT HEENT Symptoms Reported: No symptoms reported      Cardiovascular Cardiovascular Symptoms Reported: No symptoms reported (Denies acute symptoms today, has LVAD implanted, no concerns.  He is preparing for heart transplant list, MD has instructed him to lose weight to reach BMI of less than 35.) Cardiovascular Conditions: High blood cholesterol, Hypertension, Heart failure, Dysrhythmia Cardiovascular Management Strategies: Medical  device  Respiratory Respiratory Symptoms Reported: No symptoms reported    Endocrine Patient reports the following symptoms related to hypoglycemia or hyperglycemia : No symptoms reported (Reports recent blood sugars have been within normal limits, FSB not under 80 and not over 100.)    Gastrointestinal Gastrointestinal Symptoms Reported: No symptoms reported      Genitourinary Genitourinary Symptoms Reported: No symptoms reported    Integumentary Integumentary Symptoms Reported: No symptoms reported    Musculoskeletal Musculoskelatal Symptoms Reviewed: No symptoms reported   Falls in the past year?: No    Psychosocial              03/08/2024    1:25 PM  Depression screen PHQ 2/9  Decreased Interest 1  Down, Depressed, Hopeless 1  PHQ - 2 Score 2  Altered sleeping 1  Tired, decreased energy 1  Change in appetite 1  Feeling bad or failure about yourself  1  Trouble concentrating 1  Moving slowly or fidgety/restless 0  Suicidal thoughts 0  PHQ-9 Score 7    There were no vitals filed for this visit.  Medications Reviewed Today     Reviewed by Isadore Marble, RN (Registered Nurse) on 02/08/24 at 1158  Med List Status: <None>   Medication Order Taking? Sig Documenting Provider Last Dose Status Informant  acetaminophen  (TYLENOL ) 325 MG tablet [id number]  Take 2 tablets (650 mg total)  by mouth every 6 (six) hours as needed for fever. Remigio Carl, NP  Active Self, Pharmacy Records           Med Note [facility], [facility]   Thu Mar 18, 2023  2:30 PM) PRN   allopurinol  (ZYLOPRIM ) 100 MG tablet [id number]  Take 1 tablet (100 mg total) by mouth daily. Bensimhon, Rheta Celestine, MD  Active   amiodarone  (PACERONE ) 200 MG tablet [id number]  Take 1 tablet (200 mg total) by mouth daily. Bensimhon, Rheta Celestine, MD  Active   amLODipine  (NORVASC ) 10 MG tablet [id number]  Take 1 tablet (10 mg total) by mouth daily Nichols, Tonya S, NP  Active   Blood Pressure KIT [id number]  1 Units by Does  not apply route daily. Jerrlyn Morel, NP  Active   cefadroxil  (DURICEF) 500 MG capsule [id number]  Take 1 capsule (500 mg total) by mouth 2 (two) times daily. Bensimhon, Rheta Celestine, MD  Active   colchicine  0.6 MG tablet [id number]  Take 1 tablet (0.6 mg total) by mouth once daily. Bensimhon, Rheta Celestine, MD  Active   cyanocobalamin  (VITAMIN B12) 1000 MCG tablet [id number]  Take 1 tablet (1,000 mcg total) by mouth daily. Jerrlyn Morel, NP  Active   cyclobenzaprine  (FLEXERIL ) 5 MG tablet [id number]  Take 1 tablet (5 mg total) by mouth 3 (three) times daily as needed for muscle spasms   Active   doxazosin  (CARDURA ) 4 MG tablet [id number]  Take 2 tablets (8 mg total) by mouth 2 (two) times daily. Bensimhon, Rheta Celestine, MD  Active   empagliflozin  (JARDIANCE ) 10 MG TABS tablet [id number]  Take 1 tablet (10 mg total) by mouth daily before breakfast. Bensimhon, Rheta Celestine, MD  Active   ferrous sulfate  (FEROSUL) 325 (65 FE) MG tablet [id number]  Take 1 tablet (325 mg total) by mouth daily before breakfast. Nichols, Tonya S, NP  Active   folic acid  (FOLVITE ) 800 MCG tablet [id number]  Take 0.5 tablets (400 mcg total) by mouth daily. Jerrlyn Morel, NP  Active   gabapentin  (NEURONTIN ) 400 MG capsule [id number]  Take 1 capsule (400 mg total) by mouth 3 (three) times daily. Bensimhon, Rheta Celestine, MD  Active   glucose blood test strip [id number]  Use as directed 4 times daily. Filbert Huff I, NP  Active Self, Pharmacy Records  hydrocortisone  (ANUSOL -HC) 25 MG suppository [id number]  Place 1 suppository (25 mg total) rectally at bedtime. Kennedy-Smith, Colleen M, NP  Active   isosorbide -hydrALAZINE  (BIDIL ) 20-37.5 MG tablet [id number]  Take 2 tablets by mouth 3 (three) times daily. Bensimhon, Rheta Celestine, MD  Active   meclizine  (ANTIVERT ) 25 MG tablet [id number]  Take 1 tablet (25 mg total) by mouth 3 (three) times daily as needed for dizziness. Bensimhon, Rheta Celestine, MD  Active Self, Pharmacy Records           Med Note [place],  Marthe Slain May 05, 2023  9:08 AM) More dizziness/vertigo lately  metFORMIN  (GLUCOPHAGE ) 500 MG tablet [id number]  Take 1 tablet (500 mg total) by mouth 2 (two) times daily with a meal. Jerrlyn Morel, NP  Active   ondansetron  (ZOFRAN -ODT) 4 MG disintegrating tablet [id number] Yes Take 1 tablet (4 mg total) by mouth every 8 (eight) hours as needed for nausea or vomiting. Lauralee Poll, MD Taking Active   pantoprazole  (PROTONIX ) 40 MG tablet [id number] Yes Take 1 tablet (40 mg total) by mouth once daily. Bensimhon, Rheta Celestine, MD Taking Active   rosuvastatin  (  CRESTOR ) 10 MG tablet [id number]  Take 1 tablet (10 mg total) by mouth daily. Bensimhon, Rheta Celestine, MD  Active   sacubitril -valsartan  (ENTRESTO ) 97-103 MG [id number]  Take 1 tablet by mouth 2 (two) times daily. Bensimhon, Rheta Celestine, MD  Active Self, Pharmacy Records  Semaglutide , 1 MG/DOSE, 4 MG/3ML SOPN [id number]  Inject 1 mg as directed once a week. Jerrlyn Morel, NP  Active   sertraline  (ZOLOFT ) 100 MG tablet [id number]  Take 1.5 tablets (150 mg total) by mouth daily. Bensimhon, Rheta Celestine, MD  Active   spironolactone  (ALDACTONE ) 25 MG tablet [id number]  Take 1 tablet (25 mg total) by mouth daily. Bensimhon, Rheta Celestine, MD  Active   torsemide  (DEMADEX ) 20 MG tablet [id number]  Take 1 tablet (20 mg total) by mouth as needed. Bensimhon, Rheta Celestine, MD  Active   traMADol  HCl 100 MG TABS [id number]  Take 1 tablet (100 mg) by mouth every 6 (six) hours as needed for pain  Patient taking differently: Take 100 mg by mouth as needed.   Bensimhon, Rheta Celestine, MD  Active   traZODone  (DESYREL ) 100 MG tablet [id number]  Take 1 tablet (100 mg total) by mouth at bedtime. Bensimhon, Daniel R, MD  Active   warfarin (COUMADIN ) 4 MG tablet [id number]  Take 12 mg (3 tabs) every Tuesday and 8 mg (2 tabs) all other days or as directed by HF clinic Bensimhon, Rheta Celestine, MD  Active             Recommendation:   Take Ozempic  as directed to help with weight loss, follow  activity plan, follow meal plan. LAST WEIGHT recorded at [facility] on 01/27/24 was 303lbs, down from 305lbs on 01/05/24.   Follow Up Plan:   Telephone follow-up two weeks  Jurline Olmsted BSN, CCM [place]  [facility] Population Health RN Care Manager Direct Dial: [phone]  Fax: [phone]

## 2024-03-09 ENCOUNTER — Ambulatory Visit (HOSPITAL_COMMUNITY)
Admission: RE | Admit: 2024-03-09 | Discharge: 2024-03-09 | Disposition: A | Discharge status: Home / Self Care | Source: Ambulatory Visit | Attending: Cardiology | Admitting: Cardiology

## 2024-03-09 ENCOUNTER — Ambulatory Visit (HOSPITAL_COMMUNITY): Payer: Self-pay | Admitting: Pharmacist

## 2024-03-09 ENCOUNTER — Other Ambulatory Visit: Payer: Self-pay

## 2024-03-09 VITALS — BP 102/0 | HR 73 | Wt 293.0 lb

## 2024-03-09 DIAGNOSIS — F419 Anxiety disorder, unspecified: Secondary | ICD-10-CM | POA: Insufficient documentation

## 2024-03-09 DIAGNOSIS — M109 Gout, unspecified: Secondary | ICD-10-CM | POA: Diagnosis not present

## 2024-03-09 DIAGNOSIS — I428 Other cardiomyopathies: Secondary | ICD-10-CM | POA: Insufficient documentation

## 2024-03-09 DIAGNOSIS — Z7984 Long term (current) use of oral hypoglycemic drugs: Secondary | ICD-10-CM | POA: Insufficient documentation

## 2024-03-09 DIAGNOSIS — E1122 Type 2 diabetes mellitus with diabetic chronic kidney disease: Secondary | ICD-10-CM | POA: Insufficient documentation

## 2024-03-09 DIAGNOSIS — I5084 End stage heart failure: Secondary | ICD-10-CM | POA: Insufficient documentation

## 2024-03-09 DIAGNOSIS — I480 Paroxysmal atrial fibrillation: Secondary | ICD-10-CM | POA: Diagnosis not present

## 2024-03-09 DIAGNOSIS — G4733 Obstructive sleep apnea (adult) (pediatric): Secondary | ICD-10-CM | POA: Insufficient documentation

## 2024-03-09 DIAGNOSIS — F32A Depression, unspecified: Secondary | ICD-10-CM | POA: Diagnosis not present

## 2024-03-09 DIAGNOSIS — Z79899 Other long term (current) drug therapy: Secondary | ICD-10-CM | POA: Insufficient documentation

## 2024-03-09 DIAGNOSIS — Z7901 Long term (current) use of anticoagulants: Secondary | ICD-10-CM | POA: Diagnosis not present

## 2024-03-09 DIAGNOSIS — Z6841 Body Mass Index (BMI) 40.0 and over, adult: Secondary | ICD-10-CM | POA: Insufficient documentation

## 2024-03-09 DIAGNOSIS — Z95811 Presence of heart assist device: Secondary | ICD-10-CM | POA: Diagnosis not present

## 2024-03-09 DIAGNOSIS — I130 Hypertensive heart and chronic kidney disease with heart failure and stage 1 through stage 4 chronic kidney disease, or unspecified chronic kidney disease: Secondary | ICD-10-CM | POA: Insufficient documentation

## 2024-03-09 DIAGNOSIS — N1832 Chronic kidney disease, stage 3b: Secondary | ICD-10-CM | POA: Diagnosis not present

## 2024-03-09 DIAGNOSIS — I5022 Chronic systolic (congestive) heart failure: Secondary | ICD-10-CM | POA: Insufficient documentation

## 2024-03-09 DIAGNOSIS — Z7985 Long-term (current) use of injectable non-insulin antidiabetic drugs: Secondary | ICD-10-CM | POA: Diagnosis not present

## 2024-03-09 LAB — BASIC METABOLIC PANEL WITH GFR
Anion gap: 7 (ref 5–15)
BUN: 25 mg/dL — ABNORMAL HIGH (ref 6–20)
CO2: 22 mmol/L (ref 22–32)
Calcium: 10 mg/dL (ref 8.9–10.3)
Chloride: 109 mmol/L (ref 98–111)
Creatinine, Ser: 1.9 mg/dL — ABNORMAL HIGH (ref 0.61–1.24)
GFR, Estimated: 43 mL/min — ABNORMAL LOW (ref >60)
Glucose, Bld: 107 mg/dL — ABNORMAL HIGH (ref 70–99)
Potassium: 4 mmol/L (ref 3.5–5.1)
Sodium: 138 mmol/L (ref 135–145)

## 2024-03-09 LAB — CBC
HCT: 47.9 % (ref 39.0–52.0)
Hemoglobin: 15.7 g/dL (ref 13.0–17.0)
MCH: 27.5 pg (ref 26.0–34.0)
MCHC: 32.8 g/dL (ref 30.0–36.0)
MCV: 84 fL (ref 80.0–100.0)
Platelets: 166 10*3/uL (ref 150–400)
RBC: 5.7 MIL/uL (ref 4.22–5.81)
RDW: 18.4 % — ABNORMAL HIGH (ref 11.5–15.5)
WBC: 6.5 10*3/uL (ref 4.0–10.5)
nRBC: 0 % (ref 0.0–0.2)

## 2024-03-09 LAB — PROTIME-INR
INR: 1.7 — ABNORMAL HIGH (ref 0.8–1.2)
Prothrombin Time: 19.7 s — ABNORMAL HIGH (ref 11.4–15.2)

## 2024-03-09 LAB — LACTATE DEHYDROGENASE: LDH: 223 U/L — ABNORMAL HIGH (ref 98–192)

## 2024-03-09 MED ORDER — CARVEDILOL 3.125 MG PO TABS
3.1250 mg | ORAL_TABLET | Freq: Two times a day (BID) | ORAL | 3 refills | Status: DC
  Filled 2024-03-09: qty 90, 45d supply, fill #0
  Filled 2024-04-19 – 2024-04-21 (×2): qty 90, 45d supply, fill #1
  Filled 2024-06-23: qty 90, 45d supply, fill #2

## 2024-03-09 NOTE — Progress Notes (Signed)
 VAD CLINIC Follow-up Visit  PCP: Jerrlyn Morel, NP HF MD: DB   HPI:  Jeffrey [name] is a 48 y.o. yo male with a history of  poorly controlled HTN, R renal cell carcinoma s/p nephrectomy, CKD IIIa, DM2, OSA, gout, morbid obesity and systolic HF due to NICM. 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 D/c'd home with plan for 6 weeks IV Ancef  with plan to transition to cefadroxil  for long-term suppression on 09/28/22.   Here for follow up. Continues to have some issues with his mood, some days he is up, some days he feels like he has no interest in activities or interacting with others. Discussed pursuing psychiatric follow up and in agreement. BP continues to be elevated. No edema or swelling. No melena or BRBPR, no LVAD issues. Continues to take his antibiotics for suppression, no change in his driveline. He is down 14 pounds since last visit.    VAD Indication: Destination Therapy currently due to smoking and BMI   VAD interrogation & Equipment Management: Speed: 6200 Flow: 5.5 Power: 5.2    PI: 2.4 Hct: 44   Alarms: none Events: 5-10 PI  Fixed speed 6200 Low speed limit: 5900   Primary Controller: Replace back up battery in 23 months Back up controller:  pt did not bring to clinic-instructed to bring next visit   Annual Equipment Maintenance on UBC/PM was performed on 06/17/23.    I reviewed the LVAD parameters from today, and compared the results to the patient's prior recorded data.  No programming changes were made.  The LVAD is functioning within specified parameters.  The patient performs LVAD self-test daily.  LVAD interrogation was negative for any significant power changes, alarms or PI events/speed drops.  LVAD equipment check completed and is in good working order.      Past Medical History:  Diagnosis Date   Anxiety    Aspirin  allergy    Childhood asthma    Chronic systolic  CHF (congestive heart failure) (HCC)    a. EF 20-25% in 2012. b. EF 45-50% in 10/2011 with nonischemic nuc - presumed NICM. c. 12/2014 Echo: Sev depressed LV fxn, sev dil LV, mild LVH, mild MR, sev dil LA, mildly reduced RV fxn.   CKD (chronic kidney disease) stage 2, GFR 60-89 ml/min    Depression    H/O vasectomy 12/2019   High cholesterol    Hypertension    Morbid obesity (HCC)    Nephrolithiasis    OSA on CPAP    Paroxysmal atrial fibrillation (HCC)    Presumed NICM    a. 04/2014 Myoview : EF 26%, glob HK, sev glob HK, ? prior infarct;  b. Never cathed 2/2 CKD.   Renal cell carcinoma (HCC)    a. s/p Rt robotic assisted partial converted to radical nephrectomy on 01/2013.   Troponin level elevated    a. 04/2014, 12/2014: felt due to CHF.   Type II diabetes mellitus (HCC)    Ventricular tachycardia (HCC)    a. appropriate ICD therapy 12/2017    Current Outpatient Medications  Medication Sig Dispense Refill   acetaminophen  (TYLENOL ) 325 MG tablet Take 2 tablets (650 mg total) by mouth every 6 (six) hours as needed for fever.     allopurinol  (ZYLOPRIM ) 100 MG tablet Take 1 tablet (100 mg total) by mouth daily. 90 tablet 3   amiodarone  (PACERONE ) 200 MG tablet Take  1 tablet (200 mg total) by mouth daily. 30 tablet 11   amLODipine  (NORVASC ) 10 MG tablet Take 1 tablet (10 mg total) by mouth daily 30 tablet 6   Blood Pressure KIT 1 Units by Does not apply route daily. 1 kit 0   carvedilol  (COREG ) 3.125 MG tablet Take 1 tablet (3.125 mg total) by mouth 2 (two) times daily with a meal. 90 tablet 3   cefadroxil  (DURICEF) 500 MG capsule Take 1 capsule (500 mg total) by mouth 2 (two) times daily. 180 capsule 3   colchicine  0.6 MG tablet Take 1 tablet (0.6 mg total) by mouth once daily. 30 tablet 6   cyanocobalamin  (VITAMIN B12) 1000 MCG tablet Take 1 tablet (1,000 mcg total) by mouth daily. 30 tablet 0   cyclobenzaprine  (FLEXERIL ) 5 MG tablet Take 1 tablet (5 mg total) by mouth 3 (three) times daily  as needed for muscle spasms 15 tablet 0   doxazosin  (CARDURA ) 4 MG tablet Take 2 tablets (8 mg total) by mouth 2 (two) times daily. 120 tablet 6   empagliflozin  (JARDIANCE ) 10 MG TABS tablet Take 1 tablet (10 mg total) by mouth daily before breakfast. 30 tablet 11   ferrous sulfate  (FEROSUL) 325 (65 FE) MG tablet Take 1 tablet (325 mg total) by mouth daily before breakfast. 90 tablet 1   folic acid  (FOLVITE ) 800 MCG tablet Take 0.5 tablets (400 mcg total) by mouth daily. 30 tablet 2   gabapentin  (NEURONTIN ) 400 MG capsule Take 1 capsule (400 mg total) by mouth 3 (three) times daily. 30 capsule 6   glucose blood test strip Use as directed 4 times daily. 100 each 11   isosorbide -hydrALAZINE  (BIDIL ) 20-37.5 MG tablet Take 2 tablets by mouth 3 (three) times daily. 180 tablet 6   meclizine  (ANTIVERT ) 25 MG tablet Take 1 tablet (25 mg total) by mouth 3 (three) times daily as needed for dizziness. 90 tablet 2   metFORMIN  (GLUCOPHAGE ) 500 MG tablet Take 1 tablet (500 mg total) by mouth 2 (two) times daily with a meal. 180 tablet 3   ondansetron  (ZOFRAN -ODT) 4 MG disintegrating tablet Take 1 tablet (4 mg total) by mouth every 8 (eight) hours as needed for nausea or vomiting. 5 tablet 0   pantoprazole  (PROTONIX ) 40 MG tablet Take 1 tablet (40 mg total) by mouth once daily. 90 tablet 3   rosuvastatin  (CRESTOR ) 10 MG tablet Take 1 tablet (10 mg total) by mouth daily. 90 tablet 3   sacubitril -valsartan  (ENTRESTO ) 97-103 MG Take 1 tablet by mouth 2 (two) times daily. 60 tablet 6   Semaglutide , 2 MG/DOSE, 8 MG/3ML SOPN Inject 2 mg as directed once a week. 3 mL 5   sertraline  (ZOLOFT ) 100 MG tablet Take 1.5 tablets (150 mg total) by mouth daily. 45 tablet 6   spironolactone  (ALDACTONE ) 25 MG tablet Take 1 tablet (25 mg total) by mouth daily. 60 tablet 6   torsemide  (DEMADEX ) 20 MG tablet Take 1 tablet (20 mg total) by mouth as needed. 30 tablet 0   traMADol  HCl 100 MG TABS Take 1 tablet (100 mg) by mouth every 6  (six) hours as needed for pain (Patient taking differently: Take 100 mg by mouth as needed.) 90 tablet 0   traZODone  (DESYREL ) 100 MG tablet Take 1 tablet (100 mg total) by mouth at bedtime. 30 tablet 11   warfarin (COUMADIN ) 4 MG tablet Take 12 mg (3 tabs) every Tuesday and 8 mg (2 tabs) all other days or as directed by  HF clinic 90 tablet 11   hydrocortisone  (ANUSOL -HC) 25 MG suppository Place 1 suppository (25 mg total) rectally at bedtime. (Patient not taking: Reported on 03/09/2024) 5 suppository 1   No current facility-administered medications for this encounter.    Aspirin , Bee venom, Lisinopril , and Tomato    Vitals:   03/09/24 1501 03/09/24 1502 03/09/24 1503  BP: (!) 124/96 107/83 (!) 102/0  Pulse: 73    SpO2: 95%    Weight: 132.9 kg (293 lb)        Vital Signs:  Doppler Pressure: 102, modified systolic Automatic BP: 124/96 HR: 73 SPO2: 95% on RA   Weight: 293 lb w/ eqt Last weight:  307.8  lbs w/eqt    Exam: General:  NAD.  HEENT: normal  Cor: LVAD hum. JVP flat, pulse not palpable  Lungs: Clear. Abdomen: obese soft, nontender, non-distended. Driveline site clean. Anchor in place.  Extremities: no cyanosis, clubbing, rash. Warm no edema  Neuro: alert & oriented x 3. No focal deficits. Moves all 4 without problem    ASSESSMENT AND PLAN:  .1. Chronic systolic CHF - Nonischemic CM, end-stage - EF 2012; 20-25%  - Echo  8/22: EF < 20%, severe LV dilation, moderate RV dysfunction.   - RHC (8/22): well compensated hemodynamics. - s/p S-ICD - S/p HM3 LVAD 07/21/21 (DT) - Dong great NYHA I  - Euvolemic on exam, will add carvedilol  3.125mg  BID - Otherwise on excellent medical therapy, continue bidil  2 tabs TID, spironolactone  25mg  daily, entresto  97/103mg  BID, amlodipine  10mg  daily - Aware of need to get to BMI ~35 to permit transplant  Body mass index is 43.9 kg/m. Now back on OZempic . Significant weight loss - Echocardiogram at next visit  2. VAD  - s/p  HM-3 placement 07/21/21  -VAD interrogated as above - INR 1.7 Goal INR 2.0-3.0 Discussed warfarin dosing with pharmacy - LDH 223 - DL site looks stable,  Continue cefadroxil  for suppression - Hgb 15.7  3. DL site infection - Resolved - Continue cefadroxil  for suppression, appreciate ID  4 Atrial fibrillation: Paroxysmal - Had severe AF with RVR 7/22 s/p DC-CV x 2 - Seen by EP. Not a candidate for ablation due to body habitus  (could consider AVN ablation and CRT, if needed)  - Had post-op AF with RVR post VAD - 10/23 S/P DC-CV with restoration of NSR. - Remains in NSR today. Continue amio 100 daily - On warfarin  5. HTN - MAPs still elevated, adjustments as above  6. CKD stage 3B  - Has solitary kidney.   - Baseline creatinine is 1.5-1.8.  - Creatinine stable at 1.9   7. OSA - Continue CPAP   8. DM2  - On Jardiance . - Hgb A1c 5.5 on 01/05/24  - PCP following - Continue Crestor  10  9. Polyarticular gout. - quiescent  10. Depression/anxiety - Continue Zoloft  150mg  daily - Referral to psych  11. Morbid obesity - Body mass index is 43.9 kg/m. - Continue Ozempic .  - Improvement in symptoms   I spent a total of 48 minutes today: 1) reviewing the patient's medical records including previous charts, labs and recent notes from other providers; 2) examining the patient and counseling them on their medical issues/explaining the plan of care; 3) adjusting meds as needed and 4) ordering lab work or other needed tests.   Lauralee Poll, MD  6:45 AM

## 2024-03-09 NOTE — Patient Outreach (Signed)
 Complex Care Management   Visit Note  03/09/2024  Name:  Jeffrey [name] MRN: [id number] DOB: [birth year]  Situation: Referral received for Complex Care Management related to Heart Failure I obtained verbal consent from Patient.  Visit completed with patient  on the phone  Background:   Past Medical History:  Diagnosis Date   Anxiety    Aspirin  allergy    Childhood asthma    Chronic systolic CHF (congestive heart failure) (HCC)    a. EF 20-25% in 2012. b. EF 45-50% in 10/2011 with nonischemic nuc - presumed NICM. c. 12/2014 Echo: Sev depressed LV fxn, sev dil LV, mild LVH, mild MR, sev dil LA, mildly reduced RV fxn.   CKD (chronic kidney disease) stage 2, GFR 60-89 ml/min    Depression    H/O vasectomy 12/2019   High cholesterol    Hypertension    Morbid obesity (HCC)    Nephrolithiasis    OSA on CPAP    Paroxysmal atrial fibrillation (HCC)    Presumed NICM    a. 04/2014 Myoview : EF 26%, glob HK, sev glob HK, ? prior infarct;  b. Never cathed 2/2 CKD.   Renal cell carcinoma (HCC)    a. s/p Rt robotic assisted partial converted to radical nephrectomy on 01/2013.   Troponin level elevated    a. 04/2014, 12/2014: felt due to CHF.   Type II diabetes mellitus (HCC)    Ventricular tachycardia (HCC)    a. appropriate ICD therapy 12/2017    Assessment: Patient Reported Symptoms:  Cognitive Cognitive Status: Able to follow simple commands, Alert and oriented to person, place, and time, Normal speech and language skills      Neurological Neurological Review of Symptoms: No symptoms reported    HEENT HEENT Symptoms Reported: No symptoms reported      Cardiovascular Cardiovascular Symptoms Reported: No symptoms reported Cardiovascular Conditions: Heart failure, High blood cholesterol, Hypertension Cardiovascular Management Strategies: Medication therapy  Respiratory Respiratory Symptoms Reported: No symptoms reported    Endocrine Patient reports the following symptoms related to  hypoglycemia or hyperglycemia : No symptoms reported    Gastrointestinal Gastrointestinal Symptoms Reported: No symptoms reported      Genitourinary Genitourinary Symptoms Reported: No symptoms reported    Integumentary Integumentary Symptoms Reported: No symptoms reported    Musculoskeletal Musculoskelatal Symptoms Reviewed: No symptoms reported   Falls in the past year?: No    Psychosocial       Quality of Family Relationships: supportive Do you feel physically threatened by others?: No      03/08/2024    1:25 PM  Depression screen PHQ 2/9  Decreased Interest 1  Down, Depressed, Hopeless 1  PHQ - 2 Score 2  Altered sleeping 1  Tired, decreased energy 1  Change in appetite 1  Feeling bad or failure about yourself  1  Trouble concentrating 1  Moving slowly or fidgety/restless 0  Suicidal thoughts 0  PHQ-9 Score 7    There were no vitals filed for this visit.  Medications Reviewed Today     Reviewed by Augustin Leber, RN (Registered Nurse) on 03/08/24 at 1314  Med List Status: <None>   Medication Order Taking? Sig Documenting Provider Last Dose Status Informant  acetaminophen  (TYLENOL ) 325 MG tablet [id number] Yes Take 2 tablets (650 mg total) by mouth every 6 (six) hours as needed for fever. Remigio Carl, NP Taking Active Self, Pharmacy Records           Med Note [facility], [place]  Thu Mar 18, 2023  2:30 PM) PRN   allopurinol  (ZYLOPRIM ) 100 MG tablet [id number] Yes Take 1 tablet (100 mg total) by mouth daily. Bensimhon, Rheta Celestine, MD Taking Active   amiodarone  (PACERONE ) 200 MG tablet [id number] Yes Take 1 tablet (200 mg total) by mouth daily. Bensimhon, Rheta Celestine, MD Taking Active   amLODipine  (NORVASC ) 10 MG tablet [id number] Yes Take 1 tablet (10 mg total) by mouth daily Nichols, Tonya S, NP Taking Active   Blood Pressure KIT [id number] Yes 1 Units by Does not apply route daily. Jerrlyn Morel, NP Taking Active   cefadroxil  (DURICEF) 500 MG capsule [id number]  Yes Take 1 capsule (500 mg total) by mouth 2 (two) times daily. Bensimhon, Rheta Celestine, MD Taking Active   colchicine  0.6 MG tablet [id number] Yes Take 1 tablet (0.6 mg total) by mouth once daily. Bensimhon, Rheta Celestine, MD Taking Active   cyanocobalamin  (VITAMIN B12) 1000 MCG tablet [id number] Yes Take 1 tablet (1,000 mcg total) by mouth daily. Jerrlyn Morel, NP Taking Active   cyclobenzaprine  (FLEXERIL ) 5 MG tablet [id number] Yes Take 1 tablet (5 mg total) by mouth 3 (three) times daily as needed for muscle spasms  Taking Active   doxazosin  (CARDURA ) 4 MG tablet [id number] Yes Take 2 tablets (8 mg total) by mouth 2 (two) times daily. Bensimhon, Rheta Celestine, MD Taking Active   empagliflozin  (JARDIANCE ) 10 MG TABS tablet [id number] Yes Take 1 tablet (10 mg total) by mouth daily before breakfast. Bensimhon, Rheta Celestine, MD Taking Active   ferrous sulfate  (FEROSUL) 325 (65 FE) MG tablet [id number] Yes Take 1 tablet (325 mg total) by mouth daily before breakfast. Jerrlyn Morel, NP Taking Active   folic acid  (FOLVITE ) 800 MCG tablet [id number] Yes Take 0.5 tablets (400 mcg total) by mouth daily. Jerrlyn Morel, NP Taking Active   gabapentin  (NEURONTIN ) 400 MG capsule [id number] Yes Take 1 capsule (400 mg total) by mouth 3 (three) times daily. Bensimhon, Rheta Celestine, MD Taking Active   glucose blood test strip [id number] Yes Use as directed 4 times daily. Filbert Huff I, NP Taking Active Self, Pharmacy Records  hydrocortisone  (ANUSOL -HC) 25 MG suppository [id number] Yes Place 1 suppository (25 mg total) rectally at bedtime. Kennedy-Smith, Colleen M, NP Taking Active   isosorbide -hydrALAZINE  (BIDIL ) 20-37.5 MG tablet [id number] Yes Take 2 tablets by mouth 3 (three) times daily. Bensimhon, Rheta Celestine, MD Taking Active   meclizine  (ANTIVERT ) 25 MG tablet [id number] Yes Take 1 tablet (25 mg total) by mouth 3 (three) times daily as needed for dizziness. Bensimhon, Rheta Celestine, MD Taking Active Self, Pharmacy Records           Med  Note [place], Marthe Slain May 05, 2023  9:08 AM) More dizziness/vertigo lately  metFORMIN  (GLUCOPHAGE ) 500 MG tablet [id number] Yes Take 1 tablet (500 mg total) by mouth 2 (two) times daily with a meal. Jerrlyn Morel, NP Taking Active   ondansetron  (ZOFRAN -ODT) 4 MG disintegrating tablet [id number] Yes Take 1 tablet (4 mg total) by mouth every 8 (eight) hours as needed for nausea or vomiting. Lauralee Poll, MD Taking Active   pantoprazole  (PROTONIX ) 40 MG tablet [id number] Yes Take 1 tablet (40 mg total) by mouth once daily. Bensimhon, Rheta Celestine, MD Taking Active   rosuvastatin  (CRESTOR ) 10 MG tablet [id number] Yes Take 1 tablet (10 mg total) by mouth daily. Bensimhon, Rheta Celestine, MD Taking Active   sacubitril -valsartan  (ENTRESTO ) 97-103 MG [id number] Yes Take 1 tablet by mouth 2 (  two) times daily. Bensimhon, Rheta Celestine, MD Taking Active Self, Pharmacy Records  Semaglutide , 2 MG/DOSE, 8 MG/3ML SOPN [id number] Yes Inject 2 mg as directed once a week. Jerrlyn Morel, NP Taking Active   sertraline  (ZOLOFT ) 100 MG tablet [id number] Yes Take 1.5 tablets (150 mg total) by mouth daily. Bensimhon, Rheta Celestine, MD Taking Active   spironolactone  (ALDACTONE ) 25 MG tablet [id number] Yes Take 1 tablet (25 mg total) by mouth daily. Bensimhon, Rheta Celestine, MD Taking Active   torsemide  (DEMADEX ) 20 MG tablet [id number] Yes Take 1 tablet (20 mg total) by mouth as needed. Bensimhon, Rheta Celestine, MD Taking Active   traMADol  HCl 100 MG TABS [id number] Yes Take 1 tablet (100 mg) by mouth every 6 (six) hours as needed for pain  Patient taking differently: Take 100 mg by mouth as needed.   Bensimhon, Rheta Celestine, MD Taking Active   traZODone  (DESYREL ) 100 MG tablet [id number] Yes Take 1 tablet (100 mg total) by mouth at bedtime. Bensimhon, Rheta Celestine, MD Taking Active   warfarin (COUMADIN ) 4 MG tablet [id number] Yes Take 12 mg (3 tabs) every Tuesday and 8 mg (2 tabs) all other days or as directed by HF clinic Bensimhon, Rheta Celestine, MD Taking  Active             Recommendation:   PCP Follow-up  Follow Up Plan:   Telephone follow up appointment with care management team member scheduled for:  04/12/24  1 pm  Augustin Leber RN, BSN, [facility] [place]  [facility], [facility]  Care Coordinator Phone: [phone]

## 2024-03-09 NOTE — Patient Instructions (Signed)
 Visit Information  Jeffrey [name] was given information about Medicaid Managed Care team care coordination services as a part of their Healthy [facility] Medicaid benefit. Jeffrey [name] verbally consented to engagement with the [facility] Managed Care team.   If you are experiencing a medical emergency, please call 911 or report to your local emergency department or urgent care.   If you have a non-emergency medical problem during routine business hours, please contact your provider's office and ask to speak with a nurse.   For questions related to your Healthy [facility] health plan, please call: [phone] or visit the homepage here: [email or web address]  If you would like to schedule transportation through your Healthy [facility] plan, please call the following number at least 2 days in advance of your appointment: [phone]  For information about your ride after you set it up, call Ride Assist at [phone]. Use this number to activate a Will Call pickup, or if your transportation is late for a scheduled pickup. Use this number, too, if you need to make a change or cancel a previously scheduled reservation.  If you need transportation services right away, call [phone]. The after-hours call center is staffed 24 hours to handle ride assistance and urgent reservation requests (including discharges) 365 days a year. Urgent trips include sick visits, hospital discharge requests and life-sustaining treatment.  Call the [facility] Line at [phone], at any time, 24 hours a day, 7 days a week. If you are in danger or need immediate medical attention call 911.  If you would like help to quit smoking, call 1-800-QUIT-NOW ((949)517-6013) OR Espaol: 1-855-Djelo-Ya (4-742-595-6387) o para ms informacin haga clic aqu or Text READY to 564-332 to register via text  Jeffrey [name] - following are the goals we discussed in your visit today:   Goals  Addressed             This Visit's Progress    COMPLETED: VBCI RN Care Plan       Problems:  Chronic Disease Management support and education needs related to CHF and Obesity  Goal: Over the next 6 months the Patient will demonstrate a decrease CHF in exacerbations as evidenced by no hospitalizations.  demonstrate Improved adherence to prescribed treatment plan for CHF and Weight Loss as evidenced by weight loss with goal of less than BMI of 35.   Interventions:   Heart Failure Interventions:  (Status:  New goal.) Long Term Goal Reviewed role of diuretics in prevention of fluid overload and management of heart failure; Discussed the importance of keeping all appointments with provider Screening for signs and symptoms of depression related to chronic disease state      Patient Self-Care Activities:  Attend all scheduled provider appointments Call provider office for new concerns or questions  Take medications as prescribed   call the Suicide and Crisis Lifeline: 988 if experiencing a Mental Health or Behavioral Health Crisis follow rescue plan if symptoms flare-up know when to call the doctor:if torsemide  does not alleviate shortness of breath symptoms  Take Ozempic  as directed to help with weight loss, follow activity plan, follow meal plan.  LAST WEIGHT recorded at [facility] MD on 01/27/24 was 303lbs, down from 305lbs on 01/05/24.    Plan:  Telephone follow up appointment with care management team member scheduled for:  Wednesday June 4th at 1:00pm. With Jeffrey [name] [facility]           VBCI RN Care Plan       Problems:  Chronic  Disease Management support and education needs related to CHF  Goal: Over the next 45 days the Patient will attend all scheduled medical appointments: with PCP and specialist as evidenced by keeping al scheduled appointment CHF as evidenced by no admissions to the hospital verbalize basic understanding of CHF disease process and self health management plan  as evidenced by verbal explanation lifestyle changes and consistent medication compliance   Interventions:   "STOP LIGHT"  Heart Failure (HF) Definition: When the heart muscle does not pump enough blood through the heart, leaving it very weak.   Some Heart Failure signs/symptoms:  Shortness of breath with or without activities Weakness Swelling in your legs, ankles, feet, and/or abdomen Sudden weight gain from fluid retention Coughing or wheezing with possible mucus build up  Things you can do: Improve your quality of life by managing your stress levels, reducing the salt in your diet, exercise and lose weight if necessary.     Heart Failure Interventions: Provided education on low sodium diet Advised patient to weigh each morning after emptying bladder Discussed importance of daily weight and advised patient to weigh and record daily- The patient does not weigh daily due to he does not want to get discouraged Reviewed role of diuretics in prevention of fluid overload and management of heart failure; Discussed the importance of keeping all appointments with provider Screening for signs and symptoms of depression related to chronic disease state  Assessed social determinant of health barriers  He continues to take his Ozempic  and has lost down to 278 LBS.  Advised him to continue the good work to reach his goal.  Patient Self-Care Activities:  Attend all scheduled provider appointments Call pharmacy for medication refills 3-7 days in advance of running out of medications Call provider office for new concerns or questions  Perform all self care activities independently  Take medications as prescribed    Plan:  Telephone follow up appointment with care management team member scheduled for:  04/12/24  1 pm             Please see education materials related to CHF provided by MyChart link.  The patient verbalized understanding of instructions, educational materials, and  care plan provided today and DECLINED offer to receive copy of patient instructions, educational materials, and care plan.   Telephone follow up appointment with care management team member scheduled for:  04/12/24  1 pm  Jeffrey Leber RN, BSN, [facility]  [employer], [facility] Health  Care Coordinator Phone: [phone]      Following is a copy of your plan of care:  There are no care plans that you recently modified to display for this patient.

## 2024-03-09 NOTE — Progress Notes (Signed)
 Patient presents to [facility] Clinic today for 2 month follow up alone. Reports no problem with VAD or equipment.   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 Currently being maintained by his wife weekly. Reports no issues. Denies tenderness or drainage at this exit site. Dressing CDI and anchor secure.  Pt Denies lightheadedness, dizziness, falls, shortness of breath, and signs of bleeding. States he has not taken any Torsemide  recently. Continues Semaglutide  2mg  weekly. Weight down 14lbs since last visit.  Pt expresses concerns about "being down". Reports lack of motivation and little interest in socialization at times. Pt states his depression seems to fluctuate day to day. Currently on 150mg  of Zoloft . Discussed with Dr. Alease Amend at bedside.Will send referral to psych at pt's request to assistance with medication management.   Pt remains hypertensive today. See vitals below. Coreg  3.125 BID added per Dr. Alease Amend today. Will obtain echo prior to next appointment.  Vital Signs:  Doppler Pressure: 102 Automatic BP: 129/ (106) Repeat: 107/83 (93) HR: 73 NSR SPO2: 95% on RA   Weight: 293 bs lb w/ eqt Last weight: 307.8 lbs w/eqt   VAD Indication: Destination Therapy due to smoking and BMI   VAD interrogation & Equipment Management: Speed: 6200 Flow: 5.5 Power: 5.2 w    PI: 2.4 Hct: 44   Alarms: none Events: 5-10  Fixed speed 6200 Low speed limit: 5900   Primary Controller: Replace back up battery in 23 mths Back up controller:  pt did not bring to clinic-instructed to bring next visit   Annual Equipment Maintenance on UBC/PM was performed on 06/17/23.    I reviewed the LVAD parameters from today and compared the results to the patient's prior recorded data. LVAD interrogation was NEGATIVE for significant power changes, NEGATIVE for clinical alarms and STABLE for PI events/speed drops. No programming changes were made and pump is  functioning within specified parameters. Pt is performing daily controller and system monitor self tests along with completing weekly and monthly maintenance for LVAD equipment.   LVAD equipment check completed and is in good working order. Back-up equipment not present.    Exit Site Care: Drive line being maintained weekly by pt's wife. Existing dressing CDI. Provided patient with 8 weekly kits and 8 anchors.  Device:  Left subcutaneous dual ICD Therapies: on at 250 BPM Last check: 03/02/24  BP & Labs:  Doppler 102 - Doppler is reflecting modified systolic   Hgb 15.7  - No S/S of bleeding. Specifically denies melena/BRBPR or nosebleeds.   LDH 223 with established baseline of 160 - 300. Denies tea-colored urine. No power elevations noted on interrogation.   Plan:  Start Coreg  3.125 BID Referral sent to Psych please look out for a phone call for scheduling Return to VAD Clinic in 2 months for f/u with echo prior  Laurice Pope RN, BSN VAD Coordinator 24/7 Pager [phone]

## 2024-03-17 ENCOUNTER — Other Ambulatory Visit: Payer: Self-pay | Admitting: Nurse Practitioner

## 2024-03-17 ENCOUNTER — Other Ambulatory Visit: Payer: Self-pay

## 2024-03-17 MED ORDER — AMLODIPINE BESYLATE 10 MG PO TABS
10.0000 mg | ORAL_TABLET | Freq: Every day | ORAL | 6 refills | Status: DC
  Filled 2024-03-17: qty 30, 30d supply, fill #0
  Filled 2024-04-19 – 2024-04-21 (×2): qty 30, 30d supply, fill #1
  Filled 2024-06-23: qty 30, 30d supply, fill #2

## 2024-03-20 ENCOUNTER — Other Ambulatory Visit (HOSPITAL_COMMUNITY): Payer: Self-pay

## 2024-03-20 DIAGNOSIS — F32A Depression, unspecified: Secondary | ICD-10-CM

## 2024-03-20 DIAGNOSIS — Z95811 Presence of heart assist device: Secondary | ICD-10-CM

## 2024-03-24 ENCOUNTER — Other Ambulatory Visit (HOSPITAL_COMMUNITY): Payer: Self-pay | Admitting: Unknown Physician Specialty

## 2024-03-24 DIAGNOSIS — Z7901 Long term (current) use of anticoagulants: Secondary | ICD-10-CM

## 2024-03-24 DIAGNOSIS — Z95811 Presence of heart assist device: Secondary | ICD-10-CM

## 2024-03-30 ENCOUNTER — Other Ambulatory Visit (HOSPITAL_COMMUNITY)

## 2024-04-03 ENCOUNTER — Ambulatory Visit (HOSPITAL_COMMUNITY)
Admission: RE | Admit: 2024-04-03 | Discharge: 2024-04-03 | Disposition: A | Discharge status: Home / Self Care | Source: Ambulatory Visit | Attending: Cardiology | Admitting: Cardiology

## 2024-04-03 ENCOUNTER — Other Ambulatory Visit (HOSPITAL_COMMUNITY): Payer: Self-pay | Admitting: Unknown Physician Specialty

## 2024-04-03 DIAGNOSIS — Z95811 Presence of heart assist device: Secondary | ICD-10-CM

## 2024-04-03 DIAGNOSIS — Z7901 Long term (current) use of anticoagulants: Secondary | ICD-10-CM

## 2024-04-04 ENCOUNTER — Ambulatory Visit (HOSPITAL_COMMUNITY): Payer: Self-pay | Admitting: Family

## 2024-04-10 ENCOUNTER — Ambulatory Visit: Payer: Self-pay | Admitting: Nurse Practitioner

## 2024-04-12 ENCOUNTER — Other Ambulatory Visit: Payer: Self-pay

## 2024-04-12 NOTE — Patient Outreach (Signed)
 Complex Care Management   Visit Note  04/12/2024  Name:  Jeffrey [name] MRN: [id number] DOB: 17-Oct-1975  Situation: Referral received for Complex Care Management related to Heart Failure I obtained verbal consent from Patient.  Visit completed with patient  on the phone  Background:   Past Medical History:  Diagnosis Date   Anxiety    Aspirin  allergy    Childhood asthma    Chronic systolic CHF (congestive heart failure) (HCC)    a. EF 20-25% in 2012. b. EF 45-50% in 10/2011 with nonischemic nuc - presumed NICM. c. 12/2014 Echo: Sev depressed LV fxn, sev dil LV, mild LVH, mild MR, sev dil LA, mildly reduced RV fxn.   CKD (chronic kidney disease) stage 2, GFR 60-89 ml/min    Depression    H/O vasectomy 12/2019   High cholesterol    Hypertension    Morbid obesity (HCC)    Nephrolithiasis    OSA on CPAP    Paroxysmal atrial fibrillation (HCC)    Presumed NICM    a. 04/2014 Myoview : EF 26%, glob HK, sev glob HK, ? prior infarct;  b. Never cathed 2/2 CKD.   Renal cell carcinoma (HCC)    a. s/p Rt robotic assisted partial converted to radical nephrectomy on 01/2013.   Troponin level elevated    a. 04/2014, 12/2014: felt due to CHF.   Type II diabetes mellitus (HCC)    Ventricular tachycardia (HCC)    a. appropriate ICD therapy 12/2017    Assessment:  The patient has reported a weight loss of an additional five pounds (last weight recorded at cardiology on 03/07/24 was 293 lbs). He expressed that he is feeling physically better; however, he continues to face some emotional challenges. The patient has scheduled an appointment with a counselor on July 29th. He inquired about assistance with his utility bills, and I informed him of the community resources available through the [facility] application. The patient expressed gratitude for this information and indicated that he intends to utilize it. Furthermore, I provided him with the contact number for his insurance company so that he can  acquire comprehensive information regarding his benefits.  Patient Reported Symptoms:  Cognitive Cognitive Status: Able to follow simple commands, Alert and oriented to person, place, and time, Normal speech and language skills      Neurological Neurological Review of Symptoms: No symptoms reported    HEENT HEENT Symptoms Reported: No symptoms reported      Cardiovascular   Cardiovascular Management Strategies: Medication therapy  Respiratory Respiratory Symptoms Reported: No symptoms reported    Endocrine Endocrine Symptoms Reported: No symptoms reported Is patient diabetic?: Yes Is patient checking blood sugars at home?: Yes List most recent blood sugar readings, include date and time of day: 91 this morning he checks twice a day    Gastrointestinal Gastrointestinal Symptoms Reported: No symptoms reported      Genitourinary Genitourinary Symptoms Reported: No symptoms reported    Integumentary Integumentary Symptoms Reported: No symptoms reported    Musculoskeletal Musculoskelatal Symptoms Reviewed: No symptoms reported   Falls in the past year?: No    Psychosocial       Quality of Family Relationships: supportive Do you feel physically threatened by others?: No      04/12/2024    1:28 PM  Depression screen PHQ 2/9  Decreased Interest 1  Down, Depressed, Hopeless 2  PHQ - 2 Score 3  Altered sleeping 1  Change in appetite 1  Feeling bad or  failure about yourself  2  Trouble concentrating 1  Moving slowly or fidgety/restless 1  Suicidal thoughts 0  PHQ-9 Score 9    There were no vitals filed for this visit.  Medications Reviewed Today   Medications were not reviewed in this encounter     Recommendation:   PCP Follow-up  Follow Up Plan:   Telephone follow up appointment with care management team member scheduled for:  Wednesday Aug 19th at 2:00pm. With Jeffrey [name] RNCM   Monty Mccarrell Weyman RN, BSN, [facility] [place]  [facility],  [facility] Health    Care Coordinator Phone: [phone]

## 2024-04-12 NOTE — Patient Instructions (Addendum)
 Visit Information  Jeffrey [name] was given information about Medicaid Managed Care team care coordination services as a part of their Healthy [facility] Medicaid benefit. Jeffrey [name] Carolee verbally consented to engagement with the [facility] Managed Care team.   If you are experiencing a medical emergency, please call 911 or report to your local emergency department or urgent care.   If you have a non-emergency medical problem during routine business hours, please contact your provider's office and ask to speak with a nurse.   For questions related to your Healthy [facility] health plan, please call: [phone] or visit the homepage here: [email or web address]  If you would like to schedule transportation through your Healthy [facility] plan, please call the following number at least 2 days in advance of your appointment: [phone]  For information about your ride after you set it up, call Ride Assist at [phone]. Use this number to activate a Will Call pickup, or if your transportation is late for a scheduled pickup. Use this number, too, if you need to make a change or cancel a previously scheduled reservation.  If you need transportation services right away, call [phone]. The after-hours call center is staffed 24 hours to handle ride assistance and urgent reservation requests (including discharges) 365 days a year. Urgent trips include sick visits, hospital discharge requests and life-sustaining treatment.  Call the [facility] Line at [phone], at any time, 24 hours a day, 7 days a week. If you are in danger or need immediate medical attention call 911.  If you would like help to quit smoking, call 1-800-QUIT-NOW (703-712-9662) OR Espaol: 1-855-Djelo-Ya (8-144-664-6430) o para ms informacin haga clic aqu or Text READY to 799-599 to register via text  [name] - following are the goals we discussed in your visit today:   Goals  Addressed             This Visit's Progress    COMPLETED: VBCI RN Care Plan       Problems:  Chronic Disease Management support and education needs related to CHF and Obesity  Goal: Over the next 6 months the Patient will demonstrate a decrease CHF in exacerbations as evidenced by no hospitalizations.  demonstrate Improved adherence to prescribed treatment plan for CHF and Weight Loss as evidenced by weight loss with goal of less than BMI of 35.   Interventions:   Heart Failure Interventions:  (Status:  New goal.) Long Term Goal Reviewed role of diuretics in prevention of fluid overload and management of heart failure; Discussed the importance of keeping all appointments with provider Screening for signs and symptoms of depression related to chronic disease state      Patient Self-Care Activities:  Attend all scheduled provider appointments Call provider office for new concerns or questions  Take medications as prescribed   call the Suicide and Crisis Lifeline: 988 if experiencing a Mental Health or Behavioral Health Crisis follow rescue plan if symptoms flare-up know when to call the doctor:if torsemide  does not alleviate shortness of breath symptoms  Take Ozempic  as directed to help with weight loss, follow activity plan, follow meal plan.  LAST WEIGHT recorded at cardiology on 03/07/24 was 293 lbs.    Plan:  Telephone follow up appointment with care management team member scheduled for:  Wednesday Aug 19th at 2:00pm. With [name] [facility]           VBCI RN Care Plan-CHF       Problems:  Chronic Disease Management support and education  needs related to CHF  Goal: Over the next 45 days the Patient will attend all scheduled medical appointments: with PCP and specialist as evidenced by keeping al scheduled appointment CHF as evidenced by no admissions to the hospital verbalize basic understanding of CHF disease process and self health management plan as evidenced by  verbal explanation lifestyle changes and consistent medication compliance   Interventions:   STOP LIGHT  Heart Failure (HF) Definition: When the heart muscle does not pump enough blood through the heart, leaving it very weak.   Some Heart Failure signs/symptoms:  Shortness of breath with or without activities Weakness Swelling in your legs, ankles, feet, and/or abdomen Sudden weight gain from fluid retention Coughing or wheezing with possible mucus build up  Things you can do: Improve your quality of life by managing your stress levels, reducing the salt in your diet, exercise and lose weight if necessary.     Heart Failure Interventions: Provided education on low sodium diet Advised patient to weigh each morning after emptying bladder Discussed importance of daily weight and advised patient to weigh and record daily- The patient does not weigh daily due to he does not want to get discouraged Reviewed role of diuretics in prevention of fluid overload and management of heart failure; Discussed the importance of keeping all appointments with provider Screening for signs and symptoms of depression related to chronic disease state  Assessed social determinant of health barriers  He continues to take his Ozempic  and has lost down to 278 LBS.  Advised him to continue the good work to reach his goal.  Patient Self-Care Activities:  Attend all scheduled provider appointments Call pharmacy for medication refills 3-7 days in advance of running out of medications Call provider office for new concerns or questions  Perform all self care activities independently  Take medications as prescribed   call office if I gain more than 2 pounds in one day or 5 pounds in one week track weight in diary watch for swelling in feet, ankles and legs every day weigh myself daily bring diary to all appointments follow rescue plan if symptoms flare-up  Plan:  Telephone follow up appointment with  care management team member scheduled for: Tuesday Aug 19th at 2:00pm. With [name] RNCM              Please see education materials related to CHF provided by MyChart link.  Patient verbalizes understanding of instructions and care plan provided today and agrees to view in MyChart. Active MyChart status and patient understanding of how to access instructions and care plan via MyChart confirmed with patient.      Telephone follow up appointment with care management team member scheduled for:  Wednesday Aug 19th at 2:00pm. With [name] RNCM      Emonee Winkowski Weyman RN, BSN, [facility]  [facility], [facility]    Care Coordinator Phone: [phone]      Following is a copy of your plan of care:  There are no care plans that you recently modified to display for this patient.

## 2024-04-13 ENCOUNTER — Ambulatory Visit (HOSPITAL_COMMUNITY)
Admission: RE | Admit: 2024-04-13 | Discharge: 2024-04-13 | Disposition: A | Discharge status: Home / Self Care | Source: Ambulatory Visit | Attending: Cardiology | Admitting: Cardiology

## 2024-04-13 ENCOUNTER — Ambulatory Visit (HOSPITAL_COMMUNITY): Payer: Self-pay | Admitting: Pharmacist

## 2024-04-13 DIAGNOSIS — Z7901 Long term (current) use of anticoagulants: Secondary | ICD-10-CM | POA: Diagnosis not present

## 2024-04-13 DIAGNOSIS — Z4509 Encounter for adjustment and management of other cardiac device: Secondary | ICD-10-CM | POA: Diagnosis not present

## 2024-04-13 DIAGNOSIS — Z95811 Presence of heart assist device: Secondary | ICD-10-CM | POA: Diagnosis not present

## 2024-04-13 LAB — PROTIME-INR
INR: 2.5 — ABNORMAL HIGH (ref 0.8–1.2)
Prothrombin Time: 28.6 s — ABNORMAL HIGH (ref 11.4–15.2)

## 2024-04-17 NOTE — Progress Notes (Signed)
 04/24/2024 Name: Jeffrey [name] MRN: [id number] DOB: [birth year]  Chief Complaint  Patient presents with   Diabetes    Jeffrey [name] is a 48 y.o. year old male who presented for a telephone visit.   They were referred to the pharmacist by their PCP for assistance in managing diabetes.   PMH significant for systolic heart failure due to NICM s/p LVAD, T2DM, obesity, CKD IIIa, R renal cell carcinoma s/p nephrectomy  Subjective: Patient was last engaged by pharmacy on 02/29/24 for weight management. He was tolerating Ozempic  well, and was instructed to increase to 2 mg weekly. He was last seen in advanced HF clinic on 03/09/24 by Dr. Zenaida. He reported NYHA class I HF symptoms and was euvolemic. He was initiated on carvedilol  3.125 mg BID. His BMI was 43.9 kg/m2, weight 293 lbs - down from 303 lbs ~1.5 months prior.   Today, patient reports doing well. He is tolerating Ozempic  2 mg weekly without issue.   Care Team: Primary Care Provider: Oley Bascom RAMAN, NP ; Next Scheduled Visit: 05/02/24 Cardiologist: Dr. Bensimhon; Next Scheduled Visit: 05/08/24  Medication Access/Adherence  Current Pharmacy:  [facility] [address], [address] [place] [address] Phone: [phone] Fax: [phone]  [id number] - [place], [place] - [address] AT [facility] [address]  [place] [address] Phone: [phone] Fax: [phone]   Patient reports affordability concerns with their medications: No  Patient reports access/transportation concerns to their pharmacy: No  Patient reports adherence concerns with their medications:  No     Diabetes:  Current medications: Ozempic  2 mg weekly, metformin  500 mg BID, Jardiance  10 mg daily  Patient reports he is tolerating Ozempic  2 mg well. Denies GI side effects and has noticed his appetite decreasing this last dose increase. He has been weighing  himself and has noticed slow/consistent weight loss.  Baseline weight (Feb 2025- restarted Ozempic ): 308.4 lbs (BMI 48.3 kg/m2) Most recent weight (03/09/24): 293 lbs (BMI 45.48 kg/m2)  Using glucometer; testing once daily Fasting BG: 85-100  Patient denies hypoglycemic s/sx including dizziness, shakiness, sweating. Patient denies hyperglycemic symptoms including polyuria, polydipsia, polyphagia, nocturia, neuropathy, blurred vision.  Current meal patterns: Usually has 2 meals per day + snacks.  - feels his appetite is getting smaller - Breakfast: blueberries, raspberries, apple, eggs - Supper: meat and vegetable - Snacks: has not been snacking as much - Drinks: mainly water , occasionally hot green tea (no sugar)  Current physical activity: He reports he is doing some physical activity, but not as much as he would like to. Reported walking and doing weight bearing exercise (occasional strength training).   Objective:  Lab Results  Component Value Date   HGBA1C 5.5 01/05/2024    Lab Results  Component Value Date   CREATININE 1.90 (H) 03/09/2024   BUN 25 (H) 03/09/2024   NA 138 03/09/2024   K 4.0 03/09/2024   CL 109 03/09/2024   CO2 22 03/09/2024    Lab Results  Component Value Date   CHOL 123 03/18/2023   HDL 46 03/18/2023   LDLCALC 53 03/18/2023   TRIG 138 03/18/2023   CHOLHDL 2.7 03/18/2023    Medications Reviewed Today     Reviewed by Brinda Lorain SQUIBB, RPH (Pharmacist) on 04/24/24 at 1208  Med List Status: <None>   Medication Order Taking? Sig Documenting Provider Last Dose Status Informant  acetaminophen  (TYLENOL ) 325 MG tablet [id number]  Take  2 tablets (650 mg total) by mouth every 6 (six) hours as needed for fever. Lenetta Greig BIRCH, NP  Active Self, Pharmacy Records           Med Note [facility], [facility]   Thu Mar 18, 2023  2:30 PM) PRN   allopurinol  (ZYLOPRIM ) 100 MG tablet [id number]  Take 1 tablet (100 mg total) by mouth daily. Bensimhon, Toribio SAUNDERS, MD   Active   amiodarone  (PACERONE ) 200 MG tablet [id number]  Take 1 tablet (200 mg total) by mouth daily. Bensimhon, Toribio SAUNDERS, MD  Active   amLODipine  (NORVASC ) 10 MG tablet [id number]  Take 1 tablet (10 mg total) by mouth daily Nichols, Tonya S, NP  Active   Blood Pressure KIT [id number]  1 Units by Does not apply route daily. Oley Bascom RAMAN, NP  Active   carvedilol  (COREG ) 3.125 MG tablet [id number]  Take 1 tablet (3.125 mg total) by mouth 2 (two) times daily with a meal. Zenaida Morene PARAS, MD  Active   cefadroxil  (DURICEF) 500 MG capsule [id number]  Take 1 capsule (500 mg total) by mouth 2 (two) times daily. Bensimhon, Toribio SAUNDERS, MD  Active   colchicine  0.6 MG tablet [id number]  Take 1 tablet (0.6 mg total) by mouth once daily. Bensimhon, Toribio SAUNDERS, MD  Active   cyanocobalamin  (VITAMIN B12) 1000 MCG tablet [id number]  Take 1 tablet (1,000 mcg total) by mouth daily. Oley Bascom RAMAN, NP  Active   cyclobenzaprine  (FLEXERIL ) 5 MG tablet [id number]  Take 1 tablet (5 mg total) by mouth 3 (three) times daily as needed for muscle spasms   Active   doxazosin  (CARDURA ) 4 MG tablet [id number]  Take 2 tablets (8 mg total) by mouth 2 (two) times daily. Bensimhon, Toribio SAUNDERS, MD  Active   empagliflozin  (JARDIANCE ) 10 MG TABS tablet [id number]  Take 1 tablet (10 mg total) by mouth daily before breakfast. Bensimhon, Toribio SAUNDERS, MD  Active   ferrous sulfate  (FEROSUL) 325 (65 FE) MG tablet [id number]  Take 1 tablet (325 mg total) by mouth daily before breakfast. Nichols, Tonya S, NP  Active   folic acid  (FOLVITE ) 800 MCG tablet [id number]  Take 0.5 tablets (400 mcg total) by mouth daily. Oley Bascom RAMAN, NP  Active   gabapentin  (NEURONTIN ) 400 MG capsule [id number]  Take 1 capsule (400 mg total) by mouth 3 (three) times daily. Bensimhon, Toribio SAUNDERS, MD  Active   glucose blood test strip [id number]  Use as directed 4 times daily. Shannan Sia I, NP  Active Self, Pharmacy Records  hydrocortisone  (ANUSOL -HC) 25 MG suppository [id number]   Place 1 suppository (25 mg total) rectally at bedtime.  Patient not taking: Reported on 04/12/2024   Kennedy-Smith, Colleen M, NP  Active   isosorbide -hydrALAZINE  (BIDIL ) 20-37.5 MG tablet [id number]  Take 2 tablets by mouth 3 (three) times daily. Bensimhon, Toribio SAUNDERS, MD  Active   meclizine  (ANTIVERT ) 25 MG tablet [id number]  Take 1 tablet (25 mg total) by mouth 3 (three) times daily as needed for dizziness. Bensimhon, Toribio SAUNDERS, MD  Active Self, Pharmacy Records           Med Note [place], DOROTHYANN ONEIDA [place] May 05, 2023  9:08 AM) More dizziness/vertigo lately    Discontinued 04/24/24 1024 (Change in therapy)   ondansetron  (ZOFRAN -ODT) 4 MG disintegrating tablet [id number]  Take 1 tablet (4 mg total) by mouth every 8 (eight) hours as needed for nausea or vomiting. Zenaida Morene PARAS, MD  Active   pantoprazole  (PROTONIX )  40 MG tablet [id number]  Take 1 tablet (40 mg total) by mouth once daily. Bensimhon, Toribio SAUNDERS, MD  Active   rosuvastatin  (CRESTOR ) 10 MG tablet [id number]  Take 1 tablet (10 mg total) by mouth daily. Bensimhon, Toribio SAUNDERS, MD  Active   sacubitril -valsartan  (ENTRESTO ) 97-103 MG [id number]  Take 1 tablet by mouth 2 (two) times daily. Bensimhon, Toribio SAUNDERS, MD  Active Self, Pharmacy Records  Semaglutide , 2 MG/DOSE, 8 MG/3ML SOPN [id number] Yes Inject 2 mg as directed once a week. Oley Bascom RAMAN, NP  Active   sertraline  (ZOLOFT ) 100 MG tablet [id number]  Take 1.5 tablets (150 mg total) by mouth daily. Bensimhon, Toribio SAUNDERS, MD  Active   spironolactone  (ALDACTONE ) 25 MG tablet [id number]  Take 1 tablet (25 mg total) by mouth daily. Bensimhon, Toribio SAUNDERS, MD  Active   torsemide  (DEMADEX ) 20 MG tablet [id number]  Take 1 tablet (20 mg total) by mouth as needed. Bensimhon, Toribio SAUNDERS, MD  Active   traMADol  HCl 100 MG TABS [id number]  Take 1 tablet (100 mg) by mouth every 6 (six) hours as needed for pain Bensimhon, Toribio SAUNDERS, MD  Active   traZODone  (DESYREL ) 100 MG tablet [id number]  Take 1 tablet (100 mg total) by  mouth at bedtime. Bensimhon, Daniel R, MD  Active   warfarin (COUMADIN ) 4 MG tablet [id number]  Take 12 mg (3 tabs) every Tuesday and 8 mg (2 tabs) all other days or as directed by HF clinic Bensimhon, Toribio SAUNDERS, MD  Active               Assessment/Plan:   Diabetes: - Currently controlled with last A1C 5.5%, below goal <7%. His goal is to lose weight to be eligible for heart transplant - needs BMI ~35. His BMI has improved from 48 to 43 kg/m2 since restarting Ozempic  in Feb 2025.  Discussed the importance of lifestyle along with medication to help with weight loss. Encouraged him in plan to be more active with his kids. Appropriate to continue Ozempic  to 2 mg weekly for weight loss benefit as he is tolerating this well. Noted that patient's most recent eGFR was < 45 mL/min. Given well-controlled A1C and fluctuation in kidney function, it is appropriate to discontinue metformin .  - Reviewed long term cardiovascular and renal outcomes of uncontrolled blood sugar - Reviewed goal A1c, goal fasting, and goal 2 hour post prandial glucose - Reviewed dietary modifications including continuing to limit his portion sizes - Reviewed lifestyle modifications including: encouraged him to increase physical activity with the goal of walking for 60 minutes every day as he was previously - Recommend to continue Ozempic  to 2 mg weekly and Jardiance  10 mg daily - Recommend to stop metformin  500 mg BID  - Patient denies personal or family history of multiple endocrine neoplasia type 2, medullary thyroid  cancer; personal history of pancreatitis or gallbladder disease. - Recommend to check fasting blood glucose daily and notify clinic if FBG increases > 130 mg/dL after discontinuing metformin .    Follow Up Plan: PharmD telephone 05/31/24   Lorain Baseman, PharmD [facility] [phone]

## 2024-04-19 ENCOUNTER — Other Ambulatory Visit (HOSPITAL_COMMUNITY): Payer: Self-pay | Admitting: Internal Medicine

## 2024-04-19 ENCOUNTER — Other Ambulatory Visit (HOSPITAL_COMMUNITY): Payer: Self-pay

## 2024-04-19 ENCOUNTER — Other Ambulatory Visit: Payer: Self-pay

## 2024-04-19 DIAGNOSIS — Z95811 Presence of heart assist device: Secondary | ICD-10-CM

## 2024-04-19 MED ORDER — SERTRALINE HCL 100 MG PO TABS
150.0000 mg | ORAL_TABLET | Freq: Every day | ORAL | 3 refills | Status: DC
  Filled 2024-04-19: qty 45, 30d supply, fill #0
  Filled 2024-05-22: qty 45, 30d supply, fill #1
  Filled 2024-06-23: qty 45, 30d supply, fill #2

## 2024-04-21 ENCOUNTER — Other Ambulatory Visit: Payer: Self-pay

## 2024-04-21 NOTE — Addendum Note (Signed)
 Addended by: VICCI SELLER A on: 04/21/2024 02:52 PM   Modules accepted: Orders

## 2024-04-21 NOTE — Progress Notes (Signed)
 Remote ICD transmission.

## 2024-04-24 ENCOUNTER — Other Ambulatory Visit: Payer: Self-pay

## 2024-04-24 ENCOUNTER — Other Ambulatory Visit (HOSPITAL_COMMUNITY): Payer: Self-pay

## 2024-04-24 DIAGNOSIS — E1122 Type 2 diabetes mellitus with diabetic chronic kidney disease: Secondary | ICD-10-CM

## 2024-04-26 ENCOUNTER — Other Ambulatory Visit: Payer: Self-pay

## 2024-04-27 ENCOUNTER — Other Ambulatory Visit: Payer: Self-pay

## 2024-04-28 ENCOUNTER — Other Ambulatory Visit: Payer: Self-pay

## 2024-05-01 ENCOUNTER — Other Ambulatory Visit: Payer: Self-pay

## 2024-05-01 ENCOUNTER — Other Ambulatory Visit (HOSPITAL_COMMUNITY): Payer: Self-pay | Admitting: *Deleted

## 2024-05-01 DIAGNOSIS — I5022 Chronic systolic (congestive) heart failure: Secondary | ICD-10-CM

## 2024-05-01 DIAGNOSIS — Z7901 Long term (current) use of anticoagulants: Secondary | ICD-10-CM

## 2024-05-01 DIAGNOSIS — Z95811 Presence of heart assist device: Secondary | ICD-10-CM

## 2024-05-02 ENCOUNTER — Ambulatory Visit (HOSPITAL_BASED_OUTPATIENT_CLINIC_OR_DEPARTMENT_OTHER)
Admission: RE | Admit: 2024-05-02 | Discharge: 2024-05-02 | Disposition: A | Discharge status: Home / Self Care | Source: Ambulatory Visit | Attending: Cardiology

## 2024-05-02 ENCOUNTER — Ambulatory Visit (HOSPITAL_COMMUNITY)
Admission: RE | Admit: 2024-05-02 | Discharge: 2024-05-02 | Disposition: A | Discharge status: Home / Self Care | Source: Ambulatory Visit | Attending: Internal Medicine | Admitting: Internal Medicine

## 2024-05-02 ENCOUNTER — Ambulatory Visit (HOSPITAL_COMMUNITY): Payer: Self-pay | Admitting: Pharmacist

## 2024-05-02 ENCOUNTER — Ambulatory Visit (HOSPITAL_COMMUNITY): Payer: Self-pay | Admitting: Family

## 2024-05-02 ENCOUNTER — Other Ambulatory Visit (HOSPITAL_COMMUNITY): Payer: Self-pay | Admitting: *Deleted

## 2024-05-02 VITALS — BP 89/75 | HR 71 | Wt 285.4 lb

## 2024-05-02 DIAGNOSIS — I5022 Chronic systolic (congestive) heart failure: Secondary | ICD-10-CM

## 2024-05-02 DIAGNOSIS — Z7901 Long term (current) use of anticoagulants: Secondary | ICD-10-CM | POA: Insufficient documentation

## 2024-05-02 DIAGNOSIS — Z95811 Presence of heart assist device: Secondary | ICD-10-CM

## 2024-05-02 DIAGNOSIS — I110 Hypertensive heart disease with heart failure: Secondary | ICD-10-CM | POA: Insufficient documentation

## 2024-05-02 DIAGNOSIS — E119 Type 2 diabetes mellitus without complications: Secondary | ICD-10-CM | POA: Diagnosis not present

## 2024-05-02 DIAGNOSIS — E785 Hyperlipidemia, unspecified: Secondary | ICD-10-CM | POA: Diagnosis not present

## 2024-05-02 LAB — PROTIME-INR
INR: 1.4 — ABNORMAL HIGH (ref 0.8–1.2)
Prothrombin Time: 17.4 s — ABNORMAL HIGH (ref 11.4–15.2)

## 2024-05-02 LAB — CBC
HCT: 44.4 % (ref 39.0–52.0)
Hemoglobin: 14.9 g/dL (ref 13.0–17.0)
MCH: 28.5 pg (ref 26.0–34.0)
MCHC: 33.6 g/dL (ref 30.0–36.0)
MCV: 85.1 fL (ref 80.0–100.0)
Platelets: 168 K/uL (ref 150–400)
RBC: 5.22 MIL/uL (ref 4.22–5.81)
RDW: 17.8 % — ABNORMAL HIGH (ref 11.5–15.5)
WBC: 4.8 K/uL (ref 4.0–10.5)
nRBC: 0 % (ref 0.0–0.2)

## 2024-05-02 LAB — ECHOCARDIOGRAM LIMITED
Est EF: <20
Weight: 4566.4 [oz_av]

## 2024-05-02 LAB — BASIC METABOLIC PANEL WITH GFR
Anion gap: 9 (ref 5–15)
BUN: 21 mg/dL — ABNORMAL HIGH (ref 6–20)
CO2: 19 mmol/L — ABNORMAL LOW (ref 22–32)
Calcium: 8.9 mg/dL (ref 8.9–10.3)
Chloride: 109 mmol/L (ref 98–111)
Creatinine, Ser: 1.35 mg/dL — ABNORMAL HIGH (ref 0.61–1.24)
GFR, Estimated: >60 mL/min (ref >60)
Glucose, Bld: 133 mg/dL — ABNORMAL HIGH (ref 70–99)
Potassium: 4.8 mmol/L (ref 3.5–5.1)
Sodium: 137 mmol/L (ref 135–145)

## 2024-05-02 LAB — LACTATE DEHYDROGENASE: LDH: 293 U/L — ABNORMAL HIGH (ref 98–192)

## 2024-05-02 NOTE — Progress Notes (Signed)
 Patient presents to VAD Clinic today for 2 month follow up alone. Reports no problem with VAD or equipment.   Just arrived home from a week at [facility]. States he had a great time with his family. Denies lightheadedness, dizziness, falls, shortness of breath, and signs of bleeding. States he has not taken any Torsemide  recently. Continues Semaglutide  2mg  weekly. Weight down 8 lbs since last visit.  Psych referral sent last visit. Pt reports he has an upcoming appt.   Started Coreg  as prescribed last visit. BP improved today.   RAMP echo completed today. Speed increased to 6300. See separate note for details.   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 Currently being maintained by his wife weekly. Reports no issues. Denies tenderness or drainage at this exit site. Dressing CDI and anchor secure.  Vital Signs:  Doppler Pressure: 84 Automatic BP: 89/75 (82) HR: 71 NSR SPO2: 96% on RA   Weight: 285.4 lb w/ eqt Last weight: 293 lbs w/eqt   VAD Indication: Destination Therapy due to smoking and BMI   VAD interrogation & Equipment Management: Speed: 6200 >> 6300 Flow: 5.2 >> 5.0 Power: 5.3 w  >> 5.1 w  PI: 2.7 >> 2.6 Hct: 48   Alarms: none Events: 5-10  Fixed speed 6200 Low speed limit: 5900   Primary Controller: Replace back up battery in 22 mths Back up controller: Expired. Replaced with [id number]. Manufacture: 01/17/24 Expiration: 12/16/25. Replace back up battery in 33 mths   Annual Equipment Maintenance on UBC/PM was performed on 06/17/23.    I reviewed the LVAD parameters from today and compared the results to the patient's prior recorded data. LVAD interrogation was NEGATIVE for significant power changes, NEGATIVE for clinical alarms and STABLE for PI events/speed drops. No programming changes were made and pump is functioning within specified parameters. Pt is performing daily controller and system monitor self tests along  with completing weekly and monthly maintenance for LVAD equipment.   LVAD equipment check completed and is in good working order. Back-up equipment not present.    Exit Site Care: Drive line being maintained weekly by pt's wife. Existing dressing CDI. Provided patient with 4 boxes of large tegaderms for home use.  Device:  Left subcutaneous dual ICD Therapies: on at 250 BPM Last check: 03/02/24  BP & Labs:  Doppler 102 - Doppler is reflecting modified systolic   Hgb pending  - No S/S of bleeding. Specifically denies melena/BRBPR or nosebleeds.   LDH pending with established baseline of 160 - 300. Denies tea-colored urine. No power elevations noted on interrogation.   Plan:  No medication changes Speed was increased to 6300 today Coumadin  dosing per Lauren PharmD Return to clinic in 2 months. We will complete your 3 year Intermacs and annual maintenance at this visit. Please wear tennis shoes for your 6 minute walk. Please bring your Magazine features editor, MPU, and black bag for annual maintenance  Isaiah Knoll RN VAD Coordinator  Office: [phone]  24/7 Pager: [phone]

## 2024-05-02 NOTE — Patient Instructions (Signed)
 No medication changes Speed was increased to 6300 today Coumadin  dosing per Lauren PharmD Return to clinic in 2 months. We will complete your 3 year Intermacs and annual maintenance at this visit. Please wear tennis shoes for your 6 minute walk. Please bring your battery charger, MPU, and black bag for annual maintenance

## 2024-05-02 NOTE — Progress Notes (Signed)
 VAD CLINIC Follow-up Visit  PCP: Oley Bascom RAMAN, NP HF MD: DB   HPI:  Jeffrey [name] is a 48 y.o. yo male with a history of  poorly controlled HTN, R renal cell carcinoma s/p nephrectomy, CKD IIIa, DM2, OSA, gout, morbid obesity and systolic HF due to NICM. 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 D/c'd home with plan for 6 weeks IV Ancef  with plan to transition to cefadroxil  for long-term suppression on 09/28/22.   Here for follow up. Continues to have some issues with his mood, some days he is up, some days he feels like he has no interest in activities or interacting with others. Discussed pursuing psychiatric follow up and in agreement. BP continues to be elevated. No edema or swelling. No melena or BRBPR, no LVAD issues. Continues to take his antibiotics for suppression, no change in his driveline. He is down 14 pounds since last visit.    VAD Indication: Destination Therapy currently due to smoking and BMI   VAD interrogation & Equipment Management: Speed: 6200 Flow: 5.5 Power: 5.2    PI: 2.4 Hct: 44   Alarms: none Events: 5-10 PI  Fixed speed 6200 Low speed limit: 5900   Primary Controller: Replace back up battery in 23 months Back up controller:  pt did not bring to clinic-instructed to bring next visit   Annual Equipment Maintenance on UBC/PM was performed on 06/17/23.    I reviewed the LVAD parameters from today, and compared the results to the patient's prior recorded data.  No programming changes were made.  The LVAD is functioning within specified parameters.  The patient performs LVAD self-test daily.  LVAD interrogation was negative for any significant power changes, alarms or PI events/speed drops.  LVAD equipment check completed and is in good working order.      Past Medical History:  Diagnosis Date   Anxiety    Aspirin  allergy    Childhood asthma    Chronic systolic  CHF (congestive heart failure) (HCC)    a. EF 20-25% in 2012. b. EF 45-50% in 10/2011 with nonischemic nuc - presumed NICM. c. 12/2014 Echo: Sev depressed LV fxn, sev dil LV, mild LVH, mild MR, sev dil LA, mildly reduced RV fxn.   CKD (chronic kidney disease) stage 2, GFR 60-89 ml/min    Depression    H/O vasectomy 12/2019   High cholesterol    Hypertension    Morbid obesity (HCC)    Nephrolithiasis    OSA on CPAP    Paroxysmal atrial fibrillation (HCC)    Presumed NICM    a. 04/2014 Myoview : EF 26%, glob HK, sev glob HK, ? prior infarct;  b. Never cathed 2/2 CKD.   Renal cell carcinoma (HCC)    a. s/p Rt robotic assisted partial converted to radical nephrectomy on 01/2013.   Troponin level elevated    a. 04/2014, 12/2014: felt due to CHF.   Type II diabetes mellitus (HCC)    Ventricular tachycardia (HCC)    a. appropriate ICD therapy 12/2017    Current Outpatient Medications  Medication Sig Dispense Refill   acetaminophen  (TYLENOL ) 325 MG tablet Take 2 tablets (650 mg total) by mouth every 6 (six) hours as needed for fever.     allopurinol  (ZYLOPRIM ) 100 MG tablet Take 1 tablet (100 mg total) by mouth daily. 90 tablet 3   amiodarone  (PACERONE ) 200 MG tablet Take  1 tablet (200 mg total) by mouth daily. 30 tablet 11   amLODipine  (NORVASC ) 10 MG tablet Take 1 tablet (10 mg total) by mouth daily 30 tablet 6   Blood Pressure KIT 1 Units by Does not apply route daily. 1 kit 0   carvedilol  (COREG ) 3.125 MG tablet Take 1 tablet (3.125 mg total) by mouth 2 (two) times daily with a meal. 90 tablet 3   cefadroxil  (DURICEF) 500 MG capsule Take 1 capsule (500 mg total) by mouth 2 (two) times daily. 180 capsule 3   colchicine  0.6 MG tablet Take 1 tablet (0.6 mg total) by mouth once daily. 30 tablet 6   cyanocobalamin  (VITAMIN B12) 1000 MCG tablet Take 1 tablet (1,000 mcg total) by mouth daily. 30 tablet 0   cyclobenzaprine  (FLEXERIL ) 5 MG tablet Take 1 tablet (5 mg total) by mouth 3 (three) times daily  as needed for muscle spasms 15 tablet 0   doxazosin  (CARDURA ) 4 MG tablet Take 2 tablets (8 mg total) by mouth 2 (two) times daily. 120 tablet 6   empagliflozin  (JARDIANCE ) 10 MG TABS tablet Take 1 tablet (10 mg total) by mouth daily before breakfast. 30 tablet 11   ferrous sulfate  (FEROSUL) 325 (65 FE) MG tablet Take 1 tablet (325 mg total) by mouth daily before breakfast. 90 tablet 1   folic acid  (FOLVITE ) 800 MCG tablet Take 0.5 tablets (400 mcg total) by mouth daily. 30 tablet 2   gabapentin  (NEURONTIN ) 400 MG capsule Take 1 capsule (400 mg total) by mouth 3 (three) times daily. 30 capsule 6   glucose blood test strip Use as directed 4 times daily. 100 each 11   hydrocortisone  (ANUSOL -HC) 25 MG suppository Place 1 suppository (25 mg total) rectally at bedtime. (Patient not taking: Reported on 04/12/2024) 5 suppository 1   isosorbide -hydrALAZINE  (BIDIL ) 20-37.5 MG tablet Take 2 tablets by mouth 3 (three) times daily. 180 tablet 6   meclizine  (ANTIVERT ) 25 MG tablet Take 1 tablet (25 mg total) by mouth 3 (three) times daily as needed for dizziness. 90 tablet 2   ondansetron  (ZOFRAN -ODT) 4 MG disintegrating tablet Take 1 tablet (4 mg total) by mouth every 8 (eight) hours as needed for nausea or vomiting. 5 tablet 0   pantoprazole  (PROTONIX ) 40 MG tablet Take 1 tablet (40 mg total) by mouth once daily. 90 tablet 3   rosuvastatin  (CRESTOR ) 10 MG tablet Take 1 tablet (10 mg total) by mouth daily. 90 tablet 3   sacubitril -valsartan  (ENTRESTO ) 97-103 MG Take 1 tablet by mouth 2 (two) times daily. 60 tablet 6   Semaglutide , 2 MG/DOSE, 8 MG/3ML SOPN Inject 2 mg as directed once a week. 3 mL 5   sertraline  (ZOLOFT ) 100 MG tablet Take 1.5 tablets (150 mg total) by mouth daily. 135 tablet 3   spironolactone  (ALDACTONE ) 25 MG tablet Take 1 tablet (25 mg total) by mouth daily. 60 tablet 6   torsemide  (DEMADEX ) 20 MG tablet Take 1 tablet (20 mg total) by mouth as needed. 30 tablet 0   traMADol  HCl 100 MG TABS  Take 1 tablet (100 mg) by mouth every 6 (six) hours as needed for pain 90 tablet 0   traZODone  (DESYREL ) 100 MG tablet Take 1 tablet (100 mg total) by mouth at bedtime. 30 tablet 11   warfarin (COUMADIN ) 4 MG tablet Take 12 mg (3 tabs) every Tuesday and 8 mg (2 tabs) all other days or as directed by HF clinic 90 tablet 11   No current facility-administered  medications for this encounter.    Aspirin , Bee venom, Lisinopril , and Tomato    There were no vitals filed for this visit.     Vital Signs:  Doppler Pressure: 102, modified systolic Automatic BP: 124/96 HR: 73 SPO2: 95% on RA   Weight: 293 lb w/ eqt Last weight:  307.8  lbs w/eqt    Exam: General:  NAD.  HEENT: normal  Cor: LVAD hum. JVP flat, pulse not palpable  Lungs: Clear. Abdomen: obese soft, nontender, non-distended. Driveline site clean. Anchor in place.  Extremities: no cyanosis, clubbing, rash. Warm no edema  Neuro: alert & oriented x 3. No focal deficits. Moves all 4 without problem    ASSESSMENT AND PLAN:  .1. Chronic systolic CHF - Nonischemic CM, end-stage - EF 2012; 20-25%  - Echo  8/22: EF < 20%, severe LV dilation, moderate RV dysfunction.   - RHC (8/22): well compensated hemodynamics. - s/p S-ICD - S/p HM3 LVAD 07/21/21 (DT) - Dong great NYHA I  - Euvolemic on exam, will add carvedilol  3.125mg  BID - Otherwise on excellent medical therapy, continue bidil  2 tabs TID, spironolactone  25mg  daily, entresto  97/103mg  BID, amlodipine  10mg  daily - Aware of need to get to BMI ~35 to permit transplant  There is no height or weight on file to calculate BMI. Now back on OZempic . Significant weight loss - Echocardiogram at next visit  2. VAD  - s/p HM-3 placement 07/21/21  -VAD interrogated as above - INR 1.7 Goal INR 2.0-3.0 Discussed warfarin dosing with pharmacy - LDH 223 - DL site looks stable,  Continue cefadroxil  for suppression - Hgb 15.7  3. DL site infection - Resolved - Continue cefadroxil   for suppression, appreciate ID  4 Atrial fibrillation: Paroxysmal - Had severe AF with RVR 7/22 s/p DC-CV x 2 - Seen by EP. Not a candidate for ablation due to body habitus  (could consider AVN ablation and CRT, if needed)  - Had post-op AF with RVR post VAD - 10/23 S/P DC-CV with restoration of NSR. - Remains in NSR today. Continue amio 100 daily - On warfarin  5. HTN - MAPs still elevated, adjustments as above  6. CKD stage 3B  - Has solitary kidney.   - Baseline creatinine is 1.5-1.8.  - Creatinine stable at 1.9   7. OSA - Continue CPAP   8. DM2  - On Jardiance . - Hgb A1c 5.5 on 01/05/24  - PCP following - Continue Crestor  10  9. Polyarticular gout. - quiescent  10. Depression/anxiety - Continue Zoloft  150mg  daily - Referral to psych  11. Morbid obesity - There is no height or weight on file to calculate BMI. - Continue Ozempic .  - Improvement in symptoms   I spent a total of 48 minutes today: 1) reviewing the patient's medical records including previous charts, labs and recent notes from other providers; 2) examining the patient and counseling them on their medical issues/explaining the plan of care; 3) adjusting meds as needed and 4) ordering lab work or other needed tests.   Jeffrey JINNY Brownie, MD  2:16 PM

## 2024-05-02 NOTE — Progress Notes (Signed)
 Speed  Flow  PI  Power  LVIDD  AI  Aortic opening MR  TR  Septum  RV  VTI (>18cm)  6200 5.1 2.7 5.2 7.0 none 5/5 none none midline mild  17.8   6300 5.0 2.6 5.1 6.8 none 1/5 none none midline mild 20.6                                                           Doppler MAP: 84 Auto cuff BP: 89/75 (82)   Ramp ECHO performed at bedside per Dr Zenaida  At completion of ramp study, patients primary controller programmed:  Fixed speed: 6300 Low speed limit: 6000   Isaiah Knoll RN VAD Coordinator  Office: [phone]  24/7 Pager: [phone]

## 2024-05-04 ENCOUNTER — Other Ambulatory Visit: Payer: Self-pay

## 2024-05-05 ENCOUNTER — Other Ambulatory Visit (HOSPITAL_COMMUNITY): Payer: Self-pay

## 2024-05-08 ENCOUNTER — Ambulatory Visit (INDEPENDENT_AMBULATORY_CARE_PROVIDER_SITE_OTHER): Payer: Self-pay | Admitting: Nurse Practitioner

## 2024-05-08 ENCOUNTER — Encounter: Payer: Self-pay | Admitting: Nurse Practitioner

## 2024-05-08 DIAGNOSIS — E1122 Type 2 diabetes mellitus with diabetic chronic kidney disease: Secondary | ICD-10-CM | POA: Diagnosis not present

## 2024-05-08 DIAGNOSIS — Z794 Long term (current) use of insulin: Secondary | ICD-10-CM | POA: Diagnosis not present

## 2024-05-08 DIAGNOSIS — N1831 Chronic kidney disease, stage 3a: Secondary | ICD-10-CM | POA: Diagnosis not present

## 2024-05-08 LAB — POCT GLYCOSYLATED HEMOGLOBIN (HGB A1C): Hemoglobin A1C: 5.4 % (ref 4.0–5.6)

## 2024-05-08 NOTE — Progress Notes (Signed)
 Subjective   Patient ID: Jeffrey [name], male    DOB: [birth year], 48 y.o.   MRN: [id number]  Chief Complaint  Patient presents with   Diabetes    Referring provider: Oley Bascom RAMAN, NP  Jeffrey [name] is a 48 y.o. male with Past Medical History: No date: Anxiety No date: Aspirin  allergy No date: Childhood asthma No date: Chronic systolic CHF (congestive heart failure) (HCC)     Comment:  a. EF 20-25% in 2012. b. EF 45-50% in 10/2011 with               nonischemic nuc - presumed NICM. c. 12/2014 Echo: Sev               depressed LV fxn, sev dil LV, mild LVH, mild MR, sev dil               LA, mildly reduced RV fxn. No date: CKD (chronic kidney disease) stage 2, GFR 60-89 ml/min No date: Depression 12/2019: H/O vasectomy No date: High cholesterol No date: Hypertension No date: Morbid obesity (HCC) No date: Nephrolithiasis No date: OSA on CPAP No date: Paroxysmal atrial fibrillation (HCC) No date: Presumed NICM     Comment:  a. 04/2014 Myoview : EF 26%, glob HK, sev glob HK, ? prior              infarct;  b. Never cathed 2/2 CKD. No date: Renal cell carcinoma (HCC)     Comment:  a. s/p Rt robotic assisted partial converted to radical               nephrectomy on 01/2013. No date: Troponin level elevated     Comment:  a. 04/2014, 12/2014: felt due to CHF. No date: Type II diabetes mellitus (HCC) No date: Ventricular tachycardia (HCC)     Comment:  a. appropriate ICD therapy 12/2017   HPI  Diabetes Mellitus:    Patient presents for follow up of diabetes. Symptoms: none. Denies any symptoms. Patient denies foot ulcerations, hypoglycemia , and nausea.  Evaluation to date has been included: hemoglobin A1C.  Home sugars: patient does not check sugars. Treatment to date: no recent interventions. A1C is 5.4. Is requesting to start on ozempic  for weight loss - will refer to pharmacy for medication assistance.      Hypertension:    Patient here for follow-up of elevated blood pressure.  Patient is trying to loose weight for possible heart transplant. Cardiac symptoms none. Patient denies claudication, dyspnea, fatigue, and near-syncope.  Cardiovascular risk factors: hypertension, male gender, and obesity (BMI >= 30 kg/m2). Use of agents associated with hypertension: none.  Blood pressures have been low recently.  Has followed with nephrology and cardiology.     Denies f/c/s, n/v/d, hemoptysis, PND, leg swelling. Denies chest pain or edema.     Allergies  Allergen Reactions   Aspirin  Shortness Of Breath, Itching and Rash     Burning sensation (Patient reports he tolerates other NSAIDS)    Bee Venom Hives and Swelling   Lisinopril  Cough and Other (See Comments)   Tomato Hives    Immunization History  Administered Date(s) Administered   Influenza Split 10/24/2011   Influenza,inj,Quad PF,6+ Mos 07/23/2017, 09/06/2018, 09/18/2019   Pneumococcal Conjugate-13 11/01/2017   Pneumococcal Polysaccharide-23 10/24/2011   Tdap 04/07/2015    Tobacco History: Social History   Tobacco Use  Smoking Status Former   Current packs/day: 0.00   Average packs/day: 0.3 packs/day for 22.0 years (5.5 ttl pk-yrs)  Types: Cigarettes   Start date: 04/29/1993   Quit date: 04/30/2015   Years since quitting: 9.0  Smokeless Tobacco Never   Counseling given: Not Answered   Outpatient Encounter Medications as of 05/08/2024  Medication Sig   acetaminophen  (TYLENOL ) 325 MG tablet Take 2 tablets (650 mg total) by mouth every 6 (six) hours as needed for fever.   allopurinol  (ZYLOPRIM ) 100 MG tablet Take 1 tablet (100 mg total) by mouth daily.   amiodarone  (PACERONE ) 200 MG tablet Take 1 tablet (200 mg total) by mouth daily.   amLODipine  (NORVASC ) 10 MG tablet Take 1 tablet (10 mg total) by mouth daily   Blood Pressure KIT 1 Units by Does not apply route daily.   carvedilol  (COREG ) 3.125 MG tablet Take 1 tablet (3.125 mg total) by mouth 2 (two) times daily with a meal.   cefadroxil  (DURICEF)  500 MG capsule Take 1 capsule (500 mg total) by mouth 2 (two) times daily.   colchicine  0.6 MG tablet Take 1 tablet (0.6 mg total) by mouth once daily.   cyanocobalamin  (VITAMIN B12) 1000 MCG tablet Take 1 tablet (1,000 mcg total) by mouth daily.   cyclobenzaprine  (FLEXERIL ) 5 MG tablet Take 1 tablet (5 mg total) by mouth 3 (three) times daily as needed for muscle spasms   doxazosin  (CARDURA ) 4 MG tablet Take 2 tablets (8 mg total) by mouth 2 (two) times daily.   empagliflozin  (JARDIANCE ) 10 MG TABS tablet Take 1 tablet (10 mg total) by mouth daily before breakfast.   ferrous sulfate  (FEROSUL) 325 (65 FE) MG tablet Take 1 tablet (325 mg total) by mouth daily before breakfast.   folic acid  (FOLVITE ) 800 MCG tablet Take 0.5 tablets (400 mcg total) by mouth daily.   gabapentin  (NEURONTIN ) 400 MG capsule Take 1 capsule (400 mg total) by mouth 3 (three) times daily.   glucose blood test strip Use as directed 4 times daily.   isosorbide -hydrALAZINE  (BIDIL ) 20-37.5 MG tablet Take 2 tablets by mouth 3 (three) times daily.   meclizine  (ANTIVERT ) 25 MG tablet Take 1 tablet (25 mg total) by mouth 3 (three) times daily as needed for dizziness.   ondansetron  (ZOFRAN -ODT) 4 MG disintegrating tablet Take 1 tablet (4 mg total) by mouth every 8 (eight) hours as needed for nausea or vomiting.   pantoprazole  (PROTONIX ) 40 MG tablet Take 1 tablet (40 mg total) by mouth once daily.   rosuvastatin  (CRESTOR ) 10 MG tablet Take 1 tablet (10 mg total) by mouth daily.   sacubitril -valsartan  (ENTRESTO ) 97-103 MG Take 1 tablet by mouth 2 (two) times daily.   Semaglutide , 2 MG/DOSE, 8 MG/3ML SOPN Inject 2 mg as directed once a week.   sertraline  (ZOLOFT ) 100 MG tablet Take 1.5 tablets (150 mg total) by mouth daily.   spironolactone  (ALDACTONE ) 25 MG tablet Take 1 tablet (25 mg total) by mouth daily.   traMADol  HCl 100 MG TABS Take 1 tablet (100 mg) by mouth every 6 (six) hours as needed for pain   traZODone  (DESYREL ) 100 MG  tablet Take 1 tablet (100 mg total) by mouth at bedtime.   warfarin (COUMADIN ) 4 MG tablet Take 12 mg (3 tabs) every Tuesday and 8 mg (2 tabs) all other days or as directed by HF clinic   hydrocortisone  (ANUSOL -HC) 25 MG suppository Place 1 suppository (25 mg total) rectally at bedtime. (Patient not taking: Reported on 05/02/2024)   torsemide  (DEMADEX ) 20 MG tablet Take 1 tablet (20 mg total) by mouth as needed. (Patient not taking:  Reported on 05/02/2024)   No facility-administered encounter medications on file as of 05/08/2024.    Review of Systems  Review of Systems  Constitutional: Negative.   HENT: Negative.    Cardiovascular: Negative.   Gastrointestinal: Negative.   Allergic/Immunologic: Negative.   Neurological: Negative.   Psychiatric/Behavioral: Negative.       Objective:   BP 100/62   Pulse 70   Temp 98.2 F (36.8 C) (Oral)   Wt 283 lb 6.4 oz (128.5 kg)   SpO2 97%   BMI 42.46 kg/m   Wt Readings from Last 5 Encounters:  05/08/24 283 lb 6.4 oz (128.5 kg)  05/02/24 285 lb 6.4 oz (129.5 kg)  03/09/24 293 lb (132.9 kg)  01/27/24 (!) 303 lb 8 oz (137.7 kg)  01/06/24 (!) 307 lb 12.8 oz (139.6 kg)     Physical Exam Vitals and nursing note reviewed.  Constitutional:      General: He is not in acute distress.    Appearance: He is well-developed.  Cardiovascular:     Rate and Rhythm: Normal rate and regular rhythm.  Pulmonary:     Effort: Pulmonary effort is normal.     Breath sounds: Normal breath sounds.  Skin:    General: Skin is warm and dry.  Neurological:     Mental Status: He is alert and oriented to person, place, and time.       Assessment & Plan:   Type 2 diabetes mellitus with stage 3a chronic kidney disease, with long-term current use of insulin  (HCC) -     Microalbumin / creatinine urine ratio -     POCT glycosylated hemoglobin (Hb A1C)     Return in about 6 months (around 11/08/2024).   Bascom GORMAN Borer, NP 05/08/2024

## 2024-05-09 ENCOUNTER — Ambulatory Visit (HOSPITAL_COMMUNITY): Payer: Self-pay | Admitting: Family

## 2024-05-10 ENCOUNTER — Other Ambulatory Visit (HOSPITAL_COMMUNITY): Payer: Self-pay | Admitting: *Deleted

## 2024-05-10 DIAGNOSIS — Z95811 Presence of heart assist device: Secondary | ICD-10-CM

## 2024-05-10 DIAGNOSIS — Z7901 Long term (current) use of anticoagulants: Secondary | ICD-10-CM

## 2024-05-11 ENCOUNTER — Ambulatory Visit: Payer: Self-pay | Admitting: Nurse Practitioner

## 2024-05-11 LAB — MICROALBUMIN / CREATININE URINE RATIO
Creatinine, Urine: 175.7 mg/dL
Microalb/Creat Ratio: 521 mg/g{creat} — AB (ref 0–29)
Microalbumin, Urine: 915.4 ug/mL

## 2024-05-18 ENCOUNTER — Ambulatory Visit (HOSPITAL_COMMUNITY): Payer: Self-pay | Admitting: Pharmacist

## 2024-05-18 ENCOUNTER — Ambulatory Visit (HOSPITAL_COMMUNITY)
Admission: RE | Admit: 2024-05-18 | Discharge: 2024-05-18 | Disposition: A | Discharge status: Home / Self Care | Source: Ambulatory Visit | Attending: Cardiology | Admitting: Cardiology

## 2024-05-18 DIAGNOSIS — Z95811 Presence of heart assist device: Secondary | ICD-10-CM | POA: Diagnosis not present

## 2024-05-18 DIAGNOSIS — Z7901 Long term (current) use of anticoagulants: Secondary | ICD-10-CM | POA: Diagnosis not present

## 2024-05-18 LAB — PROTIME-INR
INR: 2.3 — ABNORMAL HIGH (ref 0.8–1.2)
Prothrombin Time: 26.1 s — ABNORMAL HIGH (ref 11.4–15.2)

## 2024-05-22 ENCOUNTER — Other Ambulatory Visit: Payer: Self-pay

## 2024-05-22 ENCOUNTER — Other Ambulatory Visit (HOSPITAL_COMMUNITY): Payer: Self-pay | Admitting: Internal Medicine

## 2024-05-22 DIAGNOSIS — Z95811 Presence of heart assist device: Secondary | ICD-10-CM

## 2024-05-22 DIAGNOSIS — I152 Hypertension secondary to endocrine disorders: Secondary | ICD-10-CM

## 2024-05-22 DIAGNOSIS — I5022 Chronic systolic (congestive) heart failure: Secondary | ICD-10-CM

## 2024-05-23 ENCOUNTER — Other Ambulatory Visit: Payer: Self-pay

## 2024-05-23 NOTE — Patient Instructions (Signed)
 Visit Information  [name] was given information about Medicaid Managed Care team care coordination services as a part of their [facility] Medicaid benefit. Jeffrey [name]   If you would like to schedule transportation through your Healthy [facility] plan, please call the following number at least 2 days in advance of your appointment: [phone]  For information about your ride after you set it up, call Ride Assist at [phone]. Use this number to activate a Will Call pickup, or if your transportation is late for a scheduled pickup. Use this number, too, if you need to make a change or cancel a previously scheduled reservation.  If you need transportation services right away, call [phone]. The after-hours call center is staffed 24 hours to handle ride assistance and urgent reservation requests (including discharges) 365 days a year. Urgent trips include sick visits, hospital discharge requests and life-sustaining treatment.  Call the [facility] Line at [phone], at any time, 24 hours a day, 7 days a week. If you are in danger or need immediate medical attention call 911.   Jeffrey [name] - following are the goals we discussed in your visit today:   Goals Addressed             This Visit's Progress    VBCI RN Care Plan-CHF   On track    Problems:  Chronic Disease Management support and education needs related to CHF  Goal: Over the next 30 days the Patient will attend all scheduled medical appointments: cardio, pharmacist as evidenced by completed visit notes uploaded to EMR verbalize basic understanding of CHF disease process and self health management plan as evidenced by verbal explanation lifestyle changes and consistent medication compliance   Interventions:   STOP LIGHT  Heart Failure (HF) Definition: When the heart muscle does not pump enough blood through the heart, leaving it very weak.   Some Heart Failure signs/symptoms:  Shortness of  breath with or without activities Weakness Swelling in your legs, ankles, feet, and/or abdomen Sudden weight gain from fluid retention Coughing or wheezing with possible mucus build up  Things you can do: Improve your quality of life by managing your stress levels, reducing the salt in your diet, exercise and lose weight if necessary.     Heart Failure Interventions: Advised patient to weigh each morning after emptying bladder Discussed importance of daily weight and advised patient to weigh and record daily Discussed the importance of keeping all appointments with provider Screening for signs and symptoms of depression related to chronic disease state  He continues to take his Ozempic  and has lost about 100 lbs.  Advised him to continue the good work to reach his goal. Encouraged him to reach out to a behavioral health provider on the list he was previously given for mental health support, especially as he begins the process of heart transplant  Patient Self-Care Activities:  Attend all scheduled provider appointments Call pharmacy for medication refills 3-7 days in advance of running out of medications Call provider office for new concerns or questions  Perform all self care activities independently  Take medications as prescribed   call office if I gain more than 2 pounds in one day or 5 pounds in one week track weight in diary watch for swelling in feet, ankles and legs every day weigh myself daily bring diary to all appointments follow rescue plan if symptoms flare-up  Plan:  Telephone follow up appointment with care management team member scheduled for: 06/20/24 at  2 PM              Patient verbalizes understanding of instructions and care plan provided today and agrees to view in MyChart. Active MyChart status and patient understanding of how to access instructions and care plan via MyChart confirmed with patient.     Telephone follow up appointment with Managed  Medicaid care management team member scheduled for: 06/20/24 at 2 PM  [name], RN MSN [place]  VBCI Population Health RN Care Manager Direct Dial: [phone]  Fax: [phone]   Following is a copy of your plan of care:  There are no care plans that you recently modified to display for this patient.

## 2024-05-23 NOTE — Patient Outreach (Signed)
 Complex Care Management   Visit Note  05/23/2024  Name:  Jeffrey [name] MRN: [id number] DOB: March 17, 1976  Situation: Referral received for Complex Care Management related to Heart Failure I obtained verbal consent from Patient.  Visit completed with Patient  on the phone  Background:   Past Medical History:  Diagnosis Date   Anxiety    Aspirin  allergy    Childhood asthma    Chronic systolic CHF (congestive heart failure) (HCC)    a. EF 20-25% in 2012. b. EF 45-50% in 10/2011 with nonischemic nuc - presumed NICM. c. 12/2014 Echo: Sev depressed LV fxn, sev dil LV, mild LVH, mild MR, sev dil LA, mildly reduced RV fxn.   CKD (chronic kidney disease) stage 2, GFR 60-89 ml/min    Depression    H/O vasectomy 12/2019   High cholesterol    Hypertension    Morbid obesity (HCC)    Nephrolithiasis    OSA on CPAP    Paroxysmal atrial fibrillation (HCC)    Presumed NICM    a. 04/2014 Myoview : EF 26%, glob HK, sev glob HK, ? prior infarct;  b. Never cathed 2/2 CKD.   Renal cell carcinoma (HCC)    a. s/p Rt robotic assisted partial converted to radical nephrectomy on 01/2013.   Troponin level elevated    a. 04/2014, 12/2014: felt due to CHF.   Type II diabetes mellitus (HCC)    Ventricular tachycardia (HCC)    a. appropriate ICD therapy 12/2017    Assessment: Patient Reported Symptoms:  Cognitive Cognitive Status: Able to follow simple commands, Alert and oriented to person, place, and time, Normal speech and language skills Cognitive/Intellectual Conditions Management [RPT]: None reported or documented in medical history or problem list      Neurological Neurological Review of Symptoms: No symptoms reported    HEENT HEENT Symptoms Reported: No symptoms reported      Cardiovascular Cardiovascular Symptoms Reported: No symptoms reported Does patient have uncontrolled Hypertension?: No (Confirmed patient does have BP monitor) Cardiovascular Management Strategies: Medical device, Medication  therapy, Weight management (AICD, LVAD) Do You Have a Working Readable Scale?: Yes Weight: 269 lb (122 kg) (Patient reported) Cardiovascular Comment: Note that patient is trying to lose weight for possible heart transplant. He has regular f/u cardiology  Respiratory Respiratory Symptoms Reported: No symptoms reported Respiratory Management Strategies: CPAP  Endocrine Endocrine Symptoms Reported: No symptoms reported Is patient diabetic?: Yes Is patient checking blood sugars at home?: Yes List most recent blood sugar readings, include date and time of day: 95 this morning    Gastrointestinal Gastrointestinal Symptoms Reported: No symptoms reported Additional Gastrointestinal Details: Patient reports his appetite is decreased since starting Ozempic . He has had about 100 lb weight loss since starting Ozempic . Last BM this morning.      Genitourinary Genitourinary Symptoms Reported: No symptoms reported Genitourinary Comment: s/p nephrectomy about 15 years ago per patient  Integumentary Integumentary Symptoms Reported: No symptoms reported    Musculoskeletal Musculoskelatal Symptoms Reviewed: No symptoms reported   Falls in the past year?: No Number of falls in past year: 1 or less Was there an injury with Fall?: No Fall Risk Category Calculator: 0 Patient Fall Risk Level: Low Fall Risk Patient at Risk for Falls Due to: No Fall Risks Fall risk Follow up: Falls evaluation completed, Education provided  Psychosocial Psychosocial Symptoms Reported: Depression - if selected complete PHQ 2-9 Behavioral Management Strategies: Support system Behavioral Health Comment: Patient reports he was scheduled to see a behavioral health  specialist but he thinks they had the wrong phone number because they were unable to get in touch with him, and therefore closed the referral. They sent him a list of other providers to reach out to. Encouraged patient to look over list of providers and reach out to  providers for mental health support. Behaviors When Feeling Stressed/Fearful: Talks to his sister/family members      05/23/2024    PHQ2-9 Depression Screening   Little interest or pleasure in doing things Several days  Feeling down, depressed, or hopeless Several days  PHQ-2 - Total Score 2  Trouble falling or staying asleep, or sleeping too much    Feeling tired or having little energy    Poor appetite or overeating     Feeling bad about yourself - or that you are a failure or have let yourself or your family down    Trouble concentrating on things, such as reading the newspaper or watching television    Moving or speaking so slowly that other people could have noticed.  Or the opposite - being so fidgety or restless that you have been moving around a lot more than usual    Thoughts that you would be better off dead, or hurting yourself in some way    PHQ2-9 Total Score    If you checked off any problems, how difficult have these problems made it for you to do your work, take care of things at home, or get along with other people    Depression Interventions/Treatment      There were no vitals filed for this visit.  Medications Reviewed Today     Reviewed by Arno Rosaline SQUIBB, RN (Registered Nurse) on 05/23/24 at 1402  Med List Status: <None>   Medication Order Taking? Sig Documenting Provider Last Dose Status Informant  acetaminophen  (TYLENOL ) 325 MG tablet [id number]  Take 2 tablets (650 mg total) by mouth every 6 (six) hours as needed for fever. Lenetta Greig BIRCH, NP  Active Self, Pharmacy Records           Med Note [facility]   Thu Mar 18, 2023  2:30 PM) PRN   allopurinol  (ZYLOPRIM ) 100 MG tablet [id number]  Take 1 tablet (100 mg total) by mouth daily. Bensimhon, Toribio SAUNDERS, MD  Active   amiodarone  (PACERONE ) 200 MG tablet [id number]  Take 1 tablet (200 mg total) by mouth daily. Bensimhon, Toribio SAUNDERS, MD  Active   amLODipine  (NORVASC ) 10 MG tablet [id number]  Take 1  tablet (10 mg total) by mouth daily Nichols, Tonya S, NP  Active   Blood Pressure KIT [id number]  1 Units by Does not apply route daily. Oley Bascom RAMAN, NP  Active   carvedilol  (COREG ) 3.125 MG tablet [id number]  Take 1 tablet (3.125 mg total) by mouth 2 (two) times daily with a meal. Zenaida Morene PARAS, MD  Active   cefadroxil  (DURICEF) 500 MG capsule [id number]  Take 1 capsule (500 mg total) by mouth 2 (two) times daily. Bensimhon, Toribio SAUNDERS, MD  Active   colchicine  0.6 MG tablet [id number]  Take 1 tablet (0.6 mg total) by mouth once daily. Bensimhon, Toribio SAUNDERS, MD  Active   cyanocobalamin  (VITAMIN B12) 1000 MCG tablet [id number]  Take 1 tablet (1,000 mcg total) by mouth daily. Oley Bascom RAMAN, NP  Active   cyclobenzaprine  (FLEXERIL ) 5 MG tablet [id number]  Take 1 tablet (5 mg total) by mouth 3 (three) times daily as needed for muscle spasms   Active  doxazosin  (CARDURA ) 4 MG tablet [id number]  Take 2 tablets (8 mg total) by mouth 2 (two) times daily. Bensimhon, Toribio SAUNDERS, MD  Active   empagliflozin  (JARDIANCE ) 10 MG TABS tablet [id number]  Take 1 tablet (10 mg total) by mouth daily before breakfast. Bensimhon, Toribio SAUNDERS, MD  Active   ferrous sulfate  (FEROSUL) 325 (65 FE) MG tablet [id number]  Take 1 tablet (325 mg total) by mouth daily before breakfast. Nichols, Tonya S, NP  Active   folic acid  (FOLVITE ) 800 MCG tablet [id number]  Take 0.5 tablets (400 mcg total) by mouth daily. Oley Bascom RAMAN, NP  Active   gabapentin  (NEURONTIN ) 400 MG capsule [id number]  Take 1 capsule (400 mg total) by mouth 3 (three) times daily. Bensimhon, Toribio SAUNDERS, MD  Active   glucose blood test strip [id number]  Use as directed 4 times daily. Shannan Sia I, NP  Active Self, Pharmacy Records  hydrocortisone  (ANUSOL -HC) 25 MG suppository [id number]  Place 1 suppository (25 mg total) rectally at bedtime.  Patient not taking: Reported on 05/02/2024   Kennedy-Smith, Colleen M, NP  Active   isosorbide -hydrALAZINE  (BIDIL ) 20-37.5 MG  tablet [id number]  Take 2 tablets by mouth 3 (three) times daily. Bensimhon, Toribio SAUNDERS, MD  Active   meclizine  (ANTIVERT ) 25 MG tablet [id number]  Take 1 tablet (25 mg total) by mouth 3 (three) times daily as needed for dizziness. Bensimhon, Toribio SAUNDERS, MD  Active Self, Pharmacy Records           Med Note [place], DOROTHYANN ONEIDA [place] May 05, 2023  9:08 AM) More dizziness/vertigo lately  ondansetron  (ZOFRAN -ODT) 4 MG disintegrating tablet [id number]  Take 1 tablet (4 mg total) by mouth every 8 (eight) hours as needed for nausea or vomiting. Zenaida Morene PARAS, MD  Active   pantoprazole  (PROTONIX ) 40 MG tablet [id number]  Take 1 tablet (40 mg total) by mouth once daily. Bensimhon, Toribio SAUNDERS, MD  Active   rosuvastatin  (CRESTOR ) 10 MG tablet [id number]  Take 1 tablet (10 mg total) by mouth daily. Bensimhon, Toribio SAUNDERS, MD  Active   sacubitril -valsartan  (ENTRESTO ) 97-103 MG [id number]  Take 1 tablet by mouth 2 (two) times daily. Bensimhon, Toribio SAUNDERS, MD  Active Self, Pharmacy Records  Semaglutide , 2 MG/DOSE, 8 MG/3ML SOPN [id number]  Inject 2 mg as directed once a week. Oley Bascom RAMAN, NP  Active   sertraline  (ZOLOFT ) 100 MG tablet [id number]  Take 1.5 tablets (150 mg total) by mouth daily. Bensimhon, Toribio SAUNDERS, MD  Active   spironolactone  (ALDACTONE ) 25 MG tablet [id number]  Take 1 tablet (25 mg total) by mouth daily. Bensimhon, Toribio SAUNDERS, MD  Active   torsemide  (DEMADEX ) 20 MG tablet [id number]  Take 1 tablet (20 mg total) by mouth as needed.  Patient not taking: Reported on 05/02/2024   Bensimhon, Toribio SAUNDERS, MD  Active   traMADol  HCl 100 MG TABS [id number]  Take 1 tablet (100 mg) by mouth every 6 (six) hours as needed for pain Bensimhon, Toribio SAUNDERS, MD  Active   traZODone  (DESYREL ) 100 MG tablet [id number]  Take 1 tablet (100 mg total) by mouth at bedtime. Bensimhon, Daniel R, MD  Active   warfarin (COUMADIN ) 4 MG tablet [id number]  Take 12 mg (3 tabs) every Tuesday and 8 mg (2 tabs) all other days or as directed by HF clinic  Bensimhon, Toribio SAUNDERS, MD  Active             Recommendation:   Specialty provider follow-up cardiology Continue Current  Plan of Care  Follow Up Plan:   Telephone follow up appointment date/time:  06/20/24 at 2 PM  [name], RN MSN [place]  [facility] RN Care Manager Direct Dial: [phone]  Fax: [phone]

## 2024-05-24 ENCOUNTER — Other Ambulatory Visit: Payer: Self-pay

## 2024-05-24 MED ORDER — GABAPENTIN 400 MG PO CAPS
400.0000 mg | ORAL_CAPSULE | Freq: Three times a day (TID) | ORAL | 6 refills | Status: DC
  Filled 2024-05-24: qty 30, 10d supply, fill #0
  Filled 2024-06-23: qty 30, 10d supply, fill #1
  Filled 2024-08-02: qty 30, 10d supply, fill #2

## 2024-05-30 ENCOUNTER — Other Ambulatory Visit: Payer: Self-pay

## 2024-05-31 ENCOUNTER — Other Ambulatory Visit: Payer: Self-pay

## 2024-05-31 ENCOUNTER — Other Ambulatory Visit (INDEPENDENT_AMBULATORY_CARE_PROVIDER_SITE_OTHER): Payer: Self-pay

## 2024-05-31 ENCOUNTER — Other Ambulatory Visit (HOSPITAL_COMMUNITY): Payer: Self-pay

## 2024-05-31 ENCOUNTER — Ambulatory Visit (INDEPENDENT_AMBULATORY_CARE_PROVIDER_SITE_OTHER): Payer: Medicaid Other

## 2024-05-31 DIAGNOSIS — I420 Dilated cardiomyopathy: Secondary | ICD-10-CM | POA: Diagnosis not present

## 2024-05-31 DIAGNOSIS — N1831 Chronic kidney disease, stage 3a: Secondary | ICD-10-CM

## 2024-05-31 DIAGNOSIS — Z794 Long term (current) use of insulin: Secondary | ICD-10-CM

## 2024-05-31 DIAGNOSIS — E1122 Type 2 diabetes mellitus with diabetic chronic kidney disease: Secondary | ICD-10-CM

## 2024-05-31 NOTE — Progress Notes (Signed)
 05/31/2024 Name: Jeffrey [name] MRN: [id number] DOB: [birth year]  Chief Complaint  Patient presents with   Diabetes    Jeffrey [name] is a 48 y.o. year old male who presented for a telephone visit.   They were referred to the pharmacist by their PCP for assistance in managing diabetes.   PMH significant for systolic heart failure due to NICM s/p LVAD, T2DM, obesity, CKD IIIa, R renal cell carcinoma s/p nephrectomy  Subjective: Patient was engaged by pharmacy on 02/29/24 for weight management. He was tolerating Ozempic  well, and was instructed to increase to 2 mg weekly. He was seen in advanced HF clinic on 03/09/24 by Dr. Zenaida. He reported NYHA class I HF symptoms and was euvolemic. He was initiated on carvedilol  3.125 mg BID. His BMI was 43.9 kg/m2, weight 293 lbs - down from 303 lbs ~1.5 months prior. At last pharmacy telephone appt on 04/24/24, patient reported tolerating Ozempic  2 mg weekly without issue. Weight continued to decrease to 285 lbs at HF clinic visit on 05/02/24. He was seen in person by PCP, Jeffrey [name], on 05/09/51. A1C continued to be controlled at 5.4% since stopping metformin .  Today, patient reports doing well. He continues to tolerate Ozempic  2 mg weekly. Patient reports that he has good supplies of his medications and has recently requested fills from [facility] pharmacy.   Care Team: Primary Care Provider: Borer Jeffrey RAMAN, NP ; Next Scheduled Visit: 11/08/24 Cardiologist: Dr. Bensimhon; Next Scheduled Visit: 07/21/24  Medication Access/Adherence  Current Pharmacy:  [facility] [address], [address] [place] [address] Phone: [phone] Fax: [phone]  [id number] - , [place] - [address] AT [facility] OF [address]  [place] [address] Phone: [phone] Fax: [phone]   Patient reports affordability concerns with their medications: No  Patient  reports access/transportation concerns to their pharmacy: No  Patient reports adherence concerns with their medications:  No     Diabetes:  Current medications: Ozempic  2 mg weekly, Jardiance  10 mg daily  Patient reports he is tolerating Ozempic  2 mg well. Denies GI side effects and has noticed his appetite decreasing this last dose increase. He has been weighing himself and has noticed slow/consistent weight loss.  Baseline weight (Feb 2025- restarted Ozempic ): 308.4 lbs (BMI 48.3 kg/m2) Most recent weight (05/02/24): 285 lbs (BMI 42.76 kg/m2)  OLD: 293 lbs (BMI 45.48 kg/m2)  Using glucometer; testing once daily Fasting BG: 85-100  Patient denies hypoglycemic s/sx including dizziness, shakiness, sweating. Patient denies hyperglycemic symptoms including polyuria, polydipsia, polyphagia, nocturia, neuropathy, blurred vision.  Current meal patterns: Usually has 2 meals per day + snacks.  - feels his appetite is getting smaller - Breakfast: blueberries, raspberries, apple, eggs - Supper: meat and vegetable - Snacks: has not been snacking as much - Drinks: mainly water , occasionally hot green tea (no sugar)  Current physical activity: He reports he is doing some physical activity, but not as much as he would like to. Reported walking and doing weight bearing exercise (occasional strength training).   Objective:  Lab Results  Component Value Date   HGBA1C 5.4 05/08/2024   HGBA1C 5.5 01/05/2024   HGBA1C 5.7 (A) 10/07/2023    Lab Results  Component Value Date   CREATININE 1.35 (H) 05/02/2024   BUN 21 (H) 05/02/2024   NA 137 05/02/2024   K 4.8 05/02/2024   CL 109 05/02/2024   CO2 19 (L) 05/02/2024  Lab Results  Component Value Date   CHOL 123 03/18/2023   HDL 46 03/18/2023   LDLCALC 53 03/18/2023   TRIG 138 03/18/2023   CHOLHDL 2.7 03/18/2023    Medications Reviewed Today   Medications were not reviewed in this encounter       Assessment/Plan:   Diabetes: -  Currently controlled with last A1C 5.4%, below goal <7%. His goal is to lose weight to be eligible for heart transplant - needs BMI ~35. His BMI has improved from 48 to 42 kg/m2 since restarting Ozempic  in Feb 2025.  Discussed the importance of lifestyle along with medication to help with weight loss. Appropriate to continue Ozempic  to 2 mg weekly for weight loss benefit as he is tolerating this well. His BG continued to be controlled since discontinuing metformin  for decreased renal function. - Reviewed long term cardiovascular and renal outcomes of uncontrolled blood sugar - Reviewed goal A1c, goal fasting, and goal 2 hour post prandial glucose - Reviewed dietary modifications including continuing to limit his portion sizes - Reviewed lifestyle modifications including: encouraged him to increase physical activity with the goal of walking for 60 minutes every day as he was previously - Recommend to continue Ozempic  to 2 mg weekly and Jardiance  10 mg daily. Noted after phone call ended that Jardiance  had not been filled at [facility] pharmacy since April 2025- requested fill today.  - Patient denies personal or family history of multiple endocrine neoplasia type 2, medullary thyroid  cancer; personal history of pancreatitis or gallbladder disease. - Recommend to check fasting blood glucose daily and notify clinic if FBG increases > 130 mg/dL after discontinuing metformin .    Noted after telephone visit ended that patient has not had Entresto  prescribed since Nov 2023 and it was not filled since Jan 2024. He was previously enrolled in [employer] PAP in 2017, but now has Medicaid and would no longer be eligible for this program. In Jan 2025 note with nephrology, patient reported he was no longer taking Entresto . Appears per recent Adv HF notes that patient should still be taking this medication. However, given recent soft BP - hesitant to instruct him to restart without further investigation. Will discuss with HF  pharmacist, Jeffrey [name], who follows patient for warfarin management.   Follow Up Plan: PharmD telephone 05/31/24   Lorain Baseman, PharmD [facility] [phone]

## 2024-06-01 ENCOUNTER — Other Ambulatory Visit: Payer: Self-pay

## 2024-06-01 LAB — CUP PACEART REMOTE DEVICE CHECK
Battery Remaining Percentage: 64 %
Battery Voltage: 64
Date Time Interrogation Session: 20250827112200
HighPow Impedance: 80 Ohm
Implantable Lead Connection Status: 753985
Implantable Lead Implant Date: 20160812
Implantable Lead Location: 753862
Implantable Lead Model: 3401
Implantable Pulse Generator Implant Date: 20210812
Pulse Gen Serial Number: 139514

## 2024-06-02 ENCOUNTER — Other Ambulatory Visit (HOSPITAL_COMMUNITY): Payer: Self-pay | Admitting: Unknown Physician Specialty

## 2024-06-02 ENCOUNTER — Other Ambulatory Visit: Payer: Self-pay

## 2024-06-02 DIAGNOSIS — Z95811 Presence of heart assist device: Secondary | ICD-10-CM

## 2024-06-02 DIAGNOSIS — Z7901 Long term (current) use of anticoagulants: Secondary | ICD-10-CM

## 2024-06-04 ENCOUNTER — Ambulatory Visit: Payer: Self-pay | Admitting: Cardiology

## 2024-06-06 ENCOUNTER — Other Ambulatory Visit: Payer: Self-pay

## 2024-06-07 ENCOUNTER — Ambulatory Visit (HOSPITAL_COMMUNITY): Payer: Self-pay | Admitting: Pharmacist

## 2024-06-07 ENCOUNTER — Other Ambulatory Visit (HOSPITAL_COMMUNITY)

## 2024-06-07 ENCOUNTER — Ambulatory Visit (HOSPITAL_COMMUNITY)
Admission: RE | Admit: 2024-06-07 | Discharge: 2024-06-07 | Disposition: A | Discharge status: Home / Self Care | Source: Ambulatory Visit | Attending: Cardiology | Admitting: Cardiology

## 2024-06-07 VITALS — BP 96/0

## 2024-06-07 DIAGNOSIS — Z95811 Presence of heart assist device: Secondary | ICD-10-CM | POA: Insufficient documentation

## 2024-06-07 DIAGNOSIS — Z7901 Long term (current) use of anticoagulants: Secondary | ICD-10-CM | POA: Insufficient documentation

## 2024-06-07 LAB — PROTIME-INR
INR: 4.2 (ref 0.8–1.2)
Prothrombin Time: 42.5 s — ABNORMAL HIGH (ref 11.4–15.2)

## 2024-06-07 NOTE — Progress Notes (Addendum)
 Received notification that pt had not picked up Entresto  prescription since January 2024- VAD team unaware that medication had been stopped/pt not taking.   Pt presented to clinic today for INR and BP check alone. Denies lightheadedness, dizziness, falls, shortness of breath, and signs of bleeding. Reports taking all medications as prescribed.   Currently taking Coreg  3.125 mg BID, Doxazosin  8 mg BID, Bidil  20-37.5 mg TID, and Amlodipine  10 mg daily. BP stable today. Will discontinue Entresto  from pt's medication list as he is no longer taking. Dr Cherrie updated on the above. At next clinic visit discuss restarting Entresto , and stopping Doxazosin  per Dr Bensimhon.   Vital Signs:  Doppler Pressure: 96 Automatic BP: 98/82 (89) HR: 82 NSR  Plan:  Coumadin  dosing per Lauren PharmD Return to clinic for previously scheduled appt  Isaiah Knoll RN VAD Coordinator  Office: [phone]  24/7 Pager: [phone]

## 2024-06-15 ENCOUNTER — Ambulatory Visit (HOSPITAL_COMMUNITY)
Admission: RE | Admit: 2024-06-15 | Discharge: 2024-06-15 | Disposition: A | Discharge status: Home / Self Care | Source: Ambulatory Visit | Attending: Cardiology | Admitting: Cardiology

## 2024-06-15 ENCOUNTER — Ambulatory Visit (HOSPITAL_COMMUNITY): Payer: Self-pay | Admitting: Pharmacist

## 2024-06-15 DIAGNOSIS — I5022 Chronic systolic (congestive) heart failure: Secondary | ICD-10-CM | POA: Diagnosis not present

## 2024-06-15 LAB — PROTIME-INR
INR: 3.6 — ABNORMAL HIGH (ref 0.8–1.2)
Prothrombin Time: 37.5 s — ABNORMAL HIGH (ref 11.4–15.2)

## 2024-06-19 NOTE — Progress Notes (Signed)
Remote ICD Transmission.

## 2024-06-20 ENCOUNTER — Other Ambulatory Visit: Payer: Self-pay

## 2024-06-20 NOTE — Patient Outreach (Signed)
 Complex Care Management   Visit Note  06/20/2024  Name:  Jeffrey [name] MRN: [id number] DOB: 07/10/1976  Situation: Referral received for Complex Care Management related to Heart Failure I obtained verbal consent from Patient.  Visit completed with Patient  on the phone  Background:   Past Medical History:  Diagnosis Date   Anxiety    Aspirin  allergy    Childhood asthma    Chronic systolic CHF (congestive heart failure) (HCC)    a. EF 20-25% in 2012. b. EF 45-50% in 10/2011 with nonischemic nuc - presumed NICM. c. 12/2014 Echo: Sev depressed LV fxn, sev dil LV, mild LVH, mild MR, sev dil LA, mildly reduced RV fxn.   CKD (chronic kidney disease) stage 2, GFR 60-89 ml/min    Depression    H/O vasectomy 12/2019   High cholesterol    Hypertension    Morbid obesity (HCC)    Nephrolithiasis    OSA on CPAP    Paroxysmal atrial fibrillation (HCC)    Presumed NICM    a. 04/2014 Myoview : EF 26%, glob HK, sev glob HK, ? prior infarct;  b. Never cathed 2/2 CKD.   Renal cell carcinoma (HCC)    a. s/p Rt robotic assisted partial converted to radical nephrectomy on 01/2013.   Troponin level elevated    a. 04/2014, 12/2014: felt due to CHF.   Type II diabetes mellitus (HCC)    Ventricular tachycardia (HCC)    a. appropriate ICD therapy 12/2017    Assessment: Patient Reported Symptoms:  Cognitive Cognitive Status: Able to follow simple commands, Alert and oriented to person, place, and time, Normal speech and language skills Cognitive/Intellectual Conditions Management [RPT]: None reported or documented in medical history or problem list      Neurological Neurological Review of Symptoms: Not assessed    HEENT HEENT Symptoms Reported: Not assessed      Cardiovascular Cardiovascular Symptoms Reported: No symptoms reported Does patient have uncontrolled Hypertension?: No Cardiovascular Management Strategies: Medical device, Medication therapy, Weight management (AICD, LVAD) Do You Have a  Working Readable Scale?: Yes Weight: 268 lb (121.6 kg) (Patient reported) Cardiovascular Comment: Reminded patient of cardiology follow-up appointment next month.  Respiratory Respiratory Symptoms Reported: No symptoms reported Respiratory Management Strategies: CPAP  Endocrine Endocrine Symptoms Reported: No symptoms reported Is patient diabetic?: Yes Is patient checking blood sugars at home?: Yes List most recent blood sugar readings, include date and time of day: 92 this morning    Gastrointestinal Gastrointestinal Symptoms Reported: No symptoms reported      Genitourinary Genitourinary Symptoms Reported: No symptoms reported    Integumentary Integumentary Symptoms Reported: Not assessed    Musculoskeletal Musculoskelatal Symptoms Reviewed: No symptoms reported   Falls in the past year?: No Number of falls in past year: 1 or less Was there an injury with Fall?: No Fall Risk Category Calculator: 0 Patient Fall Risk Level: Low Fall Risk Patient at Risk for Falls Due to: No Fall Risks Fall risk Follow up: Falls evaluation completed, Education provided  Psychosocial Psychosocial Symptoms Reported: Depression - if selected complete PHQ 2-9 Additional Psychological Details: Patient feels that depression has gotten a little better Behavioral Management Strategies: Support system Behavioral Health Comment: Patient reports he has not reached out to any behavioral health providers yet. He reports he has looked over the list, but has not determined who he wants to conatct.        06/20/2024    PHQ2-9 Depression Screening   Little interest or pleasure in  doing things Not at all  Feeling down, depressed, or hopeless Several days  PHQ-2 - Total Score 1  Trouble falling or staying asleep, or sleeping too much    Feeling tired or having little energy    Poor appetite or overeating     Feeling bad about yourself - or that you are a failure or have let yourself or your family down     Trouble concentrating on things, such as reading the newspaper or watching television    Moving or speaking so slowly that other people could have noticed.  Or the opposite - being so fidgety or restless that you have been moving around a lot more than usual    Thoughts that you would be better off dead, or hurting yourself in some way    PHQ2-9 Total Score    If you checked off any problems, how difficult have these problems made it for you to do your work, take care of things at home, or get along with other people    Depression Interventions/Treatment      There were no vitals filed for this visit.  Medications Reviewed Today     Reviewed by Arno Rosaline SQUIBB, RN (Registered Nurse) on 06/20/24 at 1409  Med List Status: <None>   Medication Order Taking? Sig Documenting Provider Last Dose Status Informant  acetaminophen  (TYLENOL ) 325 MG tablet [id number]  Take 2 tablets (650 mg total) by mouth every 6 (six) hours as needed for fever. Lenetta Greig BIRCH, NP  Active Self, Pharmacy Records           Med Note [facility]   Thu Mar 18, 2023  2:30 PM) PRN   allopurinol  (ZYLOPRIM ) 100 MG tablet [id number]  Take 1 tablet (100 mg total) by mouth daily. Bensimhon, Toribio SAUNDERS, MD  Active   amiodarone  (PACERONE ) 200 MG tablet [id number]  Take 1 tablet (200 mg total) by mouth daily. Bensimhon, Toribio SAUNDERS, MD  Active   amLODipine  (NORVASC ) 10 MG tablet [id number]  Take 1 tablet (10 mg total) by mouth daily Nichols, Tonya S, NP  Active   Blood Pressure KIT [id number]  1 Units by Does not apply route daily. Oley Bascom RAMAN, NP  Active   carvedilol  (COREG ) 3.125 MG tablet [id number]  Take 1 tablet (3.125 mg total) by mouth 2 (two) times daily with a meal. Zenaida Morene PARAS, MD  Active   cefadroxil  (DURICEF) 500 MG capsule [id number]  Take 1 capsule (500 mg total) by mouth 2 (two) times daily. Bensimhon, Toribio SAUNDERS, MD  Active   colchicine  0.6 MG tablet [id number]  Take 1 tablet (0.6 mg total) by mouth  once daily. Bensimhon, Toribio SAUNDERS, MD  Active   cyanocobalamin  (VITAMIN B12) 1000 MCG tablet [id number]  Take 1 tablet (1,000 mcg total) by mouth daily. Oley Bascom RAMAN, NP  Active   cyclobenzaprine  (FLEXERIL ) 5 MG tablet [id number]  Take 1 tablet (5 mg total) by mouth 3 (three) times daily as needed for muscle spasms   Active   doxazosin  (CARDURA ) 4 MG tablet [id number]  Take 2 tablets (8 mg total) by mouth 2 (two) times daily. Bensimhon, Toribio SAUNDERS, MD  Active   empagliflozin  (JARDIANCE ) 10 MG TABS tablet [id number]  Take 1 tablet (10 mg total) by mouth daily before breakfast. Bensimhon, Toribio SAUNDERS, MD  Active   ferrous sulfate  (FEROSUL) 325 (65 FE) MG tablet [id number]  Take 1 tablet (325 mg total) by mouth daily before breakfast. Oley Bascom RAMAN, NP  Active  folic acid  (FOLVITE ) 800 MCG tablet [id number]  Take 0.5 tablets (400 mcg total) by mouth daily. Oley Bascom RAMAN, NP  Active   gabapentin  (NEURONTIN ) 400 MG capsule [id number]  Take 1 capsule (400 mg total) by mouth 3 (three) times daily. Bensimhon, Toribio SAUNDERS, MD  Active   glucose blood test strip [id number]  Use as directed 4 times daily. Shannan Sia I, NP  Active Self, Pharmacy Records  hydrocortisone  (ANUSOL -HC) 25 MG suppository [id number]  Place 1 suppository (25 mg total) rectally at bedtime.  Patient not taking: Reported on 06/07/2024   Kennedy-Smith, Colleen M, NP  Active   isosorbide -hydrALAZINE  (BIDIL ) 20-37.5 MG tablet [id number]  Take 2 tablets by mouth 3 (three) times daily. Bensimhon, Toribio SAUNDERS, MD  Active   meclizine  (ANTIVERT ) 25 MG tablet [id number]  Take 1 tablet (25 mg total) by mouth 3 (three) times daily as needed for dizziness. Bensimhon, Toribio SAUNDERS, MD  Active Self, Pharmacy Records           Med Note Bodfish, DOROTHYANN ONEIDA [place] May 05, 2023  9:08 AM) More dizziness/vertigo lately  ondansetron  (ZOFRAN -ODT) 4 MG disintegrating tablet [id number]  Take 1 tablet (4 mg total) by mouth every 8 (eight) hours as needed for nausea or vomiting.  Zenaida Morene PARAS, MD  Active   pantoprazole  (PROTONIX ) 40 MG tablet [id number]  Take 1 tablet (40 mg total) by mouth once daily. Bensimhon, Toribio SAUNDERS, MD  Active   rosuvastatin  (CRESTOR ) 10 MG tablet [id number]  Take 1 tablet (10 mg total) by mouth daily. Bensimhon, Toribio SAUNDERS, MD  Active   sacubitril -valsartan  (ENTRESTO ) 97-103 MG [id number]  Take 1 tablet by mouth 2 (two) times daily.  Patient not taking: Reported on 06/07/2024   Bensimhon, Daniel R, MD  Active Self, Pharmacy Records  Semaglutide , 2 MG/DOSE, 8 MG/3ML SOPN [id number]  Inject 2 mg as directed once a week. Oley Bascom RAMAN, NP  Active   sertraline  (ZOLOFT ) 100 MG tablet [id number]  Take 1.5 tablets (150 mg total) by mouth daily. Bensimhon, Toribio SAUNDERS, MD  Active   spironolactone  (ALDACTONE ) 25 MG tablet [id number]  Take 1 tablet (25 mg total) by mouth daily. Bensimhon, Toribio SAUNDERS, MD  Active   torsemide  (DEMADEX ) 20 MG tablet [id number]  Take 1 tablet (20 mg total) by mouth as needed.  Patient not taking: Reported on 06/07/2024   Bensimhon, Toribio SAUNDERS, MD  Active   traMADol  HCl 100 MG TABS [id number]  Take 1 tablet (100 mg) by mouth every 6 (six) hours as needed for pain Bensimhon, Toribio SAUNDERS, MD  Active   traZODone  (DESYREL ) 100 MG tablet [id number]  Take 1 tablet (100 mg total) by mouth at bedtime. Bensimhon, Daniel R, MD  Active   warfarin (COUMADIN ) 4 MG tablet [id number]  Take 12 mg (3 tabs) every Tuesday and 8 mg (2 tabs) all other days or as directed by HF clinic Bensimhon, Toribio SAUNDERS, MD  Active             Recommendation:   Continue Current Plan of Care  Follow Up Plan:   Telephone follow up appointment date/time:  07/24/24 at 2 PM  [name], RN MSN [place]  [facility] RN Care Manager Direct Dial: [phone]  Fax: [phone]

## 2024-06-20 NOTE — Patient Instructions (Signed)
 Visit Information  [name] was given information about Medicaid Managed Care team care coordination services as a part of their Healthy [facility] Medicaid benefit. Jeffrey [name]   If you would like to schedule transportation through your Healthy [facility] plan, please call the following number at least 2 days in advance of your appointment: [phone]  For information about your ride after you set it up, call Ride Assist at [phone]. Use this number to activate a Will Call pickup, or if your transportation is late for a scheduled pickup. Use this number, too, if you need to make a change or cancel a previously scheduled reservation.  If you need transportation services right away, call [phone]. The after-hours call center is staffed 24 hours to handle ride assistance and urgent reservation requests (including discharges) 365 days a year. Urgent trips include sick visits, hospital discharge requests and life-sustaining treatment.  Call the [facility] Line at [phone], at any time, 24 hours a day, 7 days a week. If you are in danger or need immediate medical attention call 911.   [name] - following are the goals we discussed in your visit today:   Goals Addressed             This Visit's Progress    [facility]BC[facility] RN Care Plan-CHF   On track    Problems:  Chronic Disease Management support and education needs related to CHF  Goal: Over the next 30 days the Patient will attend all scheduled medical appointments: cardiology as evidenced by completed visit notes uploaded to EMR verbalize basic understanding of CHF disease process and self health management plan as evidenced by verbal explanation lifestyle changes and consistent medication compliance   Interventions:   Heart Failure Interventions: Discussed importance of daily weight and advised patient to weigh and record daily Discussed the importance of keeping all appointments with provider Screening for signs and  symptoms of depression related to chronic disease state  Ensured patient continues to take his Ozempic  and is making efforts towards weight loss.  Advised him to continue the good work to reach his goal Encouraged patient to reach out to a behavioral health provider on the list he was previously given for mental health support, especially as he begins the process of heart transplant  Patient Self-Care Activities:  Attend all scheduled provider appointments Call pharmacy for medication refills 3-7 days in advance of running out of medications Call provider office for new concerns or questions  Perform all self care activities independently  Take medications as prescribed   call office if I gain more than 2 pounds in one day or 5 pounds in one week track weight in diary watch for swelling in feet, ankles and legs every day weigh myself daily bring diary to all appointments follow rescue plan if symptoms flare-up  Plan:  Telephone follow up appointment with care management team member scheduled for: 07/24/24 at 2 PM              Patient verbalizes understanding of instructions and care plan provided today and agrees to view in MyChart. Active MyChart status and patient understanding of how to access instructions and care plan via MyChart confirmed with patient.     Telephone follow up appointment with Managed Medicaid care management team member scheduled for: 07/24/24 at 2 PM  [name], RN MSN [place]  [facility] RN Care Manager Direct Dial: [phone]  Fax: [phone]   Following is a copy of your  plan of care:  There are no care plans that you recently modified to display for this patient.

## 2024-06-23 ENCOUNTER — Other Ambulatory Visit: Payer: Self-pay

## 2024-06-23 ENCOUNTER — Other Ambulatory Visit (HOSPITAL_COMMUNITY): Payer: Self-pay | Admitting: *Deleted

## 2024-06-23 DIAGNOSIS — Z95811 Presence of heart assist device: Secondary | ICD-10-CM

## 2024-06-23 DIAGNOSIS — I5022 Chronic systolic (congestive) heart failure: Secondary | ICD-10-CM

## 2024-06-23 DIAGNOSIS — Z7901 Long term (current) use of anticoagulants: Secondary | ICD-10-CM

## 2024-06-28 ENCOUNTER — Other Ambulatory Visit (HOSPITAL_COMMUNITY): Payer: Self-pay | Admitting: Internal Medicine

## 2024-06-28 ENCOUNTER — Other Ambulatory Visit: Payer: Self-pay

## 2024-06-28 ENCOUNTER — Telehealth (HOSPITAL_COMMUNITY): Payer: Self-pay | Admitting: Unknown Physician Specialty

## 2024-06-28 DIAGNOSIS — I5023 Acute on chronic systolic (congestive) heart failure: Secondary | ICD-10-CM

## 2024-06-28 DIAGNOSIS — T827XXA Infection and inflammatory reaction due to other cardiac and vascular devices, implants and grafts, initial encounter: Secondary | ICD-10-CM

## 2024-06-28 DIAGNOSIS — Z95811 Presence of heart assist device: Secondary | ICD-10-CM

## 2024-06-28 DIAGNOSIS — Z7901 Long term (current) use of anticoagulants: Secondary | ICD-10-CM

## 2024-06-28 MED ORDER — MECLIZINE HCL 25 MG PO TABS
25.0000 mg | ORAL_TABLET | Freq: Three times a day (TID) | ORAL | 2 refills | Status: AC | PRN
  Filled 2024-06-28 – 2024-07-14 (×2): qty 90, 30d supply, fill #0

## 2024-06-28 NOTE — Telephone Encounter (Signed)
 Pt called the VAD office stating that he feels weak. Pt called earlier today wanting a refill on his Meclizine . He tells me that he feels dizzy on and off and is nauseous. Pt denies fever or cough of any kind. Pts VAD parameters; Flow 4, Speed 6300, PI 4.1, and Power 5. No alarms from VAD.  Pt encouraged to increase fluids. Appt made for sick visit in clinic tomorrow at 1pm w/DR Sabharwal. Pt informed that if he worsens at all or feels like he is going to pass out to go to the ED and page the VAD pager.  Lauraine Ip RN, BSN [facility]AD Coordinator 24/7 Pager [phone]

## 2024-06-29 ENCOUNTER — Other Ambulatory Visit (HOSPITAL_COMMUNITY)

## 2024-06-29 ENCOUNTER — Other Ambulatory Visit: Payer: Self-pay

## 2024-06-29 ENCOUNTER — Encounter (HOSPITAL_COMMUNITY): Payer: Self-pay | Admitting: Cardiology

## 2024-06-29 ENCOUNTER — Ambulatory Visit (HOSPITAL_BASED_OUTPATIENT_CLINIC_OR_DEPARTMENT_OTHER)
Admission: RE | Admit: 2024-06-29 | Discharge: 2024-06-29 | Disposition: A | Discharge status: Home / Self Care | Source: Ambulatory Visit | Attending: Cardiology | Admitting: Cardiology

## 2024-06-29 ENCOUNTER — Encounter (HOSPITAL_COMMUNITY): Payer: Self-pay

## 2024-06-29 ENCOUNTER — Inpatient Hospital Stay (HOSPITAL_COMMUNITY)
Admission: AD | Admit: 2024-06-29 | Discharge: 2024-07-08 | DRG: 378 | Disposition: A | Discharge status: Home / Self Care | Source: Ambulatory Visit | Attending: Internal Medicine | Admitting: Internal Medicine

## 2024-06-29 DIAGNOSIS — K922 Gastrointestinal hemorrhage, unspecified: Principal | ICD-10-CM | POA: Diagnosis present

## 2024-06-29 DIAGNOSIS — M109 Gout, unspecified: Secondary | ICD-10-CM | POA: Diagnosis present

## 2024-06-29 DIAGNOSIS — Z91030 Bee allergy status: Secondary | ICD-10-CM

## 2024-06-29 DIAGNOSIS — Z95811 Presence of heart assist device: Secondary | ICD-10-CM | POA: Insufficient documentation

## 2024-06-29 DIAGNOSIS — E1122 Type 2 diabetes mellitus with diabetic chronic kidney disease: Secondary | ICD-10-CM | POA: Diagnosis present

## 2024-06-29 DIAGNOSIS — I428 Other cardiomyopathies: Secondary | ICD-10-CM | POA: Diagnosis present

## 2024-06-29 DIAGNOSIS — Z952 Presence of prosthetic heart valve: Secondary | ICD-10-CM | POA: Diagnosis not present

## 2024-06-29 DIAGNOSIS — Z862 Personal history of diseases of the blood and blood-forming organs and certain disorders involving the immune mechanism: Secondary | ICD-10-CM | POA: Diagnosis not present

## 2024-06-29 DIAGNOSIS — G4733 Obstructive sleep apnea (adult) (pediatric): Secondary | ICD-10-CM | POA: Diagnosis present

## 2024-06-29 DIAGNOSIS — Z7901 Long term (current) use of anticoagulants: Secondary | ICD-10-CM | POA: Insufficient documentation

## 2024-06-29 DIAGNOSIS — Z951 Presence of aortocoronary bypass graft: Secondary | ICD-10-CM

## 2024-06-29 DIAGNOSIS — I130 Hypertensive heart and chronic kidney disease with heart failure and stage 1 through stage 4 chronic kidney disease, or unspecified chronic kidney disease: Secondary | ICD-10-CM | POA: Diagnosis present

## 2024-06-29 DIAGNOSIS — D62 Acute posthemorrhagic anemia: Secondary | ICD-10-CM | POA: Diagnosis not present

## 2024-06-29 DIAGNOSIS — Z833 Family history of diabetes mellitus: Secondary | ICD-10-CM | POA: Diagnosis not present

## 2024-06-29 DIAGNOSIS — K31811 Angiodysplasia of stomach and duodenum with bleeding: Principal | ICD-10-CM | POA: Diagnosis present

## 2024-06-29 DIAGNOSIS — E861 Hypovolemia: Secondary | ICD-10-CM | POA: Diagnosis present

## 2024-06-29 DIAGNOSIS — I480 Paroxysmal atrial fibrillation: Secondary | ICD-10-CM | POA: Diagnosis present

## 2024-06-29 DIAGNOSIS — D509 Iron deficiency anemia, unspecified: Secondary | ICD-10-CM | POA: Diagnosis not present

## 2024-06-29 DIAGNOSIS — F32A Depression, unspecified: Secondary | ICD-10-CM | POA: Diagnosis present

## 2024-06-29 DIAGNOSIS — D6832 Hemorrhagic disorder due to extrinsic circulating anticoagulants: Secondary | ICD-10-CM | POA: Diagnosis present

## 2024-06-29 DIAGNOSIS — Z6841 Body Mass Index (BMI) 40.0 and over, adult: Secondary | ICD-10-CM | POA: Diagnosis not present

## 2024-06-29 DIAGNOSIS — Z9889 Other specified postprocedural states: Secondary | ICD-10-CM

## 2024-06-29 DIAGNOSIS — T182XXA Foreign body in stomach, initial encounter: Secondary | ICD-10-CM | POA: Diagnosis not present

## 2024-06-29 DIAGNOSIS — Z79899 Other long term (current) drug therapy: Secondary | ICD-10-CM

## 2024-06-29 DIAGNOSIS — Z59868 Other specified financial insecurity: Secondary | ICD-10-CM

## 2024-06-29 DIAGNOSIS — N183 Chronic kidney disease, stage 3 unspecified: Secondary | ICD-10-CM | POA: Diagnosis not present

## 2024-06-29 DIAGNOSIS — N1832 Chronic kidney disease, stage 3b: Secondary | ICD-10-CM | POA: Diagnosis present

## 2024-06-29 DIAGNOSIS — Z7984 Long term (current) use of oral hypoglycemic drugs: Secondary | ICD-10-CM

## 2024-06-29 DIAGNOSIS — Z8249 Family history of ischemic heart disease and other diseases of the circulatory system: Secondary | ICD-10-CM | POA: Diagnosis not present

## 2024-06-29 DIAGNOSIS — Z85528 Personal history of other malignant neoplasm of kidney: Secondary | ICD-10-CM

## 2024-06-29 DIAGNOSIS — I5022 Chronic systolic (congestive) heart failure: Secondary | ICD-10-CM | POA: Diagnosis present

## 2024-06-29 DIAGNOSIS — R195 Other fecal abnormalities: Secondary | ICD-10-CM | POA: Diagnosis not present

## 2024-06-29 DIAGNOSIS — B3781 Candidal esophagitis: Secondary | ICD-10-CM | POA: Diagnosis present

## 2024-06-29 DIAGNOSIS — Z905 Acquired absence of kidney: Secondary | ICD-10-CM

## 2024-06-29 DIAGNOSIS — F419 Anxiety disorder, unspecified: Secondary | ICD-10-CM | POA: Diagnosis present

## 2024-06-29 DIAGNOSIS — I5020 Unspecified systolic (congestive) heart failure: Secondary | ICD-10-CM

## 2024-06-29 DIAGNOSIS — E7800 Pure hypercholesterolemia, unspecified: Secondary | ICD-10-CM | POA: Diagnosis present

## 2024-06-29 DIAGNOSIS — D68318 Other hemorrhagic disorder due to intrinsic circulating anticoagulants, antibodies, or inhibitors: Secondary | ICD-10-CM | POA: Diagnosis present

## 2024-06-29 DIAGNOSIS — R42 Dizziness and giddiness: Secondary | ICD-10-CM | POA: Diagnosis not present

## 2024-06-29 DIAGNOSIS — K5520 Angiodysplasia of colon without hemorrhage: Secondary | ICD-10-CM

## 2024-06-29 DIAGNOSIS — Z886 Allergy status to analgesic agent status: Secondary | ICD-10-CM

## 2024-06-29 DIAGNOSIS — K31819 Angiodysplasia of stomach and duodenum without bleeding: Secondary | ICD-10-CM | POA: Diagnosis not present

## 2024-06-29 DIAGNOSIS — Z7985 Long-term (current) use of injectable non-insulin antidiabetic drugs: Secondary | ICD-10-CM | POA: Diagnosis not present

## 2024-06-29 DIAGNOSIS — Z87891 Personal history of nicotine dependence: Secondary | ICD-10-CM

## 2024-06-29 DIAGNOSIS — K2941 Chronic atrophic gastritis with bleeding: Secondary | ICD-10-CM | POA: Diagnosis not present

## 2024-06-29 DIAGNOSIS — Z91018 Allergy to other foods: Secondary | ICD-10-CM

## 2024-06-29 DIAGNOSIS — K2901 Acute gastritis with bleeding: Secondary | ICD-10-CM | POA: Diagnosis not present

## 2024-06-29 DIAGNOSIS — R194 Change in bowel habit: Secondary | ICD-10-CM

## 2024-06-29 DIAGNOSIS — Z888 Allergy status to other drugs, medicaments and biological substances status: Secondary | ICD-10-CM

## 2024-06-29 DIAGNOSIS — I5023 Acute on chronic systolic (congestive) heart failure: Secondary | ICD-10-CM | POA: Diagnosis not present

## 2024-06-29 DIAGNOSIS — D649 Anemia, unspecified: Secondary | ICD-10-CM

## 2024-06-29 DIAGNOSIS — K254 Chronic or unspecified gastric ulcer with hemorrhage: Secondary | ICD-10-CM | POA: Diagnosis not present

## 2024-06-29 DIAGNOSIS — K2961 Other gastritis with bleeding: Secondary | ICD-10-CM | POA: Diagnosis not present

## 2024-06-29 DIAGNOSIS — K921 Melena: Secondary | ICD-10-CM | POA: Diagnosis not present

## 2024-06-29 LAB — LACTATE DEHYDROGENASE: LDH: 150 U/L (ref 98–192)

## 2024-06-29 LAB — IRON AND TIBC
Iron: 36 ug/dL — ABNORMAL LOW (ref 45–182)
Saturation Ratios: 9 % — ABNORMAL LOW (ref 17.9–39.5)
TIBC: 403 ug/dL (ref 250–450)
UIBC: 367 ug/dL

## 2024-06-29 LAB — CBC
HCT: 16.1 % — ABNORMAL LOW (ref 39.0–52.0)
Hemoglobin: 4.6 g/dL — CL (ref 13.0–17.0)
MCH: 30.1 pg (ref 26.0–34.0)
MCHC: 28.6 g/dL — ABNORMAL LOW (ref 30.0–36.0)
MCV: 105.2 fL — ABNORMAL HIGH (ref 80.0–100.0)
Platelets: 199 K/uL (ref 150–400)
RBC: 1.53 MIL/uL — ABNORMAL LOW (ref 4.22–5.81)
RDW: 21.2 % — ABNORMAL HIGH (ref 11.5–15.5)
WBC: 6.1 K/uL (ref 4.0–10.5)
nRBC: 3.1 % — ABNORMAL HIGH (ref 0.0–0.2)

## 2024-06-29 LAB — MRSA NEXT GEN BY PCR, NASAL: MRSA by PCR Next Gen: NOT DETECTED

## 2024-06-29 LAB — RETICULOCYTES
Immature Retic Fract: 42.3 % — ABNORMAL HIGH (ref 2.3–15.9)
RBC.: 1.4 MIL/uL — ABNORMAL LOW (ref 4.22–5.81)
Retic Count, Absolute: 283.5 K/uL — ABNORMAL HIGH (ref 19.0–186.0)
Retic Ct Pct: 20.3 % — ABNORMAL HIGH (ref 0.4–3.1)

## 2024-06-29 LAB — BASIC METABOLIC PANEL WITH GFR
Anion gap: 8 (ref 5–15)
BUN: 28 mg/dL — ABNORMAL HIGH (ref 6–20)
CO2: 21 mmol/L — ABNORMAL LOW (ref 22–32)
Calcium: 8.9 mg/dL (ref 8.9–10.3)
Chloride: 108 mmol/L (ref 98–111)
Creatinine, Ser: 1.8 mg/dL — ABNORMAL HIGH (ref 0.61–1.24)
GFR, Estimated: 46 mL/min — ABNORMAL LOW (ref >60)
Glucose, Bld: 145 mg/dL — ABNORMAL HIGH (ref 70–99)
Potassium: 4.2 mmol/L (ref 3.5–5.1)
Sodium: 137 mmol/L (ref 135–145)

## 2024-06-29 LAB — HIV ANTIBODY (ROUTINE TESTING W REFLEX)
HIV Screen 4th Generation wRfx: NONREACTIVE
HIV Screen 4th Generation wRfx: NONREACTIVE

## 2024-06-29 LAB — VITAMIN B12: Vitamin B-12: <150 pg/mL — ABNORMAL LOW (ref 180–914)

## 2024-06-29 LAB — PROTIME-INR
INR: 3.8 — ABNORMAL HIGH (ref 0.8–1.2)
Prothrombin Time: 38.8 s — ABNORMAL HIGH (ref 11.4–15.2)

## 2024-06-29 LAB — PREPARE RBC (CROSSMATCH)

## 2024-06-29 LAB — FOLATE: Folate: 13.6 ng/mL (ref >5.9)

## 2024-06-29 LAB — FERRITIN: Ferritin: 29 ng/mL (ref 24–336)

## 2024-06-29 MED ORDER — SERTRALINE HCL 25 MG PO TABS
150.0000 mg | ORAL_TABLET | Freq: Every day | ORAL | Status: DC
  Administered 2024-06-30 – 2024-07-08 (×9): 150 mg via ORAL
  Filled 2024-06-29 (×9): qty 2

## 2024-06-29 MED ORDER — ROSUVASTATIN CALCIUM 5 MG PO TABS
10.0000 mg | ORAL_TABLET | Freq: Every day | ORAL | Status: DC
  Administered 2024-06-30 – 2024-07-08 (×9): 10 mg via ORAL
  Filled 2024-06-29 (×9): qty 2

## 2024-06-29 MED ORDER — ACETAMINOPHEN 325 MG PO TABS: 650.0000 mg | ORAL_TABLET | Freq: Four times a day (QID) | ORAL | Status: DC | PRN

## 2024-06-29 MED ORDER — COLCHICINE 0.6 MG PO TABS
0.6000 mg | ORAL_TABLET | Freq: Every day | ORAL | Status: DC
  Administered 2024-06-30 – 2024-07-08 (×9): 0.6 mg via ORAL
  Filled 2024-06-29 (×9): qty 1

## 2024-06-29 MED ORDER — CEFADROXIL 500 MG PO CAPS
500.0000 mg | ORAL_CAPSULE | Freq: Two times a day (BID) | ORAL | Status: DC
Start: 2024-06-29 — End: 2024-07-08
  Administered 2024-06-29 – 2024-07-08 (×18): 500 mg via ORAL
  Filled 2024-06-29 (×20): qty 1

## 2024-06-29 MED ORDER — TRAZODONE HCL 50 MG PO TABS
100.0000 mg | ORAL_TABLET | Freq: Every day | ORAL | Status: DC
Start: 2024-06-29 — End: 2024-07-08
  Administered 2024-06-29 – 2024-07-07 (×9): 100 mg via ORAL
  Filled 2024-06-29 (×9): qty 2

## 2024-06-29 MED ORDER — FERROUS SULFATE 325 (65 FE) MG PO TABS
325.0000 mg | ORAL_TABLET | Freq: Every day | ORAL | Status: DC
Start: 2024-06-30 — End: 2024-06-29

## 2024-06-29 MED ORDER — VITAMIN B-12 1000 MCG PO TABS
1000.0000 ug | ORAL_TABLET | Freq: Every day | ORAL | Status: DC
  Administered 2024-06-30 – 2024-07-08 (×9): 1000 ug via ORAL
  Filled 2024-06-29 (×9): qty 1

## 2024-06-29 MED ORDER — ALLOPURINOL 100 MG PO TABS
100.0000 mg | ORAL_TABLET | Freq: Every day | ORAL | Status: DC
  Administered 2024-06-30 – 2024-07-08 (×9): 100 mg via ORAL
  Filled 2024-06-29 (×9): qty 1

## 2024-06-29 MED ORDER — AMIODARONE HCL 200 MG PO TABS
200.0000 mg | ORAL_TABLET | Freq: Every day | ORAL | Status: DC
  Administered 2024-06-30 – 2024-07-08 (×9): 200 mg via ORAL
  Filled 2024-06-29 (×9): qty 1

## 2024-06-29 MED ORDER — FOLIC ACID 1 MG PO TABS
500.0000 ug | ORAL_TABLET | Freq: Every day | ORAL | Status: DC
  Administered 2024-06-30 – 2024-07-08 (×9): 0.5 mg via ORAL
  Filled 2024-06-29 (×9): qty 1

## 2024-06-29 MED ORDER — ACETAMINOPHEN 325 MG PO TABS: 650.0000 mg | ORAL_TABLET | ORAL | Status: DC | PRN

## 2024-06-29 MED ORDER — GABAPENTIN 400 MG PO CAPS
400.0000 mg | ORAL_CAPSULE | Freq: Three times a day (TID) | ORAL | Status: DC
Start: 2024-06-29 — End: 2024-07-08
  Administered 2024-06-29 – 2024-07-08 (×27): 400 mg via ORAL
  Filled 2024-06-29 (×27): qty 1

## 2024-06-29 MED ORDER — PANTOPRAZOLE SODIUM 40 MG IV SOLR
40.0000 mg | Freq: Two times a day (BID) | INTRAVENOUS | Status: DC
Start: 2024-06-29 — End: 2024-07-05
  Administered 2024-06-30 – 2024-07-05 (×12): 40 mg via INTRAVENOUS
  Filled 2024-06-29 (×12): qty 10

## 2024-06-29 MED ORDER — SODIUM CHLORIDE 0.9% IV SOLUTION: Freq: Once | INTRAVENOUS | Status: AC

## 2024-06-29 NOTE — H&P (Addendum)
 Advanced Heart Failure VAD History and Physical Note   PCP-Cardiologist: Toribio Fuel, MD   Reason for Admission: GIB  HPI:    Jeffrey [name] is a 48 y.o. yo male with a history of  poorly controlled HTN, R renal cell carcinoma s/p nephrectomy, CKD IIIa, DM2, OSA, gout, morbid obesity and systolic HF due to NICM. 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 D/c'd home with plan for 6 weeks IV Ancef  and transitioned to cefadroxil  for long-term suppression on 09/28/22.    RAMP echo 7/25. Minimal aortic valve opening, dilated LVIDd with improvement in septal geometry and RVOT VTI with increase in speed to 6300. Also, RV only mildly decreased function.   Presented today to LVAD clinic with dizziness, weakness and fatigue. Reports symptoms ongoing for ~ 1 week. Noticed darker stools for about 2-3 days. Has been increasing his leafy green intake recently. Admission labs reviewed: SCr 1.8, K 4.2, LDH 150, Hgb 4.6, INR 3.8. Has had decreased fluid intake over the last 2 days.   Orthostatic VS in clinic:  Lying: 90/78 (83) 68 bpm Sitting: 84/64 (71) 74 bpm Standing: 74/60 (66) 84 bpm  Plan to admit for further workup and GI consult. Will order 3uPRBCs for now.   LVAD INTERROGATION:  HeartMate III LVAD:  Flow 4.1 liters/min, speed 6300, power 5, PI 4.1.  ~20 PI events over the last 2 days  Home Medications Prior to Admission medications   Medication Sig Start Date End Date Taking? Authorizing Provider  acetaminophen  (TYLENOL ) 325 MG tablet Take 2 tablets (650 mg total) by mouth every 6 (six) hours as needed for fever. 05/22/21   Clegg, Amy D, NP  allopurinol  (ZYLOPRIM ) 100 MG tablet Take 1 tablet (100 mg total) by mouth daily. 10/26/23   Bensimhon, Toribio SAUNDERS, MD  amiodarone  (PACERONE ) 200 MG tablet Take 1 tablet (200 mg total) by mouth daily. 07/02/23   Bensimhon, Toribio SAUNDERS, MD  amLODipine  (NORVASC ) 10 MG tablet  Take 1 tablet (10 mg total) by mouth daily 03/17/24 10/13/24  Nichols, Tonya S, NP  Blood Pressure KIT 1 Units by Does not apply route daily. 06/30/23   Oley Bascom RAMAN, NP  carvedilol  (COREG ) 3.125 MG tablet Take 1 tablet (3.125 mg total) by mouth 2 (two) times daily with a meal. 03/09/24   Zenaida Morene PARAS, MD  cefadroxil  (DURICEF) 500 MG capsule Take 1 capsule (500 mg total) by mouth 2 (two) times daily. 06/17/23   Bensimhon, Toribio SAUNDERS, MD  colchicine  0.6 MG tablet Take 1 tablet (0.6 mg total) by mouth once daily. 02/17/24   Bensimhon, Toribio SAUNDERS, MD  cyanocobalamin  (VITAMIN B12) 1000 MCG tablet Take 1 tablet (1,000 mcg total) by mouth daily. 06/30/23   Oley Bascom RAMAN, NP  cyclobenzaprine  (FLEXERIL ) 5 MG tablet Take 1 tablet (5 mg total) by mouth 3 (three) times daily as needed for muscle spasms Patient not taking: Reported on 06/29/2024 09/18/23     doxazosin  (CARDURA ) 4 MG tablet Take 2 tablets (8 mg total) by mouth 2 (two) times daily. 09/01/23   Bensimhon, Toribio SAUNDERS, MD  empagliflozin  (JARDIANCE ) 10 MG TABS tablet Take 1 tablet (10 mg total) by mouth daily before breakfast. 11/15/23   Bensimhon, Toribio SAUNDERS, MD  ferrous sulfate  (FEROSUL) 325 (65 FE) MG tablet Take 1 tablet (325 mg total) by mouth daily before breakfast. 01/19/24   Oley Bascom RAMAN, NP  folic acid  (FOLVITE ) 800  MCG tablet Take 0.5 tablets (400 mcg total) by mouth daily. 06/30/23   Oley Bascom RAMAN, NP  gabapentin  (NEURONTIN ) 400 MG capsule Take 1 capsule (400 mg total) by mouth 3 (three) times daily. 05/24/24   Bensimhon, Toribio SAUNDERS, MD  glucose blood test strip Use as directed 4 times daily. 08/25/21   Passmore, Christain I, NP  hydrocortisone  (ANUSOL -HC) 25 MG suppository Place 1 suppository (25 mg total) rectally at bedtime. Patient not taking: Reported on 06/29/2024 01/27/24   Kennedy-Smith, Colleen M, NP  isosorbide -hydrALAZINE  (BIDIL ) 20-37.5 MG tablet Take 2 tablets by mouth 3 (three) times daily. 01/19/24   Bensimhon, Toribio SAUNDERS, MD  meclizine   (ANTIVERT ) 25 MG tablet Take 1 tablet (25 mg total) by mouth 3 (three) times daily as needed for dizziness. 06/28/24   Bensimhon, Toribio SAUNDERS, MD  ondansetron  (ZOFRAN -ODT) 4 MG disintegrating tablet Take 1 tablet (4 mg total) by mouth every 8 (eight) hours as needed for nausea or vomiting. Patient not taking: Reported on 06/29/2024 10/21/23   Zenaida Morene PARAS, MD  pantoprazole  (PROTONIX ) 40 MG tablet Take 1 tablet (40 mg total) by mouth once daily. 08/26/23   Bensimhon, Toribio SAUNDERS, MD  rosuvastatin  (CRESTOR ) 10 MG tablet Take 1 tablet (10 mg total) by mouth daily. 02/17/24   Bensimhon, Toribio SAUNDERS, MD  sacubitril -valsartan  (ENTRESTO ) 97-103 MG Take 1 tablet by mouth 2 (two) times daily. Patient not taking: Reported on 06/29/2024 08/05/22   Bensimhon, Toribio SAUNDERS, MD  Semaglutide , 2 MG/DOSE, 8 MG/3ML SOPN Inject 2 mg as directed once a week. 02/29/24   Oley Bascom RAMAN, NP  sertraline  (ZOLOFT ) 100 MG tablet Take 1.5 tablets (150 mg total) by mouth daily. 04/19/24   Bensimhon, Toribio SAUNDERS, MD  spironolactone  (ALDACTONE ) 25 MG tablet Take 1 tablet (25 mg total) by mouth daily. 06/17/23 07/23/24  Bensimhon, Toribio SAUNDERS, MD  torsemide  (DEMADEX ) 20 MG tablet Take 1 tablet (20 mg total) by mouth as needed. Patient not taking: Reported on 06/29/2024 11/12/23   Bensimhon, Toribio SAUNDERS, MD  traMADol  HCl 100 MG TABS Take 1 tablet (100 mg) by mouth every 6 (six) hours as needed for pain 09/14/22   Bensimhon, Toribio SAUNDERS, MD  traZODone  (DESYREL ) 100 MG tablet Take 1 tablet (100 mg total) by mouth at bedtime. 08/26/23   Bensimhon, Toribio SAUNDERS, MD  warfarin (COUMADIN ) 4 MG tablet Take 12 mg (3 tabs) every Tuesday and 8 mg (2 tabs) all other days or as directed by HF clinic 12/24/23   Bensimhon, Toribio SAUNDERS, MD    Past Medical History: Past Medical History:  Diagnosis Date   Anxiety    Aspirin  allergy    Childhood asthma    Chronic systolic CHF (congestive heart failure) (HCC)    a. EF 20-25% in 2012. b. EF 45-50% in 10/2011 with nonischemic nuc -  presumed NICM. c. 12/2014 Echo: Sev depressed LV fxn, sev dil LV, mild LVH, mild MR, sev dil LA, mildly reduced RV fxn.   CKD (chronic kidney disease) stage 2, GFR 60-89 ml/min    Depression    H/O vasectomy 12/2019   High cholesterol    Hypertension    Morbid obesity (HCC)    Nephrolithiasis    OSA on CPAP    Paroxysmal atrial fibrillation (HCC)    Presumed NICM    a. 04/2014 Myoview : EF 26%, glob HK, sev glob HK, ? prior infarct;  b. Never cathed 2/2 CKD.   Renal cell carcinoma (HCC)    a. s/p Rt robotic assisted  partial converted to radical nephrectomy on 01/2013.   Troponin level elevated    a. 04/2014, 12/2014: felt due to CHF.   Type II diabetes mellitus (HCC)    Ventricular tachycardia (HCC)    a. appropriate ICD therapy 12/2017    Past Surgical History: Past Surgical History:  Procedure Laterality Date   APPENDECTOMY  07/2004   APPLICATION OF WOUND VAC N/A 08/12/2022   Procedure: APPLICATION OF WOUND VAC;  Surgeon: Obadiah Coy, MD;  Location: [facility] OR;  Service: Thoracic;  Laterality: N/A;   APPLICATION OF WOUND VAC N/A 08/18/2022   Procedure: WOUND VAC CHANGE;  Surgeon: Obadiah Coy, MD;  Location: [facility] OR;  Service: Thoracic;  Laterality: N/A;   BIOPSY  02/10/2021   Procedure: BIOPSY;  Surgeon: Avram Lupita BRAVO, MD;  Location: [facility] ENDOSCOPY;  Service: Endoscopy;;   CARDIAC CATHETERIZATION N/A 05/17/2015   Procedure: Right/Left Heart Cath and Coronary Angiography;  Surgeon: Toribio JONELLE Fuel, MD;  Location: [facility] INVASIVE CV LAB;  Service: Cardiovascular;  Laterality: N/A;   CARDIOVERSION N/A 04/30/2021   Procedure: CARDIOVERSION;  Surgeon: Rolan Ezra RAMAN, MD;  Location: [facility] ENDOSCOPY;  Service: Cardiovascular;  Laterality: N/A;   CARDIOVERSION N/A 07/27/2021   Procedure: CARDIOVERSION;  Surgeon: Fuel Toribio JONELLE, MD;  Location: [facility] OR;  Service: Cardiovascular;  Laterality: N/A;   COLONOSCOPY WITH PROPOFOL  N/A 02/10/2021   Procedure: COLONOSCOPY WITH PROPOFOL ;  Surgeon: Avram Lupita BRAVO, MD;  Location: [facility] ENDOSCOPY;  Service: Endoscopy;  Laterality: N/A;   EP IMPLANTABLE DEVICE N/A 05/17/2015   Procedure: SubQ ICD Implant;  Surgeon: Will Gladis Norton, MD;  Location: [facility] INVASIVE CV LAB;  Service: Cardiovascular;  Laterality: N/A;   ESOPHAGOGASTRODUODENOSCOPY (EGD) WITH PROPOFOL  N/A 02/10/2021   Procedure: ESOPHAGOGASTRODUODENOSCOPY (EGD) WITH PROPOFOL ;  Surgeon: Avram Lupita BRAVO, MD;  Location: [facility] ENDOSCOPY;  Service: Endoscopy;  Laterality: N/A;   INSERTION OF IMPLANTABLE LEFT VENTRICULAR ASSIST DEVICE N/A 07/21/2021   Procedure: INSERTION OF IMPLANTABLE LEFT VENTRICULAR ASSIST DEVICE;  Surgeon: Lucas Dorise POUR, MD;  Location: [facility] OR;  Service: Open Heart Surgery;  Laterality: N/A;   RIGHT HEART CATH N/A 05/13/2021   Procedure: RIGHT HEART CATH;  Surgeon: Fuel Toribio JONELLE, MD;  Location: [facility] INVASIVE CV LAB;  Service: Cardiovascular;  Laterality: N/A;   RIGHT HEART CATH N/A 07/18/2021   Procedure: RIGHT HEART CATH;  Surgeon: Fuel Toribio JONELLE, MD;  Location: [facility] INVASIVE CV LAB;  Service: Cardiovascular;  Laterality: N/A;   ROBOTIC ASSISTED LAPAROSCOPIC LYSIS OF ADHESION  01/13/2013   Procedure: ROBOTIC ASSISTED LAPAROSCOPIC LYSIS OF ADHESION EXTENSIVE;  Surgeon: Ricardo Likens, MD;  Location: [facility] ORS;  Service: Urology;;   ROBOTIC ASSITED PARTIAL NEPHRECTOMY Right 01/13/2013   Procedure: ROBOTIC ASSITED PARTIAL NEPHRECTOMY CONVERTED TO ROBOTIC ASSISTED RIGHT RADICAL NEPHRECTOMY;  Surgeon: Ricardo Likens, MD;  Location: [facility] ORS;  Service: Urology;  Laterality: Right;   STERNAL WOUND DEBRIDEMENT N/A 08/12/2022   Procedure: ABDOMINAL WOUND DEBRIDEMENT;  Surgeon: Obadiah Coy, MD;  Location: [facility] OR;  Service: Thoracic;  Laterality: N/A;   STERNAL WOUND DEBRIDEMENT N/A 08/18/2022   Procedure: ABDOMINAL WOUND DEBRIDEMENT;  Surgeon: Obadiah Coy, MD;  Location: [facility] OR;  Service: Thoracic;  Laterality: N/A;   SUBQ ICD CHANGEOUT N/A 05/16/2020   Procedure: DLAV ICD CHANGEOUT;   Surgeon: Cindie Ole DASEN, MD;  Location: [facility] INVASIVE CV LAB;  Service: Cardiovascular;  Laterality: N/A;   SUBQ ICD CHANGEOUT N/A 05/15/2020   Procedure: SUBQ ICD CHANGEOUT;  Surgeon: Fernande Elspeth BROCKS, MD;  Location: [facility] INVASIVE CV LAB;  Service: Cardiovascular;  Laterality: N/A;   TEE WITHOUT CARDIOVERSION N/A 04/30/2021   Procedure: TRANSESOPHAGEAL ECHOCARDIOGRAM (TEE);  Surgeon: Rolan Ezra RAMAN, MD;  Location: [facility] ENDOSCOPY;  Service: Cardiovascular;  Laterality: N/A;   TEE WITHOUT CARDIOVERSION N/A 07/21/2021   Procedure: TRANSESOPHAGEAL ECHOCARDIOGRAM (TEE);  Surgeon: Lucas Dorise POUR, MD;  Location: [facility] OR;  Service: Open Heart Surgery;  Laterality: N/A;   VASECTOMY      Family History: Family History  Problem Relation Age of Onset   Hypertension Mother    Cancer Maternal Grandfather        type unknown   Diabetes Maternal Grandfather    Heart disease Maternal Grandfather    Diabetes Maternal Aunt    Heart attack Neg Hx    Stroke Neg Hx    Colon cancer Neg Hx    Pancreatic cancer Neg Hx    Stomach cancer Neg Hx    Liver cancer Neg Hx    Esophageal cancer Neg Hx     Social History: Social History   Socioeconomic History   Marital status: Married    Spouse name: Johnavon Mcclafferty   Number of children: 14   Years of education: Not on file   Highest education level: Not on file  Occupational History   Not on file  Tobacco Use   Smoking status: Former    Current packs/day: 0.00    Average packs/day: 0.3 packs/day for 22.0 years (5.5 ttl pk-yrs)    Types: Cigarettes    Start date: 04/29/1993    Quit date: 04/30/2015    Years since quitting: 9.1   Smokeless tobacco: Never  Vaping Use   Vaping status: Never Used  Substance and Sexual Activity   Alcohol use: Yes    Comment: occasional- weekends   Drug use: Yes    Types: Marijuana   Sexual activity: Not Currently    Birth control/protection: Condom  Other Topics Concern   Not on file  Social History Narrative   Married -  14 kids 7 boys 7 girls many are adults, 2 young children with current wife   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   Social Drivers of Health   Financial Resource Strain: Medium Risk (09/08/2023)   Overall Financial Resource Strain (CARDIA)    Difficulty of Paying Living Expenses: Somewhat hard  Food Insecurity: No Food Insecurity (04/12/2024)   Hunger Vital Sign    Worried About Running Out of Food in the Last Year: Never true    Ran Out of Food in the Last Year: Never true  Transportation Needs: No Transportation Needs (04/12/2024)   PRAPARE - Administrator, Civil Service (Medical): No    Lack of Transportation (Non-Medical): No  Physical Activity: Sufficiently Active (10/18/2023)   Exercise Vital Sign    Days of Exercise per Week: 7 days    Minutes of Exercise per Session: 50 min  Stress: Stress Concern Present (09/08/2023)   [employer] of Occupational Health - Occupational Stress Questionnaire    Feeling of Stress : To some extent  Social Connections: Socially Integrated (02/15/2023)   Social Connection and Isolation Panel    Frequency of Communication with Friends and  Family: More than three times a week    Frequency of Social Gatherings with Friends and Family: More than three times a week    Attends Religious Services: 1 to 4 times per year    Active Member of [employer] or Organizations: Yes    Attends Banker Meetings: Never    Marital Status: Married    Allergies:  Allergies  Allergen Reactions   Aspirin  Shortness Of Breath, Itching and Rash     Burning sensation (Patient reports he tolerates other NSAIDS)    Bee Venom Hives and Swelling   Lisinopril   Cough and Other (See Comments)   Tomato Hives    Objective:    Vital Signs:       There were no vitals filed for this visit.  Mean arterial Pressure pending, see orthostatics above for now  Physical Exam   General:  well appearing.  No respiratory difficulty Neck: JVD ~7 cm.  Cor: Regular rate & rhythm. No murmurs. Lungs: clear Extremities: no edema  Neuro: alert & oriented x 3. Affect pleasant.  Telemetry   Not yet on tele  EKG   update  Labs    Basic Metabolic Panel: Recent Labs  Lab 06/29/24 1251  NA 137  K 4.2  CL 108  CO2 21*  GLUCOSE 145*  BUN 28*  CREATININE 1.80*  CALCIUM  8.9    Liver Function Tests: No results for input(s): AST, ALT, ALKPHOS, BILITOT, PROT, ALBUMIN  in the last 168 hours. No results for input(s): LIPASE, AMYLASE in the last 168 hours. No results for input(s): AMMONIA in the last 168 hours.  CBC: Recent Labs  Lab 06/29/24 1251  WBC 6.1  HGB 4.6*  HCT 16.1*  MCV 105.2*  PLT 199    Cardiac Enzymes: No results for input(s): CKTOTAL, CKMB, CKMBINDEX, TROPONINI in the last 168 hours.  BNP: BNP (last 3 results) No results for input(s): BNP in the last 8760 hours.  ProBNP (last 3 results) No results for input(s): PROBNP in the last 8760 hours.   CBG: No results for input(s): GLUCAP in the last 168 hours.  Coagulation Studies: Recent Labs    06/29/24 1251  LABPROT 38.8*  INR 3.8*   Imaging    No results found.  Patient Profile:   Jeffrey [name] is a 48 y.o. yo male with a history of  poorly controlled HTN, R renal cell carcinoma s/p nephrectomy, CKD IIIa, DM2, OSA, gout, morbid obesity and systolic HF due to NICM. 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 Down to 4.6 today - Denies BRB or abnormal bleeding, only dark stools  - INR 3.8 - GI consulted, plan to see once in room - Check hemoccult - will order 3uPRBCs for now - Follow H&H - Start  protonix  40 IV BID - Hold warfarin  2. Chronic systolic CHF - Nonischemic CM, end-stage - EF 2012; 20-25%  - Echo  8/22: EF < 20%, severe LV dilation, moderate RV dysfunction.   - RHC (8/22): well compensated hemodynamics. - Echo 04/2024: Mild RV dysfunction, LVEF <20%, severe LV dilation - s/p S-ICD - S/p HM3 LVAD 07/21/21 (DT) - NYHA I-II - Holding bidil , coreg , spironolactone  and amlodipine  with dizziness, GIB and borderline orthostasis - Aware of need to get to BMI ~35 to permit transplant  Body mass index is 42.76 kg/m. Now back on Ozempic . Significant weight loss and steadily improving - Last Ramp echo 7/25: speed 6300  3. VAD  - s/p HM-3 placement 07/21/21  - VAD interrogated as above - INR 3.8 Goal INR 2.0-3.0 Discussed warfarin dosing with pharmacy, plan to hold for now - LDH 160 - DL site looks stable,  Continue cefadroxil  for suppression   4. DL site infection - Resolved - Continue cefadroxil  for suppression, appreciate ID   5 Atrial fibrillation: Paroxysmal - Had severe AF with RVR 7/22 s/p DC-CV x 2 - Seen by EP. Not a candidate for ablation due to body habitus  (could consider AVN ablation and CRT, if needed)  - Had post-op AF with RVR post VAD - 10/23 S/P DC-CV with restoration of NSR. - Continue amio 100mg  daily - Tele once on the unit - On warfarin   6. HTN - stable. Holding antihypertensives with current GIB   7. CKD stage 3B  - Has solitary kidney.   - Baseline creatinine is 1.5-1.8.  - Creatinine improved to 1.35   8. OSA - Continue CPAP   9. DM2  - Holding Jardiance  with low volume. 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 Depression/anxiety - Continue Zoloft  150mg  daily - Referral to psych made OP   12. Morbid obesity - Body mass index is 42.76 kg/m. - Continue Ozempic .    I reviewed the LVAD parameters from today, and compared the results to the patient's prior  recorded data.  No programming changes were made.  The LVAD is functioning within specified parameters.  The patient performs LVAD self-test daily.  LVAD interrogation was negative for any significant power changes, alarms or PI events/speed drops.  LVAD equipment check completed and is in good working order.  Back-up equipment present.   LVAD education done on emergency procedures and precautions and reviewed exit site care.  Length of Stay: 0  Beckey LITTIE Coe, NP 06/29/2024, 2:10 PM   VAD Team Pager [phone] (7am - 7am) +++VAD ISSUES ONLY+++  Advanced Heart Failure Team Pager [phone] (M-F; 7a - 5p)  Please contact [facility] for night-coverage after hours (5p -7a ) and weekends on [email or web address] for all non- LVAD Issues

## 2024-06-29 NOTE — Plan of Care (Signed)
  Problem: Education: Goal: Patient will understand all VAD equipment and how it functions Outcome: Progressing   Problem: Cardiac: Goal: LVAD will function as expected and patient will experience no clinical alarms Outcome: Progressing   Problem: Education: Goal: Patient will understand all VAD equipment and how it functions Outcome: Progressing   Problem: Cardiac: Goal: LVAD will function as expected and patient will experience no clinical alarms Outcome: Progressing   Problem: Education: Goal: Knowledge of General Education information will improve Description: Including pain rating scale, medication(s)/side effects and non-pharmacologic comfort measures Outcome: Progressing   Problem: Clinical Measurements: Goal: Will remain free from infection Outcome: Progressing   Problem: Activity: Goal: Risk for activity intolerance will decrease Outcome: Progressing   Problem: Nutrition: Goal: Adequate nutrition will be maintained Outcome: Progressing

## 2024-06-29 NOTE — TOC CM/SW Note (Signed)
 Transition of Care [facility]) - Inpatient Brief Assessment   Patient Details  Name: Jeffrey [name] MRN: [id number] Date of Birth: April 05, 1976  [facility] CM/SW Contact:    Lauraine FORBES Saa, LCSWA Phone Number: 06/29/2024, 4:38 PM   Clinical Narrative:  4:38 PM Per chart review, patient resides at home with spouse and child(ren). Patient has insurance and does not have a PCP but is followed by Cardiology, Electrophysiology-Cardiology, and Pulmonary Disease. Patient does not have SNF history but has CIR history with [facility]. Patient has HH history with [facility], [facility], and [facility]. Patient has DME (CPAP, BSC, rollator, shower stool, walker) history with [facility]. Patient's preferred pharmacy's are [facility] and [facility] [address]. TOC will continue to follow and be available to assist.  Transition of Care Asessment: Insurance and Status: Insurance coverage has been reviewed Patient has primary care physician: No Home environment has been reviewed: Private Residence Prior level of function:: N/A Prior/Current Home Services: No current home services Social Drivers of Health Review: SDOH reviewed no interventions necessary Readmission risk has been reviewed: Yes (Currently Yellow 17%) Transition of care needs: transition of care needs identified, TOC will continue to follow

## 2024-06-29 NOTE — Consult Note (Signed)
 Consultation  Referring Provider: Cardiology/Dr. Rolan Primary Care Physician:  Oley Bascom RAMAN, NP Primary Gastroenterologist:  Dr.Gessner  Reason for Consultation: Weakness, dizziness, severe anemia in setting of chronic Coumadin  use  HPI: Jeffrey [name] is a 48 y.o. male with history of nonischemic cardiomyopathy with EF less than 20%/status post HM 3 VAD October 2022.  Also with history of poorly controlled hypertension, morbid obesity for which he had previously been on Ozempic  and is now back on Ozempic , and history of renal cell carcinoma status post right nephrectomy.  Patient is maintained on chronic Coumadin . He was seen outpatient today by cardiology with complaints of weakness, lightheadedness and dizziness over the past couple of days and was found to have a hemoglobin of 4.6, and INR of 3.8 He says that perhaps his stools have been a little bit darker but he had not noticed any overt melena or hematochezia over the past several weeks.  He has no current complaints of abdominal pain, no nausea or vomiting, no heartburn or indigestion, no changes in bowel habits. No regular NSAID use. He did have previous colonoscopy and EGD in 2022 per Dr. Avram for iron deficiency anemia colonoscopy was noted to have internal and external hemorrhoids and scattered diverticulosis. At EGD 02/2021 patchy mildly erythematous mucosa without bleeding in the gastric antrum otherwise unremarkable study.   Last CBC 05/02/2024 with WBC 4.8/hemoglobin 14.9/hematocrit 44.4/MCV 85.1  Today-WBC 6.1/hemoglobin 4.6/hematocrit 16.1/MCV 105.2/platelets 199 Pro time 38.8/INR 3.8 INR had also been high earlier this month at 4.2 on 06/07/2024 Sodium 137/potassium 4.2/BUN 28/creatinine 1.80  He is to be transfused 3 units of packed RBCs this evening, stool Hemoccult pending   Past Medical History:  Diagnosis Date   Anxiety    Aspirin  allergy    Childhood asthma    Chronic systolic CHF (congestive heart  failure) (HCC)    a. EF 20-25% in 2012. b. EF 45-50% in 10/2011 with nonischemic nuc - presumed NICM. c. 12/2014 Echo: Sev depressed LV fxn, sev dil LV, mild LVH, mild MR, sev dil LA, mildly reduced RV fxn.   CKD (chronic kidney disease) stage 2, GFR 60-89 ml/min    Depression    H/O vasectomy 12/2019   High cholesterol    Hypertension    Morbid obesity (HCC)    Nephrolithiasis    OSA on CPAP    Paroxysmal atrial fibrillation (HCC)    Presumed NICM    a. 04/2014 Myoview : EF 26%, glob HK, sev glob HK, ? prior infarct;  b. Never cathed 2/2 CKD.   Renal cell carcinoma (HCC)    a. s/p Rt robotic assisted partial converted to radical nephrectomy on 01/2013.   Troponin level elevated    a. 04/2014, 12/2014: felt due to CHF.   Type II diabetes mellitus (HCC)    Ventricular tachycardia (HCC)    a. appropriate ICD therapy 12/2017    Past Surgical History:  Procedure Laterality Date   APPENDECTOMY  07/2004   APPLICATION OF WOUND VAC N/A 08/12/2022   Procedure: APPLICATION OF WOUND VAC;  Surgeon: Obadiah Coy, MD;  Location: [facility] OR;  Service: Thoracic;  Laterality: N/A;   APPLICATION OF WOUND VAC N/A 08/18/2022   Procedure: WOUND VAC CHANGE;  Surgeon: Obadiah Coy, MD;  Location: [facility] OR;  Service: Thoracic;  Laterality: N/A;   BIOPSY  02/10/2021   Procedure: BIOPSY;  Surgeon: Avram Lupita BRAVO, MD;  Location: [facility] ENDOSCOPY;  Service: Endoscopy;;   CARDIAC CATHETERIZATION N/A 05/17/2015   Procedure: Right/Left  Heart Cath and Coronary Angiography;  Surgeon: Toribio JONELLE Fuel, MD;  Location: [facility] INVASIVE CV LAB;  Service: Cardiovascular;  Laterality: N/A;   CARDIOVERSION N/A 04/30/2021   Procedure: CARDIOVERSION;  Surgeon: Rolan Ezra RAMAN, MD;  Location: [facility] ENDOSCOPY;  Service: Cardiovascular;  Laterality: N/A;   CARDIOVERSION N/A 07/27/2021   Procedure: CARDIOVERSION;  Surgeon: Fuel Toribio JONELLE, MD;  Location: [facility] OR;  Service: Cardiovascular;  Laterality: N/A;   COLONOSCOPY WITH PROPOFOL  N/A  02/10/2021   Procedure: COLONOSCOPY WITH PROPOFOL ;  Surgeon: Avram Lupita BRAVO, MD;  Location: [facility] ENDOSCOPY;  Service: Endoscopy;  Laterality: N/A;   EP IMPLANTABLE DEVICE N/A 05/17/2015   Procedure: SubQ ICD Implant;  Surgeon: Will Gladis Norton, MD;  Location: [facility] INVASIVE CV LAB;  Service: Cardiovascular;  Laterality: N/A;   ESOPHAGOGASTRODUODENOSCOPY (EGD) WITH PROPOFOL  N/A 02/10/2021   Procedure: ESOPHAGOGASTRODUODENOSCOPY (EGD) WITH PROPOFOL ;  Surgeon: Avram Lupita BRAVO, MD;  Location: [facility] ENDOSCOPY;  Service: Endoscopy;  Laterality: N/A;   INSERTION OF IMPLANTABLE LEFT VENTRICULAR ASSIST DEVICE N/A 07/21/2021   Procedure: INSERTION OF IMPLANTABLE LEFT VENTRICULAR ASSIST DEVICE;  Surgeon: Lucas Dorise POUR, MD;  Location: [facility] OR;  Service: Open Heart Surgery;  Laterality: N/A;   RIGHT HEART CATH N/A 05/13/2021   Procedure: RIGHT HEART CATH;  Surgeon: Fuel Toribio JONELLE, MD;  Location: [facility] INVASIVE CV LAB;  Service: Cardiovascular;  Laterality: N/A;   RIGHT HEART CATH N/A 07/18/2021   Procedure: RIGHT HEART CATH;  Surgeon: Fuel Toribio JONELLE, MD;  Location: [facility] INVASIVE CV LAB;  Service: Cardiovascular;  Laterality: N/A;   ROBOTIC ASSISTED LAPAROSCOPIC LYSIS OF ADHESION  01/13/2013   Procedure: ROBOTIC ASSISTED LAPAROSCOPIC LYSIS OF ADHESION EXTENSIVE;  Surgeon: Ricardo Likens, MD;  Location: [facility] ORS;  Service: Urology;;   ROBOTIC ASSITED PARTIAL NEPHRECTOMY Right 01/13/2013   Procedure: ROBOTIC ASSITED PARTIAL NEPHRECTOMY CONVERTED TO ROBOTIC ASSISTED RIGHT RADICAL NEPHRECTOMY;  Surgeon: Ricardo Likens, MD;  Location: [facility] ORS;  Service: Urology;  Laterality: Right;   STERNAL WOUND DEBRIDEMENT N/A 08/12/2022   Procedure: ABDOMINAL WOUND DEBRIDEMENT;  Surgeon: Obadiah Coy, MD;  Location: [facility] OR;  Service: Thoracic;  Laterality: N/A;   STERNAL WOUND DEBRIDEMENT N/A 08/18/2022   Procedure: ABDOMINAL WOUND DEBRIDEMENT;  Surgeon: Obadiah Coy, MD;  Location: [facility] OR;  Service: Thoracic;  Laterality: N/A;   SUBQ  ICD CHANGEOUT N/A 05/16/2020   Procedure: DLAV ICD CHANGEOUT;  Surgeon: Cindie Ole DASEN, MD;  Location: [facility] INVASIVE CV LAB;  Service: Cardiovascular;  Laterality: N/A;   SUBQ ICD CHANGEOUT N/A 05/15/2020   Procedure: SUBQ ICD CHANGEOUT;  Surgeon: Fernande Elspeth BROCKS, MD;  Location: [facility] INVASIVE CV LAB;  Service: Cardiovascular;  Laterality: N/A;   TEE WITHOUT CARDIOVERSION N/A 04/30/2021   Procedure: TRANSESOPHAGEAL ECHOCARDIOGRAM (TEE);  Surgeon: Rolan Ezra RAMAN, MD;  Location: [facility] ENDOSCOPY;  Service: Cardiovascular;  Laterality: N/A;   TEE WITHOUT CARDIOVERSION N/A 07/21/2021   Procedure: TRANSESOPHAGEAL ECHOCARDIOGRAM (TEE);  Surgeon: Lucas Dorise POUR, MD;  Location: [facility] OR;  Service: Open Heart Surgery;  Laterality: N/A;   VASECTOMY      Prior to Admission medications   Medication Sig Start Date End Date Taking? Authorizing Provider  acetaminophen  (TYLENOL ) 325 MG tablet Take 2 tablets (650 mg total) by mouth every 6 (six) hours as needed for fever. 05/22/21   Clegg, Ambur Province D, NP  allopurinol  (ZYLOPRIM ) 100 MG tablet Take 1 tablet (100 mg total) by mouth daily. 10/26/23   Bensimhon, Toribio JONELLE, MD  amiodarone  (PACERONE ) 200 MG tablet Take 1 tablet (200 mg total)  by mouth daily. 07/02/23   Bensimhon, Toribio SAUNDERS, MD  amLODipine  (NORVASC ) 10 MG tablet Take 1 tablet (10 mg total) by mouth daily 03/17/24 10/13/24  Nichols, Tonya S, NP  Blood Pressure KIT 1 Units by Does not apply route daily. 06/30/23   Oley Bascom RAMAN, NP  carvedilol  (COREG ) 3.125 MG tablet Take 1 tablet (3.125 mg total) by mouth 2 (two) times daily with a meal. 03/09/24   Zenaida Morene PARAS, MD  cefadroxil  (DURICEF) 500 MG capsule Take 1 capsule (500 mg total) by mouth 2 (two) times daily. 06/17/23   Bensimhon, Toribio SAUNDERS, MD  colchicine  0.6 MG tablet Take 1 tablet (0.6 mg total) by mouth once daily. 02/17/24   Bensimhon, Toribio SAUNDERS, MD  cyanocobalamin  (VITAMIN B12) 1000 MCG tablet Take 1 tablet (1,000 mcg total) by mouth daily. 06/30/23   Nichols, Tonya  S, NP  cyclobenzaprine  (FLEXERIL ) 5 MG tablet Take 1 tablet (5 mg total) by mouth 3 (three) times daily as needed for muscle spasms Patient not taking: Reported on 06/29/2024 09/18/23     doxazosin  (CARDURA ) 4 MG tablet Take 2 tablets (8 mg total) by mouth 2 (two) times daily. 09/01/23   Bensimhon, Toribio SAUNDERS, MD  empagliflozin  (JARDIANCE ) 10 MG TABS tablet Take 1 tablet (10 mg total) by mouth daily before breakfast. 11/15/23   Bensimhon, Toribio SAUNDERS, MD  ferrous sulfate  (FEROSUL) 325 (65 FE) MG tablet Take 1 tablet (325 mg total) by mouth daily before breakfast. 01/19/24   Nichols, Tonya S, NP  folic acid  (FOLVITE ) 800 MCG tablet Take 0.5 tablets (400 mcg total) by mouth daily. 06/30/23   Oley Bascom RAMAN, NP  gabapentin  (NEURONTIN ) 400 MG capsule Take 1 capsule (400 mg total) by mouth 3 (three) times daily. 05/24/24   Bensimhon, Toribio SAUNDERS, MD  glucose blood test strip Use as directed 4 times daily. 08/25/21   Shannan Sia I, NP  hydrocortisone  (ANUSOL -HC) 25 MG suppository Place 1 suppository (25 mg total) rectally at bedtime. Patient not taking: Reported on 06/29/2024 01/27/24   Kennedy-Smith, Colleen M, NP  isosorbide -hydrALAZINE  (BIDIL ) 20-37.5 MG tablet Take 2 tablets by mouth 3 (three) times daily. 01/19/24   Bensimhon, Toribio SAUNDERS, MD  meclizine  (ANTIVERT ) 25 MG tablet Take 1 tablet (25 mg total) by mouth 3 (three) times daily as needed for dizziness. 06/28/24   Bensimhon, Toribio SAUNDERS, MD  ondansetron  (ZOFRAN -ODT) 4 MG disintegrating tablet Take 1 tablet (4 mg total) by mouth every 8 (eight) hours as needed for nausea or vomiting. Patient not taking: Reported on 06/29/2024 10/21/23   Zenaida Morene PARAS, MD  pantoprazole  (PROTONIX ) 40 MG tablet Take 1 tablet (40 mg total) by mouth once daily. 08/26/23   Bensimhon, Toribio SAUNDERS, MD  rosuvastatin  (CRESTOR ) 10 MG tablet Take 1 tablet (10 mg total) by mouth daily. 02/17/24   Bensimhon, Toribio SAUNDERS, MD  sacubitril -valsartan  (ENTRESTO ) 97-103 MG Take 1 tablet by mouth 2  (two) times daily. Patient not taking: Reported on 06/29/2024 08/05/22   Bensimhon, Toribio SAUNDERS, MD  Semaglutide , 2 MG/DOSE, 8 MG/3ML SOPN Inject 2 mg as directed once a week. 02/29/24   Oley Bascom RAMAN, NP  sertraline  (ZOLOFT ) 100 MG tablet Take 1.5 tablets (150 mg total) by mouth daily. 04/19/24   Bensimhon, Toribio SAUNDERS, MD  spironolactone  (ALDACTONE ) 25 MG tablet Take 1 tablet (25 mg total) by mouth daily. 06/17/23 07/23/24  Bensimhon, Toribio SAUNDERS, MD  torsemide  (DEMADEX ) 20 MG tablet Take 1 tablet (20 mg total) by mouth as needed. Patient not  taking: Reported on 06/29/2024 11/12/23   Bensimhon, Toribio SAUNDERS, MD  traMADol  HCl 100 MG TABS Take 1 tablet (100 mg) by mouth every 6 (six) hours as needed for pain 09/14/22   Bensimhon, Toribio SAUNDERS, MD  traZODone  (DESYREL ) 100 MG tablet Take 1 tablet (100 mg total) by mouth at bedtime. 08/26/23   Bensimhon, Toribio SAUNDERS, MD  warfarin (COUMADIN ) 4 MG tablet Take 12 mg (3 tabs) every Tuesday and 8 mg (2 tabs) all other days or as directed by HF clinic 12/24/23   Bensimhon, Toribio SAUNDERS, MD    Current Facility-Administered Medications  Medication Dose Route Frequency Provider Last Rate Last Admin   0.9 %  sodium chloride  infusion (Manually program via Guardrails IV Fluids)   Intravenous Once Hayes Beckey CROME, NP       acetaminophen  (TYLENOL ) tablet 650 mg  650 mg Oral Q6H PRN Hayes Beckey CROME, NP       [START ON 06/30/2024] allopurinol  (ZYLOPRIM ) tablet 100 mg  100 mg Oral Daily Hayes Beckey CROME, NP       [START ON 06/30/2024] amiodarone  (PACERONE ) tablet 200 mg  200 mg Oral Daily Hayes Beckey CROME, NP       cefadroxil  (DURICEF) capsule 500 mg  500 mg Oral BID Hayes Beckey CROME, NP       [START ON 06/30/2024] colchicine  tablet 0.6 mg  0.6 mg Oral Daily Hayes Beckey CROME, NP       [START ON 06/30/2024] cyanocobalamin  (VITAMIN B12) tablet 1,000 mcg  1,000 mcg Oral Daily Hayes Beckey CROME, NP       [START ON 06/30/2024] folic acid  (FOLVITE ) tablet 400 mcg  400 mcg Oral Daily Hayes Beckey L, NP       gabapentin  (NEURONTIN )  capsule 400 mg  400 mg Oral TID Hayes Beckey CROME, NP       pantoprazole  (PROTONIX ) injection 40 mg  40 mg Intravenous BID Hayes Beckey CROME, NP       [START ON 06/30/2024] rosuvastatin  (CRESTOR ) tablet 10 mg  10 mg Oral Daily Hayes Beckey CROME, NP       [START ON 06/30/2024] sertraline  (ZOLOFT ) tablet 150 mg  150 mg Oral Daily Hayes Beckey L, NP       traZODone  (DESYREL ) tablet 100 mg  100 mg Oral QHS Hayes Beckey CROME, NP        Allergies as of 06/29/2024 - Review Complete 06/20/2024  Allergen Reaction Noted   Aspirin  Shortness Of Breath, Itching, and Rash 10/22/2011   Bee venom Hives and Swelling 06/18/2013   Lisinopril  Cough and Other (See Comments) 10/10/2015   Tomato Hives 04/03/2012    Family History  Problem Relation Age of Onset   Hypertension Mother    Cancer Maternal Grandfather        type unknown   Diabetes Maternal Grandfather    Heart disease Maternal Grandfather    Diabetes Maternal Aunt    Heart attack Neg Hx    Stroke Neg Hx    Colon cancer Neg Hx    Pancreatic cancer Neg Hx    Stomach cancer Neg Hx    Liver cancer Neg Hx    Esophageal cancer Neg Hx     Social History   Socioeconomic History   Marital status: Married    Spouse name: Freddie Dymek   Number of children: 14   Years of education: Not on file   Highest education level: Not on file  Occupational History   Not on file  Tobacco Use  Smoking status: Former    Current packs/day: 0.00    Average packs/day: 0.3 packs/day for 22.0 years (5.5 ttl pk-yrs)    Types: Cigarettes    Start date: 04/29/1993    Quit date: 04/30/2015    Years since quitting: 9.1   Smokeless tobacco: Never  Vaping Use   Vaping status: Never Used  Substance and Sexual Activity   Alcohol use: Yes    Comment: occasional- weekends   Drug use: Yes    Types: Marijuana   Sexual activity: Not Currently    Birth control/protection: Condom  Other Topics Concern   Not on file  Social History Narrative   Married - 14 kids 7 boys 7 girls many are  adults, 2 young children with current wife   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   Social Drivers of Health   Financial Resource Strain: Medium Risk (09/08/2023)   Overall Financial Resource Strain (CARDIA)    Difficulty of Paying Living Expenses: Somewhat hard  Food Insecurity: No Food Insecurity (04/12/2024)   Hunger Vital Sign    Worried About Running Out of Food in the Last Year: Never true    Ran Out of Food in the Last Year: Never true  Transportation Needs: No Transportation Needs (04/12/2024)   PRAPARE - Administrator, Civil Service (Medical): No    Lack of Transportation (Non-Medical): No  Physical Activity: Sufficiently Active (10/18/2023)   Exercise Vital Sign    Days of Exercise per Week: 7 days    Minutes of Exercise per Session: 50 min  Stress: Stress Concern Present (09/08/2023)   [employer] of Occupational Health - Occupational Stress Questionnaire    Feeling of Stress : To some extent  Social Connections: Socially Integrated (02/15/2023)   Social Connection and Isolation Panel    Frequency of Communication with Friends and Family: More than three times a week    Frequency of Social Gatherings with Friends and Family: More than three times a week    Attends Religious Services: 1 to 4 times per year    Active Member of [employer] or Organizations: Yes    Attends Banker Meetings: Never    Marital Status: Married  Catering manager Violence: Not At Risk (04/12/2024)   Humiliation, Afraid, Rape, and Kick questionnaire    Fear of Current or Ex-Partner: No    Emotionally Abused: No    Physically Abused: No    Sexually Abused: No    Review of  Systems: Pertinent positive and negative review of systems were noted in the above HPI section.  All other review of systems was otherwise negative.   Physical Exam: Vital signs in last 24 hours: Temp:  [98.4 F (36.9 C)] 98.4 F (36.9 C) (09/25 1513) Resp:  [16] 16 (09/25 1513) BP: (80)/(0) 80/0 (09/25 1408) SpO2:  [100 %] 100 % (09/25 1409)   General:   Alert,  Well-developed, obese African-American male pleasant and cooperative in NAD-eating dinner Head:  Normocephalic and atraumatic. Eyes:  Sclera clear, no icterus.   Conjunctiva pale Ears:  Normal auditory acuity. Nose:  No deformity, discharge,  or lesions. Mouth:  No deformity or lesions.  Neck:  Supple; no masses or thyromegaly. Lungs:  Clear throughout to auscultation.   No wheezes, crackles, or rhonchi.  Heart: LVAD hum Abdomen; soft, obese, nontender, no palpable mass or hepatosplenomegaly Rectal: Not done Msk:  Symmetrical without gross deformities. . Pulses:  Normal pulses noted. Extremities:  Without clubbing or edema. Neurologic:  Alert and  oriented x4;  grossly normal neurologically. Skin:  Intact without significant lesions or rashes.. Psych:  Alert and cooperative. Normal mood and affect.  Intake/Output from previous day: No intake/output data recorded. Intake/Output this shift: No intake/output data recorded.  Lab Results: Recent Labs    06/29/24 1251  WBC 6.1  HGB 4.6*  HCT 16.1*  PLT 199   BMET Recent Labs    06/29/24 1251  NA 137  K 4.2  CL 108  CO2 21*  GLUCOSE 145*  BUN 28*  CREATININE 1.80*  CALCIUM  8.9   LFT No results for input(s): PROT, ALBUMIN , AST, ALT, ALKPHOS, BILITOT, BILIDIR, IBILI in the last 72 hours. PT/INR Recent Labs    06/29/24 1251  LABPROT 38.8*  INR 3.8*   Hepatitis Panel No results for input(s): HEPBSAG, HCVAB, HEPAIGM, HEPBIGM in the last 72 hours.    IMPRESSION:  #2 48 year old African-American male with severe nonischemic  cardiomyopathy status post HM 3 LVAD 2022, most recent EF less than 20% Patient admitted today after being seen outpatient by cardiology with complaints of dizziness and lightheadedness over the past couple of days and found to have hemoglobin of 4.6, macrocytic and with supratherapeutic INR of 3.8.  Patient reports recent stools may be a bit darker but no overt melena or hematochezia  Suspect he has had subacute GI blood loss over the past 2 months most likely secondary to AVMs which are known to be quite common in LVAD patients. Previous endoscopic evaluation 2022 for iron deficiency anemia with EGD and colonoscopy with findings only of internal/external hemorrhoids, scattered diverticulosis and mild gastritis.   #2 morbid obesity-currently on Ozempic  #3 poorly controlled hypertension history #4 diabetes mellitus #5 sleep apnea #6 history of renal cell CA status post right nephrectomy #7 history of iron deficiency anemia with previous endoscopic evaluation 2022 with EGD and colonoscopy revealing internal and external hemorrhoids, scattered diverticulosis and gastritis but unrevealing as to source of iron deficiency anemia. #8 chronic anticoagulation-on Coumadin    PLAN: Transfuse 3 units of packed RBCs, then continue to trend hemoglobin and transfuse as indicated to keep his hemoglobin 7 Anemia panel and if significantly iron deficient will benefit from IV iron during this admission. Hold Coumadin  We will plan for EGD/enteroscopy once his INR drifts closer to 2. Endoscopic evaluation was discussed with the patient at bedside, including indications risk benefits and he is agreeable to proceed. Will follow with you   Ellary Casamento EsterwoodPA-C  06/29/2024, 3:32 PM

## 2024-06-29 NOTE — Patient Instructions (Signed)
 Admit due to critical Hgb of 4.6

## 2024-06-29 NOTE — Progress Notes (Signed)
 Patient presents to [facility] today for sick visit alone. Reports no problem with VAD or equipment.   Patient c/o dizziness and lightheadedness that started a few days. He denies any falls or syncope. He admits he has not been drinking as much as usual over the last two days. Increased number of PI events noted over last few days.  Pt denies bloody stools; does report darker stools over last few days but attributes to eating greens.   Orthostatics obtained as below.  Critical Hgb called by lab of 4.6. Per Dr. Gardenia we will admit patient to 2C under Dr. Rolan service.    Vital Signs:   Lying   Sitting:  Standing:   Doppler Pressure:  84 Automatic BP:  89/75 (82)  84/64 (71)  74/60 (66) HR:    71    68   84 SPO2: 96% on RA   Weight: 294.6 lb bed weight Last weight: 285.4 lbs w/eqt   VAD Indication: Destination Therapy due to smoking and BMI   VAD interrogation & Equipment Management: Speed:  6300 Flow:  4.1 Power: 5.1 PI:  4.0 Hct: 48   Alarms: few low voltage advisories Events: 0 - 40   Fixed speed 6300 Low speed limit: 6000   Primary Controller: Replace back up battery in 21 mths Back up controller: Replace back up battery in 32 mths   Annual Equipment Maintenance on UBC/PM was performed on 06/17/23.    I reviewed the LVAD parameters from today and compared the results to the patient's prior recorded data. LVAD interrogation was NEGATIVE for significant power changes, NEGATIVE for clinical alarms and STABLE for PI events/speed drops. No programming changes were made and pump is functioning within specified parameters. Pt is performing daily controller and system monitor self tests along with completing weekly and monthly maintenance for LVAD equipment.   LVAD equipment check completed and is in good working order. Back-up equipment not present. Asked cousin to bring bag for patient admission.   Exit Site Care: Drive line being maintained weekly by pt's wife.  Existing dressing CDI. Pt reports dressing was changed on 06/27/24.  Device:  Left subcutaneous dual ICD Therapies: on at 250 BPM Last check: 05/31/24  BP & Labs:  Doppler  - Doppler is reflecting modified systolic   Hgb 4.6  - No S/S of bleeding. Specifically denies melena/BRBPR or nosebleeds.   LDH 150 with established baseline of 160 - 300. Denies tea-colored urine. No power elevations noted on interrogation.   Plan:  Admit  Geofm Christmas RN VAD Coordinator  Office: [phone]  24/7 Pager: [phone]

## 2024-06-29 NOTE — Progress Notes (Signed)
 PHARMACY consult - VAD patient   Allergies  Allergen Reactions   Aspirin  Shortness Of Breath, Itching and Rash     Burning sensation (Patient reports he tolerates other NSAIDS)    Bee Venom Hives and Swelling   Lisinopril  Cough and Other (See Comments)   Tomato Hives    Patient Measurements: Weight: 125.5 kg (276 lb 10.8 oz)  Vital Signs: Temp: 98.4 F (36.9 C) (09/25 1513) Temp Source: Oral (09/25 1513) BP: 101/83 (09/25 1513)  Labs: Recent Labs    06/29/24 1251  HGB 4.6*  HCT 16.1*  PLT 199  LABPROT 38.8*  INR 3.8*  CREATININE 1.80*    Estimated Creatinine Clearance: 63.1 mL/min (A) (by C-G formula based on SCr of 1.8 mg/dL (H)).   Medical History: Past Medical History:  Diagnosis Date   Anxiety    Aspirin  allergy    Childhood asthma    Chronic systolic CHF (congestive heart failure) (HCC)    a. EF 20-25% in 2012. b. EF 45-50% in 10/2011 with nonischemic nuc - presumed NICM. c. 12/2014 Echo: Sev depressed LV fxn, sev dil LV, mild LVH, mild MR, sev dil LA, mildly reduced RV fxn.   CKD (chronic kidney disease) stage 2, GFR 60-89 ml/min    Depression    H/O vasectomy 12/2019   High cholesterol    Hypertension    Morbid obesity (HCC)    Nephrolithiasis    OSA on CPAP    Paroxysmal atrial fibrillation (HCC)    Presumed NICM    a. 04/2014 Myoview : EF 26%, glob HK, sev glob HK, ? prior infarct;  b. Never cathed 2/2 CKD.   Renal cell carcinoma (HCC)    a. s/p Rt robotic assisted partial converted to radical nephrectomy on 01/2013.   Troponin level elevated    a. 04/2014, 12/2014: felt due to CHF.   Type II diabetes mellitus (HCC)    Ventricular tachycardia (HCC)    a. appropriate ICD therapy 12/2017    Medications:  Medications Prior to Admission  Medication Sig Dispense Refill Last Dose/Taking   acetaminophen  (TYLENOL ) 325 MG tablet Take 2 tablets (650 mg total) by mouth every 6 (six) hours as needed for fever.      allopurinol  (ZYLOPRIM ) 100 MG tablet Take 1  tablet (100 mg total) by mouth daily. 90 tablet 3    amiodarone  (PACERONE ) 200 MG tablet Take 1 tablet (200 mg total) by mouth daily. 30 tablet 11    amLODipine  (NORVASC ) 10 MG tablet Take 1 tablet (10 mg total) by mouth daily 30 tablet 6    Blood Pressure KIT 1 Units by Does not apply route daily. 1 kit 0    carvedilol  (COREG ) 3.125 MG tablet Take 1 tablet (3.125 mg total) by mouth 2 (two) times daily with a meal. 90 tablet 3    cefadroxil  (DURICEF) 500 MG capsule Take 1 capsule (500 mg total) by mouth 2 (two) times daily. 180 capsule 3    colchicine  0.6 MG tablet Take 1 tablet (0.6 mg total) by mouth once daily. 30 tablet 6    cyanocobalamin  (VITAMIN B12) 1000 MCG tablet Take 1 tablet (1,000 mcg total) by mouth daily. 30 tablet 0    cyclobenzaprine  (FLEXERIL ) 5 MG tablet Take 1 tablet (5 mg total) by mouth 3 (three) times daily as needed for muscle spasms (Patient not taking: Reported on 06/29/2024) 15 tablet 0    doxazosin  (CARDURA ) 4 MG tablet Take 2 tablets (8 mg total) by mouth 2 (two) times  daily. 120 tablet 6    empagliflozin  (JARDIANCE ) 10 MG TABS tablet Take 1 tablet (10 mg total) by mouth daily before breakfast. 30 tablet 11    ferrous sulfate  (FEROSUL) 325 (65 FE) MG tablet Take 1 tablet (325 mg total) by mouth daily before breakfast. 90 tablet 1    folic acid  (FOLVITE ) 800 MCG tablet Take 0.5 tablets (400 mcg total) by mouth daily. 30 tablet 2    gabapentin  (NEURONTIN ) 400 MG capsule Take 1 capsule (400 mg total) by mouth 3 (three) times daily. 30 capsule 6    glucose blood test strip Use as directed 4 times daily. 100 each 11    hydrocortisone  (ANUSOL -HC) 25 MG suppository Place 1 suppository (25 mg total) rectally at bedtime. (Patient not taking: Reported on 06/29/2024) 5 suppository 1    isosorbide -hydrALAZINE  (BIDIL ) 20-37.5 MG tablet Take 2 tablets by mouth 3 (three) times daily. 180 tablet 6    meclizine  (ANTIVERT ) 25 MG tablet Take 1 tablet (25 mg total) by mouth 3 (three) times  daily as needed for dizziness. 90 tablet 2    ondansetron  (ZOFRAN -ODT) 4 MG disintegrating tablet Take 1 tablet (4 mg total) by mouth every 8 (eight) hours as needed for nausea or vomiting. (Patient not taking: Reported on 06/29/2024) 5 tablet 0    pantoprazole  (PROTONIX ) 40 MG tablet Take 1 tablet (40 mg total) by mouth once daily. 90 tablet 3    rosuvastatin  (CRESTOR ) 10 MG tablet Take 1 tablet (10 mg total) by mouth daily. 90 tablet 3    sacubitril -valsartan  (ENTRESTO ) 97-103 MG Take 1 tablet by mouth 2 (two) times daily. (Patient not taking: Reported on 06/29/2024) 60 tablet 6    Semaglutide , 2 MG/DOSE, 8 MG/3ML SOPN Inject 2 mg as directed once a week. 3 mL 5    sertraline  (ZOLOFT ) 100 MG tablet Take 1.5 tablets (150 mg total) by mouth daily. 135 tablet 3    spironolactone  (ALDACTONE ) 25 MG tablet Take 1 tablet (25 mg total) by mouth daily. 60 tablet 6    torsemide  (DEMADEX ) 20 MG tablet Take 1 tablet (20 mg total) by mouth as needed. (Patient not taking: Reported on 06/29/2024) 30 tablet 0    traMADol  HCl 100 MG TABS Take 1 tablet (100 mg) by mouth every 6 (six) hours as needed for pain 90 tablet 0    traZODone  (DESYREL ) 100 MG tablet Take 1 tablet (100 mg total) by mouth at bedtime. 30 tablet 11    warfarin (COUMADIN ) 4 MG tablet Take 12 mg (3 tabs) every Tuesday and 8 mg (2 tabs) all other days or as directed by HF clinic 90 tablet 11     Assessment: 48 y.o. 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 Hgb down to 4.6 and notes dark stools past 2-3 days. Pt on warfarin PTA. Admission INR 3.8.  Home dose: warfarin 12 mg (4 mg x 3) every Tue; 8 mg (4 mg x 2) all other days   Pt receiving PRBC x 3. GI consulted. Holding warfarin.  Goal of Therapy:  INR 2-2.5 Monitor platelets by anticoagulation protocol: Yes   Plan:  Hold warfarin F/u GI consult  Vito Ralph, PharmD, BCPS Please see amion for complete clinical pharmacist phone  list 06/29/2024,3:50 PM

## 2024-06-29 NOTE — Progress Notes (Addendum)
 Pt awake and alert. He transferred from stretcher to wheel chair without assistance. His cousin has gone to get patient something to eat and will bring it upstairs to his room.  Transported patient via w/c to room 2C12. Report given to bedside nurse. VAD cart placed at bedside with patient's back up bag on same. No further questions at this time.

## 2024-06-30 DIAGNOSIS — Z95811 Presence of heart assist device: Secondary | ICD-10-CM | POA: Diagnosis not present

## 2024-06-30 DIAGNOSIS — I5022 Chronic systolic (congestive) heart failure: Secondary | ICD-10-CM | POA: Diagnosis not present

## 2024-06-30 LAB — CBC
HCT: 22.1 % — ABNORMAL LOW (ref 39.0–52.0)
HCT: 24.9 % — ABNORMAL LOW (ref 39.0–52.0)
Hemoglobin: 7 g/dL — ABNORMAL LOW (ref 13.0–17.0)
Hemoglobin: 7.7 g/dL — ABNORMAL LOW (ref 13.0–17.0)
MCH: 30.4 pg (ref 26.0–34.0)
MCH: 29.4 pg (ref 26.0–34.0)
MCHC: 31.7 g/dL (ref 30.0–36.0)
MCHC: 30.9 g/dL (ref 30.0–36.0)
MCV: 96.1 fL (ref 80.0–100.0)
MCV: 95 fL (ref 80.0–100.0)
Platelets: 183 K/uL (ref 150–400)
Platelets: 198 K/uL (ref 150–400)
RBC: 2.3 MIL/uL — ABNORMAL LOW (ref 4.22–5.81)
RBC: 2.62 MIL/uL — ABNORMAL LOW (ref 4.22–5.81)
RDW: 20.6 % — ABNORMAL HIGH (ref 11.5–15.5)
RDW: 20.5 % — ABNORMAL HIGH (ref 11.5–15.5)
WBC: 7.2 K/uL (ref 4.0–10.5)
WBC: 7.1 K/uL (ref 4.0–10.5)
nRBC: 2.1 % — ABNORMAL HIGH (ref 0.0–0.2)
nRBC: 1.7 % — ABNORMAL HIGH (ref 0.0–0.2)

## 2024-06-30 LAB — BASIC METABOLIC PANEL WITH GFR
Anion gap: 6 (ref 5–15)
BUN: 31 mg/dL — ABNORMAL HIGH (ref 6–20)
CO2: 23 mmol/L (ref 22–32)
Calcium: 8.6 mg/dL — ABNORMAL LOW (ref 8.9–10.3)
Chloride: 109 mmol/L (ref 98–111)
Creatinine, Ser: 1.84 mg/dL — ABNORMAL HIGH (ref 0.61–1.24)
GFR, Estimated: 44 mL/min — ABNORMAL LOW (ref >60)
Glucose, Bld: 98 mg/dL (ref 70–99)
Potassium: 4.2 mmol/L (ref 3.5–5.1)
Sodium: 138 mmol/L (ref 135–145)

## 2024-06-30 LAB — LACTATE DEHYDROGENASE: LDH: 147 U/L (ref 98–192)

## 2024-06-30 LAB — PREPARE RBC (CROSSMATCH)

## 2024-06-30 LAB — PROTIME-INR
INR: 3.6 — ABNORMAL HIGH (ref 0.8–1.2)
Prothrombin Time: 37.2 s — ABNORMAL HIGH (ref 11.4–15.2)

## 2024-06-30 MED ORDER — HYDROCODONE-ACETAMINOPHEN 7.5-325 MG PO TABS
1.0000 | ORAL_TABLET | Freq: Four times a day (QID) | ORAL | Status: DC | PRN
  Administered 2024-06-30 – 2024-07-04 (×5): 1 via ORAL
  Filled 2024-06-30 (×5): qty 1

## 2024-06-30 MED ORDER — BOOST / RESOURCE BREEZE PO LIQD CUSTOM
1.0000 | Freq: Three times a day (TID) | ORAL | Status: AC
  Administered 2024-06-30 – 2024-07-01 (×3): 1 via ORAL

## 2024-06-30 MED ORDER — SODIUM CHLORIDE 0.9% IV SOLUTION: Freq: Once | INTRAVENOUS | Status: AC

## 2024-06-30 NOTE — Progress Notes (Signed)
 LVAD Coordinator Rounding Note:   Admitted 06/29/24 from VAD Clinic for suspected GIB with a critical Hgb 4.6. Pt seen in VAD Clinic yesterday for sick visit with complaints of dizziness and lightheadedness for approximately 3 days. Pt states he noticed his stool was darker but denies any signs of acute bleeding.    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 Lucas under Destination Therapy criteria due to BMI and chronic kidney disease.   GI consulted. Recommendations for Enteroscopy once INR <1.8. INR today 3.6. Hgb 7.0 this morning after 3 PRBC. An additional unit given this morning. AHF team recommending pt drink 3 Boost's today to assist in lowering INR. Pt seen this afternoon and given first Boost.    Vital signs: Temp: 98.1  HR: 61 NSR Doppler: 100 Automatic cuff: 109/97 (101) O2 Sat: 99% on RA Wt: 276.9 lbs  LVAD interrogation reveals:  Speed: 6200 Flow: 4.2 Power:  5.1 w PI: 3.9 Hct: 20   Alarms:  none Events: rare   Fixed speed:  6200 Low speed limit: 5900   Drive Line: Existing drive line dressing CDI. Anchor secure. Next dressing change due 07/04/24 by bedside nurse.  Labs:  LDH trend: 147  INR trend: 3.6  Hgb: 7.0  Anticoagulation Plan: -INR Goal: 2.0 - 2.5  -ASA Dose: none due to allergy   Blood Products:  9/25: 3 PRBC 9/26: 1 PRBC  Device: - [employer] single ICD -Therapies: on per BS rep on 08/06/21   Infection:   Drips:    Plan/Recommendations:  Call VAD Coordinator if any VAD equipment or drive line issues.  Schuyler Lunger RN,BSN VAD Coordinator  Office: [phone]  24/7 Pager: [phone]

## 2024-06-30 NOTE — Progress Notes (Signed)
   06/30/24 0000  BiPAP/CPAP/SIPAP  $ Non-Invasive Ventilator  Non-Invasive Vent Initial;Non-Invasive Vent Subsequent;Non-Invasive Vent Set Up  $ Face Mask Large  Yes  BiPAP/CPAP/SIPAP Pt Type Adult  BiPAP/CPAP/SIPAP DREAMSTATIOND  Mask Type Full face mask  Dentures removed? Not applicable  Mask Size Large  Respiratory Rate 20 breaths/min  FiO2 (%) 21 %  Flow Rate 0 lpm  Patient Home Machine No  Patient Home Mask No  Patient Home Tubing No  Auto Titrate Yes  Minimum cmH2O 6 cmH2O  Maximum cmH2O 16 cmH2O  CPAP/SIPAP surface wiped down Yes  Device Plugged into RED Power Outlet Yes  BiPAP/CPAP /SiPAP Vitals  Temp 98.5 F (36.9 C)  Pulse Rate 69  Resp 20  BP 98/78  SpO2 97 %  Bilateral Breath Sounds Clear;Diminished  MEWS Score/Color  MEWS Score 1  MEWS Score Color Green

## 2024-06-30 NOTE — Progress Notes (Addendum)
 Advanced Heart Failure VAD Team Note  PCP-Cardiologist: Toribio Fuel, MD  Chief Complaint: GIB Subjective:   Hgb 4.6 on admission. S/p 3uPRBCs 9/25. Hgb up to 7  GI following. Plan for scope when INR 1.5-1.8  Has not had BM yet. Hemoccult pending.   LVAD INTERROGATION:  HeartMate III LVAD:   Flow 4.2 liters/min, speed 6300, power 5, PI 3.9. 9 PI events this morning    Objective:    Vital Signs:   Temp:  [97.7 F (36.5 C)-99.4 F (37.4 C)] 97.7 F (36.5 C) (09/26 0743) Pulse Rate:  [57-75] 57 (09/26 0743) Resp:  [16-23] 23 (09/26 0500) BP: (92-104)/(71-83) 99/81 (09/26 0743) SpO2:  [93 %-100 %] 99 % (09/26 0500) FiO2 (%):  [21 %] 21 % (09/26 0000) Weight:  [125.5 kg-125.6 kg] 125.6 kg (09/26 0500) Last BM Date : 06/29/24 Mean arterial Pressure 80s-90s  Intake/Output:   Intake/Output Summary (Last 24 hours) at 06/30/2024 0955 Last data filed at 06/30/2024 0658 Gross per 24 hour  Intake 1905 ml  Output 1200 ml  Net 705 ml    Physical Exam    General:  Well appearing. No resp difficulty Neck:  JVP ~7.  Cor: Mechanical heart sounds with LVAD hum present. Lungs: Clear  Driveline: C/D/I; securement device intact and driveline incorporated Extremities: no edema Neuro: alert & oriented x3. Affect pleasant   Telemetry   SB-NSR 50s-60s (Personally reviewed)    EKG    NSR with sinus arrhythmia   Labs   Basic Metabolic Panel: Recent Labs  Lab 06/29/24 1251 06/30/24 0529  NA 137 138  K 4.2 4.2  CL 108 109  CO2 21* 23  GLUCOSE 145* 98  BUN 28* 31*  CREATININE 1.80* 1.84*  CALCIUM  8.9 8.6*    Liver Function Tests: No results for input(s): AST, ALT, ALKPHOS, BILITOT, PROT, ALBUMIN  in the last 168 hours. No results for input(s): LIPASE, AMYLASE in the last 168 hours. No results for input(s): AMMONIA in the last 168 hours.  CBC: Recent Labs  Lab 06/29/24 1251 06/30/24 0529  WBC 6.1 7.2  HGB 4.6* 7.0*  HCT 16.1* 22.1*  MCV  105.2* 96.1  PLT 199 183    INR: Recent Labs  Lab 06/29/24 1251 06/30/24 0529  INR 3.8* 3.6*    Other results: EKG:    Imaging   No results found.   Medications:     Scheduled Medications:  allopurinol   100 mg Oral Daily   amiodarone   200 mg Oral Daily   cefadroxil   500 mg Oral BID   colchicine   0.6 mg Oral Daily   cyanocobalamin   1,000 mcg Oral Daily   folic acid   500 mcg Oral Daily   gabapentin   400 mg Oral TID   pantoprazole  (PROTONIX ) IV  40 mg Intravenous BID   rosuvastatin   10 mg Oral Daily   sertraline   150 mg Oral Daily   traZODone   100 mg Oral QHS    Infusions:   PRN Medications: acetaminophen    Patient Profile   Jeffrey [name] is a 48 y.o. yo male with a history of  poorly controlled HTN, R renal cell carcinoma s/p nephrectomy, CKD IIIa, DM2, OSA, gout, morbid obesity and systolic HF due to NICM. 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 Down to 4.6 today - Denies BRB or abnormal bleeding, only dark stools  - INR 3.8 on admission. 3.6 today. Will discuss FFP with team. Plan to increase Ensure  intake today.  - GI following. Plan for scope once INR 1.5-1.8 - Check hemoccult - s/p 3uPRBCs 9/25. Hgb 4.6>7. Will give another unit this morning.  - Follow H&H - Continue protonix  40 IV BID - Holding warfarin - Iron stores low. Plan for IV iron OP with ongoing blood tx's.    2. Chronic systolic CHF - Nonischemic CM, end-stage - EF 2012; 20-25%  - Echo  8/22: EF < 20%, severe LV dilation, moderate RV dysfunction.   - RHC (8/22): well compensated hemodynamics. - Echo 04/2024: Mild RV dysfunction, LVEF <20%, severe LV dilation - s/p S-ICD - S/p HM3 LVAD 07/21/21 (DT) - NYHA I-II - Holding bidil , coreg , spironolactone  and amlodipine  with dizziness, GIB and borderline orthostasis. VAD MAPs 80s-90s.  - Aware of need to get to BMI ~35 to permit transplant  BMI >40. Now back on Ozempic . Significant weight loss and steadily  improving - Last Ramp echo 7/25: speed 6300   3. VAD  - s/p HM-3 placement 07/21/21  - VAD interrogated as above - INR 3.8 Goal INR 2.0-3.0 Discussed warfarin dosing with pharmacy, plan to hold for now - LDH 160>147 - DL site looks stable,  Continue cefadroxil  for suppression   4. DL site infection - Resolved - Continue cefadroxil  for suppression, appreciate ID   5 Atrial fibrillation: Paroxysmal - Had severe AF with RVR 7/22 s/p DC-CV x 2 - Seen by EP. Not a candidate for ablation due to body habitus  (could consider AVN ablation and CRT, if needed)  - Had post-op AF with RVR post VAD - 10/23 S/P DC-CV with restoration of NSR. - Continue amio 200mg  daily - SB-NSR - Warfarin on hold   6. HTN - stable. Holding antihypertensives with current GIB   7. CKD stage 3B  - Has solitary kidney.   - Baseline creatinine is 1.5-1.8.  - SCr 1.84 today - Off diuretics. Avoid hypotension   8. OSA - Continue CPAP   9. DM2  - Holding Jardiance  with low volume. 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 Depression/anxiety - Continue Zoloft  150mg  daily - Referral to psych made OP   12. Morbid obesity - Body mass index is 43.37 kg/m. - Continue Ozempic .   I reviewed the LVAD parameters from today, and compared the results to the patient's prior recorded data.  No programming changes were made.  The LVAD is functioning within specified parameters.  The patient performs LVAD self-test daily.  LVAD interrogation was negative for any significant power changes, alarms or PI events/speed drops.  LVAD equipment check completed and is in good working order.  Back-up equipment present.   LVAD education done on emergency procedures and precautions and reviewed exit site care.  Length of Stay: 1  Beckey LITTIE Coe, NP 06/30/2024, 9:55 AM  VAD Team --- VAD ISSUES ONLY--- Pager [phone] (7am - 7am)  Advanced Heart Failure Team  Pager  [phone] (M-F; 7a - 5p)  Please contact [facility] for night-coverage after hours (5p -7a ) and weekends on [email or web address]  Patient seen with NP, I formulated the plan and agree with the above note.   Patient had 3 units PRBCs yesterday, 1 today with hgb 7.  Stool remains dark.  INR 3.6 today.   MAP 80s, BP-active meds on hold.   General: Well appearing this am. NAD.  HEENT: Normal. Neck: Supple, JVP 7-8 cm. Carotids OK.  Cardiac:  Mechanical heart sounds with LVAD hum present.  Lungs:  CTAB, normal effort.  Abdomen:  NT, ND, no HSM. No bruits or masses. +BS  LVAD exit site: Well-healed and incorporated. Dressing dry and intact. No erythema or drainage. Stabilization device present and accurately applied. Driveline dressing changed daily per sterile technique. Extremities:  Warm and dry. No cyanosis, clubbing, rash, or edema.  Neuro:  Alert & oriented x 3. Cranial nerves grossly intact. Moves all 4 extremities w/o difficulty. Affect pleasant    MAP stable, INR 3.6.  He is drinking Ensures today and warfarin is on hold.  Need INR 1.8 for small bowel enteroscopy to look for AVMs.  Continue IV Protonix .  Will repeat CBC this evening post-transfusion.   LVAD parameters reviewed and stable.   Ezra Shuck 06/30/2024 3:41 PM

## 2024-06-30 NOTE — Progress Notes (Addendum)
 Pt was complaining of 10/10 pain in right upper quadrant abdominal area, notified Donnel, MD. See new orders.  Lonell LITTIE Lyme, RN

## 2024-06-30 NOTE — Progress Notes (Signed)
 PHARMACY consult - VAD patient   Allergies  Allergen Reactions   Aspirin  Shortness Of Breath, Itching and Rash     Burning sensation (Patient reports he tolerates other NSAIDS)    Bee Venom Hives and Swelling   Lisinopril  Cough and Other (See Comments)   Tomato Hives    Only Fresh tomato     Patient Measurements: Height: 5' 7 (170.2 cm) Weight: 125.6 kg (276 lb 14.4 oz) IBW/kg (Calculated) : 66.1 HEPARIN  DW (KG): 95.5  Vital Signs: Temp: 97.7 F (36.5 C) (09/26 0743) Temp Source: Oral (09/26 0743) BP: 99/81 (09/26 0743) Pulse Rate: 57 (09/26 0743)  Labs: Recent Labs    06/29/24 1251 06/30/24 0529  HGB 4.6* 7.0*  HCT 16.1* 22.1*  PLT 199 183  LABPROT 38.8* 37.2*  INR 3.8* 3.6*  CREATININE 1.80* 1.84*    Estimated Creatinine Clearance: 61.8 mL/min (A) (by C-G formula based on SCr of 1.84 mg/dL (H)).   Medical History: Past Medical History:  Diagnosis Date   Anxiety    Aspirin  allergy    Childhood asthma    Chronic systolic CHF (congestive heart failure) (HCC)    a. EF 20-25% in 2012. b. EF 45-50% in 10/2011 with nonischemic nuc - presumed NICM. c. 12/2014 Echo: Sev depressed LV fxn, sev dil LV, mild LVH, mild MR, sev dil LA, mildly reduced RV fxn.   CKD (chronic kidney disease) stage 2, GFR 60-89 ml/min    Depression    H/O vasectomy 12/2019   High cholesterol    Hypertension    Morbid obesity (HCC)    Nephrolithiasis    OSA on CPAP    Paroxysmal atrial fibrillation (HCC)    Presumed NICM    a. 04/2014 Myoview : EF 26%, glob HK, sev glob HK, ? prior infarct;  b. Never cathed 2/2 CKD.   Renal cell carcinoma (HCC)    a. s/p Rt robotic assisted partial converted to radical nephrectomy on 01/2013.   Troponin level elevated    a. 04/2014, 12/2014: felt due to CHF.   Type II diabetes mellitus (HCC)    Ventricular tachycardia (HCC)    a. appropriate ICD therapy 12/2017    Medications:  Medications Prior to Admission  Medication Sig Dispense Refill Last  Dose/Taking   acetaminophen  (TYLENOL ) 325 MG tablet Take 2 tablets (650 mg total) by mouth every 6 (six) hours as needed for fever.   Past Week   allopurinol  (ZYLOPRIM ) 100 MG tablet Take 1 tablet (100 mg total) by mouth daily. 90 tablet 3 06/29/2024 Morning   amiodarone  (PACERONE ) 200 MG tablet Take 1 tablet (200 mg total) by mouth daily. 30 tablet 11 06/29/2024 Morning   amLODipine  (NORVASC ) 10 MG tablet Take 1 tablet (10 mg total) by mouth daily 30 tablet 6 06/29/2024 Morning   carvedilol  (COREG ) 3.125 MG tablet Take 1 tablet (3.125 mg total) by mouth 2 (two) times daily with a meal. 90 tablet 3 06/29/2024 Morning   cefadroxil  (DURICEF) 500 MG capsule Take 1 capsule (500 mg total) by mouth 2 (two) times daily. 180 capsule 3 06/29/2024 Morning   colchicine  0.6 MG tablet Take 1 tablet (0.6 mg total) by mouth once daily. 30 tablet 6 06/29/2024 Morning   cyanocobalamin  (VITAMIN B12) 1000 MCG tablet Take 1 tablet (1,000 mcg total) by mouth daily. 30 tablet 0 06/29/2024 Morning   cyclobenzaprine  (FLEXERIL ) 5 MG tablet Take 1 tablet (5 mg total) by mouth 3 (three) times daily as needed for muscle spasms 15 tablet 0 Past  Month   doxazosin  (CARDURA ) 4 MG tablet Take 2 tablets (8 mg total) by mouth 2 (two) times daily. 120 tablet 6 06/29/2024 Morning   empagliflozin  (JARDIANCE ) 10 MG TABS tablet Take 1 tablet (10 mg total) by mouth daily before breakfast. 30 tablet 11 06/29/2024 Morning   ferrous sulfate  (FEROSUL) 325 (65 FE) MG tablet Take 1 tablet (325 mg total) by mouth daily before breakfast. 90 tablet 1 06/29/2024 Morning   folic acid  (FOLVITE ) 800 MCG tablet Take 0.5 tablets (400 mcg total) by mouth daily. 30 tablet 2 06/29/2024 Morning   gabapentin  (NEURONTIN ) 400 MG capsule Take 1 capsule (400 mg total) by mouth 3 (three) times daily. 30 capsule 6 06/29/2024 Morning   isosorbide -hydrALAZINE  (BIDIL ) 20-37.5 MG tablet Take 2 tablets by mouth 3 (three) times daily. 180 tablet 6 06/29/2024 Morning   meclizine   (ANTIVERT ) 25 MG tablet Take 1 tablet (25 mg total) by mouth 3 (three) times daily as needed for dizziness. 90 tablet 2 06/29/2024 Morning   ondansetron  (ZOFRAN -ODT) 4 MG disintegrating tablet Take 1 tablet (4 mg total) by mouth every 8 (eight) hours as needed for nausea or vomiting. 5 tablet 0 Past Month   pantoprazole  (PROTONIX ) 40 MG tablet Take 1 tablet (40 mg total) by mouth once daily. 90 tablet 3 06/29/2024 Morning   rosuvastatin  (CRESTOR ) 10 MG tablet Take 1 tablet (10 mg total) by mouth daily. 90 tablet 3 06/29/2024 Morning   Semaglutide , 2 MG/DOSE, 8 MG/3ML SOPN Inject 2 mg as directed once a week. 3 mL 5 05/27/2024   sertraline  (ZOLOFT ) 100 MG tablet Take 1.5 tablets (150 mg total) by mouth daily. 135 tablet 3 06/29/2024 Morning   spironolactone  (ALDACTONE ) 25 MG tablet Take 1 tablet (25 mg total) by mouth daily. 60 tablet 6 06/29/2024 Morning   traMADol  HCl 100 MG TABS Take 1 tablet (100 mg) by mouth every 6 (six) hours as needed for pain 90 tablet 0 Past Week   traZODone  (DESYREL ) 100 MG tablet Take 1 tablet (100 mg total) by mouth at bedtime. 30 tablet 11 06/28/2024 Bedtime   warfarin (COUMADIN ) 4 MG tablet Take 12 mg (3 tabs) every Tuesday and 8 mg (2 tabs) all other days or as directed by HF clinic (Patient taking differently: Take 8-12 mg by mouth See admin instructions. Take 12 mg (3 tabs) by mouth every Tuesday and then take 8 mg (2 tabs) by mouth all other days or as directed by HF clinic) 90 tablet 11 06/28/2024 at  4:00 PM   Blood Pressure KIT 1 Units by Does not apply route daily. 1 kit 0    glucose blood test strip Use as directed 4 times daily. 100 each 11    sacubitril -valsartan  (ENTRESTO ) 97-103 MG Take 1 tablet by mouth 2 (two) times daily. (Patient not taking: Reported on 06/29/2024) 60 tablet 6 Not Taking    Assessment: 48 y.o. 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 Hgb down to 4.6 and notes dark stools past 2-3 days. Pt on  warfarin PTA. Admission INR 3.8.  Home dose: warfarin 12 mg (4 mg x 3) every Tue; 8 mg (4 mg x 2) all other days   Pt receiving PRBC x 3 > HGB 7 > 1 uts PRBC  INR 3.6> will give ensure to decrease for procedure   Goal of Therapy:  INR 2-2.5 Monitor platelets by anticoagulation protocol: Yes   Plan:  Hold warfarin Daily CBC, INR F/u GI consult  Olam Chalk Pharm.D. CPP, BCPS Clinical Pharmacist [phone] 06/30/2024 11:10 AM   Please see amion for complete clinical pharmacist phone list 06/30/2024,11:09 AM

## 2024-07-01 DIAGNOSIS — Z95811 Presence of heart assist device: Secondary | ICD-10-CM | POA: Diagnosis not present

## 2024-07-01 DIAGNOSIS — I5022 Chronic systolic (congestive) heart failure: Secondary | ICD-10-CM | POA: Diagnosis not present

## 2024-07-01 LAB — BASIC METABOLIC PANEL WITH GFR
Anion gap: 10 (ref 5–15)
BUN: 31 mg/dL — ABNORMAL HIGH (ref 6–20)
CO2: 22 mmol/L (ref 22–32)
Calcium: 8.8 mg/dL — ABNORMAL LOW (ref 8.9–10.3)
Chloride: 105 mmol/L (ref 98–111)
Creatinine, Ser: 1.82 mg/dL — ABNORMAL HIGH (ref 0.61–1.24)
GFR, Estimated: 45 mL/min — ABNORMAL LOW (ref >60)
Glucose, Bld: 106 mg/dL — ABNORMAL HIGH (ref 70–99)
Potassium: 4.1 mmol/L (ref 3.5–5.1)
Sodium: 137 mmol/L (ref 135–145)

## 2024-07-01 LAB — HEMOGLOBIN AND HEMATOCRIT, BLOOD
HCT: 25.6 % — ABNORMAL LOW (ref 39.0–52.0)
Hemoglobin: 8.1 g/dL — ABNORMAL LOW (ref 13.0–17.0)

## 2024-07-01 LAB — OCCULT BLOOD X 1 CARD TO LAB, STOOL: Fecal Occult Bld: POSITIVE — AB

## 2024-07-01 LAB — CBC
HCT: 23.5 % — ABNORMAL LOW (ref 39.0–52.0)
Hemoglobin: 7.3 g/dL — ABNORMAL LOW (ref 13.0–17.0)
MCH: 29.6 pg (ref 26.0–34.0)
MCHC: 31.1 g/dL (ref 30.0–36.0)
MCV: 95.1 fL (ref 80.0–100.0)
Platelets: 184 K/uL (ref 150–400)
RBC: 2.47 MIL/uL — ABNORMAL LOW (ref 4.22–5.81)
RDW: 20.4 % — ABNORMAL HIGH (ref 11.5–15.5)
WBC: 6.7 K/uL (ref 4.0–10.5)
nRBC: 1.3 % — ABNORMAL HIGH (ref 0.0–0.2)

## 2024-07-01 LAB — PROTIME-INR
INR: 2.9 — ABNORMAL HIGH (ref 0.8–1.2)
Prothrombin Time: 32 s — ABNORMAL HIGH (ref 11.4–15.2)

## 2024-07-01 LAB — LACTATE DEHYDROGENASE: LDH: 159 U/L (ref 98–192)

## 2024-07-01 LAB — PREPARE RBC (CROSSMATCH)

## 2024-07-01 MED ORDER — ISOSORB DINITRATE-HYDRALAZINE 20-37.5 MG PO TABS
1.0000 | ORAL_TABLET | Freq: Three times a day (TID) | ORAL | Status: DC
  Administered 2024-07-01 – 2024-07-02 (×3): 1 via ORAL
  Filled 2024-07-01 (×3): qty 1

## 2024-07-01 MED ORDER — SODIUM CHLORIDE 0.9% IV SOLUTION: Freq: Once | INTRAVENOUS | Status: AC

## 2024-07-01 MED ORDER — ISOSORB DINITRATE-HYDRALAZINE 20-37.5 MG PO TABS: 1.0000 | ORAL_TABLET | Freq: Three times a day (TID) | ORAL | Status: DC

## 2024-07-01 NOTE — Progress Notes (Signed)
 Patient ID: Jeffrey [name], male   DOB: 1976-08-08, 48 y.o.   MRN: [id number]   Advanced Heart Failure VAD Team Note  PCP-Cardiologist: Toribio Fuel, MD  Chief Complaint: GIB Subjective:    Hgb 4.6 on admission. S/p 3u PRBCs 9/25, 1 u PRBCs 9/26.  Hgb 7.3 today.  Stool remains dark. Heme+.   GI following. Plan for scope when INR 1.5-1.8.  INR 3.6 => 2.9.   MAP 90s-100s.   LVAD INTERROGATION:  HeartMate III LVAD:   Flow 5 liters/min, speed 6300, power 5, PI 3.9. Multiple PI events  Objective:    Vital Signs:   Temp:  [97.7 F (36.5 C)-98.6 F (37 C)] 97.9 F (36.6 C) (09/27 1219) Pulse Rate:  [56-74] 59 (09/27 1219) Resp:  [15-19] 19 (09/27 1219) BP: (106-129)/(89-99) 119/99 (09/27 1219) SpO2:  [92 %-100 %] 97 % (09/27 1219) FiO2 (%):  [21 %] 21 % (09/26 2243) Weight:  [126.3 kg] 126.3 kg (09/27 0400) Last BM Date : 06/30/24 Mean arterial Pressure 90s-100s  Intake/Output:   Intake/Output Summary (Last 24 hours) at 07/01/2024 1432 Last data filed at 07/01/2024 1223 Gross per 24 hour  Intake 596 ml  Output 2115 ml  Net -1519 ml    Physical Exam    General: Well appearing this am. NAD.  HEENT: Normal. Neck: Supple, JVP 7-8 cm. Carotids OK.  Cardiac:  Mechanical heart sounds with LVAD hum present.  Lungs:  CTAB, normal effort.  Abdomen:  NT, ND, no HSM. No bruits or masses. +BS  LVAD exit site: Well-healed and incorporated. Dressing dry and intact. No erythema or drainage. Stabilization device present and accurately applied. Driveline dressing changed daily per sterile technique. Extremities:  Warm and dry. No cyanosis, clubbing, rash, or edema.  Neuro:  Alert & oriented x 3. Cranial nerves grossly intact. Moves all 4 extremities w/o difficulty. Affect pleasant    Telemetry   NSR 60s (Personally reviewed)    Labs   Basic Metabolic Panel: Recent Labs  Lab 06/29/24 1251 06/30/24 0529 07/01/24 0203  NA 137 138 137  K 4.2 4.2 4.1  CL 108 109 105  CO2 21* 23  22  GLUCOSE 145* 98 106*  BUN 28* 31* 31*  CREATININE 1.80* 1.84* 1.82*  CALCIUM  8.9 8.6* 8.8*    Liver Function Tests: No results for input(s): AST, ALT, ALKPHOS, BILITOT, PROT, ALBUMIN  in the last 168 hours. No results for input(s): LIPASE, AMYLASE in the last 168 hours. No results for input(s): AMMONIA in the last 168 hours.  CBC: Recent Labs  Lab 06/29/24 1251 06/30/24 0529 06/30/24 1800 07/01/24 0203  WBC 6.1 7.2 7.1 6.7  HGB 4.6* 7.0* 7.7* 7.3*  HCT 16.1* 22.1* 24.9* 23.5*  MCV 105.2* 96.1 95.0 95.1  PLT 199 183 198 184    INR: Recent Labs  Lab 06/29/24 1251 06/30/24 0529 07/01/24 0203  INR 3.8* 3.6* 2.9*    Other results: EKG:    Imaging   No results found.   Medications:     Scheduled Medications:  sodium chloride    Intravenous Once   allopurinol   100 mg Oral Daily   amiodarone   200 mg Oral Daily   cefadroxil   500 mg Oral BID   colchicine   0.6 mg Oral Daily   cyanocobalamin   1,000 mcg Oral Daily   feeding supplement  1 Container Oral TID BM   folic acid   500 mcg Oral Daily   gabapentin   400 mg Oral TID   isosorbide -hydrALAZINE   1 tablet  Oral TID   pantoprazole  (PROTONIX ) IV  40 mg Intravenous BID   rosuvastatin   10 mg Oral Daily   sertraline   150 mg Oral Daily   traZODone   100 mg Oral QHS    Infusions:   PRN Medications: acetaminophen , HYDROcodone -acetaminophen    Patient Profile   Jeffrey [name] is a 48 y.o. yo male with a history of  poorly controlled HTN, R renal cell carcinoma s/p nephrectomy, CKD IIIa, DM2, OSA, gout, morbid obesity and systolic HF due to NICM. 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 Down to 4.6 at admission, 7.3 today.  Has had 4 units PRBCs.  Will transfuse 1 unit PRBCs today.  - Stool dark, heme+.  - INR 3.8 on admission. 2.9 today. Continue to drink Ensure.  - GI following. Plan for scope once INR 1.5-1.8 - Continue protonix  40 IV BID - Holding  warfarin - Iron stores low. Plan for IV iron OP with ongoing blood tx's.    2. Chronic systolic CHF - Nonischemic CM, end-stage - EF 2012; 20-25%  - Echo  8/22: EF < 20%, severe LV dilation, moderate RV dysfunction.   - RHC (8/22): well compensated hemodynamics. - Echo 04/2024: Mild RV dysfunction, LVEF <20%, severe LV dilation - s/p S-ICD - S/p HM3 LVAD 07/21/21 (DT) - NYHA I-II - MAP elevated, will restart Bidil  1 tab tid and gradually restart other meds to manage BP.  - Aware of need to get to BMI ~35 to permit transplant  BMI >40. Now back on Ozempic . Significant weight loss and steadily improving - Last Ramp echo 7/25: speed 6300   3. VAD  - s/p HM-3 placement 07/21/21  - VAD interrogated as above - Warfarin on hold with GI bleed and need for small bowel enteroscopy.  - LDH 839>852>840 - DL site looks stable,  Continue cefadroxil  for suppression   4. DL site infection - Resolved - Continue cefadroxil  for suppression, appreciate ID   5 Atrial fibrillation: Paroxysmal - Had severe AF with RVR 7/22 s/p DC-CV x 2 - Seen by EP. Not a candidate for ablation due to body habitus  (could consider AVN ablation and CRT, if needed)  - Had post-op AF with RVR post VAD - 10/23 S/P DC-CV with restoration of NSR. - Continue amio 200 mg daily - SB-NSR - Warfarin on hold   6. HTN - MAP elevated, restart meds gradually.    7. CKD stage 3B  - Has solitary kidney.   - Baseline creatinine is 1.5-1.8.  - SCr 1.82 today - Off diuretics. Avoid hypotension   8. OSA - Continue CPAP   9. DM2  - Holding Jardiance  with low volume. 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 Depression/anxiety - Continue Zoloft  150mg  daily - Referral to psych made OP   12. Morbid obesity - Body mass index is 43.61 kg/m. - Continue Ozempic .   I reviewed the LVAD parameters from today, and compared the results to the patient's prior  recorded data.  No programming changes were made.  The LVAD is functioning within specified parameters.  The patient performs LVAD self-test daily.  LVAD interrogation was negative for any significant power changes, alarms or PI events/speed drops.  LVAD equipment check completed and is in good working order.  Back-up equipment present.   LVAD education done on emergency procedures and precautions and reviewed exit  site care.  Length of Stay: 2  Ezra Shuck, MD 07/01/2024, 2:32 PM  VAD Team --- VAD ISSUES ONLY--- Pager [phone] (7am - 7am)  Advanced Heart Failure Team  Pager [phone] (M-F; 7a - 5p)  Please contact [facility] for night-coverage after hours (5p -7a ) and weekends on [email or web address]

## 2024-07-01 NOTE — Progress Notes (Signed)
 GI continues to follow daily by lab check. We are waiting on INR to get below 2 ideally 1.5-1.8 at which point we will proceed with small bowel enteroscopy to evaluate occult GI bleeding leading to profound anemia. Likely Monday, assuming INR trends down Call if questions JMP

## 2024-07-02 DIAGNOSIS — D62 Acute posthemorrhagic anemia: Secondary | ICD-10-CM

## 2024-07-02 DIAGNOSIS — R195 Other fecal abnormalities: Secondary | ICD-10-CM | POA: Diagnosis not present

## 2024-07-02 DIAGNOSIS — I5022 Chronic systolic (congestive) heart failure: Secondary | ICD-10-CM | POA: Diagnosis not present

## 2024-07-02 DIAGNOSIS — Z7985 Long-term (current) use of injectable non-insulin antidiabetic drugs: Secondary | ICD-10-CM | POA: Diagnosis not present

## 2024-07-02 DIAGNOSIS — R42 Dizziness and giddiness: Secondary | ICD-10-CM | POA: Diagnosis not present

## 2024-07-02 DIAGNOSIS — Z95811 Presence of heart assist device: Secondary | ICD-10-CM | POA: Diagnosis not present

## 2024-07-02 LAB — PROTIME-INR
INR: 1.8 — ABNORMAL HIGH (ref 0.8–1.2)
Prothrombin Time: 22 s — ABNORMAL HIGH (ref 11.4–15.2)

## 2024-07-02 LAB — LACTATE DEHYDROGENASE: LDH: 151 U/L (ref 98–192)

## 2024-07-02 LAB — BASIC METABOLIC PANEL WITH GFR
Anion gap: 4 — ABNORMAL LOW (ref 5–15)
BUN: 31 mg/dL — ABNORMAL HIGH (ref 6–20)
CO2: 24 mmol/L (ref 22–32)
Calcium: 8.7 mg/dL — ABNORMAL LOW (ref 8.9–10.3)
Chloride: 109 mmol/L (ref 98–111)
Creatinine, Ser: 1.71 mg/dL — ABNORMAL HIGH (ref 0.61–1.24)
GFR, Estimated: 48 mL/min — ABNORMAL LOW (ref >60)
Glucose, Bld: 114 mg/dL — ABNORMAL HIGH (ref 70–99)
Potassium: 4.1 mmol/L (ref 3.5–5.1)
Sodium: 137 mmol/L (ref 135–145)

## 2024-07-02 LAB — CBC
HCT: 24.6 % — ABNORMAL LOW (ref 39.0–52.0)
Hemoglobin: 7.6 g/dL — ABNORMAL LOW (ref 13.0–17.0)
MCH: 29 pg (ref 26.0–34.0)
MCHC: 30.9 g/dL (ref 30.0–36.0)
MCV: 93.9 fL (ref 80.0–100.0)
Platelets: 197 K/uL (ref 150–400)
RBC: 2.62 MIL/uL — ABNORMAL LOW (ref 4.22–5.81)
RDW: 18.6 % — ABNORMAL HIGH (ref 11.5–15.5)
WBC: 5.4 K/uL (ref 4.0–10.5)
nRBC: 0.9 % — ABNORMAL HIGH (ref 0.0–0.2)

## 2024-07-02 LAB — HEMOGLOBIN AND HEMATOCRIT, BLOOD
HCT: 27 % — ABNORMAL LOW (ref 39.0–52.0)
Hemoglobin: 8.5 g/dL — ABNORMAL LOW (ref 13.0–17.0)

## 2024-07-02 LAB — PREPARE RBC (CROSSMATCH)

## 2024-07-02 MED ORDER — ISOSORB DINITRATE-HYDRALAZINE 20-37.5 MG PO TABS
2.0000 | ORAL_TABLET | Freq: Three times a day (TID) | ORAL | Status: DC
  Administered 2024-07-02 – 2024-07-08 (×18): 2 via ORAL
  Filled 2024-07-02 (×18): qty 2

## 2024-07-02 MED ORDER — SODIUM CHLORIDE 0.9% IV SOLUTION: Freq: Once | INTRAVENOUS | Status: AC

## 2024-07-02 NOTE — Progress Notes (Signed)
 Patient ID: Jeffrey [name], male   DOB: [birth year], 48 y.o.   MRN: [id number]   Advanced Heart Failure VAD Team Note  PCP-Cardiologist: Toribio Fuel, MD  Chief Complaint: GIB Subjective:    Hgb 4.6 on admission. S/p 3u PRBCs 9/25, 1 u PRBCs 9/26, 1 u PRBCs 9/27.  Hgb 7.6 today.  Stool remains black. Heme+.   GI following. INR 1.8 today, plan for small bowel enteroscopy tomorrow.    MAP 90s-100s.   LVAD INTERROGATION:  HeartMate III LVAD:   Flow 5.1 liters/min, speed 6300, power 5, PI 3.8.   Objective:    Vital Signs:   Temp:  [97.9 F (36.6 C)-98.5 F (36.9 C)] 98.4 F (36.9 C) (09/28 1147) Pulse Rate:  [59-68] 68 (09/28 0738) Resp:  [16-19] 16 (09/28 1147) BP: (93-122)/(60-98) 110/95 (09/28 1147) SpO2:  [92 %-100 %] 100 % (09/28 1147) FiO2 (%):  [21 %] 21 % (09/27 2332) Weight:  [126.9 kg] 126.9 kg (09/28 0500) Last BM Date : 07/02/24 Mean arterial Pressure 90s-100s  Intake/Output:   Intake/Output Summary (Last 24 hours) at 07/02/2024 1243 Last data filed at 07/02/2024 1100 Gross per 24 hour  Intake 1535.01 ml  Output 2550 ml  Net -1014.99 ml    Physical Exam    General: Well appearing this am. NAD.  HEENT: Normal. Neck: Supple, JVP 7-8 cm. Carotids OK.  Cardiac:  Mechanical heart sounds with LVAD hum present.  Lungs:  CTAB, normal effort.  Abdomen:  NT, ND, no HSM. No bruits or masses. +BS  LVAD exit site: Well-healed and incorporated. Dressing dry and intact. No erythema or drainage. Stabilization device present and accurately applied. Driveline dressing changed daily per sterile technique. Extremities:  Warm and dry. No cyanosis, clubbing, rash, or edema.  Neuro:  Alert & oriented x 3. Cranial nerves grossly intact. Moves all 4 extremities w/o difficulty. Affect pleasant    Telemetry   NSR 60s (Personally reviewed)    Labs   Basic Metabolic Panel: Recent Labs  Lab 06/29/24 1251 06/30/24 0529 07/01/24 0203 07/02/24 0404  NA 137 138 137 137  K 4.2  4.2 4.1 4.1  CL 108 109 105 109  CO2 21* 23 22 24   GLUCOSE 145* 98 106* 114*  BUN 28* 31* 31* 31*  CREATININE 1.80* 1.84* 1.82* 1.71*  CALCIUM  8.9 8.6* 8.8* 8.7*    Liver Function Tests: No results for input(s): AST, ALT, ALKPHOS, BILITOT, PROT, ALBUMIN  in the last 168 hours. No results for input(s): LIPASE, AMYLASE in the last 168 hours. No results for input(s): AMMONIA in the last 168 hours.  CBC: Recent Labs  Lab 06/29/24 1251 06/30/24 0529 06/30/24 1800 07/01/24 0203 07/01/24 1928 07/02/24 0404  WBC 6.1 7.2 7.1 6.7  --  5.4  HGB 4.6* 7.0* 7.7* 7.3* 8.1* 7.6*  HCT 16.1* 22.1* 24.9* 23.5* 25.6* 24.6*  MCV 105.2* 96.1 95.0 95.1  --  93.9  PLT 199 183 198 184  --  197    INR: Recent Labs  Lab 06/29/24 1251 06/30/24 0529 07/01/24 0203 07/02/24 0404  INR 3.8* 3.6* 2.9* 1.8*    Other results: EKG:    Imaging   No results found.   Medications:     Scheduled Medications:  sodium chloride    Intravenous Once   allopurinol   100 mg Oral Daily   amiodarone   200 mg Oral Daily   cefadroxil   500 mg Oral BID   colchicine   0.6 mg Oral Daily   cyanocobalamin   1,000 mcg Oral  Daily   folic acid   500 mcg Oral Daily   gabapentin   400 mg Oral TID   isosorbide -hydrALAZINE   2 tablet Oral TID   pantoprazole  (PROTONIX ) IV  40 mg Intravenous BID   rosuvastatin   10 mg Oral Daily   sertraline   150 mg Oral Daily   traZODone   100 mg Oral QHS    Infusions:   PRN Medications: acetaminophen , HYDROcodone -acetaminophen    Patient Profile   Jeffrey [name] is a 48 y.o. yo male with a history of  poorly controlled HTN, R renal cell carcinoma s/p nephrectomy, CKD IIIa, DM2, OSA, gout, morbid obesity and systolic HF due to NICM. 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 Down to 4.6 at admission, 7.6 today.  Has had 5 units PRBCs.  Will transfuse 1 unit PRBCs today.  - Stool black, heme+.  - INR 3.8 on admission. 1.8 today. -  Small bowel enteroscopy tomorrow.  - Continue protonix  40 IV BID - Holding warfarin - Iron stores low. Plan for IV iron OP with ongoing blood tx's.    2. Chronic systolic CHF - Nonischemic CM, end-stage - EF 2012; 20-25%  - Echo  8/22: EF < 20%, severe LV dilation, moderate RV dysfunction.   - RHC (8/22): well compensated hemodynamics. - Echo 04/2024: Mild RV dysfunction, LVEF <20%, severe LV dilation - s/p S-ICD - S/p HM3 LVAD 07/21/21 (DT) - NYHA I-II - MAP elevated, will increase Bidil  back to 2 tab tid and gradually restart other meds to manage BP.  - Aware of need to get to BMI ~35 to permit transplant  BMI >40. Now back on Ozempic . Significant weight loss and steadily improving - Last Ramp echo 7/25: speed 6300   3. VAD  - s/p HM-3 placement 07/21/21  - VAD interrogated as above - Warfarin on hold with GI bleed and need for small bowel enteroscopy.  - LDH 160>147>159>151 - DL site looks stable,  Continue cefadroxil  for suppression   4. DL site infection - Resolved - Continue cefadroxil  for suppression, appreciate ID   5 Atrial fibrillation: Paroxysmal - Had severe AF with RVR 7/22 s/p DC-CV x 2 - Seen by EP. Not a candidate for ablation due to body habitus  (could consider AVN ablation and CRT, if needed)  - Had post-op AF with RVR post VAD - 10/23 S/P DC-CV with restoration of NSR. - Continue amio 200 mg daily - SB-NSR - Warfarin on hold   6. HTN - MAP elevated, restart meds gradually.    7. CKD stage 3B  - Has solitary kidney.   - Baseline creatinine is 1.5-1.8.  - SCr 1.7 today - Off diuretics. Avoid hypotension   8. OSA - Continue CPAP   9. DM2  - Holding Jardiance  with low volume. 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 Depression/anxiety - Continue Zoloft  150mg  daily - Referral to psych made OP   12. Morbid obesity - Body mass index is 43.82 kg/m. - Continue Ozempic .   I  reviewed the LVAD parameters from today, and compared the results to the patient's prior recorded data.  No programming changes were made.  The LVAD is functioning within specified parameters.  The patient performs LVAD self-test daily.  LVAD interrogation was negative for any significant power changes, alarms or PI events/speed drops.  LVAD equipment check completed and is in good working  order.  Back-up equipment present.   LVAD education done on emergency procedures and precautions and reviewed exit site care.  Length of Stay: 3  Ezra Shuck, MD 07/02/2024, 12:43 PM  VAD Team --- VAD ISSUES ONLY--- Pager [phone] (7am - 7am)  Advanced Heart Failure Team  Pager [phone] (M-F; 7a - 5p)  Please contact [facility] for night-coverage after hours (5p -7a ) and weekends on [email or web address]

## 2024-07-02 NOTE — Progress Notes (Signed)
 Patient ID: Jeffrey [name], male   DOB: [birth year], 48 y.o.   MRN: [id number]    Progress Note   Subjective   Day # 3 CC; dizziness and lightheadedness, and LVAD patient on Coumadin -hemoglobin 4.6 on admission with supratherapeutic INR at 3.8  Coumadin  on hold  INR has drifted to 1.8 today WBC 5.4/hemoglobin 7.6/hematocrit 24.6 status post 3 units Potassium 4.1/BUN 31/creatinine 1.71  Patient in good spirits, no complaints of abdominal pain, says he had a dark blackish stool this morning Patient asked that I speak to his mom by phone who is a nurse, we discussed the current situation, suspicion for AVMs, and planned workup     Objective   Vital signs in last 24 hours: Temp:  [97.9 F (36.6 C)-98.5 F (36.9 C)] 98.1 F (36.7 C) (09/28 0738) Pulse Rate:  [59-68] 68 (09/28 0738) Resp:  [16-19] 19 (09/28 0738) BP: (93-122)/(60-99) 93/80 (09/28 0738) SpO2:  [92 %-99 %] 97 % (09/28 0738) FiO2 (%):  [21 %] 21 % (09/27 2332) Weight:  [126.9 kg] 126.9 kg (09/28 0500) Last BM Date : 07/01/24 General: Obese AA male  in NAD Heart: LVAD hum Lungs: Respirations even and unlabored, lungs CTA bilaterally Abdomen:  Soft, obese nontender, no palpable mass or hepatosplenomegaly. Normal bowel sounds.  Driveline in left mid abdomen Extremities:  Without edema. Neurologic:  Alert and oriented,  grossly normal neurologically. Psych:  Cooperative. Normal mood and affect.  Intake/Output from previous day: 09/27 0701 - 09/28 0700 In: 1531 [P.O.:712; I.V.:31.2; Blood:787.8] Out: 2475 [Urine:2475] Intake/Output this shift: Total I/O In: -  Out: 500 [Urine:500]  Lab Results: Recent Labs    06/30/24 1800 07/01/24 0203 07/01/24 1928 07/02/24 0404  WBC 7.1 6.7  --  5.4  HGB 7.7* 7.3* 8.1* 7.6*  HCT 24.9* 23.5* 25.6* 24.6*  PLT 198 184  --  197   BMET Recent Labs    06/30/24 0529 07/01/24 0203 07/02/24 0404  NA 138 137 137  K 4.2 4.1 4.1  CL 109 105 109  CO2 23 22 24   GLUCOSE 98  106* 114*  BUN 31* 31* 31*  CREATININE 1.84* 1.82* 1.71*  CALCIUM  8.6* 8.8* 8.7*   LFT No results for input(s): PROT, ALBUMIN , AST, ALT, ALKPHOS, BILITOT, BILIDIR, IBILI in the last 72 hours. PT/INR Recent Labs    07/01/24 0203 07/02/24 0404  LABPROT 32.0* 22.0*  INR 2.9* 1.8*        Assessment / Plan:    #27 48 year old African-American male with severe nonischemic cardiomyopathy status post HM 3 LVAD 2022-most recent echo EF less than 20%.  Patient was admitted on 06/29/2024 after being seen outpatient by cardiology with complaints of dizziness and lightheadedness over the past few days found to have a hemoglobin 4.6/macrocytic with supratherapeutic INR 3.8  Patient had been unaware of any melena or hematochezia but had noted stools a little bit darker recently.  Coumadin  has been on hold, INR now down to 1.8  Received 3 units of packed RBCs hemoglobin today 7.6.  He continues to have 1 dark bowel movement per day  Suspect that he has been having subacute GI blood loss over the past couple of months, very likely secondary to AVMs. Patient had previous endoscopic evaluation in 2022 for iron deficiency anemia with EGD and colonoscopy.  At that time colonoscopy with diverticulosis and internal/external hemorrhoids and EGD only with mild gastritis.  And therefore suspect he may have small bowel AVMs.  #2 anemia acute on chronic secondary  to GI blood loss-iron studies are consistent with iron deficiency He will benefit from IV iron infusion this admission, may need repeat infusions outpatient  #3 morbid obesity-on Ozempic -due for dose tomorrow #4 diabetes mellitus #5.  Sleep apnea #6.  History of renal cell CA status post right nephrectomy   Plan; okay for regular diet at lunch, then clear liquids for dinner and n.p.o. past midnight  Continue to trend hemoglobin and transfuse to keep hemoglobin close to 7.5 or above Consider IV iron infusion to  discharge  Please hold Ozempic  until after we complete GI workup  Patient will be scheduled for enteroscopy/small bowel endoscopy with Dr. San for tomorrow 07/03/2024 in AM.  Procedure was discussed in detail with the patient today including indications risks and benefits and he is agreeable to proceed.  Depending on findings at enteroscopy, we will decide if capsule endoscopy indicated.  GI will continue to follow with you     Principal Problem:   GIB (gastrointestinal bleeding)     LOS: 3 days   Rilda Bulls EsterwoodPA-C  07/02/2024, 9:06 AM

## 2024-07-02 NOTE — H&P (View-Only) (Signed)
 Patient ID: Jeffrey [name], male   DOB: August 08, 1976, 48 y.o.   MRN: [id number]    Progress Note   Subjective   Day # 3 CC; dizziness and lightheadedness, and LVAD patient on Coumadin -hemoglobin 4.6 on admission with supratherapeutic INR at 3.8  Coumadin  on hold  INR has drifted to 1.8 today WBC 5.4/hemoglobin 7.6/hematocrit 24.6 status post 3 units Potassium 4.1/BUN 31/creatinine 1.71  Patient in good spirits, no complaints of abdominal pain, says he had a dark blackish stool this morning Patient asked that I speak to his mom by phone who is a nurse, we discussed the current situation, suspicion for AVMs, and planned workup     Objective   Vital signs in last 24 hours: Temp:  [97.9 F (36.6 C)-98.5 F (36.9 C)] 98.1 F (36.7 C) (09/28 0738) Pulse Rate:  [59-68] 68 (09/28 0738) Resp:  [16-19] 19 (09/28 0738) BP: (93-122)/(60-99) 93/80 (09/28 0738) SpO2:  [92 %-99 %] 97 % (09/28 0738) FiO2 (%):  [21 %] 21 % (09/27 2332) Weight:  [126.9 kg] 126.9 kg (09/28 0500) Last BM Date : 07/01/24 General: Obese AA male  in NAD Heart: LVAD hum Lungs: Respirations even and unlabored, lungs CTA bilaterally Abdomen:  Soft, obese nontender, no palpable mass or hepatosplenomegaly. Normal bowel sounds.  Driveline in left mid abdomen Extremities:  Without edema. Neurologic:  Alert and oriented,  grossly normal neurologically. Psych:  Cooperative. Normal mood and affect.  Intake/Output from previous day: 09/27 0701 - 09/28 0700 In: 1531 [P.O.:712; I.V.:31.2; Blood:787.8] Out: 2475 [Urine:2475] Intake/Output this shift: Total I/O In: -  Out: 500 [Urine:500]  Lab Results: Recent Labs    06/30/24 1800 07/01/24 0203 07/01/24 1928 07/02/24 0404  WBC 7.1 6.7  --  5.4  HGB 7.7* 7.3* 8.1* 7.6*  HCT 24.9* 23.5* 25.6* 24.6*  PLT 198 184  --  197   BMET Recent Labs    06/30/24 0529 07/01/24 0203 07/02/24 0404  NA 138 137 137  K 4.2 4.1 4.1  CL 109 105 109  CO2 23 22 24   GLUCOSE 98  106* 114*  BUN 31* 31* 31*  CREATININE 1.84* 1.82* 1.71*  CALCIUM  8.6* 8.8* 8.7*   LFT No results for input(s): PROT, ALBUMIN , AST, ALT, ALKPHOS, BILITOT, BILIDIR, IBILI in the last 72 hours. PT/INR Recent Labs    07/01/24 0203 07/02/24 0404  LABPROT 32.0* 22.0*  INR 2.9* 1.8*        Assessment / Plan:    #85 48 year old African-American male with severe nonischemic cardiomyopathy status post HM 3 LVAD 2022-most recent echo EF less than 20%.  Patient was admitted on 06/29/2024 after being seen outpatient by cardiology with complaints of dizziness and lightheadedness over the past few days found to have a hemoglobin 4.6/macrocytic with supratherapeutic INR 3.8  Patient had been unaware of any melena or hematochezia but had noted stools a little bit darker recently.  Coumadin  has been on hold, INR now down to 1.8  Received 3 units of packed RBCs hemoglobin today 7.6.  He continues to have 1 dark bowel movement per day  Suspect that he has been having subacute GI blood loss over the past couple of months, very likely secondary to AVMs. Patient had previous endoscopic evaluation in 2022 for iron deficiency anemia with EGD and colonoscopy.  At that time colonoscopy with diverticulosis and internal/external hemorrhoids and EGD only with mild gastritis.  And therefore suspect he may have small bowel AVMs.  #2 anemia acute on chronic secondary  to GI blood loss-iron studies are consistent with iron deficiency He will benefit from IV iron infusion this admission, may need repeat infusions outpatient  #3 morbid obesity-on Ozempic -due for dose tomorrow #4 diabetes mellitus #5.  Sleep apnea #6.  History of renal cell CA status post right nephrectomy   Plan; okay for regular diet at lunch, then clear liquids for dinner and n.p.o. past midnight  Continue to trend hemoglobin and transfuse to keep hemoglobin close to 7.5 or above Consider IV iron infusion to  discharge  Please hold Ozempic  until after we complete GI workup  Patient will be scheduled for enteroscopy/small bowel endoscopy with Dr. San for tomorrow 07/03/2024 in AM.  Procedure was discussed in detail with the patient today including indications risks and benefits and he is agreeable to proceed.  Depending on findings at enteroscopy, we will decide if capsule endoscopy indicated.  GI will continue to follow with you     Principal Problem:   GIB (gastrointestinal bleeding)     LOS: 3 days   Giana Castner EsterwoodPA-C  07/02/2024, 9:06 AM

## 2024-07-03 ENCOUNTER — Inpatient Hospital Stay (HOSPITAL_COMMUNITY): Admitting: Anesthesiology

## 2024-07-03 ENCOUNTER — Encounter (HOSPITAL_COMMUNITY)
Admission: AD | Disposition: A | Discharge status: Home / Self Care | Payer: Self-pay | Source: Ambulatory Visit | Attending: Cardiology

## 2024-07-03 ENCOUNTER — Encounter (HOSPITAL_COMMUNITY): Payer: Self-pay | Admitting: Cardiology

## 2024-07-03 DIAGNOSIS — B3781 Candidal esophagitis: Secondary | ICD-10-CM | POA: Diagnosis not present

## 2024-07-03 DIAGNOSIS — N183 Chronic kidney disease, stage 3 unspecified: Secondary | ICD-10-CM

## 2024-07-03 DIAGNOSIS — I5022 Chronic systolic (congestive) heart failure: Secondary | ICD-10-CM | POA: Diagnosis not present

## 2024-07-03 DIAGNOSIS — Z95811 Presence of heart assist device: Secondary | ICD-10-CM | POA: Diagnosis not present

## 2024-07-03 DIAGNOSIS — D509 Iron deficiency anemia, unspecified: Secondary | ICD-10-CM | POA: Diagnosis not present

## 2024-07-03 DIAGNOSIS — T182XXA Foreign body in stomach, initial encounter: Secondary | ICD-10-CM

## 2024-07-03 DIAGNOSIS — K2961 Other gastritis with bleeding: Secondary | ICD-10-CM | POA: Diagnosis not present

## 2024-07-03 DIAGNOSIS — I130 Hypertensive heart and chronic kidney disease with heart failure and stage 1 through stage 4 chronic kidney disease, or unspecified chronic kidney disease: Secondary | ICD-10-CM

## 2024-07-03 DIAGNOSIS — I5023 Acute on chronic systolic (congestive) heart failure: Secondary | ICD-10-CM | POA: Diagnosis not present

## 2024-07-03 DIAGNOSIS — K31819 Angiodysplasia of stomach and duodenum without bleeding: Secondary | ICD-10-CM | POA: Diagnosis not present

## 2024-07-03 DIAGNOSIS — K5520 Angiodysplasia of colon without hemorrhage: Secondary | ICD-10-CM

## 2024-07-03 DIAGNOSIS — K921 Melena: Secondary | ICD-10-CM

## 2024-07-03 DIAGNOSIS — D649 Anemia, unspecified: Secondary | ICD-10-CM

## 2024-07-03 HISTORY — PX: ENTEROSCOPY: SHX5533

## 2024-07-03 LAB — TYPE AND SCREEN
ABO/RH(D): O POS
Antibody Screen: NEGATIVE
Unit division: 0
Unit division: 0
Unit division: 0
Unit division: 0
Unit division: 0
Unit division: 0

## 2024-07-03 LAB — CBC
HCT: 25.8 % — ABNORMAL LOW (ref 39.0–52.0)
Hemoglobin: 7.8 g/dL — ABNORMAL LOW (ref 13.0–17.0)
MCH: 28.6 pg (ref 26.0–34.0)
MCHC: 30.2 g/dL (ref 30.0–36.0)
MCV: 94.5 fL (ref 80.0–100.0)
Platelets: 201 K/uL (ref 150–400)
RBC: 2.73 MIL/uL — ABNORMAL LOW (ref 4.22–5.81)
RDW: 17.6 % — ABNORMAL HIGH (ref 11.5–15.5)
WBC: 5.7 K/uL (ref 4.0–10.5)
nRBC: 0.9 % — ABNORMAL HIGH (ref 0.0–0.2)

## 2024-07-03 LAB — BPAM RBC
Blood Product Expiration Date: 202510252359
Blood Product Expiration Date: 202510252359
Blood Product Expiration Date: 202510252359
Blood Product Expiration Date: 202510252359
Blood Product Expiration Date: 202510252359
Blood Product Expiration Date: 202510252359
ISSUE DATE / TIME: 202509281324
ISSUE DATE / TIME: 202509251801
ISSUE DATE / TIME: 202509252136
ISSUE DATE / TIME: 202509260104
ISSUE DATE / TIME: 202509261133
ISSUE DATE / TIME: 202509271447
Unit Type and Rh: 5100
Unit Type and Rh: 5100
Unit Type and Rh: 5100
Unit Type and Rh: 5100
Unit Type and Rh: 5100
Unit Type and Rh: 5100

## 2024-07-03 LAB — BASIC METABOLIC PANEL WITH GFR
Anion gap: 5 (ref 5–15)
BUN: 27 mg/dL — ABNORMAL HIGH (ref 6–20)
CO2: 23 mmol/L (ref 22–32)
Calcium: 8.5 mg/dL — ABNORMAL LOW (ref 8.9–10.3)
Chloride: 108 mmol/L (ref 98–111)
Creatinine, Ser: 1.79 mg/dL — ABNORMAL HIGH (ref 0.61–1.24)
GFR, Estimated: 46 mL/min — ABNORMAL LOW (ref >60)
Glucose, Bld: 98 mg/dL (ref 70–99)
Potassium: 4.4 mmol/L (ref 3.5–5.1)
Sodium: 136 mmol/L (ref 135–145)

## 2024-07-03 LAB — PROTIME-INR
INR: 1.4 — ABNORMAL HIGH (ref 0.8–1.2)
Prothrombin Time: 18 s — ABNORMAL HIGH (ref 11.4–15.2)

## 2024-07-03 LAB — PREPARE RBC (CROSSMATCH)

## 2024-07-03 LAB — LACTATE DEHYDROGENASE: LDH: 156 U/L (ref 98–192)

## 2024-07-03 SURGERY — ENTEROSCOPY
Anesthesia: Monitor Anesthesia Care

## 2024-07-03 MED ORDER — LIDOCAINE 2% (20 MG/ML) 5 ML SYRINGE
INTRAMUSCULAR | Status: DC | PRN
  Administered 2024-07-03: 100 mg via INTRAVENOUS

## 2024-07-03 MED ORDER — FLUCONAZOLE 200 MG PO TABS
200.0000 mg | ORAL_TABLET | Freq: Once | ORAL | Status: AC
  Administered 2024-07-03: 200 mg via ORAL
  Filled 2024-07-03: qty 1

## 2024-07-03 MED ORDER — PROPOFOL 500 MG/50ML IV EMUL
INTRAVENOUS | Status: DC | PRN
  Administered 2024-07-03: 75 ug/kg/min via INTRAVENOUS

## 2024-07-03 MED ORDER — SODIUM CHLORIDE 0.9% IV SOLUTION
Freq: Once | INTRAVENOUS | Status: AC
  Administered 2024-07-03: 400 mL via INTRAVENOUS

## 2024-07-03 MED ORDER — PHENYLEPHRINE HCL-NACL 20-0.9 MG/250ML-% IV SOLN
INTRAVENOUS | Status: DC | PRN
  Administered 2024-07-03: 50 ug/min via INTRAVENOUS

## 2024-07-03 MED ORDER — PHENYLEPHRINE 80 MCG/ML (10ML) SYRINGE FOR IV PUSH (FOR BLOOD PRESSURE SUPPORT)
PREFILLED_SYRINGE | INTRAVENOUS | Status: DC | PRN
  Administered 2024-07-03 (×4): 160 ug via INTRAVENOUS
  Administered 2024-07-03: 80 ug via INTRAVENOUS

## 2024-07-03 MED ORDER — PROPOFOL 10 MG/ML IV BOLUS
INTRAVENOUS | Status: DC | PRN
Start: 2024-07-03 — End: 2024-07-03
  Administered 2024-07-03: 30 mg via INTRAVENOUS
  Administered 2024-07-03: 40 mg via INTRAVENOUS
  Administered 2024-07-03: 30 mg via INTRAVENOUS

## 2024-07-03 MED ORDER — FLUCONAZOLE 100 MG PO TABS
100.0000 mg | ORAL_TABLET | Freq: Every day | ORAL | Status: DC
Start: 2024-07-04 — End: 2024-07-08
  Administered 2024-07-04 – 2024-07-08 (×5): 100 mg via ORAL
  Filled 2024-07-03 (×5): qty 1

## 2024-07-03 NOTE — Progress Notes (Signed)
 PHARMACY consult - VAD patient   Allergies  Allergen Reactions   Aspirin  Shortness Of Breath, Itching and Rash     Burning sensation (Patient reports he tolerates other NSAIDS)    Bee Venom Hives and Swelling   Lisinopril  Cough and Other (See Comments)   Tomato Hives    Only Fresh tomato     Patient Measurements: Height: 5' 7 (170.2 cm) Weight: 127.9 kg (281 lb 15.5 oz) IBW/kg (Calculated) : 66.1 HEPARIN  DW (KG): 95.5  Vital Signs: Temp: 97.4 F (36.3 C) (09/29 1150) Temp Source: Temporal (09/29 1150) BP: 114/85 (09/29 1200) Pulse Rate: 65 (09/29 1200)  Labs: Recent Labs    07/01/24 0203 07/01/24 1928 07/02/24 0404 07/02/24 1854 07/03/24 0223  HGB 7.3*   < > 7.6* 8.5* 7.8*  HCT 23.5*   < > 24.6* 27.0* 25.8*  PLT 184  --  197  --  201  LABPROT 32.0*  --  22.0*  --  18.0*  INR 2.9*  --  1.8*  --  1.4*  CREATININE 1.82*  --  1.71*  --  1.79*   < > = values in this interval not displayed.    Estimated Creatinine Clearance: 64.1 mL/min (A) (by C-G formula based on SCr of 1.79 mg/dL (H)).   Medical History: Past Medical History:  Diagnosis Date   Anxiety    Aspirin  allergy    Childhood asthma    Chronic systolic CHF (congestive heart failure) (HCC)    a. EF 20-25% in 2012. b. EF 45-50% in 10/2011 with nonischemic nuc - presumed NICM. c. 12/2014 Echo: Sev depressed LV fxn, sev dil LV, mild LVH, mild MR, sev dil LA, mildly reduced RV fxn.   CKD (chronic kidney disease) stage 2, GFR 60-89 ml/min    Depression    H/O vasectomy 12/2019   High cholesterol    Hypertension    Morbid obesity (HCC)    Nephrolithiasis    OSA on CPAP    Paroxysmal atrial fibrillation (HCC)    Presumed NICM    a. 04/2014 Myoview : EF 26%, glob HK, sev glob HK, ? prior infarct;  b. Never cathed 2/2 CKD.   Renal cell carcinoma (HCC)    a. s/p Rt robotic assisted partial converted to radical nephrectomy on 01/2013.   Troponin level elevated    a. 04/2014, 12/2014: felt due to CHF.   Type II  diabetes mellitus (HCC)    Ventricular tachycardia (HCC)    a. appropriate ICD therapy 12/2017    Medications:  Medications Prior to Admission  Medication Sig Dispense Refill Last Dose/Taking   acetaminophen  (TYLENOL ) 325 MG tablet Take 2 tablets (650 mg total) by mouth every 6 (six) hours as needed for fever.   Past Week   allopurinol  (ZYLOPRIM ) 100 MG tablet Take 1 tablet (100 mg total) by mouth daily. 90 tablet 3 06/29/2024 Morning   amiodarone  (PACERONE ) 200 MG tablet Take 1 tablet (200 mg total) by mouth daily. 30 tablet 11 06/29/2024 Morning   amLODipine  (NORVASC ) 10 MG tablet Take 1 tablet (10 mg total) by mouth daily 30 tablet 6 06/29/2024 Morning   carvedilol  (COREG ) 3.125 MG tablet Take 1 tablet (3.125 mg total) by mouth 2 (two) times daily with a meal. 90 tablet 3 06/29/2024 Morning   cefadroxil  (DURICEF) 500 MG capsule Take 1 capsule (500 mg total) by mouth 2 (two) times daily. 180 capsule 3 06/29/2024 Morning   colchicine  0.6 MG tablet Take 1 tablet (0.6 mg total) by mouth  once daily. 30 tablet 6 06/29/2024 Morning   cyanocobalamin  (VITAMIN B12) 1000 MCG tablet Take 1 tablet (1,000 mcg total) by mouth daily. 30 tablet 0 06/29/2024 Morning   cyclobenzaprine  (FLEXERIL ) 5 MG tablet Take 1 tablet (5 mg total) by mouth 3 (three) times daily as needed for muscle spasms 15 tablet 0 Past Month   doxazosin  (CARDURA ) 4 MG tablet Take 2 tablets (8 mg total) by mouth 2 (two) times daily. 120 tablet 6 06/29/2024 Morning   empagliflozin  (JARDIANCE ) 10 MG TABS tablet Take 1 tablet (10 mg total) by mouth daily before breakfast. 30 tablet 11 06/29/2024 Morning   ferrous sulfate  (FEROSUL) 325 (65 FE) MG tablet Take 1 tablet (325 mg total) by mouth daily before breakfast. 90 tablet 1 06/29/2024 Morning   folic acid  (FOLVITE ) 800 MCG tablet Take 0.5 tablets (400 mcg total) by mouth daily. 30 tablet 2 06/29/2024 Morning   gabapentin  (NEURONTIN ) 400 MG capsule Take 1 capsule (400 mg total) by mouth 3 (three) times  daily. 30 capsule 6 06/29/2024 Morning   isosorbide -hydrALAZINE  (BIDIL ) 20-37.5 MG tablet Take 2 tablets by mouth 3 (three) times daily. 180 tablet 6 06/29/2024 Morning   meclizine  (ANTIVERT ) 25 MG tablet Take 1 tablet (25 mg total) by mouth 3 (three) times daily as needed for dizziness. 90 tablet 2 06/29/2024 Morning   ondansetron  (ZOFRAN -ODT) 4 MG disintegrating tablet Take 1 tablet (4 mg total) by mouth every 8 (eight) hours as needed for nausea or vomiting. 5 tablet 0 Past Month   pantoprazole  (PROTONIX ) 40 MG tablet Take 1 tablet (40 mg total) by mouth once daily. 90 tablet 3 06/29/2024 Morning   rosuvastatin  (CRESTOR ) 10 MG tablet Take 1 tablet (10 mg total) by mouth daily. 90 tablet 3 06/29/2024 Morning   Semaglutide , 2 MG/DOSE, 8 MG/3ML SOPN Inject 2 mg as directed once a week. 3 mL 5 05/27/2024   sertraline  (ZOLOFT ) 100 MG tablet Take 1.5 tablets (150 mg total) by mouth daily. 135 tablet 3 06/29/2024 Morning   spironolactone  (ALDACTONE ) 25 MG tablet Take 1 tablet (25 mg total) by mouth daily. 60 tablet 6 06/29/2024 Morning   traMADol  HCl 100 MG TABS Take 1 tablet (100 mg) by mouth every 6 (six) hours as needed for pain 90 tablet 0 Past Week   traZODone  (DESYREL ) 100 MG tablet Take 1 tablet (100 mg total) by mouth at bedtime. 30 tablet 11 06/28/2024 Bedtime   warfarin (COUMADIN ) 4 MG tablet Take 12 mg (3 tabs) every Tuesday and 8 mg (2 tabs) all other days or as directed by HF clinic (Patient taking differently: Take 8-12 mg by mouth See admin instructions. Take 12 mg (3 tabs) by mouth every Tuesday and then take 8 mg (2 tabs) by mouth all other days or as directed by HF clinic) 90 tablet 11 06/28/2024 at  4:00 PM   Blood Pressure KIT 1 Units by Does not apply route daily. 1 kit 0    glucose blood test strip Use as directed 4 times daily. 100 each 11    sacubitril -valsartan  (ENTRESTO ) 97-103 MG Take 1 tablet by mouth 2 (two) times daily. (Patient not taking: Reported on 06/29/2024) 60 tablet 6 Not Taking     Assessment: 48 y.o. 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 Hgb down to 4.6 and notes dark stools past 2-3 days. Pt on warfarin PTA. Admission INR 3.8.  Home dose: warfarin 12 mg (4 mg x 3) every Tue; 8 mg (4  mg x 2) all other days   INR down to 1.4 today - no overt bleeding or complications noted.  S/p endoscopy with esophageal candidiasis, no bleeding - starting fluconazole .    Goal of Therapy:  INR 2-2.5 Monitor platelets by anticoagulation protocol: Yes   Plan:  Hold warfarin until 9/30 per GI. Daily CBC, INR Will need to watch INR closely with interaction with fluconazole  once warfarin restarted.  Harlene Barlow, [employer], BCCP Clinical Pharmacist  07/03/2024 12:37 PM   [facility] pharmacy phone numbers are listed on [email or web address]

## 2024-07-03 NOTE — Progress Notes (Addendum)
 LVAD Coordinator Rounding Note:   Admitted 06/29/24 from VAD Clinic for suspected GIB with a critical Hgb 4.6. Pt seen in VAD Clinic for sick visit with complaints of dizziness and lightheadedness for approximately 3 days. Pt states he noticed his stool was darker but denies any signs of acute bleeding.    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 Lucas under Destination Therapy criteria due to BMI and chronic kidney disease.   Pt lying in bed this morning. No complaints.   GI consulted. Recommendations for Enteroscopy today at 0930. INR today 1.4. Hgb 7.8 this morning. 1 unit ordered this morning by provider team.    Vital signs: Temp: 97.6  HR: 62 NSR Doppler: 102 Automatic cuff: 125/73 (80) O2 Sat: 99% on RA Wt: 276.9>281.9 lbs  LVAD interrogation reveals:  Speed: 6300 Flow: 5 Power:  5w PI: 3.8 Hct: 25   Alarms:  none Events: 4 today; 20 yesterday   Fixed speed:  6300 Low speed limit: 6000   Drive Line: Existing drive line dressing CDI. Anchor secure. Next dressing change due 07/04/24 by bedside nurse.  Labs:  LDH trend: 147>156  INR trend: 3.6>1.4  Hgb: 7.0>7.8  Anticoagulation Plan: -INR Goal: 2.0 - 2.5  -ASA Dose: none due to allergy   Blood Products:  9/25: 2 PRBC 9/26: 2 PRBC 9/27: 1 PRBC 9/28: 1 PRBC 9/29: 1 PRBC  Device: - [employer] single ICD -Therapies: on per BS rep on 08/06/21   Infection:   Drips:    Plan/Recommendations:  Call VAD Coordinator if any VAD equipment or drive line issues. VAD coordinator will accompany pt to endo today  Lauraine Ip RN,BSN VAD Coordinator  Office: [phone]  24/7 Pager: [phone]

## 2024-07-03 NOTE — Plan of Care (Signed)
  Problem: Education: Goal: Patient will understand all VAD equipment and how it functions Outcome: Progressing Goal: Patient will be able to verbalize current INR target range and antiplatelet therapy for discharge home Outcome: Progressing   Problem: Cardiac: Goal: LVAD will function as expected and patient will experience no clinical alarms Outcome: Progressing   Problem: Education: Goal: Patient will understand all VAD equipment and how it functions Outcome: Progressing Goal: Patient will be able to verbalize current INR target range and antiplatelet therapy for discharge home Outcome: Progressing   Problem: Cardiac: Goal: LVAD will function as expected and patient will experience no clinical alarms Outcome: Progressing   Problem: Education: Goal: Knowledge of General Education information will improve Description: Including pain rating scale, medication(s)/side effects and non-pharmacologic comfort measures Outcome: Progressing   Problem: Health Behavior/Discharge Planning: Goal: Ability to manage health-related needs will improve Outcome: Progressing   Problem: Clinical Measurements: Goal: Ability to maintain clinical measurements within normal limits will improve Outcome: Progressing Goal: Will remain free from infection Outcome: Progressing Goal: Diagnostic test results will improve Outcome: Progressing Goal: Respiratory complications will improve Outcome: Progressing Goal: Cardiovascular complication will be avoided Outcome: Progressing   Problem: Activity: Goal: Risk for activity intolerance will decrease Outcome: Progressing   Problem: Nutrition: Goal: Adequate nutrition will be maintained Outcome: Progressing   Problem: Coping: Goal: Level of anxiety will decrease Outcome: Progressing   Problem: Elimination: Goal: Will not experience complications related to bowel motility Outcome: Progressing Goal: Will not experience complications related to  urinary retention Outcome: Progressing   Problem: Pain Managment: Goal: General experience of comfort will improve and/or be controlled Outcome: Progressing   Problem: Safety: Goal: Ability to remain free from injury will improve Outcome: Progressing   Problem: Skin Integrity: Goal: Risk for impaired skin integrity will decrease Outcome: Progressing

## 2024-07-03 NOTE — Interval H&P Note (Signed)
 History and Physical Interval Note:  Repeat H/H 7.8/25.8 this morning.  Will give 1 additional unit RBCs prior to procedure.  INR 1.4.  Still with dark stools.  Plan to proceed with post enteroscopy for diagnostic and therapeutic intent today.  If enteroscopy unremarkable, tentatively plan for VCE for further small bowel interrogation.  07/03/2024 9:20 AM  Jeffrey [name]  has presented today for surgery, with the diagnosis of profound anemia, Fe deficiecnay , heme positive stool in setting of LVAD and Coumadin .  The various methods of treatment have been discussed with the patient and family. After consideration of risks, benefits and other options for treatment, the patient has consented to  Procedure(s) with comments: ENTEROSCOPY (N/A) - LVAD pt - will need LVAD nurse as a surgical intervention.  The patient's history has been reviewed, patient examined, no change in status, stable for surgery.  I have reviewed the patient's chart and labs.  Questions were answered to the patient's satisfaction.     Jeffrey [name]

## 2024-07-03 NOTE — Plan of Care (Signed)
  Problem: Education: Goal: Patient will understand all VAD equipment and how it functions Outcome: Progressing Goal: Patient will be able to verbalize current INR target range and antiplatelet therapy for discharge home Outcome: Progressing   Problem: Cardiac: Goal: LVAD will function as expected and patient will experience no clinical alarms Outcome: Progressing   Problem: Education: Goal: Patient will understand all VAD equipment and how it functions Outcome: Progressing   Problem: Education: Goal: Knowledge of General Education information will improve Description: Including pain rating scale, medication(s)/side effects and non-pharmacologic comfort measures Outcome: Progressing   Problem: Health Behavior/Discharge Planning: Goal: Ability to manage health-related needs will improve Outcome: Progressing   Problem: Clinical Measurements: Goal: Ability to maintain clinical measurements within normal limits will improve Outcome: Progressing   Problem: Activity: Goal: Risk for activity intolerance will decrease Outcome: Progressing   Problem: Nutrition: Goal: Adequate nutrition will be maintained Outcome: Progressing   Problem: Pain Managment: Goal: General experience of comfort will improve and/or be controlled Outcome: Progressing

## 2024-07-03 NOTE — Anesthesia Preprocedure Evaluation (Addendum)
 Anesthesia Evaluation  Patient identified by MRN, date of birth, ID band Patient awake    Reviewed: Allergy & Precautions, NPO status , Patient's Chart, lab work & pertinent test results, reviewed documented beta blocker date and time   Airway Mallampati: I  TM Distance: >3 FB Neck ROM: Full    Dental  (+) Teeth Intact, Dental Advisory Given, Caps   Pulmonary asthma , sleep apnea and Continuous Positive Airway Pressure Ventilation , pneumonia, resolved, former smoker   Pulmonary exam normal breath sounds clear to auscultation       Cardiovascular hypertension, +CHF  + dysrhythmias Atrial Fibrillation and Ventricular Tachycardia + Cardiac Defibrillator  Rhythm:Irregular Rate:Normal + Systolic murmurs LVAD Hx/o NICM LVEF <20%  Echo 05/02/24 1. LVAD cannula well positioned in apex. Left ventricular ejection  fraction, by estimation, is <20%. The left ventricle has severely  decreased function. The left ventricle demonstrates global hypokinesis.  The left ventricular internal cavity size was  severely dilated.   2. Right ventricular systolic function is moderately reduced.   3. Trivial mitral valve regurgitation.   4. Aortic valve does not open in setting of VAD support. The aortic valve  is normal in structure.     Neuro/Psych  PSYCHIATRIC DISORDERS Anxiety Depression    negative neurological ROS     GI/Hepatic Neg liver ROS,,,Heme + stools   Endo/Other  diabetes, Well Controlled, Type 2, Oral Hypoglycemic Agents  Class 3 obesityHLD Gout On Jardiance   Renal/GU Renal InsufficiencyRenal diseaseHx/ right radical nephrectomy 2014 for RCC Hx/o renal calculi  negative genitourinary   Musculoskeletal  (+) Arthritis , Osteoarthritis,    Abdominal  (+) + obese  Peds  Hematology  (+) Blood dyscrasia, anemia HB 7.8 this am- being tranfused   Anesthesia Other Findings   Reproductive/Obstetrics                               Anesthesia Physical Anesthesia Plan  ASA: 4  Anesthesia Plan: MAC   Post-op Pain Management: Minimal or no pain anticipated   Induction: Intravenous  PONV Risk Score and Plan: Propofol  infusion and Treatment may vary due to age or medical condition  Airway Management Planned: Natural Airway, Simple Face Mask and Nasal Cannula  Additional Equipment: Arterial line and None  Intra-op Plan:   Post-operative Plan: Extubation in OR  Informed Consent: I have reviewed the patients History and Physical, chart, labs and discussed the procedure including the risks, benefits and alternatives for the proposed anesthesia with the patient or authorized representative who has indicated his/her understanding and acceptance.     Dental advisory given  Plan Discussed with: Anesthesiologist and CRNA  Anesthesia Plan Comments: (Will start blood transfusion before proceeding)         Anesthesia Quick Evaluation

## 2024-07-03 NOTE — Anesthesia Postprocedure Evaluation (Signed)
 Anesthesia Post Note  Patient: Jeffrey [name] [place]  Procedure(s) Performed: ENTEROSCOPY     Patient location during evaluation: PACU Anesthesia Type: MAC Level of consciousness: awake and alert and oriented Pain management: pain level controlled Vital Signs Assessment: post-procedure vital signs reviewed and stable Respiratory status: spontaneous breathing, nonlabored ventilation and respiratory function stable Cardiovascular status: stable and blood pressure returned to baseline Postop Assessment: no apparent nausea or vomiting Anesthetic complications: no   No notable events documented.  Last Vitals:  Vitals:   07/03/24 1200 07/03/24 1243  BP: 114/85 109/78  Pulse: 65 60  Resp: 12 17  Temp:  36.7 C  SpO2: 99% 96%    Last Pain:  Vitals:   07/03/24 1243  TempSrc: Oral  PainSc:                  [name]

## 2024-07-03 NOTE — Progress Notes (Signed)
 VAD Coordinator Procedure Note:   VAD Coordinator met patient in endoscopy. Pt undergoing enteroscopy per Dr. San. Hemodynamics and VAD parameters monitored by myself and anesthesia throughout the procedure. Blood pressures were obtained with automatic cuff on left arm.    Time: Doppler Auto  BP Flow PI Power Speed  Pre-procedure:  1057  96/80(88) 5 3.7 5.1 6300                    Sedation Induction: 1109  125/106 (113) 5.1 3.4 5.1 6300   1115  101/81(88) 5 4.2 5.1 6300   1130  107/78(88) 5.3 4.1 5.2 6300   1140  97/66(76) 5.2 4 5  6300  Recovery Area: 1150  105/90(97) 4.9 4 5  6300   1200  114/86(96) 5.1 3.6 5.1 6300    Patient tolerated the procedure well. VAD Coordinator accompanied and remained with patient in recovery area.    Patient Disposition: pt transported to [facility] on batteries and handoff given to [place], Charity fundraiser.  Lauraine Ip RN, BSN VAD Coordinator 24/7 Pager [phone]

## 2024-07-03 NOTE — Op Note (Signed)
 [facility] Patient Name: Jeffrey [name] Procedure Date : 07/03/2024 MRN: [id number] Attending MD: Sandor Flatter , MD, [id number] Date of Birth: May 31, 1976 CSN: [id number] Age: 48 Admit Type: Inpatient Procedure:                Small bowel enteroscopy Indications:              Iron deficiency anemia, Dark stools Providers:                Sandor Flatter, MD, Hoy jenkins Penner, RN,                            Haskel Chris, Technician Referring MD:              Medicines:                Monitored Anesthesia Care Complications:            No immediate complications. Estimated Blood Loss:     Estimated blood loss was minimal. Procedure:                Pre-Anesthesia Assessment:                           - Prior to the procedure, a History and Physical                            was performed, and patient medications and                            allergies were reviewed. The patient's tolerance of                            previous anesthesia was also reviewed. The risks                            and benefits of the procedure and the sedation                            options and risks were discussed with the patient.                            All questions were answered, and informed consent                            was obtained. Prior Anticoagulants: The patient has                            taken Coumadin  (warfarin), last dose was 2 days                            prior to procedure. ASA Grade Assessment: IV - A                            patient with severe systemic disease that is a  constant threat to life. After reviewing the risks                            and benefits, the patient was deemed in                            satisfactory condition to undergo the procedure.                           After obtaining informed consent, the endoscope was                            passed under direct vision. Throughout the                             procedure, the patient's blood pressure, pulse, and                            oxygen saturations were monitored continuously. The                            [id number]-HQ190L ([id number]) Olympus colonoscope was                            introduced through the mouth and advanced to the                            proximal jejunum. The small bowel enteroscopy was                            accomplished without difficulty. The patient                            tolerated the procedure well. Scope In: Scope Out: Findings:      Diffuse, white plaques were found in the entire esophagus. Biopsies were       taken with a cold forceps for histology. Estimated blood loss was       minimal.      The Z-line was otherwise regular and was found 44 cm from the incisors.      A medium amount of food (residue) was found in the gastric fundus and in       the gastric body. This was lavaged and cleared with adequate       visualization.      Normal mucosa was found in the entire examined stomach.      Three angioectasias with no bleeding were found in the third portion of       the duodenum and in the fourth portion of the duodenum. Coagulation for       hemostasis using argon plasma was successful. Estimated blood loss was       minimal.      The mucosa was otherwise normal-appearing and the visualized small bowel       to the proximal jejunum. Impression:               - Esophageal plaques were found, suspicious for  candidiasis. Biopsied.                           - Z-line regular, 44 cm from the incisors.                           - A medium amount of food (residue) in the stomach.                           - Normal mucosa was found in the entire stomach.                           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                            Treated with argon plasma coagulation (APC).                           - The examined portion of the small bowel  was                            otherwise normal. Recommendation:           - Return patient to hospital ward for ongoing care.                           - Await pathology results.                           - Diflucan  (fluconazole ) 200 mg on day 1 then 100                            mg PO daily for 2 weeks. Discussed with LVAD team                            regarding INR monitoring while on diflucan . Will                            opt for shorter 2 week course (vs the typical 3                            weeks).                           - Continue serial CBC checks with additional blood                            products as needed per protocol                           - If continued or recurrent anemia, plan for video                            capsule endoscopy for further small bowel  interrogation.                           - Will benefit from IV iron as outpatient.                           - Can restart anticoagulation tomorrow with close                            monitoring. Procedure Code(s):        --- Professional ---                           [phone], 59, Small intestinal endoscopy, enteroscopy                            beyond second portion of duodenum, not including                            ileum; with control of bleeding (eg, injection,                            bipolar cautery, unipolar cautery, laser, heater                            probe, stapler, plasma coagulator)                           44361, Small intestinal endoscopy, enteroscopy                            beyond second portion of duodenum, not including                            ileum; with biopsy, single or multiple Diagnosis Code(s):        --- Professional ---                           K22.9, Disease of esophagus, unspecified                           K31.819, Angiodysplasia of stomach and duodenum                            without bleeding                           D50.9,  Iron deficiency anemia, unspecified CPT copyright 2022 [employer]. All rights reserved. The codes documented in this report are preliminary and upon coder review may  be revised to meet current compliance requirements. Sandor Flatter, MD 07/03/2024 12:02:46 PM Number of Addenda: 0

## 2024-07-03 NOTE — Progress Notes (Addendum)
 Patient ID: Jeffrey [name], male   DOB: [birth year], 48 y.o.   MRN: [id number]   Advanced Heart Failure VAD Team Note  PCP-Cardiologist: Toribio Fuel, MD  Chief Complaint: GIB Subjective:    Hgb 4.6 on admission. Heme+. Transfused 7uRBCs thus far. Hgb 8.5 yesterday>>7.6 today. Still w/ dark stools as of this morning. INR 1.4.   EGD today showed 3 AVMs (treated) + severe/diffuse candida   MAPs 80s   Feels well post EGD. Hungry. Denies CP and dyspnea.   LVAD INTERROGATION:  HeartMate III LVAD:   Flow 4.9 liters/min, speed 6300, power 5.1, PI 3.5   Objective:    Vital Signs:   Temp:  [97.5 F (36.4 C)-98.6 F (37 C)] 97.5 F (36.4 C) (09/29 1045) Pulse Rate:  [58-64] 61 (09/29 1059) Resp:  [11-25] 16 (09/29 1059) BP: (80-129)/(31-98) 96/80 (09/29 1045) SpO2:  [65 %-100 %] 99 % (09/29 1059) FiO2 (%):  [21 %] 21 % (09/28 2301) Weight:  [127.9 kg] 127.9 kg (09/29 0628) Last BM Date : 07/02/24 Mean arterial Pressure 80s   Intake/Output:   Intake/Output Summary (Last 24 hours) at 07/03/2024 1134 Last data filed at 07/03/2024 1059 Gross per 24 hour  Intake 1749.33 ml  Output 925 ml  Net 824.33 ml    Physical Exam    General: well appearing, sitting up in bed. NAD  Neck: supple. No JVD   Cardiac:  Mechanical heart sounds with LVAD hum present.  Lungs:  clear  Abdomen: obese, soft, NT, ND  LVAD exit site: Well-healed and incorporated. Dressing dry and intact. No erythema or drainage. Stabilization device present and accurately applied. Driveline dressing changed daily per sterile technique. Extremities:  warm and dry. No LEE  Neuro:  A&Ox3. Affect pleasant     Telemetry   NSR 60s (Personally reviewed)    Labs   Basic Metabolic Panel: Recent Labs  Lab 06/29/24 1251 06/30/24 0529 07/01/24 0203 07/02/24 0404 07/03/24 0223  NA 137 138 137 137 136  K 4.2 4.2 4.1 4.1 4.4  CL 108 109 105 109 108  CO2 21* 23 22 24 23   GLUCOSE 145* 98 106* 114* 98  BUN 28* 31* 31*  31* 27*  CREATININE 1.80* 1.84* 1.82* 1.71* 1.79*  CALCIUM  8.9 8.6* 8.8* 8.7* 8.5*    Liver Function Tests: No results for input(s): AST, ALT, ALKPHOS, BILITOT, PROT, ALBUMIN  in the last 168 hours. No results for input(s): LIPASE, AMYLASE in the last 168 hours. No results for input(s): AMMONIA in the last 168 hours.  CBC: Recent Labs  Lab 06/30/24 0529 06/30/24 1800 07/01/24 0203 07/01/24 1928 07/02/24 0404 07/02/24 1854 07/03/24 0223  WBC 7.2 7.1 6.7  --  5.4  --  5.7  HGB 7.0* 7.7* 7.3* 8.1* 7.6* 8.5* 7.8*  HCT 22.1* 24.9* 23.5* 25.6* 24.6* 27.0* 25.8*  MCV 96.1 95.0 95.1  --  93.9  --  94.5  PLT 183 198 184  --  197  --  201    INR: Recent Labs  Lab 06/29/24 1251 06/30/24 0529 07/01/24 0203 07/02/24 0404 07/03/24 0223  INR 3.8* 3.6* 2.9* 1.8* 1.4*    Other results: EKG:    Imaging   No results found.   Medications:     Scheduled Medications:  [MAR Hold] allopurinol   100 mg Oral Daily   [MAR Hold] amiodarone   200 mg Oral Daily   [MAR Hold] cefadroxil   500 mg Oral BID   [MAR Hold] colchicine   0.6 mg Oral Daily   [  MAR Hold] cyanocobalamin   1,000 mcg Oral Daily   [MAR Hold] folic acid   500 mcg Oral Daily   [MAR Hold] gabapentin   400 mg Oral TID   [MAR Hold] isosorbide -hydrALAZINE   2 tablet Oral TID   [MAR Hold] pantoprazole  (PROTONIX ) IV  40 mg Intravenous BID   [MAR Hold] rosuvastatin   10 mg Oral Daily   [MAR Hold] sertraline   150 mg Oral Daily   [MAR Hold] traZODone   100 mg Oral QHS    Infusions:   PRN Medications: [MAR Hold] acetaminophen , [MAR Hold] HYDROcodone -acetaminophen    Patient Profile   Jeffrey [name] is a 48 y.o. yo male with a history of  poorly controlled HTN, R renal cell carcinoma s/p nephrectomy, CKD IIIa, DM2, OSA, gout, morbid obesity and systolic HF due to NICM. 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 Down to 4.6 at admission, 7.8 today.  Has had 7 units PRBCs (1u  transfused today).   - Stool black, heme+.  - INR 3.8 on admission. 1.4 today. - Small bowel enteroscopy today showed 3 AVMs (treated w/ APC) + severe diffuse candida  - Continue protonix  40 IV BID - Holding warfarin. ? Resume tonight if ok w/ GI  - Iron stores low. Plan for IV iron OP with ongoing blood tx's.    2. Chronic systolic CHF - Nonischemic CM, end-stage - EF 2012; 20-25%  - Echo  8/22: EF < 20%, severe LV dilation, moderate RV dysfunction.   - RHC (8/22): well compensated hemodynamics. - Echo 04/2024: Mild RV dysfunction, LVEF <20%, severe LV dilation - s/p S-ICD - S/p HM3 LVAD 07/21/21 (DT) - NYHA I-II. Volume ok on exam  - MAPs 80s. Continue Bidil   - Aware of need to get to BMI ~35 to permit transplant  BMI >40. Now back on Ozempic . Significant weight loss and steadily improving - Last Ramp echo 7/25: speed 6300   3. VAD  - s/p HM-3 placement 07/21/21  - VAD interrogated as above - Warfarin on hold with GI bleed and need for small bowel enteroscopy.  - LDH 160>147>159>151>>156  - DL site looks stable,  Continue cefadroxil  for suppression   4. DL site infection - Resolved - Continue cefadroxil  for suppression, appreciate ID   5 Atrial fibrillation: Paroxysmal - Had severe AF with RVR 7/22 s/p DC-CV x 2 - Seen by EP. Not a candidate for ablation due to body habitus  (could consider AVN ablation and CRT, if needed)  - Had post-op AF with RVR post VAD - 10/23 S/P DC-CV with restoration of NSR. - Continue amio 200 mg daily - SB-NSR - Warfarin on hold   6. HTN - MAPs 80s - GDMT per above   7. CKD stage 3B  - Has solitary kidney.   - Baseline creatinine is 1.5-1.8.  - SCr stable today, 1.79  - Off diuretics. Avoid hypotension   8. OSA - Continue CPAP   9. 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 Depression/anxiety - Continue Zoloft  150mg  daily -  Referral to psych made OP   12. Morbid obesity - Body mass index is 44.16 kg/m. - Continue Ozempic .   13. Esophageal Candida  - seen on EGD, biopsied  - per GI, will need Diflucan  x 2 wks   I reviewed the LVAD parameters from today, and  compared the results to the patient's prior recorded data.  No programming changes were made.  The LVAD is functioning within specified parameters.  The patient performs LVAD self-test daily.  LVAD interrogation was negative for any significant power changes, alarms or PI events/speed drops.  LVAD equipment check completed and is in good working order.  Back-up equipment present.   LVAD education done on emergency procedures and precautions and reviewed exit site care.  Length of Stay: [address] [place] 07/03/2024, 11:34 AM  VAD Team --- VAD ISSUES ONLY--- Pager [phone] (7am - 7am)  Advanced Heart Failure Team  Pager [phone] (M-F; 7a - 5p)  Please contact [facility] for night-coverage after hours (5p -7a ) and weekends on [email or web address]  Patient seen and examined with the above-signed Advanced Practice Provider and/or Housestaff. I personally reviewed laboratory data, imaging studies and relevant notes. I independently examined the patient and formulated the important aspects of the plan. I have edited the note to reflect any of my changes or salient points. I have personally discussed the plan with the patient and/or family.  EGD today with several AVMs and heavy burden of candida. No overt bleeding but hgb continue to drift down slowly.   Off warfarin INR 1.4  Denies CP or SOB. Remains in NSR  General:  NAD.  HEENT: normal  Neck: supple. JVP not elevated.  Carotids 2+ bilat; no bruits. No lymphadenopathy or thryomegaly appreciated. Cor: LVAD hum.  Lungs: Clear. Abdomen: obese soft, nontender, non-distended. No hepatosplenomegaly. No bruits or masses. Good bowel sounds. Driveline site clean. Anchor in place.  Extremities: no cyanosis,  clubbing, rash. Warm no edema  Neuro: alert & oriented x 3. No focal deficits. Moves all 4 without problem   Results of EGD d/w him personally. Continue to trend Hgb. Can likely restart warfarin tomorrow +/- low-dose heparin .   Will treat with extended course of fluconazole .   VAD interrogated personally. Parameters stable.  Toribio Fuel, MD  10:41 PM

## 2024-07-03 NOTE — Transfer of Care (Signed)
 Immediate Anesthesia Transfer of Care Note  Patient: Jeffrey [name]  Procedure(s) Performed: ENTEROSCOPY  Patient Location: PACU and Endoscopy Unit  Anesthesia Type:MAC  Level of Consciousness: awake and patient cooperative  Airway & Oxygen Therapy: Patient Spontanous Breathing  Post-op Assessment: Report given to RN and Post -op Vital signs reviewed and stable  Post vital signs: Reviewed and stable  Last Vitals:  Vitals Value Taken Time  BP 105/90 07/03/24 11:50  Temp    Pulse 76 07/03/24 11:51  Resp 20 07/03/24 11:51  SpO2 95 % 07/03/24 11:51  Vitals shown include unfiled device data.  Last Pain:  Vitals:   07/03/24 1045  TempSrc: Temporal  PainSc: 0-No pain      Patients Stated Pain Goal: 0 (07/01/24 2000)  Complications: No notable events documented.

## 2024-07-04 ENCOUNTER — Other Ambulatory Visit (HOSPITAL_COMMUNITY): Payer: Self-pay

## 2024-07-04 ENCOUNTER — Telehealth (HOSPITAL_COMMUNITY): Payer: Self-pay | Admitting: Pharmacy Technician

## 2024-07-04 DIAGNOSIS — Z95811 Presence of heart assist device: Secondary | ICD-10-CM | POA: Diagnosis not present

## 2024-07-04 DIAGNOSIS — K31819 Angiodysplasia of stomach and duodenum without bleeding: Secondary | ICD-10-CM | POA: Diagnosis not present

## 2024-07-04 DIAGNOSIS — B3781 Candidal esophagitis: Secondary | ICD-10-CM | POA: Diagnosis not present

## 2024-07-04 DIAGNOSIS — D509 Iron deficiency anemia, unspecified: Secondary | ICD-10-CM | POA: Diagnosis not present

## 2024-07-04 LAB — TYPE AND SCREEN
ABO/RH(D): O POS
Antibody Screen: NEGATIVE
Unit division: 0

## 2024-07-04 LAB — BPAM RBC
Blood Product Expiration Date: 202510262359
ISSUE DATE / TIME: 202509291029
Unit Type and Rh: 5100

## 2024-07-04 LAB — BASIC METABOLIC PANEL WITH GFR
Anion gap: 8 (ref 5–15)
BUN: 22 mg/dL — ABNORMAL HIGH (ref 6–20)
CO2: 22 mmol/L (ref 22–32)
Calcium: 8.6 mg/dL — ABNORMAL LOW (ref 8.9–10.3)
Chloride: 108 mmol/L (ref 98–111)
Creatinine, Ser: 1.64 mg/dL — ABNORMAL HIGH (ref 0.61–1.24)
GFR, Estimated: 51 mL/min — ABNORMAL LOW (ref >60)
Glucose, Bld: 131 mg/dL — ABNORMAL HIGH (ref 70–99)
Potassium: 4.4 mmol/L (ref 3.5–5.1)
Sodium: 138 mmol/L (ref 135–145)

## 2024-07-04 LAB — CBC
HCT: 27.1 % — ABNORMAL LOW (ref 39.0–52.0)
Hemoglobin: 8.6 g/dL — ABNORMAL LOW (ref 13.0–17.0)
MCH: 29.6 pg (ref 26.0–34.0)
MCHC: 31.7 g/dL (ref 30.0–36.0)
MCV: 93.1 fL (ref 80.0–100.0)
Platelets: 227 K/uL (ref 150–400)
RBC: 2.91 MIL/uL — ABNORMAL LOW (ref 4.22–5.81)
RDW: 16.6 % — ABNORMAL HIGH (ref 11.5–15.5)
WBC: 7.6 K/uL (ref 4.0–10.5)
nRBC: 0.3 % — ABNORMAL HIGH (ref 0.0–0.2)

## 2024-07-04 LAB — LACTATE DEHYDROGENASE: LDH: 169 U/L (ref 98–192)

## 2024-07-04 LAB — SURGICAL PATHOLOGY

## 2024-07-04 LAB — PROTIME-INR
INR: 1.2 (ref 0.8–1.2)
Prothrombin Time: 16 s — ABNORMAL HIGH (ref 11.4–15.2)

## 2024-07-04 MED ORDER — HYDROCODONE-ACETAMINOPHEN 7.5-325 MG PO TABS
1.0000 | ORAL_TABLET | Freq: Four times a day (QID) | ORAL | Status: DC | PRN
  Administered 2024-07-04 – 2024-07-07 (×3): 1 via ORAL
  Filled 2024-07-04 (×3): qty 1

## 2024-07-04 MED ORDER — WARFARIN SODIUM 3 MG PO TABS
6.0000 mg | ORAL_TABLET | Freq: Once | ORAL | Status: AC
  Administered 2024-07-04: 6 mg via ORAL
  Filled 2024-07-04: qty 2

## 2024-07-04 MED ORDER — WARFARIN - PHARMACIST DOSING INPATIENT
Freq: Every day | Status: DC
Start: 2024-07-05 — End: 2024-07-08

## 2024-07-04 MED ORDER — SACUBITRIL-VALSARTAN 24-26 MG PO TABS
1.0000 | ORAL_TABLET | Freq: Two times a day (BID) | ORAL | Status: DC
Start: 2024-07-04 — End: 2024-07-05
  Administered 2024-07-04 – 2024-07-05 (×2): 1 via ORAL
  Filled 2024-07-04 (×2): qty 1

## 2024-07-04 NOTE — Plan of Care (Signed)
  Problem: Education: Goal: Patient will understand all VAD equipment and how it functions Outcome: Progressing Goal: Patient will be able to verbalize current INR target range and antiplatelet therapy for discharge home Outcome: Progressing   Problem: Cardiac: Goal: LVAD will function as expected and patient will experience no clinical alarms Outcome: Progressing   Problem: Education: Goal: Patient will understand all VAD equipment and how it functions Outcome: Progressing   Problem: Education: Goal: Knowledge of General Education information will improve Description: Including pain rating scale, medication(s)/side effects and non-pharmacologic comfort measures Outcome: Progressing   Problem: Health Behavior/Discharge Planning: Goal: Ability to manage health-related needs will improve Outcome: Progressing   Problem: Nutrition: Goal: Adequate nutrition will be maintained Outcome: Progressing   Problem: Coping: Goal: Level of anxiety will decrease Outcome: Progressing   Problem: Elimination: Goal: Will not experience complications related to bowel motility Outcome: Progressing Goal: Will not experience complications related to urinary retention Outcome: Progressing   Problem: Pain Managment: Goal: General experience of comfort will improve and/or be controlled Outcome: Progressing

## 2024-07-04 NOTE — Telephone Encounter (Signed)
 Patient Product/process development scientist completed.    The patient is insured through [facility].     Ran test claim for Entresto 24-26 mg and the current 30 day co-pay is $4.00.   This test claim was processed through [facility]- copay amounts may vary at other pharmacies due to pharmacy/plan contracts, or as the patient moves through the different stages of their insurance plan.     Reyes Sharps, CPHT Pharmacy Technician III Certified Patient Advocate [facility] Patient Advocate Team Direct Number: [phone]  Fax: [phone]

## 2024-07-04 NOTE — Progress Notes (Cosign Needed Addendum)
 Patient ID: Jeffrey [name], male   DOB: 09/08/1976, 48 y.o.   MRN: [id number]   Advanced Heart Failure VAD Team Note  PCP-Cardiologist: Toribio Fuel, MD   Chief Complaint: GIB  Subjective:    Hgb 4.6 on admission. Heme+. Transfused 8 u RBCs so far (most recent unit 09/29). Hgb 8.6 today.  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  MAP 90s  INR 1.2.  Driveline sore from dressing change over the weekend. Otherwise, feeling well. Stool is not as dark as days prior.  LVAD INTERROGATION:  HeartMate III LVAD:   Flow 5 liters/min, speed 6300, power 5.1, PI 3.2  Objective:    Vital Signs:   Temp:  [97.4 F (36.3 C)-98.8 F (37.1 C)] 98 F (36.7 C) (09/30 0732) Pulse Rate:  [59-90] 60 (09/30 0732) Resp:  [11-19] 19 (09/30 0732) BP: (96-114)/(67-96) 111/96 (09/30 0732) SpO2:  [91 %-100 %] 100 % (09/30 0548) Weight:  [128.8 kg] 128.8 kg (09/30 0548) Last BM Date : 07/03/24 Mean arterial Pressure 90s  Intake/Output:   Intake/Output Summary (Last 24 hours) at 07/04/2024 0912 Last data filed at 07/04/2024 0500 Gross per 24 hour  Intake 561.66 ml  Output 800 ml  Net -238.34 ml    Physical Exam    Physical Exam: GENERAL: Sitting up in bed. No distress. CARDIAC:  No JVD. Mechanical heart sounds with LVAD hum present.  LUNGS:  Breathing nonlabored.  ABDOMEN:  Obese, soft, round, nontender.     LVAD exit site:   Dressing dry and intact.  Stabilization device present and accurately applied.   EXTREMITIES: No edema  NEUROLOGIC:  Alert and oriented x 4.  Affect pleasant.       Telemetry   SR 60s  Labs   Basic Metabolic Panel: Recent Labs  Lab 06/30/24 0529 07/01/24 0203 07/02/24 0404 07/03/24 0223 07/04/24 0232  NA 138 137 137 136 138  K 4.2 4.1 4.1 4.4 4.4  CL 109 105 109 108 108  CO2 23 22 24 23 22   GLUCOSE 98 106* 114* 98 131*  BUN 31* 31* 31* 27* 22*  CREATININE 1.84* 1.82* 1.71* 1.79* 1.64*   CALCIUM  8.6* 8.8* 8.7* 8.5* 8.6*    Liver Function Tests: No results for input(s): AST, ALT, ALKPHOS, BILITOT, PROT, ALBUMIN  in the last 168 hours. No results for input(s): LIPASE, AMYLASE in the last 168 hours. No results for input(s): AMMONIA in the last 168 hours.  CBC: Recent Labs  Lab 06/30/24 1800 07/01/24 0203 07/01/24 1928 07/02/24 0404 07/02/24 1854 07/03/24 0223 07/04/24 0232  WBC 7.1 6.7  --  5.4  --  5.7 7.6  HGB 7.7* 7.3* 8.1* 7.6* 8.5* 7.8* 8.6*  HCT 24.9* 23.5* 25.6* 24.6* 27.0* 25.8* 27.1*  MCV 95.0 95.1  --  93.9  --  94.5 93.1  PLT 198 184  --  197  --  201 227    INR: Recent Labs  Lab 06/30/24 0529 07/01/24 0203 07/02/24 0404 07/03/24 0223 07/04/24 0232  INR 3.6* 2.9* 1.8* 1.4* 1.2    Other results: EKG:    Imaging   No results found.   Medications:     Scheduled Medications:  allopurinol   100 mg Oral Daily   amiodarone   200 mg Oral Daily   cefadroxil   500 mg Oral BID   colchicine   0.6 mg Oral Daily   cyanocobalamin   1,000 mcg Oral Daily   fluconazole   100 mg Oral Daily  folic acid   500 mcg Oral Daily   gabapentin   400 mg Oral TID   isosorbide -hydrALAZINE   2 tablet Oral TID   pantoprazole  (PROTONIX ) IV  40 mg Intravenous BID   rosuvastatin   10 mg Oral Daily   sertraline   150 mg Oral Daily   traZODone   100 mg Oral QHS    Infusions:   PRN Medications: acetaminophen , HYDROcodone -acetaminophen    Patient Profile   Jeffrey [name] is a 48 y.o. yo male with a history of  poorly controlled HTN, R renal cell carcinoma s/p nephrectomy, CKD IIIa, DM2, OSA, gout, morbid obesity and systolic HF due to NICM. 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 Down to 4.6 at admission.  Has had 8 units PRBCs. Hgb improved to 8.6 today. - Stool black, heme+.  - INR 3.8 on admission - Small bowel enteroscopy 9/29 showed 3 AVMs in duodenum (treated w/ APC) + severe diffuse candida in esophagus -  Continue protonix  40 IV BID - Iron stores low. Plan for IV iron OP with ongoing blood tx's.  - Add back warfarin today with lower INR goal   2. Chronic systolic CHF - Nonischemic CM, end-stage - EF 2012; 20-25%  - Echo  8/22: EF < 20%, severe LV dilation, moderate RV dysfunction.   - RHC (8/22): well compensated hemodynamics. 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 Volume okay on exam. - MAPs 90s. Continue Bidil . Start Entresto  24/26 mg BID - SGLT2i on hold given low volume status on admit. Eventually add back. - Aware of need to get to BMI ~35 to permit transplant  BMI >40. Now back on Ozempic . Significant weight loss and steadily improving  3. VAD  - s/p HM-3 placement 07/21/21  - VAD interrogated as above - Restarting warfarin. Dosing per PharmD. INR goal lowered to 1.8-2.3. - LDH stable - DL site with evidence of trauma after dressing change over the weekend. Monitor for signs of infection. Continue cefadroxil  for suppression   4. DL site infection - Continue cefadroxil  for suppression, appreciate ID   5 Atrial fibrillation: Paroxysmal - Had severe AF with RVR 7/22 s/p DC-CV x 2 - Seen by EP. Not a candidate for ablation due to body habitus  (could consider AVN ablation and CRT, if needed)  - Had post-op AF with RVR post VAD - 10/23 S/P DC-CV with restoration of NSR. - Continue amio 200 mg daily - Maintaining SR - Warfarin as above.   6. HTN - MAPs 90s - Meds as above   7. CKD stage 3B  - Has solitary kidney.   - Baseline creatinine is 1.5-1.8.  - Scr stable - Off diuretics. Avoid hypotension   8. OSA - Continue CPAP   9. 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 Depression/anxiety - Continue Zoloft  150mg  daily - Referral to psych made OP    12. Morbid obesity - Body mass index is 44.48 kg/m. - Continue Ozempic .   13. Esophageal Candidiasis - seen on EGD, biopsied  - per GI, will need Diflucan  x 2 wks   I reviewed the LVAD parameters from today, and compared the results to the patient's prior recorded data.  No programming changes  were made.  The LVAD is functioning within specified parameters.  The patient performs LVAD self-test daily.  LVAD interrogation was negative for any significant power changes, alarms or PI events/speed drops.  LVAD equipment check completed and is in good working order.  Back-up equipment present.   LVAD education done on emergency procedures and precautions and reviewed exit site care.  Length of Stay: 5  Kienan Doublin N, PA-C 07/04/2024, 9:12 AM  VAD Team --- VAD ISSUES ONLY--- Pager [phone] (7am - 7am)  Advanced Heart Failure Team  Pager [phone] (M-F; 7a - 5p)  Please contact [facility] for night-coverage after hours (5p -7a ) and weekends on [email or web address]  Patient seen and examined with the above-signed Advanced Practice Provider and/or Housestaff. I personally reviewed laboratory data, imaging studies and relevant notes. I independently examined the patient and formulated the important aspects of the plan. I have edited the note to reflect any of my changes or salient points. I have personally discussed the plan with the patient and/or family.  Denies CP or SOB. No bleeding  HGb up to 8.6  INR 1.2  General:  NAD.  HEENT: normal  Neck: supple. JVP not elevated.  Carotids 2+ bilat; no bruits. No lymphadenopathy or thryomegaly appreciated. Cor: LVAD hum.  Lungs: Clear. Abdomen: obese soft, nontender, non-distended. No hepatosplenomegaly. No bruits or masses. Good bowel sounds. Driveline site clean. Anchor in place.  Extremities: no cyanosis, clubbing, rash. Warm no edema  Neuro: alert & oriented x 3. No focal deficits. Moves all 4 without problem   Doing well. HGb up. INR still  low.   VAD interrogated personally. Parameters stable.  On fluconazole  for oral/gastric candidiasis  Discussed warfarin dosing with PharmD personally. Home when INR 1.6 or greater  Toribio Fuel, MD  3:02 PM

## 2024-07-04 NOTE — Discharge Summary (Signed)
 Advanced Heart Failure Team  Discharge Summary   Patient ID: Jeffrey [name] MRN: [id number], DOB/AGE: 49/01/1976 49 y.o. Admit date: 06/29/2024 D/C date:     07/08/2024   Primary Discharge Diagnoses:  GI bleed Acute blood loss anemia Esophageal candidiasis  Secondary Discharge Diagnoses:  HM III VAD Chronic systolic heart failure Morbid obesity HTN CKD 3a OSA DM2  Hospital Course:   Jeffrey [name] is a 48 y.o. yo male with a history of  poorly controlled HTN, R renal cell carcinoma s/p nephrectomy, CKD IIIa, DM2, OSA, gout, morbid obesity and systolic HF due to NICM. S/p HM-3 VAD placement 07/21/21 on abx for chronic suppression of driveline infection. Has [place]-[employer] S-ICD.  He presented to VAD clinic 06/29/24 with dizziness, weakness, fatigue and dark stools. Hemoglobin was down to 4.6 and INR 3.8. He was admitted for workup/management of GI bleed. He was transfused a total of 8 u RBCs throughout admission. Once INR drifted down he underwent small bowel enteroscopy and was found to have 3 duodenal AVMs treated with APC and esophageal candidiasis. Warfarin later restarted with lower INR goal of 1.8-2.3. Multiple BP meds/GDMT held on admit and slowly added back.   Hgb 8.9 INR 1.6 today. Feels great. Eager to go home.  LVAD Interrogation HM 3: Speed: 6300 Flow: 5.3 PI: 3.1 Power: 5. VAD interrogated personally. Parameters stable.   Discharge Vitals: Blood pressure (!) 82/62, pulse 65, temperature 97.6 F (36.4 C), temperature source Oral, resp. rate 17, height 5' 7 (1.702 m), weight 127.7 kg, SpO2 100%.  General:  NAD.  HEENT: normal  Neck: supple. JVP not elevated.  Carotids 2+ bilat; no bruits. No lymphadenopathy or thryomegaly appreciated. Cor: LVAD hum.  Lungs: Clear. Abdomen: obese soft, nontender, non-distended. No hepatosplenomegaly. No bruits or masses. Good bowel sounds. Driveline site clean. Anchor in place.  Extremities: no cyanosis, clubbing, rash. Warm no edema  Neuro:  alert & oriented x 3. No focal deficits. Moves all 4 without problem    Labs: Lab Results  Component Value Date   WBC 5.3 07/08/2024   HGB 8.9 (L) 07/08/2024   HCT 28.9 (L) 07/08/2024   MCV 90.0 07/08/2024   PLT 299 07/08/2024    Recent Labs  Lab 07/08/24 0202  NA 137  K 4.1  CL 109  CO2 22  BUN 28*  CREATININE 1.55*  CALCIUM  9.2  GLUCOSE 122*   Lab Results  Component Value Date   CHOL 123 03/18/2023   HDL 46 03/18/2023   LDLCALC 53 03/18/2023   TRIG 138 03/18/2023   BNP (last 3 results) No results for input(s): BNP in the last 8760 hours.  ProBNP (last 3 results) No results for input(s): PROBNP in the last 8760 hours.   Diagnostic Studies/Procedures   No results found.  Discharge Medications   Allergies as of 07/08/2024       Reactions   Aspirin  Shortness Of Breath, Itching, Rash    Burning sensation (Patient reports he tolerates other NSAIDS)    Bee Venom Hives, Swelling   Lisinopril  Cough, Other (See Comments)   Tomato Hives   Only Fresh tomato         Medication List     STOP taking these medications    amLODipine  10 MG tablet Commonly known as: NORVASC    carvedilol  3.125 MG tablet Commonly known as: Coreg    doxazosin  4 MG tablet Commonly known as: Cardura    Entresto  97-103 MG Generic drug: sacubitril -valsartan  Replaced by: sacubitril -valsartan  24-26 MG  Jardiance  10 MG Tabs tablet Generic drug: empagliflozin        TAKE these medications    Accu-Chek Guide test strip Generic drug: glucose blood Use as directed 4 times daily.   acetaminophen  325 MG tablet Commonly known as: TYLENOL  Take 2 tablets (650 mg total) by mouth every 6 (six) hours as needed for fever.   allopurinol  100 MG tablet Commonly known as: ZYLOPRIM  Take 1 tablet (100 mg total) by mouth daily.   amiodarone  200 MG tablet Commonly known as: PACERONE  Take 1 tablet (200 mg total) by mouth daily.   B-12 1000 MCG Tabs Take 1 tablet (1,000 mcg total)  by mouth daily.   Blood Pressure Kit 1 Units by Does not apply route daily.   cefadroxil  500 MG capsule Commonly known as: DURICEF Take 1 capsule (500 mg total) by mouth 2 (two) times daily.   colchicine  0.6 MG tablet Take 1 tablet (0.6 mg total) by mouth once daily.   cyclobenzaprine  5 MG tablet Commonly known as: FLEXERIL  Take 1 tablet (5 mg total) by mouth 3 (three) times daily as needed for muscle spasms   FeroSul 325 (65 Fe) MG tablet Generic drug: ferrous sulfate  Take 1 tablet (325 mg total) by mouth daily before breakfast.   fluconazole  100 MG tablet Commonly known as: DIFLUCAN  Take 1 tablet (100 mg total) by mouth daily. Start taking on: July 09, 2024   folic acid  800 MCG tablet Commonly known as: FOLVITE  Take 0.5 tablets (400 mcg total) by mouth daily.   gabapentin  400 MG capsule Commonly known as: NEURONTIN  Take 1 capsule (400 mg total) by mouth 3 (three) times daily.   isosorbide -hydrALAZINE  20-37.5 MG tablet Commonly known as: BIDIL  Take 2 tablets by mouth 3 (three) times daily.   meclizine  25 MG tablet Commonly known as: ANTIVERT  Take 1 tablet (25 mg total) by mouth 3 (three) times daily as needed for dizziness.   ondansetron  4 MG disintegrating tablet Commonly known as: ZOFRAN -ODT Take 1 tablet (4 mg total) by mouth every 8 (eight) hours as needed for nausea or vomiting.   Ozempic  (2 MG/DOSE) 8 MG/3ML Sopn Generic drug: Semaglutide  (2 MG/DOSE) Inject 2 mg as directed once a week.   pantoprazole  40 MG tablet Commonly known as: PROTONIX  Take 1 tablet (40 mg total) by mouth once daily.   rosuvastatin  10 MG tablet Commonly known as: CRESTOR  Take 1 tablet (10 mg total) by mouth daily.   sacubitril -valsartan  24-26 MG Commonly known as: ENTRESTO  Take 1 tablet by mouth 2 (two) times daily. Replaces: Entresto  97-103 MG   sertraline  50 MG tablet Commonly known as: ZOLOFT  Take 3 tablets (150 mg total) by mouth daily. Start taking on: July 09, 2024 What changed: medication strength   spironolactone  25 MG tablet Commonly known as: ALDACTONE  Take 1 tablet (25 mg total) by mouth daily.   traMADol  HCl 100 MG Tabs Take 1 tablet (100 mg) by mouth every 6 (six) hours as needed for pain   traZODone  100 MG tablet Commonly known as: DESYREL  Take 1 tablet (100 mg total) by mouth at bedtime.   warfarin 4 MG tablet Commonly known as: COUMADIN  Take as directed. If you are unsure how to take this medication, talk to your nurse or doctor. Original instructions: Take(2) tabs = 8mg  daily  or as directed by HF clinic What changed: additional instructions        Disposition   The patient will be discharged in stable condition to home.   Follow-up Information  Oley Bascom RAMAN, NP Follow up.   Specialties: Pulmonary Disease, Endocrinology Why: please call to arrange hospital follow up appt in 7-14 days Contact information: [address] [place] [address] [phone]                   Duration of Discharge Encounter: 37 minutes. Meds reviewed personally with PharmD   Signed, Toribio Fuel  MD 07/08/2024, 12:03 PM

## 2024-07-04 NOTE — Progress Notes (Addendum)
 [facility] ROUNDING NOTE   Subjective: Enteroscopy completed yesterday and notable for Candida esophagitis (started on Diflucan ), and 3 nonbleeding duodenal AVMs all treated with APC.  Remainder of the small bowel otherwise normal.  No overt bleeding in stools back to brown/green per patient.  Tolerating p.o. intake without issue.  Transfused 1 unit yesterday in the endoscopy unit and good serologic response with repeat H/H 8.6/27.1 today.   Objective: Vital signs in last 24 hours: Temp:  [98 F (36.7 C)-98.8 F (37.1 C)] 98.3 F (36.8 C) (09/30 1631) Pulse Rate:  [60-90] 63 (09/30 1631) Resp:  [17-20] 20 (09/30 1631) BP: (100-111)/(72-96) 104/85 (09/30 1631) SpO2:  [93 %-100 %] 95 % (09/30 1631) Weight:  [128.8 kg] 128.8 kg (09/30 0548) Last BM Date : 07/03/24 General: NAD Abdomen: Driveline in place.  Abdomen otherwise soft, NT, ND    Intake/Output from previous day: 09/29 0701 - 09/30 0700 In: 561.7 [P.O.:480; Blood:81.7] Out: 800 [Urine:800] Intake/Output this shift: No intake/output data recorded.   Lab Results: Recent Labs    07/02/24 0404 07/02/24 1854 07/03/24 0223 07/04/24 0232  WBC 5.4  --  5.7 7.6  HGB 7.6* 8.5* 7.8* 8.6*  PLT 197  --  201 227  MCV 93.9  --  94.5 93.1   BMET Recent Labs    07/02/24 0404 07/03/24 0223 07/04/24 0232  NA 137 136 138  K 4.1 4.4 4.4  CL 109 108 108  CO2 24 23 22   GLUCOSE 114* 98 131*  BUN 31* 27* 22*  CREATININE 1.71* 1.79* 1.64*  CALCIUM  8.7* 8.5* 8.6*   LFT No results for input(s): PROT, ALBUMIN , AST, ALT, ALKPHOS, BILITOT, BILIDIR, IBILI in the last 72 hours. PT/INR Recent Labs    07/03/24 0223 07/04/24 0232  INR 1.4* 1.2      Imaging/Other results: No results found.    Assessment and Plan:  1) Small bowel AVMs 2) Acute on chronic anemia Enteroscopy on 9/29 with 3 small, nonbleeding small bowel AVMs, all treated with APC.  No further clinical bleeding and good H/H  response. - Daily CBCs with blood products as needed per protocol - Repeat small bowel enteroscopy if concern for rebleeding with consideration for VCE to evaluate remainder of small bowel - Coumadin  restarted  3) Candida esophagitis - Incidentally diagnosed at the time of enteroscopy.  Biopsy results discussed with patient today - Started on fluconazole  with Pharmacy assistance in dosing given Coumadin   4) LVAD 5) Chronic anticoagulation 6) Atrial fibrillation 7) OSA - Management per primary Heart Failure service  Inpatient GI service will sign off at this time.  Please do not hesitate to contact us  with additional questions or concerns  I spent 50 minutes of time, including in depth chart review, independent review of results as outlined above, communicating results with the patient directly, face-to-face time with the patient, coordinating care, and ordering studies and medications as appropriate, and documentation.   Sandor LULLA Flatter, DO  07/04/2024, 7:16 PM [facility] Pager [phone]

## 2024-07-04 NOTE — Progress Notes (Signed)
 PHARMACY consult - VAD patient   Allergies  Allergen Reactions   Aspirin  Shortness Of Breath, Itching and Rash     Burning sensation (Patient reports he tolerates other NSAIDS)    Bee Venom Hives and Swelling   Lisinopril  Cough and Other (See Comments)   Tomato Hives    Only Fresh tomato     Patient Measurements: Height: 5' 7 (170.2 cm) Weight: 128.8 kg (284 lb) IBW/kg (Calculated) : 66.1 HEPARIN  DW (KG): 95.5  Vital Signs: Temp: 98 F (36.7 C) (09/30 0732) Temp Source: Oral (09/30 0732) BP: 111/96 (09/30 0732) Pulse Rate: 60 (09/30 0732)  Labs: Recent Labs    07/02/24 0404 07/02/24 1854 07/03/24 0223 07/04/24 0232  HGB 7.6* 8.5* 7.8* 8.6*  HCT 24.6* 27.0* 25.8* 27.1*  PLT 197  --  201 227  LABPROT 22.0*  --  18.0* 16.0*  INR 1.8*  --  1.4* 1.2  CREATININE 1.71*  --  1.79* 1.64*    Estimated Creatinine Clearance: 70.3 mL/min (A) (by C-G formula based on SCr of 1.64 mg/dL (H)).   Medical History: Past Medical History:  Diagnosis Date   Anxiety    Aspirin  allergy    Childhood asthma    Chronic systolic CHF (congestive heart failure) (HCC)    a. EF 20-25% in 2012. b. EF 45-50% in 10/2011 with nonischemic nuc - presumed NICM. c. 12/2014 Echo: Sev depressed LV fxn, sev dil LV, mild LVH, mild MR, sev dil LA, mildly reduced RV fxn.   CKD (chronic kidney disease) stage 2, GFR 60-89 ml/min    Depression    H/O vasectomy 12/2019   High cholesterol    Hypertension    Morbid obesity (HCC)    Nephrolithiasis    OSA on CPAP    Paroxysmal atrial fibrillation (HCC)    Presumed NICM    a. 04/2014 Myoview : EF 26%, glob HK, sev glob HK, ? prior infarct;  b. Never cathed 2/2 CKD.   Renal cell carcinoma (HCC)    a. s/p Rt robotic assisted partial converted to radical nephrectomy on 01/2013.   Troponin level elevated    a. 04/2014, 12/2014: felt due to CHF.   Type II diabetes mellitus (HCC)    Ventricular tachycardia (HCC)    a. appropriate ICD therapy 12/2017     Medications:  Medications Prior to Admission  Medication Sig Dispense Refill Last Dose/Taking   acetaminophen  (TYLENOL ) 325 MG tablet Take 2 tablets (650 mg total) by mouth every 6 (six) hours as needed for fever.   Past Week   allopurinol  (ZYLOPRIM ) 100 MG tablet Take 1 tablet (100 mg total) by mouth daily. 90 tablet 3 06/29/2024 Morning   amiodarone  (PACERONE ) 200 MG tablet Take 1 tablet (200 mg total) by mouth daily. 30 tablet 11 06/29/2024 Morning   amLODipine  (NORVASC ) 10 MG tablet Take 1 tablet (10 mg total) by mouth daily 30 tablet 6 06/29/2024 Morning   carvedilol  (COREG ) 3.125 MG tablet Take 1 tablet (3.125 mg total) by mouth 2 (two) times daily with a meal. 90 tablet 3 06/29/2024 Morning   cefadroxil  (DURICEF) 500 MG capsule Take 1 capsule (500 mg total) by mouth 2 (two) times daily. 180 capsule 3 06/29/2024 Morning   colchicine  0.6 MG tablet Take 1 tablet (0.6 mg total) by mouth once daily. 30 tablet 6 06/29/2024 Morning   cyanocobalamin  (VITAMIN B12) 1000 MCG tablet Take 1 tablet (1,000 mcg total) by mouth daily. 30 tablet 0 06/29/2024 Morning   cyclobenzaprine  (FLEXERIL ) 5 MG  tablet Take 1 tablet (5 mg total) by mouth 3 (three) times daily as needed for muscle spasms 15 tablet 0 Past Month   doxazosin  (CARDURA ) 4 MG tablet Take 2 tablets (8 mg total) by mouth 2 (two) times daily. 120 tablet 6 06/29/2024 Morning   empagliflozin  (JARDIANCE ) 10 MG TABS tablet Take 1 tablet (10 mg total) by mouth daily before breakfast. 30 tablet 11 06/29/2024 Morning   ferrous sulfate  (FEROSUL) 325 (65 FE) MG tablet Take 1 tablet (325 mg total) by mouth daily before breakfast. 90 tablet 1 06/29/2024 Morning   folic acid  (FOLVITE ) 800 MCG tablet Take 0.5 tablets (400 mcg total) by mouth daily. 30 tablet 2 06/29/2024 Morning   gabapentin  (NEURONTIN ) 400 MG capsule Take 1 capsule (400 mg total) by mouth 3 (three) times daily. 30 capsule 6 06/29/2024 Morning   isosorbide -hydrALAZINE  (BIDIL ) 20-37.5 MG tablet Take 2  tablets by mouth 3 (three) times daily. 180 tablet 6 06/29/2024 Morning   meclizine  (ANTIVERT ) 25 MG tablet Take 1 tablet (25 mg total) by mouth 3 (three) times daily as needed for dizziness. 90 tablet 2 06/29/2024 Morning   ondansetron  (ZOFRAN -ODT) 4 MG disintegrating tablet Take 1 tablet (4 mg total) by mouth every 8 (eight) hours as needed for nausea or vomiting. 5 tablet 0 Past Month   pantoprazole  (PROTONIX ) 40 MG tablet Take 1 tablet (40 mg total) by mouth once daily. 90 tablet 3 06/29/2024 Morning   rosuvastatin  (CRESTOR ) 10 MG tablet Take 1 tablet (10 mg total) by mouth daily. 90 tablet 3 06/29/2024 Morning   Semaglutide , 2 MG/DOSE, 8 MG/3ML SOPN Inject 2 mg as directed once a week. 3 mL 5 05/27/2024   sertraline  (ZOLOFT ) 100 MG tablet Take 1.5 tablets (150 mg total) by mouth daily. 135 tablet 3 06/29/2024 Morning   spironolactone  (ALDACTONE ) 25 MG tablet Take 1 tablet (25 mg total) by mouth daily. 60 tablet 6 06/29/2024 Morning   traMADol  HCl 100 MG TABS Take 1 tablet (100 mg) by mouth every 6 (six) hours as needed for pain 90 tablet 0 Past Week   traZODone  (DESYREL ) 100 MG tablet Take 1 tablet (100 mg total) by mouth at bedtime. 30 tablet 11 06/28/2024 Bedtime   warfarin (COUMADIN ) 4 MG tablet Take 12 mg (3 tabs) every Tuesday and 8 mg (2 tabs) all other days or as directed by HF clinic (Patient taking differently: Take 8-12 mg by mouth See admin instructions. Take 12 mg (3 tabs) by mouth every Tuesday and then take 8 mg (2 tabs) by mouth all other days or as directed by HF clinic) 90 tablet 11 06/28/2024 at  4:00 PM   Blood Pressure KIT 1 Units by Does not apply route daily. 1 kit 0    glucose blood test strip Use as directed 4 times daily. 100 each 11    sacubitril -valsartan  (ENTRESTO ) 97-103 MG Take 1 tablet by mouth 2 (two) times daily. (Patient not taking: Reported on 06/29/2024) 60 tablet 6 Not Taking    Assessment: 48 y.o. M s/p HM3 LVAD 07/21/21 presents to LVAD clinic with dizziness,  weakness, and fatigue. Hgb down to 4.6 and notes dark stools past 2-3 days. Pt on warfarin PTA. Admission INR 3.8.  Home dose: warfarin 12 mg (4 mg x 3) every Tue; 8 mg (4 mg x 2) all other days   INR down to 1.2 after being held since admission - no overt bleeding or complications noted. Last PRBC on 9/29. Hgb 8.6, plt 227, LDH 169.  S/p endoscopy with esophageal candidiasis, no bleeding - starting fluconazole .    Discussed with MD, given GIB on admission, will reduce INR goal to 1.8-2.3.   Goal of Therapy:  INR 1.8-2.3 Monitor platelets by anticoagulation protocol: Yes   Plan:  Warfarin 6 mg tonight Daily CBC, INR Will need to watch INR closely with interaction with fluconazole   Thank you for allowing pharmacy to participate in this patient's care,  Suzen Sour, PharmD, BCCCP Clinical Pharmacist  Phone: [phone] 07/04/2024 9:15 AM  Please check AMION for all [facility] Pharmacy phone numbers After 10:00 PM, call Main Pharmacy [phone]

## 2024-07-04 NOTE — Progress Notes (Signed)
 Patient reporting a headache. He states it is a 15/10 headache. Offered Tylenol . He refuses, says it doesn't work for his headaches. Norco prn already given earlier. Paged Dr. Orlando for possible one-time order for something for headache.

## 2024-07-04 NOTE — Plan of Care (Signed)
  Problem: Education: Goal: Patient will understand all VAD equipment and how it functions Outcome: Progressing Goal: Patient will be able to verbalize current INR target range and antiplatelet therapy for discharge home Outcome: Progressing   Problem: Cardiac: Goal: LVAD will function as expected and patient will experience no clinical alarms Outcome: Progressing   Problem: Education: Goal: Patient will understand all VAD equipment and how it functions Outcome: Progressing Goal: Patient will be able to verbalize current INR target range and antiplatelet therapy for discharge home Outcome: Progressing   Problem: Cardiac: Goal: LVAD will function as expected and patient will experience no clinical alarms Outcome: Progressing   Problem: Education: Goal: Knowledge of General Education information will improve Description: Including pain rating scale, medication(s)/side effects and non-pharmacologic comfort measures Outcome: Progressing   Problem: Cardiac: Goal: LVAD will function as expected and patient will experience no clinical alarms Outcome: Progressing   Problem: Education: Goal: Knowledge of General Education information will improve Description: Including pain rating scale, medication(s)/side effects and non-pharmacologic comfort measures Outcome: Progressing   Problem: Health Behavior/Discharge Planning: Goal: Ability to manage health-related needs will improve Outcome: Progressing   Problem: Clinical Measurements: Goal: Ability to maintain clinical measurements within normal limits will improve Outcome: Progressing Goal: Will remain free from infection Outcome: Progressing Goal: Diagnostic test results will improve Outcome: Progressing Goal: Respiratory complications will improve Outcome: Progressing Goal: Cardiovascular complication will be avoided Outcome: Progressing   Problem: Activity: Goal: Risk for activity intolerance will decrease Outcome:  Progressing   Problem: Nutrition: Goal: Adequate nutrition will be maintained Outcome: Progressing   Problem: Coping: Goal: Level of anxiety will decrease Outcome: Progressing   Problem: Elimination: Goal: Will not experience complications related to bowel motility Outcome: Progressing Goal: Will not experience complications related to urinary retention Outcome: Progressing   Problem: Pain Managment: Goal: General experience of comfort will improve and/or be controlled Outcome: Progressing   Problem: Safety: Goal: Ability to remain free from injury will improve Outcome: Progressing   Problem: Skin Integrity: Goal: Risk for impaired skin integrity will decrease Outcome: Progressing

## 2024-07-04 NOTE — Progress Notes (Signed)
 LVAD Coordinator Rounding Note:   Admitted 06/29/24 from VAD Clinic for suspected GIB with a critical Hgb 4.6. Pt seen in VAD Clinic for sick visit with complaints of dizziness and lightheadedness for approximately 3 days. Pt states he noticed his stool was darker but denies any signs of acute bleeding.    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 Lucas under Destination Therapy criteria due to BMI and chronic kidney disease.   Pt lying in bed this morning. No complaints.   Enteroscopy performed yesterday. 3 AVMs cauterized and a biopsy taken of candida in esophagus. Pt was started on Diflucan  yesterday and will take 100 mg daily for the next 2 weeks per GI. INR today 1.2. Hgb 8.4 this morning.   Pt had trauma to his driveline on Saturday when it was changed. See below.   Vital signs: Temp: 98  HR: 60 NSR Doppler: not documented Automatic cuff: 111/96 (103) O2 Sat: 100% on RA Wt: 276.9>281.9>284 lbs  LVAD interrogation reveals:  Speed: 6300 Flow: 4.9 Power: 5.1w PI: 3.9 Hct: 27   Alarms:  none Events: 2 today; 8 yesterday   Fixed speed:  6300 Low speed limit: 6000   Drive Line: Existing VAD dressing removed and site care performed using sterile technique. Drive line exit site cleaned with Chlora prep applicators x 2, allowed to dry, and Sorbaview dressing with Silverlon patch applied. There is visible trauma to his site. Pt tells me that his dressing was changed on Saturday. He states that during the dressing change the nurse pulled very hard on his driveline and he tells me that it really hurt. He had burning and pain with cleaning. See below. Exit site healed and incorporated, the velour is fully implanted at exit site. Slight redness, tenderness, scant brown drainage, no foul odor or rash noted. Drive line anchor re-applied. Pt denies fever or chills. Next dressing change due 07/07/24 by bedside nurse.    Labs:  LDH trend: 147>156>169  INR trend: 3.6>1.4>1.2  Hgb:  7.0>7.8>8.6  Anticoagulation Plan: -INR Goal: 2.0 - 2.5  -ASA Dose: none due to allergy   Blood Products:  9/25: 2 PRBC 9/26: 2 PRBC 9/27: 1 PRBC 9/28: 1 PRBC 9/29: 1 PRBC  Device: - [employer] single ICD -Therapies: on per BS rep on 08/06/21   Infection:   Drips:    Plan/Recommendations:  Call VAD Coordinator if any VAD equipment or drive line issues.   Lauraine Ip RN,BSN VAD Coordinator  Office: [phone]  24/7 Pager: [phone]

## 2024-07-05 ENCOUNTER — Encounter (HOSPITAL_COMMUNITY): Payer: Self-pay | Admitting: Gastroenterology

## 2024-07-05 ENCOUNTER — Ambulatory Visit: Payer: Self-pay | Admitting: Gastroenterology

## 2024-07-05 ENCOUNTER — Other Ambulatory Visit: Payer: Self-pay

## 2024-07-05 DIAGNOSIS — K2901 Acute gastritis with bleeding: Secondary | ICD-10-CM

## 2024-07-05 DIAGNOSIS — K2941 Chronic atrophic gastritis with bleeding: Secondary | ICD-10-CM | POA: Diagnosis not present

## 2024-07-05 DIAGNOSIS — Z95811 Presence of heart assist device: Secondary | ICD-10-CM | POA: Diagnosis not present

## 2024-07-05 LAB — CBC
HCT: 28.5 % — ABNORMAL LOW (ref 39.0–52.0)
Hemoglobin: 8.8 g/dL — ABNORMAL LOW (ref 13.0–17.0)
MCH: 28.7 pg (ref 26.0–34.0)
MCHC: 30.9 g/dL (ref 30.0–36.0)
MCV: 92.8 fL (ref 80.0–100.0)
Platelets: 268 K/uL (ref 150–400)
RBC: 3.07 MIL/uL — ABNORMAL LOW (ref 4.22–5.81)
RDW: 16.3 % — ABNORMAL HIGH (ref 11.5–15.5)
WBC: 4.7 K/uL (ref 4.0–10.5)
nRBC: 0 % (ref 0.0–0.2)

## 2024-07-05 LAB — BASIC METABOLIC PANEL WITH GFR
Anion gap: 5 (ref 5–15)
BUN: 20 mg/dL (ref 6–20)
CO2: 24 mmol/L (ref 22–32)
Calcium: 8.9 mg/dL (ref 8.9–10.3)
Chloride: 110 mmol/L (ref 98–111)
Creatinine, Ser: 1.44 mg/dL — ABNORMAL HIGH (ref 0.61–1.24)
GFR, Estimated: 60 mL/min — ABNORMAL LOW (ref >60)
Glucose, Bld: 106 mg/dL — ABNORMAL HIGH (ref 70–99)
Potassium: 4.5 mmol/L (ref 3.5–5.1)
Sodium: 139 mmol/L (ref 135–145)

## 2024-07-05 LAB — PROTIME-INR
INR: 1.2 (ref 0.8–1.2)
Prothrombin Time: 15.8 s — ABNORMAL HIGH (ref 11.4–15.2)

## 2024-07-05 LAB — LACTATE DEHYDROGENASE: LDH: 157 U/L (ref 98–192)

## 2024-07-05 MED ORDER — SACUBITRIL-VALSARTAN 49-51 MG PO TABS
1.0000 | ORAL_TABLET | Freq: Two times a day (BID) | ORAL | Status: DC
  Administered 2024-07-05 – 2024-07-06 (×2): 1 via ORAL
  Filled 2024-07-05 (×3): qty 1

## 2024-07-05 MED ORDER — PANTOPRAZOLE SODIUM 40 MG PO TBEC
40.0000 mg | DELAYED_RELEASE_TABLET | Freq: Two times a day (BID) | ORAL | Status: DC
  Administered 2024-07-05 – 2024-07-08 (×6): 40 mg via ORAL
  Filled 2024-07-05 (×6): qty 1

## 2024-07-05 MED ORDER — WARFARIN SODIUM 5 MG PO TABS
8.0000 mg | ORAL_TABLET | Freq: Once | ORAL | Status: AC
  Administered 2024-07-05: 8 mg via ORAL
  Filled 2024-07-05: qty 1

## 2024-07-05 NOTE — Progress Notes (Cosign Needed)
 Patient ID: Jeffrey [name], male   DOB: 21-Oct-1975, 48 y.o.   MRN: [id number]   Advanced Heart Failure VAD Team Note  PCP-Cardiologist: Toribio Fuel, MD   Chief Complaint: GIB  Subjective:    Hgb 4.6 on admission. Heme+. Transfused 8 u RBCs so far (most recent unit 09/29). Hgb 8.6 today. 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  MAP 80-100s.  INR 1.2.   Feeling well, no complaints.   LVAD INTERROGATION:  HeartMate III LVAD:   Flow 5.1 liters/min, speed 6300, power 5, PI 3.4  Objective:    Vital Signs:   Temp:  [97.9 F (36.6 C)-98.8 F (37.1 C)] 97.9 F (36.6 C) (10/01 1156) Pulse Rate:  [50-70] 63 (10/01 1156) Resp:  [17-25] 19 (10/01 1156) BP: (97-124)/(60-99) 119/60 (10/01 1156) SpO2:  [94 %-98 %] 94 % (10/01 1156) FiO2 (%):  [21 %] 21 % (10/01 0225) Weight:  [128.7 kg] 128.7 kg (10/01 0600) Last BM Date : 07/03/24 Mean arterial Pressure 90s  Intake/Output:   Intake/Output Summary (Last 24 hours) at 07/05/2024 1254 Last data filed at 07/05/2024 1100 Gross per 24 hour  Intake 476 ml  Output 1500 ml  Net -1024 ml    Physical Exam    General: Well appearing. No distress on RA Cardiac: Mechanical heart sounds with LVAD hum present.  Driveline: Dressing C/D/I. No drainage or redness. Anchor in place. Extremities: Warm and dry. No peripheral edema. Neuro: Alert and oriented x3. Affect pleasant. Moves all extremities without difficulty.  Telemetry   SR in 70s (personally reviewed)  Labs   Basic Metabolic Panel: Recent Labs  Lab 07/01/24 0203 07/02/24 0404 07/03/24 0223 07/04/24 0232 07/05/24 0933  NA 137 137 136 138 139  K 4.1 4.1 4.4 4.4 4.5  CL 105 109 108 108 110  CO2 22 24 23 22 24   GLUCOSE 106* 114* 98 131* 106*  BUN 31* 31* 27* 22* 20  CREATININE 1.82* 1.71* 1.79* 1.64* 1.44*  CALCIUM  8.8* 8.7* 8.5* 8.6* 8.9    Liver Function Tests: No results for input(s): AST, ALT,  ALKPHOS, BILITOT, PROT, ALBUMIN  in the last 168 hours. No results for input(s): LIPASE, AMYLASE in the last 168 hours. No results for input(s): AMMONIA in the last 168 hours.  CBC: Recent Labs  Lab 07/01/24 0203 07/01/24 1928 07/02/24 0404 07/02/24 1854 07/03/24 0223 07/04/24 0232 07/05/24 0933  WBC 6.7  --  5.4  --  5.7 7.6 4.7  HGB 7.3*   < > 7.6* 8.5* 7.8* 8.6* 8.8*  HCT 23.5*   < > 24.6* 27.0* 25.8* 27.1* 28.5*  MCV 95.1  --  93.9  --  94.5 93.1 92.8  PLT 184  --  197  --  201 227 268   < > = values in this interval not displayed.    INR: Recent Labs  Lab 07/01/24 0203 07/02/24 0404 07/03/24 0223 07/04/24 0232 07/05/24 0317  INR 2.9* 1.8* 1.4* 1.2 1.2   Imaging   No results found.  Medications:    Scheduled Medications:  allopurinol   100 mg Oral Daily   amiodarone   200 mg Oral Daily   cefadroxil   500 mg Oral BID   colchicine   0.6 mg Oral Daily   cyanocobalamin   1,000 mcg Oral Daily   fluconazole   100 mg Oral Daily   folic acid   500 mcg Oral Daily   gabapentin   400 mg Oral TID   isosorbide -hydrALAZINE   2 tablet  Oral TID   pantoprazole   40 mg Oral BID   rosuvastatin   10 mg Oral Daily   sacubitril -valsartan   1 tablet Oral BID   sertraline   150 mg Oral Daily   traZODone   100 mg Oral QHS   warfarin  8 mg Oral ONCE-1600   Warfarin - Pharmacist Dosing Inpatient   Does not apply q1600   Infusions:  PRN Medications: acetaminophen , HYDROcodone -acetaminophen   Patient Profile   Jeffrey [name] is a 48 y.o. yo male with a history of  poorly controlled HTN, R renal cell carcinoma s/p nephrectomy, CKD IIIa, DM2, OSA, gout, morbid obesity and systolic HF due to NICM. 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 Down to 4.6 at admission.  Has had 8 units PRBCs. Hgb improved to 8.6 today. - Stool black, heme+.  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 Plan for IV iron OP with ongoing blood tx's.  - back on warfarin   2. Chronic systolic CHF - Nonischemic CM, end-stage - EF 2012; 20-25%  - Echo  8/22: EF < 20%, severe LV dilation, moderate RV dysfunction.   - RHC (8/22): well compensated hemodynamics. 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 Volume okay on exam. - MAPs 90s. Continue Bidil . Increase Entresto  to 49/51 mg bid - SGLT2i on hold given low volume status on admit. Eventually add back. - Aware of need to get to BMI ~35 to permit transplant  BMI >40. Now back on Ozempic . Significant weight loss and steadily improving  3. VAD  - s/p HM-3 placement 07/21/21  - VAD interrogated as above - Restarting warfarin. Dosing per PharmD. INR goal lowered to 1.8-2.3. - DL site with evidence of trauma after dressing change over the weekend. Monitor for signs of infection. Continue cefadroxil  for suppression - INR 1.2, needs to get to 1.6 prior to discharge. D/w PharmD   4. DL site infection - Continue cefadroxil  for suppression, appreciate ID   5 Atrial fibrillation: Paroxysmal - Had severe AF with RVR 7/22 s/p DC-CV x 2 - Seen by EP. Not a candidate for ablation due to body habitus  (could consider AVN ablation and CRT, if needed)  - Had post-op AF with RVR post VAD - 10/23 S/P DC-CV with restoration of NSR. - Continue amio 200 mg daily - Maintaining SR - Warfarin as above.   6. HTN - MAPs 90s - Meds as above   7. CKD stage 3B  - Has solitary kidney.   - Baseline creatinine is 1.5-1.8.  - Scr stable - Off diuretics. Avoid hypotension   8. OSA - Continue CPAP   9. 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  Depression/anxiety - Continue Zoloft  150mg  daily - Referral to psych made OP   12. Morbid obesity - Body mass index is 44.45 kg/m. - Continue Ozempic .   13. Esophageal Candidiasis - seen on EGD, biopsied  - per GI, will need Diflucan  x 2 wks   I reviewed the LVAD parameters from today, and  compared the results to the patient's prior recorded data.  No programming changes were made.  The LVAD is functioning within specified parameters.  The patient performs LVAD self-test daily.  LVAD interrogation was negative for any significant power changes, alarms or PI events/speed drops.  LVAD equipment check completed and is in good working order.  Back-up equipment present.   LVAD education done on emergency procedures and precautions and reviewed exit site care.  Length of Stay: 6  [place] Lee, NP 07/05/2024, 12:54 PM  VAD Team --- VAD ISSUES ONLY--- Pager [phone] (7am - 7am)  Advanced Heart Failure Team  Pager [phone] (M-F; 7a - 5p)  Please contact [facility] for night-coverage after hours (5p -7a ) and weekends on [email or web address]  Patient seen and examined with the above-signed Advanced Practice Provider and/or Housestaff. I personally reviewed laboratory data, imaging studies and relevant notes. I independently examined the patient and formulated the important aspects of the plan. I have edited the note to reflect any of my changes or salient points. I have personally discussed the plan with the patient and/or family.  Hgb stable. No evidence of ongoing GIB.   On fluconazole  for esophageal/gastric candidiasis.   INR 1.2.   General:  NAD.  HEENT: normal  Neck: supple. JVP not elevated.  Carotids 2+ bilat; no bruits. No lymphadenopathy or thryomegaly appreciated. Cor: LVAD hum.  Lungs: Clear. Abdomen: obese soft, nontender, non-distended. No hepatosplenomegaly. No bruits or masses. Good bowel sounds. Driveline site clean. Anchor in place.  Extremities: no cyanosis, clubbing, rash.  Warm no edema  Neuro: alert & oriented x 3. No focal deficits. Moves all 4 without problem   No evidence of ongoing GIB. Continue to load warfarin. Home when INR >= 1.6. Discussed warfarin dosing with PharmD personally.  Continue fluconazole   Kalise Fickett, MD  12:27 AM

## 2024-07-05 NOTE — Progress Notes (Signed)
 LVAD Coordinator Rounding Note:   Admitted 06/29/24 from VAD Clinic for suspected GIB with a critical Hgb 4.6. Pt seen in VAD Clinic for sick visit with complaints of dizziness and lightheadedness for approximately 3 days. Pt states he noticed his stool was darker but denies any signs of acute bleeding.    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 Lucas under Destination Therapy criteria due to BMI and chronic kidney disease.   Pt lying in bed this morning. No complaints.   Enteroscopy performed. 3 AVMs cauterized and a biopsy taken of candida in esophagus. Pt was started on Diflucan  and will take 100 mg daily for the next 2 weeks per GI. INR today 1.2. CBC not drawn today. Stat order placed.   Pt had trauma to his driveline on Saturday when it was changed.    Vital signs: Temp: 98.2 HR: 65 NSR Doppler: 107 Automatic cuff: 119/62 (78) O2 Sat: 94% on RA Wt: 276.9>281.9>284>283.8 lbs  LVAD interrogation reveals:  Speed: 6300 Flow: 5 Power: 5.1w PI: 3.6 Hct: 27   Alarms:  none Events: 4 today; 7 yesterday   Fixed speed:  6300 Low speed limit: 6000   Drive Line: CDI. Drive line anchor secure. Next dressing change due 07/07/24 by bedside nurse.  Labs:  LDH trend: 147>156>169>157  INR trend: 3.6>1.4>1.2  Hgb: 7.0>7.8>8.6  Anticoagulation Plan: -INR Goal: 1.8-2.3  -ASA Dose: none due to allergy   Blood Products:  9/25: 2 PRBC 9/26: 2 PRBC 9/27: 1 PRBC 9/28: 1 PRBC 9/29: 1 PRBC  Device: - [employer] single ICD -Therapies: on per BS rep on 08/06/21   Infection:   Drips:    Plan/Recommendations:  Call VAD Coordinator if any VAD equipment or drive line issues.   Lauraine Ip RN,BSN VAD Coordinator  Office: [phone]  24/7 Pager: [phone]

## 2024-07-05 NOTE — Progress Notes (Signed)
   07/05/24 2034  BiPAP/CPAP/SIPAP  BiPAP/CPAP/SIPAP Pt Type Adult  BiPAP/CPAP/SIPAP DREAMSTATIOND  Reason BIPAP/CPAP not in use Other(comment) (patient states no assistance is needed, and will wear once ready for bed)

## 2024-07-05 NOTE — Progress Notes (Signed)
 PHARMACY consult - VAD patient   Allergies  Allergen Reactions   Aspirin  Shortness Of Breath, Itching and Rash     Burning sensation (Patient reports he tolerates other NSAIDS)    Bee Venom Hives and Swelling   Lisinopril  Cough and Other (See Comments)   Tomato Hives    Only Fresh tomato     Patient Measurements: Height: 5' 7 (170.2 cm) Weight: 128.7 kg (283 lb 12.8 oz) IBW/kg (Calculated) : 66.1 HEPARIN  DW (KG): 95.5  Vital Signs: Temp: 98.2 F (36.8 C) (10/01 0817) Temp Source: Oral (10/01 0817) BP: 119/62 (10/01 0817) Pulse Rate: 70 (10/01 0817)  Labs: Recent Labs    07/02/24 1854 07/03/24 0223 07/04/24 0232 07/05/24 0317  HGB 8.5* 7.8* 8.6*  --   HCT 27.0* 25.8* 27.1*  --   PLT  --  201 227  --   LABPROT  --  18.0* 16.0* 15.8*  INR  --  1.4* 1.2 1.2  CREATININE  --  1.79* 1.64*  --     Estimated Creatinine Clearance: 70.2 mL/min (A) (by C-G formula based on SCr of 1.64 mg/dL (H)).   Medical History: Past Medical History:  Diagnosis Date   Anxiety    Aspirin  allergy    Childhood asthma    Chronic systolic CHF (congestive heart failure) (HCC)    a. EF 20-25% in 2012. b. EF 45-50% in 10/2011 with nonischemic nuc - presumed NICM. c. 12/2014 Echo: Sev depressed LV fxn, sev dil LV, mild LVH, mild MR, sev dil LA, mildly reduced RV fxn.   CKD (chronic kidney disease) stage 2, GFR 60-89 ml/min    Depression    H/O vasectomy 12/2019   High cholesterol    Hypertension    Morbid obesity (HCC)    Nephrolithiasis    OSA on CPAP    Paroxysmal atrial fibrillation (HCC)    Presumed NICM    a. 04/2014 Myoview : EF 26%, glob HK, sev glob HK, ? prior infarct;  b. Never cathed 2/2 CKD.   Renal cell carcinoma (HCC)    a. s/p Rt robotic assisted partial converted to radical nephrectomy on 01/2013.   Troponin level elevated    a. 04/2014, 12/2014: felt due to CHF.   Type II diabetes mellitus (HCC)    Ventricular tachycardia (HCC)    a. appropriate ICD therapy 12/2017     Medications:  Medications Prior to Admission  Medication Sig Dispense Refill Last Dose/Taking   acetaminophen  (TYLENOL ) 325 MG tablet Take 2 tablets (650 mg total) by mouth every 6 (six) hours as needed for fever.   Past Week   allopurinol  (ZYLOPRIM ) 100 MG tablet Take 1 tablet (100 mg total) by mouth daily. 90 tablet 3 06/29/2024 Morning   amiodarone  (PACERONE ) 200 MG tablet Take 1 tablet (200 mg total) by mouth daily. 30 tablet 11 06/29/2024 Morning   amLODipine  (NORVASC ) 10 MG tablet Take 1 tablet (10 mg total) by mouth daily 30 tablet 6 06/29/2024 Morning   carvedilol  (COREG ) 3.125 MG tablet Take 1 tablet (3.125 mg total) by mouth 2 (two) times daily with a meal. 90 tablet 3 06/29/2024 Morning   cefadroxil  (DURICEF) 500 MG capsule Take 1 capsule (500 mg total) by mouth 2 (two) times daily. 180 capsule 3 06/29/2024 Morning   colchicine  0.6 MG tablet Take 1 tablet (0.6 mg total) by mouth once daily. 30 tablet 6 06/29/2024 Morning   cyanocobalamin  (VITAMIN B12) 1000 MCG tablet Take 1 tablet (1,000 mcg total) by mouth daily. 30  tablet 0 06/29/2024 Morning   cyclobenzaprine  (FLEXERIL ) 5 MG tablet Take 1 tablet (5 mg total) by mouth 3 (three) times daily as needed for muscle spasms 15 tablet 0 Past Month   doxazosin  (CARDURA ) 4 MG tablet Take 2 tablets (8 mg total) by mouth 2 (two) times daily. 120 tablet 6 06/29/2024 Morning   empagliflozin  (JARDIANCE ) 10 MG TABS tablet Take 1 tablet (10 mg total) by mouth daily before breakfast. 30 tablet 11 06/29/2024 Morning   ferrous sulfate  (FEROSUL) 325 (65 FE) MG tablet Take 1 tablet (325 mg total) by mouth daily before breakfast. 90 tablet 1 06/29/2024 Morning   folic acid  (FOLVITE ) 800 MCG tablet Take 0.5 tablets (400 mcg total) by mouth daily. 30 tablet 2 06/29/2024 Morning   gabapentin  (NEURONTIN ) 400 MG capsule Take 1 capsule (400 mg total) by mouth 3 (three) times daily. 30 capsule 6 06/29/2024 Morning   isosorbide -hydrALAZINE  (BIDIL ) 20-37.5 MG tablet Take 2  tablets by mouth 3 (three) times daily. 180 tablet 6 06/29/2024 Morning   meclizine  (ANTIVERT ) 25 MG tablet Take 1 tablet (25 mg total) by mouth 3 (three) times daily as needed for dizziness. 90 tablet 2 06/29/2024 Morning   ondansetron  (ZOFRAN -ODT) 4 MG disintegrating tablet Take 1 tablet (4 mg total) by mouth every 8 (eight) hours as needed for nausea or vomiting. 5 tablet 0 Past Month   pantoprazole  (PROTONIX ) 40 MG tablet Take 1 tablet (40 mg total) by mouth once daily. 90 tablet 3 06/29/2024 Morning   rosuvastatin  (CRESTOR ) 10 MG tablet Take 1 tablet (10 mg total) by mouth daily. 90 tablet 3 06/29/2024 Morning   Semaglutide , 2 MG/DOSE, 8 MG/3ML SOPN Inject 2 mg as directed once a week. 3 mL 5 05/27/2024   sertraline  (ZOLOFT ) 100 MG tablet Take 1.5 tablets (150 mg total) by mouth daily. 135 tablet 3 06/29/2024 Morning   spironolactone  (ALDACTONE ) 25 MG tablet Take 1 tablet (25 mg total) by mouth daily. 60 tablet 6 06/29/2024 Morning   traMADol  HCl 100 MG TABS Take 1 tablet (100 mg) by mouth every 6 (six) hours as needed for pain 90 tablet 0 Past Week   traZODone  (DESYREL ) 100 MG tablet Take 1 tablet (100 mg total) by mouth at bedtime. 30 tablet 11 06/28/2024 Bedtime   warfarin (COUMADIN ) 4 MG tablet Take 12 mg (3 tabs) every Tuesday and 8 mg (2 tabs) all other days or as directed by HF clinic (Patient taking differently: Take 8-12 mg by mouth See admin instructions. Take 12 mg (3 tabs) by mouth every Tuesday and then take 8 mg (2 tabs) by mouth all other days or as directed by HF clinic) 90 tablet 11 06/28/2024 at  4:00 PM   Blood Pressure KIT 1 Units by Does not apply route daily. 1 kit 0    glucose blood test strip Use as directed 4 times daily. 100 each 11    sacubitril -valsartan  (ENTRESTO ) 97-103 MG Take 1 tablet by mouth 2 (two) times daily. (Patient not taking: Reported on 06/29/2024) 60 tablet 6 Not Taking    Assessment: 48 y.o. M s/p HM3 LVAD 07/21/21 presents to LVAD clinic with dizziness,  weakness, and fatigue. Hgb down to 4.6 and notes dark stools past 2-3 days. Pt on warfarin PTA. Admission INR 3.8.  Home dose: warfarin 12 mg (4 mg x 3) every Tue; 8 mg (4 mg x 2) all other days   INR remains subtherapeutic at 1.2, restarted on 9/30 - no overt bleeding or complications noted. Hgb 8.8,  plt 268 - stable. LDH today stable at 157.   S/p endoscopy with esophageal candidiasis, no bleeding - starting fluconazole .    Discussed with MD, given GIB on admission, will reduce INR goal to 1.8-2.3.   Goal of Therapy:  INR 1.8-2.3 Monitor platelets by anticoagulation protocol: Yes   Plan:  Warfarin 8 mg tonight Daily CBC, INR Will need to watch INR closely with interaction with fluconazole   Thank you for allowing pharmacy to participate in this patient's care,  Suzen Sour, PharmD, BCCCP Clinical Pharmacist  Phone: [phone] 07/05/2024 9:50 AM  Please check AMION for all [facility] Pharmacy phone numbers After 10:00 PM, call Main Pharmacy [phone]

## 2024-07-05 NOTE — Plan of Care (Signed)
  Problem: Cardiac: Goal: LVAD will function as expected and patient will experience no clinical alarms Outcome: Progressing   Problem: Education: Goal: Knowledge of General Education information will improve Description: Including pain rating scale, medication(s)/side effects and non-pharmacologic comfort measures Outcome: Progressing   Problem: Activity: Goal: Risk for activity intolerance will decrease Outcome: Progressing   Problem: Nutrition: Goal: Adequate nutrition will be maintained Outcome: Progressing   Problem: Pain Managment: Goal: General experience of comfort will improve and/or be controlled Outcome: Progressing   Problem: Cardiac: Goal: LVAD will function as expected and patient will experience no clinical alarms Outcome: Progressing

## 2024-07-05 NOTE — Progress Notes (Signed)
 Patient had a 10 beat run of V-tach at 0402, sleeping and asymptomatic during this. Paged Dr. Orlando at [phone] to update.

## 2024-07-06 DIAGNOSIS — K5520 Angiodysplasia of colon without hemorrhage: Secondary | ICD-10-CM | POA: Diagnosis not present

## 2024-07-06 DIAGNOSIS — Z95811 Presence of heart assist device: Secondary | ICD-10-CM | POA: Diagnosis not present

## 2024-07-06 DIAGNOSIS — K254 Chronic or unspecified gastric ulcer with hemorrhage: Secondary | ICD-10-CM | POA: Diagnosis not present

## 2024-07-06 DIAGNOSIS — K922 Gastrointestinal hemorrhage, unspecified: Secondary | ICD-10-CM | POA: Diagnosis not present

## 2024-07-06 DIAGNOSIS — B3781 Candidal esophagitis: Secondary | ICD-10-CM

## 2024-07-06 LAB — BASIC METABOLIC PANEL WITH GFR
Anion gap: 5 (ref 5–15)
BUN: 22 mg/dL — ABNORMAL HIGH (ref 6–20)
CO2: 22 mmol/L (ref 22–32)
Calcium: 8.8 mg/dL — ABNORMAL LOW (ref 8.9–10.3)
Chloride: 111 mmol/L (ref 98–111)
Creatinine, Ser: 1.58 mg/dL — ABNORMAL HIGH (ref 0.61–1.24)
GFR, Estimated: 53 mL/min — ABNORMAL LOW (ref >60)
Glucose, Bld: 106 mg/dL — ABNORMAL HIGH (ref 70–99)
Potassium: 4.3 mmol/L (ref 3.5–5.1)
Sodium: 138 mmol/L (ref 135–145)

## 2024-07-06 LAB — CBC
HCT: 31.2 % — ABNORMAL LOW (ref 39.0–52.0)
Hemoglobin: 9.6 g/dL — ABNORMAL LOW (ref 13.0–17.0)
MCH: 28.4 pg (ref 26.0–34.0)
MCHC: 30.8 g/dL (ref 30.0–36.0)
MCV: 92.3 fL (ref 80.0–100.0)
Platelets: 290 K/uL (ref 150–400)
RBC: 3.38 MIL/uL — ABNORMAL LOW (ref 4.22–5.81)
RDW: 16 % — ABNORMAL HIGH (ref 11.5–15.5)
WBC: 5 K/uL (ref 4.0–10.5)
nRBC: 0 % (ref 0.0–0.2)

## 2024-07-06 LAB — PROTIME-INR
INR: 1.2 (ref 0.8–1.2)
Prothrombin Time: 15.5 s — ABNORMAL HIGH (ref 11.4–15.2)

## 2024-07-06 LAB — LACTATE DEHYDROGENASE: LDH: 150 U/L (ref 98–192)

## 2024-07-06 MED ORDER — SACUBITRIL-VALSARTAN 24-26 MG PO TABS
1.0000 | ORAL_TABLET | Freq: Two times a day (BID) | ORAL | Status: DC
  Administered 2024-07-06 – 2024-07-08 (×4): 1 via ORAL
  Filled 2024-07-06 (×4): qty 1

## 2024-07-06 MED ORDER — WARFARIN SODIUM 5 MG PO TABS
12.0000 mg | ORAL_TABLET | Freq: Once | ORAL | Status: AC
  Administered 2024-07-06: 12 mg via ORAL
  Filled 2024-07-06: qty 1

## 2024-07-06 NOTE — Plan of Care (Signed)
  Problem: Education: Goal: Patient will understand all VAD equipment and how it functions Outcome: Progressing   Problem: Cardiac: Goal: LVAD will function as expected and patient will experience no clinical alarms Outcome: Progressing   Problem: Education: Goal: Knowledge of General Education information will improve Description: Including pain rating scale, medication(s)/side effects and non-pharmacologic comfort measures Outcome: Progressing   Problem: Clinical Measurements: Goal: Ability to maintain clinical measurements within normal limits will improve Outcome: Progressing Goal: Diagnostic test results will improve Outcome: Progressing Goal: Cardiovascular complication will be avoided Outcome: Progressing   Problem: Activity: Goal: Risk for activity intolerance will decrease Outcome: Progressing

## 2024-07-06 NOTE — Progress Notes (Signed)
 PHARMACY consult - VAD patient   Allergies  Allergen Reactions   Aspirin  Shortness Of Breath, Itching and Rash     Burning sensation (Patient reports he tolerates other NSAIDS)    Bee Venom Hives and Swelling   Lisinopril  Cough and Other (See Comments)   Tomato Hives    Only Fresh tomato     Patient Measurements: Height: 5' 7 (170.2 cm) Weight: 128 kg (282 lb 1.6 oz) IBW/kg (Calculated) : 66.1 HEPARIN  DW (KG): 95.5  Vital Signs: Temp: 97.7 F (36.5 C) (10/02 1115) Temp Source: Oral (10/02 1115) BP: 128/95 (10/02 1115) Pulse Rate: 65 (10/02 1115)  Labs: Recent Labs    07/04/24 0232 07/05/24 0317 07/05/24 0933 07/06/24 0309  HGB 8.6*  --  8.8* 9.6*  HCT 27.1*  --  28.5* 31.2*  PLT 227  --  268 290  LABPROT 16.0* 15.8*  --  15.5*  INR 1.2 1.2  --  1.2  CREATININE 1.64*  --  1.44* 1.58*    Estimated Creatinine Clearance: 72.7 mL/min (A) (by C-G formula based on SCr of 1.58 mg/dL (H)).   Medical History: Past Medical History:  Diagnosis Date   Anxiety    Aspirin  allergy    Childhood asthma    Chronic systolic CHF (congestive heart failure) (HCC)    a. EF 20-25% in 2012. b. EF 45-50% in 10/2011 with nonischemic nuc - presumed NICM. c. 12/2014 Echo: Sev depressed LV fxn, sev dil LV, mild LVH, mild MR, sev dil LA, mildly reduced RV fxn.   CKD (chronic kidney disease) stage 2, GFR 60-89 ml/min    Depression    H/O vasectomy 12/2019   High cholesterol    Hypertension    Morbid obesity (HCC)    Nephrolithiasis    OSA on CPAP    Paroxysmal atrial fibrillation (HCC)    Presumed NICM    a. 04/2014 Myoview : EF 26%, glob HK, sev glob HK, ? prior infarct;  b. Never cathed 2/2 CKD.   Renal cell carcinoma (HCC)    a. s/p Rt robotic assisted partial converted to radical nephrectomy on 01/2013.   Troponin level elevated    a. 04/2014, 12/2014: felt due to CHF.   Type II diabetes mellitus (HCC)    Ventricular tachycardia (HCC)    a. appropriate ICD therapy 12/2017     Medications:  Medications Prior to Admission  Medication Sig Dispense Refill Last Dose/Taking   acetaminophen  (TYLENOL ) 325 MG tablet Take 2 tablets (650 mg total) by mouth every 6 (six) hours as needed for fever.   Past Week   allopurinol  (ZYLOPRIM ) 100 MG tablet Take 1 tablet (100 mg total) by mouth daily. 90 tablet 3 06/29/2024 Morning   amiodarone  (PACERONE ) 200 MG tablet Take 1 tablet (200 mg total) by mouth daily. 30 tablet 11 06/29/2024 Morning   amLODipine  (NORVASC ) 10 MG tablet Take 1 tablet (10 mg total) by mouth daily 30 tablet 6 06/29/2024 Morning   carvedilol  (COREG ) 3.125 MG tablet Take 1 tablet (3.125 mg total) by mouth 2 (two) times daily with a meal. 90 tablet 3 06/29/2024 Morning   cefadroxil  (DURICEF) 500 MG capsule Take 1 capsule (500 mg total) by mouth 2 (two) times daily. 180 capsule 3 06/29/2024 Morning   colchicine  0.6 MG tablet Take 1 tablet (0.6 mg total) by mouth once daily. 30 tablet 6 06/29/2024 Morning   cyanocobalamin  (VITAMIN B12) 1000 MCG tablet Take 1 tablet (1,000 mcg total) by mouth daily. 30 tablet 0 06/29/2024 Morning  cyclobenzaprine  (FLEXERIL ) 5 MG tablet Take 1 tablet (5 mg total) by mouth 3 (three) times daily as needed for muscle spasms 15 tablet 0 Past Month   doxazosin  (CARDURA ) 4 MG tablet Take 2 tablets (8 mg total) by mouth 2 (two) times daily. 120 tablet 6 06/29/2024 Morning   empagliflozin  (JARDIANCE ) 10 MG TABS tablet Take 1 tablet (10 mg total) by mouth daily before breakfast. 30 tablet 11 06/29/2024 Morning   ferrous sulfate  (FEROSUL) 325 (65 FE) MG tablet Take 1 tablet (325 mg total) by mouth daily before breakfast. 90 tablet 1 06/29/2024 Morning   folic acid  (FOLVITE ) 800 MCG tablet Take 0.5 tablets (400 mcg total) by mouth daily. 30 tablet 2 06/29/2024 Morning   gabapentin  (NEURONTIN ) 400 MG capsule Take 1 capsule (400 mg total) by mouth 3 (three) times daily. 30 capsule 6 06/29/2024 Morning   isosorbide -hydrALAZINE  (BIDIL ) 20-37.5 MG tablet Take 2  tablets by mouth 3 (three) times daily. 180 tablet 6 06/29/2024 Morning   meclizine  (ANTIVERT ) 25 MG tablet Take 1 tablet (25 mg total) by mouth 3 (three) times daily as needed for dizziness. 90 tablet 2 06/29/2024 Morning   ondansetron  (ZOFRAN -ODT) 4 MG disintegrating tablet Take 1 tablet (4 mg total) by mouth every 8 (eight) hours as needed for nausea or vomiting. 5 tablet 0 Past Month   pantoprazole  (PROTONIX ) 40 MG tablet Take 1 tablet (40 mg total) by mouth once daily. 90 tablet 3 06/29/2024 Morning   rosuvastatin  (CRESTOR ) 10 MG tablet Take 1 tablet (10 mg total) by mouth daily. 90 tablet 3 06/29/2024 Morning   Semaglutide , 2 MG/DOSE, 8 MG/3ML SOPN Inject 2 mg as directed once a week. 3 mL 5 05/27/2024   sertraline  (ZOLOFT ) 100 MG tablet Take 1.5 tablets (150 mg total) by mouth daily. 135 tablet 3 06/29/2024 Morning   spironolactone  (ALDACTONE ) 25 MG tablet Take 1 tablet (25 mg total) by mouth daily. 60 tablet 6 06/29/2024 Morning   traMADol  HCl 100 MG TABS Take 1 tablet (100 mg) by mouth every 6 (six) hours as needed for pain 90 tablet 0 Past Week   traZODone  (DESYREL ) 100 MG tablet Take 1 tablet (100 mg total) by mouth at bedtime. 30 tablet 11 06/28/2024 Bedtime   warfarin (COUMADIN ) 4 MG tablet Take 12 mg (3 tabs) every Tuesday and 8 mg (2 tabs) all other days or as directed by HF clinic (Patient taking differently: Take 8-12 mg by mouth See admin instructions. Take 12 mg (3 tabs) by mouth every Tuesday and then take 8 mg (2 tabs) by mouth all other days or as directed by HF clinic) 90 tablet 11 06/28/2024 at  4:00 PM   Blood Pressure KIT 1 Units by Does not apply route daily. 1 kit 0    glucose blood test strip Use as directed 4 times daily. 100 each 11    sacubitril -valsartan  (ENTRESTO ) 97-103 MG Take 1 tablet by mouth 2 (two) times daily. (Patient not taking: Reported on 06/29/2024) 60 tablet 6 Not Taking    Assessment: 48 y.o. M s/p HM3 LVAD 07/21/21 presents to LVAD clinic with dizziness,  weakness, and fatigue. Hgb down to 4.6 and notes dark stools past 2-3 days. Pt on warfarin PTA. Admission INR 3.8.  Home dose: warfarin 12 mg (4 mg x 3) every Tue; 8 mg (4 mg x 2) all other days   INR remains subtherapeutic at 1.2, restarted on 9/30 - no overt bleeding or complications noted. Hgb 9.6, plt 290 - stable. LDH today  stable at 150.   S/p endoscopy with esophageal candidiasis, no bleeding - starting fluconazole .    Discussed with MD, given GIB on admission, will reduce INR goal to 1.8-2.3.   Goal of Therapy:  INR 1.8-2.3 Monitor platelets by anticoagulation protocol: Yes   Plan:  Warfarin 12 mg tonight (normally takes Tuesday once weekly but will give today given low INR) Daily CBC, INR Will need to watch INR closely with interaction with fluconazole   Thank you for allowing pharmacy to participate in this patient's care,  Suzen Sour, PharmD, BCCCP Clinical Pharmacist  Phone: [phone] 07/06/2024 11:38 AM  Please check AMION for all [facility] Pharmacy phone numbers After 10:00 PM, call Main Pharmacy [phone]

## 2024-07-06 NOTE — Progress Notes (Signed)
 Patient ID: Jeffrey [name], male   DOB: [birth year], 48 y.o.   MRN: [id number]   Advanced Heart Failure VAD Team Note  PCP-Cardiologist: Toribio Fuel, MD  Chief Complaint: GIB Subjective:    Doppler pressure better at 78 today, however his PI has come down this morning and reports some dizziness with standing, although steady on his feet and not lightheaded.   INR 1.2.   LVAD INTERROGATION:  HeartMate III LVAD:   Flow 5.1 liters/min, speed 6300, power 5, PI 3.4  Objective:    Vital Signs:   Temp:  [97.7 F (36.5 C)-98.7 F (37.1 C)] 97.7 F (36.5 C) (10/02 1115) Pulse Rate:  [60-83] 65 (10/02 1115) Resp:  [17-20] 20 (10/02 1115) BP: (90-128)/(76-100) 128/95 (10/02 1115) SpO2:  [94 %-100 %] 94 % (10/02 1115) Weight:  [871 kg] 128 kg (10/02 0615) Last BM Date : 07/03/24 Mean arterial Pressure 90s  Intake/Output:   Intake/Output Summary (Last 24 hours) at 07/06/2024 1248 Last data filed at 07/06/2024 0800 Gross per 24 hour  Intake 480 ml  Output 650 ml  Net -170 ml    Physical Exam    General: Well appearing. No distress on RA Cardiac: JVP flat. Mechanical heart sounds with LVAD hum present.  Resp: Lung sounds clear and equal B/L Abdomen: Soft, non-tender, non-distended.  Driveline: Dressing C/D/I. No drainage or redness. Anchor in place. Extremities: Warm and dry. no edema. Neuro: Alert and oriented x3. Affect pleasant. Moves all extremities without difficulty.  Labs   Basic Metabolic Panel: Recent Labs  Lab 07/02/24 0404 07/03/24 0223 07/04/24 0232 07/05/24 0933 07/06/24 0309  NA 137 136 138 139 138  K 4.1 4.4 4.4 4.5 4.3  CL 109 108 108 110 111  CO2 24 23 22 24 22   GLUCOSE 114* 98 131* 106* 106*  BUN 31* 27* 22* 20 22*  CREATININE 1.71* 1.79* 1.64* 1.44* 1.58*  CALCIUM  8.7* 8.5* 8.6* 8.9 8.8*   CBC: Recent Labs  Lab 07/02/24 0404 07/02/24 1854 07/03/24 0223 07/04/24 0232 07/05/24 0933 07/06/24 0309  WBC 5.4  --  5.7 7.6 4.7 5.0  HGB 7.6*  8.5* 7.8* 8.6* 8.8* 9.6*  HCT 24.6* 27.0* 25.8* 27.1* 28.5* 31.2*  MCV 93.9  --  94.5 93.1 92.8 92.3  PLT 197  --  201 227 268 290   INR: Recent Labs  Lab 07/02/24 0404 07/03/24 0223 07/04/24 0232 07/05/24 0317 07/06/24 0309  INR 1.8* 1.4* 1.2 1.2 1.2   Imaging   No results found.  Medications:    Scheduled Medications:  allopurinol   100 mg Oral Daily   amiodarone   200 mg Oral Daily   cefadroxil   500 mg Oral BID   colchicine   0.6 mg Oral Daily   cyanocobalamin   1,000 mcg Oral Daily   fluconazole   100 mg Oral Daily   folic acid   500 mcg Oral Daily   gabapentin   400 mg Oral TID   isosorbide -hydrALAZINE   2 tablet Oral TID   pantoprazole   40 mg Oral BID   rosuvastatin   10 mg Oral Daily   sacubitril -valsartan   1 tablet Oral BID   sertraline   150 mg Oral Daily   traZODone   100 mg Oral QHS   warfarin  12 mg Oral ONCE-1600   Warfarin - Pharmacist Dosing Inpatient   Does not apply q1600   Infusions:  PRN Medications: acetaminophen , HYDROcodone -acetaminophen   Patient Profile   Jeffrey [name] is a 48 y.o. yo male with a history of  poorly controlled  HTN, R renal cell carcinoma s/p nephrectomy, CKD IIIa, DM2, OSA, gout, morbid obesity and systolic HF due to NICM. 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 Down to 4.6 at admission.  Has had 8 units PRBCs. Hgb improved to 8.6 today. - Stool black, heme+.  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 Plan for IV iron OP with ongoing blood tx's.  - on warfarin   2. Chronic systolic CHF - Nonischemic CM, end-stage - EF 2012; 20-25%  - Echo  8/22: EF < 20%, severe LV dilation, moderate RV dysfunction.   - RHC (8/22): well compensated hemodynamics. 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 Volume okay on exam. - MAPs in 70s. Continue Bidil  2 tab tid - will lower entresto  back to 24-26 mg bid with orthostasis - SGLT2i on hold given low volume status on admit. Eventually add back. - Aware of need to get to BMI ~35 to permit transplant  BMI >40. Now back on Ozempic . Significant weight loss and steadily improving  3. VAD  - s/p HM-3 placement 07/21/21  - VAD interrogated as above - Restarting warfarin. Dosing per PharmD. INR goal lowered to 1.8-2.3. - DL site with evidence of trauma after dressing change over the weekend. Monitor for signs of infection. Continue cefadroxil  for suppression - INR 1.2 again..., needs to get to 1.6 prior to discharge. D/w PharmD   4. DL site infection - Continue cefadroxil  for suppression, appreciate ID   5 Atrial fibrillation: Paroxysmal - Had severe AF with RVR 7/22 s/p DC-CV x 2 - Seen by EP. Not a candidate for ablation due to body habitus  (could consider AVN ablation and CRT, if needed)  - Had post-op AF with RVR post VAD - 10/23 S/P DC-CV with restoration of NSR. - Continue amio 200 mg daily - Maintaining SR - Warfarin as above.   6. HTN - MAPs 70-80s today - Meds as above   7. CKD stage 3B  - Has solitary kidney.   - Baseline creatinine is 1.5-1.8.  - Scr stable - Off diuretics. Avoid hypotension   8. OSA - Continue CPAP   9. 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 Depression/anxiety - Continue Zoloft  150mg  daily - Referral to psych made OP   12. Morbid obesity - Body mass index is 44.18 kg/m. - Continue Ozempic .   13. Esophageal Candidiasis - seen on EGD, biopsied  - per GI, will need Diflucan  x 2 wks   I reviewed the LVAD parameters from today, and compared the results to the patient's prior recorded data.  No programming changes were made.  The LVAD is  functioning within specified parameters.  The patient performs LVAD self-test daily.  LVAD interrogation was negative for any significant power changes, alarms or PI events/speed drops.  LVAD equipment check completed and is in good working order.  Back-up equipment present.   LVAD education done on emergency procedures and precautions and reviewed exit site care.  Length of Stay: 7  [place] Lee, NP 07/06/2024,  12:48 PM  VAD Team --- VAD ISSUES ONLY--- Pager [phone] (7am - 7am)  Advanced Heart Failure Team  Pager [phone] (M-F; 7a - 5p)  Please contact [facility] for night-coverage after hours (5p -7a ) and weekends on [email or web address]  Patient seen and examined with the above-signed Advanced Practice Provider and/or Housestaff. I personally reviewed laboratory data, imaging studies and relevant notes. I independently examined the patient and formulated the important aspects of the plan. I have edited the note to reflect any of my changes or salient points. I have personally discussed the plan with the patient and/or family.  Feeling a bit lightheaded after BP meds adjusted. PI running low.   No further bleeding. INR 1.2   General:  NAD.  HEENT: normal  Neck: supple. JVP not elevated.  Carotids 2+ bilat; no bruits. No lymphadenopathy or thryomegaly appreciated. Cor: LVAD hum.  Lungs: Clear. Abdomen: obese soft, nontender, non-distended. No hepatosplenomegaly. No bruits or masses. Good bowel sounds. Driveline site clean. Anchor in place.  Extremities: no cyanosis, clubbing, rash. Warm no edema  Neuro: alert & oriented x 3. No focal deficits. Moves all 4 without problem   Encourage po intake. INR remains subtherapeutic. Discussed warfarin dosing with PharmD personally. Will give 12mg  today  VAD interrogated personally. Parameters stable.   Continue Diflucan  for candidiasis  Toribio Fuel, MD  3:03 PM

## 2024-07-06 NOTE — Plan of Care (Signed)
  Problem: Education: Goal: Patient will understand all VAD equipment and how it functions Outcome: Progressing Goal: Patient will be able to verbalize current INR target range and antiplatelet therapy for discharge home Outcome: Progressing   Problem: Cardiac: Goal: LVAD will function as expected and patient will experience no clinical alarms Outcome: Progressing   Problem: Education: Goal: Patient will understand all VAD equipment and how it functions Outcome: Progressing Goal: Patient will be able to verbalize current INR target range and antiplatelet therapy for discharge home Outcome: Progressing   Problem: Cardiac: Goal: LVAD will function as expected and patient will experience no clinical alarms Outcome: Progressing   Problem: Education: Goal: Knowledge of General Education information will improve Description: Including pain rating scale, medication(s)/side effects and non-pharmacologic comfort measures Outcome: Progressing   Problem: Health Behavior/Discharge Planning: Goal: Ability to manage health-related needs will improve Outcome: Progressing   Problem: Clinical Measurements: Goal: Ability to maintain clinical measurements within normal limits will improve Outcome: Progressing Goal: Will remain free from infection Outcome: Progressing Goal: Diagnostic test results will improve Outcome: Progressing Goal: Respiratory complications will improve Outcome: Progressing Goal: Cardiovascular complication will be avoided Outcome: Progressing   Problem: Nutrition: Goal: Adequate nutrition will be maintained Outcome: Progressing   Problem: Elimination: Goal: Will not experience complications related to bowel motility Outcome: Progressing Goal: Will not experience complications related to urinary retention Outcome: Progressing   Problem: Safety: Goal: Ability to remain free from injury will improve Outcome: Progressing   Problem: Skin Integrity: Goal: Risk for  impaired skin integrity will decrease Outcome: Progressing

## 2024-07-06 NOTE — Progress Notes (Signed)
 LVAD Coordinator Rounding Note:   Admitted 06/29/24 from VAD Clinic for suspected GIB with a critical Hgb 4.6. Pt seen in VAD Clinic for sick visit with complaints of dizziness and lightheadedness for approximately 3 days. Pt states he noticed his stool was darker but denies any signs of acute bleeding.    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 Lucas under Destination Therapy criteria due to BMI and chronic kidney disease.   Pt lying in bed this morning. No complaints.   Enteroscopy performed. 3 AVMs cauterized and a biopsy taken of candida in esophagus. Pt was started on Diflucan  and will take 100 mg daily for the next 2 weeks per GI. INR today 1.2. CBC not drawn today. Stat order placed.   Pt had trauma to his driveline on Saturday when it was changed.    Vital signs: Temp: 97.7 HR: 61 NSR Doppler: 107 Automatic cuff: 128/95 (107) O2 Sat: 94% on RA Wt: 276.9>281.9>284>283.8 lbs  LVAD interrogation reveals:  Speed: 6300 Flow: 5.2 Power: 5.2w PI: 3.4 Hct: 31   Alarms:  none Events: 7 today; 10 yesterday   Fixed speed:  6300 Low speed limit: 6000   Drive Line: CDI. Drive line anchor secure. Next dressing change due 07/07/24 by bedside nurse.  Labs:  LDH trend: 147>156>169>157>150  INR trend: 3.6>1.4>1.2  Hgb: 7.0>7.8>8.6>9.6  Anticoagulation Plan: -INR Goal: 1.8-2.3  -ASA Dose: none due to allergy   Blood Products:  9/25: 2 PRBC 9/26: 2 PRBC 9/27: 1 PRBC 9/28: 1 PRBC 9/29: 1 PRBC  Device: - [employer] single ICD -Therapies: on per BS rep on 08/06/21   Infection:   Drips:    Plan/Recommendations:  Call VAD Coordinator if any VAD equipment or drive line issues.   Lauraine Ip RN,BSN VAD Coordinator  Office: [phone]  24/7 Pager: [phone]

## 2024-07-07 ENCOUNTER — Other Ambulatory Visit: Payer: Self-pay

## 2024-07-07 DIAGNOSIS — K922 Gastrointestinal hemorrhage, unspecified: Secondary | ICD-10-CM | POA: Diagnosis not present

## 2024-07-07 DIAGNOSIS — Z95811 Presence of heart assist device: Secondary | ICD-10-CM | POA: Diagnosis not present

## 2024-07-07 DIAGNOSIS — B3781 Candidal esophagitis: Secondary | ICD-10-CM | POA: Diagnosis not present

## 2024-07-07 DIAGNOSIS — K5520 Angiodysplasia of colon without hemorrhage: Secondary | ICD-10-CM | POA: Diagnosis not present

## 2024-07-07 LAB — BASIC METABOLIC PANEL WITH GFR
Anion gap: 7 (ref 5–15)
BUN: 23 mg/dL — ABNORMAL HIGH (ref 6–20)
CO2: 21 mmol/L — ABNORMAL LOW (ref 22–32)
Calcium: 9.2 mg/dL (ref 8.9–10.3)
Chloride: 109 mmol/L (ref 98–111)
Creatinine, Ser: 1.55 mg/dL — ABNORMAL HIGH (ref 0.61–1.24)
GFR, Estimated: 55 mL/min — ABNORMAL LOW (ref >60)
Glucose, Bld: 93 mg/dL (ref 70–99)
Potassium: 4.6 mmol/L (ref 3.5–5.1)
Sodium: 137 mmol/L (ref 135–145)

## 2024-07-07 LAB — LACTATE DEHYDROGENASE: LDH: 173 U/L (ref 98–192)

## 2024-07-07 LAB — PROTIME-INR
INR: 1.3 — ABNORMAL HIGH (ref 0.8–1.2)
Prothrombin Time: 16.6 s — ABNORMAL HIGH (ref 11.4–15.2)

## 2024-07-07 MED ORDER — WARFARIN SODIUM 5 MG PO TABS
12.0000 mg | ORAL_TABLET | Freq: Once | ORAL | Status: AC
  Administered 2024-07-07: 12 mg via ORAL
  Filled 2024-07-07: qty 1

## 2024-07-07 MED ORDER — MECLIZINE HCL 25 MG PO TABS
25.0000 mg | ORAL_TABLET | Freq: Three times a day (TID) | ORAL | Status: DC | PRN
  Administered 2024-07-07: 25 mg via ORAL
  Filled 2024-07-07 (×2): qty 1

## 2024-07-07 NOTE — Progress Notes (Signed)
 PHARMACY Consult - VAD patient   Allergies  Allergen Reactions   Aspirin  Shortness Of Breath, Itching and Rash     Burning sensation (Patient reports he tolerates other NSAIDS)    Bee Venom Hives and Swelling   Lisinopril  Cough and Other (See Comments)   Tomato Hives    Only Fresh tomato     Patient Measurements: Height: 5' 7 (170.2 cm) Weight: 128.8 kg (283 lb 15.2 oz) IBW/kg (Calculated) : 66.1 HEPARIN  DW (KG): 95.5  Vital Signs: Temp: 98.3 F (36.8 C) (10/03 0700) Temp Source: Oral (10/03 0700) BP: 99/85 (10/03 0700) Pulse Rate: 62 (10/03 0700)  Labs: Recent Labs    07/05/24 0317 07/05/24 0933 07/06/24 0309 07/07/24 0236  HGB  --  8.8* 9.6*  --   HCT  --  28.5* 31.2*  --   PLT  --  268 290  --   LABPROT 15.8*  --  15.5* 16.6*  INR 1.2  --  1.2 1.3*  CREATININE  --  1.44* 1.58* 1.55*    Estimated Creatinine Clearance: 74.4 mL/min (A) (by C-G formula based on SCr of 1.55 mg/dL (H)).   Medical History: Past Medical History:  Diagnosis Date   Anxiety    Aspirin  allergy    Childhood asthma    Chronic systolic CHF (congestive heart failure) (HCC)    a. EF 20-25% in 2012. b. EF 45-50% in 10/2011 with nonischemic nuc - presumed NICM. c. 12/2014 Echo: Sev depressed LV fxn, sev dil LV, mild LVH, mild MR, sev dil LA, mildly reduced RV fxn.   CKD (chronic kidney disease) stage 2, GFR 60-89 ml/min    Depression    H/O vasectomy 12/2019   High cholesterol    Hypertension    Morbid obesity (HCC)    Nephrolithiasis    OSA on CPAP    Paroxysmal atrial fibrillation (HCC)    Presumed NICM    a. 04/2014 Myoview : EF 26%, glob HK, sev glob HK, ? prior infarct;  b. Never cathed 2/2 CKD.   Renal cell carcinoma (HCC)    a. s/p Rt robotic assisted partial converted to radical nephrectomy on 01/2013.   Troponin level elevated    a. 04/2014, 12/2014: felt due to CHF.   Type II diabetes mellitus (HCC)    Ventricular tachycardia (HCC)    a. appropriate ICD therapy 12/2017     Medications:  Medications Prior to Admission  Medication Sig Dispense Refill Last Dose/Taking   acetaminophen  (TYLENOL ) 325 MG tablet Take 2 tablets (650 mg total) by mouth every 6 (six) hours as needed for fever.   Past Week   allopurinol  (ZYLOPRIM ) 100 MG tablet Take 1 tablet (100 mg total) by mouth daily. 90 tablet 3 06/29/2024 Morning   amiodarone  (PACERONE ) 200 MG tablet Take 1 tablet (200 mg total) by mouth daily. 30 tablet 11 06/29/2024 Morning   amLODipine  (NORVASC ) 10 MG tablet Take 1 tablet (10 mg total) by mouth daily 30 tablet 6 06/29/2024 Morning   carvedilol  (COREG ) 3.125 MG tablet Take 1 tablet (3.125 mg total) by mouth 2 (two) times daily with a meal. 90 tablet 3 06/29/2024 Morning   cefadroxil  (DURICEF) 500 MG capsule Take 1 capsule (500 mg total) by mouth 2 (two) times daily. 180 capsule 3 06/29/2024 Morning   colchicine  0.6 MG tablet Take 1 tablet (0.6 mg total) by mouth once daily. 30 tablet 6 06/29/2024 Morning   cyanocobalamin  (VITAMIN B12) 1000 MCG tablet Take 1 tablet (1,000 mcg total) by mouth  daily. 30 tablet 0 06/29/2024 Morning   cyclobenzaprine  (FLEXERIL ) 5 MG tablet Take 1 tablet (5 mg total) by mouth 3 (three) times daily as needed for muscle spasms 15 tablet 0 Past Month   doxazosin  (CARDURA ) 4 MG tablet Take 2 tablets (8 mg total) by mouth 2 (two) times daily. 120 tablet 6 06/29/2024 Morning   empagliflozin  (JARDIANCE ) 10 MG TABS tablet Take 1 tablet (10 mg total) by mouth daily before breakfast. 30 tablet 11 06/29/2024 Morning   ferrous sulfate  (FEROSUL) 325 (65 FE) MG tablet Take 1 tablet (325 mg total) by mouth daily before breakfast. 90 tablet 1 06/29/2024 Morning   folic acid  (FOLVITE ) 800 MCG tablet Take 0.5 tablets (400 mcg total) by mouth daily. 30 tablet 2 06/29/2024 Morning   gabapentin  (NEURONTIN ) 400 MG capsule Take 1 capsule (400 mg total) by mouth 3 (three) times daily. 30 capsule 6 06/29/2024 Morning   isosorbide -hydrALAZINE  (BIDIL ) 20-37.5 MG tablet Take 2  tablets by mouth 3 (three) times daily. 180 tablet 6 06/29/2024 Morning   meclizine  (ANTIVERT ) 25 MG tablet Take 1 tablet (25 mg total) by mouth 3 (three) times daily as needed for dizziness. 90 tablet 2 06/29/2024 Morning   ondansetron  (ZOFRAN -ODT) 4 MG disintegrating tablet Take 1 tablet (4 mg total) by mouth every 8 (eight) hours as needed for nausea or vomiting. 5 tablet 0 Past Month   pantoprazole  (PROTONIX ) 40 MG tablet Take 1 tablet (40 mg total) by mouth once daily. 90 tablet 3 06/29/2024 Morning   rosuvastatin  (CRESTOR ) 10 MG tablet Take 1 tablet (10 mg total) by mouth daily. 90 tablet 3 06/29/2024 Morning   Semaglutide , 2 MG/DOSE, 8 MG/3ML SOPN Inject 2 mg as directed once a week. 3 mL 5 05/27/2024   sertraline  (ZOLOFT ) 100 MG tablet Take 1.5 tablets (150 mg total) by mouth daily. 135 tablet 3 06/29/2024 Morning   spironolactone  (ALDACTONE ) 25 MG tablet Take 1 tablet (25 mg total) by mouth daily. 60 tablet 6 06/29/2024 Morning   traMADol  HCl 100 MG TABS Take 1 tablet (100 mg) by mouth every 6 (six) hours as needed for pain 90 tablet 0 Past Week   traZODone  (DESYREL ) 100 MG tablet Take 1 tablet (100 mg total) by mouth at bedtime. 30 tablet 11 06/28/2024 Bedtime   warfarin (COUMADIN ) 4 MG tablet Take 12 mg (3 tabs) every Tuesday and 8 mg (2 tabs) all other days or as directed by HF clinic (Patient taking differently: Take 8-12 mg by mouth See admin instructions. Take 12 mg (3 tabs) by mouth every Tuesday and then take 8 mg (2 tabs) by mouth all other days or as directed by HF clinic) 90 tablet 11 06/28/2024 at  4:00 PM   Blood Pressure KIT 1 Units by Does not apply route daily. 1 kit 0    glucose blood test strip Use as directed 4 times daily. 100 each 11    sacubitril -valsartan  (ENTRESTO ) 97-103 MG Take 1 tablet by mouth 2 (two) times daily. (Patient not taking: Reported on 06/29/2024) 60 tablet 6 Not Taking    Assessment: 48 y.o. M s/p HM3 LVAD 07/21/21 presents to LVAD clinic with dizziness,  weakness, and fatigue. Hgb down to 4.6 and notes dark stools past 2-3 days. Pt on warfarin PTA. Admission INR 3.8.  Home dose: warfarin 12 mg (4 mg x 3) every Tue; 8 mg (4 mg x 2) all other days   INR remains subtherapeutic at 1.3, restarted on 9/30 - no overt bleeding or complications noted.  Hgb 9.6, plt 290 - stable on last check. LDH today stable at 173.   S/p endoscopy with esophageal candidiasis, no bleeding - starting fluconazole .    Discussed with MD, given GIB on admission, will reduce INR goal to 1.8-2.3.   Goal of Therapy:  INR 1.8-2.3 Monitor platelets by anticoagulation protocol: Yes   Plan:  Warfarin 12 mg tonight  Daily CBC, INR Will need to watch INR closely with interaction with fluconazole   Thank you for allowing pharmacy to participate in this patient's care,  Suzen Sour, PharmD, BCCCP Clinical Pharmacist  Phone: [phone] 07/07/2024 8:20 AM  Please check AMION for all [facility] Pharmacy phone numbers After 10:00 PM, call Main Pharmacy [phone]

## 2024-07-07 NOTE — Progress Notes (Addendum)
 Patient ID: Jeffrey [name], male   DOB: 10-Dec-1975, 48 y.o.   MRN: [id number]   Advanced Heart Failure VAD Team Note  PCP-Cardiologist: Toribio Fuel, MD  Chief Complaint: GIB Subjective:     INR 1.3 today. Had BM last night and was normal. No melena/hematochezia   Had episode of dizziness yesterday. Entresto  dose reduced, though episode sounds more r/t vertigo.  No further episodes today but numerous PI events, lowest 1.5. Tele stable. Reports PO intake has been normal.   Scr 1.6 c/w baseline, BUN up 23.    Denies dyspnea.   LVAD INTERROGATION:  HeartMate III LVAD:   Flow 5.3 liters/min, speed 6350, power 5.1, PI 3.1, numerous PI events   Objective:    Vital Signs:   Temp:  [97.8 F (36.6 C)-98.3 F (36.8 C)] 98.2 F (36.8 C) (10/03 1119) Pulse Rate:  [61-97] 75 (10/03 1119) Resp:  [18-20] 18 (10/03 1119) BP: (93-131)/(70-93) 93/70 (10/03 1119) SpO2:  [95 %-99 %] 99 % (10/03 1119) Weight:  [128.8 kg] 128.8 kg (10/03 0539) Last BM Date : 07/06/24 Mean arterial Pressure 79   Intake/Output:   Intake/Output Summary (Last 24 hours) at 07/07/2024 1146 Last data filed at 07/07/2024 1024 Gross per 24 hour  Intake --  Output 2200 ml  Net -2200 ml    Physical Exam    General: well appearing, laying in bed. NAD  Cardiac: JVP 6 cm. Mechanical heart sounds with LVAD hum present.  Resp: CTAB  Abdomen: obese, soft, NT, ND   Driveline: Dressing C/D/I. No drainage or redness. Anchor in place. Extremities: no LEE, warm and dry  Neuro: A&Ox 3. Moves all 4 ext w/o difficulty. Affect pleasant   Labs   Basic Metabolic Panel: Recent Labs  Lab 07/03/24 0223 07/04/24 0232 07/05/24 0933 07/06/24 0309 07/07/24 0236  NA 136 138 139 138 137  K 4.4 4.4 4.5 4.3 4.6  CL 108 108 110 111 109  CO2 23 22 24 22  21*  GLUCOSE 98 131* 106* 106* 93  BUN 27* 22* 20 22* 23*  CREATININE 1.79* 1.64* 1.44* 1.58* 1.55*  CALCIUM  8.5* 8.6* 8.9 8.8* 9.2   CBC: Recent Labs  Lab 07/02/24 0404  07/02/24 1854 07/03/24 0223 07/04/24 0232 07/05/24 0933 07/06/24 0309  WBC 5.4  --  5.7 7.6 4.7 5.0  HGB 7.6* 8.5* 7.8* 8.6* 8.8* 9.6*  HCT 24.6* 27.0* 25.8* 27.1* 28.5* 31.2*  MCV 93.9  --  94.5 93.1 92.8 92.3  PLT 197  --  201 227 268 290   INR: Recent Labs  Lab 07/03/24 0223 07/04/24 0232 07/05/24 0317 07/06/24 0309 07/07/24 0236  INR 1.4* 1.2 1.2 1.2 1.3*   Imaging   No results found.  Medications:    Scheduled Medications:  allopurinol   100 mg Oral Daily   amiodarone   200 mg Oral Daily   cefadroxil   500 mg Oral BID   colchicine   0.6 mg Oral Daily   cyanocobalamin   1,000 mcg Oral Daily   fluconazole   100 mg Oral Daily   folic acid   500 mcg Oral Daily   gabapentin   400 mg Oral TID   isosorbide -hydrALAZINE   2 tablet Oral TID   pantoprazole   40 mg Oral BID   rosuvastatin   10 mg Oral Daily   sacubitril -valsartan   1 tablet Oral BID   sertraline   150 mg Oral Daily   traZODone   100 mg Oral QHS   Warfarin - Pharmacist Dosing Inpatient   Does not apply q1600  Infusions:  PRN Medications: acetaminophen , HYDROcodone -acetaminophen   Patient Profile   Jeffrey [name] is a 48 y.o. yo male with a history of  poorly controlled HTN, R renal cell carcinoma s/p nephrectomy, CKD IIIa, DM2, OSA, gout, morbid obesity and systolic HF due to NICM. 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 Down to 4.6 at admission.  Has had 8 units PRBCs. Hgb improved to 8.6 today. - Stool black, heme+.  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 Plan for IV iron OP with ongoing blood tx's.  - on warfarin. H/H stable and trending up.  No further gross bleeding    2. Chronic systolic CHF - Nonischemic CM, end-stage - EF 2012; 20-25%  - Echo  8/22: EF < 20%, severe LV dilation, moderate RV dysfunction.   - RHC (8/22):  well compensated hemodynamics. 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 Euvolemic on exam but numerous PI events, lowest 1.5. Encouraged to increase PO intake - MAP 79. Continue Bidil  2 tablets TID  - Continue Entresto  24-26 mg bid (dose lowered w/ orthostatics) - SGLT2i on hold given low volume status. Eventually add back. - Aware of need to get to BMI ~35 to permit transplant  BMI >40. Now back on Ozempic . Significant weight loss and steadily improving  3. VAD  - s/p HM-3 placement 07/21/21  - VAD interrogated as above - Restarting warfarin. Dosing per PharmD. INR goal lowered to 1.8-2.3. - DL site with evidence of trauma after dressing change over the weekend. Monitor for signs of infection. Continue cefadroxil  for suppression - INR 1.3 today, needs to get to 1.6 prior to discharge. D/w PharmD   4. DL site infection - Continue cefadroxil  for suppression, appreciate ID   5 Atrial fibrillation: Paroxysmal - Had severe AF with RVR 7/22 s/p DC-CV x 2 - Seen by EP. Not a candidate for ablation due to body habitus  (could consider AVN ablation and CRT, if needed)  - Had post-op AF with RVR post VAD - 10/23 S/P DC-CV with restoration of NSR. - Continue amio 200 mg daily - Maintaining SR - Warfarin as above.   6. HTN - MAPs 70-80s today - continue GDMT per above    7. CKD stage 3B  - Has solitary kidney.   - Baseline creatinine is 1.5-1.8.  - Scr stable, 1.6 today  - Off diuretics. Avoid hypotension   8. OSA - Continue CPAP   9. 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 Depression/anxiety - Continue Zoloft  150mg  daily - Referral to psych made OP   12. Morbid obesity - Body mass index is 44.47 kg/m. - Continue Ozempic .   13. Esophageal Candidiasis -  seen on EGD, biopsied  - per GI, will need Diflucan  x 2 wks   I reviewed the LVAD parameters from today, and compared the results to the patient's prior recorded data.  No programming changes were made.  The LVAD is functioning within specified parameters.  The patient performs LVAD self-test daily.  LVAD interrogation was negative  for any significant power changes, alarms or PI events/speed drops.  LVAD equipment check completed and is in good working order.  Back-up equipment present.   LVAD education done on emergency procedures and precautions and reviewed exit site care.  Length of Stay: [address] [place] 07/07/2024, 11:46 AM  VAD Team --- VAD ISSUES ONLY--- Pager [phone] (7am - 7am)  Advanced Heart Failure Team  Pager [phone] (M-F; 7a - 5p)  Please contact [facility] for night-coverage after hours (5p -7a ) and weekends on [email or web address]  Patient seen and examined with the above-signed Advanced Practice Provider and/or Housestaff. I personally reviewed laboratory data, imaging studies and relevant notes. I independently examined the patient and formulated the important aspects of the plan. I have edited the note to reflect any of my changes or salient points. I have personally discussed the plan with the patient and/or family.  Doing well. No further GI bleeding. HGB stable. On warfarin INR 1.2->1.3   Low PIs resolved with increased po intake. Felt like he had brief episode of vertigo last night.   General:  NAD.  HEENT: normal  Neck: supple. JVP not elevated.  Carotids 2+ bilat; no bruits. No lymphadenopathy or thryomegaly appreciated. Cor: LVAD hum.  Lungs: Clear. Abdomen: obese soft, nontender, non-distended. No hepatosplenomegaly. No bruits or masses. Good bowel sounds. Driveline site clean. Anchor in place.  Extremities: no cyanosis, clubbing, rash. Warm no edema  Neuro: alert & oriented x 3. No focal deficits. Moves all 4 without problem    VAD interrogated  personally. Parameters stable.  No further GI bleeding. Continue to load warfarin. Home when INR 1.6 or greater. Discussed warfarin dosing with PharmD personally.  Volume status looks good.   Restart prn meclizine  for possible vertigo   Toribio Fuel, MD  8:25 PM

## 2024-07-07 NOTE — TOC Initial Note (Signed)
 Transition of Care [facility]) - Initial/Assessment Note    Patient Details  Name: Jeffrey [name] MRN: [id number] Date of Birth: [birth year]  [facility]) CM/SW Contact:    Justina Delcia Czar, RN Phone Number: [phone] 07/07/2024, 6:01 PM  Clinical Narrative:                 Spoke to pt at bedside. Pt states he is independent at home. Wife assist with care. Pt has needed DME in home, Scale, Rolling walker with seat, cane RW, CPAP. HF CSW working with patient on S[employer] disability.   Wife will provide transportation to appts.   Will arrange hospital follow up appt with PCP at dc.   Expected Discharge Plan: Home/Self Care Barriers to Discharge: Continued Medical Work up   Patient Goals and CMS Choice Patient states their goals for this hospitalization and ongoing recovery are:: wants to remain independent          Expected Discharge Plan and Services   Discharge Planning Services: CM Consult   Living arrangements for the past 2 months: Single Family Home                                      Prior Living Arrangements/Services Living arrangements for the past 2 months: Single Family Home Lives with:: Spouse Patient language and need for interpreter reviewed:: Yes Do you feel safe going back to the place where you live?: Yes      Need for Family Participation in Patient Care: No (Comment) Care giver support system in place?: No (comment) Current home services: DME (Scale, Rolling walker with seat, cane RW, CPAP) Criminal Activity/Legal Involvement Pertinent to Current Situation/Hospitalization: No - Comment as needed  Activities of Daily Living   ADL Screening (condition at time of admission) Independently performs ADLs?: Yes (appropriate for developmental age) Is the patient deaf or have difficulty hearing?: No Does the patient have difficulty seeing, even when wearing glasses/contacts?: No Does the patient have difficulty concentrating, remembering, or  making decisions?: No  Permission Sought/Granted Permission sought to share information with : Case Manager, Family Supports, PCP Permission granted to share information with : Yes, Verbal Permission Granted              Emotional Assessment Appearance:: Appears stated age Attitude/Demeanor/Rapport: Engaged Affect (typically observed): Accepting Orientation: : Oriented to Self, Oriented to Place, Oriented to  Time, Oriented to Situation   Psych Involvement: No (comment)  Admission diagnosis:  GIB (gastrointestinal bleeding) [K92.2] Patient Active Problem List   Diagnosis Date Noted   AVM (arteriovenous malformation) of small bowel, 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   Candida esophagitis (HCC) 07/03/2024   Symptomatic anemia 07/03/2024   GIB (gastrointestinal bleeding) 06/29/2024   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/10/2022   Presence of left ventricular assist device (LVAD) (HCC) 07/25/2021   Acute on chronic systolic CHF (congestive heart failure) (HCC) 07/18/2021   Cardiogenic shock (HCC)    Atrial fibrillation with RVR (HCC) 04/30/2021   Acute hypoxemic respiratory failure (HCC)    Right wrist pain 04/16/2021   Absolute anemia    Rectal bleeding    Iron deficiency 12/25/2020   Long term current use of anticoagulant - apixaban  12/25/2020   Paroxysmal atrial fibrillation (HCC)    Lactic acidosis 11/22/2020   Hypotension 11/22/2020   Aspiration into airway    ICD (implantable cardioverter-defibrillator) battery depletion  05/15/2020   Endotracheal tube present    Respiratory failure (HCC)    Acute encephalopathy    Acute pulmonary edema (HCC)    NICM (nonischemic cardiomyopathy) (HCC) 04/24/2020   S/P vasectomy 12/20/2019   Anxiety 12/20/2019   Hemoglobin A1C between 7% and 9% indicating borderline diabetic control (HCC) 12/20/2019   Seasonal allergies 12/20/2019   Insomnia 12/20/2019   Vasectomy evaluation 06/20/2019   Visual problems  06/20/2019   Blurry vision, bilateral 06/20/2019   Hyperglycemia 02/13/2019   Hyperosmolar (nonketotic) coma (HCC) 01/19/2019   AICD (automatic cardioverter/defibrillator) present 01/19/2019   Hyperlipidemia 12/07/2018   Follow-up exam 11/22/2018   Class 3 severe obesity due to excess calories with serious comorbidity and body mass index (BMI) of 45.0 to 49.9 in adult (HCC) 11/22/2018   Dizziness 11/22/2018   Lingular pneumonia 08/04/2018   Ventricular tachycardia (HCC) 12/30/2017   PAF (paroxysmal atrial fibrillation) (HCC)    Dilated cardiomyopathy (HCC)    Pollen allergy 02/17/2017   Dyslipidemia 02/17/2017   Hematochezia 11/08/2015   CKD (chronic kidney disease), stage III (HCC) 06/20/2015   Cardiac defibrillator in situ    Chronic systolic CHF (congestive heart failure) (HCC) 03/11/2015   Gouty arthritis 03/11/2015   Chest pain 12/28/2014   Acute renal failure superimposed on stage 3 chronic kidney disease (HCC) 04/08/2014   Aspirin  allergy 04/08/2014   Renal cell carcinoma (HCC)    Gout attack 11/10/2011   Morbid obesity (HCC) 10/23/2011   OSA (obstructive sleep apnea) 10/23/2011   Type 2 diabetes mellitus with stage 3 chronic kidney disease, with long-term current use of insulin  (HCC) 10/22/2011   Hyperlipidemia associated with type 2 diabetes mellitus (HCC) 12/25/2010   Hypertension associated with diabetes (HCC) 12/25/2010   PCP:  Oley Bascom RAMAN, NP Pharmacy:   [facility] [address], [address] [place] [address] Phone: [phone] Fax: [phone]  [id number] - [place], [place] - [address] AT [facility] [address] [place] [address] Phone: [phone] Fax: [phone]     Social Drivers of Health (SDOH) Social History: SDOH Screenings   Food Insecurity: No Food Insecurity (06/29/2024)  Housing: Low Risk  (06/29/2024)  Transportation Needs:  No Transportation Needs (06/29/2024)  Utilities: Not At Risk (06/29/2024)  Alcohol Screen: Low Risk  (10/27/2023)  Depression (PHQ2-9): Low Risk  (06/20/2024)  Recent Concern: Depression (PHQ2-9) - Medium Risk (04/12/2024)  Financial Resource Strain: Medium Risk (07/07/2024)  Physical Activity: Sufficiently Active (10/18/2023)  Social Connections: Moderately Isolated (06/29/2024)  Stress: Stress Concern Present (09/08/2023)  Tobacco Use: Medium Risk (07/03/2024)  Health Literacy: Adequate Health Literacy (11/29/2023)   SDOH Interventions:     Readmission Risk Interventions     No data to display

## 2024-07-07 NOTE — Progress Notes (Signed)
 Patient had bright red bloody stool this evening. Patient has a CBC scheduled in the morning.

## 2024-07-07 NOTE — Progress Notes (Signed)
   07/07/24 2017  BiPAP/CPAP/SIPAP  $ Non-Invasive Home Ventilator  Subsequent  BiPAP/CPAP/SIPAP Pt Type Adult (Pt places himself on CPAP. RT will assist as needed.)  BiPAP/CPAP/SIPAP DREAMSTATIOND  Mask Type Nasal pillows  Respiratory Rate 16 breaths/min  Flow Rate 0 lpm  Patient Home Machine No  Patient Home Mask Yes  Patient Home Tubing Yes  Auto Titrate No  Minimum cmH2O 6 cmH2O  Maximum cmH2O 13 cmH2O  Device Plugged into [employer] Power Outlet Yes

## 2024-07-07 NOTE — Progress Notes (Signed)
 LVAD Coordinator Rounding Note:   Admitted 06/29/24 from [facility] for suspected GIB with a critical Hgb 4.6. Pt seen in [facility] for sick visit with complaints of dizziness and lightheadedness for approximately 3 days. Pt states he noticed his stool was darker but denies any signs of acute bleeding.    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 Lucas under Destination Therapy criteria due to BMI and chronic kidney disease.   Pt lying in bed this morning. No complaints.   Enteroscopy performed. 3 AVMs cauterized and a biopsy taken of candida in esophagus. Pt was started on Diflucan  and will take 100 mg daily for the next 2 weeks per GI. INR today 1.2. CBC not drawn today. Stat order placed.   Pt had trauma to his driveline on Saturday when it was changed. Dressing changed today. Drainage, tenderness, and redness has resolved. Advanced back to weekly dressing changes. See dressing documentation below.    Vital signs: Temp: 98.3 HR: 62 NSR Doppler: not documented Automatic cuff: 99/85 (92) O2 Sat: 94% on RA Wt: 276.9>281.9>284>283.8 lbs  LVAD interrogation reveals:  Speed: 6300 Flow: 4.9 Power: 5.1w PI: 3.4 Hct: 31   Alarms:  none Events: 15 today   Fixed speed:  6300 Low speed limit: 6000   Drive Line: Existing VAD dressing removed and site care performed using sterile technique. Drive line exit site cleaned with Chlora prep applicators x 2, allowed to dry, and Sorbaview dressing with Silverlon patch applied. Exit site healed and incorporated, the velour is fully implanted at exit site. Previous tenderness and burning with cleansing has resolved. No redness, tenderness, drainage, foul odor or rash noted. Drive line anchor re-applied. Pt denies fever or chills. Next dressing change due 07/14/24 by bedside nurse.  Labs:  LDH trend: 147>156>169>157>150>173  INR trend: 3.6>1.4>1.2>1.3  Hgb: 7.0>7.8>8.6>9.6  Anticoagulation Plan: -INR Goal: 1.8-2.3  -ASA Dose: none due to  allergy   Blood Products:  9/25: 2 PRBC 9/26: 2 PRBC 9/27: 1 PRBC 9/28: 1 PRBC 9/29: 1 PRBC  Device: - [employer] single ICD -Therapies: on per BS rep on 08/06/21   Infection:   Drips:    Plan/Recommendations:  Call VAD Coordinator if any VAD equipment or drive line issues. Weekly driveline dressing changes per bedside RN  Isaiah Knoll RN VAD Coordinator  Office: [phone]  24/7 Pager: [phone]

## 2024-07-07 NOTE — Progress Notes (Signed)
 H&V Care Navigation CSW Progress Note  Outpatient Heart Failure CSW met with patient to discuss concerns with social security.  Pt reports that he applied for disability about 3 years ago was approved sometime in the past 2 years got one check then was told his wife made too much money for him to get a check.  CSW explained the different between SSI and SSDI and that with SSI they can reduce the check amount based on household income.  CSW assisted pt in calling SSA who confirmed above.  SSA also informed pt that he is no longer enrolled in disability and would need to complete new application- they were able to make a new appointment for patient for November.  CSW also informed pt how to apply online if he didn't want to wait till then.  Pt reports despite financial struggles he is up to date on all bills- encouraged him to let us  know if he has struggles in the future.  Patient is participating in a Managed Medicaid Plan:  Yes  SDOH Screenings   Food Insecurity: No Food Insecurity (06/29/2024)  Housing: Low Risk  (06/29/2024)  Transportation Needs: No Transportation Needs (06/29/2024)  Utilities: Not At Risk (06/29/2024)  Alcohol Screen: Low Risk  (10/27/2023)  Depression (PHQ2-9): Low Risk  (06/20/2024)  Recent Concern: Depression (PHQ2-9) - Medium Risk (04/12/2024)  Financial Resource Strain: Medium Risk (07/07/2024)  Physical Activity: Sufficiently Active (10/18/2023)  Social Connections: Moderately Isolated (06/29/2024)  Stress: Stress Concern Present (09/08/2023)  Tobacco Use: Medium Risk (07/03/2024)  Health Literacy: Adequate Health Literacy (11/29/2023)   Andriette HILARIO Leech, LCSW Clinical Social Worker [facility] Desk#: [phone] Cell#: [phone]

## 2024-07-07 NOTE — Plan of Care (Signed)
  Problem: Education: Goal: Patient will understand all VAD equipment and how it functions Outcome: Progressing Goal: Patient will be able to verbalize current INR target range and antiplatelet therapy for discharge home Outcome: Progressing   Problem: Cardiac: Goal: LVAD will function as expected and patient will experience no clinical alarms Outcome: Progressing   Problem: Education: Goal: Patient will understand all VAD equipment and how it functions Outcome: Progressing Goal: Patient will be able to verbalize current INR target range and antiplatelet therapy for discharge home Outcome: Progressing   Problem: Cardiac: Goal: LVAD will function as expected and patient will experience no clinical alarms Outcome: Progressing   Problem: Clinical Measurements: Goal: Ability to maintain clinical measurements within normal limits will improve Outcome: Progressing Goal: Respiratory complications will improve Outcome: Progressing Goal: Cardiovascular complication will be avoided Outcome: Progressing   Problem: Elimination: Goal: Will not experience complications related to bowel motility Outcome: Progressing Goal: Will not experience complications related to urinary retention Outcome: Progressing   Problem: Pain Managment: Goal: General experience of comfort will improve and/or be controlled Outcome: Progressing

## 2024-07-08 ENCOUNTER — Other Ambulatory Visit (HOSPITAL_COMMUNITY): Payer: Self-pay

## 2024-07-08 DIAGNOSIS — Z95811 Presence of heart assist device: Secondary | ICD-10-CM | POA: Diagnosis not present

## 2024-07-08 DIAGNOSIS — K2901 Acute gastritis with bleeding: Secondary | ICD-10-CM | POA: Diagnosis not present

## 2024-07-08 LAB — CBC
HCT: 28.9 % — ABNORMAL LOW (ref 39.0–52.0)
Hemoglobin: 8.9 g/dL — ABNORMAL LOW (ref 13.0–17.0)
MCH: 27.7 pg (ref 26.0–34.0)
MCHC: 30.8 g/dL (ref 30.0–36.0)
MCV: 90 fL (ref 80.0–100.0)
Platelets: 299 K/uL (ref 150–400)
RBC: 3.21 MIL/uL — ABNORMAL LOW (ref 4.22–5.81)
RDW: 16.1 % — ABNORMAL HIGH (ref 11.5–15.5)
WBC: 5.3 K/uL (ref 4.0–10.5)
nRBC: 0 % (ref 0.0–0.2)

## 2024-07-08 LAB — BASIC METABOLIC PANEL WITH GFR
Anion gap: 6 (ref 5–15)
BUN: 28 mg/dL — ABNORMAL HIGH (ref 6–20)
CO2: 22 mmol/L (ref 22–32)
Calcium: 9.2 mg/dL (ref 8.9–10.3)
Chloride: 109 mmol/L (ref 98–111)
Creatinine, Ser: 1.55 mg/dL — ABNORMAL HIGH (ref 0.61–1.24)
GFR, Estimated: 55 mL/min — ABNORMAL LOW (ref >60)
Glucose, Bld: 122 mg/dL — ABNORMAL HIGH (ref 70–99)
Potassium: 4.1 mmol/L (ref 3.5–5.1)
Sodium: 137 mmol/L (ref 135–145)

## 2024-07-08 LAB — PROTIME-INR
INR: 1.6 — ABNORMAL HIGH (ref 0.8–1.2)
Prothrombin Time: 19.7 s — ABNORMAL HIGH (ref 11.4–15.2)

## 2024-07-08 LAB — LACTATE DEHYDROGENASE: LDH: 143 U/L (ref 98–192)

## 2024-07-08 MED ORDER — SACUBITRIL-VALSARTAN 24-26 MG PO TABS
1.0000 | ORAL_TABLET | Freq: Two times a day (BID) | ORAL | 11 refills | Status: DC
  Filled 2024-07-08: qty 60, 30d supply, fill #0

## 2024-07-08 MED ORDER — SERTRALINE HCL 50 MG PO TABS
150.0000 mg | ORAL_TABLET | Freq: Every day | ORAL | 11 refills | Status: AC
  Filled 2024-07-08 (×2): qty 90, 30d supply, fill #0
  Filled 2024-08-02: qty 90, 30d supply, fill #1
  Filled 2024-09-07 (×2): qty 90, 30d supply, fill #2
  Filled 2024-10-09: qty 90, 30d supply, fill #3
  Filled 2024-11-10: qty 90, 30d supply, fill #4

## 2024-07-08 MED ORDER — WARFARIN SODIUM 5 MG PO TABS: 8.0000 mg | ORAL_TABLET | Freq: Every day | ORAL | Status: DC

## 2024-07-08 MED ORDER — WARFARIN SODIUM 5 MG PO TABS: 8.0000 mg | ORAL_TABLET | Freq: Once | ORAL | Status: DC

## 2024-07-08 MED ORDER — WARFARIN SODIUM 4 MG PO TABS
ORAL_TABLET | ORAL | 11 refills | Status: AC
  Filled 2024-07-08: qty 90, 45d supply, fill #0
  Filled 2024-08-25: qty 90, 45d supply, fill #1
  Filled 2024-10-09: qty 90, 45d supply, fill #2

## 2024-07-08 MED ORDER — FLUCONAZOLE 100 MG PO TABS
100.0000 mg | ORAL_TABLET | Freq: Every day | ORAL | 0 refills | Status: DC
  Filled 2024-07-08: qty 14, 14d supply, fill #0

## 2024-07-08 NOTE — Plan of Care (Signed)

## 2024-07-08 NOTE — TOC Transition Note (Signed)
 Transition of Care [facility]) - Discharge Note   Patient Details  Name: Jeffrey [name] MRN: [id number] Date of Birth: 19-Nov-1975  Transition of Care [facility]) CM/SW Contact:  Roxie KANDICE Stain, RN Phone Number: 07/08/2024, 12:18 PM   Clinical Narrative:    [name] is stable to discharge home. Follow up apt on [facility]S. No ICM (Inpatient Care Management) needs at this time.    Final next level of care: Home/Self Care Barriers to Discharge: Barriers Resolved   Patient Goals and CMS Choice Patient states their goals for this hospitalization and ongoing recovery are:: wants to remain independent          Discharge Placement  Home                     Discharge Plan and Services Additional resources added to the After Visit Summary for     Discharge Planning Services: CM Consult                                 Social Drivers of Health (SDOH) Interventions SDOH Screenings   Food Insecurity: No Food Insecurity (06/29/2024)  Housing: Low Risk  (06/29/2024)  Transportation Needs: No Transportation Needs (06/29/2024)  Utilities: Not At Risk (06/29/2024)  Alcohol Screen: Low Risk  (10/27/2023)  Depression (PHQ2-9): Low Risk  (06/20/2024)  Recent Concern: Depression (PHQ2-9) - Medium Risk (04/12/2024)  Financial Resource Strain: Medium Risk (07/07/2024)  Physical Activity: Sufficiently Active (10/18/2023)  Social Connections: Moderately Isolated (06/29/2024)  Stress: Stress Concern Present (09/08/2023)  Tobacco Use: Medium Risk (07/03/2024)  Health Literacy: Adequate Health Literacy (11/29/2023)     Readmission Risk Interventions    07/08/2024   12:18 PM  Readmission Risk Prevention Plan  Transportation Screening Complete  PCP or Specialist Appt within 5-7 Days Complete  Home Care Screening Complete  Medication Review (RN CM) Complete

## 2024-07-08 NOTE — Plan of Care (Signed)
  Problem: Education: Goal: Patient will understand all VAD equipment and how it functions Outcome: Progressing Goal: Patient will be able to verbalize current INR target range and antiplatelet therapy for discharge home Outcome: Progressing   Problem: Cardiac: Goal: LVAD will function as expected and patient will experience no clinical alarms Outcome: Progressing   Problem: Education: Goal: Patient will understand all VAD equipment and how it functions Outcome: Progressing Goal: Patient will be able to verbalize current INR target range and antiplatelet therapy for discharge home Outcome: Progressing   Problem: Cardiac: Goal: LVAD will function as expected and patient will experience no clinical alarms Outcome: Progressing   Problem: Education: Goal: Knowledge of General Education information will improve Description: Including pain rating scale, medication(s)/side effects and non-pharmacologic comfort measures Outcome: Progressing   Problem: Health Behavior/Discharge Planning: Goal: Ability to manage health-related needs will improve Outcome: Progressing   Problem: Clinical Measurements: Goal: Ability to maintain clinical measurements within normal limits will improve Outcome: Progressing Goal: Will remain free from infection Outcome: Progressing Goal: Diagnostic test results will improve Outcome: Progressing Goal: Respiratory complications will improve Outcome: Progressing Goal: Cardiovascular complication will be avoided Outcome: Progressing   Problem: Activity: Goal: Risk for activity intolerance will decrease Outcome: Progressing   Problem: Nutrition: Goal: Adequate nutrition will be maintained Outcome: Progressing   Problem: Coping: Goal: Level of anxiety will decrease Outcome: Progressing   Problem: Elimination: Goal: Will not experience complications related to bowel motility Outcome: Progressing Goal: Will not experience complications related to  urinary retention Outcome: Progressing   Problem: Pain Managment: Goal: General experience of comfort will improve and/or be controlled Outcome: Progressing   Problem: Safety: Goal: Ability to remain free from injury will improve Outcome: Progressing   Problem: Skin Integrity: Goal: Risk for impaired skin integrity will decrease Outcome: Progressing

## 2024-07-08 NOTE — Progress Notes (Signed)
 PHARMACY Consult - VAD patient   Allergies  Allergen Reactions   Aspirin  Shortness Of Breath, Itching and Rash     Burning sensation (Patient reports he tolerates other NSAIDS)    Bee Venom Hives and Swelling   Lisinopril  Cough and Other (See Comments)   Tomato Hives    Only Fresh tomato     Patient Measurements: Height: 5' 7 (170.2 cm) Weight: 127.7 kg (281 lb 8.4 oz) IBW/kg (Calculated) : 66.1 HEPARIN  DW (KG): 95.5  Vital Signs: Temp: 97.6 F (36.4 C) (10/04 0735) Temp Source: Oral (10/04 0735) BP: 101/83 (10/04 0735) Pulse Rate: 65 (10/04 0735)  Labs: Recent Labs    07/06/24 0309 07/07/24 0236 07/08/24 0202  HGB 9.6*  --  8.9*  HCT 31.2*  --  28.9*  PLT 290  --  299  LABPROT 15.5* 16.6* 19.7*  INR 1.2 1.3* 1.6*  CREATININE 1.58* 1.55* 1.55*    Estimated Creatinine Clearance: 74 mL/min (A) (by C-G formula based on SCr of 1.55 mg/dL (H)).   Medical History: Past Medical History:  Diagnosis Date   Anxiety    Aspirin  allergy    Childhood asthma    Chronic systolic CHF (congestive heart failure) (HCC)    a. EF 20-25% in 2012. b. EF 45-50% in 10/2011 with nonischemic nuc - presumed NICM. c. 12/2014 Echo: Sev depressed LV fxn, sev dil LV, mild LVH, mild MR, sev dil LA, mildly reduced RV fxn.   CKD (chronic kidney disease) stage 2, GFR 60-89 ml/min    Depression    H/O vasectomy 12/2019   High cholesterol    Hypertension    Morbid obesity (HCC)    Nephrolithiasis    OSA on CPAP    Paroxysmal atrial fibrillation (HCC)    Presumed NICM    a. 04/2014 Myoview : EF 26%, glob HK, sev glob HK, ? prior infarct;  b. Never cathed 2/2 CKD.   Renal cell carcinoma (HCC)    a. s/p Rt robotic assisted partial converted to radical nephrectomy on 01/2013.   Troponin level elevated    a. 04/2014, 12/2014: felt due to CHF.   Type II diabetes mellitus (HCC)    Ventricular tachycardia (HCC)    a. appropriate ICD therapy 12/2017    Medications:  Medications Prior to Admission   Medication Sig Dispense Refill Last Dose/Taking   acetaminophen  (TYLENOL ) 325 MG tablet Take 2 tablets (650 mg total) by mouth every 6 (six) hours as needed for fever.   Past Week   allopurinol  (ZYLOPRIM ) 100 MG tablet Take 1 tablet (100 mg total) by mouth daily. 90 tablet 3 06/29/2024 Morning   amiodarone  (PACERONE ) 200 MG tablet Take 1 tablet (200 mg total) by mouth daily. 30 tablet 11 06/29/2024 Morning   amLODipine  (NORVASC ) 10 MG tablet Take 1 tablet (10 mg total) by mouth daily 30 tablet 6 06/29/2024 Morning   carvedilol  (COREG ) 3.125 MG tablet Take 1 tablet (3.125 mg total) by mouth 2 (two) times daily with a meal. 90 tablet 3 06/29/2024 Morning   cefadroxil  (DURICEF) 500 MG capsule Take 1 capsule (500 mg total) by mouth 2 (two) times daily. 180 capsule 3 06/29/2024 Morning   colchicine  0.6 MG tablet Take 1 tablet (0.6 mg total) by mouth once daily. 30 tablet 6 06/29/2024 Morning   cyanocobalamin  (VITAMIN B12) 1000 MCG tablet Take 1 tablet (1,000 mcg total) by mouth daily. 30 tablet 0 06/29/2024 Morning   cyclobenzaprine  (FLEXERIL ) 5 MG tablet Take 1 tablet (5 mg total) by  mouth 3 (three) times daily as needed for muscle spasms 15 tablet 0 Past Month   doxazosin  (CARDURA ) 4 MG tablet Take 2 tablets (8 mg total) by mouth 2 (two) times daily. 120 tablet 6 06/29/2024 Morning   empagliflozin  (JARDIANCE ) 10 MG TABS tablet Take 1 tablet (10 mg total) by mouth daily before breakfast. 30 tablet 11 06/29/2024 Morning   ferrous sulfate  (FEROSUL) 325 (65 FE) MG tablet Take 1 tablet (325 mg total) by mouth daily before breakfast. 90 tablet 1 06/29/2024 Morning   folic acid  (FOLVITE ) 800 MCG tablet Take 0.5 tablets (400 mcg total) by mouth daily. 30 tablet 2 06/29/2024 Morning   gabapentin  (NEURONTIN ) 400 MG capsule Take 1 capsule (400 mg total) by mouth 3 (three) times daily. 30 capsule 6 06/29/2024 Morning   isosorbide -hydrALAZINE  (BIDIL ) 20-37.5 MG tablet Take 2 tablets by mouth 3 (three) times daily. 180 tablet 6  06/29/2024 Morning   meclizine  (ANTIVERT ) 25 MG tablet Take 1 tablet (25 mg total) by mouth 3 (three) times daily as needed for dizziness. 90 tablet 2 06/29/2024 Morning   ondansetron  (ZOFRAN -ODT) 4 MG disintegrating tablet Take 1 tablet (4 mg total) by mouth every 8 (eight) hours as needed for nausea or vomiting. 5 tablet 0 Past Month   pantoprazole  (PROTONIX ) 40 MG tablet Take 1 tablet (40 mg total) by mouth once daily. 90 tablet 3 06/29/2024 Morning   rosuvastatin  (CRESTOR ) 10 MG tablet Take 1 tablet (10 mg total) by mouth daily. 90 tablet 3 06/29/2024 Morning   Semaglutide , 2 MG/DOSE, 8 MG/3ML SOPN Inject 2 mg as directed once a week. 3 mL 5 05/27/2024   sertraline  (ZOLOFT ) 100 MG tablet Take 1.5 tablets (150 mg total) by mouth daily. 135 tablet 3 06/29/2024 Morning   spironolactone  (ALDACTONE ) 25 MG tablet Take 1 tablet (25 mg total) by mouth daily. 60 tablet 6 06/29/2024 Morning   traMADol  HCl 100 MG TABS Take 1 tablet (100 mg) by mouth every 6 (six) hours as needed for pain 90 tablet 0 Past Week   traZODone  (DESYREL ) 100 MG tablet Take 1 tablet (100 mg total) by mouth at bedtime. 30 tablet 11 06/28/2024 Bedtime   warfarin (COUMADIN ) 4 MG tablet Take 12 mg (3 tabs) every Tuesday and 8 mg (2 tabs) all other days or as directed by HF clinic (Patient taking differently: Take 8-12 mg by mouth See admin instructions. Take 12 mg (3 tabs) by mouth every Tuesday and then take 8 mg (2 tabs) by mouth all other days or as directed by HF clinic) 90 tablet 11 06/28/2024 at  4:00 PM   Blood Pressure KIT 1 Units by Does not apply route daily. 1 kit 0    glucose blood test strip Use as directed 4 times daily. 100 each 11    sacubitril -valsartan  (ENTRESTO ) 97-103 MG Take 1 tablet by mouth 2 (two) times daily. (Patient not taking: Reported on 06/29/2024) 60 tablet 6 Not Taking    Assessment: 48 y.o. 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 Hgb down to 4.6 and notes dark  stools past 2-3 days. Pt on warfarin PTA. Admission INR 3.8.  Home dose: warfarin 12 mg (4 mg x 3) every Tue; 8 mg (4 mg x 2) all other days   07/08/24: INR remains subtherapeutic at 1.6, restarted on 9/30 - no overt bleeding or complications noted. Hgb 8.9, plt 299 - stable on last check. LDH today stable at 143.   S/p endoscopy with  esophageal candidiasis, no bleeding - starting fluconazole . Discussed with MD, given GIB on admission, will reduce INR goal to 1.8-2.3.   Goal of Therapy:  INR 1.8-2.3 Monitor platelets by anticoagulation protocol: Yes   Plan:  Warfarin 8 mg tonight  Daily CBC, INR Will need to watch INR closely with interaction with fluconazole   Thank you for allowing pharmacy to participate in this patient's care,  Morna Breach, PharmD PGY2 Cardiology Pharmacy Resident 07/08/2024 9:49 AM  Please check AMION for all [facility] Pharmacy phone numbers After 10:00 PM, call Main Pharmacy [phone]

## 2024-07-10 ENCOUNTER — Telehealth: Payer: Self-pay

## 2024-07-10 DIAGNOSIS — I5022 Chronic systolic (congestive) heart failure: Secondary | ICD-10-CM

## 2024-07-10 DIAGNOSIS — Z4502 Encounter for adjustment and management of automatic implantable cardiac defibrillator: Secondary | ICD-10-CM

## 2024-07-10 NOTE — Transitions of Care (Post Inpatient/ED Visit) (Unsigned)
 07/12/2024  Name: Jeffrey [name] MRN: [id number] DOB: 1976/05/10  Today's TOC FU Call Status: Today's TOC FU Call Status:: Successful TOC FU Call Completed TOC FU Call Complete Date: 07/10/24 Patient's Name and Date of Birth confirmed.  Transition Care Management Follow-up Telephone Call Date of Discharge: 07/08/24 Discharge Facility: [name] Type of Discharge: Inpatient Admission Primary Inpatient Discharge Diagnosis:: GI bleed, acute blood loss anemia, esophageal candidias How have you been since you were released from the hospital?: Better Any questions or concerns?: No  Items Reviewed: Did you receive and understand the discharge instructions provided?: Yes Medications obtained,verified, and reconciled?: Yes (Medications Reviewed) Any new allergies since your discharge?: No Dietary orders reviewed?: Yes Type of Diet Ordered:: low salt heart healthy Do you have support at home?: Yes People in Home [RPT]: parent(s) Name of Support/Comfort Primary Source: Jeffrey [name]  Medications Reviewed Today: Medications Reviewed Today     Reviewed by Jeffrey Stroble E, RN (Registered Nurse) on 07/10/24 at 1235  Med List Status: <None>   Medication Order Taking? Sig Documenting Provider Last Dose Status Informant  acetaminophen  (TYLENOL ) 325 MG tablet [id number] Yes Take 2 tablets (650 mg total) by mouth every 6 (six) hours as needed for fever. Jeffrey Jeffrey BIRCH, NP  Active Self, Pharmacy Records           Med Note (Jeffrey [name]   Thu Jun 29, 2024  5:35 PM)    allopurinol  (ZYLOPRIM ) 100 MG tablet [id number] Yes Take 1 tablet (100 mg total) by mouth daily. Bensimhon, Jeffrey SAUNDERS, MD  Active Self, Pharmacy Records  amiodarone  (PACERONE ) 200 MG tablet [id number] Yes Take 1 tablet (200 mg total) by mouth daily. Bensimhon, Jeffrey SAUNDERS, MD  Active Self, Pharmacy Records  Blood Pressure KIT [id number] Yes 1 Units by Does not apply route daily. Jeffrey Bascom RAMAN, NP  Active Self, Pharmacy Records   cefadroxil  (DURICEF) 500 MG capsule [id number] Yes Take 1 capsule (500 mg total) by mouth 2 (two) times daily. Bensimhon, Jeffrey SAUNDERS, MD  Active Self, Pharmacy Records  colchicine  0.6 MG tablet [id number] Yes Take 1 tablet (0.6 mg total) by mouth once daily. Bensimhon, Jeffrey SAUNDERS, MD  Active Self, Pharmacy Records  cyanocobalamin  (VITAMIN B12) 1000 MCG tablet [id number] Yes Take 1 tablet (1,000 mcg total) by mouth daily. Jeffrey Bascom RAMAN, NP  Active Self, Pharmacy Records  cyclobenzaprine  (FLEXERIL ) 5 MG tablet [id number] Yes Take 1 tablet (5 mg total) by mouth 3 (three) times daily as needed for muscle spasms   Active Self, Pharmacy Records  ferrous sulfate  (FEROSUL) 325 (65 FE) MG tablet [id number]  Take 1 tablet (325 mg total) by mouth daily before breakfast.  Patient not taking: Reported on 07/10/2024   Jeffrey Bascom RAMAN, NP  Active Self, Pharmacy Records  fluconazole  (DIFLUCAN ) 100 MG tablet [id number] Yes Take 1 tablet (100 mg total) by mouth daily. Bensimhon, Jeffrey R, MD  Active   folic acid  (FOLVITE ) 800 MCG tablet [id number] Yes Take 0.5 tablets (400 mcg total) by mouth daily. Jeffrey Bascom RAMAN, NP  Active Self, Pharmacy Records  gabapentin  (NEURONTIN ) 400 MG capsule [id number] Yes Take 1 capsule (400 mg total) by mouth 3 (three) times daily. Bensimhon, Jeffrey SAUNDERS, MD  Active Self, Pharmacy Records  glucose blood test strip [id number] Yes Use as directed 4 times daily. Jeffrey Sia I, NP  Active Self, Pharmacy Records  isosorbide -hydrALAZINE  (BIDIL ) 20-37.5 MG tablet [id number] Yes Take 2 tablets by mouth 3 (three) times daily. Bensimhon, Jeffrey SAUNDERS, MD  Active Self, Pharmacy Records  meclizine  (ANTIVERT ) 25 MG tablet [id number] Yes Take 1 tablet (25 mg total) by mouth 3 (three) times daily as needed for dizziness. Bensimhon, Jeffrey SAUNDERS, MD  Active Self, Pharmacy Records  ondansetron  (ZOFRAN -ODT) 4 MG disintegrating tablet [id number] Yes Take 1 tablet (4 mg total) by mouth every 8 (eight) hours as needed for  nausea or vomiting. Jeffrey Morene PARAS, MD  Active Self, Pharmacy Records  pantoprazole  (PROTONIX ) 40 MG tablet [id number]  Take 1 tablet (40 mg total) by mouth once daily.  Patient not taking: Reported on 07/10/2024   Bensimhon, Jeffrey SAUNDERS, MD  Active Self, Pharmacy Records  rosuvastatin  (CRESTOR ) 10 MG tablet [id number] Yes Take 1 tablet (10 mg total) by mouth daily. Bensimhon, Jeffrey SAUNDERS, MD  Active Self, Pharmacy Records  sacubitril -valsartan  (ENTRESTO ) 24-26 MG [id number] Yes Take 1 tablet by mouth 2 (two) times daily. Bensimhon, Jeffrey SAUNDERS, MD  Active   Semaglutide , 2 MG/DOSE, 8 MG/3ML SOPN [id number]  Inject 2 mg as directed once a week.  Patient not taking: Reported on 07/10/2024   Jeffrey Bascom RAMAN, NP  Active Self, Pharmacy Records           Med Note (Jeffrey [name] Jun 29, 2024  5:31 PM) Take on Sundays   sertraline  (ZOLOFT ) 50 MG tablet [id number] Yes Take 3 tablets (150 mg total) by mouth daily. Bensimhon, Jeffrey SAUNDERS, MD  Active   traMADol  HCl 100 MG TABS [id number]  Take 1 tablet (100 mg) by mouth every 6 (six) hours as needed for pain  Patient not taking: Reported on 07/10/2024   Bensimhon, Jeffrey SAUNDERS, MD  Active Self, Pharmacy Records  traZODone  (DESYREL ) 100 MG tablet [id number] Yes Take 1 tablet (100 mg total) by mouth at bedtime. Bensimhon, Jeffrey SAUNDERS, MD  Active Self, Pharmacy Records  warfarin (COUMADIN ) 4 MG tablet [id number] Yes Take 2 tablets by mouth daily (8 mg total)  or as directed by HF clinic Bensimhon, Jeffrey SAUNDERS, MD  Active             Home Care and Equipment/Supplies: Were Home Health Services Ordered?: No Any new equipment or medical supplies ordered?: No  Functional Questionnaire: Do you need assistance with bathing/showering or dressing?: Yes Do you need assistance with meal preparation?: No Do you need assistance with eating?: No Do you have difficulty maintaining continence: No Do you need assistance with getting out of bed/getting out of a chair/moving?:  No Do you have difficulty managing or taking your medications?: No  Follow up appointments reviewed: PCP Follow-up appointment confirmed?: No (patient has called primary office and is awaiting return call for appointment) Specialist Hospital Follow-up appointment confirmed?: Yes Date of Specialist follow-up appointment?: 07/21/24 Follow-Up Specialty Provider:: Dr. Chet Do you need transportation to your follow-up appointment?: No Do you understand care options if your condition(s) worsen?: Yes-patient verbalized understanding   Discussed and offered 30 day TOC program.  Patient declined stating he will continue follow up with his assigned CCM RN nurse case manager.     .  The patient has been provided with contact information for the care management team and has been advised to call with any health -related questions or concerns.  The patient verbalized understanding with current plan of care.  The patient is directed to their insurance card regarding availability of benefits coverage.    Arvin Seip RN, BSN, CCM [employer], Population Health Case Manager Phone: [phone]

## 2024-07-12 NOTE — Patient Instructions (Signed)
 Visit Information  Thank you for taking time to visit with me today. You are currently being followed by the longitudinal nurse case manager Rosaline Brilliant, RN. You have elected to resume ongoing follow up with your case manager.    Patient instructions: Schedule hospital follow up visit with your primary care provider Report any signs of bleeding to your provider.  Seek emergency medical services for more severe symptoms Take your medications as prescribed. Avoid non steroidal anti-inflammatory medications and spicy foods Contact your provider for any new or ongoing symptoms.    Patient verbalizes understanding of instructions and care plan provided today and agrees to view in MyChart. Active MyChart status and patient understanding of how to access instructions and care plan via MyChart confirmed with patient.     The patient has been provided with contact information for the care management team and has been advised to call with any health related questions or concerns.  No further follow up required: with Transition of care. Patient will be continued to be followed by the longitudinal nurse case manager Rosaline Brilliant, RN.   Please call the care guide team at [phone] if you need to cancel or reschedule your appointment.   Please call the Suicide and Crisis Lifeline: 988 call the [place]  National Suicide Prevention Lifeline: 647-235-2434 or TTY: 9305759946 TTY 505-067-5790) to talk to a trained counselor if you are experiencing a Mental Health or Behavioral Health Crisis or need someone to talk to.  Arvin Seip RN, BSN, CCM [employer], Population Health Case Manager Phone: [phone]

## 2024-07-13 ENCOUNTER — Telehealth: Payer: Self-pay

## 2024-07-13 NOTE — Progress Notes (Signed)
 Complex Care Management Note  Care Guide Note 07/13/2024 Name: [name] MRN: [id number] DOB: 1976-02-08  [name] is a 48 y.o. year old male who sees Oley Bascom RAMAN, NP for primary care. I reached out to [name] by phone today to offer complex care management services.  Mr. Ellinwood was given information about Complex Care Management services today including:   The Complex Care Management services include support from the care team which includes your Nurse Care Manager, Clinical Social Worker, or Pharmacist.  The Complex Care Management team is here to help remove barriers to the health concerns and goals most important to you. Complex Care Management services are voluntary, and the patient may decline or stop services at any time by request to their care team member.   Complex Care Management Consent Status: Patient agreed to services and verbal consent obtained.   Follow up plan:  Telephone appointment with complex care management team member scheduled for:  08/07/2024  Encounter Outcome:  Patient Scheduled  Jeoffrey Buffalo , RMA     [place]  [facility] Guide  Direct Dial: [phone]  Website: [email or web address]

## 2024-07-14 ENCOUNTER — Ambulatory Visit (HOSPITAL_COMMUNITY)
Admit: 2024-07-14 | Discharge: 2024-07-14 | Disposition: A | Discharge status: Home / Self Care | Source: Ambulatory Visit | Attending: Cardiology | Admitting: Cardiology

## 2024-07-14 ENCOUNTER — Ambulatory Visit (HOSPITAL_COMMUNITY): Payer: Self-pay | Admitting: Pharmacist

## 2024-07-14 ENCOUNTER — Other Ambulatory Visit: Payer: Self-pay

## 2024-07-14 ENCOUNTER — Other Ambulatory Visit (HOSPITAL_COMMUNITY): Payer: Self-pay | Admitting: Unknown Physician Specialty

## 2024-07-14 DIAGNOSIS — Z7901 Long term (current) use of anticoagulants: Secondary | ICD-10-CM

## 2024-07-14 DIAGNOSIS — Z95811 Presence of heart assist device: Secondary | ICD-10-CM

## 2024-07-14 DIAGNOSIS — I5022 Chronic systolic (congestive) heart failure: Secondary | ICD-10-CM | POA: Insufficient documentation

## 2024-07-14 LAB — CBC
HCT: 29.5 % — ABNORMAL LOW (ref 39.0–52.0)
Hemoglobin: 9 g/dL — ABNORMAL LOW (ref 13.0–17.0)
MCH: 26.8 pg (ref 26.0–34.0)
MCHC: 30.5 g/dL (ref 30.0–36.0)
MCV: 87.8 fL (ref 80.0–100.0)
Platelets: 321 K/uL (ref 150–400)
RBC: 3.36 MIL/uL — ABNORMAL LOW (ref 4.22–5.81)
RDW: 17.6 % — ABNORMAL HIGH (ref 11.5–15.5)
WBC: 6 K/uL (ref 4.0–10.5)
nRBC: 0 % (ref 0.0–0.2)

## 2024-07-14 LAB — PROTIME-INR
INR: 2.5 — ABNORMAL HIGH (ref 0.8–1.2)
Prothrombin Time: 28.5 s — ABNORMAL HIGH (ref 11.4–15.2)

## 2024-07-17 ENCOUNTER — Other Ambulatory Visit

## 2024-07-17 NOTE — Patient Instructions (Signed)
 Visit Information  Mr. Wafer was given information about Medicaid Managed Care team care coordination services as a part of their [facility] Medicaid benefit. [name]   If you would like to schedule transportation through your [facility] plan, please call the following number at least 2 days in advance of your appointment: [phone]  For information about your ride after you set it up, call Ride Assist at [phone]. Use this number to activate a Will Call pickup, or if your transportation is late for a scheduled pickup. Use this number, too, if you need to make a change or cancel a previously scheduled reservation.  If you need transportation services right away, call [phone]. The after-hours call center is staffed 24 hours to handle ride assistance and urgent reservation requests (including discharges) 365 days a year. Urgent trips include sick visits, hospital discharge requests and life-sustaining treatment.  Call the [facility] Line at [phone], at any time, 24 hours a day, 7 days a week. If you are in danger or need immediate medical attention call 911.   Patient verbalizes understanding of instructions and care plan provided today and agrees to view in MyChart. Active MyChart status and patient understanding of how to access instructions and care plan via MyChart confirmed with patient.     The Patient                                              will call PCP office* as advised to schedule hospital follow-up visit.  Telephone follow up appointment with Managed Medicaid care management team member scheduled for: 07/31/24 at 2 PM  [name], RN MSN [place]  [facility] Population Health RN Care Manager Direct Dial: [phone]  Fax: [phone]   Following is a copy of your plan of care:  There are no care plans that you recently modified to display for this patient.

## 2024-07-17 NOTE — Patient Outreach (Signed)
 Complex Care Management   Visit Note  07/17/2024  Name:  Jeffrey [name] MRN: [id number] DOB: [birth year]  Situation: Referral received for Complex Care Management related to Heart Failure and recent hospitalization for GI bleed I obtained verbal consent from Patient.  Visit completed with Patient  on the phone  Background:   Past Medical History:  Diagnosis Date   Anxiety    Aspirin  allergy    Childhood asthma    Chronic systolic CHF (congestive heart failure) (HCC)    a. EF 20-25% in 2012. b. EF 45-50% in 10/2011 with nonischemic nuc - presumed NICM. c. 12/2014 Echo: Sev depressed LV fxn, sev dil LV, mild LVH, mild MR, sev dil LA, mildly reduced RV fxn.   CKD (chronic kidney disease) stage 2, GFR 60-89 ml/min    Depression    H/O vasectomy 12/2019   High cholesterol    Hypertension    Morbid obesity (HCC)    Nephrolithiasis    OSA on CPAP    Paroxysmal atrial fibrillation (HCC)    Presumed NICM    a. 04/2014 Myoview : EF 26%, glob HK, sev glob HK, ? prior infarct;  b. Never cathed 2/2 CKD.   Renal cell carcinoma (HCC)    a. s/p Rt robotic assisted partial converted to radical nephrectomy on 01/2013.   Troponin level elevated    a. 04/2014, 12/2014: felt due to CHF.   Type II diabetes mellitus (HCC)    Ventricular tachycardia (HCC)    a. appropriate ICD therapy 12/2017    Assessment: Patient Reported Symptoms:  Cognitive Cognitive Status: Able to follow simple commands, Alert and oriented to person, place, and time, Normal speech and language skills Cognitive/Intellectual Conditions Management [RPT]: None reported or documented in medical history or problem list      Neurological Neurological Review of Symptoms: Not assessed    HEENT HEENT Symptoms Reported: Not assessed      Cardiovascular Cardiovascular Symptoms Reported: No symptoms reported Does patient have uncontrolled Hypertension?: Yes Is patient checking Blood Pressure at home?: Yes Patient's Recent BP reading at  home: 95/72 Cardiovascular Management Strategies: Medical device, Medication therapy, Weight management Do You Have a Working Readable Scale?: Yes Weight: 281 lb (127.5 kg) (Patient reported) Cardiovascular Comment: Patient reminded of appointment with Dr. Chet 07/21/24  Respiratory Respiratory Symptoms Reported: No symptoms reported    Endocrine Endocrine Symptoms Reported: No symptoms reported Is patient diabetic?: Yes Is patient checking blood sugars at home?: Yes List most recent blood sugar readings, include date and time of day: The highest I've had is 120    Gastrointestinal Gastrointestinal Symptoms Reported: No symptoms reported Additional Gastrointestinal Details: Patient reports only symptoms prior to hospitalization were dizziness and weakness. Patient denies dizziness, weakness, or blood in his stools. He reports regular BMs. Last BM this morning. Note that patient has not had hospital follow-up with PCP. Patient states he did not get a call back from office and will try to call again this afternoon. Reviewed medication changes at discharge and ensured patient has made changes as ordered. Confirmed patient is taking ferrous sulfate  Gastrointestinal Management Strategies: Medication therapy    Genitourinary Genitourinary Symptoms Reported: Not assessed    Integumentary Integumentary Symptoms Reported: Not assessed    Musculoskeletal Musculoskelatal Symptoms Reviewed: No symptoms reported Musculoskeletal Comment: Denies falls since previous CMRN visit Falls in the past year?: No Number of falls in past year: 1 or less Was there an injury with Fall?: No Fall Risk Category Calculator: 0 Patient Fall  Risk Level: Low Fall Risk Patient at Risk for Falls Due to: No Fall Risks Fall risk Follow up: Falls evaluation completed, Education provided, Falls prevention discussed  Psychosocial Psychosocial Symptoms Reported: Not assessed Additional Psychological Details: Patient  reports depression is about the same Behavioral Health Comment: Patient reports he has not reached out to a behavioral health provider yet        07/17/2024    PHQ2-9 Depression Screening   Little interest or pleasure in doing things    Feeling down, depressed, or hopeless    PHQ-2 - Total Score    Trouble falling or staying asleep, or sleeping too much    Feeling tired or having little energy    Poor appetite or overeating     Feeling bad about yourself - or that you are a failure or have let yourself or your family down    Trouble concentrating on things, such as reading the newspaper or watching television    Moving or speaking so slowly that other people could have noticed.  Or the opposite - being so fidgety or restless that you have been moving around a lot more than usual    Thoughts that you would be better off dead, or hurting yourself in some way    PHQ2-9 Total Score    If you checked off any problems, how difficult have these problems made it for you to do your work, take care of things at home, or get along with other people    Depression Interventions/Treatment      There were no vitals filed for this visit.  Medications Reviewed Today     Reviewed by Arno Rosaline SQUIBB, RN (Registered Nurse) on 07/17/24 at 1304  Med List Status: <None>   Medication Order Taking? Sig Documenting Provider Last Dose Status Informant  acetaminophen  (TYLENOL ) 325 MG tablet [id number]  Take 2 tablets (650 mg total) by mouth every 6 (six) hours as needed for fever. Lenetta Greig BIRCH, NP  Active Self, Pharmacy Records           Med Note (SATTERFIELD, TEENA BRAVO   Thu Jun 29, 2024  5:35 PM)    allopurinol  (ZYLOPRIM ) 100 MG tablet [id number]  Take 1 tablet (100 mg total) by mouth daily. Bensimhon, Toribio SAUNDERS, MD  Active Self, Pharmacy Records  amiodarone  (PACERONE ) 200 MG tablet [id number]  Take 1 tablet (200 mg total) by mouth daily. Bensimhon, Toribio SAUNDERS, MD  Active Self, Pharmacy Records  Blood  Pressure KIT [id number]  1 Units by Does not apply route daily. Oley Bascom RAMAN, NP  Active Self, Pharmacy Records  cefadroxil  (DURICEF) 500 MG capsule [id number]  Take 1 capsule (500 mg total) by mouth 2 (two) times daily. Bensimhon, Toribio SAUNDERS, MD  Active Self, Pharmacy Records  colchicine  0.6 MG tablet [id number]  Take 1 tablet (0.6 mg total) by mouth once daily. Bensimhon, Toribio SAUNDERS, MD  Active Self, Pharmacy Records  cyanocobalamin  (VITAMIN B12) 1000 MCG tablet [id number]  Take 1 tablet (1,000 mcg total) by mouth daily. Oley Bascom RAMAN, NP  Active Self, Pharmacy Records  cyclobenzaprine  (FLEXERIL ) 5 MG tablet [id number]  Take 1 tablet (5 mg total) by mouth 3 (three) times daily as needed for muscle spasms   Active Self, Pharmacy Records  ferrous sulfate  (FEROSUL) 325 (65 FE) MG tablet [id number]  Take 1 tablet (325 mg total) by mouth daily before breakfast.  Patient not taking: Reported on 07/10/2024   Oley Bascom RAMAN, NP  Active Self, Pharmacy Records  fluconazole  (DIFLUCAN ) 100 MG tablet [id number]  Take 1 tablet (100 mg total) by mouth daily. Bensimhon, Daniel R, MD  Active   folic acid  (FOLVITE ) 800 MCG tablet [id number]  Take 0.5 tablets (400 mcg total) by mouth daily. Oley Bascom RAMAN, NP  Active Self, Pharmacy Records  gabapentin  (NEURONTIN ) 400 MG capsule [id number]  Take 1 capsule (400 mg total) by mouth 3 (three) times daily. Bensimhon, Toribio SAUNDERS, MD  Active Self, Pharmacy Records  glucose blood test strip [id number]  Use as directed 4 times daily. Shannan Sia I, NP  Active Self, Pharmacy Records  isosorbide -hydrALAZINE  (BIDIL ) 20-37.5 MG tablet [id number] Yes Take 2 tablets by mouth 3 (three) times daily. Bensimhon, Toribio SAUNDERS, MD  Active Self, Pharmacy Records  meclizine  (ANTIVERT ) 25 MG tablet [id number]  Take 1 tablet (25 mg total) by mouth 3 (three) times daily as needed for dizziness. Bensimhon, Toribio SAUNDERS, MD  Active Self, Pharmacy Records  ondansetron  (ZOFRAN -ODT) 4 MG disintegrating tablet  [id number]  Take 1 tablet (4 mg total) by mouth every 8 (eight) hours as needed for nausea or vomiting. Zenaida Morene PARAS, MD  Active Self, Pharmacy Records  pantoprazole  (PROTONIX ) 40 MG tablet [id number]  Take 1 tablet (40 mg total) by mouth once daily.  Patient not taking: Reported on 07/10/2024   Bensimhon, Toribio SAUNDERS, MD  Active Self, Pharmacy Records  rosuvastatin  (CRESTOR ) 10 MG tablet [id number]  Take 1 tablet (10 mg total) by mouth daily. Bensimhon, Daniel R, MD  Active Self, Pharmacy Records  sacubitril -valsartan  (ENTRESTO ) 24-26 MG [id number]  Take 1 tablet by mouth 2 (two) times daily.  Patient not taking: Reported on 07/17/2024   BensimhonToribio SAUNDERS, MD  Active   Semaglutide , 2 MG/DOSE, 8 MG/3ML SOPN [id number] Yes Inject 2 mg as directed once a week. Oley Bascom RAMAN, NP  Active Self, Pharmacy Records           Med Note (SATTERFIELD, TEENA FORBES Schaumann Jun 29, 2024  5:31 PM) Take on Sundays   sertraline  (ZOLOFT ) 50 MG tablet [id number]  Take 3 tablets (150 mg total) by mouth daily. Bensimhon, Toribio SAUNDERS, MD  Active   traMADol  HCl 100 MG TABS [id number]  Take 1 tablet (100 mg) by mouth every 6 (six) hours as needed for pain  Patient not taking: Reported on 07/10/2024   Bensimhon, Toribio SAUNDERS, MD  Active Self, Pharmacy Records  traZODone  (DESYREL ) 100 MG tablet [id number]  Take 1 tablet (100 mg total) by mouth at bedtime. Bensimhon, Toribio SAUNDERS, MD  Active Self, Pharmacy Records  warfarin (COUMADIN ) 4 MG tablet [id number]  Take 2 tablets by mouth daily (8 mg total)  or as directed by HF clinic Bensimhon, Toribio SAUNDERS, MD  Active             Recommendation:   Acute PCP follow-up Patient to call and schedule hospital follow-up. Continue Current Plan of Care  Follow Up Plan:   Telephone follow up appointment date/time:  07/31/24 at 2 PM  [name], RN MSN [place]  [facility] RN Care Manager Direct Dial: [phone]  Fax: [phone]

## 2024-07-20 ENCOUNTER — Other Ambulatory Visit (HOSPITAL_COMMUNITY): Payer: Self-pay

## 2024-07-20 DIAGNOSIS — Z7901 Long term (current) use of anticoagulants: Secondary | ICD-10-CM

## 2024-07-20 DIAGNOSIS — Z95811 Presence of heart assist device: Secondary | ICD-10-CM

## 2024-07-21 ENCOUNTER — Ambulatory Visit (HOSPITAL_COMMUNITY): Payer: Self-pay | Admitting: Pharmacist

## 2024-07-21 ENCOUNTER — Telehealth (HOSPITAL_COMMUNITY): Payer: Self-pay | Admitting: Internal Medicine

## 2024-07-21 ENCOUNTER — Other Ambulatory Visit (HOSPITAL_COMMUNITY): Payer: Self-pay | Admitting: *Deleted

## 2024-07-21 ENCOUNTER — Telehealth (HOSPITAL_COMMUNITY): Payer: Self-pay | Admitting: Pharmacy Technician

## 2024-07-21 ENCOUNTER — Other Ambulatory Visit: Payer: Self-pay

## 2024-07-21 ENCOUNTER — Ambulatory Visit (HOSPITAL_COMMUNITY)
Admission: RE | Admit: 2024-07-21 | Discharge: 2024-07-21 | Disposition: A | Discharge status: Home / Self Care | Source: Ambulatory Visit | Attending: Internal Medicine | Admitting: Internal Medicine

## 2024-07-21 VITALS — BP 122/96 | HR 69 | Wt 294.4 lb

## 2024-07-21 DIAGNOSIS — D508 Other iron deficiency anemias: Secondary | ICD-10-CM | POA: Diagnosis not present

## 2024-07-21 DIAGNOSIS — I152 Hypertension secondary to endocrine disorders: Secondary | ICD-10-CM | POA: Diagnosis not present

## 2024-07-21 DIAGNOSIS — F32A Depression, unspecified: Secondary | ICD-10-CM | POA: Diagnosis not present

## 2024-07-21 DIAGNOSIS — F419 Anxiety disorder, unspecified: Secondary | ICD-10-CM | POA: Insufficient documentation

## 2024-07-21 DIAGNOSIS — D631 Anemia in chronic kidney disease: Secondary | ICD-10-CM | POA: Diagnosis not present

## 2024-07-21 DIAGNOSIS — Z79899 Other long term (current) drug therapy: Secondary | ICD-10-CM | POA: Insufficient documentation

## 2024-07-21 DIAGNOSIS — G4733 Obstructive sleep apnea (adult) (pediatric): Secondary | ICD-10-CM | POA: Diagnosis not present

## 2024-07-21 DIAGNOSIS — Z7901 Long term (current) use of anticoagulants: Secondary | ICD-10-CM | POA: Diagnosis not present

## 2024-07-21 DIAGNOSIS — I130 Hypertensive heart and chronic kidney disease with heart failure and stage 1 through stage 4 chronic kidney disease, or unspecified chronic kidney disease: Secondary | ICD-10-CM | POA: Diagnosis not present

## 2024-07-21 DIAGNOSIS — I480 Paroxysmal atrial fibrillation: Secondary | ICD-10-CM | POA: Diagnosis not present

## 2024-07-21 DIAGNOSIS — I428 Other cardiomyopathies: Secondary | ICD-10-CM | POA: Insufficient documentation

## 2024-07-21 DIAGNOSIS — Z905 Acquired absence of kidney: Secondary | ICD-10-CM | POA: Insufficient documentation

## 2024-07-21 DIAGNOSIS — Z7985 Long-term (current) use of injectable non-insulin antidiabetic drugs: Secondary | ICD-10-CM | POA: Insufficient documentation

## 2024-07-21 DIAGNOSIS — E1122 Type 2 diabetes mellitus with diabetic chronic kidney disease: Secondary | ICD-10-CM | POA: Diagnosis not present

## 2024-07-21 DIAGNOSIS — K922 Gastrointestinal hemorrhage, unspecified: Secondary | ICD-10-CM | POA: Insufficient documentation

## 2024-07-21 DIAGNOSIS — I5022 Chronic systolic (congestive) heart failure: Secondary | ICD-10-CM

## 2024-07-21 DIAGNOSIS — Z85528 Personal history of other malignant neoplasm of kidney: Secondary | ICD-10-CM | POA: Insufficient documentation

## 2024-07-21 DIAGNOSIS — Z6841 Body Mass Index (BMI) 40.0 and over, adult: Secondary | ICD-10-CM | POA: Insufficient documentation

## 2024-07-21 DIAGNOSIS — Z95811 Presence of heart assist device: Secondary | ICD-10-CM

## 2024-07-21 DIAGNOSIS — E1159 Type 2 diabetes mellitus with other circulatory complications: Secondary | ICD-10-CM

## 2024-07-21 DIAGNOSIS — N1832 Chronic kidney disease, stage 3b: Secondary | ICD-10-CM | POA: Insufficient documentation

## 2024-07-21 DIAGNOSIS — M109 Gout, unspecified: Secondary | ICD-10-CM | POA: Diagnosis not present

## 2024-07-21 DIAGNOSIS — I10 Essential (primary) hypertension: Secondary | ICD-10-CM

## 2024-07-21 LAB — COMPREHENSIVE METABOLIC PANEL WITH GFR
ALT: 35 U/L (ref 0–44)
AST: 48 U/L — ABNORMAL HIGH (ref 15–41)
Albumin: 3.4 g/dL — ABNORMAL LOW (ref 3.5–5.0)
Alkaline Phosphatase: 57 U/L (ref 38–126)
Anion gap: 10 (ref 5–15)
BUN: 25 mg/dL — ABNORMAL HIGH (ref 6–20)
CO2: 22 mmol/L (ref 22–32)
Calcium: 9.3 mg/dL (ref 8.9–10.3)
Chloride: 107 mmol/L (ref 98–111)
Creatinine, Ser: 1.55 mg/dL — ABNORMAL HIGH (ref 0.61–1.24)
GFR, Estimated: 55 mL/min — ABNORMAL LOW (ref >60)
Glucose, Bld: 122 mg/dL — ABNORMAL HIGH (ref 70–99)
Potassium: 3.9 mmol/L (ref 3.5–5.1)
Sodium: 139 mmol/L (ref 135–145)
Total Bilirubin: 0.2 mg/dL (ref 0.0–1.2)
Total Protein: 6.4 g/dL — ABNORMAL LOW (ref 6.5–8.1)

## 2024-07-21 LAB — PREALBUMIN: Prealbumin: 26 mg/dL (ref 18–38)

## 2024-07-21 LAB — PROTIME-INR
INR: 3.4 — ABNORMAL HIGH (ref 0.8–1.2)
Prothrombin Time: 35.5 s — ABNORMAL HIGH (ref 11.4–15.2)

## 2024-07-21 LAB — FOLATE: Folate: 15.3 ng/mL (ref >5.9)

## 2024-07-21 LAB — IRON AND TIBC
Iron: 15 ug/dL — ABNORMAL LOW (ref 45–182)
Saturation Ratios: 3 % — ABNORMAL LOW (ref 17.9–39.5)
TIBC: 449 ug/dL (ref 250–450)
UIBC: 434 ug/dL

## 2024-07-21 LAB — LACTATE DEHYDROGENASE: LDH: 222 U/L — ABNORMAL HIGH (ref 98–192)

## 2024-07-21 LAB — CBC
HCT: 29.3 % — ABNORMAL LOW (ref 39.0–52.0)
Hemoglobin: 9.1 g/dL — ABNORMAL LOW (ref 13.0–17.0)
MCH: 26.4 pg (ref 26.0–34.0)
MCHC: 31.1 g/dL (ref 30.0–36.0)
MCV: 84.9 fL (ref 80.0–100.0)
Platelets: 368 K/uL (ref 150–400)
RBC: 3.45 MIL/uL — ABNORMAL LOW (ref 4.22–5.81)
RDW: 17.8 % — ABNORMAL HIGH (ref 11.5–15.5)
WBC: 6.1 K/uL (ref 4.0–10.5)
nRBC: 0 % (ref 0.0–0.2)

## 2024-07-21 LAB — FERRITIN: Ferritin: 17 ng/mL — ABNORMAL LOW (ref 24–336)

## 2024-07-21 LAB — VITAMIN B12: Vitamin B-12: 259 pg/mL (ref 180–914)

## 2024-07-21 LAB — TSH: TSH: 3.297 u[IU]/mL (ref 0.350–4.500)

## 2024-07-21 LAB — MAGNESIUM: Magnesium: 1.7 mg/dL (ref 1.7–2.4)

## 2024-07-21 MED ORDER — SACUBITRIL-VALSARTAN 97-103 MG PO TABS
1.0000 | ORAL_TABLET | Freq: Two times a day (BID) | ORAL | 6 refills | Status: AC
  Filled 2024-07-21 – 2024-08-02 (×2): qty 60, 30d supply, fill #0
  Filled 2024-09-07: qty 60, 30d supply, fill #1
  Filled 2024-10-09: qty 60, 30d supply, fill #2
  Filled 2024-11-10: qty 60, 30d supply, fill #3

## 2024-07-21 NOTE — Progress Notes (Addendum)
 Patient presents for hosp d/c w/ 3 year Intermacs and annual maintenance in VAD clinic with his cousin [name]. Reports no problems with VAD drive line or equipment.   States he has been feeling good since hospital admission. Denies lightheadedness, dizziness, shortness of breath, falls, and signs of bleeding.   BP slightly elevated today. Per Dr Bensimhon increase Entresto  to 97-103 mg BID. Updated prescription sent to pt's pharmacy. Pt verbalized understanding of medication change.   Provided with 2 sets of new batteries today.   Referral sent to infusion therapy per anemia panel results today. Pt aware referral sent.   Vital Signs:          Doppler Pressure: 120 Automatic BP: 122/96 (105)     HR: 69         SPO2: 97% on RA   Weight: 294.4 lb w/eqt Last weight:  294.8  lbs w/eqt   VAD Indication: Destination Therapy due to smoking and BMI   VAD interrogation & Equipment Management: Speed:  6300 Flow:  4.8 Power: 5.1 w PI:  3.3 Hct: 30   Alarms: few low voltage advisories Events: 10 - 20   Fixed speed 6300 Low speed limit: 6000   Primary Controller: Replace back up battery in 19 mths Back up controller: Replace back up battery in 30 mths   Annual Equipment Maintenance on UBC/PM was performed on 07/21/24.    I reviewed the LVAD parameters from today and compared the results to the patient's prior recorded data. LVAD interrogation was NEGATIVE for significant power changes, NEGATIVE for clinical alarms and STABLE for PI events/speed drops. No programming changes were made and pump is functioning within specified parameters. Pt is performing daily controller and system monitor self tests along with completing weekly and monthly maintenance for LVAD equipment.   LVAD equipment check completed and is in good working order. Back-up equipment present.   Exit Site Care: Drive line being maintained weekly by pt's wife. Existing dressing CDI. Pt has adequate dressing supplies at  home.   Device:  Left subcutaneous dual ICD Therapies: on at 250 BPM Last check: 05/31/24  BP & Labs:  Doppler 120 - Doppler is reflecting modified systolic   Hgb 9.1  - No S/S of bleeding. Specifically denies melena/BRBPR or nosebleeds.   LDH 222 with established baseline of 160 - 300. Denies tea-colored urine. No power elevations noted on interrogation.    Batteries Manufacture Date: Number of uses: Re-calibration  07/16/21 195-295 Performed by patient   Annual maintenance completed per [employer] on patient's home MPU and universal Magazine features editor. Provided with 2 new sets of batteries today.   Backup system controller 11 volt battery charged during visit.   3 year Intermacs follow up completed including:  Quality of Life, KCCQ-12, and Neurocognitive trail making.   Pt completed 1200 feet during 6 minute walk.  Patient Goals: to be around for my children and grandkids  [place] Cardiomyopathy Questionnaire     07/21/2024    1:21 PM 01/06/2024   12:44 PM 06/17/2023    3:08 PM  KCCQ-12  1 a. Ability to shower/bathe Moderately limited Moderately limited Slightly limited  1 b. Ability to walk 1 block Not at all limited Moderately limited Slightly limited  1 c. Ability to hurry/jog Moderately limited Quite a bit limited Quite a bit limited  2. Edema feet/ankles/legs Less than once a week 1-2 times a week Never over the past 2 weeks  3. Limited by fatigue Less than once a  week 3+ times per week, not every day 1-2 times a week  4. Limited by dyspnea Less than once a week 3+ times a week, not every day 1-2 times a week  5. Sitting up / on 3+ pillows Never over the past 2 weeks Less than once a week Never over the past 2 weeks  6. Limited enjoyment of life Not limited at all Limited quite a bit Slightly limited  7. Rest of life w/ symptoms Completely satisfied Mostly satisfied Completely satisfied  8 a. Participation in hobbies Slightly limited Slightly limited Slightly limited  8 b.  Participation in chores Slightly limited Did not limit at all Moderately limited  8 c. Visiting family/friends Slightly limited Did not limit at all Slightly limited    Plan:  Increase Entresto  to 97-103 twice daily Coumadin  per Jaun Bash PharmD Return to clinic in 2 months for follow up  Isaiah Knoll RN VAD Coordinator  Office: [phone]  24/7 Pager: [phone]

## 2024-07-21 NOTE — Progress Notes (Addendum)
 VAD CLINIC Follow-up Visit  PCP: Oley Bascom RAMAN, NP HF MD: DB   HPI:  Jeffrey [name] is a 48 y.o. yo male with a history of  poorly controlled HTN, R renal cell carcinoma s/p nephrectomy, CKD IIIa, DM2, OSA, gout, morbid obesity and systolic HF due to NICM. 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 D/c'd home with plan for 6 weeks IV Ancef  with plan to transition to cefadroxil  for long-term suppression on 09/28/22.   Admitted 10/25 with GI bleed hgb 4.6. Has had 8 units PRBCs. Hgb improved to 8.6 today. 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 HTN meds decreased in hospital  Here for post-hospital f/u. Feels good. No bleeding. Weight stable. Complaint with meds. Denies orthopnea or PND. No fevers, chills or problems with driveline. No bleeding, melena or neuro symptoms. No VAD alarms. Taking all meds as prescribed.    LVAD Documentation    07/21/2024  Device Info  LVAD Type: Heartmate III  Date of Implant: 07/21/2021  Therapy Type: Destination Therapy      07/21/2024  Vitals  Heart Rate: 69 BPM  Automatic BP: 122/96  Doppler MAP: 120 mmHg  SpO2: 97 %    Last 3 Weights Weight Weight  07/21/2024 133.539 kg 294 lb 6.4 oz  07/17/2024 127.461 kg 281 lb  07/10/2024 127.461 kg 281 lb       07/21/2024  LVAD Paramaters  Speed: 6300 RPM  Flow: 5 LPM  PI: 3  Power: 5 Watts  Hematocrit: 30 %  Alarms: Few low voltage advisories  Events: 10-20  Last Speed Change Date: 05/02/2024  Last Ramp Echo Date: 05/02/2024  Last Right Heart Cath Date: 07/18/2021  Bleeding History: Yes  Type of Bleeding Hx: GI Bleed  Type of Dressing: Weekly  Annual Maintenance Date: 07/21/2024    Labs    Units 07/21/24 0854 07/14/24 1029 07/08/24 0202 07/07/24 0236 07/06/24 0309  INR   --  2.5* 1.6* 1.3* 1.2  LDH U/L  --   --  143 173 150   HGB g/dL 9.1* 9.0* 8.9*  --  9.6*  CREATININE mg/dL  --   --  8.44* 8.44* 8.41*          Past Medical History:  Diagnosis Date   Anxiety    Aspirin  allergy    Childhood asthma    Chronic systolic CHF (congestive heart failure) (HCC)    a. EF 20-25% in 2012. b. EF 45-50% in 10/2011 with nonischemic nuc - presumed NICM. c. 12/2014 Echo: Sev depressed LV fxn, sev dil LV, mild LVH, mild MR, sev dil LA, mildly reduced RV fxn.   CKD (chronic kidney disease) stage 2, GFR 60-89 ml/min    Depression    H/O vasectomy 12/2019   High cholesterol    Hypertension    Morbid obesity (HCC)    Nephrolithiasis    OSA on CPAP    Paroxysmal atrial fibrillation (HCC)    Presumed NICM    a. 04/2014 Myoview : EF 26%, glob HK, sev glob HK, ? prior infarct;  b. Never cathed 2/2 CKD.   Renal cell carcinoma (HCC)    a. s/p Rt robotic assisted partial converted to radical nephrectomy on 01/2013.   Troponin level elevated    a. 04/2014, 12/2014: felt due to CHF.   Type II diabetes mellitus (HCC)  Ventricular tachycardia (HCC)    a. appropriate ICD therapy 12/2017    Current Outpatient Medications  Medication Sig Dispense Refill   acetaminophen  (TYLENOL ) 325 MG tablet Take 2 tablets (650 mg total) by mouth every 6 (six) hours as needed for fever.     allopurinol  (ZYLOPRIM ) 100 MG tablet Take 1 tablet (100 mg total) by mouth daily. 90 tablet 3   amiodarone  (PACERONE ) 200 MG tablet Take 1 tablet (200 mg total) by mouth daily. 30 tablet 11   Blood Pressure KIT 1 Units by Does not apply route daily. 1 kit 0   cefadroxil  (DURICEF) 500 MG capsule Take 1 capsule (500 mg total) by mouth 2 (two) times daily. 180 capsule 3   colchicine  0.6 MG tablet Take 1 tablet (0.6 mg total) by mouth once daily. 30 tablet 6   cyanocobalamin  (VITAMIN B12) 1000 MCG tablet Take 1 tablet (1,000 mcg total) by mouth daily. 30 tablet 0   cyclobenzaprine  (FLEXERIL ) 5 MG tablet Take 1 tablet (5 mg total) by mouth 3 (three) times daily as  needed for muscle spasms 15 tablet 0   ferrous sulfate  (FEROSUL) 325 (65 FE) MG tablet Take 1 tablet (325 mg total) by mouth daily before breakfast. 90 tablet 1   fluconazole  (DIFLUCAN ) 100 MG tablet Take 1 tablet (100 mg total) by mouth daily. 14 tablet 0   folic acid  (FOLVITE ) 800 MCG tablet Take 0.5 tablets (400 mcg total) by mouth daily. 30 tablet 2   gabapentin  (NEURONTIN ) 400 MG capsule Take 1 capsule (400 mg total) by mouth 3 (three) times daily. 30 capsule 6   glucose blood test strip Use as directed 4 times daily. 100 each 11   isosorbide -hydrALAZINE  (BIDIL ) 20-37.5 MG tablet Take 2 tablets by mouth 3 (three) times daily. 180 tablet 6   meclizine  (ANTIVERT ) 25 MG tablet Take 1 tablet (25 mg total) by mouth 3 (three) times daily as needed for dizziness. 90 tablet 2   ondansetron  (ZOFRAN -ODT) 4 MG disintegrating tablet Take 1 tablet (4 mg total) by mouth every 8 (eight) hours as needed for nausea or vomiting. 5 tablet 0   pantoprazole  (PROTONIX ) 40 MG tablet Take 1 tablet (40 mg total) by mouth once daily. 90 tablet 3   rosuvastatin  (CRESTOR ) 10 MG tablet Take 1 tablet (10 mg total) by mouth daily. 90 tablet 3   sacubitril -valsartan  (ENTRESTO ) 24-26 MG Take 1 tablet by mouth 2 (two) times daily. 60 tablet 11   Semaglutide , 2 MG/DOSE, 8 MG/3ML SOPN Inject 2 mg as directed once a week. 3 mL 5   sertraline  (ZOLOFT ) 50 MG tablet Take 3 tablets (150 mg total) by mouth daily. 90 tablet 11   traZODone  (DESYREL ) 100 MG tablet Take 1 tablet (100 mg total) by mouth at bedtime. 30 tablet 11   warfarin (COUMADIN ) 4 MG tablet Take 2 tablets by mouth daily (8 mg total)  or as directed by HF clinic 90 tablet 11   traMADol  HCl 100 MG TABS Take 1 tablet (100 mg) by mouth every 6 (six) hours as needed for pain (Patient not taking: Reported on 07/21/2024) 90 tablet 0   No current facility-administered medications for this encounter.    Aspirin , Bee venom, Lisinopril , and Tomato  REVIEW OF SYSTEMS: All  systems negative except as listed in HPI, PMH and Problem list.   Vitals:   07/21/24 0903 07/21/24 0908  BP: (!) 120/0 (!) 122/96  Pulse: 69   SpO2: 97%   Weight: 133.5 kg (294 lb  6.4 oz)       Exam: General:  NAD.  HEENT: normal  Neck: supple. JVP not elevated.  Carotids 2+ bilat; no bruits. No lymphadenopathy or thryomegaly appreciated. Cor: LVAD hum.  Lungs: Clear. Abdomen: obese soft, nontender, non-distended. No hepatosplenomegaly. No bruits or masses. Good bowel sounds. Driveline site clean. Anchor in place.  Extremities: no cyanosis, clubbing, rash. Warm no edema  Neuro: alert & oriented x 3. No focal deficits. Moves all 4 without problem    ASSESSMENT AND PLAN:  .1. Chronic systolic CHF - Nonischemic CM, end-stage - EF 2012; 20-25%  - Echo  8/22: EF < 20%, severe LV dilation, moderate RV dysfunction.   - RHC (8/22): well compensated hemodynamics. - s/p S-ICD - S/p HM3 LVAD 10/17 (DT) - Doing well. NYHA I. - Volume ok  - Continue Bidil  2 tabs tid - Continue spironolactone  25 mg daily  - Increase entresto  24/26 back to 97/130 bid - Aware of need to get to BMI ~35 to permit transplant  Body mass index is 46.11 kg/m. Now back on OZempic . Weight unchanged  2. VAD  - s/p HM-3 placement 07/21/21  - VAD interrogated personally. Parameters stable. - INR pending Goal INR 2.0-3.0 Discussed warfarin dosing with PharmD personally. - LDH pending - DL site looks good  Continue cefadroxil  for suppression - Hgb stable at 9.1 (was 9.0 on d/c). If not improved at next visit check iron stores  3. DL site infection - Resolved  - Continue cefadroxil  for suppression  4 Atrial fibrillation: Paroxysmal - Had severe AF with RVR 7/22 s/p DC-CV x 2 - Seen by EP. Not a candidate for ablation due to body habitus  (could consider AVN ablation and CRT, if needed)  - Had post-op AF with RVR post VAD - 10/23 S/P DC-CV with restoration of NSR. - Remains in NSR on amio - On  warfarin  5. HTN - MAPs elevated. Increase entresto   6. CKD stage 3B  - Has solitary kidney.   - Baseline creatinine is 1.5-1.8.  - labs today   7. OSA - Continue CPAP   8. DM2  - On Jardiance . - Hgb A1c 5.5 on 01/05/24 Personally reviewed - PCP following - Continue Crestor  10  9. Polyarticular gout. - quiescent  10. Depression/anxiety - Continue Zoloft  150mg  daily - Much improved  11. Morbid obesity - Body mass index is 46.11 kg/m. - Continue Ozempic .  - Aware of need for weight loss as above  12. GI bleed 10/25 - Admitted 10/25 with GI bleed hgb 4.6. 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  - Hgb stable today  I spent a total of 40 minutes today: 1) reviewing the patient's medical records including previous charts, labs and recent notes from other providers; 2) examining the patient and counseling them on their medical issues/explaining the plan of care; 3) adjusting meds as needed and 4) ordering lab work or other needed tests.   Toribio Fuel, MD  9:20 AM

## 2024-07-21 NOTE — Addendum Note (Signed)
 Encounter addended by: Berdine Isaiah NOVAK, RN on: 07/21/2024 1:32 PM  Actions taken: Flowsheet accepted, Visit diagnoses modified, Order list changed, Diagnosis association updated, Clinical Note Signed, Charge Capture section accepted

## 2024-07-21 NOTE — Telephone Encounter (Signed)
 Patient referred to infusion pharmacy team for ambulatory infusion of IV iron.  Insurance - [place] Medicaid Prepaid Site of care - Site of care: [facility] Dx code - Z95.811, D50.8 IV Iron Therapy - Feraheme  510 mg x 2  Infusion appointments - Scheduling team will schedule patient as soon as possible.   Thank you,  Norton Blush, PharmD, BCSCP Pharmacist [facility]

## 2024-07-21 NOTE — Telephone Encounter (Signed)
 Auth Submission: NO AUTH NEEDED Site of care: [facility] Payer: [employer] [place] MEDICAID Medication & CPT/J Code(s) submitted: Feraheme  (ferumoxytol ) [id number] Diagnosis Code: D50.8, Z95.811 Route of submission (phone, fax, portal):  Phone # Fax # Auth type: Buy/Bill HB Units/visits requested: 510MG  X 2 DOSES Reference number:  Approval from: 07/21/2024 to 10/04/24    Dagoberto [name], CPhT [facility] Phone: [phone] 07/21/2024

## 2024-07-21 NOTE — Patient Instructions (Signed)
 Increase Entresto  to 97-103 twice daily Coumadin  per Jaun Bash PharmD Return to clinic in 2 months for follow up

## 2024-07-22 LAB — T3, FREE: T3, Free: 2.6 pg/mL (ref 2.0–4.4)

## 2024-07-22 LAB — T4: T4, Total: 5.5 ug/dL (ref 4.5–12.0)

## 2024-07-24 ENCOUNTER — Telehealth

## 2024-07-27 ENCOUNTER — Ambulatory Visit (HOSPITAL_COMMUNITY)

## 2024-07-28 ENCOUNTER — Ambulatory Visit (HOSPITAL_COMMUNITY)
Admission: RE | Admit: 2024-07-28 | Discharge: 2024-07-28 | Disposition: A | Discharge status: Home / Self Care | Source: Ambulatory Visit | Attending: Cardiology | Admitting: Cardiology

## 2024-07-28 DIAGNOSIS — Z7901 Long term (current) use of anticoagulants: Secondary | ICD-10-CM | POA: Diagnosis not present

## 2024-07-28 DIAGNOSIS — Z95811 Presence of heart assist device: Secondary | ICD-10-CM | POA: Insufficient documentation

## 2024-07-28 LAB — PROTIME-INR
INR: 2.5 — ABNORMAL HIGH (ref 0.8–1.2)
Prothrombin Time: 28.4 s — ABNORMAL HIGH (ref 11.4–15.2)

## 2024-07-31 ENCOUNTER — Other Ambulatory Visit (HOSPITAL_COMMUNITY): Payer: Self-pay | Admitting: *Deleted

## 2024-07-31 ENCOUNTER — Other Ambulatory Visit: Payer: Self-pay

## 2024-07-31 ENCOUNTER — Ambulatory Visit (HOSPITAL_COMMUNITY)

## 2024-07-31 ENCOUNTER — Ambulatory Visit (HOSPITAL_COMMUNITY): Payer: Self-pay | Admitting: Pharmacist

## 2024-07-31 DIAGNOSIS — Z95811 Presence of heart assist device: Secondary | ICD-10-CM

## 2024-07-31 DIAGNOSIS — Z7901 Long term (current) use of anticoagulants: Secondary | ICD-10-CM

## 2024-07-31 NOTE — Patient Outreach (Signed)
 Complex Care Management   Visit Note  07/31/2024  Name:  Jeffrey [name] MRN: [id number] DOB: 1976/08/31  Situation: Referral received for Complex Care Management related to Heart Failure and recent hospitalization related to GI bleed I obtained verbal consent from Patient.  Visit completed with Patient  on the phone  Background:   Past Medical History:  Diagnosis Date   Anxiety    Aspirin  allergy    Childhood asthma    Chronic systolic CHF (congestive heart failure) (HCC)    a. EF 20-25% in 2012. b. EF 45-50% in 10/2011 with nonischemic nuc - presumed NICM. c. 12/2014 Echo: Sev depressed LV fxn, sev dil LV, mild LVH, mild MR, sev dil LA, mildly reduced RV fxn.   CKD (chronic kidney disease) stage 2, GFR 60-89 ml/min    Depression    H/O vasectomy 12/2019   High cholesterol    Hypertension    Morbid obesity (HCC)    Nephrolithiasis    OSA on CPAP    Paroxysmal atrial fibrillation (HCC)    Presumed NICM    a. 04/2014 Myoview : EF 26%, glob HK, sev glob HK, ? prior infarct;  b. Never cathed 2/2 CKD.   Renal cell carcinoma (HCC)    a. s/p Rt robotic assisted partial converted to radical nephrectomy on 01/2013.   Troponin level elevated    a. 04/2014, 12/2014: felt due to CHF.   Type II diabetes mellitus (HCC)    Ventricular tachycardia (HCC)    a. appropriate ICD therapy 12/2017    Assessment: Patient Reported Symptoms:  Cognitive Cognitive Status: Able to follow simple commands, Alert and oriented to person, place, and time, Normal speech and language skills Cognitive/Intellectual Conditions Management [RPT]: None reported or documented in medical history or problem list   Health Maintenance Behaviors: Annual physical exam, Healthy diet Health Facilitated by: Healthy diet  Neurological Neurological Review of Symptoms: No symptoms reported    HEENT HEENT Symptoms Reported: No symptoms reported      Cardiovascular Cardiovascular Symptoms Reported: No symptoms reported Does patient  have uncontrolled Hypertension?: Yes Is patient checking Blood Pressure at home?: Yes Patient's Recent BP reading at home: 105/89 Cardiovascular Management Strategies: Medical device, Medication therapy, Weight management Do You Have a Working Readable Scale?: Yes Weight: 284 lb (128.8 kg) (Patient reported) Cardiovascular Comment: Note per chart review BP was elevated cardiology visit so Entresto  was increased. Patient confirms he has made dose change  Respiratory Respiratory Symptoms Reported: No symptoms reported    Endocrine Endocrine Symptoms Reported: Not assessed    Gastrointestinal Gastrointestinal Symptoms Reported: No symptoms reported Additional Gastrointestinal Details: Patient denies blood in his stool. Note per chart review that he is going to be getting ambulatory iron infusions for iron level of 15 on lab work. Patient denies symptoms related to low iron levels. He reports he has not heard from infusion center to schedule appointment Per referral notes, it appears infusion center tried to call but was unable to leave a voicemail. Provided patient with infusion center phone number and advised to call to schedule appointment, [phone]. Gastrointestinal Management Strategies: Medication therapy    Genitourinary Genitourinary Symptoms Reported: Not assessed    Integumentary Integumentary Symptoms Reported: Not assessed    Musculoskeletal Musculoskelatal Symptoms Reviewed: Not assessed        Psychosocial Psychosocial Symptoms Reported: Not assessed          07/31/2024    PHQ2-9 Depression Screening   Little interest or pleasure in doing things  Feeling down, depressed, or hopeless    PHQ-2 - Total Score    Trouble falling or staying asleep, or sleeping too much    Feeling tired or having little energy    Poor appetite or overeating     Feeling bad about yourself - or that you are a failure or have let yourself or your family down    Trouble concentrating on  things, such as reading the newspaper or watching television    Moving or speaking so slowly that other people could have noticed.  Or the opposite - being so fidgety or restless that you have been moving around a lot more than usual    Thoughts that you would be better off dead, or hurting yourself in some way    PHQ2-9 Total Score    If you checked off any problems, how difficult have these problems made it for you to do your work, take care of things at home, or get along with other people    Depression Interventions/Treatment      There were no vitals filed for this visit.  Medications Reviewed Today     Reviewed by Arno Rosaline SQUIBB, RN (Registered Nurse) on 07/31/24 at 1410  Med List Status: <None>   Medication Order Taking? Sig Documenting Provider Last Dose Status Informant  acetaminophen  (TYLENOL ) 325 MG tablet [id number]  Take 2 tablets (650 mg total) by mouth every 6 (six) hours as needed for fever. Lenetta Greig BIRCH, NP  Active Self, Pharmacy Records           Med Note (SATTERFIELD, TEENA BRAVO   Thu Jun 29, 2024  5:35 PM)    allopurinol  (ZYLOPRIM ) 100 MG tablet [id number]  Take 1 tablet (100 mg total) by mouth daily. Bensimhon, Toribio SAUNDERS, MD  Active Self, Pharmacy Records  amiodarone  (PACERONE ) 200 MG tablet [id number]  Take 1 tablet (200 mg total) by mouth daily. Bensimhon, Toribio SAUNDERS, MD  Active Self, Pharmacy Records  Blood Pressure KIT [id number]  1 Units by Does not apply route daily. Oley Bascom RAMAN, NP  Active Self, Pharmacy Records  cefadroxil  (DURICEF) 500 MG capsule [id number]  Take 1 capsule (500 mg total) by mouth 2 (two) times daily. Bensimhon, Toribio SAUNDERS, MD  Active Self, Pharmacy Records  colchicine  0.6 MG tablet [id number]  Take 1 tablet (0.6 mg total) by mouth once daily. Bensimhon, Toribio SAUNDERS, MD  Active Self, Pharmacy Records  cyanocobalamin  (VITAMIN B12) 1000 MCG tablet [id number]  Take 1 tablet (1,000 mcg total) by mouth daily. Oley Bascom RAMAN, NP  Active Self, Pharmacy  Records  cyclobenzaprine  (FLEXERIL ) 5 MG tablet [id number]  Take 1 tablet (5 mg total) by mouth 3 (three) times daily as needed for muscle spasms   Active Self, Pharmacy Records  ferrous sulfate  (FEROSUL) 325 (65 FE) MG tablet [id number]  Take 1 tablet (325 mg total) by mouth daily before breakfast. Oley Bascom RAMAN, NP  Active Self, Pharmacy Records  fluconazole  (DIFLUCAN ) 100 MG tablet [id number]  Take 1 tablet (100 mg total) by mouth daily. Bensimhon, Daniel R, MD  Active   folic acid  (FOLVITE ) 800 MCG tablet [id number]  Take 0.5 tablets (400 mcg total) by mouth daily. Oley Bascom RAMAN, NP  Active Self, Pharmacy Records  gabapentin  (NEURONTIN ) 400 MG capsule [id number]  Take 1 capsule (400 mg total) by mouth 3 (three) times daily. Bensimhon, Toribio SAUNDERS, MD  Active Self, Pharmacy Records  glucose blood test strip [id number]  Use as directed 4 times daily. Shannan Sia  I, NP  Active Self, Pharmacy Records  isosorbide -hydrALAZINE  (BIDIL ) 20-37.5 MG tablet [id number]  Take 2 tablets by mouth 3 (three) times daily. Bensimhon, Toribio SAUNDERS, MD  Active Self, Pharmacy Records  meclizine  (ANTIVERT ) 25 MG tablet [id number]  Take 1 tablet (25 mg total) by mouth 3 (three) times daily as needed for dizziness. Bensimhon, Toribio SAUNDERS, MD  Active Self, Pharmacy Records  ondansetron  (ZOFRAN -ODT) 4 MG disintegrating tablet [id number]  Take 1 tablet (4 mg total) by mouth every 8 (eight) hours as needed for nausea or vomiting. Zenaida Morene PARAS, MD  Active Self, Pharmacy Records  pantoprazole  (PROTONIX ) 40 MG tablet [id number]  Take 1 tablet (40 mg total) by mouth once daily. Bensimhon, Toribio SAUNDERS, MD  Active Self, Pharmacy Records  rosuvastatin  (CRESTOR ) 10 MG tablet [id number]  Take 1 tablet (10 mg total) by mouth daily. Bensimhon, Toribio SAUNDERS, MD  Active Self, Pharmacy Records  sacubitril -valsartan  (ENTRESTO ) 97-103 MG [id number]  Take 1 tablet by mouth 2 (two) times daily. Bensimhon, Toribio SAUNDERS, MD  Active   Semaglutide , 2 MG/DOSE, 8  MG/3ML SOPN [id number]  Inject 2 mg as directed once a week. Oley Bascom RAMAN, NP  Active Self, Pharmacy Records           Med Note (SATTERFIELD, TEENA FORBES Schaumann Jun 29, 2024  5:31 PM) Take on Sundays   sertraline  (ZOLOFT ) 50 MG tablet [id number]  Take 3 tablets (150 mg total) by mouth daily. Bensimhon, Toribio SAUNDERS, MD  Active   traMADol  HCl 100 MG TABS [id number]  Take 1 tablet (100 mg) by mouth every 6 (six) hours as needed for pain  Patient not taking: Reported on 07/21/2024   Bensimhon, Toribio SAUNDERS, MD  Active Self, Pharmacy Records  traZODone  (DESYREL ) 100 MG tablet [id number]  Take 1 tablet (100 mg total) by mouth at bedtime. Bensimhon, Toribio SAUNDERS, MD  Active Self, Pharmacy Records  warfarin (COUMADIN ) 4 MG tablet [id number]  Take 2 tablets by mouth daily (8 mg total)  or as directed by HF clinic Bensimhon, Toribio SAUNDERS, MD  Active             Recommendation:   Specialty provider follow-up Patient will call infusion center to schedule iron infusions as referred by cardiology Continue Current Plan of Care  Follow Up Plan:   Telephone follow up appointment date/time:  08/28/24 at 2 PM  [name], RN MSN [place]  [facility] RN Care Manager Direct Dial: [phone]  Fax: [phone]

## 2024-07-31 NOTE — Patient Instructions (Signed)
 Visit Information  [name] was given information about Medicaid Managed Care team care coordination services as a part of their Healthy [facility] Medicaid benefit. Jeffrey [name]   If you would like to schedule transportation through your Healthy [facility] plan, please call the following number at least 2 days in advance of your appointment: [phone]  For information about your ride after you set it up, call Ride Assist at [phone]. Use this number to activate a Will Call pickup, or if your transportation is late for a scheduled pickup. Use this number, too, if you need to make a change or cancel a previously scheduled reservation.  If you need transportation services right away, call [phone]. The after-hours call center is staffed 24 hours to handle ride assistance and urgent reservation requests (including discharges) 365 days a year. Urgent trips include sick visits, hospital discharge requests and life-sustaining treatment.  Call the [facility] Line at [phone], at any time, 24 hours a day, 7 days a week. If you are in danger or need immediate medical attention call 911.   Care plan and visit instructions communicated with the patient verbally today. Patient agrees to receive a copy in MyChart. Active MyChart status and patient understanding of how to access instructions and care plan via MyChart confirmed with patient.     Patient                                              will call infusion center at [phone] to schedule iron infusions. Telephone follow up appointment with Managed Medicaid care management team member scheduled for: 08/28/24 at 2 PM  [name], RN MSN [place]  [facility] Population Health RN Care Manager Direct Dial: [phone]  Fax: [phone]   Following is a copy of your plan of care:  There are no care plans that you recently modified to display for this patient.

## 2024-08-01 ENCOUNTER — Other Ambulatory Visit: Payer: Self-pay

## 2024-08-02 ENCOUNTER — Other Ambulatory Visit (HOSPITAL_COMMUNITY): Payer: Self-pay | Admitting: Internal Medicine

## 2024-08-02 ENCOUNTER — Encounter (HOSPITAL_COMMUNITY): Payer: Self-pay | Admitting: Internal Medicine

## 2024-08-02 ENCOUNTER — Other Ambulatory Visit: Payer: Self-pay

## 2024-08-04 ENCOUNTER — Ambulatory Visit (HOSPITAL_COMMUNITY): Payer: Self-pay | Admitting: Cardiology

## 2024-08-04 ENCOUNTER — Ambulatory Visit (HOSPITAL_COMMUNITY)
Admission: RE | Admit: 2024-08-04 | Discharge: 2024-08-04 | Disposition: A | Discharge status: Home / Self Care | Source: Ambulatory Visit | Attending: Cardiology | Admitting: Cardiology

## 2024-08-04 ENCOUNTER — Other Ambulatory Visit: Payer: Self-pay

## 2024-08-04 ENCOUNTER — Ambulatory Visit (HOSPITAL_COMMUNITY): Payer: Self-pay | Admitting: Pharmacist

## 2024-08-04 ENCOUNTER — Ambulatory Visit (HOSPITAL_COMMUNITY)

## 2024-08-04 DIAGNOSIS — Z7901 Long term (current) use of anticoagulants: Secondary | ICD-10-CM | POA: Insufficient documentation

## 2024-08-04 DIAGNOSIS — Z95811 Presence of heart assist device: Secondary | ICD-10-CM | POA: Insufficient documentation

## 2024-08-04 LAB — PROTIME-INR
INR: 1.2 (ref 0.8–1.2)
Prothrombin Time: 15.6 s — ABNORMAL HIGH (ref 11.4–15.2)

## 2024-08-07 ENCOUNTER — Other Ambulatory Visit (HOSPITAL_COMMUNITY): Payer: Self-pay | Admitting: Unknown Physician Specialty

## 2024-08-07 ENCOUNTER — Other Ambulatory Visit (HOSPITAL_COMMUNITY): Payer: Self-pay | Admitting: *Deleted

## 2024-08-07 ENCOUNTER — Other Ambulatory Visit: Payer: Self-pay

## 2024-08-07 ENCOUNTER — Encounter: Payer: Self-pay | Admitting: Licensed Clinical Social Worker

## 2024-08-07 ENCOUNTER — Telehealth: Payer: Self-pay | Admitting: Licensed Clinical Social Worker

## 2024-08-07 DIAGNOSIS — Z7901 Long term (current) use of anticoagulants: Secondary | ICD-10-CM

## 2024-08-07 DIAGNOSIS — I5022 Chronic systolic (congestive) heart failure: Secondary | ICD-10-CM

## 2024-08-07 DIAGNOSIS — Z95811 Presence of heart assist device: Secondary | ICD-10-CM

## 2024-08-07 DIAGNOSIS — T7840XA Allergy, unspecified, initial encounter: Secondary | ICD-10-CM | POA: Diagnosis not present

## 2024-08-07 DIAGNOSIS — I480 Paroxysmal atrial fibrillation: Secondary | ICD-10-CM

## 2024-08-07 DIAGNOSIS — T63441A Toxic effect of venom of bees, accidental (unintentional), initial encounter: Secondary | ICD-10-CM | POA: Diagnosis not present

## 2024-08-07 MED ORDER — AMIODARONE HCL 200 MG PO TABS
200.0000 mg | ORAL_TABLET | Freq: Every day | ORAL | 3 refills | Status: AC
  Filled 2024-08-07: qty 90, 90d supply, fill #0
  Filled 2024-11-10: qty 90, 90d supply, fill #1

## 2024-08-07 MED ORDER — EPINEPHRINE 0.3 MG/0.3ML IJ SOAJ
0.3000 mg | INTRAMUSCULAR | 0 refills | Status: AC
  Filled 2024-08-07: qty 2, 30d supply, fill #0

## 2024-08-07 NOTE — Patient Instructions (Signed)
 Jeffrey [name] - I am sorry I was unable to reach you today for our scheduled appointment. I work with Oley Bascom RAMAN, NP and am calling to support your healthcare needs. Please contact me at [phone] at your earliest convenience. I look forward to speaking with you soon.   Thank you,  Rolin Kerns, LCSW [place]  [facility], [facility] Clinical Social Worker Direct Dial: [phone]  Fax: [phone] Website: [email or web address] 11:18 AM

## 2024-08-09 ENCOUNTER — Other Ambulatory Visit: Payer: Self-pay

## 2024-08-09 ENCOUNTER — Ambulatory Visit (HOSPITAL_COMMUNITY)
Admission: RE | Admit: 2024-08-09 | Discharge: 2024-08-09 | Disposition: A | Discharge status: Home / Self Care | Source: Ambulatory Visit | Attending: Cardiology | Admitting: Cardiology

## 2024-08-09 ENCOUNTER — Telehealth: Payer: Self-pay

## 2024-08-09 ENCOUNTER — Ambulatory Visit (HOSPITAL_COMMUNITY): Payer: Self-pay | Admitting: Pharmacist

## 2024-08-09 DIAGNOSIS — Z95811 Presence of heart assist device: Secondary | ICD-10-CM | POA: Diagnosis not present

## 2024-08-09 DIAGNOSIS — Z7901 Long term (current) use of anticoagulants: Secondary | ICD-10-CM | POA: Insufficient documentation

## 2024-08-09 DIAGNOSIS — E1159 Type 2 diabetes mellitus with other circulatory complications: Secondary | ICD-10-CM

## 2024-08-09 DIAGNOSIS — I152 Hypertension secondary to endocrine disorders: Secondary | ICD-10-CM

## 2024-08-09 DIAGNOSIS — Z4509 Encounter for adjustment and management of other cardiac device: Secondary | ICD-10-CM | POA: Insufficient documentation

## 2024-08-09 DIAGNOSIS — I5022 Chronic systolic (congestive) heart failure: Secondary | ICD-10-CM

## 2024-08-09 LAB — POCT INR: INR: 2.7 (ref 2.0–3.0)

## 2024-08-09 LAB — PROTIME-INR
INR: 2.7 — ABNORMAL HIGH (ref 0.8–1.2)
Prothrombin Time: 29.7 s — ABNORMAL HIGH (ref 11.4–15.2)

## 2024-08-09 MED ORDER — GABAPENTIN 400 MG PO CAPS
400.0000 mg | ORAL_CAPSULE | Freq: Three times a day (TID) | ORAL | 5 refills | Status: AC
  Filled 2024-08-09 – 2024-08-25 (×3): qty 90, 30d supply, fill #0
  Filled 2024-10-09: qty 90, 30d supply, fill #1
  Filled 2024-11-10: qty 90, 30d supply, fill #2

## 2024-08-09 NOTE — Progress Notes (Unsigned)
 Complex Care Management Care Guide Note  08/09/2024 Name: [name] MRN: [id number] DOB: 1975-10-14  Jeffrey [name] is a 48 y.o. year old male who is a primary care patient of Oley Bascom RAMAN, NP and is actively engaged with the care management team. I reached out to Jeffrey [name] by phone today to assist with re-scheduling  with the Licensed Clinical Child Psychotherapist.  Follow up plan: Unsuccessful telephone outreach attempt made. A HIPAA compliant phone message was left for the patient providing contact information and requesting a return call.  Leotis Rase [facility], [facility] Guide  Direct Dial: [phone]  Fax [phone]

## 2024-08-09 NOTE — Progress Notes (Signed)
 08/09/2024 Name: Jeffrey [name] MRN: [id number] DOB: June 10, 1976  Chief Complaint  Patient presents with   Diabetes   Congestive Heart Failure    CADARIUS [name] is a 48 y.o. year old male who presented for a telephone visit.   They were referred to the pharmacist by their PCP for assistance in managing diabetes.   PMH significant for systolic heart failure due to NICM s/p LVAD, T2DM, obesity, CKD IIIa, R renal cell carcinoma s/p nephrectomy  Subjective: Patient was engaged by pharmacy on 02/29/24 for weight management. He was tolerating Ozempic  well, and was instructed to increase to 2 mg weekly. He was seen in advanced HF clinic on 03/09/24 by Dr. Zenaida. He reported NYHA class I HF symptoms and was euvolemic. He was initiated on carvedilol  3.125 mg BID. His BMI was 43.9 kg/m2, weight 293 lbs - down from 303 lbs ~1.5 months prior. At last pharmacy telephone appt on 04/24/24, patient reported tolerating Ozempic  2 mg weekly without issue. Weight continued to decrease to 285 lbs at HF clinic visit on 05/02/24. He was seen in person by PCP, Bascom Borer, on 05/09/51. A1C continued to be controlled at 5.4% since stopping metformin . Patient was admitted for a GI bleed in 06/29/24. He was continued on Ozempic  2 mg weekly. Jardiance  was held for low volume. At [facility] clinic viist on 07/21/24, he was instructed to increase to max-dose Entresto .   Today, patient reports doing well. Confirms he is taking Entresto  97-103 mg BID. He says he is no longer taking Jardiance , unsure whether he was supposed to resume. He reports he is planning to pick up Ozempic  today. He reports he needs a refill of gabapentin  - appears he has been getting 30 tablets at a time, which is only a 10 day supply.   Care Team: Primary Care Provider: Borer Bascom RAMAN, NP ; Next Scheduled Visit: 11/08/24 Cardiologist: Dr. Bensimhon; Next Scheduled Visit: 07/21/24  Medication Access/Adherence  Current Pharmacy:  [facility] [address], [address] [place] [address] Phone: [phone] Fax: [phone]  [id number] [place], [place] - [address] AT [facility] [address] [place] [address] Phone: [phone] Fax: [phone]  [facility] [address] [place] [address] Phone: [phone] Fax: [phone]   Patient reports affordability concerns with their medications: No  Patient reports access/transportation concerns to their pharmacy: No  Patient reports adherence concerns with their medications:  No     Diabetes:  Current medications: Ozempic  2 mg weekly,   Previous: Jardiance  10 mg (holding since 07/08/24 due to low volume)  Patient reports he is tolerating Ozempic  2 mg well. Denies GI side effects and has noticed his appetite decreasing this last dose increase. He has been weighing himself and has noticed slow/consistent weight loss.  Baseline weight (Feb 2025- restarted Ozempic ): 308.4 lbs (BMI 48.3 kg/m2) Most recent weight (07/21/24): 294 lbs (BMI 46.11)  Current meal patterns: Usually has 2 meals per day + snacks.  - feels his appetite is getting smaller - Breakfast: blueberries, raspberries, apple, eggs - Supper: meat and vegetable - Snacks: has not been snacking as much - Drinks: mainly water , occasionally hot green tea (no sugar)  Current physical activity: Previously reported he is doing some physical activity, but not as much as he would like to. Reported walking and doing weight bearing exercise (occasional strength training).   Objective:  Lab  Results  Component Value Date   HGBA1C 5.4 05/08/2024   HGBA1C 5.5 01/05/2024   HGBA1C 5.7 (A) 10/07/2023    Lab Results  Component Value Date   CREATININE 1.55 (H) 07/21/2024   BUN 25 (H) 07/21/2024   NA 139 07/21/2024   K 3.9 07/21/2024   CL 107 07/21/2024   CO2 22 07/21/2024    Lab Results   Component Value Date   CHOL 123 03/18/2023   HDL 46 03/18/2023   LDLCALC 53 03/18/2023   TRIG 138 03/18/2023   CHOLHDL 2.7 03/18/2023    Medications Reviewed Today     Reviewed by Brinda Lorain SQUIBB, RPH (Pharmacist) on 08/09/24 at 1218  Med List Status: <None>   Medication Order Taking? Sig Documenting Provider Last Dose Status Informant  acetaminophen  (TYLENOL ) 325 MG tablet [id number]  Take 2 tablets (650 mg total) by mouth every 6 (six) hours as needed for fever. Lenetta Greig BIRCH, NP  Active Self, Pharmacy Records           Med Note (SATTERFIELD, TEENA BRAVO   Thu Jun 29, 2024  5:35 PM)    allopurinol  (ZYLOPRIM ) 100 MG tablet [id number]  Take 1 tablet (100 mg total) by mouth daily. Bensimhon, Toribio SAUNDERS, MD  Active Self, Pharmacy Records  amiodarone  (PACERONE ) 200 MG tablet [id number] Yes Take 1 tablet (200 mg total) by mouth daily. Bensimhon, Toribio SAUNDERS, MD  Active   Blood Pressure KIT [id number]  1 Units by Does not apply route daily. Oley Bascom RAMAN, NP  Active Self, Pharmacy Records  cefadroxil  (DURICEF) 500 MG capsule [id number]  Take 1 capsule (500 mg total) by mouth 2 (two) times daily. Bensimhon, Toribio SAUNDERS, MD  Active Self, Pharmacy Records  colchicine  0.6 MG tablet [id number]  Take 1 tablet (0.6 mg total) by mouth once daily. Bensimhon, Toribio SAUNDERS, MD  Active Self, Pharmacy Records  cyanocobalamin  (VITAMIN B12) 1000 MCG tablet [id number]  Take 1 tablet (1,000 mcg total) by mouth daily. Oley Bascom RAMAN, NP  Active Self, Pharmacy Records  cyclobenzaprine  (FLEXERIL ) 5 MG tablet [id number]  Take 1 tablet (5 mg total) by mouth 3 (three) times daily as needed for muscle spasms   Active Self, Pharmacy Records  EPINEPHrine  0.3 mg/0.3 mL IJ SOAJ injection [id number]  Inject 0.3 mL (0.3 mg total) into the thigh once for 1 dose as directed.   Active   ferrous sulfate  (FEROSUL) 325 (65 FE) MG tablet [id number]  Take 1 tablet (325 mg total) by mouth daily before breakfast. Nichols, Tonya S, NP  Active Self, Pharmacy  Records  folic acid  (FOLVITE ) 800 MCG tablet [id number]  Take 0.5 tablets (400 mcg total) by mouth daily. Oley Bascom RAMAN, NP  Active Self, Pharmacy Records  gabapentin  (NEURONTIN ) 400 MG capsule [id number]  Take 1 capsule (400 mg total) by mouth 3 (three) times daily.  Patient not taking: Reported on 08/09/2024   Oley Bascom RAMAN, NP  Active   glucose blood test strip [id number]  Use as directed 4 times daily. Shannan Sia I, NP  Active Self, Pharmacy Records  isosorbide -hydrALAZINE  (BIDIL ) 20-37.5 MG tablet [id number] Yes Take 2 tablets by mouth 3 (three) times daily. Bensimhon, Toribio SAUNDERS, MD  Active Self, Pharmacy Records  meclizine  (ANTIVERT ) 25 MG tablet [id number]  Take 1 tablet (25 mg total) by mouth 3 (three) times daily as needed for dizziness. Bensimhon, Toribio SAUNDERS, MD  Active Self, Pharmacy Records  ondansetron  (ZOFRAN -ODT) 4 MG disintegrating tablet [id number]  Take 1 tablet (4 mg total)  by mouth every 8 (eight) hours as needed for nausea or vomiting. Zenaida Morene PARAS, MD  Active Self, Pharmacy Records  pantoprazole  (PROTONIX ) 40 MG tablet [id number] Yes Take 1 tablet (40 mg total) by mouth once daily. Bensimhon, Toribio SAUNDERS, MD  Active Self, Pharmacy Records  rosuvastatin  (CRESTOR ) 10 MG tablet [id number] Yes Take 1 tablet (10 mg total) by mouth daily. Bensimhon, Toribio SAUNDERS, MD  Active Self, Pharmacy Records  sacubitril -valsartan  (ENTRESTO ) 97-103 MG [id number] Yes Take 1 tablet by mouth 2 (two) times daily. Bensimhon, Toribio SAUNDERS, MD  Active   Semaglutide , 2 MG/DOSE, 8 MG/3ML SOPN [id number] Yes Inject 2 mg as directed once a week. Oley Bascom RAMAN, NP  Active Self, Pharmacy Records           Med Note (SATTERFIELD, TEENA FORBES Schaumann Jun 29, 2024  5:31 PM) Take on Sundays   sertraline  (ZOLOFT ) 50 MG tablet [id number] Yes Take 3 tablets (150 mg total) by mouth daily. Bensimhon, Toribio SAUNDERS, MD  Active    Patient not taking:   Discontinued 08/09/24 1217 (Expired Prescription) traZODone  (DESYREL ) 100 MG tablet  [id number]  Take 1 tablet (100 mg total) by mouth at bedtime. Bensimhon, Toribio SAUNDERS, MD  Active Self, Pharmacy Records  warfarin (COUMADIN ) 4 MG tablet [id number] Yes Take 2 tablets by mouth daily (8 mg total)  or as directed by HF clinic Bensimhon, Toribio SAUNDERS, MD  Active               Assessment/Plan:   Diabetes: - Currently controlled with last A1C 5.4%, below goal <7%. His goal is to lose weight to be eligible for heart transplant - needs BMI ~35. His BMI has improved from 48 to 46 kg/m2 since restarting Ozempic  in Feb 2025.  Discussed the importance of lifestyle along with medication to help with weight loss. Appropriate to continue Ozempic  to 2 mg weekly for weight loss benefit as he is tolerating this well. His BG continued to be controlled since discontinuing metformin  for decreased renal function. - Reviewed long term cardiovascular and renal outcomes of uncontrolled blood sugar - Reviewed goal A1c, goal fasting, and goal 2 hour post prandial glucose - Reviewed dietary modifications including continuing to limit his portion sizes - Reviewed lifestyle modifications including: encouraged him to increase physical activity with the goal of walking for 60 minutes every day as he was previously - Recommend to continue Ozempic  to 2 mg weekly  - Encouraged to discuss whether he should resume Jardiance  at [facility] clinic appt on 09/11/24  - Patient denies personal or family history of multiple endocrine neoplasia type 2, medullary thyroid  cancer; personal history of pancreatitis or gallbladder disease.   Collaborated with PCP to update prescription for gabapentin  for a 30 day supply. Discussed sending Ozempic  for 3 mo supply for next fill.    Follow Up Plan: PharmD telephone 11/15/23   Lorain Baseman, PharmD [facility] [phone]

## 2024-08-11 ENCOUNTER — Other Ambulatory Visit: Payer: Self-pay

## 2024-08-11 NOTE — Progress Notes (Signed)
 Complex Care Management Care Guide Note  08/11/2024 Name: [name] MRN: [id number] DOB: Apr 22, 1976  Jeffrey [name] is a 48 y.o. year old male who is a primary care patient of Oley Bascom RAMAN, NP and is actively engaged with the care management team. I reached out to Jeffrey [name] by phone today to assist with re-scheduling  with the Licensed Clinical Child Psychotherapist.  Follow up plan: Unsuccessful telephone outreach attempt made. A HIPAA compliant phone message was left for the patient providing contact information and requesting a return call.  Leotis Rase [facility], [facility] Guide  Direct Dial: [phone]  Fax [phone]

## 2024-08-18 ENCOUNTER — Other Ambulatory Visit (HOSPITAL_COMMUNITY): Payer: Self-pay | Admitting: *Deleted

## 2024-08-18 ENCOUNTER — Other Ambulatory Visit: Payer: Self-pay

## 2024-08-18 DIAGNOSIS — Z7901 Long term (current) use of anticoagulants: Secondary | ICD-10-CM

## 2024-08-18 DIAGNOSIS — Z95811 Presence of heart assist device: Secondary | ICD-10-CM

## 2024-08-21 ENCOUNTER — Other Ambulatory Visit: Payer: Self-pay

## 2024-08-23 ENCOUNTER — Ambulatory Visit (HOSPITAL_COMMUNITY)
Admission: RE | Admit: 2024-08-23 | Discharge: 2024-08-23 | Disposition: A | Discharge status: Home / Self Care | Source: Ambulatory Visit | Attending: Cardiology | Admitting: Cardiology

## 2024-08-23 ENCOUNTER — Other Ambulatory Visit: Payer: Self-pay

## 2024-08-23 ENCOUNTER — Ambulatory Visit (HOSPITAL_COMMUNITY): Payer: Self-pay | Admitting: Pharmacist

## 2024-08-23 DIAGNOSIS — Z95811 Presence of heart assist device: Secondary | ICD-10-CM | POA: Insufficient documentation

## 2024-08-23 DIAGNOSIS — Z7901 Long term (current) use of anticoagulants: Secondary | ICD-10-CM | POA: Diagnosis not present

## 2024-08-23 LAB — PROTIME-INR
INR: 2.4 — ABNORMAL HIGH (ref 0.8–1.2)
Prothrombin Time: 27.7 s — ABNORMAL HIGH (ref 11.4–15.2)

## 2024-08-23 MED ORDER — AMOXICILLIN-POT CLAVULANATE 875-125 MG PO TABS
1.0000 | ORAL_TABLET | Freq: Two times a day (BID) | ORAL | 0 refills | Status: AC
  Filled 2024-08-23: qty 14, 7d supply, fill #0

## 2024-08-23 NOTE — Progress Notes (Signed)
 Pt called into [facility] reporting he has has a cold for 5 days with congestion, productive cough and sinus pressure. Denies fever or chills. Discussed with Dr. Bensimhon. Orders placed for Augmentin  875-125 mg BID for 7 days. Pt advised to continue to hydrate well and rest.  Schuyler Lunger RN, BSN [facility] Coordinator 24/7 Pager [phone]

## 2024-08-25 ENCOUNTER — Other Ambulatory Visit (HOSPITAL_COMMUNITY): Payer: Self-pay

## 2024-08-25 ENCOUNTER — Other Ambulatory Visit: Payer: Self-pay

## 2024-08-25 DIAGNOSIS — Z95811 Presence of heart assist device: Secondary | ICD-10-CM

## 2024-08-25 DIAGNOSIS — Z7901 Long term (current) use of anticoagulants: Secondary | ICD-10-CM

## 2024-08-28 ENCOUNTER — Other Ambulatory Visit: Payer: Self-pay

## 2024-08-28 NOTE — Patient Outreach (Signed)
 Complex Care Management   Visit Note  08/28/2024  Name:  Jeffrey [name] MRN: [id number] DOB: 29-Jun-1976  Situation: Referral received for Complex Care Management related to Heart Failure and Diabetes with Complications [name] obtained verbal consent from Patient.  Visit completed with Patient  on the phone  Background:   Past Medical History:  Diagnosis Date   Anxiety    Aspirin  allergy    Childhood asthma    Chronic systolic CHF (congestive heart failure) (HCC)    a. EF 20-25% in 2012. b. EF 45-50% in 10/2011 with nonischemic nuc - presumed NICM. c. 12/2014 Echo: Sev depressed LV fxn, sev dil LV, mild LVH, mild MR, sev dil LA, mildly reduced RV fxn.   CKD (chronic kidney disease) stage 2, GFR 60-89 ml/min    Depression    H/O vasectomy 12/2019   High cholesterol    Hypertension    Morbid obesity (HCC)    Nephrolithiasis    OSA on CPAP    Paroxysmal atrial fibrillation (HCC)    Presumed NICM    a. 04/2014 Myoview : EF 26%, glob HK, sev glob HK, ? prior infarct;  b. Never cathed 2/2 CKD.   Renal cell carcinoma (HCC)    a. [name]/p Rt robotic assisted partial converted to radical nephrectomy on 01/2013.   Troponin level elevated    a. 04/2014, 12/2014: felt due to CHF.   Type II diabetes mellitus (HCC)    Ventricular tachycardia (HCC)    a. appropriate ICD therapy 12/2017    Assessment: Patient Reported Symptoms:  Cognitive Cognitive Status: Able to follow simple commands, Alert and oriented to person, place, and time, Normal speech and language skills Cognitive/Intellectual Conditions Management [RPT]: None reported or documented in medical history or problem list   Health Maintenance Behaviors: Annual physical exam, Healthy diet Health Facilitated by: Healthy diet, Rest  Neurological Neurological Review of Symptoms: No symptoms reported    HEENT HEENT Symptoms Reported: No symptoms reported      Cardiovascular Cardiovascular Symptoms Reported: No symptoms reported Does patient have  uncontrolled Hypertension?: Yes Is patient checking Blood Pressure at home?: Yes Patient'[name] Recent BP reading at home: normal on Saturday per patient Cardiovascular Management Strategies: Medical device, Medication therapy, Weight management Do You Have a Working Readable Scale?: Yes Weight: 284 lb (128.8 kg) (Patient reported)  Respiratory Respiratory Symptoms Reported: No symptoms reported    Endocrine Endocrine Symptoms Reported: No symptoms reported Is patient diabetic?: Yes Is patient checking blood sugars at home?: Yes List most recent blood sugar readings, include date and time of day: no higher than 100    Gastrointestinal Gastrointestinal Symptoms Reported: No symptoms reported Additional Gastrointestinal Details: Patient denies blood in his stool Gastrointestinal Management Strategies: Medication therapy Gastrointestinal Comment: Note per chart review patient is scheduled for his first iron infusion tomorrow.    Genitourinary Genitourinary Symptoms Reported: No symptoms reported    Integumentary Integumentary Symptoms Reported: No symptoms reported    Musculoskeletal Musculoskelatal Symptoms Reviewed: No symptoms reported Musculoskeletal Comment: Denies falls since previous CMRN visit Falls in the past year?: No Number of falls in past year: 1 or less Was there an injury with Fall?: No Fall Risk Category Calculator: 0 Patient Fall Risk Level: Low Fall Risk Patient at Risk for Falls Due to: No Fall Risks Fall risk Follow up: Falls evaluation completed, Education provided, Falls prevention discussed  Psychosocial Psychosocial Symptoms Reported: Depression - if selected complete PHQ 2-9 Additional Psychological Details: Patient reports the holidays are more rough  for me. Reminded of upcoming appointment with LCSW Behavioral Management Strategies: Support group Behaviors When Feeling Stressed/Fearful: Isolates himself/stays to himself      08/28/2024    PHQ2-9  Depression Screening   Little interest or pleasure in doing things Not at all  Feeling down, depressed, or hopeless Several days  PHQ-2 - Total Score 1  Trouble falling or staying asleep, or sleeping too much    Feeling tired or having little energy    Poor appetite or overeating     Feeling bad about yourself - or that you are a failure or have let yourself or your family down    Trouble concentrating on things, such as reading the newspaper or watching television    Moving or speaking so slowly that other people could have noticed.  Or the opposite - being so fidgety or restless that you have been moving around a lot more than usual    Thoughts that you would be better off dead, or hurting yourself in some way    PHQ2-9 Total Score    If you checked off any problems, how difficult have these problems made it for you to do your work, take care of things at home, or get along with other people    Depression Interventions/Treatment      Today'[name] Vitals   08/28/24 1406  Weight: 284 lb (128.8 kg)   Pain Scale: 0-10 Pain Score: 0-No pain  Medications Reviewed Today     Reviewed by Jeffrey Rosaline SQUIBB, RN (Registered Nurse) on 08/28/24 at 1412  Med List Status: <None>   Medication Order Taking? Sig Documenting Provider Last Dose Status Informant  acetaminophen  (TYLENOL ) 325 MG tablet [id number]  Take 2 tablets (650 mg total) by mouth every 6 (six) hours as needed for fever. Jeffrey Greig BIRCH, NP  Active Self, Pharmacy Records           Med Note ([name], Jeffrey BRAVO   Thu Jun 29, 2024  5:35 PM)    allopurinol  (ZYLOPRIM ) 100 MG tablet [id number]  Take 1 tablet (100 mg total) by mouth daily. [name], Jeffrey SAUNDERS, MD  Active Self, Pharmacy Records  amiodarone  (PACERONE ) 200 MG tablet [id number]  Take 1 tablet (200 mg total) by mouth daily. [name], Jeffrey R, MD  Active   amoxicillin -clavulanate (AUGMENTIN ) 875-125 MG tablet [id number]  Take 1 tablet by mouth 2 (two) times daily for 7 days.  [name], Jeffrey SAUNDERS, MD  Active   Blood Pressure KIT [id number]  1 Units by Does not apply route daily. Jeffrey Bascom RAMAN, NP  Active Self, Pharmacy Records  cefadroxil  (DURICEF) 500 MG capsule [id number]  Take 1 capsule (500 mg total) by mouth 2 (two) times daily. [name], Jeffrey SAUNDERS, MD  Active Self, Pharmacy Records  colchicine  0.6 MG tablet [id number]  Take 1 tablet (0.6 mg total) by mouth once daily. [name], Jeffrey SAUNDERS, MD  Active Self, Pharmacy Records  cyanocobalamin  (VITAMIN B12) 1000 MCG tablet [id number]  Take 1 tablet (1,000 mcg total) by mouth daily. Jeffrey Bascom RAMAN, NP  Active Self, Pharmacy Records  cyclobenzaprine  (FLEXERIL ) 5 MG tablet [id number]  Take 1 tablet (5 mg total) by mouth 3 (three) times daily as needed for muscle spasms   Active Self, Pharmacy Records  EPINEPHrine  0.3 mg/0.3 mL IJ SOAJ injection [id number]  Inject 0.3 mL (0.3 mg total) into the thigh once for 1 dose as directed.   Active   ferrous sulfate  (FEROSUL) 325 (65 FE) MG tablet [id number]  Take 1 tablet (  325 mg total) by mouth daily before breakfast. [name], Jeffrey S, NP  Active Self, Pharmacy Records  folic acid  (FOLVITE ) 800 MCG tablet [id number]  Take 0.5 tablets (400 mcg total) by mouth daily. Jeffrey Bascom RAMAN, NP  Active Self, Pharmacy Records  gabapentin  (NEURONTIN ) 400 MG capsule [id number]  Take 1 capsule (400 mg total) by mouth 3 (three) times daily.  Patient not taking: Reported on 08/09/2024   Jeffrey Bascom RAMAN, NP  Active   glucose blood test strip [id number]  Use as directed 4 times daily. Jeffrey Sia I, NP  Active Self, Pharmacy Records  isosorbide -hydrALAZINE  (BIDIL ) 20-37.5 MG tablet [id number]  Take 2 tablets by mouth 3 (three) times daily. [name], Jeffrey SAUNDERS, MD  Active Self, Pharmacy Records  meclizine  (ANTIVERT ) 25 MG tablet [id number]  Take 1 tablet (25 mg total) by mouth 3 (three) times daily as needed for dizziness. [name], Jeffrey SAUNDERS, MD  Active Self, Pharmacy Records  ondansetron  (ZOFRAN -ODT) 4  MG disintegrating tablet [id number]  Take 1 tablet (4 mg total) by mouth every 8 (eight) hours as needed for nausea or vomiting. Jeffrey Morene PARAS, MD  Active Self, Pharmacy Records  pantoprazole  (PROTONIX ) 40 MG tablet [id number]  Take 1 tablet (40 mg total) by mouth once daily. [name], Jeffrey SAUNDERS, MD  Active Self, Pharmacy Records  rosuvastatin  (CRESTOR ) 10 MG tablet [id number]  Take 1 tablet (10 mg total) by mouth daily. [name], Jeffrey SAUNDERS, MD  Active Self, Pharmacy Records  sacubitril -valsartan  (ENTRESTO ) 97-103 MG [id number]  Take 1 tablet by mouth 2 (two) times daily. [name], Jeffrey SAUNDERS, MD  Active   Semaglutide , 2 MG/DOSE, 8 MG/3ML SOPN [id number]  Inject 2 mg as directed once a week. Jeffrey Bascom RAMAN, NP  Active Self, Pharmacy Records           Med Note ([name], Jeffrey FORBES Schaumann Jun 29, 2024  5:31 PM) Take on Sundays   sertraline  (ZOLOFT ) 50 MG tablet [id number]  Take 3 tablets (150 mg total) by mouth daily. [name], Jeffrey SAUNDERS, MD  Active   traZODone  (DESYREL ) 100 MG tablet [id number]  Take 1 tablet (100 mg total) by mouth at bedtime. [name], Jeffrey SAUNDERS, MD  Active Self, Pharmacy Records  warfarin (COUMADIN ) 4 MG tablet [id number]  Take 2 tablets by mouth daily (8 mg total)  or as directed by HF clinic [name], Jeffrey SAUNDERS, MD  Active             Recommendation:   Continue Current Plan of Care  Follow Up Plan:   Telephone follow up appointment date/time:  09/25/24 at 2:30 PM  [name], RN MSN [place]  [facility] RN Care Manager Direct Dial: [phone]  Fax: [phone]

## 2024-08-28 NOTE — Patient Instructions (Signed)
 Visit Information  [name] was given information about Medicaid Managed Care team care coordination services as a part of their Healthy [facility] Medicaid benefit. Jeffrey [name]   If you would like to schedule transportation through your Healthy [facility] plan, please call the following number at least 2 days in advance of your appointment: [phone]  For information about your ride after you set it up, call Ride Assist at [phone]. Use this number to activate a Will Call pickup, or if your transportation is late for a scheduled pickup. Use this number, too, if you need to make a change or cancel a previously scheduled reservation.  If you need transportation services right away, call [phone]. The after-hours call center is staffed 24 hours to handle ride assistance and urgent reservation requests (including discharges) 365 days a year. Urgent trips include sick visits, hospital discharge requests and life-sustaining treatment.  Call the [facility] Line at [phone], at any time, 24 hours a day, 7 days a week. If you are in danger or need immediate medical attention call 911.   Care plan and visit instructions communicated with the patient verbally today. Patient agrees to receive a copy in MyChart. Active MyChart status and patient understanding of how to access instructions and care plan via MyChart confirmed with patient.     Telephone follow up appointment with Managed Medicaid care management team member scheduled for: 09/25/24 at 2:30 PM  Jeffrey Finlay, RN MSN [place]  [facility] RN Care Manager Direct Dial: [phone]  Fax: [phone]   Following is a copy of your plan of care:  There are no care plans that you recently modified to display for this patient.

## 2024-08-29 ENCOUNTER — Ambulatory Visit (HOSPITAL_COMMUNITY)
Admission: RE | Admit: 2024-08-29 | Discharge: 2024-08-29 | Disposition: A | Discharge status: Home / Self Care | Source: Ambulatory Visit | Attending: Internal Medicine | Admitting: Internal Medicine

## 2024-08-29 ENCOUNTER — Ambulatory Visit (HOSPITAL_COMMUNITY)
Admission: RE | Admit: 2024-08-29 | Discharge: 2024-08-29 | Disposition: A | Source: Ambulatory Visit | Attending: Cardiology

## 2024-08-29 ENCOUNTER — Ambulatory Visit (HOSPITAL_COMMUNITY): Payer: Self-pay | Admitting: Pharmacist

## 2024-08-29 VITALS — BP 126/66 | HR 66 | Temp 98.3°F | Resp 18

## 2024-08-29 DIAGNOSIS — Z7901 Long term (current) use of anticoagulants: Secondary | ICD-10-CM

## 2024-08-29 DIAGNOSIS — D508 Other iron deficiency anemias: Secondary | ICD-10-CM | POA: Diagnosis not present

## 2024-08-29 DIAGNOSIS — Z95811 Presence of heart assist device: Secondary | ICD-10-CM | POA: Insufficient documentation

## 2024-08-29 DIAGNOSIS — Z4509 Encounter for adjustment and management of other cardiac device: Secondary | ICD-10-CM | POA: Insufficient documentation

## 2024-08-29 LAB — PROTIME-INR
INR: 2.4 — ABNORMAL HIGH (ref 0.8–1.2)
Prothrombin Time: 27.4 s — ABNORMAL HIGH (ref 11.4–15.2)

## 2024-08-29 MED ORDER — SODIUM CHLORIDE 0.9 % IV SOLN
510.0000 mg | Freq: Once | INTRAVENOUS | Status: AC
  Administered 2024-08-29: 510 mg via INTRAVENOUS
  Filled 2024-08-29: qty 510

## 2024-08-30 ENCOUNTER — Ambulatory Visit: Payer: Medicaid Other

## 2024-08-30 DIAGNOSIS — I5022 Chronic systolic (congestive) heart failure: Secondary | ICD-10-CM

## 2024-09-01 LAB — CUP PACEART REMOTE DEVICE CHECK
Battery Remaining Percentage: 60 %
Date Time Interrogation Session: 20251126220900
HighPow Impedance: 70 Ohm
Implantable Lead Connection Status: 753985
Implantable Lead Implant Date: 20160812
Implantable Lead Location: 753862
Implantable Lead Model: 3401
Implantable Pulse Generator Implant Date: 20210812
Pulse Gen Serial Number: 139514

## 2024-09-04 ENCOUNTER — Ambulatory Visit: Payer: Self-pay | Admitting: Cardiology

## 2024-09-04 NOTE — Progress Notes (Signed)
 Remote ICD Transmission

## 2024-09-05 ENCOUNTER — Encounter (HOSPITAL_COMMUNITY)

## 2024-09-07 ENCOUNTER — Other Ambulatory Visit: Payer: Self-pay

## 2024-09-07 ENCOUNTER — Encounter (HOSPITAL_COMMUNITY): Payer: Self-pay | Admitting: Internal Medicine

## 2024-09-07 ENCOUNTER — Other Ambulatory Visit (HOSPITAL_COMMUNITY): Payer: Self-pay | Admitting: Internal Medicine

## 2024-09-07 ENCOUNTER — Other Ambulatory Visit (HOSPITAL_COMMUNITY): Payer: Self-pay | Admitting: *Deleted

## 2024-09-07 DIAGNOSIS — T827XXA Infection and inflammatory reaction due to other cardiac and vascular devices, implants and grafts, initial encounter: Secondary | ICD-10-CM

## 2024-09-07 DIAGNOSIS — Z7901 Long term (current) use of anticoagulants: Secondary | ICD-10-CM

## 2024-09-07 DIAGNOSIS — I5023 Acute on chronic systolic (congestive) heart failure: Secondary | ICD-10-CM

## 2024-09-07 DIAGNOSIS — I5022 Chronic systolic (congestive) heart failure: Secondary | ICD-10-CM

## 2024-09-07 MED ORDER — PANTOPRAZOLE SODIUM 40 MG PO TBEC
40.0000 mg | DELAYED_RELEASE_TABLET | Freq: Every day | ORAL | 3 refills | Status: AC
  Filled 2024-09-07: qty 90, 90d supply, fill #0

## 2024-09-08 ENCOUNTER — Other Ambulatory Visit: Payer: Self-pay

## 2024-09-08 ENCOUNTER — Other Ambulatory Visit: Payer: Self-pay | Admitting: Licensed Clinical Social Worker

## 2024-09-08 NOTE — Patient Outreach (Signed)
 Complex Care Management   Visit Note  09/08/2024  Name:  OMARRI EICH MRN: [id number] DOB: 06-28-1976  Situation: Referral received for Complex Care Management related to Mental/Behavioral Health diagnosis Anxiety and Depression (Seasonal) I obtained verbal consent from Patient.  Visit completed with Patient  on the phone  Background:   Past Medical History:  Diagnosis Date   Anxiety    Aspirin  allergy    Childhood asthma    Chronic systolic CHF (congestive heart failure) (HCC)    a. EF 20-25% in 2012. b. EF 45-50% in 10/2011 with nonischemic nuc - presumed NICM. c. 12/2014 Echo: Sev depressed LV fxn, sev dil LV, mild LVH, mild MR, sev dil LA, mildly reduced RV fxn.   CKD (chronic kidney disease) stage 2, GFR 60-89 ml/min    Depression    H/O vasectomy 12/2019   High cholesterol    Hypertension    Morbid obesity (HCC)    Nephrolithiasis    OSA on CPAP    Paroxysmal atrial fibrillation (HCC)    Presumed NICM    a. 04/2014 Myoview : EF 26%, glob HK, sev glob HK, ? prior infarct;  b. Never cathed 2/2 CKD.   Renal cell carcinoma (HCC)    a. s/p Rt robotic assisted partial converted to radical nephrectomy on 01/2013.   Troponin level elevated    a. 04/2014, 12/2014: felt due to CHF.   Type II diabetes mellitus (HCC)    Ventricular tachycardia (HCC)    a. appropriate ICD therapy 12/2017    Assessment: Patient Reported Symptoms:  Cognitive Cognitive Status: No symptoms reported, Alert and oriented to person, place, and time, Normal speech and language skills Cognitive/Intellectual Conditions Management [RPT]: None reported or documented in medical history or problem list   Health Maintenance Behaviors: Annual physical exam  Neurological Neurological Review of Symptoms: No symptoms reported    HEENT HEENT Symptoms Reported: No symptoms reported      Cardiovascular Cardiovascular Symptoms Reported: No symptoms reported    Respiratory Respiratory Symptoms Reported: No symptoms  reported    Endocrine Endocrine Symptoms Reported: No symptoms reported    Gastrointestinal Gastrointestinal Symptoms Reported: No symptoms reported      Genitourinary Genitourinary Symptoms Reported: No symptoms reported    Integumentary Integumentary Symptoms Reported: No symptoms reported    Musculoskeletal Musculoskelatal Symptoms Reviewed: No symptoms reported   Falls in the past year?: No Number of falls in past year: 1 or less Was there an injury with Fall?: No Fall Risk Category Calculator: 0 Patient Fall Risk Level: Low Fall Risk    Psychosocial Psychosocial Symptoms Reported: Depression - if selected complete PHQ 2-9 Behavioral Management Strategies: Adequate rest, Coping strategies, Medication therapy, Counseling Major Change/Loss/Stressor/Fears (CP): Medical condition, self Techniques to Cope with Loss/Stress/Change: Diversional activities, Medication, Counseling Do you feel physically threatened by others?: No    09/08/2024    PHQ2-9 Depression Screening   Little interest or pleasure in doing things    Feeling down, depressed, or hopeless    PHQ-2 - Total Score    Trouble falling or staying asleep, or sleeping too much    Feeling tired or having little energy    Poor appetite or overeating     Feeling bad about yourself - or that you are a failure or have let yourself or your family down    Trouble concentrating on things, such as reading the newspaper or watching television    Moving or speaking so slowly that other people could have noticed.  Or the opposite - being so fidgety or restless that you have been moving around a lot more than usual    Thoughts that you would be better off dead, or hurting yourself in some way    PHQ2-9 Total Score    If you checked off any problems, how difficult have these problems made it for you to do your work, take care of things at home, or get along with other people    Depression Interventions/Treatment      There were no  vitals filed for this visit.    Medications Reviewed Today     Reviewed by Ezzard Rolin BIRCH, LCSW (Social Worker) on 09/08/24 at 1638  Med List Status: <None>   Medication Order Taking? Sig Documenting Provider Last Dose Status Informant  acetaminophen  (TYLENOL ) 325 MG tablet [id number]  Take 2 tablets (650 mg total) by mouth every 6 (six) hours as needed for fever. Lenetta Greig BIRCH, NP  Active Self, Pharmacy Records           Med Note (SATTERFIELD, TEENA BRAVO   Thu Jun 29, 2024  5:35 PM)    allopurinol  (ZYLOPRIM ) 100 MG tablet [id number]  Take 1 tablet (100 mg total) by mouth daily. Bensimhon, Toribio SAUNDERS, MD  Active Self, Pharmacy Records  amiodarone  (PACERONE ) 200 MG tablet [id number]  Take 1 tablet (200 mg total) by mouth daily. Bensimhon, Toribio SAUNDERS, MD  Active   Blood Pressure KIT [id number]  1 Units by Does not apply route daily. Oley Bascom RAMAN, NP  Active Self, Pharmacy Records  cefadroxil  (DURICEF) 500 MG capsule [id number]  Take 1 capsule (500 mg total) by mouth 2 (two) times daily. Bensimhon, Toribio SAUNDERS, MD  Active Self, Pharmacy Records  colchicine  0.6 MG tablet [id number]  Take 1 tablet (0.6 mg total) by mouth once daily. Bensimhon, Toribio SAUNDERS, MD  Active Self, Pharmacy Records  cyanocobalamin  (VITAMIN B12) 1000 MCG tablet [id number]  Take 1 tablet (1,000 mcg total) by mouth daily. Oley Bascom RAMAN, NP  Active Self, Pharmacy Records  cyclobenzaprine  (FLEXERIL ) 5 MG tablet [id number]  Take 1 tablet (5 mg total) by mouth 3 (three) times daily as needed for muscle spasms   Active Self, Pharmacy Records  EPINEPHrine  0.3 mg/0.3 mL IJ SOAJ injection [id number]  Inject 0.3 mL (0.3 mg total) into the thigh once for 1 dose as directed.   Active   ferrous sulfate  (FEROSUL) 325 (65 FE) MG tablet [id number]  Take 1 tablet (325 mg total) by mouth daily before breakfast. Nichols, Tonya S, NP  Active Self, Pharmacy Records  folic acid  (FOLVITE ) 800 MCG tablet [id number]  Take 0.5 tablets (400 mcg total) by mouth daily.  Oley Bascom RAMAN, NP  Active Self, Pharmacy Records  gabapentin  (NEURONTIN ) 400 MG capsule [id number]  Take 1 capsule (400 mg total) by mouth 3 (three) times daily.  Patient not taking: Reported on 08/09/2024   Oley Bascom RAMAN, NP  Active   glucose blood test strip [id number]  Use as directed 4 times daily. Shannan Sia I, NP  Active Self, Pharmacy Records  isosorbide -hydrALAZINE  (BIDIL ) 20-37.5 MG tablet [id number]  Take 2 tablets by mouth 3 (three) times daily. Bensimhon, Toribio SAUNDERS, MD  Active Self, Pharmacy Records  meclizine  (ANTIVERT ) 25 MG tablet [id number]  Take 1 tablet (25 mg total) by mouth 3 (three) times daily as needed for dizziness. Bensimhon, Toribio SAUNDERS, MD  Active Self, Pharmacy Records  ondansetron  (ZOFRAN -ODT) 4 MG disintegrating tablet [id number]  Take 1 tablet (4 mg total)  by mouth every 8 (eight) hours as needed for nausea or vomiting. Zenaida Morene PARAS, MD  Active Self, Pharmacy Records  pantoprazole  (PROTONIX ) 40 MG tablet [id number]  Take 1 tablet (40 mg total) by mouth once daily. Bensimhon, Toribio SAUNDERS, MD  Active   rosuvastatin  (CRESTOR ) 10 MG tablet [id number]  Take 1 tablet (10 mg total) by mouth daily. Bensimhon, Toribio SAUNDERS, MD  Active Self, Pharmacy Records  sacubitril -valsartan  (ENTRESTO ) 97-103 MG [id number]  Take 1 tablet by mouth 2 (two) times daily. Bensimhon, Toribio SAUNDERS, MD  Active   Semaglutide , 2 MG/DOSE, 8 MG/3ML SOPN [id number]  Inject 2 mg as directed once a week. Oley Bascom RAMAN, NP  Active Self, Pharmacy Records           Med Note (SATTERFIELD, TEENA FORBES Schaumann Jun 29, 2024  5:31 PM) Take on Sundays   sertraline  (ZOLOFT ) 50 MG tablet [id number]  Take 3 tablets (150 mg total) by mouth daily. Bensimhon, Toribio SAUNDERS, MD  Active   traZODone  (DESYREL ) 100 MG tablet [id number]  Take 1 tablet (100 mg total) by mouth at bedtime. Bensimhon, Toribio SAUNDERS, MD  Active Self, Pharmacy Records  warfarin (COUMADIN ) 4 MG tablet [id number]  Take 2 tablets by mouth daily (8 mg total)  or as directed  by HF clinic Bensimhon, Toribio SAUNDERS, MD  Active             Recommendation:   Continue Current Plan of Care  Follow Up Plan:   Telephone follow-up in 1 month  Rolin Kerns, LCSW [facility]  [facility], [facility] Clinical Social Worker Direct Dial: [phone]  Fax: [phone] Website: [email or web address] 4:47 PM

## 2024-09-08 NOTE — Patient Instructions (Signed)
 Visit Information  Jeffrey [name] was given information about Medicaid Managed Care team care coordination services as a part of their [facility] Medicaid benefit. Jeffrey [name] Jeffrey [name]   If you would like to schedule transportation through your [facility] plan, please call the following number at least 2 days in advance of your appointment: [phone]  For information about your ride after you set it up, call Ride Assist at [phone]. Use this number to activate a Will Call pickup, or if your transportation is late for a scheduled pickup. Use this number, too, if you need to make a change or cancel a previously scheduled reservation.  If you need transportation services right away, call [phone]. The after-hours call center is staffed 24 hours to handle ride assistance and urgent reservation requests (including discharges) 365 days a year. Urgent trips include sick visits, hospital discharge requests and life-sustaining treatment.  Call the [facility] Line at [phone], at any time, 24 hours a day, 7 days a week. If you are in danger or need immediate medical attention call 911.   Please see education materials related to topics discussed provided by MyChart link.  Patient verbalizes understanding of instructions and care plan provided today and agrees to view in MyChart. Active MyChart status and patient understanding of how to access instructions and care plan via MyChart confirmed with patient.     Licensed Clinical Social Worker will call pt on 10/06/24 at 1 PM  Rolin Kerns, LCSW [place]  [facility], [facility] Clinical Social Worker Direct Dial: [phone]  Fax: [phone] Website: [email or web address] 4:48 PM   Following is a copy of your plan of care:   Goals Addressed             This Visit's Progress    LCSW VBCI Social Work Care Plan   On track    Problems:   Disease Management support and education needs related to  Anxiety and Depression (Seasonal)  CSW Clinical Goal(s):   Over the next 90 days the Patient will attend all scheduled medical appointments as evidenced by patient report and care team review of appointment completion in electronic MEDICAL RECORD NUMBER  demonstrate a reduction in symptoms related to Anxiety and Depression (Seasonal) .  Interventions:  Mental Health:  Evaluation of current treatment plan related to Anxiety and Depression (Seasonal) Active listening / Reflection utilized Financial Risk Analyst / information provided Discussed referral options to connect for ongoing therapy: Patient was referred to [facility]; however, referral was closed without pt establishing. LCSW will collaborate with PCP about completing new referral Emotional Support Provided Mindfulness or Relaxation training provided Provided general psycho-education for mental health needs Quality of sleep assessed & Sleep Hygiene techniques promoted Reviewed mental health medications and discussed importance of compliance: Pt reports compliance Solution-Focued Strategies employed:LCSW provided listing of local counseling resources via email Suicidal Ideation/Homicidal Ideation assessed: No report of SI/HI  Patient Goals/Self-Care Activities:  Continue taking your medication as prescribed.   Increase coping skills and stress reduction  Plan:   Telephone follow up appointment with care management team member scheduled for:  4 weeks

## 2024-09-11 ENCOUNTER — Ambulatory Visit (HOSPITAL_COMMUNITY): Admitting: Internal Medicine

## 2024-09-12 ENCOUNTER — Ambulatory Visit (HOSPITAL_COMMUNITY): Admitting: Internal Medicine

## 2024-09-14 ENCOUNTER — Ambulatory Visit (HOSPITAL_COMMUNITY): Admission: RE | Admit: 2024-09-14 | Discharge: 2024-09-14 | Attending: Internal Medicine | Admitting: Internal Medicine

## 2024-09-14 ENCOUNTER — Ambulatory Visit (HOSPITAL_COMMUNITY): Payer: Self-pay | Admitting: Pharmacist

## 2024-09-14 VITALS — BP 119/76 | HR 68 | Ht 67.0 in | Wt 290.8 lb

## 2024-09-14 DIAGNOSIS — Z95810 Presence of automatic (implantable) cardiac defibrillator: Secondary | ICD-10-CM | POA: Diagnosis not present

## 2024-09-14 DIAGNOSIS — F419 Anxiety disorder, unspecified: Secondary | ICD-10-CM | POA: Diagnosis not present

## 2024-09-14 DIAGNOSIS — Z7984 Long term (current) use of oral hypoglycemic drugs: Secondary | ICD-10-CM | POA: Diagnosis not present

## 2024-09-14 DIAGNOSIS — I480 Paroxysmal atrial fibrillation: Secondary | ICD-10-CM | POA: Diagnosis not present

## 2024-09-14 DIAGNOSIS — Z7985 Long-term (current) use of injectable non-insulin antidiabetic drugs: Secondary | ICD-10-CM | POA: Diagnosis not present

## 2024-09-14 DIAGNOSIS — F32A Depression, unspecified: Secondary | ICD-10-CM | POA: Diagnosis not present

## 2024-09-14 DIAGNOSIS — T827XXA Infection and inflammatory reaction due to other cardiac and vascular devices, implants and grafts, initial encounter: Secondary | ICD-10-CM

## 2024-09-14 DIAGNOSIS — I130 Hypertensive heart and chronic kidney disease with heart failure and stage 1 through stage 4 chronic kidney disease, or unspecified chronic kidney disease: Secondary | ICD-10-CM | POA: Diagnosis not present

## 2024-09-14 DIAGNOSIS — Z95811 Presence of heart assist device: Secondary | ICD-10-CM

## 2024-09-14 DIAGNOSIS — I5022 Chronic systolic (congestive) heart failure: Secondary | ICD-10-CM

## 2024-09-14 DIAGNOSIS — E1122 Type 2 diabetes mellitus with diabetic chronic kidney disease: Secondary | ICD-10-CM | POA: Diagnosis not present

## 2024-09-14 DIAGNOSIS — Z7901 Long term (current) use of anticoagulants: Secondary | ICD-10-CM | POA: Diagnosis not present

## 2024-09-14 DIAGNOSIS — Z6841 Body Mass Index (BMI) 40.0 and over, adult: Secondary | ICD-10-CM | POA: Diagnosis not present

## 2024-09-14 DIAGNOSIS — K5521 Angiodysplasia of colon with hemorrhage: Secondary | ICD-10-CM | POA: Diagnosis not present

## 2024-09-14 DIAGNOSIS — Z79899 Other long term (current) drug therapy: Secondary | ICD-10-CM | POA: Diagnosis not present

## 2024-09-14 DIAGNOSIS — I428 Other cardiomyopathies: Secondary | ICD-10-CM | POA: Diagnosis not present

## 2024-09-14 DIAGNOSIS — N1832 Chronic kidney disease, stage 3b: Secondary | ICD-10-CM | POA: Diagnosis not present

## 2024-09-14 LAB — CBC
HCT: 39.6 % (ref 39.0–52.0)
Hemoglobin: 11.8 g/dL — ABNORMAL LOW (ref 13.0–17.0)
MCH: 23 pg — ABNORMAL LOW (ref 26.0–34.0)
MCHC: 29.8 g/dL — ABNORMAL LOW (ref 30.0–36.0)
MCV: 77.2 fL — ABNORMAL LOW (ref 80.0–100.0)
Platelets: 216 K/uL (ref 150–400)
RBC: 5.13 MIL/uL (ref 4.22–5.81)
RDW: 27.8 % — ABNORMAL HIGH (ref 11.5–15.5)
WBC: 6 K/uL (ref 4.0–10.5)
nRBC: 0 % (ref 0.0–0.2)

## 2024-09-14 LAB — BASIC METABOLIC PANEL WITH GFR
Anion gap: 8 (ref 5–15)
BUN: 26 mg/dL — ABNORMAL HIGH (ref 6–20)
CO2: 23 mmol/L (ref 22–32)
Calcium: 9.5 mg/dL (ref 8.9–10.3)
Chloride: 108 mmol/L (ref 98–111)
Creatinine, Ser: 1.45 mg/dL — ABNORMAL HIGH (ref 0.61–1.24)
GFR, Estimated: 59 mL/min — ABNORMAL LOW (ref >60)
Glucose, Bld: 109 mg/dL — ABNORMAL HIGH (ref 70–99)
Potassium: 4.1 mmol/L (ref 3.5–5.1)
Sodium: 139 mmol/L (ref 135–145)

## 2024-09-14 LAB — LACTATE DEHYDROGENASE: LDH: 180 U/L (ref 105–235)

## 2024-09-14 LAB — PROTIME-INR
INR: 2.3 — ABNORMAL HIGH (ref 0.8–1.2)
Prothrombin Time: 26.1 s — ABNORMAL HIGH (ref 11.4–15.2)

## 2024-09-14 LAB — MAGNESIUM: Magnesium: 1.8 mg/dL (ref 1.7–2.4)

## 2024-09-14 NOTE — Patient Instructions (Signed)
 No medication changes Coumadin  dosing per [facility] Return in 2 months for follow up with Dr. Bensimhon

## 2024-09-14 NOTE — Progress Notes (Signed)
 Patient presents for 2 mo f/u clinic alone. Reports no problems with VAD drive line or equipment.   States he has been feeling good. Denies lightheadedness, dizziness, shortness of breath, falls, and signs of bleeding.   Pt has been eating a vegan diet and states that he feels really good physically eating this way. His weight is down 4 lbs today.  Vital Signs:          Doppler Pressure: 108 Automatic BP: 119/76(88)     HR: 68         SPO2: 97% on RA   Weight: 290.8 lb w/eqt Last weight:  294.8  lbs w/eqt   VAD Indication: Destination Therapy due to smoking and BMI   VAD interrogation & Equipment Management: Speed:  6300 Flow:  5.2 Power: 5.2 w PI:  2.8 Hct: 29   Alarms: none Events: 10 - 15   Fixed speed 6300 Low speed limit: 6000   Primary Controller: Replace back up battery in 17 mths Back up controller: Replace back up battery in 28 mths   Annual Equipment Maintenance on UBC/PM was performed on 07/21/24.    I reviewed the LVAD parameters from today and compared the results to the patient's prior recorded data. LVAD interrogation was NEGATIVE for significant power changes, NEGATIVE for clinical alarms and STABLE for PI events/speed drops. No programming changes were made and pump is functioning within specified parameters. Pt is performing daily controller and system monitor self tests along with completing weekly and monthly maintenance for LVAD equipment.   LVAD equipment check completed and is in good working order. Back-up equipment present.   Exit Site Care: Drive line being maintained weekly by pt's wife. Existing dressing CDI. Pt has adequate dressing supplies at home.   Device:  Left subcutaneous dual ICD Therapies: on at 250 BPM Last check: 05/31/24  BP & Labs:  Doppler 108 - Doppler is reflecting modified systolic   Hgb 11.8  - No S/S of bleeding. Specifically denies melena/BRBPR or nosebleeds.   LDH 180 with established baseline of 160 - 300. Denies  tea-colored urine. No power elevations noted on interrogation.     Plan:  No change in medications Coumadin  per Jaun Bash PharmD Return to clinic in 2 months for follow up  Lauraine Ip RN VAD Coordinator  Office: [phone]  24/7 Pager: [phone]

## 2024-09-14 NOTE — Progress Notes (Addendum)
 VAD CLINIC Follow-up Visit  PCP: Oley Bascom RAMAN, NP HF MD: DB   HPI:  Jeffrey [name] is a 48 y.o. yo male with a history of  poorly controlled HTN, R renal cell carcinoma s/p nephrectomy, CKD IIIa, DM2, OSA, gout, morbid obesity and systolic HF due to NICM. 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 D/c'd home with plan for 6 weeks IV Ancef  with plan to transition to cefadroxil  for long-term suppression on 09/28/22.   Admitted 10/25 with GI bleed hgb 4.6. Has had 8 units PRBCs. Hgb improved to 8.6 today. 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 HTN meds decreased in hospital  Here for VAD follow-up. Doing well. Remains active. Going to gym. Remains on suppressive abx. No fevers/chills. Denies orthopnea or PND. No fevers, chills or problems with driveline. No bleeding, melena or neuro symptoms. No VAD alarms. Taking all meds as prescribed.     LVAD Documentation    09/14/2024  Device Info  LVAD Type: Heartmate III  Date of Implant: 07/21/2021  Therapy Type: Destination Therapy      09/14/2024  Vitals  Heart Rate: 68 BPM  Automatic BP: 119/76  Doppler MAP: 108 mmHg  SpO2: 97 %    Last 3 Weights Weight Weight  09/14/2024 131.906 kg 290 lb 12.8 oz  08/28/2024 128.822 kg 284 lb  07/31/2024 128.822 kg 284 lb       09/14/2024  LVAD Paramaters  Speed: 6300 RPM  Flow: 5 LPM  PI: 3  Power: 5 Watts  Hematocrit: 29 %  Alarms: none  Events: 10-15  Last Speed Change Date: 05/02/2024  Last Ramp Echo Date: 05/02/2024  Last Right Heart Cath Date: 07/18/2021  Bleeding History: Yes  Type of Bleeding Hx: GI Bleed  Type of Dressing: Weekly  Annual Maintenance Date: 07/21/2024    Labs    Units 09/14/24 1422 08/29/24 1348 08/23/24 0912 07/28/24 1050 07/21/24 0854 07/14/24 1029 07/08/24 0202  INR  2.3* 2.4* 2.4*   < > 3.4*  2.5* 1.6*  LDH U/L 180  --   --   --  222*  --  143  HGB g/dL 88.1*  --   --   --  9.1* 9.0* 8.9*  CREATININE mg/dL 8.54*  --   --   --  8.44*  --  1.55*   < > = values in this interval not displayed.          Past Medical History:  Diagnosis Date   Anxiety    Aspirin  allergy    Childhood asthma    Chronic systolic CHF (congestive heart failure) (HCC)    a. EF 20-25% in 2012. b. EF 45-50% in 10/2011 with nonischemic nuc - presumed NICM. c. 12/2014 Echo: Sev depressed LV fxn, sev dil LV, mild LVH, mild MR, sev dil LA, mildly reduced RV fxn.   CKD (chronic kidney disease) stage 2, GFR 60-89 ml/min    Depression    H/O vasectomy 12/2019   High cholesterol    Hypertension    Morbid obesity (HCC)    Nephrolithiasis    OSA on CPAP    Paroxysmal atrial fibrillation (HCC)    Presumed NICM    a. 04/2014 Myoview : EF 26%, glob HK, sev glob HK, ? prior infarct;  b. Never cathed 2/2 CKD.   Renal cell carcinoma (  HCC)    a. s/p Rt robotic assisted partial converted to radical nephrectomy on 01/2013.   Troponin level elevated    a. 04/2014, 12/2014: felt due to CHF.   Type II diabetes mellitus (HCC)    Ventricular tachycardia (HCC)    a. appropriate ICD therapy 12/2017    Current Outpatient Medications  Medication Sig Dispense Refill   acetaminophen  (TYLENOL ) 325 MG tablet Take 2 tablets (650 mg total) by mouth every 6 (six) hours as needed for fever.     allopurinol  (ZYLOPRIM ) 100 MG tablet Take 1 tablet (100 mg total) by mouth daily. 90 tablet 3   amiodarone  (PACERONE ) 200 MG tablet Take 1 tablet (200 mg total) by mouth daily. 90 tablet 3   Blood Pressure KIT 1 Units by Does not apply route daily. 1 kit 0   cefadroxil  (DURICEF) 500 MG capsule Take 1 capsule (500 mg total) by mouth 2 (two) times daily. 180 capsule 3   colchicine  0.6 MG tablet Take 1 tablet (0.6 mg total) by mouth once daily. 30 tablet 6   cyanocobalamin  (VITAMIN B12) 1000 MCG tablet Take 1 tablet (1,000 mcg total) by mouth  daily. 30 tablet 0   cyclobenzaprine  (FLEXERIL ) 5 MG tablet Take 1 tablet (5 mg total) by mouth 3 (three) times daily as needed for muscle spasms 15 tablet 0   EPINEPHrine  0.3 mg/0.3 mL IJ SOAJ injection Inject 0.3 mL (0.3 mg total) into the thigh once for 1 dose as directed. 2 each 0   ferrous sulfate  (FEROSUL) 325 (65 FE) MG tablet Take 1 tablet (325 mg total) by mouth daily before breakfast. 90 tablet 1   folic acid  (FOLVITE ) 800 MCG tablet Take 0.5 tablets (400 mcg total) by mouth daily. 30 tablet 2   gabapentin  (NEURONTIN ) 400 MG capsule Take 1 capsule (400 mg total) by mouth 3 (three) times daily. 90 capsule 5   glucose blood test strip Use as directed 4 times daily. 100 each 11   isosorbide -hydrALAZINE  (BIDIL ) 20-37.5 MG tablet Take 2 tablets by mouth 3 (three) times daily. 180 tablet 6   meclizine  (ANTIVERT ) 25 MG tablet Take 1 tablet (25 mg total) by mouth 3 (three) times daily as needed for dizziness. 90 tablet 2   ondansetron  (ZOFRAN -ODT) 4 MG disintegrating tablet Take 1 tablet (4 mg total) by mouth every 8 (eight) hours as needed for nausea or vomiting. 5 tablet 0   pantoprazole  (PROTONIX ) 40 MG tablet Take 1 tablet (40 mg total) by mouth once daily. 90 tablet 3   rosuvastatin  (CRESTOR ) 10 MG tablet Take 1 tablet (10 mg total) by mouth daily. 90 tablet 3   sacubitril -valsartan  (ENTRESTO ) 97-103 MG Take 1 tablet by mouth 2 (two) times daily. 60 tablet 6   Semaglutide , 2 MG/DOSE, 8 MG/3ML SOPN Inject 2 mg as directed once a week. 3 mL 5   sertraline  (ZOLOFT ) 50 MG tablet Take 3 tablets (150 mg total) by mouth daily. 90 tablet 11   traZODone  (DESYREL ) 100 MG tablet Take 1 tablet (100 mg total) by mouth at bedtime. 30 tablet 11   warfarin (COUMADIN ) 4 MG tablet Take 2 tablets by mouth daily (8 mg total)  or as directed by HF clinic 90 tablet 11   No current facility-administered medications for this encounter.    Aspirin , Bee venom, Lisinopril , and Tomato  REVIEW OF SYSTEMS: All  systems negative except as listed in HPI, PMH and Problem list.   Vitals:   09/14/24 1432  BP: 119/76  Pulse: 68  SpO2: 97%  Weight: 131.9 kg (290 lb 12.8 oz)  Height: 5' 7 (1.702 m)   Wt Readings from Last 3 Encounters:  09/14/24 131.9 kg (290 lb 12.8 oz)  08/28/24 128.8 kg (284 lb)  07/31/24 128.8 kg (284 lb)      Exam: General:  NAD.  HEENT: normal  Neck: supple. JVP not elevated.  Carotids 2+ bilat; no bruits. No lymphadenopathy or thryomegaly appreciated. Cor: LVAD hum.  Lungs: Clear. Abdomen: obese soft, nontender, non-distended. No hepatosplenomegaly. No bruits or masses. Good bowel sounds. Driveline site clean. Anchor in place.  Extremities: no cyanosis, clubbing, rash. Warm no edema  Neuro: alert & oriented x 3. No focal deficits. Moves all 4 without problem    ASSESSMENT AND PLAN:  .1. Chronic systolic CHF - Nonischemic CM, end-stage - EF 2012; 20-25%  - Echo  8/22: EF < 20%, severe LV dilation, moderate RV dysfunction.   - RHC (8/22): well compensated hemodynamics. - s/p S-ICD - S/p HM3 LVAD 10/17 (DT) - Doing well NYHA . Volume ok  - Continue Bidil  2 tabs tid - Continue spironolactone  25 mg daily  - Continue entresto  97/130 bid - Aware of need to get to BMI ~35 to permit transplant  Body mass index is 45.55 kg/m. Now back on OZempic . Weight up 6 pounds  2. VAD  - s/p HM-3 placement 07/21/21  - VAD interrogated personally. Parameters stable. - INR 2.3 Goal INR 2.0-3.0 Discussed warfarin dosing with PharmD personally. - LDH 180 - DL site llooks good  Continue cefadroxil  for suppression - Hgb up 9.1 -> 11.8. MCV low. Check iron stores  3. DL site infection - Resolved - Continue cefadroxil  for suppression  4 Atrial fibrillation: Paroxysmal - Had severe AF with RVR 7/22 s/p DC-CV x 2 - Seen by EP. Not a candidate for ablation due to body habitus  (could consider AVN ablation and CRT, if needed)  - Had post-op AF with RVR post VAD - 10/23 S/P DC-CV  with restoration of NSR. - Remains in NSR on amio 200 daily - On warfarin  5. HTN - Blood pressure well controlled. Continue current regimen.  6. CKD stage 3B  - Has solitary kidney.   - Baseline creatinine is 1.5-1.8.  - Scr 1.45 today   7. OSA - Continue CPAP   8. DM2  - On Jardiance . - Hgb A1c 5.5 on 01/05/24 Personally reviewed - PCP following - Continue Crestor  10  9. Polyarticular gout. - quiescent  10. Depression/anxiety - Continue Zoloft  150mg  daily - Much improved  11. Morbid obesity - Body mass index is 45.55 kg/m. - Continue Ozempic .  - Aware of need for weight loss for transplant (see above)  12. GI bleed 10/25 - Admitted 10/25 with GI bleed hgb 4.6. 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  - Hgb improved today  I spent a total of 35 minutes today: 1) reviewing the patient's medical records including previous charts, labs and recent notes from other providers; 2) examining the patient and counseling them on their medical issues/explaining the plan of care; 3) adjusting meds as needed and 4) ordering lab work or other needed tests.   Toribio Fuel, MD  4:59 PM

## 2024-09-25 ENCOUNTER — Other Ambulatory Visit (HOSPITAL_COMMUNITY): Payer: Self-pay

## 2024-09-25 ENCOUNTER — Telehealth: Payer: Self-pay

## 2024-09-25 ENCOUNTER — Other Ambulatory Visit: Payer: Self-pay

## 2024-09-25 ENCOUNTER — Encounter (HOSPITAL_COMMUNITY): Payer: Self-pay | Admitting: Internal Medicine

## 2024-09-25 ENCOUNTER — Telehealth (HOSPITAL_COMMUNITY): Payer: Self-pay | Admitting: Unknown Physician Specialty

## 2024-09-25 DIAGNOSIS — Z7901 Long term (current) use of anticoagulants: Secondary | ICD-10-CM

## 2024-09-25 DIAGNOSIS — J101 Influenza due to other identified influenza virus with other respiratory manifestations: Secondary | ICD-10-CM | POA: Diagnosis not present

## 2024-09-25 DIAGNOSIS — Z95811 Presence of heart assist device: Secondary | ICD-10-CM

## 2024-09-25 MED ORDER — PROMETHAZINE-DM 6.25-15 MG/5ML PO SYRP
ORAL_SOLUTION | ORAL | 0 refills | Status: AC
  Filled 2024-09-25: qty 180, 22d supply, fill #0

## 2024-09-25 MED ORDER — OSELTAMIVIR PHOSPHATE 75 MG PO CAPS
75.0000 mg | ORAL_CAPSULE | Freq: Two times a day (BID) | ORAL | 0 refills | Status: AC
  Filled 2024-09-25: qty 10, 5d supply, fill #0

## 2024-09-25 NOTE — Patient Outreach (Signed)
 Call placed to patient for CCM follow-up. Patient reports he has not been feeling well and is on his way to urgent care to be tested for flu. He reports he did take a COVID test and it was negative. Advised patient that I will call back at a later date for follow-up, and to reach out to PCP for any needs.  [name], RN MSN [place]  VBCI Population Health RN Care Manager Direct Dial: [phone]  Fax: [phone]

## 2024-09-25 NOTE — Telephone Encounter (Signed)
 Pt called the office wanting a prescription for fever, chills and cough. Pt states that he took a covid test that was negative. Pt was instructed to go to urgent care for flu testing.   Lauraine Ip RN, BSN VAD Coordinator 24/7 Pager [phone]

## 2024-09-26 ENCOUNTER — Telehealth: Payer: Self-pay

## 2024-09-26 ENCOUNTER — Encounter (HOSPITAL_COMMUNITY)

## 2024-09-26 NOTE — Patient Instructions (Signed)
 Jeffrey [name] - I am sorry I was unable to reach you today for our scheduled appointment. I work with Oley Bascom RAMAN, NP and am calling to support your healthcare needs. Please contact me at [phone] at your earliest convenience. I look forward to speaking with you soon.   Thank you,  [name], RN MSN [place]  [facility] RN Care Manager Direct Dial: [phone]  Fax: [phone]

## 2024-09-26 NOTE — Patient Outreach (Signed)
 Care Coordination   09/26/2024 Name: Jeffrey [name] MRN: [id number] DOB: 02-06-1976   Care Coordination Outreach Attempts:  An unsuccessful outreach was attempted for an appointment today.  Follow Up Plan:  Additional outreach attempts will be made to complete CCM follow-up visit.   Encounter Outcome:  No Answer. HIPAA compliant voicemail left requesting return call.   [name], RN MSN [place]  [facility] Population Health RN Care Manager Direct Dial: [phone]  Fax: [phone]

## 2024-09-29 ENCOUNTER — Telehealth: Payer: Self-pay

## 2024-09-29 ENCOUNTER — Other Ambulatory Visit: Payer: Self-pay

## 2024-09-29 NOTE — Patient Instructions (Signed)
 Visit Information  [name] was given information about Medicaid Managed Care team care coordination services as a part of their Healthy [facility] Medicaid benefit. Jeffrey [name]   If you would like to schedule transportation through your Healthy [facility] plan, please call the following number at least 2 days in advance of your appointment: [phone]  For information about your ride after you set it up, call Ride Assist at [phone]. Use this number to activate a Will Call pickup, or if your transportation is late for a scheduled pickup. Use this number, too, if you need to make a change or cancel a previously scheduled reservation.  If you need transportation services right away, call [phone]. The after-hours call center is staffed 24 hours to handle ride assistance and urgent reservation requests (including discharges) 365 days a year. Urgent trips include sick visits, hospital discharge requests and life-sustaining treatment.  Call the [facility] Line at [phone], at any time, 24 hours a day, 7 days a week. If you are in danger or need immediate medical attention call 911.   Care plan and visit instructions communicated with the patient verbally today. Patient agrees to receive a copy in MyChart. Active MyChart status and patient understanding of how to access instructions and care plan via MyChart confirmed with patient.     Telephone follow up appointment with Managed Medicaid care management team member scheduled for: 10/27/24 at 1:30 PM  Jeffrey Finlay, RN MSN [place]  [facility] Population Health RN Care Manager Direct Dial: [phone]  Fax: [phone]   Following is a copy of your plan of care:   Goals Addressed             This Visit's Progress    VBCI RN Care Plan   On track    Problems:  Chronic Disease Management support and education needs related to CHF  Goal: Over the next 30 days the Patient will demonstrate Ongoing adherence to  prescribed treatment plan for CHF as evidenced by patient report of stable weight and no symptoms of fluid retention  Interventions:   Heart Failure Interventions: Advised patient to weigh each morning after emptying bladder Discussed importance of daily weight and advised patient to weigh and record daily Reviewed role of diuretics in prevention of fluid overload and management of heart failure; Discussed the importance of keeping all appointments with provider  General Interventions Ensured symptoms related to flu are decreasing Ensured patient continues to take Tamiflu  as ordered Emphasized importance of adequate nutrition and hydration as patient's body recovers from having the flu. Advised taking his time when getting up and ambulating to prevent falls r/t weakness  Patient Self-Care Activities:  Attend all scheduled provider appointments Call pharmacy for medication refills 3-7 days in advance of running out of medications Perform all self care activities independently  call office if I gain more than 2 pounds in one day or 5 pounds in one week use salt in moderation watch for swelling in feet, ankles and legs every day weigh myself daily follow rescue plan if symptoms flare-up  Plan:  Telephone follow up appointment with care management team member scheduled for:  10/27/24 at 1:30 PM

## 2024-09-29 NOTE — Telephone Encounter (Signed)
 Unscheduled remote transmission: PII Normal device function Noted that data points are listed at ??? such as implant date, last office interrogation.  Per tech services, this is a pt data error within the device.  Engineering to look into further  Pt may need to be seen in clinic to have info re-entered.  Recommend clinic contacts tech services next week to obtain information reviewed by engineering.  To triage.  Follow up as scheduled

## 2024-09-29 NOTE — Patient Outreach (Signed)
 Care Coordination   09/29/2024 Name: KEYGAN DUMOND MRN: [id number] DOB: [birth year]   Care Coordination Outreach Attempts:  A second unsuccessful outreach was attempted today to complete CCM follow-up visit.  Follow Up Plan:  Additional outreach attempts will be made to complete follow-up visit.   Encounter Outcome:  No Answer. HIPAA compliant voicemail left requesting return call.   [name], RN MSN   [facility] Population Health RN Care Manager Direct Dial: [phone]  Fax: [phone]

## 2024-09-29 NOTE — Patient Outreach (Signed)
 Complex Care Management   Visit Note  09/29/2024  Name:  Jeffrey [name] MRN: [id number] DOB: 1976-09-15  Situation: Referral received for Complex Care Management related to Heart Failure I obtained verbal consent from Patient.  Visit completed with Patient  on the phone  Background:   Past Medical History:  Diagnosis Date   Anxiety    Aspirin  allergy    Childhood asthma    Chronic systolic CHF (congestive heart failure) (HCC)    a. EF 20-25% in 2012. b. EF 45-50% in 10/2011 with nonischemic nuc - presumed NICM. c. 12/2014 Echo: Sev depressed LV fxn, sev dil LV, mild LVH, mild MR, sev dil LA, mildly reduced RV fxn.   CKD (chronic kidney disease) stage 2, GFR 60-89 ml/min    Depression    H/O vasectomy 12/2019   High cholesterol    Hypertension    Morbid obesity (HCC)    Nephrolithiasis    OSA on CPAP    Paroxysmal atrial fibrillation (HCC)    Presumed NICM    a. 04/2014 Myoview : EF 26%, glob HK, sev glob HK, ? prior infarct;  b. Never cathed 2/2 CKD.   Renal cell carcinoma (HCC)    a. s/p Rt robotic assisted partial converted to radical nephrectomy on 01/2013.   Troponin level elevated    a. 04/2014, 12/2014: felt due to CHF.   Type II diabetes mellitus (HCC)    Ventricular tachycardia (HCC)    a. appropriate ICD therapy 12/2017    Assessment: Patient Reported Symptoms:  Cognitive Cognitive Status: Able to follow simple commands, Alert and oriented to person, place, and time, Normal speech and language skills Cognitive/Intellectual Conditions Management [RPT]: None reported or documented in medical history or problem list   Health Maintenance Behaviors: Annual physical exam Health Facilitated by: Healthy diet, Rest  Neurological Neurological Review of Symptoms: Not assessed    HEENT HEENT Symptoms Reported: Not assessed      Cardiovascular Cardiovascular Symptoms Reported: No symptoms reported Does patient have uncontrolled Hypertension?: Yes Is patient checking Blood  Pressure at home?: Yes Cardiovascular Management Strategies: Medical device, Medication therapy, Weight management Do You Have a Working Readable Scale?: Yes Weight: 279 lb (126.6 kg) (Patient reported)  Respiratory Respiratory Symptoms Reported: Productive cough Additional Respiratory Details: Patient reports lingering symptoms of the flu I'm on the back end of it including productive cough and congestion. He reports tomorrow is the last day of Tamiflu  Respiratory Management Strategies: CPAP, Routine screening, Medication therapy  Endocrine Endocrine Symptoms Reported: Not assessed    Gastrointestinal Gastrointestinal Symptoms Reported: Change in appetite Additional Gastrointestinal Details: Patient reports yesterday was the first day he was really able to eat since the beginning of the week and testing positive for flu. Gastrointestinal Management Strategies: Medication therapy    Genitourinary Genitourinary Symptoms Reported: Not assessed    Integumentary Integumentary Symptoms Reported: Not assessed    Musculoskeletal Musculoskelatal Symptoms Reviewed: Weakness Additional Musculoskeletal Details: Patient reports some weakness and fatigue related to testing positive for flu Musculoskeletal Management Strategies: Adequate rest Musculoskeletal Comment: Patient denies falls since previous CMRN visit Falls in the past year?: No Number of falls in past year: 1 or less Was there an injury with Fall?: No Fall Risk Category Calculator: 0 Patient Fall Risk Level: Low Fall Risk Patient at Risk for Falls Due to: No Fall Risks Fall risk Follow up: Falls evaluation completed, Education provided, Falls prevention discussed  Psychosocial Psychosocial Symptoms Reported: Not assessed  09/29/2024    PHQ2-9 Depression Screening   Little interest or pleasure in doing things    Feeling down, depressed, or hopeless    PHQ-2 - Total Score    Trouble falling or staying asleep, or sleeping  too much    Feeling tired or having little energy    Poor appetite or overeating     Feeling bad about yourself - or that you are a failure or have let yourself or your family down    Trouble concentrating on things, such as reading the newspaper or watching television    Moving or speaking so slowly that other people could have noticed.  Or the opposite - being so fidgety or restless that you have been moving around a lot more than usual    Thoughts that you would be better off dead, or hurting yourself in some way    PHQ2-9 Total Score    If you checked off any problems, how difficult have these problems made it for you to do your work, take care of things at home, or get along with other people    Depression Interventions/Treatment      Today's Vitals   09/29/24 1412  Weight: 279 lb (126.6 kg)      Medications Reviewed Today     Reviewed by Arno Rosaline SQUIBB, RN (Registered Nurse) on 09/29/24 at 1415  Med List Status: <None>   Medication Order Taking? Sig Documenting Provider Last Dose Status Informant  acetaminophen  (TYLENOL ) 325 MG tablet [id number]  Take 2 tablets (650 mg total) by mouth every 6 (six) hours as needed for fever. Lenetta Greig BIRCH, NP  Active Self, Pharmacy Records           Med Note (SATTERFIELD, TEENA BRAVO   Thu Jun 29, 2024  5:35 PM)    allopurinol  (ZYLOPRIM ) 100 MG tablet [id number]  Take 1 tablet (100 mg total) by mouth daily. Bensimhon, Toribio SAUNDERS, MD  Active Self, Pharmacy Records  amiodarone  (PACERONE ) 200 MG tablet [id number]  Take 1 tablet (200 mg total) by mouth daily. Bensimhon, Toribio SAUNDERS, MD  Active   Blood Pressure KIT [id number]  1 Units by Does not apply route daily. Oley Bascom RAMAN, NP  Active Self, Pharmacy Records  cefadroxil  (DURICEF) 500 MG capsule [id number]  Take 1 capsule (500 mg total) by mouth 2 (two) times daily. Bensimhon, Toribio SAUNDERS, MD  Active Self, Pharmacy Records  colchicine  0.6 MG tablet [id number]  Take 1 tablet (0.6 mg total) by mouth once  daily. Bensimhon, Toribio SAUNDERS, MD  Active Self, Pharmacy Records  cyanocobalamin  (VITAMIN B12) 1000 MCG tablet [id number]  Take 1 tablet (1,000 mcg total) by mouth daily. Oley Bascom RAMAN, NP  Active Self, Pharmacy Records  cyclobenzaprine  (FLEXERIL ) 5 MG tablet [id number]  Take 1 tablet (5 mg total) by mouth 3 (three) times daily as needed for muscle spasms   Active Self, Pharmacy Records  EPINEPHrine  0.3 mg/0.3 mL IJ SOAJ injection [id number]  Inject 0.3 mL (0.3 mg total) into the thigh once for 1 dose as directed.   Active   ferrous sulfate  (FEROSUL) 325 (65 FE) MG tablet [id number]  Take 1 tablet (325 mg total) by mouth daily before breakfast. Nichols, Tonya S, NP  Active Self, Pharmacy Records  folic acid  (FOLVITE ) 800 MCG tablet [id number]  Take 0.5 tablets (400 mcg total) by mouth daily. Oley Bascom RAMAN, NP  Active Self, Pharmacy Records  gabapentin  (NEURONTIN ) 400 MG capsule [id number]  Take 1 capsule (400 mg total)  by mouth 3 (three) times daily. Oley Bascom RAMAN, NP  Active   glucose blood test strip [id number]  Use as directed 4 times daily. Shannan Sia I, NP  Active Self, Pharmacy Records  isosorbide -hydrALAZINE  (BIDIL ) 20-37.5 MG tablet [id number]  Take 2 tablets by mouth 3 (three) times daily. Bensimhon, Toribio SAUNDERS, MD  Active Self, Pharmacy Records  meclizine  (ANTIVERT ) 25 MG tablet [id number]  Take 1 tablet (25 mg total) by mouth 3 (three) times daily as needed for dizziness. Bensimhon, Toribio SAUNDERS, MD  Active Self, Pharmacy Records  ondansetron  (ZOFRAN -ODT) 4 MG disintegrating tablet [id number]  Take 1 tablet (4 mg total) by mouth every 8 (eight) hours as needed for nausea or vomiting. Zenaida Morene PARAS, MD  Active Self, Pharmacy Records  oseltamivir  (TAMIFLU ) 75 MG capsule [id number]  Take 1 capsule (75 mg total) by mouth 2 (two) times daily.   Active   pantoprazole  (PROTONIX ) 40 MG tablet [id number]  Take 1 tablet (40 mg total) by mouth once daily. Bensimhon, Daniel R, MD  Active    promethazine -dextromethorphan  (PROMETHAZINE -DM) 6.25-15 MG/5ML syrup [id number]  Take 1-2 teaspoons every 6 hours as needed for cough mainly at bedtime.  Do not take and drive.   Active   rosuvastatin  (CRESTOR ) 10 MG tablet [id number]  Take 1 tablet (10 mg total) by mouth daily. Bensimhon, Toribio SAUNDERS, MD  Active Self, Pharmacy Records  sacubitril -valsartan  (ENTRESTO ) 97-103 MG [id number]  Take 1 tablet by mouth 2 (two) times daily. Bensimhon, Toribio SAUNDERS, MD  Active   Semaglutide , 2 MG/DOSE, 8 MG/3ML SOPN [id number]  Inject 2 mg as directed once a week. Oley Bascom RAMAN, NP  Active Self, Pharmacy Records           Med Note (SATTERFIELD, TEENA FORBES Schaumann Jun 29, 2024  5:31 PM) Take on Sundays   sertraline  (ZOLOFT ) 50 MG tablet [id number]  Take 3 tablets (150 mg total) by mouth daily. Bensimhon, Toribio SAUNDERS, MD  Active   traZODone  (DESYREL ) 100 MG tablet [id number]  Take 1 tablet (100 mg total) by mouth at bedtime. Bensimhon, Toribio SAUNDERS, MD  Active Self, Pharmacy Records  warfarin (COUMADIN ) 4 MG tablet [id number]  Take 2 tablets by mouth daily (8 mg total)  or as directed by HF clinic Bensimhon, Toribio SAUNDERS, MD  Active             Recommendation:   Continue Current Plan of Care  Follow Up Plan:   Telephone follow up appointment date/time:  10/27/24 at 1:30 PM  [name], RN MSN [place]  [facility] RN Care Manager Direct Dial: [phone]  Fax: [phone]

## 2024-10-02 ENCOUNTER — Ambulatory Visit (HOSPITAL_COMMUNITY)

## 2024-10-03 NOTE — Telephone Encounter (Signed)
 Called [employer]. No response yet from engineering team.  They do recommend bringing patient in at some point to completely reprogram the device to correct this; however, it will create a new implant date of the date that we are making the reprogramming.    I did make them aware that the patient has an LVAD.  (Likely that may have triggered some issue).   Joey Deakins ([employer] rep) will be back in the office on 10/05/24 and I will review with him this week on his return and make a plan for updating.

## 2024-10-06 ENCOUNTER — Ambulatory Visit (HOSPITAL_COMMUNITY): Payer: Self-pay | Admitting: Pharmacist

## 2024-10-06 ENCOUNTER — Other Ambulatory Visit: Payer: Self-pay | Admitting: Licensed Clinical Social Worker

## 2024-10-06 ENCOUNTER — Ambulatory Visit (HOSPITAL_COMMUNITY)
Admission: RE | Admit: 2024-10-06 | Discharge: 2024-10-06 | Disposition: A | Discharge status: Home / Self Care | Source: Ambulatory Visit | Attending: Cardiology | Admitting: Cardiology

## 2024-10-06 ENCOUNTER — Other Ambulatory Visit (HOSPITAL_COMMUNITY): Payer: Self-pay

## 2024-10-06 DIAGNOSIS — Z95811 Presence of heart assist device: Secondary | ICD-10-CM | POA: Diagnosis not present

## 2024-10-06 DIAGNOSIS — Z7901 Long term (current) use of anticoagulants: Secondary | ICD-10-CM

## 2024-10-06 LAB — PROTIME-INR
INR: 3.8 — ABNORMAL HIGH (ref 0.8–1.2)
Prothrombin Time: 39.1 s — ABNORMAL HIGH (ref 11.4–15.2)

## 2024-10-06 NOTE — Patient Instructions (Signed)
 Visit Information  [name] was given information about Medicaid Managed Care team care coordination services as a part of their Healthy [facility] Medicaid benefit. Jeffrey [name]   If you would like to schedule transportation through your Healthy [facility] plan, please call the following number at least 2 days in advance of your appointment: [phone]  For information about your ride after you set it up, call Ride Assist at [phone]. Use this number to activate a Will Call pickup, or if your transportation is late for a scheduled pickup. Use this number, too, if you need to make a change or cancel a previously scheduled reservation.  If you need transportation services right away, call [phone]. The after-hours call center is staffed 24 hours to handle ride assistance and urgent reservation requests (including discharges) 365 days a year. Urgent trips include sick visits, hospital discharge requests and life-sustaining treatment.  Call the [facility] Line at [phone], at any time, 24 hours a day, 7 days a week. If you are in danger or need immediate medical attention call 911.   Please see education materials related to topics discussed provided by MyChart link.  Patient verbalizes understanding of instructions and care plan provided today and agrees to view in MyChart. Active MyChart status and patient understanding of how to access instructions and care plan via MyChart confirmed with patient.     Licensed Clinical Child Psychotherapist will call patient on 2/6 at 1 PM  Jeffrey Poznanski, LCSW [place]  [facility] Clinical Social Worker Direct Dial: [phone]  Fax: [phone] Website: [email or web address] 1:38 PM   Following is a copy of your plan of care:   Goals Addressed             This Visit's Progress    LCSW VBCI Social Work Care Plan   On track    Problems:   Disease Management support and education needs related  to Anxiety and Depression (Seasonal)  CSW Clinical Goal(s):   Over the next 90 days the Patient will attend all scheduled medical appointments as evidenced by patient report and care team review of appointment completion in electronic MEDICAL RECORD NUMBER  demonstrate a reduction in symptoms related to Anxiety and Depression (Seasonal) .  Interventions:  Mental Health:  Evaluation of current treatment plan related to Anxiety and Depression (Seasonal) Active listening / Reflection utilized Financial Risk Analyst / information provided Discussed referral options to connect for ongoing therapy: Patient was referred to [facility]; however, referral was closed without pt establishing. LCSW will collaborate with PCP about completing new referral 1/2: Pt will address with PCP at upcoming appt. Pt states symptoms have decreased since last he spoke with LCSW Emotional Support Provided Mindfulness or Relaxation training provided Provided general psycho-education for mental health needs Quality of sleep assessed & Sleep Hygiene techniques promoted Reviewed mental health medications and discussed importance of compliance: Pt reports compliance Solution-Focued Strategies employed:LCSW provided listing of local counseling resources via email 1/2: Pt is interested in establishing with a counselor through [facility] Suicidal Ideation/Homicidal Ideation assessed: No report of SI/HI  Patient Goals/Self-Care Activities:  Continue taking your medication as prescribed.   Increase coping skills and stress reduction  Attend upcoming medical appts  Plan:   Telephone follow up appointment with care management team member scheduled for:  4 weeks

## 2024-10-06 NOTE — Patient Outreach (Signed)
 Complex Care Management   Visit Note  10/06/2024  Name:  Jeffrey [name] MRN: [id number] DOB: 1975-11-11  Situation: Referral received for Complex Care Management related to Mental/Behavioral Health diagnosis Anxiety I obtained verbal consent from Patient.  Visit completed with Patient  on the phone  Background:   Past Medical History:  Diagnosis Date   Anxiety    Aspirin  allergy    Childhood asthma    Chronic systolic CHF (congestive heart failure) (HCC)    a. EF 20-25% in 2012. b. EF 45-50% in 10/2011 with nonischemic nuc - presumed NICM. c. 12/2014 Echo: Sev depressed LV fxn, sev dil LV, mild LVH, mild MR, sev dil LA, mildly reduced RV fxn.   CKD (chronic kidney disease) stage 2, GFR 60-89 ml/min    Depression    H/O vasectomy 12/2019   High cholesterol    Hypertension    Morbid obesity (HCC)    Nephrolithiasis    OSA on CPAP    Paroxysmal atrial fibrillation (HCC)    Presumed NICM    a. 04/2014 Myoview : EF 26%, glob HK, sev glob HK, ? prior infarct;  b. Never cathed 2/2 CKD.   Renal cell carcinoma (HCC)    a. s/p Rt robotic assisted partial converted to radical nephrectomy on 01/2013.   Troponin level elevated    a. 04/2014, 12/2014: felt due to CHF.   Type II diabetes mellitus (HCC)    Ventricular tachycardia (HCC)    a. appropriate ICD therapy 12/2017    Assessment: Patient Reported Symptoms:  Cognitive Cognitive Status: No symptoms reported, Alert and oriented to person, place, and time, Normal speech and language skills Cognitive/Intellectual Conditions Management [RPT]: None reported or documented in medical history or problem list   Health Maintenance Behaviors: Annual physical exam  Neurological Neurological Review of Symptoms: Not assessed    HEENT HEENT Symptoms Reported: Other: HEENT Management Strategies: Coping strategies, Adequate rest, Medication therapy HEENT Comment: Patient reports having the flu. Symptoms have resolved with medication and pt states he feels  better    Cardiovascular Cardiovascular Symptoms Reported: No symptoms reported    Respiratory Respiratory Symptoms Reported: No symptoms reported    Endocrine Endocrine Symptoms Reported: Not assessed    Gastrointestinal Gastrointestinal Symptoms Reported: Not assessed      Genitourinary Genitourinary Symptoms Reported: Not assessed    Integumentary Integumentary Symptoms Reported: Not assessed    Musculoskeletal Musculoskelatal Symptoms Reviewed: Not assessed        Psychosocial Psychosocial Symptoms Reported: No symptoms reported Behavioral Management Strategies: Adequate rest, Coping strategies, Medication therapy Behavioral Health Self-Management Outcome: 4 (good) Behavioral Health Comment: Pt reports decrease in symptoms and compliance with medication management. Will f/up with PCP about referral to [facility] at upcoming PCP appt Major Change/Loss/Stressor/Fears (CP): Medical condition, self Techniques to Cope with Loss/Stress/Change: Diversional activities, Counseling, Medication      10/06/2024    PHQ2-9 Depression Screening   Little interest or pleasure in doing things    Feeling down, depressed, or hopeless    PHQ-2 - Total Score    Trouble falling or staying asleep, or sleeping too much    Feeling tired or having little energy    Poor appetite or overeating     Feeling bad about yourself - or that you are a failure or have let yourself or your family down    Trouble concentrating on things, such as reading the newspaper or watching television    Moving or speaking so slowly that  other people could have noticed.  Or the opposite - being so fidgety or restless that you have been moving around a lot more than usual    Thoughts that you would be better off dead, or hurting yourself in some way    PHQ2-9 Total Score    If you checked off any problems, how difficult have these problems made it for you to do your work, take care of things at home, or get  along with other people    Depression Interventions/Treatment      There were no vitals filed for this visit.    Medications Reviewed Today     Reviewed by Mervin Ramires D, LCSW (Social Worker) on 10/06/24 at 1323  Med List Status: <None>   Medication Order Taking? Sig Documenting Provider Last Dose Status Informant  acetaminophen  (TYLENOL ) 325 MG tablet [id number]  Take 2 tablets (650 mg total) by mouth every 6 (six) hours as needed for fever. Lenetta Greig BIRCH, NP  Active Self, Pharmacy Records           Med Note (SATTERFIELD, TEENA BRAVO   Thu Jun 29, 2024  5:35 PM)    allopurinol  (ZYLOPRIM ) 100 MG tablet [id number]  Take 1 tablet (100 mg total) by mouth daily. Bensimhon, Toribio SAUNDERS, MD  Active Self, Pharmacy Records  amiodarone  (PACERONE ) 200 MG tablet [id number]  Take 1 tablet (200 mg total) by mouth daily. Bensimhon, Toribio SAUNDERS, MD  Active   Blood Pressure KIT [id number]  1 Units by Does not apply route daily. Oley Bascom RAMAN, NP  Active Self, Pharmacy Records  cefadroxil  (DURICEF) 500 MG capsule [id number]  Take 1 capsule (500 mg total) by mouth 2 (two) times daily. Bensimhon, Toribio SAUNDERS, MD  Active Self, Pharmacy Records  colchicine  0.6 MG tablet [id number]  Take 1 tablet (0.6 mg total) by mouth once daily. Bensimhon, Toribio SAUNDERS, MD  Active Self, Pharmacy Records  cyanocobalamin  (VITAMIN B12) 1000 MCG tablet [id number]  Take 1 tablet (1,000 mcg total) by mouth daily. Oley Bascom RAMAN, NP  Active Self, Pharmacy Records  cyclobenzaprine  (FLEXERIL ) 5 MG tablet [id number]  Take 1 tablet (5 mg total) by mouth 3 (three) times daily as needed for muscle spasms   Active Self, Pharmacy Records  EPINEPHrine  0.3 mg/0.3 mL IJ SOAJ injection [id number]  Inject 0.3 mL (0.3 mg total) into the thigh once for 1 dose as directed.   Active   ferrous sulfate  (FEROSUL) 325 (65 FE) MG tablet [id number]  Take 1 tablet (325 mg total) by mouth daily before breakfast. Nichols, Tonya S, NP  Active Self, Pharmacy Records  folic acid   (FOLVITE ) 800 MCG tablet [id number]  Take 0.5 tablets (400 mcg total) by mouth daily. Oley Bascom RAMAN, NP  Active Self, Pharmacy Records  gabapentin  (NEURONTIN ) 400 MG capsule [id number]  Take 1 capsule (400 mg total) by mouth 3 (three) times daily. Oley Bascom RAMAN, NP  Active   glucose blood test strip [id number]  Use as directed 4 times daily. Shannan Sia I, NP  Active Self, Pharmacy Records  isosorbide -hydrALAZINE  (BIDIL ) 20-37.5 MG tablet [id number]  Take 2 tablets by mouth 3 (three) times daily. Bensimhon, Toribio SAUNDERS, MD  Active Self, Pharmacy Records  meclizine  (ANTIVERT ) 25 MG tablet [id number]  Take 1 tablet (25 mg total) by mouth 3 (three) times daily as needed for dizziness. Bensimhon, Toribio SAUNDERS, MD  Active Self, Pharmacy Records  ondansetron  (ZOFRAN -ODT) 4 MG disintegrating tablet [id number]  Take 1 tablet (4 mg total) by mouth every  8 (eight) hours as needed for nausea or vomiting. Zenaida Morene PARAS, MD  Active Self, Pharmacy Records  oseltamivir  (TAMIFLU ) 75 MG capsule [id number]  Take 1 capsule (75 mg total) by mouth 2 (two) times daily.   Active   pantoprazole  (PROTONIX ) 40 MG tablet [id number]  Take 1 tablet (40 mg total) by mouth once daily. Bensimhon, Toribio SAUNDERS, MD  Active   promethazine -dextromethorphan  (PROMETHAZINE -DM) 6.25-15 MG/5ML syrup [id number]  Take 1-2 teaspoons every 6 hours as needed for cough mainly at bedtime.  Do not take and drive.   Active   rosuvastatin  (CRESTOR ) 10 MG tablet [id number]  Take 1 tablet (10 mg total) by mouth daily. Bensimhon, Toribio SAUNDERS, MD  Active Self, Pharmacy Records  sacubitril -valsartan  (ENTRESTO ) 97-103 MG [id number]  Take 1 tablet by mouth 2 (two) times daily. Bensimhon, Toribio SAUNDERS, MD  Active   Semaglutide , 2 MG/DOSE, 8 MG/3ML SOPN [id number]  Inject 2 mg as directed once a week. Oley Bascom RAMAN, NP  Active Self, Pharmacy Records           Med Note (SATTERFIELD, TEENA FORBES Schaumann Jun 29, 2024  5:31 PM) Take on Sundays   sertraline  (ZOLOFT ) 50 MG tablet  [id number]  Take 3 tablets (150 mg total) by mouth daily. Bensimhon, Toribio SAUNDERS, MD  Active   traZODone  (DESYREL ) 100 MG tablet [id number]  Take 1 tablet (100 mg total) by mouth at bedtime. Bensimhon, Toribio SAUNDERS, MD  Active Self, Pharmacy Records  warfarin (COUMADIN ) 4 MG tablet [id number]  Take 2 tablets by mouth daily (8 mg total)  or as directed by HF clinic Bensimhon, Toribio SAUNDERS, MD  Active             Recommendation:   PCP Follow-up  Follow Up Plan:   Telephone follow-up in 1 month  Rolin Kerns, LCSW [facility]  [facility], [facility] Clinical Social Worker Direct Dial: [phone]  Fax: [phone] Website: [email or web address] 1:38 PM

## 2024-10-09 ENCOUNTER — Other Ambulatory Visit (HOSPITAL_COMMUNITY): Payer: Self-pay | Admitting: *Deleted

## 2024-10-09 ENCOUNTER — Other Ambulatory Visit: Payer: Self-pay

## 2024-10-09 DIAGNOSIS — E1122 Type 2 diabetes mellitus with diabetic chronic kidney disease: Secondary | ICD-10-CM

## 2024-10-09 DIAGNOSIS — Z95811 Presence of heart assist device: Secondary | ICD-10-CM

## 2024-10-09 DIAGNOSIS — Z7901 Long term (current) use of anticoagulants: Secondary | ICD-10-CM

## 2024-10-09 MED ORDER — OZEMPIC (2 MG/DOSE) 8 MG/3ML ~~LOC~~ SOPN
2.0000 mg | PEN_INJECTOR | SUBCUTANEOUS | 5 refills | Status: AC
  Filled 2024-10-09: qty 3, 28d supply, fill #0
  Filled 2024-11-10: qty 3, 28d supply, fill #1

## 2024-10-09 NOTE — Telephone Encounter (Signed)
 Semaglutide , 2 MG/DOSE, (OZEMPIC , 2 MG/DOSE,) 8 MG/3ML SOPN

## 2024-10-09 NOTE — Telephone Encounter (Signed)
 Called and spoke with patient to update him to the situation and scheduled him in device clinic reprogramming  [employer] device rep Magdalene Bianchi will be present in clinic to perform device reprogramming  New building address, clinic location, and appointment time provided to patient and patient verbalized understanding

## 2024-10-10 ENCOUNTER — Ambulatory Visit: Attending: Cardiology | Admitting: *Deleted

## 2024-10-10 ENCOUNTER — Other Ambulatory Visit: Payer: Self-pay

## 2024-10-10 DIAGNOSIS — Z95810 Presence of automatic (implantable) cardiac defibrillator: Secondary | ICD-10-CM

## 2024-10-10 DIAGNOSIS — I5022 Chronic systolic (congestive) heart failure: Secondary | ICD-10-CM

## 2024-10-10 NOTE — Patient Instructions (Signed)
Patient will follow up as scheduled.

## 2024-10-10 NOTE — Progress Notes (Signed)
 Patient was seen in device clinic today for complete device programming reset by [employer] D.   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  Patient denied having received any heavy dose radiation therapy since having his device implanted as this may have been a possible cause of device issue  Due to a complete device reset the date of implant defaulted to today 10/10/24 and cannot be changed per rep report  The true device battery longevity prior to the reset read 59%   After the reset, the device battery longevity is FALSELY reading 82%   Per [employer] rep, the battery display will be falsely high for at least 3 months, maybe longer, but should eventually correct and return to the battery's actual longevity  All previous settings were restored to the patient's device (see paceart report)  Per [employer] rep, the reset should not effect the device's remote capabilities or require rescheduling the remote checks in EPIC or LATITUDE  NO CHARGE FOR VISIT TODAY

## 2024-10-13 ENCOUNTER — Ambulatory Visit (HOSPITAL_COMMUNITY)

## 2024-10-16 ENCOUNTER — Ambulatory Visit (HOSPITAL_COMMUNITY): Payer: Self-pay | Admitting: Pharmacist

## 2024-10-16 ENCOUNTER — Ambulatory Visit (HOSPITAL_COMMUNITY): Admission: RE | Admit: 2024-10-16 | Discharge: 2024-10-16 | Attending: Cardiology

## 2024-10-16 DIAGNOSIS — Z95811 Presence of heart assist device: Secondary | ICD-10-CM | POA: Diagnosis present

## 2024-10-16 DIAGNOSIS — Z7901 Long term (current) use of anticoagulants: Secondary | ICD-10-CM | POA: Diagnosis not present

## 2024-10-16 LAB — PROTIME-INR
INR: 2.7 — ABNORMAL HIGH (ref 0.8–1.2)
Prothrombin Time: 29.9 s — ABNORMAL HIGH (ref 11.4–15.2)

## 2024-10-16 NOTE — Addendum Note (Signed)
 Encounter addended by: Dante Jeannine HERO, CMA on: 10/16/2024 9:59 AM  Actions taken: Charge Capture section accepted

## 2024-10-19 ENCOUNTER — Inpatient Hospital Stay (HOSPITAL_COMMUNITY): Admission: RE | Admit: 2024-10-19 | Source: Ambulatory Visit

## 2024-10-20 ENCOUNTER — Ambulatory Visit (HOSPITAL_COMMUNITY)
Admission: RE | Admit: 2024-10-20 | Discharge: 2024-10-20 | Disposition: A | Discharge status: Home / Self Care | Source: Ambulatory Visit | Attending: Internal Medicine | Admitting: Internal Medicine

## 2024-10-20 VITALS — BP 121/101 | HR 59 | Temp 98.4°F | Resp 17

## 2024-10-20 DIAGNOSIS — D508 Other iron deficiency anemias: Secondary | ICD-10-CM | POA: Diagnosis present

## 2024-10-20 DIAGNOSIS — Z95811 Presence of heart assist device: Secondary | ICD-10-CM | POA: Insufficient documentation

## 2024-10-20 MED ORDER — SODIUM CHLORIDE 0.9 % IV SOLN
INTRAVENOUS | Status: AC
  Filled 2024-10-20: qty 17

## 2024-10-20 MED ORDER — SODIUM CHLORIDE 0.9 % IV SOLN
510.0000 mg | Freq: Once | INTRAVENOUS | Status: AC
  Administered 2024-10-20: 510 mg via INTRAVENOUS

## 2024-10-25 ENCOUNTER — Telehealth (HOSPITAL_COMMUNITY): Payer: Self-pay
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
## 2024-10-27 ENCOUNTER — Other Ambulatory Visit: Payer: Self-pay

## 2024-10-27 ENCOUNTER — Other Ambulatory Visit (HOSPITAL_COMMUNITY): Payer: Self-pay | Admitting: *Deleted

## 2024-10-27 DIAGNOSIS — Z7901 Long term (current) use of anticoagulants: Secondary | ICD-10-CM

## 2024-10-27 DIAGNOSIS — Z95811 Presence of heart assist device: Secondary | ICD-10-CM

## 2024-10-27 NOTE — Patient Outreach (Signed)
 Complex Care Management   Visit Note  10/27/2024  Name:  Jeffrey [name] MRN: [id number] DOB: 1976-03-15  Situation: Referral received for Complex Care Management related to Heart Failure I obtained verbal consent from Patient.  Visit completed with Patient  on the phone  Background:   Past Medical History:  Diagnosis Date   Anxiety    Aspirin  allergy    Childhood asthma    Chronic systolic CHF (congestive heart failure) (HCC)    a. EF 20-25% in 2012. b. EF 45-50% in 10/2011 with nonischemic nuc - presumed NICM. c. 12/2014 Echo: Sev depressed LV fxn, sev dil LV, mild LVH, mild MR, sev dil LA, mildly reduced RV fxn.   CKD (chronic kidney disease) stage 2, GFR 60-89 ml/min    Depression    H/O vasectomy 12/2019   High cholesterol    Hypertension    Morbid obesity (HCC)    Nephrolithiasis    OSA on CPAP    Paroxysmal atrial fibrillation (HCC)    Presumed NICM    a. 04/2014 Myoview : EF 26%, glob HK, sev glob HK, ? prior infarct;  b. Never cathed 2/2 CKD.   Renal cell carcinoma (HCC)    a. s/p Rt robotic assisted partial converted to radical nephrectomy on 01/2013.   Troponin level elevated    a. 04/2014, 12/2014: felt due to CHF.   Type II diabetes mellitus (HCC)    Ventricular tachycardia (HCC)    a. appropriate ICD therapy 12/2017    Assessment: Patient Reported Symptoms:  Cognitive Cognitive Status: Able to follow simple commands, Alert and oriented to person, place, and time, Normal speech and language skills Cognitive/Intellectual Conditions Management [RPT]: None reported or documented in medical history or problem list   Health Maintenance Behaviors: Annual physical exam Health Facilitated by: Healthy diet, Rest  Neurological Neurological Review of Symptoms: No symptoms reported    HEENT HEENT Symptoms Reported: No symptoms reported      Cardiovascular Cardiovascular Symptoms Reported: No symptoms reported Does patient have uncontrolled Hypertension?: Yes Is patient  checking Blood Pressure at home?: Yes Patient's Recent BP reading at home: 120/90 Cardiovascular Management Strategies: Medical device, Medication therapy, Weight management Do You Have a Working Readable Scale?: Yes Weight:  (Patient reported he did not weigh this morning. He denies swelling or recent weight gain)  Respiratory Respiratory Symptoms Reported: No symptoms reported Additional Respiratory Details: Patient denies lingering symptoms from flu Respiratory Management Strategies: CPAP, Medication therapy, Routine screening  Endocrine Endocrine Symptoms Reported: No symptoms reported Is patient diabetic?: Yes Is patient checking blood sugars at home?: Yes List most recent blood sugar readings, include date and time of day: 90 this morning    Gastrointestinal Gastrointestinal Symptoms Reported: No symptoms reported Additional Gastrointestinal Details: Patient reports appetite is back to normal      Genitourinary Genitourinary Symptoms Reported: No symptoms reported    Integumentary Integumentary Symptoms Reported: No symptoms reported    Musculoskeletal Musculoskelatal Symptoms Reviewed: No symptoms reported Musculoskeletal Management Strategies: Adequate rest Musculoskeletal Comment: Patient denies falls since previous CMRN visit Falls in the past year?: No Number of falls in past year: 1 or less Was there an injury with Fall?: No Fall Risk Category Calculator: 0 Patient Fall Risk Level: Low Fall Risk Patient at Risk for Falls Due to: No Fall Risks Fall risk Follow up: Falls evaluation completed, Education provided, Falls prevention discussed  Psychosocial Psychosocial Symptoms Reported: Not assessed          10/27/2024  PHQ2-9 Depression Screening   Little interest or pleasure in doing things    Feeling down, depressed, or hopeless    PHQ-2 - Total Score    Trouble falling or staying asleep, or sleeping too much    Feeling tired or having little energy    Poor  appetite or overeating     Feeling bad about yourself - or that you are a failure or have let yourself or your family down    Trouble concentrating on things, such as reading the newspaper or watching television    Moving or speaking so slowly that other people could have noticed.  Or the opposite - being so fidgety or restless that you have been moving around a lot more than usual    Thoughts that you would be better off dead, or hurting yourself in some way    PHQ2-9 Total Score    If you checked off any problems, how difficult have these problems made it for you to do your work, take care of things at home, or get along with other people    Depression Interventions/Treatment      Today's Vitals      Medications Reviewed Today     Reviewed by Arno Rosaline SQUIBB, RN (Registered Nurse) on 10/27/24 at 1346  Med List Status: <None>   Medication Order Taking? Sig Documenting Provider Last Dose Status Informant  acetaminophen  (TYLENOL ) 325 MG tablet [id number]  Take 2 tablets (650 mg total) by mouth every 6 (six) hours as needed for fever. Lenetta Greig BIRCH, NP  Active Self, Pharmacy Records           Med Note (SATTERFIELD, TEENA BRAVO   Thu Jun 29, 2024  5:35 PM)    allopurinol  (ZYLOPRIM ) 100 MG tablet [id number]  Take 1 tablet (100 mg total) by mouth daily. Bensimhon, Toribio SAUNDERS, MD  Active Self, Pharmacy Records  amiodarone  (PACERONE ) 200 MG tablet [id number]  Take 1 tablet (200 mg total) by mouth daily. Bensimhon, Toribio SAUNDERS, MD  Active   Blood Pressure KIT [id number]  1 Units by Does not apply route daily. Oley Bascom RAMAN, NP  Active Self, Pharmacy Records  cefadroxil  (DURICEF) 500 MG capsule [id number]  Take 1 capsule (500 mg total) by mouth 2 (two) times daily. Bensimhon, Toribio SAUNDERS, MD  Active Self, Pharmacy Records  colchicine  0.6 MG tablet [id number]  Take 1 tablet (0.6 mg total) by mouth once daily. Bensimhon, Toribio SAUNDERS, MD  Active Self, Pharmacy Records  cyanocobalamin  (VITAMIN B12) 1000 MCG tablet  [id number]  Take 1 tablet (1,000 mcg total) by mouth daily. Oley Bascom RAMAN, NP  Active Self, Pharmacy Records  cyclobenzaprine  (FLEXERIL ) 5 MG tablet [id number]  Take 1 tablet (5 mg total) by mouth 3 (three) times daily as needed for muscle spasms   Active Self, Pharmacy Records  EPINEPHrine  0.3 mg/0.3 mL IJ SOAJ injection [id number]  Inject 0.3 mL (0.3 mg total) into the thigh once for 1 dose as directed.   Active   ferrous sulfate  (FEROSUL) 325 (65 FE) MG tablet [id number]  Take 1 tablet (325 mg total) by mouth daily before breakfast. Nichols, Tonya S, NP  Active Self, Pharmacy Records  folic acid  (FOLVITE ) 800 MCG tablet [id number]  Take 0.5 tablets (400 mcg total) by mouth daily. Oley Bascom RAMAN, NP  Active Self, Pharmacy Records  gabapentin  (NEURONTIN ) 400 MG capsule [id number]  Take 1 capsule (400 mg total) by mouth 3 (three) times daily. Oley Bascom RAMAN, NP  Active  glucose blood test strip [id number]  Use as directed 4 times daily. Shannan Sia I, NP  Active Self, Pharmacy Records  isosorbide -hydrALAZINE  (BIDIL ) 20-37.5 MG tablet [id number]  Take 2 tablets by mouth 3 (three) times daily. Bensimhon, Toribio SAUNDERS, MD  Active Self, Pharmacy Records  meclizine  (ANTIVERT ) 25 MG tablet [id number]  Take 1 tablet (25 mg total) by mouth 3 (three) times daily as needed for dizziness. Bensimhon, Toribio SAUNDERS, MD  Active Self, Pharmacy Records  ondansetron  (ZOFRAN -ODT) 4 MG disintegrating tablet [id number]  Take 1 tablet (4 mg total) by mouth every 8 (eight) hours as needed for nausea or vomiting. Zenaida Morene PARAS, MD  Active Self, Pharmacy Records  oseltamivir  (TAMIFLU ) 75 MG capsule [id number]  Take 1 capsule (75 mg total) by mouth 2 (two) times daily.   Active   pantoprazole  (PROTONIX ) 40 MG tablet [id number]  Take 1 tablet (40 mg total) by mouth once daily. Bensimhon, Toribio SAUNDERS, MD  Active   promethazine -dextromethorphan  (PROMETHAZINE -DM) 6.25-15 MG/5ML syrup [id number]  Take 1-2 teaspoons every 6 hours as needed  for cough mainly at bedtime.  Do not take and drive.   Active   rosuvastatin  (CRESTOR ) 10 MG tablet [id number]  Take 1 tablet (10 mg total) by mouth daily. Bensimhon, Toribio SAUNDERS, MD  Active Self, Pharmacy Records  sacubitril -valsartan  (ENTRESTO ) 97-103 MG [id number]  Take 1 tablet by mouth 2 (two) times daily. Bensimhon, Toribio SAUNDERS, MD  Active   Semaglutide , 2 MG/DOSE, (OZEMPIC , 2 MG/DOSE,) 8 MG/3ML SOPN [id number]  Inject 2 mg as directed once a week. Oley Bascom RAMAN, NP  Active   sertraline  (ZOLOFT ) 50 MG tablet [id number]  Take 3 tablets (150 mg total) by mouth daily. Bensimhon, Toribio SAUNDERS, MD  Active   traZODone  (DESYREL ) 100 MG tablet [id number]  Take 1 tablet (100 mg total) by mouth at bedtime. Bensimhon, Toribio SAUNDERS, MD  Active Self, Pharmacy Records  warfarin (COUMADIN ) 4 MG tablet [id number]  Take 2 tablets by mouth daily (8 mg total)  or as directed by HF clinic Bensimhon, Toribio SAUNDERS, MD  Active             Recommendation:   Continue Current Plan of Care  Follow Up Plan:   No further follow up required: Patient's chronic conditions appear controlled at this time. He will continue to follow-up with LCSW for behavioral health needs. Patient advised to contact CMRN or request LCSW reach out to CMRN for future needs.  [name], RN MSN [place]  [facility] Population Health RN Care Manager Direct Dial: [phone]  Fax: [phone]
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
## 2024-11-02 ENCOUNTER — Ambulatory Visit (HOSPITAL_COMMUNITY): Payer: Self-pay | Admitting: Pharmacist

## 2024-11-02 ENCOUNTER — Ambulatory Visit (HOSPITAL_COMMUNITY)
Admission: RE | Admit: 2024-11-02 | Discharge: 2024-11-02 | Disposition: A | Discharge status: Home / Self Care | Source: Ambulatory Visit | Attending: Internal Medicine | Admitting: Internal Medicine

## 2024-11-02 DIAGNOSIS — Z7901 Long term (current) use of anticoagulants: Secondary | ICD-10-CM | POA: Diagnosis not present

## 2024-11-02 DIAGNOSIS — Z95811 Presence of heart assist device: Secondary | ICD-10-CM | POA: Diagnosis not present

## 2024-11-02 DIAGNOSIS — Z4509 Encounter for adjustment and management of other cardiac device: Secondary | ICD-10-CM | POA: Insufficient documentation

## 2024-11-02 LAB — PROTIME-INR
INR: 1.8 — ABNORMAL HIGH (ref 0.8–1.2)
Prothrombin Time: 22 s — ABNORMAL HIGH (ref 11.4–15.2)

## 2024-11-08 ENCOUNTER — Ambulatory Visit: Payer: Self-pay | Admitting: Nurse Practitioner

## 2024-11-10 ENCOUNTER — Other Ambulatory Visit: Payer: Self-pay | Admitting: Nurse Practitioner

## 2024-11-10 ENCOUNTER — Other Ambulatory Visit: Payer: Self-pay

## 2024-11-10 ENCOUNTER — Other Ambulatory Visit: Payer: Self-pay | Admitting: Licensed Clinical Social Worker

## 2024-11-10 ENCOUNTER — Other Ambulatory Visit (HOSPITAL_COMMUNITY): Payer: Self-pay | Admitting: Internal Medicine

## 2024-11-10 DIAGNOSIS — D649 Anemia, unspecified: Secondary | ICD-10-CM

## 2024-11-10 DIAGNOSIS — M1A9XX0 Chronic gout, unspecified, without tophus (tophi): Secondary | ICD-10-CM

## 2024-11-10 DIAGNOSIS — M109 Gout, unspecified: Secondary | ICD-10-CM

## 2024-11-10 MED ORDER — ALLOPURINOL 100 MG PO TABS
100.0000 mg | ORAL_TABLET | Freq: Every day | ORAL | 3 refills | Status: AC
  Filled 2024-11-10: qty 90, 90d supply, fill #0

## 2024-11-10 MED ORDER — COLCHICINE 0.6 MG PO TABS
0.6000 mg | ORAL_TABLET | Freq: Every day | ORAL | 6 refills | Status: AC
  Filled 2024-11-10: qty 30, 30d supply, fill #0

## 2024-11-10 MED ORDER — FERROUS SULFATE 325 (65 FE) MG PO TABS
325.0000 mg | ORAL_TABLET | Freq: Every day | ORAL | 1 refills | Status: AC
  Filled 2024-11-10: qty 90, 90d supply, fill #0

## 2024-11-10 NOTE — Patient Instructions (Signed)
 Visit Information  [name] was given information about Medicaid Managed Care team care coordination services as a part of their Healthy [facility] Medicaid benefit. Jeffrey [name]   If you would like to schedule transportation through your Healthy [facility] plan, please call the following number at least 2 days in advance of your appointment: [phone]  For information about your ride after you set it up, call Ride Assist at [phone]. Use this number to activate a Will Call pickup, or if your transportation is late for a scheduled pickup. Use this number, too, if you need to make a change or cancel a previously scheduled reservation.  If you need transportation services right away, call [phone]. The after-hours call center is staffed 24 hours to handle ride assistance and urgent reservation requests (including discharges) 365 days a year. Urgent trips include sick visits, hospital discharge requests and life-sustaining treatment.  Call the [facility] Line at [phone], at any time, 24 hours a day, 7 days a week. If you are in danger or need immediate medical attention call 911.   Please see education materials related to topics discussed provided by MyChart link.  Patient verbalizes understanding of instructions and care plan provided today and agrees to view in MyChart. Active MyChart status and patient understanding of how to access instructions and care plan via MyChart confirmed with patient.     Licensed Visual Merchandiser will call on 3/6 at 1 PM  Jeffrey Gillson, LCSW [place]  [facility], [facility] Clinical Social Worker Direct Dial: [phone]  Fax: [phone] Website: [email or web address] 6:59 PM   Following is a copy of your plan of care:   Goals Addressed             This Visit's Progress    LCSW VBCI Social Work Care Plan   On track    Problems:   Disease Management support and education needs related to  Anxiety and Depression (Seasonal)  CSW Clinical Goal(s):   Over the next 90 days the Patient will attend all scheduled medical appointments as evidenced by patient report and care team review of appointment completion in electronic MEDICAL RECORD NUMBER  demonstrate a reduction in symptoms related to Anxiety and Depression (Seasonal) .  Interventions:  Mental Health:  Evaluation of current treatment plan related to Anxiety and Depression (Seasonal) Active listening / Reflection utilized Financial Risk Analyst / information provided Discussed referral options to connect for ongoing therapy: Patient was referred to [facility]; however, referral was closed without pt establishing. LCSW will collaborate with PCP about completing new referral 2/6: Pt will address with PCP at upcoming appt. Pt states symptoms have decreased since last he spoke with LCSW Emotional Support Provided Mindfulness or Relaxation training provided Provided general psycho-education for mental health needs Quality of sleep assessed & Sleep Hygiene techniques promoted Reviewed mental health medications and discussed importance of compliance: Pt reports compliance Solution-Focued Strategies employed:LCSW provided listing of local counseling resources via email 1/2: Pt is interested in establishing with a counselor through [facility] Suicidal Ideation/Homicidal Ideation assessed: No report of SI/HI  Patient Goals/Self-Care Activities:  Continue taking your medication as prescribed for the next 30 days Continue practicing healthy coping skills to promote positive mood and stress reduction  Attend upcoming medical appts discussed  Plan:   Telephone follow up appointment with care management team member scheduled for:  4 weeks

## 2024-11-10 NOTE — Patient Outreach (Signed)
 Complex Care Management   Visit Note  11/10/2024  Name:  Jeffrey [name] MRN: [id number] DOB: [birth year]  Situation: Referral received for Complex Care Management related to Mental/Behavioral Health diagnosis Anxiety and Depression (seasonal) I obtained verbal consent from Patient.  Visit completed with Patient  on the phone  Background:   Past Medical History:  Diagnosis Date   Anxiety    Aspirin  allergy    Childhood asthma    Chronic systolic CHF (congestive heart failure) (HCC)    a. EF 20-25% in 2012. b. EF 45-50% in 10/2011 with nonischemic nuc - presumed NICM. c. 12/2014 Echo: Sev depressed LV fxn, sev dil LV, mild LVH, mild MR, sev dil LA, mildly reduced RV fxn.   CKD (chronic kidney disease) stage 2, GFR 60-89 ml/min    Depression    H/O vasectomy 12/2019   High cholesterol    Hypertension    Morbid obesity (HCC)    Nephrolithiasis    OSA on CPAP    Paroxysmal atrial fibrillation (HCC)    Presumed NICM    a. 04/2014 Myoview : EF 26%, glob HK, sev glob HK, ? prior infarct;  b. Never cathed 2/2 CKD.   Renal cell carcinoma (HCC)    a. s/p Rt robotic assisted partial converted to radical nephrectomy on 01/2013.   Troponin level elevated    a. 04/2014, 12/2014: felt due to CHF.   Type II diabetes mellitus (HCC)    Ventricular tachycardia (HCC)    a. appropriate ICD therapy 12/2017    Assessment: Patient Reported Symptoms:  Cognitive Cognitive Status: No symptoms reported, Alert and oriented to person, place, and time, Normal speech and language skills Cognitive/Intellectual Conditions Management [RPT]: None reported or documented in medical history or problem list   Health Maintenance Behaviors: Annual physical exam  Neurological Neurological Review of Symptoms: Not assessed    HEENT HEENT Symptoms Reported: Not assessed      Cardiovascular Cardiovascular Symptoms Reported: Not assessed    Respiratory Respiratory Symptoms Reported: Not assesed    Endocrine Endocrine  Symptoms Reported: Not assessed    Gastrointestinal Gastrointestinal Symptoms Reported: Not assessed      Genitourinary Genitourinary Symptoms Reported: Not assessed    Integumentary Integumentary Symptoms Reported: Not assessed    Musculoskeletal Musculoskelatal Symptoms Reviewed: Not assessed        Psychosocial Psychosocial Symptoms Reported: No symptoms reported Behavioral Management Strategies: Adequate rest, Support system, Coping strategies, Medication therapy Major Change/Loss/Stressor/Fears (CP): Medical condition, self Techniques to Cope with Loss/Stress/Change: Diversional activities, Medication      11/10/2024    PHQ2-9 Depression Screening   Little interest or pleasure in doing things    Feeling down, depressed, or hopeless    PHQ-2 - Total Score    Trouble falling or staying asleep, or sleeping too much    Feeling tired or having little energy    Poor appetite or overeating     Feeling bad about yourself - or that you are a failure or have let yourself or your family down    Trouble concentrating on things, such as reading the newspaper or watching television    Moving or speaking so slowly that other people could have noticed.  Or the opposite - being so fidgety or restless that you have been moving around a lot more than usual    Thoughts that you would be better off dead, or hurting yourself in some way    PHQ2-9 Total Score    If you checked off  any problems, how difficult have these problems made it for you to do your work, take care of things at home, or get along with other people    Depression Interventions/Treatment      There were no vitals filed for this visit.    Medications Reviewed Today     Reviewed by Vannie Hochstetler D, LCSW (Social Worker) on 11/10/24 at 1855  Med List Status: <None>   Medication Order Taking? Sig Documenting Provider Last Dose Status Informant  acetaminophen  (TYLENOL ) 325 MG tablet [id number]  Take 2 tablets (650 mg total) by  mouth every 6 (six) hours as needed for fever. Lenetta Greig BIRCH, NP  Active Self, Pharmacy Records           Med Note (SATTERFIELD, TEENA BRAVO   Thu Jun 29, 2024  5:35 PM)    allopurinol  (ZYLOPRIM ) 100 MG tablet [id number]  Take 1 tablet (100 mg total) by mouth daily. Bensimhon, Toribio SAUNDERS, MD  Active   amiodarone  (PACERONE ) 200 MG tablet [id number]  Take 1 tablet (200 mg total) by mouth daily. Bensimhon, Toribio SAUNDERS, MD  Active   Blood Pressure KIT [id number]  1 Units by Does not apply route daily. Oley Bascom RAMAN, NP  Active Self, Pharmacy Records  cefadroxil  (DURICEF) 500 MG capsule [id number]  Take 1 capsule (500 mg total) by mouth 2 (two) times daily. Bensimhon, Toribio SAUNDERS, MD  Active Self, Pharmacy Records  colchicine  0.6 MG tablet [id number]  Take 1 tablet (0.6 mg total) by mouth once daily. Bensimhon, Toribio SAUNDERS, MD  Active   cyanocobalamin  (VITAMIN B12) 1000 MCG tablet [id number]  Take 1 tablet (1,000 mcg total) by mouth daily. Oley Bascom RAMAN, NP  Active Self, Pharmacy Records  cyclobenzaprine  (FLEXERIL ) 5 MG tablet [id number]  Take 1 tablet (5 mg total) by mouth 3 (three) times daily as needed for muscle spasms   Active Self, Pharmacy Records  EPINEPHrine  0.3 mg/0.3 mL IJ SOAJ injection [id number]  Inject 0.3 mL (0.3 mg total) into the thigh once for 1 dose as directed.   Active   ferrous sulfate  (FEROSUL) 325 (65 FE) MG tablet [id number]  Take 1 tablet (325 mg total) by mouth daily before breakfast. Nichols, Tonya S, NP  Active   folic acid  (FOLVITE ) 800 MCG tablet [id number]  Take 0.5 tablets (400 mcg total) by mouth daily. Oley Bascom RAMAN, NP  Active Self, Pharmacy Records  gabapentin  (NEURONTIN ) 400 MG capsule [id number]  Take 1 capsule (400 mg total) by mouth 3 (three) times daily. Oley Bascom RAMAN, NP  Active   glucose blood test strip [id number]  Use as directed 4 times daily. Shannan Sia I, NP  Active Self, Pharmacy Records  isosorbide -hydrALAZINE  (BIDIL ) 20-37.5 MG tablet [id number]  Take 2 tablets by  mouth 3 (three) times daily. Bensimhon, Toribio SAUNDERS, MD  Active Self, Pharmacy Records  meclizine  (ANTIVERT ) 25 MG tablet [id number]  Take 1 tablet (25 mg total) by mouth 3 (three) times daily as needed for dizziness. Bensimhon, Toribio SAUNDERS, MD  Active Self, Pharmacy Records  ondansetron  (ZOFRAN -ODT) 4 MG disintegrating tablet [id number]  Take 1 tablet (4 mg total) by mouth every 8 (eight) hours as needed for nausea or vomiting. Zenaida Morene PARAS, MD  Active Self, Pharmacy Records  oseltamivir  (TAMIFLU ) 75 MG capsule [id number]  Take 1 capsule (75 mg total) by mouth 2 (two) times daily.   Active   pantoprazole  (PROTONIX ) 40 MG tablet [id number]  Take 1 tablet (40 mg total) by mouth once daily. Bensimhon, Toribio  R, MD  Active   promethazine -dextromethorphan  (PROMETHAZINE -DM) 6.25-15 MG/5ML syrup [id number]  Take 1-2 teaspoons every 6 hours as needed for cough mainly at bedtime.  Do not take and drive.   Active   rosuvastatin  (CRESTOR ) 10 MG tablet [id number]  Take 1 tablet (10 mg total) by mouth daily. Bensimhon, Toribio SAUNDERS, MD  Active Self, Pharmacy Records  sacubitril -valsartan  (ENTRESTO ) 97-103 MG [id number]  Take 1 tablet by mouth 2 (two) times daily. Bensimhon, Toribio SAUNDERS, MD  Active   Semaglutide , 2 MG/DOSE, (OZEMPIC , 2 MG/DOSE,) 8 MG/3ML SOPN [id number]  Inject 2 mg as directed once a week. Oley Bascom RAMAN, NP  Active   sertraline  (ZOLOFT ) 50 MG tablet [id number]  Take 3 tablets (150 mg total) by mouth daily. Bensimhon, Toribio SAUNDERS, MD  Active   traZODone  (DESYREL ) 100 MG tablet [id number]  Take 1 tablet (100 mg total) by mouth at bedtime. Bensimhon, Toribio SAUNDERS, MD  Active Self, Pharmacy Records  warfarin (COUMADIN ) 4 MG tablet [id number]  Take 2 tablets by mouth daily (8 mg total)  or as directed by HF clinic Bensimhon, Toribio SAUNDERS, MD  Active             Recommendation:   Continue Current Plan of Care  Follow Up Plan:   Telephone follow-up in 1 month  Rolin Kerns, LCSW [facility]  [facility], [facility] Clinical Social Worker Direct Dial: [phone]  Fax: [phone] Website: [email or web address] 6:59 PM

## 2024-11-14 ENCOUNTER — Other Ambulatory Visit: Payer: Self-pay

## 2024-11-16 ENCOUNTER — Ambulatory Visit (HOSPITAL_COMMUNITY): Admitting: Cardiology

## 2024-11-30 ENCOUNTER — Ambulatory Visit: Payer: Self-pay | Admitting: Nurse Practitioner

## 2024-12-08 ENCOUNTER — Telehealth: Admitting: Licensed Clinical Social Worker
